# Patient Record
Sex: Female | Born: 1955 | Race: White | Hispanic: No | Marital: Married | Smoking: Former smoker
Health system: Southern US, Community
[De-identification: ages and names within clinical notes are randomized; demographics above are authoritative.]

## PROBLEM LIST (undated history)

## (undated) DIAGNOSIS — S81809A Unspecified open wound, unspecified lower leg, initial encounter: Secondary | ICD-10-CM

## (undated) DIAGNOSIS — Z9889 Other specified postprocedural states: Secondary | ICD-10-CM

## (undated) DIAGNOSIS — Z972 Presence of dental prosthetic device (complete) (partial): Secondary | ICD-10-CM

## (undated) DIAGNOSIS — R112 Nausea with vomiting, unspecified: Secondary | ICD-10-CM

## (undated) DIAGNOSIS — I739 Peripheral vascular disease, unspecified: Secondary | ICD-10-CM

## (undated) DIAGNOSIS — T7840XA Allergy, unspecified, initial encounter: Secondary | ICD-10-CM

## (undated) DIAGNOSIS — S91009A Unspecified open wound, unspecified ankle, initial encounter: Secondary | ICD-10-CM

## (undated) DIAGNOSIS — Z98811 Dental restoration status: Secondary | ICD-10-CM

## (undated) HISTORY — PX: ANKLE FRACTURE SURGERY: SHX122

## (undated) HISTORY — DX: Unspecified open wound, unspecified lower leg, initial encounter: S81.809A

## (undated) HISTORY — PX: HARDWARE REMOVAL: SHX979

## (undated) HISTORY — PX: VEIN LIGATION AND STRIPPING: SHX2653

## (undated) HISTORY — DX: Allergy, unspecified, initial encounter: T78.40XA

## (undated) HISTORY — PX: TONSILLECTOMY: SUR1361

## (undated) HISTORY — PX: OTHER SURGICAL HISTORY: SHX169

---

## 1994-06-02 HISTORY — PX: ECTOPIC PREGNANCY SURGERY: SHX613

## 2001-12-16 ENCOUNTER — Emergency Department (HOSPITAL_COMMUNITY): Admission: EM | Admit: 2001-12-16 | Discharge: 2001-12-16 | Payer: Self-pay | Admitting: Emergency Medicine

## 2007-03-24 ENCOUNTER — Encounter (HOSPITAL_BASED_OUTPATIENT_CLINIC_OR_DEPARTMENT_OTHER): Admission: RE | Admit: 2007-03-24 | Discharge: 2007-05-31 | Payer: Self-pay | Admitting: Surgery

## 2007-06-01 ENCOUNTER — Encounter (HOSPITAL_BASED_OUTPATIENT_CLINIC_OR_DEPARTMENT_OTHER): Admission: RE | Admit: 2007-06-01 | Discharge: 2007-08-12 | Payer: Self-pay | Admitting: Surgery

## 2010-07-04 ENCOUNTER — Other Ambulatory Visit: Payer: Self-pay | Admitting: General Surgery

## 2010-07-04 DIAGNOSIS — R1030 Lower abdominal pain, unspecified: Secondary | ICD-10-CM

## 2010-07-12 ENCOUNTER — Ambulatory Visit
Admission: RE | Admit: 2010-07-12 | Discharge: 2010-07-12 | Disposition: A | Discharge status: Home / Self Care | Payer: 59 | Source: Ambulatory Visit | Attending: General Surgery | Admitting: General Surgery

## 2010-07-12 DIAGNOSIS — R1030 Lower abdominal pain, unspecified: Secondary | ICD-10-CM

## 2010-07-12 MED ORDER — IOHEXOL 300 MG/ML  SOLN: 100.0000 mL | Freq: Once | INTRAMUSCULAR | Status: AC | PRN

## 2010-10-15 NOTE — Assessment & Plan Note (Signed)
[facility]   NAME:  [name], [name]               ACCOUNT NO.:  [id number]   MEDICAL RECORD NO.:  [id number]      DATE OF BIRTH:  [birth year]   PHYSICIAN:  Epifania Gore. Nils Pyle, M.D. VISIT DATE:  03/26/2007                                   OFFICE VISIT   REASON FOR CONSULTATION:  [name] is a 55 year old female, referred by  Dr. Renaldo Reel for evaluation of the recalcitrant ulcer of the left  lower extremity.   IMPRESSION:  Stasis ulcer.   RECOMMENDATIONS:  Continue external compression for wound preparation  followed by the placement of an Apligraf.   SUBJECTIVE:  [name] is a 55 year old who has had a long history of  venous disease associated with ulcerations.  Her history dates back to  2005.  She has had several ulcers which have responded to external  compression.  She has had vein stripping as well as sclerotherapy in the  management of her recurrent ulcerations.  She has also worn custom-  fitted hose.   The current ulcer has been present since April 2008.  She has been  treated with Unna wraps, oral and topical antibiotics, as well as  sonographically-directed sclera therapy.  In spite of these efforts, the  wound has continued to increase.  She is troubled by moderate pain and  immobility in the ankle, giving rise to instability and gait.  She she  has moderate pain which is well controlled with p.o. analgesia.  There  has been no interim trauma.   PAST MEDICAL HISTORY:  Remarkable for ALLERGIES TO PENICILLIN which  gives a hives.  Her current medication list includes  400 mg 1 t.i.d.,  vitamin C, fish oil daily, aspirin 81 mg daily and Percocet p.r.n. for  pain.   PAST SURGERIES:  Included a tubal pregnancy evacuation, a tonsillectomy,  and vein-stripping of the left lower extremity.   FAMILY HISTORY:  Positive for COPD, cancer and diabetes.   SOCIAL HISTORY:  She is married.  She has 2 stepchildren.  She lives in  [facility] and is  employed.   REVIEW OF SYSTEMS:  The patient is a smoker.  She does not have a  chronic cough.  She denies visual changes, speech impairments or  transient paralysis.  She has no heat or cold intolerance.  No  polydipsia, polyphagia, polyuria.  There are no cardiorespiratory  symptoms.  There are no GI, GU complaints.  The remainder of her review  of systems is negative.   PHYSICAL EXAMINATION:  She is an alert, oriented female in no acute  distress.  She is in good contact with reality and responds  appropriately to historical inquiry.  Blood pressure is 116/71, pulse rate of 56, respirations 18, temperature  is 97.7.  HEENT:  Exam is clear.  NECK:  Supple.  Trachea is midline.  Thyroid is nonpalpable.  LUNGS:  Clear.  HEART:  Sounds were normal.  ABDOMEN:  Soft.  EXTREMITY EXAM:  Abnormal with 2+ edema in the left lower extremity,  associated with moderate changes of stasis including hyperpigmentation.  The pedal pulse is 3+ bilaterally.  The patient retains protective  sensation as measured by the Semmes-Weinstein filament.  The ulceration  is in the classic stasis position,  and the left medial malleolus  measures 4 cm in its widest diameter and is approximately 0.2 cm deep.  It appears to be clean with a clear-serous drainage.  There is a halo of  erythema, but there is no ascending lymphangitis or cellulitis.  The  calf is nontender.   DISCUSSION:  [name] has a long history of a stasis ulceration,  treated with multiple modalities and pain-ablative therapy.  On her  physical exam, there is no evidence of ropy varicosities of venous  hypertension.  The wound appears to be normal although it has been  present for quite some time.  We will proceed with reapplying the Unna  boot we will request insurance precertification for Apligraf procedure.  We will reevaluate the patient in 1 week, and if there is stabilization  of the wound, we will proceed with Apligraf.  If there is  any  deterioration whatsoever from today's exam, we will proceed with a  biopsy to rule out concurrent malignancy.  We have explained this  approach to the patient in terms that she seems to understand.  She  expresses gratitude for having been seen in the clinic and indicates  that she will be compliant.  She will continue to wear the compressive  wrap, and will employee elevation of the leg to avoid edema as much as  possible      Harold A. Nils Pyle, M.D.  Electronically Signed     HAN/MEDQ  D:  03/26/2007  T:  03/27/2007  Job:  [id number]   cc:   Landry Corporal, M.D.

## 2010-10-15 NOTE — Consult Note (Signed)
NAMEMarland Kitchen  [name], [name]               ACCOUNT NO.:  [id number]   MEDICAL RECORD NO.:  [id number]          PATIENT TYPE:  REC   LOCATION:  FOOT                         FACILITY:  [facility]   PHYSICIAN:  Orlando Penner. Sevier, M.D. DATE OF BIRTH:  Oct 29, 1955   DATE OF CONSULTATION:  04/21/2007  DATE OF DISCHARGE:                                 CONSULTATION   HISTORY:  This 55 year old white female is being followed for a venous  ulceration just anterior to the medial malleolus on the medial aspect of  her right foot.  She has had venous ablation therapy in that area which  was performed by injection since the beginning of the ulceration.  That  did not result in much improvement, nor have standard treatments here,  and accordingly 2 weeks ago she had application of an Apligraf.  Things  were satisfactory a week ago when the initial partial redressing was  done and today the wound itself evaluated for the first time.  She  reports no increased pain, no increased drainage, no odor, no fever or  systemic symptoms.  She had complained about tightness in her Achilles  tendon as a result of longstanding wraps and since her visit here 2  weeks ago she has been using a sash or belt to passively put that ankle  through range of motion and reports that it is improving.   EXAMINATION:  Blood pressure 112/55, pulse 60, respirations 16,  temperature 97.5.  The ulcer on the left medial hind foot measures 1.7 x  3.5 x 0.1 cm which is essentially unchanged from before.  There is a  little bit of yellowish exudate in the wound base.  No definite  epithelial islands but possibly some epithelial advancement from the  margin of the wound in approximately the 8-9 o'clock position.   IMPRESSION:  A satisfactory course of stasis wound but uncertain benefit  from recent Apligraf.   DISPOSITION:  Because of the recent application of the Apligraf I am  loath to debride the base of the wound today until we can give it  a  little bit additional time for possible epithelialization.  Accordingly,  the wound is dressed with a Mepilex and a Telfa pad and the extremity  returned to an Unna wrap.   She is to be allowed to continue her heel cord stretching exercises.   Followup visit will be here in 12 days, sooner p.r.n.           ______________________________  Orlando Penner. London Pepper, M.D.     RES/MEDQ  D:  04/21/2007  T:  04/21/2007  Job:  [id number]

## 2010-10-15 NOTE — Assessment & Plan Note (Signed)
[facility]   NAME:  LITAL, ZAPP               ACCOUNT NO.:  [id number]   MEDICAL RECORD NO.:  [id number]      DATE OF BIRTH:  [birth year]   PHYSICIAN:  Epifania Gore. Nils Pyle, M.D. VISIT DATE:  05/17/2007                                   OFFICE VISIT   SUBJECTIVE:  [name] is a 55 year old female who we are following for  a stasis ulceration involving the left lower extremity.  The patient  underwent placement of an Apligraf several weeks ago followed by  compression wrap therapy and most recently she has had Prisma dressing  applied.  She is having a moderate amount of drainage with decreased  range of motion in the ankle.  There has been no fever.  She continues  to be ambulatory.  She has had an interim duplex scan by [facility] which has not shown deep venous thrombosis.   OBJECTIVE:  Blood pressure is 107/61, respirations are 20, pulse rate  70, temperature is 97.6.  Inspection of the left lower extremity shows  that there has been definite increase in the size of the wound, the  measurements are 3.0 cm in the greatest diameter.  There is involution  of the previous Apligraf with a hyperemic response.  The pedal pulse  remains 3+, there is associated 2+ edema.  There is no ascending  cellulitis or lymphangitis.   ASSESSMENT:  Stasis ulceration retrogression.   PLAN:  We have cultured the wound, we are placing the patient in an Unna  wrap.  We will reevaluate her in 7-10 days.  We have instructed her  specifically to keep the Unna wrap in place.  If she develops swelling  she is to raise the leg above her heart for 15 minutes, if there is no  relief she is to completely remove the wrap and call the clinic for a  priority appointment to reapply her wrap.  We have given the patient an  opportunity to ask questions, she seems to understand and indicates that  she will be compliant.  We have extended this patient's period of  disability until  after June 03, 2007.      Harold A. Nils Pyle, M.D.  Electronically Signed     HAN/MEDQ  D:  05/17/2007  T:  05/17/2007  Job:  [id number]

## 2010-10-15 NOTE — Assessment & Plan Note (Signed)
[facility]   NAME:  [name], [name]               ACCOUNT NO.:  [id number]   MEDICAL RECORD NO.:  [id number]      DATE OF BIRTH:  01/05/1956   PHYSICIAN:  [name], M.D. VISIT DATE:  04/02/2007                                   OFFICE VISIT   DESCRIPTION OF TODAY'S VISIT:  [name] is a 55 year old woman who is  seen today for evaluation of a recalcitrant ulcer on her left lower  extremity.  She was seen here on October 24.  She underwent external  compression Silverlon.  She has tolerated this reasonably well.  She  also underwent prior approval for an Apligraf.  We have this present.  She will have $100 copay, but otherwise, this will be covered, and I  have discussed this with her.   PHYSICAL EXAMINATION:  VITAL SIGNS:  Temperature is 98.5, pulse 58,  respirations 16, blood pressure 116/57.  She is not a diabetic.  The  area in question is on her left medial ankle.  Measuring 1.7 x 3.3 x  0.1.  This is somewhat improved from last time.  The base of this looks  granulated.  There is scant epithelialization present and not excessive  amounts of slough.  There is erythema around the wound that seems new  from last time.  However, this was not tender, not warm.  She wondered  whether this could be a reaction to the Silverlon.  Her peripheral  pulses are intact.  She discussed in detail the treatment that has been  prescribed through the vein center and the physician that was treating  her there.   IMPRESSION:  1. Predominately refractory venous stasis ulcer.  In view of the      possible reaction to the Silverlon, I have put Chromogranin the      base this wound and reapplied an Unna wrap.  We will proceed with      an Apligraf order which should be done in the early part of next      week.  I have discussed this in detail with the patient.  We will      call her at that time for her Apligraf application next week.     ______________________________  [name], M.D.     MGR/MEDQ  D:  04/02/2007  T:  04/02/2007  Job:  [id number]

## 2010-10-15 NOTE — Assessment & Plan Note (Signed)
[facility]   NAME:  [name], [name]               ACCOUNT NO.:  [id number]   MEDICAL RECORD NO.:  [id number]      DATE OF BIRTH:  1956-03-29   PHYSICIAN:  Epifania Gore. Nils Pyle, M.D. VISIT DATE:  07/19/2007                                   OFFICE VISIT   SUBJECTIVE:  [name] is a 55 year old lady who we have followed in the  [place] since October 2008.  We have treated her with for a stasis  ulceration involving the left medial ankle.  She has been treated with  continuous adequate external compression with initial improvement.  She  has had cultures which have been specifically treated with antibiotics  and most recently she has undergone an Apligraf placement with initial  decrease in the area of the ulcer.  She was returned to work 2 weeks ago  with an instruction to continue external compression.  She reported some  discomfort at work but persisted.  She continues to wear external  compression.  There has been no interim fever.  There is been no  excessive drainage.   OBJECTIVE:  Blood pressure is 102/75, respirations 16, pulse rate 54,  temperature is 98.  Inspection of the left lower extremity shows that the ulcer itself has  increased in area.  There remains a granulating base with some scant  appearance of epithelium.  There is no ascending infection.  There is no  malodor. There are no signs of ischemia.  There is no need for  debridement.   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   RECOMMENDATIONS:  We have instructed the patient to make a follow-up  appointment with Dr. Renaldo Reel for evaluation of the venous  hypertension attributed to incompetent perforations. I have discussed  this patient with Dr. Renaldo Reel today. We have suggested to the  patient that she may need further ablative in an effort to control her  venous hypertension.  Our efforts to  date have shown that she clinicaly  responsed to external compression but this response is not durable in  the context of her employment requirements.  We have given her an  opportunity to ask questions.  She seems to understand our assessment  and has indicated that she will requests the followup return with Dr.  Renaldo Reel.  In the interim, if her the compressive wrap becomes soiled  or uncomfortable,  she is to call the [facility].  We will be happy to  change her dressing and evaluate her until she can be seen again by Dr.  Renaldo Reel.  The patient expresses gratitude for having been seen in  the clinic.  We are discharging her from active management in the [facility] at this time. Her care is to be re-assumed by Dr. Renaldo Reel.      Harold A. Nils Pyle, M.D.  Electronically Signed     HAN/MEDQ  D:  07/19/2007  T:  07/19/2007  Job:  [id number]   cc:   Landry Corporal, M.D.

## 2010-10-15 NOTE — Assessment & Plan Note (Signed)
[facility]   NAME:  RYLIEE, MEHALL               ACCOUNT NO.:  [id number]   MEDICAL RECORD NO.:  [id number]      DATE OF BIRTH:  04-Mar-1956   PHYSICIAN:  Epifania Gore. Nils Pyle, M.D. VISIT DATE:  05/24/2007                                   OFFICE VISIT   SUBJECTIVE:  Ms. Benevides is a 55 year old lady who we are treating for a  stasis ulcer.  In the interim she has worn an Haematologist.  She has  complained of some cramping. She remains ambulatory.   OBJECTIVE:  Blood pressure 103/61, respirations 18, pulse rate 55,  temperature is 98.2.  Inspection of the left medial ankle shows that the ulcer has decreased  in volume.  There is a minor area of ecchymosis at the proximal portion  of the Unna wrap.  There is no violation of the skin, however.  There is  no evidence of wrap injury. The pedal pulse remains palpable.   ASSESSMENT:  Clinical response to adequate compression of the stasis  ulcer.   PLAN:  We will resume an Unna boot compression therapy and reevaluate  the patient in 1 week p.r.n.      Harold A. Nils Pyle, M.D.  Electronically Signed     HAN/MEDQ  D:  05/24/2007  T:  05/24/2007  Job:  [id number]

## 2010-10-15 NOTE — Assessment & Plan Note (Signed)
[facility]   NAME:  [name], [name]               ACCOUNT NO.:  [id number]   MEDICAL RECORD NO.:  [id number]      DATE OF BIRTH:  [birth year]   PHYSICIAN:  Orlando Penner. Sevier, M.D.  VISIT DATE:  04/07/2007                                   OFFICE VISIT   HISTORY:  This 55 year old white female is seen for refractory venous  ulcer on the medial aspect of the left heel just distal to the lateral  malleolar area.  This has been treated with applications of Unna wrap  and other topical measures and has come to a point of stability with the  plan out to move to Apligraf application.   The patient reports no significant change that she can see in the  interim.  She does note that she is experiencing some tightening of the  Achilles tendon on that side in conjunction with her wraps.   EXAMINATION:  Today blood pressure 98/58, pulse 63 regular, respirations  16, temperature 98.4.  Indeed there is some loss of extension in the tendo Achilles on the left  which can only partially be passively overcome at this point. The  previously described ulcer is seen today 3.2 x 1.7 x 0.1 cm in dimension  and has a nice clean granular base.  There is some crusting around the  margins.   IMPRESSION:  Slow improvement and stability of wound with wound base  ready for Apligraf application.   DISPOSITION:  1. The crusts around the margin of the wound are selectively debrided      without difficulty with no bleeding.  2. Using standard and sterile procedures the Apligraf is applied to      the wound and anchored in place with Steri-Strips.  It is covered      with Mepilex dressing and extremity is returned to Profore light      wrap.   The patient is instructed how to use a belt or sash of some sort to  place under her forefoot when she is sitting inactive and to gradually  work on dorsiflexing the foot passively in that manner so as to lengthen  the tendo Achilles.   Follow-up  visit will be here in 5 days for initial wound redressing and  then in 7 days thereafter for wound unveiling.           ______________________________  Orlando Penner. London Pepper, M.D.     RES/MEDQ  D:  04/07/2007  T:  04/07/2007  Job:  [id number]

## 2010-10-15 NOTE — Assessment & Plan Note (Signed)
[facility]   NAME:  ISAI, ATANACIO               ACCOUNT NO.:  [id number]   MEDICAL RECORD NO.:  [id number]      DATE OF BIRTH:  [birth year]   PHYSICIAN:  Epifania Gore. Nils Pyle, M.D. VISIT DATE:  07/12/2007                                   OFFICE VISIT   SUBJECTIVE:  Ms. Esser is a 55 year old female who we have followed for  a stasis ulcer involving her left medial leg.  In the interim she has  returned to work.  She has had mild pain requiring two Vicodin over the  last week.  She did complain of some minor swelling which receded  overnight.  There has been no fever.  There has been no excessive  drainage or malodor.   OBJECTIVE:  VITALS:  Blood pressure is 109/56, respirations 16, pulse  rate 55, temperature 98.3.  EXTREMITIES:  Inspection of the left medial ankle shows that the ulcer  is clean, but there is no significant epithelial advancement.  There is  no evidence of active infection.  The pedal pulse remains palpable.   ASSESSMENT:  Static stasis ulcer.   PLAN:  We will return the patient to an Haematologist.  She is encouraged to  continue her work as tolerated.  If this wound shows no improvement over  the next week. we will refer her for vascular surgery evaluation of a  deeper system with attention toward the possible consideration of vein  ablation therapy.  We will reevaluate the patient in 1 week.      Harold A. Nils Pyle, M.D.  Electronically Signed     HAN/MEDQ  D:  07/12/2007  T:  07/12/2007  Job:  [id number]

## 2010-10-15 NOTE — Assessment & Plan Note (Signed)
[facility]   NAME:  MARIJO, BARTEN               ACCOUNT NO.:  [id number]   MEDICAL RECORD NO.:  [id number]      DATE OF BIRTH:  1956/02/06   PHYSICIAN:  Epifania Gore. Nils Pyle, M.D. VISIT DATE:  07/05/2007                                   OFFICE VISIT   SUBJECTIVE:  Ms. Anselmo is a 55 year old female who we have followed for  stasis ulcer for several weeks.  In the interim she has been wearing an  Unna wrap.  She continues to be ambulatory.  There has been no excessive  drainage, malodor, pain or fever.   OBJECTIVE:  VITALS:  Blood pressure is 102/54, respirations 16, pulse  rate 52, temperature is 98.1.  Inspection of the left medial ankle shows  that the ulcer is 100% granulated with healthy appearing periphery and  advancing epithelium.  There is no evidence of ascending infection.  Pedal pulses palpable.   ASSESSMENT:  Satisfactory response to compression.   PLAN:  We are returning the patient to work as of 07/05/07.  She will  require weekly follow-ups for Unna boot changes.  We have given her a  prescription for Vicodin to take 1-2 every 6-8 hours p.r.n. for pain.  She is to keep her leg elevated as much as possible. If she has  difficulty with work, she will return for an interim evaluation and  reevaluation of her work status.      Harold A. Nils Pyle, M.D.  Electronically Signed     HAN/MEDQ  D:  07/05/2007  T:  07/05/2007  Job:  [id number]

## 2010-10-15 NOTE — Assessment & Plan Note (Signed)
[facility]   NAME:  [name], [name]               ACCOUNT NO.:  [id number]   MEDICAL RECORD NO.:  [id number]      DATE OF BIRTH:  [birth year]   PHYSICIAN:  Epifania Gore. Nils Pyle, M.D. VISIT DATE:  06/21/2007                                   OFFICE VISIT   SUBJECTIVE:  [name] is a 55 year old female who we are following for  a stasis ulcer on the medial aspect of her left lower extremity.  In the  interim, she continues to be ambulatory.  She is wearing an Haematologist.  There has been no excessive drainage, malodor, pain, or fever.   OBJECTIVE:  Blood pressure is 114/84, respirations are 16, pulse rate  55, temperature is 98.4.  Inspection of the left lower extremity shows a  well perfused extremity with a contracting stasis ulcer.  There is no  excessive drainage or malodor.  There is no evidence of infection.  The  edema is 1+. ( refer to the data entry)   ASSESSMENT:  Improved stasis ulcer.   PLAN:  We will reapply an Unna boot and reevaluate the patient in 1  week.      Harold A. Nils Pyle, M.D.  Electronically Signed     HAN/MEDQ  D:  06/21/2007  T:  06/21/2007  Job:  [id number]

## 2010-10-15 NOTE — Assessment & Plan Note (Signed)
[facility]   NAME:  [name], [name]               ACCOUNT NO.:  [id number]   MEDICAL RECORD NO.:  [id number]      DATE OF BIRTH:  [birth year]   PHYSICIAN:  Epifania Gore. Nils Pyle, M.D. VISIT DATE:  06/14/2007                                   OFFICE VISIT   SUBJECTIVE:  [name] returns for followup with a stasis ulcer  involving the left medial ankle.  In the interim she has worn an IT consultant.  There has been no excessive drainage, malodor, pain or fever.   OBJECTIVE:  Blood pressure is 96/44, respirations 16, pulse rate 57,  temperature 98.1.  Inspection of the medical aspect of the right lower  extremity shows that there is a moderate amount of desquamation,  necrosis in the periphery.  Under EMLA block this wound was sharply  debrided, an excisional debridement was performed using both a curette  and a 10 blade.  Subcutaneous tissue, skin necrosis and desquamation  were removed without difficulty.  Hemorrhage was controlled with direct  pressure.  The wound was irrigated and an Haematologist was re-applied.   ASSESSMENT:  Clinical improvement with compression therapy.   PLAN:  We will continue the patient in compression hose and re-evaluate  her in 1 week.      Harold A. Nils Pyle, M.D.  Electronically Signed     HAN/MEDQ  D:  06/14/2007  T:  06/14/2007  Job:  [id number]

## 2010-10-15 NOTE — Assessment & Plan Note (Signed)
[facility]   NAME:  [name], [name]               ACCOUNT NO.:  [id number]   MEDICAL RECORD NO.:  [id number]      DATE OF BIRTH:  May 14, 1956   PHYSICIAN:  Epifania Gore. Nils Pyle, M.D. VISIT DATE:  05/31/2007                                   OFFICE VISIT   SUBJECTIVE:  [name] is a 55 year old female who we are following for  a left medial stasis ulceration.  In the interim, we have received and  reviewed reports from Dr. Linus Mako.  The patient reports said  there has been less drainage.  She continues to be ambulatory.  There  has been no excessive pain or fever.   OBJECTIVE:  Blood pressure is 111/48, respirations 16, pulse rate 50,  temperature 97.8.  Inspection of the left medial ankle shows that there is some decrease in  the diameter of the wound.  There is 100% granulation.  There are scanty  areas of desquamation at the periphery, no debridement is needed.  Pedal  pulses are readily palpable.   ASSESSMENT:  Clinical improvement, stasis ulcer responding to  compression.   PLAN:  We will return the patient to an Haematologist.  We will reevaluate  her in 1 week.      Harold A. Nils Pyle, M.D.  Electronically Signed     HAN/MEDQ  D:  05/31/2007  T:  05/31/2007  Job:  [id number]

## 2010-10-15 NOTE — Assessment & Plan Note (Signed)
[facility]   NAME:  VEVERLY, TRENTACOSTE               ACCOUNT NO.:  [id number]   MEDICAL RECORD NO.:  [id number]      DATE OF BIRTH:  11/20/1955   PHYSICIAN:  Epifania Gore. Nils Pyle, M.D. VISIT DATE:  06/07/2007                                   OFFICE VISIT   SUBJECTIVE:  The patient is a 55 year old lady who we are following for  stasis ulcer involving the medial aspect of the left lower extremity.  In the interim we have treated her with an Haematologist.  She continues to  be ambulatory but does have some moderate pain.   OBJECTIVE:  Blood pressure is 104/48, respirations 16, pulse rate 63,  temperature 97.8.  Inspection of the left medial ankle shows that the  wound itself is decreased in area and in volume.  There is no evidence  of active infection or ischemia.   ASSESSMENT:  Clinical improvement.  Patient responding to compression.   PLAN:  We have reapplied an Unna wrap.  We will place her in an aqua  cell silver and reevaluate her in 1 week.   We have filled out a continuing disability claim stating her date of  disability from March 26, 2007 (the day she was first seen in the  wound center).  We anticipate that this patient should be able to return  to work on July 04, 2007.  She will require weekly evaluations to  assess her progress.  She has an excellent prognosis for complete  resolution of the ulcer.      Harold A. Nils Pyle, M.D.  Electronically Signed     HAN/MEDQ  D:  06/07/2007  T:  06/07/2007  Job:  [id number]

## 2010-10-15 NOTE — Assessment & Plan Note (Signed)
[facility]   NAME:  [name], [name]               ACCOUNT NO.:  [id number]   MEDICAL RECORD NO.:  [id number]      DATE OF BIRTH:  01/02/1956   PHYSICIAN:  Epifania Gore. Nils Pyle, M.D. VISIT DATE:  05/03/2007                                   OFFICE VISIT   SUBJECTIVE:  [name] is a 55 year old female with a recalcitrant  stasis ulcer who returns following an Apligraf.  She has had the  Apligraf placed 2 weeks ago.  There has been no excessive drainage  malodor pain or fever.  She continues to be ambulatory.   OBJECTIVE:  Blood pressure is 108/62, respirations 16, pulse rate 60,  temperature is 98.5.  Inspection of the left medial ankle shows that the  ulcer itself has contracted.  There is advancing epithelium from the  periphery.  There is no evidence of a ascending infection or vascular  compromise.   ASSESSMENT:  Satisfactory clinical response to Apligraf.   PLAN:  We are returning the patient to an Haematologist.  We will reevaluate  her in 1 week.  We have given the patient an additional 2 weeks  abstinence from work to avoid complications related to dependent edema.      Harold A. Nils Pyle, M.D.  Electronically Signed     HAN/MEDQ  D:  05/03/2007  T:  05/03/2007  Job:  [id number]

## 2010-10-15 NOTE — Consult Note (Signed)
NAMEMarland Kitchen  [name], [name]               ACCOUNT NO.:  [id number]   MEDICAL RECORD NO.:  [id number]          PATIENT TYPE:  REC   LOCATION:  FOOT                         FACILITY:  [facility]   PHYSICIAN:  Orlando Penner. Sevier, M.D. DATE OF BIRTH:  [birth year]   DATE OF CONSULTATION:  05/10/2007  DATE OF DISCHARGE:                                 CONSULTATION   HISTORY:  This 55 year old white female has been followed for a fairly  refractory left medial malleolar area stasis ulceration.  After getting  the wound cleaned up in early November 2008, she underwent application  of an Apligraf.  Although initial progress following that was slow, she  has may great progress over the preceding week, with considerable  reduction in wound volume and with evidence for epithelialization.  She  is quite pleased with the appearance of things.  She has had no  awareness of increased drainage, increased odor, or swelling.  There is  some periwound redness which she thinks is unchanged, and she has had no  fever or systemic symptoms.   PHYSICAL EXAMINATION:  VITAL SIGNS:  Blood pressure 97/56, pulse 53,  respirations 16, temperature 98.2.  SKIN:  The wound on the medial malleolar area of the left ankle now  measures 1.4 x 2.6 x 0.1 cm.  The wound base is granular, with minimal  yellow exudate, but with clearly some areas of epithelial advancement,  particularly on the distal margin of the wound, and some epithelial  island formation toward the anterior aspect of the wound.   IMPRESSION:  Chronic stasis ulceration left medial malleolar area, with  significant improvement.   DISPOSITION:  The wound is gently cleansed of the exudate using a Q-tip  and is then dressed with an application of a Prisma dressing covered by  a nonstick Telfa pad, and that extremity is replaced in an Unna wrap.  The patient will be seen in 1 week for change of dressings by her nurse  and then will be seen by the physician in 2  weeks.           ______________________________  Orlando Penner London Pepper, M.D.     RES/MEDQ  D:  05/10/2007  T:  05/10/2007  Job:  [id number]

## 2010-10-15 NOTE — Assessment & Plan Note (Signed)
[facility]   NAME:  [name]               ACCOUNT NO.:  [id number]   MEDICAL RECORD NO.:  [id number]      DATE OF BIRTH:  [birth year]   PHYSICIAN:  Epifania Gore. Nils Pyle, M.D. VISIT DATE:  04/12/2007                                   OFFICE VISIT   SUBJECTIVE:  [name] is a 55 year old female who we have treated  for a stasis ulcer utilizing an Apligraf.  The Apligraf was placed one  week ago.  She returns for follow-up.  There is been no excessive  drainage, malodor, pain or fever.   OBJECTIVE:  The blood pressure is 98/62, respirations 14, pulse rate 62,  temperature 98.2.  Inspection of the wound shows that the Apligraf is  well covered by the Mepitel dressing.  There is no excessive drainage  and there is no evidence of active infection.   ASSESSMENT:  Satisfactory postoperative result.   PLAN:  We will reevaluate the patient in one week.  We will replace a  Profore Lite.      Harold A. Nils Pyle, M.D.  Electronically Signed     HAN/MEDQ  D:  04/12/2007  T:  04/12/2007  Job:  [id number]

## 2010-10-15 NOTE — Assessment & Plan Note (Signed)
[facility]   NAME:  [name], [name]               ACCOUNT NO.:  [id number]   MEDICAL RECORD NO.:  [id number]      DATE OF BIRTH:  1956/04/04   PHYSICIAN:  Epifania Gore. Nils [name], M.D. VISIT DATE:  06/28/2007                                   OFFICE VISIT   SUBJECTIVE:  [name] is a 55 year old female who we have followed for  stasis ulcer involving the left medial ankle.  In the interim she has  worn an Unna wrap.  She returns for follow-up.  There is been moderate  drainage, no malodor, no pain and no fever.   OBJECTIVE:  Blood pressure is 125/44, respirations 16, pulse rate 57,  temperature 98.3.  Inspection of the left medial ankle shows that there is trace edema.  The ulcer itself shows evidence of contraction.  There is moderate  amount of desquamation and frank necrosis at the periphery extending  toward the central portion of the wound.  There is some moisture but  there is no maceration.  There is no malodor.  The pedal pulse remains  palpable.  There is no evidence of ischemia.  Under topical anesthetic  the wound was debrided utilizing a 10 blade with excision of skin  necrosis and chronic inflammatory tissue.  The patient tolerated  procedure well.   ASSESSMENT:  Adequately debrided stasis ulcer.   PLAN:  We will return the patient to a an Unna wrap and reevaluate her  in 1 week.      [name], M.D.  Electronically Signed     HAN/MEDQ  D:  06/28/2007  T:  06/28/2007  Job:  [id number]

## 2012-05-18 ENCOUNTER — Encounter (HOSPITAL_BASED_OUTPATIENT_CLINIC_OR_DEPARTMENT_OTHER): Payer: 59 | Attending: General Surgery

## 2012-05-18 DIAGNOSIS — L97909 Non-pressure chronic ulcer of unspecified part of unspecified lower leg with unspecified severity: Secondary | ICD-10-CM | POA: Insufficient documentation

## 2012-05-18 DIAGNOSIS — I87319 Chronic venous hypertension (idiopathic) with ulcer of unspecified lower extremity: Secondary | ICD-10-CM | POA: Insufficient documentation

## 2012-05-18 NOTE — H&P (Signed)
NAME:  [name], [name]               ACCOUNT NO.:  [id number]  MEDICAL RECORD NO.:  [id number]  LOCATION:  FOOT                         FACILITY:  [facility]  PHYSICIAN:  Elesa Hacker, M.D.        DATE OF BIRTH:  11-Dec-1955  DATE OF ADMISSION:  05/18/2012 DATE OF DISCHARGE:                             HISTORY & PHYSICAL   CHIEF COMPLAINT:  Wound, left medial ankle.  HISTORY OF PRESENT ILLNESS:  This is a 56 year old female with long history of venous hypertension.  She has been treated for several months by Dr. Renaldo Reel at the [facility] and has had several ablations of perforators.  The wound has been treated first with an Unna boot and lately with wraps.  This is the fourth or fifth recurrence of a wound in the same location.  PAST MEDICAL HISTORY:  Essentially negative for venous hypertension.  PAST SURGICAL HISTORY:  She has had a tubal ligation.  SOCIAL HISTORY:  Cigarettes; none for 5 years.  Alcohol; occasionally beer.  MEDICATIONS:  Percocet and Advil.  ALLERGIES:  PENICILLIN causes hives.  REVIEW OF SYSTEMS:  As above.  PHYSICAL EXAMINATION:  VITAL SIGNS:  Temperature 99, pulse 48, respirations 16, blood pressure 116/68. GENERAL APPEARANCE:  Well developed, slender in no distress. HEAD, EYES, EARS, NOSE, THROAT:  Normal. NECK:  Supple. CHEST:  Clear. HEART:  Regular rhythm. ABDOMEN:  Not examined. EXTREMITIES:  Examination of the left lower extremity reveals a bounding pulse.  There is a 0.4 x 0.6 ulceration near the left medial ankle, this is not over the bone.  The base is relatively clean.  Some slough was cleared by the curette.  IMPRESSION:  Venous hypertension with ulcer.  PLAN OF TREATMENT:  We will start with Endoform and Unna boot, and we will see her in approximately 2 weeks because of the Christmas holidays.     Elesa Hacker, M.D.     RA/MEDQ  D:  05/18/2012  T:  05/18/2012  Job:  [id number]  cc:   Landry Corporal, M.D.

## 2012-06-08 ENCOUNTER — Encounter (HOSPITAL_BASED_OUTPATIENT_CLINIC_OR_DEPARTMENT_OTHER): Payer: 59 | Attending: General Surgery

## 2012-06-08 DIAGNOSIS — L97909 Non-pressure chronic ulcer of unspecified part of unspecified lower leg with unspecified severity: Secondary | ICD-10-CM | POA: Insufficient documentation

## 2012-06-08 DIAGNOSIS — I87319 Chronic venous hypertension (idiopathic) with ulcer of unspecified lower extremity: Secondary | ICD-10-CM | POA: Insufficient documentation

## 2012-06-22 ENCOUNTER — Encounter (HOSPITAL_BASED_OUTPATIENT_CLINIC_OR_DEPARTMENT_OTHER): Payer: 59

## 2012-12-09 ENCOUNTER — Encounter (HOSPITAL_BASED_OUTPATIENT_CLINIC_OR_DEPARTMENT_OTHER): Payer: 59 | Attending: Internal Medicine

## 2012-12-09 DIAGNOSIS — L97309 Non-pressure chronic ulcer of unspecified ankle with unspecified severity: Secondary | ICD-10-CM | POA: Insufficient documentation

## 2012-12-09 DIAGNOSIS — I87319 Chronic venous hypertension (idiopathic) with ulcer of unspecified lower extremity: Secondary | ICD-10-CM | POA: Insufficient documentation

## 2012-12-09 DIAGNOSIS — L97909 Non-pressure chronic ulcer of unspecified part of unspecified lower leg with unspecified severity: Secondary | ICD-10-CM | POA: Insufficient documentation

## 2012-12-09 DIAGNOSIS — I872 Venous insufficiency (chronic) (peripheral): Secondary | ICD-10-CM | POA: Insufficient documentation

## 2012-12-09 DIAGNOSIS — Z87891 Personal history of nicotine dependence: Secondary | ICD-10-CM | POA: Insufficient documentation

## 2012-12-10 NOTE — Progress Notes (Signed)
[facility]  NAME:  [name], [name]               ACCOUNT NO.:  [id number]  MEDICAL RECORD NO.:  [id number]      DATE OF BIRTH:  [birth year]  PHYSICIAN:  [name], M.D. VISIT DATE:  12/09/2012                                  OFFICE VISIT   CHIEF COMPLAINT:  Here for wounds on her bilateral lower extremities.  HISTORY OF PRESENT ILLNESS:  [name] is now a 57 year old woman who has significant history of chronic venous insufficiency.  In fact, she has actually been seen in this clinic previously and she has also undergone extensive treatment for her chronic venous disease.  She has previously had ultrasound-guided endovenous chemical ablation for the substance of varicosities in her left leg.  She had a wound on her left medial ankle last year and was here for a 3-4 week timeframe, which resolved.  She now returns with a history of recurrent wound in the left medial ankle region for the last 3 weeks and another one on the right medial ankle region for the last week.  She saw Dr. Renaldo Reel of Venovascular Surgery.  She has not had any trauma.  She is not a diabetic.  She continues to work  and therefore is on her feet for most of the day.  She has required to use protective foot wear.  She has been using topical Bactroban to both these areas.  She has been and has continued to be compliant with her graded compression stockings.  PAST MEDICAL HISTORY:  Severe venous insufficiency, chronic venous hypertension, and extensive past history of previous venous treatment. She has had prior venous surgery of both her lower extremities.  She has had chemical ablation for refluxing-type 4 varicosities.  She has had apparently arterial Dopplers done, which showed no evidence of arterial insufficiency.  She is currently a nonsmoker having quit 5 years ago.  PHYSICAL EXAMINATION:  VITAL SIGNS:  Temperature 97.8, pulse 55, respirations 16, blood pressure 110/62,  weight is 130 pounds. RESPIRATORY:  Clear air entry bilaterally. CARDIAC:  Heart sounds are normal.  There is no murmurs.  No signs of CHF. EXTREMITIES:  Peripheral pulses are palpable.  Her ABI on the left is 1.2.  It could not be obtained on the right.  WOUND EXAM:  There is mild edema of her right greater than left leg, however, no evidence of a DVT.  She has atrophy blanche in the area inferior to the medial malleolus on the right.  There is also some subset of ulcer on the left medial ankle with more depth measuring 2 x 2 x 0.2.  Both of these areas were anesthetized with topical lidocaine and underwent a surface non-selective debridement using a curette. Unfortunately, more debridement is likely to be needed.  There is no significant evidence of ischemia and/or cellulitis.  No cultures were done.  I do not believe she has any evidence of PAD.  IMPRESSION:  Chronic and recurrent bilateral lower extremity ulcerations.  These are extensive bilaterally.  Both of them are going to need further debridement.  We addressed these today with Santyl, Hydrogel, silver alginate under a Kerlix Coban wrap bilaterally.  I did write work excuses for the next 2-1/2 weeks or so at least.  These are going to  need that much time in order to undergo successful debridement and then hopefully progressing towards healing.  We will see her again in a week's time.          ______________________________ [name], M.D.     MGR/MEDQ  D:  12/09/2012  T:  12/10/2012  Job:  [id number]

## 2013-01-06 ENCOUNTER — Encounter (HOSPITAL_BASED_OUTPATIENT_CLINIC_OR_DEPARTMENT_OTHER): Payer: 59 | Attending: Internal Medicine

## 2013-01-06 DIAGNOSIS — I872 Venous insufficiency (chronic) (peripheral): Secondary | ICD-10-CM | POA: Insufficient documentation

## 2013-01-06 DIAGNOSIS — L97409 Non-pressure chronic ulcer of unspecified heel and midfoot with unspecified severity: Secondary | ICD-10-CM | POA: Insufficient documentation

## 2013-02-03 ENCOUNTER — Encounter (HOSPITAL_BASED_OUTPATIENT_CLINIC_OR_DEPARTMENT_OTHER): Payer: 59 | Attending: Internal Medicine

## 2013-02-03 DIAGNOSIS — I872 Venous insufficiency (chronic) (peripheral): Secondary | ICD-10-CM | POA: Insufficient documentation

## 2013-02-03 DIAGNOSIS — L97409 Non-pressure chronic ulcer of unspecified heel and midfoot with unspecified severity: Secondary | ICD-10-CM | POA: Insufficient documentation

## 2013-03-03 ENCOUNTER — Encounter (HOSPITAL_BASED_OUTPATIENT_CLINIC_OR_DEPARTMENT_OTHER): Payer: 59 | Attending: Internal Medicine

## 2013-03-03 DIAGNOSIS — L97909 Non-pressure chronic ulcer of unspecified part of unspecified lower leg with unspecified severity: Secondary | ICD-10-CM | POA: Insufficient documentation

## 2013-03-03 DIAGNOSIS — I87319 Chronic venous hypertension (idiopathic) with ulcer of unspecified lower extremity: Secondary | ICD-10-CM | POA: Insufficient documentation

## 2013-04-04 ENCOUNTER — Encounter (HOSPITAL_BASED_OUTPATIENT_CLINIC_OR_DEPARTMENT_OTHER): Payer: 59 | Attending: Plastic Surgery

## 2013-04-04 DIAGNOSIS — I872 Venous insufficiency (chronic) (peripheral): Secondary | ICD-10-CM | POA: Insufficient documentation

## 2013-04-04 DIAGNOSIS — L97309 Non-pressure chronic ulcer of unspecified ankle with unspecified severity: Secondary | ICD-10-CM | POA: Insufficient documentation

## 2013-05-05 ENCOUNTER — Encounter (HOSPITAL_BASED_OUTPATIENT_CLINIC_OR_DEPARTMENT_OTHER): Payer: 59 | Attending: Internal Medicine

## 2013-05-05 DIAGNOSIS — I872 Venous insufficiency (chronic) (peripheral): Secondary | ICD-10-CM | POA: Insufficient documentation

## 2013-05-05 DIAGNOSIS — L97309 Non-pressure chronic ulcer of unspecified ankle with unspecified severity: Secondary | ICD-10-CM | POA: Insufficient documentation

## 2013-06-09 ENCOUNTER — Encounter (HOSPITAL_BASED_OUTPATIENT_CLINIC_OR_DEPARTMENT_OTHER): Payer: 59 | Attending: Internal Medicine

## 2013-06-09 DIAGNOSIS — L089 Local infection of the skin and subcutaneous tissue, unspecified: Secondary | ICD-10-CM | POA: Insufficient documentation

## 2013-06-09 DIAGNOSIS — L97309 Non-pressure chronic ulcer of unspecified ankle with unspecified severity: Secondary | ICD-10-CM | POA: Insufficient documentation

## 2013-06-09 DIAGNOSIS — I872 Venous insufficiency (chronic) (peripheral): Secondary | ICD-10-CM | POA: Insufficient documentation

## 2013-07-07 ENCOUNTER — Encounter (HOSPITAL_BASED_OUTPATIENT_CLINIC_OR_DEPARTMENT_OTHER): Payer: 59 | Attending: Internal Medicine

## 2013-07-07 DIAGNOSIS — L97309 Non-pressure chronic ulcer of unspecified ankle with unspecified severity: Secondary | ICD-10-CM | POA: Insufficient documentation

## 2013-07-07 DIAGNOSIS — I872 Venous insufficiency (chronic) (peripheral): Secondary | ICD-10-CM | POA: Insufficient documentation

## 2013-08-04 ENCOUNTER — Encounter (HOSPITAL_BASED_OUTPATIENT_CLINIC_OR_DEPARTMENT_OTHER): Payer: 59 | Attending: Internal Medicine

## 2013-08-04 DIAGNOSIS — L97309 Non-pressure chronic ulcer of unspecified ankle with unspecified severity: Secondary | ICD-10-CM | POA: Insufficient documentation

## 2013-08-04 DIAGNOSIS — L089 Local infection of the skin and subcutaneous tissue, unspecified: Secondary | ICD-10-CM | POA: Insufficient documentation

## 2013-08-04 DIAGNOSIS — I872 Venous insufficiency (chronic) (peripheral): Secondary | ICD-10-CM | POA: Insufficient documentation

## 2013-09-01 ENCOUNTER — Encounter (HOSPITAL_BASED_OUTPATIENT_CLINIC_OR_DEPARTMENT_OTHER): Payer: 59 | Attending: Internal Medicine

## 2013-09-01 DIAGNOSIS — I872 Venous insufficiency (chronic) (peripheral): Secondary | ICD-10-CM | POA: Insufficient documentation

## 2013-09-01 DIAGNOSIS — L97309 Non-pressure chronic ulcer of unspecified ankle with unspecified severity: Secondary | ICD-10-CM | POA: Insufficient documentation

## 2013-10-06 ENCOUNTER — Encounter (HOSPITAL_BASED_OUTPATIENT_CLINIC_OR_DEPARTMENT_OTHER): Payer: 59 | Attending: Internal Medicine

## 2013-10-06 DIAGNOSIS — L97309 Non-pressure chronic ulcer of unspecified ankle with unspecified severity: Secondary | ICD-10-CM | POA: Diagnosis not present

## 2013-10-06 DIAGNOSIS — I872 Venous insufficiency (chronic) (peripheral): Secondary | ICD-10-CM | POA: Insufficient documentation

## 2013-10-13 DIAGNOSIS — I872 Venous insufficiency (chronic) (peripheral): Secondary | ICD-10-CM | POA: Diagnosis not present

## 2013-11-03 ENCOUNTER — Encounter (HOSPITAL_BASED_OUTPATIENT_CLINIC_OR_DEPARTMENT_OTHER): Payer: 59 | Attending: Internal Medicine

## 2013-11-03 DIAGNOSIS — Z87891 Personal history of nicotine dependence: Secondary | ICD-10-CM | POA: Insufficient documentation

## 2013-11-03 DIAGNOSIS — L97909 Non-pressure chronic ulcer of unspecified part of unspecified lower leg with unspecified severity: Secondary | ICD-10-CM | POA: Insufficient documentation

## 2013-11-03 DIAGNOSIS — I872 Venous insufficiency (chronic) (peripheral): Secondary | ICD-10-CM | POA: Insufficient documentation

## 2013-11-03 DIAGNOSIS — L97809 Non-pressure chronic ulcer of other part of unspecified lower leg with unspecified severity: Secondary | ICD-10-CM | POA: Insufficient documentation

## 2013-11-03 DIAGNOSIS — I87319 Chronic venous hypertension (idiopathic) with ulcer of unspecified lower extremity: Secondary | ICD-10-CM | POA: Insufficient documentation

## 2013-12-01 ENCOUNTER — Encounter (HOSPITAL_BASED_OUTPATIENT_CLINIC_OR_DEPARTMENT_OTHER): Payer: 59 | Attending: Internal Medicine

## 2013-12-01 DIAGNOSIS — L97909 Non-pressure chronic ulcer of unspecified part of unspecified lower leg with unspecified severity: Secondary | ICD-10-CM | POA: Diagnosis not present

## 2013-12-01 DIAGNOSIS — R609 Edema, unspecified: Secondary | ICD-10-CM | POA: Diagnosis not present

## 2013-12-01 DIAGNOSIS — L97809 Non-pressure chronic ulcer of other part of unspecified lower leg with unspecified severity: Secondary | ICD-10-CM | POA: Diagnosis not present

## 2013-12-01 DIAGNOSIS — L906 Striae atrophicae: Secondary | ICD-10-CM | POA: Diagnosis not present

## 2013-12-01 DIAGNOSIS — Z87891 Personal history of nicotine dependence: Secondary | ICD-10-CM | POA: Insufficient documentation

## 2013-12-01 DIAGNOSIS — I87319 Chronic venous hypertension (idiopathic) with ulcer of unspecified lower extremity: Secondary | ICD-10-CM | POA: Insufficient documentation

## 2013-12-01 DIAGNOSIS — I872 Venous insufficiency (chronic) (peripheral): Secondary | ICD-10-CM | POA: Insufficient documentation

## 2013-12-08 DIAGNOSIS — I872 Venous insufficiency (chronic) (peripheral): Secondary | ICD-10-CM | POA: Diagnosis not present

## 2013-12-08 DIAGNOSIS — I87319 Chronic venous hypertension (idiopathic) with ulcer of unspecified lower extremity: Secondary | ICD-10-CM | POA: Diagnosis not present

## 2013-12-08 DIAGNOSIS — R609 Edema, unspecified: Secondary | ICD-10-CM | POA: Diagnosis not present

## 2013-12-08 DIAGNOSIS — L97809 Non-pressure chronic ulcer of other part of unspecified lower leg with unspecified severity: Secondary | ICD-10-CM | POA: Diagnosis not present

## 2013-12-15 DIAGNOSIS — R609 Edema, unspecified: Secondary | ICD-10-CM | POA: Diagnosis not present

## 2013-12-15 DIAGNOSIS — I872 Venous insufficiency (chronic) (peripheral): Secondary | ICD-10-CM | POA: Diagnosis not present

## 2013-12-15 DIAGNOSIS — L97809 Non-pressure chronic ulcer of other part of unspecified lower leg with unspecified severity: Secondary | ICD-10-CM | POA: Diagnosis not present

## 2013-12-15 DIAGNOSIS — L97909 Non-pressure chronic ulcer of unspecified part of unspecified lower leg with unspecified severity: Secondary | ICD-10-CM | POA: Diagnosis not present

## 2013-12-15 DIAGNOSIS — I87319 Chronic venous hypertension (idiopathic) with ulcer of unspecified lower extremity: Secondary | ICD-10-CM | POA: Diagnosis not present

## 2013-12-22 DIAGNOSIS — I872 Venous insufficiency (chronic) (peripheral): Secondary | ICD-10-CM | POA: Diagnosis not present

## 2013-12-22 DIAGNOSIS — I87319 Chronic venous hypertension (idiopathic) with ulcer of unspecified lower extremity: Secondary | ICD-10-CM | POA: Diagnosis not present

## 2013-12-22 DIAGNOSIS — L97909 Non-pressure chronic ulcer of unspecified part of unspecified lower leg with unspecified severity: Secondary | ICD-10-CM | POA: Diagnosis not present

## 2013-12-22 DIAGNOSIS — L97809 Non-pressure chronic ulcer of other part of unspecified lower leg with unspecified severity: Secondary | ICD-10-CM | POA: Diagnosis not present

## 2013-12-22 DIAGNOSIS — R609 Edema, unspecified: Secondary | ICD-10-CM | POA: Diagnosis not present

## 2013-12-26 DIAGNOSIS — I872 Venous insufficiency (chronic) (peripheral): Secondary | ICD-10-CM | POA: Diagnosis not present

## 2013-12-29 DIAGNOSIS — I87319 Chronic venous hypertension (idiopathic) with ulcer of unspecified lower extremity: Secondary | ICD-10-CM | POA: Diagnosis not present

## 2013-12-29 DIAGNOSIS — R609 Edema, unspecified: Secondary | ICD-10-CM | POA: Diagnosis not present

## 2013-12-29 DIAGNOSIS — I872 Venous insufficiency (chronic) (peripheral): Secondary | ICD-10-CM | POA: Diagnosis not present

## 2013-12-29 DIAGNOSIS — L97809 Non-pressure chronic ulcer of other part of unspecified lower leg with unspecified severity: Secondary | ICD-10-CM | POA: Diagnosis not present

## 2014-01-02 ENCOUNTER — Encounter (HOSPITAL_BASED_OUTPATIENT_CLINIC_OR_DEPARTMENT_OTHER): Payer: 59 | Attending: Internal Medicine

## 2014-01-02 DIAGNOSIS — L97909 Non-pressure chronic ulcer of unspecified part of unspecified lower leg with unspecified severity: Secondary | ICD-10-CM | POA: Insufficient documentation

## 2014-01-02 DIAGNOSIS — I87339 Chronic venous hypertension (idiopathic) with ulcer and inflammation of unspecified lower extremity: Secondary | ICD-10-CM | POA: Insufficient documentation

## 2014-01-02 DIAGNOSIS — L97309 Non-pressure chronic ulcer of unspecified ankle with unspecified severity: Secondary | ICD-10-CM | POA: Insufficient documentation

## 2014-01-05 ENCOUNTER — Encounter (HOSPITAL_BASED_OUTPATIENT_CLINIC_OR_DEPARTMENT_OTHER): Payer: 59

## 2014-01-05 DIAGNOSIS — L97909 Non-pressure chronic ulcer of unspecified part of unspecified lower leg with unspecified severity: Secondary | ICD-10-CM | POA: Diagnosis not present

## 2014-01-05 DIAGNOSIS — I87339 Chronic venous hypertension (idiopathic) with ulcer and inflammation of unspecified lower extremity: Secondary | ICD-10-CM | POA: Diagnosis not present

## 2014-01-05 DIAGNOSIS — L97309 Non-pressure chronic ulcer of unspecified ankle with unspecified severity: Secondary | ICD-10-CM | POA: Diagnosis not present

## 2014-01-12 DIAGNOSIS — L97309 Non-pressure chronic ulcer of unspecified ankle with unspecified severity: Secondary | ICD-10-CM | POA: Diagnosis not present

## 2014-01-12 DIAGNOSIS — I87339 Chronic venous hypertension (idiopathic) with ulcer and inflammation of unspecified lower extremity: Secondary | ICD-10-CM | POA: Diagnosis not present

## 2014-01-12 DIAGNOSIS — L97909 Non-pressure chronic ulcer of unspecified part of unspecified lower leg with unspecified severity: Secondary | ICD-10-CM | POA: Diagnosis not present

## 2014-01-19 DIAGNOSIS — I87339 Chronic venous hypertension (idiopathic) with ulcer and inflammation of unspecified lower extremity: Secondary | ICD-10-CM | POA: Diagnosis not present

## 2014-01-19 DIAGNOSIS — L97309 Non-pressure chronic ulcer of unspecified ankle with unspecified severity: Secondary | ICD-10-CM | POA: Diagnosis not present

## 2014-01-19 DIAGNOSIS — L97909 Non-pressure chronic ulcer of unspecified part of unspecified lower leg with unspecified severity: Secondary | ICD-10-CM | POA: Diagnosis not present

## 2014-01-26 DIAGNOSIS — L97309 Non-pressure chronic ulcer of unspecified ankle with unspecified severity: Secondary | ICD-10-CM | POA: Diagnosis not present

## 2014-01-26 DIAGNOSIS — L97909 Non-pressure chronic ulcer of unspecified part of unspecified lower leg with unspecified severity: Secondary | ICD-10-CM | POA: Diagnosis not present

## 2014-01-26 DIAGNOSIS — I87339 Chronic venous hypertension (idiopathic) with ulcer and inflammation of unspecified lower extremity: Secondary | ICD-10-CM | POA: Diagnosis not present

## 2014-02-02 ENCOUNTER — Encounter (HOSPITAL_BASED_OUTPATIENT_CLINIC_OR_DEPARTMENT_OTHER): Payer: 59 | Attending: Internal Medicine

## 2014-02-02 DIAGNOSIS — L97909 Non-pressure chronic ulcer of unspecified part of unspecified lower leg with unspecified severity: Secondary | ICD-10-CM | POA: Diagnosis not present

## 2014-02-02 DIAGNOSIS — I87339 Chronic venous hypertension (idiopathic) with ulcer and inflammation of unspecified lower extremity: Secondary | ICD-10-CM | POA: Insufficient documentation

## 2014-02-02 DIAGNOSIS — L97309 Non-pressure chronic ulcer of unspecified ankle with unspecified severity: Secondary | ICD-10-CM | POA: Insufficient documentation

## 2014-02-16 DIAGNOSIS — I87339 Chronic venous hypertension (idiopathic) with ulcer and inflammation of unspecified lower extremity: Secondary | ICD-10-CM | POA: Diagnosis not present

## 2014-02-16 DIAGNOSIS — L97309 Non-pressure chronic ulcer of unspecified ankle with unspecified severity: Secondary | ICD-10-CM | POA: Diagnosis not present

## 2014-02-16 DIAGNOSIS — L97909 Non-pressure chronic ulcer of unspecified part of unspecified lower leg with unspecified severity: Secondary | ICD-10-CM | POA: Diagnosis not present

## 2014-02-23 DIAGNOSIS — I87339 Chronic venous hypertension (idiopathic) with ulcer and inflammation of unspecified lower extremity: Secondary | ICD-10-CM | POA: Diagnosis not present

## 2014-02-23 DIAGNOSIS — L97909 Non-pressure chronic ulcer of unspecified part of unspecified lower leg with unspecified severity: Secondary | ICD-10-CM | POA: Diagnosis not present

## 2014-03-02 ENCOUNTER — Encounter (HOSPITAL_BASED_OUTPATIENT_CLINIC_OR_DEPARTMENT_OTHER): Payer: 59 | Attending: Internal Medicine

## 2014-03-02 DIAGNOSIS — L97329 Non-pressure chronic ulcer of left ankle with unspecified severity: Secondary | ICD-10-CM | POA: Insufficient documentation

## 2014-03-02 DIAGNOSIS — I87332 Chronic venous hypertension (idiopathic) with ulcer and inflammation of left lower extremity: Secondary | ICD-10-CM | POA: Insufficient documentation

## 2014-03-09 DIAGNOSIS — I87332 Chronic venous hypertension (idiopathic) with ulcer and inflammation of left lower extremity: Secondary | ICD-10-CM | POA: Diagnosis not present

## 2014-03-16 DIAGNOSIS — I87332 Chronic venous hypertension (idiopathic) with ulcer and inflammation of left lower extremity: Secondary | ICD-10-CM | POA: Diagnosis not present

## 2014-03-16 DIAGNOSIS — L97329 Non-pressure chronic ulcer of left ankle with unspecified severity: Secondary | ICD-10-CM | POA: Diagnosis not present

## 2014-03-23 DIAGNOSIS — L97329 Non-pressure chronic ulcer of left ankle with unspecified severity: Secondary | ICD-10-CM | POA: Diagnosis not present

## 2014-03-23 DIAGNOSIS — I87332 Chronic venous hypertension (idiopathic) with ulcer and inflammation of left lower extremity: Secondary | ICD-10-CM | POA: Diagnosis not present

## 2014-03-30 DIAGNOSIS — I87332 Chronic venous hypertension (idiopathic) with ulcer and inflammation of left lower extremity: Secondary | ICD-10-CM | POA: Diagnosis not present

## 2014-03-30 DIAGNOSIS — L97329 Non-pressure chronic ulcer of left ankle with unspecified severity: Secondary | ICD-10-CM | POA: Diagnosis not present

## 2014-04-06 ENCOUNTER — Encounter (HOSPITAL_BASED_OUTPATIENT_CLINIC_OR_DEPARTMENT_OTHER): Payer: 59 | Attending: Internal Medicine

## 2014-04-06 DIAGNOSIS — L97329 Non-pressure chronic ulcer of left ankle with unspecified severity: Secondary | ICD-10-CM | POA: Diagnosis not present

## 2014-04-06 DIAGNOSIS — I87331 Chronic venous hypertension (idiopathic) with ulcer and inflammation of right lower extremity: Secondary | ICD-10-CM | POA: Insufficient documentation

## 2014-04-13 DIAGNOSIS — L97329 Non-pressure chronic ulcer of left ankle with unspecified severity: Secondary | ICD-10-CM | POA: Diagnosis not present

## 2014-04-13 DIAGNOSIS — I87331 Chronic venous hypertension (idiopathic) with ulcer and inflammation of right lower extremity: Secondary | ICD-10-CM | POA: Diagnosis not present

## 2014-04-20 DIAGNOSIS — L97329 Non-pressure chronic ulcer of left ankle with unspecified severity: Secondary | ICD-10-CM | POA: Diagnosis not present

## 2014-04-20 DIAGNOSIS — I87331 Chronic venous hypertension (idiopathic) with ulcer and inflammation of right lower extremity: Secondary | ICD-10-CM | POA: Diagnosis not present

## 2014-05-04 ENCOUNTER — Encounter (HOSPITAL_BASED_OUTPATIENT_CLINIC_OR_DEPARTMENT_OTHER): Payer: 59 | Attending: Internal Medicine

## 2014-05-04 DIAGNOSIS — L97329 Non-pressure chronic ulcer of left ankle with unspecified severity: Secondary | ICD-10-CM | POA: Diagnosis not present

## 2014-05-04 DIAGNOSIS — I87332 Chronic venous hypertension (idiopathic) with ulcer and inflammation of left lower extremity: Secondary | ICD-10-CM | POA: Insufficient documentation

## 2014-05-11 DIAGNOSIS — L97329 Non-pressure chronic ulcer of left ankle with unspecified severity: Secondary | ICD-10-CM | POA: Diagnosis not present

## 2014-05-11 DIAGNOSIS — I87332 Chronic venous hypertension (idiopathic) with ulcer and inflammation of left lower extremity: Secondary | ICD-10-CM | POA: Diagnosis not present

## 2014-05-18 DIAGNOSIS — L97329 Non-pressure chronic ulcer of left ankle with unspecified severity: Secondary | ICD-10-CM | POA: Diagnosis not present

## 2014-05-18 DIAGNOSIS — I87332 Chronic venous hypertension (idiopathic) with ulcer and inflammation of left lower extremity: Secondary | ICD-10-CM | POA: Diagnosis not present

## 2014-05-25 DIAGNOSIS — L97329 Non-pressure chronic ulcer of left ankle with unspecified severity: Secondary | ICD-10-CM | POA: Diagnosis not present

## 2014-05-25 DIAGNOSIS — I87332 Chronic venous hypertension (idiopathic) with ulcer and inflammation of left lower extremity: Secondary | ICD-10-CM | POA: Diagnosis not present

## 2014-06-01 DIAGNOSIS — L97329 Non-pressure chronic ulcer of left ankle with unspecified severity: Secondary | ICD-10-CM | POA: Diagnosis not present

## 2014-06-01 DIAGNOSIS — I87332 Chronic venous hypertension (idiopathic) with ulcer and inflammation of left lower extremity: Secondary | ICD-10-CM | POA: Diagnosis not present

## 2014-06-09 ENCOUNTER — Encounter (HOSPITAL_BASED_OUTPATIENT_CLINIC_OR_DEPARTMENT_OTHER): Payer: Medicare Other | Attending: Internal Medicine

## 2014-06-09 DIAGNOSIS — I87332 Chronic venous hypertension (idiopathic) with ulcer and inflammation of left lower extremity: Secondary | ICD-10-CM | POA: Diagnosis not present

## 2014-06-09 DIAGNOSIS — L97821 Non-pressure chronic ulcer of other part of left lower leg limited to breakdown of skin: Secondary | ICD-10-CM | POA: Diagnosis not present

## 2014-06-15 DIAGNOSIS — I87332 Chronic venous hypertension (idiopathic) with ulcer and inflammation of left lower extremity: Secondary | ICD-10-CM | POA: Diagnosis not present

## 2014-06-15 DIAGNOSIS — L97821 Non-pressure chronic ulcer of other part of left lower leg limited to breakdown of skin: Secondary | ICD-10-CM | POA: Diagnosis not present

## 2014-06-22 DIAGNOSIS — L97821 Non-pressure chronic ulcer of other part of left lower leg limited to breakdown of skin: Secondary | ICD-10-CM | POA: Diagnosis not present

## 2014-06-22 DIAGNOSIS — I87332 Chronic venous hypertension (idiopathic) with ulcer and inflammation of left lower extremity: Secondary | ICD-10-CM | POA: Diagnosis not present

## 2014-06-29 DIAGNOSIS — I87332 Chronic venous hypertension (idiopathic) with ulcer and inflammation of left lower extremity: Secondary | ICD-10-CM | POA: Diagnosis not present

## 2014-06-29 DIAGNOSIS — L97821 Non-pressure chronic ulcer of other part of left lower leg limited to breakdown of skin: Secondary | ICD-10-CM | POA: Diagnosis not present

## 2014-07-06 ENCOUNTER — Encounter (HOSPITAL_BASED_OUTPATIENT_CLINIC_OR_DEPARTMENT_OTHER): Payer: Medicare Other | Attending: Internal Medicine

## 2014-07-06 DIAGNOSIS — L97321 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 breakdown of skin: Secondary | ICD-10-CM | POA: Insufficient documentation

## 2014-07-06 DIAGNOSIS — I87332 Chronic venous hypertension (idiopathic) with ulcer and inflammation of left lower extremity: Secondary | ICD-10-CM | POA: Diagnosis not present

## 2014-07-13 DIAGNOSIS — I87332 Chronic venous hypertension (idiopathic) with ulcer and inflammation of left lower extremity: Secondary | ICD-10-CM | POA: Diagnosis not present

## 2014-07-13 DIAGNOSIS — L97321 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 breakdown of skin: Secondary | ICD-10-CM | POA: Diagnosis not present

## 2014-07-20 DIAGNOSIS — I87332 Chronic venous hypertension (idiopathic) with ulcer and inflammation of left lower extremity: Secondary | ICD-10-CM | POA: Diagnosis not present

## 2014-07-20 DIAGNOSIS — L97321 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 breakdown of skin: Secondary | ICD-10-CM | POA: Diagnosis not present

## 2014-08-03 ENCOUNTER — Encounter (HOSPITAL_BASED_OUTPATIENT_CLINIC_OR_DEPARTMENT_OTHER): Payer: Medicare Other | Attending: Internal Medicine

## 2014-08-03 DIAGNOSIS — L97321 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 breakdown of skin: Secondary | ICD-10-CM | POA: Diagnosis not present

## 2014-08-03 DIAGNOSIS — I87332 Chronic venous hypertension (idiopathic) with ulcer and inflammation of left lower extremity: Secondary | ICD-10-CM | POA: Diagnosis present

## 2014-08-10 DIAGNOSIS — L97321 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 breakdown of skin: Secondary | ICD-10-CM | POA: Diagnosis not present

## 2014-08-10 DIAGNOSIS — I87332 Chronic venous hypertension (idiopathic) with ulcer and inflammation of left lower extremity: Secondary | ICD-10-CM | POA: Diagnosis not present

## 2014-08-17 DIAGNOSIS — I87332 Chronic venous hypertension (idiopathic) with ulcer and inflammation of left lower extremity: Secondary | ICD-10-CM | POA: Diagnosis not present

## 2014-08-17 DIAGNOSIS — L97321 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 breakdown of skin: Secondary | ICD-10-CM | POA: Diagnosis not present

## 2014-08-24 DIAGNOSIS — L97321 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 breakdown of skin: Secondary | ICD-10-CM | POA: Diagnosis not present

## 2014-08-24 DIAGNOSIS — I87332 Chronic venous hypertension (idiopathic) with ulcer and inflammation of left lower extremity: Secondary | ICD-10-CM | POA: Diagnosis not present

## 2014-08-31 DIAGNOSIS — I87332 Chronic venous hypertension (idiopathic) with ulcer and inflammation of left lower extremity: Secondary | ICD-10-CM | POA: Diagnosis not present

## 2014-09-07 ENCOUNTER — Encounter (HOSPITAL_BASED_OUTPATIENT_CLINIC_OR_DEPARTMENT_OTHER): Payer: 59 | Attending: Internal Medicine

## 2014-09-07 DIAGNOSIS — I87332 Chronic venous hypertension (idiopathic) with ulcer and inflammation of left lower extremity: Secondary | ICD-10-CM | POA: Diagnosis present

## 2014-09-07 DIAGNOSIS — L97321 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 breakdown of skin: Secondary | ICD-10-CM | POA: Diagnosis not present

## 2014-09-14 DIAGNOSIS — L97321 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 breakdown of skin: Secondary | ICD-10-CM | POA: Diagnosis not present

## 2014-09-14 DIAGNOSIS — I87332 Chronic venous hypertension (idiopathic) with ulcer and inflammation of left lower extremity: Secondary | ICD-10-CM | POA: Diagnosis not present

## 2014-09-21 DIAGNOSIS — I87332 Chronic venous hypertension (idiopathic) with ulcer and inflammation of left lower extremity: Secondary | ICD-10-CM | POA: Diagnosis not present

## 2014-09-21 DIAGNOSIS — L97321 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 breakdown of skin: Secondary | ICD-10-CM | POA: Diagnosis not present

## 2014-10-05 ENCOUNTER — Encounter (HOSPITAL_BASED_OUTPATIENT_CLINIC_OR_DEPARTMENT_OTHER): Payer: 59 | Attending: Internal Medicine

## 2014-10-05 DIAGNOSIS — L97321 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 breakdown of skin: Secondary | ICD-10-CM | POA: Insufficient documentation

## 2014-10-05 DIAGNOSIS — I87333 Chronic venous hypertension (idiopathic) with ulcer and inflammation of bilateral lower extremity: Secondary | ICD-10-CM | POA: Insufficient documentation

## 2014-10-05 DIAGNOSIS — L03116 Cellulitis of left lower limb: Secondary | ICD-10-CM | POA: Insufficient documentation

## 2014-10-19 DIAGNOSIS — I87333 Chronic venous hypertension (idiopathic) with ulcer and inflammation of bilateral lower extremity: Secondary | ICD-10-CM | POA: Diagnosis not present

## 2014-10-19 DIAGNOSIS — L97321 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 breakdown of skin: Secondary | ICD-10-CM | POA: Diagnosis not present

## 2014-10-19 DIAGNOSIS — L03116 Cellulitis of left lower limb: Secondary | ICD-10-CM | POA: Diagnosis not present

## 2014-11-02 ENCOUNTER — Encounter (HOSPITAL_BASED_OUTPATIENT_CLINIC_OR_DEPARTMENT_OTHER): Payer: 59 | Attending: Internal Medicine

## 2014-11-02 DIAGNOSIS — I87333 Chronic venous hypertension (idiopathic) with ulcer and inflammation of bilateral lower extremity: Secondary | ICD-10-CM | POA: Insufficient documentation

## 2014-11-02 DIAGNOSIS — Z880 Allergy status to penicillin: Secondary | ICD-10-CM | POA: Insufficient documentation

## 2014-11-02 DIAGNOSIS — L97321 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 breakdown of skin: Secondary | ICD-10-CM | POA: Insufficient documentation

## 2014-11-02 DIAGNOSIS — L97311 Non-pressure chronic ulcer of right ankle limited to breakdown of skin: Secondary | ICD-10-CM | POA: Insufficient documentation

## 2014-11-16 DIAGNOSIS — I87333 Chronic venous hypertension (idiopathic) with ulcer and inflammation of bilateral lower extremity: Secondary | ICD-10-CM | POA: Diagnosis not present

## 2014-11-16 DIAGNOSIS — Z880 Allergy status to penicillin: Secondary | ICD-10-CM | POA: Diagnosis not present

## 2014-11-16 DIAGNOSIS — L97311 Non-pressure chronic ulcer of right ankle limited to breakdown of skin: Secondary | ICD-10-CM | POA: Diagnosis not present

## 2014-11-16 DIAGNOSIS — L97321 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 breakdown of skin: Secondary | ICD-10-CM | POA: Diagnosis not present

## 2014-11-21 ENCOUNTER — Ambulatory Visit: Payer: 59 | Admitting: Podiatry

## 2014-11-23 DIAGNOSIS — L97321 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 breakdown of skin: Secondary | ICD-10-CM | POA: Diagnosis not present

## 2014-11-23 DIAGNOSIS — I87333 Chronic venous hypertension (idiopathic) with ulcer and inflammation of bilateral lower extremity: Secondary | ICD-10-CM | POA: Diagnosis not present

## 2014-11-23 DIAGNOSIS — L97311 Non-pressure chronic ulcer of right ankle limited to breakdown of skin: Secondary | ICD-10-CM | POA: Diagnosis not present

## 2014-11-23 DIAGNOSIS — Z880 Allergy status to penicillin: Secondary | ICD-10-CM | POA: Diagnosis not present

## 2014-11-30 DIAGNOSIS — L97321 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 breakdown of skin: Secondary | ICD-10-CM | POA: Diagnosis not present

## 2014-11-30 DIAGNOSIS — I87333 Chronic venous hypertension (idiopathic) with ulcer and inflammation of bilateral lower extremity: Secondary | ICD-10-CM | POA: Diagnosis not present

## 2014-11-30 DIAGNOSIS — L97311 Non-pressure chronic ulcer of right ankle limited to breakdown of skin: Secondary | ICD-10-CM | POA: Diagnosis not present

## 2014-11-30 DIAGNOSIS — Z880 Allergy status to penicillin: Secondary | ICD-10-CM | POA: Diagnosis not present

## 2014-12-07 ENCOUNTER — Encounter (HOSPITAL_BASED_OUTPATIENT_CLINIC_OR_DEPARTMENT_OTHER): Payer: 59 | Attending: Internal Medicine

## 2014-12-07 DIAGNOSIS — I10 Essential (primary) hypertension: Secondary | ICD-10-CM | POA: Diagnosis not present

## 2014-12-07 DIAGNOSIS — L03116 Cellulitis of left lower limb: Secondary | ICD-10-CM | POA: Insufficient documentation

## 2014-12-07 DIAGNOSIS — I83223 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 and inflammation: Secondary | ICD-10-CM | POA: Insufficient documentation

## 2014-12-07 DIAGNOSIS — I83212 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 and inflammation: Secondary | ICD-10-CM | POA: Diagnosis not present

## 2014-12-14 DIAGNOSIS — I83212 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 and inflammation: Secondary | ICD-10-CM | POA: Diagnosis not present

## 2014-12-14 DIAGNOSIS — I83223 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 and inflammation: Secondary | ICD-10-CM | POA: Diagnosis not present

## 2014-12-14 DIAGNOSIS — I10 Essential (primary) hypertension: Secondary | ICD-10-CM | POA: Diagnosis not present

## 2014-12-14 DIAGNOSIS — L03116 Cellulitis of left lower limb: Secondary | ICD-10-CM | POA: Diagnosis not present

## 2014-12-21 DIAGNOSIS — I83212 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 and inflammation: Secondary | ICD-10-CM | POA: Diagnosis not present

## 2014-12-21 DIAGNOSIS — I10 Essential (primary) hypertension: Secondary | ICD-10-CM | POA: Diagnosis not present

## 2014-12-21 DIAGNOSIS — L03116 Cellulitis of left lower limb: Secondary | ICD-10-CM | POA: Diagnosis not present

## 2014-12-21 DIAGNOSIS — I83223 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 and inflammation: Secondary | ICD-10-CM | POA: Diagnosis not present

## 2014-12-28 DIAGNOSIS — I10 Essential (primary) hypertension: Secondary | ICD-10-CM | POA: Diagnosis not present

## 2014-12-28 DIAGNOSIS — I83223 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 and inflammation: Secondary | ICD-10-CM | POA: Diagnosis not present

## 2014-12-28 DIAGNOSIS — I83212 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 and inflammation: Secondary | ICD-10-CM | POA: Diagnosis not present

## 2014-12-28 DIAGNOSIS — L03116 Cellulitis of left lower limb: Secondary | ICD-10-CM | POA: Diagnosis not present

## 2015-01-04 ENCOUNTER — Encounter (HOSPITAL_BASED_OUTPATIENT_CLINIC_OR_DEPARTMENT_OTHER): Payer: Commercial Managed Care - HMO | Attending: Internal Medicine

## 2015-01-04 DIAGNOSIS — L97312 Non-pressure chronic ulcer of right ankle with fat layer exposed: Secondary | ICD-10-CM | POA: Insufficient documentation

## 2015-01-04 DIAGNOSIS — L97321 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 breakdown of skin: Secondary | ICD-10-CM | POA: Insufficient documentation

## 2015-01-04 DIAGNOSIS — I87333 Chronic venous hypertension (idiopathic) with ulcer and inflammation of bilateral lower extremity: Secondary | ICD-10-CM | POA: Insufficient documentation

## 2015-01-12 DIAGNOSIS — I87333 Chronic venous hypertension (idiopathic) with ulcer and inflammation of bilateral lower extremity: Secondary | ICD-10-CM | POA: Diagnosis not present

## 2015-01-12 DIAGNOSIS — L97321 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 breakdown of skin: Secondary | ICD-10-CM | POA: Diagnosis not present

## 2015-01-12 DIAGNOSIS — L97312 Non-pressure chronic ulcer of right ankle with fat layer exposed: Secondary | ICD-10-CM | POA: Diagnosis not present

## 2015-01-18 DIAGNOSIS — I87333 Chronic venous hypertension (idiopathic) with ulcer and inflammation of bilateral lower extremity: Secondary | ICD-10-CM | POA: Diagnosis not present

## 2015-01-25 DIAGNOSIS — I87333 Chronic venous hypertension (idiopathic) with ulcer and inflammation of bilateral lower extremity: Secondary | ICD-10-CM | POA: Diagnosis not present

## 2015-01-25 DIAGNOSIS — L97321 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 breakdown of skin: Secondary | ICD-10-CM | POA: Diagnosis not present

## 2015-01-25 DIAGNOSIS — L97312 Non-pressure chronic ulcer of right ankle with fat layer exposed: Secondary | ICD-10-CM | POA: Diagnosis not present

## 2015-02-01 ENCOUNTER — Encounter (HOSPITAL_BASED_OUTPATIENT_CLINIC_OR_DEPARTMENT_OTHER): Payer: 59 | Attending: Internal Medicine

## 2015-02-01 DIAGNOSIS — I83223 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 and inflammation: Secondary | ICD-10-CM | POA: Insufficient documentation

## 2015-02-01 DIAGNOSIS — L03116 Cellulitis of left lower limb: Secondary | ICD-10-CM | POA: Diagnosis not present

## 2015-02-01 DIAGNOSIS — L97321 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 breakdown of skin: Secondary | ICD-10-CM | POA: Insufficient documentation

## 2015-02-01 DIAGNOSIS — L97311 Non-pressure chronic ulcer of right ankle limited to breakdown of skin: Secondary | ICD-10-CM | POA: Diagnosis not present

## 2015-02-01 DIAGNOSIS — I83212 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 and inflammation: Secondary | ICD-10-CM | POA: Insufficient documentation

## 2015-02-08 DIAGNOSIS — L97321 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 breakdown of skin: Secondary | ICD-10-CM | POA: Diagnosis not present

## 2015-02-08 DIAGNOSIS — I83223 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 and inflammation: Secondary | ICD-10-CM | POA: Diagnosis not present

## 2015-02-08 DIAGNOSIS — L97311 Non-pressure chronic ulcer of right ankle limited to breakdown of skin: Secondary | ICD-10-CM | POA: Diagnosis not present

## 2015-02-08 DIAGNOSIS — I83212 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 and inflammation: Secondary | ICD-10-CM | POA: Diagnosis not present

## 2015-02-15 DIAGNOSIS — L97311 Non-pressure chronic ulcer of right ankle limited to breakdown of skin: Secondary | ICD-10-CM | POA: Diagnosis not present

## 2015-02-15 DIAGNOSIS — L97321 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 breakdown of skin: Secondary | ICD-10-CM | POA: Diagnosis not present

## 2015-02-15 DIAGNOSIS — I83212 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 and inflammation: Secondary | ICD-10-CM | POA: Diagnosis not present

## 2015-02-15 DIAGNOSIS — I83223 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 and inflammation: Secondary | ICD-10-CM | POA: Diagnosis not present

## 2015-02-22 DIAGNOSIS — I83223 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 and inflammation: Secondary | ICD-10-CM | POA: Diagnosis not present

## 2015-02-22 DIAGNOSIS — L97321 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 breakdown of skin: Secondary | ICD-10-CM | POA: Diagnosis not present

## 2015-02-22 DIAGNOSIS — I83212 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 and inflammation: Secondary | ICD-10-CM | POA: Diagnosis not present

## 2015-02-22 DIAGNOSIS — L97311 Non-pressure chronic ulcer of right ankle limited to breakdown of skin: Secondary | ICD-10-CM | POA: Diagnosis not present

## 2015-03-01 DIAGNOSIS — L97321 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 breakdown of skin: Secondary | ICD-10-CM | POA: Diagnosis not present

## 2015-03-01 DIAGNOSIS — L97311 Non-pressure chronic ulcer of right ankle limited to breakdown of skin: Secondary | ICD-10-CM | POA: Diagnosis not present

## 2015-03-01 DIAGNOSIS — I83223 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 and inflammation: Secondary | ICD-10-CM | POA: Diagnosis not present

## 2015-03-01 DIAGNOSIS — I83212 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 and inflammation: Secondary | ICD-10-CM | POA: Diagnosis not present

## 2015-03-08 ENCOUNTER — Encounter (HOSPITAL_BASED_OUTPATIENT_CLINIC_OR_DEPARTMENT_OTHER): Payer: Commercial Managed Care - HMO | Attending: Internal Medicine

## 2015-03-08 DIAGNOSIS — L97322 Non-pressure chronic ulcer of left ankle with fat layer exposed: Secondary | ICD-10-CM | POA: Diagnosis not present

## 2015-03-08 DIAGNOSIS — L97312 Non-pressure chronic ulcer of right ankle with fat layer exposed: Secondary | ICD-10-CM | POA: Insufficient documentation

## 2015-03-08 DIAGNOSIS — I872 Venous insufficiency (chronic) (peripheral): Secondary | ICD-10-CM | POA: Insufficient documentation

## 2015-03-08 DIAGNOSIS — R21 Rash and other nonspecific skin eruption: Secondary | ICD-10-CM | POA: Diagnosis not present

## 2015-03-08 DIAGNOSIS — I10 Essential (primary) hypertension: Secondary | ICD-10-CM | POA: Insufficient documentation

## 2015-03-22 DIAGNOSIS — L97322 Non-pressure chronic ulcer of left ankle with fat layer exposed: Secondary | ICD-10-CM | POA: Diagnosis not present

## 2015-03-22 DIAGNOSIS — L97312 Non-pressure chronic ulcer of right ankle with fat layer exposed: Secondary | ICD-10-CM | POA: Diagnosis not present

## 2015-03-22 DIAGNOSIS — I872 Venous insufficiency (chronic) (peripheral): Secondary | ICD-10-CM | POA: Diagnosis not present

## 2015-03-22 DIAGNOSIS — I10 Essential (primary) hypertension: Secondary | ICD-10-CM | POA: Diagnosis not present

## 2015-04-05 ENCOUNTER — Encounter (HOSPITAL_BASED_OUTPATIENT_CLINIC_OR_DEPARTMENT_OTHER): Payer: Commercial Managed Care - HMO | Attending: Internal Medicine

## 2015-04-05 DIAGNOSIS — I10 Essential (primary) hypertension: Secondary | ICD-10-CM | POA: Insufficient documentation

## 2015-04-05 DIAGNOSIS — L97322 Non-pressure chronic ulcer of left ankle with fat layer exposed: Secondary | ICD-10-CM | POA: Diagnosis not present

## 2015-04-05 DIAGNOSIS — I83223 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 and inflammation: Secondary | ICD-10-CM | POA: Insufficient documentation

## 2015-04-05 DIAGNOSIS — L03116 Cellulitis of left lower limb: Secondary | ICD-10-CM | POA: Insufficient documentation

## 2015-04-05 DIAGNOSIS — I83213 Varicose veins of right lower extremity with both ulcer of ankle and inflammation: Secondary | ICD-10-CM | POA: Insufficient documentation

## 2015-04-05 DIAGNOSIS — L97312 Non-pressure chronic ulcer of right ankle with fat layer exposed: Secondary | ICD-10-CM | POA: Diagnosis not present

## 2015-04-19 ENCOUNTER — Other Ambulatory Visit: Payer: Self-pay | Admitting: Internal Medicine

## 2015-04-19 DIAGNOSIS — I83223 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 and inflammation: Secondary | ICD-10-CM | POA: Diagnosis not present

## 2015-04-19 DIAGNOSIS — I83213 Varicose veins of right lower extremity with both ulcer of ankle and inflammation: Secondary | ICD-10-CM | POA: Diagnosis not present

## 2015-04-19 DIAGNOSIS — I872 Venous insufficiency (chronic) (peripheral): Secondary | ICD-10-CM

## 2015-04-19 DIAGNOSIS — L97312 Non-pressure chronic ulcer of right ankle with fat layer exposed: Secondary | ICD-10-CM | POA: Diagnosis not present

## 2015-04-19 DIAGNOSIS — L97322 Non-pressure chronic ulcer of left ankle with fat layer exposed: Secondary | ICD-10-CM | POA: Diagnosis not present

## 2015-04-25 ENCOUNTER — Ambulatory Visit
Admission: RE | Admit: 2015-04-25 | Discharge: 2015-04-25 | Disposition: A | Discharge status: Home / Self Care | Payer: Commercial Managed Care - HMO | Source: Ambulatory Visit | Attending: Internal Medicine | Admitting: Internal Medicine

## 2015-04-25 DIAGNOSIS — I872 Venous insufficiency (chronic) (peripheral): Secondary | ICD-10-CM

## 2015-04-25 HISTORY — DX: Peripheral vascular disease, unspecified: I73.9

## 2015-04-25 NOTE — Consult Note (Signed)
Chief Complaint: Patient was seen in consultation today for bilateral lower extremity venous ulcerations at the request of [name]  Referring Physician(s): [name]  History of Present Illness: [name] is a 59 y.o. female with a long history of superficial venous insufficiency. She presents at the kind request of Dr. Linton [name]. [name] was first diagnosed with venous insufficiency in the late 1990s. Initially only her left lower extremity with symptomatic. She underwent surgical stripping of her left great saphenous vein in the late 90s or early 2000. Subsequently, she has undergone multiple rounds of percutaneous sclerotherapy involving both lower extremities.  Over the last several years she has also started to develop symptoms in her right lower extremity. 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  Her symptoms include swelling of the feet and ankles, breakdown of the skin overlying her medial malleolus bilaterally and significant pain associated with these ulcerations. She does not have any visible or bulging varicose veins. She currently takes prescription hydrocodone for pain. She has worn compression hose in the past but has not been wearing them with her latest round of skin breakdown as she feels she cannot get the hose over her bandages.  She has visited many wound care centers and been treated by several venous specialist. She presents today for an updated ultrasound evaluation and to see if there is anything further that can be offered as her wounds have been extremely slow to heal this time around.  Past Medical History  Diagnosis Date  . Peripheral vascular disease (HCC)     stasis ulcers BLE    Past Surgical History  Procedure Laterality Date  . Ectopic pregnancy surgery  1997  . Tonsillectomy      as a child  . Vein ligation and stripping Left late 1990's    MD in [place]; left ankle     Allergies: Penicillins  Medications: Prior to Admission medications   Medication Sig Start Date End Date Taking? Authorizing Provider  HYDROcodone-acetaminophen (NORCO/VICODIN) 5-325 MG tablet Take 1 tablet by mouth every 6 (six) hours as needed for moderate pain.   Yes Historical Provider, MD  Multiple Vitamin (MULTIVITAMIN) capsule Take 1 capsule by mouth daily.   Yes Historical Provider, MD     No family history on file.  Social History   Social History  . Marital Status: Married    Spouse Name: N/A  . Number of Children: N/A  . Years of Education: N/A   Social History Main Topics  . Smoking status: Former Smoker -- 0.25 packs/day for 26 years    Types: Cigarettes    Quit date: 04/25/2007  . Smokeless tobacco: Never Used  . Alcohol Use: Yes  . Drug Use: No  . Sexual Activity: Yes   Other Topics Concern  . None   Social History Narrative  . None     Review of Systems: A 12 point ROS discussed and pertinent positives are indicated in the HPI above.  All other systems are negative.  Review of Systems  Vital Signs: BP 90/44 mmHg  Pulse 101  Temp(Src) 98.3 F (36.8 C)  Resp 18  Ht 5\' 8"  (1.727 m)  Wt 127 lb (57.607 kg)  BMI 19.31 kg/m2  SpO2 81%  Physical Exam  Constitutional: She is oriented to person, place, and time. She appears well-developed and well-nourished. No distress.  HENT:  Head: Normocephalic and atraumatic.  Eyes: No scleral icterus.  Cardiovascular: Normal rate.  Pulses:      Femoral pulses are 2+ on the right side, and 2+ on the left side.      Dorsalis pedis pulses are 2+ on the right side, and 2+ on the left side.  Pulmonary/Chest: Effort normal.  Abdominal: Soft. She exhibits no distension.  Musculoskeletal:       Feet:  Neurological: She is alert and oriented to person, place, and time.  Psychiatric: She has a normal mood and affect. Her behavior is normal.  Nursing note and vitals reviewed.   Imaging: No results  found.  Labs:  CBC: No results for input(s): WBC, HGB, HCT, PLT in the last 8760 hours.  COAGS: No results for input(s): INR, APTT in the last 8760 hours.  BMP: No results for input(s): NA, K, CL, CO2, GLUCOSE, BUN, CALCIUM, CREATININE, GFRNONAA, GFRAA in the last 8760 hours.  Invalid input(s): CMP  LIVER FUNCTION TESTS: No results for input(s): BILITOT, AST, ALT, ALKPHOS, PROT, ALBUMIN in the last 8760 hours.  TUMOR MARKERS: No results for input(s): AFPTM, CEA, CA199, CHROMGRNA in the last 8760 hours.  Assessment and Plan:  Very pleasant 59 year old female who unfortunately suffers with severe chronic bilateral venous insufficiency with bilateral left greater than right active ulcerations.  On the left, all of her superficial venous disease has been successfully treated in the past. She has evidence of venous insufficiency with in the deep veins proximal to the ankle. Unfortunately, there is no intervention that I can perform to help her on the left. Her primary treatment modality is going to be aggressive compression hose and continued wound care.  On the right, her great saphenous vein has previously been treated and she has no incompetence in the small saphenous vein. 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 These vessels would be amenable to percutaneous sclerotherapy.  1.) Issued a prescription for severe (and if she cannot tolerate these, extra firm) bilateral knee-high compression hose. Given that all of her abnormal veins are below the knee she does not require thigh-high compression hose. I recommended she wear her compression hose over her bandages from the time she gets out of bed until she lays down for the night every day.  2.) Will schedule for her to return for percutaneous sclerotherapy of several incompetent superficial  veins and perforator veins in the right medial and posterior calf.  Thank you for this interesting consult.  I greatly enjoyed meeting [name] and look forward to participating in their care.  A copy of this report was sent to the requesting provider on this date.  SignedJacqulynn [name] 04/25/2015, 12:53 PM   I spent a total of  40 Minutes  in face to face in clinical consultation, greater than 50% of which was counseling/coordinating care for chronic venous insufficiency with active venous ulcers.

## 2015-05-03 ENCOUNTER — Encounter (HOSPITAL_BASED_OUTPATIENT_CLINIC_OR_DEPARTMENT_OTHER): Payer: Commercial Managed Care - HMO | Attending: Internal Medicine

## 2015-05-03 DIAGNOSIS — L97311 Non-pressure chronic ulcer of right ankle limited to breakdown of skin: Secondary | ICD-10-CM | POA: Diagnosis not present

## 2015-05-03 DIAGNOSIS — I83223 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 and inflammation: Secondary | ICD-10-CM | POA: Insufficient documentation

## 2015-05-03 DIAGNOSIS — L03116 Cellulitis of left lower limb: Secondary | ICD-10-CM | POA: Insufficient documentation

## 2015-05-03 DIAGNOSIS — I83213 Varicose veins of right lower extremity with both ulcer of ankle and inflammation: Secondary | ICD-10-CM | POA: Insufficient documentation

## 2015-05-03 DIAGNOSIS — L97322 Non-pressure chronic ulcer of left ankle with fat layer exposed: Secondary | ICD-10-CM | POA: Insufficient documentation

## 2015-05-10 DIAGNOSIS — I83213 Varicose veins of right lower extremity with both ulcer of ankle and inflammation: Secondary | ICD-10-CM | POA: Diagnosis not present

## 2015-05-10 DIAGNOSIS — L97311 Non-pressure chronic ulcer of right ankle limited to breakdown of skin: Secondary | ICD-10-CM | POA: Diagnosis not present

## 2015-05-10 DIAGNOSIS — L97322 Non-pressure chronic ulcer of left ankle with fat layer exposed: Secondary | ICD-10-CM | POA: Diagnosis not present

## 2015-05-10 DIAGNOSIS — I83223 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 and inflammation: Secondary | ICD-10-CM | POA: Diagnosis not present

## 2015-05-16 ENCOUNTER — Other Ambulatory Visit (HOSPITAL_COMMUNITY): Payer: Self-pay | Admitting: Interventional Radiology

## 2015-05-16 DIAGNOSIS — I8390 Asymptomatic varicose veins of unspecified lower extremity: Secondary | ICD-10-CM

## 2015-05-17 DIAGNOSIS — I83223 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 and inflammation: Secondary | ICD-10-CM | POA: Diagnosis not present

## 2015-05-17 DIAGNOSIS — L97311 Non-pressure chronic ulcer of right ankle limited to breakdown of skin: Secondary | ICD-10-CM | POA: Diagnosis not present

## 2015-05-17 DIAGNOSIS — I83213 Varicose veins of right lower extremity with both ulcer of ankle and inflammation: Secondary | ICD-10-CM | POA: Diagnosis not present

## 2015-05-17 DIAGNOSIS — L97322 Non-pressure chronic ulcer of left ankle with fat layer exposed: Secondary | ICD-10-CM | POA: Diagnosis not present

## 2015-05-24 DIAGNOSIS — L97311 Non-pressure chronic ulcer of right ankle limited to breakdown of skin: Secondary | ICD-10-CM | POA: Diagnosis not present

## 2015-05-24 DIAGNOSIS — I83223 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 and inflammation: Secondary | ICD-10-CM | POA: Diagnosis not present

## 2015-05-24 DIAGNOSIS — I83213 Varicose veins of right lower extremity with both ulcer of ankle and inflammation: Secondary | ICD-10-CM | POA: Diagnosis not present

## 2015-05-24 DIAGNOSIS — L97322 Non-pressure chronic ulcer of left ankle with fat layer exposed: Secondary | ICD-10-CM | POA: Diagnosis not present

## 2015-05-31 DIAGNOSIS — I83223 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 and inflammation: Secondary | ICD-10-CM | POA: Diagnosis not present

## 2015-05-31 DIAGNOSIS — I83213 Varicose veins of right lower extremity with both ulcer of ankle and inflammation: Secondary | ICD-10-CM | POA: Diagnosis not present

## 2015-05-31 DIAGNOSIS — L97322 Non-pressure chronic ulcer of left ankle with fat layer exposed: Secondary | ICD-10-CM | POA: Diagnosis not present

## 2015-05-31 DIAGNOSIS — L97311 Non-pressure chronic ulcer of right ankle limited to breakdown of skin: Secondary | ICD-10-CM | POA: Diagnosis not present

## 2015-06-07 ENCOUNTER — Encounter (HOSPITAL_BASED_OUTPATIENT_CLINIC_OR_DEPARTMENT_OTHER): Payer: Commercial Managed Care - HMO | Attending: Internal Medicine

## 2015-06-07 DIAGNOSIS — L97321 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 breakdown of skin: Secondary | ICD-10-CM | POA: Insufficient documentation

## 2015-06-07 DIAGNOSIS — L97312 Non-pressure chronic ulcer of right ankle with fat layer exposed: Secondary | ICD-10-CM | POA: Diagnosis not present

## 2015-06-07 DIAGNOSIS — L03116 Cellulitis of left lower limb: Secondary | ICD-10-CM | POA: Insufficient documentation

## 2015-06-07 DIAGNOSIS — I83223 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 and inflammation: Secondary | ICD-10-CM | POA: Diagnosis not present

## 2015-06-07 DIAGNOSIS — I83213 Varicose veins of right lower extremity with both ulcer of ankle and inflammation: Secondary | ICD-10-CM | POA: Insufficient documentation

## 2015-06-14 DIAGNOSIS — I83223 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 and inflammation: Secondary | ICD-10-CM | POA: Diagnosis not present

## 2015-07-05 ENCOUNTER — Encounter (HOSPITAL_BASED_OUTPATIENT_CLINIC_OR_DEPARTMENT_OTHER): Payer: Commercial Managed Care - HMO | Attending: Internal Medicine

## 2015-07-05 DIAGNOSIS — L97321 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 breakdown of skin: Secondary | ICD-10-CM | POA: Insufficient documentation

## 2015-07-05 DIAGNOSIS — L03116 Cellulitis of left lower limb: Secondary | ICD-10-CM | POA: Diagnosis not present

## 2015-07-05 DIAGNOSIS — I10 Essential (primary) hypertension: Secondary | ICD-10-CM | POA: Insufficient documentation

## 2015-07-05 DIAGNOSIS — L97311 Non-pressure chronic ulcer of right ankle limited to breakdown of skin: Secondary | ICD-10-CM | POA: Diagnosis not present

## 2015-07-05 DIAGNOSIS — I83212 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 and inflammation: Secondary | ICD-10-CM | POA: Insufficient documentation

## 2015-07-05 DIAGNOSIS — I83223 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 and inflammation: Secondary | ICD-10-CM | POA: Diagnosis not present

## 2015-07-12 DIAGNOSIS — I83223 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 and inflammation: Secondary | ICD-10-CM | POA: Diagnosis not present

## 2015-07-26 DIAGNOSIS — I83223 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 and inflammation: Secondary | ICD-10-CM | POA: Diagnosis not present

## 2015-08-09 ENCOUNTER — Encounter (HOSPITAL_BASED_OUTPATIENT_CLINIC_OR_DEPARTMENT_OTHER): Payer: Commercial Managed Care - HMO | Attending: Internal Medicine

## 2015-08-09 DIAGNOSIS — L97311 Non-pressure chronic ulcer of right ankle limited to breakdown of skin: Secondary | ICD-10-CM | POA: Insufficient documentation

## 2015-08-09 DIAGNOSIS — I10 Essential (primary) hypertension: Secondary | ICD-10-CM | POA: Insufficient documentation

## 2015-08-09 DIAGNOSIS — I83212 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 and inflammation: Secondary | ICD-10-CM | POA: Insufficient documentation

## 2015-08-09 DIAGNOSIS — L97322 Non-pressure chronic ulcer of left ankle with fat layer exposed: Secondary | ICD-10-CM | POA: Insufficient documentation

## 2015-08-09 DIAGNOSIS — I83223 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 and inflammation: Secondary | ICD-10-CM | POA: Diagnosis present

## 2015-08-30 DIAGNOSIS — I83212 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 and inflammation: Secondary | ICD-10-CM | POA: Diagnosis not present

## 2015-09-05 ENCOUNTER — Encounter (HOSPITAL_BASED_OUTPATIENT_CLINIC_OR_DEPARTMENT_OTHER): Payer: Commercial Managed Care - HMO | Attending: Internal Medicine

## 2015-09-05 DIAGNOSIS — I83213 Varicose veins of right lower extremity with both ulcer of ankle and inflammation: Secondary | ICD-10-CM | POA: Diagnosis not present

## 2015-09-05 DIAGNOSIS — I83223 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 and inflammation: Secondary | ICD-10-CM | POA: Insufficient documentation

## 2015-09-05 DIAGNOSIS — B965 Pseudomonas (aeruginosa) (mallei) (pseudomallei) as the cause of diseases classified elsewhere: Secondary | ICD-10-CM | POA: Insufficient documentation

## 2015-09-05 DIAGNOSIS — L97322 Non-pressure chronic ulcer of left ankle with fat layer exposed: Secondary | ICD-10-CM | POA: Insufficient documentation

## 2015-09-05 DIAGNOSIS — L03116 Cellulitis of left lower limb: Secondary | ICD-10-CM | POA: Insufficient documentation

## 2015-09-05 DIAGNOSIS — L97311 Non-pressure chronic ulcer of right ankle limited to breakdown of skin: Secondary | ICD-10-CM | POA: Diagnosis not present

## 2015-09-13 DIAGNOSIS — I83223 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 and inflammation: Secondary | ICD-10-CM | POA: Diagnosis not present

## 2015-09-19 ENCOUNTER — Encounter: Payer: Self-pay | Admitting: Vascular Surgery

## 2015-09-21 DIAGNOSIS — I83223 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 and inflammation: Secondary | ICD-10-CM | POA: Diagnosis not present

## 2015-09-26 ENCOUNTER — Encounter: Payer: Commercial Managed Care - HMO | Admitting: Vascular Surgery

## 2015-09-28 ENCOUNTER — Encounter: Payer: Self-pay | Admitting: Vascular Surgery

## 2015-09-28 ENCOUNTER — Ambulatory Visit (HOSPITAL_COMMUNITY)
Admission: RE | Admit: 2015-09-28 | Discharge: 2015-09-28 | Disposition: A | Discharge status: Home / Self Care | Payer: Commercial Managed Care - HMO | Source: Ambulatory Visit | Attending: Internal Medicine | Admitting: Internal Medicine

## 2015-09-28 ENCOUNTER — Other Ambulatory Visit: Payer: Self-pay | Admitting: Internal Medicine

## 2015-09-28 DIAGNOSIS — M25572 Pain in left ankle and joints of left foot: Secondary | ICD-10-CM | POA: Insufficient documentation

## 2015-09-28 DIAGNOSIS — M8580 Other specified disorders of bone density and structure, unspecified site: Secondary | ICD-10-CM | POA: Diagnosis not present

## 2015-09-28 DIAGNOSIS — I83223 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 and inflammation: Secondary | ICD-10-CM | POA: Diagnosis not present

## 2015-09-28 DIAGNOSIS — M79672 Pain in left foot: Secondary | ICD-10-CM

## 2015-10-03 ENCOUNTER — Encounter: Payer: Self-pay | Admitting: Vascular Surgery

## 2015-10-03 ENCOUNTER — Other Ambulatory Visit: Payer: Self-pay

## 2015-10-03 ENCOUNTER — Ambulatory Visit (INDEPENDENT_AMBULATORY_CARE_PROVIDER_SITE_OTHER): Payer: Commercial Managed Care - HMO | Admitting: Vascular Surgery

## 2015-10-03 VITALS — BP 103/60 | HR 65 | Temp 96.8°F | Resp 14 | Ht 68.5 in | Wt 122.0 lb

## 2015-10-03 DIAGNOSIS — I872 Venous insufficiency (chronic) (peripheral): Secondary | ICD-10-CM | POA: Diagnosis not present

## 2015-10-03 NOTE — Progress Notes (Signed)
[facility] and [facility]in Specialist of [place]  Patient name: [name] MRN: [id number] DOB: Aug 05, 1955 Sex: female  REASON FOR CONSULT: Bilateral venous stasis ulcers. Referred by the wound care center, Dr. Dellia Nims  HPI: [name] is a 60 y.o. female, who is referred for evaluation of venous stasis ulcers. This patient has had venous stasis ulcers of both ankles for over a year. She has very extensive wounds and has been treated aggressively at the wound care center. She accidentally been in a study at [facility] in the past and the medication were using she feels was helping. She has a job that requires her to stand 8-12 hours a day. She does try to elevate her legs some. I do not get any clear-cut history of claudication or rest pain.  I have reviewed the notes from interventional radiology on 04/25/2015. The patient was diagnosed with venous insufficiency in the late 1990s. She has undergone previous surgical stripping of her left great saphenous vein. She's also had sclerotherapy of both lower extremities. She was being considered for sclerotherapy of an incompetent perforator in the right leg but this has not been done yet.  She really does not have any risk factors for peripheral vascular disease except a remote history of tobacco use. She quit 9 years ago. She denies any history of diabetes, hypertension, hypercholesterolemia, or family history of premature cardiovascular disease.  Past Medical History  Diagnosis Date  . Peripheral vascular disease (HCC)     stasis ulcers BLE    Family History  Problem Relation Age of Onset  . Diabetes Maternal Grandmother   . Diabetes Paternal Grandmother     SOCIAL HISTORY: Social History   Social History  . Marital Status: Married    Spouse Name: N/A  . Number of Children: N/A  . Years of Education: N/A   Occupational History  . Not on file.   Social History Main Topics  . Smoking status: Former Smoker -- 0.25 packs/day for 26  years    Types: Cigarettes    Quit date: 04/25/2007  . Smokeless tobacco: Never Used  . Alcohol Use: 0.0 oz/week    0 Standard drinks or equivalent per week  . Drug Use: No  . Sexual Activity: Yes   Other Topics Concern  . Not on file   Social History Narrative    Allergies  Allergen Reactions  . Penicillins Hives    Current Outpatient Prescriptions  Medication Sig Dispense Refill  . HYDROcodone-acetaminophen (NORCO/VICODIN) 5-325 MG tablet Take 1 tablet by mouth every 6 (six) hours as needed for moderate pain.    . Multiple Vitamin (MULTIVITAMIN) capsule Take 1 capsule by mouth daily.    . naproxen (NAPROSYN) 500 MG tablet Take 500 mg by mouth 2 (two) times daily with a meal.     No current facility-administered medications for this visit.    REVIEW OF SYSTEMS:  [X]  denotes positive finding, [ ]  denotes negative finding Cardiac  Comments:  Chest pain or chest pressure:    Shortness of breath upon exertion:    Short of breath when lying flat:    Irregular heart rhythm:        Vascular    Pain in calf, thigh, or hip brought on by ambulation:    Pain in feet at night that wakes you up from your sleep:     Blood clot in your veins:    Leg swelling:  X       Pulmonary  Oxygen at home:    Productive cough:     Wheezing:         Neurologic    Sudden weakness in arms or legs:     Sudden numbness in arms or legs:     Sudden onset of difficulty speaking or slurred speech:    Temporary loss of vision in one eye:     Problems with dizziness:         Gastrointestinal    Blood in stool:     Vomited blood:         Genitourinary    Burning when urinating:     Blood in urine:        Psychiatric    Major depression:         Hematologic    Bleeding problems:    Problems with blood clotting too easily:        Skin    Rashes or ulcers: X Or over 1 year.      Constitutional    Fever or chills:      PHYSICAL EXAM: Filed Vitals:   10/03/15 0913  BP: 103/60    Pulse: 65  Temp: 96.8 F (36 C)  Resp: 14  Height: 5' 8.5" (1.74 m)  Weight: 122 lb (55.339 kg)  SpO2: 100%    GENERAL: The patient is a well-nourished female, in no acute distress. The vital signs are documented above. CARDIAC: There is a regular rate and rhythm.  VASCULAR: I do not detect carotid bruits. She has palpable femoral and popliteal pulses bilaterally. She has a biphasic posterior tibial signal bilaterally area she has monophasic dorsalis pedis and peroneal signals bilaterally. PULMONARY: There is good air exchange bilaterally without wheezing or rales. ABDOMEN: Soft and non-tender with normal pitched bowel sounds.  MUSCULOSKELETAL: There are no major deformities or cyanosis. NEUROLOGIC: No focal weakness or paresthesias are detected. SKIN: She has an extensive full-thickness wound on her right medial malleolus which is 8 cm x 7 cm in size. She has an extensive wound of her left medial malleolus is also full-thickness and measures 9 cm x 8 cm in size. PSYCHIATRIC: The patient has a normal affect.  DATA:   BILATERAL LOWER EXTREMITY VENOUS DUPLEX: I have reviewed the duplex that was done in November 2016. There was no evidence of DVT on the right. The right great saphenous vein was not visualized. There was no reflux in the small saphenous vein.  On the left side, there was no evidence of DVT. There was deep vein reflux in the posterior tibial veins. The great saphenous vein on the left had been surgically removed. There was no significant reflux in the short saphenous vein.  MEDICAL ISSUES:  BILATERAL VENOUS STASIS ULCERS: This patient has extensive wounds of both ankles that appear to be venous stasis ulcers. She's had these for over a year. She's had both great saphenous veins treated. 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 Given the extent of the wounds and the fact that she's had these for over a year I have recommended that  we proceed with arteriography to be sure there were not missing any issues with perfusion. She does have reasonable Doppler signals although the peroneal and dorsalis pedis signals are monophasic.  I have reviewed with the patient the indications for arteriography. In addition, I have reviewed the potential complications of arteriography including but not limited to: Bleeding, arterial injury, arterial thrombosis, dye action, renal  insufficiency, or other unpredictable medical problems. I have explained to the patient that if we find disease amenable to angioplasty we could potentially address this at the same time. I have discussed the potential complications of angioplasty and stenting, including but not limited to: Bleeding, arterial thrombosis, arterial injury, dissection, or the need for surgical intervention.   The arteriogram will also help [place] determine if she can aggressively be treated with leg elevation and compression therapy. We have discussed the importance of leg elevation in the proper positioning for this.  I will make further recommendations pending the results of her arteriogram.  Deitra Mayo [facility]: [phone]

## 2015-10-05 ENCOUNTER — Encounter (HOSPITAL_BASED_OUTPATIENT_CLINIC_OR_DEPARTMENT_OTHER): Payer: Commercial Managed Care - HMO | Attending: Internal Medicine

## 2015-10-05 DIAGNOSIS — I83212 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 and inflammation: Secondary | ICD-10-CM | POA: Insufficient documentation

## 2015-10-05 DIAGNOSIS — I83223 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 and inflammation: Secondary | ICD-10-CM | POA: Insufficient documentation

## 2015-10-05 DIAGNOSIS — L97312 Non-pressure chronic ulcer of right ankle with fat layer exposed: Secondary | ICD-10-CM | POA: Insufficient documentation

## 2015-10-05 DIAGNOSIS — L97322 Non-pressure chronic ulcer of left ankle with fat layer exposed: Secondary | ICD-10-CM | POA: Diagnosis not present

## 2015-10-15 ENCOUNTER — Ambulatory Visit (HOSPITAL_COMMUNITY)
Admission: RE | Admit: 2015-10-15 | Discharge: 2015-10-15 | Disposition: A | Discharge status: Home / Self Care | Payer: Commercial Managed Care - HMO | Source: Ambulatory Visit | Attending: Vascular Surgery | Admitting: Vascular Surgery

## 2015-10-15 ENCOUNTER — Encounter (HOSPITAL_COMMUNITY): Payer: Self-pay | Admitting: Vascular Surgery

## 2015-10-15 ENCOUNTER — Encounter (HOSPITAL_COMMUNITY)
Admission: RE | Disposition: A | Discharge status: Home / Self Care | Payer: Self-pay | Source: Ambulatory Visit | Attending: Vascular Surgery

## 2015-10-15 DIAGNOSIS — Z87891 Personal history of nicotine dependence: Secondary | ICD-10-CM | POA: Insufficient documentation

## 2015-10-15 DIAGNOSIS — L97329 Non-pressure chronic ulcer of left ankle with unspecified severity: Secondary | ICD-10-CM | POA: Diagnosis not present

## 2015-10-15 DIAGNOSIS — I70233 Atherosclerosis of native arteries of right leg with ulceration of ankle: Secondary | ICD-10-CM | POA: Diagnosis not present

## 2015-10-15 DIAGNOSIS — Z880 Allergy status to penicillin: Secondary | ICD-10-CM | POA: Diagnosis not present

## 2015-10-15 DIAGNOSIS — L97319 Non-pressure chronic ulcer of right ankle with unspecified severity: Secondary | ICD-10-CM | POA: Diagnosis not present

## 2015-10-15 DIAGNOSIS — I70243 Atherosclerosis of native arteries of left leg with ulceration of ankle: Secondary | ICD-10-CM | POA: Diagnosis not present

## 2015-10-15 DIAGNOSIS — I872 Venous insufficiency (chronic) (peripheral): Secondary | ICD-10-CM | POA: Diagnosis present

## 2015-10-15 DIAGNOSIS — I878 Other specified disorders of veins: Secondary | ICD-10-CM | POA: Diagnosis not present

## 2015-10-15 HISTORY — PX: PERIPHERAL VASCULAR CATHETERIZATION: SHX172C

## 2015-10-15 LAB — POCT I-STAT, CHEM 8
BUN: 13 mg/dL (ref 6–20)
CHLORIDE: 103 mmol/L (ref 101–111)
Calcium, Ion: 1.2 mmol/L (ref 1.12–1.23)
Creatinine, Ser: 0.7 mg/dL (ref 0.44–1.00)
Glucose, Bld: 86 mg/dL (ref 65–99)
HEMATOCRIT: 39 % (ref 36.0–46.0)
Hemoglobin: 13.3 g/dL (ref 12.0–15.0)
Potassium: 3.6 mmol/L (ref 3.5–5.1)
SODIUM: 142 mmol/L (ref 135–145)
TCO2: 29 mmol/L (ref 0–100)

## 2015-10-15 SURGERY — ABDOMINAL AORTOGRAM W/LOWER EXTREMITY

## 2015-10-15 MED ORDER — FENTANYL CITRATE (PF) 100 MCG/2ML IJ SOLN
INTRAMUSCULAR | Status: DC | PRN
  Administered 2015-10-15: 50 ug via INTRAVENOUS

## 2015-10-15 MED ORDER — HEPARIN (PORCINE) IN NACL 2-0.9 UNIT/ML-% IJ SOLN
INTRAMUSCULAR | Status: AC
  Filled 2015-10-15: qty 1000

## 2015-10-15 MED ORDER — LIDOCAINE HCL (PF) 1 % IJ SOLN
INTRAMUSCULAR | Status: AC
  Filled 2015-10-15: qty 30

## 2015-10-15 MED ORDER — OXYCODONE-ACETAMINOPHEN 5-325 MG PO TABS: 1.0000 | ORAL_TABLET | ORAL | Status: DC | PRN

## 2015-10-15 MED ORDER — MIDAZOLAM HCL 2 MG/2ML IJ SOLN
INTRAMUSCULAR | Status: DC | PRN
  Administered 2015-10-15: 1 mg via INTRAVENOUS

## 2015-10-15 MED ORDER — MIDAZOLAM HCL 2 MG/2ML IJ SOLN
INTRAMUSCULAR | Status: AC
  Filled 2015-10-15: qty 2

## 2015-10-15 MED ORDER — HYDRALAZINE HCL 20 MG/ML IJ SOLN: 10.0000 mg | INTRAMUSCULAR | Status: DC | PRN

## 2015-10-15 MED ORDER — LIDOCAINE HCL (PF) 1 % IJ SOLN
INTRAMUSCULAR | Status: DC | PRN
  Administered 2015-10-15: 22 mL

## 2015-10-15 MED ORDER — IODIXANOL 320 MG/ML IV SOLN
INTRAVENOUS | Status: DC | PRN
  Administered 2015-10-15: 142 mL via INTRAVENOUS

## 2015-10-15 MED ORDER — HEPARIN (PORCINE) IN NACL 2-0.9 UNIT/ML-% IJ SOLN
INTRAMUSCULAR | Status: DC | PRN
  Administered 2015-10-15: 1000 mL

## 2015-10-15 MED ORDER — FENTANYL CITRATE (PF) 100 MCG/2ML IJ SOLN
INTRAMUSCULAR | Status: AC
  Filled 2015-10-15: qty 2

## 2015-10-15 MED ORDER — SODIUM CHLORIDE 0.9 % IV SOLN
INTRAVENOUS | Status: DC
  Administered 2015-10-15: 06:00:00 via INTRAVENOUS

## 2015-10-15 SURGICAL SUPPLY — 10 items
CATH ANGIO 5F PIGTAIL 65CM (CATHETERS) ×2 IMPLANT
COVER PRB 48X5XTLSCP FOLD TPE (BAG) ×1 IMPLANT
COVER PROBE 5X48 (BAG) ×1
KIT PV (KITS) ×2 IMPLANT
SET INTRODUCER MICROPUNCT 5F (INTRODUCER) ×2 IMPLANT
SHEATH PINNACLE 5F 10CM (SHEATH) ×2 IMPLANT
SYR MEDRAD MARK V 150ML (SYRINGE) ×2 IMPLANT
TRANSDUCER W/STOPCOCK (MISCELLANEOUS) ×2 IMPLANT
TRAY PV CATH (CUSTOM PROCEDURE TRAY) ×2 IMPLANT
WIRE HITORQ VERSACORE ST 145CM (WIRE) ×2 IMPLANT

## 2015-10-15 NOTE — H&P (View-Only) (Signed)
Vascular and Vein Specialist of [place]  Patient name: [name] MRN: [id number] DOB: [birth year] Sex: female  REASON FOR CONSULT: Bilateral venous stasis ulcers. Referred by the wound care center, Dr. Dellia Nims  HPI: [name] is a 60 y.o. female, who is referred for evaluation of venous stasis ulcers. This patient has had venous stasis ulcers of both ankles for over a year. She has very extensive wounds and has been treated aggressively at the wound care center. She accidentally been in a study at [facility] in the past and the medication were using she feels was helping. She has a job that requires her to stand 8-12 hours a day. She does try to elevate her legs some. I do not get any clear-cut history of claudication or rest pain.  I have reviewed the notes from interventional radiology on 04/25/2015. The patient was diagnosed with venous insufficiency in the late 1990s. She has undergone previous surgical stripping of her left great saphenous vein. She's also had sclerotherapy of both lower extremities. She was being considered for sclerotherapy of an incompetent perforator in the right leg but this has not been done yet.  She really does not have any risk factors for peripheral vascular disease except a remote history of tobacco use. She quit 9 years ago. She denies any history of diabetes, hypertension, hypercholesterolemia, or family history of premature cardiovascular disease.  Past Medical History  Diagnosis Date  . Peripheral vascular disease (HCC)     stasis ulcers BLE    Family History  Problem Relation Age of Onset  . Diabetes Maternal Grandmother   . Diabetes Paternal Grandmother     SOCIAL HISTORY: Social History   Social History  . Marital Status: Married    Spouse Name: N/A  . Number of Children: N/A  . Years of Education: N/A   Occupational History  . Not on file.   Social History Main Topics  . Smoking status: Former Smoker -- 0.25 packs/day for 26  years    Types: Cigarettes    Quit date: 04/25/2007  . Smokeless tobacco: Never Used  . Alcohol Use: 0.0 oz/week    0 Standard drinks or equivalent per week  . Drug Use: No  . Sexual Activity: Yes   Other Topics Concern  . Not on file   Social History Narrative    Allergies  Allergen Reactions  . Penicillins Hives    Current Outpatient Prescriptions  Medication Sig Dispense Refill  . HYDROcodone-acetaminophen (NORCO/VICODIN) 5-325 MG tablet Take 1 tablet by mouth every 6 (six) hours as needed for moderate pain.    . Multiple Vitamin (MULTIVITAMIN) capsule Take 1 capsule by mouth daily.    . naproxen (NAPROSYN) 500 MG tablet Take 500 mg by mouth 2 (two) times daily with a meal.     No current facility-administered medications for this visit.    REVIEW OF SYSTEMS:  [X]  denotes positive finding, [ ]  denotes negative finding Cardiac  Comments:  Chest pain or chest pressure:    Shortness of breath upon exertion:    Short of breath when lying flat:    Irregular heart rhythm:        Vascular    Pain in calf, thigh, or hip brought on by ambulation:    Pain in feet at night that wakes you up from your sleep:     Blood clot in your veins:    Leg swelling:  X       Pulmonary  Oxygen at home:    Productive cough:     Wheezing:         Neurologic    Sudden weakness in arms or legs:     Sudden numbness in arms or legs:     Sudden onset of difficulty speaking or slurred speech:    Temporary loss of vision in one eye:     Problems with dizziness:         Gastrointestinal    Blood in stool:     Vomited blood:         Genitourinary    Burning when urinating:     Blood in urine:        Psychiatric    Major depression:         Hematologic    Bleeding problems:    Problems with blood clotting too easily:        Skin    Rashes or ulcers: X Or over 1 year.      Constitutional    Fever or chills:      PHYSICAL EXAM: Filed Vitals:   10/03/15 0913  BP: 103/60    Pulse: 65  Temp: 96.8 F (36 C)  Resp: 14  Height: 5' 8.5" (1.74 m)  Weight: 122 lb (55.339 kg)  SpO2: 100%    GENERAL: The patient is a well-nourished female, in no acute distress. The vital signs are documented above. CARDIAC: There is a regular rate and rhythm.  VASCULAR: I do not detect carotid bruits. She has palpable femoral and popliteal pulses bilaterally. She has a biphasic posterior tibial signal bilaterally area she has monophasic dorsalis pedis and peroneal signals bilaterally. PULMONARY: There is good air exchange bilaterally without wheezing or rales. ABDOMEN: Soft and non-tender with normal pitched bowel sounds.  MUSCULOSKELETAL: There are no major deformities or cyanosis. NEUROLOGIC: No focal weakness or paresthesias are detected. SKIN: She has an extensive full-thickness wound on her right medial malleolus which is 8 cm x 7 cm in size. She has an extensive wound of her left medial malleolus is also full-thickness and measures 9 cm x 8 cm in size. PSYCHIATRIC: The patient has a normal affect.  DATA:   BILATERAL LOWER EXTREMITY VENOUS DUPLEX: I have reviewed the duplex that was done in November 2016. There was no evidence of DVT on the right. The right great saphenous vein was not visualized. There was no reflux in the small saphenous vein.  On the left side, there was no evidence of DVT. There was deep vein reflux in the posterior tibial veins. The great saphenous vein on the left had been surgically removed. There was no significant reflux in the short saphenous vein.  MEDICAL ISSUES:  BILATERAL VENOUS STASIS ULCERS: This patient has extensive wounds of both ankles that appear to be venous stasis ulcers. She's had these for over a year. She's had both great saphenous veins treated. 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 Given the extent of the wounds and the fact that she's had these for over a year I have recommended that  we proceed with arteriography to be sure there were not missing any issues with perfusion. She does have reasonable Doppler signals although the peroneal and dorsalis pedis signals are monophasic.  I have reviewed with the patient the indications for arteriography. In addition, I have reviewed the potential complications of arteriography including but not limited to: Bleeding, arterial injury, arterial thrombosis, dye action, renal  insufficiency, or other unpredictable medical problems. I have explained to the patient that if we find disease amenable to angioplasty we could potentially address this at the same time. I have discussed the potential complications of angioplasty and stenting, including but not limited to: Bleeding, arterial thrombosis, arterial injury, dissection, or the need for surgical intervention.   The arteriogram will also help [place] determine if she can aggressively be treated with leg elevation and compression therapy. We have discussed the importance of leg elevation in the proper positioning for this.  I will make further recommendations pending the results of her arteriogram.  Deitra Mayo [facility] of [facility]: [phone]

## 2015-10-15 NOTE — Interval H&P Note (Signed)
History and Physical Interval Note:  10/15/2015 7:38 AM  [name]  has presented today for surgery, with the diagnosis of bilatral lower extremity venustatus ulcers  The various methods of treatment have been discussed with the patient and family. After consideration of risks, benefits and other options for treatment, the patient has consented to  Procedure(s): Abdominal Aortogram w/Lower Extremity (N/A) as a surgical intervention .  The patient's history has been reviewed, patient examined, no change in status, stable for surgery.  I have reviewed the patient's chart and labs.  Questions were answered to the patient's satisfaction.     Deitra Mayo

## 2015-10-15 NOTE — Progress Notes (Signed)
Site area: Right groin a 5 french arterial sheath was removed  Site Prior to Removal:  Level 0  Pressure Applied For 15 MINUTES    Bedrest Beginning at 0845a  Manual:   Yes.    Patient Status During Pull:  stable  Post Pull Groin Site:  Level 0  Post Pull Instructions Given:  Yes.    Post Pull Pulses Present:  Yes.    Dressing Applied:  Yes.    Comments:  VS remain stable during sheath pull

## 2015-10-15 NOTE — Op Note (Signed)
   PATIENT: [name]   MRN: [id number] DOB: 10/18/1955    DATE OF PROCEDURE: 10/15/2015  INDICATIONS: [name] is a 60 y.o. female history of bilateral venous stasis ulcers for over a year. On exam she had biphasic posterior tibial signals but monophasic dorsalis pedis signals.  Given that she had the wounds for over a year of felt that arteriography was indicated in order to be sure that we were not missing any tibial artery occlusive disease which might compromise healing and also to allow for elevation and compression if there was no significant disease.  PROCEDURE:  1. Ultrasound-guided access to the right common femoral artery 2. Aortogram with bilateral iliac arteriogram and bilateral lower extremity runoff  SURGEON: Judeth Cornfield. Scot Dock, MD, FACS  ANESTHESIA: local with sedation   EBL: minimal  TECHNIQUE: The patient was taken to the peripheral vascular lab. The period of conscious sedation began at (7:47 AM) and ended at (88:09 AM).  During that time period, I was present face-to-face 100% of the time.  The patient was administered (1 mg of Versed and 50 g of fentanyl.). The patient's heart rate, blood pressure, and oxygen saturation were monitored by the nurse continuously during the procedure.  Both groins were prepped and draped in usual sterile fashion. Under ultrasound guidance, after the skin was anesthetized, the right common femoral artery was cannulated with a micropuncture needle and micropuncture sheath introduced over a wire. This was exchanged for a 5 French sheath over a versa core wire. The pigtail catheter was positioned at the L1 vertebral body and flush aortogram obtained. The catheter was repositioned above the aortic bifurcation and an oblique iliac projection was obtained. Next bilateral lower extremity runoff films were obtained. Next the pigtail catheter was removed over a wire. The patient was transferred to the holding area for removal of the sheath. No  immediate complications were noted.  FINDINGS:  1. There are single renal arteries bilaterally with no significant renal artery stenosis identified. 2. There is no significant aortoiliac occlusive disease. The infrarenal aorta, bilateral common iliac arteries, bilateral hypogastric arteries, and bilateral external iliac arteries are patent. 3. On the right side, the common femoral, superficial femoral, deep femoral, popliteal, anterior tibial, peroneal artery, and posterior tibial arteries are patent. The dorsalis pedis is occluded in the foot on the right. 4. On the left side, the common femoral, superficial femoral, deep femoral, popliteal, anterior tibial, peroneal, and posterior tibial arteries are patent.  CLINICAL NOTE: This patient has no evidence of significant infrainguinal arterial occlusive disease. Her venous stasis ulcers can be treated aggressively with elevation and compression therapy. She is being considered for skin grafting. She'll follow up at the wound care center. I'll see her back as needed. Deitra Mayo, MD, FACS [facility]  DATE OF DICTATION:   10/15/2015

## 2015-10-15 NOTE — Discharge Instructions (Signed)
Angiogram, Care After °These instructions give you information about caring for yourself after your procedure. Your doctor may also give you more specific instructions. Call your doctor if you have any problems or questions after your procedure.  °HOME CARE °· Take medicines only as told by your doctor. °· Follow your doctor's instructions about: °¨ Care of the area where the tube was inserted. °¨ Bandage (dressing) changes and removal. °· You may shower 24-48 hours after the procedure or as told by your doctor. °· Do not take baths, swim, or use a hot tub until your doctor approves. °· Every day, check the area where the tube was inserted. Watch for: °¨ Redness, swelling, or pain. °¨ Fluid, blood, or pus. °· Do not apply powder or lotion to the site. °· Do not lift anything that is heavier than 10 lb (4.5 kg) for 5 days or as told by your doctor. °· Ask your doctor when you can: °¨ Return to work or school. °¨ Do physical activities or play sports. °¨ Have sex. °· Do not drive or operate heavy machinery for 24 hours or as told by your doctor. °· Have someone with you for the first 24 hours after the procedure. °· Keep all follow-up visits as told by your doctor. This is important. °GET HELP IF: °· You have a fever.   °· You have chills.   °· You have more bleeding from the area where the tube was inserted. Hold pressure on the area. °· You have redness, swelling, or pain in the area where the tube was inserted. °· You have fluid or pus coming from the area. °GET HELP RIGHT AWAY IF:  °· You have a lot of pain in the area where the tube was inserted. °· The area where the tube was inserted is bleeding, and the bleeding does not stop after 30 minutes of holding steady pressure on the area. °· The area near or just beyond the insertion site becomes pale, cool, tingly, or numb. °  °This information is not intended to replace advice given to you by your health care provider. Make sure you discuss any questions you have  with your health care provider. °  °Document Released: 08/15/2008 Document Revised: 06/09/2014 Document Reviewed: 10/20/2012 °Elsevier Interactive Patient Education ©2016 [employer] ° °

## 2015-10-19 DIAGNOSIS — I83223 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 and inflammation: Secondary | ICD-10-CM | POA: Diagnosis not present

## 2015-10-25 DIAGNOSIS — I83223 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 and inflammation: Secondary | ICD-10-CM | POA: Diagnosis not present

## 2015-10-30 ENCOUNTER — Telehealth: Payer: Self-pay

## 2015-10-30 NOTE — Telephone Encounter (Signed)
Phone call to pt.  Reported she had a dye test study" done on 10/15/15.  C/o a knot in the right groin that is tender.  Reported that "a lot of the bruising has faded, except for a purple bruise beneath the tender knot."  Stated she kept the initial dressing in place, and removed it later in the week, following the angiogram.  Reported the knot had been the size of the end of her index finger, and now has decreased to a little less than pea-size.  Denied any redness, warmth, or drainage.  Questioned if the tender knot is to be expected.  Advised will report to an MD for further recommendation.

## 2015-10-31 NOTE — Telephone Encounter (Signed)
Sched appt 6/1 at 1:45. Lm on hm# to inform pt of appt.

## 2015-10-31 NOTE — Telephone Encounter (Signed)
Discussed pt's. symptoms with Dr. Donnetta Hutching. Recommended to schedule for office visit to check right groin knot.  Stated no ultrasound needed, initially.  Contacted pt.  Informed of recommendations per Dr. Donnetta Hutching.  Advised that she will be contacted by our scheduler for an appt.  Agreed w/ plan.

## 2015-11-01 ENCOUNTER — Encounter (HOSPITAL_BASED_OUTPATIENT_CLINIC_OR_DEPARTMENT_OTHER): Payer: Commercial Managed Care - HMO | Attending: Internal Medicine

## 2015-11-01 ENCOUNTER — Ambulatory Visit (INDEPENDENT_AMBULATORY_CARE_PROVIDER_SITE_OTHER): Payer: Self-pay | Admitting: Family

## 2015-11-01 ENCOUNTER — Ambulatory Visit (HOSPITAL_COMMUNITY)
Admission: RE | Admit: 2015-11-01 | Discharge: 2015-11-01 | Disposition: A | Discharge status: Home / Self Care | Payer: Commercial Managed Care - HMO | Source: Ambulatory Visit | Attending: Family | Admitting: Family

## 2015-11-01 ENCOUNTER — Encounter: Payer: Self-pay | Admitting: Family

## 2015-11-01 VITALS — BP 94/58 | HR 67 | Temp 98.1°F | Resp 16 | Ht 68.0 in | Wt 120.0 lb

## 2015-11-01 DIAGNOSIS — I83212 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 and inflammation: Secondary | ICD-10-CM | POA: Insufficient documentation

## 2015-11-01 DIAGNOSIS — I739 Peripheral vascular disease, unspecified: Secondary | ICD-10-CM | POA: Insufficient documentation

## 2015-11-01 DIAGNOSIS — L97312 Non-pressure chronic ulcer of right ankle with fat layer exposed: Secondary | ICD-10-CM | POA: Diagnosis not present

## 2015-11-01 DIAGNOSIS — R1909 Other intra-abdominal and pelvic swelling, mass and lump: Secondary | ICD-10-CM

## 2015-11-01 DIAGNOSIS — L97929 Non-pressure chronic ulcer of unspecified part of left lower leg with unspecified severity: Secondary | ICD-10-CM

## 2015-11-01 DIAGNOSIS — L97919 Non-pressure chronic ulcer of unspecified part of right lower leg with unspecified severity: Secondary | ICD-10-CM

## 2015-11-01 DIAGNOSIS — L03116 Cellulitis of left lower limb: Secondary | ICD-10-CM | POA: Insufficient documentation

## 2015-11-01 DIAGNOSIS — I83223 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 and inflammation: Secondary | ICD-10-CM | POA: Diagnosis present

## 2015-11-01 DIAGNOSIS — L97321 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 breakdown of skin: Secondary | ICD-10-CM | POA: Diagnosis not present

## 2015-11-01 DIAGNOSIS — I83019 Varicose veins of right lower extremity with ulcer of unspecified site: Secondary | ICD-10-CM

## 2015-11-01 DIAGNOSIS — Z9889 Other specified postprocedural states: Secondary | ICD-10-CM

## 2015-11-01 DIAGNOSIS — I872 Venous insufficiency (chronic) (peripheral): Secondary | ICD-10-CM

## 2015-11-01 DIAGNOSIS — I83029 Varicose veins of left lower extremity with ulcer of unspecified site: Secondary | ICD-10-CM

## 2015-11-01 NOTE — Progress Notes (Signed)
CC: c/o pea sized mass right groin s/p arteriogram    History of Present Illness  [name] is a 60 y.o. female patient of Dr. Scot Dock who is s/p arteriogram with bilateral run off on 10/15/15. Pt returns today with C/o a knot in the right groin that is tender. Reported that "a lot of the bruising has faded, except for a purple bruise beneath the tender knot." Stated she kept the initial dressing in place, and removed it later in the week, following the angiogram. Reported the knot had been the size of the end of her index finger, and now has decreased to a little less than pea-size. Denied any redness, warmth, or drainage.  Findings from arteriogram :  1. There are single renal arteries bilaterally with no significant renal artery stenosis identified. 2. There is no significant aortoiliac occlusive disease. The infrarenal aorta, bilateral common iliac arteries, bilateral hypogastric arteries, and bilateral external iliac arteries are patent. 3. On the right side, the common femoral, superficial femoral, deep femoral, popliteal, anterior tibial, peroneal artery, and posterior tibial arteries are patent. The dorsalis pedis is occluded in the foot on the right. 4. On the left side, the common femoral, superficial femoral, deep femoral, popliteal, anterior tibial, peroneal, and posterior tibial arteries are patent.  CLINICAL NOTE: This patient has no evidence of significant infrainguinal arterial occlusive disease. Her venous stasis ulcers can be treated aggressively with elevation and compression therapy. She is being considered for skin grafting. She'll follow up at the wound care center. Dr. Scot Dock was to see her back as needed.  Venous duplex was done in November 2016. There was no evidence of DVT on the right. The right great saphenous vein was not visualized. There was no reflux in the small saphenous vein. On the left side, there was no evidence of DVT. There was deep vein reflux  in the posterior tibial veins. The great saphenous vein on the left had been surgically removed. There was no significant reflux in the short saphenous vein.  This patient has extensive wounds of both ankles that appear to be venous stasis ulcers. She's had these for over a year. She's had both great saphenous veins treated. 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 Given the extent of the wounds and the fact that she's had these for over a year I have recommended that we proceed with arteriography to be sure there were not missing any issues with perfusion. She does have reasonable Doppler signals although the peroneal and dorsalis pedis signals are monophasic.  The patient is able to complete their activities of daily living.  T  Past Medical History, Past Surgical History, Social History, Family History, Medications, Allergies, and Review of Systems are unchanged from previous evaluation on 10/03/15.  For VQI Use Only  PRE-ADM LIVING: Home  AMB STATUS: Ambulatory  Physical Examination  Filed Vitals:   11/01/15 1401  BP: 94/58  Pulse: 67  Temp: 98.1 F (36.7 C)  Resp: 16  Height: 5\' 8"  (1.727 m)  Weight: 120 lb (54.432 kg)  SpO2: 99%   Body mass index is 18.25 kg/(m^2).  PHYSICAL EXAMINATION: General: The patient appears their stated age.   HEENT:  No gross abnormalities Pulmonary: Respirations are non-labored Abdomen: Soft and non-tender. Musculoskeletal: There are no major deformities.   Neurologic: No focal weakness or paresthesias are detected, Skin: There are no ulcer or rashes noted, see Vascular. Psychiatric: The patient has normal affect.  Cardiovascular: There is a regular rate and rhythm without significant murmur appreciated.  Vascular: Medicated compression dressings in place both lower legs.    LOWER EXTREMITY ARTERIAL DUPLEX EVALUATION 11/01/2015     INDICATION:     PREVIOUS INTERVENTION(S):     DUPLEX EXAM:        Diameter     AP TRSV Velocity (cm/s) Image  Common Femoral Artery      Superficial Femoral Artery Proximal         Compressible Flow Characteristics  Common Femoral Vein Y   Femoral Vein Proximal y                                                                                    Legend:  Y = Yes, N = No, D = Decreased     ADDITIONAL FINDINGS:     IMPRESSION: No evidence of pseudoaneurysm in the right groin.     Medical Decision Making  [name] is a 60 y.o. female who presents s/p arteriogram with bilateral run off on 10/15/15. Right groin has no swelling, faint/fading evidence of ecchymosis at right labia majora, and no palpable mass. Right groin arterial duplex today finds no evidence of a pseudoaneurysm.  I discussed pt HPI and results of physical exam with Dr. Oneida Alar.  As per Dr. Nicole Cella plan after pt's arteriogram, continue at wound care center for management of venous ulcers. Follow up with [place] as needed.   NICKEL, Sharmon Leyden, RN, MSN, FNP-C [facility] Office: [phone]  11/01/2015, 2:05 PM  Clinic MD: Oneida Alar

## 2015-11-08 DIAGNOSIS — I83223 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 and inflammation: Secondary | ICD-10-CM | POA: Diagnosis not present

## 2015-11-22 DIAGNOSIS — I83223 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 and inflammation: Secondary | ICD-10-CM | POA: Diagnosis not present

## 2015-11-29 DIAGNOSIS — I83223 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 and inflammation: Secondary | ICD-10-CM | POA: Diagnosis not present

## 2015-12-06 ENCOUNTER — Other Ambulatory Visit: Payer: Self-pay | Admitting: Plastic Surgery

## 2015-12-06 DIAGNOSIS — S81802A Unspecified open wound, left lower leg, initial encounter: Secondary | ICD-10-CM

## 2015-12-06 DIAGNOSIS — S81801A Unspecified open wound, right lower leg, initial encounter: Secondary | ICD-10-CM

## 2015-12-06 DIAGNOSIS — S81001A Unspecified open wound, right knee, initial encounter: Secondary | ICD-10-CM

## 2015-12-06 DIAGNOSIS — S91001A Unspecified open wound, right ankle, initial encounter: Secondary | ICD-10-CM

## 2015-12-12 ENCOUNTER — Encounter (HOSPITAL_BASED_OUTPATIENT_CLINIC_OR_DEPARTMENT_OTHER)
Admission: RE | Disposition: A | Discharge status: Home / Self Care | Payer: Self-pay | Source: Ambulatory Visit | Attending: Plastic Surgery

## 2015-12-12 ENCOUNTER — Encounter (HOSPITAL_BASED_OUTPATIENT_CLINIC_OR_DEPARTMENT_OTHER): Payer: Self-pay | Admitting: Certified Registered"

## 2015-12-12 ENCOUNTER — Ambulatory Visit (HOSPITAL_BASED_OUTPATIENT_CLINIC_OR_DEPARTMENT_OTHER): Payer: Commercial Managed Care - HMO | Admitting: Certified Registered"

## 2015-12-12 ENCOUNTER — Ambulatory Visit (HOSPITAL_BASED_OUTPATIENT_CLINIC_OR_DEPARTMENT_OTHER)
Admission: RE | Admit: 2015-12-12 | Discharge: 2015-12-12 | Disposition: A | Discharge status: Home / Self Care | Payer: Commercial Managed Care - HMO | Source: Ambulatory Visit | Attending: Plastic Surgery | Admitting: Plastic Surgery

## 2015-12-12 DIAGNOSIS — L97829 Non-pressure chronic ulcer of other part of left lower leg with unspecified severity: Secondary | ICD-10-CM | POA: Insufficient documentation

## 2015-12-12 DIAGNOSIS — S81001A Unspecified open wound, right knee, initial encounter: Secondary | ICD-10-CM

## 2015-12-12 DIAGNOSIS — S81801A Unspecified open wound, right lower leg, initial encounter: Secondary | ICD-10-CM

## 2015-12-12 DIAGNOSIS — I739 Peripheral vascular disease, unspecified: Secondary | ICD-10-CM | POA: Diagnosis not present

## 2015-12-12 DIAGNOSIS — L97819 Non-pressure chronic ulcer of other part of right lower leg with unspecified severity: Secondary | ICD-10-CM | POA: Diagnosis not present

## 2015-12-12 DIAGNOSIS — S81802A Unspecified open wound, left lower leg, initial encounter: Secondary | ICD-10-CM

## 2015-12-12 DIAGNOSIS — Z87891 Personal history of nicotine dependence: Secondary | ICD-10-CM | POA: Diagnosis not present

## 2015-12-12 DIAGNOSIS — S91001A Unspecified open wound, right ankle, initial encounter: Secondary | ICD-10-CM

## 2015-12-12 HISTORY — PX: I & D EXTREMITY: SHX5045

## 2015-12-12 HISTORY — PX: APPLICATION OF WOUND VAC: SHX5189

## 2015-12-12 SURGERY — IRRIGATION AND DEBRIDEMENT EXTREMITY
Anesthesia: General | Site: Ankle | Laterality: Bilateral

## 2015-12-12 MED ORDER — ONDANSETRON HCL 4 MG/2ML IJ SOLN
INTRAMUSCULAR | Status: AC
  Filled 2015-12-12: qty 2

## 2015-12-12 MED ORDER — LIDOCAINE-EPINEPHRINE 1 %-1:100000 IJ SOLN
INTRAMUSCULAR | Status: AC
  Filled 2015-12-12: qty 1

## 2015-12-12 MED ORDER — HYDROCODONE-ACETAMINOPHEN 10-325 MG PO TABS: 1.0000 | ORAL_TABLET | Freq: Four times a day (QID) | ORAL | Status: DC | PRN

## 2015-12-12 MED ORDER — PROPOFOL 10 MG/ML IV BOLUS
INTRAVENOUS | Status: DC | PRN
  Administered 2015-12-12: 100 mg via INTRAVENOUS

## 2015-12-12 MED ORDER — LIDOCAINE HCL (PF) 1 % IJ SOLN
INTRAMUSCULAR | Status: AC
  Filled 2015-12-12: qty 30

## 2015-12-12 MED ORDER — MIDAZOLAM HCL 2 MG/2ML IJ SOLN
INTRAMUSCULAR | Status: AC
  Filled 2015-12-12: qty 2

## 2015-12-12 MED ORDER — SCOPOLAMINE 1 MG/3DAYS TD PT72: 1.0000 | MEDICATED_PATCH | Freq: Once | TRANSDERMAL | Status: DC | PRN

## 2015-12-12 MED ORDER — LIDOCAINE 2% (20 MG/ML) 5 ML SYRINGE
INTRAMUSCULAR | Status: DC | PRN
  Administered 2015-12-12: 40 mg via INTRAVENOUS

## 2015-12-12 MED ORDER — BUPIVACAINE-EPINEPHRINE (PF) 0.25% -1:200000 IJ SOLN
INTRAMUSCULAR | Status: AC
  Filled 2015-12-12: qty 60

## 2015-12-12 MED ORDER — PROMETHAZINE HCL 25 MG/ML IJ SOLN: 6.2500 mg | INTRAMUSCULAR | Status: DC | PRN

## 2015-12-12 MED ORDER — FENTANYL CITRATE (PF) 100 MCG/2ML IJ SOLN
INTRAMUSCULAR | Status: AC
  Filled 2015-12-12: qty 2

## 2015-12-12 MED ORDER — CIPROFLOXACIN IN D5W 400 MG/200ML IV SOLN
400.0000 mg | INTRAVENOUS | Status: AC
  Administered 2015-12-12: 400 mg via INTRAVENOUS

## 2015-12-12 MED ORDER — BUPIVACAINE HCL (PF) 0.5 % IJ SOLN
INTRAMUSCULAR | Status: AC
  Filled 2015-12-12: qty 30

## 2015-12-12 MED ORDER — ONDANSETRON HCL 4 MG/2ML IJ SOLN
INTRAMUSCULAR | Status: DC | PRN
  Administered 2015-12-12: 4 mg via INTRAVENOUS

## 2015-12-12 MED ORDER — CHLORHEXIDINE GLUCONATE CLOTH 2 % EX PADS: 6.0000 | MEDICATED_PAD | Freq: Once | CUTANEOUS | Status: DC

## 2015-12-12 MED ORDER — MIDAZOLAM HCL 2 MG/2ML IJ SOLN
1.0000 mg | INTRAMUSCULAR | Status: DC | PRN
  Administered 2015-12-12: 1 mg via INTRAVENOUS

## 2015-12-12 MED ORDER — FENTANYL CITRATE (PF) 100 MCG/2ML IJ SOLN
25.0000 ug | INTRAMUSCULAR | Status: DC | PRN
  Administered 2015-12-12 (×3): 50 ug via INTRAVENOUS

## 2015-12-12 MED ORDER — HYDROCODONE-ACETAMINOPHEN 5-325 MG PO TABS
ORAL_TABLET | ORAL | Status: AC
  Filled 2015-12-12: qty 1

## 2015-12-12 MED ORDER — GLYCOPYRROLATE 0.2 MG/ML IJ SOLN: 0.2000 mg | Freq: Once | INTRAMUSCULAR | Status: DC | PRN

## 2015-12-12 MED ORDER — HYDROCODONE-ACETAMINOPHEN 7.5-325 MG PO TABS: 1.0000 | ORAL_TABLET | Freq: Once | ORAL | Status: DC | PRN

## 2015-12-12 MED ORDER — SODIUM CHLORIDE 0.9 % IR SOLN
Status: DC | PRN
  Administered 2015-12-12: 1000 mL

## 2015-12-12 MED ORDER — FENTANYL CITRATE (PF) 100 MCG/2ML IJ SOLN
50.0000 ug | INTRAMUSCULAR | Status: AC | PRN
Start: 2015-12-12 — End: 2015-12-12
  Administered 2015-12-12 (×4): 50 ug via INTRAVENOUS

## 2015-12-12 MED ORDER — LACTATED RINGERS IV SOLN
INTRAVENOUS | Status: DC
  Administered 2015-12-12 (×2): via INTRAVENOUS

## 2015-12-12 MED ORDER — BACITRACIN-NEOMYCIN-POLYMYXIN 400-5-5000 EX OINT
TOPICAL_OINTMENT | CUTANEOUS | Status: AC
  Filled 2015-12-12: qty 1

## 2015-12-12 MED ORDER — HYDROCODONE-ACETAMINOPHEN 5-325 MG PO TABS
1.0000 | ORAL_TABLET | Freq: Once | ORAL | Status: AC | PRN
  Administered 2015-12-12: 1 via ORAL

## 2015-12-12 MED ORDER — BUPIVACAINE HCL (PF) 0.25 % IJ SOLN
INTRAMUSCULAR | Status: AC
  Filled 2015-12-12: qty 30

## 2015-12-12 MED ORDER — LIDOCAINE 2% (20 MG/ML) 5 ML SYRINGE
INTRAMUSCULAR | Status: AC
  Filled 2015-12-12: qty 5

## 2015-12-12 MED ORDER — CIPROFLOXACIN IN D5W 400 MG/200ML IV SOLN
INTRAVENOUS | Status: AC
  Filled 2015-12-12: qty 200

## 2015-12-12 SURGICAL SUPPLY — 32 items
BAG DECANTER FOR FLEXI CONT (MISCELLANEOUS) ×6 IMPLANT
BANDAGE ACE 4X5 VEL STRL LF (GAUZE/BANDAGES/DRESSINGS) ×6 IMPLANT
BLADE SURG 10 STRL SS (BLADE) ×3 IMPLANT
BLADE SURG 15 STRL LF DISP TIS (BLADE) ×2 IMPLANT
BLADE SURG 15 STRL SS (BLADE) ×1
BNDG GAUZE ELAST 4 BULKY (GAUZE/BANDAGES/DRESSINGS) ×6 IMPLANT
CANISTER SUCT 1200ML W/VALVE (MISCELLANEOUS) ×6 IMPLANT
COVER BACK TABLE 60X90IN (DRAPES) ×3 IMPLANT
DRAPE EXTREMITY BILATERAL (DRAPES) ×3 IMPLANT
DRAPE INCISE IOBAN 66X45 STRL (DRAPES) ×3 IMPLANT
DRESSING DUODERM 4X4 STERILE (GAUZE/BANDAGES/DRESSINGS) ×3 IMPLANT
ELECT REM PT RETURN 9FT ADLT (ELECTROSURGICAL) ×3
ELECTRODE REM PT RTRN 9FT ADLT (ELECTROSURGICAL) ×2 IMPLANT
GAUZE SPONGE 4X4 12PLY STRL (GAUZE/BANDAGES/DRESSINGS) ×3 IMPLANT
GLOVE BIO SURGEON STRL SZ 6.5 (GLOVE) ×6 IMPLANT
GLOVE BIOGEL PI IND STRL 7.0 (GLOVE) ×4 IMPLANT
GLOVE BIOGEL PI INDICATOR 7.0 (GLOVE) ×2
GLOVE ECLIPSE 6.5 STRL STRAW (GLOVE) ×3 IMPLANT
GOWN STRL REUS W/ TWL LRG LVL3 (GOWN DISPOSABLE) ×4 IMPLANT
GOWN STRL REUS W/TWL LRG LVL3 (GOWN DISPOSABLE) ×2
PACK BASIN DAY SURGERY FS (CUSTOM PROCEDURE TRAY) ×3 IMPLANT
PENCIL BUTTON HOLSTER BLD 10FT (ELECTRODE) ×3 IMPLANT
SHEET MEDIUM DRAPE 40X70 STRL (DRAPES) ×3 IMPLANT
SPONGE LAP 18X18 X RAY DECT (DISPOSABLE) ×6 IMPLANT
SWAB COLLECTION DEVICE MRSA (MISCELLANEOUS) ×6 IMPLANT
SWAB CULTURE ESWAB REG 1ML (MISCELLANEOUS) ×6 IMPLANT
SYR BULB IRRIGATION 50ML (SYRINGE) ×3 IMPLANT
TOWEL OR 17X24 6PK STRL BLUE (TOWEL DISPOSABLE) ×3 IMPLANT
TRAY DSU PREP LF (CUSTOM PROCEDURE TRAY) ×3 IMPLANT
TUBE CONNECTING 20X1/4 (TUBING) ×3 IMPLANT
UNDERPAD 30X30 (UNDERPADS AND DIAPERS) ×3 IMPLANT
YANKAUER SUCT BULB TIP NO VENT (SUCTIONS) ×3 IMPLANT

## 2015-12-12 NOTE — Anesthesia Preprocedure Evaluation (Signed)
Anesthesia Evaluation  Patient identified by MRN, date of birth, ID band Patient awake    Reviewed: Allergy & Precautions, NPO status , Patient's Chart, lab work & pertinent test results  Airway Mallampati: II  TM Distance: >3 FB Neck ROM: Full    Dental  (+) Dental Advisory Given   Pulmonary former smoker,    breath sounds clear to auscultation       Cardiovascular + Peripheral Vascular Disease   Rhythm:Regular Rate:Normal     Neuro/Psych negative neurological ROS     GI/Hepatic negative GI ROS, Neg liver ROS,   Endo/Other  negative endocrine ROS  Renal/GU negative Renal ROS     Musculoskeletal   Abdominal   Peds  Hematology negative hematology ROS (+)   Anesthesia Other Findings   Reproductive/Obstetrics                             Anesthesia Physical Anesthesia Plan  ASA: II  Anesthesia Plan: General   Post-op Pain Management:    Induction: Intravenous  Airway Management Planned: LMA  Additional Equipment:   Intra-op Plan:   Post-operative Plan: Extubation in OR  Informed Consent: I have reviewed the patients History and Physical, chart, labs and discussed the procedure including the risks, benefits and alternatives for the proposed anesthesia with the patient or authorized representative who has indicated his/her understanding and acceptance.   Dental advisory given  Plan Discussed with: CRNA  Anesthesia Plan Comments:         Anesthesia Quick Evaluation

## 2015-12-12 NOTE — Transfer of Care (Signed)
Immediate Anesthesia Transfer of Care Note  Patient: [name]  Procedure(s) Performed: Procedure(s): DEBRIDEMENT OF BILATERAL LEG WOUNDS (Bilateral) APPLICATION OF WOUND VAC (Bilateral)  Patient Location: PACU  Anesthesia Type:General  Level of Consciousness: awake, alert  and patient cooperative  Airway & Oxygen Therapy: Patient Spontanous Breathing and Patient connected to face mask oxygen  Post-op Assessment: Report given to RN, Post -op Vital signs reviewed and stable and Patient moving all extremities  Post vital signs: Reviewed and stable  Last Vitals:  Filed Vitals:   12/12/15 1107  Pulse: 59  Temp: 36.7 C  Resp: 18    Last Pain:  Filed Vitals:   12/12/15 1111  PainSc: 8          Complications: No apparent anesthesia complications

## 2015-12-12 NOTE — H&P (Signed)
[name] is an 60 y.o. female.   Chief Complaint: bilateral leg ulcers HPI: The patient is a 60 y.o. 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  She states that she has been dealing with wounds on her legs for the past 9 years.  About 1 year ago they got worse.  They are very painful.  She had a consult with Dr. Scot Dock and was told there were no blockages.  She has been seeing Dr. Dellia Nims at the [facility] for the past year.  She has had multiple attempts at closure with Apligraf and other materials.  They have not gotten better. She complains of severe pain.  She currently takes norco for the pain.  She does smoke.  She denies any other drug use.  She denies diabetes or hear disease.  The areas are ~ 5 x 7 cm on the medial aspect of both ankles.  There is exudate and film over the area.  It is painful to tough.  Pulses are strong, regular and equal.  Past Medical History  Diagnosis Date  . Peripheral vascular disease (HCC)     stasis ulcers BLE    Past Surgical History  Procedure Laterality Date  . Ectopic pregnancy surgery  1997  . Tonsillectomy      as a child  . Vein ligation and stripping Left late 1990's    MD in [place]; left ankle  . Peripheral vascular catheterization N/A 10/15/2015    Procedure: Abdominal Aortogram w/Lower Extremity;  Surgeon: Angelia Mould, MD;  Location: [facility];  Service: Cardiovascular;  Laterality: N/A;    Family History  Problem Relation Age of Onset  . Diabetes Maternal Grandmother   . Diabetes Paternal Grandmother    Social History:  reports that she quit smoking about 8 years ago. Her smoking use included Cigarettes. She has a 6.5 pack-year smoking history. She has never used smokeless tobacco. She reports that she drinks alcohol. She reports that she does not use illicit drugs.  Allergies:  Allergies  Allergen  Reactions  . Penicillins Hives    No prescriptions prior to admission    No results found for this or any previous visit (from the past 30 hour(s)). No results found.  Review of Systems  Constitutional: Negative.   HENT: Negative.   Eyes: Negative.   Respiratory: Negative.   Cardiovascular: Negative.   Gastrointestinal: Negative.   Genitourinary: Negative.   Musculoskeletal: Negative.   Skin: Negative.   Psychiatric/Behavioral: Negative.     Height 5\' 8"  (1.727 m), weight 54.432 kg (120 lb). Physical Exam  Constitutional: She is oriented to person, place, and time. She appears well-developed and well-nourished.  HENT:  Head: Normocephalic and atraumatic.  Eyes: Conjunctivae and EOM are normal. Pupils are equal, round, and reactive to light.  Cardiovascular: Normal rate.   Respiratory: Effort normal.  GI: Soft. She exhibits no distension.  Musculoskeletal:       Legs: Neurological: She is alert and oriented to person, place, and time.  Skin: Skin is warm.  Psychiatric: She has a normal mood and affect. Her behavior is normal. Judgment and thought content normal.     Assessment/Plan Recommend debridement under anesthesia and a VAC on bilateral lower extremities.  Increase protein, multivitamin, vit C and Zinc.  No smoking.  We discussed that we are not a pain service and will not  be providing pain medication of any type at any time. The consent was obtained with risks and complications reviewed which included bleeding, pain, scar, infection and the risk of anesthesia.  The patients questions were answered to the patients expressed satisfaction. Will try to arrange for [facility] RN for [facility] dressing changes.       [name], DO 12/12/2015, 9:43 AM

## 2015-12-12 NOTE — Anesthesia Procedure Notes (Signed)
Procedure Name: LMA Insertion Date/Time: 12/12/2015 12:47 PM Performed by: Baxter Flattery Pre-anesthesia Checklist: Patient identified, Emergency Drugs available, Suction available and Patient being monitored Patient Re-evaluated:Patient Re-evaluated prior to inductionOxygen Delivery Method: Circle system utilized Preoxygenation: Pre-oxygenation with 100% oxygen Intubation Type: IV induction Ventilation: Mask ventilation without difficulty LMA: LMA inserted LMA Size: 3.0 Number of attempts: 1 Airway Equipment and Method: Bite block Placement Confirmation: positive ETCO2 and breath sounds checked- equal and bilateral Tube secured with: Tape Dental Injury: Teeth and Oropharynx as per pre-operative assessment

## 2015-12-12 NOTE — Brief Op Note (Signed)
12/12/2015  12:48 PM  PATIENT:  [name]  60 y.o. female  PRE-OPERATIVE DIAGNOSIS:  Bilateral leg wounds  POST-OPERATIVE DIAGNOSIS:  Bilateral leg wounds  PROCEDURE:  Procedure(s): DEBRIDEMENT OF BILATERAL LEG WOUNDS WITH PLACEMENT OF A CELL AND VAC (Bilateral) APPLICATION OF A-CELL OF EXTREMITY (Bilateral)  SURGEON:  Surgeon(s) and Role:    * Loel Lofty Dillingham, DO - Primary  PHYSICIAN ASSISTANT: Shawn Rayburn, PA  ASSISTANTS: none   ANESTHESIA:   general  EBL:     BLOOD ADMINISTERED:none  DRAINS: none   LOCAL MEDICATIONS USED:  NONE  SPECIMEN:  Source of Specimen:  bilateral leg wounds  DISPOSITION OF SPECIMEN:  micro and path  COUNTS:  YES  TOURNIQUET:    DICTATION: .Dragon Dictation  PLAN OF CARE: Discharge to home after PACU  PATIENT DISPOSITION:  PACU - hemodynamically stable.   Delay start of Pharmacological VTE agent (>24hrs) due to surgical blood loss or risk of bleeding: no

## 2015-12-12 NOTE — Anesthesia Postprocedure Evaluation (Signed)
Anesthesia Post Note  Patient: [name]  Procedure(s) Performed: Procedure(s) (LRB): DEBRIDEMENT OF BILATERAL LEG WOUNDS (Bilateral) APPLICATION OF WOUND VAC (Bilateral)  Patient location during evaluation: PACU Anesthesia Type: General Level of consciousness: awake and alert Pain management: pain level controlled Vital Signs Assessment: post-procedure vital signs reviewed and stable Respiratory status: spontaneous breathing, nonlabored ventilation, respiratory function stable and patient connected to nasal cannula oxygen Cardiovascular status: blood pressure returned to baseline and stable Postop Assessment: no signs of nausea or vomiting Anesthetic complications: no    Last Vitals:  Filed Vitals:   12/12/15 1430 12/12/15 1445  BP: 151/62 135/62  Pulse: 52 55  Temp:    Resp: 16 12    Last Pain:  Filed Vitals:   12/12/15 1453  PainSc: 6                  [name]

## 2015-12-12 NOTE — Discharge Instructions (Addendum)
Follow up this Friday. Don't need to change VAC Keep connected to suction 125 mmHg pressure.   Post Anesthesia Home Care Instructions  Activity: Get plenty of rest for the remainder of the day. A responsible adult should stay with you for 24 hours following the procedure.  For the next 24 hours, DO NOT: -Drive a car -Paediatric nurse -Drink alcoholic beverages -Take any medication unless instructed by your physician -Make any legal decisions or sign important papers.  Meals: Start with liquid foods such as gelatin or soup. Progress to regular foods as tolerated. Avoid greasy, spicy, heavy foods. If nausea and/or vomiting occur, drink only clear liquids until the nausea and/or vomiting subsides. Call your physician if vomiting continues.  Special Instructions/Symptoms: Your throat may feel dry or sore from the anesthesia or the breathing tube placed in your throat during surgery. If this causes discomfort, gargle with warm salt water. The discomfort should disappear within 24 hours.  If you had a scopolamine patch placed behind your ear for the management of post- operative nausea and/or vomiting:  1. The medication in the patch is effective for 72 hours, after which it should be removed.  Wrap patch in a tissue and discard in the trash. Wash hands thoroughly with soap and water. 2. You may remove the patch earlier than 72 hours if you experience unpleasant side effects which may include dry mouth, dizziness or visual disturbances. 3. Avoid touching the patch. Wash your hands with soap and water after contact with the patch.

## 2015-12-12 NOTE — Interval H&P Note (Signed)
History and Physical Interval Note:  12/12/2015 12:06 PM  [name]  has presented today for surgery, with the diagnosis of bilateral leg wounds  The various methods of treatment have been discussed with the patient and family. After consideration of risks, benefits and other options for treatment, the patient has consented to  Procedure(s): DEBRIDEMENT OF BILATERAL LEG WOUNDS WITH PLACEMENT OF A CELL AND VAC (Bilateral) APPLICATION OF A-CELL OF EXTREMITY (Bilateral) as a surgical intervention .  The patient's history has been reviewed, patient examined, no change in status, stable for surgery.  I have reviewed the patient's chart and labs.  Questions were answered to the patient's satisfaction.     [name]

## 2015-12-12 NOTE — Op Note (Signed)
Operative Note   DATE OF OPERATION: 12/12/2015  LOCATION: [place]  SURGICAL DIVISION: Plastic Surgery  PREOPERATIVE DIAGNOSES:  Bilateral leg wound  POSTOPERATIVE DIAGNOSES:  same  PROCEDURE:  Excisional debridement skin, fat and subcutaneous tissue of right leg/ankle wound (7 x 7 cm) and Right leg/ankle wounds (7 x 9 cm)  SURGEON: Wanette Robison Sanger Shirely Toren, DO  ASSISTANT: Shawn Rayburn, PA  ANESTHESIA:  General.   COMPLICATIONS: None.   INDICATIONS FOR PROCEDURE:  The patient, [name] is a 60 y.o. female born on 05/05/1956, is here for treatment of chronic bilateral leg ulcers. MRN: [id number]  CONSENT:  Informed consent was obtained directly from the patient. Risks, benefits and alternatives were fully discussed. Specific risks including but not limited to bleeding, infection, hematoma, seroma, scarring, pain, infection, contracture, asymmetry, wound healing problems, and need for further surgery were all discussed. The patient did have an ample opportunity to have questions answered to satisfaction.   DESCRIPTION OF PROCEDURE:  The patient was taken to the operating room. Antibiotics were given. The patient's operative site was prepped and draped in a sterile fashion. A time out was performed and all information was confirmed to be correct.  General anesthesia was administered.    Right: The medial leg / ankle and leg wound was debrided sharply with the #10 blade and curette 7 x 7 cm.  Specimens were sent for gram stain culture and sensitivity.  A tissue culture was sent from the 12 o'clock position.  The area was then irrigated with antibiotic solution.  Hemostasis was achieved with electrocautery.  There was good blood supply.  Left: The medial leg / ankle and leg wound was debrided sharply with the #10 blade and curette 7 x 9 cm.  Specimens were sent for gram stain culture and sensitivity.  A tissue culture was sent from the 12 o'clock position.   The area was then irrigated with antibiotic solution.  Hemostasis was achieved with electrocautery.  There was good blood supply.  Duoderm was placed around the wound to protect the adjacent skin from irritation. The VAC was applied and there was an excellent seal at 125 mmHg pressure. The patient tolerated the procedure well.  There were no complications. The patient was allowed to wake from anesthesia, extubated and taken to the recovery room in satisfactory condition.

## 2015-12-13 ENCOUNTER — Encounter (HOSPITAL_BASED_OUTPATIENT_CLINIC_OR_DEPARTMENT_OTHER): Payer: Self-pay | Admitting: Plastic Surgery

## 2015-12-17 LAB — AEROBIC/ANAEROBIC CULTURE (SURGICAL/DEEP WOUND): GRAM STAIN: NONE SEEN

## 2015-12-17 LAB — AEROBIC/ANAEROBIC CULTURE W GRAM STAIN (SURGICAL/DEEP WOUND)

## 2015-12-28 ENCOUNTER — Encounter (HOSPITAL_BASED_OUTPATIENT_CLINIC_OR_DEPARTMENT_OTHER): Payer: Commercial Managed Care - HMO | Attending: Internal Medicine

## 2015-12-28 DIAGNOSIS — L97321 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 breakdown of skin: Secondary | ICD-10-CM | POA: Insufficient documentation

## 2015-12-28 DIAGNOSIS — I83212 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 and inflammation: Secondary | ICD-10-CM | POA: Insufficient documentation

## 2015-12-28 DIAGNOSIS — L97312 Non-pressure chronic ulcer of right ankle with fat layer exposed: Secondary | ICD-10-CM | POA: Insufficient documentation

## 2015-12-28 DIAGNOSIS — I83223 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 and inflammation: Secondary | ICD-10-CM | POA: Insufficient documentation

## 2016-01-02 ENCOUNTER — Other Ambulatory Visit: Payer: Self-pay | Admitting: Plastic Surgery

## 2016-01-02 DIAGNOSIS — S81009A Unspecified open wound, unspecified knee, initial encounter: Secondary | ICD-10-CM

## 2016-01-02 DIAGNOSIS — S91009A Unspecified open wound, unspecified ankle, initial encounter: Principal | ICD-10-CM

## 2016-01-02 DIAGNOSIS — S81809A Unspecified open wound, unspecified lower leg, initial encounter: Principal | ICD-10-CM

## 2016-01-04 ENCOUNTER — Encounter (HOSPITAL_BASED_OUTPATIENT_CLINIC_OR_DEPARTMENT_OTHER): Payer: Self-pay | Admitting: *Deleted

## 2016-01-07 ENCOUNTER — Ambulatory Visit (HOSPITAL_BASED_OUTPATIENT_CLINIC_OR_DEPARTMENT_OTHER)
Admission: RE | Admit: 2016-01-07 | Discharge: 2016-01-07 | Disposition: A | Discharge status: Home / Self Care | Payer: Commercial Managed Care - HMO | Source: Ambulatory Visit | Attending: Plastic Surgery | Admitting: Plastic Surgery

## 2016-01-07 ENCOUNTER — Ambulatory Visit (HOSPITAL_BASED_OUTPATIENT_CLINIC_OR_DEPARTMENT_OTHER): Payer: Commercial Managed Care - HMO | Admitting: Anesthesiology

## 2016-01-07 ENCOUNTER — Encounter (HOSPITAL_BASED_OUTPATIENT_CLINIC_OR_DEPARTMENT_OTHER)
Admission: RE | Disposition: A | Discharge status: Home / Self Care | Payer: Self-pay | Source: Ambulatory Visit | Attending: Plastic Surgery

## 2016-01-07 ENCOUNTER — Encounter (HOSPITAL_BASED_OUTPATIENT_CLINIC_OR_DEPARTMENT_OTHER): Payer: Self-pay | Admitting: Anesthesiology

## 2016-01-07 DIAGNOSIS — S81009A Unspecified open wound, unspecified knee, initial encounter: Secondary | ICD-10-CM | POA: Diagnosis present

## 2016-01-07 DIAGNOSIS — L97319 Non-pressure chronic ulcer of right ankle with unspecified severity: Secondary | ICD-10-CM | POA: Insufficient documentation

## 2016-01-07 DIAGNOSIS — L97329 Non-pressure chronic ulcer of left ankle with unspecified severity: Secondary | ICD-10-CM | POA: Insufficient documentation

## 2016-01-07 DIAGNOSIS — Z87891 Personal history of nicotine dependence: Secondary | ICD-10-CM | POA: Diagnosis not present

## 2016-01-07 DIAGNOSIS — S81809A Unspecified open wound, unspecified lower leg, initial encounter: Secondary | ICD-10-CM

## 2016-01-07 DIAGNOSIS — S91009A Unspecified open wound, unspecified ankle, initial encounter: Secondary | ICD-10-CM | POA: Diagnosis present

## 2016-01-07 DIAGNOSIS — L97829 Non-pressure chronic ulcer of other part of left lower leg with unspecified severity: Secondary | ICD-10-CM | POA: Insufficient documentation

## 2016-01-07 DIAGNOSIS — L97819 Non-pressure chronic ulcer of other part of right lower leg with unspecified severity: Secondary | ICD-10-CM | POA: Insufficient documentation

## 2016-01-07 DIAGNOSIS — I739 Peripheral vascular disease, unspecified: Secondary | ICD-10-CM | POA: Insufficient documentation

## 2016-01-07 HISTORY — PX: I & D EXTREMITY: SHX5045

## 2016-01-07 SURGERY — IRRIGATION AND DEBRIDEMENT EXTREMITY
Anesthesia: General | Laterality: Bilateral

## 2016-01-07 MED ORDER — CIPROFLOXACIN IN D5W 400 MG/200ML IV SOLN
INTRAVENOUS | Status: AC
  Filled 2016-01-07: qty 200

## 2016-01-07 MED ORDER — FENTANYL CITRATE (PF) 100 MCG/2ML IJ SOLN
INTRAMUSCULAR | Status: AC
  Filled 2016-01-07: qty 2

## 2016-01-07 MED ORDER — GLYCOPYRROLATE 0.2 MG/ML IJ SOLN: 0.2000 mg | Freq: Once | INTRAMUSCULAR | Status: DC | PRN

## 2016-01-07 MED ORDER — MIDAZOLAM HCL 2 MG/2ML IJ SOLN
INTRAMUSCULAR | Status: AC
  Filled 2016-01-07: qty 2

## 2016-01-07 MED ORDER — FENTANYL CITRATE (PF) 100 MCG/2ML IJ SOLN: 50.0000 ug | INTRAMUSCULAR | Status: DC | PRN

## 2016-01-07 MED ORDER — MIDAZOLAM HCL 5 MG/5ML IJ SOLN
INTRAMUSCULAR | Status: DC | PRN
  Administered 2016-01-07: 2 mg via INTRAVENOUS

## 2016-01-07 MED ORDER — MIDAZOLAM HCL 2 MG/2ML IJ SOLN: 1.0000 mg | INTRAMUSCULAR | Status: DC | PRN

## 2016-01-07 MED ORDER — GLYCOPYRROLATE 0.2 MG/ML IV SOSY
PREFILLED_SYRINGE | INTRAVENOUS | Status: AC
  Filled 2016-01-07: qty 3

## 2016-01-07 MED ORDER — CHLORHEXIDINE GLUCONATE CLOTH 2 % EX PADS: 6.0000 | MEDICATED_PAD | Freq: Once | CUTANEOUS | Status: DC

## 2016-01-07 MED ORDER — FENTANYL CITRATE (PF) 100 MCG/2ML IJ SOLN
25.0000 ug | INTRAMUSCULAR | Status: DC | PRN
  Administered 2016-01-07 (×4): 50 ug via INTRAVENOUS

## 2016-01-07 MED ORDER — LIDOCAINE-EPINEPHRINE 1 %-1:100000 IJ SOLN
INTRAMUSCULAR | Status: AC
  Filled 2016-01-07: qty 1

## 2016-01-07 MED ORDER — DEXAMETHASONE SODIUM PHOSPHATE 4 MG/ML IJ SOLN
INTRAMUSCULAR | Status: DC | PRN
  Administered 2016-01-07: 10 mg via INTRAVENOUS

## 2016-01-07 MED ORDER — SCOPOLAMINE 1 MG/3DAYS TD PT72: 1.0000 | MEDICATED_PATCH | Freq: Once | TRANSDERMAL | Status: DC | PRN

## 2016-01-07 MED ORDER — FENTANYL CITRATE (PF) 100 MCG/2ML IJ SOLN
INTRAMUSCULAR | Status: DC | PRN
  Administered 2016-01-07: 100 ug via INTRAVENOUS

## 2016-01-07 MED ORDER — HYDROCODONE-ACETAMINOPHEN 5-325 MG PO TABS
ORAL_TABLET | ORAL | Status: AC
  Filled 2016-01-07: qty 1

## 2016-01-07 MED ORDER — ONDANSETRON HCL 4 MG/2ML IJ SOLN
INTRAMUSCULAR | Status: AC
  Filled 2016-01-07: qty 2

## 2016-01-07 MED ORDER — HYDROCODONE-ACETAMINOPHEN 5-325 MG PO TABS
1.0000 | ORAL_TABLET | Freq: Once | ORAL | Status: AC
  Administered 2016-01-07: 1 via ORAL

## 2016-01-07 MED ORDER — ONDANSETRON HCL 4 MG/2ML IJ SOLN
INTRAMUSCULAR | Status: DC | PRN
  Administered 2016-01-07: 4 mg via INTRAVENOUS

## 2016-01-07 MED ORDER — LIDOCAINE 2% (20 MG/ML) 5 ML SYRINGE
INTRAMUSCULAR | Status: AC
  Filled 2016-01-07: qty 5

## 2016-01-07 MED ORDER — EPHEDRINE SULFATE 50 MG/ML IJ SOLN
INTRAMUSCULAR | Status: DC | PRN
  Administered 2016-01-07: 10 mg via INTRAVENOUS

## 2016-01-07 MED ORDER — SODIUM CHLORIDE 0.9 % IR SOLN
Status: DC | PRN
  Administered 2016-01-07: 1000 mL

## 2016-01-07 MED ORDER — PROPOFOL 10 MG/ML IV BOLUS
INTRAVENOUS | Status: AC
  Filled 2016-01-07: qty 20

## 2016-01-07 MED ORDER — PROMETHAZINE HCL 25 MG/ML IJ SOLN: 6.2500 mg | INTRAMUSCULAR | Status: DC | PRN

## 2016-01-07 MED ORDER — LACTATED RINGERS IV SOLN
INTRAVENOUS | Status: DC
  Administered 2016-01-07 (×2): via INTRAVENOUS

## 2016-01-07 MED ORDER — BACITRACIN-NEOMYCIN-POLYMYXIN 400-5-5000 EX OINT
TOPICAL_OINTMENT | CUTANEOUS | Status: AC
  Filled 2016-01-07: qty 1

## 2016-01-07 MED ORDER — DEXAMETHASONE SODIUM PHOSPHATE 10 MG/ML IJ SOLN
INTRAMUSCULAR | Status: AC
  Filled 2016-01-07: qty 1

## 2016-01-07 MED ORDER — BUPIVACAINE HCL (PF) 0.25 % IJ SOLN
INTRAMUSCULAR | Status: AC
  Filled 2016-01-07: qty 30

## 2016-01-07 MED ORDER — PROPOFOL 10 MG/ML IV BOLUS
INTRAVENOUS | Status: DC | PRN
  Administered 2016-01-07: 200 mg via INTRAVENOUS

## 2016-01-07 MED ORDER — CIPROFLOXACIN IN D5W 400 MG/200ML IV SOLN
400.0000 mg | INTRAVENOUS | Status: AC
  Administered 2016-01-07 (×2): 400 mg via INTRAVENOUS

## 2016-01-07 MED ORDER — LIDOCAINE HCL (CARDIAC) 20 MG/ML IV SOLN
INTRAVENOUS | Status: DC | PRN
  Administered 2016-01-07: 50 mg via INTRAVENOUS

## 2016-01-07 SURGICAL SUPPLY — 61 items
BAG DECANTER FOR FLEXI CONT (MISCELLANEOUS) ×4 IMPLANT
BANDAGE ACE 3X5.8 VEL STRL LF (GAUZE/BANDAGES/DRESSINGS) IMPLANT
BANDAGE ACE 4X5 VEL STRL LF (GAUZE/BANDAGES/DRESSINGS) ×2 IMPLANT
BANDAGE ACE 6X5 VEL STRL LF (GAUZE/BANDAGES/DRESSINGS) ×4 IMPLANT
BLADE HEX COATED 2.75 (ELECTRODE) ×2 IMPLANT
BLADE SURG 10 STRL SS (BLADE) IMPLANT
BLADE SURG 15 STRL LF DISP TIS (BLADE) ×1 IMPLANT
BLADE SURG 15 STRL SS (BLADE) ×1
BNDG CONFORM 2 STRL LF (GAUZE/BANDAGES/DRESSINGS) IMPLANT
BNDG CONFORM 3 STRL LF (GAUZE/BANDAGES/DRESSINGS) IMPLANT
BNDG GAUZE ELAST 4 BULKY (GAUZE/BANDAGES/DRESSINGS) ×8 IMPLANT
CANISTER SUCT 1200ML W/VALVE (MISCELLANEOUS) IMPLANT
CHLORAPREP W/TINT 26ML (MISCELLANEOUS) IMPLANT
COVER BACK TABLE 60X90IN (DRAPES) ×2 IMPLANT
DECANTER SPIKE VIAL GLASS SM (MISCELLANEOUS) IMPLANT
DERMABOND ADVANCED (GAUZE/BANDAGES/DRESSINGS)
DERMABOND ADVANCED .7 DNX12 (GAUZE/BANDAGES/DRESSINGS) IMPLANT
DRAPE INCISE IOBAN 66X45 STRL (DRAPES) IMPLANT
DRAPE U-SHAPE 76X120 STRL (DRAPES) ×4 IMPLANT
DRESSING DUODERM 4X4 STERILE (GAUZE/BANDAGES/DRESSINGS) ×2 IMPLANT
DRSG ADAPTIC 3X8 NADH LF (GAUZE/BANDAGES/DRESSINGS) IMPLANT
DRSG EMULSION OIL 3X3 NADH (GAUZE/BANDAGES/DRESSINGS) IMPLANT
DRSG PAD ABDOMINAL 8X10 ST (GAUZE/BANDAGES/DRESSINGS) IMPLANT
ELECT REM PT RETURN 9FT ADLT (ELECTROSURGICAL) ×2
ELECTRODE REM PT RTRN 9FT ADLT (ELECTROSURGICAL) ×1 IMPLANT
GAUZE SPONGE 4X4 12PLY STRL (GAUZE/BANDAGES/DRESSINGS) ×2 IMPLANT
GAUZE XEROFORM 1X8 LF (GAUZE/BANDAGES/DRESSINGS) IMPLANT
GAUZE XEROFORM 5X9 LF (GAUZE/BANDAGES/DRESSINGS) IMPLANT
GLOVE BIO SURGEON STRL SZ 6.5 (GLOVE) ×6 IMPLANT
GLOVE BIO SURGEON STRL SZ8 (GLOVE) IMPLANT
GOWN STRL REUS W/ TWL LRG LVL3 (GOWN DISPOSABLE) ×2 IMPLANT
GOWN STRL REUS W/TWL LRG LVL3 (GOWN DISPOSABLE) ×2
MANIFOLD NEPTUNE II (INSTRUMENTS) IMPLANT
NEEDLE HYPO 25X1 1.5 SAFETY (NEEDLE) IMPLANT
NS IRRIG 1000ML POUR BTL (IV SOLUTION) ×2 IMPLANT
PACK BASIN DAY SURGERY FS (CUSTOM PROCEDURE TRAY) ×2 IMPLANT
PADDING CAST ABS 3INX4YD NS (CAST SUPPLIES)
PADDING CAST ABS 4INX4YD NS (CAST SUPPLIES)
PADDING CAST ABS COTTON 3X4 (CAST SUPPLIES) IMPLANT
PADDING CAST ABS COTTON 4X4 ST (CAST SUPPLIES) IMPLANT
PENCIL BUTTON HOLSTER BLD 10FT (ELECTRODE) ×2 IMPLANT
SHEET MEDIUM DRAPE 40X70 STRL (DRAPES) ×2 IMPLANT
SLEEVE SCD COMPRESS KNEE MED (MISCELLANEOUS) IMPLANT
SPLINT PLASTER CAST XFAST 3X15 (CAST SUPPLIES) IMPLANT
SPLINT PLASTER XTRA FASTSET 3X (CAST SUPPLIES)
SPONGE GAUZE 4X4 12PLY STER LF (GAUZE/BANDAGES/DRESSINGS) ×2 IMPLANT
SPONGE LAP 18X18 X RAY DECT (DISPOSABLE) ×2 IMPLANT
STAPLER VISISTAT 35W (STAPLE) IMPLANT
STRIP CLOSURE SKIN 1/2X4 (GAUZE/BANDAGES/DRESSINGS) IMPLANT
SURGILUBE 2OZ TUBE FLIPTOP (MISCELLANEOUS) IMPLANT
SUT SILK 3 0 PS 1 (SUTURE) IMPLANT
SUT SILK 4 0 PS 2 (SUTURE) IMPLANT
SUT VIC AB 5-0 PS2 18 (SUTURE) ×2 IMPLANT
SYR BULB IRRIGATION 50ML (SYRINGE) ×2 IMPLANT
SYR CONTROL 10ML LL (SYRINGE) IMPLANT
TAPE HYPAFIX 6X30 (GAUZE/BANDAGES/DRESSINGS) IMPLANT
TOWEL OR 17X24 6PK STRL BLUE (TOWEL DISPOSABLE) ×2 IMPLANT
TRAY DSU PREP LF (CUSTOM PROCEDURE TRAY) IMPLANT
TUBE CONNECTING 20X1/4 (TUBING) ×2 IMPLANT
UNDERPAD 30X30 (UNDERPADS AND DIAPERS) ×2 IMPLANT
YANKAUER SUCT BULB TIP NO VENT (SUCTIONS) ×2 IMPLANT

## 2016-01-07 NOTE — Anesthesia Preprocedure Evaluation (Signed)
Anesthesia Evaluation  Patient identified by MRN, date of birth, ID band Patient awake    Reviewed: Allergy & Precautions, NPO status , Patient's Chart, lab work & pertinent test results  Airway Mallampati: II  TM Distance: >3 FB Neck ROM: Full    Dental no notable dental hx.    Pulmonary neg pulmonary ROS, former smoker,    Pulmonary exam normal breath sounds clear to auscultation       Cardiovascular + Peripheral Vascular Disease  negative cardio ROS Normal cardiovascular exam Rhythm:Regular Rate:Normal     Neuro/Psych negative neurological ROS  negative psych ROS   GI/Hepatic negative GI ROS, Neg liver ROS,   Endo/Other  negative endocrine ROS  Renal/GU negative Renal ROS  negative genitourinary   Musculoskeletal negative musculoskeletal ROS (+)   Abdominal   Peds negative pediatric ROS (+)  Hematology negative hematology ROS (+)   Anesthesia Other Findings   Reproductive/Obstetrics negative OB ROS                             Anesthesia Physical Anesthesia Plan  ASA: II  Anesthesia Plan: General   Post-op Pain Management:    Induction: Intravenous  Airway Management Planned: LMA  Additional Equipment:   Intra-op Plan:   Post-operative Plan: Extubation in OR  Informed Consent: I have reviewed the patients History and Physical, chart, labs and discussed the procedure including the risks, benefits and alternatives for the proposed anesthesia with the patient or authorized representative who has indicated his/her understanding and acceptance.   Dental advisory given  Plan Discussed with: CRNA  Anesthesia Plan Comments: (LMA 3 on 12-12-15)        Anesthesia Quick Evaluation

## 2016-01-07 NOTE — Anesthesia Procedure Notes (Signed)
Procedure Name: LMA Insertion Date/Time: 01/07/2016 8:30 AM Performed by: Toula Moos L Pre-anesthesia Checklist: Patient identified, Emergency Drugs available, Suction available, Patient being monitored and Timeout performed Patient Re-evaluated:Patient Re-evaluated prior to inductionOxygen Delivery Method: Circle system utilized Preoxygenation: Pre-oxygenation with 100% oxygen Intubation Type: IV induction Ventilation: Mask ventilation without difficulty LMA: LMA inserted LMA Size: 4.0 Number of attempts: 1 Airway Equipment and Method: Bite block Placement Confirmation: positive ETCO2 Tube secured with: Tape Dental Injury: Teeth and Oropharynx as per pre-operative assessment

## 2016-01-07 NOTE — Op Note (Signed)
Operative Note   DATE OF OPERATION: 01/07/2016  LOCATION: [place]  SURGICAL DIVISION: Plastic Surgery  PREOPERATIVE DIAGNOSES:  Bilateral leg wound  POSTOPERATIVE DIAGNOSES:  same  PROCEDURE:  Excisional debridement skin and subcutaneous tissue of right leg/ankle wound (7 x 7 cm) and Right leg/ankle wounds (7 x 9 cm)  SURGEON: Claire Sanger Dillingham, DO  ANESTHESIA:  General.   COMPLICATIONS: None.   INDICATIONS FOR PROCEDURE:  The patient, [name] is a 60 y.o. female born on Mar 01, 1956, is here for treatment of chronic bilateral leg ulcers. MRN: [id number]  CONSENT:  Informed consent was obtained directly from the patient. Risks, benefits and alternatives were fully discussed. Specific risks including but not limited to bleeding, infection, hematoma, seroma, scarring, pain, infection, contracture, asymmetry, wound healing problems, and need for further surgery were all discussed. The patient did have an ample opportunity to have questions answered to satisfaction.   DESCRIPTION OF PROCEDURE:  The patient was taken to the operating room. Antibiotics were given. The patient's operative site was prepped and draped in a sterile fashion. A time out was performed and all information was confirmed to be correct.  General anesthesia was administered.    Right: The medial leg / ankle and leg wound was debrided sharply with the #10 blade and curette 7 x 7 cm.   The area was then irrigated with antibiotic solution.  Hemostasis was achieved with pressure.  There was good blood supply.  Left: The medial leg / ankle and leg wound was debrided sharply with the #10 blade and curette 7 x 9 cm.  The area was then irrigated with antibiotic solution.  Hemostasis was achieved with pressure.  There was good blood supply.  Duoderm was placed around the wound to protect the adjacent skin from irritation. The patient tolerated the procedure well.  There were no  complications. The patient was allowed to wake from anesthesia, extubated and taken to the recovery room in satisfactory condition.

## 2016-01-07 NOTE — Interval H&P Note (Signed)
History and Physical Interval Note:  01/07/2016 8:18 AM  [name]  has presented today for surgery, with the diagnosis of BILATERAL LEG WOUND  The various methods of treatment have been discussed with the patient and family. After consideration of risks, benefits and other options for treatment, the patient has consented to  Procedure(s): IRRIGATION AND DEBRIDEMENT OF BILATERAL LEGS (Bilateral) as a surgical intervention .  The patient's history has been reviewed, patient examined, no change in status, stable for surgery.  I have reviewed the patient's chart and labs.  Questions were answered to the patient's satisfaction.     [name]

## 2016-01-07 NOTE — Discharge Instructions (Signed)
Do not change dressing. Office visit tomorrow morning.     Post Anesthesia Home Care Instructions  Activity: Get plenty of rest for the remainder of the day. A responsible adult should stay with you for 24 hours following the procedure.  For the next 24 hours, DO NOT: -Drive a car -Paediatric nurse -Drink alcoholic beverages -Take any medication unless instructed by your physician -Make any legal decisions or sign important papers.  Meals: Start with liquid foods such as gelatin or soup. Progress to regular foods as tolerated. Avoid greasy, spicy, heavy foods. If nausea and/or vomiting occur, drink only clear liquids until the nausea and/or vomiting subsides. Call your physician if vomiting continues.  Special Instructions/Symptoms: Your throat may feel dry or sore from the anesthesia or the breathing tube placed in your throat during surgery. If this causes discomfort, gargle with warm salt water. The discomfort should disappear within 24 hours.  If you had a scopolamine patch placed behind your ear for the management of post- operative nausea and/or vomiting:  1. The medication in the patch is effective for 72 hours, after which it should be removed.  Wrap patch in a tissue and discard in the trash. Wash hands thoroughly with soap and water. 2. You may remove the patch earlier than 72 hours if you experience unpleasant side effects which may include dry mouth, dizziness or visual disturbances. 3. Avoid touching the patch. Wash your hands with soap and water after contact with the patch.

## 2016-01-07 NOTE — Anesthesia Postprocedure Evaluation (Signed)
Anesthesia Post Note  Patient: [name]  Procedure(s) Performed: Procedure(s) (LRB): IRRIGATION AND DEBRIDEMENT OF BILATERAL LEGS (Bilateral)  Patient location during evaluation: PACU Anesthesia Type: General Level of consciousness: awake and alert Pain management: pain level controlled Vital Signs Assessment: post-procedure vital signs reviewed and stable Respiratory status: spontaneous breathing, nonlabored ventilation, respiratory function stable and patient connected to nasal cannula oxygen Cardiovascular status: blood pressure returned to baseline and stable Postop Assessment: no signs of nausea or vomiting Anesthetic complications: no    Last Vitals:  Vitals:   01/07/16 1015 01/07/16 1115  BP: 136/60 (!) 121/41  Pulse: 64 61  Resp: 15 14  Temp:  36.8 C    Last Pain:  Vitals:   01/07/16 1115  TempSrc:   PainSc: 5                  Priyal Musquiz J

## 2016-01-07 NOTE — Brief Op Note (Signed)
01/07/2016  8:31 AM  PATIENT:  [name]  60 y.o. female  PRE-OPERATIVE DIAGNOSIS:  BILATERAL LEG WOUND  POST-OPERATIVE DIAGNOSIS:  same  PROCEDURE:  Procedure(s): IRRIGATION AND DEBRIDEMENT OF BILATERAL LEGS (Bilateral)  SURGEON:  Surgeon(s) and Role:    * Claire S Dillingham, DO - Primary  PHYSICIAN ASSISTANT: none  ASSISTANTS: none   ANESTHESIA:   general  EBL:  No intake/output data recorded.  BLOOD ADMINISTERED:none  DRAINS: none   LOCAL MEDICATIONS USED:  NONE  SPECIMEN:  No Specimen  DISPOSITION OF SPECIMEN:  N/A  COUNTS:  YES  TOURNIQUET:    DICTATION: .Dragon Dictation  PLAN OF CARE: Discharge to home after PACU  PATIENT DISPOSITION:  PACU - hemodynamically stable.   Delay start of Pharmacological VTE agent (>24hrs) due to surgical blood loss or risk of bleeding: no

## 2016-01-07 NOTE — Transfer of Care (Signed)
Immediate Anesthesia Transfer of Care Note  Patient: [name]  Procedure(s) Performed: Procedure(s): IRRIGATION AND DEBRIDEMENT OF BILATERAL LEGS (Bilateral)  Patient Location: PACU  Anesthesia Type:General  Level of Consciousness: awake and patient cooperative  Airway & Oxygen Therapy: Patient Spontanous Breathing and Patient connected to face mask oxygen  Post-op Assessment: Report given to RN and Post -op Vital signs reviewed and stable  Post vital signs: Reviewed and stable  Last Vitals:  Vitals:   01/07/16 0913 01/07/16 0914  BP:    Pulse:  80  Resp: 11 17  Temp:      Last Pain:  Vitals:   01/07/16 0732  TempSrc: Oral  PainSc: 7       Patients Stated Pain Goal: 2 ([id number])  Complications: No apparent anesthesia complications

## 2016-01-07 NOTE — H&P (View-Only) (Signed)
[name] is an 60 y.o. female.   Chief Complaint: bilateral leg ulcers HPI: The patient is a 60 y.o. 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  She states that she has been dealing with wounds on her legs for the past 9 years.  About 1 year ago they got worse.  They are very painful.  She had a consult with Dr. Scot Dock and was told there were no blockages.  She has been seeing Dr. Dellia Nims at the [facility] for the past year.  She has had multiple attempts at closure with Apligraf and other materials.  They have not gotten better. She complains of severe pain.  She currently takes norco for the pain.  She does smoke.  She denies any other drug use.  She denies diabetes or hear disease.  The areas are ~ 5 x 7 cm on the medial aspect of both ankles.  There is exudate and film over the area.  It is painful to tough.  Pulses are strong, regular and equal.  Past Medical History  Diagnosis Date  . Peripheral vascular disease (HCC)     stasis ulcers BLE    Past Surgical History  Procedure Laterality Date  . Ectopic pregnancy surgery  1997  . Tonsillectomy      as a child  . Vein ligation and stripping Left late 1990's    MD in [place]; left ankle  . Peripheral vascular catheterization N/A 10/15/2015    Procedure: Abdominal Aortogram w/Lower Extremity;  Surgeon: Angelia Mould, MD;  Location: [place] [facility];  Service: Cardiovascular;  Laterality: N/A;    Family History  Problem Relation Age of Onset  . Diabetes Maternal Grandmother   . Diabetes Paternal Grandmother    Social History:  reports that she quit smoking about 8 years ago. Her smoking use included Cigarettes. She has a 6.5 pack-year smoking history. She has never used smokeless tobacco. She reports that she drinks alcohol. She reports that she does not use illicit drugs.  Allergies:  Allergies  Allergen  Reactions  . Penicillins Hives    No prescriptions prior to admission    No results found for this or any previous visit (from the past 53 hour(s)). No results found.  Review of Systems  Constitutional: Negative.   HENT: Negative.   Eyes: Negative.   Respiratory: Negative.   Cardiovascular: Negative.   Gastrointestinal: Negative.   Genitourinary: Negative.   Musculoskeletal: Negative.   Skin: Negative.   Psychiatric/Behavioral: Negative.     Height 5\' 8"  (1.727 m), weight 54.432 kg (120 lb). Physical Exam  Constitutional: She is oriented to person, place, and time. She appears well-developed and well-nourished.  HENT:  Head: Normocephalic and atraumatic.  Eyes: Conjunctivae and EOM are normal. Pupils are equal, round, and reactive to light.  Cardiovascular: Normal rate.   Respiratory: Effort normal.  GI: Soft. She exhibits no distension.  Musculoskeletal:       Legs: Neurological: She is alert and oriented to person, place, and time.  Skin: Skin is warm.  Psychiatric: She has a normal mood and affect. Her behavior is normal. Judgment and thought content normal.     Assessment/Plan Recommend debridement under anesthesia and a VAC on bilateral lower extremities.  Increase protein, multivitamin, vit C and Zinc.  No smoking.  We discussed that we are not a pain service and will not  be providing pain medication of any type at any time. The consent was obtained with risks and complications reviewed which included bleeding, pain, scar, infection and the risk of anesthesia.  The patients questions were answered to the patients expressed satisfaction. Will try to arrange for [facility] RN for [facility] dressing changes.       [name], DO 12/12/2015, 9:43 AM

## 2016-01-09 ENCOUNTER — Encounter (HOSPITAL_BASED_OUTPATIENT_CLINIC_OR_DEPARTMENT_OTHER): Payer: Self-pay | Admitting: Plastic Surgery

## 2016-01-24 ENCOUNTER — Encounter (HOSPITAL_BASED_OUTPATIENT_CLINIC_OR_DEPARTMENT_OTHER): Payer: Commercial Managed Care - HMO

## 2016-01-31 ENCOUNTER — Encounter (HOSPITAL_BASED_OUTPATIENT_CLINIC_OR_DEPARTMENT_OTHER): Payer: Commercial Managed Care - HMO | Attending: Internal Medicine

## 2016-01-31 DIAGNOSIS — L97321 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 breakdown of skin: Secondary | ICD-10-CM | POA: Insufficient documentation

## 2016-01-31 DIAGNOSIS — L97312 Non-pressure chronic ulcer of right ankle with fat layer exposed: Secondary | ICD-10-CM | POA: Insufficient documentation

## 2016-01-31 DIAGNOSIS — I83223 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 and inflammation: Secondary | ICD-10-CM | POA: Insufficient documentation

## 2016-01-31 DIAGNOSIS — I83213 Varicose veins of right lower extremity with both ulcer of ankle and inflammation: Secondary | ICD-10-CM | POA: Insufficient documentation

## 2016-01-31 DIAGNOSIS — L03116 Cellulitis of left lower limb: Secondary | ICD-10-CM | POA: Insufficient documentation

## 2016-02-01 DIAGNOSIS — S91009A Unspecified open wound, unspecified ankle, initial encounter: Secondary | ICD-10-CM

## 2016-02-01 HISTORY — DX: Unspecified open wound, unspecified ankle, initial encounter: S91.009A

## 2016-02-08 ENCOUNTER — Encounter (HOSPITAL_BASED_OUTPATIENT_CLINIC_OR_DEPARTMENT_OTHER): Payer: Self-pay | Admitting: *Deleted

## 2016-02-12 ENCOUNTER — Ambulatory Visit: Payer: Self-pay | Admitting: Plastic Surgery

## 2016-02-12 DIAGNOSIS — S81809A Unspecified open wound, unspecified lower leg, initial encounter: Principal | ICD-10-CM

## 2016-02-12 DIAGNOSIS — S91009A Unspecified open wound, unspecified ankle, initial encounter: Principal | ICD-10-CM

## 2016-02-12 DIAGNOSIS — S81009A Unspecified open wound, unspecified knee, initial encounter: Secondary | ICD-10-CM

## 2016-02-12 NOTE — H&P (Signed)
[name] is an 60 y.o. female.   Chief Complaint: bilateral ankle wounds HPI: The patient is a 60 yo female who is seen for bilateral chronic ankle wounds. She underwent the procedure 12/12/15 and the [facility] dressings were placed. She has undergone another debridement on 01/07/16 and comes in today for Acell placement to the left leg wound and VAC placement.  At today's visit, she reports she has been doing well with the dressing changes.   Past Medical History:  Diagnosis Date  . Ankle wound 02/2016   bilateral  . Dental bridge present    lower  . Dental crowns present    upper right  . Peripheral vascular disease (HCC)    venous stasis ulcers bilateral lower extremities  . PONV (postoperative nausea and vomiting)     Past Surgical History:  Procedure Laterality Date  . APPLICATION OF WOUND VAC Bilateral 12/12/2015   Procedure: APPLICATION OF WOUND VAC;  Surgeon: [name], DO;  Location: [place];  Service: Plastics;  Laterality: Bilateral;  . [place]  . I&D EXTREMITY Bilateral 12/12/2015   Procedure: DEBRIDEMENT OF BILATERAL LEG WOUNDS;  Surgeon: [name], DO;  Location: [place];  Service: Plastics;  Laterality: Bilateral;  . I&D EXTREMITY Bilateral 01/07/2016   Procedure: IRRIGATION AND DEBRIDEMENT OF BILATERAL LEGS;  Surgeon: [name], DO;  Location: [place];  Service: Plastics;  Laterality: Bilateral;  . PERIPHERAL VASCULAR CATHETERIZATION N/A 10/15/2015   Procedure: Abdominal Aortogram w/Lower Extremity;  Surgeon: Angelia Mould, MD;  Location: [place] [facility];  Service: Cardiovascular;  Laterality: N/A;  . TONSILLECTOMY     as a child  . VEIN LIGATION AND STRIPPING Left late 1990's    Family History  Problem Relation Age of Onset  . Diabetes Maternal Grandmother   . Diabetes Paternal Grandmother    Social History:  reports that she quit smoking about 8 years  ago. Her smoking use included Cigarettes. She has a 6.50 pack-year smoking history. She has never used smokeless tobacco. She reports that she drinks alcohol. She reports that she does not use drugs.  Allergies:  Allergies  Allergen Reactions  . Penicillins Hives and Itching     (Not in a hospital admission)  No results found for this or any previous visit (from the past 48 hour(s)). No results found.  Review of Systems  Constitutional: Negative.   HENT: Negative.   Eyes: Negative.   Respiratory: Negative.   Cardiovascular: Negative.   Gastrointestinal: Negative.   Genitourinary: Negative.   Musculoskeletal: Negative.   Skin: Negative.   Neurological: Negative.   Psychiatric/Behavioral: Negative.     There were no vitals taken for this visit. Physical Exam  Constitutional: She is oriented to person, place, and time. She appears well-developed and well-nourished.  HENT:  Head: Normocephalic and atraumatic.  Eyes: EOM are normal. Pupils are equal, round, and reactive to light.  Cardiovascular: Normal rate.   Respiratory: Effort normal.  GI: She exhibits no distension.  Neurological: She is alert and oriented to person, place, and time.  Skin: Skin is warm.  Psychiatric: She has a normal mood and affect. Her behavior is normal. Judgment and thought content normal.     Assessment/Plan Plan skin graft to bilateral ankle wound.  [name], DO 02/12/2016, 7:49 AM

## 2016-02-14 ENCOUNTER — Ambulatory Visit (HOSPITAL_BASED_OUTPATIENT_CLINIC_OR_DEPARTMENT_OTHER)
Admission: RE | Admit: 2016-02-14 | Discharge: 2016-02-14 | Disposition: A | Discharge status: Home / Self Care | Payer: Commercial Managed Care - HMO | Source: Ambulatory Visit | Attending: Plastic Surgery | Admitting: Plastic Surgery

## 2016-02-14 ENCOUNTER — Encounter (HOSPITAL_BASED_OUTPATIENT_CLINIC_OR_DEPARTMENT_OTHER)
Admission: RE | Disposition: A | Discharge status: Home / Self Care | Payer: Self-pay | Source: Ambulatory Visit | Attending: Plastic Surgery

## 2016-02-14 ENCOUNTER — Ambulatory Visit (HOSPITAL_BASED_OUTPATIENT_CLINIC_OR_DEPARTMENT_OTHER): Payer: Commercial Managed Care - HMO | Admitting: Anesthesiology

## 2016-02-14 ENCOUNTER — Encounter (HOSPITAL_BASED_OUTPATIENT_CLINIC_OR_DEPARTMENT_OTHER): Payer: Self-pay

## 2016-02-14 DIAGNOSIS — I7025 Atherosclerosis of native arteries of other extremities with ulceration: Secondary | ICD-10-CM | POA: Insufficient documentation

## 2016-02-14 DIAGNOSIS — Z880 Allergy status to penicillin: Secondary | ICD-10-CM | POA: Diagnosis not present

## 2016-02-14 DIAGNOSIS — I70203 Unspecified atherosclerosis of native arteries of extremities, bilateral legs: Secondary | ICD-10-CM | POA: Diagnosis present

## 2016-02-14 DIAGNOSIS — Z87891 Personal history of nicotine dependence: Secondary | ICD-10-CM | POA: Insufficient documentation

## 2016-02-14 DIAGNOSIS — L98499 Non-pressure chronic ulcer of skin of other sites with unspecified severity: Secondary | ICD-10-CM | POA: Diagnosis not present

## 2016-02-14 DIAGNOSIS — S81809A Unspecified open wound, unspecified lower leg, initial encounter: Secondary | ICD-10-CM

## 2016-02-14 DIAGNOSIS — S91009A Unspecified open wound, unspecified ankle, initial encounter: Secondary | ICD-10-CM

## 2016-02-14 DIAGNOSIS — S81009A Unspecified open wound, unspecified knee, initial encounter: Secondary | ICD-10-CM

## 2016-02-14 HISTORY — PX: DRESSING CHANGE UNDER ANESTHESIA: SHX5237

## 2016-02-14 HISTORY — DX: Unspecified open wound, unspecified ankle, initial encounter: S91.009A

## 2016-02-14 HISTORY — DX: Other specified postprocedural states: Z98.890

## 2016-02-14 HISTORY — PX: WOUND DEBRIDEMENT: SHX247

## 2016-02-14 HISTORY — DX: Presence of dental prosthetic device (complete) (partial): Z97.2

## 2016-02-14 HISTORY — DX: Nausea with vomiting, unspecified: R11.2

## 2016-02-14 HISTORY — DX: Dental restoration status: Z98.811

## 2016-02-14 SURGERY — DEBRIDEMENT, WOUND
Anesthesia: General | Site: Leg Lower | Laterality: Right

## 2016-02-14 MED ORDER — ONDANSETRON HCL 4 MG/2ML IJ SOLN
INTRAMUSCULAR | Status: DC | PRN
  Administered 2016-02-14: 4 mg via INTRAVENOUS

## 2016-02-14 MED ORDER — LACTATED RINGERS IV SOLN
INTRAVENOUS | Status: DC
  Administered 2016-02-14: 10:00:00 via INTRAVENOUS

## 2016-02-14 MED ORDER — PROPOFOL 500 MG/50ML IV EMUL
INTRAVENOUS | Status: AC
  Filled 2016-02-14: qty 50

## 2016-02-14 MED ORDER — MEPERIDINE HCL 25 MG/ML IJ SOLN: 6.2500 mg | INTRAMUSCULAR | Status: DC | PRN

## 2016-02-14 MED ORDER — PROPOFOL 10 MG/ML IV BOLUS
INTRAVENOUS | Status: AC
  Filled 2016-02-14: qty 20

## 2016-02-14 MED ORDER — CHLORHEXIDINE GLUCONATE CLOTH 2 % EX PADS: 6.0000 | MEDICATED_PAD | Freq: Once | CUTANEOUS | Status: DC

## 2016-02-14 MED ORDER — SUCCINYLCHOLINE CHLORIDE 200 MG/10ML IV SOSY
PREFILLED_SYRINGE | INTRAVENOUS | Status: AC
  Filled 2016-02-14: qty 10

## 2016-02-14 MED ORDER — CIPROFLOXACIN IN D5W 400 MG/200ML IV SOLN
400.0000 mg | INTRAVENOUS | Status: AC
  Administered 2016-02-14 (×2): 400 mg via INTRAVENOUS

## 2016-02-14 MED ORDER — LACTATED RINGERS IV SOLN: INTRAVENOUS | Status: DC

## 2016-02-14 MED ORDER — PROMETHAZINE HCL 25 MG/ML IJ SOLN: 6.2500 mg | INTRAMUSCULAR | Status: DC | PRN

## 2016-02-14 MED ORDER — OXYCODONE HCL 5 MG PO TABS: 5.0000 mg | ORAL_TABLET | Freq: Once | ORAL | Status: DC | PRN

## 2016-02-14 MED ORDER — LIDOCAINE 2% (20 MG/ML) 5 ML SYRINGE
INTRAMUSCULAR | Status: AC
  Filled 2016-02-14: qty 5

## 2016-02-14 MED ORDER — LIDOCAINE HCL (CARDIAC) 20 MG/ML IV SOLN
INTRAVENOUS | Status: DC | PRN
  Administered 2016-02-14: 30 mg via INTRAVENOUS

## 2016-02-14 MED ORDER — HYDROMORPHONE HCL 1 MG/ML IJ SOLN
INTRAMUSCULAR | Status: AC
  Filled 2016-02-14: qty 1

## 2016-02-14 MED ORDER — HYDROCODONE-ACETAMINOPHEN 5-325 MG PO TABS: 1.0000 | ORAL_TABLET | Freq: Four times a day (QID) | ORAL | 0 refills | Status: DC | PRN

## 2016-02-14 MED ORDER — FENTANYL CITRATE (PF) 100 MCG/2ML IJ SOLN
50.0000 ug | INTRAMUSCULAR | Status: DC | PRN
  Administered 2016-02-14: 50 ug via INTRAVENOUS

## 2016-02-14 MED ORDER — BUPIVACAINE-EPINEPHRINE 0.25% -1:200000 IJ SOLN
INTRAMUSCULAR | Status: DC | PRN
  Administered 2016-02-14: 10 mL

## 2016-02-14 MED ORDER — ONDANSETRON HCL 4 MG/2ML IJ SOLN
INTRAMUSCULAR | Status: AC
  Filled 2016-02-14: qty 2

## 2016-02-14 MED ORDER — HYDROMORPHONE HCL 1 MG/ML IJ SOLN
0.2500 mg | INTRAMUSCULAR | Status: DC | PRN
  Administered 2016-02-14 (×2): 0.5 mg via INTRAVENOUS

## 2016-02-14 MED ORDER — EPHEDRINE 5 MG/ML INJ
INTRAVENOUS | Status: AC
  Filled 2016-02-14: qty 10

## 2016-02-14 MED ORDER — PHENYLEPHRINE 40 MCG/ML (10ML) SYRINGE FOR IV PUSH (FOR BLOOD PRESSURE SUPPORT)
PREFILLED_SYRINGE | INTRAVENOUS | Status: AC
  Filled 2016-02-14: qty 10

## 2016-02-14 MED ORDER — PROPOFOL 10 MG/ML IV BOLUS
INTRAVENOUS | Status: DC | PRN
  Administered 2016-02-14: 150 mg via INTRAVENOUS

## 2016-02-14 MED ORDER — CIPROFLOXACIN IN D5W 400 MG/200ML IV SOLN
INTRAVENOUS | Status: AC
  Filled 2016-02-14: qty 200

## 2016-02-14 MED ORDER — OXYCODONE HCL 5 MG/5ML PO SOLN: 5.0000 mg | Freq: Once | ORAL | Status: DC | PRN

## 2016-02-14 MED ORDER — FENTANYL CITRATE (PF) 100 MCG/2ML IJ SOLN
INTRAMUSCULAR | Status: AC
  Filled 2016-02-14: qty 2

## 2016-02-14 MED ORDER — SODIUM CHLORIDE 0.9 % IR SOLN
Status: DC | PRN
  Administered 2016-02-14: 500 mL

## 2016-02-14 MED ORDER — GLYCOPYRROLATE 0.2 MG/ML IJ SOLN: 0.2000 mg | Freq: Once | INTRAMUSCULAR | Status: DC | PRN

## 2016-02-14 MED ORDER — DEXAMETHASONE SODIUM PHOSPHATE 10 MG/ML IJ SOLN
INTRAMUSCULAR | Status: AC
  Filled 2016-02-14: qty 1

## 2016-02-14 MED ORDER — EPHEDRINE SULFATE 50 MG/ML IJ SOLN
INTRAMUSCULAR | Status: DC | PRN
  Administered 2016-02-14: 15 mg via INTRAVENOUS

## 2016-02-14 MED ORDER — SCOPOLAMINE 1 MG/3DAYS TD PT72: 1.0000 | MEDICATED_PATCH | Freq: Once | TRANSDERMAL | Status: DC | PRN

## 2016-02-14 MED ORDER — DEXAMETHASONE SODIUM PHOSPHATE 4 MG/ML IJ SOLN
INTRAMUSCULAR | Status: DC | PRN
  Administered 2016-02-14: 8 mg via INTRAVENOUS

## 2016-02-14 MED ORDER — MIDAZOLAM HCL 2 MG/2ML IJ SOLN
1.0000 mg | INTRAMUSCULAR | Status: DC | PRN
  Administered 2016-02-14: 1 mg via INTRAVENOUS

## 2016-02-14 MED ORDER — MIDAZOLAM HCL 2 MG/2ML IJ SOLN
INTRAMUSCULAR | Status: AC
  Filled 2016-02-14: qty 2

## 2016-02-14 SURGICAL SUPPLY — 63 items
BAG DECANTER FOR FLEXI CONT (MISCELLANEOUS) ×4 IMPLANT
BANDAGE ACE 3X5.8 VEL STRL LF (GAUZE/BANDAGES/DRESSINGS) IMPLANT
BANDAGE ACE 4X5 VEL STRL LF (GAUZE/BANDAGES/DRESSINGS) ×4 IMPLANT
BANDAGE ACE 6X5 VEL STRL LF (GAUZE/BANDAGES/DRESSINGS) IMPLANT
BLADE CLIPPER SURG (BLADE) IMPLANT
BLADE DERMATOME SS (BLADE) ×4 IMPLANT
BLADE HEX COATED 2.75 (ELECTRODE) IMPLANT
BLADE SURG 10 STRL SS (BLADE) IMPLANT
BLADE SURG 15 STRL LF DISP TIS (BLADE) ×3 IMPLANT
BLADE SURG 15 STRL SS (BLADE) ×1
BNDG GAUZE ELAST 4 BULKY (GAUZE/BANDAGES/DRESSINGS) ×16 IMPLANT
COTTONBALL LRG STERILE PKG (GAUZE/BANDAGES/DRESSINGS) IMPLANT
COVER BACK TABLE 60X90IN (DRAPES) ×4 IMPLANT
COVER MAYO STAND STRL (DRAPES) ×4 IMPLANT
DECANTER SPIKE VIAL GLASS SM (MISCELLANEOUS) IMPLANT
DERMABOND ADVANCED (GAUZE/BANDAGES/DRESSINGS)
DERMABOND ADVANCED .7 DNX12 (GAUZE/BANDAGES/DRESSINGS) IMPLANT
DERMACARRIERS GRAFT 1 TO 1.5 (DISPOSABLE)
DRAPE INCISE IOBAN 66X45 STRL (DRAPES) ×4 IMPLANT
DRAPE U-SHAPE 76X120 STRL (DRAPES) ×4 IMPLANT
DRESSING DUODERM 4X4 STERILE (GAUZE/BANDAGES/DRESSINGS) ×4 IMPLANT
DRSG ADAPTIC 3X8 NADH LF (GAUZE/BANDAGES/DRESSINGS) ×4 IMPLANT
DRSG EMULSION OIL 3X3 NADH (GAUZE/BANDAGES/DRESSINGS) IMPLANT
DRSG OPSITE 6X11 MED (GAUZE/BANDAGES/DRESSINGS) IMPLANT
DRSG PAD ABDOMINAL 8X10 ST (GAUZE/BANDAGES/DRESSINGS) IMPLANT
DRSG TEGADERM 4X10 (GAUZE/BANDAGES/DRESSINGS) ×4 IMPLANT
ELECT REM PT RETURN 9FT ADLT (ELECTROSURGICAL) ×4
ELECTRODE REM PT RTRN 9FT ADLT (ELECTROSURGICAL) ×3 IMPLANT
GAUZE SPONGE 4X4 12PLY STRL (GAUZE/BANDAGES/DRESSINGS) IMPLANT
GAUZE SPONGE 4X4 16PLY XRAY LF (GAUZE/BANDAGES/DRESSINGS) ×8 IMPLANT
GAUZE XEROFORM 5X9 LF (GAUZE/BANDAGES/DRESSINGS) ×4 IMPLANT
GLOVE BIO SURGEON STRL SZ 6.5 (GLOVE) ×4 IMPLANT
GOWN STRL REUS W/ TWL LRG LVL3 (GOWN DISPOSABLE) ×6 IMPLANT
GOWN STRL REUS W/TWL LRG LVL3 (GOWN DISPOSABLE) ×2
GRAFT DERMACARRIERS 1 TO 1.5 (DISPOSABLE) IMPLANT
NEEDLE HYPO 25X1 1.5 SAFETY (NEEDLE) ×4 IMPLANT
NS IRRIG 1000ML POUR BTL (IV SOLUTION) ×4 IMPLANT
PACK BASIN DAY SURGERY FS (CUSTOM PROCEDURE TRAY) ×4 IMPLANT
PADDING CAST ABS 3INX4YD NS (CAST SUPPLIES)
PADDING CAST ABS 4INX4YD NS (CAST SUPPLIES)
PADDING CAST ABS COTTON 3X4 (CAST SUPPLIES) IMPLANT
PADDING CAST ABS COTTON 4X4 ST (CAST SUPPLIES) IMPLANT
PENCIL BUTTON HOLSTER BLD 10FT (ELECTRODE) IMPLANT
SHEET MEDIUM DRAPE 40X70 STRL (DRAPES) ×4 IMPLANT
SPLINT FIBERGLASS 3X35 (CAST SUPPLIES) ×4 IMPLANT
SPLINT FIBERGLASS 4X30 (CAST SUPPLIES) IMPLANT
SPONGE LAP 18X18 X RAY DECT (DISPOSABLE) ×8 IMPLANT
STAPLER VISISTAT 35W (STAPLE) IMPLANT
SURGILUBE 2OZ TUBE FLIPTOP (MISCELLANEOUS) IMPLANT
SUT MON AB 5-0 PS2 18 (SUTURE) ×4 IMPLANT
SUT SILK 3 0 SH CR/8 (SUTURE) IMPLANT
SUT SILK 4 0 PS 2 (SUTURE) IMPLANT
SUT VIC AB 5-0 P-3 18X BRD (SUTURE) IMPLANT
SUT VIC AB 5-0 P3 18 (SUTURE)
SUT VIC AB 5-0 PS2 18 (SUTURE) ×12 IMPLANT
SUT VICRYL 4-0 PS2 18IN ABS (SUTURE) IMPLANT
SYR BULB IRRIGATION 50ML (SYRINGE) ×4 IMPLANT
SYR CONTROL 10ML LL (SYRINGE) ×4 IMPLANT
TOWEL OR 17X24 6PK STRL BLUE (TOWEL DISPOSABLE) ×4 IMPLANT
TRAY DSU PREP LF (CUSTOM PROCEDURE TRAY) ×4 IMPLANT
TUBE CONNECTING 20X1/4 (TUBING) ×4 IMPLANT
UNDERPAD 30X30 (UNDERPADS AND DIAPERS) IMPLANT
YANKAUER SUCT BULB TIP NO VENT (SUCTIONS) ×4 IMPLANT

## 2016-02-14 NOTE — Anesthesia Postprocedure Evaluation (Signed)
Anesthesia Post Note  Patient: [name]  Procedure(s) Performed: Procedure(s) (LRB): SKIN GRAFT SPLIT THICKNESS TO LEFT ANKLE (Left) DRESSING CHANGE UNDER ANESTHESIA (Right)  Patient location during evaluation: PACU Anesthesia Type: General Level of consciousness: awake and alert Pain management: pain level controlled Vital Signs Assessment: post-procedure vital signs reviewed and stable Respiratory status: spontaneous breathing, nonlabored ventilation, respiratory function stable and patient connected to nasal cannula oxygen Cardiovascular status: blood pressure returned to baseline and stable Postop Assessment: no signs of nausea or vomiting Anesthetic complications: no    Last Vitals:  Vitals:   02/14/16 1300 02/14/16 1315  BP: (!) 95/40 (!) 101/44  Pulse: (!) 55 (!) 57  Resp: 11 13  Temp:      Last Pain:  Vitals:   02/14/16 1315  PainSc: [place] Gianmarco [place]

## 2016-02-14 NOTE — Anesthesia Preprocedure Evaluation (Addendum)
Anesthesia Evaluation  Patient identified by MRN, date of birth, ID band Patient awake    Reviewed: Allergy & Precautions, NPO status , Patient's Chart, lab work & pertinent test results  History of Anesthesia Complications (+) PONV and history of anesthetic complications  Airway Mallampati: I  TM Distance: >3 FB Neck ROM: Full    Dental  (+) Teeth Intact, Dental Advisory Given   Pulmonary former smoker,    breath sounds clear to auscultation       Cardiovascular + Peripheral Vascular Disease   Rhythm:Regular Rate:Normal     Neuro/Psych negative neurological ROS  negative psych ROS   GI/Hepatic negative GI ROS, Neg liver ROS,   Endo/Other  negative endocrine ROS  Renal/GU negative Renal ROS  negative genitourinary   Musculoskeletal negative musculoskeletal ROS (+)   Abdominal   Peds negative pediatric ROS (+)  Hematology negative hematology ROS (+)   Anesthesia Other Findings   Reproductive/Obstetrics negative OB ROS                           Lab Results  Component Value Date   HGB 13.3 10/15/2015   HCT 39.0 10/15/2015   Lab Results  Component Value Date   CREATININE 0.70 10/15/2015   BUN 13 10/15/2015   NA 142 10/15/2015   K 3.6 10/15/2015   CL 103 10/15/2015   No results found for: INR, PROTIME  10/2015 EKG: sinus bradycardia.  Anesthesia Physical Anesthesia Plan  ASA: II  Anesthesia Plan: General   Post-op Pain Management:    Induction: Intravenous  Airway Management Planned: LMA  Additional Equipment:   Intra-op Plan:   Post-operative Plan: Extubation in OR  Informed Consent: I have reviewed the patients History and Physical, chart, labs and discussed the procedure including the risks, benefits and alternatives for the proposed anesthesia with the patient or authorized representative who has indicated his/her understanding and acceptance.   Dental advisory  given  Plan Discussed with: CRNA  Anesthesia Plan Comments:         Anesthesia Quick Evaluation

## 2016-02-14 NOTE — H&P (View-Only) (Signed)
[name] is an 60 y.o. female.   Chief Complaint: bilateral ankle wounds HPI: The patient is a 60 yo female who is seen for bilateral chronic ankle wounds. She underwent the procedure 12/12/15 and the [facility] dressings were placed. She has undergone another debridement on 01/07/16 and comes in today for Acell placement to the left leg wound and VAC placement.  At today's visit, she reports she has been doing well with the dressing changes.   Past Medical History:  Diagnosis Date  . Ankle wound 02/2016   bilateral  . Dental bridge present    lower  . Dental crowns present    upper right  . Peripheral vascular disease (HCC)    venous stasis ulcers bilateral lower extremities  . PONV (postoperative nausea and vomiting)     Past Surgical History:  Procedure Laterality Date  . APPLICATION OF WOUND VAC Bilateral 12/12/2015   Procedure: APPLICATION OF WOUND VAC;  Surgeon: [name], DO;  Location: [place];  Service: Plastics;  Laterality: Bilateral;  . [place]  . I&D EXTREMITY Bilateral 12/12/2015   Procedure: DEBRIDEMENT OF BILATERAL LEG WOUNDS;  Surgeon: [name], DO;  Location: [place];  Service: Plastics;  Laterality: Bilateral;  . I&D EXTREMITY Bilateral 01/07/2016   Procedure: IRRIGATION AND DEBRIDEMENT OF BILATERAL LEGS;  Surgeon: [name], DO;  Location: [place];  Service: Plastics;  Laterality: Bilateral;  . PERIPHERAL VASCULAR CATHETERIZATION N/A 10/15/2015   Procedure: Abdominal Aortogram w/Lower Extremity;  Surgeon: Angelia Mould, MD;  Location: [place] [facility];  Service: Cardiovascular;  Laterality: N/A;  . TONSILLECTOMY     as a child  . VEIN LIGATION AND STRIPPING Left late 1990's    Family History  Problem Relation Age of Onset  . Diabetes Maternal Grandmother   . Diabetes Paternal Grandmother    Social History:  reports that she quit smoking about 8 years  ago. Her smoking use included Cigarettes. She has a 6.50 pack-year smoking history. She has never used smokeless tobacco. She reports that she drinks alcohol. She reports that she does not use drugs.  Allergies:  Allergies  Allergen Reactions  . Penicillins Hives and Itching     (Not in a hospital admission)  No results found for this or any previous visit (from the past 48 hour(s)). No results found.  Review of Systems  Constitutional: Negative.   HENT: Negative.   Eyes: Negative.   Respiratory: Negative.   Cardiovascular: Negative.   Gastrointestinal: Negative.   Genitourinary: Negative.   Musculoskeletal: Negative.   Skin: Negative.   Neurological: Negative.   Psychiatric/Behavioral: Negative.     There were no vitals taken for this visit. Physical Exam  Constitutional: She is oriented to person, place, and time. She appears well-developed and well-nourished.  HENT:  Head: Normocephalic and atraumatic.  Eyes: EOM are normal. Pupils are equal, round, and reactive to light.  Cardiovascular: Normal rate.   Respiratory: Effort normal.  GI: She exhibits no distension.  Neurological: She is alert and oriented to person, place, and time.  Skin: Skin is warm.  Psychiatric: She has a normal mood and affect. Her behavior is normal. Judgment and thought content normal.     Assessment/Plan Plan skin graft to bilateral ankle wound.  [name], DO 02/12/2016, 7:49 AM

## 2016-02-14 NOTE — Interval H&P Note (Signed)
History and Physical Interval Note:  02/14/2016 10:05 AM  [name]  has presented today for surgery, with the diagnosis of bilateral ankle wounds  The various methods of treatment have been discussed with the patient and family. After consideration of risks, benefits and other options for treatment, the patient has consented to  Procedure(s): SKIN GRAFT SPLIT THICKNESS TO BILATERAL ANKLES (Bilateral) as a surgical intervention .  The patient's history has been reviewed, patient examined, no change in status, stable for surgery.  I have reviewed the patient's chart and labs.  Questions were answered to the patient's satisfaction.     [name]

## 2016-02-14 NOTE — Op Note (Signed)
Operative Note   DATE OF OPERATION: 02/14/2016  LOCATION: [place]   SURGICAL DIVISION: Plastic Surgery  PREOPERATIVE DIAGNOSES:  Left foot wound 6 x 8 cm  POSTOPERATIVE DIAGNOSES:  same  PROCEDURE:  Split thickness skin graft 50 cm2to Left foot wound with VAC placement   SURGEON: Claire Sanger Dillingham, DO  ANESTHESIA:  General.   COMPLICATIONS: None.   INDICATIONS FOR PROCEDURE:  The patient, [name] is a 60 y.o. female born on 03-10-1956, is here for treatment of a chronic left foot wound. MRN: [id number]  CONSENT:  Informed consent was obtained directly from the patient. Risks, benefits and alternatives were fully discussed. Specific risks including but not limited to bleeding, infection, hematoma, seroma, scarring, pain, infection, contracture, asymmetry, wound healing problems, and need for further surgery were all discussed. The patient did have an ample opportunity to have questions answered to satisfaction. The decision was made not to graft the right foot as it was not ready and the home daily logistics would be very difficult for the patient.  DESCRIPTION OF PROCEDURE:  The patient was taken to the operating room. IV antibiotics were given. The patient's operative site was prepped and draped in a sterile fashion. A time out was performed and all information was confirmed to be correct.  General anesthesia was administered.  The area of the left foot was irrigated with antibiotic solution.  Debridement was done with the curette.  Hemostasis was achieved with pressure.  The skin graft was taken from the left thigh with the dermatome set at 05/999 inch.  The 50 cm2 graft was placed on the wound and secured with 5-0 Vicryl.  The donor site was covered with xeroform, gauze and an ABD.  The foot was covered with adaptic and the VAC.  There was an excellent seal.  The patient tolerated the procedure well.  There were no complications. The patient was  allowed to wake from anesthesia, extubated and taken to the recovery room in satisfactory condition.

## 2016-02-14 NOTE — Anesthesia Procedure Notes (Signed)
Procedure Name: LMA Insertion Date/Time: 02/14/2016 10:47 AM Performed by: Melynda Ripple D Pre-anesthesia Checklist: Patient identified, Emergency Drugs available, Suction available and Patient being monitored Patient Re-evaluated:Patient Re-evaluated prior to inductionOxygen Delivery Method: Circle system utilized Preoxygenation: Pre-oxygenation with 100% oxygen Intubation Type: IV induction Ventilation: Mask ventilation without difficulty LMA: LMA inserted LMA Size: 3.0 Number of attempts: 1 Airway Equipment and Method: Bite block Placement Confirmation: positive ETCO2 Tube secured with: Tape Dental Injury: Teeth and Oropharynx as per pre-operative assessment

## 2016-02-14 NOTE — Transfer of Care (Signed)
Immediate Anesthesia Transfer of Care Note  Patient: [name]  Procedure(s) Performed: Procedure(s): SKIN GRAFT SPLIT THICKNESS TO LEFT ANKLE (Left) DRESSING CHANGE UNDER ANESTHESIA (Right)  Patient Location: PACU  Anesthesia Type:General  Level of Consciousness: awake and patient cooperative  Airway & Oxygen Therapy: Patient Spontanous Breathing and Patient connected to face mask oxygen  Post-op Assessment: Report given to RN and Post -op Vital signs reviewed and stable  Post vital signs: Reviewed and stable  Last Vitals: There were no vitals filed for this visit.  Last Pain:  Vitals:   02/14/16 0924  PainSc: 8       Patients Stated Pain Goal: 2 (79/02/40 9735)  Complications: No apparent anesthesia complications

## 2016-02-14 NOTE — Discharge Instructions (Signed)
Do not disconnect VAC on left foot Do not remove splint from left foot Dressing change to right foot daily Touchdown weight bearing for transfer only. Do not walk on Lt foot.  Call your surgeon if you experience:   1.  Fever over 101.0. 2.  Inability to urinate. 3.  Nausea and/or vomiting. 4.  Extreme swelling or bruising at the surgical site. 5.  Continued bleeding from the incision. 6.  Increased pain, redness or drainage from the incision. 7.  Problems related to your pain medication. 8.  Any problems and/or concerns  Post Anesthesia Home Care Instructions  Activity: Get plenty of rest for the remainder of the day. A responsible adult should stay with you for 24 hours following the procedure.  For the next 24 hours, DO NOT: -Drive a car -Paediatric nurse -Drink alcoholic beverages -Take any medication unless instructed by your physician -Make any legal decisions or sign important papers.  Meals: Start with liquid foods such as gelatin or soup. Progress to regular foods as tolerated. Avoid greasy, spicy, heavy foods. If nausea and/or vomiting occur, drink only clear liquids until the nausea and/or vomiting subsides. Call your physician if vomiting continues.  Special Instructions/Symptoms: Your throat may feel dry or sore from the anesthesia or the breathing tube placed in your throat during surgery. If this causes discomfort, gargle with warm salt water. The discomfort should disappear within 24 hours.  If you had a scopolamine patch placed behind your ear for the management of post- operative nausea and/or vomiting:  1. The medication in the patch is effective for 72 hours, after which it should be removed.  Wrap patch in a tissue and discard in the trash. Wash hands thoroughly with soap and water. 2. You may remove the patch earlier than 72 hours if you experience unpleasant side effects which may include dry mouth, dizziness or visual disturbances. 3. Avoid touching the  patch. Wash your hands with soap and water after contact with the patch.

## 2016-02-14 NOTE — Brief Op Note (Signed)
02/14/2016  11:36 AM  PATIENT:  [name]  60 y.o. female  PRE-OPERATIVE DIAGNOSIS:  bilateral ankle wounds  POST-OPERATIVE DIAGNOSIS:  left ankle wound  PROCEDURE:  Procedure(s): SKIN GRAFT SPLIT THICKNESS TO LEFT ANKLE (Left)  SURGEON:  Surgeon(s) and Role:    * Claire S Dillingham, DO - Primary  PHYSICIAN ASSISTANT: none  ASSISTANTS: none   ANESTHESIA:   general  EBL:  Total I/O In: -  Out: 10 [Blood:10]  BLOOD ADMINISTERED:none  DRAINS: none   LOCAL MEDICATIONS USED:  NONE  SPECIMEN:  No Specimen  DISPOSITION OF SPECIMEN:  N/A  COUNTS:  YES  TOURNIQUET:  * No tourniquets in log *  DICTATION: .Dragon Dictation  PLAN OF CARE: Discharge to home after PACU  PATIENT DISPOSITION:  PACU - hemodynamically stable.   Delay start of Pharmacological VTE agent (>24hrs) due to surgical blood loss or risk of bleeding: no

## 2016-02-15 NOTE — Addendum Note (Signed)
Addendum  created 02/15/16 1248 by Marrianne Mood, CRNA   Charge Capture section accepted

## 2016-02-17 ENCOUNTER — Encounter (HOSPITAL_BASED_OUTPATIENT_CLINIC_OR_DEPARTMENT_OTHER): Payer: Self-pay | Admitting: Plastic Surgery

## 2016-02-28 ENCOUNTER — Encounter (HOSPITAL_BASED_OUTPATIENT_CLINIC_OR_DEPARTMENT_OTHER): Payer: Commercial Managed Care - HMO | Attending: Internal Medicine

## 2016-02-28 DIAGNOSIS — L97312 Non-pressure chronic ulcer of right ankle with fat layer exposed: Secondary | ICD-10-CM | POA: Diagnosis not present

## 2016-02-28 DIAGNOSIS — I87331 Chronic venous hypertension (idiopathic) with ulcer and inflammation of right lower extremity: Secondary | ICD-10-CM | POA: Insufficient documentation

## 2016-03-06 ENCOUNTER — Encounter (HOSPITAL_BASED_OUTPATIENT_CLINIC_OR_DEPARTMENT_OTHER): Payer: Commercial Managed Care - HMO | Attending: Internal Medicine

## 2016-03-06 DIAGNOSIS — I83212 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 and inflammation: Secondary | ICD-10-CM | POA: Insufficient documentation

## 2016-03-06 DIAGNOSIS — L97321 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 breakdown of skin: Secondary | ICD-10-CM | POA: Diagnosis not present

## 2016-03-06 DIAGNOSIS — I83223 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 and inflammation: Secondary | ICD-10-CM | POA: Insufficient documentation

## 2016-03-06 DIAGNOSIS — L97319 Non-pressure chronic ulcer of right ankle with unspecified severity: Secondary | ICD-10-CM | POA: Diagnosis not present

## 2016-03-13 DIAGNOSIS — I83212 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 and inflammation: Secondary | ICD-10-CM | POA: Diagnosis not present

## 2016-03-17 DIAGNOSIS — Z945 Skin transplant status: Secondary | ICD-10-CM | POA: Insufficient documentation

## 2016-03-20 DIAGNOSIS — I83212 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 and inflammation: Secondary | ICD-10-CM | POA: Diagnosis not present

## 2016-03-27 DIAGNOSIS — I83212 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 and inflammation: Secondary | ICD-10-CM | POA: Diagnosis not present

## 2016-04-03 ENCOUNTER — Encounter (HOSPITAL_BASED_OUTPATIENT_CLINIC_OR_DEPARTMENT_OTHER): Payer: Commercial Managed Care - HMO | Attending: Internal Medicine

## 2016-04-03 DIAGNOSIS — I83212 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 and inflammation: Secondary | ICD-10-CM | POA: Insufficient documentation

## 2016-04-03 DIAGNOSIS — L03116 Cellulitis of left lower limb: Secondary | ICD-10-CM | POA: Insufficient documentation

## 2016-04-03 DIAGNOSIS — I83223 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 and inflammation: Secondary | ICD-10-CM | POA: Diagnosis not present

## 2016-04-03 DIAGNOSIS — L97311 Non-pressure chronic ulcer of right ankle limited to breakdown of skin: Secondary | ICD-10-CM | POA: Insufficient documentation

## 2016-04-03 DIAGNOSIS — L97321 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 breakdown of skin: Secondary | ICD-10-CM | POA: Diagnosis not present

## 2016-04-10 DIAGNOSIS — I83223 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 and inflammation: Secondary | ICD-10-CM | POA: Diagnosis not present

## 2016-05-01 DIAGNOSIS — I83223 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 and inflammation: Secondary | ICD-10-CM | POA: Diagnosis not present

## 2016-05-15 ENCOUNTER — Encounter (HOSPITAL_BASED_OUTPATIENT_CLINIC_OR_DEPARTMENT_OTHER): Payer: Commercial Managed Care - HMO | Attending: Internal Medicine

## 2016-05-15 DIAGNOSIS — L97821 Non-pressure chronic ulcer of other part of left lower leg limited to breakdown of skin: Secondary | ICD-10-CM | POA: Diagnosis not present

## 2016-05-15 DIAGNOSIS — L97319 Non-pressure chronic ulcer of right ankle with unspecified severity: Secondary | ICD-10-CM | POA: Diagnosis not present

## 2016-05-15 DIAGNOSIS — I83212 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 and inflammation: Secondary | ICD-10-CM | POA: Insufficient documentation

## 2016-05-15 DIAGNOSIS — L97329 Non-pressure chronic ulcer of left ankle with unspecified severity: Secondary | ICD-10-CM | POA: Diagnosis not present

## 2016-05-15 DIAGNOSIS — I83223 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 and inflammation: Secondary | ICD-10-CM | POA: Insufficient documentation

## 2016-06-05 ENCOUNTER — Encounter (HOSPITAL_BASED_OUTPATIENT_CLINIC_OR_DEPARTMENT_OTHER): Payer: Commercial Managed Care - HMO | Attending: Internal Medicine

## 2016-06-05 ENCOUNTER — Encounter (HOSPITAL_BASED_OUTPATIENT_CLINIC_OR_DEPARTMENT_OTHER): Payer: Self-pay

## 2016-06-05 DIAGNOSIS — L97821 Non-pressure chronic ulcer of other part of left lower leg limited to breakdown of skin: Secondary | ICD-10-CM | POA: Insufficient documentation

## 2016-06-05 DIAGNOSIS — L97329 Non-pressure chronic ulcer of left ankle with unspecified severity: Secondary | ICD-10-CM | POA: Insufficient documentation

## 2016-06-05 DIAGNOSIS — I83223 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 and inflammation: Secondary | ICD-10-CM | POA: Insufficient documentation

## 2016-06-05 DIAGNOSIS — L97311 Non-pressure chronic ulcer of right ankle limited to breakdown of skin: Secondary | ICD-10-CM | POA: Insufficient documentation

## 2016-06-09 DIAGNOSIS — L97311 Non-pressure chronic ulcer of right ankle limited to breakdown of skin: Secondary | ICD-10-CM | POA: Diagnosis not present

## 2016-06-09 DIAGNOSIS — L97329 Non-pressure chronic ulcer of left ankle with unspecified severity: Secondary | ICD-10-CM | POA: Diagnosis not present

## 2016-06-09 DIAGNOSIS — L97821 Non-pressure chronic ulcer of other part of left lower leg limited to breakdown of skin: Secondary | ICD-10-CM | POA: Diagnosis not present

## 2016-06-09 DIAGNOSIS — I83223 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 and inflammation: Secondary | ICD-10-CM | POA: Diagnosis not present

## 2016-06-30 DIAGNOSIS — I83223 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 and inflammation: Secondary | ICD-10-CM | POA: Diagnosis not present

## 2016-07-28 ENCOUNTER — Encounter (HOSPITAL_BASED_OUTPATIENT_CLINIC_OR_DEPARTMENT_OTHER): Payer: Commercial Managed Care - HMO | Attending: Internal Medicine

## 2016-07-28 DIAGNOSIS — I83213 Varicose veins of right lower extremity with both ulcer of ankle and inflammation: Secondary | ICD-10-CM | POA: Insufficient documentation

## 2016-07-28 DIAGNOSIS — I83223 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 and inflammation: Secondary | ICD-10-CM | POA: Diagnosis present

## 2016-07-28 DIAGNOSIS — L97322 Non-pressure chronic ulcer of left ankle with fat layer exposed: Secondary | ICD-10-CM | POA: Insufficient documentation

## 2016-07-28 DIAGNOSIS — L97312 Non-pressure chronic ulcer of right ankle with fat layer exposed: Secondary | ICD-10-CM | POA: Diagnosis not present

## 2016-08-25 ENCOUNTER — Encounter (HOSPITAL_BASED_OUTPATIENT_CLINIC_OR_DEPARTMENT_OTHER): Payer: Commercial Managed Care - HMO | Attending: Internal Medicine

## 2016-08-25 DIAGNOSIS — I83223 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 and inflammation: Secondary | ICD-10-CM | POA: Diagnosis not present

## 2016-08-25 DIAGNOSIS — L97312 Non-pressure chronic ulcer of right ankle with fat layer exposed: Secondary | ICD-10-CM | POA: Insufficient documentation

## 2016-08-25 DIAGNOSIS — L97322 Non-pressure chronic ulcer of left ankle with fat layer exposed: Secondary | ICD-10-CM | POA: Diagnosis not present

## 2016-08-25 DIAGNOSIS — I83212 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 and inflammation: Secondary | ICD-10-CM | POA: Diagnosis present

## 2016-09-05 ENCOUNTER — Encounter: Payer: Self-pay | Admitting: Neurology

## 2016-09-22 ENCOUNTER — Encounter (HOSPITAL_BASED_OUTPATIENT_CLINIC_OR_DEPARTMENT_OTHER): Payer: Commercial Managed Care - HMO | Attending: Internal Medicine

## 2016-09-22 DIAGNOSIS — I83223 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 and inflammation: Secondary | ICD-10-CM | POA: Insufficient documentation

## 2016-09-22 DIAGNOSIS — I83212 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 and inflammation: Secondary | ICD-10-CM | POA: Insufficient documentation

## 2016-09-22 DIAGNOSIS — L97321 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 breakdown of skin: Secondary | ICD-10-CM | POA: Insufficient documentation

## 2016-09-22 DIAGNOSIS — L97311 Non-pressure chronic ulcer of right ankle limited to breakdown of skin: Secondary | ICD-10-CM | POA: Insufficient documentation

## 2016-09-22 DIAGNOSIS — L03116 Cellulitis of left lower limb: Secondary | ICD-10-CM | POA: Insufficient documentation

## 2016-10-13 ENCOUNTER — Encounter (HOSPITAL_BASED_OUTPATIENT_CLINIC_OR_DEPARTMENT_OTHER): Payer: Commercial Managed Care - HMO | Attending: Internal Medicine

## 2016-10-13 DIAGNOSIS — L97322 Non-pressure chronic ulcer of left ankle with fat layer exposed: Secondary | ICD-10-CM | POA: Diagnosis not present

## 2016-10-13 DIAGNOSIS — I83213 Varicose veins of right lower extremity with both ulcer of ankle and inflammation: Secondary | ICD-10-CM | POA: Diagnosis not present

## 2016-10-13 DIAGNOSIS — L97312 Non-pressure chronic ulcer of right ankle with fat layer exposed: Secondary | ICD-10-CM | POA: Diagnosis not present

## 2016-10-13 DIAGNOSIS — I83223 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 and inflammation: Secondary | ICD-10-CM | POA: Insufficient documentation

## 2016-10-22 ENCOUNTER — Encounter: Payer: Self-pay | Admitting: Neurology

## 2016-10-30 ENCOUNTER — Ambulatory Visit (INDEPENDENT_AMBULATORY_CARE_PROVIDER_SITE_OTHER): Payer: Commercial Managed Care - HMO | Admitting: Neurology

## 2016-10-30 ENCOUNTER — Encounter: Payer: Self-pay | Admitting: Neurology

## 2016-10-30 VITALS — BP 100/60 | HR 62 | Ht 68.0 in | Wt 127.1 lb

## 2016-10-30 DIAGNOSIS — R292 Abnormal reflex: Secondary | ICD-10-CM | POA: Diagnosis not present

## 2016-10-30 DIAGNOSIS — Q667 Congenital pes cavus, unspecified foot: Secondary | ICD-10-CM

## 2016-10-30 DIAGNOSIS — R202 Paresthesia of skin: Secondary | ICD-10-CM

## 2016-10-30 NOTE — Patient Instructions (Signed)
1.  NCS/EMG of the legs.  Please do not apply lotion to your legs on the day of your testing 2.  Check labs  We will call you with the results which will guide the next step

## 2016-10-30 NOTE — Progress Notes (Signed)
[facility] Clinic Note - Initial Visit   Date: 10/30/16  [name] MRN: [id number] DOB: Jan 28, 1956   Dear Dr. Doran Durand:  Thank you for your kind referral of [name] for consultation of gait instability and feet deformity. Although her history is well known to you, please allow [place] to reiterate it for the purpose of our medical record. The patient was accompanied to the clinic by self.    History of Present Illness: [name] is a 61 y.o. 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    For the past 10 years, she had had chronic nonhealing ulcers of the medial distal lower leg which more recently has been taking much longer to heal.  Usually, her ulcers heal within 2-3 months and other times it takes 6-7 months, but this time she has been dealing with them for the past year.  She has associated severe pain and had surgical debridement in February with skin graft on the left.  In 2017, she has vascular studies of the legs did not show any arterial occlusion in the the leg and suggested she has venostasis ulcers.    Over the past year, she began having difficulty walking and standing on her feet, because they tend to be arched and foot started to turn inwards.  She also noticed new shooting pain over the lateral foot, which is not the same location as the wound.  She has noticed tightness of the posterior leg and has inability to dorsiflex and evert her foot on the left, the right is similar but she does have some range of motion preserved.  She sometimes uses a walker for ambulation, but mostly walks independently.  She has some numbness of the feet and attributes that to having her legs wrapped always.  She saw Dr. Doran Durand for these symptoms who recommended that she start wearing a AFO and consider heel cord lengthening, however, with her already poor wound  healing, she is not keen on any surgical procedures.   She is also the primary caregiver of her husband who suffered a stroke in January.   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    She has no history of diabetes, alcoholism, or family history of neuropathy/Charcot-Marie-Tooth disease.   Past Medical History:  Diagnosis Date  . Ankle wound 02/2016   bilateral  . Dental bridge present    lower  . Dental crowns present    upper right  . Peripheral vascular disease (HCC)    venous stasis ulcers bilateral lower extremities  . PONV (postoperative nausea and vomiting)     Past Surgical History:  Procedure Laterality Date  . APPLICATION OF WOUND VAC Bilateral 12/12/2015   Procedure: APPLICATION OF WOUND VAC;  Surgeon: [name], DO;  Location: [place];  Service: Plastics;  Laterality: Bilateral;  . DRESSING CHANGE UNDER ANESTHESIA Right 02/14/2016   Procedure: DRESSING CHANGE UNDER ANESTHESIA;  Surgeon: [name], DO;  Location: [place];  Service: Plastics;  Laterality: Right;  . [place]  . I&D EXTREMITY Bilateral 12/12/2015   Procedure: DEBRIDEMENT OF BILATERAL LEG WOUNDS;  Surgeon: [name], DO;  Location: [place];  Service: Plastics;  Laterality: Bilateral;  . I&D EXTREMITY Bilateral 01/07/2016   Procedure: IRRIGATION AND DEBRIDEMENT OF BILATERAL LEGS;  Surgeon: [name], DO;  Location: [place];  Service: Clinical cytogeneticist;  Laterality: Bilateral;  . PERIPHERAL VASCULAR CATHETERIZATION N/A 10/15/2015   Procedure: Abdominal Aortogram w/Lower Extremity;  Surgeon: Angelia Mould, MD;  Location: [facility];  Service: Cardiovascular;  Laterality: N/A;  . TONSILLECTOMY     as a child  . VEIN LIGATION AND STRIPPING Left late 1990's  . WOUND DEBRIDEMENT Left 02/14/2016   Procedure: SKIN GRAFT SPLIT THICKNESS TO LEFT ANKLE;  Surgeon:  [name], DO;  Location: [place];  Service: Plastics;  Laterality: Left;     Medications:  Outpatient Encounter Prescriptions as of 10/30/2016  Medication Sig  . HYDROcodone-acetaminophen (NORCO) 5-325 MG tablet Take 1 tablet by mouth every 6 (six) hours as needed for moderate pain.  . Multiple Vitamin (MULTIVITAMIN) capsule Take 1 capsule by mouth daily.  . vitamin C (ASCORBIC ACID) 500 MG tablet Take 500 mg by mouth daily.  . [DISCONTINUED] HYDROcodone-acetaminophen (NORCO/VICODIN) 5-325 MG tablet Take 1 tablet by mouth every 6 (six) hours as needed for moderate pain.  . [DISCONTINUED] Multiple Vitamins-Minerals (ZINC PO) Take by mouth.   No facility-administered encounter medications on file as of 10/30/2016.      Allergies:  Allergies  Allergen Reactions  . Penicillins Hives and Itching    Family History: Family History  Problem Relation Age of Onset  . Hypertension Mother   . Lung cancer Father   . Diabetes Maternal Grandmother   . Diabetes Paternal Grandmother   . Healthy Brother     Social History: Social History  Substance Use Topics  . Smoking status: Former Smoker    Packs/day: 0.25    Years: 26.00    Types: Cigarettes    Quit date: 04/25/2007  . Smokeless tobacco: Never Used  . Alcohol use 0.0 oz/week     Comment: occasionally   Social History   Social History Narrative   Patient lives with husband in a one story home. Has no children.  Took early retirement due to medical condition.  Formerly worked as a Glass blower/designer.  Education: 12th.      Review of Systems:  CONSTITUTIONAL: No fevers, chills, night sweats, or weight loss.   EYES: No visual changes or eye pain ENT: No hearing changes.  No history of nose bleeds.   RESPIRATORY: No cough, wheezing and shortness of breath.   CARDIOVASCULAR: Negative for chest pain, and palpitations.   GI: Negative for abdominal discomfort, blood in stools or black stools.  No recent  change in bowel habits.   GU:  No history of incontinence.   MUSCLOSKELETAL: No history of joint pain or swelling.  No myalgias.   SKIN: + lesions, rash, and itching.   HEMATOLOGY/ONCOLOGY: Negative for prolonged bleeding, bruising easily, and swollen nodes.  No history of cancer.   ENDOCRINE: Negative for cold or heat intolerance, polydipsia or goiter.   PSYCH:  No depression or anxiety symptoms.   NEURO: As Above.   Vital Signs:  BP 100/60   Pulse 62   Ht 5\' 8"  (1.727 m)   Wt 127 lb 2 oz (57.7 kg)   SpO2 98%   BMI 19.33 kg/m    General Medical Exam:   General:  Well appearing, comfortable.   Eyes/ENT: see cranial nerve examination.   Neck: No masses appreciated.  Full range of motion without tenderness.  No carotid bruits. Respiratory:  Clear to auscultation, good air entry bilaterally.   Cardiac:  Regular rate and rhythm, no murmur.   Extremities:  Bilateral lower legs  are wrapped in ace wrapping, when unwrapped there is marked loss of muscle bulk in anterior and posterior compartments of the lower legs.  She has pes cavus bilaterally and bunion on the right great toe.  Her wounds are covered in dressing.    Neurological Exam: MENTAL STATUS including orientation to time, place, person, recent and remote memory, attention span and concentration, language, and fund of knowledge is normal.  Speech is not dysarthric.  CRANIAL NERVES: II:  No visual field defects.  Unremarkable fundi.   III-IV-VI: Pupils equal round and reactive to light.  Normal conjugate, extra-ocular eye movements in all directions of gaze.  No nystagmus.  No ptosis.   V:  Normal facial sensation.     VII:  Normal facial symmetry and movements.   VIII:  Normal hearing and vestibular function.   IX-X:  Normal palatal movement.   XI:  Normal shoulder shrug and head rotation.   XII:  Normal tongue strength and range of motion, no deviation or fasciculation.  MOTOR:  Marked bilateral TA and medial gastrocnemius  loss of muscle bulk.  Motor strength is 5/5 throughout except 5-/5 left dorsiflexion and toe extension which is severely limited by tight heel cords, limiting any dorsiflexion.  There is greater range of motion on the right with foot dorsiflexion/eversion/plantar flexion which is 5/5.  No fasciculations or abnormal movements.  No pronator drift.  Tone is increased in the legs.    MSRs:  Right                                                                 Left brachioradialis 2+  brachioradialis 2+  biceps 2+  biceps 2+  triceps 2+  triceps 2+  patellar 3+  patellar 3+  ankle jerk 2+  ankle jerk 2+  Hoffman no  Hoffman no  plantar response up  plantar response up   SENSORY:  Vibration is intact throughout.  Temperature and pin prick is mildly reduced over the mid-calf and feet.  Sensation is intact above the knees and in the arms.  COORDINATION/GAIT: Normal finger-to- nose-finger.  Intact rapid alternating movements bilaterally.  Unable to rise from a chair without using arms.  She is unable to stand flat on her feet and L > R heel remains raised because of tight heel cords.  She is able to ambulate unassisted but gait is wide-based and unsteady.     IMPRESSION: Mrs. Mceachern is a delightful 61 year-old female referred for evaluation of progressive gait instability due to bilateral heel cord tightening, pes cavus, and chronic nonhealing ulcerations of the lower extremities.  Certainly, features on her exam are concerning for hereditary neuropathy, however, the fact that her reflexes are increased in the lower extremities and she has extensor plantar response is not seen in neuropathy, where reflexes and tone are normally diminished.  This is a complex presentation and exam. It is possible that her heel cord tightening is due to progressive CNS pathology.   I will first order electrodiagnostic testing of the legs to look for demyelinating slowing or axonal neuropathy.  Going forward, she will also need  CNS imaging to look for spinal cord abnormalities, given her upper motor neuron findings.  I discussed at length the possibilties and reason for  neurological testing as well as management and prognosis of each.  At this time, it is too early to determine the cause of her symptoms and management is symptomatic which she has been doing.  PLAN/RECOMMENDATIONS:  1.  NCS/EMG of the legs 2.  Check TSH, vitamin B12, vitamin B1, copper, folate, SPEP with IFE 3.  Consider MRI thoracic and lumbar spine going forward to evaluate upper motor neuron findings  Further recommendations will be based on the results of her testing   The duration of this appointment visit was 60 minutes of face-to-face time with the patient.  Greater than 50% of this time was spent in counseling, explanation of diagnosis, planning of further management, and coordination of care.   Thank you for allowing me to participate in patient's care.  If I can answer any additional questions, I would be pleased to do so.    Sincerely,    Kain Milosevic K. Posey Pronto, DO

## 2016-11-03 ENCOUNTER — Encounter (HOSPITAL_BASED_OUTPATIENT_CLINIC_OR_DEPARTMENT_OTHER): Payer: Commercial Managed Care - HMO | Attending: Internal Medicine

## 2016-11-03 DIAGNOSIS — L97322 Non-pressure chronic ulcer of left ankle with fat layer exposed: Secondary | ICD-10-CM | POA: Diagnosis not present

## 2016-11-03 DIAGNOSIS — L97312 Non-pressure chronic ulcer of right ankle with fat layer exposed: Secondary | ICD-10-CM | POA: Diagnosis not present

## 2016-11-03 DIAGNOSIS — L03116 Cellulitis of left lower limb: Secondary | ICD-10-CM | POA: Diagnosis not present

## 2016-11-03 DIAGNOSIS — I83223 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 and inflammation: Secondary | ICD-10-CM | POA: Diagnosis not present

## 2016-11-03 DIAGNOSIS — I83213 Varicose veins of right lower extremity with both ulcer of ankle and inflammation: Secondary | ICD-10-CM | POA: Diagnosis not present

## 2016-11-10 ENCOUNTER — Ambulatory Visit: Payer: Commercial Managed Care - HMO | Admitting: Neurology

## 2016-11-11 ENCOUNTER — Other Ambulatory Visit (INDEPENDENT_AMBULATORY_CARE_PROVIDER_SITE_OTHER): Payer: Commercial Managed Care - HMO

## 2016-11-11 DIAGNOSIS — R202 Paresthesia of skin: Secondary | ICD-10-CM | POA: Diagnosis not present

## 2016-11-11 DIAGNOSIS — Q667 Congenital pes cavus, unspecified foot: Secondary | ICD-10-CM

## 2016-11-11 LAB — VITAMIN B12: Vitamin B-12: 395 pg/mL (ref 211–911)

## 2016-11-11 LAB — FOLATE: Folate: 17.6 ng/mL (ref >5.9)

## 2016-11-11 LAB — TSH: TSH: 1.55 u[IU]/mL (ref 0.35–4.50)

## 2016-11-13 ENCOUNTER — Telehealth: Payer: Self-pay | Admitting: Neurology

## 2016-11-13 ENCOUNTER — Ambulatory Visit (INDEPENDENT_AMBULATORY_CARE_PROVIDER_SITE_OTHER): Payer: Commercial Managed Care - HMO | Admitting: Neurology

## 2016-11-13 DIAGNOSIS — R202 Paresthesia of skin: Secondary | ICD-10-CM | POA: Diagnosis not present

## 2016-11-13 DIAGNOSIS — Q667 Congenital pes cavus, unspecified foot: Secondary | ICD-10-CM

## 2016-11-13 DIAGNOSIS — G629 Polyneuropathy, unspecified: Secondary | ICD-10-CM

## 2016-11-13 LAB — PROTEIN ELECTROPHORESIS, SERUM
ALBUMIN ELP: 3.7 g/dL — AB (ref 3.8–4.8)
ALPHA-1-GLOBULIN: 0.3 g/dL (ref 0.2–0.3)
Alpha-2-Globulin: 0.6 g/dL (ref 0.5–0.9)
BETA 2: 0.3 g/dL (ref 0.2–0.5)
BETA GLOBULIN: 0.4 g/dL (ref 0.4–0.6)
Gamma Globulin: 1 g/dL (ref 0.8–1.7)
Total Protein, Serum Electrophoresis: 6.3 g/dL (ref 6.1–8.1)

## 2016-11-13 LAB — IMMUNOFIXATION ELECTROPHORESIS
IgA: 180 mg/dL (ref 81–463)
IgG (Immunoglobin G), Serum: 946 mg/dL (ref 694–1618)
IgM, Serum: 234 mg/dL (ref 48–271)

## 2016-11-13 LAB — COPPER, SERUM: Copper: 111 ug/dL (ref 70–175)

## 2016-11-13 NOTE — Procedures (Signed)
[facility]  [address]  [place], [place] [address] Tel: [phone] Fax:  [phone] Test Date:  11/13/2016  Patient: [name] DOB: 1955/07/07 Physician: Narda Amber, DO  Sex: Female Height: 5\' 8"  Ref Phys: Narda Amber, DO  ID#: [id number] Temp: 35.1C Technician:    Patient Complaints: This is a 61 year-old female with history of nonhealing ulcers and pes cavus referred for evaluation of neuropathy.  NCV & EMG Findings: Extensive electrodiagnostic testing of the right upper and lower extremities and additional studies of the left lower extremity shows:  1. Bilateral sural sensory responses are absent. Bilateral superficial peroneal sensory responses are within normal limits. 2. Right median and ulnar sensory responses are within normal limits. 3. Bilateral peroneal motor responses are essentially absent at the extensor digitorum brevis, and normal at the tibialis anterior.  Tibial motor responses were unable to be tested due to close proximity of patient's nonhealing ulcers. 4. Bilateral tibial H reflex studies are absent. 5. Sparse chronic motor axon loss changes are seen affecting bilateral tibialis anterior muscles, without accompanied active denervation.  Impression: The electrophysiologic findings are consistent with a chronic sensorimotor polyneuropathy, axon loss in type, affecting the lower extremities. Overall, these findings are mild in degree electrically.  There is no evidence of a sensorimotor polyneuropathy involving the right upper extremity.    ___________________________ Narda Amber, DO    Nerve Conduction Studies Anti Sensory Summary Table   Site NR Peak (ms) Norm Peak (ms) P-T Amp (V) Norm P-T Amp  Right Median Anti Sensory (2nd Digit)  35.1C  Wrist    3.0 <3.6 24.0 >15  Left Sup Peroneal Anti Sensory (Ant Lat Mall)  35.1C  12 cm    2.5 <4.6 4.3 >4  Right Sup Peroneal Anti Sensory (Ant Lat Mall)  35.1C  12 cm    3.3  <4.6 4.9 >4  Left Sural Anti Sensory (Lat Mall)  35.1C  Calf NR  <4.6  >4  Right Sural Anti Sensory (Lat Mall)  35.1C  Calf NR  <4.6  >4  Right Ulnar Anti Sensory (5th Digit)  35.1C  Wrist    2.8 <3.1 24.5 >10   Motor Summary Table   Site NR Onset (ms) Norm Onset (ms) O-P Amp (mV) Norm O-P Amp Site1 Site2 Delta-0 (ms) Dist (cm) Vel (m/s) Norm Vel (m/s)  Right Median Motor (Abd Poll Brev)  35.1C  Wrist    3.0 <4.0 8.1 >6 Elbow Wrist 5.4 29.0 54 >50  Elbow    8.4  7.8         Left Peroneal Motor (Ext Dig Brev)  35.1C  Ankle    3.8 <6.0 0.8 >2.5 B Fib Ankle 8.3 33.0 40 >40  B Fib    12.1  0.5  Poplt B Fib 1.9 8.0 42 >40  Poplt    14.0  0.5         Right Peroneal Motor (Ext Dig Brev)  35.1C  Ankle NR  <6.0  >2.5 B Fib Ankle  0.0  >40  B Fib NR     Poplt B Fib  0.0  >40  Poplt NR            Left Peroneal TA Motor (Tib Ant)  35.1C  Fib Head    4.0 <4.5 3.9 >3 Poplit Fib Head 1.5 8.0 53 >40  Poplit    5.5  3.8         Right Peroneal TA Motor (Tib Ant)  35.1C  Fib Head    4.3 <4.5 4.0 >3 Poplit Fib Head 1.6 8.0 50 >40  Poplit    5.9  3.9         Right Ulnar Motor (Abd Dig Minimi)  35.1C  Wrist    2.4 <3.1 8.2 >7 B Elbow Wrist 3.5 24.0 69 >50  B Elbow    5.9  8.0  A Elbow B Elbow 1.8 10.0 56 >50  A Elbow    7.7  7.5          H Reflex Studies   NR H-Lat (ms) Lat Norm (ms) L-R H-Lat (ms)  Left Tibial (Gastroc)  35.1C  NR  <35   Right Tibial (Gastroc)  35.1C  NR  <35    EMG   Side Muscle Ins Act Fibs Psw Fasc Number Recrt Dur Dur. Amp Amp. Poly Poly. Comment  Right AntTibialis Nml Nml Nml Nml 1- Rapid Few 1+ Few 1+ Nml Nml N/A  Right Gastroc Nml Nml Nml Nml Nml Nml Nml Nml Nml Nml Nml Nml N/A  Right RectFemoris Nml Nml Nml Nml Nml Nml Nml Nml Nml Nml Nml Nml N/A  Right GluteusMed Nml Nml Nml Nml Nml Nml Nml Nml Nml Nml Nml Nml N/A  Right BicepsFemS Nml Nml Nml Nml Nml Nml Nml Nml Nml Nml Nml Nml N/A  Left AntTibialis Nml Nml Nml Nml 1- Rapid Few 1+ Few 1+ Nml Nml N/A    Left RectFemoris Nml Nml Nml Nml Nml Nml Nml Nml Nml Nml Nml Nml N/A  Left Gastroc Nml Nml Nml Nml Nml Nml Nml Nml Nml Nml Nml Nml N/A      Waveforms:

## 2016-11-13 NOTE — Telephone Encounter (Signed)
Results of EMG discussed with patient which shows mild sensorimotor polyneuropathy affecting the legs, which would not explain the severity of her ulcers her hyperreflexia. To further evaluate her upper motor neuron findings, she will have MRI lumbar and thoracic spine without contrast.  Going forward she may also need imaging of the cervical spine as well as brain.  Donika K. Posey Pronto, DO

## 2016-11-14 ENCOUNTER — Telehealth: Payer: Self-pay | Admitting: *Deleted

## 2016-11-14 LAB — VITAMIN B1: Vitamin B1 (Thiamine): 14 nmol/L (ref 8–30)

## 2016-11-14 NOTE — Telephone Encounter (Signed)
-----   Message from Alda Berthold, DO sent at 11/14/2016  3:49 PM EDT ----- Please notify patient lab are within normal limits.  Thank you.

## 2016-11-14 NOTE — Telephone Encounter (Signed)
Patient notified

## 2016-11-24 DIAGNOSIS — I83223 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 and inflammation: Secondary | ICD-10-CM | POA: Diagnosis not present

## 2016-11-27 ENCOUNTER — Telehealth: Payer: Self-pay | Admitting: Neurology

## 2016-11-27 NOTE — Telephone Encounter (Signed)
Patient states that she wants the test results of the test that she had done last week

## 2016-11-27 NOTE — Telephone Encounter (Signed)
I spoke with patient and she said that she has the MRIs set up for July 5.  Informed her that we would be in touch with her when we get the results in.

## 2016-12-15 ENCOUNTER — Encounter (HOSPITAL_BASED_OUTPATIENT_CLINIC_OR_DEPARTMENT_OTHER): Payer: Commercial Managed Care - HMO | Attending: Internal Medicine

## 2016-12-15 DIAGNOSIS — L03116 Cellulitis of left lower limb: Secondary | ICD-10-CM | POA: Diagnosis not present

## 2016-12-15 DIAGNOSIS — L97312 Non-pressure chronic ulcer of right ankle with fat layer exposed: Secondary | ICD-10-CM | POA: Diagnosis not present

## 2016-12-15 DIAGNOSIS — L97322 Non-pressure chronic ulcer of left ankle with fat layer exposed: Secondary | ICD-10-CM | POA: Diagnosis not present

## 2016-12-15 DIAGNOSIS — I83212 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 and inflammation: Secondary | ICD-10-CM | POA: Insufficient documentation

## 2016-12-15 DIAGNOSIS — I83223 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 and inflammation: Secondary | ICD-10-CM | POA: Insufficient documentation

## 2017-01-12 ENCOUNTER — Encounter (HOSPITAL_BASED_OUTPATIENT_CLINIC_OR_DEPARTMENT_OTHER): Payer: 59 | Attending: Internal Medicine

## 2017-01-12 DIAGNOSIS — I83223 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 and inflammation: Secondary | ICD-10-CM | POA: Diagnosis not present

## 2017-01-12 DIAGNOSIS — I83212 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 and inflammation: Secondary | ICD-10-CM | POA: Diagnosis not present

## 2017-01-12 DIAGNOSIS — L97322 Non-pressure chronic ulcer of left ankle with fat layer exposed: Secondary | ICD-10-CM | POA: Diagnosis not present

## 2017-01-12 DIAGNOSIS — L97312 Non-pressure chronic ulcer of right ankle with fat layer exposed: Secondary | ICD-10-CM | POA: Insufficient documentation

## 2017-01-23 DIAGNOSIS — I83223 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 and inflammation: Secondary | ICD-10-CM | POA: Diagnosis not present

## 2017-02-12 ENCOUNTER — Encounter (HOSPITAL_BASED_OUTPATIENT_CLINIC_OR_DEPARTMENT_OTHER): Payer: Commercial Managed Care - HMO | Attending: Internal Medicine

## 2017-02-12 DIAGNOSIS — L97312 Non-pressure chronic ulcer of right ankle with fat layer exposed: Secondary | ICD-10-CM | POA: Diagnosis not present

## 2017-02-12 DIAGNOSIS — I83212 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 and inflammation: Secondary | ICD-10-CM | POA: Insufficient documentation

## 2017-02-12 DIAGNOSIS — I83223 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 and inflammation: Secondary | ICD-10-CM | POA: Diagnosis not present

## 2017-02-12 DIAGNOSIS — L97322 Non-pressure chronic ulcer of left ankle with fat layer exposed: Secondary | ICD-10-CM | POA: Diagnosis not present

## 2017-02-26 DIAGNOSIS — I83223 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 and inflammation: Secondary | ICD-10-CM | POA: Diagnosis not present

## 2017-03-20 ENCOUNTER — Encounter (HOSPITAL_BASED_OUTPATIENT_CLINIC_OR_DEPARTMENT_OTHER): Payer: Commercial Managed Care - HMO | Attending: Internal Medicine

## 2017-03-20 DIAGNOSIS — L97312 Non-pressure chronic ulcer of right ankle with fat layer exposed: Secondary | ICD-10-CM | POA: Diagnosis not present

## 2017-03-20 DIAGNOSIS — L97322 Non-pressure chronic ulcer of left ankle with fat layer exposed: Secondary | ICD-10-CM | POA: Insufficient documentation

## 2017-03-20 DIAGNOSIS — I83212 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 and inflammation: Secondary | ICD-10-CM | POA: Insufficient documentation

## 2017-03-20 DIAGNOSIS — I83223 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 and inflammation: Secondary | ICD-10-CM | POA: Insufficient documentation

## 2017-04-07 ENCOUNTER — Ambulatory Visit: Payer: 59 | Attending: Nurse Practitioner | Admitting: Nurse Practitioner

## 2017-04-07 ENCOUNTER — Ambulatory Visit
Admission: RE | Admit: 2017-04-07 | Discharge: 2017-04-07 | Disposition: A | Discharge status: Home / Self Care | Payer: 59 | Source: Ambulatory Visit | Attending: Nurse Practitioner | Admitting: Nurse Practitioner

## 2017-04-07 ENCOUNTER — Encounter: Payer: Self-pay | Admitting: Nurse Practitioner

## 2017-04-07 VITALS — BP 97/47 | HR 61 | Temp 98.4°F | Resp 16 | Ht 68.0 in | Wt 126.0 lb

## 2017-04-07 DIAGNOSIS — Z9889 Other specified postprocedural states: Secondary | ICD-10-CM | POA: Insufficient documentation

## 2017-04-07 DIAGNOSIS — Z87891 Personal history of nicotine dependence: Secondary | ICD-10-CM | POA: Diagnosis not present

## 2017-04-07 DIAGNOSIS — M25562 Pain in left knee: Secondary | ICD-10-CM | POA: Insufficient documentation

## 2017-04-07 DIAGNOSIS — Z833 Family history of diabetes mellitus: Secondary | ICD-10-CM | POA: Insufficient documentation

## 2017-04-07 DIAGNOSIS — Z789 Other specified health status: Secondary | ICD-10-CM | POA: Diagnosis not present

## 2017-04-07 DIAGNOSIS — Z801 Family history of malignant neoplasm of trachea, bronchus and lung: Secondary | ICD-10-CM | POA: Diagnosis not present

## 2017-04-07 DIAGNOSIS — M25571 Pain in right ankle and joints of right foot: Secondary | ICD-10-CM | POA: Insufficient documentation

## 2017-04-07 DIAGNOSIS — Z8249 Family history of ischemic heart disease and other diseases of the circulatory system: Secondary | ICD-10-CM | POA: Insufficient documentation

## 2017-04-07 DIAGNOSIS — M899 Disorder of bone, unspecified: Secondary | ICD-10-CM

## 2017-04-07 DIAGNOSIS — Z79891 Long term (current) use of opiate analgesic: Secondary | ICD-10-CM | POA: Diagnosis not present

## 2017-04-07 DIAGNOSIS — M25572 Pain in left ankle and joints of left foot: Secondary | ICD-10-CM | POA: Insufficient documentation

## 2017-04-07 DIAGNOSIS — M79605 Pain in left leg: Secondary | ICD-10-CM

## 2017-04-07 DIAGNOSIS — G8929 Other chronic pain: Secondary | ICD-10-CM | POA: Diagnosis not present

## 2017-04-07 DIAGNOSIS — Z79899 Other long term (current) drug therapy: Secondary | ICD-10-CM | POA: Diagnosis not present

## 2017-04-07 DIAGNOSIS — M25561 Pain in right knee: Secondary | ICD-10-CM | POA: Insufficient documentation

## 2017-04-07 DIAGNOSIS — I739 Peripheral vascular disease, unspecified: Secondary | ICD-10-CM | POA: Insufficient documentation

## 2017-04-07 DIAGNOSIS — M79604 Pain in right leg: Secondary | ICD-10-CM | POA: Diagnosis not present

## 2017-04-07 DIAGNOSIS — Z880 Allergy status to penicillin: Secondary | ICD-10-CM | POA: Diagnosis not present

## 2017-04-07 NOTE — Progress Notes (Signed)
Patient's Name: [name]  MRN: [id number]  Referring Provider: [name], MD  DOB: 13-Jul-1955  PCP: [name], PA-C  DOS: 04/07/2017  Note by: Dionisio David NP  Service setting: Ambulatory outpatient  Specialty: Interventional Pain Management  Location: [facility] (AMB) Pain Management Facility    Patient type: New Patient    Primary Reason(s) for Visit: Initial Patient Evaluation CC: Ankle Pain (bilateral)  HPI  [name] is a 61 y.o. year old, female patient, who comes today for an initial evaluation. She has Venous insufficiency; Open wound of knee, leg (except thigh), and ankle, complicated; Status post split thickness skin graft; Chronic pain of lower extremity (Secondary Area of Pain) (B) (L>R); Chronic knee pain (Tertiary Area of Pain) (B) ((L>R); Chronic pain of both ankles (Primary Area of Pain) (B) (L>R); Disorder of bone, unspecified; Other long term (current) drug therapy; Other specified health status; and Long term current use of opiate analgesic on their problem list.. Her primarily concern today is the Ankle Pain (bilateral)  Pain Assessment: Location: Right, Left Ankle Radiating: knees Onset:   Duration: Chronic pain Quality: Aching, Constant, Pounding, Nagging, Discomfort, Sore(Nerve pain, constant heartbeat pain) Severity: 10-Worst pain ever/10 (self-reported pain score)  Note: Reported level is compatible with observation. Clinically the patient looks like a 5/10       Information on the proper use of the pain scale provided to the patient today. When using our objective Pain Scale, levels between 6 and 10/10 are said to belong in an emergency room, as it progressively worsens from a 6/10, described as severely limiting, requiring emergency care not usually available at an outpatient pain management facility. At a 6/10 level, communication becomes difficult and requires great effort. Assistance to reach the emergency department may be required. Facial flushing and  profuse sweating along with potentially dangerous increases in heart rate and blood pressure will be evident. Effect on ADL: dampness and wer standing, walking, everyday functions due to walking, ankles want allow her to bend, sleeping Timing: Constant Modifying factors: wound care, medications  Onset and Duration: Date of onset: 2 years go Cause of pain: Unknown Severity: Getting worse, NAS-11 at its worse: 10/10, NAS-11 at its best: 10/10, NAS-11 now: 10/10 and NAS-11 on the average: 10/10 Timing: Morning, Night, During activity or exercise, After activity or exercise and After a period of immobility Aggravating Factors: Climbing, Motion, Prolonged sitting, Prolonged standing, Walking, Walking uphill, Walking downhill and Working Alleviating Factors: Medications and Warm showers or baths Associated Problems: Depression, Fatigue, Numbness, Swelling, Temperature changes, Tingling, Pain that wakes patient up and Pain that does not allow patient to sleep Quality of Pain: Aching, Cruel, Deep, Disabling, Feeling of constriction, Heavy, Horrible, Pressure-like, Pulsating, Punishing, Sharp, Shooting, Splitting, Stabbing, Tender, Throbbing and Uncomfortable Previous Examinations or Tests: MRI scan Previous Treatments: Narcotic medications  The patient comes into the clinics today for the first time for a chronic pain management evaluation. According to the patient her primary area of pain is in her ankles. She admits this is secondary to open ulcers. She admits that she has venous stasis for 10 years. She admits the last 2 years these areas will not heal. She is status post bone debridement and skin graft by Dr. Marla Roe. She is currently being seen by Dr. Quentin Cornwall at the wound care clinic. She admits that she has not tried hyperbaric chamber. She does perform daily dressings with Ace wrap and/or compression wrap. She has increased stiffness in her ankles. She has numbness, tingling  along with sharp  shooting pain. She admits that this keeps her up at night.  She admits that she has bilateral knee pain. She states that left is greater than the right. She denies any previous surgeries, interventional therapies physical therapy or recent images.  Today I took the time to provide the patient with information regarding this pain practice. The patient was informed that the practice is divided into two sections: an interventional pain management section, as well as a completely separate and distinct medication management section. I explained that there are procedure days for interventional therapies, and evaluation days for follow-ups and medication management. Because of the amount of documentation required during both, they are kept separated. This means that there is the possibility that she may be scheduled for a procedure on one day, and medication management the next. I have also informedher that because of staffing and facility limitations, this practice will no longer take patients for medication management only. To illustrate the reasons for this, I gave the patient the example of surgeons, and how inappropriate it would be to refer a patient to her care, just to write for the post-surgical antibiotics on a surgery done by a different surgeon.   Because interventional pain management is part of the board-certified specialty for the doctors, the patient was informed that joining this practice means that they are open to any and all interventional therapies. I made it clear that this does not mean that they will be forced to have any procedures done. What this means is that I believe interventional therapies to be essential part of the diagnosis and proper management of chronic pain conditions. Therefore, patients not interested in these interventional alternatives will be better served under the care of a different practitioner.  The patient was also made aware of my Comprehensive Pain Management  Safety Guidelines where by joining this practice, they limit all of their nerve blocks and joint injections to those done by our practice, for as long as we are retained to manage their care. Historic Controlled Substance Pharmacotherapy Review  PMP and historical list of controlled substances: Hydrocodone/acetaminophen 5/325 g, promethazine with codeine syrup, hydrocodone/acetaminophen 7.5/325 mg, diazepam 10 mg, hydrocodone/acetaminophen 7.5/500, Highest opioid analgesic regimen found: hydrocodone/acetaminophen 5/325 mg 2 tablets every 4 hours (fill date 06/15/2014) hydrocodone 60 mg per day Most recent opioid analgesic: Hydrocodone/acetaminophen 5/325 mg 1 tablet every 6 hours (last fill date 04/03/2017) hydrocodone 20 mg per day Current opioid analgesics: Hydrocodone/acetaminophen 5/325 mg 1 tablet every 6 hours (last fill date 04/03/2017) hydrocodone 20 mg per day Highest recorded MME/day: 60 mg/day MME/day: 20 mg/day Medications: The patient did not bring the medication(s) to the appointment, as requested in our "New Patient Package" Pharmacodynamics: Desired effects: Analgesia: The patient reports >50% benefit. Reported improvement in function: The patient reports medication allows her to accomplish basic ADLs. Clinically meaningful improvement in function (CMIF): Sustained CMIF goals met Perceived effectiveness: Described as relatively effective, allowing for increase in activities of daily living (ADL) Undesirable effects: Side-effects or Adverse reactions: None reported Historical Monitoring: The patient  reports that she does not use drugs. List of all UDS Test(s): No results found for: MDMA, COCAINSCRNUR, PCPSCRNUR, PCPQUANT, CANNABQUANT, THCU, [place] List of all Serum Drug Screening Test(s):  No results found for: AMPHSCRSER, BARBSCRSER, BENZOSCRSER, COCAINSCRSER, PCPSCRSER, PCPQUANT, THCSCRSER, CANNABQUANT, OPIATESCRSER, OXYSCRSER, PROPOXSCRSER Historical Background Evaluation: [place]  PDMP: Six (6) year initial data search conducted.             [place] Department of  public safety, offender search: Editor, commissioning Information) Non-contributory Risk Assessment Profile: Aberrant behavior: None observed or detected today Risk factors for fatal opioid overdose: None identified today Fatal overdose hazard ratio (HR): Calculation deferred Non-fatal overdose hazard ratio (HR): Calculation deferred Risk of opioid abuse or dependence: 0.7-3.0% with doses ? 36 MME/day and 6.1-26% with doses ? 120 MME/day. Substance use disorder (SUD) risk level: Pending results of Medical Psychology Evaluation for SUD Opioid risk tool (ORT) (Total Score):    ORT Scoring interpretation table:  Score <3 = Low Risk for SUD  Score between 4-7 = Moderate Risk for SUD  Score >8 = High Risk for Opioid Abuse   PHQ-2 Depression Scale:  Total score: 2  PHQ-2 Scoring interpretation table: (Score and probability of major depressive disorder)  Score 0 = No depression  Score 1 = 15.4% Probability  Score 2 = 21.1% Probability  Score 3 = 38.4% Probability  Score 4 = 45.5% Probability  Score 5 = 56.4% Probability  Score 6 = 78.6% Probability   PHQ-9 Depression Scale:  Total score: 9  PHQ-9 Scoring interpretation table:  Score 0-4 = No depression  Score 5-9 = Mild depression  Score 10-14 = Moderate depression  Score 15-19 = Moderately severe depression  Score 20-27 = Severe depression (2.4 times higher risk of SUD and 2.89 times higher risk of overuse)   Pharmacologic Plan: Pending ordered tests and/or consults  Meds  The patient has a current medication list which includes the following prescription(s): hydrocodone-acetaminophen, multivitamin, fish oil, santyl, and vitamin c.  Current Outpatient Medications on File Prior to Visit  Medication Sig  . HYDROcodone-acetaminophen (NORCO) 5-325 MG tablet Take 1 tablet by mouth every 6 (six) hours as needed for moderate pain.  . Multiple Vitamin (MULTIVITAMIN)  capsule Take 1 capsule by mouth daily.  . Omega-3 Fatty Acids (FISH OIL) 1000 MG CAPS Take by mouth.  . SANTYL ointment   . vitamin C (ASCORBIC ACID) 500 MG tablet Take 500 mg by mouth daily.   No current facility-administered medications on file prior to visit.    Imaging Review    Note: No new results found.        ROS  Cardiovascular History: Needs antibiotics prior to dental procedures Pulmonary or Respiratory History: No reported pulmonary signs or symptoms such as wheezing and difficulty taking a deep full breath (Asthma), difficulty blowing air out (Emphysema), coughing up mucus (Bronchitis), persistent dry cough, or temporary stoppage of breathing during sleep Neurological History: No reported neurological signs or symptoms such as seizures, abnormal skin sensations, urinary and/or fecal incontinence, being born with an abnormal open spine and/or a tethered spinal cord Review of Past Neurological Studies: No results found for this or any previous visit. Psychological-Psychiatric History: Depressed, Prone to panicking and Difficulty sleeping and or falling asleep Gastrointestinal History: No reported gastrointestinal signs or symptoms such as vomiting or evacuating blood, reflux, heartburn, alternating episodes of diarrhea and constipation, inflamed or scarred liver, or pancreas or irrregular and/or infrequent bowel movements Genitourinary History: No reported renal or genitourinary signs or symptoms such as difficulty voiding or producing urine, peeing blood, non-functioning kidney, kidney stones, difficulty emptying the bladder, difficulty controlling the flow of urine, or chronic kidney disease Hematological History: No reported hematological signs or symptoms such as prolonged bleeding, low or poor functioning platelets, bruising or bleeding easily, hereditary bleeding problems, low energy levels due to low hemoglobin or being anemic Endocrine History: No reported endocrine signs or  symptoms such as high or  low blood sugar, rapid heart rate due to high thyroid levels, obesity or weight gain due to slow thyroid or thyroid disease Rheumatologic History: No reported rheumatological signs and symptoms such as fatigue, joint pain, tenderness, swelling, redness, heat, stiffness, decreased range of motion, with or without associated rash Musculoskeletal History: Negative for myasthenia gravis, muscular dystrophy, multiple sclerosis or malignant hyperthermia Work History: Out on work excuse  Allergies  Ms. Poznanski is allergic to penicillins.  Laboratory Chemistry  Inflammation Markers No results found for: CRP, ESRSEDRATE (CRP: Acute Phase) (ESR: Chronic Phase) Renal Function Markers Lab Results  Component Value Date   BUN 13 10/15/2015   CREATININE 0.70 10/15/2015   Hepatic Function Markers No results found for: AST, ALT, ALBUMIN, ALKPHOS, HCVAB Electrolytes Lab Results  Component Value Date   NA 142 10/15/2015   K 3.6 10/15/2015   CL 103 10/15/2015   Neuropathy Markers Lab Results  Component Value Date   VITAMINB12 395 11/11/2016   Bone Pathology Markers No results found for: [place], [id number], [id number], [id number], 25OHVITD1, 25OHVITD2, 25OHVITD3, CALCIUM, TESTOFREE, TESTOSTERONE Coagulation Parameters No results found for: INR, LABPROT, APTT, PLT Cardiovascular Markers Lab Results  Component Value Date   HGB 13.3 10/15/2015   HCT 39.0 10/15/2015   Note: Lab results reviewed.  [place]  Drug: Ms. Dietzman  reports that she does not use drugs. Alcohol:  reports that she drinks alcohol. Tobacco:  reports that she quit smoking about 9 years ago. Her smoking use included cigarettes. She has a 6.50 pack-year smoking history. she has never used smokeless tobacco. Medical:  has a past medical history of Allergy, Ankle wound (02/2016), Dental bridge present, Dental crowns present, Open wounds involving multiple regions of lower extremity, Peripheral vascular  disease ([place]), and PONV (postoperative nausea and vomiting). Family: family history includes Diabetes in her maternal grandmother and paternal grandmother; Healthy in her brother; Hypertension in her mother; Lung cancer in her father.  Past Surgical History:  Procedure Laterality Date  . [place]  . skin graph    . TONSILLECTOMY     as a child  . VEIN LIGATION AND STRIPPING Left late 1990's   Active Ambulatory Problems    Diagnosis Date Noted  . Venous insufficiency   . Open wound of knee, leg (except thigh), and ankle, complicated 93/71/6967  . Status post split thickness skin graft 03/17/2016  . Chronic pain of lower extremity (Secondary Area of Pain) (B) (L>R) 04/07/2017  . Chronic knee pain [facility] Area of Pain) (B) ((L>R) 04/07/2017  . Chronic pain of both ankles (Primary Area of Pain) (B) (L>R) 04/07/2017  . Disorder of bone, unspecified 04/07/2017  . Other long term (current) drug therapy 04/07/2017  . Other specified health status 04/07/2017  . Long term current use of opiate analgesic 04/07/2017   Resolved Ambulatory Problems    Diagnosis Date Noted  . No Resolved Ambulatory Problems   Past Medical History:  Diagnosis Date  . Allergy   . Ankle wound 02/2016  . Dental bridge present   . Dental crowns present   . Open wounds involving multiple regions of lower extremity   . Peripheral vascular disease ([place])   . PONV (postoperative nausea and vomiting)    Constitutional Exam  General appearance: Well nourished, well developed, and well hydrated. In no apparent acute distress Vitals:   04/07/17 1105  BP: (!) 97/47  Pulse: 61  Resp: 16  Temp: 98.4 F (36.9 C)  SpO2: 100%  Weight: 126  lb (57.2 kg)  Height: 5' 8" (1.727 m)   BMI Assessment: Estimated body mass index is 19.16 kg/m as calculated from the following:   Height as of this encounter: 5' 8" (1.727 m).   Weight as of this encounter: 126 lb (57.2 kg).  BMI interpretation  table: BMI level Category Range association with higher incidence of chronic pain  <18 kg/m2 Underweight   18.5-24.9 kg/m2 Ideal body weight   25-29.9 kg/m2 Overweight Increased incidence by 20%  30-34.9 kg/m2 Obese (Class I) Increased incidence by 68%  35-39.9 kg/m2 Severe obesity (Class II) Increased incidence by 136%  >40 kg/m2 Extreme obesity (Class III) Increased incidence by 254%   BMI Readings from Last 4 Encounters:  04/07/17 19.16 kg/m  10/30/16 19.33 kg/m  02/14/16 17.85 kg/m  01/07/16 18.25 kg/m   Wt Readings from Last 4 Encounters:  04/07/17 126 lb (57.2 kg)  10/30/16 127 lb 2 oz (57.7 kg)  02/14/16 117 lb 6.4 oz (53.3 kg)  01/07/16 120 lb (54.4 kg)  Psych/Mental status: Alert, oriented x 3 (person, place, & time)       Eyes: PERLA Respiratory: No evidence of acute respiratory distress  Cervical Spine Exam  Inspection: No masses, redness, or swelling Alignment: Symmetrical Functional ROM: Unrestricted ROM      Stability: No instability detected Muscle strength & Tone: Functionally intact Sensory: Unimpaired Palpation: No palpable anomalies              Upper Extremity (UE) Exam    Side: Right upper extremity  Side: Left upper extremity  Inspection: No masses, redness, swelling, or asymmetry. No contractures  Inspection: No masses, redness, swelling, or asymmetry. No contractures  Functional ROM: Unrestricted ROM          Functional ROM: Unrestricted ROM          Muscle strength & Tone: Functionally intact  Muscle strength & Tone: Functionally intact  Sensory: Unimpaired  Sensory: Unimpaired  Palpation: No palpable anomalies              Palpation: No palpable anomalies              Specialized Test(s): Deferred         Specialized Test(s): Deferred          Thoracic Spine Exam  Inspection: No masses, redness, or swelling Alignment: Symmetrical Functional ROM: Unrestricted ROM Stability: No instability detected Sensory: Unimpaired Muscle strength & Tone:  No palpable anomalies  Lumbar Spine Exam  Inspection: No masses, redness, or swelling Alignment: Symmetrical Functional ROM: Unrestricted ROM      Stability: No instability detected Muscle strength & Tone: Functionally intact Sensory: Unimpaired Palpation: No palpable anomalies       Provocative Tests: Lumbar Hyperextension and rotation test: evaluation deferred today       Patrick's Maneuver: evaluation deferred today                    Gait & Posture Assessment  Ambulation: Unassisted Gait: Antalgic Posture: WNL   Lower Extremity Exam    Side: Right lower extremity  Side: Left lower extremity  Inspection: Venous stasis edema ulcer not visualized   Inspection: Venous stasis edema 20 cm x 20 cm ulcer  Functional ROM: Unrestricted ROM          Functional ROM: Unrestricted ROM          Muscle strength & Tone: Functionally intact  Muscle strength & Tone: Functionally intact  Sensory: Unimpaired  Sensory: Unimpaired  Palpation: No palpable anomalies  Palpation: No palpable anomalies   Assessment  Primary Diagnosis & Pertinent Problem List: The primary encounter diagnosis was Chronic pain of both ankles. Diagnoses of Chronic pain of both lower extremities, Chronic pain of both knees, Disorder of bone, unspecified, Other long term (current) drug therapy, Other specified health status, and Long term current use of opiate analgesic were also pertinent to this visit.  Visit Diagnosis: 1. Chronic pain of both ankles   2. Chronic pain of both lower extremities   3. Chronic pain of both knees   4. Disorder of bone, unspecified   5. Other long term (current) drug therapy   6. Other specified health status   7. Long term current use of opiate analgesic    Plan of Care  Initial treatment plan:  Please be advised that as per protocol, today's visit has been an evaluation only. We have not taken over the patient's controlled substance management.  Problem-specific plan: No  problem-specific Assessment & Plan notes found for this encounter.  Ordered Lab-work, Procedure(s), Referral(s), & Consult(s): Orders Placed This Encounter  Procedures  . DG Knee 1-2 Views Left  . DG Knee 1-2 Views Right  . DG Ankle Complete Right  . DG Ankle Complete Left  . Compliance Drug Analysis, Ur  . Comp. Metabolic Panel (12)  . C-reactive protein  . Sedimentation rate  . Magnesium  . 25-Hydroxyvitamin D Lcms D2+D3  . Vitamin B12   Pharmacotherapy: Medications ordered:  No orders of the defined types were placed in this encounter.  Medications administered during this visit: Liliahna Cudd had no medications administered during this visit.   Pharmacotherapy under consideration:  Opioid Analgesics: The patient was informed that there is no guarantee that she would be a candidate for opioid analgesics. The decision will be made following CDC guidelines. This decision will be based on the results of diagnostic studies, as well as Ms. Mozingo risk profile.  Membrane stabilizer: To be determined at a later time Muscle relaxant: To be determined at a later time NSAID: To be determined at a later time Other analgesic(s): To be determined at a later time   Interventional therapies under consideration: Ms. Natzke was informed that there is no guarantee that she would be a candidate for interventional therapies. The decision will be based on the results of diagnostic studies, as well as Ms. Zemaitis risk profile.  Possible procedure(s): To be decided by Provider    Provider-requested follow-up: Return for 2nd Visit, w/ Dr. Dossie Arbour, after [facility] eval.  No future appointments.  Primary Care Physician: [name], PA-C Location: [facility] Outpatient Pain Management Facility Note by:  Date: 04/07/2017; Time: 3:06 PM  Pain Score Disclaimer: We use the NRS-11 scale. This is a self-reported, subjective measurement of pain severity with only modest accuracy. It is used primarily to  identify changes within a particular patient. It must be understood that outpatient pain scales are significantly less accurate that those used for research, where they can be applied under ideal controlled circumstances with minimal exposure to variables. In reality, the score is likely to be a combination of pain intensity and pain affect, where pain affect describes the degree of emotional arousal or changes in action readiness caused by the sensory experience of pain. Factors such as social and work situation, setting, emotional state, anxiety levels, expectation, and prior pain experience may influence pain perception and show large inter-individual differences that may also be affected by time variables.  Patient instructions provided  during this appointment: Patient Instructions    ____________________________________________________________________________________________  Appointment Policy Summary  It is our goal and responsibility to provide the medical community with assistance in the evaluation and management of patients with chronic pain. Unfortunately our resources are limited. Because we do not have an unlimited amount of time, or available appointments, we are required to closely monitor and manage their use. The following rules exist to maximize their use:  Patient's responsibilities: 1. Punctuality:  At what time should I arrive? You should be physically present in our office 30 minutes before your scheduled appointment. Your scheduled appointment is with your assigned healthcare provider. However, it takes 5-10 minutes to be "checked-in", and another 15 minutes for the nurses to do the admission. If you arrive to our office at the time you were given for your appointment, you will end up being at least 20-25 minutes late to your appointment with the provider. 2. Tardiness:  What happens if I arrive only a few minutes after my scheduled appointment time? You will need to reschedule  your appointment. The cutoff is your appointment time. This is why it is so important that you arrive at least 30 minutes before that appointment. If you have an appointment scheduled for 10:00 AM and you arrive at 10:01, you will be required to reschedule your appointment.  3. Plan ahead:  Always assume that you will encounter traffic on your way in. Plan for it. If you are dependent on a driver, make sure they understand these rules and the need to arrive early. 4. Other appointments and responsibilities:  Avoid scheduling any other appointments before or after your pain clinic appointments.  5. Be prepared:  Write down everything that you need to discuss with your healthcare provider and give this information to the admitting nurse. Write down the medications that you will need refilled. Bring your pills and bottles (even the empty ones), to all of your appointments, except for those where a procedure is scheduled. 6. No children or pets:  Find someone to take care of them. It is not appropriate to bring them in. 7. Scheduling changes:  We request "advanced notification" of any changes or cancellations. 8. Advanced notification:  Defined as a time period of more than 24 hours prior to the originally scheduled appointment. This allows for the appointment to be offered to other patients. 9. Rescheduling:  When a visit is rescheduled, it will require the cancellation of the original appointment. For this reason they both fall within the category of "Cancellations".  10. Cancellations:  They require advanced notification. Any cancellation less than 24 hours before the  appointment will be recorded as a "No Show". 11. No Show:  Defined as an unkept appointment where the patient failed to notify or declare to the practice their intention or inability to keep the appointment.  Corrective process for repeat offenders:  1. Tardiness: Three (3) episodes of rescheduling due to late arrivals will be  recorded as one (1) "No Show". 2. Cancellation or reschedule: Three (3) cancellations or rescheduling will be recorded as one (1) "No Show". 3. "No Shows": Three (3) "No Shows" within a 12 month period will result in discharge from the practice.  ____________________________________________________________________________________________  ____________________________________________________________________________________________  Pain Scale  Introduction: The pain score used by this practice is the Verbal Numerical Rating Scale (VNRS-11). This is an 11-point scale. It is for adults and children 10 years or older. There are significant differences in how the pain score is reported, used, and  applied. Forget everything you learned in the past and learn this scoring system.  General Information: The scale should reflect your current level of pain. Unless you are specifically asked for the level of your worst pain, or your average pain. If you are asked for one of these two, then it should be understood that it is over the past 24 hours.  Basic Activities of Daily Living (ADL): Personal hygiene, dressing, eating, transferring, and using restroom.  Instructions: Most patients tend to report their level of pain as a combination of two factors, their physical pain and their psychosocial pain. This last one is also known as "suffering" and it is reflection of how physical pain affects you socially and psychologically. From now on, report them separately. From this point on, when asked to report your pain level, report only your physical pain. Use the following table for reference.  Pain Clinic Pain Levels (0-5/10)  Pain Level Score  Description  No Pain 0   Mild pain 1 Nagging, annoying, but does not interfere with basic activities of daily living (ADL). Patients are able to eat, bathe, get dressed, toileting (being able to get on and off the toilet and perform personal hygiene functions), transfer  (move in and out of bed or a chair without assistance), and maintain continence (able to control bladder and bowel functions). Blood pressure and heart rate are unaffected. A normal heart rate for a healthy adult ranges from 60 to 100 bpm (beats per minute).   Mild to moderate pain 2 Noticeable and distracting. Impossible to hide from other people. More frequent flare-ups. Still possible to adapt and function close to normal. It can be very annoying and may have occasional stronger flare-ups. With discipline, patients may get used to it and adapt.   Moderate pain 3 Interferes significantly with activities of daily living (ADL). It becomes difficult to feed, bathe, get dressed, get on and off the toilet or to perform personal hygiene functions. Difficult to get in and out of bed or a chair without assistance. Very distracting. With effort, it can be ignored when deeply involved in activities.   Moderately severe pain 4 Impossible to ignore for more than a few minutes. With effort, patients may still be able to manage work or participate in some social activities. Very difficult to concentrate. Signs of autonomic nervous system discharge are evident: dilated pupils (mydriasis); mild sweating (diaphoresis); sleep interference. Heart rate becomes elevated (>115 bpm). Diastolic blood pressure (lower number) rises above 100 mmHg. Patients find relief in laying down and not moving.   Severe pain 5 Intense and extremely unpleasant. Associated with frowning face and frequent crying. Pain overwhelms the senses.  Ability to do any activity or maintain social relationships becomes significantly limited. Conversation becomes difficult. Pacing back and forth is common, as getting into a comfortable position is nearly impossible. Pain wakes you up from deep sleep. Physical signs will be obvious: pupillary dilation; increased sweating; goosebumps; brisk reflexes; cold, clammy hands and feet; nausea, vomiting or dry heaves;  loss of appetite; significant sleep disturbance with inability to fall asleep or to remain asleep. When persistent, significant weight loss is observed due to the complete loss of appetite and sleep deprivation.  Blood pressure and heart rate becomes significantly elevated. Caution: If elevated blood pressure triggers a pounding headache associated with blurred vision, then the patient should immediately seek attention at an urgent or emergency care unit, as these may be signs of an impending stroke.    Emergency  Department Pain Levels (6-10/10)  Emergency Room Pain 6 Severely limiting. Requires emergency care and should not be seen or managed at an outpatient pain management facility. Communication becomes difficult and requires great effort. Assistance to reach the emergency department may be required. Facial flushing and profuse sweating along with potentially dangerous increases in heart rate and blood pressure will be evident.   Distressing pain 7 Self-care is very difficult. Assistance is required to transport, or use restroom. Assistance to reach the emergency department will be required. Tasks requiring coordination, such as bathing and getting dressed become very difficult.   Disabling pain 8 Self-care is no longer possible. At this level, pain is disabling. The individual is unable to do even the most "basic" activities such as walking, eating, bathing, dressing, transferring to a bed, or toileting. Fine motor skills are lost. It is difficult to think clearly.   Incapacitating pain 9 Pain becomes incapacitating. Thought processing is no longer possible. Difficult to remember your own name. Control of movement and coordination are lost.   The worst pain imaginable 10 At this level, most patients pass out from pain. When this level is reached, collapse of the autonomic nervous system occurs, leading to a sudden drop in blood pressure and heart rate. This in turn results in a temporary and  dramatic drop in blood flow to the brain, leading to a loss of consciousness. Fainting is one of the body's self defense mechanisms. Passing out puts the brain in a calmed state and causes it to shut down for a while, in order to begin the healing process.    Summary: 1. Refer to this scale when providing [place] with your pain level. 2. Be accurate and careful when reporting your pain level. This will help with your care. 3. Over-reporting your pain level will lead to loss of credibility. 4. Even a level of 1/10 means that there is pain and will be treated at our facility. 5. High, inaccurate reporting will be documented as "Symptom Exaggeration", leading to loss of credibility and suspicions of possible secondary gains such as obtaining more narcotics, or wanting to appear disabled, for fraudulent reasons. 6. Only pain levels of 5 or below will be seen at our facility. 7. Pain levels of 6 and above will be sent to the Emergency Department and the appointment cancelled. ____________________________________________________________________________________________  ____________________________________________________________________________________________  Pain Scale  Introduction: The pain score used by this practice is the Verbal Numerical Rating Scale (VNRS-11). This is an 11-point scale. It is for adults and children 10 years or older. There are significant differences in how the pain score is reported, used, and applied. Forget everything you learned in the past and learn this scoring system.  General Information: The scale should reflect your current level of pain. Unless you are specifically asked for the level of your worst pain, or your average pain. If you are asked for one of these two, then it should be understood that it is over the past 24 hours.  Basic Activities of Daily Living (ADL): Personal hygiene, dressing, eating, transferring, and using restroom.  Instructions: Most patients  tend to report their level of pain as a combination of two factors, their physical pain and their psychosocial pain. This last one is also known as "suffering" and it is reflection of how physical pain affects you socially and psychologically. From now on, report them separately. From this point on, when asked to report your pain level, report only your physical pain. Use the following table  for reference.  Pain Clinic Pain Levels (0-5/10)  Pain Level Score  Description  No Pain 0   Mild pain 1 Nagging, annoying, but does not interfere with basic activities of daily living (ADL). Patients are able to eat, bathe, get dressed, toileting (being able to get on and off the toilet and perform personal hygiene functions), transfer (move in and out of bed or a chair without assistance), and maintain continence (able to control bladder and bowel functions). Blood pressure and heart rate are unaffected. A normal heart rate for a healthy adult ranges from 60 to 100 bpm (beats per minute).   Mild to moderate pain 2 Noticeable and distracting. Impossible to hide from other people. More frequent flare-ups. Still possible to adapt and function close to normal. It can be very annoying and may have occasional stronger flare-ups. With discipline, patients may get used to it and adapt.   Moderate pain 3 Interferes significantly with activities of daily living (ADL). It becomes difficult to feed, bathe, get dressed, get on and off the toilet or to perform personal hygiene functions. Difficult to get in and out of bed or a chair without assistance. Very distracting. With effort, it can be ignored when deeply involved in activities.   Moderately severe pain 4 Impossible to ignore for more than a few minutes. With effort, patients may still be able to manage work or participate in some social activities. Very difficult to concentrate. Signs of autonomic nervous system discharge are evident: dilated pupils (mydriasis); mild  sweating (diaphoresis); sleep interference. Heart rate becomes elevated (>115 bpm). Diastolic blood pressure (lower number) rises above 100 mmHg. Patients find relief in laying down and not moving.   Severe pain 5 Intense and extremely unpleasant. Associated with frowning face and frequent crying. Pain overwhelms the senses.  Ability to do any activity or maintain social relationships becomes significantly limited. Conversation becomes difficult. Pacing back and forth is common, as getting into a comfortable position is nearly impossible. Pain wakes you up from deep sleep. Physical signs will be obvious: pupillary dilation; increased sweating; goosebumps; brisk reflexes; cold, clammy hands and feet; nausea, vomiting or dry heaves; loss of appetite; significant sleep disturbance with inability to fall asleep or to remain asleep. When persistent, significant weight loss is observed due to the complete loss of appetite and sleep deprivation.  Blood pressure and heart rate becomes significantly elevated. Caution: If elevated blood pressure triggers a pounding headache associated with blurred vision, then the patient should immediately seek attention at an urgent or emergency care unit, as these may be signs of an impending stroke.    Emergency Department Pain Levels (6-10/10)  Emergency Room Pain 6 Severely limiting. Requires emergency care and should not be seen or managed at an outpatient pain management facility. Communication becomes difficult and requires great effort. Assistance to reach the emergency department may be required. Facial flushing and profuse sweating along with potentially dangerous increases in heart rate and blood pressure will be evident.   Distressing pain 7 Self-care is very difficult. Assistance is required to transport, or use restroom. Assistance to reach the emergency department will be required. Tasks requiring coordination, such as bathing and getting dressed become very  difficult.   Disabling pain 8 Self-care is no longer possible. At this level, pain is disabling. The individual is unable to do even the most "basic" activities such as walking, eating, bathing, dressing, transferring to a bed, or toileting. Fine motor skills are lost. It is difficult to think  clearly.   Incapacitating pain 9 Pain becomes incapacitating. Thought processing is no longer possible. Difficult to remember your own name. Control of movement and coordination are lost.   The worst pain imaginable 10 At this level, most patients pass out from pain. When this level is reached, collapse of the autonomic nervous system occurs, leading to a sudden drop in blood pressure and heart rate. This in turn results in a temporary and dramatic drop in blood flow to the brain, leading to a loss of consciousness. Fainting is one of the body's self defense mechanisms. Passing out puts the brain in a calmed state and causes it to shut down for a while, in order to begin the healing process.    Summary: 8. Refer to this scale when providing [place] with your pain level. 9. Be accurate and careful when reporting your pain level. This will help with your care. 10. Over-reporting your pain level will lead to loss of credibility. 11. Even a level of 1/10 means that there is pain and will be treated at our facility. 12. High, inaccurate reporting will be documented as "Symptom Exaggeration", leading to loss of credibility and suspicions of possible secondary gains such as obtaining more narcotics, or wanting to appear disabled, for fraudulent reasons. 13. Only pain levels of 5 or below will be seen at our facility. 14. Pain levels of 6 and above will be sent to the Emergency Department and the appointment cancelled. ____________________________________________________________________________________________   ____________________________________________________________________________________________  Medication  Rules  Applies to: All patients receiving prescriptions (written or electronic).  Pharmacy of record: Pharmacy where electronic prescriptions will be sent. If written prescriptions are taken to a different pharmacy, please inform the nursing staff. The pharmacy listed in the electronic medical record should be the one where you would like electronic prescriptions to be sent.  Prescription refills: Only during scheduled appointments. Applies to both, written and electronic prescriptions.  NOTE: The following applies primarily to controlled substances (Opioid* Pain Medications).   Patient's responsibilities: 1. Pain Pills: Bring all pain pills to every appointment (except for procedure appointments). 2. Pill Bottles: Bring pills in original pharmacy bottle. Always bring newest bottle. Bring bottle, even if empty. 3. Medication refills: You are responsible for knowing and keeping track of what medications you need refilled. The day before your appointment, write a list of all prescriptions that need to be refilled. Bring that list to your appointment and give it to the admitting nurse. Prescriptions will be written only during appointments. If you forget a medication, it will not be "Called in", "Faxed", or "electronically sent". You will need to get another appointment to get these prescribed. 4. Prescription Accuracy: You are responsible for carefully inspecting your prescriptions before leaving our office. Have the discharge nurse carefully go over each prescription with you, before taking them home. Make sure that your name is accurately spelled, that your address is correct. Check the name and dose of your medication to make sure it is accurate. Check the number of pills, and the written instructions to make sure they are clear and accurate. Make sure that you are given enough medication to last until your next medication refill appointment. 5. Taking Medication: Take medication as prescribed. Never  take more pills than instructed. Never take medication more frequently than prescribed. Taking less pills or less frequently is permitted and encouraged, when it comes to controlled substances (written prescriptions).  6. Inform other Doctors: Always inform, all of your healthcare providers, of all the medications  you take. 7. Pain Medication from other Providers: You are not allowed to accept any additional pain medication from any other Doctor or Healthcare provider. There are two exceptions to this rule. (see below) In the event that you require additional pain medication, you are responsible for notifying us, as stated below. 8. Medication Agreement: You are responsible for carefully reading and following our Medication Agreement. This must be signed before receiving any prescriptions from our practice. Safely store a copy of your signed Agreement. Violations to the Agreement will result in no further prescriptions. (Additional copies of our Medication Agreement are available upon request.) 9. Laws, Rules, & Regulations: All patients are expected to follow all Federal and [employer], Rules, [employer]. Ignorance of the Laws does not constitute a valid excuse. The use of any illegal substances is prohibited. 10. Adopted CDC guidelines & recommendations: Target dosing levels will be at or below 60 MME/day. Use of benzodiazepines** is not recommended.  Exceptions: There are only two exceptions to the rule of not receiving pain medications from other Healthcare Providers. 1. Exception #1 (Emergencies): In the event of an emergency (i.e.: accident requiring emergency care), you are allowed to receive additional pain medication. However, you are responsible for: As soon as you are able, call our office [phone], at any time of the day or night, and leave a message stating your name, the date and nature of the emergency, and the name and dose of the medication prescribed. In the event that  your call is answered by a member of our staff, make sure to document and save the date, time, and the name of the person that took your information.  2. Exception #2 (Planned Surgery): In the event that you are scheduled by another doctor or dentist to have any type of surgery or procedure, you are allowed (for a period no longer than 30 days), to receive additional pain medication, for the acute post-op pain. However, in this case, you are responsible for picking up a copy of our "Post-op Pain Management for Surgeons" handout, and giving it to your surgeon or dentist. This document is available at our office, and does not require an appointment to obtain it. Simply go to our office during business hours (Monday-Thursday from 8:00 AM to 4:00 PM) (Friday 8:00 AM to 12:00 Noon) or if you have a scheduled appointment with [place], prior to your surgery, and ask for it by name. In addition, you will need to provide [place] with your name, name of your surgeon, type of surgery, and date of procedure or surgery.  *Opioid medications include: morphine, codeine, oxycodone, oxymorphone, hydrocodone, hydromorphone, meperidine, tramadol, tapentadol, buprenorphine, fentanyl, methadone. **Benzodiazepine medications include: diazepam (Valium), alprazolam (Xanax), clonazepam (Klonopine), lorazepam (Ativan), clorazepate (Tranxene), chlordiazepoxide (Librium), estazolam (Prosom), oxazepam (Serax), temazepam (Restoril), triazolam (Halcion)  ____________________________________________________________________________________________

## 2017-04-07 NOTE — Progress Notes (Signed)
Safety precautions to be maintained throughout the outpatient stay will include: orient to surroundings, keep bed in low position, maintain call bell within reach at all times, provide assistance with transfer out of bed and ambulation.  

## 2017-04-07 NOTE — Patient Instructions (Addendum)
____________________________________________________________________________________________  Appointment Policy Summary  It is our goal and responsibility to provide the medical community with assistance in the evaluation and management of patients with chronic pain. Unfortunately our resources are limited. Because we do not have an unlimited amount of time, or available appointments, we are required to closely monitor and manage their use. The following rules exist to maximize their use:  Patient's responsibilities: 1. Punctuality:  At what time should I arrive? You should be physically present in our office 30 minutes before your scheduled appointment. Your scheduled appointment is with your assigned healthcare provider. However, it takes 5-10 minutes to be "checked-in", and another 15 minutes for the nurses to do the admission. If you arrive to our office at the time you were given for your appointment, you will end up being at least 20-25 minutes late to your appointment with the provider. 2. Tardiness:  What happens if I arrive only a few minutes after my scheduled appointment time? You will need to reschedule your appointment. The cutoff is your appointment time. This is why it is so important that you arrive at least 30 minutes before that appointment. If you have an appointment scheduled for 10:00 AM and you arrive at 10:01, you will be required to reschedule your appointment.  3. Plan ahead:  Always assume that you will encounter traffic on your way in. Plan for it. If you are dependent on a driver, make sure they understand these rules and the need to arrive early. 4. Other appointments and responsibilities:  Avoid scheduling any other appointments before or after your pain clinic appointments.  5. Be prepared:  Write down everything that you need to discuss with your healthcare provider and give this information to the admitting nurse. Write down the medications that you will need  refilled. Bring your pills and bottles (even the empty ones), to all of your appointments, except for those where a procedure is scheduled. 6. No children or pets:  Find someone to take care of them. It is not appropriate to bring them in. 7. Scheduling changes:  We request "advanced notification" of any changes or cancellations. 8. Advanced notification:  Defined as a time period of more than 24 hours prior to the originally scheduled appointment. This allows for the appointment to be offered to other patients. 9. Rescheduling:  When a visit is rescheduled, it will require the cancellation of the original appointment. For this reason they both fall within the category of "Cancellations".  10. Cancellations:  They require advanced notification. Any cancellation less than 24 hours before the  appointment will be recorded as a "No Show". 11. No Show:  Defined as an unkept appointment where the patient failed to notify or declare to the practice their intention or inability to keep the appointment.  Corrective process for repeat offenders:  1. Tardiness: Three (3) episodes of rescheduling due to late arrivals will be recorded as one (1) "No Show". 2. Cancellation or reschedule: Three (3) cancellations or rescheduling will be recorded as one (1) "No Show". 3. "No Shows": Three (3) "No Shows" within a 12 month period will result in discharge from the practice.  ____________________________________________________________________________________________  ____________________________________________________________________________________________  Pain Scale  Introduction: The pain score used by this practice is the Verbal Numerical Rating Scale (VNRS-11). This is an 11-point scale. It is for adults and children 10 years or older. There are significant differences in how the pain score is reported, used, and applied. Forget everything you learned in the past and  learn this scoring  system.  General Information: The scale should reflect your current level of pain. Unless you are specifically asked for the level of your worst pain, or your average pain. If you are asked for one of these two, then it should be understood that it is over the past 24 hours.  Basic Activities of Daily Living (ADL): Personal hygiene, dressing, eating, transferring, and using restroom.  Instructions: Most patients tend to report their level of pain as a combination of two factors, their physical pain and their psychosocial pain. This last one is also known as "suffering" and it is reflection of how physical pain affects you socially and psychologically. From now on, report them separately. From this point on, when asked to report your pain level, report only your physical pain. Use the following table for reference.  Pain Clinic Pain Levels (0-5/10)  Pain Level Score  Description  No Pain 0   Mild pain 1 Nagging, annoying, but does not interfere with basic activities of daily living (ADL). Patients are able to eat, bathe, get dressed, toileting (being able to get on and off the toilet and perform personal hygiene functions), transfer (move in and out of bed or a chair without assistance), and maintain continence (able to control bladder and bowel functions). Blood pressure and heart rate are unaffected. A normal heart rate for a healthy adult ranges from 60 to 100 bpm (beats per minute).   Mild to moderate pain 2 Noticeable and distracting. Impossible to hide from other people. More frequent flare-ups. Still possible to adapt and function close to normal. It can be very annoying and may have occasional stronger flare-ups. With discipline, patients may get used to it and adapt.   Moderate pain 3 Interferes significantly with activities of daily living (ADL). It becomes difficult to feed, bathe, get dressed, get on and off the toilet or to perform personal hygiene functions. Difficult to get in and out of  bed or a chair without assistance. Very distracting. With effort, it can be ignored when deeply involved in activities.   Moderately severe pain 4 Impossible to ignore for more than a few minutes. With effort, patients may still be able to manage work or participate in some social activities. Very difficult to concentrate. Signs of autonomic nervous system discharge are evident: dilated pupils (mydriasis); mild sweating (diaphoresis); sleep interference. Heart rate becomes elevated (>115 bpm). Diastolic blood pressure (lower number) rises above 100 mmHg. Patients find relief in laying down and not moving.   Severe pain 5 Intense and extremely unpleasant. Associated with frowning face and frequent crying. Pain overwhelms the senses.  Ability to do any activity or maintain social relationships becomes significantly limited. Conversation becomes difficult. Pacing back and forth is common, as getting into a comfortable position is nearly impossible. Pain wakes you up from deep sleep. Physical signs will be obvious: pupillary dilation; increased sweating; goosebumps; brisk reflexes; cold, clammy hands and feet; nausea, vomiting or dry heaves; loss of appetite; significant sleep disturbance with inability to fall asleep or to remain asleep. When persistent, significant weight loss is observed due to the complete loss of appetite and sleep deprivation.  Blood pressure and heart rate becomes significantly elevated. Caution: If elevated blood pressure triggers a pounding headache associated with blurred vision, then the patient should immediately seek attention at an urgent or emergency care unit, as these may be signs of an impending stroke.    Emergency Department Pain Levels (6-10/10)  Emergency Room Pain 6   Severely limiting. Requires emergency care and should not be seen or managed at an outpatient pain management facility. Communication becomes difficult and requires great effort. Assistance to reach the  emergency department may be required. Facial flushing and profuse sweating along with potentially dangerous increases in heart rate and blood pressure will be evident.   Distressing pain 7 Self-care is very difficult. Assistance is required to transport, or use restroom. Assistance to reach the emergency department will be required. Tasks requiring coordination, such as bathing and getting dressed become very difficult.   Disabling pain 8 Self-care is no longer possible. At this level, pain is disabling. The individual is unable to do even the most "basic" activities such as walking, eating, bathing, dressing, transferring to a bed, or toileting. Fine motor skills are lost. It is difficult to think clearly.   Incapacitating pain 9 Pain becomes incapacitating. Thought processing is no longer possible. Difficult to remember your own name. Control of movement and coordination are lost.   The worst pain imaginable 10 At this level, most patients pass out from pain. When this level is reached, collapse of the autonomic nervous system occurs, leading to a sudden drop in blood pressure and heart rate. This in turn results in a temporary and dramatic drop in blood flow to the brain, leading to a loss of consciousness. Fainting is one of the body's self defense mechanisms. Passing out puts the brain in a calmed state and causes it to shut down for a while, in order to begin the healing process.    Summary: 1. Refer to this scale when providing [place] with your pain level. 2. Be accurate and careful when reporting your pain level. This will help with your care. 3. Over-reporting your pain level will lead to loss of credibility. 4. Even a level of 1/10 means that there is pain and will be treated at our facility. 5. High, inaccurate reporting will be documented as "Symptom Exaggeration", leading to loss of credibility and suspicions of possible secondary gains such as obtaining more narcotics, or wanting to appear  disabled, for fraudulent reasons. 6. Only pain levels of 5 or below will be seen at our facility. 7. Pain levels of 6 and above will be sent to the Emergency Department and the appointment cancelled. ____________________________________________________________________________________________  ____________________________________________________________________________________________  Pain Scale  Introduction: The pain score used by this practice is the Verbal Numerical Rating Scale (VNRS-11). This is an 11-point scale. It is for adults and children 10 years or older. There are significant differences in how the pain score is reported, used, and applied. Forget everything you learned in the past and learn this scoring system.  General Information: The scale should reflect your current level of pain. Unless you are specifically asked for the level of your worst pain, or your average pain. If you are asked for one of these two, then it should be understood that it is over the past 24 hours.  Basic Activities of Daily Living (ADL): Personal hygiene, dressing, eating, transferring, and using restroom.  Instructions: Most patients tend to report their level of pain as a combination of two factors, their physical pain and their psychosocial pain. This last one is also known as "suffering" and it is reflection of how physical pain affects you socially and psychologically. From now on, report them separately. From this point on, when asked to report your pain level, report only your physical pain. Use the following table for reference.  Pain Clinic Pain Levels (0-5/10)  Pain Level Score  Description  No Pain 0   Mild pain 1 Nagging, annoying, but does not interfere with basic activities of daily living (ADL). Patients are able to eat, bathe, get dressed, toileting (being able to get on and off the toilet and perform personal hygiene functions), transfer (move in and out of bed or a chair without  assistance), and maintain continence (able to control bladder and bowel functions). Blood pressure and heart rate are unaffected. A normal heart rate for a healthy adult ranges from 60 to 100 bpm (beats per minute).   Mild to moderate pain 2 Noticeable and distracting. Impossible to hide from other people. More frequent flare-ups. Still possible to adapt and function close to normal. It can be very annoying and may have occasional stronger flare-ups. With discipline, patients may get used to it and adapt.   Moderate pain 3 Interferes significantly with activities of daily living (ADL). It becomes difficult to feed, bathe, get dressed, get on and off the toilet or to perform personal hygiene functions. Difficult to get in and out of bed or a chair without assistance. Very distracting. With effort, it can be ignored when deeply involved in activities.   Moderately severe pain 4 Impossible to ignore for more than a few minutes. With effort, patients may still be able to manage work or participate in some social activities. Very difficult to concentrate. Signs of autonomic nervous system discharge are evident: dilated pupils (mydriasis); mild sweating (diaphoresis); sleep interference. Heart rate becomes elevated (>115 bpm). Diastolic blood pressure (lower number) rises above 100 mmHg. Patients find relief in laying down and not moving.   Severe pain 5 Intense and extremely unpleasant. Associated with frowning face and frequent crying. Pain overwhelms the senses.  Ability to do any activity or maintain social relationships becomes significantly limited. Conversation becomes difficult. Pacing back and forth is common, as getting into a comfortable position is nearly impossible. Pain wakes you up from deep sleep. Physical signs will be obvious: pupillary dilation; increased sweating; goosebumps; brisk reflexes; cold, clammy hands and feet; nausea, vomiting or dry heaves; loss of appetite; significant sleep  disturbance with inability to fall asleep or to remain asleep. When persistent, significant weight loss is observed due to the complete loss of appetite and sleep deprivation.  Blood pressure and heart rate becomes significantly elevated. Caution: If elevated blood pressure triggers a pounding headache associated with blurred vision, then the patient should immediately seek attention at an urgent or emergency care unit, as these may be signs of an impending stroke.    Emergency Department Pain Levels (6-10/10)  Emergency Room Pain 6 Severely limiting. Requires emergency care and should not be seen or managed at an outpatient pain management facility. Communication becomes difficult and requires great effort. Assistance to reach the emergency department may be required. Facial flushing and profuse sweating along with potentially dangerous increases in heart rate and blood pressure will be evident.   Distressing pain 7 Self-care is very difficult. Assistance is required to transport, or use restroom. Assistance to reach the emergency department will be required. Tasks requiring coordination, such as bathing and getting dressed become very difficult.   Disabling pain 8 Self-care is no longer possible. At this level, pain is disabling. The individual is unable to do even the most "basic" activities such as walking, eating, bathing, dressing, transferring to a bed, or toileting. Fine motor skills are lost. It is difficult to think clearly.   Incapacitating pain 9 Pain becomes incapacitating.  Thought processing is no longer possible. Difficult to remember your own name. Control of movement and coordination are lost.   The worst pain imaginable 10 At this level, most patients pass out from pain. When this level is reached, collapse of the autonomic nervous system occurs, leading to a sudden drop in blood pressure and heart rate. This in turn results in a temporary and dramatic drop in blood flow to the brain,  leading to a loss of consciousness. Fainting is one of the body's self defense mechanisms. Passing out puts the brain in a calmed state and causes it to shut down for a while, in order to begin the healing process.    Summary: 8. Refer to this scale when providing [place] with your pain level. 9. Be accurate and careful when reporting your pain level. This will help with your care. 10. Over-reporting your pain level will lead to loss of credibility. 11. Even a level of 1/10 means that there is pain and will be treated at our facility. 12. High, inaccurate reporting will be documented as "Symptom Exaggeration", leading to loss of credibility and suspicions of possible secondary gains such as obtaining more narcotics, or wanting to appear disabled, for fraudulent reasons. 13. Only pain levels of 5 or below will be seen at our facility. 14. Pain levels of 6 and above will be sent to the Emergency Department and the appointment cancelled. ____________________________________________________________________________________________   ____________________________________________________________________________________________  Medication Rules  Applies to: All patients receiving prescriptions (written or electronic).  Pharmacy of record: Pharmacy where electronic prescriptions will be sent. If written prescriptions are taken to a different pharmacy, please inform the nursing staff. The pharmacy listed in the electronic medical record should be the one where you would like electronic prescriptions to be sent.  Prescription refills: Only during scheduled appointments. Applies to both, written and electronic prescriptions.  NOTE: The following applies primarily to controlled substances (Opioid* Pain Medications).   Patient's responsibilities: 1. Pain Pills: Bring all pain pills to every appointment (except for procedure appointments). 2. Pill Bottles: Bring pills in original pharmacy bottle. Always  bring newest bottle. Bring bottle, even if empty. 3. Medication refills: You are responsible for knowing and keeping track of what medications you need refilled. The day before your appointment, write a list of all prescriptions that need to be refilled. Bring that list to your appointment and give it to the admitting nurse. Prescriptions will be written only during appointments. If you forget a medication, it will not be "Called in", "Faxed", or "electronically sent". You will need to get another appointment to get these prescribed. 4. Prescription Accuracy: You are responsible for carefully inspecting your prescriptions before leaving our office. Have the discharge nurse carefully go over each prescription with you, before taking them home. Make sure that your name is accurately spelled, that your address is correct. Check the name and dose of your medication to make sure it is accurate. Check the number of pills, and the written instructions to make sure they are clear and accurate. Make sure that you are given enough medication to last until your next medication refill appointment. 5. Taking Medication: Take medication as prescribed. Never take more pills than instructed. Never take medication more frequently than prescribed. Taking less pills or less frequently is permitted and encouraged, when it comes to controlled substances (written prescriptions).  6. Inform other Doctors: Always inform, all of your healthcare providers, of all the medications you take. 7. Pain Medication from other Providers: You  are not allowed to accept any additional pain medication from any other Doctor or Healthcare provider. There are two exceptions to this rule. (see below) In the event that you require additional pain medication, you are responsible for notifying us, as stated below. 8. Medication Agreement: You are responsible for carefully reading and following our Medication Agreement. This must be signed before receiving  any prescriptions from our practice. Safely store a copy of your signed Agreement. Violations to the Agreement will result in no further prescriptions. (Additional copies of our Medication Agreement are available upon request.) 9. Laws, Rules, & Regulations: All patients are expected to follow all Federal and [employer], Rules, [employer]. Ignorance of the Laws does not constitute a valid excuse. The use of any illegal substances is prohibited. 10. Adopted CDC guidelines & recommendations: Target dosing levels will be at or below 60 MME/day. Use of benzodiazepines** is not recommended.  Exceptions: There are only two exceptions to the rule of not receiving pain medications from other Healthcare Providers. 1. Exception #1 (Emergencies): In the event of an emergency (i.e.: accident requiring emergency care), you are allowed to receive additional pain medication. However, you are responsible for: As soon as you are able, call our office [phone], at any time of the day or night, and leave a message stating your name, the date and nature of the emergency, and the name and dose of the medication prescribed. In the event that your call is answered by a member of our staff, make sure to document and save the date, time, and the name of the person that took your information.  2. Exception #2 (Planned Surgery): In the event that you are scheduled by another doctor or dentist to have any type of surgery or procedure, you are allowed (for a period no longer than 30 days), to receive additional pain medication, for the acute post-op pain. However, in this case, you are responsible for picking up a copy of our "Post-op Pain Management for Surgeons" handout, and giving it to your surgeon or dentist. This document is available at our office, and does not require an appointment to obtain it. Simply go to our office during business hours (Monday-Thursday from 8:00 AM to 4:00 PM) (Friday 8:00 AM to 12:00  Noon) or if you have a scheduled appointment with [place], prior to your surgery, and ask for it by name. In addition, you will need to provide [place] with your name, name of your surgeon, type of surgery, and date of procedure or surgery.  *Opioid medications include: morphine, codeine, oxycodone, oxymorphone, hydrocodone, hydromorphone, meperidine, tramadol, tapentadol, buprenorphine, fentanyl, methadone. **Benzodiazepine medications include: diazepam (Valium), alprazolam (Xanax), clonazepam (Klonopine), lorazepam (Ativan), clorazepate (Tranxene), chlordiazepoxide (Librium), estazolam (Prosom), oxazepam (Serax), temazepam (Restoril), triazolam (Halcion)  ____________________________________________________________________________________________

## 2017-04-08 NOTE — Progress Notes (Signed)
Results were reviewed and found to be: mildly abnormal  No acute injury or pathology identified

## 2017-04-10 ENCOUNTER — Encounter (HOSPITAL_BASED_OUTPATIENT_CLINIC_OR_DEPARTMENT_OTHER): Payer: 59 | Attending: Internal Medicine

## 2017-04-10 DIAGNOSIS — L97312 Non-pressure chronic ulcer of right ankle with fat layer exposed: Secondary | ICD-10-CM | POA: Insufficient documentation

## 2017-04-10 DIAGNOSIS — L97322 Non-pressure chronic ulcer of left ankle with fat layer exposed: Secondary | ICD-10-CM | POA: Insufficient documentation

## 2017-04-10 DIAGNOSIS — I83223 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 and inflammation: Secondary | ICD-10-CM | POA: Diagnosis present

## 2017-04-12 LAB — COMPLIANCE DRUG ANALYSIS, UR

## 2017-04-17 LAB — COMP. METABOLIC PANEL (12)
ALBUMIN: 4.3 g/dL (ref 3.5–5.5)
ALK PHOS: 46 IU/L (ref 39–117)
AST: 22 IU/L (ref 0–40)
Albumin/Globulin Ratio: 1.8 (ref 1.2–2.2)
BUN/Creatinine Ratio: 10 (ref 9–23)
BUN: 7 mg/dL (ref 6–24)
Bilirubin Total: 0.4 mg/dL (ref 0.0–1.2)
CALCIUM: 9 mg/dL (ref 8.7–10.2)
CREATININE: 0.67 mg/dL (ref 0.57–1.00)
Chloride: 104 mmol/L (ref 96–106)
GFR, EST AFRICAN AMERICAN: 111 mL/min/{1.73_m2} (ref >59)
GFR, EST NON AFRICAN AMERICAN: 97 mL/min/{1.73_m2} (ref >59)
GLUCOSE: 89 mg/dL (ref 65–99)
Globulin, Total: 2.4 g/dL (ref 1.5–4.5)
Potassium: 4.4 mmol/L (ref 3.5–5.2)
SODIUM: 143 mmol/L (ref 134–144)
TOTAL PROTEIN: 6.7 g/dL (ref 6.0–8.5)

## 2017-04-17 LAB — 25-HYDROXY VITAMIN D LCMS D2+D3
25-Hydroxy, Vitamin D-2: <1 ng/mL
25-Hydroxy, Vitamin D-3: 34 ng/mL

## 2017-04-17 LAB — SEDIMENTATION RATE: Sed Rate: 4 mm/hr (ref 0–40)

## 2017-04-17 LAB — MAGNESIUM: Magnesium: 2.3 mg/dL (ref 1.6–2.3)

## 2017-04-17 LAB — VITAMIN B12: Vitamin B-12: 511 pg/mL (ref 232–1245)

## 2017-04-17 LAB — 25-HYDROXYVITAMIN D LCMS D2+D3: 25-HYDROXY, VITAMIN D: 35 ng/mL

## 2017-04-17 LAB — C-REACTIVE PROTEIN: CRP: 5.5 mg/L — AB (ref 0.0–4.9)

## 2017-04-18 IMAGING — US US EXTREM LOW VENOUS BILAT
1 series · 12 of 24 positions shown · non-contrast
Comparison: None.

CLINICAL DATA: 59-year-old female with a history of chronic venous
insufficiency and bilateral lower extremity venous ulcers

EXAM:
BILATERAL LOWER EXTREMITY VENOUS DUPLEX ULTRASOUND
TECHNIQUE: Gray-scale sonography with graded compression, as well as color
Doppler and duplex ultrasound, were performed to evaluate the deep
and superficial veins of both lower extremities. Spectral Doppler
was utilized to evaluate flow at rest and with distal augmentation
maneuvers. A complete superficial venous insufficiency exam was
performed in the upright standing position. I personally performed
the technical portion of the exam.

[Series 1: us extrem low venous bilat · 0.08mm/px · 12 of 57 slices shown]
[im 3/57]
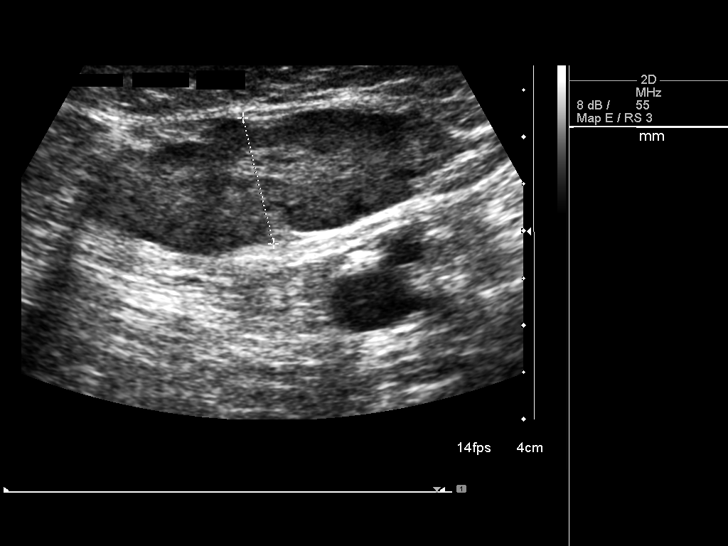
[im 8/57]
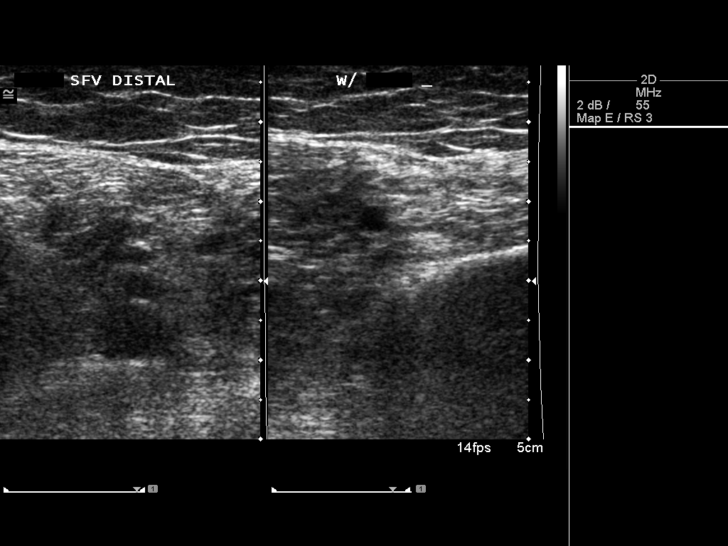
[im 13/57]
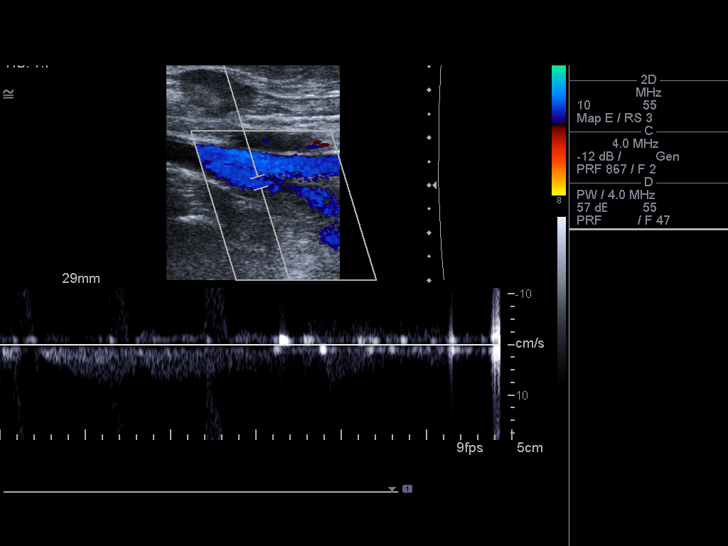
[im 18/57]
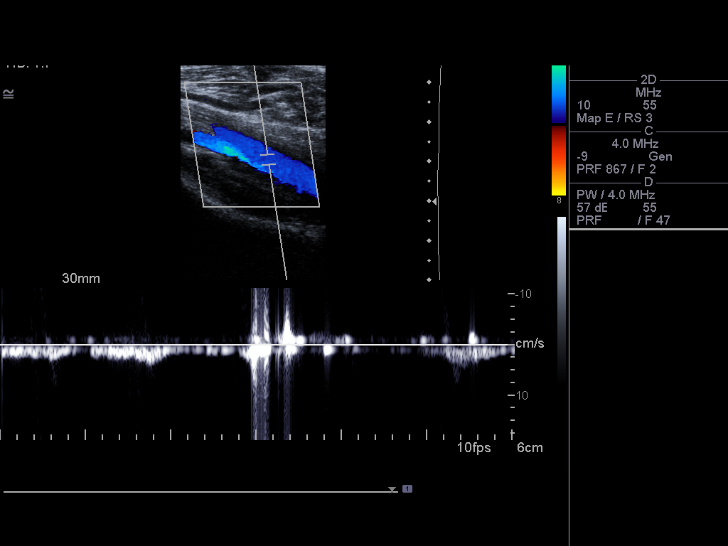
[im 22/57]
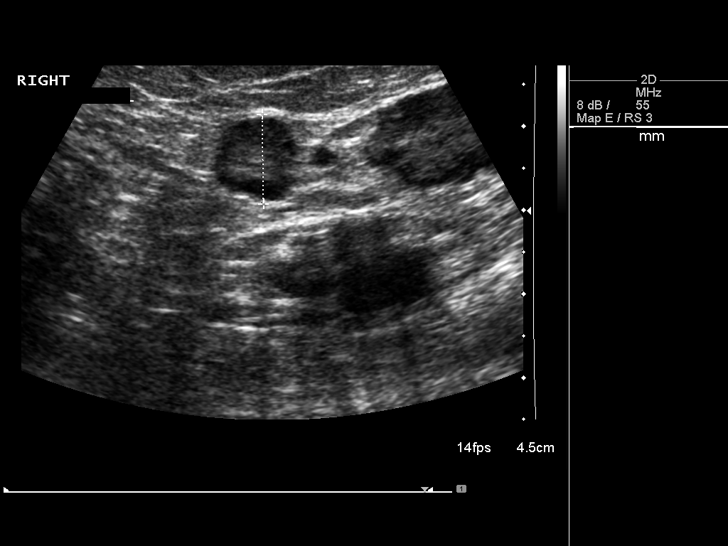
[im 27/57]
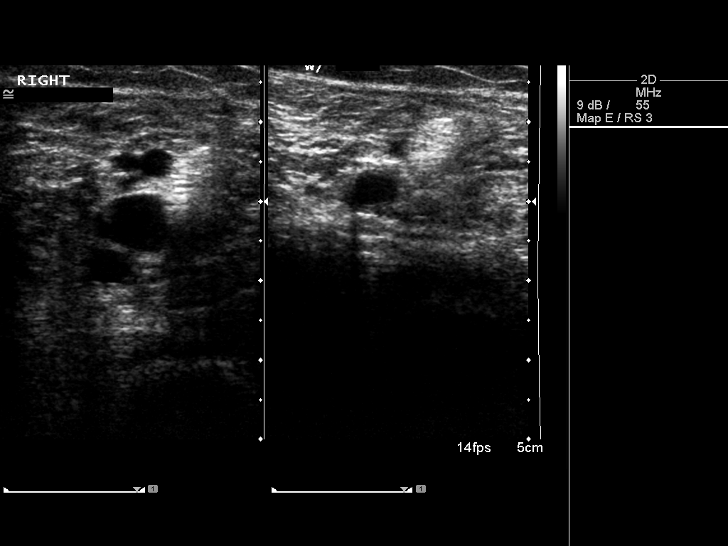
[im 32/57]
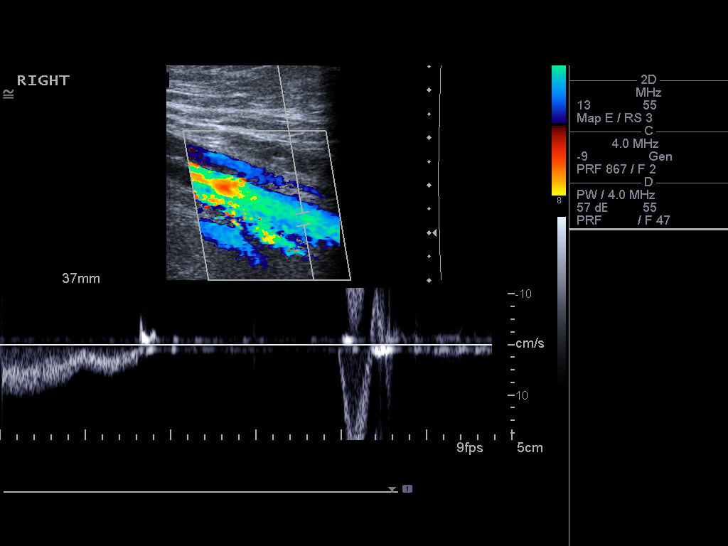
[im 37/57]
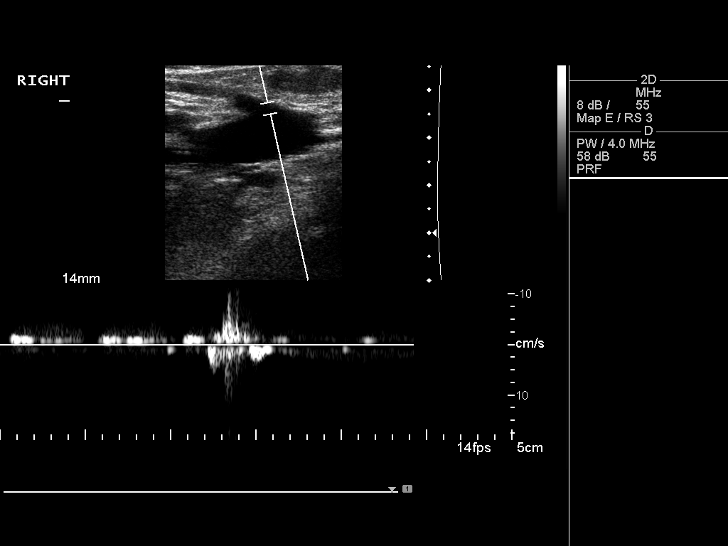
[im 42/57]
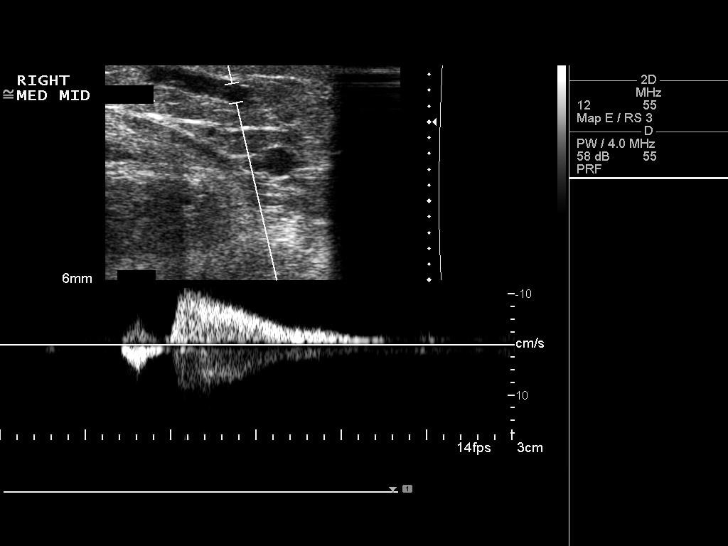
[im 47/57]
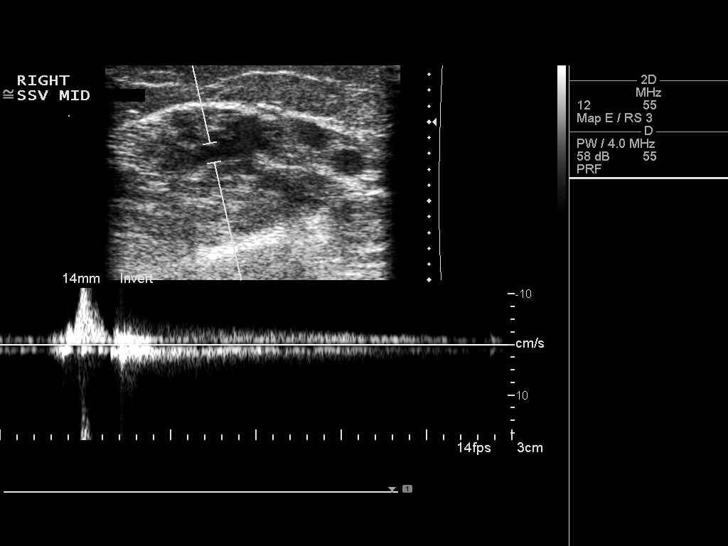
[im 52/57]
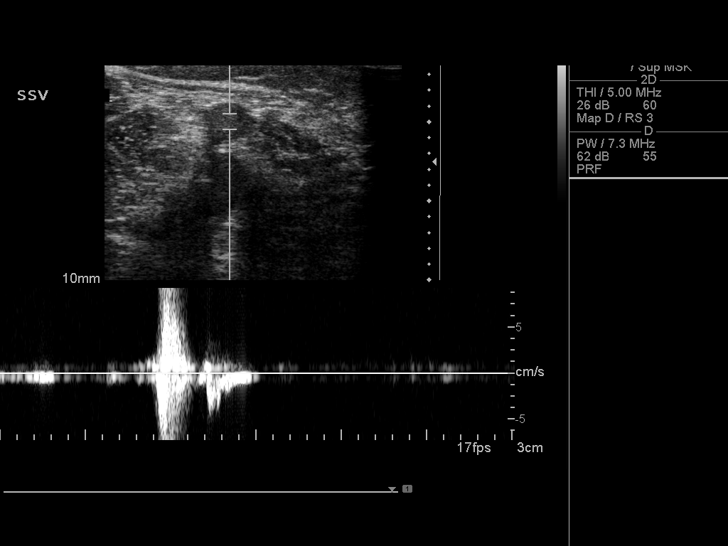
[im 57/57]
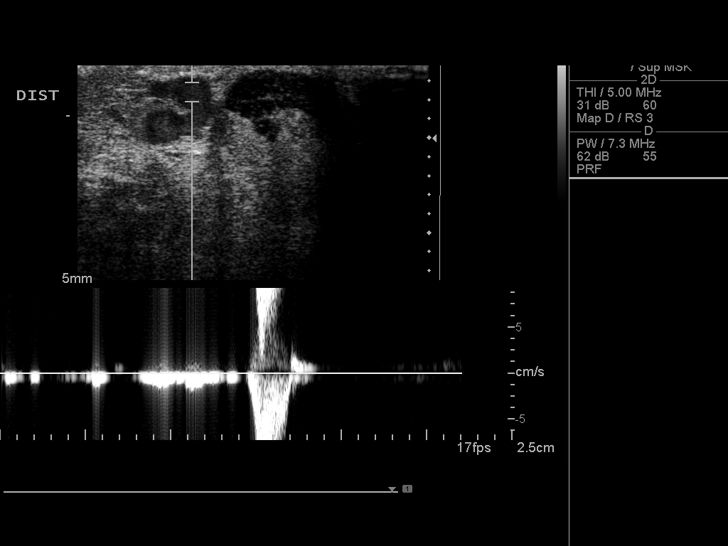

[12 of 24 positions shown; findings below may reference images not displayed]

FINDINGS: Right Deep Venous System:

Evaluation of the deep venous system including the common femoral,
femoral, profunda femoral, popliteal and calf veins (where visible)
demonstrate no evidence of deep venous thrombosis. The vessels are
compressible and demonstrate normal respiratory phasicity and
response to augmentation. No evidence of the deep venous reflux.

Right Superficial Venous System:

SFJ: Saphenous femoral junction is patent. The inferior epigastric
vein and a small anterior accessory great saphenous vein are patent.
The great saphenous vein itself occludes shortly beyond the origin.

GSV Prox Thigh: Not visualized

GSV Mid Thigh: Not visualized

GSV Lower Thigh: Not visualized

GSV at Knee: Not visualized

GSV Prox Calf: Not visualized

GSV Mid Calf: Not visualized

GSV Distal Calf: Not visualized

SPJ: Within normal limits

SSV Prox: Within normal limits

SSV Mid: Within normal limits

SSV Distal: Within normal limits

Other: There are several abnormal superficial venous varicosities in
medial and posterior calf just above the ankle. Additionally there
is an incompetent perforator just above the venous ulcer.

Left Deep Venous System:

Evaluation of the deep venous system including the common femoral,
femoral, profunda femoral, popliteal and calf veins (where visible)
demonstrate no evidence of deep venous thrombosis. The vessels are
compressible and demonstrate normal respiratory phasicity and
response to augmentation. Positive for deep venous reflux in the
posterior tibial veins at the ankle.

Left Superficial Venous System:

SFJ:  Patent.

GSV Prox Thigh:  Surgically absent

GSV Mid Thigh: Surgically absent

GSV Lower Thigh: Surgically absent

GSV at Knee: Surgically absent

GSV Prox Calf: Surgically absent

GSV Mid Calf: Surgically absent

GSV Distal Calf: Surgically absent

SPJ: Patent

SSV Prox: Within normal limits

SSV Mid: Within normal limits

SSV Distal: Within normal limits

Other:  No significant incompetent varices or perforators.
IMPRESSION: 1. No evidence of deep venous thrombosis.
2. Positive for deep venous insufficiency in the posterior tibial
veins at the left ankle.
3. Positive for superficial venous insufficiency in a solitary
perforator vein and several superficial venous varicosities in the
medial and posterior aspect of the mid to lower right calf and right
ankle. These vessels would be amenable to percutaneous
sclerotherapy.

## 2017-04-26 DIAGNOSIS — G8929 Other chronic pain: Secondary | ICD-10-CM | POA: Insufficient documentation

## 2017-04-26 DIAGNOSIS — M25561 Pain in right knee: Secondary | ICD-10-CM

## 2017-04-26 DIAGNOSIS — G894 Chronic pain syndrome: Secondary | ICD-10-CM | POA: Insufficient documentation

## 2017-04-26 DIAGNOSIS — M25562 Pain in left knee: Secondary | ICD-10-CM

## 2017-04-26 NOTE — Progress Notes (Signed)
Patient's Name: [name]  MRN: [id number]  Referring Provider: [name], PA-C  DOB: Feb 17, 1956  PCP: [name], PA-C  DOS: 04/27/2017  Note by: Gaspar Cola, MD  Service setting: Ambulatory outpatient  Specialty: Interventional Pain Management  Location: [facility] (AMB) Pain Management Facility    Patient type: Established   Primary Reason(s) for Visit: Encounter for evaluation before starting new chronic pain management plan of care (Level of risk: moderate) CC: Ankle Pain (bilateral)  HPI  [name] is a 61 y.o. year old, female patient, who comes today for a follow-up evaluation to review the test results and decide on a treatment plan. She has Venous insufficiency; Open wound of knee, leg, and ankle; Status post split thickness skin graft; Chronic lower extremity pain (Secondary Area of Pain) (Bilateral) (L>R); Chronic ankle pain (Primary Area of Pain) (Bilateral) (L>R); Disorder of skeletal system; Long term current use of opiate analgesic; Chronic pain syndrome; Chronic knee pain (Tertiary Area of Pain) (Bilateral) (L>R); Opiate use; Pharmacologic therapy; Problems influencing health status; Chronic Venous stasis ulcers of lower extremities (Bilateral) (L>R); Anxiety due to invasive procedure; and Chronic low back pain on their problem list. Her primarily concern today is the Ankle Pain (bilateral)  Pain Assessment: Location: Right, Left Ankle Radiating: left knee is worse but right hurts also Onset: More than a month ago Duration: Chronic pain Quality: Aching, Constant, Pounding, Nagging Severity: 7 /10 (self-reported pain score)  Note: Reported level is inconsistent with clinical observations. Clinically the patient looks like a 2/10 A 2/10 is viewed as "Mild to Moderate" and described as noticeable and distracting. Impossible to hide from other people. More frequent flare-ups. Still possible to adapt and function close to normal. It can be very annoying and may have  occasional stronger flare-ups. With discipline, patients may get used to it and adapt. Information on the proper use of the pain scale provided to the patient today. When using our objective Pain Scale, levels between 6 and 10/10 are said to belong in an emergency room, as it progressively worsens from a 6/10, described as severely limiting, requiring emergency care not usually available at an outpatient pain management facility. At a 6/10 level, communication becomes difficult and requires great effort. Assistance to reach the emergency department may be required. Facial flushing and profuse sweating along with potentially dangerous increases in heart rate and blood pressure will be evident. Timing: Constant Modifying factors: medications, wound care  [name] comes in today for a follow-up visit after her initial evaluation on 04/07/2017. Today we went over the results of her tests. These were explained in "Layman's terms". During today's appointment we went over my diagnostic impression, as well as the proposed treatment plan.  According to the patient her primary area of pain is in her ankles. She admits this is secondary to open ulcers. She admits that she has venous stasis for 10 years. She admits the last 2 years these areas will not heal. She is s/p bone debridement and skin graft by Dr. Marla Roe. She is currently being seen by Dr. Quentin Cornwall at the wound care clinic. She admits that she has not tried hyperbaric chamber. She does perform daily dressings with Ace wrap and/or compression wrap. She has increased stiffness in her ankles. She has numbness, tingling along with sharp shooting pain. She admits that this keeps her up at night.  She admits that she has bilateral knee pain. She states that left is greater than the right. She denies any previous surgeries,  interventional therapies physical therapy or recent images.  In considering the treatment plan options, [name] was reminded that I no  longer take patients for medication management only. I asked her to let me know if she had no intention of taking advantage of the interventional therapies, so that we could make arrangements to provide this space to someone interested. I also made it clear that undergoing interventional therapies for the purpose of getting pain medications is very inappropriate on the part of a patient, and it will not be tolerated in this practice. This type of behavior would suggest true addiction and therefore it requires referral to an addiction specialist.   Further details on both, my assessment(s), as well as the proposed treatment plan, please see below.  Controlled Substance Pharmacotherapy Assessment REMS (Risk Evaluation and Mitigation Strategy)  Analgesic: Hydrocodone/acetaminophen 5/325 mg 1 tablet every 6 hours (last fill date 04/03/2017) hydrocodone 20 mg per day Highest recorded MME/day: 60 mg/day MME/day: 20 mg/day Pill Count: None expected due to no prior prescriptions written by our practice. [name], RN  04/27/2017  9:53 AM  Sign at close encounter Safety precautions to be maintained throughout the outpatient stay will include: orient to surroundings, keep bed in low position, maintain call bell within reach at all times, provide assistance with transfer out of bed and ambulation.    Pharmacokinetics: Liberation and absorption (onset of action): WNL Distribution (time to peak effect): WNL Metabolism and excretion (duration of action): WNL         Pharmacodynamics: Desired effects: Analgesia: [name] reports >50% benefit. Functional ability: Patient reports that medication allows her to accomplish basic ADLs Clinically meaningful improvement in function (CMIF): Sustained CMIF goals met Perceived effectiveness: Described as relatively effective, allowing for increase in activities of daily living (ADL) Undesirable effects: Side-effects or Adverse reactions: None  reported Monitoring: [place] PMP: Online review of the past 75-monthperiod previously conducted. Not applicable at this point since we have not taken over the patient's medication management yet. List of other Serum/Urine Drug Screening Test(s):  No results found for: AMPHSCRSER, BARBSCRSER, BENZOSCRSER, COCAINSCRSER, COCAINSCRNUR, PCPSCRSER, THCSCRSER, THCU, CANNABQUANT, OSan [place] of all UDS test(s) done:  Lab Results  Component Value Date   SUMMARY FINAL 04/07/2017   Last UDS on record: Summary  Date Value Ref Range Status  04/07/2017 FINAL  Final    Comment:    ==================================================================== TOXASSURE COMP DRUG ANALYSIS,UR ==================================================================== Test                             Result       Flag       Units Drug Present and Declared for Prescription Verification   Hydrocodone                    1003         EXPECTED   ng/mg creat   Hydromorphone                  486          EXPECTED   ng/mg creat   Norhydrocodone                 1283         EXPECTED   ng/mg creat    Sources of hydrocodone include scheduled prescription    medications. Hydromorphone and norhydrocodone are expected    metabolites of hydrocodone. Hydromorphone  is also available as a    scheduled prescription medication.   Acetaminophen                  PRESENT      EXPECTED Drug Present not Declared for Prescription Verification   Lidocaine                      PRESENT      UNEXPECTED ==================================================================== Test                      Result    Flag   Units      Ref Range   Creatinine              35               mg/dL      >=20 ==================================================================== Declared Medications:  The flagging and interpretation on this report are based on the  following declared medications.  Unexpected results may arise from   inaccuracies in the declared medications.  **Note: The testing scope of this panel includes these medications:  Hydrocodone (Hydrocodone-Acetaminophen)  **Note: The testing scope of this panel does not include small to  moderate amounts of these reported medications:  Acetaminophen (Hydrocodone-Acetaminophen)  **Note: The testing scope of this panel does not include following  reported medications:  Collagenase (Santyl)  Multivitamin (MVI)  Omega-3 Fatty Acids  Vitamin C ==================================================================== For clinical consultation, please call [phone]. ====================================================================    UDS interpretation: Unexpected findings not considered significantly abnormal.          Medication Assessment Form: Patient introduced to form today Treatment compliance: Treatment may start today if patient agrees with proposed plan. Evaluation of compliance is not applicable at this point Risk Assessment Profile: Aberrant behavior: See initial evaluations. None observed or detected today Comorbid factors increasing risk of overdose: See initial evaluation. No additional risks detected today Medical Psychology Evaluation: Please see scanned results in medical record. Opioid Risk Tool - 04/27/17 0952      Family History of Substance Abuse   Alcohol  Negative    Illegal Drugs  Negative    Rx Drugs  Negative      Personal History of Substance Abuse   Alcohol  Negative    Illegal Drugs  Negative    Rx Drugs  Negative      Age   Age between 74-45 years   No      History of Preadolescent Sexual Abuse   History of Preadolescent Sexual Abuse  Negative or Female      Psychological Disease   Psychological Disease  Negative    Depression  Negative      Total Score   Opioid Risk Tool Scoring  0    Opioid Risk Interpretation  Low Risk      ORT Scoring interpretation table:  Score <3 = Low Risk for SUD  Score between  4-7 = Moderate Risk for SUD  Score >8 = High Risk for Opioid Abuse   Risk Mitigation Strategies:  Patient opioid safety counseling: Completed today. Counseling provided to patient as per "Patient Counseling Document". Document signed by patient, attesting to counseling and understanding Patient-Prescriber Agreement (PPA): Obtained today.  Controlled substance notification to other providers: Written and sent today.  Pharmacologic Plan: Ms. Ralls has indicated not being interested in having [place] take over her medications. From this point on, Ms. [name] medication agreement should be considered active.  Laboratory  Chemistry  Inflammation Markers (CRP: Acute Phase) (ESR: Chronic Phase) Lab Results  Component Value Date   CRP 5.5 (H) 04/07/2017   ESRSEDRATE 4 04/07/2017                  Renal Function Markers Lab Results  Component Value Date   BUN 7 04/07/2017   CREATININE 0.67 04/07/2017   GFRAA 111 04/07/2017   GFRNONAA 97 04/07/2017                 Hepatic Function Markers Lab Results  Component Value Date   AST 22 04/07/2017   ALBUMIN 4.3 04/07/2017   ALKPHOS 46 04/07/2017                 Electrolytes Lab Results  Component Value Date   NA 143 04/07/2017   K 4.4 04/07/2017   CL 104 04/07/2017   CALCIUM 9.0 04/07/2017   MG 2.3 04/07/2017                 Neuropathy Markers Lab Results  Component Value Date   VITAMINB12 511 04/07/2017                 Bone Pathology Markers Lab Results  Component Value Date   ALKPHOS 46 04/07/2017   25OHVITD1 35 04/07/2017   25OHVITD2 <1.0 04/07/2017   25OHVITD3 34 04/07/2017   CALCIUM 9.0 04/07/2017                 Rheumatology Markers No results found for: LABURIC, URICUR              Coagulation Parameters No results found for: INR, LABPROT, APTT, PLT, DDIMER               Cardiovascular Markers Lab Results  Component Value Date   HGB 13.3 10/15/2015   HCT 39.0 10/15/2015                 CA Markers No results  found for: CEA, CA125, LABCA2               Note: Lab results reviewed.  Recent Diagnostic Imaging Review  Knee Imaging: Knee-R DG 1-2 views:  Results for orders placed during the hospital encounter of 04/07/17  DG Knee 1-2 Views Right   Narrative CLINICAL DATA:  Chronic bilateral ankle and knee pain  EXAM: RIGHT KNEE - 1-2 VIEW  COMPARISON:  None.  FINDINGS: No fracture or dislocation is seen.  The joint spaces are preserved.  Visualized soft tissues are within normal limits.  No suprapatellar knee joint effusion.  IMPRESSION: Negative.   Electronically Signed   By: Julian Hy M.D.   On: 04/07/2017 20:55    Knee-L DG 1-2 views:  Results for orders placed during the hospital encounter of 04/07/17  DG Knee 1-2 Views Left   Narrative CLINICAL DATA:  Chronic bilateral ankle and knee pain  EXAM: LEFT KNEE - 1-2 VIEW  COMPARISON:  None.  FINDINGS: No fracture or dislocation is seen.  The joint spaces are preserved.  Visualized soft tissues are within normal limits.  No suprapatellar knee joint effusion.  IMPRESSION: Negative.   Electronically Signed   By: Julian Hy M.D.   On: 04/07/2017 20:55    Ankle Imaging: Ankle-R DG Complete:  Results for orders placed during the hospital encounter of 04/07/17  DG Ankle Complete Right   Narrative CLINICAL DATA:  Chronic bilateral ankle and knee pain, nonhealing wound along the medial ankles bilaterally  EXAM: RIGHT ANKLE - COMPLETE 3+ VIEW  COMPARISON:  None.  FINDINGS: No fracture or dislocation is seen.  The ankle mortise is intact.  The base of the fifth metatarsal is unremarkable.  Visualized soft tissues are grossly unremarkable.  No radiopaque foreign body is seen.  IMPRESSION: Negative.   Electronically Signed   By: Julian Hy M.D.   On: 04/07/2017 20:56    Ankle-L DG Complete:  Results for orders placed during the hospital encounter of 04/07/17  DG Ankle  Complete Left   Narrative CLINICAL DATA:  Chronic bilateral ankle and knee pain, nonhealing wound along the medial ankles bilaterally  EXAM: LEFT ANKLE COMPLETE - 3+ VIEW  COMPARISON:  None.  FINDINGS: No fracture or dislocation is seen.  The ankle mortise is intact.  The base of the fifth metatarsal is unremarkable.  Mild soft tissue irregularity overlying the medial malleolus with overlying bandage.  No radiopaque foreign body is seen.  IMPRESSION: Mild soft tissue irregularity overlying the medial malleolus.  No fracture, dislocation, or radiopaque foreign body is seen.   Electronically Signed   By: Julian Hy M.D.   On: 04/07/2017 20:57    Foot Imaging: Foot-L DG Complete:  Results for orders placed during the hospital encounter of 09/28/15  DG Foot Complete Left   Narrative CLINICAL DATA:  Nonhealing wound left ankle  EXAM: LEFT FOOT - COMPLETE 3+ VIEW  COMPARISON:  None.  FINDINGS: Three views of the left foot submitted. No acute fracture or subluxation. There is diffuse osteopenia. Study is limited by bandage material artifact. No evidence of bone destruction to suggest osteomyelitis. No periosteal reaction or bony erosion.  IMPRESSION: No acute fracture or subluxation.  No evidence of osteomyelitis.   Electronically Signed   By: Lahoma Crocker M.D.   On: 09/28/2015 13:11    Complexity Note: Imaging results reviewed. Results shared with Ms. Lehane, using Layman's terms.                         Meds   Current Outpatient Medications:  Marland Kitchen  Multiple Vitamin (MULTIVITAMIN) capsule, Take 1 capsule by mouth daily., Disp: , Rfl:  .  Omega-3 Fatty Acids (FISH OIL) 1000 MG CAPS, Take by mouth., Disp: , Rfl:  .  oxyCODONE (OXY IR/ROXICODONE) 5 MG immediate release tablet, Take 1 tablet (5 mg total) by mouth every 6 (six) hours as needed for severe pain., Disp: 120 tablet, Rfl: 0 .  SANTYL ointment, , Disp: , Rfl:  .  vitamin C (ASCORBIC ACID) 500 MG  tablet, Take 500 mg by mouth daily., Disp: , Rfl:  .  diazepam (VALIUM) 5 MG tablet, Take 1 tablet (5 mg total) by mouth as needed for up to 2 doses for anxiety (Take one tab 45 minutes before MRI. Take second tablet just prior to MRI scan). Do not take medication within 4 hours of taking opioid pain medications. Must have a driver. Do not drive or operate machinery x 24 hours after taking this medication., Disp: 2 tablet, Rfl: 0  ROS  Constitutional: Denies any fever or chills Gastrointestinal: No reported hemesis, hematochezia, vomiting, or acute GI distress Musculoskeletal: Denies any acute onset joint swelling, redness, loss of ROM, or weakness Neurological: No reported episodes of acute onset apraxia, aphasia, dysarthria, agnosia, amnesia, paralysis, loss of coordination, or loss of consciousness  Allergies  Ms. Chiasson is allergic to penicillins.  [place]  Drug: Ms. Craker  reports that she does not use  drugs. Alcohol:  reports that she drinks alcohol. Tobacco:  reports that she quit smoking about 10 years ago. Her smoking use included cigarettes. She has a 6.50 pack-year smoking history. she has never used smokeless tobacco. Medical:  has a past medical history of Allergy, Ankle wound (02/2016), Dental bridge present, Dental crowns present, Open wounds involving multiple regions of lower extremity, Peripheral vascular disease ([place]), and PONV (postoperative nausea and vomiting). Surgical: Ms. Rishel  has a past surgical history that includes Ectopic pregnancy surgery (1996); Vein ligation and stripping (Left, late 1990's); Cardiac catheterization (N/A, 10/15/2015); I&D extremity (Bilateral, 12/12/2015); Application if wound vac (Bilateral, 12/12/2015); I&D extremity (Bilateral, 01/07/2016); Tonsillectomy; Wound debridement (Left, 02/14/2016); Dressing change under anesthesia (Right, 02/14/2016); and skin graph. Family: family history includes Diabetes in her maternal grandmother and paternal grandmother;  Healthy in her brother; Hypertension in her mother; Lung cancer in her father.  Constitutional Exam  General appearance: Well nourished, well developed, and well hydrated. In no apparent acute distress Vitals:   04/27/17 0946  BP: (!) 92/30  Pulse: 76  Resp: 18  Temp: 98.1 F (36.7 C)  SpO2: 100%  Weight: 120 lb (54.4 kg)  Height: 5' 8"  (1.727 m)   BMI Assessment: Estimated body mass index is 18.25 kg/m as calculated from the following:   Height as of this encounter: 5' 8"  (1.727 m).   Weight as of this encounter: 120 lb (54.4 kg).  BMI interpretation table: BMI level Category Range association with higher incidence of chronic pain  <18 kg/m2 Underweight   18.5-24.9 kg/m2 Ideal body weight   25-29.9 kg/m2 Overweight Increased incidence by 20%  30-34.9 kg/m2 Obese (Class I) Increased incidence by 68%  35-39.9 kg/m2 Severe obesity (Class II) Increased incidence by 136%  >40 kg/m2 Extreme obesity (Class III) Increased incidence by 254%   BMI Readings from Last 4 Encounters:  04/27/17 18.25 kg/m  04/07/17 19.16 kg/m  10/30/16 19.33 kg/m  02/14/16 17.85 kg/m   Wt Readings from Last 4 Encounters:  04/27/17 120 lb (54.4 kg)  04/07/17 126 lb (57.2 kg)  10/30/16 127 lb 2 oz (57.7 kg)  02/14/16 117 lb 6.4 oz (53.3 kg)  Psych/Mental status: Alert, oriented x 3 (person, place, & time)       Eyes: PERLA Respiratory: No evidence of acute respiratory distress  Cervical Spine Area Exam  Skin & Axial Inspection: No masses, redness, edema, swelling, or associated skin lesions Alignment: Symmetrical Functional ROM: Unrestricted ROM      Stability: No instability detected Muscle Tone/Strength: Functionally intact. No obvious neuro-muscular anomalies detected. Sensory (Neurological): Unimpaired Palpation: No palpable anomalies              Upper Extremity (UE) Exam    Side: Right upper extremity  Side: Left upper extremity  Skin & Extremity Inspection: Skin color, temperature,  and hair growth are WNL. No peripheral edema or cyanosis. No masses, redness, swelling, asymmetry, or associated skin lesions. No contractures.  Skin & Extremity Inspection: Skin color, temperature, and hair growth are WNL. No peripheral edema or cyanosis. No masses, redness, swelling, asymmetry, or associated skin lesions. No contractures.  Functional ROM: Unrestricted ROM          Functional ROM: Unrestricted ROM          Muscle Tone/Strength: Functionally intact. No obvious neuro-muscular anomalies detected.  Muscle Tone/Strength: Functionally intact. No obvious neuro-muscular anomalies detected.  Sensory (Neurological): Unimpaired          Sensory (Neurological): Unimpaired  Palpation: No palpable anomalies              Palpation: No palpable anomalies              Specialized Test(s): Deferred         Specialized Test(s): Deferred          Thoracic Spine Area Exam  Skin & Axial Inspection: No masses, redness, or swelling Alignment: Symmetrical Functional ROM: Unrestricted ROM Stability: No instability detected Muscle Tone/Strength: Functionally intact. No obvious neuro-muscular anomalies detected. Sensory (Neurological): Unimpaired Muscle strength & Tone: No palpable anomalies  Lumbar Spine Area Exam  Skin & Axial Inspection: No masses, redness, or swelling Alignment: Symmetrical Functional ROM: Unrestricted ROM      Stability: No instability detected Muscle Tone/Strength: Functionally intact. No obvious neuro-muscular anomalies detected. Sensory (Neurological): Unimpaired Palpation: No palpable anomalies       Provocative Tests: Lumbar Hyperextension and rotation test: evaluation deferred today       Lumbar Lateral bending test: evaluation deferred today       Patrick's Maneuver: evaluation deferred today                    Gait & Posture Assessment  Ambulation: Unassisted Gait: Relatively normal for age and body habitus Posture: WNL   Lower Extremity Exam    Side:  Right lower extremity  Side: Left lower extremity  Skin & Extremity Inspection: Distal lower extremity stasis ulcers currently covered with clean bandages  Skin & Extremity Inspection: Distal lower extremity stasis ulcers currently covered with clean bandages  Functional ROM: Unrestricted ROM          Functional ROM: Unrestricted ROM          Muscle Tone/Strength: Functionally intact. No obvious neuro-muscular anomalies detected.  Muscle Tone/Strength: Functionally intact. No obvious neuro-muscular anomalies detected.  Sensory (Neurological): Unimpaired  Sensory (Neurological): Unimpaired  Palpation: Tender  Palpation: Tender   Assessment & Plan  Primary Diagnosis & Pertinent Problem List: The primary encounter diagnosis was Chronic pain syndrome. Diagnoses of Chronic ankle pain (Primary Area of Pain) (Bilateral) (L>R), Chronic lower extremity pain (Secondary Area of Pain) (Bilateral) (L>R), Chronic knee pain (Tertiary Area of Pain) (Bilateral) (L>R), Chronic Venous stasis ulcers of lower extremities (Bilateral), Chronic low back pain, Opiate use, Anxiety due to invasive procedure, and Pharmacologic therapy were also pertinent to this visit.  Visit Diagnosis: 1. Chronic pain syndrome   2. Chronic ankle pain (Primary Area of Pain) (Bilateral) (L>R)   3. Chronic lower extremity pain (Secondary Area of Pain) (Bilateral) (L>R)   4. Chronic knee pain (Tertiary Area of Pain) (Bilateral) (L>R)   5. Chronic Venous stasis ulcers of lower extremities (Bilateral)   6. Chronic low back pain   7. Opiate use   8. Anxiety due to invasive procedure   9. Pharmacologic therapy    Problems updated and reviewed during this visit: Problem  Chronic Venous stasis ulcers of lower extremities (Bilateral) (L>R)    Time Note: Greater than 50% of the 40 minute(s) of face-to-face time spent with Ms. Bjelland, was spent in counseling/coordination of care regarding: the appropriate use of the pain scale, opioid tolerance,  Ms. Delmar primary cause of pain, the results of her recent test(s), the significance of each one oth the test(s) anomalies and it's corresponding characteristic pain pattern(s), the treatment plan, treatment alternatives, the risks and possible complications of proposed treatment, medication side effects, the opioid analgesic risks and possible complications, realistic expectations, the goals of  pain management (increased in functionality), the medication agreement and the need to collect and read the AVS material.  Plan of Care  Pharmacotherapy (Medications Ordered): Meds ordered this encounter  Medications  . oxyCODONE (OXY IR/ROXICODONE) 5 MG immediate release tablet    Sig: Take 1 tablet (5 mg total) by mouth every 6 (six) hours as needed for severe pain.    Dispense:  120 tablet    Refill:  0    Do not place this medication, or any other prescription from our practice, on "Automatic Refill". Patient may have prescription filled one day early if pharmacy is closed on scheduled refill date. Do not fill until: 04/27/17 To last until: 05/27/17  . diazepam (VALIUM) 5 MG tablet    Sig: Take 1 tablet (5 mg total) by mouth as needed for up to 2 doses for anxiety (Take one tab 45 minutes before MRI. Take second tablet just prior to MRI scan). Do not take medication within 4 hours of taking opioid pain medications. Must have a driver. Do not drive or operate machinery x 24 hours after taking this medication.    Dispense:  2 tablet    Refill:  0    Must have a driver. Do not drive or operate machinery x 24 hours after taking this medication. Instruct the patient to avoid taking opioids within 6 hours of having consumed the benzodiazepine and also to avoid the benzodiazepine within 6 hours of having taken any opioids.   Procedure Orders    No procedure(s) ordered today   Lab Orders  No laboratory test(s) ordered today    Imaging Orders     MR LUMBAR SPINE WO CONTRAST Referral Orders  No  referral(s) requested today    Pharmacological management options:  Opioid Analgesics: We'll take over management today. See above orders Membrane stabilizer: We have discussed the possibility of optimizing this mode of therapy, if tolerated Muscle relaxant: We have discussed the possibility of a trial NSAID: We have discussed the possibility of a trial Other analgesic(s): To be determined at a later time   Interventional management options: Planned, scheduled, and/or pending:    Today we will start the patient on oxycodone IR 5 mg every 6 hours when necessary for pain and we will discontinue the hydrocodone/APAP.  This will probably be followed by diagnostic bilateral lumbar facet blocks to determine if increased blood flow to the lower extremity can help heal these ulcers. The patient may be a candidate for a bilateral lumbar sympathetic RFA to sustain this increased blood flow. Another alternative to accomplish the same would be to implant a spinal cord stimulator. This type of devices have been shown to increase blood flow to the lower extremities. There is sufficient evidence in the [place] literature to sustain this indication.    Considering:   Diagnostic bilateral lumbar sympathetic block (LSB)  Possible bilateral lumbar sympathetic RFA  Possible bilateral  Lumbar spinal stimulator trial to improve blood flow    PRN Procedures:   None at this time   Provider-requested follow-up: Return in about 2 weeks (around 05/11/2017) for Med-Mgmt w/ Dr. Dossie Arbour: New med eval..  Future Appointments  Date Time Provider [place]  05/08/2017  2:50 PM GI-315 MR 1 [id number] [facility] [facility]  05/16/2019  1:30 PM Milinda Pointer, MD [facility] None    Primary Care Physician: [name], PA-C Location: [facility] Note by: Gaspar Cola, MD Date: 04/27/2017; Time: 11:35 AM

## 2017-04-27 ENCOUNTER — Ambulatory Visit: Payer: 59 | Attending: Pain Medicine | Admitting: Pain Medicine

## 2017-04-27 ENCOUNTER — Encounter: Payer: Self-pay | Admitting: Pain Medicine

## 2017-04-27 VITALS — BP 92/30 | HR 76 | Temp 98.1°F | Resp 18 | Ht 68.0 in | Wt 120.0 lb

## 2017-04-27 DIAGNOSIS — I83029 Varicose veins of left lower extremity with ulcer of unspecified site: Secondary | ICD-10-CM

## 2017-04-27 DIAGNOSIS — F411 Generalized anxiety disorder: Secondary | ICD-10-CM | POA: Diagnosis not present

## 2017-04-27 DIAGNOSIS — I83019 Varicose veins of right lower extremity with ulcer of unspecified site: Secondary | ICD-10-CM | POA: Diagnosis not present

## 2017-04-27 DIAGNOSIS — M25561 Pain in right knee: Secondary | ICD-10-CM | POA: Insufficient documentation

## 2017-04-27 DIAGNOSIS — M25562 Pain in left knee: Secondary | ICD-10-CM

## 2017-04-27 DIAGNOSIS — Z789 Other specified health status: Secondary | ICD-10-CM | POA: Insufficient documentation

## 2017-04-27 DIAGNOSIS — Z79891 Long term (current) use of opiate analgesic: Secondary | ICD-10-CM | POA: Insufficient documentation

## 2017-04-27 DIAGNOSIS — F064 Anxiety disorder due to known physiological condition: Secondary | ICD-10-CM | POA: Insufficient documentation

## 2017-04-27 DIAGNOSIS — L97919 Non-pressure chronic ulcer of unspecified part of right lower leg with unspecified severity: Secondary | ICD-10-CM

## 2017-04-27 DIAGNOSIS — Z880 Allergy status to penicillin: Secondary | ICD-10-CM | POA: Diagnosis not present

## 2017-04-27 DIAGNOSIS — M79604 Pain in right leg: Secondary | ICD-10-CM | POA: Diagnosis not present

## 2017-04-27 DIAGNOSIS — M79605 Pain in left leg: Secondary | ICD-10-CM | POA: Diagnosis not present

## 2017-04-27 DIAGNOSIS — F419 Anxiety disorder, unspecified: Secondary | ICD-10-CM | POA: Insufficient documentation

## 2017-04-27 DIAGNOSIS — G8929 Other chronic pain: Secondary | ICD-10-CM | POA: Insufficient documentation

## 2017-04-27 DIAGNOSIS — M25572 Pain in left ankle and joints of left foot: Secondary | ICD-10-CM | POA: Diagnosis not present

## 2017-04-27 DIAGNOSIS — Z833 Family history of diabetes mellitus: Secondary | ICD-10-CM | POA: Diagnosis not present

## 2017-04-27 DIAGNOSIS — Z79899 Other long term (current) drug therapy: Secondary | ICD-10-CM | POA: Diagnosis not present

## 2017-04-27 DIAGNOSIS — Z87891 Personal history of nicotine dependence: Secondary | ICD-10-CM | POA: Insufficient documentation

## 2017-04-27 DIAGNOSIS — L97929 Non-pressure chronic ulcer of unspecified part of left lower leg with unspecified severity: Secondary | ICD-10-CM

## 2017-04-27 DIAGNOSIS — M545 Low back pain, unspecified: Secondary | ICD-10-CM

## 2017-04-27 DIAGNOSIS — M25571 Pain in right ankle and joints of right foot: Secondary | ICD-10-CM | POA: Diagnosis not present

## 2017-04-27 DIAGNOSIS — Z8249 Family history of ischemic heart disease and other diseases of the circulatory system: Secondary | ICD-10-CM | POA: Insufficient documentation

## 2017-04-27 DIAGNOSIS — G894 Chronic pain syndrome: Secondary | ICD-10-CM | POA: Insufficient documentation

## 2017-04-27 DIAGNOSIS — Z801 Family history of malignant neoplasm of trachea, bronchus and lung: Secondary | ICD-10-CM | POA: Insufficient documentation

## 2017-04-27 DIAGNOSIS — I872 Venous insufficiency (chronic) (peripheral): Secondary | ICD-10-CM | POA: Insufficient documentation

## 2017-04-27 DIAGNOSIS — F1190 Opioid use, unspecified, uncomplicated: Secondary | ICD-10-CM

## 2017-04-27 DIAGNOSIS — I739 Peripheral vascular disease, unspecified: Secondary | ICD-10-CM | POA: Insufficient documentation

## 2017-04-27 DIAGNOSIS — Z9889 Other specified postprocedural states: Secondary | ICD-10-CM | POA: Insufficient documentation

## 2017-04-27 MED ORDER — OXYCODONE HCL 5 MG PO TABS: 5.0000 mg | ORAL_TABLET | Freq: Four times a day (QID) | ORAL | 0 refills | Status: DC | PRN

## 2017-04-27 MED ORDER — DIAZEPAM 5 MG PO TABS: 5.0000 mg | ORAL_TABLET | ORAL | 0 refills | Status: DC | PRN

## 2017-04-27 NOTE — Patient Instructions (Signed)
____________________________________________________________________________________________  Medication Rules  Applies to: All patients receiving prescriptions (written or electronic).  Pharmacy of record: Pharmacy where electronic prescriptions will be sent. If written prescriptions are taken to a different pharmacy, please inform the nursing staff. The pharmacy listed in the electronic medical record should be the one where you would like electronic prescriptions to be sent.  Prescription refills: Only during scheduled appointments. Applies to both, written and electronic prescriptions.  NOTE: The following applies primarily to controlled substances (Opioid* Pain Medications).   Patient's responsibilities: 1. Pain Pills: Bring all pain pills to every appointment (except for procedure appointments). 2. Pill Bottles: Bring pills in original pharmacy bottle. Always bring newest bottle. Bring bottle, even if empty. 3. Medication refills: You are responsible for knowing and keeping track of what medications you need refilled. The day before your appointment, write a list of all prescriptions that need to be refilled. Bring that list to your appointment and give it to the admitting nurse. Prescriptions will be written only during appointments. If you forget a medication, it will not be "Called in", "Faxed", or "electronically sent". You will need to get another appointment to get these prescribed. 4. Prescription Accuracy: You are responsible for carefully inspecting your prescriptions before leaving our office. Have the discharge nurse carefully go over each prescription with you, before taking them home. Make sure that your name is accurately spelled, that your address is correct. Check the name and dose of your medication to make sure it is accurate. Check the number of pills, and the written instructions to make sure they are clear and accurate. Make sure that you are given enough medication to  last until your next medication refill appointment. 5. Taking Medication: Take medication as prescribed. Never take more pills than instructed. Never take medication more frequently than prescribed. Taking less pills or less frequently is permitted and encouraged, when it comes to controlled substances (written prescriptions).  6. Inform other Doctors: Always inform, all of your healthcare providers, of all the medications you take. 7. Pain Medication from other Providers: You are not allowed to accept any additional pain medication from any other Doctor or Healthcare provider. There are two exceptions to this rule. (see below) In the event that you require additional pain medication, you are responsible for notifying us, as stated below. 8. Medication Agreement: You are responsible for carefully reading and following our Medication Agreement. This must be signed before receiving any prescriptions from our practice. Safely store a copy of your signed Agreement. Violations to the Agreement will result in no further prescriptions. (Additional copies of our Medication Agreement are available upon request.) 9. Laws, Rules, & Regulations: All patients are expected to follow all Federal and State Laws, Statutes, Rules, & Regulations. Ignorance of the Laws does not constitute a valid excuse. The use of any illegal substances is prohibited. 10. Adopted CDC guidelines & recommendations: Target dosing levels will be at or below 60 MME/day. Use of benzodiazepines** is not recommended.  Exceptions: There are only two exceptions to the rule of not receiving pain medications from other Healthcare Providers. 1. Exception #1 (Emergencies): In the event of an emergency (i.e.: accident requiring emergency care), you are allowed to receive additional pain medication. However, you are responsible for: As soon as you are able, call our office [phone], at any time of the day or night, and leave a message stating your  name, the date and nature of the emergency, and the name and dose of the medication   prescribed. In the event that your call is answered by a member of our staff, make sure to document and save the date, time, and the name of the person that took your information.  2. Exception #2 (Planned Surgery): In the event that you are scheduled by another doctor or dentist to have any type of surgery or procedure, you are allowed (for a period no longer than 30 days), to receive additional pain medication, for the acute post-op pain. However, in this case, you are responsible for picking up a copy of our "Post-op Pain Management for Surgeons" handout, and giving it to your surgeon or dentist. This document is available at our office, and does not require an appointment to obtain it. Simply go to our office during business hours (Monday-Thursday from 8:00 AM to 4:00 PM) (Friday 8:00 AM to 12:00 Noon) or if you have a scheduled appointment with us, prior to your surgery, and ask for it by name. In addition, you will need to provide us with your name, name of your surgeon, type of surgery, and date of procedure or surgery.  *Opioid medications include: morphine, codeine, oxycodone, oxymorphone, hydrocodone, hydromorphone, meperidine, tramadol, tapentadol, buprenorphine, fentanyl, methadone. **Benzodiazepine medications include: diazepam (Valium), alprazolam (Xanax), clonazepam (Klonopine), lorazepam (Ativan), clorazepate (Tranxene), chlordiazepoxide (Librium), estazolam (Prosom), oxazepam (Serax), temazepam (Restoril), triazolam (Halcion)  ____________________________________________________________________________________________  ____________________________________________________________________________________________  Pain Scale  Introduction: The pain score used by this practice is the Verbal Numerical Rating Scale (VNRS-11). This is an 11-point scale. It is for adults and children 10 years or older. There are  significant differences in how the pain score is reported, used, and applied. Forget everything you learned in the past and learn this scoring system.  General Information: The scale should reflect your current level of pain. Unless you are specifically asked for the level of your worst pain, or your average pain. If you are asked for one of these two, then it should be understood that it is over the past 24 hours.  Basic Activities of Daily Living (ADL): Personal hygiene, dressing, eating, transferring, and using restroom.  Instructions: Most patients tend to report their level of pain as a combination of two factors, their physical pain and their psychosocial pain. This last one is also known as "suffering" and it is reflection of how physical pain affects you socially and psychologically. From now on, report them separately. From this point on, when asked to report your pain level, report only your physical pain. Use the following table for reference.  Pain Clinic Pain Levels (0-5/10)  Pain Level Score  Description  No Pain 0   Mild pain 1 Nagging, annoying, but does not interfere with basic activities of daily living (ADL). Patients are able to eat, bathe, get dressed, toileting (being able to get on and off the toilet and perform personal hygiene functions), transfer (move in and out of bed or a chair without assistance), and maintain continence (able to control bladder and bowel functions). Blood pressure and heart rate are unaffected. A normal heart rate for a healthy adult ranges from 60 to 100 bpm (beats per minute).   Mild to moderate pain 2 Noticeable and distracting. Impossible to hide from other people. More frequent flare-ups. Still possible to adapt and function close to normal. It can be very annoying and may have occasional stronger flare-ups. With discipline, patients may get used to it and adapt.   Moderate pain 3 Interferes significantly with activities of daily living (ADL). It  becomes difficult   to feed, bathe, get dressed, get on and off the toilet or to perform personal hygiene functions. Difficult to get in and out of bed or a chair without assistance. Very distracting. With effort, it can be ignored when deeply involved in activities.   Moderately severe pain 4 Impossible to ignore for more than a few minutes. With effort, patients may still be able to manage work or participate in some social activities. Very difficult to concentrate. Signs of autonomic nervous system discharge are evident: dilated pupils (mydriasis); mild sweating (diaphoresis); sleep interference. Heart rate becomes elevated (>115 bpm). Diastolic blood pressure (lower number) rises above 100 mmHg. Patients find relief in laying down and not moving.   Severe pain 5 Intense and extremely unpleasant. Associated with frowning face and frequent crying. Pain overwhelms the senses.  Ability to do any activity or maintain social relationships becomes significantly limited. Conversation becomes difficult. Pacing back and forth is common, as getting into a comfortable position is nearly impossible. Pain wakes you up from deep sleep. Physical signs will be obvious: pupillary dilation; increased sweating; goosebumps; brisk reflexes; cold, clammy hands and feet; nausea, vomiting or dry heaves; loss of appetite; significant sleep disturbance with inability to fall asleep or to remain asleep. When persistent, significant weight loss is observed due to the complete loss of appetite and sleep deprivation.  Blood pressure and heart rate becomes significantly elevated. Caution: If elevated blood pressure triggers a pounding headache associated with blurred vision, then the patient should immediately seek attention at an urgent or emergency care unit, as these may be signs of an impending stroke.    Emergency Department Pain Levels (6-10/10)  Emergency Room Pain 6 Severely limiting. Requires emergency care and should not be  seen or managed at an outpatient pain management facility. Communication becomes difficult and requires great effort. Assistance to reach the emergency department may be required. Facial flushing and profuse sweating along with potentially dangerous increases in heart rate and blood pressure will be evident.   Distressing pain 7 Self-care is very difficult. Assistance is required to transport, or use restroom. Assistance to reach the emergency department will be required. Tasks requiring coordination, such as bathing and getting dressed become very difficult.   Disabling pain 8 Self-care is no longer possible. At this level, pain is disabling. The individual is unable to do even the most "basic" activities such as walking, eating, bathing, dressing, transferring to a bed, or toileting. Fine motor skills are lost. It is difficult to think clearly.   Incapacitating pain 9 Pain becomes incapacitating. Thought processing is no longer possible. Difficult to remember your own name. Control of movement and coordination are lost.   The worst pain imaginable 10 At this level, most patients pass out from pain. When this level is reached, collapse of the autonomic nervous system occurs, leading to a sudden drop in blood pressure and heart rate. This in turn results in a temporary and dramatic drop in blood flow to the brain, leading to a loss of consciousness. Fainting is one of the body's self defense mechanisms. Passing out puts the brain in a calmed state and causes it to shut down for a while, in order to begin the healing process.    Summary: 1. Refer to this scale when providing us with your pain level. 2. Be accurate and careful when reporting your pain level. This will help with your care. 3. Over-reporting your pain level will lead to loss of credibility. 4. Even a level of   1/10 means that there is pain and will be treated at our facility. 5. High, inaccurate reporting will be documented as "Symptom  Exaggeration", leading to loss of credibility and suspicions of possible secondary gains such as obtaining more narcotics, or wanting to appear disabled, for fraudulent reasons. 6. Only pain levels of 5 or below will be seen at our facility. 7. Pain levels of 6 and above will be sent to the Emergency Department and the appointment cancelled. ____________________________________________________________________________________________    

## 2017-04-27 NOTE — Progress Notes (Signed)
Safety precautions to be maintained throughout the outpatient stay will include: orient to surroundings, keep bed in low position, maintain call bell within reach at all times, provide assistance with transfer out of bed and ambulation.  

## 2017-05-08 ENCOUNTER — Encounter (HOSPITAL_BASED_OUTPATIENT_CLINIC_OR_DEPARTMENT_OTHER): Payer: 59 | Attending: Internal Medicine

## 2017-05-08 ENCOUNTER — Other Ambulatory Visit: Payer: 59

## 2017-05-08 DIAGNOSIS — I83212 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 and inflammation: Secondary | ICD-10-CM | POA: Insufficient documentation

## 2017-05-08 DIAGNOSIS — L97312 Non-pressure chronic ulcer of right ankle with fat layer exposed: Secondary | ICD-10-CM | POA: Insufficient documentation

## 2017-05-08 DIAGNOSIS — L97322 Non-pressure chronic ulcer of left ankle with fat layer exposed: Secondary | ICD-10-CM | POA: Insufficient documentation

## 2017-05-08 DIAGNOSIS — I83223 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 and inflammation: Secondary | ICD-10-CM | POA: Insufficient documentation

## 2017-05-14 DIAGNOSIS — I83223 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 and inflammation: Secondary | ICD-10-CM | POA: Diagnosis not present

## 2017-05-14 DIAGNOSIS — L97322 Non-pressure chronic ulcer of left ankle with fat layer exposed: Secondary | ICD-10-CM | POA: Diagnosis not present

## 2017-05-14 DIAGNOSIS — L97312 Non-pressure chronic ulcer of right ankle with fat layer exposed: Secondary | ICD-10-CM | POA: Diagnosis not present

## 2017-05-14 DIAGNOSIS — I83212 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 and inflammation: Secondary | ICD-10-CM | POA: Diagnosis not present

## 2017-05-18 ENCOUNTER — Other Ambulatory Visit: Payer: Self-pay

## 2017-05-18 ENCOUNTER — Ambulatory Visit: Payer: 59 | Attending: Pain Medicine | Admitting: Pain Medicine

## 2017-05-18 ENCOUNTER — Encounter: Payer: Self-pay | Admitting: Pain Medicine

## 2017-05-18 VITALS — BP 108/63 | HR 51 | Temp 98.0°F | Resp 16 | Ht 68.0 in | Wt 120.0 lb

## 2017-05-18 DIAGNOSIS — M79604 Pain in right leg: Secondary | ICD-10-CM

## 2017-05-18 DIAGNOSIS — M25572 Pain in left ankle and joints of left foot: Secondary | ICD-10-CM

## 2017-05-18 DIAGNOSIS — M25562 Pain in left knee: Secondary | ICD-10-CM | POA: Diagnosis not present

## 2017-05-18 DIAGNOSIS — S81002A Unspecified open wound, left knee, initial encounter: Secondary | ICD-10-CM | POA: Diagnosis not present

## 2017-05-18 DIAGNOSIS — G8929 Other chronic pain: Secondary | ICD-10-CM

## 2017-05-18 DIAGNOSIS — Z87891 Personal history of nicotine dependence: Secondary | ICD-10-CM | POA: Diagnosis not present

## 2017-05-18 DIAGNOSIS — S81001A Unspecified open wound, right knee, initial encounter: Secondary | ICD-10-CM | POA: Diagnosis not present

## 2017-05-18 DIAGNOSIS — F419 Anxiety disorder, unspecified: Secondary | ICD-10-CM | POA: Insufficient documentation

## 2017-05-18 DIAGNOSIS — Z79899 Other long term (current) drug therapy: Secondary | ICD-10-CM

## 2017-05-18 DIAGNOSIS — I872 Venous insufficiency (chronic) (peripheral): Secondary | ICD-10-CM | POA: Diagnosis not present

## 2017-05-18 DIAGNOSIS — M79605 Pain in left leg: Secondary | ICD-10-CM

## 2017-05-18 DIAGNOSIS — G894 Chronic pain syndrome: Secondary | ICD-10-CM | POA: Diagnosis not present

## 2017-05-18 DIAGNOSIS — M25561 Pain in right knee: Secondary | ICD-10-CM

## 2017-05-18 DIAGNOSIS — L97919 Non-pressure chronic ulcer of unspecified part of right lower leg with unspecified severity: Secondary | ICD-10-CM

## 2017-05-18 DIAGNOSIS — I83029 Varicose veins of left lower extremity with ulcer of unspecified site: Secondary | ICD-10-CM

## 2017-05-18 DIAGNOSIS — I83019 Varicose veins of right lower extremity with ulcer of unspecified site: Secondary | ICD-10-CM

## 2017-05-18 DIAGNOSIS — M25571 Pain in right ankle and joints of right foot: Secondary | ICD-10-CM | POA: Diagnosis present

## 2017-05-18 DIAGNOSIS — Z79891 Long term (current) use of opiate analgesic: Secondary | ICD-10-CM | POA: Diagnosis not present

## 2017-05-18 DIAGNOSIS — I739 Peripheral vascular disease, unspecified: Secondary | ICD-10-CM | POA: Insufficient documentation

## 2017-05-18 DIAGNOSIS — L97929 Non-pressure chronic ulcer of unspecified part of left lower leg with unspecified severity: Secondary | ICD-10-CM

## 2017-05-18 NOTE — Progress Notes (Signed)
Nursing Pain Medication Assessment:  Safety precautions to be maintained throughout the outpatient stay will include: orient to surroundings, keep bed in low position, maintain call bell within reach at all times, provide assistance with transfer out of bed and ambulation.  Medication Inspection Compliance: Pill count conducted under aseptic conditions, in front of the patient. Neither the pills nor the bottle was removed from the patient's sight at any time. Once count was completed pills were immediately returned to the patient in their original bottle.  Medication: See above Pill/Patch Count: 107 of 120 pills remain Pill/Patch Appearance: Markings consistent with prescribed medication Bottle Appearance: Standard pharmacy container. Clearly labeled. Filled Date: 45 / 26 / 2018 Last Medication intake:  Today

## 2017-05-18 NOTE — Progress Notes (Signed)
Patient's Name: [name]  MRN: [id number]  Referring Provider: [name], PA-C  DOB: 1955-10-17  PCP: [name], PA-C  DOS: 05/18/2017  Note by: Gaspar Cola, MD  Service setting: Ambulatory outpatient  Specialty: Interventional Pain Management  Location: [facility] (AMB) Pain Management Facility    Patient type: Established   Primary Reason(s) for Visit: Encounter for prescription drug management. (Level of risk: moderate)  CC: Ankle Pain (left and right)  HPI  [name] is a 61 y.o. year old, female patient, who comes today for a medication management evaluation. She has Venous insufficiency; Open wound of knee, leg, and ankle; Status post split thickness skin graft; Chronic lower extremity pain (Secondary Area of Pain) (Bilateral) (L>R); Chronic ankle pain (Primary Area of Pain) (Bilateral) (L>R); Disorder of skeletal system; Long term current use of opiate analgesic; Chronic pain syndrome; Chronic knee pain (Tertiary Area of Pain) (Bilateral) (L>R); Opiate use; Pharmacologic therapy; Problems influencing health status; Chronic Venous stasis ulcers of lower extremities (Bilateral) (L>R); Anxiety due to invasive procedure; and Chronic low back pain on their problem list. Her primarily concern today is the Ankle Pain (left and right)  Pain Assessment: Location: Right, Left Ankle Radiating: radiates up to knees related to balance Onset: More than a month ago Duration: Chronic pain Quality: Aching, Constant, Discomfort Severity: 5 /10 (self-reported pain score)  Note: Reported level is inconsistent with clinical observations. Clinically the patient looks like a 1/10 A 1/10 is viewed as "Mild" and described as nagging, annoying, but not interfering with basic activities of daily living (ADL). [name] is able to eat, bathe, get dressed, do toileting (being able to get on and off the toilet and perform personal hygiene functions), transfer (move in and out of bed or a chair without  assistance), and maintain continence (able to control bladder and bowel functions). Physiologic parameters such as blood pressure and heart rate apear wnl.       When using our objective Pain Scale, levels between 6 and 10/10 are said to belong in an emergency room, as it progressively worsens from a 6/10, described as severely limiting, requiring emergency care not usually available at an outpatient pain management facility. At a 6/10 level, communication becomes difficult and requires great effort. Assistance to reach the emergency department may be required. Facial flushing and profuse sweating along with potentially dangerous increases in heart rate and blood pressure will be evident. Effect on ADL: prolonged walking, bending, unstable Timing: Constant Modifying factors: wound care, medications  [name] was last scheduled for an appointment on 04/27/2017 for medication management. During today's appointment we reviewed [name] chronic pain status, as well as her outpatient medication regimen. The patient returns to the clinics today for follow-up evaluation. She has only used 13 of the 120 tablets, in the last 22 days. This comes to an average of approximately 0.6 tab(s)/day. Based on that, this patient should not need a refill on these medications until June 2019. As soon as I informed her of this, she then indicated that occasionally she would be taking more medicine and that she didn't think that it would last this long. Apparently the patient does not understand that were not supposed to be providing patient with more narcotics than they would be using in a 30 day.Marland Kitchen However, because of her prior counts, we did provide her with 120 tablets and now based on our pill counts, it is clear that that is not what she was using. She admits that the  medicine completely eliminates her pain, but also makes her sleepy and therefore she uses it only at nighttime. During the day she controls her pain with  nonsteroidal anti-inflammatory drugs and she seems to be okay with this.  When she stated that she did not think that she was going to be able to make those medicine last longer, I went ahead and offer her the lumbar sympathetic block, which I believe is a good alternatives to her problem. She immediately responded that she was not going to have any type of procedures. Since the one I have been telling the patient that our specialties interventional pain management and that we will not be taking patients for medication management only. She has not is to believe that this was something that she was interested in, but now that she has her medications, she is telling me that she is not. I have politely asked the patient to then transfer her care to another physician that can write these medicines for her and allow other patients are currently waiting to command and take advantage of what we specializing. This patient does have the indications for this medication and she seems to be using it appropriately, and requiring very little amounts of it. It is my impression that 20-25 pills should be plenty to last her for 30 days. That is not a huge amount and it can be written by her primary care physician. To avoid issues with the " Stop ACT", all they have to do is state in the prescription that it is for "chronic pain". This should allow them to prescribe for 30 days.   Today and I spent quite a bit of time explaining to this patient what the lumbar sympathetic block is, and what it is supposed to do. I informed her that she needs to look it up in the Internet since she does have access to it and decide whether or not she wants to proceed with it. If she does not, then I have recommended that she follow-up with her primary care physician for further medication refills.   The patient also decided that she did not want to do the MRI that I ordered. Unfortunately, the impression that I get from this patient is that  she does not trust me and therefore this is not a good basis for a patient physician relationship.   The patient  reports that she does not use drugs. Her body mass index is 18.25 kg/m.  Further details on both, my assessment(s), as well as the proposed treatment plan, please see below.  Controlled Substance Pharmacotherapy Assessment REMS (Risk Evaluation and Mitigation Strategy)  Analgesic: Oxycodone IR 5 mg, 1/2 tab PO QD MME/day: 3.75 mg/day.  Ignatius Specking, RN  05/18/2017  1:52 PM  Sign at close encounter Nursing Pain Medication Assessment:  Safety precautions to be maintained throughout the outpatient stay will include: orient to surroundings, keep bed in low position, maintain call bell within reach at all times, provide assistance with transfer out of bed and ambulation.  Medication Inspection Compliance: Pill count conducted under aseptic conditions, in front of the patient. Neither the pills nor the bottle was removed from the patient's sight at any time. Once count was completed pills were immediately returned to the patient in their original bottle.  Medication: See above Pill/Patch Count: 107 of 120 pills remain (She has used 13 pills in 22 days, about 1/2 pill/day. The remainder 107 pills should last her another 214 day)([employer]. 12/18/2017) Pill/Patch Appearance:  Markings consistent with prescribed medication Bottle Appearance: Standard pharmacy container. Clearly labeled. Filled Date: 34 / 26 / 2018 Last Medication intake:  Today   Pharmacokinetics: Liberation and absorption (onset of action): WNL Distribution (time to peak effect): WNL Metabolism and excretion (duration of action): WNL         Pharmacodynamics: Desired effects: Analgesia: Ms. Markwell reports >50% benefit. Functional ability: Patient reports that medication allows her to accomplish basic ADLs Clinically meaningful improvement in function (CMIF): Sustained CMIF goals met Perceived effectiveness:  Described as relatively effective, allowing for increase in activities of daily living (ADL) Undesirable effects: Side-effects or Adverse reactions: None reported Monitoring: [place] PMP: Online review of the past 48-monthperiod conducted. Compliant with practice rules and regulations Last UDS on record: Summary  Date Value Ref Range Status  04/07/2017 FINAL  Final    Comment:    ==================================================================== [facility]RE COMP DRUG ANALYSIS,UR ==================================================================== Test                             Result       Flag       Units Drug Present and Declared for Prescription Verification   Hydrocodone                    1003         EXPECTED   ng/mg creat   Hydromorphone                  486          EXPECTED   ng/mg creat   Norhydrocodone                 1283         EXPECTED   ng/mg creat    Sources of hydrocodone include scheduled prescription    medications. Hydromorphone and norhydrocodone are expected    metabolites of hydrocodone. Hydromorphone is also available as a    scheduled prescription medication.   Acetaminophen                  PRESENT      EXPECTED Drug Present not Declared for Prescription Verification   Lidocaine                      PRESENT      UNEXPECTED ==================================================================== Test                      Result    Flag   Units      Ref Range   Creatinine              35               mg/dL      >=20 ==================================================================== Declared Medications:  The flagging and interpretation on this report are based on the  following declared medications.  Unexpected results may arise from  inaccuracies in the declared medications.  **Note: The testing scope of this panel includes these medications:  Hydrocodone (Hydrocodone-Acetaminophen)  **Note: The testing scope of this panel does not include small to  moderate  amounts of these reported medications:  Acetaminophen (Hydrocodone-Acetaminophen)  **Note: The testing scope of this panel does not include following  reported medications:  Collagenase (Santyl)  Multivitamin (MVI)  Omega-3 Fatty Acids  Vitamin C ==================================================================== For clinical consultation, please call ([phone] ====================================================================    UDS interpretation: Compliant  Medication Assessment Form: Reviewed. Patient indicates being compliant with therapy Treatment compliance: Compliant Risk Assessment Profile: Aberrant behavior: See prior evaluations. None observed or detected today Comorbid factors increasing risk of overdose: See prior notes. No additional risks detected today Risk of substance use disorder (SUD): Low Opioid Risk Tool - 05/18/17 1349      Family History of Substance Abuse   Alcohol  Negative    Illegal Drugs  Negative    Rx Drugs  Negative      Personal History of Substance Abuse   Alcohol  Negative    Illegal Drugs  Negative    Rx Drugs  Negative      Total Score   Opioid Risk Tool Scoring  0    Opioid Risk Interpretation  Low Risk      ORT Scoring interpretation table:  Score <3 = Low Risk for SUD  Score between 4-7 = Moderate Risk for SUD  Score >8 = High Risk for Opioid Abuse   Risk Mitigation Strategies:  Patient Counseling: Covered Patient-Prescriber Agreement (PPA): Present and active  Notification to other healthcare providers: Done  Pharmacologic Plan: No change in therapy, at this time  Laboratory Chemistry  Inflammation Markers (CRP: Acute Phase) (ESR: Chronic Phase) Lab Results  Component Value Date   CRP 5.5 (H) 04/07/2017   ESRSEDRATE 4 04/07/2017                 Rheumatology Markers No results found for: Benay Spice, LYMEIGGIGMAB, [facility]              Renal Function Markers Lab Results  Component  Value Date   BUN 7 04/07/2017   CREATININE 0.67 04/07/2017   GFRAA 111 04/07/2017   GFRNONAA 97 04/07/2017                 Hepatic Function Markers Lab Results  Component Value Date   AST 22 04/07/2017   ALBUMIN 4.3 04/07/2017   ALKPHOS 46 04/07/2017                 Electrolytes Lab Results  Component Value Date   NA 143 04/07/2017   K 4.4 04/07/2017   CL 104 04/07/2017   CALCIUM 9.0 04/07/2017   MG 2.3 04/07/2017                 Neuropathy Markers Lab Results  Component Value Date   VITAMINB12 511 04/07/2017   FOLATE 17.6 11/11/2016                 Bone Pathology Markers Lab Results  Component Value Date   25OHVITD1 35 04/07/2017   25OHVITD2 <1.0 04/07/2017   25OHVITD3 34 04/07/2017                 Coagulation Parameters No results found for: INR, LABPROT, APTT, PLT, DDIMER               Cardiovascular Markers Lab Results  Component Value Date   HGB 13.3 10/15/2015   HCT 39.0 10/15/2015                 CA Markers No results found for: CEA, CA125, LABCA2               Note: Lab results reviewed.  Recent Diagnostic Imaging Results  DG Ankle Complete Left CLINICAL DATA:  Chronic bilateral ankle and knee pain, nonhealing wound along the medial ankles bilaterally  EXAM: LEFT ANKLE COMPLETE - 3+ VIEW  COMPARISON:  None.  FINDINGS: No fracture or dislocation is seen.  The ankle mortise is intact.  The base of the fifth metatarsal is unremarkable.  Mild soft tissue irregularity overlying the medial malleolus with overlying bandage.  No radiopaque foreign body is seen.  IMPRESSION: Mild soft tissue irregularity overlying the medial malleolus.  No fracture, dislocation, or radiopaque foreign body is seen.  Electronically Signed   By: Julian Hy M.D.   On: 04/07/2017 20:57 DG Ankle Complete Right CLINICAL DATA:  Chronic bilateral ankle and knee pain, nonhealing wound along the medial ankles bilaterally  EXAM: RIGHT ANKLE -  COMPLETE 3+ VIEW  COMPARISON:  None.  FINDINGS: No fracture or dislocation is seen.  The ankle mortise is intact.  The base of the fifth metatarsal is unremarkable.  Visualized soft tissues are grossly unremarkable.  No radiopaque foreign body is seen.  IMPRESSION: Negative.  Electronically Signed   By: Julian Hy M.D.   On: 04/07/2017 20:56 DG Knee 1-2 Views Right CLINICAL DATA:  Chronic bilateral ankle and knee pain  EXAM: RIGHT KNEE - 1-2 VIEW  COMPARISON:  None.  FINDINGS: No fracture or dislocation is seen.  The joint spaces are preserved.  Visualized soft tissues are within normal limits.  No suprapatellar knee joint effusion.  IMPRESSION: Negative.  Electronically Signed   By: Julian Hy M.D.   On: 04/07/2017 20:55 DG Knee 1-2 Views Left CLINICAL DATA:  Chronic bilateral ankle and knee pain  EXAM: LEFT KNEE - 1-2 VIEW  COMPARISON:  None.  FINDINGS: No fracture or dislocation is seen.  The joint spaces are preserved.  Visualized soft tissues are within normal limits.  No suprapatellar knee joint effusion.  IMPRESSION: Negative.  Electronically Signed   By: Julian Hy M.D.   On: 04/07/2017 20:55  Complexity Note: Imaging results reviewed. Results shared with Ms. Houseworth, using Layman's terms.                         Meds   Current Outpatient Medications:  Marland Kitchen  Multiple Vitamin (MULTIVITAMIN) capsule, Take 1 capsule by mouth daily., Disp: , Rfl:  .  Omega-3 Fatty Acids (FISH OIL) 1000 MG CAPS, Take by mouth., Disp: , Rfl:  .  oxyCODONE (OXY IR/ROXICODONE) 5 MG immediate release tablet, Take 1 tablet (5 mg total) by mouth every 6 (six) hours as needed for severe pain., Disp: 120 tablet, Rfl: 0 .  SANTYL ointment, , Disp: , Rfl:  .  vitamin C (ASCORBIC ACID) 500 MG tablet, Take 500 mg by mouth daily., Disp: , Rfl:   ROS  Constitutional: Denies any fever or chills Gastrointestinal: No reported hemesis, hematochezia,  vomiting, or acute GI distress Musculoskeletal: Denies any acute onset joint swelling, redness, loss of ROM, or weakness Neurological: No reported episodes of acute onset apraxia, aphasia, dysarthria, agnosia, amnesia, paralysis, loss of coordination, or loss of consciousness  Allergies  Ms. Jann is allergic to penicillins.  [place]  Drug: Ms. Geiselman  reports that she does not use drugs. Alcohol:  reports that she drinks alcohol. Tobacco:  reports that she quit smoking about 10 years ago. Her smoking use included cigarettes. She has a 6.50 pack-year smoking history. she has never used smokeless tobacco. Medical:  has a past medical history of Allergy, Ankle wound (02/2016), Dental bridge present, Dental crowns present, Open wounds involving multiple regions of lower extremity, Peripheral vascular disease ([place]), and PONV (postoperative nausea and vomiting). Surgical: Ms. Pauli  has a past surgical history that includes Ectopic pregnancy surgery (1996); Vein ligation and stripping (Left, late 1990's); Cardiac catheterization (N/A, 10/15/2015); I&D extremity (Bilateral, 12/12/2015); Application if wound vac (Bilateral, 12/12/2015); I&D extremity (Bilateral, 01/07/2016); Tonsillectomy; Wound debridement (Left, 02/14/2016); Dressing change under anesthesia (Right, 02/14/2016); and skin graph. Family: family history includes Diabetes in her maternal grandmother and paternal grandmother; Healthy in her brother; Hypertension in her mother; Lung cancer in her father.  Constitutional Exam  General appearance: Well nourished, well developed, and well hydrated. In no apparent acute distress Vitals:   05/18/17 1338  BP: 108/63  Pulse: (!) 51  Resp: 16  Temp: 98 F (36.7 C)  SpO2: 100%  Weight: 120 lb (54.4 kg)  Height: 5' 8" (1.727 m)   BMI Assessment: Estimated body mass index is 18.25 kg/m as calculated from the following:   Height as of this encounter: 5' 8" (1.727 m).   Weight as of this encounter: 120  lb (54.4 kg).  BMI interpretation table: BMI level Category Range association with higher incidence of chronic pain  <18 kg/m2 Underweight   18.5-24.9 kg/m2 Ideal body weight   25-29.9 kg/m2 Overweight Increased incidence by 20%  30-34.9 kg/m2 Obese (Class I) Increased incidence by 68%  35-39.9 kg/m2 Severe obesity (Class II) Increased incidence by 136%  >40 kg/m2 Extreme obesity (Class III) Increased incidence by 254%   BMI Readings from Last 4 Encounters:  05/18/17 18.25 kg/m  04/27/17 18.25 kg/m  04/07/17 19.16 kg/m  10/30/16 19.33 kg/m   Wt Readings from Last 4 Encounters:  05/18/17 120 lb (54.4 kg)  04/27/17 120 lb (54.4 kg)  04/07/17 126 lb (57.2 kg)  10/30/16 127 lb 2 oz (57.7 kg)  Psych/Mental status: Alert, oriented x 3 (person, place, & time)       Eyes: PERLA Respiratory: No evidence of acute respiratory distress  Cervical Spine Area Exam  Skin & Axial Inspection: No masses, redness, edema, swelling, or associated skin lesions Alignment: Symmetrical Functional ROM: Unrestricted ROM      Stability: No instability detected Muscle Tone/Strength: Functionally intact. No obvious neuro-muscular anomalies detected. Sensory (Neurological): Unimpaired Palpation: No palpable anomalies              Upper Extremity (UE) Exam    Side: Right upper extremity  Side: Left upper extremity  Skin & Extremity Inspection: Skin color, temperature, and hair growth are WNL. No peripheral edema or cyanosis. No masses, redness, swelling, asymmetry, or associated skin lesions. No contractures.  Skin & Extremity Inspection: Skin color, temperature, and hair growth are WNL. No peripheral edema or cyanosis. No masses, redness, swelling, asymmetry, or associated skin lesions. No contractures.  Functional ROM: Unrestricted ROM          Functional ROM: Unrestricted ROM          Muscle Tone/Strength: Functionally intact. No obvious neuro-muscular anomalies detected.  Muscle Tone/Strength:  Functionally intact. No obvious neuro-muscular anomalies detected.  Sensory (Neurological): Unimpaired          Sensory (Neurological): Unimpaired          Palpation: No palpable anomalies              Palpation: No palpable anomalies              Specialized Test(s): Deferred         Specialized Test(s): Deferred          Thoracic Spine Area Exam  Skin & Axial Inspection: No masses, redness, or swelling Alignment:  Symmetrical Functional ROM: Unrestricted ROM Stability: No instability detected Muscle Tone/Strength: Functionally intact. No obvious neuro-muscular anomalies detected. Sensory (Neurological): Unimpaired Muscle strength & Tone: No palpable anomalies  Lumbar Spine Area Exam  Skin & Axial Inspection: No masses, redness, or swelling Alignment: Symmetrical Functional ROM: Unrestricted ROM      Stability: No instability detected Muscle Tone/Strength: Functionally intact. No obvious neuro-muscular anomalies detected. Sensory (Neurological): Unimpaired Palpation: No palpable anomalies       Provocative Tests: Lumbar Hyperextension and rotation test: evaluation deferred today       Lumbar Lateral bending test: evaluation deferred today       Patrick's Maneuver: evaluation deferred today                    Gait & Posture Assessment  Ambulation: Unassisted Gait: Relatively normal for age and body habitus Posture: WNL   Lower Extremity Exam    Side: Right lower extremity  Side: Left lower extremity  Skin & Extremity Inspection: Venous stasis ulcers  Skin & Extremity Inspection: Venous stasis ulcers  Functional ROM: Unrestricted ROM          Functional ROM: Unrestricted ROM          Muscle Tone/Strength: Functionally intact. No obvious neuro-muscular anomalies detected.  Muscle Tone/Strength: Functionally intact. No obvious neuro-muscular anomalies detected.  Sensory (Neurological): Unimpaired  Sensory (Neurological): Unimpaired  Palpation: No palpable anomalies  Palpation: No  palpable anomalies   Assessment  Primary Diagnosis & Pertinent Problem List: The primary encounter diagnosis was Chronic ankle pain (Primary Area of Pain) (Bilateral) (L>R). Diagnoses of Chronic lower extremity pain (Secondary Area of Pain) (Bilateral) (L>R), Chronic knee pain (Tertiary Area of Pain) (Bilateral) (L>R), Chronic pain syndrome, Venous insufficiency, Chronic Venous stasis ulcers of lower extremities (Bilateral) (L>R), and Pharmacologic therapy were also pertinent to this visit.  Status Diagnosis  Persistent Persistent Persistent 1. Chronic ankle pain (Primary Area of Pain) (Bilateral) (L>R)   2. Chronic lower extremity pain (Secondary Area of Pain) (Bilateral) (L>R)   3. Chronic knee pain (Tertiary Area of Pain) (Bilateral) (L>R)   4. Chronic pain syndrome   5. Venous insufficiency   6. Chronic Venous stasis ulcers of lower extremities (Bilateral) (L>R)   7. Pharmacologic therapy     Problems updated and reviewed during this visit: No problems updated. Plan of Care  Pharmacotherapy (Medications Ordered): No orders of the defined types were placed in this encounter.  Medications administered today: Jennalee Greaves had no medications administered during this visit.   Procedure Orders     LUMBAR SYMPATHETIC BLOCK Lab Orders  No laboratory test(s) ordered today   Imaging Orders  No imaging studies ordered today   Referral Orders  No referral(s) requested today    Interventional management options: Planned, scheduled, and/or pending:   Diagnostic bilateral lumbar sympathetic blocks to determine if increased blood flow to the lower extremity can help heal these ulcers. The patient may be a candidate for a bilateral lumbar sympathetic RFA to sustain this increased blood flow. Another alternative to accomplish the same would be to implant a spinal cord stimulator. This type of devices have been shown to increase blood flow to the lower extremities. There is sufficient  evidence in the [place] literature to sustain this indication.    Considering:   Diagnostic bilateral lumbar sympathetic block (LSB)  Possible bilateral lumbar sympathetic RFA  Possible bilateral  Lumbar spinal stimulator trial to improve blood flow    Palliative PRN treatment(s):   None  at this time   Provider-requested follow-up: Return for Procedure (w/ sedation): (B) LSB (PRN). Medication management and refill by Dionisio David, NP, from now on. As time goes by and we confirmed that she is following all of the medication rules, we will extend on the time between visits from 1 month to a maximum of 3.  No future appointments. Primary Care Physician: [name], PA-C Location: [facility] Note by: Gaspar Cola, MD Date: 05/18/2017; Time: 3:12 PM

## 2017-05-18 NOTE — Patient Instructions (Addendum)
____________________________________________________________________________________________  Preparing for Procedure with Sedation Instructions: . Oral Intake: Do not eat or drink anything for at least 8 hours prior to your procedure. . Transportation: Public transportation is not allowed. Bring an adult driver. The driver must be physically present in our waiting room before any procedure can be started. . Physical Assistance: Bring an adult physically capable of assisting you, in the event you need help. This adult should keep you company at home for at least 6 hours after the procedure. . Blood Pressure Medicine: Take your blood pressure medicine with a sip of water the morning of the procedure. . Blood thinners:  . Diabetics on insulin: Notify the staff so that you can be scheduled 1st case in the morning. If your diabetes requires high dose insulin, take only  of your normal insulin dose the morning of the procedure and notify the staff that you have done so. . Preventing infections: Shower with an antibacterial soap the morning of your procedure. . Build-up your immune system: Take 1000 mg of Vitamin C with every meal (3 times a day) the day prior to your procedure. . Antibiotics: Inform the staff if you have a condition or reason that requires you to take antibiotics before dental procedures. . Pregnancy: If you are pregnant, call and cancel the procedure. . Sickness: If you have a cold, fever, or any active infections, call and cancel the procedure. . Arrival: You must be in the facility at least 30 minutes prior to your scheduled procedure. . Children: Do not bring children with you. . Dress appropriately: Bring dark clothing that you would not mind if they get stained. . Valuables: Do not bring any jewelry or valuables. Procedure appointments are reserved for interventional treatments only. . No Prescription Refills. . No medication changes will be discussed during procedure  appointments. . No disability issues will be discussed. ____________________________________________________________________________________________   GENERAL RISKS AND COMPLICATIONS  What are the risk, side effects and possible complications? Generally speaking, most procedures are safe.  However, with any procedure there are risks, side effects, and the possibility of complications.  The risks and complications are dependent upon the sites that are lesioned, or the type of nerve block to be performed.  The closer the procedure is to the spine, the more serious the risks are.  Great care is taken when placing the radio frequency needles, block needles or lesioning probes, but sometimes complications can occur. 1. Infection: Any time there is an injection through the skin, there is a risk of infection.  This is why sterile conditions are used for these blocks.  There are four possible types of infection. 1. Localized skin infection. 2. Central Nervous System Infection-This can be in the form of Meningitis, which can be deadly. 3. Epidural Infections-This can be in the form of an epidural abscess, which can cause pressure inside of the spine, causing compression of the spinal cord with subsequent paralysis. This would require an emergency surgery to decompress, and there are no guarantees that the patient would recover from the paralysis. 4. Discitis-This is an infection of the intervertebral discs.  It occurs in about 1% of discography procedures.  It is difficult to treat and it may lead to surgery.        2. Pain: the needles have to go through skin and soft tissues, will cause soreness.       3. Damage to internal structures:  The nerves to be lesioned may be near blood vessels or      other nerves which can be potentially damaged.       4. Bleeding: Bleeding is more common if the patient is taking blood thinners such as  aspirin, Coumadin, Ticiid, Plavix, etc., or if he/she have some genetic  predisposition  such as hemophilia. Bleeding into the spinal canal can cause compression of the spinal  cord with subsequent paralysis.  This would require an emergency surgery to  decompress and there are no guarantees that the patient would recover from the  paralysis.       5. Pneumothorax:  Puncturing of a lung is a possibility, every time a needle is introduced in  the area of the chest or upper back.  Pneumothorax refers to free air around the  collapsed lung(s), inside of the thoracic cavity (chest cavity).  Another two possible  complications related to a similar event would include: Hemothorax and Chylothorax.   These are variations of the Pneumothorax, where instead of air around the collapsed  lung(s), you may have blood or chyle, respectively.       6. Spinal headaches: They may occur with any procedures in the area of the spine.       7. Persistent CSF (Cerebro-Spinal Fluid) leakage: This is a rare problem, but may occur  with prolonged intrathecal or epidural catheters either due to the formation of a fistulous  track or a dural tear.       8. Nerve damage: By working so close to the spinal cord, there is always a possibility of  nerve damage, which could be as serious as a permanent spinal cord injury with  paralysis.       9. Death:  Although rare, severe deadly allergic reactions known as "Anaphylactic  reaction" can occur to any of the medications used.      10. Worsening of the symptoms:  We can always make thing worse.  What are the chances of something like this happening? Chances of any of this occuring are extremely low.  By statistics, you have more of a chance of getting killed in a motor vehicle accident: while driving to the hospital than any of the above occurring .  Nevertheless, you should be aware that they are possibilities.  In general, it is similar to taking a shower.  Everybody knows that you can slip, hit your head and get killed.  Does that mean that you should not  shower again?  Nevertheless always keep in mind that statistics do not mean anything if you happen to be on the wrong side of them.  Even if a procedure has a 1 (one) in a 1,000,000 (million) chance of going wrong, it you happen to be that one..Also, keep in mind that by statistics, you have more of a chance of having something go wrong when taking medications.  Who should not have this procedure? If you are on a blood thinning medication (e.g. Coumadin, Plavix, see list of "Blood Thinners"), or if you have an active infection going on, you should not have the procedure.  If you are taking any blood thinners, please inform your physician.  How should I prepare for this procedure?  Do not eat or drink anything at least six hours prior to the procedure.  Bring a driver with you .  It cannot be a taxi.  Come accompanied by an adult that can drive you back, and that is strong enough to help you if your legs get weak or numb from the local anesthetic.  Take   all of your medicines the morning of the procedure with just enough water to swallow them.  If you have diabetes, make sure that you are scheduled to have your procedure done first thing in the morning, whenever possible.  If you have diabetes, take only half of your insulin dose and notify our nurse that you have done so as soon as you arrive at the clinic.  If you are diabetic, but only take blood sugar pills (oral hypoglycemic), then do not take them on the morning of your procedure.  You may take them after you have had the procedure.  Do not take aspirin or any aspirin-containing medications, at least eleven (11) days prior to the procedure.  They may prolong bleeding.  Wear loose fitting clothing that may be easy to take off and that you would not mind if it got stained with Betadine or blood.  Do not wear any jewelry or perfume  Remove any nail coloring.  It will interfere with some of our monitoring equipment.  NOTE: Remember that  this is not meant to be interpreted as a complete list of all possible complications.  Unforeseen problems may occur.  BLOOD THINNERS The following drugs contain aspirin or other products, which can cause increased bleeding during surgery and should not be taken for 2 weeks prior to and 1 week after surgery.  If you should need take something for relief of minor pain, you may take acetaminophen which is found in Tylenol,m Datril, Anacin-3 and Panadol. It is not blood thinner. The products listed below are.  Do not take any of the products listed below in addition to any listed on your instruction sheet.  A.P.C or A.P.C with Codeine Codeine Phosphate Capsules #3 Ibuprofen Ridaura  ABC compound Congesprin Imuran rimadil  Advil Cope Indocin Robaxisal  Alka-Seltzer Effervescent Pain Reliever and Antacid Coricidin or Coricidin-D  Indomethacin Rufen  Alka-Seltzer plus Cold Medicine Cosprin Ketoprofen S-A-C Tablets  Anacin Analgesic Tablets or Capsules Coumadin Korlgesic Salflex  Anacin Extra Strength Analgesic tablets or capsules CP-2 Tablets Lanoril Salicylate  Anaprox Cuprimine Capsules Levenox Salocol  Anexsia-D Dalteparin Magan Salsalate  Anodynos Darvon compound Magnesium Salicylate Sine-off  Ansaid Dasin Capsules Magsal Sodium Salicylate  Anturane Depen Capsules Marnal Soma  APF Arthritis pain formula Dewitt's Pills Measurin Stanback  Argesic Dia-Gesic Meclofenamic Sulfinpyrazone  Arthritis Bayer Timed Release Aspirin Diclofenac Meclomen Sulindac  Arthritis pain formula Anacin Dicumarol Medipren Supac  Analgesic (Safety coated) Arthralgen Diffunasal Mefanamic Suprofen  Arthritis Strength Bufferin Dihydrocodeine Mepro Compound Suprol  Arthropan liquid Dopirydamole Methcarbomol with Aspirin Synalgos  ASA tablets/Enseals Disalcid Micrainin Tagament  Ascriptin Doan's Midol Talwin  Ascriptin A/D Dolene Mobidin Tanderil  Ascriptin Extra Strength Dolobid Moblgesic Ticlid  Ascriptin with Codeine  Doloprin or Doloprin with Codeine Momentum Tolectin  Asperbuf Duoprin Mono-gesic Trendar  Aspergum Duradyne Motrin or Motrin IB Triminicin  Aspirin plain, buffered or enteric coated Durasal Myochrisine Trigesic  Aspirin Suppositories Easprin Nalfon Trillsate  Aspirin with Codeine Ecotrin Regular or Extra Strength Naprosyn Uracel  Atromid-S Efficin Naproxen Ursinus  Auranofin Capsules Elmiron Neocylate Vanquish  Axotal Emagrin Norgesic Verin  Azathioprine Empirin or Empirin with Codeine Normiflo Vitamin E  Azolid Emprazil Nuprin Voltaren  Bayer Aspirin plain, buffered or children's or timed BC Tablets or powders Encaprin Orgaran Warfarin Sodium  Buff-a-Comp Enoxaparin Orudis Zorpin  Buff-a-Comp with Codeine Equegesic Os-Cal-Gesic   Buffaprin Excedrin plain, buffered or Extra Strength Oxalid   Bufferin Arthritis Strength Feldene Oxphenbutazone   Bufferin plain or Extra Strength Feldene Capsules   Oxycodone with Aspirin   Bufferin with Codeine Fenoprofen Fenoprofen Pabalate or Pabalate-SF   Buffets II Flogesic Panagesic   Buffinol plain or Extra Strength Florinal or Florinal with Codeine Panwarfarin   Buf-Tabs Flurbiprofen Penicillamine   Butalbital Compound Four-way cold tablets Penicillin   Butazolidin Fragmin Pepto-Bismol   Carbenicillin Geminisyn Percodan   Carna Arthritis Reliever Geopen Persantine   Carprofen Gold's salt Persistin   Chloramphenicol Goody's Phenylbutazone   Chloromycetin Haltrain Piroxlcam   Clmetidine heparin Plaquenil   Cllnoril Hyco-pap Ponstel   Clofibrate Hydroxy chloroquine Propoxyphen         Before stopping any of these medications, be sure to consult the physician who ordered them.  Some, such as Coumadin (Warfarin) are ordered to prevent or treat serious conditions such as "deep thrombosis", "pumonary embolisms", and other heart problems.  The amount of time that you may need off of the medication may also vary with the medication and the reason for which  you were taking it.  If you are taking any of these medications, please make sure you notify your pain physician before you undergo any procedures.         Lumbar Sympathetic Block Patient Information  Description: The lumbar plexus is a group of nerves that are part of the sympathetic nervous system.  These nerves supply organs in the pelvis and legs.  Lumbar sympathetic blocks are utilized for the diagnosis and treatment of painful conditions in these areas.   The lumbar plexus is located on both sides of the aorta at approximately the level of the second lumbar vertebral body.  The block will be performed with you lying on your abdomen with a pillow underneath.  Using direct x-ray guidance,   The plexus will be located on both sides of the spine.  Numbing medicine will be used to deaden the skin prior to needle insertion.  In most cases, a small amount of sedation can be give by IV prior to the numbing medicine.  One or two small needles will be placed near the plexus and local anesthetic will be injected.  This may make your leg(s) feel warm.  The Entire block usually lasts about 15-25 minutes.  Conditions which may be treated by lumbar sympathetic block:   Reflex sympathetic dystrophy  Phantom limb pain  Peripheral neuropathy  Peripheral vascular disease ( inadequate blood flow )  Cancer pain of pelvis, leg and kidney  Preparation for the injection:  1. Do note eat any solid food or diary products within 8 hours of your appointment. 2. You may drink clear liquids up to 3 hours before appointment.  Clear liquids include water, black coffee, juice or soda.  No milk or cream please. 3. You may take your regular medication, including pain medications, with a sip of water before you appointment.  Diabetics should hold regular insulin ( if taken separately ) and take 1/2 NPH dose the morning of the procedure .  Carry some sugar containing items with you to your appointment. 4. A  driver must accompany you and be prepared to drive you home after your procedure. 5. Bring all your current medication with you. 6. An IV may be inserted and sedation may be given at the discretion of the physician.  7. A blood pressure cuff, EKG and other monitors will often be applied during the procedure.  Some patients may need to have extra oxygen administered for a short period. 8. You will be asked to provide medical information, including your allergies  and medications, prior to the procedure.  We must know immediately if your taking blood thinners (like Coumadin/Warfarin) or if you are allergic to IV iodine contrast (dye).  We must know if you could possibly be pregnant.  Possible side-effects   Bleeding from needle site or deeper  Infection (rare, can require surgery)  Nerve injury (rare)  Numbness & tingling (temporary)  Collapsed lung (rare)  Spinal headache (a headache worse with upright posture)  Light-headedness (temporary)  Pain at injection site (several days)  Decreased blood pressure (temporary)  Weakness in legs (temporary)  Seizure or other drug reaction (rare)  Call if you experience:   Fever/chills associated with headache or increased back/ neck pain  Headache worsened by an upright position  New onset weakness or numbness of an extremity below the injection site  Hives or difficulty breathing ( go to the emergency room)  Inflammation or drainage at the injections site(s)  New symptoms which are concerning to you  Please note:  If effective, we will often do a series of 2-3 injections spaced 3-6 weeks apart to maximally decrease your pain.  If initial series is effective, you may be a candidate for a more permanent block of the lumbar sympathetic plexus.  If you have any questions please call [phone] [facility]

## 2017-05-19 DIAGNOSIS — I83223 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 and inflammation: Secondary | ICD-10-CM | POA: Diagnosis not present

## 2017-05-22 DIAGNOSIS — I83223 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 and inflammation: Secondary | ICD-10-CM | POA: Diagnosis not present

## 2017-05-28 DIAGNOSIS — I83223 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 and inflammation: Secondary | ICD-10-CM | POA: Diagnosis not present

## 2017-06-05 ENCOUNTER — Encounter (HOSPITAL_BASED_OUTPATIENT_CLINIC_OR_DEPARTMENT_OTHER): Payer: 59 | Attending: Internal Medicine

## 2017-06-05 DIAGNOSIS — I83223 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 and inflammation: Secondary | ICD-10-CM | POA: Insufficient documentation

## 2017-06-05 DIAGNOSIS — L97322 Non-pressure chronic ulcer of left ankle with fat layer exposed: Secondary | ICD-10-CM | POA: Insufficient documentation

## 2017-06-05 DIAGNOSIS — L03116 Cellulitis of left lower limb: Secondary | ICD-10-CM | POA: Diagnosis not present

## 2017-06-05 DIAGNOSIS — L97312 Non-pressure chronic ulcer of right ankle with fat layer exposed: Secondary | ICD-10-CM | POA: Diagnosis not present

## 2017-06-05 DIAGNOSIS — I83213 Varicose veins of right lower extremity with both ulcer of ankle and inflammation: Secondary | ICD-10-CM | POA: Insufficient documentation

## 2017-06-10 DIAGNOSIS — I83213 Varicose veins of right lower extremity with both ulcer of ankle and inflammation: Secondary | ICD-10-CM | POA: Diagnosis not present

## 2017-06-11 ENCOUNTER — Ambulatory Visit: Payer: 59 | Admitting: Pain Medicine

## 2017-06-12 DIAGNOSIS — I83213 Varicose veins of right lower extremity with both ulcer of ankle and inflammation: Secondary | ICD-10-CM | POA: Diagnosis not present

## 2017-06-16 DIAGNOSIS — I83213 Varicose veins of right lower extremity with both ulcer of ankle and inflammation: Secondary | ICD-10-CM | POA: Diagnosis not present

## 2017-06-19 DIAGNOSIS — I83213 Varicose veins of right lower extremity with both ulcer of ankle and inflammation: Secondary | ICD-10-CM | POA: Diagnosis not present

## 2017-06-24 DIAGNOSIS — I83213 Varicose veins of right lower extremity with both ulcer of ankle and inflammation: Secondary | ICD-10-CM | POA: Diagnosis not present

## 2017-06-26 DIAGNOSIS — I83213 Varicose veins of right lower extremity with both ulcer of ankle and inflammation: Secondary | ICD-10-CM | POA: Diagnosis not present

## 2017-07-01 DIAGNOSIS — I83213 Varicose veins of right lower extremity with both ulcer of ankle and inflammation: Secondary | ICD-10-CM | POA: Diagnosis not present

## 2017-07-03 ENCOUNTER — Encounter (HOSPITAL_BASED_OUTPATIENT_CLINIC_OR_DEPARTMENT_OTHER): Payer: 59 | Attending: Internal Medicine

## 2017-07-03 DIAGNOSIS — I83212 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 and inflammation: Secondary | ICD-10-CM | POA: Diagnosis not present

## 2017-07-03 DIAGNOSIS — L97312 Non-pressure chronic ulcer of right ankle with fat layer exposed: Secondary | ICD-10-CM | POA: Insufficient documentation

## 2017-07-03 DIAGNOSIS — I83223 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 and inflammation: Secondary | ICD-10-CM | POA: Insufficient documentation

## 2017-07-03 DIAGNOSIS — L97322 Non-pressure chronic ulcer of left ankle with fat layer exposed: Secondary | ICD-10-CM | POA: Insufficient documentation

## 2017-07-03 DIAGNOSIS — L97819 Non-pressure chronic ulcer of other part of right lower leg with unspecified severity: Secondary | ICD-10-CM | POA: Diagnosis not present

## 2017-07-07 DIAGNOSIS — I83223 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 and inflammation: Secondary | ICD-10-CM | POA: Diagnosis not present

## 2017-07-10 DIAGNOSIS — I83223 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 and inflammation: Secondary | ICD-10-CM | POA: Diagnosis not present

## 2017-07-15 DIAGNOSIS — I83223 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 and inflammation: Secondary | ICD-10-CM | POA: Diagnosis not present

## 2017-07-17 DIAGNOSIS — I83223 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 and inflammation: Secondary | ICD-10-CM | POA: Diagnosis not present

## 2017-07-22 DIAGNOSIS — I83223 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 and inflammation: Secondary | ICD-10-CM | POA: Diagnosis not present

## 2017-07-24 DIAGNOSIS — I83223 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 and inflammation: Secondary | ICD-10-CM | POA: Diagnosis not present

## 2017-07-28 DIAGNOSIS — I83223 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 and inflammation: Secondary | ICD-10-CM | POA: Diagnosis not present

## 2017-07-31 ENCOUNTER — Encounter (HOSPITAL_BASED_OUTPATIENT_CLINIC_OR_DEPARTMENT_OTHER): Payer: 59 | Attending: Internal Medicine

## 2017-07-31 DIAGNOSIS — I83213 Varicose veins of right lower extremity with both ulcer of ankle and inflammation: Secondary | ICD-10-CM | POA: Insufficient documentation

## 2017-07-31 DIAGNOSIS — L97322 Non-pressure chronic ulcer of left ankle with fat layer exposed: Secondary | ICD-10-CM | POA: Insufficient documentation

## 2017-07-31 DIAGNOSIS — L97312 Non-pressure chronic ulcer of right ankle with fat layer exposed: Secondary | ICD-10-CM | POA: Insufficient documentation

## 2017-07-31 DIAGNOSIS — I83223 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 and inflammation: Secondary | ICD-10-CM | POA: Insufficient documentation

## 2017-08-04 DIAGNOSIS — I83223 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 and inflammation: Secondary | ICD-10-CM | POA: Diagnosis not present

## 2017-08-07 DIAGNOSIS — I83223 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 and inflammation: Secondary | ICD-10-CM | POA: Diagnosis not present

## 2017-08-11 DIAGNOSIS — I83223 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 and inflammation: Secondary | ICD-10-CM | POA: Diagnosis not present

## 2017-08-14 DIAGNOSIS — I83223 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 and inflammation: Secondary | ICD-10-CM | POA: Diagnosis not present

## 2017-08-18 DIAGNOSIS — I83223 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 and inflammation: Secondary | ICD-10-CM | POA: Diagnosis not present

## 2017-08-21 DIAGNOSIS — I83223 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 and inflammation: Secondary | ICD-10-CM | POA: Diagnosis not present

## 2017-08-25 DIAGNOSIS — I83223 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 and inflammation: Secondary | ICD-10-CM | POA: Diagnosis not present

## 2017-08-28 DIAGNOSIS — I83223 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 and inflammation: Secondary | ICD-10-CM | POA: Diagnosis not present

## 2017-09-01 ENCOUNTER — Encounter (HOSPITAL_BASED_OUTPATIENT_CLINIC_OR_DEPARTMENT_OTHER): Payer: 59 | Attending: Internal Medicine

## 2017-09-01 DIAGNOSIS — L97312 Non-pressure chronic ulcer of right ankle with fat layer exposed: Secondary | ICD-10-CM | POA: Diagnosis not present

## 2017-09-01 DIAGNOSIS — L97812 Non-pressure chronic ulcer of other part of right lower leg with fat layer exposed: Secondary | ICD-10-CM | POA: Diagnosis not present

## 2017-09-01 DIAGNOSIS — L739 Follicular disorder, unspecified: Secondary | ICD-10-CM | POA: Diagnosis not present

## 2017-09-01 DIAGNOSIS — L97322 Non-pressure chronic ulcer of left ankle with fat layer exposed: Secondary | ICD-10-CM | POA: Insufficient documentation

## 2017-09-04 ENCOUNTER — Encounter (HOSPITAL_BASED_OUTPATIENT_CLINIC_OR_DEPARTMENT_OTHER): Payer: 59

## 2017-09-04 DIAGNOSIS — L97322 Non-pressure chronic ulcer of left ankle with fat layer exposed: Secondary | ICD-10-CM | POA: Diagnosis not present

## 2017-09-08 DIAGNOSIS — L97322 Non-pressure chronic ulcer of left ankle with fat layer exposed: Secondary | ICD-10-CM | POA: Diagnosis not present

## 2017-09-15 DIAGNOSIS — L97322 Non-pressure chronic ulcer of left ankle with fat layer exposed: Secondary | ICD-10-CM | POA: Diagnosis not present

## 2017-09-18 DIAGNOSIS — L97322 Non-pressure chronic ulcer of left ankle with fat layer exposed: Secondary | ICD-10-CM | POA: Diagnosis not present

## 2017-09-21 IMAGING — DX DG FOOT COMPLETE 3+V*L*
3 series · 3 of 3 positions shown · non-contrast
Comparison: None.

CLINICAL DATA: Nonhealing wound left ankle

EXAM:
LEFT FOOT - COMPLETE 3+ VIEW

[foot ap]
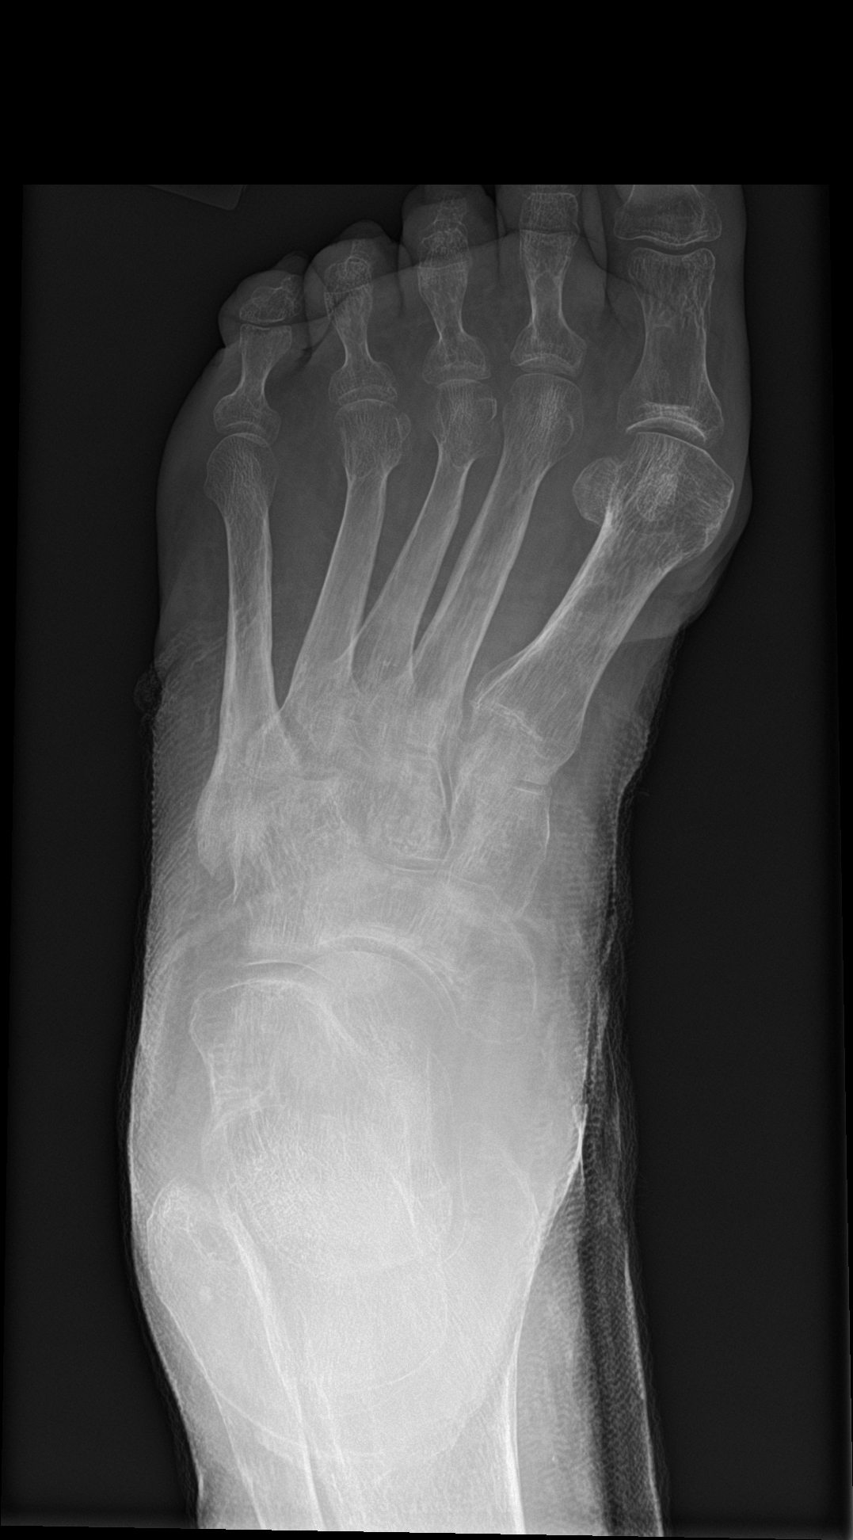

[foot obl]
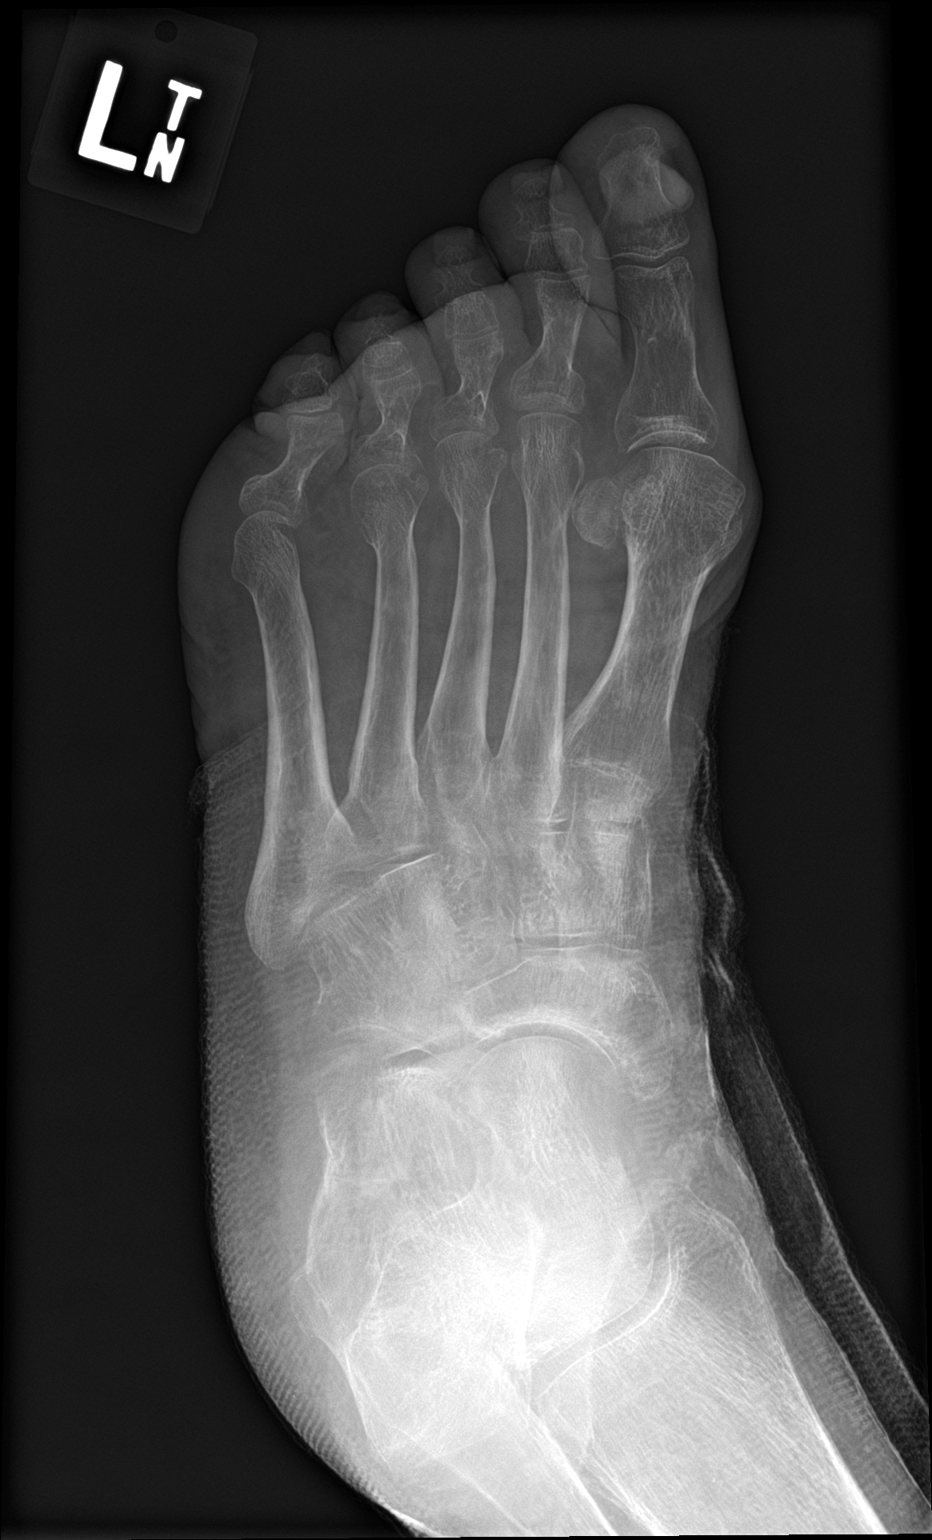

[foot lat]
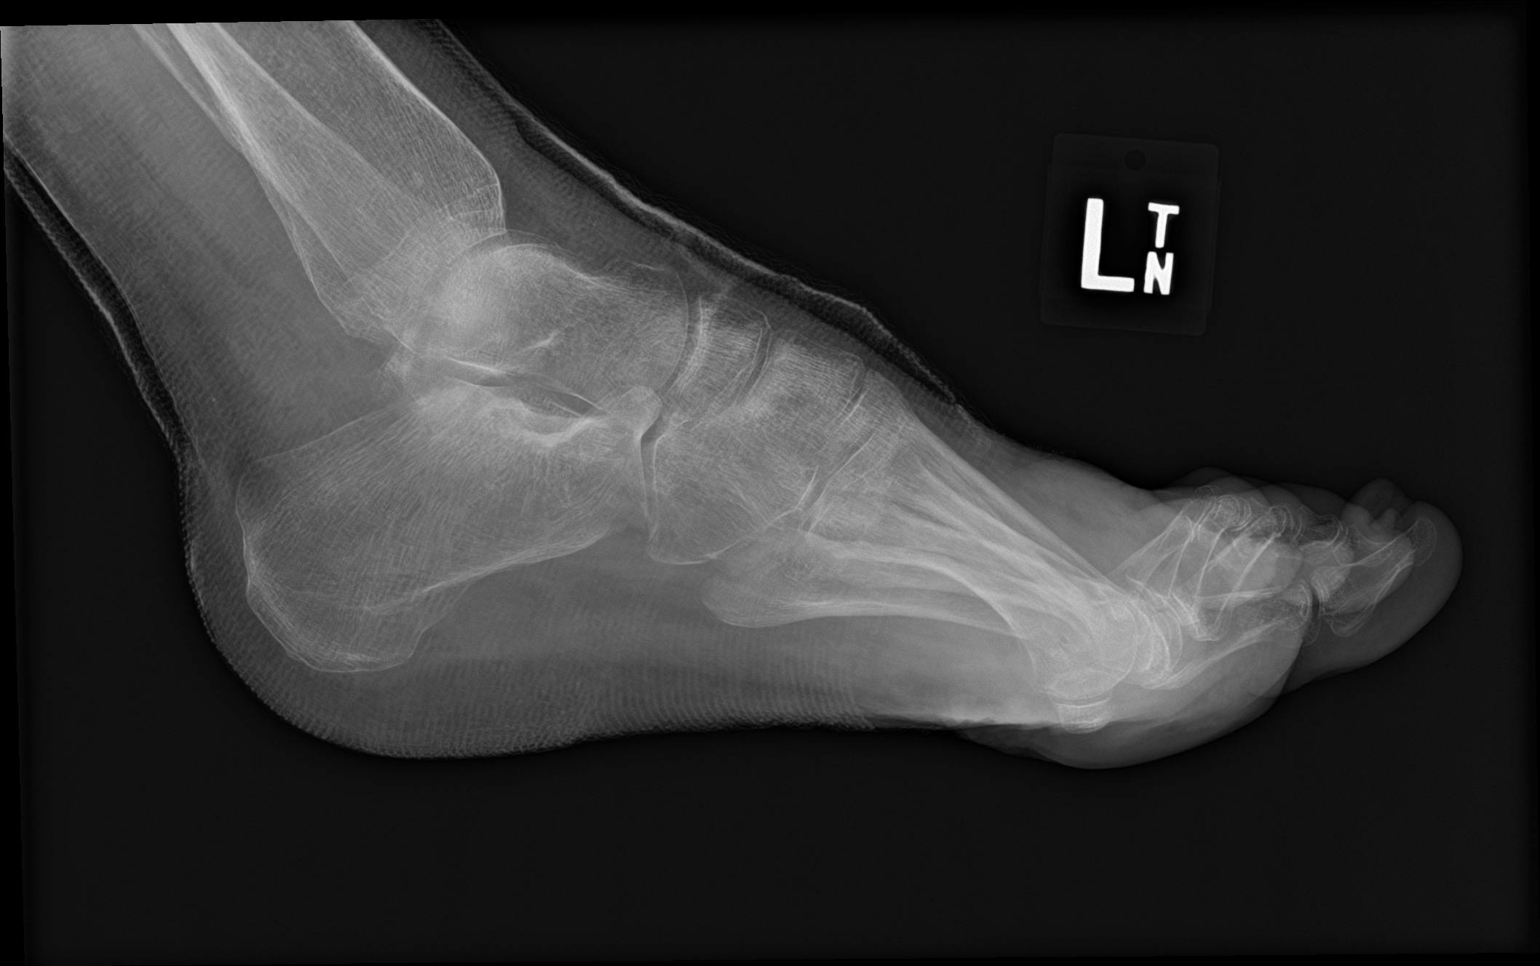

[3 of 3 positions shown; findings below may reference images not displayed]

FINDINGS: Three views of the left foot submitted. No acute fracture or
subluxation. There is diffuse osteopenia. Study is limited by
bandage material artifact. No evidence of bone destruction to
suggest osteomyelitis. No periosteal reaction or bony erosion.
IMPRESSION: No acute fracture or subluxation.  No evidence of osteomyelitis.

## 2017-09-22 DIAGNOSIS — L97322 Non-pressure chronic ulcer of left ankle with fat layer exposed: Secondary | ICD-10-CM | POA: Diagnosis not present

## 2017-09-25 DIAGNOSIS — L97322 Non-pressure chronic ulcer of left ankle with fat layer exposed: Secondary | ICD-10-CM | POA: Diagnosis not present

## 2017-09-29 DIAGNOSIS — L97322 Non-pressure chronic ulcer of left ankle with fat layer exposed: Secondary | ICD-10-CM | POA: Diagnosis not present

## 2017-10-02 ENCOUNTER — Encounter (HOSPITAL_BASED_OUTPATIENT_CLINIC_OR_DEPARTMENT_OTHER): Payer: 59 | Attending: Internal Medicine

## 2017-10-02 DIAGNOSIS — L97322 Non-pressure chronic ulcer of left ankle with fat layer exposed: Secondary | ICD-10-CM | POA: Diagnosis not present

## 2017-10-02 DIAGNOSIS — I83223 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 and inflammation: Secondary | ICD-10-CM | POA: Insufficient documentation

## 2017-10-02 DIAGNOSIS — L97312 Non-pressure chronic ulcer of right ankle with fat layer exposed: Secondary | ICD-10-CM | POA: Diagnosis not present

## 2017-10-02 DIAGNOSIS — I83213 Varicose veins of right lower extremity with both ulcer of ankle and inflammation: Secondary | ICD-10-CM | POA: Insufficient documentation

## 2017-10-06 DIAGNOSIS — I83213 Varicose veins of right lower extremity with both ulcer of ankle and inflammation: Secondary | ICD-10-CM | POA: Diagnosis not present

## 2017-10-09 DIAGNOSIS — I83213 Varicose veins of right lower extremity with both ulcer of ankle and inflammation: Secondary | ICD-10-CM | POA: Diagnosis not present

## 2017-10-13 DIAGNOSIS — I83213 Varicose veins of right lower extremity with both ulcer of ankle and inflammation: Secondary | ICD-10-CM | POA: Diagnosis not present

## 2017-10-16 DIAGNOSIS — I83213 Varicose veins of right lower extremity with both ulcer of ankle and inflammation: Secondary | ICD-10-CM | POA: Diagnosis not present

## 2017-10-20 DIAGNOSIS — I83213 Varicose veins of right lower extremity with both ulcer of ankle and inflammation: Secondary | ICD-10-CM | POA: Diagnosis not present

## 2017-10-23 DIAGNOSIS — I83213 Varicose veins of right lower extremity with both ulcer of ankle and inflammation: Secondary | ICD-10-CM | POA: Diagnosis not present

## 2017-10-27 DIAGNOSIS — I83213 Varicose veins of right lower extremity with both ulcer of ankle and inflammation: Secondary | ICD-10-CM | POA: Diagnosis not present

## 2017-10-30 DIAGNOSIS — I83213 Varicose veins of right lower extremity with both ulcer of ankle and inflammation: Secondary | ICD-10-CM | POA: Diagnosis not present

## 2017-11-03 ENCOUNTER — Encounter (HOSPITAL_BASED_OUTPATIENT_CLINIC_OR_DEPARTMENT_OTHER): Payer: 59 | Attending: Internal Medicine

## 2017-11-03 DIAGNOSIS — L97322 Non-pressure chronic ulcer of left ankle with fat layer exposed: Secondary | ICD-10-CM | POA: Diagnosis not present

## 2017-11-03 DIAGNOSIS — B9689 Other specified bacterial agents as the cause of diseases classified elsewhere: Secondary | ICD-10-CM | POA: Diagnosis not present

## 2017-11-03 DIAGNOSIS — I83223 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 and inflammation: Secondary | ICD-10-CM | POA: Diagnosis not present

## 2017-11-03 DIAGNOSIS — L97312 Non-pressure chronic ulcer of right ankle with fat layer exposed: Secondary | ICD-10-CM | POA: Diagnosis not present

## 2017-11-06 ENCOUNTER — Other Ambulatory Visit (HOSPITAL_COMMUNITY)
Admission: RE | Admit: 2017-11-06 | Discharge: 2017-11-06 | Disposition: A | Discharge status: Home / Self Care | Payer: 59 | Source: Other Acute Inpatient Hospital | Attending: Internal Medicine | Admitting: Internal Medicine

## 2017-11-06 DIAGNOSIS — I83223 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 and inflammation: Secondary | ICD-10-CM | POA: Diagnosis not present

## 2017-11-06 DIAGNOSIS — L97322 Non-pressure chronic ulcer of left ankle with fat layer exposed: Secondary | ICD-10-CM | POA: Insufficient documentation

## 2017-11-09 LAB — AEROBIC CULTURE W GRAM STAIN (SUPERFICIAL SPECIMEN): Gram Stain: NONE SEEN

## 2017-11-09 LAB — AEROBIC CULTURE  (SUPERFICIAL SPECIMEN)

## 2017-11-10 DIAGNOSIS — I83223 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 and inflammation: Secondary | ICD-10-CM | POA: Diagnosis not present

## 2017-11-11 ENCOUNTER — Other Ambulatory Visit (HOSPITAL_BASED_OUTPATIENT_CLINIC_OR_DEPARTMENT_OTHER): Payer: Self-pay | Admitting: Internal Medicine

## 2017-11-11 DIAGNOSIS — L97329 Non-pressure chronic ulcer of left ankle with unspecified severity: Principal | ICD-10-CM

## 2017-11-11 DIAGNOSIS — I83223 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 and inflammation: Secondary | ICD-10-CM

## 2017-11-11 DIAGNOSIS — L97219 Non-pressure chronic ulcer of right calf with unspecified severity: Secondary | ICD-10-CM

## 2017-11-11 DIAGNOSIS — I83212 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 and inflammation: Secondary | ICD-10-CM

## 2017-11-12 ENCOUNTER — Ambulatory Visit (HOSPITAL_COMMUNITY)
Admission: RE | Admit: 2017-11-12 | Discharge: 2017-11-12 | Disposition: A | Discharge status: Home / Self Care | Payer: 59 | Source: Ambulatory Visit | Attending: Vascular Surgery | Admitting: Vascular Surgery

## 2017-11-12 DIAGNOSIS — I83212 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 and inflammation: Secondary | ICD-10-CM | POA: Diagnosis not present

## 2017-11-12 DIAGNOSIS — L97329 Non-pressure chronic ulcer of left ankle with unspecified severity: Secondary | ICD-10-CM | POA: Diagnosis not present

## 2017-11-12 DIAGNOSIS — I83223 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 and inflammation: Secondary | ICD-10-CM | POA: Insufficient documentation

## 2017-11-12 DIAGNOSIS — L97219 Non-pressure chronic ulcer of right calf with unspecified severity: Secondary | ICD-10-CM

## 2017-11-13 DIAGNOSIS — I83223 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 and inflammation: Secondary | ICD-10-CM | POA: Diagnosis not present

## 2017-11-17 DIAGNOSIS — I83223 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 and inflammation: Secondary | ICD-10-CM | POA: Diagnosis not present

## 2017-11-20 DIAGNOSIS — I83223 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 and inflammation: Secondary | ICD-10-CM | POA: Diagnosis not present

## 2017-11-24 DIAGNOSIS — I83223 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 and inflammation: Secondary | ICD-10-CM | POA: Diagnosis not present

## 2017-11-27 DIAGNOSIS — I83223 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 and inflammation: Secondary | ICD-10-CM | POA: Diagnosis not present

## 2017-12-01 ENCOUNTER — Encounter (HOSPITAL_BASED_OUTPATIENT_CLINIC_OR_DEPARTMENT_OTHER): Payer: 59 | Attending: Internal Medicine

## 2017-12-01 DIAGNOSIS — L97312 Non-pressure chronic ulcer of right ankle with fat layer exposed: Secondary | ICD-10-CM | POA: Diagnosis not present

## 2017-12-01 DIAGNOSIS — L97322 Non-pressure chronic ulcer of left ankle with fat layer exposed: Secondary | ICD-10-CM | POA: Insufficient documentation

## 2017-12-01 DIAGNOSIS — I83212 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 and inflammation: Secondary | ICD-10-CM | POA: Insufficient documentation

## 2017-12-04 ENCOUNTER — Encounter (HOSPITAL_BASED_OUTPATIENT_CLINIC_OR_DEPARTMENT_OTHER): Payer: 59

## 2017-12-04 DIAGNOSIS — I83212 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 and inflammation: Secondary | ICD-10-CM | POA: Diagnosis not present

## 2017-12-08 DIAGNOSIS — I83212 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 and inflammation: Secondary | ICD-10-CM | POA: Diagnosis not present

## 2017-12-15 DIAGNOSIS — I83212 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 and inflammation: Secondary | ICD-10-CM | POA: Diagnosis not present

## 2017-12-18 DIAGNOSIS — I83212 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 and inflammation: Secondary | ICD-10-CM | POA: Diagnosis not present

## 2017-12-22 DIAGNOSIS — I83212 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 and inflammation: Secondary | ICD-10-CM | POA: Diagnosis not present

## 2017-12-25 DIAGNOSIS — I83212 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 and inflammation: Secondary | ICD-10-CM | POA: Diagnosis not present

## 2017-12-29 DIAGNOSIS — I83212 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 and inflammation: Secondary | ICD-10-CM | POA: Diagnosis not present

## 2017-12-30 ENCOUNTER — Ambulatory Visit: Payer: 59 | Admitting: Vascular Surgery

## 2017-12-30 ENCOUNTER — Encounter: Payer: Self-pay | Admitting: Vascular Surgery

## 2017-12-30 ENCOUNTER — Other Ambulatory Visit: Payer: Self-pay

## 2017-12-30 VITALS — BP 126/62 | HR 63 | Temp 97.0°F | Resp 18 | Ht 68.0 in | Wt 131.3 lb

## 2017-12-30 DIAGNOSIS — L97929 Non-pressure chronic ulcer of unspecified part of left lower leg with unspecified severity: Secondary | ICD-10-CM

## 2017-12-30 DIAGNOSIS — I872 Venous insufficiency (chronic) (peripheral): Secondary | ICD-10-CM | POA: Diagnosis not present

## 2017-12-30 DIAGNOSIS — I83029 Varicose veins of left lower extremity with ulcer of unspecified site: Secondary | ICD-10-CM

## 2017-12-30 DIAGNOSIS — L97919 Non-pressure chronic ulcer of unspecified part of right lower leg with unspecified severity: Secondary | ICD-10-CM | POA: Diagnosis not present

## 2017-12-30 DIAGNOSIS — I83019 Varicose veins of right lower extremity with ulcer of unspecified site: Secondary | ICD-10-CM | POA: Diagnosis not present

## 2017-12-30 NOTE — Progress Notes (Signed)
Established Venous Insufficiency   History of Present Illness   [name] is a 62 y.o. (Jan 04, 1956) female who presents with chief complaint: recurrent bilateral leg venous ulcers.  The patient's symptoms have not progressed.  The patient's symptoms are: recurrent leg ulcers.  This patient has previous had angiography done with Dr. Scot Dock to evaluate for arterial disease.  Essentially no disease was found.  The patient is currently getting wound care with [facility].  She get twice a week dressing and debridement.  The patient's PMH, PSH, SH, and FamHx were reviewed on 12/30/2017 are unchanged from 11/01/15.  Current Outpatient Medications  Medication Sig Dispense Refill  . Multiple Vitamin (MULTIVITAMIN) capsule Take 1 capsule by mouth daily.    . Omega-3 Fatty Acids (FISH OIL) 1000 MG CAPS Take by mouth.    . oxyCODONE (OXY IR/ROXICODONE) 5 MG immediate release tablet Take 1 tablet (5 mg total) by mouth every 6 (six) hours as needed for severe pain. 120 tablet 0  . SANTYL ointment     . vitamin C (ASCORBIC ACID) 500 MG tablet Take 500 mg by mouth daily.     No current facility-administered medications for this visit.     Allergies  Allergen Reactions  . Penicillins Hives and Itching    On ROS today: recurrent slowly healing B VSU, prior skin graft to L leg   Physical Examination   Vitals:   12/30/17 1259  BP: 126/62  Pulse: 63  Resp: 18  Temp: (!) 97 F (36.1 C)  TempSrc: Oral  SpO2: 100%  Weight: 131 lb 4.8 oz (59.6 kg)  Height: 5\' 8"  (1.727 m)   Body mass index is 19.96 kg/m.  General Alert, O x 3, WD, NAD  Pulmonary Sym exp, good B air movt, CTA B  Cardiac RRR, Nl S1, S2, no Murmurs, No rubs, No S3,S4  Vascular Vessel Right Left  Radial Palpable Palpable  Brachial Palpable Palpable  Carotid Palpable, No Bruit Palpable, No Bruit  Aorta Not palpable N/A  Femoral Palpable Palpable  Popliteal Not palpable Not palpable  PT Not examined due to  ulcer Not examined due to ulcer  DP Palpable Palpable    Gastro- intestinal soft, non-distended, non-tender to palpation, No guarding or rebound, no HSM, no masses, no CVAT B, No palpable prominent aortic pulse,    Musculo- skeletal M/S 5/5 throughout  , Extremities without ischemic changes  , No edema present, clean ulcer overlying medial malleolus on both sides extending onto foot: good granulation with minimal serous drainage, varus rotation of both ankles (L>>R)  Neurologic Pain and light touch intact in extremities , Motor exam as listed above     Non-Invasive Vascular Imaging   BLE Venous Insufficiency Duplex (11/12/17):   RLE:   no DVT and SVT,   no GSV  no SSV reflux,  + deep venous reflux: CFV, Pop V  + Ant. Accessory vein reflux  LLE:  no DVT and SVT,   no GSV reflux,   no SSV reflux,  + deep venous reflux: CFV, PV   Medical Decision Making   [name] is a 62 y.o. female who presents with: Known chronic VSU, BLE leg chronic venous insufficiency (C 6)   Both wounds like ready for grafting with skin vs Apligraf.  Apparently there are insurance issues now with bioengineered skin substitutes.  This patient has bilateral deep venous reflux along with reflux in a anterior access saphenous.  I doubt striping the accessory  is going to add much more than chronic compression in this patient.  I would continue compressive therapy with B unna boots in the wound center.  Follow up in 3 months with Dr. Scot Dock.  If she isn't healed in 3 months, consider referral back to [facility].  Patient previously was in a randomized trial there for what I think was placental extract, which has been shown to have some benefits for wound healing.  Once healed, pt needs to stay in compression.  Thank you for allowing [place] to participate in this patient's care.   Adele Barthel, MD, FACS [facility] Office: [phone] Pager:  [phone]

## 2018-01-05 ENCOUNTER — Encounter (HOSPITAL_BASED_OUTPATIENT_CLINIC_OR_DEPARTMENT_OTHER): Payer: 59 | Attending: Internal Medicine

## 2018-01-05 DIAGNOSIS — L97322 Non-pressure chronic ulcer of left ankle with fat layer exposed: Secondary | ICD-10-CM | POA: Insufficient documentation

## 2018-01-05 DIAGNOSIS — I83213 Varicose veins of right lower extremity with both ulcer of ankle and inflammation: Secondary | ICD-10-CM | POA: Insufficient documentation

## 2018-01-05 DIAGNOSIS — E1022 Type 1 diabetes mellitus with diabetic chronic kidney disease: Secondary | ICD-10-CM | POA: Insufficient documentation

## 2018-01-05 DIAGNOSIS — F17210 Nicotine dependence, cigarettes, uncomplicated: Secondary | ICD-10-CM | POA: Diagnosis not present

## 2018-01-05 DIAGNOSIS — N186 End stage renal disease: Secondary | ICD-10-CM | POA: Insufficient documentation

## 2018-01-05 DIAGNOSIS — J449 Chronic obstructive pulmonary disease, unspecified: Secondary | ICD-10-CM | POA: Diagnosis not present

## 2018-01-05 DIAGNOSIS — L97312 Non-pressure chronic ulcer of right ankle with fat layer exposed: Secondary | ICD-10-CM | POA: Diagnosis not present

## 2018-01-08 DIAGNOSIS — I83213 Varicose veins of right lower extremity with both ulcer of ankle and inflammation: Secondary | ICD-10-CM | POA: Diagnosis not present

## 2018-01-13 DIAGNOSIS — I83213 Varicose veins of right lower extremity with both ulcer of ankle and inflammation: Secondary | ICD-10-CM | POA: Diagnosis not present

## 2018-01-14 DIAGNOSIS — I83213 Varicose veins of right lower extremity with both ulcer of ankle and inflammation: Secondary | ICD-10-CM | POA: Diagnosis not present

## 2018-01-21 DIAGNOSIS — I83213 Varicose veins of right lower extremity with both ulcer of ankle and inflammation: Secondary | ICD-10-CM | POA: Diagnosis not present

## 2018-01-28 DIAGNOSIS — I83213 Varicose veins of right lower extremity with both ulcer of ankle and inflammation: Secondary | ICD-10-CM | POA: Diagnosis not present

## 2018-02-05 ENCOUNTER — Encounter (HOSPITAL_BASED_OUTPATIENT_CLINIC_OR_DEPARTMENT_OTHER): Payer: 59 | Attending: Internal Medicine

## 2018-02-05 DIAGNOSIS — E1022 Type 1 diabetes mellitus with diabetic chronic kidney disease: Secondary | ICD-10-CM | POA: Diagnosis not present

## 2018-02-05 DIAGNOSIS — I83212 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 and inflammation: Secondary | ICD-10-CM | POA: Insufficient documentation

## 2018-02-05 DIAGNOSIS — N186 End stage renal disease: Secondary | ICD-10-CM | POA: Insufficient documentation

## 2018-02-05 DIAGNOSIS — L97812 Non-pressure chronic ulcer of other part of right lower leg with fat layer exposed: Secondary | ICD-10-CM | POA: Insufficient documentation

## 2018-02-05 DIAGNOSIS — J449 Chronic obstructive pulmonary disease, unspecified: Secondary | ICD-10-CM | POA: Diagnosis not present

## 2018-02-05 DIAGNOSIS — L97312 Non-pressure chronic ulcer of right ankle with fat layer exposed: Secondary | ICD-10-CM | POA: Diagnosis not present

## 2018-02-05 DIAGNOSIS — L97322 Non-pressure chronic ulcer of left ankle with fat layer exposed: Secondary | ICD-10-CM | POA: Insufficient documentation

## 2018-02-12 DIAGNOSIS — I83212 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 and inflammation: Secondary | ICD-10-CM | POA: Diagnosis not present

## 2018-02-19 DIAGNOSIS — I83212 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 and inflammation: Secondary | ICD-10-CM | POA: Diagnosis not present

## 2018-02-26 DIAGNOSIS — I83212 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 and inflammation: Secondary | ICD-10-CM | POA: Diagnosis not present

## 2018-03-03 ENCOUNTER — Encounter: Payer: Self-pay | Admitting: Vascular Surgery

## 2018-03-03 ENCOUNTER — Ambulatory Visit: Payer: 59 | Admitting: Vascular Surgery

## 2018-03-03 VITALS — BP 106/59 | HR 56 | Temp 97.6°F | Resp 16 | Ht 68.0 in | Wt 133.0 lb

## 2018-03-03 DIAGNOSIS — L97929 Non-pressure chronic ulcer of unspecified part of left lower leg with unspecified severity: Secondary | ICD-10-CM | POA: Diagnosis not present

## 2018-03-03 DIAGNOSIS — I83029 Varicose veins of left lower extremity with ulcer of unspecified site: Secondary | ICD-10-CM

## 2018-03-03 DIAGNOSIS — L97919 Non-pressure chronic ulcer of unspecified part of right lower leg with unspecified severity: Secondary | ICD-10-CM | POA: Diagnosis not present

## 2018-03-03 DIAGNOSIS — I83019 Varicose veins of right lower extremity with ulcer of unspecified site: Secondary | ICD-10-CM

## 2018-03-03 NOTE — Progress Notes (Signed)
Patient name: [name] MRN: [id number] DOB: 1956-05-15 Sex: female  REASON FOR VISIT:   Follow-up of chronic venous insufficiency.  HPI:   [name] is a pleasant 62 y.o. female who was last seen by Dr. Adele Barthel on 12/30/2017.  Patient has a history of venous stasis ulcers and bilateral chronic venous insufficiency.  I had previously performed an arteriogram back in May 2017 that showed no evidence of significant arterial disease and I felt that the venous stasis ulcers could be treated aggressively with elevation and compression therapy.  The patient tells me that the wound on the right medial malleolus has almost completely healed with aggressive wound care by the wound care center at [facility] long.  The left medial malleolar wound is also improved significantly.  She has been wearing Unna boots and Dr. Dellia Nims has also been using silver nitrate on the wounds.  She denies any history of claudication or rest pain.  Past Medical History:  Diagnosis Date  . Allergy   . Ankle wound 02/2016   bilateral  . Dental bridge present    lower  . Dental crowns present    upper right  . Open wounds involving multiple regions of lower extremity   . Peripheral vascular disease (HCC)    venous stasis ulcers bilateral lower extremities  . PONV (postoperative nausea and vomiting)     Family History  Problem Relation Age of Onset  . Hypertension Mother   . Lung cancer Father   . Diabetes Maternal Grandmother   . Diabetes Paternal Grandmother   . Healthy Brother     SOCIAL HISTORY: Social History   Tobacco Use  . Smoking status: Former Smoker    Packs/day: 0.25    Years: 26.00    Pack years: 6.50    Types: Cigarettes    Last attempt to quit: 04/25/2007    Years since quitting: 10.8  . Smokeless tobacco: Never Used  Substance Use Topics  . Alcohol use: Yes    Alcohol/week: 0.0 standard drinks    Comment: occasionally    Allergies  Allergen Reactions  . Penicillins Hives  and Itching    Current Outpatient Medications  Medication Sig Dispense Refill  . Multiple Vitamin (MULTIVITAMIN) capsule Take 1 capsule by mouth daily.    . Omega-3 Fatty Acids (FISH OIL) 1000 MG CAPS Take by mouth.    . SANTYL ointment     . vitamin C (ASCORBIC ACID) 500 MG tablet Take 500 mg by mouth daily.    Marland Kitchen oxyCODONE (OXY IR/ROXICODONE) 5 MG immediate release tablet Take 1 tablet (5 mg total) by mouth every 6 (six) hours as needed for severe pain. 120 tablet 0   No current facility-administered medications for this visit.     REVIEW OF SYSTEMS:  [X]  denotes positive finding, [ ]  denotes negative finding Cardiac  Comments:  Chest pain or chest pressure:    Shortness of breath upon exertion:    Short of breath when lying flat:    Irregular heart rhythm:        Vascular    Pain in calf, thigh, or hip brought on by ambulation:    Pain in feet at night that wakes you up from your sleep:  x   Blood clot in your veins:    Leg swelling:         Pulmonary    Oxygen at home:    Productive cough:     Wheezing:  Neurologic    Sudden weakness in arms or legs:     Sudden numbness in arms or legs:     Sudden onset of difficulty speaking or slurred speech:    Temporary loss of vision in one eye:     Problems with dizziness:         Gastrointestinal    Blood in stool:     Vomited blood:         Genitourinary    Burning when urinating:     Blood in urine:        Psychiatric    Major depression:         Hematologic    Bleeding problems:    Problems with blood clotting too easily:        Skin    Rashes or ulcers:        Constitutional    Fever or chills:     PHYSICAL EXAM:   Vitals:   03/03/18 1316  BP: (!) 106/59  Pulse: (!) 56  Resp: 16  Temp: 97.6 F (36.4 C)  SpO2: 100%  Weight: 133 lb (60.3 kg)  Height: 5\' 8"  (1.727 m)    GENERAL: The patient is a well-nourished female, in no acute distress. The vital signs are documented above. CARDIAC:  There is a regular rate and rhythm.  VASCULAR: I do not detect carotid bruits. She has palpable dorsalis pedis pulses and biphasic dorsalis pedis signals with the Doppler.  I cannot assess the posterior tibial pulses because of her Unna boot which is in place. PULMONARY: There is good air exchange bilaterally without wheezing or rales. ABDOMEN: Soft and non-tender with normal pitched bowel sounds.  MUSCULOSKELETAL: There are no major deformities or cyanosis. NEUROLOGIC: No focal weakness or paresthesias are detected. SKIN: I am unable to assess her venous ulcers because she has her Unna boots in place which are to be changed on Friday. PSYCHIATRIC: The patient has a normal affect.  DATA:    VENOUS DUPLEX: I did review the venous duplex scan that was done on 11/13/2017.  On the right side there was no evidence of DVT or superficial venous thrombosis.  There was reflux in the deep system involving the common femoral vein and popliteal vein.  There was superficial reflux in an anterior accessory vein. On the left side there was no evidence of DVT or superficial thrombophlebitis.  There was reflux noted in the common femoral vein and popliteal vein.  There was no significant superficial venous reflux.   MEDICAL ISSUES:   VENOUS STASIS ULCERS: This patient continues to make significant improvement with her venous stasis ulcers.  Based on her previous arteriogram she should have adequate circulation for healing.  Also she has deep venous reflux but really no significant correctable superficial venous reflux at this time.  Therefore I would simply continue with conservative treatment including aggressive wound care, proper leg elevation, and compression therapy.  Once her wounds are healed I have written a prescription for knee-high compression stockings with a gradient of 20 to 30 mmHg.  We have also discussed the importance of avoiding prolonged sitting and standing.  I will be happy to see her back at  any time if her venous ulcers return or progress.  Deitra Mayo [facility] [phone]

## 2018-03-05 ENCOUNTER — Encounter (HOSPITAL_BASED_OUTPATIENT_CLINIC_OR_DEPARTMENT_OTHER): Payer: 59 | Attending: Internal Medicine

## 2018-03-05 DIAGNOSIS — I739 Peripheral vascular disease, unspecified: Secondary | ICD-10-CM | POA: Diagnosis not present

## 2018-03-05 DIAGNOSIS — J449 Chronic obstructive pulmonary disease, unspecified: Secondary | ICD-10-CM | POA: Insufficient documentation

## 2018-03-05 DIAGNOSIS — F17200 Nicotine dependence, unspecified, uncomplicated: Secondary | ICD-10-CM | POA: Insufficient documentation

## 2018-03-05 DIAGNOSIS — L97312 Non-pressure chronic ulcer of right ankle with fat layer exposed: Secondary | ICD-10-CM | POA: Insufficient documentation

## 2018-03-05 DIAGNOSIS — L97322 Non-pressure chronic ulcer of left ankle with fat layer exposed: Secondary | ICD-10-CM | POA: Insufficient documentation

## 2018-03-05 DIAGNOSIS — E1022 Type 1 diabetes mellitus with diabetic chronic kidney disease: Secondary | ICD-10-CM | POA: Insufficient documentation

## 2018-03-05 DIAGNOSIS — E1051 Type 1 diabetes mellitus with diabetic peripheral angiopathy without gangrene: Secondary | ICD-10-CM | POA: Insufficient documentation

## 2018-03-05 DIAGNOSIS — L97812 Non-pressure chronic ulcer of other part of right lower leg with fat layer exposed: Secondary | ICD-10-CM | POA: Diagnosis not present

## 2018-03-05 DIAGNOSIS — I83218 Varicose veins of right lower extremity with both ulcer of other part of lower extremity and inflammation: Secondary | ICD-10-CM | POA: Insufficient documentation

## 2018-03-05 DIAGNOSIS — N186 End stage renal disease: Secondary | ICD-10-CM | POA: Diagnosis not present

## 2018-03-08 ENCOUNTER — Emergency Department (HOSPITAL_COMMUNITY): Payer: 59

## 2018-03-08 ENCOUNTER — Emergency Department (HOSPITAL_COMMUNITY)
Admission: EM | Admit: 2018-03-08 | Discharge: 2018-03-08 | Disposition: A | Discharge status: Home / Self Care | Payer: 59 | Attending: Emergency Medicine | Admitting: Emergency Medicine

## 2018-03-08 ENCOUNTER — Encounter (HOSPITAL_COMMUNITY): Payer: Self-pay | Admitting: Emergency Medicine

## 2018-03-08 DIAGNOSIS — W010XXA Fall on same level from slipping, tripping and stumbling without subsequent striking against object, initial encounter: Secondary | ICD-10-CM | POA: Insufficient documentation

## 2018-03-08 DIAGNOSIS — Y92014 Private driveway to single-family (private) house as the place of occurrence of the external cause: Secondary | ICD-10-CM | POA: Insufficient documentation

## 2018-03-08 DIAGNOSIS — S3992XA Unspecified injury of lower back, initial encounter: Secondary | ICD-10-CM | POA: Diagnosis present

## 2018-03-08 DIAGNOSIS — Y998 Other external cause status: Secondary | ICD-10-CM | POA: Diagnosis not present

## 2018-03-08 DIAGNOSIS — S3210XA Unspecified fracture of sacrum, initial encounter for closed fracture: Secondary | ICD-10-CM | POA: Insufficient documentation

## 2018-03-08 DIAGNOSIS — Y939 Activity, unspecified: Secondary | ICD-10-CM | POA: Insufficient documentation

## 2018-03-08 MED ORDER — LORAZEPAM 1 MG PO TABS
1.0000 mg | ORAL_TABLET | Freq: Once | ORAL | Status: AC
  Administered 2018-03-08: 1 mg via ORAL
  Filled 2018-03-08: qty 1

## 2018-03-08 MED ORDER — HYDROMORPHONE HCL 1 MG/ML IJ SOLN
0.7500 mg | Freq: Once | INTRAMUSCULAR | Status: AC
  Administered 2018-03-08: 0.75 mg via INTRAMUSCULAR
  Filled 2018-03-08: qty 1

## 2018-03-08 MED ORDER — KETOROLAC TROMETHAMINE 15 MG/ML IJ SOLN
15.0000 mg | Freq: Once | INTRAMUSCULAR | Status: AC
  Administered 2018-03-08: 15 mg via INTRAMUSCULAR
  Filled 2018-03-08: qty 1

## 2018-03-08 MED ORDER — CYCLOBENZAPRINE HCL 5 MG PO TABS: 5.0000 mg | ORAL_TABLET | Freq: Three times a day (TID) | ORAL | 0 refills | Status: DC | PRN

## 2018-03-08 MED ORDER — OXYCODONE-ACETAMINOPHEN 5-325 MG PO TABS: 1.0000 | ORAL_TABLET | Freq: Four times a day (QID) | ORAL | 0 refills | Status: DC | PRN

## 2018-03-08 MED ORDER — IBUPROFEN 600 MG PO TABS: 600.0000 mg | ORAL_TABLET | Freq: Four times a day (QID) | ORAL | 0 refills | Status: DC | PRN

## 2018-03-08 NOTE — ED Notes (Signed)
Paged [facility]/OLIN

## 2018-03-08 NOTE — ED Notes (Signed)
Patient transported to X-ray 

## 2018-03-08 NOTE — ED Notes (Signed)
Patient transported to CT 

## 2018-03-08 NOTE — ED Provider Notes (Signed)
5:38 PM Patient and husband aware of CT results. Given the description of the injury, comminuted sacral fracture I also discussed with her orthopedic team. Patient is appropriate for discharge, has a walker at home, has been prescribed pain medication. She is aware of the importance of following up.   Carmin Muskrat, MD 03/08/18 1739

## 2018-03-08 NOTE — ED Notes (Signed)
ED Provider at bedside. 

## 2018-03-08 NOTE — ED Triage Notes (Signed)
Fell in driveway this am at [phone], landing on butt and back. States cannot walk and has severe pain radiating down right leg,  Has been seeing vascular and vein specialist for lower legs.  Pt appears uncomfortable in w/c- hurts to sit flat.

## 2018-03-08 NOTE — ED Provider Notes (Signed)
[name] EM[facility] DEPARTMENT Provider Note   CSN: [id number] Arrival date & time: 03/08/18  1158     History   Chief Complaint Chief Complaint  Patient presents with  . Back Injury    HPI [name] is a 62 y.o. female.  HPI   62 year old female with lower back pain.  Patient fell between 930 and 10 AM this morning in her driveway.  She fell backwards onto her butt/lower back.  She has had significant pain there since this time.  She can bear some weight although the pain is markedly increased with this.  She is having some pain radiating into both of her thighs, right greater than left.  She is not anticoagulated.  She denies any significant past history of back problems.  Past Medical History:  Diagnosis Date  . Allergy   . Ankle wound 02/2016   bilateral  . Dental bridge present    lower  . Dental crowns present    upper right  . Open wounds involving multiple regions of lower extremity   . Peripheral vascular disease (HCC)    venous stasis ulcers bilateral lower extremities  . PONV (postoperative nausea and vomiting)     Patient Active Problem List   Diagnosis Date Noted  . Opiate use 04/27/2017  . Pharmacologic therapy 04/27/2017  . Problems influencing health status 04/27/2017  . Chronic Venous stasis ulcers of lower extremities (Bilateral) (L>R) 04/27/2017  . 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 Chronic low back pain 04/27/2017  . Chronic pain syndrome 04/26/2017  . Chronic knee pain [facility] Area of Pain) (Bilateral) (L>R) 04/26/2017  . Chronic lower extremity pain (Secondary Area of Pain) (Bilateral) (L>R) 04/07/2017  . Chronic ankle pain (Primary Area of Pain) (Bilateral) (L>R) 04/07/2017  . Disorder of skeletal system 04/07/2017  . Long term current use of opiate analgesic 04/07/2017  . Status post split thickness skin graft 03/17/2016  . Open wound of knee, leg, and ankle 01/07/2016  . Venous insufficiency      Past Surgical History:  Procedure Laterality Date  . APPLICATION OF WOUND VAC Bilateral 12/12/2015   Procedure: APPLICATION OF WOUND VAC;  Surgeon: [name], DO;  Location: [place];  Service: Plastics;  Laterality: Bilateral;  . DRESSING CHANGE UNDER ANESTHESIA Right 02/14/2016   Procedure: DRESSING CHANGE UNDER ANESTHESIA;  Surgeon: [name], DO;  Location: [place];  Service: Plastics;  Laterality: Right;  . [place]  . I&D EXTREMITY Bilateral 12/12/2015   Procedure: DEBRIDEMENT OF BILATERAL LEG WOUNDS;  Surgeon: [name], DO;  Location: [place];  Service: Plastics;  Laterality: Bilateral;  . I&D EXTREMITY Bilateral 01/07/2016   Procedure: IRRIGATION AND DEBRIDEMENT OF BILATERAL LEGS;  Surgeon: [name], DO;  Location: [place];  Service: Plastics;  Laterality: Bilateral;  . PERIPHERAL VASCULAR CATHETERIZATION N/A 10/15/2015   Procedure: Abdominal Aortogram w/Lower Extremity;  Surgeon: Angelia Mould, MD;  Location: [place] [facility];  Service: Cardiovascular;  Laterality: N/A;  . skin graph    . TONSILLECTOMY     as a child  . VEIN LIGATION AND STRIPPING Left late 1990's  . WOUND DEBRIDEMENT Left 02/14/2016   Procedure: SKIN GRAFT SPLIT THICKNESS TO LEFT ANKLE;  Surgeon: [name], DO;  Location: [place];  Service: Plastics;  Laterality: Left;     OB History   None  Home Medications    Prior to Admission medications   Medication Sig Start Date End Date Taking? Authorizing Provider  Multiple Vitamin (MULTIVITAMIN) capsule Take 1 capsule by mouth daily.    [provider]  Omega-3 Fatty Acids (FISH OIL) 1000 MG CAPS Take by mouth.    [provider]  oxyCODONE (OXY IR/ROXICODONE) 5 MG immediate release tablet Take 1 tablet (5 mg total) by mouth every 6 (six) hours as needed for severe pain.  04/27/17 05/27/17  Milinda Pointer, MD  SANTYL ointment  02/12/17   [provider]  vitamin C (ASCORBIC ACID) 500 MG tablet Take 500 mg by mouth daily.    [provider]    Family History Family History  Problem Relation Age of Onset  . Hypertension Mother   . Lung cancer Father   . Diabetes Maternal Grandmother   . Diabetes Paternal Grandmother   . Healthy Brother     Social History Social History   Tobacco Use  . Smoking status: Former Smoker    Packs/day: 0.25    Years: 26.00    Pack years: 6.50    Types: Cigarettes    Last attempt to quit: 04/25/2007    Years since quitting: 10.8  . Smokeless tobacco: Never Used  Substance Use Topics  . Alcohol use: Yes    Alcohol/week: 0.0 standard drinks    Comment: occasionally  . Drug use: No     Allergies   Penicillins   Review of Systems Review of Systems  All systems reviewed and negative, other than as noted in HPI.  Physical Exam Updated Vital Signs BP 91/60 (BP Location: Left Arm)   Pulse 68   Temp 97.8 F (36.6 C) (Oral)   Resp (!) 22   Ht 5\' 8"  (1.727 m)   Wt 60.3 kg   SpO2 100%   BMI 20.21 kg/m   Physical Exam  Constitutional: She appears well-developed and well-nourished. No distress.  HENT:  Head: Normocephalic and atraumatic.  Eyes: Conjunctivae are normal. Right eye exhibits no discharge. Left eye exhibits no discharge.  Neck: Neck supple.  Cardiovascular: Normal rate, regular rhythm and normal heart sounds. Exam reveals no gallop and no friction rub.  No murmur heard. Pulmonary/Chest: Effort normal and breath sounds normal. No respiratory distress.  Abdominal: Soft. She exhibits no distension. There is no tenderness.  Musculoskeletal: She exhibits no edema or tenderness.  Tenderness to palpation in the lower lumbar spine/sacrum.  Tender both in the midline and paraspinally.  She can range bilateral lower extremities although she has increased pain with leg movement.     Neurological: She is alert.  Skin: Skin is warm and dry.  Psychiatric: She has a normal mood and affect. Her behavior is normal. Thought content normal.  Nursing note and vitals reviewed.    ED Treatments / Results  Labs (all labs ordered are listed, but only abnormal results are displayed) Labs Reviewed - No data to display  EKG None  Radiology No results found.   Dg Lumbar Spine Complete  Result Date: 03/08/2018 CLINICAL DATA:  Fall this morning with low back and buttock pain, initial encounter EXAM: LUMBAR SPINE - COMPLETE 4+ VIEW COMPARISON:  07/12/2010 CT of the abdomen and pelvis FINDINGS: Five lumbar type vertebral bodies are well visualized. Vertebral body height is well maintained. Minimal degenerative anterolisthesis of L4 on L5 is noted. No pars defects are seen. No acute soft tissue abnormality is noted. IMPRESSION: Degenerative change without acute abnormality. Electronically Signed  By: Inez Catalina M.D.   On: 03/08/2018 14:07   Dg Sacrum/coccyx  Result Date: 03/08/2018 CLINICAL DATA:  Recent fall with low back pain, initial encounter EXAM: SACRUM AND COCCYX - 2+ VIEW COMPARISON:  07/12/2010 FINDINGS: Pelvic ring appears intact. There is some acute angulation at the junction of the S1 and S2 segments. Possibility of a mildly displaced fracture could not be totally excluded. This is new from the prior CT examination. No other definitive bony abnormality is noted aside from a posterior fusion defect at S1. No soft tissue changes are seen. IMPRESSION: Questionable angulated fracture in the sacrum as described. CT may be helpful for further evaluation as clinically indicated. Electronically Signed   By: Inez Catalina M.D.   On: 03/08/2018 14:09   Ct Pelvis Wo Contrast  Result Date: 03/08/2018 CLINICAL DATA:  Severe pelvic pain radiating down both legs after falling in her driveway and landing on her buttocks today. Possible sacral fracture on sacrococcygeal radiographs  obtained earlier today. EXAM: CT PELVIS WITHOUT CONTRAST TECHNIQUE: Multidetector CT imaging of the pelvis was performed following the standard protocol without intravenous contrast. COMPARISON:  Sacrococcygeal radiographs obtained earlier today. Abdomen and pelvis CT dated 07/12/2010. FINDINGS: Urinary Tract:  No abnormality visualized. Bowel: Multiple sigmoid colon diverticula. Unremarkable small bowel loops. Vascular/Lymphatic: No pathologically enlarged lymph nodes. No significant vascular abnormality seen. Reproductive:  No mass or other significant abnormality Other:  Left groin surgical clips. Musculoskeletal: There is a comminuted fracture of the upper sacrum, including the posterior margin of the S1 vertebra on the right and right sacral ala. There is also buckled anterior fragmentation at the S1-2 level, corresponding to the abnormal appearance on the recent radiographs. There is less pronounced involvement of the left sacral ala and posterior elements on the right. Rounded, circumscribed, mass-like area of soft tissue density in the S2 neural foramina on the right, measuring 2.3 x 2.0 cm on image number 48 series 5 and 28 Hounsfield units in density. This measures 2.6 cm in length on sagittal image number 87. The foramen is expanded with corticated margins. IMPRESSION: 1. Comminuted fracture of the upper sacrum, including the posterior margin of the S1 vertebra on the right and right sacral ala, with less pronounced involvement of the posterior elements on the right and left sacral ala. 2. 2.3 x 2.0 x 2.6 cm mass-like area of soft tissue density in the S2 neural foramina on the right with bony expansion with corticated margins. 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 Recommend further evaluated with elective outpatient MRI of the sacrum without and with contrast. 3. Sigmoid diverticulosis. Electronically Signed   By: Claudie Revering M.D.   On: 03/08/2018 16:47    Procedures Procedures  (including critical care time)  Medications Ordered in ED Medications  HYDROmorphone (DILAUDID) injection 0.75 mg (0.75 mg Intramuscular Given 03/08/18 1325)  ketorolac (TORADOL) 15 MG/ML injection 15 mg (15 mg Intramuscular Given 03/08/18 1325)  LORazepam (ATIVAN) tablet 1 mg (1 mg Oral Given 03/08/18 1325)     Initial Impression / Assessment and Plan / ED Course  I have reviewed the triage vital signs and the nursing notes.  Pertinent labs & imaging results that were available during my care of the patient were reviewed by me and considered in my medical decision making (see chart for details).     62 year old female with lower back/sacral pain after a fall.  Plain films suggestive of sacral fracture.  Clinically suspect that this is real.  Will  CT to further evaluate. Neuro exam non-focal. Pain meds.   Final Clinical Impressions(s) / ED Diagnoses   Final diagnoses:  Closed fracture of sacrum, unspecified portion of sacrum, initial encounter [facility])    ED Discharge Orders    None       Virgel Manifold, MD 03/12/18 2349

## 2018-03-24 ENCOUNTER — Other Ambulatory Visit: Payer: Self-pay | Admitting: Orthopedic Surgery

## 2018-03-24 DIAGNOSIS — IMO0002 Reserved for concepts with insufficient information to code with codable children: Secondary | ICD-10-CM

## 2018-03-24 DIAGNOSIS — R229 Localized swelling, mass and lump, unspecified: Principal | ICD-10-CM

## 2018-04-02 ENCOUNTER — Encounter (HOSPITAL_BASED_OUTPATIENT_CLINIC_OR_DEPARTMENT_OTHER): Payer: 59 | Attending: Internal Medicine

## 2018-04-02 DIAGNOSIS — E1022 Type 1 diabetes mellitus with diabetic chronic kidney disease: Secondary | ICD-10-CM | POA: Diagnosis not present

## 2018-04-02 DIAGNOSIS — I509 Heart failure, unspecified: Secondary | ICD-10-CM | POA: Diagnosis not present

## 2018-04-02 DIAGNOSIS — N186 End stage renal disease: Secondary | ICD-10-CM | POA: Diagnosis not present

## 2018-04-02 DIAGNOSIS — L97322 Non-pressure chronic ulcer of left ankle with fat layer exposed: Secondary | ICD-10-CM | POA: Diagnosis not present

## 2018-04-02 DIAGNOSIS — E1051 Type 1 diabetes mellitus with diabetic peripheral angiopathy without gangrene: Secondary | ICD-10-CM | POA: Insufficient documentation

## 2018-04-02 DIAGNOSIS — I83213 Varicose veins of right lower extremity with both ulcer of ankle and inflammation: Secondary | ICD-10-CM | POA: Insufficient documentation

## 2018-04-02 DIAGNOSIS — L97312 Non-pressure chronic ulcer of right ankle with fat layer exposed: Secondary | ICD-10-CM | POA: Diagnosis not present

## 2018-04-02 DIAGNOSIS — J449 Chronic obstructive pulmonary disease, unspecified: Secondary | ICD-10-CM | POA: Diagnosis not present

## 2018-04-03 ENCOUNTER — Other Ambulatory Visit: Payer: 59

## 2018-04-07 ENCOUNTER — Ambulatory Visit
Admission: RE | Admit: 2018-04-07 | Discharge: 2018-04-07 | Disposition: A | Discharge status: Home / Self Care | Payer: 59 | Source: Ambulatory Visit | Attending: Orthopedic Surgery | Admitting: Orthopedic Surgery

## 2018-04-07 DIAGNOSIS — R229 Localized swelling, mass and lump, unspecified: Principal | ICD-10-CM

## 2018-04-07 DIAGNOSIS — IMO0002 Reserved for concepts with insufficient information to code with codable children: Secondary | ICD-10-CM

## 2018-04-07 MED ORDER — GADOBENATE DIMEGLUMINE 529 MG/ML IV SOLN
10.0000 mL | Freq: Once | INTRAVENOUS | Status: AC | PRN
  Administered 2018-04-07: 10 mL via INTRAVENOUS

## 2018-04-08 ENCOUNTER — Other Ambulatory Visit: Payer: 59

## 2018-04-16 DIAGNOSIS — I83213 Varicose veins of right lower extremity with both ulcer of ankle and inflammation: Secondary | ICD-10-CM | POA: Diagnosis not present

## 2018-05-07 ENCOUNTER — Encounter (HOSPITAL_BASED_OUTPATIENT_CLINIC_OR_DEPARTMENT_OTHER): Payer: 59 | Attending: Internal Medicine

## 2018-05-07 DIAGNOSIS — L97319 Non-pressure chronic ulcer of right ankle with unspecified severity: Secondary | ICD-10-CM | POA: Insufficient documentation

## 2018-05-07 DIAGNOSIS — L97322 Non-pressure chronic ulcer of left ankle with fat layer exposed: Secondary | ICD-10-CM | POA: Diagnosis present

## 2018-05-07 DIAGNOSIS — L739 Follicular disorder, unspecified: Secondary | ICD-10-CM | POA: Diagnosis not present

## 2018-05-07 DIAGNOSIS — I83212 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 and inflammation: Secondary | ICD-10-CM | POA: Insufficient documentation

## 2018-05-07 DIAGNOSIS — E1022 Type 1 diabetes mellitus with diabetic chronic kidney disease: Secondary | ICD-10-CM | POA: Insufficient documentation

## 2018-05-07 DIAGNOSIS — N186 End stage renal disease: Secondary | ICD-10-CM | POA: Insufficient documentation

## 2018-05-07 DIAGNOSIS — J449 Chronic obstructive pulmonary disease, unspecified: Secondary | ICD-10-CM | POA: Diagnosis not present

## 2018-05-14 DIAGNOSIS — I83212 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 and inflammation: Secondary | ICD-10-CM | POA: Diagnosis not present

## 2018-05-21 DIAGNOSIS — I83212 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 and inflammation: Secondary | ICD-10-CM | POA: Diagnosis not present

## 2018-05-28 DIAGNOSIS — I83212 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 and inflammation: Secondary | ICD-10-CM | POA: Diagnosis not present

## 2018-06-03 ENCOUNTER — Encounter (HOSPITAL_BASED_OUTPATIENT_CLINIC_OR_DEPARTMENT_OTHER): Payer: 59 | Attending: Internal Medicine

## 2018-06-03 DIAGNOSIS — J449 Chronic obstructive pulmonary disease, unspecified: Secondary | ICD-10-CM | POA: Diagnosis not present

## 2018-06-03 DIAGNOSIS — I83212 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 and inflammation: Secondary | ICD-10-CM | POA: Diagnosis not present

## 2018-06-03 DIAGNOSIS — E1051 Type 1 diabetes mellitus with diabetic peripheral angiopathy without gangrene: Secondary | ICD-10-CM | POA: Insufficient documentation

## 2018-06-03 DIAGNOSIS — L97312 Non-pressure chronic ulcer of right ankle with fat layer exposed: Secondary | ICD-10-CM | POA: Diagnosis not present

## 2018-06-03 DIAGNOSIS — I509 Heart failure, unspecified: Secondary | ICD-10-CM | POA: Insufficient documentation

## 2018-06-03 DIAGNOSIS — L97322 Non-pressure chronic ulcer of left ankle with fat layer exposed: Secondary | ICD-10-CM | POA: Insufficient documentation

## 2018-06-03 DIAGNOSIS — N186 End stage renal disease: Secondary | ICD-10-CM | POA: Diagnosis not present

## 2018-06-03 DIAGNOSIS — E1022 Type 1 diabetes mellitus with diabetic chronic kidney disease: Secondary | ICD-10-CM | POA: Diagnosis not present

## 2018-06-11 DIAGNOSIS — I83212 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 and inflammation: Secondary | ICD-10-CM | POA: Diagnosis not present

## 2018-06-18 DIAGNOSIS — I83212 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 and inflammation: Secondary | ICD-10-CM | POA: Diagnosis not present

## 2018-06-25 DIAGNOSIS — I83212 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 and inflammation: Secondary | ICD-10-CM | POA: Diagnosis not present

## 2018-07-02 DIAGNOSIS — I83212 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 and inflammation: Secondary | ICD-10-CM | POA: Diagnosis not present

## 2018-07-09 ENCOUNTER — Encounter (HOSPITAL_BASED_OUTPATIENT_CLINIC_OR_DEPARTMENT_OTHER): Payer: 59 | Attending: Internal Medicine

## 2018-07-09 DIAGNOSIS — E1022 Type 1 diabetes mellitus with diabetic chronic kidney disease: Secondary | ICD-10-CM | POA: Diagnosis not present

## 2018-07-09 DIAGNOSIS — I509 Heart failure, unspecified: Secondary | ICD-10-CM | POA: Insufficient documentation

## 2018-07-09 DIAGNOSIS — N186 End stage renal disease: Secondary | ICD-10-CM | POA: Insufficient documentation

## 2018-07-09 DIAGNOSIS — I83213 Varicose veins of right lower extremity with both ulcer of ankle and inflammation: Secondary | ICD-10-CM | POA: Diagnosis present

## 2018-07-09 DIAGNOSIS — L97322 Non-pressure chronic ulcer of left ankle with fat layer exposed: Secondary | ICD-10-CM | POA: Insufficient documentation

## 2018-07-09 DIAGNOSIS — J449 Chronic obstructive pulmonary disease, unspecified: Secondary | ICD-10-CM | POA: Insufficient documentation

## 2018-07-09 DIAGNOSIS — L97312 Non-pressure chronic ulcer of right ankle with fat layer exposed: Secondary | ICD-10-CM | POA: Insufficient documentation

## 2018-07-16 DIAGNOSIS — I83213 Varicose veins of right lower extremity with both ulcer of ankle and inflammation: Secondary | ICD-10-CM | POA: Diagnosis not present

## 2018-07-23 DIAGNOSIS — I83213 Varicose veins of right lower extremity with both ulcer of ankle and inflammation: Secondary | ICD-10-CM | POA: Diagnosis not present

## 2018-07-30 DIAGNOSIS — I83213 Varicose veins of right lower extremity with both ulcer of ankle and inflammation: Secondary | ICD-10-CM | POA: Diagnosis not present

## 2018-08-06 ENCOUNTER — Encounter (HOSPITAL_BASED_OUTPATIENT_CLINIC_OR_DEPARTMENT_OTHER): Payer: 59 | Attending: Internal Medicine

## 2018-08-06 DIAGNOSIS — L97322 Non-pressure chronic ulcer of left ankle with fat layer exposed: Secondary | ICD-10-CM | POA: Insufficient documentation

## 2018-08-06 DIAGNOSIS — J449 Chronic obstructive pulmonary disease, unspecified: Secondary | ICD-10-CM | POA: Insufficient documentation

## 2018-08-06 DIAGNOSIS — N186 End stage renal disease: Secondary | ICD-10-CM | POA: Insufficient documentation

## 2018-08-06 DIAGNOSIS — I509 Heart failure, unspecified: Secondary | ICD-10-CM | POA: Diagnosis not present

## 2018-08-06 DIAGNOSIS — E1022 Type 1 diabetes mellitus with diabetic chronic kidney disease: Secondary | ICD-10-CM | POA: Insufficient documentation

## 2018-08-13 DIAGNOSIS — L97322 Non-pressure chronic ulcer of left ankle with fat layer exposed: Secondary | ICD-10-CM | POA: Diagnosis not present

## 2018-08-20 DIAGNOSIS — L97322 Non-pressure chronic ulcer of left ankle with fat layer exposed: Secondary | ICD-10-CM | POA: Diagnosis not present

## 2018-08-27 DIAGNOSIS — L97322 Non-pressure chronic ulcer of left ankle with fat layer exposed: Secondary | ICD-10-CM | POA: Diagnosis not present

## 2018-09-03 ENCOUNTER — Encounter (HOSPITAL_BASED_OUTPATIENT_CLINIC_OR_DEPARTMENT_OTHER): Payer: 59 | Attending: Internal Medicine

## 2018-09-03 ENCOUNTER — Other Ambulatory Visit: Payer: Self-pay

## 2018-09-03 DIAGNOSIS — E1022 Type 1 diabetes mellitus with diabetic chronic kidney disease: Secondary | ICD-10-CM | POA: Insufficient documentation

## 2018-09-03 DIAGNOSIS — N186 End stage renal disease: Secondary | ICD-10-CM | POA: Diagnosis not present

## 2018-09-03 DIAGNOSIS — J449 Chronic obstructive pulmonary disease, unspecified: Secondary | ICD-10-CM | POA: Diagnosis not present

## 2018-09-03 DIAGNOSIS — I509 Heart failure, unspecified: Secondary | ICD-10-CM | POA: Diagnosis not present

## 2018-09-03 DIAGNOSIS — L97322 Non-pressure chronic ulcer of left ankle with fat layer exposed: Secondary | ICD-10-CM | POA: Insufficient documentation

## 2018-09-10 DIAGNOSIS — L97322 Non-pressure chronic ulcer of left ankle with fat layer exposed: Secondary | ICD-10-CM | POA: Diagnosis not present

## 2018-09-17 DIAGNOSIS — L97322 Non-pressure chronic ulcer of left ankle with fat layer exposed: Secondary | ICD-10-CM | POA: Diagnosis not present

## 2018-09-24 DIAGNOSIS — L97322 Non-pressure chronic ulcer of left ankle with fat layer exposed: Secondary | ICD-10-CM | POA: Diagnosis not present

## 2018-10-01 ENCOUNTER — Encounter (HOSPITAL_BASED_OUTPATIENT_CLINIC_OR_DEPARTMENT_OTHER): Payer: 59 | Attending: Internal Medicine

## 2018-10-01 DIAGNOSIS — I509 Heart failure, unspecified: Secondary | ICD-10-CM | POA: Insufficient documentation

## 2018-10-01 DIAGNOSIS — L97322 Non-pressure chronic ulcer of left ankle with fat layer exposed: Secondary | ICD-10-CM | POA: Insufficient documentation

## 2018-10-01 DIAGNOSIS — E1022 Type 1 diabetes mellitus with diabetic chronic kidney disease: Secondary | ICD-10-CM | POA: Insufficient documentation

## 2018-10-01 DIAGNOSIS — N186 End stage renal disease: Secondary | ICD-10-CM | POA: Insufficient documentation

## 2018-10-01 DIAGNOSIS — E1051 Type 1 diabetes mellitus with diabetic peripheral angiopathy without gangrene: Secondary | ICD-10-CM | POA: Insufficient documentation

## 2018-10-01 DIAGNOSIS — J449 Chronic obstructive pulmonary disease, unspecified: Secondary | ICD-10-CM | POA: Insufficient documentation

## 2018-10-01 DIAGNOSIS — I83223 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 and inflammation: Secondary | ICD-10-CM | POA: Insufficient documentation

## 2018-10-08 DIAGNOSIS — I509 Heart failure, unspecified: Secondary | ICD-10-CM | POA: Diagnosis not present

## 2018-10-08 DIAGNOSIS — L97322 Non-pressure chronic ulcer of left ankle with fat layer exposed: Secondary | ICD-10-CM | POA: Diagnosis not present

## 2018-10-08 DIAGNOSIS — E1051 Type 1 diabetes mellitus with diabetic peripheral angiopathy without gangrene: Secondary | ICD-10-CM | POA: Diagnosis not present

## 2018-10-08 DIAGNOSIS — J449 Chronic obstructive pulmonary disease, unspecified: Secondary | ICD-10-CM | POA: Diagnosis not present

## 2018-10-08 DIAGNOSIS — N186 End stage renal disease: Secondary | ICD-10-CM | POA: Diagnosis not present

## 2018-10-08 DIAGNOSIS — E1022 Type 1 diabetes mellitus with diabetic chronic kidney disease: Secondary | ICD-10-CM | POA: Diagnosis not present

## 2018-10-08 DIAGNOSIS — I83223 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 and inflammation: Secondary | ICD-10-CM | POA: Diagnosis present

## 2018-10-15 DIAGNOSIS — I83223 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 and inflammation: Secondary | ICD-10-CM | POA: Diagnosis not present

## 2018-10-19 ENCOUNTER — Telehealth: Payer: Self-pay | Admitting: *Deleted

## 2018-10-19 ENCOUNTER — Encounter: Payer: Self-pay | Admitting: *Deleted

## 2018-10-19 NOTE — Telephone Encounter (Signed)
Called with patient and updated EMR. Advised her of procedures when she comes to  Office.  She  verbalized understanding, appreciation.

## 2018-10-21 ENCOUNTER — Other Ambulatory Visit: Payer: Self-pay

## 2018-10-21 ENCOUNTER — Ambulatory Visit: Payer: 59 | Admitting: Diagnostic Neuroimaging

## 2018-10-21 ENCOUNTER — Encounter: Payer: Self-pay | Admitting: Diagnostic Neuroimaging

## 2018-10-21 ENCOUNTER — Ambulatory Visit (INDEPENDENT_AMBULATORY_CARE_PROVIDER_SITE_OTHER): Payer: 59 | Admitting: Diagnostic Neuroimaging

## 2018-10-21 VITALS — BP 114/58 | HR 57 | Ht 68.0 in | Wt 141.0 lb

## 2018-10-21 DIAGNOSIS — R292 Abnormal reflex: Secondary | ICD-10-CM

## 2018-10-21 DIAGNOSIS — G629 Polyneuropathy, unspecified: Secondary | ICD-10-CM

## 2018-10-21 DIAGNOSIS — R202 Paresthesia of skin: Secondary | ICD-10-CM | POA: Diagnosis not present

## 2018-10-21 DIAGNOSIS — M21542 Acquired clubfoot, left foot: Secondary | ICD-10-CM | POA: Diagnosis not present

## 2018-10-21 DIAGNOSIS — Q667 Congenital pes cavus, unspecified foot: Secondary | ICD-10-CM

## 2018-10-21 MED ORDER — ALPRAZOLAM 0.5 MG PO TABS: ORAL_TABLET | ORAL | 0 refills | Status: DC

## 2018-10-21 NOTE — Progress Notes (Addendum)
[facility]  PATIENT: Razan Siler DOB: 1955-10-05  REFERRING CLINICIAN: Gwenlyn Saran HISTORY FROM: patient  REASON FOR VISIT: new consult    HISTORICAL  CHIEF COMPLAINT:  Chief Complaint  Patient presents with  . Progressive equinovarus contracture of left foot after a fal    rm 6 New Pt    HISTORY OF PRESENT ILLNESS:   63 year old female here for evaluation of lower extremity numbness, weakness, neuropathy evaluation.  Patient has had nonhealing ulcers of the left lower medial leg and ankle since 2010 or earlier.  Patient would have ulcers that would take 2 to 4 months, sometimes more than 6 months to heal.  She has had multiple treatments, wound care, surgery for these ulcers.  No evidence of peripheral arterial disease.  Ulcers were thought to be related to venous stasis.  Around 2017 patient started having problems with balance and walking.  Her left foot tended to turn inward, but also slightly on the right foot.  Her balance and walking has deteriorated.  She was using a cane and walker for ambulation.  She saw Dr. Doran Durand for evaluation and recommended AFO and heel cord lengthening surgeries, but this was held off due to her history of poorly healing ulcers.  Patient saw neurologist Dr. Posey Pronto in 2018, had EMG nerve conduction study which showed bilateral axonal peripheral neuropathy.  She was also noted to have hyperreflexia and extensor plantar responses, therefore was recommended to have MRI of thoracic and lumbar spine; and then possible brain and cervical spine. However patient cannot tolerate MRI scans due to claustrophobia.  She had lab testing for neuropathy etiologies which were normal.  No further follow-up with neurology at that time.    REVIEW OF SYSTEMS: Full 14 system review of systems performed and negative with exception of: as per HPI.    ALLERGIES: Allergies  Allergen Reactions  . Penicillins Hives and Itching    Has patient had a PCN  reaction causing immediate rash, facial/tongue/throat swelling, SOB or lightheadedness with hypotension: Yes Has patient had a PCN reaction causing severe rash involving mucus membranes or skin necrosis: No Has patient had a PCN reaction that required hospitalization: No Has patient had a PCN reaction occurring within the last 10 years: Yes If all of the above answers are "NO", then may proceed with Cephalosporin use.   . Adhesive [Tape] Itching  . Doxycycline Nausea And Vomiting  . Latex Itching    HOME MEDICATIONS: Outpatient Medications Prior to Visit  Medication Sig Dispense Refill  . acetaminophen (TYLENOL) 500 MG tablet Take 500-1,000 mg by mouth every 6 (six) hours as needed (FOR PAIN).     Marland Kitchen ibuprofen (ADVIL,MOTRIN) 200 MG tablet Take 400 mg by mouth every 6 (six) hours as needed (FOR PAIN).     . Multiple Vitamins-Calcium (ONE-A-DAY WOMENS PO) Take 1 tablet by mouth 3 (three) times a week.    . NON FORMULARY Take 1 tablet by mouth See admin instructions. CBD oil gummies: Chew 1 gummie every 5 hours as needed for pain    . oxyCODONE-acetaminophen (PERCOCET/ROXICET) 5-325 MG tablet Take 1-2 tablets by mouth every 6 (six) hours as needed for severe pain. 15 tablet 0  . silver nitrate applicators 50-93 % applicator Apply 1-2 Sticks topically every Friday.    . sulfamethoxazole-trimethoprim (BACTRIM DS) 800-160 MG tablet     . traMADol (ULTRAM) 50 MG tablet 50 mg as needed.    . vitamin C (ASCORBIC ACID) 500 MG tablet Take 500 mg  by mouth daily as needed (for seasonal colds).     . Wound Dressings (HYDROFERA BLUE FOAM DRESSING) PADS Apply topically every Friday.    Marland Kitchen oxyCODONE (OXY IR/ROXICODONE) 5 MG immediate release tablet Take 1 tablet (5 mg total) by mouth every 6 (six) hours as needed for severe pain. 120 tablet 0   No facility-administered medications prior to visit.     PAST MEDICAL HISTORY: Past Medical History:  Diagnosis Date  . Allergy   . Ankle wound 02/2016    bilateral  . Dental bridge present    lower  . Dental crowns present    upper right  . Open wounds involving multiple regions of lower extremity   . Peripheral vascular disease (HCC)    venous stasis ulcers bilateral lower extremities  . PONV (postoperative nausea and vomiting)     PAST SURGICAL HISTORY: Past Surgical History:  Procedure Laterality Date  . APPLICATION OF WOUND VAC Bilateral 12/12/2015   Procedure: APPLICATION OF WOUND VAC;  Surgeon: [name], DO;  Location: [facility];  Service: Plastics;  Laterality: Bilateral;  . DRESSING CHANGE UNDER ANESTHESIA Right 02/14/2016   Procedure: DRESSING CHANGE UNDER ANESTHESIA;  Surgeon: [name], DO;  Location: [facility];  Service: Plastics;  Laterality: Right;  . [place]  . I&D EXTREMITY Bilateral 12/12/2015   Procedure: DEBRIDEMENT OF BILATERAL LEG WOUNDS;  Surgeon: [name], DO;  Location: [facility];  Service: Plastics;  Laterality: Bilateral;  . I&D EXTREMITY Bilateral 01/07/2016   Procedure: IRRIGATION AND DEBRIDEMENT OF BILATERAL LEGS;  Surgeon: [name], DO;  Location: [facility];  Service: Plastics;  Laterality: Bilateral;  . PERIPHERAL VASCULAR CATHETERIZATION N/A 10/15/2015   Procedure: Abdominal Aortogram w/Lower Extremity;  Surgeon: Angelia Mould, MD;  Location: [facility];  Service: Cardiovascular;  Laterality: N/A;  . skin graph    . TONSILLECTOMY     as a child  . VEIN LIGATION AND STRIPPING Left late 1990's  . WOUND DEBRIDEMENT Left 02/14/2016   Procedure: SKIN GRAFT SPLIT THICKNESS TO LEFT ANKLE;  Surgeon: [name], DO;  Location: [facility];  Service: Plastics;  Laterality: Left;    FAMILY HISTORY: Family History  Problem Relation Age of Onset  . Hypertension Mother   . Lung cancer Father   . Diabetes Maternal Grandmother   . Diabetes Paternal  Grandmother   . Healthy Brother     SOCIAL HISTORY: Social History   Socioeconomic History  . Marital status: Married    Spouse name: Not on file  . Number of children: 0  . Years of education: 76  . Highest education level: Not on file  Occupational History  . Occupation: retired Glass blower/designer  Social Needs  . Financial resource strain: Not on file  . Food insecurity:    Worry: Not on file    Inability: Not on file  . Transportation needs:    Medical: Not on file    Non-medical: Not on file  Tobacco Use  . Smoking status: Former Smoker    Packs/day: 0.25    Years: 26.00    Pack years: 6.50    Types: Cigarettes    Last attempt to quit: 04/25/2007    Years since quitting: 11.4  . Smokeless tobacco: Never Used  Substance and Sexual Activity  . Alcohol use: Yes    Alcohol/week: 0.0 standard drinks    Comment: occasionally  .  Drug use: No  . Sexual activity: Yes  Lifestyle  . Physical activity:    Days per week: Not on file    Minutes per session: Not on file  . Stress: Not on file  Relationships  . Social connections:    Talks on phone: Not on file    Gets together: Not on file    Attends religious service: Not on file    Active member of club or organization: Not on file    Attends meetings of clubs or organizations: Not on file    Relationship status: Not on file  . Intimate partner violence:    Fear of current or ex partner: Not on file    Emotionally abused: Not on file    Physically abused: Not on file    Forced sexual activity: Not on file  Other Topics Concern  . Not on file  Social History Narrative   Patient lives with husband in a one story home. Has no children.  Took early retirement due to medical condition.  Formerly worked as a Glass blower/designer.  Education: 12th.       PHYSICAL EXAM  GENERAL EXAM/CONSTITUTIONAL: Vitals:  Vitals:   10/21/18 1502  BP: (!) 114/58  Pulse: (!) 57  Weight: 141 lb (64 kg)  Height: 5\' 8"  (1.727 m)      Body mass index is 21.44 kg/m. Wt Readings from Last 3 Encounters:  10/21/18 141 lb (64 kg)  03/08/18 132 lb 15 oz (60.3 kg)  03/03/18 133 lb (60.3 kg)     Patient is in no distress; well developed, nourished and groomed; neck is supple  CARDIOVASCULAR:  Examination of carotid arteries is normal; no carotid bruits  Regular rate and rhythm, no murmurs  Examination of peripheral vascular system by observation and palpation is normal  EYES:  Ophthalmoscopic exam of optic discs and posterior segments is normal; no papilledema or hemorrhages  No exam data present  MUSCULOSKELETAL:  Gait, strength, tone, movements noted in Neurologic exam below  NEUROLOGIC: MENTAL STATUS:  No flowsheet data found.  awake, alert, oriented to person, place and time  recent and remote memory intact  normal attention and concentration  language fluent, comprehension intact, naming intact  fund of knowledge appropriate   CRANIAL NERVE:   2nd - no papilledema on fundoscopic exam  2nd, 3rd, 4th, 6th - pupils equal and reactive to light, visual fields full to confrontation, extraocular muscles intact, no nystagmus  5th - facial sensation symmetric  7th - facial strength symmetric  8th - hearing intact  9th - palate elevates symmetrically, uvula midline  11th - shoulder shrug symmetric  12th - tongue protrusion midline  MOTOR:   MILD ATROPHY OF BILATERAL GASTROCNEMIUS AND TIBIALIS ANTERIOR  BILATERAL LOWER LEG WRAPPING / [place] LEFT > RIGHT; EQUINOVARUS DEFORMITY  normal bulk and tone, full strength in the BUE, BLE EXCEPT LIMITED BY CONTRACTURES  HIGH ARCHES  BILATERAL BUNIONS  SENSORY:   normal and symmetric to light touch, temperature, vibration  COORDINATION:   finger-nose-finger, fine finger movements normal  REFLEXES:   deep tendon reflexes --> UPPER EXT 1; KNEES 2; ANKLES 1; TRIPLE FLEXION RESPONSE TO PLANTAR STIM   GAIT/STATION:   UNSTEADY GAIT; CANNOT STAND ON HEELS; USES CANE     DIAGNOSTIC DATA (LABS, IMAGING, TESTING) - I reviewed patient records, labs, notes, testing and imaging myself where available.  Lab Results  Component Value Date   HGB 13.3 10/15/2015   HCT  39.0 10/15/2015      Component Value Date/Time   NA 143 04/07/2017 1226   K 4.4 04/07/2017 1226   CL 104 04/07/2017 1226   GLUCOSE 89 04/07/2017 1226   GLUCOSE 86 10/15/2015 0619   BUN 7 04/07/2017 1226   CREATININE 0.67 04/07/2017 1226   CALCIUM 9.0 04/07/2017 1226   PROT 6.7 04/07/2017 1226   ALBUMIN 4.3 04/07/2017 1226   AST 22 04/07/2017 1226   ALKPHOS 46 04/07/2017 1226   BILITOT 0.4 04/07/2017 1226   GFRNONAA 97 04/07/2017 1226   GFRAA 111 04/07/2017 1226   No results found for: CHOL, HDL, LDLCALC, LDLDIRECT, TRIG, CHOLHDL No results found for: HGBA1C Lab Results  Component Value Date   VITAMINB12 511 04/07/2017   Lab Results  Component Value Date   TSH 1.55 11/11/2016    11/13/16 EMG / NCS (Dr. Posey Pronto) - The electrophysiologic findings are consistent with a chronic sensorimotor polyneuropathy, axon loss in type, affecting the lower extremities. Overall, these findings are mild in degree electrically.There is no evidence of a sensorimotor polyneuropathy involving the right upper extremity.   11/11/16 neuropathy labs -B12, B1, TSH, copper, SPEP --> all normal   ASSESSMENT AND PLAN  63 y.o. 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  Acquired equinovarus deformities may result from central or peripheral nervous system etiologies.  Patient already has clinical and electrodiagnostic evidence of peripheral neuropathy.  Will check MRI to rule out other etiologies.  Dx:  1. Acquired equinovarus deformity of left foot   2. Neuropathy    3. High foot arch   4. Paresthesias   5. Hyperreflexia     PLAN:  ACQUIRED EQUINOVARUS DEFORMITY (LEFT > RIGHT) - may be related to idiopathic / hereditary neuropathy (i.e. CMT) based on prior EMG and negative neuropathy workup; no need to repeat EMG at this time time - check MRI thoracic and lumbar spine to rule out other etiologies; may also consider MRI brain and cervical spine in future to rule out other upper motor neuron etiologies  Orders Placed This Encounter  Procedures  . MR THORACIC SPINE W WO CONTRAST  . MR Lumbar Spine W Wo Contrast   Meds ordered this encounter  Medications  . ALPRAZolam (XANAX) 0.5 MG tablet    Sig: for sedation before MRI scan; take 1 tab 1 hour before scan; may repeat 1 tab 15 min before scan    Dispense:  3 tablet    Refill:  0   Return pending test results, for pending if symptoms worsen or fail to improve.    Penni Bombard, MD 08/15/1759, 6:07 PM Certified in Neurology, Neurophysiology and Neuroimaging  [facility] [address], [place], [place] [address] [phone]

## 2018-10-21 NOTE — Addendum Note (Signed)
Addended by: Andrey Spearman R on: 10/21/2018 04:38 PM   Modules accepted: Orders

## 2018-10-22 DIAGNOSIS — I83223 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 and inflammation: Secondary | ICD-10-CM | POA: Diagnosis not present

## 2018-10-28 ENCOUNTER — Telehealth: Payer: Self-pay | Admitting: Diagnostic Neuroimaging

## 2018-10-28 NOTE — Telephone Encounter (Signed)
[employer] Auth: [place] via [place] website patient would like open mri i faxed the order to [facility] they will reach out to the patient to schedule.  Patient also informed me she would like something to help her with her claustrophobic.

## 2018-10-29 ENCOUNTER — Other Ambulatory Visit (HOSPITAL_COMMUNITY)
Admission: RE | Admit: 2018-10-29 | Discharge: 2018-10-29 | Disposition: A | Discharge status: Home / Self Care | Payer: 59 | Source: Other Acute Inpatient Hospital | Attending: Internal Medicine | Admitting: Internal Medicine

## 2018-10-29 DIAGNOSIS — I83223 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 and inflammation: Secondary | ICD-10-CM | POA: Diagnosis not present

## 2018-10-29 DIAGNOSIS — L97322 Non-pressure chronic ulcer of left ankle with fat layer exposed: Secondary | ICD-10-CM | POA: Diagnosis present

## 2018-11-01 LAB — AEROBIC CULTURE W GRAM STAIN (SUPERFICIAL SPECIMEN): Gram Stain: NONE SEEN

## 2018-11-01 LAB — AEROBIC CULTURE? (SUPERFICIAL SPECIMEN)

## 2018-11-02 NOTE — Telephone Encounter (Signed)
Attempted to call patient on only # listed, voice MB not set up. On 10/21/18 Dr Leta Baptist ordered medication for her to take before MRI. If she calls back, phone staff may inform her it was sent to [facility].

## 2018-11-02 NOTE — Telephone Encounter (Signed)
Sent in on 10/21/18. -VRP

## 2018-11-04 NOTE — Telephone Encounter (Signed)
Patient is scheduled at [facility] for 11/18/18.

## 2018-11-05 ENCOUNTER — Other Ambulatory Visit: Payer: Self-pay

## 2018-11-05 ENCOUNTER — Encounter (HOSPITAL_BASED_OUTPATIENT_CLINIC_OR_DEPARTMENT_OTHER): Payer: 59 | Attending: Internal Medicine

## 2018-11-05 DIAGNOSIS — I509 Heart failure, unspecified: Secondary | ICD-10-CM | POA: Insufficient documentation

## 2018-11-05 DIAGNOSIS — L97322 Non-pressure chronic ulcer of left ankle with fat layer exposed: Secondary | ICD-10-CM | POA: Insufficient documentation

## 2018-11-05 DIAGNOSIS — L97822 Non-pressure chronic ulcer of other part of left lower leg with fat layer exposed: Secondary | ICD-10-CM | POA: Diagnosis not present

## 2018-11-05 DIAGNOSIS — N186 End stage renal disease: Secondary | ICD-10-CM | POA: Insufficient documentation

## 2018-11-05 DIAGNOSIS — I8311 Varicose veins of right lower extremity with inflammation: Secondary | ICD-10-CM | POA: Insufficient documentation

## 2018-11-05 DIAGNOSIS — I739 Peripheral vascular disease, unspecified: Secondary | ICD-10-CM | POA: Diagnosis not present

## 2018-11-05 DIAGNOSIS — E1022 Type 1 diabetes mellitus with diabetic chronic kidney disease: Secondary | ICD-10-CM | POA: Insufficient documentation

## 2018-11-11 ENCOUNTER — Encounter: Payer: 59 | Admitting: Diagnostic Neuroimaging

## 2018-11-12 DIAGNOSIS — L97322 Non-pressure chronic ulcer of left ankle with fat layer exposed: Secondary | ICD-10-CM | POA: Diagnosis not present

## 2018-11-19 ENCOUNTER — Other Ambulatory Visit (HOSPITAL_COMMUNITY)
Admission: RE | Admit: 2018-11-19 | Discharge: 2018-11-19 | Disposition: A | Discharge status: Home / Self Care | Payer: 59 | Source: Other Acute Inpatient Hospital | Attending: Internal Medicine | Admitting: Internal Medicine

## 2018-11-19 DIAGNOSIS — L97322 Non-pressure chronic ulcer of left ankle with fat layer exposed: Secondary | ICD-10-CM | POA: Insufficient documentation

## 2018-11-22 ENCOUNTER — Telehealth: Payer: Self-pay | Admitting: *Deleted

## 2018-11-22 DIAGNOSIS — Q667 Congenital pes cavus, unspecified foot: Secondary | ICD-10-CM

## 2018-11-22 DIAGNOSIS — M21542 Acquired clubfoot, left foot: Secondary | ICD-10-CM

## 2018-11-22 DIAGNOSIS — R202 Paresthesia of skin: Secondary | ICD-10-CM

## 2018-11-22 DIAGNOSIS — R292 Abnormal reflex: Secondary | ICD-10-CM

## 2018-11-22 LAB — AEROBIC CULTURE? (SUPERFICIAL SPECIMEN): Gram Stain: NONE SEEN

## 2018-11-22 LAB — AEROBIC CULTURE W GRAM STAIN (SUPERFICIAL SPECIMEN)

## 2018-11-22 NOTE — Telephone Encounter (Signed)
Received report of MRI lumbar spine form [facility]. On Dr [employer] desk for review.

## 2018-11-23 NOTE — Telephone Encounter (Signed)
Spoke with Linus Orn, [facility] re: MRI thoracic spine. She stated the patient was unable to tolerate even though she had taken Xanax. Called patient to give her MRI lumbar spine results. No answer, voice MB not set up.

## 2018-11-24 NOTE — Telephone Encounter (Signed)
Attempted to reach patient, no answer, voice MB not set up.

## 2018-11-24 NOTE — Telephone Encounter (Addendum)
Called patient and informed her that Dr Leta Baptist reviewed her MRI lumbar spine resuts; He stated the results are okay, some degenerative disk disease but no significant stenosis or narrowing. He recommends conservative management. She asked what he advised can be done about her ankle. She saw Dr Lucia Gaskins, [facility] who stated the issue is a tendon in her ankle,. He can do intense, serious surgery but that's not option for the patient at this time. Dr Lucia Gaskins felt a brace is not an option. He told her that he also felt it was a neurological issue, and that Botox injections may help. The patient stated she must have something done or she will eventually not be able to walk. She already walks "on the side of her foot". I advised will let Dr Leta Baptist know of her concerns, questions and call her back. Patient verbalized understanding, appreciation.

## 2018-11-24 NOTE — Telephone Encounter (Signed)
Pt returning call please call back °

## 2018-11-24 NOTE — Telephone Encounter (Signed)
-   check MRI thoracic spine to rule out other etiologies; may also consider MRI brain and cervical spine in future to rule out other upper motor neuron etiologies - consider MRI under general anesthesia if needed  -VRP

## 2018-11-26 DIAGNOSIS — L97322 Non-pressure chronic ulcer of left ankle with fat layer exposed: Secondary | ICD-10-CM | POA: Diagnosis not present

## 2018-11-29 NOTE — Telephone Encounter (Signed)
Called patient and advised her that DR [facility] recommends she proceed with MRI thoracic spine, and have it under general anesthesia if she feels she cannot tolerate it otherwise. I advised her he may consider MRI brain and cervical spine in the future. She asked why Botox couldn't be considered. She stated she doesn't want to go through MRI plus anesthesia. She asked questions regarding Botox which I told her are out of my scope of practice. She stated she must have something done about her ankle before she has to go to a nursing home to live because she's in a wheelchair. She would like Dr Leta Baptist to specifically address her questions about Botox. I advised I will send her questions to Dr Leta Baptist. She then stated she received a bill for our office, $17 and would like to discuss this with billing. I messaged billing, but they were not available. I advised will send them a message to call her. Patient verbalized understanding, appreciation.

## 2018-11-30 NOTE — Addendum Note (Signed)
Addended by: Andrey Spearman R on: 11/30/2018 12:27 PM   Modules accepted: Orders

## 2018-11-30 NOTE — Telephone Encounter (Signed)
Attempted to reach patient with further recommendations per Dr Leta Baptist. No answer, voice MB not set up.

## 2018-11-30 NOTE — Telephone Encounter (Addendum)
Patient called back to discuss. I advised her that Dr Leta Baptist will refer her to Pain Management and rehab clinic to evaluate and consider treatments (botox or otherwise).  I advised her that with the testing Dr Leta Baptist did, he does not know what is the cause of her ankle problem. Therefore he cannot recommend botox from a neurology standpoint. Pain management/Rehab and orthopaedics may consider other options. She stated that now Dr Leta Baptist wasn't recommending MRI. I reviewed our conversation from yesterday with her and reminded her that she specifically wanted Dr Leta Baptist to address botox.  I advised her that his first recommendation of MRI thoracic spine still stands.  She stated she wants referral to pain management, verbalized understanding, appreciation. Referral placed.

## 2018-11-30 NOTE — Addendum Note (Signed)
Addended by: Minna Antis on: 11/30/2018 10:39 AM   Modules accepted: Orders

## 2018-12-01 ENCOUNTER — Encounter (HOSPITAL_BASED_OUTPATIENT_CLINIC_OR_DEPARTMENT_OTHER): Payer: 59 | Attending: Physician Assistant

## 2018-12-01 DIAGNOSIS — E1051 Type 1 diabetes mellitus with diabetic peripheral angiopathy without gangrene: Secondary | ICD-10-CM | POA: Diagnosis not present

## 2018-12-01 DIAGNOSIS — J449 Chronic obstructive pulmonary disease, unspecified: Secondary | ICD-10-CM | POA: Insufficient documentation

## 2018-12-01 DIAGNOSIS — L97322 Non-pressure chronic ulcer of left ankle with fat layer exposed: Secondary | ICD-10-CM | POA: Diagnosis present

## 2018-12-01 DIAGNOSIS — E1022 Type 1 diabetes mellitus with diabetic chronic kidney disease: Secondary | ICD-10-CM | POA: Insufficient documentation

## 2018-12-01 DIAGNOSIS — F17210 Nicotine dependence, cigarettes, uncomplicated: Secondary | ICD-10-CM | POA: Diagnosis not present

## 2018-12-01 DIAGNOSIS — N186 End stage renal disease: Secondary | ICD-10-CM | POA: Insufficient documentation

## 2018-12-01 DIAGNOSIS — I509 Heart failure, unspecified: Secondary | ICD-10-CM | POA: Insufficient documentation

## 2018-12-02 ENCOUNTER — Encounter (HOSPITAL_BASED_OUTPATIENT_CLINIC_OR_DEPARTMENT_OTHER): Payer: 59

## 2018-12-10 DIAGNOSIS — L97322 Non-pressure chronic ulcer of left ankle with fat layer exposed: Secondary | ICD-10-CM | POA: Diagnosis not present

## 2018-12-17 DIAGNOSIS — L97322 Non-pressure chronic ulcer of left ankle with fat layer exposed: Secondary | ICD-10-CM | POA: Diagnosis not present

## 2018-12-23 ENCOUNTER — Encounter: Payer: Self-pay | Admitting: Physical Medicine and Rehabilitation

## 2018-12-24 DIAGNOSIS — L97322 Non-pressure chronic ulcer of left ankle with fat layer exposed: Secondary | ICD-10-CM | POA: Diagnosis not present

## 2018-12-31 DIAGNOSIS — L97322 Non-pressure chronic ulcer of left ankle with fat layer exposed: Secondary | ICD-10-CM | POA: Diagnosis not present

## 2019-01-07 ENCOUNTER — Other Ambulatory Visit: Payer: Self-pay

## 2019-01-07 ENCOUNTER — Encounter (HOSPITAL_BASED_OUTPATIENT_CLINIC_OR_DEPARTMENT_OTHER): Payer: 59 | Attending: Internal Medicine

## 2019-01-07 DIAGNOSIS — I509 Heart failure, unspecified: Secondary | ICD-10-CM | POA: Diagnosis not present

## 2019-01-07 DIAGNOSIS — E1051 Type 1 diabetes mellitus with diabetic peripheral angiopathy without gangrene: Secondary | ICD-10-CM | POA: Insufficient documentation

## 2019-01-07 DIAGNOSIS — L97322 Non-pressure chronic ulcer of left ankle with fat layer exposed: Secondary | ICD-10-CM | POA: Insufficient documentation

## 2019-01-07 DIAGNOSIS — J449 Chronic obstructive pulmonary disease, unspecified: Secondary | ICD-10-CM | POA: Diagnosis not present

## 2019-01-07 DIAGNOSIS — Z87891 Personal history of nicotine dependence: Secondary | ICD-10-CM | POA: Insufficient documentation

## 2019-01-07 DIAGNOSIS — N186 End stage renal disease: Secondary | ICD-10-CM | POA: Insufficient documentation

## 2019-01-07 DIAGNOSIS — E1022 Type 1 diabetes mellitus with diabetic chronic kidney disease: Secondary | ICD-10-CM | POA: Diagnosis not present

## 2019-01-11 ENCOUNTER — Encounter: Payer: 59 | Attending: Physical Medicine and Rehabilitation | Admitting: Physical Medicine and Rehabilitation

## 2019-01-11 ENCOUNTER — Other Ambulatory Visit: Payer: Self-pay

## 2019-01-11 ENCOUNTER — Encounter: Payer: Self-pay | Admitting: Physical Medicine and Rehabilitation

## 2019-01-11 VITALS — BP 102/51 | HR 55 | Temp 98.3°F | Ht 68.0 in | Wt 134.0 lb

## 2019-01-11 DIAGNOSIS — M21542 Acquired clubfoot, left foot: Secondary | ICD-10-CM | POA: Insufficient documentation

## 2019-01-11 DIAGNOSIS — R292 Abnormal reflex: Secondary | ICD-10-CM | POA: Diagnosis not present

## 2019-01-11 DIAGNOSIS — R269 Unspecified abnormalities of gait and mobility: Secondary | ICD-10-CM | POA: Insufficient documentation

## 2019-01-11 NOTE — Patient Instructions (Signed)
1. Hyperreflexia- open MRI of cervical spine to assess why having hyperreflexia, esp in setting of developing equinovarus deformity/contracture of L ankle.   2. Scars- of LLE and RLE vit E oil daily.  3. Equinovarus deformity of L ankle- would benefit from Botox- with splinting afterwards- however has L medial malleolus smaller than quarter wound- not fixed yet  - explained that Botox might not work because developing true contracture of L ankle/foot.   4. Vascular pain- got tramadol as needed- mainly after Friday debridement. Takes tylenol qam.  5. F/U in 1 month after MRI to detemine plan for hyperreflexia

## 2019-01-11 NOTE — Progress Notes (Deleted)
Subjective:    Patient ID: [name], female    DOB: 1955-12-04, 63 y.o.   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  Pain in ulcers- goes to wound care qfriday- needs debridement intermittently- painful- meds placed also burns-  Walking pain hurts because pushing pressure on L ankle. Per pt, less than quarter size.   Supposedly has venous ulcers due to slowed vein/blood flow. Was told there was no surgery to help that, but was told that 10 years.  Keeps taking longer and longer to heal- this last time for 3 years. RLE has been closed for 6 months.   Had fall in 10/19- fell backwards- had "cracked tailbone and lower spine". Probably due to losing balance. L ankle is "locked up" and rolls medially- gotten worse over last 3 months.  Didn't get anywhere with PT- 3x/week and doing HEP, but no progress- would help a smidgen- ankle locked up again every morning.  Making a callus on lateral aspect of L foot/ball of foot.    Struggling to walk- even with flat shoes.  Walking with 4 pronged cane.  Sent to Orthopedics and Neurology-   Pain Inventory Average Pain 6 Pain Right Now 6 My pain is sharp, burning, stabbing and aching  In the last 24 hours, has pain interfered with the following? General activity 6 Relation with others 6 Enjoyment of life 10 What TIME of day is your pain at its worst? daytime Sleep (in general) Poor  Pain is worse with: walking, standing and some activites Pain improves with: rest and medication Relief from Meds: 5  Mobility use a cane ability to climb steps?  yes do you drive?  yes 4 pronged cane  Function disabled: date disabled . retired  I need assistance with the following:  household duties and shopping Putting on shoes as well, and dressing LEs   Neuro/Psych trouble walking depression anxiety  Prior Studies Any changes since last visit?  no  Physicians involved in your care Any changes since last visit?  no  Just patient and husband who had massive CVA years ago and cannot do anything to care for her. No kids or anyone else to help her and husband.  Family History  Problem Relation Age of Onset  . Hypertension Mother   . Lung cancer Father   . Diabetes Maternal Grandmother   . Diabetes Paternal Grandmother   . Healthy Brother    Social History   Socioeconomic History  . Marital status: Married    Spouse name: Not on file  . Number of children: 0  . Years of education: 66  . Highest education level: Not on file  Occupational History  . Occupation: retired Glass blower/designer  Social Needs  . Financial resource strain: Not on file  . Food insecurity    Worry: Not on file    Inability: Not on file  . Transportation needs    Medical: Not on file    Non-medical: Not on file  Tobacco Use  . Smoking status: Former Smoker    Packs/day: 0.25    Years: 26.00    Pack years: 6.50    Types: Cigarettes  Quit date: 04/25/2007    Years since quitting: 11.7  . Smokeless tobacco: Never Used  Substance and Sexual Activity  . Alcohol use: Yes    Alcohol/week: 0.0 standard drinks    Comment: occasionally  . Drug use: No  . Sexual activity: Yes  Lifestyle  . Physical activity    Days per week: Not on file    Minutes per session: Not on file  . Stress: Not on file  Relationships  . Social Herbalist on phone: Not on file    Gets together: Not on file    Attends religious service: Not on file    Active member of club or organization: Not on file    Attends meetings of clubs or organizations: Not on file    Relationship status: Not on file  Other Topics Concern  . Not on file  Social  History Narrative   Patient lives with husband in a one story home. Has no children.  Took early retirement due to medical condition.  Formerly worked as a Glass blower/designer.  Education: 12th.     Past Surgical History:  Procedure Laterality Date  . APPLICATION OF WOUND VAC Bilateral 12/12/2015   Procedure: APPLICATION OF WOUND VAC;  Surgeon: [name], DO;  Location: [place];  Service: Plastics;  Laterality: Bilateral;  . DRESSING CHANGE UNDER ANESTHESIA Right 02/14/2016   Procedure: DRESSING CHANGE UNDER ANESTHESIA;  Surgeon: [name], DO;  Location: [place];  Service: Plastics;  Laterality: Right;  . [place]  . I&D EXTREMITY Bilateral 12/12/2015   Procedure: DEBRIDEMENT OF BILATERAL LEG WOUNDS;  Surgeon: [name], DO;  Location: [place];  Service: Plastics;  Laterality: Bilateral;  . I&D EXTREMITY Bilateral 01/07/2016   Procedure: IRRIGATION AND DEBRIDEMENT OF BILATERAL LEGS;  Surgeon: [name], DO;  Location: [place];  Service: Plastics;  Laterality: Bilateral;  . PERIPHERAL VASCULAR CATHETERIZATION N/A 10/15/2015   Procedure: Abdominal Aortogram w/Lower Extremity;  Surgeon: Angelia Mould, MD;  Location: [place] [facility];  Service: Cardiovascular;  Laterality: N/A;  . skin graph    . TONSILLECTOMY     as a child  . VEIN LIGATION AND STRIPPING Left late 1990's  . WOUND DEBRIDEMENT Left 02/14/2016   Procedure: SKIN GRAFT SPLIT THICKNESS TO LEFT ANKLE;  Surgeon: [name], DO;  Location: [place];  Service: Plastics;  Laterality: Left;   Past Medical History:  Diagnosis Date  . Allergy   . Ankle wound 02/2016   bilateral  . Dental bridge present    lower  . Dental crowns present    upper right  . Open wounds involving multiple regions of lower extremity   . Peripheral vascular disease (HCC)    venous stasis ulcers  bilateral lower extremities  . PONV (postoperative nausea and vomiting)    BP (!) 102/51   Pulse (!) 55   Temp 98.3 F (36.8 C)   Ht 5\' 8"  (1.727 m)   Wt 134 lb (60.8 kg)   SpO2 95%   BMI 20.37 kg/m   Opioid Risk Score:   Fall Risk Score:  `1  Depression screen PHQ 2/9  Depression screen [facility] 2/9 05/18/2017 04/27/2017 04/07/2017  Decreased Interest 0 0 1  Down, Depressed, Hopeless 0 0 1  PHQ - 2 Score 0 0 2  Altered sleeping - - 2  Tired, decreased energy - -  1  Change in appetite - - 1  Feeling bad or failure about yourself  - - 1  Trouble concentrating - - 1  Moving slowly or fidgety/restless - - 1  Suicidal thoughts - - 0  PHQ-9 Score - - 9  Difficult doing work/chores - - Not difficult at all     Review of Systems  Constitutional: Positive for unexpected weight change.  HENT: Negative.   Eyes: Negative.   Respiratory: Positive for shortness of breath.   Cardiovascular: Positive for leg swelling.  Gastrointestinal: Negative.   Endocrine: Negative.   Genitourinary: Negative.   Musculoskeletal: Positive for gait problem.  Skin: Negative.   Allergic/Immunologic: Negative.   Hematological: Negative.   Psychiatric/Behavioral: Positive for dysphoric mood. The patient is nervous/anxious.   All other systems reviewed and are negative. bowels work well for her- no constipation     Objective:   Physical Exam  Awake, alert, appropriate, sitting up on table, legs have unaboot on LLE and and compression stocking on RLE to knee, NAD Has 4 pronged cane- large base Borderline bradycardia, regular rhythm, no M/R/G CTA B/L- no w/r/r abd soft, NT, ND, (+)BS DTRs 3+ in knees and 4+ in elbows and brachioradialis B/L- and L tricep- no clonus, no hoffman's, no increased tone in UEs or LEs, but crossed in B/L biceps. MS: 5/5 in UES deltoids, biceps, WE, triceps, grip and finger abduction except 4+/5 in R finger abduction LEs- HF 5/5 B/L, KE 5/5 on R and 4+/5, on L, DF/PF and  EHL 5/5 on R- on L- DF 2/5 due to impaired ROM vs strength, PF 4/5, and EHL 5/5 Equinovarus contracture forming of L ankle/foot- has ~ 10 degrees ROM of L ankle/foot total. Neuro- no decreased sensation to LT- all intact in all dermatomes B/L CNs 2-10 intact on exam  Skin- scars pulling around R medial malleolus- skin closed- a few dark bruises on medial   -wearing unaboot on LLE- cannot assess visually Gait- when stands, L knee hyperextends dramatically to try and get toes/heel on grounds, but cannot get both on ground at same time.        Assessment & Plan:   1. Hyperreflexia- open MRI of cervical spine to assess why having hyperreflexia, esp in setting of developing equinovarus deformity/contracture of L ankle.   2. Scars- of LLE and RLE vit E oil daily.  3. Equinovarus deformity of L ankle- would benefit from Botox- with splinting afterwards- however has L medial malleolus smaller than quarter wound- not fixed yet  - explained that Botox might not work because developing true contracture of L ankle/foot.   4. Vascular pain- got tramadol as needed- mainly after Friday debridement. Takes tylenol qam.  5. F/U in 1 month after MRI to determine plan for hyperreflexia  6. Will call Vascular consult if have to to help heal wound on LLE.Marland Kitchen  Spent a total of 45 minutes on appt- 25 minutes on discussing options- pt very wary of procedures other than injections.

## 2019-01-14 DIAGNOSIS — L97322 Non-pressure chronic ulcer of left ankle with fat layer exposed: Secondary | ICD-10-CM | POA: Diagnosis not present

## 2019-01-17 ENCOUNTER — Other Ambulatory Visit: Payer: Self-pay

## 2019-01-17 ENCOUNTER — Encounter: Payer: 59 | Admitting: Physical Medicine & Rehabilitation

## 2019-01-17 ENCOUNTER — Encounter: Payer: Self-pay | Admitting: Physical Medicine & Rehabilitation

## 2019-01-17 VITALS — BP 118/52 | HR 53 | Temp 98.3°F | Ht 68.0 in | Wt 134.0 lb

## 2019-01-17 DIAGNOSIS — M21542 Acquired clubfoot, left foot: Secondary | ICD-10-CM | POA: Diagnosis not present

## 2019-01-17 DIAGNOSIS — R292 Abnormal reflex: Secondary | ICD-10-CM | POA: Diagnosis not present

## 2019-01-17 DIAGNOSIS — R269 Unspecified abnormalities of gait and mobility: Secondary | ICD-10-CM

## 2019-01-17 NOTE — Progress Notes (Signed)
Botox: Procedure Note Patient Name: [name] DOB: 12/03/1955 MRN: [id number]   Procedure: Botulinum toxin administration Guidance: EMG Diagnosis: Left equinovarus Date: 01/17/2019 Attending: Delice Lesch, MD    Trade name: Botox (onabotulinumtoxinA)  Informed consent: Risks, benefits & options of the procedure are explained to the patient (and/or family). The patient elects to proceed with procedure. Risks include but are not limited to weakness, respiratory distress, dry mouth, ptosis, antibody formation, worsening of some areas of function. Benefits include decreased abnormal muscle tone, improved hygiene and positioning, decreased skin breakdown and, in some cases, decreased pain. Options include conservative management with oral antispasticity agents, phenol chemodenervation of nerve or at motor nerve branches. More invasive options include intrathecal balcofen adminstration for appropriate candidates. Surgical options may include tendon lengthening or transposition or, rarely, dorsal rhizotomy.   History/Physical Examination: 63 y.o. 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  Given concern of CNS pathology, will trial injection, workup ongoing.  Left foot: Equinovarus contracture  Previous Treatments: Therapy/Range of motion Indication for guidance: Target active muscules  Procedure: Botulinum toxin was mixed with preservative free saline with a dilution of 1cc to 100 units. Targeted limb and muscles were identified. The skin was prepped with alcohol swabs and placement of needle tip in targeted muscle was confirmed using appropriate guidance. Prior to injection, positioning of needle tip outside of blood vessel was determined by pulling back on syringe plunger.  MUSCLE UNITS Left Med Gastroc:  50 Left Lat Gastroc:  50 Left Soleus:   50 Left FHL:   50  Total units used: [id number] Complications: None  Plan: RTC with Dr.  Alveta Heimlich for evaluation of efficacy  Tyniya Kuyper Lorie Phenix 9:56 AM

## 2019-01-19 NOTE — Progress Notes (Addendum)
Subjective:    Patient ID: [name], female    DOB: [birth year], 63 y.o.   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  Pain in ulcers- goes to wound care qfriday- needs debridement intermittently- painful- meds placed also burns-  Walking pain hurts because pushing pressure on L ankle. Per pt, less than quarter size.   Supposedly has venous ulcers due to slowed vein/blood flow. Was told there was no surgery to help that, but was told that 10 years.  Keeps taking longer and longer to heal- this last time for 3 years. RLE has been closed for 6 months.   Had fall in 10/19- fell backwards- had "cracked tailbone and lower spine". Probably due to losing balance. L ankle is "locked up" and rolls medially- gotten worse over last 3 months.  Didn't get anywhere with PT- 3x/week and doing HEP, but no progress- would help a smidgen- ankle locked up again every morning.  Making a callus on lateral aspect of L foot/ball of foot.    Struggling to walk- even with flat shoes.  Walking with 4 pronged cane.  Sent to Orthopedics and Neurology-  Pain Inventory Average Pain 6 Pain Right Now 6 My pain is sharp, burning, stabbing and aching  In the last 24 hours, has pain interfered with the following? General activity 6 Relation with others 10 Enjoyment of life 10 What TIME of day is your pain at its worst? daytime Sleep (in general) Poor  Pain is worse with: walking, standing and some activites Pain improves with: rest and medication Relief from Meds: 5  Mobility use a cane ability to climb steps?  yes do you drive?  yes 4 pronged cane  Function retired I need assistance with  the following:  household duties, shopping and putting on shoes and dresing LEs  Neuro/Psych trouble walking depression anxiety  Prior Studies new pt  Physicians involved in your care new pat   Family History  Problem Relation Age of Onset  . Hypertension Mother   . Lung cancer Father   . Diabetes Maternal Grandmother   . Diabetes Paternal Grandmother   . Healthy Brother    Social History   Socioeconomic History  . Marital status: Married    Spouse name: Not on file  . Number of children: 0  . Years of education: 47  . Highest education level: Not on file  Occupational History  . Occupation: retired Glass blower/designer  Social Needs  . Financial resource strain: Not on file  . Food insecurity    Worry: Not on file    Inability: Not on file  . Transportation needs    Medical: Not on file    Non-medical: Not on file  Tobacco Use  . Smoking status: Former Smoker    Packs/day: 0.25    Years: 26.00    Pack years: 6.50    Types: Cigarettes    Quit date: 04/25/2007    Years since quitting: 11.7  . Smokeless tobacco: Never Used  Substance and Sexual Activity  . Alcohol use: Yes    Alcohol/week: 0.0 standard drinks    Comment: occasionally  . Drug use: No  .  Sexual activity: Yes  Lifestyle  . Physical activity    Days per week: Not on file    Minutes per session: Not on file  . Stress: Not on file  Relationships  . Social Herbalist on phone: Not on file    Gets together: Not on file    Attends religious service: Not on file    Active member of club or organization: Not on file    Attends meetings of clubs or organizations: Not on file    Relationship status: Not on file  Other Topics Concern  . Not on file  Social History Narrative   Patient lives with husband in a one story home. Has no children.  Took early retirement due to medical condition.  Formerly worked as a Glass blower/designer.  Education: 12th.     Past Surgical History:  Procedure  Laterality Date  . APPLICATION OF WOUND VAC Bilateral 12/12/2015   Procedure: APPLICATION OF WOUND VAC;  Surgeon: [name], DO;  Location: [place];  Service: Plastics;  Laterality: Bilateral;  . DRESSING CHANGE UNDER ANESTHESIA Right 02/14/2016   Procedure: DRESSING CHANGE UNDER ANESTHESIA;  Surgeon: [name], DO;  Location: [place];  Service: Plastics;  Laterality: Right;  . [place]  . I&D EXTREMITY Bilateral 12/12/2015   Procedure: DEBRIDEMENT OF BILATERAL LEG WOUNDS;  Surgeon: [name], DO;  Location: [place];  Service: Plastics;  Laterality: Bilateral;  . I&D EXTREMITY Bilateral 01/07/2016   Procedure: IRRIGATION AND DEBRIDEMENT OF BILATERAL LEGS;  Surgeon: [name], DO;  Location: [place];  Service: Plastics;  Laterality: Bilateral;  . PERIPHERAL VASCULAR CATHETERIZATION N/A 10/15/2015   Procedure: Abdominal Aortogram w/Lower Extremity;  Surgeon: Angelia Mould, MD;  Location: [place] [facility];  Service: Cardiovascular;  Laterality: N/A;  . skin graph    . TONSILLECTOMY     as a child  . VEIN LIGATION AND STRIPPING Left late 1990's  . WOUND DEBRIDEMENT Left 02/14/2016   Procedure: SKIN GRAFT SPLIT THICKNESS TO LEFT ANKLE;  Surgeon: [name], DO;  Location: [place];  Service: Plastics;  Laterality: Left;   Past Medical History:  Diagnosis Date  . Allergy   . Ankle wound 02/2016   bilateral  . Dental bridge present    lower  . Dental crowns present    upper right  . Open wounds involving multiple regions of lower extremity   . Peripheral vascular disease (HCC)    venous stasis ulcers bilateral lower extremities  . PONV (postoperative nausea and vomiting)    BP (!) 102/51   Pulse (!) 55   Temp 98.3 F (36.8 C)   Ht 5\' 8"  (1.727 m)   Wt 134 lb (60.8 kg)   SpO2 95%   BMI 20.37 kg/m   Opioid Risk Score:    Fall Risk Score:  `1  Depression screen PHQ 2/9  Depression screen [facility] 2/9 05/18/2017 04/27/2017 04/07/2017  Decreased Interest 0 0 1  Down, Depressed, Hopeless 0 0 1  PHQ - 2 Score 0 0 2  Altered sleeping - - 2  Tired, decreased energy - - 1  Change in appetite - - 1  Feeling bad or failure about yourself  - - 1  Trouble concentrating - - 1  Moving slowly or fidgety/restless - - 1  Suicidal thoughts - - 0  PHQ-9 Score - - 9  Difficult doing work/chores - - Not difficult at all     Review of Systems  Constitutional: Negative.   HENT: Negative.   Eyes: Negative.   Respiratory: Negative.   Cardiovascular: Negative.   Gastrointestinal: Negative.   Endocrine: Negative.   Genitourinary: Negative.   Musculoskeletal: Positive for gait problem.  Skin: Negative.   Allergic/Immunologic: Negative.   Hematological: Negative.   Psychiatric/Behavioral: Positive for dysphoric mood. The patient is nervous/anxious.   All other systems reviewed and are negative.      Objective:  Physical Exam  Awake, alert, appropriate, sitting up on table, legs have unaboot on LLE and and compression stocking on RLE to knee, NAD Has 4 pronged cane- large base Borderline bradycardia, regular rhythm, no M/R/G CTA B/L- no w/r/r abd soft, NT, ND, (+)BS DTRs 3+ in knees and 4+ in elbows and brachioradialis B/L- and L tricep- no clonus, no hoffman's, no increased tone in UEs or LEs, but crossed in B/L biceps. MS: 5/5 in UES deltoids, biceps, WE, triceps, grip and finger abduction except 4+/5 in R finger abduction LEs- HF 5/5 B/L, KE 5/5 on R and 4+/5, on L, DF/PF and EHL 5/5 on R- on L- DF 2/5 due to impaired ROM vs strength, PF 4/5, and EHL 5/5 Equinovarus contracture forming of L ankle/foot- has ~ 10 degrees ROM of L ankle/foot total. Neuro- no decreased sensation to LT- all intact in all dermatomes B/L CNs 2-10 intact on exam  Skin- scars pulling around R medial malleolus- skin closed- a few dark  bruises on medial   -wearing unaboot on LLE- cannot assess visually Gait- when stands, L knee hyperextends dramatically to try and get toes/heel on grounds, but cannot get both on ground at same time.        Assessment & Plan:   1. Hyperreflexia- open MRI of cervical spine to assess why having hyperreflexia, esp in setting of developing equinovarus deformity/contracture of L ankle.   2. Scars- of LLE and RLE vit E oil daily.  3. Equinovarus deformity of L ankle- would benefit from Botox- with splinting afterwards- however has L medial malleolus smaller than quarter wound- not fixed yet  - explained that Botox might not work because developing true contracture of L ankle/foot.   4. Vascular pain- got tramadol as needed- mainly after Friday debridement. Takes tylenol qam.  5. F/U in 1 month after MRI to determine plan for hyperreflexia  6. Will call Vascular consult if have to to help heal wound on LLE.Marland Kitchen  Spent a total of 45 minutes on appt- 25 minutes on discussing options- pt very wary of procedures other than injections.

## 2019-01-21 DIAGNOSIS — L97322 Non-pressure chronic ulcer of left ankle with fat layer exposed: Secondary | ICD-10-CM | POA: Diagnosis not present

## 2019-01-28 DIAGNOSIS — L97322 Non-pressure chronic ulcer of left ankle with fat layer exposed: Secondary | ICD-10-CM | POA: Diagnosis not present

## 2019-02-04 ENCOUNTER — Encounter (HOSPITAL_BASED_OUTPATIENT_CLINIC_OR_DEPARTMENT_OTHER): Payer: Medicare Other | Attending: Internal Medicine

## 2019-02-04 DIAGNOSIS — E1022 Type 1 diabetes mellitus with diabetic chronic kidney disease: Secondary | ICD-10-CM | POA: Diagnosis not present

## 2019-02-04 DIAGNOSIS — E1051 Type 1 diabetes mellitus with diabetic peripheral angiopathy without gangrene: Secondary | ICD-10-CM | POA: Diagnosis not present

## 2019-02-04 DIAGNOSIS — I509 Heart failure, unspecified: Secondary | ICD-10-CM | POA: Diagnosis not present

## 2019-02-04 DIAGNOSIS — J449 Chronic obstructive pulmonary disease, unspecified: Secondary | ICD-10-CM | POA: Diagnosis not present

## 2019-02-04 DIAGNOSIS — L97322 Non-pressure chronic ulcer of left ankle with fat layer exposed: Secondary | ICD-10-CM | POA: Diagnosis present

## 2019-02-04 DIAGNOSIS — N186 End stage renal disease: Secondary | ICD-10-CM | POA: Insufficient documentation

## 2019-02-08 ENCOUNTER — Encounter: Payer: Medicare Other | Admitting: Physical Medicine and Rehabilitation

## 2019-02-18 DIAGNOSIS — L97322 Non-pressure chronic ulcer of left ankle with fat layer exposed: Secondary | ICD-10-CM | POA: Diagnosis not present

## 2019-02-21 ENCOUNTER — Ambulatory Visit (HOSPITAL_COMMUNITY)
Admission: RE | Admit: 2019-02-21 | Discharge: 2019-02-21 | Disposition: A | Discharge status: Home / Self Care | Payer: Medicare Other | Source: Ambulatory Visit | Attending: Physical Medicine and Rehabilitation | Admitting: Physical Medicine and Rehabilitation

## 2019-02-21 DIAGNOSIS — Z20828 Contact with and (suspected) exposure to other viral communicable diseases: Secondary | ICD-10-CM | POA: Diagnosis not present

## 2019-02-21 DIAGNOSIS — R292 Abnormal reflex: Secondary | ICD-10-CM | POA: Diagnosis not present

## 2019-02-21 DIAGNOSIS — Z01812 Encounter for preprocedural laboratory examination: Secondary | ICD-10-CM | POA: Insufficient documentation

## 2019-02-21 DIAGNOSIS — R2689 Other abnormalities of gait and mobility: Secondary | ICD-10-CM | POA: Diagnosis not present

## 2019-02-23 ENCOUNTER — Encounter (HOSPITAL_COMMUNITY): Payer: Self-pay | Admitting: Orthopedic Surgery

## 2019-02-23 LAB — NOVEL CORONAVIRUS, NAA (HOSP ORDER, SEND-OUT TO REF LAB; TAT 18-24 HRS): SARS-CoV-2, NAA: NOT DETECTED

## 2019-02-23 NOTE — Anesthesia Preprocedure Evaluation (Addendum)
Anesthesia Evaluation  Patient identified by MRN, date of birth, ID band Patient awake    Reviewed: Allergy & Precautions, NPO status , Patient's Chart, lab work & pertinent test results  History of Anesthesia Complications (+) PONV and history of anesthetic complications  Airway Mallampati: II  TM Distance: >3 FB Neck ROM: Full    Dental no notable dental hx. (+) Dental Advisory Given   Pulmonary former smoker,    Pulmonary exam normal        Cardiovascular + Peripheral Vascular Disease  Normal cardiovascular exam     Neuro/Psych Anxiety negative neurological ROS  negative psych ROS   GI/Hepatic negative GI ROS, Neg liver ROS,   Endo/Other  negative endocrine ROS  Renal/GU negative Renal ROS  negative genitourinary   Musculoskeletal negative musculoskeletal ROS (+)   Abdominal   Peds negative pediatric ROS (+)  Hematology negative hematology ROS (+)   Anesthesia Other Findings   Reproductive/Obstetrics negative OB ROS                            Lab Results  Component Value Date   HGB 13.3 10/15/2015   HCT 39.0 10/15/2015   Lab Results  Component Value Date   CREATININE 0.67 04/07/2017   BUN 7 04/07/2017   NA 143 04/07/2017   K 4.4 04/07/2017   CL 104 04/07/2017   No results found for: INR, PROTIME  10/2015 EKG: sinus bradycardia.  Anesthesia Physical  Anesthesia Plan  ASA: II  Anesthesia Plan: General   Post-op Pain Management:    Induction: Intravenous  PONV Risk Score and Plan: Ondansetron, Dexamethasone and Treatment may vary due to age or medical condition  Airway Management Planned: Oral ETT  Additional Equipment:   Intra-op Plan:   Post-operative Plan: Extubation in OR  Informed Consent: I have reviewed the patients History and Physical, chart, labs and discussed the procedure including the risks, benefits and alternatives for the proposed anesthesia  with the patient or authorized representative who has indicated his/her understanding and acceptance.     Dental advisory given  Plan Discussed with: Anesthesiologist  Anesthesia Plan Comments:        Anesthesia Quick Evaluation

## 2019-02-24 ENCOUNTER — Ambulatory Visit (HOSPITAL_COMMUNITY)
Admission: RE | Admit: 2019-02-24 | Discharge: 2019-02-24 | Disposition: A | Discharge status: Home / Self Care | Payer: Medicare Other | Attending: Physical Medicine and Rehabilitation | Admitting: Physical Medicine and Rehabilitation

## 2019-02-24 ENCOUNTER — Encounter (HOSPITAL_COMMUNITY): Payer: Self-pay | Admitting: Certified Registered Nurse Anesthetist

## 2019-02-24 ENCOUNTER — Ambulatory Visit (HOSPITAL_COMMUNITY): Payer: Medicare Other | Admitting: Anesthesiology

## 2019-02-24 ENCOUNTER — Ambulatory Visit (HOSPITAL_COMMUNITY)
Admission: RE | Admit: 2019-02-24 | Discharge: 2019-02-24 | Disposition: A | Discharge status: Home / Self Care | Payer: Medicare Other | Source: Ambulatory Visit | Attending: Physical Medicine and Rehabilitation | Admitting: Physical Medicine and Rehabilitation

## 2019-02-24 ENCOUNTER — Encounter (HOSPITAL_COMMUNITY)
Admission: RE | Disposition: A | Discharge status: Home / Self Care | Payer: Self-pay | Source: Home / Self Care | Attending: Physical Medicine and Rehabilitation

## 2019-02-24 ENCOUNTER — Other Ambulatory Visit: Payer: Self-pay

## 2019-02-24 DIAGNOSIS — M24572 Contracture, left ankle: Secondary | ICD-10-CM | POA: Insufficient documentation

## 2019-02-24 DIAGNOSIS — R292 Abnormal reflex: Secondary | ICD-10-CM

## 2019-02-24 DIAGNOSIS — M5031 Other cervical disc degeneration,  high cervical region: Secondary | ICD-10-CM | POA: Diagnosis not present

## 2019-02-24 DIAGNOSIS — R269 Unspecified abnormalities of gait and mobility: Secondary | ICD-10-CM | POA: Insufficient documentation

## 2019-02-24 HISTORY — PX: RADIOLOGY WITH ANESTHESIA: SHX6223

## 2019-02-24 LAB — POCT I-STAT, CHEM 8
BUN: 8 mg/dL (ref 8–23)
Calcium, Ion: 1.13 mmol/L — ABNORMAL LOW (ref 1.15–1.40)
Chloride: 107 mmol/L (ref 98–111)
Creatinine, Ser: 0.6 mg/dL (ref 0.44–1.00)
Glucose, Bld: 85 mg/dL (ref 70–99)
HCT: 37 % (ref 36.0–46.0)
Hemoglobin: 12.6 g/dL (ref 12.0–15.0)
Potassium: 3.8 mmol/L (ref 3.5–5.1)
Sodium: 141 mmol/L (ref 135–145)
TCO2: 25 mmol/L (ref 22–32)

## 2019-02-24 SURGERY — MRI WITH ANESTHESIA
Anesthesia: General

## 2019-02-24 MED ORDER — LACTATED RINGERS IV SOLN: INTRAVENOUS | Status: DC

## 2019-02-24 NOTE — Transfer of Care (Signed)
Immediate Anesthesia Transfer of Care Note  Patient: [name]  Procedure(s) Performed: MIR CERVICAL WITH AND WITHOUT CONTRAST (N/A )  Patient Location: PACU  Anesthesia Type:General  Level of Consciousness: awake, alert  and oriented  Airway & Oxygen Therapy: Patient Spontanous Breathing and Patient connected to nasal cannula oxygen  Post-op Assessment: Report given to RN, Post -op Vital signs reviewed and stable and Patient moving all extremities  Post vital signs: Reviewed and stable  Last Vitals:  Vitals Value Taken Time  BP 109/63 02/24/19 1127  Temp    Pulse 77 02/24/19 1130  Resp 17 02/24/19 1130  SpO2 100 % 02/24/19 1130  Vitals shown include unvalidated device data.  Last Pain:  Vitals:   02/24/19 0838  TempSrc:   PainSc: 5       Patients Stated Pain Goal: 2 ([id number])  Complications: No apparent anesthesia complications

## 2019-02-24 NOTE — Anesthesia Postprocedure Evaluation (Signed)
Anesthesia Post Note  Patient: [name]  Procedure(s) Performed: MIR CERVICAL WITH AND WITHOUT CONTRAST (N/A )     Patient location during evaluation: PACU Anesthesia Type: General Level of consciousness: awake and alert Pain management: pain level controlled Vital Signs Assessment: post-procedure vital signs reviewed and stable Respiratory status: spontaneous breathing, nonlabored ventilation, respiratory function stable and patient connected to nasal cannula oxygen Cardiovascular status: blood pressure returned to baseline and stable Postop Assessment: no apparent nausea or vomiting Anesthetic complications: no    Last Vitals:  Vitals:   02/24/19 1130 02/24/19 1141  BP: 109/63 124/63  Pulse: 77 77  Resp: 17 18  Temp: (!) 36.3 C (!) 36.1 C  SpO2: 100% 100%    Last Pain:  Vitals:   02/24/19 1141  TempSrc:   PainSc: 0-No pain                 Mekenna Finau S

## 2019-02-25 ENCOUNTER — Encounter (HOSPITAL_COMMUNITY): Payer: Self-pay | Admitting: Radiology

## 2019-03-02 ENCOUNTER — Other Ambulatory Visit: Payer: Self-pay

## 2019-03-02 ENCOUNTER — Encounter: Payer: Self-pay | Admitting: Physical Medicine and Rehabilitation

## 2019-03-02 ENCOUNTER — Encounter
Payer: Medicare Other | Attending: Physical Medicine and Rehabilitation | Admitting: Physical Medicine and Rehabilitation

## 2019-03-02 VITALS — BP 124/65 | HR 60 | Temp 97.9°F | Ht 68.0 in | Wt 134.0 lb

## 2019-03-02 DIAGNOSIS — M21542 Acquired clubfoot, left foot: Secondary | ICD-10-CM

## 2019-03-02 DIAGNOSIS — M79605 Pain in left leg: Secondary | ICD-10-CM | POA: Diagnosis not present

## 2019-03-02 DIAGNOSIS — R269 Unspecified abnormalities of gait and mobility: Secondary | ICD-10-CM | POA: Insufficient documentation

## 2019-03-02 DIAGNOSIS — R292 Abnormal reflex: Secondary | ICD-10-CM | POA: Diagnosis not present

## 2019-03-02 DIAGNOSIS — M79604 Pain in right leg: Secondary | ICD-10-CM

## 2019-03-02 DIAGNOSIS — G8929 Other chronic pain: Secondary | ICD-10-CM | POA: Diagnosis not present

## 2019-03-02 NOTE — Progress Notes (Signed)
Subjective:    Patient ID: [name], female    DOB: 11-14-1955, 63 y.o.   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   Still wrapped with Uniboot on LLE; was looking real good- not as inflamed looking. Down to less than nickel size, per pt.  Using RLE compression stocking.  Cleaning out closet- was pushing a little much by cleaning out closet. Still takes tramadol for debridement.  Takes tylenol in AM- gives some stiffness relief. Takes 2 tabs. Takes 1 in afternoon sometimes, but not always.  Had Botox by Dr Posey Pronto- was done 01/17/2019- has gotten some ROM in L foot- significantly more PF, however has only gotten a little DF- foot at rest is slightly rolled to side, however has a little more ability to get into neutral . Maybe 10% more into neutral.      Pain Inventory Average Pain 10 Pain Right Now 5 My pain is sharp and stabbing  In the last 24 hours, has pain interfered with the following? General activity 4 Relation with others 4 Enjoyment of life 9 What TIME of day is your pain at its worst? daytime Sleep (in general) Fair  Pain is worse with: walking and standing Pain improves with: rest and medication Relief from Meds: 5  Mobility walk without assistance use a cane ability to climb steps?  no do you drive?  yes  Function retired  Neuro/Psych numbness trouble walking depression  Prior Studies Any changes since last visit?  no  Physicians involved in your care Any changes since last visit?  no   Family History  Problem Relation Age of Onset  . Hypertension Mother   . Lung cancer Father   . Diabetes Maternal Grandmother   .  Diabetes Paternal Grandmother   . Healthy Brother    Social History   Socioeconomic History  . Marital status: Married    Spouse name: Not on file  . Number of children: 0  . Years of education: 64  . Highest education level: Not on file  Occupational History  . Occupation: retired Glass blower/designer  Social Needs  . Financial resource strain: Not on file  . Food insecurity    Worry: Not on file    Inability: Not on file  . Transportation needs    Medical: Not on file    Non-medical: Not on file  Tobacco Use  . Smoking status: Former Smoker    Packs/day: 0.25    Years: 26.00    Pack years: 6.50    Types: Cigarettes    Quit date: 04/25/2007    Years since quitting: 11.8  . Smokeless tobacco: Never Used  Substance and Sexual Activity  . Alcohol use: Yes    Alcohol/week: 0.0 standard drinks    Comment: occasionally  . Drug use: No  . Sexual activity: Yes  Lifestyle  . Physical activity    Days per week: Not on file    Minutes per session: Not on file  . Stress: Not on file  Relationships  . Social Herbalist on phone: Not on file    Gets together: Not on  file    Attends religious service: Not on file    Active member of club or organization: Not on file    Attends meetings of clubs or organizations: Not on file    Relationship status: Not on file  Other Topics Concern  . Not on file  Social History Narrative   Patient lives with husband in a one story home. Has no children.  Took early retirement due to medical condition.  Formerly worked as a Glass blower/designer.  Education: 12th.     Past Surgical History:  Procedure Laterality Date  . APPLICATION OF WOUND VAC Bilateral 12/12/2015   Procedure: APPLICATION OF WOUND VAC;  Surgeon: [name], DO;  Location: [place];  Service: Plastics;  Laterality: Bilateral;  . DRESSING CHANGE UNDER ANESTHESIA Right 02/14/2016   Procedure: DRESSING CHANGE UNDER ANESTHESIA;  Surgeon: [name], DO;  Location: [place];  Service: Plastics;  Laterality: Right;  . [place]  . I&D EXTREMITY Bilateral 12/12/2015   Procedure: DEBRIDEMENT OF BILATERAL LEG WOUNDS;  Surgeon: [name], DO;  Location: [place];  Service: Plastics;  Laterality: Bilateral;  . I&D EXTREMITY Bilateral 01/07/2016   Procedure: IRRIGATION AND DEBRIDEMENT OF BILATERAL LEGS;  Surgeon: [name], DO;  Location: [place];  Service: Plastics;  Laterality: Bilateral;  . PERIPHERAL VASCULAR CATHETERIZATION N/A 10/15/2015   Procedure: Abdominal Aortogram w/Lower Extremity;  Surgeon: Angelia Mould, MD;  Location: [place] [facility];  Service: Cardiovascular;  Laterality: N/A;  . RADIOLOGY WITH ANESTHESIA N/A 02/24/2019   Procedure: MIR CERVICAL WITH AND WITHOUT CONTRAST;  Surgeon: Radiologist, Medication, MD;  Location: [place];  Service: Radiology;  Laterality: N/A;  . skin graph    . TONSILLECTOMY     as a child  . VEIN LIGATION AND STRIPPING Left late 1990's  . WOUND DEBRIDEMENT Left 02/14/2016   Procedure: SKIN GRAFT SPLIT THICKNESS TO LEFT ANKLE;  Surgeon: [name], DO;  Location: [place];  Service: Plastics;  Laterality: Left;   Past Medical History:  Diagnosis Date  . Allergy   . Ankle wound 02/2016   bilateral  . Dental bridge present    lower  . Dental crowns present    upper right  . Open wounds involving multiple regions of lower extremity   . Peripheral vascular disease (HCC)    venous stasis ulcers bilateral lower extremities  . PONV (postoperative nausea and vomiting)    BP 124/65   Pulse 60   Temp 97.9 F (36.6 C)   Ht 5\' 8"  (1.727 m)   Wt 134 lb (60.8 kg)   SpO2 96%   BMI 20.37 kg/m   Opioid Risk Score:   Fall Risk Score:  `1  Depression screen PHQ 2/9  Depression screen [facility] 2/9 05/18/2017 04/27/2017 04/07/2017  Decreased Interest 0 0 1  Down, Depressed,  Hopeless 0 0 1  PHQ - 2 Score 0 0 2  Altered sleeping - - 2  Tired, decreased energy - - 1  Change in appetite - - 1  Feeling bad or failure about yourself  - - 1  Trouble concentrating - - 1  Moving slowly or fidgety/restless - - 1  Suicidal thoughts - - 0  PHQ-9 Score - - 9  Difficult doing work/chores - - Not difficult at all     Review of Systems  Constitutional: Positive for unexpected weight change.  HENT:  Negative.   Eyes: Negative.   Respiratory: Negative.   Cardiovascular: Negative.   Gastrointestinal: Negative.   Endocrine: Negative.   Genitourinary: Negative.   Musculoskeletal: Positive for arthralgias, gait problem and myalgias.  Skin: Negative.   Allergic/Immunologic: Negative.   Neurological: Positive for numbness.  Hematological: Negative.   Psychiatric/Behavioral: Negative.   All other systems reviewed and are negative.      Objective:   Physical Exam Awake, alert, appropriate, L foot more in neutral in shoe, NAD Using quad cane  LLE wrapped in uniboot- up to just below knee  has gotten some ROM in L foot- significantly more PF, however has only gotten a little DF- foot at rest is slightly rolled to side, however has a little more ability to get into neutral . Maybe 10% more into neutral.  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 Suggest ankle support in shoes--also  suggest a toe-up in shoe to prevent stumbling - Keens have it commonly, but not in EVERY shoe. http://[email or web address]  2. Haven't asked for any more refills- for tramadol- still have 10-12 pills-Can con't wound care physician writing for Tramadol, since taking so little, however if pt needs me to take over prescribing, I can in future.  3. Went over cervical MRI results- basically normal  4. Once wound heals, will refer to a Pediatric  Orthopedist, since they more commonly do contracture release.  5. Hasn't done hyperbaric O2 in past since very claustrophobic, cannot tolerate hyperbarics.  6. Have Dr Posey Pronto do Botox again in ~ 6 weeks.  7. F/U in 2 months  I spent a total of 25 minutes on appointment- more than 15 minutes going over plan as documented above.

## 2019-03-02 NOTE — Patient Instructions (Signed)
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 Suggest ankle support in shoes--also  suggest a toe-up in shoe to prevent stumbling - Keens have it commonly, but not in EVERY shoe. [email or web address]  2. Haven't asked for any more refills- for tramadol- still have 10-12 pills-Can con't wound care physician writing for Tramadol, since taking so little, however if pt needs me to take over prescribing, I can in future.  3. Went over cervical MRI results- basically normal  4. Once wound heals, will refer to a Pediatric Orthopedist, since they more commonly do contracture release.  5. Hasn't done hyperbaric O2 in past since very claustrophobic, cannot tolerate hyperbarics.  6. Have Dr Posey Pronto do Botox again in ~ 6 weeks.  7. F/U in 2 months

## 2019-03-04 ENCOUNTER — Encounter (HOSPITAL_BASED_OUTPATIENT_CLINIC_OR_DEPARTMENT_OTHER): Payer: Medicare Other | Attending: Internal Medicine | Admitting: Internal Medicine

## 2019-03-04 ENCOUNTER — Other Ambulatory Visit: Payer: Self-pay

## 2019-03-04 DIAGNOSIS — E1022 Type 1 diabetes mellitus with diabetic chronic kidney disease: Secondary | ICD-10-CM | POA: Diagnosis not present

## 2019-03-04 DIAGNOSIS — I509 Heart failure, unspecified: Secondary | ICD-10-CM | POA: Insufficient documentation

## 2019-03-04 DIAGNOSIS — N186 End stage renal disease: Secondary | ICD-10-CM | POA: Insufficient documentation

## 2019-03-04 DIAGNOSIS — L97322 Non-pressure chronic ulcer of left ankle with fat layer exposed: Secondary | ICD-10-CM | POA: Diagnosis present

## 2019-03-04 DIAGNOSIS — J449 Chronic obstructive pulmonary disease, unspecified: Secondary | ICD-10-CM | POA: Diagnosis not present

## 2019-03-04 DIAGNOSIS — E1051 Type 1 diabetes mellitus with diabetic peripheral angiopathy without gangrene: Secondary | ICD-10-CM | POA: Diagnosis not present

## 2019-03-04 NOTE — Progress Notes (Signed)
[name], [name] ([id number]) Visit Report for 03/04/2019 HPI Details Patient Name: Date of Service: [name], [name] 03/04/2019 10:15 AM Medical Record [id number] Patient Account Number: [id number] Date of Birth/Sex: Treating RN: Oct 10, 1955 (63 y.o. [name] Provider: [name] Other Clinician: [name] Referring Provider: Treating Provider/Extender:Shyquan Stallbaumer, Tawny Asal in Treatment: 325 History of Present Illness HPI Description: the remaining wound is over the left medial ankle. Similar wound over the right medial ankle healed largely with use of Apligraf. Most recently we have been using Hydrofera Blue over this wound with considerable improvement. The patient has been extensively worked up in the past for her venous insufficiency and she is not a candidate for antireflux surgery although I have none of the details available currently. 08/24/14; considerable improvement today. About 50% of this wound areas now epithelialized. The base of the wound appears to be healthier granulation.as opposed to last week when she had deteriorated a considerable improvement 08/17/14; unfortunately the wound has regressed somewhat. The areas of epithelialization from the superior aspect are not nearly as healthy as they were last week. The patient thinks her Hydrofera Blue slipped. 09/07/14; unfortunately the area has markedly regressed in the 2 weeks since I've seen this. There is an odor surrounding erythema. The healthy granulation tissue that we had at the base of the wound now is a dusky color. The nurse reports green drainage 09/14/14; the area looks somewhat better than last week. There is less erythema and less drainage. The culture I did did not show any growth. Nevertheless I think it is better to continue the Cipro and doxycycline for a further week. The remaining wound area was debridement. 09/21/14. Wound did not require debridement last  week. Still less erythema and less drainage. She can complete her antibiotics. The areas of epithelialization in the superior aspect of the wound do not look as healthy as they did some weeks ago 10/05/14 continued improvement in the condition of this wound. There is advancing epithelialization. Less aggressive debridement required 10/19/14 continued improvement in the condition and volume of this wound. Less aggressive debridement to the inferior part of this to remove surface slough and fibrinous eschar 11/02/14 no debridement is required. The surface granulation appears healthy although some of her islands of epithelialization seem to have regressed. No evidence of infection 11/16/14; lites surface debridement done of surface eschar. The wound does not look to be unhealthy. No evidence of infection. Unfortunately the patient has had podiatry issues in the right foot and for some reason has redeveloped small surface ulcerations in the medial right ankle. Her original presentation involved wounds in this area 11/23/14 no debridement. The area on the right ankle has enlarged. The left ankle wound appears stable in terms of the surface although there is periwound inflammation. There has been regression in the amount of new skin 11/30/14 no debridement. Both wound areas appear healthy. There was no evidence of infection. The the new area on the right medial ankle has enlarged although that both the surfaces appear to be stable. 12/07/14; Debridement of the right medial ankle wound. No no debridement was done on the left. 12/14/14 no major change in and now bilateral medial ankle wounds. Both of these are very painful but the no overt evidence of infection. She has had previous venous ablation 12/21/14; patient states that her right medial ankle wound is considerably more painful last week than usual. Her left is also somewhat painful. She could not tolerate debridement. The right  medial ankle wound has  fibrinous surface eschar 12/28/14 this is a patient with severe bilateral venous insufficiency ulcers. For a considerable period of time we actually had the one on the right medial ankle healed however this recently opened up again in June. The left medial ankle wound has been a refractory area with some absent flows. We had some success with Hydrofera Blue on this area and it literally closed by 50% however it is recently opened up Foley. Both of these were debridement today of surface eschar. She tolerates this poorly 01/25/15: No change in the status of this. Thick adherent escar. Very poor tolerance of any attempt at debridement. I had healed the right medial malleolus wound for a considerable amount of time and had the left one down to about 50% of the volume although this is totally regressed over the last 48 weeks. Further the right leg has reopened. she is trying to make a appointment with pain and vascular, previous ablations with Dr. Aleda Grana. I do not believe there is an arterial insufficiency issue here 02/01/15 the status of the adherent eschar bilaterally is actually improved. No debridement was done. She did not manage to get vascular studies done 02/08/15 continued debridement of the area was done today. The slough is less adherent and comes off with less pressure. There is no surrounding infection peripheral pulses are intact 02/15/15 selective debridement with a disposable curette. Again the slough is less adherent and comes off with less difficulty. No surrounding infection peripheral pulses are intact. 02/22/15 selective debridement of the right medial ankle wound. Slough comes off with less difficulty. No obvious surrounding infection peripheral pulses are intact I did not debridement the one on the left. Both of these are stable to improved 03/01/15 selective debridement of both wound areas using a curette to. Adherent slough cup soft with less difficulty. No obvious  surrounding infection. The patient tells me that 2 days ago she noted a rash above the right leg wrap. She did not have this on her lower legs when she change this over she arrives with widespread left greater than right almost folliculitis-looking rash which is extremely pruritic. I don't see anything to culture here. There is no rash on the rest of her body. She feels well systemically. 03/08/15; selective debridement of both wounds using a curette. Base of this does not look unhealthy. She had limegreen drainage coming out of the left leg wound and describes a lot of drainage. The rash on her left leg looks improved to. No cultures were done. 03/22/15; patient was not here last week. Basal wounds does not look healthy and there is no surrounding erythema. No drainage. There is still a rash on the left leg that almost looks vasculitic however it is clearly limited to the top of where the wrap would be. 04/05/15; on the right required a surgical debridement of surface eschar and necrotic subcutaneous tissue. I did not debridement the area on the left. These continue to be large open wounds that are not changing that much. We were successful at one point in healing the area on the right, and at the same time the area on the left was roughly half the size of current measurements. I think a lot of the deterioration has to do with the prolonged time the patient is on her feet at work 04/19/15 I attempted-like surface debridement bilaterally she does not tolerate this. She tells me that she was in allergic care yesterday with extreme pain over her  left lateral malleolus/ankle and was told that she has an "sprain" 05/03/15; large bilateral venous insufficiency wounds over the medial malleolus/medial aspect of her ankles. She complains of copious amounts of drainage and his usual large amounts of pain. There is some increasing erythema around the wound on the right extending into the medial aspect of her  foot to. historically she came in with these wounds the right one healed and the left one came down to roughly half its current size however the right one is reopened and the left is expanded. 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 05/10/15 large bilateral venous insufficiency wounds. There is less adherence surface left however the surface culture that I did last week grew pseudomonas therefore bilateral selective debridement score necessary. There is surrounding erythema. The patient describes severe bilateral drainage and a lot of pain in the left ankle. Apparently her podiatrist was were ready to do a cortisone shot 05/17/15; the patient complains of pain and again copious amounts of drainage. 05/24/15; we used Iodo flex last week. Patient notes considerable improvement in wound drainage. Only needed to change this once. 05/31/15; we continued Iodoflex; the base of these large wounds bilaterally is not too bad but there is probably likely a significant bioburden here. I would like to debridement just doesn't tolerate it. 06/06/14 I would like to continue the Iodoflex although she still hasn't managed to obtain supplies. She has bilateral medial malleoli or large wounds which are mostly superficial. Both of them are covered circumferentially with some nonviable fibrinous slough although she tolerates debridement very poorly. She apparently has an appointment for an ablation on the right leg by interventional radiology. 06/14/15; the patient arrives with the wounds and static condition. We attempted a debridement although she does not do well with this secondary to pain. I 07/05/15; wounds are not much smaller however there appears to be a cleaner granulating base. The left has tight fibrinous slough greater than the right. Debridement is tolerated poorly due to pain. Iodoflex is done more for these wounds in any of the multitude of different  dressings I have tried on the left 1 and then subsequently the right. 07/12/15; no change in the condition of this wound. I am able to do an aggressive debridement on the right but not the left. She simply cannot tolerate it. We have been using Iodoflex which helps somewhat. It is worthwhile remembering that at one point we healed the right medial ankle wound and the left was about 25% of the current circumference. We have suggested returning to vascular surgery for review of possible further ablations for one reason or another she has not been able to do this. 07/26/15 no major change in the condition of either wound on her medial ankle. I did not attempt to debridement of these. She has been aggressively scrubbing these while she is in the shower at home. She has her supply of Iodoflex which seems to have done more for these wounds then anything I have put on recently. 08/09/15 wound area appears larger although not verified by measurements. Using Iodoflex 09/05/2015 -- she was here for avisit today but had significant problems with the wound and I was asked to see her for a physician opinion. I have summarize that this lady has had surgery on her left lower extremity about 10 years ago where the possible veins stripping was done. She has had an opinion from  interventional radiology around November 2016 where no further sclerotherapy was ordered. The patient works 12 hours a day and stands on a concrete floor with work boots and is unable to get the proper compression she requires and cannot elevate her limbs appropriately at any given time. She has recently grown Pseudomonas from her wound culture but has not started her ciprofloxacin which was called in for her. 09/13/15 this continues to be a difficult situation for this patient. At one point I had this wound down to a 1.5 x 1.5" wound on her left leg. This is deteriorated and the right leg has reopened. She now has substantial wounds on  her medial calcaneus, malleoli and into her lower leg. One on the left has surface eschar but these are far too painful for me to debridement here. She has a vascular surgery appointment next week to see if anything can be done to help here. I think she has had previous ablations several years ago at [facility]. She has no major edema. She tells me that she did not get product last time [facility]] and went for several days without it. She continues to work in work boots 12 hours a day. She cannot get compression/4-layer under her work boots. 09/20/15 no major change. Periwound edema control was not very good. Her point with pain and vascular is next Wednesday the 25th 09/28/15; the patient is seen vascular surgery and is apparently scheduled for repeat duplex ultrasounds of her bilateral lower legs next week. 10/05/15; the patient was seen by Dr. Doren Custard of vascular surgery. He feels that she should have arterial insufficiency excluded as cause/contributed to her nonhealing stage she is therefore booked for an arteriogram. She has apparently monophasic signals in the dorsalis pedis pulses. She also of course has known severe chronic venous insufficiency with previous procedures as noted previously. I had another long discussion with the patient today about her continuing to work 12 hour shifts. I've written her out for 2 months area had concerns about this as her work location is currently undergoing significant turmoil and this may lead to her termination. She is aware of this however I agree with her that she simply cannot continue to stand for 12 hours multiple days a week with the substantial wound areas she has. 10/19/15; the Dr. Doren Custard appointment was largely for an arteriogram which was normal. She does not have an arterial issue. He didn't make a comment about her chronic venous insufficiency for which she has had previous ablations. Presumably it was not felt that anything additional could  be done. The patient is now out of work as I prescribed 2 weeks ago. Her wounds look somewhat less aggravated presumably because of this. I felt I would give debridement another try today 10/25/15; no major change in this patient's wounds. We are struggling to get her product that she can afford into her own home through her insurance. 11/01/15; no major change in the patient's wounds. I have been using silver alginate as the most affordable product. I spoke to Dr. Marla Roe last week with her requested take her to the OR for surgical debridement and placement of ACEL. Dr. Marla Roe told me that she would be willing to do this however [facility] will not cover this, fortunately the patient has [place] healthcare of some variant 11/08/15; no major change in the patient's wounds. She has been completely nonviable surface that this but is in too much pain with any attempted debridement are clinic.  I have arranged for her to see Dr. Marla Roe ham of plastic surgery and this appointment is on Monday. I am hopeful that they will take her to the OR for debridement, possible ACEL ultimately possible skin graft 11/22/15 no major change in the patient's wounds over her bilateral medial calcaneus medial malleolus into the lower legs. Surface on these does not look too bad however on the left there is surrounding erythema and tenderness. This may be cellulitis or could him sleepy tinea. 11/29/15; no major changes in the patient's wounds over her bilateral medial malleolus. There is no infection here and I don't think any additional antibiotics are necessary. There is now plan to move forward. She sees Dr. Marla Roe in a week's time for preparation for operative debridement and ACEL placement I believe on 7/12. She then has a follow-up appointment with Dr. Marla Roe on 7/21 12/28/15; the patient returns today having been taken to the [facility] by Dr. Marla Roe 12/12/15 she underwent debridement,  intraoperative cultures [which were negative]. She had placement of a wound VAC. Parent really ACEL was not available to be placed. The wound VAC foam apparently adhered to the wound since then she's been using silver alginate, Xeroform under Ace wraps. She still says there is a lot of drainage and a lot of pain 01/31/16; this is a patient I see monthly. I had referred her to Dr. Marla Roe him of plastic surgery for large wounds on her bilateral medial ankles. She has been to the OR twice once in early July and once in early August. She tells me over the last 3 weeks she has been using the wound VAC with ACEL underneath it. On the right we've simply been using silver alginate. Under Kerlix Coban wraps. 02/28/16; this is a patient I'm currently seeing monthly. She is gone on to have a skin graft over her large venous insufficiency ulcer on the left medial ankle. This was done by Dr. Marla Roe him. The patient is a bit perturbed about why she didn't have one on her right medial ankle wound. She has been using silver alginate to this. 03/06/16; I received a phone call from her plastic surgery Dr. Marla Roe. She expressed some concern about the viability of the skin graft she did on the left medial ankle wound. Asked me to place Endoform on this. She told me she is not planning to do a subsequent skin graft on the right as the left one did not take very well. I had placed Hydrofera Blue on the right 03/13/16; continue to have a reasonably healthy wound on the right medial ankle. Down to 3 mm in terms of size. There is epithelialization here. The area on the left medial ankle is her skin graft site. I suppose the last week this looks somewhat better. She has an open area inferiorly however in the center there appears to be some viable tissue. There is a lot of surface callus and eschar that will eventually need to come off however none of this looked to be infected. Patient states that the is able to  keep the dressing on for several days which is an improvement. 03/20/16 no major change in the circumference of either wound however on the left side the patient was at Dr. Eusebio Friendly office and they did a debridement of left wound. 50% of the wound seems to be epithelialized. I been using Endoform on the left Hydrofera Blue in the right 03/27/16; she arrives today with her wound is not looking as healthy as  they did last week. The area on the right clearly has an adherent surface to this a very similar surface on the left. Unfortunately for this patient this is all too familiar problem. Clearly the Endoform is not working and will need to change that today that has some potential to help this surface. She does not tolerate debridement in this clinic very well. She is changing the dressing wants 04/03/16; patient arrives with the wounds looking somewhat better especially on the right. Dr. Migdalia Dk change the dressing to silver alginate when she saw her on Monday and also sold her some compression socks. The usefulness of the latter is really not clear and woman with severely draining wounds. 04/10/16; the patient is doing a bit of an experiment wearing the compression stockings that Dr. Migdalia Dk provided her to her left leg and the out of legs based dressings that we provided to the right. 05/01/16; the patient is continuing to wear compression stockings Dr. Migdalia Dk provided her on the left that are apparently silver impregnated. She has been using Iodoflex to the right leg wound. Still a moderate amount of drainage, when she leaves here the wraps only last for 4 days. She has to change the stocking on the left leg every night 05/15/16; she is now using compression stockings bilaterally provided by Dr. Marla Roe. She is wearing a nonadherent layer over the wounds so really I don't think there is anything specific being done to this now. She has some reduction on the left wound. The right is stable. I  think all healing here is being done without a specific dressing 06/09/16; patient arrives here today with not much change in the wound certainly in diameter to large circular wounds over the medial aspect of her ankle bilaterally. Under the light of these services are certainly not viable for healing. There is no evidence of surrounding infection. She is wearing compression stockings with some sort of silver impregnation as prescribed by Dr. Marla Roe. She has a follow-up with her tomorrow. 06/30/16; no major change in the size or condition of her wounds. These are still probably covered with a nonviable surface. She is using only her purchase stockings. She did see Dr. Marla Roe who seemed to want to apply Dakin's solution to this I'm not extreme short what value this would be. I would suggest Iodoflex which she still has at home. 07/28/16; I follow Mrs. Purdy episodically along with Dr. Marla Roe. 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 She has been applying a topical collagen ointment to both wounds with Adaptic. I don't think Dr. Marla Roe is planning to take her back to the OR. 08/19/16; I follow Mrs. Jeneen Rinks on a monthly basis along with Dr. Marla Roe of plastic surgery. 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 I been following her for a number of years. At one point I was able to get the right medial malleolus wound to heal and had the left medial malleolus down to about half its current size however and I had to send her to plastic surgery for an operative debridement. Since then things have been stable to slightly improve the area on the right is slightly better one in the left about the same although there is much less adherent surface than I'm used to with this patient. She is using some form of liquid collagen gel that Dr. Marla Roe provided a Kerlix  cover with the  patient's own pressure stockings. She tells me that she has extreme pain in both ankles and along the lateral aspect of both feet. She has been unable to work for some period of time. She is telling me she is retiring at the beginning of April. She sees Dr. Doran Durand of orthopedics next week 09/22/16; patient has not seen Dr. Marla Roe since the last time she is here. I'm not really sure what she is using to the wounds other than bits and pieces of think she had left over including most recently Hydrofera Blue. She is using juxtalite stockings. She is having difficulty with her husband's recent illness "stroke". She is having to transport him to various doctors appointments. Dr. Marla Roe left her the option of a repeat debridement with ACEL however she has not been able to get the time to follow-up on this. She continues to have a fair amount of drainage out of these wounds with certainly precludes leaving dressings on all week 10/13/16; patient has not seen Dr. Marla Roe since she was last in our clinic. I'm not really sure what she is doing with the wounds, we did try to get her [facility] and I think she is actually using this most of the time. Because of drainage she states she has to change this every second day although this is an improvement from what she used to do. She went to see Dr. Doran Durand who did not think she had a muscular issue with regards to her feet, he referred her to a neurologist and I think the appointment is sometime in June. I changed her back to Iodoflex which she has used in the past but not recently. 11/03/16; the patient has been using Iodoflex although she ran out of this. Still claims that there is a lot of drainage although the wound does not look like this. No surrounding erythema. She has not been back to see Dr. Marla Roe 11/24/16; the patient has been using Iodoflex again but she ran out of it 2 or 3 days ago. There is no major change in the  condition of either one of these wounds in fact they are larger and covered in a thick adherent surface slough/nonviable tissue especially on the left. She does not tolerate mechanical debridement in our clinic. Going back to see Dr. Marla Roe of plastic surgery for an operative debridement would seem reasonable. 12/15/16; the patient has not been back to see Dr. Marla Roe. She is been dealing with a series of illnesses and her husband which of monopolized her time. She is been using Sorbact which we largely supplied. She states the drainage is bad enough that it maximum she can go 2-3 days without changing the dressing 01/12/2017 -- the patient has not been back for about 4 weeks and has not seen Dr. Marla Roe not does she have any appointment pending. 01/23/17; patient has not seen Dr. Marla Roe even though I suggested this previously. She is using Santyl that was suggested last week by Dr. Con Memos this Cost her $16 through her insurance which is indeed surprising 02/12/17; continuing Santyl and the patient is changing this daily. A lot of drainage. She has not been back to see plastic surgery she is using an Ace wrap. Our intake nurse suggested wrap around stockings which would make a good reasonable alternative 02/26/17; patient is been using Santyl and changing this daily due to drainage. She has not been to see plastic surgery she uses in April Ace wrap to control the edema. She did obtain  extremitease stockings but stated that the edema in her leg was to big for these 03/20/17; patient is using Santyl and Anasept. Surfaces looked better today the area on the right is actually measuring a little smaller. She has states she has a lot of pain in her feet and ankles and is asking for a consult to pain control which I'll try to help her with through our case manager. 04/10/17; the patient arrives with better-looking wound surfaces and is slightly smaller wound on the left she is using a  combination of Santyl and Anasept. She has an appointment or at least as started in the pain control center associated with [facility] 05/14/17; this is a patient who I followed for a prolonged period of time. 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 At one point I had this down to a much smaller wound on the left however these reopened and we've never been able to get these to heal. She has been using Santyl and Anasept gel although 2 weeks ago she ran out of the Anasept gel. She has a stable appearance of the wound. She is going to the wound care clinic at [facility]. They wanted do a nerve block/spinal block although she tells me she is reluctant to go forward with that. 05/21/17; this is a patient I have followed for many years. 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 Chronic pain and deformity in her ankles as well. She is been to see plastic surgery as well as orthopedics. Using PolyMem AG most recently/Kerramax/ABDs and 2 layer compression. She has managed to keep this on and she is coming in for a nurse check to change the dressing on Tuesdays, we see her on Fridays 06/05/17; really quite a good looking surface and the area especially on the right medial has contracted in terms of dimensions. Well granulated healthy-looking tissue on both sides. Even with an open curet there is nothing that even feels abnormal here. This is as good as I've seen this in quite some time. We have been using PolyMem AG and bringing her in for a nurse check 06/12/17; really quite good surface on both of these wounds. The right medial has contracted a bit left is not. We've been using PolyMem and AG and she is coming in for a nurse visit 06/19/17; we have been using PolyMem AG and bringing her in for a nurse check. Dimensions of her wounds are not better but the surfaces looked better bilaterally. She complained of bleeding last night and the left  wound and increasing pain bilaterally. She states her wound pain is more neuropathic than just the wounds. There was some suggestion that this was radicular from her pain management doctor in talking to her it is really difficult to sort this out. 06/26/17; using PolyMem and AG and bringing her in for a nurse check as All of this and reasonably stable condition. Certainly not improved. The dimensions on the lateral part of the right leg look better but not really measuring better. The medial aspect on the left is about the same. 07/03/16; we have been using PolyMen AG and bringing her in for a nurse check to change the dressings as the wounds have drainage which precludes once weekly changing. We are using all secondary absorptive dressings.our intake nurse is brought up the idea of using a wound VAC/snap VAC on the wound to help with the drainage to see if this would result in some contraction.  This is not a bad idea. The area on the right medial is actually looking smaller. Both wounds have a reasonable-looking surface. There is no evidence of cellulitis. The edema is well controlled 07/10/17; the patient was denied for a snap VAC by her insurance. The major issue with these wounds continues to be drainage. We are using wicked PolyMem AG and she is coming in for a nurse visit to change this. The wounds are stable to slightly improved. The surface looks vibrant and the area on the right certainly has shrunk in size but very slowly 07/17/17; the patient still has large wounds on her bilateral medial malleoli. Surface of both of these wounds looks better. The dimensions seem to come and go but no consistent improvement. There is no epithelialization. We do not have options for advanced treatment products due to insurance issues. They did not approve of the wound VAC to help control the drainage. More recently we've been using PolyMem and AG wicked to allow drainage through. We have been bringing her in  for a nurse visit to change this. We do not have a lot of options for wound care products and the home again due to insurance issues 07/24/17; the patient's wound actually looks somewhat better today. No drainage measurements are smaller still healthy-looking surface. We used silver collagen under PolyMen started last week. We have been bringing her in for a dressing change 07/31/17; patient's wound surface continued to look better and I think there is visible change in the dimensions of the wound on the right. Rims of epithelialization. We have been using silver collagen under PolyMen and bringing her in for a dressing change. There appears to be less drainage although she is still in need of the dressing change 08/07/17. Patient's wound surface continues to look better on both sides and the area on the right is definitely smaller. We have been using silver collagen and PolyMen. She feels that the drainage has been it has been better. I asked her about her vascular status. She went to see Dr. Aleda Grana at [facility] and had some form of ablation. I don't have much detail on this. I haven't my notes from 2016 that she was not a candidate for any further ablation but I don't have any more information on this. We had referred her to vein and vascular I don't think she ever went. He does not have a history of PAD although I don't have any information on this either. We don't even have ABIs in our record 08/14/17; we've been using silver collagen and PolyMen cover. And putting the patient and compression. She we are bringing her in as a nurse visit to change this because ofarge amount of drainage. We didn't the ABIs in clinic today since they had been done in many moons 1.2 bilaterally. She has been to see vein and vascular however this was at [facility] and she had ablation although I really don't have any information on this all seemed biking get a report. She is also been operatively debrided by  plastic surgery and had a cell placed probably 8-12 months ago. This didn't have a major effect. We've been making some gains with current dressings 08/19/17-She is here in follow-up evaluation for bilateral medial malleoli ulcers. She continues to tolerate debridement very poorly. We will continue with recently changed topical treatment; if no significant improvement may consider switching to Iodosorb/Iodoflex. 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 She has been  using silver collagen and PolyMem. I believe she has been used and tried on Iodoflex before. 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 These have been very refractory over time. We have been using silver collagen and PolyMen. She is coming in once a week for a doctor's and once a week for nurse visits. We are actually making some progress 09/18/17; the patient's wounds are smaller especially on the right medial. She arrives today to upset to consider even washing these off with Anasept which I think is been part of the reason this is been closing. We've been using collagen covered in PolyMen otherwise. It is noted that she has a small area of folliculitis on the right medial calf that. As we are wrapping her legs I'll give her a short course of doxycycline to make sure this doesn't amount to anything. She is a long list of complaints today including imbalance, shortness of breath on exertion, inversion of her left ankle. With regards to the latter complaints she is been to see orthopedics and they offered her a tendon release surgery I believe but wanted her wounds to be closed first. I have recommended she go see her primary physician with regards to everything else. 09/25/17; patient's wounds  are about the same size. We have made some progress bilaterally although not in recent weeks. She will not allow me T wash these wounds with Anasept even if she is doing her cell. Wheeze we've been using collagen covered in PolyMen. Last week she had a small area of folliculitis this is now opened into a small wound. She completed 5 days of trimethoprim sulfamethoxazole 10/02/17; unfortunately the area on her left medial ankle is worse with a larger wound area towards the Achilles. The patient complains of a lot of pain. She will not allow debridement although visually I don't think there is anything to debridement in any case. We have been using silver collagen and PolyMen for several months now. Initially we are making some progress although I'm not really seeing that today. We will move back to [facility]. His admittedly this is a bit of a repeat however I'm hoping that his situation is different now. The patient tells me she had her leg on the left give out on her yesterday this is process some pain. 10/09/17; the patient is seen twice a week largely because of drainage issues coming out of the chronic medial bimalleolar wounds that are chronic. Last week the dimensions of the one on the left looks a little larger I changed her to [facility]. She comes in today with a history of terrible pain in the bilateral wound areas. She will not allow debridement. She will not even allow a tissue culture. There is no surrounding erythema no no evidence of cellulitis. We have been putting her Kerlix Coban man. She will not allow more aggressive compression as there was a suggestion to put her in 3 layer wraps. 10/16/17; large wounds on her bilateral medial malleoli. These are chronic. Not much change from last week. The surface looks have healthy but absolutely no epithelialization. A lot of pain little less so of drainage. She will not allow debridement or even washing these off in the vigorous  fashion with Anasept. 10/23/17; large wounds on her bilateral malleoli which are chronic. Some improvement  in terms of size perhaps on the right since last time I saw these. She states that after we increased the 3 layer compression there was some bleeding, when she came in for a nurse visit she did not want 3 layer compression put back on about our nurse managed to convince her. She has known chronic venous visit issues and I'm hoping to get her to tolerate the 3 layer compression. using Hydrofera Blue 10/30/17; absolutely no change in the condition of either wound although we've had some improvement in dimensions on the right.. Attempted to put her in 3 layer compression she didn't tolerated she is back in 2 layer compression. We've been using Hydrofera Blue We looked over her past records. She had venous reflux studies in November 2016. There was no evidence of deep venous reflux on the right. Superficial vein did not show the greater saphenous vein at think this is been previously ablated the small saphenous vein was within normal limits. The left deep venous system showed no DVT the vessels were positive for deep venous reflux in the posterior tibial veins at the ankle. The greater saphenous vein was surgically absent small saphenous vein was within normal limits. She went to vein and vascular at [facility]. I believe she had an ablation on the left greater saphenous vein. I'll update her reflux studies perhaps ever reviewed by vein and vascular. We've made absolutely no progress in these wounds. Will also try to read and TheraSkins through her insurance 11/06/17; W the patient apparently has a 2 week follow-up with vein and vascular I like him to review the whole issue with regards to her previous vascular workup by Dr. Aleda Grana. We've really made no progress on these wounds in many months. She arrives today with less viable looking surface on the left medial ankle wound. This  was apparently looking about the same on Tuesday when she was here for nurse visit. 11/13/17; deep tissue culture I did last time of the left lower leg showed multiple organisms without any predominating. In particular no Staphylococcus or group A strep were isolated. We sent her for venous reflux studies. She's had a previous left greater saphenous vein stripping and I think sclerotherapy of the right greater saphenous vein. She didn't really look at the lesser saphenous vein this both wounds are on the medial aspect. She has reflux in the common femoral vein and popliteal vein and an accessory vein on the right and the common femoral vein and popliteal vein on the left. I'm going to have her go to see vein and vascular just the look over things and see if anything else beside aggressive compression is indicated here. We have not been able to make any progress on these wounds in spite of the fact that the surface of the wounds is never look too bad. 11/20/17; no major change in the condition of the wounds. Patient reports a large amount of drainage. She has a lot of complaints of pain although enlisting her today I wonder if some of this at least his neuropathic rather than secondary to her wounds. She has an appointment with vein and vascular on 12/30/17. The refractory nature of these wounds in my mind at least need vein and vascular to look over the wounds the recent reflux studies we did and her history to see if anything further can be done here. I also note her gait is deteriorated quite a bit. 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  She has a bilateral Trendelenburg gait. I wonder if this is neuropathic or perhaps multilevel radicular. 11/27/17; her wounds actually looks slightly better. Healthy-looking granulation tissue a scant amount of epithelialization. [place] healthcare will not pay for [employer]. They will play for tri layer Oasis and Dermagraft. This is not a diabetic  ulcer. We'll try for the tri layer Oasis. She still complains of some drainage. She has a vein and vascular appointment on 12/30/17 12/04/17; the wounds visually look quite good. Healthy-looking granulation with some degree of epithelialization. We are still waiting for response to our request for trial to try layer Oasis. Her appointment with vascular to review venous and arterial issues isn't sold the end of July 7/31. Not allow debridement or even vigorous cleansing of the wound surface. 12/18/17; slightly smaller especially on the right. 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 We've been using Hydrofera Blue. We still are looking into triple layer Oasis through her insurance 01/08/18 on evaluation today patient's wound actually appears to be showing signs of good improvement at this point in time. She has been tolerating the dressing changes without complication. Fortunately there does not appear to be any evidence of infection at this point in time. We have been utilizing silver nitrate which does seem to be of benefit for her which is also good news. Overall I'm very happy with how things seem to be both regards appearance as well as measurement. Patient did see Dr. Bridgett Larsson for evaluation on 12/30/17. In his assessment he felt that stripping would not likely add much more than chronic compression to the patient's healing process. His recommendation was to follow-up in three months with Dr. Doren Custard if she hasn't healed in order to consider referral back to you and see vascular where she previously was in a trial and was able to get her wound to heal. I'll be see what she feels she when you staying compression and he reiterated this as well. 01/13/18 on evaluation today patient appears to actually be doing very well in regard to her bilateral medial malleolus ulcers. She seems to have tolerated the chemical cauterization with silver nitrate last week she did have some  pain through that evening but fortunately states that I'll be see since it seems to be doing better she is overall pleased with the progress. 01/21/18; really quite a remarkable improvement since I've last seen these wounds. We started using silver nitrate specially on the islands of hyper granulation which for some reason her around the wound circumference. This is really done quite nicely. Primary dressing Hydrofera Blue under 4 layer compression. She seems to be able to hold out without a nurse rewrap. Follow-up in 1 week 01/28/18; we've continued the hydrofera blue but continued with chemical cauterization to the wound area that we started about a month ago for irregular hyper granulation. She is made almost stunning improvement in the overall wound dimensions. I was not really expecting this degree of improvement in these chronic wounds 02/05/18; we continue with Hydrofera Bluebut of also continued the aggressive chemical cauterization with silver nitrate. We made nice progress with the right greater than left wound. 02/12/18. We continued with Hydrofera Blue after aggressive chemical cauterization with silver nitrate. We appear to be making nice progress with both wound areas 02/19/2018; we continue with [facility] after washing the wounds vigorously with Anasept spray and chemical cauterization with silver nitrate. We are making excellent progress. The area on the right's just about closed 02/26/2018. The area on the  left medial ankle had too much necrotic debris today. I used a #5 curette we are able to get most of the soft. I continued with the silver nitrate to the much smaller wound on the right medial ankle she had a new area on her right lower pretibial area which she says was due to a role in her compression 03/05/2018; both wound areas look healthy. Not much change in dimensions from last week. I continue to use silver nitrate and Hydrofera Blue. The patient saw Dr. Doren Custard of vein and  vascular. He felt she had venous stasis ulcers. He felt based on her previous arteriogram she should have adequate circulation for healing. Also she has deep venous reflux but really no significant correctable superficial venous reflux at this time. He felt we should continue with conservative management including leg elevation and compression 04/02/2018; since we last saw this woman about a month ago she had a fall apparently suffered a pelvic fracture. I did not look up the x-ray. Nevertheless because of pain she literally was bedbound for 2 weeks and had home health coming out to change the dressing. Somewhat predictably this is resulted in considerable improvement in both wound areas. The right is just about closed on the medial malleolus and the left is about half the size. 04/16/2018; both her wounds continue to go down in size. Using Hydrofera Blue. 05/07/18; both her wounds appeared to be improving especially on the right where it is almost closed. We are using Hydrofera Blue 05/14/2018; slightly worse this week with larger wounds. Surface on the left medial not quite as good. We have been using Hydrofera Blue 05/21/18; again the wounds are slightly larger. Left medial malleolus slightly larger with eschar around the circumference. We have been using Hydrofera Blue undergoing a wraps for a prolonged period of time. This got a lot better when she was more recumbent due to a fall and a back injury. I change the primary dressing the silver alginate today. She did not tolerate a 4 layer compression previously although I may need to bring this up with her next time 05/28/2018; area on the left medial malleolus again is slightly larger with more drainage. Area on the right is roughly unchanged. She has a small area of folliculitis on the right medial just on the lower calf. This does not look ominous. 06/03/2018 left medial malleolus slightly smaller in a better looking surface. We used silver  nitrate on this last time with silver alginate. The area on the right appears slightly smaller 1/10; left medial malleolus slightly smaller. Small open area on the right. We used silver nitrate and silver alginate as of 2 weeks ago. We continue with the wound and compression. These got a lot better when she was off her feet 1/17; right medial malleolus wound is smaller. The left may be slightly smaller. Both surfaces look somewhat better. 1/24; both wounds are slightly smaller. Using silver alginate under Unna boots 1/31; both wounds appear smaller in fact the area on the right medial is just about closed. Surface eschar. We have been using silver alginate under Unna boots. The patient is less active now spends let much less time on her feet and I think this is contributed to the general improvement in the wound condition 2/7; both wounds appear smaller. I was hopeful the right medial would be closed however there there is still the same small open area. Slight amount of surface eschar on the left the dimensions are smaller there is  eschar but the wound edges appear to be free. We have been using silver alginate under Unna boot's 2/14; both wounds once again measure smaller. Circumferential eschar on the left medial. We have been using silver alginate under Unna boots with gradual improvement 2/21; the area on the right medial malleolus has healed. The area on the left is smaller. We have been using silver alginate and Unna boots. We can discharge wrapping the right leg she has 20/30 stockings at home she will need to protect the scar tissue in this area 2/28; the area on the right medial malleolus remains closed the patient has a compression stocking. The area on the left is smaller. We have been using silver alginate and Unna boots. 3/6 the area on the right medial ankle remains closed. Good edema control noted she is using her own compression stocking. The area on the left medial ankle is  smaller. We have been managing this with silver alginate and Unna boots which we will continue today. 3/13; the area on the right medial ankle remains closed and I'm declaring it healed today. When necessary the left is about the same still a healthy-looking surface but no major change and wound area. No evidence of infection and using silver alginate under unna and generally making considerable improvement 3/27 the area on the right medial ankle remains closed the area on the left is about the same as last week. Certainly not any worse we have been using silver alginate under an Unna boot 4/3; the area on the right medial ankle remains closed per the patient. We did not look at this wound. The wound on the left medial ankle is about the same surface looks healthy we have been using silver alginate under an Unna boot 4/10; area on the right medial ankle remains closed per the patient. We did not look at this wound. The wound on the left medial ankle is slightly larger. The patient complains that the [facility] caused burning pain all week. She also told us that she was a lot more active this week. Changed her back to silver alginate 4/17; right medial ankle still closed per the patient. Left medial ankle is slightly larger. Using silver alginate. She did not tolerate Hydrofera Blue on this area 4/24; right medial ankle remains closed we have not look at this. The left medial ankle continues to get larger today by about a centimeter. We have been using silver alginate under Unna boots. She complains about 4 layer compression as an alternative. She has been up on her feet working on her garden 5/8; right medial ankle remains closed we did not look at this. The left medial ankle has increased in size about 100%. We have been using silver alginate under Unna boots. She noted increased pain this week and was not surprised that the wound is deteriorated 5/15; no major change in SA however much less  erythema ( one week of doxy ocellulitis). 5/22-63 year old female returns at 1 week to the clinic for left medial ankle wound for which we have been using silver alginate under 3 layer compression She was placed on DOXY at last visit - the wound is wider at this visit. She is in 3 layer compression 5/29; change to [facility] last week. I had given her empiric doxycycline 2 weeks ago for a week. She is in 3 layer compression. She complains of a lot of pain and drainage on presentation today. 6/5; using Hydrofera Blue. I gave her doxycycline recently  empirically for erythema and pain around the wound. Believe her cultures showed enterococcus which not would not have been well covered by doxycycline nevertheless the wound looks better and I don't feel specifically that the enterococcus needs to be covered. She has a new what looks like a wrap injury on her lateral left ankle. 6/12; she is using Hydrofera Blue. She has a new area on the left anterior lower tibial area. This was a wrap injury last week. 6/19; the patient is using Hydrofera Blue. She arrived with marked inflammation and erythema around the wound and tenderness. 12/01/18 on evaluation today patient appears to be doing a little bit better based on what I'm hearing from the standpoint of lassos evaluation to this as far as the overall appearance of the wound is concerned. Then sometime substandard she typically sees Dr. Dellia Nims. Nonetheless overall very pleased with the progress that she's made up to this point. No fevers, chills, nausea, or vomiting noted at this time. 7/10; some improvement in the surface area. Aggressively debrided last week apparently. I went ahead with the debridement today although the patient does not tolerate this very well. We have been using Iodoflex. Still a fair amount of drainage 7/17; slightly smaller. Using Iodoflex. 7/24; no change from last week in terms of surface area. We have been using Iodoflex.  Surface looks and continues to look somewhat better 7/31; surface area slightly smaller better looking surface. We have been using Iodoflex. This is under Unna boot compression 8/7-Patient presents at 1 week with Unna boot and Iodoflex, wound appears better 8/14-Patient presents at 1 week with Iodoflex, we use the Unna boot, wound appears to be stable better.Patient is getting Botox treatment for the inversion of the foot for tendon release, Next week 8/21; we are using Iodoflex. Unna boot. The wound is stable in terms of surface area. Under illumination there is some areas of the wound that appear to be either epithelialized or perhaps this is adherent slough at this point I was not really clear. It did not wipe off and I was reluctant to debride this today. 8/28; we are using Iodoflex in an Unna boot. Seems to be making good improvement. 9/4; using Iodoflex and wound is slightly smaller. 9/18; we are using Iodoflex with topical silver nitrate when she is here. The wound continues to be smaller 10/2; patient missed her appointment last week due to GI issues. She left and Iodoflex based dressing on for 2 weeks. Wound is about the same size about the size of a dime on the left medial lower leg Electronic Signature(s) Signed: 03/04/2019 6:19:43 PM By: Linton Ham MD Entered By: Linton Ham on 03/04/2019 11:20:33 -------------------------------------------------------------------------------- Physical Exam Details Patient Name: Date of Service: [name], [name] 03/04/2019 10:15 AM Medical Record [id number] Patient Account Number: [id number] Date of Birth/Sex: Treating RN: [birth year] (63 y.o. [name] Provider: [name] Other Clinician: [name] Referring Provider: Treating Provider/Extender:Shawna Kiener, Tawny Asal in Treatment: 325 Constitutional Wide pulse pressure however the patient looks well. Pulse regular and within  target range for patient.Marland Kitchen Respirations regular, non-labored and within target range.. Temperature is normal and within the target range for the patient.Marland Kitchen Appears in no distress. Eyes Conjunctivae clear. No discharge.no icterus. Respiratory work of breathing is normal. Cardiovascular Edema control is good. Integumentary (Hair, Skin) No evidence of surrounding erythema. Psychiatric appears at normal baseline. Notes Wound exam; left medial malleolus. Generally healthy looking wound bed. There was no hyper granulation no drainage no  evidence of surrounding infection Electronic Signature(s) Signed: 03/04/2019 6:19:43 PM By: Linton Ham MD Entered By: Linton Ham on 03/04/2019 11:22:10 -------------------------------------------------------------------------------- Physician Orders Details Patient Name: Date of Service: [name], [name] 03/04/2019 10:15 AM Medical Record [id number] Patient Account Number: [id number] Date of Birth/Sex: Treating RN: 05/23/1956 (63 y.o. 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 Dressing Change Frequency Wound #3 Left,Medial Malleolus Do not change entire dressing for one week. Skin Barriers/Peri-Wound Care Wound #3 Left,Medial Malleolus TCA Cream or Ointment - to leg Wound Cleansing Wound #3 Left,Medial Malleolus Clean wound with Wound Cleanser - clean with anasept with dressing changes May shower with protection. Primary Wound Dressing Wound #3 Left,Medial Malleolus Iodoflex Secondary  Dressing Wound #3 Left,Medial Malleolus Dry Gauze Kerramax Edema Control Unna Boot to Left Lower Extremity - no kerlix layer Avoid standing for long periods of time Elevate legs to the level of the heart or above for 30 minutes daily and/or when sitting, a frequency of: Support Garment 20-30 mm/Hg pressure to: - compression stocking right leg daily Electronic Signature(s) Signed: 03/04/2019 6:19:43 PM By: Linton Ham MD Signed: 03/04/2019 7:05:19 PM By: Baruch Gouty RN, BSN Entered By: Baruch Gouty on 03/04/2019 11:06:59 -------------------------------------------------------------------------------- Problem List Details Patient Name: Date of Service: [name] 03/04/2019 10:15 AM Medical Record [id number] Patient Account Number: [id number] Date of Birth/Sex: Treating RN: [birth year] (63 y.o. 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 11/16/2014 No Yes of calf and inflammation Inactive Problems ICD-10 Code Description Active Date Inactive Date I83.223 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 08/03/2014 08/03/2014 and inflammation L03.116 Cellulitis of left lower limb 09/07/2014 09/07/2014 Resolved Problems ICD-10 Code Description Active Date Resolved Date L97.312 Non-pressure chronic ulcer of right ankle with fat layer 04/10/2016 04/10/2016 exposed Electronic Signature(s) Signed: 03/04/2019 6:19:43 PM By: Linton Ham MD Entered By: Linton Ham on 03/04/2019  11:18:44 -------------------------------------------------------------------------------- Progress Note Details Patient Name: Date of Service: [name] 03/04/2019 10:15 AM Medical Record [id number] Patient Account Number: [id number] Date of Birth/Sex: Treating RN: 15-Sep-1955 (63 y.o. [name] Provider: [name] Other Clinician: [name] Referring Provider: Treating Provider/Extender:Yahsir Wickens, Tawny Asal in Treatment: 325 Subjective History of Present Illness (HPI) the remaining wound is over the left medial ankle. Similar wound over the right medial ankle healed largely with use of Apligraf. Most recently we have been using Hydrofera Blue over this wound with considerable improvement. The patient has been extensively worked up in the past for her venous insufficiency and she is not a candidate for antireflux surgery although I have none of the details available currently. 08/24/14; considerable improvement today. About 50% of this wound areas now epithelialized. The base of the wound appears to be healthier granulation.as opposed to last week when she had deteriorated a considerable improvement 08/17/14; unfortunately the wound has regressed somewhat. The areas  of epithelialization from the superior aspect are not nearly as healthy as they were last week. The patient thinks her Hydrofera Blue slipped. 09/07/14; unfortunately the area has markedly regressed in the 2 weeks since I've seen this. There is an odor surrounding erythema. The healthy granulation tissue that we had at the base of the wound now is a dusky color. The nurse reports green drainage 09/14/14; the area looks somewhat better than last week. There is less erythema and less drainage. The culture I did did not show any growth. Nevertheless I think it is better to continue the Cipro and doxycycline for a further week. The remaining wound area was  debridement. 09/21/14. Wound did not require debridement last week. Still less erythema and less drainage. She can complete her antibiotics. The areas of epithelialization in the superior aspect of the wound do not look as healthy as they did some weeks ago 10/05/14 continued improvement in the condition of this wound. There is advancing epithelialization. Less aggressive debridement required 10/19/14 continued improvement in the condition and volume of this wound. Less aggressive debridement to the inferior part of this to remove surface slough and fibrinous eschar 11/02/14 no debridement is required. The surface granulation appears healthy although some of her islands of epithelialization seem to have regressed. No evidence of infection 11/16/14; lites surface debridement done of surface eschar. The wound does not look to be unhealthy. No evidence of infection. Unfortunately the patient has had podiatry issues in the right foot and for some reason has redeveloped small surface ulcerations in the medial right ankle. Her original presentation involved wounds in this area 11/23/14 no debridement. The area on the right ankle has enlarged. The left ankle wound appears stable in terms of the surface although there is periwound inflammation. There has been regression in the amount of new skin 11/30/14 no debridement. Both wound areas appear healthy. There was no evidence of infection. The the new area on the right medial ankle has enlarged although that both the surfaces appear to be stable. 12/07/14; Debridement of the right medial ankle wound. No no debridement was done on the left. 12/14/14 no major change in and now bilateral medial ankle wounds. Both of these are very painful but the no overt evidence of infection. She has had previous venous ablation 12/21/14; patient states that her right medial ankle wound is considerably more painful last week than usual. Her left is also somewhat painful. She could not  tolerate debridement. The right medial ankle wound has fibrinous surface eschar 12/28/14 this is a patient with severe bilateral venous insufficiency ulcers. For a considerable period of time we actually had the one on the right medial ankle healed however this recently opened up again in June. The left medial ankle wound has been a refractory area with some absent flows. We had some success with Hydrofera Blue on this area and it literally closed by 50% however it is recently opened up Foley. Both of these were debridement today of surface eschar. She tolerates this poorly 01/25/15: No change in the status of this. Thick adherent escar. Very poor tolerance of any attempt at debridement. I had healed the right medial malleolus wound for a considerable amount of time and had the left one down to about 50% of the volume although this is totally regressed over the last 48 weeks. Further the right leg has reopened. she is trying to make a appointment with pain and vascular, previous ablations with Dr. Aleda Grana. I do not believe  there is an arterial insufficiency issue here 02/01/15 the status of the adherent eschar bilaterally is actually improved. No debridement was done. She did not manage to get vascular studies done 02/08/15 continued debridement of the area was done today. The slough is less adherent and comes off with less pressure. There is no surrounding infection peripheral pulses are intact 02/15/15 selective debridement with a disposable curette. Again the slough is less adherent and comes off with less difficulty. No surrounding infection peripheral pulses are intact. 02/22/15 selective debridement of the right medial ankle wound. Slough comes off with less difficulty. No obvious surrounding infection peripheral pulses are intact I did not debridement the one on the left. Both of these are stable to improved 03/01/15 selective debridement of both wound areas using a curette to. Adherent  slough cup soft with less difficulty. No obvious surrounding infection. The patient tells me that 2 days ago she noted a rash above the right leg wrap. She did not have this on her lower legs when she change this over she arrives with widespread left greater than right almost folliculitis-looking rash which is extremely pruritic. I don't see anything to culture here. There is no rash on the rest of her body. She feels well systemically. 03/08/15; selective debridement of both wounds using a curette. Base of this does not look unhealthy. She had limegreen drainage coming out of the left leg wound and describes a lot of drainage. The rash on her left leg looks improved to. No cultures were done. 03/22/15; patient was not here last week. Basal wounds does not look healthy and there is no surrounding erythema. No drainage. There is still a rash on the left leg that almost looks vasculitic however it is clearly limited to the top of where the wrap would be. 04/05/15; on the right required a surgical debridement of surface eschar and necrotic subcutaneous tissue. I did not debridement the area on the left. These continue to be large open wounds that are not changing that much. We were successful at one point in healing the area on the right, and at the same time the area on the left was roughly half the size of current measurements. I think a lot of the deterioration has to do with the prolonged time the patient is on her feet at work 04/19/15 I attempted-like surface debridement bilaterally she does not tolerate this. She tells me that she was in allergic care yesterday with extreme pain over her left lateral malleolus/ankle and was told that she has an "sprain" 05/03/15; large bilateral venous insufficiency wounds over the medial malleolus/medial aspect of her ankles. She complains of copious amounts of drainage and his usual large amounts of pain. There is some increasing erythema around the wound on  the right extending into the medial aspect of her foot to. historically she came in with these wounds the right one healed and the left one came down to roughly half its current size however the right one is reopened and the left is expanded. 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 05/10/15 large bilateral venous insufficiency wounds. There is less adherence surface left however the surface culture that I did last week grew pseudomonas therefore bilateral selective debridement score necessary. There is surrounding erythema. The patient describes severe bilateral drainage and a lot of pain in the left ankle. Apparently her podiatrist was were ready to do a cortisone shot 05/17/15;  the patient complains of pain and again copious amounts of drainage. 05/24/15; we used Iodo flex last week. Patient notes considerable improvement in wound drainage. Only needed to change this once. 05/31/15; we continued Iodoflex; the base of these large wounds bilaterally is not too bad but there is probably likely a significant bioburden here. I would like to debridement just doesn't tolerate it. 06/06/14 I would like to continue the Iodoflex although she still hasn't managed to obtain supplies. She has bilateral medial malleoli or large wounds which are mostly superficial. Both of them are covered circumferentially with some nonviable fibrinous slough although she tolerates debridement very poorly. She apparently has an appointment for an ablation on the right leg by interventional radiology. 06/14/15; the patient arrives with the wounds and static condition. We attempted a debridement although she does not do well with this secondary to pain. I 07/05/15; wounds are not much smaller however there appears to be a cleaner granulating base. The left has tight fibrinous slough greater than the right. Debridement is tolerated poorly due to pain. Iodoflex is done more for  these wounds in any of the multitude of different dressings I have tried on the left 1 and then subsequently the right. 07/12/15; no change in the condition of this wound. I am able to do an aggressive debridement on the right but not the left. She simply cannot tolerate it. We have been using Iodoflex which helps somewhat. It is worthwhile remembering that at one point we healed the right medial ankle wound and the left was about 25% of the current circumference. We have suggested returning to vascular surgery for review of possible further ablations for one reason or another she has not been able to do this. 07/26/15 no major change in the condition of either wound on her medial ankle. I did not attempt to debridement of these. She has been aggressively scrubbing these while she is in the shower at home. She has her supply of Iodoflex which seems to have done more for these wounds then anything I have put on recently. 08/09/15 wound area appears larger although not verified by measurements. Using Iodoflex 09/05/2015 -- she was here for avisit today but had significant problems with the wound and I was asked to see her for a physician opinion. I have summarize that this lady has had surgery on her left lower extremity about 10 years ago where the possible veins stripping was done. She has had an opinion from interventional radiology around November 2016 where no further sclerotherapy was ordered. The patient works 12 hours a day and stands on a concrete floor with work boots and is unable to get the proper compression she requires and cannot elevate her limbs appropriately at any given time. She has recently grown Pseudomonas from her wound culture but has not started her ciprofloxacin which was called in for her. 09/13/15 this continues to be a difficult situation for this patient. At one point I had this wound down to a 1.5 x 1.5" wound on her left leg. This is deteriorated and the right leg has  reopened. She now has substantial wounds on her medial calcaneus, malleoli and into her lower leg. One on the left has surface eschar but these are far too painful for me to debridement here. She has a vascular surgery appointment next week to see if anything can be done to help here. I think she has had previous ablations several years ago at [facility]. She has no major  edema. She tells me that she did not get product last time [facility]] and went for several days without it. She continues to work in work boots 12 hours a day. She cannot get compression/4-layer under her work boots. 09/20/15 no major change. Periwound edema control was not very good. Her point with pain and vascular is next Wednesday the 25th 09/28/15; the patient is seen vascular surgery and is apparently scheduled for repeat duplex ultrasounds of her bilateral lower legs next week. 10/05/15; the patient was seen by Dr. Doren Custard of vascular surgery. He feels that she should have arterial insufficiency excluded as cause/contributed to her nonhealing stage she is therefore booked for an arteriogram. She has apparently monophasic signals in the dorsalis pedis pulses. She also of course has known severe chronic venous insufficiency with previous procedures as noted previously. I had another long discussion with the patient today about her continuing to work 12 hour shifts. I've written her out for 2 months area had concerns about this as her work location is currently undergoing significant turmoil and this may lead to her termination. She is aware of this however I agree with her that she simply cannot continue to stand for 12 hours multiple days a week with the substantial wound areas she has. 10/19/15; the Dr. Doren Custard appointment was largely for an arteriogram which was normal. She does not have an arterial issue. He didn't make a comment about her chronic venous insufficiency for which she has had previous ablations. Presumably it  was not felt that anything additional could be done. The patient is now out of work as I prescribed 2 weeks ago. Her wounds look somewhat less aggravated presumably because of this. I felt I would give debridement another try today 10/25/15; no major change in this patient's wounds. We are struggling to get her product that she can afford into her own home through her insurance. 11/01/15; no major change in the patient's wounds. I have been using silver alginate as the most affordable product. I spoke to Dr. Marla Roe last week with her requested take her to the OR for surgical debridement and placement of ACEL. Dr. Marla Roe told me that she would be willing to do this however [facility] will not cover this, fortunately the patient has [place] healthcare of some variant 11/08/15; no major change in the patient's wounds. She has been completely nonviable surface that this but is in too much pain with any attempted debridement are clinic. I have arranged for her to see Dr. Marla Roe ham of plastic surgery and this appointment is on Monday. I am hopeful that they will take her to the OR for debridement, possible ACEL ultimately possible skin graft 11/22/15 no major change in the patient's wounds over her bilateral medial calcaneus medial malleolus into the lower legs. Surface on these does not look too bad however on the left there is surrounding erythema and tenderness. This may be cellulitis or could him sleepy tinea. 11/29/15; no major changes in the patient's wounds over her bilateral medial malleolus. There is no infection here and I don't think any additional antibiotics are necessary. There is now plan to move forward. She sees Dr. Marla Roe in a week's time for preparation for operative debridement and ACEL placement I believe on 7/12. She then has a follow-up appointment with Dr. Marla Roe on 7/21 12/28/15; the patient returns today having been taken to the [facility] [place] by Dr. Marla Roe  12/12/15 she underwent debridement, intraoperative cultures [which were negative]. She had  placement of a wound VAC. Parent really ACEL was not available to be placed. The wound VAC foam apparently adhered to the wound since then she's been using silver alginate, Xeroform under Ace wraps. She still says there is a lot of drainage and a lot of pain 01/31/16; this is a patient I see monthly. I had referred her to Dr. Marla Roe him of plastic surgery for large wounds on her bilateral medial ankles. She has been to the OR twice once in early July and once in early August. She tells me over the last 3 weeks she has been using the wound VAC with ACEL underneath it. On the right we've simply been using silver alginate. Under Kerlix Coban wraps. 02/28/16; this is a patient I'm currently seeing monthly. She is gone on to have a skin graft over her large venous insufficiency ulcer on the left medial ankle. This was done by Dr. Marla Roe him. The patient is a bit perturbed about why she didn't have one on her right medial ankle wound. She has been using silver alginate to this. 03/06/16; I received a phone call from her plastic surgery Dr. Marla Roe. She expressed some concern about the viability of the skin graft she did on the left medial ankle wound. Asked me to place Endoform on this. She told me she is not planning to do a subsequent skin graft on the right as the left one did not take very well. I had placed Hydrofera Blue on the right 03/13/16; continue to have a reasonably healthy wound on the right medial ankle. Down to 3 mm in terms of size. There is epithelialization here. The area on the left medial ankle is her skin graft site. I suppose the last week this looks somewhat better. She has an open area inferiorly however in the center there appears to be some viable tissue. There is a lot of surface callus and eschar that will eventually need to come off however none of this looked to be infected.  Patient states that the is able to keep the dressing on for several days which is an improvement. 03/20/16 no major change in the circumference of either wound however on the left side the patient was at Dr. Eusebio Friendly office and they did a debridement of left wound. 50% of the wound seems to be epithelialized. I been using Endoform on the left Hydrofera Blue in the right 03/27/16; she arrives today with her wound is not looking as healthy as they did last week. The area on the right clearly has an adherent surface to this a very similar surface on the left. Unfortunately for this patient this is all too familiar problem. Clearly the Endoform is not working and will need to change that today that has some potential to help this surface. She does not tolerate debridement in this clinic very well. She is changing the dressing wants 04/03/16; patient arrives with the wounds looking somewhat better especially on the right. Dr. Migdalia Dk change the dressing to silver alginate when she saw her on Monday and also sold her some compression socks. The usefulness of the latter is really not clear and woman with severely draining wounds. 04/10/16; the patient is doing a bit of an experiment wearing the compression stockings that Dr. Migdalia Dk provided her to her left leg and the out of legs based dressings that we provided to the right. 05/01/16; the patient is continuing to wear compression stockings Dr. Migdalia Dk provided her on the left that are apparently silver  impregnated. She has been using Iodoflex to the right leg wound. Still a moderate amount of drainage, when she leaves here the wraps only last for 4 days. She has to change the stocking on the left leg every night 05/15/16; she is now using compression stockings bilaterally provided by Dr. Marla Roe. She is wearing a nonadherent layer over the wounds so really I don't think there is anything specific being done to this now. She has some reduction on the  left wound. The right is stable. I think all healing here is being done without a specific dressing 06/09/16; patient arrives here today with not much change in the wound certainly in diameter to large circular wounds over the medial aspect of her ankle bilaterally. Under the light of these services are certainly not viable for healing. There is no evidence of surrounding infection. She is wearing compression stockings with some sort of silver impregnation as prescribed by Dr. Marla Roe. She has a follow-up with her tomorrow. 06/30/16; no major change in the size or condition of her wounds. These are still probably covered with a nonviable surface. She is using only her purchase stockings. She did see Dr. Marla Roe who seemed to want to apply Dakin's solution to this I'm not extreme short what value this would be. I would suggest Iodoflex which she still has at home. 07/28/16; I follow Mrs. Wofford episodically along with Dr. Marla Roe. 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 She has been applying a topical collagen ointment to both wounds with Adaptic. I don't think Dr. Marla Roe is planning to take her back to the OR. 08/19/16; I follow Mrs. Jeneen Rinks on a monthly basis along with Dr. Marla Roe of plastic surgery. 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 I been following her for a number of years. At one point I was able to get the right medial malleolus wound to heal and had the left medial malleolus down to about half its current size however and I had to send her to plastic surgery for an operative debridement. Since then things have been stable to slightly improve the area on the right is slightly better one in the left about the same although there is much less adherent surface than I'm used to with this patient. She is using some form of liquid collagen gel  that Dr. Marla Roe provided a Kerlix cover with the patient's own pressure stockings. She tells me that she has extreme pain in both ankles and along the lateral aspect of both feet. She has been unable to work for some period of time. She is telling me she is retiring at the beginning of April. She sees Dr. Doran Durand of orthopedics next week 09/22/16; patient has not seen Dr. Marla Roe since the last time she is here. I'm not really sure what she is using to the wounds other than bits and pieces of think she had left over including most recently Hydrofera Blue. She is using juxtalite stockings. She is having difficulty with her husband's recent illness "stroke". She is having to transport him to various doctors appointments. Dr. Marla Roe left her the option of a repeat debridement with ACEL however she has not been able to get the time to follow-up on this. She continues to have a fair amount of drainage out of these wounds with certainly precludes leaving dressings on all week 10/13/16; patient has not seen Dr. Marla Roe since  she was last in our clinic. I'm not really sure what she is doing with the wounds, we did try to get her [facility] and I think she is actually using this most of the time. Because of drainage she states she has to change this every second day although this is an improvement from what she used to do. She went to see Dr. Doran Durand who did not think she had a muscular issue with regards to her feet, he referred her to a neurologist and I think the appointment is sometime in June. I changed her back to Iodoflex which she has used in the past but not recently. 11/03/16; the patient has been using Iodoflex although she ran out of this. Still claims that there is a lot of drainage although the wound does not look like this. No surrounding erythema. She has not been back to see Dr. Marla Roe 11/24/16; the patient has been using Iodoflex again but she ran out of it 2 or 3 days ago.  There is no major change in the condition of either one of these wounds in fact they are larger and covered in a thick adherent surface slough/nonviable tissue especially on the left. She does not tolerate mechanical debridement in our clinic. Going back to see Dr. Marla Roe of plastic surgery for an operative debridement would seem reasonable. 12/15/16; the patient has not been back to see Dr. Marla Roe. She is been dealing with a series of illnesses and her husband which of monopolized her time. She is been using Sorbact which we largely supplied. She states the drainage is bad enough that it maximum she can go 2-3 days without changing the dressing 01/12/2017 -- the patient has not been back for about 4 weeks and has not seen Dr. Marla Roe not does she have any appointment pending. 01/23/17; patient has not seen Dr. Marla Roe even though I suggested this previously. She is using Santyl that was suggested last week by Dr. Con Memos this Cost her $16 through her insurance which is indeed surprising 02/12/17; continuing Santyl and the patient is changing this daily. A lot of drainage. She has not been back to see plastic surgery she is using an Ace wrap. Our intake nurse suggested wrap around stockings which would make a good reasonable alternative 02/26/17; patient is been using Santyl and changing this daily due to drainage. She has not been to see plastic surgery she uses in April Ace wrap to control the edema. She did obtain extremitease stockings but stated that the edema in her leg was to big for these 03/20/17; patient is using Santyl and Anasept. Surfaces looked better today the area on the right is actually measuring a little smaller. She has states she has a lot of pain in her feet and ankles and is asking for a consult to pain control which I'll try to help her with through our case manager. 04/10/17; the patient arrives with better-looking wound surfaces and is slightly smaller wound on  the left she is using a combination of Santyl and Anasept. She has an appointment or at least as started in the pain control center associated with [facility] 05/14/17; this is a patient who I followed for a prolonged period of time. 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 At one point I had this down to a much smaller wound on the left however these reopened and we've never been able to get these to heal. She has been using Santyl and Anasept gel  although 2 weeks ago she ran out of the Anasept gel. She has a stable appearance of the wound. She is going to the wound care clinic at [facility]. They wanted do a nerve block/spinal block although she tells me she is reluctant to go forward with that. 05/21/17; this is a patient I have followed for many years. 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 Chronic pain and deformity in her ankles as well. She is been to see plastic surgery as well as orthopedics. Using PolyMem AG most recently/Kerramax/ABDs and 2 layer compression. She has managed to keep this on and she is coming in for a nurse check to change the dressing on Tuesdays, we see her on Fridays 06/05/17; really quite a good looking surface and the area especially on the right medial has contracted in terms of dimensions. Well granulated healthy-looking tissue on both sides. Even with an open curet there is nothing that even feels abnormal here. This is as good as I've seen this in quite some time. We have been using PolyMem AG and bringing her in for a nurse check 06/12/17; really quite good surface on both of these wounds. The right medial has contracted a bit left is not. We've been using PolyMem and AG and she is coming in for a nurse visit 06/19/17; we have been using PolyMem AG and bringing her in for a nurse check. Dimensions of her wounds are not better but the surfaces looked better bilaterally. She complained of bleeding  last night and the left wound and increasing pain bilaterally. She states her wound pain is more neuropathic than just the wounds. There was some suggestion that this was radicular from her pain management doctor in talking to her it is really difficult to sort this out. 06/26/17; using PolyMem and AG and bringing her in for a nurse check as All of this and reasonably stable condition. Certainly not improved. The dimensions on the lateral part of the right leg look better but not really measuring better. The medial aspect on the left is about the same. 07/03/16; we have been using PolyMen AG and bringing her in for a nurse check to change the dressings as the wounds have drainage which precludes once weekly changing. We are using all secondary absorptive dressings.our intake nurse is brought up the idea of using a wound VAC/snap VAC on the wound to help with the drainage to see if this would result in some contraction. This is not a bad idea. The area on the right medial is actually looking smaller. Both wounds have a reasonable-looking surface. There is no evidence of cellulitis. The edema is well controlled 07/10/17; the patient was denied for a snap VAC by her insurance. The major issue with these wounds continues to be drainage. We are using wicked PolyMem AG and she is coming in for a nurse visit to change this. The wounds are stable to slightly improved. The surface looks vibrant and the area on the right certainly has shrunk in size but very slowly 07/17/17; the patient still has large wounds on her bilateral medial malleoli. Surface of both of these wounds looks better. The dimensions seem to come and go but no consistent improvement. There is no epithelialization. We do not have options for advanced treatment products due to insurance issues. They did not approve of the wound VAC to help control the drainage. More recently we've been using PolyMem and AG wicked to allow drainage through. We  have  been bringing her in for a nurse visit to change this. We do not have a lot of options for wound care products and the home again due to insurance issues 07/24/17; the patient's wound actually looks somewhat better today. No drainage measurements are smaller still healthy-looking surface. We used silver collagen under PolyMen started last week. We have been bringing her in for a dressing change 07/31/17; patient's wound surface continued to look better and I think there is visible change in the dimensions of the wound on the right. Rims of epithelialization. We have been using silver collagen under PolyMen and bringing her in for a dressing change. There appears to be less drainage although she is still in need of the dressing change 08/07/17. Patient's wound surface continues to look better on both sides and the area on the right is definitely smaller. We have been using silver collagen and PolyMen. She feels that the drainage has been it has been better. I asked her about her vascular status. She went to see Dr. Aleda Grana at [facility] and had some form of ablation. I don't have much detail on this. I haven't my notes from 2016 that she was not a candidate for any further ablation but I don't have any more information on this. We had referred her to vein and vascular I don't think she ever went. He does not have a history of PAD although I don't have any information on this either. We don't even have ABIs in our record 08/14/17; we've been using silver collagen and PolyMen cover. And putting the patient and compression. She we are bringing her in as a nurse visit to change this because ofarge amount of drainage. We didn't the ABIs in clinic today since they had been done in many moons 1.2 bilaterally. She has been to see vein and vascular however this was at [facility] and she had ablation although I really don't have any information on this all seemed biking get a report. She is also  been operatively debrided by plastic surgery and had a cell placed probably 8-12 months ago. This didn't have a major effect. We've been making some gains with current dressings 08/19/17-She is here in follow-up evaluation for bilateral medial malleoli ulcers. She continues to tolerate debridement very poorly. We will continue with recently changed topical treatment; if no significant improvement may consider switching to Iodosorb/Iodoflex. 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 She has been using silver collagen and PolyMem. I believe she has been used and tried on Iodoflex before. 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 These have been very refractory over time. We have been using silver collagen and PolyMen. She is coming in once a week for a doctor's and once a week for nurse visits. We are actually making some progress 09/18/17; the patient's wounds are smaller especially on the right medial. She arrives today to upset to consider even washing these off with Anasept which I think is been part of the reason this is been closing. We've been using collagen covered in PolyMen otherwise. It is noted that she has a small area of folliculitis on the right medial calf that. As we are wrapping her legs I'll give her a short course of doxycycline to make sure this doesn't amount  to anything. She is a long list of complaints today including imbalance, shortness of breath on exertion, inversion of her left ankle. With regards to the latter complaints she is been to see orthopedics and they offered her a tendon release surgery I believe but wanted her wounds to be closed first. I have recommended she go see her primary physician with regards to everything  else. 09/25/17; patient's wounds are about the same size. We have made some progress bilaterally although not in recent weeks. She will not allow me T wash these wounds with Anasept even if she is doing her cell. Wheeze we've been using collagen covered in PolyMen. Last week she had a small area of folliculitis this is now opened into a small wound. She completed 5 days of trimethoprim sulfamethoxazole 10/02/17; unfortunately the area on her left medial ankle is worse with a larger wound area towards the Achilles. The patient complains of a lot of pain. She will not allow debridement although visually I don't think there is anything to debridement in any case. We have been using silver collagen and PolyMen for several months now. Initially we are making some progress although I'm not really seeing that today. We will move back to [facility]. His admittedly this is a bit of a repeat however I'm hoping that his situation is different now. The patient tells me she had her leg on the left give out on her yesterday this is process some pain. 10/09/17; the patient is seen twice a week largely because of drainage issues coming out of the chronic medial bimalleolar wounds that are chronic. Last week the dimensions of the one on the left looks a little larger I changed her to [facility]. She comes in today with a history of terrible pain in the bilateral wound areas. She will not allow debridement. She will not even allow a tissue culture. There is no surrounding erythema no no evidence of cellulitis. We have been putting her Kerlix Coban man. She will not allow more aggressive compression as there was a suggestion to put her in 3 layer wraps. 10/16/17; large wounds on her bilateral medial malleoli. These are chronic. Not much change from last week. The surface looks have healthy but absolutely no epithelialization. A lot of pain little less so of drainage. She will not allow debridement or even washing  these off in the vigorous fashion with Anasept. 10/23/17; large wounds on her bilateral malleoli which are chronic. Some improvement in terms of size perhaps on the right since last time I saw these. She states that after we increased the 3 layer compression there was some bleeding, when she came in for a nurse visit she did not want 3 layer compression put back on about our nurse managed to convince her. She has known chronic venous visit issues and I'm hoping to get her to tolerate the 3 layer compression. using Hydrofera Blue 10/30/17; absolutely no change in the condition of either wound although we've had some improvement in dimensions on the right.. Attempted to put her in 3 layer compression she didn't tolerated she is back in 2 layer compression. We've been using Hydrofera Blue We looked over her past records. She had venous reflux studies in November 2016. There was no evidence of deep venous reflux on the right. Superficial vein did not show the greater saphenous vein at think this is been previously ablated the small saphenous vein was within normal limits. The left deep venous system showed no DVT  the vessels were positive for deep venous reflux in the posterior tibial veins at the ankle. The greater saphenous vein was surgically absent small saphenous vein was within normal limits. She went to vein and vascular at [facility]. I believe she had an ablation on the left greater saphenous vein. I'll update her reflux studies perhaps ever reviewed by vein and vascular. We've made absolutely no progress in these wounds. Will also try to read and TheraSkins through her insurance 11/06/17; W the patient apparently has a 2 week follow-up with vein and vascular I like him to review the whole issue with regards to her previous vascular workup by Dr. Aleda Grana. We've really made no progress on these wounds in many months. She arrives today with less viable looking surface on the left medial  ankle wound. This was apparently looking about the same on Tuesday when she was here for nurse visit. 11/13/17; deep tissue culture I did last time of the left lower leg showed multiple organisms without any predominating. In particular no Staphylococcus or group A strep were isolated. We sent her for venous reflux studies. She's had a previous left greater saphenous vein stripping and I think sclerotherapy of the right greater saphenous vein. She didn't really look at the lesser saphenous vein this both wounds are on the medial aspect. She has reflux in the common femoral vein and popliteal vein and an accessory vein on the right and the common femoral vein and popliteal vein on the left. I'm going to have her go to see vein and vascular just the look over things and see if anything else beside aggressive compression is indicated here. We have not been able to make any progress on these wounds in spite of the fact that the surface of the wounds is never look too bad. 11/20/17; no major change in the condition of the wounds. Patient reports a large amount of drainage. She has a lot of complaints of pain although enlisting her today I wonder if some of this at least his neuropathic rather than secondary to her wounds. She has an appointment with vein and vascular on 12/30/17. The refractory nature of these wounds in my mind at least need vein and vascular to look over the wounds the recent reflux studies we did and her history to see if anything further can be done here. I also note her gait is deteriorated quite a bit. 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 She has a bilateral Trendelenburg gait. I wonder if this is neuropathic or perhaps multilevel radicular. 11/27/17; her wounds actually looks slightly better. Healthy-looking granulation tissue a scant amount of epithelialization. [place] healthcare will not pay for [employer]. They will play for tri layer Oasis and Dermagraft. This is  not a diabetic ulcer. We'll try for the tri layer Oasis. She still complains of some drainage. She has a vein and vascular appointment on 12/30/17 12/04/17; the wounds visually look quite good. Healthy-looking granulation with some degree of epithelialization. We are still waiting for response to our request for trial to try layer Oasis. Her appointment with vascular to review venous and arterial issues isn't sold the end of July 7/31. Not allow debridement or even vigorous cleansing of the wound surface. 12/18/17; slightly smaller especially on the right. 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 We've been using Hydrofera Blue. We still are looking into triple layer Oasis through her insurance 01/08/18 on evaluation today patient's wound actually appears to be showing  signs of good improvement at this point in time. She has been tolerating the dressing changes without complication. Fortunately there does not appear to be any evidence of infection at this point in time. We have been utilizing silver nitrate which does seem to be of benefit for her which is also good news. Overall I'm very happy with how things seem to be both regards appearance as well as measurement. Patient did see Dr. Bridgett Larsson for evaluation on 12/30/17. In his assessment he felt that stripping would not likely add much more than chronic compression to the patient's healing process. His recommendation was to follow-up in three months with Dr. Doren Custard if she hasn't healed in order to consider referral back to you and see vascular where she previously was in a trial and was able to get her wound to heal. I'll be see what she feels she when you staying compression and he reiterated this as well. 01/13/18 on evaluation today patient appears to actually be doing very well in regard to her bilateral medial malleolus ulcers. She seems to have tolerated the chemical cauterization with silver nitrate last week she did  have some pain through that evening but fortunately states that I'll be see since it seems to be doing better she is overall pleased with the progress. 01/21/18; really quite a remarkable improvement since I've last seen these wounds. We started using silver nitrate specially on the islands of hyper granulation which for some reason her around the wound circumference. This is really done quite nicely. Primary dressing Hydrofera Blue under 4 layer compression. She seems to be able to hold out without a nurse rewrap. Follow-up in 1 week 01/28/18; we've continued the hydrofera blue but continued with chemical cauterization to the wound area that we started about a month ago for irregular hyper granulation. She is made almost stunning improvement in the overall wound dimensions. I was not really expecting this degree of improvement in these chronic wounds 02/05/18; we continue with Hydrofera Bluebut of also continued the aggressive chemical cauterization with silver nitrate. We made nice progress with the right greater than left wound. 02/12/18. We continued with Hydrofera Blue after aggressive chemical cauterization with silver nitrate. We appear to be making nice progress with both wound areas 02/19/2018; we continue with [facility] after washing the wounds vigorously with Anasept spray and chemical cauterization with silver nitrate. We are making excellent progress. The area on the right's just about closed 02/26/2018. The area on the left medial ankle had too much necrotic debris today. I used a #5 curette we are able to get most of the soft. I continued with the silver nitrate to the much smaller wound on the right medial ankle she had a new area on her right lower pretibial area which she says was due to a role in her compression 03/05/2018; both wound areas look healthy. Not much change in dimensions from last week. I continue to use silver nitrate and Hydrofera Blue. The patient saw Dr. Doren Custard of  vein and vascular. He felt she had venous stasis ulcers. He felt based on her previous arteriogram she should have adequate circulation for healing. Also she has deep venous reflux but really no significant correctable superficial venous reflux at this time. He felt we should continue with conservative management including leg elevation and compression 04/02/2018; since we last saw this woman about a month ago she had a fall apparently suffered a pelvic fracture. I did not look up the x-ray. Nevertheless because of  pain she literally was bedbound for 2 weeks and had home health coming out to change the dressing. Somewhat predictably this is resulted in considerable improvement in both wound areas. The right is just about closed on the medial malleolus and the left is about half the size. 04/16/2018; both her wounds continue to go down in size. Using Hydrofera Blue. 05/07/18; both her wounds appeared to be improving especially on the right where it is almost closed. We are using Hydrofera Blue 05/14/2018; slightly worse this week with larger wounds. Surface on the left medial not quite as good. We have been using Hydrofera Blue 05/21/18; again the wounds are slightly larger. Left medial malleolus slightly larger with eschar around the circumference. We have been using Hydrofera Blue undergoing a wraps for a prolonged period of time. This got a lot better when she was more recumbent due to a fall and a back injury. I change the primary dressing the silver alginate today. She did not tolerate a 4 layer compression previously although I may need to bring this up with her next time 05/28/2018; area on the left medial malleolus again is slightly larger with more drainage. Area on the right is roughly unchanged. She has a small area of folliculitis on the right medial just on the lower calf. This does not look ominous. 06/03/2018 left medial malleolus slightly smaller in a better looking surface. We used  silver nitrate on this last time with silver alginate. The area on the right appears slightly smaller 1/10; left medial malleolus slightly smaller. Small open area on the right. We used silver nitrate and silver alginate as of 2 weeks ago. We continue with the wound and compression. These got a lot better when she was off her feet 1/17; right medial malleolus wound is smaller. The left may be slightly smaller. Both surfaces look somewhat better. 1/24; both wounds are slightly smaller. Using silver alginate under Unna boots 1/31; both wounds appear smaller in fact the area on the right medial is just about closed. Surface eschar. We have been using silver alginate under Unna boots. The patient is less active now spends let much less time on her feet and I think this is contributed to the general improvement in the wound condition 2/7; both wounds appear smaller. I was hopeful the right medial would be closed however there there is still the same small open area. Slight amount of surface eschar on the left the dimensions are smaller there is eschar but the wound edges appear to be free. We have been using silver alginate under Unna boot's 2/14; both wounds once again measure smaller. Circumferential eschar on the left medial. We have been using silver alginate under Unna boots with gradual improvement 2/21; the area on the right medial malleolus has healed. The area on the left is smaller. We have been using silver alginate and Unna boots. We can discharge wrapping the right leg she has 20/30 stockings at home she will need to protect the scar tissue in this area 2/28; the area on the right medial malleolus remains closed the patient has a compression stocking. The area on the left is smaller. We have been using silver alginate and Unna boots. 3/6 the area on the right medial ankle remains closed. Good edema control noted she is using her own compression stocking. The area on the left medial ankle  is smaller. We have been managing this with silver alginate and Unna boots which we will continue today. 3/13; the area  on the right medial ankle remains closed and I'm declaring it healed today. When necessary the left is about the same still a healthy-looking surface but no major change and wound area. No evidence of infection and using silver alginate under unna and generally making considerable improvement 3/27 the area on the right medial ankle remains closed the area on the left is about the same as last week. Certainly not any worse we have been using silver alginate under an Unna boot 4/3; the area on the right medial ankle remains closed per the patient. We did not look at this wound. The wound on the left medial ankle is about the same surface looks healthy we have been using silver alginate under an Unna boot 4/10; area on the right medial ankle remains closed per the patient. We did not look at this wound. The wound on the left medial ankle is slightly larger. The patient complains that the [facility] caused burning pain all week. She also told us that she was a lot more active this week. Changed her back to silver alginate 4/17; right medial ankle still closed per the patient. Left medial ankle is slightly larger. Using silver alginate. She did not tolerate Hydrofera Blue on this area 4/24; right medial ankle remains closed we have not look at this. The left medial ankle continues to get larger today by about a centimeter. We have been using silver alginate under Unna boots. She complains about 4 layer compression as an alternative. She has been up on her feet working on her garden 5/8; right medial ankle remains closed we did not look at this. The left medial ankle has increased in size about 100%. We have been using silver alginate under Unna boots. She noted increased pain this week and was not surprised that the wound is deteriorated 5/15; no major change in SA however much  less erythema ( one week of doxy ocellulitis). 5/22-63 year old female returns at 1 week to the clinic for left medial ankle wound for which we have been using silver alginate under 3 layer compression She was placed on DOXY at last visit - the wound is wider at this visit. She is in 3 layer compression 5/29; change to [facility] last week. I had given her empiric doxycycline 2 weeks ago for a week. She is in 3 layer compression. She complains of a lot of pain and drainage on presentation today. 6/5; using Hydrofera Blue. I gave her doxycycline recently empirically for erythema and pain around the wound. Believe her cultures showed enterococcus which not would not have been well covered by doxycycline nevertheless the wound looks better and I don't feel specifically that the enterococcus needs to be covered. She has a new what looks like a wrap injury on her lateral left ankle. 6/12; she is using Hydrofera Blue. She has a new area on the left anterior lower tibial area. This was a wrap injury last week. 6/19; the patient is using Hydrofera Blue. She arrived with marked inflammation and erythema around the wound and tenderness. 12/01/18 on evaluation today patient appears to be doing a little bit better based on what I'm hearing from the standpoint of lassos evaluation to this as far as the overall appearance of the wound is concerned. Then sometime substandard she typically sees Dr. Dellia Nims. Nonetheless overall very pleased with the progress that she's made up to this point. No fevers, chills, nausea, or vomiting noted at this time. 7/10; some improvement in the surface area.  Aggressively debrided last week apparently. I went ahead with the debridement today although the patient does not tolerate this very well. We have been using Iodoflex. Still a fair amount of drainage 7/17; slightly smaller. Using Iodoflex. 7/24; no change from last week in terms of surface area. We have been using  Iodoflex. Surface looks and continues to look somewhat better 7/31; surface area slightly smaller better looking surface. We have been using Iodoflex. This is under Unna boot compression 8/7-Patient presents at 1 week with Unna boot and Iodoflex, wound appears better 8/14-Patient presents at 1 week with Iodoflex, we use the Unna boot, wound appears to be stable better.Patient is getting Botox treatment for the inversion of the foot for tendon release, Next week 8/21; we are using Iodoflex. Unna boot. The wound is stable in terms of surface area. Under illumination there is some areas of the wound that appear to be either epithelialized or perhaps this is adherent slough at this point I was not really clear. It did not wipe off and I was reluctant to debride this today. 8/28; we are using Iodoflex in an Unna boot. Seems to be making good improvement. 9/4; using Iodoflex and wound is slightly smaller. 9/18; we are using Iodoflex with topical silver nitrate when she is here. The wound continues to be smaller 10/2; patient missed her appointment last week due to GI issues. She left and Iodoflex based dressing on for 2 weeks. Wound is about the same size about the size of a dime on the left medial lower leg Objective Constitutional Wide pulse pressure however the patient looks well. Pulse regular and within target range for patient.Marland Kitchen Respirations regular, non-labored and within target range.. Temperature is normal and within the target range for the patient.Marland Kitchen Appears in no distress. Vitals Time Taken: 10:20 AM, Height: 68 in, Weight: 127 lbs, BMI: 19.3, Temperature: 97.7 F, Pulse: 52 bpm, Respiratory Rate: 18 breaths/min, Blood Pressure: 113/41 mmHg. Eyes Conjunctivae clear. No discharge.no icterus. Respiratory work of breathing is normal. Cardiovascular Edema control is good. Psychiatric appears at normal baseline. General Notes: Wound exam; left medial malleolus. Generally healthy  looking wound bed. There was no hyper granulation no drainage no evidence of surrounding infection Integumentary (Hair, Skin) No evidence of surrounding erythema. Wound #3 status is Open. Original cause of wound was Gradually Appeared. The wound is located on the Left,Medial Malleolus. The wound measures 1.4cm length x 1.5cm width x 0.1cm depth; 1.649cm^2 area and 0.165cm^3 volume. There is Fat Layer (Subcutaneous Tissue) Exposed exposed. There is no tunneling or undermining noted. There is a medium amount of serosanguineous drainage noted. The wound margin is flat and intact. There is large (67-100%) pink granulation within the wound bed. There is a small (1-33%) amount of necrotic tissue within the wound bed including Adherent Slough. 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 There was a Haematologist Compression Therapy Procedure by Deon Pilling, RN. Post procedure Diagnosis Wound #3: Same as Pre-Procedure Plan Follow-up Appointments: Return Appointment in 1 week. Dressing Change Frequency: Wound #3 Left,Medial Malleolus: Do not change entire dressing for one week. Skin Barriers/Peri-Wound Care: Wound #3 Left,Medial Malleolus: TCA Cream or Ointment - to leg Wound Cleansing: Wound #3 Left,Medial Malleolus: Clean wound with Wound Cleanser - clean with anasept with  dressing changes May shower with protection. Primary Wound Dressing: Wound #3 Left,Medial Malleolus: Iodoflex Secondary Dressing: Wound #3 Left,Medial Malleolus: Dry Gauze Kerramax Edema Control: Unna Boot to Left Lower Extremity - no kerlix layer Avoid standing for long periods of time Elevate legs to the level of the heart or above for 30 minutes daily and/or when  sitting, a frequency of: Support Garment 20-30 mm/Hg pressure to: - compression stocking right leg daily 1. I continue to the Iodoflex for this week. 2. Still under an Unna boot wrap 3. Not much change from 2 weeks ago but is certainly not worse. Could consider Hydrofera Blue down Electronic Signature(s) Signed: 03/04/2019 6:19:43 PM By: Linton Ham MD Entered By: Linton Ham on 03/04/2019 11:23:25 -------------------------------------------------------------------------------- SuperBill Details Patient Name: Date of Service: [name] 03/04/2019 Medical Record [id number] Patient Account Number: [id number] Date of Birth/Sex: Treating RN: 06/12/1955 (63 y.o. 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

## 2019-03-07 NOTE — Progress Notes (Signed)
[name], [name] ([id number]) Visit Report for 03/04/2019 Arrival Information Details Patient Name: Date of Service: [name], [name] 03/04/2019 10:15 AM Medical Record [id number] Patient Account Number: [id number] Date of Birth/Sex: Treating RN: [birth year] (63 y.o. [name] Primary Care Janel Beane: [name] Other Clinician: [name] Referring Wyland Rastetter: Treating Rayne Loiseau/Extender:Robson, Tawny Asal in Treatment: 325 Visit Information History Since Last Visit Cane Added or deleted any medications: No Patient Arrived: 10:18 Any new allergies or adverse reactions: No Arrival Time: alone Had a fall or experienced change in No Accompanied By: None activities of daily living that may affect Transfer Assistance: risk of falls: Patient Identification Verified: Yes Signs or symptoms of abuse/neglect since last No Secondary Verification Process Completed: Yes visito Patient Requires Transmission-Based No Hospitalized since last visit: No Precautions: Implantable device outside of the clinic excluding No Patient Has Alerts: No cellular tissue based products placed in the center since last visit: Has Dressing in Place as Prescribed: Yes Has Compression in Place as Prescribed: Yes Pain Present Now: Yes Electronic Signature(s) Signed: 03/07/2019 6:17:12 PM By: Levan Hurst RN, BSN Entered By: Levan Hurst on 03/04/2019 10:19:58 -------------------------------------------------------------------------------- Compression Therapy Details Patient Name: Date of Service: [name] 03/04/2019 10:15 AM Medical Record [id number] Patient Account Number: [id number] Date of Birth/Sex: Treating RN: August 12, 1955 (63 y.o. Elam [name] Primary Care Tunis Gentle: [name] Other Clinician: [name] Referring Brantley Naser: Treating Kiam Bransfield/Extender:Robson, Tawny Asal in Treatment: 325 Compression Therapy  Performed for Wound Wound #3 Left,Medial Malleolus Assessment: Performed By: Clinician Deon Pilling, RN Compression Type: Rolena Infante Post Procedure Diagnosis Same as Pre-procedure Electronic Signature(s) Signed: 03/04/2019 7:05:19 PM By: Baruch Gouty RN, BSN Entered By: Baruch Gouty on 03/04/2019 11:06:27 -------------------------------------------------------------------------------- Encounter Discharge Information Details Patient Name: Date of Service: [name] 03/04/2019 10:15 AM Medical Record [id number] Patient Account Number: [id number] Date of Birth/Sex: Treating RN: [birth year] (63 y.o. [name] Primary Care Chelsi Warr: [name] Other Clinician: [name] Referring Salam Chesterfield: Treating Tanmay Halteman/Extender:Robson, Tawny Asal in Treatment: 325 Encounter Discharge Information Items Discharge Condition: Stable Ambulatory Status: Cane Discharge Destination: Home Transportation: Private Auto Accompanied By: self Schedule Follow-up Appointment: Yes Clinical Summary of Care: Electronic Signature(s) Signed: 03/04/2019 6:13:58 PM By: Deon Pilling Entered By: Deon Pilling on 03/04/2019 11:38:59 -------------------------------------------------------------------------------- Lower Extremity Assessment Details Patient Name: Date of Service: [name], [name] 03/04/2019 10:15 AM Medical Record [id number] Patient Account Number: [id number] Date of Birth/Sex: Treating RN: 06-07-1955 (63 y.o. [name] Primary Care Tanisia Yokley: [name] Other Clinician: [name] Referring Ashlan Dignan: Treating Hanley Woerner/Extender:Robson, Velva Harman, Essie Christine in Treatment: 325 Edema Assessment Assessed: [Left: No] [Right: No] Edema: [Left: N] [Right: o] Calf Left: Right: Point of Measurement: 33 cm From Medial Instep 28 cm cm Ankle Left: Right: Point of Measurement: 12 cm From Medial Instep 22 cm cm Vascular  Assessment Pulses: Dorsalis Pedis Palpable: [Left:Yes] Electronic Signature(s) Signed: 03/07/2019 6:17:12 PM By: Levan Hurst RN, BSN Entered By: Levan Hurst on 03/04/2019 10:21:05 -------------------------------------------------------------------------------- Multi Wound Chart Details Patient Name: Date of Service: [name] 03/04/2019 10:15 AM Medical Record [id number] Patient Account Number: [id number] Date of Birth/Sex: Treating RN: 1955/07/25 (63 y.o. Elam [name] Primary Care Melani Brisbane: [name] Other Clinician: [name] Referring Irfan Veal: Treating Taccara Bushnell/Extender:Robson, Tawny Asal in Treatment: 325 Vital Signs Height(in): 68 Pulse(bpm): 52 Weight(lbs): 127 Blood Pressure(mmHg): 113/41 Body Mass Index(BMI): 19 Temperature(F): 97.7 Respiratory 18 Rate(breaths/min): Photos: [3:No Photos] [N/A:N/A] Wound Location: [3:Left Malleolus - Medial] [N/A:N/A] Wounding Event: [3:Gradually Appeared] [  N/A:N/A] Primary Etiology: [3:Venous Leg Ulcer] [N/A:N/A] Comorbid History: [3:Congestive Heart Failure, Peripheral Vascular Disease, Congestive Heart Failure, End Stage Renal Disease, Tobacco Use, Chronic Obstructive Pulmonary Disease (COPD), Type 1 Diabetes] [N/A:N/A] Date Acquired: [3:11/15/2012] [N/A:N/A] Weeks of Treatment: [3:325] [N/A:N/A] Wound Status: [3:Open] [N/A:N/A] Measurements L x W x D 1.4x1.5x0.1 [N/A:N/A] (cm) Area (cm) : [3:1.649] [N/A:N/A] Volume (cm) : [3:0.165] [N/A:N/A] % Reduction in Area: [3:47.50%] [N/A:N/A] % Reduction in Volume: [3:73.70%] [N/A:N/A] Classification: [3:Full Thickness Without Exposed Support Structures] [N/A:N/A] Exudate Amount: [3:Medium] [N/A:N/A] Exudate Type: [3:Serosanguineous] [N/A:N/A] Exudate Color: [3:red, brown] [N/A:N/A] Wound Margin: [3:Flat and Intact] [N/A:N/A] Granulation Amount: [3:Large (67-100%)] [N/A:N/A] Granulation Quality: [3:Pink] [N/A:N/A] Necrotic  Amount: [3:Small (1-33%)] [N/A:N/A] Exposed Structures: [3:Fat Layer (Subcutaneous N/A Tissue) Exposed: Yes Fascia: No Tendon: No Muscle: No Joint: No Bone: No] Epithelialization: [3:Small (1-33%) Compression Therapy] [N/A:N/A N/A] Treatment Notes Electronic Signature(s) Signed: 03/04/2019 6:19:43 PM By: Linton Ham MD Signed: 03/04/2019 7:05:19 PM By: Baruch Gouty RN, BSN Entered By: Linton Ham on 03/04/2019 11:18:53 -------------------------------------------------------------------------------- Multi-Disciplinary Care Plan Details Patient Name: Date of Service: MARGE, VANDERMEULEN 03/04/2019 10:15 AM Medical Record [id number] Patient Account Number: [id number] Date of Birth/Sex: Treating RN: Sep 13, 1955 (63 y.o. Elam [name] Primary Care Aaryan Essman: [name] Other Clinician: [name] Referring Joury Allcorn: Treating Maven Rosander/Extender:Robson, Tawny Asal in Treatment: 325 Active Inactive Venous Leg Ulcer Nursing Diagnoses: Actual venous Insuffiency (use after diagnosis is confirmed) Goals: Patient will maintain optimal edema control Date Initiated: 09/04/2017 Target Resolution Date: 03/18/2019 Goal Status: Active Patient/caregiver will verbalize understanding of disease process and disease management Date Initiated: 08/03/2014 Date Inactivated: 08/28/2017 Target Resolution Date: 08/29/2017 Goal Status: Met Interventions: Compression as ordered Treatment Activities: Therapeutic compression applied : 09/21/2015 Notes: Wound/Skin Impairment Nursing Diagnoses: Impaired tissue integrity Knowledge deficit related to ulceration/compromised skin integrity Goals: Patient will have a decrease in wound volume by X% from date: (specify in notes) Date Initiated: 04/05/2015 Date Inactivated: 02/12/2017 Target Resolution Date: 09/01/2016 Unmet Reason: chronic, Goal Status: Unmet unable to tolerate stronger compression Patient/caregiver will  verbalize understanding of skin care regimen Date Initiated: 04/05/2015 Target Resolution Date: 03/18/2019 Goal Status: Active Interventions: Assess patient/caregiver ability to obtain necessary supplies Assess patient/caregiver ability to perform ulcer/skin care regimen upon admission and as needed Assess ulceration(s) every visit Provide education on ulcer and skin care Notes: Electronic Signature(s) Signed: 03/04/2019 7:05:19 PM By: Baruch Gouty RN, BSN Entered By: Baruch Gouty on 03/04/2019 10:24:38 -------------------------------------------------------------------------------- Pain Assessment Details Patient Name: Date of Service: [name] 03/04/2019 10:15 AM Medical Record [id number] Patient Account Number: [id number] Date of Birth/Sex: Treating RN: 07-01-1955 (63 y.o. [name] Primary Care Trenika Hudson: [name] Other Clinician: [name] Referring Kacia Halley: Treating Windsor Zirkelbach/Extender:Robson, Tawny Asal in Treatment: 325 Active Problems Location of Pain Severity and Description of Pain Patient Has Paino Yes Site Locations Pain Location: Pain in Ulcers With Dressing Change: Yes Duration of the Pain. Constant / Intermittento Intermittent Rate the pain. Current Pain Level: 5 Character of Pain Describe the Pain: Throbbing Pain Management and Medication Current Pain Management: Medication: No Cold Application: No Rest: No Massage: No Activity: No T.E.N.S.: No Heat Application: No Leg drop or elevation: No Is the Current Pain Management Adequate: Adequate How does your wound impact your activities of daily livingo Sleep: No Bathing: No Appetite: No Relationship With Others: No Bladder Continence: No Emotions: No Bowel Continence: No Work: No Toileting: No Drive: No Dressing: No Hobbies: No Engineer, maintenance) Signed: 03/07/2019 6:17:12 PM By: Levan Hurst RN, BSN Entered  By: Levan Hurst on  03/04/2019 10:20:57 -------------------------------------------------------------------------------- Patient/Caregiver Education Details Patient Name: Date of Service: SHEILA, OCASIO 10/2/2020andnbsp10:15 AM Medical Record [id number] Patient Account Number: [id number] Date of Birth/Gender: Treating RN: 1955-06-20 (63 y.o. Elam [name] Primary Care Physician: [name] Other Clinician: [name] Referring Physician: Treating Physician/Extender:Robson, Tawny Asal in Treatment: [place] Education Assessment Education Provided To: Patient Education Topics Provided Venous: Methods: Explain/Verbal Responses: Reinforcements needed, State content correctly Wound/Skin Impairment: Methods: Explain/Verbal Responses: Reinforcements needed, State content correctly Electronic Signature(s) Signed: 03/04/2019 7:05:19 PM By: Baruch Gouty RN, BSN Entered By: Baruch Gouty on 03/04/2019 10:25:10 -------------------------------------------------------------------------------- Wound Assessment Details Patient Name: Date of Service: MARYJEAN, CORPENING 03/04/2019 10:15 AM Medical Record [id number] Patient Account Number: [id number] Date of Birth/Sex: Treating RN: [birth year] (63 y.o. [name] Primary Care Elain Wixon: [name] Other Clinician: [name] Referring Prithvi Kooi: Treating Donoven Pett/Extender:Robson, Velva Harman, Essie Christine in Treatment: 325 Wound Status Wound Number: 3 Primary Venous Leg Ulcer Etiology: Wound Location: Left Malleolus - Medial Wound Open Wounding Event: Gradually Appeared Status: Date Acquired: 11/15/2012 Comorbid Congestive Heart Failure, Peripheral Vascular Weeks Of Treatment: 325 History: Disease, Congestive Heart Failure, End Stage Clustered Wound: No Renal Disease, Tobacco Use, Chronic Obstructive Pulmonary Disease (COPD), Type 1 Diabetes Photos Wound Measurements Length: (cm) 1.4 %  Reductio Width: (cm) 1.5 % Reductio Depth: (cm) 0.1 Epithelial Area: (cm) 1.649 Tunneling Volume: (cm) 0.165 Undermini Wound Description Full Thickness Without Exposed Support Foul O Classification: Structures Slough Wound Flat and Intact Margin: Exudate Medium Amount: Exudate Serosanguineous Type: Exudate red, brown Color: Wound Bed Granulation Amount: Large (67-100%) Granulation Quality: Pink Fascia Necrotic Amount: Small (1-33%) Fat La Necrotic Quality: Adherent Slough Tendon Muscle Joint Bone E dor After Cleansing: No /Fibrino Yes Exposed Structure Exposed: No yer (Subcutaneous Tissue) Exposed: Yes Exposed: No Exposed: No Exposed: No xposed: No n in Area: 47.5% n in Volume: 73.7% ization: Small (1-33%) : No ng: No Treatment Notes Wound #3 (Left, Medial Malleolus) 1. Cleanse With Wound Cleanser Soap and water 2. Periwound Care Moisturizing lotion TCA Ointment 3. Primary Dressing Applied Iodoflex 4. Secondary Dressing Dry Gauze Kerramax/Xtrasorb 6. Support Layer Production assistant, radio Notes no kerlix per patient request. Engineer, maintenance) Signed: 03/07/2019 4:14:10 PM By: Mikeal Hawthorne EMT/HBOT Signed: 03/07/2019 6:17:12 PM By: Levan Hurst RN, BSN Entered By: Mikeal Hawthorne on 03/07/2019 09:16:17 -------------------------------------------------------------------------------- Carrier Details Patient Name: Date of Service: [name] 03/04/2019 10:15 AM Medical Record [id number] Patient Account Number: [id number] Date of Birth/Sex: Treating RN: 1955-06-03 (63 y.o. [name] Primary Care Tyronda Vizcarrondo: [name] Other Clinician: [name] Referring Kirstina Leinweber: Treating Lennox Leikam/Extender:Robson, Tawny Asal in Treatment: 325 Vital Signs Time Taken: 10:20 Temperature (F): 97.7 Height (in): 68 Pulse (bpm): 52 Weight (lbs): 127 Respiratory Rate (breaths/min): 18 Body Mass Index (BMI):  19.3 Blood Pressure (mmHg): 113/41 Reference Range: 80 - 120 mg / dl Electronic Signature(s) Signed: 03/07/2019 6:17:12 PM By: Levan Hurst RN, BSN Entered By: Levan Hurst on 03/04/2019 10:20:39

## 2019-03-08 NOTE — Progress Notes (Signed)
JENAYA, SAAR ([id number]) Visit Report for 01/14/2019 Arrival Information Details Patient Name: Date of Service: [name], [name] 01/14/2019 10:00 AM Medical Record [id number] Patient Account Number: [id number] Date of Birth/Sex: Treating RN: 1955/09/12 (63 y.o. [name] Primary Care Arlo Buffone: [name] Other Clinician: Referring Louden Houseworth: Treating Arlyn Buerkle/Extender:Madduri, Cherly Beach in Treatment: 318 Visit Information History Since Last Visit Cane All ordered tests and consults were completed: No Patient Arrived: 10:38 Added or deleted any medications: No Arrival Time: Any new allergies or adverse reactions: No Accompanied By: self None Had a fall or experienced change in No Transfer Assistance: activities of daily living that may affect Patient Identification Verified: Yes risk of falls: Secondary Verification Process Completed: Yes Signs or symptoms of abuse/neglect since last No Patient Requires Transmission-Based No visito Precautions: Hospitalized since last visit: No Patient Has Alerts: No Implantable device outside of the clinic excluding No cellular tissue based products placed in the center since last visit: Has Dressing in Place as Prescribed: Yes Has Compression in Place as Prescribed: Yes Pain Present Now: Yes Electronic Signature(s) Signed: 03/08/2019 6:03:31 PM By: [name] RN Entered By: [name] on 01/14/2019 10:38:48 -------------------------------------------------------------------------------- Compression Therapy Details Patient Name: Date of Service: [name], [name] 01/14/2019 10:00 AM Medical Record [id number] Patient Account Number: [id number] Date of Birth/Sex: Treating RN: 06/20/1955 (63 y.o. [name] Leopold Smyers: [name] Other Clinician: Referring Kaiden Pech: Treating Beryl Balz/Extender:Madduri, Cherly Beach in Treatment: 318 Compression Therapy  Performed for Wound Wound #3 Left,Medial Malleolus Assessment: Performed By: Clinician Deon Pilling, RN Compression Type: Rolena Infante Post Procedure Diagnosis Same as Pre-procedure Electronic Signature(s) Signed: 01/14/2019 5:20:10 PM By: Baruch Gouty RN, BSN Entered By: Baruch Gouty on 01/14/2019 10:55:25 -------------------------------------------------------------------------------- Encounter Discharge Information Details Patient Name: Date of Service: [name], [name] 01/14/2019 10:00 AM Medical Record [id number] Patient Account Number: [id number] Date of Birth/Sex: Treating RN: 1956/04/28 (63 y.o. [name] Primary Care Landyn Lorincz: [name] Other Clinician: Referring Shyane Fossum: Treating Elridge Stemm/Extender:Madduri, Cherly Beach in Treatment: 318 Encounter Discharge Information Items Discharge Condition: Stable Ambulatory Status: Cane Discharge Destination: Home Transportation: Private Auto Accompanied By: self Schedule Follow-up Appointment: Yes Clinical Summary of Care: Electronic Signature(s) Signed: 01/14/2019 5:35:34 PM By: Deon Pilling Entered By: Deon Pilling on 01/14/2019 12:59:48 -------------------------------------------------------------------------------- Lower Extremity Assessment Details Patient Name: Date of Service: [name], [name] 01/14/2019 10:00 AM Medical Record [id number] Patient Account Number: [id number] Date of Birth/Sex: Treating RN: [birth year] (63 y.o. [name] Primary Care Ardith Lewman: [name] Other Clinician: Referring Aleiah Mohammed: Treating Desma Wilkowski/Extender:Madduri, Cherly Beach in Treatment: 318 Edema Assessment Assessed: [Left: No] [Right: No] Edema: [Left: N] [Right: o] Calf Left: Right: Point of Measurement: 33 cm From Medial Instep 29 cm cm Ankle Left: Right: Point of Measurement: 12 cm From Medial Instep 20.5 cm cm Electronic Signature(s) Signed: 03/08/2019 6:03:31 PM By:  [name] RN Entered By: [name] on 01/14/2019 10:40:38 -------------------------------------------------------------------------------- [place] Details Patient Name: Date of Service: [name], [name] 01/14/2019 10:00 AM Medical Record [id number] Patient Account Number: [id number] Date of Birth/Sex: Treating RN: 1955/06/15 (63 y.o. [name] Carletta Feasel: [name] Other Clinician: Referring Magdalena Skilton: Treating Londyn Hotard/Extender:Madduri, Cherly Beach in Treatment: 318 Active Inactive Venous Leg Ulcer Nursing Diagnoses: Actual venous Insuffiency (use after diagnosis is confirmed) Goals: Patient will maintain optimal edema control Date Initiated: 09/04/2017 Target Resolution Date: 02/18/2019 Goal Status: Active Patient/caregiver will verbalize understanding of disease process and disease management Date Initiated: 08/03/2014 Date Inactivated: 08/28/2017 Target Resolution Date:  08/29/2017 Goal Status: Met Interventions: Compression as ordered Treatment Activities: Therapeutic compression applied : 09/21/2015 Notes: Wound/Skin Impairment Nursing Diagnoses: Impaired tissue integrity Knowledge deficit related to ulceration/compromised skin integrity Goals: Patient will have a decrease in wound volume by X% from date: (specify in notes) Date Initiated: 04/05/2015 Date Inactivated: 02/12/2017 Target Resolution Date: 09/01/2016 Unmet Reason: chronic, Goal Status: Unmet unable to tolerate stronger compression Patient/caregiver will verbalize understanding of skin care regimen Date Initiated: 04/05/2015 Target Resolution Date: 02/18/2019 Goal Status: Active Interventions: Assess patient/caregiver ability to obtain necessary supplies Assess patient/caregiver ability to perform ulcer/skin care regimen upon admission and as needed Assess ulceration(s) every visit Provide education on ulcer and skin care Notes: Electronic  Signature(s) Signed: 01/14/2019 5:20:10 PM By: Baruch Gouty RN, BSN Entered By: Baruch Gouty on 01/14/2019 10:57:33 -------------------------------------------------------------------------------- Pain Assessment Details Patient Name: Date of Service: [name], [name] 01/14/2019 10:00 AM Medical Record [id number] Patient Account Number: [id number] Date of Birth/Sex: Treating RN: February 11, 1956 (63 y.o. [name] Primary Care Aulani Shipton: [name] Other Clinician: Referring Elizabth Palka: Treating Shayann Garbutt/Extender:Madduri, Cherly Beach in Treatment: 318 Active Problems Location of Pain Severity and Description of Pain Patient Has Paino Yes Site Locations With Dressing Change: Yes Duration of the Pain. Constant / Intermittento Constant Rate the pain. Current Pain Level: 6 Worst Pain Level: 9 Least Pain Level: 6 Tolerable Pain Level: 0 Character of Pain Describe the Pain: Aching, Burning, Sharp, Stabbing Pain Management and Medication Current Pain Management: Medication: Yes Cold Application: No Rest: Yes Massage: No Activity: No T.E.N.S.: No Heat Application: No Leg drop or elevation: No Is the Current Pain Management Adequate: Inadequate How does your wound impact your activities of daily livingo Sleep: No Bathing: No Appetite: No Relationship With Others: No Bladder Continence: No Emotions: No Bowel Continence: No Work: No Toileting: No Drive: No Dressing: No Hobbies: No Electronic Signature(s) Signed: 03/08/2019 6:03:31 PM By: [name] RN Entered By: [name] on 01/14/2019 10:40:19 -------------------------------------------------------------------------------- Patient/Caregiver Education Details Patient Name: Date of Service: [name], [name] 8/14/2020andnbsp10:00 AM Medical Record [id number] Patient Account Number: [id number] Date of Birth/Gender: 23-May-1956 (62 y.o. F) Treating RN: Baruch Gouty Primary Care  Physician: [name] Other Clinician: Referring Physician: Treating Physician/Extender:Madduri, Cherly Beach in Treatment: 64 Education Assessment Education Provided To: Patient Education Topics Provided Venous: Methods: Explain/Verbal Responses: Reinforcements needed, State content correctly Wound/Skin Impairment: Methods: Explain/Verbal Responses: Reinforcements needed, State content correctly Electronic Signature(s) Signed: 01/14/2019 5:20:10 PM By: Baruch Gouty RN, BSN Entered By: Baruch Gouty on 01/14/2019 10:57:57 -------------------------------------------------------------------------------- Wound Assessment Details Patient Name: Date of Service: [name], [name] 01/14/2019 10:00 AM Medical Record [id number] Patient Account Number: [id number] Date of Birth/Sex: Treating RN: 15-Feb-1956 (63 y.o. [name] Primary Care Karem Tomaso: [name] Other Clinician: Referring Zowie Lundahl: Treating Lun Muro/Extender:Madduri, Cherly Beach in Treatment: 318 Wound Status Wound Number: 3 Primary Venous Leg Ulcer Etiology: Wound Location: Left Malleolus - Medial Wound Open Wounding Event: Gradually Appeared Status: Date Acquired: 11/15/2012 Comorbid Congestive Heart Failure, Peripheral Vascular Weeks Of Treatment: 318 History: Disease, Congestive Heart Failure, End Clustered Wound: No Stage Renal Disease, Tobacco Use, Chronic Obstructive Pulmonary Disease (COPD), Type 1 Diabetes Photos Wound Measurements Length: (cm) 2.3 % Reduc Width: (cm) 1.7 % Reduc Depth: (cm) 0.1 Epithel Area: (cm) 3.071 Tunnel Volume: (cm) 0.307 Underm Wound Description Full Thickness Without Exposed Support Foul O Classification: Structures Slough Wound Flat and Intact Margin: Exudate Medium Amount: Exudate Serosanguineous Type: Exudate red, brown Color: Wound Bed Granulation Amount: Large (67-100%) Granulation Quality: Red, Pink  Fascia Necrotic Amount: Small (1-33%) Fat La Necrotic Quality: Adherent Slough Tendon Muscle Expos Joint Expose Bone Exposed dor After Cleansing: No /Fibrino Yes Exposed Structure Exposed: No yer (Subcutaneous Tissue) Exposed: Yes Exposed: No ed: No d: No : No tion in Area: 2.3% tion in Volume: 51.1% ialization: Small (1-33%) ing: No ining: No Electronic Signature(s) Signed: 01/18/2019 1:57:29 PM By: Mikeal Hawthorne EMT/HBOT Signed: 03/08/2019 6:03:31 PM By: [name] RN Entered By: Mikeal Hawthorne on 01/17/2019 09:20:07 -------------------------------------------------------------------------------- Vitals Details Patient Name: Date of Service: [name], [name] 01/14/2019 10:00 AM Medical Record [id number] Patient Account Number: [id number] Date of Birth/Sex: Treating RN: 1955-12-24 (63 y.o. [name] Primary Care Valgene Deloatch: [name] Other Clinician: Referring Ma Munoz: Treating Seleni Meller/Extender:Madduri, Cherly Beach in Treatment: 318 Vital Signs Time Taken: 10:38 Temperature (F): 98.2 Height (in): 68 Pulse (bpm): 57 Weight (lbs): 127 Respiratory Rate (breaths/min): 16 Body Mass Index (BMI): 19.3 Blood Pressure (mmHg): 109/61 Reference Range: 80 - 120 mg / dl Electronic Signature(s) Signed: 03/08/2019 6:03:31 PM By: [name] RN Entered By: [name] on 01/14/2019 10:39:23

## 2019-03-11 ENCOUNTER — Encounter (HOSPITAL_BASED_OUTPATIENT_CLINIC_OR_DEPARTMENT_OTHER): Payer: Medicare Other | Admitting: Internal Medicine

## 2019-03-11 ENCOUNTER — Other Ambulatory Visit: Payer: Self-pay

## 2019-03-11 DIAGNOSIS — L97322 Non-pressure chronic ulcer of left ankle with fat layer exposed: Secondary | ICD-10-CM | POA: Diagnosis not present

## 2019-03-14 ENCOUNTER — Other Ambulatory Visit: Payer: Self-pay

## 2019-03-14 ENCOUNTER — Encounter: Payer: Medicare Other | Attending: Physical Medicine & Rehabilitation | Admitting: Physical Medicine & Rehabilitation

## 2019-03-14 ENCOUNTER — Encounter: Payer: Self-pay | Admitting: Physical Medicine & Rehabilitation

## 2019-03-14 VITALS — BP 119/50 | HR 53 | Temp 97.7°F | Ht 68.0 in | Wt 137.0 lb

## 2019-03-14 DIAGNOSIS — M79604 Pain in right leg: Secondary | ICD-10-CM | POA: Diagnosis not present

## 2019-03-14 DIAGNOSIS — R269 Unspecified abnormalities of gait and mobility: Secondary | ICD-10-CM

## 2019-03-14 DIAGNOSIS — M79605 Pain in left leg: Secondary | ICD-10-CM | POA: Diagnosis not present

## 2019-03-14 DIAGNOSIS — R292 Abnormal reflex: Secondary | ICD-10-CM | POA: Diagnosis present

## 2019-03-14 DIAGNOSIS — M21542 Acquired clubfoot, left foot: Secondary | ICD-10-CM | POA: Diagnosis present

## 2019-03-14 DIAGNOSIS — G8929 Other chronic pain: Secondary | ICD-10-CM

## 2019-03-14 NOTE — Progress Notes (Signed)
Subjective:    Patient ID: [name], female    DOB: 1955-11-06, 63 y.o.   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    Botox last injected on 01/17/2019.  Since that time, she notes benefit with ambuation, greater with dorsiflexion than eversion.  Denies falls.   Pain Inventory Average Pain 5 Pain Right Now 4 My pain is constant, sharp and dull  In the last 24 hours, has pain interfered with the following? General activity 5 Relation with others 5 Enjoyment of life 7 What TIME of day is your pain at its worst? daytime Sleep (in general) Fair  Pain is worse with: walking and standing Pain improves with: rest Relief from Meds: spometimes  Mobility walk with assistance use a cane ability to climb steps?  no do you drive?  yes Do you have any goals in this area?  yes  Function retired  Neuro/Psych trouble walking  Prior Studies Any changes since last visit?  no  Physicians involved in your care Any changes since last visit?  no   Family History  Problem Relation Age of Onset  . Hypertension Mother   . Lung cancer Father   . Diabetes Maternal Grandmother   . Diabetes Paternal Grandmother   . Healthy Brother    Social History   Socioeconomic History  . Marital status: Married    Spouse name: Not on file  . Number of children: 0  . Years of education: 23  . Highest education level: Not on file  Occupational History  . Occupation: retired Glass blower/designer  Social Needs  . Financial resource strain: Not on file  . Food insecurity    Worry: Not on file    Inability: Not on file  . Transportation needs    Medical: Not on file    Non-medical: Not on file  Tobacco Use  . Smoking status: Former Smoker    Packs/day: 0.25    Years: 26.00    Pack years: 6.50    Types: Cigarettes    Quit date: 04/25/2007    Years since quitting: 11.8  . Smokeless tobacco: Never Used   Substance and Sexual Activity  . Alcohol use: Yes    Alcohol/week: 0.0 standard drinks    Comment: occasionally  . Drug use: No  . Sexual activity: Yes  Lifestyle  . Physical activity    Days per week: Not on file    Minutes per session: Not on file  . Stress: Not on file  Relationships  . Social Herbalist on phone: Not on file    Gets together: Not on file    Attends religious service: Not on file    Active member of club or organization: Not on file    Attends meetings of clubs or organizations: Not on file    Relationship status: Not on file  Other Topics Concern  . Not on file  Social History Narrative   Patient lives with husband in a one story home. Has no children.  Took early retirement due to medical condition.  Formerly worked as a Glass blower/designer.  Education: 12th.     Past Surgical History:  Procedure Laterality Date  . APPLICATION OF WOUND VAC Bilateral 12/12/2015   Procedure: APPLICATION OF WOUND VAC;  Surgeon: [name], DO;  Location: [place];  Service: Plastics;  Laterality: Bilateral;  . DRESSING CHANGE UNDER  ANESTHESIA Right 02/14/2016   Procedure: DRESSING CHANGE UNDER ANESTHESIA;  Surgeon: [name], DO;  Location: [place];  Service: Plastics;  Laterality: Right;  . [place]  . I&D EXTREMITY Bilateral 12/12/2015   Procedure: DEBRIDEMENT OF BILATERAL LEG WOUNDS;  Surgeon: [name], DO;  Location: [place];  Service: Plastics;  Laterality: Bilateral;  . I&D EXTREMITY Bilateral 01/07/2016   Procedure: IRRIGATION AND DEBRIDEMENT OF BILATERAL LEGS;  Surgeon: [name], DO;  Location: [place];  Service: Plastics;  Laterality: Bilateral;  . PERIPHERAL VASCULAR CATHETERIZATION N/A 10/15/2015   Procedure: Abdominal Aortogram w/Lower Extremity;  Surgeon: Angelia Mould, MD;  Location: [place] [facility];  Service:  Cardiovascular;  Laterality: N/A;  . RADIOLOGY WITH ANESTHESIA N/A 02/24/2019   Procedure: MIR CERVICAL WITH AND WITHOUT CONTRAST;  Surgeon: Radiologist, Medication, MD;  Location: [place];  Service: Radiology;  Laterality: N/A;  . skin graph    . TONSILLECTOMY     as a child  . VEIN LIGATION AND STRIPPING Left late 1990's  . WOUND DEBRIDEMENT Left 02/14/2016   Procedure: SKIN GRAFT SPLIT THICKNESS TO LEFT ANKLE;  Surgeon: [name], DO;  Location: [place];  Service: Plastics;  Laterality: Left;   Past Medical History:  Diagnosis Date  . Allergy   . Ankle wound 02/2016   bilateral  . Dental bridge present    lower  . Dental crowns present    upper right  . Open wounds involving multiple regions of lower extremity   . Peripheral vascular disease (HCC)    venous stasis ulcers bilateral lower extremities  . PONV (postoperative nausea and vomiting)    BP (!) 119/50   Pulse (!) 53   Temp 97.7 F (36.5 C)   Ht 5\' 8"  (1.727 m)   Wt 137 lb (62.1 kg)   SpO2 98%   BMI 20.83 kg/m   Opioid Risk Score:   Fall Risk Score:  `1  Depression screen PHQ 2/9  Depression screen [facility] 2/9 05/18/2017 04/27/2017 04/07/2017  Decreased Interest 0 0 1  Down, Depressed, Hopeless 0 0 1  PHQ - 2 Score 0 0 2  Altered sleeping - - 2  Tired, decreased energy - - 1  Change in appetite - - 1  Feeling bad or failure about yourself  - - 1  Trouble concentrating - - 1  Moving slowly or fidgety/restless - - 1  Suicidal thoughts - - 0  PHQ-9 Score - - 9  Difficult doing work/chores - - Not difficult at all   Review of Systems  Constitutional: Negative.   HENT: Negative.   Eyes: Negative.   Respiratory: Negative.   Cardiovascular: Negative.   Gastrointestinal: Negative.   Endocrine: Negative.   Genitourinary: Negative.   Musculoskeletal: Positive for gait problem.  Skin: Positive for wound.  Allergic/Immunologic: Negative.   Hematological: Negative.    Psychiatric/Behavioral: Negative.       Objective:   Physical Exam Constitutional: No distress . Vital signs reviewed. HENT: Normocephalic.  Atraumatic. Eyes: EOMI. No discharge. Cardiovascular: No JVD. Respiratory: Normal effort.  No stridor. GI: Non-distended. Skin: LLE wrapped. Psych: Normal mood.  Normal behavior. Musc: Left foot: Equinovarus contracture Neuro: Alert LLE: HF, KE 5/5, Ankle contracture, but able to extend digits and minimal dorisflexion RLE: 5/5 proximal to distal    Assessment & Plan:  Female with hx of vascular ulcers/wounds on L>R LEs, gait abnormality due  to equinovarus contracture.    1. Left equinovarus with gait abnormality  Cont recs per Dr. Alveta Heimlich  Will increase Botox:    Left Med Gastroc:       100   Left Lat Gastroc:         100   Left Soleus:                 100   Left FHL:                     100

## 2019-03-15 NOTE — Progress Notes (Signed)
DEEM, MARMOL ([id number]) Visit Report for 03/11/2019 Arrival Information Details Patient Name: Date of Service: [name], [name] 03/11/2019 10:00 AM Medical Record [id number] Patient Account Number: [id number] Date of Birth/Sex: Treating RN: [birth year] (63 y.o. F) [name] Primary Care Dontrelle Mazon: [name] Other Clinician: [name] Referring Tarek Cravens: Treating Chitara Clonch/Extender:Robson, Tawny Asal in Treatment: 74 Visit Information History Since Last Visit Cane Added or deleted any medications: No Patient Arrived: 09:59 Any new allergies or adverse reactions: No Arrival Time: Had a fall or experienced change in No Accompanied By: self None activities of daily living that may affect Transfer Assistance: risk of falls: Patient Identification Verified: Yes Signs or symptoms of abuse/neglect since last No Secondary Verification Process Completed: Yes visito Patient Requires Transmission-Based No Hospitalized since last visit: No Precautions: Implantable device outside of the clinic excluding No Patient Has Alerts: No cellular tissue based products placed in the center since last visit: Has Dressing in Place as Prescribed: Yes Has Compression in Place as Prescribed: Yes Pain Present Now: No Electronic Signature(s) Signed: 03/11/2019 5:13:27 PM By: Kela Millin Entered By: Kela Millin on 03/11/2019 10:01:24 -------------------------------------------------------------------------------- Compression Therapy Details Patient Name: Date of Service: [name] 03/11/2019 10:00 AM Medical Record [id number] Patient Account Number: [id number] Date of Birth/Sex: Treating RN: May 26, 1956 (63 y.o. [name] Primary Care Steph Cheadle: [name] Other Clinician: [name] Referring Maximum Reiland: Treating Lahari Suttles/Extender:Robson, Tawny Asal in Treatment: 326 Compression Therapy Performed  for Wound Wound #11 Left,Anterior Ankle Assessment: Performed By: Clinician Levan Hurst, RN Compression Type: Rolena Infante Post Procedure Diagnosis Same as Pre-procedure Electronic Signature(s) Signed: 03/15/2019 5:52:09 PM By: Levan Hurst RN, BSN Entered By: Levan Hurst on 03/11/2019 10:31:41 -------------------------------------------------------------------------------- Compression Therapy Details Patient Name: Date of Service: [name] 03/11/2019 10:00 AM Medical Record [id number] Patient Account Number: [id number] Date of Birth/Sex: Treating RN: 05-Jun-1955 (63 y.o. [name] Primary Care Lorain Keast: [name] Other Clinician: [name] Referring Mills Mitton: Treating Jarmarcus Wambold/Extender:Robson, Tawny Asal in Treatment: 326 Compression Therapy Performed for Wound Wound #3 Left,Medial Malleolus Assessment: Performed By: Clinician Levan Hurst, RN Compression Type: Rolena Infante Post Procedure Diagnosis Same as Pre-procedure Electronic Signature(s) Signed: 03/15/2019 5:52:09 PM By: Levan Hurst RN, BSN Entered By: Levan Hurst on 03/11/2019 10:31:41 -------------------------------------------------------------------------------- Encounter Discharge Information Details Patient Name: Date of Service: [name] 03/11/2019 10:00 AM Medical Record [id number] Patient Account Number: [id number] Date of Birth/Sex: Treating RN: Mar 30, 1956 (63 y.o. [name] Primary Care Jahliyah Trice: [name] Other Clinician: [name] Referring Dashauna Heymann: Treating Romin Divita/Extender:Robson, Tawny Asal in Treatment: 326 Encounter Discharge Information Items Discharge Condition: Stable Ambulatory Status: Cane Discharge Destination: Home Transportation: Private Auto Accompanied By: self Schedule Follow-up Appointment: Yes Clinical Summary of Care: Patient Declined Electronic  Signature(s) Signed: 03/11/2019 5:13:27 PM By: Kela Millin Entered By: Kela Millin on 03/11/2019 11:44:43 -------------------------------------------------------------------------------- Lower Extremity Assessment Details Patient Name: Date of Service: [name] 03/11/2019 10:00 AM Medical Record [id number] Patient Account Number: [id number] Date of Birth/Sex: Treating RN: December 14, 1955 (63 y.o. [name] Primary Care Milderd Manocchio: [name] Other Clinician: [name] Referring Abbygail Willhoite: Treating Tai Skelly/Extender:Robson, Velva Harman, Essie Christine in Treatment: 326 Edema Assessment Assessed: [Left: No] [Right: No] Edema: [Left: N] [Right: o] Calf Left: Right: Point of Measurement: 33 cm From Medial Instep 29 cm cm Ankle Left: Right: Point of Measurement: 12 cm From Medial Instep 21.5 cm cm Vascular Assessment Pulses: Dorsalis Pedis Palpable: [Left:Yes] Electronic Signature(s) Signed: 03/11/2019 5:13:27 PM By: Kela Millin Entered  By: Kela Millin on 03/11/2019 10:05:50 -------------------------------------------------------------------------------- Multi Wound Chart Details Patient Name: Date of Service: [name] 03/11/2019 10:00 AM Medical Record [id number] Patient Account Number: [id number] Date of Birth/Sex: Treating RN: [birth year] (63 y.o. [name] Primary Care Cayne Yom: [name] Other Clinician: [name] Referring Hasna Stefanik: Treating Hoy Fallert/Extender:Robson, Velva Harman, Essie Christine in Treatment: 326 Vital Signs Height(in): 68 Pulse(bpm): 59 Weight(lbs): 127 Blood Pressure(mmHg): 124/42 Body Mass Index(BMI): 19 Temperature(F): 98.0 Respiratory 17 Rate(breaths/min): Photos: [11:No Photos] [3:No Photos] [N/A:N/A] Wound Location: [11:Left Ankle - Anterior] [3:Left Malleolus - Medial] [N/A:N/A] Wounding Event: [11:Blister] [3:Gradually Appeared] [N/A:N/A] Primary  Etiology: [11:Venous Leg Ulcer] [3:Venous Leg Ulcer] [N/A:N/A] Comorbid History: [11:Congestive Heart Failure, Congestive Heart Failure, N/A Peripheral Vascular Disease, Congestive Heart Disease, Congestive Heart Failure, End Stage Renal Failure, End Stage Renal Disease, Tobacco Use, Chronic Obstructive Pulmonary  Disease (COPD), Type 1 Diabetes (COPD), Type 1 Diabetes] [3:Peripheral Vascular Disease, Tobacco Use, Chronic Obstructive Pulmonary Disease] Date Acquired: [11:03/04/2019] [3:11/15/2012] [N/A:N/A] Weeks of Treatment: [11:0] [3:326] [N/A:N/A] Wound Status: [11:Open] [3:Open] [N/A:N/A] Measurements L x W x D 0.7x0.7x0.1 [3:1.7x1.9x0.1] [N/A:N/A] (cm) Area (cm) : [11:0.385] [3:2.537] [N/A:N/A] Volume (cm) : [11:0.038] [3:0.254] [N/A:N/A] % Reduction in Area: [11:N/A] [3:19.30%] [N/A:N/A] % Reduction in Volume: N/A [3:59.60%] [N/A:N/A] Classification: [11:Full Thickness Without Exposed Support Structures Exposed Support Structures] [3:Full Thickness Without] [N/A:N/A] Exudate Amount: [11:Small] [3:Medium] [N/A:N/A] Exudate Type: [11:Serosanguineous] [3:Serosanguineous] [N/A:N/A] Exudate Color: [11:red, brown] [3:red, brown] [N/A:N/A] Wound Margin: [11:Distinct, outline attached Flat and Intact] [N/A:N/A] Granulation Amount: [11:Large (67-100%)] [3:Large (67-100%)] [N/A:N/A] Granulation Quality: [11:Red, Pink] [3:Pink] [N/A:N/A] Necrotic Amount: [11:None Present (0%)] [3:Small (1-33%)] [N/A:N/A] Exposed Structures: [11:Fat Layer (Subcutaneous Fat Layer (Subcutaneous N/A Tissue) Exposed: Yes Fascia: No Tendon: No Muscle: No Joint: No Bone: No] [3:Tissue) Exposed: Yes Fascia: No Tendon: No Muscle: No Joint: No Bone: No] Epithelialization: [11:None] [3:None Compression Therapy] [N/A:N/A N/A] Treatment Notes Electronic Signature(s) Signed: 03/13/2019 10:27:00 AM By: Linton Ham MD Signed: 03/15/2019 5:52:09 PM By: Levan Hurst RN, BSN Entered By: Linton Ham on 03/11/2019  11:01:31 -------------------------------------------------------------------------------- Multi-Disciplinary Care Plan Details Patient Name: Date of Service: [name] 03/11/2019 10:00 AM Medical Record [id number] Patient Account Number: [id number] Date of Birth/Sex: Treating RN: 1955/12/19 (63 y.o. [name] Primary Care Chuckie Mccathern: [name] Other Clinician: [name] Referring Kailer Heindel: Treating Melanny Wire/Extender:Robson, Tawny Asal in Treatment: 326 Active Inactive Venous Leg Ulcer Nursing Diagnoses: Actual venous Insuffiency (use after diagnosis is confirmed) Goals: Patient will maintain optimal edema control Date Initiated: 09/04/2017 Target Resolution Date: 03/18/2019 Goal Status: Active Patient/caregiver will verbalize understanding of disease process and disease management Date Initiated: 08/03/2014 Date Inactivated: 08/28/2017 Target Resolution Date: 08/29/2017 Goal Status: Met Interventions: Compression as ordered Treatment Activities: Therapeutic compression applied : 09/21/2015 Notes: Wound/Skin Impairment Nursing Diagnoses: Impaired tissue integrity Knowledge deficit related to ulceration/compromised skin integrity Goals: Patient will have a decrease in wound volume by X% from date: (specify in notes) Date Initiated: 04/05/2015 Date Inactivated: 02/12/2017 Target Resolution Date: 09/01/2016 Unmet Reason: chronic, Goal Status: Unmet unable to tolerate stronger compression Patient/caregiver will verbalize understanding of skin care regimen Date Initiated: 04/05/2015 Target Resolution Date: 03/18/2019 Goal Status: Active Interventions: Assess patient/caregiver ability to obtain necessary supplies Assess patient/caregiver ability to perform ulcer/skin care regimen upon admission and as needed Assess ulceration(s) every visit Provide education on ulcer and skin care Notes: Electronic Signature(s) Signed: 03/15/2019  5:52:09 PM By: Levan Hurst RN, BSN Entered By: Levan Hurst on 03/11/2019 10:18:14 -------------------------------------------------------------------------------- Pain Assessment Details Patient Name: Date of Service: Laurena Bering  G. 03/11/2019 10:00 AM Medical Record [id number] Patient Account Number: [id number] Date of Birth/Sex: Treating RN: 30-Jun-1955 (63 y.o. [name] Primary Care Jillianna Stanek: [name] Other Clinician: [name] Referring Makeshia Seat: Treating Latona Krichbaum/Extender:Robson, Tawny Asal in Treatment: 326 Active Problems Location of Pain Severity and Description of Pain Patient Has Paino No Site Locations Pain Management and Medication Current Pain Management: Electronic Signature(s) Signed: 03/11/2019 5:13:27 PM By: Kela Millin Entered By: Kela Millin on 03/11/2019 10:01:59 -------------------------------------------------------------------------------- Patient/Caregiver Education Details Patient Name: Date of Service: [name] 10/9/2020andnbsp10:00 AM Medical Record [id number] Patient Account Number: [id number] Date of Birth/Gender: 1955-11-25 (63 y.o. F) Treating RN: Levan Hurst Primary Care Physician: [name] Other Clinician: [name] Referring Physician: Treating Physician/Extender:Robson, Tawny Asal in Treatment: 70 Education Assessment Education Provided To: Patient Education Topics Provided Wound/Skin Impairment: Methods: Explain/Verbal Responses: State content correctly Electronic Signature(s) Signed: 03/15/2019 5:52:09 PM By: Levan Hurst RN, BSN Entered By: Levan Hurst on 03/11/2019 10:18:31 -------------------------------------------------------------------------------- Wound Assessment Details Patient Name: Date of Service: [name] 03/11/2019 10:00 AM Medical Record [id number] Patient Account Number:  [id number] Date of Birth/Sex: Treating RN: [birth year] (63 y.o. [name] Primary Care Eliaz Fout: [name] Other Clinician: [name] Referring Cleopatra Sardo: Treating Vanessa Kampf/Extender:Robson, Velva Harman, Essie Christine in Treatment: 326 Wound Status Wound Number: 11 Primary Venous Leg Ulcer Etiology: Wound Location: Left Ankle - Anterior Wound Open Wounding Event: Blister Status: Date Acquired: 03/04/2019 Comorbid Congestive Heart Failure, Peripheral Vascular Weeks Of Treatment: 0 History: Disease, Congestive Heart Failure, End Stage Clustered Wound: No Renal Disease, Tobacco Use, Chronic Obstructive Pulmonary Disease (COPD), Type 1 Diabetes Photos Wound Measurements Length: (cm) 0.7 % Reduct Width: (cm) 0.7 % Reduct Depth: (cm) 0.1 Epitheli Area: (cm) 0.385 Tunneli Volume: (cm) 0.038 Undermi Wound Description Classification: Full Thickness Without Exposed Support Foul Odo Structures Slough/F Wound Distinct, outline attached Margin: Exudate Small Amount: Exudate Serosanguineous Type: Exudate red, brown Color: Wound Bed Granulation Amount: Large (67-100%) Granulation Quality: Red, Pink Fascia E Necrotic Amount: None Present (0%) Fat Laye Tendon E Muscle E Joint Ex Bone Exp r After Cleansing: No ibrino No Exposed Structure xposed: No r (Subcutaneous Tissue) Exposed: Yes xposed: No xposed: No posed: No osed: No ion in Area: 0% ion in Volume: 0% alization: None ng: No ning: No Treatment Notes Wound #11 (Left, Anterior Ankle) 1. Cleanse With Wound Cleanser Soap and water 2. Periwound Care Barrier cream Moisturizing lotion TCA Cream 3. Primary Dressing Applied Hydrofera Blue 4. Secondary Dressing Dry Gauze Kerramax/Xtrasorb 6. Support Layer Teacher, English as a foreign language Boot Notes [place]. no kerlix Electronic Signature(s) Signed: 03/14/2019 3:43:33 PM By: Mikeal Hawthorne EMT/HBOT Signed: 03/14/2019 5:18:03 PM By: Kela Millin Previous Signature: 03/11/2019 5:13:27 PM Version By: Kela Millin Entered By: Mikeal Hawthorne on 03/14/2019 08:45:11 -------------------------------------------------------------------------------- Wound Assessment Details Patient Name: Date of Service: [name] 03/11/2019 10:00 AM Medical Record [id number] Patient Account Number: [id number] Date of Birth/Sex: Treating RN: Dec 27, 1955 (63 y.o. [name] Primary Care Sybrina Laning: [name] Other Clinician: [name] Referring Hazel Wrinkle: Treating Grover Robinson/Extender:Robson, Velva Harman, Essie Christine in Treatment: 326 Wound Status Wound Number: 3 Primary Venous Leg Ulcer Etiology: Wound Location: Left Malleolus - Medial Wound Open Wounding Event: Gradually Appeared Status: Date Acquired: 11/15/2012 Comorbid Congestive Heart Failure, Peripheral Vascular Weeks Of Treatment: 326 History: Disease, Congestive Heart Failure, End Stage Clustered Wound: No Renal Disease, Tobacco Use, Chronic Obstructive Pulmonary Disease (COPD), Type 1 Diabetes Wound Measurements Length: (cm) 1.7 % Reduc Width: (cm) 1.9 % Reduc  Depth: (cm) 0.1 Epithel Area: (cm) 2.537 Tunnel Volume: (cm) 0.254 Underm Wound Description Classification: Full Thickness Without Exposed Support Structures Wound Flat and Intact Margin: Exudate Medium Amount: Exudate Serosanguineous Type: Exudate red, brown Color: Wound Bed Granulation Amount: Large (67-100%) Granulation Quality: Pink Necrotic Amount: Small (1-33%) Necrotic Quality: Adherent Slough Foul Odor After Cleansing: No Slough/Fibrino Yes Exposed Structure Fascia Exposed: No Fat Layer (Subcutaneous Tissue) Exposed: Yes Tendon Exposed: No Muscle Exposed: No Joint Exposed: No Bone Exposed: No tion in Area: 19.3% tion in Volume: 59.6% ialization: None ing: No ining: No Treatment Notes Wound #3 (Left, Medial Malleolus) 1. Cleanse With Wound  Cleanser Soap and water 2. Periwound Care Barrier cream Moisturizing lotion TCA Cream 3. Primary Dressing Applied Hydrofera Blue 4. Secondary Dressing Dry Gauze Kerramax/Xtrasorb 6. Support Layer Teacher, English as a foreign language Boot Notes [place]. no kerlix Electronic Signature(s) Signed: 03/11/2019 5:13:27 PM By: Kela Millin Entered By: Kela Millin on 03/11/2019 10:08:29 -------------------------------------------------------------------------------- Vitals Details Patient Name: Date of Service: [name] 03/11/2019 10:00 AM Medical Record [id number] Patient Account Number: [id number] Date of Birth/Sex: Treating RN: [birth year] (63 y.o. [name] Primary Care Hjalmer Iovino: [name] Other Clinician: [name] Referring Gearline Spilman: Treating Evolett Somarriba/Extender:Robson, Tawny Asal in Treatment: 326 Vital Signs Time Taken: 10:00 Temperature (F): 98.0 Height (in): 68 Pulse (bpm): 59 Weight (lbs): 127 Respiratory Rate (breaths/min): 17 Body Mass Index (BMI): 19.3 Blood Pressure (mmHg): 124/42 Reference Range: 80 - 120 mg / dl Electronic Signature(s) Signed: 03/11/2019 5:13:27 PM By: Kela Millin Entered By: Kela Millin on 03/11/2019 10:01:50

## 2019-03-15 NOTE — Progress Notes (Signed)
[name], [name] ([id number]) Visit Report for 03/11/2019 HPI Details Patient Name: Date of Service: [name], [name] 03/11/2019 10:00 AM Medical Record [id number] Patient Account Number: [id number] Date of Birth/Sex: Treating RN: [birth year] (63 y.o. [name] Primary Care Provider: [name] Other Clinician: [name] Referring Provider: Treating Provider/Extender:Karem Farha, Tawny Asal in Treatment: 326 History of Present Illness HPI Description: the remaining wound is over the left medial ankle. Similar wound over the right medial ankle healed largely with use of Apligraf. Most recently we have been using Hydrofera Blue over this wound with considerable improvement. The patient has been extensively worked up in the past for her venous insufficiency and she is not a candidate for antireflux surgery although I have none of the details available currently. 08/24/14; considerable improvement today. About 50% of this wound areas now epithelialized. The base of the wound appears to be healthier granulation.as opposed to last week when she had deteriorated a considerable improvement 08/17/14; unfortunately the wound has regressed somewhat. The areas of epithelialization from the superior aspect are not nearly as healthy as they were last week. The patient thinks her Hydrofera Blue slipped. 09/07/14; unfortunately the area has markedly regressed in the 2 weeks since I've seen this. There is an odor surrounding erythema. The healthy granulation tissue that we had at the base of the wound now is a dusky color. The nurse reports green drainage 09/14/14; the area looks somewhat better than last week. There is less erythema and less drainage. The culture I did did not show any growth. Nevertheless I think it is better to continue the Cipro and doxycycline for a further week. The remaining wound area was debridement. 09/21/14. Wound did not require debridement last  week. Still less erythema and less drainage. She can complete her antibiotics. The areas of epithelialization in the superior aspect of the wound do not look as healthy as they did some weeks ago 10/05/14 continued improvement in the condition of this wound. There is advancing epithelialization. Less aggressive debridement required 10/19/14 continued improvement in the condition and volume of this wound. Less aggressive debridement to the inferior part of this to remove surface slough and fibrinous eschar 11/02/14 no debridement is required. The surface granulation appears healthy although some of her islands of epithelialization seem to have regressed. No evidence of infection 11/16/14; lites surface debridement done of surface eschar. The wound does not look to be unhealthy. No evidence of infection. Unfortunately the patient has had podiatry issues in the right foot and for some reason has redeveloped small surface ulcerations in the medial right ankle. Her original presentation involved wounds in this area 11/23/14 no debridement. The area on the right ankle has enlarged. The left ankle wound appears stable in terms of the surface although there is periwound inflammation. There has been regression in the amount of new skin 11/30/14 no debridement. Both wound areas appear healthy. There was no evidence of infection. The the new area on the right medial ankle has enlarged although that both the surfaces appear to be stable. 12/07/14; Debridement of the right medial ankle wound. No no debridement was done on the left. 12/14/14 no major change in and now bilateral medial ankle wounds. Both of these are very painful but the no overt evidence of infection. She has had previous venous ablation 12/21/14; patient states that her right medial ankle wound is considerably more painful last week than usual. Her left is also somewhat painful. She could not tolerate debridement. The right  medial ankle wound has  fibrinous surface eschar 12/28/14 this is a patient with severe bilateral venous insufficiency ulcers. For a considerable period of time we actually had the one on the right medial ankle healed however this recently opened up again in June. The left medial ankle wound has been a refractory area with some absent flows. We had some success with Hydrofera Blue on this area and it literally closed by 50% however it is recently opened up Foley. Both of these were debridement today of surface eschar. She tolerates this poorly 01/25/15: No change in the status of this. Thick adherent escar. Very poor tolerance of any attempt at debridement. I had healed the right medial malleolus wound for a considerable amount of time and had the left one down to about 50% of the volume although this is totally regressed over the last 48 weeks. Further the right leg has reopened. she is trying to make a appointment with pain and vascular, previous ablations with Dr. Aleda Grana. I do not believe there is an arterial insufficiency issue here 02/01/15 the status of the adherent eschar bilaterally is actually improved. No debridement was done. She did not manage to get vascular studies done 02/08/15 continued debridement of the area was done today. The slough is less adherent and comes off with less pressure. There is no surrounding infection peripheral pulses are intact 02/15/15 selective debridement with a disposable curette. Again the slough is less adherent and comes off with less difficulty. No surrounding infection peripheral pulses are intact. 02/22/15 selective debridement of the right medial ankle wound. Slough comes off with less difficulty. No obvious surrounding infection peripheral pulses are intact I did not debridement the one on the left. Both of these are stable to improved 03/01/15 selective debridement of both wound areas using a curette to. Adherent slough cup soft with less difficulty. No obvious  surrounding infection. The patient tells me that 2 days ago she noted a rash above the right leg wrap. She did not have this on her lower legs when she change this over she arrives with widespread left greater than right almost folliculitis-looking rash which is extremely pruritic. I don't see anything to culture here. There is no rash on the rest of her body. She feels well systemically. 03/08/15; selective debridement of both wounds using a curette. Base of this does not look unhealthy. She had limegreen drainage coming out of the left leg wound and describes a lot of drainage. The rash on her left leg looks improved to. No cultures were done. 03/22/15; patient was not here last week. Basal wounds does not look healthy and there is no surrounding erythema. No drainage. There is still a rash on the left leg that almost looks vasculitic however it is clearly limited to the top of where the wrap would be. 04/05/15; on the right required a surgical debridement of surface eschar and necrotic subcutaneous tissue. I did not debridement the area on the left. These continue to be large open wounds that are not changing that much. We were successful at one point in healing the area on the right, and at the same time the area on the left was roughly half the size of current measurements. I think a lot of the deterioration has to do with the prolonged time the patient is on her feet at work 04/19/15 I attempted-like surface debridement bilaterally she does not tolerate this. She tells me that she was in allergic care yesterday with extreme pain over her  left lateral malleolus/ankle and was told that she has an "sprain" 05/03/15; large bilateral venous insufficiency wounds over the medial malleolus/medial aspect of her ankles. She complains of copious amounts of drainage and his usual large amounts of pain. There is some increasing erythema around the wound on the right extending into the medial aspect of her  foot to. historically she came in with these wounds the right one healed and the left one came down to roughly half its current size however the right one is reopened and the left is expanded. 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 05/10/15 large bilateral venous insufficiency wounds. There is less adherence surface left however the surface culture that I did last week grew pseudomonas therefore bilateral selective debridement score necessary. There is surrounding erythema. The patient describes severe bilateral drainage and a lot of pain in the left ankle. Apparently her podiatrist was were ready to do a cortisone shot 05/17/15; the patient complains of pain and again copious amounts of drainage. 05/24/15; we used Iodo flex last week. Patient notes considerable improvement in wound drainage. Only needed to change this once. 05/31/15; we continued Iodoflex; the base of these large wounds bilaterally is not too bad but there is probably likely a significant bioburden here. I would like to debridement just doesn't tolerate it. 06/06/14 I would like to continue the Iodoflex although she still hasn't managed to obtain supplies. She has bilateral medial malleoli or large wounds which are mostly superficial. Both of them are covered circumferentially with some nonviable fibrinous slough although she tolerates debridement very poorly. She apparently has an appointment for an ablation on the right leg by interventional radiology. 06/14/15; the patient arrives with the wounds and static condition. We attempted a debridement although she does not do well with this secondary to pain. I 07/05/15; wounds are not much smaller however there appears to be a cleaner granulating base. The left has tight fibrinous slough greater than the right. Debridement is tolerated poorly due to pain. Iodoflex is done more for these wounds in any of the multitude of different  dressings I have tried on the left 1 and then subsequently the right. 07/12/15; no change in the condition of this wound. I am able to do an aggressive debridement on the right but not the left. She simply cannot tolerate it. We have been using Iodoflex which helps somewhat. It is worthwhile remembering that at one point we healed the right medial ankle wound and the left was about 25% of the current circumference. We have suggested returning to vascular surgery for review of possible further ablations for one reason or another she has not been able to do this. 07/26/15 no major change in the condition of either wound on her medial ankle. I did not attempt to debridement of these. She has been aggressively scrubbing these while she is in the shower at home. She has her supply of Iodoflex which seems to have done more for these wounds then anything I have put on recently. 08/09/15 wound area appears larger although not verified by measurements. Using Iodoflex 09/05/2015 -- she was here for avisit today but had significant problems with the wound and I was asked to see her for a physician opinion. I have summarize that this lady has had surgery on her left lower extremity about 10 years ago where the possible veins stripping was done. She has had an opinion from  interventional radiology around November 2016 where no further sclerotherapy was ordered. The patient works 12 hours a day and stands on a concrete floor with work boots and is unable to get the proper compression she requires and cannot elevate her limbs appropriately at any given time. She has recently grown Pseudomonas from her wound culture but has not started her ciprofloxacin which was called in for her. 09/13/15 this continues to be a difficult situation for this patient. At one point I had this wound down to a 1.5 x 1.5" wound on her left leg. This is deteriorated and the right leg has reopened. She now has substantial wounds on  her medial calcaneus, malleoli and into her lower leg. One on the left has surface eschar but these are far too painful for me to debridement here. She has a vascular surgery appointment next week to see if anything can be done to help here. I think she has had previous ablations several years ago at [facility]. She has no major edema. She tells me that she did not get product last time [facility]] and went for several days without it. She continues to work in work boots 12 hours a day. She cannot get compression/4-layer under her work boots. 09/20/15 no major change. Periwound edema control was not very good. Her point with pain and vascular is next Wednesday the 25th 09/28/15; the patient is seen vascular surgery and is apparently scheduled for repeat duplex ultrasounds of her bilateral lower legs next week. 10/05/15; the patient was seen by Dr. Doren Custard of vascular surgery. He feels that she should have arterial insufficiency excluded as cause/contributed to her nonhealing stage she is therefore booked for an arteriogram. She has apparently monophasic signals in the dorsalis pedis pulses. She also of course has known severe chronic venous insufficiency with previous procedures as noted previously. I had another long discussion with the patient today about her continuing to work 12 hour shifts. I've written her out for 2 months area had concerns about this as her work location is currently undergoing significant turmoil and this may lead to her termination. She is aware of this however I agree with her that she simply cannot continue to stand for 12 hours multiple days a week with the substantial wound areas she has. 10/19/15; the Dr. Doren Custard appointment was largely for an arteriogram which was normal. She does not have an arterial issue. He didn't make a comment about her chronic venous insufficiency for which she has had previous ablations. Presumably it was not felt that anything additional could  be done. The patient is now out of work as I prescribed 2 weeks ago. Her wounds look somewhat less aggravated presumably because of this. I felt I would give debridement another try today 10/25/15; no major change in this patient's wounds. We are struggling to get her product that she can afford into her own home through her insurance. 11/01/15; no major change in the patient's wounds. I have been using silver alginate as the most affordable product. I spoke to Dr. Marla Roe last week with her requested take her to the OR for surgical debridement and placement of ACEL. Dr. Marla Roe told me that she would be willing to do this however [facility] will not cover this, fortunately the patient has [place] healthcare of some variant 11/08/15; no major change in the patient's wounds. She has been completely nonviable surface that this but is in too much pain with any attempted debridement are clinic.  I have arranged for her to see Dr. Marla Roe ham of plastic surgery and this appointment is on Monday. I am hopeful that they will take her to the OR for debridement, possible ACEL ultimately possible skin graft 11/22/15 no major change in the patient's wounds over her bilateral medial calcaneus medial malleolus into the lower legs. Surface on these does not look too bad however on the left there is surrounding erythema and tenderness. This may be cellulitis or could him sleepy tinea. 11/29/15; no major changes in the patient's wounds over her bilateral medial malleolus. There is no infection here and I don't think any additional antibiotics are necessary. There is now plan to move forward. She sees Dr. Marla Roe in a week's time for preparation for operative debridement and ACEL placement I believe on 7/12. She then has a follow-up appointment with Dr. Marla Roe on 7/21 12/28/15; the patient returns today having been taken to the [facility] by Dr. Marla Roe 12/12/15 she underwent debridement,  intraoperative cultures [which were negative]. She had placement of a wound VAC. Parent really ACEL was not available to be placed. The wound VAC foam apparently adhered to the wound since then she's been using silver alginate, Xeroform under Ace wraps. She still says there is a lot of drainage and a lot of pain 01/31/16; this is a patient I see monthly. I had referred her to Dr. Marla Roe him of plastic surgery for large wounds on her bilateral medial ankles. She has been to the OR twice once in early July and once in early August. She tells me over the last 3 weeks she has been using the wound VAC with ACEL underneath it. On the right we've simply been using silver alginate. Under Kerlix Coban wraps. 02/28/16; this is a patient I'm currently seeing monthly. She is gone on to have a skin graft over her large venous insufficiency ulcer on the left medial ankle. This was done by Dr. Marla Roe him. The patient is a bit perturbed about why she didn't have one on her right medial ankle wound. She has been using silver alginate to this. 03/06/16; I received a phone call from her plastic surgery Dr. Marla Roe. She expressed some concern about the viability of the skin graft she did on the left medial ankle wound. Asked me to place Endoform on this. She told me she is not planning to do a subsequent skin graft on the right as the left one did not take very well. I had placed Hydrofera Blue on the right 03/13/16; continue to have a reasonably healthy wound on the right medial ankle. Down to 3 mm in terms of size. There is epithelialization here. The area on the left medial ankle is her skin graft site. I suppose the last week this looks somewhat better. She has an open area inferiorly however in the center there appears to be some viable tissue. There is a lot of surface callus and eschar that will eventually need to come off however none of this looked to be infected. Patient states that the is able to  keep the dressing on for several days which is an improvement. 03/20/16 no major change in the circumference of either wound however on the left side the patient was at Dr. Eusebio Friendly office and they did a debridement of left wound. 50% of the wound seems to be epithelialized. I been using Endoform on the left Hydrofera Blue in the right 03/27/16; she arrives today with her wound is not looking as healthy as  they did last week. The area on the right clearly has an adherent surface to this a very similar surface on the left. Unfortunately for this patient this is all too familiar problem. Clearly the Endoform is not working and will need to change that today that has some potential to help this surface. She does not tolerate debridement in this clinic very well. She is changing the dressing wants 04/03/16; patient arrives with the wounds looking somewhat better especially on the right. Dr. Migdalia Dk change the dressing to silver alginate when she saw her on Monday and also sold her some compression socks. The usefulness of the latter is really not clear and woman with severely draining wounds. 04/10/16; the patient is doing a bit of an experiment wearing the compression stockings that Dr. Migdalia Dk provided her to her left leg and the out of legs based dressings that we provided to the right. 05/01/16; the patient is continuing to wear compression stockings Dr. Migdalia Dk provided her on the left that are apparently silver impregnated. She has been using Iodoflex to the right leg wound. Still a moderate amount of drainage, when she leaves here the wraps only last for 4 days. She has to change the stocking on the left leg every night 05/15/16; she is now using compression stockings bilaterally provided by Dr. Marla Roe. She is wearing a nonadherent layer over the wounds so really I don't think there is anything specific being done to this now. She has some reduction on the left wound. The right is stable. I  think all healing here is being done without a specific dressing 06/09/16; patient arrives here today with not much change in the wound certainly in diameter to large circular wounds over the medial aspect of her ankle bilaterally. Under the light of these services are certainly not viable for healing. There is no evidence of surrounding infection. She is wearing compression stockings with some sort of silver impregnation as prescribed by Dr. Marla Roe. She has a follow-up with her tomorrow. 06/30/16; no major change in the size or condition of her wounds. These are still probably covered with a nonviable surface. She is using only her purchase stockings. She did see Dr. Marla Roe who seemed to want to apply Dakin's solution to this I'm not extreme short what value this would be. I would suggest Iodoflex which she still has at home. 07/28/16; I follow Mrs. Purdy episodically along with Dr. Marla Roe. 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 She has been applying a topical collagen ointment to both wounds with Adaptic. I don't think Dr. Marla Roe is planning to take her back to the OR. 08/19/16; I follow Mrs. Jeneen Rinks on a monthly basis along with Dr. Marla Roe of plastic surgery. 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 I been following her for a number of years. At one point I was able to get the right medial malleolus wound to heal and had the left medial malleolus down to about half its current size however and I had to send her to plastic surgery for an operative debridement. Since then things have been stable to slightly improve the area on the right is slightly better one in the left about the same although there is much less adherent surface than I'm used to with this patient. She is using some form of liquid collagen gel that Dr. Marla Roe provided a Kerlix  cover with the  patient's own pressure stockings. She tells me that she has extreme pain in both ankles and along the lateral aspect of both feet. She has been unable to work for some period of time. She is telling me she is retiring at the beginning of April. She sees Dr. Doran Durand of orthopedics next week 09/22/16; patient has not seen Dr. Marla Roe since the last time she is here. I'm not really sure what she is using to the wounds other than bits and pieces of think she had left over including most recently Hydrofera Blue. She is using juxtalite stockings. She is having difficulty with her husband's recent illness "stroke". She is having to transport him to various doctors appointments. Dr. Marla Roe left her the option of a repeat debridement with ACEL however she has not been able to get the time to follow-up on this. She continues to have a fair amount of drainage out of these wounds with certainly precludes leaving dressings on all week 10/13/16; patient has not seen Dr. Marla Roe since she was last in our clinic. I'm not really sure what she is doing with the wounds, we did try to get her [facility] and I think she is actually using this most of the time. Because of drainage she states she has to change this every second day although this is an improvement from what she used to do. She went to see Dr. Doran Durand who did not think she had a muscular issue with regards to her feet, he referred her to a neurologist and I think the appointment is sometime in June. I changed her back to Iodoflex which she has used in the past but not recently. 11/03/16; the patient has been using Iodoflex although she ran out of this. Still claims that there is a lot of drainage although the wound does not look like this. No surrounding erythema. She has not been back to see Dr. Marla Roe 11/24/16; the patient has been using Iodoflex again but she ran out of it 2 or 3 days ago. There is no major change in the  condition of either one of these wounds in fact they are larger and covered in a thick adherent surface slough/nonviable tissue especially on the left. She does not tolerate mechanical debridement in our clinic. Going back to see Dr. Marla Roe of plastic surgery for an operative debridement would seem reasonable. 12/15/16; the patient has not been back to see Dr. Marla Roe. She is been dealing with a series of illnesses and her husband which of monopolized her time. She is been using Sorbact which we largely supplied. She states the drainage is bad enough that it maximum she can go 2-3 days without changing the dressing 01/12/2017 -- the patient has not been back for about 4 weeks and has not seen Dr. Marla Roe not does she have any appointment pending. 01/23/17; patient has not seen Dr. Marla Roe even though I suggested this previously. She is using Santyl that was suggested last week by Dr. Con Memos this Cost her $16 through her insurance which is indeed surprising 02/12/17; continuing Santyl and the patient is changing this daily. A lot of drainage. She has not been back to see plastic surgery she is using an Ace wrap. Our intake nurse suggested wrap around stockings which would make a good reasonable alternative 02/26/17; patient is been using Santyl and changing this daily due to drainage. She has not been to see plastic surgery she uses in April Ace wrap to control the edema. She did obtain  extremitease stockings but stated that the edema in her leg was to big for these 03/20/17; patient is using Santyl and Anasept. Surfaces looked better today the area on the right is actually measuring a little smaller. She has states she has a lot of pain in her feet and ankles and is asking for a consult to pain control which I'll try to help her with through our case manager. 04/10/17; the patient arrives with better-looking wound surfaces and is slightly smaller wound on the left she is using a  combination of Santyl and Anasept. She has an appointment or at least as started in the pain control center associated with [facility] 05/14/17; this is a patient who I followed for a prolonged period of time. 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 At one point I had this down to a much smaller wound on the left however these reopened and we've never been able to get these to heal. She has been using Santyl and Anasept gel although 2 weeks ago she ran out of the Anasept gel. She has a stable appearance of the wound. She is going to the wound care clinic at [facility]. They wanted do a nerve block/spinal block although she tells me she is reluctant to go forward with that. 05/21/17; this is a patient I have followed for many years. 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 Chronic pain and deformity in her ankles as well. She is been to see plastic surgery as well as orthopedics. Using PolyMem AG most recently/Kerramax/ABDs and 2 layer compression. She has managed to keep this on and she is coming in for a nurse check to change the dressing on Tuesdays, we see her on Fridays 06/05/17; really quite a good looking surface and the area especially on the right medial has contracted in terms of dimensions. Well granulated healthy-looking tissue on both sides. Even with an open curet there is nothing that even feels abnormal here. This is as good as I've seen this in quite some time. We have been using PolyMem AG and bringing her in for a nurse check 06/12/17; really quite good surface on both of these wounds. The right medial has contracted a bit left is not. We've been using PolyMem and AG and she is coming in for a nurse visit 06/19/17; we have been using PolyMem AG and bringing her in for a nurse check. Dimensions of her wounds are not better but the surfaces looked better bilaterally. She complained of bleeding last night and the left  wound and increasing pain bilaterally. She states her wound pain is more neuropathic than just the wounds. There was some suggestion that this was radicular from her pain management doctor in talking to her it is really difficult to sort this out. 06/26/17; using PolyMem and AG and bringing her in for a nurse check as All of this and reasonably stable condition. Certainly not improved. The dimensions on the lateral part of the right leg look better but not really measuring better. The medial aspect on the left is about the same. 07/03/16; we have been using PolyMen AG and bringing her in for a nurse check to change the dressings as the wounds have drainage which precludes once weekly changing. We are using all secondary absorptive dressings.our intake nurse is brought up the idea of using a wound VAC/snap VAC on the wound to help with the drainage to see if this would result in some contraction.  This is not a bad idea. The area on the right medial is actually looking smaller. Both wounds have a reasonable-looking surface. There is no evidence of cellulitis. The edema is well controlled 07/10/17; the patient was denied for a snap VAC by her insurance. The major issue with these wounds continues to be drainage. We are using wicked PolyMem AG and she is coming in for a nurse visit to change this. The wounds are stable to slightly improved. The surface looks vibrant and the area on the right certainly has shrunk in size but very slowly 07/17/17; the patient still has large wounds on her bilateral medial malleoli. Surface of both of these wounds looks better. The dimensions seem to come and go but no consistent improvement. There is no epithelialization. We do not have options for advanced treatment products due to insurance issues. They did not approve of the wound VAC to help control the drainage. More recently we've been using PolyMem and AG wicked to allow drainage through. We have been bringing her in  for a nurse visit to change this. We do not have a lot of options for wound care products and the home again due to insurance issues 07/24/17; the patient's wound actually looks somewhat better today. No drainage measurements are smaller still healthy-looking surface. We used silver collagen under PolyMen started last week. We have been bringing her in for a dressing change 07/31/17; patient's wound surface continued to look better and I think there is visible change in the dimensions of the wound on the right. Rims of epithelialization. We have been using silver collagen under PolyMen and bringing her in for a dressing change. There appears to be less drainage although she is still in need of the dressing change 08/07/17. Patient's wound surface continues to look better on both sides and the area on the right is definitely smaller. We have been using silver collagen and PolyMen. She feels that the drainage has been it has been better. I asked her about her vascular status. She went to see Dr. Aleda Grana at [facility] and had some form of ablation. I don't have much detail on this. I haven't my notes from 2016 that she was not a candidate for any further ablation but I don't have any more information on this. We had referred her to vein and vascular I don't think she ever went. He does not have a history of PAD although I don't have any information on this either. We don't even have ABIs in our record 08/14/17; we've been using silver collagen and PolyMen cover. And putting the patient and compression. She we are bringing her in as a nurse visit to change this because ofarge amount of drainage. We didn't the ABIs in clinic today since they had been done in many moons 1.2 bilaterally. She has been to see vein and vascular however this was at [facility] and she had ablation although I really don't have any information on this all seemed biking get a report. She is also been operatively debrided by  plastic surgery and had a cell placed probably 8-12 months ago. This didn't have a major effect. We've been making some gains with current dressings 08/19/17-She is here in follow-up evaluation for bilateral medial malleoli ulcers. She continues to tolerate debridement very poorly. We will continue with recently changed topical treatment; if no significant improvement may consider switching to Iodosorb/Iodoflex. 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 She has been  using silver collagen and PolyMem. I believe she has been used and tried on Iodoflex before. 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 These have been very refractory over time. We have been using silver collagen and PolyMen. She is coming in once a week for a doctor's and once a week for nurse visits. We are actually making some progress 09/18/17; the patient's wounds are smaller especially on the right medial. She arrives today to upset to consider even washing these off with Anasept which I think is been part of the reason this is been closing. We've been using collagen covered in PolyMen otherwise. It is noted that she has a small area of folliculitis on the right medial calf that. As we are wrapping her legs I'll give her a short course of doxycycline to make sure this doesn't amount to anything. She is a long list of complaints today including imbalance, shortness of breath on exertion, inversion of her left ankle. With regards to the latter complaints she is been to see orthopedics and they offered her a tendon release surgery I believe but wanted her wounds to be closed first. I have recommended she go see her primary physician with regards to everything else. 09/25/17; patient's wounds  are about the same size. We have made some progress bilaterally although not in recent weeks. She will not allow me T wash these wounds with Anasept even if she is doing her cell. Wheeze we've been using collagen covered in PolyMen. Last week she had a small area of folliculitis this is now opened into a small wound. She completed 5 days of trimethoprim sulfamethoxazole 10/02/17; unfortunately the area on her left medial ankle is worse with a larger wound area towards the Achilles. The patient complains of a lot of pain. She will not allow debridement although visually I don't think there is anything to debridement in any case. We have been using silver collagen and PolyMen for several months now. Initially we are making some progress although I'm not really seeing that today. We will move back to [facility]. His admittedly this is a bit of a repeat however I'm hoping that his situation is different now. The patient tells me she had her leg on the left give out on her yesterday this is process some pain. 10/09/17; the patient is seen twice a week largely because of drainage issues coming out of the chronic medial bimalleolar wounds that are chronic. Last week the dimensions of the one on the left looks a little larger I changed her to [facility]. She comes in today with a history of terrible pain in the bilateral wound areas. She will not allow debridement. She will not even allow a tissue culture. There is no surrounding erythema no no evidence of cellulitis. We have been putting her Kerlix Coban man. She will not allow more aggressive compression as there was a suggestion to put her in 3 layer wraps. 10/16/17; large wounds on her bilateral medial malleoli. These are chronic. Not much change from last week. The surface looks have healthy but absolutely no epithelialization. A lot of pain little less so of drainage. She will not allow debridement or even washing these off in the vigorous  fashion with Anasept. 10/23/17; large wounds on her bilateral malleoli which are chronic. Some improvement  in terms of size perhaps on the right since last time I saw these. She states that after we increased the 3 layer compression there was some bleeding, when she came in for a nurse visit she did not want 3 layer compression put back on about our nurse managed to convince her. She has known chronic venous visit issues and I'm hoping to get her to tolerate the 3 layer compression. using Hydrofera Blue 10/30/17; absolutely no change in the condition of either wound although we've had some improvement in dimensions on the right.. Attempted to put her in 3 layer compression she didn't tolerated she is back in 2 layer compression. We've been using Hydrofera Blue We looked over her past records. She had venous reflux studies in November 2016. There was no evidence of deep venous reflux on the right. Superficial vein did not show the greater saphenous vein at think this is been previously ablated the small saphenous vein was within normal limits. The left deep venous system showed no DVT the vessels were positive for deep venous reflux in the posterior tibial veins at the ankle. The greater saphenous vein was surgically absent small saphenous vein was within normal limits. She went to vein and vascular at [facility]. I believe she had an ablation on the left greater saphenous vein. I'll update her reflux studies perhaps ever reviewed by vein and vascular. We've made absolutely no progress in these wounds. Will also try to read and TheraSkins through her insurance 11/06/17; W the patient apparently has a 2 week follow-up with vein and vascular I like him to review the whole issue with regards to her previous vascular workup by Dr. Aleda Grana. We've really made no progress on these wounds in many months. She arrives today with less viable looking surface on the left medial ankle wound. This  was apparently looking about the same on Tuesday when she was here for nurse visit. 11/13/17; deep tissue culture I did last time of the left lower leg showed multiple organisms without any predominating. In particular no Staphylococcus or group A strep were isolated. We sent her for venous reflux studies. She's had a previous left greater saphenous vein stripping and I think sclerotherapy of the right greater saphenous vein. She didn't really look at the lesser saphenous vein this both wounds are on the medial aspect. She has reflux in the common femoral vein and popliteal vein and an accessory vein on the right and the common femoral vein and popliteal vein on the left. I'm going to have her go to see vein and vascular just the look over things and see if anything else beside aggressive compression is indicated here. We have not been able to make any progress on these wounds in spite of the fact that the surface of the wounds is never look too bad. 11/20/17; no major change in the condition of the wounds. Patient reports a large amount of drainage. She has a lot of complaints of pain although enlisting her today I wonder if some of this at least his neuropathic rather than secondary to her wounds. She has an appointment with vein and vascular on 12/30/17. The refractory nature of these wounds in my mind at least need vein and vascular to look over the wounds the recent reflux studies we did and her history to see if anything further can be done here. I also note her gait is deteriorated quite a bit. 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  She has a bilateral Trendelenburg gait. I wonder if this is neuropathic or perhaps multilevel radicular. 11/27/17; her wounds actually looks slightly better. Healthy-looking granulation tissue a scant amount of epithelialization. [place] healthcare will not pay for [employer]. They will play for tri layer Oasis and Dermagraft. This is not a diabetic  ulcer. We'll try for the tri layer Oasis. She still complains of some drainage. She has a vein and vascular appointment on 12/30/17 12/04/17; the wounds visually look quite good. Healthy-looking granulation with some degree of epithelialization. We are still waiting for response to our request for trial to try layer Oasis. Her appointment with vascular to review venous and arterial issues isn't sold the end of July 7/31. Not allow debridement or even vigorous cleansing of the wound surface. 12/18/17; slightly smaller especially on the right. 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 We've been using Hydrofera Blue. We still are looking into triple layer Oasis through her insurance 01/08/18 on evaluation today patient's wound actually appears to be showing signs of good improvement at this point in time. She has been tolerating the dressing changes without complication. Fortunately there does not appear to be any evidence of infection at this point in time. We have been utilizing silver nitrate which does seem to be of benefit for her which is also good news. Overall I'm very happy with how things seem to be both regards appearance as well as measurement. Patient did see Dr. Bridgett Larsson for evaluation on 12/30/17. In his assessment he felt that stripping would not likely add much more than chronic compression to the patient's healing process. His recommendation was to follow-up in three months with Dr. Doren Custard if she hasn't healed in order to consider referral back to you and see vascular where she previously was in a trial and was able to get her wound to heal. I'll be see what she feels she when you staying compression and he reiterated this as well. 01/13/18 on evaluation today patient appears to actually be doing very well in regard to her bilateral medial malleolus ulcers. She seems to have tolerated the chemical cauterization with silver nitrate last week she did have some  pain through that evening but fortunately states that I'll be see since it seems to be doing better she is overall pleased with the progress. 01/21/18; really quite a remarkable improvement since I've last seen these wounds. We started using silver nitrate specially on the islands of hyper granulation which for some reason her around the wound circumference. This is really done quite nicely. Primary dressing Hydrofera Blue under 4 layer compression. She seems to be able to hold out without a nurse rewrap. Follow-up in 1 week 01/28/18; we've continued the hydrofera blue but continued with chemical cauterization to the wound area that we started about a month ago for irregular hyper granulation. She is made almost stunning improvement in the overall wound dimensions. I was not really expecting this degree of improvement in these chronic wounds 02/05/18; we continue with Hydrofera Bluebut of also continued the aggressive chemical cauterization with silver nitrate. We made nice progress with the right greater than left wound. 02/12/18. We continued with Hydrofera Blue after aggressive chemical cauterization with silver nitrate. We appear to be making nice progress with both wound areas 02/19/2018; we continue with [facility] after washing the wounds vigorously with Anasept spray and chemical cauterization with silver nitrate. We are making excellent progress. The area on the right's just about closed 02/26/2018. The area on the  left medial ankle had too much necrotic debris today. I used a #5 curette we are able to get most of the soft. I continued with the silver nitrate to the much smaller wound on the right medial ankle she had a new area on her right lower pretibial area which she says was due to a role in her compression 03/05/2018; both wound areas look healthy. Not much change in dimensions from last week. I continue to use silver nitrate and Hydrofera Blue. The patient saw Dr. Doren Custard of vein and  vascular. He felt she had venous stasis ulcers. He felt based on her previous arteriogram she should have adequate circulation for healing. Also she has deep venous reflux but really no significant correctable superficial venous reflux at this time. He felt we should continue with conservative management including leg elevation and compression 04/02/2018; since we last saw this woman about a month ago she had a fall apparently suffered a pelvic fracture. I did not look up the x-ray. Nevertheless because of pain she literally was bedbound for 2 weeks and had home health coming out to change the dressing. Somewhat predictably this is resulted in considerable improvement in both wound areas. The right is just about closed on the medial malleolus and the left is about half the size. 04/16/2018; both her wounds continue to go down in size. Using Hydrofera Blue. 05/07/18; both her wounds appeared to be improving especially on the right where it is almost closed. We are using Hydrofera Blue 05/14/2018; slightly worse this week with larger wounds. Surface on the left medial not quite as good. We have been using Hydrofera Blue 05/21/18; again the wounds are slightly larger. Left medial malleolus slightly larger with eschar around the circumference. We have been using Hydrofera Blue undergoing a wraps for a prolonged period of time. This got a lot better when she was more recumbent due to a fall and a back injury. I change the primary dressing the silver alginate today. She did not tolerate a 4 layer compression previously although I may need to bring this up with her next time 05/28/2018; area on the left medial malleolus again is slightly larger with more drainage. Area on the right is roughly unchanged. She has a small area of folliculitis on the right medial just on the lower calf. This does not look ominous. 06/03/2018 left medial malleolus slightly smaller in a better looking surface. We used silver  nitrate on this last time with silver alginate. The area on the right appears slightly smaller 1/10; left medial malleolus slightly smaller. Small open area on the right. We used silver nitrate and silver alginate as of 2 weeks ago. We continue with the wound and compression. These got a lot better when she was off her feet 1/17; right medial malleolus wound is smaller. The left may be slightly smaller. Both surfaces look somewhat better. 1/24; both wounds are slightly smaller. Using silver alginate under Unna boots 1/31; both wounds appear smaller in fact the area on the right medial is just about closed. Surface eschar. We have been using silver alginate under Unna boots. The patient is less active now spends let much less time on her feet and I think this is contributed to the general improvement in the wound condition 2/7; both wounds appear smaller. I was hopeful the right medial would be closed however there there is still the same small open area. Slight amount of surface eschar on the left the dimensions are smaller there is  eschar but the wound edges appear to be free. We have been using silver alginate under Unna boot's 2/14; both wounds once again measure smaller. Circumferential eschar on the left medial. We have been using silver alginate under Unna boots with gradual improvement 2/21; the area on the right medial malleolus has healed. The area on the left is smaller. We have been using silver alginate and Unna boots. We can discharge wrapping the right leg she has 20/30 stockings at home she will need to protect the scar tissue in this area 2/28; the area on the right medial malleolus remains closed the patient has a compression stocking. The area on the left is smaller. We have been using silver alginate and Unna boots. 3/6 the area on the right medial ankle remains closed. Good edema control noted she is using her own compression stocking. The area on the left medial ankle is  smaller. We have been managing this with silver alginate and Unna boots which we will continue today. 3/13; the area on the right medial ankle remains closed and I'm declaring it healed today. When necessary the left is about the same still a healthy-looking surface but no major change and wound area. No evidence of infection and using silver alginate under unna and generally making considerable improvement 3/27 the area on the right medial ankle remains closed the area on the left is about the same as last week. Certainly not any worse we have been using silver alginate under an Unna boot 4/3; the area on the right medial ankle remains closed per the patient. We did not look at this wound. The wound on the left medial ankle is about the same surface looks healthy we have been using silver alginate under an Unna boot 4/10; area on the right medial ankle remains closed per the patient. We did not look at this wound. The wound on the left medial ankle is slightly larger. The patient complains that the [facility] caused burning pain all week. She also told us that she was a lot more active this week. Changed her back to silver alginate 4/17; right medial ankle still closed per the patient. Left medial ankle is slightly larger. Using silver alginate. She did not tolerate Hydrofera Blue on this area 4/24; right medial ankle remains closed we have not look at this. The left medial ankle continues to get larger today by about a centimeter. We have been using silver alginate under Unna boots. She complains about 4 layer compression as an alternative. She has been up on her feet working on her garden 5/8; right medial ankle remains closed we did not look at this. The left medial ankle has increased in size about 100%. We have been using silver alginate under Unna boots. She noted increased pain this week and was not surprised that the wound is deteriorated 5/15; no major change in SA however much less  erythema ( one week of doxy ocellulitis). 5/22-63 year old female returns at 1 week to the clinic for left medial ankle wound for which we have been using silver alginate under 3 layer compression She was placed on DOXY at last visit - the wound is wider at this visit. She is in 3 layer compression 5/29; change to [facility] last week. I had given her empiric doxycycline 2 weeks ago for a week. She is in 3 layer compression. She complains of a lot of pain and drainage on presentation today. 6/5; using Hydrofera Blue. I gave her doxycycline recently  empirically for erythema and pain around the wound. Believe her cultures showed enterococcus which not would not have been well covered by doxycycline nevertheless the wound looks better and I don't feel specifically that the enterococcus needs to be covered. She has a new what looks like a wrap injury on her lateral left ankle. 6/12; she is using Hydrofera Blue. She has a new area on the left anterior lower tibial area. This was a wrap injury last week. 6/19; the patient is using Hydrofera Blue. She arrived with marked inflammation and erythema around the wound and tenderness. 12/01/18 on evaluation today patient appears to be doing a little bit better based on what I'm hearing from the standpoint of lassos evaluation to this as far as the overall appearance of the wound is concerned. Then sometime substandard she typically sees Dr. Dellia Nims. Nonetheless overall very pleased with the progress that she's made up to this point. No fevers, chills, nausea, or vomiting noted at this time. 7/10; some improvement in the surface area. Aggressively debrided last week apparently. I went ahead with the debridement today although the patient does not tolerate this very well. We have been using Iodoflex. Still a fair amount of drainage 7/17; slightly smaller. Using Iodoflex. 7/24; no change from last week in terms of surface area. We have been using Iodoflex.  Surface looks and continues to look somewhat better 7/31; surface area slightly smaller better looking surface. We have been using Iodoflex. This is under Unna boot compression 8/7-Patient presents at 1 week with Unna boot and Iodoflex, wound appears better 8/14-Patient presents at 1 week with Iodoflex, we use the Unna boot, wound appears to be stable better.Patient is getting Botox treatment for the inversion of the foot for tendon release, Next week 8/21; we are using Iodoflex. Unna boot. The wound is stable in terms of surface area. Under illumination there is some areas of the wound that appear to be either epithelialized or perhaps this is adherent slough at this point I was not really clear. It did not wipe off and I was reluctant to debride this today. 8/28; we are using Iodoflex in an Unna boot. Seems to be making good improvement. 9/4; using Iodoflex and wound is slightly smaller. 9/18; we are using Iodoflex with topical silver nitrate when she is here. The wound continues to be smaller 10/2; patient missed her appointment last week due to GI issues. She left and Iodoflex based dressing on for 2 weeks. Wound is about the same size about the size of a dime on the left medial lower 10/9 we have been using Iodoflex on the medial left ankle wound. She has a new superficial probable wrap injury on the dorsal left ankle Electronic Signature(s) Signed: 03/13/2019 10:27:00 AM By: Linton Ham MD Entered By: Linton Ham on 03/11/2019 11:04:16 -------------------------------------------------------------------------------- Physical Exam Details Patient Name: Date of Service: [name] 03/11/2019 10:00 AM Medical Record [id number] Patient Account Number: [id number] Date of Birth/Sex: Treating RN: Jul 28, 1955 (63 y.o. [name] Primary Care Provider: [name] Other Clinician: [name] Referring Provider: Treating Provider/Extender:Latiesha Harada,  Velva Harman, Essie Christine in Treatment: 326 Constitutional Sitting or standing Blood Pressure is within target range for patient.. Pulse regular and within target range for patient.Marland Kitchen Respirations regular, non-labored and within target range.. Temperature is normal and within the target range for the patient.. Eyes Conjunctivae clear. No discharge.no icterus. Respiratory work of breathing is normal. Cardiovascular Pedal pulses palpable. There is no edema. Psychiatric appears at normal baseline. Notes  Wound exam; left medial malleolus. I do not think there is much change in this. Under illumination there is some what looks to be epithelialization. He has a new area on the left dorsal ankle superficial skin breakdown some irritation Electronic Signature(s) Signed: 03/13/2019 10:27:00 AM By: Linton Ham MD Entered By: Linton Ham on 03/11/2019 11:06:42 -------------------------------------------------------------------------------- Physician Orders Details Patient Name: Date of Service: [name] 03/11/2019 10:00 AM Medical Record [id number] Patient Account Number: [id number] Date of Birth/Sex: Treating RN: Oct 22, 1955 (63 y.o. 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 Dressing Change Frequency Wound #11 Left,Anterior Ankle Do not change entire dressing for one week. Wound #3 Left,Medial Malleolus Do not change entire dressing for one week. Skin Barriers/Peri-Wound Care Moisturizing lotion TCA Cream or Ointment - mixed  with lotion Wound Cleansing Clean wound with Wound Cleanser - clean with anasept with dressing changes May shower with protection. Primary Wound Dressing Wound #11 Left,Anterior Ankle Hydrofera Blue Wound #3 Left,Medial Malleolus Hydrofera Blue Secondary Dressing Wound #3 Left,Medial Malleolus Dry Gauze Kerramax Wound #11 Left,Anterior Ankle Dry Gauze Edema Control Unna Boot to Left Lower Extremity - no kerlix layer Avoid standing for long periods of time Elevate legs to the level of the heart or above for 30 minutes daily and/or when sitting, a frequency of: Support Garment 20-30 mm/Hg pressure to: - compression stocking right leg daily Electronic Signature(s) Signed: 03/13/2019 10:27:00 AM By: Linton Ham MD Signed: 03/15/2019 5:52:09 PM By: Levan Hurst RN, BSN Entered By: Levan Hurst on 03/11/2019 10:30:41 -------------------------------------------------------------------------------- Problem List Details Patient Name: Date of Service: [name] 03/11/2019 10:00 AM Medical Record [id number] Patient Account Number: [id number] Date of Birth/Sex: Treating RN: Sep 15, 1955 (63 y.o. 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 11/16/2014 No Yes of calf and inflammation L97.321 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 03/11/2019 No Yes breakdown of skin Inactive Problems ICD-10 Code Description Active Date Inactive Date I83.223 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 08/03/2014 08/03/2014 and inflammation L03.116 Cellulitis of left lower  limb 09/07/2014 09/07/2014 Resolved Problems ICD-10 Code Description Active Date Resolved Date L97.312 Non-pressure chronic ulcer of right ankle with fat layer 04/10/2016 04/10/2016 exposed Electronic Signature(s) Signed: 03/13/2019 10:27:00 AM By: Linton Ham MD Entered By: Linton Ham on 03/11/2019 11:01:15 -------------------------------------------------------------------------------- Progress Note Details Patient Name: Date of Service: [name] 03/11/2019 10:00 AM Medical Record [id number] Patient Account Number: [id number] Date of Birth/Sex: Treating RN: 08-22-1955 (63 y.o. [name] Primary Care Provider: [name] Other Clinician: [name] Referring Provider: Treating Provider/Extender:Ashwath Lasch, Tawny Asal in Treatment: 326 Subjective History of Present Illness (HPI) the remaining wound is over the left medial ankle. Similar wound over the right medial ankle healed largely with use of Apligraf. Most recently we have been using Hydrofera Blue over this wound with considerable improvement. The patient has  been extensively worked up in the past for her venous insufficiency and she is not a candidate for antireflux surgery although I have none of the details available currently. 08/24/14; considerable improvement today. About 50% of this wound areas now epithelialized. The base of the wound appears to be healthier granulation.as opposed to last week when she had deteriorated a considerable improvement 08/17/14; unfortunately the wound has regressed somewhat. The areas of epithelialization from the superior aspect are not nearly as healthy as they were last week. The patient thinks her Hydrofera Blue slipped. 09/07/14; unfortunately the area has markedly regressed in the 2 weeks since I've seen this. There is an odor surrounding erythema. The healthy granulation tissue that we had at the base of the wound now is a dusky color. The  nurse reports green drainage 09/14/14; the area looks somewhat better than last week. There is less erythema and less drainage. The culture I did did not show any growth. Nevertheless I think it is better to continue the Cipro and doxycycline for a further week. The remaining wound area was debridement. 09/21/14. Wound did not require debridement last week. Still less erythema and less drainage. She can complete her antibiotics. The areas of epithelialization in the superior aspect of the wound do not look as healthy as they did some weeks ago 10/05/14 continued improvement in the condition of this wound. There is advancing epithelialization. Less aggressive debridement required 10/19/14 continued improvement in the condition and volume of this wound. Less aggressive debridement to the inferior part of this to remove surface slough and fibrinous eschar 11/02/14 no debridement is required. The surface granulation appears healthy although some of her islands of epithelialization seem to have regressed. No evidence of infection 11/16/14; lites surface debridement done of surface eschar. The wound does not look to be unhealthy. No evidence of infection. Unfortunately the patient has had podiatry issues in the right foot and for some reason has redeveloped small surface ulcerations in the medial right ankle. Her original presentation involved wounds in this area 11/23/14 no debridement. The area on the right ankle has enlarged. The left ankle wound appears stable in terms of the surface although there is periwound inflammation. There has been regression in the amount of new skin 11/30/14 no debridement. Both wound areas appear healthy. There was no evidence of infection. The the new area on the right medial ankle has enlarged although that both the surfaces appear to be stable. 12/07/14; Debridement of the right medial ankle wound. No no debridement was done on the left. 12/14/14 no major change in and now  bilateral medial ankle wounds. Both of these are very painful but the no overt evidence of infection. She has had previous venous ablation 12/21/14; patient states that her right medial ankle wound is considerably more painful last week than usual. Her left is also somewhat painful. She could not tolerate debridement. The right medial ankle wound has fibrinous surface eschar 12/28/14 this is a patient with severe bilateral venous insufficiency ulcers. For a considerable period of time we actually had the one on the right medial ankle healed however this recently opened up again in June. The left medial ankle wound has been a refractory area with some absent flows. We had some success with Hydrofera Blue on this area and it literally closed by 50% however it is recently opened up Foley. Both of these were debridement today of surface eschar. She tolerates this poorly 01/25/15: No change in the status of this. Thick adherent  escar. Very poor tolerance of any attempt at debridement. I had healed the right medial malleolus wound for a considerable amount of time and had the left one down to about 50% of the volume although this is totally regressed over the last 48 weeks. Further the right leg has reopened. she is trying to make a appointment with pain and vascular, previous ablations with Dr. Aleda Grana. I do not believe there is an arterial insufficiency issue here 02/01/15 the status of the adherent eschar bilaterally is actually improved. No debridement was done. She did not manage to get vascular studies done 02/08/15 continued debridement of the area was done today. The slough is less adherent and comes off with less pressure. There is no surrounding infection peripheral pulses are intact 02/15/15 selective debridement with a disposable curette. Again the slough is less adherent and comes off with less difficulty. No surrounding infection peripheral pulses are intact. 02/22/15 selective debridement  of the right medial ankle wound. Slough comes off with less difficulty. No obvious surrounding infection peripheral pulses are intact I did not debridement the one on the left. Both of these are stable to improved 03/01/15 selective debridement of both wound areas using a curette to. Adherent slough cup soft with less difficulty. No obvious surrounding infection. The patient tells me that 2 days ago she noted a rash above the right leg wrap. She did not have this on her lower legs when she change this over she arrives with widespread left greater than right almost folliculitis-looking rash which is extremely pruritic. I don't see anything to culture here. There is no rash on the rest of her body. She feels well systemically. 03/08/15; selective debridement of both wounds using a curette. Base of this does not look unhealthy. She had limegreen drainage coming out of the left leg wound and describes a lot of drainage. The rash on her left leg looks improved to. No cultures were done. 03/22/15; patient was not here last week. Basal wounds does not look healthy and there is no surrounding erythema. No drainage. There is still a rash on the left leg that almost looks vasculitic however it is clearly limited to the top of where the wrap would be. 04/05/15; on the right required a surgical debridement of surface eschar and necrotic subcutaneous tissue. I did not debridement the area on the left. These continue to be large open wounds that are not changing that much. We were successful at one point in healing the area on the right, and at the same time the area on the left was roughly half the size of current measurements. I think a lot of the deterioration has to do with the prolonged time the patient is on her feet at work 04/19/15 I attempted-like surface debridement bilaterally she does not tolerate this. She tells me that she was in allergic care yesterday with extreme pain over her left lateral  malleolus/ankle and was told that she has an "sprain" 05/03/15; large bilateral venous insufficiency wounds over the medial malleolus/medial aspect of her ankles. She complains of copious amounts of drainage and his usual large amounts of pain. There is some increasing erythema around the wound on the right extending into the medial aspect of her foot to. historically she came in with these wounds the right one healed and the left one came down to roughly half its current size however the right one is reopened and the left is expanded. 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 05/10/15 large bilateral venous insufficiency wounds. There is less adherence surface left however the surface culture that I did last week grew pseudomonas therefore bilateral selective debridement score necessary. There is surrounding erythema. The patient describes severe bilateral drainage and a lot of pain in the left ankle. Apparently her podiatrist was were ready to do a cortisone shot 05/17/15; the patient complains of pain and again copious amounts of drainage. 05/24/15; we used Iodo flex last week. Patient notes considerable improvement in wound drainage. Only needed to change this once. 05/31/15; we continued Iodoflex; the base of these large wounds bilaterally is not too bad but there is probably likely a significant bioburden here. I would like to debridement just doesn't tolerate it. 06/06/14 I would like to continue the Iodoflex although she still hasn't managed to obtain supplies. She has bilateral medial malleoli or large wounds which are mostly superficial. Both of them are covered circumferentially with some nonviable fibrinous slough although she tolerates debridement very poorly. She apparently has an appointment for an ablation on the right leg by interventional radiology. 06/14/15; the patient arrives with the wounds and static condition. We attempted a  debridement although she does not do well with this secondary to pain. I 07/05/15; wounds are not much smaller however there appears to be a cleaner granulating base. The left has tight fibrinous slough greater than the right. Debridement is tolerated poorly due to pain. Iodoflex is done more for these wounds in any of the multitude of different dressings I have tried on the left 1 and then subsequently the right. 07/12/15; no change in the condition of this wound. I am able to do an aggressive debridement on the right but not the left. She simply cannot tolerate it. We have been using Iodoflex which helps somewhat. It is worthwhile remembering that at one point we healed the right medial ankle wound and the left was about 25% of the current circumference. We have suggested returning to vascular surgery for review of possible further ablations for one reason or another she has not been able to do this. 07/26/15 no major change in the condition of either wound on her medial ankle. I did not attempt to debridement of these. She has been aggressively scrubbing these while she is in the shower at home. She has her supply of Iodoflex which seems to have done more for these wounds then anything I have put on recently. 08/09/15 wound area appears larger although not verified by measurements. Using Iodoflex 09/05/2015 -- she was here for avisit today but had significant problems with the wound and I was asked to see her for a physician opinion. I have summarize that this lady has had surgery on her left lower extremity about 10 years ago where the possible veins stripping was done. She has had an opinion from interventional radiology around November 2016 where no further sclerotherapy was ordered. The patient works 12 hours a day and stands on a concrete floor with work boots and is unable to get the proper compression she requires and cannot elevate her limbs appropriately at any given time. She has recently  grown Pseudomonas from her wound culture but has not started her ciprofloxacin which was called in for her. 09/13/15 this continues to be a difficult situation for this patient. At one point I had this wound down to a 1.5 x 1.5" wound on her left leg. This is deteriorated and the right  leg has reopened. She now has substantial wounds on her medial calcaneus, malleoli and into her lower leg. One on the left has surface eschar but these are far too painful for me to debridement here. She has a vascular surgery appointment next week to see if anything can be done to help here. I think she has had previous ablations several years ago at [facility]. She has no major edema. She tells me that she did not get product last time [facility] and went for several days without it. She continues to work in work boots 12 hours a day. She cannot get compression/4-layer under her work boots. 09/20/15 no major change. Periwound edema control was not very good. Her point with pain and vascular is next Wednesday the 25th 09/28/15; the patient is seen vascular surgery and is apparently scheduled for repeat duplex ultrasounds of her bilateral lower legs next week. 10/05/15; the patient was seen by Dr. Doren Custard of vascular surgery. He feels that she should have arterial insufficiency excluded as cause/contributed to her nonhealing stage she is therefore booked for an arteriogram. She has apparently monophasic signals in the dorsalis pedis pulses. She also of course has known severe chronic venous insufficiency with previous procedures as noted previously. I had another long discussion with the patient today about her continuing to work 12 hour shifts. I've written her out for 2 months area had concerns about this as her work location is currently undergoing significant turmoil and this may lead to her termination. She is aware of this however I agree with her that she simply cannot continue to stand for 12 hours multiple  days a week with the substantial wound areas she has. 10/19/15; the Dr. Doren Custard appointment was largely for an arteriogram which was normal. She does not have an arterial issue. He didn't make a comment about her chronic venous insufficiency for which she has had previous ablations. Presumably it was not felt that anything additional could be done. The patient is now out of work as I prescribed 2 weeks ago. Her wounds look somewhat less aggravated presumably because of this. I felt I would give debridement another try today 10/25/15; no major change in this patient's wounds. We are struggling to get her product that she can afford into her own home through her insurance. 11/01/15; no major change in the patient's wounds. I have been using silver alginate as the most affordable product. I spoke to Dr. Marla Roe last week with her requested take her to the OR for surgical debridement and placement of ACEL. Dr. Marla Roe told me that she would be willing to do this however [facility] will not cover this, fortunately the patient has [place] healthcare of some variant 11/08/15; no major change in the patient's wounds. She has been completely nonviable surface that this but is in too much pain with any attempted debridement are clinic. I have arranged for her to see Dr. Marla Roe ham of plastic surgery and this appointment is on Monday. I am hopeful that they will take her to the OR for debridement, possible ACEL ultimately possible skin graft 11/22/15 no major change in the patient's wounds over her bilateral medial calcaneus medial malleolus into the lower legs. Surface on these does not look too bad however on the left there is surrounding erythema and tenderness. This may be cellulitis or could him sleepy tinea. 11/29/15; no major changes in the patient's wounds over her bilateral medial malleolus. There is no infection here and I  don't think any additional antibiotics are necessary. There is  now plan to move forward. She sees Dr. Marla Roe in a week's time for preparation for operative debridement and ACEL placement I believe on 7/12. She then has a follow-up appointment with Dr. Marla Roe on 7/21 12/28/15; the patient returns today having been taken to the [facility] by Dr. Marla Roe 12/12/15 she underwent debridement, intraoperative cultures [which were negative]. She had placement of a wound VAC. Parent really ACEL was not available to be placed. The wound VAC foam apparently adhered to the wound since then she's been using silver alginate, Xeroform under Ace wraps. She still says there is a lot of drainage and a lot of pain 01/31/16; this is a patient I see monthly. I had referred her to Dr. Marla Roe him of plastic surgery for large wounds on her bilateral medial ankles. She has been to the OR twice once in early July and once in early August. She tells me over the last 3 weeks she has been using the wound VAC with ACEL underneath it. On the right we've simply been using silver alginate. Under Kerlix Coban wraps. 02/28/16; this is a patient I'm currently seeing monthly. She is gone on to have a skin graft over her large venous insufficiency ulcer on the left medial ankle. This was done by Dr. Marla Roe him. The patient is a bit perturbed about why she didn't have one on her right medial ankle wound. She has been using silver alginate to this. 03/06/16; I received a phone call from her plastic surgery Dr. Marla Roe. She expressed some concern about the viability of the skin graft she did on the left medial ankle wound. Asked me to place Endoform on this. She told me she is not planning to do a subsequent skin graft on the right as the left one did not take very well. I had placed Hydrofera Blue on the right 03/13/16; continue to have a reasonably healthy wound on the right medial ankle. Down to 3 mm in terms of size. There is epithelialization here. The area on the left medial ankle is  her skin graft site. I suppose the last week this looks somewhat better. She has an open area inferiorly however in the center there appears to be some viable tissue. There is a lot of surface callus and eschar that will eventually need to come off however none of this looked to be infected. Patient states that the is able to keep the dressing on for several days which is an improvement. 03/20/16 no major change in the circumference of either wound however on the left side the patient was at Dr. Eusebio Friendly office and they did a debridement of left wound. 50% of the wound seems to be epithelialized. I been using Endoform on the left Hydrofera Blue in the right 03/27/16; she arrives today with her wound is not looking as healthy as they did last week. The area on the right clearly has an adherent surface to this a very similar surface on the left. Unfortunately for this patient this is all too familiar problem. Clearly the Endoform is not working and will need to change that today that has some potential to help this surface. She does not tolerate debridement in this clinic very well. She is changing the dressing wants 04/03/16; patient arrives with the wounds looking somewhat better especially on the right. Dr. Migdalia Dk change the dressing to silver alginate when she saw her on Monday and also sold her some compression socks.  The usefulness of the latter is really not clear and woman with severely draining wounds. 04/10/16; the patient is doing a bit of an experiment wearing the compression stockings that Dr. Migdalia Dk provided her to her left leg and the out of legs based dressings that we provided to the right. 05/01/16; the patient is continuing to wear compression stockings Dr. Migdalia Dk provided her on the left that are apparently silver impregnated. She has been using Iodoflex to the right leg wound. Still a moderate amount of drainage, when she leaves here the wraps only last for 4 days. She has to  change the stocking on the left leg every night 05/15/16; she is now using compression stockings bilaterally provided by Dr. Marla Roe. She is wearing a nonadherent layer over the wounds so really I don't think there is anything specific being done to this now. She has some reduction on the left wound. The right is stable. I think all healing here is being done without a specific dressing 06/09/16; patient arrives here today with not much change in the wound certainly in diameter to large circular wounds over the medial aspect of her ankle bilaterally. Under the light of these services are certainly not viable for healing. There is no evidence of surrounding infection. She is wearing compression stockings with some sort of silver impregnation as prescribed by Dr. Marla Roe. She has a follow-up with her tomorrow. 06/30/16; no major change in the size or condition of her wounds. These are still probably covered with a nonviable surface. She is using only her purchase stockings. She did see Dr. Marla Roe who seemed to want to apply Dakin's solution to this I'm not extreme short what value this would be. I would suggest Iodoflex which she still has at home. 07/28/16; I follow Mrs. Hainer episodically along with Dr. Marla Roe. 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 She has been applying a topical collagen ointment to both wounds with Adaptic. I don't think Dr. Marla Roe is planning to take her back to the OR. 08/19/16; I follow Mrs. Jeneen Rinks on a monthly basis along with Dr. Marla Roe of plastic surgery. 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 I been following her for a number of years. At one point I was able to get the right medial malleolus wound to heal and had the left medial malleolus down to about half its current size however and I had to send her to plastic  surgery for an operative debridement. Since then things have been stable to slightly improve the area on the right is slightly better one in the left about the same although there is much less adherent surface than I'm used to with this patient. She is using some form of liquid collagen gel that Dr. Marla Roe provided a Kerlix cover with the patient's own pressure stockings. She tells me that she has extreme pain in both ankles and along the lateral aspect of both feet. She has been unable to work for some period of time. She is telling me she is retiring at the beginning of April. She sees Dr. Doran Durand of orthopedics next week 09/22/16; patient has not seen Dr. Marla Roe since the last time she is here. I'm not really sure what she is using to the wounds other than bits and pieces of think she had left over including most recently Hydrofera Blue. She is using juxtalite stockings. She is having difficulty  with her husband's recent illness "stroke". She is having to transport him to various doctors appointments. Dr. Marla Roe left her the option of a repeat debridement with ACEL however she has not been able to get the time to follow-up on this. She continues to have a fair amount of drainage out of these wounds with certainly precludes leaving dressings on all week 10/13/16; patient has not seen Dr. Marla Roe since she was last in our clinic. I'm not really sure what she is doing with the wounds, we did try to get her [facility] and I think she is actually using this most of the time. Because of drainage she states she has to change this every second day although this is an improvement from what she used to do. She went to see Dr. Doran Durand who did not think she had a muscular issue with regards to her feet, he referred her to a neurologist and I think the appointment is sometime in June. I changed her back to Iodoflex which she has used in the past but not recently. 11/03/16; the patient has been  using Iodoflex although she ran out of this. Still claims that there is a lot of drainage although the wound does not look like this. No surrounding erythema. She has not been back to see Dr. Marla Roe 11/24/16; the patient has been using Iodoflex again but she ran out of it 2 or 3 days ago. There is no major change in the condition of either one of these wounds in fact they are larger and covered in a thick adherent surface slough/nonviable tissue especially on the left. She does not tolerate mechanical debridement in our clinic. Going back to see Dr. Marla Roe of plastic surgery for an operative debridement would seem reasonable. 12/15/16; the patient has not been back to see Dr. Marla Roe. She is been dealing with a series of illnesses and her husband which of monopolized her time. She is been using Sorbact which we largely supplied. She states the drainage is bad enough that it maximum she can go 2-3 days without changing the dressing 01/12/2017 -- the patient has not been back for about 4 weeks and has not seen Dr. Marla Roe not does she have any appointment pending. 01/23/17; patient has not seen Dr. Marla Roe even though I suggested this previously. She is using Santyl that was suggested last week by Dr. Con Memos this Cost her $16 through her insurance which is indeed surprising 02/12/17; continuing Santyl and the patient is changing this daily. A lot of drainage. She has not been back to see plastic surgery she is using an Ace wrap. Our intake nurse suggested wrap around stockings which would make a good reasonable alternative 02/26/17; patient is been using Santyl and changing this daily due to drainage. She has not been to see plastic surgery she uses in April Ace wrap to control the edema. She did obtain extremitease stockings but stated that the edema in her leg was to big for these 03/20/17; patient is using Santyl and Anasept. Surfaces looked better today the area on the right is  actually measuring a little smaller. She has states she has a lot of pain in her feet and ankles and is asking for a consult to pain control which I'll try to help her with through our case manager. 04/10/17; the patient arrives with better-looking wound surfaces and is slightly smaller wound on the left she is using a combination of Santyl and Anasept. She has an appointment or at least  as started in the pain control center associated with [facility] 05/14/17; this is a patient who I followed for a prolonged period of time. 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 At one point I had this down to a much smaller wound on the left however these reopened and we've never been able to get these to heal. She has been using Santyl and Anasept gel although 2 weeks ago she ran out of the Anasept gel. She has a stable appearance of the wound. She is going to the wound care clinic at [facility]. They wanted do a nerve block/spinal block although she tells me she is reluctant to go forward with that. 05/21/17; this is a patient I have followed for many years. 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 Chronic pain and deformity in her ankles as well. She is been to see plastic surgery as well as orthopedics. Using PolyMem AG most recently/Kerramax/ABDs and 2 layer compression. She has managed to keep this on and she is coming in for a nurse check to change the dressing on Tuesdays, we see her on Fridays 06/05/17; really quite a good looking surface and the area especially on the right medial has contracted in terms of dimensions. Well granulated healthy-looking tissue on both sides. Even with an open curet there is nothing that even feels abnormal here. This is as good as I've seen this in quite some time. We have been using PolyMem AG and bringing her in for a nurse check 06/12/17; really quite good surface on both of these wounds. The right  medial has contracted a bit left is not. We've been using PolyMem and AG and she is coming in for a nurse visit 06/19/17; we have been using PolyMem AG and bringing her in for a nurse check. Dimensions of her wounds are not better but the surfaces looked better bilaterally. She complained of bleeding last night and the left wound and increasing pain bilaterally. She states her wound pain is more neuropathic than just the wounds. There was some suggestion that this was radicular from her pain management doctor in talking to her it is really difficult to sort this out. 06/26/17; using PolyMem and AG and bringing her in for a nurse check as All of this and reasonably stable condition. Certainly not improved. The dimensions on the lateral part of the right leg look better but not really measuring better. The medial aspect on the left is about the same. 07/03/16; we have been using PolyMen AG and bringing her in for a nurse check to change the dressings as the wounds have drainage which precludes once weekly changing. We are using all secondary absorptive dressings.our intake nurse is brought up the idea of using a wound VAC/snap VAC on the wound to help with the drainage to see if this would result in some contraction. This is not a bad idea. The area on the right medial is actually looking smaller. Both wounds have a reasonable-looking surface. There is no evidence of cellulitis. The edema is well controlled 07/10/17; the patient was denied for a snap VAC by her insurance. The major issue with these wounds continues to be drainage. We are using wicked PolyMem AG and she is coming in for a nurse visit to change this. The wounds are stable to slightly improved. The surface looks vibrant and the area on the right certainly has shrunk in size but very slowly 07/17/17; the patient still has large  wounds on her bilateral medial malleoli. Surface of both of these wounds looks better. The dimensions seem to come  and go but no consistent improvement. There is no epithelialization. We do not have options for advanced treatment products due to insurance issues. They did not approve of the wound VAC to help control the drainage. More recently we've been using PolyMem and AG wicked to allow drainage through. We have been bringing her in for a nurse visit to change this. We do not have a lot of options for wound care products and the home again due to insurance issues 07/24/17; the patient's wound actually looks somewhat better today. No drainage measurements are smaller still healthy-looking surface. We used silver collagen under PolyMen started last week. We have been bringing her in for a dressing change 07/31/17; patient's wound surface continued to look better and I think there is visible change in the dimensions of the wound on the right. Rims of epithelialization. We have been using silver collagen under PolyMen and bringing her in for a dressing change. There appears to be less drainage although she is still in need of the dressing change 08/07/17. Patient's wound surface continues to look better on both sides and the area on the right is definitely smaller. We have been using silver collagen and PolyMen. She feels that the drainage has been it has been better. I asked her about her vascular status. She went to see Dr. Aleda Grana at [facility] and had some form of ablation. I don't have much detail on this. I haven't my notes from 2016 that she was not a candidate for any further ablation but I don't have any more information on this. We had referred her to vein and vascular I don't think she ever went. He does not have a history of PAD although I don't have any information on this either. We don't even have ABIs in our record 08/14/17; we've been using silver collagen and PolyMen cover. And putting the patient and compression. She we are bringing her in as a nurse visit to change this because ofarge  amount of drainage. We didn't the ABIs in clinic today since they had been done in many moons 1.2 bilaterally. She has been to see vein and vascular however this was at [facility] and she had ablation although I really don't have any information on this all seemed biking get a report. She is also been operatively debrided by plastic surgery and had a cell placed probably 8-12 months ago. This didn't have a major effect. We've been making some gains with current dressings 08/19/17-She is here in follow-up evaluation for bilateral medial malleoli ulcers. She continues to tolerate debridement very poorly. We will continue with recently changed topical treatment; if no significant improvement may consider switching to Iodosorb/Iodoflex. 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 She has been using silver collagen and PolyMem. I believe she has been used and tried on Iodoflex before. 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 These have been very refractory over time. We have been using silver collagen and PolyMen. She is coming in once a week for a doctor's and once a week for nurse visits. We are actually making some progress 09/18/17; the patient's wounds are smaller especially on the right medial.  She arrives today to upset to consider even washing these off with Anasept which I think is been part of the reason this is been closing. We've been using collagen covered in PolyMen otherwise. It is noted that she has a small area of folliculitis on the right medial calf that. As we are wrapping her legs I'll give her a short course of doxycycline to make sure this doesn't amount to anything. She is a long list of complaints today including imbalance, shortness of  breath on exertion, inversion of her left ankle. With regards to the latter complaints she is been to see orthopedics and they offered her a tendon release surgery I believe but wanted her wounds to be closed first. I have recommended she go see her primary physician with regards to everything else. 09/25/17; patient's wounds are about the same size. We have made some progress bilaterally although not in recent weeks. She will not allow me T wash these wounds with Anasept even if she is doing her cell. Wheeze we've been using collagen covered in PolyMen. Last week she had a small area of folliculitis this is now opened into a small wound. She completed 5 days of trimethoprim sulfamethoxazole 10/02/17; unfortunately the area on her left medial ankle is worse with a larger wound area towards the Achilles. The patient complains of a lot of pain. She will not allow debridement although visually I don't think there is anything to debridement in any case. We have been using silver collagen and PolyMen for several months now. Initially we are making some progress although I'm not really seeing that today. We will move back to [facility]. His admittedly this is a bit of a repeat however I'm hoping that his situation is different now. The patient tells me she had her leg on the left give out on her yesterday this is process some pain. 10/09/17; the patient is seen twice a week largely because of drainage issues coming out of the chronic medial bimalleolar wounds that are chronic. Last week the dimensions of the one on the left looks a little larger I changed her to [facility]. She comes in today with a history of terrible pain in the bilateral wound areas. She will not allow debridement. She will not even allow a tissue culture. There is no surrounding erythema no no evidence of cellulitis. We have been putting her Kerlix Coban man. She will not allow more aggressive compression as there was a  suggestion to put her in 3 layer wraps. 10/16/17; large wounds on her bilateral medial malleoli. These are chronic. Not much change from last week. The surface looks have healthy but absolutely no epithelialization. A lot of pain little less so of drainage. She will not allow debridement or even washing these off in the vigorous fashion with Anasept. 10/23/17; large wounds on her bilateral malleoli which are chronic. Some improvement in terms of size perhaps on the right since last time I saw these. She states that after we increased the 3 layer compression there was some bleeding, when she came in for a nurse visit she did not want 3 layer compression put back on about our nurse managed to convince her. She has known chronic venous visit issues and I'm hoping to get her to tolerate the 3 layer compression. using Hydrofera Blue 10/30/17; absolutely no change in the condition of either wound although we've had some improvement in dimensions on the right.. Attempted to put her in 3 layer compression she  didn't tolerated she is back in 2 layer compression. We've been using Hydrofera Blue We looked over her past records. She had venous reflux studies in November 2016. There was no evidence of deep venous reflux on the right. Superficial vein did not show the greater saphenous vein at think this is been previously ablated the small saphenous vein was within normal limits. The left deep venous system showed no DVT the vessels were positive for deep venous reflux in the posterior tibial veins at the ankle. The greater saphenous vein was surgically absent small saphenous vein was within normal limits. She went to vein and vascular at [facility]. I believe she had an ablation on the left greater saphenous vein. I'll update her reflux studies perhaps ever reviewed by vein and vascular. We've made absolutely no progress in these wounds. Will also try to read and TheraSkins through her insurance 11/06/17; W  the patient apparently has a 2 week follow-up with vein and vascular I like him to review the whole issue with regards to her previous vascular workup by Dr. Aleda Grana. We've really made no progress on these wounds in many months. She arrives today with less viable looking surface on the left medial ankle wound. This was apparently looking about the same on Tuesday when she was here for nurse visit. 11/13/17; deep tissue culture I did last time of the left lower leg showed multiple organisms without any predominating. In particular no Staphylococcus or group A strep were isolated. We sent her for venous reflux studies. She's had a previous left greater saphenous vein stripping and I think sclerotherapy of the right greater saphenous vein. She didn't really look at the lesser saphenous vein this both wounds are on the medial aspect. She has reflux in the common femoral vein and popliteal vein and an accessory vein on the right and the common femoral vein and popliteal vein on the left. I'm going to have her go to see vein and vascular just the look over things and see if anything else beside aggressive compression is indicated here. We have not been able to make any progress on these wounds in spite of the fact that the surface of the wounds is never look too bad. 11/20/17; no major change in the condition of the wounds. Patient reports a large amount of drainage. She has a lot of complaints of pain although enlisting her today I wonder if some of this at least his neuropathic rather than secondary to her wounds. She has an appointment with vein and vascular on 12/30/17. The refractory nature of these wounds in my mind at least need vein and vascular to look over the wounds the recent reflux studies we did and her history to see if anything further can be done here. I also note her gait is deteriorated quite a bit. 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 She has a bilateral  Trendelenburg gait. I wonder if this is neuropathic or perhaps multilevel radicular. 11/27/17; her wounds actually looks slightly better. Healthy-looking granulation tissue a scant amount of epithelialization. [place] healthcare will not pay for [employer]. They will play for tri layer Oasis and Dermagraft. This is not a diabetic ulcer. We'll try for the tri layer Oasis. She still complains of some drainage. She has a vein and vascular appointment on 12/30/17 12/04/17; the wounds visually look quite good. Healthy-looking granulation with some degree of epithelialization. We are still waiting for response to our request for trial to try layer  Oasis. Her appointment with vascular to review venous and arterial issues isn't sold the end of July 7/31. Not allow debridement or even vigorous cleansing of the wound surface. 12/18/17; slightly smaller especially on the right. 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 We've been using Hydrofera Blue. We still are looking into triple [facility] [employer] through her insurance 01/08/18 on evaluation today patient's wound actually appears to be showing signs of good improvement at this point in time. She has been tolerating the dressing changes without complication. Fortunately there does not appear to be any evidence of infection at this point in time. We have been utilizing silver nitrate which does seem to be of benefit for her which is also good news. Overall I'm very happy with how things seem to be both regards appearance as well as measurement. Patient did see Dr. Bridgett Larsson for evaluation on 12/30/17. In his assessment he felt that stripping would not likely add much more than chronic compression to the patient's healing process. His recommendation was to follow-up in three months with Dr. Doren Custard if she hasn't healed in order to consider referral back to you and see vascular where she previously was in a trial and was able to get her wound to heal.  I'll be see what she feels she when you staying compression and he reiterated this as well. 01/13/18 on evaluation today patient appears to actually be doing very well in regard to her bilateral medial malleolus ulcers. She seems to have tolerated the chemical cauterization with silver nitrate last week she did have some pain through that evening but fortunately states that I'll be see since it seems to be doing better she is overall pleased with the progress. 01/21/18; really quite a remarkable improvement since I've last seen these wounds. We started using silver nitrate specially on the islands of hyper granulation which for some reason her around the wound circumference. This is really done quite nicely. Primary dressing Hydrofera Blue under 4 layer compression. She seems to be able to hold out without a nurse rewrap. Follow-up in 1 week 01/28/18; we've continued the hydrofera blue but continued with chemical cauterization to the wound area that we started about a month ago for irregular hyper granulation. She is made almost stunning improvement in the overall wound dimensions. I was not really expecting this degree of improvement in these chronic wounds 02/05/18; we continue with Hydrofera Bluebut of also continued the aggressive chemical cauterization with silver nitrate. We made nice progress with the right greater than left wound. 02/12/18. We continued with Hydrofera Blue after aggressive chemical cauterization with silver nitrate. We appear to be making nice progress with both wound areas 02/19/2018; we [facility] with [facility] after washing the wounds vigorously with Anasept spray and chemical cauterization with silver nitrate. We are making excellent progress. The area on the right's just about closed 02/26/2018. The area on the left medial ankle had too much necrotic debris today. I used a #5 curette we are able to get most of the soft. I continued with the silver nitrate to the much  smaller wound on the right medial ankle she had a new area on her right lower pretibial area which she says was due to a role in her compression 03/05/2018; both wound areas look healthy. Not much change in dimensions from last week. I continue to use silver nitrate and Hydrofera Blue. The patient saw Dr. Doren Custard of vein and vascular. He felt she had venous stasis ulcers. He felt based  on her previous arteriogram she should have adequate circulation for healing. Also she has deep venous reflux but really no significant correctable superficial venous reflux at this time. He felt we should continue with conservative management including leg elevation and compression 04/02/2018; since we last saw this woman about a month ago she had a fall apparently suffered a pelvic fracture. I did not look up the x-ray. Nevertheless because of pain she literally was bedbound for 2 weeks and had home health coming out to change the dressing. Somewhat predictably this is resulted in considerable improvement in both wound areas. The right is just about closed on the medial malleolus and the left is about half the size. 04/16/2018; both her wounds continue to go down in size. Using Hydrofera Blue. 05/07/18; both her wounds appeared to be improving especially on the right where it is almost closed. We are using Hydrofera Blue 05/14/2018; slightly worse this week with larger wounds. Surface on the left medial not quite as good. We have been using Hydrofera Blue 05/21/18; again the wounds are slightly larger. Left medial malleolus slightly larger with eschar around the circumference. We have been using Hydrofera Blue undergoing a wraps for a prolonged period of time. This got a lot better when she was more recumbent due to a fall and a back injury. I change the primary dressing the silver alginate today. She did not tolerate a 4 layer compression previously although I may need to bring this up with her next  time 05/28/2018; area on the left medial malleolus again is slightly larger with more drainage. Area on the right is roughly unchanged. She has a small area of folliculitis on the right medial just on the lower calf. This does not look ominous. 06/03/2018 left medial malleolus slightly smaller in a better looking surface. We used silver nitrate on this last time with silver alginate. The area on the right appears slightly smaller 1/10; left medial malleolus slightly smaller. Small open area on the right. We used silver nitrate and silver alginate as of 2 weeks ago. We continue with the wound and compression. These got a lot better when she was off her feet 1/17; right medial malleolus wound is smaller. The left may be slightly smaller. Both surfaces look somewhat better. 1/24; both wounds are slightly smaller. Using silver alginate under Unna boots 1/31; both wounds appear smaller in fact the area on the right medial is just about closed. Surface eschar. We have been using silver alginate under Unna boots. The patient is less active now spends let much less time on her feet and I think this is contributed to the general improvement in the wound condition 2/7; both wounds appear smaller. I was hopeful the right medial would be closed however there there is still the same small open area. Slight amount of surface eschar on the left the dimensions are smaller there is eschar but the wound edges appear to be free. We have been using silver alginate under Unna boot's 2/14; both wounds once again measure smaller. Circumferential eschar on the left medial. We have been using silver alginate under Unna boots with gradual improvement 2/21; the area on the right medial malleolus has healed. The area on the left is smaller. We have been using silver alginate and Unna boots. We can discharge wrapping the right leg she has 20/30 stockings at home she will need to protect the scar tissue in this area 2/28; the  area on the right medial malleolus remains closed  the patient has a compression stocking. The area on the left is smaller. We have been using silver alginate and Unna boots. 3/6 the area on the right medial ankle remains closed. Good edema control noted she is using her own compression stocking. The area on the left medial ankle is smaller. We have been managing this with silver alginate and Unna boots which we will continue today. 3/13; the area on the right medial ankle remains closed and I'm declaring it healed today. When necessary the left is about the same still a healthy-looking surface but no major change and wound area. No evidence of infection and using silver alginate under unna and generally making considerable improvement 3/27 the area on the right medial ankle remains closed the area on the left is about the same as last week. Certainly not any worse we have been using silver alginate under an Unna boot 4/3; the area on the right medial ankle remains closed per the patient. We did not look at this wound. The wound on the left medial ankle is about the same surface looks healthy we have been using silver alginate under an Unna boot 4/10; area on the right medial ankle remains closed per the patient. We did not look at this wound. The wound on the left medial ankle is slightly larger. The patient complains that the [facility] caused burning pain all week. She also told us that she was a lot more active this week. Changed her back to silver alginate 4/17; right medial ankle still closed per the patient. Left medial ankle is slightly larger. Using silver alginate. She did not tolerate Hydrofera Blue on this area 4/24; right medial ankle remains closed we have not look at this. The left medial ankle continues to get larger today by about a centimeter. We have been using silver alginate under Unna boots. She complains about 4 layer compression as an alternative. She has been up on her  feet working on her garden 5/8; right medial ankle remains closed we did not look at this. The left medial ankle has increased in size about 100%. We have been using silver alginate under Unna boots. She noted increased pain this week and was not surprised that the wound is deteriorated 5/15; no major change in SA however much less erythema ( one week of doxy ocellulitis). 5/22-63 year old female returns at 1 week to the clinic for left medial ankle wound for which we have been using silver alginate under 3 layer compression She was placed on DOXY at last visit - the wound is wider at this visit. She is in 3 layer compression 5/29; change to [facility] last week. I had given her empiric doxycycline 2 weeks ago for a week. She is in 3 layer compression. She complains of a lot of pain and drainage on presentation today. 6/5; using Hydrofera Blue. I gave her doxycycline recently empirically for erythema and pain around the wound. Believe her cultures showed enterococcus which not would not have been well covered by doxycycline nevertheless the wound looks better and I don't feel specifically that the enterococcus needs to be covered. She has a new what looks like a wrap injury on her lateral left ankle. 6/12; she is using Hydrofera Blue. She has a new area on the left anterior lower tibial area. This was a wrap injury last week. 6/19; the patient is using Hydrofera Blue. She arrived with marked inflammation and erythema around the wound and tenderness. 12/01/18 on evaluation today patient  appears to be doing a little bit better based on what I'm hearing from the standpoint of lassos evaluation to this as far as the overall appearance of the wound is concerned. Then sometime substandard she typically sees Dr. Dellia Nims. Nonetheless overall very pleased with the progress that she's made up to this point. No fevers, chills, nausea, or vomiting noted at this time. 7/10; some improvement in the surface  area. Aggressively debrided last week apparently. I went ahead with the debridement today although the patient does not tolerate this very well. We have been using Iodoflex. Still a fair amount of drainage 7/17; slightly smaller. Using Iodoflex. 7/24; no change from last week in terms of surface area. We have been using Iodoflex. Surface looks and continues to look somewhat better 7/31; surface area slightly smaller better looking surface. We have been using Iodoflex. This is under Unna boot compression 8/7-Patient presents at 1 week with Unna boot and Iodoflex, wound appears better 8/14-Patient presents at 1 week with Iodoflex, we use the Unna boot, wound appears to be stable better.Patient is getting Botox treatment for the inversion of the foot for tendon release, Next week 8/21; we are using Iodoflex. Unna boot. The wound is stable in terms of surface area. Under illumination there is some areas of the wound that appear to be either epithelialized or perhaps this is adherent slough at this point I was not really clear. It did not wipe off and I was reluctant to debride this today. 8/28; we are using Iodoflex in an Unna boot. Seems to be making good improvement. 9/4; using Iodoflex and wound is slightly smaller. 9/18; we are using Iodoflex with topical silver nitrate when she is here. The wound continues to be smaller 10/2; patient missed her appointment last week due to GI issues. She left and Iodoflex based dressing on for 2 weeks. Wound is about the same size about the size of a dime on the left medial lower 10/9 we have been using Iodoflex on the medial left ankle wound. She has a new superficial probable wrap injury on the dorsal left ankle Objective Constitutional Sitting or standing Blood Pressure is within target range for patient.. Pulse regular and within target range for patient.Marland Kitchen Respirations regular, non-labored and within target range.. Temperature is normal and within the  target range for the patient.. Vitals Time Taken: 10:00 AM, Height: 68 in, Weight: 127 lbs, BMI: 19.3, Temperature: 98.0 F, Pulse: 59 bpm, Respiratory Rate: 17 breaths/min, Blood Pressure: 124/42 mmHg. Eyes Conjunctivae clear. No discharge.no icterus. Respiratory work of breathing is normal. Cardiovascular Pedal pulses palpable. There is no edema. Psychiatric appears at normal baseline. General Notes: Wound exam; left medial malleolus. I do not think there is much change in this. Under illumination there is some what looks to be epithelialization. ooHe has a new area on the left dorsal ankle superficial skin breakdown some irritation Integumentary (Hair, Skin) Wound #11 status is Open. Original cause of wound was Blister. The wound is located on the Left,Anterior Ankle. The wound measures 0.7cm length x 0.7cm width x 0.1cm depth; 0.385cm^2 area and 0.038cm^3 volume. There is Fat Layer (Subcutaneous Tissue) Exposed exposed. There is no tunneling or undermining noted. There is a small amount of serosanguineous drainage noted. The wound margin is distinct with the outline attached to the wound base. There is large (67-100%) red, pink granulation within the wound bed. There is no necrotic tissue within the wound bed. Wound #3 status is Open. Original cause of wound was  Gradually Appeared. The wound is located on the Left,Medial Malleolus. The wound measures 1.7cm length x 1.9cm width x 0.1cm depth; 2.537cm^2 area and 0.254cm^3 volume. There is Fat Layer (Subcutaneous Tissue) Exposed exposed. There is no tunneling or undermining noted. There is a medium amount of serosanguineous drainage noted. The wound margin is flat and intact. There is large (67-100%) pink granulation within the wound bed. There is a small (1-33%) amount of necrotic tissue within the wound bed including Adherent Slough. 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 There was a Haematologist Compression Therapy Procedure by Levan Hurst, RN. Post procedure Diagnosis Wound #11: Same as Pre-Procedure Wound #3 Pre-procedure diagnosis of Wound #3 is a Venous Leg Ulcer located on the Left,Medial Malleolus . There was a Haematologist Compression Therapy Procedure by Levan Hurst, RN. Post procedure Diagnosis Wound #3: Same as Pre-Procedure Plan Follow-up Appointments: Return Appointment in 1 week. Dressing Change Frequency: Wound #11 Left,Anterior Ankle: Do not change entire dressing for one week. Wound #3 Left,Medial Malleolus: Do not change entire dressing for one week. Skin Barriers/Peri-Wound Care: Moisturizing lotion TCA Cream or Ointment - mixed with lotion Wound Cleansing: Clean wound with Wound Cleanser - clean with anasept with dressing changes May shower with protection. Primary Wound Dressing: Wound #11 Left,Anterior Ankle: Hydrofera Blue Wound #3 Left,Medial Malleolus: Hydrofera Blue Secondary Dressing: Wound #3 Left,Medial Malleolus: Dry Gauze [place] Wound #11 Left,Anterior Ankle: Dry Gauze Edema Control: Unna Boot to Left Lower Extremity - no kerlix layer Avoid standing for long periods of time Elevate legs to the level of the heart or above for 30 minutes daily and/or when sitting, a frequency of: Support Garment 20-30 mm/Hg pressure to: - compression stocking right leg daily 1. I change the primary dressing back to [facility] still under [employer]) Signed: 03/13/2019 10:27:00 AM By: Linton Ham MD Entered By: Linton Ham on 03/11/2019  11:08:04 -------------------------------------------------------------------------------- SuperBill Details Patient Name: Date of Service: [name] 03/11/2019 Medical Record [id number] Patient Account Number: [id number] Date of Birth/Sex: Treating RN: February 19, 1956 (63 y.o. 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

## 2019-03-18 ENCOUNTER — Encounter (HOSPITAL_BASED_OUTPATIENT_CLINIC_OR_DEPARTMENT_OTHER): Payer: Medicare Other | Admitting: Internal Medicine

## 2019-03-18 ENCOUNTER — Other Ambulatory Visit: Payer: Self-pay

## 2019-03-18 DIAGNOSIS — L97322 Non-pressure chronic ulcer of left ankle with fat layer exposed: Secondary | ICD-10-CM | POA: Diagnosis not present

## 2019-03-18 NOTE — Progress Notes (Signed)
CINDERELLA, EATON ([id number]) Visit Report for 03/18/2019 Debridement Details Patient Name: Date of Service: UMAIMA, GEHMAN 03/18/2019 10:00 AM Medical Record [id number] Patient Account Number: [id number] Date of Birth/Sex: 12/23/1955 (63 y.o. F) Treating RN: Levan Hurst Primary Care Provider: [name] Other Clinician: [name] Referring Provider: Treating Provider/Extender:Jireh Vinas, Tawny Asal in Treatment: 327 Debridement Performed for Wound #3 Left,Medial Malleolus Assessment: Performed By: Physician [name]., MD Debridement Type: Debridement Severity of Tissue Pre Fat layer exposed Debridement: Level of Consciousness (Pre- Awake and Alert procedure): Pre-procedure Verification/Time Out Taken: Yes - 11:00 Start Time: 11:00 Pain Control: Lidocaine 4% Topical Solution Total Area Debrided (L x W): 1.1 (cm) x 1.5 (cm) = 1.65 (cm) Tissue and other material Viable, Non-Viable, Slough, Subcutaneous, Slough debrided: Level: Skin/Subcutaneous Tissue Debridement Description: Excisional Instrument: Curette Bleeding: Minimum End Time: 11:02 Procedural Pain: 0 Post Procedural Pain: 0 Response to Treatment: Procedure was tolerated well Level of Consciousness Awake and Alert (Post-procedure): Post Debridement Measurements of Total Wound Length: (cm) 1.1 Width: (cm) 1.5 Depth: (cm) 0.1 Volume: (cm) 0.13 Character of Wound/Ulcer Post Improved Debridement: Severity of Tissue Post Debridement: Fat layer exposed Post Procedure Diagnosis Same as Pre-procedure Electronic Signature(s) Signed: 03/18/2019 5:33:39 PM By: Linton Ham MD Signed: 03/18/2019 6:00:55 PM By: Levan Hurst RN, BSN Entered By: Linton Ham on 03/18/2019 11:34:46 -------------------------------------------------------------------------------- HPI Details Patient Name: Date of Service: [name] 03/18/2019 10:00 AM Medical Record  [id number] Patient Account Number: [id number] Date of Birth/Sex: Treating RN: Nov 08, 1955 (63 y.o. [name] Primary Care Provider: [name] Other Clinician: [name] Referring Provider: Treating Provider/Extender:Chilton Sallade, Tawny Asal in Treatment: 327 History of Present Illness HPI Description: the remaining wound is over the left medial ankle. Similar wound over the right medial ankle healed largely with use of Apligraf. Most recently we have been using Hydrofera Blue over this wound with considerable improvement. The patient has been extensively worked up in the past for her venous insufficiency and she is not a candidate for antireflux surgery although I have none of the details available currently. 08/24/14; considerable improvement today. About 50% of this wound areas now epithelialized. The base of the wound appears to be healthier granulation.as opposed to last week when she had deteriorated a considerable improvement 08/17/14; unfortunately the wound has regressed somewhat. The areas of epithelialization from the superior aspect are not nearly as healthy as they were last week. The patient thinks her Hydrofera Blue slipped. 09/07/14; unfortunately the area has markedly regressed in the 2 weeks since I've seen this. There is an odor surrounding erythema. The healthy granulation tissue that we had at the base of the wound now is a dusky color. The nurse reports green drainage 09/14/14; the area looks somewhat better than last week. There is less erythema and less drainage. The culture I did did not show any growth. Nevertheless I think it is better to continue the Cipro and doxycycline for a further week. The remaining wound area was debridement. 09/21/14. Wound did not require debridement last week. Still less erythema and less drainage. She can complete her antibiotics. The areas of epithelialization in the superior aspect of the wound do not look  as healthy as they did some weeks ago 10/05/14 continued improvement in the condition of this wound. There is advancing epithelialization. Less aggressive debridement required 10/19/14 continued improvement in the condition and volume of this wound. Less aggressive debridement to the inferior part of this to remove surface slough and fibrinous eschar  11/02/14 no debridement is required. The surface granulation appears healthy although some of her islands of epithelialization seem to have regressed. No evidence of infection 11/16/14; lites surface debridement done of surface eschar. The wound does not look to be unhealthy. No evidence of infection. Unfortunately the patient has had podiatry issues in the right foot and for some reason has redeveloped small surface ulcerations in the medial right ankle. Her original presentation involved wounds in this area 11/23/14 no debridement. The area on the right ankle has enlarged. The left ankle wound appears stable in terms of the surface although there is periwound inflammation. There has been regression in the amount of new skin 11/30/14 no debridement. Both wound areas appear healthy. There was no evidence of infection. The the new area on the right medial ankle has enlarged although that both the surfaces appear to be stable. 12/07/14; Debridement of the right medial ankle wound. No no debridement was done on the left. 12/14/14 no major change in and now bilateral medial ankle wounds. Both of these are very painful but the no overt evidence of infection. She has had previous venous ablation 12/21/14; patient states that her right medial ankle wound is considerably more painful last week than usual. Her left is also somewhat painful. She could not tolerate debridement. The right medial ankle wound has fibrinous surface eschar 12/28/14 this is a patient with severe bilateral venous insufficiency ulcers. For a considerable period of time we actually had the one on  the right medial ankle healed however this recently opened up again in June. The left medial ankle wound has been a refractory area with some absent flows. We had some success with Hydrofera Blue on this area and it literally closed by 50% however it is recently opened up Foley. Both of these were debridement today of surface eschar. She tolerates this poorly 01/25/15: No change in the status of this. Thick adherent escar. Very poor tolerance of any attempt at debridement. I had healed the right medial malleolus wound for a considerable amount of time and had the left one down to about 50% of the volume although this is totally regressed over the last 48 weeks. Further the right leg has reopened. she is trying to make a appointment with pain and vascular, previous ablations with Dr. Aleda Grana. I do not believe there is an arterial insufficiency issue here 02/01/15 the status of the adherent eschar bilaterally is actually improved. No debridement was done. She did not manage to get vascular studies done 02/08/15 continued debridement of the area was done today. The slough is less adherent and comes off with less pressure. There is no surrounding infection peripheral pulses are intact 02/15/15 selective debridement with a disposable curette. Again the slough is less adherent and comes off with less difficulty. No surrounding infection peripheral pulses are intact. 02/22/15 selective debridement of the right medial ankle wound. Slough comes off with less difficulty. No obvious surrounding infection peripheral pulses are intact I did not debridement the one on the left. Both of these are stable to improved 03/01/15 selective debridement of both wound areas using a curette to. Adherent slough cup soft with less difficulty. No obvious surrounding infection. The patient tells me that 2 days ago she noted a rash above the right leg wrap. She did not have this on her lower legs when she change this over she  arrives with widespread left greater than right almost folliculitis-looking rash which is extremely pruritic. I don't see anything to culture  here. There is no rash on the rest of her body. She feels well systemically. 03/08/15; selective debridement of both wounds using a curette. Base of this does not look unhealthy. She had limegreen drainage coming out of the left leg wound and describes a lot of drainage. The rash on her left leg looks improved to. No cultures were done. 03/22/15; patient was not here last week. Basal wounds does not look healthy and there is no surrounding erythema. No drainage. There is still a rash on the left leg that almost looks vasculitic however it is clearly limited to the top of where the wrap would be. 04/05/15; on the right required a surgical debridement of surface eschar and necrotic subcutaneous tissue. I did not debridement the area on the left. These continue to be large open wounds that are not changing that much. We were successful at one point in healing the area on the right, and at the same time the area on the left was roughly half the size of current measurements. I think a lot of the deterioration has to do with the prolonged time the patient is on her feet at work 04/19/15 I attempted-like surface debridement bilaterally she does not tolerate this. She tells me that she was in allergic care yesterday with extreme pain over her left lateral malleolus/ankle and was told that she has an "sprain" 05/03/15; large bilateral venous insufficiency wounds over the medial malleolus/medial aspect of her ankles. She complains of copious amounts of drainage and his usual large amounts of pain. There is some increasing erythema around the wound on the right extending into the medial aspect of her foot to. historically she came in with these wounds the right one healed and the left one came down to roughly half its current size however the right one is reopened and  the left is expanded. 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 05/10/15 large bilateral venous insufficiency wounds. There is less adherence surface left however the surface culture that I did last week grew pseudomonas therefore bilateral selective debridement score necessary. There is surrounding erythema. The patient describes severe bilateral drainage and a lot of pain in the left ankle. Apparently her podiatrist was were ready to do a cortisone shot 05/17/15; the patient complains of pain and again copious amounts of drainage. 05/24/15; we used Iodo flex last week. Patient notes considerable improvement in wound drainage. Only needed to change this once. 05/31/15; we continued Iodoflex; the base of these large wounds bilaterally is not too bad but there is probably likely a significant bioburden here. I would like to debridement just doesn't tolerate it. 06/06/14 I would like to continue the Iodoflex although she still hasn't managed to obtain supplies. She has bilateral medial malleoli or large wounds which are mostly superficial. Both of them are covered circumferentially with some nonviable fibrinous slough although she tolerates debridement very poorly. She apparently has an appointment for an ablation on the right leg by interventional radiology. 06/14/15; the patient arrives with the wounds and static condition. We attempted a debridement although she does not do well with this secondary to pain. I 07/05/15; wounds are not much smaller however there appears to be a cleaner granulating base. The left has tight fibrinous slough greater than the right. Debridement is tolerated poorly due to pain. Iodoflex is done more for these wounds in any of the multitude of different dressings I have tried  on the left 1 and then subsequently the right. 07/12/15; no change in the condition of this wound. I am able to do an aggressive debridement on the right  but not the left. She simply cannot tolerate it. We have been using Iodoflex which helps somewhat. It is worthwhile remembering that at one point we healed the right medial ankle wound and the left was about 25% of the current circumference. We have suggested returning to vascular surgery for review of possible further ablations for one reason or another she has not been able to do this. 07/26/15 no major change in the condition of either wound on her medial ankle. I did not attempt to debridement of these. She has been aggressively scrubbing these while she is in the shower at home. She has her supply of Iodoflex which seems to have done more for these wounds then anything I have put on recently. 08/09/15 wound area appears larger although not verified by measurements. Using Iodoflex 09/05/2015 -- she was here for avisit today but had significant problems with the wound and I was asked to see her for a physician opinion. I have summarize that this lady has had surgery on her left lower extremity about 10 years ago where the possible veins stripping was done. She has had an opinion from interventional radiology around November 2016 where no further sclerotherapy was ordered. The patient works 12 hours a day and stands on a concrete floor with work boots and is unable to get the proper compression she requires and cannot elevate her limbs appropriately at any given time. She has recently grown Pseudomonas from her wound culture but has not started her ciprofloxacin which was called in for her. 09/13/15 this continues to be a difficult situation for this patient. At one point I had this wound down to a 1.5 x 1.5" wound on her left leg. This is deteriorated and the right leg has reopened. She now has substantial wounds on her medial calcaneus, malleoli and into her lower leg. One on the left has surface eschar but these are far too painful for me to debridement here. She has a vascular surgery  appointment next week to see if anything can be done to help here. I think she has had previous ablations several years ago at [facility]. She has no major edema. She tells me that she did not get product last time [facility] and went for several days without it. She continues to work in work boots 12 hours a day. She cannot get compression/4-layer under her work boots. 09/20/15 no major change. Periwound edema control was not very good. Her point with pain and vascular is next Wednesday the 25th 09/28/15; the patient is seen vascular surgery and is apparently scheduled for repeat duplex ultrasounds of her bilateral lower legs next week. 10/05/15; the patient was seen by Dr. Doren Custard of vascular surgery. He feels that she should have arterial insufficiency excluded as cause/contributed to her nonhealing stage she is therefore booked for an arteriogram. She has apparently monophasic signals in the dorsalis pedis pulses. She also of course has known severe chronic venous insufficiency with previous procedures as noted previously. I had another long discussion with the patient today about her continuing to work 12 hour shifts. I've written her out for 2 months area had concerns about this as her work location is currently undergoing significant turmoil and this may lead to her termination. She is aware of this however I agree with her that she  simply cannot continue to stand for 12 hours multiple days a week with the substantial wound areas she has. 10/19/15; the Dr. Doren Custard appointment was largely for an arteriogram which was normal. She does not have an arterial issue. He didn't make a comment about her chronic venous insufficiency for which she has had previous ablations. Presumably it was not felt that anything additional could be done. The patient is now out of work as I prescribed 2 weeks ago. Her wounds look somewhat less aggravated presumably because of this. I felt I would give  debridement another try today 10/25/15; no major change in this patient's wounds. We are struggling to get her product that she can afford into her own home through her insurance. 11/01/15; no major change in the patient's wounds. I have been using silver alginate as the most affordable product. I spoke to Dr. Marla Roe last week with her requested take her to the OR for surgical debridement and placement of ACEL. Dr. Marla Roe told me that she would be willing to do this however [facility] will not cover this, fortunately the patient has [place] healthcare of some variant 11/08/15; no major change in the patient's wounds. She has been completely nonviable surface that this but is in too much pain with any attempted debridement are clinic. I have arranged for her to see Dr. Marla Roe ham of plastic surgery and this appointment is on Monday. I am hopeful that they will take her to the OR for debridement, possible ACEL ultimately possible skin graft 11/22/15 no major change in the patient's wounds over her bilateral medial calcaneus medial malleolus into the lower legs. Surface on these does not look too bad however on the left there is surrounding erythema and tenderness. This may be cellulitis or could him sleepy tinea. 11/29/15; no major changes in the patient's wounds over her bilateral medial malleolus. There is no infection here and I don't think any additional antibiotics are necessary. There is now plan to move forward. She sees Dr. Marla Roe in a week's time for preparation for operative debridement and ACEL placement I believe on 7/12. She then has a follow-up appointment with Dr. Marla Roe on 7/21 12/28/15; the patient returns today having been taken to the [place] [facility] by Dr. Marla Roe 12/12/15 she underwent debridement, intraoperative cultures [which were negative]. She had placement of a wound VAC. Parent really ACEL was not available to be placed. The wound VAC foam apparently adhered  to the wound since then she's been using silver alginate, Xeroform under Ace wraps. She still says there is a lot of drainage and a lot of pain 01/31/16; this is a patient I see monthly. I had referred her to Dr. Marla Roe him of plastic surgery for large wounds on her bilateral medial ankles. She has been to the OR twice once in early July and once in early August. She tells me over the last 3 weeks she has been using the wound VAC with ACEL underneath it. On the right we've simply been using silver alginate. Under Kerlix Coban wraps. 02/28/16; this is a patient I'm currently seeing monthly. She is gone on to have a skin graft over her large venous insufficiency ulcer on the left medial ankle. This was done by Dr. Marla Roe him. The patient is a bit perturbed about why she didn't have one on her right medial ankle wound. She has been using silver alginate to this. 03/06/16; I received a phone call from her plastic surgery Dr. Marla Roe. She expressed some  concern about the viability of the skin graft she did on the left medial ankle wound. Asked me to place Endoform on this. She told me she is not planning to do a subsequent skin graft on the right as the left one did not take very well. I had placed Hydrofera Blue on the right 03/13/16; continue to have a reasonably healthy wound on the right medial ankle. Down to 3 mm in terms of size. There is epithelialization here. The area on the left medial ankle is her skin graft site. I suppose the last week this looks somewhat better. She has an open area inferiorly however in the center there appears to be some viable tissue. There is a lot of surface callus and eschar that will eventually need to come off however none of this looked to be infected. Patient states that the is able to keep the dressing on for several days which is an improvement. 03/20/16 no major change in the circumference of either wound however on the left side the patient was at  Dr. Eusebio Friendly office and they did a debridement of left wound. 50% of the wound seems to be epithelialized. I been using Endoform on the left Hydrofera Blue in the right 03/27/16; she arrives today with her wound is not looking as healthy as they did last week. The area on the right clearly has an adherent surface to this a very similar surface on the left. Unfortunately for this patient this is all too familiar problem. Clearly the Endoform is not working and will need to change that today that has some potential to help this surface. She does not tolerate debridement in this clinic very well. She is changing the dressing wants 04/03/16; patient arrives with the wounds looking somewhat better especially on the right. Dr. Migdalia Dk change the dressing to silver alginate when she saw her on Monday and also sold her some compression socks. The usefulness of the latter is really not clear and woman with severely draining wounds. 04/10/16; the patient is doing a bit of an experiment wearing the compression stockings that Dr. Migdalia Dk provided her to her left leg and the out of legs based dressings that we provided to the right. 05/01/16; the patient is continuing to wear compression stockings Dr. Migdalia Dk provided her on the left that are apparently silver impregnated. She has been using Iodoflex to the right leg wound. Still a moderate amount of drainage, when she leaves here the wraps only last for 4 days. She has to change the stocking on the left leg every night 05/15/16; she is now using compression stockings bilaterally provided by Dr. Marla Roe. She is wearing a nonadherent layer over the wounds so really I don't think there is anything specific being done to this now. She has some reduction on the left wound. The right is stable. I think all healing here is being done without a specific dressing 06/09/16; patient arrives here today with not much change in the wound certainly in diameter to large  circular wounds over the medial aspect of her ankle bilaterally. Under the light of these services are certainly not viable for healing. There is no evidence of surrounding infection. She is wearing compression stockings with some sort of silver impregnation as prescribed by Dr. Marla Roe. She has a follow-up with her tomorrow. 06/30/16; no major change in the size or condition of her wounds. These are still probably covered with a nonviable surface. She is using only her purchase stockings. She did see  Dr. Marla Roe who seemed to want to apply Dakin's solution to this I'm not extreme short what value this would be. I would suggest Iodoflex which she still has at home. 07/28/16; I follow Mrs. Clayburn episodically along with Dr. Marla Roe. 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 She has been applying a topical collagen ointment to both wounds with Adaptic. I don't think Dr. Marla Roe is planning to take her back to the OR. 08/19/16; I follow Mrs. Jeneen Rinks on a monthly basis along with Dr. Marla Roe of plastic surgery. 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 I been following her for a number of years. At one point I was able to get the right medial malleolus wound to heal and had the left medial malleolus down to about half its current size however and I had to send her to plastic surgery for an operative debridement. Since then things have been stable to slightly improve the area on the right is slightly better one in the left about the same although there is much less adherent surface than I'm used to with this patient. She is using some form of liquid collagen gel that Dr. Marla Roe provided a Kerlix cover with the patient's own pressure stockings. She tells me that she has extreme pain in both ankles and along the lateral aspect of both feet. She has been unable  to work for some period of time. She is telling me she is retiring at the beginning of April. She sees Dr. Doran Durand of orthopedics next week 09/22/16; patient has not seen Dr. Marla Roe since the last time she is here. I'm not really sure what she is using to the wounds other than bits and pieces of think she had left over including most recently Hydrofera Blue. She is using juxtalite stockings. She is having difficulty with her husband's recent illness "stroke". She is having to transport him to various doctors appointments. Dr. Marla Roe left her the option of a repeat debridement with ACEL however she has not been able to get the time to follow-up on this. She continues to have a fair amount of drainage out of these wounds with certainly precludes leaving dressings on all week 10/13/16; patient has not seen Dr. Marla Roe since she was last in our clinic. I'm not really sure what she is doing with the wounds, we did try to get her [facility] and I think she is actually using this most of the time. Because of drainage she states she has to change this every second day although this is an improvement from what she used to do. She went to see Dr. Doran Durand who did not think she had a muscular issue with regards to her feet, he referred her to a neurologist and I think the appointment is sometime in June. I changed her back to Iodoflex which she has used in the past but not recently. 11/03/16; the patient has been using Iodoflex although she ran out of this. Still claims that there is a lot of drainage although the wound does not look like this. No surrounding erythema. She has not been back to see Dr. Marla Roe 11/24/16; the patient has been using Iodoflex again but she ran out of it 2 or 3 days ago. There is no major change in the condition of either one of these wounds in fact they are larger and covered in a thick adherent surface slough/nonviable  tissue especially on the left. She does not  tolerate mechanical debridement in our clinic. Going back to see Dr. Marla Roe of plastic surgery for an operative debridement would seem reasonable. 12/15/16; the patient has not been back to see Dr. Marla Roe. She is been dealing with a series of illnesses and her husband which of monopolized her time. She is been using Sorbact which we largely supplied. She states the drainage is bad enough that it maximum she can go 2-3 days without changing the dressing 01/12/2017 -- the patient has not been back for about 4 weeks and has not seen Dr. Marla Roe not does she have any appointment pending. 01/23/17; patient has not seen Dr. Marla Roe even though I suggested this previously. She is using Santyl that was suggested last week by Dr. Con Memos this Cost her $16 through her insurance which is indeed surprising 02/12/17; continuing Santyl and the patient is changing this daily. A lot of drainage. She has not been back to see plastic surgery she is using an Ace wrap. Our intake nurse suggested wrap around stockings which would make a good reasonable alternative 02/26/17; patient is been using Santyl and changing this daily due to drainage. She has not been to see plastic surgery she uses in April Ace wrap to control the edema. She did obtain extremitease stockings but stated that the edema in her leg was to big for these 03/20/17; patient is using Santyl and Anasept. Surfaces looked better today the area on the right is actually measuring a little smaller. She has states she has a lot of pain in her feet and ankles and is asking for a consult to pain control which I'll try to help her with through our case manager. 04/10/17; the patient arrives with better-looking wound surfaces and is slightly smaller wound on the left she is using a combination of Santyl and Anasept. She has an appointment or at least as started in the pain control center associated with [facility] 05/14/17; this is a patient who  I followed for a prolonged period of time. 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 At one point I had this down to a much smaller wound on the left however these reopened and we've never been able to get these to heal. She has been using Santyl and Anasept gel although 2 weeks ago she ran out of the Anasept gel. She has a stable appearance of the wound. She is going to the wound care clinic at [facility]. They wanted do a nerve block/spinal block although she tells me she is reluctant to go forward with that. 05/21/17; this is a patient I have followed for many years. 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 Chronic pain and deformity in her ankles as well. She is been to see plastic surgery as well as orthopedics. Using PolyMem AG most recently/Kerramax/ABDs and 2 layer compression. She has managed to keep this on and she is coming in for a nurse check to change the dressing on Tuesdays, we see her on Fridays 06/05/17; really quite a good looking surface and the area especially on the right medial has contracted in terms of dimensions. Well granulated healthy-looking tissue on both sides. Even with an open curet there is nothing that even feels abnormal here. This is as good as I've seen this in quite some time. We have been using PolyMem AG and bringing her in for a nurse check 06/12/17; really quite good surface on both  of these wounds. The right medial has contracted a bit left is not. We've been using PolyMem and AG and she is coming in for a nurse visit 06/19/17; we have been using PolyMem AG and bringing her in for a nurse check. Dimensions of her wounds are not better but the surfaces looked better bilaterally. She complained of bleeding last night and the left wound and increasing pain bilaterally. She states her wound pain is more neuropathic than just the wounds. There was some suggestion that this was radicular from her pain  management doctor in talking to her it is really difficult to sort this out. 06/26/17; using PolyMem and AG and bringing her in for a nurse check as All of this and reasonably stable condition. Certainly not improved. The dimensions on the lateral part of the right leg look better but not really measuring better. The medial aspect on the left is about the same. 07/03/16; we have been using PolyMen AG and bringing her in for a nurse check to change the dressings as the wounds have drainage which precludes once weekly changing. We are using all secondary absorptive dressings.our intake nurse is brought up the idea of using a wound VAC/snap VAC on the wound to help with the drainage to see if this would result in some contraction. This is not a bad idea. The area on the right medial is actually looking smaller. Both wounds have a reasonable-looking surface. There is no evidence of cellulitis. The edema is well controlled 07/10/17; the patient was denied for a snap VAC by her insurance. The major issue with these wounds continues to be drainage. We are using wicked PolyMem AG and she is coming in for a nurse visit to change this. The wounds are stable to slightly improved. The surface looks vibrant and the area on the right certainly has shrunk in size but very slowly 07/17/17; the patient still has large wounds on her bilateral medial malleoli. Surface of both of these wounds looks better. The dimensions seem to come and go but no consistent improvement. There is no epithelialization. We do not have options for advanced treatment products due to insurance issues. They did not approve of the wound VAC to help control the drainage. More recently we've been using PolyMem and AG wicked to allow drainage through. We have been bringing her in for a nurse visit to change this. We do not have a lot of options for wound care products and the home again due to insurance issues 07/24/17; the patient's wound actually  looks somewhat better today. No drainage measurements are smaller still healthy-looking surface. We used silver collagen under PolyMen started last week. We have been bringing her in for a dressing change 07/31/17; patient's wound surface continued to look better and I think there is visible change in the dimensions of the wound on the right. Rims of epithelialization. We have been using silver collagen under PolyMen and bringing her in for a dressing change. There appears to be less drainage although she is still in need of the dressing change 08/07/17. Patient's wound surface continues to look better on both sides and the area on the right is definitely smaller. We have been using silver collagen and PolyMen. She feels that the drainage has been it has been better. I asked her about her vascular status. She went to see Dr. Aleda Grana at [facility] and had some form of ablation. I don't have much detail on this. I haven't my notes from 2016  that she was not a candidate for any further ablation but I don't have any more information on this. We had referred her to vein and vascular I don't think she ever went. He does not have a history of PAD although I don't have any information on this either. We don't even have ABIs in our record 08/14/17; we've been using silver collagen and PolyMen cover. And putting the patient and compression. She we are bringing her in as a nurse visit to change this because ofarge amount of drainage. We didn't the ABIs in clinic today since they had been done in many moons 1.2 bilaterally. She has been to see vein and vascular however this was at [facility] and she had ablation although I really don't have any information on this all seemed biking get a report. She is also been operatively debrided by plastic surgery and had a cell placed probably 8-12 months ago. This didn't have a major effect. We've been making some gains with current dressings 08/19/17-She is here  in follow-up evaluation for bilateral medial malleoli ulcers. She continues to tolerate debridement very poorly. We will continue with recently changed topical treatment; if no significant improvement may consider switching to Iodosorb/Iodoflex. 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 She has been using silver collagen and PolyMem. I believe she has been used and tried on Iodoflex before. 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 These have been very refractory over time. We have been using silver collagen and PolyMen. She is coming in once a week for a doctor's and once a week for nurse visits. We are actually making some progress 09/18/17; the patient's wounds are smaller especially on the right medial. She arrives today to upset to consider even washing these off with Anasept which I think is been part of the reason this is been closing. We've been using collagen covered in PolyMen otherwise. It is noted that she has a small area of folliculitis on the right medial calf that. As we are wrapping her legs I'll give her a short course of doxycycline to make sure this doesn't amount to anything. She is a long list of complaints today including imbalance, shortness of breath on exertion, inversion of her left ankle. With regards to the latter complaints she is been to see orthopedics and they offered her a tendon release surgery I believe but wanted her wounds to be closed first. I have recommended she go see her primary physician with regards to everything else. 09/25/17; patient's wounds are about the same size. We have made some progress bilaterally although not in recent weeks. She will not allow me T wash these wounds with Anasept even if she is doing  her cell. Wheeze we've been using collagen covered in PolyMen. Last week she had a small area of folliculitis this is now opened into a small wound. She completed 5 days of trimethoprim sulfamethoxazole 10/02/17; unfortunately the area on her left medial ankle is worse with a larger wound area towards the Achilles. The patient complains of a lot of pain. She will not allow debridement although visually I don't think there is anything to debridement in any case. We have been using silver collagen and PolyMen for several months now. Initially we are making some progress although  I'm not really seeing that today. We will move back to [facility]. His admittedly this is a bit of a repeat however I'm hoping that his situation is different now. The patient tells me she had her leg on the left give out on her yesterday this is process some pain. 10/09/17; the patient is seen twice a week largely because of drainage issues coming out of the chronic medial bimalleolar wounds that are chronic. Last week the dimensions of the one on the left looks a little larger I changed her to [facility]. She comes in today with a history of terrible pain in the bilateral wound areas. She will not allow debridement. She will not even allow a tissue culture. There is no surrounding erythema no no evidence of cellulitis. We have been putting her Kerlix Coban man. She will not allow more aggressive compression as there was a suggestion to put her in 3 layer wraps. 10/16/17; large wounds on her bilateral medial malleoli. These are chronic. Not much change from last week. The surface looks have healthy but absolutely no epithelialization. A lot of pain little less so of drainage. She will not allow debridement or even washing these off in the vigorous fashion with Anasept. 10/23/17; large wounds on her bilateral malleoli which are chronic. Some improvement in terms of size perhaps on the right since last time I saw these.  She states that after we increased the 3 layer compression there was some bleeding, when she came in for a nurse visit she did not want 3 layer compression put back on about our nurse managed to convince her. She has known chronic venous visit issues and I'm hoping to get her to tolerate the 3 layer compression. using Hydrofera Blue 10/30/17; absolutely no change in the condition of either wound although we've had some improvement in dimensions on the right.. Attempted to put her in 3 layer compression she didn't tolerated she is back in 2 layer compression. We've been using Hydrofera Blue We looked over her past records. She had venous reflux studies in November 2016. There was no evidence of deep venous reflux on the right. Superficial vein did not show the greater saphenous vein at think this is been previously ablated the small saphenous vein was within normal limits. The left deep venous system showed no DVT the vessels were positive for deep venous reflux in the posterior tibial veins at the ankle. The greater saphenous vein was surgically absent small saphenous vein was within normal limits. She went to vein and vascular at [facility]. I believe she had an ablation on the left greater saphenous vein. I'll update her reflux studies perhaps ever reviewed by vein and vascular. We've made absolutely no progress in these wounds. Will also try to read and TheraSkins through her insurance 11/06/17; W the patient apparently has a 2 week follow-up with vein and vascular I like him to review the whole issue with regards to her previous vascular workup by Dr. Aleda Grana. We've really made no progress on these wounds in many months. She arrives today with less viable looking surface on the left medial ankle wound. This was apparently looking about the same on Tuesday when she was here for nurse visit. 11/13/17; deep tissue culture I did last time of the left lower leg showed multiple organisms without  any predominating. In particular no Staphylococcus or group A strep were isolated. We sent her for venous reflux studies. She's had a previous left greater saphenous vein stripping  and I think sclerotherapy of the right greater saphenous vein. She didn't really look at the lesser saphenous vein this both wounds are on the medial aspect. She has reflux in the common femoral vein and popliteal vein and an accessory vein on the right and the common femoral vein and popliteal vein on the left. I'm going to have her go to see vein and vascular just the look over things and see if anything else beside aggressive compression is indicated here. We have not been able to make any progress on these wounds in spite of the fact that the surface of the wounds is never look too bad. 11/20/17; no major change in the condition of the wounds. Patient reports a large amount of drainage. She has a lot of complaints of pain although enlisting her today I wonder if some of this at least his neuropathic rather than secondary to her wounds. She has an appointment with vein and vascular on 12/30/17. The refractory nature of these wounds in my mind at least need vein and vascular to look over the wounds the recent reflux studies we did and her history to see if anything further can be done here. I also note her gait is deteriorated quite a bit. 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 She has a bilateral Trendelenburg gait. I wonder if this is neuropathic or perhaps multilevel radicular. 11/27/17; her wounds actually looks slightly better. Healthy-looking granulation tissue a scant amount of epithelialization. [place] healthcare will not pay for [employer]. They will play for tri layer Oasis and Dermagraft. This is not a diabetic ulcer. We'll try for the tri layer Oasis. She still complains of some drainage. She has a vein and vascular appointment on 12/30/17 12/04/17; the wounds visually look quite good.  Healthy-looking granulation with some degree of epithelialization. We are still waiting for response to our request for trial to try layer Oasis. Her appointment with vascular to review venous and arterial issues isn't sold the end of July 7/31. Not allow debridement or even vigorous cleansing of the wound surface. 12/18/17; slightly smaller especially on the right. 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 We've been using Hydrofera Blue. We still are looking into triple layer Oasis through her insurance 01/08/18 on evaluation today patient's wound actually appears to be showing signs of good improvement at this point in time. She has been tolerating the dressing changes without complication. Fortunately there does not appear to be any evidence of infection at this point in time. We have been utilizing silver nitrate which does seem to be of benefit for her which is also good news. Overall I'm very happy with how things seem to be both regards appearance as well as measurement. Patient did see Dr. Bridgett Larsson for evaluation on 12/30/17. In his assessment he felt that stripping would not likely add much more than chronic compression to the patient's healing process. His recommendation was to follow-up in three months with Dr. Doren Custard if she hasn't healed in order to consider referral back to you and see vascular where she previously was in a trial and was able to get her wound to heal. I'll be see what she feels she when you staying compression and he reiterated this as well. 01/13/18 on evaluation today patient appears to actually be doing very well in regard to her bilateral medial malleolus ulcers. She seems to have tolerated the chemical cauterization with silver nitrate last week she did have some pain through that  evening but fortunately states that I'll be see since it seems to be doing better she is overall pleased with the progress. 01/21/18; really quite a remarkable  improvement since I've last seen these wounds. We started using silver nitrate specially on the islands of hyper granulation which for some reason her around the wound circumference. This is really done quite nicely. Primary dressing Hydrofera Blue under 4 layer compression. She seems to be able to hold out without a nurse rewrap. Follow-up in 1 week 01/28/18; we've continued the hydrofera blue but continued with chemical cauterization to the wound area that we started about a month ago for irregular hyper granulation. She is made almost stunning improvement in the overall wound dimensions. I was not really expecting this degree of improvement in these chronic wounds 02/05/18; we continue with Hydrofera Bluebut of also continued the aggressive chemical cauterization with silver nitrate. We made nice progress with the right greater than left wound. 02/12/18. We continued with Hydrofera Blue after aggressive chemical cauterization with silver nitrate. We appear to be making nice progress with both wound areas 02/19/2018; we continue with [facility] after washing the wounds vigorously with Anasept spray and chemical cauterization with silver nitrate. We are making excellent progress. The area on the right's just about closed 02/26/2018. The area on the left medial ankle had too much necrotic debris today. I used a #5 curette we are able to get most of the soft. I continued with the silver nitrate to the much smaller wound on the right medial ankle she had a new area on her right lower pretibial area which she says was due to a role in her compression 03/05/2018; both wound areas look healthy. Not much change in dimensions from last week. I continue to use silver nitrate and Hydrofera Blue. The patient saw Dr. Doren Custard of vein and vascular. He felt she had venous stasis ulcers. He felt based on her previous arteriogram she should have adequate circulation for healing. Also she has deep venous reflux but  really no significant correctable superficial venous reflux at this time. He felt we should continue with conservative management including leg elevation and compression 04/02/2018; since we last saw this woman about a month ago she had a fall apparently suffered a pelvic fracture. I did not look up the x-ray. Nevertheless because of pain she literally was bedbound for 2 weeks and had home health coming out to change the dressing. Somewhat predictably this is resulted in considerable improvement in both wound areas. The right is just about closed on the medial malleolus and the left is about half the size. 04/16/2018; both her wounds continue to go down in size. Using Hydrofera Blue. 05/07/18; both her wounds appeared to be improving especially on the right where it is almost closed. We are using Hydrofera Blue 05/14/2018; slightly worse this week with larger wounds. Surface on the left medial not quite as good. We have been using Hydrofera Blue 05/21/18; again the wounds are slightly larger. Left medial malleolus slightly larger with eschar around the circumference. We have been using Hydrofera Blue undergoing a wraps for a prolonged period of time. This got a lot better when she was more recumbent due to a fall and a back injury. I change the primary dressing the silver alginate today. She did not tolerate a 4 layer compression previously although I may need to bring this up with her next time 05/28/2018; area on the left medial malleolus again is slightly larger with more drainage.  Area on the right is roughly unchanged. She has a small area of folliculitis on the right medial just on the lower calf. This does not look ominous. 06/03/2018 left medial malleolus slightly smaller in a better looking surface. We used silver nitrate on this last time with silver alginate. The area on the right appears slightly smaller 1/10; left medial malleolus slightly smaller. Small open area on the right. We used  silver nitrate and silver alginate as of 2 weeks ago. We continue with the wound and compression. These got a lot better when she was off her feet 1/17; right medial malleolus wound is smaller. The left may be slightly smaller. Both surfaces look somewhat better. 1/24; both wounds are slightly smaller. Using silver alginate under Unna boots 1/31; both wounds appear smaller in fact the area on the right medial is just about closed. Surface eschar. We have been using silver alginate under Unna boots. The patient is less active now spends let much less time on her feet and I think this is contributed to the general improvement in the wound condition 2/7; both wounds appear smaller. I was hopeful the right medial would be closed however there there is still the same small open area. Slight amount of surface eschar on the left the dimensions are smaller there is eschar but the wound edges appear to be free. We have been using silver alginate under Unna boot's 2/14; both wounds once again measure smaller. Circumferential eschar on the left medial. We have been using silver alginate under Unna boots with gradual improvement 2/21; the area on the right medial malleolus has healed. The area on the left is smaller. We have been using silver alginate and Unna boots. We can discharge wrapping the right leg she has 20/30 stockings at home she will need to protect the scar tissue in this area 2/28; the area on the right medial malleolus remains closed the patient has a compression stocking. The area on the left is smaller. We have been using silver alginate and Unna boots. 3/6 the area on the right medial ankle remains closed. Good edema control noted she is using her own compression stocking. The area on the left medial ankle is smaller. We have been managing this with silver alginate and Unna boots which we will continue today. 3/13; the area on the right medial ankle remains closed and I'm declaring it  healed today. When necessary the left is about the same still a healthy-looking surface but no major change and wound area. No evidence of infection and using silver alginate under unna and generally making considerable improvement 3/27 the area on the right medial ankle remains closed the area on the left is about the same as last week. Certainly not any worse we have been using silver alginate under an Unna boot 4/3; the area on the right medial ankle remains closed per the patient. We did not look at this wound. The wound on the left medial ankle is about the same surface looks healthy we have been using silver alginate under an Unna boot 4/10; area on the right medial ankle remains closed per the patient. We did not look at this wound. The wound on the left medial ankle is slightly larger. The patient complains that the [facility] caused burning pain all week. She also told us that she was a lot more active this week. Changed her back to silver alginate 4/17; right medial ankle still closed per the patient. Left medial ankle is  slightly larger. Using silver alginate. She did not tolerate Hydrofera Blue on this area 4/24; right medial ankle remains closed we have not look at this. The left medial ankle continues to get larger today by about a centimeter. We have been using silver alginate under Unna boots. She complains about 4 layer compression as an alternative. She has been up on her feet working on her garden 5/8; right medial ankle remains closed we did not look at this. The left medial ankle has increased in size about 100%. We have been using silver alginate under Unna boots. She noted increased pain this week and was not surprised that the wound is deteriorated 5/15; no major change in SA however much less erythema ( one week of doxy ocellulitis). 5/22-63 year old female returns at 1 week to the clinic for left medial ankle wound for which we have been using silver alginate under 3  layer compression She was placed on DOXY at last visit - the wound is wider at this visit. She is in 3 layer compression 5/29; change to [facility] last week. I had given her empiric doxycycline 2 weeks ago for a week. She is in 3 layer compression. She complains of a lot of pain and drainage on presentation today. 6/5; using Hydrofera Blue. I gave her doxycycline recently empirically for erythema and pain around the wound. Believe her cultures showed enterococcus which not would not have been well covered by doxycycline nevertheless the wound looks better and I don't feel specifically that the enterococcus needs to be covered. She has a new what looks like a wrap injury on her lateral left ankle. 6/12; she is using Hydrofera Blue. She has a new area on the left anterior lower tibial area. This was a wrap injury last week. 6/19; the patient is using Hydrofera Blue. She arrived with marked inflammation and erythema around the wound and tenderness. 12/01/18 on evaluation today patient appears to be doing a little bit better based on what I'm hearing from the standpoint of lassos evaluation to this as far as the overall appearance of the wound is concerned. Then sometime substandard she typically sees Dr. Dellia Nims. Nonetheless overall very pleased with the progress that she's made up to this point. No fevers, chills, nausea, or vomiting noted at this time. 7/10; some improvement in the surface area. Aggressively debrided last week apparently. I went ahead with the debridement today although the patient does not tolerate this very well. We have been using Iodoflex. Still a fair amount of drainage 7/17; slightly smaller. Using Iodoflex. 7/24; no change from last week in terms of surface area. We have been using Iodoflex. Surface looks and continues to look somewhat better 7/31; surface area slightly smaller better looking surface. We have been using Iodoflex. This is under Unna  boot compression 8/7-Patient presents at 1 week with Unna boot and Iodoflex, wound appears better 8/14-Patient presents at 1 week with Iodoflex, we use the Unna boot, wound appears to be stable better.Patient is getting Botox treatment for the inversion of the foot for tendon release, Next week 8/21; we are using Iodoflex. Unna boot. The wound is stable in terms of surface area. Under illumination there is some areas of the wound that appear to be either epithelialized or perhaps this is adherent slough at this point I was not really clear. It did not wipe off and I was reluctant to debride this today. 8/28; we are using Iodoflex in an Unna boot. Seems to be making good improvement.  9/4; using Iodoflex and wound is slightly smaller. 9/18; we are using Iodoflex with topical silver nitrate when she is here. The wound continues to be smaller 10/2; patient missed her appointment last week due to GI issues. She left and Iodoflex based dressing on for 2 weeks. Wound is about the same size about the size of a dime on the left medial lower 10/9 we have been using Iodoflex on the medial left ankle wound. She has a new superficial probable wrap injury on the dorsal left ankle 10/16; we have been using Hydrofera Blue since last week. This is on the left medial ankle Electronic Signature(s) Signed: 03/18/2019 5:33:39 PM By: Linton Ham MD Entered By: Linton Ham on 03/18/2019 11:36:18 -------------------------------------------------------------------------------- Physician Orders Details Patient Name: Date of Service: [name] 03/18/2019 10:00 AM Medical Record [id number] Patient Account Number: [id number] Date of Birth/Sex: Treating RN: 1955/08/19 (63 y.o. 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 Dressing Change Frequency Wound #3 Left,Medial Malleolus Do not change entire dressing for one week. Skin Barriers/Peri-Wound Care Moisturizing lotion TCA Cream or Ointment - mixed with lotion Wound Cleansing Clean wound with Wound Cleanser - clean with anasept with dressing changes May shower with protection. Primary Wound Dressing Wound #3 Left,Medial Malleolus Hydrofera Blue Secondary Dressing Wound #3 Left,Medial Malleolus Dry Gauze Kerramax Edema Control Unna Boot to Left Lower Extremity - no kerlix layer Avoid standing for long periods of time Elevate legs to the level of the heart or above for 30 minutes daily and/or when sitting, a frequency of: - throughout the day Support Garment 20-30 mm/Hg pressure to: - compression stocking right leg daily Electronic Signature(s) Signed: 03/18/2019 5:33:39 PM By: Linton Ham MD Signed: 03/18/2019 6:00:55 PM By: Levan Hurst RN, BSN Entered By: Levan Hurst on 03/18/2019 11:04:43 -------------------------------------------------------------------------------- Problem List Details Patient Name: Date of Service: [name] 03/18/2019 10:00 AM Medical Record [id number] Patient Account Number: [id number] Date of Birth/Sex: Treating RN: [birth year] (63 y.o. 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 11/16/2014 No Yes of calf and inflammation L97.321 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 03/11/2019 No Yes breakdown of skin Inactive Problems ICD-10 Code Description Active Date Inactive Date I83.223 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 08/03/2014 08/03/2014 and inflammation L03.116 Cellulitis of left lower limb 09/07/2014 09/07/2014 Resolved Problems ICD-10 Code Description Active Date Resolved Date L97.312 Non-pressure chronic ulcer of right ankle with fat layer 04/10/2016 04/10/2016 exposed Electronic Signature(s) Signed: 03/18/2019 5:33:39 PM By: Linton Ham MD Entered By: Linton Ham on 03/18/2019 11:34:20 -------------------------------------------------------------------------------- Progress Note Details Patient Name: Date of Service: [name] 03/18/2019 10:00 AM Medical Record [id number] Patient Account Number: [id number] Date of Birth/Sex: Treating RN: Dec 08, 1955 (63 y.o. [name] Primary Care Provider: [name] Other  Clinician: [name] Referring Provider: Treating Provider/Extender:Shloka Baldridge, Velva Harman, Essie Christine in Treatment: [phone] Subjective History of Present Illness (HPI) the remaining wound is over the left medial ankle. Similar wound over the right medial ankle healed largely with use of Apligraf. Most recently we have been using Hydrofera Blue over this wound with considerable improvement. The patient has been extensively worked up in the past for her venous insufficiency and she is not a candidate for antireflux surgery although I have none of the details available currently. 08/24/14; considerable improvement today. About 50% of this wound areas now epithelialized. The base of the wound appears to be healthier granulation.as opposed to last week when she had  deteriorated a considerable improvement 08/17/14; unfortunately the wound has regressed somewhat. The areas of epithelialization from the superior aspect are not nearly as healthy as they were last week. The patient thinks her Hydrofera Blue slipped. 09/07/14; unfortunately the area has markedly regressed in the 2 weeks since I've seen this. There is an odor surrounding erythema. The healthy granulation tissue that we had at the base of the wound now is a dusky color. The nurse reports green drainage 09/14/14; the area looks somewhat better than last week. There is less erythema and less drainage. The culture I did did not show any growth. Nevertheless I think it is better to continue the Cipro and doxycycline for a further week. The remaining wound area was debridement. 09/21/14. Wound did not require debridement last week. Still less erythema and less drainage. She can complete her antibiotics. The areas of epithelialization in the superior aspect of the wound do not look as healthy as they did some weeks ago 10/05/14 continued improvement in the condition of this wound. There is advancing epithelialization. Less aggressive debridement required 10/19/14 continued improvement in the condition and volume of this wound. Less aggressive debridement to the inferior part of this to remove surface slough and fibrinous eschar 11/02/14 no debridement is required. The surface granulation appears healthy although some of her islands of epithelialization seem to have regressed. No evidence of infection 11/16/14; lites surface debridement done of surface eschar. The wound does not look to be unhealthy. No evidence of infection. Unfortunately the patient has had podiatry issues in the right foot and for some reason has redeveloped small surface ulcerations in the medial right ankle. Her original presentation involved wounds in this area 11/23/14 no debridement. The area on the right ankle has enlarged. The left ankle  wound appears stable in terms of the surface although there is periwound inflammation. There has been regression in the amount of new skin 11/30/14 no debridement. Both wound areas appear healthy. There was no evidence of infection. The the new area on the right medial ankle has enlarged although that both the surfaces appear to be stable. 12/07/14; Debridement of the right medial ankle wound. No no debridement was done on the left. 12/14/14 no major change in and now bilateral medial ankle wounds. Both of these are very painful but the no overt evidence of infection. She has had previous venous ablation 12/21/14; patient states that her right medial ankle wound is considerably more painful last week than usual. Her left is also somewhat painful. She could not tolerate debridement. The right medial ankle wound has fibrinous surface eschar 12/28/14 this is a patient with severe bilateral venous insufficiency ulcers. For a considerable period of time we actually had the one on the right medial ankle healed however this recently opened up again in June. The  left medial ankle wound has been a refractory area with some absent flows. We had some success with Hydrofera Blue on this area and it literally closed by 50% however it is recently opened up Foley. Both of these were debridement today of surface eschar. She tolerates this poorly 01/25/15: No change in the status of this. Thick adherent escar. Very poor tolerance of any attempt at debridement. I had healed the right medial malleolus wound for a considerable amount of time and had the left one down to about 50% of the volume although this is totally regressed over the last 48 weeks. Further the right leg has reopened. she is trying to make a appointment with pain and vascular, previous ablations with Dr. Aleda Grana. I do not believe there is an arterial insufficiency issue here 02/01/15 the status of the adherent eschar bilaterally is actually improved.  No debridement was done. She did not manage to get vascular studies done 02/08/15 continued debridement of the area was done today. The slough is less adherent and comes off with less pressure. There is no surrounding infection peripheral pulses are intact 02/15/15 selective debridement with a disposable curette. Again the slough is less adherent and comes off with less difficulty. No surrounding infection peripheral pulses are intact. 02/22/15 selective debridement of the right medial ankle wound. Slough comes off with less difficulty. No obvious surrounding infection peripheral pulses are intact I did not debridement the one on the left. Both of these are stable to improved 03/01/15 selective debridement of both wound areas using a curette to. Adherent slough cup soft with less difficulty. No obvious surrounding infection. The patient tells me that 2 days ago she noted a rash above the right leg wrap. She did not have this on her lower legs when she change this over she arrives with widespread left greater than right almost folliculitis-looking rash which is extremely pruritic. I don't see anything to culture here. There is no rash on the rest of her body. She feels well systemically. 03/08/15; selective debridement of both wounds using a curette. Base of this does not look unhealthy. She had limegreen drainage coming out of the left leg wound and describes a lot of drainage. The rash on her left leg looks improved to. No cultures were done. 03/22/15; patient was not here last week. Basal wounds does not look healthy and there is no surrounding erythema. No drainage. There is still a rash on the left leg that almost looks vasculitic however it is clearly limited to the top of where the wrap would be. 04/05/15; on the right required a surgical debridement of surface eschar and necrotic subcutaneous tissue. I did not debridement the area on the left. These continue to be large open wounds that are not  changing that much. We were successful at one point in healing the area on the right, and at the same time the area on the left was roughly half the size of current measurements. I think a lot of the deterioration has to do with the prolonged time the patient is on her feet at work 04/19/15 I attempted-like surface debridement bilaterally she does not tolerate this. She tells me that she was in allergic care yesterday with extreme pain over her left lateral malleolus/ankle and was told that she has an "sprain" 05/03/15; large bilateral venous insufficiency wounds over the medial malleolus/medial aspect of her ankles. She complains of copious amounts of drainage and his usual large amounts of pain. There is some increasing erythema  around the wound on the right extending into the medial aspect of her foot to. historically she came in with these wounds the right one healed and the left one came down to roughly half its current size however the right one is reopened and the left is expanded. 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 05/10/15 large bilateral venous insufficiency wounds. There is less adherence surface left however the surface culture that I did last week grew pseudomonas therefore bilateral selective debridement score necessary. There is surrounding erythema. The patient describes severe bilateral drainage and a lot of pain in the left ankle. Apparently her podiatrist was were ready to do a cortisone shot 05/17/15; the patient complains of pain and again copious amounts of drainage. 05/24/15; we used Iodo flex last week. Patient notes considerable improvement in wound drainage. Only needed to change this once. 05/31/15; we continued Iodoflex; the base of these large wounds bilaterally is not too bad but there is probably likely a significant bioburden here. I would like to debridement just doesn't tolerate it. 06/06/14 I would like to  continue the Iodoflex although she still hasn't managed to obtain supplies. She has bilateral medial malleoli or large wounds which are mostly superficial. Both of them are covered circumferentially with some nonviable fibrinous slough although she tolerates debridement very poorly. She apparently has an appointment for an ablation on the right leg by interventional radiology. 06/14/15; the patient arrives with the wounds and static condition. We attempted a debridement although she does not do well with this secondary to pain. I 07/05/15; wounds are not much smaller however there appears to be a cleaner granulating base. The left has tight fibrinous slough greater than the right. Debridement is tolerated poorly due to pain. Iodoflex is done more for these wounds in any of the multitude of different dressings I have tried on the left 1 and then subsequently the right. 07/12/15; no change in the condition of this wound. I am able to do an aggressive debridement on the right but not the left. She simply cannot tolerate it. We have been using Iodoflex which helps somewhat. It is worthwhile remembering that at one point we healed the right medial ankle wound and the left was about 25% of the current circumference. We have suggested returning to vascular surgery for review of possible further ablations for one reason or another she has not been able to do this. 07/26/15 no major change in the condition of either wound on her medial ankle. I did not attempt to debridement of these. She has been aggressively scrubbing these while she is in the shower at home. She has her supply of Iodoflex which seems to have done more for these wounds then anything I have put on recently. 08/09/15 wound area appears larger although not verified by measurements. Using Iodoflex 09/05/2015 -- she was here for avisit today but had significant problems with the wound and I was asked to see her for a physician opinion. I have  summarize that this lady has had surgery on her left lower extremity about 10 years ago where the possible veins stripping was done. She has had an opinion from interventional radiology around November 2016 where no further sclerotherapy was ordered. The patient works 12 hours a day and stands on a concrete floor with work boots and is unable to get the proper compression she requires and cannot elevate her limbs appropriately at  any given time. She has recently grown Pseudomonas from her wound culture but has not started her ciprofloxacin which was called in for her. 09/13/15 this continues to be a difficult situation for this patient. At one point I had this wound down to a 1.5 x 1.5" wound on her left leg. This is deteriorated and the right leg has reopened. She now has substantial wounds on her medial calcaneus, malleoli and into her lower leg. One on the left has surface eschar but these are far too painful for me to debridement here. She has a vascular surgery appointment next week to see if anything can be done to help here. I think she has had previous ablations several years ago at [facility]. She has no major edema. She tells me that she did not get product last time [facility] and went for several days without it. She continues to work in work boots 12 hours a day. She cannot get compression/4-layer under her work boots. 09/20/15 no major change. Periwound edema control was not very good. Her point with pain and vascular is next Wednesday the 25th 09/28/15; the patient is seen vascular surgery and is apparently scheduled for repeat duplex ultrasounds of her bilateral lower legs next week. 10/05/15; the patient was seen by Dr. Doren Custard of vascular surgery. He feels that she should have arterial insufficiency excluded as cause/contributed to her nonhealing stage she is therefore booked for an arteriogram. She has apparently monophasic signals in the dorsalis pedis pulses. She also of course  has known severe chronic venous insufficiency with previous procedures as noted previously. I had another long discussion with the patient today about her continuing to work 12 hour shifts. I've written her out for 2 months area had concerns about this as her work location is currently undergoing significant turmoil and this may lead to her termination. She is aware of this however I agree with her that she simply cannot continue to stand for 12 hours multiple days a week with the substantial wound areas she has. 10/19/15; the Dr. Doren Custard appointment was largely for an arteriogram which was normal. She does not have an arterial issue. He didn't make a comment about her chronic venous insufficiency for which she has had previous ablations. Presumably it was not felt that anything additional could be done. The patient is now out of work as I prescribed 2 weeks ago. Her wounds look somewhat less aggravated presumably because of this. I felt I would give debridement another try today 10/25/15; no major change in this patient's wounds. We are struggling to get her product that she can afford into her own home through her insurance. 11/01/15; no major change in the patient's wounds. I have been using silver alginate as the most affordable product. I spoke to Dr. Marla Roe last week with her requested take her to the OR for surgical debridement and placement of ACEL. Dr. Marla Roe told me that she would be willing to do this however [facility] will not cover this, fortunately the patient has [place] healthcare of some variant 11/08/15; no major change in the patient's wounds. She has been completely nonviable surface that this but is in too much pain with any attempted debridement are clinic. I have arranged for her to see Dr. Marla Roe ham of plastic surgery and this appointment is on Monday. I am hopeful that they will take her to the OR for debridement, possible ACEL ultimately possible skin  graft 11/22/15 no major change in the  patient's wounds over her bilateral medial calcaneus medial malleolus into the lower legs. Surface on these does not look too bad however on the left there is surrounding erythema and tenderness. This may be cellulitis or could him sleepy tinea. 11/29/15; no major changes in the patient's wounds over her bilateral medial malleolus. There is no infection here and I don't think any additional antibiotics are necessary. There is now plan to move forward. She sees Dr. Marla Roe in a week's time for preparation for operative debridement and ACEL placement I believe on 7/12. She then has a follow-up appointment with Dr. Marla Roe on 7/21 12/28/15; the patient returns today having been taken to the [place] by Dr. Marla Roe 12/12/15 she underwent debridement, intraoperative cultures [which were negative]. She had placement of a wound VAC. Parent really ACEL was not available to be placed. The wound VAC foam apparently adhered to the wound since then she's been using silver alginate, Xeroform under Ace wraps. She still says there is a lot of drainage and a lot of pain 01/31/16; this is a patient I see monthly. I had referred her to Dr. Marla Roe him of plastic surgery for large wounds on her bilateral medial ankles. She has been to the OR twice once in early July and once in early August. She tells me over the last 3 weeks she has been using the wound VAC with ACEL underneath it. On the right we've simply been using silver alginate. Under Kerlix Coban wraps. 02/28/16; this is a patient I'm currently seeing monthly. She is gone on to have a skin graft over her large venous insufficiency ulcer on the left medial ankle. This was done by Dr. Marla Roe him. The patient is a bit perturbed about why she didn't have one on her right medial ankle wound. She has been using silver alginate to this. 03/06/16; I received a phone call from her plastic surgery Dr. Marla Roe. She expressed  some concern about the viability of the skin graft she did on the left medial ankle wound. Asked me to place Endoform on this. She told me she is not planning to do a subsequent skin graft on the right as the left one did not take very well. I had placed Hydrofera Blue on the right 03/13/16; continue to have a reasonably healthy wound on the right medial ankle. Down to 3 mm in terms of size. There is epithelialization here. The area on the left medial ankle is her skin graft site. I suppose the last week this looks somewhat better. She has an open area inferiorly however in the center there appears to be some viable tissue. There is a lot of surface callus and eschar that will eventually need to come off however none of this looked to be infected. Patient states that the is able to keep the dressing on for several days which is an improvement. 03/20/16 no major change in the circumference of either wound however on the left side the patient was at Dr. Eusebio Friendly office and they did a debridement of left wound. 50% of the wound seems to be epithelialized. I been using Endoform on the left Hydrofera Blue in the right 03/27/16; she arrives today with her wound is not looking as healthy as they did last week. The area on the right clearly has an adherent surface to this a very similar surface on the left. Unfortunately for this patient this is all too familiar problem. Clearly the Endoform is not working and will need to change that  today that has some potential to help this surface. She does not tolerate debridement in this clinic very well. She is changing the dressing wants 04/03/16; patient arrives with the wounds looking somewhat better especially on the right. Dr. Migdalia Dk change the dressing to silver alginate when she saw her on Monday and also sold her some compression socks. The usefulness of the latter is really not clear and woman with severely draining wounds. 04/10/16; the patient is doing  a bit of an experiment wearing the compression stockings that Dr. Migdalia Dk provided her to her left leg and the out of legs based dressings that we provided to the right. 05/01/16; the patient is continuing to wear compression stockings Dr. Migdalia Dk provided her on the left that are apparently silver impregnated. She has been using Iodoflex to the right leg wound. Still a moderate amount of drainage, when she leaves here the wraps only last for 4 days. She has to change the stocking on the left leg every night 05/15/16; she is now using compression stockings bilaterally provided by Dr. Marla Roe. She is wearing a nonadherent layer over the wounds so really I don't think there is anything specific being done to this now. She has some reduction on the left wound. The right is stable. I think all healing here is being done without a specific dressing 06/09/16; patient arrives here today with not much change in the wound certainly in diameter to large circular wounds over the medial aspect of her ankle bilaterally. Under the light of these services are certainly not viable for healing. There is no evidence of surrounding infection. She is wearing compression stockings with some sort of silver impregnation as prescribed by Dr. Marla Roe. She has a follow-up with her tomorrow. 06/30/16; no major change in the size or condition of her wounds. These are still probably covered with a nonviable surface. She is using only her purchase stockings. She did see Dr. Marla Roe who seemed to want to apply Dakin's solution to this I'm not extreme short what value this would be. I would suggest Iodoflex which she still has at home. 07/28/16; I follow Mrs. Yassin episodically along with Dr. Marla Roe. 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 She has been applying a topical collagen ointment to both wounds with Adaptic. I don't think Dr. Marla Roe is  planning to take her back to the OR. 08/19/16; I follow Mrs. Jeneen Rinks on a monthly basis along with Dr. Marla Roe of plastic surgery. 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 I been following her for a number of years. At one point I was able to get the right medial malleolus wound to heal and had the left medial malleolus down to about half its current size however and I had to send her to plastic surgery for an operative debridement. Since then things have been stable to slightly improve the area on the right is slightly better one in the left about the same although there is much less adherent surface than I'm used to with this patient. She is using some form of liquid collagen gel that Dr. Marla Roe provided a Kerlix cover with the patient's own pressure stockings. She tells me that she has extreme pain in both ankles and along the lateral aspect of both feet. She has been unable to work for some period of time. She is telling me she is retiring at the beginning of  April. She sees Dr. Doran Durand of orthopedics next week 09/22/16; patient has not seen Dr. Marla Roe since the last time she is here. I'm not really sure what she is using to the wounds other than bits and pieces of think she had left over including most recently Hydrofera Blue. She is using juxtalite stockings. She is having difficulty with her husband's recent illness "stroke". She is having to transport him to various doctors appointments. Dr. Marla Roe left her the option of a repeat debridement with ACEL however she has not been able to get the time to follow-up on this. She continues to have a fair amount of drainage out of these wounds with certainly precludes leaving dressings on all week 10/13/16; patient has not seen Dr. Marla Roe since she was last in our clinic. I'm not really sure what she is doing with the wounds, we did try to get her [facility] and  I think she is actually using this most of the time. Because of drainage she states she has to change this every second day although this is an improvement from what she used to do. She went to see Dr. Doran Durand who did not think she had a muscular issue with regards to her feet, he referred her to a neurologist and I think the appointment is sometime in June. I changed her back to Iodoflex which she has used in the past but not recently. 11/03/16; the patient has been using Iodoflex although she ran out of this. Still claims that there is a lot of drainage although the wound does not look like this. No surrounding erythema. She has not been back to see Dr. Marla Roe 11/24/16; the patient has been using Iodoflex again but she ran out of it 2 or 3 days ago. There is no major change in the condition of either one of these wounds in fact they are larger and covered in a thick adherent surface slough/nonviable tissue especially on the left. She does not tolerate mechanical debridement in our clinic. Going back to see Dr. Marla Roe of plastic surgery for an operative debridement would seem reasonable. 12/15/16; the patient has not been back to see Dr. Marla Roe. She is been dealing with a series of illnesses and her husband which of monopolized her time. She is been using Sorbact which we largely supplied. She states the drainage is bad enough that it maximum she can go 2-3 days without changing the dressing 01/12/2017 -- the patient has not been back for about 4 weeks and has not seen Dr. Marla Roe not does she have any appointment pending. 01/23/17; patient has not seen Dr. Marla Roe even though I suggested this previously. She is using Santyl that was suggested last week by Dr. Con Memos this Cost her $16 through her insurance which is indeed surprising 02/12/17; continuing Santyl and the patient is changing this daily. A lot of drainage. She has not been back to see plastic surgery she is using an Ace  wrap. Our intake nurse suggested wrap around stockings which would make a good reasonable alternative 02/26/17; patient is been using Santyl and changing this daily due to drainage. She has not been to see plastic surgery she uses in April Ace wrap to control the edema. She did obtain extremitease stockings but stated that the edema in her leg was to big for these 03/20/17; patient is using Santyl and Anasept. Surfaces looked better today the area on the right is actually measuring a little smaller. She has states she has a lot  of pain in her feet and ankles and is asking for a consult to pain control which I'll try to help her with through our case manager. 04/10/17; the patient arrives with better-looking wound surfaces and is slightly smaller wound on the left she is using a combination of Santyl and Anasept. She has an appointment or at least as started in the pain control center associated with [facility] 05/14/17; this is a patient who I followed for a prolonged period of time. 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 At one point I had this down to a much smaller wound on the left however these reopened and we've never been able to get these to heal. She has been using Santyl and Anasept gel although 2 weeks ago she ran out of the Anasept gel. She has a stable appearance of the wound. She is going to the wound care clinic at [facility]. They wanted do a nerve block/spinal block although she tells me she is reluctant to go forward with that. 05/21/17; this is a patient I have followed for many years. 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 Chronic pain and deformity in her ankles as well. She is been to see plastic surgery as well as orthopedics. Using PolyMem AG most recently/Kerramax/ABDs and 2 layer compression. She has managed to keep this on and she is coming in for a nurse check to change the dressing on Tuesdays, we  see her on Fridays 06/05/17; really quite a good looking surface and the area especially on the right medial has contracted in terms of dimensions. Well granulated healthy-looking tissue on both sides. Even with an open curet there is nothing that even feels abnormal here. This is as good as I've seen this in quite some time. We have been using PolyMem AG and bringing her in for a nurse check 06/12/17; really quite good surface on both of these wounds. The right medial has contracted a bit left is not. We've been using PolyMem and AG and she is coming in for a nurse visit 06/19/17; we have been using PolyMem AG and bringing her in for a nurse check. Dimensions of her wounds are not better but the surfaces looked better bilaterally. She complained of bleeding last night and the left wound and increasing pain bilaterally. She states her wound pain is more neuropathic than just the wounds. There was some suggestion that this was radicular from her pain management doctor in talking to her it is really difficult to sort this out. 06/26/17; using PolyMem and AG and bringing her in for a nurse check as All of this and reasonably stable condition. Certainly not improved. The dimensions on the lateral part of the right leg look better but not really measuring better. The medial aspect on the left is about the same. 07/03/16; we have been using PolyMen AG and bringing her in for a nurse check to change the dressings as the wounds have drainage which precludes once weekly changing. We are using all secondary absorptive dressings.our intake nurse is brought up the idea of using a wound VAC/snap VAC on the wound to help with the drainage to see if this would result in some contraction. This is not a bad idea. The area on the right medial is actually looking smaller. Both wounds have a reasonable-looking surface. There is no evidence of cellulitis. The edema is well controlled 07/10/17; the patient was denied for a  snap VAC by her  insurance. The major issue with these wounds continues to be drainage. We are using wicked PolyMem AG and she is coming in for a nurse visit to change this. The wounds are stable to slightly improved. The surface looks vibrant and the area on the right certainly has shrunk in size but very slowly 07/17/17; the patient still has large wounds on her bilateral medial malleoli. Surface of both of these wounds looks better. The dimensions seem to come and go but no consistent improvement. There is no epithelialization. We do not have options for advanced treatment products due to insurance issues. They did not approve of the wound VAC to help control the drainage. More recently we've been using PolyMem and AG wicked to allow drainage through. We have been bringing her in for a nurse visit to change this. We do not have a lot of options for wound care products and the home again due to insurance issues 07/24/17; the patient's wound actually looks somewhat better today. No drainage measurements are smaller still healthy-looking surface. We used silver collagen under PolyMen started last week. We have been bringing her in for a dressing change 07/31/17; patient's wound surface continued to look better and I think there is visible change in the dimensions of the wound on the right. Rims of epithelialization. We have been using silver collagen under PolyMen and bringing her in for a dressing change. There appears to be less drainage although she is still in need of the dressing change 08/07/17. Patient's wound surface continues to look better on both sides and the area on the right is definitely smaller. We have been using silver collagen and PolyMen. She feels that the drainage has been it has been better. I asked her about her vascular status. She went to see Dr. Aleda Grana at [facility] and had some form of ablation. I don't have much detail on this. I haven't my notes from 2016 that she  was not a candidate for any further ablation but I don't have any more information on this. We had referred her to vein and vascular I don't think she ever went. He does not have a history of PAD although I don't have any information on this either. We don't even have ABIs in our record 08/14/17; we've been using silver collagen and PolyMen cover. And putting the patient and compression. She we are bringing her in as a nurse visit to change this because ofarge amount of drainage. We didn't the ABIs in clinic today since they had been done in many moons 1.2 bilaterally. She has been to see vein and vascular however this was at [facility] and she had ablation although I really don't have any information on this all seemed biking get a report. She is also been operatively debrided by plastic surgery and had a cell placed probably 8-12 months ago. This didn't have a major effect. We've been making some gains with current dressings 08/19/17-She is here in follow-up evaluation for bilateral medial malleoli ulcers. She continues to tolerate debridement very poorly. We will continue with recently changed topical treatment; if no significant improvement may consider switching to Iodosorb/Iodoflex. 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 She has been using silver collagen and PolyMem. I believe she has been used and tried on Iodoflex before. During her trip to the clinic we've been watching her wound with Anasept spray and I would like to encourage this on thenurse visit days 09/04/17 bilateral medial malleoli  ulcers area is her chronic related to chronic venous insufficiency. These have been very refractory over time. We have been using silver collagen and PolyMen. She is coming in once a week for a doctor's and once a week for nurse visits. We are actually making some progress 09/18/17; the patient's wounds are smaller especially on the right medial. She  arrives today to upset to consider even washing these off with Anasept which I think is been part of the reason this is been closing. We've been using collagen covered in PolyMen otherwise. It is noted that she has a small area of folliculitis on the right medial calf that. As we are wrapping her legs I'll give her a short course of doxycycline to make sure this doesn't amount to anything. She is a long list of complaints today including imbalance, shortness of breath on exertion, inversion of her left ankle. With regards to the latter complaints she is been to see orthopedics and they offered her a tendon release surgery I believe but wanted her wounds to be closed first. I have recommended she go see her primary physician with regards to everything else. 09/25/17; patient's wounds are about the same size. We have made some progress bilaterally although not in recent weeks. She will not allow me T wash these wounds with Anasept even if she is doing her cell. Wheeze we've been using collagen covered in PolyMen. Last week she had a small area of folliculitis this is now opened into a small wound. She completed 5 days of trimethoprim sulfamethoxazole 10/02/17; unfortunately the area on her left medial ankle is worse with a larger wound area towards the Achilles. The patient complains of a lot of pain. She will not allow debridement although visually I don't think there is anything to debridement in any case. We have been using silver collagen and PolyMen for several months now. Initially we are making some progress although I'm not really seeing that today. We will move back to [facility]. His admittedly this is a bit of a repeat however I'm hoping that his situation is different now. The patient tells me she had her leg on the left give out on her yesterday this is process some pain. 10/09/17; the patient is seen twice a week largely because of drainage issues coming out of the chronic  medial bimalleolar wounds that are chronic. Last week the dimensions of the one on the left looks a little larger I changed her to [facility]. She comes in today with a history of terrible pain in the bilateral wound areas. She will not allow debridement. She will not even allow a tissue culture. There is no surrounding erythema no no evidence of cellulitis. We have been putting her Kerlix Coban man. She will not allow more aggressive compression as there was a suggestion to put her in 3 layer wraps. 10/16/17; large wounds on her bilateral medial malleoli. These are chronic. Not much change from last week. The surface looks have healthy but absolutely no epithelialization. A lot of pain little less so of drainage. She will not allow debridement or even washing these off in the vigorous fashion with Anasept. 10/23/17; large wounds on her bilateral malleoli which are chronic. Some improvement in terms of size perhaps on the right since last time I saw these. She states that after we increased the 3 layer compression there was some bleeding, when she came in for a nurse visit she did not want 3 layer compression put back  on about our nurse managed to convince her. She has known chronic venous visit issues and I'm hoping to get her to tolerate the 3 layer compression. using Hydrofera Blue 10/30/17; absolutely no change in the condition of either wound although we've had some improvement in dimensions on the right.. Attempted to put her in 3 layer compression she didn't tolerated she is back in 2 layer compression. We've been using Hydrofera Blue We looked over her past records. She had venous reflux studies in November 2016. There was no evidence of deep venous reflux on the right. Superficial vein did not show the greater saphenous vein at think this is been previously ablated the small saphenous vein was within normal limits. The left deep venous system showed no DVT the vessels were positive for  deep venous reflux in the posterior tibial veins at the ankle. The greater saphenous vein was surgically absent small saphenous vein was within normal limits. She went to vein and vascular at [facility]. I believe she had an ablation on the left greater saphenous vein. I'll update her reflux studies perhaps ever reviewed by vein and vascular. We've made absolutely no progress in these wounds. Will also try to read and TheraSkins through her insurance 11/06/17; W the patient apparently has a 2 week follow-up with vein and vascular I like him to review the whole issue with regards to her previous vascular workup by Dr. Aleda Grana. We've really made no progress on these wounds in many months. She arrives today with less viable looking surface on the left medial ankle wound. This was apparently looking about the same on Tuesday when she was here for nurse visit. 11/13/17; deep tissue culture I did last time of the left lower leg showed multiple organisms without any predominating. In particular no Staphylococcus or group A strep were isolated. We sent her for venous reflux studies. She's had a previous left greater saphenous vein stripping and I think sclerotherapy of the right greater saphenous vein. She didn't really look at the lesser saphenous vein this both wounds are on the medial aspect. She has reflux in the common femoral vein and popliteal vein and an accessory vein on the right and the common femoral vein and popliteal vein on the left. I'm going to have her go to see vein and vascular just the look over things and see if anything else beside aggressive compression is indicated here. We have not been able to make any progress on these wounds in spite of the fact that the surface of the wounds is never look too bad. 11/20/17; no major change in the condition of the wounds. Patient reports a large amount of drainage. She has a lot of complaints of pain although enlisting her today I wonder if  some of this at least his neuropathic rather than secondary to her wounds. She has an appointment with vein and vascular on 12/30/17. The refractory nature of these wounds in my mind at least need vein and vascular to look over the wounds the recent reflux studies we did and her history to see if anything further can be done here. I also note her gait is deteriorated quite a bit. 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 She has a bilateral Trendelenburg gait. I wonder if this is neuropathic or perhaps multilevel radicular. 11/27/17; her wounds actually looks slightly better. Healthy-looking granulation tissue a scant amount of epithelialization. [place] healthcare will not pay for [employer]. They will play for tri layer  Oasis and Dermagraft. This is not a diabetic ulcer. We'll try for the tri layer Oasis. She still complains of some drainage. She has a vein and vascular appointment on 12/30/17 12/04/17; the wounds visually look quite good. Healthy-looking granulation with some degree of epithelialization. We are still waiting for response to our request for trial to try layer Oasis. Her appointment with vascular to review venous and arterial issues isn't sold the end of July 7/31. Not allow debridement or even vigorous cleansing of the wound surface. 12/18/17; slightly smaller especially on the right. 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 We've been using Hydrofera Blue. We still are looking into triple layer Oasis through her insurance 01/08/18 on evaluation today patient's wound actually appears to be showing signs of good improvement at this point in time. She has been tolerating the dressing changes without complication. Fortunately there does not appear to be any evidence of infection at this point in time. We have been utilizing silver nitrate which does seem to be of benefit for her which is also good news. Overall I'm very happy with how things seem to  be both regards appearance as well as measurement. Patient did see Dr. Bridgett Larsson for evaluation on 12/30/17. In his assessment he felt that stripping would not likely add much more than chronic compression to the patient's healing process. His recommendation was to follow-up in three months with Dr. Doren Custard if she hasn't healed in order to consider referral back to you and see vascular where she previously was in a trial and was able to get her wound to heal. I'll be see what she feels she when you staying compression and he reiterated this as well. 01/13/18 on evaluation today patient appears to actually be doing very well in regard to her bilateral medial malleolus ulcers. She seems to have tolerated the chemical cauterization with silver nitrate last week she did have some pain through that evening but fortunately states that I'll be see since it seems to be doing better she is overall pleased with the progress. 01/21/18; really quite a remarkable improvement since I've last seen these wounds. We started using silver nitrate specially on the islands of hyper granulation which for some reason her around the wound circumference. This is really done quite nicely. Primary dressing Hydrofera Blue under 4 layer compression. She seems to be able to hold out without a nurse rewrap. Follow-up in 1 week 01/28/18; we've continued the hydrofera blue but continued with chemical cauterization to the wound area that we started about a month ago for irregular hyper granulation. She is made almost stunning improvement in the overall wound dimensions. I was not really expecting this degree of improvement in these chronic wounds 02/05/18; we continue with Hydrofera Bluebut of also continued the aggressive chemical cauterization with silver nitrate. We made nice progress with the right greater than left wound. 02/12/18. We continued with Hydrofera Blue after aggressive chemical cauterization with silver nitrate. We appear  to be making nice progress with both wound areas 02/19/2018; we continue with [facility] after washing the wounds vigorously with Anasept spray and chemical cauterization with silver nitrate. We are making excellent progress. The area on the right's just about closed 02/26/2018. The area on the left medial ankle had too much necrotic debris today. I used a #5 curette we are able to get most of the soft. I continued with the silver nitrate to the much smaller wound on the right medial ankle she had a new area  on her right lower pretibial area which she says was due to a role in her compression 03/05/2018; both wound areas look healthy. Not much change in dimensions from last week. I continue to use silver nitrate and Hydrofera Blue. The patient saw Dr. Doren Custard of vein and vascular. He felt she had venous stasis ulcers. He felt based on her previous arteriogram she should have adequate circulation for healing. Also she has deep venous reflux but really no significant correctable superficial venous reflux at this time. He felt we should continue with conservative management including leg elevation and compression 04/02/2018; since we last saw this woman about a month ago she had a fall apparently suffered a pelvic fracture. I did not look up the x-ray. Nevertheless because of pain she literally was bedbound for 2 weeks and had home health coming out to change the dressing. Somewhat predictably this is resulted in considerable improvement in both wound areas. The right is just about closed on the medial malleolus and the left is about half the size. 04/16/2018; both her wounds continue to go down in size. Using Hydrofera Blue. 05/07/18; both her wounds appeared to be improving especially on the right where it is almost closed. We are using Hydrofera Blue 05/14/2018; slightly worse this week with larger wounds. Surface on the left medial not quite as good. We have been using Hydrofera Blue 05/21/18;  again the wounds are slightly larger. Left medial malleolus slightly larger with eschar around the circumference. We have been using Hydrofera Blue undergoing a wraps for a prolonged period of time. This got a lot better when she was more recumbent due to a fall and a back injury. I change the primary dressing the silver alginate today. She did not tolerate a 4 layer compression previously although I may need to bring this up with her next time 05/28/2018; area on the left medial malleolus again is slightly larger with more drainage. Area on the right is roughly unchanged. She has a small area of folliculitis on the right medial just on the lower calf. This does not look ominous. 06/03/2018 left medial malleolus slightly smaller in a better looking surface. We used silver nitrate on this last time with silver alginate. The area on the right appears slightly smaller 1/10; left medial malleolus slightly smaller. Small open area on the right. We used silver nitrate and silver alginate as of 2 weeks ago. We continue with the wound and compression. These got a lot better when she was off her feet 1/17; right medial malleolus wound is smaller. The left may be slightly smaller. Both surfaces look somewhat better. 1/24; both wounds are slightly smaller. Using silver alginate under Unna boots 1/31; both wounds appear smaller in fact the area on the right medial is just about closed. Surface eschar. We have been using silver alginate under Unna boots. The patient is less active now spends let much less time on her feet and I think this is contributed to the general improvement in the wound condition 2/7; both wounds appear smaller. I was hopeful the right medial would be closed however there there is still the same small open area. Slight amount of surface eschar on the left the dimensions are smaller there is eschar but the wound edges appear to be free. We have been using silver alginate under Unna  boot's 2/14; both wounds once again measure smaller. Circumferential eschar on the left medial. We have been using silver alginate under Unna boots with gradual improvement 2/21;  the area on the right medial malleolus has healed. The area on the left is smaller. We have been using silver alginate and Unna boots. We can discharge wrapping the right leg she has 20/30 stockings at home she will need to protect the scar tissue in this area 2/28; the area on the right medial malleolus remains closed the patient has a compression stocking. The area on the left is smaller. We have been using silver alginate and Unna boots. 3/6 the area on the right medial ankle remains closed. Good edema control noted she is using her own compression stocking. The area on the left medial ankle is smaller. We have been managing this with silver alginate and Unna boots which we will continue today. 3/13; the area on the right medial ankle remains closed and I'm declaring it healed today. When necessary the left is about the same still a healthy-looking surface but no major change and wound area. No evidence of infection and using silver alginate under unna and generally making considerable improvement 3/27 the area on the right medial ankle remains closed the area on the left is about the same as last week. Certainly not any worse we have been using silver alginate under an Unna boot 4/3; the area on the right medial ankle remains closed per the patient. We did not look at this wound. The wound on the left medial ankle is about the same surface looks healthy we have been using silver alginate under an Unna boot 4/10; area on the right medial ankle remains closed per the patient. We did not look at this wound. The wound on the left medial ankle is slightly larger. The patient complains that the [facility] caused burning pain all week. She also told us that she was a lot more active this week. Changed her back to silver  alginate 4/17; right medial ankle still closed per the patient. Left medial ankle is slightly larger. Using silver alginate. She did not tolerate Hydrofera Blue on this area 4/24; right medial ankle remains closed we have not look at this. The left medial ankle continues to get larger today by about a centimeter. We have been using silver alginate under Unna boots. She complains about 4 layer compression as an alternative. She has been up on her feet working on her garden 5/8; right medial ankle remains closed we did not look at this. The left medial ankle has increased in size about 100%. We have been using silver alginate under Unna boots. She noted increased pain this week and was not surprised that the wound is deteriorated 5/15; no major change in SA however much less erythema ( one week of doxy ocellulitis). 5/22-63 year old female returns at 1 week to the clinic for left medial ankle wound for which we have been using silver alginate under 3 layer compression She was placed on DOXY at last visit - the wound is wider at this visit. She is in 3 layer compression 5/29; change to [facility] last week. I had given her empiric doxycycline 2 weeks ago for a week. She is in 3 layer compression. She complains of a lot of pain and drainage on presentation today. 6/5; using Hydrofera Blue. I gave her doxycycline recently empirically for erythema and pain around the wound. Believe her cultures showed enterococcus which not would not have been well covered by doxycycline nevertheless the wound looks better and I don't feel specifically that the enterococcus needs to be covered. She has a new what  looks like a wrap injury on her lateral left ankle. 6/12; she is using Hydrofera Blue. She has a new area on the left anterior lower tibial area. This was a wrap injury last week. 6/19; the patient is using Hydrofera Blue. She arrived with marked inflammation and erythema around the wound and  tenderness. 12/01/18 on evaluation today patient appears to be doing a little bit better based on what I'm hearing from the standpoint of lassos evaluation to this as far as the overall appearance of the wound is concerned. Then sometime substandard she typically sees Dr. Dellia Nims. Nonetheless overall very pleased with the progress that she's made up to this point. No fevers, chills, nausea, or vomiting noted at this time. 7/10; some improvement in the surface area. Aggressively debrided last week apparently. I went ahead with the debridement today although the patient does not tolerate this very well. We have been using Iodoflex. Still a fair amount of drainage 7/17; slightly smaller. Using Iodoflex. 7/24; no change from last week in terms of surface area. We have been using Iodoflex. Surface looks and continues to look somewhat better 7/31; surface area slightly smaller better looking surface. We have been using Iodoflex. This is under Unna boot compression 8/7-Patient presents at 1 week with Unna boot and Iodoflex, wound appears better 8/14-Patient presents at 1 week with Iodoflex, we use the Unna boot, wound appears to be stable better.Patient is getting Botox treatment for the inversion of the foot for tendon release, Next week 8/21; we are using Iodoflex. Unna boot. The wound is stable in terms of surface area. Under illumination there is some areas of the wound that appear to be either epithelialized or perhaps this is adherent slough at this point I was not really clear. It did not wipe off and I was reluctant to debride this today. 8/28; we are using Iodoflex in an Unna boot. Seems to be making good improvement. 9/4; using Iodoflex and wound is slightly smaller. 9/18; we are using Iodoflex with topical silver nitrate when she is here. The wound continues to be smaller 10/2; patient missed her appointment last week due to GI issues. She left and Iodoflex based dressing on for 2 weeks.  Wound is about the same size about the size of a dime on the left medial lower 10/9 we have been using Iodoflex on the medial left ankle wound. She has a new superficial probable wrap injury on the dorsal left ankle 10/16; we have been using Hydrofera Blue since last week. This is on the left medial ankle Objective Constitutional Vitals Time Taken: 10:11 AM, Height: 68 in, Weight: 127 lbs, BMI: 19.3, Temperature: 98.6 F, Pulse: 58 bpm, Respiratory Rate: 17 breaths/min, Blood Pressure: 128/39 mmHg. Integumentary (Hair, Skin) Wound #11 status is Healed - Epithelialized. Original cause of wound was Blister. The wound is located on the Left,Anterior Ankle. The wound measures 0cm length x 0cm width x 0cm depth; 0cm^2 area and 0cm^3 volume. There is no tunneling or undermining noted. There is a none present amount of drainage noted. The wound margin is distinct with the outline attached to the wound base. There is no granulation within the wound bed. There is no necrotic tissue within the wound bed. Wound #3 status is Open. Original cause of wound was Gradually Appeared. The wound is located on the Left,Medial Malleolus. The wound measures 1.1cm length x 1.5cm width x 0.1cm depth; 1.296cm^2 area and 0.13cm^3 volume. There is Fat Layer (Subcutaneous Tissue) Exposed exposed. There is no  tunneling or undermining noted. There is a medium amount of serosanguineous drainage noted. The wound margin is flat and intact. There is large (67-100%) pink granulation within the wound bed. There is a small (1-33%) amount of necrotic tissue within the wound bed including Adherent Slough. 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 There was a Excisional Skin/Subcutaneous Tissue Debridement with a total area of 1.65 sq cm performed by [name]., MD. With the following instrument(s): Curette to remove Viable and Non-Viable tissue/material. Material removed includes Subcutaneous Tissue and Slough and after achieving pain control using Lidocaine 4% Topical Solution. No specimens were taken. A time out was conducted at 11:00, prior to the start of the procedure. A Minimum amount of bleeding was controlled with N/A. The procedure was tolerated well with a pain level of 0 throughout and a pain level of 0 following the procedure. Post Debridement Measurements: 1.1cm length x 1.5cm width x 0.1cm depth; 0.13cm^3 volume. Character of Wound/Ulcer Post Debridement is improved. Severity of Tissue Post Debridement is: Fat layer exposed. Post procedure Diagnosis Wound #3: Same as Pre-Procedure Pre-procedure diagnosis of Wound #3 is a Venous Leg Ulcer located on the Left,Medial Malleolus . There was a Haematologist Compression Therapy Procedure by Levan Hurst, RN. Post procedure Diagnosis Wound #3: Same as Pre-Procedure Plan Follow-up Appointments: Return Appointment in 1 week. Dressing Change Frequency: Wound #3 Left,Medial Malleolus: Do not change entire dressing for one week. Skin Barriers/Peri-Wound Care: Moisturizing lotion TCA Cream or Ointment - mixed with lotion Wound Cleansing: Clean wound with Wound Cleanser - clean with anasept with dressing changes May shower with protection. Primary Wound Dressing: Wound #3 Left,Medial Malleolus: Hydrofera Blue Secondary Dressing: Wound #3 Left,Medial Malleolus: Dry Gauze Kerramax Edema Control: Unna Boot to Left Lower Extremity - no kerlix layer Avoid standing for long periods of time Elevate legs to the level of the heart or above for 30 minutes daily and/or when sitting, a  frequency of: - throughout the day Support Garment 20-30 mm/Hg pressure to: - compression stocking right leg daily 1. Continue with Hydrofera Blue. Wound is contracting surface looks reasonable Electronic Signature(s) Signed: 03/18/2019 5:33:39 PM By: Linton Ham MD Entered By: Linton Ham on 03/18/2019 13:53:16 -------------------------------------------------------------------------------- SuperBill Details Patient Name: Date of Service: [name] 03/18/2019 Medical Record [id number] Patient Account Number: [id number] Date of Birth/Sex: Treating RN: 1955/08/04 (63 y.o. 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 L97.321 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 breakdown of skin Facility Procedures CPT4 Code Description: [id number] - DEB SUBQ TISSUE 20 SQ CM/< ICD-10 Diagnosis Description L97.322 Non-pressure chronic ulcer of left ankle with fat layer Modifier: exposed Quantity: 1 Physician Procedures CPT4 Code Description: [id number] - WC PHYS SUBQ TISS 20 SQ CM ICD-10 Diagnosis Description L97.322 Non-pressure chronic ulcer of left ankle with fat layer Modifier: exposed Quantity: 1 Electronic Signature(s) Signed: 03/18/2019 5:33:39 PM  By: Linton Ham MD Entered By: Linton Ham on 03/18/2019 13:53:42

## 2019-03-25 ENCOUNTER — Encounter (HOSPITAL_BASED_OUTPATIENT_CLINIC_OR_DEPARTMENT_OTHER): Payer: Medicare Other | Admitting: Internal Medicine

## 2019-03-25 ENCOUNTER — Other Ambulatory Visit: Payer: Self-pay

## 2019-03-25 DIAGNOSIS — L97322 Non-pressure chronic ulcer of left ankle with fat layer exposed: Secondary | ICD-10-CM | POA: Diagnosis not present

## 2019-03-25 NOTE — Progress Notes (Addendum)
[name], [name] ([id number]) Visit Report for 03/25/2019 HPI Details Patient Name: Date of Service: [name], [name] 03/25/2019 10:00 AM Medical Record [id number] Patient Account Number: [id number] Date of Birth/Sex: Treating RN: 1955/09/15 (63 y.o. [name] Primary Care Provider: [name] Other Clinician: [name] Referring Provider: Treating Provider/Extender:Zsazsa Bahena, Tawny Asal in Treatment: 328 History of Present Illness HPI Description: the remaining wound is over the left medial ankle. Similar wound over the right medial ankle healed largely with use of Apligraf. Most recently we have been using Hydrofera Blue over this wound with considerable improvement. The patient has been extensively worked up in the past for her venous insufficiency and she is not a candidate for antireflux surgery although I have none of the details available currently. 08/24/14; considerable improvement today. About 50% of this wound areas now epithelialized. The base of the wound appears to be healthier granulation.as opposed to last week when she had deteriorated a considerable improvement 08/17/14; unfortunately the wound has regressed somewhat. The areas of epithelialization from the superior aspect are not nearly as healthy as they were last week. The patient thinks her Hydrofera Blue slipped. 09/07/14; unfortunately the area has markedly regressed in the 2 weeks since I've seen this. There is an odor surrounding erythema. The healthy granulation tissue that we had at the base of the wound now is a dusky color. The nurse reports green drainage 09/14/14; the area looks somewhat better than last week. There is less erythema and less drainage. The culture I did did not show any growth. Nevertheless I think it is better to continue the Cipro and doxycycline for a further week. The remaining wound area was debridement. 09/21/14. Wound did not require debridement last  week. Still less erythema and less drainage. She can complete her antibiotics. The areas of epithelialization in the superior aspect of the wound do not look as healthy as they did some weeks ago 10/05/14 continued improvement in the condition of this wound. There is advancing epithelialization. Less aggressive debridement required 10/19/14 continued improvement in the condition and volume of this wound. Less aggressive debridement to the inferior part of this to remove surface slough and fibrinous eschar 11/02/14 no debridement is required. The surface granulation appears healthy although some of her islands of epithelialization seem to have regressed. No evidence of infection 11/16/14; lites surface debridement done of surface eschar. The wound does not look to be unhealthy. No evidence of infection. Unfortunately the patient has had podiatry issues in the right foot and for some reason has redeveloped small surface ulcerations in the medial right ankle. Her original presentation involved wounds in this area 11/23/14 no debridement. The area on the right ankle has enlarged. The left ankle wound appears stable in terms of the surface although there is periwound inflammation. There has been regression in the amount of new skin 11/30/14 no debridement. Both wound areas appear healthy. There was no evidence of infection. The the new area on the right medial ankle has enlarged although that both the surfaces appear to be stable. 12/07/14; Debridement of the right medial ankle wound. No no debridement was done on the left. 12/14/14 no major change in and now bilateral medial ankle wounds. Both of these are very painful but the no overt evidence of infection. She has had previous venous ablation 12/21/14; patient states that her right medial ankle wound is considerably more painful last week than usual. Her left is also somewhat painful. She could not tolerate debridement. The right  medial ankle wound has  fibrinous surface eschar 12/28/14 this is a patient with severe bilateral venous insufficiency ulcers. For a considerable period of time we actually had the one on the right medial ankle healed however this recently opened up again in June. The left medial ankle wound has been a refractory area with some absent flows. We had some success with Hydrofera Blue on this area and it literally closed by 50% however it is recently opened up Foley. Both of these were debridement today of surface eschar. She tolerates this poorly 01/25/15: No change in the status of this. Thick adherent escar. Very poor tolerance of any attempt at debridement. I had healed the right medial malleolus wound for a considerable amount of time and had the left one down to about 50% of the volume although this is totally regressed over the last 48 weeks. Further the right leg has reopened. she is trying to make a appointment with pain and vascular, previous ablations with Dr. Aleda Grana. I do not believe there is an arterial insufficiency issue here 02/01/15 the status of the adherent eschar bilaterally is actually improved. No debridement was done. She did not manage to get vascular studies done 02/08/15 continued debridement of the area was done today. The slough is less adherent and comes off with less pressure. There is no surrounding infection peripheral pulses are intact 02/15/15 selective debridement with a disposable curette. Again the slough is less adherent and comes off with less difficulty. No surrounding infection peripheral pulses are intact. 02/22/15 selective debridement of the right medial ankle wound. Slough comes off with less difficulty. No obvious surrounding infection peripheral pulses are intact I did not debridement the one on the left. Both of these are stable to improved 03/01/15 selective debridement of both wound areas using a curette to. Adherent slough cup soft with less difficulty. No obvious  surrounding infection. The patient tells me that 2 days ago she noted a rash above the right leg wrap. She did not have this on her lower legs when she change this over she arrives with widespread left greater than right almost folliculitis-looking rash which is extremely pruritic. I don't see anything to culture here. There is no rash on the rest of her body. She feels well systemically. 03/08/15; selective debridement of both wounds using a curette. Base of this does not look unhealthy. She had limegreen drainage coming out of the left leg wound and describes a lot of drainage. The rash on her left leg looks improved to. No cultures were done. 03/22/15; patient was not here last week. Basal wounds does not look healthy and there is no surrounding erythema. No drainage. There is still a rash on the left leg that almost looks vasculitic however it is clearly limited to the top of where the wrap would be. 04/05/15; on the right required a surgical debridement of surface eschar and necrotic subcutaneous tissue. I did not debridement the area on the left. These continue to be large open wounds that are not changing that much. We were successful at one point in healing the area on the right, and at the same time the area on the left was roughly half the size of current measurements. I think a lot of the deterioration has to do with the prolonged time the patient is on her feet at work 04/19/15 I attempted-like surface debridement bilaterally she does not tolerate this. She tells me that she was in allergic care yesterday with extreme pain over her  left lateral malleolus/ankle and was told that she has an "sprain" 05/03/15; large bilateral venous insufficiency wounds over the medial malleolus/medial aspect of her ankles. She complains of copious amounts of drainage and his usual large amounts of pain. There is some increasing erythema around the wound on the right extending into the medial aspect of her  foot to. historically she came in with these wounds the right one healed and the left one came down to roughly half its current size however the right one is reopened and the left is expanded. 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 05/10/15 large bilateral venous insufficiency wounds. There is less adherence surface left however the surface culture that I did last week grew pseudomonas therefore bilateral selective debridement score necessary. There is surrounding erythema. The patient describes severe bilateral drainage and a lot of pain in the left ankle. Apparently her podiatrist was were ready to do a cortisone shot 05/17/15; the patient complains of pain and again copious amounts of drainage. 05/24/15; we used Iodo flex last week. Patient notes considerable improvement in wound drainage. Only needed to change this once. 05/31/15; we continued Iodoflex; the base of these large wounds bilaterally is not too bad but there is probably likely a significant bioburden here. I would like to debridement just doesn't tolerate it. 06/06/14 I would like to continue the Iodoflex although she still hasn't managed to obtain supplies. She has bilateral medial malleoli or large wounds which are mostly superficial. Both of them are covered circumferentially with some nonviable fibrinous slough although she tolerates debridement very poorly. She apparently has an appointment for an ablation on the right leg by interventional radiology. 06/14/15; the patient arrives with the wounds and static condition. We attempted a debridement although she does not do well with this secondary to pain. I 07/05/15; wounds are not much smaller however there appears to be a cleaner granulating base. The left has tight fibrinous slough greater than the right. Debridement is tolerated poorly due to pain. Iodoflex is done more for these wounds in any of the multitude of different  dressings I have tried on the left 1 and then subsequently the right. 07/12/15; no change in the condition of this wound. I am able to do an aggressive debridement on the right but not the left. She simply cannot tolerate it. We have been using Iodoflex which helps somewhat. It is worthwhile remembering that at one point we healed the right medial ankle wound and the left was about 25% of the current circumference. We have suggested returning to vascular surgery for review of possible further ablations for one reason or another she has not been able to do this. 07/26/15 no major change in the condition of either wound on her medial ankle. I did not attempt to debridement of these. She has been aggressively scrubbing these while she is in the shower at home. She has her supply of Iodoflex which seems to have done more for these wounds then anything I have put on recently. 08/09/15 wound area appears larger although not verified by measurements. Using Iodoflex 09/05/2015 -- she was here for avisit today but had significant problems with the wound and I was asked to see her for a physician opinion. I have summarize that this lady has had surgery on her left lower extremity about 10 years ago where the possible veins stripping was done. She has had an opinion from  interventional radiology around November 2016 where no further sclerotherapy was ordered. The patient works 12 hours a day and stands on a concrete floor with work boots and is unable to get the proper compression she requires and cannot elevate her limbs appropriately at any given time. She has recently grown Pseudomonas from her wound culture but has not started her ciprofloxacin which was called in for her. 09/13/15 this continues to be a difficult situation for this patient. At one point I had this wound down to a 1.5 x 1.5" wound on her left leg. This is deteriorated and the right leg has reopened. She now has substantial wounds on  her medial calcaneus, malleoli and into her lower leg. One on the left has surface eschar but these are far too painful for me to debridement here. She has a vascular surgery appointment next week to see if anything can be done to help here. I think she has had previous ablations several years ago at [facility]. She has no major edema. She tells me that she did not get product last time [facility]] and went for several days without it. She continues to work in work boots 12 hours a day. She cannot get compression/4-layer under her work boots. 09/20/15 no major change. Periwound edema control was not very good. Her point with pain and vascular is next Wednesday the 25th 09/28/15; the patient is seen vascular surgery and is apparently scheduled for repeat duplex ultrasounds of her bilateral lower legs next week. 10/05/15; the patient was seen by Dr. Doren Custard of vascular surgery. He feels that she should have arterial insufficiency excluded as cause/contributed to her nonhealing stage she is therefore booked for an arteriogram. She has apparently monophasic signals in the dorsalis pedis pulses. She also of course has known severe chronic venous insufficiency with previous procedures as noted previously. I had another long discussion with the patient today about her continuing to work 12 hour shifts. I've written her out for 2 months area had concerns about this as her work location is currently undergoing significant turmoil and this may lead to her termination. She is aware of this however I agree with her that she simply cannot continue to stand for 12 hours multiple days a week with the substantial wound areas she has. 10/19/15; the Dr. Doren Custard appointment was largely for an arteriogram which was normal. She does not have an arterial issue. He didn't make a comment about her chronic venous insufficiency for which she has had previous ablations. Presumably it was not felt that anything additional could  be done. The patient is now out of work as I prescribed 2 weeks ago. Her wounds look somewhat less aggravated presumably because of this. I felt I would give debridement another try today 10/25/15; no major change in this patient's wounds. We are struggling to get her product that she can afford into her own home through her insurance. 11/01/15; no major change in the patient's wounds. I have been using silver alginate as the most affordable product. I spoke to Dr. Marla Roe last week with her requested take her to the OR for surgical debridement and placement of ACEL. Dr. Marla Roe told me that she would be willing to do this however [facility] will not cover this, fortunately the patient has [place] healthcare of some variant 11/08/15; no major change in the patient's wounds. She has been completely nonviable surface that this but is in too much pain with any attempted debridement are clinic.  I have arranged for her to see Dr. Marla Roe ham of plastic surgery and this appointment is on Monday. I am hopeful that they will take her to the OR for debridement, possible ACEL ultimately possible skin graft 11/22/15 no major change in the patient's wounds over her bilateral medial calcaneus medial malleolus into the lower legs. Surface on these does not look too bad however on the left there is surrounding erythema and tenderness. This may be cellulitis or could him sleepy tinea. 11/29/15; no major changes in the patient's wounds over her bilateral medial malleolus. There is no infection here and I don't think any additional antibiotics are necessary. There is now plan to move forward. She sees Dr. Marla Roe in a week's time for preparation for operative debridement and ACEL placement I believe on 7/12. She then has a follow-up appointment with Dr. Marla Roe on 7/21 12/28/15; the patient returns today having been taken to the [facility] by Dr. Marla Roe 12/12/15 she underwent debridement,  intraoperative cultures [which were negative]. She had placement of a wound VAC. Parent really ACEL was not available to be placed. The wound VAC foam apparently adhered to the wound since then she's been using silver alginate, Xeroform under Ace wraps. She still says there is a lot of drainage and a lot of pain 01/31/16; this is a patient I see monthly. I had referred her to Dr. Marla Roe him of plastic surgery for large wounds on her bilateral medial ankles. She has been to the OR twice once in early July and once in early August. She tells me over the last 3 weeks she has been using the wound VAC with ACEL underneath it. On the right we've simply been using silver alginate. Under Kerlix Coban wraps. 02/28/16; this is a patient I'm currently seeing monthly. She is gone on to have a skin graft over her large venous insufficiency ulcer on the left medial ankle. This was done by Dr. Marla Roe him. The patient is a bit perturbed about why she didn't have one on her right medial ankle wound. She has been using silver alginate to this. 03/06/16; I received a phone call from her plastic surgery Dr. Marla Roe. She expressed some concern about the viability of the skin graft she did on the left medial ankle wound. Asked me to place Endoform on this. She told me she is not planning to do a subsequent skin graft on the right as the left one did not take very well. I had placed Hydrofera Blue on the right 03/13/16; continue to have a reasonably healthy wound on the right medial ankle. Down to 3 mm in terms of size. There is epithelialization here. The area on the left medial ankle is her skin graft site. I suppose the last week this looks somewhat better. She has an open area inferiorly however in the center there appears to be some viable tissue. There is a lot of surface callus and eschar that will eventually need to come off however none of this looked to be infected. Patient states that the is able to  keep the dressing on for several days which is an improvement. 03/20/16 no major change in the circumference of either wound however on the left side the patient was at Dr. Eusebio Friendly office and they did a debridement of left wound. 50% of the wound seems to be epithelialized. I been using Endoform on the left Hydrofera Blue in the right 03/27/16; she arrives today with her wound is not looking as healthy as  they did last week. The area on the right clearly has an adherent surface to this a very similar surface on the left. Unfortunately for this patient this is all too familiar problem. Clearly the Endoform is not working and will need to change that today that has some potential to help this surface. She does not tolerate debridement in this clinic very well. She is changing the dressing wants 04/03/16; patient arrives with the wounds looking somewhat better especially on the right. Dr. Migdalia Dk change the dressing to silver alginate when she saw her on Monday and also sold her some compression socks. The usefulness of the latter is really not clear and woman with severely draining wounds. 04/10/16; the patient is doing a bit of an experiment wearing the compression stockings that Dr. Migdalia Dk provided her to her left leg and the out of legs based dressings that we provided to the right. 05/01/16; the patient is continuing to wear compression stockings Dr. Migdalia Dk provided her on the left that are apparently silver impregnated. She has been using Iodoflex to the right leg wound. Still a moderate amount of drainage, when she leaves here the wraps only last for 4 days. She has to change the stocking on the left leg every night 05/15/16; she is now using compression stockings bilaterally provided by Dr. Marla Roe. She is wearing a nonadherent layer over the wounds so really I don't think there is anything specific being done to this now. She has some reduction on the left wound. The right is stable. I  think all healing here is being done without a specific dressing 06/09/16; patient arrives here today with not much change in the wound certainly in diameter to large circular wounds over the medial aspect of her ankle bilaterally. Under the light of these services are certainly not viable for healing. There is no evidence of surrounding infection. She is wearing compression stockings with some sort of silver impregnation as prescribed by Dr. Marla Roe. She has a follow-up with her tomorrow. 06/30/16; no major change in the size or condition of her wounds. These are still probably covered with a nonviable surface. She is using only her purchase stockings. She did see Dr. Marla Roe who seemed to want to apply Dakin's solution to this I'm not extreme short what value this would be. I would suggest Iodoflex which she still has at home. 07/28/16; I follow Mrs. Purdy episodically along with Dr. Marla Roe. 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 She has been applying a topical collagen ointment to both wounds with Adaptic. I don't think Dr. Marla Roe is planning to take her back to the OR. 08/19/16; I follow Mrs. Jeneen Rinks on a monthly basis along with Dr. Marla Roe of plastic surgery. 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 I been following her for a number of years. At one point I was able to get the right medial malleolus wound to heal and had the left medial malleolus down to about half its current size however and I had to send her to plastic surgery for an operative debridement. Since then things have been stable to slightly improve the area on the right is slightly better one in the left about the same although there is much less adherent surface than I'm used to with this patient. She is using some form of liquid collagen gel that Dr. Marla Roe provided a Kerlix  cover with the  patient's own pressure stockings. She tells me that she has extreme pain in both ankles and along the lateral aspect of both feet. She has been unable to work for some period of time. She is telling me she is retiring at the beginning of April. She sees Dr. Doran Durand of orthopedics next week 09/22/16; patient has not seen Dr. Marla Roe since the last time she is here. I'm not really sure what she is using to the wounds other than bits and pieces of think she had left over including most recently Hydrofera Blue. She is using juxtalite stockings. She is having difficulty with her husband's recent illness "stroke". She is having to transport him to various doctors appointments. Dr. Marla Roe left her the option of a repeat debridement with ACEL however she has not been able to get the time to follow-up on this. She continues to have a fair amount of drainage out of these wounds with certainly precludes leaving dressings on all week 10/13/16; patient has not seen Dr. Marla Roe since she was last in our clinic. I'm not really sure what she is doing with the wounds, we did try to get her [facility] and I think she is actually using this most of the time. Because of drainage she states she has to change this every second day although this is an improvement from what she used to do. She went to see Dr. Doran Durand who did not think she had a muscular issue with regards to her feet, he referred her to a neurologist and I think the appointment is sometime in June. I changed her back to Iodoflex which she has used in the past but not recently. 11/03/16; the patient has been using Iodoflex although she ran out of this. Still claims that there is a lot of drainage although the wound does not look like this. No surrounding erythema. She has not been back to see Dr. Marla Roe 11/24/16; the patient has been using Iodoflex again but she ran out of it 2 or 3 days ago. There is no major change in the  condition of either one of these wounds in fact they are larger and covered in a thick adherent surface slough/nonviable tissue especially on the left. She does not tolerate mechanical debridement in our clinic. Going back to see Dr. Marla Roe of plastic surgery for an operative debridement would seem reasonable. 12/15/16; the patient has not been back to see Dr. Marla Roe. She is been dealing with a series of illnesses and her husband which of monopolized her time. She is been using Sorbact which we largely supplied. She states the drainage is bad enough that it maximum she can go 2-3 days without changing the dressing 01/12/2017 -- the patient has not been back for about 4 weeks and has not seen Dr. Marla Roe not does she have any appointment pending. 01/23/17; patient has not seen Dr. Marla Roe even though I suggested this previously. She is using Santyl that was suggested last week by Dr. Con Memos this Cost her $16 through her insurance which is indeed surprising 02/12/17; continuing Santyl and the patient is changing this daily. A lot of drainage. She has not been back to see plastic surgery she is using an Ace wrap. Our intake nurse suggested wrap around stockings which would make a good reasonable alternative 02/26/17; patient is been using Santyl and changing this daily due to drainage. She has not been to see plastic surgery she uses in April Ace wrap to control the edema. She did obtain  extremitease stockings but stated that the edema in her leg was to big for these 03/20/17; patient is using Santyl and Anasept. Surfaces looked better today the area on the right is actually measuring a little smaller. She has states she has a lot of pain in her feet and ankles and is asking for a consult to pain control which I'll try to help her with through our case manager. 04/10/17; the patient arrives with better-looking wound surfaces and is slightly smaller wound on the left she is using a  combination of Santyl and Anasept. She has an appointment or at least as started in the pain control center associated with [facility] 05/14/17; this is a patient who I followed for a prolonged period of time. 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 At one point I had this down to a much smaller wound on the left however these reopened and we've never been able to get these to heal. She has been using Santyl and Anasept gel although 2 weeks ago she ran out of the Anasept gel. She has a stable appearance of the wound. She is going to the wound care clinic at [facility]. They wanted do a nerve block/spinal block although she tells me she is reluctant to go forward with that. 05/21/17; this is a patient I have followed for many years. 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 Chronic pain and deformity in her ankles as well. She is been to see plastic surgery as well as orthopedics. Using PolyMem AG most recently/Kerramax/ABDs and 2 layer compression. She has managed to keep this on and she is coming in for a nurse check to change the dressing on Tuesdays, we see her on Fridays 06/05/17; really quite a good looking surface and the area especially on the right medial has contracted in terms of dimensions. Well granulated healthy-looking tissue on both sides. Even with an open curet there is nothing that even feels abnormal here. This is as good as I've seen this in quite some time. We have been using PolyMem AG and bringing her in for a nurse check 06/12/17; really quite good surface on both of these wounds. The right medial has contracted a bit left is not. We've been using PolyMem and AG and she is coming in for a nurse visit 06/19/17; we have been using PolyMem AG and bringing her in for a nurse check. Dimensions of her wounds are not better but the surfaces looked better bilaterally. She complained of bleeding last night and the left  wound and increasing pain bilaterally. She states her wound pain is more neuropathic than just the wounds. There was some suggestion that this was radicular from her pain management doctor in talking to her it is really difficult to sort this out. 06/26/17; using PolyMem and AG and bringing her in for a nurse check as All of this and reasonably stable condition. Certainly not improved. The dimensions on the lateral part of the right leg look better but not really measuring better. The medial aspect on the left is about the same. 07/03/16; we have been using PolyMen AG and bringing her in for a nurse check to change the dressings as the wounds have drainage which precludes once weekly changing. We are using all secondary absorptive dressings.our intake nurse is brought up the idea of using a wound VAC/snap VAC on the wound to help with the drainage to see if this would result in some contraction.  This is not a bad idea. The area on the right medial is actually looking smaller. Both wounds have a reasonable-looking surface. There is no evidence of cellulitis. The edema is well controlled 07/10/17; the patient was denied for a snap VAC by her insurance. The major issue with these wounds continues to be drainage. We are using wicked PolyMem AG and she is coming in for a nurse visit to change this. The wounds are stable to slightly improved. The surface looks vibrant and the area on the right certainly has shrunk in size but very slowly 07/17/17; the patient still has large wounds on her bilateral medial malleoli. Surface of both of these wounds looks better. The dimensions seem to come and go but no consistent improvement. There is no epithelialization. We do not have options for advanced treatment products due to insurance issues. They did not approve of the wound VAC to help control the drainage. More recently we've been using PolyMem and AG wicked to allow drainage through. We have been bringing her in  for a nurse visit to change this. We do not have a lot of options for wound care products and the home again due to insurance issues 07/24/17; the patient's wound actually looks somewhat better today. No drainage measurements are smaller still healthy-looking surface. We used silver collagen under PolyMen started last week. We have been bringing her in for a dressing change 07/31/17; patient's wound surface continued to look better and I think there is visible change in the dimensions of the wound on the right. Rims of epithelialization. We have been using silver collagen under PolyMen and bringing her in for a dressing change. There appears to be less drainage although she is still in need of the dressing change 08/07/17. Patient's wound surface continues to look better on both sides and the area on the right is definitely smaller. We have been using silver collagen and PolyMen. She feels that the drainage has been it has been better. I asked her about her vascular status. She went to see Dr. Aleda Grana at [facility] and had some form of ablation. I don't have much detail on this. I haven't my notes from 2016 that she was not a candidate for any further ablation but I don't have any more information on this. We had referred her to vein and vascular I don't think she ever went. He does not have a history of PAD although I don't have any information on this either. We don't even have ABIs in our record 08/14/17; we've been using silver collagen and PolyMen cover. And putting the patient and compression. She we are bringing her in as a nurse visit to change this because ofarge amount of drainage. We didn't the ABIs in clinic today since they had been done in many moons 1.2 bilaterally. She has been to see vein and vascular however this was at [facility] and she had ablation although I really don't have any information on this all seemed biking get a report. She is also been operatively debrided by  plastic surgery and had a cell placed probably 8-12 months ago. This didn't have a major effect. We've been making some gains with current dressings 08/19/17-She is here in follow-up evaluation for bilateral medial malleoli ulcers. She continues to tolerate debridement very poorly. We will continue with recently changed topical treatment; if no significant improvement may consider switching to Iodosorb/Iodoflex. 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 She has been  using silver collagen and PolyMem. I believe she has been used and tried on Iodoflex before. 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 These have been very refractory over time. We have been using silver collagen and PolyMen. She is coming in once a week for a doctor's and once a week for nurse visits. We are actually making some progress 09/18/17; the patient's wounds are smaller especially on the right medial. She arrives today to upset to consider even washing these off with Anasept which I think is been part of the reason this is been closing. We've been using collagen covered in PolyMen otherwise. It is noted that she has a small area of folliculitis on the right medial calf that. As we are wrapping her legs I'll give her a short course of doxycycline to make sure this doesn't amount to anything. She is a long list of complaints today including imbalance, shortness of breath on exertion, inversion of her left ankle. With regards to the latter complaints she is been to see orthopedics and they offered her a tendon release surgery I believe but wanted her wounds to be closed first. I have recommended she go see her primary physician with regards to everything else. 09/25/17; patient's wounds  are about the same size. We have made some progress bilaterally although not in recent weeks. She will not allow me T wash these wounds with Anasept even if she is doing her cell. Wheeze we've been using collagen covered in PolyMen. Last week she had a small area of folliculitis this is now opened into a small wound. She completed 5 days of trimethoprim sulfamethoxazole 10/02/17; unfortunately the area on her left medial ankle is worse with a larger wound area towards the Achilles. The patient complains of a lot of pain. She will not allow debridement although visually I don't think there is anything to debridement in any case. We have been using silver collagen and PolyMen for several months now. Initially we are making some progress although I'm not really seeing that today. We will move back to [facility]. His admittedly this is a bit of a repeat however I'm hoping that his situation is different now. The patient tells me she had her leg on the left give out on her yesterday this is process some pain. 10/09/17; the patient is seen twice a week largely because of drainage issues coming out of the chronic medial bimalleolar wounds that are chronic. Last week the dimensions of the one on the left looks a little larger I changed her to [facility]. She comes in today with a history of terrible pain in the bilateral wound areas. She will not allow debridement. She will not even allow a tissue culture. There is no surrounding erythema no no evidence of cellulitis. We have been putting her Kerlix Coban man. She will not allow more aggressive compression as there was a suggestion to put her in 3 layer wraps. 10/16/17; large wounds on her bilateral medial malleoli. These are chronic. Not much change from last week. The surface looks have healthy but absolutely no epithelialization. A lot of pain little less so of drainage. She will not allow debridement or even washing these off in the vigorous  fashion with Anasept. 10/23/17; large wounds on her bilateral malleoli which are chronic. Some improvement  in terms of size perhaps on the right since last time I saw these. She states that after we increased the 3 layer compression there was some bleeding, when she came in for a nurse visit she did not want 3 layer compression put back on about our nurse managed to convince her. She has known chronic venous visit issues and I'm hoping to get her to tolerate the 3 layer compression. using Hydrofera Blue 10/30/17; absolutely no change in the condition of either wound although we've had some improvement in dimensions on the right.. Attempted to put her in 3 layer compression she didn't tolerated she is back in 2 layer compression. We've been using Hydrofera Blue We looked over her past records. She had venous reflux studies in November 2016. There was no evidence of deep venous reflux on the right. Superficial vein did not show the greater saphenous vein at think this is been previously ablated the small saphenous vein was within normal limits. The left deep venous system showed no DVT the vessels were positive for deep venous reflux in the posterior tibial veins at the ankle. The greater saphenous vein was surgically absent small saphenous vein was within normal limits. She went to vein and vascular at [facility]. I believe she had an ablation on the left greater saphenous vein. I'll update her reflux studies perhaps ever reviewed by vein and vascular. We've made absolutely no progress in these wounds. Will also try to read and TheraSkins through her insurance 11/06/17; W the patient apparently has a 2 week follow-up with vein and vascular I like him to review the whole issue with regards to her previous vascular workup by Dr. Aleda Grana. We've really made no progress on these wounds in many months. She arrives today with less viable looking surface on the left medial ankle wound. This  was apparently looking about the same on Tuesday when she was here for nurse visit. 11/13/17; deep tissue culture I did last time of the left lower leg showed multiple organisms without any predominating. In particular no Staphylococcus or group A strep were isolated. We sent her for venous reflux studies. She's had a previous left greater saphenous vein stripping and I think sclerotherapy of the right greater saphenous vein. She didn't really look at the lesser saphenous vein this both wounds are on the medial aspect. She has reflux in the common femoral vein and popliteal vein and an accessory vein on the right and the common femoral vein and popliteal vein on the left. I'm going to have her go to see vein and vascular just the look over things and see if anything else beside aggressive compression is indicated here. We have not been able to make any progress on these wounds in spite of the fact that the surface of the wounds is never look too bad. 11/20/17; no major change in the condition of the wounds. Patient reports a large amount of drainage. She has a lot of complaints of pain although enlisting her today I wonder if some of this at least his neuropathic rather than secondary to her wounds. She has an appointment with vein and vascular on 12/30/17. The refractory nature of these wounds in my mind at least need vein and vascular to look over the wounds the recent reflux studies we did and her history to see if anything further can be done here. I also note her gait is deteriorated quite a bit. 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  She has a bilateral Trendelenburg gait. I wonder if this is neuropathic or perhaps multilevel radicular. 11/27/17; her wounds actually looks slightly better. Healthy-looking granulation tissue a scant amount of epithelialization. [place] healthcare will not pay for [employer]. They will play for tri layer Oasis and Dermagraft. This is not a diabetic  ulcer. We'll try for the tri layer Oasis. She still complains of some drainage. She has a vein and vascular appointment on 12/30/17 12/04/17; the wounds visually look quite good. Healthy-looking granulation with some degree of epithelialization. We are still waiting for response to our request for trial to try layer Oasis. Her appointment with vascular to review venous and arterial issues isn't sold the end of July 7/31. Not allow debridement or even vigorous cleansing of the wound surface. 12/18/17; slightly smaller especially on the right. 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 We've been using Hydrofera Blue. We still are looking into triple layer Oasis through her insurance 01/08/18 on evaluation today patient's wound actually appears to be showing signs of good improvement at this point in time. She has been tolerating the dressing changes without complication. Fortunately there does not appear to be any evidence of infection at this point in time. We have been utilizing silver nitrate which does seem to be of benefit for her which is also good news. Overall I'm very happy with how things seem to be both regards appearance as well as measurement. Patient did see Dr. Bridgett Larsson for evaluation on 12/30/17. In his assessment he felt that stripping would not likely add much more than chronic compression to the patient's healing process. His recommendation was to follow-up in three months with Dr. Doren Custard if she hasn't healed in order to consider referral back to you and see vascular where she previously was in a trial and was able to get her wound to heal. I'll be see what she feels she when you staying compression and he reiterated this as well. 01/13/18 on evaluation today patient appears to actually be doing very well in regard to her bilateral medial malleolus ulcers. She seems to have tolerated the chemical cauterization with silver nitrate last week she did have some  pain through that evening but fortunately states that I'll be see since it seems to be doing better she is overall pleased with the progress. 01/21/18; really quite a remarkable improvement since I've last seen these wounds. We started using silver nitrate specially on the islands of hyper granulation which for some reason her around the wound circumference. This is really done quite nicely. Primary dressing Hydrofera Blue under 4 layer compression. She seems to be able to hold out without a nurse rewrap. Follow-up in 1 week 01/28/18; we've continued the hydrofera blue but continued with chemical cauterization to the wound area that we started about a month ago for irregular hyper granulation. She is made almost stunning improvement in the overall wound dimensions. I was not really expecting this degree of improvement in these chronic wounds 02/05/18; we continue with Hydrofera Bluebut of also continued the aggressive chemical cauterization with silver nitrate. We made nice progress with the right greater than left wound. 02/12/18. We continued with Hydrofera Blue after aggressive chemical cauterization with silver nitrate. We appear to be making nice progress with both wound areas 02/19/2018; we continue with [facility] after washing the wounds vigorously with Anasept spray and chemical cauterization with silver nitrate. We are making excellent progress. The area on the right's just about closed 02/26/2018. The area on the  left medial ankle had too much necrotic debris today. I used a #5 curette we are able to get most of the soft. I continued with the silver nitrate to the much smaller wound on the right medial ankle she had a new area on her right lower pretibial area which she says was due to a role in her compression 03/05/2018; both wound areas look healthy. Not much change in dimensions from last week. I continue to use silver nitrate and Hydrofera Blue. The patient saw Dr. Doren Custard of vein and  vascular. He felt she had venous stasis ulcers. He felt based on her previous arteriogram she should have adequate circulation for healing. Also she has deep venous reflux but really no significant correctable superficial venous reflux at this time. He felt we should continue with conservative management including leg elevation and compression 04/02/2018; since we last saw this woman about a month ago she had a fall apparently suffered a pelvic fracture. I did not look up the x-ray. Nevertheless because of pain she literally was bedbound for 2 weeks and had home health coming out to change the dressing. Somewhat predictably this is resulted in considerable improvement in both wound areas. The right is just about closed on the medial malleolus and the left is about half the size. 04/16/2018; both her wounds continue to go down in size. Using Hydrofera Blue. 05/07/18; both her wounds appeared to be improving especially on the right where it is almost closed. We are using Hydrofera Blue 05/14/2018; slightly worse this week with larger wounds. Surface on the left medial not quite as good. We have been using Hydrofera Blue 05/21/18; again the wounds are slightly larger. Left medial malleolus slightly larger with eschar around the circumference. We have been using Hydrofera Blue undergoing a wraps for a prolonged period of time. This got a lot better when she was more recumbent due to a fall and a back injury. I change the primary dressing the silver alginate today. She did not tolerate a 4 layer compression previously although I may need to bring this up with her next time 05/28/2018; area on the left medial malleolus again is slightly larger with more drainage. Area on the right is roughly unchanged. She has a small area of folliculitis on the right medial just on the lower calf. This does not look ominous. 06/03/2018 left medial malleolus slightly smaller in a better looking surface. We used silver  nitrate on this last time with silver alginate. The area on the right appears slightly smaller 1/10; left medial malleolus slightly smaller. Small open area on the right. We used silver nitrate and silver alginate as of 2 weeks ago. We continue with the wound and compression. These got a lot better when she was off her feet 1/17; right medial malleolus wound is smaller. The left may be slightly smaller. Both surfaces look somewhat better. 1/24; both wounds are slightly smaller. Using silver alginate under Unna boots 1/31; both wounds appear smaller in fact the area on the right medial is just about closed. Surface eschar. We have been using silver alginate under Unna boots. The patient is less active now spends let much less time on her feet and I think this is contributed to the general improvement in the wound condition 2/7; both wounds appear smaller. I was hopeful the right medial would be closed however there there is still the same small open area. Slight amount of surface eschar on the left the dimensions are smaller there is  eschar but the wound edges appear to be free. We have been using silver alginate under Unna boot's 2/14; both wounds once again measure smaller. Circumferential eschar on the left medial. We have been using silver alginate under Unna boots with gradual improvement 2/21; the area on the right medial malleolus has healed. The area on the left is smaller. We have been using silver alginate and Unna boots. We can discharge wrapping the right leg she has 20/30 stockings at home she will need to protect the scar tissue in this area 2/28; the area on the right medial malleolus remains closed the patient has a compression stocking. The area on the left is smaller. We have been using silver alginate and Unna boots. 3/6 the area on the right medial ankle remains closed. Good edema control noted she is using her own compression stocking. The area on the left medial ankle is  smaller. We have been managing this with silver alginate and Unna boots which we will continue today. 3/13; the area on the right medial ankle remains closed and I'm declaring it healed today. When necessary the left is about the same still a healthy-looking surface but no major change and wound area. No evidence of infection and using silver alginate under unna and generally making considerable improvement 3/27 the area on the right medial ankle remains closed the area on the left is about the same as last week. Certainly not any worse we have been using silver alginate under an Unna boot 4/3; the area on the right medial ankle remains closed per the patient. We did not look at this wound. The wound on the left medial ankle is about the same surface looks healthy we have been using silver alginate under an Unna boot 4/10; area on the right medial ankle remains closed per the patient. We did not look at this wound. The wound on the left medial ankle is slightly larger. The patient complains that the [facility] caused burning pain all week. She also told us that she was a lot more active this week. Changed her back to silver alginate 4/17; right medial ankle still closed per the patient. Left medial ankle is slightly larger. Using silver alginate. She did not tolerate Hydrofera Blue on this area 4/24; right medial ankle remains closed we have not look at this. The left medial ankle continues to get larger today by about a centimeter. We have been using silver alginate under Unna boots. She complains about 4 layer compression as an alternative. She has been up on her feet working on her garden 5/8; right medial ankle remains closed we did not look at this. The left medial ankle has increased in size about 100%. We have been using silver alginate under Unna boots. She noted increased pain this week and was not surprised that the wound is deteriorated 5/15; no major change in SA however much less  erythema ( one week of doxy ocellulitis). 5/22-63 year old female returns at 1 week to the clinic for left medial ankle wound for which we have been using silver alginate under 3 layer compression She was placed on DOXY at last visit - the wound is wider at this visit. She is in 3 layer compression 5/29; change to [facility] last week. I had given her empiric doxycycline 2 weeks ago for a week. She is in 3 layer compression. She complains of a lot of pain and drainage on presentation today. 6/5; using Hydrofera Blue. I gave her doxycycline recently  empirically for erythema and pain around the wound. Believe her cultures showed enterococcus which not would not have been well covered by doxycycline nevertheless the wound looks better and I don't feel specifically that the enterococcus needs to be covered. She has a new what looks like a wrap injury on her lateral left ankle. 6/12; she is using Hydrofera Blue. She has a new area on the left anterior lower tibial area. This was a wrap injury last week. 6/19; the patient is using Hydrofera Blue. She arrived with marked inflammation and erythema around the wound and tenderness. 12/01/18 on evaluation today patient appears to be doing a little bit better based on what I'm hearing from the standpoint of lassos evaluation to this as far as the overall appearance of the wound is concerned. Then sometime substandard she typically sees Dr. Dellia Nims. Nonetheless overall very pleased with the progress that she's made up to this point. No fevers, chills, nausea, or vomiting noted at this time. 7/10; some improvement in the surface area. Aggressively debrided last week apparently. I went ahead with the debridement today although the patient does not tolerate this very well. We have been using Iodoflex. Still a fair amount of drainage 7/17; slightly smaller. Using Iodoflex. 7/24; no change from last week in terms of surface area. We have been using Iodoflex.  Surface looks and continues to look somewhat better 7/31; surface area slightly smaller better looking surface. We have been using Iodoflex. This is under Unna boot compression 8/7-Patient presents at 1 week with Unna boot and Iodoflex, wound appears better 8/14-Patient presents at 1 week with Iodoflex, we use the Unna boot, wound appears to be stable better.Patient is getting Botox treatment for the inversion of the foot for tendon release, Next week 8/21; we are using Iodoflex. Unna boot. The wound is stable in terms of surface area. Under illumination there is some areas of the wound that appear to be either epithelialized or perhaps this is adherent slough at this point I was not really clear. It did not wipe off and I was reluctant to debride this today. 8/28; we are using Iodoflex in an Unna boot. Seems to be making good improvement. 9/4; using Iodoflex and wound is slightly smaller. 9/18; we are using Iodoflex with topical silver nitrate when she is here. The wound continues to be smaller 10/2; patient missed her appointment last week due to GI issues. She left and Iodoflex based dressing on for 2 weeks. Wound is about the same size about the size of a dime on the left medial lower 10/9 we have been using Iodoflex on the medial left ankle wound. She has a new superficial probable wrap injury on the dorsal left ankle 10/16; we have been using Hydrofera Blue since last week. This is on the left medial ankle 10/23; we have been using Hydrofera Blue since 2 weeks ago. This is on the left medial ankle. Dimensions are better Electronic Signature(s) Signed: 03/25/2019 6:06:00 PM By: Linton Ham MD Entered By: Linton Ham on 03/25/2019 11:01:30 -------------------------------------------------------------------------------- Physical Exam Details Patient Name: Date of Service: MIRIELLE, EATHERLY 03/25/2019 10:00 AM Medical Record [id number] Patient Account Number:  [id number] Date of Birth/Sex: Treating RN: 1956/02/03 (63 y.o. [name] Primary Care Provider: [name] Other Clinician: [name] Referring Provider: Treating Provider/Extender:Sherise Geerdes, Velva Harman, Essie Christine in Treatment: 328 Notes 10/23; wound exam left medial malleolus; I think this is generally improved small wound with reasonably healthy surface. Electronic Signature(s) Signed: 04/24/2019 8:22:52 AM By:  Linton Ham MD Entered By: Linton Ham on 04/19/2019 07:34:26 -------------------------------------------------------------------------------- Physician Orders Details Patient Name: Date of Service: YORLENI, HIBDON 03/25/2019 10:00 AM Medical Record [id number] Patient Account Number: [id number] Date of Birth/Sex: Treating RN: Oct 26, 1955 (63 y.o. 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 Nurse Visit: - 1 week for rewrap Dressing Change Frequency Wound #3 Left,Medial Malleolus Do not change entire dressing for one week. Skin Barriers/Peri-Wound Care Moisturizing lotion TCA Cream or Ointment - mixed with lotion Wound Cleansing Clean wound with Wound Cleanser - clean with anasept with dressing changes May shower with protection. Primary Wound Dressing Wound #3 Left,Medial Malleolus Hydrofera Blue Secondary Dressing Wound #3 Left,Medial Malleolus Dry Gauze Kerramax Edema Control Unna Boot to Left Lower Extremity - no  kerlix layer Avoid standing for long periods of time Elevate legs to the level of the heart or above for 30 minutes daily and/or when sitting, a frequency of: - throughout the day Support Garment 20-30 mm/Hg pressure to: - compression stocking right leg daily Electronic Signature(s) Signed: 03/25/2019 5:59:58 PM By: Levan Hurst RN, BSN Signed: 03/25/2019 6:06:00 PM By: Linton Ham MD Entered By: Levan Hurst on 03/25/2019 10:35:16 -------------------------------------------------------------------------------- Problem List Details Patient Name: Date of Service: [name] 03/25/2019 10:00 AM Medical Record [id number] Patient Account Number: [id number] Date of Birth/Sex: Treating RN: 1955-06-07 (63 y.o. 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 11/16/2014 No Yes of calf and inflammation L97.321 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 03/11/2019 No Yes breakdown of skin Inactive Problems ICD-10 Code Description Active Date Inactive Date I83.223 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 08/03/2014 08/03/2014 and inflammation L03.116 Cellulitis of left lower limb 09/07/2014 09/07/2014 Resolved Problems ICD-10 Code Description Active Date Resolved Date L97.312 Non-pressure chronic ulcer of right ankle with fat layer 04/10/2016 04/10/2016 exposed Electronic Signature(s) Signed: 03/25/2019 6:06:00 PM By: Linton Ham MD Entered By: Linton Ham on 03/25/2019  11:00:51 -------------------------------------------------------------------------------- Progress Note Details Patient Name: Date of Service: [name] 03/25/2019 10:00 AM Medical Record [id number] Patient Account Number: [id number] Date of Birth/Sex: Treating RN: 11/05/1955 (63 y.o. [name] Primary Care Provider: [name] Other Clinician: [name] Referring Provider: Treating Provider/Extender:Linc Renne, Tawny Asal in Treatment: 328 Subjective History of Present Illness (HPI) the remaining wound is over the left medial ankle. Similar wound over the right medial ankle healed largely with use of Apligraf. Most recently we have been using Hydrofera Blue over this wound with considerable improvement. The patient has been extensively worked up in the past for her venous insufficiency and she is not a candidate for antireflux surgery although I have none of the details available currently. 08/24/14; considerable improvement today. About 50% of this wound areas now epithelialized. The base of the wound appears to  be healthier granulation.as opposed to last week when she had deteriorated a considerable improvement 08/17/14; unfortunately the wound has regressed somewhat. The areas of epithelialization from the superior aspect are not nearly as healthy as they were last week. The patient thinks her Hydrofera Blue slipped. 09/07/14; unfortunately the area has markedly regressed in the 2 weeks since I've seen this. There is an odor surrounding erythema. The healthy granulation tissue that we had at the base of the wound now is a dusky color. The nurse reports green drainage 09/14/14; the area looks somewhat better than last week. There is less erythema and less drainage. The culture I did did not show any growth. Nevertheless I think it is better to continue the Cipro and doxycycline for a further week. The remaining wound area was  debridement. 09/21/14. Wound did not require debridement last week. Still less erythema and less drainage. She can complete her antibiotics. The areas of epithelialization in the superior aspect of the wound do not look as healthy as they did some weeks ago 10/05/14 continued improvement in the condition of this wound. There is advancing epithelialization. Less aggressive debridement required 10/19/14 continued improvement in the condition and volume of this wound. Less aggressive debridement to the inferior part of this to remove surface slough and fibrinous eschar 11/02/14 no debridement is required. The surface granulation appears healthy although some of her islands of epithelialization seem to have regressed. No evidence of infection 11/16/14; lites surface debridement done of surface eschar. The wound does not look to be unhealthy. No evidence of infection. Unfortunately the patient has had podiatry issues in the right foot and for some reason has redeveloped small surface ulcerations in the medial right ankle. Her original presentation involved wounds in this area 11/23/14 no debridement. The area on the right ankle has enlarged. The left ankle wound appears stable in terms of the surface although there is periwound inflammation. There has been regression in the amount of new skin 11/30/14 no debridement. Both wound areas appear healthy. There was no evidence of infection. The the new area on the right medial ankle has enlarged although that both the surfaces appear to be stable. 12/07/14; Debridement of the right medial ankle wound. No no debridement was done on the left. 12/14/14 no major change in and now bilateral medial ankle wounds. Both of these are very painful but the no overt evidence of infection. She has had previous venous ablation 12/21/14; patient states that her right medial ankle wound is considerably more painful last week than usual. Her left is also somewhat painful. She could not  tolerate debridement. The right medial ankle wound has fibrinous surface eschar 12/28/14 this is a patient with severe bilateral venous insufficiency ulcers. For a considerable period of time we actually had the one on the right medial ankle healed however this recently opened up again in June. The left medial ankle wound has been a refractory area with some absent flows. We had some success with Hydrofera Blue on this area and it literally closed by 50% however it is recently opened up Foley. Both of these were debridement today of surface eschar. She tolerates this poorly 01/25/15: No change in the status of this. Thick adherent escar. Very poor tolerance of any attempt at debridement. I had healed the right medial malleolus wound for a considerable amount of time and had the left one down to about 50% of the volume although this is totally regressed over the last 48 weeks. Further the right  leg has reopened. she is trying to make a appointment with pain and vascular, previous ablations with Dr. Aleda Grana. I do not believe there is an arterial insufficiency issue here 02/01/15 the status of the adherent eschar bilaterally is actually improved. No debridement was done. She did not manage to get vascular studies done 02/08/15 continued debridement of the area was done today. The slough is less adherent and comes off with less pressure. There is no surrounding infection peripheral pulses are intact 02/15/15 selective debridement with a disposable curette. Again the slough is less adherent and comes off with less difficulty. No surrounding infection peripheral pulses are intact. 02/22/15 selective debridement of the right medial ankle wound. Slough comes off with less difficulty. No obvious surrounding infection peripheral pulses are intact I did not debridement the one on the left. Both of these are stable to improved 03/01/15 selective debridement of both wound areas using a curette to. Adherent  slough cup soft with less difficulty. No obvious surrounding infection. The patient tells me that 2 days ago she noted a rash above the right leg wrap. She did not have this on her lower legs when she change this over she arrives with widespread left greater than right almost folliculitis-looking rash which is extremely pruritic. I don't see anything to culture here. There is no rash on the rest of her body. She feels well systemically. 03/08/15; selective debridement of both wounds using a curette. Base of this does not look unhealthy. She had limegreen drainage coming out of the left leg wound and describes a lot of drainage. The rash on her left leg looks improved to. No cultures were done. 03/22/15; patient was not here last week. Basal wounds does not look healthy and there is no surrounding erythema. No drainage. There is still a rash on the left leg that almost looks vasculitic however it is clearly limited to the top of where the wrap would be. 04/05/15; on the right required a surgical debridement of surface eschar and necrotic subcutaneous tissue. I did not debridement the area on the left. These continue to be large open wounds that are not changing that much. We were successful at one point in healing the area on the right, and at the same time the area on the left was roughly half the size of current measurements. I think a lot of the deterioration has to do with the prolonged time the patient is on her feet at work 04/19/15 I attempted-like surface debridement bilaterally she does not tolerate this. She tells me that she was in allergic care yesterday with extreme pain over her left lateral malleolus/ankle and was told that she has an "sprain" 05/03/15; large bilateral venous insufficiency wounds over the medial malleolus/medial aspect of her ankles. She complains of copious amounts of drainage and his usual large amounts of pain. There is some increasing erythema around the wound on  the right extending into the medial aspect of her foot to. historically she came in with these wounds the right one healed and the left one came down to roughly half its current size however the right one is reopened and the left is expanded. 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 05/10/15 large bilateral venous insufficiency wounds. There is less adherence surface left however the surface culture that I did last week grew pseudomonas therefore bilateral selective debridement score necessary. There is surrounding erythema. The patient describes severe  bilateral drainage and a lot of pain in the left ankle. Apparently her podiatrist was were ready to do a cortisone shot 05/17/15; the patient complains of pain and again copious amounts of drainage. 05/24/15; we used Iodo flex last week. Patient notes considerable improvement in wound drainage. Only needed to change this once. 05/31/15; we continued Iodoflex; the base of these large wounds bilaterally is not too bad but there is probably likely a significant bioburden here. I would like to debridement just doesn't tolerate it. 06/06/14 I would like to continue the Iodoflex although she still hasn't managed to obtain supplies. She has bilateral medial malleoli or large wounds which are mostly superficial. Both of them are covered circumferentially with some nonviable fibrinous slough although she tolerates debridement very poorly. She apparently has an appointment for an ablation on the right leg by interventional radiology. 06/14/15; the patient arrives with the wounds and static condition. We attempted a debridement although she does not do well with this secondary to pain. I 07/05/15; wounds are not much smaller however there appears to be a cleaner granulating base. The left has tight fibrinous slough greater than the right. Debridement is tolerated poorly due to pain. Iodoflex is done more for  these wounds in any of the multitude of different dressings I have tried on the left 1 and then subsequently the right. 07/12/15; no change in the condition of this wound. I am able to do an aggressive debridement on the right but not the left. She simply cannot tolerate it. We have been using Iodoflex which helps somewhat. It is worthwhile remembering that at one point we healed the right medial ankle wound and the left was about 25% of the current circumference. We have suggested returning to vascular surgery for review of possible further ablations for one reason or another she has not been able to do this. 07/26/15 no major change in the condition of either wound on her medial ankle. I did not attempt to debridement of these. She has been aggressively scrubbing these while she is in the shower at home. She has her supply of Iodoflex which seems to have done more for these wounds then anything I have put on recently. 08/09/15 wound area appears larger although not verified by measurements. Using Iodoflex 09/05/2015 -- she was here for avisit today but had significant problems with the wound and I was asked to see her for a physician opinion. I have summarize that this lady has had surgery on her left lower extremity about 10 years ago where the possible veins stripping was done. She has had an opinion from interventional radiology around November 2016 where no further sclerotherapy was ordered. The patient works 12 hours a day and stands on a concrete floor with work boots and is unable to get the proper compression she requires and cannot elevate her limbs appropriately at any given time. She has recently grown Pseudomonas from her wound culture but has not started her ciprofloxacin which was called in for her. 09/13/15 this continues to be a difficult situation for this patient. At one point I had this wound down to a 1.5 x 1.5" wound on her left leg. This is deteriorated and the right leg has  reopened. She now has substantial wounds on her medial calcaneus, malleoli and into her lower leg. One on the left has surface eschar but these are far too painful for me to debridement here. She has a vascular surgery appointment next week to see if anything  can be done to help here. I think she has had previous ablations several years ago at [facility]. She has no major edema. She tells me that she did not get product last time [facility]] and went for several days without it. She continues to work in work boots 12 hours a day. She cannot get compression/4-layer under her work boots. 09/20/15 no major change. Periwound edema control was not very good. Her point with pain and vascular is next Wednesday the 25th 09/28/15; the patient is seen vascular surgery and is apparently scheduled for repeat duplex ultrasounds of her bilateral lower legs next week. 10/05/15; the patient was seen by Dr. Doren Custard of vascular surgery. He feels that she should have arterial insufficiency excluded as cause/contributed to her nonhealing stage she is therefore booked for an arteriogram. She has apparently monophasic signals in the dorsalis pedis pulses. She also of course has known severe chronic venous insufficiency with previous procedures as noted previously. I had another long discussion with the patient today about her continuing to work 12 hour shifts. I've written her out for 2 months area had concerns about this as her work location is currently undergoing significant turmoil and this may lead to her termination. She is aware of this however I agree with her that she simply cannot continue to stand for 12 hours multiple days a week with the substantial wound areas she has. 10/19/15; the Dr. Doren Custard appointment was largely for an arteriogram which was normal. She does not have an arterial issue. He didn't make a comment about her chronic venous insufficiency for which she has had previous ablations. Presumably it  was not felt that anything additional could be done. The patient is now out of work as I prescribed 2 weeks ago. Her wounds look somewhat less aggravated presumably because of this. I felt I would give debridement another try today 10/25/15; no major change in this patient's wounds. We are struggling to get her product that she can afford into her own home through her insurance. 11/01/15; no major change in the patient's wounds. I have been using silver alginate as the most affordable product. I spoke to Dr. Marla Roe last week with her requested take her to the OR for surgical debridement and placement of ACEL. Dr. Marla Roe told me that she would be willing to do this however [facility] will not cover this, fortunately the patient has [place] healthcare of some variant 11/08/15; no major change in the patient's wounds. She has been completely nonviable surface that this but is in too much pain with any attempted debridement are clinic. I have arranged for her to see Dr. Marla Roe ham of plastic surgery and this appointment is on Monday. I am hopeful that they will take her to the OR for debridement, possible ACEL ultimately possible skin graft 11/22/15 no major change in the patient's wounds over her bilateral medial calcaneus medial malleolus into the lower legs. Surface on these does not look too bad however on the left there is surrounding erythema and tenderness. This may be cellulitis or could him sleepy tinea. 11/29/15; no major changes in the patient's wounds over her bilateral medial malleolus. There is no infection here and I don't think any additional antibiotics are necessary. There is now plan to move forward. She sees Dr. Marla Roe in a week's time for preparation for operative debridement and ACEL placement I believe on 7/12. She then has a follow-up appointment with Dr. Marla Roe on 7/21 12/28/15; the patient  returns today having been taken to the OR by Dr. Marla Roe  12/12/15 she underwent debridement, intraoperative cultures [which were negative]. She had placement of a wound VAC. Parent really ACEL was not available to be placed. The wound VAC foam apparently adhered to the wound since then she's been using silver alginate, Xeroform under Ace wraps. She still says there is a lot of drainage and a lot of pain 01/31/16; this is a patient I see monthly. I had referred her to Dr. Marla Roe him of plastic surgery for large wounds on her bilateral medial ankles. She has been to the OR twice once in early July and once in early August. She tells me over the last 3 weeks she has been using the wound VAC with ACEL underneath it. On the right we've simply been using silver alginate. Under Kerlix Coban wraps. 02/28/16; this is a patient I'm currently seeing monthly. She is gone on to have a skin graft over her large venous insufficiency ulcer on the left medial ankle. This was done by Dr. Marla Roe him. The patient is a bit perturbed about why she didn't have one on her right medial ankle wound. She has been using silver alginate to this. 03/06/16; I received a phone call from her plastic surgery Dr. Marla Roe. She expressed some concern about the viability of the skin graft she did on the left medial ankle wound. Asked me to place Endoform on this. She told me she is not planning to do a subsequent skin graft on the right as the left one did not take very well. I had placed Hydrofera Blue on the right 03/13/16; continue to have a reasonably healthy wound on the right medial ankle. Down to 3 mm in terms of size. There is epithelialization here. The area on the left medial ankle is her skin graft site. I suppose the last week this looks somewhat better. She has an open area inferiorly however in the center there appears to be some viable tissue. There is a lot of surface callus and eschar that will eventually need to come off however none of this looked to be infected.  Patient states that the is able to keep the dressing on for several days which is an improvement. 03/20/16 no major change in the circumference of either wound however on the left side the patient was at Dr. Eusebio Friendly office and they did a debridement of left wound. 50% of the wound seems to be epithelialized. I been using Endoform on the left Hydrofera Blue in the right 03/27/16; she arrives today with her wound is not looking as healthy as they did last week. The area on the right clearly has an adherent surface to this a very similar surface on the left. Unfortunately for this patient this is all too familiar problem. Clearly the Endoform is not working and will need to change that today that has some potential to help this surface. She does not tolerate debridement in this clinic very well. She is changing the dressing wants 04/03/16; patient arrives with the wounds looking somewhat better especially on the right. Dr. Migdalia Dk change the dressing to silver alginate when she saw her on Monday and also sold her some compression socks. The usefulness of the latter is really not clear and woman with severely draining wounds. 04/10/16; the patient is doing a bit of an experiment wearing the compression stockings that Dr. Migdalia Dk provided her to her left leg and the out of legs based dressings that we provided  to the right. 05/01/16; the patient is continuing to wear compression stockings Dr. Migdalia Dk provided her on the left that are apparently silver impregnated. She has been using Iodoflex to the right leg wound. Still a moderate amount of drainage, when she leaves here the wraps only last for 4 days. She has to change the stocking on the left leg every night 05/15/16; she is now using compression stockings bilaterally provided by Dr. Marla Roe. She is wearing a nonadherent layer over the wounds so really I don't think there is anything specific being done to this now. She has some reduction on the  left wound. The right is stable. I think all healing here is being done without a specific dressing 06/09/16; patient arrives here today with not much change in the wound certainly in diameter to large circular wounds over the medial aspect of her ankle bilaterally. Under the light of these services are certainly not viable for healing. There is no evidence of surrounding infection. She is wearing compression stockings with some sort of silver impregnation as prescribed by Dr. Marla Roe. She has a follow-up with her tomorrow. 06/30/16; no major change in the size or condition of her wounds. These are still probably covered with a nonviable surface. She is using only her purchase stockings. She did see Dr. Marla Roe who seemed to want to apply Dakin's solution to this I'm not extreme short what value this would be. I would suggest Iodoflex which she still has at home. 07/28/16; I follow Mrs. Shipley episodically along with Dr. Marla Roe. 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 She has been applying a topical collagen ointment to both wounds with Adaptic. I don't think Dr. Marla Roe is planning to take her back to the OR. 08/19/16; I follow Mrs. Jeneen Rinks on a monthly basis along with Dr. Marla Roe of plastic surgery. 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 I been following her for a number of years. At one point I was able to get the right medial malleolus wound to heal and had the left medial malleolus down to about half its current size however and I had to send her to plastic surgery for an operative debridement. Since then things have been stable to slightly improve the area on the right is slightly better one in the left about the same although there is much less adherent surface than I'm used to with this patient. She is using some form of liquid collagen gel  that Dr. Marla Roe provided a Kerlix cover with the patient's own pressure stockings. She tells me that she has extreme pain in both ankles and along the lateral aspect of both feet. She has been unable to work for some period of time. She is telling me she is retiring at the beginning of April. She sees Dr. Doran Durand of orthopedics next week 09/22/16; patient has not seen Dr. Marla Roe since the last time she is here. I'm not really sure what she is using to the wounds other than bits and pieces of think she had left over including most recently Hydrofera Blue. She is using juxtalite stockings. She is having difficulty with her husband's recent illness "stroke". She is having to transport him to various doctors appointments. Dr. Marla Roe left her the option of a repeat debridement with ACEL however she has not been able to get the time to follow-up on this. She continues to have a fair  amount of drainage out of these wounds with certainly precludes leaving dressings on all week 10/13/16; patient has not seen Dr. Marla Roe since she was last in our clinic. I'm not really sure what she is doing with the wounds, we did try to get her [facility] and I think she is actually using this most of the time. Because of drainage she states she has to change this every second day although this is an improvement from what she used to do. She went to see Dr. Doran Durand who did not think she had a muscular issue with regards to her feet, he referred her to a neurologist and I think the appointment is sometime in June. I changed her back to Iodoflex which she has used in the past but not recently. 11/03/16; the patient has been using Iodoflex although she ran out of this. Still claims that there is a lot of drainage although the wound does not look like this. No surrounding erythema. She has not been back to see Dr. Marla Roe 11/24/16; the patient has been using Iodoflex again but she ran out of it 2 or 3 days ago.  There is no major change in the condition of either one of these wounds in fact they are larger and covered in a thick adherent surface slough/nonviable tissue especially on the left. She does not tolerate mechanical debridement in our clinic. Going back to see Dr. Marla Roe of plastic surgery for an operative debridement would seem reasonable. 12/15/16; the patient has not been back to see Dr. Marla Roe. She is been dealing with a series of illnesses and her husband which of monopolized her time. She is been using Sorbact which we largely supplied. She states the drainage is bad enough that it maximum she can go 2-3 days without changing the dressing 01/12/2017 -- the patient has not been back for about 4 weeks and has not seen Dr. Marla Roe not does she have any appointment pending. 01/23/17; patient has not seen Dr. Marla Roe even though I suggested this previously. She is using Santyl that was suggested last week by Dr. Con Memos this Cost her $16 through her insurance which is indeed surprising 02/12/17; continuing Santyl and the patient is changing this daily. A lot of drainage. She has not been back to see plastic surgery she is using an Ace wrap. Our intake nurse suggested wrap around stockings which would make a good reasonable alternative 02/26/17; patient is been using Santyl and changing this daily due to drainage. She has not been to see plastic surgery she uses in April Ace wrap to control the edema. She did obtain extremitease stockings but stated that the edema in her leg was to big for these 03/20/17; patient is using Santyl and Anasept. Surfaces looked better today the area on the right is actually measuring a little smaller. She has states she has a lot of pain in her feet and ankles and is asking for a consult to pain control which I'll try to help her with through our case manager. 04/10/17; the patient arrives with better-looking wound surfaces and is slightly smaller wound on  the left she is using a combination of Santyl and Anasept. She has an appointment or at least as started in the pain control center associated with [facility] 05/14/17; this is a patient who I followed for a prolonged period of time. 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 At one point I had this down to a much smaller wound on  the left however these reopened and we've never been able to get these to heal. She has been using Santyl and Anasept gel although 2 weeks ago she ran out of the Anasept gel. She has a stable appearance of the wound. She is going to the wound care clinic at [facility]. They wanted do a nerve block/spinal block although she tells me she is reluctant to go forward with that. 05/21/17; this is a patient I have followed for many years. 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 Chronic pain and deformity in her ankles as well. She is been to see plastic surgery as well as orthopedics. Using PolyMem AG most recently/Kerramax/ABDs and 2 layer compression. She has managed to keep this on and she is coming in for a nurse check to change the dressing on Tuesdays, we see her on Fridays 06/05/17; really quite a good looking surface and the area especially on the right medial has contracted in terms of dimensions. Well granulated healthy-looking tissue on both sides. Even with an open curet there is nothing that even feels abnormal here. This is as good as I've seen this in quite some time. We have been using PolyMem AG and bringing her in for a nurse check 06/12/17; really quite good surface on both of these wounds. The right medial has contracted a bit left is not. We've been using PolyMem and AG and she is coming in for a nurse visit 06/19/17; we have been using PolyMem AG and bringing her in for a nurse check. Dimensions of her wounds are not better but the surfaces looked better bilaterally. She complained of bleeding  last night and the left wound and increasing pain bilaterally. She states her wound pain is more neuropathic than just the wounds. There was some suggestion that this was radicular from her pain management doctor in talking to her it is really difficult to sort this out. 06/26/17; using PolyMem and AG and bringing her in for a nurse check as All of this and reasonably stable condition. Certainly not improved. The dimensions on the lateral part of the right leg look better but not really measuring better. The medial aspect on the left is about the same. 07/03/16; we have been using PolyMen AG and bringing her in for a nurse check to change the dressings as the wounds have drainage which precludes once weekly changing. We are using all secondary absorptive dressings.our intake nurse is brought up the idea of using a wound VAC/snap VAC on the wound to help with the drainage to see if this would result in some contraction. This is not a bad idea. The area on the right medial is actually looking smaller. Both wounds have a reasonable-looking surface. There is no evidence of cellulitis. The edema is well controlled 07/10/17; the patient was denied for a snap VAC by her insurance. The major issue with these wounds continues to be drainage. We are using wicked PolyMem AG and she is coming in for a nurse visit to change this. The wounds are stable to slightly improved. The surface looks vibrant and the area on the right certainly has shrunk in size but very slowly 07/17/17; the patient still has large wounds on her bilateral medial malleoli. Surface of both of these wounds looks better. The dimensions seem to come and go but no consistent improvement. There is no epithelialization. We do not have options for advanced treatment products due to insurance issues. They did not approve of the  wound VAC to help control the drainage. More recently we've been using PolyMem and AG wicked to allow drainage through. We  have been bringing her in for a nurse visit to change this. We do not have a lot of options for wound care products and the home again due to insurance issues 07/24/17; the patient's wound actually looks somewhat better today. No drainage measurements are smaller still healthy-looking surface. We used silver collagen under PolyMen started last week. We have been bringing her in for a dressing change 07/31/17; patient's wound surface continued to look better and I think there is visible change in the dimensions of the wound on the right. Rims of epithelialization. We have been using silver collagen under PolyMen and bringing her in for a dressing change. There appears to be less drainage although she is still in need of the dressing change 08/07/17. Patient's wound surface continues to look better on both sides and the area on the right is definitely smaller. We have been using silver collagen and PolyMen. She feels that the drainage has been it has been better. I asked her about her vascular status. She went to see Dr. Aleda Grana at [facility] and had some form of ablation. I don't have much detail on this. I haven't my notes from 2016 that she was not a candidate for any further ablation but I don't have any more information on this. We had referred her to vein and vascular I don't think she ever went. He does not have a history of PAD although I don't have any information on this either. We don't even have ABIs in our record 08/14/17; we've been using silver collagen and PolyMen cover. And putting the patient and compression. She we are bringing her in as a nurse visit to change this because ofarge amount of drainage. We didn't the ABIs in clinic today since they had been done in many moons 1.2 bilaterally. She has been to see vein and vascular however this was at [facility] and she had ablation although I really don't have any information on this all seemed biking get a report. She is also  been operatively debrided by plastic surgery and had a cell placed probably 8-12 months ago. This didn't have a major effect. We've been making some gains with current dressings 08/19/17-She is here in follow-up evaluation for bilateral medial malleoli ulcers. She continues to tolerate debridement very poorly. We will continue with recently changed topical treatment; if no significant improvement may consider switching to Iodosorb/Iodoflex. 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 She has been using silver collagen and PolyMem. I believe she has been used and tried on Iodoflex before. 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 These have been very refractory over time. We have been using silver collagen and PolyMen. She is coming in once a week for a doctor's and once a week for nurse visits. We are actually making some progress 09/18/17; the patient's wounds are smaller especially on the right medial. She arrives today to upset to consider even washing these off with Anasept which I think is been part of the reason this is been closing. We've been using collagen covered in PolyMen otherwise. It is noted that she has a small area of folliculitis on the right  medial calf that. As we are wrapping her legs I'll give her a short course of doxycycline to make sure this doesn't amount to anything. She is a long list of complaints today including imbalance, shortness of breath on exertion, inversion of her left ankle. With regards to the latter complaints she is been to see orthopedics and they offered her a tendon release surgery I believe but wanted her wounds to be closed first. I have recommended she go see her primary physician with regards to everything  else. 09/25/17; patient's wounds are about the same size. We have made some progress bilaterally although not in recent weeks. She will not allow me T wash these wounds with Anasept even if she is doing her cell. Wheeze we've been using collagen covered in PolyMen. Last week she had a small area of folliculitis this is now opened into a small wound. She completed 5 days of trimethoprim sulfamethoxazole 10/02/17; unfortunately the area on her left medial ankle is worse with a larger wound area towards the Achilles. The patient complains of a lot of pain. She will not allow debridement although visually I don't think there is anything to debridement in any case. We have been using silver collagen and PolyMen for several months now. Initially we are making some progress although I'm not really seeing that today. We will move back to [facility]. His admittedly this is a bit of a repeat however I'm hoping that his situation is different now. The patient tells me she had her leg on the left give out on her yesterday this is process some pain. 10/09/17; the patient is seen twice a week largely because of drainage issues coming out of the chronic medial bimalleolar wounds that are chronic. Last week the dimensions of the one on the left looks a little larger I changed her to [facility]. She comes in today with a history of terrible pain in the bilateral wound areas. She will not allow debridement. She will not even allow a tissue culture. There is no surrounding erythema no no evidence of cellulitis. We have been putting her Kerlix Coban man. She will not allow more aggressive compression as there was a suggestion to put her in 3 layer wraps. 10/16/17; large wounds on her bilateral medial malleoli. These are chronic. Not much change from last week. The surface looks have healthy but absolutely no epithelialization. A lot of pain little less so of drainage. She will not allow debridement or even washing  these off in the vigorous fashion with Anasept. 10/23/17; large wounds on her bilateral malleoli which are chronic. Some improvement in terms of size perhaps on the right since last time I saw these. She states that after we increased the 3 layer compression there was some bleeding, when she came in for a nurse visit she did not want 3 layer compression put back on about our nurse managed to convince her. She has known chronic venous visit issues and I'm hoping to get her to tolerate the 3 layer compression. using Hydrofera Blue 10/30/17; absolutely no change in the condition of either wound although we've had some improvement in dimensions on the right.. Attempted to put her in 3 layer compression she didn't tolerated she is back in 2 layer compression. We've been using Hydrofera Blue We looked over her past records. She had venous reflux studies in November 2016. There was no evidence of deep venous reflux on the right. Superficial vein did not show the greater saphenous vein  at think this is been previously ablated the small saphenous vein was within normal limits. The left deep venous system showed no DVT the vessels were positive for deep venous reflux in the posterior tibial veins at the ankle. The greater saphenous vein was surgically absent small saphenous vein was within normal limits. She went to vein and vascular at [facility]. I believe she had an ablation on the left greater saphenous vein. I'll update her reflux studies perhaps ever reviewed by vein and vascular. We've made absolutely no progress in these wounds. Will also try to read and TheraSkins through her insurance 11/06/17; W the patient apparently has a 2 week follow-up with vein and vascular I like him to review the whole issue with regards to her previous vascular workup by Dr. Aleda Grana. We've really made no progress on these wounds in many months. She arrives today with less viable looking surface on the left medial  ankle wound. This was apparently looking about the same on Tuesday when she was here for nurse visit. 11/13/17; deep tissue culture I did last time of the left lower leg showed multiple organisms without any predominating. In particular no Staphylococcus or group A strep were isolated. We sent her for venous reflux studies. She's had a previous left greater saphenous vein stripping and I think sclerotherapy of the right greater saphenous vein. She didn't really look at the lesser saphenous vein this both wounds are on the medial aspect. She has reflux in the common femoral vein and popliteal vein and an accessory vein on the right and the common femoral vein and popliteal vein on the left. I'm going to have her go to see vein and vascular just the look over things and see if anything else beside aggressive compression is indicated here. We have not been able to make any progress on these wounds in spite of the fact that the surface of the wounds is never look too bad. 11/20/17; no major change in the condition of the wounds. Patient reports a large amount of drainage. She has a lot of complaints of pain although enlisting her today I wonder if some of this at least his neuropathic rather than secondary to her wounds. She has an appointment with vein and vascular on 12/30/17. The refractory nature of these wounds in my mind at least need vein and vascular to look over the wounds the recent reflux studies we did and her history to see if anything further can be done here. I also note her gait is deteriorated quite a bit. 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 She has a bilateral Trendelenburg gait. I wonder if this is neuropathic or perhaps multilevel radicular. 11/27/17; her wounds actually looks slightly better. Healthy-looking granulation tissue a scant amount of epithelialization. [place] healthcare will not pay for [employer]. They will play for tri layer Oasis and Dermagraft. This is  not a diabetic ulcer. We'll try for the tri layer Oasis. She still complains of some drainage. She has a vein and vascular appointment on 12/30/17 12/04/17; the wounds visually look quite good. Healthy-looking granulation with some degree of epithelialization. We are still waiting for response to our request for trial to try layer Oasis. Her appointment with vascular to review venous and arterial issues isn't sold the end of July 7/31. Not allow debridement or even vigorous cleansing of the wound surface. 12/18/17; slightly smaller especially on the right. 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 We've been using Hydrofera  Blue. We still are looking into triple layer Oasis through her insurance 01/08/18 on evaluation today patient's wound actually appears to be showing signs of good improvement at this point in time. She has been tolerating the dressing changes without complication. Fortunately there does not appear to be any evidence of infection at this point in time. We have been utilizing silver nitrate which does seem to be of benefit for her which is also good news. Overall I'm very happy with how things seem to be both regards appearance as well as measurement. Patient did see Dr. Bridgett Larsson for evaluation on 12/30/17. In his assessment he felt that stripping would not likely add much more than chronic compression to the patient's healing process. His recommendation was to follow-up in three months with Dr. Doren Custard if she hasn't healed in order to consider referral back to you and see vascular where she previously was in a trial and was able to get her wound to heal. I'll be see what she feels she when you staying compression and he reiterated this as well. 01/13/18 on evaluation today patient appears to actually be doing very well in regard to her bilateral medial malleolus ulcers. She seems to have tolerated the chemical cauterization with silver nitrate last week she did  have some pain through that evening but fortunately states that I'll be see since it seems to be doing better she is overall pleased with the progress. 01/21/18; really quite a remarkable improvement since I've last seen these wounds. We started using silver nitrate specially on the islands of hyper granulation which for some reason her around the wound circumference. This is really done quite nicely. Primary dressing Hydrofera Blue under 4 layer compression. She seems to be able to hold out without a nurse rewrap. Follow-up in 1 week 01/28/18; we've continued the hydrofera blue but continued with chemical cauterization to the wound area that we started about a month ago for irregular hyper granulation. She is made almost stunning improvement in the overall wound dimensions. I was not really expecting this degree of improvement in these chronic wounds 02/05/18; we continue with Hydrofera Bluebut of also continued the aggressive chemical cauterization with silver nitrate. We made nice progress with the right greater than left wound. 02/12/18. We continued with Hydrofera Blue after aggressive chemical cauterization with silver nitrate. We appear to be making nice progress with both wound areas 02/19/2018; we continue with [facility] after washing the wounds vigorously with Anasept spray and chemical cauterization with silver nitrate. We are making excellent progress. The area on the right's just about closed 02/26/2018. The area on the left medial ankle had too much necrotic debris today. I used a #5 curette we are able to get most of the soft. I continued with the silver nitrate to the much smaller wound on the right medial ankle she had a new area on her right lower pretibial area which she says was due to a role in her compression 03/05/2018; both wound areas look healthy. Not much change in dimensions from last week. I continue to use silver nitrate and Hydrofera Blue. The patient saw Dr. Doren Custard of  vein and vascular. He felt she had venous stasis ulcers. He felt based on her previous arteriogram she should have adequate circulation for healing. Also she has deep venous reflux but really no significant correctable superficial venous reflux at this time. He felt we should continue with conservative management including leg elevation and compression 04/02/2018; since we last saw this woman  about a month ago she had a fall apparently suffered a pelvic fracture. I did not look up the x-ray. Nevertheless because of pain she literally was bedbound for 2 weeks and had home health coming out to change the dressing. Somewhat predictably this is resulted in considerable improvement in both wound areas. The right is just about closed on the medial malleolus and the left is about half the size. 04/16/2018; both her wounds continue to go down in size. Using Hydrofera Blue. 05/07/18; both her wounds appeared to be improving especially on the right where it is almost closed. We are using Hydrofera Blue 05/14/2018; slightly worse this week with larger wounds. Surface on the left medial not quite as good. We have been using Hydrofera Blue 05/21/18; again the wounds are slightly larger. Left medial malleolus slightly larger with eschar around the circumference. We have been using Hydrofera Blue undergoing a wraps for a prolonged period of time. This got a lot better when she was more recumbent due to a fall and a back injury. I change the primary dressing the silver alginate today. She did not tolerate a 4 layer compression previously although I may need to bring this up with her next time 05/28/2018; area on the left medial malleolus again is slightly larger with more drainage. Area on the right is roughly unchanged. She has a small area of folliculitis on the right medial just on the lower calf. This does not look ominous. 06/03/2018 left medial malleolus slightly smaller in a better looking surface. We used  silver nitrate on this last time with silver alginate. The area on the right appears slightly smaller 1/10; left medial malleolus slightly smaller. Small open area on the right. We used silver nitrate and silver alginate as of 2 weeks ago. We continue with the wound and compression. These got a lot better when she was off her feet 1/17; right medial malleolus wound is smaller. The left may be slightly smaller. Both surfaces look somewhat better. 1/24; both wounds are slightly smaller. Using silver alginate under Unna boots 1/31; both wounds appear smaller in fact the area on the right medial is just about closed. Surface eschar. We have been using silver alginate under Unna boots. The patient is less active now spends let much less time on her feet and I think this is contributed to the general improvement in the wound condition 2/7; both wounds appear smaller. I was hopeful the right medial would be closed however there there is still the same small open area. Slight amount of surface eschar on the left the dimensions are smaller there is eschar but the wound edges appear to be free. We have been using silver alginate under Unna boot's 2/14; both wounds once again measure smaller. Circumferential eschar on the left medial. We have been using silver alginate under Unna boots with gradual improvement 2/21; the area on the right medial malleolus has healed. The area on the left is smaller. We have been using silver alginate and Unna boots. We can discharge wrapping the right leg she has 20/30 stockings at home she will need to protect the scar tissue in this area 2/28; the area on the right medial malleolus remains closed the patient has a compression stocking. The area on the left is smaller. We have been using silver alginate and Unna boots. 3/6 the area on the right medial ankle remains closed. Good edema control noted she is using her own compression stocking. The area on the left medial  ankle  is smaller. We have been managing this with silver alginate and Unna boots which we will continue today. 3/13; the area on the right medial ankle remains closed and I'm declaring it healed today. When necessary the left is about the same still a healthy-looking surface but no major change and wound area. No evidence of infection and using silver alginate under unna and generally making considerable improvement 3/27 the area on the right medial ankle remains closed the area on the left is about the same as last week. Certainly not any worse we have been using silver alginate under an Unna boot 4/3; the area on the right medial ankle remains closed per the patient. We did not look at this wound. The wound on the left medial ankle is about the same surface looks healthy we have been using silver alginate under an Unna boot 4/10; area on the right medial ankle remains closed per the patient. We did not look at this wound. The wound on the left medial ankle is slightly larger. The patient complains that the [facility] caused burning pain all week. She also told us that she was a lot more active this week. Changed her back to silver alginate 4/17; right medial ankle still closed per the patient. Left medial ankle is slightly larger. Using silver alginate. She did not tolerate Hydrofera Blue on this area 4/24; right medial ankle remains closed we have not look at this. The left medial ankle continues to get larger today by about a centimeter. We have been using silver alginate under Unna boots. She complains about 4 layer compression as an alternative. She has been up on her feet working on her garden 5/8; right medial ankle remains closed we did not look at this. The left medial ankle has increased in size about 100%. We have been using silver alginate under Unna boots. She noted increased pain this week and was not surprised that the wound is deteriorated 5/15; no major change in SA however much  less erythema ( one week of doxy ocellulitis). 5/22-63 year old female returns at 1 week to the clinic for left medial ankle wound for which we have been using silver alginate under 3 layer compression She was placed on DOXY at last visit - the wound is wider at this visit. She is in 3 layer compression 5/29; change to [facility] last week. I had given her empiric doxycycline 2 weeks ago for a week. She is in 3 layer compression. She complains of a lot of pain and drainage on presentation today. 6/5; using Hydrofera Blue. I gave her doxycycline recently empirically for erythema and pain around the wound. Believe her cultures showed enterococcus which not would not have been well covered by doxycycline nevertheless the wound looks better and I don't feel specifically that the enterococcus needs to be covered. She has a new what looks like a wrap injury on her lateral left ankle. 6/12; she is using Hydrofera Blue. She has a new area on the left anterior lower tibial area. This was a wrap injury last week. 6/19; the patient is using Hydrofera Blue. She arrived with marked inflammation and erythema around the wound and tenderness. 12/01/18 on evaluation today patient appears to be doing a little bit better based on what I'm hearing from the standpoint of lassos evaluation to this as far as the overall appearance of the wound is concerned. Then sometime substandard she typically sees Dr. Dellia Nims. Nonetheless overall very pleased with the progress that  she's made up to this point. No fevers, chills, nausea, or vomiting noted at this time. 7/10; some improvement in the surface area. Aggressively debrided last week apparently. I went ahead with the debridement today although the patient does not tolerate this very well. We have been using Iodoflex. Still a fair amount of drainage 7/17; slightly smaller. Using Iodoflex. 7/24; no change from last week in terms of surface area. We have been using  Iodoflex. Surface looks and continues to look somewhat better 7/31; surface area slightly smaller better looking surface. We have been using Iodoflex. This is under Unna boot compression 8/7-Patient presents at 1 week with Unna boot and Iodoflex, wound appears better 8/14-Patient presents at 1 week with Iodoflex, we use the Unna boot, wound appears to be stable better.Patient is getting Botox treatment for the inversion of the foot for tendon release, Next week 8/21; we are using Iodoflex. Unna boot. The wound is stable in terms of surface area. Under illumination there is some areas of the wound that appear to be either epithelialized or perhaps this is adherent slough at this point I was not really clear. It did not wipe off and I was reluctant to debride this today. 8/28; we are using Iodoflex in an Unna boot. Seems to be making good improvement. 9/4; using Iodoflex and wound is slightly smaller. 9/18; we are using Iodoflex with topical silver nitrate when she is here. The wound continues to be smaller 10/2; patient missed her appointment last week due to GI issues. She left and Iodoflex based dressing on for 2 weeks. Wound is about the same size about the size of a dime on the left medial lower 10/9 we have been using Iodoflex on the medial left ankle wound. She has a new superficial probable wrap injury on the dorsal left ankle 10/16; we have been using Hydrofera Blue since last week. This is on the left medial ankle 10/23; we have been using Hydrofera Blue since 2 weeks ago. This is on the left medial ankle. Dimensions are better Objective Constitutional Vitals Time Taken: 9:58 AM, Height: 68 in, Weight: 127 lbs, BMI: 19.3, Temperature: 97.9 F, Pulse: 71 bpm, Respiratory Rate: 17 breaths/min, Blood Pressure: 119/45 mmHg. Integumentary (Hair, Skin) Wound #3 status is Open. Original cause of wound was Gradually Appeared. The wound is located on the Left,Medial Malleolus. The wound  measures 0.9cm length x 1.3cm width x 0.1cm depth; 0.919cm^2 area and 0.092cm^3 volume. There is Fat Layer (Subcutaneous Tissue) Exposed exposed. There is no tunneling or undermining noted. There is a medium amount of serosanguineous drainage noted. The wound margin is flat and intact. There is large (67-100%) red, pink granulation within the wound bed. There is no necrotic tissue within the wound bed. 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 There was a Haematologist Compression Therapy Procedure by Levan Hurst, RN. Post procedure Diagnosis Wound #3: Same as Pre-Procedure Plan Follow-up Appointments: Return Appointment in 2 weeks. Nurse Visit: - 1 week for rewrap Dressing Change Frequency: Wound #3 Left,Medial Malleolus: Do not change entire dressing for one week. Skin Barriers/Peri-Wound Care: Moisturizing lotion TCA Cream or Ointment - mixed with lotion Wound Cleansing: Clean wound with Wound Cleanser - clean  with anasept with dressing changes May shower with protection. Primary Wound Dressing: Wound #3 Left,Medial Malleolus: Hydrofera Blue Secondary Dressing: Wound #3 Left,Medial Malleolus: Dry Gauze [place] Edema Control: Unna Boot to Left Lower Extremity - no kerlix layer Avoid standing for long periods of time Elevate legs to the level of the heart or above for 30 minutes daily and/or when sitting, a frequency of: - throughout the day Support Garment 20-30 mm/Hg pressure to: - compression stocking right leg daily 1. Continue with Hydrofera Blue 2. We have been using an Haematologist for compression which seems to be working although the patient has  severe underlying venous hypertension most likely I wonder about changing her compression Electronic Signature(s) Signed: 04/24/2019 8:22:52 AM By: Linton Ham MD Entered By: Linton Ham on 04/19/2019 07:35:24 -------------------------------------------------------------------------------- SuperBill Details Patient Name: Date of Service: [name] 03/25/2019 Medical Record [id number] Patient Account Number: [id number] Date of Birth/Sex: Treating RN: Nov 11, 1955 (63 y.o. 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 L97.321 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 breakdown of skin Facility Procedures CPT4 Code: [id number] Description: (Facility Use Only) [id number] - APPLY Louretta Parma BOOT LT Modifier: Quantity: 1 Physician Procedures CPT4 Code Description: [id number] - WC PHYS LEVEL 2 - EST PT ICD-10 Diagnosis Description [id number] 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 breakd L97.322 Non-pressure chronic ulcer of left ankle with fat layer ex Modifier: own of skin posed Quantity: 1 Electronic Signature(s) Signed: 04/24/2019 8:22:52 AM By: Linton Ham MD Previous Signature: 03/25/2019 5:59:58 PM Version By: Levan Hurst RN, BSN Previous Signature: 03/25/2019 6:06:00 PM Version By: Linton Ham MD Entered By: Linton Ham on 04/19/2019 07:35:54

## 2019-03-28 NOTE — Progress Notes (Signed)
[name], [name] ([id number]) Visit Report for 03/25/2019 Arrival Information Details Patient Name: Date of Service: [name], [name] 03/25/2019 10:00 AM Medical Record [id number] Patient Account Number: [id number] Date of Birth/Sex: Treating RN: 07-01-1955 (63 y.o. F) Dwiggins, Larene Beach Primary Care Liara Holm: [name] Other Clinician: [name] Referring Burlon Centrella: Treating Dominick Zertuche/Extender:Robson, Tawny Asal in Treatment: 35 Visit Information History Since Last Visit Cane Added or deleted any medications: No Patient Arrived: 09:57 Any new allergies or adverse reactions: No Arrival Time: Had a fall or experienced change in No Accompanied By: self None activities of daily living that may affect Transfer Assistance: risk of falls: Patient Identification Verified: Yes Signs or symptoms of abuse/neglect since last No Secondary Verification Process Completed: Yes visito Patient Requires Transmission-Based No Hospitalized since last visit: No Precautions: Implantable device outside of the clinic excluding No Patient Has Alerts: No cellular tissue based products placed in the center since last visit: Has Dressing in Place as Prescribed: Yes Has Compression in Place as Prescribed: Yes Pain Present Now: No Electronic Signature(s) Signed: 03/28/2019 5:21:50 PM By: Kela Millin Entered By: Kela Millin on 03/25/2019 09:58:16 -------------------------------------------------------------------------------- Compression Therapy Details Patient Name: Date of Service: [name] 03/25/2019 10:00 AM Medical Record [id number] Patient Account Number: [id number] Date of Birth/Sex: Treating RN: Aug 09, 1955 (63 y.o. [name] Primary Care Liviah Cake: [name] Other Clinician: [name] Referring Jennalynn Rivard: Treating Tannisha Kennington/Extender:Robson, Tawny Asal in Treatment: 328 Compression Therapy  Performed for Wound Wound #3 Left,Medial Malleolus Assessment: Performed By: Clinician Levan Hurst, RN Compression Type: Rolena Infante Post Procedure Diagnosis Same as Pre-procedure Electronic Signature(s) Signed: 03/25/2019 5:59:58 PM By: Levan Hurst RN, BSN Entered By: Levan Hurst on 03/25/2019 10:35:50 -------------------------------------------------------------------------------- Encounter Discharge Information Details Patient Name: Date of Service: [name] 03/25/2019 10:00 AM Medical Record [id number] Patient Account Number: [id number] Date of Birth/Sex: Treating RN: [birth year] (63 y.o. [name] Primary Care Yaira Bernardi: [name] Other Clinician: [name] Referring Cornella Emmer: Treating Hessie Varone/Extender:Robson, Tawny Asal in Treatment: 328 Encounter Discharge Information Items Discharge Condition: Stable Ambulatory Status: Cane Discharge Destination: Home Transportation: Private Auto Accompanied By: self Schedule Follow-up Appointment: Yes Clinical Summary of Care: Patient Declined Electronic Signature(s) Signed: 03/28/2019 5:21:50 PM By: Kela Millin Entered By: Kela Millin on 03/25/2019 19:00:14 -------------------------------------------------------------------------------- Lower Extremity Assessment Details Patient Name: Date of Service: [name], [name] 03/25/2019 10:00 AM Medical Record [id number] Patient Account Number: [id number] Date of Birth/Sex: Treating RN: [birth year] (63 y.o. [name] Primary Care Finnean Cerami: [name] Other Clinician: [name] Referring Duron Meister: Treating Lc Joynt/Extender:Robson, Velva Harman, Essie Christine in Treatment: 328 Edema Assessment Assessed: [Left: No] [Right: No] Edema: [Left: N] [Right: o] Calf Left: Right: Point of Measurement: 33 cm From Medial Instep 29.5 cm cm Ankle Left: Right: Point of Measurement: 12 cm From  Medial Instep 21 cm cm Vascular Assessment Pulses: Dorsalis Pedis Palpable: [Left:Yes] Electronic Signature(s) Signed: 03/28/2019 5:21:50 PM By: Kela Millin Entered By: Kela Millin on 03/25/2019 10:01:35 -------------------------------------------------------------------------------- Multi Wound Chart Details Patient Name: Date of Service: [name] 03/25/2019 10:00 AM Medical Record [id number] Patient Account Number: [id number] Date of Birth/Sex: Treating RN: 1955-06-07 (63 y.o. [name] Primary Care Ramello Cordial: [name] Other Clinician: [name] Referring Que Meneely: Treating Alexiana Laverdure/Extender:Robson, Tawny Asal in Treatment: 328 Vital Signs Height(in): 68 Pulse(bpm): 71 Weight(lbs): 127 Blood Pressure(mmHg): 119/45 Body Mass Index(BMI): 19 Temperature(F): 97.9 Respiratory 17 Rate(breaths/min): Photos: [3:No Photos] [N/A:N/A] Wound Location: [3:Left Malleolus - Medial] [N/A:N/A] Wounding Event: [3:Gradually Appeared] [N/A:N/A] Primary  Etiology: [3:Venous Leg Ulcer] [N/A:N/A] Comorbid History: [3:Congestive Heart Failure, Peripheral Vascular Disease, Congestive Heart Failure, End Stage Renal Disease, Tobacco Use, Chronic Obstructive Pulmonary Disease (COPD), Type 1 Diabetes] [N/A:N/A] Date Acquired: [3:11/15/2012] [N/A:N/A] Weeks of Treatment: [3:328] [N/A:N/A] Wound Status: [3:Open] [N/A:N/A] Measurements L x W x D 0.9x1.3x0.1 [N/A:N/A] (cm) Area (cm) : [3:0.919] [N/A:N/A] Volume (cm) : [3:0.092] [N/A:N/A] % Reduction in Area: [3:70.80%] [N/A:N/A] % Reduction in Volume: [3:85.40%] [N/A:N/A] Classification: [3:Full Thickness Without Exposed Support Structures] [N/A:N/A] Exudate Amount: [3:Medium] [N/A:N/A] Exudate Type: [3:Serosanguineous] [N/A:N/A] Exudate Color: [3:red, brown] [N/A:N/A] Wound Margin: [3:Flat and Intact] [N/A:N/A] Granulation Amount: [3:Large (67-100%)] [N/A:N/A] Granulation Quality:  [3:Red, Pink] [N/A:N/A] Necrotic Amount: [3:None Present (0%)] [N/A:N/A] Exposed Structures: [3:Fat Layer (Subcutaneous N/A Tissue) Exposed: Yes Fascia: No Tendon: No Muscle: No Joint: No Bone: No] Epithelialization: [3:Small (1-33%) Compression Therapy] [N/A:N/A N/A] Treatment Notes Electronic Signature(s) Signed: 03/25/2019 5:59:58 PM By: Levan Hurst RN, BSN Signed: 03/25/2019 6:06:00 PM By: Linton Ham MD Entered By: Linton Ham on 03/25/2019 11:01:00 -------------------------------------------------------------------------------- Multi-Disciplinary Care Plan Details Patient Name: Date of Service: [name] 03/25/2019 10:00 AM Medical Record [id number] Patient Account Number: [id number] Date of Birth/Sex: Treating RN: 1955-08-27 (63 y.o. [name] Primary Care Joshua Soulier: [name] Other Clinician: [name] Referring Arden Tinoco: Treating Akiva Josey/Extender:Robson, Tawny Asal in Treatment: 328 Active Inactive Venous Leg Ulcer Nursing Diagnoses: Actual venous Insuffiency (use after diagnosis is confirmed) Goals: Patient will maintain optimal edema control Date Initiated: 09/04/2017 Target Resolution Date: 04/15/2019 Goal Status: Active Patient/caregiver will verbalize understanding of disease process and disease management Date Initiated: 08/03/2014 Date Inactivated: 08/28/2017 Target Resolution Date: 08/29/2017 Goal Status: Met Interventions: Compression as ordered Treatment Activities: Therapeutic compression applied : 09/21/2015 Notes: Wound/Skin Impairment Nursing Diagnoses: Impaired tissue integrity Knowledge deficit related to ulceration/compromised skin integrity Goals: Patient will have a decrease in wound volume by X% from date: (specify in notes) Date Initiated: 04/05/2015 Date Inactivated: 02/12/2017 Target Resolution Date: 09/01/2016 Unmet Reason: chronic, Goal Status: Unmet unable to tolerate  stronger compression Patient/caregiver will verbalize understanding of skin care regimen Date Initiated: 04/05/2015 Target Resolution Date: 04/15/2019 Goal Status: Active Interventions: Assess patient/caregiver ability to obtain necessary supplies Assess patient/caregiver ability to perform ulcer/skin care regimen upon admission and as needed Assess ulceration(s) every visit Provide education on ulcer and skin care Notes: Electronic Signature(s) Signed: 03/25/2019 5:59:58 PM By: Levan Hurst RN, BSN Entered By: Levan Hurst on 03/25/2019 10:19:27 -------------------------------------------------------------------------------- Pain Assessment Details Patient Name: Date of Service: [name] 03/25/2019 10:00 AM Medical Record [id number] Patient Account Number: [id number] Date of Birth/Sex: Treating RN: 1955-09-23 (63 y.o. [name] Primary Care Katharyn Schauer: [name] Other Clinician: [name] Referring Sabin Gibeault: Treating Makell Cyr/Extender:Robson, Tawny Asal in Treatment: 328 Active Problems Location of Pain Severity and Description of Pain Patient Has Paino No Site Locations Pain Management and Medication Current Pain Management: Electronic Signature(s) Signed: 03/28/2019 5:21:50 PM By: Kela Millin Entered By: Kela Millin on 03/25/2019 09:59:04 -------------------------------------------------------------------------------- Patient/Caregiver Education Details Patient Name: Date of Service: [name] 10/23/2020andnbsp10:00 AM Medical Record [id number] Patient Account Number: [id number] Date of Birth/Gender: Treating RN: Apr 14, 1956 (63 y.o. [name] Primary Care Physician: [name] Other Clinician: [name] Referring Physician: Treating Physician/Extender:Robson, Tawny Asal in Treatment: 67 Education Assessment Education Provided  To: Patient Education Topics Provided Wound/Skin Impairment: Methods: Explain/Verbal Responses: State content correctly [employer]) Signed: 03/25/2019 5:59:58 PM By: Levan Hurst RN, BSN Entered By: Levan Hurst on 03/25/2019 10:19:41 -------------------------------------------------------------------------------- Wound Assessment Details Patient  Name: Date of Service: [name], [name] 03/25/2019 10:00 AM Medical Record [id number] Patient Account Number: [id number] Date of Birth/Sex: Treating RN: Nov 30, 1955 (63 y.o. [name] Primary Care Erica Richwine: [name] Other Clinician: [name] Referring [name]: Treating Sabir Charters/Extender:Robson, Velva Harman, Essie Christine in Treatment: 328 Wound Status Wound Number: 3 Primary Venous Leg Ulcer Etiology: Wound Location: Left Malleolus - Medial Wound Open Wounding Event: Gradually Appeared Status: Date Acquired: 11/15/2012 Comorbid Congestive Heart Failure, Peripheral Vascular Weeks Of Treatment: 328 History: Disease, Congestive Heart Failure, End Stage Clustered Wound: No Renal Disease, Tobacco Use, Chronic Obstructive Pulmonary Disease (COPD), Type 1 Diabetes Photos Wound Measurements Length: (cm) 0.9 % Redu Width: (cm) 1.3 % Redu Depth: (cm) 0.1 Epithe Area: (cm) 0.919 Tunne Volume: (cm) 0.092 Under Wound Description Classification: Full Thickness Without Exposed Support Foul O Structures Slough Wound Flat and Intact Margin: Exudate Medium Amount: Exudate Serosanguineous Type: Exudate red, brown Color: Wound Bed Granulation Amount: Large (67-100%) Granulation Quality: Red, Pink Fascia Necrotic Amount: None Present (0%) Fat La Tendon Muscle Joint Bone Expo dor After Cleansing: No /Fibrino No Exposed Structure Exposed: No yer (Subcutaneous Tissue) Exposed: Yes Exposed: No Exposed: No Exposed: No sed: No ction in Area: 70.8% ction in Volume:  85.4% lialization: Small (1-33%) ling: No mining: No Treatment Notes Wound #3 (Left, Medial Malleolus) 1. Cleanse With Wound Cleanser Soap and water 2. Periwound Care Moisturizing lotion 3. Primary Dressing Applied Hydrofera Blue 4. Secondary Dressing Dry Gauze Foam Kerramax/Xtrasorb 6. Support Layer Teacher, English as a foreign language Boot Notes [place]. no kerlix Electronic Signature(s) Signed: 03/28/2019 4:53:58 PM By: Mikeal Hawthorne EMT/HBOT Signed: 03/28/2019 5:21:50 PM By: Kela Millin Entered By: Mikeal Hawthorne on 03/28/2019 09:10:55 -------------------------------------------------------------------------------- Vitals Details Patient Name: Date of Service: [name] 03/25/2019 10:00 AM Medical Record [id number] Patient Account Number: [id number] Date of Birth/Sex: Treating RN: 1955-08-19 (63 y.o. [name] Primary Care Gerrit Rafalski: [name] Other Clinician: [name] Referring Cozetta Seif: Treating Garris Melhorn/Extender:Robson, Tawny Asal in Treatment: 328 Vital Signs Time Taken: 09:58 Temperature (F): 97.9 Height (in): 68 Pulse (bpm): 71 Weight (lbs): 127 Respiratory Rate (breaths/min): 17 Body Mass Index (BMI): 19.3 Blood Pressure (mmHg): 119/45 Reference Range: 80 - 120 mg / dl Electronic Signature(s) Signed: 03/28/2019 5:21:50 PM By: Kela Millin Entered By: Kela Millin on 03/25/2019 09:58:53

## 2019-03-30 ENCOUNTER — Other Ambulatory Visit: Payer: Self-pay

## 2019-03-30 ENCOUNTER — Encounter (HOSPITAL_BASED_OUTPATIENT_CLINIC_OR_DEPARTMENT_OTHER): Payer: Medicare Other | Admitting: Physician Assistant

## 2019-03-30 DIAGNOSIS — L97322 Non-pressure chronic ulcer of left ankle with fat layer exposed: Secondary | ICD-10-CM | POA: Diagnosis not present

## 2019-04-01 ENCOUNTER — Encounter (HOSPITAL_BASED_OUTPATIENT_CLINIC_OR_DEPARTMENT_OTHER): Payer: Medicare Other | Admitting: Internal Medicine

## 2019-04-06 NOTE — Progress Notes (Signed)
[name], [name] ([id number]) Visit Report for 03/30/2019 SuperBill Details Patient Name: Date of Service: [name], [name] 03/30/2019 Medical Record [id number] Patient Account Number: [id number] Date of Birth/Sex: Treating RN: 11/15/1955 (63 y.o. 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

## 2019-04-08 ENCOUNTER — Other Ambulatory Visit: Payer: Self-pay

## 2019-04-08 ENCOUNTER — Encounter (HOSPITAL_BASED_OUTPATIENT_CLINIC_OR_DEPARTMENT_OTHER): Payer: Medicare Other | Attending: Internal Medicine | Admitting: Internal Medicine

## 2019-04-08 DIAGNOSIS — L97322 Non-pressure chronic ulcer of left ankle with fat layer exposed: Secondary | ICD-10-CM | POA: Insufficient documentation

## 2019-04-08 DIAGNOSIS — L97321 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 breakdown of skin: Secondary | ICD-10-CM | POA: Insufficient documentation

## 2019-04-10 NOTE — Progress Notes (Signed)
[name], [name] ([id number]) Visit Report for 04/08/2019 HPI Details Patient Name: Date of Service: [name], [name] 04/08/2019 10:00 AM Medical Record [id number] Patient Account Number: [id number] Date of Birth/Sex: Treating RN: Jan 24, 1956 (63 y.o. [name] Provider: [name] Other Clinician: Referring Provider: Treating Provider/Extender:, Tawny Asal in Treatment: 330 History of Present Illness HPI Description: the remaining wound is over the left medial ankle. Similar wound over the right medial ankle healed largely with use of Apligraf. Most recently we have been using Hydrofera Blue over this wound with considerable improvement. The patient has been extensively worked up in the past for her venous insufficiency and she is not a candidate for antireflux surgery although I have none of the details available currently. 08/24/14; considerable improvement today. About 50% of this wound areas now epithelialized. The base of the wound appears to be healthier granulation.as opposed to last week when she had deteriorated a considerable improvement 08/17/14; unfortunately the wound has regressed somewhat. The areas of epithelialization from the superior aspect are not nearly as healthy as they were last week. The patient thinks her Hydrofera Blue slipped. 09/07/14; unfortunately the area has markedly regressed in the 2 weeks since I've seen this. There is an odor surrounding erythema. The healthy granulation tissue that we had at the base of the wound now is a dusky color. The nurse reports green drainage 09/14/14; the area looks somewhat better than last week. There is less erythema and less drainage. The culture I did did not show any growth. Nevertheless I think it is better to continue the Cipro and doxycycline for a further week. The remaining wound area was debridement. 09/21/14. Wound did not require debridement last week. Still less  erythema and less drainage. She can complete her antibiotics. The areas of epithelialization in the superior aspect of the wound do not look as healthy as they did some weeks ago 10/05/14 continued improvement in the condition of this wound. There is advancing epithelialization. Less aggressive debridement required 10/19/14 continued improvement in the condition and volume of this wound. Less aggressive debridement to the inferior part of this to remove surface slough and fibrinous eschar 11/02/14 no debridement is required. The surface granulation appears healthy although some of her islands of epithelialization seem to have regressed. No evidence of infection 11/16/14; lites surface debridement done of surface eschar. The wound does not look to be unhealthy. No evidence of infection. Unfortunately the patient has had podiatry issues in the right foot and for some reason has redeveloped small surface ulcerations in the medial right ankle. Her original presentation involved wounds in this area 11/23/14 no debridement. The area on the right ankle has enlarged. The left ankle wound appears stable in terms of the surface although there is periwound inflammation. There has been regression in the amount of new skin 11/30/14 no debridement. Both wound areas appear healthy. There was no evidence of infection. The the new area on the right medial ankle has enlarged although that both the surfaces appear to be stable. 12/07/14; Debridement of the right medial ankle wound. No no debridement was done on the left. 12/14/14 no major change in and now bilateral medial ankle wounds. Both of these are very painful but the no overt evidence of infection. She has had previous venous ablation 12/21/14; patient states that her right medial ankle wound is considerably more painful last week than usual. Her left is also somewhat painful. She could not tolerate debridement. The right medial ankle  wound has fibrinous  surface eschar 12/28/14 this is a patient with severe bilateral venous insufficiency ulcers. For a considerable period of time we actually had the one on the right medial ankle healed however this recently opened up again in June. The left medial ankle wound has been a refractory area with some absent flows. We had some success with Hydrofera Blue on this area and it literally closed by 50% however it is recently opened up Foley. Both of these were debridement today of surface eschar. She tolerates this poorly 01/25/15: No change in the status of this. Thick adherent escar. Very poor tolerance of any attempt at debridement. I had healed the right medial malleolus wound for a considerable amount of time and had the left one down to about 50% of the volume although this is totally regressed over the last 48 weeks. Further the right leg has reopened. she is trying to make a appointment with pain and vascular, previous ablations with Dr. Aleda Grana. I do not believe there is an arterial insufficiency issue here 02/01/15 the status of the adherent eschar bilaterally is actually improved. No debridement was done. She did not manage to get vascular studies done 02/08/15 continued debridement of the area was done today. The slough is less adherent and comes off with less pressure. There is no surrounding infection peripheral pulses are intact 02/15/15 selective debridement with a disposable curette. Again the slough is less adherent and comes off with less difficulty. No surrounding infection peripheral pulses are intact. 02/22/15 selective debridement of the right medial ankle wound. Slough comes off with less difficulty. No obvious surrounding infection peripheral pulses are intact I did not debridement the one on the left. Both of these are stable to improved 03/01/15 selective debridement of both wound areas using a curette to. Adherent slough cup soft with less difficulty. No obvious surrounding  infection. The patient tells me that 2 days ago she noted a rash above the right leg wrap. She did not have this on her lower legs when she change this over she arrives with widespread left greater than right almost folliculitis-looking rash which is extremely pruritic. I don't see anything to culture here. There is no rash on the rest of her body. She feels well systemically. 03/08/15; selective debridement of both wounds using a curette. Base of this does not look unhealthy. She had limegreen drainage coming out of the left leg wound and describes a lot of drainage. The rash on her left leg looks improved to. No cultures were done. 03/22/15; patient was not here last week. Basal wounds does not look healthy and there is no surrounding erythema. No drainage. There is still a rash on the left leg that almost looks vasculitic however it is clearly limited to the top of where the wrap would be. 04/05/15; on the right required a surgical debridement of surface eschar and necrotic subcutaneous tissue. I did not debridement the area on the left. These continue to be large open wounds that are not changing that much. We were successful at one point in healing the area on the right, and at the same time the area on the left was roughly half the size of current measurements. I think a lot of the deterioration has to do with the prolonged time the patient is on her feet at work 04/19/15 I attempted-like surface debridement bilaterally she does not tolerate this. She tells me that she was in allergic care yesterday with extreme pain over her left lateral  malleolus/ankle and was told that she has an "sprain" 05/03/15; large bilateral venous insufficiency wounds over the medial malleolus/medial aspect of her ankles. She complains of copious amounts of drainage and his usual large amounts of pain. There is some increasing erythema around the wound on the right extending into the medial aspect of her foot to.  historically she came in with these wounds the right one healed and the left one came down to roughly half its current size however the right one is reopened and the left is expanded. 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 05/10/15 large bilateral venous insufficiency wounds. There is less adherence surface left however the surface culture that I did last week grew pseudomonas therefore bilateral selective debridement score necessary. There is surrounding erythema. The patient describes severe bilateral drainage and a lot of pain in the left ankle. Apparently her podiatrist was were ready to do a cortisone shot 05/17/15; the patient complains of pain and again copious amounts of drainage. 05/24/15; we used Iodo flex last week. Patient notes considerable improvement in wound drainage. Only needed to change this once. 05/31/15; we continued Iodoflex; the base of these large wounds bilaterally is not too bad but there is probably likely a significant bioburden here. I would like to debridement just doesn't tolerate it. 06/06/14 I would like to continue the Iodoflex although she still hasn't managed to obtain supplies. She has bilateral medial malleoli or large wounds which are mostly superficial. Both of them are covered circumferentially with some nonviable fibrinous slough although she tolerates debridement very poorly. She apparently has an appointment for an ablation on the right leg by interventional radiology. 06/14/15; the patient arrives with the wounds and static condition. We attempted a debridement although she does not do well with this secondary to pain. I 07/05/15; wounds are not much smaller however there appears to be a cleaner granulating base. The left has tight fibrinous slough greater than the right. Debridement is tolerated poorly due to pain. Iodoflex is done more for these wounds in any of the multitude of different dressings I  have tried on the left 1 and then subsequently the right. 07/12/15; no change in the condition of this wound. I am able to do an aggressive debridement on the right but not the left. She simply cannot tolerate it. We have been using Iodoflex which helps somewhat. It is worthwhile remembering that at one point we healed the right medial ankle wound and the left was about 25% of the current circumference. We have suggested returning to vascular surgery for review of possible further ablations for one reason or another she has not been able to do this. 07/26/15 no major change in the condition of either wound on her medial ankle. I did not attempt to debridement of these. She has been aggressively scrubbing these while she is in the shower at home. She has her supply of Iodoflex which seems to have done more for these wounds then anything I have put on recently. 08/09/15 wound area appears larger although not verified by measurements. Using Iodoflex 09/05/2015 -- she was here for avisit today but had significant problems with the wound and I was asked to see her for a physician opinion. I have summarize that this lady has had surgery on her left lower extremity about 10 years ago where the possible veins stripping was done. She has had an opinion from interventional radiology  around November 2016 where no further sclerotherapy was ordered. The patient works 12 hours a day and stands on a concrete floor with work boots and is unable to get the proper compression she requires and cannot elevate her limbs appropriately at any given time. She has recently grown Pseudomonas from her wound culture but has not started her ciprofloxacin which was called in for her. 09/13/15 this continues to be a difficult situation for this patient. At one point I had this wound down to a 1.5 x 1.5" wound on her left leg. This is deteriorated and the right leg has reopened. She now has substantial wounds on her medial calcaneus,  malleoli and into her lower leg. One on the left has surface eschar but these are far too painful for me to debridement here. She has a vascular surgery appointment next week to see if anything can be done to help here. I think she has had previous ablations several years ago at [facility]. She has no major edema. She tells me that she did not get product last time [facility] and went for several days without it. She continues to work in work boots 12 hours a day. She cannot get compression/4-layer under her work boots. 09/20/15 no major change. Periwound edema control was not very good. Her point with pain and vascular is next Wednesday the 25th 09/28/15; the patient is seen vascular surgery and is apparently scheduled for repeat duplex ultrasounds of her bilateral lower legs next week. 10/05/15; the patient was seen by Dr. Doren Custard of vascular surgery. He feels that she should have arterial insufficiency excluded as cause/contributed to her nonhealing stage she is therefore booked for an arteriogram. She has apparently monophasic signals in the dorsalis pedis pulses. She also of course has known severe chronic venous insufficiency with previous procedures as noted previously. I had another long discussion with the patient today about her continuing to work 12 hour shifts. I've written her out for 2 months area had concerns about this as her work location is currently undergoing significant turmoil and this may lead to her termination. She is aware of this however I agree with her that she simply cannot continue to stand for 12 hours multiple days a week with the substantial wound areas she has. 10/19/15; the Dr. Doren Custard appointment was largely for an arteriogram which was normal. She does not have an arterial issue. He didn't make a comment about her chronic venous insufficiency for which she has had previous ablations. Presumably it was not felt that anything additional could be done. The patient is  now out of work as I prescribed 2 weeks ago. Her wounds look somewhat less aggravated presumably because of this. I felt I would give debridement another try today 10/25/15; no major change in this patient's wounds. We are struggling to get her product that she can afford into her own home through her insurance. 11/01/15; no major change in the patient's wounds. I have been using silver alginate as the most affordable product. I spoke to Dr. Marla Roe last week with her requested take her to the OR for surgical debridement and placement of ACEL. Dr. Marla Roe told me that she would be willing to do this however [facility] will not cover this, fortunately the patient has [place] healthcare of some variant 11/08/15; no major change in the patient's wounds. She has been completely nonviable surface that this but is in too much pain with any attempted debridement are clinic. I have  arranged for her to see Dr. Marla Roe ham of plastic surgery and this appointment is on Monday. I am hopeful that they will take her to the OR for debridement, possible ACEL ultimately possible skin graft 11/22/15 no major change in the patient's wounds over her bilateral medial calcaneus medial malleolus into the lower legs. Surface on these does not look too bad however on the left there is surrounding erythema and tenderness. This may be cellulitis or could him sleepy tinea. 11/29/15; no major changes in the patient's wounds over her bilateral medial malleolus. There is no infection here and I don't think any additional antibiotics are necessary. There is now plan to move forward. She sees Dr. Marla Roe in a week's time for preparation for operative debridement and ACEL placement I believe on 7/12. She then has a follow-up appointment with Dr. Marla Roe on 7/21 12/28/15; the patient returns today having been taken to the [place] by Dr. Marla Roe 12/12/15 she underwent debridement, intraoperative cultures [which were  negative]. She had placement of a wound VAC. Parent really ACEL was not available to be placed. The wound VAC foam apparently adhered to the wound since then she's been using silver alginate, Xeroform under Ace wraps. She still says there is a lot of drainage and a lot of pain 01/31/16; this is a patient I see monthly. I had referred her to Dr. Marla Roe him of plastic surgery for large wounds on her bilateral medial ankles. She has been to the OR twice once in early July and once in early August. She tells me over the last 3 weeks she has been using the wound VAC with ACEL underneath it. On the right we've simply been using silver alginate. Under Kerlix Coban wraps. 02/28/16; this is a patient I'm currently seeing monthly. She is gone on to have a skin graft over her large venous insufficiency ulcer on the left medial ankle. This was done by Dr. Marla Roe him. The patient is a bit perturbed about why she didn't have one on her right medial ankle wound. She has been using silver alginate to this. 03/06/16; I received a phone call from her plastic surgery Dr. Marla Roe. She expressed some concern about the viability of the skin graft she did on the left medial ankle wound. Asked me to place Endoform on this. She told me she is not planning to do a subsequent skin graft on the right as the left one did not take very well. I had placed Hydrofera Blue on the right 03/13/16; continue to have a reasonably healthy wound on the right medial ankle. Down to 3 mm in terms of size. There is epithelialization here. The area on the left medial ankle is her skin graft site. I suppose the last week this looks somewhat better. She has an open area inferiorly however in the center there appears to be some viable tissue. There is a lot of surface callus and eschar that will eventually need to come off however none of this looked to be infected. Patient states that the is able to keep the dressing on for several days  which is an improvement. 03/20/16 no major change in the circumference of either wound however on the left side the patient was at Dr. Eusebio Friendly office and they did a debridement of left wound. 50% of the wound seems to be epithelialized. I been using Endoform on the left Hydrofera Blue in the right 03/27/16; she arrives today with her wound is not looking as healthy as they did  last week. The area on the right clearly has an adherent surface to this a very similar surface on the left. Unfortunately for this patient this is all too familiar problem. Clearly the Endoform is not working and will need to change that today that has some potential to help this surface. She does not tolerate debridement in this clinic very well. She is changing the dressing wants 04/03/16; patient arrives with the wounds looking somewhat better especially on the right. Dr. Migdalia Dk change the dressing to silver alginate when she saw her on Monday and also sold her some compression socks. The usefulness of the latter is really not clear and woman with severely draining wounds. 04/10/16; the patient is doing a bit of an experiment wearing the compression stockings that Dr. Migdalia Dk provided her to her left leg and the out of legs based dressings that we provided to the right. 05/01/16; the patient is continuing to wear compression stockings Dr. Migdalia Dk provided her on the left that are apparently silver impregnated. She has been using Iodoflex to the right leg wound. Still a moderate amount of drainage, when she leaves here the wraps only last for 4 days. She has to change the stocking on the left leg every night 05/15/16; she is now using compression stockings bilaterally provided by Dr. Marla Roe. She is wearing a nonadherent layer over the wounds so really I don't think there is anything specific being done to this now. She has some reduction on the left wound. The right is stable. I think all healing here is being done  without a specific dressing 06/09/16; patient arrives here today with not much change in the wound certainly in diameter to large circular wounds over the medial aspect of her ankle bilaterally. Under the light of these services are certainly not viable for healing. There is no evidence of surrounding infection. She is wearing compression stockings with some sort of silver impregnation as prescribed by Dr. Marla Roe. She has a follow-up with her tomorrow. 06/30/16; no major change in the size or condition of her wounds. These are still probably covered with a nonviable surface. She is using only her purchase stockings. She did see Dr. Marla Roe who seemed to want to apply Dakin's solution to this I'm not extreme short what value this would be. I would suggest Iodoflex which she still has at home. 07/28/16; I follow Mrs. Guess episodically along with Dr. Marla Roe. 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 She has been applying a topical collagen ointment to both wounds with Adaptic. I don't think Dr. Marla Roe is planning to take her back to the OR. 08/19/16; I follow Mrs. Jeneen Rinks on a monthly basis along with Dr. Marla Roe of plastic surgery. 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 I been following her for a number of years. At one point I was able to get the right medial malleolus wound to heal and had the left medial malleolus down to about half its current size however and I had to send her to plastic surgery for an operative debridement. Since then things have been stable to slightly improve the area on the right is slightly better one in the left about the same although there is much less adherent surface than I'm used to with this patient. She is using some form of liquid collagen gel that Dr. Marla Roe provided a Kerlix cover with the patient's own  pressure stockings. She tells me that she has extreme pain in both ankles and along the lateral aspect of both feet. She has been unable to work for some period of time. She is telling me she is retiring at the beginning of April. She sees Dr. Doran Durand of orthopedics next week 09/22/16; patient has not seen Dr. Marla Roe since the last time she is here. I'm not really sure what she is using to the wounds other than bits and pieces of think she had left over including most recently Hydrofera Blue. She is using juxtalite stockings. She is having difficulty with her husband's recent illness "stroke". She is having to transport him to various doctors appointments. Dr. Marla Roe left her the option of a repeat debridement with ACEL however she has not been able to get the time to follow-up on this. She continues to have a fair amount of drainage out of these wounds with certainly precludes leaving dressings on all week 10/13/16; patient has not seen Dr. Marla Roe since she was last in our clinic. I'm not really sure what she is doing with the wounds, we did try to get her [facility] and I think she is actually using this most of the time. Because of drainage she states she has to change this every second day although this is an improvement from what she used to do. She went to see Dr. Doran Durand who did not think she had a muscular issue with regards to her feet, he referred her to a neurologist and I think the appointment is sometime in June. I changed her back to Iodoflex which she has used in the past but not recently. 11/03/16; the patient has been using Iodoflex although she ran out of this. Still claims that there is a lot of drainage although the wound does not look like this. No surrounding erythema. She has not been back to see Dr. Marla Roe 11/24/16; the patient has been using Iodoflex again but she ran out of it 2 or 3 days ago. There is no major change in the condition of either one of these  wounds in fact they are larger and covered in a thick adherent surface slough/nonviable tissue especially on the left. She does not tolerate mechanical debridement in our clinic. Going back to see Dr. Marla Roe of plastic surgery for an operative debridement would seem reasonable. 12/15/16; the patient has not been back to see Dr. Marla Roe. She is been dealing with a series of illnesses and her husband which of monopolized her time. She is been using Sorbact which we largely supplied. She states the drainage is bad enough that it maximum she can go 2-3 days without changing the dressing 01/12/2017 -- the patient has not been back for about 4 weeks and has not seen Dr. Marla Roe not does she have any appointment pending. 01/23/17; patient has not seen Dr. Marla Roe even though I suggested this previously. She is using Santyl that was suggested last week by Dr. Con Memos this Cost her $16 through her insurance which is indeed surprising 02/12/17; continuing Santyl and the patient is changing this daily. A lot of drainage. She has not been back to see plastic surgery she is using an Ace wrap. Our intake nurse suggested wrap around stockings which would make a good reasonable alternative 02/26/17; patient is been using Santyl and changing this daily due to drainage. She has not been to see plastic surgery she uses in April Ace wrap to control the edema. She did obtain extremitease stockings  but stated that the edema in her leg was to big for these 03/20/17; patient is using Santyl and Anasept. Surfaces looked better today the area on the right is actually measuring a little smaller. She has states she has a lot of pain in her feet and ankles and is asking for a consult to pain control which I'll try to help her with through our case manager. 04/10/17; the patient arrives with better-looking wound surfaces and is slightly smaller wound on the left she is using a combination of Santyl and Anasept. She has  an appointment or at least as started in the pain control center associated with [facility] 05/14/17; this is a patient who I followed for a prolonged period of time. 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 At one point I had this down to a much smaller wound on the left however these reopened and we've never been able to get these to heal. She has been using Santyl and Anasept gel although 2 weeks ago she ran out of the Anasept gel. She has a stable appearance of the wound. She is going to the wound care clinic at [facility]. They wanted do a nerve block/spinal block although she tells me she is reluctant to go forward with that. 05/21/17; this is a patient I have followed for many years. 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 Chronic pain and deformity in her ankles as well. She is been to see plastic surgery as well as orthopedics. Using PolyMem AG most recently/Kerramax/ABDs and 2 layer compression. She has managed to keep this on and she is coming in for a nurse check to change the dressing on Tuesdays, we see her on Fridays 06/05/17; really quite a good looking surface and the area especially on the right medial has contracted in terms of dimensions. Well granulated healthy-looking tissue on both sides. Even with an open curet there is nothing that even feels abnormal here. This is as good as I've seen this in quite some time. We have been using PolyMem AG and bringing her in for a nurse check 06/12/17; really quite good surface on both of these wounds. The right medial has contracted a bit left is not. We've been using PolyMem and AG and she is coming in for a nurse visit 06/19/17; we have been using PolyMem AG and bringing her in for a nurse check. Dimensions of her wounds are not better but the surfaces looked better bilaterally. She complained of bleeding last night and the left wound and increasing pain bilaterally. She  states her wound pain is more neuropathic than just the wounds. There was some suggestion that this was radicular from her pain management doctor in talking to her it is really difficult to sort this out. 06/26/17; using PolyMem and AG and bringing her in for a nurse check as All of this and reasonably stable condition. Certainly not improved. The dimensions on the lateral part of the right leg look better but not really measuring better. The medial aspect on the left is about the same. 07/03/16; we have been using PolyMen AG and bringing her in for a nurse check to change the dressings as the wounds have drainage which precludes once weekly changing. We are using all secondary absorptive dressings.our intake nurse is brought up the idea of using a wound VAC/snap VAC on the wound to help with the drainage to see if this would result in some contraction. This is  not a bad idea. The area on the right medial is actually looking smaller. Both wounds have a reasonable-looking surface. There is no evidence of cellulitis. The edema is well controlled 07/10/17; the patient was denied for a snap VAC by her insurance. The major issue with these wounds continues to be drainage. We are using wicked PolyMem AG and she is coming in for a nurse visit to change this. The wounds are stable to slightly improved. The surface looks vibrant and the area on the right certainly has shrunk in size but very slowly 07/17/17; the patient still has large wounds on her bilateral medial malleoli. Surface of both of these wounds looks better. The dimensions seem to come and go but no consistent improvement. There is no epithelialization. We do not have options for advanced treatment products due to insurance issues. They did not approve of the wound VAC to help control the drainage. More recently we've been using PolyMem and AG wicked to allow drainage through. We have been bringing her in for a nurse visit to change this. We do not  have a lot of options for wound care products and the home again due to insurance issues 07/24/17; the patient's wound actually looks somewhat better today. No drainage measurements are smaller still healthy-looking surface. We used silver collagen under PolyMen started last week. We have been bringing her in for a dressing change 07/31/17; patient's wound surface continued to look better and I think there is visible change in the dimensions of the wound on the right. Rims of epithelialization. We have been using silver collagen under PolyMen and bringing her in for a dressing change. There appears to be less drainage although she is still in need of the dressing change 08/07/17. Patient's wound surface continues to look better on both sides and the area on the right is definitely smaller. We have been using silver collagen and PolyMen. She feels that the drainage has been it has been better. I asked her about her vascular status. She went to see Dr. Aleda Grana at [facility] and had some form of ablation. I don't have much detail on this. I haven't my notes from 2016 that she was not a candidate for any further ablation but I don't have any more information on this. We had referred her to vein and vascular I don't think she ever went. He does not have a history of PAD although I don't have any information on this either. We don't even have ABIs in our record 08/14/17; we've been using silver collagen and PolyMen cover. And putting the patient and compression. She we are bringing her in as a nurse visit to change this because ofarge amount of drainage. We didn't the ABIs in clinic today since they had been done in many moons 1.2 bilaterally. She has been to see vein and vascular however this was at [facility] and she had ablation although I really don't have any information on this all seemed biking get a report. She is also been operatively debrided by plastic surgery and had a cell placed  probably 8-12 months ago. This didn't have a major effect. We've been making some gains with current dressings 08/19/17-She is here in follow-up evaluation for bilateral medial malleoli ulcers. She continues to tolerate debridement very poorly. We will continue with recently changed topical treatment; if no significant improvement may consider switching to Iodosorb/Iodoflex. 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 She has been using silver  collagen and PolyMem. I believe she has been used and tried on Iodoflex before. 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 These have been very refractory over time. We have been using silver collagen and PolyMen. She is coming in once a week for a doctor's and once a week for nurse visits. We are actually making some progress 09/18/17; the patient's wounds are smaller especially on the right medial. She arrives today to upset to consider even washing these off with Anasept which I think is been part of the reason this is been closing. We've been using collagen covered in PolyMen otherwise. It is noted that she has a small area of folliculitis on the right medial calf that. As we are wrapping her legs I'll give her a short course of doxycycline to make sure this doesn't amount to anything. She is a long list of complaints today including imbalance, shortness of breath on exertion, inversion of her left ankle. With regards to the latter complaints she is been to see orthopedics and they offered her a tendon release surgery I believe but wanted her wounds to be closed first. I have recommended she go see her primary physician with regards to everything else. 09/25/17; patient's wounds are about the same size. We have made  some progress bilaterally although not in recent weeks. She will not allow me T wash these wounds with Anasept even if she is doing her cell. Wheeze we've been using collagen covered in PolyMen. Last week she had a small area of folliculitis this is now opened into a small wound. She completed 5 days of trimethoprim sulfamethoxazole 10/02/17; unfortunately the area on her left medial ankle is worse with a larger wound area towards the Achilles. The patient complains of a lot of pain. She will not allow debridement although visually I don't think there is anything to debridement in any case. We have been using silver collagen and PolyMen for several months now. Initially we are making some progress although I'm not really seeing that today. We will move back to [facility]. His admittedly this is a bit of a repeat however I'm hoping that his situation is different now. The patient tells me she had her leg on the left give out on her yesterday this is process some pain. 10/09/17; the patient is seen twice a week largely because of drainage issues coming out of the chronic medial bimalleolar wounds that are chronic. Last week the dimensions of the one on the left looks a little larger I changed her to [facility]. She comes in today with a history of terrible pain in the bilateral wound areas. She will not allow debridement. She will not even allow a tissue culture. There is no surrounding erythema no no evidence of cellulitis. We have been putting her Kerlix Coban man. She will not allow more aggressive compression as there was a suggestion to put her in 3 layer wraps. 10/16/17; large wounds on her bilateral medial malleoli. These are chronic. Not much change from last week. The surface looks have healthy but absolutely no epithelialization. A lot of pain little less so of drainage. She will not allow debridement or even washing these off in the vigorous fashion with Anasept. 10/23/17; large wounds  on her bilateral malleoli which are chronic. Some improvement in terms  of size perhaps on the right since last time I saw these. She states that after we increased the 3 layer compression there was some bleeding, when she came in for a nurse visit she did not want 3 layer compression put back on about our nurse managed to convince her. She has known chronic venous visit issues and I'm hoping to get her to tolerate the 3 layer compression. using Hydrofera Blue 10/30/17; absolutely no change in the condition of either wound although we've had some improvement in dimensions on the right.. Attempted to put her in 3 layer compression she didn't tolerated she is back in 2 layer compression. We've been using Hydrofera Blue We looked over her past records. She had venous reflux studies in November 2016. There was no evidence of deep venous reflux on the right. Superficial vein did not show the greater saphenous vein at think this is been previously ablated the small saphenous vein was within normal limits. The left deep venous system showed no DVT the vessels were positive for deep venous reflux in the posterior tibial veins at the ankle. The greater saphenous vein was surgically absent small saphenous vein was within normal limits. She went to vein and vascular at [facility]. I believe she had an ablation on the left greater saphenous vein. I'll update her reflux studies perhaps ever reviewed by vein and vascular. We've made absolutely no progress in these wounds. Will also try to read and TheraSkins through her insurance 11/06/17; W the patient apparently has a 2 week follow-up with vein and vascular I like him to review the whole issue with regards to her previous vascular workup by Dr. Aleda Grana. We've really made no progress on these wounds in many months. She arrives today with less viable looking surface on the left medial ankle wound. This was apparently looking about the same on Tuesday when  she was here for nurse visit. 11/13/17; deep tissue culture I did last time of the left lower leg showed multiple organisms without any predominating. In particular no Staphylococcus or group A strep were isolated. We sent her for venous reflux studies. She's had a previous left greater saphenous vein stripping and I think sclerotherapy of the right greater saphenous vein. She didn't really look at the lesser saphenous vein this both wounds are on the medial aspect. She has reflux in the common femoral vein and popliteal vein and an accessory vein on the right and the common femoral vein and popliteal vein on the left. I'm going to have her go to see vein and vascular just the look over things and see if anything else beside aggressive compression is indicated here. We have not been able to make any progress on these wounds in spite of the fact that the surface of the wounds is never look too bad. 11/20/17; no major change in the condition of the wounds. Patient reports a large amount of drainage. She has a lot of complaints of pain although enlisting her today I wonder if some of this at least his neuropathic rather than secondary to her wounds. She has an appointment with vein and vascular on 12/30/17. The refractory nature of these wounds in my mind at least need vein and vascular to look over the wounds the recent reflux studies we did and her history to see if anything further can be done here. I also note her gait is deteriorated quite a bit. 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 She has  a bilateral Trendelenburg gait. I wonder if this is neuropathic or perhaps multilevel radicular. 11/27/17; her wounds actually looks slightly better. Healthy-looking granulation tissue a scant amount of epithelialization. [place] healthcare will not pay for [employer]. They will play for tri layer Oasis and Dermagraft. This is not a diabetic ulcer. We'll try for the tri layer Oasis. She still  complains of some drainage. She has a vein and vascular appointment on 12/30/17 12/04/17; the wounds visually look quite good. Healthy-looking granulation with some degree of epithelialization. We are still waiting for response to our request for trial to try layer Oasis. Her appointment with vascular to review venous and arterial issues isn't sold the end of July 7/31. Not allow debridement or even vigorous cleansing of the wound surface. 12/18/17; slightly smaller especially on the right. 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 We've been using Hydrofera Blue. We still are looking into triple layer Oasis through her insurance 01/08/18 on evaluation today patient's wound actually appears to be showing signs of good improvement at this point in time. She has been tolerating the dressing changes without complication. Fortunately there does not appear to be any evidence of infection at this point in time. We have been utilizing silver nitrate which does seem to be of benefit for her which is also good news. Overall I'm very happy with how things seem to be both regards appearance as well as measurement. Patient did see Dr. Bridgett Larsson for evaluation on 12/30/17. In his assessment he felt that stripping would not likely add much more than chronic compression to the patient's healing process. His recommendation was to follow-up in three months with Dr. Doren Custard if she hasn't healed in order to consider referral back to you and see vascular where she previously was in a trial and was able to get her wound to heal. I'll be see what she feels she when you staying compression and he reiterated this as well. 01/13/18 on evaluation today patient appears to actually be doing very well in regard to her bilateral medial malleolus ulcers. She seems to have tolerated the chemical cauterization with silver nitrate last week she did have some pain through that evening but fortunately states that I'll  be see since it seems to be doing better she is overall pleased with the progress. 01/21/18; really quite a remarkable improvement since I've last seen these wounds. We started using silver nitrate specially on the islands of hyper granulation which for some reason her around the wound circumference. This is really done quite nicely. Primary dressing Hydrofera Blue under 4 layer compression. She seems to be able to hold out without a nurse rewrap. Follow-up in 1 week 01/28/18; we've continued the hydrofera blue but continued with chemical cauterization to the wound area that we started about a month ago for irregular hyper granulation. She is made almost stunning improvement in the overall wound dimensions. I was not really expecting this degree of improvement in these chronic wounds 02/05/18; we continue with Hydrofera Bluebut of also continued the aggressive chemical cauterization with silver nitrate. We made nice progress with the right greater than left wound. 02/12/18. We continued with Hydrofera Blue after aggressive chemical cauterization with silver nitrate. We appear to be making nice progress with both wound areas 02/19/2018; we continue with [facility] after washing the wounds vigorously with Anasept spray and chemical cauterization with silver nitrate. We are making excellent progress. The area on the right's just about closed 02/26/2018. The area on the left medial  ankle had too much necrotic debris today. I used a #5 curette we are able to get most of the soft. I continued with the silver nitrate to the much smaller wound on the right medial ankle she had a new area on her right lower pretibial area which she says was due to a role in her compression 03/05/2018; both wound areas look healthy. Not much change in dimensions from last week. I continue to use silver nitrate and Hydrofera Blue. The patient saw Dr. Doren Custard of vein and vascular. He felt she had venous stasis ulcers. He felt  based on her previous arteriogram she should have adequate circulation for healing. Also she has deep venous reflux but really no significant correctable superficial venous reflux at this time. He felt we should continue with conservative management including leg elevation and compression 04/02/2018; since we last saw this woman about a month ago she had a fall apparently suffered a pelvic fracture. I did not look up the x-ray. Nevertheless because of pain she literally was bedbound for 2 weeks and had home health coming out to change the dressing. Somewhat predictably this is resulted in considerable improvement in both wound areas. The right is just about closed on the medial malleolus and the left is about half the size. 04/16/2018; both her wounds continue to go down in size. Using Hydrofera Blue. 05/07/18; both her wounds appeared to be improving especially on the right where it is almost closed. We are using Hydrofera Blue 05/14/2018; slightly worse this week with larger wounds. Surface on the left medial not quite as good. We have been using Hydrofera Blue 05/21/18; again the wounds are slightly larger. Left medial malleolus slightly larger with eschar around the circumference. We have been using Hydrofera Blue undergoing a wraps for a prolonged period of time. This got a lot better when she was more recumbent due to a fall and a back injury. I change the primary dressing the silver alginate today. She did not tolerate a 4 layer compression previously although I may need to bring this up with her next time 05/28/2018; area on the left medial malleolus again is slightly larger with more drainage. Area on the right is roughly unchanged. She has a small area of folliculitis on the right medial just on the lower calf. This does not look ominous. 06/03/2018 left medial malleolus slightly smaller in a better looking surface. We used silver nitrate on this last time with silver alginate. The area  on the right appears slightly smaller 1/10; left medial malleolus slightly smaller. Small open area on the right. We used silver nitrate and silver alginate as of 2 weeks ago. We continue with the wound and compression. These got a lot better when she was off her feet 1/17; right medial malleolus wound is smaller. The left may be slightly smaller. Both surfaces look somewhat better. 1/24; both wounds are slightly smaller. Using silver alginate under Unna boots 1/31; both wounds appear smaller in fact the area on the right medial is just about closed. Surface eschar. We have been using silver alginate under Unna boots. The patient is less active now spends let much less time on her feet and I think this is contributed to the general improvement in the wound condition 2/7; both wounds appear smaller. I was hopeful the right medial would be closed however there there is still the same small open area. Slight amount of surface eschar on the left the dimensions are smaller there is eschar but  the wound edges appear to be free. We have been using silver alginate under Unna boot's 2/14; both wounds once again measure smaller. Circumferential eschar on the left medial. We have been using silver alginate under Unna boots with gradual improvement 2/21; the area on the right medial malleolus has healed. The area on the left is smaller. We have been using silver alginate and Unna boots. We can discharge wrapping the right leg she has 20/30 stockings at home she will need to protect the scar tissue in this area 2/28; the area on the right medial malleolus remains closed the patient has a compression stocking. The area on the left is smaller. We have been using silver alginate and Unna boots. 3/6 the area on the right medial ankle remains closed. Good edema control noted she is using her own compression stocking. The area on the left medial ankle is smaller. We have been managing this with silver alginate and  Unna boots which we will continue today. 3/13; the area on the right medial ankle remains closed and I'm declaring it healed today. When necessary the left is about the same still a healthy-looking surface but no major change and wound area. No evidence of infection and using silver alginate under unna and generally making considerable improvement 3/27 the area on the right medial ankle remains closed the area on the left is about the same as last week. Certainly not any worse we have been using silver alginate under an Unna boot 4/3; the area on the right medial ankle remains closed per the patient. We did not look at this wound. The wound on the left medial ankle is about the same surface looks healthy we have been using silver alginate under an Unna boot 4/10; area on the right medial ankle remains closed per the patient. We did not look at this wound. The wound on the left medial ankle is slightly larger. The patient complains that the [facility] caused burning pain all week. She also told us that she was a lot more active this week. Changed her back to silver alginate 4/17; right medial ankle still closed per the patient. Left medial ankle is slightly larger. Using silver alginate. She did not tolerate Hydrofera Blue on this area 4/24; right medial ankle remains closed we have not look at this. The left medial ankle continues to get larger today by about a centimeter. We have been using silver alginate under Unna boots. She complains about 4 layer compression as an alternative. She has been up on her feet working on her garden 5/8; right medial ankle remains closed we did not look at this. The left medial ankle has increased in size about 100%. We have been using silver alginate under Unna boots. She noted increased pain this week and was not surprised that the wound is deteriorated 5/15; no major change in SA however much less erythema ( one week of doxy ocellulitis). 5/22-63 year old  female returns at 1 week to the clinic for left medial ankle wound for which we have been using silver alginate under 3 layer compression She was placed on DOXY at last visit - the wound is wider at this visit. She is in 3 layer compression 5/29; change to [facility] last week. I had given her empiric doxycycline 2 weeks ago for a week. She is in 3 layer compression. She complains of a lot of pain and drainage on presentation today. 6/5; using Hydrofera Blue. I gave her doxycycline recently empirically for  erythema and pain around the wound. Believe her cultures showed enterococcus which not would not have been well covered by doxycycline nevertheless the wound looks better and I don't feel specifically that the enterococcus needs to be covered. She has a new what looks like a wrap injury on her lateral left ankle. 6/12; she is using Hydrofera Blue. She has a new area on the left anterior lower tibial area. This was a wrap injury last week. 6/19; the patient is using Hydrofera Blue. She arrived with marked inflammation and erythema around the wound and tenderness. 12/01/18 on evaluation today patient appears to be doing a little bit better based on what I'm hearing from the standpoint of lassos evaluation to this as far as the overall appearance of the wound is concerned. Then sometime substandard she typically sees Dr. Dellia Nims. Nonetheless overall very pleased with the progress that she's made up to this point. No fevers, chills, nausea, or vomiting noted at this time. 7/10; some improvement in the surface area. Aggressively debrided last week apparently. I went ahead with the debridement today although the patient does not tolerate this very well. We have been using Iodoflex. Still a fair amount of drainage 7/17; slightly smaller. Using Iodoflex. 7/24; no change from last week in terms of surface area. We have been using Iodoflex. Surface looks and continues to look somewhat better 7/31;  surface area slightly smaller better looking surface. We have been using Iodoflex. This is under Unna boot compression 8/7-Patient presents at 1 week with Unna boot and Iodoflex, wound appears better 8/14-Patient presents at 1 week with Iodoflex, we use the Unna boot, wound appears to be stable better.Patient is getting Botox treatment for the inversion of the foot for tendon release, Next week 8/21; we are using Iodoflex. Unna boot. The wound is stable in terms of surface area. Under illumination there is some areas of the wound that appear to be either epithelialized or perhaps this is adherent slough at this point I was not really clear. It did not wipe off and I was reluctant to debride this today. 8/28; we are using Iodoflex in an Unna boot. Seems to be making good improvement. 9/4; using Iodoflex and wound is slightly smaller. 9/18; we are using Iodoflex with topical silver nitrate when she is here. The wound continues to be smaller 10/2; patient missed her appointment last week due to GI issues. She left and Iodoflex based dressing on for 2 weeks. Wound is about the same size about the size of a dime on the left medial lower 10/9 we have been using Iodoflex on the medial left ankle wound. She has a new superficial probable wrap injury on the dorsal left ankle 10/16; we have been using Hydrofera Blue since last week. This is on the left medial ankle 10/23; we have been using Hydrofera Blue since 2 weeks ago. This is on the left medial ankle. Dimensions are better 11/6; using Hydrofera Blue. I think the wound is smaller but still not closed. Left medial ankle Electronic Signature(s) Signed: 04/10/2019 8:54:13 AM By: Linton Ham MD Entered By: Linton Ham on 04/08/2019 12:39:09 -------------------------------------------------------------------------------- Chemical Cauterization Details Patient Name: Date of Service: [name] 04/08/2019 10:00 AM Medical Record  [id number] Patient Account Number: [id number] Date of Birth/Sex: Treating RN: 15-Oct-1955 (63 y.o. [name] Provider: [name] Other Clinician: Referring Provider: Treating Provider/Extender:, Tawny Asal in Treatment: [phone] Procedure Performed for: Wound #3 Left,Medial Malleolus Performed By: Physician [name].,  MD Post Procedure Diagnosis Same as Pre-procedure Notes using silver nitrate sticks Electronic Signature(s) Signed: 04/10/2019 8:54:13 AM By: Linton Ham MD Entered By: Linton Ham on 04/08/2019 12:38:33 -------------------------------------------------------------------------------- Physical Exam Details Patient Name: Date of Service: [name] 04/08/2019 10:00 AM Medical Record [id number] Patient Account Number: [id number] Date of Birth/Sex: Treating RN: [birth year] (63 y.o. [name] Provider: [name] Other Clinician: Referring Provider: Treating Provider/Extender:, Tawny Asal in Treatment: 330 Constitutional Sitting or standing Blood Pressure is within target range for patient.. Pulse regular and within target range for patient.Marland Kitchen Respirations regular, non-labored and within target range.. Temperature is normal and within the target range for the patient.Marland Kitchen Appears in no distress. Cardiovascular Pedal pulses palpable. Notes Wound exam; left medial malleolus. Perhaps some improvement in surface area. Under illumination this appears to be reasonably healthy. 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 Still using Hydrofera Blue. No evidence of surrounding infection Electronic Signature(s) Signed: 04/10/2019 8:54:13 AM By: Linton Ham MD Entered By: Linton Ham on 04/08/2019 12:40:28 -------------------------------------------------------------------------------- Physician  Orders Details Patient Name: Date of Service: [name] 04/08/2019 10:00 AM Medical Record [id number] Patient Account Number: [id number] Date of Birth/Sex: Treating RN: 1956/01/29 (63 y.o. 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 Dressing Change Frequency Wound #3 Left,Medial Malleolus Do not change entire dressing for one week. Skin Barriers/Peri-Wound Care Moisturizing lotion TCA Cream or Ointment - mixed with lotion Wound Cleansing Clean wound with Wound Cleanser - clean with anasept with dressing changes May shower with protection. Primary Wound Dressing Wound #3 Left,Medial Malleolus Hydrofera Blue Secondary Dressing Wound #3 Left,Medial Malleolus Dry Gauze Kerramax Edema Control Unna Boot to Left Lower Extremity - no kerlix layer Avoid standing for long periods of time Elevate legs to the level of the heart or above for 30 minutes daily and/or when sitting, a frequency of: - throughout the day Support Garment 20-30 mm/Hg pressure to: - compression stocking right leg daily Electronic Signature(s) Signed: 04/08/2019 6:04:56 PM By: Kela Millin Signed: 04/10/2019 8:54:13 AM By: Linton Ham MD Entered By: Kela Millin on 04/08/2019 10:45:00 -------------------------------------------------------------------------------- Problem List Details Patient Name: Date of Service: [name] 04/08/2019 10:00 AM Medical Record [id number] Patient  Account Number: [id number] Date of Birth/Sex: Treating RN: [birth year] (63 y.o. 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 11/16/2014 No Yes of calf and inflammation L97.321 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 03/11/2019 No Yes breakdown of skin Inactive Problems ICD-10 Code Description Active Date Inactive Date I83.223 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 08/03/2014 08/03/2014 and inflammation L03.116 Cellulitis of left lower limb 09/07/2014 09/07/2014 Resolved Problems ICD-10 Code Description Active Date Resolved Date L97.312 Non-pressure chronic ulcer of right ankle with fat  layer 04/10/2016 04/10/2016 exposed Electronic Signature(s) Signed: 04/10/2019 8:54:13 AM By: Linton Ham MD Entered By: Linton Ham on 04/08/2019 12:38:14 -------------------------------------------------------------------------------- Progress Note Details Patient Name: Date of Service: [name] 04/08/2019 10:00 AM Medical Record [id number] Patient Account Number: [id number] Date of Birth/Sex: Treating RN: [birth year] (63 y.o. [name] Provider: [name] Other Clinician: Referring Provider: Treating Provider/Extender:, Tawny Asal in Treatment: 330 Subjective History of Present Illness (HPI) the remaining wound is over the left medial ankle. Similar wound over the right medial ankle healed largely with use of Apligraf. Most recently we have been using Hydrofera Blue over this wound with considerable improvement. The patient has  been extensively worked up in the past for her venous insufficiency and she is not a candidate for antireflux surgery although I have none of the details available currently. 08/24/14; considerable improvement today. About 50% of this wound areas now epithelialized. The base of the wound appears to be healthier granulation.as opposed to last week when she had deteriorated a considerable improvement 08/17/14; unfortunately the wound has regressed somewhat. The areas of epithelialization from the superior aspect are not nearly as healthy as they were last week. The patient thinks her Hydrofera Blue slipped. 09/07/14; unfortunately the area has markedly regressed in the 2 weeks since I've seen this. There is an odor surrounding erythema. The healthy granulation tissue that we had at the base of the wound now is a dusky color. The nurse reports green drainage 09/14/14; the area looks somewhat better than last week. There is less erythema and less drainage. The culture I did did not show any growth. Nevertheless I think it is better to continue the Cipro and doxycycline for a further week. The remaining wound area was debridement. 09/21/14. Wound did not require debridement last week. Still less erythema and less drainage. She can complete her antibiotics. The areas of epithelialization in the superior aspect of the wound do not look as healthy as they did some weeks ago 10/05/14 continued improvement in the condition of this wound. There is advancing epithelialization. Less aggressive debridement required 10/19/14 continued improvement in the condition and volume of this wound. Less aggressive debridement to the inferior part of this to remove surface slough and fibrinous eschar 11/02/14 no debridement is required. The surface granulation appears healthy although some of her islands of epithelialization seem to have regressed. No evidence of infection 11/16/14; lites surface debridement done of surface eschar.  The wound does not look to be unhealthy. No evidence of infection. Unfortunately the patient has had podiatry issues in the right foot and for some reason has redeveloped small surface ulcerations in the medial right ankle. Her original presentation involved wounds in this area 11/23/14 no debridement. The area on the right ankle has enlarged. The left ankle wound appears stable in terms of the surface although there is periwound inflammation. There has been regression in the amount of new skin 11/30/14 no debridement. Both wound areas appear healthy. There was no evidence of infection. The the new area on the right medial ankle has enlarged although that both the surfaces appear to be stable. 12/07/14; Debridement of the right medial ankle wound. No no debridement was done on the left. 12/14/14 no major change in and now bilateral medial ankle wounds. Both of these are very painful but the no overt evidence of infection. She has had previous venous ablation 12/21/14; patient states that her right medial ankle wound is considerably more painful last week than  usual. Her left is also somewhat painful. She could not tolerate debridement. The right medial ankle wound has fibrinous surface eschar 12/28/14 this is a patient with severe bilateral venous insufficiency ulcers. For a considerable period of time we actually had the one on the right medial ankle healed however this recently opened up again in June. The left medial ankle wound has been a refractory area with some absent flows. We had some success with Hydrofera Blue on this area and it literally closed by 50% however it is recently opened up Foley. Both of these were debridement today of surface eschar. She tolerates this poorly 01/25/15: No change in the status of this. Thick adherent escar. Very poor tolerance of any attempt at debridement. I had healed the right medial malleolus wound for a considerable amount of time and had the left one down to  about 50% of the volume although this is totally regressed over the last 48 weeks. Further the right leg has reopened. she is trying to make a appointment with pain and vascular, previous ablations with Dr. Aleda Grana. I do not believe there is an arterial insufficiency issue here 02/01/15 the status of the adherent eschar bilaterally is actually improved. No debridement was done. She did not manage to get vascular studies done 02/08/15 continued debridement of the area was done today. The slough is less adherent and comes off with less pressure. There is no surrounding infection peripheral pulses are intact 02/15/15 selective debridement with a disposable curette. Again the slough is less adherent and comes off with less difficulty. No surrounding infection peripheral pulses are intact. 02/22/15 selective debridement of the right medial ankle wound. Slough comes off with less difficulty. No obvious surrounding infection peripheral pulses are intact I did not debridement the one on the left. Both of these are stable to improved 03/01/15 selective debridement of both wound areas using a curette to. Adherent slough cup soft with less difficulty. No obvious surrounding infection. The patient tells me that 2 days ago she noted a rash above the right leg wrap. She did not have this on her lower legs when she change this over she arrives with widespread left greater than right almost folliculitis-looking rash which is extremely pruritic. I don't see anything to culture here. There is no rash on the rest of her body. She feels well systemically. 03/08/15; selective debridement of both wounds using a curette. Base of this does not look unhealthy. She had limegreen drainage coming out of the left leg wound and describes a lot of drainage. The rash on her left leg looks improved to. No cultures were done. 03/22/15; patient was not here last week. Basal wounds does not look healthy and there is no  surrounding erythema. No drainage. There is still a rash on the left leg that almost looks vasculitic however it is clearly limited to the top of where the wrap would be. 04/05/15; on the right required a surgical debridement of surface eschar and necrotic subcutaneous tissue. I did not debridement the area on the left. These continue to be large open wounds that are not changing that much. We were successful at one point in healing the area on the right, and at the same time the area on the left was roughly half the size of current measurements. I think a lot of the deterioration has to do with the prolonged time the patient is on her feet at work 04/19/15 I attempted-like surface debridement bilaterally she does not tolerate this. She  tells me that she was in allergic care yesterday with extreme pain over her left lateral malleolus/ankle and was told that she has an "sprain" 05/03/15; large bilateral venous insufficiency wounds over the medial malleolus/medial aspect of her ankles. She complains of copious amounts of drainage and his usual large amounts of pain. There is some increasing erythema around the wound on the right extending into the medial aspect of her foot to. historically she came in with these wounds the right one healed and the left one came down to roughly half its current size however the right one is reopened and the left is expanded. 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 05/10/15 large bilateral venous insufficiency wounds. There is less adherence surface left however the surface culture that I did last week grew pseudomonas therefore bilateral selective debridement score necessary. There is surrounding erythema. The patient describes severe bilateral drainage and a lot of pain in the left ankle. Apparently her podiatrist was were ready to do a cortisone shot 05/17/15; the patient complains of pain and again copious  amounts of drainage. 05/24/15; we used Iodo flex last week. Patient notes considerable improvement in wound drainage. Only needed to change this once. 05/31/15; we continued Iodoflex; the base of these large wounds bilaterally is not too bad but there is probably likely a significant bioburden here. I would like to debridement just doesn't tolerate it. 06/06/14 I would like to continue the Iodoflex although she still hasn't managed to obtain supplies. She has bilateral medial malleoli or large wounds which are mostly superficial. Both of them are covered circumferentially with some nonviable fibrinous slough although she tolerates debridement very poorly. She apparently has an appointment for an ablation on the right leg by interventional radiology. 06/14/15; the patient arrives with the wounds and static condition. We attempted a debridement although she does not do well with this secondary to pain. I 07/05/15; wounds are not much smaller however there appears to be a cleaner granulating base. The left has tight fibrinous slough greater than the right. Debridement is tolerated poorly due to pain. Iodoflex is done more for these wounds in any of the multitude of different dressings I have tried on the left 1 and then subsequently the right. 07/12/15; no change in the condition of this wound. I am able to do an aggressive debridement on the right but not the left. She simply cannot tolerate it. We have been using Iodoflex which helps somewhat. It is worthwhile remembering that at one point we healed the right medial ankle wound and the left was about 25% of the current circumference. We have suggested returning to vascular surgery for review of possible further ablations for one reason or another she has not been able to do this. 07/26/15 no major change in the condition of either wound on her medial ankle. I did not attempt to debridement of these. She has been aggressively scrubbing these while she is  in the shower at home. She has her supply of Iodoflex which seems to have done more for these wounds then anything I have put on recently. 08/09/15 wound area appears larger although not verified by measurements. Using Iodoflex 09/05/2015 -- she was here for avisit today but had significant problems with the wound and I was asked to see her for a physician opinion. I have summarize that this lady has had surgery on her left lower extremity about 10 years  ago where the possible veins stripping was done. She has had an opinion from interventional radiology around November 2016 where no further sclerotherapy was ordered. The patient works 12 hours a day and stands on a concrete floor with work boots and is unable to get the proper compression she requires and cannot elevate her limbs appropriately at any given time. She has recently grown Pseudomonas from her wound culture but has not started her ciprofloxacin which was called in for her. 09/13/15 this continues to be a difficult situation for this patient. At one point I had this wound down to a 1.5 x 1.5" wound on her left leg. This is deteriorated and the right leg has reopened. She now has substantial wounds on her medial calcaneus, malleoli and into her lower leg. One on the left has surface eschar but these are far too painful for me to debridement here. She has a vascular surgery appointment next week to see if anything can be done to help here. I think she has had previous ablations several years ago at [facility]. She has no major edema. She tells me that she did not get product last time [facility]] and went for several days without it. She continues to work in work boots 12 hours a day. She cannot get compression/4-layer under her work boots. 09/20/15 no major change. Periwound edema control was not very good. Her point with pain and vascular is next Wednesday the 25th 09/28/15; the patient is seen vascular surgery and is apparently  scheduled for repeat duplex ultrasounds of her bilateral lower legs next week. 10/05/15; the patient was seen by Dr. Doren Custard of vascular surgery. He feels that she should have arterial insufficiency excluded as cause/contributed to her nonhealing stage she is therefore booked for an arteriogram. She has apparently monophasic signals in the dorsalis pedis pulses. She also of course has known severe chronic venous insufficiency with previous procedures as noted previously. I had another long discussion with the patient today about her continuing to work 12 hour shifts. I've written her out for 2 months area had concerns about this as her work location is currently undergoing significant turmoil and this may lead to her termination. She is aware of this however I agree with her that she simply cannot continue to stand for 12 hours multiple days a week with the substantial wound areas she has. 10/19/15; the Dr. Doren Custard appointment was largely for an arteriogram which was normal. She does not have an arterial issue. He didn't make a comment about her chronic venous insufficiency for which she has had previous ablations. Presumably it was not felt that anything additional could be done. The patient is now out of work as I prescribed 2 weeks ago. Her wounds look somewhat less aggravated presumably because of this. I felt I would give debridement another try today 10/25/15; no major change in this patient's wounds. We are struggling to get her product that she can afford into her own home through her insurance. 11/01/15; no major change in the patient's wounds. I have been using silver alginate as the most affordable product. I spoke to Dr. Marla Roe last week with her requested take her to the OR for surgical debridement and placement of ACEL. Dr. Marla Roe told me that she would be willing to do this however [facility] will not cover this, fortunately the patient has [place] healthcare of some  variant 11/08/15; no major change in the patient's wounds. She has been completely nonviable surface  that this but is in too much pain with any attempted debridement are clinic. I have arranged for her to see Dr. Marla Roe ham of plastic surgery and this appointment is on Monday. I am hopeful that they will take her to the OR for debridement, possible ACEL ultimately possible skin graft 11/22/15 no major change in the patient's wounds over her bilateral medial calcaneus medial malleolus into the lower legs. Surface on these does not look too bad however on the left there is surrounding erythema and tenderness. This may be cellulitis or could him sleepy tinea. 11/29/15; no major changes in the patient's wounds over her bilateral medial malleolus. There is no infection here and I don't think any additional antibiotics are necessary. There is now plan to move forward. She sees Dr. Marla Roe in a week's time for preparation for operative debridement and ACEL placement I believe on 7/12. She then has a follow-up appointment with Dr. Marla Roe on 7/21 12/28/15; the patient returns today having been taken to the [facility] by Dr. Marla Roe 12/12/15 she underwent debridement, intraoperative cultures [which were negative]. She had placement of a wound VAC. Parent really ACEL was not available to be placed. The wound VAC foam apparently adhered to the wound since then she's been using silver alginate, Xeroform under Ace wraps. She still says there is a lot of drainage and a lot of pain 01/31/16; this is a patient I see monthly. I had referred her to Dr. Marla Roe him of plastic surgery for large wounds on her bilateral medial ankles. She has been to the OR twice once in early July and once in early August. She tells me over the last 3 weeks she has been using the wound VAC with ACEL underneath it. On the right we've simply been using silver alginate. Under Kerlix Coban wraps. 02/28/16; this is a patient I'm currently  seeing monthly. She is gone on to have a skin graft over her large venous insufficiency ulcer on the left medial ankle. This was done by Dr. Marla Roe him. The patient is a bit perturbed about why she didn't have one on her right medial ankle wound. She has been using silver alginate to this. 03/06/16; I received a phone call from her plastic surgery Dr. Marla Roe. She expressed some concern about the viability of the skin graft she did on the left medial ankle wound. Asked me to place Endoform on this. She told me she is not planning to do a subsequent skin graft on the right as the left one did not take very well. I had placed Hydrofera Blue on the right 03/13/16; continue to have a reasonably healthy wound on the right medial ankle. Down to 3 mm in terms of size. There is epithelialization here. The area on the left medial ankle is her skin graft site. I suppose the last week this looks somewhat better. She has an open area inferiorly however in the center there appears to be some viable tissue. There is a lot of surface callus and eschar that will eventually need to come off however none of this looked to be infected. Patient states that the is able to keep the dressing on for several days which is an improvement. 03/20/16 no major change in the circumference of either wound however on the left side the patient was at Dr. Eusebio Friendly office and they did a debridement of left wound. 50% of the wound seems to be epithelialized. I been using Endoform on the left Hydrofera Blue in the right  03/27/16; she arrives today with her wound is not looking as healthy as they did last week. The area on the right clearly has an adherent surface to this a very similar surface on the left. Unfortunately for this patient this is all too familiar problem. Clearly the Endoform is not working and will need to change that today that has some potential to help this surface. She does not tolerate debridement in this  clinic very well. She is changing the dressing wants 04/03/16; patient arrives with the wounds looking somewhat better especially on the right. Dr. Migdalia Dk change the dressing to silver alginate when she saw her on Monday and also sold her some compression socks. The usefulness of the latter is really not clear and woman with severely draining wounds. 04/10/16; the patient is doing a bit of an experiment wearing the compression stockings that Dr. Migdalia Dk provided her to her left leg and the out of legs based dressings that we provided to the right. 05/01/16; the patient is continuing to wear compression stockings Dr. Migdalia Dk provided her on the left that are apparently silver impregnated. She has been using Iodoflex to the right leg wound. Still a moderate amount of drainage, when she leaves here the wraps only last for 4 days. She has to change the stocking on the left leg every night 05/15/16; she is now using compression stockings bilaterally provided by Dr. Marla Roe. She is wearing a nonadherent layer over the wounds so really I don't think there is anything specific being done to this now. She has some reduction on the left wound. The right is stable. I think all healing here is being done without a specific dressing 06/09/16; patient arrives here today with not much change in the wound certainly in diameter to large circular wounds over the medial aspect of her ankle bilaterally. Under the light of these services are certainly not viable for healing. There is no evidence of surrounding infection. She is wearing compression stockings with some sort of silver impregnation as prescribed by Dr. Marla Roe. She has a follow-up with her tomorrow. 06/30/16; no major change in the size or condition of her wounds. These are still probably covered with a nonviable surface. She is using only her purchase stockings. She did see Dr. Marla Roe who seemed to want to apply Dakin's solution to this I'm not  extreme short what value this would be. I would suggest Iodoflex which she still has at home. 07/28/16; I follow Mrs. Reinig episodically along with Dr. Marla Roe. 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 She has been applying a topical collagen ointment to both wounds with Adaptic. I don't think Dr. Marla Roe is planning to take her back to the OR. 08/19/16; I follow Mrs. Jeneen Rinks on a monthly basis along with Dr. Marla Roe of plastic surgery. 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 I been following her for a number of years. At one point I was able to get the right medial malleolus wound to heal and had the left medial malleolus down to about half its current size however and I had to send her to plastic surgery for an operative debridement. Since then things have been stable to slightly improve the area on the right is slightly better one in the left about the same although there is much less adherent surface than I'm used to with this patient. She is using some form  of liquid collagen gel that Dr. Marla Roe provided a Kerlix cover with the patient's own pressure stockings. She tells me that she has extreme pain in both ankles and along the lateral aspect of both feet. She has been unable to work for some period of time. She is telling me she is retiring at the beginning of April. She sees Dr. Doran Durand of orthopedics next week 09/22/16; patient has not seen Dr. Marla Roe since the last time she is here. I'm not really sure what she is using to the wounds other than bits and pieces of think she had left over including most recently Hydrofera Blue. She is using juxtalite stockings. She is having difficulty with her husband's recent illness "stroke". She is having to transport him to various doctors appointments. Dr. Marla Roe left her the option of a repeat  debridement with ACEL however she has not been able to get the time to follow-up on this. She continues to have a fair amount of drainage out of these wounds with certainly precludes leaving dressings on all week 10/13/16; patient has not seen Dr. Marla Roe since she was last in our clinic. I'm not really sure what she is doing with the wounds, we did try to get her [facility] and I think she is actually using this most of the time. Because of drainage she states she has to change this every second day although this is an improvement from what she used to do. She went to see Dr. Doran Durand who did not think she had a muscular issue with regards to her feet, he referred her to a neurologist and I think the appointment is sometime in June. I changed her back to Iodoflex which she has used in the past but not recently. 11/03/16; the patient has been using Iodoflex although she ran out of this. Still claims that there is a lot of drainage although the wound does not look like this. No surrounding erythema. She has not been back to see Dr. Marla Roe 11/24/16; the patient has been using Iodoflex again but she ran out of it 2 or 3 days ago. There is no major change in the condition of either one of these wounds in fact they are larger and covered in a thick adherent surface slough/nonviable tissue especially on the left. She does not tolerate mechanical debridement in our clinic. Going back to see Dr. Marla Roe of plastic surgery for an operative debridement would seem reasonable. 12/15/16; the patient has not been back to see Dr. Marla Roe. She is been dealing with a series of illnesses and her husband which of monopolized her time. She is been using Sorbact which we largely supplied. She states the drainage is bad enough that it maximum she can go 2-3 days without changing the dressing 01/12/2017 -- the patient has not been back for about 4 weeks and has not seen Dr. Marla Roe not does she have any  appointment pending. 01/23/17; patient has not seen Dr. Marla Roe even though I suggested this previously. She is using Santyl that was suggested last week by Dr. Con Memos this Cost her $16 through her insurance which is indeed surprising 02/12/17; continuing Santyl and the patient is changing this daily. A lot of drainage. She has not been back to see plastic surgery she is using an Ace wrap. Our intake nurse suggested wrap around stockings which would make a good reasonable alternative 02/26/17; patient is been using Santyl and changing this daily due to drainage. She has not been to see plastic  surgery she uses in April Ace wrap to control the edema. She did obtain extremitease stockings but stated that the edema in her leg was to big for these 03/20/17; patient is using Santyl and Anasept. Surfaces looked better today the area on the right is actually measuring a little smaller. She has states she has a lot of pain in her feet and ankles and is asking for a consult to pain control which I'll try to help her with through our case manager. 04/10/17; the patient arrives with better-looking wound surfaces and is slightly smaller wound on the left she is using a combination of Santyl and Anasept. She has an appointment or at least as started in the pain control center associated with [facility] 05/14/17; this is a patient who I followed for a prolonged period of time. 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 At one point I had this down to a much smaller wound on the left however these reopened and we've never been able to get these to heal. She has been using Santyl and Anasept gel although 2 weeks ago she ran out of the Anasept gel. She has a stable appearance of the wound. She is going to the wound care clinic at [facility] They wanted do a nerve block/spinal block although she tells me she is reluctant to go forward with that. 05/21/17; this is a patient I  have followed for many years. 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 Chronic pain and deformity in her ankles as well. She is been to see plastic surgery as well as orthopedics. Using PolyMem AG most recently/Kerramax/ABDs and 2 layer compression. She has managed to keep this on and she is coming in for a nurse check to change the dressing on Tuesdays, we see her on Fridays 06/05/17; really quite a good looking surface and the area especially on the right medial has contracted in terms of dimensions. Well granulated healthy-looking tissue on both sides. Even with an open curet there is nothing that even feels abnormal here. This is as good as I've seen this in quite some time. We have been using PolyMem AG and bringing her in for a nurse check 06/12/17; really quite good surface on both of these wounds. The right medial has contracted a bit left is not. We've been using PolyMem and AG and she is coming in for a nurse visit 06/19/17; we have been using PolyMem AG and bringing her in for a nurse check. Dimensions of her wounds are not better but the surfaces looked better bilaterally. She complained of bleeding last night and the left wound and increasing pain bilaterally. She states her wound pain is more neuropathic than just the wounds. There was some suggestion that this was radicular from her pain management doctor in talking to her it is really difficult to sort this out. 06/26/17; using PolyMem and AG and bringing her in for a nurse check as All of this and reasonably stable condition. Certainly not improved. The dimensions on the lateral part of the right leg look better but not really measuring better. The medial aspect on the left is about the same. 07/03/16; we have been using PolyMen AG and bringing her in for a nurse check to change the dressings as the wounds have drainage which precludes once weekly changing. We are using all secondary absorptive  dressings.our intake nurse is brought up the idea of using a wound VAC/snap VAC on the wound  to help with the drainage to see if this would result in some contraction. This is not a bad idea. The area on the right medial is actually looking smaller. Both wounds have a reasonable-looking surface. There is no evidence of cellulitis. The edema is well controlled 07/10/17; the patient was denied for a snap VAC by her insurance. The major issue with these wounds continues to be drainage. We are using wicked PolyMem AG and she is coming in for a nurse visit to change this. The wounds are stable to slightly improved. The surface looks vibrant and the area on the right certainly has shrunk in size but very slowly 07/17/17; the patient still has large wounds on her bilateral medial malleoli. Surface of both of these wounds looks better. The dimensions seem to come and go but no consistent improvement. There is no epithelialization. We do not have options for advanced treatment products due to insurance issues. They did not approve of the wound VAC to help control the drainage. More recently we've been using PolyMem and AG wicked to allow drainage through. We have been bringing her in for a nurse visit to change this. We do not have a lot of options for wound care products and the home again due to insurance issues 07/24/17; the patient's wound actually looks somewhat better today. No drainage measurements are smaller still healthy-looking surface. We used silver collagen under PolyMen started last week. We have been bringing her in for a dressing change 07/31/17; patient's wound surface continued to look better and I think there is visible change in the dimensions of the wound on the right. Rims of epithelialization. We have been using silver collagen under PolyMen and bringing her in for a dressing change. There appears to be less drainage although she is still in need of the dressing change 08/07/17. Patient's  wound surface continues to look better on both sides and the area on the right is definitely smaller. We have been using silver collagen and PolyMen. She feels that the drainage has been it has been better. I asked her about her vascular status. She went to see Dr. Aleda Grana at [facility] and had some form of ablation. I don't have much detail on this. I haven't my notes from 2016 that she was not a candidate for any further ablation but I don't have any more information on this. We had referred her to vein and vascular I don't think she ever went. He does not have a history of PAD although I don't have any information on this either. We don't even have ABIs in our record 08/14/17; we've been using silver collagen and PolyMen cover. And putting the patient and compression. She we are bringing her in as a nurse visit to change this because ofarge amount of drainage. We didn't the ABIs in clinic today since they had been done in many moons 1.2 bilaterally. She has been to see vein and vascular however this was at [facility] and she had ablation although I really don't have any information on this all seemed biking get a report. She is also been operatively debrided by plastic surgery and had a cell placed probably 8-12 months ago. This didn't have a major effect. We've been making some gains with current dressings 08/19/17-She is here in follow-up evaluation for bilateral medial malleoli ulcers. She continues to tolerate debridement very poorly. We will continue with recently changed topical treatment; if no significant improvement may consider switching to Iodosorb/Iodoflex. 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 She has been using silver collagen and PolyMem. I believe she has been used and tried on Iodoflex before. During her trip to the clinic we've been watching her wound with Anasept spray and I would like to encourage this on thenurse  visit days 09/04/17 bilateral medial malleoli ulcers area is her chronic related to chronic venous insufficiency. These have been very refractory over time. We have been using silver collagen and PolyMen. She is coming in once a week for a doctor's and once a week for nurse visits. We are actually making some progress 09/18/17; the patient's wounds are smaller especially on the right medial. She arrives today to upset to consider even washing these off with Anasept which I think is been part of the reason this is been closing. We've been using collagen covered in PolyMen otherwise. It is noted that she has a small area of folliculitis on the right medial calf that. As we are wrapping her legs I'll give her a short course of doxycycline to make sure this doesn't amount to anything. She is a long list of complaints today including imbalance, shortness of breath on exertion, inversion of her left ankle. With regards to the latter complaints she is been to see orthopedics and they offered her a tendon release surgery I believe but wanted her wounds to be closed first. I have recommended she go see her primary physician with regards to everything else. 09/25/17; patient's wounds are about the same size. We have made some progress bilaterally although not in recent weeks. She will not allow me T wash these wounds with Anasept even if she is doing her cell. Wheeze we've been using collagen covered in PolyMen. Last week she had a small area of folliculitis this is now opened into a small wound. She completed 5 days of trimethoprim sulfamethoxazole 10/02/17; unfortunately the area on her left medial ankle is worse with a larger wound area towards the Achilles. The patient complains of a lot of pain. She will not allow debridement although visually I don't think there is anything to debridement in any case. We have been using silver collagen and PolyMen for several months now. Initially we are making some  progress although I'm not really seeing that today. We will move back to [facility]. His admittedly this is a bit of a repeat however I'm hoping that his situation is different now. The patient tells me she had her leg on the left give out on her yesterday this is process some pain. 10/09/17; the patient is seen twice a week largely because of drainage issues coming out of the chronic medial bimalleolar wounds that are chronic. Last week the dimensions of the one on the left looks a little larger I changed her to [facility]. She comes in today with a history of terrible pain in the bilateral wound areas. She will not allow debridement. She will not even allow a tissue culture. There is no surrounding erythema no no evidence of cellulitis. We have been putting her Kerlix Coban man. She will not allow more aggressive compression as there was a suggestion to put her in 3 layer wraps. 10/16/17; large wounds on her bilateral medial malleoli. These are chronic. Not much change from last week. The surface looks have healthy but absolutely no epithelialization. A lot of pain little less so of drainage. She will not allow debridement or even washing these off in the vigorous fashion with  Anasept. 10/23/17; large wounds on her bilateral malleoli which are chronic. Some improvement in terms of size perhaps on the right since last time I saw these. She states that after we increased the 3 layer compression there was some bleeding, when she came in for a nurse visit she did not want 3 layer compression put back on about our nurse managed to convince her. She has known chronic venous visit issues and I'm hoping to get her to tolerate the 3 layer compression. using Hydrofera Blue 10/30/17; absolutely no change in the condition of either wound although we've had some improvement in dimensions on the right.. Attempted to put her in 3 layer compression she didn't tolerated she is back in 2 layer  compression. We've been using Hydrofera Blue We looked over her past records. She had venous reflux studies in November 2016. There was no evidence of deep venous reflux on the right. Superficial vein did not show the greater saphenous vein at think this is been previously ablated the small saphenous vein was within normal limits. The left deep venous system showed no DVT the vessels were positive for deep venous reflux in the posterior tibial veins at the ankle. The greater saphenous vein was surgically absent small saphenous vein was within normal limits. She went to vein and vascular at [facility]. I believe she had an ablation on the left greater saphenous vein. I'll update her reflux studies perhaps ever reviewed by vein and vascular. We've made absolutely no progress in these wounds. Will also try to read and TheraSkins through her insurance 11/06/17; W the patient apparently has a 2 week follow-up with vein and vascular I like him to review the whole issue with regards to her previous vascular workup by Dr. Aleda Grana. We've really made no progress on these wounds in many months. She arrives today with less viable looking surface on the left medial ankle wound. This was apparently looking about the same on Tuesday when she was here for nurse visit. 11/13/17; deep tissue culture I did last time of the left lower leg showed multiple organisms without any predominating. In particular no Staphylococcus or group A strep were isolated. We sent her for venous reflux studies. She's had a previous left greater saphenous vein stripping and I think sclerotherapy of the right greater saphenous vein. She didn't really look at the lesser saphenous vein this both wounds are on the medial aspect. She has reflux in the common femoral vein and popliteal vein and an accessory vein on the right and the common femoral vein and popliteal vein on the left. I'm going to have her go to see vein and vascular just  the look over things and see if anything else beside aggressive compression is indicated here. We have not been able to make any progress on these wounds in spite of the fact that the surface of the wounds is never look too bad. 11/20/17; no major change in the condition of the wounds. Patient reports a large amount of drainage. She has a lot of complaints of pain although enlisting her today I wonder if some of this at least his neuropathic rather than secondary to her wounds. She has an appointment with vein and vascular on 12/30/17. The refractory nature of these wounds in my mind at least need vein and vascular to look over the wounds the recent reflux studies we did and her history to see if anything further can be done here. I also note her gait is deteriorated  quite a bit. 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 She has a bilateral Trendelenburg gait. I wonder if this is neuropathic or perhaps multilevel radicular. 11/27/17; her wounds actually looks slightly better. Healthy-looking granulation tissue a scant amount of epithelialization. [place] healthcare will not pay for [employer]. They will play for tri layer Oasis and Dermagraft. This is not a diabetic ulcer. We'll try for the tri layer Oasis. She still complains of some drainage. She has a vein and vascular appointment on 12/30/17 12/04/17; the wounds visually look quite good. Healthy-looking granulation with some degree of epithelialization. We are still waiting for response to our request for trial to try layer Oasis. Her appointment with vascular to review venous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

## 2019-04-15 ENCOUNTER — Other Ambulatory Visit: Payer: Self-pay

## 2019-04-15 ENCOUNTER — Encounter (HOSPITAL_BASED_OUTPATIENT_CLINIC_OR_DEPARTMENT_OTHER): Payer: Medicare Other | Admitting: Internal Medicine

## 2019-04-15 DIAGNOSIS — L97321 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 breakdown of skin: Secondary | ICD-10-CM | POA: Diagnosis not present

## 2019-04-15 NOTE — Progress Notes (Signed)
TANIQUA, PETTINGER ([id number]) Visit Report for 04/15/2019 Debridement Details Patient Name: Date of Service: [name], [name] 04/15/2019 10:00 AM Medical Record [id number] Patient Account Number: [id number] Date of Birth/Sex: [birth year] (63 y.o. F) Treating RN: Kela Millin Primary Care Provider: [name] Other Clinician: Referring Provider: Treating Provider/Extender:Robson, Tawny Asal in Treatment: 331 Debridement Performed for Wound #3 Left,Medial Malleolus Assessment: Performed By: Physician [name]., MD Debridement Type: Debridement Severity of Tissue Pre Fat layer exposed Debridement: Level of Consciousness (Pre- Awake and Alert procedure): Pre-procedure Verification/Time Out Taken: Yes - 10:49 Start Time: 10:49 Pain Control: Other : benzocaine, 20% Total Area Debrided (L x W): 1 (cm) x 1.2 (cm) = 1.2 (cm) Tissue and other material Slough, Subcutaneous, Slough debrided: Level: Skin/Subcutaneous Tissue Debridement Description: Excisional Instrument: Curette Bleeding: Minimum Hemostasis Achieved: Pressure End Time: 10:50 Response to Treatment: Procedure was tolerated well Level of Consciousness Awake and Alert (Post-procedure): Post Debridement Measurements of Total Wound Length: (cm) 1 Width: (cm) 1.2 Depth: (cm) 0.1 Volume: (cm) 0.094 Character of Wound/Ulcer Post Improved Debridement: Severity of Tissue Post Debridement: Fat layer exposed Post Procedure Diagnosis Same as Pre-procedure Electronic Signature(s) Signed: 04/15/2019 5:20:59 PM By: Kela Millin Signed: 04/15/2019 5:33:43 PM By: Linton Ham MD Entered By: Linton Ham on 04/15/2019 11:06:41 -------------------------------------------------------------------------------- HPI Details Patient Name: Date of Service: [name] 04/15/2019 10:00 AM Medical Record [id number] Patient Account Number: [id number] Date of  Birth/Sex: Treating RN: 1955-08-08 (63 y.o. [name] Primary Care Provider: [name] Other Clinician: Referring Provider: Treating Provider/Extender:Robson, Tawny Asal in Treatment: 331 History of Present Illness HPI Description: the remaining wound is over the left medial ankle. Similar wound over the right medial ankle healed largely with use of Apligraf. Most recently we have been using Hydrofera Blue over this wound with considerable improvement. The patient has been extensively worked up in the past for her venous insufficiency and she is not a candidate for antireflux surgery although I have none of the details available currently. 08/24/14; considerable improvement today. About 50% of this wound areas now epithelialized. The base of the wound appears to be healthier granulation.as opposed to last week when she had deteriorated a considerable improvement 08/17/14; unfortunately the wound has regressed somewhat. The areas of epithelialization from the superior aspect are not nearly as healthy as they were last week. The patient thinks her Hydrofera Blue slipped. 09/07/14; unfortunately the area has markedly regressed in the 2 weeks since I've seen this. There is an odor surrounding erythema. The healthy granulation tissue that we had at the base of the wound now is a dusky color. The nurse reports green drainage 09/14/14; the area looks somewhat better than last week. There is less erythema and less drainage. The culture I did did not show any growth. Nevertheless I think it is better to continue the Cipro and doxycycline for a further week. The remaining wound area was debridement. 09/21/14. Wound did not require debridement last week. Still less erythema and less drainage. She can complete her antibiotics. The areas of epithelialization in the superior aspect of the wound do not look as healthy as they did some weeks ago 10/05/14 continued improvement in  the condition of this wound. There is advancing epithelialization. Less aggressive debridement required 10/19/14 continued improvement in the condition and volume of this wound. Less aggressive debridement to the inferior part of this to remove surface slough and fibrinous eschar 11/02/14 no debridement is required. The surface granulation appears healthy although some  of her islands of epithelialization seem to have regressed. No evidence of infection 11/16/14; lites surface debridement done of surface eschar. The wound does not look to be unhealthy. No evidence of infection. Unfortunately the patient has had podiatry issues in the right foot and for some reason has redeveloped small surface ulcerations in the medial right ankle. Her original presentation involved wounds in this area 11/23/14 no debridement. The area on the right ankle has enlarged. The left ankle wound appears stable in terms of the surface although there is periwound inflammation. There has been regression in the amount of new skin 11/30/14 no debridement. Both wound areas appear healthy. There was no evidence of infection. The the new area on the right medial ankle has enlarged although that both the surfaces appear to be stable. 12/07/14; Debridement of the right medial ankle wound. No no debridement was done on the left. 12/14/14 no major change in and now bilateral medial ankle wounds. Both of these are very painful but the no overt evidence of infection. She has had previous venous ablation 12/21/14; patient states that her right medial ankle wound is considerably more painful last week than usual. Her left is also somewhat painful. She could not tolerate debridement. The right medial ankle wound has fibrinous surface eschar 12/28/14 this is a patient with severe bilateral venous insufficiency ulcers. For a considerable period of time we actually had the one on the right medial ankle healed however this recently opened up again in  June. The left medial ankle wound has been a refractory area with some absent flows. We had some success with Hydrofera Blue on this area and it literally closed by 50% however it is recently opened up Foley. Both of these were debridement today of surface eschar. She tolerates this poorly 01/25/15: No change in the status of this. Thick adherent escar. Very poor tolerance of any attempt at debridement. I had healed the right medial malleolus wound for a considerable amount of time and had the left one down to about 50% of the volume although this is totally regressed over the last 48 weeks. Further the right leg has reopened. she is trying to make a appointment with pain and vascular, previous ablations with Dr. Aleda Grana. I do not believe there is an arterial insufficiency issue here 02/01/15 the status of the adherent eschar bilaterally is actually improved. No debridement was done. She did not manage to get vascular studies done 02/08/15 continued debridement of the area was done today. The slough is less adherent and comes off with less pressure. There is no surrounding infection peripheral pulses are intact 02/15/15 selective debridement with a disposable curette. Again the slough is less adherent and comes off with less difficulty. No surrounding infection peripheral pulses are intact. 02/22/15 selective debridement of the right medial ankle wound. Slough comes off with less difficulty. No obvious surrounding infection peripheral pulses are intact I did not debridement the one on the left. Both of these are stable to improved 03/01/15 selective debridement of both wound areas using a curette to. Adherent slough cup soft with less difficulty. No obvious surrounding infection. The patient tells me that 2 days ago she noted a rash above the right leg wrap. She did not have this on her lower legs when she change this over she arrives with widespread left greater than right almost  folliculitis-looking rash which is extremely pruritic. I don't see anything to culture here. There is no rash on the rest of her body. She  feels well systemically. 03/08/15; selective debridement of both wounds using a curette. Base of this does not look unhealthy. She had limegreen drainage coming out of the left leg wound and describes a lot of drainage. The rash on her left leg looks improved to. No cultures were done. 03/22/15; patient was not here last week. Basal wounds does not look healthy and there is no surrounding erythema. No drainage. There is still a rash on the left leg that almost looks vasculitic however it is clearly limited to the top of where the wrap would be. 04/05/15; on the right required a surgical debridement of surface eschar and necrotic subcutaneous tissue. I did not debridement the area on the left. These continue to be large open wounds that are not changing that much. We were successful at one point in healing the area on the right, and at the same time the area on the left was roughly half the size of current measurements. I think a lot of the deterioration has to do with the prolonged time the patient is on her feet at work 04/19/15 I attempted-like surface debridement bilaterally she does not tolerate this. She tells me that she was in allergic care yesterday with extreme pain over her left lateral malleolus/ankle and was told that she has an "sprain" 05/03/15; large bilateral venous insufficiency wounds over the medial malleolus/medial aspect of her ankles. She complains of copious amounts of drainage and his usual large amounts of pain. There is some increasing erythema around the wound on the right extending into the medial aspect of her foot to. historically she came in with these wounds the right one healed and the left one came down to roughly half its current size however the right one is reopened and the left is expanded. 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 05/10/15 large bilateral venous insufficiency wounds. There is less adherence surface left however the surface culture that I did last week grew pseudomonas therefore bilateral selective debridement score necessary. There is surrounding erythema. The patient describes severe bilateral drainage and a lot of pain in the left ankle. Apparently her podiatrist was were ready to do a cortisone shot 05/17/15; the patient complains of pain and again copious amounts of drainage. 05/24/15; we used Iodo flex last week. Patient notes considerable improvement in wound drainage. Only needed to change this once. 05/31/15; we continued Iodoflex; the base of these large wounds bilaterally is not too bad but there is probably likely a significant bioburden here. I would like to debridement just doesn't tolerate it. 06/06/14 I would like to continue the Iodoflex although she still hasn't managed to obtain supplies. She has bilateral medial malleoli or large wounds which are mostly superficial. Both of them are covered circumferentially with some nonviable fibrinous slough although she tolerates debridement very poorly. She apparently has an appointment for an ablation on the right leg by interventional radiology. 06/14/15; the patient arrives with the wounds and static condition. We attempted a debridement although she does not do well with this secondary to pain. I 07/05/15; wounds are not much smaller however there appears to be a cleaner granulating base. The left has tight fibrinous slough greater than the right. Debridement is tolerated poorly due to pain. Iodoflex is done more for these wounds in any of the multitude of different dressings I have tried on the left 1 and then subsequently the right. 07/12/15; no change  in the condition of this wound. I am able to do an aggressive debridement on the right but not the left. She simply cannot tolerate it. We have  been using Iodoflex which helps somewhat. It is worthwhile remembering that at one point we healed the right medial ankle wound and the left was about 25% of the current circumference. We have suggested returning to vascular surgery for review of possible further ablations for one reason or another she has not been able to do this. 07/26/15 no major change in the condition of either wound on her medial ankle. I did not attempt to debridement of these. She has been aggressively scrubbing these while she is in the shower at home. She has her supply of Iodoflex which seems to have done more for these wounds then anything I have put on recently. 08/09/15 wound area appears larger although not verified by measurements. Using Iodoflex 09/05/2015 -- she was here for avisit today but had significant problems with the wound and I was asked to see her for a physician opinion. I have summarize that this lady has had surgery on her left lower extremity about 10 years ago where the possible veins stripping was done. She has had an opinion from interventional radiology around November 2016 where no further sclerotherapy was ordered. The patient works 12 hours a day and stands on a concrete floor with work boots and is unable to get the proper compression she requires and cannot elevate her limbs appropriately at any given time. She has recently grown Pseudomonas from her wound culture but has not started her ciprofloxacin which was called in for her. 09/13/15 this continues to be a difficult situation for this patient. At one point I had this wound down to a 1.5 x 1.5" wound on her left leg. This is deteriorated and the right leg has reopened. She now has substantial wounds on her medial calcaneus, malleoli and into her lower leg. One on the left has surface eschar but these are far too painful for me to debridement here. She has a vascular surgery appointment next week to see if anything can be done to help here.  I think she has had previous ablations several years ago at [facility]. She has no major edema. She tells me that she did not get product last time [facility] and went for several days without it. She continues to work in work boots 12 hours a day. She cannot get compression/4-layer under her work boots. 09/20/15 no major change. Periwound edema control was not very good. Her point with pain and vascular is next Wednesday the 25th 09/28/15; the patient is seen vascular surgery and is apparently scheduled for repeat duplex ultrasounds of her bilateral lower legs next week. 10/05/15; the patient was seen by Dr. Doren Custard of vascular surgery. He feels that she should have arterial insufficiency excluded as cause/contributed to her nonhealing stage she is therefore booked for an arteriogram. She has apparently monophasic signals in the dorsalis pedis pulses. She also of course has known severe chronic venous insufficiency with previous procedures as noted previously. I had another long discussion with the patient today about her continuing to work 12 hour shifts. I've written her out for 2 months area had concerns about this as her work location is currently undergoing significant turmoil and this may lead to her termination. She is aware of this however I agree with her that she simply cannot continue to stand for 12 hours multiple days a week  with the substantial wound areas she has. 10/19/15; the Dr. Doren Custard appointment was largely for an arteriogram which was normal. She does not have an arterial issue. He didn't make a comment about her chronic venous insufficiency for which she has had previous ablations. Presumably it was not felt that anything additional could be done. The patient is now out of work as I prescribed 2 weeks ago. Her wounds look somewhat less aggravated presumably because of this. I felt I would give debridement another try today 10/25/15; no major change in this patient's wounds. We  are struggling to get her product that she can afford into her own home through her insurance. 11/01/15; no major change in the patient's wounds. I have been using silver alginate as the most affordable product. I spoke to Dr. Marla Roe last week with her requested take her to the OR for surgical debridement and placement of ACEL. Dr. Marla Roe told me that she would be willing to do this however [facility] will not cover this, fortunately the patient has [place] healthcare of some variant 11/08/15; no major change in the patient's wounds. She has been completely nonviable surface that this but is in too much pain with any attempted debridement are clinic. I have arranged for her to see Dr. Marla Roe ham of plastic surgery and this appointment is on Monday. I am hopeful that they will take her to the OR for debridement, possible ACEL ultimately possible skin graft 11/22/15 no major change in the patient's wounds over her bilateral medial calcaneus medial malleolus into the lower legs. Surface on these does not look too bad however on the left there is surrounding erythema and tenderness. This may be cellulitis or could him sleepy tinea. 11/29/15; no major changes in the patient's wounds over her bilateral medial malleolus. There is no infection here and I don't think any additional antibiotics are necessary. There is now plan to move forward. She sees Dr. Marla Roe in a week's time for preparation for operative debridement and ACEL placement I believe on 7/12. She then has a follow-up appointment with Dr. Marla Roe on 7/21 12/28/15; the patient returns today having been taken to the [facility] by Dr. Marla Roe 12/12/15 she underwent debridement, intraoperative cultures [which were negative]. She had placement of a wound VAC. Parent really ACEL was not available to be placed. The wound VAC foam apparently adhered to the wound since then she's been using silver alginate, Xeroform under Ace wraps.  She still says there is a lot of drainage and a lot of pain 01/31/16; this is a patient I see monthly. I had referred her to Dr. Marla Roe him of plastic surgery for large wounds on her bilateral medial ankles. She has been to the OR twice once in early July and once in early August. She tells me over the last 3 weeks she has been using the wound VAC with ACEL underneath it. On the right we've simply been using silver alginate. Under Kerlix Coban wraps. 02/28/16; this is a patient I'm currently seeing monthly. She is gone on to have a skin graft over her large venous insufficiency ulcer on the left medial ankle. This was done by Dr. Marla Roe him. The patient is a bit perturbed about why she didn't have one on her right medial ankle wound. She has been using silver alginate to this. 03/06/16; I received a phone call from her plastic surgery Dr. Marla Roe. She expressed some concern about the viability of the skin graft she did on the  left medial ankle wound. Asked me to place Endoform on this. She told me she is not planning to do a subsequent skin graft on the right as the left one did not take very well. I had placed Hydrofera Blue on the right 03/13/16; continue to have a reasonably healthy wound on the right medial ankle. Down to 3 mm in terms of size. There is epithelialization here. The area on the left medial ankle is her skin graft site. I suppose the last week this looks somewhat better. She has an open area inferiorly however in the center there appears to be some viable tissue. There is a lot of surface callus and eschar that will eventually need to come off however none of this looked to be infected. Patient states that the is able to keep the dressing on for several days which is an improvement. 03/20/16 no major change in the circumference of either wound however on the left side the patient was at Dr. Eusebio Friendly office and they did a debridement of left wound. 50% of the wound  seems to be epithelialized. I been using Endoform on the left Hydrofera Blue in the right 03/27/16; she arrives today with her wound is not looking as healthy as they did last week. The area on the right clearly has an adherent surface to this a very similar surface on the left. Unfortunately for this patient this is all too familiar problem. Clearly the Endoform is not working and will need to change that today that has some potential to help this surface. She does not tolerate debridement in this clinic very well. She is changing the dressing wants 04/03/16; patient arrives with the wounds looking somewhat better especially on the right. Dr. Migdalia Dk change the dressing to silver alginate when she saw her on Monday and also sold her some compression socks. The usefulness of the latter is really not clear and woman with severely draining wounds. 04/10/16; the patient is doing a bit of an experiment wearing the compression stockings that Dr. Migdalia Dk provided her to her left leg and the out of legs based dressings that we provided to the right. 05/01/16; the patient is continuing to wear compression stockings Dr. Migdalia Dk provided her on the left that are apparently silver impregnated. She has been using Iodoflex to the right leg wound. Still a moderate amount of drainage, when she leaves here the wraps only last for 4 days. She has to change the stocking on the left leg every night 05/15/16; she is now using compression stockings bilaterally provided by Dr. Marla Roe. She is wearing a nonadherent layer over the wounds so really I don't think there is anything specific being done to this now. She has some reduction on the left wound. The right is stable. I think all healing here is being done without a specific dressing 06/09/16; patient arrives here today with not much change in the wound certainly in diameter to large circular wounds over the medial aspect of her ankle bilaterally. Under the light of  these services are certainly not viable for healing. There is no evidence of surrounding infection. She is wearing compression stockings with some sort of silver impregnation as prescribed by Dr. Marla Roe. She has a follow-up with her tomorrow. 06/30/16; no major change in the size or condition of her wounds. These are still probably covered with a nonviable surface. She is using only her purchase stockings. She did see Dr. Marla Roe who seemed to want to apply Dakin's solution to this  I'm not extreme short what value this would be. I would suggest Iodoflex which she still has at home. 07/28/16; I follow Mrs. Vondrak episodically along with Dr. Marla Roe. 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 She has been applying a topical collagen ointment to both wounds with Adaptic. I don't think Dr. Marla Roe is planning to take her back to the OR. 08/19/16; I follow Mrs. Jeneen Rinks on a monthly basis along with Dr. Marla Roe of plastic surgery. 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 I been following her for a number of years. At one point I was able to get the right medial malleolus wound to heal and had the left medial malleolus down to about half its current size however and I had to send her to plastic surgery for an operative debridement. Since then things have been stable to slightly improve the area on the right is slightly better one in the left about the same although there is much less adherent surface than I'm used to with this patient. She is using some form of liquid collagen gel that Dr. Marla Roe provided a Kerlix cover with the patient's own pressure stockings. She tells me that she has extreme pain in both ankles and along the lateral aspect of both feet. She has been unable to work for some period of time. She is telling me she is retiring at the beginning  of April. She sees Dr. Doran Durand of orthopedics next week 09/22/16; patient has not seen Dr. Marla Roe since the last time she is here. I'm not really sure what she is using to the wounds other than bits and pieces of think she had left over including most recently Hydrofera Blue. She is using juxtalite stockings. She is having difficulty with her husband's recent illness "stroke". She is having to transport him to various doctors appointments. Dr. Marla Roe left her the option of a repeat debridement with ACEL however she has not been able to get the time to follow-up on this. She continues to have a fair amount of drainage out of these wounds with certainly precludes leaving dressings on all week 10/13/16; patient has not seen Dr. Marla Roe since she was last in our clinic. I'm not really sure what she is doing with the wounds, we did try to get her [facility] and I think she is actually using this most of the time. Because of drainage she states she has to change this every second day although this is an improvement from what she used to do. She went to see Dr. Doran Durand who did not think she had a muscular issue with regards to her feet, he referred her to a neurologist and I think the appointment is sometime in June. I changed her back to Iodoflex which she has used in the past but not recently. 11/03/16; the patient has been using Iodoflex although she ran out of this. Still claims that there is a lot of drainage although the wound does not look like this. No surrounding erythema. She has not been back to see Dr. Marla Roe 11/24/16; the patient has been using Iodoflex again but she ran out of it 2 or 3 days ago. There is no major change in the condition of either one of these wounds in fact they are larger and covered in a thick adherent surface slough/nonviable tissue especially on the left. She does not tolerate mechanical debridement in  our clinic. Going back to see Dr. Marla Roe of plastic  surgery for an operative debridement would seem reasonable. 12/15/16; the patient has not been back to see Dr. Marla Roe. She is been dealing with a series of illnesses and her husband which of monopolized her time. She is been using Sorbact which we largely supplied. She states the drainage is bad enough that it maximum she can go 2-3 days without changing the dressing 01/12/2017 -- the patient has not been back for about 4 weeks and has not seen Dr. Marla Roe not does she have any appointment pending. 01/23/17; patient has not seen Dr. Marla Roe even though I suggested this previously. She is using Santyl that was suggested last week by Dr. Con Memos this Cost her $16 through her insurance which is indeed surprising 02/12/17; continuing Santyl and the patient is changing this daily. A lot of drainage. She has not been back to see plastic surgery she is using an Ace wrap. Our intake nurse suggested wrap around stockings which would make a good reasonable alternative 02/26/17; patient is been using Santyl and changing this daily due to drainage. She has not been to see plastic surgery she uses in April Ace wrap to control the edema. She did obtain extremitease stockings but stated that the edema in her leg was to big for these 03/20/17; patient is using Santyl and Anasept. Surfaces looked better today the area on the right is actually measuring a little smaller. She has states she has a lot of pain in her feet and ankles and is asking for a consult to pain control which I'll try to help her with through our case manager. 04/10/17; the patient arrives with better-looking wound surfaces and is slightly smaller wound on the left she is using a combination of Santyl and Anasept. She has an appointment or at least as started in the pain control center associated with [facility] 05/14/17; this is a patient who I followed for a prolonged period of time. She has venous insufficiency ulcers on  her bilateral medial ankles. At one point I had this down to a much smaller wound on the left however these reopened and we've never been able to get these to heal. She has been using Santyl and Anasept gel although 2 weeks ago she ran out of the Anasept gel. She has a stable appearance of the wound. She is going to the wound care clinic at [facility]. They wanted do a nerve block/spinal block although she tells me she is reluctant to go forward with that. 05/21/17; this is a patient I have followed for many years. 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 Chronic pain and deformity in her ankles as well. She is been to see plastic surgery as well as orthopedics. Using PolyMem AG most recently/Kerramax/ABDs and 2 layer compression. She has managed to keep this on and she is coming in for a nurse check to change the dressing on Tuesdays, we see her on Fridays 06/05/17; really quite a good looking surface and the area especially on the right medial has contracted in terms of dimensions. Well granulated healthy-looking tissue on both sides. Even with an open curet there is nothing that even feels abnormal here. This is as good as I've seen this in quite some time. We have been using PolyMem AG and bringing her in for a nurse check 06/12/17; really quite good surface on both of these wounds. The right medial has contracted a bit left is  not. We've been using PolyMem and AG and she is coming in for a nurse visit 06/19/17; we have been using PolyMem AG and bringing her in for a nurse check. Dimensions of her wounds are not better but the surfaces looked better bilaterally. She complained of bleeding last night and the left wound and increasing pain bilaterally. She states her wound pain is more neuropathic than just the wounds. There was some suggestion that this was radicular from her pain management doctor in talking to her it is really difficult to sort  this out. 06/26/17; using PolyMem and AG and bringing her in for a nurse check as All of this and reasonably stable condition. Certainly not improved. The dimensions on the lateral part of the right leg look better but not really measuring better. The medial aspect on the left is about the same. 07/03/16; we have been using PolyMen AG and bringing her in for a nurse check to change the dressings as the wounds have drainage which precludes once weekly changing. We are using all secondary absorptive dressings.our intake nurse is brought up the idea of using a wound VAC/snap VAC on the wound to help with the drainage to see if this would result in some contraction. This is not a bad idea. The area on the right medial is actually looking smaller. Both wounds have a reasonable-looking surface. There is no evidence of cellulitis. The edema is well controlled 07/10/17; the patient was denied for a snap VAC by her insurance. The major issue with these wounds continues to be drainage. We are using wicked PolyMem AG and she is coming in for a nurse visit to change this. The wounds are stable to slightly improved. The surface looks vibrant and the area on the right certainly has shrunk in size but very slowly 07/17/17; the patient still has large wounds on her bilateral medial malleoli. Surface of both of these wounds looks better. The dimensions seem to come and go but no consistent improvement. There is no epithelialization. We do not have options for advanced treatment products due to insurance issues. They did not approve of the wound VAC to help control the drainage. More recently we've been using PolyMem and AG wicked to allow drainage through. We have been bringing her in for a nurse visit to change this. We do not have a lot of options for wound care products and the home again due to insurance issues 07/24/17; the patient's wound actually looks somewhat better today. No drainage measurements are smaller  still healthy-looking surface. We used silver collagen under PolyMen started last week. We have been bringing her in for a dressing change 07/31/17; patient's wound surface continued to look better and I think there is visible change in the dimensions of the wound on the right. Rims of epithelialization. We have been using silver collagen under PolyMen and bringing her in for a dressing change. There appears to be less drainage although she is still in need of the dressing change 08/07/17. Patient's wound surface continues to look better on both sides and the area on the right is definitely smaller. We have been using silver collagen and PolyMen. She feels that the drainage has been it has been better. I asked her about her vascular status. She went to see Dr. Aleda Grana at [facility] and had some form of ablation. I don't have much detail on this. I haven't my notes from 2016 that she was not a candidate for any further ablation but I  don't have any more information on this. We had referred her to vein and vascular I don't think she ever went. He does not have a history of PAD although I don't have any information on this either. We don't even have ABIs in our record 08/14/17; we've been using silver collagen and PolyMen cover. And putting the patient and compression. She we are bringing her in as a nurse visit to change this because ofarge amount of drainage. We didn't the ABIs in clinic today since they had been done in many moons 1.2 bilaterally. She has been to see vein and vascular however this was at [facility] and she had ablation although I really don't have any information on this all seemed biking get a report. She is also been operatively debrided by plastic surgery and had a cell placed probably 8-12 months ago. This didn't have a major effect. We've been making some gains with current dressings 08/19/17-She is here in follow-up evaluation for bilateral medial malleoli ulcers. She  continues to tolerate debridement very poorly. We will continue with recently changed topical treatment; if no significant improvement may consider switching to Iodosorb/Iodoflex. 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 She has been using silver collagen and PolyMem. I believe she has been used and tried on Iodoflex before. 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 These have been very refractory over time. We have been using silver collagen and PolyMen. She is coming in once a week for a doctor's and once a week for nurse visits. We are actually making some progress 09/18/17; the patient's wounds are smaller especially on the right medial. She arrives today to upset to consider even washing these off with Anasept which I think is been part of the reason this is been closing. We've been using collagen covered in PolyMen otherwise. It is noted that she has a small area of folliculitis on the right medial calf that. As we are wrapping her legs I'll give her a short course of doxycycline to make sure this doesn't amount to anything. She is a long list of complaints today including imbalance, shortness of breath on exertion, inversion of her left ankle. With regards to the latter complaints she is been to see orthopedics and they offered her a tendon release surgery I believe but wanted her wounds to be closed first. I have recommended she go see her primary physician with regards to everything else. 09/25/17; patient's wounds are about the same size. We have made some progress bilaterally although not in recent weeks. She will not allow me T wash these wounds with Anasept even if she is doing her cell. Wheeze we've been using collagen covered in PolyMen. Last  week she had a small area of folliculitis this is now opened into a small wound. She completed 5 days of trimethoprim sulfamethoxazole 10/02/17; unfortunately the area on her left medial ankle is worse with a larger wound area towards the Achilles. The patient complains of a lot of pain. She will not allow debridement although visually I don't think there is anything to debridement in any case. We have been using silver collagen and PolyMen for several months now. Initially we are making some progress although I'm not really seeing that today. We will [facility] to [facility]. His admittedly this is a bit of a repeat however I'm hoping that his situation is different now. The patient tells me she had her leg on the left give out on her yesterday this is process some pain. 10/09/17; the patient is seen twice a week largely because of drainage issues coming out of the chronic medial bimalleolar wounds that are chronic. Last week the dimensions of the one on the left looks a little larger I changed her to [facility]. She comes in today with a history of terrible pain in the bilateral wound areas. She will not allow debridement. She will not even allow a tissue culture. There is no surrounding erythema no no evidence of cellulitis. We have been putting her Kerlix Coban man. She will not allow more aggressive compression as there was a suggestion to put her in 3 layer wraps. 10/16/17; large wounds on her bilateral medial malleoli. These are chronic. Not much change from last week. The surface looks have healthy but absolutely no epithelialization. A lot of pain little less so of drainage. She will not allow debridement or even washing these off in the vigorous fashion with Anasept. 10/23/17; large wounds on her bilateral malleoli which are chronic. Some improvement in terms of size perhaps on the right since last time I saw these. She states that after we increased the 3 layer compression there was  some bleeding, when she came in for a nurse visit she did not want 3 layer compression put back on about our nurse managed to convince her. She has known chronic venous visit issues and I'm hoping to get her to tolerate the 3 layer compression. using Hydrofera Blue 10/30/17; absolutely no change in the condition of either wound although we've had some improvement in dimensions on the right.. Attempted to put her in 3 layer compression she didn't tolerated she is back in 2 layer compression. We've been using Hydrofera Blue We looked over her past records. She had venous reflux studies in November 2016. There was no evidence of deep venous reflux on the right. Superficial vein did not show the greater saphenous vein at think this is been previously ablated the small saphenous vein was within normal limits. The left deep venous system showed no DVT the vessels were positive for deep venous reflux in the posterior tibial veins at the ankle. The greater saphenous vein was surgically absent small saphenous vein was within normal limits. She went to vein and vascular at [facility]. I believe she had an ablation on the left greater saphenous vein. I'll update her reflux studies perhaps ever reviewed by vein and vascular. We've made absolutely no progress in these wounds. Will also try to read and TheraSkins through her insurance 11/06/17; W the patient apparently has a 2 week follow-up with vein and vascular I like him to review the whole issue with regards to her previous vascular workup by Dr. Aleda Grana. We've really made no progress on these wounds in many months. She arrives today with less viable looking surface on the left medial ankle wound. This was apparently looking about the same on Tuesday when she was here for nurse visit. 11/13/17; deep tissue culture I did last time of the left lower leg showed multiple organisms without any predominating. In particular no Staphylococcus or group A strep  were isolated. We sent her for venous reflux studies. She's had a previous left greater saphenous vein stripping and I think sclerotherapy of the right greater saphenous vein. She didn't  really look at the lesser saphenous vein this both wounds are on the medial aspect. She has reflux in the common femoral vein and popliteal vein and an accessory vein on the right and the common femoral vein and popliteal vein on the left. I'm going to have her go to see vein and vascular just the look over things and see if anything else beside aggressive compression is indicated here. We have not been able to make any progress on these wounds in spite of the fact that the surface of the wounds is never look too bad. 11/20/17; no major change in the condition of the wounds. Patient reports a large amount of drainage. She has a lot of complaints of pain although enlisting her today I wonder if some of this at least his neuropathic rather than secondary to her wounds. She has an appointment with vein and vascular on 12/30/17. The refractory nature of these wounds in my mind at least need vein and vascular to look over the wounds the recent reflux studies we did and her history to see if anything further can be done here. I also note her gait is deteriorated quite a bit. 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 She has a bilateral Trendelenburg gait. I wonder if this is neuropathic or perhaps multilevel radicular. 11/27/17; her wounds actually looks slightly better. Healthy-looking granulation tissue a scant amount of epithelialization. [place] healthcare will not pay for [employer]. They will play for tri layer Oasis and Dermagraft. This is not a diabetic ulcer. We'll try for the tri layer Oasis. She still complains of some drainage. She has a vein and vascular appointment on 12/30/17 12/04/17; the wounds visually look quite good. Healthy-looking granulation with some degree of epithelialization. We are still  waiting for response to our request for trial to try layer Oasis. Her appointment with vascular to review venous and arterial issues isn't sold the end of July 7/31. Not allow debridement or even vigorous cleansing of the wound surface. 12/18/17; slightly smaller especially on the right. 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 We've been using Hydrofera Blue. We still are looking into triple layer Oasis through her insurance 01/08/18 on evaluation today patient's wound actually appears to be showing signs of good improvement at this point in time. She has been tolerating the dressing changes without complication. Fortunately there does not appear to be any evidence of infection at this point in time. We have been utilizing silver nitrate which does seem to be of benefit for her which is also good news. Overall I'm very happy with how things seem to be both regards appearance as well as measurement. Patient did see Dr. Bridgett Larsson for evaluation on 12/30/17. In his assessment he felt that stripping would not likely add much more than chronic compression to the patient's healing process. His recommendation was to follow-up in three months with Dr. Doren Custard if she hasn't healed in order to consider referral back to you and see vascular where she previously was in a trial and was able to get her wound to heal. I'll be see what she feels she when you staying compression and he reiterated this as well. 01/13/18 on evaluation today patient appears to actually be doing very well in regard to her bilateral medial malleolus ulcers. She seems to have tolerated the chemical cauterization with silver nitrate last week she did have some pain through that evening but fortunately states that I'll be see since it seems to  be doing better she is overall pleased with the progress. 01/21/18; really quite a remarkable improvement since I've last seen these wounds. We started using silver  nitrate specially on the islands of hyper granulation which for some reason her around the wound circumference. This is really done quite nicely. Primary dressing Hydrofera Blue under 4 layer compression. She seems to be able to hold out without a nurse rewrap. Follow-up in 1 week 01/28/18; we've continued the hydrofera blue but continued with chemical cauterization to the wound area that we started about a month ago for irregular hyper granulation. She is made almost stunning improvement in the overall wound dimensions. I was not really expecting this degree of improvement in these chronic wounds 02/05/18; we continue with Hydrofera Bluebut of also continued the aggressive chemical cauterization with silver nitrate. We made nice progress with the right greater than left wound. 02/12/18. We continued with Hydrofera Blue after aggressive chemical cauterization with silver nitrate. We appear to be making nice progress with both wound areas 02/19/2018; we continue with [facility] after washing the wounds vigorously with Anasept spray and chemical cauterization with silver nitrate. We are making excellent progress. The area on the right's just about closed 02/26/2018. The area on the left medial ankle had too much necrotic debris today. I used a #5 curette we are able to get most of the soft. I continued with the silver nitrate to the much smaller wound on the right medial ankle she had a new area on her right lower pretibial area which she says was due to a role in her compression 03/05/2018; both wound areas look healthy. Not much change in dimensions from last week. I continue to use silver nitrate and Hydrofera Blue. The patient saw Dr. Doren Custard of vein and vascular. He felt she had venous stasis ulcers. He felt based on her previous arteriogram she should have adequate circulation for healing. Also she has deep venous reflux but really no significant correctable superficial venous reflux at this time.  He felt we should continue with conservative management including leg elevation and compression 04/02/2018; since we last saw this woman about a month ago she had a fall apparently suffered a pelvic fracture. I did not look up the x-ray. Nevertheless because of pain she literally was bedbound for 2 weeks and had home health coming out to change the dressing. Somewhat predictably this is resulted in considerable improvement in both wound areas. The right is just about closed on the medial malleolus and the left is about half the size. 04/16/2018; both her wounds continue to go down in size. Using Hydrofera Blue. 05/07/18; both her wounds appeared to be improving especially on the right where it is almost closed. We are using Hydrofera Blue 05/14/2018; slightly worse this week with larger wounds. Surface on the left medial not quite as good. We have been using Hydrofera Blue 05/21/18; again the wounds are slightly larger. Left medial malleolus slightly larger with eschar around the circumference. We have been using Hydrofera Blue undergoing a wraps for a prolonged period of time. This got a lot better when she was more recumbent due to a fall and a back injury. I change the primary dressing the silver alginate today. She did not tolerate a 4 layer compression previously although I may need to bring this up with her next time 05/28/2018; area on the left medial malleolus again is slightly larger with more drainage. Area on the right is roughly unchanged. She has a small area  of folliculitis on the right medial just on the lower calf. This does not look ominous. 06/03/2018 left medial malleolus slightly smaller in a better looking surface. We used silver nitrate on this last time with silver alginate. The area on the right appears slightly smaller 1/10; left medial malleolus slightly smaller. Small open area on the right. We used silver nitrate and silver alginate as of 2 weeks ago. We continue with  the wound and compression. These got a lot better when she was off her feet 1/17; right medial malleolus wound is smaller. The left may be slightly smaller. Both surfaces look somewhat better. 1/24; both wounds are slightly smaller. Using silver alginate under Unna boots 1/31; both wounds appear smaller in fact the area on the right medial is just about closed. Surface eschar. We have been using silver alginate under Unna boots. The patient is less active now spends let much less time on her feet and I think this is contributed to the general improvement in the wound condition 2/7; both wounds appear smaller. I was hopeful the right medial would be closed however there there is still the same small open area. Slight amount of surface eschar on the left the dimensions are smaller there is eschar but the wound edges appear to be free. We have been using silver alginate under Unna boot's 2/14; both wounds once again measure smaller. Circumferential eschar on the left medial. We have been using silver alginate under Unna boots with gradual improvement 2/21; the area on the right medial malleolus has healed. The area on the left is smaller. We have been using silver alginate and Unna boots. We can discharge wrapping the right leg she has 20/30 stockings at home she will need to protect the scar tissue in this area 2/28; the area on the right medial malleolus remains closed the patient has a compression stocking. The area on the left is smaller. We have been using silver alginate and Unna boots. 3/6 the area on the right medial ankle remains closed. Good edema control noted she is using her own compression stocking. The area on the left medial ankle is smaller. We have been managing this with silver alginate and Unna boots which we will continue today. 3/13; the area on the right medial ankle remains closed and I'm declaring it healed today. When necessary the left is about the same still a  healthy-looking surface but no major change and wound area. No evidence of infection and using silver alginate under unna and generally making considerable improvement 3/27 the area on the right medial ankle remains closed the area on the left is about the same as last week. Certainly not any worse we have been using silver alginate under an Unna boot 4/3; the area on the right medial ankle remains closed per the patient. We did not look at this wound. The wound on the left medial ankle is about the same surface looks healthy we have been using silver alginate under an Unna boot 4/10; area on the right medial ankle remains closed per the patient. We did not look at this wound. The wound on the left medial ankle is slightly larger. The patient complains that the [facility] caused burning pain all week. She also told us that she was a lot more active this week. Changed her back to silver alginate 4/17; right medial ankle still closed per the patient. Left medial ankle is slightly larger. Using silver alginate. She did not tolerate Hydrofera Blue on  this area 4/24; right medial ankle remains closed we have not look at this. The left medial ankle continues to get larger today by about a centimeter. We have been using silver alginate under Unna boots. She complains about 4 layer compression as an alternative. She has been up on her feet working on her garden 5/8; right medial ankle remains closed we did not look at this. The left medial ankle has increased in size about 100%. We have been using silver alginate under Unna boots. She noted increased pain this week and was not surprised that the wound is deteriorated 5/15; no major change in SA however much less erythema ( one week of doxy ocellulitis). 5/22-63 year old female returns at 1 week to the clinic for left medial ankle wound for which we have been using silver alginate under 3 layer compression She was placed on DOXY at last visit - the  wound is wider at this visit. She is in 3 layer compression 5/29; change to [facility] last week. I had given her empiric doxycycline 2 weeks ago for a week. She is in 3 layer compression. She complains of a lot of pain and drainage on presentation today. 6/5; using Hydrofera Blue. I gave her doxycycline recently empirically for erythema and pain around the wound. Believe her cultures showed enterococcus which not would not have been well covered by doxycycline nevertheless the wound looks better and I don't feel specifically that the enterococcus needs to be covered. She has a new what looks like a wrap injury on her lateral left ankle. 6/12; she is using Hydrofera Blue. She has a new area on the left anterior lower tibial area. This was a wrap injury last week. 6/19; the patient is using Hydrofera Blue. She arrived with marked inflammation and erythema around the wound and tenderness. 12/01/18 on evaluation today patient appears to be doing a little bit better based on what I'm hearing from the standpoint of lassos evaluation to this as far as the overall appearance of the wound is concerned. Then sometime substandard she typically sees Dr. Dellia Nims. Nonetheless overall very pleased with the progress that she's made up to this point. No fevers, chills, nausea, or vomiting noted at this time. 7/10; some improvement in the surface area. Aggressively debrided last week apparently. I went ahead with the debridement today although the patient does not tolerate this very well. We have been using Iodoflex. Still a fair amount of drainage 7/17; slightly smaller. Using Iodoflex. 7/24; no change from last week in terms of surface area. We have been using Iodoflex. Surface looks and continues to look somewhat better 7/31; surface area slightly smaller better looking surface. We have been using Iodoflex. This is under Unna boot compression 8/7-Patient presents at 1 week with Unna boot and Iodoflex,  wound appears better 8/14-Patient presents at 1 week with Iodoflex, we use the Unna boot, wound appears to be stable better.Patient is getting Botox treatment for the inversion of the foot for tendon release, Next week 8/21; we are using Iodoflex. Unna boot. The wound is stable in terms of surface area. Under illumination there is some areas of the wound that appear to be either epithelialized or perhaps this is adherent slough at this point I was not really clear. It did not wipe off and I was reluctant to debride this today. 8/28; we are using Iodoflex in an Unna boot. Seems to be making good improvement. 9/4; using Iodoflex and wound is slightly smaller. 9/18; we are using  Iodoflex with topical silver nitrate when she is here. The wound continues to be smaller 10/2; patient missed her appointment last week due to GI issues. She left and Iodoflex based dressing on for 2 weeks. Wound is about the same size about the size of a dime on the left medial lower 10/9 we have been using Iodoflex on the medial left ankle wound. She has a new superficial probable wrap injury on the dorsal left ankle 10/16; we have been using Hydrofera Blue since last week. This is on the left medial ankle 10/23; we have been using Hydrofera Blue since 2 weeks ago. This is on the left medial ankle. Dimensions are better 11/6; using Hydrofera Blue. I think the wound is smaller but still not closed. Left medial ankle 11/13; we have been using Hydrofera Blue. Wound is certainly no smaller this week. Also the surface not as good. This is the remanent of a very large area on her left medial ankle. Electronic Signature(s) Signed: 04/15/2019 5:33:43 PM By: Linton Ham MD Entered By: Linton Ham on 04/15/2019 11:07:25 -------------------------------------------------------------------------------- Physical Exam Details Patient Name: Date of Service: AREEJ, FLOOK 04/15/2019 10:00 AM Medical Record  [id number] Patient Account Number: [id number] Date of Birth/Sex: Treating RN: Oct 02, 1955 (63 y.o. [name] Primary Care Provider: [name] Other Clinician: Referring Provider: Treating Provider/Extender:Robson, Tawny Asal in Treatment: 331 Constitutional Sitting or standing Blood Pressure is within target range for patient.. Pulse regular and within target range for patient.Marland Kitchen Respirations regular, non-labored and within target range.. Temperature is normal and within the target range for the patient.Marland Kitchen Appears in no distress. Notes Wound exam; left medial malleolus. No improvement in surface area. Under illumination the wound bed appeared to have debris over the surface using a #3 curette fibrinous adherent debris removed from the wound surface. Electronic Signature(s) Signed: 04/15/2019 5:33:43 PM By: Linton Ham MD Entered By: Linton Ham on 04/15/2019 11:09:03 -------------------------------------------------------------------------------- Physician Orders Details Patient Name: Date of Service: [name] 04/15/2019 10:00 AM Medical Record [id number] Patient Account Number: [id number] Date of Birth/Sex: Treating RN: Aug 04, 1955 (62 y.o. [name] Primary Care Provider: [name] Other Clinician: Referring Provider: Treating Provider/Extender:Robson, Tawny Asal in Treatment: [phone] Verbal / Phone Orders: No Diagnosis Coding Follow-up Appointments Return Appointment in 1 week. Dressing Change Frequency Wound #3 Left,Medial Malleolus Do not change entire dressing for one week. Skin Barriers/Peri-Wound Care Moisturizing lotion TCA Cream or Ointment - mixed with lotion Wound Cleansing Clean wound with Wound Cleanser - clean with anasept with dressing changes May shower with protection. Primary Wound Dressing Wound #3 Left,Medial Malleolus Other: - Sorbact. Apply foam to shin for  protection Secondary Dressing Wound #3 Left,Medial Malleolus Dry Gauze Kerramax Edema Control Unna Boot to Left Lower Extremity - no kerlix layer Avoid standing for long periods of time Elevate legs to the level of the heart or above for 30 minutes daily and/or when sitting, a frequency of: - throughout the day Support Garment 20-30 mm/Hg pressure to: - compression stocking right leg daily Electronic Signature(s) Signed: 04/15/2019 5:20:59 PM By: Kela Millin Signed: 04/15/2019 5:33:43 PM By: Linton Ham MD Entered By: Kela Millin on 04/15/2019 10:52:37 -------------------------------------------------------------------------------- Problem List Details Patient Name: Date of Service: [name] 04/15/2019 10:00 AM Medical Record [id number] Patient Account Number: [id number] Date of Birth/Sex: Treating RN: 12-01-1955 (63 y.o. 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 08/03/2014 08/03/2014 and inflammation L03.116 Cellulitis of left lower limb 09/07/2014 09/07/2014 L97.321 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 [name] Primary Care Provider: [name] Other Clinician: Referring Provider: Treating Provider/Extender:Robson, Tawny Asal in Treatment: 331 Subjective History of Present Illness (HPI) the remaining wound is over the left medial ankle. Similar wound over the right medial ankle healed largely with use of Apligraf. Most recently we have been using Hydrofera Blue over this wound with considerable improvement. The patient has been extensively worked up in the past for her venous insufficiency and she is not a candidate for antireflux surgery although I have none of the details available currently. 08/24/14; considerable improvement today. About 50% of this wound areas now epithelialized. The base of the wound appears to be healthier granulation.as opposed to last week when she had deteriorated a considerable improvement 08/17/14; unfortunately the wound has regressed somewhat. The areas of epithelialization from the superior aspect are not nearly as healthy as they were last week. The patient thinks her Hydrofera Blue slipped. 09/07/14; unfortunately the area has markedly regressed in the 2 weeks since I've seen this. There is an odor surrounding erythema. The healthy granulation tissue that we had at the base of the wound now is a dusky color. The nurse reports green drainage 09/14/14; the area looks somewhat better than last week. There is less erythema and less drainage. The culture I did did not show any growth. Nevertheless I think it is better to continue the Cipro and doxycycline for a further week. The remaining  wound area was debridement. 09/21/14. Wound did not require debridement last week. Still less erythema and less drainage. She can complete her antibiotics. The areas of epithelialization in the superior aspect of the wound do not look as healthy as they did some weeks ago 10/05/14 continued improvement in the condition of this wound. There is advancing epithelialization. Less aggressive debridement required 10/19/14 continued improvement in the condition and volume of this wound. Less aggressive debridement to the inferior part of this to remove surface slough and fibrinous eschar 11/02/14 no debridement is required. The surface granulation appears healthy although some of her islands of epithelialization seem to have regressed. No evidence of infection 11/16/14; lites surface debridement done of surface eschar. The wound does not look to be unhealthy. No evidence of infection. Unfortunately the patient has had podiatry issues in the right foot and for some reason has redeveloped small surface ulcerations in the medial right ankle. Her original presentation involved wounds in this area 11/23/14 no debridement. The area on the right ankle has enlarged. The left ankle wound appears stable in terms of the surface although there is periwound inflammation. There has  been regression in the amount of new skin 11/30/14 no debridement. Both wound areas appear healthy. There was no evidence of infection. The the new area on the right medial ankle has enlarged although that both the surfaces appear to be stable. 12/07/14; Debridement of the right medial ankle wound. No no debridement was done on the left. 12/14/14 no major change in and now bilateral medial ankle wounds. Both of these are very painful but the no overt evidence of infection. She has had previous venous ablation 12/21/14; patient states that her right medial ankle wound is considerably more painful last week than usual. Her left is also somewhat  painful. She could not tolerate debridement. The right medial ankle wound has fibrinous surface eschar 12/28/14 this is a patient with severe bilateral venous insufficiency ulcers. For a considerable period of time we actually had the one on the right medial ankle healed however this recently opened up again in June. The left medial ankle wound has been a refractory area with some absent flows. We had some success with Hydrofera Blue on this area and it literally closed by 50% however it is recently opened up Foley. Both of these were debridement today of surface eschar. She tolerates this poorly 01/25/15: No change in the status of this. Thick adherent escar. Very poor tolerance of any attempt at debridement. I had healed the right medial malleolus wound for a considerable amount of time and had the left one down to about 50% of the volume although this is totally regressed over the last 48 weeks. Further the right leg has reopened. she is trying to make a appointment with pain and vascular, previous ablations with Dr. Aleda Grana. I do not believe there is an arterial insufficiency issue here 02/01/15 the status of the adherent eschar bilaterally is actually improved. No debridement was done. She did not manage to get vascular studies done 02/08/15 continued debridement of the area was done today. The slough is less adherent and comes off with less pressure. There is no surrounding infection peripheral pulses are intact 02/15/15 selective debridement with a disposable curette. Again the slough is less adherent and comes off with less difficulty. No surrounding infection peripheral pulses are intact. 02/22/15 selective debridement of the right medial ankle wound. Slough comes off with less difficulty. No obvious surrounding infection peripheral pulses are intact I did not debridement the one on the left. Both of these are stable to improved 03/01/15 selective debridement of both wound areas using a  curette to. Adherent slough cup soft with less difficulty. No obvious surrounding infection. The patient tells me that 2 days ago she noted a rash above the right leg wrap. She did not have this on her lower legs when she change this over she arrives with widespread left greater than right almost folliculitis-looking rash which is extremely pruritic. I don't see anything to culture here. There is no rash on the rest of her body. She feels well systemically. 03/08/15; selective debridement of both wounds using a curette. Base of this does not look unhealthy. She had limegreen drainage coming out of the left leg wound and describes a lot of drainage. The rash on her left leg looks improved to. No cultures were done. 03/22/15; patient was not here last week. Basal wounds does not look healthy and there is no surrounding erythema. No drainage. There is still a rash on the left leg that almost looks vasculitic however it is clearly limited to the top of where the wrap  would be. 04/05/15; on the right required a surgical debridement of surface eschar and necrotic subcutaneous tissue. I did not debridement the area on the left. These continue to be large open wounds that are not changing that much. We were successful at one point in healing the area on the right, and at the same time the area on the left was roughly half the size of current measurements. I think a lot of the deterioration has to do with the prolonged time the patient is on her feet at work 04/19/15 I attempted-like surface debridement bilaterally she does not tolerate this. She tells me that she was in allergic care yesterday with extreme pain over her left lateral malleolus/ankle and was told that she has an "sprain" 05/03/15; large bilateral venous insufficiency wounds over the medial malleolus/medial aspect of her ankles. She complains of copious amounts of drainage and his usual large amounts of pain. There is some increasing  erythema around the wound on the right extending into the medial aspect of her foot to. historically she came in with these wounds the right one healed and the left one came down to roughly half its current size however the right one is reopened and the left is expanded. 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 05/10/15 large bilateral venous insufficiency wounds. There is less adherence surface left however the surface culture that I did last week grew pseudomonas therefore bilateral selective debridement score necessary. There is surrounding erythema. The patient describes severe bilateral drainage and a lot of pain in the left ankle. Apparently her podiatrist was were ready to do a cortisone shot 05/17/15; the patient complains of pain and again copious amounts of drainage. 05/24/15; we used Iodo flex last week. Patient notes considerable improvement in wound drainage. Only needed to change this once. 05/31/15; we continued Iodoflex; the base of these large wounds bilaterally is not too bad but there is probably likely a significant bioburden here. I would like to debridement just doesn't tolerate it. 06/06/14 I would like to continue the Iodoflex although she still hasn't managed to obtain supplies. She has bilateral medial malleoli or large wounds which are mostly superficial. Both of them are covered circumferentially with some nonviable fibrinous slough although she tolerates debridement very poorly. She apparently has an appointment for an ablation on the right leg by interventional radiology. 06/14/15; the patient arrives with the wounds and static condition. We attempted a debridement although she does not do well with this secondary to pain. I 07/05/15; wounds are not much smaller however there appears to be a cleaner granulating base. The left has tight fibrinous slough greater than the right. Debridement is tolerated poorly due to pain.  Iodoflex is done more for these wounds in any of the multitude of different dressings I have tried on the left 1 and then subsequently the right. 07/12/15; no change in the condition of this wound. I am able to do an aggressive debridement on the right but not the left. She simply cannot tolerate it. We have been using Iodoflex which helps somewhat. It is worthwhile remembering that at one point we healed the right medial ankle wound and the left was about 25% of the current circumference. We have suggested returning to vascular surgery for review of possible further ablations for one reason or another she has not been able to do this. 07/26/15 no major change in the condition of either  wound on her medial ankle. I did not attempt to debridement of these. She has been aggressively scrubbing these while she is in the shower at home. She has her supply of Iodoflex which seems to have done more for these wounds then anything I have put on recently. 08/09/15 wound area appears larger although not verified by measurements. Using Iodoflex 09/05/2015 -- she was here for avisit today but had significant problems with the wound and I was asked to see her for a physician opinion. I have summarize that this lady has had surgery on her left lower extremity about 10 years ago where the possible veins stripping was done. She has had an opinion from interventional radiology around November 2016 where no further sclerotherapy was ordered. The patient works 12 hours a day and stands on a concrete floor with work boots and is unable to get the proper compression she requires and cannot elevate her limbs appropriately at any given time. She has recently grown Pseudomonas from her wound culture but has not started her ciprofloxacin which was called in for her. 09/13/15 this continues to be a difficult situation for this patient. At one point I had this wound down to a 1.5 x 1.5" wound on her left leg. This is deteriorated  and the right leg has reopened. She now has substantial wounds on her medial calcaneus, malleoli and into her lower leg. One on the left has surface eschar but these are far too painful for me to debridement here. She has a vascular surgery appointment next week to see if anything can be done to help here. I think she has had previous ablations several years ago at [facility]. She has no major edema. She tells me that she did not get product last time [facility] and went for several days without it. She continues to work in work boots 12 hours a day. She cannot get compression/4-layer under her work boots. 09/20/15 no major change. Periwound edema control was not very good. Her point with pain and vascular is next Wednesday the 25th 09/28/15; the patient is seen vascular surgery and is apparently scheduled for repeat duplex ultrasounds of her bilateral lower legs next week. 10/05/15; the patient was seen by Dr. Doren Custard of vascular surgery. He feels that she should have arterial insufficiency excluded as cause/contributed to her nonhealing stage she is therefore booked for an arteriogram. She has apparently monophasic signals in the dorsalis pedis pulses. She also of course has known severe chronic venous insufficiency with previous procedures as noted previously. I had another long discussion with the patient today about her continuing to work 12 hour shifts. I've written her out for 2 months area had concerns about this as her work location is currently undergoing significant turmoil and this may lead to her termination. She is aware of this however I agree with her that she simply cannot continue to stand for 12 hours multiple days a week with the substantial wound areas she has. 10/19/15; the Dr. Doren Custard appointment was largely for an arteriogram which was normal. She does not have an arterial issue. He didn't make a comment about her chronic venous insufficiency for which she has had previous  ablations. Presumably it was not felt that anything additional could be done. The patient is now out of work as I prescribed 2 weeks ago. Her wounds look somewhat less aggravated presumably because of this. I felt I would give debridement another try today 10/25/15; no major change in this patient's  wounds. We are struggling to get her product that she can afford into her own home through her insurance. 11/01/15; no major change in the patient's wounds. I have been using silver alginate as the most affordable product. I spoke to Dr. Marla Roe last week with her requested take her to the OR for surgical debridement and placement of ACEL. Dr. Marla Roe told me that she would be willing to do this however [facility] will not cover this, fortunately the patient has [place] [facility] of some variant 11/08/15; no major change in the patient's wounds. She has been completely nonviable surface that this but is in too much pain with any attempted debridement are clinic. I have arranged for her to see Dr. Marla Roe ham of plastic surgery and this appointment is on Monday. I am hopeful that they will take her to the OR for debridement, possible ACEL ultimately possible skin graft 11/22/15 no major change in the patient's wounds over her bilateral medial calcaneus medial malleolus into the lower legs. Surface on these does not look too bad however on the left there is surrounding erythema and tenderness. This may be cellulitis or could him sleepy tinea. 11/29/15; no major changes in the patient's wounds over her bilateral medial malleolus. There is no infection here and I don't think any additional antibiotics are necessary. There is now plan to move forward. She sees Dr. Marla Roe in a week's time for preparation for operative debridement and ACEL placement I believe on 7/12. She then has a follow-up appointment with Dr. Marla Roe on 7/21 12/28/15; the patient returns today having been taken to  the [place] by Dr. Marla Roe 12/12/15 she underwent debridement, intraoperative cultures [which were negative]. She had placement of a wound VAC. Parent really ACEL was not available to be placed. The wound VAC foam apparently adhered to the wound since then she's been using silver alginate, Xeroform under Ace wraps. She still says there is a lot of drainage and a lot of pain 01/31/16; this is a patient I see monthly. I had referred her to Dr. Marla Roe him of plastic surgery for large wounds on her bilateral medial ankles. She has been to the OR twice once in early July and once in early August. She tells me over the last 3 weeks she has been using the wound VAC with ACEL underneath it. On the right we've simply been using silver alginate. Under Kerlix Coban wraps. 02/28/16; this is a patient I'm currently seeing monthly. She is gone on to have a skin graft over her large venous insufficiency ulcer on the left medial ankle. This was done by Dr. Marla Roe him. The patient is a bit perturbed about why she didn't have one on her right medial ankle wound. She has been using silver alginate to this. 03/06/16; I received a phone call from her plastic surgery Dr. Marla Roe. She expressed some concern about the viability of the skin graft she did on the left medial ankle wound. Asked me to place Endoform on this. She told me she is not planning to do a subsequent skin graft on the right as the left one did not take very well. I had placed Hydrofera Blue on the right 03/13/16; continue to have a reasonably healthy wound on the right medial ankle. Down to 3 mm in terms of size. There is epithelialization here. The area on the left medial ankle is her skin graft site. I suppose the last week this looks somewhat better. She has an open area  inferiorly however in the center there appears to be some viable tissue. There is a lot of surface callus and eschar that will eventually need to come off however none of this  looked to be infected. Patient states that the is able to keep the dressing on for several days which is an improvement. 03/20/16 no major change in the circumference of either wound however on the left side the patient was at Dr. Eusebio Friendly office and they did a debridement of left wound. 50% of the wound seems to be epithelialized. I been using Endoform on the left Hydrofera Blue in the right 03/27/16; she arrives today with her wound is not looking as healthy as they did last week. The area on the right clearly has an adherent surface to this a very similar surface on the left. Unfortunately for this patient this is all too familiar problem. Clearly the Endoform is not working and will need to change that today that has some potential to help this surface. She does not tolerate debridement in this clinic very well. She is changing the dressing wants 04/03/16; patient arrives with the wounds looking somewhat better especially on the right. Dr. Migdalia Dk change the dressing to silver alginate when she saw her on Monday and also sold her some compression socks. The usefulness of the latter is really not clear and woman with severely draining wounds. 04/10/16; the patient is doing a bit of an experiment wearing the compression stockings that Dr. Migdalia Dk provided her to her left leg and the out of legs based dressings that we provided to the right. 05/01/16; the patient is continuing to wear compression stockings Dr. Migdalia Dk provided her on the left that are apparently silver impregnated. She has been using Iodoflex to the right leg wound. Still a moderate amount of drainage, when she leaves here the wraps only last for 4 days. She has to change the stocking on the left leg every night 05/15/16; she is now using compression stockings bilaterally provided by Dr. Marla Roe. She is wearing a nonadherent layer over the wounds so really I don't think there is anything specific being done to this now. She  has some reduction on the left wound. The right is stable. I think all healing here is being done without a specific dressing 06/09/16; patient arrives here today with not much change in the wound certainly in diameter to large circular wounds over the medial aspect of her ankle bilaterally. Under the light of these services are certainly not viable for healing. There is no evidence of surrounding infection. She is wearing compression stockings with some sort of silver impregnation as prescribed by Dr. Marla Roe. She has a follow-up with her tomorrow. 06/30/16; no major change in the size or condition of her wounds. These are still probably covered with a nonviable surface. She is using only her purchase stockings. She did see Dr. Marla Roe who seemed to want to apply Dakin's solution to this I'm not extreme short what value this would be. I would suggest Iodoflex which she still has at home. 07/28/16; I follow Mrs. Burgi episodically along with Dr. Marla Roe. 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 She has been applying a topical collagen ointment to both wounds with Adaptic. I don't think Dr. Marla Roe is planning to take her back to the OR. 08/19/16; I follow Mrs. Jeneen Rinks on a monthly basis along with Dr. Marla Roe of plastic surgery. She has very refractory venous insufficiency wounds  on the bilateral medial lower legs left greater than right. I been following her for a number of years. At one point I was able to get the right medial malleolus wound to heal and had the left medial malleolus down to about half its current size however and I had to send her to plastic surgery for an operative debridement. Since then things have been stable to slightly improve the area on the right is slightly better one in the left about the same although there is much less adherent surface than I'm used to with this patient. She is using some form  of liquid collagen gel that Dr. Marla Roe provided a Kerlix cover with the patient's own pressure stockings. She tells me that she has extreme pain in both ankles and along the lateral aspect of both feet. She has been unable to work for some period of time. She is telling me she is retiring at the beginning of April. She sees Dr. Doran Durand of orthopedics next week 09/22/16; patient has not seen Dr. Marla Roe since the last time she is here. I'm not really sure what she is using to the wounds other than bits and pieces of think she had left over including most recently Hydrofera Blue. She is using juxtalite stockings. She is having difficulty with her husband's recent illness "stroke". She is having to transport him to various doctors appointments. Dr. Marla Roe left her the option of a repeat debridement with ACEL however she has not been able to get the time to follow-up on this. She continues to have a fair amount of drainage out of these wounds with certainly precludes leaving dressings on all week 10/13/16; patient has not seen Dr. Marla Roe since she was last in our clinic. I'm not really sure what she is doing with the wounds, we did try to get her [facility] and I think she is actually using this most of the time. Because of drainage she states she has to change this every second day although this is an improvement from what she used to do. She went to see Dr. Doran Durand who did not think she had a muscular issue with regards to her feet, he referred her to a neurologist and I think the appointment is sometime in June. I changed her back to Iodoflex which she has used in the past but not recently. 11/03/16; the patient has been using Iodoflex although she ran out of this. Still claims that there is a lot of drainage although the wound does not look like this. No surrounding erythema. She has not been back to see Dr. Marla Roe 11/24/16; the patient has been using Iodoflex again but she ran out  of it 2 or 3 days ago. There is no major change in the condition of either one of these wounds in fact they are larger and covered in a thick adherent surface slough/nonviable tissue especially on the left. She does not tolerate mechanical debridement in our clinic. Going back to see Dr. Marla Roe of plastic surgery for an operative debridement would seem reasonable. 12/15/16; the patient has not been back to see Dr. Marla Roe. She is been dealing with a series of illnesses and her husband which of monopolized her time. She is been using Sorbact which we largely supplied. She states the drainage is bad enough that it maximum she can go 2-3 days without changing the dressing 01/12/2017 -- the patient has not been back for about 4 weeks and has not seen Dr. Marla Roe not does  she have any appointment pending. 01/23/17; patient has not seen Dr. Marla Roe even though I suggested this previously. She is using Santyl that was suggested last week by Dr. Con Memos this Cost her $16 through her insurance which is indeed surprising 02/12/17; continuing Santyl and the patient is changing this daily. A lot of drainage. She has not been back to see plastic surgery she is using an Ace wrap. Our intake nurse suggested wrap around stockings which would make a good reasonable alternative 02/26/17; patient is been using Santyl and changing this daily due to drainage. She has not been to see plastic surgery she uses in April Ace wrap to control the edema. She did obtain extremitease stockings but stated that the edema in her leg was to big for these 03/20/17; patient is using Santyl and Anasept. Surfaces looked better today the area on the right is actually measuring a little smaller. She has states she has a lot of pain in her feet and ankles and is asking for a consult to pain control which I'll try to help her with through our case manager. 04/10/17; the patient arrives with better-looking wound surfaces and is  slightly smaller wound on the left she is using a combination of Santyl and Anasept. She has an appointment or at least as started in the pain control center associated with [facility] 05/14/17; this is a patient who I followed for a prolonged period of time. 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 At one point I had this down to a much smaller wound on the left however these reopened and we've never been able to get these to heal. She has been using Santyl and Anasept gel although 2 weeks ago she ran out of the Anasept gel. She has a stable appearance of the wound. She is going to the wound care clinic at [name]. They wanted do a nerve block/spinal block although she tells me she is reluctant to go forward with that. 05/21/17; this is a patient I have followed for many years. 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 Chronic pain and deformity in her ankles as well. She is been to see plastic surgery as well as orthopedics. Using PolyMem AG most recently/Kerramax/ABDs and 2 layer compression. She has managed to keep this on and she is coming in for a nurse check to change the dressing on Tuesdays, we see her on Fridays 06/05/17; really quite a good looking surface and the area especially on the right medial has contracted in terms of dimensions. Well granulated healthy-looking tissue on both sides. Even with an open curet there is nothing that even feels abnormal here. This is as good as I've seen this in quite some time. We have been using PolyMem AG and bringing her in for a nurse check 06/12/17; really quite good surface on both of these wounds. The right medial has contracted a bit left is not. We've been using PolyMem and AG and she is coming in for a nurse visit 06/19/17; we have been using PolyMem AG and bringing her in for a nurse check. Dimensions of her wounds are not better but the surfaces looked better bilaterally.  She complained of bleeding last night and the left wound and increasing pain bilaterally. She states her wound pain is more neuropathic than just the wounds. There was some suggestion that this was radicular from her pain management doctor in talking to her it is really difficult to sort this  out. 06/26/17; using PolyMem and AG and bringing her in for a nurse check as All of this and reasonably stable condition. Certainly not improved. The dimensions on the lateral part of the right leg look better but not really measuring better. The medial aspect on the left is about the same. 07/03/16; we have been using PolyMen AG and bringing her in for a nurse check to change the dressings as the wounds have drainage which precludes once weekly changing. We are using all secondary absorptive dressings.our intake nurse is brought up the idea of using a wound VAC/snap VAC on the wound to help with the drainage to see if this would result in some contraction. This is not a bad idea. The area on the right medial is actually looking smaller. Both wounds have a reasonable-looking surface. There is no evidence of cellulitis. The edema is well controlled 07/10/17; the patient was denied for a snap VAC by her insurance. The major issue with these wounds continues to be drainage. We are using wicked PolyMem AG and she is coming in for a nurse visit to change this. The wounds are stable to slightly improved. The surface looks vibrant and the area on the right certainly has shrunk in size but very slowly 07/17/17; the patient still has large wounds on her bilateral medial malleoli. Surface of both of these wounds looks better. The dimensions seem to come and go but no consistent improvement. There is no epithelialization. We do not have options for advanced treatment products due to insurance issues. They did not approve of the wound VAC to help control the drainage. More recently we've been using PolyMem and AG wicked to  allow drainage through. We have been bringing her in for a nurse visit to change this. We do not have a lot of options for wound care products and the home again due to insurance issues 07/24/17; the patient's wound actually looks somewhat better today. No drainage measurements are smaller still healthy-looking surface. We used silver collagen under PolyMen started last week. We have been bringing her in for a dressing change 07/31/17; patient's wound surface continued to look better and I think there is visible change in the dimensions of the wound on the right. Rims of epithelialization. We have been using silver collagen under PolyMen and bringing her in for a dressing change. There appears to be less drainage although she is still in need of the dressing change 08/07/17. Patient's wound surface continues to look better on both sides and the area on the right is definitely smaller. We have been using silver collagen and PolyMen. She feels that the drainage has been it has been better. I asked her about her vascular status. She went to see Dr. Aleda Grana at [facility] and had some form of ablation. I don't have much detail on this. I haven't my notes from 2016 that she was not a candidate for any further ablation but I don't have any more information on this. We had referred her to vein and vascular I don't think she ever went. He does not have a history of PAD although I don't have any information on this either. We don't even have ABIs in our record 08/14/17; we've been using silver collagen and PolyMen cover. And putting the patient and compression. She we are bringing her in as a nurse visit to change this because ofarge amount of drainage. We didn't the ABIs in clinic today since they had been done in many moons 1.2  bilaterally. She has been to see vein and vascular however this was at [facility] and she had ablation although I really don't have any information on this all seemed biking  get a report. She is also been operatively debrided by plastic surgery and had a cell placed probably 8-12 months ago. This didn't have a major effect. We've been making some gains with current dressings 08/19/17-She is here in follow-up evaluation for bilateral medial malleoli ulcers. She continues to tolerate debridement very poorly. We will continue with recently changed topical treatment; if no significant improvement may consider switching to Iodosorb/Iodoflex. 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 She has been using silver collagen and PolyMem. I believe she has been used and tried on Iodoflex before. 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 These have been very refractory over time. We have been using silver collagen and PolyMen. She is coming in once a week for a doctor's and once a week for nurse visits. We are actually making some progress 09/18/17; the patient's wounds are smaller especially on the right medial. She arrives today to upset to consider even washing these off with Anasept which I think is been part of the reason this is been closing. We've been using collagen covered in PolyMen otherwise. It is noted that she has a small area of folliculitis on the right medial calf that. As we are wrapping her legs I'll give her a short course of doxycycline to make sure this doesn't amount to anything. She is a long list of complaints today including imbalance, shortness of breath on exertion, inversion of her left ankle. With regards to the latter complaints she is been to see orthopedics and they offered her a tendon release surgery I believe but wanted her wounds to be closed first. I have recommended she go see her primary physician with  regards to everything else. 09/25/17; patient's wounds are about the same size. We have made some progress bilaterally although not in recent weeks. She will not allow me T wash these wounds with Anasept even if she is doing her cell. Wheeze we've been using collagen covered in PolyMen. Last week she had a small area of folliculitis this is now opened into a small wound. She completed 5 days of trimethoprim sulfamethoxazole 10/02/17; unfortunately the area on her left medial ankle is worse with a larger wound area towards the Achilles. The patient complains of a lot of pain. She will not allow debridement although visually I don't think there is anything to debridement in any case. We have been using silver collagen and PolyMen for several months now. Initially we are making some progress although I'm not really seeing that today. We will move back to [facility]. His admittedly this is a bit of a repeat however I'm hoping that his situation is different now. The patient tells me she had her leg on the left give out on her yesterday this is process some pain. 10/09/17; the patient is seen twice a week largely because of drainage issues coming out of the chronic medial bimalleolar wounds that are chronic. Last week the dimensions of the one on the left looks a little larger I changed her to [facility]. She comes in today with a history of terrible pain in  the bilateral wound areas. She will not allow debridement. She will not even allow a tissue culture. There is no surrounding erythema no no evidence of cellulitis. We have been putting her Kerlix Coban man. She will not allow more aggressive compression as there was a suggestion to put her in 3 layer wraps. 10/16/17; large wounds on her bilateral medial malleoli. These are chronic. Not much change from last week. The surface looks have healthy but absolutely no epithelialization. A lot of pain little less so of drainage. She will not allow  debridement or even washing these off in the vigorous fashion with Anasept. 10/23/17; large wounds on her bilateral malleoli which are chronic. Some improvement in terms of size perhaps on the right since last time I saw these. She states that after we increased the 3 layer compression there was some bleeding, when she came in for a nurse visit she did not want 3 layer compression put back on about our nurse managed to convince her. She has known chronic venous visit issues and I'm hoping to get her to tolerate the 3 layer compression. using Hydrofera Blue 10/30/17; absolutely no change in the condition of either wound although we've had some improvement in dimensions on the right.. Attempted to put her in 3 layer compression she didn't tolerated she is back in 2 layer compression. We've been using Hydrofera Blue We looked over her past records. She had venous reflux studies in November 2016. There was no evidence of deep venous reflux on the right. Superficial vein did not show the greater saphenous vein at think this is been previously ablated the small saphenous vein was within normal limits. The left deep venous system showed no DVT the vessels were positive for deep venous reflux in the posterior tibial veins at the ankle. The greater saphenous vein was surgically absent small saphenous vein was within normal limits. She went to vein and vascular at [facility]. I believe she had an ablation on the left greater saphenous vein. I'll update her reflux studies perhaps ever reviewed by vein and vascular. We've made absolutely no progress in these wounds. Will also try to read and TheraSkins through her insurance 11/06/17; W the patient apparently has a 2 week follow-up with vein and vascular I like him to review the whole issue with regards to her previous vascular workup by Dr. Aleda Grana. We've really made no progress on these wounds in many months. She arrives today with less viable looking  surface on the left medial ankle wound. This was apparently looking about the same on Tuesday when she was here for nurse visit. 11/13/17; deep tissue culture I did last time of the left lower leg showed multiple organisms without any predominating. In particular no Staphylococcus or group A strep were isolated. We sent her for venous reflux studies. She's had a previous left greater saphenous vein stripping and I think sclerotherapy of the right greater saphenous vein. She didn't really look at the lesser saphenous vein this both wounds are on the medial aspect. She has reflux in the common femoral vein and popliteal vein and an accessory vein on the right and the common femoral vein and popliteal vein on the left. I'm going to have her go to see vein and vascular just the look over things and see if anything else beside aggressive compression is indicated here. We have not been able to make any progress on these wounds in spite of the fact that the surface of the wounds is  never look too bad. 11/20/17; no major change in the condition of the wounds. Patient reports a large amount of drainage. She has a lot of complaints of pain although enlisting her today I wonder if some of this at least his neuropathic rather than secondary to her wounds. She has an appointment with vein and vascular on 12/30/17. The refractory nature of these wounds in my mind at least need vein and vascular to look over the wounds the recent reflux studies we did and her history to see if anything further can be done here. I also note her gait is deteriorated quite a bit. 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 She has a bilateral Trendelenburg gait. I wonder if this is neuropathic or perhaps multilevel radicular. 11/27/17; her wounds actually looks slightly better. Healthy-looking granulation tissue a scant amount of epithelialization. [place] [facility] will not pay for [employer]. They will play for tri layer  Oasis and Dermagraft. This is not a diabetic ulcer. We'll try for the tri layer Oasis. She still complains of some drainage. She has a vein and vascular appointment on 12/30/17 12/04/17; the wounds visually look quite good. Healthy-looking granulation with some degree of epithelialization. We are still waiting for response to our request for trial to try layer Oasis. Her appointment with vascular to review venous and arterial issues isn't sold the end of July 7/31. Not allow debridement or even vigorous cleansing of the wound surface. 12/18/17; slightly smaller especially on the right. 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 We've been using Hydrofera Blue. We still are looking into triple layer Oasis through her insurance 01/08/18 on evaluation today patient's wound actually appears to be showing signs of good improvement at this point in time. She has been tolerating the dressing changes without complication. Fortunately there does not appear to be any evidence of infection at this point in time. We have been utilizing silver nitrate which does seem to be of benefit for her which is also good news. Overall I'm very happy with how things seem to be both regards appearance as well as measurement. Patient did see Dr. Bridgett Larsson for evaluation on 12/30/17. In his assessment he felt that stripping would not likely add much more than chronic compression to the patient's healing process. His recommendation was to follow-up in three months with Dr. Doren Custard if she hasn't healed in order to consider referral back to you and see vascular where she previously was in a trial and was able to get her wound to heal. I'll be see what she feels she when you staying compression and he reiterated this as well. 01/13/18 on evaluation today patient appears to actually be doing very well in regard to her bilateral medial malleolus ulcers. She seems to have tolerated the chemical cauterization with  silver nitrate last week she did have some pain through that evening but fortunately states that I'll be see since it seems to be doing better she is overall pleased with the progress. 01/21/18; really quite a remarkable improvement since I've last seen these wounds. We started using silver nitrate specially on the islands of hyper granulation which for some reason her around the wound circumference. This is really done quite nicely. Primary dressing Hydrofera Blue under 4 layer compression. She seems to be able to hold out without a nurse rewrap. Follow-up in 1 week 01/28/18; we've continued the hydrofera blue but continued with chemical cauterization to the wound area that we started about a month  ago for irregular hyper granulation. She is made almost stunning improvement in the overall wound dimensions. I was not really expecting this degree of improvement in these chronic wounds 02/05/18; we continue with Hydrofera Bluebut of also continued the aggressive chemical cauterization with silver nitrate. We made nice progress with the right greater than left wound. 02/12/18. We continued with Hydrofera Blue after aggressive chemical cauterization with silver nitrate. We appear to be making nice progress with both wound areas 02/19/2018; we continue with [facility] after washing the wounds vigorously with Anasept spray and chemical cauterization with silver nitrate. We are making excellent progress. The area on the right's just about closed 02/26/2018. The area on the left medial ankle had too much necrotic debris today. I used a #5 curette we are able to get most of the soft. I continued with the silver nitrate to the much smaller wound on the right medial ankle she had a new area on her right lower pretibial area which she says was due to a role in her compression 03/05/2018; both wound areas look healthy. Not much change in dimensions from last week. I continue to use silver nitrate and Hydrofera  Blue. The patient saw Dr. Doren Custard of vein and vascular. He felt she had venous stasis ulcers. He felt based on her previous arteriogram she should have adequate circulation for healing. Also she has deep venous reflux but really no significant correctable superficial venous reflux at this time. He felt we should continue with conservative management including leg elevation and compression 04/02/2018; since we last saw this woman about a month ago she had a fall apparently suffered a pelvic fracture. I did not look up the x-ray. Nevertheless because of pain she literally was bedbound for 2 weeks and had home health coming out to change the dressing. Somewhat predictably this is resulted in considerable improvement in both wound areas. The right is just about closed on the medial malleolus and the left is about half the size. 04/16/2018; both her wounds continue to go down in size. Using Hydrofera Blue. 05/07/18; both her wounds appeared to be improving especially on the right where it is almost closed. We are using Hydrofera Blue 05/14/2018; slightly worse this week with larger wounds. Surface on the left medial not quite as good. We have been using Hydrofera Blue 05/21/18; again the wounds are slightly larger. Left medial malleolus slightly larger with eschar around the circumference. We have been using Hydrofera Blue undergoing a wraps for a prolonged period of time. This got a lot better when she was more recumbent due to a fall and a back injury. I change the primary dressing the silver alginate today. She did not tolerate a 4 layer compression previously although I may need to bring this up with her next time 05/28/2018; area on the left medial malleolus again is slightly larger with more drainage. Area on the right is roughly unchanged. She has a small area of folliculitis on the right medial just on the lower calf. This does not look ominous. 06/03/2018 left medial malleolus slightly smaller in  a better looking surface. We used silver nitrate on this last time with silver alginate. The area on the right appears slightly smaller 1/10; left medial malleolus slightly smaller. Small open area on the right. We used silver nitrate and silver alginate as of 2 weeks ago. We continue with the wound and compression. These got a lot better when she was off her feet 1/17; right medial malleolus wound is  smaller. The left may be slightly smaller. Both surfaces look somewhat better. 1/24; both wounds are slightly smaller. Using silver alginate under Unna boots 1/31; both wounds appear smaller in fact the area on the right medial is just about closed. Surface eschar. We have been using silver alginate under Unna boots. The patient is less active now spends let much less time on her feet and I think this is contributed to the general improvement in the wound condition 2/7; both wounds appear smaller. I was hopeful the right medial would be closed however there there is still the same small open area. Slight amount of surface eschar on the left the dimensions are smaller there is eschar but the wound edges appear to be free. We have been using silver alginate under Unna boot's 2/14; both wounds once again measure smaller. Circumferential eschar on the left medial. We have been using silver alginate under Unna boots with gradual improvement 2/21; the area on the right medial malleolus has healed. The area on the left is smaller. We have been using silver alginate and Unna boots. We can discharge wrapping the right leg she has 20/30 stockings at home she will need to protect the scar tissue in this area 2/28; the area on the right medial malleolus remains closed the patient has a compression stocking. The area on the left is smaller. We have been using silver alginate and Unna boots. 3/6 the area on the right medial ankle remains closed. Good edema control noted she is using her own compression stocking.  The area on the left medial ankle is smaller. We have been managing this with silver alginate and Unna boots which we will continue today. 3/13; the area on the right medial ankle remains closed and I'm declaring it healed today. When necessary the left is about the same still a healthy-looking surface but no major change and wound area. No evidence of infection and using silver alginate under unna and generally making considerable improvement 3/27 the area on the right medial ankle remains closed the area on the left is about the same as last week. Certainly not any worse we have been using silver alginate under an Unna boot 4/3; the area on the right medial ankle remains closed per the patient. We did not look at this wound. The wound on the left medial ankle is about the same surface looks healthy we have been using silver alginate under an Unna boot 4/10; area on the right medial ankle remains closed per the patient. We did not look at this wound. The wound on the left medial ankle is slightly larger. The patient complains that the [facility] caused burning pain all week. She also told us that she was a lot more active this week. Changed her back to silver alginate 4/17; right medial ankle still closed per the patient. Left medial ankle is slightly larger. Using silver alginate. She did not tolerate Hydrofera Blue on this area 4/24; right medial ankle remains closed we have not look at this. The left medial ankle continues to get larger today by about a centimeter. We have been using silver alginate under Unna boots. She complains about 4 layer compression as an alternative. She has been up on her feet working on her garden 5/8; right medial ankle remains closed we did not look at this. The left medial ankle has increased in size about 100%. We have been using silver alginate under Unna boots. She noted increased pain this week and was  not surprised that the wound is deteriorated 5/15; no  major change in SA however much less erythema ( one week of doxy ocellulitis). 5/22-63 year old female returns at 1 week to the clinic for left medial ankle wound for which we have been using silver alginate under 3 layer compression She was placed on DOXY at last visit - the wound is wider at this visit. She is in 3 layer compression 5/29; change to [facility] last week. I had given her empiric doxycycline 2 weeks ago for a week. She is in 3 layer compression. She complains of a lot of pain and drainage on presentation today. 6/5; using Hydrofera Blue. I gave her doxycycline recently empirically for erythema and pain around the wound. Believe her cultures showed enterococcus which not would not have been well covered by doxycycline nevertheless the wound looks better and I don't feel specifically that the enterococcus needs to be covered. She has a new what looks like a wrap injury on her lateral left ankle. 6/12; she is using Hydrofera Blue. She has a new area on the left anterior lower tibial area. This was a wrap injury last week. 6/19; the patient is using Hydrofera Blue. She arrived with marked inflammation and erythema around the wound and tenderness. 12/01/18 on evaluation today patient appears to be doing a little bit better based on what I'm hearing from the standpoint of lassos evaluation to this as far as the overall appearance of the wound is concerned. Then sometime substandard she typically sees Dr. Dellia Nims. Nonetheless overall very pleased with the progress that she's made up to this point. No fevers, chills, nausea, or vomiting noted at this time. 7/10; some improvement in the surface area. Aggressively debrided last week apparently. I went ahead with the debridement today although the patient does not tolerate this very well. We have been using Iodoflex. Still a fair amount of drainage 7/17; slightly smaller. Using Iodoflex. 7/24; no change from last week in terms of surface  area. We have been using Iodoflex. Surface looks and continues to look somewhat better 7/31; surface area slightly smaller better looking surface. We have been using Iodoflex. This is under Unna boot compression 8/7-Patient presents at 1 week with Unna boot and Iodoflex, wound appears better 8/14-Patient presents at 1 week with Iodoflex, we use the Unna boot, wound appears to be stable better.Patient is getting Botox treatment for the inversion of the foot for tendon release, Next week 8/21; we are using Iodoflex. Unna boot. The wound is stable in terms of surface area. Under illumination there is some areas of the wound that appear to be either epithelialized or perhaps this is adherent slough at this point I was not really clear. It did not wipe off and I was reluctant to debride this today. 8/28; we are using Iodoflex in an Unna boot. Seems to be making good improvement. 9/4; using Iodoflex and wound is slightly smaller. 9/18; we are using Iodoflex with topical silver nitrate when she is here. The wound continues to be smaller 10/2; patient missed her appointment last week due to GI issues. She left and Iodoflex based dressing on for 2 weeks. Wound is about the same size about the size of a dime on the left medial lower 10/9 we have been using Iodoflex on the medial left ankle wound. She has a new superficial probable wrap injury on the dorsal left ankle 10/16; we have been using Hydrofera Blue since last week. This is on the left medial ankle  10/23; we have been using Hydrofera Blue since 2 weeks ago. This is on the left medial ankle. Dimensions are better 11/6; using Hydrofera Blue. I think the wound is smaller but still not closed. Left medial ankle 11/13; we have been using Hydrofera Blue. Wound is certainly no smaller this week. Also the surface not as good. This is the remanent of a very large area on her left medial ankle. Objective Constitutional Sitting or standing Blood Pressure  is within target range for patient.. Pulse regular and within target range for patient.Marland Kitchen Respirations regular, non-labored and within target range.. Temperature is normal and within the target range for the patient.Marland Kitchen Appears in no distress. Vitals Time Taken: 10:15 AM, Height: 68 in, Weight: 127 lbs, BMI: 19.3, Temperature: 97.7 F, Pulse: 56 bpm, Respiratory Rate: 17 breaths/min, Blood Pressure: 124/46 mmHg. General Notes: Wound exam; left medial malleolus. No improvement in surface area. Under illumination the wound bed appeared to have debris over the surface using a #3 curette fibrinous adherent debris removed from the wound surface. Integumentary (Hair, Skin) Wound #3 status is Open. Original cause of wound was Gradually Appeared. The wound is located on the Left,Medial Malleolus. The wound measures 1cm length x 1.2cm width x 0.1cm depth; 0.942cm^2 area and 0.094cm^3 volume. There is Fat Layer (Subcutaneous Tissue) Exposed exposed. There is no tunneling or undermining noted. There is a medium amount of serosanguineous drainage noted. The wound margin is flat and intact. There is large (67-100%) red, pink granulation within the wound bed. There is a small (1-33%) amount of necrotic tissue within the wound bed including Adherent Slough. 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 There was a Excisional Skin/Subcutaneous Tissue Debridement with a total area of 1.2 sq cm performed by [name]., MD. With the following instrument(s): Curette Material removed includes Subcutaneous Tissue and Slough and after achieving pain control using Other (benzocaine, 20%). No specimens were  taken. A time out was conducted at 10:49, prior to the start of the procedure. A Minimum amount of bleeding was controlled with Pressure. The procedure was tolerated well. Post Debridement Measurements: 1cm length x 1.2cm width x 0.1cm depth; 0.094cm^3 volume. Character of Wound/Ulcer Post Debridement is improved. Severity of Tissue Post Debridement is: Fat layer exposed. Post procedure Diagnosis Wound #3: Same as Pre-Procedure Plan Follow-up Appointments: Return Appointment in 1 week. Dressing Change Frequency: Wound #3 Left,Medial Malleolus: Do not change entire dressing for one week. Skin Barriers/Peri-Wound Care: Moisturizing lotion TCA Cream or Ointment - mixed with lotion Wound Cleansing: Clean wound with Wound Cleanser - clean with anasept with dressing changes May shower with protection. Primary Wound Dressing: Wound #3 Left,Medial Malleolus: Other: - Sorbact. Apply foam to shin for protection Secondary Dressing: Wound #3 Left,Medial Malleolus: Dry Gauze Kerramax Edema Control: Unna Boot to Left Lower Extremity - no kerlix layer Avoid standing for long periods of time Elevate legs to the level of the heart or above for 30 minutes daily and/or when sitting, a frequency of: - throughout the day Support Garment 20-30 mm/Hg pressure to: - compression stocking right leg daily #1. I change the primary dressing to Sorbact/dry gauze still under an Haematologist. The ideal here is to see if we can get  a better looking wound surface. #2. She had some surface abrasions on the left anterior tibial area which are no doubt small wrap injuries. We will pad this area a little more thoroughly Electronic Signature(s) Signed: 04/15/2019 5:33:43 PM By: Linton Ham MD Entered By: Linton Ham on 04/15/2019 11:10:05 -------------------------------------------------------------------------------- SuperBill Details Patient Name: Date of Service: [name] 04/15/2019 Medical  Record [id number] Patient Account Number: [id number] Date of Birth/Sex: Treating RN: Jun 22, 1955 (63 y.o. 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 L97.321 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 breakdown of skin Facility Procedures CPT4 Code Description: [id number] - DEB SUBQ TISSUE 20 SQ CM/< ICD-10 Diagnosis Description L97.322 Non-pressure chronic ulcer of left ankle with fat layer ex Modifier: posed Quantity: 1 Physician Procedures CPT4 Code Description: [id number] 11042 - WC PHYS SUBQ TISS 20 SQ CM ICD-10 Diagnosis Description [id number] Non-pressure chronic ulcer of left ankle with fat layer exp Modifier: osed Quantity: 1 Electronic Signature(s) Signed: 04/15/2019 5:33:43 PM By: Linton Ham MD Entered By: Linton Ham on 04/15/2019 11:10:17

## 2019-04-21 ENCOUNTER — Encounter: Payer: Medicare Other | Attending: Physical Medicine & Rehabilitation | Admitting: Physical Medicine & Rehabilitation

## 2019-04-21 ENCOUNTER — Other Ambulatory Visit: Payer: Self-pay

## 2019-04-21 ENCOUNTER — Encounter: Payer: Self-pay | Admitting: Physical Medicine & Rehabilitation

## 2019-04-21 VITALS — BP 119/75 | HR 55 | Temp 97.8°F | Ht 68.0 in | Wt 137.0 lb

## 2019-04-21 DIAGNOSIS — M79604 Pain in right leg: Secondary | ICD-10-CM | POA: Diagnosis not present

## 2019-04-21 DIAGNOSIS — R292 Abnormal reflex: Secondary | ICD-10-CM | POA: Diagnosis present

## 2019-04-21 DIAGNOSIS — G249 Dystonia, unspecified: Secondary | ICD-10-CM | POA: Diagnosis not present

## 2019-04-21 DIAGNOSIS — R269 Unspecified abnormalities of gait and mobility: Secondary | ICD-10-CM | POA: Diagnosis present

## 2019-04-21 DIAGNOSIS — G8929 Other chronic pain: Secondary | ICD-10-CM

## 2019-04-21 DIAGNOSIS — M79605 Pain in left leg: Secondary | ICD-10-CM

## 2019-04-21 DIAGNOSIS — M21542 Acquired clubfoot, left foot: Secondary | ICD-10-CM | POA: Diagnosis present

## 2019-04-21 NOTE — Progress Notes (Signed)
Botox: Procedure Note Patient Name: [name] DOB: 07/05/1955 MRN: [id number]   Procedure: Botulinum toxin administration Guidance: EMG Diagnosis: Left equinovarus Date: 01/17/2019 Attending: Delice Lesch, MD    Trade name: Botox (onabotulinumtoxinA)  Informed consent: Risks, benefits & options of the procedure are explained to the patient (and/or family). The patient elects to proceed with procedure. Risks include but are not limited to weakness, respiratory distress, dry mouth, ptosis, antibody formation, worsening of some areas of function. Benefits include decreased abnormal muscle tone, improved hygiene and positioning, decreased skin breakdown and, in some cases, decreased pain. Options include conservative management with oral antispasticity agents, phenol chemodenervation of nerve or at motor nerve branches. More invasive options include intrathecal balcofen adminstration for appropriate candidates. Surgical options may include tendon lengthening or transposition or, rarely, dorsal rhizotomy.   History/Physical Examination: 63 y.o. 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  Given concern of CNS pathology, will trial injection, workup ongoing.  Left foot: Equinovarus contracture  Previous Treatments: Therapy/Range of motion Indication for guidance: Target active muscules  Procedure: Botulinum toxin was mixed with preservative free saline with a dilution of 1cc to 100 units. Targeted limb and muscles were identified. The skin was prepped with alcohol swabs and placement of needle tip in targeted muscle was confirmed using appropriate guidance. Prior to injection, positioning of needle tip outside of blood vessel was determined by pulling back on syringe plunger.  MUSCLE UNITS Left Med Gastroc:  100 Left Lat Gastroc:  100 Left Soleus:   100 Left FHL:   100  Total units used: [id number] Complications: None  Plan: RTC in 6  weeks  Aloni Chuang [name] Keiosha Cancro 11:39 AM

## 2019-04-22 ENCOUNTER — Encounter (HOSPITAL_BASED_OUTPATIENT_CLINIC_OR_DEPARTMENT_OTHER): Payer: Medicare Other | Admitting: Internal Medicine

## 2019-04-22 DIAGNOSIS — L97321 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 breakdown of skin: Secondary | ICD-10-CM | POA: Diagnosis not present

## 2019-04-25 NOTE — Progress Notes (Signed)
[name] ([id number]) Visit Report for 04/22/2019 Debridement Details Patient Name: Date of Service: [name] 04/22/2019 10:00 AM Medical Record [id number] Patient Account Number: [id number] Date of Birth/Sex: 28-Dec-1955 (63 y.o. F) Treating RN: Kela Millin Primary Care Provider: [name] Other Clinician: Referring Provider: Treating Provider/Extender:Robson, Tawny Asal in Treatment: 332 Debridement Performed for Wound #3 Left,Medial Malleolus Assessment: Performed By: Physician [name]., MD Debridement Type: Debridement Severity of Tissue Pre Fat layer exposed Debridement: Level of Consciousness (Pre- Awake and Alert procedure): Pre-procedure Verification/Time Out Taken: Yes - 10:58 Start Time: 10:58 Pain Control: Other : benzocaine, 20% Total Area Debrided (L x W): 1.4 (cm) x 1.4 (cm) = 1.96 (cm) Tissue and other material Viable, Non-Viable, Slough, Subcutaneous, Fibrin/Exudate, Slough debrided: Level: Skin/Subcutaneous Tissue Debridement Description: Excisional Instrument: Curette Bleeding: Minimum Hemostasis Achieved: Pressure End Time: 10:59 Procedural Pain: 2 Post Procedural Pain: 0 Response to Treatment: Procedure was tolerated well Level of Consciousness Awake and Alert (Post-procedure): Post Debridement Measurements of Total Wound Length: (cm) 1.4 Width: (cm) 1.3 Depth: (cm) 0.1 Volume: (cm) 0.143 Character of Wound/Ulcer Post Improved Debridement: Severity of Tissue Post Debridement: Fat layer exposed Post Procedure Diagnosis Same as Pre-procedure Electronic Signature(s) Signed: 04/22/2019 6:05:38 PM By: Linton Ham MD Signed: 04/25/2019 2:17:56 PM By: Kela Millin Entered By: Linton Ham on 04/22/2019 12:26:24 -------------------------------------------------------------------------------- HPI Details Patient Name: Date of Service: [name] 04/22/2019 10:00  AM Medical Record [id number] Patient Account Number: [id number] Date of Birth/Sex: Treating RN: 1956/05/18 (63 y.o. [name] Primary Care Provider: [name] Other Clinician: Referring Provider: Treating Provider/Extender:Robson, Tawny Asal in Treatment: 332 History of Present Illness HPI Description: the remaining wound is over the left medial ankle. Similar wound over the right medial ankle healed largely with use of Apligraf. Most recently we have been using Hydrofera Blue over this wound with considerable improvement. The patient has been extensively worked up in the past for her venous insufficiency and she is not a candidate for antireflux surgery although I have none of the details available currently. 08/24/14; considerable improvement today. About 50% of this wound areas now epithelialized. The base of the wound appears to be healthier granulation.as opposed to last week when she had deteriorated a considerable improvement 08/17/14; unfortunately the wound has regressed somewhat. The areas of epithelialization from the superior aspect are not nearly as healthy as they were last week. The patient thinks her Hydrofera Blue slipped. 09/07/14; unfortunately the area has markedly regressed in the 2 weeks since I've seen this. There is an odor surrounding erythema. The healthy granulation tissue that we had at the base of the wound now is a dusky color. The nurse reports green drainage 09/14/14; the area looks somewhat better than last week. There is less erythema and less drainage. The culture I did did not show any growth. Nevertheless I think it is better to continue the Cipro and doxycycline for a further week. The remaining wound area was debridement. 09/21/14. Wound did not require debridement last week. Still less erythema and less drainage. She can complete her antibiotics. The areas of epithelialization in the superior aspect of the wound do not  look as healthy as they did some weeks ago 10/05/14 continued improvement in the condition of this wound. There is advancing epithelialization. Less aggressive debridement required 10/19/14 continued improvement in the condition and volume of this wound. Less aggressive debridement to the inferior part of this to remove surface slough and fibrinous eschar 11/02/14 no  debridement is required. The surface granulation appears healthy although some of her islands of epithelialization seem to have regressed. No evidence of infection 11/16/14; lites surface debridement done of surface eschar. The wound does not look to be unhealthy. No evidence of infection. Unfortunately the patient has had podiatry issues in the right foot and for some reason has redeveloped small surface ulcerations in the medial right ankle. Her original presentation involved wounds in this area 11/23/14 no debridement. The area on the right ankle has enlarged. The left ankle wound appears stable in terms of the surface although there is periwound inflammation. There has been regression in the amount of new skin 11/30/14 no debridement. Both wound areas appear healthy. There was no evidence of infection. The the new area on the right medial ankle has enlarged although that both the surfaces appear to be stable. 12/07/14; Debridement of the right medial ankle wound. No no debridement was done on the left. 12/14/14 no major change in and now bilateral medial ankle wounds. Both of these are very painful but the no overt evidence of infection. She has had previous venous ablation 12/21/14; patient states that her right medial ankle wound is considerably more painful last week than usual. Her left is also somewhat painful. She could not tolerate debridement. The right medial ankle wound has fibrinous surface eschar 12/28/14 this is a patient with severe bilateral venous insufficiency ulcers. For a considerable period of time we actually had the  one on the right medial ankle healed however this recently opened up again in June. The left medial ankle wound has been a refractory area with some absent flows. We had some success with Hydrofera Blue on this area and it literally closed by 50% however it is recently opened up Foley. Both of these were debridement today of surface eschar. She tolerates this poorly 01/25/15: No change in the status of this. Thick adherent escar. Very poor tolerance of any attempt at debridement. I had healed the right medial malleolus wound for a considerable amount of time and had the left one down to about 50% of the volume although this is totally regressed over the last 48 weeks. Further the right leg has reopened. she is trying to make a appointment with pain and vascular, previous ablations with Dr. Aleda Grana. I do not believe there is an arterial insufficiency issue here 02/01/15 the status of the adherent eschar bilaterally is actually improved. No debridement was done. She did not manage to get vascular studies done 02/08/15 continued debridement of the area was done today. The slough is less adherent and comes off with less pressure. There is no surrounding infection peripheral pulses are intact 02/15/15 selective debridement with a disposable curette. Again the slough is less adherent and comes off with less difficulty. No surrounding infection peripheral pulses are intact. 02/22/15 selective debridement of the right medial ankle wound. Slough comes off with less difficulty. No obvious surrounding infection peripheral pulses are intact I did not debridement the one on the left. Both of these are stable to improved 03/01/15 selective debridement of both wound areas using a curette to. Adherent slough cup soft with less difficulty. No obvious surrounding infection. The patient tells me that 2 days ago she noted a rash above the right leg wrap. She did not have this on her lower legs when she change this over  she arrives with widespread left greater than right almost folliculitis-looking rash which is extremely pruritic. I don't see anything to culture here. There  is no rash on the rest of her body. She feels well systemically. 03/08/15; selective debridement of both wounds using a curette. Base of this does not look unhealthy. She had limegreen drainage coming out of the left leg wound and describes a lot of drainage. The rash on her left leg looks improved to. No cultures were done. 03/22/15; patient was not here last week. Basal wounds does not look healthy and there is no surrounding erythema. No drainage. There is still a rash on the left leg that almost looks vasculitic however it is clearly limited to the top of where the wrap would be. 04/05/15; on the right required a surgical debridement of surface eschar and necrotic subcutaneous tissue. I did not debridement the area on the left. These continue to be large open wounds that are not changing that much. We were successful at one point in healing the area on the right, and at the same time the area on the left was roughly half the size of current measurements. I think a lot of the deterioration has to do with the prolonged time the patient is on her feet at work 04/19/15 I attempted-like surface debridement bilaterally she does not tolerate this. She tells me that she was in allergic care yesterday with extreme pain over her left lateral malleolus/ankle and was told that she has an "sprain" 05/03/15; large bilateral venous insufficiency wounds over the medial malleolus/medial aspect of her ankles. She complains of copious amounts of drainage and his usual large amounts of pain. There is some increasing erythema around the wound on the right extending into the medial aspect of her foot to. historically she came in with these wounds the right one healed and the left one came down to roughly half its current size however the right one is reopened  and the left is expanded. 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 05/10/15 large bilateral venous insufficiency wounds. There is less adherence surface left however the surface culture that I did last week grew pseudomonas therefore bilateral selective debridement score necessary. There is surrounding erythema. The patient describes severe bilateral drainage and a lot of pain in the left ankle. Apparently her podiatrist was were ready to do a cortisone shot 05/17/15; the patient complains of pain and again copious amounts of drainage. 05/24/15; we used Iodo flex last week. Patient notes considerable improvement in wound drainage. Only needed to change this once. 05/31/15; we continued Iodoflex; the base of these large wounds bilaterally is not too bad but there is probably likely a significant bioburden here. I would like to debridement just doesn't tolerate it. 06/06/14 I would like to continue the Iodoflex although she still hasn't managed to obtain supplies. She has bilateral medial malleoli or large wounds which are mostly superficial. Both of them are covered circumferentially with some nonviable fibrinous slough although she tolerates debridement very poorly. She apparently has an appointment for an ablation on the right leg by interventional radiology. 06/14/15; the patient arrives with the wounds and static condition. We attempted a debridement although she does not do well with this secondary to pain. I 07/05/15; wounds are not much smaller however there appears to be a cleaner granulating base. The left has tight fibrinous slough greater than the right. Debridement is tolerated poorly due to pain. Iodoflex is done more for these wounds in any of the multitude of different dressings I have tried on the  left 1 and then subsequently the right. 07/12/15; no change in the condition of this wound. I am able to do an aggressive debridement on the  right but not the left. She simply cannot tolerate it. We have been using Iodoflex which helps somewhat. It is worthwhile remembering that at one point we healed the right medial ankle wound and the left was about 25% of the current circumference. We have suggested returning to vascular surgery for review of possible further ablations for one reason or another she has not been able to do this. 07/26/15 no major change in the condition of either wound on her medial ankle. I did not attempt to debridement of these. She has been aggressively scrubbing these while she is in the shower at home. She has her supply of Iodoflex which seems to have done more for these wounds then anything I have put on recently. 08/09/15 wound area appears larger although not verified by measurements. Using Iodoflex 09/05/2015 -- she was here for avisit today but had significant problems with the wound and I was asked to see her for a physician opinion. I have summarize that this lady has had surgery on her left lower extremity about 10 years ago where the possible veins stripping was done. She has had an opinion from interventional radiology around November 2016 where no further sclerotherapy was ordered. The patient works 12 hours a day and stands on a concrete floor with work boots and is unable to get the proper compression she requires and cannot elevate her limbs appropriately at any given time. She has recently grown Pseudomonas from her wound culture but has not started her ciprofloxacin which was called in for her. 09/13/15 this continues to be a difficult situation for this patient. At one point I had this wound down to a 1.5 x 1.5" wound on her left leg. This is deteriorated and the right leg has reopened. She now has substantial wounds on her medial calcaneus, malleoli and into her lower leg. One on the left has surface eschar but these are far too painful for me to debridement here. She has a vascular surgery  appointment next week to see if anything can be done to help here. I think she has had previous ablations several years ago at [facility]. She has no major edema. She tells me that she did not get product last time [facility]] and went for several days without it. She continues to work in work boots 12 hours a day. She cannot get compression/4-layer under her work boots. 09/20/15 no major change. Periwound edema control was not very good. Her point with pain and vascular is next Wednesday the 25th 09/28/15; the patient is seen vascular surgery and is apparently scheduled for repeat duplex ultrasounds of her bilateral lower legs next week. 10/05/15; the patient was seen by Dr. Doren Custard of vascular surgery. He feels that she should have arterial insufficiency excluded as cause/contributed to her nonhealing stage she is therefore booked for an arteriogram. She has apparently monophasic signals in the dorsalis pedis pulses. She also of course has known severe chronic venous insufficiency with previous procedures as noted previously. I had another long discussion with the patient today about her continuing to work 12 hour shifts. I've written her out for 2 months area had concerns about this as her work location is currently undergoing significant turmoil and this may lead to her termination. She is aware of this however I agree with her that she simply cannot  continue to stand for 12 hours multiple days a week with the substantial wound areas she has. 10/19/15; the Dr. Doren Custard appointment was largely for an arteriogram which was normal. She does not have an arterial issue. He didn't make a comment about her chronic venous insufficiency for which she has had previous ablations. Presumably it was not felt that anything additional could be done. The patient is now out of work as I prescribed 2 weeks ago. Her wounds look somewhat less aggravated presumably because of this. I felt I would give  debridement another try today 10/25/15; no major change in this patient's wounds. We are struggling to get her product that she can afford into her own home through her insurance. 11/01/15; no major change in the patient's wounds. I have been using silver alginate as the most affordable product. I spoke to Dr. Marla Roe last week with her requested take her to the OR for surgical debridement and placement of ACEL. Dr. Marla Roe told me that she would be willing to do this however [facility] will not cover this, fortunately the patient has [place] healthcare of some variant 11/08/15; no major change in the patient's wounds. She has been completely nonviable surface that this but is in too much pain with any attempted debridement are clinic. I have arranged for her to see Dr. Marla Roe ham of plastic surgery and this appointment is on Monday. I am hopeful that they will take her to the OR for debridement, possible ACEL ultimately possible skin graft 11/22/15 no major change in the patient's wounds over her bilateral medial calcaneus medial malleolus into the lower legs. Surface on these does not look too bad however on the left there is surrounding erythema and tenderness. This may be cellulitis or could him sleepy tinea. 11/29/15; no major changes in the patient's wounds over her bilateral medial malleolus. There is no infection here and I don't think any additional antibiotics are necessary. There is now plan to move forward. She sees Dr. Marla Roe in a week's time for preparation for operative debridement and ACEL placement I believe on 7/12. She then has a follow-up appointment with Dr. Marla Roe on 7/21 12/28/15; the patient returns today having been taken to the [facility] by Dr. Marla Roe 12/12/15 she underwent debridement, intraoperative cultures [which were negative]. She had placement of a wound VAC. Parent really ACEL was not available to be placed. The wound VAC foam apparently adhered  to the wound since then she's been using silver alginate, Xeroform under Ace wraps. She still says there is a lot of drainage and a lot of pain 01/31/16; this is a patient I see monthly. I had referred her to Dr. Marla Roe him of plastic surgery for large wounds on her bilateral medial ankles. She has been to the OR twice once in early July and once in early August. She tells me over the last 3 weeks she has been using the wound VAC with ACEL underneath it. On the right we've simply been using silver alginate. Under Kerlix Coban wraps. 02/28/16; this is a patient I'm currently seeing monthly. She is gone on to have a skin graft over her large venous insufficiency ulcer on the left medial ankle. This was done by Dr. Marla Roe him. The patient is a bit perturbed about why she didn't have one on her right medial ankle wound. She has been using silver alginate to this. 03/06/16; I received a phone call from her plastic surgery Dr. Marla Roe. She expressed some concern about  the viability of the skin graft she did on the left medial ankle wound. Asked me to place Endoform on this. She told me she is not planning to do a subsequent skin graft on the right as the left one did not take very well. I had placed Hydrofera Blue on the right 03/13/16; continue to have a reasonably healthy wound on the right medial ankle. Down to 3 mm in terms of size. There is epithelialization here. The area on the left medial ankle is her skin graft site. I suppose the last week this looks somewhat better. She has an open area inferiorly however in the center there appears to be some viable tissue. There is a lot of surface callus and eschar that will eventually need to come off however none of this looked to be infected. Patient states that the is able to keep the dressing on for several days which is an improvement. 03/20/16 no major change in the circumference of either wound however on the left side the patient was at  Dr. Eusebio Friendly office and they did a debridement of left wound. 50% of the wound seems to be epithelialized. I been using Endoform on the left Hydrofera Blue in the right 03/27/16; she arrives today with her wound is not looking as healthy as they did last week. The area on the right clearly has an adherent surface to this a very similar surface on the left. Unfortunately for this patient this is all too familiar problem. Clearly the Endoform is not working and will need to change that today that has some potential to help this surface. She does not tolerate debridement in this clinic very well. She is changing the dressing wants 04/03/16; patient arrives with the wounds looking somewhat better especially on the right. Dr. Migdalia Dk change the dressing to silver alginate when she saw her on Monday and also sold her some compression socks. The usefulness of the latter is really not clear and woman with severely draining wounds. 04/10/16; the patient is doing a bit of an experiment wearing the compression stockings that Dr. Migdalia Dk provided her to her left leg and the out of legs based dressings that we provided to the right. 05/01/16; the patient is continuing to wear compression stockings Dr. Migdalia Dk provided her on the left that are apparently silver impregnated. She has been using Iodoflex to the right leg wound. Still a moderate amount of drainage, when she leaves here the wraps only last for 4 days. She has to change the stocking on the left leg every night 05/15/16; she is now using compression stockings bilaterally provided by Dr. Marla Roe. She is wearing a nonadherent layer over the wounds so really I don't think there is anything specific being done to this now. She has some reduction on the left wound. The right is stable. I think all healing here is being done without a specific dressing 06/09/16; patient arrives here today with not much change in the wound certainly in diameter to large  circular wounds over the medial aspect of her ankle bilaterally. Under the light of these services are certainly not viable for healing. There is no evidence of surrounding infection. She is wearing compression stockings with some sort of silver impregnation as prescribed by Dr. Marla Roe. She has a follow-up with her tomorrow. 06/30/16; no major change in the size or condition of her wounds. These are still probably covered with a nonviable surface. She is using only her purchase stockings. She did see Dr. Marla Roe  who seemed to want to apply Dakin's solution to this I'm not extreme short what value this would be. I would suggest Iodoflex which she still has at home. 07/28/16; I follow Mrs. Desautels episodically along with Dr. Marla Roe. 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 She has been applying a topical collagen ointment to both wounds with Adaptic. I don't think Dr. Marla Roe is planning to take her back to the OR. 08/19/16; I follow Mrs. Jeneen Rinks on a monthly basis along with Dr. Marla Roe of plastic surgery. 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 I been following her for a number of years. At one point I was able to get the right medial malleolus wound to heal and had the left medial malleolus down to about half its current size however and I had to send her to plastic surgery for an operative debridement. Since then things have been stable to slightly improve the area on the right is slightly better one in the left about the same although there is much less adherent surface than I'm used to with this patient. She is using some form of liquid collagen gel that Dr. Marla Roe provided a Kerlix cover with the patient's own pressure stockings. She tells me that she has extreme pain in both ankles and along the lateral aspect of both feet. She has been unable  to work for some period of time. She is telling me she is retiring at the beginning of April. She sees Dr. Doran Durand of orthopedics next week 09/22/16; patient has not seen Dr. Marla Roe since the last time she is here. I'm not really sure what she is using to the wounds other than bits and pieces of think she had left over including most recently Hydrofera Blue. She is using juxtalite stockings. She is having difficulty with her husband's recent illness "stroke". She is having to transport him to various doctors appointments. Dr. Marla Roe left her the option of a repeat debridement with ACEL however she has not been able to get the time to follow-up on this. She continues to have a fair amount of drainage out of these wounds with certainly precludes leaving dressings on all week 10/13/16; patient has not seen Dr. Marla Roe since she was last in our clinic. I'm not really sure what she is doing with the wounds, we did try to get her [facility] and I think she is actually using this most of the time. Because of drainage she states she has to change this every second day although this is an improvement from what she used to do. She went to see Dr. Doran Durand who did not think she had a muscular issue with regards to her feet, he referred her to a neurologist and I think the appointment is sometime in June. I changed her back to Iodoflex which she has used in the past but not recently. 11/03/16; the patient has been using Iodoflex although she ran out of this. Still claims that there is a lot of drainage although the wound does not look like this. No surrounding erythema. She has not been back to see Dr. Marla Roe 11/24/16; the patient has been using Iodoflex again but she ran out of it 2 or 3 days ago. There is no major change in the condition of either one of these wounds in fact they are larger and covered in a thick adherent surface slough/nonviable tissue especially  on the left. She does not  tolerate mechanical debridement in our clinic. Going back to see Dr. Marla Roe of plastic surgery for an operative debridement would seem reasonable. 12/15/16; the patient has not been back to see Dr. Marla Roe. She is been dealing with a series of illnesses and her husband which of monopolized her time. She is been using Sorbact which we largely supplied. She states the drainage is bad enough that it maximum she can go 2-3 days without changing the dressing 01/12/2017 -- the patient has not been back for about 4 weeks and has not seen Dr. Marla Roe not does she have any appointment pending. 01/23/17; patient has not seen Dr. Marla Roe even though I suggested this previously. She is using Santyl that was suggested last week by Dr. Con Memos this Cost her $16 through her insurance which is indeed surprising 02/12/17; continuing Santyl and the patient is changing this daily. A lot of drainage. She has not been back to see plastic surgery she is using an Ace wrap. Our intake nurse suggested wrap around stockings which would make a good reasonable alternative 02/26/17; patient is been using Santyl and changing this daily due to drainage. She has not been to see plastic surgery she uses in April Ace wrap to control the edema. She did obtain extremitease stockings but stated that the edema in her leg was to big for these 03/20/17; patient is using Santyl and Anasept. Surfaces looked better today the area on the right is actually measuring a little smaller. She has states she has a lot of pain in her feet and ankles and is asking for a consult to pain control which I'll try to help her with through our case manager. 04/10/17; the patient arrives with better-looking wound surfaces and is slightly smaller wound on the left she is using a combination of Santyl and Anasept. She has an appointment or at least as started in the pain control center associated with [facility] 05/14/17; this is a patient who  I followed for a prolonged period of time. 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 At one point I had this down to a much smaller wound on the left however these reopened and we've never been able to get these to heal. She has been using Santyl and Anasept gel although 2 weeks ago she ran out of the Anasept gel. She has a stable appearance of the wound. She is going to the wound care clinic at [facility]. They wanted do a nerve block/spinal block although she tells me she is reluctant to go forward with that. 05/21/17; this is a patient I have followed for many years. 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 Chronic pain and deformity in her ankles as well. She is been to see plastic surgery as well as orthopedics. Using PolyMem AG most recently/Kerramax/ABDs and 2 layer compression. She has managed to keep this on and she is coming in for a nurse check to change the dressing on Tuesdays, we see her on Fridays 06/05/17; really quite a good looking surface and the area especially on the right medial has contracted in terms of dimensions. Well granulated healthy-looking tissue on both sides. Even with an open curet there is nothing that even feels abnormal here. This is as good as I've seen this in quite some time. We have been using PolyMem AG and bringing her in for a nurse check 06/12/17; really quite good surface on both of these  wounds. The right medial has contracted a bit left is not. We've been using PolyMem and AG and she is coming in for a nurse visit 06/19/17; we have been using PolyMem AG and bringing her in for a nurse check. Dimensions of her wounds are not better but the surfaces looked better bilaterally. She complained of bleeding last night and the left wound and increasing pain bilaterally. She states her wound pain is more neuropathic than just the wounds. There was some suggestion that this was radicular from her pain  management doctor in talking to her it is really difficult to sort this out. 06/26/17; using PolyMem and AG and bringing her in for a nurse check as All of this and reasonably stable condition. Certainly not improved. The dimensions on the lateral part of the right leg look better but not really measuring better. The medial aspect on the left is about the same. 07/03/16; we have been using PolyMen AG and bringing her in for a nurse check to change the dressings as the wounds have drainage which precludes once weekly changing. We are using all secondary absorptive dressings.our intake nurse is brought up the idea of using a wound VAC/snap VAC on the wound to help with the drainage to see if this would result in some contraction. This is not a bad idea. The area on the right medial is actually looking smaller. Both wounds have a reasonable-looking surface. There is no evidence of cellulitis. The edema is well controlled 07/10/17; the patient was denied for a snap VAC by her insurance. The major issue with these wounds continues to be drainage. We are using wicked PolyMem AG and she is coming in for a nurse visit to change this. The wounds are stable to slightly improved. The surface looks vibrant and the area on the right certainly has shrunk in size but very slowly 07/17/17; the patient still has large wounds on her bilateral medial malleoli. Surface of both of these wounds looks better. The dimensions seem to come and go but no consistent improvement. There is no epithelialization. We do not have options for advanced treatment products due to insurance issues. They did not approve of the wound VAC to help control the drainage. More recently we've been using PolyMem and AG wicked to allow drainage through. We have been bringing her in for a nurse visit to change this. We do not have a lot of options for wound care products and the home again due to insurance issues 07/24/17; the patient's wound actually  looks somewhat better today. No drainage measurements are smaller still healthy-looking surface. We used silver collagen under PolyMen started last week. We have been bringing her in for a dressing change 07/31/17; patient's wound surface continued to look better and I think there is visible change in the dimensions of the wound on the right. Rims of epithelialization. We have been using silver collagen under PolyMen and bringing her in for a dressing change. There appears to be less drainage although she is still in need of the dressing change 08/07/17. Patient's wound surface continues to look better on both sides and the area on the right is definitely smaller. We have been using silver collagen and PolyMen. She feels that the drainage has been it has been better. I asked her about her vascular status. She went to see Dr. Aleda Grana at [facility] and had some form of ablation. I don't have much detail on this. I haven't my notes from 2016 that she  was not a candidate for any further ablation but I don't have any more information on this. We had referred her to vein and vascular I don't think she ever went. He does not have a history of PAD although I don't have any information on this either. We don't even have ABIs in our record 08/14/17; we've been using silver collagen and PolyMen cover. And putting the patient and compression. She we are bringing her in as a nurse visit to change this because ofarge amount of drainage. We didn't the ABIs in clinic today since they had been done in many moons 1.2 bilaterally. She has been to see vein and vascular however this was at [facility] and she had ablation although I really don't have any information on this all seemed biking get a report. She is also been operatively debrided by plastic surgery and had a cell placed probably 8-12 months ago. This didn't have a major effect. We've been making some gains with current dressings 08/19/17-She is here  in follow-up evaluation for bilateral medial malleoli ulcers. She continues to tolerate debridement very poorly. We will continue with recently changed topical treatment; if no significant improvement may consider switching to Iodosorb/Iodoflex. 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 She has been using silver collagen and PolyMem. I believe she has been used and tried on Iodoflex before. 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 These have been very refractory over time. We have been using silver collagen and PolyMen. She is coming in once a week for a doctor's and once a week for nurse visits. We are actually making some progress 09/18/17; the patient's wounds are smaller especially on the right medial. She arrives today to upset to consider even washing these off with Anasept which I think is been part of the reason this is been closing. We've been using collagen covered in PolyMen otherwise. It is noted that she has a small area of folliculitis on the right medial calf that. As we are wrapping her legs I'll give her a short course of doxycycline to make sure this doesn't amount to anything. She is a long list of complaints today including imbalance, shortness of breath on exertion, inversion of her left ankle. With regards to the latter complaints she is been to see orthopedics and they offered her a tendon release surgery I believe but wanted her wounds to be closed first. I have recommended she go see her primary physician with regards to everything else. 09/25/17; patient's wounds are about the same size. We have made some progress bilaterally although not in recent weeks. She will not allow me T wash these wounds with Anasept even if she is doing  her cell. Wheeze we've been using collagen covered in PolyMen. Last week she had a small area of folliculitis this is now opened into a small wound. She completed 5 days of trimethoprim sulfamethoxazole 10/02/17; unfortunately the area on her left medial ankle is worse with a larger wound area towards the Achilles. The patient complains of a lot of pain. She will not allow debridement although visually I don't think there is anything to debridement in any case. We have been using silver collagen and PolyMen for several months now. Initially we are making some progress although I'm not  really seeing that today. We will move back to [facility]. His admittedly this is a bit of a repeat however I'm hoping that his situation is different now. The patient tells me she had her leg on the left give out on her yesterday this is process some pain. 10/09/17; the patient is seen twice a week largely because of drainage issues coming out of the chronic medial bimalleolar wounds that are chronic. Last week the dimensions of the one on the left looks a little larger I changed her to [facility]. She comes in today with a history of terrible pain in the bilateral wound areas. She will not allow debridement. She will not even allow a tissue culture. There is no surrounding erythema no no evidence of cellulitis. We have been putting her Kerlix Coban man. She will not allow more aggressive compression as there was a suggestion to put her in 3 layer wraps. 10/16/17; large wounds on her bilateral medial malleoli. These are chronic. Not much change from last week. The surface looks have healthy but absolutely no epithelialization. A lot of pain little less so of drainage. She will not allow debridement or even washing these off in the vigorous fashion with Anasept. 10/23/17; large wounds on her bilateral malleoli which are chronic. Some improvement in terms of size perhaps on the right since last time I saw these.  She states that after we increased the 3 layer compression there was some bleeding, when she came in for a nurse visit she did not want 3 layer compression put back on about our nurse managed to convince her. She has known chronic venous visit issues and I'm hoping to get her to tolerate the 3 layer compression. using Hydrofera Blue 10/30/17; absolutely no change in the condition of either wound although we've had some improvement in dimensions on the right.. Attempted to put her in 3 layer compression she didn't tolerated she is back in 2 layer compression. We've been using Hydrofera Blue We looked over her past records. She had venous reflux studies in November 2016. There was no evidence of deep venous reflux on the right. Superficial vein did not show the greater saphenous vein at think this is been previously ablated the small saphenous vein was within normal limits. The left deep venous system showed no DVT the vessels were positive for deep venous reflux in the posterior tibial veins at the ankle. The greater saphenous vein was surgically absent small saphenous vein was within normal limits. She went to vein and vascular at [facility]. I believe she had an ablation on the left greater saphenous vein. I'll update her reflux studies perhaps ever reviewed by vein and vascular. We've made absolutely no progress in these wounds. Will also try to read and TheraSkins through her insurance 11/06/17; W the patient apparently has a 2 week follow-up with vein and vascular I like him to review the whole issue with regards to her previous vascular workup by Dr. Aleda Grana. We've really made no progress on these wounds in many months. She arrives today with less viable looking surface on the left medial ankle wound. This was apparently looking about the same on Tuesday when she was here for nurse visit. 11/13/17; deep tissue culture I did last time of the left lower leg showed multiple organisms without  any predominating. In particular no Staphylococcus or group A strep were isolated. We sent her for venous reflux studies. She's had a previous left greater saphenous vein stripping and I  think sclerotherapy of the right greater saphenous vein. She didn't really look at the lesser saphenous vein this both wounds are on the medial aspect. She has reflux in the common femoral vein and popliteal vein and an accessory vein on the right and the common femoral vein and popliteal vein on the left. I'm going to have her go to see vein and vascular just the look over things and see if anything else beside aggressive compression is indicated here. We have not been able to make any progress on these wounds in spite of the fact that the surface of the wounds is never look too bad. 11/20/17; no major change in the condition of the wounds. Patient reports a large amount of drainage. She has a lot of complaints of pain although enlisting her today I wonder if some of this at least his neuropathic rather than secondary to her wounds. She has an appointment with vein and vascular on 12/30/17. The refractory nature of these wounds in my mind at least need vein and vascular to look over the wounds the recent reflux studies we did and her history to see if anything further can be done here. I also note her gait is deteriorated quite a bit. 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 She has a bilateral Trendelenburg gait. I wonder if this is neuropathic or perhaps multilevel radicular. 11/27/17; her wounds actually looks slightly better. Healthy-looking granulation tissue a scant amount of epithelialization. [place] healthcare will not pay for [employer]. They will play for tri layer Oasis and Dermagraft. This is not a diabetic ulcer. We'll try for the tri layer Oasis. She still complains of some drainage. She has a vein and vascular appointment on 12/30/17 12/04/17; the wounds visually look quite good.  Healthy-looking granulation with some degree of epithelialization. We are still waiting for response to our request for trial to try layer Oasis. Her appointment with vascular to review venous and arterial issues isn't sold the end of July 7/31. Not allow debridement or even vigorous cleansing of the wound surface. 12/18/17; slightly smaller especially on the right. 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 We've been using Hydrofera Blue. We still are looking into triple layer Oasis through her insurance 01/08/18 on evaluation today patient's wound actually appears to be showing signs of good improvement at this point in time. She has been tolerating the dressing changes without complication. Fortunately there does not appear to be any evidence of infection at this point in time. We have been utilizing silver nitrate which does seem to be of benefit for her which is also good news. Overall I'm very happy with how things seem to be both regards appearance as well as measurement. Patient did see Dr. Bridgett Larsson for evaluation on 12/30/17. In his assessment he felt that stripping would not likely add much more than chronic compression to the patient's healing process. His recommendation was to follow-up in three months with Dr. Doren Custard if she hasn't healed in order to consider referral back to you and see vascular where she previously was in a trial and was able to get her wound to heal. I'll be see what she feels she when you staying compression and he reiterated this as well. 01/13/18 on evaluation today patient appears to actually be doing very well in regard to her bilateral medial malleolus ulcers. She seems to have tolerated the chemical cauterization with silver nitrate last week she did have some pain through that evening but  fortunately states that I'll be see since it seems to be doing better she is overall pleased with the progress. 01/21/18; really quite a remarkable  improvement since I've last seen these wounds. We started using silver nitrate specially on the islands of hyper granulation which for some reason her around the wound circumference. This is really done quite nicely. Primary dressing Hydrofera Blue under 4 layer compression. She seems to be able to hold out without a nurse rewrap. Follow-up in 1 week 01/28/18; we've continued the hydrofera blue but continued with chemical cauterization to the wound area that we started about a month ago for irregular hyper granulation. She is made almost stunning improvement in the overall wound dimensions. I was not really expecting this degree of improvement in these chronic wounds 02/05/18; we continue with Hydrofera Bluebut of also continued the aggressive chemical cauterization with silver nitrate. We made nice progress with the right greater than left wound. 02/12/18. We continued with Hydrofera Blue after aggressive chemical cauterization with silver nitrate. We appear to be making nice progress with both wound areas 02/19/2018; we continue with [facility] after washing the wounds vigorously with Anasept spray and chemical cauterization with silver nitrate. We are making excellent progress. The area on the right's just about closed 02/26/2018. The area on the left medial ankle had too much necrotic debris today. I used a #5 curette we are able to get most of the soft. I continued with the silver nitrate to the much smaller wound on the right medial ankle she had a new area on her right lower pretibial area which she says was due to a role in her compression 03/05/2018; both wound areas look healthy. Not much change in dimensions from last week. I continue to use silver nitrate and Hydrofera Blue. The patient saw Dr. Doren Custard of vein and vascular. He felt she had venous stasis ulcers. He felt based on her previous arteriogram she should have adequate circulation for healing. Also she has deep venous reflux but  really no significant correctable superficial venous reflux at this time. He felt we should continue with conservative management including leg elevation and compression 04/02/2018; since we last saw this woman about a month ago she had a fall apparently suffered a pelvic fracture. I did not look up the x-ray. Nevertheless because of pain she literally was bedbound for 2 weeks and had home health coming out to change the dressing. Somewhat predictably this is resulted in considerable improvement in both wound areas. The right is just about closed on the medial malleolus and the left is about half the size. 04/16/2018; both her wounds continue to go down in size. Using Hydrofera Blue. 05/07/18; both her wounds appeared to be improving especially on the right where it is almost closed. We are using Hydrofera Blue 05/14/2018; slightly worse this week with larger wounds. Surface on the left medial not quite as good. We have been using Hydrofera Blue 05/21/18; again the wounds are slightly larger. Left medial malleolus slightly larger with eschar around the circumference. We have been using Hydrofera Blue undergoing a wraps for a prolonged period of time. This got a lot better when she was more recumbent due to a fall and a back injury. I change the primary dressing the silver alginate today. She did not tolerate a 4 layer compression previously although I may need to bring this up with her next time 05/28/2018; area on the left medial malleolus again is slightly larger with more drainage. Area on  the right is roughly unchanged. She has a small area of folliculitis on the right medial just on the lower calf. This does not look ominous. 06/03/2018 left medial malleolus slightly smaller in a better looking surface. We used silver nitrate on this last time with silver alginate. The area on the right appears slightly smaller 1/10; left medial malleolus slightly smaller. Small open area on the right. We used  silver nitrate and silver alginate as of 2 weeks ago. We continue with the wound and compression. These got a lot better when she was off her feet 1/17; right medial malleolus wound is smaller. The left may be slightly smaller. Both surfaces look somewhat better. 1/24; both wounds are slightly smaller. Using silver alginate under Unna boots 1/31; both wounds appear smaller in fact the area on the right medial is just about closed. Surface eschar. We have been using silver alginate under Unna boots. The patient is less active now spends let much less time on her feet and I think this is contributed to the general improvement in the wound condition 2/7; both wounds appear smaller. I was hopeful the right medial would be closed however there there is still the same small open area. Slight amount of surface eschar on the left the dimensions are smaller there is eschar but the wound edges appear to be free. We have been using silver alginate under Unna boot's 2/14; both wounds once again measure smaller. Circumferential eschar on the left medial. We have been using silver alginate under Unna boots with gradual improvement 2/21; the area on the right medial malleolus has healed. The area on the left is smaller. We have been using silver alginate and Unna boots. We can discharge wrapping the right leg she has 20/30 stockings at home she will need to protect the scar tissue in this area 2/28; the area on the right medial malleolus remains closed the patient has a compression stocking. The area on the left is smaller. We have been using silver alginate and Unna boots. 3/6 the area on the right medial ankle remains closed. Good edema control noted she is using her own compression stocking. The area on the left medial ankle is smaller. We have been managing this with silver alginate and Unna boots which we will continue today. 3/13; the area on the right medial ankle remains closed and I'm declaring it  healed today. When necessary the left is about the same still a healthy-looking surface but no major change and wound area. No evidence of infection and using silver alginate under unna and generally making considerable improvement 3/27 the area on the right medial ankle remains closed the area on the left is about the same as last week. Certainly not any worse we have been using silver alginate under an Unna boot 4/3; the area on the right medial ankle remains closed per the patient. We did not look at this wound. The wound on the left medial ankle is about the same surface looks healthy we have been using silver alginate under an Unna boot 4/10; area on the right medial ankle remains closed per the patient. We did not look at this wound. The wound on the left medial ankle is slightly larger. The patient complains that the [facility] caused burning pain all week. She also told us that she was a lot more active this week. Changed her back to silver alginate 4/17; right medial ankle still closed per the patient. Left medial ankle is slightly larger.  Using silver alginate. She did not tolerate Hydrofera Blue on this area 4/24; right medial ankle remains closed we have not look at this. The left medial ankle continues to get larger today by about a centimeter. We have been using silver alginate under Unna boots. She complains about 4 layer compression as an alternative. She has been up on her feet working on her garden 5/8; right medial ankle remains closed we did not look at this. The left medial ankle has increased in size about 100%. We have been using silver alginate under Unna boots. She noted increased pain this week and was not surprised that the wound is deteriorated 5/15; no major change in SA however much less erythema ( one week of doxy ocellulitis). 5/22-63 year old female returns at 1 week to the clinic for left medial ankle wound for which we have been using silver alginate under 3  layer compression She was placed on DOXY at last visit - the wound is wider at this visit. She is in 3 layer compression 5/29; change to [facility] last week. I had given her empiric doxycycline 2 weeks ago for a week. She is in 3 layer compression. She complains of a lot of pain and drainage on presentation today. 6/5; using Hydrofera Blue. I gave her doxycycline recently empirically for erythema and pain around the wound. Believe her cultures showed enterococcus which not would not have been well covered by doxycycline nevertheless the wound looks better and I don't feel specifically that the enterococcus needs to be covered. She has a new what looks like a wrap injury on her lateral left ankle. 6/12; she is using Hydrofera Blue. She has a new area on the left anterior lower tibial area. This was a wrap injury last week. 6/19; the patient is using Hydrofera Blue. She arrived with marked inflammation and erythema around the wound and tenderness. 12/01/18 on evaluation today patient appears to be doing a little bit better based on what I'm hearing from the standpoint of lassos evaluation to this as far as the overall appearance of the wound is concerned. Then sometime substandard she typically sees Dr. Dellia Nims. Nonetheless overall very pleased with the progress that she's made up to this point. No fevers, chills, nausea, or vomiting noted at this time. 7/10; some improvement in the surface area. Aggressively debrided last week apparently. I went ahead with the debridement today although the patient does not tolerate this very well. We have been using Iodoflex. Still a fair amount of drainage 7/17; slightly smaller. Using Iodoflex. 7/24; no change from last week in terms of surface area. We have been using Iodoflex. Surface looks and continues to look somewhat better 7/31; surface area slightly smaller better looking surface. We have been using Iodoflex. This is under Unna  boot compression 8/7-Patient presents at 1 week with Unna boot and Iodoflex, wound appears better 8/14-Patient presents at 1 week with Iodoflex, we use the Unna boot, wound appears to be stable better.Patient is getting Botox treatment for the inversion of the foot for tendon release, Next week 8/21; we are using Iodoflex. Unna boot. The wound is stable in terms of surface area. Under illumination there is some areas of the wound that appear to be either epithelialized or perhaps this is adherent slough at this point I was not really clear. It did not wipe off and I was reluctant to debride this today. 8/28; we are using Iodoflex in an Unna boot. Seems to be making good improvement. 9/4; using  Iodoflex and wound is slightly smaller. 9/18; we are using Iodoflex with topical silver nitrate when she is here. The wound continues to be smaller 10/2; patient missed her appointment last week due to GI issues. She left and Iodoflex based dressing on for 2 weeks. Wound is about the same size about the size of a dime on the left medial lower 10/9 we have been using Iodoflex on the medial left ankle wound. She has a new superficial probable wrap injury on the dorsal left ankle 10/16; we have been using Hydrofera Blue since last week. This is on the left medial ankle 10/23; we have been using Hydrofera Blue since 2 weeks ago. This is on the left medial ankle. Dimensions are better 11/6; using Hydrofera Blue. I think the wound is smaller but still not closed. Left medial ankle 11/13; we have been using Hydrofera Blue. Wound is certainly no smaller this week. Also the surface not as good. This is the remanent of a very large area on her left medial ankle. 11/20; using Sorbact since last week. Wound was about the same in terms of size although I was disappointed about the surface debris Electronic Signature(s) Signed: 04/22/2019 6:05:38 PM By: Linton Ham MD Entered By: Linton Ham on 04/22/2019  12:27:23 -------------------------------------------------------------------------------- Physical Exam Details Patient Name: Date of Service: [name] 04/22/2019 10:00 AM Medical Record [id number] Patient Account Number: [id number] Date of Birth/Sex: Treating RN: 16-Sep-1955 (63 y.o. [name] Primary Care Provider: [name] Other Clinician: Referring Provider: Treating Provider/Extender:Robson, Tawny Asal in Treatment: 332 Constitutional Sitting or standing Blood Pressure is within target range for patient.. Pulse regular and within target range for patient.Marland Kitchen Respirations regular, non-labored and within target range.. Temperature is normal and within the target range for the patient.Marland Kitchen Appears in no distress. Eyes Conjunctivae clear. No discharge.no icterus. Respiratory work of breathing is normal. Cardiovascular No edema. Integumentary (Hair, Skin) no surrounding erythema. Psychiatric appears at normal baseline. Notes Wound Exam; left medial malleolus. No improvement in surface area. Under illumination the wound bed did not have a healthy surface. Using a #3 curette debrided of tightly adherent fibrinous debris. No evidence of infection. No erythema around the wound Electronic Signature(s) Signed: 04/22/2019 6:05:38 PM By: Linton Ham MD Entered By: Linton Ham on 04/22/2019 12:32:28 -------------------------------------------------------------------------------- Physician Orders Details Patient Name: Date of Service: [name] 04/22/2019 10:00 AM Medical Record [id number] Patient Account Number: [id number] Date of Birth/Sex: Treating RN: Aug 13, 1955 (63 y.o. 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 - change dressing in one week Skin Barriers/Peri-Wound Care Moisturizing lotion TCA Cream or Ointment - mixed with lotion Wound Cleansing Clean wound with Wound Cleanser - clean with anasept with dressing changes May shower with protection. Primary Wound Dressing Wound #3 Left,Medial Malleolus Other: - Sorbact. Apply foam to shin for protection Secondary Dressing Wound #3 Left,Medial Malleolus Dry Gauze Kerramax Edema Control Unna Boot to Left Lower Extremity - no kerlix layer Avoid standing for long periods of time Elevate legs to the level of the heart or above for 30 minutes daily  and/or when sitting, a frequency of: - throughout the day Support Garment 20-30 mm/Hg pressure to: - compression stocking right leg daily Electronic Signature(s) Signed: 04/22/2019 6:05:38 PM By: Linton Ham MD Signed: 04/25/2019 2:17:56 PM By: Kela Millin Entered By: Kela Millin on 04/22/2019 11:02:48 -------------------------------------------------------------------------------- Problem List Details Patient Name: Date of Service: [name] 04/22/2019 10:00 AM Medical Record [id number] Patient Account Number: [id number] Date of Birth/Sex: Treating RN: 08-16-1955 (63 y.o. 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 08/03/2014 08/03/2014 and inflammation L03.116 Cellulitis of left lower limb 09/07/2014 09/07/2014 L97.321 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 breakdown 03/11/2019 03/11/2019 of skin Resolved Problems ICD-10 Code Description Active Date Resolved Date L97.312 Non-pressure chronic ulcer of right ankle with fat layer 04/10/2016 04/10/2016 exposed Electronic Signature(s) Signed: 04/22/2019 6:05:38 PM By: Linton Ham MD Entered By: Linton Ham on 04/22/2019 12:25:13 -------------------------------------------------------------------------------- Progress Note Details Patient Name: Date of Service: [name] 04/22/2019 10:00 AM Medical Record [id number] Patient Account Number: [id number] Date of Birth/Sex: Treating RN: 01-10-1956 (63 y.o. [name] Primary Care Provider: [name] Other Clinician: Referring Provider: Treating Provider/Extender:Robson, Tawny Asal in Treatment: 332 Subjective History of Present Illness (HPI) the remaining wound is over the left medial ankle. Similar wound over the right medial ankle healed largely with use of Apligraf. Most recently we have been using Hydrofera Blue over this wound with considerable improvement. The patient has been extensively worked up in the past for her venous insufficiency and she is not a candidate for antireflux surgery although I have none of the details available currently. 08/24/14; considerable improvement today. About 50% of this wound areas now epithelialized. The base of the wound appears to be healthier granulation.as opposed to last week when she had deteriorated a considerable improvement 08/17/14; unfortunately the wound has regressed  somewhat. The areas of epithelialization from the superior aspect are not nearly as healthy as they were last week. The patient thinks her Hydrofera Blue slipped. 09/07/14; unfortunately the area has markedly regressed in the 2 weeks since I've seen this. There is an odor surrounding erythema. The healthy granulation tissue that we had at the base of the wound now is a dusky color. The nurse reports green drainage 09/14/14; the area looks somewhat better than last week. There is less erythema and less drainage. The culture I did did not show any growth. Nevertheless I think it is better to continue the Cipro and doxycycline for a further week. The remaining wound area was debridement. 09/21/14. Wound did not require debridement last week. Still less erythema and less drainage. She can complete her antibiotics. The areas of epithelialization in the superior aspect of the wound do not look as healthy as they did some weeks ago 10/05/14 continued improvement in the condition of this wound. There is advancing epithelialization. Less aggressive debridement required 10/19/14 continued improvement in the condition and volume of this wound. Less aggressive debridement to the inferior part of this to remove surface slough and fibrinous eschar 11/02/14 no debridement is required. The surface  granulation appears healthy although some of her islands of epithelialization seem to have regressed. No evidence of infection 11/16/14; lites surface debridement done of surface eschar. The wound does not look to be unhealthy. No evidence of infection. Unfortunately the patient has had podiatry issues in the right foot and for some reason has redeveloped small surface ulcerations in the medial right ankle. Her original presentation involved wounds in this area 11/23/14 no debridement. The area on the right ankle has enlarged. The left ankle wound appears stable in terms of the surface although there is periwound inflammation.  There has been regression in the amount of new skin 11/30/14 no debridement. Both wound areas appear healthy. There was no evidence of infection. The the new area on the right medial ankle has enlarged although that both the surfaces appear to be stable. 12/07/14; Debridement of the right medial ankle wound. No no debridement was done on the left. 12/14/14 no major change in and now bilateral medial ankle wounds. Both of these are very painful but the no overt evidence of infection. She has had previous venous ablation 12/21/14; patient states that her right medial ankle wound is considerably more painful last week than usual. Her left is also somewhat painful. She could not tolerate debridement. The right medial ankle wound has fibrinous surface eschar 12/28/14 this is a patient with severe bilateral venous insufficiency ulcers. For a considerable period of time we actually had the one on the right medial ankle healed however this recently opened up again in June. The left medial ankle wound has been a refractory area with some absent flows. We had some success with Hydrofera Blue on this area and it literally closed by 50% however it is recently opened up Foley. Both of these were debridement today of surface eschar. She tolerates this poorly 01/25/15: No change in the status of this. Thick adherent escar. Very poor tolerance of any attempt at debridement. I had healed the right medial malleolus wound for a considerable amount of time and had the left one down to about 50% of the volume although this is totally regressed over the last 48 weeks. Further the right leg has reopened. she is trying to make a appointment with pain and vascular, previous ablations with Dr. Aleda Grana. I do not believe there is an arterial insufficiency issue here 02/01/15 the status of the adherent eschar bilaterally is actually improved. No debridement was done. She did not manage to get vascular studies done 02/08/15  continued debridement of the area was done today. The slough is less adherent and comes off with less pressure. There is no surrounding infection peripheral pulses are intact 02/15/15 selective debridement with a disposable curette. Again the slough is less adherent and comes off with less difficulty. No surrounding infection peripheral pulses are intact. 02/22/15 selective debridement of the right medial ankle wound. Slough comes off with less difficulty. No obvious surrounding infection peripheral pulses are intact I did not debridement the one on the left. Both of these are stable to improved 03/01/15 selective debridement of both wound areas using a curette to. Adherent slough cup soft with less difficulty. No obvious surrounding infection. The patient tells me that 2 days ago she noted a rash above the right leg wrap. She did not have this on her lower legs when she change this over she arrives with widespread left greater than right almost folliculitis-looking rash which is extremely pruritic. I don't see anything to culture here. There is no rash on the  rest of her body. She feels well systemically. 03/08/15; selective debridement of both wounds using a curette. Base of this does not look unhealthy. She had limegreen drainage coming out of the left leg wound and describes a lot of drainage. The rash on her left leg looks improved to. No cultures were done. 03/22/15; patient was not here last week. Basal wounds does not look healthy and there is no surrounding erythema. No drainage. There is still a rash on the left leg that almost looks vasculitic however it is clearly limited to the top of where the wrap would be. 04/05/15; on the right required a surgical debridement of surface eschar and necrotic subcutaneous tissue. I did not debridement the area on the left. These continue to be large open wounds that are not changing that much. We were successful at one point in healing the area on the  right, and at the same time the area on the left was roughly half the size of current measurements. I think a lot of the deterioration has to do with the prolonged time the patient is on her feet at work 04/19/15 I attempted-like surface debridement bilaterally she does not tolerate this. She tells me that she was in allergic care yesterday with extreme pain over her left lateral malleolus/ankle and was told that she has an "sprain" 05/03/15; large bilateral venous insufficiency wounds over the medial malleolus/medial aspect of her ankles. She complains of copious amounts of drainage and his usual large amounts of pain. There is some increasing erythema around the wound on the right extending into the medial aspect of her foot to. historically she came in with these wounds the right one healed and the left one came down to roughly half its current size however the right one is reopened and the left is expanded. 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 05/10/15 large bilateral venous insufficiency wounds. There is less adherence surface left however the surface culture that I did last week grew pseudomonas therefore bilateral selective debridement score necessary. There is surrounding erythema. The patient describes severe bilateral drainage and a lot of pain in the left ankle. Apparently her podiatrist was were ready to do a cortisone shot 05/17/15; the patient complains of pain and again copious amounts of drainage. 05/24/15; we used Iodo flex last week. Patient notes considerable improvement in wound drainage. Only needed to change this once. 05/31/15; we continued Iodoflex; the base of these large wounds bilaterally is not too bad but there is probably likely a significant bioburden here. I would like to debridement just doesn't tolerate it. 06/06/14 I would like to continue the Iodoflex although she still hasn't managed to obtain supplies. She  has bilateral medial malleoli or large wounds which are mostly superficial. Both of them are covered circumferentially with some nonviable fibrinous slough although she tolerates debridement very poorly. She apparently has an appointment for an ablation on the right leg by interventional radiology. 06/14/15; the patient arrives with the wounds and static condition. We attempted a debridement although she does not do well with this secondary to pain. I 07/05/15; wounds are not much smaller however there appears to be a cleaner granulating base. The left has tight fibrinous slough greater than the right. Debridement is tolerated poorly due to pain. Iodoflex is done more for these wounds in any of the multitude of different dressings I have tried on the left 1 and then  subsequently the right. 07/12/15; no change in the condition of this wound. I am able to do an aggressive debridement on the right but not the left. She simply cannot tolerate it. We have been using Iodoflex which helps somewhat. It is worthwhile remembering that at one point we healed the right medial ankle wound and the left was about 25% of the current circumference. We have suggested returning to vascular surgery for review of possible further ablations for one reason or another she has not been able to do this. 07/26/15 no major change in the condition of either wound on her medial ankle. I did not attempt to debridement of these. She has been aggressively scrubbing these while she is in the shower at home. She has her supply of Iodoflex which seems to have done more for these wounds then anything I have put on recently. 08/09/15 wound area appears larger although not verified by measurements. Using Iodoflex 09/05/2015 -- she was here for avisit today but had significant problems with the wound and I was asked to see her for a physician opinion. I have summarize that this lady has had surgery on her left lower extremity about 10 years  ago where the possible veins stripping was done. She has had an opinion from interventional radiology around November 2016 where no further sclerotherapy was ordered. The patient works 12 hours a day and stands on a concrete floor with work boots and is unable to get the proper compression she requires and cannot elevate her limbs appropriately at any given time. She has recently grown Pseudomonas from her wound culture but has not started her ciprofloxacin which was called in for her. 09/13/15 this continues to be a difficult situation for this patient. At one point I had this wound down to a 1.5 x 1.5" wound on her left leg. This is deteriorated and the right leg has reopened. She now has substantial wounds on her medial calcaneus, malleoli and into her lower leg. One on the left has surface eschar but these are far too painful for me to debridement here. She has a vascular surgery appointment next week to see if anything can be done to help here. I think she has had previous ablations several years ago at [facility]. She has no major edema. She tells me that she did not get product last time [facility] and went for several days without it. She continues to work in work boots 12 hours a day. She cannot get compression/4-layer under her work boots. 09/20/15 no major change. Periwound edema control was not very good. Her point with pain and vascular is next Wednesday the 25th 09/28/15; the patient is seen vascular surgery and is apparently scheduled for repeat duplex ultrasounds of her bilateral lower legs next week. 10/05/15; the patient was seen by Dr. Doren Custard of vascular surgery. He feels that she should have arterial insufficiency excluded as cause/contributed to her nonhealing stage she is therefore booked for an arteriogram. She has apparently monophasic signals in the dorsalis pedis pulses. She also of course has known severe chronic venous insufficiency with previous procedures as noted  previously. I had another long discussion with the patient today about her continuing to work 12 hour shifts. I've written her out for 2 months area had concerns about this as her work location is currently undergoing significant turmoil and this may lead to her termination. She is aware of this however I agree with her that she simply cannot continue to stand for  12 hours multiple days a week with the substantial wound areas she has. 10/19/15; the Dr. Doren Custard appointment was largely for an arteriogram which was normal. She does not have an arterial issue. He didn't make a comment about her chronic venous insufficiency for which she has had previous ablations. Presumably it was not felt that anything additional could be done. The patient is now out of work as I prescribed 2 weeks ago. Her wounds look somewhat less aggravated presumably because of this. I felt I would give debridement another try today 10/25/15; no major change in this patient's wounds. We are struggling to get her product that she can afford into her own home through her insurance. 11/01/15; no major change in the patient's wounds. I have been using silver alginate as the most affordable product. I spoke to Dr. Marla Roe last week with her requested take her to the OR for surgical debridement and placement of ACEL. Dr. Marla Roe told me that she would be willing to do this however [facility] will not cover this, fortunately the patient has [place] healthcare of some variant 11/08/15; no major change in the patient's wounds. She has been completely nonviable surface that this but is in too much pain with any attempted debridement are clinic. I have arranged for her to see Dr. Marla Roe ham of plastic surgery and this appointment is on Monday. I am hopeful that they will take her to the OR for debridement, possible ACEL ultimately possible skin graft 11/22/15 no major change in the patient's wounds over her bilateral medial  calcaneus medial malleolus into the lower legs. Surface on these does not look too bad however on the left there is surrounding erythema and tenderness. This may be cellulitis or could him sleepy tinea. 11/29/15; no major changes in the patient's wounds over her bilateral medial malleolus. There is no infection here and I don't think any additional antibiotics are necessary. There is now plan to move forward. She sees Dr. Marla Roe in a week's time for preparation for operative debridement and ACEL placement I believe on 7/12. She then has a follow-up appointment with Dr. Marla Roe on 7/21 12/28/15; the patient returns today having been taken to the [place] by Dr. Marla Roe 12/12/15 she underwent debridement, intraoperative cultures [which were negative]. She had placement of a wound VAC. Parent really ACEL was not available to be placed. The wound VAC foam apparently adhered to the wound since then she's been using silver alginate, Xeroform under Ace wraps. She still says there is a lot of drainage and a lot of pain 01/31/16; this is a patient I see monthly. I had referred her to Dr. Marla Roe him of plastic surgery for large wounds on her bilateral medial ankles. She has been to the OR twice once in early July and once in early August. She tells me over the last 3 weeks she has been using the wound VAC with ACEL underneath it. On the right we've simply been using silver alginate. Under Kerlix Coban wraps. 02/28/16; this is a patient I'm currently seeing monthly. She is gone on to have a skin graft over her large venous insufficiency ulcer on the left medial ankle. This was done by Dr. Marla Roe him. The patient is a bit perturbed about why she didn't have one on her right medial ankle wound. She has been using silver alginate to this. 03/06/16; I received a phone call from her plastic surgery Dr. Marla Roe. She expressed some concern about the viability of the skin  graft she did on the left medial  ankle wound. Asked me to place Endoform on this. She told me she is not planning to do a subsequent skin graft on the right as the left one did not take very well. I had placed Hydrofera Blue on the right 03/13/16; continue to have a reasonably healthy wound on the right medial ankle. Down to 3 mm in terms of size. There is epithelialization here. The area on the left medial ankle is her skin graft site. I suppose the last week this looks somewhat better. She has an open area inferiorly however in the center there appears to be some viable tissue. There is a lot of surface callus and eschar that will eventually need to come off however none of this looked to be infected. Patient states that the is able to keep the dressing on for several days which is an improvement. 03/20/16 no major change in the circumference of either wound however on the left side the patient was at Dr. Eusebio Friendly office and they did a debridement of left wound. 50% of the wound seems to be epithelialized. I been using Endoform on the left Hydrofera Blue in the right 03/27/16; she arrives today with her wound is not looking as healthy as they did last week. The area on the right clearly has an adherent surface to this a very similar surface on the left. Unfortunately for this patient this is all too familiar problem. Clearly the Endoform is not working and will need to change that today that has some potential to help this surface. She does not tolerate debridement in this clinic very well. She is changing the dressing wants 04/03/16; patient arrives with the wounds looking somewhat better especially on the right. Dr. Migdalia Dk change the dressing to silver alginate when she saw her on Monday and also sold her some compression socks. The usefulness of the latter is really not clear and woman with severely draining wounds. 04/10/16; the patient is doing a bit of an experiment wearing the compression stockings that Dr. Migdalia Dk  provided her to her left leg and the out of legs based dressings that we provided to the right. 05/01/16; the patient is continuing to wear compression stockings Dr. Migdalia Dk provided her on the left that are apparently silver impregnated. She has been using Iodoflex to the right leg wound. Still a moderate amount of drainage, when she leaves here the wraps only last for 4 days. She has to change the stocking on the left leg every night 05/15/16; she is now using compression stockings bilaterally provided by Dr. Marla Roe. She is wearing a nonadherent layer over the wounds so really I don't think there is anything specific being done to this now. She has some reduction on the left wound. The right is stable. I think all healing here is being done without a specific dressing 06/09/16; patient arrives here today with not much change in the wound certainly in diameter to large circular wounds over the medial aspect of her ankle bilaterally. Under the light of these services are certainly not viable for healing. There is no evidence of surrounding infection. She is wearing compression stockings with some sort of silver impregnation as prescribed by Dr. Marla Roe. She has a follow-up with her tomorrow. 06/30/16; no major change in the size or condition of her wounds. These are still probably covered with a nonviable surface. She is using only her purchase stockings. She did see Dr. Marla Roe who seemed to want to  apply Dakin's solution to this I'm not extreme short what value this would be. I would suggest Iodoflex which she still has at home. 07/28/16; I follow Mrs. Sautter episodically along with Dr. Marla Roe. 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 She has been applying a topical collagen ointment to both wounds with Adaptic. I don't think Dr. Marla Roe is planning to take her back to the OR. 08/19/16; I follow Mrs. Jeneen Rinks on a monthly  basis along with Dr. Marla Roe of plastic surgery. 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 I been following her for a number of years. At one point I was able to get the right medial malleolus wound to heal and had the left medial malleolus down to about half its current size however and I had to send her to plastic surgery for an operative debridement. Since then things have been stable to slightly improve the area on the right is slightly better one in the left about the same although there is much less adherent surface than I'm used to with this patient. She is using some form of liquid collagen gel that Dr. Marla Roe provided a Kerlix cover with the patient's own pressure stockings. She tells me that she has extreme pain in both ankles and along the lateral aspect of both feet. She has been unable to work for some period of time. She is telling me she is retiring at the beginning of April. She sees Dr. Doran Durand of orthopedics next week 09/22/16; patient has not seen Dr. Marla Roe since the last time she is here. I'm not really sure what she is using to the wounds other than bits and pieces of think she had left over including most recently Hydrofera Blue. She is using juxtalite stockings. She is having difficulty with her husband's recent illness "stroke". She is having to transport him to various doctors appointments. Dr. Marla Roe left her the option of a repeat debridement with ACEL however she has not been able to get the time to follow-up on this. She continues to have a fair amount of drainage out of these wounds with certainly precludes leaving dressings on all week 10/13/16; patient has not seen Dr. Marla Roe since she was last in our clinic. I'm not really sure what she is doing with the wounds, we did try to get her [facility] and I think she is actually using this most of the time. Because of drainage she  states she has to change this every second day although this is an improvement from what she used to do. She went to see Dr. Doran Durand who did not think she had a muscular issue with regards to her feet, he referred her to a neurologist and I think the appointment is sometime in June. I changed her back to Iodoflex which she has used in the past but not recently. 11/03/16; the patient has been using Iodoflex although she ran out of this. Still claims that there is a lot of drainage although the wound does not look like this. No surrounding erythema. She has not been back to see Dr. Marla Roe 11/24/16; the patient has been using Iodoflex again but she ran out of it 2 or 3 days ago. There is no major change in the condition of either one of these wounds in fact they are larger and covered in a thick adherent surface slough/nonviable tissue especially on the left. She  does not tolerate mechanical debridement in our clinic. Going back to see Dr. Marla Roe of plastic surgery for an operative debridement would seem reasonable. 12/15/16; the patient has not been back to see Dr. Marla Roe. She is been dealing with a series of illnesses and her husband which of monopolized her time. She is been using Sorbact which we largely supplied. She states the drainage is bad enough that it maximum she can go 2-3 days without changing the dressing 01/12/2017 -- the patient has not been back for about 4 weeks and has not seen Dr. Marla Roe not does she have any appointment pending. 01/23/17; patient has not seen Dr. Marla Roe even though I suggested this previously. She is using Santyl that was suggested last week by Dr. Con Memos this Cost her $16 through her insurance which is indeed surprising 02/12/17; continuing Santyl and the patient is changing this daily. A lot of drainage. She has not been back to see plastic surgery she is using an Ace wrap. Our intake nurse suggested wrap around stockings which would make a good  reasonable alternative 02/26/17; patient is been using Santyl and changing this daily due to drainage. She has not been to see plastic surgery she uses in April Ace wrap to control the edema. She did obtain extremitease stockings but stated that the edema in her leg was to big for these 03/20/17; patient is using Santyl and Anasept. Surfaces looked better today the area on the right is actually measuring a little smaller. She has states she has a lot of pain in her feet and ankles and is asking for a consult to pain control which I'll try to help her with through our case manager. 04/10/17; the patient arrives with better-looking wound surfaces and is slightly smaller wound on the left she is using a combination of Santyl and Anasept. She has an appointment or at least as started in the pain control center associated with [facility] 05/14/17; this is a patient who I followed for a prolonged period of time. 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 At one point I had this down to a much smaller wound on the left however these reopened and we've never been able to get these to heal. She has been using Santyl and Anasept gel although 2 weeks ago she ran out of the Anasept gel. She has a stable appearance of the wound. She is going to the wound care clinic at [facility]. They wanted do a nerve block/spinal block although she tells me she is reluctant to go forward with that. 05/21/17; this is a patient I have followed for many years. 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 Chronic pain and deformity in her ankles as well. She is been to see plastic surgery as well as orthopedics. Using PolyMem AG most recently/Kerramax/ABDs and 2 layer compression. She has managed to keep this on and she is coming in for a nurse check to change the dressing on Tuesdays, we see her on Fridays 06/05/17; really quite a good looking surface and the area  especially on the right medial has contracted in terms of dimensions. Well granulated healthy-looking tissue on both sides. Even with an open curet there is nothing that even feels abnormal here. This is as good as I've seen this in quite some time. We have been using PolyMem AG and bringing her in for a nurse check 06/12/17; really quite good surface on both of these wounds. The right medial  has contracted a bit left is not. We've been using PolyMem and AG and she is coming in for a nurse visit 06/19/17; we have been using PolyMem AG and bringing her in for a nurse check. Dimensions of her wounds are not better but the surfaces looked better bilaterally. She complained of bleeding last night and the left wound and increasing pain bilaterally. She states her wound pain is more neuropathic than just the wounds. There was some suggestion that this was radicular from her pain management doctor in talking to her it is really difficult to sort this out. 06/26/17; using PolyMem and AG and bringing her in for a nurse check as All of this and reasonably stable condition. Certainly not improved. The dimensions on the lateral part of the right leg look better but not really measuring better. The medial aspect on the left is about the same. 07/03/16; we have been using PolyMen AG and bringing her in for a nurse check to change the dressings as the wounds have drainage which precludes once weekly changing. We are using all secondary absorptive dressings.our intake nurse is brought up the idea of using a wound VAC/snap VAC on the wound to help with the drainage to see if this would result in some contraction. This is not a bad idea. The area on the right medial is actually looking smaller. Both wounds have a reasonable-looking surface. There is no evidence of cellulitis. The edema is well controlled 07/10/17; the patient was denied for a snap VAC by her insurance. The major issue with these wounds continues to be  drainage. We are using wicked PolyMem AG and she is coming in for a nurse visit to change this. The wounds are stable to slightly improved. The surface looks vibrant and the area on the right certainly has shrunk in size but very slowly 07/17/17; the patient still has large wounds on her bilateral medial malleoli. Surface of both of these wounds looks better. The dimensions seem to come and go but no consistent improvement. There is no epithelialization. We do not have options for advanced treatment products due to insurance issues. They did not approve of the wound VAC to help control the drainage. More recently we've been using PolyMem and AG wicked to allow drainage through. We have been bringing her in for a nurse visit to change this. We do not have a lot of options for wound care products and the home again due to insurance issues 07/24/17; the patient's wound actually looks somewhat better today. No drainage measurements are smaller still healthy-looking surface. We used silver collagen under PolyMen started last week. We have been bringing her in for a dressing change 07/31/17; patient's wound surface continued to look better and I think there is visible change in the dimensions of the wound on the right. Rims of epithelialization. We have been using silver collagen under PolyMen and bringing her in for a dressing change. There appears to be less drainage although she is still in need of the dressing change 08/07/17. Patient's wound surface continues to look better on both sides and the area on the right is definitely smaller. We have been using silver collagen and PolyMen. She feels that the drainage has been it has been better. I asked her about her vascular status. She went to see Dr. Aleda Grana at [facility] and had some form of ablation. I don't have much detail on this. I haven't my notes from 2016 that she was not a candidate for  any further ablation but I don't have any more  information on this. We had referred her to vein and vascular I don't think she ever went. He does not have a history of PAD although I don't have any information on this either. We don't even have ABIs in our record 08/14/17; we've been using silver collagen and PolyMen cover. And putting the patient and compression. She we are bringing her in as a nurse visit to change this because ofarge amount of drainage. We didn't the ABIs in clinic today since they had been done in many moons 1.2 bilaterally. She has been to see vein and vascular however this was at [facility] and she had ablation although I really don't have any information on this all seemed biking get a report. She is also been operatively debrided by plastic surgery and had a cell placed probably 8-12 months ago. This didn't have a major effect. We've been making some gains with current dressings 08/19/17-She is here in follow-up evaluation for bilateral medial malleoli ulcers. She continues to tolerate debridement very poorly. We will continue with recently changed topical treatment; if no significant improvement may consider switching to Iodosorb/Iodoflex. 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 She has been using silver collagen and PolyMem. I believe she has been used and tried on Iodoflex before. 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 These have been very refractory over time. We have been using silver collagen and PolyMen. She is coming in once a week for a doctor's and once a week for nurse visits. We are actually making some progress 09/18/17; the patient's wounds are smaller especially on the right medial. She arrives today to upset to consider even washing these off with Anasept  which I think is been part of the reason this is been closing. We've been using collagen covered in PolyMen otherwise. It is noted that she has a small area of folliculitis on the right medial calf that. As we are wrapping her legs I'll give her a short course of doxycycline to make sure this doesn't amount to anything. She is a long list of complaints today including imbalance, shortness of breath on exertion, inversion of her left ankle. With regards to the latter complaints she is been to see orthopedics and they offered her a tendon release surgery I believe but wanted her wounds to be closed first. I have recommended she go see her primary physician with regards to everything else. 09/25/17; patient's wounds are about the same size. We have made some progress bilaterally although not in recent weeks. She will not allow me T wash these wounds with Anasept even if she is doing her cell. Wheeze we've been using collagen covered in PolyMen. Last week she had a small area of folliculitis this is now opened into a small wound. She completed 5 days of trimethoprim sulfamethoxazole 10/02/17; unfortunately the area on her left medial ankle is worse with a larger wound area towards the Achilles. The patient complains of a lot of pain. She will not allow debridement although visually I don't think there is anything to debridement in any case. We have been using silver collagen and PolyMen for several months now. Initially we are making some progress although I'm not really seeing that today. We  will move back to [facility]. His admittedly this is a bit of a repeat however I'm hoping that his situation is different now. The patient tells me she had her leg on the left give out on her yesterday this is process some pain. 10/09/17; the patient is seen twice a week largely because of drainage issues coming out of the chronic medial bimalleolar wounds that are chronic. Last week the dimensions of the one on  the left looks a little larger I changed her to [facility]. She comes in today with a history of terrible pain in the bilateral wound areas. She will not allow debridement. She will not even allow a tissue culture. There is no surrounding erythema no no evidence of cellulitis. We have been putting her Kerlix Coban man. She will not allow more aggressive compression as there was a suggestion to put her in 3 layer wraps. 10/16/17; large wounds on her bilateral medial malleoli. These are chronic. Not much change from last week. The surface looks have healthy but absolutely no epithelialization. A lot of pain little less so of drainage. She will not allow debridement or even washing these off in the vigorous fashion with Anasept. 10/23/17; large wounds on her bilateral malleoli which are chronic. Some improvement in terms of size perhaps on the right since last time I saw these. She states that after we increased the 3 layer compression there was some bleeding, when she came in for a nurse visit she did not want 3 layer compression put back on about our nurse managed to convince her. She has known chronic venous visit issues and I'm hoping to get her to tolerate the 3 layer compression. using Hydrofera Blue 10/30/17; absolutely no change in the condition of either wound although we've had some improvement in dimensions on the right.. Attempted to put her in 3 layer compression she didn't tolerated she is back in 2 layer compression. We've been using Hydrofera Blue We looked over her past records. She had venous reflux studies in November 2016. There was no evidence of deep venous reflux on the right. Superficial vein did not show the greater saphenous vein at think this is been previously ablated the small saphenous vein was within normal limits. The left deep venous system showed no DVT the vessels were positive for deep venous reflux in the posterior tibial veins at the ankle. The greater saphenous  vein was surgically absent small saphenous vein was within normal limits. She went to vein and vascular at [facility]. I believe she had an ablation on the left greater saphenous vein. I'll update her reflux studies perhaps ever reviewed by vein and vascular. We've made absolutely no progress in these wounds. Will also try to read and TheraSkins through her insurance 11/06/17; W the patient apparently has a 2 week follow-up with vein and vascular I like him to review the whole issue with regards to her previous vascular workup by Dr. Aleda Grana. We've really made no progress on these wounds in many months. She arrives today with less viable looking surface on the left medial ankle wound. This was apparently looking about the same on Tuesday when she was here for nurse visit. 11/13/17; deep tissue culture I did last time of the left lower leg showed multiple organisms without any predominating. In particular no Staphylococcus or group A strep were isolated. We sent her for venous reflux studies. She's had a previous left greater saphenous vein stripping and I think sclerotherapy of the right  greater saphenous vein. She didn't really look at the lesser saphenous vein this both wounds are on the medial aspect. She has reflux in the common femoral vein and popliteal vein and an accessory vein on the right and the common femoral vein and popliteal vein on the left. I'm going to have her go to see vein and vascular just the look over things and see if anything else beside aggressive compression is indicated here. We have not been able to make any progress on these wounds in spite of the fact that the surface of the wounds is never look too bad. 11/20/17; no major change in the condition of the wounds. Patient reports a large amount of drainage. She has a lot of complaints of pain although enlisting her today I wonder if some of this at least his neuropathic rather than secondary to her wounds. She has  an appointment with vein and vascular on 12/30/17. The refractory nature of these wounds in my mind at least need vein and vascular to look over the wounds the recent reflux studies we did and her history to see if anything further can be done here. I also note her gait is deteriorated quite a bit. 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 She has a bilateral Trendelenburg gait. I wonder if this is neuropathic or perhaps multilevel radicular. 11/27/17; her wounds actually looks slightly better. Healthy-looking granulation tissue a scant amount of epithelialization. [place] [facility] will not pay for [employer]. They will play for tri layer Oasis and Dermagraft. This is not a diabetic ulcer. We'll try for the tri layer Oasis. She still complains of some drainage. She has a vein and vascular appointment on 12/30/17 12/04/17; the wounds visually look quite good. Healthy-looking granulation with some degree of epithelialization. We are still waiting for response to our request for trial to try layer Oasis. Her appointment with vascular to review venous and arterial issues isn't sold the end of July 7/31. Not allow debridement or even vigorous cleansing of the wound surface. 12/18/17; slightly smaller especially on the right. 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 We've been using Hydrofera Blue. We still are looking into triple layer Oasis through her insurance 01/08/18 on evaluation today patient's wound actually appears to be showing signs of good improvement at this point in time. She has been tolerating the dressing changes without complication. Fortunately there does not appear to be any evidence of infection at this point in time. We have been utilizing silver nitrate which does seem to be of benefit for her which is also good news. Overall I'm very happy with how things seem to be both regards appearance as well as measurement. Patient did see Dr. Bridgett Larsson for  evaluation on 12/30/17. In his assessment he felt that stripping would not likely add much more than chronic compression to the patient's healing process. His recommendation was to follow-up in three months with Dr. Doren Custard if she hasn't healed in order to consider referral back to you and see vascular where she previously was in a trial and was able to get her wound to heal. I'll be see what she feels she when you staying compression and he reiterated this as well. 01/13/18 on evaluation today patient appears to actually be doing very well in regard to her bilateral medial malleolus ulcers. She seems to have tolerated the chemical cauterization with silver nitrate last week she did have some pain through that evening but fortunately states that I'll  be see since it seems to be doing better she is overall pleased with the progress. 01/21/18; really quite a remarkable improvement since I've last seen these wounds. We started using silver nitrate specially on the islands of hyper granulation which for some reason her around the wound circumference. This is really done quite nicely. Primary dressing Hydrofera Blue under 4 layer compression. She seems to be able to hold out without a nurse rewrap. Follow-up in 1 week 01/28/18; we've continued the hydrofera blue but continued with chemical cauterization to the wound area that we started about a month ago for irregular hyper granulation. She is made almost stunning improvement in the overall wound dimensions. I was not really expecting this degree of improvement in these chronic wounds 02/05/18; we continue with Hydrofera Bluebut of also continued the aggressive chemical cauterization with silver nitrate. We made nice progress with the right greater than left wound. 02/12/18. We continued with Hydrofera Blue after aggressive chemical cauterization with silver nitrate. We appear to be making nice progress with both wound areas 02/19/2018; we continue with  [facility] after washing the wounds vigorously with Anasept spray and chemical cauterization with silver nitrate. We are making excellent progress. The area on the right's just about closed 02/26/2018. The area on the left medial ankle had too much necrotic debris today. I used a #5 curette we are able to get most of the soft. I continued with the silver nitrate to the much smaller wound on the right medial ankle she had a new area on her right lower pretibial area which she says was due to a role in her compression 03/05/2018; both wound areas look healthy. Not much change in dimensions from last week. I continue to use silver nitrate and Hydrofera Blue. The patient saw Dr. Doren Custard of vein and vascular. He felt she had venous stasis ulcers. He felt based on her previous arteriogram she should have adequate circulation for healing. Also she has deep venous reflux but really no significant correctable superficial venous reflux at this time. He felt we should continue with conservative management including leg elevation and compression 04/02/2018; since we last saw this woman about a month ago she had a fall apparently suffered a pelvic fracture. I did not look up the x-ray. Nevertheless because of pain she literally was bedbound for 2 weeks and had home health coming out to change the dressing. Somewhat predictably this is resulted in considerable improvement in both wound areas. The right is just about closed on the medial malleolus and the left is about half the size. 04/16/2018; both her wounds continue to go down in size. Using Hydrofera Blue. 05/07/18; both her wounds appeared to be improving especially on the right where it is almost closed. We are using Hydrofera Blue 05/14/2018; slightly worse this week with larger wounds. Surface on the left medial not quite as good. We have been using Hydrofera Blue 05/21/18; again the wounds are slightly larger. Left medial malleolus slightly larger with  eschar around the circumference. We have been using Hydrofera Blue undergoing a wraps for a prolonged period of time. This got a lot better when she was more recumbent due to a fall and a back injury. I change the primary dressing the silver alginate today. She did not tolerate a 4 layer compression previously although I may need to bring this up with her next time 05/28/2018; area on the left medial malleolus again is slightly larger with more drainage. Area on the right is roughly  unchanged. She has a small area of folliculitis on the right medial just on the lower calf. This does not look ominous. 06/03/2018 left medial malleolus slightly smaller in a better looking surface. We used silver nitrate on this last time with silver alginate. The area on the right appears slightly smaller 1/10; left medial malleolus slightly smaller. Small open area on the right. We used silver nitrate and silver alginate as of 2 weeks ago. We continue with the wound and compression. These got a lot better when she was off her feet 1/17; right medial malleolus wound is smaller. The left may be slightly smaller. Both surfaces look somewhat better. 1/24; both wounds are slightly smaller. Using silver alginate under Unna boots 1/31; both wounds appear smaller in fact the area on the right medial is just about closed. Surface eschar. We have been using silver alginate under Unna boots. The patient is less active now spends let much less time on her feet and I think this is contributed to the general improvement in the wound condition 2/7; both wounds appear smaller. I was hopeful the right medial would be closed however there there is still the same small open area. Slight amount of surface eschar on the left the dimensions are smaller there is eschar but the wound edges appear to be free. We have been using silver alginate under Unna boot's 2/14; both wounds once again measure smaller. Circumferential eschar on the left  medial. We have been using silver alginate under Unna boots with gradual improvement 2/21; the area on the right medial malleolus has healed. The area on the left is smaller. We have been using silver alginate and Unna boots. We can discharge wrapping the right leg she has 20/30 stockings at home she will need to protect the scar tissue in this area 2/28; the area on the right medial malleolus remains closed the patient has a compression stocking. The area on the left is smaller. We have been using silver alginate and Unna boots. 3/6 the area on the right medial ankle remains closed. Good edema control noted she is using her own compression stocking. The area on the left medial ankle is smaller. We have been managing this with silver alginate and Unna boots which we will continue today. 3/13; the area on the right medial ankle remains closed and I'm declaring it healed today. When necessary the left is about the same still a healthy-looking surface but no major change and wound area. No evidence of infection and using silver alginate under unna and generally making considerable improvement 3/27 the area on the right medial ankle remains closed the area on the left is about the same as last week. Certainly not any worse we have been using silver alginate under an Unna boot 4/3; the area on the right medial ankle remains closed per the patient. We did not look at this wound. The wound on the left medial ankle is about the same surface looks healthy we have been using silver alginate under an Unna boot 4/10; area on the right medial ankle remains closed per the patient. We did not look at this wound. The wound on the left medial ankle is slightly larger. The patient complains that the [facility] caused burning pain all week. She also told us that she was a lot more active this week. Changed her back to silver alginate 4/17; right medial ankle still closed per the patient. Left medial ankle is  slightly larger. Using silver alginate. She  did not tolerate Hydrofera Blue on this area 4/24; right medial ankle remains closed we have not look at this. The left medial ankle continues to get larger today by about a centimeter. We have been using silver alginate under Unna boots. She complains about 4 layer compression as an alternative. She has been up on her feet working on her garden 5/8; right medial ankle remains closed we did not look at this. The left medial ankle has increased in size about 100%. We have been using silver alginate under Unna boots. She noted increased pain this week and was not surprised that the wound is deteriorated 5/15; no major change in SA however much less erythema ( one week of doxy ocellulitis). 5/22-63 year old female returns at 1 week to the clinic for left medial ankle wound for which we have been using silver alginate under 3 layer compression She was placed on DOXY at last visit - the wound is wider at this visit. She is in 3 layer compression 5/29; change to [facility] last week. I had given her empiric doxycycline 2 weeks ago for a week. She is in 3 layer compression. She complains of a lot of pain and drainage on presentation today. 6/5; using Hydrofera Blue. I gave her doxycycline recently empirically for erythema and pain around the wound. Believe her cultures showed enterococcus which not would not have been well covered by doxycycline nevertheless the wound looks better and I don't feel specifically that the enterococcus needs to be covered. She has a new what looks like a wrap injury on her lateral left ankle. 6/12; she is using Hydrofera Blue. She has a new area on the left anterior lower tibial area. This was a wrap injury last week. 6/19; the patient is using Hydrofera Blue. She arrived with marked inflammation and erythema around the wound and tenderness. 12/01/18 on evaluation today patient appears to be doing a little bit better based on  what I'm hearing from the standpoint of lassos evaluation to this as far as the overall appearance of the wound is concerned. Then sometime substandard she typically sees Dr. Dellia Nims. Nonetheless overall very pleased with the progress that she's made up to this point. No fevers, chills, nausea, or vomiting noted at this time. 7/10; some improvement in the surface area. Aggressively debrided last week apparently. I went ahead with the debridement today although the patient does not tolerate this very well. We have been using Iodoflex. Still a fair amount of drainage 7/17; slightly smaller. Using Iodoflex. 7/24; no change from last week in terms of surface area. We have been using Iodoflex. Surface looks and continues to look somewhat better 7/31; surface area slightly smaller better looking surface. We have been using Iodoflex. This is under Unna boot compression 8/7-Patient presents at 1 week with Unna boot and Iodoflex, wound appears better 8/14-Patient presents at 1 week with Iodoflex, we use the Unna boot, wound appears to be stable better.Patient is getting Botox treatment for the inversion of the foot for tendon release, Next week 8/21; we are using Iodoflex. Unna boot. The wound is stable in terms of surface area. Under illumination there is some areas of the wound that appear to be either epithelialized or perhaps this is adherent slough at this point I was not really clear. It did not wipe off and I was reluctant to debride this today. 8/28; we are using Iodoflex in an Unna boot. Seems to be making good improvement. 9/4; using Iodoflex and wound is slightly  smaller. 9/18; we are using Iodoflex with topical silver nitrate when she is here. The wound continues to be smaller 10/2; patient missed her appointment last week due to GI issues. She left and Iodoflex based dressing on for 2 weeks. Wound is about the same size about the size of a dime on the left medial lower 10/9 we have been  using Iodoflex on the medial left ankle wound. She has a new superficial probable wrap injury on the dorsal left ankle 10/16; we have been using Hydrofera Blue since last week. This is on the left medial ankle 10/23; we have been using Hydrofera Blue since 2 weeks ago. This is on the left medial ankle. Dimensions are better 11/6; using Hydrofera Blue. I think the wound is smaller but still not closed. Left medial ankle 11/13; we have been using Hydrofera Blue. Wound is certainly no smaller this week. Also the surface not as good. This is the remanent of a very large area on her left medial ankle. 11/20; using Sorbact since last week. Wound was about the same in terms of size although I was disappointed about the surface debris Objective Constitutional Sitting or standing Blood Pressure is within target range for patient.. Pulse regular and within target range for patient.Marland Kitchen Respirations regular, non-labored and within target range.. Temperature is normal and within the target range for the patient.Marland Kitchen Appears in no distress. Vitals Time Taken: 10:03 AM, Height: 68 in, Weight: 127 lbs, BMI: 19.3, Temperature: 98 F, Pulse: 64 bpm, Respiratory Rate: 18 breaths/min, Blood Pressure: 117/34 mmHg. Eyes Conjunctivae clear. No discharge.no icterus. Respiratory work of breathing is normal. Cardiovascular No edema. Psychiatric appears at normal baseline. General Notes: Wound Exam; left medial malleolus. No improvement in surface area. Under illumination the wound bed did not have a healthy surface. Using a #3 curette debrided of tightly adherent fibrinous debris. No evidence of infection. No erythema around the wound Integumentary (Hair, Skin) no surrounding erythema. Wound #3 status is Open. Original cause of wound was Gradually Appeared. The wound is located on the Left,Medial Malleolus. The wound measures 1.4cm length x 1.3cm width x 0.1cm depth; 1.429cm^2 area and 0.143cm^3 volume. There is  Fat Layer (Subcutaneous Tissue) Exposed exposed. There is no tunneling or undermining noted. There is a medium amount of serosanguineous drainage noted. The wound margin is flat and intact. There is large (67-100%) red, pink granulation within the wound bed. There is a small (1-33%) amount of necrotic tissue within the wound bed including Adherent Slough. 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 There was a Excisional Skin/Subcutaneous Tissue Debridement with a total area of 1.96 sq cm performed by [name]., MD. With the following instrument(s): Curette to remove Viable and Non-Viable tissue/material. Material removed includes Subcutaneous Tissue, Slough, and Fibrin/Exudate after achieving pain control using Other (benzocaine, 20%). No specimens were taken. A time out was conducted at 10:58, prior to the start of the procedure. A Minimum amount of bleeding was controlled with Pressure. The procedure was tolerated well with a pain level of 2 throughout and a pain level of 0 following the procedure. Post Debridement Measurements: 1.4cm length x 1.3cm width x 0.1cm depth; 0.143cm^3 volume. Character of Wound/Ulcer Post Debridement is improved. Severity  of Tissue Post Debridement is: Fat layer exposed. Post procedure Diagnosis Wound #3: Same as Pre-Procedure Pre-procedure diagnosis of Wound #3 is a Venous Leg Ulcer located on the Left,Medial Malleolus . There was a Haematologist Compression Therapy Procedure by Deon Pilling, RN. Post procedure Diagnosis Wound #3: Same as Pre-Procedure Plan Follow-up Appointments: Nurse Visit: - next wednesday Dressing Change Frequency: Wound #3  Left,Medial Malleolus: Do not change entire dressing for one week. - change dressing in one week Skin Barriers/Peri-Wound Care: Moisturizing lotion TCA Cream or Ointment - mixed with lotion Wound Cleansing: Clean wound with Wound Cleanser - clean with anasept with dressing changes May shower with protection. Primary Wound Dressing: Wound #3 Left,Medial Malleolus: Other: - Sorbact. Apply foam to shin for protection Secondary Dressing: Wound #3 Left,Medial Malleolus: Dry Gauze Kerramax Edema Control: Unna Boot to Left Lower Extremity - no kerlix layer Avoid standing for long periods of time Elevate legs to the level of the heart or above for 30 minutes daily and/or when sitting, a frequency of: - throughout the day Support Garment 20-30 mm/Hg pressure to: - compression stocking right leg daily 1. Still with the Sorbact under an Haematologist. 2. She is going to change this herself next week we will see her again in 2 weeks Electronic Signature(s) Signed: 04/22/2019 6:05:38 PM By: Linton Ham MD Entered By: Linton Ham on 04/22/2019 12:33:10 -------------------------------------------------------------------------------- SuperBill Details Patient Name: Date of Service: [name] 04/22/2019 Medical Record [id number] Patient Account Number: [id number] Date of Birth/Sex: Treating RN: 1956-02-16 (62 y.o. 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

## 2019-04-27 ENCOUNTER — Encounter (HOSPITAL_BASED_OUTPATIENT_CLINIC_OR_DEPARTMENT_OTHER): Payer: Medicare Other

## 2019-04-27 ENCOUNTER — Other Ambulatory Visit: Payer: Self-pay

## 2019-04-27 DIAGNOSIS — L97321 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 breakdown of skin: Secondary | ICD-10-CM | POA: Diagnosis not present

## 2019-04-27 NOTE — Progress Notes (Signed)
JERICHO, CADDICK ([id number]) Visit Report for 04/27/2019 SuperBill Details Patient Name: Date of Service: CHASTIN, FARINHA 04/27/2019 Medical Record [id number] Patient Account Number: [id number] Date of Birth/Sex: Treating RN: 19-Jan-1956 (63 y.o. 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

## 2019-04-27 NOTE — Progress Notes (Signed)
[name], [name] ([id number]) Visit Report for 04/27/2019 Arrival Information Details Patient Name: Date of Service: [name], [name] 04/27/2019 2:45 PM Medical Record [id number] Patient Account Number: [id number] Date of Birth/Sex: Treating RN: September 06, 1955 (63 y.o. [name], Meta.Reding Primary Care Haakon Titsworth: [name] Other Clinician: Referring Livingston Denner: Treating Alexa Blish/Extender:Stone III, [place] in Treatment: 49 Visit Information History Since Last Visit Cane Added or deleted any medications: No Patient Arrived: 13:44 Any new allergies or adverse reactions: No Arrival Time: Had a fall or experienced change in No Accompanied By: self None activities of daily living that may affect Transfer Assistance: risk of falls: Patient Identification Verified: Yes Signs or symptoms of abuse/neglect since last No Secondary Verification Process Completed: Yes visito Patient Requires Transmission-Based No Hospitalized since last visit: No Precautions: Implantable device outside of the clinic excluding No Patient Has Alerts: No cellular tissue based products placed in the center since last visit: Has Dressing in Place as Prescribed: Yes Has Compression in Place as Prescribed: Yes Pain Present Now: No Electronic Signature(s) Signed: 04/27/2019 3:16:44 PM By: Deon Pilling Entered By: Deon Pilling on 04/27/2019 13:45:44 -------------------------------------------------------------------------------- Compression Therapy Details Patient Name: Date of Service: [name], [name] 04/27/2019 2:45 PM Medical Record [id number] Patient Account Number: [id number] Date of Birth/Sex: Treating RN: 21-Jun-1955 (63 y.o. [name] Primary Care Edem Tiegs: [name] Other Clinician: Referring Allecia Bells: Treating Kaylin Marcon/Extender:Stone III, Wyonia Hough, Noelle Weeks in Treatment: 332 Compression Therapy Performed for Wound Wound #3 Left,Medial  Malleolus Assessment: Performed By: Clinician Deon Pilling, RN Compression Type: Rolena Infante Pre Treatment ABI: 1.2 Electronic Signature(s) Signed: 04/27/2019 3:16:44 PM By: Deon Pilling Entered By: Deon Pilling on 04/27/2019 13:47:06 -------------------------------------------------------------------------------- Encounter Discharge Information Details Patient Name: Date of Service: [name] 04/27/2019 2:45 PM Medical Record [id number] Patient Account Number: [id number] Date of Birth/Sex: Treating RN: June 10, 1955 (63 y.o. [name] Primary Care Layne Lebon: [name] Other Clinician: Referring Shakim Faith: Treating Holland Kotter/Extender:Stone III, Garnett [place] in Treatment: [phone] Encounter Discharge Information Items Discharge Condition: Stable Ambulatory Status: Cane Discharge Destination: Home Transportation: Private Auto Accompanied By: self Schedule Follow-up Appointment: Yes Clinical Summary of Care: Electronic Signature(s) Signed: 04/27/2019 3:16:44 PM By: Deon Pilling Entered By: Deon Pilling on 04/27/2019 13:59:41 -------------------------------------------------------------------------------- Patient/Caregiver Education Details Patient Name: Date of Service: [name] 11/25/2020andnbsp2:45 PM Medical Record [id number] Patient Account Number: [id number] Date of Birth/Gender: Treating RN: [birth year] (63 y.o. [name] Primary Care Physician: [name] Other Clinician: Referring Physician: Treating Physician/Extender:Stone III, [place] in Treatment: [phone] Education Assessment Education Provided To: Patient Education Topics Provided Wound/Skin Impairment: Handouts: Caring for Your Ulcer Methods: Explain/Verbal Responses: Reinforcements needed Electronic Signature(s) Signed: 04/27/2019 3:16:44 PM By: Deon Pilling Entered By: Deon Pilling on 04/27/2019  13:59:27 -------------------------------------------------------------------------------- Wound Assessment Details Patient Name: Date of Service: [name], [name] 04/27/2019 2:45 PM Medical Record [id number] Patient Account Number: [id number] Date of Birth/Sex: Treating RN: 05-22-1956 (63 y.o. [name] Primary Care Jazlyn Tippens: [name] Other Clinician: Referring Shaft Corigliano: Treating Jerrion Tabbert/Extender:Stone III, Wyonia Hough, Noelle Weeks in Treatment: 332 Wound Status Wound Number: 3 Primary Etiology: Venous Leg Ulcer Wound Location: Left, Medial Malleolus Wound Status: Open Wounding Event: Gradually Appeared Date Acquired: 11/15/2012 Weeks Of Treatment: 332 Clustered Wound: No Wound Measurements Length: (cm) 1.4 % Reduc Width: (cm) 1.3 % Reduc Depth: (cm) 0.1 Area: (cm) 1.429 Volume: (cm) 0.143 Wound Description Classification: Full Thickness Without Exposed Support Structures tion in Area: 54.5% tion in Volume: 77.2% Treatment Notes Wound #3 (Left, Medial Malleolus)  1. Cleanse With Soap and water 2. Periwound Care Barrier cream Moisturizing lotion TCA Ointment 3. Primary Dressing Applied Other primary dressing (specifiy in notes) 4. Secondary Dressing Dry Gauze Kerramax/Xtrasorb 6. Support Layer Teacher, English as a foreign language Boot Notes [place]. no kerlix. primary dressing sorbact. foam to anterior lower leg for protection. Electronic Signature(s) Signed: 04/27/2019 3:16:44 PM By: Deon Pilling Entered By: Deon Pilling on 04/27/2019 13:46:26 -------------------------------------------------------------------------------- Vitals Details Patient Name: Date of Service: [name] 04/27/2019 2:45 PM Medical Record [id number] Patient Account Number: [id number] Date of Birth/Sex: Treating RN: Jun 25, 1955 (63 y.o. [name] Primary Care Aizlynn Digilio: [name] Other Clinician: Referring Babbette Dalesandro: Treating Karem Farha/Extender:Stone III,  Wyonia Hough, Noelle Weeks in Treatment: 332 Vital Signs Time Taken: 13:55 Temperature (F): 97.9 Height (in): 68 Pulse (bpm): 60 Weight (lbs): 127 Respiratory Rate (breaths/min): 20 Body Mass Index (BMI): 19.3 Blood Pressure (mmHg): 115/40 Reference Range: 80 - 120 mg / dl Electronic Signature(s) Signed: 04/27/2019 3:16:44 PM By: Deon Pilling Entered By: Deon Pilling on 04/27/2019 13:58:07

## 2019-04-29 ENCOUNTER — Encounter (HOSPITAL_BASED_OUTPATIENT_CLINIC_OR_DEPARTMENT_OTHER): Payer: Medicare Other | Admitting: Internal Medicine

## 2019-05-04 ENCOUNTER — Encounter: Payer: Medicare Other | Admitting: Physical Medicine and Rehabilitation

## 2019-05-06 ENCOUNTER — Encounter (HOSPITAL_BASED_OUTPATIENT_CLINIC_OR_DEPARTMENT_OTHER): Payer: Medicare Other | Admitting: Internal Medicine

## 2019-05-10 NOTE — Progress Notes (Signed)
[name], [name] ([id number]) Visit Report for 03/18/2019 Arrival Information Details Patient Name: Date of Service: [name], [name] 03/18/2019 10:00 AM Medical Record [id number] Patient Account Number: [id number] Date of Birth/Sex: Treating RN: [birth year] (63 y.o. [name] Primary Care Shravan Salahuddin: [name] Other Clinician: [name] Referring Annalia Metzger: Treating Aizen Duval/Extender:Robson, Tawny Asal in Treatment: 327 Visit Information History Since Last Visit Cane Added or deleted any medications: No Patient Arrived: 10:10 Any new allergies or adverse reactions: No Arrival Time: Had a fall or experienced change in No Accompanied By: self None activities of daily living that may affect Transfer Assistance: risk of falls: Patient Identification Verified: Yes Signs or symptoms of abuse/neglect since last No Secondary Verification Process Completed: Yes visito Patient Requires Transmission-Based No Hospitalized since last visit: No Precautions: Implantable device outside of the clinic excluding No Patient Has Alerts: No cellular tissue based products placed in the center since last visit: Has Dressing in Place as Prescribed: Yes Pain Present Now: Yes Electronic Signature(s) Signed: 05/10/2019 3:02:15 PM By: [name] Entered By: [name] on 03/18/2019 10:11:11 -------------------------------------------------------------------------------- Compression Therapy Details Patient Name: Date of Service: [name], [name] 03/18/2019 10:00 AM Medical Record [id number] Patient Account Number: [id number] Date of Birth/Sex: Treating RN: 03-01-1956 (63 y.o. [name] Primary Care Anagabriela Jokerst: [name] Other Clinician: [name] Referring Leaman Abe: Treating Kieon Lawhorn/Extender:Robson, Tawny Asal in Treatment: 327 Compression Therapy Performed for Wound Wound #3 Left,Medial  Malleolus Assessment: Performed By: Clinician Levan Hurst, RN Compression Type: Rolena Infante Post Procedure Diagnosis Same as Pre-procedure Electronic Signature(s) Signed: 03/18/2019 6:00:55 PM By: Levan Hurst RN, BSN Entered By: Levan Hurst on 03/18/2019 11:04:04 -------------------------------------------------------------------------------- Encounter Discharge Information Details Patient Name: Date of Service: [name] 03/18/2019 10:00 AM Medical Record [id number] Patient Account Number: [id number] Date of Birth/Sex: Treating RN: 08/31/1955 (63 y.o. [name] Primary Care Blaze Sandin: [name] Other Clinician: [name] Referring Zyen Triggs: Treating Brandice Busser/Extender:Robson, Tawny Asal in Treatment: [phone] Encounter Discharge Information Items Post Procedure Vitals Discharge Condition: Stable Temperature (F): 98.6 Ambulatory Status: Cane Pulse (bpm): 58 Discharge Destination: Home Respiratory Rate (breaths/min): 17 Transportation: Private Auto Blood Pressure (mmHg): 128/39 Accompanied By: self Schedule Follow-up Appointment: Yes Clinical Summary of Care: Patient Declined Electronic Signature(s) Signed: 03/22/2019 6:16:57 PM By: Kela Millin Entered By: Kela Millin on 03/18/2019 11:27:50 -------------------------------------------------------------------------------- Lower Extremity Assessment Details Patient Name: Date of Service: [name], [name] 03/18/2019 10:00 AM Medical Record [id number] Patient Account Number: [id number] Date of Birth/Sex: Treating RN: [birth year] (63 y.o. [name] Primary Care Bailen Geffre: [name] Other Clinician: [name] Referring Calandria Mullings: Treating Nicolus Ose/Extender:Robson, Velva Harman, Essie Christine in Treatment: 327 Edema Assessment Assessed: [Left: No] [Right: No] Edema: [Left: N] [Right: o] Calf Left: Right: Point of Measurement: 33 cm  From Medial Instep 28 cm cm Ankle Left: Right: Point of Measurement: 12 cm From Medial Instep 21.5 cm cm Vascular Assessment Pulses: Dorsalis Pedis Palpable: [Left:Yes] Electronic Signature(s) Signed: 03/22/2019 6:16:57 PM By: Kela Millin Entered By: Kela Millin on 03/18/2019 10:41:32 -------------------------------------------------------------------------------- Multi Wound Chart Details Patient Name: Date of Service: [name] 03/18/2019 10:00 AM Medical Record [id number] Patient Account Number: [id number] Date of Birth/Sex: Treating RN: [birth year] (63 y.o. [name] Primary Care Keo Schirmer: [name] Other Clinician: [name] Referring Nyshaun Standage: Treating Wong Steadham/Extender:Robson, Tawny Asal in Treatment: 327 Vital Signs Height(in): 68 Pulse(bpm): 58 Weight(lbs): 127 Blood Pressure(mmHg): 128/39 Body Mass Index(BMI): 19 Temperature(F): 98.6 Respiratory 17 Rate(breaths/min): Photos: [11:No Photos] [3:No Photos] [N/A:N/A] Wound  Location: [11:Left Ankle - Anterior] [3:Left Malleolus - Medial] [N/A:N/A] Wounding Event: [11:Blister] [3:Gradually Appeared] [N/A:N/A] Primary Etiology: [11:Venous Leg Ulcer] [3:Venous Leg Ulcer] [N/A:N/A] Comorbid History: [11:Congestive Heart Failure, Peripheral Vascular Disease, Congestive Heart Failure, End Stage Renal Disease, Tobacco Use, Chronic Obstructive Pulmonary Disease (COPD), Type 1 Diabetes] [3:Congestive Heart Failure, Peripheral Vascular  Disease, Congestive Heart Failure, End Stage Renal Disease, Tobacco Use, Chronic Obstructive Pulmonary Disease (COPD), Type 1 Diabetes] [N/A:N/A] Date Acquired: [11:03/04/2019] [3:11/15/2012] [N/A:N/A] Weeks of Treatment: [11:1] [3:327] [N/A:N/A] Wound Status: [11:Healed - Epithelialized] [3:Open] [N/A:N/A] Measurements L x W x D 0x0x0 [3:1.1x1.5x0.1] [N/A:N/A] (cm) Area (cm) : [11:0] [3:1.296] [N/A:N/A] Volume (cm) : [11:0]  [3:0.13] [N/A:N/A] % Reduction in Area: [11:100.00%] [3:58.80%] [N/A:N/A] % Reduction in Volume: [11:100.00%] [3:79.30%] [N/A:N/A] Classification: [11:Full Thickness Without Exposed Support Structures Exposed Support Structures] [3:Full Thickness Without] [N/A:N/A] Exudate Amount: [11:None Present] [3:Medium] [N/A:N/A] Exudate Type: [11:N/A] [3:Serosanguineous] [N/A:N/A] Exudate Color: [11:N/A] [3:red, brown] [N/A:N/A] Wound Margin: [11:Distinct, outline attached Flat and Intact] [N/A:N/A] Granulation Amount: [11:None Present (0%)] [3:Large (67-100%)] [N/A:N/A] Granulation Quality: [11:N/A] [3:Pink] [N/A:N/A] Necrotic Amount: [11:None Present (0%)] [3:Small (1-33%)] [N/A:N/A] Exposed Structures: [11:Fascia: No Fat Layer (Subcutaneous Tissue) Exposed: Yes Tissue) Exposed: No Tendon: No Muscle: No Joint: No Bone: No] [3:Fat Layer (Subcutaneous N/A Fascia: No Tendon: No Muscle: No Joint: No Bone: No] Epithelialization: [11:Large (67-100%)] [3:Small (1-33%)] [N/A:N/A] Debridement: [11:N/A] [3:Debridement - Excisional N/A] Pre-procedure [11:N/A] [3:11:00] [N/A:N/A] Verification/Time Out Taken: Pain Control: [11:N/A] [3:Lidocaine 4% Topical Solution] [N/A:N/A] Tissue Debrided: [11:N/A] [3:Subcutaneous, Slough] [N/A:N/A] Level: [11:N/A] [3:Skin/Subcutaneous Tissue] [N/A:N/A] Debridement Area (sq cm):N/A [3:1.65] [N/A:N/A] Instrument: [11:N/A] [3:Curette] [N/A:N/A] Bleeding: [11:N/A] [3:Minimum] [N/A:N/A] Procedural Pain: [11:N/A] [3:0] [N/A:N/A] Post Procedural Pain: [11:N/A] [3:0] [N/A:N/A] Debridement Treatment N/A [3:Procedure was tolerated] [N/A:N/A] Response: [3:well] Post Debridement [11:N/A] [3:1.1x1.5x0.1] [N/A:N/A] Measurements L x W x D (cm) Post Debridement [11:N/A] [3:0.13] [N/A:N/A] Volume: (cm) Procedures Performed: N/A [3:Compression Therapy Debridement] [N/A:N/A] Treatment Notes Wound #3 (Left, Medial Malleolus) 1. Cleanse With Wound Cleanser Soap and water 2.  Periwound Care Moisturizing lotion TCA Cream 3. Primary Dressing Applied Hydrofera Blue 4. Secondary Dressing Dry Gauze Kerramax/Xtrasorb 6. Support Layer Teacher, English as a foreign language Boot Notes [place]. no kerlix Electronic Signature(s) Signed: 03/18/2019 5:33:39 PM By: Linton Ham MD Signed: 03/18/2019 6:00:55 PM By: Levan Hurst RN, BSN Entered By: Linton Ham on 03/18/2019 11:34:33 -------------------------------------------------------------------------------- Multi-Disciplinary Care Plan Details Patient Name: Date of Service: [name] 03/18/2019 10:00 AM Medical Record [id number] Patient Account Number: [id number] Date of Birth/Sex: Treating RN: 1955-12-15 (63 y.o. [name] Primary Care Taydem Cavagnaro: [name] Other Clinician: [name] Referring Canuto Kingston: Treating Shrihaan Porzio/Extender:Robson, Tawny Asal in Treatment: 327 Active Inactive Venous Leg Ulcer Nursing Diagnoses: Actual venous Insuffiency (use after diagnosis is confirmed) Goals: Patient will maintain optimal edema control Date Initiated: 09/04/2017 Target Resolution Date: 04/15/2019 Goal Status: Active Patient/caregiver will verbalize understanding of disease process and disease management Date Initiated: 08/03/2014 Date Inactivated: 08/28/2017 Target Resolution Date: 08/29/2017 Goal Status: Met Interventions: Compression as ordered Treatment Activities: Therapeutic compression applied : 09/21/2015 Notes: Wound/Skin Impairment Nursing Diagnoses: Impaired tissue integrity Knowledge deficit related to ulceration/compromised skin integrity Goals: Patient will have a decrease in wound volume by X% from date: (specify in notes) Date Initiated: 04/05/2015 Date Inactivated: 02/12/2017 Target Resolution Date: 09/01/2016 Unmet Reason: chronic, Goal Status: Unmet unable to tolerate stronger compression Patient/caregiver will verbalize understanding of skin care  regimen Date Initiated: 04/05/2015 Target Resolution Date: 04/15/2019 Goal Status: Active Interventions: Assess patient/caregiver ability to obtain necessary supplies Assess patient/caregiver ability to  perform ulcer/skin care regimen upon admission and as needed Assess ulceration(s) every visit Provide education on ulcer and skin care Notes: Electronic Signature(s) Signed: 03/18/2019 6:00:55 PM By: Levan Hurst RN, BSN Entered By: Levan Hurst on 03/18/2019 17:27:17 -------------------------------------------------------------------------------- Pain Assessment Details Patient Name: Date of Service: [name] 03/18/2019 10:00 AM Medical Record [id number] Patient Account Number: [id number] Date of Birth/Sex: Treating RN: [birth year] (63 y.o. [name] Primary Care Aliyana Dlugosz: [name] Other Clinician: [name] Referring Laqueisha Catalina: Treating Keeya Dyckman/Extender:Robson, Tawny Asal in Treatment: 327 Active Problems Location of Pain Severity and Description of Pain Patient Has Paino Yes Site Locations Rate the pain. Current Pain Level: 6 Pain Management and Medication Current Pain Management: Electronic Signature(s) Signed: 03/18/2019 6:00:55 PM By: Levan Hurst RN, BSN Signed: 05/10/2019 3:02:15 PM By: [name] Entered By: [name] on 03/18/2019 10:13:11 -------------------------------------------------------------------------------- Patient/Caregiver Education Details Patient Name: Date of Service: [name] 10/16/2020andnbsp10:00 AM Medical Record [id number] Patient Account Number: [id number] Date of Birth/Gender: Treating RN: 1956-03-06 (63 y.o. [name] Primary Care Physician: [name] Other Clinician: [name] Referring Physician: Treating Physician/Extender:Robson, Tawny Asal in Treatment: 55 Education Assessment Education Provided  To: Patient Education Topics Provided Wound/Skin Impairment: Methods: Explain/Verbal Responses: State content correctly [employer]) Signed: 03/18/2019 6:00:55 PM By: Levan Hurst RN, BSN Entered By: Levan Hurst on 03/18/2019 17:28:47 -------------------------------------------------------------------------------- Wound Assessment Details Patient Name: Date of Service: [name] 03/18/2019 10:00 AM Medical Record [id number] Patient Account Number: [id number] Date of Birth/Sex: Treating RN: 12/07/1955 (63 y.o. [name] Primary Care Habeeb Puertas: [name] Other Clinician: [name] Referring Leena Tiede: Treating Vitaliy Eisenhour/Extender:Robson, Velva Harman, Essie Christine in Treatment: 327 Wound Status Wound Number: 11 Primary Venous Leg Ulcer Etiology: Wound Location: Left Ankle - Anterior Wound Healed - Epithelialized Wounding Event: Blister Status: Date Acquired: 03/04/2019 Comorbid Congestive Heart Failure, Peripheral Vascular Weeks Of Treatment: 1 History: Disease, Congestive Heart Failure, End Stage Clustered Wound: No Renal Disease, Tobacco Use, Chronic Obstructive Pulmonary Disease (COPD), Type 1 Diabetes Photos Wound Measurements Length: (cm) 0 % Redu Width: (cm) 0 % Redu Depth: (cm) 0 Epithe Area: (cm) 0 Tunne Volume: (cm) 0 Under Wound Description Full Thickness Without Exposed Support Foul O Classification: Structures Slough Wound Distinct, outline attached Margin: Exudate None Present Amount: Wound Bed Granulation Amount: None Present (0%) Necrotic Amount: None Present (0%) Fascia Fat La Tendon Muscle Joint Bone E dor After Cleansing: No /Fibrino No Exposed Structure Exposed: No yer (Subcutaneous Tissue) Exposed: No Exposed: No Exposed: No Exposed: No xposed: No ction in Area: 100% ction in Volume: 100% lialization: Large (67-100%) ling: No mining: No Electronic Signature(s) Signed: 03/22/2019  4:20:24 PM By: Mikeal Hawthorne EMT/HBOT Signed: 03/23/2019 6:50:57 PM By: Levan Hurst RN, BSN Previous Signature: 03/18/2019 6:00:55 PM Version By: Levan Hurst RN, BSN Entered By: Mikeal Hawthorne on 03/22/2019 15:25:29 -------------------------------------------------------------------------------- Wound Assessment Details Patient Name: Date of Service: [name] 03/18/2019 10:00 AM Medical Record [id number] Patient Account Number: [id number] Date of Birth/Sex: Treating RN: Oct 13, 1955 (63 y.o. [name] Primary Care Renita Brocks: [name] Other Clinician: [name] Referring Senovia Gauer: Treating Burton Gahan/Extender:Robson, Velva Harman, Essie Christine in Treatment: 327 Wound Status Wound Number: 3 Primary Venous Leg Ulcer Etiology: Wound Location: Left Malleolus - Medial Wound Open Wounding Event: Gradually Appeared Status: Date Acquired: 11/15/2012 Comorbid Congestive Heart Failure, Peripheral Vascular Weeks Of Treatment: 327 History: Disease, Congestive Heart Failure, End Stage Clustered Wound: No Renal Disease, Tobacco Use, Chronic Obstructive Pulmonary Disease (COPD), Type 1 Diabetes  Photos Wound Measurements Length: (cm) 1.1 % Reduct Width: (cm) 1.5 % Reduct Depth: (cm) 0.1 Epitheli Area: (cm) 1.296 Tunneli Volume: (cm) 0.13 Undermi Wound Description Classification: Full Thickness Without Exposed Support Foul Odo Structures Slough/F Wound Flat and Intact Margin: Exudate Medium Amount: Exudate Serosanguineous Type: Exudate red, brown Color: Wound Bed Granulation Amount: Large (67-100%) Granulation Quality: Pink Fascia E Necrotic Amount: Small (1-33%) Fat Laye Necrotic Quality: Adherent Slough Tendon E Muscle E Joint Ex Bone Exp r After Cleansing: No ibrino Yes Exposed Structure xposed: No r (Subcutaneous Tissue) Exposed: Yes xposed: No xposed: No posed: No osed: No ion in Area: 58.8% ion in Volume:  79.3% alization: Small (1-33%) ng: No ning: No Electronic Signature(s) Signed: 03/23/2019 6:50:57 PM By: Levan Hurst RN, BSN Signed: 03/24/2019 4:25:07 PM By: Mikeal Hawthorne EMT/HBOT Previous Signature: 03/18/2019 6:00:55 PM Version By: Levan Hurst RN, BSN Previous Signature: 03/22/2019 6:16:57 PM Version By: Kela Millin Entered By: Mikeal Hawthorne on 03/23/2019 08:36:39 -------------------------------------------------------------------------------- [place] Details Patient Name: Date of Service: [name] 03/18/2019 10:00 AM Medical Record [id number] Patient Account Number: [id number] Date of Birth/Sex: Treating RN: 1955-09-04 (63 y.o. [name] Primary Care Dennis Killilea: [name] Other Clinician: [name] Referring Mystique Bjelland: Treating Ciaran Begay/Extender:Robson, Tawny Asal in Treatment: 327 Vital Signs Time Taken: 10:11 Temperature (F): 98.6 Height (in): 68 Pulse (bpm): 58 Weight (lbs): 127 Respiratory Rate (breaths/min): 17 Body Mass Index (BMI): 19.3 Blood Pressure (mmHg): 128/39 Reference Range: 80 - 120 mg / dl Electronic Signature(s) Signed: 05/10/2019 3:02:15 PM By: [name] Entered By: [name] on 03/18/2019 10:12:53

## 2019-05-10 NOTE — Progress Notes (Signed)
AEISHA, REUTOV ([id number]) Visit Report for 03/30/2019 Arrival Information Details Patient Name: Date of Service: [name] 03/30/2019 1:00 PM Medical Record [id number] Patient Account Number: [id number] Date of Birth/Sex: Treating RN: Sep 17, 1955 (63 y.o. F) Primary Care Anishka Bushard: [name] Other Clinician: Referring Dericka Ostenson: Treating Donnette Macmullen/Extender:Stone III, Wyonia Hough, Noelle Weeks in Treatment: 20 Visit Information History Since Last Visit Cane Added or deleted any medications: No Patient Arrived: 12:56 Any new allergies or adverse reactions: No Arrival Time: Had a fall or experienced change in No Accompanied By: self None activities of daily living that may affect Transfer Assistance: risk of falls: Patient Identification Verified: Yes Signs or symptoms of abuse/neglect since last No Secondary Verification Process Completed: Yes visito Patient Requires Transmission-Based No Hospitalized since last visit: No Precautions: Implantable device outside of the clinic excluding No Patient Has Alerts: No cellular tissue based products placed in the center since last visit: Has Dressing in Place as Prescribed: Yes Pain Present Now: No Electronic Signature(s) Signed: 05/10/2019 3:02:15 PM By: [name] Entered By: [name] on 03/30/2019 12:58:10 -------------------------------------------------------------------------------- Compression Therapy Details Patient Name: Date of Service: [name] 03/30/2019 1:00 PM Medical Record [id number] Patient Account Number: [id number] Date of Birth/Sex: Treating RN: 02-Nov-1955 (63 y.o. [name] Kc Summerson: [name] Other Clinician: Referring Zayveon Raschke: Treating Sai Moura/Extender:Stone III, [place] in Treatment: 328 Compression Therapy Performed for Wound Wound #3 Left,Medial Malleolus Assessment: Performed By: Clinician Baruch Gouty,  RN Compression Type: Rolena Infante Electronic Signature(s) Signed: 03/30/2019 1:37:03 PM By: Baruch Gouty RN, BSN Entered By: Baruch Gouty on 03/30/2019 13:25:51 -------------------------------------------------------------------------------- Encounter Discharge Information Details Patient Name: Date of Service: [name] 03/30/2019 1:00 PM Medical Record [id number] Patient Account Number: [id number] Date of Birth/Sex: Treating RN: November 11, 1955 (63 y.o. [name] Pryor Guettler: [name] Other Clinician: Referring Ester Mabe: Treating Holdyn Poyser/Extender:Stone III, [place] in Treatment: 328 Encounter Discharge Information Items Discharge Condition: Stable Ambulatory Status: Cane Discharge Destination: Home Transportation: Private Auto Accompanied By: self Schedule Follow-up Appointment: Yes Clinical Summary of Care: Patient Declined Electronic Signature(s) Signed: 03/30/2019 1:37:03 PM By: Baruch Gouty RN, BSN Entered By: Baruch Gouty on 03/30/2019 13:28:13 -------------------------------------------------------------------------------- Patient/Caregiver Education Details Patient Name: Date of Service: [name] 10/28/2020andnbsp1:00 PM Medical Record [id number] Patient Account Number: [id number] Date of Birth/Gender: Treating RN: [birth year] (63 y.o. [name] Physician: [name] Other Clinician: Referring Physician: Treating Physician/Extender:Stone III, Garnett [place] in Treatment: 76 Education Assessment Education Provided To: Patient Education Topics Provided Venous: Methods: Explain/Verbal Responses: Reinforcements needed, State content correctly Electronic Signature(s) Signed: 03/30/2019 1:37:03 PM By: Baruch Gouty RN, BSN Entered By: Baruch Gouty on 03/30/2019  13:27:47 -------------------------------------------------------------------------------- Wound Assessment Details Patient Name: Date of Service: [name] 03/30/2019 1:00 PM Medical Record [id number] Patient Account Number: [id number] Date of Birth/Sex: Treating RN: 11-11-1955 (63 y.o. [name] Arleth Mccullar: [name] Other Clinician: Referring Alma Mohiuddin: Treating Zerek Litsey/Extender:Stone III, Wyonia Hough, Noelle Weeks in Treatment: 328 Wound Status Wound Number: 3 Primary Venous Leg Ulcer Etiology: Wound Location: Left Malleolus - Medial Wound Open Wounding Event: Gradually Appeared Status: Date Acquired: 11/15/2012 Comorbid Congestive Heart Failure, Peripheral Vascular Weeks Of Treatment: 328 History: Disease, Congestive Heart Failure, End Clustered Wound: No Stage Renal Disease, Tobacco Use, Chronic Obstructive Pulmonary Disease (COPD), Type 1 Diabetes Wound Measurements Length: (cm) 0.9 % Reduc Width: (cm) 1.3 % Reduc Depth: (cm) 0.1 Epithel Area: (cm) 0.919 Tunnel Volume: (cm) 0.092 Underm Wound Description Classification:  Full Thickness Without Exposed Support Foul O Structures Slough Wound Flat and Intact Margin: Exudate Medium Amount: Exudate Serosanguineous Type: Exudate red, brown Color: Wound Bed Granulation Amount: Large (67-100%) Granulation Quality: Red, Pink Fascia Necrotic Amount: Small (1-33%) Fat Lay Necrotic Quality: Adherent Slough Tendon Muscle Joint E Bone Ex Electronic Signature(s) Signed: 03/30/2019 1:37:03 PM By: Baruch Gouty RN, BSN Entered By: Baruch Gouty on 10/28/ dor After Cleansing: No Lynita Lombard No Exposed Structure Exposed: No er (Subcutaneous Tissue) Exposed: Yes Exposed: No Exposed: No xposed: No posed: No 2020 13:25:14 tion in Area: 70.8% tion in Volume: 85.4% ialization: Small (1-33%) ing: No ining:  No -------------------------------------------------------------------------------- Vitals Details Patient Name: Date of Service: [name], [name] 03/30/2019 1:00 PM Medical Record [id number] Patient Account Number: [id number] Date of Birth/Sex: Treating RN: Apr 25, 1956 (63 y.o. F) Primary Care Lane Kjos: [name] Other Clinician: Referring Castle Lamons: Treating Taylie Helder/Extender:Stone III, Wyonia Hough, Noelle Weeks in Treatment: 328 Vital Signs Time Taken: 12:58 Temperature (F): 97.7 Height (in): 68 Pulse (bpm): 64 Weight (lbs): 127 Respiratory Rate (breaths/min): 17 Body Mass Index (BMI): 19.3 Blood Pressure (mmHg): 109/45 Reference Range: 80 - 120 mg / dl Electronic Signature(s) Signed: 05/10/2019 3:02:15 PM By: [name] Entered By: [name] on 03/30/2019 12:59:47

## 2019-05-10 NOTE — Progress Notes (Signed)
[name], [name] ([id number]) Visit Report for 01/28/2019 Arrival Information Details Patient Name: Date of Service: [name], [name] 01/28/2019 10:00 AM Medical Record [id number] Patient Account Number: [id number] Date of Birth/Sex: Treating RN: 04/28/1956 (63 y.o. [name] Primary Care Myron Lona: [name] Other Clinician: Referring Manami Tutor: Treating Chayna Surratt/Extender:Robson, Tawny Asal in Treatment: 320 Visit Information History Since Last Visit All ordered tests and consults were completed: No Patient Arrived: Ambulatory Added or deleted any medications: No Arrival Time: 10:34 Any new allergies or adverse reactions: No Accompanied By: self Had a fall or experienced change in No Transfer Assistance: None activities of daily living that may affect Patient Identification Verified: Yes risk of falls: Secondary Verification Process Completed: Yes Signs or symptoms of abuse/neglect since last No Patient Requires Transmission-Based No visito Precautions: Hospitalized since last visit: No Patient Has Alerts: No Implantable device outside of the clinic excluding No cellular tissue based products placed in the center since last visit: Has Dressing in Place as Prescribed: Yes Has Compression in Place as Prescribed: Yes Pain Present Now: No Electronic Signature(s) Signed: 05/10/2019 3:07:45 PM By: [name] RN Entered By: [name] on 01/28/2019 10:34:50 -------------------------------------------------------------------------------- Compression Therapy Details Patient Name: Date of Service: [name], [name] 01/28/2019 10:00 AM Medical Record [id number] Patient Account Number: [id number] Date of Birth/Sex: Treating RN: 1956/03/08 (63 y.o. [name] Daylin Gruszka: [name] Other Clinician: Referring Macon Lesesne: Treating Naly Schwanz/Extender:Robson, Tawny Asal in Treatment: 320 Compression Therapy  Performed for Wound Wound #3 Left,Medial Malleolus Assessment: Performed By: Clinician Deon Pilling, RN Compression Type: Rolena Infante Post Procedure Diagnosis Same as Pre-procedure Electronic Signature(s) Signed: 01/28/2019 5:31:07 PM By: Baruch Gouty RN, BSN Entered By: Baruch Gouty on 01/28/2019 10:55:10 -------------------------------------------------------------------------------- Encounter Discharge Information Details Patient Name: Date of Service: [name], [name] 01/28/2019 10:00 AM Medical Record [id number] Patient Account Number: [id number] Date of Birth/Sex: Treating RN: [birth year] (63 y.o. [name] Primary Care Chelli Yerkes: [name] Other Clinician: Referring Yurani Fettes: Treating Deontre Allsup/Extender:Robson, Tawny Asal in Treatment: 320 Encounter Discharge Information Items Discharge Condition: Stable Ambulatory Status: Cane Discharge Destination: Home Transportation: Private Auto Accompanied By: self Schedule Follow-up Appointment: Yes Clinical Summary of Care: Electronic Signature(s) Signed: 01/28/2019 5:44:22 PM By: Deon Pilling Entered By: Deon Pilling on 01/28/2019 12:56:53 -------------------------------------------------------------------------------- Lower Extremity Assessment Details Patient Name: Date of Service: [name], [name] 01/28/2019 10:00 AM Medical Record [id number] Patient Account Number: [id number] Date of Birth/Sex: Treating RN: 1956-04-30 (63 y.o. [name] Primary Care Christia Coaxum: [name] Other Clinician: Referring Alysah Carton: Treating Cynthie Garmon/Extender:Robson, Velva Harman, Essie Christine in Treatment: 320 Edema Assessment Assessed: [Left: No] [Right: No] Edema: [Left: N] [Right: o] Calf Left: Right: Point of Measurement: 33 cm From Medial Instep 30 cm cm Ankle Left: Right: Point of Measurement: 12 cm From Medial Instep 22 cm cm Electronic Signature(s) Signed: 05/10/2019 3:07:45 PM By:  [name] RN Entered By: [name] on 01/28/2019 10:36:11 -------------------------------------------------------------------------------- Multi Wound Chart Details Patient Name: Date of Service: [name], [name] 01/28/2019 10:00 AM Medical Record [id number] Patient Account Number: [id number] Date of Birth/Sex: Treating RN: 10/19/1955 (63 y.o. [name] Guerin Lashomb: [name] Other Clinician: Referring Luticia Tadros: Treating Jacori Mulrooney/Extender:Robson, Velva Harman, Essie Christine in Treatment: 320 Vital Signs Height(in): 68 Pulse(bpm): 70 Weight(lbs): 127 Blood Pressure(mmHg): 121/80 Body Mass Index(BMI): 19 Temperature(F): 98.1 Respiratory 18 Rate(breaths/min): Photos: [3:No Photos] [N/A:N/A] Wound Location: [3:Left Malleolus - Medial] [N/A:N/A] Wounding Event: [3:Gradually Appeared] [N/A:N/A] Primary Etiology: [3:Venous Leg Ulcer] [N/A:N/A] Comorbid History: [3:Congestive Heart Failure, Peripheral Vascular Disease, Congestive  Heart Failure, End Stage Renal Disease, Tobacco Use, Chronic Obstructive Pulmonary Disease (COPD), Type 1 Diabetes] [N/A:N/A] Date Acquired: [3:11/15/2012] [N/A:N/A] Weeks of Treatment: [3:320] [N/A:N/A] Wound Status: [3:Open] [N/A:N/A] Measurements L x W x D 2x1.9x0.1 [N/A:N/A] (cm) Area (cm) : [3:2.985] [N/A:N/A] Volume (cm) : [3:0.298] [N/A:N/A] % Reduction in Area: [3:5.00%] [N/A:N/A] % Reduction in Volume: 52.50% [N/A:N/A] Classification: [3:Full Thickness Without Exposed Support Structures] [N/A:N/A] Exudate Amount: [3:Medium] [N/A:N/A] Exudate Type: [3:Serosanguineous] [N/A:N/A] Exudate Color: [3:red, brown] [N/A:N/A] Wound Margin: [3:Flat and Intact] [N/A:N/A] Granulation Amount: [3:Large (67-100%)] [N/A:N/A] Granulation Quality: [3:Red, Pink] [N/A:N/A] Necrotic Amount: [3:Small (1-33%)] [N/A:N/A] Exposed Structures: [3:Fat Layer (Subcutaneous N/A Tissue) Exposed: Yes Fascia: No Tendon: No Muscle: No Joint:  No Bone: No] Epithelialization: [3:Small (1-33%) Compression Therapy] [N/A:N/A N/A] Treatment Notes Electronic Signature(s) Signed: 01/28/2019 5:25:25 PM By: Linton Ham MD Signed: 01/28/2019 5:31:07 PM By: Baruch Gouty RN, BSN Entered By: Linton Ham on 01/28/2019 11:17:34 -------------------------------------------------------------------------------- Multi-Disciplinary Care Plan Details Patient Name: Date of Service: [name], [name] 01/28/2019 10:00 AM Medical Record [id number] Patient Account Number: [id number] Date of Birth/Sex: Treating RN: 1956/03/16 (63 y.o. [name] Marki Frede: [name] Other Clinician: Referring Esiah Bazinet: Treating Sarinity Dicicco/Extender:Robson, Velva Harman, Essie Christine in Treatment: 320 Active Inactive Venous Leg Ulcer Nursing Diagnoses: Actual venous Insuffiency (use after diagnosis is confirmed) Goals: Patient will maintain optimal edema control Date Initiated: 09/04/2017 Target Resolution Date: 02/18/2019 Goal Status: Active Patient/caregiver will verbalize understanding of disease process and disease management Date Initiated: 08/03/2014 Date Inactivated: 08/28/2017 Target Resolution Date: 08/29/2017 Goal Status: Met Interventions: Compression as ordered Treatment Activities: Therapeutic compression applied : 09/21/2015 Notes: Wound/Skin Impairment Nursing Diagnoses: Impaired tissue integrity Knowledge deficit related to ulceration/compromised skin integrity Goals: Patient will have a decrease in wound volume by X% from date: (specify in notes) Date Initiated: 04/05/2015 Date Inactivated: 02/12/2017 Target Resolution Date: 09/01/2016 Unmet Reason: chronic, Goal Status: Unmet unable to tolerate stronger compression Patient/caregiver will verbalize understanding of skin care regimen Date Initiated: 04/05/2015 Target Resolution Date: 02/18/2019 Goal Status: Active Interventions: Assess patient/caregiver ability  to obtain necessary supplies Assess patient/caregiver ability to perform ulcer/skin care regimen upon admission and as needed Assess ulceration(s) every visit Provide education on ulcer and skin care Notes: Electronic Signature(s) Signed: 01/28/2019 5:31:07 PM By: Baruch Gouty RN, BSN Entered By: Baruch Gouty on 01/28/2019 10:54:04 -------------------------------------------------------------------------------- Pain Assessment Details Patient Name: Date of Service: [name], [name] 01/28/2019 10:00 AM Medical Record [id number] Patient Account Number: [id number] Date of Birth/Sex: Treating RN: 02-28-1956 (63 y.o. [name] Primary Care Nataly Pacifico: [name] Other Clinician: Referring Jazz Rogala: Treating Purcell Jungbluth/Extender:Robson, Velva Harman, Essie Christine in Treatment: 320 Active Problems Location of Pain Severity and Description of Pain Patient Has Paino No Site Locations Pain Management and Medication Current Pain Management: Electronic Signature(s) Signed: 05/10/2019 3:07:45 PM By: [name] RN Entered By: [name] on 01/28/2019 10:35:33 -------------------------------------------------------------------------------- Patient/Caregiver Education Details Patient Name: Date of Service: [name], [name] 8/28/2020andnbsp10:00 AM Medical Record [id number] Patient Account Number: [id number] Date of Birth/Gender: [birth year] (62 y.o. F) Treating RN: Baruch Gouty Primary Care Physician: [name] Other Clinician: Referring Physician: Treating Physician/Extender:Robson, Tawny Asal in Treatment: [place] Education Assessment Education Provided To: Patient Education Topics Provided Venous: Methods: Explain/Verbal Responses: Reinforcements needed, State content correctly Wound/Skin Impairment: Methods: Explain/Verbal Responses: Reinforcements needed, State content correctly Electronic Signature(s) Signed: 01/28/2019 5:31:07 PM By:  Baruch Gouty RN, BSN Entered By: Baruch Gouty on 01/28/2019 10:54:33 -------------------------------------------------------------------------------- Wound Assessment Details Patient Name: Date of Service: [name], [name] 01/28/2019 10:00 AM Medical Record [id number]  Patient Account Number: [id number] Date of Birth/Sex: Treating RN: 02-Apr-1956 (63 y.o. [name] Primary Care Bodin Gorka: [name] Other Clinician: Referring Kista Robb: Treating Luverta Korte/Extender:Robson, Velva Harman, Essie Christine in Treatment: 320 Wound Status Wound Number: 3 Primary Venous Leg Ulcer Etiology: Wound Location: Left Malleolus - Medial Wound Open Wounding Event: Gradually Appeared Status: Date Acquired: 11/15/2012 Comorbid Congestive Heart Failure, Peripheral Vascular Weeks Of Treatment: 320 History: Disease, Congestive Heart Failure, End Stage Clustered Wound: No Renal Disease, Tobacco Use, Chronic Obstructive Pulmonary Disease (COPD), Type 1 Diabetes Photos Photo Uploaded By: Marlou Sa on 02/01/2019 09:06:50 Wound Measurements Length: (cm) 2 % Reduc Width: (cm) 1.9 % Reduc Depth: (cm) 0.1 Epithel Area: (cm) 2.985 Tunnel Volume: (cm) 0.298 Underm Wound Description Classification: Full Thickness Without Exposed Support Foul O Structures Slough Wound Flat and Intact Margin: Exudate Medium Amount: Exudate Serosanguineous Type: Exudate red, brown Color: Wound Bed Granulation Amount: Large (67-100%) Granulation Quality: Red, Pink Fascia Necrotic Amount: Small (1-33%) Fat La Necrotic Quality: Adherent Slough Tendon Expo Muscle Expo Joint Expos Bone Expose dor After Cleansing: No /Fibrino Yes Exposed Structure Exposed: No yer (Subcutaneous Tissue) Exposed: Yes sed: No sed: No ed: No d: No tion in Area: 5% tion in Volume: 52.5% ialization: Small (1-33%) ing: No ining: No Electronic Signature(s) Signed: 05/10/2019 3:07:45 PM By: [name]  RN Entered By: [name] on 01/28/2019 10:38:55 -------------------------------------------------------------------------------- Vitals Details Patient Name: Date of Service: [name], [name] 01/28/2019 10:00 AM Medical Record [id number] Patient Account Number: [id number] Date of Birth/Sex: Treating RN: 27-May-1956 (63 y.o. [name] Primary Care Adalis Gatti: [name] Other Clinician: Referring Letizia Hook: Treating Constance Whittle/Extender:Robson, Velva Harman, Essie Christine in Treatment: 320 Vital Signs Time Taken: 10:34 Temperature (F): 98.1 Height (in): 68 Pulse (bpm): 70 Weight (lbs): 127 Respiratory Rate (breaths/min): 18 Body Mass Index (BMI): 19.3 Blood Pressure (mmHg): 121/80 Reference Range: 80 - 120 mg / dl Electronic Signature(s) Signed: 05/10/2019 3:07:45 PM By: [name] RN Entered By: [name] on 01/28/2019 10:35:22

## 2019-05-10 NOTE — Progress Notes (Signed)
[name] ([id number]) Visit Report for 02/18/2019 Arrival Information Details Patient Name: Date of Service: [name], [name] 02/18/2019 10:00 AM Medical Record [id number] Patient Account Number: [id number] Date of Birth/Sex: Treating RN: [birth year] (63 y.o. [name], [name] Primary Care Justin Buechner: [name] Other Clinician: [name] Referring Laurin Morgenstern: Treating Ravindra Baranek/Extender:Robson, Tawny Asal in Treatment: 4 Visit Information History Since Last Visit Added or deleted any medications: No Patient Arrived: Ambulatory Any new allergies or adverse reactions: No Arrival Time: 09:59 Had a fall or experienced change in No Accompanied By: self activities of daily living that may affect Transfer Assistance: None risk of falls: Patient Identification Verified: Yes Signs or symptoms of abuse/neglect since last No Secondary Verification Process Completed: Yes visito Patient Requires Transmission-Based No Hospitalized since last visit: No Precautions: Implantable device outside of the clinic excluding No Patient Has Alerts: No cellular tissue based products placed in the center since last visit: Has Dressing in Place as Prescribed: Yes Pain Present Now: Yes Electronic Signature(s) Signed: 05/10/2019 3:04:24 PM By: [name] Entered By: [name] on 02/18/2019 10:04:01 -------------------------------------------------------------------------------- Compression Therapy Details Patient Name: Date of Service: [name], [name] 02/18/2019 10:00 AM Medical Record [id number] Patient Account Number: [id number] Date of Birth/Sex: Treating RN: May 11, 1956 (63 y.o. [name] Primary Care Mylo Choi: [name] Other Clinician: [name] Referring Jody Silas: Treating Cambrey Lupi/Extender:Robson, Tawny Asal in Treatment: 323 Compression Therapy Performed for Wound Wound #3 Left,Medial  Malleolus Assessment: Performed By: Clinician Deon Pilling, RN Compression Type: Rolena Infante Post Procedure Diagnosis Same as Pre-procedure Electronic Signature(s) Signed: 02/18/2019 5:53:09 PM By: Baruch Gouty RN, BSN Entered By: Baruch Gouty on 02/18/2019 10:46:05 -------------------------------------------------------------------------------- Encounter Discharge Information Details Patient Name: Date of Service: [name], [name] 02/18/2019 10:00 AM Medical Record [id number] Patient Account Number: [id number] Date of Birth/Sex: Treating RN: 1956-05-22 (63 y.o. [name] Primary Care Shawndale Kilpatrick: [name] Other Clinician: [name] Referring Nyiah Pianka: Treating Wai Litt/Extender:Robson, Tawny Asal in Treatment: 323 Encounter Discharge Information Items Discharge Condition: Stable Ambulatory Status: Cane Discharge Destination: Home Transportation: Private Auto Accompanied By: self Schedule Follow-up Appointment: Yes Clinical Summary of Care: Electronic Signature(s) Signed: 02/18/2019 5:30:49 PM By: Deon Pilling Entered By: Deon Pilling on 02/18/2019 11:09:43 -------------------------------------------------------------------------------- Lower Extremity Assessment Details Patient Name: Date of Service: [name], [name] 02/18/2019 10:00 AM Medical Record [id number] Patient Account Number: [id number] Date of Birth/Sex: Treating RN: [birth year] (63 y.o. [name] Primary Care Giabella Duhart: [name] Other Clinician: [name] Referring Yuko Coventry: Treating Jenkins Risdon/Extender:Robson, Velva Harman, Essie Christine in Treatment: 323 Edema Assessment Assessed: [Left: Yes] [Right: No] Edema: [Left: N] [Right: o] Calf Left: Right: Point of Measurement: 33 cm From Medial Instep 28 cm cm Ankle Left: Right: Point of Measurement: 12 cm From Medial Instep 22 cm cm Vascular Assessment Pulses: Dorsalis Pedis Palpable:  [Left:Yes] Electronic Signature(s) Signed: 02/18/2019 5:30:49 PM By: Deon Pilling Entered By: Deon Pilling on 02/18/2019 10:19:15 -------------------------------------------------------------------------------- Multi Wound Chart Details Patient Name: Date of Service: [name], [name] 02/18/2019 10:00 AM Medical Record [id number] Patient Account Number: [id number] Date of Birth/Sex: Treating RN: 25-Sep-1955 (63 y.o. [name] Primary Care Carrieanne Kleen: [name] Other Clinician: [name] Referring Steel Kerney: Treating Cameron Katayama/Extender:Robson, Tawny Asal in Treatment: 323 Vital Signs Height(in): 68 Pulse(bpm): 61 Weight(lbs): 127 Blood Pressure(mmHg): 115/34 Body Mass Index(BMI): 19 Temperature(F): 98.0 Respiratory 18 Rate(breaths/min): Photos: [3:No Photos] [N/A:N/A] Wound Location: [3:Left Malleolus - Medial] [N/A:N/A] Wounding Event: [3:Gradually Appeared] [N/A:N/A] Primary Etiology: [3:Venous Leg Ulcer] [N/A:N/A] Comorbid History: [3:Congestive Heart Failure, Peripheral Vascular Disease, Congestive  Heart Failure, End Stage Renal Disease, Tobacco Use, Chronic Obstructive Pulmonary Disease (COPD), Type 1 Diabetes] [N/A:N/A] Date Acquired: [3:11/15/2012] [N/A:N/A] Weeks of Treatment: [3:323] [N/A:N/A] Wound Status: [3:Open] [N/A:N/A] Measurements L x W x D 1.3x1.4x0.1 [N/A:N/A] (cm) Area (cm) : [3:1.429] [N/A:N/A] Volume (cm) : [3:0.143] [N/A:N/A] % Reduction in Area: [3:54.50%] [N/A:N/A] % Reduction in Volume: [3:77.20%] [N/A:N/A] Classification: [3:Full Thickness Without Exposed Support Structures] [N/A:N/A] Exudate Amount: [3:Medium] [N/A:N/A] Exudate Type: [3:Serosanguineous] [N/A:N/A] Exudate Color: [3:red, brown] [N/A:N/A] Wound Margin: [3:Flat and Intact] [N/A:N/A] Granulation Amount: [3:Large (67-100%)] [N/A:N/A] Granulation Quality: [3:Red] [N/A:N/A] Necrotic Amount: [3:Small (1-33%)] [N/A:N/A] Exposed Structures: [3:Fat  Layer (Subcutaneous N/A Tissue) Exposed: Yes Fascia: No Tendon: No Muscle: No Joint: No Bone: No] Epithelialization: [3:Small (1-33%)] [N/A:N/A] Procedures Performed: [3:Chemical Cauterization Compression Therapy] [N/A:N/A] Treatment Notes Wound #3 (Left, Medial Malleolus) 1. Cleanse With Wound Cleanser Soap and water 2. Periwound Care Barrier cream TCA Ointment 3. Primary Dressing Applied Iodoflex 4. Secondary Dressing Dry Gauze Kerramax/Xtrasorb 6. Support Layer Production assistant, radio Notes no kerlix per patient request. Engineer, maintenance) Signed: 02/18/2019 5:53:09 PM By: Baruch Gouty RN, BSN Signed: 02/18/2019 6:09:31 PM By: Linton Ham MD Entered By: Linton Ham on 02/18/2019 12:55:28 -------------------------------------------------------------------------------- [place] Details Patient Name: Date of Service: Shemaiah, Round 02/18/2019 10:00 AM Medical Record [id number] Patient Account Number: [id number] Date of Birth/Sex: Treating RN: 11-Aug-1955 (63 y.o. [name] Primary Care Briteny Fulghum: Other Clinician: Weston Brass, Destiny Referring Aashir Umholtz: Treating Kaven Cumbie/Extender:Robson, Tawny Asal in Treatment: 323 Active Inactive Venous Leg Ulcer Nursing Diagnoses: Actual venous Insuffiency (use after diagnosis is confirmed) Goals: Patient will maintain optimal edema control Date Initiated: 09/04/2017 Target Resolution Date: 03/18/2019 Goal Status: Active Patient/caregiver will verbalize understanding of disease process and disease management Date Initiated: 08/03/2014 Date Inactivated: 08/28/2017 Target Resolution Date: 08/29/2017 Goal Status: Met Interventions: Compression as ordered Treatment Activities: Therapeutic compression applied : 09/21/2015 Notes: Wound/Skin Impairment Nursing Diagnoses: Impaired tissue integrity Knowledge deficit related to ulceration/compromised skin  integrity Goals: Patient will have a decrease in wound volume by X% from date: (specify in notes) Date Initiated: 04/05/2015 Date Inactivated: 02/12/2017 Target Resolution Date: 09/01/2016 Unmet Reason: chronic, Goal Status: Unmet unable to tolerate stronger compression Patient/caregiver will verbalize understanding of skin care regimen Date Initiated: 04/05/2015 Target Resolution Date: 03/18/2019 Goal Status: Active Interventions: Assess patient/caregiver ability to obtain necessary supplies Assess patient/caregiver ability to perform ulcer/skin care regimen upon admission and as needed Assess ulceration(s) every visit Provide education on ulcer and skin care Notes: Electronic Signature(s) Signed: 02/18/2019 5:53:09 PM By: Baruch Gouty RN, BSN Entered By: Baruch Gouty on 02/18/2019 10:06:01 -------------------------------------------------------------------------------- Pain Assessment Details Patient Name: Date of Service: Iceis, Knab 02/18/2019 10:00 AM Medical Record [id number] Patient Account Number: [id number] Date of Birth/Sex: Treating RN: [birth year] (63 y.o. [name] Primary Care Jess Sulak: [name] Other Clinician: [name] Referring Aspen Deterding: Treating Wilna Pennie/Extender:Robson, Tawny Asal in Treatment: 323 Active Problems Location of Pain Severity and Description of Pain Patient Has Paino Yes Site Locations Rate the pain. Current Pain Level: 6 Pain Management and Medication Current Pain Management: Electronic Signature(s) Signed: 02/18/2019 5:53:09 PM By: Baruch Gouty RN, BSN Signed: 05/10/2019 3:04:24 PM By: [name] Entered By: [name] on 02/18/2019 10:04:37 -------------------------------------------------------------------------------- Patient/Caregiver Education Details Patient Name: Date of Service: Cyrena, Kuchenbecker 9/18/2020andnbsp10:00 AM Medical Record [id number] Patient Account  Number: [id number] Date of Birth/Gender: May 22, 1956 (63 y.o. F) Treating RN: Baruch Gouty Primary Care Physician: [name] Other Clinician: [name] Referring Physician: Treating Physician/Extender:Robson, Velva Harman, Barth Kirks  Weeks in Treatment: 323 Education Assessment Education Provided To: Patient Education Topics Provided Venous: Methods: Explain/Verbal Responses: Reinforcements needed, State content correctly Wound/Skin Impairment: Methods: Explain/Verbal Responses: Reinforcements needed, State content correctly Electronic Signature(s) Signed: 02/18/2019 5:53:09 PM By: Baruch Gouty RN, BSN Entered By: Baruch Gouty on 02/18/2019 10:06:40 -------------------------------------------------------------------------------- Wound Assessment Details Patient Name: Date of Service: Halley, Shepheard 02/18/2019 10:00 AM Medical Record [id number] Patient Account Number: [id number] Date of Birth/Sex: Treating RN: 11/11/1955 (63 y.o. [name] Primary Care Aniket Paye: [name] Other Clinician: [name] Referring Vonn Sliger: Treating Ygnacio Fecteau/Extender:Robson, Velva Harman, Essie Christine in Treatment: 323 Wound Status Wound Number: 3 Primary Venous Leg Ulcer Etiology: Wound Location: Left Malleolus - Medial Wound Open Wounding Event: Gradually Appeared Status: Date Acquired: 11/15/2012 Comorbid Congestive Heart Failure, Peripheral Vascular Weeks Of Treatment: 323 History: Disease, Congestive Heart Failure, End Stage Clustered Wound: No Renal Disease, Tobacco Use, Chronic Obstructive Pulmonary Disease (COPD), Type 1 Diabetes Photos Wound Measurements Length: (cm) 1.3 % Reductio Width: (cm) 1.4 % Reductio Depth: (cm) 0.1 Epithel Area: (cm) 1.429 Tunnel Volume: (cm) 0.143 Underm Wound Description Full Thickness Without Exposed Support Classification: Structures Wound Flat and Intact Margin: Exudate Medium Amount: Exudate  Serosanguineous Type: Exudate red, brown Color: Wound Bed Granulation Amount: Large (67-100%) Granulation Quality: Red Necrotic Amount: Small (1-33%) Necrotic Quality: Adherent Slough Foul Odor After Cleansing: No Slough/Fibrino Yes Exposed Structure Fascia Exposed: No Fat Layer (Subcutaneous Tissue) Exposed: Yes Tendon Exposed: No Muscle Exposed: No Joint Exposed: No Bone Exposed: No n in Area: 54.5% n in Volume: 77.2% ialization: Small (1-33%) ing: No ining: No Electronic Signature(s) Signed: 02/21/2019 9:24:03 AM By: Mikeal Hawthorne EMT/HBOT Signed: 02/22/2019 5:53:31 PM By: Baruch Gouty RN, BSN Previous Signature: 02/18/2019 5:30:49 PM Version By: Deon Pilling Previous Signature: 02/18/2019 5:53:09 PM Version By: Baruch Gouty RN, BSN Entered By: Mikeal Hawthorne on 02/21/2019 08:13:51 -------------------------------------------------------------------------------- Vitals Details Patient Name: Date of Service: Berda, Shelvin 02/18/2019 10:00 AM Medical Record [id number] Patient Account Number: [id number] Date of Birth/Sex: Treating RN: Dec 23, 1955 (63 y.o. [name] Primary Care Samier Jaco: [name] Other Clinician: [name] Referring Luverne Farone: Treating Amel Kitch/Extender:Robson, Tawny Asal in Treatment: 323 Vital Signs Time Taken: 10:04 Temperature (F): 98.0 Height (in): 68 Pulse (bpm): 61 Weight (lbs): 127 Respiratory Rate (breaths/min): 18 Body Mass Index (BMI): 19.3 Blood Pressure (mmHg): 115/34 Reference Range: 80 - 120 mg / dl Electronic Signature(s) Signed: 05/10/2019 3:04:24 PM By: [name] Entered By: [name] on 02/18/2019 10:04:19

## 2019-05-10 NOTE — Progress Notes (Signed)
[name], [name] ([id number]) Visit Report for 04/08/2019 Arrival Information Details Patient Name: Date of Service: [name], [name] 04/08/2019 10:00 AM Medical Record [id number] Patient Account Number: [id number] Date of Birth/Sex: Treating RN: 1956/01/22 (63 y.o. [name] Anzal Bartnick: [name] Other Clinician: Referring Vickey Ewbank: Treating Taiz Bickle/Extender:Robson, Tawny Asal in Treatment: 330 Visit Information History Since Last Visit Cane Added or deleted any medications: No Patient Arrived: 10:05 Any new allergies or adverse reactions: No Arrival Time: Had a fall or experienced change in No Accompanied By: self None activities of daily living that may affect Transfer Assistance: risk of falls: Patient Identification Verified: Yes Signs or symptoms of abuse/neglect since last No Secondary Verification Process Completed: Yes visito Patient Requires Transmission-Based No Hospitalized since last visit: No Precautions: Implantable device outside of the clinic excluding No Patient Has Alerts: No cellular tissue based products placed in the center since last visit: Has Dressing in Place as Prescribed: Yes Pain Present Now: Yes Electronic Signature(s) Signed: 05/10/2019 3:00:22 PM By: [name] Entered By: [name] on 04/08/2019 10:06:32 -------------------------------------------------------------------------------- Compression Therapy Details Patient Name: Date of Service: [name] 04/08/2019 10:00 AM Medical Record [id number] Patient Account Number: [id number] Date of Birth/Sex: Treating RN: 06-Jan-1956 (63 y.o. [name] Primary Care Farah Lepak: [name] Other Clinician: Referring Christianna Belmonte: Treating Yaileen Hofferber/Extender:Robson, Tawny Asal in Treatment: 330 Compression Therapy Performed for Wound Wound #3 Left,Medial Malleolus Assessment: Performed By: Clinician  Deon Pilling, RN Compression Type: Rolena Infante Post Procedure Diagnosis Same as Pre-procedure Electronic Signature(s) Signed: 04/08/2019 6:04:56 PM By: Kela Millin Entered By: Kela Millin on 04/08/2019 10:46:13 -------------------------------------------------------------------------------- Encounter Discharge Information Details Patient Name: Date of Service: [name] 04/08/2019 10:00 AM Medical Record [id number] Patient Account Number: [id number] Date of Birth/Sex: Treating RN: [birth year] (63 y.o. [name] Primary Care Cincere Deprey: [name] Other Clinician: Referring Charene Mccallister: Treating Briahna Pescador/Extender:Robson, Tawny Asal in Treatment: 330 Encounter Discharge Information Items Discharge Condition: Stable Ambulatory Status: Cane Discharge Destination: Home Transportation: Private Auto Accompanied By: self Schedule Follow-up Appointment: Yes Clinical Summary of Care: Electronic Signature(s) Signed: 04/08/2019 5:26:58 PM By: Deon Pilling Entered By: Deon Pilling on 04/08/2019 11:07:49 -------------------------------------------------------------------------------- Lower Extremity Assessment Details Patient Name: Date of Service: [name], [name] 04/08/2019 10:00 AM Medical Record [id number] Patient Account Number: [id number] Date of Birth/Sex: Treating RN: 1956-03-07 (63 y.o. [name] Primary Care Jaquelin Meaney: [name] Other Clinician: Referring Rayola Everhart: Treating Levonia Wolfley/Extender:Robson, Velva Harman, Essie Christine in Treatment: 330 Edema Assessment Assessed: [Left: Yes] [Right: No] Edema: [Left: Ye] [Right: s] Calf Left: Right: Point of Measurement: 33 cm From Medial Instep 28 cm cm Ankle Left: Right: Point of Measurement: 12 cm From Medial Instep 21 cm cm Vascular Assessment Pulses: Dorsalis Pedis Palpable: [Left:Yes] Electronic Signature(s) Signed: 04/08/2019 5:26:58 PM By: Deon Pilling Entered By: Deon Pilling on 04/08/2019 10:24:25 -------------------------------------------------------------------------------- Multi Wound Chart Details Patient Name: Date of Service: [name] 04/08/2019 10:00 AM Medical Record [id number] Patient Account Number: [id number] Date of Birth/Sex: Treating RN: January 14, 1956 (63 y.o. [name] Kalimah Capurro: [name] Other Clinician: Referring Eissa Buchberger: Treating Caige Almeda/Extender:Robson, Velva Harman, Essie Christine in Treatment: 330 Vital Signs Height(in): 68 Pulse(bpm): 70 Weight(lbs): 127 Blood Pressure(mmHg): 105/45 Body Mass Index(BMI): 19 Temperature(F): 97.9 Respiratory 17 Rate(breaths/min): Photos: [3:No Photos] [N/A:N/A] Wound Location: [3:Left Malleolus - Medial] [N/A:N/A] Wounding Event: [3:Gradually Appeared] [N/A:N/A] Primary Etiology: [3:Venous Leg Ulcer] [N/A:N/A] Comorbid History: [3:Congestive Heart Failure, Peripheral Vascular Disease, Congestive Heart Failure, End Stage Renal Disease, Tobacco  Use, Chronic Obstructive Pulmonary Disease (COPD), Type 1 Diabetes] [N/A:N/A] Date Acquired: [3:11/15/2012] [N/A:N/A] Weeks of Treatment: [3:330] [N/A:N/A] Wound Status: [3:Open] [N/A:N/A] Measurements L x W x D 0.8x1.5x0.1 [N/A:N/A] (cm) Area (cm) : [3:0.942] [N/A:N/A] Volume (cm) : [3:0.094] [N/A:N/A] % Reduction in Area: [3:70.00%] [N/A:N/A] % Reduction in Volume: [3:85.00%] [N/A:N/A] Classification: [3:Full Thickness Without Exposed Support Structures] [N/A:N/A] Exudate Amount: [3:Medium] [N/A:N/A] Exudate Type: [3:Serosanguineous] [N/A:N/A] Exudate Color: [3:red, brown] [N/A:N/A] Wound Margin: [3:Flat and Intact] [N/A:N/A] Granulation Amount: [3:Large (67-100%)] [N/A:N/A] Granulation Quality: [3:Red, Pink] [N/A:N/A] Necrotic Amount: [3:Small (1-33%)] [N/A:N/A] Exposed Structures: [3:Fat Layer (Subcutaneous N/A Tissue) Exposed: Yes Fascia: No Tendon: No Muscle: No Joint:  No Bone: No] Epithelialization: [3:Small (1-33%)] [N/A:N/A] Procedures Performed: [3:Chemical Cauterization Compression Therapy] [N/A:N/A] Treatment Notes Wound #3 (Left, Medial Malleolus) 1. Cleanse With Wound Cleanser Soap and water 2. Periwound Care Barrier cream Moisturizing lotion TCA Ointment 3. Primary Dressing Applied Hydrofera Blue 4. Secondary Dressing Dry Gauze Kerramax/Xtrasorb 6. Support Layer Art gallery manager Notes unna and coban. no kerlix Electronic Signature(s) Signed: 04/08/2019 6:12:06 PM By: Baruch Gouty RN, BSN Signed: 04/10/2019 8:54:13 AM By: Linton Ham MD Entered By: Linton Ham on 04/08/2019 12:38:22 -------------------------------------------------------------------------------- Multi-Disciplinary Care Plan Details Patient Name: Date of Service: [name], [name] 04/08/2019 10:00 AM Medical Record [id number] Patient Account Number: [id number] Date of Birth/Sex: Treating RN: 03/15/1956 (63 y.o. [name] Primary Care Raela Bohl: [name] Other Clinician: Referring Kaycee Mcgaugh: Treating Travarus Trudo/Extender:Robson, Velva Harman, Essie Christine in Treatment: 330 Active Inactive Venous Leg Ulcer Nursing Diagnoses: Actual venous Insuffiency (use after diagnosis is confirmed) Goals: Patient will maintain optimal edema control Date Initiated: 09/04/2017 Target Resolution Date: 04/15/2019 Goal Status: Active Patient/caregiver will verbalize understanding of disease process and disease management Date Initiated: 08/03/2014 Date Inactivated: 08/28/2017 Target Resolution Date: 08/29/2017 Goal Status: Met Interventions: Compression as ordered Treatment Activities: Therapeutic compression applied : 09/21/2015 Notes: Wound/Skin Impairment Nursing Diagnoses: Impaired tissue integrity Knowledge deficit related to ulceration/compromised skin integrity Goals: Patient will have a decrease in wound volume by X%  from date: (specify in notes) Date Initiated: 04/05/2015 Date Inactivated: 02/12/2017 Target Resolution Date: 09/01/2016 Unmet Reason: chronic, Goal Status: Unmet unable to tolerate stronger compression Patient/caregiver will verbalize understanding of skin care regimen Date Initiated: 04/05/2015 Target Resolution Date: 04/15/2019 Goal Status: Active Interventions: Assess patient/caregiver ability to obtain necessary supplies Assess patient/caregiver ability to perform ulcer/skin care regimen upon admission and as needed Assess ulceration(s) every visit Provide education on ulcer and skin care Notes: Electronic Signature(s) Signed: 04/08/2019 6:04:56 PM By: Kela Millin Entered By: Kela Millin on 04/08/2019 10:41:36 -------------------------------------------------------------------------------- Pain Assessment Details Patient Name: Date of Service: [name], [name] 04/08/2019 10:00 AM Medical Record [id number] Patient Account Number: [id number] Date of Birth/Sex: Treating RN: 30-Jul-1955 (64 y.o. [name] Aldrick Derrig: [name] Other Clinician: Referring Tomasina Keasling: Treating Parthena Fergeson/Extender:Robson, Tawny Asal in Treatment: 330 Active Problems Location of Pain Severity and Description of Pain Patient Has Paino Yes Site Locations Rate the pain. Current Pain Level: 5 Pain Management and Medication Current Pain Management: Electronic Signature(s) Signed: 04/08/2019 6:12:06 PM By: Baruch Gouty RN, BSN Signed: 05/10/2019 3:00:22 PM By: [name] Entered By: [name] on 04/08/2019 10:07:06 -------------------------------------------------------------------------------- Patient/Caregiver Education Details Patient Name: Date of Service: [name] 11/6/2020andnbsp10:00 AM Medical Record [id number] Patient Account Number: [id number] Date of Birth/Gender: Treating RN: 04-14-1956 (63 y.o. [name] Primary Care Physician: [name] Other Clinician: Referring Physician: Treating Physician/Extender:Robson, Tawny Asal in Treatment: [phone] Education Assessment  Education Provided To: Patient Education Topics Provided Wound/Skin Impairment: Methods: Explain/Verbal Responses: State content correctly Electronic Signature(s) Signed: 04/08/2019 6:04:56 PM By: Kela Millin Entered By: Kela Millin on 04/08/2019 10:41:59 -------------------------------------------------------------------------------- Wound Assessment Details Patient Name: Date of Service: [name], [name] 04/08/2019 10:00 AM Medical Record [id number] Patient Account Number: [id number] Date of Birth/Sex: Treating RN: [birth year] (63 y.o. [name] Camila Norville: [name] Other Clinician: Referring Ellia Knowlton: Treating Jniya Madara/Extender:Robson, Velva Harman, Essie Christine in Treatment: 330 Wound Status Wound Number: 3 Primary Venous Leg Ulcer Etiology: Wound Location: Left Malleolus - Medial Wound Open Wounding Event: Gradually Appeared Status: Date Acquired: 11/15/2012 Comorbid Congestive Heart Failure, Peripheral Vascular Weeks Of Treatment: 330 History: Disease, Congestive Heart Failure, End Stage Clustered Wound: No Renal Disease, Tobacco Use, Chronic Obstructive Pulmonary Disease (COPD), Type 1 Diabetes Photos Wound Measurements Length: (cm) 0.8 % Reduct Width: (cm) 1.5 % Reduct Depth: (cm) 0.1 Epitheli Area: (cm) 0.942 Tunneli Volume: (cm) 0.094 Underm Wound Description Full Thickness Without Exposed Support Foul O Classification: Structures Slough Wound Flat and Intact Margin: Exudate Medium Amount: Exudate Serosanguineous Type: Exudate red, brown Color: Wound Bed Granulation Amount: Large (67-100%) Granulation Quality: Red, Pink Fascia Necrotic Amount: Small (1-33%) Fat La Necrotic Quality: Adherent Slough  Tendon Muscle Joint Bone E dor After Cleansing: No /Fibrino No Exposed Structure Exposed: No yer (Subcutaneous Tissue) Exposed: Yes Exposed: No Exposed: No Exposed: No xposed: No ion in Area: 70% ion in Volume: 85% alization: Small (1-33%) ng: No ining: No Electronic Signature(s) Signed: 04/13/2019 4:27:20 PM By: Mikeal Hawthorne EMT/HBOT Signed: 04/13/2019 5:49:26 PM By: Baruch Gouty RN, BSN Previous Signature: 04/08/2019 5:26:58 PM Version By: Deon Pilling Previous Signature: 04/08/2019 6:12:06 PM Version By: Baruch Gouty RN, BSN Entered By: Mikeal Hawthorne on 04/13/2019 09:16:07 -------------------------------------------------------------------------------- Vitals Details Patient Name: Date of Service: [name] 04/08/2019 10:00 AM Medical Record [id number] Patient Account Number: [id number] Date of Birth/Sex: Treating RN: [birth year] (63 y.o. [name] Jaykob Minichiello: [name] Other Clinician: Referring Euclid Cassetta: Treating Lauryn Lizardi/Extender:Robson, Tawny Asal in Treatment: 330 Vital Signs Time Taken: 10:06 Temperature (F): 97.9 Height (in): 68 Pulse (bpm): 70 Weight (lbs): 127 Respiratory Rate (breaths/min): 17 Body Mass Index (BMI): 19.3 Blood Pressure (mmHg): 105/45 Reference Range: 80 - 120 mg / dl Electronic Signature(s) Signed: 05/10/2019 3:00:22 PM By: [name] Entered By: [name] on 04/08/2019 10:06:51

## 2019-05-10 NOTE — Progress Notes (Signed)
[name], [name] ([id number]) Visit Report for 04/22/2019 Arrival Information Details Patient Name: Date of Service: [name], [name] 04/22/2019 10:00 AM Medical Record [id number] Patient Account Number: [id number] Date of Birth/Sex: Treating RN: 03-08-1956 (63 y.o. [name] Primary Care Tamica Covell: [name] Other Clinician: Referring Tashonna Descoteaux: Treating Catheryne Deford/Extender:Robson, Tawny Asal in Treatment: 29 Visit Information History Since Last Visit Cane All ordered tests and consults were completed: No Patient Arrived: 10:02 Added or deleted any medications: No Arrival Time: Any new allergies or adverse reactions: No Accompanied By: self None Had a fall or experienced change in No Transfer Assistance: activities of daily living that may affect Patient Identification Verified: Yes risk of falls: Secondary Verification Process Completed: Yes Signs or symptoms of abuse/neglect since last No Patient Requires Transmission-Based No visito Precautions: Hospitalized since last visit: No Patient Has Alerts: No Implantable device outside of the clinic excluding No cellular tissue based products placed in the center since last visit: Has Dressing in Place as Prescribed: Yes Has Compression in Place as Prescribed: Yes Pain Present Now: No Electronic Signature(s) Signed: 05/10/2019 2:50:47 PM By: [name] RN Entered By: [name] on 04/22/2019 10:03:52 -------------------------------------------------------------------------------- Compression Therapy Details Patient Name: Date of Service: [name] 04/22/2019 10:00 AM Medical Record [id number] Patient Account Number: [id number] Date of Birth/Sex: Treating RN: Jul 20, 1955 (63 y.o. [name] Primary Care Henri Guedes: [name] Other Clinician: Referring Szymon Foiles: Treating Kathryn Linarez/Extender:Robson, Tawny Asal in Treatment: 332 Compression  Therapy Performed for Wound Wound #3 Left,Medial Malleolus Assessment: Performed By: Clinician Deon Pilling, RN Compression Type: Rolena Infante Post Procedure Diagnosis Same as Pre-procedure Electronic Signature(s) Signed: 04/25/2019 2:17:56 PM By: Kela Millin Entered By: Kela Millin on 04/22/2019 10:59:47 -------------------------------------------------------------------------------- Encounter Discharge Information Details Patient Name: Date of Service: [name] 04/22/2019 10:00 AM Medical Record [id number] Patient Account Number: [id number] Date of Birth/Sex: Treating RN: 01-12-1956 (62 y.o. [name] Primary Care Belkis Norbeck: [name] Other Clinician: Referring Reiana Poteet: Treating Kendrix Orman/Extender:Robson, Tawny Asal in Treatment: [phone] Encounter Discharge Information Items Post Procedure Vitals Discharge Condition: Stable Temperature (F): 98 Ambulatory Status: Cane Pulse (bpm): 64 Discharge Destination: Home Respiratory Rate (breaths/min): 18 Transportation: Private Auto Blood Pressure (mmHg): 117/34 Accompanied By: self Schedule Follow-up Appointment: Yes Clinical Summary of Care: Electronic Signature(s) Signed: 04/22/2019 6:01:02 PM By: Deon Pilling Entered By: Deon Pilling on 04/22/2019 11:18:01 -------------------------------------------------------------------------------- Lower Extremity Assessment Details Patient Name: Date of Service: [name], [name] 04/22/2019 10:00 AM Medical Record [id number] Patient Account Number: [id number] Date of Birth/Sex: Treating RN: 07-02-1955 (63 y.o. [name] Primary Care Danilyn Cocke: [name] Other Clinician: Referring Mccall Lomax: Treating Sheyenne Konz/Extender:Robson, Velva Harman, Essie Christine in Treatment: 332 Edema Assessment Assessed: [Left: No] [Right: No] Edema: [Left: Ye] [Right: s] Calf Left: Right: Point of Measurement: 33 cm From Medial Instep 33 cm  cm Ankle Left: Right: Point of Measurement: 12 cm From Medial Instep 21 cm cm Electronic Signature(s) Signed: 05/10/2019 2:50:47 PM By: [name] RN Entered By: [name] on 04/22/2019 10:16:08 -------------------------------------------------------------------------------- Multi Wound Chart Details Patient Name: Date of Service: [name] 04/22/2019 10:00 AM Medical Record [id number] Patient Account Number: [id number] Date of Birth/Sex: Treating RN: 18-Oct-1955 (63 y.o. [name] Primary Care Ellean Firman: [name] Other Clinician: Referring Ahtziry Saathoff: Treating Kenshin Splawn/Extender:Robson, Tawny Asal in Treatment: 332 Vital Signs Height(in): 68 Pulse(bpm): 64 Weight(lbs): 127 Blood Pressure(mmHg): 117/34 Body Mass Index(BMI): 19 Temperature(F): 98 Respiratory 18 Rate(breaths/min): Photos: [3:No Photos] [N/A:N/A] Wound Location: [3:Left Malleolus - Medial] [N/A:N/A]  Wounding Event: [3:Gradually Appeared] [N/A:N/A] Primary Etiology: [3:Venous Leg Ulcer] [N/A:N/A] Comorbid History: [3:Congestive Heart Failure, Peripheral Vascular Disease, Congestive Heart Failure, End Stage Renal Disease, Tobacco Use, Chronic Obstructive Pulmonary Disease (COPD), Type 1 Diabetes] [N/A:N/A] Date Acquired: [3:11/15/2012] [N/A:N/A] Weeks of Treatment: [3:332] [N/A:N/A] Wound Status: [3:Open] [N/A:N/A] Measurements L x W x D 1.4x1.3x0.1 [N/A:N/A] (cm) Area (cm) : [3:1.429] [N/A:N/A] Volume (cm) : [3:0.143] [N/A:N/A] % Reduction in Area: [3:54.50%] [N/A:N/A] % Reduction in Volume: 77.20% [N/A:N/A] Classification: [3:Full Thickness Without Exposed Support Structures] [N/A:N/A] Exudate Amount: [3:Medium] [N/A:N/A] Exudate Type: [3:Serosanguineous] [N/A:N/A] Exudate Color: [3:red, brown] [N/A:N/A] Wound Margin: [3:Flat and Intact] [N/A:N/A] Granulation Amount: [3:Large (67-100%)] [N/A:N/A] Granulation Quality: [3:Red, Pink] [N/A:N/A] Necrotic  Amount: [3:Small (1-33%)] [N/A:N/A] Exposed Structures: [3:Fat Layer (Subcutaneous N/A Tissue) Exposed: Yes Fascia: No Tendon: No Muscle: No Joint: No Bone: No] Epithelialization: [3:Small (1-33%)] [N/A:N/A] Debridement: [3:Debridement - Excisional N/A] Pre-procedure [3:10:58] [N/A:N/A] Verification/Time Out Taken: Pain Control: [3:Other] [N/A:N/A] Tissue Debrided: [3:Subcutaneous, Slough] [N/A:N/A] Level: [3:Skin/Subcutaneous Tissue] [N/A:N/A] Debridement Area (sq cm):1.96 [N/A:N/A] Instrument: [3:Curette] [N/A:N/A] Bleeding: [3:Minimum] [N/A:N/A] Hemostasis Achieved: [3:Pressure] [N/A:N/A] Procedural Pain: [3:2] [N/A:N/A] Post Procedural Pain: [3:0] [N/A:N/A] Debridement Treatment Procedure was tolerated [N/A:N/A] Response: [3:well] Post Debridement [3:1.4x1.3x0.1] [N/A:N/A] Measurements L x W x D (cm) Post Debridement [3:0.143] [N/A:N/A] Volume: (cm) Procedures Performed: Compression Therapy [3:Debridement] [N/A:N/A] Treatment Notes Wound #3 (Left, Medial Malleolus) 1. Cleanse With Wound Cleanser Soap and water 2. Periwound Care TCA Ointment 3. Primary Dressing Applied Other primary dressing (specifiy in notes) 4. Secondary Dressing Dry Gauze Kerramax/Xtrasorb 6. Support Layer Teacher, English as a foreign language Boot Notes [place]. no kerlix. primary dressing sorbact. foam to anterior lower leg for protection. Electronic Signature(s) Signed: 04/22/2019 6:05:38 PM By: Linton Ham MD Signed: 04/25/2019 2:17:56 PM By: Kela Millin Entered By: Linton Ham on 04/22/2019 12:25:19 -------------------------------------------------------------------------------- [place] Details Patient Name: Date of Service: [name], [name] 04/22/2019 10:00 AM Medical Record [id number] Patient Account Number: [id number] Date of Birth/Sex: Treating RN: May 23, 1956 (63 y.o. [name] Primary Care Katanya Schlie: [name] Other Clinician: Referring  Lansing Sigmon: Treating Terran Hollenkamp/Extender:Robson, Tawny Asal in Treatment: [phone] Active Inactive Venous Leg Ulcer Nursing Diagnoses: Actual venous Insuffiency (use after diagnosis is confirmed) Goals: Patient will maintain optimal edema control Date Initiated: 09/04/2017 Target Resolution Date: 05/13/2019 Goal Status: Active Patient/caregiver will verbalize understanding of disease process and disease management Date Initiated: 08/03/2014 Date Inactivated: 08/28/2017 Target Resolution Date: 08/29/2017 Goal Status: Met Interventions: Compression as ordered Treatment Activities: Therapeutic compression applied : 09/21/2015 Notes: Wound/Skin Impairment Nursing Diagnoses: Impaired tissue integrity Knowledge deficit related to ulceration/compromised skin integrity Goals: Patient will have a decrease in wound volume by X% from date: (specify in notes) Date Initiated: 04/05/2015 Date Inactivated: 02/12/2017 Target Resolution Date: 09/01/2016 Unmet Reason: chronic, Goal Status: Unmet unable to tolerate stronger compression Patient/caregiver will verbalize understanding of skin care regimen Date Initiated: 04/05/2015 Target Resolution Date: 05/13/2019 Goal Status: Active Interventions: Assess patient/caregiver ability to obtain necessary supplies Assess patient/caregiver ability to perform ulcer/skin care regimen upon admission and as needed Assess ulceration(s) every visit Provide education on ulcer and skin care Notes: Electronic Signature(s) Signed: 04/25/2019 2:17:56 PM By: Kela Millin Entered By: Kela Millin on 04/22/2019 11:04:02 -------------------------------------------------------------------------------- Pain Assessment Details Patient Name: Date of Service: [name], [name] 04/22/2019 10:00 AM Medical Record [id number] Patient Account Number: [id number] Date of Birth/Sex: Treating RN: 06-25-1955 (63 y.o. [name] Primary Care Cicero Noy:  [name] Other Clinician: Referring Kelvin Burpee: Treating Heiley Shaikh/Extender:Robson, Tawny Asal in Treatment: [phone] Active Problems  Location of Pain Severity and Description of Pain Patient Has Paino No Site Locations Pain Management and Medication Current Pain Management: Electronic Signature(s) Signed: 05/10/2019 2:50:47 PM By: [name] RN Entered By: [name] on 04/22/2019 10:04:33 -------------------------------------------------------------------------------- Patient/Caregiver Education Details Patient Name: Date of Service: [name] 11/20/2020andnbsp10:00 AM Medical Record [id number] Patient Account Number: [id number] Date of Birth/Gender: Treating RN: 11-16-1955 (63 y.o. [name] Primary Care Physician: [name] Other Clinician: Referring Physician: Treating Physician/Extender:Robson, Tawny Asal in Treatment: [phone] Education Assessment Education Provided To: Patient Education Topics Provided Wound/Skin Impairment: Methods: Explain/Verbal Responses: State content correctly Electronic Signature(s) Signed: 04/25/2019 2:17:56 PM By: Kela Millin Entered By: Kela Millin on 04/22/2019 11:04:25 -------------------------------------------------------------------------------- Wound Assessment Details Patient Name: Date of Service: [name], [name] 04/22/2019 10:00 AM Medical Record [id number] Patient Account Number: [id number] Date of Birth/Sex: Treating RN: 10/14/1955 (63 y.o. [name] Primary Care Denvil Canning: [name] Other Clinician: Referring Raef Sprigg: Treating Loyd Marhefka/Extender:Robson, Velva Harman, Essie Christine in Treatment: 332 Wound Status Wound Number: 3 Primary Venous Leg Ulcer Etiology: Wound Location: Left Malleolus - Medial Wound Open Wounding Event: Gradually Appeared Status: Date Acquired: 11/15/2012 Comorbid Congestive Heart Failure, Peripheral  Vascular Weeks Of Treatment: 332 History: Disease, Congestive Heart Failure, End Stage Clustered Wound: No Renal Disease, Tobacco Use, Chronic Obstructive Pulmonary Disease (COPD), Type 1 Diabetes Photos Wound Measurements Length: (cm) 1.4 % Red Width: (cm) 1.3 % Red Depth: (cm) 0.1 Epith Area: (cm) 1.429 Tunn Volume: (cm) 0.143 Unde Wound Description Full Thickness Without Exposed Support Classification: Structures Wound Flat and Intact Margin: Exudate Medium Amount: Exudate Serosanguineous Type: Exudate red, brown Color: Wound Bed Granulation Amount: Large (67-100%) Granulation Quality: Red, Pink Necrotic Amount: Small (1-33%) Necrotic Quality: Adherent Slough Foul Odor After Cleansing: No Slough/Fibrino No Exposed Structure Fascia Exposed: No Fat Layer (Subcutaneous Tissue) Exposed: Yes Tendon Exposed: No Muscle Exposed: No Joint Exposed: No Bone Exposed: No uction in Area: 54.5% uction in Volume: 77.2% elialization: Small (1-33%) eling: No rmining: No Treatment Notes Wound #3 (Left, Medial Malleolus) 1. Cleanse With Wound Cleanser Soap and water 2. Periwound Care TCA Ointment 3. Primary Dressing Applied Other primary dressing (specifiy in notes) 4. Secondary Dressing Dry Gauze Kerramax/Xtrasorb 6. Support Layer Teacher, English as a foreign language Boot Notes [place]. no kerlix. primary dressing sorbact. foam to anterior lower leg for protection. Electronic Signature(s) Signed: 04/27/2019 2:51:44 PM By: Mikeal Hawthorne EMT/HBOT Signed: 05/10/2019 2:50:47 PM By: [name] RN Entered By: Mikeal Hawthorne on 04/27/2019 10:03:04 -------------------------------------------------------------------------------- Vitals Details Patient Name: Date of Service: [name] 04/22/2019 10:00 AM Medical Record [id number] Patient Account Number: [id number] Date of Birth/Sex: Treating RN: 10-26-1955 (63 y.o. [name] Primary Care Adolfo Granieri: [name] Other Clinician: Referring Haniya Fern: Treating Darien Kading/Extender:Robson, Tawny Asal in Treatment: 332 Vital Signs Time Taken: 10:03 Temperature (F): 98 Height (in): 68 Pulse (bpm): 64 Weight (lbs): 127 Respiratory Rate (breaths/min): 18 Body Mass Index (BMI): 19.3 Blood Pressure (mmHg): 117/34 Reference Range: 80 - 120 mg / dl Electronic Signature(s) Signed: 05/10/2019 2:50:47 PM By: [name] RN Entered By: [name] on 04/22/2019 10:04:23

## 2019-05-10 NOTE — Progress Notes (Signed)
[name], [name] ([id number]) Visit Report for 04/15/2019 Arrival Information Details Patient Name: Date of Service: [name], [name] 04/15/2019 10:00 AM Medical Record [id number] Patient Account Number: [id number] Date of Birth/Sex: Treating RN: 01/24/1956 (63 y.o. F) Dwiggins, Larene Beach Primary Care Tarin Navarez: [name] Other Clinician: Referring Kona Yusuf: Treating Azelyn Batie/Extender:Robson, Tawny Asal in Treatment: 331 Visit Information History Since Last Visit Cane Added or deleted any medications: No Patient Arrived: 10:10 Any new allergies or adverse reactions: No Arrival Time: Had a fall or experienced change in No Accompanied By: self None activities of daily living that may affect Transfer Assistance: risk of falls: Patient Identification Verified: Yes Signs or symptoms of abuse/neglect since last No Secondary Verification Process Completed: Yes visito Patient Requires Transmission-Based No Hospitalized since last visit: No Precautions: Implantable device outside of the clinic excluding No Patient Has Alerts: No cellular tissue based products placed in the center since last visit: Has Dressing in Place as Prescribed: Yes Pain Present Now: Yes Electronic Signature(s) Signed: 05/10/2019 3:00:22 PM By: [name] Entered By: [name] on 04/15/2019 10:15:17 -------------------------------------------------------------------------------- Compression Therapy Details Patient Name: Date of Service: [name], [name] 04/15/2019 10:00 AM Medical Record [id number] Patient Account Number: [id number] Date of Birth/Sex: Treating RN: 21-May-1956 (63 y.o. [name] Primary Care Izaiyah Kleinman: [name] Other Clinician: Referring Alize Borrayo: Treating Harsh Trulock/Extender:Robson, Tawny Asal in Treatment: 331 Compression Therapy Performed for Wound Wound #3 Left,Medial Malleolus Assessment: Performed By:  Clinician Kela Millin, RN Compression Type: Rolena Infante Post Procedure Diagnosis Same as Pre-procedure Electronic Signature(s) Signed: 04/15/2019 5:20:59 PM By: Kela Millin Entered By: Kela Millin on 04/15/2019 14:01:04 -------------------------------------------------------------------------------- Encounter Discharge Information Details Patient Name: Date of Service: [name] 04/15/2019 10:00 AM Medical Record [id number] Patient Account Number: [id number] Date of Birth/Sex: Treating RN: Jan 31, 1956 (63 y.o. [name] Primary Care Makaylia Hewett: [name] Other Clinician: Referring Hiliana Eilts: Treating Eevie Lapp/Extender:Robson, Tawny Asal in Treatment: [phone] Encounter Discharge Information Items Post Procedure Vitals Discharge Condition: Stable Temperature (F): 97.7 Ambulatory Status: Cane Pulse (bpm): 56 Discharge Destination: Home Respiratory Rate (breaths/min): 17 Transportation: Private Auto Blood Pressure (mmHg): 124/46 Accompanied By: self Schedule Follow-up Appointment: Yes Clinical Summary of Care: Electronic Signature(s) Signed: 04/15/2019 5:02:36 PM By: Deon Pilling Entered By: Deon Pilling on 04/15/2019 11:28:13 -------------------------------------------------------------------------------- Lower Extremity Assessment Details Patient Name: Date of Service: [name], [name] 04/15/2019 10:00 AM Medical Record [id number] Patient Account Number: [id number] Date of Birth/Sex: Treating RN: 1955-09-20 (63 y.o. [name] Primary Care Deandre Stansel: [name] Other Clinician: Referring Patricia Perales: Treating Bayan Hedstrom/Extender:Robson, Velva Harman, Essie Christine in Treatment: 331 Edema Assessment Assessed: [Left: No] [Right: No] Edema: [Left: Ye] [Right: s] Calf Left: Right: Point of Measurement: 33 cm From Medial Instep 32 cm cm Ankle Left: Right: Point of Measurement: 12 cm From Medial Instep 20 cm  cm Electronic Signature(s) Signed: 05/10/2019 2:49:42 PM By: [name] RN Entered By: [name] on 04/15/2019 10:35:11 -------------------------------------------------------------------------------- Multi Wound Chart Details Patient Name: Date of Service: [name] 04/15/2019 10:00 AM Medical Record [id number] Patient Account Number: [id number] Date of Birth/Sex: Treating RN: [birth year] (63 y.o. [name] Primary Care Simrat Kendrick: [name] Other Clinician: Referring Idamay Hosein: Treating Blessed Girdner/Extender:Robson, Tawny Asal in Treatment: 331 Vital Signs Height(in): 68 Pulse(bpm): 56 Weight(lbs): 127 Blood Pressure(mmHg): 124/46 Body Mass Index(BMI): 19 Temperature(F): 97.7 Respiratory 17 Rate(breaths/min): Photos: [3:No Photos] [N/A:N/A] Wound Location: [3:Left Malleolus - Medial] [N/A:N/A] Wounding Event: [3:Gradually Appeared] [N/A:N/A] Primary Etiology: [3:Venous Leg Ulcer] [N/A:N/A] Comorbid History: [3:Congestive Heart Failure,  N/A Peripheral Vascular Disease, Congestive Heart Failure, End Stage Renal Disease, Tobacco Use, Chronic Obstructive Pulmonary Disease (COPD), Type 1 Diabetes] Date Acquired: [3:11/15/2012] [N/A:N/A] Weeks of Treatment: [3:331] [N/A:N/A] Wound Status: [3:Open] [N/A:N/A] Measurements L x W x D 1x1.2x0.1 [N/A:N/A] (cm) Area (cm) : [3:0.942] [N/A:N/A] Volume (cm) : [3:0.094] [N/A:N/A] % Reduction in Area: [3:70.00%] [N/A:N/A] % Reduction in Volume: 85.00% [N/A:N/A] Classification: [3:Full Thickness Without Exposed Support Structures] [N/A:N/A] Exudate Amount: [3:Medium] [N/A:N/A] Exudate Type: [3:Serosanguineous] [N/A:N/A] Exudate Color: [3:red, brown] [N/A:N/A] Wound Margin: [3:Flat and Intact] [N/A:N/A] Granulation Amount: [3:Large (67-100%)] [N/A:N/A] Granulation Quality: [3:Red, Pink] [N/A:N/A] Necrotic Amount: [3:Small (1-33%)] [N/A:N/A] Exposed Structures: [3:Fat Layer (Subcutaneous  Tissue) Exposed: Yes Fascia: No Tendon: No Muscle: No Joint: No Bone: No] [N/A:N/A] Epithelialization: [3:Small (1-33%)] [N/A:N/A] Debridement: [3:Debridement - Excisional] [N/A:N/A] Pre-procedure [3:10:49] [N/A:N/A] Verification/Time Out Taken: Pain Control: [3:Other] [N/A:N/A] Tissue Debrided: [3:Subcutaneous, Slough] [N/A:N/A] Level: [3:Skin/Subcutaneous Tissue] [N/A:N/A] Debridement Area (sq cm):1.2 [N/A:N/A] Instrument: [3:Curette] [N/A:N/A] Bleeding: [3:Minimum] [N/A:N/A] Hemostasis Achieved: [3:Pressure] [N/A:N/A] Debridement Treatment Procedure was tolerated [N/A:N/A] Response: [3:well] Post Debridement [3:1x1.2x0.1] [N/A:N/A] Measurements L x W x D (cm) Post Debridement [3:0.094] [N/A:N/A] Volume: (cm) Procedures Performed: Debridement [N/A:N/A] Treatment Notes Electronic Signature(s) Signed: 04/15/2019 5:20:59 PM By: Kela Millin Signed: 04/15/2019 5:33:43 PM By: Linton Ham MD Entered By: Linton Ham on 04/15/2019 11:06:30 -------------------------------------------------------------------------------- Multi-Disciplinary Care Plan Details Patient Name: Date of Service: [name]. 04/15/2019 10:00 AM Medical Record [id number] Patient Account Number: [id number] Date of Birth/Sex: Treating RN: [birth year] (63 y.o. [name] Primary Care Shylyn Younce: [name] Other Clinician: Referring Josef Tourigny: Treating Takayla Baillie/Extender:Robson, Tawny Asal in Treatment: 331 Active Inactive Venous Leg Ulcer Nursing Diagnoses: Actual venous Insuffiency (use after diagnosis is confirmed) Goals: Patient will maintain optimal edema control Date Initiated: 09/04/2017 Target Resolution Date: 05/13/2019 Goal Status: Active Patient/caregiver will verbalize understanding of disease process and disease management Date Initiated: 08/03/2014 Date Inactivated: 08/28/2017 Target Resolution Date: 08/29/2017 Goal Status:  Met Interventions: Compression as ordered Treatment Activities: Therapeutic compression applied : 09/21/2015 Notes: Wound/Skin Impairment Nursing Diagnoses: Impaired tissue integrity Knowledge deficit related to ulceration/compromised skin integrity Goals: Patient will have a decrease in wound volume by X% from date: (specify in notes) Date Initiated: 04/05/2015 Date Inactivated: 02/12/2017 Target Resolution Date: 09/01/2016 Unmet Reason: chronic, Goal Status: Unmet unable to tolerate stronger compression Patient/caregiver will verbalize understanding of skin care regimen Date Initiated: 04/05/2015 Target Resolution Date: 05/13/2019 Goal Status: Active Interventions: Assess patient/caregiver ability to obtain necessary supplies Assess patient/caregiver ability to perform ulcer/skin care regimen upon admission and as needed Assess ulceration(s) every visit Provide education on ulcer and skin care Notes: Electronic Signature(s) Signed: 04/15/2019 5:20:59 PM By: Kela Millin Entered By: Kela Millin on 04/15/2019 10:48:37 -------------------------------------------------------------------------------- Pain Assessment Details Patient Name: Date of Service: [name], [name] 04/15/2019 10:00 AM Medical Record [id number] Patient Account Number: [id number] Date of Birth/Sex: Treating RN: 1955-11-10 (63 y.o. [name] Primary Care Starlene Consuegra: [name] Other Clinician: Referring Crystalmarie Yasin: Treating Shaquira Moroz/Extender:Robson, Tawny Asal in Treatment: 331 Active Problems Location of Pain Severity and Description of Pain Patient Has Paino Yes Site Locations Rate the pain. Current Pain Level: 5 Pain Management and Medication Current Pain Management: Electronic Signature(s) Signed: 04/15/2019 5:20:59 PM By: Kela Millin Signed: 05/10/2019 3:00:22 PM By: [name] Entered By: [name] on 04/15/2019  10:15:44 -------------------------------------------------------------------------------- Patient/Caregiver Education Details Patient Name: Date of Service: [name] 11/13/2020andnbsp10:00 AM Medical Record [id number] Patient Account Number: [id number] Date of Birth/Gender: Treating RN: 1956/01/07 (63 y.o. F) Dwiggins, Larene Beach  Primary Care Physician: [name] Other Clinician: Referring Physician: Treating Physician/Extender:Robson, Tawny Asal in Treatment: 331 Education Assessment Education Provided To: Patient Education Topics Provided Wound/Skin Impairment: Methods: Explain/Verbal Responses: State content correctly Electronic Signature(s) Signed: 04/15/2019 5:20:59 PM By: Kela Millin Entered By: Kela Millin on 04/15/2019 10:53:23 -------------------------------------------------------------------------------- Wound Assessment Details Patient Name: Date of Service: [name], [name] 04/15/2019 10:00 AM Medical Record [id number] Patient Account Number: [id number] Date of Birth/Sex: Treating RN: 10-May-1956 (62 y.o. [name] Primary Care Miangel Flom: [name] Other Clinician: Referring Tanaisha Pittman: Treating Tiegan Terpstra/Extender:Robson, Velva Harman, Essie Christine in Treatment: 331 Wound Status Wound Number: 3 Primary Venous Leg Ulcer Etiology: Wound Location: Left Malleolus - Medial Wound Open Wounding Event: Gradually Appeared Status: Date Acquired: 11/15/2012 Comorbid Congestive Heart Failure, Peripheral Vascular Weeks Of Treatment: 331 History: Disease, Congestive Heart Failure, End Stage Clustered Wound: No Renal Disease, Tobacco Use, Chronic Obstructive Pulmonary Disease (COPD), Type 1 Diabetes Photos Wound Measurements Length: (cm) 1 % Redu Width: (cm) 1.2 % Redu Depth: (cm) 0.1 Epithe Area: (cm) 0.942 Tunne Volume: (cm) 0.094 Under Wound Description Classification: Full Thickness Without  Exposed Support Foul O Structures Slough Wound Flat and Intact Margin: Exudate Medium Amount: Exudate Serosanguineous Type: Exudate red, brown Color: Wound Bed Granulation Amount: Large (67-100%) Granulation Quality: Red, Pink Fascia Exp Necrotic Amount: Small (1-33%) Fat Layer Necrotic Quality: Adherent Slough Tendon Exp Muscle Exp Joint Expo Bone Expos dor After Cleansing: No /Fibrino No Exposed Structure osed: No (Subcutaneous Tissue) Exposed: Yes osed: No osed: No sed: No ed: No ction in Area: 70% ction in Volume: 85% lialization: Small (1-33%) ling: No mining: No Electronic Signature(s) Signed: 04/19/2019 4:00:22 PM By: Mikeal Hawthorne EMT/HBOT Signed: 04/21/2019 6:05:31 PM By: Kela Millin Previous Signature: 04/15/2019 5:20:59 PM Version By: Kela Millin Entered By: Mikeal Hawthorne on 04/18/2019 08:47:05 -------------------------------------------------------------------------------- Vitals Details Patient Name: Date of Service: [name] 04/15/2019 10:00 AM Medical Record [id number] Patient Account Number: [id number] Date of Birth/Sex: Treating RN: 1955-12-25 (63 y.o. [name] Primary Care Celester Lech: [name] Other Clinician: Referring Ryott Rafferty: Treating Nicoletta Hush/Extender:Robson, Tawny Asal in Treatment: 331 Vital Signs Time Taken: 10:15 Temperature (F): 97.7 Height (in): 68 Pulse (bpm): 56 Weight (lbs): 127 Respiratory Rate (breaths/min): 17 Body Mass Index (BMI): 19.3 Blood Pressure (mmHg): 124/46 Reference Range: 80 - 120 mg / dl Electronic Signature(s) Signed: 05/10/2019 3:00:22 PM By: [name] Entered By: [name] on 04/15/2019 10:15:33

## 2019-05-11 ENCOUNTER — Other Ambulatory Visit: Payer: Self-pay

## 2019-05-11 ENCOUNTER — Encounter: Payer: Medicare Other | Attending: Physical Medicine & Rehabilitation | Admitting: Physical Medicine and Rehabilitation

## 2019-05-11 ENCOUNTER — Encounter: Payer: Self-pay | Admitting: Physical Medicine and Rehabilitation

## 2019-05-11 VITALS — BP 100/62 | HR 66 | Temp 97.5°F | Ht 68.0 in | Wt 142.0 lb

## 2019-05-11 DIAGNOSIS — M21542 Acquired clubfoot, left foot: Secondary | ICD-10-CM | POA: Diagnosis not present

## 2019-05-11 DIAGNOSIS — R292 Abnormal reflex: Secondary | ICD-10-CM | POA: Diagnosis not present

## 2019-05-11 DIAGNOSIS — G249 Dystonia, unspecified: Secondary | ICD-10-CM

## 2019-05-11 DIAGNOSIS — R269 Unspecified abnormalities of gait and mobility: Secondary | ICD-10-CM | POA: Diagnosis not present

## 2019-05-11 DIAGNOSIS — L97919 Non-pressure chronic ulcer of unspecified part of right lower leg with unspecified severity: Secondary | ICD-10-CM

## 2019-05-11 DIAGNOSIS — I83019 Varicose veins of right lower extremity with ulcer of unspecified site: Secondary | ICD-10-CM

## 2019-05-11 DIAGNOSIS — I83029 Varicose veins of left lower extremity with ulcer of unspecified site: Secondary | ICD-10-CM

## 2019-05-11 DIAGNOSIS — L97929 Non-pressure chronic ulcer of unspecified part of left lower leg with unspecified severity: Secondary | ICD-10-CM

## 2019-05-11 NOTE — Patient Instructions (Signed)
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 Suggest Dr. Neldon Mc or Dr Gershon Mussel- both Pediatric Orthopedics- [phone]. For Equinovarus  of L foot- needs evaluation for possible surgical intervention- pt has tried Botox at max dose with no significant improvement in dorsiflexion. Only issue is pt's wound healing issues- has a small 1.3 x 1.4 x 0.1 cm superficial wound on L medial malleolus.   2. Continue Botox until surgery possible.  3. Con't wound care as she does.  4. F/U in 2-3 weeks after next Botox injections.

## 2019-05-11 NOTE — Progress Notes (Signed)
Subjective:    Patient ID: [name], female    DOB: [birth year], 63 y.o.   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 Here for f/u after Botox  Here for f/u for Botox by Dr. Posey Pronto Did a little stronger dose this time (200 units first time; 400units this time)  MUSCLE UNITS Left Med Gastroc:       100 Left Lat Gastroc:         100 Left Soleus:                 100 Left FHL:                     100  Total units used: 400   Wondering if eating salts- snacking a little- wondering if salt is adding to stiffness/swelling.  Think it's a little better When walks, still rolls a little And was on it more last week than normally By afternoon, was locking up again. Helping some!  L foot wound still slightly open- needed debridement last the Friday before Thanksgiving. Last wound measurements are 1.3 x 1.4 cm x 0.1 cm depth- on 11/25 Has wound healing issues.  Pain Inventory Average Pain 6 Pain Right Now 5 My pain is constant, sharp and aching  In the last 24 hours, has pain interfered with the following? General activity 5 Relation with others 5 Enjoyment of life 8 What TIME of day is your pain at its worst? morning Sleep (in general) Fair  Pain is worse with: walking and standing Pain improves with: rest Relief from Meds: 5  Mobility use a cane ability to climb steps?  no do you drive?  yes  Function retired  Neuro/Psych trouble walking  Prior Studies Any changes since last visit?  no  Physicians involved in your care Any changes since last visit?  no   Family History  Problem Relation Age of Onset  . Hypertension Mother   . Lung cancer Father   . Diabetes Maternal Grandmother   . Diabetes Paternal  Grandmother   . Healthy Brother    Social History   Socioeconomic History  . Marital status: Married    Spouse name: Not on file  . Number of children: 0  . Years of education: 72  . Highest education level: Not on file  Occupational History  . Occupation: retired Glass blower/designer  Social Needs  . Financial resource strain: Not on file  . Food insecurity    Worry: Not on file    Inability: Not on file  . Transportation needs    Medical: Not on file    Non-medical: Not on file  Tobacco Use  . Smoking status: Former Smoker    Packs/day: 0.25    Years: 26.00    Pack years: 6.50    Types: Cigarettes    Quit date: 04/25/2007    Years since quitting: 12.0  . Smokeless tobacco: Never Used  Substance and Sexual Activity  . Alcohol use: Yes    Alcohol/week: 0.0 standard drinks    Comment: occasionally  . Drug use: No  . Sexual activity: Yes  Lifestyle  . Physical activity    Days per week:  Not on file    Minutes per session: Not on file  . Stress: Not on file  Relationships  . Social Herbalist on phone: Not on file    Gets together: Not on file    Attends religious service: Not on file    Active member of club or organization: Not on file    Attends meetings of clubs or organizations: Not on file    Relationship status: Not on file  Other Topics Concern  . Not on file  Social History Narrative   Patient lives with husband in a one story home. Has no children.  Took early retirement due to medical condition.  Formerly worked as a Glass blower/designer.  Education: 12th.     Past Surgical History:  Procedure Laterality Date  . APPLICATION OF WOUND VAC Bilateral 12/12/2015   Procedure: APPLICATION OF WOUND VAC;  Surgeon: [name], DO;  Location: [facility];  Service: Plastics;  Laterality: Bilateral;  . DRESSING CHANGE UNDER ANESTHESIA Right 02/14/2016   Procedure: DRESSING CHANGE UNDER ANESTHESIA;  Surgeon: [name], DO;   Location: [facility];  Service: Plastics;  Laterality: Right;  . [place]  . I&D EXTREMITY Bilateral 12/12/2015   Procedure: DEBRIDEMENT OF BILATERAL LEG WOUNDS;  Surgeon: [name], DO;  Location: [facility];  Service: Plastics;  Laterality: Bilateral;  . I&D EXTREMITY Bilateral 01/07/2016   Procedure: IRRIGATION AND DEBRIDEMENT OF BILATERAL LEGS;  Surgeon: [name], DO;  Location: [facility];  Service: Plastics;  Laterality: Bilateral;  . PERIPHERAL VASCULAR CATHETERIZATION N/A 10/15/2015   Procedure: Abdominal Aortogram w/Lower Extremity;  Surgeon: Angelia Mould, MD;  Location: [facility];  Service: Cardiovascular;  Laterality: N/A;  . RADIOLOGY WITH ANESTHESIA N/A 02/24/2019   Procedure: MIR CERVICAL WITH AND WITHOUT CONTRAST;  Surgeon: Radiologist, Medication, MD;  Location: [facility];  Service: Radiology;  Laterality: N/A;  . skin graph    . TONSILLECTOMY     as a child  . VEIN LIGATION AND STRIPPING Left late 1990's  . WOUND DEBRIDEMENT Left 02/14/2016   Procedure: SKIN GRAFT SPLIT THICKNESS TO LEFT ANKLE;  Surgeon: [name], DO;  Location: [facility];  Service: Plastics;  Laterality: Left;   Past Medical History:  Diagnosis Date  . Allergy   . Ankle wound 02/2016   bilateral  . Dental bridge present    lower  . Dental crowns present    upper right  . Open wounds involving multiple regions of lower extremity   . Peripheral vascular disease (HCC)    venous stasis ulcers bilateral lower extremities  . PONV (postoperative nausea and vomiting)    Temp (!) 97.5 F (36.4 C)   Ht 5\' 8"  (1.727 m)   Wt 142 lb (64.4 kg)   BMI 21.59 kg/m   Opioid Risk Score:   Fall Risk Score:  `1  Depression screen PHQ 2/9  Depression screen [facility] 2/9 05/18/2017 04/27/2017 04/07/2017  Decreased Interest 0 0 1  Down, Depressed, Hopeless 0 0 1  PHQ - 2 Score 0 0 2  Altered  sleeping - - 2  Tired, decreased energy - - 1  Change in appetite - - 1  Feeling bad or failure about yourself  - - 1  Trouble concentrating - - 1  Moving slowly or fidgety/restless - - 1  Suicidal thoughts - - 0  PHQ-9 Score - -  9  Difficult doing work/chores - - Not difficult at all    Review of Systems  Musculoskeletal: Positive for gait problem.  All other systems reviewed and are negative.      Objective:   Physical Exam Awake, alert, appropriate, sitting on table, NAD L foot- rolling, goes into supination immediately- has better PF ability, but no improvement in DF- Can get ~ 85 degrees at most of DF but foot twisted because of lateral aspect of foot.        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 Suggest Dr. Neldon Mc or Dr Gershon Mussel- both Pediatric Orthopedics- [phone]. For Equinovarus  of L foot- needs evaluation for possible surgical intervention- pt has tried Botox at max dose with no significant improvement in dorsiflexion. Only issue is pt's wound healing issues- has a small 1.3 x 1.4 x 0.1 cm superficial wound on L medial malleolus.   2. Continue Botox until surgery possible.  3. Con't wound care as she does.  4. F/U in 2-3 weeks after next Botox injections.     I spent a total of 25 minutes on appointment- more than 15 minutes discussing options- of continuing Botox and/or doing surgery.

## 2019-05-13 ENCOUNTER — Encounter (HOSPITAL_BASED_OUTPATIENT_CLINIC_OR_DEPARTMENT_OTHER): Payer: Medicare Other | Attending: Internal Medicine | Admitting: Internal Medicine

## 2019-05-13 ENCOUNTER — Other Ambulatory Visit: Payer: Self-pay

## 2019-05-13 DIAGNOSIS — E10622 Type 1 diabetes mellitus with other skin ulcer: Secondary | ICD-10-CM | POA: Insufficient documentation

## 2019-05-13 DIAGNOSIS — L97219 Non-pressure chronic ulcer of right calf with unspecified severity: Secondary | ICD-10-CM | POA: Insufficient documentation

## 2019-05-13 DIAGNOSIS — L97322 Non-pressure chronic ulcer of left ankle with fat layer exposed: Secondary | ICD-10-CM | POA: Diagnosis not present

## 2019-05-13 DIAGNOSIS — I83212 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 and inflammation: Secondary | ICD-10-CM | POA: Diagnosis not present

## 2019-05-16 ENCOUNTER — Encounter: Payer: 59 | Admitting: Pain Medicine

## 2019-05-16 NOTE — Progress Notes (Signed)
SHELSEA, HANGARTNER ([id number]) Visit Report for 05/13/2019 Arrival Information Details Patient Name: Date of Service: [name], [name] 05/13/2019 10:00 AM Medical Record [id number] Patient Account Number: [id number] Date of Birth/Sex: Treating RN: Apr 19, 1956 (63 y.o. [name], [name] Primary Care Mirl Hillery: [name] Other Clinician: Referring Mekai Wilkinson: Treating Vyla Pint/Extender:Robson, Tawny Asal in Treatment: 335 Visit Information History Since Last Visit Cane Added or deleted any medications: No Patient Arrived: 10:00 Any new allergies or adverse reactions: No Arrival Time: Had a fall or experienced change in No Accompanied By: self None activities of daily living that may affect Transfer Assistance: risk of falls: Patient Identification Verified: Yes Signs or symptoms of abuse/neglect since last No Secondary Verification Process Completed: Yes visito Patient Requires Transmission-Based No Hospitalized since last visit: No Precautions: Implantable device outside of the clinic excluding No Patient Has Alerts: No cellular tissue based products placed in the center since last visit: Has Dressing in Place as Prescribed: Yes Has Compression in Place as Prescribed: Yes Pain Present Now: No Electronic Signature(s) Signed: 05/13/2019 6:25:32 PM By: Deon Pilling Entered By: Deon Pilling on 05/13/2019 10:13:57 -------------------------------------------------------------------------------- Compression Therapy Details Patient Name: Date of Service: [name], [name] 05/13/2019 10:00 AM Medical Record [id number] Patient Account Number: [id number] Date of Birth/Sex: Treating RN: 16-Jun-1955 (63 y.o. [name] Primary Care Herberto Ledwell: [name] Other Clinician: Referring Etherine Mackowiak: Treating Sharise Lippy/Extender:Robson, Tawny Asal in Treatment: 335 Compression Therapy Performed for Wound Wound #3 Left,Medial  Malleolus Assessment: Performed By: Clinician Deon Pilling, RN Compression Type: Rolena Infante Post Procedure Diagnosis Same as Pre-procedure Electronic Signature(s) Signed: 05/16/2019 5:56:42 PM By: Kela Millin Entered By: Kela Millin on 05/13/2019 10:40:35 -------------------------------------------------------------------------------- Encounter Discharge Information Details Patient Name: Date of Service: [name] 05/13/2019 10:00 AM Medical Record [id number] Patient Account Number: [id number] Date of Birth/Sex: Treating RN: 03-23-1956 (63 y.o. [name] Primary Care Montavius Subramaniam: [name] Other Clinician: Referring Delesia Martinek: Treating Arvilla Salada/Extender:Robson, Tawny Asal in Treatment: [phone] Encounter Discharge Information Items Post Procedure Vitals Discharge Condition: Stable Temperature (F): 97.9 Ambulatory Status: Cane Pulse (bpm): 75 Discharge Destination: Home Respiratory Rate (breaths/min): 20 Transportation: Private Auto Blood Pressure (mmHg): 99/51 Accompanied By: self Schedule Follow-up Appointment: Yes Clinical Summary of Care: Electronic Signature(s) Signed: 05/13/2019 6:25:32 PM By: Deon Pilling Entered By: Deon Pilling on 05/13/2019 10:56:59 -------------------------------------------------------------------------------- Lower Extremity Assessment Details Patient Name: Date of Service: [name], [name] 05/13/2019 10:00 AM Medical Record [id number] Patient Account Number: [id number] Date of Birth/Sex: Treating RN: 24-Nov-1955 (63 y.o. [name] Primary Care Mavryk Pino: [name] Other Clinician: Referring Chaston Bradburn: Treating Aboubacar Matsuo/Extender:Robson, Velva Harman, Essie Christine in Treatment: 335 Edema Assessment Assessed: [Left: No] [Right: No] Edema: [Left: Ye] [Right: s] Calf Left: Right: Point of Measurement: 33 cm From Medial Instep 31 cm cm Ankle Left: Right: Point of Measurement:  12 cm From Medial Instep 21 cm cm Vascular Assessment Pulses: Dorsalis Pedis Palpable: [Left:Yes] Electronic Signature(s) Signed: 05/13/2019 6:25:32 PM By: Deon Pilling Entered By: Deon Pilling on 05/13/2019 10:07:50 -------------------------------------------------------------------------------- Multi Wound Chart Details Patient Name: Date of Service: [name] 05/13/2019 10:00 AM Medical Record [id number] Patient Account Number: [id number] Date of Birth/Sex: Treating RN: [birth year] (63 y.o. F) Primary Care Aliese Brannum: [name] Other Clinician: Referring Samanthajo Payano: Treating Haley Fuerstenberg/Extender:Robson, Velva Harman, Essie Christine in Treatment: 335 Vital Signs Height(in): 68 Pulse(bpm): 75 Weight(lbs): 127 Blood Pressure(mmHg): 99/51 Body Mass Index(BMI): 19 Temperature(F): 97.9 Respiratory 18 Rate(breaths/min): Photos: [3:No Photos] [N/A:N/A] Wound Location: [3:Left Malleolus - Medial] [N/A:N/A] Wounding Event: [3:Gradually Appeared] [N/A:N/A]  Primary Etiology: [3:Venous Leg Ulcer] [N/A:N/A] Comorbid History: [3:Congestive Heart Failure, Peripheral Vascular Disease, Congestive Heart Failure, End Stage Renal Disease, Tobacco Use, Chronic Obstructive Pulmonary Disease (COPD), Type 1 Diabetes] [N/A:N/A] Date Acquired: [3:11/15/2012] [N/A:N/A] Weeks of Treatment: [3:335] [N/A:N/A] Wound Status: [3:Open] [N/A:N/A] Measurements L x W x D 1.9x1.5x0.1 [N/A:N/A] (cm) Area (cm) : [3:2.238] [N/A:N/A] Volume (cm) : [3:0.224] [N/A:N/A] % Reduction in Area: [3:28.80%] [N/A:N/A] % Reduction in Volume: [3:64.30%] [N/A:N/A] Classification: [3:Full Thickness Without Exposed Support Structures] [N/A:N/A] Exudate Amount: [3:Medium] [N/A:N/A] Exudate Type: [3:Serosanguineous] [N/A:N/A] Exudate Color: [3:red, brown] [N/A:N/A] Wound Margin: [3:Distinct, outline attached N/A] Granulation Amount: [3:Medium (34-66%)] [N/A:N/A] Granulation Quality: [3:Red, Pink]  [N/A:N/A] Necrotic Amount: [3:Medium (34-66%)] [N/A:N/A] Exposed Structures: [3:Fat Layer (Subcutaneous N/A Tissue) Exposed: Yes Fascia: No Tendon: No Muscle: No Joint: No Bone: No] Epithelialization: [3:Small (1-33%)] [N/A:N/A] Debridement: [3:Debridement - Excisional N/A] Pre-procedure [3:10:37] [N/A:N/A] Verification/Time Out Taken: Pain Control: [3:Other] [N/A:N/A] Tissue Debrided: [3:Subcutaneous, Slough] [N/A:N/A] Level: [3:Skin/Subcutaneous Tissue] [N/A:N/A] Debridement Area (sq cm):2.85 [N/A:N/A] Instrument: [3:Curette] [N/A:N/A] Bleeding: [3:Minimum] [N/A:N/A] Hemostasis Achieved: [3:Pressure] [N/A:N/A] Procedural Pain: [3:2] [N/A:N/A] Post Procedural Pain: [3:0] [N/A:N/A] Debridement Treatment Procedure was tolerated [N/A:N/A] Response: [3:well] Post Debridement [3:1.9x1.5x0.1] [N/A:N/A] Measurements L x W x D (cm) Post Debridement [3:0.224] [N/A:N/A] Volume: (cm) Procedures Performed: Compression Therapy [3:Debridement] [N/A:N/A] Treatment Notes Electronic Signature(s) Signed: 05/13/2019 6:34:01 PM By: Linton Ham MD Entered By: Linton Ham on 05/13/2019 10:42:44 -------------------------------------------------------------------------------- Multi-Disciplinary Care Plan Details Patient Name: Date of Service: [name] 05/13/2019 10:00 AM Medical Record [id number] Patient Account Number: [id number] Date of Birth/Sex: Treating RN: 08-19-1955 (63 y.o. [name] Primary Care Rachael Ferrie: [name] Other Clinician: Referring Jakyri Brunkhorst: Treating Salomon Ganser/Extender:Robson, Tawny Asal in Treatment: [phone] Active Inactive Venous Leg Ulcer Nursing Diagnoses: Actual venous Insuffiency (use after diagnosis is confirmed) Goals: Patient will maintain optimal edema control Date Initiated: 09/04/2017 Target Resolution Date: 06/10/2019 Goal Status: Active Patient/caregiver will verbalize understanding of disease process and  disease management Date Initiated: 08/03/2014 Date Inactivated: 08/28/2017 Target Resolution Date: 08/29/2017 Goal Status: Met Interventions: Compression as ordered Treatment Activities: Therapeutic compression applied : 09/21/2015 Notes: Wound/Skin Impairment Nursing Diagnoses: Impaired tissue integrity Knowledge deficit related to ulceration/compromised skin integrity Goals: Patient will have a decrease in wound volume by X% from date: (specify in notes) Date Initiated: 04/05/2015 Date Inactivated: 02/12/2017 Target Resolution Date: 09/01/2016 Unmet Reason: chronic, Goal Status: Unmet unable to tolerate stronger compression Patient/caregiver will verbalize understanding of skin care regimen Date Initiated: 04/05/2015 Target Resolution Date: 06/10/2019 Goal Status: Active Interventions: Assess patient/caregiver ability to obtain necessary supplies Assess patient/caregiver ability to perform ulcer/skin care regimen upon admission and as needed Assess ulceration(s) every visit Provide education on ulcer and skin care Notes: Electronic Signature(s) Signed: 05/16/2019 5:56:42 PM By: Kela Millin Entered By: Kela Millin on 05/13/2019 10:46:30 -------------------------------------------------------------------------------- Pain Assessment Details Patient Name: Date of Service: [name], [name] 05/13/2019 10:00 AM Medical Record [id number] Patient Account Number: [id number] Date of Birth/Sex: Treating RN: 1955-11-25 (63 y.o. [name] Primary Care Donia Yokum: [name] Other Clinician: Referring Masa Lubin: Treating Roberta Kelly/Extender:Robson, Tawny Asal in Treatment: 335 Active Problems Location of Pain Severity and Description of Pain Patient Has Paino No Site Locations Rate the pain. Current Pain Level: 0 Pain Management and Medication Current Pain Management: Medication: No Cold Application: No Rest: No Massage: No Activity:  No T.E.N.S.: No Heat Application: No Leg drop or elevation: No Is the Current Pain Management Adequate: Adequate How does your wound impact your activities of daily livingo Sleep: No  Bathing: No Appetite: No Relationship With Others: No Bladder Continence: No Emotions: No Bowel Continence: No Work: No Toileting: No Drive: No Dressing: No Hobbies: No Electronic Signature(s) Signed: 05/13/2019 6:25:32 PM By: Deon Pilling Entered By: Deon Pilling on 05/13/2019 10:07:38 -------------------------------------------------------------------------------- Patient/Caregiver Education Details Patient Name: Date of Service: [name] 12/11/2020andnbsp10:00 AM Medical Record [id number] Patient Account Number: [id number] Date of Birth/Gender: Treating RN: 29-May-1956 (63 y.o. [name] Primary Care Physician: [name] Other Clinician: Referring Physician: Treating Physician/Extender:Robson, Tawny Asal in Treatment: [id number] Education Assessment Education Provided To: Patient Education Topics Provided Wound/Skin Impairment: Methods: Explain/Verbal Responses: State content correctly Electronic Signature(s) Signed: 05/16/2019 5:56:42 PM By: Kela Millin Entered By: Kela Millin on 05/13/2019 10:46:47 -------------------------------------------------------------------------------- Wound Assessment Details Patient Name: Date of Service: [name], [name] 05/13/2019 10:00 AM Medical Record [id number] Patient Account Number: [id number] Date of Birth/Sex: Treating RN: [birth year] (63 y.o. [name], [name] Primary Care Chrishelle Zito: [name] Other Clinician: Referring Arpita Fentress: Treating Sayf Kerner/Extender:Robson, Velva Harman, Essie Christine in Treatment: 335 Wound Status Wound Number: 3 Primary Venous Leg Ulcer Etiology: Wound Location: Left Malleolus - Medial Wound Open Wounding Event: Gradually Appeared Status: Date  Acquired: 11/15/2012 Comorbid Congestive Heart Failure, Peripheral Vascular Weeks Of Treatment: 335 History: Disease, Congestive Heart Failure, End Stage Clustered Wound: No Renal Disease, Tobacco Use, Chronic Obstructive Pulmonary Disease (COPD), Type 1 Diabetes Photos Wound Measurements Length: (cm) 1.9 % Red Width: (cm) 1.5 % Red Depth: (cm) 0.1 Epith Area: (cm) 2.238 Tunn Volume: (cm) 0.224 Unde Wound Description Full Thickness Without Exposed Support Classification: Structures Wound Distinct, outline attached Margin: Exudate Medium Amount: Exudate Serosanguineous Type: Exudate red, brown Color: Wound Bed Granulation Amount: Medium (34-66%) Granulation Quality: Red, Pink Necrotic Amount: Medium (34-66%) Necrotic Quality: Adherent Slough Foul Odor After Cleansing: No Slough/Fibrino Yes Exposed Structure Fascia Exposed: No Fat Layer (Subcutaneous Tissue) Exposed: Yes Tendon Exposed: No Muscle Exposed: No Joint Exposed: No Bone Exposed: No uction in Area: 28.8% uction in Volume: 64.3% elialization: Small (1-33%) eling: No rmining: No Treatment Notes Wound #3 (Left, Medial Malleolus) 1. Cleanse With Soap and water 2. Periwound Care Barrier cream TCA Ointment 3. Primary Dressing Applied Other primary dressing (specifiy in notes) 4. Secondary Dressing Dry Gauze Foam Kerramax/Xtrasorb 6. Support Layer Teacher, English as a foreign language Boot Notes [place]. no kerlix. primary dressing sorbact. foam to anterior lower leg for protection. Electronic Signature(s) Signed: 05/16/2019 3:44:56 PM By: Mikeal Hawthorne EMT/HBOT Signed: 05/16/2019 5:58:58 PM By: Deon Pilling Previous Signature: 05/13/2019 6:25:32 PM Version By: Deon Pilling Entered By: Mikeal Hawthorne on 05/16/2019 14:57:32 -------------------------------------------------------------------------------- Vitals Details Patient Name: Date of Service: [name] 05/13/2019 10:00 AM Medical Record  [id number] Patient Account Number: [id number] Date of Birth/Sex: Treating RN: 1956-03-05 (63 y.o. [name] Primary Care Erinn Huskins: [name] Other Clinician: Referring Mattalynn Crandle: Treating Avis Tirone/Extender:Robson, Tawny Asal in Treatment: 335 Vital Signs Time Taken: 10:00 Temperature (F): 97.9 Height (in): 68 Pulse (bpm): 75 Weight (lbs): 127 Respiratory Rate (breaths/min): 18 Body Mass Index (BMI): 19.3 Blood Pressure (mmHg): 99/51 Reference Range: 80 - 120 mg / dl Electronic Signature(s) Signed: 05/13/2019 6:25:32 PM By: Deon Pilling Entered By: Deon Pilling on 05/13/2019 10:04:42

## 2019-05-16 NOTE — Progress Notes (Signed)
[name], [name] ([id number]) Visit Report for 05/13/2019 Debridement Details Patient Name: Date of Service: [name], [name] 05/13/2019 10:00 AM Medical Record [id number] Patient Account Number: [id number] Date of Birth/Sex: 06/30/1955 (63 y.o. F) Treating RN: Primary Care Provider: [name] Other Clinician: Referring Provider: Treating Provider/Extender:Rowyn Spilde, Tawny Asal in Treatment: 335 Debridement Performed for Wound #3 Left,Medial Malleolus Assessment: Performed By: Physician [name]., MD Debridement Type: Debridement Severity of Tissue Pre Fat layer exposed Debridement: Level of Consciousness (Pre- Awake and Alert procedure): Pre-procedure Verification/Time Out Taken: Yes - 10:37 Start Time: 10:37 Pain Control: Other : benzocaine, 20% Total Area Debrided (L x W): 1.9 (cm) x 1.5 (cm) = 2.85 (cm) Tissue and other material Viable, Non-Viable, Slough, Subcutaneous, Slough debrided: Level: Skin/Subcutaneous Tissue Debridement Description: Excisional Instrument: Curette Bleeding: Minimum Hemostasis Achieved: Pressure End Time: 10:38 Procedural Pain: 2 Post Procedural Pain: 0 Response to Treatment: Procedure was tolerated well Level of Consciousness Awake and Alert (Post-procedure): Post Debridement Measurements of Total Wound Length: (cm) 1.9 Width: (cm) 1.5 Depth: (cm) 0.1 Volume: (cm) 0.224 Character of Wound/Ulcer Post Improved Debridement: Severity of Tissue Post Debridement: Fat layer exposed Post Procedure Diagnosis Same as Pre-procedure Electronic Signature(s) Signed: 05/13/2019 6:34:01 PM By: Linton Ham MD Entered By: Linton Ham on 05/13/2019 10:42:56 -------------------------------------------------------------------------------- HPI Details Patient Name: Date of Service: [name] 05/13/2019 10:00 AM Medical Record [id number] Patient Account Number: [id number] Date of  Birth/Sex: Treating RN: 01-19-1956 (63 y.o. F) Primary Care Provider: [name] Other Clinician: Referring Provider: Treating Provider/Extender:Laithan Conchas, Tawny Asal in Treatment: 335 History of Present Illness HPI Description: the remaining wound is over the left medial ankle. Similar wound over the right medial ankle healed largely with use of Apligraf. Most recently we have been using Hydrofera Blue over this wound with considerable improvement. The patient has been extensively worked up in the past for her venous insufficiency and she is not a candidate for antireflux surgery although I have none of the details available currently. 08/24/14; considerable improvement today. About 50% of this wound areas now epithelialized. The base of the wound appears to be healthier granulation.as opposed to last week when she had deteriorated a considerable improvement 08/17/14; unfortunately the wound has regressed somewhat. The areas of epithelialization from the superior aspect are not nearly as healthy as they were last week. The patient thinks her Hydrofera Blue slipped. 09/07/14; unfortunately the area has markedly regressed in the 2 weeks since I've seen this. There is an odor surrounding erythema. The healthy granulation tissue that we had at the base of the wound now is a dusky color. The nurse reports green drainage 09/14/14; the area looks somewhat better than last week. There is less erythema and less drainage. The culture I did did not show any growth. Nevertheless I think it is better to continue the Cipro and doxycycline for a further week. The remaining wound area was debridement. 09/21/14. Wound did not require debridement last week. Still less erythema and less drainage. She can complete her antibiotics. The areas of epithelialization in the superior aspect of the wound do not look as healthy as they did some weeks ago 10/05/14 continued improvement in the condition of  this wound. There is advancing epithelialization. Less aggressive debridement required 10/19/14 continued improvement in the condition and volume of this wound. Less aggressive debridement to the inferior part of this to remove surface slough and fibrinous eschar 11/02/14 no debridement is required. The surface granulation appears healthy although some of her  islands of epithelialization seem to have regressed. No evidence of infection 11/16/14; lites surface debridement done of surface eschar. The wound does not look to be unhealthy. No evidence of infection. Unfortunately the patient has had podiatry issues in the right foot and for some reason has redeveloped small surface ulcerations in the medial right ankle. Her original presentation involved wounds in this area 11/23/14 no debridement. The area on the right ankle has enlarged. The left ankle wound appears stable in terms of the surface although there is periwound inflammation. There has been regression in the amount of new skin 11/30/14 no debridement. Both wound areas appear healthy. There was no evidence of infection. The the new area on the right medial ankle has enlarged although that both the surfaces appear to be stable. 12/07/14; Debridement of the right medial ankle wound. No no debridement was done on the left. 12/14/14 no major change in and now bilateral medial ankle wounds. Both of these are very painful but the no overt evidence of infection. She has had previous venous ablation 12/21/14; patient states that her right medial ankle wound is considerably more painful last week than usual. Her left is also somewhat painful. She could not tolerate debridement. The right medial ankle wound has fibrinous surface eschar 12/28/14 this is a patient with severe bilateral venous insufficiency ulcers. For a considerable period of time we actually had the one on the right medial ankle healed however this recently opened up again in June. The left  medial ankle wound has been a refractory area with some absent flows. We had some success with Hydrofera Blue on this area and it literally closed by 50% however it is recently opened up Foley. Both of these were debridement today of surface eschar. She tolerates this poorly 01/25/15: No change in the status of this. Thick adherent escar. Very poor tolerance of any attempt at debridement. I had healed the right medial malleolus wound for a considerable amount of time and had the left one down to about 50% of the volume although this is totally regressed over the last 48 weeks. Further the right leg has reopened. she is trying to make a appointment with pain and vascular, previous ablations with Dr. Aleda Grana. I do not believe there is an arterial insufficiency issue here 02/01/15 the status of the adherent eschar bilaterally is actually improved. No debridement was done. She did not manage to get vascular studies done 02/08/15 continued debridement of the area was done today. The slough is less adherent and comes off with less pressure. There is no surrounding infection peripheral pulses are intact 02/15/15 selective debridement with a disposable curette. Again the slough is less adherent and comes off with less difficulty. No surrounding infection peripheral pulses are intact. 02/22/15 selective debridement of the right medial ankle wound. Slough comes off with less difficulty. No obvious surrounding infection peripheral pulses are intact I did not debridement the one on the left. Both of these are stable to improved 03/01/15 selective debridement of both wound areas using a curette to. Adherent slough cup soft with less difficulty. No obvious surrounding infection. The patient tells me that 2 days ago she noted a rash above the right leg wrap. She did not have this on her lower legs when she change this over she arrives with widespread left greater than right almost folliculitis-looking rash which  is extremely pruritic. I don't see anything to culture here. There is no rash on the rest of her body. She feels well  systemically. 03/08/15; selective debridement of both wounds using a curette. Base of this does not look unhealthy. She had limegreen drainage coming out of the left leg wound and describes a lot of drainage. The rash on her left leg looks improved to. No cultures were done. 03/22/15; patient was not here last week. Basal wounds does not look healthy and there is no surrounding erythema. No drainage. There is still a rash on the left leg that almost looks vasculitic however it is clearly limited to the top of where the wrap would be. 04/05/15; on the right required a surgical debridement of surface eschar and necrotic subcutaneous tissue. I did not debridement the area on the left. These continue to be large open wounds that are not changing that much. We were successful at one point in healing the area on the right, and at the same time the area on the left was roughly half the size of current measurements. I think a lot of the deterioration has to do with the prolonged time the patient is on her feet at work 04/19/15 I attempted-like surface debridement bilaterally she does not tolerate this. She tells me that she was in allergic care yesterday with extreme pain over her left lateral malleolus/ankle and was told that she has an "sprain" 05/03/15; large bilateral venous insufficiency wounds over the medial malleolus/medial aspect of her ankles. She complains of copious amounts of drainage and his usual large amounts of pain. There is some increasing erythema around the wound on the right extending into the medial aspect of her foot to. historically she came in with these wounds the right one healed and the left one came down to roughly half its current size however the right one is reopened and the left is expanded. 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 05/10/15 large bilateral venous insufficiency wounds. There is less adherence surface left however the surface culture that I did last week grew pseudomonas therefore bilateral selective debridement score necessary. There is surrounding erythema. The patient describes severe bilateral drainage and a lot of pain in the left ankle. Apparently her podiatrist was were ready to do a cortisone shot 05/17/15; the patient complains of pain and again copious amounts of drainage. 05/24/15; we used Iodo flex last week. Patient notes considerable improvement in wound drainage. Only needed to change this once. 05/31/15; we continued Iodoflex; the base of these large wounds bilaterally is not too bad but there is probably likely a significant bioburden here. I would like to debridement just doesn't tolerate it. 06/06/14 I would like to continue the Iodoflex although she still hasn't managed to obtain supplies. She has bilateral medial malleoli or large wounds which are mostly superficial. Both of them are covered circumferentially with some nonviable fibrinous slough although she tolerates debridement very poorly. She apparently has an appointment for an ablation on the right leg by interventional radiology. 06/14/15; the patient arrives with the wounds and static condition. We attempted a debridement although she does not do well with this secondary to pain. I 07/05/15; wounds are not much smaller however there appears to be a cleaner granulating base. The left has tight fibrinous slough greater than the right. Debridement is tolerated poorly due to pain. Iodoflex is done more for these wounds in any of the multitude of different dressings I have tried on the left 1 and then subsequently the right. 07/12/15; no change in the  condition of this wound. I am able to do an aggressive debridement on the right but not the left. She simply cannot tolerate it. We have been using Iodoflex which helps  somewhat. It is worthwhile remembering that at one point we healed the right medial ankle wound and the left was about 25% of the current circumference. We have suggested returning to vascular surgery for review of possible further ablations for one reason or another she has not been able to do this. 07/26/15 no major change in the condition of either wound on her medial ankle. I did not attempt to debridement of these. She has been aggressively scrubbing these while she is in the shower at home. She has her supply of Iodoflex which seems to have done more for these wounds then anything I have put on recently. 08/09/15 wound area appears larger although not verified by measurements. Using Iodoflex 09/05/2015 -- she was here for avisit today but had significant problems with the wound and I was asked to see her for a physician opinion. I have summarize that this lady has had surgery on her left lower extremity about 10 years ago where the possible veins stripping was done. She has had an opinion from interventional radiology around November 2016 where no further sclerotherapy was ordered. The patient works 12 hours a day and stands on a concrete floor with work boots and is unable to get the proper compression she requires and cannot elevate her limbs appropriately at any given time. She has recently grown Pseudomonas from her wound culture but has not started her ciprofloxacin which was called in for her. 09/13/15 this continues to be a difficult situation for this patient. At one point I had this wound down to a 1.5 x 1.5" wound on her left leg. This is deteriorated and the right leg has reopened. She now has substantial wounds on her medial calcaneus, malleoli and into her lower leg. One on the left has surface eschar but these are far too painful for me to debridement here. She has a vascular surgery appointment next week to see if anything can be done to help here. I think she has had previous  ablations several years ago at [facility]. She has no major edema. She tells me that she did not get product last time [facility]] and went for several days without it. She continues to work in work boots 12 hours a day. She cannot get compression/4-layer under her work boots. 09/20/15 no major change. Periwound edema control was not very good. Her point with pain and vascular is next Wednesday the 25th 09/28/15; the patient is seen vascular surgery and is apparently scheduled for repeat duplex ultrasounds of her bilateral lower legs next week. 10/05/15; the patient was seen by Dr. Doren Custard of vascular surgery. He feels that she should have arterial insufficiency excluded as cause/contributed to her nonhealing stage she is therefore booked for an arteriogram. She has apparently monophasic signals in the dorsalis pedis pulses. She also of course has known severe chronic venous insufficiency with previous procedures as noted previously. I had another long discussion with the patient today about her continuing to work 12 hour shifts. I've written her out for 2 months area had concerns about this as her work location is currently undergoing significant turmoil and this may lead to her termination. She is aware of this however I agree with her that she simply cannot continue to stand for 12 hours multiple days a week with the  substantial wound areas she has. 10/19/15; the Dr. Doren Custard appointment was largely for an arteriogram which was normal. She does not have an arterial issue. He didn't make a comment about her chronic venous insufficiency for which she has had previous ablations. Presumably it was not felt that anything additional could be done. The patient is now out of work as I prescribed 2 weeks ago. Her wounds look somewhat less aggravated presumably because of this. I felt I would give debridement another try today 10/25/15; no major change in this patient's wounds. We are struggling to get her  product that she can afford into her own home through her insurance. 11/01/15; no major change in the patient's wounds. I have been using silver alginate as the most affordable product. I spoke to Dr. Marla Roe last week with her requested take her to the OR for surgical debridement and placement of ACEL. Dr. Marla Roe told me that she would be willing to do this however [facility] will not cover this, fortunately the patient has [place] healthcare of some variant 11/08/15; no major change in the patient's wounds. She has been completely nonviable surface that this but is in too much pain with any attempted debridement are clinic. I have arranged for her to see Dr. Marla Roe ham of plastic surgery and this appointment is on Monday. I am hopeful that they will take her to the OR for debridement, possible ACEL ultimately possible skin graft 11/22/15 no major change in the patient's wounds over her bilateral medial calcaneus medial malleolus into the lower legs. Surface on these does not look too bad however on the left there is surrounding erythema and tenderness. This may be cellulitis or could him sleepy tinea. 11/29/15; no major changes in the patient's wounds over her bilateral medial malleolus. There is no infection here and I don't think any additional antibiotics are necessary. There is now plan to move forward. She sees Dr. Marla Roe in a week's time for preparation for operative debridement and ACEL placement I believe on 7/12. She then has a follow-up appointment with Dr. Marla Roe on 7/21 12/28/15; the patient returns today having been taken to the [facility] by Dr. Marla Roe 12/12/15 she underwent debridement, intraoperative cultures [which were negative]. She had placement of a wound VAC. Parent really ACEL was not available to be placed. The wound VAC foam apparently adhered to the wound since then she's been using silver alginate, Xeroform under Ace wraps. She still says there is a  lot of drainage and a lot of pain 01/31/16; this is a patient I see monthly. I had referred her to Dr. Marla Roe him of plastic surgery for large wounds on her bilateral medial ankles. She has been to the OR twice once in early July and once in early August. She tells me over the last 3 weeks she has been using the wound VAC with ACEL underneath it. On the right we've simply been using silver alginate. Under Kerlix Coban wraps. 02/28/16; this is a patient I'm currently seeing monthly. She is gone on to have a skin graft over her large venous insufficiency ulcer on the left medial ankle. This was done by Dr. Marla Roe him. The patient is a bit perturbed about why she didn't have one on her right medial ankle wound. She has been using silver alginate to this. 03/06/16; I received a phone call from her plastic surgery Dr. Marla Roe. She expressed some concern about the viability of the skin graft she did on the left medial  ankle wound. Asked me to place Endoform on this. She told me she is not planning to do a subsequent skin graft on the right as the left one did not take very well. I had placed Hydrofera Blue on the right 03/13/16; continue to have a reasonably healthy wound on the right medial ankle. Down to 3 mm in terms of size. There is epithelialization here. The area on the left medial ankle is her skin graft site. I suppose the last week this looks somewhat better. She has an open area inferiorly however in the center there appears to be some viable tissue. There is a lot of surface callus and eschar that will eventually need to come off however none of this looked to be infected. Patient states that the is able to keep the dressing on for several days which is an improvement. 03/20/16 no major change in the circumference of either wound however on the left side the patient was at Dr. Eusebio Friendly office and they did a debridement of left wound. 50% of the wound seems to be epithelialized. I  been using Endoform on the left Hydrofera Blue in the right 03/27/16; she arrives today with her wound is not looking as healthy as they did last week. The area on the right clearly has an adherent surface to this a very similar surface on the left. Unfortunately for this patient this is all too familiar problem. Clearly the Endoform is not working and will need to change that today that has some potential to help this surface. She does not tolerate debridement in this clinic very well. She is changing the dressing wants 04/03/16; patient arrives with the wounds looking somewhat better especially on the right. Dr. Migdalia Dk change the dressing to silver alginate when she saw her on Monday and also sold her some compression socks. The usefulness of the latter is really not clear and woman with severely draining wounds. 04/10/16; the patient is doing a bit of an experiment wearing the compression stockings that Dr. Migdalia Dk provided her to her left leg and the out of legs based dressings that we provided to the right. 05/01/16; the patient is continuing to wear compression stockings Dr. Migdalia Dk provided her on the left that are apparently silver impregnated. She has been using Iodoflex to the right leg wound. Still a moderate amount of drainage, when she leaves here the wraps only last for 4 days. She has to change the stocking on the left leg every night 05/15/16; she is now using compression stockings bilaterally provided by Dr. Marla Roe. She is wearing a nonadherent layer over the wounds so really I don't think there is anything specific being done to this now. She has some reduction on the left wound. The right is stable. I think all healing here is being done without a specific dressing 06/09/16; patient arrives here today with not much change in the wound certainly in diameter to large circular wounds over the medial aspect of her ankle bilaterally. Under the light of these services are certainly not  viable for healing. There is no evidence of surrounding infection. She is wearing compression stockings with some sort of silver impregnation as prescribed by Dr. Marla Roe. She has a follow-up with her tomorrow. 06/30/16; no major change in the size or condition of her wounds. These are still probably covered with a nonviable surface. She is using only her purchase stockings. She did see Dr. Marla Roe who seemed to want to apply Dakin's solution to this I'm not  extreme short what value this would be. I would suggest Iodoflex which she still has at home. 07/28/16; I follow [name] episodically along with Dr. Marla Roe. 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 She has been applying a topical collagen ointment to both wounds with Adaptic. I don't think Dr. Marla Roe is planning to take her back to the OR. 08/19/16; I follow [name] on a monthly basis along with Dr. Marla Roe of plastic surgery. 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 I been following her for a number of years. At one point I was able to get the right medial malleolus wound to heal and had the left medial malleolus down to about half its current size however and I had to send her to plastic surgery for an operative debridement. Since then things have been stable to slightly improve the area on the right is slightly better one in the left about the same although there is much less adherent surface than I'm used to with this patient. She is using some form of liquid collagen gel that Dr. Marla Roe provided a Kerlix cover with the patient's own pressure stockings. She tells me that she has extreme pain in both ankles and along the lateral aspect of both feet. She has been unable to work for some period of time. She is telling me she is retiring at the beginning of April. She sees Dr. Doran Durand of  orthopedics next week 09/22/16; patient has not seen Dr. Marla Roe since the last time she is here. I'm not really sure what she is using to the wounds other than bits and pieces of think she had left over including most recently Hydrofera Blue. She is using juxtalite stockings. She is having difficulty with her husband's recent illness "stroke". She is having to transport him to various doctors appointments. Dr. Marla Roe left her the option of a repeat debridement with ACEL however she has not been able to get the time to follow-up on this. She continues to have a fair amount of drainage out of these wounds with certainly precludes leaving dressings on all week 10/13/16; patient has not seen Dr. Marla Roe since she was last in our clinic. I'm not really sure what she is doing with the wounds, we did try to get her [facility] and I think she is actually using this most of the time. Because of drainage she states she has to change this every second day although this is an improvement from what she used to do. She went to see Dr. Doran Durand who did not think she had a muscular issue with regards to her feet, he referred her to a neurologist and I think the appointment is sometime in June. I changed her back to Iodoflex which she has used in the past but not recently. 11/03/16; the patient has been using Iodoflex although she ran out of this. Still claims that there is a lot of drainage although the wound does not look like this. No surrounding erythema. She has not been back to see Dr. Marla Roe 11/24/16; the patient has been using Iodoflex again but she ran out of it 2 or 3 days ago. There is no major change in the condition of either one of these wounds in fact they are larger and covered in a thick adherent surface slough/nonviable tissue especially on the left. She does not tolerate mechanical debridement in our clinic.  Going back to see Dr. Marla Roe of plastic surgery for an operative  debridement would seem reasonable. 12/15/16; the patient has not been back to see Dr. Marla Roe. She is been dealing with a series of illnesses and her husband which of monopolized her time. She is been using Sorbact which we largely supplied. She states the drainage is bad enough that it maximum she can go 2-3 days without changing the dressing 01/12/2017 -- the patient has not been back for about 4 weeks and has not seen Dr. Marla Roe not does she have any appointment pending. 01/23/17; patient has not seen Dr. Marla Roe even though I suggested this previously. She is using Santyl that was suggested last week by Dr. Con Memos this Cost her $16 through her insurance which is indeed surprising 02/12/17; continuing Santyl and the patient is changing this daily. A lot of drainage. She has not been back to see plastic surgery she is using an Ace wrap. Our intake nurse suggested wrap around stockings which would make a good reasonable alternative 02/26/17; patient is been using Santyl and changing this daily due to drainage. She has not been to see plastic surgery she uses in April Ace wrap to control the edema. She did obtain extremitease stockings but stated that the edema in her leg was to big for these 03/20/17; patient is using Santyl and Anasept. Surfaces looked better today the area on the right is actually measuring a little smaller. She has states she has a lot of pain in her feet and ankles and is asking for a consult to pain control which I'll try to help her with through our case manager. 04/10/17; the patient arrives with better-looking wound surfaces and is slightly smaller wound on the left she is using a combination of Santyl and Anasept. She has an appointment or at least as started in the pain control center associated with [facility] 05/14/17; this is a patient who I followed for a prolonged period of time. 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 At  one point I had this down to a much smaller wound on the left however these reopened and we've never been able to get these to heal. She has been using Santyl and Anasept gel although 2 weeks ago she ran out of the Anasept gel. She has a stable appearance of the wound. She is going to the wound care clinic at [facility]. They wanted do a nerve block/spinal block although she tells me she is reluctant to go forward with that. 05/21/17; this is a patient I have followed for many years. 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 Chronic pain and deformity in her ankles as well. She is been to see plastic surgery as well as orthopedics. Using PolyMem AG most recently/Kerramax/ABDs and 2 layer compression. She has managed to keep this on and she is coming in for a nurse check to change the dressing on Tuesdays, we see her on Fridays 06/05/17; really quite a good looking surface and the area especially on the right medial has contracted in terms of dimensions. Well granulated healthy-looking tissue on both sides. Even with an open curet there is nothing that even feels abnormal here. This is as good as I've seen this in quite some time. We have been using PolyMem AG and bringing her in for a nurse check 06/12/17; really quite good surface on both of these wounds. The right medial has contracted a bit left is not. We've  been using PolyMem and AG and she is coming in for a nurse visit 06/19/17; we have been using PolyMem AG and bringing her in for a nurse check. Dimensions of her wounds are not better but the surfaces looked better bilaterally. She complained of bleeding last night and the left wound and increasing pain bilaterally. She states her wound pain is more neuropathic than just the wounds. There was some suggestion that this was radicular from her pain management doctor in talking to her it is really difficult to sort this out. 06/26/17; using PolyMem and AG and  bringing her in for a nurse check as All of this and reasonably stable condition. Certainly not improved. The dimensions on the lateral part of the right leg look better but not really measuring better. The medial aspect on the left is about the same. 07/03/16; we have been using PolyMen AG and bringing her in for a nurse check to change the dressings as the wounds have drainage which precludes once weekly changing. We are using all secondary absorptive dressings.our intake nurse is brought up the idea of using a wound VAC/snap VAC on the wound to help with the drainage to see if this would result in some contraction. This is not a bad idea. The area on the right medial is actually looking smaller. Both wounds have a reasonable-looking surface. There is no evidence of cellulitis. The edema is well controlled 07/10/17; the patient was denied for a snap VAC by her insurance. The major issue with these wounds continues to be drainage. We are using wicked PolyMem AG and she is coming in for a nurse visit to change this. The wounds are stable to slightly improved. The surface looks vibrant and the area on the right certainly has shrunk in size but very slowly 07/17/17; the patient still has large wounds on her bilateral medial malleoli. Surface of both of these wounds looks better. The dimensions seem to come and go but no consistent improvement. There is no epithelialization. We do not have options for advanced treatment products due to insurance issues. They did not approve of the wound VAC to help control the drainage. More recently we've been using PolyMem and AG wicked to allow drainage through. We have been bringing her in for a nurse visit to change this. We do not have a lot of options for wound care products and the home again due to insurance issues 07/24/17; the patient's wound actually looks somewhat better today. No drainage measurements are smaller still healthy-looking surface. We used silver  collagen under PolyMen started last week. We have been bringing her in for a dressing change 07/31/17; patient's wound surface continued to look better and I think there is visible change in the dimensions of the wound on the right. Rims of epithelialization. We have been using silver collagen under PolyMen and bringing her in for a dressing change. There appears to be less drainage although she is still in need of the dressing change 08/07/17. Patient's wound surface continues to look better on both sides and the area on the right is definitely smaller. We have been using silver collagen and PolyMen. She feels that the drainage has been it has been better. I asked her about her vascular status. She went to see Dr. Aleda Grana at [facility] and had some form of ablation. I don't have much detail on this. I haven't my notes from 2016 that she was not a candidate for any further ablation but I don't have  any more information on this. We had referred her to vein and vascular I don't think she ever went. He does not have a history of PAD although I don't have any information on this either. We don't even have ABIs in our record 08/14/17; we've been using silver collagen and PolyMen cover. And putting the patient and compression. She we are bringing her in as a nurse visit to change this because ofarge amount of drainage. We didn't the ABIs in clinic today since they had been done in many moons 1.2 bilaterally. She has been to see vein and vascular however this was at [facility] and she had ablation although I really don't have any information on this all seemed biking get a report. She is also been operatively debrided by plastic surgery and had a cell placed probably 8-12 months ago. This didn't have a major effect. We've been making some gains with current dressings 08/19/17-She is here in follow-up evaluation for bilateral medial malleoli ulcers. She continues to tolerate debridement very poorly.  We will continue with recently changed topical treatment; if no significant improvement may consider switching to Iodosorb/Iodoflex. 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 She has been using silver collagen and PolyMem. I believe she has been used and tried on Iodoflex before. 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 These have been very refractory over time. We have been using silver collagen and PolyMen. She is coming in once a week for a doctor's and once a week for nurse visits. We are actually making some progress 09/18/17; the patient's wounds are smaller especially on the right medial. She arrives today to upset to consider even washing these off with Anasept which I think is been part of the reason this is been closing. We've been using collagen covered in PolyMen otherwise. It is noted that she has a small area of folliculitis on the right medial calf that. As we are wrapping her legs I'll give her a short course of doxycycline to make sure this doesn't amount to anything. She is a long list of complaints today including imbalance, shortness of breath on exertion, inversion of her left ankle. With regards to the latter complaints she is been to see orthopedics and they offered her a tendon release surgery I believe but wanted her wounds to be closed first. I have recommended she go see her primary physician with regards to everything else. 09/25/17; patient's wounds are about the same size. We have made some progress bilaterally although not in recent weeks. She will not allow me T wash these wounds with Anasept even if she is doing her cell. Wheeze we've been using collagen covered in PolyMen. Last week she had a small area of folliculitis this  is now opened into a small wound. She completed 5 days of trimethoprim sulfamethoxazole 10/02/17; unfortunately the area on her left medial ankle is worse with a larger wound area towards the Achilles. The patient complains of a lot of pain. She will not allow debridement although visually I don't think there is anything to debridement in any case. We have been using silver collagen and PolyMen for several months now. Initially we are making some progress although I'm not really seeing that today. We will move back to [facility]. His  admittedly this is a bit of a repeat however I'm hoping that his situation is different now. The patient tells me she had her leg on the left give out on her yesterday this is process some pain. 10/09/17; the patient is seen twice a week largely because of drainage issues coming out of the chronic medial bimalleolar wounds that are chronic. Last week the dimensions of the one on the left looks a little larger I changed her to [facility]. She comes in today with a history of terrible pain in the bilateral wound areas. She will not allow debridement. She will not even allow a tissue culture. There is no surrounding erythema no no evidence of cellulitis. We have been putting her Kerlix Coban man. She will not allow more aggressive compression as there was a suggestion to put her in 3 layer wraps. 10/16/17; large wounds on her bilateral medial malleoli. These are chronic. Not much change from last week. The surface looks have healthy but absolutely no epithelialization. A lot of pain little less so of drainage. She will not allow debridement or even washing these off in the vigorous fashion with Anasept. 10/23/17; large wounds on her bilateral malleoli which are chronic. Some improvement in terms of size perhaps on the right since last time I saw these. She states that after we increased the 3 layer compression there was some bleeding, when she came in for a nurse  visit she did not want 3 layer compression put back on about our nurse managed to convince her. She has known chronic venous visit issues and I'm hoping to get her to tolerate the 3 layer compression. using Hydrofera Blue 10/30/17; absolutely no change in the condition of either wound although we've had some improvement in dimensions on the right.. Attempted to put her in 3 layer compression she didn't tolerated she is back in 2 layer compression. We've been using Hydrofera Blue We looked over her past records. She had venous reflux studies in November 2016. There was no evidence of deep venous reflux on the right. Superficial vein did not show the greater saphenous vein at think this is been previously ablated the small saphenous vein was within normal limits. The left deep venous system showed no DVT the vessels were positive for deep venous reflux in the posterior tibial veins at the ankle. The greater saphenous vein was surgically absent small saphenous vein was within normal limits. She went to vein and vascular at [facility]. I believe she had an ablation on the left greater saphenous vein. I'll update her reflux studies perhaps ever reviewed by vein and vascular. We've made absolutely no progress in these wounds. Will also try to read and TheraSkins through her insurance 11/06/17; W the patient apparently has a 2 week follow-up with vein and vascular I like him to review the whole issue with regards to her previous vascular workup by Dr. Aleda Grana. We've really made no progress on these wounds in many months. She arrives today with less viable looking surface on the left medial ankle wound. This was apparently looking about the same on Tuesday when she was here for nurse visit. 11/13/17; deep tissue culture I did last time of the left lower leg showed multiple organisms without any predominating. In particular no Staphylococcus or group A strep were isolated. We sent her for venous  reflux studies. She's had a previous left greater saphenous vein stripping and I think sclerotherapy of the right greater saphenous vein. She didn't really look  at the lesser saphenous vein this both wounds are on the medial aspect. She has reflux in the common femoral vein and popliteal vein and an accessory vein on the right and the common femoral vein and popliteal vein on the left. I'm going to have her go to see vein and vascular just the look over things and see if anything else beside aggressive compression is indicated here. We have not been able to make any progress on these wounds in spite of the fact that the surface of the wounds is never look too bad. 11/20/17; no major change in the condition of the wounds. Patient reports a large amount of drainage. She has a lot of complaints of pain although enlisting her today I wonder if some of this at least his neuropathic rather than secondary to her wounds. She has an appointment with vein and vascular on 12/30/17. The refractory nature of these wounds in my mind at least need vein and vascular to look over the wounds the recent reflux studies we did and her history to see if anything further can be done here. I also note her gait is deteriorated quite a bit. 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 She has a bilateral Trendelenburg gait. I wonder if this is neuropathic or perhaps multilevel radicular. 11/27/17; her wounds actually looks slightly better. Healthy-looking granulation tissue a scant amount of epithelialization. [place] healthcare will not pay for [employer]. They will play for tri layer Oasis and Dermagraft. This is not a diabetic ulcer. We'll try for the tri layer Oasis. She still complains of some drainage. She has a vein and vascular appointment on 12/30/17 12/04/17; the wounds visually look quite good. Healthy-looking granulation with some degree of epithelialization. We are still waiting for response to our request  for trial to try layer Oasis. Her appointment with vascular to review venous and arterial issues isn't sold the end of July 7/31. Not allow debridement or even vigorous cleansing of the wound surface. 12/18/17; slightly smaller especially on the right. 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 We've been using Hydrofera Blue. We still are looking into triple layer Oasis through her insurance 01/08/18 on evaluation today patient's wound actually appears to be showing signs of good improvement at this point in time. She has been tolerating the dressing changes without complication. Fortunately there does not appear to be any evidence of infection at this point in time. We have been utilizing silver nitrate which does seem to be of benefit for her which is also good news. Overall I'm very happy with how things seem to be both regards appearance as well as measurement. Patient did see Dr. Bridgett Larsson for evaluation on 12/30/17. In his assessment he felt that stripping would not likely add much more than chronic compression to the patient's healing process. His recommendation was to follow-up in three months with Dr. Doren Custard if she hasn't healed in order to consider referral back to you and see vascular where she previously was in a trial and was able to get her wound to heal. I'll be see what she feels she when you staying compression and he reiterated this as well. 01/13/18 on evaluation today patient appears to actually be doing very well in regard to her bilateral medial malleolus ulcers. She seems to have tolerated the chemical cauterization with silver nitrate last week she did have some pain through that evening but fortunately states that I'll be see since it seems to be doing  better she is overall pleased with the progress. 01/21/18; really quite a remarkable improvement since I've last seen these wounds. We started using silver nitrate specially on the islands of hyper  granulation which for some reason her around the wound circumference. This is really done quite nicely. Primary dressing Hydrofera Blue under 4 layer compression. She seems to be able to hold out without a nurse rewrap. Follow-up in 1 week 01/28/18; we've continued the hydrofera blue but continued with chemical cauterization to the wound area that we started about a month ago for irregular hyper granulation. She is made almost stunning improvement in the overall wound dimensions. I was not really expecting this degree of improvement in these chronic wounds 02/05/18; we continue with Hydrofera Bluebut of also continued the aggressive chemical cauterization with silver nitrate. We made nice progress with the right greater than left wound. 02/12/18. We continued with Hydrofera Blue after aggressive chemical cauterization with silver nitrate. We appear to be making nice progress with both wound areas 02/19/2018; we continue with [facility] after washing the wounds vigorously with Anasept spray and chemical cauterization with silver nitrate. We are making excellent progress. The area on the right's just about closed 02/26/2018. The area on the left medial ankle had too much necrotic debris today. I used a #5 curette we are able to get most of the soft. I continued with the silver nitrate to the much smaller wound on the right medial ankle she had a new area on her right lower pretibial area which she says was due to a role in her compression 03/05/2018; both wound areas look healthy. Not much change in dimensions from last week. I continue to use silver nitrate and Hydrofera Blue. The patient saw Dr. Doren Custard of vein and vascular. He felt she had venous stasis ulcers. He felt based on her previous arteriogram she should have adequate circulation for healing. Also she has deep venous reflux but really no significant correctable superficial venous reflux at this time. He felt we should continue with  conservative management including leg elevation and compression 04/02/2018; since we last saw this woman about a month ago she had a fall apparently suffered a pelvic fracture. I did not look up the x-ray. Nevertheless because of pain she literally was bedbound for 2 weeks and had home health coming out to change the dressing. Somewhat predictably this is resulted in considerable improvement in both wound areas. The right is just about closed on the medial malleolus and the left is about half the size. 04/16/2018; both her wounds continue to go down in size. Using Hydrofera Blue. 05/07/18; both her wounds appeared to be improving especially on the right where it is almost closed. We are using Hydrofera Blue 05/14/2018; slightly worse this week with larger wounds. Surface on the left medial not quite as good. We have been using Hydrofera Blue 05/21/18; again the wounds are slightly larger. Left medial malleolus slightly larger with eschar around the circumference. We have been using Hydrofera Blue undergoing a wraps for a prolonged period of time. This got a lot better when she was more recumbent due to a fall and a back injury. I change the primary dressing the silver alginate today. She did not tolerate a 4 layer compression previously although I may need to bring this up with her next time 05/28/2018; area on the left medial malleolus again is slightly larger with more drainage. Area on the right is roughly unchanged. She has a small area of folliculitis  on the right medial just on the lower calf. This does not look ominous. 06/03/2018 left medial malleolus slightly smaller in a better looking surface. We used silver nitrate on this last time with silver alginate. The area on the right appears slightly smaller 1/10; left medial malleolus slightly smaller. Small open area on the right. We used silver nitrate and silver alginate as of 2 weeks ago. We continue with the wound and compression. These  got a lot better when she was off her feet 1/17; right medial malleolus wound is smaller. The left may be slightly smaller. Both surfaces look somewhat better. 1/24; both wounds are slightly smaller. Using silver alginate under Unna boots 1/31; both wounds appear smaller in fact the area on the right medial is just about closed. Surface eschar. We have been using silver alginate under Unna boots. The patient is less active now spends let much less time on her feet and I think this is contributed to the general improvement in the wound condition 2/7; both wounds appear smaller. I was hopeful the right medial would be closed however there there is still the same small open area. Slight amount of surface eschar on the left the dimensions are smaller there is eschar but the wound edges appear to be free. We have been using silver alginate under Unna boot's 2/14; both wounds once again measure smaller. Circumferential eschar on the left medial. We have been using silver alginate under Unna boots with gradual improvement 2/21; the area on the right medial malleolus has healed. The area on the left is smaller. We have been using silver alginate and Unna boots. We can discharge wrapping the right leg she has 20/30 stockings at home she will need to protect the scar tissue in this area 2/28; the area on the right medial malleolus remains closed the patient has a compression stocking. The area on the left is smaller. We have been using silver alginate and Unna boots. 3/6 the area on the right medial ankle remains closed. Good edema control noted she is using her own compression stocking. The area on the left medial ankle is smaller. We have been managing this with silver alginate and Unna boots which we will continue today. 3/13; the area on the right medial ankle remains closed and I'm declaring it healed today. When necessary the left is about the same still a healthy-looking surface but no major change  and wound area. No evidence of infection and using silver alginate under unna and generally making considerable improvement 3/27 the area on the right medial ankle remains closed the area on the left is about the same as last week. Certainly not any worse we have been using silver alginate under an Unna boot 4/3; the area on the right medial ankle remains closed per the patient. We did not look at this wound. The wound on the left medial ankle is about the same surface looks healthy we have been using silver alginate under an Unna boot 4/10; area on the right medial ankle remains closed per the patient. We did not look at this wound. The wound on the left medial ankle is slightly larger. The patient complains that the [facility] caused burning pain all week. She also told us that she was a lot more active this week. Changed her back to silver alginate 4/17; right medial ankle still closed per the patient. Left medial ankle is slightly larger. Using silver alginate. She did not tolerate Hydrofera Blue on this area  4/24; right medial ankle remains closed we have not look at this. The left medial ankle continues to get larger today by about a centimeter. We have been using silver alginate under Unna boots. She complains about 4 layer compression as an alternative. She has been up on her feet working on her garden 5/8; right medial ankle remains closed we did not look at this. The left medial ankle has increased in size about 100%. We have been using silver alginate under Unna boots. She noted increased pain this week and was not surprised that the wound is deteriorated 5/15; no major change in SA however much less erythema ( one week of doxy ocellulitis). 5/22-63 year old female returns at 1 week to the clinic for left medial ankle wound for which we have been using silver alginate under 3 layer compression She was placed on DOXY at last visit - the wound is wider at this visit. She is in 3  layer compression 5/29; change to [facility] last week. I had given her empiric doxycycline 2 weeks ago for a week. She is in 3 layer compression. She complains of a lot of pain and drainage on presentation today. 6/5; using Hydrofera Blue. I gave her doxycycline recently empirically for erythema and pain around the wound. Believe her cultures showed enterococcus which not would not have been well covered by doxycycline nevertheless the wound looks better and I don't feel specifically that the enterococcus needs to be covered. She has a new what looks like a wrap injury on her lateral left ankle. 6/12; she is using Hydrofera Blue. She has a new area on the left anterior lower tibial area. This was a wrap injury last week. 6/19; the patient is using Hydrofera Blue. She arrived with marked inflammation and erythema around the wound and tenderness. 12/01/18 on evaluation today patient appears to be doing a little bit better based on what I'm hearing from the standpoint of lassos evaluation to this as far as the overall appearance of the wound is concerned. Then sometime substandard she typically sees Dr. Dellia Nims. Nonetheless overall very pleased with the progress that she's made up to this point. No fevers, chills, nausea, or vomiting noted at this time. 7/10; some improvement in the surface area. Aggressively debrided last week apparently. I went ahead with the debridement today although the patient does not tolerate this very well. We have been using Iodoflex. Still a fair amount of drainage 7/17; slightly smaller. Using Iodoflex. 7/24; no change from last week in terms of surface area. We have been using Iodoflex. Surface looks and continues to look somewhat better 7/31; surface area slightly smaller better looking surface. We have been using Iodoflex. This is under Unna boot compression 8/7-Patient presents at 1 week with Unna boot and Iodoflex, wound appears better 8/14-Patient presents at  1 week with Iodoflex, we use the Unna boot, wound appears to be stable better.Patient is getting Botox treatment for the inversion of the foot for tendon release, Next week 8/21; we are using Iodoflex. Unna boot. The wound is stable in terms of surface area. Under illumination there is some areas of the wound that appear to be either epithelialized or perhaps this is adherent slough at this point I was not really clear. It did not wipe off and I was reluctant to debride this today. 8/28; we are using Iodoflex in an Unna boot. Seems to be making good improvement. 9/4; using Iodoflex and wound is slightly smaller. 9/18; we are using Iodoflex with  topical silver nitrate when she is here. The wound continues to be smaller 10/2; patient missed her appointment last week due to GI issues. She left and Iodoflex based dressing on for 2 weeks. Wound is about the same size about the size of a dime on the left medial lower 10/9 we have been using Iodoflex on the medial left ankle wound. She has a new superficial probable wrap injury on the dorsal left ankle 10/16; we have been using Hydrofera Blue since last week. This is on the left medial ankle 10/23; we have been using Hydrofera Blue since 2 weeks ago. This is on the left medial ankle. Dimensions are better 11/6; using Hydrofera Blue. I think the wound is smaller but still not closed. Left medial ankle 11/13; we have been using Hydrofera Blue. Wound is certainly no smaller this week. Also the surface not as good. This is the remanent of a very large area on her left medial ankle. 11/20; using Sorbact since last week. Wound was about the same in terms of size although I was disappointed about the surface debris 12/11; 3-week follow-up. Patient was on vacation. Wound is measuring slightly larger we have been using Sorbact. Electronic Signature(s) Signed: 05/13/2019 6:34:01 PM By: Linton Ham MD Entered By: Linton Ham on 05/13/2019  10:43:53 -------------------------------------------------------------------------------- Physical Exam Details Patient Name: Date of Service: [name] 05/13/2019 10:00 AM Medical Record [id number] Patient Account Number: [id number] Date of Birth/Sex: Treating RN: [birth year] (63 y.o. F) Primary Care Provider: [name] Other Clinician: Referring Provider: Treating Provider/Extender:Ronniesha Seibold, Velva Harman, Essie Christine in Treatment: 335 Constitutional Sitting or standing Blood Pressure is within target range for patient.. Pulse regular and within target range for patient.Marland Kitchen Respirations regular, non-labored and within target range.. Temperature is normal and within the target range for the patient.Marland Kitchen Appears in no distress. Notes Wound exam; left medial malleolus. Surface area I think is about the same. Again under illumination a very adherent fibrinous debris. Debrided using a #3 curette although she does not tolerate this very well. Hemostasis with direct pressure Electronic Signature(s) Signed: 05/13/2019 6:34:01 PM By: Linton Ham MD Entered By: Linton Ham on 05/13/2019 10:45:32 -------------------------------------------------------------------------------- Physician Orders Details Patient Name: Date of Service: [name] 05/13/2019 10:00 AM Medical Record [id number] Patient Account Number: [id number] Date of Birth/Sex: Treating RN: 01/03/1956 (63 y.o. [name] Primary Care Provider: [name] Other Clinician: Referring Provider: Treating Provider/Extender:Advait Buice, Tawny Asal in Treatment: [phone] Verbal / Phone Orders: No Diagnosis Coding Follow-up Appointments Return Appointment in 1 week. Dressing Change Frequency Wound #3 Left,Medial Malleolus Do not change entire dressing for one week. - change dressing in one week Skin Barriers/Peri-Wound Care Moisturizing lotion TCA Cream or Ointment - mixed  with lotion Wound Cleansing Clean wound with Wound Cleanser - clean with anasept with dressing changes May shower with protection. Primary Wound Dressing Wound #3 Left,Medial Malleolus Other: - Sorbact. Apply foam to shin for protection Secondary Dressing Wound #3 Left,Medial Malleolus Dry Gauze Kerramax Edema Control Unna Boot to Left Lower Extremity - no kerlix layer Avoid standing for long periods of time Elevate legs to the level of the heart or above for 30 minutes daily and/or when sitting, a frequency of: - throughout the day Support Garment 20-30 mm/Hg pressure to: - compression stocking right leg daily Electronic Signature(s) Signed: 05/13/2019 6:34:01 PM By: Linton Ham MD Signed: 05/16/2019 5:56:42 PM By: Kela Millin Entered By: Kela Millin on 05/13/2019 10:41:25 -------------------------------------------------------------------------------- Problem List Details Patient Name: Date of  Service: YAREN, BARIENTOS 05/13/2019 10:00 AM Medical Record [id number] Patient Account Number: [id number] Date of Birth/Sex: Treating RN: 07/03/1955 (63 y.o. 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 08/03/2014 08/03/2014 and inflammation L03.116 Cellulitis of left lower limb 09/07/2014 09/07/2014 L97.321 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 F) Primary Care Provider: [name] Other Clinician: Referring Provider: Treating Provider/Extender:Tracee Mccreery, Velva Harman, Essie Christine in Treatment: 335 Subjective History of Present Illness (HPI) the remaining wound is over the left medial ankle. Similar wound over the right medial ankle healed largely with use of Apligraf. Most recently we have been using Hydrofera Blue over this wound with considerable improvement. The patient has been extensively worked up in the past for her venous insufficiency and she is not a candidate for antireflux surgery although I have none of the details available currently. 08/24/14; considerable improvement today. About 50% of this wound areas now epithelialized. The base of the wound appears to be healthier granulation.as opposed to last week when she had deteriorated a considerable improvement 08/17/14; unfortunately the wound has regressed somewhat. The areas of epithelialization from the superior aspect are not nearly as healthy as they were last week. The patient thinks her Hydrofera Blue slipped. 09/07/14; unfortunately the area has markedly regressed in the 2 weeks since I've seen this. There is an odor surrounding erythema. The healthy granulation tissue that we had at the base of the wound now is a dusky color. The nurse reports green drainage 09/14/14; the area looks somewhat better than last week.  There is less erythema and less drainage. The culture I did did not show any growth. Nevertheless I think it is better to continue the Cipro and doxycycline for a further week. The remaining wound area was debridement. 09/21/14. Wound did not require debridement last week. Still less erythema and less drainage. She can complete her antibiotics. The areas of epithelialization in the superior aspect of the wound do not look as healthy as they did some weeks ago 10/05/14 continued improvement in the condition of this wound. There is advancing epithelialization. Less aggressive debridement required 10/19/14 continued improvement in the condition and volume of this wound. Less aggressive debridement to the inferior part of this to remove surface slough and fibrinous eschar 11/02/14 no debridement is required. The surface granulation appears healthy although some of her islands of epithelialization seem to have regressed. No evidence of infection 11/16/14; lites surface debridement done of surface eschar. The wound does not look to be unhealthy. No evidence of infection. Unfortunately the patient has had podiatry issues in the right foot and for some reason has redeveloped small surface ulcerations  in the medial right ankle. Her original presentation involved wounds in this area 11/23/14 no debridement. The area on the right ankle has enlarged. The left ankle wound appears stable in terms of the surface although there is periwound inflammation. There has been regression in the amount of new skin 11/30/14 no debridement. Both wound areas appear healthy. There was no evidence of infection. The the new area on the right medial ankle has enlarged although that both the surfaces appear to be stable. 12/07/14; Debridement of the right medial ankle wound. No no debridement was done on the left. 12/14/14 no major change in and now bilateral medial ankle wounds. Both of these are very painful but the no overt evidence of  infection. She has had previous venous ablation 12/21/14; patient states that her right medial ankle wound is considerably more painful last week than usual. Her left is also somewhat painful. She could not tolerate debridement. The right medial ankle wound has fibrinous surface eschar 12/28/14 this is a patient with severe bilateral venous insufficiency ulcers. For a considerable period of time we actually had the one on the right medial ankle healed however this recently opened up again in June. The left medial ankle wound has been a refractory area with some absent flows. We had some success with Hydrofera Blue on this area and it literally closed by 50% however it is recently opened up Foley. Both of these were debridement today of surface eschar. She tolerates this poorly 01/25/15: No change in the status of this. Thick adherent escar. Very poor tolerance of any attempt at debridement. I had healed the right medial malleolus wound for a considerable amount of time and had the left one down to about 50% of the volume although this is totally regressed over the last 48 weeks. Further the right leg has reopened. she is trying to make a appointment with pain and vascular, previous ablations with Dr. Aleda Grana. I do not believe there is an arterial insufficiency issue here 02/01/15 the status of the adherent eschar bilaterally is actually improved. No debridement was done. She did not manage to get vascular studies done 02/08/15 continued debridement of the area was done today. The slough is less adherent and comes off with less pressure. There is no surrounding infection peripheral pulses are intact 02/15/15 selective debridement with a disposable curette. Again the slough is less adherent and comes off with less difficulty. No surrounding infection peripheral pulses are intact. 02/22/15 selective debridement of the right medial ankle wound. Slough comes off with less difficulty. No  obvious surrounding infection peripheral pulses are intact I did not debridement the one on the left. Both of these are stable to improved 03/01/15 selective debridement of both wound areas using a curette to. Adherent slough cup soft with less difficulty. No obvious surrounding infection. The patient tells me that 2 days ago she noted a rash above the right leg wrap. She did not have this on her lower legs when she change this over she arrives with widespread left greater than right almost folliculitis-looking rash which is extremely pruritic. I don't see anything to culture here. There is no rash on the rest of her body. She feels well systemically. 03/08/15; selective debridement of both wounds using a curette. Base of this does not look unhealthy. She had limegreen drainage coming out of the left leg wound and describes a lot of drainage. The rash on her left leg looks improved to. No cultures were done. 03/22/15; patient was not  here last week. Basal wounds does not look healthy and there is no surrounding erythema. No drainage. There is still a rash on the left leg that almost looks vasculitic however it is clearly limited to the top of where the wrap would be. 04/05/15; on the right required a surgical debridement of surface eschar and necrotic subcutaneous tissue. I did not debridement the area on the left. These continue to be large open wounds that are not changing that much. We were successful at one point in healing the area on the right, and at the same time the area on the left was roughly half the size of current measurements. I think a lot of the deterioration has to do with the prolonged time the patient is on her feet at work 04/19/15 I attempted-like surface debridement bilaterally she does not tolerate this. She tells me that she was in allergic care yesterday with extreme pain over her left lateral malleolus/ankle and was told that she has an "sprain" 05/03/15; large bilateral  venous insufficiency wounds over the medial malleolus/medial aspect of her ankles. She complains of copious amounts of drainage and his usual large amounts of pain. There is some increasing erythema around the wound on the right extending into the medial aspect of her foot to. historically she came in with these wounds the right one healed and the left one came down to roughly half its current size however the right one is reopened and the left is expanded. 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 05/10/15 large bilateral venous insufficiency wounds. There is less adherence surface left however the surface culture that I did last week grew pseudomonas therefore bilateral selective debridement score necessary. There is surrounding erythema. The patient describes severe bilateral drainage and a lot of pain in the left ankle. Apparently her podiatrist was were ready to do a cortisone shot 05/17/15; the patient complains of pain and again copious amounts of drainage. 05/24/15; we used Iodo flex last week. Patient notes considerable improvement in wound drainage. Only needed to change this once. 05/31/15; we continued Iodoflex; the base of these large wounds bilaterally is not too bad but there is probably likely a significant bioburden here. I would like to debridement just doesn't tolerate it. 06/06/14 I would like to continue the Iodoflex although she still hasn't managed to obtain supplies. She has bilateral medial malleoli or large wounds which are mostly superficial. Both of them are covered circumferentially with some nonviable fibrinous slough although she tolerates debridement very poorly. She apparently has an appointment for an ablation on the right leg by interventional radiology. 06/14/15; the patient arrives with the wounds and static condition. We attempted a debridement although she does not do well with this secondary to pain.  I 07/05/15; wounds are not much smaller however there appears to be a cleaner granulating base. The left has tight fibrinous slough greater than the right. Debridement is tolerated poorly due to pain. Iodoflex is done more for these wounds in any of the multitude of different dressings I have tried on the left 1 and then subsequently the right. 07/12/15; no change in the condition of this wound. I am able to do an aggressive debridement on the right but not the left. She simply cannot tolerate it. We have been using Iodoflex which helps somewhat. It is worthwhile remembering that at one point we healed the right medial ankle wound and the left  was about 25% of the current circumference. We have suggested returning to vascular surgery for review of possible further ablations for one reason or another she has not been able to do this. 07/26/15 no major change in the condition of either wound on her medial ankle. I did not attempt to debridement of these. She has been aggressively scrubbing these while she is in the shower at home. She has her supply of Iodoflex which seems to have done more for these wounds then anything I have put on recently. 08/09/15 wound area appears larger although not verified by measurements. Using Iodoflex 09/05/2015 -- she was here for avisit today but had significant problems with the wound and I was asked to see her for a physician opinion. I have summarize that this lady has had surgery on her left lower extremity about 10 years ago where the possible veins stripping was done. She has had an opinion from interventional radiology around November 2016 where no further sclerotherapy was ordered. The patient works 12 hours a day and stands on a concrete floor with work boots and is unable to get the proper compression she requires and cannot elevate her limbs appropriately at any given time. She has recently grown Pseudomonas from her wound culture but has not started her  ciprofloxacin which was called in for her. 09/13/15 this continues to be a difficult situation for this patient. At one point I had this wound down to a 1.5 x 1.5" wound on her left leg. This is deteriorated and the right leg has reopened. She now has substantial wounds on her medial calcaneus, malleoli and into her lower leg. One on the left has surface eschar but these are far too painful for me to debridement here. She has a vascular surgery appointment next week to see if anything can be done to help here. I think she has had previous ablations several years ago at [facility]. She has no major edema. She tells me that she did not get product last time [facility] and went for several days without it. She continues to work in work boots 12 hours a day. She cannot get compression/4-layer under her work boots. 09/20/15 no major change. Periwound edema control was not very good. Her point with pain and vascular is next Wednesday the 25th 09/28/15; the patient is seen vascular surgery and is apparently scheduled for repeat duplex ultrasounds of her bilateral lower legs next week. 10/05/15; the patient was seen by Dr. Doren Custard of vascular surgery. He feels that she should have arterial insufficiency excluded as cause/contributed to her nonhealing stage she is therefore booked for an arteriogram. She has apparently monophasic signals in the dorsalis pedis pulses. She also of course has known severe chronic venous insufficiency with previous procedures as noted previously. I had another long discussion with the patient today about her continuing to work 12 hour shifts. I've written her out for 2 months area had concerns about this as her work location is currently undergoing significant turmoil and this may lead to her termination. She is aware of this however I agree with her that she simply cannot continue to stand for 12 hours multiple days a week with the substantial wound areas she has. 10/19/15; the  Dr. Doren Custard appointment was largely for an arteriogram which was normal. She does not have an arterial issue. He didn't make a comment about her chronic venous insufficiency for which she has had previous ablations. Presumably it was not felt that anything additional  could be done. The patient is now out of work as I prescribed 2 weeks ago. Her wounds look somewhat less aggravated presumably because of this. I felt I would give debridement another try today 10/25/15; no major change in this patient's wounds. We are struggling to get her product that she can afford into her own home through her insurance. 11/01/15; no major change in the patient's wounds. I have been using silver alginate as the most affordable product. I spoke to Dr. Marla Roe last week with her requested take her to the OR for surgical debridement and placement of ACEL. Dr. Marla Roe told me that she would be willing to do this however [facility] will not cover this, fortunately the patient has [place] healthcare of some variant 11/08/15; no major change in the patient's wounds. She has been completely nonviable surface that this but is in too much pain with any attempted debridement are clinic. I have arranged for her to see Dr. Marla Roe ham of plastic surgery and this appointment is on Monday. I am hopeful that they will take her to the OR for debridement, possible ACEL ultimately possible skin graft 11/22/15 no major change in the patient's wounds over her bilateral medial calcaneus medial malleolus into the lower legs. Surface on these does not look too bad however on the left there is surrounding erythema and tenderness. This may be cellulitis or could him sleepy tinea. 11/29/15; no major changes in the patient's wounds over her bilateral medial malleolus. There is no infection here and I don't think any additional antibiotics are necessary. There is now plan to move forward. She sees Dr. Marla Roe in a week's time  for preparation for operative debridement and ACEL placement I believe on 7/12. She then has a follow-up appointment with Dr. Marla Roe on 7/21 12/28/15; the patient returns today having been taken to the [facility] by Dr. Marla Roe 12/12/15 she underwent debridement, intraoperative cultures [which were negative]. She had placement of a wound VAC. Parent really ACEL was not available to be placed. The wound VAC foam apparently adhered to the wound since then she's been using silver alginate, Xeroform under Ace wraps. She still says there is a lot of drainage and a lot of pain 01/31/16; this is a patient I see monthly. I had referred her to Dr. Marla Roe him of plastic surgery for large wounds on her bilateral medial ankles. She has been to the OR twice once in early July and once in early August. She tells me over the last 3 weeks she has been using the wound VAC with ACEL underneath it. On the right we've simply been using silver alginate. Under Kerlix Coban wraps. 02/28/16; this is a patient I'm currently seeing monthly. She is gone on to have a skin graft over her large venous insufficiency ulcer on the left medial ankle. This was done by Dr. Marla Roe him. The patient is a bit perturbed about why she didn't have one on her right medial ankle wound. She has been using silver alginate to this. 03/06/16; I received a phone call from her plastic surgery Dr. Marla Roe. She expressed some concern about the viability of the skin graft she did on the left medial ankle wound. Asked me to place Endoform on this. She told me she is not planning to do a subsequent skin graft on the right as the left one did not take very well. I had placed Hydrofera Blue on the right 03/13/16; continue to have a reasonably healthy wound on  the right medial ankle. Down to 3 mm in terms of size. There is epithelialization here. The area on the left medial ankle is her skin graft site. I suppose the last week this looks somewhat  better. She has an open area inferiorly however in the center there appears to be some viable tissue. There is a lot of surface callus and eschar that will eventually need to come off however none of this looked to be infected. Patient states that the is able to keep the dressing on for several days which is an improvement. 03/20/16 no major change in the circumference of either wound however on the left side the patient was at Dr. Eusebio Friendly office and they did a debridement of left wound. 50% of the wound seems to be epithelialized. I been using Endoform on the left Hydrofera Blue in the right 03/27/16; she arrives today with her wound is not looking as healthy as they did last week. The area on the right clearly has an adherent surface to this a very similar surface on the left. Unfortunately for this patient this is all too familiar problem. Clearly the Endoform is not working and will need to change that today that has some potential to help this surface. She does not tolerate debridement in this clinic very well. She is changing the dressing wants 04/03/16; patient arrives with the wounds looking somewhat better especially on the right. Dr. Migdalia Dk change the dressing to silver alginate when she saw her on Monday and also sold her some compression socks. The usefulness of the latter is really not clear and woman with severely draining wounds. 04/10/16; the patient is doing a bit of an experiment wearing the compression stockings that Dr. Migdalia Dk provided her to her left leg and the out of legs based dressings that we provided to the right. 05/01/16; the patient is continuing to wear compression stockings Dr. Migdalia Dk provided her on the left that are apparently silver impregnated. She has been using Iodoflex to the right leg wound. Still a moderate amount of drainage, when she leaves here the wraps only last for 4 days. She has to change the stocking on the left leg every night 05/15/16; she is  now using compression stockings bilaterally provided by Dr. Marla Roe. She is wearing a nonadherent layer over the wounds so really I don't think there is anything specific being done to this now. She has some reduction on the left wound. The right is stable. I think all healing here is being done without a specific dressing 06/09/16; patient arrives here today with not much change in the wound certainly in diameter to large circular wounds over the medial aspect of her ankle bilaterally. Under the light of these services are certainly not viable for healing. There is no evidence of surrounding infection. She is wearing compression stockings with some sort of silver impregnation as prescribed by Dr. Marla Roe. She has a follow-up with her tomorrow. 06/30/16; no major change in the size or condition of her wounds. These are still probably covered with a nonviable surface. She is using only her purchase stockings. She did see Dr. Marla Roe who seemed to want to apply Dakin's solution to this I'm not extreme short what value this would be. I would suggest Iodoflex which she still has at home. 07/28/16; I follow [name] episodically along with Dr. Marla Roe. 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 She has been applying a topical collagen ointment  to both wounds with Adaptic. I don't think Dr. Marla Roe is planning to take her back to the OR. 08/19/16; I follow [name] on a monthly basis along with Dr. Marla Roe of plastic surgery. 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 I been following her for a number of years. At one point I was able to get the right medial malleolus wound to heal and had the left medial malleolus down to about half its current size however and I had to send her to plastic surgery for an operative debridement. Since then things have been  stable to slightly improve the area on the right is slightly better one in the left about the same although there is much less adherent surface than I'm used to with this patient. She is using some form of liquid collagen gel that Dr. Marla Roe provided a Kerlix cover with the patient's own pressure stockings. She tells me that she has extreme pain in both ankles and along the lateral aspect of both feet. She has been unable to work for some period of time. She is telling me she is retiring at the beginning of April. She sees Dr. Doran Durand of orthopedics next week 09/22/16; patient has not seen Dr. Marla Roe since the last time she is here. I'm not really sure what she is using to the wounds other than bits and pieces of think she had left over including most recently Hydrofera Blue. She is using juxtalite stockings. She is having difficulty with her husband's recent illness "stroke". She is having to transport him to various doctors appointments. Dr. Marla Roe left her the option of a repeat debridement with ACEL however she has not been able to get the time to follow-up on this. She continues to have a fair amount of drainage out of these wounds with certainly precludes leaving dressings on all week 10/13/16; patient has not seen Dr. Marla Roe since she was last in our clinic. I'm not really sure what she is doing with the wounds, we did try to get her [facility] and I think she is actually using this most of the time. Because of drainage she states she has to change this every second day although this is an improvement from what she used to do. She went to see Dr. Doran Durand who did not think she had a muscular issue with regards to her feet, he referred her to a neurologist and I think the appointment is sometime in June. I changed her back to Iodoflex which she has used in the past but not recently. 11/03/16; the patient has been using Iodoflex although she ran out of this. Still claims that  there is a lot of drainage although the wound does not look like this. No surrounding erythema. She has not been back to see Dr. Marla Roe 11/24/16; the patient has been using Iodoflex again but she ran out of it 2 or 3 days ago. There is no major change in the condition of either one of these wounds in fact they are larger and covered in a thick adherent surface slough/nonviable tissue especially on the left. She does not tolerate mechanical debridement in our clinic. Going back to see Dr. Marla Roe of plastic surgery for an operative debridement would seem reasonable. 12/15/16; the patient has not been back to see Dr. Marla Roe. She is been dealing with a series of illnesses and her husband which of monopolized her time. She is been using Sorbact which  we largely supplied. She states the drainage is bad enough that it maximum she can go 2-3 days without changing the dressing 01/12/2017 -- the patient has not been back for about 4 weeks and has not seen Dr. Marla Roe not does she have any appointment pending. 01/23/17; patient has not seen Dr. Marla Roe even though I suggested this previously. She is using Santyl that was suggested last week by Dr. Con Memos this Cost her $16 through her insurance which is indeed surprising 02/12/17; continuing Santyl and the patient is changing this daily. A lot of drainage. She has not been back to see plastic surgery she is using an Ace wrap. Our intake nurse suggested wrap around stockings which would make a good reasonable alternative 02/26/17; patient is been using Santyl and changing this daily due to drainage. She has not been to see plastic surgery she uses in April Ace wrap to control the edema. She did obtain extremitease stockings but stated that the edema in her leg was to big for these 03/20/17; patient is using Santyl and Anasept. Surfaces looked better today the area on the right is actually measuring a little smaller. She has states she has a lot  of pain in her feet and ankles and is asking for a consult to pain control which I'll try to help her with through our case manager. 04/10/17; the patient arrives with better-looking wound surfaces and is slightly smaller wound on the left she is using a combination of Santyl and Anasept. She has an appointment or at least as started in the pain control center associated with [facility] 05/14/17; this is a patient who I followed for a prolonged period of time. 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 At one point I had this down to a much smaller wound on the left however these reopened and we've never been able to get these to heal. She has been using Santyl and Anasept gel although 2 weeks ago she ran out of the Anasept gel. She has a stable appearance of the wound. She is going to the wound care clinic at [facility]. They wanted do a nerve block/spinal block although she tells me she is reluctant to go forward with that. 05/21/17; this is a patient I have followed for many years. 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 Chronic pain and deformity in her ankles as well. She is been to see plastic surgery as well as orthopedics. Using PolyMem AG most recently/Kerramax/ABDs and 2 layer compression. She has managed to keep this on and she is coming in for a nurse check to change the dressing on Tuesdays, we see her on Fridays 06/05/17; really quite a good looking surface and the area especially on the right medial has contracted in terms of dimensions. Well granulated healthy-looking tissue on both sides. Even with an open curet there is nothing that even feels abnormal here. This is as good as I've seen this in quite some time. We have been using PolyMem AG and bringing her in for a nurse check 06/12/17; really quite good surface on both of these wounds. The right medial has contracted a bit left is not. We've been using PolyMem and  AG and she is coming in for a nurse visit 06/19/17; we have been using PolyMem AG and bringing her in for a nurse check. Dimensions of her wounds are not better but the surfaces looked better bilaterally. She complained of bleeding last night and the  left wound and increasing pain bilaterally. She states her wound pain is more neuropathic than just the wounds. There was some suggestion that this was radicular from her pain management doctor in talking to her it is really difficult to sort this out. 06/26/17; using PolyMem and AG and bringing her in for a nurse check as All of this and reasonably stable condition. Certainly not improved. The dimensions on the lateral part of the right leg look better but not really measuring better. The medial aspect on the left is about the same. 07/03/16; we have been using PolyMen AG and bringing her in for a nurse check to change the dressings as the wounds have drainage which precludes once weekly changing. We are using all secondary absorptive dressings.our intake nurse is brought up the idea of using a wound VAC/snap VAC on the wound to help with the drainage to see if this would result in some contraction. This is not a bad idea. The area on the right medial is actually looking smaller. Both wounds have a reasonable-looking surface. There is no evidence of cellulitis. The edema is well controlled 07/10/17; the patient was denied for a snap VAC by her insurance. The major issue with these wounds continues to be drainage. We are using wicked PolyMem AG and she is coming in for a nurse visit to change this. The wounds are stable to slightly improved. The surface looks vibrant and the area on the right certainly has shrunk in size but very slowly 07/17/17; the patient still has large wounds on her bilateral medial malleoli. Surface of both of these wounds looks better. The dimensions seem to come and go but no consistent improvement. There is no epithelialization. We  do not have options for advanced treatment products due to insurance issues. They did not approve of the wound VAC to help control the drainage. More recently we've been using PolyMem and AG wicked to allow drainage through. We have been bringing her in for a nurse visit to change this. We do not have a lot of options for wound care products and the home again due to insurance issues 07/24/17; the patient's wound actually looks somewhat better today. No drainage measurements are smaller still healthy-looking surface. We used silver collagen under PolyMen started last week. We have been bringing her in for a dressing change 07/31/17; patient's wound surface continued to look better and I think there is visible change in the dimensions of the wound on the right. Rims of epithelialization. We have been using silver collagen under PolyMen and bringing her in for a dressing change. There appears to be less drainage although she is still in need of the dressing change 08/07/17. Patient's wound surface continues to look better on both sides and the area on the right is definitely smaller. We have been using silver collagen and PolyMen. She feels that the drainage has been it has been better. I asked her about her vascular status. She went to see Dr. Aleda Grana at [facility] and had some form of ablation. I don't have much detail on this. I haven't my notes from 2016 that she was not a candidate for any further ablation but I don't have any more information on this. We had referred her to vein and vascular I don't think she ever went. He does not have a history of PAD although I don't have any information on this either. We don't even have ABIs in our record 08/14/17; we've been using silver collagen and  PolyMen cover. And putting the patient and compression. She we are bringing her in as a nurse visit to change this because ofarge amount of drainage. We didn't the ABIs in clinic today since they had been  done in many moons 1.2 bilaterally. She has been to see vein and vascular however this was at [facility] and she had ablation although I really don't have any information on this all seemed biking get a report. She is also been operatively debrided by plastic surgery and had a cell placed probably 8-12 months ago. This didn't have a major effect. We've been making some gains with current dressings 08/19/17-She is here in follow-up evaluation for bilateral medial malleoli ulcers. She continues to tolerate debridement very poorly. We will continue with recently changed topical treatment; if no significant improvement may consider switching to Iodosorb/Iodoflex. 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 She has been using silver collagen and PolyMem. I believe she has been used and tried on Iodoflex before. 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 These have been very refractory over time. We have been using silver collagen and PolyMen. She is coming in once a week for a doctor's and once a week for nurse visits. We are actually making some progress 09/18/17; the patient's wounds are smaller especially on the right medial. She arrives today to upset to consider even washing these off with Anasept which I think is been part of the reason this is been closing. We've been using collagen covered in PolyMen otherwise. It is noted that she has a small area of folliculitis on the right medial calf that. As we are wrapping her legs I'll give her a short course of doxycycline to make sure this doesn't amount to anything. She is a long list of complaints today including imbalance, shortness of breath on exertion, inversion of her left ankle. With regards to the latter  complaints she is been to see orthopedics and they offered her a tendon release surgery I believe but wanted her wounds to be closed first. I have recommended she go see her primary physician with regards to everything else. 09/25/17; patient's wounds are about the same size. We have made some progress bilaterally although not in recent weeks. She will not allow me T wash these wounds with Anasept even if she is doing her cell. Wheeze we've been using collagen covered in PolyMen. Last week she had a small area of folliculitis this is now opened into a small wound. She completed 5 days of trimethoprim sulfamethoxazole 10/02/17; unfortunately the area on her left medial ankle is worse with a larger wound area towards the Achilles. The patient complains of a lot of pain. She will not allow debridement although visually I don't think there is anything to debridement in any case. We have been using silver collagen and PolyMen for several months now. Initially we are making some progress although I'm not really seeing that today. We will move back to [facility]. His admittedly this is a bit of a repeat however I'm hoping that his situation is different now. The patient tells me she had her leg on the left give out on her yesterday this is process some pain. 10/09/17; the patient is seen twice a week largely because of drainage issues  coming out of the chronic medial bimalleolar wounds that are chronic. Last week the dimensions of the one on the left looks a little larger I changed her to [name]. She comes in today with a history of terrible pain in the bilateral wound areas. She will not allow debridement. She will not even allow a tissue culture. There is no surrounding erythema no no evidence of cellulitis. We have been putting her Kerlix Coban man. She will not allow more aggressive compression as there was a suggestion to put her in 3 layer wraps. 10/16/17; large wounds on her bilateral  medial malleoli. These are chronic. Not much change from last week. The surface looks have healthy but absolutely no epithelialization. A lot of pain little less so of drainage. She will not allow debridement or even washing these off in the vigorous fashion with Anasept. 10/23/17; large wounds on her bilateral malleoli which are chronic. Some improvement in terms of size perhaps on the right since last time I saw these. She states that after we increased the 3 layer compression there was some bleeding, when she came in for a nurse visit she did not want 3 layer compression put back on about our nurse managed to convince her. She has known chronic venous visit issues and I'm hoping to get her to tolerate the 3 layer compression. using Hydrofera Blue 10/30/17; absolutely no change in the condition of either wound although we've had some improvement in dimensions on the right.. Attempted to put her in 3 layer compression she didn't tolerated she is back in 2 layer compression. We've been using Hydrofera Blue We looked over her past records. She had venous reflux studies in November 2016. There was no evidence of deep venous reflux on the right. Superficial vein did not show the greater saphenous vein at think this is been previously ablated the small saphenous vein was within normal limits. The left deep venous system showed no DVT the vessels were positive for deep venous reflux in the posterior tibial veins at the ankle. The greater saphenous vein was surgically absent small saphenous vein was within normal limits. She went to vein and vascular at [facility]. I believe she had an ablation on the left greater saphenous vein. I'll update her reflux studies perhaps ever reviewed by vein and vascular. We've made absolutely no progress in these wounds. Will also try to read and TheraSkins through her insurance 11/06/17; W the patient apparently has a 2 week follow-up with vein and vascular I like him to  review the whole issue with regards to her previous vascular workup by Dr. Aleda Grana. We've really made no progress on these wounds in many months. She arrives today with less viable looking surface on the left medial ankle wound. This was apparently looking about the same on Tuesday when she was here for nurse visit. 11/13/17; deep tissue culture I did last time of the left lower leg showed multiple organisms without any predominating. In particular no Staphylococcus or group A strep were isolated. We sent her for venous reflux studies. She's had a previous left greater saphenous vein stripping and I think sclerotherapy of the right greater saphenous vein. She didn't really look at the lesser saphenous vein this both wounds are on the medial aspect. She has reflux in the common femoral vein and popliteal vein and an accessory vein on the right and the common femoral vein and popliteal vein on the left. I'm going to have her go to see vein  and vascular just the look over things and see if anything else beside aggressive compression is indicated here. We have not been able to make any progress on these wounds in spite of the fact that the surface of the wounds is never look too bad. 11/20/17; no major change in the condition of the wounds. Patient reports a large amount of drainage. She has a lot of complaints of pain although enlisting her today I wonder if some of this at least his neuropathic rather than secondary to her wounds. She has an appointment with vein and vascular on 12/30/17. The refractory nature of these wounds in my mind at least need vein and vascular to look over the wounds the recent reflux studies we did and her history to see if anything further can be done here. I also note her gait is deteriorated quite a bit. 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 She has a bilateral Trendelenburg gait. I wonder if this is neuropathic or perhaps multilevel  radicular. 11/27/17; her wounds actually looks slightly better. Healthy-looking granulation tissue a scant amount of epithelialization. [place] [facility] will not pay for [employer]. They will play for tri layer Oasis and Dermagraft. This is not a diabetic ulcer. We'll try for the tri layer Oasis. She still complains of some drainage. She has a vein and vascular appointment on 12/30/17 12/04/17; the wounds visually look quite good. Healthy-looking granulation with some degree of epithelialization. We are still waiting for response to our request for trial to try layer Oasis. Her appointment with vascular to review venous and arterial issues isn't sold the end of July 7/31. Not allow debridement or even vigorous cleansing of the wound surface. 12/18/17; slightly smaller especially on the right. 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 We've been using Hydrofera Blue. We still are looking into triple layer Oasis through her insurance 01/08/18 on evaluation today patient's wound actually appears to be showing signs of good improvement at this point in time. She has been tolerating the dressing changes without complication. Fortunately there does not appear to be any evidence of infection at this point in time. We have been utilizing silver nitrate which does seem to be of benefit for her which is also good news. Overall I'm very happy with how things seem to be both regards appearance as well as measurement. Patient did see Dr. Bridgett Larsson for evaluation on 12/30/17. In his assessment he felt that stripping would not likely add much more than chronic compression to the patient's healing process. His recommendation was to follow-up in three months with Dr. Doren Custard if she hasn't healed in order to consider referral back to you and see vascular where she previously was in a trial and was able to get her wound to heal. I'll be see what she feels she when you staying compression and he  reiterated this as well. 01/13/18 on evaluation today patient appears to actually be doing very well in regard to her bilateral medial malleolus ulcers. She seems to have tolerated the chemical cauterization with silver nitrate last week she did have some pain through that evening but fortunately states that I'll be see since it seems to be doing better she is overall pleased with the progress. 01/21/18; really quite a remarkable improvement since I've last seen these wounds. We started using silver nitrate specially on the islands of hyper granulation which for some reason her around the wound circumference. This is really done quite nicely. Primary dressing Hydrofera  Blue under 4 layer compression. She seems to be able to hold out without a nurse rewrap. Follow-up in 1 week 01/28/18; we've continued the hydrofera blue but continued with chemical cauterization to the wound area that we started about a month ago for irregular hyper granulation. She is made almost stunning improvement in the overall wound dimensions. I was not really expecting this degree of improvement in these chronic wounds 02/05/18; we continue with Hydrofera Bluebut of also continued the aggressive chemical cauterization with silver nitrate. We made nice progress with the right greater than left wound. 02/12/18. We continued with Hydrofera Blue after aggressive chemical cauterization with silver nitrate. We appear to be making nice progress with both wound areas 02/19/2018; we continue with [facility] after washing the wounds vigorously with Anasept spray and chemical cauterization with silver nitrate. We are making excellent progress. The area on the right's just about closed 02/26/2018. The area on the left medial ankle had too much necrotic debris today. I used a #5 curette we are able to get most of the soft. I continued with the silver nitrate to the much smaller wound on the right medial ankle she had a new area on her  right lower pretibial area which she says was due to a role in her compression 03/05/2018; both wound areas look healthy. Not much change in dimensions from last week. I continue to use silver nitrate and Hydrofera Blue. The patient saw Dr. Doren Custard of vein and vascular. He felt she had venous stasis ulcers. He felt based on her previous arteriogram she should have adequate circulation for healing. Also she has deep venous reflux but really no significant correctable superficial venous reflux at this time. He felt we should continue with conservative management including leg elevation and compression 04/02/2018; since we last saw this woman about a month ago she had a fall apparently suffered a pelvic fracture. I did not look up the x-ray. Nevertheless because of pain she literally was bedbound for 2 weeks and had home health coming out to change the dressing. Somewhat predictably this is resulted in considerable improvement in both wound areas. The right is just about closed on the medial malleolus and the left is about half the size. 04/16/2018; both her wounds continue to go down in size. Using Hydrofera Blue. 05/07/18; both her wounds appeared to be improving especially on the right where it is almost closed. We are using Hydrofera Blue 05/14/2018; slightly worse this week with larger wounds. Surface on the left medial not quite as good. We have been using Hydrofera Blue 05/21/18; again the wounds are slightly larger. Left medial malleolus slightly larger with eschar around the circumference. We have been using Hydrofera Blue undergoing a wraps for a prolonged period of time. This got a lot better when she was more recumbent due to a fall and a back injury. I change the primary dressing the silver alginate today. She did not tolerate a 4 layer compression previously although I may need to bring this up with her next time 05/28/2018; area on the left medial malleolus again is slightly larger with  more drainage. Area on the right is roughly unchanged. She has a small area of folliculitis on the right medial just on the lower calf. This does not look ominous. 06/03/2018 left medial malleolus slightly smaller in a better looking surface. We used silver nitrate on this last time with silver alginate. The area on the right appears slightly smaller 1/10; left medial malleolus slightly smaller.  Small open area on the right. We used silver nitrate and silver alginate as of 2 weeks ago. We continue with the wound and compression. These got a lot better when she was off her feet 1/17; right medial malleolus wound is smaller. The left may be slightly smaller. Both surfaces look somewhat better. 1/24; both wounds are slightly smaller. Using silver alginate under Unna boots 1/31; both wounds appear smaller in fact the area on the right medial is just about closed. Surface eschar. We have been using silver alginate under Unna boots. The patient is less active now spends let much less time on her feet and I think this is contributed to the general improvement in the wound condition 2/7; both wounds appear smaller. I was hopeful the right medial would be closed however there there is still the same small open area. Slight amount of surface eschar on the left the dimensions are smaller there is eschar but the wound edges appear to be free. We have been using silver alginate under Unna boot's 2/14; both wounds once again measure smaller. Circumferential eschar on the left medial. We have been using silver alginate under Unna boots with gradual improvement 2/21; the area on the right medial malleolus has healed. The area on the left is smaller. We have been using silver alginate and Unna boots. We can discharge wrapping the right leg she has 20/30 stockings at home she will need to protect the scar tissue in this area 2/28; the area on the right medial malleolus remains closed the patient has a compression  stocking. The area on the left is smaller. We have been using silver alginate and Unna boots. 3/6 the area on the right medial ankle remains closed. Good edema control noted she is using her own compression stocking. The area on the left medial ankle is smaller. We have been managing this with silver alginate and Unna boots which we will continue today. 3/13; the area on the right medial ankle remains closed and I'm declaring it healed today. When necessary the left is about the same still a healthy-looking surface but no major change and wound area. No evidence of infection and using silver alginate under unna and generally making considerable improvement 3/27 the area on the right medial ankle remains closed the area on the left is about the same as last week. Certainly not any worse we have been using silver alginate under an Unna boot 4/3; the area on the right medial ankle remains closed per the patient. We did not look at this wound. The wound on the left medial ankle is about the same surface looks healthy we have been using silver alginate under an Unna boot 4/10; area on the right medial ankle remains closed per the patient. We did not look at this wound. The wound on the left medial ankle is slightly larger. The patient complains that the [facility] caused burning pain all week. She also told us that she was a lot more active this week. Changed her back to silver alginate 4/17; right medial ankle still closed per the patient. Left medial ankle is slightly larger. Using silver alginate. She did not tolerate Hydrofera Blue on this area 4/24; right medial ankle remains closed we have not look at this. The left medial ankle continues to get larger today by about a centimeter. We have been using silver alginate under Unna boots. She complains about 4 layer compression as an alternative. She has been up on her feet working  on her garden 5/8; right medial ankle remains closed we did not  look at this. The left medial ankle has increased in size about 100%. We have been using silver alginate under Unna boots. She noted increased pain this week and was not surprised that the wound is deteriorated 5/15; no major change in SA however much less erythema ( one week of doxy ocellulitis). 5/22-63 year old female returns at 1 week to the clinic for left medial ankle wound for which we have been using silver alginate under 3 layer compression She was placed on DOXY at last visit - the wound is wider at this visit. She is in 3 layer compression 5/29; change to [facility] last week. I had given her empiric doxycycline 2 weeks ago for a week. She is in 3 layer compression. She complains of a lot of pain and drainage on presentation today. 6/5; using Hydrofera Blue. I gave her doxycycline recently empirically for erythema and pain around the wound. Believe her cultures showed enterococcus which not would not have been well covered by doxycycline nevertheless the wound looks better and I don't feel specifically that the enterococcus needs to be covered. She has a new what looks like a wrap injury on her lateral left ankle. 6/12; she is using Hydrofera Blue. She has a new area on the left anterior lower tibial area. This was a wrap injury last week. 6/19; the patient is using Hydrofera Blue. She arrived with marked inflammation and erythema around the wound and tenderness. 12/01/18 on evaluation today patient appears to be doing a little bit better based on what I'm hearing from the standpoint of lassos evaluation to this as far as the overall appearance of the wound is concerned. Then sometime substandard she typically sees Dr. Dellia Nims. Nonetheless overall very pleased with the progress that she's made up to this point. No fevers, chills, nausea, or vomiting noted at this time. 7/10; some improvement in the surface area. Aggressively debrided last week apparently. I went ahead with  the debridement today although the patient does not tolerate this very well. We have been using Iodoflex. Still a fair amount of drainage 7/17; slightly smaller. Using Iodoflex. 7/24; no change from last week in terms of surface area. We have been using Iodoflex. Surface looks and continues to look somewhat better 7/31; surface area slightly smaller better looking surface. We have been using Iodoflex. This is under Unna boot compression 8/7-Patient presents at 1 week with Unna boot and Iodoflex, wound appears better 8/14-Patient presents at 1 week with Iodoflex, we use the Unna boot, wound appears to be stable better.Patient is getting Botox treatment for the inversion of the foot for tendon release, Next week 8/21; we are using Iodoflex. Unna boot. The wound is stable in terms of surface area. Under illumination there is some areas of the wound that appear to be either epithelialized or perhaps this is adherent slough at this point I was not really clear. It did not wipe off and I was reluctant to debride this today. 8/28; we are using Iodoflex in an Unna boot. Seems to be making good improvement. 9/4; using Iodoflex and wound is slightly smaller. 9/18; we are using Iodoflex with topical silver nitrate when she is here. The wound continues to be smaller 10/2; patient missed her appointment last week due to GI issues. She left and Iodoflex based dressing on for 2 weeks. Wound is about the same size about the size of a dime on the left medial lower  10/9 we have been using Iodoflex on the medial left ankle wound. She has a new superficial probable wrap injury on the dorsal left ankle 10/16; we have been using Hydrofera Blue since last week. This is on the left medial ankle 10/23; we have been using Hydrofera Blue since 2 weeks ago. This is on the left medial ankle. Dimensions are better 11/6; using Hydrofera Blue. I think the wound is smaller but still not closed. Left medial ankle 11/13; we  have been using Hydrofera Blue. Wound is certainly no smaller this week. Also the surface not as good. This is the remanent of a very large area on her left medial ankle. 11/20; using Sorbact since last week. Wound was about the same in terms of size although I was disappointed about the surface debris 12/11; 3-week follow-up. Patient was on vacation. Wound is measuring slightly larger we have been using Sorbact. Objective Constitutional Sitting or standing Blood Pressure is within target range for patient.. Pulse regular and within target range for patient.Marland Kitchen Respirations regular, non-labored and within target range.. Temperature is normal and within the target range for the patient.Marland Kitchen Appears in no distress. Vitals Time Taken: 10:00 AM, Height: 68 in, Weight: 127 lbs, BMI: 19.3, Temperature: 97.9 F, Pulse: 75 bpm, Respiratory Rate: 18 breaths/min, Blood Pressure: 99/51 mmHg. General Notes: Wound exam; left medial malleolus. Surface area I think is about the same. Again under illumination a very adherent fibrinous debris. Debrided using a #3 curette although she does not tolerate this very well. Hemostasis with direct pressure Integumentary (Hair, Skin) Wound #3 status is Open. Original cause of wound was Gradually Appeared. The wound is located on the Left,Medial Malleolus. The wound measures 1.9cm length x 1.5cm width x 0.1cm depth; 2.238cm^2 area and 0.224cm^3 volume. There is Fat Layer (Subcutaneous Tissue) Exposed exposed. There is no tunneling or undermining noted. There is a medium amount of serosanguineous drainage noted. The wound margin is distinct with the outline attached to the wound base. There is medium (34-66%) red, pink granulation within the wound bed. There is a medium (34-66%) amount of necrotic tissue within the wound bed including Adherent Slough. 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 There was a Excisional Skin/Subcutaneous Tissue Debridement with a total area of 2.85 sq cm performed by [name]., MD. With the following instrument(s): Curette to remove Viable and Non-Viable tissue/material. Material removed includes Subcutaneous Tissue and Slough and after achieving pain control using Other (benzocaine, 20%). No specimens were taken. A time out was conducted at 10:37, prior to the start of the procedure. A Minimum amount of bleeding was controlled with Pressure. The procedure was tolerated well with a pain level of 2 throughout and a pain level of 0 following the procedure. Post Debridement Measurements: 1.9cm length x 1.5cm width x 0.1cm depth; 0.224cm^3 volume. Character of Wound/Ulcer Post Debridement is improved. Severity of Tissue Post Debridement is: Fat layer exposed. Post procedure Diagnosis Wound #3: Same as Pre-Procedure Pre-procedure diagnosis of Wound #3 is a Venous Leg Ulcer located on the Left,Medial Malleolus . There was a Haematologist Compression Therapy Procedure by Deon Pilling, RN. Post procedure Diagnosis Wound #3: Same as Pre-Procedure Plan Follow-up Appointments: Return Appointment in 1 week.  Dressing Change Frequency: Wound #3 Left,Medial Malleolus: Do not change entire dressing for one week. - change dressing in one week Skin Barriers/Peri-Wound Care: Moisturizing lotion TCA Cream or Ointment - mixed with lotion Wound Cleansing: Clean wound with Wound Cleanser - clean with anasept with dressing changes May shower with protection. Primary Wound Dressing: Wound #3 Left,Medial Malleolus: Other: - Sorbact. Apply foam to shin for protection Secondary Dressing: Wound #3 Left,Medial Malleolus: Dry  Gauze Kerramax Edema Control: Unna Boot to Left Lower Extremity - no kerlix layer Avoid standing for long periods of time Elevate legs to the level of the heart or above for 30 minutes daily and/or when sitting, a frequency of: - throughout the day Support Garment 20-30 mm/Hg pressure to: - compression stocking right leg daily 1. I am continue with the Sorbact. The wound is about the size of a dime. 2. Still under compression Electronic Signature(s) Signed: 05/13/2019 6:34:01 PM By: Linton Ham MD Entered By: Linton Ham on 05/13/2019 10:47:55 -------------------------------------------------------------------------------- SuperBill Details Patient Name: Date of Service: [name] 05/13/2019 Medical Record [id number] Patient Account Number: [id number] Date of Birth/Sex: Treating RN: 15-Feb-1956 (63 y.o. [name] Primary Care Provider: [name] Other Clinician: Referring Provider: Treating Provider/Extender:Alleen Kehm, Tawny Asal in Treatment: 335 Diagnosis Coding ICD-10 Codes Code Description [phone] Non-pressure chronic ulcer of left ankle with fat layer exposed 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 and inflammation Facility Procedures CPT4 Code Description: [id number] - DEB SUBQ TISSUE 20 SQ CM/< ICD-10 Diagnosis Description [id number] Non-pressure chronic ulcer of left ankle with fat layer ex Modifier: posed Quantity: 1 Physician Procedures CPT4 Code: [id number] Description: [id number] - WC PHYS SUBQ TISS 20 SQ CM ICD-10 Diagnosis Description [id number] Non-pressure chronic ulcer of left ankle with fat lay Modifier: er exposed Quantity: 1 Electronic Signature(s) Signed: 05/13/2019 6:34:01 PM By: Linton Ham MD Entered By: Linton Ham on 05/13/2019 10:48:09

## 2019-05-20 ENCOUNTER — Other Ambulatory Visit: Payer: Self-pay

## 2019-05-20 ENCOUNTER — Encounter (HOSPITAL_BASED_OUTPATIENT_CLINIC_OR_DEPARTMENT_OTHER): Payer: Medicare Other | Admitting: Internal Medicine

## 2019-05-20 DIAGNOSIS — E10622 Type 1 diabetes mellitus with other skin ulcer: Secondary | ICD-10-CM | POA: Diagnosis not present

## 2019-05-20 NOTE — Progress Notes (Signed)
[name], [name] ([id number]) Visit Report for 05/20/2019 HPI Details Patient Name: Date of Service: [name] 05/20/2019 10:00 AM Medical Record [id number] Patient Account Number: [id number] Date of Birth/Sex: Treating RN: 1956/05/03 (63 y.o. F) Primary Care Provider: [name] Other Clinician: Referring Provider: Treating Provider/Extender:[name], [name] in Treatment: 336 History of Present Illness HPI Description: the remaining wound is over the left medial ankle. Similar wound over the right medial ankle healed largely with use of Apligraf. Most recently we have been using Hydrofera Blue over this wound with considerable improvement. The patient has been extensively worked up in the past for her venous insufficiency and she is not a candidate for antireflux surgery although I have none of the details available currently. 08/24/14; considerable improvement today. About 50% of this wound areas now epithelialized. The base of the wound appears to be healthier granulation.as opposed to last week when she had deteriorated a considerable improvement 08/17/14; unfortunately the wound has regressed somewhat. The areas of epithelialization from the superior aspect are not nearly as healthy as they were last week. The patient thinks her Hydrofera Blue slipped. 09/07/14; unfortunately the area has markedly regressed in the 2 weeks since I've seen this. There is an odor surrounding erythema. The healthy granulation tissue that we had at the base of the wound now is a dusky color. The nurse reports green drainage 09/14/14; the area looks somewhat better than last week. There is less erythema and less drainage. The culture I did did not show any growth. Nevertheless I think it is better to continue the Cipro and doxycycline for a further week. The remaining wound area was debridement. 09/21/14. Wound did not require debridement last week. Still less erythema and  less drainage. She can complete her antibiotics. The areas of epithelialization in the superior aspect of the wound do not look as healthy as they did some weeks ago 10/05/14 continued improvement in the condition of this wound. There is advancing epithelialization. Less aggressive debridement required 10/19/14 continued improvement in the condition and volume of this wound. Less aggressive debridement to the inferior part of this to remove surface slough and fibrinous eschar 11/02/14 no debridement is required. The surface granulation appears healthy although some of her islands of epithelialization seem to have regressed. No evidence of infection 11/16/14; lites surface debridement done of surface eschar. The wound does not look to be unhealthy. No evidence of infection. Unfortunately the patient has had podiatry issues in the right foot and for some reason has redeveloped small surface ulcerations in the medial right ankle. Her original presentation involved wounds in this area 11/23/14 no debridement. The area on the right ankle has enlarged. The left ankle wound appears stable in terms of the surface although there is periwound inflammation. There has been regression in the amount of new skin 11/30/14 no debridement. Both wound areas appear healthy. There was no evidence of infection. The the new area on the right medial ankle has enlarged although that both the surfaces appear to be stable. 12/07/14; Debridement of the right medial ankle wound. No no debridement was done on the left. 12/14/14 no major change in and now bilateral medial ankle wounds. Both of these are very painful but the no overt evidence of infection. She has had previous venous ablation 12/21/14; patient states that her right medial ankle wound is considerably more painful last week than usual. Her left is also somewhat painful. She could not tolerate debridement. The right medial ankle wound has  fibrinous surface eschar 12/28/14  this is a patient with severe bilateral venous insufficiency ulcers. For a considerable period of time we actually had the one on the right medial ankle healed however this recently opened up again in June. The left medial ankle wound has been a refractory area with some absent flows. We had some success with Hydrofera Blue on this area and it literally closed by 50% however it is recently opened up Foley. Both of these were debridement today of surface eschar. She tolerates this poorly 01/25/15: No change in the status of this. Thick adherent escar. Very poor tolerance of any attempt at debridement. I had healed the right medial malleolus wound for a considerable amount of time and had the left one down to about 50% of the volume although this is totally regressed over the last 48 weeks. Further the right leg has reopened. she is trying to make a appointment with pain and vascular, previous ablations with Dr. Aleda Grana. I do not believe there is an arterial insufficiency issue here 02/01/15 the status of the adherent eschar bilaterally is actually improved. No debridement was done. She did not manage to get vascular studies done 02/08/15 continued debridement of the area was done today. The slough is less adherent and comes off with less pressure. There is no surrounding infection peripheral pulses are intact 02/15/15 selective debridement with a disposable curette. Again the slough is less adherent and comes off with less difficulty. No surrounding infection peripheral pulses are intact. 02/22/15 selective debridement of the right medial ankle wound. Slough comes off with less difficulty. No obvious surrounding infection peripheral pulses are intact I did not debridement the one on the left. Both of these are stable to improved 03/01/15 selective debridement of both wound areas using a curette to. Adherent slough cup soft with less difficulty. No obvious surrounding infection. The patient tells me  that 2 days ago she noted a rash above the right leg wrap. She did not have this on her lower legs when she change this over she arrives with widespread left greater than right almost folliculitis-looking rash which is extremely pruritic. I don't see anything to culture here. There is no rash on the rest of her body. She feels well systemically. 03/08/15; selective debridement of both wounds using a curette. Base of this does not look unhealthy. She had limegreen drainage coming out of the left leg wound and describes a lot of drainage. The rash on her left leg looks improved to. No cultures were done. 03/22/15; patient was not here last week. Basal wounds does not look healthy and there is no surrounding erythema. No drainage. There is still a rash on the left leg that almost looks vasculitic however it is clearly limited to the top of where the wrap would be. 04/05/15; on the right required a surgical debridement of surface eschar and necrotic subcutaneous tissue. I did not debridement the area on the left. These continue to be large open wounds that are not changing that much. We were successful at one point in healing the area on the right, and at the same time the area on the left was roughly half the size of current measurements. I think a lot of the deterioration has to do with the prolonged time the patient is on her feet at work 04/19/15 I attempted-like surface debridement bilaterally she does not tolerate this. She tells me that she was in allergic care yesterday with extreme pain over her left lateral malleolus/ankle and  was told that she has an "sprain" 05/03/15; large bilateral venous insufficiency wounds over the medial malleolus/medial aspect of her ankles. She complains of copious amounts of drainage and his usual large amounts of pain. There is some increasing erythema around the wound on the right extending into the medial aspect of her foot to. historically she came in with  these wounds the right one healed and the left one came down to roughly half its current size however the right one is reopened and the left is expanded. 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 05/10/15 large bilateral venous insufficiency wounds. There is less adherence surface left however the surface culture that I did last week grew pseudomonas therefore bilateral selective debridement score necessary. There is surrounding erythema. The patient describes severe bilateral drainage and a lot of pain in the left ankle. Apparently her podiatrist was were ready to do a cortisone shot 05/17/15; the patient complains of pain and again copious amounts of drainage. 05/24/15; we used Iodo flex last week. Patient notes considerable improvement in wound drainage. Only needed to change this once. 05/31/15; we continued Iodoflex; the base of these large wounds bilaterally is not too bad but there is probably likely a significant bioburden here. I would like to debridement just doesn't tolerate it. 06/06/14 I would like to continue the Iodoflex although she still hasn't managed to obtain supplies. She has bilateral medial malleoli or large wounds which are mostly superficial. Both of them are covered circumferentially with some nonviable fibrinous slough although she tolerates debridement very poorly. She apparently has an appointment for an ablation on the right leg by interventional radiology. 06/14/15; the patient arrives with the wounds and static condition. We attempted a debridement although she does not do well with this secondary to pain. I 07/05/15; wounds are not much smaller however there appears to be a cleaner granulating base. The left has tight fibrinous slough greater than the right. Debridement is tolerated poorly due to pain. Iodoflex is done more for these wounds in any of the multitude of different dressings I have tried on the left 1 and  then subsequently the right. 07/12/15; no change in the condition of this wound. I am able to do an aggressive debridement on the right but not the left. She simply cannot tolerate it. We have been using Iodoflex which helps somewhat. It is worthwhile remembering that at one point we healed the right medial ankle wound and the left was about 25% of the current circumference. We have suggested returning to vascular surgery for review of possible further ablations for one reason or another she has not been able to do this. 07/26/15 no major change in the condition of either wound on her medial ankle. I did not attempt to debridement of these. She has been aggressively scrubbing these while she is in the shower at home. She has her supply of Iodoflex which seems to have done more for these wounds then anything I have put on recently. 08/09/15 wound area appears larger although not verified by measurements. Using Iodoflex 09/05/2015 -- she was here for avisit today but had significant problems with the wound and I was asked to see her for a physician opinion. I have summarize that this lady has had surgery on her left lower extremity about 10 years ago where the possible veins stripping was done. She has had an opinion from interventional radiology around November  2016 where no further sclerotherapy was ordered. The patient works 12 hours a day and stands on a concrete floor with work boots and is unable to get the proper compression she requires and cannot elevate her limbs appropriately at any given time. She has recently grown Pseudomonas from her wound culture but has not started her ciprofloxacin which was called in for her. 09/13/15 this continues to be a difficult situation for this patient. At one point I had this wound down to a 1.5 x 1.5" wound on her left leg. This is deteriorated and the right leg has reopened. She now has substantial wounds on her medial calcaneus, malleoli and into her lower  leg. One on the left has surface eschar but these are far too painful for me to debridement here. She has a vascular surgery appointment next week to see if anything can be done to help here. I think she has had previous ablations several years ago at [facility]. She has no major edema. She tells me that she did not get product last time [facility] and went for several days without it. She continues to work in work boots 12 hours a day. She cannot get compression/4-layer under her work boots. 09/20/15 no major change. Periwound edema control was not very good. Her point with pain and vascular is next Wednesday the 25th 09/28/15; the patient is seen vascular surgery and is apparently scheduled for repeat duplex ultrasounds of her bilateral lower legs next week. 10/05/15; the patient was seen by Dr. Doren Custard of vascular surgery. He feels that she should have arterial insufficiency excluded as cause/contributed to her nonhealing stage she is therefore booked for an arteriogram. She has apparently monophasic signals in the dorsalis pedis pulses. She also of course has known severe chronic venous insufficiency with previous procedures as noted previously. I had another long discussion with the patient today about her continuing to work 12 hour shifts. I've written her out for 2 months area had concerns about this as her work location is currently undergoing significant turmoil and this may lead to her termination. She is aware of this however I agree with her that she simply cannot continue to stand for 12 hours multiple days a week with the substantial wound areas she has. 10/19/15; the Dr. Doren Custard appointment was largely for an arteriogram which was normal. She does not have an arterial issue. He didn't make a comment about her chronic venous insufficiency for which she has had previous ablations. Presumably it was not felt that anything additional could be done. The patient is now out of work as I  prescribed 2 weeks ago. Her wounds look somewhat less aggravated presumably because of this. I felt I would give debridement another try today 10/25/15; no major change in this patient's wounds. We are struggling to get her product that she can afford into her own home through her insurance. 11/01/15; no major change in the patient's wounds. I have been using silver alginate as the most affordable product. I spoke to Dr. Marla Roe last week with her requested take her to the OR for surgical debridement and placement of ACEL. Dr. Marla Roe told me that she would be willing to do this however [facility] will not cover this, fortunately the patient has [place] healthcare of some variant 11/08/15; no major change in the patient's wounds. She has been completely nonviable surface that this but is in too much pain with any attempted debridement are clinic. I have arranged for  her to see Dr. Marla Roe ham of plastic surgery and this appointment is on Monday. I am hopeful that they will take her to the OR for debridement, possible ACEL ultimately possible skin graft 11/22/15 no major change in the patient's wounds over her bilateral medial calcaneus medial malleolus into the lower legs. Surface on these does not look too bad however on the left there is surrounding erythema and tenderness. This may be cellulitis or could him sleepy tinea. 11/29/15; no major changes in the patient's wounds over her bilateral medial malleolus. There is no infection here and I don't think any additional antibiotics are necessary. There is now plan to move forward. She sees Dr. Marla Roe in a week's time for preparation for operative debridement and ACEL placement I believe on 7/12. She then has a follow-up appointment with Dr. Marla Roe on 7/21 12/28/15; the patient returns today having been taken to the [facility] by Dr. Marla Roe 12/12/15 she underwent debridement, intraoperative cultures [which were negative]. She had  placement of a wound VAC. Parent really ACEL was not available to be placed. The wound VAC foam apparently adhered to the wound since then she's been using silver alginate, Xeroform under Ace wraps. She still says there is a lot of drainage and a lot of pain 01/31/16; this is a patient I see monthly. I had referred her to Dr. Marla Roe him of plastic surgery for large wounds on her bilateral medial ankles. She has been to the OR twice once in early July and once in early August. She tells me over the last 3 weeks she has been using the wound VAC with ACEL underneath it. On the right we've simply been using silver alginate. Under Kerlix Coban wraps. 02/28/16; this is a patient I'm currently seeing monthly. She is gone on to have a skin graft over her large venous insufficiency ulcer on the left medial ankle. This was done by Dr. Marla Roe him. The patient is a bit perturbed about why she didn't have one on her right medial ankle wound. She has been using silver alginate to this. 03/06/16; I received a phone call from her plastic surgery Dr. Marla Roe. She expressed some concern about the viability of the skin graft she did on the left medial ankle wound. Asked me to place Endoform on this. She told me she is not planning to do a subsequent skin graft on the right as the left one did not take very well. I had placed Hydrofera Blue on the right 03/13/16; continue to have a reasonably healthy wound on the right medial ankle. Down to 3 mm in terms of size. There is epithelialization here. The area on the left medial ankle is her skin graft site. I suppose the last week this looks somewhat better. She has an open area inferiorly however in the center there appears to be some viable tissue. There is a lot of surface callus and eschar that will eventually need to come off however none of this looked to be infected. Patient states that the is able to keep the dressing on for several days which is an  improvement. 03/20/16 no major change in the circumference of either wound however on the left side the patient was at Dr. Eusebio Friendly office and they did a debridement of left wound. 50% of the wound seems to be epithelialized. I been using Endoform on the left Hydrofera Blue in the right 03/27/16; she arrives today with her wound is not looking as healthy as they did last week.  The area on the right clearly has an adherent surface to this a very similar surface on the left. Unfortunately for this patient this is all too familiar problem. Clearly the Endoform is not working and will need to change that today that has some potential to help this surface. She does not tolerate debridement in this clinic very well. She is changing the dressing wants 04/03/16; patient arrives with the wounds looking somewhat better especially on the right. Dr. Migdalia Dk change the dressing to silver alginate when she saw her on Monday and also sold her some compression socks. The usefulness of the latter is really not clear and woman with severely draining wounds. 04/10/16; the patient is doing a bit of an experiment wearing the compression stockings that Dr. Migdalia Dk provided her to her left leg and the out of legs based dressings that we provided to the right. 05/01/16; the patient is continuing to wear compression stockings Dr. Migdalia Dk provided her on the left that are apparently silver impregnated. She has been using Iodoflex to the right leg wound. Still a moderate amount of drainage, when she leaves here the wraps only last for 4 days. She has to change the stocking on the left leg every night 05/15/16; she is now using compression stockings bilaterally provided by Dr. Marla Roe. She is wearing a nonadherent layer over the wounds so really I don't think there is anything specific being done to this now. She has some reduction on the left wound. The right is stable. I think all healing here is being done without a  specific dressing 06/09/16; patient arrives here today with not much change in the wound certainly in diameter to large circular wounds over the medial aspect of her ankle bilaterally. Under the light of these services are certainly not viable for healing. There is no evidence of surrounding infection. She is wearing compression stockings with some sort of silver impregnation as prescribed by Dr. Marla Roe. She has a follow-up with her tomorrow. 06/30/16; no major change in the size or condition of her wounds. These are still probably covered with a nonviable surface. She is using only her purchase stockings. She did see Dr. Marla Roe who seemed to want to apply Dakin's solution to this I'm not extreme short what value this would be. I would suggest Iodoflex which she still has at home. 07/28/16; I follow Mrs. Bolz episodically along with Dr. Marla Roe. 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 She has been applying a topical collagen ointment to both wounds with Adaptic. I don't think Dr. Marla Roe is planning to take her back to the OR. 08/19/16; I follow Mrs. Jeneen Rinks on a monthly basis along with Dr. Marla Roe of plastic surgery. 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 I been following her for a number of years. At one point I was able to get the right medial malleolus wound to heal and had the left medial malleolus down to about half its current size however and I had to send her to plastic surgery for an operative debridement. Since then things have been stable to slightly improve the area on the right is slightly better one in the left about the same although there is much less adherent surface than I'm used to with this patient. She is using some form of liquid collagen gel that Dr. Marla Roe provided a Kerlix cover with the patient's own  pressure stockings.  She tells me that she has extreme pain in both ankles and along the lateral aspect of both feet. She has been unable to work for some period of time. She is telling me she is retiring at the beginning of April. She sees Dr. Doran Durand of orthopedics next week 09/22/16; patient has not seen Dr. Marla Roe since the last time she is here. I'm not really sure what she is using to the wounds other than bits and pieces of think she had left over including most recently Hydrofera Blue. She is using juxtalite stockings. She is having difficulty with her husband's recent illness "stroke". She is having to transport him to various doctors appointments. Dr. Marla Roe left her the option of a repeat debridement with ACEL however she has not been able to get the time to follow-up on this. She continues to have a fair amount of drainage out of these wounds with certainly precludes leaving dressings on all week 10/13/16; patient has not seen Dr. Marla Roe since she was last in our clinic. I'm not really sure what she is doing with the wounds, we did try to get her [facility] and I think she is actually using this most of the time. Because of drainage she states she has to change this every second day although this is an improvement from what she used to do. She went to see Dr. Doran Durand who did not think she had a muscular issue with regards to her feet, he referred her to a neurologist and I think the appointment is sometime in June. I changed her back to Iodoflex which she has used in the past but not recently. 11/03/16; the patient has been using Iodoflex although she ran out of this. Still claims that there is a lot of drainage although the wound does not look like this. No surrounding erythema. She has not been back to see Dr. Marla Roe 11/24/16; the patient has been using Iodoflex again but she ran out of it 2 or 3 days ago. There is no major change in the condition of either one of these  wounds in fact they are larger and covered in a thick adherent surface slough/nonviable tissue especially on the left. She does not tolerate mechanical debridement in our clinic. Going back to see Dr. Marla Roe of plastic surgery for an operative debridement would seem reasonable. 12/15/16; the patient has not been back to see Dr. Marla Roe. She is been dealing with a series of illnesses and her husband which of monopolized her time. She is been using Sorbact which we largely supplied. She states the drainage is bad enough that it maximum she can go 2-3 days without changing the dressing 01/12/2017 -- the patient has not been back for about 4 weeks and has not seen Dr. Marla Roe not does she have any appointment pending. 01/23/17; patient has not seen Dr. Marla Roe even though I suggested this previously. She is using Santyl that was suggested last week by Dr. Con Memos this Cost her $16 through her insurance which is indeed surprising 02/12/17; continuing Santyl and the patient is changing this daily. A lot of drainage. She has not been back to see plastic surgery she is using an Ace wrap. Our intake nurse suggested wrap around stockings which would make a good reasonable alternative 02/26/17; patient is been using Santyl and changing this daily due to drainage. She has not been to see plastic surgery she uses in April Ace wrap to control the edema. She did obtain extremitease stockings but stated  that the edema in her leg was to big for these 03/20/17; patient is using Santyl and Anasept. Surfaces looked better today the area on the right is actually measuring a little smaller. She has states she has a lot of pain in her feet and ankles and is asking for a consult to pain control which I'll try to help her with through our case manager. 04/10/17; the patient arrives with better-looking wound surfaces and is slightly smaller wound on the left she is using a combination of Santyl and Anasept. She has  an appointment or at least as started in the pain control center associated with [facility] 05/14/17; this is a patient who I followed for a prolonged period of time. 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 At one point I had this down to a much smaller wound on the left however these reopened and we've never been able to get these to heal. She has been using Santyl and Anasept gel although 2 weeks ago she ran out of the Anasept gel. She has a stable appearance of the wound. She is going to the wound care clinic at [facility]. They wanted do a nerve block/spinal block although she tells me she is reluctant to go forward with that. 05/21/17; this is a patient I have followed for many years. 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 Chronic pain and deformity in her ankles as well. She is been to see plastic surgery as well as orthopedics. Using PolyMem AG most recently/Kerramax/ABDs and 2 layer compression. She has managed to keep this on and she is coming in for a nurse check to change the dressing on Tuesdays, we see her on Fridays 06/05/17; really quite a good looking surface and the area especially on the right medial has contracted in terms of dimensions. Well granulated healthy-looking tissue on both sides. Even with an open curet there is nothing that even feels abnormal here. This is as good as I've seen this in quite some time. We have been using PolyMem AG and bringing her in for a nurse check 06/12/17; really quite good surface on both of these wounds. The right medial has contracted a bit left is not. We've been using PolyMem and AG and she is coming in for a nurse visit 06/19/17; we have been using PolyMem AG and bringing her in for a nurse check. Dimensions of her wounds are not better but the surfaces looked better bilaterally. She complained of bleeding last night and the left wound and increasing pain bilaterally. She  states her wound pain is more neuropathic than just the wounds. There was some suggestion that this was radicular from her pain management doctor in talking to her it is really difficult to sort this out. 06/26/17; using PolyMem and AG and bringing her in for a nurse check as All of this and reasonably stable condition. Certainly not improved. The dimensions on the lateral part of the right leg look better but not really measuring better. The medial aspect on the left is about the same. 07/03/16; we have been using PolyMen AG and bringing her in for a nurse check to change the dressings as the wounds have drainage which precludes once weekly changing. We are using all secondary absorptive dressings.our intake nurse is brought up the idea of using a wound VAC/snap VAC on the wound to help with the drainage to see if this would result in some contraction. This is not a  bad idea. The area on the right medial is actually looking smaller. Both wounds have a reasonable-looking surface. There is no evidence of cellulitis. The edema is well controlled 07/10/17; the patient was denied for a snap VAC by her insurance. The major issue with these wounds continues to be drainage. We are using wicked PolyMem AG and she is coming in for a nurse visit to change this. The wounds are stable to slightly improved. The surface looks vibrant and the area on the right certainly has shrunk in size but very slowly 07/17/17; the patient still has large wounds on her bilateral medial malleoli. Surface of both of these wounds looks better. The dimensions seem to come and go but no consistent improvement. There is no epithelialization. We do not have options for advanced treatment products due to insurance issues. They did not approve of the wound VAC to help control the drainage. More recently we've been using PolyMem and AG wicked to allow drainage through. We have been bringing her in for a nurse visit to change this. We do not  have a lot of options for wound care products and the home again due to insurance issues 07/24/17; the patient's wound actually looks somewhat better today. No drainage measurements are smaller still healthy-looking surface. We used silver collagen under PolyMen started last week. We have been bringing her in for a dressing change 07/31/17; patient's wound surface continued to look better and I think there is visible change in the dimensions of the wound on the right. Rims of epithelialization. We have been using silver collagen under PolyMen and bringing her in for a dressing change. There appears to be less drainage although she is still in need of the dressing change 08/07/17. Patient's wound surface continues to look better on both sides and the area on the right is definitely smaller. We have been using silver collagen and PolyMen. She feels that the drainage has been it has been better. I asked her about her vascular status. She went to see Dr. Aleda Grana at [facility] and had some form of ablation. I don't have much detail on this. I haven't my notes from 2016 that she was not a candidate for any further ablation but I don't have any more information on this. We had referred her to vein and vascular I don't think she ever went. He does not have a history of PAD although I don't have any information on this either. We don't even have ABIs in our record 08/14/17; we've been using silver collagen and PolyMen cover. And putting the patient and compression. She we are bringing her in as a nurse visit to change this because ofarge amount of drainage. We didn't the ABIs in clinic today since they had been done in many moons 1.2 bilaterally. She has been to see vein and vascular however this was at [facility] and she had ablation although I really don't have any information on this all seemed biking get a report. She is also been operatively debrided by plastic surgery and had a cell placed  probably 8-12 months ago. This didn't have a major effect. We've been making some gains with current dressings 08/19/17-She is here in follow-up evaluation for bilateral medial malleoli ulcers. She continues to tolerate debridement very poorly. We will continue with recently changed topical treatment; if no significant improvement may consider switching to Iodosorb/Iodoflex. 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 She has been using silver collagen and  PolyMem. I believe she has been used and tried on Iodoflex before. 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 These have been very refractory over time. We have been using silver collagen and PolyMen. She is coming in once a week for a doctor's and once a week for nurse visits. We are actually making some progress 09/18/17; the patient's wounds are smaller especially on the right medial. She arrives today to upset to consider even washing these off with Anasept which I think is been part of the reason this is been closing. We've been using collagen covered in PolyMen otherwise. It is noted that she has a small area of folliculitis on the right medial calf that. As we are wrapping her legs I'll give her a short course of doxycycline to make sure this doesn't amount to anything. She is a long list of complaints today including imbalance, shortness of breath on exertion, inversion of her left ankle. With regards to the latter complaints she is been to see orthopedics and they offered her a tendon release surgery I believe but wanted her wounds to be closed first. I have recommended she go see her primary physician with regards to everything else. 09/25/17; patient's wounds are about the same size. We have made  some progress bilaterally although not in recent weeks. She will not allow me T wash these wounds with Anasept even if she is doing her cell. Wheeze we've been using collagen covered in PolyMen. Last week she had a small area of folliculitis this is now opened into a small wound. She completed 5 days of trimethoprim sulfamethoxazole 10/02/17; unfortunately the area on her left medial ankle is worse with a larger wound area towards the Achilles. The patient complains of a lot of pain. She will not allow debridement although visually I don't think there is anything to debridement in any case. We have been using silver collagen and PolyMen for several months now. Initially we are making some progress although I'm not really seeing that today. We will move back to [facility]. His admittedly this is a bit of a repeat however I'm hoping that his situation is different now. The patient tells me she had her leg on the left give out on her yesterday this is process some pain. 10/09/17; the patient is seen twice a week largely because of drainage issues coming out of the chronic medial bimalleolar wounds that are chronic. Last week the dimensions of the one on the left looks a little larger I changed her to [facility]. She comes in today with a history of terrible pain in the bilateral wound areas. She will not allow debridement. She will not even allow a tissue culture. There is no surrounding erythema no no evidence of cellulitis. We have been putting her Kerlix Coban man. She will not allow more aggressive compression as there was a suggestion to put her in 3 layer wraps. 10/16/17; large wounds on her bilateral medial malleoli. These are chronic. Not much change from last week. The surface looks have healthy but absolutely no epithelialization. A lot of pain little less so of drainage. She will not allow debridement or even washing these off in the vigorous fashion with Anasept. 10/23/17; large wounds  on her bilateral malleoli which are chronic. Some improvement in terms of size  perhaps on the right since last time I saw these. She states that after we increased the 3 layer compression there was some bleeding, when she came in for a nurse visit she did not want 3 layer compression put back on about our nurse managed to convince her. She has known chronic venous visit issues and I'm hoping to get her to tolerate the 3 layer compression. using Hydrofera Blue 10/30/17; absolutely no change in the condition of either wound although we've had some improvement in dimensions on the right.. Attempted to put her in 3 layer compression she didn't tolerated she is back in 2 layer compression. We've been using Hydrofera Blue We looked over her past records. She had venous reflux studies in November 2016. There was no evidence of deep venous reflux on the right. Superficial vein did not show the greater saphenous vein at think this is been previously ablated the small saphenous vein was within normal limits. The left deep venous system showed no DVT the vessels were positive for deep venous reflux in the posterior tibial veins at the ankle. The greater saphenous vein was surgically absent small saphenous vein was within normal limits. She went to vein and vascular at [facility]. I believe she had an ablation on the left greater saphenous vein. I'll update her reflux studies perhaps ever reviewed by vein and vascular. We've made absolutely no progress in these wounds. Will also try to read and TheraSkins through her insurance 11/06/17; W the patient apparently has a 2 week follow-up with vein and vascular I like him to review the whole issue with regards to her previous vascular workup by Dr. Aleda Grana. We've really made no progress on these wounds in many months. She arrives today with less viable looking surface on the left medial ankle wound. This was apparently looking about the same on Tuesday when  she was here for nurse visit. 11/13/17; deep tissue culture I did last time of the left lower leg showed multiple organisms without any predominating. In particular no Staphylococcus or group A strep were isolated. We sent her for venous reflux studies. She's had a previous left greater saphenous vein stripping and I think sclerotherapy of the right greater saphenous vein. She didn't really look at the lesser saphenous vein this both wounds are on the medial aspect. She has reflux in the common femoral vein and popliteal vein and an accessory vein on the right and the common femoral vein and popliteal vein on the left. I'm going to have her go to see vein and vascular just the look over things and see if anything else beside aggressive compression is indicated here. We have not been able to make any progress on these wounds in spite of the fact that the surface of the wounds is never look too bad. 11/20/17; no major change in the condition of the wounds. Patient reports a large amount of drainage. She has a lot of complaints of pain although enlisting her today I wonder if some of this at least his neuropathic rather than secondary to her wounds. She has an appointment with vein and vascular on 12/30/17. The refractory nature of these wounds in my mind at least need vein and vascular to look over the wounds the recent reflux studies we did and her history to see if anything further can be done here. I also note her gait is deteriorated quite a bit. 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 She has a bilateral  Trendelenburg gait. I wonder if this is neuropathic or perhaps multilevel radicular. 11/27/17; her wounds actually looks slightly better. Healthy-looking granulation tissue a scant amount of epithelialization. [place] healthcare will not pay for [employer]. They will play for tri layer Oasis and Dermagraft. This is not a diabetic ulcer. We'll try for the tri layer Oasis. She still  complains of some drainage. She has a vein and vascular appointment on 12/30/17 12/04/17; the wounds visually look quite good. Healthy-looking granulation with some degree of epithelialization. We are still waiting for response to our request for trial to try layer Oasis. Her appointment with vascular to review venous and arterial issues isn't sold the end of July 7/31. Not allow debridement or even vigorous cleansing of the wound surface. 12/18/17; slightly smaller especially on the right. 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 We've been using Hydrofera Blue. We still are looking into triple layer Oasis through her insurance 01/08/18 on evaluation today patient's wound actually appears to be showing signs of good improvement at this point in time. She has been tolerating the dressing changes without complication. Fortunately there does not appear to be any evidence of infection at this point in time. We have been utilizing silver nitrate which does seem to be of benefit for her which is also good news. Overall I'm very happy with how things seem to be both regards appearance as well as measurement. Patient did see Dr. Bridgett Larsson for evaluation on 12/30/17. In his assessment he felt that stripping would not likely add much more than chronic compression to the patient's healing process. His recommendation was to follow-up in three months with Dr. Doren Custard if she hasn't healed in order to consider referral back to you and see vascular where she previously was in a trial and was able to get her wound to heal. I'll be see what she feels she when you staying compression and he reiterated this as well. 01/13/18 on evaluation today patient appears to actually be doing very well in regard to her bilateral medial malleolus ulcers. She seems to have tolerated the chemical cauterization with silver nitrate last week she did have some pain through that evening but fortunately states that I'll  be see since it seems to be doing better she is overall pleased with the progress. 01/21/18; really quite a remarkable improvement since I've last seen these wounds. We started using silver nitrate specially on the islands of hyper granulation which for some reason her around the wound circumference. This is really done quite nicely. Primary dressing Hydrofera Blue under 4 layer compression. She seems to be able to hold out without a nurse rewrap. Follow-up in 1 week 01/28/18; we've continued the hydrofera blue but continued with chemical cauterization to the wound area that we started about a month ago for irregular hyper granulation. She is made almost stunning improvement in the overall wound dimensions. I was not really expecting this degree of improvement in these chronic wounds 02/05/18; we continue with Hydrofera Bluebut of also continued the aggressive chemical cauterization with silver nitrate. We made nice progress with the right greater than left wound. 02/12/18. We continued with Hydrofera Blue after aggressive chemical cauterization with silver nitrate. We appear to be making nice progress with both wound areas 02/19/2018; we continue with [facility] after washing the wounds vigorously with Anasept spray and chemical cauterization with silver nitrate. We are making excellent progress. The area on the right's just about closed 02/26/2018. The area on the left medial ankle had  too much necrotic debris today. I used a #5 curette we are able to get most of the soft. I continued with the silver nitrate to the much smaller wound on the right medial ankle she had a new area on her right lower pretibial area which she says was due to a role in her compression 03/05/2018; both wound areas look healthy. Not much change in dimensions from last week. I continue to use silver nitrate and Hydrofera Blue. The patient saw Dr. Doren Custard of vein and vascular. He felt she had venous stasis ulcers. He felt  based on her previous arteriogram she should have adequate circulation for healing. Also she has deep venous reflux but really no significant correctable superficial venous reflux at this time. He felt we should continue with conservative management including leg elevation and compression 04/02/2018; since we last saw this woman about a month ago she had a fall apparently suffered a pelvic fracture. I did not look up the x-ray. Nevertheless because of pain she literally was bedbound for 2 weeks and had home health coming out to change the dressing. Somewhat predictably this is resulted in considerable improvement in both wound areas. The right is just about closed on the medial malleolus and the left is about half the size. 04/16/2018; both her wounds continue to go down in size. Using Hydrofera Blue. 05/07/18; both her wounds appeared to be improving especially on the right where it is almost closed. We are using Hydrofera Blue 05/14/2018; slightly worse this week with larger wounds. Surface on the left medial not quite as good. We have been using Hydrofera Blue 05/21/18; again the wounds are slightly larger. Left medial malleolus slightly larger with eschar around the circumference. We have been using Hydrofera Blue undergoing a wraps for a prolonged period of time. This got a lot better when she was more recumbent due to a fall and a back injury. I change the primary dressing the silver alginate today. She did not tolerate a 4 layer compression previously although I may need to bring this up with her next time 05/28/2018; area on the left medial malleolus again is slightly larger with more drainage. Area on the right is roughly unchanged. She has a small area of folliculitis on the right medial just on the lower calf. This does not look ominous. 06/03/2018 left medial malleolus slightly smaller in a better looking surface. We used silver nitrate on this last time with silver alginate. The area  on the right appears slightly smaller 1/10; left medial malleolus slightly smaller. Small open area on the right. We used silver nitrate and silver alginate as of 2 weeks ago. We continue with the wound and compression. These got a lot better when she was off her feet 1/17; right medial malleolus wound is smaller. The left may be slightly smaller. Both surfaces look somewhat better. 1/24; both wounds are slightly smaller. Using silver alginate under Unna boots 1/31; both wounds appear smaller in fact the area on the right medial is just about closed. Surface eschar. We have been using silver alginate under Unna boots. The patient is less active now spends let much less time on her feet and I think this is contributed to the general improvement in the wound condition 2/7; both wounds appear smaller. I was hopeful the right medial would be closed however there there is still the same small open area. Slight amount of surface eschar on the left the dimensions are smaller there is eschar but the wound  edges appear to be free. We have been using silver alginate under Unna boot's 2/14; both wounds once again measure smaller. Circumferential eschar on the left medial. We have been using silver alginate under Unna boots with gradual improvement 2/21; the area on the right medial malleolus has healed. The area on the left is smaller. We have been using silver alginate and Unna boots. We can discharge wrapping the right leg she has 20/30 stockings at home she will need to protect the scar tissue in this area 2/28; the area on the right medial malleolus remains closed the patient has a compression stocking. The area on the left is smaller. We have been using silver alginate and Unna boots. 3/6 the area on the right medial ankle remains closed. Good edema control noted she is using her own compression stocking. The area on the left medial ankle is smaller. We have been managing this with silver alginate and  Unna boots which we will continue today. 3/13; the area on the right medial ankle remains closed and I'm declaring it healed today. When necessary the left is about the same still a healthy-looking surface but no major change and wound area. No evidence of infection and using silver alginate under unna and generally making considerable improvement 3/27 the area on the right medial ankle remains closed the area on the left is about the same as last week. Certainly not any worse we have been using silver alginate under an Unna boot 4/3; the area on the right medial ankle remains closed per the patient. We did not look at this wound. The wound on the left medial ankle is about the same surface looks healthy we have been using silver alginate under an Unna boot 4/10; area on the right medial ankle remains closed per the patient. We did not look at this wound. The wound on the left medial ankle is slightly larger. The patient complains that the [facility] caused burning pain all week. She also told us that she was a lot more active this week. Changed her back to silver alginate 4/17; right medial ankle still closed per the patient. Left medial ankle is slightly larger. Using silver alginate. She did not tolerate Hydrofera Blue on this area 4/24; right medial ankle remains closed we have not look at this. The left medial ankle continues to get larger today by about a centimeter. We have been using silver alginate under Unna boots. She complains about 4 layer compression as an alternative. She has been up on her feet working on her garden 5/8; right medial ankle remains closed we did not look at this. The left medial ankle has increased in size about 100%. We have been using silver alginate under Unna boots. She noted increased pain this week and was not surprised that the wound is deteriorated 5/15; no major change in SA however much less erythema ( one week of doxy ocellulitis). 5/22-63 year old  female returns at 1 week to the clinic for left medial ankle wound for which we have been using silver alginate under 3 layer compression She was placed on DOXY at last visit - the wound is wider at this visit. She is in 3 layer compression 5/29; change to [facility] last week. I had given her empiric doxycycline 2 weeks ago for a week. She is in 3 layer compression. She complains of a lot of pain and drainage on presentation today. 6/5; using Hydrofera Blue. I gave her doxycycline recently empirically for erythema and  pain around the wound. Believe her cultures showed enterococcus which not would not have been well covered by doxycycline nevertheless the wound looks better and I don't feel specifically that the enterococcus needs to be covered. She has a new what looks like a wrap injury on her lateral left ankle. 6/12; she is using Hydrofera Blue. She has a new area on the left anterior lower tibial area. This was a wrap injury last week. 6/19; the patient is using Hydrofera Blue. She arrived with marked inflammation and erythema around the wound and tenderness. 12/01/18 on evaluation today patient appears to be doing a little bit better based on what I'm hearing from the standpoint of lassos evaluation to this as far as the overall appearance of the wound is concerned. Then sometime substandard she typically sees Dr. Dellia Nims. Nonetheless overall very pleased with the progress that she's made up to this point. No fevers, chills, nausea, or vomiting noted at this time. 7/10; some improvement in the surface area. Aggressively debrided last week apparently. I went ahead with the debridement today although the patient does not tolerate this very well. We have been using Iodoflex. Still a fair amount of drainage 7/17; slightly smaller. Using Iodoflex. 7/24; no change from last week in terms of surface area. We have been using Iodoflex. Surface looks and continues to look somewhat better 7/31;  surface area slightly smaller better looking surface. We have been using Iodoflex. This is under Unna boot compression 8/7-Patient presents at 1 week with Unna boot and Iodoflex, wound appears better 8/14-Patient presents at 1 week with Iodoflex, we use the Unna boot, wound appears to be stable better.Patient is getting Botox treatment for the inversion of the foot for tendon release, Next week 8/21; we are using Iodoflex. Unna boot. The wound is stable in terms of surface area. Under illumination there is some areas of the wound that appear to be either epithelialized or perhaps this is adherent slough at this point I was not really clear. It did not wipe off and I was reluctant to debride this today. 8/28; we are using Iodoflex in an Unna boot. Seems to be making good improvement. 9/4; using Iodoflex and wound is slightly smaller. 9/18; we are using Iodoflex with topical silver nitrate when she is here. The wound continues to be smaller 10/2; patient missed her appointment last week due to GI issues. She left and Iodoflex based dressing on for 2 weeks. Wound is about the same size about the size of a dime on the left medial lower 10/9 we have been using Iodoflex on the medial left ankle wound. She has a new superficial probable wrap injury on the dorsal left ankle 10/16; we have been using Hydrofera Blue since last week. This is on the left medial ankle 10/23; we have been using Hydrofera Blue since 2 weeks ago. This is on the left medial ankle. Dimensions are better 11/6; using Hydrofera Blue. I think the wound is smaller but still not closed. Left medial ankle 11/13; we have been using Hydrofera Blue. Wound is certainly no smaller this week. Also the surface not as good. This is the remanent of a very large area on her left medial ankle. 11/20; using Sorbact since last week. Wound was about the same in terms of size although I was disappointed about the surface debris 12/11; 3-week  follow-up. Patient was on vacation. Wound is measuring slightly larger we have been using Sorbact. 12/18; wound is about the same size however surface  looks better last week after debridement. We have been using Sorbact under compression Electronic Signature(s) Signed: 05/20/2019 6:01:02 PM By: Linton Ham MD Entered By: Linton Ham on 05/20/2019 17:57:34 -------------------------------------------------------------------------------- Physical Exam Details Patient Name: Date of Service: [name] 05/20/2019 10:00 AM Medical Record [id number] Patient Account Number: [id number] Date of Birth/Sex: Treating RN: [birth year] (63 y.o. F) Primary Care Provider: [name] Other Clinician: Referring Provider: Treating Provider/Extender:Wen Munford, Velva Harman, Essie Christine in Treatment: 336 Constitutional Sitting or standing Blood Pressure is within target range for patient.. Pulse regular and within target range for patient.Marland Kitchen Respirations regular, non-labored and within target range.. Temperature is normal and within the target range for the patient.Marland Kitchen Appears in no distress. Eyes Conjunctivae clear. No discharge.no icterus. Respiratory work of breathing is normal. Cardiovascular Pedal pulses are palpable on the left. Integumentary (Hair, Skin) No surrounding erythema suggesting infection. Psychiatric appears at normal baseline. Notes Wound exam; left medial malleolus. Surface area is about the same better looking wound surface even under illumination. No debridement was felt to be necessary. Electronic Signature(s) Signed: 05/20/2019 6:01:02 PM By: Linton Ham MD Entered By: Linton Ham on 05/20/2019 17:58:27 -------------------------------------------------------------------------------- Physician Orders Details Patient Name: Date of Service: [name] 05/20/2019 10:00 AM Medical Record [id number] Patient Account Number: [id number] Date  of Birth/Sex: Treating RN: [birth year] (63 y.o. 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 Dressing Change Frequency Wound #3 Left,Medial Malleolus Do not change entire dressing for one week. - change dressing in one week Skin Barriers/Peri-Wound Care Moisturizing lotion TCA Cream or Ointment - mixed with lotion Wound Cleansing Clean wound with Wound Cleanser - clean with anasept with dressing changes May shower with protection. Primary Wound Dressing Wound #3 Left,Medial Malleolus Other: - Sorbact. Apply foam to shin for protection Secondary Dressing Wound #3 Left,Medial Malleolus Dry Gauze Kerramax Edema Control Unna Boot to Left Lower Extremity - no kerlix layer Avoid standing for long periods of time Elevate legs to the level of the heart or above for 30 minutes daily and/or when sitting, a frequency of: - throughout the day Support Garment 20-30 mm/Hg pressure to: - compression stocking right leg daily Electronic Signature(s) Signed: 05/20/2019 5:42:17 PM By: Kela Millin Signed: 05/20/2019 6:01:02 PM By: Linton Ham MD Entered By: Kela Millin on 05/20/2019 11:56:03 -------------------------------------------------------------------------------- Problem List Details Patient Name: Date of Service: [name] 05/20/2019 10:00 AM Medical Record [id number] Patient Account Number: [id number] Date of Birth/Sex: Treating RN: 02/01/1956 (63 y.o. 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 08/03/2014 08/03/2014 and inflammation L03.116 Cellulitis of left lower limb 09/07/2014 09/07/2014 L97.321 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 breakdown 03/11/2019 03/11/2019 of skin Resolved Problems ICD-10 Code Description Active Date Resolved Date L97.312 Non-pressure  chronic ulcer of right ankle with fat layer 04/10/2016 04/10/2016 exposed Electronic Signature(s) Signed: 05/20/2019 6:01:02 PM By: Linton Ham MD Previous Signature: 05/20/2019 5:42:17 PM Version By: Kela Millin Entered By: Linton Ham on 05/20/2019 17:56:55 -------------------------------------------------------------------------------- Progress Note Details Patient Name: Date of Service: [name] 05/20/2019 10:00 AM Medical Record [id number] Patient Account Number: [id number] Date of Birth/Sex: Treating RN: 27-Apr-1956 (63 y.o. F) Primary Care Provider: [name] Other Clinician: Referring Provider: Treating Provider/Extender:Breianna Delfino, Velva Harman, Essie Christine in Treatment: 336 Subjective History of Present Illness (HPI) the remaining wound is over the left medial ankle. Similar wound over the right medial ankle healed largely with use of Apligraf. Most recently we have been using Hydrofera Blue over this wound with considerable improvement. The patient has been extensively worked up in the past for her venous insufficiency and she is not a candidate  for antireflux surgery although I have none of the details available currently. 08/24/14; considerable improvement today. About 50% of this wound areas now epithelialized. The base of the wound appears to be healthier granulation.as opposed to last week when she had deteriorated a considerable improvement 08/17/14; unfortunately the wound has regressed somewhat. The areas of epithelialization from the superior aspect are not nearly as healthy as they were last week. The patient thinks her Hydrofera Blue slipped. 09/07/14; unfortunately the area has markedly regressed in the 2 weeks since I've seen this. There is an odor surrounding erythema. The healthy granulation tissue that we had at the base of the wound now is a dusky color. The nurse reports green drainage 09/14/14; the area looks somewhat better than last week. There is less erythema and less drainage. The culture I did did not show any growth. Nevertheless I think it is better to continue the Cipro and doxycycline for a further week. The remaining wound area was debridement. 09/21/14. Wound did not require debridement last week. Still less erythema and less drainage. She can complete her antibiotics. The areas of epithelialization in the superior aspect of the wound do not look as healthy as they did some weeks ago 10/05/14 continued improvement in the condition of this wound. There is advancing epithelialization. Less aggressive debridement required 10/19/14 continued improvement in the condition and volume of this wound. Less aggressive debridement to the inferior part of this to remove surface slough and fibrinous eschar 11/02/14 no debridement is required. The surface granulation appears healthy although some of her islands of epithelialization seem to have regressed. No evidence of infection 11/16/14; lites surface debridement done of surface eschar. The wound does not look to be unhealthy. No evidence of infection. Unfortunately the patient  has had podiatry issues in the right foot and for some reason has redeveloped small surface ulcerations in the medial right ankle. Her original presentation involved wounds in this area 11/23/14 no debridement. The area on the right ankle has enlarged. The left ankle wound appears stable in terms of the surface although there is periwound inflammation. There has been regression in the amount of new skin 11/30/14 no debridement. Both wound areas appear healthy. There was no evidence of infection. The the new area on the right medial ankle has enlarged although that both the surfaces appear to be stable. 12/07/14; Debridement of the right medial ankle wound. No no debridement was done on the left. 12/14/14 no major change in and now bilateral medial ankle wounds. Both of these are very painful but the no overt evidence of infection. She has had previous venous ablation 12/21/14; patient  states that her right medial ankle wound is considerably more painful last week than usual. Her left is also somewhat painful. She could not tolerate debridement. The right medial ankle wound has fibrinous surface eschar 12/28/14 this is a patient with severe bilateral venous insufficiency ulcers. For a considerable period of time we actually had the one on the right medial ankle healed however this recently opened up again in June. The left medial ankle wound has been a refractory area with some absent flows. We had some success with Hydrofera Blue on this area and it literally closed by 50% however it is recently opened up Foley. Both of these were debridement today of surface eschar. She tolerates this poorly 01/25/15: No change in the status of this. Thick adherent escar. Very poor tolerance of any attempt at debridement. I had healed the right medial malleolus wound for a considerable amount of time and had the left one down to about 50% of the volume although this is totally regressed over the last 48 weeks. Further  the right leg has reopened. she is trying to make a appointment with pain and vascular, previous ablations with Dr. Aleda Grana. I do not believe there is an arterial insufficiency issue here 02/01/15 the status of the adherent eschar bilaterally is actually improved. No debridement was done. She did not manage to get vascular studies done 02/08/15 continued debridement of the area was done today. The slough is less adherent and comes off with less pressure. There is no surrounding infection peripheral pulses are intact 02/15/15 selective debridement with a disposable curette. Again the slough is less adherent and comes off with less difficulty. No surrounding infection peripheral pulses are intact. 02/22/15 selective debridement of the right medial ankle wound. Slough comes off with less difficulty. No obvious surrounding infection peripheral pulses are intact I did not debridement the one on the left. Both of these are stable to improved 03/01/15 selective debridement of both wound areas using a curette to. Adherent slough cup soft with less difficulty. No obvious surrounding infection. The patient tells me that 2 days ago she noted a rash above the right leg wrap. She did not have this on her lower legs when she change this over she arrives with widespread left greater than right almost folliculitis-looking rash which is extremely pruritic. I don't see anything to culture here. There is no rash on the rest of her body. She feels well systemically. 03/08/15; selective debridement of both wounds using a curette. Base of this does not look unhealthy. She had limegreen drainage coming out of the left leg wound and describes a lot of drainage. The rash on her left leg looks improved to. No cultures were done. 03/22/15; patient was not here last week. Basal wounds does not look healthy and there is no surrounding erythema. No drainage. There is still a rash on the left leg that almost looks vasculitic  however it is clearly limited to the top of where the wrap would be. 04/05/15; on the right required a surgical debridement of surface eschar and necrotic subcutaneous tissue. I did not debridement the area on the left. These continue to be large open wounds that are not changing that much. We were successful at one point in healing the area on the right, and at the same time the area on the left was roughly half the size of current measurements. I think a lot of the deterioration has to do with the prolonged time the patient is on her feet  at work 04/19/15 I attempted-like surface debridement bilaterally she does not tolerate this. She tells me that she was in allergic care yesterday with extreme pain over her left lateral malleolus/ankle and was told that she has an "sprain" 05/03/15; large bilateral venous insufficiency wounds over the medial malleolus/medial aspect of her ankles. She complains of copious amounts of drainage and his usual large amounts of pain. There is some increasing erythema around the wound on the right extending into the medial aspect of her foot to. historically she came in with these wounds the right one healed and the left one came down to roughly half its current size however the right one is reopened and the left is expanded. 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 05/10/15 large bilateral venous insufficiency wounds. There is less adherence surface left however the surface culture that I did last week grew pseudomonas therefore bilateral selective debridement score necessary. There is surrounding erythema. The patient describes severe bilateral drainage and a lot of pain in the left ankle. Apparently her podiatrist was were ready to do a cortisone shot 05/17/15; the patient complains of pain and again copious amounts of drainage. 05/24/15; we used Iodo flex last week. Patient notes considerable improvement in wound  drainage. Only needed to change this once. 05/31/15; we continued Iodoflex; the base of these large wounds bilaterally is not too bad but there is probably likely a significant bioburden here. I would like to debridement just doesn't tolerate it. 06/06/14 I would like to continue the Iodoflex although she still hasn't managed to obtain supplies. She has bilateral medial malleoli or large wounds which are mostly superficial. Both of them are covered circumferentially with some nonviable fibrinous slough although she tolerates debridement very poorly. She apparently has an appointment for an ablation on the right leg by interventional radiology. 06/14/15; the patient arrives with the wounds and static condition. We attempted a debridement although she does not do well with this secondary to pain. I 07/05/15; wounds are not much smaller however there appears to be a cleaner granulating base. The left has tight fibrinous slough greater than the right. Debridement is tolerated poorly due to pain. Iodoflex is done more for these wounds in any of the multitude of different dressings I have tried on the left 1 and then subsequently the right. 07/12/15; no change in the condition of this wound. I am able to do an aggressive debridement on the right but not the left. She simply cannot tolerate it. We have been using Iodoflex which helps somewhat. It is worthwhile remembering that at one point we healed the right medial ankle wound and the left was about 25% of the current circumference. We have suggested returning to vascular surgery for review of possible further ablations for one reason or another she has not been able to do this. 07/26/15 no major change in the condition of either wound on her medial ankle. I did not attempt to debridement of these. She has been aggressively scrubbing these while she is in the shower at home. She has her supply of Iodoflex which seems to have done more for these wounds then  anything I have put on recently. 08/09/15 wound area appears larger although not verified by measurements. Using Iodoflex 09/05/2015 -- she was here for avisit today but had significant problems with the wound and I was asked to see her for a physician opinion. I have summarize  that this lady has had surgery on her left lower extremity about 10 years ago where the possible veins stripping was done. She has had an opinion from interventional radiology around November 2016 where no further sclerotherapy was ordered. The patient works 12 hours a day and stands on a concrete floor with work boots and is unable to get the proper compression she requires and cannot elevate her limbs appropriately at any given time. She has recently grown Pseudomonas from her wound culture but has not started her ciprofloxacin which was called in for her. 09/13/15 this continues to be a difficult situation for this patient. At one point I had this wound down to a 1.5 x 1.5" wound on her left leg. This is deteriorated and the right leg has reopened. She now has substantial wounds on her medial calcaneus, malleoli and into her lower leg. One on the left has surface eschar but these are far too painful for me to debridement here. She has a vascular surgery appointment next week to see if anything can be done to help here. I think she has had previous ablations several years ago at [facility]. She has no major edema. She tells me that she did not get product last time [facility]] and went for several days without it. She continues to work in work boots 12 hours a day. She cannot get compression/4-layer under her work boots. 09/20/15 no major change. Periwound edema control was not very good. Her point with pain and vascular is next Wednesday the 25th 09/28/15; the patient is seen vascular surgery and is apparently scheduled for repeat duplex ultrasounds of her bilateral lower legs next week. 10/05/15; the patient was seen by  Dr. Doren Custard of vascular surgery. He feels that she should have arterial insufficiency excluded as cause/contributed to her nonhealing stage she is therefore booked for an arteriogram. She has apparently monophasic signals in the dorsalis pedis pulses. She also of course has known severe chronic venous insufficiency with previous procedures as noted previously. I had another long discussion with the patient today about her continuing to work 12 hour shifts. I've written her out for 2 months area had concerns about this as her work location is currently undergoing significant turmoil and this may lead to her termination. She is aware of this however I agree with her that she simply cannot continue to stand for 12 hours multiple days a week with the substantial wound areas she has. 10/19/15; the Dr. Doren Custard appointment was largely for an arteriogram which was normal. She does not have an arterial issue. He didn't make a comment about her chronic venous insufficiency for which she has had previous ablations. Presumably it was not felt that anything additional could be done. The patient is now out of work as I prescribed 2 weeks ago. Her wounds look somewhat less aggravated presumably because of this. I felt I would give debridement another try today 10/25/15; no major change in this patient's wounds. We are struggling to get her product that she can afford into her own home through her insurance. 11/01/15; no major change in the patient's wounds. I have been using silver alginate as the most affordable product. I spoke to Dr. Marla Roe last week with her requested take her to the OR for surgical debridement and placement of ACEL. Dr. Marla Roe told me that she would be willing to do this however [facility] will not cover this, fortunately the patient has [place] healthcare of some variant 11/08/15;  no major change in the patient's wounds. She has been completely nonviable surface that this but is  in too much pain with any attempted debridement are clinic. I have arranged for her to see Dr. Marla Roe ham of plastic surgery and this appointment is on Monday. I am hopeful that they will take her to the OR for debridement, possible ACEL ultimately possible skin graft 11/22/15 no major change in the patient's wounds over her bilateral medial calcaneus medial malleolus into the lower legs. Surface on these does not look too bad however on the left there is surrounding erythema and tenderness. This may be cellulitis or could him sleepy tinea. 11/29/15; no major changes in the patient's wounds over her bilateral medial malleolus. There is no infection here and I don't think any additional antibiotics are necessary. There is now plan to move forward. She sees Dr. Marla Roe in a week's time for preparation for operative debridement and ACEL placement I believe on 7/12. She then has a follow-up appointment with Dr. Marla Roe on 7/21 12/28/15; the patient returns today having been taken to the [facility] by Dr. Marla Roe 12/12/15 she underwent debridement, intraoperative cultures [which were negative]. She had placement of a wound VAC. Parent really ACEL was not available to be placed. The wound VAC foam apparently adhered to the wound since then she's been using silver alginate, Xeroform under Ace wraps. She still says there is a lot of drainage and a lot of pain 01/31/16; this is a patient I see monthly. I had referred her to Dr. Marla Roe him of plastic surgery for large wounds on her bilateral medial ankles. She has been to the OR twice once in early July and once in early August. She tells me over the last 3 weeks she has been using the wound VAC with ACEL underneath it. On the right we've simply been using silver alginate. Under Kerlix Coban wraps. 02/28/16; this is a patient I'm currently seeing monthly. She is gone on to have a skin graft over her large venous insufficiency ulcer on the left medial  ankle. This was done by Dr. Marla Roe him. The patient is a bit perturbed about why she didn't have one on her right medial ankle wound. She has been using silver alginate to this. 03/06/16; I received a phone call from her plastic surgery Dr. Marla Roe. She expressed some concern about the viability of the skin graft she did on the left medial ankle wound. Asked me to place Endoform on this. She told me she is not planning to do a subsequent skin graft on the right as the left one did not take very well. I had placed Hydrofera Blue on the right 03/13/16; continue to have a reasonably healthy wound on the right medial ankle. Down to 3 mm in terms of size. There is epithelialization here. The area on the left medial ankle is her skin graft site. I suppose the last week this looks somewhat better. She has an open area inferiorly however in the center there appears to be some viable tissue. There is a lot of surface callus and eschar that will eventually need to come off however none of this looked to be infected. Patient states that the is able to keep the dressing on for several days which is an improvement. 03/20/16 no major change in the circumference of either wound however on the left side the patient was at Dr. Eusebio Friendly office and they did a debridement of left wound. 50% of the wound seems to  be epithelialized. I been using Endoform on the left Hydrofera Blue in the right 03/27/16; she arrives today with her wound is not looking as healthy as they did last week. The area on the right clearly has an adherent surface to this a very similar surface on the left. Unfortunately for this patient this is all too familiar problem. Clearly the Endoform is not working and will need to change that today that has some potential to help this surface. She does not tolerate debridement in this clinic very well. She is changing the dressing wants 04/03/16; patient arrives with the wounds looking somewhat  better especially on the right. Dr. Migdalia Dk change the dressing to silver alginate when she saw her on Monday and also sold her some compression socks. The usefulness of the latter is really not clear and woman with severely draining wounds. 04/10/16; the patient is doing a bit of an experiment wearing the compression stockings that Dr. Migdalia Dk provided her to her left leg and the out of legs based dressings that we provided to the right. 05/01/16; the patient is continuing to wear compression stockings Dr. Migdalia Dk provided her on the left that are apparently silver impregnated. She has been using Iodoflex to the right leg wound. Still a moderate amount of drainage, when she leaves here the wraps only last for 4 days. She has to change the stocking on the left leg every night 05/15/16; she is now using compression stockings bilaterally provided by Dr. Marla Roe. She is wearing a nonadherent layer over the wounds so really I don't think there is anything specific being done to this now. She has some reduction on the left wound. The right is stable. I think all healing here is being done without a specific dressing 06/09/16; patient arrives here today with not much change in the wound certainly in diameter to large circular wounds over the medial aspect of her ankle bilaterally. Under the light of these services are certainly not viable for healing. There is no evidence of surrounding infection. She is wearing compression stockings with some sort of silver impregnation as prescribed by Dr. Marla Roe. She has a follow-up with her tomorrow. 06/30/16; no major change in the size or condition of her wounds. These are still probably covered with a nonviable surface. She is using only her purchase stockings. She did see Dr. Marla Roe who seemed to want to apply Dakin's solution to this I'm not extreme short what value this would be. I would suggest Iodoflex which she still has at home. 07/28/16; I follow  Mrs. Yeagle episodically along with Dr. Marla Roe. 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 She has been applying a topical collagen ointment to both wounds with Adaptic. I don't think Dr. Marla Roe is planning to take her back to the OR. 08/19/16; I follow Mrs. Jeneen Rinks on a monthly basis along with Dr. Marla Roe of plastic surgery. 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 I been following her for a number of years. At one point I was able to get the right medial malleolus wound to heal and had the left medial malleolus down to about half its current size however and I had to send her to plastic surgery for an operative debridement. Since then things have been stable to slightly improve the area on the right is slightly better one in the left about the same although there is much less  adherent surface than I'm used to with this patient. She is using some form of liquid collagen gel that Dr. Marla Roe provided a Kerlix cover with the patient's own pressure stockings. She tells me that she has extreme pain in both ankles and along the lateral aspect of both feet. She has been unable to work for some period of time. She is telling me she is retiring at the beginning of April. She sees Dr. Doran Durand of orthopedics next week 09/22/16; patient has not seen Dr. Marla Roe since the last time she is here. I'm not really sure what she is using to the wounds other than bits and pieces of think she had left over including most recently Hydrofera Blue. She is using juxtalite stockings. She is having difficulty with her husband's recent illness "stroke". She is having to transport him to various doctors appointments. Dr. Marla Roe left her the option of a repeat debridement with ACEL however she has not been able to get the time to follow-up on this. She continues to have a fair  amount of drainage out of these wounds with certainly precludes leaving dressings on all week 10/13/16; patient has not seen Dr. Marla Roe since she was last in our clinic. I'm not really sure what she is doing with the wounds, we did try to get her [facility] and I think she is actually using this most of the time. Because of drainage she states she has to change this every second day although this is an improvement from what she used to do. She went to see Dr. Doran Durand who did not think she had a muscular issue with regards to her feet, he referred her to a neurologist and I think the appointment is sometime in June. I changed her back to Iodoflex which she has used in the past but not recently. 11/03/16; the patient has been using Iodoflex although she ran out of this. Still claims that there is a lot of drainage although the wound does not look like this. No surrounding erythema. She has not been back to see Dr. Marla Roe 11/24/16; the patient has been using Iodoflex again but she ran out of it 2 or 3 days ago. There is no major change in the condition of either one of these wounds in fact they are larger and covered in a thick adherent surface slough/nonviable tissue especially on the left. She does not tolerate mechanical debridement in our clinic. Going back to see Dr. Marla Roe of plastic surgery for an operative debridement would seem reasonable. 12/15/16; the patient has not been back to see Dr. Marla Roe. She is been dealing with a series of illnesses and her husband which of monopolized her time. She is been using Sorbact which we largely supplied. She states the drainage is bad enough that it maximum she can go 2-3 days without changing the dressing 01/12/2017 -- the patient has not been back for about 4 weeks and has not seen Dr. Marla Roe not does she have any appointment pending. 01/23/17; patient has not seen Dr. Marla Roe even though I suggested this previously. She is using  Santyl that was suggested last week by Dr. Con Memos this Cost her $16 through her insurance which is indeed surprising 02/12/17; continuing Santyl and the patient is changing this daily. A lot of drainage. She has not been back to see plastic surgery she is using an Ace wrap. Our intake nurse suggested wrap around stockings which would make a good reasonable alternative 02/26/17; patient is been using Santyl  and changing this daily due to drainage. She has not been to see plastic surgery she uses in April Ace wrap to control the edema. She did obtain extremitease stockings but stated that the edema in her leg was to big for these 03/20/17; patient is using Santyl and Anasept. Surfaces looked better today the area on the right is actually measuring a little smaller. She has states she has a lot of pain in her feet and ankles and is asking for a consult to pain control which I'll try to help her with through our case manager. 04/10/17; the patient arrives with better-looking wound surfaces and is slightly smaller wound on the left she is using a combination of Santyl and Anasept. She has an appointment or at least as started in the pain control center associated with [facility] 05/14/17; this is a patient who I followed for a prolonged period of time. 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 At one point I had this down to a much smaller wound on the left however these reopened and we've never been able to get these to heal. She has been using Santyl and Anasept gel although 2 weeks ago she ran out of the Anasept gel. She has a stable appearance of the wound. She is going to the wound care clinic at [facility]. They wanted do a nerve block/spinal block although she tells me she is reluctant to go forward with that. 05/21/17; this is a patient I have followed for many years. 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 Chronic pain and  deformity in her ankles as well. She is been to see plastic surgery as well as orthopedics. Using PolyMem AG most recently/Kerramax/ABDs and 2 layer compression. She has managed to keep this on and she is coming in for a nurse check to change the dressing on Tuesdays, we see her on Fridays 06/05/17; really quite a good looking surface and the area especially on the right medial has contracted in terms of dimensions. Well granulated healthy-looking tissue on both sides. Even with an open curet there is nothing that even feels abnormal here. This is as good as I've seen this in quite some time. We have been using PolyMem AG and bringing her in for a nurse check 06/12/17; really quite good surface on both of these wounds. The right medial has contracted a bit left is not. We've been using PolyMem and AG and she is coming in for a nurse visit 06/19/17; we have been using PolyMem AG and bringing her in for a nurse check. Dimensions of her wounds are not better but the surfaces looked better bilaterally. She complained of bleeding last night and the left wound and increasing pain bilaterally. She states her wound pain is more neuropathic than just the wounds. There was some suggestion that this was radicular from her pain management doctor in talking to her it is really difficult to sort this out. 06/26/17; using PolyMem and AG and bringing her in for a nurse check as All of this and reasonably stable condition. Certainly not improved. The dimensions on the lateral part of the right leg look better but not really measuring better. The medial aspect on the left is about the same. 07/03/16; we have been using PolyMen AG and bringing her in for a nurse check to change the dressings as the wounds have drainage which precludes once weekly changing. We are using all secondary absorptive dressings.our intake nurse is  brought up the idea of using a wound VAC/snap VAC on the wound to help with the drainage to see if  this would result in some contraction. This is not a bad idea. The area on the right medial is actually looking smaller. Both wounds have a reasonable-looking surface. There is no evidence of cellulitis. The edema is well controlled 07/10/17; the patient was denied for a snap VAC by her insurance. The major issue with these wounds continues to be drainage. We are using wicked PolyMem AG and she is coming in for a nurse visit to change this. The wounds are stable to slightly improved. The surface looks vibrant and the area on the right certainly has shrunk in size but very slowly 07/17/17; the patient still has large wounds on her bilateral medial malleoli. Surface of both of these wounds looks better. The dimensions seem to come and go but no consistent improvement. There is no epithelialization. We do not have options for advanced treatment products due to insurance issues. They did not approve of the wound VAC to help control the drainage. More recently we've been using PolyMem and AG wicked to allow drainage through. We have been bringing her in for a nurse visit to change this. We do not have a lot of options for wound care products and the home again due to insurance issues 07/24/17; the patient's wound actually looks somewhat better today. No drainage measurements are smaller still healthy-looking surface. We used silver collagen under PolyMen started last week. We have been bringing her in for a dressing change 07/31/17; patient's wound surface continued to look better and I think there is visible change in the dimensions of the wound on the right. Rims of epithelialization. We have been using silver collagen under PolyMen and bringing her in for a dressing change. There appears to be less drainage although she is still in need of the dressing change 08/07/17. Patient's wound surface continues to look better on both sides and the area on the right is definitely smaller. We have been using silver  collagen and PolyMen. She feels that the drainage has been it has been better. I asked her about her vascular status. She went to see Dr. Aleda Grana at [facility] and had some form of ablation. I don't have much detail on this. I haven't my notes from 2016 that she was not a candidate for any further ablation but I don't have any more information on this. We had referred her to vein and vascular I don't think she ever went. He does not have a history of PAD although I don't have any information on this either. We don't even have ABIs in our record 08/14/17; we've been using silver collagen and PolyMen cover. And putting the patient and compression. She we are bringing her in as a nurse visit to change this because ofarge amount of drainage. We didn't the ABIs in clinic today since they had been done in many moons 1.2 bilaterally. She has been to see vein and vascular however this was at [facility] and she had ablation although I really don't have any information on this all seemed biking get a report. She is also been operatively debrided by plastic surgery and had a cell placed probably 8-12 months ago. This didn't have a major effect. We've been making some gains with current dressings 08/19/17-She is here in follow-up evaluation for bilateral medial malleoli ulcers. She continues to tolerate debridement very poorly. We will continue with recently changed  topical treatment; if no significant improvement may consider switching to Iodosorb/Iodoflex. 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 She has been using silver collagen and PolyMem. I believe she has been used and tried on Iodoflex before. 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 These have  been very refractory over time. We have been using silver collagen and PolyMen. She is coming in once a week for a doctor's and once a week for nurse visits. We are actually making some progress 09/18/17; the patient's wounds are smaller especially on the right medial. She arrives today to upset to consider even washing these off with Anasept which I think is been part of the reason this is been closing. We've been using collagen covered in PolyMen otherwise. It is noted that she has a small area of folliculitis on the right medial calf that. As we are wrapping her legs I'll give her a short course of doxycycline to make sure this doesn't amount to anything. She is a long list of complaints today including imbalance, shortness of breath on exertion, inversion of her left ankle. With regards to the latter complaints she is been to see orthopedics and they offered her a tendon release surgery I believe but wanted her wounds to be closed first. I have recommended she go see her primary physician with regards to everything else. 09/25/17; patient's wounds are about the same size. We have made some progress bilaterally although not in recent weeks. She will not allow me T wash these wounds with Anasept even if she is doing her cell. Wheeze we've been using collagen covered in PolyMen. Last week she had a small area of folliculitis this is now opened into a small wound. She completed 5 days of trimethoprim sulfamethoxazole 10/02/17; unfortunately the area on her left medial ankle is worse with a larger wound area towards the Achilles. The patient complains of a lot of pain. She will not allow debridement although visually I don't think there is anything to debridement in any case. We have been using silver collagen and PolyMen for several months now. Initially we are making some progress although I'm not really seeing that today. We will move back to [facility]. His admittedly this is a bit of a repeat  however I'm hoping that his situation is different now. The patient tells me she had her leg on the left give out on her yesterday this is process some pain. 10/09/17; the patient is seen twice a week largely because of drainage issues coming out of the chronic medial bimalleolar wounds that are chronic. Last week the dimensions of the one on the left looks a little larger I changed her to [facility]. She comes in today with a history of terrible pain in the bilateral wound areas. She will not allow debridement. She will not even allow a tissue culture. There is no surrounding erythema no no evidence of cellulitis. We have been putting her Kerlix Coban man. She will not allow more aggressive compression as there was a suggestion to put her in 3 layer wraps. 10/16/17; large wounds on her bilateral medial malleoli. These are chronic. Not much change from last week. The surface looks have healthy but absolutely no epithelialization. A lot of pain little less so of drainage. She  will not allow debridement or even washing these off in the vigorous fashion with Anasept. 10/23/17; large wounds on her bilateral malleoli which are chronic. Some improvement in terms of size perhaps on the right since last time I saw these. She states that after we increased the 3 layer compression there was some bleeding, when she came in for a nurse visit she did not want 3 layer compression put back on about our nurse managed to convince her. She has known chronic venous visit issues and I'm hoping to get her to tolerate the 3 layer compression. using Hydrofera Blue 10/30/17; absolutely no change in the condition of either wound although we've had some improvement in dimensions on the right.. Attempted to put her in 3 layer compression she didn't tolerated she is back in 2 layer compression. We've been using Hydrofera Blue We looked over her past records. She had venous reflux studies in November 2016. There was no  evidence of deep venous reflux on the right. Superficial vein did not show the greater saphenous vein at think this is been previously ablated the small saphenous vein was within normal limits. The left deep venous system showed no DVT the vessels were positive for deep venous reflux in the posterior tibial veins at the ankle. The greater saphenous vein was surgically absent small saphenous vein was within normal limits. She went to vein and vascular at [facility]. I believe she had an ablation on the left greater saphenous vein. I'll update her reflux studies perhaps ever reviewed by vein and vascular. We've made absolutely no progress in these wounds. Will also try to read and TheraSkins through her insurance 11/06/17; W the patient apparently has a 2 week follow-up with vein and vascular I like him to review the whole issue with regards to her previous vascular workup by Dr. Aleda Grana. We've really made no progress on these wounds in many months. She arrives today with less viable looking surface on the left medial ankle wound. This was apparently looking about the same on Tuesday when she was here for nurse visit. 11/13/17; deep tissue culture I did last time of the left lower leg showed multiple organisms without any predominating. In particular no Staphylococcus or group A strep were isolated. We sent her for venous reflux studies. She's had a previous left greater saphenous vein stripping and I think sclerotherapy of the right greater saphenous vein. She didn't really look at the lesser saphenous vein this both wounds are on the medial aspect. She has reflux in the common femoral vein and popliteal vein and an accessory vein on the right and the common femoral vein and popliteal vein on the left. I'm going to have her go to see vein and vascular just the look over things and see if anything else beside aggressive compression is indicated here. We have not been able to make any progress  on these wounds in spite of the fact that the surface of the wounds is never look too bad. 11/20/17; no major change in the condition of the wounds. Patient reports a large amount of drainage. She has a lot of complaints of pain although enlisting her today I wonder if some of this at least his neuropathic rather than secondary to her wounds. She has an appointment with vein and vascular on 12/30/17. The refractory nature of these wounds in my mind at least need vein and vascular to look over the wounds the recent reflux studies we did and her history to see  if anything further can be done here. I also note her gait is deteriorated quite a bit. 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 She has a bilateral Trendelenburg gait. I wonder if this is neuropathic or perhaps multilevel radicular. 11/27/17; her wounds actually looks slightly better. Healthy-looking granulation tissue a scant amount of epithelialization. [place] healthcare will not pay for [employer]. They will play for tri layer Oasis and Dermagraft. This is not a diabetic ulcer. We'll try for the tri layer Oasis. She still complains of some drainage. She has a vein and vascular appointment on 12/30/17 12/04/17; the wounds visually look quite good. Healthy-looking granulation with some degree of epithelialization. We are still waiting for response to our request for trial to try layer Oasis. Her appointment with vascular to review venous and arterial issues isn't sold the end of July 7/31. Not allow debridement or even vigorous cleansing of the wound surface. 12/18/17; slightly smaller especially on the right. 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 We've been using Hydrofera Blue. We still are looking into triple layer Oasis through her insurance 01/08/18 on evaluation today patient's wound actually appears to be showing signs of good improvement at this point in time. She has been tolerating the dressing  changes without complication. Fortunately there does not appear to be any evidence of infection at this point in time. We have been utilizing silver nitrate which does seem to be of benefit for her which is also good news. Overall I'm very happy with how things seem to be both regards appearance as well as measurement. Patient did see Dr. Bridgett Larsson for evaluation on 12/30/17. In his assessment he felt that stripping would not likely add much more than chronic compression to the patient's healing process. His recommendation was to follow-up in three months with Dr. Doren Custard if she hasn't healed in order to consider referral back to you and see vascular where she previously was in a trial and was able to get her wound to heal. I'll be see what she feels she when you staying compression and he reiterated this as well. 01/13/18 on evaluation today patient appears to actually be doing very well in regard to her bilateral medial malleolus ulcers. She seems to have tolerated the chemical cauterization with silver nitrate last week she did have some pain through that evening but fortunately states that I'll be see since it seems to be doing better she is overall pleased with the progress. 01/21/18; really quite a remarkable improvement since I've last seen these wounds. We started using silver nitrate specially on the islands of hyper granulation which for some reason her around the wound circumference. This is really done quite nicely. Primary dressing Hydrofera Blue under 4 layer compression. She seems to be able to hold out without a nurse rewrap. Follow-up in 1 week 01/28/18; we've continued the hydrofera blue but continued with chemical cauterization to the wound area that we started about a month ago for irregular hyper granulation. She is made almost stunning improvement in the overall wound dimensions. I was not really expecting this degree of improvement in these chronic wounds 02/05/18; we continue with  Hydrofera Bluebut of also continued the aggressive chemical cauterization with silver nitrate. We made nice progress with the right greater than left wound. 02/12/18. We continued with Hydrofera Blue after aggressive chemical cauterization with silver nitrate. We appear to be making nice progress with both wound areas 02/19/2018; we continue with [facility] after washing the wounds  vigorously with Anasept spray and chemical cauterization with silver nitrate. We are making excellent progress. The area on the right's just about closed 02/26/2018. The area on the left medial ankle had too much necrotic debris today. I used a #5 curette we are able to get most of the soft. I continued with the silver nitrate to the much smaller wound on the right medial ankle she had a new area on her right lower pretibial area which she says was due to a role in her compression 03/05/2018; both wound areas look healthy. Not much change in dimensions from last week. I continue to use silver nitrate and Hydrofera Blue. The patient saw Dr. Doren Custard of vein and vascular. He felt she had venous stasis ulcers. He felt based on her previous arteriogram she should have adequate circulation for healing. Also she has deep venous reflux but really no significant correctable superficial venous reflux at this time. He felt we should continue with conservative management including leg elevation and compression 04/02/2018; since we last saw this woman about a month ago she had a fall apparently suffered a pelvic fracture. I did not look up the x-ray. Nevertheless because of pain she literally was bedbound for 2 weeks and had home health coming out to change the dressing. Somewhat predictably this is resulted in considerable improvement in both wound areas. The right is just about closed on the medial malleolus and the left is about half the size. 04/16/2018; both her wounds continue to go down in size. Using Hydrofera Blue. 05/07/18;  both her wounds appeared to be improving especially on the right where it is almost closed. We are using Hydrofera Blue 05/14/2018; slightly worse this week with larger wounds. Surface on the left medial not quite as good. We have been using Hydrofera Blue 05/21/18; again the wounds are slightly larger. Left medial malleolus slightly larger with eschar around the circumference. We have been using Hydrofera Blue undergoing a wraps for a prolonged period of time. This got a lot better when she was more recumbent due to a fall and a back injury. I change the primary dressing the silver alginate today. She did not tolerate a 4 layer compression previously although I may need to bring this up with her next time 05/28/2018; area on the left medial malleolus again is slightly larger with more drainage. Area on the right is roughly unchanged. She has a small area of folliculitis on the right medial just on the lower calf. This does not look ominous. 06/03/2018 left medial malleolus slightly smaller in a better looking surface. We used silver nitrate on this last time with silver alginate. The area on the right appears slightly smaller 1/10; left medial malleolus slightly smaller. Small open area on the right. We used silver nitrate and silver alginate as of 2 weeks ago. We continue with the wound and compression. These got a lot better when she was off her feet 1/17; right medial malleolus wound is smaller. The left may be slightly smaller. Both surfaces look somewhat better. 1/24; both wounds are slightly smaller. Using silver alginate under Unna boots 1/31; both wounds appear smaller in fact the area on the right medial is just about closed. Surface eschar. We have been using silver alginate under Unna boots. The patient is less active now spends let much less time on her feet and I think this is contributed to the general improvement in the wound condition 2/7; both wounds appear smaller. I was hopeful  the right  medial would be closed however there there is still the same small open area. Slight amount of surface eschar on the left the dimensions are smaller there is eschar but the wound edges appear to be free. We have been using silver alginate under Unna boot's 2/14; both wounds once again measure smaller. Circumferential eschar on the left medial. We have been using silver alginate under Unna boots with gradual improvement 2/21; the area on the right medial malleolus has healed. The area on the left is smaller. We have been using silver alginate and Unna boots. We can discharge wrapping the right leg she has 20/30 stockings at home she will need to protect the scar tissue in this area 2/28; the area on the right medial malleolus remains closed the patient has a compression stocking. The area on the left is smaller. We have been using silver alginate and Unna boots. 3/6 the area on the right medial ankle remains closed. Good edema control noted she is using her own compression stocking. The area on the left medial ankle is smaller. We have been managing this with silver alginate and Unna boots which we will continue today. 3/13; the area on the right medial ankle remains closed and I'm declaring it healed today. When necessary the left is about the same still a healthy-looking surface but no major change and wound area. No evidence of infection and using silver alginate under unna and generally making considerable improvement 3/27 the area on the right medial ankle remains closed the area on the left is about the same as last week. Certainly not any worse we have been using silver alginate under an Unna boot 4/3; the area on the right medial ankle remains closed per the patient. We did not look at this wound. The wound on the left medial ankle is about the same surface looks healthy we have been using silver alginate under an Unna boot 4/10; area on the right medial ankle remains closed per  the patient. We did not look at this wound. The wound on the left medial ankle is slightly larger. The patient complains that the [facility] caused burning pain all week. She also told us that she was a lot more active this week. Changed her back to silver alginate 4/17; right medial ankle still closed per the patient. Left medial ankle is slightly larger. Using silver alginate. She did not tolerate Hydrofera Blue on this area 4/24; right medial ankle remains closed we have not look at this. The left medial ankle continues to get larger today by about a centimeter. We have been using silver alginate under Unna boots. She complains about 4 layer compression as an alternative. She has been up on her feet working on her garden 5/8; right medial ankle remains closed we did not look at this. The left medial ankle has increased in size about 100%. We have been using silver alginate under Unna boots. She noted increased pain this week and was not surprised that the wound is deteriorated 5/15; no major change in SA however much less erythema ( one week of doxy ocellulitis). 5/22-63 year old female returns at 1 week to the clinic for left medial ankle wound for which we have been using silver alginate under 3 layer compression She was placed on DOXY at last visit - the wound is wider at this visit. She is in 3 layer compression 5/29; change to [facility] last week. I had given her empiric doxycycline 2 weeks ago for a week.  She is in 3 layer compression. She complains of a lot of pain and drainage on presentation today. 6/5; using Hydrofera Blue. I gave her doxycycline recently empirically for erythema and pain around the wound. Believe her cultures showed enterococcus which not would not have been well covered by doxycycline nevertheless the wound looks better and I don't feel specifically that the enterococcus needs to be covered. She has a new what looks like a wrap injury on her lateral left  ankle. 6/12; she is using Hydrofera Blue. She has a new area on the left anterior lower tibial area. This was a wrap injury last week. 6/19; the patient is using Hydrofera Blue. She arrived with marked inflammation and erythema around the wound and tenderness. 12/01/18 on evaluation today patient appears to be doing a little bit better based on what I'm hearing from the standpoint of lassos evaluation to this as far as the overall appearance of the wound is concerned. Then sometime substandard she typically sees Dr. Dellia Nims. Nonetheless overall very pleased with the progress that she's made up to this point. No fevers, chills, nausea, or vomiting noted at this time. 7/10; some improvement in the surface area. Aggressively debrided last week apparently. I went ahead with the debridement today although the patient does not tolerate this very well. We have been using Iodoflex. Still a fair amount of drainage 7/17; slightly smaller. Using Iodoflex. 7/24; no change from last week in terms of surface area. We have been using Iodoflex. Surface looks and continues to look somewhat better 7/31; surface area slightly smaller better looking surface. We have been using Iodoflex. This is under Unna boot compression 8/7-Patient presents at 1 week with Unna boot and Iodoflex, wound appears better 8/14-Patient presents at 1 week with Iodoflex, we use the Unna boot, wound appears to be stable better.Patient is getting Botox treatment for the inversion of the foot for tendon release, Next week 8/21; we are using Iodoflex. Unna boot. The wound is stable in terms of surface area. Under illumination there is some areas of the wound that appear to be either epithelialized or perhaps this is adherent slough at this point I was not really clear. It did not wipe off and I was reluctant to debride this today. 8/28; we are using Iodoflex in an Unna boot. Seems to be making good improvement. 9/4; using Iodoflex and wound  is slightly smaller. 9/18; we are using Iodoflex with topical silver nitrate when she is here. The wound continues to be smaller 10/2; patient missed her appointment last week due to GI issues. She left and Iodoflex based dressing on for 2 weeks. Wound is about the same size about the size of a dime on the left medial lower 10/9 we have been using Iodoflex on the medial left ankle wound. She has a new superficial probable wrap injury on the dorsal left ankle 10/16; we have been using Hydrofera Blue since last week. This is on the left medial ankle 10/23; we have been using Hydrofera Blue since 2 weeks ago. This is on the left medial ankle. Dimensions are better 11/6; using Hydrofera Blue. I think the wound is smaller but still not closed. Left medial ankle 11/13; we have been using Hydrofera Blue. Wound is certainly no smaller this week. Also the surface not as good. This is the remanent of a very large area on her left medial ankle. 11/20; using Sorbact since last week. Wound was about the same in terms of size although I was  disappointed about the surface debris 12/11; 3-week follow-up. Patient was on vacation. Wound is measuring slightly larger we have been using Sorbact. 12/18; wound is about the same size however surface looks better last week after debridement. We have been using Sorbact under compression Objective Constitutional Sitting or standing Blood Pressure is within target range for patient.. Pulse regular and within target range for patient.Marland Kitchen Respirations regular, non-labored and within target range.. Temperature is normal and within the target range for the patient.Marland Kitchen Appears in no distress. Vitals Time Taken: 10:34 AM, Height: 68 in, Weight: 127 lbs, BMI: 19.3, Temperature: 98.1 F, Pulse: 67 bpm, Respiratory Rate: 18 breaths/min, Blood Pressure: 116/48 mmHg. Eyes Conjunctivae clear. No discharge.no icterus. Respiratory work of breathing is normal. Cardiovascular Pedal  pulses are palpable on the left. Psychiatric appears at normal baseline. General Notes: Wound exam; left medial malleolus. Surface area is about the same better looking wound surface even under illumination. No debridement was felt to be necessary. Integumentary (Hair, Skin) No surrounding erythema suggesting infection. Wound #3 status is Open. Original cause of wound was Gradually Appeared. The wound is located on the Left,Medial Malleolus. The wound measures 2cm length x 1.5cm width x 0.1cm depth; 2.356cm^2 area and 0.236cm^3 volume. There is Fat Layer (Subcutaneous Tissue) Exposed exposed. There is no tunneling or undermining noted. There is a medium amount of serosanguineous drainage noted. The wound margin is distinct with the outline attached to the wound base. There is large (67-100%) red, pink granulation within the wound bed. There is a small (1-33%) amount of necrotic tissue within the wound bed including Adherent Slough. 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 There was a Haematologist Compression Therapy Procedure by Deon Pilling, RN. Post procedure Diagnosis Wound #3: Same as Pre-Procedure Plan Follow-up Appointments: Return Appointment in 2 weeks. Dressing Change Frequency: Wound #3 Left,Medial Malleolus: Do not change entire dressing for one week. - change dressing in one week Skin Barriers/Peri-Wound Care: Moisturizing lotion TCA Cream or Ointment - mixed with lotion Wound Cleansing: Clean wound with Wound Cleanser - clean with anasept with dressing changes May shower with protection. Primary Wound Dressing: Wound #3 Left,Medial Malleolus: Other: - Sorbact. Apply foam to shin for protection Secondary  Dressing: Wound #3 Left,Medial Malleolus: Dry Gauze Kerramax Edema Control: Unna Boot to Left Lower Extremity - no kerlix layer Avoid standing for long periods of time Elevate legs to the level of the heart or above for 30 minutes daily and/or when sitting, a frequency of: - throughout the day Support Garment 20-30 mm/Hg pressure to: - compression stocking right leg daily 1. I am continuing with the Sorbact 2 Unna boot to continue. Electronic Signature(s) Signed: 05/20/2019 6:01:02 PM By: Linton Ham MD Entered By: Linton Ham on 05/20/2019 17:59:11 -------------------------------------------------------------------------------- SuperBill Details Patient Name: Date of Service: [name] 05/20/2019 Medical Record [id number] Patient Account Number: [id number] Date of Birth/Sex: Treating RN: 1955/06/08 (63 y.o. 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 Facility Procedures CPT4 Code Description: [id number] (Facility Use Only) [phone] -  APPLY MULTLAY COMPRS LWR LT LEG Modifier: Quantity: 1 Physician Procedures CPT4 Code Description: [id number] - WC PHYS LEVEL 3 - EST PT ICD-10 Diagnosis Description [id number] Non-pressure chronic ulcer of left ankle with fat layer I83.212 Varicose veins of right lower extremity with both ulcer Modifier: exposed of calf and inf Quantity: 1 lammation Electronic Signature(s) Signed: 05/20/2019 6:01:02 PM By: Linton Ham MD Previous Signature: 05/20/2019 5:42:17 PM Version By: Kela Millin Entered By: Linton Ham on 05/20/2019 17:59:26

## 2019-05-24 NOTE — Progress Notes (Signed)
ANAB, VIVAR ([id number]) Visit Report for 05/20/2019 Arrival Information Details Patient Name: Date of Service: [name], [name] 05/20/2019 10:00 AM Medical Record [id number] Patient Account Number: [id number] Date of Birth/Sex: Treating RN: 08/06/1955 (63 y.o. [name] Primary Care Persephonie Hegwood: [name] Other Clinician: Referring Winfield Caba: Treating Ronn Smolinsky/Extender:Robson, Tawny Asal in Treatment: 336 Visit Information History Since Last Visit Cane Added or deleted any medications: No Patient Arrived: 10:31 Any new allergies or adverse reactions: No Arrival Time: alone Had a fall or experienced change in No Accompanied By: None activities of daily living that may affect Transfer Assistance: risk of falls: Patient Identification Verified: Yes Signs or symptoms of abuse/neglect since last No Secondary Verification Process Completed: Yes visito Patient Requires Transmission-Based No Hospitalized since last visit: No Precautions: Implantable device outside of the clinic excluding No Patient Has Alerts: No cellular tissue based products placed in the center since last visit: Has Dressing in Place as Prescribed: Yes Has Compression in Place as Prescribed: Yes Pain Present Now: Yes Electronic Signature(s) Signed: 05/24/2019 6:26:19 PM By: Levan Hurst RN, BSN Entered By: Levan Hurst on 05/20/2019 10:32:51 -------------------------------------------------------------------------------- Compression Therapy Details Patient Name: Date of Service: [name] 05/20/2019 10:00 AM Medical Record [id number] Patient Account Number: [id number] Date of Birth/Sex: Treating RN: 1955-06-09 (63 y.o. [name] Primary Care Mariah Harn: [name] Other Clinician: Referring Zharia Conrow: Treating Teddy Rebstock/Extender:Robson, Tawny Asal in Treatment: 336 Compression Therapy Performed for Wound Wound #3  Left,Medial Malleolus Assessment: Performed By: Clinician Deon Pilling, RN Compression Type: Rolena Infante Post Procedure Diagnosis Same as Pre-procedure Electronic Signature(s) Signed: 05/20/2019 5:42:17 PM By: Kela Millin Entered By: Kela Millin on 05/20/2019 12:01:41 -------------------------------------------------------------------------------- Encounter Discharge Information Details Patient Name: Date of Service: [name] 05/20/2019 10:00 AM Medical Record [id number] Patient Account Number: [id number] Date of Birth/Sex: Treating RN: 08-Nov-1955 (63 y.o. [name] Primary Care Horace Wishon: [name] Other Clinician: Referring Rena Hunke: Treating Lorik Guo/Extender:Robson, Tawny Asal in Treatment: 336 Encounter Discharge Information Items Discharge Condition: Stable Ambulatory Status: Cane Discharge Destination: Home Transportation: Private Auto Accompanied By: self Schedule Follow-up Appointment: Yes Clinical Summary of Care: Electronic Signature(s) Signed: 05/20/2019 5:50:42 PM By: Deon Pilling Entered By: Deon Pilling on 05/20/2019 12:18:11 -------------------------------------------------------------------------------- Lower Extremity Assessment Details Patient Name: Date of Service: [name], [name] 05/20/2019 10:00 AM Medical Record [id number] Patient Account Number: [id number] Date of Birth/Sex: Treating RN: [birth year] (64 y.o. [name] Primary Care Kennesha Brewbaker: [name] Other Clinician: Referring Dezmond Downie: Treating Gwendalynn Eckstrom/Extender:Robson, Velva Harman, Essie Christine in Treatment: 336 Edema Assessment Assessed: [Left: No] [Right: No] Edema: [Left: N] [Right: o] Calf Left: Right: Point of Measurement: 33 cm From Medial Instep 31 cm cm Ankle Left: Right: Point of Measurement: 12 cm From Medial Instep 21 cm cm Vascular Assessment Pulses: Dorsalis Pedis Palpable: [Left:Yes] Electronic  Signature(s) Signed: 05/24/2019 6:26:19 PM By: Levan Hurst RN, BSN Entered By: Levan Hurst on 05/20/2019 10:42:05 -------------------------------------------------------------------------------- Multi Wound Chart Details Patient Name: Date of Service: [name] 05/20/2019 10:00 AM Medical Record [id number] Patient Account Number: [id number] Date of Birth/Sex: Treating RN: 1955-09-07 (63 y.o. F) Primary Care Carliss Quast: [name] Other Clinician: Referring Josely Moffat: Treating Keelan Tripodi/Extender:Robson, Velva Harman, Essie Christine in Treatment: 336 Vital Signs Height(in): 68 Pulse(bpm): 67 Weight(lbs): 127 Blood Pressure(mmHg): 116/48 Body Mass Index(BMI): 19 Temperature(F): 98.1 Respiratory 18 Rate(breaths/min): Photos: [3:No Photos] [N/A:N/A] Wound Location: [3:Left Malleolus - Medial] [N/A:N/A] Wounding Event: [3:Gradually Appeared] [N/A:N/A] Primary Etiology: [3:Venous Leg Ulcer] [N/A:N/A] Comorbid History: [3:Congestive Heart Failure, Peripheral Vascular  Disease, Congestive Heart Failure, End Stage Renal Disease, Tobacco Use, Chronic Obstructive Pulmonary Disease (COPD), Type 1 Diabetes] [N/A:N/A] Date Acquired: [3:11/15/2012] [N/A:N/A] Weeks of Treatment: [3:336] [N/A:N/A] Wound Status: [3:Open] [N/A:N/A] Measurements L x W x D 2x1.5x0.1 [N/A:N/A] (cm) Area (cm) : [3:2.356] [N/A:N/A] Volume (cm) : [3:0.236] [N/A:N/A] % Reduction in Area: [3:25.00%] [N/A:N/A] % Reduction in Volume: [3:62.40%] [N/A:N/A] Classification: [3:Full Thickness Without Exposed Support Structures] [N/A:N/A] Exudate Amount: [3:Medium] [N/A:N/A] Exudate Type: [3:Serosanguineous] [N/A:N/A] Exudate Color: [3:red, brown] [N/A:N/A] Wound Margin: [3:Distinct, outline attached N/A] Granulation Amount: [3:Large (67-100%)] [N/A:N/A] Granulation Quality: [3:Red, Pink] [N/A:N/A] Necrotic Amount: [3:Small (1-33%)] [N/A:N/A] Exposed Structures: [3:Fat Layer (Subcutaneous N/A Tissue)  Exposed: Yes Fascia: No Tendon: No Muscle: No Joint: No Bone: No] Epithelialization: [3:Small (1-33%) Compression Therapy] [N/A:N/A N/A] Treatment Notes Wound #3 (Left, Medial Malleolus) 1. Cleanse With Wound Cleanser Soap and water 2. Periwound Care Barrier cream TCA Ointment 3. Primary Dressing Applied Other primary dressing (specifiy in notes) 4. Secondary Dressing Dry Gauze Kerramax/Xtrasorb 5. Secured With Tape 6. Support Layer Teacher, English as a foreign language Boot Notes [place]. no kerlix. primary dressing sorbact. foam to anterior lower leg for protection. Electronic Signature(s) Signed: 05/20/2019 6:01:02 PM By: Linton Ham MD Entered By: Linton Ham on 05/20/2019 17:57:02 -------------------------------------------------------------------------------- Multi-Disciplinary Care Plan Details Patient Name: Date of Service: [name] 05/20/2019 10:00 AM Medical Record [id number] Patient Account Number: [id number] Date of Birth/Sex: Treating RN: 1956-05-09 (63 y.o. [name] Primary Care Sherlock Nancarrow: [name] Other Clinician: Referring Jeane Cashatt: Treating Kirklin Mcduffee/Extender:Robson, Tawny Asal in Treatment: 336 Active Inactive Venous Leg Ulcer Nursing Diagnoses: Actual venous Insuffiency (use after diagnosis is confirmed) Goals: Patient will maintain optimal edema control Date Initiated: 09/04/2017 Target Resolution Date: 06/10/2019 Goal Status: Active Patient/caregiver will verbalize understanding of disease process and disease management Date Initiated: 08/03/2014 Date Inactivated: 08/28/2017 Target Resolution Date: 08/29/2017 Goal Status: Met Interventions: Compression as ordered Treatment Activities: Therapeutic compression applied : 09/21/2015 Notes: Wound/Skin Impairment Nursing Diagnoses: Impaired tissue integrity Knowledge deficit related to ulceration/compromised skin integrity Goals: Patient will have a decrease in  wound volume by X% from date: (specify in notes) Date Initiated: 04/05/2015 Date Inactivated: 02/12/2017 Target Resolution Date: 09/01/2016 Unmet Reason: chronic, Goal Status: Unmet unable to tolerate stronger compression Patient/caregiver will verbalize understanding of skin care regimen Date Initiated: 04/05/2015 Target Resolution Date: 06/10/2019 Goal Status: Active Interventions: Assess patient/caregiver ability to obtain necessary supplies Assess patient/caregiver ability to perform ulcer/skin care regimen upon admission and as needed Assess ulceration(s) every visit Provide education on ulcer and skin care Notes: Electronic Signature(s) Signed: 05/20/2019 5:42:17 PM By: Kela Millin Entered By: Kela Millin on 05/20/2019 10:59:47 -------------------------------------------------------------------------------- Pain Assessment Details Patient Name: Date of Service: [name], [name] 05/20/2019 10:00 AM Medical Record [id number] Patient Account Number: [id number] Date of Birth/Sex: Treating RN: Oct 27, 1955 (63 y.o. [name] Primary Care Marika Mahaffy: [name] Other Clinician: Referring Paulita Licklider: Treating Evalisse Prajapati/Extender:Robson, Tawny Asal in Treatment: 336 Active Problems Location of Pain Severity and Description of Pain Patient Has Paino No Site Locations Pain Management and Medication Current Pain Management: Electronic Signature(s) Signed: 05/24/2019 6:26:19 PM By: Levan Hurst RN, BSN Entered By: Levan Hurst on 05/20/2019 10:35:22 -------------------------------------------------------------------------------- Patient/Caregiver Education Details Patient Name: Date of Service: [name] 12/18/2020andnbsp10:00 AM Medical Record [id number] Patient Account Number: [id number] Date of Birth/Gender: Treating RN: [birth year] (63 y.o. [name] Primary Care Physician: [name] Other  Clinician: Referring Physician: Treating Physician/Extender:Robson, Tawny Asal in Treatment: 336 Education Assessment Education Provided To: Patient  Education Topics Provided Wound/Skin Impairment: Methods: Explain/Verbal Responses: State content correctly Electronic Signature(s) Signed: 05/20/2019 5:42:17 PM By: Kela Millin Entered By: Kela Millin on 05/20/2019 11:00:03 -------------------------------------------------------------------------------- Wound Assessment Details Patient Name: Date of Service: [name], [name] 05/20/2019 10:00 AM Medical Record [id number] Patient Account Number: [id number] Date of Birth/Sex: Treating RN: Nov 12, 1955 (63 y.o. [name] Primary Care Adonys Wildes: [name] Other Clinician: Referring Hale Chalfin: Treating Keonta Alsip/Extender:Robson, Tawny Asal in Treatment: 336 Wound Status Wound Number: 3 Primary Venous Leg Ulcer Etiology: Wound Location: Left Malleolus - Medial Wound Open Wounding Event: Gradually Appeared Status: Date Acquired: 11/15/2012 Comorbid Congestive Heart Failure, Peripheral Vascular Weeks Of Treatment: 336 History: Disease, Congestive Heart Failure, End Stage Clustered Wound: No Renal Disease, Tobacco Use, Chronic Obstructive Pulmonary Disease (COPD), Type 1 Diabetes Photos Wound Measurements Length: (cm) 2 % Reduct Width: (cm) 1.5 % Reduct Depth: (cm) 0.1 Epitheli Area: (cm) 2.356 Tunneli Volume: (cm) 0.236 Undermi Wound Description Classification: Full Thickness Without Exposed Support Foul Structures Slou Wound Distinct, outline attached Margin: Exudate Medium Amount: Exudate Serosanguineous Type: Exudate red, brown Color: Wound Bed Granulation Amount: Large (67-100%) Granulation Quality: Red, Pink Fasc Necrotic Amount: Small (1-33%) Fat Necrotic Quality: Adherent Slough Tend Musc Join Bone Odor After Cleansing: No gh/Fibrino  Yes Exposed Structure ia Exposed: No Layer (Subcutaneous Tissue) Exposed: Yes on Exposed: No le Exposed: No t Exposed: No Exposed: No ion in Area: 25% ion in Volume: 62.4% alization: Small (1-33%) ng: No ning: No Treatment Notes Wound #3 (Left, Medial Malleolus) 1. Cleanse With Wound Cleanser Soap and water 2. Periwound Care Barrier cream TCA Ointment 3. Primary Dressing Applied Other primary dressing (specifiy in notes) 4. Secondary Dressing Dry Gauze Kerramax/Xtrasorb 5. Secured With Tape 6. Support Layer Teacher, English as a foreign language Boot Notes [place]. no kerlix. primary dressing sorbact. foam to anterior lower leg for protection. Electronic Signature(s) Signed: 05/23/2019 6:06:17 PM By: Mikeal Hawthorne EMT/HBOT Signed: 05/24/2019 6:26:19 PM By: Levan Hurst RN, BSN Entered By: Mikeal Hawthorne on 05/23/2019 12:17:48 -------------------------------------------------------------------------------- [place] Details Patient Name: Date of Service: [name] 05/20/2019 10:00 AM Medical Record [id number] Patient Account Number: [id number] Date of Birth/Sex: Treating RN: 05-Jun-1955 (63 y.o. [name] Primary Care Alis Sawchuk: [name] Other Clinician: Referring Aida Lemaire: Treating Jacari Kirsten/Extender:Robson, Tawny Asal in Treatment: 336 Vital Signs Time Taken: 10:34 Temperature (F): 98.1 Height (in): 68 Pulse (bpm): 67 Weight (lbs): 127 Respiratory Rate (breaths/min): 18 Body Mass Index (BMI): 19.3 Blood Pressure (mmHg): 116/48 Reference Range: 80 - 120 mg / dl Electronic Signature(s) Signed: 05/24/2019 6:26:19 PM By: Levan Hurst RN, BSN Entered By: Levan Hurst on 05/20/2019 10:35:15

## 2019-06-01 ENCOUNTER — Encounter (HOSPITAL_BASED_OUTPATIENT_CLINIC_OR_DEPARTMENT_OTHER): Payer: Medicare Other | Admitting: Internal Medicine

## 2019-06-01 ENCOUNTER — Other Ambulatory Visit: Payer: Self-pay

## 2019-06-01 DIAGNOSIS — E10622 Type 1 diabetes mellitus with other skin ulcer: Secondary | ICD-10-CM | POA: Diagnosis not present

## 2019-06-01 NOTE — Progress Notes (Addendum)
SYAN, CLACK ([id number]) Visit Report for 06/01/2019 HPI Details Patient Name: Date of Service: LUETTA, SUNDET 06/01/2019 10:00 AM Medical Record [id number] Patient Account Number: [id number] Date of Birth/Sex: Treating RN: 1955/09/02 (63 y.o. F) Primary Care Provider: [name] Other Clinician: Referring Provider: Treating Provider/Extender:Elanna Bert, Cherly [place] in Treatment: 337 History of Present Illness HPI Description: the remaining wound is over the left medial ankle. Similar wound over the right medial ankle healed largely with use of Apligraf. Most recently we have been using Hydrofera Blue over this wound with considerable improvement. The patient has been extensively worked up in the past for her venous insufficiency and she is not a candidate for antireflux surgery although I have none of the details available currently. 08/24/14; considerable improvement today. About 50% of this wound areas now epithelialized. The base of the wound appears to be healthier granulation.as opposed to last week when she had deteriorated a considerable improvement 08/17/14; unfortunately the wound has regressed somewhat. The areas of epithelialization from the superior aspect are not nearly as healthy as they were last week. The patient thinks her Hydrofera Blue slipped. 09/07/14; unfortunately the area has markedly regressed in the 2 weeks since I've seen this. There is an odor surrounding erythema. The healthy granulation tissue that we had at the base of the wound now is a dusky color. The nurse reports green drainage 09/14/14; the area looks somewhat better than last week. There is less erythema and less drainage. The culture I did did not show any growth. Nevertheless I think it is better to continue the Cipro and doxycycline for a further week. The remaining wound area was debridement. 09/21/14. Wound did not require debridement last week. Still less erythema and  less drainage. She can complete her antibiotics. The areas of epithelialization in the superior aspect of the wound do not look as healthy as they did some weeks ago 10/05/14 continued improvement in the condition of this wound. There is advancing epithelialization. Less aggressive debridement required 10/19/14 continued improvement in the condition and volume of this wound. Less aggressive debridement to the inferior part of this to remove surface slough and fibrinous eschar 11/02/14 no debridement is required. The surface granulation appears healthy although some of her islands of epithelialization seem to have regressed. No evidence of infection 11/16/14; lites surface debridement done of surface eschar. The wound does not look to be unhealthy. No evidence of infection. Unfortunately the patient has had podiatry issues in the right foot and for some reason has redeveloped small surface ulcerations in the medial right ankle. Her original presentation involved wounds in this area 11/23/14 no debridement. The area on the right ankle has enlarged. The left ankle wound appears stable in terms of the surface although there is periwound inflammation. There has been regression in the amount of new skin 11/30/14 no debridement. Both wound areas appear healthy. There was no evidence of infection. The the new area on the right medial ankle has enlarged although that both the surfaces appear to be stable. 12/07/14; Debridement of the right medial ankle wound. No no debridement was done on the left. 12/14/14 no major change in and now bilateral medial ankle wounds. Both of these are very painful but the no overt evidence of infection. She has had previous venous ablation 12/21/14; patient states that her right medial ankle wound is considerably more painful last week than usual. Her left is also somewhat painful. She could not tolerate debridement. The right medial ankle wound has  fibrinous surface eschar 12/28/14  this is a patient with severe bilateral venous insufficiency ulcers. For a considerable period of time we actually had the one on the right medial ankle healed however this recently opened up again in June. The left medial ankle wound has been a refractory area with some absent flows. We had some success with Hydrofera Blue on this area and it literally closed by 50% however it is recently opened up Foley. Both of these were debridement today of surface eschar. She tolerates this poorly 01/25/15: No change in the status of this. Thick adherent escar. Very poor tolerance of any attempt at debridement. I had healed the right medial malleolus wound for a considerable amount of time and had the left one down to about 50% of the volume although this is totally regressed over the last 48 weeks. Further the right leg has reopened. she is trying to make a appointment with pain and vascular, previous ablations with Dr. Aleda Grana. I do not believe there is an arterial insufficiency issue here 02/01/15 the status of the adherent eschar bilaterally is actually improved. No debridement was done. She did not manage to get vascular studies done 02/08/15 continued debridement of the area was done today. The slough is less adherent and comes off with less pressure. There is no surrounding infection peripheral pulses are intact 02/15/15 selective debridement with a disposable curette. Again the slough is less adherent and comes off with less difficulty. No surrounding infection peripheral pulses are intact. 02/22/15 selective debridement of the right medial ankle wound. Slough comes off with less difficulty. No obvious surrounding infection peripheral pulses are intact I did not debridement the one on the left. Both of these are stable to improved 03/01/15 selective debridement of both wound areas using a curette to. Adherent slough cup soft with less difficulty. No obvious surrounding infection. The patient tells me  that 2 days ago she noted a rash above the right leg wrap. She did not have this on her lower legs when she change this over she arrives with widespread left greater than right almost folliculitis-looking rash which is extremely pruritic. I don't see anything to culture here. There is no rash on the rest of her body. She feels well systemically. 03/08/15; selective debridement of both wounds using a curette. Base of this does not look unhealthy. She had limegreen drainage coming out of the left leg wound and describes a lot of drainage. The rash on her left leg looks improved to. No cultures were done. 03/22/15; patient was not here last week. Basal wounds does not look healthy and there is no surrounding erythema. No drainage. There is still a rash on the left leg that almost looks vasculitic however it is clearly limited to the top of where the wrap would be. 04/05/15; on the right required a surgical debridement of surface eschar and necrotic subcutaneous tissue. I did not debridement the area on the left. These continue to be large open wounds that are not changing that much. We were successful at one point in healing the area on the right, and at the same time the area on the left was roughly half the size of current measurements. I think a lot of the deterioration has to do with the prolonged time the patient is on her feet at work 04/19/15 I attempted-like surface debridement bilaterally she does not tolerate this. She tells me that she was in allergic care yesterday with extreme pain over her left lateral malleolus/ankle and  was told that she has an "sprain" 05/03/15; large bilateral venous insufficiency wounds over the medial malleolus/medial aspect of her ankles. She complains of copious amounts of drainage and his usual large amounts of pain. There is some increasing erythema around the wound on the right extending into the medial aspect of her foot to. historically she came in with  these wounds the right one healed and the left one came down to roughly half its current size however the right one is reopened and the left is expanded. 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 05/10/15 large bilateral venous insufficiency wounds. There is less adherence surface left however the surface culture that I did last week grew pseudomonas therefore bilateral selective debridement score necessary. There is surrounding erythema. The patient describes severe bilateral drainage and a lot of pain in the left ankle. Apparently her podiatrist was were ready to do a cortisone shot 05/17/15; the patient complains of pain and again copious amounts of drainage. 05/24/15; we used Iodo flex last week. Patient notes considerable improvement in wound drainage. Only needed to change this once. 05/31/15; we continued Iodoflex; the base of these large wounds bilaterally is not too bad but there is probably likely a significant bioburden here. I would like to debridement just doesn't tolerate it. 06/06/14 I would like to continue the Iodoflex although she still hasn't managed to obtain supplies. She has bilateral medial malleoli or large wounds which are mostly superficial. Both of them are covered circumferentially with some nonviable fibrinous slough although she tolerates debridement very poorly. She apparently has an appointment for an ablation on the right leg by interventional radiology. 06/14/15; the patient arrives with the wounds and static condition. We attempted a debridement although she does not do well with this secondary to pain. I 07/05/15; wounds are not much smaller however there appears to be a cleaner granulating base. The left has tight fibrinous slough greater than the right. Debridement is tolerated poorly due to pain. Iodoflex is done more for these wounds in any of the multitude of different dressings I have tried on the left 1 and  then subsequently the right. 07/12/15; no change in the condition of this wound. I am able to do an aggressive debridement on the right but not the left. She simply cannot tolerate it. We have been using Iodoflex which helps somewhat. It is worthwhile remembering that at one point we healed the right medial ankle wound and the left was about 25% of the current circumference. We have suggested returning to vascular surgery for review of possible further ablations for one reason or another she has not been able to do this. 07/26/15 no major change in the condition of either wound on her medial ankle. I did not attempt to debridement of these. She has been aggressively scrubbing these while she is in the shower at home. She has her supply of Iodoflex which seems to have done more for these wounds then anything I have put on recently. 08/09/15 wound area appears larger although not verified by measurements. Using Iodoflex 09/05/2015 -- she was here for avisit today but had significant problems with the wound and I was asked to see her for a physician opinion. I have summarize that this lady has had surgery on her left lower extremity about 10 years ago where the possible veins stripping was done. She has had an opinion from interventional radiology around November  2016 where no further sclerotherapy was ordered. The patient works 12 hours a day and stands on a concrete floor with work boots and is unable to get the proper compression she requires and cannot elevate her limbs appropriately at any given time. She has recently grown Pseudomonas from her wound culture but has not started her ciprofloxacin which was called in for her. 09/13/15 this continues to be a difficult situation for this patient. At one point I had this wound down to a 1.5 x 1.5" wound on her left leg. This is deteriorated and the right leg has reopened. She now has substantial wounds on her medial calcaneus, malleoli and into her lower  leg. One on the left has surface eschar but these are far too painful for me to debridement here. She has a vascular surgery appointment next week to see if anything can be done to help here. I think she has had previous ablations several years ago at [facility]. She has no major edema. She tells me that she did not get product last time [facility] and went for several days without it. She continues to work in work boots 12 hours a day. She cannot get compression/4-layer under her work boots. 09/20/15 no major change. Periwound edema control was not very good. Her point with pain and vascular is next Wednesday the 25th 09/28/15; the patient is seen vascular surgery and is apparently scheduled for repeat duplex ultrasounds of her bilateral lower legs next week. 10/05/15; the patient was seen by Dr. Doren Custard of vascular surgery. He feels that she should have arterial insufficiency excluded as cause/contributed to her nonhealing stage she is therefore booked for an arteriogram. She has apparently monophasic signals in the dorsalis pedis pulses. She also of course has known severe chronic venous insufficiency with previous procedures as noted previously. I had another long discussion with the patient today about her continuing to work 12 hour shifts. I've written her out for 2 months area had concerns about this as her work location is currently undergoing significant turmoil and this may lead to her termination. She is aware of this however I agree with her that she simply cannot continue to stand for 12 hours multiple days a week with the substantial wound areas she has. 10/19/15; the Dr. Doren Custard appointment was largely for an arteriogram which was normal. She does not have an arterial issue. He didn't make a comment about her chronic venous insufficiency for which she has had previous ablations. Presumably it was not felt that anything additional could be done. The patient is now out of work as I  prescribed 2 weeks ago. Her wounds look somewhat less aggravated presumably because of this. I felt I would give debridement another try today 10/25/15; no major change in this patient's wounds. We are struggling to get her product that she can afford into her own home through her insurance. 11/01/15; no major change in the patient's wounds. I have been using silver alginate as the most affordable product. I spoke to Dr. Marla Roe last week with her requested take her to the OR for surgical debridement and placement of ACEL. Dr. Marla Roe told me that she would be willing to do this however [facility] will not cover this, fortunately the patient has [place] healthcare of some variant 11/08/15; no major change in the patient's wounds. She has been completely nonviable surface that this but is in too much pain with any attempted debridement are clinic. I have arranged for  her to see Dr. Marla Roe ham of plastic surgery and this appointment is on Monday. I am hopeful that they will take her to the OR for debridement, possible ACEL ultimately possible skin graft 11/22/15 no major change in the patient's wounds over her bilateral medial calcaneus medial malleolus into the lower legs. Surface on these does not look too bad however on the left there is surrounding erythema and tenderness. This may be cellulitis or could him sleepy tinea. 11/29/15; no major changes in the patient's wounds over her bilateral medial malleolus. There is no infection here and I don't think any additional antibiotics are necessary. There is now plan to move forward. She sees Dr. Marla Roe in a week's time for preparation for operative debridement and ACEL placement I believe on 7/12. She then has a follow-up appointment with Dr. Marla Roe on 7/21 12/28/15; the patient returns today having been taken to the [place] by Dr. Marla Roe 12/12/15 she underwent debridement, intraoperative cultures [which were negative]. She had  placement of a wound VAC. Parent really ACEL was not available to be placed. The wound VAC foam apparently adhered to the wound since then she's been using silver alginate, Xeroform under Ace wraps. She still says there is a lot of drainage and a lot of pain 01/31/16; this is a patient I see monthly. I had referred her to Dr. Marla Roe him of plastic surgery for large wounds on her bilateral medial ankles. She has been to the OR twice once in early July and once in early August. She tells me over the last 3 weeks she has been using the wound VAC with ACEL underneath it. On the right we've simply been using silver alginate. Under Kerlix Coban wraps. 02/28/16; this is a patient I'm currently seeing monthly. She is gone on to have a skin graft over her large venous insufficiency ulcer on the left medial ankle. This was done by Dr. Marla Roe him. The patient is a bit perturbed about why she didn't have one on her right medial ankle wound. She has been using silver alginate to this. 03/06/16; I received a phone call from her plastic surgery Dr. Marla Roe. She expressed some concern about the viability of the skin graft she did on the left medial ankle wound. Asked me to place Endoform on this. She told me she is not planning to do a subsequent skin graft on the right as the left one did not take very well. I had placed Hydrofera Blue on the right 03/13/16; continue to have a reasonably healthy wound on the right medial ankle. Down to 3 mm in terms of size. There is epithelialization here. The area on the left medial ankle is her skin graft site. I suppose the last week this looks somewhat better. She has an open area inferiorly however in the center there appears to be some viable tissue. There is a lot of surface callus and eschar that will eventually need to come off however none of this looked to be infected. Patient states that the is able to keep the dressing on for several days which is an  improvement. 03/20/16 no major change in the circumference of either wound however on the left side the patient was at Dr. Eusebio Friendly office and they did a debridement of left wound. 50% of the wound seems to be epithelialized. I been using Endoform on the left Hydrofera Blue in the right 03/27/16; she arrives today with her wound is not looking as healthy as they did last week.  The area on the right clearly has an adherent surface to this a very similar surface on the left. Unfortunately for this patient this is all too familiar problem. Clearly the Endoform is not working and will need to change that today that has some potential to help this surface. She does not tolerate debridement in this clinic very well. She is changing the dressing wants 04/03/16; patient arrives with the wounds looking somewhat better especially on the right. Dr. Migdalia Dk change the dressing to silver alginate when she saw her on Monday and also sold her some compression socks. The usefulness of the latter is really not clear and woman with severely draining wounds. 04/10/16; the patient is doing a bit of an experiment wearing the compression stockings that Dr. Migdalia Dk provided her to her left leg and the out of legs based dressings that we provided to the right. 05/01/16; the patient is continuing to wear compression stockings Dr. Migdalia Dk provided her on the left that are apparently silver impregnated. She has been using Iodoflex to the right leg wound. Still a moderate amount of drainage, when she leaves here the wraps only last for 4 days. She has to change the stocking on the left leg every night 05/15/16; she is now using compression stockings bilaterally provided by Dr. Marla Roe. She is wearing a nonadherent layer over the wounds so really I don't think there is anything specific being done to this now. She has some reduction on the left wound. The right is stable. I think all healing here is being done without a  specific dressing 06/09/16; patient arrives here today with not much change in the wound certainly in diameter to large circular wounds over the medial aspect of her ankle bilaterally. Under the light of these services are certainly not viable for healing. There is no evidence of surrounding infection. She is wearing compression stockings with some sort of silver impregnation as prescribed by Dr. Marla Roe. She has a follow-up with her tomorrow. 06/30/16; no major change in the size or condition of her wounds. These are still probably covered with a nonviable surface. She is using only her purchase stockings. She did see Dr. Marla Roe who seemed to want to apply Dakin's solution to this I'm not extreme short what value this would be. I would suggest Iodoflex which she still has at home. 07/28/16; I follow Mrs. Mcmains episodically along with Dr. Marla Roe. 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 She has been applying a topical collagen ointment to both wounds with Adaptic. I don't think Dr. Marla Roe is planning to take her back to the OR. 08/19/16; I follow Mrs. Jeneen Rinks on a monthly basis along with Dr. Marla Roe of plastic surgery. 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 I been following her for a number of years. At one point I was able to get the right medial malleolus wound to heal and had the left medial malleolus down to about half its current size however and I had to send her to plastic surgery for an operative debridement. Since then things have been stable to slightly improve the area on the right is slightly better one in the left about the same although there is much less adherent surface than I'm used to with this patient. She is using some form of liquid collagen gel that Dr. Marla Roe provided a Kerlix cover with the patient's own  pressure stockings.  She tells me that she has extreme pain in both ankles and along the lateral aspect of both feet. She has been unable to work for some period of time. She is telling me she is retiring at the beginning of April. She sees Dr. Doran Durand of orthopedics next week 09/22/16; patient has not seen Dr. Marla Roe since the last time she is here. I'm not really sure what she is using to the wounds other than bits and pieces of think she had left over including most recently Hydrofera Blue. She is using juxtalite stockings. She is having difficulty with her husband's recent illness "stroke". She is having to transport him to various doctors appointments. Dr. Marla Roe left her the option of a repeat debridement with ACEL however she has not been able to get the time to follow-up on this. She continues to have a fair amount of drainage out of these wounds with certainly precludes leaving dressings on all week 10/13/16; patient has not seen Dr. Marla Roe since she was last in our clinic. I'm not really sure what she is doing with the wounds, we did try to get her [facility] and I think she is actually using this most of the time. Because of drainage she states she has to change this every second day although this is an improvement from what she used to do. She went to see Dr. Doran Durand who did not think she had a muscular issue with regards to her feet, he referred her to a neurologist and I think the appointment is sometime in June. I changed her back to Iodoflex which she has used in the past but not recently. 11/03/16; the patient has been using Iodoflex although she ran out of this. Still claims that there is a lot of drainage although the wound does not look like this. No surrounding erythema. She has not been back to see Dr. Marla Roe 11/24/16; the patient has been using Iodoflex again but she ran out of it 2 or 3 days ago. There is no major change in the condition of either one of these  wounds in fact they are larger and covered in a thick adherent surface slough/nonviable tissue especially on the left. She does not tolerate mechanical debridement in our clinic. Going back to see Dr. Marla Roe of plastic surgery for an operative debridement would seem reasonable. 12/15/16; the patient has not been back to see Dr. Marla Roe. She is been dealing with a series of illnesses and her husband which of monopolized her time. She is been using Sorbact which we largely supplied. She states the drainage is bad enough that it maximum she can go 2-3 days without changing the dressing 01/12/2017 -- the patient has not been back for about 4 weeks and has not seen Dr. Marla Roe not does she have any appointment pending. 01/23/17; patient has not seen Dr. Marla Roe even though I suggested this previously. She is using Santyl that was suggested last week by Dr. Con Memos this Cost her $16 through her insurance which is indeed surprising 02/12/17; continuing Santyl and the patient is changing this daily. A lot of drainage. She has not been back to see plastic surgery she is using an Ace wrap. Our intake nurse suggested wrap around stockings which would make a good reasonable alternative 02/26/17; patient is been using Santyl and changing this daily due to drainage. She has not been to see plastic surgery she uses in April Ace wrap to control the edema. She did obtain extremitease stockings but stated  that the edema in her leg was to big for these 03/20/17; patient is using Santyl and Anasept. Surfaces looked better today the area on the right is actually measuring a little smaller. She has states she has a lot of pain in her feet and ankles and is asking for a consult to pain control which I'll try to help her with through our case manager. 04/10/17; the patient arrives with better-looking wound surfaces and is slightly smaller wound on the left she is using a combination of Santyl and Anasept. She has  an appointment or at least as started in the pain control center associated with [facility] 05/14/17; this is a patient who I followed for a prolonged period of time. 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 At one point I had this down to a much smaller wound on the left however these reopened and we've never been able to get these to heal. She has been using Santyl and Anasept gel although 2 weeks ago she ran out of the Anasept gel. She has a stable appearance of the wound. She is going to the wound care clinic at [facility]. They wanted do a nerve block/spinal block although she tells me she is reluctant to go forward with that. 05/21/17; this is a patient I have followed for many years. 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 Chronic pain and deformity in her ankles as well. She is been to see plastic surgery as well as orthopedics. Using PolyMem AG most recently/Kerramax/ABDs and 2 layer compression. She has managed to keep this on and she is coming in for a nurse check to change the dressing on Tuesdays, we see her on Fridays 06/05/17; really quite a good looking surface and the area especially on the right medial has contracted in terms of dimensions. Well granulated healthy-looking tissue on both sides. Even with an open curet there is nothing that even feels abnormal here. This is as good as I've seen this in quite some time. We have been using PolyMem AG and bringing her in for a nurse check 06/12/17; really quite good surface on both of these wounds. The right medial has contracted a bit left is not. We've been using PolyMem and AG and she is coming in for a nurse visit 06/19/17; we have been using PolyMem AG and bringing her in for a nurse check. Dimensions of her wounds are not better but the surfaces looked better bilaterally. She complained of bleeding last night and the left wound and increasing pain bilaterally. She  states her wound pain is more neuropathic than just the wounds. There was some suggestion that this was radicular from her pain management doctor in talking to her it is really difficult to sort this out. 06/26/17; using PolyMem and AG and bringing her in for a nurse check as All of this and reasonably stable condition. Certainly not improved. The dimensions on the lateral part of the right leg look better but not really measuring better. The medial aspect on the left is about the same. 07/03/16; we have been using PolyMen AG and bringing her in for a nurse check to change the dressings as the wounds have drainage which precludes once weekly changing. We are using all secondary absorptive dressings.our intake nurse is brought up the idea of using a wound VAC/snap VAC on the wound to help with the drainage to see if this would result in some contraction. This is not a  bad idea. The area on the right medial is actually looking smaller. Both wounds have a reasonable-looking surface. There is no evidence of cellulitis. The edema is well controlled 07/10/17; the patient was denied for a snap VAC by her insurance. The major issue with these wounds continues to be drainage. We are using wicked PolyMem AG and she is coming in for a nurse visit to change this. The wounds are stable to slightly improved. The surface looks vibrant and the area on the right certainly has shrunk in size but very slowly 07/17/17; the patient still has large wounds on her bilateral medial malleoli. Surface of both of these wounds looks better. The dimensions seem to come and go but no consistent improvement. There is no epithelialization. We do not have options for advanced treatment products due to insurance issues. They did not approve of the wound VAC to help control the drainage. More recently we've been using PolyMem and AG wicked to allow drainage through. We have been bringing her in for a nurse visit to change this. We do not  have a lot of options for wound care products and the home again due to insurance issues 07/24/17; the patient's wound actually looks somewhat better today. No drainage measurements are smaller still healthy-looking surface. We used silver collagen under PolyMen started last week. We have been bringing her in for a dressing change 07/31/17; patient's wound surface continued to look better and I think there is visible change in the dimensions of the wound on the right. Rims of epithelialization. We have been using silver collagen under PolyMen and bringing her in for a dressing change. There appears to be less drainage although she is still in need of the dressing change 08/07/17. Patient's wound surface continues to look better on both sides and the area on the right is definitely smaller. We have been using silver collagen and PolyMen. She feels that the drainage has been it has been better. I asked her about her vascular status. She went to see Dr. Aleda Grana at [facility] and had some form of ablation. I don't have much detail on this. I haven't my notes from 2016 that she was not a candidate for any further ablation but I don't have any more information on this. We had referred her to vein and vascular I don't think she ever went. He does not have a history of PAD although I don't have any information on this either. We don't even have ABIs in our record 08/14/17; we've been using silver collagen and PolyMen cover. And putting the patient and compression. She we are bringing her in as a nurse visit to change this because ofarge amount of drainage. We didn't the ABIs in clinic today since they had been done in many moons 1.2 bilaterally. She has been to see vein and vascular however this was at [facility] and she had ablation although I really don't have any information on this all seemed biking get a report. She is also been operatively debrided by plastic surgery and had a cell placed  probably 8-12 months ago. This didn't have a major effect. We've been making some gains with current dressings 08/19/17-She is here in follow-up evaluation for bilateral medial malleoli ulcers. She continues to tolerate debridement very poorly. We will continue with recently changed topical treatment; if no significant improvement may consider switching to Iodosorb/Iodoflex. 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 She has been using silver collagen and  PolyMem. I believe she has been used and tried on Iodoflex before. 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 These have been very refractory over time. We have been using silver collagen and PolyMen. She is coming in once a week for a doctor's and once a week for nurse visits. We are actually making some progress 09/18/17; the patient's wounds are smaller especially on the right medial. She arrives today to upset to consider even washing these off with Anasept which I think is been part of the reason this is been closing. We've been using collagen covered in PolyMen otherwise. It is noted that she has a small area of folliculitis on the right medial calf that. As we are wrapping her legs I'll give her a short course of doxycycline to make sure this doesn't amount to anything. She is a long list of complaints today including imbalance, shortness of breath on exertion, inversion of her left ankle. With regards to the latter complaints she is been to see orthopedics and they offered her a tendon release surgery I believe but wanted her wounds to be closed first. I have recommended she go see her primary physician with regards to everything else. 09/25/17; patient's wounds are about the same size. We have made  some progress bilaterally although not in recent weeks. She will not allow me T wash these wounds with Anasept even if she is doing her cell. Wheeze we've been using collagen covered in PolyMen. Last week she had a small area of folliculitis this is now opened into a small wound. She completed 5 days of trimethoprim sulfamethoxazole 10/02/17; unfortunately the area on her left medial ankle is worse with a larger wound area towards the Achilles. The patient complains of a lot of pain. She will not allow debridement although visually I don't think there is anything to debridement in any case. We have been using silver collagen and PolyMen for several months now. Initially we are making some progress although I'm not really seeing that today. We will move back to [facility]. His admittedly this is a bit of a repeat however I'm hoping that his situation is different now. The patient tells me she had her leg on the left give out on her yesterday this is process some pain. 10/09/17; the patient is seen twice a week largely because of drainage issues coming out of the chronic medial bimalleolar wounds that are chronic. Last week the dimensions of the one on the left looks a little larger I changed her to [facility]. She comes in today with a history of terrible pain in the bilateral wound areas. She will not allow debridement. She will not even allow a tissue culture. There is no surrounding erythema no no evidence of cellulitis. We have been putting her Kerlix Coban man. She will not allow more aggressive compression as there was a suggestion to put her in 3 layer wraps. 10/16/17; large wounds on her bilateral medial malleoli. These are chronic. Not much change from last week. The surface looks have healthy but absolutely no epithelialization. A lot of pain little less so of drainage. She will not allow debridement or even washing these off in the vigorous fashion with Anasept. 10/23/17; large wounds  on her bilateral malleoli which are chronic. Some improvement in terms of size  perhaps on the right since last time I saw these. She states that after we increased the 3 layer compression there was some bleeding, when she came in for a nurse visit she did not want 3 layer compression put back on about our nurse managed to convince her. She has known chronic venous visit issues and I'm hoping to get her to tolerate the 3 layer compression. using Hydrofera Blue 10/30/17; absolutely no change in the condition of either wound although we've had some improvement in dimensions on the right.. Attempted to put her in 3 layer compression she didn't tolerated she is back in 2 layer compression. We've been using Hydrofera Blue We looked over her past records. She had venous reflux studies in November 2016. There was no evidence of deep venous reflux on the right. Superficial vein did not show the greater saphenous vein at think this is been previously ablated the small saphenous vein was within normal limits. The left deep venous system showed no DVT the vessels were positive for deep venous reflux in the posterior tibial veins at the ankle. The greater saphenous vein was surgically absent small saphenous vein was within normal limits. She went to vein and vascular at [facility]. I believe she had an ablation on the left greater saphenous vein. I'll update her reflux studies perhaps ever reviewed by vein and vascular. We've made absolutely no progress in these wounds. Will also try to read and TheraSkins through her insurance 11/06/17; W the patient apparently has a 2 week follow-up with vein and vascular I like him to review the whole issue with regards to her previous vascular workup by Dr. Aleda Grana. We've really made no progress on these wounds in many months. She arrives today with less viable looking surface on the left medial ankle wound. This was apparently looking about the same on Tuesday when  she was here for nurse visit. 11/13/17; deep tissue culture I did last time of the left lower leg showed multiple organisms without any predominating. In particular no Staphylococcus or group A strep were isolated. We sent her for venous reflux studies. She's had a previous left greater saphenous vein stripping and I think sclerotherapy of the right greater saphenous vein. She didn't really look at the lesser saphenous vein this both wounds are on the medial aspect. She has reflux in the common femoral vein and popliteal vein and an accessory vein on the right and the common femoral vein and popliteal vein on the left. I'm going to have her go to see vein and vascular just the look over things and see if anything else beside aggressive compression is indicated here. We have not been able to make any progress on these wounds in spite of the fact that the surface of the wounds is never look too bad. 11/20/17; no major change in the condition of the wounds. Patient reports a large amount of drainage. She has a lot of complaints of pain although enlisting her today I wonder if some of this at least his neuropathic rather than secondary to her wounds. She has an appointment with vein and vascular on 12/30/17. The refractory nature of these wounds in my mind at least need vein and vascular to look over the wounds the recent reflux studies we did and her history to see if anything further can be done here. I also note her gait is deteriorated quite a bit. 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 She has a bilateral  Trendelenburg gait. I wonder if this is neuropathic or perhaps multilevel radicular. 11/27/17; her wounds actually looks slightly better. Healthy-looking granulation tissue a scant amount of epithelialization. [place] healthcare will not pay for [employer]. They will play for tri layer Oasis and Dermagraft. This is not a diabetic ulcer. We'll try for the tri layer Oasis. She still  complains of some drainage. She has a vein and vascular appointment on 12/30/17 12/04/17; the wounds visually look quite good. Healthy-looking granulation with some degree of epithelialization. We are still waiting for response to our request for trial to try layer Oasis. Her appointment with vascular to review venous and arterial issues isn't sold the end of July 7/31. Not allow debridement or even vigorous cleansing of the wound surface. 12/18/17; slightly smaller especially on the right. 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 We've been using Hydrofera Blue. We still are looking into triple layer Oasis through her insurance 01/08/18 on evaluation today patient's wound actually appears to be showing signs of good improvement at this point in time. She has been tolerating the dressing changes without complication. Fortunately there does not appear to be any evidence of infection at this point in time. We have been utilizing silver nitrate which does seem to be of benefit for her which is also good news. Overall I'm very happy with how things seem to be both regards appearance as well as measurement. Patient did see Dr. Bridgett Larsson for evaluation on 12/30/17. In his assessment he felt that stripping would not likely add much more than chronic compression to the patient's healing process. His recommendation was to follow-up in three months with Dr. Doren Custard if she hasn't healed in order to consider referral back to you and see vascular where she previously was in a trial and was able to get her wound to heal. I'll be see what she feels she when you staying compression and he reiterated this as well. 01/13/18 on evaluation today patient appears to actually be doing very well in regard to her bilateral medial malleolus ulcers. She seems to have tolerated the chemical cauterization with silver nitrate last week she did have some pain through that evening but fortunately states that I'll  be see since it seems to be doing better she is overall pleased with the progress. 01/21/18; really quite a remarkable improvement since I've last seen these wounds. We started using silver nitrate specially on the islands of hyper granulation which for some reason her around the wound circumference. This is really done quite nicely. Primary dressing Hydrofera Blue under 4 layer compression. She seems to be able to hold out without a nurse rewrap. Follow-up in 1 week 01/28/18; we've continued the hydrofera blue but continued with chemical cauterization to the wound area that we started about a month ago for irregular hyper granulation. She is made almost stunning improvement in the overall wound dimensions. I was not really expecting this degree of improvement in these chronic wounds 02/05/18; we continue with Hydrofera Bluebut of also continued the aggressive chemical cauterization with silver nitrate. We made nice progress with the right greater than left wound. 02/12/18. We continued with Hydrofera Blue after aggressive chemical cauterization with silver nitrate. We appear to be making nice progress with both wound areas 02/19/2018; we continue with [facility] after washing the wounds vigorously with Anasept spray and chemical cauterization with silver nitrate. We are making excellent progress. The area on the right's just about closed 02/26/2018. The area on the left medial ankle had  too much necrotic debris today. I used a #5 curette we are able to get most of the soft. I continued with the silver nitrate to the much smaller wound on the right medial ankle she had a new area on her right lower pretibial area which she says was due to a role in her compression 03/05/2018; both wound areas look healthy. Not much change in dimensions from last week. I continue to use silver nitrate and Hydrofera Blue. The patient saw Dr. Doren Custard of vein and vascular. He felt she had venous stasis ulcers. He felt  based on her previous arteriogram she should have adequate circulation for healing. Also she has deep venous reflux but really no significant correctable superficial venous reflux at this time. He felt we should continue with conservative management including leg elevation and compression 04/02/2018; since we last saw this woman about a month ago she had a fall apparently suffered a pelvic fracture. I did not look up the x-ray. Nevertheless because of pain she literally was bedbound for 2 weeks and had home health coming out to change the dressing. Somewhat predictably this is resulted in considerable improvement in both wound areas. The right is just about closed on the medial malleolus and the left is about half the size. 04/16/2018; both her wounds continue to go down in size. Using Hydrofera Blue. 05/07/18; both her wounds appeared to be improving especially on the right where it is almost closed. We are using Hydrofera Blue 05/14/2018; slightly worse this week with larger wounds. Surface on the left medial not quite as good. We have been using Hydrofera Blue 05/21/18; again the wounds are slightly larger. Left medial malleolus slightly larger with eschar around the circumference. We have been using Hydrofera Blue undergoing a wraps for a prolonged period of time. This got a lot better when she was more recumbent due to a fall and a back injury. I change the primary dressing the silver alginate today. She did not tolerate a 4 layer compression previously although I may need to bring this up with her next time 05/28/2018; area on the left medial malleolus again is slightly larger with more drainage. Area on the right is roughly unchanged. She has a small area of folliculitis on the right medial just on the lower calf. This does not look ominous. 06/03/2018 left medial malleolus slightly smaller in a better looking surface. We used silver nitrate on this last time with silver alginate. The area  on the right appears slightly smaller 1/10; left medial malleolus slightly smaller. Small open area on the right. We used silver nitrate and silver alginate as of 2 weeks ago. We continue with the wound and compression. These got a lot better when she was off her feet 1/17; right medial malleolus wound is smaller. The left may be slightly smaller. Both surfaces look somewhat better. 1/24; both wounds are slightly smaller. Using silver alginate under Unna boots 1/31; both wounds appear smaller in fact the area on the right medial is just about closed. Surface eschar. We have been using silver alginate under Unna boots. The patient is less active now spends let much less time on her feet and I think this is contributed to the general improvement in the wound condition 2/7; both wounds appear smaller. I was hopeful the right medial would be closed however there there is still the same small open area. Slight amount of surface eschar on the left the dimensions are smaller there is eschar but the wound  edges appear to be free. We have been using silver alginate under Unna boot's 2/14; both wounds once again measure smaller. Circumferential eschar on the left medial. We have been using silver alginate under Unna boots with gradual improvement 2/21; the area on the right medial malleolus has healed. The area on the left is smaller. We have been using silver alginate and Unna boots. We can discharge wrapping the right leg she has 20/30 stockings at home she will need to protect the scar tissue in this area 2/28; the area on the right medial malleolus remains closed the patient has a compression stocking. The area on the left is smaller. We have been using silver alginate and Unna boots. 3/6 the area on the right medial ankle remains closed. Good edema control noted she is using her own compression stocking. The area on the left medial ankle is smaller. We have been managing this with silver alginate and  Unna boots which we will continue today. 3/13; the area on the right medial ankle remains closed and I'm declaring it healed today. When necessary the left is about the same still a healthy-looking surface but no major change and wound area. No evidence of infection and using silver alginate under unna and generally making considerable improvement 3/27 the area on the right medial ankle remains closed the area on the left is about the same as last week. Certainly not any worse we have been using silver alginate under an Unna boot 4/3; the area on the right medial ankle remains closed per the patient. We did not look at this wound. The wound on the left medial ankle is about the same surface looks healthy we have been using silver alginate under an Unna boot 4/10; area on the right medial ankle remains closed per the patient. We did not look at this wound. The wound on the left medial ankle is slightly larger. The patient complains that the [facility] caused burning pain all week. She also told us that she was a lot more active this week. Changed her back to silver alginate 4/17; right medial ankle still closed per the patient. Left medial ankle is slightly larger. Using silver alginate. She did not tolerate Hydrofera Blue on this area 4/24; right medial ankle remains closed we have not look at this. The left medial ankle continues to get larger today by about a centimeter. We have been using silver alginate under Unna boots. She complains about 4 layer compression as an alternative. She has been up on her feet working on her garden 5/8; right medial ankle remains closed we did not look at this. The left medial ankle has increased in size about 100%. We have been using silver alginate under Unna boots. She noted increased pain this week and was not surprised that the wound is deteriorated 5/15; no major change in SA however much less erythema ( one week of doxy ocellulitis). 5/22-63 year old  female returns at 1 week to the clinic for left medial ankle wound for which we have been using silver alginate under 3 layer compression She was placed on DOXY at last visit - the wound is wider at this visit. She is in 3 layer compression 5/29; change to [facility] last week. I had given her empiric doxycycline 2 weeks ago for a week. She is in 3 layer compression. She complains of a lot of pain and drainage on presentation today. 6/5; using Hydrofera Blue. I gave her doxycycline recently empirically for erythema and  pain around the wound. Believe her cultures showed enterococcus which not would not have been well covered by doxycycline nevertheless the wound looks better and I don't feel specifically that the enterococcus needs to be covered. She has a new what looks like a wrap injury on her lateral left ankle. 6/12; she is using Hydrofera Blue. She has a new area on the left anterior lower tibial area. This was a wrap injury last week. 6/19; the patient is using Hydrofera Blue. She arrived with marked inflammation and erythema around the wound and tenderness. 12/01/18 on evaluation today patient appears to be doing a little bit better based on what I'm hearing from the standpoint of lassos evaluation to this as far as the overall appearance of the wound is concerned. Then sometime substandard she typically sees Dr. Dellia Nims. Nonetheless overall very pleased with the progress that she's made up to this point. No fevers, chills, nausea, or vomiting noted at this time. 7/10; some improvement in the surface area. Aggressively debrided last week apparently. I went ahead with the debridement today although the patient does not tolerate this very well. We have been using Iodoflex. Still a fair amount of drainage 7/17; slightly smaller. Using Iodoflex. 7/24; no change from last week in terms of surface area. We have been using Iodoflex. Surface looks and continues to look somewhat better 7/31;  surface area slightly smaller better looking surface. We have been using Iodoflex. This is under Unna boot compression 8/7-Patient presents at 1 week with Unna boot and Iodoflex, wound appears better 8/14-Patient presents at 1 week with Iodoflex, we use the Unna boot, wound appears to be stable better.Patient is getting Botox treatment for the inversion of the foot for tendon release, Next week 8/21; we are using Iodoflex. Unna boot. The wound is stable in terms of surface area. Under illumination there is some areas of the wound that appear to be either epithelialized or perhaps this is adherent slough at this point I was not really clear. It did not wipe off and I was reluctant to debride this today. 8/28; we are using Iodoflex in an Unna boot. Seems to be making good improvement. 9/4; using Iodoflex and wound is slightly smaller. 9/18; we are using Iodoflex with topical silver nitrate when she is here. The wound continues to be smaller 10/2; patient missed her appointment last week due to GI issues. She left and Iodoflex based dressing on for 2 weeks. Wound is about the same size about the size of a dime on the left medial lower 10/9 we have been using Iodoflex on the medial left ankle wound. She has a new superficial probable wrap injury on the dorsal left ankle 10/16; we have been using Hydrofera Blue since last week. This is on the left medial ankle 10/23; we have been using Hydrofera Blue since 2 weeks ago. This is on the left medial ankle. Dimensions are better 11/6; using Hydrofera Blue. I think the wound is smaller but still not closed. Left medial ankle 11/13; we have been using Hydrofera Blue. Wound is certainly no smaller this week. Also the surface not as good. This is the remanent of a very large area on her left medial ankle. 11/20; using Sorbact since last week. Wound was about the same in terms of size although I was disappointed about the surface debris 12/11; 3-week  follow-up. Patient was on vacation. Wound is measuring slightly larger we have been using Sorbact. 12/18; wound is about the same size however surface  looks better last week after debridement. We have been using Sorbact under compression 12/30 2 week follow up , wound surface appears stable, continuing sorbact Electronic Signature(s) Signed: 06/17/2019 12:38:45 PM By: Tobi Bastos MD, MBA Entered By: Tobi Bastos on 06/17/2019 12:38:45 -------------------------------------------------------------------------------- Physical Exam Details Patient Name: Date of Service: ESTI, VUNCANNON 06/01/2019 10:00 AM Medical Record [id number] Patient Account Number: [id number] Date of Birth/Sex: Treating RN: February 04, 1956 (63 y.o. F) Primary Care Provider: [name] Other Clinician: Referring Provider: Treating Provider/Extender:Saryah Loper, [place] in Treatment: 337 Constitutional alert and oriented x 3. sitting or standing blood pressure is within target range for patient.. supine blood pressure is within target range for patient.. pulse regular and within target range for patient.Marland Kitchen respirations regular, non-labored and within target range for patient.Marland Kitchen temperature within target range for patient.. . . Well-nourished and well-hydrated in no acute distress. Notes Left Ankle wound with no significant necrotic debris Electronic Signature(s) Signed: 06/17/2019 12:40:07 PM By: Tobi Bastos MD, MBA Entered By: Tobi Bastos on 06/17/2019 12:40:07 -------------------------------------------------------------------------------- Physician Orders Details Patient Name: Date of Service: ANERI, DEBRUHL 06/01/2019 10:00 AM Medical Record [id number] Patient Account Number: [id number] Date of Birth/Sex: Treating RN: [birth year] (63 y.o. 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 - Friday Dressing Change Frequency Wound #3 Left,Medial Malleolus Do not change entire dressing for one week. - change dressing in one week Skin Barriers/Peri-Wound Care Moisturizing lotion TCA Cream or Ointment - mixed with lotion Wound Cleansing Clean wound with Wound Cleanser - clean with anasept with dressing changes May shower with protection. Primary Wound Dressing Wound #3 Left,Medial Malleolus Other: - Sorbact. Apply foam to shin for protection Secondary Dressing Wound #3 Left,Medial Malleolus Dry Gauze Kerramax Edema Control Unna Boot to Left Lower Extremity - no kerlix layer Avoid standing for long periods of time Elevate legs to the level of the heart or above for 30 minutes daily and/or when sitting, a frequency of: - throughout the day Support Garment 20-30 mm/Hg pressure to: - compression stocking right leg daily Electronic Signature(s) Signed: 06/01/2019 5:10:58 PM By: Tobi Bastos MD, MBA Signed: 06/01/2019 5:40:33 PM By: Baruch Gouty RN, BSN Entered By: Baruch Gouty on 06/01/2019 10:46:05 -------------------------------------------------------------------------------- Problem List Details Patient Name: Date of Service: [name] 06/01/2019 10:00 AM Medical Record [id number] Patient Account Number: [id number] Date of Birth/Sex: Treating RN: [birth year] (63 y.o. 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 11/16/2014 No Yes of calf and inflammation Inactive Problems ICD-10 Code Description Active Date Inactive Date I83.223 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 08/03/2014 08/03/2014 and inflammation L03.116 Cellulitis of left lower limb 09/07/2014 09/07/2014 L97.321 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 breakdown 03/11/2019 03/11/2019 of skin Resolved Problems ICD-10 Code Description Active Date Resolved Date L97.312 Non-pressure chronic ulcer of right ankle with fat layer 04/10/2016 04/10/2016 exposed Electronic Signature(s) Signed: 06/01/2019  5:10:58 PM By: Tobi Bastos MD, MBA Signed: 06/01/2019 5:40:33 PM By: Baruch Gouty RN, BSN Entered By: Baruch Gouty on 06/01/2019 10:12:17 -------------------------------------------------------------------------------- Progress Note Details Patient Name: Date of Service: JEANNETTA, LUIZ 06/01/2019 10:00 AM Medical Record [id number] Patient Account Number: [id number] Date of Birth/Sex: Treating RN: 1955-08-27 (63 y.o. F) Primary Care Provider: [name] Other Clinician: Referring Provider: Treating Provider/Extender:Tnya Ades, [place] in Treatment: 337 Subjective History of Present Illness (HPI) the remaining wound is over the left medial ankle. Similar wound over the right medial ankle healed largely with use of Apligraf. Most recently we have been using Hydrofera Blue over this wound with considerable improvement. The patient has been extensively worked up in the past for her venous insufficiency and she is not a candidate for antireflux surgery although I have none of the details available currently. 08/24/14; considerable improvement today. About 50% of this wound areas now epithelialized. The base of  the wound appears to be healthier granulation.as opposed to last week when she had deteriorated a considerable improvement 08/17/14; unfortunately the wound has regressed somewhat. The areas of epithelialization from the superior aspect are not nearly as healthy as they were last week. The patient thinks her Hydrofera Blue slipped. 09/07/14; unfortunately the area has markedly regressed in the 2 weeks since I've seen this. There is an odor surrounding erythema. The healthy granulation tissue that we had at the base of the wound now is a dusky color. The nurse reports green drainage 09/14/14; the area looks somewhat better than last week. There is less erythema and less drainage. The culture I did did not show any growth. Nevertheless I think it is better to continue the Cipro and doxycycline for a further week. The remaining wound area was debridement. 09/21/14. Wound did not require debridement last week. Still less erythema and less drainage. She can complete her antibiotics. The areas of epithelialization in the superior aspect of the wound do not look as healthy as they did some weeks ago 10/05/14 continued improvement in the condition of this wound. There is advancing epithelialization. Less aggressive debridement required 10/19/14 continued improvement in the condition and volume of this wound. Less aggressive debridement to the inferior part of this to remove surface slough and fibrinous eschar 11/02/14 no debridement is required. The surface granulation appears healthy although some of her islands of epithelialization seem to have regressed. No evidence of infection 11/16/14; lites surface debridement done of surface eschar. The wound does not look to be unhealthy. No evidence of infection. Unfortunately the patient has had podiatry issues in the right foot and for some reason has redeveloped small surface ulcerations in the medial right ankle. Her original presentation involved wounds in this  area 11/23/14 no debridement. The area on the right ankle has enlarged. The left ankle wound appears stable in terms of the surface although there is periwound inflammation. There has been regression in the amount of new skin 11/30/14 no debridement. Both wound areas appear healthy. There was no evidence of infection. The the new area on the right medial ankle has enlarged although that both the surfaces appear to be stable. 12/07/14; Debridement of the right medial ankle wound. No no debridement was done on the left. 12/14/14 no major change in and now bilateral medial ankle wounds. Both of these are very painful but the no overt evidence of infection. She has had previous venous ablation 12/21/14; patient states that her right medial ankle wound is considerably more painful last week than  usual. Her left is also somewhat painful. She could not tolerate debridement. The right medial ankle wound has fibrinous surface eschar 12/28/14 this is a patient with severe bilateral venous insufficiency ulcers. For a considerable period of time we actually had the one on the right medial ankle healed however this recently opened up again in June. The left medial ankle wound has been a refractory area with some absent flows. We had some success with Hydrofera Blue on this area and it literally closed by 50% however it is recently opened up Foley. Both of these were debridement today of surface eschar. She tolerates this poorly 01/25/15: No change in the status of this. Thick adherent escar. Very poor tolerance of any attempt at debridement. I had healed the right medial malleolus wound for a considerable amount of time and had the left one down to about 50% of the volume although this is totally regressed over the last 48 weeks. Further the right leg has reopened. she is trying to make a appointment with pain and vascular, previous ablations with Dr. Aleda Grana. I do not believe there is an arterial insufficiency  issue here 02/01/15 the status of the adherent eschar bilaterally is actually improved. No debridement was done. She did not manage to get vascular studies done 02/08/15 continued debridement of the area was done today. The slough is less adherent and comes off with less pressure. There is no surrounding infection peripheral pulses are intact 02/15/15 selective debridement with a disposable curette. Again the slough is less adherent and comes off with less difficulty. No surrounding infection peripheral pulses are intact. 02/22/15 selective debridement of the right medial ankle wound. Slough comes off with less difficulty. No obvious surrounding infection peripheral pulses are intact I did not debridement the one on the left. Both of these are stable to improved 03/01/15 selective debridement of both wound areas using a curette to. Adherent slough cup soft with less difficulty. No obvious surrounding infection. The patient tells me that 2 days ago she noted a rash above the right leg wrap. She did not have this on her lower legs when she change this over she arrives with widespread left greater than right almost folliculitis-looking rash which is extremely pruritic. I don't see anything to culture here. There is no rash on the rest of her body. She feels well systemically. 03/08/15; selective debridement of both wounds using a curette. Base of this does not look unhealthy. She had limegreen drainage coming out of the left leg wound and describes a lot of drainage. The rash on her left leg looks improved to. No cultures were done. 03/22/15; patient was not here last week. Basal wounds does not look healthy and there is no surrounding erythema. No drainage. There is still a rash on the left leg that almost looks vasculitic however it is clearly limited to the top of where the wrap would be. 04/05/15; on the right required a surgical debridement of surface eschar and necrotic subcutaneous tissue. I did  not debridement the area on the left. These continue to be large open wounds that are not changing that much. We were successful at one point in healing the area on the right, and at the same time the area on the left was roughly half the size of current measurements. I think a lot of the deterioration has to do with the prolonged time the patient is on her feet at work 04/19/15 I attempted-like surface debridement bilaterally she does not tolerate this. She  tells me that she was in allergic care yesterday with extreme pain over her left lateral malleolus/ankle and was told that she has an "sprain" 05/03/15; large bilateral venous insufficiency wounds over the medial malleolus/medial aspect of her ankles. She complains of copious amounts of drainage and his usual large amounts of pain. There is some increasing erythema around the wound on the right extending into the medial aspect of her foot to. historically she came in with these wounds the right one healed and the left one came down to roughly half its current size however the right one is reopened and the left is expanded. 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 05/10/15 large bilateral venous insufficiency wounds. There is less adherence surface left however the surface culture that I did last week grew pseudomonas therefore bilateral selective debridement score necessary. There is surrounding erythema. The patient describes severe bilateral drainage and a lot of pain in the left ankle. Apparently her podiatrist was were ready to do a cortisone shot 05/17/15; the patient complains of pain and again copious amounts of drainage. 05/24/15; we used Iodo flex last week. Patient notes considerable improvement in wound drainage. Only needed to change this once. 05/31/15; we continued Iodoflex; the base of these large wounds bilaterally is not too bad but there is probably likely a significant  bioburden here. I would like to debridement just doesn't tolerate it. 06/06/14 I would like to continue the Iodoflex although she still hasn't managed to obtain supplies. She has bilateral medial malleoli or large wounds which are mostly superficial. Both of them are covered circumferentially with some nonviable fibrinous slough although she tolerates debridement very poorly. She apparently has an appointment for an ablation on the right leg by interventional radiology. 06/14/15; the patient arrives with the wounds and static condition. We attempted a debridement although she does not do well with this secondary to pain. I 07/05/15; wounds are not much smaller however there appears to be a cleaner granulating base. The left has tight fibrinous slough greater than the right. Debridement is tolerated poorly due to pain. Iodoflex is done more for these wounds in any of the multitude of different dressings I have tried on the left 1 and then subsequently the right. 07/12/15; no change in the condition of this wound. I am able to do an aggressive debridement on the right but not the left. She simply cannot tolerate it. We have been using Iodoflex which helps somewhat. It is worthwhile remembering that at one point we healed the right medial ankle wound and the left was about 25% of the current circumference. We have suggested returning to vascular surgery for review of possible further ablations for one reason or another she has not been able to do this. 07/26/15 no major change in the condition of either wound on her medial ankle. I did not attempt to debridement of these. She has been aggressively scrubbing these while she is in the shower at home. She has her supply of Iodoflex which seems to have done more for these wounds then anything I have put on recently. 08/09/15 wound area appears larger although not verified by measurements. Using Iodoflex 09/05/2015 -- she was here for avisit today but had  significant problems with the wound and I was asked to see her for a physician opinion. I have summarize that this lady has had surgery on her left lower extremity about 10 years  ago where the possible veins stripping was done. She has had an opinion from interventional radiology around November 2016 where no further sclerotherapy was ordered. The patient works 12 hours a day and stands on a concrete floor with work boots and is unable to get the proper compression she requires and cannot elevate her limbs appropriately at any given time. She has recently grown Pseudomonas from her wound culture but has not started her ciprofloxacin which was called in for her. 09/13/15 this continues to be a difficult situation for this patient. At one point I had this wound down to a 1.5 x 1.5" wound on her left leg. This is deteriorated and the right leg has reopened. She now has substantial wounds on her medial calcaneus, malleoli and into her lower leg. One on the left has surface eschar but these are far too painful for me to debridement here. She has a vascular surgery appointment next week to see if anything can be done to help here. I think she has had previous ablations several years ago at [facility]. She has no major edema. She tells me that she did not get product last time [facility]] and went for several days without it. She continues to work in work boots 12 hours a day. She cannot get compression/4-layer under her work boots. 09/20/15 no major change. Periwound edema control was not very good. Her point with pain and vascular is next Wednesday the 25th 09/28/15; the patient is seen vascular surgery and is apparently scheduled for repeat duplex ultrasounds of her bilateral lower legs next week. 10/05/15; the patient was seen by Dr. Doren Custard of vascular surgery. He feels that she should have arterial insufficiency excluded as cause/contributed to her nonhealing stage she is therefore booked for an  arteriogram. She has apparently monophasic signals in the dorsalis pedis pulses. She also of course has known severe chronic venous insufficiency with previous procedures as noted previously. I had another long discussion with the patient today about her continuing to work 12 hour shifts. I've written her out for 2 months area had concerns about this as her work location is currently undergoing significant turmoil and this may lead to her termination. She is aware of this however I agree with her that she simply cannot continue to stand for 12 hours multiple days a week with the substantial wound areas she has. 10/19/15; the Dr. Doren Custard appointment was largely for an arteriogram which was normal. She does not have an arterial issue. He didn't make a comment about her chronic venous insufficiency for which she has had previous ablations. Presumably it was not felt that anything additional could be done. The patient is now out of work as I prescribed 2 weeks ago. Her wounds look somewhat less aggravated presumably because of this. I felt I would give debridement another try today 10/25/15; no major change in this patient's wounds. We are struggling to get her product that she can afford into her own home through her insurance. 11/01/15; no major change in the patient's wounds. I have been using silver alginate as the most affordable product. I spoke to Dr. Marla Roe last week with her requested take her to the OR for surgical debridement and placement of ACEL. Dr. Marla Roe told me that she would be willing to do this however [facility] will not cover this, fortunately the patient has [place] healthcare of some variant 11/08/15; no major change in the patient's wounds. She has been completely nonviable surface that  this but is in too much pain with any attempted debridement are clinic. I have arranged for her to see Dr. Marla Roe ham of plastic surgery and this appointment is on Monday. I am  hopeful that they will take her to the OR for debridement, possible ACEL ultimately possible skin graft 11/22/15 no major change in the patient's wounds over her bilateral medial calcaneus medial malleolus into the lower legs. Surface on these does not look too bad however on the left there is surrounding erythema and tenderness. This may be cellulitis or could him sleepy tinea. 11/29/15; no major changes in the patient's wounds over her bilateral medial malleolus. There is no infection here and I don't think any additional antibiotics are necessary. There is now plan to move forward. She sees Dr. Marla Roe in a week's time for preparation for operative debridement and ACEL placement I believe on 7/12. She then has a follow-up appointment with Dr. Marla Roe on 7/21 12/28/15; the patient returns today having been taken to the [facility] by Dr. Marla Roe 12/12/15 she underwent debridement, intraoperative cultures [which were negative]. She had placement of a wound VAC. Parent really ACEL was not available to be placed. The wound VAC foam apparently adhered to the wound since then she's been using silver alginate, Xeroform under Ace wraps. She still says there is a lot of drainage and a lot of pain 01/31/16; this is a patient I see monthly. I had referred her to Dr. Marla Roe him of plastic surgery for large wounds on her bilateral medial ankles. She has been to the OR twice once in early July and once in early August. She tells me over the last 3 weeks she has been using the wound VAC with ACEL underneath it. On the right we've simply been using silver alginate. Under Kerlix Coban wraps. 02/28/16; this is a patient I'm currently seeing monthly. She is gone on to have a skin graft over her large venous insufficiency ulcer on the left medial ankle. This was done by Dr. Marla Roe him. The patient is a bit perturbed about why she didn't have one on her right medial ankle wound. She has been using silver alginate  to this. 03/06/16; I received a phone call from her plastic surgery Dr. Marla Roe. She expressed some concern about the viability of the skin graft she did on the left medial ankle wound. Asked me to place Endoform on this. She told me she is not planning to do a subsequent skin graft on the right as the left one did not take very well. I had placed Hydrofera Blue on the right 03/13/16; continue to have a reasonably healthy wound on the right medial ankle. Down to 3 mm in terms of size. There is epithelialization here. The area on the left medial ankle is her skin graft site. I suppose the last week this looks somewhat better. She has an open area inferiorly however in the center there appears to be some viable tissue. There is a lot of surface callus and eschar that will eventually need to come off however none of this looked to be infected. Patient states that the is able to keep the dressing on for several days which is an improvement. 03/20/16 no major change in the circumference of either wound however on the left side the patient was at Dr. Eusebio Friendly office and they did a debridement of left wound. 50% of the wound seems to be epithelialized. I been using Endoform on the left Hydrofera Blue in the right  03/27/16; she arrives today with her wound is not looking as healthy as they did last week. The area on the right clearly has an adherent surface to this a very similar surface on the left. Unfortunately for this patient this is all too familiar problem. Clearly the Endoform is not working and will need to change that today that has some potential to help this surface. She does not tolerate debridement in this clinic very well. She is changing the dressing wants 04/03/16; patient arrives with the wounds looking somewhat better especially on the right. Dr. Migdalia Dk change the dressing to silver alginate when she saw her on Monday and also sold her some compression socks. The usefulness of the  latter is really not clear and woman with severely draining wounds. 04/10/16; the patient is doing a bit of an experiment wearing the compression stockings that Dr. Migdalia Dk provided her to her left leg and the out of legs based dressings that we provided to the right. 05/01/16; the patient is continuing to wear compression stockings Dr. Migdalia Dk provided her on the left that are apparently silver impregnated. She has been using Iodoflex to the right leg wound. Still a moderate amount of drainage, when she leaves here the wraps only last for 4 days. She has to change the stocking on the left leg every night 05/15/16; she is now using compression stockings bilaterally provided by Dr. Marla Roe. She is wearing a nonadherent layer over the wounds so really I don't think there is anything specific being done to this now. She has some reduction on the left wound. The right is stable. I think all healing here is being done without a specific dressing 06/09/16; patient arrives here today with not much change in the wound certainly in diameter to large circular wounds over the medial aspect of her ankle bilaterally. Under the light of these services are certainly not viable for healing. There is no evidence of surrounding infection. She is wearing compression stockings with some sort of silver impregnation as prescribed by Dr. Marla Roe. She has a follow-up with her tomorrow. 06/30/16; no major change in the size or condition of her wounds. These are still probably covered with a nonviable surface. She is using only her purchase stockings. She did see Dr. Marla Roe who seemed to want to apply Dakin's solution to this I'm not extreme short what value this would be. I would suggest Iodoflex which she still has at home. 07/28/16; I follow Mrs. Schear episodically along with Dr. Marla Roe. 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 She has been  applying a topical collagen ointment to both wounds with Adaptic. I don't think Dr. Marla Roe is planning to take her back to the OR. 08/19/16; I follow Mrs. Jeneen Rinks on a monthly basis along with Dr. Marla Roe of plastic surgery. 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 I been following her for a number of years. At one point I was able to get the right medial malleolus wound to heal and had the left medial malleolus down to about half its current size however and I had to send her to plastic surgery for an operative debridement. Since then things have been stable to slightly improve the area on the right is slightly better one in the left about the same although there is much less adherent surface than I'm used to with this patient. She is using some form  of liquid collagen gel that Dr. Marla Roe provided a Kerlix cover with the patient's own pressure stockings. She tells me that she has extreme pain in both ankles and along the lateral aspect of both feet. She has been unable to work for some period of time. She is telling me she is retiring at the beginning of April. She sees Dr. Doran Durand of orthopedics next week 09/22/16; patient has not seen Dr. Marla Roe since the last time she is here. I'm not really sure what she is using to the wounds other than bits and pieces of think she had left over including most recently Hydrofera Blue. She is using juxtalite stockings. She is having difficulty with her husband's recent illness "stroke". She is having to transport him to various doctors appointments. Dr. Marla Roe left her the option of a repeat debridement with ACEL however she has not been able to get the time to follow-up on this. She continues to have a fair amount of drainage out of these wounds with certainly precludes leaving dressings on all week 10/13/16; patient has not seen Dr. Marla Roe since she was last in our  clinic. I'm not really sure what she is doing with the wounds, we did try to get her [facility] and I think she is actually using this most of the time. Because of drainage she states she has to change this every second day although this is an improvement from what she used to do. She went to see Dr. Doran Durand who did not think she had a muscular issue with regards to her feet, he referred her to a neurologist and I think the appointment is sometime in June. I changed her back to Iodoflex which she has used in the past but not recently. 11/03/16; the patient has been using Iodoflex although she ran out of this. Still claims that there is a lot of drainage although the wound does not look like this. No surrounding erythema. She has not been back to see Dr. Marla Roe 11/24/16; the patient has been using Iodoflex again but she ran out of it 2 or 3 days ago. There is no major change in the condition of either one of these wounds in fact they are larger and covered in a thick adherent surface slough/nonviable tissue especially on the left. She does not tolerate mechanical debridement in our clinic. Going back to see Dr. Marla Roe of plastic surgery for an operative debridement would seem reasonable. 12/15/16; the patient has not been back to see Dr. Marla Roe. She is been dealing with a series of illnesses and her husband which of monopolized her time. She is been using Sorbact which we largely supplied. She states the drainage is bad enough that it maximum she can go 2-3 days without changing the dressing 01/12/2017 -- the patient has not been back for about 4 weeks and has not seen Dr. Marla Roe not does she have any appointment pending. 01/23/17; patient has not seen Dr. Marla Roe even though I suggested this previously. She is using Santyl that was suggested last week by Dr. Con Memos this Cost her $16 through her insurance which is indeed surprising 02/12/17; continuing Santyl and the patient is  changing this daily. A lot of drainage. She has not been back to see plastic surgery she is using an Ace wrap. Our intake nurse suggested wrap around stockings which would make a good reasonable alternative 02/26/17; patient is been using Santyl and changing this daily due to drainage. She has not been to see plastic  surgery she uses in April Ace wrap to control the edema. She did obtain extremitease stockings but stated that the edema in her leg was to big for these 03/20/17; patient is using Santyl and Anasept. Surfaces looked better today the area on the right is actually measuring a little smaller. She has states she has a lot of pain in her feet and ankles and is asking for a consult to pain control which I'll try to help her with through our case manager. 04/10/17; the patient arrives with better-looking wound surfaces and is slightly smaller wound on the left she is using a combination of Santyl and Anasept. She has an appointment or at least as started in the pain control center associated with [facility] 05/14/17; this is a patient who I followed for a prolonged period of time. 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 At one point I had this down to a much smaller wound on the left however these reopened and we've never been able to get these to heal. She has been using Santyl and Anasept gel although 2 weeks ago she ran out of the Anasept gel. She has a stable appearance of the wound. She is going to the wound care clinic at [facility]. They wanted do a nerve block/spinal block although she tells me she is reluctant to go forward with that. 05/21/17; this is a patient I have followed for many years. 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 Chronic pain and deformity in her ankles as well. She is been to see plastic surgery as well as orthopedics. Using PolyMem AG most recently/Kerramax/ABDs and 2 layer compression. She  has managed to keep this on and she is coming in for a nurse check to change the dressing on Tuesdays, we see her on Fridays 06/05/17; really quite a good looking surface and the area especially on the right medial has contracted in terms of dimensions. Well granulated healthy-looking tissue on both sides. Even with an open curet there is nothing that even feels abnormal here. This is as good as I've seen this in quite some time. We have been using PolyMem AG and bringing her in for a nurse check 06/12/17; really quite good surface on both of these wounds. The right medial has contracted a bit left is not. We've been using PolyMem and AG and she is coming in for a nurse visit 06/19/17; we have been using PolyMem AG and bringing her in for a nurse check. Dimensions of her wounds are not better but the surfaces looked better bilaterally. She complained of bleeding last night and the left wound and increasing pain bilaterally. She states her wound pain is more neuropathic than just the wounds. There was some suggestion that this was radicular from her pain management doctor in talking to her it is really difficult to sort this out. 06/26/17; using PolyMem and AG and bringing her in for a nurse check as All of this and reasonably stable condition. Certainly not improved. The dimensions on the lateral part of the right leg look better but not really measuring better. The medial aspect on the left is about the same. 07/03/16; we have been using PolyMen AG and bringing her in for a nurse check to change the dressings as the wounds have drainage which precludes once weekly changing. We are using all secondary absorptive dressings.our intake nurse is brought up the idea of using a wound VAC/snap VAC on the wound to  help with the drainage to see if this would result in some contraction. This is not a bad idea. The area on the right medial is actually looking smaller. Both wounds have a reasonable-looking surface.  There is no evidence of cellulitis. The edema is well controlled 07/10/17; the patient was denied for a snap VAC by her insurance. The major issue with these wounds continues to be drainage. We are using wicked PolyMem AG and she is coming in for a nurse visit to change this. The wounds are stable to slightly improved. The surface looks vibrant and the area on the right certainly has shrunk in size but very slowly 07/17/17; the patient still has large wounds on her bilateral medial malleoli. Surface of both of these wounds looks better. The dimensions seem to come and go but no consistent improvement. There is no epithelialization. We do not have options for advanced treatment products due to insurance issues. They did not approve of the wound VAC to help control the drainage. More recently we've been using PolyMem and AG wicked to allow drainage through. We have been bringing her in for a nurse visit to change this. We do not have a lot of options for wound care products and the home again due to insurance issues 07/24/17; the patient's wound actually looks somewhat better today. No drainage measurements are smaller still healthy-looking surface. We used silver collagen under PolyMen started last week. We have been bringing her in for a dressing change 07/31/17; patient's wound surface continued to look better and I think there is visible change in the dimensions of the wound on the right. Rims of epithelialization. We have been using silver collagen under PolyMen and bringing her in for a dressing change. There appears to be less drainage although she is still in need of the dressing change 08/07/17. Patient's wound surface continues to look better on both sides and the area on the right is definitely smaller. We have been using silver collagen and PolyMen. She feels that the drainage has been it has been better. I asked her about her vascular status. She went to see Dr. Aleda Grana at [facility]  and had some form of ablation. I don't have much detail on this. I haven't my notes from 2016 that she was not a candidate for any further ablation but I don't have any more information on this. We had referred her to vein and vascular I don't think she ever went. He does not have a history of PAD although I don't have any information on this either. We don't even have ABIs in our record 08/14/17; we've been using silver collagen and PolyMen cover. And putting the patient and compression. She we are bringing her in as a nurse visit to change this because ofarge amount of drainage. We didn't the ABIs in clinic today since they had been done in many moons 1.2 bilaterally. She has been to see vein and vascular however this was at [facility] and she had ablation although I really don't have any information on this all seemed biking get a report. She is also been operatively debrided by plastic surgery and had a cell placed probably 8-12 months ago. This didn't have a major effect. We've been making some gains with current dressings 08/19/17-She is here in follow-up evaluation for bilateral medial malleoli ulcers. She continues to tolerate debridement very poorly. We will continue with recently changed topical treatment; if no significant improvement may consider switching to Iodosorb/Iodoflex. 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 She has been using silver collagen and PolyMem. I believe she has been used and tried on Iodoflex before. 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 These have been very refractory over time. We have been using silver collagen and PolyMen. She is coming in once a week for a doctor's and once a week for nurse visits. We are  actually making some progress 09/18/17; the patient's wounds are smaller especially on the right medial. She arrives today to upset to consider even washing these off with Anasept which I think is been part of the reason this is been closing. We've been using collagen covered in PolyMen otherwise. It is noted that she has a small area of folliculitis on the right medial calf that. As we are wrapping her legs I'll give her a short course of doxycycline to make sure this doesn't amount to anything. She is a long list of complaints today including imbalance, shortness of breath on exertion, inversion of her left ankle. With regards to the latter complaints she is been to see orthopedics and they offered her a tendon release surgery I believe but wanted her wounds to be closed first. I have recommended she go see her primary physician with regards to everything else. 09/25/17; patient's wounds are about the same size. We have made some progress bilaterally although not in recent weeks. She will not allow me T wash these wounds with Anasept even if she is doing her cell. Wheeze we've been using collagen covered in PolyMen. Last week she had a small area of folliculitis this is now opened into a small wound. She completed 5 days of trimethoprim sulfamethoxazole 10/02/17; unfortunately the area on her left medial ankle is worse with a larger wound area towards the Achilles. The patient complains of a lot of pain. She will not allow debridement although visually I don't think there is anything to debridement in any case. We have been using silver collagen and PolyMen for several months now. Initially we are making some progress although I'm not really seeing that today. We will move back to [facility]. His admittedly this is a bit of a repeat however I'm hoping that his situation is different now. The patient tells me she had her leg on the left give out on her yesterday this is process some  pain. 10/09/17; the patient is seen twice a week largely because of drainage issues coming out of the chronic medial bimalleolar wounds that are chronic. Last week the dimensions of the one on the left looks a little larger I changed her to [facility]. She comes in today with a history of terrible pain in the bilateral wound areas. She will not allow debridement. She will not even allow a tissue culture. There is no surrounding erythema no no evidence of cellulitis. We have been putting her Kerlix Coban man. She will not allow more aggressive compression as there was a suggestion to put her in 3 layer wraps. 10/16/17; large wounds on her bilateral medial malleoli. These are chronic. Not much change from last week. The surface looks have healthy but absolutely no epithelialization. A lot of pain little less so of drainage. She will not allow debridement or even washing these off in the vigorous fashion with  Anasept. 10/23/17; large wounds on her bilateral malleoli which are chronic. Some improvement in terms of size perhaps on the right since last time I saw these. She states that after we increased the 3 layer compression there was some bleeding, when she came in for a nurse visit she did not want 3 layer compression put back on about our nurse managed to convince her. She has known chronic venous visit issues and I'm hoping to get her to tolerate the 3 layer compression. using Hydrofera Blue 10/30/17; absolutely no change in the condition of either wound although we've had some improvement in dimensions on the right.. Attempted to put her in 3 layer compression she didn't tolerated she is back in 2 layer compression. We've been using Hydrofera Blue We looked over her past records. She had venous reflux studies in November 2016. There was no evidence of deep venous reflux on the right. Superficial vein did not show the greater saphenous vein at think this is been previously ablated the small  saphenous vein was within normal limits. The left deep venous system showed no DVT the vessels were positive for deep venous reflux in the posterior tibial veins at the ankle. The greater saphenous vein was surgically absent small saphenous vein was within normal limits. She went to vein and vascular at [facility]. I believe she had an ablation on the left greater saphenous vein. I'll update her reflux studies perhaps ever reviewed by vein and vascular. We've made absolutely no progress in these wounds. Will also try to read and TheraSkins through her insurance 11/06/17; W the patient apparently has a 2 week follow-up with vein and vascular I like him to review the whole issue with regards to her previous vascular workup by Dr. Aleda Grana. We've really made no progress on these wounds in many months. She arrives today with less viable looking surface on the left medial ankle wound. This was apparently looking about the same on Tuesday when she was here for nurse visit. 11/13/17; deep tissue culture I did last time of the left lower leg showed multiple organisms without any predominating. In particular no Staphylococcus or group A strep were isolated. We sent her for venous reflux studies. She's had a previous left greater saphenous vein stripping and I think sclerotherapy of the right greater saphenous vein. She didn't really look at the lesser saphenous vein this both wounds are on the medial aspect. She has reflux in the common femoral vein and popliteal vein and an accessory vein on the right and the common femoral vein and popliteal vein on the left. I'm going to have her go to see vein and vascular just the look over things and see if anything else beside aggressive compression is indicated here. We have not been able to make any progress on these wounds in spite of the fact that the surface of the wounds is never look too bad. 11/20/17; no major change in the condition of the wounds. Patient  reports a large amount of drainage. She has a lot of complaints of pain although enlisting her today I wonder if some of this at least his neuropathic rather than secondary to her wounds. She has an appointment with vein and vascular on 12/30/17. The refractory nature of these wounds in my mind at least need vein and vascular to look over the wounds the recent reflux studies we did and her history to see if anything further can be done here. I also note her gait is deteriorated  quite a bit. 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 She has a bilateral Trendelenburg gait. I wonder if this is neuropathic or perhaps multilevel radicular. 11/27/17; her wounds actually looks slightly better. Healthy-looking granulation tissue a scant amount of epithelialization. [place] healthcare will not pay for [employer]. They will play for tri layer Oasis and Dermagraft. This is not a diabetic ulcer. We'll try for the tri layer Oasis. She still complains of some drainage. She has a vein and vascular appointment on 12/30/17 12/04/17; the wounds visually look quite good. Healthy-looking granulation with some degree of epithelialization. We are still waiting for response to our request for trial to try layer Oasis. Her appointment with vascular to review venous and arterial issues isn't sold the end of July 7/31. Not allow debridement or even vigorous cleansing of the wound surface. 12/18/17; slightly smaller especially on the right. 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 We've been using Hydrofera Blue. We still are looking into triple layer Oasis through her insurance 01/08/18 on evaluation today patient's wound actually appears to be showing signs of good improvement at this point in time. She has been tolerating the dressing changes without complication. Fortunately there does not appear to be any evidence of infection at this point in time. We have been utilizing silver nitrate  which does seem to be of benefit for her which is also good news. Overall I'm very happy with how things seem to be both regards appearance as well as measurement. Patient did see Dr. Bridgett Larsson for evaluation on 12/30/17. In his assessment he felt that stripping would not likely add much more than chronic compression to the patient's healing process. His recommendation was to follow-up in three months with Dr. Doren Custard if she hasn't healed in order to consider referral back to you and see vascular where she previously was in a trial and was able to get her wound to heal. I'll be see what she feels she when you staying compression and he reiterated this as well. 01/13/18 on evaluation today patient appears to actually be doing very well in regard to her bilateral medial malleolus ulcers. She seems to have tolerated the chemical cauterization with silver nitrate last week she did have some pain through that evening but fortunately states that I'll be see since it seems to be doing better she is overall pleased with the progress. 01/21/18; really quite a remarkable improvement since I've last seen these wounds. We started using silver nitrate specially on the islands of hyper granulation which for some reason her around the wound circumference. This is really done quite nicely. Primary dressing Hydrofera Blue under 4 layer compression. She seems to be able to hold out without a nurse rewrap. Follow-up in 1 week 01/28/18; we've continued the hydrofera blue but continued with chemical cauterization to the wound area that we started about a month ago for irregular hyper granulation. She is made almost stunning improvement in the overall wound dimensions. I was not really expecting this degree of improvement in these chronic wounds 02/05/18; we continue with Hydrofera Bluebut of also continued the aggressive chemical cauterization with silver nitrate. We made nice progress with the right greater than left  wound. 02/12/18. We continued with Hydrofera Blue after aggressive chemical cauterization with silver nitrate. We appear to be making nice progress with both wound areas 02/19/2018; we continue with [facility] after washing the wounds vigorously with Anasept spray and chemical cauterization with silver nitrate. We are making excellent  progress. The area on the right's just about closed 02/26/2018. The area on the left medial ankle had too much necrotic debris today. I used a #5 curette we are able to get most of the soft. I continued with the silver nitrate to the much smaller wound on the right medial ankle she had a new area on her right lower pretibial area which she says was due to a role in her compression 03/05/2018; both wound areas look healthy. Not much change in dimensions from last week. I continue to use silver nitrate and Hydrofera Blue. The patient saw Dr. Doren Custard of vein and vascular. He felt she had venous stasis ulcers. He felt based on her previous arteriogram she should have adequate circulation for healing. Also she has deep venous reflux but really no significant correctable superficial venous reflux at this time. He felt we should continue with conservative management including leg elevation and compression 04/02/2018; since we last saw this woman about a month ago she had a fall apparently suffered a pelvic fracture. I did not look up the x-ray. Nevertheless because of pain she literally was bedbound for 2 weeks and had home health coming out to change the dressing. Somewhat predictably this is resulted in considerable improvement in both wound areas. The right is just about closed on the medial malleolus and the left is about half the size. 04/16/2018; both her wounds continue to go down in size. Using Hydrofera Blue. 05/07/18; both her wounds appeared to be improving especially on the right where it is almost closed. We are using Hydrofera Blue 05/14/2018; slightly worse  this week with larger wounds. Surface on the left medial not quite as good. We have been using Hydrofera Blue 05/21/18; again the wounds are slightly larger. Left medial malleolus slightly larger with eschar around the circumference. We have been using Hydrofera Blue undergoing a wraps for a prolonged period of time. This got a lot better when she was more recumbent due to a fall and a back injury. I change the primary dressing the silver alginate today. She did not tolerate a 4 layer compression previously although I may need to bring this up with her next time 05/28/2018; area on the left medial malleolus again is slightly larger with more drainage. Area on the right is roughly unchanged. She has a small area of folliculitis on the right medial just on the lower calf. This does not look ominous. 06/03/2018 left medial malleolus slightly smaller in a better looking surface. We used silver nitrate on this last time with silver alginate. The area on the right appears slightly smaller 1/10; left medial malleolus slightly smaller. Small open area on the right. We used silver nitrate and silver alginate as of 2 weeks ago. We continue with the wound and compression. These got a lot better when she was off her feet 1/17; right medial malleolus wound is smaller. The left may be slightly smaller. Both surfaces look somewhat better. 1/24; both wounds are slightly smaller. Using silver alginate under Unna boots 1/31; both wounds appear smaller in fact the area on the right medial is just about closed. Surface eschar. We have been using silver alginate under Unna boots. The patient is less active now spends let much less time on her feet and I think this is contributed to the general improvement in the wound condition 2/7; both wounds appear smaller. I was hopeful the right medial would be closed however there there is still the same small open area. Slight  amount of surface eschar on the left the dimensions  are smaller there is eschar but the wound edges appear to be free. We have been using silver alginate under Unna boot's 2/14; both wounds once again measure smaller. Circumferential eschar on the left medial. We have been using silver alginate under Unna boots with gradual improvement 2/21; the area on the right medial malleolus has healed. The area on the left is smaller. We have been using silver alginate and Unna boots. We can discharge wrapping the right leg she has 20/30 stockings at home she will need to protect the scar tissue in this area 2/28; the area on the right medial malleolus remains closed the patient has a compression stocking. The area on the left is smaller. We have been using silver alginate and Unna boots. 3/6 the area on the right medial ankle remains closed. Good edema control noted she is using her own compression stocking. The area on the left medial ankle is smaller. We have been managing this with silver alginate and Unna boots which we will continue today. 3/13; the area on the right medial ankle remains closed and I'm declaring it healed today. When necessary the left is about the same still a healthy-looking surface but no major change and wound area. No evidence of infection and using silver alginate under unna and generally making considerable improvement 3/27 the area on the right medial ankle remains closed the area on the left is about the same as last week. Certainly not any worse we have been using silver alginate under an Unna boot 4/3; the area on the right medial ankle remains closed per the patient. We did not look at this wound. The wound on the left medial ankle is about the same surface looks healthy we have been using silver alginate under an Unna boot 4/10; area on the right medial ankle remains closed per the patient. We did not look at this wound. The wound on the left medial ankle is slightly larger. The patient complains that the [facility]  caused burning pain all week. She also told us that she was a lot more active this week. Changed her back to silver alginate 4/17; right medial ankle still closed per the patient. Left medial ankle is slightly larger. Using silver alginate. She did not tolerate Hydrofera Blue on this area 4/24; right medial ankle remains closed we have not look at this. The left medial ankle continues to get larger today by about a centimeter. We have been using silver alginate under Unna boots. She complains about 4 layer compression as an alternative. She has been up on her feet working on her garden 5/8; right medial ankle remains closed we did not look at this. The left medial ankle has increased in size about 100%. We have been using silver alginate under Unna boots. She noted increased pain this week and was not surprised that the wound is deteriorated 5/15; no major change in SA however much less erythema ( one week of doxy ocellulitis). 5/22-63 year old female returns at 1 week to the clinic for left medial ankle wound for which we have been using silver alginate under 3 layer compression She was placed on DOXY at last visit - the wound is wider at this visit. She is in 3 layer compression 5/29; change to [facility] last week. I had given her empiric doxycycline 2 weeks ago for a week. She is in 3 layer compression. She complains of a lot of pain and  drainage on presentation today. 6/5; using Hydrofera Blue. I gave her doxycycline recently empirically for erythema and pain around the wound. Believe her cultures showed enterococcus which not would not have been well covered by doxycycline nevertheless the wound looks better and I don't feel specifically that the enterococcus needs to be covered. She has a new what looks like a wrap injury on her lateral left ankle. 6/12; she is using Hydrofera Blue. She has a new area on the left anterior lower tibial area. This was a wrap injury last week. 6/19; the  patient is using Hydrofera Blue. She arrived with marked inflammation and erythema around the wound and tenderness. 12/01/18 on evaluation today patient appears to be doing a little bit better based on what I'm hearing from the standpoint of lassos evaluation to this as far as the overall appearance of the wound is concerned. Then sometime substandard she typically sees Dr. Dellia Nims. Nonetheless overall very pleased with the progress that she's made up to this point. No fevers, chills, nausea, or vomiting noted at this time. 7/10; some improvement in the surface area. Aggressively debrided last week apparently. I went ahead with the debridement today although the patient does not tolerate this very well. We have been using Iodoflex. Still a fair amount of drainage 7/17; slightly smaller. Using Iodoflex. 7/24; no change from last week in terms of surface area. We have been using Iodoflex. Surface looks and continues to look somewhat better 7/31; surface area slightly smaller better looking surface. We have been using Iodoflex. This is under Unna boot compression 8/7-Patient presents at 1 week with Unna boot and Iodoflex, wound appears better 8/14-Patient presents at 1 week with Iodoflex, we use the Unna boot, wound appears to be stable better.Patient is getting Botox treatment for the inversion of the foot for tendon release, Next week 8/21; we are using Iodoflex. Unna boot. The wound is stable in terms of surface area. Under illumination there is some areas of the wound that appear to be either epithelialized or perhaps this is adherent slough at this point I was not really clear. It did not wipe off and I was reluctant to debride this today. 8/28; we are using Iodoflex in an Unna boot. Seems to be making good improvement. 9/4; using Iodoflex and wound is slightly smaller. 9/18; we are using Iodoflex with topical silver nitrate when she is here. The wound continues to be smaller 10/2; patient  missed her appointment last week due to GI issues. She left and Iodoflex based dressing on for 2 weeks. Wound is about the same size about the size of a dime on the left medial lower 10/9 we have been using Iodoflex on the medial left ankle wound. She has a new superficial probable wrap injury on the dorsal left ankle 10/16; we have been using Hydrofera Blue since last week. This is on the left medial ankle 10/23; we have been using Hydrofera Blue since 2 weeks ago. This is on the left medial ankle. Dimensions are better 11/6; using Hydrofera Blue. I think the wound is smaller but still not closed. Left medial ankle 11/13; we have been using Hydrofera Blue. Wound is certainly no smaller this week. Also the surface not as good. This is the remanent of a very large area on her left medial ankle. 11/20; using Sorbact since last week. Wound was about the same in terms of size although I was disappointed about the surface debris 12/11; 3-week follow-up. Patient was on vacation. Wound is  measuring slightly larger we have been using Sorbact. 12/18; wound is about the same size however surface looks better last week after debridement. We have been using Sorbact under compression 12/30 2 week follow up , wound surface appears stable, continuing sorbact Objective Constitutional alert and oriented x 3. sitting or standing blood pressure is within target range for patient.. supine blood pressure is within target range for patient.. pulse regular and within target range for patient.Marland Kitchen respirations regular, non-labored and within target range for patient.Marland Kitchen temperature within target range for patient.. Well-nourished and well-hydrated in no acute distress. Vitals Time Taken: 10:12 AM, Height: 68 in, Weight: 127 lbs, BMI: 19.3, Temperature: 98 F, Pulse: 67 bpm, Respiratory Rate: 18 breaths/min, Blood Pressure: 121/41 mmHg. General Notes: Left Ankle wound with no significant necrotic debris Integumentary  (Hair, Skin) Wound #3 status is Open. Original cause of wound was Gradually Appeared. The wound is located on the Left,Medial Malleolus. The wound measures 2cm length x 1.4cm width x 0.1cm depth; 2.199cm^2 area and 0.22cm^3 volume. There is Fat Layer (Subcutaneous Tissue) Exposed exposed. There is no tunneling or undermining noted. There is a medium amount of serosanguineous drainage noted. The wound margin is distinct with the outline attached to the wound base. There is large (67-100%) red, pink granulation within the wound bed. There is a small (1-33%) amount of necrotic tissue within the wound bed including Adherent Slough. 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 There was a Haematologist Compression Therapy Procedure by [name], RN. Post procedure Diagnosis Wound #3: Same as Pre-Procedure Plan Follow-up Appointments: Return Appointment in 1 week. - Friday Dressing Change Frequency: Wound #3 Left,Medial Malleolus: Do not change entire dressing for one week. - change dressing in one week Skin Barriers/Peri-Wound Care: Moisturizing lotion TCA Cream or Ointment - mixed with lotion Wound Cleansing: Clean wound with Wound Cleanser - clean with anasept with dressing changes May shower with protection. Primary Wound Dressing: Wound #3 Left,Medial Malleolus: Other: - Sorbact. Apply foam to shin for protection Secondary Dressing: Wound #3 Left,Medial Malleolus: Dry Gauze Kerramax Edema Control: Unna Boot to Left Lower Extremity - no kerlix layer Avoid standing for long periods of time Elevate legs to the level of the heart or above for 30 minutes daily and/or when sitting, a frequency of: - throughout the day Support Garment  20-30 mm/Hg pressure to: - compression stocking right leg daily Left medial malleolar wound with continued sorback under compression withUnna boot return in 2 weeks Electronic Signature(s) Signed: 06/17/2019 12:41:03 PM By: Tobi Bastos MD, MBA Entered By: Tobi Bastos on 06/17/2019 12:41:02 -------------------------------------------------------------------------------- SuperBill Details Patient Name: Date of Service: [name] 06/01/2019 Medical Record [id number] Patient Account Number: [id number] Date of Birth/Sex: Treating RN: [birth year] (63 y.o. 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

## 2019-06-01 NOTE — Progress Notes (Addendum)
[name], [name] ([id number]) Visit Report for 06/01/2019 Arrival Information Details Patient Name: Date of Service: [name], [name] 06/01/2019 10:00 AM Medical Record [id number] Patient Account Number: [id number] Date of Birth/Sex: Treating RN: [birth year] (63 y.o. [name] Primary Care Provider: [name] Other Clinician: Referring Provider: Treating Provider/Extender:Madduri, Cherly Beach in Treatment: 5 Visit Information History Since Last Visit Cane All ordered tests and consults were completed: No Patient Arrived: 10:12 Added or deleted any medications: No Arrival Time: Any new allergies or adverse reactions: No Accompanied By: self None Had a fall or experienced change in No Transfer Assistance: activities of daily living that may affect Patient Identification Verified: Yes risk of falls: Secondary Verification Process Completed: Yes Signs or symptoms of abuse/neglect since last No Patient Requires Transmission-Based No visito Precautions: Hospitalized since last visit: No Patient Has Alerts: No Implantable device outside of the clinic excluding No cellular tissue based products placed in the center since last visit: Has Dressing in Place as Prescribed: Yes Has Compression in Place as Prescribed: Yes Pain Present Now: No Electronic Signature(s) Signed: 06/01/2019 5:40:54 PM By: [name] RN Entered By: [name] on 06/01/2019 10:12:50 -------------------------------------------------------------------------------- Compression Therapy Details Patient Name: Date of Service: [name], [name] 06/01/2019 10:00 AM Medical Record [id number] Patient Account Number: [id number] Date of Birth/Sex: Treating RN: [birth year] (63 y.o. [name] Provider: [name] Other Clinician: Referring Provider: Treating Provider/Extender:Madduri, Cherly Beach in Treatment: 337 Compression Therapy  Performed for Wound Wound #3 Left,Medial Malleolus Assessment: Performed By: Jake Church, RN Compression Type: Rolena Infante Post Procedure Diagnosis Same as Pre-procedure Electronic Signature(s) Signed: 06/01/2019 5:40:33 PM By: Baruch Gouty RN, BSN Entered By: Baruch Gouty on 06/01/2019 10:42:49 -------------------------------------------------------------------------------- Encounter Discharge Information Details Patient Name: Date of Service: [name] 06/01/2019 10:00 AM Medical Record [id number] Patient Account Number: [id number] Date of Birth/Sex: Treating RN: 1955-07-16 (63 y.o. [name] Primary Care Provider: [name] Other Clinician: Referring Provider: Treating Provider/Extender:Madduri, Cherly Beach in Treatment: [phone] Encounter Discharge Information Items Discharge Condition: Stable Ambulatory Status: Cane Discharge Destination: Home Transportation: Private Auto Accompanied By: self Schedule Follow-up Appointment: Yes Clinical Summary of Care: Electronic Signature(s) Signed: 06/01/2019 5:32:50 PM By: Deon Pilling Entered By: Deon Pilling on 06/01/2019 11:13:38 -------------------------------------------------------------------------------- Lower Extremity Assessment Details Patient Name: Date of Service: [name], [name] 06/01/2019 10:00 AM Medical Record [id number] Patient Account Number: [id number] Date of Birth/Sex: Treating RN: [birth year] (63 y.o. [name] Primary Care Provider: [name] Other Clinician: Referring Provider: Treating Provider/Extender:Madduri, Cherly Beach in Treatment: 337 Edema Assessment Assessed: [Left: No] [Right: No] Edema: [Left: N] [Right: o] Calf Left: Right: Point of Measurement: 33 cm From Medial Instep 33 cm cm Ankle Left: Right: Point of Measurement: 12 cm From Medial Instep 20.5 cm cm Electronic Signature(s) Signed: 06/01/2019  5:40:54 PM By: [name] RN Entered By: [name] on 06/01/2019 10:23:08 -------------------------------------------------------------------------------- [place] Details Patient Name: Date of Service: [name] 06/01/2019 10:00 AM Medical Record [id number] Patient Account Number: [id number] Date of Birth/Sex: Treating RN: 09/06/1955 (63 y.o. [name] Provider: [name] Other Clinician: Referring Provider: Treating Provider/Extender:Madduri, Cherly Beach in Treatment: [phone] Active Inactive Venous Leg Ulcer Nursing Diagnoses: Actual venous Insuffiency (use after diagnosis is confirmed) Goals: Patient will maintain optimal edema control Date Initiated: 09/04/2017 Target Resolution Date: 06/10/2019 Goal Status: Active Patient/caregiver will verbalize understanding of disease process and disease management Date Initiated: 08/03/2014 Date  Inactivated: 08/28/2017 Target Resolution Date: 08/29/2017 Goal Status: Met Interventions: Compression as ordered Treatment Activities: Therapeutic compression applied : 09/21/2015 Notes: Wound/Skin Impairment Nursing Diagnoses: Impaired tissue integrity Knowledge deficit related to ulceration/compromised skin integrity Goals: Patient will have a decrease in wound volume by X% from date: (specify in notes) Date Initiated: 04/05/2015 Date Inactivated: 02/12/2017 Target Resolution Date: 09/01/2016 Unmet Reason: chronic, Goal Status: Unmet unable to tolerate stronger compression Patient/caregiver will verbalize understanding of skin care regimen Date Initiated: 04/05/2015 Target Resolution Date: 06/10/2019 Goal Status: Active Interventions: Assess patient/caregiver ability to obtain necessary supplies Assess patient/caregiver ability to perform ulcer/skin care regimen upon admission and as needed Assess ulceration(s) every visit Provide education on ulcer and skin  care Notes: Electronic Signature(s) Signed: 06/01/2019 5:40:33 PM By: Baruch Gouty RN, BSN Entered By: Baruch Gouty on 06/01/2019 10:12:58 -------------------------------------------------------------------------------- Pain Assessment Details Patient Name: Date of Service: [name] 06/01/2019 10:00 AM Medical Record [id number] Patient Account Number: [id number] Date of Birth/Sex: Treating RN: December 20, 1955 (63 y.o. [name] Primary Care Provider: [name] Other Clinician: Referring Provider: Treating Provider/Extender:Madduri, Cherly Beach in Treatment: [phone] Active Problems Location of Pain Severity and Description of Pain Patient Has Paino No Site Locations Pain Management and Medication Current Pain Management: Electronic Signature(s) Signed: 06/01/2019 5:40:54 PM By: [name] RN Entered By: [name] on 06/01/2019 10:13:38 -------------------------------------------------------------------------------- Patient/Caregiver Education Details Patient Name: Date of Service: [name] 12/30/2020andnbsp10:00 AM Medical Record Patient Account Number: [id number] Number: Treating RN: Baruch Gouty Date of Birth/Gender: Jun 13, 1955 (62 y.o. Other Clinician: F) Treating Madduri, Odelia Gage Primary Care Physician: [name] Physician/Extender: Referring Physician: Melinda Crutch in Treatment: 337 Education Assessment Education Provided To: Patient Education Topics Provided Venous: Methods: Explain/Verbal Responses: Reinforcements needed, State content correctly Wound/Skin Impairment: Electronic Signature(s) Signed: 06/01/2019 5:40:33 PM By: Baruch Gouty RN, BSN Entered By: Baruch Gouty on 06/01/2019 10:13:32 -------------------------------------------------------------------------------- Wound Assessment Details Patient Name: Date of Service: [name] 06/01/2019 10:00 AM Medical  Record [id number] Patient Account Number: [id number] Date of Birth/Sex: Treating RN: 11-Jul-1955 (63 y.o. [name] Primary Care Provider: [name] Other Clinician: Referring Provider: Treating Provider/Extender:Madduri, Cherly Beach in Treatment: 337 Wound Status Wound Number: 3 Primary Venous Leg Ulcer Etiology: Wound Location: Left Malleolus - Medial Wound Open Wounding Event: Gradually Appeared Status: Date Acquired: 11/15/2012 Date Acquired: 11/15/2012 Comorbid Congestive Heart Failure, Peripheral Vascular Weeks Of Treatment: 337 History: Disease, Congestive Heart Failure, End Clustered Wound: No Stage Renal Disease, Tobacco Use, Chronic Obstructive Pulmonary Disease (COPD), Type 1 Diabetes Photos Wound Measurements Length: (cm) 2 % Reduct Width: (cm) 1.4 % Reduct Depth: (cm) 0.1 Epitheli Area: (cm) 2.199 Tunneli Volume: (cm) 0.22 Undermi Wound Description Classification: Full Thickness Without Exposed Support Foul Od Structures Slough/ Wound Distinct, outline attached Margin: Exudate Medium Amount: Exudate Serosanguineous Type: Exudate red, brown Color: Wound Bed Granulation Amount: Large (67-100%) Granulation Quality: Red, Pink Fascia E Necrotic Amount: Small (1-33%) Fat Laye Necrotic Quality: Adherent Slough Tendon E Muscle E Joint Ex Bone Exp or After Cleansing: No Fibrino Yes Exposed Structure xposed: No r (Subcutaneous Tissue) Exposed: Yes xposed: No xposed: No posed: No osed: No ion in Area: 30% ion in Volume: 65% alization: Small (1-33%) ng: No ning: No Treatment Notes Wound #3 (Left, Medial Malleolus) 1. Cleanse With Wound Cleanser Soap and water 2. Periwound Care TCA Ointment 3. Primary Dressing Applied Other primary dressing (specifiy in notes) 4. Secondary Dressing Dry Gauze Foam Kerramax/Xtrasorb 6. Support Layer [employer] Notes  no kerlix in unna boot. primary dressing sorbact.  foam to anterior lower leg for protection. Electronic Signature(s) Signed: 06/06/2019 4:12:00 PM By: Mikeal Hawthorne EMT/HBOT Signed: 06/06/2019 4:44:52 PM By: [name] RN Previous Signature: 06/01/2019 5:40:54 PM Version By: [name] RN Entered By: Mikeal Hawthorne on 06/06/2019 09:51:34 -------------------------------------------------------------------------------- [place] Details Patient Name: Date of Service: [name] 06/01/2019 10:00 AM Medical Record [id number] Patient Account Number: [id number] Date of Birth/Sex: Treating RN: 14-Jun-1955 (63 y.o. [name] Primary Care Provider: [name] Other Clinician: Referring Provider: Treating Provider/Extender:Madduri, Cherly Beach in Treatment: 337 Vital Signs Time Taken: 10:12 Temperature (F): 98 Height (in): 68 Pulse (bpm): 67 Weight (lbs): 127 Respiratory Rate (breaths/min): 18 Body Mass Index (BMI): 19.3 Blood Pressure (mmHg): 121/41 Reference Range: 80 - 120 mg / dl Electronic Signature(s) Signed: 06/01/2019 5:40:54 PM By: [name] RN Entered By: [name] on 06/01/2019 10:13:17

## 2019-06-02 ENCOUNTER — Encounter: Payer: Self-pay | Admitting: Physical Medicine & Rehabilitation

## 2019-06-02 ENCOUNTER — Other Ambulatory Visit: Payer: Self-pay

## 2019-06-02 ENCOUNTER — Encounter (HOSPITAL_BASED_OUTPATIENT_CLINIC_OR_DEPARTMENT_OTHER): Payer: Medicare Other | Admitting: Physical Medicine & Rehabilitation

## 2019-06-02 VITALS — BP 109/55 | HR 65 | Temp 97.7°F | Ht 68.0 in | Wt 142.0 lb

## 2019-06-02 DIAGNOSIS — R292 Abnormal reflex: Secondary | ICD-10-CM | POA: Diagnosis present

## 2019-06-02 DIAGNOSIS — G249 Dystonia, unspecified: Secondary | ICD-10-CM | POA: Diagnosis not present

## 2019-06-02 DIAGNOSIS — R269 Unspecified abnormalities of gait and mobility: Secondary | ICD-10-CM

## 2019-06-02 DIAGNOSIS — M21542 Acquired clubfoot, left foot: Secondary | ICD-10-CM

## 2019-06-02 NOTE — Progress Notes (Addendum)
Subjective:    Patient ID: [name], female    DOB: [birth year], 63 y.o.   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    Last clinic visit on 03/14/2019.  She had Botox injection on last visit.  Since that time, she notes some benefit in tone.  She denies side effects.   Pain Inventory Average Pain 5 Pain Right Now 5 My pain is sharp, burning and stabbing  In the last 24 hours, has pain interfered with the following? General activity 5 Relation with others 5 Enjoyment of life 7 What TIME of day is your pain at its worst? morning, daytime  Sleep (in general) Fair  Pain is worse with: walking and standing Pain improves with: rest and medication Relief from Meds: 5  Mobility walk with assistance use a cane ability to climb steps?  no do you drive?  yes Do you have any goals in this area?  yes  Function disabled: date disabled . retired  Neuro/Psych trouble walking  Prior Studies Any changes since last visit?  no  Physicians involved in your care Any changes since last visit?  no   Family History  Problem Relation Age of Onset  . Hypertension Mother   . Lung cancer Father   . Diabetes Maternal Grandmother   . Diabetes Paternal Grandmother   . Healthy Brother    Social History   Socioeconomic History  . Marital status: Married    Spouse name: Not on file  . Number of children: 0  . Years of education: 75  . Highest education level: Not on file  Occupational History  . Occupation: retired Glass blower/designer  Tobacco Use  . Smoking status: Former Smoker    Packs/day: 0.25    Years: 26.00    Pack years: 6.50    Types: Cigarettes    Quit date: 04/25/2007    Years since quitting: 12.1  . Smokeless tobacco: Never Used  Substance and Sexual Activity  . Alcohol use: Yes    Alcohol/week: 0.0 standard drinks    Comment: occasionally  . Drug use: No  . Sexual activity:  Yes  Other Topics Concern  . Not on file  Social History Narrative   Patient lives with husband in a one story home. Has no children.  Took early retirement due to medical condition.  Formerly worked as a Glass blower/designer.  Education: 12th.     Social Determinants of Health   Financial Resource Strain:   . Difficulty of Paying Living Expenses: Not on file  Food Insecurity:   . Worried About Charity fundraiser in the Last Year: Not on file  . Ran Out of Food in the Last Year: Not on file  Transportation Needs:   . Lack of Transportation (Medical): Not on file  . Lack of Transportation (Non-Medical): Not on file  Physical Activity:   . Days of Exercise per Week: Not on file  . Minutes of Exercise per Session: Not on file  Stress:   . Feeling of Stress : Not on file  Social Connections:   . Frequency of Communication with Friends and Family: Not on file  . Frequency of Social Gatherings with Friends and Family: Not on file  . Attends Religious Services: Not on file  . Active Member of Clubs or Organizations: Not on file  . Attends Archivist Meetings: Not on file  . Marital Status:  Not on file   Past Surgical History:  Procedure Laterality Date  . APPLICATION OF WOUND VAC Bilateral 12/12/2015   Procedure: APPLICATION OF WOUND VAC;  Surgeon: [name], DO;  Location: [place];  Service: Plastics;  Laterality: Bilateral;  . DRESSING CHANGE UNDER ANESTHESIA Right 02/14/2016   Procedure: DRESSING CHANGE UNDER ANESTHESIA;  Surgeon: [name], DO;  Location: [place];  Service: Plastics;  Laterality: Right;  . [place]  . I & D EXTREMITY Bilateral 12/12/2015   Procedure: DEBRIDEMENT OF BILATERAL LEG WOUNDS;  Surgeon: [name], DO;  Location: [place];  Service: Plastics;  Laterality: Bilateral;  . I & D EXTREMITY Bilateral 01/07/2016   Procedure: IRRIGATION AND DEBRIDEMENT OF  BILATERAL LEGS;  Surgeon: [name], DO;  Location: [place];  Service: Plastics;  Laterality: Bilateral;  . PERIPHERAL VASCULAR CATHETERIZATION N/A 10/15/2015   Procedure: Abdominal Aortogram w/Lower Extremity;  Surgeon: Angelia Mould, MD;  Location: [facility] [facility];  Service: Cardiovascular;  Laterality: N/A;  . RADIOLOGY WITH ANESTHESIA N/A 02/24/2019   Procedure: MIR CERVICAL WITH AND WITHOUT CONTRAST;  Surgeon: Radiologist, Medication, MD;  Location: [place];  Service: Radiology;  Laterality: N/A;  . skin graph    . TONSILLECTOMY     as a child  . VEIN LIGATION AND STRIPPING Left late 1990's  . WOUND DEBRIDEMENT Left 02/14/2016   Procedure: SKIN GRAFT SPLIT THICKNESS TO LEFT ANKLE;  Surgeon: [name], DO;  Location: [place];  Service: Plastics;  Laterality: Left;   Past Medical History:  Diagnosis Date  . Allergy   . Ankle wound 02/2016   bilateral  . Dental bridge present    lower  . Dental crowns present    upper right  . Open wounds involving multiple regions of lower extremity   . Peripheral vascular disease (HCC)    venous stasis ulcers bilateral lower extremities  . PONV (postoperative nausea and vomiting)    BP (!) 109/55   Pulse 65   Temp 97.7 F (36.5 C)   Ht 5\' 8"  (1.727 m)   Wt 142 lb (64.4 kg)   SpO2 97%   BMI 21.59 kg/m   Opioid Risk Score:   Fall Risk Score:  `1  Depression screen PHQ 2/9  Depression screen [facility] 2/9 05/18/2017 04/27/2017 04/07/2017  Decreased Interest 0 0 1  Down, Depressed, Hopeless 0 0 1  PHQ - 2 Score 0 0 2  Altered sleeping - - 2  Tired, decreased energy - - 1  Change in appetite - - 1  Feeling bad or failure about yourself  - - 1  Trouble concentrating - - 1  Moving slowly or fidgety/restless - - 1  Suicidal thoughts - - 0  PHQ-9 Score - - 9  Difficult doing work/chores - - Not difficult at all   Review of Systems  Constitutional: Negative.   HENT: Negative.     Eyes: Negative.   Respiratory: Negative.   Cardiovascular: Negative.   Gastrointestinal: Negative.   Endocrine: Negative.   Genitourinary: Negative.   Musculoskeletal: Positive for gait problem.       Joint deformity  Skin: Negative.   Allergic/Immunologic: Negative.   Hematological: Negative.   Psychiatric/Behavioral: Negative.       Objective:   Physical Exam  Constitutional: No distress . Vital signs reviewed. HENT: Normocephalic.  Atraumatic. Eyes: EOMI. No discharge. Cardiovascular: No JVD.  Respiratory: Normal effort.  No stridor. GI: Non-distended. Skin: RLE with coban dressing Psych: Normal mood.  Normal behavior. 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    1. Left equinovarus with gait abnormality  Cont recs per Dr. Alveta Heimlich  Will increase/change Botox:    Left Med Gastroc:       200   Left Soleus:                 100   Left FHL:                     200

## 2019-06-10 ENCOUNTER — Encounter (HOSPITAL_BASED_OUTPATIENT_CLINIC_OR_DEPARTMENT_OTHER): Payer: Medicare Other | Admitting: Internal Medicine

## 2019-06-17 ENCOUNTER — Encounter (HOSPITAL_BASED_OUTPATIENT_CLINIC_OR_DEPARTMENT_OTHER): Payer: Medicare Other | Admitting: Internal Medicine

## 2019-06-17 ENCOUNTER — Other Ambulatory Visit: Payer: Self-pay

## 2019-06-17 DIAGNOSIS — Z881 Allergy status to other antibiotic agents status: Secondary | ICD-10-CM | POA: Insufficient documentation

## 2019-06-17 DIAGNOSIS — G8929 Other chronic pain: Secondary | ICD-10-CM | POA: Insufficient documentation

## 2019-06-17 DIAGNOSIS — Z880 Allergy status to penicillin: Secondary | ICD-10-CM | POA: Insufficient documentation

## 2019-06-17 DIAGNOSIS — L97322 Non-pressure chronic ulcer of left ankle with fat layer exposed: Secondary | ICD-10-CM | POA: Diagnosis not present

## 2019-06-17 DIAGNOSIS — I83212 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 and inflammation: Secondary | ICD-10-CM | POA: Diagnosis not present

## 2019-06-17 NOTE — Progress Notes (Signed)
[name], [name] ([id number]) Visit Report for 06/17/2019 Debridement Details Patient Name: Date of Service: [name], [name] 06/17/2019 10:00 AM Medical Record [id number] Patient Account Number: [id number] Date of Birth/Sex: Treating RN: July 03, 1955 (64 y.o. F) Primary Care Provider: [name] Other Clinician: Referring Provider: Treating Provider/Extender:Madysen Faircloth, Tawny Asal in Treatment: 340 Debridement Performed for Wound #3 Left,Medial Malleolus Assessment: Performed By: Physician [name]., MD Debridement Type: Debridement Severity of Tissue Pre Fat layer exposed Debridement: Level of Consciousness (Pre- Awake and Alert procedure): Pre-procedure Verification/Time Out Taken: Yes - 11:25 Start Time: 11:29 Pain Control: Other : benzocaine 20% spray Total Area Debrided (L x W): 2.7 (cm) x 1.6 (cm) = 4.32 (cm) Tissue and other material Viable, Non-Viable, Slough, Subcutaneous, Slough debrided: Level: Skin/Subcutaneous Tissue Debridement Description: Excisional Instrument: Curette Bleeding: Minimum Hemostasis Achieved: Pressure End Time: 11:31 Procedural Pain: 7 Post Procedural Pain: 4 Response to Treatment: Procedure was tolerated well Level of Consciousness Awake and Alert (Post-procedure): Post Debridement Measurements of Total Wound Length: (cm) 2.7 Width: (cm) 1.6 Depth: (cm) 0.1 Volume: (cm) 0.339 Character of Wound/Ulcer Post Improved Debridement: Severity of Tissue Post Debridement: Fat layer exposed Post Procedure Diagnosis Same as Pre-procedure Electronic Signature(s) Signed: 06/17/2019 6:20:19 PM By: Linton Ham MD Entered By: Linton Ham on 06/17/2019 12:25:01 -------------------------------------------------------------------------------- HPI Details Patient Name: Date of Service: [name] 06/17/2019 10:00 AM Medical Record [id number] Patient Account Number: [id number] Date of  Birth/Sex: Treating RN: 1955/10/20 (64 y.o. F) Primary Care Provider: [name] Other Clinician: Referring Provider: Treating Provider/Extender:Murrell Dome, Tawny Asal in Treatment: 340 History of Present Illness HPI Description: the remaining wound is over the left medial ankle. Similar wound over the right medial ankle healed largely with use of Apligraf. Most recently we have been using Hydrofera Blue over this wound with considerable improvement. The patient has been extensively worked up in the past for her venous insufficiency and she is not a candidate for antireflux surgery although I have none of the details available currently. 08/24/14; considerable improvement today. About 50% of this wound areas now epithelialized. The base of the wound appears to be healthier granulation.as opposed to last week when she had deteriorated a considerable improvement 08/17/14; unfortunately the wound has regressed somewhat. The areas of epithelialization from the superior aspect are not nearly as healthy as they were last week. The patient thinks her Hydrofera Blue slipped. 09/07/14; unfortunately the area has markedly regressed in the 2 weeks since I've seen this. There is an odor surrounding erythema. The healthy granulation tissue that we had at the base of the wound now is a dusky color. The nurse reports green drainage 09/14/14; the area looks somewhat better than last week. There is less erythema and less drainage. The culture I did did not show any growth. Nevertheless I think it is better to continue the Cipro and doxycycline for a further week. The remaining wound area was debridement. 09/21/14. Wound did not require debridement last week. Still less erythema and less drainage. She can complete her antibiotics. The areas of epithelialization in the superior aspect of the wound do not look as healthy as they did some weeks ago 10/05/14 continued improvement in the condition of  this wound. There is advancing epithelialization. Less aggressive debridement required 10/19/14 continued improvement in the condition and volume of this wound. Less aggressive debridement to the inferior part of this to remove surface slough and fibrinous eschar 11/02/14 no debridement is required. The surface granulation appears healthy although some of  her islands of epithelialization seem to have regressed. No evidence of infection 11/16/14; lites surface debridement done of surface eschar. The wound does not look to be unhealthy. No evidence of infection. Unfortunately the patient has had podiatry issues in the right foot and for some reason has redeveloped small surface ulcerations in the medial right ankle. Her original presentation involved wounds in this area 11/23/14 no debridement. The area on the right ankle has enlarged. The left ankle wound appears stable in terms of the surface although there is periwound inflammation. There has been regression in the amount of new skin 11/30/14 no debridement. Both wound areas appear healthy. There was no evidence of infection. The the new area on the right medial ankle has enlarged although that both the surfaces appear to be stable. 12/07/14; Debridement of the right medial ankle wound. No no debridement was done on the left. 12/14/14 no major change in and now bilateral medial ankle wounds. Both of these are very painful but the no overt evidence of infection. She has had previous venous ablation 12/21/14; patient states that her right medial ankle wound is considerably more painful last week than usual. Her left is also somewhat painful. She could not tolerate debridement. The right medial ankle wound has fibrinous surface eschar 12/28/14 this is a patient with severe bilateral venous insufficiency ulcers. For a considerable period of time we actually had the one on the right medial ankle healed however this recently opened up again in June. The left  medial ankle wound has been a refractory area with some absent flows. We had some success with Hydrofera Blue on this area and it literally closed by 50% however it is recently opened up Foley. Both of these were debridement today of surface eschar. She tolerates this poorly 01/25/15: No change in the status of this. Thick adherent escar. Very poor tolerance of any attempt at debridement. I had healed the right medial malleolus wound for a considerable amount of time and had the left one down to about 50% of the volume although this is totally regressed over the last 48 weeks. Further the right leg has reopened. she is trying to make a appointment with pain and vascular, previous ablations with Dr. Aleda Grana. I do not believe there is an arterial insufficiency issue here 02/01/15 the status of the adherent eschar bilaterally is actually improved. No debridement was done. She did not manage to get vascular studies done 02/08/15 continued debridement of the area was done today. The slough is less adherent and comes off with less pressure. There is no surrounding infection peripheral pulses are intact 02/15/15 selective debridement with a disposable curette. Again the slough is less adherent and comes off with less difficulty. No surrounding infection peripheral pulses are intact. 02/22/15 selective debridement of the right medial ankle wound. Slough comes off with less difficulty. No obvious surrounding infection peripheral pulses are intact I did not debridement the one on the left. Both of these are stable to improved 03/01/15 selective debridement of both wound areas using a curette to. Adherent slough cup soft with less difficulty. No obvious surrounding infection. The patient tells me that 2 days ago she noted a rash above the right leg wrap. She did not have this on her lower legs when she change this over she arrives with widespread left greater than right almost folliculitis-looking rash which  is extremely pruritic. I don't see anything to culture here. There is no rash on the rest of her body. She feels  well systemically. 03/08/15; selective debridement of both wounds using a curette. Base of this does not look unhealthy. She had limegreen drainage coming out of the left leg wound and describes a lot of drainage. The rash on her left leg looks improved to. No cultures were done. 03/22/15; patient was not here last week. Basal wounds does not look healthy and there is no surrounding erythema. No drainage. There is still a rash on the left leg that almost looks vasculitic however it is clearly limited to the top of where the wrap would be. 04/05/15; on the right required a surgical debridement of surface eschar and necrotic subcutaneous tissue. I did not debridement the area on the left. These continue to be large open wounds that are not changing that much. We were successful at one point in healing the area on the right, and at the same time the area on the left was roughly half the size of current measurements. I think a lot of the deterioration has to do with the prolonged time the patient is on her feet at work 04/19/15 I attempted-like surface debridement bilaterally she does not tolerate this. She tells me that she was in allergic care yesterday with extreme pain over her left lateral malleolus/ankle and was told that she has an "sprain" 05/03/15; large bilateral venous insufficiency wounds over the medial malleolus/medial aspect of her ankles. She complains of copious amounts of drainage and his usual large amounts of pain. There is some increasing erythema around the wound on the right extending into the medial aspect of her foot to. historically she came in with these wounds the right one healed and the left one came down to roughly half its current size however the right one is reopened and the left is expanded. 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 05/10/15 large bilateral venous insufficiency wounds. There is less adherence surface left however the surface culture that I did last week grew pseudomonas therefore bilateral selective debridement score necessary. There is surrounding erythema. The patient describes severe bilateral drainage and a lot of pain in the left ankle. Apparently her podiatrist was were ready to do a cortisone shot 05/17/15; the patient complains of pain and again copious amounts of drainage. 05/24/15; we used Iodo flex last week. Patient notes considerable improvement in wound drainage. Only needed to change this once. 05/31/15; we continued Iodoflex; the base of these large wounds bilaterally is not too bad but there is probably likely a significant bioburden here. I would like to debridement just doesn't tolerate it. 06/06/14 I would like to continue the Iodoflex although she still hasn't managed to obtain supplies. She has bilateral medial malleoli or large wounds which are mostly superficial. Both of them are covered circumferentially with some nonviable fibrinous slough although she tolerates debridement very poorly. She apparently has an appointment for an ablation on the right leg by interventional radiology. 06/14/15; the patient arrives with the wounds and static condition. We attempted a debridement although she does not do well with this secondary to pain. I 07/05/15; wounds are not much smaller however there appears to be a cleaner granulating base. The left has tight fibrinous slough greater than the right. Debridement is tolerated poorly due to pain. Iodoflex is done more for these wounds in any of the multitude of different dressings I have tried on the left 1 and then subsequently the right. 07/12/15; no change in  the condition of this wound. I am able to do an aggressive debridement on the right but not the left. She simply cannot tolerate it. We have been using Iodoflex which helps  somewhat. It is worthwhile remembering that at one point we healed the right medial ankle wound and the left was about 25% of the current circumference. We have suggested returning to vascular surgery for review of possible further ablations for one reason or another she has not been able to do this. 07/26/15 no major change in the condition of either wound on her medial ankle. I did not attempt to debridement of these. She has been aggressively scrubbing these while she is in the shower at home. She has her supply of Iodoflex which seems to have done more for these wounds then anything I have put on recently. 08/09/15 wound area appears larger although not verified by measurements. Using Iodoflex 09/05/2015 -- she was here for avisit today but had significant problems with the wound and I was asked to see her for a physician opinion. I have summarize that this lady has had surgery on her left lower extremity about 10 years ago where the possible veins stripping was done. She has had an opinion from interventional radiology around November 2016 where no further sclerotherapy was ordered. The patient works 12 hours a day and stands on a concrete floor with work boots and is unable to get the proper compression she requires and cannot elevate her limbs appropriately at any given time. She has recently grown Pseudomonas from her wound culture but has not started her ciprofloxacin which was called in for her. 09/13/15 this continues to be a difficult situation for this patient. At one point I had this wound down to a 1.5 x 1.5" wound on her left leg. This is deteriorated and the right leg has reopened. She now has substantial wounds on her medial calcaneus, malleoli and into her lower leg. One on the left has surface eschar but these are far too painful for me to debridement here. She has a vascular surgery appointment next week to see if anything can be done to help here. I think she has had previous  ablations several years ago at [facility]. She has no major edema. She tells me that she did not get product last time [facility]] and went for several days without it. She continues to work in work boots 12 hours a day. She cannot get compression/4-layer under her work boots. 09/20/15 no major change. Periwound edema control was not very good. Her point with pain and vascular is next Wednesday the 25th 09/28/15; the patient is seen vascular surgery and is apparently scheduled for repeat duplex ultrasounds of her bilateral lower legs next week. 10/05/15; the patient was seen by Dr. Doren Custard of vascular surgery. He feels that she should have arterial insufficiency excluded as cause/contributed to her nonhealing stage she is therefore booked for an arteriogram. She has apparently monophasic signals in the dorsalis pedis pulses. She also of course has known severe chronic venous insufficiency with previous procedures as noted previously. I had another long discussion with the patient today about her continuing to work 12 hour shifts. I've written her out for 2 months area had concerns about this as her work location is currently undergoing significant turmoil and this may lead to her termination. She is aware of this however I agree with her that she simply cannot continue to stand for 12 hours multiple days a week with  the substantial wound areas she has. 10/19/15; the Dr. Doren Custard appointment was largely for an arteriogram which was normal. She does not have an arterial issue. He didn't make a comment about her chronic venous insufficiency for which she has had previous ablations. Presumably it was not felt that anything additional could be done. The patient is now out of work as I prescribed 2 weeks ago. Her wounds look somewhat less aggravated presumably because of this. I felt I would give debridement another try today 10/25/15; no major change in this patient's wounds. We are struggling to get her  product that she can afford into her own home through her insurance. 11/01/15; no major change in the patient's wounds. I have been using silver alginate as the most affordable product. I spoke to Dr. Marla Roe last week with her requested take her to the OR for surgical debridement and placement of ACEL. Dr. Marla Roe told me that she would be willing to do this however [facility] will not cover this, fortunately the patient has [place] healthcare of some variant 11/08/15; no major change in the patient's wounds. She has been completely nonviable surface that this but is in too much pain with any attempted debridement are clinic. I have arranged for her to see Dr. Marla Roe ham of plastic surgery and this appointment is on Monday. I am hopeful that they will take her to the OR for debridement, possible ACEL ultimately possible skin graft 11/22/15 no major change in the patient's wounds over her bilateral medial calcaneus medial malleolus into the lower legs. Surface on these does not look too bad however on the left there is surrounding erythema and tenderness. This may be cellulitis or could him sleepy tinea. 11/29/15; no major changes in the patient's wounds over her bilateral medial malleolus. There is no infection here and I don't think any additional antibiotics are necessary. There is now plan to move forward. She sees Dr. Marla Roe in a week's time for preparation for operative debridement and ACEL placement I believe on 7/12. She then has a follow-up appointment with Dr. Marla Roe on 7/21 12/28/15; the patient returns today having been taken to the [place] by Dr. Marla Roe 12/12/15 she underwent debridement, intraoperative cultures [which were negative]. She had placement of a wound VAC. Parent really ACEL was not available to be placed. The wound VAC foam apparently adhered to the wound since then she's been using silver alginate, Xeroform under Ace wraps. She still says there is a  lot of drainage and a lot of pain 01/31/16; this is a patient I see monthly. I had referred her to Dr. Marla Roe him of plastic surgery for large wounds on her bilateral medial ankles. She has been to the OR twice once in early July and once in early August. She tells me over the last 3 weeks she has been using the wound VAC with ACEL underneath it. On the right we've simply been using silver alginate. Under Kerlix Coban wraps. 02/28/16; this is a patient I'm currently seeing monthly. She is gone on to have a skin graft over her large venous insufficiency ulcer on the left medial ankle. This was done by Dr. Marla Roe him. The patient is a bit perturbed about why she didn't have one on her right medial ankle wound. She has been using silver alginate to this. 03/06/16; I received a phone call from her plastic surgery Dr. Marla Roe. She expressed some concern about the viability of the skin graft she did on the left  medial ankle wound. Asked me to place Endoform on this. She told me she is not planning to do a subsequent skin graft on the right as the left one did not take very well. I had placed Hydrofera Blue on the right 03/13/16; continue to have a reasonably healthy wound on the right medial ankle. Down to 3 mm in terms of size. There is epithelialization here. The area on the left medial ankle is her skin graft site. I suppose the last week this looks somewhat better. She has an open area inferiorly however in the center there appears to be some viable tissue. There is a lot of surface callus and eschar that will eventually need to come off however none of this looked to be infected. Patient states that the is able to keep the dressing on for several days which is an improvement. 03/20/16 no major change in the circumference of either wound however on the left side the patient was at Dr. Eusebio Friendly office and they did a debridement of left wound. 50% of the wound seems to be epithelialized. I  been using Endoform on the left Hydrofera Blue in the right 03/27/16; she arrives today with her wound is not looking as healthy as they did last week. The area on the right clearly has an adherent surface to this a very similar surface on the left. Unfortunately for this patient this is all too familiar problem. Clearly the Endoform is not working and will need to change that today that has some potential to help this surface. She does not tolerate debridement in this clinic very well. She is changing the dressing wants 04/03/16; patient arrives with the wounds looking somewhat better especially on the right. Dr. Migdalia Dk change the dressing to silver alginate when she saw her on Monday and also sold her some compression socks. The usefulness of the latter is really not clear and woman with severely draining wounds. 04/10/16; the patient is doing a bit of an experiment wearing the compression stockings that Dr. Migdalia Dk provided her to her left leg and the out of legs based dressings that we provided to the right. 05/01/16; the patient is continuing to wear compression stockings Dr. Migdalia Dk provided her on the left that are apparently silver impregnated. She has been using Iodoflex to the right leg wound. Still a moderate amount of drainage, when she leaves here the wraps only last for 4 days. She has to change the stocking on the left leg every night 05/15/16; she is now using compression stockings bilaterally provided by Dr. Marla Roe. She is wearing a nonadherent layer over the wounds so really I don't think there is anything specific being done to this now. She has some reduction on the left wound. The right is stable. I think all healing here is being done without a specific dressing 06/09/16; patient arrives here today with not much change in the wound certainly in diameter to large circular wounds over the medial aspect of her ankle bilaterally. Under the light of these services are certainly not  viable for healing. There is no evidence of surrounding infection. She is wearing compression stockings with some sort of silver impregnation as prescribed by Dr. Marla Roe. She has a follow-up with her tomorrow. 06/30/16; no major change in the size or condition of her wounds. These are still probably covered with a nonviable surface. She is using only her purchase stockings. She did see Dr. Marla Roe who seemed to want to apply Dakin's solution to this I'm  not extreme short what value this would be. I would suggest Iodoflex which she still has at home. 07/28/16; I follow [name] episodically along with Dr. Marla Roe. 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 She has been applying a topical collagen ointment to both wounds with Adaptic. I don't think Dr. Marla Roe is planning to take her back to the OR. 08/19/16; I follow [name] on a monthly basis along with Dr. Marla Roe of plastic surgery. 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 I been following her for a number of years. At one point I was able to get the right medial malleolus wound to heal and had the left medial malleolus down to about half its current size however and I had to send her to plastic surgery for an operative debridement. Since then things have been stable to slightly improve the area on the right is slightly better one in the left about the same although there is much less adherent surface than I'm used to with this patient. She is using some form of liquid collagen gel that Dr. Marla Roe provided a Kerlix cover with the patient's own pressure stockings. She tells me that she has extreme pain in both ankles and along the lateral aspect of both feet. She has been unable to work for some period of time. She is telling me she is retiring at the beginning of April. She sees Dr. Doran Durand of  orthopedics next week 09/22/16; patient has not seen Dr. Marla Roe since the last time she is here. I'm not really sure what she is using to the wounds other than bits and pieces of think she had left over including most recently Hydrofera Blue. She is using juxtalite stockings. She is having difficulty with her husband's recent illness "stroke". She is having to transport him to various doctors appointments. Dr. Marla Roe left her the option of a repeat debridement with ACEL however she has not been able to get the time to follow-up on this. She continues to have a fair amount of drainage out of these wounds with certainly precludes leaving dressings on all week 10/13/16; patient has not seen Dr. Marla Roe since she was last in our clinic. I'm not really sure what she is doing with the wounds, we did try to get her [facility] and I think she is actually using this most of the time. Because of drainage she states she has to change this every second day although this is an improvement from what she used to do. She went to see Dr. Doran Durand who did not think she had a muscular issue with regards to her feet, he referred her to a neurologist and I think the appointment is sometime in June. I changed her back to Iodoflex which she has used in the past but not recently. 11/03/16; the patient has been using Iodoflex although she ran out of this. Still claims that there is a lot of drainage although the wound does not look like this. No surrounding erythema. She has not been back to see Dr. Marla Roe 11/24/16; the patient has been using Iodoflex again but she ran out of it 2 or 3 days ago. There is no major change in the condition of either one of these wounds in fact they are larger and covered in a thick adherent surface slough/nonviable tissue especially on the left. She does not tolerate mechanical debridement in our  clinic. Going back to see Dr. Marla Roe of plastic surgery for an operative  debridement would seem reasonable. 12/15/16; the patient has not been back to see Dr. Marla Roe. She is been dealing with a series of illnesses and her husband which of monopolized her time. She is been using Sorbact which we largely supplied. She states the drainage is bad enough that it maximum she can go 2-3 days without changing the dressing 01/12/2017 -- the patient has not been back for about 4 weeks and has not seen Dr. Marla Roe not does she have any appointment pending. 01/23/17; patient has not seen Dr. Marla Roe even though I suggested this previously. She is using Santyl that was suggested last week by Dr. Con Memos this Cost her $16 through her insurance which is indeed surprising 02/12/17; continuing Santyl and the patient is changing this daily. A lot of drainage. She has not been back to see plastic surgery she is using an Ace wrap. Our intake nurse suggested wrap around stockings which would make a good reasonable alternative 02/26/17; patient is been using Santyl and changing this daily due to drainage. She has not been to see plastic surgery she uses in April Ace wrap to control the edema. She did obtain extremitease stockings but stated that the edema in her leg was to big for these 03/20/17; patient is using Santyl and Anasept. Surfaces looked better today the area on the right is actually measuring a little smaller. She has states she has a lot of pain in her feet and ankles and is asking for a consult to pain control which I'll try to help her with through our case manager. 04/10/17; the patient arrives with better-looking wound surfaces and is slightly smaller wound on the left she is using a combination of Santyl and Anasept. She has an appointment or at least as started in the pain control center associated with [facility] 05/14/17; this is a patient who I followed for a prolonged period of time. 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 At  one point I had this down to a much smaller wound on the left however these reopened and we've never been able to get these to heal. She has been using Santyl and Anasept gel although 2 weeks ago she ran out of the Anasept gel. She has a stable appearance of the wound. She is going to the wound care clinic at [facility]. They wanted do a nerve block/spinal block although she tells me she is reluctant to go forward with that. 05/21/17; this is a patient I have followed for many years. 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 Chronic pain and deformity in her ankles as well. She is been to see plastic surgery as well as orthopedics. Using PolyMem AG most recently/Kerramax/ABDs and 2 layer compression. She has managed to keep this on and she is coming in for a nurse check to change the dressing on Tuesdays, we see her on Fridays 06/05/17; really quite a good looking surface and the area especially on the right medial has contracted in terms of dimensions. Well granulated healthy-looking tissue on both sides. Even with an open curet there is nothing that even feels abnormal here. This is as good as I've seen this in quite some time. We have been using PolyMem AG and bringing her in for a nurse check 06/12/17; really quite good surface on both of these wounds. The right medial has contracted a bit left is not.  We've been using PolyMem and AG and she is coming in for a nurse visit 06/19/17; we have been using PolyMem AG and bringing her in for a nurse check. Dimensions of her wounds are not better but the surfaces looked better bilaterally. She complained of bleeding last night and the left wound and increasing pain bilaterally. She states her wound pain is more neuropathic than just the wounds. There was some suggestion that this was radicular from her pain management doctor in talking to her it is really difficult to sort this out. 06/26/17; using PolyMem and AG and  bringing her in for a nurse check as All of this and reasonably stable condition. Certainly not improved. The dimensions on the lateral part of the right leg look better but not really measuring better. The medial aspect on the left is about the same. 07/03/16; we have been using PolyMen AG and bringing her in for a nurse check to change the dressings as the wounds have drainage which precludes once weekly changing. We are using all secondary absorptive dressings.our intake nurse is brought up the idea of using a wound VAC/snap VAC on the wound to help with the drainage to see if this would result in some contraction. This is not a bad idea. The area on the right medial is actually looking smaller. Both wounds have a reasonable-looking surface. There is no evidence of cellulitis. The edema is well controlled 07/10/17; the patient was denied for a snap VAC by her insurance. The major issue with these wounds continues to be drainage. We are using wicked PolyMem AG and she is coming in for a nurse visit to change this. The wounds are stable to slightly improved. The surface looks vibrant and the area on the right certainly has shrunk in size but very slowly 07/17/17; the patient still has large wounds on her bilateral medial malleoli. Surface of both of these wounds looks better. The dimensions seem to come and go but no consistent improvement. There is no epithelialization. We do not have options for advanced treatment products due to insurance issues. They did not approve of the wound VAC to help control the drainage. More recently we've been using PolyMem and AG wicked to allow drainage through. We have been bringing her in for a nurse visit to change this. We do not have a lot of options for wound care products and the home again due to insurance issues 07/24/17; the patient's wound actually looks somewhat better today. No drainage measurements are smaller still healthy-looking surface. We used silver  collagen under PolyMen started last week. We have been bringing her in for a dressing change 07/31/17; patient's wound surface continued to look better and I think there is visible change in the dimensions of the wound on the right. Rims of epithelialization. We have been using silver collagen under PolyMen and bringing her in for a dressing change. There appears to be less drainage although she is still in need of the dressing change 08/07/17. Patient's wound surface continues to look better on both sides and the area on the right is definitely smaller. We have been using silver collagen and PolyMen. She feels that the drainage has been it has been better. I asked her about her vascular status. She went to see Dr. Aleda Grana at [facility] and had some form of ablation. I don't have much detail on this. I haven't my notes from 2016 that she was not a candidate for any further ablation but I don't  have any more information on this. We had referred her to vein and vascular I don't think she ever went. He does not have a history of PAD although I don't have any information on this either. We don't even have ABIs in our record 08/14/17; we've been using silver collagen and PolyMen cover. And putting the patient and compression. She we are bringing her in as a nurse visit to change this because ofarge amount of drainage. We didn't the ABIs in clinic today since they had been done in many moons 1.2 bilaterally. She has been to see vein and vascular however this was at [facility] and she had ablation although I really don't have any information on this all seemed biking get a report. She is also been operatively debrided by plastic surgery and had a cell placed probably 8-12 months ago. This didn't have a major effect. We've been making some gains with current dressings 08/19/17-She is here in follow-up evaluation for bilateral medial malleoli ulcers. She continues to tolerate debridement very poorly.  We will continue with recently changed topical treatment; if no significant improvement may consider switching to Iodosorb/Iodoflex. 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 She has been using silver collagen and PolyMem. I believe she has been used and tried on Iodoflex before. 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 These have been very refractory over time. We have been using silver collagen and PolyMen. She is coming in once a week for a doctor's and once a week for nurse visits. We are actually making some progress 09/18/17; the patient's wounds are smaller especially on the right medial. She arrives today to upset to consider even washing these off with Anasept which I think is been part of the reason this is been closing. We've been using collagen covered in PolyMen otherwise. It is noted that she has a small area of folliculitis on the right medial calf that. As we are wrapping her legs I'll give her a short course of doxycycline to make sure this doesn't amount to anything. She is a long list of complaints today including imbalance, shortness of breath on exertion, inversion of her left ankle. With regards to the latter complaints she is been to see orthopedics and they offered her a tendon release surgery I believe but wanted her wounds to be closed first. I have recommended she go see her primary physician with regards to everything else. 09/25/17; patient's wounds are about the same size. We have made some progress bilaterally although not in recent weeks. She will not allow me T wash these wounds with Anasept even if she is doing her cell. Wheeze we've been using collagen covered in PolyMen. Last week she had a small area of folliculitis this  is now opened into a small wound. She completed 5 days of trimethoprim sulfamethoxazole 10/02/17; unfortunately the area on her left medial ankle is worse with a larger wound area towards the Achilles. The patient complains of a lot of pain. She will not allow debridement although visually I don't think there is anything to debridement in any case. We have been using silver collagen and PolyMen for several months now. Initially we are making some progress although I'm not really seeing that today. We will move back to [facility].  His admittedly this is a bit of a repeat however I'm hoping that his situation is different now. The patient tells me she had her leg on the left give out on her yesterday this is process some pain. 10/09/17; the patient is seen twice a week largely because of drainage issues coming out of the chronic medial bimalleolar wounds that are chronic. Last week the dimensions of the one on the left looks a little larger I changed her to [facility]. She comes in today with a history of terrible pain in the bilateral wound areas. She will not allow debridement. She will not even allow a tissue culture. There is no surrounding erythema no no evidence of cellulitis. We have been putting her Kerlix Coban man. She will not allow more aggressive compression as there was a suggestion to put her in 3 layer wraps. 10/16/17; large wounds on her bilateral medial malleoli. These are chronic. Not much change from last week. The surface looks have healthy but absolutely no epithelialization. A lot of pain little less so of drainage. She will not allow debridement or even washing these off in the vigorous fashion with Anasept. 10/23/17; large wounds on her bilateral malleoli which are chronic. Some improvement in terms of size perhaps on the right since last time I saw these. She states that after we increased the 3 layer compression there was some bleeding, when she came in for a nurse  visit she did not want 3 layer compression put back on about our nurse managed to convince her. She has known chronic venous visit issues and I'm hoping to get her to tolerate the 3 layer compression. using Hydrofera Blue 10/30/17; absolutely no change in the condition of either wound although we've had some improvement in dimensions on the right.. Attempted to put her in 3 layer compression she didn't tolerated she is back in 2 layer compression. We've been using Hydrofera Blue We looked over her past records. She had venous reflux studies in November 2016. There was no evidence of deep venous reflux on the right. Superficial vein did not show the greater saphenous vein at think this is been previously ablated the small saphenous vein was within normal limits. The left deep venous system showed no DVT the vessels were positive for deep venous reflux in the posterior tibial veins at the ankle. The greater saphenous vein was surgically absent small saphenous vein was within normal limits. She went to vein and vascular at [facility]. I believe she had an ablation on the left greater saphenous vein. I'll update her reflux studies perhaps ever reviewed by vein and vascular. We've made absolutely no progress in these wounds. Will also try to read and TheraSkins through her insurance 11/06/17; W the patient apparently has a 2 week follow-up with vein and vascular I like him to review the whole issue with regards to her previous vascular workup by Dr. Aleda Grana. We've really made no progress on these wounds in many months. She arrives today with less viable looking surface on the left medial ankle wound. This was apparently looking about the same on Tuesday when she was here for nurse visit. 11/13/17; deep tissue culture I did last time of the left lower leg showed multiple organisms without any predominating. In particular no Staphylococcus or group A strep were isolated. We sent her for venous  reflux studies. She's had a previous left greater saphenous vein stripping and I think sclerotherapy of the right greater saphenous vein. She didn't really  look at the lesser saphenous vein this both wounds are on the medial aspect. She has reflux in the common femoral vein and popliteal vein and an accessory vein on the right and the common femoral vein and popliteal vein on the left. I'm going to have her go to see vein and vascular just the look over things and see if anything else beside aggressive compression is indicated here. We have not been able to make any progress on these wounds in spite of the fact that the surface of the wounds is never look too bad. 11/20/17; no major change in the condition of the wounds. Patient reports a large amount of drainage. She has a lot of complaints of pain although enlisting her today I wonder if some of this at least his neuropathic rather than secondary to her wounds. She has an appointment with vein and vascular on 12/30/17. The refractory nature of these wounds in my mind at least need vein and vascular to look over the wounds the recent reflux studies we did and her history to see if anything further can be done here. I also note her gait is deteriorated quite a bit. 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 She has a bilateral Trendelenburg gait. I wonder if this is neuropathic or perhaps multilevel radicular. 11/27/17; her wounds actually looks slightly better. Healthy-looking granulation tissue a scant amount of epithelialization. [place] healthcare will not pay for [employer]. They will play for tri layer Oasis and Dermagraft. This is not a diabetic ulcer. We'll try for the tri layer Oasis. She still complains of some drainage. She has a vein and vascular appointment on 12/30/17 12/04/17; the wounds visually look quite good. Healthy-looking granulation with some degree of epithelialization. We are still waiting for response to our request  for trial to try layer Oasis. Her appointment with vascular to review venous and arterial issues isn't sold the end of July 7/31. Not allow debridement or even vigorous cleansing of the wound surface. 12/18/17; slightly smaller especially on the right. 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 We've been using Hydrofera Blue. We still are looking into triple layer Oasis through her insurance 01/08/18 on evaluation today patient's wound actually appears to be showing signs of good improvement at this point in time. She has been tolerating the dressing changes without complication. Fortunately there does not appear to be any evidence of infection at this point in time. We have been utilizing silver nitrate which does seem to be of benefit for her which is also good news. Overall I'm very happy with how things seem to be both regards appearance as well as measurement. Patient did see Dr. Bridgett Larsson for evaluation on 12/30/17. In his assessment he felt that stripping would not likely add much more than chronic compression to the patient's healing process. His recommendation was to follow-up in three months with Dr. Doren Custard if she hasn't healed in order to consider referral back to you and see vascular where she previously was in a trial and was able to get her wound to heal. I'll be see what she feels she when you staying compression and he reiterated this as well. 01/13/18 on evaluation today patient appears to actually be doing very well in regard to her bilateral medial malleolus ulcers. She seems to have tolerated the chemical cauterization with silver nitrate last week she did have some pain through that evening but fortunately states that I'll be see since it seems to be  doing better she is overall pleased with the progress. 01/21/18; really quite a remarkable improvement since I've last seen these wounds. We started using silver nitrate specially on the islands of hyper  granulation which for some reason her around the wound circumference. This is really done quite nicely. Primary dressing Hydrofera Blue under 4 layer compression. She seems to be able to hold out without a nurse rewrap. Follow-up in 1 week 01/28/18; we've continued the hydrofera blue but continued with chemical cauterization to the wound area that we started about a month ago for irregular hyper granulation. She is made almost stunning improvement in the overall wound dimensions. I was not really expecting this degree of improvement in these chronic wounds 02/05/18; we continue with Hydrofera Bluebut of also continued the aggressive chemical cauterization with silver nitrate. We made nice progress with the right greater than left wound. 02/12/18. We continued with Hydrofera Blue after aggressive chemical cauterization with silver nitrate. We appear to be making nice progress with both wound areas 02/19/2018; we continue with [facility] after washing the wounds vigorously with Anasept spray and chemical cauterization with silver nitrate. We are making excellent progress. The area on the right's just about closed 02/26/2018. The area on the left medial ankle had too much necrotic debris today. I used a #5 curette we are able to get most of the soft. I continued with the silver nitrate to the much smaller wound on the right medial ankle she had a new area on her right lower pretibial area which she says was due to a role in her compression 03/05/2018; both wound areas look healthy. Not much change in dimensions from last week. I continue to use silver nitrate and Hydrofera Blue. The patient saw Dr. Doren Custard of vein and vascular. He felt she had venous stasis ulcers. He felt based on her previous arteriogram she should have adequate circulation for healing. Also she has deep venous reflux but really no significant correctable superficial venous reflux at this time. He felt we should continue with  conservative management including leg elevation and compression 04/02/2018; since we last saw this woman about a month ago she had a fall apparently suffered a pelvic fracture. I did not look up the x-ray. Nevertheless because of pain she literally was bedbound for 2 weeks and had home health coming out to change the dressing. Somewhat predictably this is resulted in considerable improvement in both wound areas. The right is just about closed on the medial malleolus and the left is about half the size. 04/16/2018; both her wounds continue to go down in size. Using Hydrofera Blue. 05/07/18; both her wounds appeared to be improving especially on the right where it is almost closed. We are using Hydrofera Blue 05/14/2018; slightly worse this week with larger wounds. Surface on the left medial not quite as good. We have been using Hydrofera Blue 05/21/18; again the wounds are slightly larger. Left medial malleolus slightly larger with eschar around the circumference. We have been using Hydrofera Blue undergoing a wraps for a prolonged period of time. This got a lot better when she was more recumbent due to a fall and a back injury. I change the primary dressing the silver alginate today. She did not tolerate a 4 layer compression previously although I may need to bring this up with her next time 05/28/2018; area on the left medial malleolus again is slightly larger with more drainage. Area on the right is roughly unchanged. She has a small area of  folliculitis on the right medial just on the lower calf. This does not look ominous. 06/03/2018 left medial malleolus slightly smaller in a better looking surface. We used silver nitrate on this last time with silver alginate. The area on the right appears slightly smaller 1/10; left medial malleolus slightly smaller. Small open area on the right. We used silver nitrate and silver alginate as of 2 weeks ago. We continue with the wound and compression. These  got a lot better when she was off her feet 1/17; right medial malleolus wound is smaller. The left may be slightly smaller. Both surfaces look somewhat better. 1/24; both wounds are slightly smaller. Using silver alginate under Unna boots 1/31; both wounds appear smaller in fact the area on the right medial is just about closed. Surface eschar. We have been using silver alginate under Unna boots. The patient is less active now spends let much less time on her feet and I think this is contributed to the general improvement in the wound condition 2/7; both wounds appear smaller. I was hopeful the right medial would be closed however there there is still the same small open area. Slight amount of surface eschar on the left the dimensions are smaller there is eschar but the wound edges appear to be free. We have been using silver alginate under Unna boot's 2/14; both wounds once again measure smaller. Circumferential eschar on the left medial. We have been using silver alginate under Unna boots with gradual improvement 2/21; the area on the right medial malleolus has healed. The area on the left is smaller. We have been using silver alginate and Unna boots. We can discharge wrapping the right leg she has 20/30 stockings at home she will need to protect the scar tissue in this area 2/28; the area on the right medial malleolus remains closed the patient has a compression stocking. The area on the left is smaller. We have been using silver alginate and Unna boots. 3/6 the area on the right medial ankle remains closed. Good edema control noted she is using her own compression stocking. The area on the left medial ankle is smaller. We have been managing this with silver alginate and Unna boots which we will continue today. 3/13; the area on the right medial ankle remains closed and I'm declaring it healed today. When necessary the left is about the same still a healthy-looking surface but no major change  and wound area. No evidence of infection and using silver alginate under unna and generally making considerable improvement 3/27 the area on the right medial ankle remains closed the area on the left is about the same as last week. Certainly not any worse we have been using silver alginate under an Unna boot 4/3; the area on the right medial ankle remains closed per the patient. We did not look at this wound. The wound on the left medial ankle is about the same surface looks healthy we have been using silver alginate under an Unna boot 4/10; area on the right medial ankle remains closed per the patient. We did not look at this wound. The wound on the left medial ankle is slightly larger. The patient complains that the [facility] caused burning pain all week. She also told us that she was a lot more active this week. Changed her back to silver alginate 4/17; right medial ankle still closed per the patient. Left medial ankle is slightly larger. Using silver alginate. She did not tolerate Hydrofera Blue on this  area 4/24; right medial ankle remains closed we have not look at this. The left medial ankle continues to get larger today by about a centimeter. We have been using silver alginate under Unna boots. She complains about 4 layer compression as an alternative. She has been up on her feet working on her garden 5/8; right medial ankle remains closed we did not look at this. The left medial ankle has increased in size about 100%. We have been using silver alginate under Unna boots. She noted increased pain this week and was not surprised that the wound is deteriorated 5/15; no major change in SA however much less erythema ( one week of doxy ocellulitis). 5/22-65 year old female returns at 1 week to the clinic for left medial ankle wound for which we have been using silver alginate under 3 layer compression She was placed on DOXY at last visit - the wound is wider at this visit. She is in 3  layer compression 5/29; change to [facility] last week. I had given her empiric doxycycline 2 weeks ago for a week. She is in 3 layer compression. She complains of a lot of pain and drainage on presentation today. 6/5; using Hydrofera Blue. I gave her doxycycline recently empirically for erythema and pain around the wound. Believe her cultures showed enterococcus which not would not have been well covered by doxycycline nevertheless the wound looks better and I don't feel specifically that the enterococcus needs to be covered. She has a new what looks like a wrap injury on her lateral left ankle. 6/12; she is using Hydrofera Blue. She has a new area on the left anterior lower tibial area. This was a wrap injury last week. 6/19; the patient is using Hydrofera Blue. She arrived with marked inflammation and erythema around the wound and tenderness. 12/01/18 on evaluation today patient appears to be doing a little bit better based on what I'm hearing from the standpoint of lassos evaluation to this as far as the overall appearance of the wound is concerned. Then sometime substandard she typically sees Dr. Dellia Nims. Nonetheless overall very pleased with the progress that she's made up to this point. No fevers, chills, nausea, or vomiting noted at this time. 7/10; some improvement in the surface area. Aggressively debrided last week apparently. I went ahead with the debridement today although the patient does not tolerate this very well. We have been using Iodoflex. Still a fair amount of drainage 7/17; slightly smaller. Using Iodoflex. 7/24; no change from last week in terms of surface area. We have been using Iodoflex. Surface looks and continues to look somewhat better 7/31; surface area slightly smaller better looking surface. We have been using Iodoflex. This is under Unna boot compression 8/7-Patient presents at 1 week with Unna boot and Iodoflex, wound appears better 8/14-Patient presents at  1 week with Iodoflex, we use the Unna boot, wound appears to be stable better.Patient is getting Botox treatment for the inversion of the foot for tendon release, Next week 8/21; we are using Iodoflex. Unna boot. The wound is stable in terms of surface area. Under illumination there is some areas of the wound that appear to be either epithelialized or perhaps this is adherent slough at this point I was not really clear. It did not wipe off and I was reluctant to debride this today. 8/28; we are using Iodoflex in an Unna boot. Seems to be making good improvement. 9/4; using Iodoflex and wound is slightly smaller. 9/18; we are using Iodoflex  with topical silver nitrate when she is here. The wound continues to be smaller 10/2; patient missed her appointment last week due to GI issues. She left and Iodoflex based dressing on for 2 weeks. Wound is about the same size about the size of a dime on the left medial lower 10/9 we have been using Iodoflex on the medial left ankle wound. She has a new superficial probable wrap injury on the dorsal left ankle 10/16; we have been using Hydrofera Blue since last week. This is on the left medial ankle 10/23; we have been using Hydrofera Blue since 2 weeks ago. This is on the left medial ankle. Dimensions are better 11/6; using Hydrofera Blue. I think the wound is smaller but still not closed. Left medial ankle 11/13; we have been using Hydrofera Blue. Wound is certainly no smaller this week. Also the surface not as good. This is the remanent of a very large area on her left medial ankle. 11/20; using Sorbact since last week. Wound was about the same in terms of size although I was disappointed about the surface debris 12/11; 3-week follow-up. Patient was on vacation. Wound is measuring slightly larger we have been using Sorbact. 12/18; wound is about the same size however surface looks better last week after debridement. We have been using Sorbact under  compression 1/15 wound is probably twice the size of last time increased in length nonviable surface. We have been using Sorbact. She was running a mild fever and missed her appointment last week Electronic Signature(s) Signed: 06/17/2019 6:20:19 PM By: Linton Ham MD Entered By: Linton Ham on 06/17/2019 12:25:33 -------------------------------------------------------------------------------- Physical Exam Details Patient Name: Date of Service: [name] 06/17/2019 10:00 AM Medical Record [id number] Patient Account Number: [id number] Date of Birth/Sex: Treating RN: 1955-07-19 (64 y.o. F) Primary Care Provider: [name] Other Clinician: Referring Provider: Treating Provider/Extender:Ilsa Bonello, Velva Harman, Essie Christine in Treatment: 340 Constitutional Wide pulse pressure but looks well. Pulse regular and within target range for patient.Marland Kitchen Respirations regular, non- labored and within target range.. Temperature is normal and within the target range for the patient.Marland Kitchen Appears in no distress. Notes Wound exam; left medial malleolus. Nonviable surface debrided with a #5 curette. Hemostasis with direct pressure Electronic Signature(s) Signed: 06/17/2019 6:20:19 PM By: Linton Ham MD Entered By: Linton Ham on 06/17/2019 12:26:22 -------------------------------------------------------------------------------- Physician Orders Details Patient Name: Date of Service: [name] 06/17/2019 10:00 AM Medical Record [id number] Patient Account Number: [id number] Date of Birth/Sex: Treating RN: [birth year] (64 y.o. 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 - Friday Dressing Change Frequency Wound #3 Left,Medial Malleolus Do not change entire dressing for one week. - change dressing in one week Skin Barriers/Peri-Wound Care Moisturizing lotion TCA Cream or Ointment - mixed with lotion Wound Cleansing Clean wound with Wound Cleanser - clean with anasept with dressing changes May shower with protection. Primary Wound Dressing Wound #3 Left,Medial Malleolus Hydrofera Blue - classic Secondary Dressing Wound #3 Left,Medial Malleolus Dry Gauze Kerramax Edema Control Unna Boot to Left Lower Extremity - no kerlix layer Avoid standing for long periods of time Elevate legs to the level of the heart or above for 30 minutes  daily and/or when sitting, a frequency of: - throughout the day Support Garment 20-30 mm/Hg pressure to: - compression stocking right leg daily Patient Medications Allergies: penicillin, doxycycline Notifications Medication Indication Start End benzocaine prior to 06/17/2019 debridement DOSE topical 20 % aerosol - aerosol topical Electronic Signature(s) Signed: 06/17/2019 6:20:19 PM By: Linton Ham MD Signed: 06/17/2019 6:26:16 PM By: Baruch Gouty RN, BSN Entered By: Baruch Gouty on 06/17/2019 11:34:26 -------------------------------------------------------------------------------- Problem List Details Patient Name: Date of Service: [name] 06/17/2019 10:00 AM Medical Record [id number] Patient Account Number: [id number] Date of Birth/Sex: Treating RN: 03/23/1956 (64 y.o. 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 08/03/2014 08/03/2014 and inflammation L03.116 Cellulitis of left lower limb 09/07/2014 09/07/2014 L97.321 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 F) Primary Care Provider: [name] Other Clinician: Referring Provider: Treating Provider/Extender:Amritpal Shropshire, Velva Harman, Essie Christine in Treatment: 340 Subjective History of Present Illness (HPI) the remaining wound is over the left medial ankle. Similar wound over the right medial ankle healed largely with use of Apligraf. Most recently we have been using Hydrofera Blue over this wound with considerable improvement. The patient has been extensively worked up in the past for her venous insufficiency and she is not a candidate for antireflux surgery although I have none of the details available currently. 08/24/14; considerable improvement today. About 50% of this wound areas now epithelialized. The base of the wound appears to be healthier granulation.as opposed to last week when she had deteriorated a  considerable improvement 08/17/14; unfortunately the wound has regressed somewhat. The areas of epithelialization from the superior aspect are not nearly as healthy as they were last week. The patient thinks her Hydrofera Blue slipped. 09/07/14; unfortunately the area has markedly regressed in the 2 weeks since I've seen this. There is an odor surrounding erythema. The healthy granulation tissue that we had at the base of the wound now is a dusky color. The nurse reports green drainage 09/14/14; the area looks somewhat better than last week. There is less erythema and less drainage. The culture I did did not show any growth. Nevertheless I think it is better to continue the Cipro and doxycycline for a further week. The remaining wound area was debridement. 09/21/14. Wound did not require debridement last week. Still less erythema and less drainage. She can complete her antibiotics. The areas of epithelialization in the superior aspect of the wound do not look as healthy as they did some weeks ago 10/05/14 continued improvement in the condition of this wound. There is advancing epithelialization. Less aggressive debridement required 10/19/14 continued improvement in the condition and volume of this  wound. Less aggressive debridement to the inferior part of this to remove surface slough and fibrinous eschar 11/02/14 no debridement is required. The surface granulation appears healthy although some of her islands of epithelialization seem to have regressed. No evidence of infection 11/16/14; lites surface debridement done of surface eschar. The wound does not look to be unhealthy. No evidence of infection. Unfortunately the patient has had podiatry issues in the right foot and for some reason has redeveloped small surface ulcerations in the medial right ankle. Her original presentation involved wounds in this area 11/23/14 no debridement. The area on the right ankle has enlarged. The left ankle wound appears  stable in terms of the surface although there is periwound inflammation. There has been regression in the amount of new skin 11/30/14 no debridement. Both wound areas appear healthy. There was no evidence of infection. The the new area on the right medial ankle has enlarged although that both the surfaces appear to be stable. 12/07/14; Debridement of the right medial ankle wound. No no debridement was done on the left. 12/14/14 no major change in and now bilateral medial ankle wounds. Both of these are very painful but the no overt evidence of infection. She has had previous venous ablation 12/21/14; patient states that her right medial ankle wound is considerably more painful last week than usual. Her left is also somewhat painful. She could not tolerate debridement. The right medial ankle wound has fibrinous surface eschar 12/28/14 this is a patient with severe bilateral venous insufficiency ulcers. For a considerable period of time we actually had the one on the right medial ankle healed however this recently opened up again in June. The left medial ankle wound has been a refractory area with some absent flows. We had some success with Hydrofera Blue on this area and it literally closed by 50% however it is recently opened up Foley. Both of these were debridement today of surface eschar. She tolerates this poorly 01/25/15: No change in the status of this. Thick adherent escar. Very poor tolerance of any attempt at debridement. I had healed the right medial malleolus wound for a considerable amount of time and had the left one down to about 50% of the volume although this is totally regressed over the last 48 weeks. Further the right leg has reopened. she is trying to make a appointment with pain and vascular, previous ablations with Dr. Aleda Grana. I do not believe there is an arterial insufficiency issue here 02/01/15 the status of the adherent eschar bilaterally is actually improved. No debridement  was done. She did not manage to get vascular studies done 02/08/15 continued debridement of the area was done today. The slough is less adherent and comes off with less pressure. There is no surrounding infection peripheral pulses are intact 02/15/15 selective debridement with a disposable curette. Again the slough is less adherent and comes off with less difficulty. No surrounding infection peripheral pulses are intact. 02/22/15 selective debridement of the right medial ankle wound. Slough comes off with less difficulty. No obvious surrounding infection peripheral pulses are intact I did not debridement the one on the left. Both of these are stable to improved 03/01/15 selective debridement of both wound areas using a curette to. Adherent slough cup soft with less difficulty. No obvious surrounding infection. The patient tells me that 2 days ago she noted a rash above the right leg wrap. She did not have this on her lower legs when she change this over she arrives with widespread  left greater than right almost folliculitis-looking rash which is extremely pruritic. I don't see anything to culture here. There is no rash on the rest of her body. She feels well systemically. 03/08/15; selective debridement of both wounds using a curette. Base of this does not look unhealthy. She had limegreen drainage coming out of the left leg wound and describes a lot of drainage. The rash on her left leg looks improved to. No cultures were done. 03/22/15; patient was not here last week. Basal wounds does not look healthy and there is no surrounding erythema. No drainage. There is still a rash on the left leg that almost looks vasculitic however it is clearly limited to the top of where the wrap would be. 04/05/15; on the right required a surgical debridement of surface eschar and necrotic subcutaneous tissue. I did not debridement the area on the left. These continue to be large open wounds that are not changing that  much. We were successful at one point in healing the area on the right, and at the same time the area on the left was roughly half the size of current measurements. I think a lot of the deterioration has to do with the prolonged time the patient is on her feet at work 04/19/15 I attempted-like surface debridement bilaterally she does not tolerate this. She tells me that she was in allergic care yesterday with extreme pain over her left lateral malleolus/ankle and was told that she has an "sprain" 05/03/15; large bilateral venous insufficiency wounds over the medial malleolus/medial aspect of her ankles. She complains of copious amounts of drainage and his usual large amounts of pain. There is some increasing erythema around the wound on the right extending into the medial aspect of her foot to. historically she came in with these wounds the right one healed and the left one came down to roughly half its current size however the right one is reopened and the left is expanded. 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 05/10/15 large bilateral venous insufficiency wounds. There is less adherence surface left however the surface culture that I did last week grew pseudomonas therefore bilateral selective debridement score necessary. There is surrounding erythema. The patient describes severe bilateral drainage and a lot of pain in the left ankle. Apparently her podiatrist was were ready to do a cortisone shot 05/17/15; the patient complains of pain and again copious amounts of drainage. 05/24/15; we used Iodo flex last week. Patient notes considerable improvement in wound drainage. Only needed to change this once. 05/31/15; we continued Iodoflex; the base of these large wounds bilaterally is not too bad but there is probably likely a significant bioburden here. I would like to debridement just doesn't tolerate it. 06/06/14 I would like to continue the  Iodoflex although she still hasn't managed to obtain supplies. She has bilateral medial malleoli or large wounds which are mostly superficial. Both of them are covered circumferentially with some nonviable fibrinous slough although she tolerates debridement very poorly. She apparently has an appointment for an ablation on the right leg by interventional radiology. 06/14/15; the patient arrives with the wounds and static condition. We attempted a debridement although she does not do well with this secondary to pain. I 07/05/15; wounds are not much smaller however there appears to be a cleaner granulating base. The left has tight fibrinous slough greater than the right. Debridement is tolerated poorly due to pain.  Iodoflex is done more for these wounds in any of the multitude of different dressings I have tried on the left 1 and then subsequently the right. 07/12/15; no change in the condition of this wound. I am able to do an aggressive debridement on the right but not the left. She simply cannot tolerate it. We have been using Iodoflex which helps somewhat. It is worthwhile remembering that at one point we healed the right medial ankle wound and the left was about 25% of the current circumference. We have suggested returning to vascular surgery for review of possible further ablations for one reason or another she has not been able to do this. 07/26/15 no major change in the condition of either wound on her medial ankle. I did not attempt to debridement of these. She has been aggressively scrubbing these while she is in the shower at home. She has her supply of Iodoflex which seems to have done more for these wounds then anything I have put on recently. 08/09/15 wound area appears larger although not verified by measurements. Using Iodoflex 09/05/2015 -- she was here for avisit today but had significant problems with the wound and I was asked to see her for a physician opinion. I have summarize that this  lady has had surgery on her left lower extremity about 10 years ago where the possible veins stripping was done. She has had an opinion from interventional radiology around November 2016 where no further sclerotherapy was ordered. The patient works 12 hours a day and stands on a concrete floor with work boots and is unable to get the proper compression she requires and cannot elevate her limbs appropriately at any given time. She has recently grown Pseudomonas from her wound culture but has not started her ciprofloxacin which was called in for her. 09/13/15 this continues to be a difficult situation for this patient. At one point I had this wound down to a 1.5 x 1.5" wound on her left leg. This is deteriorated and the right leg has reopened. She now has substantial wounds on her medial calcaneus, malleoli and into her lower leg. One on the left has surface eschar but these are far too painful for me to debridement here. She has a vascular surgery appointment next week to see if anything can be done to help here. I think she has had previous ablations several years ago at [facility]. She has no major edema. She tells me that she did not get product last time [facility]] and went for several days without it. She continues to work in work boots 12 hours a day. She cannot get compression/4-layer under her work boots. 09/20/15 no major change. Periwound edema control was not very good. Her point with pain and vascular is next Wednesday the 25th 09/28/15; the patient is seen vascular surgery and is apparently scheduled for repeat duplex ultrasounds of her bilateral lower legs next week. 10/05/15; the patient was seen by Dr. Doren Custard of vascular surgery. He feels that she should have arterial insufficiency excluded as cause/contributed to her nonhealing stage she is therefore booked for an arteriogram. She has apparently monophasic signals in the dorsalis pedis pulses. She also of course has known severe  chronic venous insufficiency with previous procedures as noted previously. I had another long discussion with the patient today about her continuing to work 12 hour shifts. I've written her out for 2 months area had concerns about this as her work location is currently undergoing significant turmoil and  this may lead to her termination. She is aware of this however I agree with her that she simply cannot continue to stand for 12 hours multiple days a week with the substantial wound areas she has. 10/19/15; the Dr. Doren Custard appointment was largely for an arteriogram which was normal. She does not have an arterial issue. He didn't make a comment about her chronic venous insufficiency for which she has had previous ablations. Presumably it was not felt that anything additional could be done. The patient is now out of work as I prescribed 2 weeks ago. Her wounds look somewhat less aggravated presumably because of this. I felt I would give debridement another try today 10/25/15; no major change in this patient's wounds. We are struggling to get her product that she can afford into her own home through her insurance. 11/01/15; no major change in the patient's wounds. I have been using silver alginate as the most affordable product. I spoke to Dr. Marla Roe last week with her requested take her to the OR for surgical debridement and placement of ACEL. Dr. Marla Roe told me that she would be willing to do this however [facility] will not cover this, fortunately the patient has [place] healthcare of some variant 11/08/15; no major change in the patient's wounds. She has been completely nonviable surface that this but is in too much pain with any attempted debridement are clinic. I have arranged for her to see Dr. Marla Roe ham of plastic surgery and this appointment is on Monday. I am hopeful that they will take her to the OR for debridement, possible ACEL ultimately possible skin graft 11/22/15 no  major change in the patient's wounds over her bilateral medial calcaneus medial malleolus into the lower legs. Surface on these does not look too bad however on the left there is surrounding erythema and tenderness. This may be cellulitis or could him sleepy tinea. 11/29/15; no major changes in the patient's wounds over her bilateral medial malleolus. There is no infection here and I don't think any additional antibiotics are necessary. There is now plan to move forward. She sees Dr. Marla Roe in a week's time for preparation for operative debridement and ACEL placement I believe on 7/12. She then has a follow-up appointment with Dr. Marla Roe on 7/21 12/28/15; the patient returns today having been taken to the [place] by Dr. Marla Roe 12/12/15 she underwent debridement, intraoperative cultures [which were negative]. She had placement of a wound VAC. Parent really ACEL was not available to be placed. The wound VAC foam apparently adhered to the wound since then she's been using silver alginate, Xeroform under Ace wraps. She still says there is a lot of drainage and a lot of pain 01/31/16; this is a patient I see monthly. I had referred her to Dr. Marla Roe him of plastic surgery for large wounds on her bilateral medial ankles. She has been to the OR twice once in early July and once in early August. She tells me over the last 3 weeks she has been using the wound VAC with ACEL underneath it. On the right we've simply been using silver alginate. Under Kerlix Coban wraps. 02/28/16; this is a patient I'm currently seeing monthly. She is gone on to have a skin graft over her large venous insufficiency ulcer on the left medial ankle. This was done by Dr. Marla Roe him. The patient is a bit perturbed about why she didn't have one on her right medial ankle wound. She has been using silver alginate  to this. 03/06/16; I received a phone call from her plastic surgery Dr. Marla Roe. She expressed some concern about  the viability of the skin graft she did on the left medial ankle wound. Asked me to place Endoform on this. She told me she is not planning to do a subsequent skin graft on the right as the left one did not take very well. I had placed Hydrofera Blue on the right 03/13/16; continue to have a reasonably healthy wound on the right medial ankle. Down to 3 mm in terms of size. There is epithelialization here. The area on the left medial ankle is her skin graft site. I suppose the last week this looks somewhat better. She has an open area inferiorly however in the center there appears to be some viable tissue. There is a lot of surface callus and eschar that will eventually need to come off however none of this looked to be infected. Patient states that the is able to keep the dressing on for several days which is an improvement. 03/20/16 no major change in the circumference of either wound however on the left side the patient was at Dr. Eusebio Friendly office and they did a debridement of left wound. 50% of the wound seems to be epithelialized. I been using Endoform on the left Hydrofera Blue in the right 03/27/16; she arrives today with her wound is not looking as healthy as they did last week. The area on the right clearly has an adherent surface to this a very similar surface on the left. Unfortunately for this patient this is all too familiar problem. Clearly the Endoform is not working and will need to change that today that has some potential to help this surface. She does not tolerate debridement in this clinic very well. She is changing the dressing wants 04/03/16; patient arrives with the wounds looking somewhat better especially on the right. Dr. Migdalia Dk change the dressing to silver alginate when she saw her on Monday and also sold her some compression socks. The usefulness of the latter is really not clear and woman with severely draining wounds. 04/10/16; the patient is doing a bit of an  experiment wearing the compression stockings that Dr. Migdalia Dk provided her to her left leg and the out of legs based dressings that we provided to the right. 05/01/16; the patient is continuing to wear compression stockings Dr. Migdalia Dk provided her on the left that are apparently silver impregnated. She has been using Iodoflex to the right leg wound. Still a moderate amount of drainage, when she leaves here the wraps only last for 4 days. She has to change the stocking on the left leg every night 05/15/16; she is now using compression stockings bilaterally provided by Dr. Marla Roe. She is wearing a nonadherent layer over the wounds so really I don't think there is anything specific being done to this now. She has some reduction on the left wound. The right is stable. I think all healing here is being done without a specific dressing 06/09/16; patient arrives here today with not much change in the wound certainly in diameter to large circular wounds over the medial aspect of her ankle bilaterally. Under the light of these services are certainly not viable for healing. There is no evidence of surrounding infection. She is wearing compression stockings with some sort of silver impregnation as prescribed by Dr. Marla Roe. She has a follow-up with her tomorrow. 06/30/16; no major change in the size or condition of her wounds. These are  still probably covered with a nonviable surface. She is using only her purchase stockings. She did see Dr. Marla Roe who seemed to want to apply Dakin's solution to this I'm not extreme short what value this would be. I would suggest Iodoflex which she still has at home. 07/28/16; I follow [name] episodically along with Dr. Marla Roe. 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 She has been applying a topical collagen ointment to both wounds with Adaptic. I don't think Dr. Marla Roe is planning to take  her back to the OR. 08/19/16; I follow [name] on a monthly basis along with Dr. Marla Roe of plastic surgery. 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 I been following her for a number of years. At one point I was able to get the right medial malleolus wound to heal and had the left medial malleolus down to about half its current size however and I had to send her to plastic surgery for an operative debridement. Since then things have been stable to slightly improve the area on the right is slightly better one in the left about the same although there is much less adherent surface than I'm used to with this patient. She is using some form of liquid collagen gel that Dr. Marla Roe provided a Kerlix cover with the patient's own pressure stockings. She tells me that she has extreme pain in both ankles and along the lateral aspect of both feet. She has been unable to work for some period of time. She is telling me she is retiring at the beginning of April. She sees Dr. Doran Durand of orthopedics next week 09/22/16; patient has not seen Dr. Marla Roe since the last time she is here. I'm not really sure what she is using to the wounds other than bits and pieces of think she had left over including most recently Hydrofera Blue. She is using juxtalite stockings. She is having difficulty with her husband's recent illness "stroke". She is having to transport him to various doctors appointments. Dr. Marla Roe left her the option of a repeat debridement with ACEL however she has not been able to get the time to follow-up on this. She continues to have a fair amount of drainage out of these wounds with certainly precludes leaving dressings on all week 10/13/16; patient has not seen Dr. Marla Roe since she was last in our clinic. I'm not really sure what she is doing with the wounds, we did try to get her [facility] and I think she is  actually using this most of the time. Because of drainage she states she has to change this every second day although this is an improvement from what she used to do. She went to see Dr. Doran Durand who did not think she had a muscular issue with regards to her feet, he referred her to a neurologist and I think the appointment is sometime in June. I changed her back to Iodoflex which she has used in the past but not recently. 11/03/16; the patient has been using Iodoflex although she ran out of this. Still claims that there is a lot of drainage although the wound does not look like this. No surrounding erythema. She has not been back to see Dr. Marla Roe 11/24/16; the patient has been using Iodoflex again but she ran out of it 2 or 3 days ago. There is no major change in the condition of  either one of these wounds in fact they are larger and covered in a thick adherent surface slough/nonviable tissue especially on the left. She does not tolerate mechanical debridement in our clinic. Going back to see Dr. Marla Roe of plastic surgery for an operative debridement would seem reasonable. 12/15/16; the patient has not been back to see Dr. Marla Roe. She is been dealing with a series of illnesses and her husband which of monopolized her time. She is been using Sorbact which we largely supplied. She states the drainage is bad enough that it maximum she can go 2-3 days without changing the dressing 01/12/2017 -- the patient has not been back for about 4 weeks and has not seen Dr. Marla Roe not does she have any appointment pending. 01/23/17; patient has not seen Dr. Marla Roe even though I suggested this previously. She is using Santyl that was suggested last week by Dr. Con Memos this Cost her $16 through her insurance which is indeed surprising 02/12/17; continuing Santyl and the patient is changing this daily. A lot of drainage. She has not been back to see plastic surgery she is using an Ace wrap. Our intake  nurse suggested wrap around stockings which would make a good reasonable alternative 02/26/17; patient is been using Santyl and changing this daily due to drainage. She has not been to see plastic surgery she uses in April Ace wrap to control the edema. She did obtain extremitease stockings but stated that the edema in her leg was to big for these 03/20/17; patient is using Santyl and Anasept. Surfaces looked better today the area on the right is actually measuring a little smaller. She has states she has a lot of pain in her feet and ankles and is asking for a consult to pain control which I'll try to help her with through our case manager. 04/10/17; the patient arrives with better-looking wound surfaces and is slightly smaller wound on the left she is using a combination of Santyl and Anasept. She has an appointment or at least as started in the pain control center associated with [facility] 05/14/17; this is a patient who I followed for a prolonged period of time. 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 At one point I had this down to a much smaller wound on the left however these reopened and we've never been able to get these to heal. She has been using Santyl and Anasept gel although 2 weeks ago she ran out of the Anasept gel. She has a stable appearance of the wound. She is going to the wound care clinic at [facility] They wanted do a nerve block/spinal block although she tells me she is reluctant to go forward with that. 05/21/17; this is a patient I have followed for many years. 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 Chronic pain and deformity in her ankles as well. She is been to see plastic surgery as well as orthopedics. Using PolyMem AG most recently/Kerramax/ABDs and 2 layer compression. She has managed to keep this on and she is coming in for a nurse check to change the dressing on Tuesdays, we see her on  Fridays 06/05/17; really quite a good looking surface and the area especially on the right medial has contracted in terms of dimensions. Well granulated healthy-looking tissue on both sides. Even with an open curet there is nothing that even feels abnormal here. This is as good as I've seen this in quite some time. We have been  using PolyMem AG and bringing her in for a nurse check 06/12/17; really quite good surface on both of these wounds. The right medial has contracted a bit left is not. We've been using PolyMem and AG and she is coming in for a nurse visit 06/19/17; we have been using PolyMem AG and bringing her in for a nurse check. Dimensions of her wounds are not better but the surfaces looked better bilaterally. She complained of bleeding last night and the left wound and increasing pain bilaterally. She states her wound pain is more neuropathic than just the wounds. There was some suggestion that this was radicular from her pain management doctor in talking to her it is really difficult to sort this out. 06/26/17; using PolyMem and AG and bringing her in for a nurse check as All of this and reasonably stable condition. Certainly not improved. The dimensions on the lateral part of the right leg look better but not really measuring better. The medial aspect on the left is about the same. 07/03/16; we have been using PolyMen AG and bringing her in for a nurse check to change the dressings as the wounds have drainage which precludes once weekly changing. We are using all secondary absorptive dressings.our intake nurse is brought up the idea of using a wound VAC/snap VAC on the wound to help with the drainage to see if this would result in some contraction. This is not a bad idea. The area on the right medial is actually looking smaller. Both wounds have a reasonable-looking surface. There is no evidence of cellulitis. The edema is well controlled 07/10/17; the patient was denied for a snap VAC by  her insurance. The major issue with these wounds continues to be drainage. We are using wicked PolyMem AG and she is coming in for a nurse visit to change this. The wounds are stable to slightly improved. The surface looks vibrant and the area on the right certainly has shrunk in size but very slowly 07/17/17; the patient still has large wounds on her bilateral medial malleoli. Surface of both of these wounds looks better. The dimensions seem to come and go but no consistent improvement. There is no epithelialization. We do not have options for advanced treatment products due to insurance issues. They did not approve of the wound VAC to help control the drainage. More recently we've been using PolyMem and AG wicked to allow drainage through. We have been bringing her in for a nurse visit to change this. We do not have a lot of options for wound care products and the home again due to insurance issues 07/24/17; the patient's wound actually looks somewhat better today. No drainage measurements are smaller still healthy-looking surface. We used silver collagen under PolyMen started last week. We have been bringing her in for a dressing change 07/31/17; patient's wound surface continued to look better and I think there is visible change in the dimensions of the wound on the right. Rims of epithelialization. We have been using silver collagen under PolyMen and bringing her in for a dressing change. There appears to be less drainage although she is still in need of the dressing change 08/07/17. Patient's wound surface continues to look better on both sides and the area on the right is definitely smaller. We have been using silver collagen and PolyMen. She feels that the drainage has been it has been better. I asked her about her vascular status. She went to see Dr. Aleda Grana at [facility] and had  some form of ablation. I don't have much detail on this. I haven't my notes from 2016 that she was not a  candidate for any further ablation but I don't have any more information on this. We had referred her to vein and vascular I don't think she ever went. He does not have a history of PAD although I don't have any information on this either. We don't even have ABIs in our record 08/14/17; we've been using silver collagen and PolyMen cover. And putting the patient and compression. She we are bringing her in as a nurse visit to change this because ofarge amount of drainage. We didn't the ABIs in clinic today since they had been done in many moons 1.2 bilaterally. She has been to see vein and vascular however this was at [facility] and she had ablation although I really don't have any information on this all seemed biking get a report. She is also been operatively debrided by plastic surgery and had a cell placed probably 8-12 months ago. This didn't have a major effect. We've been making some gains with current dressings 08/19/17-She is here in follow-up evaluation for bilateral medial malleoli ulcers. She continues to tolerate debridement very poorly. We will continue with recently changed topical treatment; if no significant improvement may consider switching to Iodosorb/Iodoflex. 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 She has been using silver collagen and PolyMem. I believe she has been used and tried on Iodoflex before. 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 These have been very refractory over time. We have been using silver collagen and PolyMen. She is coming in once a week for a doctor's and once a week for nurse visits. We are actually making some progress 09/18/17; the patient's wounds are smaller especially on the right medial. She arrives  today to upset to consider even washing these off with Anasept which I think is been part of the reason this is been closing. We've been using collagen covered in PolyMen otherwise. It is noted that she has a small area of folliculitis on the right medial calf that. As we are wrapping her legs I'll give her a short course of doxycycline to make sure this doesn't amount to anything. She is a long list of complaints today including imbalance, shortness of breath on exertion, inversion of her left ankle. With regards to the latter complaints she is been to see orthopedics and they offered her a tendon release surgery I believe but wanted her wounds to be closed first. I have recommended she go see her primary physician with regards to everything else. 09/25/17; patient's wounds are about the same size. We have made some progress bilaterally although not in recent weeks. She will not allow me T wash these wounds with Anasept even if she is doing her cell. Wheeze we've been using collagen covered in PolyMen. Last week she had a small area of folliculitis this is now opened into a small wound. She completed 5 days of trimethoprim sulfamethoxazole 10/02/17; unfortunately the area on her left medial ankle is worse with a larger wound area towards the Achilles. The patient complains of a lot of pain. She will not allow debridement although visually I don't think there is anything to debridement in any case. We have  been using silver collagen and PolyMen for several months now. Initially we are making some progress although I'm not really seeing that today. We will move back to [facility]. His admittedly this is a bit of a repeat however I'm hoping that his situation is different now. The patient tells me she had her leg on the left give out on her yesterday this is process some pain. 10/09/17; the patient is seen twice a week largely because of drainage issues coming out of the chronic medial bimalleolar  wounds that are chronic. Last week the dimensions of the one on the left looks a little larger I changed her to [facility]. She comes in today with a history of terrible pain in the bilateral wound areas. She will not allow debridement. She will not even allow a tissue culture. There is no surrounding erythema no no evidence of cellulitis. We have been putting her Kerlix Coban man. She will not allow more aggressive compression as there was a suggestion to put her in 3 layer wraps. 10/16/17; large wounds on her bilateral medial malleoli. These are chronic. Not much change from last week. The surface looks have healthy but absolutely no epithelialization. A lot of pain little less so of drainage. She will not allow debridement or even washing these off in the vigorous fashion with Anasept. 10/23/17; large wounds on her bilateral malleoli which are chronic. Some improvement in terms of size perhaps on the right since last time I saw these. She states that after we increased the 3 layer compression there was some bleeding, when she came in for a nurse visit she did not want 3 layer compression put back on about our nurse managed to convince her. She has known chronic venous visit issues and I'm hoping to get her to tolerate the 3 layer compression. using Hydrofera Blue 10/30/17; absolutely no change in the condition of either wound although we've had some improvement in dimensions on the right.. Attempted to put her in 3 layer compression she didn't tolerated she is back in 2 layer compression. We've been using Hydrofera Blue We looked over her past records. She had venous reflux studies in November 2016. There was no evidence of deep venous reflux on the right. Superficial vein did not show the greater saphenous vein at think this is been previously ablated the small saphenous vein was within normal limits. The left deep venous system showed no DVT the vessels were positive for deep venous reflux  in the posterior tibial veins at the ankle. The greater saphenous vein was surgically absent small saphenous vein was within normal limits. She went to vein and vascular at [facility]. I believe she had an ablation on the left greater saphenous vein. I'll update her reflux studies perhaps ever reviewed by vein and vascular. We've made absolutely no progress in these wounds. Will also try to read and TheraSkins through her insurance 11/06/17; W the patient apparently has a 2 week follow-up with vein and vascular I like him to review the whole issue with regards to her previous vascular workup by Dr. Aleda Grana. We've really made no progress on these wounds in many months. She arrives today with less viable looking surface on the left medial ankle wound. This was apparently looking about the same on Tuesday when she was here for nurse visit. 11/13/17; deep tissue culture I did last time of the left lower leg showed multiple organisms without any predominating. In particular no Staphylococcus or group A strep were  isolated. We sent her for venous reflux studies. She's had a previous left greater saphenous vein stripping and I think sclerotherapy of the right greater saphenous vein. She didn't really look at the lesser saphenous vein this both wounds are on the medial aspect. She has reflux in the common femoral vein and popliteal vein and an accessory vein on the right and the common femoral vein and popliteal vein on the left. I'm going to have her go to see vein and vascular just the look over things and see if anything else beside aggressive compression is indicated here. We have not been able to make any progress on these wounds in spite of the fact that the surface of the wounds is never look too bad. 11/20/17; no major change in the condition of the wounds. Patient reports a large amount of drainage. She has a lot of complaints of pain although enlisting her today I wonder if some of this at  least his neuropathic rather than secondary to her wounds. She has an appointment with vein and vascular on 12/30/17. The refractory nature of these wounds in my mind at least need vein and vascular to look over the wounds the recent reflux studies we did and her history to see if anything further can be done here. I also note her gait is deteriorated quite a bit. 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 She has a bilateral Trendelenburg gait. I wonder if this is neuropathic or perhaps multilevel radicular. 11/27/17; her wounds actually looks slightly better. Healthy-looking granulation tissue a scant amount of epithelialization. [place] [facility] will not pay for [employer]. They will play for tri layer Oasis and Dermagraft. This is not a diabetic ulcer. We'll try for the tri layer Oasis. She still complains of some drainage. She has a vein and vascular appointment on 12/30/17 12/04/17; the wounds visually look quite good. Healthy-looking granulation with some degree of epithelialization. We are still waiting for response to our request for trial to try layer Oasis. Her appointment with vascular to review venous and arterial issues isn't sold the end of July 7/31. Not allow debridement or even vigorous cleansing of the wound surface. 12/18/17; slightly smaller especially on the right. 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 We've been using Hydrofera Blue. We still are looking into triple layer Oasis through her insurance 01/08/18 on evaluation today patient's wound actually appears to be showing signs of good improvement at this point in time. She has been tolerating the dressing changes without complication. Fortunately there does not appear to be any evidence of infection at this point in time. We have been utilizing silver nitrate which does seem to be of benefit for her which is also good news. Overall I'm very happy with how things seem to be both regards  appearance as well as measurement. Patient did see Dr. Bridgett Larsson for evaluation on 12/30/17. In his assessment he felt that stripping would not likely add much more than chronic compression to the patient's healing process. His recommendation was to follow-up in three months with Dr. Doren Custard if she hasn't healed in order to consider referral back to you and see vascular where she previously was in a trial and was able to get her wound to heal. I'll be see what she feels she when you staying compression and he reiterated this as well. 01/13/18 on evaluation today patient appears to actually be doing very well in regard to her bilateral medial malleolus ulcers. She seems  to have tolerated the chemical cauterization with silver nitrate last week she did have some pain through that evening but fortunately states that I'll be see since it seems to be doing better she is overall pleased with the progress. 01/21/18; really quite a remarkable improvement since I've last seen these wounds. We started using silver nitrate specially on the islands of hyper granulation which for some reason her around the wound circumference. This is really done quite nicely. Primary dressing Hydrofera Blue under 4 layer compression. She seems to be able to hold out without a nurse rewrap. Follow-up in 1 week 01/28/18; we've continued the hydrofera blue but continued with chemical cauterization to the wound area that we started about a month ago for irregular hyper granulation. She is made almost stunning improvement in the overall wound dimensions. I was not really expecting this degree of improvement in these chronic wounds 02/05/18; we continue with Hydrofera Bluebut of also continued the aggressive chemical cauterization with silver nitrate. We made nice progress with the right greater than left wound. 02/12/18. We continued with Hydrofera Blue after aggressive chemical cauterization with silver nitrate. We appear to be making nice  progress with both wound areas 02/19/2018; we continue with [facility] after washing the wounds vigorously with Anasept spray and chemical cauterization with silver nitrate. We are making excellent progress. The area on the right's just about closed 02/26/2018. The area on the left medial ankle had too much necrotic debris today. I used a #5 curette we are able to get most of the soft. I continued with the silver nitrate to the much smaller wound on the right medial ankle she had a new area on her right lower pretibial area which she says was due to a role in her compression 03/05/2018; both wound areas look healthy. Not much change in dimensions from last week. I continue to use silver nitrate and Hydrofera Blue. The patient saw Dr. Doren Custard of vein and vascular. He felt she had venous stasis ulcers. He felt based on her previous arteriogram she should have adequate circulation for healing. Also she has deep venous reflux but really no significant correctable superficial venous reflux at this time. He felt we should continue with conservative management including leg elevation and compression 04/02/2018; since we last saw this woman about a month ago she had a fall apparently suffered a pelvic fracture. I did not look up the x-ray. Nevertheless because of pain she literally was bedbound for 2 weeks and had home health coming out to change the dressing. Somewhat predictably this is resulted in considerable improvement in both wound areas. The right is just about closed on the medial malleolus and the left is about half the size. 04/16/2018; both her wounds continue to go down in size. Using Hydrofera Blue. 05/07/18; both her wounds appeared to be improving especially on the right where it is almost closed. We are using Hydrofera Blue 05/14/2018; slightly worse this week with larger wounds. Surface on the left medial not quite as good. We have been using Hydrofera Blue 05/21/18; again the wounds are  slightly larger. Left medial malleolus slightly larger with eschar around the circumference. We have been using Hydrofera Blue undergoing a wraps for a prolonged period of time. This got a lot better when she was more recumbent due to a fall and a back injury. I change the primary dressing the silver alginate today. She did not tolerate a 4 layer compression previously although I may need to bring this up  with her next time 05/28/2018; area on the left medial malleolus again is slightly larger with more drainage. Area on the right is roughly unchanged. She has a small area of folliculitis on the right medial just on the lower calf. This does not look ominous. 06/03/2018 left medial malleolus slightly smaller in a better looking surface. We used silver nitrate on this last time with silver alginate. The area on the right appears slightly smaller 1/10; left medial malleolus slightly smaller. Small open area on the right. We used silver nitrate and silver alginate as of 2 weeks ago. We continue with the wound and compression. These got a lot better when she was off her feet 1/17; right medial malleolus wound is smaller. The left may be slightly smaller. Both surfaces look somewhat better. 1/24; both wounds are slightly smaller. Using silver alginate under Unna boots 1/31; both wounds appear smaller in fact the area on the right medial is just about closed. Surface eschar. We have been using silver alginate under Unna boots. The patient is less active now spends let much less time on her feet and I think this is contributed to the general improvement in the wound condition 2/7; both wounds appear smaller. I was hopeful the right medial would be closed however there there is still the same small open area. Slight amount of surface eschar on the left the dimensions are smaller there is eschar but the wound edges appear to be free. We have been using silver alginate under Unna boot's 2/14; both wounds  once again measure smaller. Circumferential eschar on the left medial. We have been using silver alginate under Unna boots with gradual improvement 2/21; the area on the right medial malleolus has healed. The area on the left is smaller. We have been using silver alginate and Unna boots. We can discharge wrapping the right leg she has 20/30 stockings at home she will need to protect the scar tissue in this area 2/28; the area on the right medial malleolus remains closed the patient has a compression stocking. The area on the left is smaller. We have been using silver alginate and Unna boots. 3/6 the area on the right medial ankle remains closed. Good edema control noted she is using her own compression stocking. The area on the left medial ankle is smaller. We have been managing this with silver alginate and Unna boots which we will continue today. 3/13; the area on the right medial ankle remains closed and I'm declaring it healed today. When necessary the left is about the same still a healthy-looking surface but no major change and wound area. No evidence of infection and using silver alginate under unna and generally making considerable improvement 3/27 the area on the right medial ankle remains closed the area on the left is about the same as last week. Certainly not any worse we have been using silver alginate under an Unna boot 4/3; the area on the right medial ankle remains closed per the patient. We did not look at this wound. The wound on the left medial ankle is about the same surface looks healthy we have been using silver alginate under an Unna boot 4/10; area on the right medial ankle remains closed per the patient. We did not look at this wound. The wound on the left medial ankle is slightly larger. The patient complains that the [facility] caused burning pain all week. She also told us that she was a lot more active this week. Changed her  back to silver alginate 4/17; right  medial ankle still closed per the patient. Left medial ankle is slightly larger. Using silver alginate. She did not tolerate Hydrofera Blue on this area 4/24; right medial ankle remains closed we have not look at this. The left medial ankle continues to get larger today by about a centimeter. We have been using silver alginate under Unna boots. She complains about 4 layer compression as an alternative. She has been up on her feet working on her garden 5/8; right medial ankle remains closed we did not look at this. The left medial ankle has increased in size about 100%. We have been using silver alginate under Unna boots. She noted increased pain this week and was not surprised that the wound is deteriorated 5/15; no major change in SA however much less erythema ( one week of doxy ocellulitis). 5/22-64 year old female returns at 1 week to the clinic for left medial ankle wound for which we have been using silver alginate under 3 layer compression She was placed on DOXY at last visit - the wound is wider at this visit. She is in 3 layer compression 5/29; change to [facility] last week. I had given her empiric doxycycline 2 weeks ago for a week. She is in 3 layer compression. She complains of a lot of pain and drainage on presentation today. 6/5; using Hydrofera Blue. I gave her doxycycline recently empirically for erythema and pain around the wound. Believe her cultures showed enterococcus which not would not have been well covered by doxycycline nevertheless the wound looks better and I don't feel specifically that the enterococcus needs to be covered. She has a new what looks like a wrap injury on her lateral left ankle. 6/12; she is using Hydrofera Blue. She has a new area on the left anterior lower tibial area. This was a wrap injury last week. 6/19; the patient is using Hydrofera Blue. She arrived with marked inflammation and erythema around the wound and tenderness. 12/01/18 on evaluation  today patient appears to be doing a little bit better based on what I'm hearing from the standpoint of lassos evaluation to this as far as the overall appearance of the wound is concerned. Then sometime substandard she typically sees Dr. Dellia Nims. Nonetheless overall very pleased with the progress that she's made up to this point. No fevers, chills, nausea, or vomiting noted at this time. 7/10; some improvement in the surface area. Aggressively debrided last week apparently. I went ahead with the debridement today although the patient does not tolerate this very well. We have been using Iodoflex. Still a fair amount of drainage 7/17; slightly smaller. Using Iodoflex. 7/24; no change from last week in terms of surface area. We have been using Iodoflex. Surface looks and continues to look somewhat better 7/31; surface area slightly smaller better looking surface. We have been using Iodoflex. This is under Unna boot compression 8/7-Patient presents at 1 week with Unna boot and Iodoflex, wound appears better 8/14-Patient presents at 1 week with Iodoflex, we use the Unna boot, wound appears to be stable better.Patient is getting Botox treatment for the inversion of the foot for tendon release, Next week 8/21; we are using Iodoflex. Unna boot. The wound is stable in terms of surface area. Under illumination there is some areas of the wound that appear to be either epithelialized or perhaps this is adherent slough at this point I was not really clear. It did not wipe off and I was reluctant to debride  this today. 8/28; we are using Iodoflex in an Unna boot. Seems to be making good improvement. 9/4; using Iodoflex and wound is slightly smaller. 9/18; we are using Iodoflex with topical silver nitrate when she is here. The wound continues to be smaller 10/2; patient missed her appointment last week due to GI issues. She left and Iodoflex based dressing on for 2 weeks. Wound is about the same size about the  size of a dime on the left medial lower 10/9 we have been using Iodoflex on the medial left ankle wound. She has a new superficial probable wrap injury on the dorsal left ankle 10/16; we have been using Hydrofera Blue since last week. This is on the left medial ankle 10/23; we have been using Hydrofera Blue since 2 weeks ago. This is on the left medial ankle. Dimensions are better 11/6; using Hydrofera Blue. I think the wound is smaller but still not closed. Left medial ankle 11/13; we have been using Hydrofera Blue. Wound is certainly no smaller this week. Also the surface not as good. This is the remanent of a very large area on her left medial ankle. 11/20; using Sorbact since last week. Wound was about the same in terms of size although I was disappointed about the surface debris 12/11; 3-week follow-up. Patient was on vacation. Wound is measuring slightly larger we have been using Sorbact. 12/18; wound is about the same size however surface looks better last week after debridement. We have been using Sorbact under compression 1/15 wound is probably twice the size of last time increased in length nonviable surface. We have been using Sorbact. She was running a mild fever and missed her appointment last week Objective Constitutional Wide pulse pressure but looks well. Pulse regular and within target range for patient.Marland Kitchen Respirations regular, non- labored and within target range.. Temperature is normal and within the target range for the patient.Marland Kitchen Appears in no distress. Vitals Time Taken: 10:43 AM, Height: 68 in, Weight: 127 lbs, BMI: 19.3, Temperature: 98.4 F, Pulse: 63 bpm, Respiratory Rate: 18 breaths/min, Blood Pressure: 117/38 mmHg. General Notes: Wound exam; left medial malleolus. Nonviable surface debrided with a #5 curette. Hemostasis with direct pressure Integumentary (Hair, Skin) Wound #3 status is Open. Original cause of wound was Gradually Appeared. The wound is located on  the Left,Medial Malleolus. The wound measures 2.7cm length x 1.6cm width x 0.1cm depth; 3.393cm^2 area and 0.339cm^3 volume. There is Fat Layer (Subcutaneous Tissue) Exposed exposed. There is no tunneling or undermining noted. There is a medium amount of serosanguineous drainage noted. The wound margin is distinct with the outline attached to the wound base. There is medium (34-66%) pink, pale granulation within the wound bed. There is a medium (34-66%) amount of necrotic tissue within the wound bed including Adherent Slough. 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 There was a Excisional Skin/Subcutaneous Tissue Debridement with a total area of 4.32 sq cm performed by [name]., MD. With the following instrument(s): Curette to remove Viable and Non-Viable tissue/material. Material removed includes Subcutaneous Tissue and Slough and after achieving pain control using Other (benzocaine 20% spray). No specimens were taken. A time out was conducted at 11:25, prior to the start of the procedure.  A Minimum amount of bleeding was controlled with Pressure. The procedure was tolerated well with a pain level of 7 throughout and a pain level of 4 following the procedure. Post Debridement Measurements: 2.7cm length x 1.6cm width x 0.1cm depth; 0.339cm^3 volume. Character of Wound/Ulcer Post Debridement is improved. Severity of Tissue Post Debridement is: Fat layer exposed. Post procedure Diagnosis Wound #3: Same as Pre-Procedure Pre-procedure diagnosis of Wound #3 is a Venous Leg Ulcer located on the Left,Medial Malleolus . There was a Haematologist Compression Therapy Procedure by Levan Hurst, RN. Post procedure Diagnosis Wound #3: Same as Pre-Procedure Plan Follow-up Appointments: Return Appointment in 1 week. - Friday Dressing Change Frequency: Wound #3 Left,Medial Malleolus: Do not change entire dressing for one week. - change dressing in one week Skin Barriers/Peri-Wound Care: Moisturizing lotion TCA Cream or Ointment - mixed with lotion Wound Cleansing: Clean wound with Wound Cleanser - clean with anasept with dressing changes May shower with protection. Primary Wound Dressing: Wound #3 Left,Medial Malleolus: Hydrofera Blue - classic Secondary Dressing: Wound #3 Left,Medial Malleolus: Dry Gauze Kerramax Edema Control: Unna Boot to Left Lower Extremity - no kerlix layer Avoid standing for long periods of time Elevate legs to the level of the heart or above for 30 minutes daily and/or when sitting, a frequency of: - throughout the day Support Garment 20-30 mm/Hg pressure to: - compression stocking right leg daily The following medication(s) was prescribed: benzocaine topical 20 % aerosol aerosol topical for prior to debridement was prescribed at facility 1. Change the primary dressing to [facility] Blue classic. Electronic Signature(s) Signed: 06/17/2019 6:20:19 PM By: Linton Ham MD Entered By: Linton Ham on 06/17/2019 12:26:57 -------------------------------------------------------------------------------- [place] Details Patient Name: Date of Service: [name] 06/17/2019 Medical Record [id number] Patient Account Number: [id number] Date of Birth/Sex: Treating RN: 04/30/1956 (64 y.o. [name] Provider: [name] Other Clinician: Referring Provider: Treating Provider/Extender:Valentin Benney, Tawny Asal in Treatment: 340 Diagnosis Coding ICD-10 Codes Code Description [phone] Non-pressure chronic ulcer of left ankle with fat layer exposed I83.212 Varicose veins of right lower extremity  with both ulcer of calf and inflammation Facility Procedures CPT4 Code Description: [id number] - DEB SUBQ TISSUE 20 SQ CM/< ICD-10 Diagnosis Description L97.322 Non-pressure chronic ulcer of left ankle with fat lay Modifier: er exposed Quantity: 1 Physician Procedures CPT4 Code Description: [id number] - WC PHYS SUBQ TISS 20 SQ CM ICD-10 Diagnosis Description [id number] Non-pressure chronic ulcer of left ankle with fat lay Modifier: er exposed Quantity: 1 Electronic Signature(s) Signed: 06/17/2019 6:20:19 PM By: Linton Ham MD Entered By: Linton Ham on 06/17/2019 12:27:06

## 2019-06-24 ENCOUNTER — Encounter (HOSPITAL_BASED_OUTPATIENT_CLINIC_OR_DEPARTMENT_OTHER): Payer: Medicare Other | Admitting: Internal Medicine

## 2019-06-24 ENCOUNTER — Other Ambulatory Visit: Payer: Self-pay

## 2019-06-24 DIAGNOSIS — L97322 Non-pressure chronic ulcer of left ankle with fat layer exposed: Secondary | ICD-10-CM | POA: Diagnosis not present

## 2019-06-24 NOTE — Progress Notes (Signed)
[name] [name] [name] ([id number]) Visit Report for 06/24/2019 Arrival Information Details Patient Name: Date of Service: [name], [name] [name] 06/24/2019 10:00 AM Medical Record [id number] Patient Account Number: [id number] Date of Birth/Sex: Treating RN: 12/18/1955 (64 y.o. [name] [name] [name] Primary Care Ardra Kuznicki: [name] Other Clinician: Referring Benigno Check: Treating Ariq Khamis/Extender:Robson, Tawny Asal in Treatment: 341 Visit Information History Since Last Visit Cane Added or deleted any medications: No Patient Arrived: 10:17 Any new allergies or adverse reactions: No Arrival Time: Had a fall or experienced change in No Accompanied By: self None activities of daily living that may affect Transfer Assistance: risk of falls: Patient Identification Verified: Yes Signs or symptoms of abuse/neglect since last No Secondary Verification Process Completed: Yes visito Patient Requires Transmission-Based No Hospitalized since last visit: No Precautions: Implantable device outside of the clinic excluding No Patient Has Alerts: No cellular tissue based products placed in the center since last visit: Has Dressing in Place as Prescribed: Yes Has Compression in Place as Prescribed: Yes Pain Present Now: Yes Electronic Signature(s) Signed: 06/24/2019 4:58:06 PM By: Baruch Gouty RN, BSN Entered By: Baruch Gouty on 06/24/2019 10:19:13 -------------------------------------------------------------------------------- Compression Therapy Details Patient Name: Date of Service: [name] [name] [name] 06/24/2019 10:00 AM Medical Record [id number] Patient Account Number: [id number] Date of Birth/Sex: Treating RN: 1955-07-18 (64 y.o. [name] [name] [name] Primary Care Dwain Huhn: [name] Other Clinician: Referring Cornelio Parkerson: Treating Hulda Reddix/Extender:Robson, Tawny Asal in Treatment: 341 Compression Therapy Performed for Wound Wound #3  Left,Medial Malleolus Assessment: Performed By: Clinician Kela Millin, RN Compression Type: Rolena Infante Post Procedure Diagnosis Same as Pre-procedure Electronic Signature(s) Signed: 06/24/2019 5:39:40 PM By: Kela Millin Entered By: Kela Millin on 06/24/2019 10:53:57 -------------------------------------------------------------------------------- Encounter Discharge Information Details Patient Name: Date of Service: [name] [name] [name] 06/24/2019 10:00 AM Medical Record [id number] Patient Account Number: [id number] Date of Birth/Sex: Treating RN: 1956-03-05 (64 y.o. [name] [name] [name] Primary Care Ardie Dragoo: [name] Other Clinician: Referring Averie Hornbaker: Treating Gayanne Prescott/Extender:Robson, Tawny Asal in Treatment: [phone] Encounter Discharge Information Items Post Procedure Vitals Discharge Condition: Stable Temperature (F): 97.7 Ambulatory Status: Cane Pulse (bpm): 57 Discharge Destination: Home Respiratory Rate (breaths/min): 18 Transportation: Private Auto Blood Pressure (mmHg): 110/49 Accompanied By: self Schedule Follow-up Appointment: Yes Clinical Summary of Care: Electronic Signature(s) Signed: 06/24/2019 5:44:09 PM By: Deon Pilling Entered By: Deon Pilling on 06/24/2019 11:08:15 -------------------------------------------------------------------------------- Lower Extremity Assessment Details Patient Name: Date of Service: [name], [name] [name] 06/24/2019 10:00 AM Medical Record [id number] Patient Account Number: [id number] Date of Birth/Sex: Treating RN: Oct 18, 1955 (64 y.o. [name] [name] [name] Primary Care Roselina Burgueno: [name] Other Clinician: Referring Candance Bohlman: Treating Chanson Teems/Extender:Robson, Velva Harman, Essie Christine in Treatment: 341 Edema Assessment Assessed: [Left: No] [Right: No] Edema: [Left: N] [Right: o] Calf Left: Right: Point of Measurement: 33 cm From Medial Instep 30.5 cm cm Ankle Left: Right: Point  of Measurement: 12 cm From Medial Instep 21 cm cm Vascular Assessment Pulses: Dorsalis Pedis Palpable: [Left:Yes] Electronic Signature(s) Signed: 06/24/2019 4:58:06 PM By: Baruch Gouty RN, BSN Entered By: Baruch Gouty on 06/24/2019 10:28:10 -------------------------------------------------------------------------------- Multi Wound Chart Details Patient Name: Date of Service: [name] [name] [name] 06/24/2019 10:00 AM Medical Record [id number] Patient Account Number: [id number] Date of Birth/Sex: Treating RN: 1956/04/17 (64 y.o. F) Primary Care Tasman Zapata: [name] Other Clinician: Referring Taiz Bickle: Treating Violeta Lecount/Extender:Robson, Velva Harman, Essie Christine in Treatment: 341 Vital Signs Height(in): 68 Pulse(bpm): 57 Weight(lbs): 127 Blood Pressure(mmHg): 110/49 Body Mass Index(BMI): 19 Temperature(F): 97.7 Respiratory 18 Rate(breaths/min): Photos: [3:No Photos] [N/A:N/A] Wound Location: [3:Left Malleolus - Medial] [N/A:N/A] Wounding  Event: [3:Gradually Appeared] [N/A:N/A] Primary Etiology: [3:Venous Leg Ulcer] [N/A:N/A] Comorbid History: [3:Congestive Heart Failure, Peripheral Vascular Disease, Congestive Heart Failure, End Stage Renal Disease, Tobacco Use, Chronic Obstructive Pulmonary Disease (COPD), Type 1 Diabetes] [N/A:N/A] Date Acquired: [3:11/15/2012] [N/A:N/A] Weeks of Treatment: [3:341] [N/A:N/A] Wound Status: [3:Open] [N/A:N/A] Measurements L x W x D 2.4x1.3x0.1 [N/A:N/A] (cm) Area (cm) : [3:2.45] [N/A:N/A N/A] Volume (cm) : [3:0.245] [N/A:N/A N/A] % Reduction in Area: [3:22.00%] [N/A:N/A N/A] % Reduction in Volume: [3:61.00%] [N/A:N/A N/A] Classification: [3:Full Thickness Without Exposed Support Structures] [N/A:N/A N/A] Exudate Amount: [3:Medium] [N/A:N/A N/A] Exudate Type: [3:Serosanguineous] [N/A:N/A N/A] Exudate Color: [3:red, brown] [N/A:N/A N/A] Wound Margin: [3:Distinct, outline attached N/A] [N/A:N/A] Granulation Amount: [3:Medium  (34-66%)] [N/A:N/A N/A] Granulation Quality: [3:Red, Pink] [N/A:N/A N/A] Necrotic Amount: [3:Medium (34-66%)] [N/A:N/A N/A] Exposed Structures: [3:Fat Layer (Subcutaneous N/A Tissue) Exposed: Yes Fascia: No Tendon: No Muscle: No Joint: No Bone: No] [N/A:N/A] Epithelialization: [3:Small (1-33%)] [N/A:N/A N/A] Debridement: [3:Debridement - Excisional N/A] [N/A:N/A] Pre-procedure [3:10:55] [N/A:N/A N/A] Verification/Time Out Taken: Pain Control: [3:Other] [N/A:N/A N/A] Tissue Debrided: [3:Subcutaneous] [N/A:N/A N/A] Level: [3:Skin/Subcutaneous Tissue] [N/A:N/A N/A] Debridement Area (sq cm):3.12 [N/A:N/A N/A] Instrument: [3:Curette] [N/A:N/A N/A] Bleeding: [3:Minimum] [N/A:N/A N/A] Hemostasis Achieved: [3:Pressure] [N/A:N/A N/A] Procedural Pain: [3:2] [N/A:N/A N/A] Post Procedural Pain: [3:0] [N/A:N/A N/A] Debridement Treatment Procedure was tolerated [N/A:N/A N/A] Response: [3:well] Post Debridement [3:2.4x1.3x0.1] [N/A:N/A N/A] Measurements L x W x D (cm) Post Debridement [3:0.245] [N/A:N/A N/A] Volume: (cm) Procedures Performed: Compression Therapy [3:Debridement] [N/A:N/A N/A] Treatment Notes Wound #3 (Left, Medial Malleolus) 1. Cleanse With Wound Cleanser Soap and water 2. Periwound Care TCA Ointment 3. Primary Dressing Applied Hydrofera Blue 4. Secondary Dressing Dry Gauze Kerramax/Xtrasorb 6. Support Layer Production assistant, radio Notes no kerlix in [employer]. hydrofera classic. foam to lateral side of foot for protection. Electronic Signature(s) Signed: 06/24/2019 5:37:40 PM By: Linton Ham MD Entered By: Linton Ham on 06/24/2019 11:20:04 -------------------------------------------------------------------------------- Multi-Disciplinary Care Plan Details Patient Name: Date of Service: [name] [name] [name] 06/24/2019 10:00 AM Medical Record [id number] Patient Account Number: [id number] Date of Birth/Sex: Treating RN: Mar 26, 1956 (64 y.o. [name] [name] [name] Primary Care Gene Colee: [name] Other Clinician: Referring Jeriel Vivanco: Treating Tniya Bowditch/Extender:Robson, Tawny Asal in Treatment: [phone] Active Inactive Venous Leg Ulcer Nursing Diagnoses: Actual venous Insuffiency (use after diagnosis is confirmed) Goals: Patient will maintain optimal edema control Date Initiated: 09/04/2017 Target Resolution Date: 07/15/2019 Goal Status: Active Patient/caregiver will verbalize understanding of disease process and disease management Date Initiated: 08/03/2014 Date Inactivated: 08/28/2017 Target Resolution Date: 08/29/2017 Goal Status: Met Interventions: Compression as ordered Treatment Activities: Therapeutic compression applied : 09/21/2015 Notes: Wound/Skin Impairment Nursing Diagnoses: Impaired tissue integrity Knowledge deficit related to ulceration/compromised skin integrity Goals: Patient will have a decrease in wound volume by X% from date: (specify in notes) Date Initiated: 04/05/2015 Date Inactivated: 02/12/2017 Target Resolution Date: 09/01/2016 Unmet Reason: chronic, Goal Status: Unmet unable to tolerate stronger compression Patient/caregiver will verbalize understanding of skin care regimen Date Initiated: 04/05/2015 Target Resolution Date: 07/15/2019 Goal Status: Active Interventions: Assess patient/caregiver ability to obtain necessary supplies Assess patient/caregiver ability to perform ulcer/skin care regimen upon admission and as needed Assess ulceration(s) every visit Provide education on ulcer and skin care Notes: Electronic Signature(s) Signed: 06/24/2019 5:39:40 PM By: Kela Millin Entered By: Kela Millin on 06/24/2019 10:08:31 -------------------------------------------------------------------------------- Pain Assessment Details Patient Name: Date of Service: [name], [name] [name] 06/24/2019 10:00 AM Medical Record [id number] Patient Account Number: [id number] Date of  Birth/Sex: Treating RN: [birth year] (64 y.o. [name] [name] [name] Primary Care Raffi Milstein: Barrie Folk,  Noelle Other Clinician: Referring Jimmye Wisnieski: Treating Pietrina Jagodzinski/Extender:Robson, Tawny Asal in Treatment: 341 Active Problems Location of Pain Severity and Description of Pain Patient Has Paino Yes Site Locations Pain Location: Pain in Ulcers With Dressing Change: Yes Duration of the Pain. Constant / Intermittento Constant Rate the pain. Current Pain Level: 5 Worst Pain Level: 8 Least Pain Level: 2 Character of Pain Describe the Pain: Burning Pain Management and Medication Current Pain Management: Medication: Yes Is the Current Pain Management Adequate: Adequate Rest: Yes How does your wound impact your activities of daily livingo Sleep: No Bathing: No Appetite: No Relationship With Others: No Bladder Continence: No Emotions: No Bowel Continence: No Work: No Toileting: No Drive: No Dressing: No Hobbies: No Electronic Signature(s) Signed: 06/24/2019 4:58:06 PM By: Baruch Gouty RN, BSN Entered By: Baruch Gouty on 06/24/2019 10:27:35 -------------------------------------------------------------------------------- Patient/Caregiver Education Details Patient Name: Date of Service: Drzewiecki, Ashely G. 1/22/2021andnbsp10:00 AM Medical Record [id number] Patient Account Number: [id number] Date of Birth/Gender: Treating RN: [birth year] (63 y.o. [name] [name] [name] Primary Care Physician: [name] Other Clinician: Referring Physician: Treating Physician/Extender:Robson, Tawny Asal in Treatment: [phone] Education Assessment Education Provided To: Patient Education Topics Provided Wound/Skin Impairment: Methods: Explain/Verbal Responses: State content correctly Electronic Signature(s) Signed: 06/24/2019 5:39:40 PM By: Kela Millin Entered By: Kela Millin on 06/24/2019  10:09:01 -------------------------------------------------------------------------------- Wound Assessment Details Patient Name: Date of Service: [name], [name] [name] 06/24/2019 10:00 AM Medical Record [id number] Patient Account Number: [id number] Date of Birth/Sex: Treating RN: 16-Sep-1955 (64 y.o. [name] [name] [name] Primary Care Edwen Mclester: [name] Other Clinician: Referring Dasiah Hooley: Treating Jetson Pickrel/Extender:Robson, Velva Harman, Essie Christine in Treatment: 341 Wound Status Wound Number: 3 Primary Venous Leg Ulcer Etiology: Wound Location: Left Malleolus - Medial Wound Open Wounding Event: Gradually Appeared Status: Date Acquired: 11/15/2012 Comorbid Congestive Heart Failure, Peripheral Vascular Weeks Of Treatment: 341 History: Disease, Congestive Heart Failure, End Stage Clustered Wound: No Renal Disease, Tobacco Use, Chronic Obstructive Pulmonary Disease (COPD), Type 1 Diabetes Photos Wound Measurements Length: (cm) 2.4 % Reduct Width: (cm) 1.3 % Reduct Depth: (cm) 0.1 Epitheli Area: (cm) 2.45 Tunneli Volume: (cm) 0.245 Undermi Wound Description Classification: Full Thickness Without Exposed Support Foul Odo Structures Slough/F Wound Distinct, outline attached Margin: Exudate Medium Amount: Exudate Serosanguineous Type: Exudate red, brown Color: Wound Bed Granulation Amount: Medium (34-66%) Granulation Quality: Red, Pink Fascia E Necrotic Amount: Medium (34-66%) Fat Laye Necrotic Quality: Adherent Slough Tendon E Muscle E Joint Ex Bone Exp r After Cleansing: No ibrino Yes Exposed Structure xposed: No r (Subcutaneous Tissue) Exposed: Yes xposed: No xposed: No posed: No osed: No ion in Area: 22% ion in Volume: 61% alization: Small (1-33%) ng: No ning: No Treatment Notes Wound #3 (Left, Medial Malleolus) 1. Cleanse With Wound Cleanser Soap and water 2. Periwound Care TCA Ointment 3. Primary Dressing Applied Hydrofera Blue 4.  Secondary Dressing Dry Gauze Kerramax/Xtrasorb 6. Support Layer Production assistant, radio Notes no kerlix in [employer]. hydrofera classic. foam to lateral side of foot for protection. Electronic Signature(s) Signed: 06/24/2019 4:58:06 PM By: Baruch Gouty RN, BSN Signed: 06/24/2019 5:14:05 PM By: Mikeal Hawthorne EMT/HBOT Entered By: Mikeal Hawthorne on 06/24/2019 15:23:17 -------------------------------------------------------------------------------- Vitals Details Patient Name: Date of Service: [name] [name] [name] 06/24/2019 10:00 AM Medical Record [id number] Patient Account Number: [id number] Date of Birth/Sex: Treating RN: [birth year] (64 y.o. [name] [name] [name] Primary Care Huberta Tompkins: [name] Other Clinician: Referring Stanford Strauch: Treating Bently Morath/Extender:Robson, Tawny Asal in Treatment: 341 Vital Signs Time Taken: 10:19 Temperature (F): 97.7 Height (in): 68 Pulse (bpm):  57 Source: Stated Respiratory Rate (breaths/min): 18 Weight (lbs): 127 Blood Pressure (mmHg): 110/49 Source: Stated Reference Range: 80 - 120 mg / dl Body Mass Index (BMI): 19.3 Electronic Signature(s) Signed: 06/24/2019 4:58:06 PM By: Baruch Gouty RN, BSN Entered By: Baruch Gouty on 06/24/2019 10:20:20

## 2019-06-24 NOTE — Progress Notes (Signed)
[name] ([id number]) Visit Report for 06/24/2019 Debridement Details Patient Name: Date of Service: [name] 06/24/2019 10:00 AM Medical Record [id number] Patient Account Number: [id number] Date of Birth/Sex: Treating RN: Oct 08, 1955 (64 y.o. F) Primary Care Provider: [name] Other Clinician: Referring Provider: Treating Provider/Extender:Laiza Veenstra, Tawny Asal in Treatment: 341 Debridement Performed for Wound #3 Left,Medial Malleolus Assessment: Performed By: Physician [name]., MD Debridement Type: Debridement Severity of Tissue Pre Fat layer exposed Debridement: Level of Consciousness (Pre- Awake and Alert procedure): Pre-procedure Verification/Time Out Taken: Yes - 10:55 Start Time: 10:55 Pain Control: Other : benzocaine, 20% Total Area Debrided (L x W): 2.4 (cm) x 1.3 (cm) = 3.12 (cm) Tissue and other material Viable, Non-Viable, Subcutaneous debrided: Level: Skin/Subcutaneous Tissue Debridement Description: Excisional Instrument: Curette Bleeding: Minimum Hemostasis Achieved: Pressure End Time: 10:56 Procedural Pain: 2 Post Procedural Pain: 0 Response to Treatment: Procedure was tolerated well Level of Consciousness Awake and Alert (Post-procedure): Post Debridement Measurements of Total Wound Length: (cm) 2.4 Width: (cm) 1.3 Depth: (cm) 0.1 Volume: (cm) 0.245 Character of Wound/Ulcer Post Improved Debridement: Severity of Tissue Post Debridement: Fat layer exposed Post Procedure Diagnosis Same as Pre-procedure Electronic Signature(s) Signed: 06/24/2019 5:37:40 PM By: Linton Ham MD Entered By: Linton Ham on 06/24/2019 11:20:53 -------------------------------------------------------------------------------- HPI Details Patient Name: Date of Service: [name] 06/24/2019 10:00 AM Medical Record [id number] Patient Account Number: [id number] Date of Birth/Sex: Treating  RN: 1955-10-02 (64 y.o. F) Primary Care Provider: [name] Other Clinician: Referring Provider: Treating Provider/Extender:Artelia Game, Tawny Asal in Treatment: 341 History of Present Illness HPI Description: the remaining wound is over the left medial ankle. Similar wound over the right medial ankle healed largely with use of Apligraf. Most recently we have been using Hydrofera Blue over this wound with considerable improvement. The patient has been extensively worked up in the past for her venous insufficiency and she is not a candidate for antireflux surgery although I have none of the details available currently. 08/24/14; considerable improvement today. About 50% of this wound areas now epithelialized. The base of the wound appears to be healthier granulation.as opposed to last week when she had deteriorated a considerable improvement 08/17/14; unfortunately the wound has regressed somewhat. The areas of epithelialization from the superior aspect are not nearly as healthy as they were last week. The patient thinks her Hydrofera Blue slipped. 09/07/14; unfortunately the area has markedly regressed in the 2 weeks since I've seen this. There is an odor surrounding erythema. The healthy granulation tissue that we had at the base of the wound now is a dusky color. The nurse reports green drainage 09/14/14; the area looks somewhat better than last week. There is less erythema and less drainage. The culture I did did not show any growth. Nevertheless I think it is better to continue the Cipro and doxycycline for a further week. The remaining wound area was debridement. 09/21/14. Wound did not require debridement last week. Still less erythema and less drainage. She can complete her antibiotics. The areas of epithelialization in the superior aspect of the wound do not look as healthy as they did some weeks ago 10/05/14 continued improvement in the condition of this wound. There is  advancing epithelialization. Less aggressive debridement required 10/19/14 continued improvement in the condition and volume of this wound. Less aggressive debridement to the inferior part of this to remove surface slough and fibrinous eschar 11/02/14 no debridement is required. The surface granulation appears healthy although some of her islands of  epithelialization seem to have regressed. No evidence of infection 11/16/14; lites surface debridement done of surface eschar. The wound does not look to be unhealthy. No evidence of infection. Unfortunately the patient has had podiatry issues in the right foot and for some reason has redeveloped small surface ulcerations in the medial right ankle. Her original presentation involved wounds in this area 11/23/14 no debridement. The area on the right ankle has enlarged. The left ankle wound appears stable in terms of the surface although there is periwound inflammation. There has been regression in the amount of new skin 11/30/14 no debridement. Both wound areas appear healthy. There was no evidence of infection. The the new area on the right medial ankle has enlarged although that both the surfaces appear to be stable. 12/07/14; Debridement of the right medial ankle wound. No no debridement was done on the left. 12/14/14 no major change in and now bilateral medial ankle wounds. Both of these are very painful but the no overt evidence of infection. She has had previous venous ablation 12/21/14; patient states that her right medial ankle wound is considerably more painful last week than usual. Her left is also somewhat painful. She could not tolerate debridement. The right medial ankle wound has fibrinous surface eschar 12/28/14 this is a patient with severe bilateral venous insufficiency ulcers. For a considerable period of time we actually had the one on the right medial ankle healed however this recently opened up again in June. The left medial ankle wound has  been a refractory area with some absent flows. We had some success with Hydrofera Blue on this area and it literally closed by 50% however it is recently opened up Foley. Both of these were debridement today of surface eschar. She tolerates this poorly 01/25/15: No change in the status of this. Thick adherent escar. Very poor tolerance of any attempt at debridement. I had healed the right medial malleolus wound for a considerable amount of time and had the left one down to about 50% of the volume although this is totally regressed over the last 48 weeks. Further the right leg has reopened. she is trying to make a appointment with pain and vascular, previous ablations with Dr. Aleda Grana. I do not believe there is an arterial insufficiency issue here 02/01/15 the status of the adherent eschar bilaterally is actually improved. No debridement was done. She did not manage to get vascular studies done 02/08/15 continued debridement of the area was done today. The slough is less adherent and comes off with less pressure. There is no surrounding infection peripheral pulses are intact 02/15/15 selective debridement with a disposable curette. Again the slough is less adherent and comes off with less difficulty. No surrounding infection peripheral pulses are intact. 02/22/15 selective debridement of the right medial ankle wound. Slough comes off with less difficulty. No obvious surrounding infection peripheral pulses are intact I did not debridement the one on the left. Both of these are stable to improved 03/01/15 selective debridement of both wound areas using a curette to. Adherent slough cup soft with less difficulty. No obvious surrounding infection. The patient tells me that 2 days ago she noted a rash above the right leg wrap. She did not have this on her lower legs when she change this over she arrives with widespread left greater than right almost folliculitis-looking rash which is extremely pruritic. I  don't see anything to culture here. There is no rash on the rest of her body. She feels well systemically. 03/08/15;  selective debridement of both wounds using a curette. Base of this does not look unhealthy. She had limegreen drainage coming out of the left leg wound and describes a lot of drainage. The rash on her left leg looks improved to. No cultures were done. 03/22/15; patient was not here last week. Basal wounds does not look healthy and there is no surrounding erythema. No drainage. There is still a rash on the left leg that almost looks vasculitic however it is clearly limited to the top of where the wrap would be. 04/05/15; on the right required a surgical debridement of surface eschar and necrotic subcutaneous tissue. I did not debridement the area on the left. These continue to be large open wounds that are not changing that much. We were successful at one point in healing the area on the right, and at the same time the area on the left was roughly half the size of current measurements. I think a lot of the deterioration has to do with the prolonged time the patient is on her feet at work 04/19/15 I attempted-like surface debridement bilaterally she does not tolerate this. She tells me that she was in allergic care yesterday with extreme pain over her left lateral malleolus/ankle and was told that she has an "sprain" 05/03/15; large bilateral venous insufficiency wounds over the medial malleolus/medial aspect of her ankles. She complains of copious amounts of drainage and his usual large amounts of pain. There is some increasing erythema around the wound on the right extending into the medial aspect of her foot to. historically she came in with these wounds the right one healed and the left one came down to roughly half its current size however the right one is reopened and the left is expanded. 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 05/10/15 large bilateral venous insufficiency wounds. There is less adherence surface left however the surface culture that I did last week grew pseudomonas therefore bilateral selective debridement score necessary. There is surrounding erythema. The patient describes severe bilateral drainage and a lot of pain in the left ankle. Apparently her podiatrist was were ready to do a cortisone shot 05/17/15; the patient complains of pain and again copious amounts of drainage. 05/24/15; we used Iodo flex last week. Patient notes considerable improvement in wound drainage. Only needed to change this once. 05/31/15; we continued Iodoflex; the base of these large wounds bilaterally is not too bad but there is probably likely a significant bioburden here. I would like to debridement just doesn't tolerate it. 06/06/14 I would like to continue the Iodoflex although she still hasn't managed to obtain supplies. She has bilateral medial malleoli or large wounds which are mostly superficial. Both of them are covered circumferentially with some nonviable fibrinous slough although she tolerates debridement very poorly. She apparently has an appointment for an ablation on the right leg by interventional radiology. 06/14/15; the patient arrives with the wounds and static condition. We attempted a debridement although she does not do well with this secondary to pain. I 07/05/15; wounds are not much smaller however there appears to be a cleaner granulating base. The left has tight fibrinous slough greater than the right. Debridement is tolerated poorly due to pain. Iodoflex is done more for these wounds in any of the multitude of different dressings I have tried on the left 1 and then subsequently the right. 07/12/15; no change in the condition of  this wound. I am able to do an aggressive debridement on the right but not the left. She simply cannot tolerate it. We have been using Iodoflex which helps somewhat. It is  worthwhile remembering that at one point we healed the right medial ankle wound and the left was about 25% of the current circumference. We have suggested returning to vascular surgery for review of possible further ablations for one reason or another she has not been able to do this. 07/26/15 no major change in the condition of either wound on her medial ankle. I did not attempt to debridement of these. She has been aggressively scrubbing these while she is in the shower at home. She has her supply of Iodoflex which seems to have done more for these wounds then anything I have put on recently. 08/09/15 wound area appears larger although not verified by measurements. Using Iodoflex 09/05/2015 -- she was here for avisit today but had significant problems with the wound and I was asked to see her for a physician opinion. I have summarize that this lady has had surgery on her left lower extremity about 10 years ago where the possible veins stripping was done. She has had an opinion from interventional radiology around November 2016 where no further sclerotherapy was ordered. The patient works 12 hours a day and stands on a concrete floor with work boots and is unable to get the proper compression she requires and cannot elevate her limbs appropriately at any given time. She has recently grown Pseudomonas from her wound culture but has not started her ciprofloxacin which was called in for her. 09/13/15 this continues to be a difficult situation for this patient. At one point I had this wound down to a 1.5 x 1.5" wound on her left leg. This is deteriorated and the right leg has reopened. She now has substantial wounds on her medial calcaneus, malleoli and into her lower leg. One on the left has surface eschar but these are far too painful for me to debridement here. She has a vascular surgery appointment next week to see if anything can be done to help here. I think she has had previous ablations several  years ago at [facility]. She has no major edema. She tells me that she did not get product last time [facility]] and went for several days without it. She continues to work in work boots 12 hours a day. She cannot get compression/4-layer under her work boots. 09/20/15 no major change. Periwound edema control was not very good. Her point with pain and vascular is next Wednesday the 25th 09/28/15; the patient is seen vascular surgery and is apparently scheduled for repeat duplex ultrasounds of her bilateral lower legs next week. 10/05/15; the patient was seen by Dr. Doren Custard of vascular surgery. He feels that she should have arterial insufficiency excluded as cause/contributed to her nonhealing stage she is therefore booked for an arteriogram. She has apparently monophasic signals in the dorsalis pedis pulses. She also of course has known severe chronic venous insufficiency with previous procedures as noted previously. I had another long discussion with the patient today about her continuing to work 12 hour shifts. I've written her out for 2 months area had concerns about this as her work location is currently undergoing significant turmoil and this may lead to her termination. She is aware of this however I agree with her that she simply cannot continue to stand for 12 hours multiple days a week with the substantial wound  areas she has. 10/19/15; the Dr. Doren Custard appointment was largely for an arteriogram which was normal. She does not have an arterial issue. He didn't make a comment about her chronic venous insufficiency for which she has had previous ablations. Presumably it was not felt that anything additional could be done. The patient is now out of work as I prescribed 2 weeks ago. Her wounds look somewhat less aggravated presumably because of this. I felt I would give debridement another try today 10/25/15; no major change in this patient's wounds. We are struggling to get her product that she can  afford into her own home through her insurance. 11/01/15; no major change in the patient's wounds. I have been using silver alginate as the most affordable product. I spoke to Dr. Marla Roe last week with her requested take her to the OR for surgical debridement and placement of ACEL. Dr. Marla Roe told me that she would be willing to do this however [facility] will not cover this, fortunately the patient has [place] healthcare of some variant 11/08/15; no major change in the patient's wounds. She has been completely nonviable surface that this but is in too much pain with any attempted debridement are clinic. I have arranged for her to see Dr. Marla Roe ham of plastic surgery and this appointment is on Monday. I am hopeful that they will take her to the OR for debridement, possible ACEL ultimately possible skin graft 11/22/15 no major change in the patient's wounds over her bilateral medial calcaneus medial malleolus into the lower legs. Surface on these does not look too bad however on the left there is surrounding erythema and tenderness. This may be cellulitis or could him sleepy tinea. 11/29/15; no major changes in the patient's wounds over her bilateral medial malleolus. There is no infection here and I don't think any additional antibiotics are necessary. There is now plan to move forward. She sees Dr. Marla Roe in a week's time for preparation for operative debridement and ACEL placement I believe on 7/12. She then has a follow-up appointment with Dr. Marla Roe on 7/21 12/28/15; the patient returns today having been taken to the [place] by Dr. Marla Roe 12/12/15 she underwent debridement, intraoperative cultures [which were negative]. She had placement of a wound VAC. Parent really ACEL was not available to be placed. The wound VAC foam apparently adhered to the wound since then she's been using silver alginate, Xeroform under Ace wraps. She still says there is a lot of drainage and a  lot of pain 01/31/16; this is a patient I see monthly. I had referred her to Dr. Marla Roe him of plastic surgery for large wounds on her bilateral medial ankles. She has been to the OR twice once in early July and once in early August. She tells me over the last 3 weeks she has been using the wound VAC with ACEL underneath it. On the right we've simply been using silver alginate. Under Kerlix Coban wraps. 02/28/16; this is a patient I'm currently seeing monthly. She is gone on to have a skin graft over her large venous insufficiency ulcer on the left medial ankle. This was done by Dr. Marla Roe him. The patient is a bit perturbed about why she didn't have one on her right medial ankle wound. She has been using silver alginate to this. 03/06/16; I received a phone call from her plastic surgery Dr. Marla Roe. She expressed some concern about the viability of the skin graft she did on the left medial ankle wound.  Asked me to place Endoform on this. She told me she is not planning to do a subsequent skin graft on the right as the left one did not take very well. I had placed Hydrofera Blue on the right 03/13/16; continue to have a reasonably healthy wound on the right medial ankle. Down to 3 mm in terms of size. There is epithelialization here. The area on the left medial ankle is her skin graft site. I suppose the last week this looks somewhat better. She has an open area inferiorly however in the center there appears to be some viable tissue. There is a lot of surface callus and eschar that will eventually need to come off however none of this looked to be infected. Patient states that the is able to keep the dressing on for several days which is an improvement. 03/20/16 no major change in the circumference of either wound however on the left side the patient was at Dr. Eusebio Friendly office and they did a debridement of left wound. 50% of the wound seems to be epithelialized. I been using Endoform  on the left Hydrofera Blue in the right 03/27/16; she arrives today with her wound is not looking as healthy as they did last week. The area on the right clearly has an adherent surface to this a very similar surface on the left. Unfortunately for this patient this is all too familiar problem. Clearly the Endoform is not working and will need to change that today that has some potential to help this surface. She does not tolerate debridement in this clinic very well. She is changing the dressing wants 04/03/16; patient arrives with the wounds looking somewhat better especially on the right. Dr. Migdalia Dk change the dressing to silver alginate when she saw her on Monday and also sold her some compression socks. The usefulness of the latter is really not clear and woman with severely draining wounds. 04/10/16; the patient is doing a bit of an experiment wearing the compression stockings that Dr. Migdalia Dk provided her to her left leg and the out of legs based dressings that we provided to the right. 05/01/16; the patient is continuing to wear compression stockings Dr. Migdalia Dk provided her on the left that are apparently silver impregnated. She has been using Iodoflex to the right leg wound. Still a moderate amount of drainage, when she leaves here the wraps only last for 4 days. She has to change the stocking on the left leg every night 05/15/16; she is now using compression stockings bilaterally provided by Dr. Marla Roe. She is wearing a nonadherent layer over the wounds so really I don't think there is anything specific being done to this now. She has some reduction on the left wound. The right is stable. I think all healing here is being done without a specific dressing 06/09/16; patient arrives here today with not much change in the wound certainly in diameter to large circular wounds over the medial aspect of her ankle bilaterally. Under the light of these services are certainly not viable for  healing. There is no evidence of surrounding infection. She is wearing compression stockings with some sort of silver impregnation as prescribed by Dr. Marla Roe. She has a follow-up with her tomorrow. 06/30/16; no major change in the size or condition of her wounds. These are still probably covered with a nonviable surface. She is using only her purchase stockings. She did see Dr. Marla Roe who seemed to want to apply Dakin's solution to this I'm not extreme short  what value this would be. I would suggest Iodoflex which she still has at home. 07/28/16; I follow Mrs. Sloas episodically along with Dr. Marla Roe. 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 She has been applying a topical collagen ointment to both wounds with Adaptic. I don't think Dr. Marla Roe is planning to take her back to the OR. 08/19/16; I follow Mrs. Jeneen Rinks on a monthly basis along with Dr. Marla Roe of plastic surgery. 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 I been following her for a number of years. At one point I was able to get the right medial malleolus wound to heal and had the left medial malleolus down to about half its current size however and I had to send her to plastic surgery for an operative debridement. Since then things have been stable to slightly improve the area on the right is slightly better one in the left about the same although there is much less adherent surface than I'm used to with this patient. She is using some form of liquid collagen gel that Dr. Marla Roe provided a Kerlix cover with the patient's own pressure stockings. She tells me that she has extreme pain in both ankles and along the lateral aspect of both feet. She has been unable to work for some period of time. She is telling me she is retiring at the beginning of April. She sees Dr. Doran Durand of  orthopedics next week 09/22/16; patient has not seen Dr. Marla Roe since the last time she is here. I'm not really sure what she is using to the wounds other than bits and pieces of think she had left over including most recently Hydrofera Blue. She is using juxtalite stockings. She is having difficulty with her husband's recent illness "stroke". She is having to transport him to various doctors appointments. Dr. Marla Roe left her the option of a repeat debridement with ACEL however she has not been able to get the time to follow-up on this. She continues to have a fair amount of drainage out of these wounds with certainly precludes leaving dressings on all week 10/13/16; patient has not seen Dr. Marla Roe since she was last in our clinic. I'm not really sure what she is doing with the wounds, we did try to get her [facility] and I think she is actually using this most of the time. Because of drainage she states she has to change this every second day although this is an improvement from what she used to do. She went to see Dr. Doran Durand who did not think she had a muscular issue with regards to her feet, he referred her to a neurologist and I think the appointment is sometime in June. I changed her back to Iodoflex which she has used in the past but not recently. 11/03/16; the patient has been using Iodoflex although she ran out of this. Still claims that there is a lot of drainage although the wound does not look like this. No surrounding erythema. She has not been back to see Dr. Marla Roe 11/24/16; the patient has been using Iodoflex again but she ran out of it 2 or 3 days ago. There is no major change in the condition of either one of these wounds in fact they are larger and covered in a thick adherent surface slough/nonviable tissue especially on the left. She does not tolerate mechanical debridement in our clinic. Going back  to see Dr. Marla Roe of plastic surgery for an operative  debridement would seem reasonable. 12/15/16; the patient has not been back to see Dr. Marla Roe. She is been dealing with a series of illnesses and her husband which of monopolized her time. She is been using Sorbact which we largely supplied. She states the drainage is bad enough that it maximum she can go 2-3 days without changing the dressing 01/12/2017 -- the patient has not been back for about 4 weeks and has not seen Dr. Marla Roe not does she have any appointment pending. 01/23/17; patient has not seen Dr. Marla Roe even though I suggested this previously. She is using Santyl that was suggested last week by Dr. Con Memos this Cost her $16 through her insurance which is indeed surprising 02/12/17; continuing Santyl and the patient is changing this daily. A lot of drainage. She has not been back to see plastic surgery she is using an Ace wrap. Our intake nurse suggested wrap around stockings which would make a good reasonable alternative 02/26/17; patient is been using Santyl and changing this daily due to drainage. She has not been to see plastic surgery she uses in April Ace wrap to control the edema. She did obtain extremitease stockings but stated that the edema in her leg was to big for these 03/20/17; patient is using Santyl and Anasept. Surfaces looked better today the area on the right is actually measuring a little smaller. She has states she has a lot of pain in her feet and ankles and is asking for a consult to pain control which I'll try to help her with through our case manager. 04/10/17; the patient arrives with better-looking wound surfaces and is slightly smaller wound on the left she is using a combination of Santyl and Anasept. She has an appointment or at least as started in the pain control center associated with [facility] 05/14/17; this is a patient who I followed for a prolonged period of time. 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 At  one point I had this down to a much smaller wound on the left however these reopened and we've never been able to get these to heal. She has been using Santyl and Anasept gel although 2 weeks ago she ran out of the Anasept gel. She has a stable appearance of the wound. She is going to the wound care clinic at [facility]. They wanted do a nerve block/spinal block although she tells me she is reluctant to go forward with that. 05/21/17; this is a patient I have followed for many years. 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 Chronic pain and deformity in her ankles as well. She is been to see plastic surgery as well as orthopedics. Using PolyMem AG most recently/Kerramax/ABDs and 2 layer compression. She has managed to keep this on and she is coming in for a nurse check to change the dressing on Tuesdays, we see her on Fridays 06/05/17; really quite a good looking surface and the area especially on the right medial has contracted in terms of dimensions. Well granulated healthy-looking tissue on both sides. Even with an open curet there is nothing that even feels abnormal here. This is as good as I've seen this in quite some time. We have been using PolyMem AG and bringing her in for a nurse check 06/12/17; really quite good surface on both of these wounds. The right medial has contracted a bit left is not. We've been using  PolyMem and AG and she is coming in for a nurse visit 06/19/17; we have been using PolyMem AG and bringing her in for a nurse check. Dimensions of her wounds are not better but the surfaces looked better bilaterally. She complained of bleeding last night and the left wound and increasing pain bilaterally. She states her wound pain is more neuropathic than just the wounds. There was some suggestion that this was radicular from her pain management doctor in talking to her it is really difficult to sort this out. 06/26/17; using PolyMem and AG and  bringing her in for a nurse check as All of this and reasonably stable condition. Certainly not improved. The dimensions on the lateral part of the right leg look better but not really measuring better. The medial aspect on the left is about the same. 07/03/16; we have been using PolyMen AG and bringing her in for a nurse check to change the dressings as the wounds have drainage which precludes once weekly changing. We are using all secondary absorptive dressings.our intake nurse is brought up the idea of using a wound VAC/snap VAC on the wound to help with the drainage to see if this would result in some contraction. This is not a bad idea. The area on the right medial is actually looking smaller. Both wounds have a reasonable-looking surface. There is no evidence of cellulitis. The edema is well controlled 07/10/17; the patient was denied for a snap VAC by her insurance. The major issue with these wounds continues to be drainage. We are using wicked PolyMem AG and she is coming in for a nurse visit to change this. The wounds are stable to slightly improved. The surface looks vibrant and the area on the right certainly has shrunk in size but very slowly 07/17/17; the patient still has large wounds on her bilateral medial malleoli. Surface of both of these wounds looks better. The dimensions seem to come and go but no consistent improvement. There is no epithelialization. We do not have options for advanced treatment products due to insurance issues. They did not approve of the wound VAC to help control the drainage. More recently we've been using PolyMem and AG wicked to allow drainage through. We have been bringing her in for a nurse visit to change this. We do not have a lot of options for wound care products and the home again due to insurance issues 07/24/17; the patient's wound actually looks somewhat better today. No drainage measurements are smaller still healthy-looking surface. We used silver  collagen under PolyMen started last week. We have been bringing her in for a dressing change 07/31/17; patient's wound surface continued to look better and I think there is visible change in the dimensions of the wound on the right. Rims of epithelialization. We have been using silver collagen under PolyMen and bringing her in for a dressing change. There appears to be less drainage although she is still in need of the dressing change 08/07/17. Patient's wound surface continues to look better on both sides and the area on the right is definitely smaller. We have been using silver collagen and PolyMen. She feels that the drainage has been it has been better. I asked her about her vascular status. She went to see Dr. Aleda Grana at [facility] and had some form of ablation. I don't have much detail on this. I haven't my notes from 2016 that she was not a candidate for any further ablation but I don't have any more  information on this. We had referred her to vein and vascular I don't think she ever went. He does not have a history of PAD although I don't have any information on this either. We don't even have ABIs in our record 08/14/17; we've been using silver collagen and PolyMen cover. And putting the patient and compression. She we are bringing her in as a nurse visit to change this because ofarge amount of drainage. We didn't the ABIs in clinic today since they had been done in many moons 1.2 bilaterally. She has been to see vein and vascular however this was at [facility] and she had ablation although I really don't have any information on this all seemed biking get a report. She is also been operatively debrided by plastic surgery and had a cell placed probably 8-12 months ago. This didn't have a major effect. We've been making some gains with current dressings 08/19/17-She is here in follow-up evaluation for bilateral medial malleoli ulcers. She continues to tolerate debridement very poorly.  We will continue with recently changed topical treatment; if no significant improvement may consider switching to Iodosorb/Iodoflex. 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 She has been using silver collagen and PolyMem. I believe she has been used and tried on Iodoflex before. 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 These have been very refractory over time. We have been using silver collagen and PolyMen. She is coming in once a week for a doctor's and once a week for nurse visits. We are actually making some progress 09/18/17; the patient's wounds are smaller especially on the right medial. She arrives today to upset to consider even washing these off with Anasept which I think is been part of the reason this is been closing. We've been using collagen covered in PolyMen otherwise. It is noted that she has a small area of folliculitis on the right medial calf that. As we are wrapping her legs I'll give her a short course of doxycycline to make sure this doesn't amount to anything. She is a long list of complaints today including imbalance, shortness of breath on exertion, inversion of her left ankle. With regards to the latter complaints she is been to see orthopedics and they offered her a tendon release surgery I believe but wanted her wounds to be closed first. I have recommended she go see her primary physician with regards to everything else. 09/25/17; patient's wounds are about the same size. We have made some progress bilaterally although not in recent weeks. She will not allow me T wash these wounds with Anasept even if she is doing her cell. Wheeze we've been using collagen covered in PolyMen. Last week she had a small area of folliculitis this  is now opened into a small wound. She completed 5 days of trimethoprim sulfamethoxazole 10/02/17; unfortunately the area on her left medial ankle is worse with a larger wound area towards the Achilles. The patient complains of a lot of pain. She will not allow debridement although visually I don't think there is anything to debridement in any case. We have been using silver collagen and PolyMen for several months now. Initially we are making some progress although I'm not really seeing that today. We will move back to [facility]. His admittedly this  is a bit of a repeat however I'm hoping that his situation is different now. The patient tells me she had her leg on the left give out on her yesterday this is process some pain. 10/09/17; the patient is seen twice a week largely because of drainage issues coming out of the chronic medial bimalleolar wounds that are chronic. Last week the dimensions of the one on the left looks a little larger I changed her to [facility]. She comes in today with a history of terrible pain in the bilateral wound areas. She will not allow debridement. She will not even allow a tissue culture. There is no surrounding erythema no no evidence of cellulitis. We have been putting her Kerlix Coban man. She will not allow more aggressive compression as there was a suggestion to put her in 3 layer wraps. 10/16/17; large wounds on her bilateral medial malleoli. These are chronic. Not much change from last week. The surface looks have healthy but absolutely no epithelialization. A lot of pain little less so of drainage. She will not allow debridement or even washing these off in the vigorous fashion with Anasept. 10/23/17; large wounds on her bilateral malleoli which are chronic. Some improvement in terms of size perhaps on the right since last time I saw these. She states that after we increased the 3 layer compression there was some bleeding, when she came in for a nurse  visit she did not want 3 layer compression put back on about our nurse managed to convince her. She has known chronic venous visit issues and I'm hoping to get her to tolerate the 3 layer compression. using Hydrofera Blue 10/30/17; absolutely no change in the condition of either wound although we've had some improvement in dimensions on the right.. Attempted to put her in 3 layer compression she didn't tolerated she is back in 2 layer compression. We've been using Hydrofera Blue We looked over her past records. She had venous reflux studies in November 2016. There was no evidence of deep venous reflux on the right. Superficial vein did not show the greater saphenous vein at think this is been previously ablated the small saphenous vein was within normal limits. The left deep venous system showed no DVT the vessels were positive for deep venous reflux in the posterior tibial veins at the ankle. The greater saphenous vein was surgically absent small saphenous vein was within normal limits. She went to vein and vascular at [facility]. I believe she had an ablation on the left greater saphenous vein. I'll update her reflux studies perhaps ever reviewed by vein and vascular. We've made absolutely no progress in these wounds. Will also try to read and TheraSkins through her insurance 11/06/17; W the patient apparently has a 2 week follow-up with vein and vascular I like him to review the whole issue with regards to her previous vascular workup by Dr. Aleda Grana. We've really made no progress on these wounds in many months. She arrives today with less viable looking surface on the left medial ankle wound. This was apparently looking about the same on Tuesday when she was here for nurse visit. 11/13/17; deep tissue culture I did last time of the left lower leg showed multiple organisms without any predominating. In particular no Staphylococcus or group A strep were isolated. We sent her for venous  reflux studies. She's had a previous left greater saphenous vein stripping and I think sclerotherapy of the right greater saphenous vein. She didn't really look at the  lesser saphenous vein this both wounds are on the medial aspect. She has reflux in the common femoral vein and popliteal vein and an accessory vein on the right and the common femoral vein and popliteal vein on the left. I'm going to have her go to see vein and vascular just the look over things and see if anything else beside aggressive compression is indicated here. We have not been able to make any progress on these wounds in spite of the fact that the surface of the wounds is never look too bad. 11/20/17; no major change in the condition of the wounds. Patient reports a large amount of drainage. She has a lot of complaints of pain although enlisting her today I wonder if some of this at least his neuropathic rather than secondary to her wounds. She has an appointment with vein and vascular on 12/30/17. The refractory nature of these wounds in my mind at least need vein and vascular to look over the wounds the recent reflux studies we did and her history to see if anything further can be done here. I also note her gait is deteriorated quite a bit. 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 She has a bilateral Trendelenburg gait. I wonder if this is neuropathic or perhaps multilevel radicular. 11/27/17; her wounds actually looks slightly better. Healthy-looking granulation tissue a scant amount of epithelialization. [place] healthcare will not pay for [employer]. They will play for tri layer Oasis and Dermagraft. This is not a diabetic ulcer. We'll try for the tri layer Oasis. She still complains of some drainage. She has a vein and vascular appointment on 12/30/17 12/04/17; the wounds visually look quite good. Healthy-looking granulation with some degree of epithelialization. We are still waiting for response to our request  for trial to try layer Oasis. Her appointment with vascular to review venous and arterial issues isn't sold the end of July 7/31. Not allow debridement or even vigorous cleansing of the wound surface. 12/18/17; slightly smaller especially on the right. 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 We've been using Hydrofera Blue. We still are looking into triple layer Oasis through her insurance 01/08/18 on evaluation today patient's wound actually appears to be showing signs of good improvement at this point in time. She has been tolerating the dressing changes without complication. Fortunately there does not appear to be any evidence of infection at this point in time. We have been utilizing silver nitrate which does seem to be of benefit for her which is also good news. Overall I'm very happy with how things seem to be both regards appearance as well as measurement. Patient did see Dr. Bridgett Larsson for evaluation on 12/30/17. In his assessment he felt that stripping would not likely add much more than chronic compression to the patient's healing process. His recommendation was to follow-up in three months with Dr. Doren Custard if she hasn't healed in order to consider referral back to you and see vascular where she previously was in a trial and was able to get her wound to heal. I'll be see what she feels she when you staying compression and he reiterated this as well. 01/13/18 on evaluation today patient appears to actually be doing very well in regard to her bilateral medial malleolus ulcers. She seems to have tolerated the chemical cauterization with silver nitrate last week she did have some pain through that evening but fortunately states that I'll be see since it seems to be doing better she  is overall pleased with the progress. 01/21/18; really quite a remarkable improvement since I've last seen these wounds. We started using silver nitrate specially on the islands of hyper  granulation which for some reason her around the wound circumference. This is really done quite nicely. Primary dressing Hydrofera Blue under 4 layer compression. She seems to be able to hold out without a nurse rewrap. Follow-up in 1 week 01/28/18; we've continued the hydrofera blue but continued with chemical cauterization to the wound area that we started about a month ago for irregular hyper granulation. She is made almost stunning improvement in the overall wound dimensions. I was not really expecting this degree of improvement in these chronic wounds 02/05/18; we continue with Hydrofera Bluebut of also continued the aggressive chemical cauterization with silver nitrate. We made nice progress with the right greater than left wound. 02/12/18. We continued with Hydrofera Blue after aggressive chemical cauterization with silver nitrate. We appear to be making nice progress with both wound areas 02/19/2018; we continue with [facility] after washing the wounds vigorously with Anasept spray and chemical cauterization with silver nitrate. We are making excellent progress. The area on the right's just about closed 02/26/2018. The area on the left medial ankle had too much necrotic debris today. I used a #5 curette we are able to get most of the soft. I continued with the silver nitrate to the much smaller wound on the right medial ankle she had a new area on her right lower pretibial area which she says was due to a role in her compression 03/05/2018; both wound areas look healthy. Not much change in dimensions from last week. I continue to use silver nitrate and Hydrofera Blue. The patient saw Dr. Doren Custard of vein and vascular. He felt she had venous stasis ulcers. He felt based on her previous arteriogram she should have adequate circulation for healing. Also she has deep venous reflux but really no significant correctable superficial venous reflux at this time. He felt we should continue with  conservative management including leg elevation and compression 04/02/2018; since we last saw this woman about a month ago she had a fall apparently suffered a pelvic fracture. I did not look up the x-ray. Nevertheless because of pain she literally was bedbound for 2 weeks and had home health coming out to change the dressing. Somewhat predictably this is resulted in considerable improvement in both wound areas. The right is just about closed on the medial malleolus and the left is about half the size. 04/16/2018; both her wounds continue to go down in size. Using Hydrofera Blue. 05/07/18; both her wounds appeared to be improving especially on the right where it is almost closed. We are using Hydrofera Blue 05/14/2018; slightly worse this week with larger wounds. Surface on the left medial not quite as good. We have been using Hydrofera Blue 05/21/18; again the wounds are slightly larger. Left medial malleolus slightly larger with eschar around the circumference. We have been using Hydrofera Blue undergoing a wraps for a prolonged period of time. This got a lot better when she was more recumbent due to a fall and a back injury. I change the primary dressing the silver alginate today. She did not tolerate a 4 layer compression previously although I may need to bring this up with her next time 05/28/2018; area on the left medial malleolus again is slightly larger with more drainage. Area on the right is roughly unchanged. She has a small area of folliculitis on the  right medial just on the lower calf. This does not look ominous. 06/03/2018 left medial malleolus slightly smaller in a better looking surface. We used silver nitrate on this last time with silver alginate. The area on the right appears slightly smaller 1/10; left medial malleolus slightly smaller. Small open area on the right. We used silver nitrate and silver alginate as of 2 weeks ago. We continue with the wound and compression. These  got a lot better when she was off her feet 1/17; right medial malleolus wound is smaller. The left may be slightly smaller. Both surfaces look somewhat better. 1/24; both wounds are slightly smaller. Using silver alginate under Unna boots 1/31; both wounds appear smaller in fact the area on the right medial is just about closed. Surface eschar. We have been using silver alginate under Unna boots. The patient is less active now spends let much less time on her feet and I think this is contributed to the general improvement in the wound condition 2/7; both wounds appear smaller. I was hopeful the right medial would be closed however there there is still the same small open area. Slight amount of surface eschar on the left the dimensions are smaller there is eschar but the wound edges appear to be free. We have been using silver alginate under Unna boot's 2/14; both wounds once again measure smaller. Circumferential eschar on the left medial. We have been using silver alginate under Unna boots with gradual improvement 2/21; the area on the right medial malleolus has healed. The area on the left is smaller. We have been using silver alginate and Unna boots. We can discharge wrapping the right leg she has 20/30 stockings at home she will need to protect the scar tissue in this area 2/28; the area on the right medial malleolus remains closed the patient has a compression stocking. The area on the left is smaller. We have been using silver alginate and Unna boots. 3/6 the area on the right medial ankle remains closed. Good edema control noted she is using her own compression stocking. The area on the left medial ankle is smaller. We have been managing this with silver alginate and Unna boots which we will continue today. 3/13; the area on the right medial ankle remains closed and I'm declaring it healed today. When necessary the left is about the same still a healthy-looking surface but no major change  and wound area. No evidence of infection and using silver alginate under unna and generally making considerable improvement 3/27 the area on the right medial ankle remains closed the area on the left is about the same as last week. Certainly not any worse we have been using silver alginate under an Unna boot 4/3; the area on the right medial ankle remains closed per the patient. We did not look at this wound. The wound on the left medial ankle is about the same surface looks healthy we have been using silver alginate under an Unna boot 4/10; area on the right medial ankle remains closed per the patient. We did not look at this wound. The wound on the left medial ankle is slightly larger. The patient complains that the [facility] caused burning pain all week. She also told us that she was a lot more active this week. Changed her back to silver alginate 4/17; right medial ankle still closed per the patient. Left medial ankle is slightly larger. Using silver alginate. She did not tolerate Hydrofera Blue on this area 4/24; right  medial ankle remains closed we have not look at this. The left medial ankle continues to get larger today by about a centimeter. We have been using silver alginate under Unna boots. She complains about 4 layer compression as an alternative. She has been up on her feet working on her garden 5/8; right medial ankle remains closed we did not look at this. The left medial ankle has increased in size about 100%. We have been using silver alginate under Unna boots. She noted increased pain this week and was not surprised that the wound is deteriorated 5/15; no major change in SA however much less erythema ( one week of doxy ocellulitis). 5/22-64 year old female returns at 1 week to the clinic for left medial ankle wound for which we have been using silver alginate under 3 layer compression She was placed on DOXY at last visit - the wound is wider at this visit. She is in 3  layer compression 5/29; change to [facility] last week. I had given her empiric doxycycline 2 weeks ago for a week. She is in 3 layer compression. She complains of a lot of pain and drainage on presentation today. 6/5; using Hydrofera Blue. I gave her doxycycline recently empirically for erythema and pain around the wound. Believe her cultures showed enterococcus which not would not have been well covered by doxycycline nevertheless the wound looks better and I don't feel specifically that the enterococcus needs to be covered. She has a new what looks like a wrap injury on her lateral left ankle. 6/12; she is using Hydrofera Blue. She has a new area on the left anterior lower tibial area. This was a wrap injury last week. 6/19; the patient is using Hydrofera Blue. She arrived with marked inflammation and erythema around the wound and tenderness. 12/01/18 on evaluation today patient appears to be doing a little bit better based on what I'm hearing from the standpoint of lassos evaluation to this as far as the overall appearance of the wound is concerned. Then sometime substandard she typically sees Dr. Dellia Nims. Nonetheless overall very pleased with the progress that she's made up to this point. No fevers, chills, nausea, or vomiting noted at this time. 7/10; some improvement in the surface area. Aggressively debrided last week apparently. I went ahead with the debridement today although the patient does not tolerate this very well. We have been using Iodoflex. Still a fair amount of drainage 7/17; slightly smaller. Using Iodoflex. 7/24; no change from last week in terms of surface area. We have been using Iodoflex. Surface looks and continues to look somewhat better 7/31; surface area slightly smaller better looking surface. We have been using Iodoflex. This is under Unna boot compression 8/7-Patient presents at 1 week with Unna boot and Iodoflex, wound appears better 8/14-Patient presents at  1 week with Iodoflex, we use the Unna boot, wound appears to be stable better.Patient is getting Botox treatment for the inversion of the foot for tendon release, Next week 8/21; we are using Iodoflex. Unna boot. The wound is stable in terms of surface area. Under illumination there is some areas of the wound that appear to be either epithelialized or perhaps this is adherent slough at this point I was not really clear. It did not wipe off and I was reluctant to debride this today. 8/28; we are using Iodoflex in an Unna boot. Seems to be making good improvement. 9/4; using Iodoflex and wound is slightly smaller. 9/18; we are using Iodoflex with topical silver  nitrate when she is here. The wound continues to be smaller 10/2; patient missed her appointment last week due to GI issues. She left and Iodoflex based dressing on for 2 weeks. Wound is about the same size about the size of a dime on the left medial lower 10/9 we have been using Iodoflex on the medial left ankle wound. She has a new superficial probable wrap injury on the dorsal left ankle 10/16; we have been using Hydrofera Blue since last week. This is on the left medial ankle 10/23; we have been using Hydrofera Blue since 2 weeks ago. This is on the left medial ankle. Dimensions are better 11/6; using Hydrofera Blue. I think the wound is smaller but still not closed. Left medial ankle 11/13; we have been using Hydrofera Blue. Wound is certainly no smaller this week. Also the surface not as good. This is the remanent of a very large area on her left medial ankle. 11/20; using Sorbact since last week. Wound was about the same in terms of size although I was disappointed about the surface debris 12/11; 3-week follow-up. Patient was on vacation. Wound is measuring slightly larger we have been using Sorbact. 12/18; wound is about the same size however surface looks better last week after debridement. We have been using Sorbact under  compression 1/15 wound is probably twice the size of last time increased in length nonviable surface. We have been using Sorbact. She was running a mild fever and missed her appointment last week 1/22; the wound is come down in size but under illumination still a very adherent debris we have been Hydrofera Blue that I changed her to last week Electronic Signature(s) Signed: 06/24/2019 5:37:40 PM By: Linton Ham MD Entered By: Linton Ham on 06/24/2019 11:22:17 -------------------------------------------------------------------------------- Physical Exam Details Patient Name: Date of Service: [name] 06/24/2019 10:00 AM Medical Record [id number] Patient Account Number: [id number] Date of Birth/Sex: Treating RN: 06/12/1955 (64 y.o. F) Primary Care Provider: [name] Other Clinician: Referring Provider: Treating Provider/Extender:Hattye Siegfried, Velva Harman, Essie Christine in Treatment: 341 Notes Wound exam; left medial malleolus. Under illumination nonviable surface. Debrided with a #5 curette I am able to get this better looking in the clinic however however each time she comes in it has the same very adherent debris. Hemostasis with direct pressure. Electronic Signature(s) Signed: 06/24/2019 5:37:40 PM By: Linton Ham MD Entered By: Linton Ham on 06/24/2019 11:23:00 -------------------------------------------------------------------------------- Physician Orders Details Patient Name: Date of Service: [name] 06/24/2019 10:00 AM Medical Record [id number] Patient Account Number: [id number] Date of Birth/Sex: Treating RN: 1955-09-17 (64 y.o. 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 - Friday Dressing Change Frequency Wound #3 Left,Medial Malleolus Do not change entire dressing for one week. - change dressing in one week Skin Barriers/Peri-Wound Care Moisturizing lotion TCA Cream or Ointment - mixed with lotion Wound Cleansing Clean wound with Wound Cleanser - clean with anasept with dressing changes May shower with protection. Primary Wound Dressing Wound #3 Left,Medial Malleolus Hydrofera Blue - classic Secondary Dressing Wound #3 Left,Medial Malleolus Dry Gauze Edema Control Unna Boot to Left Lower Extremity - no kerlix layer Avoid standing for long periods of time  Elevate legs to the level of the heart or above for 30 minutes daily and/or when sitting, a frequency of: - throughout the day Support Garment 20-30 mm/Hg pressure to: - compression stocking right leg daily Electronic Signature(s) Signed: 06/24/2019 5:37:40 PM By: Linton Ham MD Signed: 06/24/2019 5:39:40 PM By: Kela Millin Entered By: Kela Millin on 06/24/2019 10:07:59 -------------------------------------------------------------------------------- Problem List Details Patient Name: Date of Service: [name] 06/24/2019 10:00 AM Medical Record [id number] Patient Account Number: [id number] Date of Birth/Sex: Treating RN: [birth year] (64 y.o. 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 11/16/2014 No Yes of  calf and inflammation Inactive Problems ICD-10 Code Description Active Date Inactive Date I83.223 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 08/03/2014 08/03/2014 and inflammation L03.116 Cellulitis of left lower limb 09/07/2014 09/07/2014 L97.321 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 F) Primary Care Provider: [name] Other Clinician: Referring Provider: Treating Provider/Extender:Rianna Lukes, Velva Harman, Essie Christine in Treatment: 341 Subjective History of Present Illness (HPI) the remaining wound is over the left medial ankle. Similar wound over the right medial ankle healed largely with use of Apligraf. Most recently we have been using Hydrofera Blue over this wound with considerable improvement. The patient has been extensively worked up in the past for her venous insufficiency and she is not a candidate for antireflux surgery although I have none of the details available currently. 08/24/14; considerable improvement today. About 50% of this wound areas now epithelialized. The base of the wound appears to be healthier granulation.as opposed to last week when she had deteriorated a considerable improvement 08/17/14; unfortunately the wound has regressed somewhat. The areas of epithelialization from the superior aspect are not nearly as  healthy as they were last week. The patient thinks her Hydrofera Blue slipped. 09/07/14; unfortunately the area has markedly regressed in the 2 weeks since I've seen this. There is an odor surrounding erythema. The healthy granulation tissue that we had at the base of the wound now is a dusky color. The nurse reports green drainage 09/14/14; the area looks somewhat better than last week. There is less erythema and less drainage. The culture I did did not show any growth. Nevertheless I think it is better to continue the Cipro and doxycycline for a further week. The remaining wound area was debridement. 09/21/14. Wound did not require debridement last week. Still less erythema and less drainage. She can complete her antibiotics. The areas of epithelialization in the superior aspect of the wound do not look as healthy as they did some weeks ago 10/05/14 continued improvement in the condition of this wound. There is advancing epithelialization. Less aggressive debridement required 10/19/14 continued improvement in the condition and volume of this wound. Less aggressive debridement to the inferior part of this  to remove surface slough and fibrinous eschar 11/02/14 no debridement is required. The surface granulation appears healthy although some of her islands of epithelialization seem to have regressed. No evidence of infection 11/16/14; lites surface debridement done of surface eschar. The wound does not look to be unhealthy. No evidence of infection. Unfortunately the patient has had podiatry issues in the right foot and for some reason has redeveloped small surface ulcerations in the medial right ankle. Her original presentation involved wounds in this area 11/23/14 no debridement. The area on the right ankle has enlarged. The left ankle wound appears stable in terms of the surface although there is periwound inflammation. There has been regression in the amount of new skin 11/30/14 no debridement. Both  wound areas appear healthy. There was no evidence of infection. The the new area on the right medial ankle has enlarged although that both the surfaces appear to be stable. 12/07/14; Debridement of the right medial ankle wound. No no debridement was done on the left. 12/14/14 no major change in and now bilateral medial ankle wounds. Both of these are very painful but the no overt evidence of infection. She has had previous venous ablation 12/21/14; patient states that her right medial ankle wound is considerably more painful last week than usual. Her left is also somewhat painful. She could not tolerate debridement. The right medial ankle wound has fibrinous surface eschar 12/28/14 this is a patient with severe bilateral venous insufficiency ulcers. For a considerable period of time we actually had the one on the right medial ankle healed however this recently opened up again in June. The left medial ankle wound has been a refractory area with some absent flows. We had some success with Hydrofera Blue on this area and it literally closed by 50% however it is recently opened up Foley. Both of these were debridement today of surface eschar. She tolerates this poorly 01/25/15: No change in the status of this. Thick adherent escar. Very poor tolerance of any attempt at debridement. I had healed the right medial malleolus wound for a considerable amount of time and had the left one down to about 50% of the volume although this is totally regressed over the last 48 weeks. Further the right leg has reopened. she is trying to make a appointment with pain and vascular, previous ablations with Dr. Aleda Grana. I do not believe there is an arterial insufficiency issue here 02/01/15 the status of the adherent eschar bilaterally is actually improved. No debridement was done. She did not manage to get vascular studies done 02/08/15 continued debridement of the area was done today. The slough is less adherent and comes  off with less pressure. There is no surrounding infection peripheral pulses are intact 02/15/15 selective debridement with a disposable curette. Again the slough is less adherent and comes off with less difficulty. No surrounding infection peripheral pulses are intact. 02/22/15 selective debridement of the right medial ankle wound. Slough comes off with less difficulty. No obvious surrounding infection peripheral pulses are intact I did not debridement the one on the left. Both of these are stable to improved 03/01/15 selective debridement of both wound areas using a curette to. Adherent slough cup soft with less difficulty. No obvious surrounding infection. The patient tells me that 2 days ago she noted a rash above the right leg wrap. She did not have this on her lower legs when she change this over she arrives with widespread left greater than right almost folliculitis-looking rash which is extremely  pruritic. I don't see anything to culture here. There is no rash on the rest of her body. She feels well systemically. 03/08/15; selective debridement of both wounds using a curette. Base of this does not look unhealthy. She had limegreen drainage coming out of the left leg wound and describes a lot of drainage. The rash on her left leg looks improved to. No cultures were done. 03/22/15; patient was not here last week. Basal wounds does not look healthy and there is no surrounding erythema. No drainage. There is still a rash on the left leg that almost looks vasculitic however it is clearly limited to the top of where the wrap would be. 04/05/15; on the right required a surgical debridement of surface eschar and necrotic subcutaneous tissue. I did not debridement the area on the left. These continue to be large open wounds that are not changing that much. We were successful at one point in healing the area on the right, and at the same time the area on the left was roughly half the size of current  measurements. I think a lot of the deterioration has to do with the prolonged time the patient is on her feet at work 04/19/15 I attempted-like surface debridement bilaterally she does not tolerate this. She tells me that she was in allergic care yesterday with extreme pain over her left lateral malleolus/ankle and was told that she has an "sprain" 05/03/15; large bilateral venous insufficiency wounds over the medial malleolus/medial aspect of her ankles. She complains of copious amounts of drainage and his usual large amounts of pain. There is some increasing erythema around the wound on the right extending into the medial aspect of her foot to. historically she came in with these wounds the right one healed and the left one came down to roughly half its current size however the right one is reopened and the left is expanded. 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 05/10/15 large bilateral venous insufficiency wounds. There is less adherence surface left however the surface culture that I did last week grew pseudomonas therefore bilateral selective debridement score necessary. There is surrounding erythema. The patient describes severe bilateral drainage and a lot of pain in the left ankle. Apparently her podiatrist was were ready to do a cortisone shot 05/17/15; the patient complains of pain and again copious amounts of drainage. 05/24/15; we used Iodo flex last week. Patient notes considerable improvement in wound drainage. Only needed to change this once. 05/31/15; we continued Iodoflex; the base of these large wounds bilaterally is not too bad but there is probably likely a significant bioburden here. I would like to debridement just doesn't tolerate it. 06/06/14 I would like to continue the Iodoflex although she still hasn't managed to obtain supplies. She has bilateral medial malleoli or large wounds which are mostly superficial. Both of  them are covered circumferentially with some nonviable fibrinous slough although she tolerates debridement very poorly. She apparently has an appointment for an ablation on the right leg by interventional radiology. 06/14/15; the patient arrives with the wounds and static condition. We attempted a debridement although she does not do well with this secondary to pain. I 07/05/15; wounds are not much smaller however there appears to be a cleaner granulating base. The left has tight fibrinous slough greater than the right. Debridement is tolerated poorly due to pain. Iodoflex is done more for these wounds in any of  the multitude of different dressings I have tried on the left 1 and then subsequently the right. 07/12/15; no change in the condition of this wound. I am able to do an aggressive debridement on the right but not the left. She simply cannot tolerate it. We have been using Iodoflex which helps somewhat. It is worthwhile remembering that at one point we healed the right medial ankle wound and the left was about 25% of the current circumference. We have suggested returning to vascular surgery for review of possible further ablations for one reason or another she has not been able to do this. 07/26/15 no major change in the condition of either wound on her medial ankle. I did not attempt to debridement of these. She has been aggressively scrubbing these while she is in the shower at home. She has her supply of Iodoflex which seems to have done more for these wounds then anything I have put on recently. 08/09/15 wound area appears larger although not verified by measurements. Using Iodoflex 09/05/2015 -- she was here for avisit today but had significant problems with the wound and I was asked to see her for a physician opinion. I have summarize that this lady has had surgery on her left lower extremity about 10 years ago where the possible veins stripping was done. She has had an opinion from  interventional radiology around November 2016 where no further sclerotherapy was ordered. The patient works 12 hours a day and stands on a concrete floor with work boots and is unable to get the proper compression she requires and cannot elevate her limbs appropriately at any given time. She has recently grown Pseudomonas from her wound culture but has not started her ciprofloxacin which was called in for her. 09/13/15 this continues to be a difficult situation for this patient. At one point I had this wound down to a 1.5 x 1.5" wound on her left leg. This is deteriorated and the right leg has reopened. She now has substantial wounds on her medial calcaneus, malleoli and into her lower leg. One on the left has surface eschar but these are far too painful for me to debridement here. She has a vascular surgery appointment next week to see if anything can be done to help here. I think she has had previous ablations several years ago at [facility]. She has no major edema. She tells me that she did not get product last time [facility]] and went for several days without it. She continues to work in work boots 12 hours a day. She cannot get compression/4-layer under her work boots. 09/20/15 no major change. Periwound edema control was not very good. Her point with pain and vascular is next Wednesday the 25th 09/28/15; the patient is seen vascular surgery and is apparently scheduled for repeat duplex ultrasounds of her bilateral lower legs next week. 10/05/15; the patient was seen by Dr. Doren Custard of vascular surgery. He feels that she should have arterial insufficiency excluded as cause/contributed to her nonhealing stage she is therefore booked for an arteriogram. She has apparently monophasic signals in the dorsalis pedis pulses. She also of course has known severe chronic venous insufficiency with previous procedures as noted previously. I had another long discussion with the patient today about her  continuing to work 12 hour shifts. I've written her out for 2 months area had concerns about this as her work location is currently undergoing significant turmoil and this may lead to her termination. She is aware of  this however I agree with her that she simply cannot continue to stand for 12 hours multiple days a week with the substantial wound areas she has. 10/19/15; the Dr. Doren Custard appointment was largely for an arteriogram which was normal. She does not have an arterial issue. He didn't make a comment about her chronic venous insufficiency for which she has had previous ablations. Presumably it was not felt that anything additional could be done. The patient is now out of work as I prescribed 2 weeks ago. Her wounds look somewhat less aggravated presumably because of this. I felt I would give debridement another try today 10/25/15; no major change in this patient's wounds. We are struggling to get her product that she can afford into her own home through her insurance. 11/01/15; no major change in the patient's wounds. I have been using silver alginate as the most affordable product. I spoke to Dr. Marla Roe last week with her requested take her to the OR for surgical debridement and placement of ACEL. Dr. Marla Roe told me that she would be willing to do this however [facility] will not cover this, fortunately the patient has [place] healthcare of some variant 11/08/15; no major change in the patient's wounds. She has been completely nonviable surface that this but is in too much pain with any attempted debridement are clinic. I have arranged for her to see Dr. Marla Roe ham of plastic surgery and this appointment is on Monday. I am hopeful that they will take her to the OR for debridement, possible ACEL ultimately possible skin graft 11/22/15 no major change in the patient's wounds over her bilateral medial calcaneus medial malleolus into the lower legs. Surface on these does not  look too bad however on the left there is surrounding erythema and tenderness. This may be cellulitis or could him sleepy tinea. 11/29/15; no major changes in the patient's wounds over her bilateral medial malleolus. There is no infection here and I don't think any additional antibiotics are necessary. There is now plan to move forward. She sees Dr. Marla Roe in a week's time for preparation for operative debridement and ACEL placement I believe on 7/12. She then has a follow-up appointment with Dr. Marla Roe on 7/21 12/28/15; the patient returns today having been taken to the [place] by Dr. Marla Roe 12/12/15 she underwent debridement, intraoperative cultures [which were negative]. She had placement of a wound VAC. Parent really ACEL was not available to be placed. The wound VAC foam apparently adhered to the wound since then she's been using silver alginate, Xeroform under Ace wraps. She still says there is a lot of drainage and a lot of pain 01/31/16; this is a patient I see monthly. I had referred her to Dr. Marla Roe him of plastic surgery for large wounds on her bilateral medial ankles. She has been to the OR twice once in early July and once in early August. She tells me over the last 3 weeks she has been using the wound VAC with ACEL underneath it. On the right we've simply been using silver alginate. Under Kerlix Coban wraps. 02/28/16; this is a patient I'm currently seeing monthly. She is gone on to have a skin graft over her large venous insufficiency ulcer on the left medial ankle. This was done by Dr. Marla Roe him. The patient is a bit perturbed about why she didn't have one on her right medial ankle wound. She has been using silver alginate to this. 03/06/16; I received a phone call from her  plastic surgery Dr. Marla Roe. She expressed some concern about the viability of the skin graft she did on the left medial ankle wound. Asked me to place Endoform on this. She told me she is not  planning to do a subsequent skin graft on the right as the left one did not take very well. I had placed Hydrofera Blue on the right 03/13/16; continue to have a reasonably healthy wound on the right medial ankle. Down to 3 mm in terms of size. There is epithelialization here. The area on the left medial ankle is her skin graft site. I suppose the last week this looks somewhat better. She has an open area inferiorly however in the center there appears to be some viable tissue. There is a lot of surface callus and eschar that will eventually need to come off however none of this looked to be infected. Patient states that the is able to keep the dressing on for several days which is an improvement. 03/20/16 no major change in the circumference of either wound however on the left side the patient was at Dr. Eusebio Friendly office and they did a debridement of left wound. 50% of the wound seems to be epithelialized. I been using Endoform on the left Hydrofera Blue in the right 03/27/16; she arrives today with her wound is not looking as healthy as they did last week. The area on the right clearly has an adherent surface to this a very similar surface on the left. Unfortunately for this patient this is all too familiar problem. Clearly the Endoform is not working and will need to change that today that has some potential to help this surface. She does not tolerate debridement in this clinic very well. She is changing the dressing wants 04/03/16; patient arrives with the wounds looking somewhat better especially on the right. Dr. Migdalia Dk change the dressing to silver alginate when she saw her on Monday and also sold her some compression socks. The usefulness of the latter is really not clear and woman with severely draining wounds. 04/10/16; the patient is doing a bit of an experiment wearing the compression stockings that Dr. Migdalia Dk provided her to her left leg and the out of legs based dressings that we  provided to the right. 05/01/16; the patient is continuing to wear compression stockings Dr. Migdalia Dk provided her on the left that are apparently silver impregnated. She has been using Iodoflex to the right leg wound. Still a moderate amount of drainage, when she leaves here the wraps only last for 4 days. She has to change the stocking on the left leg every night 05/15/16; she is now using compression stockings bilaterally provided by Dr. Marla Roe. She is wearing a nonadherent layer over the wounds so really I don't think there is anything specific being done to this now. She has some reduction on the left wound. The right is stable. I think all healing here is being done without a specific dressing 06/09/16; patient arrives here today with not much change in the wound certainly in diameter to large circular wounds over the medial aspect of her ankle bilaterally. Under the light of these services are certainly not viable for healing. There is no evidence of surrounding infection. She is wearing compression stockings with some sort of silver impregnation as prescribed by Dr. Marla Roe. She has a follow-up with her tomorrow. 06/30/16; no major change in the size or condition of her wounds. These are still probably covered with a nonviable surface. She is using  only her purchase stockings. She did see Dr. Marla Roe who seemed to want to apply Dakin's solution to this I'm not extreme short what value this would be. I would suggest Iodoflex which she still has at home. 07/28/16; I follow Mrs. Jamrog episodically along with Dr. Marla Roe. 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 She has been applying a topical collagen ointment to both wounds with Adaptic. I don't think Dr. Marla Roe is planning to take her back to the OR. 08/19/16; I follow Mrs. Jeneen Rinks on a monthly basis along with Dr. Marla Roe of plastic surgery. She has very  refractory venous insufficiency wounds on the bilateral medial lower legs left greater than right. I been following her for a number of years. At one point I was able to get the right medial malleolus wound to heal and had the left medial malleolus down to about half its current size however and I had to send her to plastic surgery for an operative debridement. Since then things have been stable to slightly improve the area on the right is slightly better one in the left about the same although there is much less adherent surface than I'm used to with this patient. She is using some form of liquid collagen gel that Dr. Marla Roe provided a Kerlix cover with the patient's own pressure stockings. She tells me that she has extreme pain in both ankles and along the lateral aspect of both feet. She has been unable to work for some period of time. She is telling me she is retiring at the beginning of April. She sees Dr. Doran Durand of orthopedics next week 09/22/16; patient has not seen Dr. Marla Roe since the last time she is here. I'm not really sure what she is using to the wounds other than bits and pieces of think she had left over including most recently Hydrofera Blue. She is using juxtalite stockings. She is having difficulty with her husband's recent illness "stroke". She is having to transport him to various doctors appointments. Dr. Marla Roe left her the option of a repeat debridement with ACEL however she has not been able to get the time to follow-up on this. She continues to have a fair amount of drainage out of these wounds with certainly precludes leaving dressings on all week 10/13/16; patient has not seen Dr. Marla Roe since she was last in our clinic. I'm not really sure what she is doing with the wounds, we did try to get her [facility] and I think she is actually using this most of the time. Because of drainage she states she has to change this every second day although this is an  improvement from what she used to do. She went to see Dr. Doran Durand who did not think she had a muscular issue with regards to her feet, he referred her to a neurologist and I think the appointment is sometime in June. I changed her back to Iodoflex which she has used in the past but not recently. 11/03/16; the patient has been using Iodoflex although she ran out of this. Still claims that there is a lot of drainage although the wound does not look like this. No surrounding erythema. She has not been back to see Dr. Marla Roe 11/24/16; the patient has been using Iodoflex again but she ran out of it 2 or 3 days ago. There is no major change in the condition of either one of these wounds in fact they are larger  and covered in a thick adherent surface slough/nonviable tissue especially on the left. She does not tolerate mechanical debridement in our clinic. Going back to see Dr. Marla Roe of plastic surgery for an operative debridement would seem reasonable. 12/15/16; the patient has not been back to see Dr. Marla Roe. She is been dealing with a series of illnesses and her husband which of monopolized her time. She is been using Sorbact which we largely supplied. She states the drainage is bad enough that it maximum she can go 2-3 days without changing the dressing 01/12/2017 -- the patient has not been back for about 4 weeks and has not seen Dr. Marla Roe not does she have any appointment pending. 01/23/17; patient has not seen Dr. Marla Roe even though I suggested this previously. She is using Santyl that was suggested last week by Dr. Con Memos this Cost her $16 through her insurance which is indeed surprising 02/12/17; continuing Santyl and the patient is changing this daily. A lot of drainage. She has not been back to see plastic surgery she is using an Ace wrap. Our intake nurse suggested wrap around stockings which would make a good reasonable alternative 02/26/17; patient is been using Santyl and  changing this daily due to drainage. She has not been to see plastic surgery she uses in April Ace wrap to control the edema. She did obtain extremitease stockings but stated that the edema in her leg was to big for these 03/20/17; patient is using Santyl and Anasept. Surfaces looked better today the area on the right is actually measuring a little smaller. She has states she has a lot of pain in her feet and ankles and is asking for a consult to pain control which I'll try to help her with through our case manager. 04/10/17; the patient arrives with better-looking wound surfaces and is slightly smaller wound on the left she is using a combination of Santyl and Anasept. She has an appointment or at least as started in the pain control center associated with [facility] 05/14/17; this is a patient who I followed for a prolonged period of time. 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 At one point I had this down to a much smaller wound on the left however these reopened and we've never been able to get these to heal. She has been using Santyl and Anasept gel although 2 weeks ago she ran out of the Anasept gel. She has a stable appearance of the wound. She is going to the wound care clinic at [facility] They wanted do a nerve block/spinal block although she tells me she is reluctant to go forward with that. 05/21/17; this is a patient I have followed for many years. 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 Chronic pain and deformity in her ankles as well. She is been to see plastic surgery as well as orthopedics. Using PolyMem AG most recently/Kerramax/ABDs and 2 layer compression. She has managed to keep this on and she is coming in for a nurse check to change the dressing on Tuesdays, we see her on Fridays 06/05/17; really quite a good looking surface and the area especially on the right medial has contracted in terms of dimensions.  Well granulated healthy-looking tissue on both sides. Even with an open curet there is nothing that even feels abnormal here. This is as good as I've seen this in quite some time. We have been using PolyMem AG and bringing her in for a nurse  check 06/12/17; really quite good surface on both of these wounds. The right medial has contracted a bit left is not. We've been using PolyMem and AG and she is coming in for a nurse visit 06/19/17; we have been using PolyMem AG and bringing her in for a nurse check. Dimensions of her wounds are not better but the surfaces looked better bilaterally. She complained of bleeding last night and the left wound and increasing pain bilaterally. She states her wound pain is more neuropathic than just the wounds. There was some suggestion that this was radicular from her pain management doctor in talking to her it is really difficult to sort this out. 06/26/17; using PolyMem and AG and bringing her in for a nurse check as All of this and reasonably stable condition. Certainly not improved. The dimensions on the lateral part of the right leg look better but not really measuring better. The medial aspect on the left is about the same. 07/03/16; we have been using PolyMen AG and bringing her in for a nurse check to change the dressings as the wounds have drainage which precludes once weekly changing. We are using all secondary absorptive dressings.our intake nurse is brought up the idea of using a wound VAC/snap VAC on the wound to help with the drainage to see if this would result in some contraction. This is not a bad idea. The area on the right medial is actually looking smaller. Both wounds have a reasonable-looking surface. There is no evidence of cellulitis. The edema is well controlled 07/10/17; the patient was denied for a snap VAC by her insurance. The major issue with these wounds continues to be drainage. We are using wicked PolyMem AG and she is coming in for a  nurse visit to change this. The wounds are stable to slightly improved. The surface looks vibrant and the area on the right certainly has shrunk in size but very slowly 07/17/17; the patient still has large wounds on her bilateral medial malleoli. Surface of both of these wounds looks better. The dimensions seem to come and go but no consistent improvement. There is no epithelialization. We do not have options for advanced treatment products due to insurance issues. They did not approve of the wound VAC to help control the drainage. More recently we've been using PolyMem and AG wicked to allow drainage through. We have been bringing her in for a nurse visit to change this. We do not have a lot of options for wound care products and the home again due to insurance issues 07/24/17; the patient's wound actually looks somewhat better today. No drainage measurements are smaller still healthy-looking surface. We used silver collagen under PolyMen started last week. We have been bringing her in for a dressing change 07/31/17; patient's wound surface continued to look better and I think there is visible change in the dimensions of the wound on the right. Rims of epithelialization. We have been using silver collagen under PolyMen and bringing her in for a dressing change. There appears to be less drainage although she is still in need of the dressing change 08/07/17. Patient's wound surface continues to look better on both sides and the area on the right is definitely smaller. We have been using silver collagen and PolyMen. She feels that the drainage has been it has been better. I asked her about her vascular status. She went to see Dr. Aleda Grana at [facility] and had some form of ablation. I don't have much detail on  this. I haven't my notes from 2016 that she was not a candidate for any further ablation but I don't have any more information on this. We had referred her to vein and vascular I don't think  she ever went. He does not have a history of PAD although I don't have any information on this either. We don't even have ABIs in our record 08/14/17; we've been using silver collagen and PolyMen cover. And putting the patient and compression. She we are bringing her in as a nurse visit to change this because ofarge amount of drainage. We didn't the ABIs in clinic today since they had been done in many moons 1.2 bilaterally. She has been to see vein and vascular however this was at [facility] and she had ablation although I really don't have any information on this all seemed biking get a report. She is also been operatively debrided by plastic surgery and had a cell placed probably 8-12 months ago. This didn't have a major effect. We've been making some gains with current dressings 08/19/17-She is here in follow-up evaluation for bilateral medial malleoli ulcers. She continues to tolerate debridement very poorly. We will continue with recently changed topical treatment; if no significant improvement may consider switching to Iodosorb/Iodoflex. 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 She has been using silver collagen and PolyMem. I believe she has been used and tried on Iodoflex before. 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 These have been very refractory over time. We have been using silver collagen and PolyMen. She is coming in once a week for a doctor's and once a week for nurse visits. We are actually making some progress 09/18/17; the patient's wounds are smaller especially on the right medial. She arrives today to upset to consider even washing these off with Anasept which I think is been part of the reason this is been closing. We've been  using collagen covered in PolyMen otherwise. It is noted that she has a small area of folliculitis on the right medial calf that. As we are wrapping her legs I'll give her a short course of doxycycline to make sure this doesn't amount to anything. She is a long list of complaints today including imbalance, shortness of breath on exertion, inversion of her left ankle. With regards to the latter complaints she is been to see orthopedics and they offered her a tendon release surgery I believe but wanted her wounds to be closed first. I have recommended she go see her primary physician with regards to everything else. 09/25/17; patient's wounds are about the same size. We have made some progress bilaterally although not in recent weeks. She will not allow me T wash these wounds with Anasept even if she is doing her cell. Wheeze we've been using collagen covered in PolyMen. Last week she had a small area of folliculitis this is now opened into a small wound. She completed 5 days of trimethoprim sulfamethoxazole 10/02/17; unfortunately the area on her left medial ankle is worse with a larger wound area towards the Achilles. The patient complains of a lot of pain. She will not allow debridement although visually I don't think there is anything to debridement in any case. We have been using silver collagen and PolyMen for several months now.  Initially we are making some progress although I'm not really seeing that today. We will move back to [facility]. His admittedly this is a bit of a repeat however I'm hoping that his situation is different now. The patient tells me she had her leg on the left give out on her yesterday this is process some pain. 10/09/17; the patient is seen twice a week largely because of drainage issues coming out of the chronic medial bimalleolar wounds that are chronic. Last week the dimensions of the one on the left looks a little larger I changed her to [facility]. She comes  in today with a history of terrible pain in the bilateral wound areas. She will not allow debridement. She will not even allow a tissue culture. There is no surrounding erythema no no evidence of cellulitis. We have been putting her Kerlix Coban man. She will not allow more aggressive compression as there was a suggestion to put her in 3 layer wraps. 10/16/17; large wounds on her bilateral medial malleoli. These are chronic. Not much change from last week. The surface looks have healthy but absolutely no epithelialization. A lot of pain little less so of drainage. She will not allow debridement or even washing these off in the vigorous fashion with Anasept. 10/23/17; large wounds on her bilateral malleoli which are chronic. Some improvement in terms of size perhaps on the right since last time I saw these. She states that after we increased the 3 layer compression there was some bleeding, when she came in for a nurse visit she did not want 3 layer compression put back on about our nurse managed to convince her. She has known chronic venous visit issues and I'm hoping to get her to tolerate the 3 layer compression. using Hydrofera Blue 10/30/17; absolutely no change in the condition of either wound although we've had some improvement in dimensions on the right.. Attempted to put her in 3 layer compression she didn't tolerated she is back in 2 layer compression. We've been using Hydrofera Blue We looked over her past records. She had venous reflux studies in November 2016. There was no evidence of deep venous reflux on the right. Superficial vein did not show the greater saphenous vein at think this is been previously ablated the small saphenous vein was within normal limits. The left deep venous system showed no DVT the vessels were positive for deep venous reflux in the posterior tibial veins at the ankle. The greater saphenous vein was surgically absent small saphenous vein was within normal limits.  She went to vein and vascular at [facility]. I believe she had an ablation on the left greater saphenous vein. I'll update her reflux studies perhaps ever reviewed by vein and vascular. We've made absolutely no progress in these wounds. Will also try to read and TheraSkins through her insurance 11/06/17; W the patient apparently has a 2 week follow-up with vein and vascular I like him to review the whole issue with regards to her previous vascular workup by Dr. Aleda Grana. We've really made no progress on these wounds in many months. She arrives today with less viable looking surface on the left medial ankle wound. This was apparently looking about the same on Tuesday when she was here for nurse visit. 11/13/17; deep tissue culture I did last time of the left lower leg showed multiple organisms without any predominating. In particular no Staphylococcus or group A strep were isolated. We sent her for venous reflux studies. She's had  a previous left greater saphenous vein stripping and I think sclerotherapy of the right greater saphenous vein. She didn't really look at the lesser saphenous vein this both wounds are on the medial aspect. She has reflux in the common femoral vein and popliteal vein and an accessory vein on the right and the common femoral vein and popliteal vein on the left. I'm going to have her go to see vein and vascular just the look over things and see if anything else beside aggressive compression is indicated here. We have not been able to make any progress on these wounds in spite of the fact that the surface of the wounds is never look too bad. 11/20/17; no major change in the condition of the wounds. Patient reports a large amount of drainage. She has a lot of complaints of pain although enlisting her today I wonder if some of this at least his neuropathic rather than secondary to her wounds. She has an appointment with vein and vascular on 12/30/17. The refractory nature of  these wounds in my mind at least need vein and vascular to look over the wounds the recent reflux studies we did and her history to see if anything further can be done here. I also note her gait is deteriorated quite a bit. 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 She has a bilateral Trendelenburg gait. I wonder if this is neuropathic or perhaps multilevel radicular. 11/27/17; her wounds actually looks slightly better. Healthy-looking granulation tissue a scant amount of epithelialization. [place] [facility] will not pay for [employer]. They will play for tri layer Oasis and Dermagraft. This is not a diabetic ulcer. We'll try for the tri layer Oasis. She still complains of some drainage. She has a vein and vascular appointment on 12/30/17 12/04/17; the wounds visually look quite good. Healthy-looking granulation with some degree of epithelialization. We are still waiting for response to our request for trial to try layer Oasis. Her appointment with vascular to review venous and arterial issues isn't sold the end of July 7/31. Not allow debridement or even vigorous cleansing of the wound surface. 12/18/17; slightly smaller especially on the right. 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 We've been using Hydrofera Blue. We still are looking into triple layer Oasis through her insurance 01/08/18 on evaluation today patient's wound actually appears to be showing signs of good improvement at this point in time. She has been tolerating the dressing changes without complication. Fortunately there does not appear to be any evidence of infection at this point in time. We have been utilizing silver nitrate which does seem to be of benefit for her which is also good news. Overall I'm very happy with how things seem to be both regards appearance as well as measurement. Patient did see Dr. Bridgett Larsson for evaluation on 12/30/17. In his assessment he felt that stripping would not  likely add much more than chronic compression to the patient's healing process. His recommendation was to follow-up in three months with Dr. Doren Custard if she hasn't healed in order to consider referral back to you and see vascular where she previously was in a trial and was able to get her wound to heal. I'll be see what she feels she when you staying compression and he reiterated this as well. 01/13/18 on evaluation today patient appears to actually be doing very well in regard to her bilateral medial malleolus ulcers. She seems to have tolerated the chemical cauterization with silver nitrate last  week she did have some pain through that evening but fortunately states that I'll be see since it seems to be doing better she is overall pleased with the progress. 01/21/18; really quite a remarkable improvement since I've last seen these wounds. We started using silver nitrate specially on the islands of hyper granulation which for some reason her around the wound circumference. This is really done quite nicely. Primary dressing Hydrofera Blue under 4 layer compression. She seems to be able to hold out without a nurse rewrap. Follow-up in 1 week 01/28/18; we've continued the hydrofera blue but continued with chemical cauterization to the wound area that we started about a month ago for irregular hyper granulation. She is made almost stunning improvement in the overall wound dimensions. I was not really expecting this degree of improvement in these chronic wounds 02/05/18; we continue with Hydrofera Bluebut of also continued the aggressive chemical cauterization with silver nitrate. We made nice progress with the right greater than left wound. 02/12/18. We continued with Hydrofera Blue after aggressive chemical cauterization with silver nitrate. We appear to be making nice progress with both wound areas 02/19/2018; we continue with [facility] after washing the wounds vigorously with Anasept spray and  chemical cauterization with silver nitrate. We are making excellent progress. The area on the right's just about closed 02/26/2018. The area on the left medial ankle had too much necrotic debris today. I used a #5 curette we are able to get most of the soft. I continued with the silver nitrate to the much smaller wound on the right medial ankle she had a new area on her right lower pretibial area which she says was due to a role in her compression 03/05/2018; both wound areas look healthy. Not much change in dimensions from last week. I continue to use silver nitrate and Hydrofera Blue. The patient saw Dr. Doren Custard of vein and vascular. He felt she had venous stasis ulcers. He felt based on her previous arteriogram she should have adequate circulation for healing. Also she has deep venous reflux but really no significant correctable superficial venous reflux at this time. He felt we should continue with conservative management including leg elevation and compression 04/02/2018; since we last saw this woman about a month ago she had a fall apparently suffered a pelvic fracture. I did not look up the x-ray. Nevertheless because of pain she literally was bedbound for 2 weeks and had home health coming out to change the dressing. Somewhat predictably this is resulted in considerable improvement in both wound areas. The right is just about closed on the medial malleolus and the left is about half the size. 04/16/2018; both her wounds continue to go down in size. Using Hydrofera Blue. 05/07/18; both her wounds appeared to be improving especially on the right where it is almost closed. We are using Hydrofera Blue 05/14/2018; slightly worse this week with larger wounds. Surface on the left medial not quite as good. We have been using Hydrofera Blue 05/21/18; again the wounds are slightly larger. Left medial malleolus slightly larger with eschar around the circumference. We have been using Hydrofera Blue  undergoing a wraps for a prolonged period of time. This got a lot better when she was more recumbent due to a fall and a back injury. I change the primary dressing the silver alginate today. She did not tolerate a 4 layer compression previously although I may need to bring this up with her next time 05/28/2018; area on the left medial  malleolus again is slightly larger with more drainage. Area on the right is roughly unchanged. She has a small area of folliculitis on the right medial just on the lower calf. This does not look ominous. 06/03/2018 left medial malleolus slightly smaller in a better looking surface. We used silver nitrate on this last time with silver alginate. The area on the right appears slightly smaller 1/10; left medial malleolus slightly smaller. Small open area on the right. We used silver nitrate and silver alginate as of 2 weeks ago. We continue with the wound and compression. These got a lot better when she was off her feet 1/17; right medial malleolus wound is smaller. The left may be slightly smaller. Both surfaces look somewhat better. 1/24; both wounds are slightly smaller. Using silver alginate under Unna boots 1/31; both wounds appear smaller in fact the area on the right medial is just about closed. Surface eschar. We have been using silver alginate under Unna boots. The patient is less active now spends let much less time on her feet and I think this is contributed to the general improvement in the wound condition 2/7; both wounds appear smaller. I was hopeful the right medial would be closed however there there is still the same small open area. Slight amount of surface eschar on the left the dimensions are smaller there is eschar but the wound edges appear to be free. We have been using silver alginate under Unna boot's 2/14; both wounds once again measure smaller. Circumferential eschar on the left medial. We have been using silver alginate under Unna boots with  gradual improvement 2/21; the area on the right medial malleolus has healed. The area on the left is smaller. We have been using silver alginate and Unna boots. We can discharge wrapping the right leg she has 20/30 stockings at home she will need to protect the scar tissue in this area 2/28; the area on the right medial malleolus remains closed the patient has a compression stocking. The area on the left is smaller. We have been using silver alginate and Unna boots. 3/6 the area on the right medial ankle remains closed. Good edema control noted she is using her own compression stocking. The area on the left medial ankle is smaller. We have been managing this with silver alginate and Unna boots which we will continue today. 3/13; the area on the right medial ankle remains closed and I'm declaring it healed today. When necessary the left is about the same still a healthy-looking surface but no major change and wound area. No evidence of infection and using silver alginate under unna and generally making considerable improvement 3/27 the area on the right medial ankle remains closed the area on the left is about the same as last week. Certainly not any worse we have been using silver alginate under an Unna boot 4/3; the area on the right medial ankle remains closed per the patient. We did not look at this wound. The wound on the left medial ankle is about the same surface looks healthy we have been using silver alginate under an Unna boot 4/10; area on the right medial ankle remains closed per the patient. We did not look at this wound. The wound on the left medial ankle is slightly larger. The patient complains that the [facility] caused burning pain all week. She also told us that she was a lot more active this week. Changed her back to silver alginate 4/17; right medial ankle still closed  per the patient. Left medial ankle is slightly larger. Using silver alginate. She did not tolerate  Hydrofera Blue on this area 4/24; right medial ankle remains closed we have not look at this. The left medial ankle continues to get larger today by about a centimeter. We have been using silver alginate under Unna boots. She complains about 4 layer compression as an alternative. She has been up on her feet working on her garden 5/8; right medial ankle remains closed we did not look at this. The left medial ankle has increased in size about 100%. We have been using silver alginate under Unna boots. She noted increased pain this week and was not surprised that the wound is deteriorated 5/15; no major change in SA however much less erythema ( one week of doxy ocellulitis). 5/22-64 year old female returns at 1 week to the clinic for left medial ankle wound for which we have been using silver alginate under 3 layer compression She was placed on DOXY at last visit - the wound is wider at this visit. She is in 3 layer compression 5/29; change to [facility] last week. I had given her empiric doxycycline 2 weeks ago for a week. She is in 3 layer compression. She complains of a lot of pain and drainage on presentation today. 6/5; using Hydrofera Blue. I gave her doxycycline recently empirically for erythema and pain around the wound. Believe her cultures showed enterococcus which not would not have been well covered by doxycycline nevertheless the wound looks better and I don't feel specifically that the enterococcus needs to be covered. She has a new what looks like a wrap injury on her lateral left ankle. 6/12; she is using Hydrofera Blue. She has a new area on the left anterior lower tibial area. This was a wrap injury last week. 6/19; the patient is using Hydrofera Blue. She arrived with marked inflammation and erythema around the wound and tenderness. 12/01/18 on evaluation today patient appears to be doing a little bit better based on what I'm hearing from the standpoint of lassos evaluation to  this as far as the overall appearance of the wound is concerned. Then sometime substandard she typically sees Dr. Dellia Nims. Nonetheless overall very pleased with the progress that she's made up to this point. No fevers, chills, nausea, or vomiting noted at this time. 7/10; some improvement in the surface area. Aggressively debrided last week apparently. I went ahead with the debridement today although the patient does not tolerate this very well. We have been using Iodoflex. Still a fair amount of drainage 7/17; slightly smaller. Using Iodoflex. 7/24; no change from last week in terms of surface area. We have been using Iodoflex. Surface looks and continues to look somewhat better 7/31; surface area slightly smaller better looking surface. We have been using Iodoflex. This is under Unna boot compression 8/7-Patient presents at 1 week with Unna boot and Iodoflex, wound appears better 8/14-Patient presents at 1 week with Iodoflex, we use the Unna boot, wound appears to be stable better.Patient is getting Botox treatment for the inversion of the foot for tendon release, Next week 8/21; we are using Iodoflex. Unna boot. The wound is stable in terms of surface area. Under illumination there is some areas of the wound that appear to be either epithelialized or perhaps this is adherent slough at this point I was not really clear. It did not wipe off and I was reluctant to debride this today. 8/28; we are using Iodoflex in an [place]  boot. Seems to be making good improvement. 9/4; using Iodoflex and wound is slightly smaller. 9/18; we are using Iodoflex with topical silver nitrate when she is here. The wound continues to be smaller 10/2; patient missed her appointment last week due to GI issues. She left and Iodoflex based dressing on for 2 weeks. Wound is about the same size about the size of a dime on the left medial lower 10/9 we have been using Iodoflex on the medial left ankle wound. She has a new  superficial probable wrap injury on the dorsal left ankle 10/16; we have been using Hydrofera Blue since last week. This is on the left medial ankle 10/23; we have been using Hydrofera Blue since 2 weeks ago. This is on the left medial ankle. Dimensions are better 11/6; using Hydrofera Blue. I think the wound is smaller but still not closed. Left medial ankle 11/13; we have been using Hydrofera Blue. Wound is certainly no smaller this week. Also the surface not as good. This is the remanent of a very large area on her left medial ankle. 11/20; using Sorbact since last week. Wound was about the same in terms of size although I was disappointed about the surface debris 12/11; 3-week follow-up. Patient was on vacation. Wound is measuring slightly larger we have been using Sorbact. 12/18; wound is about the same size however surface looks better last week after debridement. We have been using Sorbact under compression 1/15 wound is probably twice the size of last time increased in length nonviable surface. We have been using Sorbact. She was running a mild fever and missed her appointment last week 1/22; the wound is come down in size but under illumination still a very adherent debris we have been Hydrofera Blue that I changed her to last week Objective Constitutional Vitals Time Taken: 10:19 AM, Height: 68 in, Source: Stated, Weight: 127 lbs, Source: Stated, BMI: 19.3, Temperature: 97.7 F, Pulse: 57 bpm, Respiratory Rate: 18 breaths/min, Blood Pressure: 110/49 mmHg. Integumentary (Hair, Skin) Wound #3 status is Open. Original cause of wound was Gradually Appeared. The wound is located on the Left,Medial Malleolus. The wound measures 2.4cm length x 1.3cm width x 0.1cm depth; 2.45cm^2 area and 0.245cm^3 volume. There is Fat Layer (Subcutaneous Tissue) Exposed exposed. There is no tunneling or undermining noted. There is a medium amount of serosanguineous drainage noted. The wound margin is  distinct with the outline attached to the wound base. There is medium (34-66%) red, pink granulation within the wound bed. There is a medium (34-66%) amount of necrotic tissue within the wound bed including Adherent Slough. 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 There was a Excisional Skin/Subcutaneous Tissue Debridement with a total area of 3.12 sq cm performed by [name]., MD. With the following instrument(s): Curette to remove Viable and Non-Viable tissue/material. Material removed includes Subcutaneous Tissue after achieving pain control using Other (benzocaine, 20%). No specimens were taken. A time out was conducted at 10:55, prior to the start of the procedure. A Minimum amount of bleeding was controlled with Pressure. The procedure was tolerated well with a pain level of 2 throughout and a pain level of 0 following the procedure. Post Debridement Measurements: 2.4cm length x 1.3cm  width x 0.1cm depth; 0.245cm^3 volume. Character of Wound/Ulcer Post Debridement is improved. Severity of Tissue Post Debridement is: Fat layer exposed. Post procedure Diagnosis Wound #3: Same as Pre-Procedure Pre-procedure diagnosis of Wound #3 is a Venous Leg Ulcer located on the Left,Medial Malleolus . There was a Haematologist Compression Therapy Procedure by Kela Millin, RN. Post procedure Diagnosis Wound #3: Same as Pre-Procedure Plan Follow-up Appointments: Return Appointment in 1 week. - Friday Dressing Change Frequency: Wound #3 Left,Medial Malleolus: Do not change entire dressing for one week. - change dressing in one week Skin Barriers/Peri-Wound Care: Moisturizing  lotion TCA Cream or Ointment - mixed with lotion Wound Cleansing: Clean wound with Wound Cleanser - clean with anasept with dressing changes May shower with protection. Primary Wound Dressing: Wound #3 Left,Medial Malleolus: Hydrofera Blue - classic Secondary Dressing: Wound #3 Left,Medial Malleolus: Dry Gauze Edema Control: Unna Boot to Left Lower Extremity - no kerlix layer Avoid standing for long periods of time Elevate legs to the level of the heart or above for 30 minutes daily and/or when sitting, a frequency of: - throughout the day Support Garment 20-30 mm/Hg pressure to: - compression stocking right leg daily 1. I am continuing with Hydrofera Blue under Unna boots 2. I do not have a good alternative right now for the debris that she develops over the wound which is fibrinous and very adherent. If I am going to keep her under compression which she needs that limits the alternative Electronic Signature(s) Signed: 06/24/2019 5:37:40 PM By: Linton Ham MD Entered By: Linton Ham on 06/24/2019 11:23:50 -------------------------------------------------------------------------------- SuperBill Details Patient Name: Date of Service: [name] 06/24/2019 Medical Record [id number] Patient Account Number: [id number] Date of Birth/Sex: Treating RN: [birth year] (64 y.o. 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

## 2019-07-01 ENCOUNTER — Other Ambulatory Visit: Payer: Self-pay

## 2019-07-01 ENCOUNTER — Encounter (HOSPITAL_BASED_OUTPATIENT_CLINIC_OR_DEPARTMENT_OTHER): Payer: Medicare Other | Attending: Internal Medicine | Admitting: Internal Medicine

## 2019-07-01 DIAGNOSIS — L97322 Non-pressure chronic ulcer of left ankle with fat layer exposed: Secondary | ICD-10-CM | POA: Diagnosis not present

## 2019-07-01 NOTE — Progress Notes (Addendum)
[name], [name] ([id number]) Visit Report for 07/01/2019 Arrival Information Details Patient Name: Date of Service: [name], [name] 07/01/2019 10:00 AM Medical Record [id number] Patient Account Number: [id number] Date of Birth/Sex: Treating RN: 11-13-1955 (64 y.o. [name] Anarie Kalish: [name] Other Clinician: Referring Elois Averitt: Treating Chukwuebuka Churchill/Extender:Robson, Tawny Asal in Treatment: 342 Visit Information History Since Last Visit Cane Added or deleted any medications: No Patient Arrived: 10:12 Any new allergies or adverse reactions: No Arrival Time: Had a fall or experienced change in No Accompanied By: self None activities of daily living that may affect Transfer Assistance: risk of falls: Patient Identification Verified: Yes Signs or symptoms of abuse/neglect since last No Secondary Verification Process Completed: Yes visito Patient Requires Transmission-Based No Hospitalized since last visit: No Precautions: Implantable device outside of the clinic excluding No Patient Has Alerts: No cellular tissue based products placed in the center since last visit: Has Dressing in Place as Prescribed: Yes Has Compression in Place as Prescribed: Yes Pain Present Now: Yes Electronic Signature(s) Signed: 07/01/2019 5:55:15 PM By: Baruch Gouty RN, BSN Entered By: Baruch Gouty on 07/01/2019 10:12:25 -------------------------------------------------------------------------------- Compression Therapy Details Patient Name: Date of Service: [name] 07/01/2019 10:00 AM Medical Record [id number] Patient Account Number: [id number] Date of Birth/Sex: Treating RN: [birth year] (64 y.o. [name] Primary Care Alainah Phang: [name] Other Clinician: Referring Matisse Roskelley: Treating Amaria Mundorf/Extender:Robson, Tawny Asal in Treatment: 342 Compression Therapy Performed for Wound Wound #3  Left,Medial Malleolus Assessment: Performed By: Clinician Deon Pilling, RN Compression Type: Rolena Infante Post Procedure Diagnosis Same as Pre-procedure Electronic Signature(s) Signed: 07/01/2019 5:49:40 PM By: Kela Millin Entered By: Kela Millin on 07/01/2019 10:57:46 -------------------------------------------------------------------------------- Encounter Discharge Information Details Patient Name: Date of Service: [name] 07/01/2019 10:00 AM Medical Record [id number] Patient Account Number: [id number] Date of Birth/Sex: Treating RN: 1956/05/04 (64 y.o. [name] Primary Care Jauan Wohl: [name] Other Clinician: Referring Wells Gerdeman: Treating Jonanthan Bolender/Extender:Robson, Tawny Asal in Treatment: 342 Encounter Discharge Information Items Discharge Condition: Stable Ambulatory Status: Cane Discharge Destination: Home Transportation: Private Auto Accompanied By: self Schedule Follow-up Appointment: Yes Clinical Summary of Care: Electronic Signature(s) Signed: 07/01/2019 6:00:37 PM By: Deon Pilling Entered By: Deon Pilling on 07/01/2019 11:03:33 -------------------------------------------------------------------------------- Lower Extremity Assessment Details Patient Name: Date of Service: [name], [name] 07/01/2019 10:00 AM Medical Record [id number] Patient Account Number: [id number] Date of Birth/Sex: Treating RN: [birth year] (64 y.o. [name] Kindred Reidinger: [name] Other Clinician: Referring Tom Ragsdale: Treating Kamarius Buckbee/Extender:Robson, Velva Harman, Essie Christine in Treatment: 342 Edema Assessment Assessed: [Left: No] [Right: No] Edema: [Left: N] [Right: o] Calf Left: Right: Point of Measurement: 33 cm From Medial Instep 30 cm cm Ankle Left: Right: Point of Measurement: 12 cm From Medial Instep 20.8 cm cm Vascular Assessment Pulses: Dorsalis Pedis Palpable: [Left:Yes] Electronic  Signature(s) Signed: 07/01/2019 5:55:15 PM By: Baruch Gouty RN, BSN Entered By: Baruch Gouty on 07/01/2019 10:16:41 -------------------------------------------------------------------------------- Multi Wound Chart Details Patient Name: Date of Service: [name] 07/01/2019 10:00 AM Medical Record [id number] Patient Account Number: [id number] Date of Birth/Sex: Treating RN: 07-24-1955 (64 y.o. F) Primary Care Erian Lariviere: [name] Other Clinician: Referring Yoni Lobos: Treating Maurya Nethery/Extender:Robson, Velva Harman, Essie Christine in Treatment: 342 Vital Signs Height(in): 68 Pulse(bpm): 61 Weight(lbs): 127 Blood Pressure(mmHg): 114/48 Body Mass Index(BMI): 19 Temperature(F): 98.1 Respiratory 18 Rate(breaths/min): Photos: [3:No Photos] [N/A:N/A] Wound Location: [3:Left, Medial Malleolus] [N/A:N/A] Wounding Event: [3:Gradually Appeared] [N/A:N/A] Primary Etiology: [3:Venous Leg Ulcer] [N/A:N/A] Comorbid History: [3:Congestive Heart Failure, Peripheral Vascular Disease,  Congestive Heart Failure, End Stage Renal Disease, Tobacco Use, Chronic Obstructive Pulmonary Disease (COPD), Type 1 Diabetes] [N/A:N/A] Date Acquired: [3:11/15/2012] [N/A:N/A] Weeks of Treatment: [3:342] [N/A:N/A] Wound Status: [3:Open] [N/A:N/A] Measurements L x W x D 2.2x1.1x0.1 [N/A:N/A] (cm) Area (cm) : [3:1.901] [N/A:N/A] Volume (cm) : [3:0.19] [N/A:N/A] % Reduction in Area: [3:39.50%] [N/A:N/A] % Reduction in Volume: [3:69.70%] [N/A:N/A] Classification: [3:Full Thickness Without Exposed Support Structures] [N/A:N/A] Exudate Amount: [3:Medium] [N/A:N/A] Exudate Type: [3:Serosanguineous] [N/A:N/A] Exudate Color: [3:red, brown] [N/A:N/A] Wound Margin: [3:Distinct, outline attached N/A] Granulation Amount: [3:Large (67-100%)] [N/A:N/A] Granulation Quality: [3:Red, Pink] [N/A:N/A] Necrotic Amount: [3:Small (1-33%)] [N/A:N/A] Exposed Structures: [3:Fat Layer (Subcutaneous N/A Tissue)  Exposed: Yes Fascia: No Tendon: No Muscle: No Joint: No Bone: No] Epithelialization: [3:Small (1-33%) Compression Therapy] [N/A:N/A N/A] Treatment Notes Wound #3 (Left, Medial Malleolus) 1. Cleanse With Wound Cleanser Soap and water 2. Periwound Care Barrier cream TCA Ointment 3. Primary Dressing Applied Hydrofera Blue 4. Secondary Dressing Dry Gauze 6. Support Layer Production assistant, radio Notes no kerlix in [employer]. hydrofera classic. foam to lateral side of foot for protection. Electronic Signature(s) Signed: 07/01/2019 5:55:42 PM By: Linton Ham MD Entered By: Linton Ham on 07/01/2019 11:21:25 -------------------------------------------------------------------------------- Multi-Disciplinary Care Plan Details Patient Name: Date of Service: [name] 07/01/2019 10:00 AM Medical Record [id number] Patient Account Number: [id number] Date of Birth/Sex: Treating RN: 31-May-1956 (64 y.o. [name] Primary Care Azir Muzyka: [name] Other Clinician: Referring Tearsa Kowalewski: Treating Eveleigh Crumpler/Extender:Robson, Velva Harman, Essie Christine in Treatment: 342 Active Inactive Venous Leg Ulcer Nursing Diagnoses: Actual venous Insuffiency (use after diagnosis is confirmed) Goals: Patient will maintain optimal edema control Date Initiated: 09/04/2017 Target Resolution Date: 07/15/2019 Goal Status: Active Patient/caregiver will verbalize understanding of disease process and disease management Date Initiated: 08/03/2014 Date Inactivated: 08/28/2017 Target Resolution Date: 08/29/2017 Goal Status: Met Interventions: Compression as ordered Treatment Activities: Therapeutic compression applied : 09/21/2015 Notes: Wound/Skin Impairment Nursing Diagnoses: Impaired tissue integrity Knowledge deficit related to ulceration/compromised skin integrity Goals: Patient will have a decrease in wound volume by X% from date: (specify in notes) Date Initiated: 04/05/2015 Date  Inactivated: 02/12/2017 Target Resolution Date: 09/01/2016 Unmet Reason: chronic, Goal Status: Unmet unable to tolerate stronger compression Patient/caregiver will verbalize understanding of skin care regimen Date Initiated: 04/05/2015 Target Resolution Date: 07/15/2019 Goal Status: Active Interventions: Assess patient/caregiver ability to obtain necessary supplies Assess patient/caregiver ability to perform ulcer/skin care regimen upon admission and as needed Assess ulceration(s) every visit Provide education on ulcer and skin care Notes: Electronic Signature(s) Signed: 07/01/2019 5:49:40 PM By: Kela Millin Entered By: Kela Millin on 07/01/2019 10:18:51 -------------------------------------------------------------------------------- Pain Assessment Details Patient Name: Date of Service: IRVIN, LIZAMA 07/01/2019 10:00 AM Medical Record [id number] Patient Account Number: [id number] Date of Birth/Sex: Treating RN: [birth year] (64 y.o. [name] Eleora Sutherland: [name] Other Clinician: Referring Whitnee Orzel: Treating Mosiah Bastin/Extender:Robson, Tawny Asal in Treatment: 342 Active Problems Location of Pain Severity and Description of Pain Patient Has Paino Yes Site Locations Pain Location: Pain in Ulcers With Dressing Change: Yes Duration of the Pain. Constant / Intermittento Constant Rate the pain. Current Pain Level: 6 Worst Pain Level: 7 Character of Pain Describe the Pain: Aching, Burning Pain Management and Medication Current Pain Management: Medication: Yes Is the Current Pain Management Adequate: Adequate Rest: Yes How does your wound impact your activities of daily livingo Sleep: No Bathing: No Appetite: No Relationship With Others: No Bladder Continence: No Emotions: No Bowel Continence: No Work: No Toileting: No Drive: No Dressing: No Hobbies: No  Electronic Signature(s) Signed: 07/01/2019 5:55:15 PM  By: Baruch Gouty RN, BSN Entered By: Baruch Gouty on 07/01/2019 10:13:35 -------------------------------------------------------------------------------- Patient/Caregiver Education Details Patient Name: Date of Service: Liggett, Cristel G. 1/29/2021andnbsp10:00 AM Medical Record [id number] Patient Account Number: [id number] Date of Birth/Gender: Treating RN: July 10, 1955 (63 y.o. [name] Primary Care Physician: [name] Other Clinician: Referring Physician: Treating Physician/Extender:Robson, Tawny Asal in Treatment: [phone] Education Assessment Education Provided To: Patient Education Topics Provided Wound/Skin Impairment: Methods: Explain/Verbal Responses: State content correctly Electronic Signature(s) Signed: 07/01/2019 5:49:40 PM By: Kela Millin Entered By: Kela Millin on 07/01/2019 10:19:07 -------------------------------------------------------------------------------- Wound Assessment Details Patient Name: Date of Service: EVALINA, TABAK 07/01/2019 10:00 AM Medical Record [id number] Patient Account Number: [id number] Date of Birth/Sex: Treating RN: 07-Oct-1955 (64 y.o. F) Primary Care Morrison Mcbryar: [name] Other Clinician: Referring Derell Bruun: Treating Thaer Miyoshi/Extender:Robson, Velva Harman, Essie Christine in Treatment: 342 Wound Status Wound Number: 3 Primary Venous Leg Ulcer Etiology: Wound Location: Left, Medial Malleolus Wound Open Wounding Event: Gradually Appeared Status: Date Acquired: 11/15/2012 Comorbid Congestive Heart Failure, Peripheral Vascular Weeks Of Treatment: 342 History: Disease, Congestive Heart Failure, End Stage Clustered Wound: No Renal Disease, Tobacco Use, Chronic Obstructive Pulmonary Disease (COPD), Type 1 Diabetes Photos Wound Measurements Length: (cm) 2.2 % Width: (cm) 1.1 % Depth: (cm) 0.1 Ep Area: (cm) 1.901 T Volume: (cm) 0.19 U Wound  Description Classification: Full Thickness Without Exposed Support Structures Wound Distinct, outline attached Margin: Exudate Medium Amount: Exudate Serosanguineous Type: Exudate red, brown Color: Wound Bed Granulation Amount: Large (67-100%) Granulation Quality: Red, Pink Necrotic Amount: Small (1-33%) Necrotic Quality: Adherent Slough Foul Odor After Cleansing: No Slough/Fibrino Yes Exposed Structure Fascia Exposed: No Fat Layer (Subcutaneous Tissue) Exposed: Yes Tendon Exposed: No Muscle Exposed: No Joint Exposed: No Bone Exposed: No Reduction in Area: 39.5% Reduction in Volume: 69.7% ithelialization: Small (1-33%) unneling: No ndermining: No Treatment Notes Wound #3 (Left, Medial Malleolus) 1. Cleanse With Wound Cleanser Soap and water 2. Periwound Care Barrier cream TCA Ointment 3. Primary Dressing Applied Hydrofera Blue 4. Secondary Dressing Dry Gauze 6. Support Layer Production assistant, radio Notes no kerlix in [employer]. hydrofera classic. foam to lateral side of foot for protection. Electronic Signature(s) Signed: 07/06/2019 4:30:25 PM By: Mikeal Hawthorne EMT/HBOT Previous Signature: 07/01/2019 5:55:15 PM Version By: Baruch Gouty RN, BSN Previous Signature: 07/01/2019 5:55:15 PM Version By: Baruch Gouty RN, BSN Entered By: Mikeal Hawthorne on 07/06/2019 14:47:00 -------------------------------------------------------------------------------- Vitals Details Patient Name: Date of Service: [name] 07/01/2019 10:00 AM Medical Record [id number] Patient Account Number: [id number] Date of Birth/Sex: Treating RN: 01-24-1956 (64 y.o. [name] Raelie Lohr: [name] Other Clinician: Referring Josiane Labine: Treating [place] Spees/Extender:Robson, Tawny Asal in Treatment: 342 Vital Signs Time Taken: 10:12 Temperature (F): 98.1 Height (in): 68 Pulse (bpm): 61 Source: Stated Respiratory Rate (breaths/min):  18 Weight (lbs): 127 Blood Pressure (mmHg): 114/48 Source: Stated Reference Range: 80 - 120 mg / dl Body Mass Index (BMI): 19.3 Electronic Signature(s) Signed: 07/01/2019 5:55:15 PM By: Baruch Gouty RN, BSN Entered By: Baruch Gouty on 07/01/2019 10:13:16

## 2019-07-01 NOTE — Progress Notes (Signed)
GENSIS, TRAKAS ([id number]) Visit Report for 07/01/2019 HPI Details Patient Name: Date of Service: [name], [name] 07/01/2019 10:00 AM Medical Record [id number] Patient Account Number: [id number] Date of Birth/Sex: Treating RN: 10-19-1955 (64 y.o. F) Primary Care Provider: [name] Other Clinician: Referring Provider: Treating Provider/Extender:Kashaun Bebo, Tawny Asal in Treatment: 342 History of Present Illness HPI Description: the remaining wound is over the left medial ankle. Similar wound over the right medial ankle healed largely with use of Apligraf. Most recently we have been using Hydrofera Blue over this wound with considerable improvement. The patient has been extensively worked up in the past for her venous insufficiency and she is not a candidate for antireflux surgery although I have none of the details available currently. 08/24/14; considerable improvement today. About 50% of this wound areas now epithelialized. The base of the wound appears to be healthier granulation.as opposed to last week when she had deteriorated a considerable improvement 08/17/14; unfortunately the wound has regressed somewhat. The areas of epithelialization from the superior aspect are not nearly as healthy as they were last week. The patient thinks her Hydrofera Blue slipped. 09/07/14; unfortunately the area has markedly regressed in the 2 weeks since I've seen this. There is an odor surrounding erythema. The healthy granulation tissue that we had at the base of the wound now is a dusky color. The nurse reports green drainage 09/14/14; the area looks somewhat better than last week. There is less erythema and less drainage. The culture I did did not show any growth. Nevertheless I think it is better to continue the Cipro and doxycycline for a further week. The remaining wound area was debridement. 09/21/14. Wound did not require debridement last week. Still less erythema and  less drainage. She can complete her antibiotics. The areas of epithelialization in the superior aspect of the wound do not look as healthy as they did some weeks ago 10/05/14 continued improvement in the condition of this wound. There is advancing epithelialization. Less aggressive debridement required 10/19/14 continued improvement in the condition and volume of this wound. Less aggressive debridement to the inferior part of this to remove surface slough and fibrinous eschar 11/02/14 no debridement is required. The surface granulation appears healthy although some of her islands of epithelialization seem to have regressed. No evidence of infection 11/16/14; lites surface debridement done of surface eschar. The wound does not look to be unhealthy. No evidence of infection. Unfortunately the patient has had podiatry issues in the right foot and for some reason has redeveloped small surface ulcerations in the medial right ankle. Her original presentation involved wounds in this area 11/23/14 no debridement. The area on the right ankle has enlarged. The left ankle wound appears stable in terms of the surface although there is periwound inflammation. There has been regression in the amount of new skin 11/30/14 no debridement. Both wound areas appear healthy. There was no evidence of infection. The the new area on the right medial ankle has enlarged although that both the surfaces appear to be stable. 12/07/14; Debridement of the right medial ankle wound. No no debridement was done on the left. 12/14/14 no major change in and now bilateral medial ankle wounds. Both of these are very painful but the no overt evidence of infection. She has had previous venous ablation 12/21/14; patient states that her right medial ankle wound is considerably more painful last week than usual. Her left is also somewhat painful. She could not tolerate debridement. The right medial ankle wound has  fibrinous surface eschar 12/28/14  this is a patient with severe bilateral venous insufficiency ulcers. For a considerable period of time we actually had the one on the right medial ankle healed however this recently opened up again in June. The left medial ankle wound has been a refractory area with some absent flows. We had some success with Hydrofera Blue on this area and it literally closed by 50% however it is recently opened up Foley. Both of these were debridement today of surface eschar. She tolerates this poorly 01/25/15: No change in the status of this. Thick adherent escar. Very poor tolerance of any attempt at debridement. I had healed the right medial malleolus wound for a considerable amount of time and had the left one down to about 50% of the volume although this is totally regressed over the last 48 weeks. Further the right leg has reopened. she is trying to make a appointment with pain and vascular, previous ablations with Dr. Aleda Grana. I do not believe there is an arterial insufficiency issue here 02/01/15 the status of the adherent eschar bilaterally is actually improved. No debridement was done. She did not manage to get vascular studies done 02/08/15 continued debridement of the area was done today. The slough is less adherent and comes off with less pressure. There is no surrounding infection peripheral pulses are intact 02/15/15 selective debridement with a disposable curette. Again the slough is less adherent and comes off with less difficulty. No surrounding infection peripheral pulses are intact. 02/22/15 selective debridement of the right medial ankle wound. Slough comes off with less difficulty. No obvious surrounding infection peripheral pulses are intact I did not debridement the one on the left. Both of these are stable to improved 03/01/15 selective debridement of both wound areas using a curette to. Adherent slough cup soft with less difficulty. No obvious surrounding infection. The patient tells me  that 2 days ago she noted a rash above the right leg wrap. She did not have this on her lower legs when she change this over she arrives with widespread left greater than right almost folliculitis-looking rash which is extremely pruritic. I don't see anything to culture here. There is no rash on the rest of her body. She feels well systemically. 03/08/15; selective debridement of both wounds using a curette. Base of this does not look unhealthy. She had limegreen drainage coming out of the left leg wound and describes a lot of drainage. The rash on her left leg looks improved to. No cultures were done. 03/22/15; patient was not here last week. Basal wounds does not look healthy and there is no surrounding erythema. No drainage. There is still a rash on the left leg that almost looks vasculitic however it is clearly limited to the top of where the wrap would be. 04/05/15; on the right required a surgical debridement of surface eschar and necrotic subcutaneous tissue. I did not debridement the area on the left. These continue to be large open wounds that are not changing that much. We were successful at one point in healing the area on the right, and at the same time the area on the left was roughly half the size of current measurements. I think a lot of the deterioration has to do with the prolonged time the patient is on her feet at work 04/19/15 I attempted-like surface debridement bilaterally she does not tolerate this. She tells me that she was in allergic care yesterday with extreme pain over her left lateral malleolus/ankle and  was told that she has an "sprain" 05/03/15; large bilateral venous insufficiency wounds over the medial malleolus/medial aspect of her ankles. She complains of copious amounts of drainage and his usual large amounts of pain. There is some increasing erythema around the wound on the right extending into the medial aspect of her foot to. historically she came in with  these wounds the right one healed and the left one came down to roughly half its current size however the right one is reopened and the left is expanded. 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 05/10/15 large bilateral venous insufficiency wounds. There is less adherence surface left however the surface culture that I did last week grew pseudomonas therefore bilateral selective debridement score necessary. There is surrounding erythema. The patient describes severe bilateral drainage and a lot of pain in the left ankle. Apparently her podiatrist was were ready to do a cortisone shot 05/17/15; the patient complains of pain and again copious amounts of drainage. 05/24/15; we used Iodo flex last week. Patient notes considerable improvement in wound drainage. Only needed to change this once. 05/31/15; we continued Iodoflex; the base of these large wounds bilaterally is not too bad but there is probably likely a significant bioburden here. I would like to debridement just doesn't tolerate it. 06/06/14 I would like to continue the Iodoflex although she still hasn't managed to obtain supplies. She has bilateral medial malleoli or large wounds which are mostly superficial. Both of them are covered circumferentially with some nonviable fibrinous slough although she tolerates debridement very poorly. She apparently has an appointment for an ablation on the right leg by interventional radiology. 06/14/15; the patient arrives with the wounds and static condition. We attempted a debridement although she does not do well with this secondary to pain. I 07/05/15; wounds are not much smaller however there appears to be a cleaner granulating base. The left has tight fibrinous slough greater than the right. Debridement is tolerated poorly due to pain. Iodoflex is done more for these wounds in any of the multitude of different dressings I have tried on the left 1 and  then subsequently the right. 07/12/15; no change in the condition of this wound. I am able to do an aggressive debridement on the right but not the left. She simply cannot tolerate it. We have been using Iodoflex which helps somewhat. It is worthwhile remembering that at one point we healed the right medial ankle wound and the left was about 25% of the current circumference. We have suggested returning to vascular surgery for review of possible further ablations for one reason or another she has not been able to do this. 07/26/15 no major change in the condition of either wound on her medial ankle. I did not attempt to debridement of these. She has been aggressively scrubbing these while she is in the shower at home. She has her supply of Iodoflex which seems to have done more for these wounds then anything I have put on recently. 08/09/15 wound area appears larger although not verified by measurements. Using Iodoflex 09/05/2015 -- she was here for avisit today but had significant problems with the wound and I was asked to see her for a physician opinion. I have summarize that this lady has had surgery on her left lower extremity about 10 years ago where the possible veins stripping was done. She has had an opinion from interventional radiology around November  2016 where no further sclerotherapy was ordered. The patient works 12 hours a day and stands on a concrete floor with work boots and is unable to get the proper compression she requires and cannot elevate her limbs appropriately at any given time. She has recently grown Pseudomonas from her wound culture but has not started her ciprofloxacin which was called in for her. 09/13/15 this continues to be a difficult situation for this patient. At one point I had this wound down to a 1.5 x 1.5" wound on her left leg. This is deteriorated and the right leg has reopened. She now has substantial wounds on her medial calcaneus, malleoli and into her lower  leg. One on the left has surface eschar but these are far too painful for me to debridement here. She has a vascular surgery appointment next week to see if anything can be done to help here. I think she has had previous ablations several years ago at [facility]. She has no major edema. She tells me that she did not get product last time [facility] and went for several days without it. She continues to work in work boots 12 hours a day. She cannot get compression/4-layer under her work boots. 09/20/15 no major change. Periwound edema control was not very good. Her point with pain and vascular is next Wednesday the 25th 09/28/15; the patient is seen vascular surgery and is apparently scheduled for repeat duplex ultrasounds of her bilateral lower legs next week. 10/05/15; the patient was seen by Dr. Doren Custard of vascular surgery. He feels that she should have arterial insufficiency excluded as cause/contributed to her nonhealing stage she is therefore booked for an arteriogram. She has apparently monophasic signals in the dorsalis pedis pulses. She also of course has known severe chronic venous insufficiency with previous procedures as noted previously. I had another long discussion with the patient today about her continuing to work 12 hour shifts. I've written her out for 2 months area had concerns about this as her work location is currently undergoing significant turmoil and this may lead to her termination. She is aware of this however I agree with her that she simply cannot continue to stand for 12 hours multiple days a week with the substantial wound areas she has. 10/19/15; the Dr. Doren Custard appointment was largely for an arteriogram which was normal. She does not have an arterial issue. He didn't make a comment about her chronic venous insufficiency for which she has had previous ablations. Presumably it was not felt that anything additional could be done. The patient is now out of work as I  prescribed 2 weeks ago. Her wounds look somewhat less aggravated presumably because of this. I felt I would give debridement another try today 10/25/15; no major change in this patient's wounds. We are struggling to get her product that she can afford into her own home through her insurance. 11/01/15; no major change in the patient's wounds. I have been using silver alginate as the most affordable product. I spoke to Dr. Marla Roe last week with her requested take her to the OR for surgical debridement and placement of ACEL. Dr. Marla Roe told me that she would be willing to do this however [facility] will not cover this, fortunately the patient has [place] healthcare of some variant 11/08/15; no major change in the patient's wounds. She has been completely nonviable surface that this but is in too much pain with any attempted debridement are clinic. I have arranged for  her to see Dr. Marla Roe ham of plastic surgery and this appointment is on Monday. I am hopeful that they will take her to the OR for debridement, possible ACEL ultimately possible skin graft 11/22/15 no major change in the patient's wounds over her bilateral medial calcaneus medial malleolus into the lower legs. Surface on these does not look too bad however on the left there is surrounding erythema and tenderness. This may be cellulitis or could him sleepy tinea. 11/29/15; no major changes in the patient's wounds over her bilateral medial malleolus. There is no infection here and I don't think any additional antibiotics are necessary. There is now plan to move forward. She sees Dr. Marla Roe in a week's time for preparation for operative debridement and ACEL placement I believe on 7/12. She then has a follow-up appointment with Dr. Marla Roe on 7/21 12/28/15; the patient returns today having been taken to the [facility] by Dr. Marla Roe 12/12/15 she underwent debridement, intraoperative cultures [which were negative]. She had  placement of a wound VAC. Parent really ACEL was not available to be placed. The wound VAC foam apparently adhered to the wound since then she's been using silver alginate, Xeroform under Ace wraps. She still says there is a lot of drainage and a lot of pain 01/31/16; this is a patient I see monthly. I had referred her to Dr. Marla Roe him of plastic surgery for large wounds on her bilateral medial ankles. She has been to the OR twice once in early July and once in early August. She tells me over the last 3 weeks she has been using the wound VAC with ACEL underneath it. On the right we've simply been using silver alginate. Under Kerlix Coban wraps. 02/28/16; this is a patient I'm currently seeing monthly. She is gone on to have a skin graft over her large venous insufficiency ulcer on the left medial ankle. This was done by Dr. Marla Roe him. The patient is a bit perturbed about why she didn't have one on her right medial ankle wound. She has been using silver alginate to this. 03/06/16; I received a phone call from her plastic surgery Dr. Marla Roe. She expressed some concern about the viability of the skin graft she did on the left medial ankle wound. Asked me to place Endoform on this. She told me she is not planning to do a subsequent skin graft on the right as the left one did not take very well. I had placed Hydrofera Blue on the right 03/13/16; continue to have a reasonably healthy wound on the right medial ankle. Down to 3 mm in terms of size. There is epithelialization here. The area on the left medial ankle is her skin graft site. I suppose the last week this looks somewhat better. She has an open area inferiorly however in the center there appears to be some viable tissue. There is a lot of surface callus and eschar that will eventually need to come off however none of this looked to be infected. Patient states that the is able to keep the dressing on for several days which is an  improvement. 03/20/16 no major change in the circumference of either wound however on the left side the patient was at Dr. Eusebio Friendly office and they did a debridement of left wound. 50% of the wound seems to be epithelialized. I been using Endoform on the left Hydrofera Blue in the right 03/27/16; she arrives today with her wound is not looking as healthy as they did last week.  The area on the right clearly has an adherent surface to this a very similar surface on the left. Unfortunately for this patient this is all too familiar problem. Clearly the Endoform is not working and will need to change that today that has some potential to help this surface. She does not tolerate debridement in this clinic very well. She is changing the dressing wants 04/03/16; patient arrives with the wounds looking somewhat better especially on the right. Dr. Migdalia Dk change the dressing to silver alginate when she saw her on Monday and also sold her some compression socks. The usefulness of the latter is really not clear and woman with severely draining wounds. 04/10/16; the patient is doing a bit of an experiment wearing the compression stockings that Dr. Migdalia Dk provided her to her left leg and the out of legs based dressings that we provided to the right. 05/01/16; the patient is continuing to wear compression stockings Dr. Migdalia Dk provided her on the left that are apparently silver impregnated. She has been using Iodoflex to the right leg wound. Still a moderate amount of drainage, when she leaves here the wraps only last for 4 days. She has to change the stocking on the left leg every night 05/15/16; she is now using compression stockings bilaterally provided by Dr. Marla Roe. She is wearing a nonadherent layer over the wounds so really I don't think there is anything specific being done to this now. She has some reduction on the left wound. The right is stable. I think all healing here is being done without a  specific dressing 06/09/16; patient arrives here today with not much change in the wound certainly in diameter to large circular wounds over the medial aspect of her ankle bilaterally. Under the light of these services are certainly not viable for healing. There is no evidence of surrounding infection. She is wearing compression stockings with some sort of silver impregnation as prescribed by Dr. Marla Roe. She has a follow-up with her tomorrow. 06/30/16; no major change in the size or condition of her wounds. These are still probably covered with a nonviable surface. She is using only her purchase stockings. She did see Dr. Marla Roe who seemed to want to apply Dakin's solution to this I'm not extreme short what value this would be. I would suggest Iodoflex which she still has at home. 07/28/16; I follow Mrs. Malsch episodically along with Dr. Marla Roe. 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 She has been applying a topical collagen ointment to both wounds with Adaptic. I don't think Dr. Marla Roe is planning to take her back to the OR. 08/19/16; I follow Mrs. Jeneen Rinks on a monthly basis along with Dr. Marla Roe of plastic surgery. 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 I been following her for a number of years. At one point I was able to get the right medial malleolus wound to heal and had the left medial malleolus down to about half its current size however and I had to send her to plastic surgery for an operative debridement. Since then things have been stable to slightly improve the area on the right is slightly better one in the left about the same although there is much less adherent surface than I'm used to with this patient. She is using some form of liquid collagen gel that Dr. Marla Roe provided a Kerlix cover with the patient's own  pressure stockings.  She tells me that she has extreme pain in both ankles and along the lateral aspect of both feet. She has been unable to work for some period of time. She is telling me she is retiring at the beginning of April. She sees Dr. Doran Durand of orthopedics next week 09/22/16; patient has not seen Dr. Marla Roe since the last time she is here. I'm not really sure what she is using to the wounds other than bits and pieces of think she had left over including most recently Hydrofera Blue. She is using juxtalite stockings. She is having difficulty with her husband's recent illness "stroke". She is having to transport him to various doctors appointments. Dr. Marla Roe left her the option of a repeat debridement with ACEL however she has not been able to get the time to follow-up on this. She continues to have a fair amount of drainage out of these wounds with certainly precludes leaving dressings on all week 10/13/16; patient has not seen Dr. Marla Roe since she was last in our clinic. I'm not really sure what she is doing with the wounds, we did try to get her [place] and I think she is actually using this most of the time. Because of drainage she states she has to change this every second day although this is an improvement from what she used to do. She went to see Dr. Doran Durand who did not think she had a muscular issue with regards to her feet, he referred her to a neurologist and I think the appointment is sometime in June. I changed her back to Iodoflex which she has used in the past but not recently. 11/03/16; the patient has been using Iodoflex although she ran out of this. Still claims that there is a lot of drainage although the wound does not look like this. No surrounding erythema. She has not been back to see Dr. Marla Roe 11/24/16; the patient has been using Iodoflex again but she ran out of it 2 or 3 days ago. There is no major change in the condition of either one of these  wounds in fact they are larger and covered in a thick adherent surface slough/nonviable tissue especially on the left. She does not tolerate mechanical debridement in our clinic. Going back to see Dr. Marla Roe of plastic surgery for an operative debridement would seem reasonable. 12/15/16; the patient has not been back to see Dr. Marla Roe. She is been dealing with a series of illnesses and her husband which of monopolized her time. She is been using Sorbact which we largely supplied. She states the drainage is bad enough that it maximum she can go 2-3 days without changing the dressing 01/12/2017 -- the patient has not been back for about 4 weeks and has not seen Dr. Marla Roe not does she have any appointment pending. 01/23/17; patient has not seen Dr. Marla Roe even though I suggested this previously. She is using Santyl that was suggested last week by Dr. Con Memos this Cost her $16 through her insurance which is indeed surprising 02/12/17; continuing Santyl and the patient is changing this daily. A lot of drainage. She has not been back to see plastic surgery she is using an Ace wrap. Our intake nurse suggested wrap around stockings which would make a good reasonable alternative 02/26/17; patient is been using Santyl and changing this daily due to drainage. She has not been to see plastic surgery she uses in April Ace wrap to control the edema. She did obtain extremitease stockings but stated  that the edema in her leg was to big for these 03/20/17; patient is using Santyl and Anasept. Surfaces looked better today the area on the right is actually measuring a little smaller. She has states she has a lot of pain in her feet and ankles and is asking for a consult to pain control which I'll try to help her with through our case manager. 04/10/17; the patient arrives with better-looking wound surfaces and is slightly smaller wound on the left she is using a combination of Santyl and Anasept. She has  an appointment or at least as started in the pain control center associated with [facility] 05/14/17; this is a patient who I followed for a prolonged period of time. 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 At one point I had this down to a much smaller wound on the left however these reopened and we've never been able to get these to heal. She has been using Santyl and Anasept gel although 2 weeks ago she ran out of the Anasept gel. She has a stable appearance of the wound. She is going to the wound care clinic at [facility]. They wanted do a nerve block/spinal block although she tells me she is reluctant to go forward with that. 05/21/17; this is a patient I have followed for many years. 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 Chronic pain and deformity in her ankles as well. She is been to see plastic surgery as well as orthopedics. Using PolyMem AG most recently/Kerramax/ABDs and 2 layer compression. She has managed to keep this on and she is coming in for a nurse check to change the dressing on Tuesdays, we see her on Fridays 06/05/17; really quite a good looking surface and the area especially on the right medial has contracted in terms of dimensions. Well granulated healthy-looking tissue on both sides. Even with an open curet there is nothing that even feels abnormal here. This is as good as I've seen this in quite some time. We have been using PolyMem AG and bringing her in for a nurse check 06/12/17; really quite good surface on both of these wounds. The right medial has contracted a bit left is not. We've been using PolyMem and AG and she is coming in for a nurse visit 06/19/17; we have been using PolyMem AG and bringing her in for a nurse check. Dimensions of her wounds are not better but the surfaces looked better bilaterally. She complained of bleeding last night and the left wound and increasing pain bilaterally. She  states her wound pain is more neuropathic than just the wounds. There was some suggestion that this was radicular from her pain management doctor in talking to her it is really difficult to sort this out. 06/26/17; using PolyMem and AG and bringing her in for a nurse check as All of this and reasonably stable condition. Certainly not improved. The dimensions on the lateral part of the right leg look better but not really measuring better. The medial aspect on the left is about the same. 07/03/16; we have been using PolyMen AG and bringing her in for a nurse check to change the dressings as the wounds have drainage which precludes once weekly changing. We are using all secondary absorptive dressings.our intake nurse is brought up the idea of using a wound VAC/snap VAC on the wound to help with the drainage to see if this would result in some contraction. This is not a  bad idea. The area on the right medial is actually looking smaller. Both wounds have a reasonable-looking surface. There is no evidence of cellulitis. The edema is well controlled 07/10/17; the patient was denied for a snap VAC by her insurance. The major issue with these wounds continues to be drainage. We are using wicked PolyMem AG and she is coming in for a nurse visit to change this. The wounds are stable to slightly improved. The surface looks vibrant and the area on the right certainly has shrunk in size but very slowly 07/17/17; the patient still has large wounds on her bilateral medial malleoli. Surface of both of these wounds looks better. The dimensions seem to come and go but no consistent improvement. There is no epithelialization. We do not have options for advanced treatment products due to insurance issues. They did not approve of the wound VAC to help control the drainage. More recently we've been using PolyMem and AG wicked to allow drainage through. We have been bringing her in for a nurse visit to change this. We do not  have a lot of options for wound care products and the home again due to insurance issues 07/24/17; the patient's wound actually looks somewhat better today. No drainage measurements are smaller still healthy-looking surface. We used silver collagen under PolyMen started last week. We have been bringing her in for a dressing change 07/31/17; patient's wound surface continued to look better and I think there is visible change in the dimensions of the wound on the right. Rims of epithelialization. We have been using silver collagen under PolyMen and bringing her in for a dressing change. There appears to be less drainage although she is still in need of the dressing change 08/07/17. Patient's wound surface continues to look better on both sides and the area on the right is definitely smaller. We have been using silver collagen and PolyMen. She feels that the drainage has been it has been better. I asked her about her vascular status. She went to see Dr. Aleda Grana at [facility] and had some form of ablation. I don't have much detail on this. I haven't my notes from 2016 that she was not a candidate for any further ablation but I don't have any more information on this. We had referred her to vein and vascular I don't think she ever went. He does not have a history of PAD although I don't have any information on this either. We don't even have ABIs in our record 08/14/17; we've been using silver collagen and PolyMen cover. And putting the patient and compression. She we are bringing her in as a nurse visit to change this because ofarge amount of drainage. We didn't the ABIs in clinic today since they had been done in many moons 1.2 bilaterally. She has been to see vein and vascular however this was at [facility] and she had ablation although I really don't have any information on this all seemed biking get a report. She is also been operatively debrided by plastic surgery and had a cell placed  probably 8-12 months ago. This didn't have a major effect. We've been making some gains with current dressings 08/19/17-She is here in follow-up evaluation for bilateral medial malleoli ulcers. She continues to tolerate debridement very poorly. We will continue with recently changed topical treatment; if no significant improvement may consider switching to Iodosorb/Iodoflex. 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 She has been using silver collagen and  PolyMem. I believe she has been used and tried on Iodoflex before. 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 These have been very refractory over time. We have been using silver collagen and PolyMen. She is coming in once a week for a doctor's and once a week for nurse visits. We are actually making some progress 09/18/17; the patient's wounds are smaller especially on the right medial. She arrives today to upset to consider even washing these off with Anasept which I think is been part of the reason this is been closing. We've been using collagen covered in PolyMen otherwise. It is noted that she has a small area of folliculitis on the right medial calf that. As we are wrapping her legs I'll give her a short course of doxycycline to make sure this doesn't amount to anything. She is a long list of complaints today including imbalance, shortness of breath on exertion, inversion of her left ankle. With regards to the latter complaints she is been to see orthopedics and they offered her a tendon release surgery I believe but wanted her wounds to be closed first. I have recommended she go see her primary physician with regards to everything else. 09/25/17; patient's wounds are about the same size. We have made  some progress bilaterally although not in recent weeks. She will not allow me T wash these wounds with Anasept even if she is doing her cell. Wheeze we've been using collagen covered in PolyMen. Last week she had a small area of folliculitis this is now opened into a small wound. She completed 5 days of trimethoprim sulfamethoxazole 10/02/17; unfortunately the area on her left medial ankle is worse with a larger wound area towards the Achilles. The patient complains of a lot of pain. She will not allow debridement although visually I don't think there is anything to debridement in any case. We have been using silver collagen and PolyMen for several months now. Initially we are making some progress although I'm not really seeing that today. We will move back to [facility]. His admittedly this is a bit of a repeat however I'm hoping that his situation is different now. The patient tells me she had her leg on the left give out on her yesterday this is process some pain. 10/09/17; the patient is seen twice a week largely because of drainage issues coming out of the chronic medial bimalleolar wounds that are chronic. Last week the dimensions of the one on the left looks a little larger I changed her to [facility]. She comes in today with a history of terrible pain in the bilateral wound areas. She will not allow debridement. She will not even allow a tissue culture. There is no surrounding erythema no no evidence of cellulitis. We have been putting her Kerlix Coban man. She will not allow more aggressive compression as there was a suggestion to put her in 3 layer wraps. 10/16/17; large wounds on her bilateral medial malleoli. These are chronic. Not much change from last week. The surface looks have healthy but absolutely no epithelialization. A lot of pain little less so of drainage. She will not allow debridement or even washing these off in the vigorous fashion with Anasept. 10/23/17; large wounds  on her bilateral malleoli which are chronic. Some improvement in terms of size  perhaps on the right since last time I saw these. She states that after we increased the 3 layer compression there was some bleeding, when she came in for a nurse visit she did not want 3 layer compression put back on about our nurse managed to convince her. She has known chronic venous visit issues and I'm hoping to get her to tolerate the 3 layer compression. using Hydrofera Blue 10/30/17; absolutely no change in the condition of either wound although we've had some improvement in dimensions on the right.. Attempted to put her in 3 layer compression she didn't tolerated she is back in 2 layer compression. We've been using Hydrofera Blue We looked over her past records. She had venous reflux studies in November 2016. There was no evidence of deep venous reflux on the right. Superficial vein did not show the greater saphenous vein at think this is been previously ablated the small saphenous vein was within normal limits. The left deep venous system showed no DVT the vessels were positive for deep venous reflux in the posterior tibial veins at the ankle. The greater saphenous vein was surgically absent small saphenous vein was within normal limits. She went to vein and vascular at [facility]. I believe she had an ablation on the left greater saphenous vein. I'll update her reflux studies perhaps ever reviewed by vein and vascular. We've made absolutely no progress in these wounds. Will also try to read and TheraSkins through her insurance 11/06/17; W the patient apparently has a 2 week follow-up with vein and vascular I like him to review the whole issue with regards to her previous vascular workup by Dr. Aleda Grana. We've really made no progress on these wounds in many months. She arrives today with less viable looking surface on the left medial ankle wound. This was apparently looking about the same on Tuesday when  she was here for nurse visit. 11/13/17; deep tissue culture I did last time of the left lower leg showed multiple organisms without any predominating. In particular no Staphylococcus or group A strep were isolated. We sent her for venous reflux studies. She's had a previous left greater saphenous vein stripping and I think sclerotherapy of the right greater saphenous vein. She didn't really look at the lesser saphenous vein this both wounds are on the medial aspect. She has reflux in the common femoral vein and popliteal vein and an accessory vein on the right and the common femoral vein and popliteal vein on the left. I'm going to have her go to see vein and vascular just the look over things and see if anything else beside aggressive compression is indicated here. We have not been able to make any progress on these wounds in spite of the fact that the surface of the wounds is never look too bad. 11/20/17; no major change in the condition of the wounds. Patient reports a large amount of drainage. She has a lot of complaints of pain although enlisting her today I wonder if some of this at least his neuropathic rather than secondary to her wounds. She has an appointment with vein and vascular on 12/30/17. The refractory nature of these wounds in my mind at least need vein and vascular to look over the wounds the recent reflux studies we did and her history to see if anything further can be done here. I also note her gait is deteriorated quite a bit. 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 She has a bilateral  Trendelenburg gait. I wonder if this is neuropathic or perhaps multilevel radicular. 11/27/17; her wounds actually looks slightly better. Healthy-looking granulation tissue a scant amount of epithelialization. [place] healthcare will not pay for [employer]. They will play for tri layer Oasis and Dermagraft. This is not a diabetic ulcer. We'll try for the tri layer Oasis. She still  complains of some drainage. She has a vein and vascular appointment on 12/30/17 12/04/17; the wounds visually look quite good. Healthy-looking granulation with some degree of epithelialization. We are still waiting for response to our request for trial to try layer Oasis. Her appointment with vascular to review venous and arterial issues isn't sold the end of July 7/31. Not allow debridement or even vigorous cleansing of the wound surface. 12/18/17; slightly smaller especially on the right. 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 We've been using Hydrofera Blue. We still are looking into triple layer Oasis through her insurance 01/08/18 on evaluation today patient's wound actually appears to be showing signs of good improvement at this point in time. She has been tolerating the dressing changes without complication. Fortunately there does not appear to be any evidence of infection at this point in time. We have been utilizing silver nitrate which does seem to be of benefit for her which is also good news. Overall I'm very happy with how things seem to be both regards appearance as well as measurement. Patient did see Dr. Bridgett Larsson for evaluation on 12/30/17. In his assessment he felt that stripping would not likely add much more than chronic compression to the patient's healing process. His recommendation was to follow-up in three months with Dr. Doren Custard if she hasn't healed in order to consider referral back to you and see vascular where she previously was in a trial and was able to get her wound to heal. I'll be see what she feels she when you staying compression and he reiterated this as well. 01/13/18 on evaluation today patient appears to actually be doing very well in regard to her bilateral medial malleolus ulcers. She seems to have tolerated the chemical cauterization with silver nitrate last week she did have some pain through that evening but fortunately states that I'll  be see since it seems to be doing better she is overall pleased with the progress. 01/21/18; really quite a remarkable improvement since I've last seen these wounds. We started using silver nitrate specially on the islands of hyper granulation which for some reason her around the wound circumference. This is really done quite nicely. Primary dressing Hydrofera Blue under 4 layer compression. She seems to be able to hold out without a nurse rewrap. Follow-up in 1 week 01/28/18; we've continued the hydrofera blue but continued with chemical cauterization to the wound area that we started about a month ago for irregular hyper granulation. She is made almost stunning improvement in the overall wound dimensions. I was not really expecting this degree of improvement in these chronic wounds 02/05/18; we continue with Hydrofera Bluebut of also continued the aggressive chemical cauterization with silver nitrate. We made nice progress with the right greater than left wound. 02/12/18. We continued with Hydrofera Blue after aggressive chemical cauterization with silver nitrate. We appear to be making nice progress with both wound areas 02/19/2018; we continue with [facility] after washing the wounds vigorously with Anasept spray and chemical cauterization with silver nitrate. We are making excellent progress. The area on the right's just about closed 02/26/2018. The area on the left medial ankle had  too much necrotic debris today. I used a #5 curette we are able to get most of the soft. I continued with the silver nitrate to the much smaller wound on the right medial ankle she had a new area on her right lower pretibial area which she says was due to a role in her compression 03/05/2018; both wound areas look healthy. Not much change in dimensions from last week. I continue to use silver nitrate and Hydrofera Blue. The patient saw Dr. Doren Custard of vein and vascular. He felt she had venous stasis ulcers. He felt  based on her previous arteriogram she should have adequate circulation for healing. Also she has deep venous reflux but really no significant correctable superficial venous reflux at this time. He felt we should continue with conservative management including leg elevation and compression 04/02/2018; since we last saw this woman about a month ago she had a fall apparently suffered a pelvic fracture. I did not look up the x-ray. Nevertheless because of pain she literally was bedbound for 2 weeks and had home health coming out to change the dressing. Somewhat predictably this is resulted in considerable improvement in both wound areas. The right is just about closed on the medial malleolus and the left is about half the size. 04/16/2018; both her wounds continue to go down in size. Using Hydrofera Blue. 05/07/18; both her wounds appeared to be improving especially on the right where it is almost closed. We are using Hydrofera Blue 05/14/2018; slightly worse this week with larger wounds. Surface on the left medial not quite as good. We have been using Hydrofera Blue 05/21/18; again the wounds are slightly larger. Left medial malleolus slightly larger with eschar around the circumference. We have been using Hydrofera Blue undergoing a wraps for a prolonged period of time. This got a lot better when she was more recumbent due to a fall and a back injury. I change the primary dressing the silver alginate today. She did not tolerate a 4 layer compression previously although I may need to bring this up with her next time 05/28/2018; area on the left medial malleolus again is slightly larger with more drainage. Area on the right is roughly unchanged. She has a small area of folliculitis on the right medial just on the lower calf. This does not look ominous. 06/03/2018 left medial malleolus slightly smaller in a better looking surface. We used silver nitrate on this last time with silver alginate. The area  on the right appears slightly smaller 1/10; left medial malleolus slightly smaller. Small open area on the right. We used silver nitrate and silver alginate as of 2 weeks ago. We continue with the wound and compression. These got a lot better when she was off her feet 1/17; right medial malleolus wound is smaller. The left may be slightly smaller. Both surfaces look somewhat better. 1/24; both wounds are slightly smaller. Using silver alginate under Unna boots 1/31; both wounds appear smaller in fact the area on the right medial is just about closed. Surface eschar. We have been using silver alginate under Unna boots. The patient is less active now spends let much less time on her feet and I think this is contributed to the general improvement in the wound condition 2/7; both wounds appear smaller. I was hopeful the right medial would be closed however there there is still the same small open area. Slight amount of surface eschar on the left the dimensions are smaller there is eschar but the wound  edges appear to be free. We have been using silver alginate under Unna boot's 2/14; both wounds once again measure smaller. Circumferential eschar on the left medial. We have been using silver alginate under Unna boots with gradual improvement 2/21; the area on the right medial malleolus has healed. The area on the left is smaller. We have been using silver alginate and Unna boots. We can discharge wrapping the right leg she has 20/30 stockings at home she will need to protect the scar tissue in this area 2/28; the area on the right medial malleolus remains closed the patient has a compression stocking. The area on the left is smaller. We have been using silver alginate and Unna boots. 3/6 the area on the right medial ankle remains closed. Good edema control noted she is using her own compression stocking. The area on the left medial ankle is smaller. We have been managing this with silver alginate and  Unna boots which we will continue today. 3/13; the area on the right medial ankle remains closed and I'm declaring it healed today. When necessary the left is about the same still a healthy-looking surface but no major change and wound area. No evidence of infection and using silver alginate under unna and generally making considerable improvement 3/27 the area on the right medial ankle remains closed the area on the left is about the same as last week. Certainly not any worse we have been using silver alginate under an Unna boot 4/3; the area on the right medial ankle remains closed per the patient. We did not look at this wound. The wound on the left medial ankle is about the same surface looks healthy we have been using silver alginate under an Unna boot 4/10; area on the right medial ankle remains closed per the patient. We did not look at this wound. The wound on the left medial ankle is slightly larger. The patient complains that the [facility] caused burning pain all week. She also told us that she was a lot more active this week. Changed her back to silver alginate 4/17; right medial ankle still closed per the patient. Left medial ankle is slightly larger. Using silver alginate. She did not tolerate Hydrofera Blue on this area 4/24; right medial ankle remains closed we have not look at this. The left medial ankle continues to get larger today by about a centimeter. We have been using silver alginate under Unna boots. She complains about 4 layer compression as an alternative. She has been up on her feet working on her garden 5/8; right medial ankle remains closed we did not look at this. The left medial ankle has increased in size about 100%. We have been using silver alginate under Unna boots. She noted increased pain this week and was not surprised that the wound is deteriorated 5/15; no major change in SA however much less erythema ( one week of doxy ocellulitis). 5/22-64 year old  female returns at 1 week to the clinic for left medial ankle wound for which we have been using silver alginate under 3 layer compression She was placed on DOXY at last visit - the wound is wider at this visit. She is in 3 layer compression 5/29; change to [facility] last week. I had given her empiric doxycycline 2 weeks ago for a week. She is in 3 layer compression. She complains of a lot of pain and drainage on presentation today. 6/5; using Hydrofera Blue. I gave her doxycycline recently empirically for erythema and  pain around the wound. Believe her cultures showed enterococcus which not would not have been well covered by doxycycline nevertheless the wound looks better and I don't feel specifically that the enterococcus needs to be covered. She has a new what looks like a wrap injury on her lateral left ankle. 6/12; she is using Hydrofera Blue. She has a new area on the left anterior lower tibial area. This was a wrap injury last week. 6/19; the patient is using Hydrofera Blue. She arrived with marked inflammation and erythema around the wound and tenderness. 12/01/18 on evaluation today patient appears to be doing a little bit better based on what I'm hearing from the standpoint of lassos evaluation to this as far as the overall appearance of the wound is concerned. Then sometime substandard she typically sees Dr. Dellia Nims. Nonetheless overall very pleased with the progress that she's made up to this point. No fevers, chills, nausea, or vomiting noted at this time. 7/10; some improvement in the surface area. Aggressively debrided last week apparently. I went ahead with the debridement today although the patient does not tolerate this very well. We have been using Iodoflex. Still a fair amount of drainage 7/17; slightly smaller. Using Iodoflex. 7/24; no change from last week in terms of surface area. We have been using Iodoflex. Surface looks and continues to look somewhat better 7/31;  surface area slightly smaller better looking surface. We have been using Iodoflex. This is under Unna boot compression 8/7-Patient presents at 1 week with Unna boot and Iodoflex, wound appears better 8/14-Patient presents at 1 week with Iodoflex, we use the Unna boot, wound appears to be stable better.Patient is getting Botox treatment for the inversion of the foot for tendon release, Next week 8/21; we are using Iodoflex. Unna boot. The wound is stable in terms of surface area. Under illumination there is some areas of the wound that appear to be either epithelialized or perhaps this is adherent slough at this point I was not really clear. It did not wipe off and I was reluctant to debride this today. 8/28; we are using Iodoflex in an Unna boot. Seems to be making good improvement. 9/4; using Iodoflex and wound is slightly smaller. 9/18; we are using Iodoflex with topical silver nitrate when she is here. The wound continues to be smaller 10/2; patient missed her appointment last week due to GI issues. She left and Iodoflex based dressing on for 2 weeks. Wound is about the same size about the size of a dime on the left medial lower 10/9 we have been using Iodoflex on the medial left ankle wound. She has a new superficial probable wrap injury on the dorsal left ankle 10/16; we have been using Hydrofera Blue since last week. This is on the left medial ankle 10/23; we have been using Hydrofera Blue since 2 weeks ago. This is on the left medial ankle. Dimensions are better 11/6; using Hydrofera Blue. I think the wound is smaller but still not closed. Left medial ankle 11/13; we have been using Hydrofera Blue. Wound is certainly no smaller this week. Also the surface not as good. This is the remanent of a very large area on her left medial ankle. 11/20; using Sorbact since last week. Wound was about the same in terms of size although I was disappointed about the surface debris 12/11; 3-week  follow-up. Patient was on vacation. Wound is measuring slightly larger we have been using Sorbact. 12/18; wound is about the same size however surface  looks better last week after debridement. We have been using Sorbact under compression 1/15 wound is probably twice the size of last time increased in length nonviable surface. We have been using Sorbact. She was running a mild fever and missed her appointment last week 1/22; the wound is come down in size but under illumination still a very adherent debris we have been Hydrofera Blue that I changed her to last week 1/29; dimensions down slightly. We have been using Hydrofera Blue Electronic Signature(s) Signed: 07/01/2019 5:55:42 PM By: Linton Ham MD Entered By: Linton Ham on 07/01/2019 11:21:50 -------------------------------------------------------------------------------- Physical Exam Details Patient Name: Date of Service: [name] 07/01/2019 10:00 AM Medical Record [id number] Patient Account Number: [id number] Date of Birth/Sex: Treating RN: 07-22-1955 (64 y.o. F) Primary Care Provider: [name] Other Clinician: Referring Provider: Treating Provider/Extender:Fatoumata Albaugh, Velva Harman, Essie Christine in Treatment: 342 Constitutional Wide pulse pressure but appears healthy. Pulse regular and within target range for patient.. Temperature is normal and within the target range for the patient.Marland Kitchen Appears in no distress. Respiratory work of breathing is normal. Cardiovascular Pedal pulses are palpable. Integumentary (Hair, Skin) There is no erythema around the wound no evidence of infection. Notes Wound exam; left medial malleolus under illumination the surface is not too bad. I elected not to debride this today. As long as this continues to contract I think we can spare her an aggressive mechanical debridement and I told her this. There is no evidence of surrounding infection Electronic Signature(s) Signed:  07/01/2019 5:55:42 PM By: Linton Ham MD Entered By: Linton Ham on 07/01/2019 11:26:10 -------------------------------------------------------------------------------- Physician Orders Details Patient Name: Date of Service: [name] 07/01/2019 10:00 AM Medical Record [id number] Patient Account Number: [id number] Date of Birth/Sex: Treating RN: 06-29-1955 (64 y.o. 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 - Friday Dressing Change Frequency Wound #3 Left,Medial Malleolus Do not change entire dressing for one week. - change dressing in one week Skin Barriers/Peri-Wound Care Moisturizing lotion TCA Cream or Ointment - mixed with lotion Wound Cleansing Clean wound with Wound Cleanser - clean with anasept with dressing changes May shower with protection. Primary Wound Dressing Wound #3 Left,Medial Malleolus Hydrofera Blue - classic Secondary Dressing Wound #3 Left,Medial Malleolus Dry Gauze Edema Control Unna Boot to Left Lower Extremity - no kerlix layer Avoid standing for long periods of time Elevate legs to the level of the heart or above for 30 minutes daily and/or when sitting, a frequency of: - throughout the day Support Garment 20-30 mm/Hg pressure to: - compression stocking right leg daily Electronic Signature(s) Signed: 07/01/2019 5:49:40 PM By: Kela Millin Signed: 07/01/2019 5:55:42 PM By: Linton Ham MD Entered By: Kela Millin on 07/01/2019  10:18:21 -------------------------------------------------------------------------------- Problem List Details Patient Name: Date of Service: [name] 07/01/2019 10:00 AM Medical Record [id number] Patient Account Number: [id number] Date of Birth/Sex: Treating RN: [birth year] (64 y.o. 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 08/03/2014 08/03/2014 and inflammation L03.116 Cellulitis of left lower limb 09/07/2014 09/07/2014 L97.321 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 breakdown 03/11/2019 03/11/2019 of skin Resolved Problems ICD-10 Code Description Active Date Resolved Date L97.312 Non-pressure chronic ulcer of right ankle with fat layer 04/10/2016 04/10/2016 exposed Electronic Signature(s) Signed: 07/01/2019 5:55:42 PM By: Linton Ham MD Entered By: Linton Ham on 07/01/2019 11:21:16 -------------------------------------------------------------------------------- Progress Note Details Patient Name: Date of Service: [name] 07/01/2019 10:00 AM Medical Record [id number] Patient Account Number: [id number] Date of Birth/Sex: Treating RN: Nov 29, 1955 (64 y.o. F) Primary Care Provider: [name] Other Clinician: Referring Provider: Treating Provider/Extender:Kash Davie, Velva Harman, Essie Christine in Treatment: 342 Subjective History of Present Illness (HPI) the remaining wound is over  the left medial ankle. Similar wound over the right medial ankle healed largely with use of Apligraf. Most recently we have been using Hydrofera Blue over this wound with considerable improvement. The patient has been extensively worked up in the past for her venous insufficiency and she is not a candidate for antireflux surgery although I have none of the details available currently. 08/24/14; considerable improvement today. About 50% of this wound areas now epithelialized. The base of the wound appears to be healthier granulation.as opposed to last week when she had deteriorated a considerable improvement 08/17/14; unfortunately the wound has regressed somewhat. The areas of epithelialization from the superior aspect are not nearly as healthy as they were last week. The patient thinks her Hydrofera Blue slipped. 09/07/14; unfortunately the area has markedly regressed in the 2 weeks since I've seen this. There is an odor surrounding erythema. The healthy granulation tissue that we had at the base of the wound now is a dusky color. The nurse reports green drainage 09/14/14; the area looks somewhat better than last week. There is less erythema and less drainage. The culture I did did not show any growth. Nevertheless I think it is better to continue the Cipro and doxycycline for a further week. The remaining wound area was debridement. 09/21/14. Wound did not require debridement last week. Still less erythema and less drainage. She can complete her antibiotics. The areas of epithelialization in the superior aspect of the wound do not look as healthy as they did some weeks ago 10/05/14 continued improvement in the condition of this wound. There is advancing epithelialization. Less aggressive debridement required 10/19/14 continued improvement in the condition and volume of this wound. Less aggressive debridement to the inferior part of this to remove surface slough and fibrinous eschar 11/02/14 no  debridement is required. The surface granulation appears healthy although some of her islands of epithelialization seem to have regressed. No evidence of infection 11/16/14; lites surface debridement done of surface eschar. The wound does not look to be unhealthy. No evidence of infection. Unfortunately the patient has had podiatry issues in the right foot and for some reason has redeveloped small surface ulcerations in the medial right ankle. Her original presentation involved wounds in this area 11/23/14 no debridement. The area on the right ankle has enlarged. The left ankle wound appears stable in terms of the surface although there is periwound inflammation. There has been regression in the amount of new skin 11/30/14 no debridement. Both wound areas appear healthy. There was no evidence of infection. The the new area on the right  medial ankle has enlarged although that both the surfaces appear to be stable. 12/07/14; Debridement of the right medial ankle wound. No no debridement was done on the left. 12/14/14 no major change in and now bilateral medial ankle wounds. Both of these are very painful but the no overt evidence of infection. She has had previous venous ablation 12/21/14; patient states that her right medial ankle wound is considerably more painful last week than usual. Her left is also somewhat painful. She could not tolerate debridement. The right medial ankle wound has fibrinous surface eschar 12/28/14 this is a patient with severe bilateral venous insufficiency ulcers. For a considerable period of time we actually had the one on the right medial ankle healed however this recently opened up again in June. The left medial ankle wound has been a refractory area with some absent flows. We had some success with Hydrofera Blue on this area and it literally closed by 50% however it is recently opened up Foley. Both of these were debridement today of surface eschar. She tolerates this  poorly 01/25/15: No change in the status of this. Thick adherent escar. Very poor tolerance of any attempt at debridement. I had healed the right medial malleolus wound for a considerable amount of time and had the left one down to about 50% of the volume although this is totally regressed over the last 48 weeks. Further the right leg has reopened. she is trying to make a appointment with pain and vascular, previous ablations with Dr. Aleda Grana. I do not believe there is an arterial insufficiency issue here 02/01/15 the status of the adherent eschar bilaterally is actually improved. No debridement was done. She did not manage to get vascular studies done 02/08/15 continued debridement of the area was done today. The slough is less adherent and comes off with less pressure. There is no surrounding infection peripheral pulses are intact 02/15/15 selective debridement with a disposable curette. Again the slough is less adherent and comes off with less difficulty. No surrounding infection peripheral pulses are intact. 02/22/15 selective debridement of the right medial ankle wound. Slough comes off with less difficulty. No obvious surrounding infection peripheral pulses are intact I did not debridement the one on the left. Both of these are stable to improved 03/01/15 selective debridement of both wound areas using a curette to. Adherent slough cup soft with less difficulty. No obvious surrounding infection. The patient tells me that 2 days ago she noted a rash above the right leg wrap. She did not have this on her lower legs when she change this over she arrives with widespread left greater than right almost folliculitis-looking rash which is extremely pruritic. I don't see anything to culture here. There is no rash on the rest of her body. She feels well systemically. 03/08/15; selective debridement of both wounds using a curette. Base of this does not look unhealthy. She had limegreen drainage coming  out of the left leg wound and describes a lot of drainage. The rash on her left leg looks improved to. No cultures were done. 03/22/15; patient was not here last week. Basal wounds does not look healthy and there is no surrounding erythema. No drainage. There is still a rash on the left leg that almost looks vasculitic however it is clearly limited to the top of where the wrap would be. 04/05/15; on the right required a surgical debridement of surface eschar and necrotic subcutaneous tissue. I did not debridement the area on the left. These continue to  be large open wounds that are not changing that much. We were successful at one point in healing the area on the right, and at the same time the area on the left was roughly half the size of current measurements. I think a lot of the deterioration has to do with the prolonged time the patient is on her feet at work 04/19/15 I attempted-like surface debridement bilaterally she does not tolerate this. She tells me that she was in allergic care yesterday with extreme pain over her left lateral malleolus/ankle and was told that she has an "sprain" 05/03/15; large bilateral venous insufficiency wounds over the medial malleolus/medial aspect of her ankles. She complains of copious amounts of drainage and his usual large amounts of pain. There is some increasing erythema around the wound on the right extending into the medial aspect of her foot to. historically she came in with these wounds the right one healed and the left one came down to roughly half its current size however the right one is reopened and the left is expanded. 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 05/10/15 large bilateral venous insufficiency wounds. There is less adherence surface left however the surface culture that I did last week grew pseudomonas therefore bilateral selective debridement score necessary. There is surrounding  erythema. The patient describes severe bilateral drainage and a lot of pain in the left ankle. Apparently her podiatrist was were ready to do a cortisone shot 05/17/15; the patient complains of pain and again copious amounts of drainage. 05/24/15; we used Iodo flex last week. Patient notes considerable improvement in wound drainage. Only needed to change this once. 05/31/15; we continued Iodoflex; the base of these large wounds bilaterally is not too bad but there is probably likely a significant bioburden here. I would like to debridement just doesn't tolerate it. 06/06/14 I would like to continue the Iodoflex although she still hasn't managed to obtain supplies. She has bilateral medial malleoli or large wounds which are mostly superficial. Both of them are covered circumferentially with some nonviable fibrinous slough although she tolerates debridement very poorly. She apparently has an appointment for an ablation on the right leg by interventional radiology. 06/14/15; the patient arrives with the wounds and static condition. We attempted a debridement although she does not do well with this secondary to pain. I 07/05/15; wounds are not much smaller however there appears to be a cleaner granulating base. The left has tight fibrinous slough greater than the right. Debridement is tolerated poorly due to pain. Iodoflex is done more for these wounds in any of the multitude of different dressings I have tried on the left 1 and then subsequently the right. 07/12/15; no change in the condition of this wound. I am able to do an aggressive debridement on the right but not the left. She simply cannot tolerate it. We have been using Iodoflex which helps somewhat. It is worthwhile remembering that at one point we healed the right medial ankle wound and the left was about 25% of the current circumference. We have suggested returning to vascular surgery for review of possible further ablations for one reason or  another she has not been able to do this. 07/26/15 no major change in the condition of either wound on her medial ankle. I did not attempt to debridement of these. She has been aggressively scrubbing these while she is in the shower at home. She has  her supply of Iodoflex which seems to have done more for these wounds then anything I have put on recently. 08/09/15 wound area appears larger although not verified by measurements. Using Iodoflex 09/05/2015 -- she was here for avisit today but had significant problems with the wound and I was asked to see her for a physician opinion. I have summarize that this lady has had surgery on her left lower extremity about 10 years ago where the possible veins stripping was done. She has had an opinion from interventional radiology around November 2016 where no further sclerotherapy was ordered. The patient works 12 hours a day and stands on a concrete floor with work boots and is unable to get the proper compression she requires and cannot elevate her limbs appropriately at any given time. She has recently grown Pseudomonas from her wound culture but has not started her ciprofloxacin which was called in for her. 09/13/15 this continues to be a difficult situation for this patient. At one point I had this wound down to a 1.5 x 1.5" wound on her left leg. This is deteriorated and the right leg has reopened. She now has substantial wounds on her medial calcaneus, malleoli and into her lower leg. One on the left has surface eschar but these are far too painful for me to debridement here. She has a vascular surgery appointment next week to see if anything can be done to help here. I think she has had previous ablations several years ago at [facility]. She has no major edema. She tells me that she did not get product last time [facility] and went for several days without it. She continues to work in work boots 12 hours a day. She cannot get compression/4-layer  under her work boots. 09/20/15 no major change. Periwound edema control was not very good. Her point with pain and vascular is next Wednesday the 25th 09/28/15; the patient is seen vascular surgery and is apparently scheduled for repeat duplex ultrasounds of her bilateral lower legs next week. 10/05/15; the patient was seen by Dr. Doren Custard of vascular surgery. He feels that she should have arterial insufficiency excluded as cause/contributed to her nonhealing stage she is therefore booked for an arteriogram. She has apparently monophasic signals in the dorsalis pedis pulses. She also of course has known severe chronic venous insufficiency with previous procedures as noted previously. I had another long discussion with the patient today about her continuing to work 12 hour shifts. I've written her out for 2 months area had concerns about this as her work location is currently undergoing significant turmoil and this may lead to her termination. She is aware of this however I agree with her that she simply cannot continue to stand for 12 hours multiple days a week with the substantial wound areas she has. 10/19/15; the Dr. Doren Custard appointment was largely for an arteriogram which was normal. She does not have an arterial issue. He didn't make a comment about her chronic venous insufficiency for which she has had previous ablations. Presumably it was not felt that anything additional could be done. The patient is now out of work as I prescribed 2 weeks ago. Her wounds look somewhat less aggravated presumably because of this. I felt I would give debridement another try today 10/25/15; no major change in this patient's wounds. We are struggling to get her product that she can afford into her own home through her insurance. 11/01/15; no major change in the patient's wounds. I have been  using silver alginate as the most affordable product. I spoke to Dr. Marla Roe last week with her requested take her to the OR for  surgical debridement and placement of ACEL. Dr. Marla Roe told me that she would be willing to do this however [facility] will not cover this, fortunately the patient has [place] healthcare of some variant 11/08/15; no major change in the patient's wounds. She has been completely nonviable surface that this but is in too much pain with any attempted debridement are clinic. I have arranged for her to see Dr. Marla Roe ham of plastic surgery and this appointment is on Monday. I am hopeful that they will take her to the OR for debridement, possible ACEL ultimately possible skin graft 11/22/15 no major change in the patient's wounds over her bilateral medial calcaneus medial malleolus into the lower legs. Surface on these does not look too bad however on the left there is surrounding erythema and tenderness. This may be cellulitis or could him sleepy tinea. 11/29/15; no major changes in the patient's wounds over her bilateral medial malleolus. There is no infection here and I don't think any additional antibiotics are necessary. There is now plan to move forward. She sees Dr. Marla Roe in a week's time for preparation for operative debridement and ACEL placement I believe on 7/12. She then has a follow-up appointment with Dr. Marla Roe on 7/21 12/28/15; the patient returns today having been taken to the [facility] by Dr. Marla Roe 12/12/15 she underwent debridement, intraoperative cultures [which were negative]. She had placement of a wound VAC. Parent really ACEL was not available to be placed. The wound VAC foam apparently adhered to the wound since then she's been using silver alginate, Xeroform under Ace wraps. She still says there is a lot of drainage and a lot of pain 01/31/16; this is a patient I see monthly. I had referred her to Dr. Marla Roe him of plastic surgery for large wounds on her bilateral medial ankles. She has been to the OR twice once in early July and once in early August. She  tells me over the last 3 weeks she has been using the wound VAC with ACEL underneath it. On the right we've simply been using silver alginate. Under Kerlix Coban wraps. 02/28/16; this is a patient I'm currently seeing monthly. She is gone on to have a skin graft over her large venous insufficiency ulcer on the left medial ankle. This was done by Dr. Marla Roe him. The patient is a bit perturbed about why she didn't have one on her right medial ankle wound. She has been using silver alginate to this. 03/06/16; I received a phone call from her plastic surgery Dr. Marla Roe. She expressed some concern about the viability of the skin graft she did on the left medial ankle wound. Asked me to place Endoform on this. She told me she is not planning to do a subsequent skin graft on the right as the left one did not take very well. I had placed Hydrofera Blue on the right 03/13/16; continue to have a reasonably healthy wound on the right medial ankle. Down to 3 mm in terms of size. There is epithelialization here. The area on the left medial ankle is her skin graft site. I suppose the last week this looks somewhat better. She has an open area inferiorly however in the center there appears to be some viable tissue. There is a lot of surface callus and eschar that will eventually need to come off however  none of this looked to be infected. Patient states that the is able to keep the dressing on for several days which is an improvement. 03/20/16 no major change in the circumference of either wound however on the left side the patient was at Dr. Eusebio Friendly office and they did a debridement of left wound. 50% of the wound seems to be epithelialized. I been using Endoform on the left Hydrofera Blue in the right 03/27/16; she arrives today with her wound is not looking as healthy as they did last week. The area on the right clearly has an adherent surface to this a very similar surface on the left.  Unfortunately for this patient this is all too familiar problem. Clearly the Endoform is not working and will need to change that today that has some potential to help this surface. She does not tolerate debridement in this clinic very well. She is changing the dressing wants 04/03/16; patient arrives with the wounds looking somewhat better especially on the right. Dr. Migdalia Dk change the dressing to silver alginate when she saw her on Monday and also sold her some compression socks. The usefulness of the latter is really not clear and woman with severely draining wounds. 04/10/16; the patient is doing a bit of an experiment wearing the compression stockings that Dr. Migdalia Dk provided her to her left leg and the out of legs based dressings that we provided to the right. 05/01/16; the patient is continuing to wear compression stockings Dr. Migdalia Dk provided her on the left that are apparently silver impregnated. She has been using Iodoflex to the right leg wound. Still a moderate amount of drainage, when she leaves here the wraps only last for 4 days. She has to change the stocking on the left leg every night 05/15/16; she is now using compression stockings bilaterally provided by Dr. Marla Roe. She is wearing a nonadherent layer over the wounds so really I don't think there is anything specific being done to this now. She has some reduction on the left wound. The right is stable. I think all healing here is being done without a specific dressing 06/09/16; patient arrives here today with not much change in the wound certainly in diameter to large circular wounds over the medial aspect of her ankle bilaterally. Under the light of these services are certainly not viable for healing. There is no evidence of surrounding infection. She is wearing compression stockings with some sort of silver impregnation as prescribed by Dr. Marla Roe. She has a follow-up with her tomorrow. 06/30/16; no major change in the  size or condition of her wounds. These are still probably covered with a nonviable surface. She is using only her purchase stockings. She did see Dr. Marla Roe who seemed to want to apply Dakin's solution to this I'm not extreme short what value this would be. I would suggest Iodoflex which she still has at home. 07/28/16; I follow Mrs. Rodrick episodically along with Dr. Marla Roe. 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 She has been applying a topical collagen ointment to both wounds with Adaptic. I don't think Dr. Marla Roe is planning to take her back to the OR. 08/19/16; I follow Mrs. Jeneen Rinks on a monthly basis along with Dr. Marla Roe of plastic surgery. 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 I been following her for a number of years. At one point I was able to get the right  medial malleolus wound to heal and had the left medial malleolus down to about half its current size however and I had to send her to plastic surgery for an operative debridement. Since then things have been stable to slightly improve the area on the right is slightly better one in the left about the same although there is much less adherent surface than I'm used to with this patient. She is using some form of liquid collagen gel that Dr. Marla Roe provided a Kerlix cover with the patient's own pressure stockings. She tells me that she has extreme pain in both ankles and along the lateral aspect of both feet. She has been unable to work for some period of time. She is telling me she is retiring at the beginning of April. She sees Dr. Doran Durand of orthopedics next week 09/22/16; patient has not seen Dr. Marla Roe since the last time she is here. I'm not really sure what she is using to the wounds other than bits and pieces of think she had left over including most recently Hydrofera Blue.  She is using juxtalite stockings. She is having difficulty with her husband's recent illness "stroke". She is having to transport him to various doctors appointments. Dr. Marla Roe left her the option of a repeat debridement with ACEL however she has not been able to get the time to follow-up on this. She continues to have a fair amount of drainage out of these wounds with certainly precludes leaving dressings on all week 10/13/16; patient has not seen Dr. Marla Roe since she was last in our clinic. I'm not really sure what she is doing with the wounds, we did try to get her [facility] and I think she is actually using this most of the time. Because of drainage she states she has to change this every second day although this is an improvement from what she used to do. She went to see Dr. Doran Durand who did not think she had a muscular issue with regards to her feet, he referred her to a neurologist and I think the appointment is sometime in June. I changed her back to Iodoflex which she has used in the past but not recently. 11/03/16; the patient has been using Iodoflex although she ran out of this. Still claims that there is a lot of drainage although the wound does not look like this. No surrounding erythema. She has not been back to see Dr. Marla Roe 11/24/16; the patient has been using Iodoflex again but she ran out of it 2 or 3 days ago. There is no major change in the condition of either one of these wounds in fact they are larger and covered in a thick adherent surface slough/nonviable tissue especially on the left. She does not tolerate mechanical debridement in our clinic. Going back to see Dr. Marla Roe of plastic surgery for an operative debridement would seem reasonable. 12/15/16; the patient has not been back to see Dr. Marla Roe. She is been dealing with a series of illnesses and her husband which of monopolized her time. She is been using Sorbact which we largely supplied. She states  the drainage is bad enough that it maximum she can go 2-3 days without changing the dressing 01/12/2017 -- the patient has not been back for about 4 weeks and has not seen Dr. Marla Roe not does she have any appointment pending. 01/23/17; patient has not seen Dr. Marla Roe even though I suggested this previously. She is using Santyl that was suggested last week by Dr.  Britto this Cost her $16 through her insurance which is indeed surprising 02/12/17; continuing Santyl and the patient is changing this daily. A lot of drainage. She has not been back to see plastic surgery she is using an Ace wrap. Our intake nurse suggested wrap around stockings which would make a good reasonable alternative 02/26/17; patient is been using Santyl and changing this daily due to drainage. She has not been to see plastic surgery she uses in April Ace wrap to control the edema. She did obtain extremitease stockings but stated that the edema in her leg was to big for these 03/20/17; patient is using Santyl and Anasept. Surfaces looked better today the area on the right is actually measuring a little smaller. She has states she has a lot of pain in her feet and ankles and is asking for a consult to pain control which I'll try to help her with through our case manager. 04/10/17; the patient arrives with better-looking wound surfaces and is slightly smaller wound on the left she is using a combination of Santyl and Anasept. She has an appointment or at least as started in the pain control center associated with [facility] 05/14/17; this is a patient who I followed for a prolonged period of time. 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 At one point I had this down to a much smaller wound on the left however these reopened and we've never been able to get these to heal. She has been using Santyl and Anasept gel although 2 weeks ago she ran out of the Anasept gel. She has a stable appearance  of the wound. She is going to the wound care clinic at [facility]. They wanted do a nerve block/spinal block although she tells me she is reluctant to go forward with that. 05/21/17; this is a patient I have followed for many years. 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 Chronic pain and deformity in her ankles as well. She is been to see plastic surgery as well as orthopedics. Using PolyMem AG most recently/Kerramax/ABDs and 2 layer compression. She has managed to keep this on and she is coming in for a nurse check to change the dressing on Tuesdays, we see her on Fridays 06/05/17; really quite a good looking surface and the area especially on the right medial has contracted in terms of dimensions. Well granulated healthy-looking tissue on both sides. Even with an open curet there is nothing that even feels abnormal here. This is as good as I've seen this in quite some time. We have been using PolyMem AG and bringing her in for a nurse check 06/12/17; really quite good surface on both of these wounds. The right medial has contracted a bit left is not. We've been using PolyMem and AG and she is coming in for a nurse visit 06/19/17; we have been using PolyMem AG and bringing her in for a nurse check. Dimensions of her wounds are not better but the surfaces looked better bilaterally. She complained of bleeding last night and the left wound and increasing pain bilaterally. She states her wound pain is more neuropathic than just the wounds. There was some suggestion that this was radicular from her pain management doctor in talking to her it is really difficult to sort this out. 06/26/17; using PolyMem and AG and bringing her in for a nurse check as All of this and reasonably stable condition. Certainly not improved. The dimensions on the lateral  part of the right leg look better but not really measuring better. The medial aspect on the left is about the same. 07/03/16;  we have been using PolyMen AG and bringing her in for a nurse check to change the dressings as the wounds have drainage which precludes once weekly changing. We are using all secondary absorptive dressings.our intake nurse is brought up the idea of using a wound VAC/snap VAC on the wound to help with the drainage to see if this would result in some contraction. This is not a bad idea. The area on the right medial is actually looking smaller. Both wounds have a reasonable-looking surface. There is no evidence of cellulitis. The edema is well controlled 07/10/17; the patient was denied for a snap VAC by her insurance. The major issue with these wounds continues to be drainage. We are using wicked PolyMem AG and she is coming in for a nurse visit to change this. The wounds are stable to slightly improved. The surface looks vibrant and the area on the right certainly has shrunk in size but very slowly 07/17/17; the patient still has large wounds on her bilateral medial malleoli. Surface of both of these wounds looks better. The dimensions seem to come and go but no consistent improvement. There is no epithelialization. We do not have options for advanced treatment products due to insurance issues. They did not approve of the wound VAC to help control the drainage. More recently we've been using PolyMem and AG wicked to allow drainage through. We have been bringing her in for a nurse visit to change this. We do not have a lot of options for wound care products and the home again due to insurance issues 07/24/17; the patient's wound actually looks somewhat better today. No drainage measurements are smaller still healthy-looking surface. We used silver collagen under PolyMen started last week. We have been bringing her in for a dressing change 07/31/17; patient's wound surface continued to look better and I think there is visible change in the dimensions of the wound on the right. Rims of epithelialization.  We have been using silver collagen under PolyMen and bringing her in for a dressing change. There appears to be less drainage although she is still in need of the dressing change 08/07/17. Patient's wound surface continues to look better on both sides and the area on the right is definitely smaller. We have been using silver collagen and PolyMen. She feels that the drainage has been it has been better. I asked her about her vascular status. She went to see Dr. Aleda Grana at [facility] and had some form of ablation. I don't have much detail on this. I haven't my notes from 2016 that she was not a candidate for any further ablation but I don't have any more information on this. We had referred her to vein and vascular I don't think she ever went. He does not have a history of PAD although I don't have any information on this either. We don't even have ABIs in our record 08/14/17; we've been using silver collagen and PolyMen cover. And putting the patient and compression. She we are bringing her in as a nurse visit to change this because ofarge amount of drainage. We didn't the ABIs in clinic today since they had been done in many moons 1.2 bilaterally. She has been to see vein and vascular however this was at [facility] and she had ablation although I really don't have any information on this all  seemed biking get a report. She is also been operatively debrided by plastic surgery and had a cell placed probably 8-12 months ago. This didn't have a major effect. We've been making some gains with current dressings 08/19/17-She is here in follow-up evaluation for bilateral medial malleoli ulcers. She continues to tolerate debridement very poorly. We will continue with recently changed topical treatment; if no significant improvement may consider switching to Iodosorb/Iodoflex. 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 She has been using silver collagen  and PolyMem. I believe she has been used and tried on Iodoflex before. 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 These have been very refractory over time. We have been using silver collagen and PolyMen. She is coming in once a week for a doctor's and once a week for nurse visits. We are actually making some progress 09/18/17; the patient's wounds are smaller especially on the right medial. She arrives today to upset to consider even washing these off with Anasept which I think is been part of the reason this is been closing. We've been using collagen covered in PolyMen otherwise. It is noted that she has a small area of folliculitis on the right medial calf that. As we are wrapping her legs I'll give her a short course of doxycycline to make sure this doesn't amount to anything. She is a long list of complaints today including imbalance, shortness of breath on exertion, inversion of her left ankle. With regards to the latter complaints she is been to see orthopedics and they offered her a tendon release surgery I believe but wanted her wounds to be closed first. I have recommended she go see her primary physician with regards to everything else. 09/25/17; patient's wounds are about the same size. We have made some progress bilaterally although not in recent weeks. She will not allow me T wash these wounds with Anasept even if she is doing her cell. Wheeze we've been using collagen covered in PolyMen. Last week she had a small area of folliculitis this is now opened into a small wound. She completed 5 days of trimethoprim sulfamethoxazole 10/02/17; unfortunately the area on her left medial ankle is worse with a larger wound area towards the Achilles. The patient complains of a lot of pain. She will not allow  debridement although visually I don't think there is anything to debridement in any case. We have been using silver collagen and PolyMen for several months now. Initially we are making some progress although I'm not really seeing that today. We will move back to [facility]. His admittedly this is a bit of a repeat however I'm hoping that his situation is different now. The patient tells me she had her leg on the left give out on her yesterday this is process some pain. 10/09/17; the patient is seen twice a week largely because of drainage issues coming out of the chronic medial bimalleolar wounds that are chronic. Last week the dimensions of the one on the left looks a little larger I changed her to [facility]. She comes in today with a history of terrible pain in the bilateral wound areas. She will not allow debridement. She will not even allow a tissue culture. There is no surrounding erythema no no evidence of cellulitis. We have  been putting her Kerlix Coban man. She will not allow more aggressive compression as there was a suggestion to put her in 3 layer wraps. 10/16/17; large wounds on her bilateral medial malleoli. These are chronic. Not much change from last week. The surface looks have healthy but absolutely no epithelialization. A lot of pain little less so of drainage. She will not allow debridement or even washing these off in the vigorous fashion with Anasept. 10/23/17; large wounds on her bilateral malleoli which are chronic. Some improvement in terms of size perhaps on the right since last time I saw these. She states that after we increased the 3 layer compression there was some bleeding, when she came in for a nurse visit she did not want 3 layer compression put back on about our nurse managed to convince her. She has known chronic venous visit issues and I'm hoping to get her to tolerate the 3 layer compression. using Hydrofera Blue 10/30/17; absolutely no change in the  condition of either wound although we've had some improvement in dimensions on the right.. Attempted to put her in 3 layer compression she didn't tolerated she is back in 2 layer compression. We've been using Hydrofera Blue We looked over her past records. She had venous reflux studies in November 2016. There was no evidence of deep venous reflux on the right. Superficial vein did not show the greater saphenous vein at think this is been previously ablated the small saphenous vein was within normal limits. The left deep venous system showed no DVT the vessels were positive for deep venous reflux in the posterior tibial veins at the ankle. The greater saphenous vein was surgically absent small saphenous vein was within normal limits. She went to vein and vascular at [facility]. I believe she had an ablation on the left greater saphenous vein. I'll update her reflux studies perhaps ever reviewed by vein and vascular. We've made absolutely no progress in these wounds. Will also try to read and TheraSkins through her insurance 11/06/17; W the patient apparently has a 2 week follow-up with vein and vascular I like him to review the whole issue with regards to her previous vascular workup by Dr. Aleda Grana. We've really made no progress on these wounds in many months. She arrives today with less viable looking surface on the left medial ankle wound. This was apparently looking about the same on Tuesday when she was here for nurse visit. 11/13/17; deep tissue culture I did last time of the left lower leg showed multiple organisms without any predominating. In particular no Staphylococcus or group A strep were isolated. We sent her for venous reflux studies. She's had a previous left greater saphenous vein stripping and I think sclerotherapy of the right greater saphenous vein. She didn't really look at the lesser saphenous vein this both wounds are on the medial aspect. She has reflux in the common  femoral vein and popliteal vein and an accessory vein on the right and the common femoral vein and popliteal vein on the left. I'm going to have her go to see vein and vascular just the look over things and see if anything else beside aggressive compression is indicated here. We have not been able to make any progress on these wounds in spite of the fact that the surface of the wounds is never look too bad. 11/20/17; no major change in the condition of the wounds. Patient reports a large amount of drainage. She has a lot of complaints of pain  although enlisting her today I wonder if some of this at least his neuropathic rather than secondary to her wounds. She has an appointment with vein and vascular on 12/30/17. The refractory nature of these wounds in my mind at least need vein and vascular to look over the wounds the recent reflux studies we did and her history to see if anything further can be done here. I also note her gait is deteriorated quite a bit. 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 She has a bilateral Trendelenburg gait. I wonder if this is neuropathic or perhaps multilevel radicular. 11/27/17; her wounds actually looks slightly better. Healthy-looking granulation tissue a scant amount of epithelialization. [place] [facility] will not pay for [employer]. They will play for tri layer Oasis and Dermagraft. This is not a diabetic ulcer. We'll try for the tri layer Oasis. She still complains of some drainage. She has a vein and vascular appointment on 12/30/17 12/04/17; the wounds visually look quite good. Healthy-looking granulation with some degree of epithelialization. We are still waiting for response to our request for trial to try layer Oasis. Her appointment with vascular to review venous and arterial issues isn't sold the end of July 7/31. Not allow debridement or even vigorous cleansing of the wound surface. 12/18/17; slightly smaller especially on the right. Both wounds  have epithelialization superiorly some hyper granulation. We've been using Hydrofera Blue. We still are looking into triple layer Oasis through her insurance 01/08/18 on evaluation today patient's wound actually appears to be showing signs of good improvement at this point in time. She has been tolerating the dressing changes without complication. Fortunately there does not appear to be any evidence of infection at this point in time. We have been utilizing silver nitrate which does seem to be of benefit for her which is also good news. Overall I'm very happy with how things seem to be both regards appearance as well as measurement. Patient did see Dr. Bridgett Larsson for evaluation on 12/30/17. In his assessment he felt that stripping would not likely add much more than chronic compression to the patient's healing process. His recommendation was to follow-up in three months with Dr. Doren Custard if she hasn't healed in order to consider referral back to you and see vascular where she previously was in a trial and was able to get her wound to heal. I'll be see what she feels she when you staying compression and he reiterated this as well. 01/13/18 on evaluation today patient appears to actually be doing very well in regard to her bilateral medial malleolus ulcers. She seems to have tolerated the chemical cauterization with silver nitrate last week she did have some pain through that evening but fortunately states that I'll be see since it seems to be doing better she is overall pleased with the progress. 01/21/18; really quite a remarkable improvement since I've last seen these wounds. We started using silver nitrate specially on the islands of hyper granulation which for some reason her around the wound circumference. This is really done quite nicely. Primary dressing Hydrofera Blue under 4 layer compression. She seems to be able to hold out without a nurse rewrap. Follow-up in 1 week 01/28/18; we've continued the  hydrofera blue but continued with chemical cauterization to the wound area that we started about a month ago for irregular hyper granulation. She is made almost stunning improvement in the overall wound dimensions. I was not really expecting this degree of improvement in these chronic wounds  02/05/18; we continue with Hydrofera Bluebut of also continued the aggressive chemical cauterization with silver nitrate. We made nice progress with the right greater than left wound. 02/12/18. We continued with Hydrofera Blue after aggressive chemical cauterization with silver nitrate. We appear to be making nice progress with both wound areas 02/19/2018; we continue with [facility] after washing the wounds vigorously with Anasept spray and chemical cauterization with silver nitrate. We are making excellent progress. The area on the right's just about closed 02/26/2018. The area on the left medial ankle had too much necrotic debris today. I used a #5 curette we are able to get most of the soft. I continued with the silver nitrate to the much smaller wound on the right medial ankle she had a new area on her right lower pretibial area which she says was due to a role in her compression 03/05/2018; both wound areas look healthy. Not much change in dimensions from last week. I continue to use silver nitrate and Hydrofera Blue. The patient saw Dr. Doren Custard of vein and vascular. He felt she had venous stasis ulcers. He felt based on her previous arteriogram she should have adequate circulation for healing. Also she has deep venous reflux but really no significant correctable superficial venous reflux at this time. He felt we should continue with conservative management including leg elevation and compression 04/02/2018; since we last saw this woman about a month ago she had a fall apparently suffered a pelvic fracture. I did not look up the x-ray. Nevertheless because of pain she literally was bedbound for 2 weeks and  had home health coming out to change the dressing. Somewhat predictably this is resulted in considerable improvement in both wound areas. The right is just about closed on the medial malleolus and the left is about half the size. 04/16/2018; both her wounds continue to go down in size. Using Hydrofera Blue. 05/07/18; both her wounds appeared to be improving especially on the right where it is almost closed. We are using Hydrofera Blue 05/14/2018; slightly worse this week with larger wounds. Surface on the left medial not quite as good. We have been using Hydrofera Blue 05/21/18; again the wounds are slightly larger. Left medial malleolus slightly larger with eschar around the circumference. We have been using Hydrofera Blue undergoing a wraps for a prolonged period of time. This got a lot better when she was more recumbent due to a fall and a back injury. I change the primary dressing the silver alginate today. She did not tolerate a 4 layer compression previously although I may need to bring this up with her next time 05/28/2018; area on the left medial malleolus again is slightly larger with more drainage. Area on the right is roughly unchanged. She has a small area of folliculitis on the right medial just on the lower calf. This does not look ominous. 06/03/2018 left medial malleolus slightly smaller in a better looking surface. We used silver nitrate on this last time with silver alginate. The area on the right appears slightly smaller 1/10; left medial malleolus slightly smaller. Small open area on the right. We used silver nitrate and silver alginate as of 2 weeks ago. We continue with the wound and compression. These got a lot better when she was off her feet 1/17; right medial malleolus wound is smaller. The left may be slightly smaller. Both surfaces look somewhat better. 1/24; both wounds are slightly smaller. Using silver alginate under Unna boots 1/31; both wounds appear smaller in  fact the area on the right medial is just about closed. Surface eschar. We have been using silver alginate under Unna boots. The patient is less active now spends let much less time on her feet and I think this is contributed to the general improvement in the wound condition 2/7; both wounds appear smaller. I was hopeful the right medial would be closed however there there is still the same small open area. Slight amount of surface eschar on the left the dimensions are smaller there is eschar but the wound edges appear to be free. We have been using silver alginate under Unna boot's 2/14; both wounds once again measure smaller. Circumferential eschar on the left medial. We have been using silver alginate under Unna boots with gradual improvement 2/21; the area on the right medial malleolus has healed. The area on the left is smaller. We have been using silver alginate and Unna boots. We can discharge wrapping the right leg she has 20/30 stockings at home she will need to protect the scar tissue in this area 2/28; the area on the right medial malleolus remains closed the patient has a compression stocking. The area on the left is smaller. We have been using silver alginate and Unna boots. 3/6 the area on the right medial ankle remains closed. Good edema control noted she is using her own compression stocking. The area on the left medial ankle is smaller. We have been managing this with silver alginate and Unna boots which we will continue today. 3/13; the area on the right medial ankle remains closed and I'm declaring it healed today. When necessary the left is about the same still a healthy-looking surface but no major change and wound area. No evidence of infection and using silver alginate under unna and generally making considerable improvement 3/27 the area on the right medial ankle remains closed the area on the left is about the same as last week. Certainly not any worse we have been using  silver alginate under an Unna boot 4/3; the area on the right medial ankle remains closed per the patient. We did not look at this wound. The wound on the left medial ankle is about the same surface looks healthy we have been using silver alginate under an Unna boot 4/10; area on the right medial ankle remains closed per the patient. We did not look at this wound. The wound on the left medial ankle is slightly larger. The patient complains that the [facility] caused burning pain all week. She also told us that she was a lot more active this week. Changed her back to silver alginate 4/17; right medial ankle still closed per the patient. Left medial ankle is slightly larger. Using silver alginate. She did not tolerate Hydrofera Blue on this area 4/24; right medial ankle remains closed we have not look at this. The left medial ankle continues to get larger today by about a centimeter. We have been using silver alginate under Unna boots. She complains about 4 layer compression as an alternative. She has been up on her feet working on her garden 5/8; right medial ankle remains closed we did not look at this. The left medial ankle has increased in size about 100%. We have been using silver alginate under Unna boots. She noted increased pain this week and was not surprised that the wound is deteriorated 5/15; no major change in SA however much less erythema ( one week of doxy ocellulitis). 5/22-64 year old female returns at 1 week to  the clinic for left medial ankle wound for which we have been using silver alginate under 3 layer compression She was placed on DOXY at last visit - the wound is wider at this visit. She is in 3 layer compression 5/29; change to [facility] last week. I had given her empiric doxycycline 2 weeks ago for a week. She is in 3 layer compression. She complains of a lot of pain and drainage on presentation today. 6/5; using Hydrofera Blue. I gave her doxycycline recently  empirically for erythema and pain around the wound. Believe her cultures showed enterococcus which not would not have been well covered by doxycycline nevertheless the wound looks better and I don't feel specifically that the enterococcus needs to be covered. She has a new what looks like a wrap injury on her lateral left ankle. 6/12; she is using Hydrofera Blue. She has a new area on the left anterior lower tibial area. This was a wrap injury last week. 6/19; the patient is using Hydrofera Blue. She arrived with marked inflammation and erythema around the wound and tenderness. 12/01/18 on evaluation today patient appears to be doing a little bit better based on what I'm hearing from the standpoint of lassos evaluation to this as far as the overall appearance of the wound is concerned. Then sometime substandard she typically sees Dr. Dellia Nims. Nonetheless overall very pleased with the progress that she's made up to this point. No fevers, chills, nausea, or vomiting noted at this time. 7/10; some improvement in the surface area. Aggressively debrided last week apparently. I went ahead with the debridement today although the patient does not tolerate this very well. We have been using Iodoflex. Still a fair amount of drainage 7/17; slightly smaller. Using Iodoflex. 7/24; no change from last week in terms of surface area. We have been using Iodoflex. Surface looks and continues to look somewhat better 7/31; surface area slightly smaller better looking surface. We have been using Iodoflex. This is under Unna boot compression 8/7-Patient presents at 1 week with Unna boot and Iodoflex, wound appears better 8/14-Patient presents at 1 week with Iodoflex, we use the Unna boot, wound appears to be stable better.Patient is getting Botox treatment for the inversion of the foot for tendon release, Next week 8/21; we are using Iodoflex. Unna boot. The wound is stable in terms of surface area. Under illumination  there is some areas of the wound that appear to be either epithelialized or perhaps this is adherent slough at this point I was not really clear. It did not wipe off and I was reluctant to debride this today. 8/28; we are using Iodoflex in an Unna boot. Seems to be making good improvement. 9/4; using Iodoflex and wound is slightly smaller. 9/18; we are using Iodoflex with topical silver nitrate when she is here. The wound continues to be smaller 10/2; patient missed her appointment last week due to GI issues. She left and Iodoflex based dressing on for 2 weeks. Wound is about the same size about the size of a dime on the left medial lower 10/9 we have been using Iodoflex on the medial left ankle wound. She has a new superficial probable wrap injury on the dorsal left ankle 10/16; we have been using Hydrofera Blue since last week. This is on the left medial ankle 10/23; we have been using Hydrofera Blue since 2 weeks ago. This is on the left medial ankle. Dimensions are better 11/6; using Hydrofera Blue. I think the wound is  smaller but still not closed. Left medial ankle 11/13; we have been using Hydrofera Blue. Wound is certainly no smaller this week. Also the surface not as good. This is the remanent of a very large area on her left medial ankle. 11/20; using Sorbact since last week. Wound was about the same in terms of size although I was disappointed about the surface debris 12/11; 3-week follow-up. Patient was on vacation. Wound is measuring slightly larger we have been using Sorbact. 12/18; wound is about the same size however surface looks better last week after debridement. We have been using Sorbact under compression 1/15 wound is probably twice the size of last time increased in length nonviable surface. We have been using Sorbact. She was running a mild fever and missed her appointment last week 1/22; the wound is come down in size but under illumination still a very adherent debris  we have been Hydrofera Blue that I changed her to last week 1/29; dimensions down slightly. We have been using Hydrofera Blue Objective Constitutional Wide pulse pressure but appears healthy. Pulse regular and within target range for patient.. Temperature is normal and within the target range for the patient.Marland Kitchen Appears in no distress. Vitals Time Taken: 10:12 AM, Height: 68 in, Source: Stated, Weight: 127 lbs, Source: Stated, BMI: 19.3, Temperature: 98.1 F, Pulse: 61 bpm, Respiratory Rate: 18 breaths/min, Blood Pressure: 114/48 mmHg. Respiratory work of breathing is normal. Cardiovascular Pedal pulses are palpable. General Notes: Wound exam; left medial malleolus under illumination the surface is not too bad. I elected not to debride this today. As long as this continues to contract I think we can spare her an aggressive mechanical debridement and I told her this. There is no evidence of surrounding infection Integumentary (Hair, Skin) There is no erythema around the wound no evidence of infection. Wound #3 status is Open. Original cause of wound was Gradually Appeared. The wound is located on the Left,Medial Malleolus. The wound measures 2.2cm length x 1.1cm width x 0.1cm depth; 1.901cm^2 area and 0.19cm^3 volume. There is Fat Layer (Subcutaneous Tissue) Exposed exposed. There is no tunneling or undermining noted. There is a medium amount of serosanguineous drainage noted. The wound margin is distinct with the outline attached to the wound base. There is large (67-100%) red, pink granulation within the wound bed. There is a small (1-33%) amount of necrotic tissue within the wound bed including Adherent Slough. 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 There was a Haematologist Compression Therapy Procedure by Deon Pilling, RN. Post procedure Diagnosis Wound #3: Same as Pre-Procedure Plan Follow-up Appointments: Return Appointment in 1 week. - Friday Dressing Change Frequency: Wound #3 Left,Medial Malleolus: Do not change entire dressing for one week. - change dressing in one week Skin Barriers/Peri-Wound Care: Moisturizing lotion TCA Cream or Ointment - mixed with lotion Wound Cleansing: Clean wound with Wound Cleanser - clean with anasept with dressing changes May shower with protection. Primary Wound Dressing: Wound #3 Left,Medial Malleolus: Hydrofera Blue - classic Secondary Dressing: Wound #3 Left,Medial Malleolus: Dry Gauze Edema Control: Unna Boot to Left Lower Extremity - no kerlix layer Avoid standing for long periods of time Elevate legs to the level of the heart or above for 30 minutes daily and/or when sitting, a frequency of: -  throughout the day Support Garment 20-30 mm/Hg pressure to: - compression stocking right leg daily #1. This is a chronic venous insufficiency wound. #2. The patient has severe venous hypertension not well manifested by edema. #3. We have to keep her in compression which limits the dressings on this. For now [facility] #4. Continue follow-up with the wound dimensions to determine whether further mechanical debridements are going to be necessary Electronic Signature(s) Signed: 07/01/2019 5:55:42 PM By: Linton Ham MD Entered By: Linton Ham on 07/01/2019 11:27:16 -------------------------------------------------------------------------------- SuperBill Details Patient Name: Date of Service: [name] 07/01/2019 Medical Record [id number] Patient Account Number: [id number] Date of Birth/Sex: Treating RN: 27-Apr-1956 (64 y.o. 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

## 2019-07-08 ENCOUNTER — Other Ambulatory Visit: Payer: Self-pay

## 2019-07-08 ENCOUNTER — Encounter (HOSPITAL_BASED_OUTPATIENT_CLINIC_OR_DEPARTMENT_OTHER): Payer: Medicare Other | Attending: Internal Medicine | Admitting: Internal Medicine

## 2019-07-08 DIAGNOSIS — I771 Stricture of artery: Secondary | ICD-10-CM | POA: Diagnosis not present

## 2019-07-08 DIAGNOSIS — L97322 Non-pressure chronic ulcer of left ankle with fat layer exposed: Secondary | ICD-10-CM | POA: Insufficient documentation

## 2019-07-08 DIAGNOSIS — I83212 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 and inflammation: Secondary | ICD-10-CM | POA: Diagnosis not present

## 2019-07-08 DIAGNOSIS — G8929 Other chronic pain: Secondary | ICD-10-CM | POA: Insufficient documentation

## 2019-07-08 NOTE — Progress Notes (Signed)
SIERRA, HINZ ([id number]) Visit Report for 07/08/2019 Arrival Information Details Patient Name: Date of Service: [name], [name] 07/08/2019 10:00 AM Medical Record [id number] Patient Account Number: [id number] Date of Birth/Sex: Treating RN: [birth year] (64 y.o. [name] Lochlann Mastrangelo: [name] Other Clinician: Referring Stefanos Haynesworth: Treating Lavenia Stumpo/Extender:Robson, Tawny Asal in Treatment: 343 Visit Information History Since Last Visit Cane Added or deleted any medications: No Patient Arrived: 10:06 Any new allergies or adverse reactions: No Arrival Time: Had a fall or experienced change in No Accompanied By: self None activities of daily living that may affect Transfer Assistance: risk of falls: Patient Identification Verified: Yes Signs or symptoms of abuse/neglect since last No Secondary Verification Process Completed: Yes visito Patient Requires Transmission-Based No Hospitalized since last visit: No Precautions: Implantable device outside of the clinic excluding No Patient Has Alerts: No cellular tissue based products placed in the center since last visit: Has Dressing in Place as Prescribed: Yes Has Compression in Place as Prescribed: Yes Pain Present Now: Yes Electronic Signature(s) Signed: 07/08/2019 4:26:03 PM By: Baruch Gouty RN, BSN Entered By: Baruch Gouty on 07/08/2019 10:08:35 -------------------------------------------------------------------------------- Compression Therapy Details Patient Name: Date of Service: [name] 07/08/2019 10:00 AM Medical Record [id number] Patient Account Number: [id number] Date of Birth/Sex: Treating RN: 1955-07-02 (64 y.o. [name] Lyllian Gause: [name] Other Clinician: Referring Katya Rolston: Treating Nealy Hickmon/Extender:Robson, Tawny Asal in Treatment: 343 Compression Therapy Performed for Wound Wound #3 Left,Medial  Malleolus Assessment: Performed By: Clinician Baruch Gouty, RN Compression Type: Rolena Infante Electronic Signature(s) Signed: 07/08/2019 4:26:03 PM By: Baruch Gouty RN, BSN Entered By: Baruch Gouty on 07/08/2019 10:28:30 -------------------------------------------------------------------------------- Encounter Discharge Information Details Patient Name: Date of Service: [name] 07/08/2019 10:00 AM Medical Record [id number] Patient Account Number: [id number] Date of Birth/Sex: Treating RN: 10-23-1955 (64 y.o. [name] Bryant Lipps: [name] Other Clinician: Referring Harlin Mazzoni: Treating Ressie Slevin/Extender:Robson, Tawny Asal in Treatment: [phone] Encounter Discharge Information Items Discharge Condition: Stable Ambulatory Status: Cane Discharge Destination: Home Transportation: Private Auto Accompanied By: self Schedule Follow-up Appointment: Yes Clinical Summary of Care: Patient Declined Electronic Signature(s) Signed: 07/08/2019 4:26:03 PM By: Baruch Gouty RN, BSN Entered By: Baruch Gouty on 07/08/2019 10:30:52 -------------------------------------------------------------------------------- Lower Extremity Assessment Details Patient Name: Date of Service: [name], [name] 07/08/2019 10:00 AM Medical Record [id number] Patient Account Number: [id number] Date of Birth/Sex: Treating RN: [birth year] (64 y.o. [name] Olamide Lahaie: [name] Other Clinician: Referring Nicandro Perrault: Treating Jarelis Ehlert/Extender:Robson, Velva Harman, Essie Christine in Treatment: 343 Edema Assessment Assessed: [Left: No] [Right: No] Edema: [Left: N] [Right: o] Calf Left: Right: Point of Measurement: 33 cm From Medial Instep 28 cm cm Ankle Left: Right: Point of Measurement: 12 cm From Medial Instep 20.5 cm cm Vascular Assessment Pulses: Dorsalis Pedis Palpable: [Left:Yes] Electronic Signature(s) Signed: 07/08/2019  4:26:03 PM By: Baruch Gouty RN, BSN Entered By: Baruch Gouty on 07/08/2019 10:11:31 -------------------------------------------------------------------------------- Pain Assessment Details Patient Name: Date of Service: [name] 07/08/2019 10:00 AM Medical Record [id number] Patient Account Number: [id number] Date of Birth/Sex: Treating RN: 1956/03/20 (64 y.o. [name] Lenardo Westwood: [name] Other Clinician: Referring Jayveion Stalling: Treating Haruto Demaria/Extender:Robson, Tawny Asal in Treatment: [phone] Active Problems Location of Pain Severity and Description of Pain Patient Has Paino Yes Site Locations Pain Location: Pain in Ulcers With Dressing Change: Yes Duration of the Pain. Constant / Intermittento Constant Rate the pain. Current Pain Level: 5 Worst Pain Level: 7 Least Pain Level: 4  Character of Pain Describe the Pain: Burning, Sharp, Shooting, Throbbing Pain Management and Medication Current Pain Management: Medication: Yes Is the Current Pain Management Adequate: Adequate Rest: Yes How does your wound impact your activities of daily livingo Sleep: No Bathing: No Appetite: No Relationship With Others: No Bladder Continence: No Emotions: No Bowel Continence: No Work: No Toileting: No Drive: No Dressing: No Hobbies: No Electronic Signature(s) Signed: 07/08/2019 4:26:03 PM By: Baruch Gouty RN, BSN Entered By: Baruch Gouty on 07/08/2019 10:11:04 -------------------------------------------------------------------------------- Patient/Caregiver Education Details Patient Name: Date of Service: [name], [name] G. 2/5/2021andnbsp10:00 AM Medical Record [id number] Patient Account Number: [id number] Date of Birth/Gender: Treating RN: 1955/12/14 (63 y.o. [name] Physician: [name] Other Clinician: Referring Physician: Treating Physician/Extender:Robson, Tawny Asal in Treatment: [phone] Education Assessment Education Provided To: Patient Education Topics Provided Venous: Methods: Explain/Verbal Responses: Reinforcements needed, State content correctly Wound/Skin Impairment: Methods: Explain/Verbal Responses: Reinforcements needed, State content correctly Electronic Signature(s) Signed: 07/08/2019 4:26:03 PM By: Baruch Gouty RN, BSN Entered By: Baruch Gouty on 07/08/2019 10:30:38 -------------------------------------------------------------------------------- Wound Assessment Details Patient Name: Date of Service: [name] 07/08/2019 10:00 AM Medical Record [id number] Patient Account Number: [id number] Date of Birth/Sex: Treating RN: [birth year] (64 y.o. F) Primary Care Madilynne Mullan: [name] Other Clinician: Referring Aimy Sweeting: Treating Digna Countess/Extender:Robson, Velva Harman, Essie Christine in Treatment: 343 Wound Status Wound Number: 3 Primary Venous Leg Ulcer Etiology: Wound Location: Left Malleolus - Medial Wound Open Wounding Event: Gradually Appeared Status: Date Acquired: 11/15/2012 Comorbid Congestive Heart Failure, Peripheral Vascular Weeks Of Treatment: 343 History: Disease, Congestive Heart Failure, End Clustered Wound: No Stage Renal Disease, Tobacco Use, Chronic Obstructive Pulmonary Disease (COPD), Type 1 Diabetes Photos Wound Measurements Length: (cm) 2 % Reduct Width: (cm) 1 % Reduct Depth: (cm) 0.1 Epitheli Area: (cm) 1.571 Tunneli Volume: (cm) 0.157 Undermi Wound Description Classification: Full Thickness Without Exposed Support Foul Od Structures Slough/ Wound Distinct, outline attached Margin: Exudate Medium Amount: Exudate Serosanguineous Type: Exudate red, brown Color: Wound Bed Granulation Amount: Large (67-100%) Granulation Quality: Red, Pink Fascia E Necrotic Amount: Small (1-33%) Fat Laye Necrotic Quality: Adherent Slough Tendon E Muscle E Joint Ex Bone  Exp or After Cleansing: No Fibrino Yes Exposed Structure xposed: No r (Subcutaneous Tissue) Exposed: Yes xposed: No xposed: No posed: No osed: No ion in Area: 50% ion in Volume: 75% alization: Small (1-33%) ng: No ning: No Treatment Notes Wound #3 (Left, Medial Malleolus) 2. Periwound Care Barrier cream Moisturizing lotion TCA Cream 3. Primary Dressing Applied Hydrofera Blue 4. Secondary Dressing Dry Gauze Kerramax/Xtrasorb 6. Support Layer Production assistant, radio Notes no kerlix in [employer]. hydrofera classic. foam to lateral side of foot for protection. Electronic Signature(s) Signed: 07/08/2019 4:37:46 PM By: Mikeal Hawthorne EMT/HBOT Entered By: Mikeal Hawthorne on 07/08/2019 15:38:08 -------------------------------------------------------------------------------- Vitals Details Patient Name: Date of Service: [name] 07/08/2019 10:00 AM Medical Record [id number] Patient Account Number: [id number] Date of Birth/Sex: Treating RN: June 19, 1955 (64 y.o. [name] Seeley Hissong: [name] Other Clinician: Referring Maythe Deramo: Treating Shawnae Leiva/Extender:Robson, Tawny Asal in Treatment: 343 Vital Signs Time Taken: 10:08 Temperature (F): 97.5 Height (in): 68 Pulse (bpm): 59 Source: Stated Respiratory Rate (breaths/min): 18 Weight (lbs): 127 Blood Pressure (mmHg): 120/43 Source: Stated Reference Range: 80 - 120 mg / dl Body Mass Index (BMI): 19.3 Electronic Signature(s) Signed: 07/08/2019 4:26:03 PM By: Baruch Gouty RN, BSN Entered By: Baruch Gouty on 07/08/2019 10:09:17

## 2019-07-08 NOTE — Progress Notes (Signed)
NECOL, SYRETT ([id number]) Visit Report for 07/08/2019 SuperBill Details Patient Name: Date of Service: [name], [name] 07/08/2019 Medical Record [id number] Patient Account Number: [id number] Date of Birth/Sex: Treating RN: 18-Aug-1955 (64 y.o. 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

## 2019-07-15 ENCOUNTER — Encounter (HOSPITAL_BASED_OUTPATIENT_CLINIC_OR_DEPARTMENT_OTHER): Payer: Medicare Other | Admitting: Internal Medicine

## 2019-07-22 ENCOUNTER — Other Ambulatory Visit: Payer: Self-pay

## 2019-07-22 ENCOUNTER — Encounter (HOSPITAL_BASED_OUTPATIENT_CLINIC_OR_DEPARTMENT_OTHER): Payer: Medicare Other | Admitting: Internal Medicine

## 2019-07-22 DIAGNOSIS — L97322 Non-pressure chronic ulcer of left ankle with fat layer exposed: Secondary | ICD-10-CM | POA: Diagnosis not present

## 2019-07-25 NOTE — Progress Notes (Signed)
SIYANNA, POZNANSKI ([id number]) Visit Report for 07/22/2019 HPI Details Patient Name: Date of Service: DONDREA, CAVINESS 07/22/2019 10:15 AM Medical Record [id number] Patient Account Number: [id number] Date of Birth/Sex: Treating RN: 1955-06-13 (65 y.o. [name] Primary Care Provider: [name] Other Clinician: Referring Provider: Treating Provider/Extender:Weiland Tomich, Tawny Asal in Treatment: 345 History of Present Illness HPI Description: the remaining wound is over the left medial ankle. Similar wound over the right medial ankle healed largely with use of Apligraf. Most recently we have been using Hydrofera Blue over this wound with considerable improvement. The patient has been extensively worked up in the past for her venous insufficiency and she is not a candidate for antireflux surgery although I have none of the details available currently. 08/24/14; considerable improvement today. About 50% of this wound areas now epithelialized. The base of the wound appears to be healthier granulation.as opposed to last week when she had deteriorated a considerable improvement 08/17/14; unfortunately the wound has regressed somewhat. The areas of epithelialization from the superior aspect are not nearly as healthy as they were last week. The patient thinks her Hydrofera Blue slipped. 09/07/14; unfortunately the area has markedly regressed in the 2 weeks since I've seen this. There is an odor surrounding erythema. The healthy granulation tissue that we had at the base of the wound now is a dusky color. The nurse reports green drainage 09/14/14; the area looks somewhat better than last week. There is less erythema and less drainage. The culture I did did not show any growth. Nevertheless I think it is better to continue the Cipro and doxycycline for a further week. The remaining wound area was debridement. 09/21/14. Wound did not require debridement last week. Still  less erythema and less drainage. She can complete her antibiotics. The areas of epithelialization in the superior aspect of the wound do not look as healthy as they did some weeks ago 10/05/14 continued improvement in the condition of this wound. There is advancing epithelialization. Less aggressive debridement required 10/19/14 continued improvement in the condition and volume of this wound. Less aggressive debridement to the inferior part of this to remove surface slough and fibrinous eschar 11/02/14 no debridement is required. The surface granulation appears healthy although some of her islands of epithelialization seem to have regressed. No evidence of infection 11/16/14; lites surface debridement done of surface eschar. The wound does not look to be unhealthy. No evidence of infection. Unfortunately the patient has had podiatry issues in the right foot and for some reason has redeveloped small surface ulcerations in the medial right ankle. Her original presentation involved wounds in this area 11/23/14 no debridement. The area on the right ankle has enlarged. The left ankle wound appears stable in terms of the surface although there is periwound inflammation. There has been regression in the amount of new skin 11/30/14 no debridement. Both wound areas appear healthy. There was no evidence of infection. The the new area on the right medial ankle has enlarged although that both the surfaces appear to be stable. 12/07/14; Debridement of the right medial ankle wound. No no debridement was done on the left. 12/14/14 no major change in and now bilateral medial ankle wounds. Both of these are very painful but the no overt evidence of infection. She has had previous venous ablation 12/21/14; patient states that her right medial ankle wound is considerably more painful last week than usual. Her left is also somewhat painful. She could not tolerate debridement. The right medial ankle  wound has fibrinous  surface eschar 12/28/14 this is a patient with severe bilateral venous insufficiency ulcers. For a considerable period of time we actually had the one on the right medial ankle healed however this recently opened up again in June. The left medial ankle wound has been a refractory area with some absent flows. We had some success with Hydrofera Blue on this area and it literally closed by 50% however it is recently opened up Foley. Both of these were debridement today of surface eschar. She tolerates this poorly 01/25/15: No change in the status of this. Thick adherent escar. Very poor tolerance of any attempt at debridement. I had healed the right medial malleolus wound for a considerable amount of time and had the left one down to about 50% of the volume although this is totally regressed over the last 48 weeks. Further the right leg has reopened. she is trying to make a appointment with pain and vascular, previous ablations with Dr. Aleda Grana. I do not believe there is an arterial insufficiency issue here 02/01/15 the status of the adherent eschar bilaterally is actually improved. No debridement was done. She did not manage to get vascular studies done 02/08/15 continued debridement of the area was done today. The slough is less adherent and comes off with less pressure. There is no surrounding infection peripheral pulses are intact 02/15/15 selective debridement with a disposable curette. Again the slough is less adherent and comes off with less difficulty. No surrounding infection peripheral pulses are intact. 02/22/15 selective debridement of the right medial ankle wound. Slough comes off with less difficulty. No obvious surrounding infection peripheral pulses are intact I did not debridement the one on the left. Both of these are stable to improved 03/01/15 selective debridement of both wound areas using a curette to. Adherent slough cup soft with less difficulty. No obvious surrounding  infection. The patient tells me that 2 days ago she noted a rash above the right leg wrap. She did not have this on her lower legs when she change this over she arrives with widespread left greater than right almost folliculitis-looking rash which is extremely pruritic. I don't see anything to culture here. There is no rash on the rest of her body. She feels well systemically. 03/08/15; selective debridement of both wounds using a curette. Base of this does not look unhealthy. She had limegreen drainage coming out of the left leg wound and describes a lot of drainage. The rash on her left leg looks improved to. No cultures were done. 03/22/15; patient was not here last week. Basal wounds does not look healthy and there is no surrounding erythema. No drainage. There is still a rash on the left leg that almost looks vasculitic however it is clearly limited to the top of where the wrap would be. 04/05/15; on the right required a surgical debridement of surface eschar and necrotic subcutaneous tissue. I did not debridement the area on the left. These continue to be large open wounds that are not changing that much. We were successful at one point in healing the area on the right, and at the same time the area on the left was roughly half the size of current measurements. I think a lot of the deterioration has to do with the prolonged time the patient is on her feet at work 04/19/15 I attempted-like surface debridement bilaterally she does not tolerate this. She tells me that she was in allergic care yesterday with extreme pain over her left lateral  malleolus/ankle and was told that she has an "sprain" 05/03/15; large bilateral venous insufficiency wounds over the medial malleolus/medial aspect of her ankles. She complains of copious amounts of drainage and his usual large amounts of pain. There is some increasing erythema around the wound on the right extending into the medial aspect of her foot to.  historically she came in with these wounds the right one healed and the left one came down to roughly half its current size however the right one is reopened and the left is expanded. 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 05/10/15 large bilateral venous insufficiency wounds. There is less adherence surface left however the surface culture that I did last week grew pseudomonas therefore bilateral selective debridement score necessary. There is surrounding erythema. The patient describes severe bilateral drainage and a lot of pain in the left ankle. Apparently her podiatrist was were ready to do a cortisone shot 05/17/15; the patient complains of pain and again copious amounts of drainage. 05/24/15; we used Iodo flex last week. Patient notes considerable improvement in wound drainage. Only needed to change this once. 05/31/15; we continued Iodoflex; the base of these large wounds bilaterally is not too bad but there is probably likely a significant bioburden here. I would like to debridement just doesn't tolerate it. 06/06/14 I would like to continue the Iodoflex although she still hasn't managed to obtain supplies. She has bilateral medial malleoli or large wounds which are mostly superficial. Both of them are covered circumferentially with some nonviable fibrinous slough although she tolerates debridement very poorly. She apparently has an appointment for an ablation on the right leg by interventional radiology. 06/14/15; the patient arrives with the wounds and static condition. We attempted a debridement although she does not do well with this secondary to pain. I 07/05/15; wounds are not much smaller however there appears to be a cleaner granulating base. The left has tight fibrinous slough greater than the right. Debridement is tolerated poorly due to pain. Iodoflex is done more for these wounds in any of the multitude of different dressings I  have tried on the left 1 and then subsequently the right. 07/12/15; no change in the condition of this wound. I am able to do an aggressive debridement on the right but not the left. She simply cannot tolerate it. We have been using Iodoflex which helps somewhat. It is worthwhile remembering that at one point we healed the right medial ankle wound and the left was about 25% of the current circumference. We have suggested returning to vascular surgery for review of possible further ablations for one reason or another she has not been able to do this. 07/26/15 no major change in the condition of either wound on her medial ankle. I did not attempt to debridement of these. She has been aggressively scrubbing these while she is in the shower at home. She has her supply of Iodoflex which seems to have done more for these wounds then anything I have put on recently. 08/09/15 wound area appears larger although not verified by measurements. Using Iodoflex 09/05/2015 -- she was here for avisit today but had significant problems with the wound and I was asked to see her for a physician opinion. I have summarize that this lady has had surgery on her left lower extremity about 10 years ago where the possible veins stripping was done. She has had an opinion from interventional radiology  around November 2016 where no further sclerotherapy was ordered. The patient works 12 hours a day and stands on a concrete floor with work boots and is unable to get the proper compression she requires and cannot elevate her limbs appropriately at any given time. She has recently grown Pseudomonas from her wound culture but has not started her ciprofloxacin which was called in for her. 09/13/15 this continues to be a difficult situation for this patient. At one point I had this wound down to a 1.5 x 1.5" wound on her left leg. This is deteriorated and the right leg has reopened. She now has substantial wounds on her medial calcaneus,  malleoli and into her lower leg. One on the left has surface eschar but these are far too painful for me to debridement here. She has a vascular surgery appointment next week to see if anything can be done to help here. I think she has had previous ablations several years ago at [facility]. She has no major edema. She tells me that she did not get product last time [facility] and went for several days without it. She continues to work in work boots 12 hours a day. She cannot get compression/4-layer under her work boots. 09/20/15 no major change. Periwound edema control was not very good. Her point with pain and vascular is next Wednesday the 25th 09/28/15; the patient is seen vascular surgery and is apparently scheduled for repeat duplex ultrasounds of her bilateral lower legs next week. 10/05/15; the patient was seen by Dr. Doren Custard of vascular surgery. He feels that she should have arterial insufficiency excluded as cause/contributed to her nonhealing stage she is therefore booked for an arteriogram. She has apparently monophasic signals in the dorsalis pedis pulses. She also of course has known severe chronic venous insufficiency with previous procedures as noted previously. I had another long discussion with the patient today about her continuing to work 12 hour shifts. I've written her out for 2 months area had concerns about this as her work location is currently undergoing significant turmoil and this may lead to her termination. She is aware of this however I agree with her that she simply cannot continue to stand for 12 hours multiple days a week with the substantial wound areas she has. 10/19/15; the Dr. Doren Custard appointment was largely for an arteriogram which was normal. She does not have an arterial issue. He didn't make a comment about her chronic venous insufficiency for which she has had previous ablations. Presumably it was not felt that anything additional could be done. The patient is  now out of work as I prescribed 2 weeks ago. Her wounds look somewhat less aggravated presumably because of this. I felt I would give debridement another try today 10/25/15; no major change in this patient's wounds. We are struggling to get her product that she can afford into her own home through her insurance. 11/01/15; no major change in the patient's wounds. I have been using silver alginate as the most affordable product. I spoke to Dr. Marla Roe last week with her requested take her to the OR for surgical debridement and placement of ACEL. Dr. Marla Roe told me that she would be willing to do this however [facility] will not cover this, fortunately the patient has [place] healthcare of some variant 11/08/15; no major change in the patient's wounds. She has been completely nonviable surface that this but is in too much pain with any attempted debridement are clinic. I have  arranged for her to see Dr. Marla Roe ham of plastic surgery and this appointment is on Monday. I am hopeful that they will take her to the OR for debridement, possible ACEL ultimately possible skin graft 11/22/15 no major change in the patient's wounds over her bilateral medial calcaneus medial malleolus into the lower legs. Surface on these does not look too bad however on the left there is surrounding erythema and tenderness. This may be cellulitis or could him sleepy tinea. 11/29/15; no major changes in the patient's wounds over her bilateral medial malleolus. There is no infection here and I don't think any additional antibiotics are necessary. There is now plan to move forward. She sees Dr. Marla Roe in a week's time for preparation for operative debridement and ACEL placement I believe on 7/12. She then has a follow-up appointment with Dr. Marla Roe on 7/21 12/28/15; the patient returns today having been taken to the [place] by Dr. Marla Roe 12/12/15 she underwent debridement, intraoperative cultures [which were  negative]. She had placement of a wound VAC. Parent really ACEL was not available to be placed. The wound VAC foam apparently adhered to the wound since then she's been using silver alginate, Xeroform under Ace wraps. She still says there is a lot of drainage and a lot of pain 01/31/16; this is a patient I see monthly. I had referred her to Dr. Marla Roe him of plastic surgery for large wounds on her bilateral medial ankles. She has been to the OR twice once in early July and once in early August. She tells me over the last 3 weeks she has been using the wound VAC with ACEL underneath it. On the right we've simply been using silver alginate. Under Kerlix Coban wraps. 02/28/16; this is a patient I'm currently seeing monthly. She is gone on to have a skin graft over her large venous insufficiency ulcer on the left medial ankle. This was done by Dr. Marla Roe him. The patient is a bit perturbed about why she didn't have one on her right medial ankle wound. She has been using silver alginate to this. 03/06/16; I received a phone call from her plastic surgery Dr. Marla Roe. She expressed some concern about the viability of the skin graft she did on the left medial ankle wound. Asked me to place Endoform on this. She told me she is not planning to do a subsequent skin graft on the right as the left one did not take very well. I had placed Hydrofera Blue on the right 03/13/16; continue to have a reasonably healthy wound on the right medial ankle. Down to 3 mm in terms of size. There is epithelialization here. The area on the left medial ankle is her skin graft site. I suppose the last week this looks somewhat better. She has an open area inferiorly however in the center there appears to be some viable tissue. There is a lot of surface callus and eschar that will eventually need to come off however none of this looked to be infected. Patient states that the is able to keep the dressing on for several days  which is an improvement. 03/20/16 no major change in the circumference of either wound however on the left side the patient was at Dr. Eusebio Friendly office and they did a debridement of left wound. 50% of the wound seems to be epithelialized. I been using Endoform on the left Hydrofera Blue in the right 03/27/16; she arrives today with her wound is not looking as healthy as they did  last week. The area on the right clearly has an adherent surface to this a very similar surface on the left. Unfortunately for this patient this is all too familiar problem. Clearly the Endoform is not working and will need to change that today that has some potential to help this surface. She does not tolerate debridement in this clinic very well. She is changing the dressing wants 04/03/16; patient arrives with the wounds looking somewhat better especially on the right. Dr. Migdalia Dk change the dressing to silver alginate when she saw her on Monday and also sold her some compression socks. The usefulness of the latter is really not clear and woman with severely draining wounds. 04/10/16; the patient is doing a bit of an experiment wearing the compression stockings that Dr. Migdalia Dk provided her to her left leg and the out of legs based dressings that we provided to the right. 05/01/16; the patient is continuing to wear compression stockings Dr. Migdalia Dk provided her on the left that are apparently silver impregnated. She has been using Iodoflex to the right leg wound. Still a moderate amount of drainage, when she leaves here the wraps only last for 4 days. She has to change the stocking on the left leg every night 05/15/16; she is now using compression stockings bilaterally provided by Dr. Marla Roe. She is wearing a nonadherent layer over the wounds so really I don't think there is anything specific being done to this now. She has some reduction on the left wound. The right is stable. I think all healing here is being done  without a specific dressing 06/09/16; patient arrives here today with not much change in the wound certainly in diameter to large circular wounds over the medial aspect of her ankle bilaterally. Under the light of these services are certainly not viable for healing. There is no evidence of surrounding infection. She is wearing compression stockings with some sort of silver impregnation as prescribed by Dr. Marla Roe. She has a follow-up with her tomorrow. 06/30/16; no major change in the size or condition of her wounds. These are still probably covered with a nonviable surface. She is using only her purchase stockings. She did see Dr. Marla Roe who seemed to want to apply Dakin's solution to this I'm not extreme short what value this would be. I would suggest Iodoflex which she still has at home. 07/28/16; I follow Mrs. Guess episodically along with Dr. Marla Roe. 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 She has been applying a topical collagen ointment to both wounds with Adaptic. I don't think Dr. Marla Roe is planning to take her back to the OR. 08/19/16; I follow Mrs. Jeneen Rinks on a monthly basis along with Dr. Marla Roe of plastic surgery. 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 I been following her for a number of years. At one point I was able to get the right medial malleolus wound to heal and had the left medial malleolus down to about half its current size however and I had to send her to plastic surgery for an operative debridement. Since then things have been stable to slightly improve the area on the right is slightly better one in the left about the same although there is much less adherent surface than I'm used to with this patient. She is using some form of liquid collagen gel that Dr. Marla Roe provided a Kerlix cover with the patient's own  pressure stockings. She tells me that she has extreme pain in both ankles and along the lateral aspect of both feet. She has been unable to work for some period of time. She is telling me she is retiring at the beginning of April. She sees Dr. Doran Durand of orthopedics next week 09/22/16; patient has not seen Dr. Marla Roe since the last time she is here. I'm not really sure what she is using to the wounds other than bits and pieces of think she had left over including most recently Hydrofera Blue. She is using juxtalite stockings. She is having difficulty with her husband's recent illness "stroke". She is having to transport him to various doctors appointments. Dr. Marla Roe left her the option of a repeat debridement with ACEL however she has not been able to get the time to follow-up on this. She continues to have a fair amount of drainage out of these wounds with certainly precludes leaving dressings on all week 10/13/16; patient has not seen Dr. Marla Roe since she was last in our clinic. I'm not really sure what she is doing with the wounds, we did try to get her [facility] and I think she is actually using this most of the time. Because of drainage she states she has to change this every second day although this is an improvement from what she used to do. She went to see Dr. Doran Durand who did not think she had a muscular issue with regards to her feet, he referred her to a neurologist and I think the appointment is sometime in June. I changed her back to Iodoflex which she has used in the past but not recently. 11/03/16; the patient has been using Iodoflex although she ran out of this. Still claims that there is a lot of drainage although the wound does not look like this. No surrounding erythema. She has not been back to see Dr. Marla Roe 11/24/16; the patient has been using Iodoflex again but she ran out of it 2 or 3 days ago. There is no major change in the condition of either one of these  wounds in fact they are larger and covered in a thick adherent surface slough/nonviable tissue especially on the left. She does not tolerate mechanical debridement in our clinic. Going back to see Dr. Marla Roe of plastic surgery for an operative debridement would seem reasonable. 12/15/16; the patient has not been back to see Dr. Marla Roe. She is been dealing with a series of illnesses and her husband which of monopolized her time. She is been using Sorbact which we largely supplied. She states the drainage is bad enough that it maximum she can go 2-3 days without changing the dressing 01/12/2017 -- the patient has not been back for about 4 weeks and has not seen Dr. Marla Roe not does she have any appointment pending. 01/23/17; patient has not seen Dr. Marla Roe even though I suggested this previously. She is using Santyl that was suggested last week by Dr. Con Memos this Cost her $16 through her insurance which is indeed surprising 02/12/17; continuing Santyl and the patient is changing this daily. A lot of drainage. She has not been back to see plastic surgery she is using an Ace wrap. Our intake nurse suggested wrap around stockings which would make a good reasonable alternative 02/26/17; patient is been using Santyl and changing this daily due to drainage. She has not been to see plastic surgery she uses in April Ace wrap to control the edema. She did obtain extremitease stockings  but stated that the edema in her leg was to big for these 03/20/17; patient is using Santyl and Anasept. Surfaces looked better today the area on the right is actually measuring a little smaller. She has states she has a lot of pain in her feet and ankles and is asking for a consult to pain control which I'll try to help her with through our case manager. 04/10/17; the patient arrives with better-looking wound surfaces and is slightly smaller wound on the left she is using a combination of Santyl and Anasept. She has  an appointment or at least as started in the pain control center associated with [facility] 05/14/17; this is a patient who I followed for a prolonged period of time. 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 At one point I had this down to a much smaller wound on the left however these reopened and we've never been able to get these to heal. She has been using Santyl and Anasept gel although 2 weeks ago she ran out of the Anasept gel. She has a stable appearance of the wound. She is going to the wound care clinic at [facility]. They wanted do a nerve block/spinal block although she tells me she is reluctant to go forward with that. 05/21/17; this is a patient I have followed for many years. 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 Chronic pain and deformity in her ankles as well. She is been to see plastic surgery as well as orthopedics. Using PolyMem AG most recently/Kerramax/ABDs and 2 layer compression. She has managed to keep this on and she is coming in for a nurse check to change the dressing on Tuesdays, we see her on Fridays 06/05/17; really quite a good looking surface and the area especially on the right medial has contracted in terms of dimensions. Well granulated healthy-looking tissue on both sides. Even with an open curet there is nothing that even feels abnormal here. This is as good as I've seen this in quite some time. We have been using PolyMem AG and bringing her in for a nurse check 06/12/17; really quite good surface on both of these wounds. The right medial has contracted a bit left is not. We've been using PolyMem and AG and she is coming in for a nurse visit 06/19/17; we have been using PolyMem AG and bringing her in for a nurse check. Dimensions of her wounds are not better but the surfaces looked better bilaterally. She complained of bleeding last night and the left wound and increasing pain bilaterally. She  states her wound pain is more neuropathic than just the wounds. There was some suggestion that this was radicular from her pain management doctor in talking to her it is really difficult to sort this out. 06/26/17; using PolyMem and AG and bringing her in for a nurse check as All of this and reasonably stable condition. Certainly not improved. The dimensions on the lateral part of the right leg look better but not really measuring better. The medial aspect on the left is about the same. 07/03/16; we have been using PolyMen AG and bringing her in for a nurse check to change the dressings as the wounds have drainage which precludes once weekly changing. We are using all secondary absorptive dressings.our intake nurse is brought up the idea of using a wound VAC/snap VAC on the wound to help with the drainage to see if this would result in some contraction. This is  not a bad idea. The area on the right medial is actually looking smaller. Both wounds have a reasonable-looking surface. There is no evidence of cellulitis. The edema is well controlled 07/10/17; the patient was denied for a snap VAC by her insurance. The major issue with these wounds continues to be drainage. We are using wicked PolyMem AG and she is coming in for a nurse visit to change this. The wounds are stable to slightly improved. The surface looks vibrant and the area on the right certainly has shrunk in size but very slowly 07/17/17; the patient still has large wounds on her bilateral medial malleoli. Surface of both of these wounds looks better. The dimensions seem to come and go but no consistent improvement. There is no epithelialization. We do not have options for advanced treatment products due to insurance issues. They did not approve of the wound VAC to help control the drainage. More recently we've been using PolyMem and AG wicked to allow drainage through. We have been bringing her in for a nurse visit to change this. We do not  have a lot of options for wound care products and the home again due to insurance issues 07/24/17; the patient's wound actually looks somewhat better today. No drainage measurements are smaller still healthy-looking surface. We used silver collagen under PolyMen started last week. We have been bringing her in for a dressing change 07/31/17; patient's wound surface continued to look better and I think there is visible change in the dimensions of the wound on the right. Rims of epithelialization. We have been using silver collagen under PolyMen and bringing her in for a dressing change. There appears to be less drainage although she is still in need of the dressing change 08/07/17. Patient's wound surface continues to look better on both sides and the area on the right is definitely smaller. We have been using silver collagen and PolyMen. She feels that the drainage has been it has been better. I asked her about her vascular status. She went to see Dr. Aleda Grana at [facility] and had some form of ablation. I don't have much detail on this. I haven't my notes from 2016 that she was not a candidate for any further ablation but I don't have any more information on this. We had referred her to vein and vascular I don't think she ever went. He does not have a history of PAD although I don't have any information on this either. We don't even have ABIs in our record 08/14/17; we've been using silver collagen and PolyMen cover. And putting the patient and compression. She we are bringing her in as a nurse visit to change this because ofarge amount of drainage. We didn't the ABIs in clinic today since they had been done in many moons 1.2 bilaterally. She has been to see vein and vascular however this was at [facility] and she had ablation although I really don't have any information on this all seemed biking get a report. She is also been operatively debrided by plastic surgery and had a cell placed  probably 8-12 months ago. This didn't have a major effect. We've been making some gains with current dressings 08/19/17-She is here in follow-up evaluation for bilateral medial malleoli ulcers. She continues to tolerate debridement very poorly. We will continue with recently changed topical treatment; if no significant improvement may consider switching to Iodosorb/Iodoflex. 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 She has been using silver  collagen and PolyMem. I believe she has been used and tried on Iodoflex before. 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 These have been very refractory over time. We have been using silver collagen and PolyMen. She is coming in once a week for a doctor's and once a week for nurse visits. We are actually making some progress 09/18/17; the patient's wounds are smaller especially on the right medial. She arrives today to upset to consider even washing these off with Anasept which I think is been part of the reason this is been closing. We've been using collagen covered in PolyMen otherwise. It is noted that she has a small area of folliculitis on the right medial calf that. As we are wrapping her legs I'll give her a short course of doxycycline to make sure this doesn't amount to anything. She is a long list of complaints today including imbalance, shortness of breath on exertion, inversion of her left ankle. With regards to the latter complaints she is been to see orthopedics and they offered her a tendon release surgery I believe but wanted her wounds to be closed first. I have recommended she go see her primary physician with regards to everything else. 09/25/17; patient's wounds are about the same size. We have made  some progress bilaterally although not in recent weeks. She will not allow me T wash these wounds with Anasept even if she is doing her cell. Wheeze we've been using collagen covered in PolyMen. Last week she had a small area of folliculitis this is now opened into a small wound. She completed 5 days of trimethoprim sulfamethoxazole 10/02/17; unfortunately the area on her left medial ankle is worse with a larger wound area towards the Achilles. The patient complains of a lot of pain. She will not allow debridement although visually I don't think there is anything to debridement in any case. We have been using silver collagen and PolyMen for several months now. Initially we are making some progress although I'm not really seeing that today. We will move back to [facility]. His admittedly this is a bit of a repeat however I'm hoping that his situation is different now. The patient tells me she had her leg on the left give out on her yesterday this is process some pain. 10/09/17; the patient is seen twice a week largely because of drainage issues coming out of the chronic medial bimalleolar wounds that are chronic. Last week the dimensions of the one on the left looks a little larger I changed her to [facility]. She comes in today with a history of terrible pain in the bilateral wound areas. She will not allow debridement. She will not even allow a tissue culture. There is no surrounding erythema no no evidence of cellulitis. We have been putting her Kerlix Coban man. She will not allow more aggressive compression as there was a suggestion to put her in 3 layer wraps. 10/16/17; large wounds on her bilateral medial malleoli. These are chronic. Not much change from last week. The surface looks have healthy but absolutely no epithelialization. A lot of pain little less so of drainage. She will not allow debridement or even washing these off in the vigorous fashion with Anasept. 10/23/17; large wounds  on her bilateral malleoli which are chronic. Some improvement in terms  of size perhaps on the right since last time I saw these. She states that after we increased the 3 layer compression there was some bleeding, when she came in for a nurse visit she did not want 3 layer compression put back on about our nurse managed to convince her. She has known chronic venous visit issues and I'm hoping to get her to tolerate the 3 layer compression. using Hydrofera Blue 10/30/17; absolutely no change in the condition of either wound although we've had some improvement in dimensions on the right.. Attempted to put her in 3 layer compression she didn't tolerated she is back in 2 layer compression. We've been using Hydrofera Blue We looked over her past records. She had venous reflux studies in November 2016. There was no evidence of deep venous reflux on the right. Superficial vein did not show the greater saphenous vein at think this is been previously ablated the small saphenous vein was within normal limits. The left deep venous system showed no DVT the vessels were positive for deep venous reflux in the posterior tibial veins at the ankle. The greater saphenous vein was surgically absent small saphenous vein was within normal limits. She went to vein and vascular at [facility]. I believe she had an ablation on the left greater saphenous vein. I'll update her reflux studies perhaps ever reviewed by vein and vascular. We've made absolutely no progress in these wounds. Will also try to read and TheraSkins through her insurance 11/06/17; W the patient apparently has a 2 week follow-up with vein and vascular I like him to review the whole issue with regards to her previous vascular workup by Dr. Aleda Grana. We've really made no progress on these wounds in many months. She arrives today with less viable looking surface on the left medial ankle wound. This was apparently looking about the same on Tuesday when  she was here for nurse visit. 11/13/17; deep tissue culture I did last time of the left lower leg showed multiple organisms without any predominating. In particular no Staphylococcus or group A strep were isolated. We sent her for venous reflux studies. She's had a previous left greater saphenous vein stripping and I think sclerotherapy of the right greater saphenous vein. She didn't really look at the lesser saphenous vein this both wounds are on the medial aspect. She has reflux in the common femoral vein and popliteal vein and an accessory vein on the right and the common femoral vein and popliteal vein on the left. I'm going to have her go to see vein and vascular just the look over things and see if anything else beside aggressive compression is indicated here. We have not been able to make any progress on these wounds in spite of the fact that the surface of the wounds is never look too bad. 11/20/17; no major change in the condition of the wounds. Patient reports a large amount of drainage. She has a lot of complaints of pain although enlisting her today I wonder if some of this at least his neuropathic rather than secondary to her wounds. She has an appointment with vein and vascular on 12/30/17. The refractory nature of these wounds in my mind at least need vein and vascular to look over the wounds the recent reflux studies we did and her history to see if anything further can be done here. I also note her gait is deteriorated quite a bit. 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 She has  a bilateral Trendelenburg gait. I wonder if this is neuropathic or perhaps multilevel radicular. 11/27/17; her wounds actually looks slightly better. Healthy-looking granulation tissue a scant amount of epithelialization. [place] healthcare will not pay for [employer]. They will play for tri layer Oasis and Dermagraft. This is not a diabetic ulcer. We'll try for the tri layer Oasis. She still  complains of some drainage. She has a vein and vascular appointment on 12/30/17 12/04/17; the wounds visually look quite good. Healthy-looking granulation with some degree of epithelialization. We are still waiting for response to our request for trial to try layer Oasis. Her appointment with vascular to review venous and arterial issues isn't sold the end of July 7/31. Not allow debridement or even vigorous cleansing of the wound surface. 12/18/17; slightly smaller especially on the right. 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 We've been using Hydrofera Blue. We still are looking into triple layer Oasis through her insurance 01/08/18 on evaluation today patient's wound actually appears to be showing signs of good improvement at this point in time. She has been tolerating the dressing changes without complication. Fortunately there does not appear to be any evidence of infection at this point in time. We have been utilizing silver nitrate which does seem to be of benefit for her which is also good news. Overall I'm very happy with how things seem to be both regards appearance as well as measurement. Patient did see Dr. Bridgett Larsson for evaluation on 12/30/17. In his assessment he felt that stripping would not likely add much more than chronic compression to the patient's healing process. His recommendation was to follow-up in three months with Dr. Doren Custard if she hasn't healed in order to consider referral back to you and see vascular where she previously was in a trial and was able to get her wound to heal. I'll be see what she feels she when you staying compression and he reiterated this as well. 01/13/18 on evaluation today patient appears to actually be doing very well in regard to her bilateral medial malleolus ulcers. She seems to have tolerated the chemical cauterization with silver nitrate last week she did have some pain through that evening but fortunately states that I'll  be see since it seems to be doing better she is overall pleased with the progress. 01/21/18; really quite a remarkable improvement since I've last seen these wounds. We started using silver nitrate specially on the islands of hyper granulation which for some reason her around the wound circumference. This is really done quite nicely. Primary dressing Hydrofera Blue under 4 layer compression. She seems to be able to hold out without a nurse rewrap. Follow-up in 1 week 01/28/18; we've continued the hydrofera blue but continued with chemical cauterization to the wound area that we started about a month ago for irregular hyper granulation. She is made almost stunning improvement in the overall wound dimensions. I was not really expecting this degree of improvement in these chronic wounds 02/05/18; we continue with Hydrofera Bluebut of also continued the aggressive chemical cauterization with silver nitrate. We made nice progress with the right greater than left wound. 02/12/18. We continued with Hydrofera Blue after aggressive chemical cauterization with silver nitrate. We appear to be making nice progress with both wound areas 02/19/2018; we continue with [facility] after washing the wounds vigorously with Anasept spray and chemical cauterization with silver nitrate. We are making excellent progress. The area on the right's just about closed 02/26/2018. The area on the left medial  ankle had too much necrotic debris today. I used a #5 curette we are able to get most of the soft. I continued with the silver nitrate to the much smaller wound on the right medial ankle she had a new area on her right lower pretibial area which she says was due to a role in her compression 03/05/2018; both wound areas look healthy. Not much change in dimensions from last week. I continue to use silver nitrate and Hydrofera Blue. The patient saw Dr. Doren Custard of vein and vascular. He felt she had venous stasis ulcers. He felt  based on her previous arteriogram she should have adequate circulation for healing. Also she has deep venous reflux but really no significant correctable superficial venous reflux at this time. He felt we should continue with conservative management including leg elevation and compression 04/02/2018; since we last saw this woman about a month ago she had a fall apparently suffered a pelvic fracture. I did not look up the x-ray. Nevertheless because of pain she literally was bedbound for 2 weeks and had home health coming out to change the dressing. Somewhat predictably this is resulted in considerable improvement in both wound areas. The right is just about closed on the medial malleolus and the left is about half the size. 04/16/2018; both her wounds continue to go down in size. Using Hydrofera Blue. 05/07/18; both her wounds appeared to be improving especially on the right where it is almost closed. We are using Hydrofera Blue 05/14/2018; slightly worse this week with larger wounds. Surface on the left medial not quite as good. We have been using Hydrofera Blue 05/21/18; again the wounds are slightly larger. Left medial malleolus slightly larger with eschar around the circumference. We have been using Hydrofera Blue undergoing a wraps for a prolonged period of time. This got a lot better when she was more recumbent due to a fall and a back injury. I change the primary dressing the silver alginate today. She did not tolerate a 4 layer compression previously although I may need to bring this up with her next time 05/28/2018; area on the left medial malleolus again is slightly larger with more drainage. Area on the right is roughly unchanged. She has a small area of folliculitis on the right medial just on the lower calf. This does not look ominous. 06/03/2018 left medial malleolus slightly smaller in a better looking surface. We used silver nitrate on this last time with silver alginate. The area  on the right appears slightly smaller 1/10; left medial malleolus slightly smaller. Small open area on the right. We used silver nitrate and silver alginate as of 2 weeks ago. We continue with the wound and compression. These got a lot better when she was off her feet 1/17; right medial malleolus wound is smaller. The left may be slightly smaller. Both surfaces look somewhat better. 1/24; both wounds are slightly smaller. Using silver alginate under Unna boots 1/31; both wounds appear smaller in fact the area on the right medial is just about closed. Surface eschar. We have been using silver alginate under Unna boots. The patient is less active now spends let much less time on her feet and I think this is contributed to the general improvement in the wound condition 2/7; both wounds appear smaller. I was hopeful the right medial would be closed however there there is still the same small open area. Slight amount of surface eschar on the left the dimensions are smaller there is eschar but  the wound edges appear to be free. We have been using silver alginate under Unna boot's 2/14; both wounds once again measure smaller. Circumferential eschar on the left medial. We have been using silver alginate under Unna boots with gradual improvement 2/21; the area on the right medial malleolus has healed. The area on the left is smaller. We have been using silver alginate and Unna boots. We can discharge wrapping the right leg she has 20/30 stockings at home she will need to protect the scar tissue in this area 2/28; the area on the right medial malleolus remains closed the patient has a compression stocking. The area on the left is smaller. We have been using silver alginate and Unna boots. 3/6 the area on the right medial ankle remains closed. Good edema control noted she is using her own compression stocking. The area on the left medial ankle is smaller. We have been managing this with silver alginate and  Unna boots which we will continue today. 3/13; the area on the right medial ankle remains closed and I'm declaring it healed today. When necessary the left is about the same still a healthy-looking surface but no major change and wound area. No evidence of infection and using silver alginate under unna and generally making considerable improvement 3/27 the area on the right medial ankle remains closed the area on the left is about the same as last week. Certainly not any worse we have been using silver alginate under an Unna boot 4/3; the area on the right medial ankle remains closed per the patient. We did not look at this wound. The wound on the left medial ankle is about the same surface looks healthy we have been using silver alginate under an Unna boot 4/10; area on the right medial ankle remains closed per the patient. We did not look at this wound. The wound on the left medial ankle is slightly larger. The patient complains that the [facility] caused burning pain all week. She also told us that she was a lot more active this week. Changed her back to silver alginate 4/17; right medial ankle still closed per the patient. Left medial ankle is slightly larger. Using silver alginate. She did not tolerate Hydrofera Blue on this area 4/24; right medial ankle remains closed we have not look at this. The left medial ankle continues to get larger today by about a centimeter. We have been using silver alginate under Unna boots. She complains about 4 layer compression as an alternative. She has been up on her feet working on her garden 5/8; right medial ankle remains closed we did not look at this. The left medial ankle has increased in size about 100%. We have been using silver alginate under Unna boots. She noted increased pain this week and was not surprised that the wound is deteriorated 5/15; no major change in SA however much less erythema ( one week of doxy ocellulitis). 5/22-64 year old  female returns at 1 week to the clinic for left medial ankle wound for which we have been using silver alginate under 3 layer compression She was placed on DOXY at last visit - the wound is wider at this visit. She is in 3 layer compression 5/29; change to [facility] last week. I had given her empiric doxycycline 2 weeks ago for a week. She is in 3 layer compression. She complains of a lot of pain and drainage on presentation today. 6/5; using Hydrofera Blue. I gave her doxycycline recently empirically for  erythema and pain around the wound. Believe her cultures showed enterococcus which not would not have been well covered by doxycycline nevertheless the wound looks better and I don't feel specifically that the enterococcus needs to be covered. She has a new what looks like a wrap injury on her lateral left ankle. 6/12; she is using Hydrofera Blue. She has a new area on the left anterior lower tibial area. This was a wrap injury last week. 6/19; the patient is using Hydrofera Blue. She arrived with marked inflammation and erythema around the wound and tenderness. 12/01/18 on evaluation today patient appears to be doing a little bit better based on what I'm hearing from the standpoint of lassos evaluation to this as far as the overall appearance of the wound is concerned. Then sometime substandard she typically sees Dr. Dellia Nims. Nonetheless overall very pleased with the progress that she's made up to this point. No fevers, chills, nausea, or vomiting noted at this time. 7/10; some improvement in the surface area. Aggressively debrided last week apparently. I went ahead with the debridement today although the patient does not tolerate this very well. We have been using Iodoflex. Still a fair amount of drainage 7/17; slightly smaller. Using Iodoflex. 7/24; no change from last week in terms of surface area. We have been using Iodoflex. Surface looks and continues to look somewhat better 7/31;  surface area slightly smaller better looking surface. We have been using Iodoflex. This is under Unna boot compression 8/7-Patient presents at 1 week with Unna boot and Iodoflex, wound appears better 8/14-Patient presents at 1 week with Iodoflex, we use the Unna boot, wound appears to be stable better.Patient is getting Botox treatment for the inversion of the foot for tendon release, Next week 8/21; we are using Iodoflex. Unna boot. The wound is stable in terms of surface area. Under illumination there is some areas of the wound that appear to be either epithelialized or perhaps this is adherent slough at this point I was not really clear. It did not wipe off and I was reluctant to debride this today. 8/28; we are using Iodoflex in an Unna boot. Seems to be making good improvement. 9/4; using Iodoflex and wound is slightly smaller. 9/18; we are using Iodoflex with topical silver nitrate when she is here. The wound continues to be smaller 10/2; patient missed her appointment last week due to GI issues. She left and Iodoflex based dressing on for 2 weeks. Wound is about the same size about the size of a dime on the left medial lower 10/9 we have been using Iodoflex on the medial left ankle wound. She has a new superficial probable wrap injury on the dorsal left ankle 10/16; we have been using Hydrofera Blue since last week. This is on the left medial ankle 10/23; we have been using Hydrofera Blue since 2 weeks ago. This is on the left medial ankle. Dimensions are better 11/6; using Hydrofera Blue. I think the wound is smaller but still not closed. Left medial ankle 11/13; we have been using Hydrofera Blue. Wound is certainly no smaller this week. Also the surface not as good. This is the remanent of a very large area on her left medial ankle. 11/20; using Sorbact since last week. Wound was about the same in terms of size although I was disappointed about the surface debris 12/11; 3-week  follow-up. Patient was on vacation. Wound is measuring slightly larger we have been using Sorbact. 12/18; wound is about the same size  however surface looks better last week after debridement. We have been using Sorbact under compression 1/15 wound is probably twice the size of last time increased in length nonviable surface. We have been using Sorbact. She was running a mild fever and missed her appointment last week 1/22; the wound is come down in size but under illumination still a very adherent debris we have been Hydrofera Blue that I changed her to last week 1/29; dimensions down slightly. We have been using Hydrofera Blue 2/19 dimensions are the same however there is rims of epithelialization under illumination. Therefore more the surface area may be epithelialized Electronic Signature(s) Signed: 07/22/2019 6:29:59 PM By: Linton Ham MD Entered By: Linton Ham on 07/22/2019 11:33:00 -------------------------------------------------------------------------------- Physical Exam Details Patient Name: Date of Service: CALAH, NEMEC 07/22/2019 10:15 AM Medical Record [id number] Patient Account Number: [id number] Date of Birth/Sex: Treating RN: [birth year] (64 y.o. [name] Primary Care Provider: [name] Other Clinician: Referring Provider: Treating Provider/Extender:Seleena Reimers, Tawny Asal in Treatment: 345 Constitutional Sitting or standing Blood Pressure is within target range for patient.. Pulse regular and within target range for patient.Marland Kitchen Respirations regular, non-labored and within target range.. Temperature is normal and within the target range for the patient.Marland Kitchen Appears in no distress. Cardiovascular Pedal pulses palpable. She has no major edema.. Notes Wound exam; left medial malleolus under illumination shows some rims of epithelialization. This may be an improvement. No real measurement in surface area though. The rims of  epithelialization themselves have rims of still open wound. No evidence of infection. No mechanical debridement was felt to be necessary Electronic Signature(s) Signed: 07/22/2019 6:29:59 PM By: Linton Ham MD Entered By: Linton Ham on 07/22/2019 11:35:41 -------------------------------------------------------------------------------- Physician Orders Details Patient Name: Date of Service: [name] 07/22/2019 10:15 AM Medical Record [id number] Patient Account Number: [id number] Date of Birth/Sex: Treating RN: Oct 20, 1955 (64 y.o. 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 - Friday Dressing Change Frequency Wound #3 Left,Medial Malleolus Do not change entire dressing for one week. - change dressing in one week Skin Barriers/Peri-Wound Care Moisturizing lotion TCA Cream or Ointment - mixed with lotion Wound Cleansing Clean wound with Wound Cleanser - clean with anasept with dressing changes May shower with protection. Primary Wound Dressing Wound #3 Left,Medial Malleolus Skin Substitute Application - Running IVR for Apligraft Hydrofera Blue - classic Secondary Dressing Wound #3 Left,Medial Malleolus Dry Gauze Edema Control Unna Boot to Left Lower Extremity - no kerlix layer Avoid standing for long periods of time Elevate legs to the level of the heart or above for 30 minutes daily and/or when sitting, a frequency of: - throughout the day Support Garment 20-30 mm/Hg pressure to: - compression stocking right leg daily Electronic  Signature(s) Signed: 07/22/2019 6:29:59 PM By: Linton Ham MD Signed: 07/25/2019 1:40:10 PM By: Kela Millin Entered By: Kela Millin on 07/22/2019 11:24:04 -------------------------------------------------------------------------------- Problem List Details Patient Name: Date of Service: [name] 07/22/2019 10:15 AM Medical Record [id number] Patient Account Number: [id number] Date of Birth/Sex: Treating RN: [birth year] (64 y.o. 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 11/16/2014 No Yes of calf and inflammation Inactive Problems ICD-10 Code Description Active Date Inactive Date I83.223 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 08/03/2014 08/03/2014 and inflammation L03.116 Cellulitis of left lower limb 09/07/2014 09/07/2014 L97.321 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 breakdown 03/11/2019 03/11/2019 of skin Resolved Problems ICD-10 Code Description Active Date Resolved Date L97.312 Non-pressure chronic ulcer of right ankle with fat layer 04/10/2016 04/10/2016 exposed Electronic Signature(s) Signed: 07/22/2019 6:29:59 PM By: Linton Ham MD Entered By: Linton Ham on 07/22/2019 11:30:30 -------------------------------------------------------------------------------- Progress Note Details Patient Name: Date of Service: [name] 07/22/2019 10:15 AM Medical Record [id number] Patient Account Number: [id number] Date of Birth/Sex: Treating RN: 1955/12/13 (64 y.o. [name] Primary Care Provider: [name] Other  Clinician: Referring Provider: Treating Provider/Extender:Vivan Vanderveer, Tawny Asal in Treatment: 345 Subjective History of Present Illness (HPI) the remaining wound is over the left medial ankle. Similar wound over the right medial ankle healed largely with use of Apligraf. Most recently we have been using Hydrofera Blue over this wound with considerable improvement. The patient has been extensively worked up in the past for her venous insufficiency and she is not a candidate for antireflux surgery although I have none of the details available currently. 08/24/14; considerable improvement today. About 50% of this wound areas now epithelialized. The base of the wound appears to be healthier granulation.as opposed to last week when she had deteriorated a considerable improvement 08/17/14; unfortunately the wound has regressed somewhat. The areas of epithelialization from the superior aspect are not nearly as healthy as they were last week. The patient thinks her Hydrofera Blue slipped. 09/07/14; unfortunately the area has markedly regressed in the 2 weeks since I've seen this. There is an odor surrounding erythema. The healthy granulation tissue that we had at the base of the wound now is a dusky color. The nurse reports green drainage 09/14/14; the area looks somewhat better than last week. There is less erythema and less drainage. The culture I did did not show any growth. Nevertheless I think it is better to continue the Cipro and doxycycline for a further week. The remaining wound area was debridement. 09/21/14. Wound did not require debridement last week. Still less erythema and less drainage. She can complete her antibiotics. The areas of epithelialization in the superior aspect of the wound do not look as healthy as they did some weeks ago 10/05/14 continued improvement in the condition of this wound. There is advancing epithelialization. Less aggressive debridement  required 10/19/14 continued improvement in the condition and volume of this wound. Less aggressive debridement to the inferior part of this to remove surface slough and fibrinous eschar 11/02/14 no debridement is required. The surface granulation appears healthy although some of her islands of epithelialization seem to have regressed. No evidence of infection 11/16/14; lites surface debridement done of surface eschar. The wound does not look to be unhealthy. No evidence of infection. Unfortunately the patient has had podiatry issues in the right foot and for some reason has redeveloped small surface ulcerations in the medial right ankle. Her original presentation involved wounds in this area 11/23/14 no debridement. The area on the right ankle has enlarged. The left ankle wound appears stable in terms of the surface although there is periwound inflammation. There has  been regression in the amount of new skin 11/30/14 no debridement. Both wound areas appear healthy. There was no evidence of infection. The the new area on the right medial ankle has enlarged although that both the surfaces appear to be stable. 12/07/14; Debridement of the right medial ankle wound. No no debridement was done on the left. 12/14/14 no major change in and now bilateral medial ankle wounds. Both of these are very painful but the no overt evidence of infection. She has had previous venous ablation 12/21/14; patient states that her right medial ankle wound is considerably more painful last week than usual. Her left is also somewhat painful. She could not tolerate debridement. The right medial ankle wound has fibrinous surface eschar 12/28/14 this is a patient with severe bilateral venous insufficiency ulcers. For a considerable period of time we actually had the one on the right medial ankle healed however this recently opened up again in June. The left medial ankle wound has been a refractory area with some absent flows. We had  some success with Hydrofera Blue on this area and it literally closed by 50% however it is recently opened up Foley. Both of these were debridement today of surface eschar. She tolerates this poorly 01/25/15: No change in the status of this. Thick adherent escar. Very poor tolerance of any attempt at debridement. I had healed the right medial malleolus wound for a considerable amount of time and had the left one down to about 50% of the volume although this is totally regressed over the last 48 weeks. Further the right leg has reopened. she is trying to make a appointment with pain and vascular, previous ablations with Dr. Aleda Grana. I do not believe there is an arterial insufficiency issue here 02/01/15 the status of the adherent eschar bilaterally is actually improved. No debridement was done. She did not manage to get vascular studies done 02/08/15 continued debridement of the area was done today. The slough is less adherent and comes off with less pressure. There is no surrounding infection peripheral pulses are intact 02/15/15 selective debridement with a disposable curette. Again the slough is less adherent and comes off with less difficulty. No surrounding infection peripheral pulses are intact. 02/22/15 selective debridement of the right medial ankle wound. Slough comes off with less difficulty. No obvious surrounding infection peripheral pulses are intact I did not debridement the one on the left. Both of these are stable to improved 03/01/15 selective debridement of both wound areas using a curette to. Adherent slough cup soft with less difficulty. No obvious surrounding infection. The patient tells me that 2 days ago she noted a rash above the right leg wrap. She did not have this on her lower legs when she change this over she arrives with widespread left greater than right almost folliculitis-looking rash which is extremely pruritic. I don't see anything to culture here. There is no rash  on the rest of her body. She feels well systemically. 03/08/15; selective debridement of both wounds using a curette. Base of this does not look unhealthy. She had limegreen drainage coming out of the left leg wound and describes a lot of drainage. The rash on her left leg looks improved to. No cultures were done. 03/22/15; patient was not here last week. Basal wounds does not look healthy and there is no surrounding erythema. No drainage. There is still a rash on the left leg that almost looks vasculitic however it is clearly limited to the top of where the wrap  would be. 04/05/15; on the right required a surgical debridement of surface eschar and necrotic subcutaneous tissue. I did not debridement the area on the left. These continue to be large open wounds that are not changing that much. We were successful at one point in healing the area on the right, and at the same time the area on the left was roughly half the size of current measurements. I think a lot of the deterioration has to do with the prolonged time the patient is on her feet at work 04/19/15 I attempted-like surface debridement bilaterally she does not tolerate this. She tells me that she was in allergic care yesterday with extreme pain over her left lateral malleolus/ankle and was told that she has an "sprain" 05/03/15; large bilateral venous insufficiency wounds over the medial malleolus/medial aspect of her ankles. She complains of copious amounts of drainage and his usual large amounts of pain. There is some increasing erythema around the wound on the right extending into the medial aspect of her foot to. historically she came in with these wounds the right one healed and the left one came down to roughly half its current size however the right one is reopened and the left is expanded. 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 05/10/15 large bilateral venous insufficiency  wounds. There is less adherence surface left however the surface culture that I did last week grew pseudomonas therefore bilateral selective debridement score necessary. There is surrounding erythema. The patient describes severe bilateral drainage and a lot of pain in the left ankle. Apparently her podiatrist was were ready to do a cortisone shot 05/17/15; the patient complains of pain and again copious amounts of drainage. 05/24/15; we used Iodo flex last week. Patient notes considerable improvement in wound drainage. Only needed to change this once. 05/31/15; we continued Iodoflex; the base of these large wounds bilaterally is not too bad but there is probably likely a significant bioburden here. I would like to debridement just doesn't tolerate it. 06/06/14 I would like to continue the Iodoflex although she still hasn't managed to obtain supplies. She has bilateral medial malleoli or large wounds which are mostly superficial. Both of them are covered circumferentially with some nonviable fibrinous slough although she tolerates debridement very poorly. She apparently has an appointment for an ablation on the right leg by interventional radiology. 06/14/15; the patient arrives with the wounds and static condition. We attempted a debridement although she does not do well with this secondary to pain. I 07/05/15; wounds are not much smaller however there appears to be a cleaner granulating base. The left has tight fibrinous slough greater than the right. Debridement is tolerated poorly due to pain. Iodoflex is done more for these wounds in any of the multitude of different dressings I have tried on the left 1 and then subsequently the right. 07/12/15; no change in the condition of this wound. I am able to do an aggressive debridement on the right but not the left. She simply cannot tolerate it. We have been using Iodoflex which helps somewhat. It is worthwhile remembering that at one point we healed the  right medial ankle wound and the left was about 25% of the current circumference. We have suggested returning to vascular surgery for review of possible further ablations for one reason or another she has not been able to do this. 07/26/15 no major change in the condition of either  wound on her medial ankle. I did not attempt to debridement of these. She has been aggressively scrubbing these while she is in the shower at home. She has her supply of Iodoflex which seems to have done more for these wounds then anything I have put on recently. 08/09/15 wound area appears larger although not verified by measurements. Using Iodoflex 09/05/2015 -- she was here for avisit today but had significant problems with the wound and I was asked to see her for a physician opinion. I have summarize that this lady has had surgery on her left lower extremity about 10 years ago where the possible veins stripping was done. She has had an opinion from interventional radiology around November 2016 where no further sclerotherapy was ordered. The patient works 12 hours a day and stands on a concrete floor with work boots and is unable to get the proper compression she requires and cannot elevate her limbs appropriately at any given time. She has recently grown Pseudomonas from her wound culture but has not started her ciprofloxacin which was called in for her. 09/13/15 this continues to be a difficult situation for this patient. At one point I had this wound down to a 1.5 x 1.5" wound on her left leg. This is deteriorated and the right leg has reopened. She now has substantial wounds on her medial calcaneus, malleoli and into her lower leg. One on the left has surface eschar but these are far too painful for me to debridement here. She has a vascular surgery appointment next week to see if anything can be done to help here. I think she has had previous ablations several years ago at [facility]. She has no major edema. She  tells me that she did not get product last time [facility]] and went for several days without it. She continues to work in work boots 12 hours a day. She cannot get compression/4-layer under her work boots. 09/20/15 no major change. Periwound edema control was not very good. Her point with pain and vascular is next Wednesday the 25th 09/28/15; the patient is seen vascular surgery and is apparently scheduled for repeat duplex ultrasounds of her bilateral lower legs next week. 10/05/15; the patient was seen by Dr. Doren Custard of vascular surgery. He feels that she should have arterial insufficiency excluded as cause/contributed to her nonhealing stage she is therefore booked for an arteriogram. She has apparently monophasic signals in the dorsalis pedis pulses. She also of course has known severe chronic venous insufficiency with previous procedures as noted previously. I had another long discussion with the patient today about her continuing to work 12 hour shifts. I've written her out for 2 months area had concerns about this as her work location is currently undergoing significant turmoil and this may lead to her termination. She is aware of this however I agree with her that she simply cannot continue to stand for 12 hours multiple days a week with the substantial wound areas she has. 10/19/15; the Dr. Doren Custard appointment was largely for an arteriogram which was normal. She does not have an arterial issue. He didn't make a comment about her chronic venous insufficiency for which she has had previous ablations. Presumably it was not felt that anything additional could be done. The patient is now out of work as I prescribed 2 weeks ago. Her wounds look somewhat less aggravated presumably because of this. I felt I would give debridement another try today 10/25/15; no major change in this patient's wounds.  We are struggling to get her product that she can afford into her own home through her  insurance. 11/01/15; no major change in the patient's wounds. I have been using silver alginate as the most affordable product. I spoke to Dr. Marla Roe last week with her requested take her to the OR for surgical debridement and placement of ACEL. Dr. Marla Roe told me that she would be willing to do this however [facility] will not cover this, fortunately the patient has [place] [facility] of some variant 11/08/15; no major change in the patient's wounds. She has been completely nonviable surface that this but is in too much pain with any attempted debridement are clinic. I have arranged for her to see Dr. Marla Roe ham of plastic surgery and this appointment is on Monday. I am hopeful that they will take her to the OR for debridement, possible ACEL ultimately possible skin graft 11/22/15 no major change in the patient's wounds over her bilateral medial calcaneus medial malleolus into the lower legs. Surface on these does not look too bad however on the left there is surrounding erythema and tenderness. This may be cellulitis or could him sleepy tinea. 11/29/15; no major changes in the patient's wounds over her bilateral medial malleolus. There is no infection here and I don't think any additional antibiotics are necessary. There is now plan to move forward. She sees Dr. Marla Roe in a week's time for preparation for operative debridement and ACEL placement I believe on 7/12. She then has a follow-up appointment with Dr. Marla Roe on 7/21 12/28/15; the patient returns today having been taken to the [place] by Dr. Marla Roe 12/12/15 she underwent debridement, intraoperative cultures [which were negative]. She had placement of a wound VAC. Parent really ACEL was not available to be placed. The wound VAC foam apparently adhered to the wound since then she's been using silver alginate, Xeroform under Ace wraps. She still says there is a lot of drainage and a lot of pain 01/31/16; this is a  patient I see monthly. I had referred her to Dr. Marla Roe him of plastic surgery for large wounds on her bilateral medial ankles. She has been to the OR twice once in early July and once in early August. She tells me over the last 3 weeks she has been using the wound VAC with ACEL underneath it. On the right we've simply been using silver alginate. Under Kerlix Coban wraps. 02/28/16; this is a patient I'm currently seeing monthly. She is gone on to have a skin graft over her large venous insufficiency ulcer on the left medial ankle. This was done by Dr. Marla Roe him. The patient is a bit perturbed about why she didn't have one on her right medial ankle wound. She has been using silver alginate to this. 03/06/16; I received a phone call from her plastic surgery Dr. Marla Roe. She expressed some concern about the viability of the skin graft she did on the left medial ankle wound. Asked me to place Endoform on this. She told me she is not planning to do a subsequent skin graft on the right as the left one did not take very well. I had placed Hydrofera Blue on the right 03/13/16; continue to have a reasonably healthy wound on the right medial ankle. Down to 3 mm in terms of size. There is epithelialization here. The area on the left medial ankle is her skin graft site. I suppose the last week this looks somewhat better. She has an open area  inferiorly however in the center there appears to be some viable tissue. There is a lot of surface callus and eschar that will eventually need to come off however none of this looked to be infected. Patient states that the is able to keep the dressing on for several days which is an improvement. 03/20/16 no major change in the circumference of either wound however on the left side the patient was at Dr. Eusebio Friendly office and they did a debridement of left wound. 50% of the wound seems to be epithelialized. I been using Endoform on the left Hydrofera Blue in the  right 03/27/16; she arrives today with her wound is not looking as healthy as they did last week. The area on the right clearly has an adherent surface to this a very similar surface on the left. Unfortunately for this patient this is all too familiar problem. Clearly the Endoform is not working and will need to change that today that has some potential to help this surface. She does not tolerate debridement in this clinic very well. She is changing the dressing wants 04/03/16; patient arrives with the wounds looking somewhat better especially on the right. Dr. Migdalia Dk change the dressing to silver alginate when she saw her on Monday and also sold her some compression socks. The usefulness of the latter is really not clear and woman with severely draining wounds. 04/10/16; the patient is doing a bit of an experiment wearing the compression stockings that Dr. Migdalia Dk provided her to her left leg and the out of legs based dressings that we provided to the right. 05/01/16; the patient is continuing to wear compression stockings Dr. Migdalia Dk provided her on the left that are apparently silver impregnated. She has been using Iodoflex to the right leg wound. Still a moderate amount of drainage, when she leaves here the wraps only last for 4 days. She has to change the stocking on the left leg every night 05/15/16; she is now using compression stockings bilaterally provided by Dr. Marla Roe. She is wearing a nonadherent layer over the wounds so really I don't think there is anything specific being done to this now. She has some reduction on the left wound. The right is stable. I think all healing here is being done without a specific dressing 06/09/16; patient arrives here today with not much change in the wound certainly in diameter to large circular wounds over the medial aspect of her ankle bilaterally. Under the light of these services are certainly not viable for healing. There is no evidence of  surrounding infection. She is wearing compression stockings with some sort of silver impregnation as prescribed by Dr. Marla Roe. She has a follow-up with her tomorrow. 06/30/16; no major change in the size or condition of her wounds. These are still probably covered with a nonviable surface. She is using only her purchase stockings. She did see Dr. Marla Roe who seemed to want to apply Dakin's solution to this I'm not extreme short what value this would be. I would suggest Iodoflex which she still has at home. 07/28/16; I follow Mrs. Ciccarelli episodically along with Dr. Marla Roe. 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 She has been applying a topical collagen ointment to both wounds with Adaptic. I don't think Dr. Marla Roe is planning to take her back to the OR. 08/19/16; I follow Mrs. Jeneen Rinks on a monthly basis along with Dr. Marla Roe of plastic surgery. She has very refractory venous insufficiency wounds  on the bilateral medial lower legs left greater than right. I been following her for a number of years. At one point I was able to get the right medial malleolus wound to heal and had the left medial malleolus down to about half its current size however and I had to send her to plastic surgery for an operative debridement. Since then things have been stable to slightly improve the area on the right is slightly better one in the left about the same although there is much less adherent surface than I'm used to with this patient. She is using some form of liquid collagen gel that Dr. Marla Roe provided a Kerlix cover with the patient's own pressure stockings. She tells me that she has extreme pain in both ankles and along the lateral aspect of both feet. She has been unable to work for some period of time. She is telling me she is retiring at the beginning of April. She sees Dr. Doran Durand of orthopedics next week 09/22/16; patient has  not seen Dr. Marla Roe since the last time she is here. I'm not really sure what she is using to the wounds other than bits and pieces of think she had left over including most recently Hydrofera Blue. She is using juxtalite stockings. She is having difficulty with her husband's recent illness "stroke". She is having to transport him to various doctors appointments. Dr. Marla Roe left her the option of a repeat debridement with ACEL however she has not been able to get the time to follow-up on this. She continues to have a fair amount of drainage out of these wounds with certainly precludes leaving dressings on all week 10/13/16; patient has not seen Dr. Marla Roe since she was last in our clinic. I'm not really sure what she is doing with the wounds, we did try to get her [facility] and I think she is actually using this most of the time. Because of drainage she states she has to change this every second day although this is an improvement from what she used to do. She went to see Dr. Doran Durand who did not think she had a muscular issue with regards to her feet, he referred her to a neurologist and I think the appointment is sometime in June. I changed her back to Iodoflex which she has used in the past but not recently. 11/03/16; the patient has been using Iodoflex although she ran out of this. Still claims that there is a lot of drainage although the wound does not look like this. No surrounding erythema. She has not been back to see Dr. Marla Roe 11/24/16; the patient has been using Iodoflex again but she ran out of it 2 or 3 days ago. There is no major change in the condition of either one of these wounds in fact they are larger and covered in a thick adherent surface slough/nonviable tissue especially on the left. She does not tolerate mechanical debridement in our clinic. Going back to see Dr. Marla Roe of plastic surgery for an operative debridement would seem reasonable. 12/15/16; the  patient has not been back to see Dr. Marla Roe. She is been dealing with a series of illnesses and her husband which of monopolized her time. She is been using Sorbact which we largely supplied. She states the drainage is bad enough that it maximum she can go 2-3 days without changing the dressing 01/12/2017 -- the patient has not been back for about 4 weeks and has not seen Dr. Marla Roe not does  she have any appointment pending. 01/23/17; patient has not seen Dr. Marla Roe even though I suggested this previously. She is using Santyl that was suggested last week by Dr. Con Memos this Cost her $16 through her insurance which is indeed surprising 02/12/17; continuing Santyl and the patient is changing this daily. A lot of drainage. She has not been back to see plastic surgery she is using an Ace wrap. Our intake nurse suggested wrap around stockings which would make a good reasonable alternative 02/26/17; patient is been using Santyl and changing this daily due to drainage. She has not been to see plastic surgery she uses in April Ace wrap to control the edema. She did obtain extremitease stockings but stated that the edema in her leg was to big for these 03/20/17; patient is using Santyl and Anasept. Surfaces looked better today the area on the right is actually measuring a little smaller. She has states she has a lot of pain in her feet and ankles and is asking for a consult to pain control which I'll try to help her with through our case manager. 04/10/17; the patient arrives with better-looking wound surfaces and is slightly smaller wound on the left she is using a combination of Santyl and Anasept. She has an appointment or at least as started in the pain control center associated with [facility] 05/14/17; this is a patient who I followed for a prolonged period of time. 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 At one point I had this down to a much smaller wound  on the left however these reopened and we've never been able to get these to heal. She has been using Santyl and Anasept gel although 2 weeks ago she ran out of the Anasept gel. She has a stable appearance of the wound. She is going to the wound care clinic at [facility]. They wanted do a nerve block/spinal block although she tells me she is reluctant to go forward with that. 05/21/17; this is a patient I have followed for many years. 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 Chronic pain and deformity in her ankles as well. She is been to see plastic surgery as well as orthopedics. Using PolyMem AG most recently/Kerramax/ABDs and 2 layer compression. She has managed to keep this on and she is coming in for a nurse check to change the dressing on Tuesdays, we see her on Fridays 06/05/17; really quite a good looking surface and the area especially on the right medial has contracted in terms of dimensions. Well granulated healthy-looking tissue on both sides. Even with an open curet there is nothing that even feels abnormal here. This is as good as I've seen this in quite some time. We have been using PolyMem AG and bringing her in for a nurse check 06/12/17; really quite good surface on both of these wounds. The right medial has contracted a bit left is not. We've been using PolyMem and AG and she is coming in for a nurse visit 06/19/17; we have been using PolyMem AG and bringing her in for a nurse check. Dimensions of her wounds are not better but the surfaces looked better bilaterally. She complained of bleeding last night and the left wound and increasing pain bilaterally. She states her wound pain is more neuropathic than just the wounds. There was some suggestion that this was radicular from her pain management doctor in talking to her it is really difficult to sort this out.  06/26/17; using PolyMem and AG and bringing her in for a nurse check as All of this and  reasonably stable condition. Certainly not improved. The dimensions on the lateral part of the right leg look better but not really measuring better. The medial aspect on the left is about the same. 07/03/16; we have been using PolyMen AG and bringing her in for a nurse check to change the dressings as the wounds have drainage which precludes once weekly changing. We are using all secondary absorptive dressings.our intake nurse is brought up the idea of using a wound VAC/snap VAC on the wound to help with the drainage to see if this would result in some contraction. This is not a bad idea. The area on the right medial is actually looking smaller. Both wounds have a reasonable-looking surface. There is no evidence of cellulitis. The edema is well controlled 07/10/17; the patient was denied for a snap VAC by her insurance. The major issue with these wounds continues to be drainage. We are using wicked PolyMem AG and she is coming in for a nurse visit to change this. The wounds are stable to slightly improved. The surface looks vibrant and the area on the right certainly has shrunk in size but very slowly 07/17/17; the patient still has large wounds on her bilateral medial malleoli. Surface of both of these wounds looks better. The dimensions seem to come and go but no consistent improvement. There is no epithelialization. We do not have options for advanced treatment products due to insurance issues. They did not approve of the wound VAC to help control the drainage. More recently we've been using PolyMem and AG wicked to allow drainage through. We have been bringing her in for a nurse visit to change this. We do not have a lot of options for wound care products and the home again due to insurance issues 07/24/17; the patient's wound actually looks somewhat better today. No drainage measurements are smaller still healthy-looking surface. We used silver collagen under PolyMen started last week. We have  been bringing her in for a dressing change 07/31/17; patient's wound surface continued to look better and I think there is visible change in the dimensions of the wound on the right. Rims of epithelialization. We have been using silver collagen under PolyMen and bringing her in for a dressing change. There appears to be less drainage although she is still in need of the dressing change 08/07/17. Patient's wound surface continues to look better on both sides and the area on the right is definitely smaller. We have been using silver collagen and PolyMen. She feels that the drainage has been it has been better. I asked her about her vascular status. She went to see Dr. Aleda Grana at [facility] and had some form of ablation. I don't have much detail on this. I haven't my notes from 2016 that she was not a candidate for any further ablation but I don't have any more information on this. We had referred her to vein and vascular I don't think she ever went. He does not have a history of PAD although I don't have any information on this either. We don't even have ABIs in our record 08/14/17; we've been using silver collagen and PolyMen cover. And putting the patient and compression. She we are bringing her in as a nurse visit to change this because ofarge amount of drainage. We didn't the ABIs in clinic today since they had been done in many moons 1.2  bilaterally. She has been to see vein and vascular however this was at [facility] and she had ablation although I really don't have any information on this all seemed biking get a report. She is also been operatively debrided by plastic surgery and had a cell placed probably 8-12 months ago. This didn't have a major effect. We've been making some gains with current dressings 08/19/17-She is here in follow-up evaluation for bilateral medial malleoli ulcers. She continues to tolerate debridement very poorly. We will continue with recently changed topical  treatment; if no significant improvement may consider switching to Iodosorb/Iodoflex. 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 She has been using silver collagen and PolyMem. I believe she has been used and tried on Iodoflex before. 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 These have been very refractory over time. We have been using silver collagen and PolyMen. She is coming in once a week for a doctor's and once a week for nurse visits. We are actually making some progress 09/18/17; the patient's wounds are smaller especially on the right medial. She arrives today to upset to consider even washing these off with Anasept which I think is been part of the reason this is been closing. We've been using collagen covered in PolyMen otherwise. It is noted that she has a small area of folliculitis on the right medial calf that. As we are wrapping her legs I'll give her a short course of doxycycline to make sure this doesn't amount to anything. She is a long list of complaints today including imbalance, shortness of breath on exertion, inversion of her left ankle. With regards to the latter complaints she is been to see orthopedics and they offered her a tendon release surgery I believe but wanted her wounds to be closed first. I have recommended she go see her primary physician with regards to everything else. 09/25/17; patient's wounds are about the same size. We have made some progress bilaterally although not in recent weeks. She will not allow me T wash these wounds with Anasept even if she is doing her cell. Wheeze we've been using collagen covered in PolyMen. Last week she had a small area of folliculitis this is now opened into a small wound. She  completed 5 days of trimethoprim sulfamethoxazole 10/02/17; unfortunately the area on her left medial ankle is worse with a larger wound area towards the Achilles. The patient complains of a lot of pain. She will not allow debridement although visually I don't think there is anything to debridement in any case. We have been using silver collagen and PolyMen for several months now. Initially we are making some progress although I'm not really seeing that today. We will move back to [facility]. His admittedly this is a bit of a repeat however I'm hoping that his situation is different now. The patient tells me she had her leg on the left give out on her yesterday this is process some pain. 10/09/17; the patient is seen twice a week largely because of drainage issues coming out of the chronic medial bimalleolar wounds that are chronic. Last week the dimensions of the one on the left looks a little larger I changed her to [facility]. She comes in today with a history of terrible pain in  the bilateral wound areas. She will not allow debridement. She will not even allow a tissue culture. There is no surrounding erythema no no evidence of cellulitis. We have been putting her Kerlix Coban man. She will not allow more aggressive compression as there was a suggestion to put her in 3 layer wraps. 10/16/17; large wounds on her bilateral medial malleoli. These are chronic. Not much change from last week. The surface looks have healthy but absolutely no epithelialization. A lot of pain little less so of drainage. She will not allow debridement or even washing these off in the vigorous fashion with Anasept. 10/23/17; large wounds on her bilateral malleoli which are chronic. Some improvement in terms of size perhaps on the right since last time I saw these. She states that after we increased the 3 layer compression there was some bleeding, when she came in for a nurse visit she did not want 3 layer compression  put back on about our nurse managed to convince her. She has known chronic venous visit issues and I'm hoping to get her to tolerate the 3 layer compression. using Hydrofera Blue 10/30/17; absolutely no change in the condition of either wound although we've had some improvement in dimensions on the right.. Attempted to put her in 3 layer compression she didn't tolerated she is back in 2 layer compression. We've been using Hydrofera Blue We looked over her past records. She had venous reflux studies in November 2016. There was no evidence of deep venous reflux on the right. Superficial vein did not show the greater saphenous vein at think this is been previously ablated the small saphenous vein was within normal limits. The left deep venous system showed no DVT the vessels were positive for deep venous reflux in the posterior tibial veins at the ankle. The greater saphenous vein was surgically absent small saphenous vein was within normal limits. She went to vein and vascular at [facility]. I believe she had an ablation on the left greater saphenous vein. I'll update her reflux studies perhaps ever reviewed by vein and vascular. We've made absolutely no progress in these wounds. Will also try to read and TheraSkins through her insurance 11/06/17; W the patient apparently has a 2 week follow-up with vein and vascular I like him to review the whole issue with regards to her previous vascular workup by Dr. Aleda Grana. We've really made no progress on these wounds in many months. She arrives today with less viable looking surface on the left medial ankle wound. This was apparently looking about the same on Tuesday when she was here for nurse visit. 11/13/17; deep tissue culture I did last time of the left lower leg showed multiple organisms without any predominating. In particular no Staphylococcus or group A strep were isolated. We sent her for venous reflux studies. She's had a previous left  greater saphenous vein stripping and I think sclerotherapy of the right greater saphenous vein. She didn't really look at the lesser saphenous vein this both wounds are on the medial aspect. She has reflux in the common femoral vein and popliteal vein and an accessory vein on the right and the common femoral vein and popliteal vein on the left. I'm going to have her go to see vein and vascular just the look over things and see if anything else beside aggressive compression is indicated here. We have not been able to make any progress on these wounds in spite of the fact that the surface of the wounds is  never look too bad. 11/20/17; no major change in the condition of the wounds. Patient reports a large amount of drainage. She has a lot of complaints of pain although enlisting her today I wonder if some of this at least his neuropathic rather than secondary to her wounds. She has an appointment with vein and vascular on 12/30/17. The refractory nature of these wounds in my mind at least need vein and vascular to look over the wounds the recent reflux studies we did and her history to see if anything further can be done here. I also note her gait is deteriorated quite a bit. 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 She has a bilateral Trendelenburg gait. I wonder if this is neuropathic or perhaps multilevel radicular. 11/27/17; her wounds actually looks slightly better. Healthy-looking granulation tissue a scant amount of epithelialization. [place] [facility] will not pay for [employer]. They will play for tri layer Oasis and Dermagraft. This is not a diabetic ulcer. We'll try for the tri layer Oasis. She still complains of some drainage. She has a vein and vascular appointment on 12/30/17 12/04/17; the wounds visually look quite good. Healthy-looking granulation with some degree of epithelialization. We are still waiting for response to our request for trial to try layer Oasis. Her  appointment with vascular to review venous and arterial issues isn't sold the end of July 7/31. Not allow debridement or even vigorous cleansing of the wound surface. 12/18/17; slightly smaller especially on the right. 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 We've been using Hydrofera Blue. We still are looking into triple layer Oasis through her insurance 01/08/18 on evaluation today patient's wound actually appears to be showing signs of good improvement at this point in time. She has been tolerating the dressing changes without complication. Fortunately there does not appear to be any evidence of infection at this point in time. We have been utilizing silver nitrate which does seem to be of benefit for her which is also good news. Overall I'm very happy with how things seem to be both regards appearance as well as measurement. Patient did see Dr. Bridgett Larsson for evaluation on 12/30/17. In his assessment he felt that stripping would not likely add much more than chronic compression to the patient's healing process. His recommendation was to follow-up in three months with Dr. Doren Custard if she hasn't healed in order to consider referral back to you and see vascular where she previously was in a trial and was able to get her wound to heal. I'll be see what she feels she when you staying compression and he reiterated this as well. 01/13/18 on evaluation today patient appears to actually be doing very well in regard to her bilateral medial malleolus ulcers. She seems to have tolerated the chemical cauterization with silver nitrate last week she did have some pain through that evening but fortunately states that I'll be see since it seems to be doing better she is overall pleased with the progress. 01/21/18; really quite a remarkable improvement since I've last seen these wounds. We started using silver nitrate specially on the islands of hyper granulation which for some reason her  around the wound circumference. This is really done quite nicely. Primary dressing Hydrofera Blue under 4 layer compression. She seems to be able to hold out without a nurse rewrap. Follow-up in 1 week 01/28/18; we've continued the hydrofera blue but continued with chemical cauterization to the wound area that we started about a month  ago for irregular hyper granulation. She is made almost stunning improvement in the overall wound dimensions. I was not really expecting this degree of improvement in these chronic wounds 02/05/18; we continue with Hydrofera Bluebut of also continued the aggressive chemical cauterization with silver nitrate. We made nice progress with the right greater than left wound. 02/12/18. We continued with Hydrofera Blue after aggressive chemical cauterization with silver nitrate. We appear to be making nice progress with both wound areas 02/19/2018; we continue with [facility] after washing the wounds vigorously with Anasept spray and chemical cauterization with silver nitrate. We are making excellent progress. The area on the right's just about closed 02/26/2018. The area on the left medial ankle had too much necrotic debris today. I used a #5 curette we are able to get most of the soft. I continued with the silver nitrate to the much smaller wound on the right medial ankle she had a new area on her right lower pretibial area which she says was due to a role in her compression 03/05/2018; both wound areas look healthy. Not much change in dimensions from last week. I continue to use silver nitrate and Hydrofera Blue. The patient saw Dr. Doren Custard of vein and vascular. He felt she had venous stasis ulcers. He felt based on her previous arteriogram she should have adequate circulation for healing. Also she has deep venous reflux but really no significant correctable superficial venous reflux at this time. He felt we should continue with conservative management including leg elevation  and compression 04/02/2018; since we last saw this woman about a month ago she had a fall apparently suffered a pelvic fracture. I did not look up the x-ray. Nevertheless because of pain she literally was bedbound for 2 weeks and had home health coming out to change the dressing. Somewhat predictably this is resulted in considerable improvement in both wound areas. The right is just about closed on the medial malleolus and the left is about half the size. 04/16/2018; both her wounds continue to go down in size. Using Hydrofera Blue. 05/07/18; both her wounds appeared to be improving especially on the right where it is almost closed. We are using Hydrofera Blue 05/14/2018; slightly worse this week with larger wounds. Surface on the left medial not quite as good. We have been using Hydrofera Blue 05/21/18; again the wounds are slightly larger. Left medial malleolus slightly larger with eschar around the circumference. We have been using Hydrofera Blue undergoing a wraps for a prolonged period of time. This got a lot better when she was more recumbent due to a fall and a back injury. I change the primary dressing the silver alginate today. She did not tolerate a 4 layer compression previously although I may need to bring this up with her next time 05/28/2018; area on the left medial malleolus again is slightly larger with more drainage. Area on the right is roughly unchanged. She has a small area of folliculitis on the right medial just on the lower calf. This does not look ominous. 06/03/2018 left medial malleolus slightly smaller in a better looking surface. We used silver nitrate on this last time with silver alginate. The area on the right appears slightly smaller 1/10; left medial malleolus slightly smaller. Small open area on the right. We used silver nitrate and silver alginate as of 2 weeks ago. We continue with the wound and compression. These got a lot better when she was off her feet 1/17;  right medial malleolus wound is  smaller. The left may be slightly smaller. Both surfaces look somewhat better. 1/24; both wounds are slightly smaller. Using silver alginate under Unna boots 1/31; both wounds appear smaller in fact the area on the right medial is just about closed. Surface eschar. We have been using silver alginate under Unna boots. The patient is less active now spends let much less time on her feet and I think this is contributed to the general improvement in the wound condition 2/7; both wounds appear smaller. I was hopeful the right medial would be closed however there there is still the same small open area. Slight amount of surface eschar on the left the dimensions are smaller there is eschar but the wound edges appear to be free. We have been using silver alginate under Unna boot's 2/14; both wounds once again measure smaller. Circumferential eschar on the left medial. We have been using silver alginate under Unna boots with gradual improvement 2/21; the area on the right medial malleolus has healed. The area on the left is smaller. We have been using silver alginate and Unna boots. We can discharge wrapping the right leg she has 20/30 stockings at home she will need to protect the scar tissue in this area 2/28; the area on the right medial malleolus remains closed the patient has a compression stocking. The area on the left is smaller. We have been using silver alginate and Unna boots. 3/6 the area on the right medial ankle remains closed. Good edema control noted she is using her own compression stocking. The area on the left medial ankle is smaller. We have been managing this with silver alginate and Unna boots which we will continue today. 3/13; the area on the right medial ankle remains closed and I'm declaring it healed today. When necessary the left is about the same still a healthy-looking surface but no major change and wound area. No evidence of infection  and using silver alginate under unna and generally making considerable improvement 3/27 the area on the right medial ankle remains closed the area on the left is about the same as last week. Certainly not any worse we have been using silver alginate under an Unna boot 4/3; the area on the right medial ankle remains closed per the patient. We did not look at this wound. The wound on the left medial ankle is about the same surface looks healthy we have been using silver alginate under an Unna boot 4/10; area on the right medial ankle remains closed per the patient. We did not look at this wound. The wound on the left medial ankle is slightly larger. The patient complains that the [facility] caused burning pain all week. She also told us that she was a lot more active this week. Changed her back to silver alginate 4/17; right medial ankle still closed per the patient. Left medial ankle is slightly larger. Using silver alginate. She did not tolerate Hydrofera Blue on this area 4/24; right medial ankle remains closed we have not look at this. The left medial ankle continues to get larger today by about a centimeter. We have been using silver alginate under Unna boots. She complains about 4 layer compression as an alternative. She has been up on her feet working on her garden 5/8; right medial ankle remains closed we did not look at this. The left medial ankle has increased in size about 100%. We have been using silver alginate under Unna boots. She noted increased pain this week and was  not surprised that the wound is deteriorated 5/15; no major change in SA however much less erythema ( one week of doxy ocellulitis). 5/22-64 year old female returns at 1 week to the clinic for left medial ankle wound for which we have been using silver alginate under 3 layer compression She was placed on DOXY at last visit - the wound is wider at this visit. She is in 3 layer compression 5/29; change to [facility] last week. I had given her empiric doxycycline 2 weeks ago for a week. She is in 3 layer compression. She complains of a lot of pain and drainage on presentation today. 6/5; using Hydrofera Blue. I gave her doxycycline recently empirically for erythema and pain around the wound. Believe her cultures showed enterococcus which not would not have been well covered by doxycycline nevertheless the wound looks better and I don't feel specifically that the enterococcus needs to be covered. She has a new what looks like a wrap injury on her lateral left ankle. 6/12; she is using Hydrofera Blue. She has a new area on the left anterior lower tibial area. This was a wrap injury last week. 6/19; the patient is using Hydrofera Blue. She arrived with marked inflammation and erythema around the wound and tenderness. 12/01/18 on evaluation today patient appears to be doing a little bit better based on what I'm hearing from the standpoint of lassos evaluation to this as far as the overall appearance of the wound is concerned. Then sometime substandard she typically sees Dr. Dellia Nims. Nonetheless overall very pleased with the progress that she's made up to this point. No fevers, chills, nausea, or vomiting noted at this time. 7/10; some improvement in the surface area. Aggressively debrided last week apparently. I went ahead with the debridement today although the patient does not tolerate this very well. We have been using Iodoflex. Still a fair amount of drainage 7/17; slightly smaller. Using Iodoflex. 7/24; no change from last week in terms of surface area. We have been using Iodoflex. Surface looks and continues to look somewhat better 7/31; surface area slightly smaller better looking surface. We have been using Iodoflex. This is under Unna boot compression 8/7-Patient presents at 1 week with Unna boot and Iodoflex, wound appears better 8/14-Patient presents at 1 week with Iodoflex, we use the Unna boot,  wound appears to be stable better.Patient is getting Botox treatment for the inversion of the foot for tendon release, Next week 8/21; we are using Iodoflex. Unna boot. The wound is stable in terms of surface area. Under illumination there is some areas of the wound that appear to be either epithelialized or perhaps this is adherent slough at this point I was not really clear. It did not wipe off and I was reluctant to debride this today. 8/28; we are using Iodoflex in an Unna boot. Seems to be making good improvement. 9/4; using Iodoflex and wound is slightly smaller. 9/18; we are using Iodoflex with topical silver nitrate when she is here. The wound continues to be smaller 10/2; patient missed her appointment last week due to GI issues. She left and Iodoflex based dressing on for 2 weeks. Wound is about the same size about the size of a dime on the left medial lower 10/9 we have been using Iodoflex on the medial left ankle wound. She has a new superficial probable wrap injury on the dorsal left ankle 10/16; we have been using Hydrofera Blue since last week. This is on the left medial ankle  10/23; we have been using Hydrofera Blue since 2 weeks ago. This is on the left medial ankle. Dimensions are better 11/6; using Hydrofera Blue. I think the wound is smaller but still not closed. Left medial ankle 11/13; we have been using Hydrofera Blue. Wound is certainly no smaller this week. Also the surface not as good. This is the remanent of a very large area on her left medial ankle. 11/20; using Sorbact since last week. Wound was about the same in terms of size although I was disappointed about the surface debris 12/11; 3-week follow-up. Patient was on vacation. Wound is measuring slightly larger we have been using Sorbact. 12/18; wound is about the same size however surface looks better last week after debridement. We have been using Sorbact under compression 1/15 wound is probably twice the size  of last time increased in length nonviable surface. We have been using Sorbact. She was running a mild fever and missed her appointment last week 1/22; the wound is come down in size but under illumination still a very adherent debris we have been Hydrofera Blue that I changed her to last week 1/29; dimensions down slightly. We have been using Hydrofera Blue 2/19 dimensions are the same however there is rims of epithelialization under illumination. Therefore more the surface area may be epithelialized Objective Constitutional Sitting or standing Blood Pressure is within target range for patient.. Pulse regular and within target range for patient.Marland Kitchen Respirations regular, non-labored and within target range.. Temperature is normal and within the target range for the patient.Marland Kitchen Appears in no distress. Vitals Time Taken: 10:39 AM, Height: 68 in, Weight: 127 lbs, BMI: 19.3, Temperature: 98 F, Pulse: 59 bpm, Respiratory Rate: 18 breaths/min, Blood Pressure: 119/59 mmHg. Cardiovascular Pedal pulses palpable. She has no major edema.. General Notes: Wound exam; left medial malleolus under illumination shows some rims of epithelialization. This may be an improvement. No real measurement in surface area though. The rims of epithelialization themselves have rims of still open wound. No evidence of infection. No mechanical debridement was felt to be necessary Integumentary (Hair, Skin) Wound #3 status is Open. Original cause of wound was Gradually Appeared. The wound is located on the Left,Medial Malleolus. The wound measures 1.6cm length x 1.4cm width x 0.1cm depth; 1.759cm^2 area and 0.176cm^3 volume. There is Fat Layer (Subcutaneous Tissue) Exposed exposed. There is no tunneling or undermining noted. There is a medium amount of serosanguineous drainage noted. The wound margin is distinct with the outline attached to the wound base. There is large (67-100%) red, pink granulation within the wound  bed. There is no necrotic tissue within the wound bed. 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 There was a Haematologist Compression Therapy Procedure by Deon Pilling, RN. Post procedure Diagnosis Wound #3: Same as Pre-Procedure Plan Follow-up Appointments: Return Appointment in 1 week. - Friday Dressing Change Frequency: Wound #3 Left,Medial Malleolus: Do not change entire dressing for one week. - change dressing in one week Skin Barriers/Peri-Wound Care: Moisturizing lotion TCA Cream or Ointment - mixed with lotion Wound Cleansing: Clean wound with Wound Cleanser - clean with anasept with dressing changes May shower with protection. Primary Wound Dressing: Wound #3 Left,Medial Malleolus: Skin Substitute Application - Running IVR for Apligraft Hydrofera Blue - classic Secondary Dressing:  Wound #3 Left,Medial Malleolus: Dry Gauze Edema Control: Unna Boot to Left Lower Extremity - no kerlix layer Avoid standing for long periods of time Elevate legs to the level of the heart or above for 30 minutes daily and/or when sitting, a frequency of: - throughout the day Support Garment 20-30 mm/Hg pressure to: - compression stocking right leg daily 1. In view of the epithelialization under illumination of elected to continue with Hydrofera Blue 2. She has seen plastic surgery in the past although the wound was much bigger than it is now. I am uncertain about whether she might be a candidate for skin graft of this area 3. Also I wonder about an Apligraf. She had a nice response to this quite a few years ago I wonder whether we might be able to finally get this to close. We will run it through her insurance Electronic Signature(s) Signed:  07/22/2019 6:29:59 PM By: Linton Ham MD Entered By: Linton Ham on 07/22/2019 11:37:19 -------------------------------------------------------------------------------- SuperBill Details Patient Name: Date of Service: [name] 07/22/2019 Medical Record [id number] Patient Account Number: [id number] Date of Birth/Sex: Treating RN: 06/14/1955 (64 y.o. 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

## 2019-07-25 NOTE — Progress Notes (Signed)
AURIANA, SCALIA ([id number]) Visit Report for 07/22/2019 Arrival Information Details Patient Name: Date of Service: ZHOE, CATANIA 07/22/2019 10:15 AM Medical Record [id number] Patient Account Number: [id number] Date of Birth/Sex: Treating RN: 11/23/1955 (64 y.o. [name], [name] Primary Care Provider: [name] Other Clinician: Referring Provider: Treating Provider/Extender:Robson, Tawny Asal in Treatment: 345 Visit Information History Since Last Visit Cane Added or deleted any medications: No Patient Arrived: 10:39 Any new allergies or adverse reactions: No Arrival Time: Had a fall or experienced change in No Accompanied By: self None activities of daily living that may affect Transfer Assistance: risk of falls: Patient Identification Verified: Yes Signs or symptoms of abuse/neglect since last No Secondary Verification Process Completed: Yes visito Patient Requires Transmission-Based No Hospitalized since last visit: No Precautions: Implantable device outside of the clinic excluding No Patient Has Alerts: No cellular tissue based products placed in the center since last visit: Has Dressing in Place as Prescribed: Yes Has Compression in Place as Prescribed: Yes Pain Present Now: Yes Electronic Signature(s) Signed: 07/22/2019 6:28:18 PM By: Deon Pilling Entered By: Deon Pilling on 07/22/2019 10:39:40 -------------------------------------------------------------------------------- Compression Therapy Details Patient Name: Date of Service: MAGALLY, VAHLE 07/22/2019 10:15 AM Medical Record [id number] Patient Account Number: [id number] Date of Birth/Sex: Treating RN: 22-Jun-1955 (64 y.o. [name] Primary Care Provider: [name] Other Clinician: Referring Provider: Treating Provider/Extender:Robson, Tawny Asal in Treatment: 345 Compression Therapy Performed for Wound Wound #3 Left,Medial  Malleolus Assessment: Performed By: Clinician Deon Pilling, RN Compression Type: Rolena Infante Post Procedure Diagnosis Same as Pre-procedure Electronic Signature(s) Signed: 07/25/2019 1:40:10 PM By: Kela Millin Entered By: Kela Millin on 07/22/2019 11:20:00 -------------------------------------------------------------------------------- Encounter Discharge Information Details Patient Name: Date of Service: [name] 07/22/2019 10:15 AM Medical Record [id number] Patient Account Number: [id number] Date of Birth/Sex: Treating RN: February 24, 1956 (64 y.o. [name] Primary Care Provider: [name] Other Clinician: Referring Provider: Treating Provider/Extender:Robson, Tawny Asal in Treatment: [phone] Encounter Discharge Information Items Discharge Condition: Stable Ambulatory Status: Cane Discharge Destination: Home Transportation: Private Auto Accompanied By: self Schedule Follow-up Appointment: Yes Clinical Summary of Care: Electronic Signature(s) Signed: 07/22/2019 6:28:18 PM By: Deon Pilling Entered By: Deon Pilling on 07/22/2019 11:37:18 -------------------------------------------------------------------------------- Lower Extremity Assessment Details Patient Name: Date of Service: TIMERA, WINDT 07/22/2019 10:15 AM Medical Record [id number] Patient Account Number: [id number] Date of Birth/Sex: Treating RN: [birth year] (64 y.o. [name] Primary Care Provider: [name] Other Clinician: Referring Provider: Treating Provider/Extender:Robson, Velva Harman, Essie Christine in Treatment: 345 Edema Assessment Assessed: [Left: Yes] [Right: No] Edema: [Left: N] [Right: o] Calf Left: Right: Point of Measurement: 33 cm From Medial Instep 27 cm cm Ankle Left: Right: Point of Measurement: 12 cm From Medial Instep 20.5 cm cm Vascular Assessment Pulses: Dorsalis Pedis Palpable: [Left:Yes] Electronic  Signature(s) Signed: 07/22/2019 6:28:18 PM By: Deon Pilling Entered By: Deon Pilling on 07/22/2019 10:43:17 -------------------------------------------------------------------------------- Multi Wound Chart Details Patient Name: Date of Service: [name] 07/22/2019 10:15 AM Medical Record [id number] Patient Account Number: [id number] Date of Birth/Sex: Treating RN: 1955-08-24 (64 y.o. [name] Primary Care Provider: [name] Other Clinician: Referring Provider: Treating Provider/Extender:Robson, Tawny Asal in Treatment: 345 Vital Signs Height(in): 68 Pulse(bpm): 59 Weight(lbs): 127 Blood Pressure(mmHg): 119/59 Body Mass Index(BMI): 19 Temperature(F): 98 Respiratory 18 Rate(breaths/min): Photos: [3:No Photos] [N/A:N/A] Wound Location: [3:Left Malleolus - Medial] [N/A:N/A] Wounding Event: [3:Gradually Appeared] [N/A:N/A] Primary Etiology: [3:Venous Leg Ulcer] [N/A:N/A] Comorbid History: [3:Congestive Heart Failure, Peripheral Vascular Disease, Congestive  Heart Failure, End Stage Renal Disease, Tobacco Use, Chronic Obstructive Pulmonary Disease (COPD), Type 1 Diabetes] [N/A:N/A] Date Acquired: [3:11/15/2012] [N/A:N/A] Weeks of Treatment: [3:345] [N/A:N/A] Wound Status: [3:Open] [N/A:N/A] Measurements L x W x D 1.6x1.4x0.1 [N/A:N/A] (cm) Area (cm) : [3:1.759] [N/A:N/A] Volume (cm) : [3:0.176] [N/A:N/A] % Reduction in Area: [3:44.00%] [N/A:N/A] % Reduction in Volume: [3:72.00%] [N/A:N/A] Classification: [3:Full Thickness Without Exposed Support Structures] [N/A:N/A] Exudate Amount: [3:Medium] [N/A:N/A] Exudate Type: [3:Serosanguineous] [N/A:N/A] Exudate Color: [3:red, brown] [N/A:N/A] Wound Margin: [3:Distinct, outline attached N/A] Granulation Amount: [3:Large (67-100%)] [N/A:N/A] Granulation Quality: [3:Red, Pink] [N/A:N/A] Necrotic Amount: [3:None Present (0%)] [N/A:N/A] Exposed Structures: [3:Fat Layer (Subcutaneous  N/A Tissue) Exposed: Yes Fascia: No Tendon: No Muscle: No Joint: No Bone: No] Epithelialization: [3:Small (1-33%) Compression Therapy] [N/A:N/A N/A] Treatment Notes Electronic Signature(s) Signed: 07/22/2019 6:29:59 PM By: Linton Ham MD Signed: 07/25/2019 1:40:10 PM By: Kela Millin Entered By: Linton Ham on 07/22/2019 11:30:38 -------------------------------------------------------------------------------- [place] Details Patient Name: Date of Service: [name] 07/22/2019 10:15 AM Medical Record [id number] Patient Account Number: [id number] Date of Birth/Sex: Treating RN: 05-29-1956 (64 y.o. [name] Primary Care Provider: [name] Other Clinician: Referring Provider: Treating Provider/Extender:Robson, Tawny Asal in Treatment: [phone] Active Inactive Venous Leg Ulcer Nursing Diagnoses: Actual venous Insuffiency (use after diagnosis is confirmed) Goals: Patient will maintain optimal edema control Date Initiated: 09/04/2017 Target Resolution Date: 08/12/2019 Goal Status: Active Patient/caregiver will verbalize understanding of disease process and disease management Date Initiated: 08/03/2014 Date Inactivated: 08/28/2017 Target Resolution Date: 08/29/2017 Goal Status: Met Interventions: Compression as ordered Treatment Activities: Therapeutic compression applied : 09/21/2015 Notes: Wound/Skin Impairment Nursing Diagnoses: Impaired tissue integrity Knowledge deficit related to ulceration/compromised skin integrity Goals: Patient will have a decrease in wound volume by X% from date: (specify in notes) Date Initiated: 04/05/2015 Date Inactivated: 02/12/2017 Target Resolution Date: 09/01/2016 Unmet Reason: chronic, Goal Status: Unmet unable to tolerate stronger compression Patient/caregiver will verbalize understanding of skin care regimen Date Initiated: 04/05/2015 Target Resolution Date: 08/12/2019 Goal  Status: Active Interventions: Assess patient/caregiver ability to obtain necessary supplies Assess patient/caregiver ability to perform ulcer/skin care regimen upon admission and as needed Assess ulceration(s) every visit Provide education on ulcer and skin care Notes: Electronic Signature(s) Signed: 07/25/2019 1:40:10 PM By: Kela Millin Entered By: Kela Millin on 07/22/2019 11:18:44 -------------------------------------------------------------------------------- Pain Assessment Details Patient Name: Date of Service: HETHER, ANSELMO 07/22/2019 10:15 AM Medical Record [id number] Patient Account Number: [id number] Date of Birth/Sex: Treating RN: 1955/09/11 (63 y.o. [name] Primary Care Provider: [name] Other Clinician: Referring Provider: Treating Provider/Extender:Robson, Tawny Asal in Treatment: [phone] Active Problems Location of Pain Severity and Description of Pain Patient Has Paino Yes Site Locations Pain Location: Pain in Ulcers Rate the pain. Current Pain Level: 5 Worst Pain Level: 10 Least Pain Level: 0 Tolerable Pain Level: 8 Pain Management and Medication Current Pain Management: Medication: No Cold Application: No Rest: No Massage: No Activity: No T.E.N.S.: No Heat Application: No Leg drop or elevation: No Is the Current Pain Management Adequate: Adequate How does your wound impact your activities of daily livingo Sleep: No Bathing: No Appetite: No Relationship With Others: No Bladder Continence: No Emotions: No Bowel Continence: No Work: No Toileting: No Drive: No Dressing: No Hobbies: No Electronic Signature(s) Signed: 07/22/2019 6:28:18 PM By: Deon Pilling Entered By: Deon Pilling on 07/22/2019 10:40:01 -------------------------------------------------------------------------------- Patient/Caregiver Education Details Patient Name: Date of Service: [name] 2/19/2021andnbsp10:15  AM Medical Record [id number] Patient Account Number: [id number] Date of Birth/Gender:  Treating RN: [birth year] (64 y.o. [name] Primary Care Physician: [name] Other Clinician: Referring Physician: Treating Physician/Extender:Robson, Tawny Asal in Treatment: [phone] Education Assessment Education Provided To: Patient Education Topics Provided Wound/Skin Impairment: Methods: Explain/Verbal Responses: State content correctly Electronic Signature(s) Signed: 07/25/2019 1:40:10 PM By: Kela Millin Entered By: Kela Millin on 07/22/2019 11:19:01 -------------------------------------------------------------------------------- Wound Assessment Details Patient Name: Date of Service: JOSILYNN, LOSH 07/22/2019 10:15 AM Medical Record [id number] Patient Account Number: [id number] Date of Birth/Sex: Treating RN: 10-01-1955 (64 y.o. [name] Primary Care Provider: [name] Other Clinician: Referring Provider: Treating Provider/Extender:Robson, Tawny Asal in Treatment: 345 Wound Status Wound Number: 3 Primary Venous Leg Ulcer Etiology: Wound Location: Left Malleolus - Medial Wound Open Wounding Event: Gradually Appeared Status: Date Acquired: 11/15/2012 Comorbid Congestive Heart Failure, Peripheral Vascular Weeks Of Treatment: 345 History: Disease, Congestive Heart Failure, End Stage Clustered Wound: No Renal Disease, Tobacco Use, Chronic Obstructive Pulmonary Disease (COPD), Type 1 Diabetes Photos Wound Measurements Length: (cm) 1.6 % Reduc Width: (cm) 1.4 % Reduc Depth: (cm) 0.1 Epithel Area: (cm) 1.759 Tunnel Volume: (cm) 0.176 Underm Wound Description Full Thickness Without Exposed Support Classification: Structures Wound Distinct, outline attached Margin: Exudate Medium Amount: Exudate Serosanguineous Type: Exudate red, brown Color: Wound Bed Granulation Amount: Large  (67-100%) Granulation Quality: Red, Pink Necrotic Amount: None Present (0%) Foul Odor After Cleansing: No Slough/Fibrino No Exposed Structure Fascia Exposed: No Fat Layer (Subcutaneous Tissue) Exposed: Yes Tendon Exposed: No Muscle Exposed: No Joint Exposed: No Bone Exposed: No tion in Area: 44% tion in Volume: 72% ialization: Small (1-33%) ing: No ining: No Treatment Notes Wound #3 (Left, Medial Malleolus) 1. Cleanse With Wound Cleanser Soap and water 2. Periwound Care Barrier cream TCA Ointment 3. Primary Dressing Applied Hydrofera Blue 4. Secondary Dressing Dry Gauze Kerramax/Xtrasorb 6. Support Layer Production assistant, radio Notes hydrofera classic. foam to lateral side of foot for protection. Electronic Signature(s) Signed: 07/22/2019 5:06:31 PM By: Mikeal Hawthorne EMT/HBOT Signed: 07/25/2019 1:40:10 PM By: Kela Millin Entered By: Mikeal Hawthorne on 07/22/2019 14:51:29 -------------------------------------------------------------------------------- Vitals Details Patient Name: Date of Service: [name] 07/22/2019 10:15 AM Medical Record [id number] Patient Account Number: [id number] Date of Birth/Sex: Treating RN: 09-21-1955 (64 y.o. [name] Primary Care Provider: [name] Other Clinician: Referring Provider: Treating Provider/Extender:Robson, Tawny Asal in Treatment: 345 Vital Signs Time Taken: 10:39 Temperature (F): 98 Height (in): 68 Pulse (bpm): 59 Weight (lbs): 127 Respiratory Rate (breaths/min): 18 Body Mass Index (BMI): 19.3 Blood Pressure (mmHg): 119/59 Reference Range: 80 - 120 mg / dl Electronic Signature(s) Signed: 07/22/2019 6:28:18 PM By: Deon Pilling Entered By: Deon Pilling on 07/22/2019 10:41:27

## 2019-07-26 ENCOUNTER — Other Ambulatory Visit: Payer: Self-pay

## 2019-07-26 ENCOUNTER — Encounter: Payer: Medicare Other | Attending: Physical Medicine & Rehabilitation | Admitting: Physical Medicine & Rehabilitation

## 2019-07-26 ENCOUNTER — Encounter: Payer: Self-pay | Admitting: Physical Medicine & Rehabilitation

## 2019-07-26 VITALS — BP 108/47 | HR 58 | Temp 97.7°F | Ht 68.0 in | Wt 135.5 lb

## 2019-07-26 DIAGNOSIS — M21542 Acquired clubfoot, left foot: Secondary | ICD-10-CM | POA: Diagnosis not present

## 2019-07-26 DIAGNOSIS — R269 Unspecified abnormalities of gait and mobility: Secondary | ICD-10-CM | POA: Insufficient documentation

## 2019-07-26 DIAGNOSIS — R292 Abnormal reflex: Secondary | ICD-10-CM | POA: Insufficient documentation

## 2019-07-26 DIAGNOSIS — G249 Dystonia, unspecified: Secondary | ICD-10-CM | POA: Diagnosis not present

## 2019-07-26 NOTE — Progress Notes (Addendum)
Botox: Procedure Note Patient Name: [name] DOB: Jun 20, 1955 MRN: [id number]   Procedure: Botulinum toxin administration Guidance: EMG Diagnosis: Left equinovarus Date: 07/26/2019 Attending: Delice Lesch, MD    Trade name: Botox (onabotulinumtoxinA)  Informed consent: Risks, benefits & options of the procedure are explained to the patient (and/or family). The patient elects to proceed with procedure. Risks include but are not limited to weakness, respiratory distress, dry mouth, ptosis, antibody formation, worsening of some areas of function. Benefits include decreased abnormal muscle tone, improved hygiene and positioning, decreased skin breakdown and, in some cases, decreased pain. Options include conservative management with oral antispasticity agents, phenol chemodenervation of nerve or at motor nerve branches. More invasive options include intrathecal balcofen adminstration for appropriate candidates. Surgical options may include tendon lengthening or transposition or, rarely, dorsal rhizotomy.   History/Physical Examination: 64 y.o. 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  Given concern of CNS pathology, will trial injection, workup ongoing.  Left foot: Equinovarus contracture  Previous Treatments: Therapy/Range of motion Indication for guidance: Target active muscules  Procedure: Botulinum toxin was mixed with preservative free saline with a dilution of 1cc to 100 units. Targeted limb and muscles were identified. The skin was prepped with alcohol swabs and placement of needle tip in targeted muscle was confirmed using appropriate guidance. Prior to injection, positioning of needle tip outside of blood vessel was determined by pulling back on syringe plunger.  MUSCLE UNITS Left Med Gastroc:  200 Left Soleus:   100 Left FHL:   200  Total units used: [id number] Complications: None  Plan: RTC in 6 weeks  [name]  [name] 10:30 AM

## 2019-07-29 ENCOUNTER — Encounter (HOSPITAL_BASED_OUTPATIENT_CLINIC_OR_DEPARTMENT_OTHER): Payer: Medicare Other | Admitting: Internal Medicine

## 2019-07-29 ENCOUNTER — Other Ambulatory Visit: Payer: Self-pay

## 2019-07-29 DIAGNOSIS — L97322 Non-pressure chronic ulcer of left ankle with fat layer exposed: Secondary | ICD-10-CM | POA: Diagnosis not present

## 2019-07-29 NOTE — Progress Notes (Signed)
DAWNITA, SITTLER ([id number]) Visit Report for 07/29/2019 HPI Details Patient Name: Date of Service: MICHELINA, STUMP 07/29/2019 10:00 AM Medical Record [id number] Patient Account Number: [id number] Date of Birth/Sex: Treating RN: [birth year] (64 y.o. [name] Primary Care Provider: [name] Other Clinician: Referring Provider: Treating Provider/Extender:Wells Mabe, Tawny Asal in Treatment: 346 History of Present Illness HPI Description: the remaining wound is over the left medial ankle. Similar wound over the right medial ankle healed largely with use of Apligraf. Most recently we have been using Hydrofera Blue over this wound with considerable improvement. The patient has been extensively worked up in the past for her venous insufficiency and she is not a candidate for antireflux surgery although I have none of the details available currently. 08/24/14; considerable improvement today. About 50% of this wound areas now epithelialized. The base of the wound appears to be healthier granulation.as opposed to last week when she had deteriorated a considerable improvement 08/17/14; unfortunately the wound has regressed somewhat. The areas of epithelialization from the superior aspect are not nearly as healthy as they were last week. The patient thinks her Hydrofera Blue slipped. 09/07/14; unfortunately the area has markedly regressed in the 2 weeks since I've seen this. There is an odor surrounding erythema. The healthy granulation tissue that we had at the base of the wound now is a dusky color. The nurse reports green drainage 09/14/14; the area looks somewhat better than last week. There is less erythema and less drainage. The culture I did did not show any growth. Nevertheless I think it is better to continue the Cipro and doxycycline for a further week. The remaining wound area was debridement. 09/21/14. Wound did not require debridement last week. Still  less erythema and less drainage. She can complete her antibiotics. The areas of epithelialization in the superior aspect of the wound do not look as healthy as they did some weeks ago 10/05/14 continued improvement in the condition of this wound. There is advancing epithelialization. Less aggressive debridement required 10/19/14 continued improvement in the condition and volume of this wound. Less aggressive debridement to the inferior part of this to remove surface slough and fibrinous eschar 11/02/14 no debridement is required. The surface granulation appears healthy although some of her islands of epithelialization seem to have regressed. No evidence of infection 11/16/14; lites surface debridement done of surface eschar. The wound does not look to be unhealthy. No evidence of infection. Unfortunately the patient has had podiatry issues in the right foot and for some reason has redeveloped small surface ulcerations in the medial right ankle. Her original presentation involved wounds in this area 11/23/14 no debridement. The area on the right ankle has enlarged. The left ankle wound appears stable in terms of the surface although there is periwound inflammation. There has been regression in the amount of new skin 11/30/14 no debridement. Both wound areas appear healthy. There was no evidence of infection. The the new area on the right medial ankle has enlarged although that both the surfaces appear to be stable. 12/07/14; Debridement of the right medial ankle wound. No no debridement was done on the left. 12/14/14 no major change in and now bilateral medial ankle wounds. Both of these are very painful but the no overt evidence of infection. She has had previous venous ablation 12/21/14; patient states that her right medial ankle wound is considerably more painful last week than usual. Her left is also somewhat painful. She could not tolerate debridement. The right medial ankle  wound has fibrinous  surface eschar 12/28/14 this is a patient with severe bilateral venous insufficiency ulcers. For a considerable period of time we actually had the one on the right medial ankle healed however this recently opened up again in June. The left medial ankle wound has been a refractory area with some absent flows. We had some success with Hydrofera Blue on this area and it literally closed by 50% however it is recently opened up Foley. Both of these were debridement today of surface eschar. She tolerates this poorly 01/25/15: No change in the status of this. Thick adherent escar. Very poor tolerance of any attempt at debridement. I had healed the right medial malleolus wound for a considerable amount of time and had the left one down to about 50% of the volume although this is totally regressed over the last 48 weeks. Further the right leg has reopened. she is trying to make a appointment with pain and vascular, previous ablations with Dr. Aleda Grana. I do not believe there is an arterial insufficiency issue here 02/01/15 the status of the adherent eschar bilaterally is actually improved. No debridement was done. She did not manage to get vascular studies done 02/08/15 continued debridement of the area was done today. The slough is less adherent and comes off with less pressure. There is no surrounding infection peripheral pulses are intact 02/15/15 selective debridement with a disposable curette. Again the slough is less adherent and comes off with less difficulty. No surrounding infection peripheral pulses are intact. 02/22/15 selective debridement of the right medial ankle wound. Slough comes off with less difficulty. No obvious surrounding infection peripheral pulses are intact I did not debridement the one on the left. Both of these are stable to improved 03/01/15 selective debridement of both wound areas using a curette to. Adherent slough cup soft with less difficulty. No obvious surrounding  infection. The patient tells me that 2 days ago she noted a rash above the right leg wrap. She did not have this on her lower legs when she change this over she arrives with widespread left greater than right almost folliculitis-looking rash which is extremely pruritic. I don't see anything to culture here. There is no rash on the rest of her body. She feels well systemically. 03/08/15; selective debridement of both wounds using a curette. Base of this does not look unhealthy. She had limegreen drainage coming out of the left leg wound and describes a lot of drainage. The rash on her left leg looks improved to. No cultures were done. 03/22/15; patient was not here last week. Basal wounds does not look healthy and there is no surrounding erythema. No drainage. There is still a rash on the left leg that almost looks vasculitic however it is clearly limited to the top of where the wrap would be. 04/05/15; on the right required a surgical debridement of surface eschar and necrotic subcutaneous tissue. I did not debridement the area on the left. These continue to be large open wounds that are not changing that much. We were successful at one point in healing the area on the right, and at the same time the area on the left was roughly half the size of current measurements. I think a lot of the deterioration has to do with the prolonged time the patient is on her feet at work 04/19/15 I attempted-like surface debridement bilaterally she does not tolerate this. She tells me that she was in allergic care yesterday with extreme pain over her left lateral  malleolus/ankle and was told that she has an "sprain" 05/03/15; large bilateral venous insufficiency wounds over the medial malleolus/medial aspect of her ankles. She complains of copious amounts of drainage and his usual large amounts of pain. There is some increasing erythema around the wound on the right extending into the medial aspect of her foot to.  historically she came in with these wounds the right one healed and the left one came down to roughly half its current size however the right one is reopened and the left is expanded. 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 05/10/15 large bilateral venous insufficiency wounds. There is less adherence surface left however the surface culture that I did last week grew pseudomonas therefore bilateral selective debridement score necessary. There is surrounding erythema. The patient describes severe bilateral drainage and a lot of pain in the left ankle. Apparently her podiatrist was were ready to do a cortisone shot 05/17/15; the patient complains of pain and again copious amounts of drainage. 05/24/15; we used Iodo flex last week. Patient notes considerable improvement in wound drainage. Only needed to change this once. 05/31/15; we continued Iodoflex; the base of these large wounds bilaterally is not too bad but there is probably likely a significant bioburden here. I would like to debridement just doesn't tolerate it. 06/06/14 I would like to continue the Iodoflex although she still hasn't managed to obtain supplies. She has bilateral medial malleoli or large wounds which are mostly superficial. Both of them are covered circumferentially with some nonviable fibrinous slough although she tolerates debridement very poorly. She apparently has an appointment for an ablation on the right leg by interventional radiology. 06/14/15; the patient arrives with the wounds and static condition. We attempted a debridement although she does not do well with this secondary to pain. I 07/05/15; wounds are not much smaller however there appears to be a cleaner granulating base. The left has tight fibrinous slough greater than the right. Debridement is tolerated poorly due to pain. Iodoflex is done more for these wounds in any of the multitude of different dressings I  have tried on the left 1 and then subsequently the right. 07/12/15; no change in the condition of this wound. I am able to do an aggressive debridement on the right but not the left. She simply cannot tolerate it. We have been using Iodoflex which helps somewhat. It is worthwhile remembering that at one point we healed the right medial ankle wound and the left was about 25% of the current circumference. We have suggested returning to vascular surgery for review of possible further ablations for one reason or another she has not been able to do this. 07/26/15 no major change in the condition of either wound on her medial ankle. I did not attempt to debridement of these. She has been aggressively scrubbing these while she is in the shower at home. She has her supply of Iodoflex which seems to have done more for these wounds then anything I have put on recently. 08/09/15 wound area appears larger although not verified by measurements. Using Iodoflex 09/05/2015 -- she was here for avisit today but had significant problems with the wound and I was asked to see her for a physician opinion. I have summarize that this lady has had surgery on her left lower extremity about 10 years ago where the possible veins stripping was done. She has had an opinion from interventional radiology  around November 2016 where no further sclerotherapy was ordered. The patient works 12 hours a day and stands on a concrete floor with work boots and is unable to get the proper compression she requires and cannot elevate her limbs appropriately at any given time. She has recently grown Pseudomonas from her wound culture but has not started her ciprofloxacin which was called in for her. 09/13/15 this continues to be a difficult situation for this patient. At one point I had this wound down to a 1.5 x 1.5" wound on her left leg. This is deteriorated and the right leg has reopened. She now has substantial wounds on her medial calcaneus,  malleoli and into her lower leg. One on the left has surface eschar but these are far too painful for me to debridement here. She has a vascular surgery appointment next week to see if anything can be done to help here. I think she has had previous ablations several years ago at [facility]. She has no major edema. She tells me that she did not get product last time [facility] and went for several days without it. She continues to work in work boots 12 hours a day. She cannot get compression/4-layer under her work boots. 09/20/15 no major change. Periwound edema control was not very good. Her point with pain and vascular is next Wednesday the 25th 09/28/15; the patient is seen vascular surgery and is apparently scheduled for repeat duplex ultrasounds of her bilateral lower legs next week. 10/05/15; the patient was seen by Dr. Doren Custard of vascular surgery. He feels that she should have arterial insufficiency excluded as cause/contributed to her nonhealing stage she is therefore booked for an arteriogram. She has apparently monophasic signals in the dorsalis pedis pulses. She also of course has known severe chronic venous insufficiency with previous procedures as noted previously. I had another long discussion with the patient today about her continuing to work 12 hour shifts. I've written her out for 2 months area had concerns about this as her work location is currently undergoing significant turmoil and this may lead to her termination. She is aware of this however I agree with her that she simply cannot continue to stand for 12 hours multiple days a week with the substantial wound areas she has. 10/19/15; the Dr. Doren Custard appointment was largely for an arteriogram which was normal. She does not have an arterial issue. He didn't make a comment about her chronic venous insufficiency for which she has had previous ablations. Presumably it was not felt that anything additional could be done. The patient is  now out of work as I prescribed 2 weeks ago. Her wounds look somewhat less aggravated presumably because of this. I felt I would give debridement another try today 10/25/15; no major change in this patient's wounds. We are struggling to get her product that she can afford into her own home through her insurance. 11/01/15; no major change in the patient's wounds. I have been using silver alginate as the most affordable product. I spoke to Dr. Marla Roe last week with her requested take her to the OR for surgical debridement and placement of ACEL. Dr. Marla Roe told me that she would be willing to do this however [facility] will not cover this, fortunately the patient has [place] healthcare of some variant 11/08/15; no major change in the patient's wounds. She has been completely nonviable surface that this but is in too much pain with any attempted debridement are clinic. I have  arranged for her to see Dr. Marla Roe ham of plastic surgery and this appointment is on Monday. I am hopeful that they will take her to the OR for debridement, possible ACEL ultimately possible skin graft 11/22/15 no major change in the patient's wounds over her bilateral medial calcaneus medial malleolus into the lower legs. Surface on these does not look too bad however on the left there is surrounding erythema and tenderness. This may be cellulitis or could him sleepy tinea. 11/29/15; no major changes in the patient's wounds over her bilateral medial malleolus. There is no infection here and I don't think any additional antibiotics are necessary. There is now plan to move forward. She sees Dr. Marla Roe in a week's time for preparation for operative debridement and ACEL placement I believe on 7/12. She then has a follow-up appointment with Dr. Marla Roe on 7/21 12/28/15; the patient returns today having been taken to the [place] by Dr. Marla Roe 12/12/15 she underwent debridement, intraoperative cultures [which were  negative]. She had placement of a wound VAC. Parent really ACEL was not available to be placed. The wound VAC foam apparently adhered to the wound since then she's been using silver alginate, Xeroform under Ace wraps. She still says there is a lot of drainage and a lot of pain 01/31/16; this is a patient I see monthly. I had referred her to Dr. Marla Roe him of plastic surgery for large wounds on her bilateral medial ankles. She has been to the OR twice once in early July and once in early August. She tells me over the last 3 weeks she has been using the wound VAC with ACEL underneath it. On the right we've simply been using silver alginate. Under Kerlix Coban wraps. 02/28/16; this is a patient I'm currently seeing monthly. She is gone on to have a skin graft over her large venous insufficiency ulcer on the left medial ankle. This was done by Dr. Marla Roe him. The patient is a bit perturbed about why she didn't have one on her right medial ankle wound. She has been using silver alginate to this. 03/06/16; I received a phone call from her plastic surgery Dr. Marla Roe. She expressed some concern about the viability of the skin graft she did on the left medial ankle wound. Asked me to place Endoform on this. She told me she is not planning to do a subsequent skin graft on the right as the left one did not take very well. I had placed Hydrofera Blue on the right 03/13/16; continue to have a reasonably healthy wound on the right medial ankle. Down to 3 mm in terms of size. There is epithelialization here. The area on the left medial ankle is her skin graft site. I suppose the last week this looks somewhat better. She has an open area inferiorly however in the center there appears to be some viable tissue. There is a lot of surface callus and eschar that will eventually need to come off however none of this looked to be infected. Patient states that the is able to keep the dressing on for several days  which is an improvement. 03/20/16 no major change in the circumference of either wound however on the left side the patient was at Dr. Eusebio Friendly office and they did a debridement of left wound. 50% of the wound seems to be epithelialized. I been using Endoform on the left Hydrofera Blue in the right 03/27/16; she arrives today with her wound is not looking as healthy as they did  last week. The area on the right clearly has an adherent surface to this a very similar surface on the left. Unfortunately for this patient this is all too familiar problem. Clearly the Endoform is not working and will need to change that today that has some potential to help this surface. She does not tolerate debridement in this clinic very well. She is changing the dressing wants 04/03/16; patient arrives with the wounds looking somewhat better especially on the right. Dr. Migdalia Dk change the dressing to silver alginate when she saw her on Monday and also sold her some compression socks. The usefulness of the latter is really not clear and woman with severely draining wounds. 04/10/16; the patient is doing a bit of an experiment wearing the compression stockings that Dr. Migdalia Dk provided her to her left leg and the out of legs based dressings that we provided to the right. 05/01/16; the patient is continuing to wear compression stockings Dr. Migdalia Dk provided her on the left that are apparently silver impregnated. She has been using Iodoflex to the right leg wound. Still a moderate amount of drainage, when she leaves here the wraps only last for 4 days. She has to change the stocking on the left leg every night 05/15/16; she is now using compression stockings bilaterally provided by Dr. Marla Roe. She is wearing a nonadherent layer over the wounds so really I don't think there is anything specific being done to this now. She has some reduction on the left wound. The right is stable. I think all healing here is being done  without a specific dressing 06/09/16; patient arrives here today with not much change in the wound certainly in diameter to large circular wounds over the medial aspect of her ankle bilaterally. Under the light of these services are certainly not viable for healing. There is no evidence of surrounding infection. She is wearing compression stockings with some sort of silver impregnation as prescribed by Dr. Marla Roe. She has a follow-up with her tomorrow. 06/30/16; no major change in the size or condition of her wounds. These are still probably covered with a nonviable surface. She is using only her purchase stockings. She did see Dr. Marla Roe who seemed to want to apply Dakin's solution to this I'm not extreme short what value this would be. I would suggest Iodoflex which she still has at home. 07/28/16; I follow Mrs. Guess episodically along with Dr. Marla Roe. 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 She has been applying a topical collagen ointment to both wounds with Adaptic. I don't think Dr. Marla Roe is planning to take her back to the OR. 08/19/16; I follow Mrs. Jeneen Rinks on a monthly basis along with Dr. Marla Roe of plastic surgery. 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 I been following her for a number of years. At one point I was able to get the right medial malleolus wound to heal and had the left medial malleolus down to about half its current size however and I had to send her to plastic surgery for an operative debridement. Since then things have been stable to slightly improve the area on the right is slightly better one in the left about the same although there is much less adherent surface than I'm used to with this patient. She is using some form of liquid collagen gel that Dr. Marla Roe provided a Kerlix cover with the patient's own  pressure stockings. She tells me that she has extreme pain in both ankles and along the lateral aspect of both feet. She has been unable to work for some period of time. She is telling me she is retiring at the beginning of April. She sees Dr. Doran Durand of orthopedics next week 09/22/16; patient has not seen Dr. Marla Roe since the last time she is here. I'm not really sure what she is using to the wounds other than bits and pieces of think she had left over including most recently Hydrofera Blue. She is using juxtalite stockings. She is having difficulty with her husband's recent illness "stroke". She is having to transport him to various doctors appointments. Dr. Marla Roe left her the option of a repeat debridement with ACEL however she has not been able to get the time to follow-up on this. She continues to have a fair amount of drainage out of these wounds with certainly precludes leaving dressings on all week 10/13/16; patient has not seen Dr. Marla Roe since she was last in our clinic. I'm not really sure what she is doing with the wounds, we did try to get her [facility] and I think she is actually using this most of the time. Because of drainage she states she has to change this every second day although this is an improvement from what she used to do. She went to see Dr. Doran Durand who did not think she had a muscular issue with regards to her feet, he referred her to a neurologist and I think the appointment is sometime in June. I changed her back to Iodoflex which she has used in the past but not recently. 11/03/16; the patient has been using Iodoflex although she ran out of this. Still claims that there is a lot of drainage although the wound does not look like this. No surrounding erythema. She has not been back to see Dr. Marla Roe 11/24/16; the patient has been using Iodoflex again but she ran out of it 2 or 3 days ago. There is no major change in the condition of either one of these  wounds in fact they are larger and covered in a thick adherent surface slough/nonviable tissue especially on the left. She does not tolerate mechanical debridement in our clinic. Going back to see Dr. Marla Roe of plastic surgery for an operative debridement would seem reasonable. 12/15/16; the patient has not been back to see Dr. Marla Roe. She is been dealing with a series of illnesses and her husband which of monopolized her time. She is been using Sorbact which we largely supplied. She states the drainage is bad enough that it maximum she can go 2-3 days without changing the dressing 01/12/2017 -- the patient has not been back for about 4 weeks and has not seen Dr. Marla Roe not does she have any appointment pending. 01/23/17; patient has not seen Dr. Marla Roe even though I suggested this previously. She is using Santyl that was suggested last week by Dr. Con Memos this Cost her $16 through her insurance which is indeed surprising 02/12/17; continuing Santyl and the patient is changing this daily. A lot of drainage. She has not been back to see plastic surgery she is using an Ace wrap. Our intake nurse suggested wrap around stockings which would make a good reasonable alternative 02/26/17; patient is been using Santyl and changing this daily due to drainage. She has not been to see plastic surgery she uses in April Ace wrap to control the edema. She did obtain extremitease stockings  but stated that the edema in her leg was to big for these 03/20/17; patient is using Santyl and Anasept. Surfaces looked better today the area on the right is actually measuring a little smaller. She has states she has a lot of pain in her feet and ankles and is asking for a consult to pain control which I'll try to help her with through our case manager. 04/10/17; the patient arrives with better-looking wound surfaces and is slightly smaller wound on the left she is using a combination of Santyl and Anasept. She has  an appointment or at least as started in the pain control center associated with [facility] 05/14/17; this is a patient who I followed for a prolonged period of time. 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 At one point I had this down to a much smaller wound on the left however these reopened and we've never been able to get these to heal. She has been using Santyl and Anasept gel although 2 weeks ago she ran out of the Anasept gel. She has a stable appearance of the wound. She is going to the wound care clinic at [facility]. They wanted do a nerve block/spinal block although she tells me she is reluctant to go forward with that. 05/21/17; this is a patient I have followed for many years. 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 Chronic pain and deformity in her ankles as well. She is been to see plastic surgery as well as orthopedics. Using PolyMem AG most recently/Kerramax/ABDs and 2 layer compression. She has managed to keep this on and she is coming in for a nurse check to change the dressing on Tuesdays, we see her on Fridays 06/05/17; really quite a good looking surface and the area especially on the right medial has contracted in terms of dimensions. Well granulated healthy-looking tissue on both sides. Even with an open curet there is nothing that even feels abnormal here. This is as good as I've seen this in quite some time. We have been using PolyMem AG and bringing her in for a nurse check 06/12/17; really quite good surface on both of these wounds. The right medial has contracted a bit left is not. We've been using PolyMem and AG and she is coming in for a nurse visit 06/19/17; we have been using PolyMem AG and bringing her in for a nurse check. Dimensions of her wounds are not better but the surfaces looked better bilaterally. She complained of bleeding last night and the left wound and increasing pain bilaterally. She  states her wound pain is more neuropathic than just the wounds. There was some suggestion that this was radicular from her pain management doctor in talking to her it is really difficult to sort this out. 06/26/17; using PolyMem and AG and bringing her in for a nurse check as All of this and reasonably stable condition. Certainly not improved. The dimensions on the lateral part of the right leg look better but not really measuring better. The medial aspect on the left is about the same. 07/03/16; we have been using PolyMen AG and bringing her in for a nurse check to change the dressings as the wounds have drainage which precludes once weekly changing. We are using all secondary absorptive dressings.our intake nurse is brought up the idea of using a wound VAC/snap VAC on the wound to help with the drainage to see if this would result in some contraction. This is  not a bad idea. The area on the right medial is actually looking smaller. Both wounds have a reasonable-looking surface. There is no evidence of cellulitis. The edema is well controlled 07/10/17; the patient was denied for a snap VAC by her insurance. The major issue with these wounds continues to be drainage. We are using wicked PolyMem AG and she is coming in for a nurse visit to change this. The wounds are stable to slightly improved. The surface looks vibrant and the area on the right certainly has shrunk in size but very slowly 07/17/17; the patient still has large wounds on her bilateral medial malleoli. Surface of both of these wounds looks better. The dimensions seem to come and go but no consistent improvement. There is no epithelialization. We do not have options for advanced treatment products due to insurance issues. They did not approve of the wound VAC to help control the drainage. More recently we've been using PolyMem and AG wicked to allow drainage through. We have been bringing her in for a nurse visit to change this. We do not  have a lot of options for wound care products and the home again due to insurance issues 07/24/17; the patient's wound actually looks somewhat better today. No drainage measurements are smaller still healthy-looking surface. We used silver collagen under PolyMen started last week. We have been bringing her in for a dressing change 07/31/17; patient's wound surface continued to look better and I think there is visible change in the dimensions of the wound on the right. Rims of epithelialization. We have been using silver collagen under PolyMen and bringing her in for a dressing change. There appears to be less drainage although she is still in need of the dressing change 08/07/17. Patient's wound surface continues to look better on both sides and the area on the right is definitely smaller. We have been using silver collagen and PolyMen. She feels that the drainage has been it has been better. I asked her about her vascular status. She went to see Dr. Aleda Grana at [facility] and had some form of ablation. I don't have much detail on this. I haven't my notes from 2016 that she was not a candidate for any further ablation but I don't have any more information on this. We had referred her to vein and vascular I don't think she ever went. He does not have a history of PAD although I don't have any information on this either. We don't even have ABIs in our record 08/14/17; we've been using silver collagen and PolyMen cover. And putting the patient and compression. She we are bringing her in as a nurse visit to change this because ofarge amount of drainage. We didn't the ABIs in clinic today since they had been done in many moons 1.2 bilaterally. She has been to see vein and vascular however this was at [facility] and she had ablation although I really don't have any information on this all seemed biking get a report. She is also been operatively debrided by plastic surgery and had a cell placed  probably 8-12 months ago. This didn't have a major effect. We've been making some gains with current dressings 08/19/17-She is here in follow-up evaluation for bilateral medial malleoli ulcers. She continues to tolerate debridement very poorly. We will continue with recently changed topical treatment; if no significant improvement may consider switching to Iodosorb/Iodoflex. 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 She has been using silver  collagen and PolyMem. I believe she has been used and tried on Iodoflex before. 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 These have been very refractory over time. We have been using silver collagen and PolyMen. She is coming in once a week for a doctor's and once a week for nurse visits. We are actually making some progress 09/18/17; the patient's wounds are smaller especially on the right medial. She arrives today to upset to consider even washing these off with Anasept which I think is been part of the reason this is been closing. We've been using collagen covered in PolyMen otherwise. It is noted that she has a small area of folliculitis on the right medial calf that. As we are wrapping her legs I'll give her a short course of doxycycline to make sure this doesn't amount to anything. She is a long list of complaints today including imbalance, shortness of breath on exertion, inversion of her left ankle. With regards to the latter complaints she is been to see orthopedics and they offered her a tendon release surgery I believe but wanted her wounds to be closed first. I have recommended she go see her primary physician with regards to everything else. 09/25/17; patient's wounds are about the same size. We have made  some progress bilaterally although not in recent weeks. She will not allow me T wash these wounds with Anasept even if she is doing her cell. Wheeze we've been using collagen covered in PolyMen. Last week she had a small area of folliculitis this is now opened into a small wound. She completed 5 days of trimethoprim sulfamethoxazole 10/02/17; unfortunately the area on her left medial ankle is worse with a larger wound area towards the Achilles. The patient complains of a lot of pain. She will not allow debridement although visually I don't think there is anything to debridement in any case. We have been using silver collagen and PolyMen for several months now. Initially we are making some progress although I'm not really seeing that today. We will move back to [facility]. His admittedly this is a bit of a repeat however I'm hoping that his situation is different now. The patient tells me she had her leg on the left give out on her yesterday this is process some pain. 10/09/17; the patient is seen twice a week largely because of drainage issues coming out of the chronic medial bimalleolar wounds that are chronic. Last week the dimensions of the one on the left looks a little larger I changed her to [facility]. She comes in today with a history of terrible pain in the bilateral wound areas. She will not allow debridement. She will not even allow a tissue culture. There is no surrounding erythema no no evidence of cellulitis. We have been putting her Kerlix Coban man. She will not allow more aggressive compression as there was a suggestion to put her in 3 layer wraps. 10/16/17; large wounds on her bilateral medial malleoli. These are chronic. Not much change from last week. The surface looks have healthy but absolutely no epithelialization. A lot of pain little less so of drainage. She will not allow debridement or even washing these off in the vigorous fashion with Anasept. 10/23/17; large wounds  on her bilateral malleoli which are chronic. Some improvement in terms  of size perhaps on the right since last time I saw these. She states that after we increased the 3 layer compression there was some bleeding, when she came in for a nurse visit she did not want 3 layer compression put back on about our nurse managed to convince her. She has known chronic venous visit issues and I'm hoping to get her to tolerate the 3 layer compression. using Hydrofera Blue 10/30/17; absolutely no change in the condition of either wound although we've had some improvement in dimensions on the right.. Attempted to put her in 3 layer compression she didn't tolerated she is back in 2 layer compression. We've been using Hydrofera Blue We looked over her past records. She had venous reflux studies in November 2016. There was no evidence of deep venous reflux on the right. Superficial vein did not show the greater saphenous vein at think this is been previously ablated the small saphenous vein was within normal limits. The left deep venous system showed no DVT the vessels were positive for deep venous reflux in the posterior tibial veins at the ankle. The greater saphenous vein was surgically absent small saphenous vein was within normal limits. She went to vein and vascular at [facility]. I believe she had an ablation on the left greater saphenous vein. I'll update her reflux studies perhaps ever reviewed by vein and vascular. We've made absolutely no progress in these wounds. Will also try to read and TheraSkins through her insurance 11/06/17; W the patient apparently has a 2 week follow-up with vein and vascular I like him to review the whole issue with regards to her previous vascular workup by Dr. Aleda Grana. We've really made no progress on these wounds in many months. She arrives today with less viable looking surface on the left medial ankle wound. This was apparently looking about the same on Tuesday when  she was here for nurse visit. 11/13/17; deep tissue culture I did last time of the left lower leg showed multiple organisms without any predominating. In particular no Staphylococcus or group A strep were isolated. We sent her for venous reflux studies. She's had a previous left greater saphenous vein stripping and I think sclerotherapy of the right greater saphenous vein. She didn't really look at the lesser saphenous vein this both wounds are on the medial aspect. She has reflux in the common femoral vein and popliteal vein and an accessory vein on the right and the common femoral vein and popliteal vein on the left. I'm going to have her go to see vein and vascular just the look over things and see if anything else beside aggressive compression is indicated here. We have not been able to make any progress on these wounds in spite of the fact that the surface of the wounds is never look too bad. 11/20/17; no major change in the condition of the wounds. Patient reports a large amount of drainage. She has a lot of complaints of pain although enlisting her today I wonder if some of this at least his neuropathic rather than secondary to her wounds. She has an appointment with vein and vascular on 12/30/17. The refractory nature of these wounds in my mind at least need vein and vascular to look over the wounds the recent reflux studies we did and her history to see if anything further can be done here. I also note her gait is deteriorated quite a bit. 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 She has  a bilateral Trendelenburg gait. I wonder if this is neuropathic or perhaps multilevel radicular. 11/27/17; her wounds actually looks slightly better. Healthy-looking granulation tissue a scant amount of epithelialization. [place] healthcare will not pay for [employer]. They will play for tri layer Oasis and Dermagraft. This is not a diabetic ulcer. We'll try for the tri layer Oasis. She still  complains of some drainage. She has a vein and vascular appointment on 12/30/17 12/04/17; the wounds visually look quite good. Healthy-looking granulation with some degree of epithelialization. We are still waiting for response to our request for trial to try layer Oasis. Her appointment with vascular to review venous and arterial issues isn't sold the end of July 7/31. Not allow debridement or even vigorous cleansing of the wound surface. 12/18/17; slightly smaller especially on the right. 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 We've been using Hydrofera Blue. We still are looking into triple layer Oasis through her insurance 01/08/18 on evaluation today patient's wound actually appears to be showing signs of good improvement at this point in time. She has been tolerating the dressing changes without complication. Fortunately there does not appear to be any evidence of infection at this point in time. We have been utilizing silver nitrate which does seem to be of benefit for her which is also good news. Overall I'm very happy with how things seem to be both regards appearance as well as measurement. Patient did see Dr. Bridgett Larsson for evaluation on 12/30/17. In his assessment he felt that stripping would not likely add much more than chronic compression to the patient's healing process. His recommendation was to follow-up in three months with Dr. Doren Custard if she hasn't healed in order to consider referral back to you and see vascular where she previously was in a trial and was able to get her wound to heal. I'll be see what she feels she when you staying compression and he reiterated this as well. 01/13/18 on evaluation today patient appears to actually be doing very well in regard to her bilateral medial malleolus ulcers. She seems to have tolerated the chemical cauterization with silver nitrate last week she did have some pain through that evening but fortunately states that I'll  be see since it seems to be doing better she is overall pleased with the progress. 01/21/18; really quite a remarkable improvement since I've last seen these wounds. We started using silver nitrate specially on the islands of hyper granulation which for some reason her around the wound circumference. This is really done quite nicely. Primary dressing Hydrofera Blue under 4 layer compression. She seems to be able to hold out without a nurse rewrap. Follow-up in 1 week 01/28/18; we've continued the hydrofera blue but continued with chemical cauterization to the wound area that we started about a month ago for irregular hyper granulation. She is made almost stunning improvement in the overall wound dimensions. I was not really expecting this degree of improvement in these chronic wounds 02/05/18; we continue with Hydrofera Bluebut of also continued the aggressive chemical cauterization with silver nitrate. We made nice progress with the right greater than left wound. 02/12/18. We continued with Hydrofera Blue after aggressive chemical cauterization with silver nitrate. We appear to be making nice progress with both wound areas 02/19/2018; we continue with [facility] after washing the wounds vigorously with Anasept spray and chemical cauterization with silver nitrate. We are making excellent progress. The area on the right's just about closed 02/26/2018. The area on the left medial  ankle had too much necrotic debris today. I used a #5 curette we are able to get most of the soft. I continued with the silver nitrate to the much smaller wound on the right medial ankle she had a new area on her right lower pretibial area which she says was due to a role in her compression 03/05/2018; both wound areas look healthy. Not much change in dimensions from last week. I continue to use silver nitrate and Hydrofera Blue. The patient saw Dr. Doren Custard of vein and vascular. He felt she had venous stasis ulcers. He felt  based on her previous arteriogram she should have adequate circulation for healing. Also she has deep venous reflux but really no significant correctable superficial venous reflux at this time. He felt we should continue with conservative management including leg elevation and compression 04/02/2018; since we last saw this woman about a month ago she had a fall apparently suffered a pelvic fracture. I did not look up the x-ray. Nevertheless because of pain she literally was bedbound for 2 weeks and had home health coming out to change the dressing. Somewhat predictably this is resulted in considerable improvement in both wound areas. The right is just about closed on the medial malleolus and the left is about half the size. 04/16/2018; both her wounds continue to go down in size. Using Hydrofera Blue. 05/07/18; both her wounds appeared to be improving especially on the right where it is almost closed. We are using Hydrofera Blue 05/14/2018; slightly worse this week with larger wounds. Surface on the left medial not quite as good. We have been using Hydrofera Blue 05/21/18; again the wounds are slightly larger. Left medial malleolus slightly larger with eschar around the circumference. We have been using Hydrofera Blue undergoing a wraps for a prolonged period of time. This got a lot better when she was more recumbent due to a fall and a back injury. I change the primary dressing the silver alginate today. She did not tolerate a 4 layer compression previously although I may need to bring this up with her next time 05/28/2018; area on the left medial malleolus again is slightly larger with more drainage. Area on the right is roughly unchanged. She has a small area of folliculitis on the right medial just on the lower calf. This does not look ominous. 06/03/2018 left medial malleolus slightly smaller in a better looking surface. We used silver nitrate on this last time with silver alginate. The area  on the right appears slightly smaller 1/10; left medial malleolus slightly smaller. Small open area on the right. We used silver nitrate and silver alginate as of 2 weeks ago. We continue with the wound and compression. These got a lot better when she was off her feet 1/17; right medial malleolus wound is smaller. The left may be slightly smaller. Both surfaces look somewhat better. 1/24; both wounds are slightly smaller. Using silver alginate under Unna boots 1/31; both wounds appear smaller in fact the area on the right medial is just about closed. Surface eschar. We have been using silver alginate under Unna boots. The patient is less active now spends let much less time on her feet and I think this is contributed to the general improvement in the wound condition 2/7; both wounds appear smaller. I was hopeful the right medial would be closed however there there is still the same small open area. Slight amount of surface eschar on the left the dimensions are smaller there is eschar but  the wound edges appear to be free. We have been using silver alginate under Unna boot's 2/14; both wounds once again measure smaller. Circumferential eschar on the left medial. We have been using silver alginate under Unna boots with gradual improvement 2/21; the area on the right medial malleolus has healed. The area on the left is smaller. We have been using silver alginate and Unna boots. We can discharge wrapping the right leg she has 20/30 stockings at home she will need to protect the scar tissue in this area 2/28; the area on the right medial malleolus remains closed the patient has a compression stocking. The area on the left is smaller. We have been using silver alginate and Unna boots. 3/6 the area on the right medial ankle remains closed. Good edema control noted she is using her own compression stocking. The area on the left medial ankle is smaller. We have been managing this with silver alginate and  Unna boots which we will continue today. 3/13; the area on the right medial ankle remains closed and I'm declaring it healed today. When necessary the left is about the same still a healthy-looking surface but no major change and wound area. No evidence of infection and using silver alginate under unna and generally making considerable improvement 3/27 the area on the right medial ankle remains closed the area on the left is about the same as last week. Certainly not any worse we have been using silver alginate under an Unna boot 4/3; the area on the right medial ankle remains closed per the patient. We did not look at this wound. The wound on the left medial ankle is about the same surface looks healthy we have been using silver alginate under an Unna boot 4/10; area on the right medial ankle remains closed per the patient. We did not look at this wound. The wound on the left medial ankle is slightly larger. The patient complains that the [facility] caused burning pain all week. She also told us that she was a lot more active this week. Changed her back to silver alginate 4/17; right medial ankle still closed per the patient. Left medial ankle is slightly larger. Using silver alginate. She did not tolerate Hydrofera Blue on this area 4/24; right medial ankle remains closed we have not look at this. The left medial ankle continues to get larger today by about a centimeter. We have been using silver alginate under Unna boots. She complains about 4 layer compression as an alternative. She has been up on her feet working on her garden 5/8; right medial ankle remains closed we did not look at this. The left medial ankle has increased in size about 100%. We have been using silver alginate under Unna boots. She noted increased pain this week and was not surprised that the wound is deteriorated 5/15; no major change in SA however much less erythema ( one week of doxy ocellulitis). 5/22-64 year old  female returns at 1 week to the clinic for left medial ankle wound for which we have been using silver alginate under 3 layer compression She was placed on DOXY at last visit - the wound is wider at this visit. She is in 3 layer compression 5/29; change to [facility] last week. I had given her empiric doxycycline 2 weeks ago for a week. She is in 3 layer compression. She complains of a lot of pain and drainage on presentation today. 6/5; using Hydrofera Blue. I gave her doxycycline recently empirically for  erythema and pain around the wound. Believe her cultures showed enterococcus which not would not have been well covered by doxycycline nevertheless the wound looks better and I don't feel specifically that the enterococcus needs to be covered. She has a new what looks like a wrap injury on her lateral left ankle. 6/12; she is using Hydrofera Blue. She has a new area on the left anterior lower tibial area. This was a wrap injury last week. 6/19; the patient is using Hydrofera Blue. She arrived with marked inflammation and erythema around the wound and tenderness. 12/01/18 on evaluation today patient appears to be doing a little bit better based on what I'm hearing from the standpoint of lassos evaluation to this as far as the overall appearance of the wound is concerned. Then sometime substandard she typically sees Dr. Dellia Nims. Nonetheless overall very pleased with the progress that she's made up to this point. No fevers, chills, nausea, or vomiting noted at this time. 7/10; some improvement in the surface area. Aggressively debrided last week apparently. I went ahead with the debridement today although the patient does not tolerate this very well. We have been using Iodoflex. Still a fair amount of drainage 7/17; slightly smaller. Using Iodoflex. 7/24; no change from last week in terms of surface area. We have been using Iodoflex. Surface looks and continues to look somewhat better 7/31;  surface area slightly smaller better looking surface. We have been using Iodoflex. This is under Unna boot compression 8/7-Patient presents at 1 week with Unna boot and Iodoflex, wound appears better 8/14-Patient presents at 1 week with Iodoflex, we use the Unna boot, wound appears to be stable better.Patient is getting Botox treatment for the inversion of the foot for tendon release, Next week 8/21; we are using Iodoflex. Unna boot. The wound is stable in terms of surface area. Under illumination there is some areas of the wound that appear to be either epithelialized or perhaps this is adherent slough at this point I was not really clear. It did not wipe off and I was reluctant to debride this today. 8/28; we are using Iodoflex in an Unna boot. Seems to be making good improvement. 9/4; using Iodoflex and wound is slightly smaller. 9/18; we are using Iodoflex with topical silver nitrate when she is here. The wound continues to be smaller 10/2; patient missed her appointment last week due to GI issues. She left and Iodoflex based dressing on for 2 weeks. Wound is about the same size about the size of a dime on the left medial lower 10/9 we have been using Iodoflex on the medial left ankle wound. She has a new superficial probable wrap injury on the dorsal left ankle 10/16; we have been using Hydrofera Blue since last week. This is on the left medial ankle 10/23; we have been using Hydrofera Blue since 2 weeks ago. This is on the left medial ankle. Dimensions are better 11/6; using Hydrofera Blue. I think the wound is smaller but still not closed. Left medial ankle 11/13; we have been using Hydrofera Blue. Wound is certainly no smaller this week. Also the surface not as good. This is the remanent of a very large area on her left medial ankle. 11/20; using Sorbact since last week. Wound was about the same in terms of size although I was disappointed about the surface debris 12/11; 3-week  follow-up. Patient was on vacation. Wound is measuring slightly larger we have been using Sorbact. 12/18; wound is about the same size  however surface looks better last week after debridement. We have been using Sorbact under compression 1/15 wound is probably twice the size of last time increased in length nonviable surface. We have been using Sorbact. She was running a mild fever and missed her appointment last week 1/22; the wound is come down in size but under illumination still a very adherent debris we have been Hydrofera Blue that I changed her to last week 1/29; dimensions down slightly. We have been using Hydrofera Blue 2/19 dimensions are the same however there is rims of epithelialization under illumination. Therefore more the surface area may be epithelialized 2/26; the patient's wound actually measures smaller. The wound looks healthy. We have been using Hydrofera Blue. I had some thoughts about running Apligraf then I still may do that however this looks so much better this week we will delay that for now Electronic Signature(s) Signed: 07/29/2019 5:26:28 PM By: Linton Ham MD Entered By: Linton Ham on 07/29/2019 11:27:09 -------------------------------------------------------------------------------- Physical Exam Details Patient Name: Date of Service: [name] 07/29/2019 10:00 AM Medical Record [id number] Patient Account Number: [id number] Date of Birth/Sex: Treating RN: 1955/10/30 (64 y.o. [name] Primary Care Provider: [name] Other Clinician: Referring Provider: Treating Provider/Extender:Taelor Waymire, Tawny Asal in Treatment: 346 Constitutional Sitting or standing Blood Pressure is within target range for patient.. Pulse regular and within target range for patient.Marland Kitchen Respirations regular, non-labored and within target range.. Temperature is normal and within the target range for the patient.Marland Kitchen Appears in no  distress. Eyes Conjunctivae clear. No discharge.no icterus. Respiratory work of breathing is normal. Cardiovascular It will pulses are palpable. Musculoskeletal She inverts at the left ankle. Notes Wound exam; left medial malleolus under illumination looks a lot better. No debridement was required. There is no erythema around the wound. Edema in the area is quite good and her peripheral pulses are palpable Electronic Signature(s) Signed: 07/29/2019 5:26:28 PM By: Linton Ham MD Entered By: Linton Ham on 07/29/2019 11:28:05 -------------------------------------------------------------------------------- Physician Orders Details Patient Name: Date of Service: [name] 07/29/2019 10:00 AM Medical Record [id number] Patient Account Number: [id number] Date of Birth/Sex: Treating RN: 05-20-1956 (64 y.o. 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 - Friday Dressing Change Frequency Wound #3 Left,Medial Malleolus Do not change entire dressing for one week. - change dressing in one week Skin Barriers/Peri-Wound Care Moisturizing lotion TCA Cream or Ointment - mixed with lotion Wound Cleansing Clean wound with Wound Cleanser - clean with anasept with dressing changes May shower with protection. Primary Wound Dressing Wound #3 Left,Medial Malleolus Skin Substitute Application - Running IVR for Apligraft Hydrofera Blue - classic Secondary Dressing Wound #3 Left,Medial Malleolus Dry Gauze Edema Control Unna Boot to Left Lower Extremity - no  kerlix layer Avoid standing for long periods of time Elevate legs to the level of the heart or above for 30 minutes daily and/or when sitting, a frequency of: - throughout the day Support Garment 20-30 mm/Hg pressure to: - compression stocking right leg daily Electronic Signature(s) Signed: 07/29/2019 5:17:01 PM By: Kela Millin Signed: 07/29/2019 5:26:28 PM By: Linton Ham MD Entered By: Kela Millin on 07/29/2019 11:11:19 -------------------------------------------------------------------------------- Problem List Details Patient Name:  Date of Service: DEZARAE, ORDUNA 07/29/2019 10:00 AM Medical Record [id number] Patient Account Number: [id number] Date of Birth/Sex: Treating RN: August 29, 1955 (64 y.o. 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 08/03/2014 08/03/2014 and inflammation L03.116 Cellulitis of left lower limb 09/07/2014 09/07/2014 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 [name] Primary Care Provider: [name] Other Clinician: Referring Provider: Treating Provider/Extender:Coren Sagan, Tawny Asal in Treatment: 346 Subjective History of Present Illness (HPI) the remaining wound is over the left medial ankle. Similar wound over the right medial ankle healed largely with use of Apligraf. Most recently we have been using Hydrofera Blue over this wound with considerable improvement. The patient has been extensively worked up in the past for her venous insufficiency and she is not a candidate for antireflux surgery although I have none of the details available currently. 08/24/14; considerable improvement today. About 50% of this wound areas now epithelialized. The base of the wound appears to be healthier granulation.as opposed to last week when she had deteriorated a considerable improvement 08/17/14; unfortunately the wound has regressed somewhat. The areas of epithelialization from the superior aspect are not nearly as healthy as they were last week. The patient thinks her Hydrofera Blue slipped. 09/07/14; unfortunately the area has markedly regressed in the 2 weeks since I've seen this. There is an odor surrounding erythema. The healthy granulation tissue that we had at the base of the wound now is a dusky color. The nurse reports green drainage 09/14/14; the area looks somewhat better than last week. There is less erythema and less drainage. The culture I did did not show any growth. Nevertheless I think it is better to continue the Cipro and doxycycline for a further week. The remaining wound area was debridement. 09/21/14. Wound did not require debridement last week. Still less erythema and less drainage. She can complete her  antibiotics. The areas of epithelialization in the superior aspect of the wound do not look as healthy as they did some weeks ago 10/05/14 continued improvement in the condition of this wound. There is advancing epithelialization. Less aggressive debridement required 10/19/14 continued improvement in the condition and volume of this wound. Less aggressive debridement to the inferior part of this to remove surface slough and fibrinous eschar 11/02/14 no debridement is required. The surface granulation appears healthy although some of her islands of epithelialization seem to have regressed. No evidence of infection 11/16/14; lites surface debridement done of surface eschar. The wound does not look to be unhealthy. No evidence of infection. Unfortunately the patient has had podiatry issues in the right foot and for some  reason has redeveloped small surface ulcerations in the medial right ankle. Her original presentation involved wounds in this area 11/23/14 no debridement. The area on the right ankle has enlarged. The left ankle wound appears stable in terms of the surface although there is periwound inflammation. There has been regression in the amount of new skin 11/30/14 no debridement. Both wound areas appear healthy. There was no evidence of infection. The the new area on the right medial ankle has enlarged although that both the surfaces appear to be stable. 12/07/14; Debridement of the right medial ankle wound. No no debridement was done on the left. 12/14/14 no major change in and now bilateral medial ankle wounds. Both of these are very painful but the no overt evidence of infection. She has had previous venous ablation 12/21/14; patient states that her right medial ankle wound is considerably more painful last week than usual. Her left is also somewhat painful. She could not tolerate debridement. The right medial ankle wound has fibrinous surface eschar 12/28/14 this is a patient with severe  bilateral venous insufficiency ulcers. For a considerable period of time we actually had the one on the right medial ankle healed however this recently opened up again in June. The left medial ankle wound has been a refractory area with some absent flows. We had some success with Hydrofera Blue on this area and it literally closed by 50% however it is recently opened up Foley. Both of these were debridement today of surface eschar. She tolerates this poorly 01/25/15: No change in the status of this. Thick adherent escar. Very poor tolerance of any attempt at debridement. I had healed the right medial malleolus wound for a considerable amount of time and had the left one down to about 50% of the volume although this is totally regressed over the last 48 weeks. Further the right leg has reopened. she is trying to make a appointment with pain and vascular, previous ablations with Dr. Aleda Grana. I do not believe there is an arterial insufficiency issue here 02/01/15 the status of the adherent eschar bilaterally is actually improved. No debridement was done. She did not manage to get vascular studies done 02/08/15 continued debridement of the area was done today. The slough is less adherent and comes off with less pressure. There is no surrounding infection peripheral pulses are intact 02/15/15 selective debridement with a disposable curette. Again the slough is less adherent and comes off with less difficulty. No surrounding infection peripheral pulses are intact. 02/22/15 selective debridement of the right medial ankle wound. Slough comes off with less difficulty. No obvious surrounding infection peripheral pulses are intact I did not debridement the one on the left. Both of these are stable to improved 03/01/15 selective debridement of both wound areas using a curette to. Adherent slough cup soft with less difficulty. No obvious surrounding infection. The patient tells me that 2 days ago she noted a  rash above the right leg wrap. She did not have this on her lower legs when she change this over she arrives with widespread left greater than right almost folliculitis-looking rash which is extremely pruritic. I don't see anything to culture here. There is no rash on the rest of her body. She feels well systemically. 03/08/15; selective debridement of both wounds using a curette. Base of this does not look unhealthy. She had limegreen drainage coming out of the left leg wound and describes a lot of drainage. The rash on her left leg looks improved to. No cultures  were done. 03/22/15; patient was not here last week. Basal wounds does not look healthy and there is no surrounding erythema. No drainage. There is still a rash on the left leg that almost looks vasculitic however it is clearly limited to the top of where the wrap would be. 04/05/15; on the right required a surgical debridement of surface eschar and necrotic subcutaneous tissue. I did not debridement the area on the left. These continue to be large open wounds that are not changing that much. We were successful at one point in healing the area on the right, and at the same time the area on the left was roughly half the size of current measurements. I think a lot of the deterioration has to do with the prolonged time the patient is on her feet at work 04/19/15 I attempted-like surface debridement bilaterally she does not tolerate this. She tells me that she was in allergic care yesterday with extreme pain over her left lateral malleolus/ankle and was told that she has an "sprain" 05/03/15; large bilateral venous insufficiency wounds over the medial malleolus/medial aspect of her ankles. She complains of copious amounts of drainage and his usual large amounts of pain. There is some increasing erythema around the wound on the right extending into the medial aspect of her foot to. historically she came in with these wounds the right one healed  and the left one came down to roughly half its current size however the right one is reopened and the left is expanded. 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 05/10/15 large bilateral venous insufficiency wounds. There is less adherence surface left however the surface culture that I did last week grew pseudomonas therefore bilateral selective debridement score necessary. There is surrounding erythema. The patient describes severe bilateral drainage and a lot of pain in the left ankle. Apparently her podiatrist was were ready to do a cortisone shot 05/17/15; the patient complains of pain and again copious amounts of drainage. 05/24/15; we used Iodo flex last week. Patient notes considerable improvement in wound drainage. Only needed to change this once. 05/31/15; we continued Iodoflex; the base of these large wounds bilaterally is not too bad but there is probably likely a significant bioburden here. I would like to debridement just doesn't tolerate it. 06/06/14 I would like to continue the Iodoflex although she still hasn't managed to obtain supplies. She has bilateral medial malleoli or large wounds which are mostly superficial. Both of them are covered circumferentially with some nonviable fibrinous slough although she tolerates debridement very poorly. She apparently has an appointment for an ablation on the right leg by interventional radiology. 06/14/15; the patient arrives with the wounds and static condition. We attempted a debridement although she does not do well with this secondary to pain. I 07/05/15; wounds are not much smaller however there appears to be a cleaner granulating base. The left has tight fibrinous slough greater than the right. Debridement is tolerated poorly due to pain. Iodoflex is done more for these wounds in any of the multitude of different dressings I have tried on the left 1 and then subsequently the  right. 07/12/15; no change in the condition of this wound. I am able to do an aggressive debridement on the right but not the left. She simply cannot tolerate it. We have been using Iodoflex which helps somewhat. It is worthwhile remembering that at one point we healed the right  medial ankle wound and the left was about 25% of the current circumference. We have suggested returning to vascular surgery for review of possible further ablations for one reason or another she has not been able to do this. 07/26/15 no major change in the condition of either wound on her medial ankle. I did not attempt to debridement of these. She has been aggressively scrubbing these while she is in the shower at home. She has her supply of Iodoflex which seems to have done more for these wounds then anything I have put on recently. 08/09/15 wound area appears larger although not verified by measurements. Using Iodoflex 09/05/2015 -- she was here for avisit today but had significant problems with the wound and I was asked to see her for a physician opinion. I have summarize that this lady has had surgery on her left lower extremity about 10 years ago where the possible veins stripping was done. She has had an opinion from interventional radiology around November 2016 where no further sclerotherapy was ordered. The patient works 12 hours a day and stands on a concrete floor with work boots and is unable to get the proper compression she requires and cannot elevate her limbs appropriately at any given time. She has recently grown Pseudomonas from her wound culture but has not started her ciprofloxacin which was called in for her. 09/13/15 this continues to be a difficult situation for this patient. At one point I had this wound down to a 1.5 x 1.5" wound on her left leg. This is deteriorated and the right leg has reopened. She now has substantial wounds on her medial calcaneus, malleoli and into her lower leg. One on the left  has surface eschar but these are far too painful for me to debridement here. She has a vascular surgery appointment next week to see if anything can be done to help here. I think she has had previous ablations several years ago at [facility]. She has no major edema. She tells me that she did not get product last time [facility]] and went for several days without it. She continues to work in work boots 12 hours a day. She cannot get compression/4-layer under her work boots. 09/20/15 no major change. Periwound edema control was not very good. Her point with pain and vascular is next Wednesday the 25th 09/28/15; the patient is seen vascular surgery and is apparently scheduled for repeat duplex ultrasounds of her bilateral lower legs next week. 10/05/15; the patient was seen by Dr. Doren Custard of vascular surgery. He feels that she should have arterial insufficiency excluded as cause/contributed to her nonhealing stage she is therefore booked for an arteriogram. She has apparently monophasic signals in the dorsalis pedis pulses. She also of course has known severe chronic venous insufficiency with previous procedures as noted previously. I had another long discussion with the patient today about her continuing to work 12 hour shifts. I've written her out for 2 months area had concerns about this as her work location is currently undergoing significant turmoil and this may lead to her termination. She is aware of this however I agree with her that she simply cannot continue to stand for 12 hours multiple days a week with the substantial wound areas she has. 10/19/15; the Dr. Doren Custard appointment was largely for an arteriogram which was normal. She does not have an arterial issue. He didn't make a comment about her chronic venous insufficiency for which she has had previous ablations. Presumably it was  not felt that anything additional could be done. The patient is now out of work as I prescribed 2 weeks ago.  Her wounds look somewhat less aggravated presumably because of this. I felt I would give debridement another try today 10/25/15; no major change in this patient's wounds. We are struggling to get her product that she can afford into her own home through her insurance. 11/01/15; no major change in the patient's wounds. I have been using silver alginate as the most affordable product. I spoke to Dr. Marla Roe last week with her requested take her to the OR for surgical debridement and placement of ACEL. Dr. Marla Roe told me that she would be willing to do this however [facility] will not cover this, fortunately the patient has [place] healthcare of some variant 11/08/15; no major change in the patient's wounds. She has been completely nonviable surface that this but is in too much pain with any attempted debridement are clinic. I have arranged for her to see Dr. Marla Roe ham of plastic surgery and this appointment is on Monday. I am hopeful that they will take her to the OR for debridement, possible ACEL ultimately possible skin graft 11/22/15 no major change in the patient's wounds over her bilateral medial calcaneus medial malleolus into the lower legs. Surface on these does not look too bad however on the left there is surrounding erythema and tenderness. This may be cellulitis or could him sleepy tinea. 11/29/15; no major changes in the patient's wounds over her bilateral medial malleolus. There is no infection here and I don't think any additional antibiotics are necessary. There is now plan to move forward. She sees Dr. Marla Roe in a week's time for preparation for operative debridement and ACEL placement I believe on 7/12. She then has a follow-up appointment with Dr. Marla Roe on 7/21 12/28/15; the patient returns today having been taken to the [facility] by Dr. Marla Roe 12/12/15 she underwent debridement, intraoperative cultures [which were negative]. She had placement of a wound VAC.  Parent really ACEL was not available to be placed. The wound VAC foam apparently adhered to the wound since then she's been using silver alginate, Xeroform under Ace wraps. She still says there is a lot of drainage and a lot of pain 01/31/16; this is a patient I see monthly. I had referred her to Dr. Marla Roe him of plastic surgery for large wounds on her bilateral medial ankles. She has been to the OR twice once in early July and once in early August. She tells me over the last 3 weeks she has been using the wound VAC with ACEL underneath it. On the right we've simply been using silver alginate. Under Kerlix Coban wraps. 02/28/16; this is a patient I'm currently seeing monthly. She is gone on to have a skin graft over her large venous insufficiency ulcer on the left medial ankle. This was done by Dr. Marla Roe him. The patient is a bit perturbed about why she didn't have one on her right medial ankle wound. She has been using silver alginate to this. 03/06/16; I received a phone call from her plastic surgery Dr. Marla Roe. She expressed some concern about the viability of the skin graft she did on the left medial ankle wound. Asked me to place Endoform on this. She told me she is not planning to do a subsequent skin graft on the right as the left one did not take very well. I had placed Hydrofera Blue on the right 03/13/16; continue to  have a reasonably healthy wound on the right medial ankle. Down to 3 mm in terms of size. There is epithelialization here. The area on the left medial ankle is her skin graft site. I suppose the last week this looks somewhat better. She has an open area inferiorly however in the center there appears to be some viable tissue. There is a lot of surface callus and eschar that will eventually need to come off however none of this looked to be infected. Patient states that the is able to keep the dressing on for several days which is an improvement. 03/20/16 no major  change in the circumference of either wound however on the left side the patient was at Dr. Eusebio Friendly office and they did a debridement of left wound. 50% of the wound seems to be epithelialized. I been using Endoform on the left Hydrofera Blue in the right 03/27/16; she arrives today with her wound is not looking as healthy as they did last week. The area on the right clearly has an adherent surface to this a very similar surface on the left. Unfortunately for this patient this is all too familiar problem. Clearly the Endoform is not working and will need to change that today that has some potential to help this surface. She does not tolerate debridement in this clinic very well. She is changing the dressing wants 04/03/16; patient arrives with the wounds looking somewhat better especially on the right. Dr. Migdalia Dk change the dressing to silver alginate when she saw her on Monday and also sold her some compression socks. The usefulness of the latter is really not clear and woman with severely draining wounds. 04/10/16; the patient is doing a bit of an experiment wearing the compression stockings that Dr. Migdalia Dk provided her to her left leg and the out of legs based dressings that we provided to the right. 05/01/16; the patient is continuing to wear compression stockings Dr. Migdalia Dk provided her on the left that are apparently silver impregnated. She has been using Iodoflex to the right leg wound. Still a moderate amount of drainage, when she leaves here the wraps only last for 4 days. She has to change the stocking on the left leg every night 05/15/16; she is now using compression stockings bilaterally provided by Dr. Marla Roe. She is wearing a nonadherent layer over the wounds so really I don't think there is anything specific being done to this now. She has some reduction on the left wound. The right is stable. I think all healing here is being done without a specific dressing 06/09/16; patient  arrives here today with not much change in the wound certainly in diameter to large circular wounds over the medial aspect of her ankle bilaterally. Under the light of these services are certainly not viable for healing. There is no evidence of surrounding infection. She is wearing compression stockings with some sort of silver impregnation as prescribed by Dr. Marla Roe. She has a follow-up with her tomorrow. 06/30/16; no major change in the size or condition of her wounds. These are still probably covered with a nonviable surface. She is using only her purchase stockings. She did see Dr. Marla Roe who seemed to want to apply Dakin's solution to this I'm not extreme short what value this would be. I would suggest Iodoflex which she still has at home. 07/28/16; I follow Mrs. Beidleman episodically along with Dr. Marla Roe. 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 She has  been applying a topical collagen ointment to both wounds with Adaptic. I don't think Dr. Marla Roe is planning to take her back to the OR. 08/19/16; I follow Mrs. Jeneen Rinks on a monthly basis along with Dr. Marla Roe of plastic surgery. 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 I been following her for a number of years. At one point I was able to get the right medial malleolus wound to heal and had the left medial malleolus down to about half its current size however and I had to send her to plastic surgery for an operative debridement. Since then things have been stable to slightly improve the area on the right is slightly better one in the left about the same although there is much less adherent surface than I'm used to with this patient. She is using some form of liquid collagen gel that Dr. Marla Roe provided a Kerlix cover with the patient's own pressure stockings. She tells me that she has extreme  pain in both ankles and along the lateral aspect of both feet. She has been unable to work for some period of time. She is telling me she is retiring at the beginning of April. She sees Dr. Doran Durand of orthopedics next week 09/22/16; patient has not seen Dr. Marla Roe since the last time she is here. I'm not really sure what she is using to the wounds other than bits and pieces of think she had left over including most recently Hydrofera Blue. She is using juxtalite stockings. She is having difficulty with her husband's recent illness "stroke". She is having to transport him to various doctors appointments. Dr. Marla Roe left her the option of a repeat debridement with ACEL however she has not been able to get the time to follow-up on this. She continues to have a fair amount of drainage out of these wounds with certainly precludes leaving dressings on all week 10/13/16; patient has not seen Dr. Marla Roe since she was last in our clinic. I'm not really sure what she is doing with the wounds, we did try to get her [facility] and I think she is actually using this most of the time. Because of drainage she states she has to change this every second day although this is an improvement from what she used to do. She went to see Dr. Doran Durand who did not think she had a muscular issue with regards to her feet, he referred her to a neurologist and I think the appointment is sometime in June. I changed her back to Iodoflex which she has used in the past but not recently. 11/03/16; the patient has been using Iodoflex although she ran out of this. Still claims that there is a lot of drainage although the wound does not look like this. No surrounding erythema. She has not been back to see Dr. Marla Roe 11/24/16; the patient has been using Iodoflex again but she ran out of it 2 or 3 days ago. There is no major change in the condition of either one of these wounds in fact they are larger and covered in a thick  adherent surface slough/nonviable tissue especially on the left. She does not tolerate mechanical debridement in our clinic. Going back to see Dr. Marla Roe of plastic surgery for an operative debridement would seem reasonable. 12/15/16; the patient has not been back to see Dr. Marla Roe. She is been dealing with a series of illnesses and her husband which of monopolized her time.  She is been using Sorbact which we largely supplied. She states the drainage is bad enough that it maximum she can go 2-3 days without changing the dressing 01/12/2017 -- the patient has not been back for about 4 weeks and has not seen Dr. Marla Roe not does she have any appointment pending. 01/23/17; patient has not seen Dr. Marla Roe even though I suggested this previously. She is using Santyl that was suggested last week by Dr. Con Memos this Cost her $16 through her insurance which is indeed surprising 02/12/17; continuing Santyl and the patient is changing this daily. A lot of drainage. She has not been back to see plastic surgery she is using an Ace wrap. Our intake nurse suggested wrap around stockings which would make a good reasonable alternative 02/26/17; patient is been using Santyl and changing this daily due to drainage. She has not been to see plastic surgery she uses in April Ace wrap to control the edema. She did obtain extremitease stockings but stated that the edema in her leg was to big for these 03/20/17; patient is using Santyl and Anasept. Surfaces looked better today the area on the right is actually measuring a little smaller. She has states she has a lot of pain in her feet and ankles and is asking for a consult to pain control which I'll try to help her with through our case manager. 04/10/17; the patient arrives with better-looking wound surfaces and is slightly smaller wound on the left she is using a combination of Santyl and Anasept. She has an appointment or at least as started in the pain  control center associated with [facility] 05/14/17; this is a patient who I followed for a prolonged period of time. 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 At one point I had this down to a much smaller wound on the left however these reopened and we've never been able to get these to heal. She has been using Santyl and Anasept gel although 2 weeks ago she ran out of the Anasept gel. She has a stable appearance of the wound. She is going to the wound care clinic at [facility]. They wanted do a nerve block/spinal block although she tells me she is reluctant to go forward with that. 05/21/17; this is a patient I have followed for many years. 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 Chronic pain and deformity in her ankles as well. She is been to see plastic surgery as well as orthopedics. Using PolyMem AG most recently/Kerramax/ABDs and 2 layer compression. She has managed to keep this on and she is coming in for a nurse check to change the dressing on Tuesdays, we see her on Fridays 06/05/17; really quite a good looking surface and the area especially on the right medial has contracted in terms of dimensions. Well granulated healthy-looking tissue on both sides. Even with an open curet there is nothing that even feels abnormal here. This is as good as I've seen this in quite some time. We have been using PolyMem AG and bringing her in for a nurse check 06/12/17; really quite good surface on both of these wounds. The right medial has contracted a bit left is not. We've been using PolyMem and AG and she is coming in for a nurse visit 06/19/17; we have been using PolyMem AG and bringing her in for a nurse check. Dimensions of her wounds are not better but the surfaces looked better bilaterally. She complained of  bleeding last night and the left wound and increasing pain bilaterally. She states her wound pain is more neuropathic than  just the wounds. There was some suggestion that this was radicular from her pain management doctor in talking to her it is really difficult to sort this out. 06/26/17; using PolyMem and AG and bringing her in for a nurse check as All of this and reasonably stable condition. Certainly not improved. The dimensions on the lateral part of the right leg look better but not really measuring better. The medial aspect on the left is about the same. 07/03/16; we have been using PolyMen AG and bringing her in for a nurse check to change the dressings as the wounds have drainage which precludes once weekly changing. We are using all secondary absorptive dressings.our intake nurse is brought up the idea of using a wound VAC/snap VAC on the wound to help with the drainage to see if this would result in some contraction. This is not a bad idea. The area on the right medial is actually looking smaller. Both wounds have a reasonable-looking surface. There is no evidence of cellulitis. The edema is well controlled 07/10/17; the patient was denied for a snap VAC by her insurance. The major issue with these wounds continues to be drainage. We are using wicked PolyMem AG and she is coming in for a nurse visit to change this. The wounds are stable to slightly improved. The surface looks vibrant and the area on the right certainly has shrunk in size but very slowly 07/17/17; the patient still has large wounds on her bilateral medial malleoli. Surface of both of these wounds looks better. The dimensions seem to come and go but no consistent improvement. There is no epithelialization. We do not have options for advanced treatment products due to insurance issues. They did not approve of the wound VAC to help control the drainage. More recently we've been using PolyMem and AG wicked to allow drainage through. We have been bringing her in for a nurse visit to change this. We do not have a lot of options for wound care products  and the home again due to insurance issues 07/24/17; the patient's wound actually looks somewhat better today. No drainage measurements are smaller still healthy-looking surface. We used silver collagen under PolyMen started last week. We have been bringing her in for a dressing change 07/31/17; patient's wound surface continued to look better and I think there is visible change in the dimensions of the wound on the right. Rims of epithelialization. We have been using silver collagen under PolyMen and bringing her in for a dressing change. There appears to be less drainage although she is still in need of the dressing change 08/07/17. Patient's wound surface continues to look better on both sides and the area on the right is definitely smaller. We have been using silver collagen and PolyMen. She feels that the drainage has been it has been better. I asked her about her vascular status. She went to see Dr. Aleda Grana at [facility] and had some form of ablation. I don't have much detail on this. I haven't my notes from 2016 that she was not a candidate for any further ablation but I don't have any more information on this. We had referred her to vein and vascular I don't think she ever went. He does not have a history of PAD although I don't have any information on this either. We don't even have ABIs in our record 08/14/17;  we've been using silver collagen and PolyMen cover. And putting the patient and compression. She we are bringing her in as a nurse visit to change this because ofarge amount of drainage. We didn't the ABIs in clinic today since they had been done in many moons 1.2 bilaterally. She has been to see vein and vascular however this was at [facility] and she had ablation although I really don't have any information on this all seemed biking get a report. She is also been operatively debrided by plastic surgery and had a cell placed probably 8-12 months ago. This didn't have a major  effect. We've been making some gains with current dressings 08/19/17-She is here in follow-up evaluation for bilateral medial malleoli ulcers. She continues to tolerate debridement very poorly. We will continue with recently changed topical treatment; if no significant improvement may consider switching to Iodosorb/Iodoflex. 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 She has been using silver collagen and PolyMem. I believe she has been used and tried on Iodoflex before. 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 These have been very refractory over time. We have been using silver collagen and PolyMen. She is coming in once a week for a doctor's and once a week for nurse visits. We are actually making some progress 09/18/17; the patient's wounds are smaller especially on the right medial. She arrives today to upset to consider even washing these off with Anasept which I think is been part of the reason this is been closing. We've been using collagen covered in PolyMen otherwise. It is noted that she has a small area of folliculitis on the right medial calf that. As we are wrapping her legs I'll give her a short course of doxycycline to make sure this doesn't amount to anything. She is a long list of complaints today including imbalance, shortness of breath on exertion, inversion of her left ankle. With regards to the latter complaints she is been to see orthopedics and they offered her a tendon release surgery I believe but wanted her wounds to be closed first. I have recommended she go see her primary physician with regards to everything else. 09/25/17; patient's wounds are about the same size. We have made some progress bilaterally although not in  recent weeks. She will not allow me T wash these wounds with Anasept even if she is doing her cell. Wheeze we've been using collagen covered in PolyMen. Last week she had a small area of folliculitis this is now opened into a small wound. She completed 5 days of trimethoprim sulfamethoxazole 10/02/17; unfortunately the area on her left medial ankle is worse with a larger wound area towards the Achilles. The patient complains of a lot of pain. She will not allow debridement although visually I don't think there is anything to debridement in any case. We have been using silver collagen and PolyMen for several months now. Initially we are making some progress although I'm not really seeing that today. We will move back to [facility]. His admittedly this is a bit of a repeat however I'm hoping that his situation is different now. The patient tells me she had her leg on the left give out on her yesterday this is process some pain. 10/09/17; the patient is seen twice a  week largely because of drainage issues coming out of the chronic medial bimalleolar wounds that are chronic. Last week the dimensions of the one on the left looks a little larger I changed her to [facility]. She comes in today with a history of terrible pain in the bilateral wound areas. She will not allow debridement. She will not even allow a tissue culture. There is no surrounding erythema no no evidence of cellulitis. We have been putting her Kerlix Coban man. She will not allow more aggressive compression as there was a suggestion to put her in 3 layer wraps. 10/16/17; large wounds on her bilateral medial malleoli. These are chronic. Not much change from last week. The surface looks have healthy but absolutely no epithelialization. A lot of pain little less so of drainage. She will not allow debridement or even washing these off in the vigorous fashion with Anasept. 10/23/17; large wounds on her bilateral malleoli which are  chronic. Some improvement in terms of size perhaps on the right since last time I saw these. She states that after we increased the 3 layer compression there was some bleeding, when she came in for a nurse visit she did not want 3 layer compression put back on about our nurse managed to convince her. She has known chronic venous visit issues and I'm hoping to get her to tolerate the 3 layer compression. using Hydrofera Blue 10/30/17; absolutely no change in the condition of either wound although we've had some improvement in dimensions on the right.. Attempted to put her in 3 layer compression she didn't tolerated she is back in 2 layer compression. We've been using Hydrofera Blue We looked over her past records. She had venous reflux studies in November 2016. There was no evidence of deep venous reflux on the right. Superficial vein did not show the greater saphenous vein at think this is been previously ablated the small saphenous vein was within normal limits. The left deep venous system showed no DVT the vessels were positive for deep venous reflux in the posterior tibial veins at the ankle. The greater saphenous vein was surgically absent small saphenous vein was within normal limits. She went to vein and vascular at [facility]. I believe she had an ablation on the left greater saphenous vein. I'll update her reflux studies perhaps ever reviewed by vein and vascular. We've made absolutely no progress in these wounds. Will also try to read and TheraSkins through her insurance 11/06/17; W the patient apparently has a 2 week follow-up with vein and vascular I like him to review the whole issue with regards to her previous vascular workup by Dr. Aleda Grana. We've really made no progress on these wounds in many months. She arrives today with less viable looking surface on the left medial ankle wound. This was apparently looking about the same on Tuesday when she was here for nurse  visit. 11/13/17; deep tissue culture I did last time of the left lower leg showed multiple organisms without any predominating. In particular no Staphylococcus or group A strep were isolated. We sent her for venous reflux studies. She's had a previous left greater saphenous vein stripping and I think sclerotherapy of the right greater saphenous vein. She didn't really look at the lesser saphenous vein this both wounds are on the medial aspect. She has reflux in the common femoral vein and popliteal vein and an accessory vein on the right and the common femoral vein and popliteal vein on the left. I'm going to  have her go to see vein and vascular just the look over things and see if anything else beside aggressive compression is indicated here. We have not been able to make any progress on these wounds in spite of the fact that the surface of the wounds is never look too bad. 11/20/17; no major change in the condition of the wounds. Patient reports a large amount of drainage. She has a lot of complaints of pain although enlisting her today I wonder if some of this at least his neuropathic rather than secondary to her wounds. She has an appointment with vein and vascular on 12/30/17. The refractory nature of these wounds in my mind at least need vein and vascular to look over the wounds the recent reflux studies we did and her history to see if anything further can be done here. I also note her gait is deteriorated quite a bit. 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 She has a bilateral Trendelenburg gait. I wonder if this is neuropathic or perhaps multilevel radicular. 11/27/17; her wounds actually looks slightly better. Healthy-looking granulation tissue a scant amount of epithelialization. [place] [facility] will not pay for [employer]. They will play for tri layer Oasis and Dermagraft. This is not a diabetic ulcer. We'll try for the tri layer Oasis. She still complains of some drainage.  She has a vein and vascular appointment on 12/30/17 12/04/17; the wounds visually look quite good. Healthy-looking granulation with some degree of epithelialization. We are still waiting for response to our request for trial to try layer Oasis. Her appointment with vascular to review venous and arterial issues isn't sold the end of July 7/31. Not allow debridement or even vigorous cleansing of the wound surface. 12/18/17; slightly smaller especially on the right. 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 We've been using Hydrofera Blue. We still are looking into triple layer Oasis through her insurance 01/08/18 on evaluation today patient's wound actually appears to be showing signs of good improvement at this point in time. She has been tolerating the dressing changes without complication. Fortunately there does not appear to be any evidence of infection at this point in time. We have been utilizing silver nitrate which does seem to be of benefit for her which is also good news. Overall I'm very happy with how things seem to be both regards appearance as well as measurement. Patient did see Dr. Bridgett Larsson for evaluation on 12/30/17. In his assessment he felt that stripping would not likely add much more than chronic compression to the patient's healing process. His recommendation was to follow-up in three months with Dr. Doren Custard if she hasn't healed in order to consider referral back to you and see vascular where she previously was in a trial and was able to get her wound to heal. I'll be see what she feels she when you staying compression and he reiterated this as well. 01/13/18 on evaluation today patient appears to actually be doing very well in regard to her bilateral medial malleolus ulcers. She seems to have tolerated the chemical cauterization with silver nitrate last week she did have some pain through that evening but fortunately states that I'll be see since it seems to be  doing better she is overall pleased with the progress. 01/21/18; really quite a remarkable improvement since I've last seen these wounds. We started using silver nitrate specially on the islands of hyper granulation which for some reason her around the wound circumference. This is really  done quite nicely. Primary dressing Hydrofera Blue under 4 layer compression. She seems to be able to hold out without a nurse rewrap. Follow-up in 1 week 01/28/18; we've continued the hydrofera blue but continued with chemical cauterization to the wound area that we started about a month ago for irregular hyper granulation. She is made almost stunning improvement in the overall wound dimensions. I was not really expecting this degree of improvement in these chronic wounds 02/05/18; we continue with Hydrofera Bluebut of also continued the aggressive chemical cauterization with silver nitrate. We made nice progress with the right greater than left wound. 02/12/18. We continued with Hydrofera Blue after aggressive chemical cauterization with silver nitrate. We appear to be making nice progress with both wound areas 02/19/2018; we continue with [facility] after washing the wounds vigorously with Anasept spray and chemical cauterization with silver nitrate. We are making excellent progress. The area on the right's just about closed 02/26/2018. The area on the left medial ankle had too much necrotic debris today. I used a #5 curette we are able to get most of the soft. I continued with the silver nitrate to the much smaller wound on the right medial ankle she had a new area on her right lower pretibial area which she says was due to a role in her compression 03/05/2018; both wound areas look healthy. Not much change in dimensions from last week. I continue to use silver nitrate and Hydrofera Blue. The patient saw Dr. Doren Custard of vein and vascular. He felt she had venous stasis ulcers. He felt based on her  previous arteriogram she should have adequate circulation for healing. Also she has deep venous reflux but really no significant correctable superficial venous reflux at this time. He felt we should continue with conservative management including leg elevation and compression 04/02/2018; since we last saw this woman about a month ago she had a fall apparently suffered a pelvic fracture. I did not look up the x-ray. Nevertheless because of pain she literally was bedbound for 2 weeks and had home health coming out to change the dressing. Somewhat predictably this is resulted in considerable improvement in both wound areas. The right is just about closed on the medial malleolus and the left is about half the size. 04/16/2018; both her wounds continue to go down in size. Using Hydrofera Blue. 05/07/18; both her wounds appeared to be improving especially on the right where it is almost closed. We are using Hydrofera Blue 05/14/2018; slightly worse this week with larger wounds. Surface on the left medial not quite as good. We have been using Hydrofera Blue 05/21/18; again the wounds are slightly larger. Left medial malleolus slightly larger with eschar around the circumference. We have been using Hydrofera Blue undergoing a wraps for a prolonged period of time. This got a lot better when she was more recumbent due to a fall and a back injury. I change the primary dressing the silver alginate today. She did not tolerate a 4 layer compression previously although I may need to bring this up with her next time 05/28/2018; area on the left medial malleolus again is slightly larger with more drainage. Area on the right is roughly unchanged. She has a small area of folliculitis on the right medial just on the lower calf. This does not look ominous. 06/03/2018 left medial malleolus slightly smaller in a better looking surface. We used silver nitrate on this last time with silver alginate. The area on the right  appears slightly smaller  1/10; left medial malleolus slightly smaller. Small open area on the right. We used silver nitrate and silver alginate as of 2 weeks ago. We continue with the wound and compression. These got a lot better when she was off her feet 1/17; right medial malleolus wound is smaller. The left may be slightly smaller. Both surfaces look somewhat better. 1/24; both wounds are slightly smaller. Using silver alginate under Unna boots 1/31; both wounds appear smaller in fact the area on the right medial is just about closed. Surface eschar. We have been using silver alginate under Unna boots. The patient is less active now spends let much less time on her feet and I think this is contributed to the general improvement in the wound condition 2/7; both wounds appear smaller. I was hopeful the right medial would be closed however there there is still the same small open area. Slight amount of surface eschar on the left the dimensions are smaller there is eschar but the wound edges appear to be free. We have been using silver alginate under Unna boot's 2/14; both wounds once again measure smaller. Circumferential eschar on the left medial. We have been using silver alginate under Unna boots with gradual improvement 2/21; the area on the right medial malleolus has healed. The area on the left is smaller. We have been using silver alginate and Unna boots. We can discharge wrapping the right leg she has 20/30 stockings at home she will need to protect the scar tissue in this area 2/28; the area on the right medial malleolus remains closed the patient has a compression stocking. The area on the left is smaller. We have been using silver alginate and Unna boots. 3/6 the area on the right medial ankle remains closed. Good edema control noted she is using her own compression stocking. The area on the left medial ankle is smaller. We have been managing this with silver alginate and Unna boots  which we will continue today. 3/13; the area on the right medial ankle remains closed and I'm declaring it healed today. When necessary the left is about the same still a healthy-looking surface but no major change and wound area. No evidence of infection and using silver alginate under unna and generally making considerable improvement 3/27 the area on the right medial ankle remains closed the area on the left is about the same as last week. Certainly not any worse we have been using silver alginate under an Unna boot 4/3; the area on the right medial ankle remains closed per the patient. We did not look at this wound. The wound on the left medial ankle is about the same surface looks healthy we have been using silver alginate under an Unna boot 4/10; area on the right medial ankle remains closed per the patient. We did not look at this wound. The wound on the left medial ankle is slightly larger. The patient complains that the [facility] caused burning pain all week. She also told us that she was a lot more active this week. Changed her back to silver alginate 4/17; right medial ankle still closed per the patient. Left medial ankle is slightly larger. Using silver alginate. She did not tolerate Hydrofera Blue on this area 4/24; right medial ankle remains closed we have not look at this. The left medial ankle continues to get larger today by about a centimeter. We have been using silver alginate under Unna boots. She complains about 4 layer compression as an alternative. She has  been up on her feet working on her garden 5/8; right medial ankle remains closed we did not look at this. The left medial ankle has increased in size about 100%. We have been using silver alginate under Unna boots. She noted increased pain this week and was not surprised that the wound is deteriorated 5/15; no major change in SA however much less erythema ( one week of doxy ocellulitis). 5/22-64 year old female  returns at 1 week to the clinic for left medial ankle wound for which we have been using silver alginate under 3 layer compression She was placed on DOXY at last visit - the wound is wider at this visit. She is in 3 layer compression 5/29; change to [facility] last week. I had given her empiric doxycycline 2 weeks ago for a week. She is in 3 layer compression. She complains of a lot of pain and drainage on presentation today. 6/5; using Hydrofera Blue. I gave her doxycycline recently empirically for erythema and pain around the wound. Believe her cultures showed enterococcus which not would not have been well covered by doxycycline nevertheless the wound looks better and I don't feel specifically that the enterococcus needs to be covered. She has a new what looks like a wrap injury on her lateral left ankle. 6/12; she is using Hydrofera Blue. She has a new area on the left anterior lower tibial area. This was a wrap injury last week. 6/19; the patient is using Hydrofera Blue. She arrived with marked inflammation and erythema around the wound and tenderness. 12/01/18 on evaluation today patient appears to be doing a little bit better based on what I'm hearing from the standpoint of lassos evaluation to this as far as the overall appearance of the wound is concerned. Then sometime substandard she typically sees Dr. Dellia Nims. Nonetheless overall very pleased with the progress that she's made up to this point. No fevers, chills, nausea, or vomiting noted at this time. 7/10; some improvement in the surface area. Aggressively debrided last week apparently. I went ahead with the debridement today although the patient does not tolerate this very well. We have been using Iodoflex. Still a fair amount of drainage 7/17; slightly smaller. Using Iodoflex. 7/24; no change from last week in terms of surface area. We have been using Iodoflex. Surface looks and continues to look somewhat better 7/31; surface  area slightly smaller better looking surface. We have been using Iodoflex. This is under Unna boot compression 8/7-Patient presents at 1 week with Unna boot and Iodoflex, wound appears better 8/14-Patient presents at 1 week with Iodoflex, we use the Unna boot, wound appears to be stable better.Patient is getting Botox treatment for the inversion of the foot for tendon release, Next week 8/21; we are using Iodoflex. Unna boot. The wound is stable in terms of surface area. Under illumination there is some areas of the wound that appear to be either epithelialized or perhaps this is adherent slough at this point I was not really clear. It did not wipe off and I was reluctant to debride this today. 8/28; we are using Iodoflex in an Unna boot. Seems to be making good improvement. 9/4; using Iodoflex and wound is slightly smaller. 9/18; we are using Iodoflex with topical silver nitrate when she is here. The wound continues to be smaller 10/2; patient missed her appointment last week due to GI issues. She left and Iodoflex based dressing on for 2 weeks. Wound is about the same size about the size of a  dime on the left medial lower 10/9 we have been using Iodoflex on the medial left ankle wound. She has a new superficial probable wrap injury on the dorsal left ankle 10/16; we have been using Hydrofera Blue since last week. This is on the left medial ankle 10/23; we have been using Hydrofera Blue since 2 weeks ago. This is on the left medial ankle. Dimensions are better 11/6; using Hydrofera Blue. I think the wound is smaller but still not closed. Left medial ankle 11/13; we have been using Hydrofera Blue. Wound is certainly no smaller this week. Also the surface not as good. This is the remanent of a very large area on her left medial ankle. 11/20; using Sorbact since last week. Wound was about the same in terms of size although I was disappointed about the surface debris 12/11; 3-week follow-up.  Patient was on vacation. Wound is measuring slightly larger we have been using Sorbact. 12/18; wound is about the same size however surface looks better last week after debridement. We have been using Sorbact under compression 1/15 wound is probably twice the size of last time increased in length nonviable surface. We have been using Sorbact. She was running a mild fever and missed her appointment last week 1/22; the wound is come down in size but under illumination still a very adherent debris we have been Hydrofera Blue that I changed her to last week 1/29; dimensions down slightly. We have been using Hydrofera Blue 2/19 dimensions are the same however there is rims of epithelialization under illumination. Therefore more the surface area may be epithelialized 2/26; the patient's wound actually measures smaller. The wound looks healthy. We have been using Hydrofera Blue. I had some thoughts about running Apligraf then I still may do that however this looks so much better this week we will delay that for now Objective Constitutional Sitting or standing Blood Pressure is within target range for patient.. Pulse regular and within target range for patient.Marland Kitchen Respirations regular, non-labored and within target range.. Temperature is normal and within the target range for the patient.Marland Kitchen Appears in no distress. Vitals Time Taken: 10:34 AM, Height: 68 in, Weight: 127 lbs, BMI: 19.3, Temperature: 98.3 F, Pulse: 59 bpm, Respiratory Rate: 18 breaths/min, Blood Pressure: 110/54 mmHg. Eyes Conjunctivae clear. No discharge.no icterus. Respiratory work of breathing is normal. Cardiovascular It will pulses are palpable. Musculoskeletal She inverts at the left ankle. General Notes: Wound exam; left medial malleolus under illumination looks a lot better. No debridement was required. There is no erythema around the wound. Edema in the area is quite good and her peripheral pulses  are palpable Integumentary (Hair, Skin) Wound #3 status is Open. Original cause of wound was Gradually Appeared. The wound is located on the Left,Medial Malleolus. The wound measures 1.2cm length x 0.8cm width x 0.1cm depth; 0.754cm^2 area and 0.075cm^3 volume. There is Fat Layer (Subcutaneous Tissue) Exposed exposed. There is no tunneling or undermining noted. There is a small amount of serosanguineous drainage noted. The wound margin is flat and intact. There is large (67-100%) red, pink granulation within the wound bed. There is a small (1-33%) amount of necrotic tissue within the wound bed including Adherent Slough. 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 There was a Haematologist Compression Therapy Procedure  by Deon Pilling, RN. Post procedure Diagnosis Wound #3: Same as Pre-Procedure Plan Follow-up Appointments: Return Appointment in 1 week. - Friday Dressing Change Frequency: Wound #3 Left,Medial Malleolus: Do not change entire dressing for one week. - change dressing in one week Skin Barriers/Peri-Wound Care: Moisturizing lotion TCA Cream or Ointment - mixed with lotion Wound Cleansing: Clean wound with Wound Cleanser - clean with anasept with dressing changes May shower with protection. Primary Wound Dressing: Wound #3 Left,Medial Malleolus: Skin Substitute Application - Running IVR for Apligraft Hydrofera Blue - classic Secondary Dressing: Wound #3 Left,Medial Malleolus: Dry Gauze Edema Control: Unna Boot to Left Lower Extremity - no kerlix layer Avoid standing for long periods of time Elevate legs to the level of the heart or above for 30 minutes daily and/or when sitting, a frequency of: - throughout the day Support Garment  20-30 mm/Hg pressure to: - compression stocking right leg daily 1. I continued with Hydrofera Blue 2. For now we will hold the IVR for Apligraf as the wound looks a lot better today. 3. We are using an Haematologist 4. She asked me about her left ankle. She has been to several doctors about this including neurology and orthopedics. By my understanding she has a peripheral neuropathy but the exact etiology of this is unclear. She asked me about who had [facility]/podiatry might be helpful to her. I think she is going to need some form of brace the brace the ankle. I would not want this done unless we can heal the wound in this area though. Electronic Signature(s) Signed: 07/29/2019 5:26:28 PM By: Linton Ham MD Entered By: Linton Ham on 07/29/2019 11:29:25 -------------------------------------------------------------------------------- SuperBill Details Patient Name: Date of Service: [name] 07/29/2019 Medical Record [id number] Patient Account Number: [id number] Date of Birth/Sex: Treating RN: [birth year] (64 y.o. 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

## 2019-08-05 ENCOUNTER — Other Ambulatory Visit: Payer: Self-pay

## 2019-08-05 ENCOUNTER — Encounter (HOSPITAL_BASED_OUTPATIENT_CLINIC_OR_DEPARTMENT_OTHER): Payer: Medicare Other | Attending: Internal Medicine | Admitting: Internal Medicine

## 2019-08-05 DIAGNOSIS — Z794 Long term (current) use of insulin: Secondary | ICD-10-CM | POA: Insufficient documentation

## 2019-08-05 DIAGNOSIS — G8929 Other chronic pain: Secondary | ICD-10-CM | POA: Diagnosis not present

## 2019-08-05 DIAGNOSIS — E1022 Type 1 diabetes mellitus with diabetic chronic kidney disease: Secondary | ICD-10-CM | POA: Diagnosis not present

## 2019-08-05 DIAGNOSIS — E10622 Type 1 diabetes mellitus with other skin ulcer: Secondary | ICD-10-CM | POA: Insufficient documentation

## 2019-08-05 DIAGNOSIS — E1051 Type 1 diabetes mellitus with diabetic peripheral angiopathy without gangrene: Secondary | ICD-10-CM | POA: Insufficient documentation

## 2019-08-05 DIAGNOSIS — I509 Heart failure, unspecified: Secondary | ICD-10-CM | POA: Diagnosis not present

## 2019-08-05 DIAGNOSIS — L97322 Non-pressure chronic ulcer of left ankle with fat layer exposed: Secondary | ICD-10-CM | POA: Diagnosis not present

## 2019-08-05 DIAGNOSIS — J449 Chronic obstructive pulmonary disease, unspecified: Secondary | ICD-10-CM | POA: Insufficient documentation

## 2019-08-05 DIAGNOSIS — N186 End stage renal disease: Secondary | ICD-10-CM | POA: Insufficient documentation

## 2019-08-05 DIAGNOSIS — Z7401 Bed confinement status: Secondary | ICD-10-CM | POA: Diagnosis not present

## 2019-08-05 NOTE — Progress Notes (Signed)
[name], [name] ([id number]) Visit Report for 08/05/2019 HPI Details Patient Name: Date of Service: [name], [name] 08/05/2019 10:15 AM Medical Record [id number] Patient Account Number: [id number] Date of Birth/Sex: Treating RN: [birth year] (64 y.o. [name] Primary Care Provider: Lennie [name] Other Clinician: Referring Provider: Treating Provider/Extender:[name] in Treatment: 347 History of Present Illness HPI Description: the remaining wound is over the left medial ankle. Similar wound over the right medial ankle healed largely with use of Apligraf. Most recently we have been using Hydrofera Blue over this wound with considerable improvement. The patient has been extensively worked up in the past for her venous insufficiency and she is not a candidate for antireflux surgery although I have none of the details available currently. 08/24/14; considerable improvement today. About 50% of this wound areas now epithelialized. The base of the wound appears to be healthier granulation.as opposed to last week when she had deteriorated a considerable improvement 08/17/14; unfortunately the wound has regressed somewhat. The areas of epithelialization from the superior aspect are not nearly as healthy as they were last week. The patient thinks her Hydrofera Blue slipped. 09/07/14; unfortunately the area has markedly regressed in the 2 weeks since I've seen this. There is an [name] surrounding erythema. The healthy granulation tissue that we had at the base of the wound now is a dusky color. The nurse reports green drainage 09/14/14; the area looks somewhat better than last week. There is less erythema and less drainage. The culture I did did not show any growth. Nevertheless I think it is better to continue the Cipro and doxycycline for a further week. The remaining wound area was debridement. 09/21/14. Wound did not require debridement last week. Still less  erythema and less drainage. She can complete her antibiotics. The areas of epithelialization in the superior aspect of the wound do not look as healthy as they did some weeks ago 10/05/14 continued improvement in the condition of this wound. There is advancing epithelialization. Less aggressive debridement required 10/19/14 continued improvement in the condition and volume of this wound. Less aggressive debridement to the inferior part of this to remove surface slough and fibrinous eschar 11/02/14 no debridement is required. The surface granulation appears healthy although some of her islands of epithelialization seem to have regressed. No evidence of infection 11/16/14; lites surface debridement done of surface eschar. The wound does not look to be unhealthy. No evidence of infection. Unfortunately the patient has had podiatry issues in the right foot and for some reason has redeveloped small surface ulcerations in the medial right ankle. Her original presentation involved wounds in this area 11/23/14 no debridement. The area on the right ankle has enlarged. The left ankle wound appears stable in terms of the surface although there is periwound inflammation. There has been regression in the amount of new skin 11/30/14 no debridement. Both wound areas appear healthy. There was no evidence of infection. The the new area on the right medial ankle has enlarged although that both the surfaces appear to be stable. 12/07/14; Debridement of the right medial ankle wound. No no debridement was done on the left. 12/14/14 no major change in and now bilateral medial ankle wounds. Both of these are very painful but the no overt evidence of infection. She has had previous venous ablation 12/21/14; patient states that her right medial ankle wound is considerably more painful last week than usual. Her left is also somewhat painful. She could not tolerate debridement. The right medial ankle  wound has fibrinous  surface eschar 12/28/14 this is a patient with severe bilateral venous insufficiency ulcers. For a considerable period of time we actually had the one on the right medial ankle healed however this recently opened up again in June. The left medial ankle wound has been a refractory area with some absent flows. We had some success with Hydrofera Blue on this area and it literally closed by 50% however it is recently opened up Foley. Both of these were debridement today of surface eschar. She tolerates this poorly 01/25/15: No change in the status of this. Thick adherent escar. Very poor tolerance of any attempt at debridement. I had healed the right medial malleolus wound for a considerable amount of time and had the left one down to about 50% of the volume although this is totally regressed over the last 48 weeks. Further the right leg has reopened. she is trying to make a appointment with pain and vascular, previous ablations with Dr. Aleda [name]. I do not believe there is an arterial insufficiency issue here 02/01/15 the status of the adherent eschar bilaterally is actually improved. No debridement was done. She did not manage to get vascular studies done 02/08/15 continued debridement of the area was done today. The slough is less adherent and comes off with less pressure. There is no surrounding infection peripheral pulses are intact 02/15/15 selective debridement with a disposable curette. Again the slough is less adherent and comes off with less difficulty. No surrounding infection peripheral pulses are intact. 02/22/15 selective debridement of the right medial ankle wound. Slough comes off with less difficulty. No obvious surrounding infection peripheral pulses are intact I did not debridement the one on the left. Both of these are stable to improved 03/01/15 selective debridement of both wound areas using a curette to. Adherent slough cup soft with less difficulty. No obvious surrounding  infection. The patient tells me that 2 days ago she noted a rash above the right leg wrap. She did not have this on her lower legs when she change this over she arrives with widespread left greater than right almost folliculitis-looking rash which is extremely pruritic. I don't see anything to culture here. There is no rash on the rest of her body. She feels well systemically. 03/08/15; selective debridement of both wounds using a curette. Base of this does not look unhealthy. She had limegreen drainage coming out of the left leg wound and describes a lot of drainage. The rash on her left leg looks improved to. No cultures were done. 03/22/15; patient was not here last week. Basal wounds does not look healthy and there is no surrounding erythema. No drainage. There is still a rash on the left leg that almost looks vasculitic however it is clearly limited to the top of where the wrap would be. 04/05/15; on the right required a surgical debridement of surface eschar and necrotic subcutaneous tissue. I did not debridement the area on the left. These continue to be large open wounds that are not changing that much. We were successful at one point in healing the area on the right, and at the same time the area on the left was roughly half the size of current measurements. I think a lot of the deterioration has to do with the prolonged time the patient is on her feet at work 04/19/15 I attempted-like surface debridement bilaterally she does not tolerate this. She tells me that she was in allergic care yesterday with extreme pain over her left lateral  malleolus/ankle and was told that she has an "sprain" 05/03/15; large bilateral venous insufficiency wounds over the medial malleolus/medial aspect of her ankles. She complains of copious amounts of drainage and his usual large amounts of pain. There is some increasing erythema around the wound on the right extending into the medial aspect of her foot to.  historically she came in with these wounds the right one healed and the left one came down to roughly half its current size however the right one is reopened and the left is expanded. 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 05/10/15 large bilateral venous insufficiency wounds. There is less adherence surface left however the surface culture that I did last week grew pseudomonas therefore bilateral selective debridement score necessary. There is surrounding erythema. The patient describes severe bilateral drainage and a lot of pain in the left ankle. Apparently her podiatrist was were ready to do a cortisone shot 05/17/15; the patient complains of pain and again copious amounts of drainage. 05/24/15; we used Iodo flex last week. Patient notes considerable improvement in wound drainage. Only needed to change this once. 05/31/15; we continued Iodoflex; the base of these large wounds bilaterally is not too bad but there is probably likely a significant bioburden here. I would like to debridement just doesn't tolerate it. 06/06/14 I would like to continue the Iodoflex although she still hasn't managed to obtain supplies. She has bilateral medial malleoli or large wounds which are mostly superficial. Both of them are covered circumferentially with some nonviable fibrinous slough although she tolerates debridement very poorly. She apparently has an appointment for an ablation on the right leg by interventional radiology. 06/14/15; the patient arrives with the wounds and static condition. We attempted a debridement although she does not do well with this secondary to pain. I 07/05/15; wounds are not much smaller however there appears to be a cleaner granulating base. The left has tight fibrinous slough greater than the right. Debridement is tolerated poorly due to pain. Iodoflex is done more for these wounds in any of the multitude of different dressings I  have tried on the left 1 and then subsequently the right. 07/12/15; no change in the condition of this wound. I am able to do an aggressive debridement on the right but not the left. She simply cannot tolerate it. We have been using Iodoflex which helps somewhat. It is worthwhile remembering that at one point we healed the right medial ankle wound and the left was about 25% of the current circumference. We have suggested returning to vascular surgery for review of possible further ablations for one reason or another she has not been able to do this. 07/26/15 no major change in the condition of either wound on her medial ankle. I did not attempt to debridement of these. She has been aggressively scrubbing these while she is in the shower at home. She has her supply of Iodoflex which seems to have done more for these wounds then anything I have put on recently. 08/09/15 wound area appears larger although not verified by measurements. Using Iodoflex 09/05/2015 -- she was here for avisit today but had significant problems with the wound and I was asked to see her for a physician opinion. I have summarize that this lady has had surgery on her left lower extremity about 10 years ago where the possible veins stripping was done. She has had an opinion from interventional radiology  around November 2016 where no further sclerotherapy was ordered. The patient works 12 hours a day and stands on a concrete floor with work boots and is unable to get the proper compression she requires and cannot elevate her limbs appropriately at any given time. She has recently grown Pseudomonas from her wound culture but has not started her ciprofloxacin which was called in for her. 09/13/15 this continues to be a difficult situation for this patient. At one point I had this wound down to a 1.5 x 1.5" wound on her left leg. This is deteriorated and the right leg has reopened. She now has substantial wounds on her medial calcaneus,  malleoli and into her lower leg. One on the left has surface eschar but these are far too painful for me to debridement here. She has a vascular surgery appointment next week to see if anything can be done to help here. I think she has had previous ablations several years ago at [facility]. She has no major edema. She tells me that she did not get product last time [facility] and went for several days without it. She continues to work in work boots 12 hours a day. She cannot get compression/4-layer under her work boots. 09/20/15 no major change. Periwound edema control was not very good. Her point with pain and vascular is next Wednesday the 25th 09/28/15; the patient is seen vascular surgery and is apparently scheduled for repeat duplex ultrasounds of her bilateral lower legs next week. 10/05/15; the patient was seen by Dr. Doren Custard of vascular surgery. He feels that she should have arterial insufficiency excluded as cause/contributed to her nonhealing stage she is therefore booked for an arteriogram. She has apparently monophasic signals in the dorsalis pedis pulses. She also of course has known severe chronic venous insufficiency with previous procedures as noted previously. I had another long discussion with the patient today about her continuing to work 12 hour shifts. I've written her out for 2 months area had concerns about this as her work location is currently undergoing significant turmoil and this may lead to her termination. She is aware of this however I agree with her that she simply cannot continue to stand for 12 hours multiple days a week with the substantial wound areas she has. 10/19/15; the Dr. Doren Custard appointment was largely for an arteriogram which was normal. She does not have an arterial issue. He didn't make a comment about her chronic venous insufficiency for which she has had previous ablations. Presumably it was not felt that anything additional could be done. The patient is  now out of work as I prescribed 2 weeks ago. Her wounds look somewhat less aggravated presumably because of this. I felt I would give debridement another try today 10/25/15; no major change in this patient's wounds. We are struggling to get her product that she can afford into her own home through her insurance. 11/01/15; no major change in the patient's wounds. I have been using silver alginate as the most affordable product. I spoke to Dr. Marla Roe last week with her requested take her to the OR for surgical debridement and placement of ACEL. Dr. Marla Roe told me that she would be willing to do this however [facility] will not cover this, fortunately the patient has [place] healthcare of some variant 11/08/15; no major change in the patient's wounds. She has been completely nonviable surface that this but is in too much pain with any attempted debridement are clinic. I have  arranged for her to see Dr. Marla Roe ham of plastic surgery and this appointment is on Monday. I am hopeful that they will take her to the OR for debridement, possible ACEL ultimately possible skin graft 11/22/15 no major change in the patient's wounds over her bilateral medial calcaneus medial malleolus into the lower legs. Surface on these does not look too bad however on the left there is surrounding erythema and tenderness. This may be cellulitis or could him sleepy tinea. 11/29/15; no major changes in the patient's wounds over her bilateral medial malleolus. There is no infection here and I don't think any additional antibiotics are necessary. There is now plan to move forward. She sees Dr. Marla Roe in a week's time for preparation for operative debridement and ACEL placement I believe on 7/12. She then has a follow-up appointment with Dr. Marla Roe on 7/21 12/28/15; the patient returns today having been taken to the [facility] by Dr. Marla Roe 12/12/15 she underwent debridement, intraoperative cultures [which were  negative]. She had placement of a wound VAC. Parent really ACEL was not available to be placed. The wound VAC foam apparently adhered to the wound since then she's been using silver alginate, Xeroform under Ace wraps. She still says there is a lot of drainage and a lot of pain 01/31/16; this is a patient I see monthly. I had referred her to Dr. Marla Roe him of plastic surgery for large wounds on her bilateral medial ankles. She has been to the OR twice once in early July and once in early August. She tells me over the last 3 weeks she has been using the wound VAC with ACEL underneath it. On the right we've simply been using silver alginate. Under Kerlix Coban wraps. 02/28/16; this is a patient I'm currently seeing monthly. She is gone on to have a skin graft over her large venous insufficiency ulcer on the left medial ankle. This was done by Dr. Marla Roe him. The patient is a bit perturbed about why she didn't have one on her right medial ankle wound. She has been using silver alginate to this. 03/06/16; I received a phone call from her plastic surgery Dr. Marla Roe. She expressed some concern about the viability of the skin graft she did on the left medial ankle wound. Asked me to place Endoform on this. She told me she is not planning to do a subsequent skin graft on the right as the left one did not take very well. I had placed Hydrofera Blue on the right 03/13/16; continue to have a reasonably healthy wound on the right medial ankle. Down to 3 mm in terms of size. There is epithelialization here. The area on the left medial ankle is her skin graft site. I suppose the last week this looks somewhat better. She has an open area inferiorly however in the center there appears to be some viable tissue. There is a lot of surface callus and eschar that will eventually need to come off however none of this looked to be infected. Patient states that the is able to keep the dressing on for several days  which is an improvement. 03/20/16 no major change in the circumference of either wound however on the left side the patient was at Dr. Eusebio Friendly office and they did a debridement of left wound. 50% of the wound seems to be epithelialized. I been using Endoform on the left Hydrofera Blue in the right 03/27/16; she arrives today with her wound is not looking as healthy as they did  last week. The area on the right clearly has an adherent surface to this a very similar surface on the left. Unfortunately for this patient this is all too familiar problem. Clearly the Endoform is not working and will need to change that today that has some potential to help this surface. She does not tolerate debridement in this clinic very well. She is changing the dressing wants 04/03/16; patient arrives with the wounds looking somewhat better especially on the right. Dr. Migdalia Dk change the dressing to silver alginate when she saw her on Monday and also sold her some compression socks. The usefulness of the latter is really not clear and woman with severely draining wounds. 04/10/16; the patient is doing a bit of an experiment wearing the compression stockings that Dr. Migdalia Dk provided her to her left leg and the out of legs based dressings that we provided to the right. 05/01/16; the patient is continuing to wear compression stockings Dr. Migdalia Dk provided her on the left that are apparently silver impregnated. She has been using Iodoflex to the right leg wound. Still a moderate amount of drainage, when she leaves here the wraps only last for 4 days. She has to change the stocking on the left leg every night 05/15/16; she is now using compression stockings bilaterally provided by Dr. Marla Roe. She is wearing a nonadherent layer over the wounds so really I don't think there is anything specific being done to this now. She has some reduction on the left wound. The right is stable. I think all healing here is being done  without a specific dressing 06/09/16; patient arrives here today with not much change in the wound certainly in diameter to large circular wounds over the medial aspect of her ankle bilaterally. Under the light of these services are certainly not viable for healing. There is no evidence of surrounding infection. She is wearing compression stockings with some sort of silver impregnation as prescribed by Dr. Marla Roe. She has a follow-up with her tomorrow. 06/30/16; no major change in the size or condition of her wounds. These are still probably covered with a nonviable surface. She is using only her purchase stockings. She did see Dr. Marla Roe who seemed to want to apply Dakin's solution to this I'm not extreme short what value this would be. I would suggest Iodoflex which she still has at home. 07/28/16; I follow Mrs. Guess episodically along with Dr. Marla Roe. 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 She has been applying a topical collagen ointment to both wounds with Adaptic. I don't think Dr. Marla Roe is planning to take her back to the OR. 08/19/16; I follow Mrs. Jeneen Rinks on a monthly basis along with Dr. Marla Roe of plastic surgery. 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 I been following her for a number of years. At one point I was able to get the right medial malleolus wound to heal and had the left medial malleolus down to about half its current size however and I had to send her to plastic surgery for an operative debridement. Since then things have been stable to slightly improve the area on the right is slightly better one in the left about the same although there is much less adherent surface than I'm used to with this patient. She is using some form of liquid collagen gel that Dr. Marla Roe provided a Kerlix cover with the patient's own  pressure stockings. She tells me that she has extreme pain in both ankles and along the lateral aspect of both feet. She has been unable to work for some period of time. She is telling me she is retiring at the beginning of April. She sees Dr. Doran Durand of orthopedics next week 09/22/16; patient has not seen Dr. Marla Roe since the last time she is here. I'm not really sure what she is using to the wounds other than bits and pieces of think she had left over including most recently Hydrofera Blue. She is using juxtalite stockings. She is having difficulty with her husband's recent illness "stroke". She is having to transport him to various doctors appointments. Dr. Marla Roe left her the option of a repeat debridement with ACEL however she has not been able to get the time to follow-up on this. She continues to have a fair amount of drainage out of these wounds with certainly precludes leaving dressings on all week 10/13/16; patient has not seen Dr. Marla Roe since she was last in our clinic. I'm not really sure what she is doing with the wounds, we did try to get her [facility] and I think she is actually using this most of the time. Because of drainage she states she has to change this every second day although this is an improvement from what she used to do. She went to see Dr. Doran Durand who did not think she had a muscular issue with regards to her feet, he referred her to a neurologist and I think the appointment is sometime in June. I changed her back to Iodoflex which she has used in the past but not recently. 11/03/16; the patient has been using Iodoflex although she ran out of this. Still claims that there is a lot of drainage although the wound does not look like this. No surrounding erythema. She has not been back to see Dr. Marla Roe 11/24/16; the patient has been using Iodoflex again but she ran out of it 2 or 3 days ago. There is no major change in the condition of either one of these  wounds in fact they are larger and covered in a thick adherent surface slough/nonviable tissue especially on the left. She does not tolerate mechanical debridement in our clinic. Going back to see Dr. Marla Roe of plastic surgery for an operative debridement would seem reasonable. 12/15/16; the patient has not been back to see Dr. Marla Roe. She is been dealing with a series of illnesses and her husband which of monopolized her time. She is been using Sorbact which we largely supplied. She states the drainage is bad enough that it maximum she can go 2-3 days without changing the dressing 01/12/2017 -- the patient has not been back for about 4 weeks and has not seen Dr. Marla Roe not does she have any appointment pending. 01/23/17; patient has not seen Dr. Marla Roe even though I suggested this previously. She is using Santyl that was suggested last week by Dr. Con Memos this Cost her $16 through her insurance which is indeed surprising 02/12/17; continuing Santyl and the patient is changing this daily. A lot of drainage. She has not been back to see plastic surgery she is using an Ace wrap. Our intake nurse suggested wrap around stockings which would make a good reasonable alternative 02/26/17; patient is been using Santyl and changing this daily due to drainage. She has not been to see plastic surgery she uses in April Ace wrap to control the edema. She did obtain extremitease stockings  but stated that the edema in her leg was to big for these 03/20/17; patient is using Santyl and Anasept. Surfaces looked better today the area on the right is actually measuring a little smaller. She has states she has a lot of pain in her feet and ankles and is asking for a consult to pain control which I'll try to help her with through our case manager. 04/10/17; the patient arrives with better-looking wound surfaces and is slightly smaller wound on the left she is using a combination of Santyl and Anasept. She has  an appointment or at least as started in the pain control center associated with [facility] 05/14/17; this is a patient who I followed for a prolonged period of time. 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 At one point I had this down to a much smaller wound on the left however these reopened and we've never been able to get these to heal. She has been using Santyl and Anasept gel although 2 weeks ago she ran out of the Anasept gel. She has a stable appearance of the wound. She is going to the wound care clinic at [facility]. They wanted do a nerve block/spinal block although she tells me she is reluctant to go forward with that. 05/21/17; this is a patient I have followed for many years. 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 Chronic pain and deformity in her ankles as well. She is been to see plastic surgery as well as orthopedics. Using PolyMem AG most recently/Kerramax/ABDs and 2 layer compression. She has managed to keep this on and she is coming in for a nurse check to change the dressing on Tuesdays, we see her on Fridays 06/05/17; really quite a good looking surface and the area especially on the right medial has contracted in terms of dimensions. Well granulated healthy-looking tissue on both sides. Even with an open curet there is nothing that even feels abnormal here. This is as good as I've seen this in quite some time. We have been using PolyMem AG and bringing her in for a nurse check 06/12/17; really quite good surface on both of these wounds. The right medial has contracted a bit left is not. We've been using PolyMem and AG and she is coming in for a nurse visit 06/19/17; we have been using PolyMem AG and bringing her in for a nurse check. Dimensions of her wounds are not better but the surfaces looked better bilaterally. She complained of bleeding last night and the left wound and increasing pain bilaterally. She  states her wound pain is more neuropathic than just the wounds. There was some suggestion that this was radicular from her pain management doctor in talking to her it is really difficult to sort this out. 06/26/17; using PolyMem and AG and bringing her in for a nurse check as All of this and reasonably stable condition. Certainly not improved. The dimensions on the lateral part of the right leg look better but not really measuring better. The medial aspect on the left is about the same. 07/03/16; we have been using PolyMen AG and bringing her in for a nurse check to change the dressings as the wounds have drainage which precludes once weekly changing. We are using all secondary absorptive dressings.our intake nurse is brought up the idea of using a wound VAC/snap VAC on the wound to help with the drainage to see if this would result in some contraction. This is  not a bad idea. The area on the right medial is actually looking smaller. Both wounds have a reasonable-looking surface. There is no evidence of cellulitis. The edema is well controlled 07/10/17; the patient was denied for a snap VAC by her insurance. The major issue with these wounds continues to be drainage. We are using wicked PolyMem AG and she is coming in for a nurse visit to change this. The wounds are stable to slightly improved. The surface looks vibrant and the area on the right certainly has shrunk in size but very slowly 07/17/17; the patient still has large wounds on her bilateral medial malleoli. Surface of both of these wounds looks better. The dimensions seem to come and go but no consistent improvement. There is no epithelialization. We do not have options for advanced treatment products due to insurance issues. They did not approve of the wound VAC to help control the drainage. More recently we've been using PolyMem and AG wicked to allow drainage through. We have been bringing her in for a nurse visit to change this. We do not  have a lot of options for wound care products and the home again due to insurance issues 07/24/17; the patient's wound actually looks somewhat better today. No drainage measurements are smaller still healthy-looking surface. We used silver collagen under PolyMen started last week. We have been bringing her in for a dressing change 07/31/17; patient's wound surface continued to look better and I think there is visible change in the dimensions of the wound on the right. Rims of epithelialization. We have been using silver collagen under PolyMen and bringing her in for a dressing change. There appears to be less drainage although she is still in need of the dressing change 08/07/17. Patient's wound surface continues to look better on both sides and the area on the right is definitely smaller. We have been using silver collagen and PolyMen. She feels that the drainage has been it has been better. I asked her about her vascular status. She went to see Dr. Aleda [name] at [facility] and had some form of ablation. I don't have much detail on this. I haven't my notes from 2016 that she was not a candidate for any further ablation but I don't have any more information on this. We had referred her to vein and vascular I don't think she ever went. He does not have a history of PAD although I don't have any information on this either. We don't even have ABIs in our record 08/14/17; we've been using silver collagen and PolyMen cover. And putting the patient and compression. She we are bringing her in as a nurse visit to change this because ofarge amount of drainage. We didn't the ABIs in clinic today since they had been done in many moons 1.2 bilaterally. She has been to see vein and vascular however this was at [facility] and she had ablation although I really don't have any information on this all seemed biking get a report. She is also been operatively debrided by plastic surgery and had a cell placed  probably 8-12 months ago. This didn't have a major effect. We've been making some gains with current dressings 08/19/17-She is here in follow-up evaluation for bilateral medial malleoli ulcers. She continues to tolerate debridement very poorly. We will continue with recently changed topical treatment; if no significant improvement may consider switching to Iodosorb/Iodoflex. 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 She has been using silver  collagen and PolyMem. I believe she has been used and tried on Iodoflex before. 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 These have been very refractory over time. We have been using silver collagen and PolyMen. She is coming in once a week for a doctor's and once a week for nurse visits. We are actually making some progress 09/18/17; the patient's wounds are smaller especially on the right medial. She arrives today to upset to consider even washing these off with Anasept which I think is been part of the reason this is been closing. We've been using collagen covered in PolyMen otherwise. It is noted that she has a small area of folliculitis on the right medial calf that. As we are wrapping her legs I'll give her a short course of doxycycline to make sure this doesn't amount to anything. She is a long list of complaints today including imbalance, shortness of breath on exertion, inversion of her left ankle. With regards to the latter complaints she is been to see orthopedics and they offered her a tendon release surgery I believe but wanted her wounds to be closed first. I have recommended she go see her primary physician with regards to everything else. 09/25/17; patient's wounds are about the same size. We have made  some progress bilaterally although not in recent weeks. She will not allow me T wash these wounds with Anasept even if she is doing her cell. Wheeze we've been using collagen covered in PolyMen. Last week she had a small area of folliculitis this is now opened into a small wound. She completed 5 days of trimethoprim sulfamethoxazole 10/02/17; unfortunately the area on her left medial ankle is worse with a larger wound area towards the Achilles. The patient complains of a lot of pain. She will not allow debridement although visually I don't think there is anything to debridement in any case. We have been using silver collagen and PolyMen for several months now. Initially we are making some progress although I'm not really seeing that today. We will move back to [facility]. His admittedly this is a bit of a repeat however I'm hoping that his situation is different now. The patient tells me she had her leg on the left give out on her yesterday this is process some pain. 10/09/17; the patient is seen twice a week largely because of drainage issues coming out of the chronic medial bimalleolar wounds that are chronic. Last week the dimensions of the one on the left looks a little larger I changed her to [facility]. She comes in today with a history of terrible pain in the bilateral wound areas. She will not allow debridement. She will not even allow a tissue culture. There is no surrounding erythema no no evidence of cellulitis. We have been putting her Kerlix Coban man. She will not allow more aggressive compression as there was a suggestion to put her in 3 layer wraps. 10/16/17; large wounds on her bilateral medial malleoli. These are chronic. Not much change from last week. The surface looks have healthy but absolutely no epithelialization. A lot of pain little less so of drainage. She will not allow debridement or even washing these off in the vigorous fashion with Anasept. 10/23/17; large wounds  on her bilateral malleoli which are chronic. Some improvement in terms  of size perhaps on the right since last time I saw these. She states that after we increased the 3 layer compression there was some bleeding, when she came in for a nurse visit she did not want 3 layer compression put back on about our nurse managed to convince her. She has known chronic venous visit issues and I'm hoping to get her to tolerate the 3 layer compression. using Hydrofera Blue 10/30/17; absolutely no change in the condition of either wound although we've had some improvement in dimensions on the right.. Attempted to put her in 3 layer compression she didn't tolerated she is back in 2 layer compression. We've been using Hydrofera Blue We looked over her past records. She had venous reflux studies in November 2016. There was no evidence of deep venous reflux on the right. Superficial vein did not show the greater saphenous vein at think this is been previously ablated the small saphenous vein was within normal limits. The left deep venous system showed no DVT the vessels were positive for deep venous reflux in the posterior tibial veins at the ankle. The greater saphenous vein was surgically absent small saphenous vein was within normal limits. She went to vein and vascular at [facility]. I believe she had an ablation on the left greater saphenous vein. I'll update her reflux studies perhaps ever reviewed by vein and vascular. We've made absolutely no progress in these wounds. Will also try to read and TheraSkins through her insurance 11/06/17; W the patient apparently has a 2 week follow-up with vein and vascular I like him to review the whole issue with regards to her previous vascular workup by Dr. Aleda [name]. We've really made no progress on these wounds in many months. She arrives today with less viable looking surface on the left medial ankle wound. This was apparently looking about the same on Tuesday when  she was here for nurse visit. 11/13/17; deep tissue culture I did last time of the left lower leg showed multiple organisms without any predominating. In particular no Staphylococcus or group A strep were isolated. We sent her for venous reflux studies. She's had a previous left greater saphenous vein stripping and I think sclerotherapy of the right greater saphenous vein. She didn't really look at the lesser saphenous vein this both wounds are on the medial aspect. She has reflux in the common femoral vein and popliteal vein and an accessory vein on the right and the common femoral vein and popliteal vein on the left. I'm going to have her go to see vein and vascular just the look over things and see if anything else beside aggressive compression is indicated here. We have not been able to make any progress on these wounds in spite of the fact that the surface of the wounds is never look too bad. 11/20/17; no major change in the condition of the wounds. Patient reports a large amount of drainage. She has a lot of complaints of pain although enlisting her today I wonder if some of this at least his neuropathic rather than secondary to her wounds. She has an appointment with vein and vascular on 12/30/17. The refractory nature of these wounds in my mind at least need vein and vascular to look over the wounds the recent reflux studies we did and her history to see if anything further can be done here. I also note her gait is deteriorated quite a bit. 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 She has  a bilateral Trendelenburg gait. I wonder if this is neuropathic or perhaps multilevel radicular. 11/27/17; her wounds actually looks slightly better. Healthy-looking granulation tissue a scant amount of epithelialization. [place] healthcare will not pay for [employer]. They will play for tri layer Oasis and Dermagraft. This is not a diabetic ulcer. We'll try for the tri layer Oasis. She still  complains of some drainage. She has a vein and vascular appointment on 12/30/17 12/04/17; the wounds visually look quite good. Healthy-looking granulation with some degree of epithelialization. We are still waiting for response to our request for trial to try layer Oasis. Her appointment with vascular to review venous and arterial issues isn't sold the end of July 7/31. Not allow debridement or even vigorous cleansing of the wound surface. 12/18/17; slightly smaller especially on the right. 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 We've been using Hydrofera Blue. We still are looking into triple layer Oasis through her insurance 01/08/18 on evaluation today patient's wound actually appears to be showing signs of good improvement at this point in time. She has been tolerating the dressing changes without complication. Fortunately there does not appear to be any evidence of infection at this point in time. We have been utilizing silver nitrate which does seem to be of benefit for her which is also good news. Overall I'm very happy with how things seem to be both regards appearance as well as measurement. Patient did see Dr. Bridgett Larsson for evaluation on 12/30/17. In his assessment he felt that stripping would not likely add much more than chronic compression to the patient's healing process. His recommendation was to follow-up in three months with Dr. Doren Custard if she hasn't healed in order to consider referral back to you and see vascular where she previously was in a trial and was able to get her wound to heal. I'll be see what she feels she when you staying compression and he reiterated this as well. 01/13/18 on evaluation today patient appears to actually be doing very well in regard to her bilateral medial malleolus ulcers. She seems to have tolerated the chemical cauterization with silver nitrate last week she did have some pain through that evening but fortunately states that I'll  be see since it seems to be doing better she is overall pleased with the progress. 01/21/18; really quite a remarkable improvement since I've last seen these wounds. We started using silver nitrate specially on the islands of hyper granulation which for some reason her around the wound circumference. This is really done quite nicely. Primary dressing Hydrofera Blue under 4 layer compression. She seems to be able to hold out without a nurse rewrap. Follow-up in 1 week 01/28/18; we've continued the hydrofera blue but continued with chemical cauterization to the wound area that we started about a month ago for irregular hyper granulation. She is made almost stunning improvement in the overall wound dimensions. I was not really expecting this degree of improvement in these chronic wounds 02/05/18; we continue with Hydrofera Bluebut of also continued the aggressive chemical cauterization with silver nitrate. We made nice progress with the right greater than left wound. 02/12/18. We continued with Hydrofera Blue after aggressive chemical cauterization with silver nitrate. We appear to be making nice progress with both wound areas 02/19/2018; we continue with [facility] after washing the wounds vigorously with Anasept spray and chemical cauterization with silver nitrate. We are making excellent progress. The area on the right's just about closed 02/26/2018. The area on the left medial  ankle had too much necrotic debris today. I used a #5 curette we are able to get most of the soft. I continued with the silver nitrate to the much smaller wound on the right medial ankle she had a new area on her right lower pretibial area which she says was due to a role in her compression 03/05/2018; both wound areas look healthy. Not much change in dimensions from last week. I continue to use silver nitrate and Hydrofera Blue. The patient saw Dr. Doren Custard of vein and vascular. He felt she had venous stasis ulcers. He felt  based on her previous arteriogram she should have adequate circulation for healing. Also she has deep venous reflux but really no significant correctable superficial venous reflux at this time. He felt we should continue with conservative management including leg elevation and compression 04/02/2018; since we last saw this woman about a month ago she had a fall apparently suffered a pelvic fracture. I did not look up the x-ray. Nevertheless because of pain she literally was bedbound for 2 weeks and had home health coming out to change the dressing. Somewhat predictably this is resulted in considerable improvement in both wound areas. The right is just about closed on the medial malleolus and the left is about half the size. 04/16/2018; both her wounds continue to go down in size. Using Hydrofera Blue. 05/07/18; both her wounds appeared to be improving especially on the right where it is almost closed. We are using Hydrofera Blue 05/14/2018; slightly worse this week with larger wounds. Surface on the left medial not quite as good. We have been using Hydrofera Blue 05/21/18; again the wounds are slightly larger. Left medial malleolus slightly larger with eschar around the circumference. We have been using Hydrofera Blue undergoing a wraps for a prolonged period of time. This got a lot better when she was more recumbent due to a fall and a back injury. I change the primary dressing the silver alginate today. She did not tolerate a 4 layer compression previously although I may need to bring this up with her next time 05/28/2018; area on the left medial malleolus again is slightly larger with more drainage. Area on the right is roughly unchanged. She has a small area of folliculitis on the right medial just on the lower calf. This does not look ominous. 06/03/2018 left medial malleolus slightly smaller in a better looking surface. We used silver nitrate on this last time with silver alginate. The area  on the right appears slightly smaller 1/10; left medial malleolus slightly smaller. Small open area on the right. We used silver nitrate and silver alginate as of 2 weeks ago. We continue with the wound and compression. These got a lot better when she was off her feet 1/17; right medial malleolus wound is smaller. The left may be slightly smaller. Both surfaces look somewhat better. 1/24; both wounds are slightly smaller. Using silver alginate under Unna boots 1/31; both wounds appear smaller in fact the area on the right medial is just about closed. Surface eschar. We have been using silver alginate under Unna boots. The patient is less active now spends let much less time on her feet and I think this is contributed to the general improvement in the wound condition 2/7; both wounds appear smaller. I was hopeful the right medial would be closed however there there is still the same small open area. Slight amount of surface eschar on the left the dimensions are smaller there is eschar but  the wound edges appear to be free. We have been using silver alginate under Unna boot's 2/14; both wounds once again measure smaller. Circumferential eschar on the left medial. We have been using silver alginate under Unna boots with gradual improvement 2/21; the area on the right medial malleolus has healed. The area on the left is smaller. We have been using silver alginate and Unna boots. We can discharge wrapping the right leg she has 20/30 stockings at home she will need to protect the scar tissue in this area 2/28; the area on the right medial malleolus remains closed the patient has a compression stocking. The area on the left is smaller. We have been using silver alginate and Unna boots. 3/6 the area on the right medial ankle remains closed. Good edema control noted she is using her own compression stocking. The area on the left medial ankle is smaller. We have been managing this with silver alginate and  Unna boots which we will continue today. 3/13; the area on the right medial ankle remains closed and I'm declaring it healed today. When necessary the left is about the same still a healthy-looking surface but no major change and wound area. No evidence of infection and using silver alginate under unna and generally making considerable improvement 3/27 the area on the right medial ankle remains closed the area on the left is about the same as last week. Certainly not any worse we have been using silver alginate under an Unna boot 4/3; the area on the right medial ankle remains closed per the patient. We did not look at this wound. The wound on the left medial ankle is about the same surface looks healthy we have been using silver alginate under an Unna boot 4/10; area on the right medial ankle remains closed per the patient. We did not look at this wound. The wound on the left medial ankle is slightly larger. The patient complains that the [facility] caused burning pain all week. She also told us that she was a lot more active this week. Changed her back to silver alginate 4/17; right medial ankle still closed per the patient. Left medial ankle is slightly larger. Using silver alginate. She did not tolerate Hydrofera Blue on this area 4/24; right medial ankle remains closed we have not look at this. The left medial ankle continues to get larger today by about a centimeter. We have been using silver alginate under Unna boots. She complains about 4 layer compression as an alternative. She has been up on her feet working on her garden 5/8; right medial ankle remains closed we did not look at this. The left medial ankle has increased in size about 100%. We have been using silver alginate under Unna boots. She noted increased pain this week and was not surprised that the wound is deteriorated 5/15; no major change in SA however much less erythema ( one week of doxy ocellulitis). 5/22-64 year old  female returns at 1 week to the clinic for left medial ankle wound for which we have been using silver alginate under 3 layer compression She was placed on DOXY at last visit - the wound is wider at this visit. She is in 3 layer compression 5/29; change to [facility] last week. I had given her empiric doxycycline 2 weeks ago for a week. She is in 3 layer compression. She complains of a lot of pain and drainage on presentation today. 6/5; using Hydrofera Blue. I gave her doxycycline recently empirically for  erythema and pain around the wound. Believe her cultures showed enterococcus which not would not have been well covered by doxycycline nevertheless the wound looks better and I don't feel specifically that the enterococcus needs to be covered. She has a new what looks like a wrap injury on her lateral left ankle. 6/12; she is using Hydrofera Blue. She has a new area on the left anterior lower tibial area. This was a wrap injury last week. 6/19; the patient is using Hydrofera Blue. She arrived with marked inflammation and erythema around the wound and tenderness. 12/01/18 on evaluation today patient appears to be doing a little bit better based on what I'm hearing from the standpoint of lassos evaluation to this as far as the overall appearance of the wound is concerned. Then sometime substandard she typically sees Dr. Dellia Nims. Nonetheless overall very pleased with the progress that she's made up to this point. No fevers, chills, nausea, or vomiting noted at this time. 7/10; some improvement in the surface area. Aggressively debrided last week apparently. I went ahead with the debridement today although the patient does not tolerate this very well. We have been using Iodoflex. Still a fair amount of drainage 7/17; slightly smaller. Using Iodoflex. 7/24; no change from last week in terms of surface area. We have been using Iodoflex. Surface looks and continues to look somewhat better 7/31;  surface area slightly smaller better looking surface. We have been using Iodoflex. This is under Unna boot compression 8/7-Patient presents at 1 week with Unna boot and Iodoflex, wound appears better 8/14-Patient presents at 1 week with Iodoflex, we use the Unna boot, wound appears to be stable better.Patient is getting Botox treatment for the inversion of the foot for tendon release, Next week 8/21; we are using Iodoflex. Unna boot. The wound is stable in terms of surface area. Under illumination there is some areas of the wound that appear to be either epithelialized or perhaps this is adherent slough at this point I was not really clear. It did not wipe off and I was reluctant to debride this today. 8/28; we are using Iodoflex in an Unna boot. Seems to be making good improvement. 9/4; using Iodoflex and wound is slightly smaller. 9/18; we are using Iodoflex with topical silver nitrate when she is here. The wound continues to be smaller 10/2; patient missed her appointment last week due to GI issues. She left and Iodoflex based dressing on for 2 weeks. Wound is about the same size about the size of a dime on the left medial lower 10/9 we have been using Iodoflex on the medial left ankle wound. She has a new superficial probable wrap injury on the dorsal left ankle 10/16; we have been using Hydrofera Blue since last week. This is on the left medial ankle 10/23; we have been using Hydrofera Blue since 2 weeks ago. This is on the left medial ankle. Dimensions are better 11/6; using Hydrofera Blue. I think the wound is smaller but still not closed. Left medial ankle 11/13; we have been using Hydrofera Blue. Wound is certainly no smaller this week. Also the surface not as good. This is the remanent of a very large area on her left medial ankle. 11/20; using Sorbact since last week. Wound was about the same in terms of size although I was disappointed about the surface debris 12/11; 3-week  follow-up. Patient was on vacation. Wound is measuring slightly larger we have been using Sorbact. 12/18; wound is about the same size  however surface looks better last week after debridement. We have been using Sorbact under compression 1/15 wound is probably twice the size of last time increased in length nonviable surface. We have been using Sorbact. She was running a mild fever and missed her appointment last week 1/22; the wound is come down in size but under illumination still a very adherent debris we have been Hydrofera Blue that I changed her to last week 1/29; dimensions down slightly. We have been using Hydrofera Blue 2/19 dimensions are the same however there is rims of epithelialization under illumination. Therefore more the surface area may be epithelialized 2/26; the patient's wound actually measures smaller. The wound looks healthy. We have been using Hydrofera Blue. I had some thoughts about running Apligraf then I still may do that however this looks so much better this week we will delay that for now 3/5; the wound is small but about the same as last week. We have been using Hydrofera Blue. No debridement is required today. Electronic Signature(s) Signed: 08/05/2019 5:21:33 PM By: Linton Ham MD Entered By: Linton Ham on 08/05/2019 11:47:51 -------------------------------------------------------------------------------- Physical Exam Details Patient Name: Date of Service: [name], [name] 08/05/2019 10:15 AM Medical Record [id number] Patient Account Number: [id number] Date of Birth/Sex: Treating RN: 1955-07-01 (64 y.o. [name] Primary Care Provider: Lennie [name] Other Clinician: Referring Provider: Treating Provider/Extender:Dan Dissinger, [name] in Treatment: [phone] Cardiovascular Pedal pulses are palpable. No uncontrolled edema. Integumentary (Hair, Skin) No erythema around the wound. Notes Wound exam; left medial malleolus  under illumination. No debridement was required today the wound is small and appears to be epithelializing. Even though there was no direct change in surface area measurements I decided to give this another week of Hydrofera Blue. She tells me she has had an active week, use of this does better when she is not that active. Electronic Signature(s) Signed: 08/05/2019 5:21:33 PM By: Linton Ham MD Entered By: Linton Ham on 08/05/2019 11:49:42 -------------------------------------------------------------------------------- Physician Orders Details Patient Name: Date of Service: MARRIANA, HIBBERD 08/05/2019 10:15 AM Medical Record [id number] Patient Account Number: [id number] Date of Birth/Sex: Treating RN: September 17, 1955 (64 y.o. 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 - Friday Dressing Change Frequency Wound #3 Left,Medial Malleolus Do not change entire dressing for one week. - change dressing in one week Skin Barriers/Peri-Wound Care Moisturizing lotion TCA Cream or Ointment - mixed with lotion Wound Cleansing Clean wound with Wound Cleanser - clean with anasept with dressing changes May shower with protection. Primary Wound Dressing Wound #3 Left,Medial Malleolus Hydrofera Blue - classic Secondary Dressing Wound #3 Left,Medial Malleolus Dry Gauze Edema Control Unna Boot to Left Lower Extremity - no kerlix layer Avoid standing for long periods of time Elevate legs to the level of the heart or above for 30 minutes daily and/or when sitting, a frequency of: -  throughout the day Support Garment 20-30 mm/Hg pressure to: - compression stocking right leg daily Electronic Signature(s) Signed: 08/05/2019 5:21:33 PM By: Linton Ham MD Signed: 08/05/2019 5:33:53 PM By: Baruch Gouty RN, BSN Entered By: Baruch Gouty on 08/05/2019 11:31:20 -------------------------------------------------------------------------------- Problem List Details Patient Name: Date of Service: [name] 08/05/2019 10:15 AM  Medical Record [id number] Patient Account Number: [id number] Date of Birth/Sex: Treating RN: 06-23-1955 (64 y.o. 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 08/03/2014 08/03/2014 and inflammation L03.116 Cellulitis of left lower limb 09/07/2014 09/07/2014 J28.786 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 11/16/2014 11/16/2014 and inflammation Resolved Problems ICD-10 Code Description Active Date Resolved Date L97.312 Non-pressure chronic ulcer of right ankle with fat layer 04/10/2016 04/10/2016 exposed Electronic Signature(s) Signed: 08/05/2019 5:21:33 PM By: Linton Ham MD Entered By: Linton Ham on 08/05/2019 11:47:15 -------------------------------------------------------------------------------- Progress Note Details Patient Name: Date of Service: [name] 08/05/2019 10:15 AM Medical Record [id number] Patient Account Number: [id number] Date of  Birth/Sex: Treating RN: 1956-03-08 (64 y.o. [name] Primary Care Provider: Lennie [name] Other Clinician: Referring Provider: Treating Provider/Extender:Nell Gales, [name] in Treatment: 347 Subjective History of Present Illness (HPI) the remaining wound is over the left medial ankle. Similar wound over the right medial ankle healed largely with use of Apligraf. Most recently we have been using Hydrofera Blue over this wound with considerable improvement. The patient has been extensively worked up in the past for her venous insufficiency and she is not a candidate for antireflux surgery although I have none of the details available currently. 08/24/14; considerable improvement today. About 50% of this wound areas now epithelialized. The base of the wound appears to be healthier granulation.as opposed to last week when she had deteriorated a considerable improvement 08/17/14; unfortunately the wound has regressed somewhat. The areas of epithelialization from the superior aspect are not nearly as healthy as they were last week. The patient thinks her Hydrofera Blue slipped. 09/07/14; unfortunately the area has markedly regressed in the 2 weeks since I've seen this. There is an [name] surrounding erythema. The healthy granulation tissue that we had at the base of the wound now is a dusky color. The nurse reports green drainage 09/14/14; the area looks somewhat better than last week. There is less erythema and less drainage. The culture I did did not show any growth. Nevertheless I think it is better to continue the Cipro and doxycycline for a further week. The remaining wound area was debridement. 09/21/14. Wound did not require debridement last week. Still less erythema and less drainage. She can complete her antibiotics. The areas of epithelialization in the superior aspect of the wound do not look as healthy as they did some weeks ago 10/05/14 continued improvement in  the condition of this wound. There is advancing epithelialization. Less aggressive debridement required 10/19/14 continued improvement in the condition and volume of this wound. Less aggressive debridement to the inferior part of this to remove surface slough and fibrinous eschar 11/02/14 no debridement is required. The surface granulation appears healthy although some of her islands of epithelialization seem to have regressed. No evidence of infection 11/16/14; lites surface debridement done of surface eschar. The wound does not look to be unhealthy. No evidence of infection. Unfortunately the patient has had podiatry issues in the right foot and for some reason has redeveloped small surface ulcerations in the medial  right ankle. Her original presentation involved wounds in this area 11/23/14 no debridement. The area on the right ankle has enlarged. The left ankle wound appears stable in terms of the surface although there is periwound inflammation. There has been regression in the amount of new skin 11/30/14 no debridement. Both wound areas appear healthy. There was no evidence of infection. The the new area on the right medial ankle has enlarged although that both the surfaces appear to be stable. 12/07/14; Debridement of the right medial ankle wound. No no debridement was done on the left. 12/14/14 no major change in and now bilateral medial ankle wounds. Both of these are very painful but the no overt evidence of infection. She has had previous venous ablation 12/21/14; patient states that her right medial ankle wound is considerably more painful last week than usual. Her left is also somewhat painful. She could not tolerate debridement. The right medial ankle wound has fibrinous surface eschar 12/28/14 this is a patient with severe bilateral venous insufficiency ulcers. For a considerable period of time we actually had the one on the right medial ankle healed however this recently opened up again in  June. The left medial ankle wound has been a refractory area with some absent flows. We had some success with Hydrofera Blue on this area and it literally closed by 50% however it is recently opened up Foley. Both of these were debridement today of surface eschar. She tolerates this poorly 01/25/15: No change in the status of this. Thick adherent escar. Very poor tolerance of any attempt at debridement. I had healed the right medial malleolus wound for a considerable amount of time and had the left one down to about 50% of the volume although this is totally regressed over the last 48 weeks. Further the right leg has reopened. she is trying to make a appointment with pain and vascular, previous ablations with Dr. Aleda [name]. I do not believe there is an arterial insufficiency issue here 02/01/15 the status of the adherent eschar bilaterally is actually improved. No debridement was done. She did not manage to get vascular studies done 02/08/15 continued debridement of the area was done today. The slough is less adherent and comes off with less pressure. There is no surrounding infection peripheral pulses are intact 02/15/15 selective debridement with a disposable curette. Again the slough is less adherent and comes off with less difficulty. No surrounding infection peripheral pulses are intact. 02/22/15 selective debridement of the right medial ankle wound. Slough comes off with less difficulty. No obvious surrounding infection peripheral pulses are intact I did not debridement the one on the left. Both of these are stable to improved 03/01/15 selective debridement of both wound areas using a curette to. Adherent slough cup soft with less difficulty. No obvious surrounding infection. The patient tells me that 2 days ago she noted a rash above the right leg wrap. She did not have this on her lower legs when she change this over she arrives with widespread left greater than right almost  folliculitis-looking rash which is extremely pruritic. I don't see anything to culture here. There is no rash on the rest of her body. She feels well systemically. 03/08/15; selective debridement of both wounds using a curette. Base of this does not look unhealthy. She had limegreen drainage coming out of the left leg wound and describes a lot of drainage. The rash on her left leg looks improved to. No cultures were done. 03/22/15; patient was not here last week.  Basal wounds does not look healthy and there is no surrounding erythema. No drainage. There is still a rash on the left leg that almost looks vasculitic however it is clearly limited to the top of where the wrap would be. 04/05/15; on the right required a surgical debridement of surface eschar and necrotic subcutaneous tissue. I did not debridement the area on the left. These continue to be large open wounds that are not changing that much. We were successful at one point in healing the area on the right, and at the same time the area on the left was roughly half the size of current measurements. I think a lot of the deterioration has to do with the prolonged time the patient is on her feet at work 04/19/15 I attempted-like surface debridement bilaterally she does not tolerate this. She tells me that she was in allergic care yesterday with extreme pain over her left lateral malleolus/ankle and was told that she has an "sprain" 05/03/15; large bilateral venous insufficiency wounds over the medial malleolus/medial aspect of her ankles. She complains of copious amounts of drainage and his usual large amounts of pain. There is some increasing erythema around the wound on the right extending into the medial aspect of her foot to. historically she came in with these wounds the right one healed and the left one came down to roughly half its current size however the right one is reopened and the left is expanded. 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 05/10/15 large bilateral venous insufficiency wounds. There is less adherence surface left however the surface culture that I did last week grew pseudomonas therefore bilateral selective debridement score necessary. There is surrounding erythema. The patient describes severe bilateral drainage and a lot of pain in the left ankle. Apparently her podiatrist was were ready to do a cortisone shot 05/17/15; the patient complains of pain and again copious amounts of drainage. 05/24/15; we used Iodo flex last week. Patient notes considerable improvement in wound drainage. Only needed to change this once. 05/31/15; we continued Iodoflex; the base of these large wounds bilaterally is not too bad but there is probably likely a significant bioburden here. I would like to debridement just doesn't tolerate it. 06/06/14 I would like to continue the Iodoflex although she still hasn't managed to obtain supplies. She has bilateral medial malleoli or large wounds which are mostly superficial. Both of them are covered circumferentially with some nonviable fibrinous slough although she tolerates debridement very poorly. She apparently has an appointment for an ablation on the right leg by interventional radiology. 06/14/15; the patient arrives with the wounds and static condition. We attempted a debridement although she does not do well with this secondary to pain. I 07/05/15; wounds are not much smaller however there appears to be a cleaner granulating base. The left has tight fibrinous slough greater than the right. Debridement is tolerated poorly due to pain. Iodoflex is done more for these wounds in any of the multitude of different dressings I have tried on the left 1 and then subsequently the right. 07/12/15; no change in the condition of this wound. I am able to do an aggressive debridement on the right but not the left. She simply cannot tolerate it. We have  been using Iodoflex which helps somewhat. It is worthwhile remembering that at one point we healed the right medial ankle wound and the left was about 25%  of the current circumference. We have suggested returning to vascular surgery for review of possible further ablations for one reason or another she has not been able to do this. 07/26/15 no major change in the condition of either wound on her medial ankle. I did not attempt to debridement of these. She has been aggressively scrubbing these while she is in the shower at home. She has her supply of Iodoflex which seems to have done more for these wounds then anything I have put on recently. 08/09/15 wound area appears larger although not verified by measurements. Using Iodoflex 09/05/2015 -- she was here for avisit today but had significant problems with the wound and I was asked to see her for a physician opinion. I have summarize that this lady has had surgery on her left lower extremity about 10 years ago where the possible veins stripping was done. She has had an opinion from interventional radiology around November 2016 where no further sclerotherapy was ordered. The patient works 12 hours a day and stands on a concrete floor with work boots and is unable to get the proper compression she requires and cannot elevate her limbs appropriately at any given time. She has recently grown Pseudomonas from her wound culture but has not started her ciprofloxacin which was called in for her. 09/13/15 this continues to be a difficult situation for this patient. At one point I had this wound down to a 1.5 x 1.5" wound on her left leg. This is deteriorated and the right leg has reopened. She now has substantial wounds on her medial calcaneus, malleoli and into her lower leg. One on the left has surface eschar but these are far too painful for me to debridement here. She has a vascular surgery appointment next week to see if anything can be done to help here.  I think she has had previous ablations several years ago at [facility]. She has no major edema. She tells me that she did not get product last time [facility]] and went for several days without it. She continues to work in work boots 12 hours a day. She cannot get compression/4-layer under her work boots. 09/20/15 no major change. Periwound edema control was not very good. Her point with pain and vascular is next Wednesday the 25th 09/28/15; the patient is seen vascular surgery and is apparently scheduled for repeat duplex ultrasounds of her bilateral lower legs next week. 10/05/15; the patient was seen by Dr. Doren Custard of vascular surgery. He feels that she should have arterial insufficiency excluded as cause/contributed to her nonhealing stage she is therefore booked for an arteriogram. She has apparently monophasic signals in the dorsalis pedis pulses. She also of course has known severe chronic venous insufficiency with previous procedures as noted previously. I had another long discussion with the patient today about her continuing to work 12 hour shifts. I've written her out for 2 months area had concerns about this as her work location is currently undergoing significant turmoil and this may lead to her termination. She is aware of this however I agree with her that she simply cannot continue to stand for 12 hours multiple days a week with the substantial wound areas she has. 10/19/15; the Dr. Doren Custard appointment was largely for an arteriogram which was normal. She does not have an arterial issue. He didn't make a comment about her chronic venous insufficiency for which she has had previous ablations. Presumably it was not felt that anything additional could be done. The  patient is now out of work as I prescribed 2 weeks ago. Her wounds look somewhat less aggravated presumably because of this. I felt I would give debridement another try today 10/25/15; no major change in this patient's wounds. We  are struggling to get her product that she can afford into her own home through her insurance. 11/01/15; no major change in the patient's wounds. I have been using silver alginate as the most affordable product. I spoke to Dr. Marla Roe last week with her requested take her to the OR for surgical debridement and placement of ACEL. Dr. Marla Roe told me that she would be willing to do this however [facility] will not cover this, fortunately the patient has [place] healthcare of some variant 11/08/15; no major change in the patient's wounds. She has been completely nonviable surface that this but is in too much pain with any attempted debridement are clinic. I have arranged for her to see Dr. Marla Roe ham of plastic surgery and this appointment is on Monday. I am hopeful that they will take her to the OR for debridement, possible ACEL ultimately possible skin graft 11/22/15 no major change in the patient's wounds over her bilateral medial calcaneus medial malleolus into the lower legs. Surface on these does not look too bad however on the left there is surrounding erythema and tenderness. This may be cellulitis or could him sleepy tinea. 11/29/15; no major changes in the patient's wounds over her bilateral medial malleolus. There is no infection here and I don't think any additional antibiotics are necessary. There is now plan to move forward. She sees Dr. Marla Roe in a week's time for preparation for operative debridement and ACEL placement I believe on 7/12. She then has a follow-up appointment with Dr. Marla Roe on 7/21 12/28/15; the patient returns today having been taken to the [facility] by Dr. Marla Roe 12/12/15 she underwent debridement, intraoperative cultures [which were negative]. She had placement of a wound VAC. Parent really ACEL was not available to be placed. The wound VAC foam apparently adhered to the wound since then she's been using silver alginate, Xeroform under Ace wraps.  She still says there is a lot of drainage and a lot of pain 01/31/16; this is a patient I see monthly. I had referred her to Dr. Marla Roe him of plastic surgery for large wounds on her bilateral medial ankles. She has been to the OR twice once in early July and once in early August. She tells me over the last 3 weeks she has been using the wound VAC with ACEL underneath it. On the right we've simply been using silver alginate. Under Kerlix Coban wraps. 02/28/16; this is a patient I'm currently seeing monthly. She is gone on to have a skin graft over her large venous insufficiency ulcer on the left medial ankle. This was done by Dr. Marla Roe him. The patient is a bit perturbed about why she didn't have one on her right medial ankle wound. She has been using silver alginate to this. 03/06/16; I received a phone call from her plastic surgery Dr. Marla Roe. She expressed some concern about the viability of the skin graft she did on the left medial ankle wound. Asked me to place Endoform on this. She told me she is not planning to do a subsequent skin graft on the right as the left one did not take very well. I had placed Hydrofera Blue on the right 03/13/16; continue to have a reasonably healthy wound on the right medial  ankle. Down to 3 mm in terms of size. There is epithelialization here. The area on the left medial ankle is her skin graft site. I suppose the last week this looks somewhat better. She has an open area inferiorly however in the center there appears to be some viable tissue. There is a lot of surface callus and eschar that will eventually need to come off however none of this looked to be infected. Patient states that the is able to keep the dressing on for several days which is an improvement. 03/20/16 no major change in the circumference of either wound however on the left side the patient was at Dr. Eusebio Friendly office and they did a debridement of left wound. 50% of the wound  seems to be epithelialized. I been using Endoform on the left Hydrofera Blue in the right 03/27/16; she arrives today with her wound is not looking as healthy as they did last week. The area on the right clearly has an adherent surface to this a very similar surface on the left. Unfortunately for this patient this is all too familiar problem. Clearly the Endoform is not working and will need to change that today that has some potential to help this surface. She does not tolerate debridement in this clinic very well. She is changing the dressing wants 04/03/16; patient arrives with the wounds looking somewhat better especially on the right. Dr. Migdalia Dk change the dressing to silver alginate when she saw her on Monday and also sold her some compression socks. The usefulness of the latter is really not clear and woman with severely draining wounds. 04/10/16; the patient is doing a bit of an experiment wearing the compression stockings that Dr. Migdalia Dk provided her to her left leg and the out of legs based dressings that we provided to the right. 05/01/16; the patient is continuing to wear compression stockings Dr. Migdalia Dk provided her on the left that are apparently silver impregnated. She has been using Iodoflex to the right leg wound. Still a moderate amount of drainage, when she leaves here the wraps only last for 4 days. She has to change the stocking on the left leg every night 05/15/16; she is now using compression stockings bilaterally provided by Dr. Marla Roe. She is wearing a nonadherent layer over the wounds so really I don't think there is anything specific being done to this now. She has some reduction on the left wound. The right is stable. I think all healing here is being done without a specific dressing 06/09/16; patient arrives here today with not much change in the wound certainly in diameter to large circular wounds over the medial aspect of her ankle bilaterally. Under the light of  these services are certainly not viable for healing. There is no evidence of surrounding infection. She is wearing compression stockings with some sort of silver impregnation as prescribed by Dr. Marla Roe. She has a follow-up with her tomorrow. 06/30/16; no major change in the size or condition of her wounds. These are still probably covered with a nonviable surface. She is using only her purchase stockings. She did see Dr. Marla Roe who seemed to want to apply Dakin's solution to this I'm not extreme short what value this would be. I would suggest Iodoflex which she still has at home. 07/28/16; I follow Mrs. Gibeault episodically along with Dr. Marla Roe. 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 She has been applying a topical collagen ointment to both wounds  with Adaptic. I don't think Dr. Marla Roe is planning to take her back to the OR. 08/19/16; I follow Mrs. Jeneen Rinks on a monthly basis along with Dr. Marla Roe of plastic surgery. 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 I been following her for a number of years. At one point I was able to get the right medial malleolus wound to heal and had the left medial malleolus down to about half its current size however and I had to send her to plastic surgery for an operative debridement. Since then things have been stable to slightly improve the area on the right is slightly better one in the left about the same although there is much less adherent surface than I'm used to with this patient. She is using some form of liquid collagen gel that Dr. Marla Roe provided a Kerlix cover with the patient's own pressure stockings. She tells me that she has extreme pain in both ankles and along the lateral aspect of both feet. She has been unable to work for some period of time. She is telling me she is retiring at the beginning  of April. She sees Dr. Doran Durand of orthopedics next week 09/22/16; patient has not seen Dr. Marla Roe since the last time she is here. I'm not really sure what she is using to the wounds other than bits and pieces of think she had left over including most recently Hydrofera Blue. She is using juxtalite stockings. She is having difficulty with her husband's recent illness "stroke". She is having to transport him to various doctors appointments. Dr. Marla Roe left her the option of a repeat debridement with ACEL however she has not been able to get the time to follow-up on this. She continues to have a fair amount of drainage out of these wounds with certainly precludes leaving dressings on all week 10/13/16; patient has not seen Dr. Marla Roe since she was last in our clinic. I'm not really sure what she is doing with the wounds, we did try to get her [facility] and I think she is actually using this most of the time. Because of drainage she states she has to change this every second day although this is an improvement from what she used to do. She went to see Dr. Doran Durand who did not think she had a muscular issue with regards to her feet, he referred her to a neurologist and I think the appointment is sometime in June. I changed her back to Iodoflex which she has used in the past but not recently. 11/03/16; the patient has been using Iodoflex although she ran out of this. Still claims that there is a lot of drainage although the wound does not look like this. No surrounding erythema. She has not been back to see Dr. Marla Roe 11/24/16; the patient has been using Iodoflex again but she ran out of it 2 or 3 days ago. There is no major change in the condition of either one of these wounds in fact they are larger and covered in a thick adherent surface slough/nonviable tissue especially on the left. She does not tolerate mechanical debridement in our clinic. Going back to see Dr. Marla Roe of plastic  surgery for an operative debridement would seem reasonable. 12/15/16; the patient has not been back to see Dr. Marla Roe. She is been dealing with a series of illnesses and her husband which of monopolized her time. She is been using Sorbact which we largely supplied.  She states the drainage is bad enough that it maximum she can go 2-3 days without changing the dressing 01/12/2017 -- the patient has not been back for about 4 weeks and has not seen Dr. Marla Roe not does she have any appointment pending. 01/23/17; patient has not seen Dr. Marla Roe even though I suggested this previously. She is using Santyl that was suggested last week by Dr. Con Memos this Cost her $16 through her insurance which is indeed surprising 02/12/17; continuing Santyl and the patient is changing this daily. A lot of drainage. She has not been back to see plastic surgery she is using an Ace wrap. Our intake nurse suggested wrap around stockings which would make a good reasonable alternative 02/26/17; patient is been using Santyl and changing this daily due to drainage. She has not been to see plastic surgery she uses in April Ace wrap to control the edema. She did obtain extremitease stockings but stated that the edema in her leg was to big for these 03/20/17; patient is using Santyl and Anasept. Surfaces looked better today the area on the right is actually measuring a little smaller. She has states she has a lot of pain in her feet and ankles and is asking for a consult to pain control which I'll try to help her with through our case manager. 04/10/17; the patient arrives with better-looking wound surfaces and is slightly smaller wound on the left she is using a combination of Santyl and Anasept. She has an appointment or at least as started in the pain control center associated with [facility] 05/14/17; this is a patient who I followed for a prolonged period of time. She has venous insufficiency ulcers on  her bilateral medial ankles. At one point I had this down to a much smaller wound on the left however these reopened and we've never been able to get these to heal. She has been using Santyl and Anasept gel although 2 weeks ago she ran out of the Anasept gel. She has a stable appearance of the wound. She is going to the wound care clinic at [facility] They wanted do a nerve block/spinal block although she tells me she is reluctant to go forward with that. 05/21/17; this is a patient I have followed for many years. 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 Chronic pain and deformity in her ankles as well. She is been to see plastic surgery as well as orthopedics. Using PolyMem AG most recently/Kerramax/ABDs and 2 layer compression. She has managed to keep this on and she is coming in for a nurse check to change the dressing on Tuesdays, we see her on Fridays 06/05/17; really quite a good looking surface and the area especially on the right medial has contracted in terms of dimensions. Well granulated healthy-looking tissue on both sides. Even with an open curet there is nothing that even feels abnormal here. This is as good as I've seen this in quite some time. We have been using PolyMem AG and bringing her in for a nurse check 06/12/17; really quite good surface on both of these wounds. The right medial has contracted a bit left is not. We've been using PolyMem and AG and she is coming in for a nurse visit 06/19/17; we have been using PolyMem AG and bringing her in for a nurse check. Dimensions of her wounds are not better but the surfaces looked better bilaterally. She complained of bleeding last night and the left wound and increasing  pain bilaterally. She states her wound pain is more neuropathic than just the wounds. There was some suggestion that this was radicular from her pain management doctor in talking to her it is really difficult to sort  this out. 06/26/17; using PolyMem and AG and bringing her in for a nurse check as All of this and reasonably stable condition. Certainly not improved. The dimensions on the lateral part of the right leg look better but not really measuring better. The medial aspect on the left is about the same. 07/03/16; we have been using PolyMen AG and bringing her in for a nurse check to change the dressings as the wounds have drainage which precludes once weekly changing. We are using all secondary absorptive dressings.our intake nurse is brought up the idea of using a wound VAC/snap VAC on the wound to help with the drainage to see if this would result in some contraction. This is not a bad idea. The area on the right medial is actually looking smaller. Both wounds have a reasonable-looking surface. There is no evidence of cellulitis. The edema is well controlled 07/10/17; the patient was denied for a snap VAC by her insurance. The major issue with these wounds continues to be drainage. We are using wicked PolyMem AG and she is coming in for a nurse visit to change this. The wounds are stable to slightly improved. The surface looks vibrant and the area on the right certainly has shrunk in size but very slowly 07/17/17; the patient still has large wounds on her bilateral medial malleoli. Surface of both of these wounds looks better. The dimensions seem to come and go but no consistent improvement. There is no epithelialization. We do not have options for advanced treatment products due to insurance issues. They did not approve of the wound VAC to help control the drainage. More recently we've been using PolyMem and AG wicked to allow drainage through. We have been bringing her in for a nurse visit to change this. We do not have a lot of options for wound care products and the home again due to insurance issues 07/24/17; the patient's wound actually looks somewhat better today. No drainage measurements are smaller  still healthy-looking surface. We used silver collagen under PolyMen started last week. We have been bringing her in for a dressing change 07/31/17; patient's wound surface continued to look better and I think there is visible change in the dimensions of the wound on the right. Rims of epithelialization. We have been using silver collagen under PolyMen and bringing her in for a dressing change. There appears to be less drainage although she is still in need of the dressing change 08/07/17. Patient's wound surface continues to look better on both sides and the area on the right is definitely smaller. We have been using silver collagen and PolyMen. She feels that the drainage has been it has been better. I asked her about her vascular status. She went to see Dr. Aleda [name] at [facility] and had some form of ablation. I don't have much detail on this. I haven't my notes from 2016 that she was not a candidate for any further ablation but I don't have any more information on this. We had referred her to vein and vascular I don't think she ever went. He does not have a history of PAD although I don't have any information on this either. We don't even have ABIs in our record 08/14/17; we've been using silver collagen and PolyMen cover. And  putting the patient and compression. She we are bringing her in as a nurse visit to change this because ofarge amount of drainage. We didn't the ABIs in clinic today since they had been done in many moons 1.2 bilaterally. She has been to see vein and vascular however this was at [facility] and she had ablation although I really don't have any information on this all seemed biking get a report. She is also been operatively debrided by plastic surgery and had a cell placed probably 8-12 months ago. This didn't have a major effect. We've been making some gains with current dressings 08/19/17-She is here in follow-up evaluation for bilateral medial malleoli ulcers. She  continues to tolerate debridement very poorly. We will continue with recently changed topical treatment; if no significant improvement may consider switching to Iodosorb/Iodoflex. 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 She has been using silver collagen and PolyMem. I believe she has been used and tried on Iodoflex before. 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 These have been very refractory over time. We have been using silver collagen and PolyMen. She is coming in once a week for a doctor's and once a week for nurse visits. We are actually making some progress 09/18/17; the patient's wounds are smaller especially on the right medial. She arrives today to upset to consider even washing these off with Anasept which I think is been part of the reason this is been closing. We've been using collagen covered in PolyMen otherwise. It is noted that she has a small area of folliculitis on the right medial calf that. As we are wrapping her legs I'll give her a short course of doxycycline to make sure this doesn't amount to anything. She is a long list of complaints today including imbalance, shortness of breath on exertion, inversion of her left ankle. With regards to the latter complaints she is been to see orthopedics and they offered her a tendon release surgery I believe but wanted her wounds to be closed first. I have recommended she go see her primary physician with regards to everything else. 09/25/17; patient's wounds are about the same size. We have made some progress bilaterally although not in recent weeks. She will not allow me T wash these wounds with Anasept even if she is doing her cell. Wheeze we've been using collagen covered in PolyMen. Last  week she had a small area of folliculitis this is now opened into a small wound. She completed 5 days of trimethoprim sulfamethoxazole 10/02/17; unfortunately the area on her left medial ankle is worse with a larger wound area towards the Achilles. The patient complains of a lot of pain. She will not allow debridement although visually I don't think there is anything to debridement in any case. We have been using silver collagen and PolyMen for several months now. Initially we are making some progress although I'm not really seeing that today. We will move back to [facility]. His admittedly this is a bit of a repeat however I'm hoping that his situation is different now. The patient tells me she had her leg on the left give out on her yesterday this is process some pain. 10/09/17; the patient is seen twice a week largely because of drainage issues coming out of  the chronic medial bimalleolar wounds that are chronic. Last week the dimensions of the one on the left looks a little larger I changed her to [facility]. She comes in today with a history of terrible pain in the bilateral wound areas. She will not allow debridement. She will not even allow a tissue culture. There is no surrounding erythema no no evidence of cellulitis. We have been putting her Kerlix Coban man. She will not allow more aggressive compression as there was a suggestion to put her in 3 layer wraps. 10/16/17; large wounds on her bilateral medial malleoli. These are chronic. Not much change from last week. The surface looks have healthy but absolutely no epithelialization. A lot of pain little less so of drainage. She will not allow debridement or even washing these off in the vigorous fashion with Anasept. 10/23/17; large wounds on her bilateral malleoli which are chronic. Some improvement in terms of size perhaps on the right since last time I saw these. She states that after we increased the 3 layer compression there was  some bleeding, when she came in for a nurse visit she did not want 3 layer compression put back on about our nurse managed to convince her. She has known chronic venous visit issues and I'm hoping to get her to tolerate the 3 layer compression. using Hydrofera Blue 10/30/17; absolutely no change in the condition of either wound although we've had some improvement in dimensions on the right.. Attempted to put her in 3 layer compression she didn't tolerated she is back in 2 layer compression. We've been using Hydrofera Blue We looked over her past records. She had venous reflux studies in November 2016. There was no evidence of deep venous reflux on the right. Superficial vein did not show the greater saphenous vein at think this is been previously ablated the small saphenous vein was within normal limits. The left deep venous system showed no DVT the vessels were positive for deep venous reflux in the posterior tibial veins at the ankle. The greater saphenous vein was surgically absent small saphenous vein was within normal limits. She went to vein and vascular at [facility]. I believe she had an ablation on the left greater saphenous vein. I'll update her reflux studies perhaps ever reviewed by vein and vascular. We've made absolutely no progress in these wounds. Will also try to read and TheraSkins through her insurance 11/06/17; W the patient apparently has a 2 week follow-up with vein and vascular I like him to review the whole issue with regards to her previous vascular workup by Dr. Aleda [name]. We've really made no progress on these wounds in many months. She arrives today with less viable looking surface on the left medial ankle wound. This was apparently looking about the same on Tuesday when she was here for nurse visit. 11/13/17; deep tissue culture I did last time of the left lower leg showed multiple organisms without any predominating. In particular no Staphylococcus or group A strep  were isolated. We sent her for venous reflux studies. She's had a previous left greater saphenous vein stripping and I think sclerotherapy of the right greater saphenous vein. She didn't really look at the lesser saphenous vein this both wounds are on the medial aspect. She has reflux in the common femoral vein and popliteal vein and an accessory vein on the right and the common femoral vein and popliteal vein on the left. I'm going to have her go to see vein and vascular just  the look over things and see if anything else beside aggressive compression is indicated here. We have not been able to make any progress on these wounds in spite of the fact that the surface of the wounds is never look too bad. 11/20/17; no major change in the condition of the wounds. Patient reports a large amount of drainage. She has a lot of complaints of pain although enlisting her today I wonder if some of this at least his neuropathic rather than secondary to her wounds. She has an appointment with vein and vascular on 12/30/17. The refractory nature of these wounds in my mind at least need vein and vascular to look over the wounds the recent reflux studies we did and her history to see if anything further can be done here. I also note her gait is deteriorated quite a bit. 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 She has a bilateral Trendelenburg gait. I wonder if this is neuropathic or perhaps multilevel radicular. 11/27/17; her wounds actually looks slightly better. Healthy-looking granulation tissue a scant amount of epithelialization. [place] [facility] will not pay for [employer]. They will play for tri layer Oasis and Dermagraft. This is not a diabetic ulcer. We'll try for the tri layer Oasis. She still complains of some drainage. She has a vein and vascular appointment on 12/30/17 12/04/17; the wounds visually look quite good. Healthy-looking granulation with some degree of epithelialization. We are still  waiting for response to our request for trial to try layer Oasis. Her appointment with vascular to review venous and arterial issues isn't sold the end of July 7/31. Not allow debridement or even vigorous cleansing of the wound surface. 12/18/17; slightly smaller especially on the right. 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 We've been using Hydrofera Blue. We still are looking into triple layer Oasis through her insurance 01/08/18 on evaluation today patient's wound actually appears to be showing signs of good improvement at this point in time. She has been tolerating the dressing changes without complication. Fortunately there does not appear to be any evidence of infection at this point in time. We have been utilizing silver nitrate which does seem to be of benefit for her which is also good news. Overall I'm very happy with how things seem to be both regards appearance as well as measurement. Patient did see Dr. Bridgett Larsson for evaluation on 12/30/17. In his assessment he felt that stripping would not likely add much more than chronic compression to the patient's healing process. His recommendation was to follow-up in three months with Dr. Doren Custard if she hasn't healed in order to consider referral back to you and see vascular where she previously was in a trial and was able to get her wound to heal. I'll be see what she feels she when you staying compression and he reiterated this as well. 01/13/18 on evaluation today patient appears to actually be doing very well in regard to her bilateral medial malleolus ulcers. She seems to have tolerated the chemical cauterization with silver nitrate last week she did have some pain through that evening but fortunately states that I'll be see since it seems to be doing better she is overall pleased with the progress. 01/21/18; really quite a remarkable improvement since I've last seen these wounds. We started using silver  nitrate specially on the islands of hyper granulation which for some reason her around the wound circumference. This is really done quite nicely. Primary dressing Hydrofera Blue under 4  layer compression. She seems to be able to hold out without a nurse rewrap. Follow-up in 1 week 01/28/18; we've continued the hydrofera blue but continued with chemical cauterization to the wound area that we started about a month ago for irregular hyper granulation. She is made almost stunning improvement in the overall wound dimensions. I was not really expecting this degree of improvement in these chronic wounds 02/05/18; we continue with Hydrofera Bluebut of also continued the aggressive chemical cauterization with silver nitrate. We made nice progress with the right greater than left wound. 02/12/18. We continued with Hydrofera Blue after aggressive chemical cauterization with silver nitrate. We appear to be making nice progress with both wound areas 02/19/2018; we continue with [facility] after washing the wounds vigorously with Anasept spray and chemical cauterization with silver nitrate. We are making excellent progress. The area on the right's just about closed 02/26/2018. The area on the left medial ankle had too much necrotic debris today. I used a #5 curette we are able to get most of the soft. I continued with the silver nitrate to the much smaller wound on the right medial ankle she had a new area on her right lower pretibial area which she says was due to a role in her compression 03/05/2018; both wound areas look healthy. Not much change in dimensions from last week. I continue to use silver nitrate and Hydrofera Blue. The patient saw Dr. Doren Custard of vein and vascular. He felt she had venous stasis ulcers. He felt based on her previous arteriogram she should have adequate circulation for healing. Also she has deep venous reflux but really no significant correctable superficial venous reflux at this time.  He felt we should continue with conservative management including leg elevation and compression 04/02/2018; since we last saw this woman about a month ago she had a fall apparently suffered a pelvic fracture. I did not look up the x-ray. Nevertheless because of pain she literally was bedbound for 2 weeks and had home health coming out to change the dressing. Somewhat predictably this is resulted in considerable improvement in both wound areas. The right is just about closed on the medial malleolus and the left is about half the size. 04/16/2018; both her wounds continue to go down in size. Using Hydrofera Blue. 05/07/18; both her wounds appeared to be improving especially on the right where it is almost closed. We are using Hydrofera Blue 05/14/2018; slightly worse this week with larger wounds. Surface on the left medial not quite as good. We have been using Hydrofera Blue 05/21/18; again the wounds are slightly larger. Left medial malleolus slightly larger with eschar around the circumference. We have been using Hydrofera Blue undergoing a wraps for a prolonged period of time. This got a lot better when she was more recumbent due to a fall and a back injury. I change the primary dressing the silver alginate today. She did not tolerate a 4 layer compression previously although I may need to bring this up with her next time 05/28/2018; area on the left medial malleolus again is slightly larger with more drainage. Area on the right is roughly unchanged. She has a small area of folliculitis on the right medial just on the lower calf. This does not look ominous. 06/03/2018 left medial malleolus slightly smaller in a better looking surface. We used silver nitrate on this last time with silver alginate. The area on the right appears slightly smaller 1/10; left medial malleolus slightly smaller. Small open area on  the right. We used silver nitrate and silver alginate as of 2 weeks ago. We continue with  the wound and compression. These got a lot better when she was off her feet 1/17; right medial malleolus wound is smaller. The left may be slightly smaller. Both surfaces look somewhat better. 1/24; both wounds are slightly smaller. Using silver alginate under Unna boots 1/31; both wounds appear smaller in fact the area on the right medial is just about closed. Surface eschar. We have been using silver alginate under Unna boots. The patient is less active now spends let much less time on her feet and I think this is contributed to the general improvement in the wound condition 2/7; both wounds appear smaller. I was hopeful the right medial would be closed however there there is still the same small open area. Slight amount of surface eschar on the left the dimensions are smaller there is eschar but the wound edges appear to be free. We have been using silver alginate under Unna boot's 2/14; both wounds once again measure smaller. Circumferential eschar on the left medial. We have been using silver alginate under Unna boots with gradual improvement 2/21; the area on the right medial malleolus has healed. The area on the left is smaller. We have been using silver alginate and Unna boots. We can discharge wrapping the right leg she has 20/30 stockings at home she will need to protect the scar tissue in this area 2/28; the area on the right medial malleolus remains closed the patient has a compression stocking. The area on the left is smaller. We have been using silver alginate and Unna boots. 3/6 the area on the right medial ankle remains closed. Good edema control noted she is using her own compression stocking. The area on the left medial ankle is smaller. We have been managing this with silver alginate and Unna boots which we will continue today. 3/13; the area on the right medial ankle remains closed and I'm declaring it healed today. When necessary the left is about the same still a  healthy-looking surface but no major change and wound area. No evidence of infection and using silver alginate under unna and generally making considerable improvement 3/27 the area on the right medial ankle remains closed the area on the left is about the same as last week. Certainly not any worse we have been using silver alginate under an Unna boot 4/3; the area on the right medial ankle remains closed per the patient. We did not look at this wound. The wound on the left medial ankle is about the same surface looks healthy we have been using silver alginate under an Unna boot 4/10; area on the right medial ankle remains closed per the patient. We did not look at this wound. The wound on the left medial ankle is slightly larger. The patient complains that the [facility] caused burning pain all week. She also told us that she was a lot more active this week. Changed her back to silver alginate 4/17; right medial ankle still closed per the patient. Left medial ankle is slightly larger. Using silver alginate. She did not tolerate Hydrofera Blue on this area 4/24; right medial ankle remains closed we have not look at this. The left medial ankle continues to get larger today by about a centimeter. We have been using silver alginate under Unna boots. She complains about 4 layer compression as an alternative. She has been up on her feet working on her garden  5/8; right medial ankle remains closed we did not look at this. The left medial ankle has increased in size about 100%. We have been using silver alginate under Unna boots. She noted increased pain this week and was not surprised that the wound is deteriorated 5/15; no major change in SA however much less erythema ( one week of doxy ocellulitis). 5/22-64 year old female returns at 1 week to the clinic for left medial ankle wound for which we have been using silver alginate under 3 layer compression She was placed on DOXY at last visit - the  wound is wider at this visit. She is in 3 layer compression 5/29; change to [facility] last week. I had given her empiric doxycycline 2 weeks ago for a week. She is in 3 layer compression. She complains of a lot of pain and drainage on presentation today. 6/5; using Hydrofera Blue. I gave her doxycycline recently empirically for erythema and pain around the wound. Believe her cultures showed enterococcus which not would not have been well covered by doxycycline nevertheless the wound looks better and I don't feel specifically that the enterococcus needs to be covered. She has a new what looks like a wrap injury on her lateral left ankle. 6/12; she is using Hydrofera Blue. She has a new area on the left anterior lower tibial area. This was a wrap injury last week. 6/19; the patient is using Hydrofera Blue. She arrived with marked inflammation and erythema around the wound and tenderness. 12/01/18 on evaluation today patient appears to be doing a little bit better based on what I'm hearing from the standpoint of lassos evaluation to this as far as the overall appearance of the wound is concerned. Then sometime substandard she typically sees Dr. Dellia Nims. Nonetheless overall very pleased with the progress that she's made up to this point. No fevers, chills, nausea, or vomiting noted at this time. 7/10; some improvement in the surface area. Aggressively debrided last week apparently. I went ahead with the debridement today although the patient does not tolerate this very well. We have been using Iodoflex. Still a fair amount of drainage 7/17; slightly smaller. Using Iodoflex. 7/24; no change from last week in terms of surface area. We have been using Iodoflex. Surface looks and continues to look somewhat better 7/31; surface area slightly smaller better looking surface. We have been using Iodoflex. This is under Unna boot compression 8/7-Patient presents at 1 week with Unna boot and Iodoflex,  wound appears better 8/14-Patient presents at 1 week with Iodoflex, we use the Unna boot, wound appears to be stable better.Patient is getting Botox treatment for the inversion of the foot for tendon release, Next week 8/21; we are using Iodoflex. Unna boot. The wound is stable in terms of surface area. Under illumination there is some areas of the wound that appear to be either epithelialized or perhaps this is adherent slough at this point I was not really clear. It did not wipe off and I was reluctant to debride this today. 8/28; we are using Iodoflex in an Unna boot. Seems to be making good improvement. 9/4; using Iodoflex and wound is slightly smaller. 9/18; we are using Iodoflex with topical silver nitrate when she is here. The wound continues to be smaller 10/2; patient missed her appointment last week due to GI issues. She left and Iodoflex based dressing on for 2 weeks. Wound is about the same size about the size of a dime on the left medial lower 10/9 we have  been using Iodoflex on the medial left ankle wound. She has a new superficial probable wrap injury on the dorsal left ankle 10/16; we have been using Hydrofera Blue since last week. This is on the left medial ankle 10/23; we have been using Hydrofera Blue since 2 weeks ago. This is on the left medial ankle. Dimensions are better 11/6; using Hydrofera Blue. I think the wound is smaller but still not closed. Left medial ankle 11/13; we have been using Hydrofera Blue. Wound is certainly no smaller this week. Also the surface not as good. This is the remanent of a very large area on her left medial ankle. 11/20; using Sorbact since last week. Wound was about the same in terms of size although I was disappointed about the surface debris 12/11; 3-week follow-up. Patient was on vacation. Wound is measuring slightly larger we have been using Sorbact. 12/18; wound is about the same size however surface looks better last week after  debridement. We have been using Sorbact under compression 1/15 wound is probably twice the size of last time increased in length nonviable surface. We have been using Sorbact. She was running a mild fever and missed her appointment last week 1/22; the wound is come down in size but under illumination still a very adherent debris we have been Hydrofera Blue that I changed her to last week 1/29; dimensions down slightly. We have been using Hydrofera Blue 2/19 dimensions are the same however there is rims of epithelialization under illumination. Therefore more the surface area may be epithelialized 2/26; the patient's wound actually measures smaller. The wound looks healthy. We have been using Hydrofera Blue. I had some thoughts about running Apligraf then I still may do that however this looks so much better this week we will delay that for now 3/5; the wound is small but about the same as last week. We have been using Hydrofera Blue. No debridement is required today. Objective Constitutional Vitals Time Taken: 10:50 AM, Height: 68 in, Weight: 127 lbs, BMI: 19.3, Temperature: 98.4 F, Pulse: 61 bpm, Respiratory Rate: 18 breaths/min, Blood Pressure: 117/50 mmHg. Cardiovascular Pedal pulses are palpable. No uncontrolled edema. General Notes: Wound exam; left medial malleolus under illumination. No debridement was required today the wound is small and appears to be epithelializing. Even though there was no direct change in surface area measurements I decided to give this another week of Hydrofera Blue. She tells me she has had an active week, use of this does better when she is not that active. Integumentary (Hair, Skin) No erythema around the wound. Wound #3 status is Open. Original cause of wound was Gradually Appeared. The wound is located on the Left,Medial Malleolus. The wound measures 1cm length x 0.9cm width x 0.1cm depth; 0.707cm^2 area and 0.071cm^3 volume. There is Fat Layer  (Subcutaneous Tissue) Exposed exposed. There is no tunneling or undermining noted. There is a small amount of serosanguineous drainage noted. The wound margin is flat and intact. There is large (67-100%) red, pink granulation within the wound bed. There is a small (1-33%) amount of necrotic tissue within the wound bed including Adherent Slough. 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 There was a Haematologist Compression Therapy Procedure by Deon Pilling, RN. Post procedure Diagnosis Wound #3: Same as Pre-Procedure  Plan Follow-up Appointments: Return Appointment in 1 week. - Friday Dressing Change Frequency: Wound #3 Left,Medial Malleolus: Do not change entire dressing for one week. - change dressing in one week Skin Barriers/Peri-Wound Care: Moisturizing lotion TCA Cream or Ointment - mixed with lotion Wound Cleansing: Clean wound with Wound Cleanser - clean with anasept with dressing changes May shower with protection. Primary Wound Dressing: Wound #3 Left,Medial Malleolus: Hydrofera Blue - classic Secondary Dressing: Wound #3 Left,Medial Malleolus: Dry Gauze Edema Control: Unna Boot to Left Lower Extremity - no kerlix layer Avoid standing for long periods of time Elevate legs to the level of the heart or above for 30 minutes daily and/or when sitting, a frequency of: - throughout the day Support Garment 20-30 mm/Hg pressure to: - compression stocking right leg daily 1. Continue with Hydrofera Blue 2. We have been using an Product manager) Signed: 08/05/2019 5:21:33 PM By: Linton Ham MD Entered By: Linton Ham on 08/05/2019  11:50:15 -------------------------------------------------------------------------------- SuperBill Details Patient Name: Date of Service: [name] 08/05/2019 Medical Record [id number] Patient Account Number: [id number] Date of Birth/Sex: Treating RN: 27-Apr-1956 (64 y.o. [name] Provider: Lennie [name] Other Clinician: Referring Provider: Treating Provider/Extender:Jule Schlabach, [name] in Treatment: 347 Diagnosis Coding ICD-10 Codes Code Description [phone] Non-pressure chronic ulcer of left ankle with fat layer exposed L97.321 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 breakdown of skin Facility Procedures CPT4 Code: 62831517 Description: (Facility Use Only) 29580LT - Dorise Bullion BOOT LT Modifier: Quantity: 1 Physician Procedures CPT4 Code Description: [id number] - WC PHYS LEVEL 2 - EST PT ICD-10 Diagnosis Description R48.546 Non-pressure chronic ulcer of left ankle with fat lay L97.321 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 b Modifier: er exposed reakdown of skin Quantity: 1 Electronic Signature(s) Signed: 08/05/2019 5:21:33 PM By: Linton Ham MD Entered By: Linton Ham on 08/05/2019 11:50:32

## 2019-08-05 NOTE — Progress Notes (Signed)
NAJEE, COWENS ([id number]) Visit Report for 08/05/2019 Fall Risk Assessment Details Patient Name: Date of Service: REMI, RESTER 08/05/2019 10:15 AM Medical Record [id number] Patient Account Number: [id number] Date of Birth/Sex: Treating RN: Jul 13, 1955 (64 y.o. [name] Ameen Mostafa: [name] Other Clinician: Referring Tiahna Cure: Treating Morgen Linebaugh/Extender:Robson, Tawny Asal in Treatment: [phone] Fall Risk Assessment Items Have you had 2 or more falls in the last 12 monthso 0 No Have you had any fall that resulted in injury in the last 12 monthso 0 No FALLS RISK SCREEN History of falling - immediate or within 3 months 25 Yes Secondary diagnosis (Do you have 2 or more medical diagnoseso) 0 No Ambulatory aid None/bed rest/wheelchair/nurse 0 No Crutches/cane/walker 15 Yes Furniture 0 No Intravenous therapy Access/Saline/Heparin Lock 0 No Weak (short steps with or without shuffle, stooped but able to lift head 10 Yes while walking, may seek support from furniture) Impaired (short steps with shuffle, may have difficulty arising from chair, 0 No head down, impaired balance) Mental Status Oriented to own ability 0 Yes Overestimates or forgets limitations 0 No Risk Level: Medium Risk Score: 50 Electronic Signature(s) Signed: 08/05/2019 5:33:53 PM By: Baruch Gouty RN, BSN Entered By: Baruch Gouty on 08/05/2019 11:32:45

## 2019-08-12 ENCOUNTER — Other Ambulatory Visit: Payer: Self-pay

## 2019-08-12 ENCOUNTER — Encounter (HOSPITAL_BASED_OUTPATIENT_CLINIC_OR_DEPARTMENT_OTHER): Payer: Medicare Other | Admitting: Internal Medicine

## 2019-08-12 DIAGNOSIS — E10622 Type 1 diabetes mellitus with other skin ulcer: Secondary | ICD-10-CM | POA: Diagnosis not present

## 2019-08-12 NOTE — Progress Notes (Signed)
[name], PANEBIANCO ([id number]) Visit Report for 08/12/2019 Arrival Information Details Patient Name: Date of Service: LEMPI, [name] 08/12/2019 10:15 AM Medical Record [id number] Patient Account Number: [id number] Date of Birth/Sex: Treating RN: [birth year] (64 y.o. [name] Emberly Tomasso: [name] Other Clinician: Referring Sakura Denis: Treating Christle Nolting/Extender:Robson, Tawny Asal in Treatment: 348 Visit Information History Since Last Visit Cane Added or deleted any medications: No Patient Arrived: 10:18 Any new allergies or adverse reactions: No Arrival Time: Had a fall or experienced change in No Accompanied By: self None activities of daily living that may affect Transfer Assistance: risk of falls: Patient Identification Verified: Yes Signs or symptoms of abuse/neglect since last No Secondary Verification Process Completed: Yes visito Patient Requires Transmission-Based No Hospitalized since last visit: No Precautions: Implantable device outside of the clinic excluding No Patient Has Alerts: No cellular tissue based products placed in the center since last visit: Has Dressing in Place as Prescribed: Yes Has Compression in Place as Prescribed: Yes Pain Present Now: Yes Electronic Signature(s) Signed: 08/12/2019 5:31:10 PM By: Baruch Gouty RN, BSN Entered By: Baruch Gouty on 08/12/2019 10:21:36 -------------------------------------------------------------------------------- Compression Therapy Details Patient Name: Date of Service: [name] 08/12/2019 10:15 AM Medical Record [id number] Patient Account Number: [id number] Date of Birth/Sex: Treating RN: Feb 16, 1956 (64 y.o. [name] Enedina Pair: [name] Other Clinician: Referring Brayln Duque: Treating Madhavi Hamblen/Extender:Robson, Tawny Asal in Treatment: 348 Compression Therapy Performed for Wound Wound #3  Left,Medial Malleolus Assessment: Performed By: Clinician Baruch Gouty, RN Compression Type: Rolena Infante Electronic Signature(s) Signed: 08/12/2019 5:31:10 PM By: Baruch Gouty RN, BSN Entered By: Baruch Gouty on 08/12/2019 10:43:27 -------------------------------------------------------------------------------- Encounter Discharge Information Details Patient Name: Date of Service: [name] 08/12/2019 10:15 AM Medical Record [id number] Patient Account Number: [id number] Date of Birth/Sex: Treating RN: 06-13-1955 (64 y.o. [name] Martyn Timme: [name] Other Clinician: Referring Amad Mau: Treating Kealan Buchan/Extender:Robson, Tawny Asal in Treatment: 348 Encounter Discharge Information Items Discharge Condition: Stable Ambulatory Status: Cane Discharge Destination: Home Transportation: Private Auto Accompanied By: self Schedule Follow-up Appointment: Yes Clinical Summary of Care: Patient Declined Electronic Signature(s) Signed: 08/12/2019 5:31:10 PM By: Baruch Gouty RN, BSN Entered By: Baruch Gouty on 08/12/2019 10:47:32 -------------------------------------------------------------------------------- Patient/Caregiver Education Details Patient Name: Date of Service: [name] 3/12/2021andnbsp10:15 AM Medical Record [id number] Patient Account Number: [id number] Date of Birth/Gender: Treating RN: 1955/10/18 (63 y.o. [name] Physician: [name] Other Clinician: Referring Physician: Treating Physician/Extender:Robson, Tawny Asal in Treatment: 60 Education Assessment Education Provided To: Patient Education Topics Provided Wound/Skin Impairment: Methods: Explain/Verbal Responses: Reinforcements needed, State content correctly Electronic Signature(s) Signed: 08/12/2019 5:31:10 PM By: Baruch Gouty RN, BSN Entered By: Baruch Gouty on 08/12/2019  10:47:05 -------------------------------------------------------------------------------- Wound Assessment Details Patient Name: Date of Service: [name] 08/12/2019 10:15 AM Medical Record [id number] Patient Account Number: [id number] Date of Birth/Sex: Treating RN: [birth year] (64 y.o. [name] Jaylianna Tatlock: [name] Other Clinician: Referring Natallia Stellmach: Treating Elzie Knisley/Extender:Robson, Velva Harman, Essie Christine in Treatment: 348 Wound Status Wound Number: 3 Primary Venous Leg Ulcer Etiology: Wound Location: Left Malleolus - Medial Wound Open Wounding Event: Gradually Appeared Status: Date Acquired: 11/15/2012 Comorbid Congestive Heart Failure, Peripheral Vascular Weeks Of Treatment: 348 History: Disease, Congestive Heart Failure, End Clustered Wound: No Stage Renal Disease, Tobacco Use, Chronic Obstructive Pulmonary Disease (COPD), Type 1 Diabetes Wound Measurements Length: (cm) 0.8 Width: (cm) 0.6 Depth: (cm) 0.1 Area: (cm) 0.377 Volume: (cm) 0.038 Wound Description Classification:  Full Thickness Without Exposed Suppo Structures Wound Flat and Intact Margin: Exudate Small Amount: Exudate Serous Type: Exudate amber Color: Wound Bed Granulation Amount: Large (67-100%) Granulation Quality: Red, Pink Necrotic Amount: None Present (0%) dor After Cleansing: No /Fibrino No Exposed Structure Exposed: No yer (Subcutaneous Tissue) Exposed: Yes Exposed: No Exposed: No Exposed: No Bone Exposed: No % Reduction in Area: 88% % Reduction in Volume: 93.9% Epithelialization: Medium (34-66%) Tunneling: No Undermining: No rt Foul O Slough Fascia Fat La Tendon Muscle Joint Treatment Notes Wound #3 (Left, Medial Malleolus) 2. Periwound Care Moisturizing lotion TCA Cream 3. Primary Dressing Applied Hydrofera Blue 4. Secondary Dressing Dry Gauze Kerramax/Xtrasorb 6. Support Layer Dealer Notes hydrofera classic. foam to lateral side of foot for protection. Electronic Signature(s) Signed: 08/12/2019 5:31:10 PM By: Baruch Gouty RN, BSN Entered By: Baruch Gouty on 08/12/2019 10:31:07 -------------------------------------------------------------------------------- [place] Details Patient Name: Date of Service: [name] 08/12/2019 10:15 AM Medical Record [id number] Patient Account Number: [id number] Date of Birth/Sex: Treating RN: Apr 14, 1956 (64 y.o. [name] Keshon Markovitz: [name] Other Clinician: Referring Fender Herder: Treating Meerab Maselli/Extender:Robson, Tawny Asal in Treatment: 348 Vital Signs Time Taken: 10:21 Temperature (F): 98.4 Height (in): 68 Pulse (bpm): 61 Source: Stated Respiratory Rate (breaths/min): 18 Weight (lbs): 127 Blood Pressure (mmHg): 116/40 Source: Stated Reference Range: 80 - 120 mg / dl Body Mass Index (BMI): 19.3 Electronic Signature(s) Signed: 08/12/2019 5:31:10 PM By: Baruch Gouty RN, BSN Entered By: Baruch Gouty on 08/12/2019 10:22:10

## 2019-08-15 NOTE — Progress Notes (Signed)
[name], [name] ([id number]) Visit Report for 08/12/2019 SuperBill Details Patient Name: Date of Service: [name], [name] 08/12/2019 Medical Record [id number] Patient Account Number: [id number] Date of Birth/Sex: Treating RN: [birth year] (64 y.o. [name] Primary Care Provider: [name] Other Clinician: Referring Provider: Treating Provider/Extender:Leonell Lobdell, Tawny Asal in Treatment: 348 Diagnosis Coding ICD-10 Codes Code Description [id number] Non-pressure chronic ulcer of left ankle with fat layer exposed L97.321 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 breakdown of skin Facility Procedures CPT4 Code Description Modifier Quantity [id number] (Facility Use Only) 29580LT - Dorise Bullion BOOT LT 1 Electronic Signature(s) Signed: 08/12/2019 5:31:10 PM By: Baruch Gouty RN, BSN Signed: 08/15/2019 8:43:01 AM By: Linton Ham MD Entered By: Baruch Gouty on 08/12/2019 10:47:51

## 2019-08-19 ENCOUNTER — Encounter (HOSPITAL_BASED_OUTPATIENT_CLINIC_OR_DEPARTMENT_OTHER): Payer: Medicare Other | Admitting: Internal Medicine

## 2019-08-19 ENCOUNTER — Other Ambulatory Visit: Payer: Self-pay

## 2019-08-19 DIAGNOSIS — E10622 Type 1 diabetes mellitus with other skin ulcer: Secondary | ICD-10-CM | POA: Diagnosis not present

## 2019-08-19 NOTE — Progress Notes (Signed)
LESSIE, FUNDERBURKE ([id number]) Visit Report for 08/19/2019 HPI Details Patient Name: Date of Service: [name], STAMBAUGH 08/19/2019 10:00 AM Medical Record [id number] Patient Account Number: [id number] Date of Birth/Sex: Treating RN: February 11, 1956 (64 y.o. [name] Primary Care Provider: [name] Other Clinician: Referring Provider: Treating Provider/Extender:Carleen Rhue, Tawny Asal in Treatment: 349 History of Present Illness HPI Description: the remaining wound is over the left medial ankle. Similar wound over the right medial ankle healed largely with use of Apligraf. Most recently we have been using Hydrofera Blue over this wound with considerable improvement. The patient has been extensively worked up in the past for her venous insufficiency and she is not a candidate for antireflux surgery although I have none of the details available currently. 08/24/14; considerable improvement today. About 50% of this wound areas now epithelialized. The base of the wound appears to be healthier granulation.as opposed to last week when she had deteriorated a considerable improvement 08/17/14; unfortunately the wound has regressed somewhat. The areas of epithelialization from the superior aspect are not nearly as healthy as they were last week. The patient thinks her Hydrofera Blue slipped. 09/07/14; unfortunately the area has markedly regressed in the 2 weeks since I've seen this. There is an odor surrounding erythema. The healthy granulation tissue that we had at the base of the wound now is a dusky color. The nurse reports green drainage 09/14/14; the area looks somewhat better than last week. There is less erythema and less drainage. The culture I did did not show any growth. Nevertheless I think it is better to continue the Cipro and doxycycline for a further week. The remaining wound area was debridement. 09/21/14. Wound did not require debridement last week. Still  less erythema and less drainage. She can complete her antibiotics. The areas of epithelialization in the superior aspect of the wound do not look as healthy as they did some weeks ago 10/05/14 continued improvement in the condition of this wound. There is advancing epithelialization. Less aggressive debridement required 10/19/14 continued improvement in the condition and volume of this wound. Less aggressive debridement to the inferior part of this to remove surface slough and fibrinous eschar 11/02/14 no debridement is required. The surface granulation appears healthy although some of her islands of epithelialization seem to have regressed. No evidence of infection 11/16/14; lites surface debridement done of surface eschar. The wound does not look to be unhealthy. No evidence of infection. Unfortunately the patient has had podiatry issues in the right foot and for some reason has redeveloped small surface ulcerations in the medial right ankle. Her original presentation involved wounds in this area 11/23/14 no debridement. The area on the right ankle has enlarged. The left ankle wound appears stable in terms of the surface although there is periwound inflammation. There has been regression in the amount of new skin 11/30/14 no debridement. Both wound areas appear healthy. There was no evidence of infection. The the new area on the right medial ankle has enlarged although that both the surfaces appear to be stable. 12/07/14; Debridement of the right medial ankle wound. No no debridement was done on the left. 12/14/14 no major change in and now bilateral medial ankle wounds. Both of these are very painful but the no overt evidence of infection. She has had previous venous ablation 12/21/14; patient states that her right medial ankle wound is considerably more painful last week than usual. Her left is also somewhat painful. She could not tolerate debridement. The right medial ankle  wound has fibrinous  surface eschar 12/28/14 this is a patient with severe bilateral venous insufficiency ulcers. For a considerable period of time we actually had the one on the right medial ankle healed however this recently opened up again in June. The left medial ankle wound has been a refractory area with some absent flows. We had some success with Hydrofera Blue on this area and it literally closed by 50% however it is recently opened up Foley. Both of these were debridement today of surface eschar. She tolerates this poorly 01/25/15: No change in the status of this. Thick adherent escar. Very poor tolerance of any attempt at debridement. I had healed the right medial malleolus wound for a considerable amount of time and had the left one down to about 50% of the volume although this is totally regressed over the last 48 weeks. Further the right leg has reopened. she is trying to make a appointment with pain and vascular, previous ablations with Dr. Aleda Grana. I do not believe there is an arterial insufficiency issue here 02/01/15 the status of the adherent eschar bilaterally is actually improved. No debridement was done. She did not manage to get vascular studies done 02/08/15 continued debridement of the area was done today. The slough is less adherent and comes off with less pressure. There is no surrounding infection peripheral pulses are intact 02/15/15 selective debridement with a disposable curette. Again the slough is less adherent and comes off with less difficulty. No surrounding infection peripheral pulses are intact. 02/22/15 selective debridement of the right medial ankle wound. Slough comes off with less difficulty. No obvious surrounding infection peripheral pulses are intact I did not debridement the one on the left. Both of these are stable to improved 03/01/15 selective debridement of both wound areas using a curette to. Adherent slough cup soft with less difficulty. No obvious surrounding  infection. The patient tells me that 2 days ago she noted a rash above the right leg wrap. She did not have this on her lower legs when she change this over she arrives with widespread left greater than right almost folliculitis-looking rash which is extremely pruritic. I don't see anything to culture here. There is no rash on the rest of her body. She feels well systemically. 03/08/15; selective debridement of both wounds using a curette. Base of this does not look unhealthy. She had limegreen drainage coming out of the left leg wound and describes a lot of drainage. The rash on her left leg looks improved to. No cultures were done. 03/22/15; patient was not here last week. Basal wounds does not look healthy and there is no surrounding erythema. No drainage. There is still a rash on the left leg that almost looks vasculitic however it is clearly limited to the top of where the wrap would be. 04/05/15; on the right required a surgical debridement of surface eschar and necrotic subcutaneous tissue. I did not debridement the area on the left. These continue to be large open wounds that are not changing that much. We were successful at one point in healing the area on the right, and at the same time the area on the left was roughly half the size of current measurements. I think a lot of the deterioration has to do with the prolonged time the patient is on her feet at work 04/19/15 I attempted-like surface debridement bilaterally she does not tolerate this. She tells me that she was in allergic care yesterday with extreme pain over her left lateral  malleolus/ankle and was told that she has an "sprain" 05/03/15; large bilateral venous insufficiency wounds over the medial malleolus/medial aspect of her ankles. She complains of copious amounts of drainage and his usual large amounts of pain. There is some increasing erythema around the wound on the right extending into the medial aspect of her foot to.  historically she came in with these wounds the right one healed and the left one came down to roughly half its current size however the right one is reopened and the left is expanded. 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 05/10/15 large bilateral venous insufficiency wounds. There is less adherence surface left however the surface culture that I did last week grew pseudomonas therefore bilateral selective debridement score necessary. There is surrounding erythema. The patient describes severe bilateral drainage and a lot of pain in the left ankle. Apparently her podiatrist was were ready to do a cortisone shot 05/17/15; the patient complains of pain and again copious amounts of drainage. 05/24/15; we used Iodo flex last week. Patient notes considerable improvement in wound drainage. Only needed to change this once. 05/31/15; we continued Iodoflex; the base of these large wounds bilaterally is not too bad but there is probably likely a significant bioburden here. I would like to debridement just doesn't tolerate it. 06/06/14 I would like to continue the Iodoflex although she still hasn't managed to obtain supplies. She has bilateral medial malleoli or large wounds which are mostly superficial. Both of them are covered circumferentially with some nonviable fibrinous slough although she tolerates debridement very poorly. She apparently has an appointment for an ablation on the right leg by interventional radiology. 06/14/15; the patient arrives with the wounds and static condition. We attempted a debridement although she does not do well with this secondary to pain. I 07/05/15; wounds are not much smaller however there appears to be a cleaner granulating base. The left has tight fibrinous slough greater than the right. Debridement is tolerated poorly due to pain. Iodoflex is done more for these wounds in any of the multitude of different dressings I  have tried on the left 1 and then subsequently the right. 07/12/15; no change in the condition of this wound. I am able to do an aggressive debridement on the right but not the left. She simply cannot tolerate it. We have been using Iodoflex which helps somewhat. It is worthwhile remembering that at one point we healed the right medial ankle wound and the left was about 25% of the current circumference. We have suggested returning to vascular surgery for review of possible further ablations for one reason or another she has not been able to do this. 07/26/15 no major change in the condition of either wound on her medial ankle. I did not attempt to debridement of these. She has been aggressively scrubbing these while she is in the shower at home. She has her supply of Iodoflex which seems to have done more for these wounds then anything I have put on recently. 08/09/15 wound area appears larger although not verified by measurements. Using Iodoflex 09/05/2015 -- she was here for avisit today but had significant problems with the wound and I was asked to see her for a physician opinion. I have summarize that this lady has had surgery on her left lower extremity about 10 years ago where the possible veins stripping was done. She has had an opinion from interventional radiology  around November 2016 where no further sclerotherapy was ordered. The patient works 12 hours a day and stands on a concrete floor with work boots and is unable to get the proper compression she requires and cannot elevate her limbs appropriately at any given time. She has recently grown Pseudomonas from her wound culture but has not started her ciprofloxacin which was called in for her. 09/13/15 this continues to be a difficult situation for this patient. At one point I had this wound down to a 1.5 x 1.5" wound on her left leg. This is deteriorated and the right leg has reopened. She now has substantial wounds on her medial calcaneus,  malleoli and into her lower leg. One on the left has surface eschar but these are far too painful for me to debridement here. She has a vascular surgery appointment next week to see if anything can be done to help here. I think she has had previous ablations several years ago at [facility]. She has no major edema. She tells me that she did not get product last time [facility]] and went for several days without it. She continues to work in work boots 12 hours a day. She cannot get compression/4-layer under her work boots. 09/20/15 no major change. Periwound edema control was not very good. Her point with pain and vascular is next Wednesday the 25th 09/28/15; the patient is seen vascular surgery and is apparently scheduled for repeat duplex ultrasounds of her bilateral lower legs next week. 10/05/15; the patient was seen by Dr. Doren Custard of vascular surgery. He feels that she should have arterial insufficiency excluded as cause/contributed to her nonhealing stage she is therefore booked for an arteriogram. She has apparently monophasic signals in the dorsalis pedis pulses. She also of course has known severe chronic venous insufficiency with previous procedures as noted previously. I had another long discussion with the patient today about her continuing to work 12 hour shifts. I've written her out for 2 months area had concerns about this as her work location is currently undergoing significant turmoil and this may lead to her termination. She is aware of this however I agree with her that she simply cannot continue to stand for 12 hours multiple days a week with the substantial wound areas she has. 10/19/15; the Dr. Doren Custard appointment was largely for an arteriogram which was normal. She does not have an arterial issue. He didn't make a comment about her chronic venous insufficiency for which she has had previous ablations. Presumably it was not felt that anything additional could be done. The patient is  now out of work as I prescribed 2 weeks ago. Her wounds look somewhat less aggravated presumably because of this. I felt I would give debridement another try today 10/25/15; no major change in this patient's wounds. We are struggling to get her product that she can afford into her own home through her insurance. 11/01/15; no major change in the patient's wounds. I have been using silver alginate as the most affordable product. I spoke to Dr. Marla Roe last week with her requested take her to the OR for surgical debridement and placement of ACEL. Dr. Marla Roe told me that she would be willing to do this however [facility] will not cover this, fortunately the patient has [place] healthcare of some variant 11/08/15; no major change in the patient's wounds. She has been completely nonviable surface that this but is in too much pain with any attempted debridement are clinic. I have  arranged for her to see Dr. Marla Roe ham of plastic surgery and this appointment is on Monday. I am hopeful that they will take her to the OR for debridement, possible ACEL ultimately possible skin graft 11/22/15 no major change in the patient's wounds over her bilateral medial calcaneus medial malleolus into the lower legs. Surface on these does not look too bad however on the left there is surrounding erythema and tenderness. This may be cellulitis or could him sleepy tinea. 11/29/15; no major changes in the patient's wounds over her bilateral medial malleolus. There is no infection here and I don't think any additional antibiotics are necessary. There is now plan to move forward. She sees Dr. Marla Roe in a week's time for preparation for operative debridement and ACEL placement I believe on 7/12. She then has a follow-up appointment with Dr. Marla Roe on 7/21 12/28/15; the patient returns today having been taken to the [place] by Dr. Marla Roe 12/12/15 she underwent debridement, intraoperative cultures [which were  negative]. She had placement of a wound VAC. Parent really ACEL was not available to be placed. The wound VAC foam apparently adhered to the wound since then she's been using silver alginate, Xeroform under Ace wraps. She still says there is a lot of drainage and a lot of pain 01/31/16; this is a patient I see monthly. I had referred her to Dr. Marla Roe him of plastic surgery for large wounds on her bilateral medial ankles. She has been to the OR twice once in early July and once in early August. She tells me over the last 3 weeks she has been using the wound VAC with ACEL underneath it. On the right we've simply been using silver alginate. Under Kerlix Coban wraps. 02/28/16; this is a patient I'm currently seeing monthly. She is gone on to have a skin graft over her large venous insufficiency ulcer on the left medial ankle. This was done by Dr. Marla Roe him. The patient is a bit perturbed about why she didn't have one on her right medial ankle wound. She has been using silver alginate to this. 03/06/16; I received a phone call from her plastic surgery Dr. Marla Roe. She expressed some concern about the viability of the skin graft she did on the left medial ankle wound. Asked me to place Endoform on this. She told me she is not planning to do a subsequent skin graft on the right as the left one did not take very well. I had placed Hydrofera Blue on the right 03/13/16; continue to have a reasonably healthy wound on the right medial ankle. Down to 3 mm in terms of size. There is epithelialization here. The area on the left medial ankle is her skin graft site. I suppose the last week this looks somewhat better. She has an open area inferiorly however in the center there appears to be some viable tissue. There is a lot of surface callus and eschar that will eventually need to come off however none of this looked to be infected. Patient states that the is able to keep the dressing on for several days  which is an improvement. 03/20/16 no major change in the circumference of either wound however on the left side the patient was at Dr. Eusebio Friendly office and they did a debridement of left wound. 50% of the wound seems to be epithelialized. I been using Endoform on the left Hydrofera Blue in the right 03/27/16; she arrives today with her wound is not looking as healthy as they did  last week. The area on the right clearly has an adherent surface to this a very similar surface on the left. Unfortunately for this patient this is all too familiar problem. Clearly the Endoform is not working and will need to change that today that has some potential to help this surface. She does not tolerate debridement in this clinic very well. She is changing the dressing wants 04/03/16; patient arrives with the wounds looking somewhat better especially on the right. Dr. Migdalia Dk change the dressing to silver alginate when she saw her on Monday and also sold her some compression socks. The usefulness of the latter is really not clear and woman with severely draining wounds. 04/10/16; the patient is doing a bit of an experiment wearing the compression stockings that Dr. Migdalia Dk provided her to her left leg and the out of legs based dressings that we provided to the right. 05/01/16; the patient is continuing to wear compression stockings Dr. Migdalia Dk provided her on the left that are apparently silver impregnated. She has been using Iodoflex to the right leg wound. Still a moderate amount of drainage, when she leaves here the wraps only last for 4 days. She has to change the stocking on the left leg every night 05/15/16; she is now using compression stockings bilaterally provided by Dr. Marla Roe. She is wearing a nonadherent layer over the wounds so really I don't think there is anything specific being done to this now. She has some reduction on the left wound. The right is stable. I think all healing here is being done  without a specific dressing 06/09/16; patient arrives here today with not much change in the wound certainly in diameter to large circular wounds over the medial aspect of her ankle bilaterally. Under the light of these services are certainly not viable for healing. There is no evidence of surrounding infection. She is wearing compression stockings with some sort of silver impregnation as prescribed by Dr. Marla Roe. She has a follow-up with her tomorrow. 06/30/16; no major change in the size or condition of her wounds. These are still probably covered with a nonviable surface. She is using only her purchase stockings. She did see Dr. Marla Roe who seemed to want to apply Dakin's solution to this I'm not extreme short what value this would be. I would suggest Iodoflex which she still has at home. 07/28/16; I follow Mrs. Allender episodically along with Dr. Marla Roe. 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 She has been applying a topical collagen ointment to both wounds with Adaptic. I don't think Dr. Marla Roe is planning to take her back to the OR. 08/19/16; I follow Mrs. Jeneen Rinks on a monthly basis along with Dr. Marla Roe of plastic surgery. 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 I been following her for a number of years. At one point I was able to get the right medial malleolus wound to heal and had the left medial malleolus down to about half its current size however and I had to send her to plastic surgery for an operative debridement. Since then things have been stable to slightly improve the area on the right is slightly better one in the left about the same although there is much less adherent surface than I'm used to with this patient. She is using some form of liquid collagen gel that Dr. Marla Roe provided a Kerlix cover with the patient's own  pressure stockings. She tells me that she has extreme pain in both ankles and along the lateral aspect of both feet. She has been unable to work for some period of time. She is telling me she is retiring at the beginning of April. She sees Dr. Doran Durand of orthopedics next week 09/22/16; patient has not seen Dr. Marla Roe since the last time she is here. I'm not really sure what she is using to the wounds other than bits and pieces of think she had left over including most recently Hydrofera Blue. She is using juxtalite stockings. She is having difficulty with her husband's recent illness "stroke". She is having to transport him to various doctors appointments. Dr. Marla Roe left her the option of a repeat debridement with ACEL however she has not been able to get the time to follow-up on this. She continues to have a fair amount of drainage out of these wounds with certainly precludes leaving dressings on all week 10/13/16; patient has not seen Dr. Marla Roe since she was last in our clinic. I'm not really sure what she is doing with the wounds, we did try to get her [facility] and I think she is actually using this most of the time. Because of drainage she states she has to change this every second day although this is an improvement from what she used to do. She went to see Dr. Doran Durand who did not think she had a muscular issue with regards to her feet, he referred her to a neurologist and I think the appointment is sometime in June. I changed her back to Iodoflex which she has used in the past but not recently. 11/03/16; the patient has been using Iodoflex although she ran out of this. Still claims that there is a lot of drainage although the wound does not look like this. No surrounding erythema. She has not been back to see Dr. Marla Roe 11/24/16; the patient has been using Iodoflex again but she ran out of it 2 or 3 days ago. There is no major change in the condition of either one of these  wounds in fact they are larger and covered in a thick adherent surface slough/nonviable tissue especially on the left. She does not tolerate mechanical debridement in our clinic. Going back to see Dr. Marla Roe of plastic surgery for an operative debridement would seem reasonable. 12/15/16; the patient has not been back to see Dr. Marla Roe. She is been dealing with a series of illnesses and her husband which of monopolized her time. She is been using Sorbact which we largely supplied. She states the drainage is bad enough that it maximum she can go 2-3 days without changing the dressing 01/12/2017 -- the patient has not been back for about 4 weeks and has not seen Dr. Marla Roe not does she have any appointment pending. 01/23/17; patient has not seen Dr. Marla Roe even though I suggested this previously. She is using Santyl that was suggested last week by Dr. Con Memos this Cost her $16 through her insurance which is indeed surprising 02/12/17; continuing Santyl and the patient is changing this daily. A lot of drainage. She has not been back to see plastic surgery she is using an Ace wrap. Our intake nurse suggested wrap around stockings which would make a good reasonable alternative 02/26/17; patient is been using Santyl and changing this daily due to drainage. She has not been to see plastic surgery she uses in April Ace wrap to control the edema. She did obtain extremitease stockings  but stated that the edema in her leg was to big for these 03/20/17; patient is using Santyl and Anasept. Surfaces looked better today the area on the right is actually measuring a little smaller. She has states she has a lot of pain in her feet and ankles and is asking for a consult to pain control which I'll try to help her with through our case manager. 04/10/17; the patient arrives with better-looking wound surfaces and is slightly smaller wound on the left she is using a combination of Santyl and Anasept. She has  an appointment or at least as started in the pain control center associated with [facility] 05/14/17; this is a patient who I followed for a prolonged period of time. 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 At one point I had this down to a much smaller wound on the left however these reopened and we've never been able to get these to heal. She has been using Santyl and Anasept gel although 2 weeks ago she ran out of the Anasept gel. She has a stable appearance of the wound. She is going to the wound care clinic at [facility]. They wanted do a nerve block/spinal block although she tells me she is reluctant to go forward with that. 05/21/17; this is a patient I have followed for many years. 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 Chronic pain and deformity in her ankles as well. She is been to see plastic surgery as well as orthopedics. Using PolyMem AG most recently/Kerramax/ABDs and 2 layer compression. She has managed to keep this on and she is coming in for a nurse check to change the dressing on Tuesdays, we see her on Fridays 06/05/17; really quite a good looking surface and the area especially on the right medial has contracted in terms of dimensions. Well granulated healthy-looking tissue on both sides. Even with an open curet there is nothing that even feels abnormal here. This is as good as I've seen this in quite some time. We have been using PolyMem AG and bringing her in for a nurse check 06/12/17; really quite good surface on both of these wounds. The right medial has contracted a bit left is not. We've been using PolyMem and AG and she is coming in for a nurse visit 06/19/17; we have been using PolyMem AG and bringing her in for a nurse check. Dimensions of her wounds are not better but the surfaces looked better bilaterally. She complained of bleeding last night and the left wound and increasing pain bilaterally. She  states her wound pain is more neuropathic than just the wounds. There was some suggestion that this was radicular from her pain management doctor in talking to her it is really difficult to sort this out. 06/26/17; using PolyMem and AG and bringing her in for a nurse check as All of this and reasonably stable condition. Certainly not improved. The dimensions on the lateral part of the right leg look better but not really measuring better. The medial aspect on the left is about the same. 07/03/16; we have been using PolyMen AG and bringing her in for a nurse check to change the dressings as the wounds have drainage which precludes once weekly changing. We are using all secondary absorptive dressings.our intake nurse is brought up the idea of using a wound VAC/snap VAC on the wound to help with the drainage to see if this would result in some contraction. This is  not a bad idea. The area on the right medial is actually looking smaller. Both wounds have a reasonable-looking surface. There is no evidence of cellulitis. The edema is well controlled 07/10/17; the patient was denied for a snap VAC by her insurance. The major issue with these wounds continues to be drainage. We are using wicked PolyMem AG and she is coming in for a nurse visit to change this. The wounds are stable to slightly improved. The surface looks vibrant and the area on the right certainly has shrunk in size but very slowly 07/17/17; the patient still has large wounds on her bilateral medial malleoli. Surface of both of these wounds looks better. The dimensions seem to come and go but no consistent improvement. There is no epithelialization. We do not have options for advanced treatment products due to insurance issues. They did not approve of the wound VAC to help control the drainage. More recently we've been using PolyMem and AG wicked to allow drainage through. We have been bringing her in for a nurse visit to change this. We do not  have a lot of options for wound care products and the home again due to insurance issues 07/24/17; the patient's wound actually looks somewhat better today. No drainage measurements are smaller still healthy-looking surface. We used silver collagen under PolyMen started last week. We have been bringing her in for a dressing change 07/31/17; patient's wound surface continued to look better and I think there is visible change in the dimensions of the wound on the right. Rims of epithelialization. We have been using silver collagen under PolyMen and bringing her in for a dressing change. There appears to be less drainage although she is still in need of the dressing change 08/07/17. Patient's wound surface continues to look better on both sides and the area on the right is definitely smaller. We have been using silver collagen and PolyMen. She feels that the drainage has been it has been better. I asked her about her vascular status. She went to see Dr. Aleda Grana at [facility] and had some form of ablation. I don't have much detail on this. I haven't my notes from 2016 that she was not a candidate for any further ablation but I don't have any more information on this. We had referred her to vein and vascular I don't think she ever went. He does not have a history of PAD although I don't have any information on this either. We don't even have ABIs in our record 08/14/17; we've been using silver collagen and PolyMen cover. And putting the patient and compression. She we are bringing her in as a nurse visit to change this because ofarge amount of drainage. We didn't the ABIs in clinic today since they had been done in many moons 1.2 bilaterally. She has been to see vein and vascular however this was at [facility] and she had ablation although I really don't have any information on this all seemed biking get a report. She is also been operatively debrided by plastic surgery and had a cell placed  probably 8-12 months ago. This didn't have a major effect. We've been making some gains with current dressings 08/19/17-She is here in follow-up evaluation for bilateral medial malleoli ulcers. She continues to tolerate debridement very poorly. We will continue with recently changed topical treatment; if no significant improvement may consider switching to Iodosorb/Iodoflex. 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 She has been using silver  collagen and PolyMem. I believe she has been used and tried on Iodoflex before. 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 These have been very refractory over time. We have been using silver collagen and PolyMen. She is coming in once a week for a doctor's and once a week for nurse visits. We are actually making some progress 09/18/17; the patient's wounds are smaller especially on the right medial. She arrives today to upset to consider even washing these off with Anasept which I think is been part of the reason this is been closing. We've been using collagen covered in PolyMen otherwise. It is noted that she has a small area of folliculitis on the right medial calf that. As we are wrapping her legs I'll give her a short course of doxycycline to make sure this doesn't amount to anything. She is a long list of complaints today including imbalance, shortness of breath on exertion, inversion of her left ankle. With regards to the latter complaints she is been to see orthopedics and they offered her a tendon release surgery I believe but wanted her wounds to be closed first. I have recommended she go see her primary physician with regards to everything else. 09/25/17; patient's wounds are about the same size. We have made  some progress bilaterally although not in recent weeks. She will not allow me T wash these wounds with Anasept even if she is doing her cell. Wheeze we've been using collagen covered in PolyMen. Last week she had a small area of folliculitis this is now opened into a small wound. She completed 5 days of trimethoprim sulfamethoxazole 10/02/17; unfortunately the area on her left medial ankle is worse with a larger wound area towards the Achilles. The patient complains of a lot of pain. She will not allow debridement although visually I don't think there is anything to debridement in any case. We have been using silver collagen and PolyMen for several months now. Initially we are making some progress although I'm not really seeing that today. We will move back to [facility]. His admittedly this is a bit of a repeat however I'm hoping that his situation is different now. The patient tells me she had her leg on the left give out on her yesterday this is process some pain. 10/09/17; the patient is seen twice a week largely because of drainage issues coming out of the chronic medial bimalleolar wounds that are chronic. Last week the dimensions of the one on the left looks a little larger I changed her to [facility]. She comes in today with a history of terrible pain in the bilateral wound areas. She will not allow debridement. She will not even allow a tissue culture. There is no surrounding erythema no no evidence of cellulitis. We have been putting her Kerlix Coban man. She will not allow more aggressive compression as there was a suggestion to put her in 3 layer wraps. 10/16/17; large wounds on her bilateral medial malleoli. These are chronic. Not much change from last week. The surface looks have healthy but absolutely no epithelialization. A lot of pain little less so of drainage. She will not allow debridement or even washing these off in the vigorous fashion with Anasept. 10/23/17; large wounds  on her bilateral malleoli which are chronic. Some improvement in terms  of size perhaps on the right since last time I saw these. She states that after we increased the 3 layer compression there was some bleeding, when she came in for a nurse visit she did not want 3 layer compression put back on about our nurse managed to convince her. She has known chronic venous visit issues and I'm hoping to get her to tolerate the 3 layer compression. using Hydrofera Blue 10/30/17; absolutely no change in the condition of either wound although we've had some improvement in dimensions on the right.. Attempted to put her in 3 layer compression she didn't tolerated she is back in 2 layer compression. We've been using Hydrofera Blue We looked over her past records. She had venous reflux studies in November 2016. There was no evidence of deep venous reflux on the right. Superficial vein did not show the greater saphenous vein at think this is been previously ablated the small saphenous vein was within normal limits. The left deep venous system showed no DVT the vessels were positive for deep venous reflux in the posterior tibial veins at the ankle. The greater saphenous vein was surgically absent small saphenous vein was within normal limits. She went to vein and vascular at [facility]. I believe she had an ablation on the left greater saphenous vein. I'll update her reflux studies perhaps ever reviewed by vein and vascular. We've made absolutely no progress in these wounds. Will also try to read and TheraSkins through her insurance 11/06/17; W the patient apparently has a 2 week follow-up with vein and vascular I like him to review the whole issue with regards to her previous vascular workup by Dr. Aleda Grana. We've really made no progress on these wounds in many months. She arrives today with less viable looking surface on the left medial ankle wound. This was apparently looking about the same on Tuesday when  she was here for nurse visit. 11/13/17; deep tissue culture I did last time of the left lower leg showed multiple organisms without any predominating. In particular no Staphylococcus or group A strep were isolated. We sent her for venous reflux studies. She's had a previous left greater saphenous vein stripping and I think sclerotherapy of the right greater saphenous vein. She didn't really look at the lesser saphenous vein this both wounds are on the medial aspect. She has reflux in the common femoral vein and popliteal vein and an accessory vein on the right and the common femoral vein and popliteal vein on the left. I'm going to have her go to see vein and vascular just the look over things and see if anything else beside aggressive compression is indicated here. We have not been able to make any progress on these wounds in spite of the fact that the surface of the wounds is never look too bad. 11/20/17; no major change in the condition of the wounds. Patient reports a large amount of drainage. She has a lot of complaints of pain although enlisting her today I wonder if some of this at least his neuropathic rather than secondary to her wounds. She has an appointment with vein and vascular on 12/30/17. The refractory nature of these wounds in my mind at least need vein and vascular to look over the wounds the recent reflux studies we did and her history to see if anything further can be done here. I also note her gait is deteriorated quite a bit. 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 She has  a bilateral Trendelenburg gait. I wonder if this is neuropathic or perhaps multilevel radicular. 11/27/17; her wounds actually looks slightly better. Healthy-looking granulation tissue a scant amount of epithelialization. [place] healthcare will not pay for [employer]. They will play for tri layer Oasis and Dermagraft. This is not a diabetic ulcer. We'll try for the tri layer Oasis. She still  complains of some drainage. She has a vein and vascular appointment on 12/30/17 12/04/17; the wounds visually look quite good. Healthy-looking granulation with some degree of epithelialization. We are still waiting for response to our request for trial to try layer Oasis. Her appointment with vascular to review venous and arterial issues isn't sold the end of July 7/31. Not allow debridement or even vigorous cleansing of the wound surface. 12/18/17; slightly smaller especially on the right. 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 We've been using Hydrofera Blue. We still are looking into triple layer Oasis through her insurance 01/08/18 on evaluation today patient's wound actually appears to be showing signs of good improvement at this point in time. She has been tolerating the dressing changes without complication. Fortunately there does not appear to be any evidence of infection at this point in time. We have been utilizing silver nitrate which does seem to be of benefit for her which is also good news. Overall I'm very happy with how things seem to be both regards appearance as well as measurement. Patient did see Dr. Bridgett Larsson for evaluation on 12/30/17. In his assessment he felt that stripping would not likely add much more than chronic compression to the patient's healing process. His recommendation was to follow-up in three months with Dr. Doren Custard if she hasn't healed in order to consider referral back to you and see vascular where she previously was in a trial and was able to get her wound to heal. I'll be see what she feels she when you staying compression and he reiterated this as well. 01/13/18 on evaluation today patient appears to actually be doing very well in regard to her bilateral medial malleolus ulcers. She seems to have tolerated the chemical cauterization with silver nitrate last week she did have some pain through that evening but fortunately states that I'll  be see since it seems to be doing better she is overall pleased with the progress. 01/21/18; really quite a remarkable improvement since I've last seen these wounds. We started using silver nitrate specially on the islands of hyper granulation which for some reason her around the wound circumference. This is really done quite nicely. Primary dressing Hydrofera Blue under 4 layer compression. She seems to be able to hold out without a nurse rewrap. Follow-up in 1 week 01/28/18; we've continued the hydrofera blue but continued with chemical cauterization to the wound area that we started about a month ago for irregular hyper granulation. She is made almost stunning improvement in the overall wound dimensions. I was not really expecting this degree of improvement in these chronic wounds 02/05/18; we continue with Hydrofera Bluebut of also continued the aggressive chemical cauterization with silver nitrate. We made nice progress with the right greater than left wound. 02/12/18. We continued with Hydrofera Blue after aggressive chemical cauterization with silver nitrate. We appear to be making nice progress with both wound areas 02/19/2018; we continue with [facility] after washing the wounds vigorously with Anasept spray and chemical cauterization with silver nitrate. We are making excellent progress. The area on the right's just about closed 02/26/2018. The area on the left medial  ankle had too much necrotic debris today. I used a #5 curette we are able to get most of the soft. I continued with the silver nitrate to the much smaller wound on the right medial ankle she had a new area on her right lower pretibial area which she says was due to a role in her compression 03/05/2018; both wound areas look healthy. Not much change in dimensions from last week. I continue to use silver nitrate and Hydrofera Blue. The patient saw Dr. Doren Custard of vein and vascular. He felt she had venous stasis ulcers. He felt  based on her previous arteriogram she should have adequate circulation for healing. Also she has deep venous reflux but really no significant correctable superficial venous reflux at this time. He felt we should continue with conservative management including leg elevation and compression 04/02/2018; since we last saw this woman about a month ago she had a fall apparently suffered a pelvic fracture. I did not look up the x-ray. Nevertheless because of pain she literally was bedbound for 2 weeks and had home health coming out to change the dressing. Somewhat predictably this is resulted in considerable improvement in both wound areas. The right is just about closed on the medial malleolus and the left is about half the size. 04/16/2018; both her wounds continue to go down in size. Using Hydrofera Blue. 05/07/18; both her wounds appeared to be improving especially on the right where it is almost closed. We are using Hydrofera Blue 05/14/2018; slightly worse this week with larger wounds. Surface on the left medial not quite as good. We have been using Hydrofera Blue 05/21/18; again the wounds are slightly larger. Left medial malleolus slightly larger with eschar around the circumference. We have been using Hydrofera Blue undergoing a wraps for a prolonged period of time. This got a lot better when she was more recumbent due to a fall and a back injury. I change the primary dressing the silver alginate today. She did not tolerate a 4 layer compression previously although I may need to bring this up with her next time 05/28/2018; area on the left medial malleolus again is slightly larger with more drainage. Area on the right is roughly unchanged. She has a small area of folliculitis on the right medial just on the lower calf. This does not look ominous. 06/03/2018 left medial malleolus slightly smaller in a better looking surface. We used silver nitrate on this last time with silver alginate. The area  on the right appears slightly smaller 1/10; left medial malleolus slightly smaller. Small open area on the right. We used silver nitrate and silver alginate as of 2 weeks ago. We continue with the wound and compression. These got a lot better when she was off her feet 1/17; right medial malleolus wound is smaller. The left may be slightly smaller. Both surfaces look somewhat better. 1/24; both wounds are slightly smaller. Using silver alginate under Unna boots 1/31; both wounds appear smaller in fact the area on the right medial is just about closed. Surface eschar. We have been using silver alginate under Unna boots. The patient is less active now spends let much less time on her feet and I think this is contributed to the general improvement in the wound condition 2/7; both wounds appear smaller. I was hopeful the right medial would be closed however there there is still the same small open area. Slight amount of surface eschar on the left the dimensions are smaller there is eschar but  the wound edges appear to be free. We have been using silver alginate under Unna boot's 2/14; both wounds once again measure smaller. Circumferential eschar on the left medial. We have been using silver alginate under Unna boots with gradual improvement 2/21; the area on the right medial malleolus has healed. The area on the left is smaller. We have been using silver alginate and Unna boots. We can discharge wrapping the right leg she has 20/30 stockings at home she will need to protect the scar tissue in this area 2/28; the area on the right medial malleolus remains closed the patient has a compression stocking. The area on the left is smaller. We have been using silver alginate and Unna boots. 3/6 the area on the right medial ankle remains closed. Good edema control noted she is using her own compression stocking. The area on the left medial ankle is smaller. We have been managing this with silver alginate and  Unna boots which we will continue today. 3/13; the area on the right medial ankle remains closed and I'm declaring it healed today. When necessary the left is about the same still a healthy-looking surface but no major change and wound area. No evidence of infection and using silver alginate under unna and generally making considerable improvement 3/27 the area on the right medial ankle remains closed the area on the left is about the same as last week. Certainly not any worse we have been using silver alginate under an Unna boot 4/3; the area on the right medial ankle remains closed per the patient. We did not look at this wound. The wound on the left medial ankle is about the same surface looks healthy we have been using silver alginate under an Unna boot 4/10; area on the right medial ankle remains closed per the patient. We did not look at this wound. The wound on the left medial ankle is slightly larger. The patient complains that the [facility] caused burning pain all week. She also told us that she was a lot more active this week. Changed her back to silver alginate 4/17; right medial ankle still closed per the patient. Left medial ankle is slightly larger. Using silver alginate. She did not tolerate Hydrofera Blue on this area 4/24; right medial ankle remains closed we have not look at this. The left medial ankle continues to get larger today by about a centimeter. We have been using silver alginate under Unna boots. She complains about 4 layer compression as an alternative. She has been up on her feet working on her garden 5/8; right medial ankle remains closed we did not look at this. The left medial ankle has increased in size about 100%. We have been using silver alginate under Unna boots. She noted increased pain this week and was not surprised that the wound is deteriorated 5/15; no major change in SA however much less erythema ( one week of doxy ocellulitis). 5/22-64 year old  female returns at 1 week to the clinic for left medial ankle wound for which we have been using silver alginate under 3 layer compression She was placed on DOXY at last visit - the wound is wider at this visit. She is in 3 layer compression 5/29; change to [facility] last week. I had given her empiric doxycycline 2 weeks ago for a week. She is in 3 layer compression. She complains of a lot of pain and drainage on presentation today. 6/5; using Hydrofera Blue. I gave her doxycycline recently empirically for  erythema and pain around the wound. Believe her cultures showed enterococcus which not would not have been well covered by doxycycline nevertheless the wound looks better and I don't feel specifically that the enterococcus needs to be covered. She has a new what looks like a wrap injury on her lateral left ankle. 6/12; she is using Hydrofera Blue. She has a new area on the left anterior lower tibial area. This was a wrap injury last week. 6/19; the patient is using Hydrofera Blue. She arrived with marked inflammation and erythema around the wound and tenderness. 12/01/18 on evaluation today patient appears to be doing a little bit better based on what I'm hearing from the standpoint of lassos evaluation to this as far as the overall appearance of the wound is concerned. Then sometime substandard she typically sees Dr. Dellia Nims. Nonetheless overall very pleased with the progress that she's made up to this point. No fevers, chills, nausea, or vomiting noted at this time. 7/10; some improvement in the surface area. Aggressively debrided last week apparently. I went ahead with the debridement today although the patient does not tolerate this very well. We have been using Iodoflex. Still a fair amount of drainage 7/17; slightly smaller. Using Iodoflex. 7/24; no change from last week in terms of surface area. We have been using Iodoflex. Surface looks and continues to look somewhat better 7/31;  surface area slightly smaller better looking surface. We have been using Iodoflex. This is under Unna boot compression 8/7-Patient presents at 1 week with Unna boot and Iodoflex, wound appears better 8/14-Patient presents at 1 week with Iodoflex, we use the Unna boot, wound appears to be stable better.Patient is getting Botox treatment for the inversion of the foot for tendon release, Next week 8/21; we are using Iodoflex. Unna boot. The wound is stable in terms of surface area. Under illumination there is some areas of the wound that appear to be either epithelialized or perhaps this is adherent slough at this point I was not really clear. It did not wipe off and I was reluctant to debride this today. 8/28; we are using Iodoflex in an Unna boot. Seems to be making good improvement. 9/4; using Iodoflex and wound is slightly smaller. 9/18; we are using Iodoflex with topical silver nitrate when she is here. The wound continues to be smaller 10/2; patient missed her appointment last week due to GI issues. She left and Iodoflex based dressing on for 2 weeks. Wound is about the same size about the size of a dime on the left medial lower 10/9 we have been using Iodoflex on the medial left ankle wound. She has a new superficial probable wrap injury on the dorsal left ankle 10/16; we have been using Hydrofera Blue since last week. This is on the left medial ankle 10/23; we have been using Hydrofera Blue since 2 weeks ago. This is on the left medial ankle. Dimensions are better 11/6; using Hydrofera Blue. I think the wound is smaller but still not closed. Left medial ankle 11/13; we have been using Hydrofera Blue. Wound is certainly no smaller this week. Also the surface not as good. This is the remanent of a very large area on her left medial ankle. 11/20; using Sorbact since last week. Wound was about the same in terms of size although I was disappointed about the surface debris 12/11; 3-week  follow-up. Patient was on vacation. Wound is measuring slightly larger we have been using Sorbact. 12/18; wound is about the same size  however surface looks better last week after debridement. We have been using Sorbact under compression 1/15 wound is probably twice the size of last time increased in length nonviable surface. We have been using Sorbact. She was running a mild fever and missed her appointment last week 1/22; the wound is come down in size but under illumination still a very adherent debris we have been Hydrofera Blue that I changed her to last week 1/29; dimensions down slightly. We have been using Hydrofera Blue 2/19 dimensions are the same however there is rims of epithelialization under illumination. Therefore more the surface area may be epithelialized 2/26; the patient's wound actually measures smaller. The wound looks healthy. We have been using Hydrofera Blue. I had some thoughts about running Apligraf then I still may do that however this looks so much better this week we will delay that for now 3/5; the wound is small but about the same as last week. We have been using Hydrofera Blue. No debridement is required today. 3/19; the wound is about the size of a dime. Healthy looking wound even under illumination. We have been using Hydrofera Blue. No mechanical debridement is necessary Electronic Signature(s) Signed: 08/19/2019 5:09:02 PM By: Linton Ham MD Entered By: Linton Ham on 08/19/2019 11:09:43 -------------------------------------------------------------------------------- Physical Exam Details Patient Name: Date of Service: [name] 08/19/2019 10:00 AM Medical Record [id number] Patient Account Number: [id number] Date of Birth/Sex: Treating RN: Oct 13, 1955 (64 y.o. [name] Primary Care Provider: [name] Other Clinician: Referring Provider: Treating Provider/Extender:Devaun Hernandez, Velva Harman, Essie Christine in Treatment:  349 Respiratory work of breathing is normal. Cardiovascular Pedal pulses are palpable. No major edema. Notes Wound exam; left medial malleolus under illumination appears healthy. No debridement was required. There is no surrounding erythema. Electronic Signature(s) Signed: 08/19/2019 5:09:02 PM By: Linton Ham MD Entered By: Linton Ham on 08/19/2019 11:10:33 -------------------------------------------------------------------------------- Physician Orders Details Patient Name: Date of Service: [name] 08/19/2019 10:00 AM Medical Record [id number] Patient Account Number: [id number] Date of Birth/Sex: Treating RN: 10-30-1955 (64 y.o. 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 - Friday Dressing Change Frequency Wound #3 Left,Medial Malleolus Do not change entire dressing for one week. - change dressing in one week Skin Barriers/Peri-Wound Care Moisturizing lotion TCA Cream or Ointment - mixed with lotion Wound Cleansing Clean wound with Wound Cleanser - clean with anasept with dressing changes May shower with protection. Primary Wound Dressing Wound #3 Left,Medial Malleolus Hydrofera Blue - classic Secondary Dressing Wound #3 Left,Medial Malleolus Dry Gauze Edema Control Unna Boot to Left Lower Extremity - no kerlix layer Avoid standing for long periods of time Elevate legs to the level of the heart or above for 30 minutes daily and/or when sitting, a frequency of: - throughout the day Support Garment 20-30 mm/Hg pressure to: - compression  stocking right leg daily Electronic Signature(s) Signed: 08/19/2019 5:09:02 PM By: Linton Ham MD Signed: 08/19/2019 5:10:17 PM By: Kela Millin Entered By: Kela Millin on 08/19/2019 10:51:38 -------------------------------------------------------------------------------- Problem List Details Patient Name: Date of Service: [name] 08/19/2019 10:00 AM Medical Record [id number] Patient Account Number: [id number] Date of Birth/Sex: Treating RN: Jun 12, 1955 (64 y.o. 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 08/03/2014 08/03/2014 and inflammation L03.116 Cellulitis of left lower limb 09/07/2014 09/07/2014 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 11/16/2014 11/16/2014 and inflammation Resolved Problems ICD-10 Code Description Active Date Resolved Date L97.312 Non-pressure chronic ulcer of right ankle with fat layer 04/10/2016 04/10/2016 exposed Electronic Signature(s) Signed: 08/19/2019 5:09:02 PM By: Linton Ham MD Entered By: Linton Ham on 08/19/2019 11:08:28 -------------------------------------------------------------------------------- Progress Note Details Patient Name: Date of Service: [name] 08/19/2019 10:00 AM Medical Record [id number] Patient Account Number: [id number] Date of Birth/Sex: Treating RN: 01-19-1956 (64 y.o. [name] Primary Care  Provider: [name] Other Clinician: Referring Provider: Treating Provider/Extender:Maxwell Lemen, Tawny Asal in Treatment: 349 Subjective History of Present Illness (HPI) the remaining wound is over the left medial ankle. Similar wound over the right medial ankle healed largely with use of Apligraf. Most recently we have been using Hydrofera Blue over this wound with considerable improvement. The patient has been extensively worked up in the past for her venous insufficiency and she is not a candidate for antireflux surgery although I have none of the details available currently. 08/24/14; considerable improvement today. About 50% of this wound areas now epithelialized. The base of the wound appears to be healthier granulation.as opposed to last week when she had deteriorated a considerable improvement 08/17/14; unfortunately the wound has regressed somewhat. The areas of epithelialization from the superior aspect are not nearly as healthy as they were last week. The patient thinks her Hydrofera Blue slipped. 09/07/14; unfortunately the area has markedly regressed in the 2 weeks since I've seen this. There is an odor surrounding erythema. The healthy granulation tissue that we had at the base of the wound now is a dusky color. The nurse reports green drainage 09/14/14; the area looks somewhat better than last week. There is less erythema and less drainage. The culture I did did not show any growth. Nevertheless I think it is better to continue the Cipro and doxycycline for a further week. The remaining wound area was debridement. 09/21/14. Wound did not require debridement last week. Still less erythema and less drainage. She can complete her antibiotics. The areas of epithelialization in the superior aspect of the wound do not look as healthy as they did some weeks ago 10/05/14 continued improvement in the condition of this wound. There is advancing epithelialization. Less  aggressive debridement required 10/19/14 continued improvement in the condition and volume of this wound. Less aggressive debridement to the inferior part of this to remove surface slough and fibrinous eschar 11/02/14 no debridement is required. The surface granulation appears healthy although some of her islands of epithelialization seem to have regressed. No evidence of infection 11/16/14; lites surface debridement done of surface eschar. The wound does not look to be unhealthy. No evidence of infection. Unfortunately the patient has had podiatry issues in the right foot and for some reason has redeveloped small surface ulcerations in the medial right ankle. Her original presentation involved wounds in this area 11/23/14 no debridement. The area on the right ankle has enlarged.  The left ankle wound appears stable in terms of the surface although there is periwound inflammation. There has been regression in the amount of new skin 11/30/14 no debridement. Both wound areas appear healthy. There was no evidence of infection. The the new area on the right medial ankle has enlarged although that both the surfaces appear to be stable. 12/07/14; Debridement of the right medial ankle wound. No no debridement was done on the left. 12/14/14 no major change in and now bilateral medial ankle wounds. Both of these are very painful but the no overt evidence of infection. She has had previous venous ablation 12/21/14; patient states that her right medial ankle wound is considerably more painful last week than usual. Her left is also somewhat painful. She could not tolerate debridement. The right medial ankle wound has fibrinous surface eschar 12/28/14 this is a patient with severe bilateral venous insufficiency ulcers. For a considerable period of time we actually had the one on the right medial ankle healed however this recently opened up again in June. The left medial ankle wound has been a refractory area with some  absent flows. We had some success with Hydrofera Blue on this area and it literally closed by 50% however it is recently opened up Foley. Both of these were debridement today of surface eschar. She tolerates this poorly 01/25/15: No change in the status of this. Thick adherent escar. Very poor tolerance of any attempt at debridement. I had healed the right medial malleolus wound for a considerable amount of time and had the left one down to about 50% of the volume although this is totally regressed over the last 48 weeks. Further the right leg has reopened. she is trying to make a appointment with pain and vascular, previous ablations with Dr. Aleda Grana. I do not believe there is an arterial insufficiency issue here 02/01/15 the status of the adherent eschar bilaterally is actually improved. No debridement was done. She did not manage to get vascular studies done 02/08/15 continued debridement of the area was done today. The slough is less adherent and comes off with less pressure. There is no surrounding infection peripheral pulses are intact 02/15/15 selective debridement with a disposable curette. Again the slough is less adherent and comes off with less difficulty. No surrounding infection peripheral pulses are intact. 02/22/15 selective debridement of the right medial ankle wound. Slough comes off with less difficulty. No obvious surrounding infection peripheral pulses are intact I did not debridement the one on the left. Both of these are stable to improved 03/01/15 selective debridement of both wound areas using a curette to. Adherent slough cup soft with less difficulty. No obvious surrounding infection. The patient tells me that 2 days ago she noted a rash above the right leg wrap. She did not have this on her lower legs when she change this over she arrives with widespread left greater than right almost folliculitis-looking rash which is extremely pruritic. I don't see anything to culture  here. There is no rash on the rest of her body. She feels well systemically. 03/08/15; selective debridement of both wounds using a curette. Base of this does not look unhealthy. She had limegreen drainage coming out of the left leg wound and describes a lot of drainage. The rash on her left leg looks improved to. No cultures were done. 03/22/15; patient was not here last week. Basal wounds does not look healthy and there is no surrounding erythema. No drainage. There is still a rash on the  left leg that almost looks vasculitic however it is clearly limited to the top of where the wrap would be. 04/05/15; on the right required a surgical debridement of surface eschar and necrotic subcutaneous tissue. I did not debridement the area on the left. These continue to be large open wounds that are not changing that much. We were successful at one point in healing the area on the right, and at the same time the area on the left was roughly half the size of current measurements. I think a lot of the deterioration has to do with the prolonged time the patient is on her feet at work 04/19/15 I attempted-like surface debridement bilaterally she does not tolerate this. She tells me that she was in allergic care yesterday with extreme pain over her left lateral malleolus/ankle and was told that she has an "sprain" 05/03/15; large bilateral venous insufficiency wounds over the medial malleolus/medial aspect of her ankles. She complains of copious amounts of drainage and his usual large amounts of pain. There is some increasing erythema around the wound on the right extending into the medial aspect of her foot to. historically she came in with these wounds the right one healed and the left one came down to roughly half its current size however the right one is reopened and the left is expanded. 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 05/10/15 large bilateral  venous insufficiency wounds. There is less adherence surface left however the surface culture that I did last week grew pseudomonas therefore bilateral selective debridement score necessary. There is surrounding erythema. The patient describes severe bilateral drainage and a lot of pain in the left ankle. Apparently her podiatrist was were ready to do a cortisone shot 05/17/15; the patient complains of pain and again copious amounts of drainage. 05/24/15; we used Iodo flex last week. Patient notes considerable improvement in wound drainage. Only needed to change this once. 05/31/15; we continued Iodoflex; the base of these large wounds bilaterally is not too bad but there is probably likely a significant bioburden here. I would like to debridement just doesn't tolerate it. 06/06/14 I would like to continue the Iodoflex although she still hasn't managed to obtain supplies. She has bilateral medial malleoli or large wounds which are mostly superficial. Both of them are covered circumferentially with some nonviable fibrinous slough although she tolerates debridement very poorly. She apparently has an appointment for an ablation on the right leg by interventional radiology. 06/14/15; the patient arrives with the wounds and static condition. We attempted a debridement although she does not do well with this secondary to pain. I 07/05/15; wounds are not much smaller however there appears to be a cleaner granulating base. The left has tight fibrinous slough greater than the right. Debridement is tolerated poorly due to pain. Iodoflex is done more for these wounds in any of the multitude of different dressings I have tried on the left 1 and then subsequently the right. 07/12/15; no change in the condition of this wound. I am able to do an aggressive debridement on the right but not the left. She simply cannot tolerate it. We have been using Iodoflex which helps somewhat. It is worthwhile remembering that at one  point we healed the right medial ankle wound and the left was about 25% of the current circumference. We have suggested returning to vascular surgery for review of possible further ablations for one reason or  another she has not been able to do this. 07/26/15 no major change in the condition of either wound on her medial ankle. I did not attempt to debridement of these. She has been aggressively scrubbing these while she is in the shower at home. She has her supply of Iodoflex which seems to have done more for these wounds then anything I have put on recently. 08/09/15 wound area appears larger although not verified by measurements. Using Iodoflex 09/05/2015 -- she was here for avisit today but had significant problems with the wound and I was asked to see her for a physician opinion. I have summarize that this lady has had surgery on her left lower extremity about 10 years ago where the possible veins stripping was done. She has had an opinion from interventional radiology around November 2016 where no further sclerotherapy was ordered. The patient works 12 hours a day and stands on a concrete floor with work boots and is unable to get the proper compression she requires and cannot elevate her limbs appropriately at any given time. She has recently grown Pseudomonas from her wound culture but has not started her ciprofloxacin which was called in for her. 09/13/15 this continues to be a difficult situation for this patient. At one point I had this wound down to a 1.5 x 1.5" wound on her left leg. This is deteriorated and the right leg has reopened. She now has substantial wounds on her medial calcaneus, malleoli and into her lower leg. One on the left has surface eschar but these are far too painful for me to debridement here. She has a vascular surgery appointment next week to see if anything can be done to help here. I think she has had previous ablations several years ago at [facility]. She has  no major edema. She tells me that she did not get product last time [facility] Ag] and went for several days without it. She continues to work in work boots 12 hours a day. She cannot get compression/4-layer under her work boots. 09/20/15 no major change. Periwound edema control was not very good. Her point with pain and vascular is next Wednesday the 25th 09/28/15; the patient is seen vascular surgery and is apparently scheduled for repeat duplex ultrasounds of her bilateral lower legs next week. 10/05/15; the patient was seen by Dr. Doren Custard of vascular surgery. He feels that she should have arterial insufficiency excluded as cause/contributed to her nonhealing stage she is therefore booked for an arteriogram. She has apparently monophasic signals in the dorsalis pedis pulses. She also of course has known severe chronic venous insufficiency with previous procedures as noted previously. I had another long discussion with the patient today about her continuing to work 12 hour shifts. I've written her out for 2 months area had concerns about this as her work location is currently undergoing significant turmoil and this may lead to her termination. She is aware of this however I agree with her that she simply cannot continue to stand for 12 hours multiple days a week with the substantial wound areas she has. 10/19/15; the Dr. Doren Custard appointment was largely for an arteriogram which was normal. She does not have an arterial issue. He didn't make a comment about her chronic venous insufficiency for which she has had previous ablations. Presumably it was not felt that anything additional could be done. The patient is now out of work as I prescribed 2 weeks ago. Her wounds look somewhat less aggravated presumably because of  this. I felt I would give debridement another try today 10/25/15; no major change in this patient's wounds. We are struggling to get her product that she can afford into her own home through  her insurance. 11/01/15; no major change in the patient's wounds. I have been using silver alginate as the most affordable product. I spoke to Dr. Marla Roe last week with her requested take her to the OR for surgical debridement and placement of ACEL. Dr. Marla Roe told me that she would be willing to do this however [facility] [facility] will not cover this, fortunately the patient has [place] healthcare of some variant 11/08/15; no major change in the patient's wounds. She has been completely nonviable surface that this but is in too much pain with any attempted debridement are clinic. I have arranged for her to see Dr. Marla Roe ham of plastic surgery and this appointment is on Monday. I am hopeful that they will take her to the OR for debridement, possible ACEL ultimately possible skin graft 11/22/15 no major change in the patient's wounds over her bilateral medial calcaneus medial malleolus into the lower legs. Surface on these does not look too bad however on the left there is surrounding erythema and tenderness. This may be cellulitis or could him sleepy tinea. 11/29/15; no major changes in the patient's wounds over her bilateral medial malleolus. There is no infection here and I don't think any additional antibiotics are necessary. There is now plan to move forward. She sees Dr. Marla Roe in a week's time for preparation for operative debridement and ACEL placement I believe on 7/12. She then has a follow-up appointment with Dr. Marla Roe on 7/21 12/28/15; the patient returns today having been taken to the [facility] by Dr. Marla Roe 12/12/15 she underwent debridement, intraoperative cultures [which were negative]. She had placement of a wound VAC. Parent really ACEL was not available to be placed. The wound VAC foam apparently adhered to the wound since then she's been using silver alginate, Xeroform under Ace wraps. She still says there is a lot of drainage and a lot of pain 01/31/16; this is a  patient I see monthly. I had referred her to Dr. Marla Roe him of plastic surgery for large wounds on her bilateral medial ankles. She has been to the OR twice once in early July and once in early August. She tells me over the last 3 weeks she has been using the wound VAC with ACEL underneath it. On the right we've simply been using silver alginate. Under Kerlix Coban wraps. 02/28/16; this is a patient I'm currently seeing monthly. She is gone on to have a skin graft over her large venous insufficiency ulcer on the left medial ankle. This was done by Dr. Marla Roe him. The patient is a bit perturbed about why she didn't have one on her right medial ankle wound. She has been using silver alginate to this. 03/06/16; I received a phone call from her plastic surgery Dr. Marla Roe. She expressed some concern about the viability of the skin graft she did on the left medial ankle wound. Asked me to place Endoform on this. She told me she is not planning to do a subsequent skin graft on the right as the left one did not take very well. I had placed Hydrofera Blue on the right 03/13/16; continue to have a reasonably healthy wound on the right medial ankle. Down to 3 mm in terms of size. There is epithelialization here. The area on the left medial ankle is  her skin graft site. I suppose the last week this looks somewhat better. She has an open area inferiorly however in the center there appears to be some viable tissue. There is a lot of surface callus and eschar that will eventually need to come off however none of this looked to be infected. Patient states that the is able to keep the dressing on for several days which is an improvement. 03/20/16 no major change in the circumference of either wound however on the left side the patient was at Dr. Eusebio Friendly office and they did a debridement of left wound. 50% of the wound seems to be epithelialized. I been using Endoform on the left Hydrofera Blue in the  right 03/27/16; she arrives today with her wound is not looking as healthy as they did last week. The area on the right clearly has an adherent surface to this a very similar surface on the left. Unfortunately for this patient this is all too familiar problem. Clearly the Endoform is not working and will need to change that today that has some potential to help this surface. She does not tolerate debridement in this clinic very well. She is changing the dressing wants 04/03/16; patient arrives with the wounds looking somewhat better especially on the right. Dr. Migdalia Dk change the dressing to silver alginate when she saw her on Monday and also sold her some compression socks. The usefulness of the latter is really not clear and woman with severely draining wounds. 04/10/16; the patient is doing a bit of an experiment wearing the compression stockings that Dr. Migdalia Dk provided her to her left leg and the out of legs based dressings that we provided to the right. 05/01/16; the patient is continuing to wear compression stockings Dr. Migdalia Dk provided her on the left that are apparently silver impregnated. She has been using Iodoflex to the right leg wound. Still a moderate amount of drainage, when she leaves here the wraps only last for 4 days. She has to change the stocking on the left leg every night 05/15/16; she is now using compression stockings bilaterally provided by Dr. Marla Roe. She is wearing a nonadherent layer over the wounds so really I don't think there is anything specific being done to this now. She has some reduction on the left wound. The right is stable. I think all healing here is being done without a specific dressing 06/09/16; patient arrives here today with not much change in the wound certainly in diameter to large circular wounds over the medial aspect of her ankle bilaterally. Under the light of these services are certainly not viable for healing. There is no evidence of  surrounding infection. She is wearing compression stockings with some sort of silver impregnation as prescribed by Dr. Marla Roe. She has a follow-up with her tomorrow. 06/30/16; no major change in the size or condition of her wounds. These are still probably covered with a nonviable surface. She is using only her purchase stockings. She did see Dr. Marla Roe who seemed to want to apply Dakin's solution to this I'm not extreme short what value this would be. I would suggest Iodoflex which she still has at home. 07/28/16; I follow Mrs. Privott episodically along with Dr. Marla Roe. 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 She has been applying a topical collagen ointment to both wounds with Adaptic. I don't think Dr. Marla Roe is planning to take her back to the OR. 08/19/16; I follow Mrs. Jeneen Rinks  on a monthly basis along with Dr. Marla Roe of plastic surgery. 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 I been following her for a number of years. At one point I was able to get the right medial malleolus wound to heal and had the left medial malleolus down to about half its current size however and I had to send her to plastic surgery for an operative debridement. Since then things have been stable to slightly improve the area on the right is slightly better one in the left about the same although there is much less adherent surface than I'm used to with this patient. She is using some form of liquid collagen gel that Dr. Marla Roe provided a Kerlix cover with the patient's own pressure stockings. She tells me that she has extreme pain in both ankles and along the lateral aspect of both feet. She has been unable to work for some period of time. She is telling me she is retiring at the beginning of April. She sees Dr. Doran Durand of orthopedics next week 09/22/16; patient has  not seen Dr. Marla Roe since the last time she is here. I'm not really sure what she is using to the wounds other than bits and pieces of think she had left over including most recently Hydrofera Blue. She is using juxtalite stockings. She is having difficulty with her husband's recent illness "stroke". She is having to transport him to various doctors appointments. Dr. Marla Roe left her the option of a repeat debridement with ACEL however she has not been able to get the time to follow-up on this. She continues to have a fair amount of drainage out of these wounds with certainly precludes leaving dressings on all week 10/13/16; patient has not seen Dr. Marla Roe since she was last in our clinic. I'm not really sure what she is doing with the wounds, we did try to get her [facility] and I think she is actually using this most of the time. Because of drainage she states she has to change this every second day although this is an improvement from what she used to do. She went to see Dr. Doran Durand who did not think she had a muscular issue with regards to her feet, he referred her to a neurologist and I think the appointment is sometime in June. I changed her back to Iodoflex which she has used in the past but not recently. 11/03/16; the patient has been using Iodoflex although she ran out of this. Still claims that there is a lot of drainage although the wound does not look like this. No surrounding erythema. She has not been back to see Dr. Marla Roe 11/24/16; the patient has been using Iodoflex again but she ran out of it 2 or 3 days ago. There is no major change in the condition of either one of these wounds in fact they are larger and covered in a thick adherent surface slough/nonviable tissue especially on the left. She does not tolerate mechanical debridement in our clinic. Going back to see Dr. Marla Roe of plastic surgery for an operative debridement would seem reasonable. 12/15/16; the  patient has not been back to see Dr. Marla Roe. She is been dealing with a series of illnesses and her husband which of monopolized her time. She is been using Sorbact which we largely supplied. She states the drainage is bad enough that it maximum she can go 2-3 days without changing the dressing 01/12/2017 --  the patient has not been back for about 4 weeks and has not seen Dr. Marla Roe not does she have any appointment pending. 01/23/17; patient has not seen Dr. Marla Roe even though I suggested this previously. She is using Santyl that was suggested last week by Dr. Con Memos this Cost her $16 through her insurance which is indeed surprising 02/12/17; continuing Santyl and the patient is changing this daily. A lot of drainage. She has not been back to see plastic surgery she is using an Ace wrap. Our intake nurse suggested wrap around stockings which would make a good reasonable alternative 02/26/17; patient is been using Santyl and changing this daily due to drainage. She has not been to see plastic surgery she uses in April Ace wrap to control the edema. She did obtain extremitease stockings but stated that the edema in her leg was to big for these 03/20/17; patient is using Santyl and Anasept. Surfaces looked better today the area on the right is actually measuring a little smaller. She has states she has a lot of pain in her feet and ankles and is asking for a consult to pain control which I'll try to help her with through our case manager. 04/10/17; the patient arrives with better-looking wound surfaces and is slightly smaller wound on the left she is using a combination of Santyl and Anasept. She has an appointment or at least as started in the pain control center associated with [facility] 05/14/17; this is a patient who I followed for a prolonged period of time. 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 At one point I had this down to a much smaller wound  on the left however these reopened and we've never been able to get these to heal. She has been using Santyl and Anasept gel although 2 weeks ago she ran out of the Anasept gel. She has a stable appearance of the wound. She is going to the wound care clinic at [facility]. They wanted do a nerve block/spinal block although she tells me she is reluctant to go forward with that. 05/21/17; this is a patient I have followed for many years. 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 Chronic pain and deformity in her ankles as well. She is been to see plastic surgery as well as orthopedics. Using PolyMem AG most recently/Kerramax/ABDs and 2 layer compression. She has managed to keep this on and she is coming in for a nurse check to change the dressing on Tuesdays, we see her on Fridays 06/05/17; really quite a good looking surface and the area especially on the right medial has contracted in terms of dimensions. Well granulated healthy-looking tissue on both sides. Even with an open curet there is nothing that even feels abnormal here. This is as good as I've seen this in quite some time. We have been using PolyMem AG and bringing her in for a nurse check 06/12/17; really quite good surface on both of these wounds. The right medial has contracted a bit left is not. We've been using PolyMem and AG and she is coming in for a nurse visit 06/19/17; we have been using PolyMem AG and bringing her in for a nurse check. Dimensions of her wounds are not better but the surfaces looked better bilaterally. She complained of bleeding last night and the left wound and increasing pain bilaterally. She states her wound pain is more neuropathic than just the wounds. There was some suggestion that this was  radicular from her pain management doctor in talking to her it is really difficult to sort this out. 06/26/17; using PolyMem and AG and bringing her in for a nurse check as All of this and  reasonably stable condition. Certainly not improved. The dimensions on the lateral part of the right leg look better but not really measuring better. The medial aspect on the left is about the same. 07/03/16; we have been using PolyMen AG and bringing her in for a nurse check to change the dressings as the wounds have drainage which precludes once weekly changing. We are using all secondary absorptive dressings.our intake nurse is brought up the idea of using a wound VAC/snap VAC on the wound to help with the drainage to see if this would result in some contraction. This is not a bad idea. The area on the right medial is actually looking smaller. Both wounds have a reasonable-looking surface. There is no evidence of cellulitis. The edema is well controlled 07/10/17; the patient was denied for a snap VAC by her insurance. The major issue with these wounds continues to be drainage. We are using wicked PolyMem AG and she is coming in for a nurse visit to change this. The wounds are stable to slightly improved. The surface looks vibrant and the area on the right certainly has shrunk in size but very slowly 07/17/17; the patient still has large wounds on her bilateral medial malleoli. Surface of both of these wounds looks better. The dimensions seem to come and go but no consistent improvement. There is no epithelialization. We do not have options for advanced treatment products due to insurance issues. They did not approve of the wound VAC to help control the drainage. More recently we've been using PolyMem and AG wicked to allow drainage through. We have been bringing her in for a nurse visit to change this. We do not have a lot of options for wound care products and the home again due to insurance issues 07/24/17; the patient's wound actually looks somewhat better today. No drainage measurements are smaller still healthy-looking surface. We used silver collagen under PolyMen started last week. We have  been bringing her in for a dressing change 07/31/17; patient's wound surface continued to look better and I think there is visible change in the dimensions of the wound on the right. Rims of epithelialization. We have been using silver collagen under PolyMen and bringing her in for a dressing change. There appears to be less drainage although she is still in need of the dressing change 08/07/17. Patient's wound surface continues to look better on both sides and the area on the right is definitely smaller. We have been using silver collagen and PolyMen. She feels that the drainage has been it has been better. I asked her about her vascular status. She went to see Dr. Aleda Grana at [facility] and had some form of ablation. I don't have much detail on this. I haven't my notes from 2016 that she was not a candidate for any further ablation but I don't have any more information on this. We had referred her to vein and vascular I don't think she ever went. He does not have a history of PAD although I don't have any information on this either. We don't even have ABIs in our record 08/14/17; we've been using silver collagen and PolyMen cover. And putting the patient and compression. She we are bringing her in as a nurse visit to change this because ofarge amount  of drainage. We didn't the ABIs in clinic today since they had been done in many moons 1.2 bilaterally. She has been to see vein and vascular however this was at [facility] and she had ablation although I really don't have any information on this all seemed biking get a report. She is also been operatively debrided by plastic surgery and had a cell placed probably 8-12 months ago. This didn't have a major effect. We've been making some gains with current dressings 08/19/17-She is here in follow-up evaluation for bilateral medial malleoli ulcers. She continues to tolerate debridement very poorly. We will continue with recently changed topical  treatment; if no significant improvement may consider switching to Iodosorb/Iodoflex. 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 She has been using silver collagen and PolyMem. I believe she has been used and tried on Iodoflex before. 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 These have been very refractory over time. We have been using silver collagen and PolyMen. She is coming in once a week for a doctor's and once a week for nurse visits. We are actually making some progress 09/18/17; the patient's wounds are smaller especially on the right medial. She arrives today to upset to consider even washing these off with Anasept which I think is been part of the reason this is been closing. We've been using collagen covered in PolyMen otherwise. It is noted that she has a small area of folliculitis on the right medial calf that. As we are wrapping her legs I'll give her a short course of doxycycline to make sure this doesn't amount to anything. She is a long list of complaints today including imbalance, shortness of breath on exertion, inversion of her left ankle. With regards to the latter complaints she is been to see orthopedics and they offered her a tendon release surgery I believe but wanted her wounds to be closed first. I have recommended she go see her primary physician with regards to everything else. 09/25/17; patient's wounds are about the same size. We have made some progress bilaterally although not in recent weeks. She will not allow me T wash these wounds with Anasept even if she is doing her cell. Wheeze we've been using collagen covered in PolyMen. Last week she had a small area of folliculitis this is now opened into a small wound. She  completed 5 days of trimethoprim sulfamethoxazole 10/02/17; unfortunately the area on her left medial ankle is worse with a larger wound area towards the Achilles. The patient complains of a lot of pain. She will not allow debridement although visually I don't think there is anything to debridement in any case. We have been using silver collagen and PolyMen for several months now. Initially we are making some progress although I'm not really seeing that today. We will move back to [facility]. His admittedly this is a bit of a repeat however I'm hoping that his situation is different now. The patient tells me she had her leg on the left give out on her yesterday this is process some pain. 10/09/17; the patient is seen twice a week largely because of drainage issues coming out of the chronic medial bimalleolar wounds that are chronic. Last week the dimensions of the one on the left looks a little  larger I changed her to [facility]. She comes in today with a history of terrible pain in the bilateral wound areas. She will not allow debridement. She will not even allow a tissue culture. There is no surrounding erythema no no evidence of cellulitis. We have been putting her Kerlix Coban man. She will not allow more aggressive compression as there was a suggestion to put her in 3 layer wraps. 10/16/17; large wounds on her bilateral medial malleoli. These are chronic. Not much change from last week. The surface looks have healthy but absolutely no epithelialization. A lot of pain little less so of drainage. She will not allow debridement or even washing these off in the vigorous fashion with Anasept. 10/23/17; large wounds on her bilateral malleoli which are chronic. Some improvement in terms of size perhaps on the right since last time I saw these. She states that after we increased the 3 layer compression there was some bleeding, when she came in for a nurse visit she did not want 3 layer compression  put back on about our nurse managed to convince her. She has known chronic venous visit issues and I'm hoping to get her to tolerate the 3 layer compression. using Hydrofera Blue 10/30/17; absolutely no change in the condition of either wound although we've had some improvement in dimensions on the right.. Attempted to put her in 3 layer compression she didn't tolerated she is back in 2 layer compression. We've been using Hydrofera Blue We looked over her past records. She had venous reflux studies in November 2016. There was no evidence of deep venous reflux on the right. Superficial vein did not show the greater saphenous vein at think this is been previously ablated the small saphenous vein was within normal limits. The left deep venous system showed no DVT the vessels were positive for deep venous reflux in the posterior tibial veins at the ankle. The greater saphenous vein was surgically absent small saphenous vein was within normal limits. She went to vein and vascular at [facility]. I believe she had an ablation on the left greater saphenous vein. I'll update her reflux studies perhaps ever reviewed by vein and vascular. We've made absolutely no progress in these wounds. Will also try to read and TheraSkins through her insurance 11/06/17; W the patient apparently has a 2 week follow-up with vein and vascular I like him to review the whole issue with regards to her previous vascular workup by Dr. Aleda Grana. We've really made no progress on these wounds in many months. She arrives today with less viable looking surface on the left medial ankle wound. This was apparently looking about the same on Tuesday when she was here for nurse visit. 11/13/17; deep tissue culture I did last time of the left lower leg showed multiple organisms without any predominating. In particular no Staphylococcus or group A strep were isolated. We sent her for venous reflux studies. She's had a previous left  greater saphenous vein stripping and I think sclerotherapy of the right greater saphenous vein. She didn't really look at the lesser saphenous vein this both wounds are on the medial aspect. She has reflux in the common femoral vein and popliteal vein and an accessory vein on the right and the common femoral vein and popliteal vein on the left. I'm going to have her go to see vein and vascular just the look over things and see if anything else beside aggressive compression is indicated here. We have not been able to  make any progress on these wounds in spite of the fact that the surface of the wounds is never look too bad. 11/20/17; no major change in the condition of the wounds. Patient reports a large amount of drainage. She has a lot of complaints of pain although enlisting her today I wonder if some of this at least his neuropathic rather than secondary to her wounds. She has an appointment with vein and vascular on 12/30/17. The refractory nature of these wounds in my mind at least need vein and vascular to look over the wounds the recent reflux studies we did and her history to see if anything further can be done here. I also note her gait is deteriorated quite a bit. 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 She has a bilateral Trendelenburg gait. I wonder if this is neuropathic or perhaps multilevel radicular. 11/27/17; her wounds actually looks slightly better. Healthy-looking granulation tissue a scant amount of epithelialization. [place] [facility] will not pay for [employer]. They will play for tri layer Oasis and Dermagraft. This is not a diabetic ulcer. We'll try for the tri layer Oasis. She still complains of some drainage. She has a vein and vascular appointment on 12/30/17 12/04/17; the wounds visually look quite good. Healthy-looking granulation with some degree of epithelialization. We are still waiting for response to our request for trial to try layer Oasis. Her  appointment with vascular to review venous and arterial issues isn't sold the end of July 7/31. Not allow debridement or even vigorous cleansing of the wound surface. 12/18/17; slightly smaller especially on the right. 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 We've been using Hydrofera Blue. We still are looking into triple layer Oasis through her insurance 01/08/18 on evaluation today patient's wound actually appears to be showing signs of good improvement at this point in time. She has been tolerating the dressing changes without complication. Fortunately there does not appear to be any evidence of infection at this point in time. We have been utilizing silver nitrate which does seem to be of benefit for her which is also good news. Overall I'm very happy with how things seem to be both regards appearance as well as measurement. Patient did see Dr. Bridgett Larsson for evaluation on 12/30/17. In his assessment he felt that stripping would not likely add much more than chronic compression to the patient's healing process. His recommendation was to follow-up in three months with Dr. Doren Custard if she hasn't healed in order to consider referral back to you and see vascular where she previously was in a trial and was able to get her wound to heal. I'll be see what she feels she when you staying compression and he reiterated this as well. 01/13/18 on evaluation today patient appears to actually be doing very well in regard to her bilateral medial malleolus ulcers. She seems to have tolerated the chemical cauterization with silver nitrate last week she did have some pain through that evening but fortunately states that I'll be see since it seems to be doing better she is overall pleased with the progress. 01/21/18; really quite a remarkable improvement since I've last seen these wounds. We started using silver nitrate specially on the islands of hyper granulation which for some reason her  around the wound circumference. This is really done quite nicely. Primary dressing Hydrofera Blue under 4 layer compression. She seems to be able to hold out without a nurse rewrap. Follow-up in 1 week 01/28/18; we've continued  the hydrofera blue but continued with chemical cauterization to the wound area that we started about a month ago for irregular hyper granulation. She is made almost stunning improvement in the overall wound dimensions. I was not really expecting this degree of improvement in these chronic wounds 02/05/18; we continue with Hydrofera Bluebut of also continued the aggressive chemical cauterization with silver nitrate. We made nice progress with the right greater than left wound. 02/12/18. We continued with Hydrofera Blue after aggressive chemical cauterization with silver nitrate. We appear to be making nice progress with both wound areas 02/19/2018; we continue with [facility] after washing the wounds vigorously with Anasept spray and chemical cauterization with silver nitrate. We are making excellent progress. The area on the right's just about closed 02/26/2018. The area on the left medial ankle had too much necrotic debris today. I used a #5 curette we are able to get most of the soft. I continued with the silver nitrate to the much smaller wound on the right medial ankle she had a new area on her right lower pretibial area which she says was due to a role in her compression 03/05/2018; both wound areas look healthy. Not much change in dimensions from last week. I continue to use silver nitrate and Hydrofera Blue. The patient saw Dr. Doren Custard of vein and vascular. He felt she had venous stasis ulcers. He felt based on her previous arteriogram she should have adequate circulation for healing. Also she has deep venous reflux but really no significant correctable superficial venous reflux at this time. He felt we should continue with conservative management including leg elevation  and compression 04/02/2018; since we last saw this woman about a month ago she had a fall apparently suffered a pelvic fracture. I did not look up the x-ray. Nevertheless because of pain she literally was bedbound for 2 weeks and had home health coming out to change the dressing. Somewhat predictably this is resulted in considerable improvement in both wound areas. The right is just about closed on the medial malleolus and the left is about half the size. 04/16/2018; both her wounds continue to go down in size. Using Hydrofera Blue. 05/07/18; both her wounds appeared to be improving especially on the right where it is almost closed. We are using Hydrofera Blue 05/14/2018; slightly worse this week with larger wounds. Surface on the left medial not quite as good. We have been using Hydrofera Blue 05/21/18; again the wounds are slightly larger. Left medial malleolus slightly larger with eschar around the circumference. We have been using Hydrofera Blue undergoing a wraps for a prolonged period of time. This got a lot better when she was more recumbent due to a fall and a back injury. I change the primary dressing the silver alginate today. She did not tolerate a 4 layer compression previously although I may need to bring this up with her next time 05/28/2018; area on the left medial malleolus again is slightly larger with more drainage. Area on the right is roughly unchanged. She has a small area of folliculitis on the right medial just on the lower calf. This does not look ominous. 06/03/2018 left medial malleolus slightly smaller in a better looking surface. We used silver nitrate on this last time with silver alginate. The area on the right appears slightly smaller 1/10; left medial malleolus slightly smaller. Small open area on the right. We used silver nitrate and silver alginate as of 2 weeks ago. We continue with the wound and compression.  These got a lot better when she was off her feet 1/17;  right medial malleolus wound is smaller. The left may be slightly smaller. Both surfaces look somewhat better. 1/24; both wounds are slightly smaller. Using silver alginate under Unna boots 1/31; both wounds appear smaller in fact the area on the right medial is just about closed. Surface eschar. We have been using silver alginate under Unna boots. The patient is less active now spends let much less time on her feet and I think this is contributed to the general improvement in the wound condition 2/7; both wounds appear smaller. I was hopeful the right medial would be closed however there there is still the same small open area. Slight amount of surface eschar on the left the dimensions are smaller there is eschar but the wound edges appear to be free. We have been using silver alginate under Unna boot's 2/14; both wounds once again measure smaller. Circumferential eschar on the left medial. We have been using silver alginate under Unna boots with gradual improvement 2/21; the area on the right medial malleolus has healed. The area on the left is smaller. We have been using silver alginate and Unna boots. We can discharge wrapping the right leg she has 20/30 stockings at home she will need to protect the scar tissue in this area 2/28; the area on the right medial malleolus remains closed the patient has a compression stocking. The area on the left is smaller. We have been using silver alginate and Unna boots. 3/6 the area on the right medial ankle remains closed. Good edema control noted she is using her own compression stocking. The area on the left medial ankle is smaller. We have been managing this with silver alginate and Unna boots which we will continue today. 3/13; the area on the right medial ankle remains closed and I'm declaring it healed today. When necessary the left is about the same still a healthy-looking surface but no major change and wound area. No evidence of infection  and using silver alginate under unna and generally making considerable improvement 3/27 the area on the right medial ankle remains closed the area on the left is about the same as last week. Certainly not any worse we have been using silver alginate under an Unna boot 4/3; the area on the right medial ankle remains closed per the patient. We did not look at this wound. The wound on the left medial ankle is about the same surface looks healthy we have been using silver alginate under an Unna boot 4/10; area on the right medial ankle remains closed per the patient. We did not look at this wound. The wound on the left medial ankle is slightly larger. The patient complains that the [facility] caused burning pain all week. She also told us that she was a lot more active this week. Changed her back to silver alginate 4/17; right medial ankle still closed per the patient. Left medial ankle is slightly larger. Using silver alginate. She did not tolerate Hydrofera Blue on this area 4/24; right medial ankle remains closed we have not look at this. The left medial ankle continues to get larger today by about a centimeter. We have been using silver alginate under Unna boots. She complains about 4 layer compression as an alternative. She has been up on her feet working on her garden 5/8; right medial ankle remains closed we did not look at this. The left medial ankle has increased in size about  100%. We have been using silver alginate under Unna boots. She noted increased pain this week and was not surprised that the wound is deteriorated 5/15; no major change in SA however much less erythema ( one week of doxy ocellulitis). 5/22-64 year old female returns at 1 week to the clinic for left medial ankle wound for which we have been using silver alginate under 3 layer compression She was placed on DOXY at last visit - the wound is wider at this visit. She is in 3 layer compression 5/29; change to [facility] last week. I had given her empiric doxycycline 2 weeks ago for a week. She is in 3 layer compression. She complains of a lot of pain and drainage on presentation today. 6/5; using Hydrofera Blue. I gave her doxycycline recently empirically for erythema and pain around the wound. Believe her cultures showed enterococcus which not would not have been well covered by doxycycline nevertheless the wound looks better and I don't feel specifically that the enterococcus needs to be covered. She has a new what looks like a wrap injury on her lateral left ankle. 6/12; she is using Hydrofera Blue. She has a new area on the left anterior lower tibial area. This was a wrap injury last week. 6/19; the patient is using Hydrofera Blue. She arrived with marked inflammation and erythema around the wound and tenderness. 12/01/18 on evaluation today patient appears to be doing a little bit better based on what I'm hearing from the standpoint of lassos evaluation to this as far as the overall appearance of the wound is concerned. Then sometime substandard she typically sees Dr. Dellia Nims. Nonetheless overall very pleased with the progress that she's made up to this point. No fevers, chills, nausea, or vomiting noted at this time. 7/10; some improvement in the surface area. Aggressively debrided last week apparently. I went ahead with the debridement today although the patient does not tolerate this very well. We have been using Iodoflex. Still a fair amount of drainage 7/17; slightly smaller. Using Iodoflex. 7/24; no change from last week in terms of surface area. We have been using Iodoflex. Surface looks and continues to look somewhat better 7/31; surface area slightly smaller better looking surface. We have been using Iodoflex. This is under Unna boot compression 8/7-Patient presents at 1 week with Unna boot and Iodoflex, wound appears better 8/14-Patient presents at 1 week with Iodoflex, we use the Unna boot,  wound appears to be stable better.Patient is getting Botox treatment for the inversion of the foot for tendon release, Next week 8/21; we are using Iodoflex. Unna boot. The wound is stable in terms of surface area. Under illumination there is some areas of the wound that appear to be either epithelialized or perhaps this is adherent slough at this point I was not really clear. It did not wipe off and I was reluctant to debride this today. 8/28; we are using Iodoflex in an Unna boot. Seems to be making good improvement. 9/4; using Iodoflex and wound is slightly smaller. 9/18; we are using Iodoflex with topical silver nitrate when she is here. The wound continues to be smaller 10/2; patient missed her appointment last week due to GI issues. She left and Iodoflex based dressing on for 2 weeks. Wound is about the same size about the size of a dime on the left medial lower 10/9 we have been using Iodoflex on the medial left ankle wound. She has a new superficial probable wrap injury on the dorsal left  ankle 10/16; we have been using Hydrofera Blue since last week. This is on the left medial ankle 10/23; we have been using Hydrofera Blue since 2 weeks ago. This is on the left medial ankle. Dimensions are better 11/6; using Hydrofera Blue. I think the wound is smaller but still not closed. Left medial ankle 11/13; we have been using Hydrofera Blue. Wound is certainly no smaller this week. Also the surface not as good. This is the remanent of a very large area on her left medial ankle. 11/20; using Sorbact since last week. Wound was about the same in terms of size although I was disappointed about the surface debris 12/11; 3-week follow-up. Patient was on vacation. Wound is measuring slightly larger we have been using Sorbact. 12/18; wound is about the same size however surface looks better last week after debridement. We have been using Sorbact under compression 1/15 wound is probably twice the size  of last time increased in length nonviable surface. We have been using Sorbact. She was running a mild fever and missed her appointment last week 1/22; the wound is come down in size but under illumination still a very adherent debris we have been Hydrofera Blue that I changed her to last week 1/29; dimensions down slightly. We have been using Hydrofera Blue 2/19 dimensions are the same however there is rims of epithelialization under illumination. Therefore more the surface area may be epithelialized 2/26; the patient's wound actually measures smaller. The wound looks healthy. We have been using Hydrofera Blue. I had some thoughts about running Apligraf then I still may do that however this looks so much better this week we will delay that for now 3/5; the wound is small but about the same as last week. We have been using Hydrofera Blue. No debridement is required today. 3/19; the wound is about the size of a dime. Healthy looking wound even under illumination. We have been using Hydrofera Blue. No mechanical debridement is necessary Objective Constitutional Vitals Time Taken: 10:16 AM, Height: 68 in, Source: Stated, Weight: 127 lbs, Source: Stated, BMI: 19.3, Temperature: 98.0 F, Pulse: 70 bpm, Respiratory Rate: 18 breaths/min, Blood Pressure: 112/50 mmHg. Respiratory work of breathing is normal. Cardiovascular Pedal pulses are palpable. No major edema. General Notes: Wound exam; left medial malleolus under illumination appears healthy. No debridement was required. There is no surrounding erythema. Integumentary (Hair, Skin) Wound #3 status is Open. Original cause of wound was Gradually Appeared. The wound is located on the Left,Medial Malleolus. The wound measures 1.2cm length x 0.9cm width x 0.1cm depth; 0.848cm^2 area and 0.085cm^3 volume. There is Fat Layer (Subcutaneous Tissue) Exposed exposed. There is no tunneling or undermining noted. There is a medium amount of  serosanguineous drainage noted. The wound margin is flat and intact. There is large (67-100%) red, pink granulation within the wound bed. There is no necrotic tissue within the wound bed. 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 There was a Haematologist Compression Therapy Procedure by Deon Pilling, RN. Post procedure Diagnosis Wound #3: Same as Pre-Procedure Plan Follow-up Appointments: Return Appointment in 1 week. - Friday Dressing Change Frequency: Wound #3 Left,Medial Malleolus: Do not change entire dressing for one week. - change dressing in one week Skin Barriers/Peri-Wound Care: Moisturizing lotion TCA Cream or Ointment -  mixed with lotion Wound Cleansing: Clean wound with Wound Cleanser - clean with anasept with dressing changes May shower with protection. Primary Wound Dressing: Wound #3 Left,Medial Malleolus: Hydrofera Blue - classic Secondary Dressing: Wound #3 Left,Medial Malleolus: Dry Gauze Edema Control: Unna Boot to Left Lower Extremity - no kerlix layer Avoid standing for long periods of time Elevate legs to the level of the heart or above for 30 minutes daily and/or when sitting, a frequency of: - throughout the day Support Garment 20-30 mm/Hg pressure to: - compression stocking right leg daily 1. TCA 2. I am continuing Hydrofera Blue seems little reason to change 3. Continuing under an Haematologist. Although she does not have a lot of edema she will need compression indefinitely. Previous experience that shown this Electronic Signature(s) Signed: 08/19/2019 5:09:02 PM By: Linton Ham MD Entered By: Linton Ham on 08/19/2019  11:11:21 -------------------------------------------------------------------------------- SuperBill Details Patient Name: Date of Service: [name] 08/19/2019 Medical Record [id number] Patient Account Number: [id number] Date of Birth/Sex: Treating RN: 1955/12/04 (64 y.o. [name] Primary Care Provider: [name] Other Clinician: Referring Provider: Treating Provider/Extender:Estiven Kohan, Tawny Asal in Treatment: 349 Diagnosis Coding ICD-10 Codes Code Description [phone] Non-pressure chronic ulcer of left ankle with fat layer exposed L97.321 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 breakdown of skin Facility Procedures CPT4 Code: [id number] Description: 29580 - APPLY Louretta Parma BOOT/PROFO BILATERAL Modifier: Quantity: 1 Physician Procedures CPT4 Code Description: [id number] - WC PHYS LEVEL 3 - EST PT ICD-10 Diagnosis Description J94.174 Non-pressure chronic ulcer of left ankle with fat lay L97.321 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 b Modifier: er exposed reakdown of skin Quantity: 1 Electronic Signature(s) Signed: 08/19/2019 5:09:02 PM By: Linton Ham MD Entered By: Linton Ham on 08/19/2019 11:11:40

## 2019-08-22 ENCOUNTER — Ambulatory Visit (INDEPENDENT_AMBULATORY_CARE_PROVIDER_SITE_OTHER): Payer: Medicare Other

## 2019-08-22 ENCOUNTER — Other Ambulatory Visit: Payer: Self-pay

## 2019-08-22 ENCOUNTER — Ambulatory Visit (INDEPENDENT_AMBULATORY_CARE_PROVIDER_SITE_OTHER): Payer: Medicare Other | Admitting: Podiatry

## 2019-08-22 DIAGNOSIS — M21542 Acquired clubfoot, left foot: Secondary | ICD-10-CM

## 2019-08-22 DIAGNOSIS — R269 Unspecified abnormalities of gait and mobility: Secondary | ICD-10-CM | POA: Diagnosis not present

## 2019-08-22 DIAGNOSIS — M79671 Pain in right foot: Secondary | ICD-10-CM | POA: Diagnosis not present

## 2019-08-22 DIAGNOSIS — M79672 Pain in left foot: Secondary | ICD-10-CM

## 2019-08-22 DIAGNOSIS — M25572 Pain in left ankle and joints of left foot: Secondary | ICD-10-CM

## 2019-08-22 DIAGNOSIS — M216X2 Other acquired deformities of left foot: Secondary | ICD-10-CM | POA: Diagnosis not present

## 2019-08-23 NOTE — Progress Notes (Signed)
[name], [name] ([id number]) Visit Report for 08/19/2019 Arrival Information Details Patient Name: Date of Service: [name], [name] 08/19/2019 10:00 AM Medical Record [id number] Patient Account Number: [id number] Date of Birth/Sex: Treating RN: [birth year] (64 y.o. [name] Othman Masur: [name] Other Clinician: Referring Vernor Monnig: Treating Annagrace Carr/Extender:Robson, Tawny Asal in Treatment: 349 Visit Information History Since Last Visit Cane Added or deleted any medications: No Patient Arrived: 10:13 Any new allergies or adverse reactions: No Arrival Time: Had a fall or experienced change in No Accompanied By: self None activities of daily living that may affect Transfer Assistance: risk of falls: Patient Requires Transmission-Based No Signs or symptoms of abuse/neglect since last No Precautions: visito Patient Has Alerts: No Hospitalized since last visit: No Implantable device outside of the clinic excluding No cellular tissue based products placed in the center since last visit: Has Dressing in Place as Prescribed: Yes Has Compression in Place as Prescribed: Yes Pain Present Now: Yes Electronic Signature(s) Signed: 08/23/2019 5:59:14 PM By: Baruch Gouty RN, BSN Entered By: Baruch Gouty on 08/19/2019 10:16:46 -------------------------------------------------------------------------------- Compression Therapy Details Patient Name: Date of Service: [name] 08/19/2019 10:00 AM Medical Record [id number] Patient Account Number: [id number] Date of Birth/Sex: Treating RN: 07/14/1955 (64 y.o. [name] Primary Care Kenyatte Gruber: [name] Other Clinician: Referring Verlena Marlette: Treating Ellise Kovack/Extender:Robson, Tawny Asal in Treatment: 349 Compression Therapy Performed for Wound Wound #3 Left,Medial Malleolus Assessment: Performed By: Clinician Deon Pilling, RN Compression  Type: Rolena Infante Post Procedure Diagnosis Same as Pre-procedure Electronic Signature(s) Signed: 08/19/2019 5:10:17 PM By: Kela Millin Entered By: Kela Millin on 08/19/2019 10:53:37 -------------------------------------------------------------------------------- Encounter Discharge Information Details Patient Name: Date of Service: [name] 08/19/2019 10:00 AM Medical Record [id number] Patient Account Number: [id number] Date of Birth/Sex: Treating RN: [birth year] (64 y.o. [name] Primary Care Jordanny Waddington: [name] Other Clinician: Referring Dalya Maselli: Treating Dameian Crisman/Extender:Robson, Tawny Asal in Treatment: 349 Encounter Discharge Information Items Discharge Condition: Stable Ambulatory Status: Cane Discharge Destination: Home Transportation: Private Auto Accompanied By: self Schedule Follow-up Appointment: Yes Clinical Summary of Care: Electronic Signature(s) Signed: 08/19/2019 5:19:35 PM By: Deon Pilling Entered By: Deon Pilling on 08/19/2019 11:14:12 -------------------------------------------------------------------------------- Lower Extremity Assessment Details Patient Name: Date of Service: [name], [name] 08/19/2019 10:00 AM Medical Record [id number] Patient Account Number: [id number] Date of Birth/Sex: Treating RN: June 12, 1955 (64 y.o. [name] Katerina Zurn: [name] Other Clinician: Referring Lamyra Malcolm: Treating Myrtle Barnhard/Extender:Robson, Velva Harman, Essie Christine in Treatment: 349 Edema Assessment Assessed: [Left: No] [Right: No] Edema: [Left: N] [Right: o] Calf Left: Right: Point of Measurement: 33 cm From Medial Instep 29.4 cm cm Ankle Left: Right: Point of Measurement: 12 cm From Medial Instep 20.8 cm cm Vascular Assessment Pulses: Dorsalis Pedis Palpable: [Left:Yes] Electronic Signature(s) Signed: 08/23/2019 5:59:14 PM By: Baruch Gouty RN, BSN Entered By:  Baruch Gouty on 08/19/2019 10:25:19 -------------------------------------------------------------------------------- Multi Wound Chart Details Patient Name: Date of Service: [name] 08/19/2019 10:00 AM Medical Record [id number] Patient Account Number: [id number] Date of Birth/Sex: Treating RN: [birth year] (64 y.o. [name] Primary Care Andie Mortimer: [name] Other Clinician: Referring Hovanes Hymas: Treating Artavious Trebilcock/Extender:Robson, Velva Harman, Essie Christine in Treatment: 349 Vital Signs Height(in): 68 Pulse(bpm): 70 Weight(lbs): 127 Blood Pressure(mmHg): 112/50 Body Mass Index(BMI): 19 Temperature(F): 98.0 Respiratory 18 Rate(breaths/min): Photos: [3:No Photos] [N/A:N/A] Wound Location: [3:Left Malleolus - Medial] [N/A:N/A] Wounding Event: [3:Gradually Appeared] [N/A:N/A] Primary Etiology: [3:Venous Leg Ulcer] [N/A:N/A] Comorbid History: [3:Congestive Heart Failure, Peripheral Vascular Disease, Congestive Heart Failure, End Stage Renal  Disease, Tobacco Use, Chronic Obstructive Pulmonary Disease (COPD), Type 1 Diabetes] [N/A:N/A] Date Acquired: [3:11/15/2012] [N/A:N/A] Weeks of Treatment: [4:665] [N/A:N/A] Wound Status: [3:Open] [N/A:N/A] Measurements L x W x D 1.2x0.9x0.1 [N/A:N/A] (cm) Area (cm) : [3:0.848] [N/A:N/A] Volume (cm) : [3:0.085] [N/A:N/A] % Reduction in Area: [3:73.00%] [N/A:N/A] % Reduction in Volume: [3:86.50%] [N/A:N/A] Classification: [3:Full Thickness Without Exposed Support Structures] [N/A:N/A] Exudate Amount: [3:Medium] [N/A:N/A] Exudate Type: [3:Serosanguineous] [N/A:N/A] Exudate Color: [3:red, brown] [N/A:N/A] Wound Margin: [3:Flat and Intact] [N/A:N/A] Granulation Amount: [3:Large (67-100%)] [N/A:N/A] Granulation Quality: [3:Red, Pink] [N/A:N/A] Necrotic Amount: [3:None Present (0%)] [N/A:N/A] Exposed Structures: [3:Fat Layer (Subcutaneous N/A Tissue) Exposed: Yes Fascia: No Tendon: No Muscle: No Joint: No Bone:  No] Epithelialization: [3:Small (1-33%) Compression Therapy] [N/A:N/A N/A] Treatment Notes Electronic Signature(s) Signed: 08/19/2019 5:09:02 PM By: Linton Ham MD Signed: 08/19/2019 5:10:17 PM By: Kela Millin Entered By: Linton Ham on 08/19/2019 11:08:37 -------------------------------------------------------------------------------- Multi-Disciplinary Care Plan Details Patient Name: Date of Service: [name]. 08/19/2019 10:00 AM Medical Record [id number] Patient Account Number: [id number] Date of Birth/Sex: Treating RN: August 16, 1955 (64 y.o. [name] Primary Care Jenavie Stanczak: [name] Other Clinician: Referring Ronelle Michie: Treating Zayley Arras/Extender:Robson, Velva Harman, Essie Christine in Treatment: 26 Active Inactive Abuse / Safety / Falls / Self Care Management Nursing Diagnoses: History of Falls Potential for falls Goals: Patient will not experience any injury related to falls Date Initiated: 08/05/2019 Target Resolution Date: 09/02/2019 Goal Status: Active Patient/caregiver will verbalize/demonstrate measures taken to prevent injury and/or falls Date Initiated: 08/05/2019 Target Resolution Date: 09/02/2019 Goal Status: Active Interventions: Assess fall risk on admission and as needed Assess impairment of mobility on admission and as needed per policy Notes: Venous Leg Ulcer Nursing Diagnoses: Actual venous Insuffiency (use after diagnosis is confirmed) Goals: Patient will maintain optimal edema control Date Initiated: 09/04/2017 Target Resolution Date: 08/12/2019 Goal Status: Active Patient/caregiver will verbalize understanding of disease process and disease management Date Initiated: 08/03/2014 Date Inactivated: 08/28/2017 Target Resolution Date: 08/29/2017 Goal Status: Met Interventions: Compression as ordered Treatment Activities: Therapeutic compression applied : 09/21/2015 Notes: Wound/Skin Impairment Nursing Diagnoses: Impaired  tissue integrity Knowledge deficit related to ulceration/compromised skin integrity Goals: Patient will have a decrease in wound volume by X% from date: (specify in notes) Date Initiated: 04/05/2015 Date Inactivated: 02/12/2017 Target Resolution Date: 09/01/2016 Unmet Reason: chronic, Goal Status: Unmet unable to tolerate stronger compression Patient/caregiver will verbalize understanding of skin care regimen Date Initiated: 04/05/2015 Target Resolution Date: 08/12/2019 Goal Status: Active Interventions: Assess patient/caregiver ability to obtain necessary supplies Assess patient/caregiver ability to perform ulcer/skin care regimen upon admission and as needed Assess ulceration(s) every visit Provide education on ulcer and skin care Notes: Electronic Signature(s) Signed: 08/19/2019 5:10:17 PM By: Kela Millin Entered By: Kela Millin on 08/19/2019 10:51:59 -------------------------------------------------------------------------------- Pain Assessment Details Patient Name: Date of Service: RAMYA, VANBERGEN 08/19/2019 10:00 AM Medical Record [id number] Patient Account Number: [id number] Date of Birth/Sex: Treating RN: [birth year] (64 y.o. [name] Yacqub Baston: [name] Other Clinician: Referring Demarion Pondexter: Treating Erandi Lemma/Extender:Robson, Tawny Asal in Treatment: 349 Active Problems Location of Pain Severity and Description of Pain Patient Has Paino Yes Site Locations Pain Location: Generalized Pain, Pain in Ulcers With Dressing Change: Yes Duration of the Pain. Constant / Intermittento Intermittent Rate the pain. Current Pain Level: 4 Worst Pain Level: 6 Least Pain Level: 0 Character of Pain Describe the Pain: Aching, Tender, Other: sore Pain Management and Medication Current Pain Management: Rest: Yes Is the Current Pain Management Adequate: Adequate How does your wound impact  your activities of daily  livingo Sleep: No Bathing: No Appetite: No Relationship With Others: No Bladder Continence: No Emotions: No Bowel Continence: No Work: No Toileting: No Drive: No Dressing: No Hobbies: No Electronic Signature(s) Signed: 08/23/2019 5:59:14 PM By: Baruch Gouty RN, BSN Entered By: Baruch Gouty on 08/19/2019 10:18:47 -------------------------------------------------------------------------------- Patient/Caregiver Education Details Patient Name: Date of Service: Clum, Linsie G. 3/19/2021andnbsp10:00 AM Medical Record [id number] Patient Account Number: [id number] Date of Birth/Gender: Treating RN: 01-10-1956 (63 y.o. [name] Primary Care Physician: [name] Other Clinician: Referring Physician: Treating Physician/Extender:Robson, Tawny Asal in Treatment: [phone] Education Assessment Education Provided To: Patient Education Topics Provided Wound/Skin Impairment: Methods: Explain/Verbal Responses: State content correctly Electronic Signature(s) Signed: 08/19/2019 5:10:17 PM By: Kela Millin Entered By: Kela Millin on 08/19/2019 10:55:14 -------------------------------------------------------------------------------- Wound Assessment Details Patient Name: Date of Service: DYNEISHA, MURCHISON 08/19/2019 10:00 AM Medical Record [id number] Patient Account Number: [id number] Date of Birth/Sex: Treating RN: 1955-11-23 (64 y.o. [name] Terrace Fontanilla: [name] Other Clinician: Referring Labrenda Lasky: Treating Jakya Dovidio/Extender:Robson, Tawny Asal in Treatment: 349 Wound Status Wound Number: 3 Primary Venous Leg Ulcer Etiology: Wound Location: Left Malleolus - Medial Wound Open Wounding Event: Gradually Appeared Status: Date Acquired: 11/15/2012 Comorbid Congestive Heart Failure, Peripheral Vascular Weeks Of Treatment: 349 History: Disease, Congestive Heart Failure, End  Stage Clustered Wound: No Renal Disease, Tobacco Use, Chronic Obstructive Pulmonary Disease (COPD), Type 1 Diabetes Wound Measurements Length: (cm) 1.2 % Reduct Width: (cm) 0.9 % Reduct Depth: (cm) 0.1 Epitheli Area: (cm) 0.848 Volume: (cm) 0.085 Wound Description Classification: Full Thickness Without Exposed Support Structures Wound Flat and Intact Margin: Exudate Medium Amount: Exudate Serosanguineous Type: Exudate red, brown Color: Wound Bed Granulation Amount: Large (67-100%) Granulation Quality: Red, Pink Necrotic Amount: None Present (0%) Foul Odor After Cleansing: No Slough/Fibrino No Exposed Structure Fascia Exposed: No Fat Layer (Subcutaneous Tissue) Exposed: Yes Tendon Exposed: No Muscle Exposed: No Joint Exposed: No Bone Exposed: No ion in Area: 73% ion in Volume: 86.5% alization: Small (1-33%) Tunneling: No Undermining: No Treatment Notes Wound #3 (Left, Medial Malleolus) 1. Cleanse With Wound Cleanser Soap and water 2. Periwound Care Barrier cream TCA Ointment 3. Primary Dressing Applied Hydrofera Blue 4. Secondary Dressing Dry Gauze Kerramax/Xtrasorb 6. Support Layer Production assistant, radio Notes hydrofera classic. foam to lateral side of foot for protection. Electronic Signature(s) Signed: 08/23/2019 5:59:14 PM By: Baruch Gouty RN, BSN Entered By: Baruch Gouty on 08/19/2019 10:29:48 -------------------------------------------------------------------------------- [place] Details Patient Name: Date of Service: [name] 08/19/2019 10:00 AM Medical Record [id number] Patient Account Number: [id number] Date of Birth/Sex: Treating RN: 06-Sep-1955 (64 y.o. [name] [name] Cormier: [name] Other Clinician: Referring Ason Heslin: Treating Destini Cambre/Extender:Robson, Tawny Asal in Treatment: 349 Vital Signs Time Taken: 10:16 Temperature (F): 98.0 Height (in): 68 Pulse (bpm):  70 Source: Stated Respiratory Rate (breaths/min): 18 Weight (lbs): 127 Blood Pressure (mmHg): 112/50 Source: Stated Reference Range: 80 - 120 mg / dl Body Mass Index (BMI): 19.3 Electronic Signature(s) Signed: 08/23/2019 5:59:14 PM By: Baruch Gouty RN, BSN Entered By: Baruch Gouty on 08/19/2019 10:17:19

## 2019-08-26 ENCOUNTER — Encounter (HOSPITAL_BASED_OUTPATIENT_CLINIC_OR_DEPARTMENT_OTHER): Payer: Medicare Other | Admitting: Internal Medicine

## 2019-08-26 ENCOUNTER — Other Ambulatory Visit: Payer: Self-pay

## 2019-08-26 DIAGNOSIS — E10622 Type 1 diabetes mellitus with other skin ulcer: Secondary | ICD-10-CM | POA: Diagnosis not present

## 2019-08-27 NOTE — Progress Notes (Signed)
[name], ONTKO ([id number]) Visit Report for 08/26/2019 Arrival Information Details Patient Name: Date of Service: [name], [name] 08/26/2019 12:30 PM Medical Record [id number] Patient Account Number: [id number] Date of Birth/Sex: Treating RN: 12-Jul-1955 (64 y.o. [name] Primary Care Kabrea Seeney: [name] Other Clinician: Referring Alexes Lamarque: Treating Yuval Nolet/Extender:Robson, Tawny Asal in Treatment: 350 Visit Information History Since Last Visit Cane All ordered tests and consults were completed: No Patient Arrived: 13:08 Added or deleted any medications: No Arrival Time: Any new allergies or adverse reactions: No Accompanied By: self None Had a fall or experienced change in No Transfer Assistance: activities of daily living that may affect Patient Identification Verified: Yes risk of falls: Secondary Verification Process Completed: Yes Signs or symptoms of abuse/neglect since last No Patient Requires Transmission-Based No visito Precautions: Hospitalized since last visit: No Patient Has Alerts: No Implantable device outside of the clinic excluding No cellular tissue based products placed in the center since last visit: Has Dressing in Place as Prescribed: Yes Has Compression in Place as Prescribed: Yes Pain Present Now: No Electronic Signature(s) Signed: 08/26/2019 5:43:34 PM By: [name] RN Entered By: [name] on 08/26/2019 13:08:44 -------------------------------------------------------------------------------- Compression Therapy Details Patient Name: Date of Service: [name], [name] 08/26/2019 12:30 PM Medical Record [id number] Patient Account Number: [id number] Date of Birth/Sex: Treating RN: Feb 04, 1956 (64 y.o. [name] Primary Care Carder Yin: [name] Other Clinician: Referring Loomis Anacker: Treating [name] [name]/Extender:Robson, Tawny Asal in Treatment: 350 Compression Therapy  Performed for Wound Wound #3 Left,Medial Malleolus Assessment: Performed By: Clinician Deon Pilling, RN Compression Type: Rolena Infante Post Procedure Diagnosis Same as Pre-procedure Electronic Signature(s) Signed: 08/26/2019 5:41:24 PM By: Kela Millin Entered By: Kela Millin on 08/26/2019 13:26:55 -------------------------------------------------------------------------------- Encounter Discharge Information Details Patient Name: Date of Service: [name] 08/26/2019 12:30 PM Medical Record [id number] Patient Account Number: [id number] Date of Birth/Sex: Treating RN: [birth year] (64 y.o. [name] Primary Care Lovelee Forner: [name] Other Clinician: Referring Kayleanna Lorman: Treating Everli Rother/Extender:Robson, Tawny Asal in Treatment: 350 Encounter Discharge Information Items Discharge Condition: Stable Ambulatory Status: Cane Discharge Destination: Home Transportation: Private Auto Accompanied By: self Schedule Follow-up Appointment: Yes Clinical Summary of Care: Patient Declined Electronic Signature(s) Signed: 08/26/2019 5:34:10 PM By: Baruch Gouty RN, BSN Entered By: Baruch Gouty on 08/26/2019 13:54:38 -------------------------------------------------------------------------------- Lower Extremity Assessment Details Patient Name: Date of Service: [name], [name] 08/26/2019 12:30 PM Medical Record [id number] Patient Account Number: [id number] Date of Birth/Sex: Treating RN: 11/26/1955 (63 y.o. [name] Primary Care Masae Lukacs: [name] Other Clinician: Referring [name]: Treating Redell Nazir/Extender:Robson, [name], [name] in Treatment: 350 Edema Assessment Assessed: [Left: No] [Right: No] Edema: [Left: N] [Right: o] Calf Left: Right: Point of Measurement: 33 cm From Medial Instep 29.4 cm cm Ankle Left: Right: Point of Measurement: 12 cm From Medial Instep 20.8 cm cm Electronic  Signature(s) Signed: 08/26/2019 5:43:34 PM By: [name] RN Entered By: [name] on 08/26/2019 13:11:53 -------------------------------------------------------------------------------- Multi Wound Chart Details Patient Name: Date of Service: [name] 08/26/2019 12:30 PM Medical Record [id number] Patient Account Number: [id number] Date of Birth/Sex: Treating RN: 10/09/1955 (64 y.o. [name] Primary Care Zayonna Ayuso: [name] Other Clinician: Referring Alaiza Yau: Treating Lismary Kiehn/Extender:Robson, [name], [name] in Treatment: 350 Vital Signs Height(in): 68 Pulse(bpm): 63 Weight(lbs): 127 Blood Pressure(mmHg): 122/57 Body Mass Index(BMI): 19 Temperature(F): 98.4 Respiratory 18 Rate(breaths/min): Photos: [3:No Photos] [N/A:N/A] Wound Location: [3:Left Malleolus - Medial] [N/A:N/A] Wounding Event: [3:Gradually Appeared] [N/A:N/A] Primary Etiology: [3:Venous Leg Ulcer] [N/A:N/A] Comorbid History: [  3:Congestive Heart Failure, Peripheral Vascular Disease, Congestive Heart Failure, End Stage Renal Disease, Tobacco Use, Chronic Obstructive Pulmonary Disease (COPD), Type 1 Diabetes] [N/A:N/A] Date Acquired: [3:11/15/2012] [N/A:N/A] Weeks of Treatment: [3:350] [N/A:N/A] Wound Status: [3:Open] [N/A:N/A] Measurements L x W x D 1.1x1.1x0.1 [N/A:N/A] (cm) Area (cm) : [3:0.95] [N/A:N/A] Volume (cm) : [3:0.095] [N/A:N/A] % Reduction in Area: [3:69.80%] [N/A:N/A] % Reduction in Volume: 84.90% [N/A:N/A] Classification: [3:Full Thickness Without Exposed Support Structures] [N/A:N/A] Exudate Amount: [3:Medium] [N/A:N/A] Exudate Type: [3:Serosanguineous] [N/A:N/A] Exudate Color: [3:red, brown] [N/A:N/A] Wound Margin: [3:Flat and Intact] [N/A:N/A] Granulation Amount: [3:Large (67-100%)] [N/A:N/A] Granulation Quality: [3:Red, Pink] [N/A:N/A] Necrotic Amount: [3:None Present (0%)] [N/A:N/A] Exposed Structures: [3:Fat Layer (Subcutaneous N/A  Tissue) Exposed: Yes Fascia: No Tendon: No Muscle: No Joint: No Bone: No] Epithelialization: [3:Small (1-33%) Compression Therapy] [N/A:N/A N/A] Treatment Notes Electronic Signature(s) Signed: 08/26/2019 5:41:24 PM By: Kela Millin Signed: 08/27/2019 8:02:09 AM By: Linton Ham MD Entered By: Linton Ham on 08/26/2019 13:40:01 -------------------------------------------------------------------------------- Multi-Disciplinary Care Plan Details Patient Name: Date of Service: [name] 08/26/2019 12:30 PM Medical Record [id number] Patient Account Number: [id number] Date of Birth/Sex: Treating RN: 04/09/1956 (64 y.o. [name] Primary Care [place]: [name] Other Clinician: Referring Ugochukwu Chichester: Treating Joyel Chenette/Extender:Robson, [name], [name] in Treatment: 350 Active Inactive Abuse / Safety / Falls / Self Care Management Nursing Diagnoses: History of Falls Potential for falls Goals: Patient will not experience any injury related to falls Date Initiated: 08/05/2019 Target Resolution Date: 09/02/2019 Goal Status: Active Patient/caregiver will verbalize/demonstrate measures taken to prevent injury and/or falls Date Initiated: 08/05/2019 Target Resolution Date: 09/02/2019 Goal Status: Active Interventions: Assess fall risk on admission and as needed Assess impairment of mobility on admission and as needed per policy Notes: Venous Leg Ulcer Nursing Diagnoses: Actual venous Insuffiency (use after diagnosis is confirmed) Goals: Patient will maintain optimal edema control Date Initiated: 09/04/2017 Target Resolution Date: 09/16/2019 Goal Status: Active Patient/caregiver will verbalize understanding of disease process and disease management Date Initiated: 08/03/2014 Date Inactivated: 08/28/2017 Target Resolution Date: 08/29/2017 Goal Status: Met Interventions: Compression as ordered Treatment Activities: Therapeutic compression applied :  09/21/2015 Notes: Wound/Skin Impairment Nursing Diagnoses: Impaired tissue integrity Knowledge deficit related to ulceration/compromised skin integrity Goals: Patient will have a decrease in wound volume by X% from date: (specify in notes) Date Initiated: 04/05/2015 Date Inactivated: 02/12/2017 Target Resolution Date: 09/01/2016 Unmet Reason: chronic, Goal Status: Unmet unable to tolerate stronger compression Patient/caregiver will verbalize understanding of skin care regimen Date Initiated: 04/05/2015 Target Resolution Date: 09/16/2019 Goal Status: Active Interventions: Assess patient/caregiver ability to obtain necessary supplies Assess patient/caregiver ability to perform ulcer/skin care regimen upon admission and as needed Assess ulceration(s) every visit Provide education on ulcer and skin care Notes: Electronic Signature(s) Signed: 08/26/2019 5:41:24 PM By: Kela Millin Entered By: Kela Millin on 08/26/2019 12:54:01 -------------------------------------------------------------------------------- Pain Assessment Details Patient Name: Date of Service: [name], [name] 08/26/2019 12:30 PM Medical Record [id number] Patient Account Number: [id number] Date of Birth/Sex: Treating RN: 11/13/1955 (64 y.o. [name] Primary Care Meher Kucinski: [name] Other Clinician: Referring Otilia Kareem: Treating Cindel Daugherty/Extender:Robson, [name], [name] in Treatment: 350 Active Problems Location of Pain Severity and Description of Pain Patient Has Paino No Site Locations Pain Management and Medication Current Pain Management: Electronic Signature(s) Signed: 08/26/2019 5:43:34 PM By: [name] RN Entered By: [name] on 08/26/2019 13:11:27 -------------------------------------------------------------------------------- Patient/Caregiver Education Details Patient Name: Date of Service: [name] 3/26/2021andnbsp12:30 PM Medical Record  [id number] Patient Account Number: [id number] Date of Birth/Gender: Treating RN: [birth year] (63  y.o. [name] Primary Care Physician: [name] Other Clinician: Referring Physician: Treating Physician/Extender:Robson, Tawny Asal in Treatment: 34 Education Assessment Education Provided To: Patient Education Topics Provided Wound/Skin Impairment: Methods: Explain/Verbal Responses: State content correctly Electronic Signature(s) Signed: 08/26/2019 5:41:24 PM By: Kela Millin Entered By: Kela Millin on 08/26/2019 12:54:34 -------------------------------------------------------------------------------- Wound Assessment Details Patient Name: Date of Service: [name], [name] 08/26/2019 12:30 PM Medical Record [id number] Patient Account Number: [id number] Date of Birth/Sex: Treating RN: 11-26-1955 (64 y.o. [name] Primary Care Steen Bisig: [name] Other Clinician: Referring Adelene Polivka: Treating Arnecia Ector/Extender:Robson, [name], [name] in Treatment: 350 Wound Status Wound Number: 3 Primary Venous Leg Ulcer Etiology: Wound Location: Left Malleolus - Medial Wound Open Wounding Event: Gradually Appeared Status: Date Acquired: 11/15/2012 Comorbid Congestive Heart Failure, Peripheral Vascular Weeks Of Treatment: 350 History: Disease, Congestive Heart Failure, End Stage Clustered Wound: No Renal Disease, Tobacco Use, Chronic Obstructive Pulmonary Disease (COPD), Type 1 Diabetes Wound Measurements Length: (cm) 1.1 % Reduc Width: (cm) 1.1 % Reduc Depth: (cm) 0.1 Epithel Area: (cm) 0.95 Tunnel Volume: (cm) 0.095 Underm Wound Description Classification: Full Thickness Without Exposed Support Foul O Structures Slough Wound Flat and Intact Margin: Exudate Medium Amount: Exudate Serosanguineous Type: Exudate red, brown Color: Wound Bed Granulation Amount: Large (67-100%) Granulation Quality:  Red, Pink Fascia Expo Necrotic Amount: None Present (0%) Fat Layer ( Tendon Expo Muscle Expo Joint Expos Bone Expose dor After Cleansing: No /Fibrino No Exposed Structure sed: No Subcutaneous Tissue) Exposed: Yes sed: No sed: No ed: No d: No tion in Area: 69.8% tion in Volume: 84.9% ialization: Small (1-33%) ing: No ining: No Treatment Notes Wound #3 (Left, Medial Malleolus) 2. Periwound Care TCA Ointment 3. Primary Dressing Applied Hydrofera Blue 4. Secondary Dressing Dry Gauze 6. Support Layer Production assistant, radio Notes hydrofera classic. Electronic Signature(s) Signed: 08/26/2019 5:43:34 PM By: [name] RN Entered By: [name] on 08/26/2019 13:14:28 -------------------------------------------------------------------------------- Vitals Details Patient Name: Date of Service: [name] 08/26/2019 12:30 PM Medical Record [id number] Patient Account Number: [id number] Date of Birth/Sex: Treating RN: Jan 28, 1956 (63 y.o. [name] Primary Care Marceil Welp: [name] Other Clinician: Referring Floria Brandau: Treating Kearia Yin/Extender:Robson, [name], [name] in Treatment: 350 Vital Signs Time Taken: 13:09 Temperature (F): 98.4 Height (in): 68 Pulse (bpm): 63 Weight (lbs): 127 Respiratory Rate (breaths/min): 18 Body Mass Index (BMI): 19.3 Blood Pressure (mmHg): 122/57 Reference Range: 80 - 120 mg / dl Electronic Signature(s) Signed: 08/26/2019 5:43:34 PM By: [name] RN Entered By: [name] on 08/26/2019 13:10:54

## 2019-08-27 NOTE — Progress Notes (Signed)
[name], [name] ([id number]) Visit Report for 08/26/2019 HPI Details Patient Name: Date of Service: [name], [name] 08/26/2019 12:30 PM Medical Record [id number] Patient Account Number: [id number] Date of Birth/Sex: Treating RN: 23-Nov-1955 (64 y.o. [name] Primary Care Provider: [name] Other Clinician: Referring Provider: Treating Provider/Extender:Severina Sykora, Tawny Asal in Treatment: 350 History of Present Illness HPI Description: the remaining wound is over the left medial ankle. Similar wound over the right medial ankle healed largely with use of Apligraf. Most recently we have been using Hydrofera Blue over this wound with considerable improvement. The patient has been extensively worked up in the past for her venous insufficiency and she is not a candidate for antireflux surgery although I have none of the details available currently. 08/24/14; considerable improvement today. About 50% of this wound areas now epithelialized. The base of the wound appears to be healthier granulation.as opposed to last week when she had deteriorated a considerable improvement 08/17/14; unfortunately the wound has regressed somewhat. The areas of epithelialization from the superior aspect are not nearly as healthy as they were last week. The patient thinks her Hydrofera Blue slipped. 09/07/14; unfortunately the area has markedly regressed in the 2 weeks since I've seen this. There is an odor surrounding erythema. The healthy granulation tissue that we had at the base of the wound now is a dusky color. The nurse reports green drainage 09/14/14; the area looks somewhat better than last week. There is less erythema and less drainage. The culture I did did not show any growth. Nevertheless I think it is better to continue the Cipro and doxycycline for a further week. The remaining wound area was debridement. 09/21/14. Wound did not require debridement last week. Still  less erythema and less drainage. She can complete her antibiotics. The areas of epithelialization in the superior aspect of the wound do not look as healthy as they did some weeks ago 10/05/14 continued improvement in the condition of this wound. There is advancing epithelialization. Less aggressive debridement required 10/19/14 continued improvement in the condition and volume of this wound. Less aggressive debridement to the inferior part of this to remove surface slough and fibrinous eschar 11/02/14 no debridement is required. The surface granulation appears healthy although some of her islands of epithelialization seem to have regressed. No evidence of infection 11/16/14; lites surface debridement done of surface eschar. The wound does not look to be unhealthy. No evidence of infection. Unfortunately the patient has had podiatry issues in the right foot and for some reason has redeveloped small surface ulcerations in the medial right ankle. Her original presentation involved wounds in this area 11/23/14 no debridement. The area on the right ankle has enlarged. The left ankle wound appears stable in terms of the surface although there is periwound inflammation. There has been regression in the amount of new skin 11/30/14 no debridement. Both wound areas appear healthy. There was no evidence of infection. The the new area on the right medial ankle has enlarged although that both the surfaces appear to be stable. 12/07/14; Debridement of the right medial ankle wound. No no debridement was done on the left. 12/14/14 no major change in and now bilateral medial ankle wounds. Both of these are very painful but the no overt evidence of infection. She has had previous venous ablation 12/21/14; patient states that her right medial ankle wound is considerably more painful last week than usual. Her left is also somewhat painful. She could not tolerate debridement. The right medial ankle  wound has fibrinous  surface eschar 12/28/14 this is a patient with severe bilateral venous insufficiency ulcers. For a considerable period of time we actually had the one on the right medial ankle healed however this recently opened up again in June. The left medial ankle wound has been a refractory area with some absent flows. We had some success with Hydrofera Blue on this area and it literally closed by 50% however it is recently opened up Foley. Both of these were debridement today of surface eschar. She tolerates this poorly 01/25/15: No change in the status of this. Thick adherent escar. Very poor tolerance of any attempt at debridement. I had healed the right medial malleolus wound for a considerable amount of time and had the left one down to about 50% of the volume although this is totally regressed over the last 48 weeks. Further the right leg has reopened. she is trying to make a appointment with pain and vascular, previous ablations with Dr. Aleda Grana. I do not believe there is an arterial insufficiency issue here 02/01/15 the status of the adherent eschar bilaterally is actually improved. No debridement was done. She did not manage to get vascular studies done 02/08/15 continued debridement of the area was done today. The slough is less adherent and comes off with less pressure. There is no surrounding infection peripheral pulses are intact 02/15/15 selective debridement with a disposable curette. Again the slough is less adherent and comes off with less difficulty. No surrounding infection peripheral pulses are intact. 02/22/15 selective debridement of the right medial ankle wound. Slough comes off with less difficulty. No obvious surrounding infection peripheral pulses are intact I did not debridement the one on the left. Both of these are stable to improved 03/01/15 selective debridement of both wound areas using a curette to. Adherent slough cup soft with less difficulty. No obvious surrounding  infection. The patient tells me that 2 days ago she noted a rash above the right leg wrap. She did not have this on her lower legs when she change this over she arrives with widespread left greater than right almost folliculitis-looking rash which is extremely pruritic. I don't see anything to culture here. There is no rash on the rest of her body. She feels well systemically. 03/08/15; selective debridement of both wounds using a curette. Base of this does not look unhealthy. She had limegreen drainage coming out of the left leg wound and describes a lot of drainage. The rash on her left leg looks improved to. No cultures were done. 03/22/15; patient was not here last week. Basal wounds does not look healthy and there is no surrounding erythema. No drainage. There is still a rash on the left leg that almost looks vasculitic however it is clearly limited to the top of where the wrap would be. 04/05/15; on the right required a surgical debridement of surface eschar and necrotic subcutaneous tissue. I did not debridement the area on the left. These continue to be large open wounds that are not changing that much. We were successful at one point in healing the area on the right, and at the same time the area on the left was roughly half the size of current measurements. I think a lot of the deterioration has to do with the prolonged time the patient is on her feet at work 04/19/15 I attempted-like surface debridement bilaterally she does not tolerate this. She tells me that she was in allergic care yesterday with extreme pain over her left lateral  malleolus/ankle and was told that she has an "sprain" 05/03/15; large bilateral venous insufficiency wounds over the medial malleolus/medial aspect of her ankles. She complains of copious amounts of drainage and his usual large amounts of pain. There is some increasing erythema around the wound on the right extending into the medial aspect of her foot to.  historically she came in with these wounds the right one healed and the left one came down to roughly half its current size however the right one is reopened and the left is expanded. 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 05/10/15 large bilateral venous insufficiency wounds. There is less adherence surface left however the surface culture that I did last week grew pseudomonas therefore bilateral selective debridement score necessary. There is surrounding erythema. The patient describes severe bilateral drainage and a lot of pain in the left ankle. Apparently her podiatrist was were ready to do a cortisone shot 05/17/15; the patient complains of pain and again copious amounts of drainage. 05/24/15; we used Iodo flex last week. Patient notes considerable improvement in wound drainage. Only needed to change this once. 05/31/15; we continued Iodoflex; the base of these large wounds bilaterally is not too bad but there is probably likely a significant bioburden here. I would like to debridement just doesn't tolerate it. 06/06/14 I would like to continue the Iodoflex although she still hasn't managed to obtain supplies. She has bilateral medial malleoli or large wounds which are mostly superficial. Both of them are covered circumferentially with some nonviable fibrinous slough although she tolerates debridement very poorly. She apparently has an appointment for an ablation on the right leg by interventional radiology. 06/14/15; the patient arrives with the wounds and static condition. We attempted a debridement although she does not do well with this secondary to pain. I 07/05/15; wounds are not much smaller however there appears to be a cleaner granulating base. The left has tight fibrinous slough greater than the right. Debridement is tolerated poorly due to pain. Iodoflex is done more for these wounds in any of the multitude of different dressings I  have tried on the left 1 and then subsequently the right. 07/12/15; no change in the condition of this wound. I am able to do an aggressive debridement on the right but not the left. She simply cannot tolerate it. We have been using Iodoflex which helps somewhat. It is worthwhile remembering that at one point we healed the right medial ankle wound and the left was about 25% of the current circumference. We have suggested returning to vascular surgery for review of possible further ablations for one reason or another she has not been able to do this. 07/26/15 no major change in the condition of either wound on her medial ankle. I did not attempt to debridement of these. She has been aggressively scrubbing these while she is in the shower at home. She has her supply of Iodoflex which seems to have done more for these wounds then anything I have put on recently. 08/09/15 wound area appears larger although not verified by measurements. Using Iodoflex 09/05/2015 -- she was here for avisit today but had significant problems with the wound and I was asked to see her for a physician opinion. I have summarize that this lady has had surgery on her left lower extremity about 10 years ago where the possible veins stripping was done. She has had an opinion from interventional radiology  around November 2016 where no further sclerotherapy was ordered. The patient works 12 hours a day and stands on a concrete floor with work boots and is unable to get the proper compression she requires and cannot elevate her limbs appropriately at any given time. She has recently grown Pseudomonas from her wound culture but has not started her ciprofloxacin which was called in for her. 09/13/15 this continues to be a difficult situation for this patient. At one point I had this wound down to a 1.5 x 1.5" wound on her left leg. This is deteriorated and the right leg has reopened. She now has substantial wounds on her medial calcaneus,  malleoli and into her lower leg. One on the left has surface eschar but these are far too painful for me to debridement here. She has a vascular surgery appointment next week to see if anything can be done to help here. I think she has had previous ablations several years ago at [facility]. She has no major edema. She tells me that she did not get product last time [facility] and went for several days without it. She continues to work in work boots 12 hours a day. She cannot get compression/4-layer under her work boots. 09/20/15 no major change. Periwound edema control was not very good. Her point with pain and vascular is next Wednesday the 25th 09/28/15; the patient is seen vascular surgery and is apparently scheduled for repeat duplex ultrasounds of her bilateral lower legs next week. 10/05/15; the patient was seen by Dr. Doren Custard of vascular surgery. He feels that she should have arterial insufficiency excluded as cause/contributed to her nonhealing stage she is therefore booked for an arteriogram. She has apparently monophasic signals in the dorsalis pedis pulses. She also of course has known severe chronic venous insufficiency with previous procedures as noted previously. I had another long discussion with the patient today about her continuing to work 12 hour shifts. I've written her out for 2 months area had concerns about this as her work location is currently undergoing significant turmoil and this may lead to her termination. She is aware of this however I agree with her that she simply cannot continue to stand for 12 hours multiple days a week with the substantial wound areas she has. 10/19/15; the Dr. Doren Custard appointment was largely for an arteriogram which was normal. She does not have an arterial issue. He didn't make a comment about her chronic venous insufficiency for which she has had previous ablations. Presumably it was not felt that anything additional could be done. The patient is  now out of work as I prescribed 2 weeks ago. Her wounds look somewhat less aggravated presumably because of this. I felt I would give debridement another try today 10/25/15; no major change in this patient's wounds. We are struggling to get her product that she can afford into her own home through her insurance. 11/01/15; no major change in the patient's wounds. I have been using silver alginate as the most affordable product. I spoke to Dr. Marla Roe last week with her requested take her to the OR for surgical debridement and placement of ACEL. Dr. Marla Roe told me that she would be willing to do this however [facility] will not cover this, fortunately the patient has [place] healthcare of some variant 11/08/15; no major change in the patient's wounds. She has been completely nonviable surface that this but is in too much pain with any attempted debridement are clinic. I have  arranged for her to see Dr. Marla Roe ham of plastic surgery and this appointment is on Monday. I am hopeful that they will take her to the OR for debridement, possible ACEL ultimately possible skin graft 11/22/15 no major change in the patient's wounds over her bilateral medial calcaneus medial malleolus into the lower legs. Surface on these does not look too bad however on the left there is surrounding erythema and tenderness. This may be cellulitis or could him sleepy tinea. 11/29/15; no major changes in the patient's wounds over her bilateral medial malleolus. There is no infection here and I don't think any additional antibiotics are necessary. There is now plan to move forward. She sees Dr. Marla Roe in a week's time for preparation for operative debridement and ACEL placement I believe on 7/12. She then has a follow-up appointment with Dr. Marla Roe on 7/21 12/28/15; the patient returns today having been taken [place] by Dr. Marla Roe 12/12/15 she underwent debridement, intraoperative cultures [which were  negative]. She had placement of a wound VAC. Parent really ACEL was not available to be placed. The wound VAC foam apparently adhered to the wound since then she's been using silver alginate, Xeroform under Ace wraps. She still says there is a lot of drainage and a lot of pain 01/31/16; this is a patient I see monthly. I had referred her to Dr. Marla Roe him of plastic surgery for large wounds on her bilateral medial ankles. She has been to the OR twice once in early July and once in early August. She tells me over the last 3 weeks she has been using the wound VAC with ACEL underneath it. On the right we've simply been using silver alginate. Under Kerlix Coban wraps. 02/28/16; this is a patient I'm currently seeing monthly. She is gone on to have a skin graft over her large venous insufficiency ulcer on the left medial ankle. This was done by Dr. Marla Roe him. The patient is a bit perturbed about why she didn't have one on her right medial ankle wound. She has been using silver alginate to this. 03/06/16; I received a phone call from her plastic surgery Dr. Marla Roe. She expressed some concern about the viability of the skin graft she did on the left medial ankle wound. Asked me to place Endoform on this. She told me she is not planning to do a subsequent skin graft on the right as the left one did not take very well. I had placed Hydrofera Blue on the right 03/13/16; continue to have a reasonably healthy wound on the right medial ankle. Down to 3 mm in terms of size. There is epithelialization here. The area on the left medial ankle is her skin graft site. I suppose the last week this looks somewhat better. She has an open area inferiorly however in the center there appears to be some viable tissue. There is a lot of surface callus and eschar that will eventually need to come off however none of this looked to be infected. Patient states that the is able to keep the dressing on for several days  which is an improvement. 03/20/16 no major change in the circumference of either wound however on the left side the patient was at Dr. Eusebio Friendly office and they did a debridement of left wound. 50% of the wound seems to be epithelialized. I been using Endoform on the left Hydrofera Blue in the right 03/27/16; she arrives today with her wound is not looking as healthy as they did  last week. The area on the right clearly has an adherent surface to this a very similar surface on the left. Unfortunately for this patient this is all too familiar problem. Clearly the Endoform is not working and will need to change that today that has some potential to help this surface. She does not tolerate debridement in this clinic very well. She is changing the dressing wants 04/03/16; patient arrives with the wounds looking somewhat better especially on the right. Dr. Migdalia Dk change the dressing to silver alginate when she saw her on Monday and also sold her some compression socks. The usefulness of the latter is really not clear and woman with severely draining wounds. 04/10/16; the patient is doing a bit of an experiment wearing the compression stockings that Dr. Migdalia Dk provided her to her left leg and the out of legs based dressings that we provided to the right. 05/01/16; the patient is continuing to wear compression stockings Dr. Migdalia Dk provided her on the left that are apparently silver impregnated. She has been using Iodoflex to the right leg wound. Still a moderate amount of drainage, when she leaves here the wraps only last for 4 days. She has to change the stocking on the left leg every night 05/15/16; she is now using compression stockings bilaterally provided by Dr. Marla Roe. She is wearing a nonadherent layer over the wounds so really I don't think there is anything specific being done to this now. She has some reduction on the left wound. The right is stable. I think all healing here is being done  without a specific dressing 06/09/16; patient arrives here today with not much change in the wound certainly in diameter to large circular wounds over the medial aspect of her ankle bilaterally. Under the light of these services are certainly not viable for healing. There is no evidence of surrounding infection. She is wearing compression stockings with some sort of silver impregnation as prescribed by Dr. Marla Roe. She has a follow-up with her tomorrow. 06/30/16; no major change in the size or condition of her wounds. These are still probably covered with a nonviable surface. She is using only her purchase stockings. She did see Dr. Marla Roe who seemed to want to apply Dakin's solution to this I'm not extreme short what value this would be. I would suggest Iodoflex which she still has at home. 07/28/16; I follow Mrs. Kimberley episodically along with Dr. Marla Roe. 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 She has been applying a topical collagen ointment to both wounds with Adaptic. I don't think Dr. Marla Roe is planning to take her back to the OR. 08/19/16; I follow Mrs. Jeneen Rinks on a monthly basis along with Dr. Marla Roe of plastic surgery. 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 I been following her for a number of years. At one point I was able to get the right medial malleolus wound to heal and had the left medial malleolus down to about half its current size however and I had to send her to plastic surgery for an operative debridement. Since then things have been stable to slightly improve the area on the right is slightly better one in the left about the same although there is much less adherent surface than I'm used to with this patient. She is using some form of liquid collagen gel that Dr. Marla Roe provided a Kerlix cover with the patient's own  pressure stockings. She tells me that she has extreme pain in both ankles and along the lateral aspect of both feet. She has been unable to work for some period of time. She is telling me she is retiring at the beginning of April. She sees Dr. Doran Durand of orthopedics next week 09/22/16; patient has not seen Dr. Marla Roe since the last time she is here. I'm not really sure what she is using to the wounds other than bits and pieces of think she had left over including most recently Hydrofera Blue. She is using juxtalite stockings. She is having difficulty with her husband's recent illness "stroke". She is having to transport him to various doctors appointments. Dr. Marla Roe left her the option of a repeat debridement with ACEL however she has not been able to get the time to follow-up on this. She continues to have a fair amount of drainage out of these wounds with certainly precludes leaving dressings on all week 10/13/16; patient has not seen Dr. Marla Roe since she was last in our clinic. I'm not really sure what she is doing with the wounds, we did try to get her [facility] and I think she is actually using this most of the time. Because of drainage she states she has to change this every second day although this is an improvement from what she used to do. She went to see Dr. Doran Durand who did not think she had a muscular issue with regards to her feet, he referred her to a neurologist and I think the appointment is sometime in June. I changed her back to Iodoflex which she has used in the past but not recently. 11/03/16; the patient has been using Iodoflex although she ran out of this. Still claims that there is a lot of drainage although the wound does not look like this. No surrounding erythema. She has not been back to see Dr. Marla Roe 11/24/16; the patient has been using Iodoflex again but she ran out of it 2 or 3 days ago. There is no major change in the condition of either one of these  wounds in fact they are larger and covered in a thick adherent surface slough/nonviable tissue especially on the left. She does not tolerate mechanical debridement in our clinic. Going back to see Dr. Marla Roe of plastic surgery for an operative debridement would seem reasonable. 12/15/16; the patient has not been back to see Dr. Marla Roe. She is been dealing with a series of illnesses and her husband which of monopolized her time. She is been using Sorbact which we largely supplied. She states the drainage is bad enough that it maximum she can go 2-3 days without changing the dressing 01/12/2017 -- the patient has not been back for about 4 weeks and has not seen Dr. Marla Roe not does she have any appointment pending. 01/23/17; patient has not seen Dr. Marla Roe even though I suggested this previously. She is using Santyl that was suggested last week by Dr. Con Memos this Cost her $16 through her insurance which is indeed surprising 02/12/17; continuing Santyl and the patient is changing this daily. A lot of drainage. She has not been back to see plastic surgery she is using an Ace wrap. Our intake nurse suggested wrap around stockings which would make a good reasonable alternative 02/26/17; patient is been using Santyl and changing this daily due to drainage. She has not been to see plastic surgery she uses in April Ace wrap to control the edema. She did obtain extremitease stockings  but stated that the edema in her leg was to big for these 03/20/17; patient is using Santyl and Anasept. Surfaces looked better today the area on the right is actually measuring a little smaller. She has states she has a lot of pain in her feet and ankles and is asking for a consult to pain control which I'll try to help her with through our case manager. 04/10/17; the patient arrives with better-looking wound surfaces and is slightly smaller wound on the left she is using a combination of Santyl and Anasept. She has  an appointment or at least as started in the pain control center associated with [facility] 05/14/17; this is a patient who I followed for a prolonged period of time. 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 At one point I had this down to a much smaller wound on the left however these reopened and we've never been able to get these to heal. She has been using Santyl and Anasept gel although 2 weeks ago she ran out of the Anasept gel. She has a stable appearance of the wound. She is going to the wound care clinic at [facility]. They wanted do a nerve block/spinal block although she tells me she is reluctant to go forward with that. 05/21/17; this is a patient I have followed for many years. 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 Chronic pain and deformity in her ankles as well. She is been to see plastic surgery as well as orthopedics. Using PolyMem AG most recently/Kerramax/ABDs and 2 layer compression. She has managed to keep this on and she is coming in for a nurse check to change the dressing on Tuesdays, we see her on Fridays 06/05/17; really quite a good looking surface and the area especially on the right medial has contracted in terms of dimensions. Well granulated healthy-looking tissue on both sides. Even with an open curet there is nothing that even feels abnormal here. This is as good as I've seen this in quite some time. We have been using PolyMem AG and bringing her in for a nurse check 06/12/17; really quite good surface on both of these wounds. The right medial has contracted a bit left is not. We've been using PolyMem and AG and she is coming in for a nurse visit 06/19/17; we have been using PolyMem AG and bringing her in for a nurse check. Dimensions of her wounds are not better but the surfaces looked better bilaterally. She complained of bleeding last night and the left wound and increasing pain bilaterally. She  states her wound pain is more neuropathic than just the wounds. There was some suggestion that this was radicular from her pain management doctor in talking to her it is really difficult to sort this out. 06/26/17; using PolyMem and AG and bringing her in for a nurse check as All of this and reasonably stable condition. Certainly not improved. The dimensions on the lateral part of the right leg look better but not really measuring better. The medial aspect on the left is about the same. 07/03/16; we have been using PolyMen AG and bringing her in for a nurse check to change the dressings as the wounds have drainage which precludes once weekly changing. We are using all secondary absorptive dressings.our intake nurse is brought up the idea of using a wound VAC/snap VAC on the wound to help with the drainage to see if this would result in some contraction. This is  not a bad idea. The area on the right medial is actually looking smaller. Both wounds have a reasonable-looking surface. There is no evidence of cellulitis. The edema is well controlled 07/10/17; the patient was denied for a snap VAC by her insurance. The major issue with these wounds continues to be drainage. We are using wicked PolyMem AG and she is coming in for a nurse visit to change this. The wounds are stable to slightly improved. The surface looks vibrant and the area on the right certainly has shrunk in size but very slowly 07/17/17; the patient still has large wounds on her bilateral medial malleoli. Surface of both of these wounds looks better. The dimensions seem to come and go but no consistent improvement. There is no epithelialization. We do not have options for advanced treatment products due to insurance issues. They did not approve of the wound VAC to help control the drainage. More recently we've been using PolyMem and AG wicked to allow drainage through. We have been bringing her in for a nurse visit to change this. We do not  have a lot of options for wound care products and the home again due to insurance issues 07/24/17; the patient's wound actually looks somewhat better today. No drainage measurements are smaller still healthy-looking surface. We used silver collagen under PolyMen started last week. We have been bringing her in for a dressing change 07/31/17; patient's wound surface continued to look better and I think there is visible change in the dimensions of the wound on the right. Rims of epithelialization. We have been using silver collagen under PolyMen and bringing her in for a dressing change. There appears to be less drainage although she is still in need of the dressing change 08/07/17. Patient's wound surface continues to look better on both sides and the area on the right is definitely smaller. We have been using silver collagen and PolyMen. She feels that the drainage has been it has been better. I asked her about her vascular status. She went to see Dr. Aleda Grana at [facility] and had some form of ablation. I don't have much detail on this. I haven't my notes from 2016 that she was not a candidate for any further ablation but I don't have any more information on this. We had referred her to vein and vascular I don't think she ever went. He does not have a history of PAD although I don't have any information on this either. We don't even have ABIs in our record 08/14/17; we've been using silver collagen and PolyMen cover. And putting the patient and compression. She we are bringing her in as a nurse visit to change this because ofarge amount of drainage. We didn't the ABIs in clinic today since they had been done in many moons 1.2 bilaterally. She has been to see vein and vascular however this was at [facility] and she had ablation although I really don't have any information on this all seemed biking get a report. She is also been operatively debrided by plastic surgery and had a cell placed  probably 8-12 months ago. This didn't have a major effect. We've been making some gains with current dressings 08/19/17-She is here in follow-up evaluation for bilateral medial malleoli ulcers. She continues to tolerate debridement very poorly. We will continue with recently changed topical treatment; if no significant improvement may consider switching to Iodosorb/Iodoflex. 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 She has been using silver  collagen and PolyMem. I believe she has been used and tried on Iodoflex before. 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 These have been very refractory over time. We have been using silver collagen and PolyMen. She is coming in once a week for a doctor's and once a week for nurse visits. We are actually making some progress 09/18/17; the patient's wounds are smaller especially on the right medial. She arrives today to upset to consider even washing these off with Anasept which I think is been part of the reason this is been closing. We've been using collagen covered in PolyMen otherwise. It is noted that she has a small area of folliculitis on the right medial calf that. As we are wrapping her legs I'll give her a short course of doxycycline to make sure this doesn't amount to anything. She is a long list of complaints today including imbalance, shortness of breath on exertion, inversion of her left ankle. With regards to the latter complaints she is been to see orthopedics and they offered her a tendon release surgery I believe but wanted her wounds to be closed first. I have recommended she go see her primary physician with regards to everything else. 09/25/17; patient's wounds are about the same size. We have made  some progress bilaterally although not in recent weeks. She will not allow me T wash these wounds with Anasept even if she is doing her cell. Wheeze we've been using collagen covered in PolyMen. Last week she had a small area of folliculitis this is now opened into a small wound. She completed 5 days of trimethoprim sulfamethoxazole 10/02/17; unfortunately the area on her left medial ankle is worse with a larger wound area towards the Achilles. The patient complains of a lot of pain. She will not allow debridement although visually I don't think there is anything to debridement in any case. We have been using silver collagen and PolyMen for several months now. Initially we are making some progress although I'm not really seeing that today. We will move back to [facility]. His admittedly this is a bit of a repeat however I'm hoping that his situation is different now. The patient tells me she had her leg on the left give out on her yesterday this is process some pain. 10/09/17; the patient is seen twice a week largely because of drainage issues coming out of the chronic medial bimalleolar wounds that are chronic. Last week the dimensions of the one on the left looks a little larger I changed her to [facility]. She comes in today with a history of terrible pain in the bilateral wound areas. She will not allow debridement. She will not even allow a tissue culture. There is no surrounding erythema no no evidence of cellulitis. We have been putting her Kerlix Coban man. She will not allow more aggressive compression as there was a suggestion to put her in 3 layer wraps. 10/16/17; large wounds on her bilateral medial malleoli. These are chronic. Not much change from last week. The surface looks have healthy but absolutely no epithelialization. A lot of pain little less so of drainage. She will not allow debridement or even washing these off in the vigorous fashion with Anasept. 10/23/17; large wounds  on her bilateral malleoli which are chronic. Some improvement in terms  of size perhaps on the right since last time I saw these. She states that after we increased the 3 layer compression there was some bleeding, when she came in for a nurse visit she did not want 3 layer compression put back on about our nurse managed to convince her. She has known chronic venous visit issues and I'm hoping to get her to tolerate the 3 layer compression. using Hydrofera Blue 10/30/17; absolutely no change in the condition of either wound although we've had some improvement in dimensions on the right.. Attempted to put her in 3 layer compression she didn't tolerated she is back in 2 layer compression. We've been using Hydrofera Blue We looked over her past records. She had venous reflux studies in November 2016. There was no evidence of deep venous reflux on the right. Superficial vein did not show the greater saphenous vein at think this is been previously ablated the small saphenous vein was within normal limits. The left deep venous system showed no DVT the vessels were positive for deep venous reflux in the posterior tibial veins at the ankle. The greater saphenous vein was surgically absent small saphenous vein was within normal limits. She went to vein and vascular at [facility]. I believe she had an ablation on the left greater saphenous vein. I'll update her reflux studies perhaps ever reviewed by vein and vascular. We've made absolutely no progress in these wounds. Will also try to read and TheraSkins through her insurance 11/06/17; W the patient apparently has a 2 week follow-up with vein and vascular I like him to review the whole issue with regards to her previous vascular workup by Dr. Aleda Grana. We've really made no progress on these wounds in many months. She arrives today with less viable looking surface on the left medial ankle wound. This was apparently looking about the same on Tuesday when  she was here for nurse visit. 11/13/17; deep tissue culture I did last time of the left lower leg showed multiple organisms without any predominating. In particular no Staphylococcus or group A strep were isolated. We sent her for venous reflux studies. She's had a previous left greater saphenous vein stripping and I think sclerotherapy of the right greater saphenous vein. She didn't really look at the lesser saphenous vein this both wounds are on the medial aspect. She has reflux in the common femoral vein and popliteal vein and an accessory vein on the right and the common femoral vein and popliteal vein on the left. I'm going to have her go to see vein and vascular just the look over things and see if anything else beside aggressive compression is indicated here. We have not been able to make any progress on these wounds in spite of the fact that the surface of the wounds is never look too bad. 11/20/17; no major change in the condition of the wounds. Patient reports a large amount of drainage. She has a lot of complaints of pain although enlisting her today I wonder if some of this at least his neuropathic rather than secondary to her wounds. She has an appointment with vein and vascular on 12/30/17. The refractory nature of these wounds in my mind at least need vein and vascular to look over the wounds the recent reflux studies we did and her history to see if anything further can be done here. I also note her gait is deteriorated quite a bit. 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 She has  a bilateral Trendelenburg gait. I wonder if this is neuropathic or perhaps multilevel radicular. 11/27/17; her wounds actually looks slightly better. Healthy-looking granulation tissue a scant amount of epithelialization. [place] healthcare will not pay for [employer]. They will play for tri layer Oasis and Dermagraft. This is not a diabetic ulcer. We'll try for the tri layer Oasis. She still  complains of some drainage. She has a vein and vascular appointment on 12/30/17 12/04/17; the wounds visually look quite good. Healthy-looking granulation with some degree of epithelialization. We are still waiting for response to our request for trial to try layer Oasis. Her appointment with vascular to review venous and arterial issues isn't sold the end of July 7/31. Not allow debridement or even vigorous cleansing of the wound surface. 12/18/17; slightly smaller especially on the right. 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 We've been using Hydrofera Blue. We still are looking into triple layer Oasis through her insurance 01/08/18 on evaluation today patient's wound actually appears to be showing signs of good improvement at this point in time. She has been tolerating the dressing changes without complication. Fortunately there does not appear to be any evidence of infection at this point in time. We have been utilizing silver nitrate which does seem to be of benefit for her which is also good news. Overall I'm very happy with how things seem to be both regards appearance as well as measurement. Patient did see Dr. Bridgett Larsson for evaluation on 12/30/17. In his assessment he felt that stripping would not likely add much more than chronic compression to the patient's healing process. His recommendation was to follow-up in three months with Dr. Doren Custard if she hasn't healed in order to consider referral back to you and see vascular where she previously was in a trial and was able to get her wound to heal. I'll be see what she feels she when you staying compression and he reiterated this as well. 01/13/18 on evaluation today patient appears to actually be doing very well in regard to her bilateral medial malleolus ulcers. She seems to have tolerated the chemical cauterization with silver nitrate last week she did have some pain through that evening but fortunately states that I'll  be see since it seems to be doing better she is overall pleased with the progress. 01/21/18; really quite a remarkable improvement since I've last seen these wounds. We started using silver nitrate specially on the islands of hyper granulation which for some reason her around the wound circumference. This is really done quite nicely. Primary dressing Hydrofera Blue under 4 layer compression. She seems to be able to hold out without a nurse rewrap. Follow-up in 1 week 01/28/18; we've continued the hydrofera blue but continued with chemical cauterization to the wound area that we started about a month ago for irregular hyper granulation. She is made almost stunning improvement in the overall wound dimensions. I was not really expecting this degree of improvement in these chronic wounds 02/05/18; we continue with Hydrofera Bluebut of also continued the aggressive chemical cauterization with silver nitrate. We made nice progress with the right greater than left wound. 02/12/18. We continued with Hydrofera Blue after aggressive chemical cauterization with silver nitrate. We appear to be making nice progress with both wound areas 02/19/2018; we continue with [facility] after washing the wounds vigorously with Anasept spray and chemical cauterization with silver nitrate. We are making excellent progress. The area on the right's just about closed 02/26/2018. The area on the left medial  ankle had too much necrotic debris today. I used a #5 curette we are able to get most of the soft. I continued with the silver nitrate to the much smaller wound on the right medial ankle she had a new area on her right lower pretibial area which she says was due to a role in her compression 03/05/2018; both wound areas look healthy. Not much change in dimensions from last week. I continue to use silver nitrate and Hydrofera Blue. The patient saw Dr. Doren Custard of vein and vascular. He felt she had venous stasis ulcers. He felt  based on her previous arteriogram she should have adequate circulation for healing. Also she has deep venous reflux but really no significant correctable superficial venous reflux at this time. He felt we should continue with conservative management including leg elevation and compression 04/02/2018; since we last saw this woman about a month ago she had a fall apparently suffered a pelvic fracture. I did not look up the x-ray. Nevertheless because of pain she literally was bedbound for 2 weeks and had home health coming out to change the dressing. Somewhat predictably this is resulted in considerable improvement in both wound areas. The right is just about closed on the medial malleolus and the left is about half the size. 04/16/2018; both her wounds continue to go down in size. Using Hydrofera Blue. 05/07/18; both her wounds appeared to be improving especially on the right where it is almost closed. We are using Hydrofera Blue 05/14/2018; slightly worse this week with larger wounds. Surface on the left medial not quite as good. We have been using Hydrofera Blue 05/21/18; again the wounds are slightly larger. Left medial malleolus slightly larger with eschar around the circumference. We have been using Hydrofera Blue undergoing a wraps for a prolonged period of time. This got a lot better when she was more recumbent due to a fall and a back injury. I change the primary dressing the silver alginate today. She did not tolerate a 4 layer compression previously although I may need to bring this up with her next time 05/28/2018; area on the left medial malleolus again is slightly larger with more drainage. Area on the right is roughly unchanged. She has a small area of folliculitis on the right medial just on the lower calf. This does not look ominous. 06/03/2018 left medial malleolus slightly smaller in a better looking surface. We used silver nitrate on this last time with silver alginate. The area  on the right appears slightly smaller 1/10; left medial malleolus slightly smaller. Small open area on the right. We used silver nitrate and silver alginate as of 2 weeks ago. We continue with the wound and compression. These got a lot better when she was off her feet 1/17; right medial malleolus wound is smaller. The left may be slightly smaller. Both surfaces look somewhat better. 1/24; both wounds are slightly smaller. Using silver alginate under Unna boots 1/31; both wounds appear smaller in fact the area on the right medial is just about closed. Surface eschar. We have been using silver alginate under Unna boots. The patient is less active now spends let much less time on her feet and I think this is contributed to the general improvement in the wound condition 2/7; both wounds appear smaller. I was hopeful the right medial would be closed however there there is still the same small open area. Slight amount of surface eschar on the left the dimensions are smaller there is eschar but  the wound edges appear to be free. We have been using silver alginate under Unna boot's 2/14; both wounds once again measure smaller. Circumferential eschar on the left medial. We have been using silver alginate under Unna boots with gradual improvement 2/21; the area on the right medial malleolus has healed. The area on the left is smaller. We have been using silver alginate and Unna boots. We can discharge wrapping the right leg she has 20/30 stockings at home she will need to protect the scar tissue in this area 2/28; the area on the right medial malleolus remains closed the patient has a compression stocking. The area on the left is smaller. We have been using silver alginate and Unna boots. 3/6 the area on the right medial ankle remains closed. Good edema control noted she is using her own compression stocking. The area on the left medial ankle is smaller. We have been managing this with silver alginate and  Unna boots which we will continue today. 3/13; the area on the right medial ankle remains closed and I'm declaring it healed today. When necessary the left is about the same still a healthy-looking surface but no major change and wound area. No evidence of infection and using silver alginate under unna and generally making considerable improvement 3/27 the area on the right medial ankle remains closed the area on the left is about the same as last week. Certainly not any worse we have been using silver alginate under an Unna boot 4/3; the area on the right medial ankle remains closed per the patient. We did not look at this wound. The wound on the left medial ankle is about the same surface looks healthy we have been using silver alginate under an Unna boot 4/10; area on the right medial ankle remains closed per the patient. We did not look at this wound. The wound on the left medial ankle is slightly larger. The patient complains that the [facility] caused burning pain all week. She also told us that she was a lot more active this week. Changed her back to silver alginate 4/17; right medial ankle still closed per the patient. Left medial ankle is slightly larger. Using silver alginate. She did not tolerate Hydrofera Blue on this area 4/24; right medial ankle remains closed we have not look at this. The left medial ankle continues to get larger today by about a centimeter. We have been using silver alginate under Unna boots. She complains about 4 layer compression as an alternative. She has been up on her feet working on her garden 5/8; right medial ankle remains closed we did not look at this. The left medial ankle has increased in size about 100%. We have been using silver alginate under Unna boots. She noted increased pain this week and was not surprised that the wound is deteriorated 5/15; no major change in SA however much less erythema ( one week of doxy ocellulitis). 5/22-64 year old  female returns at 1 week to the clinic for left medial ankle wound for which we have been using silver alginate under 3 layer compression She was placed on DOXY at last visit - the wound is wider at this visit. She is in 3 layer compression 5/29; change to [facility] last week. I had given her empiric doxycycline 2 weeks ago for a week. She is in 3 layer compression. She complains of a lot of pain and drainage on presentation today. 6/5; using Hydrofera Blue. I gave her doxycycline recently empirically for  erythema and pain around the wound. Believe her cultures showed enterococcus which not would not have been well covered by doxycycline nevertheless the wound looks better and I don't feel specifically that the enterococcus needs to be covered. She has a new what looks like a wrap injury on her lateral left ankle. 6/12; she is using Hydrofera Blue. She has a new area on the left anterior lower tibial area. This was a wrap injury last week. 6/19; the patient is using Hydrofera Blue. She arrived with marked inflammation and erythema around the wound and tenderness. 12/01/18 on evaluation today patient appears to be doing a little bit better based on what I'm hearing from the standpoint of lassos evaluation to this as far as the overall appearance of the wound is concerned. Then sometime substandard she typically sees Dr. Dellia Nims. Nonetheless overall very pleased with the progress that she's made up to this point. No fevers, chills, nausea, or vomiting noted at this time. 7/10; some improvement in the surface area. Aggressively debrided last week apparently. I went ahead with the debridement today although the patient does not tolerate this very well. We have been using Iodoflex. Still a fair amount of drainage 7/17; slightly smaller. Using Iodoflex. 7/24; no change from last week in terms of surface area. We have been using Iodoflex. Surface looks and continues to look somewhat better 7/31;  surface area slightly smaller better looking surface. We have been using Iodoflex. This is under Unna boot compression 8/7-Patient presents at 1 week with Unna boot and Iodoflex, wound appears better 8/14-Patient presents at 1 week with Iodoflex, we use the Unna boot, wound appears to be stable better.Patient is getting Botox treatment for the inversion of the foot for tendon release, Next week 8/21; we are using Iodoflex. Unna boot. The wound is stable in terms of surface area. Under illumination there is some areas of the wound that appear to be either epithelialized or perhaps this is adherent slough at this point I was not really clear. It did not wipe off and I was reluctant to debride this today. 8/28; we are using Iodoflex in an Unna boot. Seems to be making good improvement. 9/4; using Iodoflex and wound is slightly smaller. 9/18; we are using Iodoflex with topical silver nitrate when she is here. The wound continues to be smaller 10/2; patient missed her appointment last week due to GI issues. She left and Iodoflex based dressing on for 2 weeks. Wound is about the same size about the size of a dime on the left medial lower 10/9 we have been using Iodoflex on the medial left ankle wound. She has a new superficial probable wrap injury on the dorsal left ankle 10/16; we have been using Hydrofera Blue since last week. This is on the left medial ankle 10/23; we have been using Hydrofera Blue since 2 weeks ago. This is on the left medial ankle. Dimensions are better 11/6; using Hydrofera Blue. I think the wound is smaller but still not closed. Left medial ankle 11/13; we have been using Hydrofera Blue. Wound is certainly no smaller this week. Also the surface not as good. This is the remanent of a very large area on her left medial ankle. 11/20; using Sorbact since last week. Wound was about the same in terms of size although I was disappointed about the surface debris 12/11; 3-week  follow-up. Patient was on vacation. Wound is measuring slightly larger we have been using Sorbact. 12/18; wound is about the same size  however surface looks better last week after debridement. We have been using Sorbact under compression 1/15 wound is probably twice the size of last time increased in length nonviable surface. We have been using Sorbact. She was running a mild fever and missed her appointment last week 1/22; the wound is come down in size but under illumination still a very adherent debris we have been Hydrofera Blue that I changed her to last week 1/29; dimensions down slightly. We have been using Hydrofera Blue 2/19 dimensions are the same however there is rims of epithelialization under illumination. Therefore more the surface area may be epithelialized 2/26; the patient's wound actually measures smaller. The wound looks healthy. We have been using Hydrofera Blue. I had some thoughts about running Apligraf then I still may do that however this looks so much better this week we will delay that for now 3/5; the wound is small but about the same as last week. We have been using Hydrofera Blue. No debridement is required today. 3/19; the wound is about the size of a dime. Healthy looking wound even under illumination. We have been using Hydrofera Blue. No mechanical debridement is necessary 3/26; not much change from last week although still looks very healthy. We have been using Hydrofera Blue under Unna boots Patient was offered an ankle fusion by podiatry but not until the wound heals with a proceed with this. Electronic Signature(s) Signed: 08/27/2019 8:02:09 AM By: Linton Ham MD Entered By: Linton Ham on 08/26/2019 13:40:48 -------------------------------------------------------------------------------- Physical Exam Details Patient Name: Date of Service: [name], [name] 08/26/2019 12:30 PM Medical Record [id number] Patient Account Number:  [id number] Date of Birth/Sex: Treating RN: Oct 12, 1955 (64 y.o. [name] Primary Care Provider: [name] Other Clinician: Referring Provider: Treating Provider/Extender:Trezure Cronk, Velva Harman, Essie Christine in Treatment: 350 Constitutional Sitting or standing Blood Pressure is within target range for patient.. Pulse regular and within target range for patient.Marland Kitchen Respirations regular, non-labored and within target range.. Temperature is normal and within the target range for the patient.Marland Kitchen Appears in no distress. Notes Wound exam; left medial malleolus under illumination the wound looks healthy no mechanical debridement Anasept and gauze as usual. There is no surrounding erythema no edema Electronic Signature(s) Signed: 08/27/2019 8:02:09 AM By: Linton Ham MD Entered By: Linton Ham on 08/26/2019 13:41:18 -------------------------------------------------------------------------------- Physician Orders Details Patient Name: Date of Service: [name] 08/26/2019 12:30 PM Medical Record [id number] Patient Account Number: [id number] Date of Birth/Sex: Treating RN: 01-17-1956 (64 y.o. 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 - nurse visit Dressing Change Frequency Wound #3 Left,Medial Malleolus Do not change entire dressing for one week. - change dressing in one week Skin Barriers/Peri-Wound Care Moisturizing lotion TCA Cream or Ointment - mixed with lotion Wound Cleansing Clean wound with Wound Cleanser - clean with anasept with dressing  changes May shower with protection. Primary Wound Dressing Wound #3 Left,Medial Malleolus Hydrofera Blue - classic Secondary Dressing Wound #3 Left,Medial Malleolus Dry Gauze Edema Control Unna Boot to Left Lower Extremity - no kerlix layer Avoid standing for long periods of time Elevate legs to the level of the heart or above for 30 minutes daily and/or when sitting, a frequency  of: - throughout the day Support Garment 20-30 mm/Hg pressure to: - compression stocking right leg daily Electronic Signature(s) Signed: 08/26/2019 5:41:24 PM By: Kela Millin Signed: 08/27/2019 8:02:09 AM By: Linton Ham MD Entered By: Kela Millin on 08/26/2019 13:30:52 -------------------------------------------------------------------------------- Problem List Details Patient Name: Date of Service: [name] 08/26/2019 12:30 PM Medical Record [id number] Patient Account Number: [id number] Date of Birth/Sex: Treating RN: 26-Feb-1956 (64 y.o. 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 08/03/2014 08/03/2014 and inflammation L03.116 Cellulitis of left lower limb 09/07/2014 09/07/2014 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 11/16/2014 11/16/2014 and inflammation Resolved Problems ICD-10 Code Description Active Date Resolved Date L97.312 Non-pressure chronic ulcer of right ankle with fat layer 04/10/2016  04/10/2016 exposed Electronic Signature(s) Signed: 08/27/2019 8:02:09 AM By: Linton Ham MD Entered By: Linton Ham on 08/26/2019 13:39:42 -------------------------------------------------------------------------------- Progress Note Details Patient Name: Date of Service: [name] 08/26/2019 12:30 PM Medical Record [id number] Patient Account Number: [id number] Date of Birth/Sex: Treating RN: 10/04/1955 (64 y.o. [name] Primary Care Provider: [name] Other Clinician: Referring Provider: Treating Provider/Extender:Markasia Carrol, Velva Harman, Essie Christine in Treatment: 350 Subjective History of Present Illness (HPI) the remaining wound is over the left medial ankle. Similar wound over the right medial ankle healed largely with use of Apligraf. Most recently we have been using Hydrofera Blue over this wound with considerable improvement. The patient has been extensively worked up in the past for her venous insufficiency and she is not a candidate for antireflux surgery although I have none of the details available currently. 08/24/14; considerable improvement today. About 50% of this wound areas now epithelialized. The base of the wound appears to be healthier granulation.as opposed to last week when she had deteriorated a considerable improvement 08/17/14; unfortunately the wound has regressed somewhat. The areas of epithelialization from the superior aspect are not nearly as healthy as they were last week. The patient thinks her Hydrofera Blue slipped. 09/07/14; unfortunately the area has markedly regressed in the 2 weeks since I've seen this. There is an odor surrounding erythema. The healthy granulation tissue that we had at the base of the wound now is a dusky color. The nurse reports green drainage 09/14/14; the area looks somewhat better than last week. There is less erythema and less drainage. The culture I did did not show any growth. Nevertheless I  think it is better to continue the Cipro and doxycycline for a further week. The remaining wound area was debridement. 09/21/14. Wound did not require debridement last week. Still less erythema and less drainage. She can complete her antibiotics. The areas of epithelialization in the superior aspect of the wound do not look as healthy as they did some weeks ago 10/05/14 continued improvement in the condition of this wound. There is advancing epithelialization. Less aggressive debridement required 10/19/14 continued improvement in the condition and volume of this wound. Less aggressive debridement to the inferior part of this to remove surface slough and fibrinous eschar 11/02/14 no debridement is required. The surface granulation appears healthy although some  of her islands of epithelialization seem to have regressed. No evidence of infection 11/16/14; lites surface debridement done of surface eschar. The wound does not look to be unhealthy. No evidence of infection. Unfortunately the patient has had podiatry issues in the right foot and for some reason has redeveloped small surface ulcerations in the medial right ankle. Her original presentation involved wounds in this area 11/23/14 no debridement. The area on the right ankle has enlarged. The left ankle wound appears stable in terms of the surface although there is periwound inflammation. There has been regression in the amount of new skin 11/30/14 no debridement. Both wound areas appear healthy. There was no evidence of infection. The the new area on the right medial ankle has enlarged although that both the surfaces appear to be stable. 12/07/14; Debridement of the right medial ankle wound. No no debridement was done on the left. 12/14/14 no major change in and now bilateral medial ankle wounds. Both of these are very painful but the no overt evidence of infection. She has had previous venous ablation 12/21/14; patient states that her right medial ankle  wound is considerably more painful last week than usual. Her left is also somewhat painful. She could not tolerate debridement. The right medial ankle wound has fibrinous surface eschar 12/28/14 this is a patient with severe bilateral venous insufficiency ulcers. For a considerable period of time we actually had the one on the right medial ankle healed however this recently opened up again in June. The left medial ankle wound has been a refractory area with some absent flows. We had some success with Hydrofera Blue on this area and it literally closed by 50% however it is recently opened up Foley. Both of these were debridement today of surface eschar. She tolerates this poorly 01/25/15: No change in the status of this. Thick adherent escar. Very poor tolerance of any attempt at debridement. I had healed the right medial malleolus wound for a considerable amount of time and had the left one down to about 50% of the volume although this is totally regressed over the last 48 weeks. Further the right leg has reopened. she is trying to make a appointment with pain and vascular, previous ablations with Dr. Aleda Grana. I do not believe there is an arterial insufficiency issue here 02/01/15 the status of the adherent eschar bilaterally is actually improved. No debridement was done. She did not manage to get vascular studies done 02/08/15 continued debridement of the area was done today. The slough is less adherent and comes off with less pressure. There is no surrounding infection peripheral pulses are intact 02/15/15 selective debridement with a disposable curette. Again the slough is less adherent and comes off with less difficulty. No surrounding infection peripheral pulses are intact. 02/22/15 selective debridement of the right medial ankle wound. Slough comes off with less difficulty. No obvious surrounding infection peripheral pulses are intact I did not debridement the one on the left. Both of these  are stable to improved 03/01/15 selective debridement of both wound areas using a curette to. Adherent slough cup soft with less difficulty. No obvious surrounding infection. The patient tells me that 2 days ago she noted a rash above the right leg wrap. She did not have this on her lower legs when she change this over she arrives with widespread left greater than right almost folliculitis-looking rash which is extremely pruritic. I don't see anything to culture here. There is no rash on the rest of her body. She  feels well systemically. 03/08/15; selective debridement of both wounds using a curette. Base of this does not look unhealthy. She had limegreen drainage coming out of the left leg wound and describes a lot of drainage. The rash on her left leg looks improved to. No cultures were done. 03/22/15; patient was not here last week. Basal wounds does not look healthy and there is no surrounding erythema. No drainage. There is still a rash on the left leg that almost looks vasculitic however it is clearly limited to the top of where the wrap would be. 04/05/15; on the right required a surgical debridement of surface eschar and necrotic subcutaneous tissue. I did not debridement the area on the left. These continue to be large open wounds that are not changing that much. We were successful at one point in healing the area on the right, and at the same time the area on the left was roughly half the size of current measurements. I think a lot of the deterioration has to do with the prolonged time the patient is on her feet at work 04/19/15 I attempted-like surface debridement bilaterally she does not tolerate this. She tells me that she was in allergic care yesterday with extreme pain over her left lateral malleolus/ankle and was told that she has an "sprain" 05/03/15; large bilateral venous insufficiency wounds over the medial malleolus/medial aspect of her ankles. She complains of copious amounts  of drainage and his usual large amounts of pain. There is some increasing erythema around the wound on the right extending into the medial aspect of her foot to. historically she came in with these wounds the right one healed and the left one came down to roughly half its current size however the right one is reopened and the left is expanded. 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 05/10/15 large bilateral venous insufficiency wounds. There is less adherence surface left however the surface culture that I did last week grew pseudomonas therefore bilateral selective debridement score necessary. There is surrounding erythema. The patient describes severe bilateral drainage and a lot of pain in the left ankle. Apparently her podiatrist was were ready to do a cortisone shot 05/17/15; the patient complains of pain and again copious amounts of drainage. 05/24/15; we used Iodo flex last week. Patient notes considerable improvement in wound drainage. Only needed to change this once. 05/31/15; we continued Iodoflex; the base of these large wounds bilaterally is not too bad but there is probably likely a significant bioburden here. I would like to debridement just doesn't tolerate it. 06/06/14 I would like to continue the Iodoflex although she still hasn't managed to obtain supplies. She has bilateral medial malleoli or large wounds which are mostly superficial. Both of them are covered circumferentially with some nonviable fibrinous slough although she tolerates debridement very poorly. She apparently has an appointment for an ablation on the right leg by interventional radiology. 06/14/15; the patient arrives with the wounds and static condition. We attempted a debridement although she does not do well with this secondary to pain. I 07/05/15; wounds are not much smaller however there appears to be a cleaner granulating base. The left has tight fibrinous  slough greater than the right. Debridement is tolerated poorly due to pain. Iodoflex is done more for these wounds in any of the multitude of different dressings I have tried on the left 1 and then subsequently the right. 07/12/15; no  change in the condition of this wound. I am able to do an aggressive debridement on the right but not the left. She simply cannot tolerate it. We have been using Iodoflex which helps somewhat. It is worthwhile remembering that at one point we healed the right medial ankle wound and the left was about 25% of the current circumference. We have suggested returning to vascular surgery for review of possible further ablations for one reason or another she has not been able to do this. 07/26/15 no major change in the condition of either wound on her medial ankle. I did not attempt to debridement of these. She has been aggressively scrubbing these while she is in the shower at home. She has her supply of Iodoflex which seems to have done more for these wounds then anything I have put on recently. 08/09/15 wound area appears larger although not verified by measurements. Using Iodoflex 09/05/2015 -- she was here for avisit today but had significant problems with the wound and I was asked to see her for a physician opinion. I have summarize that this lady has had surgery on her left lower extremity about 10 years ago where the possible veins stripping was done. She has had an opinion from interventional radiology around November 2016 where no further sclerotherapy was ordered. The patient works 12 hours a day and stands on a concrete floor with work boots and is unable to get the proper compression she requires and cannot elevate her limbs appropriately at any given time. She has recently grown Pseudomonas from her wound culture but has not started her ciprofloxacin which was called in for her. 09/13/15 this continues to be a difficult situation for this patient. At one point I had  this wound down to a 1.5 x 1.5" wound on her left leg. This is deteriorated and the right leg has reopened. She now has substantial wounds on her medial calcaneus, malleoli and into her lower leg. One on the left has surface eschar but these are far too painful for me to debridement here. She has a vascular surgery appointment next week to see if anything can be done to help here. I think she has had previous ablations several years ago at [facility]. She has no major edema. She tells me that she did not get product last time [facility]] and went for several days without it. She continues to work in work boots 12 hours a day. She cannot get compression/4-layer under her work boots. 09/20/15 no major change. Periwound edema control was not very good. Her point with pain and vascular is next Wednesday the 25th 09/28/15; the patient is seen vascular surgery and is apparently scheduled for repeat duplex ultrasounds of her bilateral lower legs next week. 10/05/15; the patient was seen by Dr. Doren Custard of vascular surgery. He feels that she should have arterial insufficiency excluded as cause/contributed to her nonhealing stage she is therefore booked for an arteriogram. She has apparently monophasic signals in the dorsalis pedis pulses. She also of course has known severe chronic venous insufficiency with previous procedures as noted previously. I had another long discussion with the patient today about her continuing to work 12 hour shifts. I've written her out for 2 months area had concerns about this as her work location is currently undergoing significant turmoil and this may lead to her termination. She is aware of this however I agree with her that she simply cannot continue to stand for 12 hours multiple days a week  with the substantial wound areas she has. 10/19/15; the Dr. Doren Custard appointment was largely for an arteriogram which was normal. She does not have an arterial issue. He didn't make a  comment about her chronic venous insufficiency for which she has had previous ablations. Presumably it was not felt that anything additional could be done. The patient is now out of work as I prescribed 2 weeks ago. Her wounds look somewhat less aggravated presumably because of this. I felt I would give debridement another try today 10/25/15; no major change in this patient's wounds. We are struggling to get her product that she can afford into her own home through her insurance. 11/01/15; no major change in the patient's wounds. I have been using silver alginate as the most affordable product. I spoke to Dr. Marla Roe last week with her requested take her to the OR for surgical debridement and placement of ACEL. Dr. Marla Roe told me that she would be willing to do this however [facility] will not cover this, fortunately the patient has [place] healthcare of some variant 11/08/15; no major change in the patient's wounds. She has been completely nonviable surface that this but is in too much pain with any attempted debridement are clinic. I have arranged for her to see Dr. Marla Roe ham of plastic surgery and this appointment is on Monday. I am hopeful that they will take her to the OR for debridement, possible ACEL ultimately possible skin graft 11/22/15 no major change in the patient's wounds over her bilateral medial calcaneus medial malleolus into the lower legs. Surface on these does not look too bad however on the left there is surrounding erythema and tenderness. This may be cellulitis or could him sleepy tinea. 11/29/15; no major changes in the patient's wounds over her bilateral medial malleolus. There is no infection here and I don't think any additional antibiotics are necessary. There is now plan to move forward. She sees Dr. Marla Roe in a week's time for preparation for operative debridement and ACEL placement I believe on 7/12. She then has a follow-up appointment with Dr.  Marla Roe on 7/21 12/28/15; the patient returns today having been taken to the [place] by Dr. Marla Roe 12/12/15 she underwent debridement, intraoperative cultures [which were negative]. She had placement of a wound VAC. Parent really ACEL was not available to be placed. The wound VAC foam apparently adhered to the wound since then she's been using silver alginate, Xeroform under Ace wraps. She still says there is a lot of drainage and a lot of pain 01/31/16; this is a patient I see monthly. I had referred her to Dr. Marla Roe him of plastic surgery for large wounds on her bilateral medial ankles. She has been to the OR twice once in early July and once in early August. She tells me over the last 3 weeks she has been using the wound VAC with ACEL underneath it. On the right we've simply been using silver alginate. Under Kerlix Coban wraps. 02/28/16; this is a patient I'm currently seeing monthly. She is gone on to have a skin graft over her large venous insufficiency ulcer on the left medial ankle. This was done by Dr. Marla Roe him. The patient is a bit perturbed about why she didn't have one on her right medial ankle wound. She has been using silver alginate to this. 03/06/16; I received a phone call from her plastic surgery Dr. Marla Roe. She expressed some concern about the viability of the skin graft she did on the  left medial ankle wound. Asked me to place Endoform on this. She told me she is not planning to do a subsequent skin graft on the right as the left one did not take very well. I had placed Hydrofera Blue on the right 03/13/16; continue to have a reasonably healthy wound on the right medial ankle. Down to 3 mm in terms of size. There is epithelialization here. The area on the left medial ankle is her skin graft site. I suppose the last week this looks somewhat better. She has an open area inferiorly however in the center there appears to be some viable tissue. There is a lot of surface  callus and eschar that will eventually need to come off however none of this looked to be infected. Patient states that the is able to keep the dressing on for several days which is an improvement. 03/20/16 no major change in the circumference of either wound however on the left side the patient was at Dr. Eusebio Friendly office and they did a debridement of left wound. 50% of the wound seems to be epithelialized. I been using Endoform on the left Hydrofera Blue in the right 03/27/16; she arrives today with her wound is not looking as healthy as they did last week. The area on the right clearly has an adherent surface to this a very similar surface on the left. Unfortunately for this patient this is all too familiar problem. Clearly the Endoform is not working and will need to change that today that has some potential to help this surface. She does not tolerate debridement in this clinic very well. She is changing the dressing wants 04/03/16; patient arrives with the wounds looking somewhat better especially on the right. Dr. Migdalia Dk change the dressing to silver alginate when she saw her on Monday and also sold her some compression socks. The usefulness of the latter is really not clear and woman with severely draining wounds. 04/10/16; the patient is doing a bit of an experiment wearing the compression stockings that Dr. Migdalia Dk provided her to her left leg and the out of legs based dressings that we provided to the right. 05/01/16; the patient is continuing to wear compression stockings Dr. Migdalia Dk provided her on the left that are apparently silver impregnated. She has been using Iodoflex to the right leg wound. Still a moderate amount of drainage, when she leaves here the wraps only last for 4 days. She has to change the stocking on the left leg every night 05/15/16; she is now using compression stockings bilaterally provided by Dr. Marla Roe. She is wearing a nonadherent layer over the wounds so  really I don't think there is anything specific being done to this now. She has some reduction on the left wound. The right is stable. I think all healing here is being done without a specific dressing 06/09/16; patient arrives here today with not much change in the wound certainly in diameter to large circular wounds over the medial aspect of her ankle bilaterally. Under the light of these services are certainly not viable for healing. There is no evidence of surrounding infection. She is wearing compression stockings with some sort of silver impregnation as prescribed by Dr. Marla Roe. She has a follow-up with her tomorrow. 06/30/16; no major change in the size or condition of her wounds. These are still probably covered with a nonviable surface. She is using only her purchase stockings. She did see Dr. Marla Roe who seemed to want to apply Dakin's solution to this  I'm not extreme short what value this would be. I would suggest Iodoflex which she still has at home. 07/28/16; I follow Mrs. Provencal episodically along with Dr. Marla Roe. 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 She has been applying a topical collagen ointment to both wounds with Adaptic. I don't think Dr. Marla Roe is planning to take her back to the OR. 08/19/16; I follow Mrs. Jeneen Rinks on a monthly basis along with Dr. Marla Roe of plastic surgery. 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 I been following her for a number of years. At one point I was able to get the right medial malleolus wound to heal and had the left medial malleolus down to about half its current size however and I had to send her to plastic surgery for an operative debridement. Since then things have been stable to slightly improve the area on the right is slightly better one in the left about the same although there is much less  adherent surface than I'm used to with this patient. She is using some form of liquid collagen gel that Dr. Marla Roe provided a Kerlix cover with the patient's own pressure stockings. She tells me that she has extreme pain in both ankles and along the lateral aspect of both feet. She has been unable to work for some period of time. She is telling me she is retiring at the beginning of April. She sees Dr. Doran Durand of orthopedics next week 09/22/16; patient has not seen Dr. Marla Roe since the last time she is here. I'm not really sure what she is using to the wounds other than bits and pieces of think she had left over including most recently Hydrofera Blue. She is using juxtalite stockings. She is having difficulty with her husband's recent illness "stroke". She is having to transport him to various doctors appointments. Dr. Marla Roe left her the option of a repeat debridement with ACEL however she has not been able to get the time to follow-up on this. She continues to have a fair amount of drainage out of these wounds with certainly precludes leaving dressings on all week 10/13/16; patient has not seen Dr. Marla Roe since she was last in our clinic. I'm not really sure what she is doing with the wounds, we did try to get her [facility] and I think she is actually using this most of the time. Because of drainage she states she has to change this every second day although this is an improvement from what she used to do. She went to see Dr. Doran Durand who did not think she had a muscular issue with regards to her feet, he referred her to a neurologist and I think the appointment is sometime in June. I changed her back to Iodoflex which she has used in the past but not recently. 11/03/16; the patient has been using Iodoflex although she ran out of this. Still claims that there is a lot of drainage although the wound does not look like this. No surrounding erythema. She has not been back to see Dr.  Marla Roe 11/24/16; the patient has been using Iodoflex again but she ran out of it 2 or 3 days ago. There is no major change in the condition of either one of these wounds in fact they are larger and covered in a thick adherent surface slough/nonviable tissue especially on the left. She does not tolerate mechanical debridement  in our clinic. Going back to see Dr. Marla Roe of plastic surgery for an operative debridement would seem reasonable. 12/15/16; the patient has not been back to see Dr. Marla Roe. She is been dealing with a series of illnesses and her husband which of monopolized her time. She is been using Sorbact which we largely supplied. She states the drainage is bad enough that it maximum she can go 2-3 days without changing the dressing 01/12/2017 -- the patient has not been back for about 4 weeks and has not seen Dr. Marla Roe not does she have any appointment pending. 01/23/17; patient has not seen Dr. Marla Roe even though I suggested this previously. She is using Santyl that was suggested last week by Dr. Con Memos this Cost her $16 through her insurance which is indeed surprising 02/12/17; continuing Santyl and the patient is changing this daily. A lot of drainage. She has not been back to see plastic surgery she is using an Ace wrap. Our intake nurse suggested wrap around stockings which would make a good reasonable alternative 02/26/17; patient is been using Santyl and changing this daily due to drainage. She has not been to see plastic surgery she uses in April Ace wrap to control the edema. She did obtain extremitease stockings but stated that the edema in her leg was to big for these 03/20/17; patient is using Santyl and Anasept. Surfaces looked better today the area on the right is actually measuring a little smaller. She has states she has a lot of pain in her feet and ankles and is asking for a consult to pain control which I'll try to help her with through our case  manager. 04/10/17; the patient arrives with better-looking wound surfaces and is slightly smaller wound on the left she is using a combination of Santyl and Anasept. She has an appointment or at least as started in the pain control center associated with [facility] 05/14/17; this is a patient who I followed for a prolonged period of time. 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 At one point I had this down to a much smaller wound on the left however these reopened and we've never been able to get these to heal. She has been using Santyl and Anasept gel although 2 weeks ago she ran out of the Anasept gel. She has a stable appearance of the wound. She is going to the wound care clinic at [facility]. They wanted do a nerve block/spinal block although she tells me she is reluctant to go forward with that. 05/21/17; this is a patient I have followed for many years. 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 Chronic pain and deformity in her ankles as well. She is been to see plastic surgery as well as orthopedics. Using PolyMem AG most recently/Kerramax/ABDs and 2 layer compression. She has managed to keep this on and she is coming in for a nurse check to change the dressing on Tuesdays, we see her on Fridays 06/05/17; really quite a good looking surface and the area especially on the right medial has contracted in terms of dimensions. Well granulated healthy-looking tissue on both sides. Even with an open curet there is nothing that even feels abnormal here. This is as good as I've seen this in quite some time. We have been using PolyMem AG and bringing her in for a nurse check 06/12/17; really quite good surface on both of these wounds. The right medial has contracted a bit left is  not. We've been using PolyMem and AG and she is coming in for a nurse visit 06/19/17; we have been using PolyMem AG and bringing her in for a nurse check.  Dimensions of her wounds are not better but the surfaces looked better bilaterally. She complained of bleeding last night and the left wound and increasing pain bilaterally. She states her wound pain is more neuropathic than just the wounds. There was some suggestion that this was radicular from her pain management doctor in talking to her it is really difficult to sort this out. 06/26/17; using PolyMem and AG and bringing her in for a nurse check as All of this and reasonably stable condition. Certainly not improved. The dimensions on the lateral part of the right leg look better but not really measuring better. The medial aspect on the left is about the same. 07/03/16; we have been using PolyMen AG and bringing her in for a nurse check to change the dressings as the wounds have drainage which precludes once weekly changing. We are using all secondary absorptive dressings.our intake nurse is brought up the idea of using a wound VAC/snap VAC on the wound to help with the drainage to see if this would result in some contraction. This is not a bad idea. The area on the right medial is actually looking smaller. Both wounds have a reasonable-looking surface. There is no evidence of cellulitis. The edema is well controlled 07/10/17; the patient was denied for a snap VAC by her insurance. The major issue with these wounds continues to be drainage. We are using wicked PolyMem AG and she is coming in for a nurse visit to change this. The wounds are stable to slightly improved. The surface looks vibrant and the area on the right certainly has shrunk in size but very slowly 07/17/17; the patient still has large wounds on her bilateral medial malleoli. Surface of both of these wounds looks better. The dimensions seem to come and go but no consistent improvement. There is no epithelialization. We do not have options for advanced treatment products due to insurance issues. They did not approve of the wound VAC to  help control the drainage. More recently we've been using PolyMem and AG wicked to allow drainage through. We have been bringing her in for a nurse visit to change this. We do not have a lot of options for wound care products and the home again due to insurance issues 07/24/17; the patient's wound actually looks somewhat better today. No drainage measurements are smaller still healthy-looking surface. We used silver collagen under PolyMen started last week. We have been bringing her in for a dressing change 07/31/17; patient's wound surface continued to look better and I think there is visible change in the dimensions of the wound on the right. Rims of epithelialization. We have been using silver collagen under PolyMen and bringing her in for a dressing change. There appears to be less drainage although she is still in need of the dressing change 08/07/17. Patient's wound surface continues to look better on both sides and the area on the right is definitely smaller. We have been using silver collagen and PolyMen. She feels that the drainage has been it has been better. I asked her about her vascular status. She went to see Dr. Aleda Grana at [facility] and had some form of ablation. I don't have much detail on this. I haven't my notes from 2016 that she was not a candidate for any further ablation but I  don't have any more information on this. We had referred her to vein and vascular I don't think she ever went. He does not have a history of PAD although I don't have any information on this either. We don't even have ABIs in our record 08/14/17; we've been using silver collagen and PolyMen cover. And putting the patient and compression. She we are bringing her in as a nurse visit to change this because ofarge amount of drainage. We didn't the ABIs in clinic today since they had been done in many moons 1.2 bilaterally. She has been to see vein and vascular however this was at [facility] and she  had ablation although I really don't have any information on this all seemed biking get a report. She is also been operatively debrided by plastic surgery and had a cell placed probably 8-12 months ago. This didn't have a major effect. We've been making some gains with current dressings 08/19/17-She is here in follow-up evaluation for bilateral medial malleoli ulcers. She continues to tolerate debridement very poorly. We will continue with recently changed topical treatment; if no significant improvement may consider switching to Iodosorb/Iodoflex. 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 She has been using silver collagen and PolyMem. I believe she has been used and tried on Iodoflex before. 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 These have been very refractory over time. We have been using silver collagen and PolyMen. She is coming in once a week for a doctor's and once a week for nurse visits. We are actually making some progress 09/18/17; the patient's wounds are smaller especially on the right medial. She arrives today to upset to consider even washing these off with Anasept which I think is been part of the reason this is been closing. We've been using collagen covered in PolyMen otherwise. It is noted that she has a small area of folliculitis on the right medial calf that. As we are wrapping her legs I'll give her a short course of doxycycline to make sure this doesn't amount to anything. She is a long list of complaints today including imbalance, shortness of breath on exertion, inversion of her left ankle. With regards to the latter complaints she is been to see orthopedics and they offered her a tendon release surgery I believe but wanted her  wounds to be closed first. I have recommended she go see her primary physician with regards to everything else. 09/25/17; patient's wounds are about the same size. We have made some progress bilaterally although not in recent weeks. She will not allow me T wash these wounds with Anasept even if she is doing her cell. Wheeze we've been using collagen covered in PolyMen. Last week she had a small area of folliculitis this is now opened into a small wound. She completed 5 days of trimethoprim sulfamethoxazole 10/02/17; unfortunately the area on her left medial ankle is worse with a larger wound area towards the Achilles. The patient complains of a lot of pain. She will not allow debridement although visually I don't think there is anything to debridement in any case. We have been using silver collagen and PolyMen for several months now. Initially we are making some progress although I'm not really seeing that today. We will move back to [facility]  Blue. His admittedly this is a bit of a repeat however I'm hoping that his situation is different now. The patient tells me she had her leg on the left give out on her yesterday this is process some pain. 10/09/17; the patient is seen twice a week largely because of drainage issues coming out of the chronic medial bimalleolar wounds that are chronic. Last week the dimensions of the one on the left looks a little larger I changed her to [facility]. She comes in today with a history of terrible pain in the bilateral wound areas. She will not allow debridement. She will not even allow a tissue culture. There is no surrounding erythema no no evidence of cellulitis. We have been putting her Kerlix Coban man. She will not allow more aggressive compression as there was a suggestion to put her in 3 layer wraps. 10/16/17; large wounds on her bilateral medial malleoli. These are chronic. Not much change from last week. The surface looks have healthy but absolutely no  epithelialization. A lot of pain little less so of drainage. She will not allow debridement or even washing these off in the vigorous fashion with Anasept. 10/23/17; large wounds on her bilateral malleoli which are chronic. Some improvement in terms of size perhaps on the right since last time I saw these. She states that after we increased the 3 layer compression there was some bleeding, when she came in for a nurse visit she did not want 3 layer compression put back on about our nurse managed to convince her. She has known chronic venous visit issues and I'm hoping to get her to tolerate the 3 layer compression. using Hydrofera Blue 10/30/17; absolutely no change in the condition of either wound although we've had some improvement in dimensions on the right.. Attempted to put her in 3 layer compression she didn't tolerated she is back in 2 layer compression. We've been using Hydrofera Blue We looked over her past records. She had venous reflux studies in November 2016. There was no evidence of deep venous reflux on the right. Superficial vein did not show the greater saphenous vein at think this is been previously ablated the small saphenous vein was within normal limits. The left deep venous system showed no DVT the vessels were positive for deep venous reflux in the posterior tibial veins at the ankle. The greater saphenous vein was surgically absent small saphenous vein was within normal limits. She went to vein and vascular at [facility]. I believe she had an ablation on the left greater saphenous vein. I'll update her reflux studies perhaps ever reviewed by vein and vascular. We've made absolutely no progress in these wounds. Will also try to read and TheraSkins through her insurance 11/06/17; W the patient apparently has a 2 week follow-up with vein and vascular I like him to review the whole issue with regards to her previous vascular workup by Dr. Aleda Grana. We've really made no  progress on these wounds in many months. She arrives today with less viable looking surface on the left medial ankle wound. This was apparently looking about the same on Tuesday when she was here for nurse visit. 11/13/17; deep tissue culture I did last time of the left lower leg showed multiple organisms without any predominating. In particular no Staphylococcus or group A strep were isolated. We sent her for venous reflux studies. She's had a previous left greater saphenous vein stripping and I think sclerotherapy of the right greater saphenous vein. She didn't  really look at the lesser saphenous vein this both wounds are on the medial aspect. She has reflux in the common femoral vein and popliteal vein and an accessory vein on the right and the common femoral vein and popliteal vein on the left. I'm going to have her go to see vein and vascular just the look over things and see if anything else beside aggressive compression is indicated here. We have not been able to make any progress on these wounds in spite of the fact that the surface of the wounds is never look too bad. 11/20/17; no major change in the condition of the wounds. Patient reports a large amount of drainage. She has a lot of complaints of pain although enlisting her today I wonder if some of this at least his neuropathic rather than secondary to her wounds. She has an appointment with vein and vascular on 12/30/17. The refractory nature of these wounds in my mind at least need vein and vascular to look over the wounds the recent reflux studies we did and her history to see if anything further can be done here. I also note her gait is deteriorated quite a bit. 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 She has a bilateral Trendelenburg gait. I wonder if this is neuropathic or perhaps multilevel radicular. 11/27/17; her wounds actually looks slightly better. Healthy-looking granulation tissue a scant amount  of epithelialization. [place] [facility] will not pay for [employer]. They will play for tri layer Oasis and Dermagraft. This is not a diabetic ulcer. We'll try for the tri layer Oasis. She still complains of some drainage. She has a vein and vascular appointment on 12/30/17 12/04/17; the wounds visually look quite good. Healthy-looking granulation with some degree of epithelialization. We are still waiting for response to our request for trial to try layer Oasis. Her appointment with vascular to review venous and arterial issues isn't sold the end of July 7/31. Not allow debridement or even vigorous cleansing of the wound surface. 12/18/17; slightly smaller especially on the right. 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 We've been using Hydrofera Blue. We still are looking into triple layer Oasis through her insurance 01/08/18 on evaluation today patient's wound actually appears to be showing signs of good improvement at this point in time. She has been tolerating the dressing changes without complication. Fortunately there does not appear to be any evidence of infection at this point in time. We have been utilizing silver nitrate which does seem to be of benefit for her which is also good news. Overall I'm very happy with how things seem to be both regards appearance as well as measurement. Patient did see Dr. Bridgett Larsson for evaluation on 12/30/17. In his assessment he felt that stripping would not likely add much more than chronic compression to the patient's healing process. His recommendation was to follow-up in three months with Dr. Doren Custard if she hasn't healed in order to consider referral back to you and see vascular where she previously was in a trial and was able to get her wound to heal. I'll be see what she feels she when you staying compression and he reiterated this as well. 01/13/18 on evaluation today patient appears to actually be doing very well in regard to her  bilateral medial malleolus ulcers. She seems to have tolerated the chemical cauterization with silver nitrate last week she did have some pain through that evening but fortunately states that I'll be see since it seems  to be doing better she is overall pleased with the progress. 01/21/18; really quite a remarkable improvement since I've last seen these wounds. We started using silver nitrate specially on the islands of hyper granulation which for some reason her around the wound circumference. This is really done quite nicely. Primary dressing Hydrofera Blue under 4 layer compression. She seems to be able to hold out without a nurse rewrap. Follow-up in 1 week 01/28/18; we've continued the hydrofera blue but continued with chemical cauterization to the wound area that we started about a month ago for irregular hyper granulation. She is made almost stunning improvement in the overall wound dimensions. I was not really expecting this degree of improvement in these chronic wounds 02/05/18; we continue with Hydrofera Bluebut of also continued the aggressive chemical cauterization with silver nitrate. We made nice progress with the right greater than left wound. 02/12/18. We continued with Hydrofera Blue after aggressive chemical cauterization with silver nitrate. We appear to be making nice progress with both wound areas 02/19/2018; we continue with [facility] after washing the wounds vigorously with Anasept spray and chemical cauterization with silver nitrate. We are making excellent progress. The area on the right's just about closed 02/26/2018. The area on the left medial ankle had too much necrotic debris today. I used a #5 curette we are able to get most of the soft. I continued with the silver nitrate to the much smaller wound on the right medial ankle she had a new area on her right lower pretibial area which she says was due to a role in her compression 03/05/2018; both wound areas look healthy.  Not much change in dimensions from last week. I continue to use silver nitrate and Hydrofera Blue. The patient saw Dr. Doren Custard of vein and vascular. He felt she had venous stasis ulcers. He felt based on her previous arteriogram she should have adequate circulation for healing. Also she has deep venous reflux but really no significant correctable superficial venous reflux at this time. He felt we should continue with conservative management including leg elevation and compression 04/02/2018; since we last saw this woman about a month ago she had a fall apparently suffered a pelvic fracture. I did not look up the x-ray. Nevertheless because of pain she literally was bedbound for 2 weeks and had home health coming out to change the dressing. Somewhat predictably this is resulted in considerable improvement in both wound areas. The right is just about closed on the medial malleolus and the left is about half the size. 04/16/2018; both her wounds continue to go down in size. Using Hydrofera Blue. 05/07/18; both her wounds appeared to be improving especially on the right where it is almost closed. We are using Hydrofera Blue 05/14/2018; slightly worse this week with larger wounds. Surface on the left medial not quite as good. We have been using Hydrofera Blue 05/21/18; again the wounds are slightly larger. Left medial malleolus slightly larger with eschar around the circumference. We have been using Hydrofera Blue undergoing a wraps for a prolonged period of time. This got a lot better when she was more recumbent due to a fall and a back injury. I change the primary dressing the silver alginate today. She did not tolerate a 4 layer compression previously although I may need to bring this up with her next time 05/28/2018; area on the left medial malleolus again is slightly larger with more drainage. Area on the right is roughly unchanged. She has a small area  of folliculitis on the right medial just on  the lower calf. This does not look ominous. 06/03/2018 left medial malleolus slightly smaller in a better looking surface. We used silver nitrate on this last time with silver alginate. The area on the right appears slightly smaller 1/10; left medial malleolus slightly smaller. Small open area on the right. We used silver nitrate and silver alginate as of 2 weeks ago. We continue with the wound and compression. These got a lot better when she was off her feet 1/17; right medial malleolus wound is smaller. The left may be slightly smaller. Both surfaces look somewhat better. 1/24; both wounds are slightly smaller. Using silver alginate under Unna boots 1/31; both wounds appear smaller in fact the area on the right medial is just about closed. Surface eschar. We have been using silver alginate under Unna boots. The patient is less active now spends let much less time on her feet and I think this is contributed to the general improvement in the wound condition 2/7; both wounds appear smaller. I was hopeful the right medial would be closed however there there is still the same small open area. Slight amount of surface eschar on the left the dimensions are smaller there is eschar but the wound edges appear to be free. We have been using silver alginate under Unna boot's 2/14; both wounds once again measure smaller. Circumferential eschar on the left medial. We have been using silver alginate under Unna boots with gradual improvement 2/21; the area on the right medial malleolus has healed. The area on the left is smaller. We have been using silver alginate and Unna boots. We can discharge wrapping the right leg she has 20/30 stockings at home she will need to protect the scar tissue in this area 2/28; the area on the right medial malleolus remains closed the patient has a compression stocking. The area on the left is smaller. We have been using silver alginate and Unna boots. 3/6 the area on the right  medial ankle remains closed. Good edema control noted she is using her own compression stocking. The area on the left medial ankle is smaller. We have been managing this with silver alginate and Unna boots which we will continue today. 3/13; the area on the right medial ankle remains closed and I'm declaring it healed today. When necessary the left is about the same still a healthy-looking surface but no major change and wound area. No evidence of infection and using silver alginate under unna and generally making considerable improvement 3/27 the area on the right medial ankle remains closed the area on the left is about the same as last week. Certainly not any worse we have been using silver alginate under an Unna boot 4/3; the area on the right medial ankle remains closed per the patient. We did not look at this wound. The wound on the left medial ankle is about the same surface looks healthy we have been using silver alginate under an Unna boot 4/10; area on the right medial ankle remains closed per the patient. We did not look at this wound. The wound on the left medial ankle is slightly larger. The patient complains that the [facility] caused burning pain all week. She also told us that she was a lot more active this week. Changed her back to silver alginate 4/17; right medial ankle still closed per the patient. Left medial ankle is slightly larger. Using silver alginate. She did not tolerate Hydrofera Blue on  this area 4/24; right medial ankle remains closed we have not look at this. The left medial ankle continues to get larger today by about a centimeter. We have been using silver alginate under Unna boots. She complains about 4 layer compression as an alternative. She has been up on her feet working on her garden 5/8; right medial ankle remains closed we did not look at this. The left medial ankle has increased in size about 100%. We have been using silver alginate under Unna boots.  She noted increased pain this week and was not surprised that the wound is deteriorated 5/15; no major change in SA however much less erythema ( one week of doxy ocellulitis). 5/22-64 year old female returns at 1 week to the clinic for left medial ankle wound for which we have been using silver alginate under 3 layer compression She was placed on DOXY at last visit - the wound is wider at this visit. She is in 3 layer compression 5/29; change to [facility] last week. I had given her empiric doxycycline 2 weeks ago for a week. She is in 3 layer compression. She complains of a lot of pain and drainage on presentation today. 6/5; using Hydrofera Blue. I gave her doxycycline recently empirically for erythema and pain around the wound. Believe her cultures showed enterococcus which not would not have been well covered by doxycycline nevertheless the wound looks better and I don't feel specifically that the enterococcus needs to be covered. She has a new what looks like a wrap injury on her lateral left ankle. 6/12; she is using Hydrofera Blue. She has a new area on the left anterior lower tibial area. This was a wrap injury last week. 6/19; the patient is using Hydrofera Blue. She arrived with marked inflammation and erythema around the wound and tenderness. 12/01/18 on evaluation today patient appears to be doing a little bit better based on what I'm hearing from the standpoint of lassos evaluation to this as far as the overall appearance of the wound is concerned. Then sometime substandard she typically sees Dr. Dellia Nims. Nonetheless overall very pleased with the progress that she's made up to this point. No fevers, chills, nausea, or vomiting noted at this time. 7/10; some improvement in the surface area. Aggressively debrided last week apparently. I went ahead with the debridement today although the patient does not tolerate this very well. We have been using Iodoflex. Still a fair amount of  drainage 7/17; slightly smaller. Using Iodoflex. 7/24; no change from last week in terms of surface area. We have been using Iodoflex. Surface looks and continues to look somewhat better 7/31; surface area slightly smaller better looking surface. We have been using Iodoflex. This is under Unna boot compression 8/7-Patient presents at 1 week with Unna boot and Iodoflex, wound appears better 8/14-Patient presents at 1 week with Iodoflex, we use the Unna boot, wound appears to be stable better.Patient is getting Botox treatment for the inversion of the foot for tendon release, Next week 8/21; we are using Iodoflex. Unna boot. The wound is stable in terms of surface area. Under illumination there is some areas of the wound that appear to be either epithelialized or perhaps this is adherent slough at this point I was not really clear. It did not wipe off and I was reluctant to debride this today. 8/28; we are using Iodoflex in an Unna boot. Seems to be making good improvement. 9/4; using Iodoflex and wound is slightly smaller. 9/18; we are using  Iodoflex with topical silver nitrate when she is here. The wound continues to be smaller 10/2; patient missed her appointment last week due to GI issues. She left and Iodoflex based dressing on for 2 weeks. Wound is about the same size about the size of a dime on the left medial lower 10/9 we have been using Iodoflex on the medial left ankle wound. She has a new superficial probable wrap injury on the dorsal left ankle 10/16; we have been using Hydrofera Blue since last week. This is on the left medial ankle 10/23; we have been using Hydrofera Blue since 2 weeks ago. This is on the left medial ankle. Dimensions are better 11/6; using Hydrofera Blue. I think the wound is smaller but still not closed. Left medial ankle 11/13; we have been using Hydrofera Blue. Wound is certainly no smaller this week. Also the surface not as good. This is the remanent of a  very large area on her left medial ankle. 11/20; using Sorbact since last week. Wound was about the same in terms of size although I was disappointed about the surface debris 12/11; 3-week follow-up. Patient was on vacation. Wound is measuring slightly larger we have been using Sorbact. 12/18; wound is about the same size however surface looks better last week after debridement. We have been using Sorbact under compression 1/15 wound is probably twice the size of last time increased in length nonviable surface. We have been using Sorbact. She was running a mild fever and missed her appointment last week 1/22; the wound is come down in size but under illumination still a very adherent debris we have been Hydrofera Blue that I changed her to last week 1/29; dimensions down slightly. We have been using Hydrofera Blue 2/19 dimensions are the same however there is rims of epithelialization under illumination. Therefore more the surface area may be epithelialized 2/26; the patient's wound actually measures smaller. The wound looks healthy. We have been using Hydrofera Blue. I had some thoughts about running Apligraf then I still may do that however this looks so much better this week we will delay that for now 3/5; the wound is small but about the same as last week. We have been using Hydrofera Blue. No debridement is required today. 3/19; the wound is about the size of a dime. Healthy looking wound even under illumination. We have been using Hydrofera Blue. No mechanical debridement is necessary 3/26; not much change from last week although still looks very healthy. We have been using Hydrofera Blue under Unna boots Patient was offered an ankle fusion by podiatry but not until the wound heals with a proceed with this. Objective Constitutional Sitting or standing Blood Pressure is within target range for patient.. Pulse regular and within target range for patient.Marland Kitchen Respirations regular,  non-labored and within target range.. Temperature is normal and within the target range for the patient.Marland Kitchen Appears in no distress. Vitals Time Taken: 1:09 PM, Height: 68 in, Weight: 127 lbs, BMI: 19.3, Temperature: 98.4 F, Pulse: 63 bpm, Respiratory Rate: 18 breaths/min, Blood Pressure: 122/57 mmHg. General Notes: Wound exam; left medial malleolus under illumination the wound looks healthy no mechanical debridement Anasept and gauze as usual. There is no surrounding erythema no edema Integumentary (Hair, Skin) Wound #3 status is Open. Original cause of wound was Gradually Appeared. The wound is located on the Left,Medial Malleolus. The wound measures 1.1cm length x 1.1cm width x 0.1cm depth; 0.95cm^2 area and 0.095cm^3 volume. There is Fat Layer (Subcutaneous Tissue) Exposed  exposed. There is no tunneling or undermining noted. There is a medium amount of serosanguineous drainage noted. The wound margin is flat and intact. There is large (67-100%) red, pink granulation within the wound bed. There is no necrotic tissue within the wound bed. 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 There was a Haematologist Compression Therapy Procedure by Deon Pilling, RN. Post procedure Diagnosis Wound #3: Same as Pre-Procedure Plan Follow-up Appointments: Return Appointment in 1 week. - nurse visit Dressing Change Frequency: Wound #3 Left,Medial Malleolus: Do not change entire dressing for one week. - change dressing in one week Skin Barriers/Peri-Wound Care: Moisturizing lotion TCA Cream or Ointment - mixed with lotion Wound Cleansing: Clean wound with Wound Cleanser - clean with anasept with dressing changes May shower with protection. Primary Wound  Dressing: Wound #3 Left,Medial Malleolus: Hydrofera Blue - classic Secondary Dressing: Wound #3 Left,Medial Malleolus: Dry Gauze Edema Control: Unna Boot to Left Lower Extremity - no kerlix layer Avoid standing for long periods of time Elevate legs to the level of the heart or above for 30 minutes daily and/or when sitting, a frequency of: - throughout the day Support Garment 20-30 mm/Hg pressure to: - compression stocking right leg daily 1. Hydrofera Blue under [employer] to continue [employer]) Signed: 08/27/2019 8:02:09 AM By: Linton Ham MD Entered By: Linton Ham on 08/26/2019 13:41:52 -------------------------------------------------------------------------------- SuperBill Details Patient Name: Date of Service: [name] 08/26/2019 Medical Record [id number] Patient Account Number: [id number] Date of Birth/Sex: Treating RN: Dec 08, 1955 (64 y.o. [name] Primary Care Provider: [name] Other Clinician: Referring Provider: Treating Provider/Extender:Arvis Miguez, Velva Harman, Essie Christine in Treatment: 350 Diagnosis Coding ICD-10 Codes Code Description [phone] Non-pressure chronic ulcer of left ankle with fat layer exposed L97.321 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 breakdown of skin Facility Procedures CPT4 Code: [id number] Description: 29580 - APPLY UNNA BOOT/PROFO BILATERAL Modifier: Quantity: 1 Physician Procedures CPT4 Code: [id number] Description: 00370 - WC PHYS LEVEL 3 - EST PT ICD-10 Diagnosis Description W88.891 Non-pressure chronic ulcer of left ankle with fat la Modifier: yer exposed Quantity: 1 Electronic Signature(s) Signed: 08/27/2019 8:02:09 AM By: Linton Ham MD Entered By: Linton Ham on 08/26/2019 13:42:12

## 2019-08-30 NOTE — Progress Notes (Signed)
HPI: 64 y.o. female presenting today as a new patient for evaluation of left ankle pain.  Patient has a history of a fall approximately 2 years ago.  She was seen by orthopedics, Dr. Doran Durand and Dr. Earleen Newport, for the left ankle pain.  Also at the time time she developed a wound to the medial aspect of her ankle.  She has been having the wound treated at wound care.  There is a chronic wound but there has been some improvement.  Patient states that her foot over the last 2 years has rolled inward and she constantly rolls her ankle and has pain.  She states that prior surgeons have declined to do any surgical intervention.  Patient states she is unable to go on like this and walk on her foot and the condition is currently in.  She presents today to discuss her treatment options  Past Medical History:  Diagnosis Date  . Allergy   . Ankle wound 02/2016   bilateral  . Dental bridge present    lower  . Dental crowns present    upper right  . Open wounds involving multiple regions of lower extremity   . Peripheral vascular disease (HCC)    venous stasis ulcers bilateral lower extremities  . PONV (postoperative nausea and vomiting)      Physical Exam: General: The patient is alert and oriented x3 in no acute distress.  Dermatology: Skin is warm, dry and supple bilateral lower extremities. Negative for open lesions or macerations.  Ulcer noted to the medial aspect of the left ankle approximately 2 cm in diameter.  Vascular: Palpable pedal pulses bilaterally. No edema or erythema noted. Capillary refill within normal limits.  Neurological: Epicritic and protective threshold grossly intact bilaterally.   Musculoskeletal Exam: Cavovarus contracture noted with inversion of the rear foot.  There is also contracture at the ankle joint consistent with an equinus type deformity.  Limited range of motion noted.  Radiographic Exam:  Osseous demineralization noted with contracture and almost  calcification/radiopacity outlining the Achilles tendon.  Cavovarus deformity noted left ankle.  There is also some disarticulation of the talonavicular joint.  Hammertoe contracture also noted digits 2-5 left.  Hallux abductovalgus deformity noted right foot with increased intermetatarsal angle greater than 15 degrees and hallux abductus angle greater than 40 degrees.  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  Bunion deformity right 4.  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 Patient evaluated. X-Rays reviewed in detail with the patient.  2.  After a lengthy discussion with the patient I do believe that the patient would benefit from surgical intervention.  Her foot is not in a position for conservative casting, and it is simply not functional in the current cavovarus position 3.  Today we discussed in detail her options both conservatively and surgically.  Surgically, she would require tibiotalarcalcaneal (TTC) fusion versus pantalar fusion.  Also an adjunctive tendo Achilles lengthening to alleviate pressure and pull from the calcified Achilles. *Recommend Integra versus Paragon 28 lateral fusion plate 4.  Prior to surgery I would like to see if we could have the wound care heal the wound to the medial aspect of the ankle.  Continue wound care. 5.  Return to clinic in 1 month for surgical consult and surgical planning 6.  Continue pain management at [facility] pain clinic      Edrick Kins, DPM [facility]  Dr. Edrick Kins, DPM    [address], [place] [address]                Office [phone]  Fax [phone]

## 2019-09-02 ENCOUNTER — Other Ambulatory Visit: Payer: Self-pay

## 2019-09-02 ENCOUNTER — Encounter (HOSPITAL_BASED_OUTPATIENT_CLINIC_OR_DEPARTMENT_OTHER): Payer: Medicare Other | Attending: Internal Medicine | Admitting: Internal Medicine

## 2019-09-02 DIAGNOSIS — S80812A Abrasion, left lower leg, initial encounter: Secondary | ICD-10-CM | POA: Insufficient documentation

## 2019-09-02 DIAGNOSIS — L97322 Non-pressure chronic ulcer of left ankle with fat layer exposed: Secondary | ICD-10-CM | POA: Insufficient documentation

## 2019-09-02 DIAGNOSIS — L97321 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 breakdown of skin: Secondary | ICD-10-CM | POA: Diagnosis not present

## 2019-09-02 DIAGNOSIS — X58XXXA Exposure to other specified factors, initial encounter: Secondary | ICD-10-CM | POA: Diagnosis not present

## 2019-09-02 DIAGNOSIS — Y93H9 Activity, other involving exterior property and land maintenance, building and construction: Secondary | ICD-10-CM | POA: Insufficient documentation

## 2019-09-05 NOTE — Progress Notes (Signed)
KLYNN, LINNEMANN ([id number]) Visit Report for 09/02/2019 SuperBill Details Patient Name: Date of Service: [name], [name] 09/02/2019 Medical Record [id number] Patient Account Number: [id number] Date of Birth/Sex: Treating RN: 03-14-1956 (64 y.o. [name] Primary Care Provider: [name] Other Clinician: Referring Provider: Treating Provider/Extender:Nashaly Dorantes, Tawny Asal in Treatment: 351 Diagnosis Coding ICD-10 Codes Code Description 7436930554 Non-pressure chronic ulcer of left ankle with fat layer exposed L97.321 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 breakdown of skin Facility Procedures CPT4 Code Description Modifier Quantity [id number] (Facility Use Only) [facility] Electronic Signature(s) Signed: 09/05/2019 7:44:34 AM By: Linton Ham MD Signed: 09/05/2019 4:55:48 PM By: Kela Millin Entered By: Kela Millin on 09/02/2019 13:42:23

## 2019-09-05 NOTE — Progress Notes (Signed)
[name], [name] ([id number]) Visit Report for 09/02/2019 Arrival Information Details Patient Name: Date of Service: [name], [name] 09/02/2019 10:00 AM Medical Record [id number] Patient Account Number: [id number] Date of Birth/Sex: Treating RN: 10-13-1955 (64 y.o. F) Kela Millin Primary Care Jerusalem Brownstein: [name] Other Clinician: Referring Shadow Stiggers: Treating Cassia Fein/Extender:Robson, Tawny Asal in Treatment: 351 Visit Information History Since Last Visit Cane Added or deleted any medications: No Patient Arrived: 10:06 Any new allergies or adverse reactions: No Arrival Time: Had a fall or experienced change in No Accompanied By: self None activities of daily living that may affect Transfer Assistance: risk of falls: Patient Identification Verified: Yes Signs or symptoms of abuse/neglect since last No Secondary Verification Process Completed: Yes visito Patient Requires Transmission-Based No Hospitalized since last visit: No Precautions: Implantable device outside of the clinic excluding No Patient Has Alerts: No cellular tissue based products placed in the center since last visit: Has Dressing in Place as Prescribed: Yes Has Compression in Place as Prescribed: Yes Pain Present Now: No Electronic Signature(s) Signed: 09/05/2019 4:55:48 PM By: Kela Millin Entered By: Kela Millin on 09/02/2019 10:08:40 -------------------------------------------------------------------------------- Compression Therapy Details Patient Name: Date of Service: [name] 09/02/2019 10:00 AM Medical Record [id number] Patient Account Number: [id number] Date of Birth/Sex: Treating RN: November 16, 1955 (64 y.o. [name] Primary Care Jeromiah Ohalloran: [name] Other Clinician: Referring Vaneta Hammontree: Treating Aviella Disbrow/Extender:Robson, Tawny Asal in Treatment: 351 Compression Therapy Performed for Wound Wound #3 Left,Medial  Malleolus Assessment: Performed By: Clinician Kela Millin, RN Compression Type: Rolena Infante Electronic Signature(s) Signed: 09/05/2019 4:55:48 PM By: Kela Millin Entered By: Kela Millin on 09/02/2019 10:15:57 -------------------------------------------------------------------------------- Encounter Discharge Information Details Patient Name: Date of Service: [name] 09/02/2019 10:00 AM Medical Record [id number] Patient Account Number: [id number] Date of Birth/Sex: Treating RN: 27-May-1956 (64 y.o. [name] Primary Care Sabena Winner: [name] Other Clinician: Referring Bryttani Blew: Treating Naveh Rickles/Extender:Robson, Tawny Asal in Treatment: 351 Encounter Discharge Information Items Discharge Condition: Stable Ambulatory Status: Cane Discharge Destination: Home Transportation: Private Auto Accompanied By: self Schedule Follow-up Appointment: Yes Clinical Summary of Care: Patient Declined Electronic Signature(s) Signed: 09/05/2019 4:55:48 PM By: Kela Millin Entered By: Kela Millin on 09/02/2019 10:26:26 -------------------------------------------------------------------------------- Patient/Caregiver Education Details Patient Name: Date of Service: [name], [name] G. 4/2/2021andnbsp10:00 AM Medical Record [id number] Patient Account Number: [id number] Date of Birth/Gender: Treating RN: August 13, 1955 (63 y.o. [name] Primary Care Physician: [name] Other Clinician: Referring Physician: Treating Physician/Extender:Robson, Tawny Asal in Treatment: 351 Education Assessment Education Provided To: Patient Education Topics Provided Wound/Skin Impairment: Methods: Explain/Verbal Responses: State content correctly Electronic Signature(s) Signed: 09/05/2019 4:55:48 PM By: Kela Millin Entered By: Kela Millin on 09/02/2019  10:25:58 -------------------------------------------------------------------------------- Wound Assessment Details Patient Name: Date of Service: [name] 09/02/2019 10:00 AM Medical Record [id number] Patient Account Number: [id number] Date of Birth/Sex: Treating RN: 1956/02/06 (64 y.o. [name] Primary Care Trust Crago: [name] Other Clinician: Referring Kelsa Jaworowski: Treating Osei Anger/Extender:Robson, Tawny Asal in Treatment: 351 Wound Status Wound Number: 3 Primary Venous Leg Ulcer Etiology: Wound Location: Left, Medial Malleolus Wound Open Wounding Event: Gradually Appeared Status: Date Acquired: 11/15/2012 Comorbid Congestive Heart Failure, Peripheral Vascular Weeks Of Treatment: 351 History: Disease, Congestive Heart Failure, End Clustered Wound: No Stage Renal Disease, Tobacco Use, Chronic Obstructive Pulmonary Disease (COPD), Type 1 Diabetes Wound Measurements Length: (cm) 1.1 % Reduct Width: (cm) 1.1 % Reduct Depth: (cm) 0.1 Epitheli Area: (cm) 0.95 Tunneli Volume: (cm) 0.095 Undermi Wound Description Classification: Full Thickness Without Exposed  Support Foul Od Structures Slough/ Wound Flat and Intact Margin: Exudate Medium Amount: Exudate Serosanguineous Type: Exudate red, brown Color: Wound Bed Granulation Amount: Large (67-100%) Granulation Quality: Red, Pink Fascia Necrotic Amount: None Present (0%) Fat Lay Tendon Muscle Joint E Bone Exposed or After Cleansing: No Fibrino No Exposed Structure Exposed: No er (Subcutaneous Tissue) Exposed: Yes Exposed: No Exposed: No xposed: No : No ion in Area: 69.8% ion in Volume: 84.9% alization: Small (1-33%) ng: No ning: No Treatment Notes Wound #3 (Left, Medial Malleolus) 1. Cleanse With Wound Cleanser Soap and water 2. Periwound Care Barrier cream TCA Ointment 3. Primary Dressing Applied Hydrofera Blue 4. Secondary Dressing Dry Gauze 6.  Support Layer Production assistant, radio Notes hydrofera classic. Electronic Signature(s) Signed: 09/05/2019 4:55:48 PM By: Kela Millin Entered By: Kela Millin on 09/02/2019 10:12:13 -------------------------------------------------------------------------------- Vitals Details Patient Name: Date of Service: [name] 09/02/2019 10:00 AM Medical Record [id number] Patient Account Number: [id number] Date of Birth/Sex: Treating RN: January 27, 1956 (64 y.o. [name] Primary Care Kaliana Albino: [name] Other Clinician: Referring Dael Howland: Treating Mixtli Reno/Extender:Robson, Tawny Asal in Treatment: 351 Vital Signs Time Taken: 10:05 Temperature (F): 97.9 Height (in): 68 Pulse (bpm): 67 Weight (lbs): 127 Respiratory Rate (breaths/min): 18 Body Mass Index (BMI): 19.3 Blood Pressure (mmHg): 115/54 Reference Range: 80 - 120 mg / dl Electronic Signature(s) Signed: 09/05/2019 4:55:48 PM By: Kela Millin Entered By: Kela Millin on 09/02/2019 10:10:55

## 2019-09-09 ENCOUNTER — Encounter (HOSPITAL_BASED_OUTPATIENT_CLINIC_OR_DEPARTMENT_OTHER): Payer: Medicare Other | Admitting: Internal Medicine

## 2019-09-09 ENCOUNTER — Other Ambulatory Visit: Payer: Self-pay

## 2019-09-09 DIAGNOSIS — L97322 Non-pressure chronic ulcer of left ankle with fat layer exposed: Secondary | ICD-10-CM | POA: Diagnosis not present

## 2019-09-12 ENCOUNTER — Encounter: Payer: Medicare Other | Attending: Physical Medicine & Rehabilitation | Admitting: Physical Medicine & Rehabilitation

## 2019-09-12 ENCOUNTER — Other Ambulatory Visit: Payer: Self-pay

## 2019-09-12 ENCOUNTER — Encounter: Payer: Self-pay | Admitting: Physical Medicine & Rehabilitation

## 2019-09-12 VITALS — BP 123/66 | HR 61 | Temp 97.5°F | Ht 68.0 in | Wt 140.0 lb

## 2019-09-12 DIAGNOSIS — R292 Abnormal reflex: Secondary | ICD-10-CM | POA: Diagnosis present

## 2019-09-12 DIAGNOSIS — R269 Unspecified abnormalities of gait and mobility: Secondary | ICD-10-CM | POA: Insufficient documentation

## 2019-09-12 DIAGNOSIS — M21542 Acquired clubfoot, left foot: Secondary | ICD-10-CM | POA: Diagnosis present

## 2019-09-12 DIAGNOSIS — G249 Dystonia, unspecified: Secondary | ICD-10-CM

## 2019-09-12 MED ORDER — GABAPENTIN 100 MG PO CAPS: 100.0000 mg | ORAL_CAPSULE | Freq: Every day | ORAL | 1 refills | Status: DC

## 2019-09-12 NOTE — Progress Notes (Signed)
Subjective:    Patient ID: [name], female    DOB: Oct 22, 1955, 64 y.o.   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    Last clinic visit on 07/26/2019.  She had Botox injection on last visit.  Since that time, pt states she saw Podiatrist regarding surgery.  She notes temporary benefit with Botox, but states over time her ankle appears to be getting worse and tightening.  She notes her foot is rolling more.   Pain Inventory Average Pain 7 Pain Right Now 6 My pain is constant and sharp  In the last 24 hours, has pain interfered with the following? General activity 9 Relation with others 5 Enjoyment of life 10 What TIME of day is your pain at its worst? morning, daytime  Sleep (in general) Fair  Pain is worse with: walking and standing Pain improves with: rest, heat/ice and medication Relief from Meds: 5  Mobility walk with assistance use a cane ability to climb steps?  no do you drive?  yes Do you have any goals in this area?  yes  Function disabled: date disabled . retired  Neuro/Psych trouble walking  Prior Studies Any changes since last visit?  no  Physicians involved in your care Any changes since last visit?  no   Family History  Problem Relation Age of Onset  . Hypertension Mother   . Lung cancer Father   . Diabetes Maternal Grandmother   . Diabetes Paternal Grandmother   . Healthy Brother    Social History   Socioeconomic History  . Marital status: Married    Spouse name: Not on file  . Number of children: 0  . Years of education: 70  . Highest education level: Not on file  Occupational History  . Occupation: retired Glass blower/designer  Tobacco Use  . Smoking status: Former Smoker    Packs/day: 0.25    Years: 26.00    Pack years: 6.50    Types: Cigarettes    Quit date: 04/25/2007    Years since quitting: 12.3  . Smokeless tobacco: Never Used  Substance  and Sexual Activity  . Alcohol use: Yes    Alcohol/week: 0.0 standard drinks    Comment: occasionally  . Drug use: No  . Sexual activity: Yes  Other Topics Concern  . Not on file  Social History Narrative   Patient lives with husband in a one story home. Has no children.  Took early retirement due to medical condition.  Formerly worked as a Glass blower/designer.  Education: 12th.     Social Determinants of Health   Financial Resource Strain:   . Difficulty of Paying Living Expenses:   Food Insecurity:   . Worried About Charity fundraiser in the Last Year:   . Arboriculturist in the Last Year:   Transportation Needs:   . Film/video editor (Medical):   Marland Kitchen Lack of Transportation (Non-Medical):   Physical Activity:   . Days of Exercise per Week:   . Minutes of Exercise per Session:   Stress:   . Feeling of Stress :   Social Connections:   . Frequency of Communication with Friends and Family:   . Frequency of Social Gatherings with Friends and Family:   . Attends Religious Services:   . Active Member of Clubs or Organizations:   . Attends Archivist Meetings:   Marland Kitchen Marital Status:  Past Surgical History:  Procedure Laterality Date  . APPLICATION OF WOUND VAC Bilateral 12/12/2015   Procedure: APPLICATION OF WOUND VAC;  Surgeon: [name], DO;  Location: [place];  Service: Plastics;  Laterality: Bilateral;  . DRESSING CHANGE UNDER ANESTHESIA Right 02/14/2016   Procedure: DRESSING CHANGE UNDER ANESTHESIA;  Surgeon: [name], DO;  Location: [name];  Service: Plastics;  Laterality: Right;  . Magnet  . I & D EXTREMITY Bilateral 12/12/2015   Procedure: DEBRIDEMENT OF BILATERAL LEG WOUNDS;  Surgeon: [name], DO;  Location: [place];  Service: Plastics;  Laterality: Bilateral;  . I & D EXTREMITY Bilateral 01/07/2016   Procedure: IRRIGATION AND DEBRIDEMENT OF BILATERAL  LEGS;  Surgeon: [name], DO;  Location: [place];  Service: Plastics;  Laterality: Bilateral;  . PERIPHERAL VASCULAR CATHETERIZATION N/A 10/15/2015   Procedure: Abdominal Aortogram w/Lower Extremity;  Surgeon: Angelia Mould, MD;  Location: [place] [facility];  Service: Cardiovascular;  Laterality: N/A;  . RADIOLOGY WITH ANESTHESIA N/A 02/24/2019   Procedure: MIR CERVICAL WITH AND WITHOUT CONTRAST;  Surgeon: Radiologist, Medication, MD;  Location: [place];  Service: Radiology;  Laterality: N/A;  . skin graph    . TONSILLECTOMY     as a child  . VEIN LIGATION AND STRIPPING Left late 1990's  . WOUND DEBRIDEMENT Left 02/14/2016   Procedure: SKIN GRAFT SPLIT THICKNESS TO LEFT ANKLE;  Surgeon: [name], DO;  Location: [place];  Service: Plastics;  Laterality: Left;   Past Medical History:  Diagnosis Date  . Allergy   . Ankle wound 02/2016   bilateral  . Dental bridge present    lower  . Dental crowns present    upper right  . Open wounds involving multiple regions of lower extremity   . Peripheral vascular disease (HCC)    venous stasis ulcers bilateral lower extremities  . PONV (postoperative nausea and vomiting)    BP 123/66   Pulse 61   Temp (!) 97.5 F (36.4 C)   Ht 5\' 8"  (1.727 m)   Wt 140 lb (63.5 kg)   SpO2 98%   BMI 21.29 kg/m   Opioid Risk Score:   Fall Risk Score:  `1  Depression screen PHQ 2/9  Depression screen [facility] 2/9 05/18/2017 04/27/2017 04/07/2017  Decreased Interest 0 0 1  Down, Depressed, Hopeless 0 0 1  PHQ - 2 Score 0 0 2  Altered sleeping - - 2  Tired, decreased energy - - 1  Change in appetite - - 1  Feeling bad or failure about yourself  - - 1  Trouble concentrating - - 1  Moving slowly or fidgety/restless - - 1  Suicidal thoughts - - 0  PHQ-9 Score - - 9  Difficult doing work/chores - - Not difficult at all   Review of Systems  Constitutional: Negative.   HENT: Negative.   Eyes:  Negative.   Respiratory: Negative.   Cardiovascular: Negative.   Gastrointestinal: Negative.   Endocrine: Negative.   Genitourinary: Negative.   Musculoskeletal: Positive for gait problem.       Joint deformity  Skin: Negative.   Allergic/Immunologic: Negative.   Hematological: Negative.   Psychiatric/Behavioral: Negative.       Objective:   Physical Exam  Constitutional: NAD. Respiratory: Normal effort.  No stridor. Skin: RLE with coban dressing Psych: Normal mood.  Normal behavior. 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    1. Left equinovarus with gait abnormality  Continue recs per Dr. Alveta Heimlich  Previously Botox:    Left Med Gastroc:       200   Left Soleus:                 100   Left FHL:                     200  Will consider continuing with Botox after eval from Podiatrist.  Patient has had improvement with ROM and tightness overall, but limited with eversion, which is contracted

## 2019-09-12 NOTE — Addendum Note (Signed)
Addended by: Delice Lesch A on: 09/12/2019 10:24 AM   Modules accepted: Orders

## 2019-09-12 NOTE — Progress Notes (Signed)
THURZA, KWIECINSKI ([id number]) Visit Report for 09/09/2019 HPI Details Patient Name: Date of Service: TALASIA, SAULTER 09/09/2019 9:45 AM Medical Record [id number] Patient Account Number: [id number] Date of Birth/Sex: Treating RN: 11-02-1955 (64 y.o. [name] Primary Care Provider: [name] Other Clinician: Referring Provider: Treating Provider/Extender:Shristi Scheib, Tawny Asal in Treatment: 352 History of Present Illness HPI Description: the remaining wound is over the left medial ankle. Similar wound over the right medial ankle healed largely with use of Apligraf. Most recently we have been using Hydrofera Blue over this wound with considerable improvement. The patient has been extensively worked up in the past for her venous insufficiency and she is not a candidate for antireflux surgery although I have none of the details available currently. 08/24/14; considerable improvement today. About 50% of this wound areas now epithelialized. The base of the wound appears to be healthier granulation.as opposed to last week when she had deteriorated a considerable improvement 08/17/14; unfortunately the wound has regressed somewhat. The areas of epithelialization from the superior aspect are not nearly as healthy as they were last week. The patient thinks her Hydrofera Blue slipped. 09/07/14; unfortunately the area has markedly regressed in the 2 weeks since I've seen this. There is an odor surrounding erythema. The healthy granulation tissue that we had at the base of the wound now is a dusky color. The nurse reports green drainage 09/14/14; the area looks somewhat better than last week. There is less erythema and less drainage. The culture I did did not show any growth. Nevertheless I think it is better to continue the Cipro and doxycycline for a further week. The remaining wound area was debridement. 09/21/14. Wound did not require debridement last week. Still less  erythema and less drainage. She can complete her antibiotics. The areas of epithelialization in the superior aspect of the wound do not look as healthy as they did some weeks ago 10/05/14 continued improvement in the condition of this wound. There is advancing epithelialization. Less aggressive debridement required 10/19/14 continued improvement in the condition and volume of this wound. Less aggressive debridement to the inferior part of this to remove surface slough and fibrinous eschar 11/02/14 no debridement is required. The surface granulation appears healthy although some of her islands of epithelialization seem to have regressed. No evidence of infection 11/16/14; lites surface debridement done of surface eschar. The wound does not look to be unhealthy. No evidence of infection. Unfortunately the patient has had podiatry issues in the right foot and for some reason has redeveloped small surface ulcerations in the medial right ankle. Her original presentation involved wounds in this area 11/23/14 no debridement. The area on the right ankle has enlarged. The left ankle wound appears stable in terms of the surface although there is periwound inflammation. There has been regression in the amount of new skin 11/30/14 no debridement. Both wound areas appear healthy. There was no evidence of infection. The the new area on the right medial ankle has enlarged although that both the surfaces appear to be stable. 12/07/14; Debridement of the right medial ankle wound. No no debridement was done on the left. 12/14/14 no major change in and now bilateral medial ankle wounds. Both of these are very painful but the no overt evidence of infection. She has had previous venous ablation 12/21/14; patient states that her right medial ankle wound is considerably more painful last week than usual. Her left is also somewhat painful. She could not tolerate debridement. The right medial ankle  wound has fibrinous  surface eschar 12/28/14 this is a patient with severe bilateral venous insufficiency ulcers. For a considerable period of time we actually had the one on the right medial ankle healed however this recently opened up again in June. The left medial ankle wound has been a refractory area with some absent flows. We had some success with Hydrofera Blue on this area and it literally closed by 50% however it is recently opened up Foley. Both of these were debridement today of surface eschar. She tolerates this poorly 01/25/15: No change in the status of this. Thick adherent escar. Very poor tolerance of any attempt at debridement. I had healed the right medial malleolus wound for a considerable amount of time and had the left one down to about 50% of the volume although this is totally regressed over the last 48 weeks. Further the right leg has reopened. she is trying to make a appointment with pain and vascular, previous ablations with Dr. Aleda Grana. I do not believe there is an arterial insufficiency issue here 02/01/15 the status of the adherent eschar bilaterally is actually improved. No debridement was done. She did not manage to get vascular studies done 02/08/15 continued debridement of the area was done today. The slough is less adherent and comes off with less pressure. There is no surrounding infection peripheral pulses are intact 02/15/15 selective debridement with a disposable curette. Again the slough is less adherent and comes off with less difficulty. No surrounding infection peripheral pulses are intact. 02/22/15 selective debridement of the right medial ankle wound. Slough comes off with less difficulty. No obvious surrounding infection peripheral pulses are intact I did not debridement the one on the left. Both of these are stable to improved 03/01/15 selective debridement of both wound areas using a curette to. Adherent slough cup soft with less difficulty. No obvious surrounding  infection. The patient tells me that 2 days ago she noted a rash above the right leg wrap. She did not have this on her lower legs when she change this over she arrives with widespread left greater than right almost folliculitis-looking rash which is extremely pruritic. I don't see anything to culture here. There is no rash on the rest of her body. She feels well systemically. 03/08/15; selective debridement of both wounds using a curette. Base of this does not look unhealthy. She had limegreen drainage coming out of the left leg wound and describes a lot of drainage. The rash on her left leg looks improved to. No cultures were done. 03/22/15; patient was not here last week. Basal wounds does not look healthy and there is no surrounding erythema. No drainage. There is still a rash on the left leg that almost looks vasculitic however it is clearly limited to the top of where the wrap would be. 04/05/15; on the right required a surgical debridement of surface eschar and necrotic subcutaneous tissue. I did not debridement the area on the left. These continue to be large open wounds that are not changing that much. We were successful at one point in healing the area on the right, and at the same time the area on the left was roughly half the size of current measurements. I think a lot of the deterioration has to do with the prolonged time the patient is on her feet at work 04/19/15 I attempted-like surface debridement bilaterally she does not tolerate this. She tells me that she was in allergic care yesterday with extreme pain over her left lateral  malleolus/ankle and was told that she has an "sprain" 05/03/15; large bilateral venous insufficiency wounds over the medial malleolus/medial aspect of her ankles. She complains of copious amounts of drainage and his usual large amounts of pain. There is some increasing erythema around the wound on the right extending into the medial aspect of her foot to.  historically she came in with these wounds the right one healed and the left one came down to roughly half its current size however the right one is reopened and the left is expanded. 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 05/10/15 large bilateral venous insufficiency wounds. There is less adherence surface left however the surface culture that I did last week grew pseudomonas therefore bilateral selective debridement score necessary. There is surrounding erythema. The patient describes severe bilateral drainage and a lot of pain in the left ankle. Apparently her podiatrist was were ready to do a cortisone shot 05/17/15; the patient complains of pain and again copious amounts of drainage. 05/24/15; we used Iodo flex last week. Patient notes considerable improvement in wound drainage. Only needed to change this once. 05/31/15; we continued Iodoflex; the base of these large wounds bilaterally is not too bad but there is probably likely a significant bioburden here. I would like to debridement just doesn't tolerate it. 06/06/14 I would like to continue the Iodoflex although she still hasn't managed to obtain supplies. She has bilateral medial malleoli or large wounds which are mostly superficial. Both of them are covered circumferentially with some nonviable fibrinous slough although she tolerates debridement very poorly. She apparently has an appointment for an ablation on the right leg by interventional radiology. 06/14/15; the patient arrives with the wounds and static condition. We attempted a debridement although she does not do well with this secondary to pain. I 07/05/15; wounds are not much smaller however there appears to be a cleaner granulating base. The left has tight fibrinous slough greater than the right. Debridement is tolerated poorly due to pain. Iodoflex is done more for these wounds in any of the multitude of different dressings I  have tried on the left 1 and then subsequently the right. 07/12/15; no change in the condition of this wound. I am able to do an aggressive debridement on the right but not the left. She simply cannot tolerate it. We have been using Iodoflex which helps somewhat. It is worthwhile remembering that at one point we healed the right medial ankle wound and the left was about 25% of the current circumference. We have suggested returning to vascular surgery for review of possible further ablations for one reason or another she has not been able to do this. 07/26/15 no major change in the condition of either wound on her medial ankle. I did not attempt to debridement of these. She has been aggressively scrubbing these while she is in the shower at home. She has her supply of Iodoflex which seems to have done more for these wounds then anything I have put on recently. 08/09/15 wound area appears larger although not verified by measurements. Using Iodoflex 09/05/2015 -- she was here for avisit today but had significant problems with the wound and I was asked to see her for a physician opinion. I have summarize that this lady has had surgery on her left lower extremity about 10 years ago where the possible veins stripping was done. She has had an opinion from interventional radiology  around November 2016 where no further sclerotherapy was ordered. The patient works 12 hours a day and stands on a concrete floor with work boots and is unable to get the proper compression she requires and cannot elevate her limbs appropriately at any given time. She has recently grown Pseudomonas from her wound culture but has not started her ciprofloxacin which was called in for her. 09/13/15 this continues to be a difficult situation for this patient. At one point I had this wound down to a 1.5 x 1.5" wound on her left leg. This is deteriorated and the right leg has reopened. She now has substantial wounds on her medial calcaneus,  malleoli and into her lower leg. One on the left has surface eschar but these are far too painful for me to debridement here. She has a vascular surgery appointment next week to see if anything can be done to help here. I think she has had previous ablations several years ago at [facility]. She has no major edema. She tells me that she did not get product last time [facility]] and went for several days without it. She continues to work in work boots 12 hours a day. She cannot get compression/4-layer under her work boots. 09/20/15 no major change. Periwound edema control was not very good. Her point with pain and vascular is next Wednesday the 25th 09/28/15; the patient is seen vascular surgery and is apparently scheduled for repeat duplex ultrasounds of her bilateral lower legs next week. 10/05/15; the patient was seen by Dr. Doren Custard of vascular surgery. He feels that she should have arterial insufficiency excluded as cause/contributed to her nonhealing stage she is therefore booked for an arteriogram. She has apparently monophasic signals in the dorsalis pedis pulses. She also of course has known severe chronic venous insufficiency with previous procedures as noted previously. I had another long discussion with the patient today about her continuing to work 12 hour shifts. I've written her out for 2 months area had concerns about this as her work location is currently undergoing significant turmoil and this may lead to her termination. She is aware of this however I agree with her that she simply cannot continue to stand for 12 hours multiple days a week with the substantial wound areas she has. 10/19/15; the Dr. Doren Custard appointment was largely for an arteriogram which was normal. She does not have an arterial issue. He didn't make a comment about her chronic venous insufficiency for which she has had previous ablations. Presumably it was not felt that anything additional could be done. The patient is  now out of work as I prescribed 2 weeks ago. Her wounds look somewhat less aggravated presumably because of this. I felt I would give debridement another try today 10/25/15; no major change in this patient's wounds. We are struggling to get her product that she can afford into her own home through her insurance. 11/01/15; no major change in the patient's wounds. I have been using silver alginate as the most affordable product. I spoke to Dr. Marla Roe last week with her requested take her to the OR for surgical debridement and placement of ACEL. Dr. Marla Roe told me that she would be willing to do this however [facility] will not cover this, fortunately the patient has [place] healthcare of some variant 11/08/15; no major change in the patient's wounds. She has been completely nonviable surface that this but is in too much pain with any attempted debridement are clinic. I have  arranged for her to see Dr. Marla Roe ham of plastic surgery and this appointment is on Monday. I am hopeful that they will take her to the OR for debridement, possible ACEL ultimately possible skin graft 11/22/15 no major change in the patient's wounds over her bilateral medial calcaneus medial malleolus into the lower legs. Surface on these does not look too bad however on the left there is surrounding erythema and tenderness. This may be cellulitis or could him sleepy tinea. 11/29/15; no major changes in the patient's wounds over her bilateral medial malleolus. There is no infection here and I don't think any additional antibiotics are necessary. There is now plan to move forward. She sees Dr. Marla Roe in a week's time for preparation for operative debridement and ACEL placement I believe on 7/12. She then has a follow-up appointment with Dr. Marla Roe on 7/21 12/28/15; the patient returns today having been taken to the [place] by Dr. Marla Roe 12/12/15 she underwent debridement, intraoperative cultures [which were  negative]. She had placement of a wound VAC. Parent really ACEL was not available to be placed. The wound VAC foam apparently adhered to the wound since then she's been using silver alginate, Xeroform under Ace wraps. She still says there is a lot of drainage and a lot of pain 01/31/16; this is a patient I see monthly. I had referred her to Dr. Marla Roe him of plastic surgery for large wounds on her bilateral medial ankles. She has been to the OR twice once in early July and once in early August. She tells me over the last 3 weeks she has been using the wound VAC with ACEL underneath it. On the right we've simply been using silver alginate. Under Kerlix Coban wraps. 02/28/16; this is a patient I'm currently seeing monthly. She is gone on to have a skin graft over her large venous insufficiency ulcer on the left medial ankle. This was done by Dr. Marla Roe him. The patient is a bit perturbed about why she didn't have one on her right medial ankle wound. She has been using silver alginate to this. 03/06/16; I received a phone call from her plastic surgery Dr. Marla Roe. She expressed some concern about the viability of the skin graft she did on the left medial ankle wound. Asked me to place Endoform on this. She told me she is not planning to do a subsequent skin graft on the right as the left one did not take very well. I had placed Hydrofera Blue on the right 03/13/16; continue to have a reasonably healthy wound on the right medial ankle. Down to 3 mm in terms of size. There is epithelialization here. The area on the left medial ankle is her skin graft site. I suppose the last week this looks somewhat better. She has an open area inferiorly however in the center there appears to be some viable tissue. There is a lot of surface callus and eschar that will eventually need to come off however none of this looked to be infected. Patient states that the is able to keep the dressing on for several days  which is an improvement. 03/20/16 no major change in the circumference of either wound however on the left side the patient was at Dr. Eusebio Friendly office and they did a debridement of left wound. 50% of the wound seems to be epithelialized. I been using Endoform on the left Hydrofera Blue in the right 03/27/16; she arrives today with her wound is not looking as healthy as they did  last week. The area on the right clearly has an adherent surface to this a very similar surface on the left. Unfortunately for this patient this is all too familiar problem. Clearly the Endoform is not working and will need to change that today that has some potential to help this surface. She does not tolerate debridement in this clinic very well. She is changing the dressing wants 04/03/16; patient arrives with the wounds looking somewhat better especially on the right. Dr. Migdalia Dk change the dressing to silver alginate when she saw her on Monday and also sold her some compression socks. The usefulness of the latter is really not clear and woman with severely draining wounds. 04/10/16; the patient is doing a bit of an experiment wearing the compression stockings that Dr. Migdalia Dk provided her to her left leg and the out of legs based dressings that we provided to the right. 05/01/16; the patient is continuing to wear compression stockings Dr. Migdalia Dk provided her on the left that are apparently silver impregnated. She has been using Iodoflex to the right leg wound. Still a moderate amount of drainage, when she leaves here the wraps only last for 4 days. She has to change the stocking on the left leg every night 05/15/16; she is now using compression stockings bilaterally provided by Dr. Marla Roe. She is wearing a nonadherent layer over the wounds so really I don't think there is anything specific being done to this now. She has some reduction on the left wound. The right is stable. I think all healing here is being done  without a specific dressing 06/09/16; patient arrives here today with not much change in the wound certainly in diameter to large circular wounds over the medial aspect of her ankle bilaterally. Under the light of these services are certainly not viable for healing. There is no evidence of surrounding infection. She is wearing compression stockings with some sort of silver impregnation as prescribed by Dr. Marla Roe. She has a follow-up with her tomorrow. 06/30/16; no major change in the size or condition of her wounds. These are still probably covered with a nonviable surface. She is using only her purchase stockings. She did see Dr. Marla Roe who seemed to want to apply Dakin's solution to this I'm not extreme short what value this would be. I would suggest Iodoflex which she still has at home. 07/28/16; I follow Mrs. Desrosier episodically along with Dr. Marla Roe. 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 She has been applying a topical collagen ointment to both wounds with Adaptic. I don't think Dr. Marla Roe is planning to take her back to the OR. 08/19/16; I follow Mrs. Jeneen Rinks on a monthly basis along with Dr. Marla Roe of plastic surgery. 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 I been following her for a number of years. At one point I was able to get the right medial malleolus wound to heal and had the left medial malleolus down to about half its current size however and I had to send her to plastic surgery for an operative debridement. Since then things have been stable to slightly improve the area on the right is slightly better one in the left about the same although there is much less adherent surface than I'm used to with this patient. She is using some form of liquid collagen gel that Dr. Marla Roe provided a Kerlix cover with the patient's own  pressure stockings. She tells me that she has extreme pain in both ankles and along the lateral aspect of both feet. She has been unable to work for some period of time. She is telling me she is retiring at the beginning of April. She sees Dr. Doran Durand of orthopedics next week 09/22/16; patient has not seen Dr. Marla Roe since the last time she is here. I'm not really sure what she is using to the wounds other than bits and pieces of think she had left over including most recently Hydrofera Blue. She is using juxtalite stockings. She is having difficulty with her husband's recent illness "stroke". She is having to transport him to various doctors appointments. Dr. Marla Roe left her the option of a repeat debridement with ACEL however she has not been able to get the time to follow-up on this. She continues to have a fair amount of drainage out of these wounds with certainly precludes leaving dressings on all week 10/13/16; patient has not seen Dr. Marla Roe since she was last in our clinic. I'm not really sure what she is doing with the wounds, we did try to get her [facility] and I think she is actually using this most of the time. Because of drainage she states she has to change this every second day although this is an improvement from what she used to do. She went to see Dr. Doran Durand who did not think she had a muscular issue with regards to her feet, he referred her to a neurologist and I think the appointment is sometime in June. I changed her back to Iodoflex which she has used in the past but not recently. 11/03/16; the patient has been using Iodoflex although she ran out of this. Still claims that there is a lot of drainage although the wound does not look like this. No surrounding erythema. She has not been back to see Dr. Marla Roe 11/24/16; the patient has been using Iodoflex again but she ran out of it 2 or 3 days ago. There is no major change in the condition of either one of these  wounds in fact they are larger and covered in a thick adherent surface slough/nonviable tissue especially on the left. She does not tolerate mechanical debridement in our clinic. Going back to see Dr. Marla Roe of plastic surgery for an operative debridement would seem reasonable. 12/15/16; the patient has not been back to see Dr. Marla Roe. She is been dealing with a series of illnesses and her husband which of monopolized her time. She is been using Sorbact which we largely supplied. She states the drainage is bad enough that it maximum she can go 2-3 days without changing the dressing 01/12/2017 -- the patient has not been back for about 4 weeks and has not seen Dr. Marla Roe not does she have any appointment pending. 01/23/17; patient has not seen Dr. Marla Roe even though I suggested this previously. She is using Santyl that was suggested last week by Dr. Con Memos this Cost her $16 through her insurance which is indeed surprising 02/12/17; continuing Santyl and the patient is changing this daily. A lot of drainage. She has not been back to see plastic surgery she is using an Ace wrap. Our intake nurse suggested wrap around stockings which would make a good reasonable alternative 02/26/17; patient is been using Santyl and changing this daily due to drainage. She has not been to see plastic surgery she uses in April Ace wrap to control the edema. She did obtain extremitease stockings  but stated that the edema in her leg was to big for these 03/20/17; patient is using Santyl and Anasept. Surfaces looked better today the area on the right is actually measuring a little smaller. She has states she has a lot of pain in her feet and ankles and is asking for a consult to pain control which I'll try to help her with through our case manager. 04/10/17; the patient arrives with better-looking wound surfaces and is slightly smaller wound on the left she is using a combination of Santyl and Anasept. She has  an appointment or at least as started in the pain control center associated with [facility] 05/14/17; this is a patient who I followed for a prolonged period of time. 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 At one point I had this down to a much smaller wound on the left however these reopened and we've never been able to get these to heal. She has been using Santyl and Anasept gel although 2 weeks ago she ran out of the Anasept gel. She has a stable appearance of the wound. She is going to the wound care clinic at [facility]. They wanted do a nerve block/spinal block although she tells me she is reluctant to go forward with that. 05/21/17; this is a patient I have followed for many years. 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 Chronic pain and deformity in her ankles as well. She is been to see plastic surgery as well as orthopedics. Using PolyMem AG most recently/Kerramax/ABDs and 2 layer compression. She has managed to keep this on and she is coming in for a nurse check to change the dressing on Tuesdays, we see her on Fridays 06/05/17; really quite a good looking surface and the area especially on the right medial has contracted in terms of dimensions. Well granulated healthy-looking tissue on both sides. Even with an open curet there is nothing that even feels abnormal here. This is as good as I've seen this in quite some time. We have been using PolyMem AG and bringing her in for a nurse check 06/12/17; really quite good surface on both of these wounds. The right medial has contracted a bit left is not. We've been using PolyMem and AG and she is coming in for a nurse visit 06/19/17; we have been using PolyMem AG and bringing her in for a nurse check. Dimensions of her wounds are not better but the surfaces looked better bilaterally. She complained of bleeding last night and the left wound and increasing pain bilaterally. She  states her wound pain is more neuropathic than just the wounds. There was some suggestion that this was radicular from her pain management doctor in talking to her it is really difficult to sort this out. 06/26/17; using PolyMem and AG and bringing her in for a nurse check as All of this and reasonably stable condition. Certainly not improved. The dimensions on the lateral part of the right leg look better but not really measuring better. The medial aspect on the left is about the same. 07/03/16; we have been using PolyMen AG and bringing her in for a nurse check to change the dressings as the wounds have drainage which precludes once weekly changing. We are using all secondary absorptive dressings.our intake nurse is brought up the idea of using a wound VAC/snap VAC on the wound to help with the drainage to see if this would result in some contraction. This is  not a bad idea. The area on the right medial is actually looking smaller. Both wounds have a reasonable-looking surface. There is no evidence of cellulitis. The edema is well controlled 07/10/17; the patient was denied for a snap VAC by her insurance. The major issue with these wounds continues to be drainage. We are using wicked PolyMem AG and she is coming in for a nurse visit to change this. The wounds are stable to slightly improved. The surface looks vibrant and the area on the right certainly has shrunk in size but very slowly 07/17/17; the patient still has large wounds on her bilateral medial malleoli. Surface of both of these wounds looks better. The dimensions seem to come and go but no consistent improvement. There is no epithelialization. We do not have options for advanced treatment products due to insurance issues. They did not approve of the wound VAC to help control the drainage. More recently we've been using PolyMem and AG wicked to allow drainage through. We have been bringing her in for a nurse visit to change this. We do not  have a lot of options for wound care products and the home again due to insurance issues 07/24/17; the patient's wound actually looks somewhat better today. No drainage measurements are smaller still healthy-looking surface. We used silver collagen under PolyMen started last week. We have been bringing her in for a dressing change 07/31/17; patient's wound surface continued to look better and I think there is visible change in the dimensions of the wound on the right. Rims of epithelialization. We have been using silver collagen under PolyMen and bringing her in for a dressing change. There appears to be less drainage although she is still in need of the dressing change 08/07/17. Patient's wound surface continues to look better on both sides and the area on the right is definitely smaller. We have been using silver collagen and PolyMen. She feels that the drainage has been it has been better. I asked her about her vascular status. She went to see Dr. Aleda Grana at [facility] and had some form of ablation. I don't have much detail on this. I haven't my notes from 2016 that she was not a candidate for any further ablation but I don't have any more information on this. We had referred her to vein and vascular I don't think she ever went. He does not have a history of PAD although I don't have any information on this either. We don't even have ABIs in our record 08/14/17; we've been using silver collagen and PolyMen cover. And putting the patient and compression. She we are bringing her in as a nurse visit to change this because ofarge amount of drainage. We didn't the ABIs in clinic today since they had been done in many moons 1.2 bilaterally. She has been to see vein and vascular however this was at [facility] and she had ablation although I really don't have any information on this all seemed biking get a report. She is also been operatively debrided by plastic surgery and had a cell placed  probably 8-12 months ago. This didn't have a major effect. We've been making some gains with current dressings 08/19/17-She is here in follow-up evaluation for bilateral medial malleoli ulcers. She continues to tolerate debridement very poorly. We will continue with recently changed topical treatment; if no significant improvement may consider switching to Iodosorb/Iodoflex. 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 She has been using silver  collagen and PolyMem. I believe she has been used and tried on Iodoflex before. 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 These have been very refractory over time. We have been using silver collagen and PolyMen. She is coming in once a week for a doctor's and once a week for nurse visits. We are actually making some progress 09/18/17; the patient's wounds are smaller especially on the right medial. She arrives today to upset to consider even washing these off with Anasept which I think is been part of the reason this is been closing. We've been using collagen covered in PolyMen otherwise. It is noted that she has a small area of folliculitis on the right medial calf that. As we are wrapping her legs I'll give her a short course of doxycycline to make sure this doesn't amount to anything. She is a long list of complaints today including imbalance, shortness of breath on exertion, inversion of her left ankle. With regards to the latter complaints she is been to see orthopedics and they offered her a tendon release surgery I believe but wanted her wounds to be closed first. I have recommended she go see her primary physician with regards to everything else. 09/25/17; patient's wounds are about the same size. We have made  some progress bilaterally although not in recent weeks. She will not allow me T wash these wounds with Anasept even if she is doing her cell. Wheeze we've been using collagen covered in PolyMen. Last week she had a small area of folliculitis this is now opened into a small wound. She completed 5 days of trimethoprim sulfamethoxazole 10/02/17; unfortunately the area on her left medial ankle is worse with a larger wound area towards the Achilles. The patient complains of a lot of pain. She will not allow debridement although visually I don't think there is anything to debridement in any case. We have been using silver collagen and PolyMen for several months now. Initially we are making some progress although I'm not really seeing that today. We will move back to [facility]. His admittedly this is a bit of a repeat however I'm hoping that his situation is different now. The patient tells me she had her leg on the left give out on her yesterday this is process some pain. 10/09/17; the patient is seen twice a week largely because of drainage issues coming out of the chronic medial bimalleolar wounds that are chronic. Last week the dimensions of the one on the left looks a little larger I changed her to [facility]. She comes in today with a history of terrible pain in the bilateral wound areas. She will not allow debridement. She will not even allow a tissue culture. There is no surrounding erythema no no evidence of cellulitis. We have been putting her Kerlix Coban man. She will not allow more aggressive compression as there was a suggestion to put her in 3 layer wraps. 10/16/17; large wounds on her bilateral medial malleoli. These are chronic. Not much change from last week. The surface looks have healthy but absolutely no epithelialization. A lot of pain little less so of drainage. She will not allow debridement or even washing these off in the vigorous fashion with Anasept. 10/23/17; large wounds  on her bilateral malleoli which are chronic. Some improvement in terms  of size perhaps on the right since last time I saw these. She states that after we increased the 3 layer compression there was some bleeding, when she came in for a nurse visit she did not want 3 layer compression put back on about our nurse managed to convince her. She has known chronic venous visit issues and I'm hoping to get her to tolerate the 3 layer compression. using Hydrofera Blue 10/30/17; absolutely no change in the condition of either wound although we've had some improvement in dimensions on the right.. Attempted to put her in 3 layer compression she didn't tolerated she is back in 2 layer compression. We've been using Hydrofera Blue We looked over her past records. She had venous reflux studies in November 2016. There was no evidence of deep venous reflux on the right. Superficial vein did not show the greater saphenous vein at think this is been previously ablated the small saphenous vein was within normal limits. The left deep venous system showed no DVT the vessels were positive for deep venous reflux in the posterior tibial veins at the ankle. The greater saphenous vein was surgically absent small saphenous vein was within normal limits. She went to vein and vascular at [facility]. I believe she had an ablation on the left greater saphenous vein. I'll update her reflux studies perhaps ever reviewed by vein and vascular. We've made absolutely no progress in these wounds. Will also try to read and TheraSkins through her insurance 11/06/17; W the patient apparently has a 2 week follow-up with vein and vascular I like him to review the whole issue with regards to her previous vascular workup by Dr. Aleda Grana. We've really made no progress on these wounds in many months. She arrives today with less viable looking surface on the left medial ankle wound. This was apparently looking about the same on Tuesday when  she was here for nurse visit. 11/13/17; deep tissue culture I did last time of the left lower leg showed multiple organisms without any predominating. In particular no Staphylococcus or group A strep were isolated. We sent her for venous reflux studies. She's had a previous left greater saphenous vein stripping and I think sclerotherapy of the right greater saphenous vein. She didn't really look at the lesser saphenous vein this both wounds are on the medial aspect. She has reflux in the common femoral vein and popliteal vein and an accessory vein on the right and the common femoral vein and popliteal vein on the left. I'm going to have her go to see vein and vascular just the look over things and see if anything else beside aggressive compression is indicated here. We have not been able to make any progress on these wounds in spite of the fact that the surface of the wounds is never look too bad. 11/20/17; no major change in the condition of the wounds. Patient reports a large amount of drainage. She has a lot of complaints of pain although enlisting her today I wonder if some of this at least his neuropathic rather than secondary to her wounds. She has an appointment with vein and vascular on 12/30/17. The refractory nature of these wounds in my mind at least need vein and vascular to look over the wounds the recent reflux studies we did and her history to see if anything further can be done here. I also note her gait is deteriorated quite a bit. 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 She has  a bilateral Trendelenburg gait. I wonder if this is neuropathic or perhaps multilevel radicular. 11/27/17; her wounds actually looks slightly better. Healthy-looking granulation tissue a scant amount of epithelialization. [place] healthcare will not pay for [employer]. They will play for tri layer Oasis and Dermagraft. This is not a diabetic ulcer. We'll try for the tri layer Oasis. She still  complains of some drainage. She has a vein and vascular appointment on 12/30/17 12/04/17; the wounds visually look quite good. Healthy-looking granulation with some degree of epithelialization. We are still waiting for response to our request for trial to try layer Oasis. Her appointment with vascular to review venous and arterial issues isn't sold the end of July 7/31. Not allow debridement or even vigorous cleansing of the wound surface. 12/18/17; slightly smaller especially on the right. 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 We've been using Hydrofera Blue. We still are looking into triple layer Oasis through her insurance 01/08/18 on evaluation today patient's wound actually appears to be showing signs of good improvement at this point in time. She has been tolerating the dressing changes without complication. Fortunately there does not appear to be any evidence of infection at this point in time. We have been utilizing silver nitrate which does seem to be of benefit for her which is also good news. Overall I'm very happy with how things seem to be both regards appearance as well as measurement. Patient did see Dr. Bridgett Larsson for evaluation on 12/30/17. In his assessment he felt that stripping would not likely add much more than chronic compression to the patient's healing process. His recommendation was to follow-up in three months with Dr. Doren Custard if she hasn't healed in order to consider referral back to you and see vascular where she previously was in a trial and was able to get her wound to heal. I'll be see what she feels she when you staying compression and he reiterated this as well. 01/13/18 on evaluation today patient appears to actually be doing very well in regard to her bilateral medial malleolus ulcers. She seems to have tolerated the chemical cauterization with silver nitrate last week she did have some pain through that evening but fortunately states that I'll  be see since it seems to be doing better she is overall pleased with the progress. 01/21/18; really quite a remarkable improvement since I've last seen these wounds. We started using silver nitrate specially on the islands of hyper granulation which for some reason her around the wound circumference. This is really done quite nicely. Primary dressing Hydrofera Blue under 4 layer compression. She seems to be able to hold out without a nurse rewrap. Follow-up in 1 week 01/28/18; we've continued the hydrofera blue but continued with chemical cauterization to the wound area that we started about a month ago for irregular hyper granulation. She is made almost stunning improvement in the overall wound dimensions. I was not really expecting this degree of improvement in these chronic wounds 02/05/18; we continue with Hydrofera Bluebut of also continued the aggressive chemical cauterization with silver nitrate. We made nice progress with the right greater than left wound. 02/12/18. We continued with Hydrofera Blue after aggressive chemical cauterization with silver nitrate. We appear to be making nice progress with both wound areas 02/19/2018; we continue with [facility] after washing the wounds vigorously with Anasept spray and chemical cauterization with silver nitrate. We are making excellent progress. The area on the right's just about closed 02/26/2018. The area on the left medial  ankle had too much necrotic debris today. I used a #5 curette we are able to get most of the soft. I continued with the silver nitrate to the much smaller wound on the right medial ankle she had a new area on her right lower pretibial area which she says was due to a role in her compression 03/05/2018; both wound areas look healthy. Not much change in dimensions from last week. I continue to use silver nitrate and Hydrofera Blue. The patient saw Dr. Doren Custard of vein and vascular. He felt she had venous stasis ulcers. He felt  based on her previous arteriogram she should have adequate circulation for healing. Also she has deep venous reflux but really no significant correctable superficial venous reflux at this time. He felt we should continue with conservative management including leg elevation and compression 04/02/2018; since we last saw this woman about a month ago she had a fall apparently suffered a pelvic fracture. I did not look up the x-ray. Nevertheless because of pain she literally was bedbound for 2 weeks and had home health coming out to change the dressing. Somewhat predictably this is resulted in considerable improvement in both wound areas. The right is just about closed on the medial malleolus and the left is about half the size. 04/16/2018; both her wounds continue to go down in size. Using Hydrofera Blue. 05/07/18; both her wounds appeared to be improving especially on the right where it is almost closed. We are using Hydrofera Blue 05/14/2018; slightly worse this week with larger wounds. Surface on the left medial not quite as good. We have been using Hydrofera Blue 05/21/18; again the wounds are slightly larger. Left medial malleolus slightly larger with eschar around the circumference. We have been using Hydrofera Blue undergoing a wraps for a prolonged period of time. This got a lot better when she was more recumbent due to a fall and a back injury. I change the primary dressing the silver alginate today. She did not tolerate a 4 layer compression previously although I may need to bring this up with her next time 05/28/2018; area on the left medial malleolus again is slightly larger with more drainage. Area on the right is roughly unchanged. She has a small area of folliculitis on the right medial just on the lower calf. This does not look ominous. 06/03/2018 left medial malleolus slightly smaller in a better looking surface. We used silver nitrate on this last time with silver alginate. The area  on the right appears slightly smaller 1/10; left medial malleolus slightly smaller. Small open area on the right. We used silver nitrate and silver alginate as of 2 weeks ago. We continue with the wound and compression. These got a lot better when she was off her feet 1/17; right medial malleolus wound is smaller. The left may be slightly smaller. Both surfaces look somewhat better. 1/24; both wounds are slightly smaller. Using silver alginate under Unna boots 1/31; both wounds appear smaller in fact the area on the right medial is just about closed. Surface eschar. We have been using silver alginate under Unna boots. The patient is less active now spends let much less time on her feet and I think this is contributed to the general improvement in the wound condition 2/7; both wounds appear smaller. I was hopeful the right medial would be closed however there there is still the same small open area. Slight amount of surface eschar on the left the dimensions are smaller there is eschar but  the wound edges appear to be free. We have been using silver alginate under Unna boot's 2/14; both wounds once again measure smaller. Circumferential eschar on the left medial. We have been using silver alginate under Unna boots with gradual improvement 2/21; the area on the right medial malleolus has healed. The area on the left is smaller. We have been using silver alginate and Unna boots. We can discharge wrapping the right leg she has 20/30 stockings at home she will need to protect the scar tissue in this area 2/28; the area on the right medial malleolus remains closed the patient has a compression stocking. The area on the left is smaller. We have been using silver alginate and Unna boots. 3/6 the area on the right medial ankle remains closed. Good edema control noted she is using her own compression stocking. The area on the left medial ankle is smaller. We have been managing this with silver alginate and  Unna boots which we will continue today. 3/13; the area on the right medial ankle remains closed and I'm declaring it healed today. When necessary the left is about the same still a healthy-looking surface but no major change and wound area. No evidence of infection and using silver alginate under unna and generally making considerable improvement 3/27 the area on the right medial ankle remains closed the area on the left is about the same as last week. Certainly not any worse we have been using silver alginate under an Unna boot 4/3; the area on the right medial ankle remains closed per the patient. We did not look at this wound. The wound on the left medial ankle is about the same surface looks healthy we have been using silver alginate under an Unna boot 4/10; area on the right medial ankle remains closed per the patient. We did not look at this wound. The wound on the left medial ankle is slightly larger. The patient complains that the [facility] caused burning pain all week. She also told us that she was a lot more active this week. Changed her back to silver alginate 4/17; right medial ankle still closed per the patient. Left medial ankle is slightly larger. Using silver alginate. She did not tolerate Hydrofera Blue on this area 4/24; right medial ankle remains closed we have not look at this. The left medial ankle continues to get larger today by about a centimeter. We have been using silver alginate under Unna boots. She complains about 4 layer compression as an alternative. She has been up on her feet working on her garden 5/8; right medial ankle remains closed we did not look at this. The left medial ankle has increased in size about 100%. We have been using silver alginate under Unna boots. She noted increased pain this week and was not surprised that the wound is deteriorated 5/15; no major change in SA however much less erythema ( one week of doxy ocellulitis). 5/22-64 year old  female returns at 1 week to the clinic for left medial ankle wound for which we have been using silver alginate under 3 layer compression She was placed on DOXY at last visit - the wound is wider at this visit. She is in 3 layer compression 5/29; change to [facility] last week. I had given her empiric doxycycline 2 weeks ago for a week. She is in 3 layer compression. She complains of a lot of pain and drainage on presentation today. 6/5; using Hydrofera Blue. I gave her doxycycline recently empirically for  erythema and pain around the wound. Believe her cultures showed enterococcus which not would not have been well covered by doxycycline nevertheless the wound looks better and I don't feel specifically that the enterococcus needs to be covered. She has a new what looks like a wrap injury on her lateral left ankle. 6/12; she is using Hydrofera Blue. She has a new area on the left anterior lower tibial area. This was a wrap injury last week. 6/19; the patient is using Hydrofera Blue. She arrived with marked inflammation and erythema around the wound and tenderness. 12/01/18 on evaluation today patient appears to be doing a little bit better based on what I'm hearing from the standpoint of lassos evaluation to this as far as the overall appearance of the wound is concerned. Then sometime substandard she typically sees Dr. Dellia Nims. Nonetheless overall very pleased with the progress that she's made up to this point. No fevers, chills, nausea, or vomiting noted at this time. 7/10; some improvement in the surface area. Aggressively debrided last week apparently. I went ahead with the debridement today although the patient does not tolerate this very well. We have been using Iodoflex. Still a fair amount of drainage 7/17; slightly smaller. Using Iodoflex. 7/24; no change from last week in terms of surface area. We have been using Iodoflex. Surface looks and continues to look somewhat better 7/31;  surface area slightly smaller better looking surface. We have been using Iodoflex. This is under Unna boot compression 8/7-Patient presents at 1 week with Unna boot and Iodoflex, wound appears better 8/14-Patient presents at 1 week with Iodoflex, we use the Unna boot, wound appears to be stable better.Patient is getting Botox treatment for the inversion of the foot for tendon release, Next week 8/21; we are using Iodoflex. Unna boot. The wound is stable in terms of surface area. Under illumination there is some areas of the wound that appear to be either epithelialized or perhaps this is adherent slough at this point I was not really clear. It did not wipe off and I was reluctant to debride this today. 8/28; we are using Iodoflex in an Unna boot. Seems to be making good improvement. 9/4; using Iodoflex and wound is slightly smaller. 9/18; we are using Iodoflex with topical silver nitrate when she is here. The wound continues to be smaller 10/2; patient missed her appointment last week due to GI issues. She left and Iodoflex based dressing on for 2 weeks. Wound is about the same size about the size of a dime on the left medial lower 10/9 we have been using Iodoflex on the medial left ankle wound. She has a new superficial probable wrap injury on the dorsal left ankle 10/16; we have been using Hydrofera Blue since last week. This is on the left medial ankle 10/23; we have been using Hydrofera Blue since 2 weeks ago. This is on the left medial ankle. Dimensions are better 11/6; using Hydrofera Blue. I think the wound is smaller but still not closed. Left medial ankle 11/13; we have been using Hydrofera Blue. Wound is certainly no smaller this week. Also the surface not as good. This is the remanent of a very large area on her left medial ankle. 11/20; using Sorbact since last week. Wound was about the same in terms of size although I was disappointed about the surface debris 12/11; 3-week  follow-up. Patient was on vacation. Wound is measuring slightly larger we have been using Sorbact. 12/18; wound is about the same size  however surface looks better last week after debridement. We have been using Sorbact under compression 1/15 wound is probably twice the size of last time increased in length nonviable surface. We have been using Sorbact. She was running a mild fever and missed her appointment last week 1/22; the wound is come down in size but under illumination still a very adherent debris we have been Hydrofera Blue that I changed her to last week 1/29; dimensions down slightly. We have been using Hydrofera Blue 2/19 dimensions are the same however there is rims of epithelialization under illumination. Therefore more the surface area may be epithelialized 2/26; the patient's wound actually measures smaller. The wound looks healthy. We have been using Hydrofera Blue. I had some thoughts about running Apligraf then I still may do that however this looks so much better this week we will delay that for now 3/5; the wound is small but about the same as last week. We have been using Hydrofera Blue. No debridement is required today. 3/19; the wound is about the size of a dime. Healthy looking wound even under illumination. We have been using Hydrofera Blue. No mechanical debridement is necessary 3/26; not much change from last week although still looks very healthy. We have been using Hydrofera Blue under Unna boots Patient was offered an ankle fusion by podiatry but not until the wound heals with a proceed with this. 4/9; the patient comes in today with her original wound on the medial ankle looking satisfactory however she has some uncontrolled swelling in the middle part of her leg with 2 new open areas superiorly just lateral to the tibia. I think this was probably a wrap issue. She said she felt uncomfortable during the week but did not call in. We have been using Hydrofera  Blue Electronic Signature(s) Signed: 09/12/2019 9:07:11 AM By: Linton Ham MD Entered By: Linton Ham on 09/09/2019 11:15:11 -------------------------------------------------------------------------------- Physical Exam Details Patient Name: Date of Service: [name] 09/09/2019 9:45 AM Medical Record [id number] Patient Account Number: [id number] Date of Birth/Sex: Treating RN: [birth year] (64 y.o. [name] Primary Care Provider: [name] Other Clinician: Referring Provider: Treating Provider/Extender:Tharon Kitch, Tawny Asal in Treatment: 352 Constitutional Sitting or standing Blood Pressure is within target range for patient.. Pulse regular and within target range for patient.Marland Kitchen Respirations regular, non-labored and within target range.. Temperature is normal and within the target range for the patient.Marland Kitchen Appears in no distress. Cardiovascular Peripheral pulses are palpable. Notes Wound exam; left medial malleolus under illumination actually looks quite healthy. This may actually be somewhat smaller than last time no mechanical debridement was necessary Unfortunately there is a localized area of swelling from just above where her original wound is on the ankle to 10 to 15 cm above this area. She has 2 small superficial wounds as a result of this. I think this was a wrap issue. I do not see any evidence of infection there is no localized tenderness Electronic Signature(s) Signed: 09/12/2019 9:07:11 AM By: Linton Ham MD Entered By: Linton Ham on 09/09/2019 11:16:33 -------------------------------------------------------------------------------- Physician Orders Details Patient Name: Date of Service: [name] 09/09/2019 9:45 AM Medical Record [id number] Patient Account Number: [id number] Date of Birth/Sex: Treating RN: 08-09-1955 (64 y.o. 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 Dressing Change Frequency Wound #3 Left,Medial Malleolus Do not change entire dressing for one week. - change dressing in one week Skin Barriers/Peri-Wound Care Moisturizing lotion TCA Cream or Ointment - mixed with lotion Wound Cleansing Clean wound with Wound Cleanser - clean with anasept with dressing changes May shower with protection. Primary Wound Dressing Wound #3 Left,Medial Malleolus Hydrofera Blue - classic Secondary Dressing Wound #3 Left,Medial Malleolus Dry Gauze Edema Control 2 Layer Compression System - Left Lower Extremity - Coflex Avoid standing for long periods of time Elevate legs to the level of the heart or above for 30 minutes daily and/or when sitting, a frequency of: - throughout the day Support Garment 20-30 mm/Hg pressure to: - compression stocking right leg daily Electronic Signature(s) Signed: 09/09/2019 6:04:52 PM By: Kela Millin Signed: 09/12/2019 9:07:11 AM By: Linton Ham MD Entered By: Kela Millin on 09/09/2019 10:43:08 -------------------------------------------------------------------------------- Problem List Details Patient Name: Date of Service: [name] 09/09/2019 9:45 AM Medical Record [id number] Patient Account Number: [id number] Date of Birth/Sex: Treating RN: 1956/03/18 (64 y.o. 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 08/03/2014 08/03/2014 and inflammation L03.116 Cellulitis of left lower limb 09/07/2014 09/07/2014 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 11/16/2014 11/16/2014 and inflammation Resolved Problems ICD-10 Code Description Active Date Resolved Date L97.312 Non-pressure chronic ulcer of right ankle with fat layer 04/10/2016 04/10/2016 exposed Electronic Signature(s) Signed: 09/12/2019 9:07:11 AM By: Linton Ham MD Entered By: Linton Ham on 09/09/2019 11:13:43 -------------------------------------------------------------------------------- Progress Note Details Patient Name: Date of Service: [name] 09/09/2019 9:45 AM Medical Record [id number] Patient Account Number: [id number] Date of Birth/Sex: Treating RN: 10-03-1955 (64 y.o. [name] Primary Care Provider: [name] Other Clinician: Referring Provider: Treating Provider/Extender:Beuna Bolding, Tawny Asal in Treatment: 352 Subjective History of Present Illness (HPI) the remaining wound is over the left medial ankle. Similar wound over the right medial ankle healed largely with use of Apligraf. Most recently we have been using Hydrofera Blue over this wound with considerable improvement. The patient has been extensively worked up in the past for her venous insufficiency and she is not a candidate for antireflux surgery although I have none of the details available currently. 08/24/14; considerable improvement today. About 50% of this wound areas now epithelialized. The base of the wound appears to be healthier  granulation.as opposed to last week when she had deteriorated a considerable improvement 08/17/14; unfortunately the wound has regressed somewhat. The areas of epithelialization from the superior aspect are not nearly as healthy as they were last week. The patient thinks her Hydrofera Blue slipped. 09/07/14; unfortunately the area has markedly regressed in the 2 weeks since I've seen this. There is an odor surrounding erythema. The healthy granulation tissue that we had at the base of the wound now is a dusky color. The nurse reports green drainage 09/14/14; the area looks somewhat better than last week. There is less erythema and less drainage. The culture  I did did not show any growth. Nevertheless I think it is better to continue the Cipro and doxycycline for a further week. The remaining wound area was debridement. 09/21/14. Wound did not require debridement last week. Still less erythema and less drainage. She can complete her antibiotics. The areas of epithelialization in the superior aspect of the wound do not look as healthy as they did some weeks ago 10/05/14 continued improvement in the condition of this wound. There is advancing epithelialization. Less aggressive debridement required 10/19/14 continued improvement in the condition and volume of this wound. Less aggressive debridement to the inferior part of this to remove surface slough and fibrinous eschar 11/02/14 no debridement is required. The surface granulation appears healthy although some of her islands of epithelialization seem to have regressed. No evidence of infection 11/16/14; lites surface debridement done of surface eschar. The wound does not look to be unhealthy. No evidence of infection. Unfortunately the patient has had podiatry issues in the right foot and for some reason has redeveloped small surface ulcerations in the medial right ankle. Her original presentation involved wounds in this area 11/23/14 no debridement. The  area on the right ankle has enlarged. The left ankle wound appears stable in terms of the surface although there is periwound inflammation. There has been regression in the amount of new skin 11/30/14 no debridement. Both wound areas appear healthy. There was no evidence of infection. The the new area on the right medial ankle has enlarged although that both the surfaces appear to be stable. 12/07/14; Debridement of the right medial ankle wound. No no debridement was done on the left. 12/14/14 no major change in and now bilateral medial ankle wounds. Both of these are very painful but the no overt evidence of infection. She has had previous venous ablation 12/21/14; patient states that her right medial ankle wound is considerably more painful last week than usual. Her left is also somewhat painful. She could not tolerate debridement. The right medial ankle wound has fibrinous surface eschar 12/28/14 this is a patient with severe bilateral venous insufficiency ulcers. For a considerable period of time we actually had the one on the right medial ankle healed however this recently opened up again in June. The left medial ankle wound has been a refractory area with some absent flows. We had some success with Hydrofera Blue on this area and it literally closed by 50% however it is recently opened up Foley. Both of these were debridement today of surface eschar. She tolerates this poorly 01/25/15: No change in the status of this. Thick adherent escar. Very poor tolerance of any attempt at debridement. I had healed the right medial malleolus wound for a considerable amount of time and had the left one down to about 50% of the volume although this is totally regressed over the last 48 weeks. Further the right leg has reopened. she is trying to make a appointment with pain and vascular, previous ablations with Dr. Aleda Grana. I do not believe there is an arterial insufficiency issue here 02/01/15 the status of  the adherent eschar bilaterally is actually improved. No debridement was done. She did not manage to get vascular studies done 02/08/15 continued debridement of the area was done today. The slough is less adherent and comes off with less pressure. There is no surrounding infection peripheral pulses are intact 02/15/15 selective debridement with a disposable curette. Again the slough is less adherent and comes off with less difficulty. No surrounding infection peripheral pulses are  intact. 02/22/15 selective debridement of the right medial ankle wound. Slough comes off with less difficulty. No obvious surrounding infection peripheral pulses are intact I did not debridement the one on the left. Both of these are stable to improved 03/01/15 selective debridement of both wound areas using a curette to. Adherent slough cup soft with less difficulty. No obvious surrounding infection. The patient tells me that 2 days ago she noted a rash above the right leg wrap. She did not have this on her lower legs when she change this over she arrives with widespread left greater than right almost folliculitis-looking rash which is extremely pruritic. I don't see anything to culture here. There is no rash on the rest of her body. She feels well systemically. 03/08/15; selective debridement of both wounds using a curette. Base of this does not look unhealthy. She had limegreen drainage coming out of the left leg wound and describes a lot of drainage. The rash on her left leg looks improved to. No cultures were done. 03/22/15; patient was not here last week. Basal wounds does not look healthy and there is no surrounding erythema. No drainage. There is still a rash on the left leg that almost looks vasculitic however it is clearly limited to the top of where the wrap would be. 04/05/15; on the right required a surgical debridement of surface eschar and necrotic subcutaneous tissue. I did not debridement the area on the  left. These continue to be large open wounds that are not changing that much. We were successful at one point in healing the area on the right, and at the same time the area on the left was roughly half the size of current measurements. I think a lot of the deterioration has to do with the prolonged time the patient is on her feet at work 04/19/15 I attempted-like surface debridement bilaterally she does not tolerate this. She tells me that she was in allergic care yesterday with extreme pain over her left lateral malleolus/ankle and was told that she has an "sprain" 05/03/15; large bilateral venous insufficiency wounds over the medial malleolus/medial aspect of her ankles. She complains of copious amounts of drainage and his usual large amounts of pain. There is some increasing erythema around the wound on the right extending into the medial aspect of her foot to. historically she came in with these wounds the right one healed and the left one came down to roughly half its current size however the right one is reopened and the left is expanded. 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 05/10/15 large bilateral venous insufficiency wounds. There is less adherence surface left however the surface culture that I did last week grew pseudomonas therefore bilateral selective debridement score necessary. There is surrounding erythema. The patient describes severe bilateral drainage and a lot of pain in the left ankle. Apparently her podiatrist was were ready to do a cortisone shot 05/17/15; the patient complains of pain and again copious amounts of drainage. 05/24/15; we used Iodo flex last week. Patient notes considerable improvement in wound drainage. Only needed to change this once. 05/31/15; we continued Iodoflex; the base of these large wounds bilaterally is not too bad but there is probably likely a significant bioburden here. I would like to  debridement just doesn't tolerate it. 06/06/14 I would like to continue the Iodoflex although she still hasn't managed to obtain supplies. She has bilateral medial malleoli  or large wounds which are mostly superficial. Both of them are covered circumferentially with some nonviable fibrinous slough although she tolerates debridement very poorly. She apparently has an appointment for an ablation on the right leg by interventional radiology. 06/14/15; the patient arrives with the wounds and static condition. We attempted a debridement although she does not do well with this secondary to pain. I 07/05/15; wounds are not much smaller however there appears to be a cleaner granulating base. The left has tight fibrinous slough greater than the right. Debridement is tolerated poorly due to pain. Iodoflex is done more for these wounds in any of the multitude of different dressings I have tried on the left 1 and then subsequently the right. 07/12/15; no change in the condition of this wound. I am able to do an aggressive debridement on the right but not the left. She simply cannot tolerate it. We have been using Iodoflex which helps somewhat. It is worthwhile remembering that at one point we healed the right medial ankle wound and the left was about 25% of the current circumference. We have suggested returning to vascular surgery for review of possible further ablations for one reason or another she has not been able to do this. 07/26/15 no major change in the condition of either wound on her medial ankle. I did not attempt to debridement of these. She has been aggressively scrubbing these while she is in the shower at home. She has her supply of Iodoflex which seems to have done more for these wounds then anything I have put on recently. 08/09/15 wound area appears larger although not verified by measurements. Using Iodoflex 09/05/2015 -- she was here for avisit today but had significant problems with the wound and I  was asked to see her for a physician opinion. I have summarize that this lady has had surgery on her left lower extremity about 10 years ago where the possible veins stripping was done. She has had an opinion from interventional radiology around November 2016 where no further sclerotherapy was ordered. The patient works 12 hours a day and stands on a concrete floor with work boots and is unable to get the proper compression she requires and cannot elevate her limbs appropriately at any given time. She has recently grown Pseudomonas from her wound culture but has not started her ciprofloxacin which was called in for her. 09/13/15 this continues to be a difficult situation for this patient. At one point I had this wound down to a 1.5 x 1.5" wound on her left leg. This is deteriorated and the right leg has reopened. She now has substantial wounds on her medial calcaneus, malleoli and into her lower leg. One on the left has surface eschar but these are far too painful for me to debridement here. She has a vascular surgery appointment next week to see if anything can be done to help here. I think she has had previous ablations several years ago at [facility]. She has no major edema. She tells me that she did not get product last time [facility]] and went for several days without it. She continues to work in work boots 12 hours a day. She cannot get compression/4-layer under her work boots. 09/20/15 no major change. Periwound edema control was not very good. Her point with pain and vascular is next Wednesday the 25th 09/28/15; the patient is seen vascular surgery and is apparently scheduled for repeat duplex ultrasounds of her bilateral lower legs next week. 10/05/15; the  patient was seen by Dr. Doren Custard of vascular surgery. He feels that she should have arterial insufficiency excluded as cause/contributed to her nonhealing stage she is therefore booked for an arteriogram. She has apparently monophasic  signals in the dorsalis pedis pulses. She also of course has known severe chronic venous insufficiency with previous procedures as noted previously. I had another long discussion with the patient today about her continuing to work 12 hour shifts. I've written her out for 2 months area had concerns about this as her work location is currently undergoing significant turmoil and this may lead to her termination. She is aware of this however I agree with her that she simply cannot continue to stand for 12 hours multiple days a week with the substantial wound areas she has. 10/19/15; the Dr. Doren Custard appointment was largely for an arteriogram which was normal. She does not have an arterial issue. He didn't make a comment about her chronic venous insufficiency for which she has had previous ablations. Presumably it was not felt that anything additional could be done. The patient is now out of work as I prescribed 2 weeks ago. Her wounds look somewhat less aggravated presumably because of this. I felt I would give debridement another try today 10/25/15; no major change in this patient's wounds. We are struggling to get her product that she can afford into her own home through her insurance. 11/01/15; no major change in the patient's wounds. I have been using silver alginate as the most affordable product. I spoke to Dr. Marla Roe last week with her requested take her to the OR for surgical debridement and placement of ACEL. Dr. Marla Roe told me that she would be willing to do this however [facility] will not cover this, fortunately the patient has [place] healthcare of some variant 11/08/15; no major change in the patient's wounds. She has been completely nonviable surface that this but is in too much pain with any attempted debridement are clinic. I have arranged for her to see Dr. Marla Roe ham of plastic surgery and this appointment is on Monday. I am hopeful that they will take her to the OR  for debridement, possible ACEL ultimately possible skin graft 11/22/15 no major change in the patient's wounds over her bilateral medial calcaneus medial malleolus into the lower legs. Surface on these does not look too bad however on the left there is surrounding erythema and tenderness. This may be cellulitis or could him sleepy tinea. 11/29/15; no major changes in the patient's wounds over her bilateral medial malleolus. There is no infection here and I don't think any additional antibiotics are necessary. There is now plan to move forward. She sees Dr. Marla Roe in a week's time for preparation for operative debridement and ACEL placement I believe on 7/12. She then has a follow-up appointment with Dr. Marla Roe on 7/21 12/28/15; the patient returns today having been taken to the [place] by Dr. Marla Roe 12/12/15 she underwent debridement, intraoperative cultures [which were negative]. She had placement of a wound VAC. Parent really ACEL was not available to be placed. The wound VAC foam apparently adhered to the wound since then she's been using silver alginate, Xeroform under Ace wraps. She still says there is a lot of drainage and a lot of pain 01/31/16; this is a patient I see monthly. I had referred her to Dr. Marla Roe him of plastic surgery for large wounds on her bilateral medial ankles. She has been to the OR twice once in early July  and once in early August. She tells me over the last 3 weeks she has been using the wound VAC with ACEL underneath it. On the right we've simply been using silver alginate. Under Kerlix Coban wraps. 02/28/16; this is a patient I'm currently seeing monthly. She is gone on to have a skin graft over her large venous insufficiency ulcer on the left medial ankle. This was done by Dr. Marla Roe him. The patient is a bit perturbed about why she didn't have one on her right medial ankle wound. She has been using silver alginate to this. 03/06/16; I received a phone  call from her plastic surgery Dr. Marla Roe. She expressed some concern about the viability of the skin graft she did on the left medial ankle wound. Asked me to place Endoform on this. She told me she is not planning to do a subsequent skin graft on the right as the left one did not take very well. I had placed Hydrofera Blue on the right 03/13/16; continue to have a reasonably healthy wound on the right medial ankle. Down to 3 mm in terms of size. There is epithelialization here. The area on the left medial ankle is her skin graft site. I suppose the last week this looks somewhat better. She has an open area inferiorly however in the center there appears to be some viable tissue. There is a lot of surface callus and eschar that will eventually need to come off however none of this looked to be infected. Patient states that the is able to keep the dressing on for several days which is an improvement. 03/20/16 no major change in the circumference of either wound however on the left side the patient was at Dr. Eusebio Friendly office and they did a debridement of left wound. 50% of the wound seems to be epithelialized. I been using Endoform on the left Hydrofera Blue in the right 03/27/16; she arrives today with her wound is not looking as healthy as they did last week. The area on the right clearly has an adherent surface to this a very similar surface on the left. Unfortunately for this patient this is all too familiar problem. Clearly the Endoform is not working and will need to change that today that has some potential to help this surface. She does not tolerate debridement in this clinic very well. She is changing the dressing wants 04/03/16; patient arrives with the wounds looking somewhat better especially on the right. Dr. Migdalia Dk change the dressing to silver alginate when she saw her on Monday and also sold her some compression socks. The usefulness of the latter is really not clear and woman  with severely draining wounds. 04/10/16; the patient is doing a bit of an experiment wearing the compression stockings that Dr. Migdalia Dk provided her to her left leg and the out of legs based dressings that we provided to the right. 05/01/16; the patient is continuing to wear compression stockings Dr. Migdalia Dk provided her on the left that are apparently silver impregnated. She has been using Iodoflex to the right leg wound. Still a moderate amount of drainage, when she leaves here the wraps only last for 4 days. She has to change the stocking on the left leg every night 05/15/16; she is now using compression stockings bilaterally provided by Dr. Marla Roe. She is wearing a nonadherent layer over the wounds so really I don't think there is anything specific being done to this now. She has some reduction on the left wound. The right  is stable. I think all healing here is being done without a specific dressing 06/09/16; patient arrives here today with not much change in the wound certainly in diameter to large circular wounds over the medial aspect of her ankle bilaterally. Under the light of these services are certainly not viable for healing. There is no evidence of surrounding infection. She is wearing compression stockings with some sort of silver impregnation as prescribed by Dr. Marla Roe. She has a follow-up with her tomorrow. 06/30/16; no major change in the size or condition of her wounds. These are still probably covered with a nonviable surface. She is using only her purchase stockings. She did see Dr. Marla Roe who seemed to want to apply Dakin's solution to this I'm not extreme short what value this would be. I would suggest Iodoflex which she still has at home. 07/28/16; I follow Mrs. Toren episodically along with Dr. Marla Roe. 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 She has been applying a topical collagen ointment  to both wounds with Adaptic. I don't think Dr. Marla Roe is planning to take her back to the OR. 08/19/16; I follow Mrs. Jeneen Rinks on a monthly basis along with Dr. Marla Roe of plastic surgery. 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 I been following her for a number of years. At one point I was able to get the right medial malleolus wound to heal and had the left medial malleolus down to about half its current size however and I had to send her to plastic surgery for an operative debridement. Since then things have been stable to slightly improve the area on the right is slightly better one in the left about the same although there is much less adherent surface than I'm used to with this patient. She is using some form of liquid collagen gel that Dr. Marla Roe provided a Kerlix cover with the patient's own pressure stockings. She tells me that she has extreme pain in both ankles and along the lateral aspect of both feet. She has been unable to work for some period of time. She is telling me she is retiring at the beginning of April. She sees Dr. Doran Durand of orthopedics next week 09/22/16; patient has not seen Dr. Marla Roe since the last time she is here. I'm not really sure what she is using to the wounds other than bits and pieces of think she had left over including most recently Hydrofera Blue. She is using juxtalite stockings. She is having difficulty with her husband's recent illness "stroke". She is having to transport him to various doctors appointments. Dr. Marla Roe left her the option of a repeat debridement with ACEL however she has not been able to get the time to follow-up on this. She continues to have a fair amount of drainage out of these wounds with certainly precludes leaving dressings on all week 10/13/16; patient has not seen Dr. Marla Roe since she was last in our clinic. I'm not really sure what she is  doing with the wounds, we did try to get her [facility] and I think she is actually using this most of the time. Because of drainage she states she has to change this every second day although this is an improvement from what she used to do. She went to see Dr. Doran Durand who did not think she had a muscular issue with regards to her feet, he referred her to  a neurologist and I think the appointment is sometime in June. I changed her back to Iodoflex which she has used in the past but not recently. 11/03/16; the patient has been using Iodoflex although she ran out of this. Still claims that there is a lot of drainage although the wound does not look like this. No surrounding erythema. She has not been back to see Dr. Marla Roe 11/24/16; the patient has been using Iodoflex again but she ran out of it 2 or 3 days ago. There is no major change in the condition of either one of these wounds in fact they are larger and covered in a thick adherent surface slough/nonviable tissue especially on the left. She does not tolerate mechanical debridement in our clinic. Going back to see Dr. Marla Roe of plastic surgery for an operative debridement would seem reasonable. 12/15/16; the patient has not been back to see Dr. Marla Roe. She is been dealing with a series of illnesses and her husband which of monopolized her time. She is been using Sorbact which we largely supplied. She states the drainage is bad enough that it maximum she can go 2-3 days without changing the dressing 01/12/2017 -- the patient has not been back for about 4 weeks and has not seen Dr. Marla Roe not does she have any appointment pending. 01/23/17; patient has not seen Dr. Marla Roe even though I suggested this previously. She is using Santyl that was suggested last week by Dr. Con Memos this Cost her $16 through her insurance which is indeed surprising 02/12/17; continuing Santyl and the patient is changing this daily. A lot of drainage. She  has not been back to see plastic surgery she is using an Ace wrap. Our intake nurse suggested wrap around stockings which would make a good reasonable alternative 02/26/17; patient is been using Santyl and changing this daily due to drainage. She has not been to see plastic surgery she uses in April Ace wrap to control the edema. She did obtain extremitease stockings but stated that the edema in her leg was to big for these 03/20/17; patient is using Santyl and Anasept. Surfaces looked better today the area on the right is actually measuring a little smaller. She has states she has a lot of pain in her feet and ankles and is asking for a consult to pain control which I'll try to help her with through our case manager. 04/10/17; the patient arrives with better-looking wound surfaces and is slightly smaller wound on the left she is using a combination of Santyl and Anasept. She has an appointment or at least as started in the pain control center associated with [facility] 05/14/17; this is a patient who I followed for a prolonged period of time. 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 At one point I had this down to a much smaller wound on the left however these reopened and we've never been able to get these to heal. She has been using Santyl and Anasept gel although 2 weeks ago she ran out of the Anasept gel. She has a stable appearance of the wound. She is going to the wound care clinic at [facility]. They wanted do a nerve block/spinal block although she tells me she is reluctant to go forward with that. 05/21/17; this is a patient I have followed for many years. 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 Chronic pain and deformity in her ankles as well. She is been to see plastic surgery  as well as orthopedics. Using PolyMem AG most recently/Kerramax/ABDs and 2 layer compression. She has managed to keep this on and she is coming  in for a nurse check to change the dressing on Tuesdays, we see her on Fridays 06/05/17; really quite a good looking surface and the area especially on the right medial has contracted in terms of dimensions. Well granulated healthy-looking tissue on both sides. Even with an open curet there is nothing that even feels abnormal here. This is as good as I've seen this in quite some time. We have been using PolyMem AG and bringing her in for a nurse check 06/12/17; really quite good surface on both of these wounds. The right medial has contracted a bit left is not. We've been using PolyMem and AG and she is coming in for a nurse visit 06/19/17; we have been using PolyMem AG and bringing her in for a nurse check. Dimensions of her wounds are not better but the surfaces looked better bilaterally. She complained of bleeding last night and the left wound and increasing pain bilaterally. She states her wound pain is more neuropathic than just the wounds. There was some suggestion that this was radicular from her pain management doctor in talking to her it is really difficult to sort this out. 06/26/17; using PolyMem and AG and bringing her in for a nurse check as All of this and reasonably stable condition. Certainly not improved. The dimensions on the lateral part of the right leg look better but not really measuring better. The medial aspect on the left is about the same. 07/03/16; we have been using PolyMen AG and bringing her in for a nurse check to change the dressings as the wounds have drainage which precludes once weekly changing. We are using all secondary absorptive dressings.our intake nurse is brought up the idea of using a wound VAC/snap VAC on the wound to help with the drainage to see if this would result in some contraction. This is not a bad idea. The area on the right medial is actually looking smaller. Both wounds have a reasonable-looking surface. There is no evidence of cellulitis. The edema  is well controlled 07/10/17; the patient was denied for a snap VAC by her insurance. The major issue with these wounds continues to be drainage. We are using wicked PolyMem AG and she is coming in for a nurse visit to change this. The wounds are stable to slightly improved. The surface looks vibrant and the area on the right certainly has shrunk in size but very slowly 07/17/17; the patient still has large wounds on her bilateral medial malleoli. Surface of both of these wounds looks better. The dimensions seem to come and go but no consistent improvement. There is no epithelialization. We do not have options for advanced treatment products due to insurance issues. They did not approve of the wound VAC to help control the drainage. More recently we've been using PolyMem and AG wicked to allow drainage through. We have been bringing her in for a nurse visit to change this. We do not have a lot of options for wound care products and the home again due to insurance issues 07/24/17; the patient's wound actually looks somewhat better today. No drainage measurements are smaller still healthy-looking surface. We used silver collagen under PolyMen started last week. We have been bringing her in for a dressing change 07/31/17; patient's wound surface continued to look better and I think there is visible change in the dimensions of  the wound on the right. Rims of epithelialization. We have been using silver collagen under PolyMen and bringing her in for a dressing change. There appears to be less drainage although she is still in need of the dressing change 08/07/17. Patient's wound surface continues to look better on both sides and the area on the right is definitely smaller. We have been using silver collagen and PolyMen. She feels that the drainage has been it has been better. I asked her about her vascular status. She went to see Dr. Aleda Grana at [facility] and had some form of ablation. I don't have  much detail on this. I haven't my notes from 2016 that she was not a candidate for any further ablation but I don't have any more information on this. We had referred her to vein and vascular I don't think she ever went. He does not have a history of PAD although I don't have any information on this either. We don't even have ABIs in our record 08/14/17; we've been using silver collagen and PolyMen cover. And putting the patient and compression. She we are bringing her in as a nurse visit to change this because ofarge amount of drainage. We didn't the ABIs in clinic today since they had been done in many moons 1.2 bilaterally. She has been to see vein and vascular however this was at [facility] and she had ablation although I really don't have any information on this all seemed biking get a report. She is also been operatively debrided by plastic surgery and had a cell placed probably 8-12 months ago. This didn't have a major effect. We've been making some gains with current dressings 08/19/17-She is here in follow-up evaluation for bilateral medial malleoli ulcers. She continues to tolerate debridement very poorly. We will continue with recently changed topical treatment; if no significant improvement may consider switching to Iodosorb/Iodoflex. 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 She has been using silver collagen and PolyMem. I believe she has been used and tried on Iodoflex before. 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 These have been very refractory over time. We have been using silver collagen and PolyMen. She is coming in once a week for a doctor's and once a week for nurse visits. We are actually making some progress 09/18/17; the  patient's wounds are smaller especially on the right medial. She arrives today to upset to consider even washing these off with Anasept which I think is been part of the reason this is been closing. We've been using collagen covered in PolyMen otherwise. It is noted that she has a small area of folliculitis on the right medial calf that. As we are wrapping her legs I'll give her a short course of doxycycline to make sure this doesn't amount to anything. She is a long list of complaints today including imbalance, shortness of breath on exertion, inversion of her left ankle. With regards to the latter complaints she is been to see orthopedics and they offered her a tendon release surgery I believe but wanted her wounds to be closed first. I have recommended she go see her primary physician with regards to everything else. 09/25/17; patient's wounds are about the same size. We have made some progress bilaterally although not in recent weeks.  She will not allow me T wash these wounds with Anasept even if she is doing her cell. Wheeze we've been using collagen covered in PolyMen. Last week she had a small area of folliculitis this is now opened into a small wound. She completed 5 days of trimethoprim sulfamethoxazole 10/02/17; unfortunately the area on her left medial ankle is worse with a larger wound area towards the Achilles. The patient complains of a lot of pain. She will not allow debridement although visually I don't think there is anything to debridement in any case. We have been using silver collagen and PolyMen for several months now. Initially we are making some progress although I'm not really seeing that today. We will move back to [facility]. His admittedly this is a bit of a repeat however I'm hoping that his situation is different now. The patient tells me she had her leg on the left give out on her yesterday this is process some pain. 10/09/17; the patient is seen twice a week largely  because of drainage issues coming out of the chronic medial bimalleolar wounds that are chronic. Last week the dimensions of the one on the left looks a little larger I changed her to [facility]. She comes in today with a history of terrible pain in the bilateral wound areas. She will not allow debridement. She will not even allow a tissue culture. There is no surrounding erythema no no evidence of cellulitis. We have been putting her Kerlix Coban man. She will not allow more aggressive compression as there was a suggestion to put her in 3 layer wraps. 10/16/17; large wounds on her bilateral medial malleoli. These are chronic. Not much change from last week. The surface looks have healthy but absolutely no epithelialization. A lot of pain little less so of drainage. She will not allow debridement or even washing these off in the vigorous fashion with Anasept. 10/23/17; large wounds on her bilateral malleoli which are chronic. Some improvement in terms of size perhaps on the right since last time I saw these. She states that after we increased the 3 layer compression there was some bleeding, when she came in for a nurse visit she did not want 3 layer compression put back on about our nurse managed to convince her. She has known chronic venous visit issues and I'm hoping to get her to tolerate the 3 layer compression. using Hydrofera Blue 10/30/17; absolutely no change in the condition of either wound although we've had some improvement in dimensions on the right.. Attempted to put her in 3 layer compression she didn't tolerated she is back in 2 layer compression. We've been using Hydrofera Blue We looked over her past records. She had venous reflux studies in November 2016. There was no evidence of deep venous reflux on the right. Superficial vein did not show the greater saphenous vein at think this is been previously ablated the small saphenous vein was within normal limits. The left deep venous  system showed no DVT the vessels were positive for deep venous reflux in the posterior tibial veins at the ankle. The greater saphenous vein was surgically absent small saphenous vein was within normal limits. She went to vein and vascular at [facility]. I believe she had an ablation on the left greater saphenous vein. I'll update her reflux studies perhaps ever reviewed by vein and vascular. We've made absolutely no progress in these wounds. Will also try to read and TheraSkins through her insurance 11/06/17; W the patient  apparently has a 2 week follow-up with vein and vascular I like him to review the whole issue with regards to her previous vascular workup by Dr. Aleda Grana. We've really made no progress on these wounds in many months. She arrives today with less viable looking surface on the left medial ankle wound. This was apparently looking about the same on Tuesday when she was here for nurse visit. 11/13/17; deep tissue culture I did last time of the left lower leg showed multiple organisms without any predominating. In particular no Staphylococcus or group A strep were isolated. We sent her for venous reflux studies. She's had a previous left greater saphenous vein stripping and I think sclerotherapy of the right greater saphenous vein. She didn't really look at the lesser saphenous vein this both wounds are on the medial aspect. She has reflux in the common femoral vein and popliteal vein and an accessory vein on the right and the common femoral vein and popliteal vein on the left. I'm going to have her go to see vein and vascular just the look over things and see if anything else beside aggressive compression is indicated here. We have not been able to make any progress on these wounds in spite of the fact that the surface of the wounds is never look too bad. 11/20/17; no major change in the condition of the wounds. Patient reports a large amount of drainage. She has a lot of  complaints of pain although enlisting her today I wonder if some of this at least his neuropathic rather than secondary to her wounds. She has an appointment with vein and vascular on 12/30/17. The refractory nature of these wounds in my mind at least need vein and vascular to look over the wounds the recent reflux studies we did and her history to see if anything further can be done here. I also note her gait is deteriorated quite a bit. 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 She has a bilateral Trendelenburg gait. I wonder if this is neuropathic or perhaps multilevel radicular. 11/27/17; her wounds actually looks slightly better. Healthy-looking granulation tissue a scant amount of epithelialization. [place] [facility] will not pay for [employer]. They will play for tri layer Oasis and Dermagraft. This is not a diabetic ulcer. We'll try for the tri layer Oasis. She still complains of some drainage. She has a vein and vascular appointment on 12/30/17 12/04/17; the wounds visually look quite good. Healthy-looking granulation with some degree of epithelialization. We are still waiting for response to our request for trial to try layer Oasis. Her appointment with vascular to review venous and arterial issues isn't sold the end of July 7/31. Not allow debridement or even vigorous cleansing of the wound surface. 12/18/17; slightly smaller especially on the right. 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 We've been using Hydrofera Blue. We still are looking into triple layer Oasis through her insurance 01/08/18 on evaluation today patient's wound actually appears to be showing signs of good improvement at this point in time. She has been tolerating the dressing changes without complication. Fortunately there does not appear to be any evidence of infection at this point in time. We have been utilizing silver nitrate which does seem to be of benefit for her which is  also good news. Overall I'm very happy with how things seem to be both regards appearance as well as measurement. Patient did see Dr. Bridgett Larsson for evaluation on 12/30/17. In his assessment he felt  that stripping would not likely add much more than chronic compression to the patient's healing process. His recommendation was to follow-up in three months with Dr. Doren Custard if she hasn't healed in order to consider referral back to you and see vascular where she previously was in a trial and was able to get her wound to heal. I'll be see what she feels she when you staying compression and he reiterated this as well. 01/13/18 on evaluation today patient appears to actually be doing very well in regard to her bilateral medial malleolus ulcers. She seems to have tolerated the chemical cauterization with silver nitrate last week she did have some pain through that evening but fortunately states that I'll be see since it seems to be doing better she is overall pleased with the progress. 01/21/18; really quite a remarkable improvement since I've last seen these wounds. We started using silver nitrate specially on the islands of hyper granulation which for some reason her around the wound circumference. This is really done quite nicely. Primary dressing Hydrofera Blue under 4 layer compression. She seems to be able to hold out without a nurse rewrap. Follow-up in 1 week 01/28/18; we've continued the hydrofera blue but continued with chemical cauterization to the wound area that we started about a month ago for irregular hyper granulation. She is made almost stunning improvement in the overall wound dimensions. I was not really expecting this degree of improvement in these chronic wounds 02/05/18; we continue with Hydrofera Bluebut of also continued the aggressive chemical cauterization with silver nitrate. We made nice progress with the right greater than left wound. 02/12/18. We continued with Hydrofera Blue after  aggressive chemical cauterization with silver nitrate. We appear to be making nice progress with both wound areas 02/19/2018; we continue with [facility] after washing the wounds vigorously with Anasept spray and chemical cauterization with silver nitrate. We are making excellent progress. The area on the right's just about closed 02/26/2018. The area on the left medial ankle had too much necrotic debris today. I used a #5 curette we are able to get most of the soft. I continued with the silver nitrate to the much smaller wound on the right medial ankle she had a new area on her right lower pretibial area which she says was due to a role in her compression 03/05/2018; both wound areas look healthy. Not much change in dimensions from last week. I continue to use silver nitrate and Hydrofera Blue. The patient saw Dr. Doren Custard of vein and vascular. He felt she had venous stasis ulcers. He felt based on her previous arteriogram she should have adequate circulation for healing. Also she has deep venous reflux but really no significant correctable superficial venous reflux at this time. He felt we should continue with conservative management including leg elevation and compression 04/02/2018; since we last saw this woman about a month ago she had a fall apparently suffered a pelvic fracture. I did not look up the x-ray. Nevertheless because of pain she literally was bedbound for 2 weeks and had home health coming out to change the dressing. Somewhat predictably this is resulted in considerable improvement in both wound areas. The right is just about closed on the medial malleolus and the left is about half the size. 04/16/2018; both her wounds continue to go down in size. Using Hydrofera Blue. 05/07/18; both her wounds appeared to be improving especially on the right where it is almost closed. We are using Hydrofera Blue 05/14/2018; slightly worse  this week with larger wounds. Surface on the left medial  not quite as good. We have been using Hydrofera Blue 05/21/18; again the wounds are slightly larger. Left medial malleolus slightly larger with eschar around the circumference. We have been using Hydrofera Blue undergoing a wraps for a prolonged period of time. This got a lot better when she was more recumbent due to a fall and a back injury. I change the primary dressing the silver alginate today. She did not tolerate a 4 layer compression previously although I may need to bring this up with her next time 05/28/2018; area on the left medial malleolus again is slightly larger with more drainage. Area on the right is roughly unchanged. She has a small area of folliculitis on the right medial just on the lower calf. This does not look ominous. 06/03/2018 left medial malleolus slightly smaller in a better looking surface. We used silver nitrate on this last time with silver alginate. The area on the right appears slightly smaller 1/10; left medial malleolus slightly smaller. Small open area on the right. We used silver nitrate and silver alginate as of 2 weeks ago. We continue with the wound and compression. These got a lot better when she was off her feet 1/17; right medial malleolus wound is smaller. The left may be slightly smaller. Both surfaces look somewhat better. 1/24; both wounds are slightly smaller. Using silver alginate under Unna boots 1/31; both wounds appear smaller in fact the area on the right medial is just about closed. Surface eschar. We have been using silver alginate under Unna boots. The patient is less active now spends let much less time on her feet and I think this is contributed to the general improvement in the wound condition 2/7; both wounds appear smaller. I was hopeful the right medial would be closed however there there is still the same small open area. Slight amount of surface eschar on the left the dimensions are smaller there is eschar but the wound edges appear  to be free. We have been using silver alginate under Unna boot's 2/14; both wounds once again measure smaller. Circumferential eschar on the left medial. We have been using silver alginate under Unna boots with gradual improvement 2/21; the area on the right medial malleolus has healed. The area on the left is smaller. We have been using silver alginate and Unna boots. We can discharge wrapping the right leg she has 20/30 stockings at home she will need to protect the scar tissue in this area 2/28; the area on the right medial malleolus remains closed the patient has a compression stocking. The area on the left is smaller. We have been using silver alginate and Unna boots. 3/6 the area on the right medial ankle remains closed. Good edema control noted she is using her own compression stocking. The area on the left medial ankle is smaller. We have been managing this with silver alginate and Unna boots which we will continue today. 3/13; the area on the right medial ankle remains closed and I'm declaring it healed today. When necessary the left is about the same still a healthy-looking surface but no major change and wound area. No evidence of infection and using silver alginate under unna and generally making considerable improvement 3/27 the area on the right medial ankle remains closed the area on the left is about the same as last week. Certainly not any worse we have been using silver alginate under an Unna boot 4/3; the area  on the right medial ankle remains closed per the patient. We did not look at this wound. The wound on the left medial ankle is about the same surface looks healthy we have been using silver alginate under an Unna boot 4/10; area on the right medial ankle remains closed per the patient. We did not look at this wound. The wound on the left medial ankle is slightly larger. The patient complains that the [facility] caused burning pain all week. She also told us that she  was a lot more active this week. Changed her back to silver alginate 4/17; right medial ankle still closed per the patient. Left medial ankle is slightly larger. Using silver alginate. She did not tolerate Hydrofera Blue on this area 4/24; right medial ankle remains closed we have not look at this. The left medial ankle continues to get larger today by about a centimeter. We have been using silver alginate under Unna boots. She complains about 4 layer compression as an alternative. She has been up on her feet working on her garden 5/8; right medial ankle remains closed we did not look at this. The left medial ankle has increased in size about 100%. We have been using silver alginate under Unna boots. She noted increased pain this week and was not surprised that the wound is deteriorated 5/15; no major change in SA however much less erythema ( one week of doxy ocellulitis). 5/22-64 year old female returns at 1 week to the clinic for left medial ankle wound for which we have been using silver alginate under 3 layer compression She was placed on DOXY at last visit - the wound is wider at this visit. She is in 3 layer compression 5/29; change to [facility] last week. I had given her empiric doxycycline 2 weeks ago for a week. She is in 3 layer compression. She complains of a lot of pain and drainage on presentation today. 6/5; using Hydrofera Blue. I gave her doxycycline recently empirically for erythema and pain around the wound. Believe her cultures showed enterococcus which not would not have been well covered by doxycycline nevertheless the wound looks better and I don't feel specifically that the enterococcus needs to be covered. She has a new what looks like a wrap injury on her lateral left ankle. 6/12; she is using Hydrofera Blue. She has a new area on the left anterior lower tibial area. This was a wrap injury last week. 6/19; the patient is using Hydrofera Blue. She arrived with marked  inflammation and erythema around the wound and tenderness. 12/01/18 on evaluation today patient appears to be doing a little bit better based on what I'm hearing from the standpoint of lassos evaluation to this as far as the overall appearance of the wound is concerned. Then sometime substandard she typically sees Dr. Dellia Nims. Nonetheless overall very pleased with the progress that she's made up to this point. No fevers, chills, nausea, or vomiting noted at this time. 7/10; some improvement in the surface area. Aggressively debrided last week apparently. I went ahead with the debridement today although the patient does not tolerate this very well. We have been using Iodoflex. Still a fair amount of drainage 7/17; slightly smaller. Using Iodoflex. 7/24; no change from last week in terms of surface area. We have been using Iodoflex. Surface looks and continues to look somewhat better 7/31; surface area slightly smaller better looking surface. We have been using Iodoflex. This is under Unna boot compression 8/7-Patient presents at 1 week  with Unna boot and Iodoflex, wound appears better 8/14-Patient presents at 1 week with Iodoflex, we use the Unna boot, wound appears to be stable better.Patient is getting Botox treatment for the inversion of the foot for tendon release, Next week 8/21; we are using Iodoflex. Unna boot. The wound is stable in terms of surface area. Under illumination there is some areas of the wound that appear to be either epithelialized or perhaps this is adherent slough at this point I was not really clear. It did not wipe off and I was reluctant to debride this today. 8/28; we are using Iodoflex in an Unna boot. Seems to be making good improvement. 9/4; using Iodoflex and wound is slightly smaller. 9/18; we are using Iodoflex with topical silver nitrate when she is here. The wound continues to be smaller 10/2; patient missed her appointment last week due to GI issues. She left  and Iodoflex based dressing on for 2 weeks. Wound is about the same size about the size of a dime on the left medial lower 10/9 we have been using Iodoflex on the medial left ankle wound. She has a new superficial probable wrap injury on the dorsal left ankle 10/16; we have been using Hydrofera Blue since last week. This is on the left medial ankle 10/23; we have been using Hydrofera Blue since 2 weeks ago. This is on the left medial ankle. Dimensions are better 11/6; using Hydrofera Blue. I think the wound is smaller but still not closed. Left medial ankle 11/13; we have been using Hydrofera Blue. Wound is certainly no smaller this week. Also the surface not as good. This is the remanent of a very large area on her left medial ankle. 11/20; using Sorbact since last week. Wound was about the same in terms of size although I was disappointed about the surface debris 12/11; 3-week follow-up. Patient was on vacation. Wound is measuring slightly larger we have been using Sorbact. 12/18; wound is about the same size however surface looks better last week after debridement. We have been using Sorbact under compression 1/15 wound is probably twice the size of last time increased in length nonviable surface. We have been using Sorbact. She was running a mild fever and missed her appointment last week 1/22; the wound is come down in size but under illumination still a very adherent debris we have been Hydrofera Blue that I changed her to last week 1/29; dimensions down slightly. We have been using Hydrofera Blue 2/19 dimensions are the same however there is rims of epithelialization under illumination. Therefore more the surface area may be epithelialized 2/26; the patient's wound actually measures smaller. The wound looks healthy. We have been using Hydrofera Blue. I had some thoughts about running Apligraf then I still may do that however this looks so much better this week we will delay that for  now 3/5; the wound is small but about the same as last week. We have been using Hydrofera Blue. No debridement is required today. 3/19; the wound is about the size of a dime. Healthy looking wound even under illumination. We have been using Hydrofera Blue. No mechanical debridement is necessary 3/26; not much change from last week although still looks very healthy. We have been using Hydrofera Blue under Unna boots Patient was offered an ankle fusion by podiatry but not until the wound heals with a proceed with this. 4/9; the patient comes in today with her original wound on the medial ankle looking satisfactory however she  has some uncontrolled swelling in the middle part of her leg with 2 new open areas superiorly just lateral to the tibia. I think this was probably a wrap issue. She said she felt uncomfortable during the week but did not call in. We have been using Hydrofera Blue Objective Constitutional Sitting or standing Blood Pressure is within target range for patient.. Pulse regular and within target range for patient.Marland Kitchen Respirations regular, non-labored and within target range.. Temperature is normal and within the target range for the patient.Marland Kitchen Appears in no distress. Vitals Time Taken: 9:58 AM, Height: 68 in, Weight: 127 lbs, BMI: 19.3, Temperature: 98.1 F, Pulse: 70 bpm, Respiratory Rate: 18 breaths/min, Blood Pressure: 127/55 mmHg. Cardiovascular Peripheral pulses are palpable. General Notes: Wound exam; left medial malleolus under illumination actually looks quite healthy. This may actually be somewhat smaller than last time no mechanical debridement was necessary ooUnfortunately there is a localized area of swelling from just above where her original wound is on the ankle to 10 to 15 cm above this area. She has 2 small superficial wounds as a result of this. I think this was a wrap issue. I do not see any evidence of infection there is no localized tenderness Integumentary  (Hair, Skin) Wound #12 status is Open. Original cause of wound was Gradually Appeared. The wound is located on the Left,Proximal,Anterior Lower Leg. The wound measures 1.4cm length x 1.2cm width x 0.1cm depth; 1.319cm^2 area and 0.132cm^3 volume. There is Fat Layer (Subcutaneous Tissue) Exposed exposed. There is no tunneling or undermining noted. There is a medium amount of serosanguineous drainage noted. The wound margin is distinct with the outline attached to the wound base. There is large (67-100%) red, pink granulation within the wound bed. There is a small (1-33%) amount of necrotic tissue within the wound bed including Adherent Slough. Wound #13 status is Open. Original cause of wound was Gradually Appeared. The wound is located on the [facility] Lower Leg. The wound measures 0.4cm length x 0.8cm width x 0.1cm depth; 0.251cm^2 area and 0.025cm^3 volume. There is Fat Layer (Subcutaneous Tissue) Exposed exposed. There is no tunneling or undermining noted. There is a medium amount of serosanguineous drainage noted. The wound margin is distinct with the outline attached to the wound base. There is large (67-100%) red, pink granulation within the wound bed. There is a small (1-33%) amount of necrotic tissue within the wound bed including Adherent Slough. Wound #3 status is Open. Original cause of wound was Gradually Appeared. The wound is located on the Left,Medial Malleolus. The wound measures 1.2cm length x 1cm width x 0.1cm depth; 0.942cm^2 area and 0.094cm^3 volume. There is Fat Layer (Subcutaneous Tissue) Exposed exposed. There is no tunneling or undermining noted. There is a medium amount of serosanguineous drainage noted. The wound margin is flat and intact. There is large (67-100%) red, pink granulation within the wound bed. There is a small (1-33%) amount of necrotic tissue within the wound bed including Adherent Slough. 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 There was a Double Layer Compression Therapy Procedure by Kela Millin, RN. Post procedure Diagnosis Wound #12: Same as Pre-Procedure Wound #13 Pre-procedure diagnosis of Wound #13 is a Venous Leg Ulcer located on the Left,Distal,Anterior Lower Leg . There was a Double Layer Compression  Therapy Procedure by Kela Millin, RN. Post procedure Diagnosis Wound #13: Same as Pre-Procedure Wound #3 Pre-procedure diagnosis of Wound #3 is a Venous Leg Ulcer located on the Left,Medial Malleolus . There was a Double Layer Compression Therapy Procedure by Kela Millin, RN. Post procedure Diagnosis Wound #3: Same as Pre-Procedure Plan Follow-up Appointments: Return Appointment in 1 week. Dressing Change Frequency: Wound #3 Left,Medial Malleolus: Do not change entire dressing for one week. - change dressing in one week Skin Barriers/Peri-Wound Care: Moisturizing lotion TCA Cream or Ointment - mixed with lotion Wound Cleansing: Clean wound with Wound Cleanser - clean with anasept with dressing changes May shower with protection. Primary Wound Dressing: Wound #3 Left,Medial Malleolus: Hydrofera Blue - classic Secondary Dressing: Wound #3 Left,Medial Malleolus: Dry Gauze Edema Control: 2 Layer Compression System - Left Lower Extremity - Coflex Avoid standing for long periods of time Elevate legs to the level of the heart or above for 30 minutes daily and/or when sitting, a frequency of: - throughout the day Support Garment 20-30 mm/Hg pressure to: - compression stocking right leg daily 1. Hydrofera Blue classic to continue to all wound areas 2. We put a different compression system on herooCoflex 2 Electronic Signature(s) Signed:  09/12/2019 9:07:11 AM By: Linton Ham MD Entered By: Linton Ham on 09/09/2019 11:17:47 -------------------------------------------------------------------------------- SuperBill Details Patient Name: Date of Service: [name] 09/09/2019 Medical Record [id number] Patient Account Number: [id number] Date of Birth/Sex: Treating RN: 1955/09/19 (64 y.o. 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 L97.821 Non-pressure chronic ulcer of other part of left lower leg limited to breakdown of skin Facility Procedures CPT4 Code Description: [id number] (Facility Use Only) [id number] - [facility] LWR LT LEG Modifier: Quantity: 1 Physician Procedures CPT4 Code Description: [id number] - WC PHYS LEVEL 3 - EST PT ICD-10 Diagnosis Description L97.322 Non-pressure chronic ulcer of left ankle with fat layer exp L97.821 Non-pressure chronic ulcer of other part of left lower leg skin Modifier: osed limited to breakdo Quantity: 1 wn of Electronic Signature(s) Signed: 09/12/2019 9:07:11 AM By: Linton Ham MD Entered By: Linton Ham on 09/09/2019 11:18:41

## 2019-09-12 NOTE — Progress Notes (Signed)
ADANA, MARIK ([id number]) Visit Report for 09/09/2019 Arrival Information Details Patient Name: Date of Service: [name], [name] 09/09/2019 9:45 AM Medical Record [id number] Patient Account Number: [id number] Date of Birth/Sex: Treating RN: August 16, 1955 (64 y.o. [name], [name] Primary Care Darrion Wyszynski: [name] Other Clinician: Referring Dimonique Bourdeau: Treating Mckenlee Mangham/Extender:Robson, Tawny Asal in Treatment: 352 Visit Information History Since Last Visit Cane Added or deleted any medications: No Patient Arrived: 09:55 Any new allergies or adverse reactions: No Arrival Time: Had a fall or experienced change in No Accompanied By: self None activities of daily living that may affect Transfer Assistance: risk of falls: Patient Identification Verified: Yes Signs or symptoms of abuse/neglect since last No Secondary Verification Process Completed: Yes visito Patient Requires Transmission-Based No Hospitalized since last visit: No Precautions: Implantable device outside of the clinic excluding No Patient Has Alerts: No cellular tissue based products placed in the center since last visit: Has Dressing in Place as Prescribed: Yes Has Compression in Place as Prescribed: Yes Pain Present Now: Yes Electronic Signature(s) Signed: 09/09/2019 5:55:58 PM By: Deon Pilling Entered By: Deon Pilling on 09/09/2019 09:59:08 -------------------------------------------------------------------------------- Compression Therapy Details Patient Name: Date of Service: [name], [name] 09/09/2019 9:45 AM Medical Record [id number] Patient Account Number: [id number] Date of Birth/Sex: Treating RN: [birth year] (64 y.o. [name] Primary Care Dana Dorner: [name] Other Clinician: Referring Alexandrina Fiorini: Treating Jezabella Schriever/Extender:Robson, Tawny Asal in Treatment: 352 Compression Therapy Performed for Wound Wound #12 Left,Proximal,Anterior  Lower Leg Assessment: Performed By: Clinician Kela Millin, RN Compression Type: Double Layer Post Procedure Diagnosis Same as Pre-procedure Electronic Signature(s) Signed: 09/09/2019 6:04:52 PM By: Kela Millin Entered By: Kela Millin on 09/09/2019 10:44:28 -------------------------------------------------------------------------------- Compression Therapy Details Patient Name: Date of Service: [name], [name] 09/09/2019 9:45 AM Medical Record [id number] Patient Account Number: [id number] Date of Birth/Sex: Treating RN: 1955-12-06 (64 y.o. [name] Primary Care Milady Fleener: [name] Other Clinician: Referring Britley Gashi: Treating Priyah Schmuck/Extender:Robson, Tawny Asal in Treatment: 352 Compression Therapy Performed for Wound Wound #13 Left,Distal,Anterior Lower Leg Assessment: Performed By: Clinician Kela Millin, RN Compression Type: Double Layer Post Procedure Diagnosis Same as Pre-procedure Electronic Signature(s) Signed: 09/09/2019 6:04:52 PM By: Kela Millin Entered By: Kela Millin on 09/09/2019 10:44:28 -------------------------------------------------------------------------------- Compression Therapy Details Patient Name: Date of Service: [name], [name] 09/09/2019 9:45 AM Medical Record [id number] Patient Account Number: [id number] Date of Birth/Sex: Treating RN: Jun 03, 1955 (63 y.o. [name] Primary Care Remmington Teters: [name] Other Clinician: Referring Torunn Chancellor: Treating Ademide Schaberg/Extender:Robson, Tawny Asal in Treatment: 352 Compression Therapy Performed for Wound Wound #3 Left,Medial Malleolus Assessment: Performed By: Clinician Kela Millin, RN Compression Type: Double Layer Post Procedure Diagnosis Same as Pre-procedure Electronic Signature(s) Signed: 09/09/2019 6:04:52 PM By: Kela Millin Entered By: Kela Millin on 09/09/2019  10:44:28 -------------------------------------------------------------------------------- Encounter Discharge Information Details Patient Name: Date of Service: [name] 09/09/2019 9:45 AM Medical Record [id number] Patient Account Number: [id number] Date of Birth/Sex: Treating RN: 01/30/1956 (64 y.o. [name] Primary Care Azaiah Mello: [name] Other Clinician: Referring Jessenia Filippone: Treating Wilson Sample/Extender:Robson, Tawny Asal in Treatment: 352 Encounter Discharge Information Items Discharge Condition: Stable Ambulatory Status: Cane Discharge Destination: Home Transportation: Private Auto Accompanied By: self Schedule Follow-up Appointment: Yes Clinical Summary of Care: Electronic Signature(s) Signed: 09/09/2019 6:04:52 PM By: Kela Millin Entered By: Kela Millin on 09/09/2019 11:07:38 -------------------------------------------------------------------------------- Lower Extremity Assessment Details Patient Name: Date of Service: [name], [name] 09/09/2019 9:45 AM Medical Record [id number] Patient Account Number: [id number] Date of Birth/Sex: Treating RN: 1956/01/07 (63  y.o. [name] Primary Care Taylyn Brame: [name] Other Clinician: Referring Shanan Mcmiller: Treating Cordelro Gautreau/Extender:Robson, Velva Harman, Essie Christine in Treatment: 352 Edema Assessment Assessed: [Left: Yes] [Right: No] Edema: [Left: Ye] [Right: s] Calf Left: Right: Point of Measurement: 33 cm From Medial Instep 30 cm cm Ankle Left: Right: Point of Measurement: 12 cm From Medial Instep 22 cm cm Vascular Assessment Pulses: Dorsalis Pedis Palpable: [Left:Yes] Electronic Signature(s) Signed: 09/09/2019 5:55:58 PM By: Deon Pilling Entered By: Deon Pilling on 09/09/2019 10:01:51 -------------------------------------------------------------------------------- Multi Wound Chart Details Patient Name: Date of Service: [name]  09/09/2019 9:45 AM Medical Record [id number] Patient Account Number: [id number] Date of Birth/Sex: Treating RN: [birth year] (64 y.o. [name] Primary Care Kourtney Terriquez: [name] Other Clinician: Referring Chattie Greeson: Treating Grayson White/Extender:Robson, Velva Harman, Essie Christine in Treatment: 352 Vital Signs Height(in): 68 Pulse(bpm): 70 Weight(lbs): 127 Blood Pressure(mmHg): 127/55 Body Mass Index(BMI): 19 Temperature(F): 98.1 Respiratory 18 Rate(breaths/min): Photos: [12:No Photos] [13:No Photos] Wound Location: [12:Left, Proximal, Anterior Lower Leg] [13:Left, Distal, Anterior Lower Leg] Wounding Event: [12:Gradually Appeared] [13:Gradually Appeared] Primary Etiology: [12:Venous Leg Ulcer] [13:Venous Leg Ulcer] Comorbid History: [12:Congestive Heart Failure, Peripheral Vascular Disease, Congestive Heart Failure, End Stage Renal Disease, Tobacco Use, Chronic Obstructive Pulmonary Disease (COPD), Type 1 Diabetes] [13:Congestive Heart Failure, Peripheral Vascular  Disease, Congestive Heart Failure, End Stage Renal Disease, Tobacco Use, Chronic Obstructive Pulmonary Disease (COPD), Type 1 Diabetes] Date Acquired: [12:09/09/2019] [13:09/09/2019] Weeks of Treatment: [12:0] [13:0] Wound Status: [12:Open] [13:Open] Measurements L x W x D 1.4x1.2x0.1 [13:0.4x0.8x0.1] (cm) Area (cm) : [12:1.319] [13:0.251] Volume (cm) : [12:0.132] [13:0.025] % Reduction in Area: [12:N/A] [13:N/A] % Reduction in Volume: [12:N/A] [13:N/A] Classification: [12:Full Thickness Without Exposed Support Structures Exposed Support Structures Exposed Support Structures] [13:Full Thickness Without] Exudate Amount: [12:Medium] [13:Medium] Exudate Type: [12:Serosanguineous] [13:Serosanguineous] Exudate Color: [12:red, brown] [13:red, brown] Wound Margin: [12:Distinct, outline attached Distinct, outline attached Flat and Intact] Granulation Amount: [12:Large (67-100%)] [13:Large (67-100%)] Granulation  Quality: [12:Red, Pink] [13:Red, Pink] Necrotic Amount: [12:Small (1-33%)] [13:Small (1-33%)] Exposed Structures: [12:Fat Layer (Subcutaneous Fat Layer (Subcutaneous Fat Layer (Subcutaneous Tissue) Exposed: Yes Fascia: No Tendon: No Muscle: No Joint: No Bone: No] [13:Tissue) Exposed: Yes Fascia: No Tendon: No Muscle: No Joint: No Bone: No] Epithelialization: [12:Small (1-33%) Compression Therapy] [13:Small (1-33%) Compression Therapy] Treatment Notes Wound #12 (Left, Proximal, Anterior Lower Leg) 1. Cleanse With Soap and water 2. Periwound Care Barrier cream TCA Cream 3. Primary Dressing Applied Foam 4. Secondary Dressing Dry Gauze Notes coflex 2 layer compression wrap used. Wound #13 (Left, Distal, Anterior Lower Leg) 1. Cleanse With Soap and water 2. Periwound Care Barrier cream TCA Cream 3. Primary Dressing Applied Foam 4. Secondary Dressing Dry Gauze Notes coflex 2 layer compression wrap used. Wound #3 (Left, Medial Malleolus) 1. Cleanse With Soap and water 2. Periwound Care Barrier cream TCA Ointment 3. Primary Dressing Applied Hydrofera Blue 4. Secondary Dressing Dry Gauze Foam Kerramax/Xtrasorb Notes hydrofera classic. coflex 2 layer compression wrap used. Electronic Signature(s) Signed: 09/09/2019 6:04:52 PM By: Kela Millin Signed: 09/12/2019 9:07:11 AM By: Linton Ham MD Entered By: Linton Ham on 09/09/2019 11:13:50 -------------------------------------------------------------------------------- Multi-Disciplinary Care Plan Details Patient Name: Date of Service: [name], [name] 09/09/2019 9:45 AM Medical Record [id number] Patient Account Number: [id number] Date of Birth/Sex: Treating RN: 1956-02-04 (64 y.o. [name] Primary Care Maxamillian Tienda: [name] Other Clinician: Referring Markeisha Mancias: Treating Samarie Pinder/Extender:Robson, Tawny Asal in Treatment: 352 Active Inactive Abuse / Safety / Falls / Self  Care Management Nursing Diagnoses: History of Falls Potential for falls  Goals: Patient will not experience any injury related to falls Date Initiated: 08/05/2019 Target Resolution Date: 10/07/2019 Goal Status: Active Patient/caregiver will verbalize/demonstrate measures taken to prevent injury and/or falls Date Initiated: 08/05/2019 Target Resolution Date: 10/07/2019 Goal Status: Active Interventions: Assess fall risk on admission and as needed Assess impairment of mobility on admission and as needed per policy Notes: Venous Leg Ulcer Nursing Diagnoses: Actual venous Insuffiency (use after diagnosis is confirmed) Goals: Patient will maintain optimal edema control Date Initiated: 09/04/2017 Target Resolution Date: 10/07/2019 Goal Status: Active Patient/caregiver will verbalize understanding of disease process and disease management Date Initiated: 08/03/2014 Date Inactivated: 08/28/2017 Target Resolution Date: 08/29/2017 Goal Status: Met Interventions: Compression as ordered Treatment Activities: Therapeutic compression applied : 09/21/2015 Notes: Wound/Skin Impairment Nursing Diagnoses: Impaired tissue integrity Knowledge deficit related to ulceration/compromised skin integrity Goals: Patient will have a decrease in wound volume by X% from date: (specify in notes) Date Initiated: 04/05/2015 Date Inactivated: 02/12/2017 Target Resolution Date: 09/01/2016 Unmet Reason: chronic, Goal Status: Unmet unable to tolerate stronger compression Patient/caregiver will verbalize understanding of skin care regimen Date Initiated: 04/05/2015 Target Resolution Date: 09/16/2019 Goal Status: Active Interventions: Assess patient/caregiver ability to obtain necessary supplies Assess patient/caregiver ability to perform ulcer/skin care regimen upon admission and as needed Assess ulceration(s) every visit Provide education on ulcer and skin care Notes: Electronic Signature(s) Signed: 09/09/2019 6:04:52 PM  By: Kela Millin Entered By: Kela Millin on 09/09/2019 09:59:51 -------------------------------------------------------------------------------- Pain Assessment Details Patient Name: Date of Service: [name], [name] 09/09/2019 9:45 AM Medical Record [id number] Patient Account Number: [id number] Date of Birth/Sex: Treating RN: [birth year] (64 y.o. [name] Primary Care Kelly Eisler: [name] Other Clinician: Referring Hermann Dottavio: Treating Charnika Herbst/Extender:Robson, Tawny Asal in Treatment: 352 Active Problems Location of Pain Severity and Description of Pain Patient Has Paino Yes Site Locations Pain Location: Generalized Pain, Pain in Ulcers Rate the pain. Current Pain Level: 6 Worst Pain Level: 10 Least Pain Level: 0 Tolerable Pain Level: 8 Pain Management and Medication Current Pain Management: Medication: Yes Cold Application: No Rest: Yes Massage: No Activity: No T.E.N.S.: No Heat Application: No Leg drop or elevation: Yes Is the Current Pain Management Adequate: Adequate How does your wound impact your activities of daily livingo Sleep: No Bathing: No Appetite: No Relationship With Others: No Bladder Continence: No Emotions: No Bowel Continence: No Work: No Toileting: No Drive: No Dressing: No Hobbies: No Electronic Signature(s) Signed: 09/09/2019 5:55:58 PM By: Deon Pilling Entered By: Deon Pilling on 09/09/2019 10:01:37 -------------------------------------------------------------------------------- Patient/Caregiver Education Details Patient Name: Date of Service: [name] 4/9/2021andnbsp9:45 AM Medical Record [id number] Patient Account Number: [id number] Date of Birth/Gender: Treating RN: 1955/07/02 (63 y.o. [name] Primary Care Physician: [name] Other Clinician: Referring Physician: Treating Physician/Extender:Robson, Tawny Asal in Treatment:  45 Education Assessment Education Provided To: Patient Education Topics Provided Wound/Skin Impairment: Methods: Explain/Verbal Responses: State content correctly Electronic Signature(s) Signed: 09/09/2019 6:04:52 PM By: Kela Millin Entered By: Kela Millin on 09/09/2019 10:18:45 -------------------------------------------------------------------------------- Wound Assessment Details Patient Name: Date of Service: [name], [name] 09/09/2019 9:45 AM Medical Record [id number] Patient Account Number: [id number] Date of Birth/Sex: Treating RN: 1956-01-15 (64 y.o. [name] Primary Care Eleanora Guinyard: [name] Other Clinician: Referring Anneli Bing: Treating Emmajean Ratledge/Extender:Robson, Tawny Asal in Treatment: 352 Wound Status Wound Number: 12 Primary Venous Leg Ulcer Etiology: Wound Location: Left, Proximal, Anterior Lower Leg Wound Open Wounding Event: Gradually Appeared Status: Date Acquired: 09/09/2019 Comorbid Congestive Heart Failure, Peripheral Vascular Weeks Of Treatment: 0 History:  Disease, Congestive Heart Failure, End Stage Clustered Wound: No Renal Disease, Tobacco Use, Chronic Obstructive Pulmonary Disease (COPD), Type 1 Diabetes Wound Measurements Length: (cm) 1.4 % Reduct Width: (cm) 1.2 % Reduct Depth: (cm) 0.1 Epitheli Area: (cm) 1.319 Tunneli Volume: (cm) 0.132 Undermi Wound Description Classification: Full Thickness Without Exposed Support Foul Od Structures Slough/ Wound Distinct, outline attached Margin: Exudate Medium Amount: Exudate Serosanguineous Type: Exudate red, brown Color: Wound Bed Granulation Amount: Large (67-100%) Granulation Quality: Red, Pink Fascia E Necrotic Amount: Small (1-33%) Fat Layer Necrotic Quality: Adherent Slough Tendon Ex Muscle Ex Joint Exp Bone Expo or After Cleansing: No Fibrino Yes Exposed Structure xposed: No (Subcutaneous Tissue) Exposed: Yes posed: No posed:  No osed: No sed: No ion in Area: ion in Volume: alization: Small (1-33%) ng: No ning: No Treatment Notes Wound #12 (Left, Proximal, Anterior Lower Leg) 1. Cleanse With Soap and water 2. Periwound Care Barrier cream TCA Cream 3. Primary Dressing Applied Foam 4. Secondary Dressing Dry Gauze Notes coflex 2 layer compression wrap used. Electronic Signature(s) Signed: 09/09/2019 5:55:58 PM By: Deon Pilling Entered By: Deon Pilling on 09/09/2019 10:06:57 -------------------------------------------------------------------------------- Wound Assessment Details Patient Name: Date of Service: [name], [name] 09/09/2019 9:45 AM Medical Record [id number] Patient Account Number: [id number] Date of Birth/Sex: Treating RN: 23-Apr-1956 (64 y.o. [name], [name] Primary Care Campbell Agramonte: [name] Other Clinician: Referring Thomas Rhude: Treating Janei Scheff/Extender:Robson, Velva Harman, Essie Christine in Treatment: 352 Wound Status Wound Number: 13 Primary Venous Leg Ulcer Etiology: Wound Location: Left, Distal, Anterior Lower Leg Wound Open Wounding Event: Gradually Appeared Status: Date Acquired: 09/09/2019 Comorbid Congestive Heart Failure, Peripheral Vascular Weeks Of Treatment: 0 History: Disease, Congestive Heart Failure, End Stage Clustered Wound: No Renal Disease, Tobacco Use, Chronic Obstructive Pulmonary Disease (COPD), Type 1 Diabetes Wound Measurements Length: (cm) 0.4 % Reductio Width: (cm) 0.8 % Reductio Depth: (cm) 0.1 Epithelial Area: (cm) 0.251 Tunneling Volume: (cm) 0.025 Undermini Wound Description Full Thickness Without Exposed Support Foul Od Classification: Structures Slough/ Wound Distinct, outline attached Margin: Exudate Medium Amount: Exudate Serosanguineous Type: Exudate red, brown Color: Wound Bed Granulation Amount: Large (67-100%) Granulation Quality: Red, Pink Fascia E Necrotic Amount: Small (1-33%) Fat Laye Necrotic Quality:  Adherent Slough Tendon E Muscle E Joint Ex Bone Exp or After Cleansing: No Fibrino Yes Exposed Structure xposed: No r (Subcutaneous Tissue) Exposed: Yes xposed: No xposed: No posed: No osed: No n in Area: n in Volume: ization: Small (1-33%) : No ng: No Treatment Notes Wound #13 (Left, Distal, Anterior Lower Leg) 1. Cleanse With Soap and water 2. Periwound Care Barrier cream TCA Cream 3. Primary Dressing Applied Foam 4. Secondary Dressing Dry Gauze Notes coflex 2 layer compression wrap used. Electronic Signature(s) Signed: 09/09/2019 5:55:58 PM By: Deon Pilling Entered By: Deon Pilling on 09/09/2019 10:08:56 -------------------------------------------------------------------------------- Wound Assessment Details Patient Name: Date of Service: [name], [name] 09/09/2019 9:45 AM Medical Record [id number] Patient Account Number: [id number] Date of Birth/Sex: Treating RN: 1955/07/19 (64 y.o. [name], [name] Primary Care Nekeisha Aure: [name] Other Clinician: Referring Jshaun Abernathy: Treating Zae Kirtz/Extender:Robson, Velva Harman, Essie Christine in Treatment: 352 Wound Status Wound Number: 3 Primary Venous Leg Ulcer Etiology: Wound Location: Left, Medial Malleolus Wound Open Wounding Event: Gradually Appeared Status: Status: Date Acquired: 11/15/2012 Comorbid Congestive Heart Failure, Peripheral Vascular Weeks Of Treatment: 352 History: Disease, Congestive Heart Failure, End Stage Clustered Wound: No Renal Disease, Tobacco Use, Chronic Obstructive Pulmonary Disease (COPD), Type 1 Diabetes Wound Measurements Length: (cm) 1.2 % Redu Width: (cm) 1 % Redu Depth: (cm) 0.1  Epithe Area: (cm) 0.942 Tunne Volume: (cm) 0.094 Under Wound Description Classification: Full Thickness Without Exposed Support Foul O Structures Slough Wound Flat and Intact Margin: Exudate Medium Amount: Exudate Serosanguineous Type: Exudate red, brown Color: Wound  Bed Granulation Amount: Large (67-100%) Granulation Quality: Red, Pink Fascia Necrotic Amount: Small (1-33%) Fat La Necrotic Quality: Adherent Slough Tendon Muscle Joint Bone E dor After Cleansing: No /Fibrino Yes Exposed Structure Exposed: No yer (Subcutaneous Tissue) Exposed: Yes Exposed: No Exposed: No Exposed: No xposed: No ction in Area: 70% ction in Volume: 85% lialization: Medium (34-66%) ling: No mining: No Treatment Notes Wound #3 (Left, Medial Malleolus) 1. Cleanse With Soap and water 2. Periwound Care Barrier cream TCA Ointment 3. Primary Dressing Applied Hydrofera Blue 4. Secondary Dressing Dry Gauze Foam Kerramax/Xtrasorb Notes hydrofera classic. coflex 2 layer compression wrap used. Electronic Signature(s) Signed: 09/09/2019 5:55:58 PM By: Deon Pilling Entered By: Deon Pilling on 09/09/2019 10:07:54 -------------------------------------------------------------------------------- Vitals Details Patient Name: Date of Service: [name] 09/09/2019 9:45 AM Medical Record [id number] Patient Account Number: [id number] Date of Birth/Sex: Treating RN: 12-16-1955 (64 y.o. [name] Primary Care Momodou Consiglio: [name] Other Clinician: Referring Milley Vining: Treating Roshawn Ayala/Extender:Robson, Tawny Asal in Treatment: 352 Vital Signs Time Taken: 09:58 Temperature (F): 98.1 Height (in): 68 Pulse (bpm): 70 Weight (lbs): 127 Respiratory Rate (breaths/min): 18 Body Mass Index (BMI): 19.3 Blood Pressure (mmHg): 127/55 Reference Range: 80 - 120 mg / dl Electronic Signature(s) Signed: 09/09/2019 5:55:58 PM By: Deon Pilling Entered By: Deon Pilling on 09/09/2019 09:59:24

## 2019-09-14 NOTE — Progress Notes (Signed)
[name], [name] ([id number]) Visit Report for 07/29/2019 Arrival Information Details Patient Name: Date of Service: [name] [name] 07/29/2019 10:00 AM Medical Record [id number] Patient Account Number: [id number] Date of Birth/Sex: Treating RN: 03-21-1956 (64 y.o. F) Dwiggins, Larene Beach Primary Care Leny Morozov: [name] Other Clinician: Referring Mattheu Brodersen: Treating Rhian Asebedo/Extender:Robson, Tawny Asal in Treatment: 346 Visit Information History Since Last Visit Cane Added or deleted any medications: No Patient Arrived: 10:33 Any new allergies or adverse reactions: No Arrival Time: Had a fall or experienced change in No Accompanied By: self None activities of daily living that may affect Transfer Assistance: risk of falls: Patient Identification Verified: Yes Signs or symptoms of abuse/neglect since last No Secondary Verification Process Completed: Yes visito Patient Requires Transmission-Based No Hospitalized since last visit: No Precautions: Implantable device outside of the clinic excluding No Patient Has Alerts: No cellular tissue based products placed in the center since last visit: Has Dressing in Place as Prescribed: Yes Pain Present Now: Yes Electronic Signature(s) Signed: 09/14/2019 9:23:26 AM By: [name] Entered By: [name] on 07/29/2019 10:34:44 -------------------------------------------------------------------------------- Compression Therapy Details Patient Name: Date of Service: [name] [name] 07/29/2019 10:00 AM Medical Record [id number] Patient Account Number: [id number] Date of Birth/Sex: Treating RN: [birth year] (64 y.o. [name] Primary Care Myishia Kasik: [name] Other Clinician: Referring Velda Wendt: Treating Shian Goodnow/Extender:Robson, Tawny Asal in Treatment: 346 Compression Therapy Performed for Wound Wound #3 Left,Medial Malleolus Assessment: Performed By:  Clinician Deon Pilling, RN Compression Type: Rolena Infante Post Procedure Diagnosis Same as Pre-procedure Electronic Signature(s) Signed: 07/29/2019 5:17:01 PM By: Kela Millin Entered By: Kela Millin on 07/29/2019 11:10:36 -------------------------------------------------------------------------------- Encounter Discharge Information Details Patient Name: Date of Service: [name] [name] 07/29/2019 10:00 AM Medical Record [id number] Patient Account Number: [id number] Date of Birth/Sex: Treating RN: November 19, 1955 (64 y.o. [name] Primary Care Wilmoth Rasnic: [name] Other Clinician: Referring Jahmai Finelli: Treating Elisabetta Mishra/Extender:Robson, Tawny Asal in Treatment: [phone] Encounter Discharge Information Items Discharge Condition: Stable Ambulatory Status: Cane Discharge Destination: Home Transportation: Private Auto Accompanied By: self Schedule Follow-up Appointment: Yes Clinical Summary of Care: Electronic Signature(s) Signed: 07/29/2019 4:47:37 PM By: Deon Pilling Entered By: Deon Pilling on 07/29/2019 11:42:43 -------------------------------------------------------------------------------- Lower Extremity Assessment Details Patient Name: Date of Service: [name], [name] 07/29/2019 10:00 AM Medical Record [id number] Patient Account Number: [id number] Date of Birth/Sex: Treating RN: March 05, 1956 (64 y.o. [name] Seynabou Fults: [name] Other Clinician: Referring Kenyatta Gloeckner: Treating Lexey Fletes/Extender:Robson, Tawny Asal in Treatment: 346 Edema Assessment Assessed: [Left: No] [Right: No] Edema: [Left: N] [Right: o] Calf Left: Right: Point of Measurement: 33 cm From Medial Instep 29.2 cm cm Ankle Left: Right: Point of Measurement: 12 cm From Medial Instep 21.5 cm cm Vascular Assessment Pulses: Dorsalis Pedis Palpable: [Left:Yes] Electronic Signature(s) Signed: 07/29/2019 5:01:55 PM By:  Baruch Gouty RN, BSN Entered By: Baruch Gouty on 07/29/2019 10:42:30 -------------------------------------------------------------------------------- Multi Wound Chart Details Patient Name: Date of Service: [name] [name] 07/29/2019 10:00 AM Medical Record [id number] Patient Account Number: [id number] Date of Birth/Sex: Treating RN: [birth year] (64 y.o. [name] Primary Care Elisa Kutner: [name] Other Clinician: Referring Aissata Wilmore: Treating Cyle Kenyon/Extender:Robson, Tawny Asal in Treatment: 346 Vital Signs Height(in): 68 Pulse(bpm): 59 Weight(lbs): 127 Blood Pressure(mmHg): 110/54 Body Mass Index(BMI): 19 Temperature(F): 98.3 Respiratory 18 Rate(breaths/min): Photos: [3:No Photos] [N/A:N/A] Wound Location: [3:Left Malleolus - Medial] [N/A:N/A] Wounding Event: [3:Gradually Appeared] [N/A:N/A] Primary Etiology: [3:Venous Leg Ulcer] [N/A:N/A] Comorbid History: [3:Congestive Heart Failure, Peripheral Vascular Disease, Congestive Heart Failure, End Stage Renal  Disease, Tobacco Use, Chronic Obstructive Pulmonary Disease (COPD), Type 1 Diabetes] [N/A:N/A] Date Acquired: [3:11/15/2012] [N/A:N/A] Weeks of Treatment: [3:346] [N/A:N/A] Wound Status: [3:Open] [N/A:N/A] Measurements L x W x D 1.2x0.8x0.1 [N/A:N/A] (cm) Area (cm) : [3:0.754] [N/A:N/A] Volume (cm) : [3:0.075] [N/A:N/A] % Reduction in Area: [3:76.00%] [N/A:N/A] % Reduction in Volume: [3:88.10%] [N/A:N/A] Classification: [3:Full Thickness Without Exposed Support Structures] [N/A:N/A] Exudate Amount: [3:Small] [N/A:N/A] Exudate Type: [3:Serosanguineous] [N/A:N/A] Exudate Color: [3:red, brown] [N/A:N/A] Wound Margin: [3:Flat and Intact] [N/A:N/A] Granulation Amount: [3:Large (67-100%)] [N/A:N/A] Granulation Quality: [3:Red, Pink] [N/A:N/A] Necrotic Amount: [3:Small (1-33%)] [N/A:N/A] Exposed Structures: [3:Fat Layer (Subcutaneous N/A Tissue) Exposed: Yes Fascia: No Tendon:  No Muscle: No Joint: No Bone: No] Epithelialization: [3:Small (1-33%) Compression Therapy] [N/A:N/A N/A] Treatment Notes Electronic Signature(s) Signed: 07/29/2019 5:17:01 PM By: Kela Millin Signed: 07/29/2019 5:26:28 PM By: Linton Ham MD Entered By: Linton Ham on 07/29/2019 11:26:21 -------------------------------------------------------------------------------- Multi-Disciplinary Care Plan Details Patient Name: Date of Service: [name] [name] 07/29/2019 10:00 AM Medical Record [id number] Patient Account Number: [id number] Date of Birth/Sex: Treating RN: [birth year] (64 y.o. [name] Primary Care Marisue Canion: [name] Other Clinician: Referring Reeanna Acri: Treating Sanjana Folz/Extender:Robson, Tawny Asal in Treatment: [phone] Active Inactive Venous Leg Ulcer Nursing Diagnoses: Actual venous Insuffiency (use after diagnosis is confirmed) Goals: Patient will maintain optimal edema control Date Initiated: 09/04/2017 Target Resolution Date: 08/12/2019 Goal Status: Active Patient/caregiver will verbalize understanding of disease process and disease management Date Initiated: 08/03/2014 Date Inactivated: 08/28/2017 Target Resolution Date: 08/29/2017 Goal Status: Met Interventions: Compression as ordered Treatment Activities: Therapeutic compression applied : 09/21/2015 Notes: Wound/Skin Impairment Nursing Diagnoses: Impaired tissue integrity Knowledge deficit related to ulceration/compromised skin integrity Goals: Patient will have a decrease in wound volume by X% from date: (specify in notes) Date Initiated: 04/05/2015 Date Inactivated: 02/12/2017 Target Resolution Date: 09/01/2016 Unmet Reason: chronic, Goal Status: Unmet unable to tolerate stronger compression Patient/caregiver will verbalize understanding of skin care regimen Date Initiated: 04/05/2015 Target Resolution Date: 08/12/2019 Goal Status: Active Interventions: Assess  patient/caregiver ability to obtain necessary supplies Assess patient/caregiver ability to perform ulcer/skin care regimen upon admission and as needed Assess ulceration(s) every visit Provide education on ulcer and skin care Notes: Electronic Signature(s) Signed: 07/29/2019 5:17:01 PM By: Kela Millin Entered By: Kela Millin on 07/29/2019 11:11:57 -------------------------------------------------------------------------------- Pain Assessment Details Patient Name: Date of Service: [name], [name] 07/29/2019 10:00 AM Medical Record [id number] Patient Account Number: [id number] Date of Birth/Sex: Treating RN: [birth year] (63 y.o. [name] Primary Care Korvin Valentine: [name] Other Clinician: Referring Madhavi Hamblen: Treating Ramsie Ostrander/Extender:Robson, Tawny Asal in Treatment: [phone] Active Problems Location of Pain Severity and Description of Pain Patient Has Paino Yes Site Locations Site Locations Pain Location: Pain in Ulcers With Dressing Change: Yes Duration of the Pain. Constant / Intermittento Constant Rate the pain. Current Pain Level: 5 Worst Pain Level: 7 Least Pain Level: 4 Character of Pain Describe the Pain: Burning, Sharp, Stabbing Pain Management and Medication Current Pain Management: Medication: Yes Other: tolerable Is the Current Pain Management Adequate: Adequate How does your wound impact your activities of daily livingo Sleep: Yes Bathing: No Appetite: No Relationship With Others: No Bladder Continence: No Emotions: Yes Bowel Continence: No Work: No Toileting: No Drive: No Dressing: No Hobbies: No Electronic Signature(s) Signed: 07/29/2019 5:01:55 PM By: Baruch Gouty RN, BSN Signed: 07/29/2019 5:17:01 PM By: Kela Millin Entered By: Baruch Gouty on 07/29/2019 10:45:42 -------------------------------------------------------------------------------- Patient/Caregiver Education Details Patient  Name: Date of Service: Eriksson, Rashea G. 2/26/2021andnbsp10:00 AM Medical Record [phone] Patient Account  Number: [id number] Date of Birth/Gender: Treating RN: March 21, 1956 (64 y.o. [name] Primary Care Physician: [name] Other Clinician: Referring Physician: Treating Physician/Extender:Robson, Tawny Asal in Treatment: [phone] Education Assessment Education Provided To: Patient Education Topics Provided Wound/Skin Impairment: Methods: Explain/Verbal Responses: State content correctly Electronic Signature(s) Signed: 07/29/2019 5:17:01 PM By: Kela Millin Entered By: Kela Millin on 07/29/2019 11:12:16 -------------------------------------------------------------------------------- Wound Assessment Details Patient Name: Date of Service: [name], [name] 07/29/2019 10:00 AM Medical Record [id number] Patient Account Number: [id number] Date of Birth/Sex: Treating RN: [birth year] (64 y.o. [name] Primary Care Stepheny Canal: [name] Other Clinician: Referring Jyrah Blye: Treating Mccall Lomax/Extender:Robson, Tawny Asal in Treatment: 346 Wound Status Wound Number: 3 Primary Venous Leg Ulcer Etiology: Wound Location: Left Malleolus - Medial Wound Open Wounding Event: Gradually Appeared Status: Date Acquired: 11/15/2012 Comorbid Congestive Heart Failure, Peripheral Vascular Weeks Of Treatment: 346 History: Disease, Congestive Heart Failure, End Stage Clustered Wound: No Renal Disease, Tobacco Use, Chronic Obstructive Pulmonary Disease (COPD), Type 1 Diabetes Photos Wound Measurements Length: (cm) 1.2 % Reduc Width: (cm) 0.8 % Reduc Depth: (cm) 0.1 Epithel Area: (cm) 0.754 Tunnel Volume: (cm) 0.075 Underm Wound Description Full Thickness Without Exposed Support Foul O Classification: Structures Slough Wound Flat and Intact Margin: Exudate Small Amount: Exudate Exudate  Serosanguineous Type: Exudate red, brown Color: Wound Bed Granulation Amount: Large (67-100%) Granulation Quality: Red, Pink Fascia Expo Necrotic Amount: Small (1-33%) Fat Layer ( Necrotic Quality: Adherent Slough Tendon Expo Muscle Expo Joint Expos Bone Expose dor After Cleansing: No /Fibrino No Exposed Structure sed: No Subcutaneous Tissue) Exposed: Yes sed: No sed: No ed: No d: No tion in Area: 76% tion in Volume: 88.1% ialization: Small (1-33%) ing: No ining: No Electronic Signature(s) Signed: 07/29/2019 5:12:28 PM By: Mikeal Hawthorne EMT/HBOT Signed: 07/29/2019 5:17:01 PM By: Kela Millin Entered By: Mikeal Hawthorne on 07/29/2019 14:53:57 -------------------------------------------------------------------------------- Vitals Details Patient Name: Date of Service: [name] [name] 07/29/2019 10:00 AM Medical Record [id number] Patient Account Number: [id number] Date of Birth/Sex: Treating RN: 1955-06-17 (64 y.o. [name] Primary Care Othel Hoogendoorn: [name] Other Clinician: Referring Kerrie Timm: Treating Willeen Novak/Extender:Robson, Tawny Asal in Treatment: 346 Vital Signs Time Taken: 10:34 Temperature (F): 98.3 Height (in): 68 Pulse (bpm): 59 Weight (lbs): 127 Respiratory Rate (breaths/min): 18 Body Mass Index (BMI): 19.3 Blood Pressure (mmHg): 110/54 Reference Range: 80 - 120 mg / dl Electronic Signature(s) Signed: 09/14/2019 9:23:26 AM By: [name] Entered By: [name] on 07/29/2019 10:35:05

## 2019-09-14 NOTE — Progress Notes (Signed)
[name], [name] ([id number]) Visit Report for 08/05/2019 Arrival Information Details Patient Name: Date of Service: [name], [name] 08/05/2019 10:15 AM Medical Record [id number] Patient Account Number: [id number] Date of Birth/Sex: Treating RN: 1955-08-06 (64 y.o. F) Dwiggins, Larene Beach Primary Care Lilinoe Acklin: [name] Other Clinician: Referring Adia Crammer: Treating Sequoyah Ramone/Extender:Robson, Tawny Asal in Treatment: 347 Visit Information History Since Last Visit Cane Added or deleted any medications: No Patient Arrived: 10:48 Any new allergies or adverse reactions: No Arrival Time: Had a fall or experienced change in Yes Accompanied By: self None activities of daily living that may affect Transfer Assistance: risk of falls: Patient Identification Verified: Yes Signs or symptoms of abuse/neglect since last No Secondary Verification Process Completed: Yes visito Patient Requires Transmission-Based No Hospitalized since last visit: No Precautions: Implantable device outside of the clinic excluding No Patient Has Alerts: No cellular tissue based products placed in the center since last visit: Has Dressing in Place as Prescribed: Yes Pain Present Now: Yes Electronic Signature(s) Signed: 09/14/2019 9:21:08 AM By: [name] Entered By: [name] on 08/05/2019 10:50:32 -------------------------------------------------------------------------------- Compression Therapy Details Patient Name: Date of Service: [name] 08/05/2019 10:15 AM Medical Record [id number] Patient Account Number: [id number] Date of Birth/Sex: Treating RN: 1955/07/16 (64 y.o. [name] Shalita Notte: [name] Other Clinician: Referring Diego Delancey: Treating Moshe Wenger/Extender:Robson, Tawny Asal in Treatment: 347 Compression Therapy Performed for Wound Wound #3 Left,Medial Malleolus Assessment: Performed By: Clinician  Deon Pilling, RN Compression Type: Rolena Infante Post Procedure Diagnosis Same as Pre-procedure Electronic Signature(s) Signed: 08/05/2019 5:33:53 PM By: Baruch Gouty RN, BSN Entered By: Baruch Gouty on 08/05/2019 11:29:43 -------------------------------------------------------------------------------- Encounter Discharge Information Details Patient Name: Date of Service: [name] 08/05/2019 10:15 AM Medical Record [id number] Patient Account Number: [id number] Date of Birth/Sex: Treating RN: June 10, 1955 (64 y.o. [name] Primary Care Retta Pitcher: [name] Other Clinician: Referring Rebecka Oelkers: Treating Jaeveon Ashland/Extender:Robson, Tawny Asal in Treatment: [phone] Encounter Discharge Information Items Discharge Condition: Stable Ambulatory Status: Cane Discharge Destination: Home Transportation: Private Auto Accompanied By: self Schedule Follow-up Appointment: Yes Clinical Summary of Care: Electronic Signature(s) Signed: 08/05/2019 5:34:19 PM By: Deon Pilling Entered By: Deon Pilling on 08/05/2019 11:49:32 -------------------------------------------------------------------------------- Lower Extremity Assessment Details Patient Name: Date of Service: [name], [name] 08/05/2019 10:15 AM Medical Record [id number] Patient Account Number: [id number] Date of Birth/Sex: Treating RN: Aug 07, 1955 (64 y.o. [name] Primary Care Sayf Kerner: [name] Other Clinician: Referring Isabella Ida: Treating Stephane Niemann/Extender:Robson, Tawny Asal in Treatment: 347 Edema Assessment Assessed: [Left: No] [Right: No] Edema: [Left: N] [Right: o] Calf Left: Right: Point of Measurement: 33 cm From Medial Instep 29.2 cm cm Ankle Left: Right: Point of Measurement: 12 cm From Medial Instep 21.5 cm cm Vascular Assessment Pulses: Dorsalis Pedis Palpable: [Left:Yes] Electronic Signature(s) Signed: 08/08/2019 5:59:01 PM By: Levan Hurst  RN, BSN Entered By: Levan Hurst on 08/05/2019 11:05:34 -------------------------------------------------------------------------------- Multi Wound Chart Details Patient Name: Date of Service: [name] 08/05/2019 10:15 AM Medical Record [id number] Patient Account Number: [id number] Date of Birth/Sex: Treating RN: 20-Jan-1956 (64 y.o. [name] Primary Care Suhailah Kwan: [name] Other Clinician: Referring Timberlyn Pickford: Treating Shardea Cwynar/Extender:Robson, Tawny Asal in Treatment: 347 Vital Signs Height(in): 68 Pulse(bpm): 61 Weight(lbs): 127 Blood Pressure(mmHg): 117/50 Body Mass Index(BMI): 19 Temperature(F): 98.4 Respiratory 18 Rate(breaths/min): Photos: [3:No Photos] [N/A:N/A] Wound Location: [3:Left Malleolus - Medial] [N/A:N/A] Wounding Event: [3:Gradually Appeared] [N/A:N/A] Primary Etiology: [3:Venous Leg Ulcer] [N/A:N/A] Comorbid History: [3:Congestive Heart Failure, Peripheral Vascular Disease, Congestive Heart Failure, End  Stage Renal Disease, Tobacco Use, Chronic Obstructive Pulmonary Disease (COPD), Type 1 Diabetes] [N/A:N/A] Date Acquired: [3:11/15/2012] [N/A:N/A] Weeks of Treatment: [3:347] [N/A:N/A] Wound Status: [3:Open] [N/A:N/A] Measurements L x W x D 1x0.9x0.1 [N/A:N/A] (cm) Area (cm) : [3:1.540] [N/A:N/A] Volume (cm) : [3:0.071] [N/A:N/A] % Reduction in Area: [3:77.50%] [N/A:N/A] % Reduction in Volume: [3:88.70%] [N/A:N/A] Classification: [3:Full Thickness Without Exposed Support Structures] [N/A:N/A] Exudate Amount: [3:Small] [N/A:N/A] Exudate Type: [3:Serosanguineous] [N/A:N/A] Exudate Color: [3:red, brown] [N/A:N/A] Wound Margin: [3:Flat and Intact] [N/A:N/A] Granulation Amount: [3:Large (67-100%)] [N/A:N/A] Granulation Quality: [3:Red, Pink] [N/A:N/A] Necrotic Amount: [3:Small (1-33%)] [N/A:N/A] Exposed Structures: [3:Fat Layer (Subcutaneous N/A Tissue) Exposed: Yes Fascia: No Tendon: No Muscle: No Joint:  No Bone: No] Epithelialization: [3:Small (1-33%) Compression Therapy] [N/A:N/A N/A] Treatment Notes Electronic Signature(s) Signed: 08/05/2019 5:21:33 PM By: Linton Ham MD Signed: 08/08/2019 5:31:36 PM By: Kela Millin Entered By: Linton Ham on 08/05/2019 11:47:22 -------------------------------------------------------------------------------- Multi-Disciplinary Care Plan Details Patient Name: Date of Service: [name] 08/05/2019 10:15 AM Medical Record [id number] Patient Account Number: [id number] Date of Birth/Sex: Treating RN: Nov 21, 1955 (64 y.o. [name] Danelly Hassinger: [name] Other Clinician: Referring Celedonio Sortino: Treating Kyle Luppino/Extender:Robson, Velva Harman, Essie Christine in Treatment: [phone] Active Inactive Abuse / Safety / Falls / Self Care Management Nursing Diagnoses: History of Falls Potential for falls Goals: Patient will not experience any injury related to falls Date Initiated: 08/05/2019 Target Resolution Date: 09/02/2019 Goal Status: Active Patient/caregiver will verbalize/demonstrate measures taken to prevent injury and/or falls Date Initiated: 08/05/2019 Target Resolution Date: 09/02/2019 Goal Status: Active Interventions: Assess fall risk on admission and as needed Assess impairment of mobility on admission and as needed per policy Notes: Venous Leg Ulcer Nursing Diagnoses: Actual venous Insuffiency (use after diagnosis is confirmed) Goals: Patient will maintain optimal edema control Date Initiated: 09/04/2017 Target Resolution Date: 08/12/2019 Goal Status: Active Patient/caregiver will verbalize understanding of disease process and disease management Date Initiated: 08/03/2014 Date Inactivated: 08/28/2017 Target Resolution Date: 08/29/2017 Goal Status: Met Interventions: Compression as ordered Treatment Activities: Therapeutic compression applied : 09/21/2015 Notes: Wound/Skin Impairment Nursing  Diagnoses: Impaired tissue integrity Knowledge deficit related to ulceration/compromised skin integrity Goals: Patient will have a decrease in wound volume by X% from date: (specify in notes) Date Initiated: 04/05/2015 Date Inactivated: 02/12/2017 Target Resolution Date: 09/01/2016 Unmet Reason: chronic, Goal Status: Unmet unable to tolerate stronger compression Patient/caregiver will verbalize understanding of skin care regimen Date Initiated: 04/05/2015 Target Resolution Date: 08/12/2019 Goal Status: Active Interventions: Assess patient/caregiver ability to obtain necessary supplies Assess patient/caregiver ability to perform ulcer/skin care regimen upon admission and as needed Assess ulceration(s) every visit Provide education on ulcer and skin care Notes: Electronic Signature(s) Signed: 08/05/2019 5:33:53 PM By: Baruch Gouty RN, BSN Entered By: Baruch Gouty on 08/05/2019 11:33:26 -------------------------------------------------------------------------------- Pain Assessment Details Patient Name: Date of Service: [name] 08/05/2019 10:15 AM Medical Record [id number] Patient Account Number: [id number] Date of Birth/Sex: Treating RN: [birth year] (64 y.o. [name] Primary Care Khristy Kalan: [name] Other Clinician: Referring Keyaira Clapham: Treating Ihsan Nomura/Extender:Robson, Tawny Asal in Treatment: [phone] Active Problems Location of Pain Severity and Description of Pain Patient Has Paino Yes Site Locations Rate the pain. Current Pain Level: 5 Pain Management and Medication Current Pain Management: Electronic Signature(s) Signed: 08/08/2019 5:31:36 PM By: Kela Millin Signed: 09/14/2019 9:21:08 AM By: [name] Entered By: [name] on 08/05/2019 10:51:04 -------------------------------------------------------------------------------- Patient/Caregiver Education Details Patient Name: Date of Service: [name] 3/5/2021andnbsp10:15 AM Medical Record [id number] Patient Account Number: [id number] Date of Birth/Gender:  Treating RN: [birth year] (64 y.o. [name] Physician: [name] Other Clinician: Referring Physician: Treating Physician/Extender:Robson, Tawny Asal in Treatment: [phone] Education Assessment Education Provided To: Patient Education Topics Provided Venous: Methods: Explain/Verbal Responses: Reinforcements needed, State content correctly Wound/Skin Impairment: Methods: Explain/Verbal Responses: Reinforcements needed, State content correctly Electronic Signature(s) Signed: 08/05/2019 5:33:53 PM By: Baruch Gouty RN, BSN Entered By: Baruch Gouty on 08/05/2019 11:29:03 -------------------------------------------------------------------------------- Wound Assessment Details Patient Name: Date of Service: [name] 08/05/2019 10:15 AM Medical Record [id number] Patient Account Number: [id number] Date of Birth/Sex: Treating RN: April 24, 1956 (64 y.o. [name] Primary Care Aimar Borghi: [name] Other Clinician: Referring Lillyrose Reitan: Treating Donatella Walski/Extender:Robson, Tawny Asal in Treatment: 347 Wound Status Wound Number: 3 Primary Venous Leg Ulcer Etiology: Wound Location: Left, Medial Malleolus Wound Open Wounding Event: Gradually Appeared Status: Date Acquired: 11/15/2012 Comorbid Congestive Heart Failure, Peripheral Vascular Weeks Of Treatment: 347 History: Disease, Congestive Heart Failure, End Stage Clustered Wound: No Renal Disease, Tobacco Use, Chronic Obstructive Pulmonary Disease (COPD), Type 1 Diabetes Photos Wound Measurements Length: (cm) 1 % Reductio Width: (cm) 0.9 % Reduc Depth: (cm) 0.1 Epithel Area: (cm) 0.707 Tunnel Volume: (cm) 0.071 Underm Wound Description Classification: Full Thickness Without Exposed Support Foul Od Structures Slough/ Wound Flat  and Intact Margin: Exudate Small Amount: Exudate Serosanguineous Type: Exudate red, brown Color: Wound Bed Granulation Amount: Large (67-100%) Granulation Quality: Red, Pink Fascia Necrotic Amount: Small (1-33%) Fat Lay Necrotic Quality: Adherent Slough Tendon Muscle Joint E Bone Ex or After Cleansing: No Fibrino No Exposed Structure Exposed: No er (Subcutaneous Tissue) Exposed: Yes Exposed: No Exposed: No xposed: No posed: No n in Area: 77.5% tion in Volume: 88.7% ialization: Small (1-33%) ing: No ining: No Electronic Signature(s) Signed: 08/05/2019 4:01:29 PM By: Mikeal Hawthorne EMT/HBOT Signed: 08/08/2019 5:31:36 PM By: Kela Millin Entered By: Mikeal Hawthorne on 08/05/2019 15:47:59 -------------------------------------------------------------------------------- Vitals Details Patient Name: Date of Service: [name] 08/05/2019 10:15 AM Medical Record [id number] Patient Account Number: [id number] Date of Birth/Sex: Treating RN: 1955-11-21 (64 y.o. [name] Primary Care Cyndra Feinberg: [name] Other Clinician: Referring Azalya Galyon: Treating Clydette Privitera/Extender:Robson, Tawny Asal in Treatment: 347 Vital Signs Time Taken: 10:50 Temperature (F): 98.4 Height (in): 68 Pulse (bpm): 61 Weight (lbs): 127 Respiratory Rate (breaths/min): 18 Body Mass Index (BMI): 19.3 Blood Pressure (mmHg): 117/50 Reference Range: 80 - 120 mg / dl Electronic Signature(s) Signed: 09/14/2019 9:21:08 AM By: [name] Entered By: [name] on 08/05/2019 10:50:53

## 2019-09-14 NOTE — Progress Notes (Signed)
LISSANDRA, KEIL ([id number]) Visit Report for 06/17/2019 Arrival Information Details Patient Name: Date of Service: [name], [name] 06/17/2019 10:00 AM Medical Record [id number] Patient Account Number: [id number] Date of Birth/Sex: Treating RN: 05/29/1956 (64 y.o. F) Primary Care Kymiah Araiza: [name] Other Clinician: Referring Mateo Overbeck: Treating Geneieve Duell/Extender:Robson, Tawny Asal in Treatment: 340 Visit Information History Since Last Visit Added or deleted any medications: No Patient Arrived: [name] Any new allergies or adverse reactions: No Arrival Time: 10:42 Had a fall or experienced change in No Accompanied By: self activities of daily living that may affect Transfer Assistance: Manual risk of falls: Patient Identification Verified: Yes Signs or symptoms of abuse/neglect since last No Secondary Verification Process Completed: Yes visito Patient Requires Transmission-Based No Hospitalized since last visit: No Precautions: Implantable device outside of the clinic excluding No Patient Has Alerts: No cellular tissue based products placed in the center since last visit: Has Dressing in Place as Prescribed: Yes Pain Present Now: Yes Electronic Signature(s) Signed: 09/14/2019 9:24:15 AM By: [name] Entered By: [name] on 06/17/2019 10:43:11 -------------------------------------------------------------------------------- Compression Therapy Details Patient Name: Date of Service: [name], [name] 06/17/2019 10:00 AM Medical Record [id number] Patient Account Number: [id number] Date of Birth/Sex: Treating RN: [birth year] (64 y.o. [name] Margan Elias: [name] Other Clinician: Referring Kameron Glazebrook: Treating Hammond Obeirne/Extender:Robson, Tawny Asal in Treatment: 340 Compression Therapy Performed for Wound Wound #3 Left,Medial Malleolus Assessment: Performed By: Clinician Levan Hurst,  RN Compression Type: Rolena Infante Post Procedure Diagnosis Same as Pre-procedure Electronic Signature(s) Signed: 06/17/2019 6:26:16 PM By: Baruch Gouty RN, BSN Entered By: Baruch Gouty on 06/17/2019 11:30:41 -------------------------------------------------------------------------------- Encounter Discharge Information Details Patient Name: Date of Service: [name] 06/17/2019 10:00 AM Medical Record [id number] Patient Account Number: [id number] Date of Birth/Sex: Treating RN: Jun 03, 1955 (64 y.o. [name] Primary Care Jaena Brocato: [name] Other Clinician: Referring Josehua Hammar: Treating Milka Windholz/Extender:Robson, Tawny Asal in Treatment: [phone] Encounter Discharge Information Items Post Procedure Vitals Discharge Condition: Stable Temperature (F): 98.4 Ambulatory Status: Cane Pulse (bpm): 63 Discharge Destination: Home Respiratory Rate (breaths/min): 18 Transportation: Private Auto Blood Pressure (mmHg): 117/38 Accompanied By: self Schedule Follow-up Appointment: Yes Clinical Summary of Care: Patient Declined Electronic Signature(s) Signed: 06/17/2019 5:57:08 PM By: Kela Millin Entered By: Kela Millin on 06/17/2019 12:16:43 -------------------------------------------------------------------------------- Lower Extremity Assessment Details Patient Name: Date of Service: [name] 06/17/2019 10:00 AM Medical Record [id number] Patient Account Number: [id number] Date of Birth/Sex: Treating RN: 02/17/1956 (64 y.o. [name] Debra Colon: [name] Other Clinician: Referring Johndaniel Catlin: Treating Rollen Selders/Extender:Robson, Velva Harman, Essie Christine in Treatment: 340 Edema Assessment Assessed: [Left: No] [Right: No] Edema: [Left: N] [Right: o] Calf Left: Right: Point of Measurement: 33 cm From Medial Instep 30.5 cm cm Ankle Left: Right: Point of Measurement: 12 cm From Medial Instep 21.2  cm cm Vascular Assessment Pulses: Dorsalis Pedis Palpable: [Left:Yes] Electronic Signature(s) Signed: 06/17/2019 6:26:16 PM By: Baruch Gouty RN, BSN Entered By: Baruch Gouty on 06/17/2019 10:56:32 -------------------------------------------------------------------------------- Multi Wound Chart Details Patient Name: Date of Service: [name] 06/17/2019 10:00 AM Medical Record [id number] Patient Account Number: [id number] Date of Birth/Sex: Treating RN: 10-17-1955 (64 y.o. F) Primary Care Carlotta Telfair: [name] Other Clinician: Referring Laney Bagshaw: Treating Takeyla Million/Extender:Robson, Velva Harman, Essie Christine in Treatment: 340 Vital Signs Height(in): 68 Pulse(bpm): 63 Weight(lbs): 127 Blood Pressure(mmHg): 117/38 Body Mass Index(BMI): 19 Temperature(F): 98.4 Respiratory 18 Rate(breaths/min): Photos: [3:No Photos] [N/A:N/A] Wound Location: [3:Left Malleolus - Medial] [N/A:N/A] Wounding Event: [3:Gradually Appeared] [N/A:N/A] Primary Etiology: [3:Venous  Leg Ulcer] [N/A:N/A] Comorbid History: [3:Congestive Heart Failure, Peripheral Vascular Disease, Congestive Heart Failure, End Stage Renal Disease, Tobacco Use, Chronic Obstructive Pulmonary Disease (COPD), Type 1 Diabetes] [N/A:N/A] Date Acquired: [3:11/15/2012] [N/A:N/A] Weeks of Treatment: [3:340] [N/A:N/A] Wound Status: [3:Open] [N/A:N/A] Measurements L x W x D 2.7x1.6x0.1 [N/A:N/A] (cm) Area (cm) : [3:3.393] [N/A:N/A N/A] Volume (cm) : [3:0.339] [N/A:N/A N/A] % Reduction in Area: [3:-8.00%] [N/A:N/A N/A] % Reduction in Volume: [3:46.00%] [N/A:N/A N/A] Classification: [3:Full Thickness Without Exposed Support Structures] [N/A:N/A N/A] Exudate Amount: [3:Medium] [N/A:N/A N/A] Exudate Type: [3:Serosanguineous] [N/A:N/A N/A] Exudate Color: [3:red, brown] [N/A:N/A N/A] Wound Margin: [3:Distinct, outline attached N/A] [N/A:N/A] Granulation Amount: [3:Medium (34-66%)] [N/A:N/A N/A] Granulation  Quality: [3:Pink, Pale] [N/A:N/A N/A] Necrotic Amount: [3:Medium (34-66%)] [N/A:N/A N/A] Exposed Structures: [3:Fat Layer (Subcutaneous N/A Tissue) Exposed: Yes Fascia: No Tendon: No Muscle: No Joint: No Bone: No] [N/A:N/A] Epithelialization: [3:Small (1-33%)] [N/A:N/A N/A] Debridement: [3:Debridement - Excisional N/A] [N/A:N/A] Pre-procedure [3:11:25] [N/A:N/A N/A] Verification/Time Out Taken: Pain Control: [3:Other] [N/A:N/A N/A] Tissue Debrided: [3:Subcutaneous, Slough] [N/A:N/A N/A] Level: [3:Skin/Subcutaneous Tissue] [N/A:N/A N/A] Debridement Area (sq cm):4.32 [N/A:N/A N/A] Instrument: [3:Curette] [N/A:N/A N/A] Bleeding: [3:Minimum] [N/A:N/A N/A] Hemostasis Achieved: [3:Pressure] [N/A:N/A N/A] Procedural Pain: [3:7] [N/A:N/A N/A] Post Procedural Pain: [3:4] [N/A:N/A N/A] Debridement Treatment Procedure was tolerated [N/A:N/A N/A] Response: [3:well] Post Debridement [3:2.7x1.6x0.1] [N/A:N/A N/A] Measurements L x W x D (cm) Post Debridement [3:0.339] [N/A:N/A N/A] Volume: (cm) Procedures Performed: Compression Therapy [3:Debridement] [N/A:N/A N/A] Treatment Notes Wound #3 (Left, Medial Malleolus) 1. Cleanse With Wound Cleanser Soap and water 2. Periwound Care Barrier cream Moisturizing lotion TCA Cream 3. Primary Dressing Applied Hydrofera Blue 4. Secondary Dressing Dry Gauze Kerramax/Xtrasorb Notes no kerlix in unna boot. Erenest Rasher classic Electronic Signature(s) Signed: 06/17/2019 6:20:19 PM By: Linton Ham MD Entered By: Linton Ham on 06/17/2019 12:24:07 -------------------------------------------------------------------------------- Multi-Disciplinary Care Plan Details Patient Name: Date of Service: [name] 06/17/2019 10:00 AM Medical Record [id number] Patient Account Number: [id number] Date of Birth/Sex: Treating RN: 23-Oct-1955 (64 y.o. [name] Raijon Lindfors: [name] Other Clinician: Referring  Lilton Pare: Treating Chirstopher Iovino/Extender:Robson, Velva Harman, Essie Christine in Treatment: 340 Active Inactive Venous Leg Ulcer Nursing Diagnoses: Actual venous Insuffiency (use after diagnosis is confirmed) Goals: Patient will maintain optimal edema control Date Initiated: 09/04/2017 Target Resolution Date: 07/15/2019 Goal Status: Active Patient/caregiver will verbalize understanding of disease process and disease management Date Initiated: 08/03/2014 Date Inactivated: 08/28/2017 Target Resolution Date: 08/29/2017 Goal Status: Met Interventions: Compression as ordered Treatment Activities: Therapeutic compression applied : 09/21/2015 Notes: Wound/Skin Impairment Nursing Diagnoses: Impaired tissue integrity Knowledge deficit related to ulceration/compromised skin integrity Goals: Patient will have a decrease in wound volume by X% from date: (specify in notes) Date Initiated: 04/05/2015 Date Inactivated: 02/12/2017 Target Resolution Date: 09/01/2016 Unmet Reason: chronic, Goal Status: Unmet unable to tolerate stronger compression Patient/caregiver will verbalize understanding of skin care regimen Date Initiated: 04/05/2015 Target Resolution Date: 07/15/2019 Goal Status: Active Interventions: Assess patient/caregiver ability to obtain necessary supplies Assess patient/caregiver ability to perform ulcer/skin care regimen upon admission and as needed Assess ulceration(s) every visit Provide education on ulcer and skin care Notes: Electronic Signature(s) Signed: 06/17/2019 6:26:16 PM By: Baruch Gouty RN, BSN Entered By: Baruch Gouty on 06/17/2019 10:59:30 -------------------------------------------------------------------------------- Pain Assessment Details Patient Name: Date of Service: [name] 06/17/2019 10:00 AM Medical Record [id number] Patient Account Number: [id number] Date of Birth/Sex: Treating RN: 11/01/1955 (64 y.o. F) Primary Care Tavaughn Silguero: [name] Other Clinician: Referring Danuta Huseman: Treating Nedim Oki/Extender:Robson, Velva Harman, Essie Christine in Treatment: [phone] Active  Problems Location of Pain Severity and Description of Pain Patient Has Paino Yes Site Locations Rate the pain. Current Pain Level: 5 Pain Management and Medication Current Pain Management: Electronic Signature(s) Signed: 09/14/2019 9:24:15 AM By: [name] Entered By: [name] on 06/17/2019 10:43:42 -------------------------------------------------------------------------------- Patient/Caregiver Education Details Patient Name: Date of Service: [name] 1/15/2021andnbsp10:00 AM Medical Record [id number] Patient Account Number: [id number] Date of Birth/Gender: Treating RN: 1955/11/01 (63 y.o. [name] Physician: [name] Other Clinician: Referring Physician: Treating Physician/Extender:Robson, Tawny Asal in Treatment: [phone] Education Assessment Education Provided To: Patient Education Topics Provided Venous: Methods: Explain/Verbal Responses: Reinforcements needed, State content correctly Wound/Skin Impairment: Methods: Explain/Verbal Responses: Reinforcements needed, State content correctly Electronic Signature(s) Signed: 06/17/2019 6:26:16 PM By: Baruch Gouty RN, BSN Entered By: Baruch Gouty on 06/17/2019 10:59:52 -------------------------------------------------------------------------------- Wound Assessment Details Patient Name: Date of Service: [name] 06/17/2019 10:00 AM Medical Record [id number] Patient Account Number: [id number] Date of Birth/Sex: Treating RN: 1956-04-03 (64 y.o. F) Primary Care Laquilla Dault: [name] Other Clinician: Referring Vicent Febles: Treating Lizzett Nobile/Extender:Robson, Velva Harman, Essie Christine in Treatment: 340 Wound Status Wound Number: 3 Primary Venous Leg Ulcer Etiology: Wound Location: Left Malleolus -  Medial Wound Open Wounding Event: Gradually Appeared Status: Date Acquired: 11/15/2012 Comorbid Congestive Heart Failure, Peripheral Vascular Weeks Of Treatment: 340 History: Disease, Congestive Heart Failure, End Stage Clustered Wound: No Renal Disease, Tobacco Use, Chronic Obstructive Pulmonary Disease (COPD), Type 1 Diabetes Photos Wound Measurements Length: (cm) 2.7 % Reduc Width: (cm) 1.6 % Reduc Depth: (cm) 0.1 Epithel Area: (cm) 3.393 Tunnel Volume: (cm) 0.339 Underm Wound Description Full Thickness Without Exposed Support Classification: Structures Wound Distinct, outline attached Margin: Exudate Medium Amount: Exudate Serosanguineous Type: Exudate red, brown Color: Wound Bed Granulation Amount: Medium (34-66%) Granulation Quality: Pink, Pale Necrotic Amount: Medium (34-66%) Necrotic Quality: Adherent Slough Foul Odor After Cleansing: No Slough/Fibrino Yes Exposed Structure Fascia Exposed: No Fat Layer (Subcutaneous Tissue) Exposed: Yes Tendon Exposed: No Muscle Exposed: No Joint Exposed: No Bone Exposed: No tion in Area: -8% tion in Volume: 46% ialization: Small (1-33%) ing: No ining: No Electronic Signature(s) Signed: 06/23/2019 4:35:12 PM By: Mikeal Hawthorne EMT/HBOT Previous Signature: 06/17/2019 6:26:16 PM Version By: Baruch Gouty RN, BSN Entered By: Mikeal Hawthorne on 06/23/2019 16:02:36 -------------------------------------------------------------------------------- Vitals Details Patient Name: Date of Service: [name] 06/17/2019 10:00 AM Medical Record [id number] Patient Account Number: [id number] Date of Birth/Sex: Treating RN: 04-05-1956 (64 y.o. F) Primary Care Darah Simkin: [name] Other Clinician: Referring Lakeya Mulka: Treating Annalucia Laino/Extender:Robson, Velva Harman, Essie Christine in Treatment: 340 Vital Signs Time Taken: 10:43 Temperature (F): 98.4 Height (in): 68 Pulse (bpm): 63 Weight (lbs):  127 Respiratory Rate (breaths/min): 18 Body Mass Index (BMI): 19.3 Blood Pressure (mmHg): 117/38 Reference Range: 80 - 120 mg / dl Electronic Signature(s) Signed: 09/14/2019 9:24:15 AM By: [name] Entered By: [name] on 06/17/2019 10:43:28

## 2019-09-16 ENCOUNTER — Encounter (HOSPITAL_BASED_OUTPATIENT_CLINIC_OR_DEPARTMENT_OTHER): Payer: Medicare Other | Admitting: Internal Medicine

## 2019-09-16 ENCOUNTER — Other Ambulatory Visit: Payer: Self-pay

## 2019-09-16 DIAGNOSIS — L97322 Non-pressure chronic ulcer of left ankle with fat layer exposed: Secondary | ICD-10-CM | POA: Diagnosis not present

## 2019-09-17 NOTE — Progress Notes (Signed)
[name], [name] ([id number]) Visit Report for 09/16/2019 HPI Details Patient Name: Date of Service: [name], [name] 09/16/2019 9:30 AM Medical Record [id number] Patient Account Number: [id number] Date of Birth/Sex: Treating RN: 08/29/1955 (64 y.o. [name] Primary Care Provider: [name] Other Clinician: Referring Provider: Treating Provider/Extender:Brenlynn Fake, Tawny Asal in Treatment: 353 History of Present Illness HPI Description: the remaining wound is over the left medial ankle. Similar wound over the right medial ankle healed largely with use of Apligraf. Most recently we have been using Hydrofera Blue over this wound with considerable improvement. The patient has been extensively worked up in the past for her venous insufficiency and she is not a candidate for antireflux surgery although I have none of the details available currently. 08/24/14; considerable improvement today. About 50% of this wound areas now epithelialized. The base of the wound appears to be healthier granulation.as opposed to last week when she had deteriorated a considerable improvement 08/17/14; unfortunately the wound has regressed somewhat. The areas of epithelialization from the superior aspect are not nearly as healthy as they were last week. The patient thinks her Hydrofera Blue slipped. 09/07/14; unfortunately the area has markedly regressed in the 2 weeks since I've seen this. There is an odor surrounding erythema. The healthy granulation tissue that we had at the base of the wound now is a dusky color. The nurse reports green drainage 09/14/14; the area looks somewhat better than last week. There is less erythema and less drainage. The culture I did did not show any growth. Nevertheless I think it is better to continue the Cipro and doxycycline for a further week. The remaining wound area was debridement. 09/21/14. Wound did not require debridement last week. Still less  erythema and less drainage. She can complete her antibiotics. The areas of epithelialization in the superior aspect of the wound do not look as healthy as they did some weeks ago 10/05/14 continued improvement in the condition of this wound. There is advancing epithelialization. Less aggressive debridement required 10/19/14 continued improvement in the condition and volume of this wound. Less aggressive debridement to the inferior part of this to remove surface slough and fibrinous eschar 11/02/14 no debridement is required. The surface granulation appears healthy although some of her islands of epithelialization seem to have regressed. No evidence of infection 11/16/14; lites surface debridement done of surface eschar. The wound does not look to be unhealthy. No evidence of infection. Unfortunately the patient has had podiatry issues in the right foot and for some reason has redeveloped small surface ulcerations in the medial right ankle. Her original presentation involved wounds in this area 11/23/14 no debridement. The area on the right ankle has enlarged. The left ankle wound appears stable in terms of the surface although there is periwound inflammation. There has been regression in the amount of new skin 11/30/14 no debridement. Both wound areas appear healthy. There was no evidence of infection. The the new area on the right medial ankle has enlarged although that both the surfaces appear to be stable. 12/07/14; Debridement of the right medial ankle wound. No no debridement was done on the left. 12/14/14 no major change in and now bilateral medial ankle wounds. Both of these are very painful but the no overt evidence of infection. She has had previous venous ablation 12/21/14; patient states that her right medial ankle wound is considerably more painful last week than usual. Her left is also somewhat painful. She could not tolerate debridement. The right medial ankle  wound has fibrinous  surface eschar 12/28/14 this is a patient with severe bilateral venous insufficiency ulcers. For a considerable period of time we actually had the one on the right medial ankle healed however this recently opened up again in June. The left medial ankle wound has been a refractory area with some absent flows. We had some success with Hydrofera Blue on this area and it literally closed by 50% however it is recently opened up Foley. Both of these were debridement today of surface eschar. She tolerates this poorly 01/25/15: No change in the status of this. Thick adherent escar. Very poor tolerance of any attempt at debridement. I had healed the right medial malleolus wound for a considerable amount of time and had the left one down to about 50% of the volume although this is totally regressed over the last 48 weeks. Further the right leg has reopened. she is trying to make a appointment with pain and vascular, previous ablations with Dr. Aleda Grana. I do not believe there is an arterial insufficiency issue here 02/01/15 the status of the adherent eschar bilaterally is actually improved. No debridement was done. She did not manage to get vascular studies done 02/08/15 continued debridement of the area was done today. The slough is less adherent and comes off with less pressure. There is no surrounding infection peripheral pulses are intact 02/15/15 selective debridement with a disposable curette. Again the slough is less adherent and comes off with less difficulty. No surrounding infection peripheral pulses are intact. 02/22/15 selective debridement of the right medial ankle wound. Slough comes off with less difficulty. No obvious surrounding infection peripheral pulses are intact I did not debridement the one on the left. Both of these are stable to improved 03/01/15 selective debridement of both wound areas using a curette to. Adherent slough cup soft with less difficulty. No obvious surrounding  infection. The patient tells me that 2 days ago she noted a rash above the right leg wrap. She did not have this on her lower legs when she change this over she arrives with widespread left greater than right almost folliculitis-looking rash which is extremely pruritic. I don't see anything to culture here. There is no rash on the rest of her body. She feels well systemically. 03/08/15; selective debridement of both wounds using a curette. Base of this does not look unhealthy. She had limegreen drainage coming out of the left leg wound and describes a lot of drainage. The rash on her left leg looks improved to. No cultures were done. 03/22/15; patient was not here last week. Basal wounds does not look healthy and there is no surrounding erythema. No drainage. There is still a rash on the left leg that almost looks vasculitic however it is clearly limited to the top of where the wrap would be. 04/05/15; on the right required a surgical debridement of surface eschar and necrotic subcutaneous tissue. I did not debridement the area on the left. These continue to be large open wounds that are not changing that much. We were successful at one point in healing the area on the right, and at the same time the area on the left was roughly half the size of current measurements. I think a lot of the deterioration has to do with the prolonged time the patient is on her feet at work 04/19/15 I attempted-like surface debridement bilaterally she does not tolerate this. She tells me that she was in allergic care yesterday with extreme pain over her left lateral  malleolus/ankle and was told that she has an "sprain" 05/03/15; large bilateral venous insufficiency wounds over the medial malleolus/medial aspect of her ankles. She complains of copious amounts of drainage and his usual large amounts of pain. There is some increasing erythema around the wound on the right extending into the medial aspect of her foot to.  historically she came in with these wounds the right one healed and the left one came down to roughly half its current size however the right one is reopened and the left is expanded. 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 05/10/15 large bilateral venous insufficiency wounds. There is less adherence surface left however the surface culture that I did last week grew pseudomonas therefore bilateral selective debridement score necessary. There is surrounding erythema. The patient describes severe bilateral drainage and a lot of pain in the left ankle. Apparently her podiatrist was were ready to do a cortisone shot 05/17/15; the patient complains of pain and again copious amounts of drainage. 05/24/15; we used Iodo flex last week. Patient notes considerable improvement in wound drainage. Only needed to change this once. 05/31/15; we continued Iodoflex; the base of these large wounds bilaterally is not too bad but there is probably likely a significant bioburden here. I would like to debridement just doesn't tolerate it. 06/06/14 I would like to continue the Iodoflex although she still hasn't managed to obtain supplies. She has bilateral medial malleoli or large wounds which are mostly superficial. Both of them are covered circumferentially with some nonviable fibrinous slough although she tolerates debridement very poorly. She apparently has an appointment for an ablation on the right leg by interventional radiology. 06/14/15; the patient arrives with the wounds and static condition. We attempted a debridement although she does not do well with this secondary to pain. I 07/05/15; wounds are not much smaller however there appears to be a cleaner granulating base. The left has tight fibrinous slough greater than the right. Debridement is tolerated poorly due to pain. Iodoflex is done more for these wounds in any of the multitude of different dressings I  have tried on the left 1 and then subsequently the right. 07/12/15; no change in the condition of this wound. I am able to do an aggressive debridement on the right but not the left. She simply cannot tolerate it. We have been using Iodoflex which helps somewhat. It is worthwhile remembering that at one point we healed the right medial ankle wound and the left was about 25% of the current circumference. We have suggested returning to vascular surgery for review of possible further ablations for one reason or another she has not been able to do this. 07/26/15 no major change in the condition of either wound on her medial ankle. I did not attempt to debridement of these. She has been aggressively scrubbing these while she is in the shower at home. She has her supply of Iodoflex which seems to have done more for these wounds then anything I have put on recently. 08/09/15 wound area appears larger although not verified by measurements. Using Iodoflex 09/05/2015 -- she was here for avisit today but had significant problems with the wound and I was asked to see her for a physician opinion. I have summarize that this lady has had surgery on her left lower extremity about 10 years ago where the possible veins stripping was done. She has had an opinion from interventional radiology  around November 2016 where no further sclerotherapy was ordered. The patient works 12 hours a day and stands on a concrete floor with work boots and is unable to get the proper compression she requires and cannot elevate her limbs appropriately at any given time. She has recently grown Pseudomonas from her wound culture but has not started her ciprofloxacin which was called in for her. 09/13/15 this continues to be a difficult situation for this patient. At one point I had this wound down to a 1.5 x 1.5" wound on her left leg. This is deteriorated and the right leg has reopened. She now has substantial wounds on her medial calcaneus,  malleoli and into her lower leg. One on the left has surface eschar but these are far too painful for me to debridement here. She has a vascular surgery appointment next week to see if anything can be done to help here. I think she has had previous ablations several years ago at [facility]. She has no major edema. She tells me that she did not get product last time [facility] and went for several days without it. She continues to work in work boots 12 hours a day. She cannot get compression/4-layer under her work boots. 09/20/15 no major change. Periwound edema control was not very good. Her point with pain and vascular is next Wednesday the 25th 09/28/15; the patient is seen vascular surgery and is apparently scheduled for repeat duplex ultrasounds of her bilateral lower legs next week. 10/05/15; the patient was seen by Dr. Doren Custard of vascular surgery. He feels that she should have arterial insufficiency excluded as cause/contributed to her nonhealing stage she is therefore booked for an arteriogram. She has apparently monophasic signals in the dorsalis pedis pulses. She also of course has known severe chronic venous insufficiency with previous procedures as noted previously. I had another long discussion with the patient today about her continuing to work 12 hour shifts. I've written her out for 2 months area had concerns about this as her work location is currently undergoing significant turmoil and this may lead to her termination. She is aware of this however I agree with her that she simply cannot continue to stand for 12 hours multiple days a week with the substantial wound areas she has. 10/19/15; the Dr. Doren Custard appointment was largely for an arteriogram which was normal. She does not have an arterial issue. He didn't make a comment about her chronic venous insufficiency for which she has had previous ablations. Presumably it was not felt that anything additional could be done. The patient is  now out of work as I prescribed 2 weeks ago. Her wounds look somewhat less aggravated presumably because of this. I felt I would give debridement another try today 10/25/15; no major change in this patient's wounds. We are struggling to get her product that she can afford into her own home through her insurance. 11/01/15; no major change in the patient's wounds. I have been using silver alginate as the most affordable product. I spoke to Dr. Marla Roe last week with her requested take her to the OR for surgical debridement and placement of ACEL. Dr. Marla Roe told me that she would be willing to do this however [facility] will not cover this, fortunately the patient has [place] healthcare of some variant 11/08/15; no major change in the patient's wounds. She has been completely nonviable surface that this but is in too much pain with any attempted debridement are clinic. I have  arranged for her to see Dr. Marla Roe ham of plastic surgery and this appointment is on Monday. I am hopeful that they will take her to the OR for debridement, possible ACEL ultimately possible skin graft 11/22/15 no major change in the patient's wounds over her bilateral medial calcaneus medial malleolus into the lower legs. Surface on these does not look too bad however on the left there is surrounding erythema and tenderness. This may be cellulitis or could him sleepy tinea. 11/29/15; no major changes in the patient's wounds over her bilateral medial malleolus. There is no infection here and I don't think any additional antibiotics are necessary. There is now plan to move forward. She sees Dr. Marla Roe in a week's time for preparation for operative debridement and ACEL placement I believe on 7/12. She then has a follow-up appointment with Dr. Marla Roe on 7/21 12/28/15; the patient returns today having been taken to the [place] by Dr. Marla Roe 12/12/15 she underwent debridement, intraoperative cultures [which were  negative]. She had placement of a wound VAC. Parent really ACEL was not available to be placed. The wound VAC foam apparently adhered to the wound since then she's been using silver alginate, Xeroform under Ace wraps. She still says there is a lot of drainage and a lot of pain 01/31/16; this is a patient I see monthly. I had referred her to Dr. Marla Roe him of plastic surgery for large wounds on her bilateral medial ankles. She has been to the OR twice once in early July and once in early August. She tells me over the last 3 weeks she has been using the wound VAC with ACEL underneath it. On the right we've simply been using silver alginate. Under Kerlix Coban wraps. 02/28/16; this is a patient I'm currently seeing monthly. She is gone on to have a skin graft over her large venous insufficiency ulcer on the left medial ankle. This was done by Dr. Marla Roe him. The patient is a bit perturbed about why she didn't have one on her right medial ankle wound. She has been using silver alginate to this. 03/06/16; I received a phone call from her plastic surgery Dr. Marla Roe. She expressed some concern about the viability of the skin graft she did on the left medial ankle wound. Asked me to place Endoform on this. She told me she is not planning to do a subsequent skin graft on the right as the left one did not take very well. I had placed Hydrofera Blue on the right 03/13/16; continue to have a reasonably healthy wound on the right medial ankle. Down to 3 mm in terms of size. There is epithelialization here. The area on the left medial ankle is her skin graft site. I suppose the last week this looks somewhat better. She has an open area inferiorly however in the center there appears to be some viable tissue. There is a lot of surface callus and eschar that will eventually need to come off however none of this looked to be infected. Patient states that the is able to keep the dressing on for several days  which is an improvement. 03/20/16 no major change in the circumference of either wound however on the left side the patient was at Dr. Eusebio Friendly office and they did a debridement of left wound. 50% of the wound seems to be epithelialized. I been using Endoform on the left Hydrofera Blue in the right 03/27/16; she arrives today with her wound is not looking as healthy as they did  last week. The area on the right clearly has an adherent surface to this a very similar surface on the left. Unfortunately for this patient this is all too familiar problem. Clearly the Endoform is not working and will need to change that today that has some potential to help this surface. She does not tolerate debridement in this clinic very well. She is changing the dressing wants 04/03/16; patient arrives with the wounds looking somewhat better especially on the right. Dr. Migdalia Dk change the dressing to silver alginate when she saw her on Monday and also sold her some compression socks. The usefulness of the latter is really not clear and woman with severely draining wounds. 04/10/16; the patient is doing a bit of an experiment wearing the compression stockings that Dr. Migdalia Dk provided her to her left leg and the out of legs based dressings that we provided to the right. 05/01/16; the patient is continuing to wear compression stockings Dr. Migdalia Dk provided her on the left that are apparently silver impregnated. She has been using Iodoflex to the right leg wound. Still a moderate amount of drainage, when she leaves here the wraps only last for 4 days. She has to change the stocking on the left leg every night 05/15/16; she is now using compression stockings bilaterally provided by Dr. Marla Roe. She is wearing a nonadherent layer over the wounds so really I don't think there is anything specific being done to this now. She has some reduction on the left wound. The right is stable. I think all healing here is being done  without a specific dressing 06/09/16; patient arrives here today with not much change in the wound certainly in diameter to large circular wounds over the medial aspect of her ankle bilaterally. Under the light of these services are certainly not viable for healing. There is no evidence of surrounding infection. She is wearing compression stockings with some sort of silver impregnation as prescribed by Dr. Marla Roe. She has a follow-up with her tomorrow. 06/30/16; no major change in the size or condition of her wounds. These are still probably covered with a nonviable surface. She is using only her purchase stockings. She did see Dr. Marla Roe who seemed to want to apply Dakin's solution to this I'm not extreme short what value this would be. I would suggest Iodoflex which she still has at home. 07/28/16; I follow Mrs. Manocchio episodically along with Dr. Marla Roe. 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 She has been applying a topical collagen ointment to both wounds with Adaptic. I don't think Dr. Marla Roe is planning to take her back to the OR. 08/19/16; I follow Mrs. Jeneen Rinks on a monthly basis along with Dr. Marla Roe of plastic surgery. 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 I been following her for a number of years. At one point I was able to get the right medial malleolus wound to heal and had the left medial malleolus down to about half its current size however and I had to send her to plastic surgery for an operative debridement. Since then things have been stable to slightly improve the area on the right is slightly better one in the left about the same although there is much less adherent surface than I'm used to with this patient. She is using some form of liquid collagen gel that Dr. Marla Roe provided a Kerlix cover with the patient's own  pressure stockings. She tells me that she has extreme pain in both ankles and along the lateral aspect of both feet. She has been unable to work for some period of time. She is telling me she is retiring at the beginning of April. She sees Dr. Doran Durand of orthopedics next week 09/22/16; patient has not seen Dr. Marla Roe since the last time she is here. I'm not really sure what she is using to the wounds other than bits and pieces of think she had left over including most recently Hydrofera Blue. She is using juxtalite stockings. She is having difficulty with her husband's recent illness "stroke". She is having to transport him to various doctors appointments. Dr. Marla Roe left her the option of a repeat debridement with ACEL however she has not been able to get the time to follow-up on this. She continues to have a fair amount of drainage out of these wounds with certainly precludes leaving dressings on all week 10/13/16; patient has not seen Dr. Marla Roe since she was last in our clinic. I'm not really sure what she is doing with the wounds, we did try to get her [facility] and I think she is actually using this most of the time. Because of drainage she states she has to change this every second day although this is an improvement from what she used to do. She went to see Dr. Doran Durand who did not think she had a muscular issue with regards to her feet, he referred her to a neurologist and I think the appointment is sometime in June. I changed her back to Iodoflex which she has used in the past but not recently. 11/03/16; the patient has been using Iodoflex although she ran out of this. Still claims that there is a lot of drainage although the wound does not look like this. No surrounding erythema. She has not been back to see Dr. Marla Roe 11/24/16; the patient has been using Iodoflex again but she ran out of it 2 or 3 days ago. There is no major change in the condition of either one of these  wounds in fact they are larger and covered in a thick adherent surface slough/nonviable tissue especially on the left. She does not tolerate mechanical debridement in our clinic. Going back to see Dr. Marla Roe of plastic surgery for an operative debridement would seem reasonable. 12/15/16; the patient has not been back to see Dr. Marla Roe. She is been dealing with a series of illnesses and her husband which of monopolized her time. She is been using Sorbact which we largely supplied. She states the drainage is bad enough that it maximum she can go 2-3 days without changing the dressing 01/12/2017 -- the patient has not been back for about 4 weeks and has not seen Dr. Marla Roe not does she have any appointment pending. 01/23/17; patient has not seen Dr. Marla Roe even though I suggested this previously. She is using Santyl that was suggested last week by Dr. Con Memos this Cost her $16 through her insurance which is indeed surprising 02/12/17; continuing Santyl and the patient is changing this daily. A lot of drainage. She has not been back to see plastic surgery she is using an Ace wrap. Our intake nurse suggested wrap around stockings which would make a good reasonable alternative 02/26/17; patient is been using Santyl and changing this daily due to drainage. She has not been to see plastic surgery she uses in April Ace wrap to control the edema. She did obtain extremitease stockings  but stated that the edema in her leg was to big for these 03/20/17; patient is using Santyl and Anasept. Surfaces looked better today the area on the right is actually measuring a little smaller. She has states she has a lot of pain in her feet and ankles and is asking for a consult to pain control which I'll try to help her with through our case manager. 04/10/17; the patient arrives with better-looking wound surfaces and is slightly smaller wound on the left she is using a combination of Santyl and Anasept. She has  an appointment or at least as started in the pain control center associated with [facility] 05/14/17; this is a patient who I followed for a prolonged period of time. 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 At one point I had this down to a much smaller wound on the left however these reopened and we've never been able to get these to heal. She has been using Santyl and Anasept gel although 2 weeks ago she ran out of the Anasept gel. She has a stable appearance of the wound. She is going to the wound care clinic at [facility]. They wanted do a nerve block/spinal block although she tells me she is reluctant to go forward with that. 05/21/17; this is a patient I have followed for many years. 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 Chronic pain and deformity in her ankles as well. She is been to see plastic surgery as well as orthopedics. Using PolyMem AG most recently/Kerramax/ABDs and 2 layer compression. She has managed to keep this on and she is coming in for a nurse check to change the dressing on Tuesdays, we see her on Fridays 06/05/17; really quite a good looking surface and the area especially on the right medial has contracted in terms of dimensions. Well granulated healthy-looking tissue on both sides. Even with an open curet there is nothing that even feels abnormal here. This is as good as I've seen this in quite some time. We have been using PolyMem AG and bringing her in for a nurse check 06/12/17; really quite good surface on both of these wounds. The right medial has contracted a bit left is not. We've been using PolyMem and AG and she is coming in for a nurse visit 06/19/17; we have been using PolyMem AG and bringing her in for a nurse check. Dimensions of her wounds are not better but the surfaces looked better bilaterally. She complained of bleeding last night and the left wound and increasing pain bilaterally. She  states her wound pain is more neuropathic than just the wounds. There was some suggestion that this was radicular from her pain management doctor in talking to her it is really difficult to sort this out. 06/26/17; using PolyMem and AG and bringing her in for a nurse check as All of this and reasonably stable condition. Certainly not improved. The dimensions on the lateral part of the right leg look better but not really measuring better. The medial aspect on the left is about the same. 07/03/16; we have been using PolyMen AG and bringing her in for a nurse check to change the dressings as the wounds have drainage which precludes once weekly changing. We are using all secondary absorptive dressings.our intake nurse is brought up the idea of using a wound VAC/snap VAC on the wound to help with the drainage to see if this would result in some contraction. This is  not a bad idea. The area on the right medial is actually looking smaller. Both wounds have a reasonable-looking surface. There is no evidence of cellulitis. The edema is well controlled 07/10/17; the patient was denied for a snap VAC by her insurance. The major issue with these wounds continues to be drainage. We are using wicked PolyMem AG and she is coming in for a nurse visit to change this. The wounds are stable to slightly improved. The surface looks vibrant and the area on the right certainly has shrunk in size but very slowly 07/17/17; the patient still has large wounds on her bilateral medial malleoli. Surface of both of these wounds looks better. The dimensions seem to come and go but no consistent improvement. There is no epithelialization. We do not have options for advanced treatment products due to insurance issues. They did not approve of the wound VAC to help control the drainage. More recently we've been using PolyMem and AG wicked to allow drainage through. We have been bringing her in for a nurse visit to change this. We do not  have a lot of options for wound care products and the home again due to insurance issues 07/24/17; the patient's wound actually looks somewhat better today. No drainage measurements are smaller still healthy-looking surface. We used silver collagen under PolyMen started last week. We have been bringing her in for a dressing change 07/31/17; patient's wound surface continued to look better and I think there is visible change in the dimensions of the wound on the right. Rims of epithelialization. We have been using silver collagen under PolyMen and bringing her in for a dressing change. There appears to be less drainage although she is still in need of the dressing change 08/07/17. Patient's wound surface continues to look better on both sides and the area on the right is definitely smaller. We have been using silver collagen and PolyMen. She feels that the drainage has been it has been better. I asked her about her vascular status. She went to see Dr. Aleda Grana at [facility] and had some form of ablation. I don't have much detail on this. I haven't my notes from 2016 that she was not a candidate for any further ablation but I don't have any more information on this. We had referred her to vein and vascular I don't think she ever went. He does not have a history of PAD although I don't have any information on this either. We don't even have ABIs in our record 08/14/17; we've been using silver collagen and PolyMen cover. And putting the patient and compression. She we are bringing her in as a nurse visit to change this because ofarge amount of drainage. We didn't the ABIs in clinic today since they had been done in many moons 1.2 bilaterally. She has been to see vein and vascular however this was at [facility] and she had ablation although I really don't have any information on this all seemed biking get a report. She is also been operatively debrided by plastic surgery and had a cell placed  probably 8-12 months ago. This didn't have a major effect. We've been making some gains with current dressings 08/19/17-She is here in follow-up evaluation for bilateral medial malleoli ulcers. She continues to tolerate debridement very poorly. We will continue with recently changed topical treatment; if no significant improvement may consider switching to Iodosorb/Iodoflex. 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 She has been using silver  collagen and PolyMem. I believe she has been used and tried on Iodoflex before. 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 These have been very refractory over time. We have been using silver collagen and PolyMen. She is coming in once a week for a doctor's and once a week for nurse visits. We are actually making some progress 09/18/17; the patient's wounds are smaller especially on the right medial. She arrives today to upset to consider even washing these off with Anasept which I think is been part of the reason this is been closing. We've been using collagen covered in PolyMen otherwise. It is noted that she has a small area of folliculitis on the right medial calf that. As we are wrapping her legs I'll give her a short course of doxycycline to make sure this doesn't amount to anything. She is a long list of complaints today including imbalance, shortness of breath on exertion, inversion of her left ankle. With regards to the latter complaints she is been to see orthopedics and they offered her a tendon release surgery I believe but wanted her wounds to be closed first. I have recommended she go see her primary physician with regards to everything else. 09/25/17; patient's wounds are about the same size. We have made  some progress bilaterally although not in recent weeks. She will not allow me T wash these wounds with Anasept even if she is doing her cell. Wheeze we've been using collagen covered in PolyMen. Last week she had a small area of folliculitis this is now opened into a small wound. She completed 5 days of trimethoprim sulfamethoxazole 10/02/17; unfortunately the area on her left medial ankle is worse with a larger wound area towards the Achilles. The patient complains of a lot of pain. She will not allow debridement although visually I don't think there is anything to debridement in any case. We have been using silver collagen and PolyMen for several months now. Initially we are making some progress although I'm not really seeing that today. We will move back to [facility]. His admittedly this is a bit of a repeat however I'm hoping that his situation is different now. The patient tells me she had her leg on the left give out on her yesterday this is process some pain. 10/09/17; the patient is seen twice a week largely because of drainage issues coming out of the chronic medial bimalleolar wounds that are chronic. Last week the dimensions of the one on the left looks a little larger I changed her to [facility]. She comes in today with a history of terrible pain in the bilateral wound areas. She will not allow debridement. She will not even allow a tissue culture. There is no surrounding erythema no no evidence of cellulitis. We have been putting her Kerlix Coban man. She will not allow more aggressive compression as there was a suggestion to put her in 3 layer wraps. 10/16/17; large wounds on her bilateral medial malleoli. These are chronic. Not much change from last week. The surface looks have healthy but absolutely no epithelialization. A lot of pain little less so of drainage. She will not allow debridement or even washing these off in the vigorous fashion with Anasept. 10/23/17; large wounds  on her bilateral malleoli which are chronic. Some improvement in terms  of size perhaps on the right since last time I saw these. She states that after we increased the 3 layer compression there was some bleeding, when she came in for a nurse visit she did not want 3 layer compression put back on about our nurse managed to convince her. She has known chronic venous visit issues and I'm hoping to get her to tolerate the 3 layer compression. using Hydrofera Blue 10/30/17; absolutely no change in the condition of either wound although we've had some improvement in dimensions on the right.. Attempted to put her in 3 layer compression she didn't tolerated she is back in 2 layer compression. We've been using Hydrofera Blue We looked over her past records. She had venous reflux studies in November 2016. There was no evidence of deep venous reflux on the right. Superficial vein did not show the greater saphenous vein at think this is been previously ablated the small saphenous vein was within normal limits. The left deep venous system showed no DVT the vessels were positive for deep venous reflux in the posterior tibial veins at the ankle. The greater saphenous vein was surgically absent small saphenous vein was within normal limits. She went to vein and vascular at [facility]. I believe she had an ablation on the left greater saphenous vein. I'll update her reflux studies perhaps ever reviewed by vein and vascular. We've made absolutely no progress in these wounds. Will also try to read and TheraSkins through her insurance 11/06/17; W the patient apparently has a 2 week follow-up with vein and vascular I like him to review the whole issue with regards to her previous vascular workup by Dr. Aleda Grana. We've really made no progress on these wounds in many months. She arrives today with less viable looking surface on the left medial ankle wound. This was apparently looking about the same on Tuesday when  she was here for nurse visit. 11/13/17; deep tissue culture I did last time of the left lower leg showed multiple organisms without any predominating. In particular no Staphylococcus or group A strep were isolated. We sent her for venous reflux studies. She's had a previous left greater saphenous vein stripping and I think sclerotherapy of the right greater saphenous vein. She didn't really look at the lesser saphenous vein this both wounds are on the medial aspect. She has reflux in the common femoral vein and popliteal vein and an accessory vein on the right and the common femoral vein and popliteal vein on the left. I'm going to have her go to see vein and vascular just the look over things and see if anything else beside aggressive compression is indicated here. We have not been able to make any progress on these wounds in spite of the fact that the surface of the wounds is never look too bad. 11/20/17; no major change in the condition of the wounds. Patient reports a large amount of drainage. She has a lot of complaints of pain although enlisting her today I wonder if some of this at least his neuropathic rather than secondary to her wounds. She has an appointment with vein and vascular on 12/30/17. The refractory nature of these wounds in my mind at least need vein and vascular to look over the wounds the recent reflux studies we did and her history to see if anything further can be done here. I also note her gait is deteriorated quite a bit. 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 She has  a bilateral Trendelenburg gait. I wonder if this is neuropathic or perhaps multilevel radicular. 11/27/17; her wounds actually looks slightly better. Healthy-looking granulation tissue a scant amount of epithelialization. [place] healthcare will not pay for [employer]. They will play for tri layer Oasis and Dermagraft. This is not a diabetic ulcer. We'll try for the tri layer Oasis. She still  complains of some drainage. She has a vein and vascular appointment on 12/30/17 12/04/17; the wounds visually look quite good. Healthy-looking granulation with some degree of epithelialization. We are still waiting for response to our request for trial to try layer Oasis. Her appointment with vascular to review venous and arterial issues isn't sold the end of July 7/31. Not allow debridement or even vigorous cleansing of the wound surface. 12/18/17; slightly smaller especially on the right. 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 We've been using Hydrofera Blue. We still are looking into triple layer Oasis through her insurance 01/08/18 on evaluation today patient's wound actually appears to be showing signs of good improvement at this point in time. She has been tolerating the dressing changes without complication. Fortunately there does not appear to be any evidence of infection at this point in time. We have been utilizing silver nitrate which does seem to be of benefit for her which is also good news. Overall I'm very happy with how things seem to be both regards appearance as well as measurement. Patient did see Dr. Bridgett Larsson for evaluation on 12/30/17. In his assessment he felt that stripping would not likely add much more than chronic compression to the patient's healing process. His recommendation was to follow-up in three months with Dr. Doren Custard if she hasn't healed in order to consider referral back to you and see vascular where she previously was in a trial and was able to get her wound to heal. I'll be see what she feels she when you staying compression and he reiterated this as well. 01/13/18 on evaluation today patient appears to actually be doing very well in regard to her bilateral medial malleolus ulcers. She seems to have tolerated the chemical cauterization with silver nitrate last week she did have some pain through that evening but fortunately states that I'll  be see since it seems to be doing better she is overall pleased with the progress. 01/21/18; really quite a remarkable improvement since I've last seen these wounds. We started using silver nitrate specially on the islands of hyper granulation which for some reason her around the wound circumference. This is really done quite nicely. Primary dressing Hydrofera Blue under 4 layer compression. She seems to be able to hold out without a nurse rewrap. Follow-up in 1 week 01/28/18; we've continued the hydrofera blue but continued with chemical cauterization to the wound area that we started about a month ago for irregular hyper granulation. She is made almost stunning improvement in the overall wound dimensions. I was not really expecting this degree of improvement in these chronic wounds 02/05/18; we continue with Hydrofera Bluebut of also continued the aggressive chemical cauterization with silver nitrate. We made nice progress with the right greater than left wound. 02/12/18. We continued with Hydrofera Blue after aggressive chemical cauterization with silver nitrate. We appear to be making nice progress with both wound areas 02/19/2018; we continue with [facility] after washing the wounds vigorously with Anasept spray and chemical cauterization with silver nitrate. We are making excellent progress. The area on the right's just about closed 02/26/2018. The area on the left medial  ankle had too much necrotic debris today. I used a #5 curette we are able to get most of the soft. I continued with the silver nitrate to the much smaller wound on the right medial ankle she had a new area on her right lower pretibial area which she says was due to a role in her compression 03/05/2018; both wound areas look healthy. Not much change in dimensions from last week. I continue to use silver nitrate and Hydrofera Blue. The patient saw Dr. Doren Custard of vein and vascular. He felt she had venous stasis ulcers. He felt  based on her previous arteriogram she should have adequate circulation for healing. Also she has deep venous reflux but really no significant correctable superficial venous reflux at this time. He felt we should continue with conservative management including leg elevation and compression 04/02/2018; since we last saw this woman about a month ago she had a fall apparently suffered a pelvic fracture. I did not look up the x-ray. Nevertheless because of pain she literally was bedbound for 2 weeks and had home health coming out to change the dressing. Somewhat predictably this is resulted in considerable improvement in both wound areas. The right is just about closed on the medial malleolus and the left is about half the size. 04/16/2018; both her wounds continue to go down in size. Using Hydrofera Blue. 05/07/18; both her wounds appeared to be improving especially on the right where it is almost closed. We are using Hydrofera Blue 05/14/2018; slightly worse this week with larger wounds. Surface on the left medial not quite as good. We have been using Hydrofera Blue 05/21/18; again the wounds are slightly larger. Left medial malleolus slightly larger with eschar around the circumference. We have been using Hydrofera Blue undergoing a wraps for a prolonged period of time. This got a lot better when she was more recumbent due to a fall and a back injury. I change the primary dressing the silver alginate today. She did not tolerate a 4 layer compression previously although I may need to bring this up with her next time 05/28/2018; area on the left medial malleolus again is slightly larger with more drainage. Area on the right is roughly unchanged. She has a small area of folliculitis on the right medial just on the lower calf. This does not look ominous. 06/03/2018 left medial malleolus slightly smaller in a better looking surface. We used silver nitrate on this last time with silver alginate. The area  on the right appears slightly smaller 1/10; left medial malleolus slightly smaller. Small open area on the right. We used silver nitrate and silver alginate as of 2 weeks ago. We continue with the wound and compression. These got a lot better when she was off her feet 1/17; right medial malleolus wound is smaller. The left may be slightly smaller. Both surfaces look somewhat better. 1/24; both wounds are slightly smaller. Using silver alginate under Unna boots 1/31; both wounds appear smaller in fact the area on the right medial is just about closed. Surface eschar. We have been using silver alginate under Unna boots. The patient is less active now spends let much less time on her feet and I think this is contributed to the general improvement in the wound condition 2/7; both wounds appear smaller. I was hopeful the right medial would be closed however there there is still the same small open area. Slight amount of surface eschar on the left the dimensions are smaller there is eschar but  the wound edges appear to be free. We have been using silver alginate under Unna boot's 2/14; both wounds once again measure smaller. Circumferential eschar on the left medial. We have been using silver alginate under Unna boots with gradual improvement 2/21; the area on the right medial malleolus has healed. The area on the left is smaller. We have been using silver alginate and Unna boots. We can discharge wrapping the right leg she has 20/30 stockings at home she will need to protect the scar tissue in this area 2/28; the area on the right medial malleolus remains closed the patient has a compression stocking. The area on the left is smaller. We have been using silver alginate and Unna boots. 3/6 the area on the right medial ankle remains closed. Good edema control noted she is using her own compression stocking. The area on the left medial ankle is smaller. We have been managing this with silver alginate and  Unna boots which we will continue today. 3/13; the area on the right medial ankle remains closed and I'm declaring it healed today. When necessary the left is about the same still a healthy-looking surface but no major change and wound area. No evidence of infection and using silver alginate under unna and generally making considerable improvement 3/27 the area on the right medial ankle remains closed the area on the left is about the same as last week. Certainly not any worse we have been using silver alginate under an Unna boot 4/3; the area on the right medial ankle remains closed per the patient. We did not look at this wound. The wound on the left medial ankle is about the same surface looks healthy we have been using silver alginate under an Unna boot 4/10; area on the right medial ankle remains closed per the patient. We did not look at this wound. The wound on the left medial ankle is slightly larger. The patient complains that the [facility] caused burning pain all week. She also told us that she was a lot more active this week. Changed her back to silver alginate 4/17; right medial ankle still closed per the patient. Left medial ankle is slightly larger. Using silver alginate. She did not tolerate Hydrofera Blue on this area 4/24; right medial ankle remains closed we have not look at this. The left medial ankle continues to get larger today by about a centimeter. We have been using silver alginate under Unna boots. She complains about 4 layer compression as an alternative. She has been up on her feet working on her garden 5/8; right medial ankle remains closed we did not look at this. The left medial ankle has increased in size about 100%. We have been using silver alginate under Unna boots. She noted increased pain this week and was not surprised that the wound is deteriorated 5/15; no major change in SA however much less erythema ( one week of doxy ocellulitis). 5/22-64 year old  female returns at 1 week to the clinic for left medial ankle wound for which we have been using silver alginate under 3 layer compression She was placed on DOXY at last visit - the wound is wider at this visit. She is in 3 layer compression 5/29; change to [facility] last week. I had given her empiric doxycycline 2 weeks ago for a week. She is in 3 layer compression. She complains of a lot of pain and drainage on presentation today. 6/5; using Hydrofera Blue. I gave her doxycycline recently empirically for  erythema and pain around the wound. Believe her cultures showed enterococcus which not would not have been well covered by doxycycline nevertheless the wound looks better and I don't feel specifically that the enterococcus needs to be covered. She has a new what looks like a wrap injury on her lateral left ankle. 6/12; she is using Hydrofera Blue. She has a new area on the left anterior lower tibial area. This was a wrap injury last week. 6/19; the patient is using Hydrofera Blue. She arrived with marked inflammation and erythema around the wound and tenderness. 12/01/18 on evaluation today patient appears to be doing a little bit better based on what I'm hearing from the standpoint of lassos evaluation to this as far as the overall appearance of the wound is concerned. Then sometime substandard she typically sees Dr. Dellia Nims. Nonetheless overall very pleased with the progress that she's made up to this point. No fevers, chills, nausea, or vomiting noted at this time. 7/10; some improvement in the surface area. Aggressively debrided last week apparently. I went ahead with the debridement today although the patient does not tolerate this very well. We have been using Iodoflex. Still a fair amount of drainage 7/17; slightly smaller. Using Iodoflex. 7/24; no change from last week in terms of surface area. We have been using Iodoflex. Surface looks and continues to look somewhat better 7/31;  surface area slightly smaller better looking surface. We have been using Iodoflex. This is under Unna boot compression 8/7-Patient presents at 1 week with Unna boot and Iodoflex, wound appears better 8/14-Patient presents at 1 week with Iodoflex, we use the Unna boot, wound appears to be stable better.Patient is getting Botox treatment for the inversion of the foot for tendon release, Next week 8/21; we are using Iodoflex. Unna boot. The wound is stable in terms of surface area. Under illumination there is some areas of the wound that appear to be either epithelialized or perhaps this is adherent slough at this point I was not really clear. It did not wipe off and I was reluctant to debride this today. 8/28; we are using Iodoflex in an Unna boot. Seems to be making good improvement. 9/4; using Iodoflex and wound is slightly smaller. 9/18; we are using Iodoflex with topical silver nitrate when she is here. The wound continues to be smaller 10/2; patient missed her appointment last week due to GI issues. She left and Iodoflex based dressing on for 2 weeks. Wound is about the same size about the size of a dime on the left medial lower 10/9 we have been using Iodoflex on the medial left ankle wound. She has a new superficial probable wrap injury on the dorsal left ankle 10/16; we have been using Hydrofera Blue since last week. This is on the left medial ankle 10/23; we have been using Hydrofera Blue since 2 weeks ago. This is on the left medial ankle. Dimensions are better 11/6; using Hydrofera Blue. I think the wound is smaller but still not closed. Left medial ankle 11/13; we have been using Hydrofera Blue. Wound is certainly no smaller this week. Also the surface not as good. This is the remanent of a very large area on her left medial ankle. 11/20; using Sorbact since last week. Wound was about the same in terms of size although I was disappointed about the surface debris 12/11; 3-week  follow-up. Patient was on vacation. Wound is measuring slightly larger we have been using Sorbact. 12/18; wound is about the same size  however surface looks better last week after debridement. We have been using Sorbact under compression 1/15 wound is probably twice the size of last time increased in length nonviable surface. We have been using Sorbact. She was running a mild fever and missed her appointment last week 1/22; the wound is come down in size but under illumination still a very adherent debris we have been Hydrofera Blue that I changed her to last week 1/29; dimensions down slightly. We have been using Hydrofera Blue 2/19 dimensions are the same however there is rims of epithelialization under illumination. Therefore more the surface area may be epithelialized 2/26; the patient's wound actually measures smaller. The wound looks healthy. We have been using Hydrofera Blue. I had some thoughts about running Apligraf then I still may do that however this looks so much better this week we will delay that for now 3/5; the wound is small but about the same as last week. We have been using Hydrofera Blue. No debridement is required today. 3/19; the wound is about the size of a dime. Healthy looking wound even under illumination. We have been using Hydrofera Blue. No mechanical debridement is necessary 3/26; not much change from last week although still looks very healthy. We have been using Hydrofera Blue under Unna boots Patient was offered an ankle fusion by podiatry but not until the wound heals with a proceed with this. 4/9; the patient comes in today with her original wound on the medial ankle looking satisfactory however she has some uncontrolled swelling in the middle part of her leg with 2 new open areas superiorly just lateral to the tibia. I think this was probably a wrap issue. She said she felt uncomfortable during the week but did not call in. We have been using Hydrofera  Blue 4/16; the wound on the medial ankle is about the same. She has innumerable small areas superior to this across her mid tibia. I think this is probably folliculitis. She is also been working in the yard doing a Field seismologist) Signed: 09/17/2019 6:55:41 AM By: Linton Ham MD Entered By: Linton Ham on 09/16/2019 10:13:15 -------------------------------------------------------------------------------- Physical Exam Details Patient Name: Date of Service: [name] 09/16/2019 9:30 AM Medical Record [id number] Patient Account Number: [id number] Date of Birth/Sex: Treating RN: January 06, 1956 (65 y.o. [name] Primary Care Provider: [name] Other Clinician: Referring Provider: Treating Provider/Extender:Randi College, Velva Harman, Essie Christine in Treatment: 353 Constitutional Sitting or standing Blood Pressure is within target range for patient.. Pulse regular and within target range for patient.Marland Kitchen Respirations regular, non-labored and within target range.. Temperature is normal and within the target range for the patient.Marland Kitchen Appears in no distress. Respiratory work of breathing is normal. Cardiovascular Pedal pulses are palpable. Not much in the way of edema. Integumentary (Hair, Skin) No evidence of frank cellulitis. Psychiatric appears at normal baseline. Notes Wound exam; left medial malleolus under illumination not too bad although certainly not smaller and really no better. She continues to have multiple small excoriations going laterally across the tibia into her upper lateral calf. I suspect this is sweats under compression she is out working in her yard. Cannot rule out folliculitis and I am going to give her a course of antibiotic Electronic Signature(s) Signed: 09/17/2019 6:55:41 AM By: Linton Ham MD Entered By: Linton Ham on 09/16/2019  10:15:04 -------------------------------------------------------------------------------- Physician Orders Details Patient Name: Date of Service: [name] 09/16/2019 9:30 AM Medical Record [id number] Patient Account Number: [id number] Date of Birth/Sex: Treating  RN: [birth year] (64 y.o. 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 - MD visit Nurse Visit: - next week, Monday Dressing Change Frequency Wound #3 Left,Medial Malleolus Do not change entire dressing for one week. - can remove before doctor appointment on Monday Skin Barriers/Peri-Wound Care Moisturizing lotion TCA Cream or Ointment - mixed with lotion Wound Cleansing Clean wound with Wound Cleanser - clean with anasept with dressing changes May shower with protection. Primary Wound Dressing Wound #12 Left,Proximal,Anterior Lower Leg Calcium Alginate with Silver Wound #13 Left,Distal,Anterior Lower Leg Calcium Alginate with Silver Wound #3 Left,Medial Malleolus Hydrofera Blue - classic Secondary Dressing Wound #3 Left,Medial Malleolus Dry Gauze Edema Control Unna Boot to Left Lower Extremity Avoid standing for long periods of time Elevate legs to the level of the heart or above for 30 minutes daily and/or when sitting, a frequency of: - throughout the day Support Garment 20-30 mm/Hg pressure to: - compression stocking right leg daily Patient Medications Allergies: penicillin, doxycycline Notifications Medication Indication Start End sulfamethoxazole-trimethoprim 09/16/2019 DOSE oral 800 mg-160  mg tablet - 1 tablet oral bid for 7 days Electronic Signature(s) Signed: 09/16/2019 10:18:06 AM By: Linton Ham MD Entered By: Linton Ham on 09/16/2019 10:18:02 -------------------------------------------------------------------------------- Problem List Details Patient Name: Date of Service: [name] 09/16/2019 9:30 AM Medical Record [id number] Patient Account Number: [id number] Date of Birth/Sex: Treating RN: [birth year] (64 y.o. 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 08/03/2014 08/03/2014 and inflammation L03.116 Cellulitis of left lower limb 09/07/2014 09/07/2014 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 11/16/2014 11/16/2014 and inflammation Resolved Problems ICD-10 Code Description Active Date Resolved Date L97.312 Non-pressure chronic ulcer of right ankle with fat layer 04/10/2016 04/10/2016 exposed Electronic Signature(s) Signed: 09/17/2019 6:55:41 AM By: Linton Ham MD Entered By: Linton Ham on 09/16/2019 10:12:08 -------------------------------------------------------------------------------- Progress Note Details Patient Name: Date of Service: [name] 09/16/2019 9:30 AM Medical Record [id number] Patient Account Number: [id number] Date of Birth/Sex: Treating RN: March 21, 1956 (64 y.o. [name] Primary Care Provider: [name] Other  Clinician: Referring Provider: Treating Provider/Extender:Montarius Kitagawa, Tawny Asal in Treatment: 353 Subjective History of Present Illness (HPI) the remaining wound is over the left medial ankle. Similar wound over the right medial ankle healed largely with use of Apligraf. Most recently we have been using Hydrofera Blue over this wound with considerable improvement. The patient has been extensively worked up in the past for her venous insufficiency and she is not a candidate for antireflux surgery although I have none of the details available currently. 08/24/14; considerable improvement today. About 50% of this wound areas now epithelialized. The base of the wound appears to be healthier granulation.as opposed to last week when she had deteriorated a considerable improvement 08/17/14;  unfortunately the wound has regressed somewhat. The areas of epithelialization from the superior aspect are not nearly as healthy as they were last week. The patient thinks her Hydrofera Blue slipped. 09/07/14; unfortunately the area has markedly regressed in the 2 weeks since I've seen this. There is an odor surrounding erythema. The healthy granulation tissue that we had at the base of the wound now is a dusky color. The nurse reports green drainage 09/14/14; the area looks somewhat better than last week. There is less erythema and less drainage. The culture I did did not show any growth. Nevertheless I think it is better to continue the Cipro and doxycycline for a further week. The remaining wound area was debridement. 09/21/14. Wound did not require debridement last week. Still less erythema and less drainage. She can complete her antibiotics. The areas of epithelialization in the superior aspect of the wound do not look as healthy as they did some weeks ago 10/05/14 continued improvement in the condition of this wound. There is advancing epithelialization. Less aggressive debridement  required 10/19/14 continued improvement in the condition and volume of this wound. Less aggressive debridement to the inferior part of this to remove surface slough and fibrinous eschar 11/02/14 no debridement is required. The surface granulation appears healthy although some of her islands of epithelialization seem to have regressed. No evidence of infection 11/16/14; lites surface debridement done of surface eschar. The wound does not look to be unhealthy. No evidence of infection. Unfortunately the patient has had podiatry issues in the right foot and for some reason has redeveloped small surface ulcerations in the medial right ankle. Her original presentation involved wounds in this area 11/23/14 no debridement. The area on the right ankle has enlarged. The left ankle wound appears stable in terms of the surface although there is periwound inflammation. There has been regression in the amount of new skin 11/30/14 no debridement. Both wound areas appear healthy. There was no evidence of infection. The the new area on the right medial ankle has enlarged although that both the surfaces appear to be stable. 12/07/14; Debridement of the right medial ankle wound. No no debridement was done on the left. 12/14/14 no major change in and now bilateral medial ankle wounds. Both of these are very painful but the no overt evidence of infection. She has had previous venous ablation 12/21/14; patient states that her right medial ankle wound is considerably more painful last week than usual. Her left is also somewhat painful. She could not tolerate debridement. The right medial ankle wound has fibrinous surface eschar 12/28/14 this is a patient with severe bilateral venous insufficiency ulcers. For a considerable period of time we actually had the one on the right medial ankle healed however this recently opened up again in June. The left medial ankle wound has been a refractory area with some absent flows. We had  some success with Hydrofera Blue on this area and it literally closed by 50% however it is recently opened up Foley. Both of these were debridement today of surface eschar. She tolerates this poorly 01/25/15: No change in the status of this. Thick adherent escar. Very poor tolerance of any attempt at debridement. I had healed the right medial malleolus wound for a considerable amount of time and had the left one down to about 50% of the volume although this is totally regressed over the last 48 weeks. Further the right leg has reopened. she is trying to make a appointment with pain and vascular, previous  ablations with Dr. Aleda Grana. I do not believe there is an arterial insufficiency issue here 02/01/15 the status of the adherent eschar bilaterally is actually improved. No debridement was done. She did not manage to get vascular studies done 02/08/15 continued debridement of the area was done today. The slough is less adherent and comes off with less pressure. There is no surrounding infection peripheral pulses are intact 02/15/15 selective debridement with a disposable curette. Again the slough is less adherent and comes off with less difficulty. No surrounding infection peripheral pulses are intact. 02/22/15 selective debridement of the right medial ankle wound. Slough comes off with less difficulty. No obvious surrounding infection peripheral pulses are intact I did not debridement the one on the left. Both of these are stable to improved 03/01/15 selective debridement of both wound areas using a curette to. Adherent slough cup soft with less difficulty. No obvious surrounding infection. The patient tells me that 2 days ago she noted a rash above the right leg wrap. She did not have this on her lower legs when she change this over she arrives with widespread left greater than right almost folliculitis-looking rash which is extremely pruritic. I don't see anything to culture here. There is no rash  on the rest of her body. She feels well systemically. 03/08/15; selective debridement of both wounds using a curette. Base of this does not look unhealthy. She had limegreen drainage coming out of the left leg wound and describes a lot of drainage. The rash on her left leg looks improved to. No cultures were done. 03/22/15; patient was not here last week. Basal wounds does not look healthy and there is no surrounding erythema. No drainage. There is still a rash on the left leg that almost looks vasculitic however it is clearly limited to the top of where the wrap would be. 04/05/15; on the right required a surgical debridement of surface eschar and necrotic subcutaneous tissue. I did not debridement the area on the left. These continue to be large open wounds that are not changing that much. We were successful at one point in healing the area on the right, and at the same time the area on the left was roughly half the size of current measurements. I think a lot of the deterioration has to do with the prolonged time the patient is on her feet at work 04/19/15 I attempted-like surface debridement bilaterally she does not tolerate this. She tells me that she was in allergic care yesterday with extreme pain over her left lateral malleolus/ankle and was told that she has an "sprain" 05/03/15; large bilateral venous insufficiency wounds over the medial malleolus/medial aspect of her ankles. She complains of copious amounts of drainage and his usual large amounts of pain. There is some increasing erythema around the wound on the right extending into the medial aspect of her foot to. historically she came in with these wounds the right one healed and the left one came down to roughly half its current size however the right one is reopened and the left is expanded. 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 05/10/15 large bilateral venous insufficiency  wounds. There is less adherence surface left however the surface culture that I did last week grew pseudomonas therefore bilateral selective debridement score necessary. There is surrounding erythema. The patient describes severe bilateral drainage and a lot of pain in the left ankle. Apparently her podiatrist was  were ready to do a cortisone shot 05/17/15; the patient complains of pain and again copious amounts of drainage. 05/24/15; we used Iodo flex last week. Patient notes considerable improvement in wound drainage. Only needed to change this once. 05/31/15; we continued Iodoflex; the base of these large wounds bilaterally is not too bad but there is probably likely a significant bioburden here. I would like to debridement just doesn't tolerate it. 06/06/14 I would like to continue the Iodoflex although she still hasn't managed to obtain supplies. She has bilateral medial malleoli or large wounds which are mostly superficial. Both of them are covered circumferentially with some nonviable fibrinous slough although she tolerates debridement very poorly. She apparently has an appointment for an ablation on the right leg by interventional radiology. 06/14/15; the patient arrives with the wounds and static condition. We attempted a debridement although she does not do well with this secondary to pain. I 07/05/15; wounds are not much smaller however there appears to be a cleaner granulating base. The left has tight fibrinous slough greater than the right. Debridement is tolerated poorly due to pain. Iodoflex is done more for these wounds in any of the multitude of different dressings I have tried on the left 1 and then subsequently the right. 07/12/15; no change in the condition of this wound. I am able to do an aggressive debridement on the right but not the left. She simply cannot tolerate it. We have been using Iodoflex which helps somewhat. It is worthwhile remembering that at one point we healed the  right medial ankle wound and the left was about 25% of the current circumference. We have suggested returning to vascular surgery for review of possible further ablations for one reason or another she has not been able to do this. 07/26/15 no major change in the condition of either wound on her medial ankle. I did not attempt to debridement of these. She has been aggressively scrubbing these while she is in the shower at home. She has her supply of Iodoflex which seems to have done more for these wounds then anything I have put on recently. 08/09/15 wound area appears larger although not verified by measurements. Using Iodoflex 09/05/2015 -- she was here for avisit today but had significant problems with the wound and I was asked to see her for a physician opinion. I have summarize that this lady has had surgery on her left lower extremity about 10 years ago where the possible veins stripping was done. She has had an opinion from interventional radiology around November 2016 where no further sclerotherapy was ordered. The patient works 12 hours a day and stands on a concrete floor with work boots and is unable to get the proper compression she requires and cannot elevate her limbs appropriately at any given time. She has recently grown Pseudomonas from her wound culture but has not started her ciprofloxacin which was called in for her. 09/13/15 this continues to be a difficult situation for this patient. At one point I had this wound down to a 1.5 x 1.5" wound on her left leg. This is deteriorated and the right leg has reopened. She now has substantial wounds on her medial calcaneus, malleoli and into her lower leg. One on the left has surface eschar but these are far too painful for me to debridement here. She has a vascular surgery appointment next week to see if anything can be done to help here. I think she has had previous ablations several years ago  at [facility]. She has no major edema. She  tells me that she did not get product last time [facility]] and went for several days without it. She continues to work in work boots 12 hours a day. She cannot get compression/4-layer under her work boots. 09/20/15 no major change. Periwound edema control was not very good. Her point with pain and vascular is next Wednesday the 25th 09/28/15; the patient is seen vascular surgery and is apparently scheduled for repeat duplex ultrasounds of her bilateral lower legs next week. 10/05/15; the patient was seen by Dr. Doren Custard of vascular surgery. He feels that she should have arterial insufficiency excluded as cause/contributed to her nonhealing stage she is therefore booked for an arteriogram. She has apparently monophasic signals in the dorsalis pedis pulses. She also of course has known severe chronic venous insufficiency with previous procedures as noted previously. I had another long discussion with the patient today about her continuing to work 12 hour shifts. I've written her out for 2 months area had concerns about this as her work location is currently undergoing significant turmoil and this may lead to her termination. She is aware of this however I agree with her that she simply cannot continue to stand for 12 hours multiple days a week with the substantial wound areas she has. 10/19/15; the Dr. Doren Custard appointment was largely for an arteriogram which was normal. She does not have an arterial issue. He didn't make a comment about her chronic venous insufficiency for which she has had previous ablations. Presumably it was not felt that anything additional could be done. The patient is now out of work as I prescribed 2 weeks ago. Her wounds look somewhat less aggravated presumably because of this. I felt I would give debridement another try today 10/25/15; no major change in this patient's wounds. We are struggling to get her product that she can afford into her own home through her  insurance. 11/01/15; no major change in the patient's wounds. I have been using silver alginate as the most affordable product. I spoke to Dr. Marla Roe last week with her requested take her to the OR for surgical debridement and placement of ACEL. Dr. Marla Roe told me that she would be willing to do this however [facility] will not cover this, fortunately the patient has [place] healthcare of some variant 11/08/15; no major change in the patient's wounds. She has been completely nonviable surface that this but is in too much pain with any attempted debridement are clinic. I have arranged for her to see Dr. Marla Roe ham of plastic surgery and this appointment is on Monday. I am hopeful that they will take her to the OR for debridement, possible ACEL ultimately possible skin graft 11/22/15 no major change in the patient's wounds over her bilateral medial calcaneus medial malleolus into the lower legs. Surface on these does not look too bad however on the left there is surrounding erythema and tenderness. This may be cellulitis or could him sleepy tinea. 11/29/15; no major changes in the patient's wounds over her bilateral medial malleolus. There is no infection here and I don't think any additional antibiotics are necessary. There is now plan to move forward. She sees Dr. Marla Roe in a week's time for preparation for operative debridement and ACEL placement I believe on 7/12. She then has a follow-up appointment with Dr. Marla Roe on 7/21 12/28/15; the patient returns today having been taken to the [place] by Dr. Marla Roe 12/12/15 she underwent debridement,  intraoperative cultures [which were negative]. She had placement of a wound VAC. Parent really ACEL was not available to be placed. The wound VAC foam apparently adhered to the wound since then she's been using silver alginate, Xeroform under Ace wraps. She still says there is a lot of drainage and a lot of pain 01/31/16; this is a  patient I see monthly. I had referred her to Dr. Marla Roe him of plastic surgery for large wounds on her bilateral medial ankles. She has been to the OR twice once in early July and once in early August. She tells me over the last 3 weeks she has been using the wound VAC with ACEL underneath it. On the right we've simply been using silver alginate. Under Kerlix Coban wraps. 02/28/16; this is a patient I'm currently seeing monthly. She is gone on to have a skin graft over her large venous insufficiency ulcer on the left medial ankle. This was done by Dr. Marla Roe him. The patient is a bit perturbed about why she didn't have one on her right medial ankle wound. She has been using silver alginate to this. 03/06/16; I received a phone call from her plastic surgery Dr. Marla Roe. She expressed some concern about the viability of the skin graft she did on the left medial ankle wound. Asked me to place Endoform on this. She told me she is not planning to do a subsequent skin graft on the right as the left one did not take very well. I had placed Hydrofera Blue on the right 03/13/16; continue to have a reasonably healthy wound on the right medial ankle. Down to 3 mm in terms of size. There is epithelialization here. The area on the left medial ankle is her skin graft site. I suppose the last week this looks somewhat better. She has an open area inferiorly however in the center there appears to be some viable tissue. There is a lot of surface callus and eschar that will eventually need to come off however none of this looked to be infected. Patient states that the is able to keep the dressing on for several days which is an improvement. 03/20/16 no major change in the circumference of either wound however on the left side the patient was at Dr. Eusebio Friendly office and they did a debridement of left wound. 50% of the wound seems to be epithelialized. I been using Endoform on the left Hydrofera Blue in the  right 03/27/16; she arrives today with her wound is not looking as healthy as they did last week. The area on the right clearly has an adherent surface to this a very similar surface on the left. Unfortunately for this patient this is all too familiar problem. Clearly the Endoform is not working and will need to change that today that has some potential to help this surface. She does not tolerate debridement in this clinic very well. She is changing the dressing wants 04/03/16; patient arrives with the wounds looking somewhat better especially on the right. Dr. Migdalia Dk change the dressing to silver alginate when she saw her on Monday and also sold her some compression socks. The usefulness of the latter is really not clear and woman with severely draining wounds. 04/10/16; the patient is doing a bit of an experiment wearing the compression stockings that Dr. Migdalia Dk provided her to her left leg and the out of legs based dressings that we provided to the right. 05/01/16; the patient is continuing to wear compression stockings Dr. Migdalia Dk provided  her on the left that are apparently silver impregnated. She has been using Iodoflex to the right leg wound. Still a moderate amount of drainage, when she leaves here the wraps only last for 4 days. She has to change the stocking on the left leg every night 05/15/16; she is now using compression stockings bilaterally provided by Dr. Marla Roe. She is wearing a nonadherent layer over the wounds so really I don't think there is anything specific being done to this now. She has some reduction on the left wound. The right is stable. I think all healing here is being done without a specific dressing 06/09/16; patient arrives here today with not much change in the wound certainly in diameter to large circular wounds over the medial aspect of her ankle bilaterally. Under the light of these services are certainly not viable for healing. There is no evidence of  surrounding infection. She is wearing compression stockings with some sort of silver impregnation as prescribed by Dr. Marla Roe. She has a follow-up with her tomorrow. 06/30/16; no major change in the size or condition of her wounds. These are still probably covered with a nonviable surface. She is using only her purchase stockings. She did see Dr. Marla Roe who seemed to want to apply Dakin's solution to this I'm not extreme short what value this would be. I would suggest Iodoflex which she still has at home. 07/28/16; I follow Mrs. Merta episodically along with Dr. Marla Roe. 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 She has been applying a topical collagen ointment to both wounds with Adaptic. I don't think Dr. Marla Roe is planning to take her back to the OR. 08/19/16; I follow Mrs. Jeneen Rinks on a monthly basis along with Dr. Marla Roe of plastic surgery. 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 I been following her for a number of years. At one point I was able to get the right medial malleolus wound to heal and had the left medial malleolus down to about half its current size however and I had to send her to plastic surgery for an operative debridement. Since then things have been stable to slightly improve the area on the right is slightly better one in the left about the same although there is much less adherent surface than I'm used to with this patient. She is using some form of liquid collagen gel that Dr. Marla Roe provided a Kerlix cover with the patient's own pressure stockings. She tells me that she has extreme pain in both ankles and along the lateral aspect of both feet. She has been unable to work for some period of time. She is telling me she is retiring at the beginning of April. She sees Dr. Doran Durand of orthopedics next week 09/22/16; patient has  not seen Dr. Marla Roe since the last time she is here. I'm not really sure what she is using to the wounds other than bits and pieces of think she had left over including most recently Hydrofera Blue. She is using juxtalite stockings. She is having difficulty with her husband's recent illness "stroke". She is having to transport him to various doctors appointments. Dr. Marla Roe left her the option of a repeat debridement with ACEL however she has not been able to get the time to follow-up on this. She continues to have a fair amount of drainage out of these wounds with certainly precludes leaving dressings on all week  10/13/16; patient has not seen Dr. Marla Roe since she was last in our clinic. I'm not really sure what she is doing with the wounds, we did try to get her [facility] and I think she is actually using this most of the time. Because of drainage she states she has to change this every second day although this is an improvement from what she used to do. She went to see Dr. Doran Durand who did not think she had a muscular issue with regards to her feet, he referred her to a neurologist and I think the appointment is sometime in June. I changed her back to Iodoflex which she has used in the past but not recently. 11/03/16; the patient has been using Iodoflex although she ran out of this. Still claims that there is a lot of drainage although the wound does not look like this. No surrounding erythema. She has not been back to see Dr. Marla Roe 11/24/16; the patient has been using Iodoflex again but she ran out of it 2 or 3 days ago. There is no major change in the condition of either one of these wounds in fact they are larger and covered in a thick adherent surface slough/nonviable tissue especially on the left. She does not tolerate mechanical debridement in our clinic. Going back to see Dr. Marla Roe of plastic surgery for an operative debridement would seem reasonable. 12/15/16; the  patient has not been back to see Dr. Marla Roe. She is been dealing with a series of illnesses and her husband which of monopolized her time. She is been using Sorbact which we largely supplied. She states the drainage is bad enough that it maximum she can go 2-3 days without changing the dressing 01/12/2017 -- the patient has not been back for about 4 weeks and has not seen Dr. Marla Roe not does she have any appointment pending. 01/23/17; patient has not seen Dr. Marla Roe even though I suggested this previously. She is using Santyl that was suggested last week by Dr. Con Memos this Cost her $16 through her insurance which is indeed surprising 02/12/17; continuing Santyl and the patient is changing this daily. A lot of drainage. She has not been back to see plastic surgery she is using an Ace wrap. Our intake nurse suggested wrap around stockings which would make a good reasonable alternative 02/26/17; patient is been using Santyl and changing this daily due to drainage. She has not been to see plastic surgery she uses in April Ace wrap to control the edema. She did obtain extremitease stockings but stated that the edema in her leg was to big for these 03/20/17; patient is using Santyl and Anasept. Surfaces looked better today the area on the right is actually measuring a little smaller. She has states she has a lot of pain in her feet and ankles and is asking for a consult to pain control which I'll try to help her with through our case manager. 04/10/17; the patient arrives with better-looking wound surfaces and is slightly smaller wound on the left she is using a combination of Santyl and Anasept. She has an appointment or at least as started in the pain control center associated with [facility] 05/14/17; this is a patient who I followed for a prolonged period of time. 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 At one point I had this down to a much smaller wound  on the left however these reopened and we've never been able to get these to heal.  She has been using Santyl and Anasept gel although 2 weeks ago she ran out of the Anasept gel. She has a stable appearance of the wound. She is going to the wound care clinic at [facility]. They wanted do a nerve block/spinal block although she tells me she is reluctant to go forward with that. 05/21/17; this is a patient I have followed for many years. 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 Chronic pain and deformity in her ankles as well. She is been to see plastic surgery as well as orthopedics. Using PolyMem AG most recently/Kerramax/ABDs and 2 layer compression. She has managed to keep this on and she is coming in for a nurse check to change the dressing on Tuesdays, we see her on Fridays 06/05/17; really quite a good looking surface and the area especially on the right medial has contracted in terms of dimensions. Well granulated healthy-looking tissue on both sides. Even with an open curet there is nothing that even feels abnormal here. This is as good as I've seen this in quite some time. We have been using PolyMem AG and bringing her in for a nurse check 06/12/17; really quite good surface on both of these wounds. The right medial has contracted a bit left is not. We've been using PolyMem and AG and she is coming in for a nurse visit 06/19/17; we have been using PolyMem AG and bringing her in for a nurse check. Dimensions of her wounds are not better but the surfaces looked better bilaterally. She complained of bleeding last night and the left wound and increasing pain bilaterally. She states her wound pain is more neuropathic than just the wounds. There was some suggestion that this was radicular from her pain management doctor in talking to her it is really difficult to sort this out. 06/26/17; using PolyMem and AG and bringing her in for a nurse check as All of this and  reasonably stable condition. Certainly not improved. The dimensions on the lateral part of the right leg look better but not really measuring better. The medial aspect on the left is about the same. 07/03/16; we have been using PolyMen AG and bringing her in for a nurse check to change the dressings as the wounds have drainage which precludes once weekly changing. We are using all secondary absorptive dressings.our intake nurse is brought up the idea of using a wound VAC/snap VAC on the wound to help with the drainage to see if this would result in some contraction. This is not a bad idea. The area on the right medial is actually looking smaller. Both wounds have a reasonable-looking surface. There is no evidence of cellulitis. The edema is well controlled 07/10/17; the patient was denied for a snap VAC by her insurance. The major issue with these wounds continues to be drainage. We are using wicked PolyMem AG and she is coming in for a nurse visit to change this. The wounds are stable to slightly improved. The surface looks vibrant and the area on the right certainly has shrunk in size but very slowly 07/17/17; the patient still has large wounds on her bilateral medial malleoli. Surface of both of these wounds looks better. The dimensions seem to come and go but no consistent improvement. There is no epithelialization. We do not have options for advanced treatment products due to insurance issues. They did not approve of the wound VAC to help control the drainage. More recently we've been using PolyMem and AG  wicked to allow drainage through. We have been bringing her in for a nurse visit to change this. We do not have a lot of options for wound care products and the home again due to insurance issues 07/24/17; the patient's wound actually looks somewhat better today. No drainage measurements are smaller still healthy-looking surface. We used silver collagen under PolyMen started last week. We have  been bringing her in for a dressing change 07/31/17; patient's wound surface continued to look better and I think there is visible change in the dimensions of the wound on the right. Rims of epithelialization. We have been using silver collagen under PolyMen and bringing her in for a dressing change. There appears to be less drainage although she is still in need of the dressing change 08/07/17. Patient's wound surface continues to look better on both sides and the area on the right is definitely smaller. We have been using silver collagen and PolyMen. She feels that the drainage has been it has been better. I asked her about her vascular status. She went to see Dr. Aleda Grana at [facility] and had some form of ablation. I don't have much detail on this. I haven't my notes from 2016 that she was not a candidate for any further ablation but I don't have any more information on this. We had referred her to vein and vascular I don't think she ever went. He does not have a history of PAD although I don't have any information on this either. We don't even have ABIs in our record 08/14/17; we've been using silver collagen and PolyMen cover. And putting the patient and compression. She we are bringing her in as a nurse visit to change this because ofarge amount of drainage. We didn't the ABIs in clinic today since they had been done in many moons 1.2 bilaterally. She has been to see vein and vascular however this was at [facility] and she had ablation although I really don't have any information on this all seemed biking get a report. She is also been operatively debrided by plastic surgery and had a cell placed probably 8-12 months ago. This didn't have a major effect. We've been making some gains with current dressings 08/19/17-She is here in follow-up evaluation for bilateral medial malleoli ulcers. She continues to tolerate debridement very poorly. We will continue with recently changed topical  treatment; if no significant improvement may consider switching to Iodosorb/Iodoflex. 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 She has been using silver collagen and PolyMem. I believe she has been used and tried on Iodoflex before. 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 These have been very refractory over time. We have been using silver collagen and PolyMen. She is coming in once a week for a doctor's and once a week for nurse visits. We are actually making some progress 09/18/17; the patient's wounds are smaller especially on the right medial. She arrives today to upset to consider even washing these off with Anasept which I think is been part of the reason this is been closing. We've been using collagen covered in PolyMen otherwise. It is noted that she has a small area of folliculitis on the right medial calf that. As we are wrapping her legs I'll give her a short course  of doxycycline to make sure this doesn't amount to anything. She is a long list of complaints today including imbalance, shortness of breath on exertion, inversion of her left ankle. With regards to the latter complaints she is been to see orthopedics and they offered her a tendon release surgery I believe but wanted her wounds to be closed first. I have recommended she go see her primary physician with regards to everything else. 09/25/17; patient's wounds are about the same size. We have made some progress bilaterally although not in recent weeks. She will not allow me T wash these wounds with Anasept even if she is doing her cell. Wheeze we've been using collagen covered in PolyMen. Last week she had a small area of folliculitis this is now opened into a small wound. She  completed 5 days of trimethoprim sulfamethoxazole 10/02/17; unfortunately the area on her left medial ankle is worse with a larger wound area towards the Achilles. The patient complains of a lot of pain. She will not allow debridement although visually I don't think there is anything to debridement in any case. We have been using silver collagen and PolyMen for several months now. Initially we are making some progress although I'm not really seeing that today. We will move back to [facility]. His admittedly this is a bit of a repeat however I'm hoping that his situation is different now. The patient tells me she had her leg on the left give out on her yesterday this is process some pain. 10/09/17; the patient is seen twice a week largely because of drainage issues coming out of the chronic medial bimalleolar wounds that are chronic. Last week the dimensions of the one on the left looks a little larger I changed her to [facility]. She comes in today with a history of terrible pain in the bilateral wound areas. She will not allow debridement. She will not even allow a tissue culture. There is no surrounding erythema no no evidence of cellulitis. We have been putting her Kerlix Coban man. She will not allow more aggressive compression as there was a suggestion to put her in 3 layer wraps. 10/16/17; large wounds on her bilateral medial malleoli. These are chronic. Not much change from last week. The surface looks have healthy but absolutely no epithelialization. A lot of pain little less so of drainage. She will not allow debridement or even washing these off in the vigorous fashion with Anasept. 10/23/17; large wounds on her bilateral malleoli which are chronic. Some improvement in terms of size perhaps on the right since last time I saw these. She states that after we increased the 3 layer compression there was some bleeding, when she came in for a nurse visit she did not want 3 layer compression  put back on about our nurse managed to convince her. She has known chronic venous visit issues and I'm hoping to get her to tolerate the 3 layer compression. using Hydrofera Blue 10/30/17; absolutely no change in the condition of either wound although we've had some improvement in dimensions on the right.. Attempted to put her in 3 layer compression she didn't tolerated she is back in 2 layer compression. We've been using Hydrofera Blue We looked over her past records. She had venous reflux studies in November 2016. There was no evidence of deep venous reflux on the right. Superficial vein did not show the greater saphenous vein at think this is been previously ablated the small saphenous vein was within normal limits.  The left deep venous system showed no DVT the vessels were positive for deep venous reflux in the posterior tibial veins at the ankle. The greater saphenous vein was surgically absent small saphenous vein was within normal limits. She went to vein and vascular at [facility]. I believe she had an ablation on the left greater saphenous vein. I'll update her reflux studies perhaps ever reviewed by vein and vascular. We've made absolutely no progress in these wounds. Will also try to read and TheraSkins through her insurance 11/06/17; W the patient apparently has a 2 week follow-up with vein and vascular I like him to review the whole issue with regards to her previous vascular workup by Dr. Aleda Grana. We've really made no progress on these wounds in many months. She arrives today with less viable looking surface on the left medial ankle wound. This was apparently looking about the same on Tuesday when she was here for nurse visit. 11/13/17; deep tissue culture I did last time of the left lower leg showed multiple organisms without any predominating. In particular no Staphylococcus or group A strep were isolated. We sent her for venous reflux studies. She's had a previous left  greater saphenous vein stripping and I think sclerotherapy of the right greater saphenous vein. She didn't really look at the lesser saphenous vein this both wounds are on the medial aspect. She has reflux in the common femoral vein and popliteal vein and an accessory vein on the right and the common femoral vein and popliteal vein on the left. I'm going to have her go to see vein and vascular just the look over things and see if anything else beside aggressive compression is indicated here. We have not been able to make any progress on these wounds in spite of the fact that the surface of the wounds is never look too bad. 11/20/17; no major change in the condition of the wounds. Patient reports a large amount of drainage. She has a lot of complaints of pain although enlisting her today I wonder if some of this at least his neuropathic rather than secondary to her wounds. She has an appointment with vein and vascular on 12/30/17. The refractory nature of these wounds in my mind at least need vein and vascular to look over the wounds the recent reflux studies we did and her history to see if anything further can be done here. I also note her gait is deteriorated quite a bit. 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 She has a bilateral Trendelenburg gait. I wonder if this is neuropathic or perhaps multilevel radicular. 11/27/17; her wounds actually looks slightly better. Healthy-looking granulation tissue a scant amount of epithelialization. [place] [facility] will not pay for [employer]. They will play for tri layer Oasis and Dermagraft. This is not a diabetic ulcer. We'll try for the tri layer Oasis. She still complains of some drainage. She has a vein and vascular appointment on 12/30/17 12/04/17; the wounds visually look quite good. Healthy-looking granulation with some degree of epithelialization. We are still waiting for response to our request for trial to try layer Oasis. Her  appointment with vascular to review venous and arterial issues isn't sold the end of July 7/31. Not allow debridement or even vigorous cleansing of the wound surface. 12/18/17; slightly smaller especially on the right. 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 We've been using Hydrofera Blue. We still are looking into triple layer Oasis through her insurance 01/08/18 on evaluation  today patient's wound actually appears to be showing signs of good improvement at this point in time. She has been tolerating the dressing changes without complication. Fortunately there does not appear to be any evidence of infection at this point in time. We have been utilizing silver nitrate which does seem to be of benefit for her which is also good news. Overall I'm very happy with how things seem to be both regards appearance as well as measurement. Patient did see Dr. Bridgett Larsson for evaluation on 12/30/17. In his assessment he felt that stripping would not likely add much more than chronic compression to the patient's healing process. His recommendation was to follow-up in three months with Dr. Doren Custard if she hasn't healed in order to consider referral back to you and see vascular where she previously was in a trial and was able to get her wound to heal. I'll be see what she feels she when you staying compression and he reiterated this as well. 01/13/18 on evaluation today patient appears to actually be doing very well in regard to her bilateral medial malleolus ulcers. She seems to have tolerated the chemical cauterization with silver nitrate last week she did have some pain through that evening but fortunately states that I'll be see since it seems to be doing better she is overall pleased with the progress. 01/21/18; really quite a remarkable improvement since I've last seen these wounds. We started using silver nitrate specially on the islands of hyper granulation which for some reason her  around the wound circumference. This is really done quite nicely. Primary dressing Hydrofera Blue under 4 layer compression. She seems to be able to hold out without a nurse rewrap. Follow-up in 1 week 01/28/18; we've continued the hydrofera blue but continued with chemical cauterization to the wound area that we started about a month ago for irregular hyper granulation. She is made almost stunning improvement in the overall wound dimensions. I was not really expecting this degree of improvement in these chronic wounds 02/05/18; we continue with Hydrofera Bluebut of also continued the aggressive chemical cauterization with silver nitrate. We made nice progress with the right greater than left wound. 02/12/18. We continued with Hydrofera Blue after aggressive chemical cauterization with silver nitrate. We appear to be making nice progress with both wound areas 02/19/2018; we continue with [facility] after washing the wounds vigorously with Anasept spray and chemical cauterization with silver nitrate. We are making excellent progress. The area on the right's just about closed 02/26/2018. The area on the left medial ankle had too much necrotic debris today. I used a #5 curette we are able to get most of the soft. I continued with the silver nitrate to the much smaller wound on the right medial ankle she had a new area on her right lower pretibial area which she says was due to a role in her compression 03/05/2018; both wound areas look healthy. Not much change in dimensions from last week. I continue to use silver nitrate and Hydrofera Blue. The patient saw Dr. Doren Custard of vein and vascular. He felt she had venous stasis ulcers. He felt based on her previous arteriogram she should have adequate circulation for healing. Also she has deep venous reflux but really no significant correctable superficial venous reflux at this time. He felt we should continue with conservative management including leg elevation  and compression 04/02/2018; since we last saw this woman about a month ago she had a fall apparently suffered a pelvic fracture. I did  not look up the x-ray. Nevertheless because of pain she literally was bedbound for 2 weeks and had home health coming out to change the dressing. Somewhat predictably this is resulted in considerable improvement in both wound areas. The right is just about closed on the medial malleolus and the left is about half the size. 04/16/2018; both her wounds continue to go down in size. Using Hydrofera Blue. 05/07/18; both her wounds appeared to be improving especially on the right where it is almost closed. We are using Hydrofera Blue 05/14/2018; slightly worse this week with larger wounds. Surface on the left medial not quite as good. We have been using Hydrofera Blue 05/21/18; again the wounds are slightly larger. Left medial malleolus slightly larger with eschar around the circumference. We have been using Hydrofera Blue undergoing a wraps for a prolonged period of time. This got a lot better when she was more recumbent due to a fall and a back injury. I change the primary dressing the silver alginate today. She did not tolerate a 4 layer compression previously although I may need to bring this up with her next time 05/28/2018; area on the left medial malleolus again is slightly larger with more drainage. Area on the right is roughly unchanged. She has a small area of folliculitis on the right medial just on the lower calf. This does not look ominous. 06/03/2018 left medial malleolus slightly smaller in a better looking surface. We used silver nitrate on this last time with silver alginate. The area on the right appears slightly smaller 1/10; left medial malleolus slightly smaller. Small open area on the right. We used silver nitrate and silver alginate as of 2 weeks ago. We continue with the wound and compression. These got a lot better when she was off her feet 1/17;  right medial malleolus wound is smaller. The left may be slightly smaller. Both surfaces look somewhat better. 1/24; both wounds are slightly smaller. Using silver alginate under Unna boots 1/31; both wounds appear smaller in fact the area on the right medial is just about closed. Surface eschar. We have been using silver alginate under Unna boots. The patient is less active now spends let much less time on her feet and I think this is contributed to the general improvement in the wound condition 2/7; both wounds appear smaller. I was hopeful the right medial would be closed however there there is still the same small open area. Slight amount of surface eschar on the left the dimensions are smaller there is eschar but the wound edges appear to be free. We have been using silver alginate under Unna boot's 2/14; both wounds once again measure smaller. Circumferential eschar on the left medial. We have been using silver alginate under Unna boots with gradual improvement 2/21; the area on the right medial malleolus has healed. The area on the left is smaller. We have been using silver alginate and Unna boots. We can discharge wrapping the right leg she has 20/30 stockings at home she will need to protect the scar tissue in this area 2/28; the area on the right medial malleolus remains closed the patient has a compression stocking. The area on the left is smaller. We have been using silver alginate and Unna boots. 3/6 the area on the right medial ankle remains closed. Good edema control noted she is using her own compression stocking. The area on the left medial ankle is smaller. We have been managing this with silver alginate and Unna boots which  we will continue today. 3/13; the area on the right medial ankle remains closed and I'm declaring it healed today. When necessary the left is about the same still a healthy-looking surface but no major change and wound area. No evidence of infection  and using silver alginate under unna and generally making considerable improvement 3/27 the area on the right medial ankle remains closed the area on the left is about the same as last week. Certainly not any worse we have been using silver alginate under an Unna boot 4/3; the area on the right medial ankle remains closed per the patient. We did not look at this wound. The wound on the left medial ankle is about the same surface looks healthy we have been using silver alginate under an Unna boot 4/10; area on the right medial ankle remains closed per the patient. We did not look at this wound. The wound on the left medial ankle is slightly larger. The patient complains that the [facility] caused burning pain all week. She also told us that she was a lot more active this week. Changed her back to silver alginate 4/17; right medial ankle still closed per the patient. Left medial ankle is slightly larger. Using silver alginate. She did not tolerate Hydrofera Blue on this area 4/24; right medial ankle remains closed we have not look at this. The left medial ankle continues to get larger today by about a centimeter. We have been using silver alginate under Unna boots. She complains about 4 layer compression as an alternative. She has been up on her feet working on her garden 5/8; right medial ankle remains closed we did not look at this. The left medial ankle has increased in size about 100%. We have been using silver alginate under Unna boots. She noted increased pain this week and was not surprised that the wound is deteriorated 5/15; no major change in SA however much less erythema ( one week of doxy ocellulitis). 5/22-64 year old female returns at 1 week to the clinic for left medial ankle wound for which we have been using silver alginate under 3 layer compression She was placed on DOXY at last visit - the wound is wider at this visit. She is in 3 layer compression 5/29; change to [facility] last week. I had given her empiric doxycycline 2 weeks ago for a week. She is in 3 layer compression. She complains of a lot of pain and drainage on presentation today. 6/5; using Hydrofera Blue. I gave her doxycycline recently empirically for erythema and pain around the wound. Believe her cultures showed enterococcus which not would not have been well covered by doxycycline nevertheless the wound looks better and I don't feel specifically that the enterococcus needs to be covered. She has a new what looks like a wrap injury on her lateral left ankle. 6/12; she is using Hydrofera Blue. She has a new area on the left anterior lower tibial area. This was a wrap injury last week. 6/19; the patient is using Hydrofera Blue. She arrived with marked inflammation and erythema around the wound and tenderness. 12/01/18 on evaluation today patient appears to be doing a little bit better based on what I'm hearing from the standpoint of lassos evaluation to this as far as the overall appearance of the wound is concerned. Then sometime substandard she typically sees Dr. Dellia Nims. Nonetheless overall very pleased with the progress that she's made up to this point. No fevers, chills, nausea, or vomiting noted at this  time. 7/10; some improvement in the surface area. Aggressively debrided last week apparently. I went ahead with the debridement today although the patient does not tolerate this very well. We have been using Iodoflex. Still a fair amount of drainage 7/17; slightly smaller. Using Iodoflex. 7/24; no change from last week in terms of surface area. We have been using Iodoflex. Surface looks and continues to look somewhat better 7/31; surface area slightly smaller better looking surface. We have been using Iodoflex. This is under Unna boot compression 8/7-Patient presents at 1 week with Unna boot and Iodoflex, wound appears better 8/14-Patient presents at 1 week with Iodoflex, we use the Unna boot,  wound appears to be stable better.Patient is getting Botox treatment for the inversion of the foot for tendon release, Next week 8/21; we are using Iodoflex. Unna boot. The wound is stable in terms of surface area. Under illumination there is some areas of the wound that appear to be either epithelialized or perhaps this is adherent slough at this point I was not really clear. It did not wipe off and I was reluctant to debride this today. 8/28; we are using Iodoflex in an Unna boot. Seems to be making good improvement. 9/4; using Iodoflex and wound is slightly smaller. 9/18; we are using Iodoflex with topical silver nitrate when she is here. The wound continues to be smaller 10/2; patient missed her appointment last week due to GI issues. She left and Iodoflex based dressing on for 2 weeks. Wound is about the same size about the size of a dime on the left medial lower 10/9 we have been using Iodoflex on the medial left ankle wound. She has a new superficial probable wrap injury on the dorsal left ankle 10/16; we have been using Hydrofera Blue since last week. This is on the left medial ankle 10/23; we have been using Hydrofera Blue since 2 weeks ago. This is on the left medial ankle. Dimensions are better 11/6; using Hydrofera Blue. I think the wound is smaller but still not closed. Left medial ankle 11/13; we have been using Hydrofera Blue. Wound is certainly no smaller this week. Also the surface not as good. This is the remanent of a very large area on her left medial ankle. 11/20; using Sorbact since last week. Wound was about the same in terms of size although I was disappointed about the surface debris 12/11; 3-week follow-up. Patient was on vacation. Wound is measuring slightly larger we have been using Sorbact. 12/18; wound is about the same size however surface looks better last week after debridement. We have been using Sorbact under compression 1/15 wound is probably twice the size  of last time increased in length nonviable surface. We have been using Sorbact. She was running a mild fever and missed her appointment last week 1/22; the wound is come down in size but under illumination still a very adherent debris we have been Hydrofera Blue that I changed her to last week 1/29; dimensions down slightly. We have been using Hydrofera Blue 2/19 dimensions are the same however there is rims of epithelialization under illumination. Therefore more the surface area may be epithelialized 2/26; the patient's wound actually measures smaller. The wound looks healthy. We have been using Hydrofera Blue. I had some thoughts about running Apligraf then I still may do that however this looks so much better this week we will delay that for now 3/5; the wound is small but about the same as last week. We have been  using Hydrofera Blue. No debridement is required today. 3/19; the wound is about the size of a dime. Healthy looking wound even under illumination. We have been using Hydrofera Blue. No mechanical debridement is necessary 3/26; not much change from last week although still looks very healthy. We have been using Hydrofera Blue under Unna boots Patient was offered an ankle fusion by podiatry but not until the wound heals with a proceed with this. 4/9; the patient comes in today with her original wound on the medial ankle looking satisfactory however she has some uncontrolled swelling in the middle part of her leg with 2 new open areas superiorly just lateral to the tibia. I think this was probably a wrap issue. She said she felt uncomfortable during the week but did not call in. We have been using Hydrofera Blue 4/16; the wound on the medial ankle is about the same. She has innumerable small areas superior to this across her mid tibia. I think this is probably folliculitis. She is also been working in the yard doing a lot of sweating Objective Constitutional Sitting or standing  Blood Pressure is within target range for patient.. Pulse regular and within target range for patient.Marland Kitchen Respirations regular, non-labored and within target range.. Temperature is normal and within the target range for the patient.Marland Kitchen Appears in no distress. Vitals Time Taken: 9:37 AM, Height: 68 in, Weight: 127 lbs, BMI: 19.3, Temperature: 97.8 F, Pulse: 60 bpm, Respiratory Rate: 18 breaths/min, Blood Pressure: 122/61 mmHg. Respiratory work of breathing is normal. Cardiovascular Pedal pulses are palpable. Not much in the way of edema. Psychiatric appears at normal baseline. General Notes: Wound exam; left medial malleolus under illumination not too bad although certainly not smaller and really no better. ooShe continues to have multiple small excoriations going laterally across the tibia into her upper lateral calf. I suspect this is sweats under compression she is out working in her yard. Cannot rule out folliculitis and I am going to give her a course of antibiotic Integumentary (Hair, Skin) No evidence of frank cellulitis. Wound #12 status is Open. Original cause of wound was Gradually Appeared. The wound is located on the Left,Proximal,Anterior Lower Leg. The wound measures 0.6cm length x 0.6cm width x 0.1cm depth; 0.283cm^2 area and 0.028cm^3 volume. There is Fat Layer (Subcutaneous Tissue) Exposed exposed. There is no tunneling or undermining noted. There is a medium amount of serosanguineous drainage noted. The wound margin is distinct with the outline attached to the wound base. There is large (67-100%) red, pink granulation within the wound bed. There is a small (1-33%) amount of necrotic tissue within the wound bed including Adherent Slough. Wound #13 status is Open. Original cause of wound was Gradually Appeared. The wound is located on the [facility] Lower Leg. The wound measures 0.3cm length x 0.3cm width x 0.1cm depth; 0.071cm^2 area and 0.007cm^3 volume. There is  Fat Layer (Subcutaneous Tissue) Exposed exposed. There is no tunneling or undermining noted. There is a medium amount of serosanguineous drainage noted. The wound margin is distinct with the outline attached to the wound base. There is large (67-100%) red, pink granulation within the wound bed. There is a small (1-33%) amount of necrotic tissue within the wound bed including Adherent Slough. Wound #3 status is Open. Original cause of wound was Gradually Appeared. The wound is located on the Left,Medial Malleolus. The wound measures 1.4cm length x 0.9cm width x 0.1cm depth; 0.99cm^2 area and 0.099cm^3 volume. There is Fat Layer (Subcutaneous Tissue) Exposed exposed. There  is no tunneling or undermining noted. There is a medium amount of serosanguineous drainage noted. The wound margin is flat and intact. There is large (67-100%) red, pink granulation within the wound bed. There is a small (1-33%) amount of necrotic tissue within the wound bed including Adherent Slough. 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 There was a Haematologist Compression Therapy Procedure by Deon Pilling, RN. Post procedure Diagnosis Wound #12: Same as Pre-Procedure Wound #13 Pre-procedure diagnosis of Wound #13 is a Venous Leg Ulcer located on the Left,Distal,Anterior Lower Leg . There was a Haematologist Compression Therapy Procedure by Deon Pilling, RN. Post procedure Diagnosis Wound #13: Same as Pre-Procedure Wound #3 Pre-procedure diagnosis of Wound #3 is a Venous Leg Ulcer located on the Left,Medial Malleolus . There was a Haematologist Compression Therapy Procedure by Deon Pilling, RN. Post procedure Diagnosis Wound #3: Same as Pre-Procedure Plan Follow-up  Appointments: Return Appointment in 2 weeks. - MD visit Nurse Visit: - next week, Monday Dressing Change Frequency: Wound #3 Left,Medial Malleolus: Do not change entire dressing for one week. - can remove before doctor appointment on Monday Skin Barriers/Peri-Wound Care: Moisturizing lotion TCA Cream or Ointment - mixed with lotion Wound Cleansing: Clean wound with Wound Cleanser - clean with anasept with dressing changes May shower with protection. Primary Wound Dressing: Wound #12 Left,Proximal,Anterior Lower Leg: Calcium Alginate with Silver Wound #13 Left,Distal,Anterior Lower Leg: Calcium Alginate with Silver Wound #3 Left,Medial Malleolus: Hydrofera Blue - classic Secondary Dressing: Wound #3 Left,Medial Malleolus: Dry Gauze Edema Control: Unna Boot to Left Lower Extremity Avoid standing for long periods of time Elevate legs to the level of the heart or above for 30 minutes daily and/or when sitting, a frequency of: - throughout the day Support Garment 20-30 mm/Hg pressure to: - compression stocking right leg daily The following medication(s) was prescribed: sulfamethoxazole-trimethoprim oral 800 mg-160 mg tablet 1 tablet oral bid for 7 days starting 09/16/2019 1. TCA 2. Empiric trimethoprim sulfamethoxazole 1 p.o. twice daily for 7 days 3. Alginate to the excoriated areas 4. Hydrofera Blue to the original wound still under the same 5. I have asked her not to work the leg that hard Electronic Signature(s) Signed: 09/16/2019 10:18:36 AM By: Linton Ham MD Entered By: Linton Ham on 09/16/2019 10:18:36 -------------------------------------------------------------------------------- SuperBill Details Patient Name: Date of Service: [name] 09/16/2019 Medical Record [id number] Patient Account Number: [id number] Date of Birth/Sex: Treating RN: 02/25/1956 (64 y.o. [name] Primary Care Provider: [name] Other Clinician: Referring  Provider: Treating Provider/Extender:Mccall Lomax, Tawny Asal in Treatment: 353 Diagnosis Coding ICD-10 Codes Code Description [phone] Non-pressure chronic ulcer of left ankle with fat layer exposed L97.321 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 breakdown of skin Facility Procedures CPT4 Code: [id number] Description: 29580 - APPLY Louretta Parma BOOT/PROFO BILATERAL Modifier: Quantity: 1 Physician Procedures CPT4 Code Description: [id number] 72620 - WC PHYS LEVEL 3 - EST PT ICD-10 Diagnosis Description B55.974 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

## 2019-09-19 ENCOUNTER — Other Ambulatory Visit: Payer: Self-pay

## 2019-09-19 ENCOUNTER — Ambulatory Visit (INDEPENDENT_AMBULATORY_CARE_PROVIDER_SITE_OTHER): Payer: Medicare Other | Admitting: Podiatry

## 2019-09-19 ENCOUNTER — Encounter: Payer: Self-pay | Admitting: Podiatry

## 2019-09-19 ENCOUNTER — Encounter (HOSPITAL_BASED_OUTPATIENT_CLINIC_OR_DEPARTMENT_OTHER): Payer: Medicare Other | Admitting: Internal Medicine

## 2019-09-19 DIAGNOSIS — M21862 Other specified acquired deformities of left lower leg: Secondary | ICD-10-CM | POA: Diagnosis not present

## 2019-09-19 DIAGNOSIS — Z01818 Encounter for other preprocedural examination: Secondary | ICD-10-CM

## 2019-09-19 DIAGNOSIS — M25572 Pain in left ankle and joints of left foot: Secondary | ICD-10-CM

## 2019-09-19 DIAGNOSIS — I739 Peripheral vascular disease, unspecified: Secondary | ICD-10-CM

## 2019-09-19 DIAGNOSIS — M21542 Acquired clubfoot, left foot: Secondary | ICD-10-CM | POA: Diagnosis not present

## 2019-09-19 DIAGNOSIS — L97322 Non-pressure chronic ulcer of left ankle with fat layer exposed: Secondary | ICD-10-CM | POA: Diagnosis not present

## 2019-09-19 NOTE — Progress Notes (Signed)
HPI: 64 y.o. 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  Patient states there is been no change since last visit.  She continues to experience pain and she is unable to ambulate with a functional gait.  Last visit we did discuss surgical intervention which would include tibio-talo-calcaneal arthrodesis.  There was some concern about her venous stripping and history of venous ulcers.  She is currently being treated at the wound care center for a wound to the medial aspect of the left ankle.  The wound is stable and superficial with no great change since last visit.  She presents today for possible surgical consultation  Past Medical History:  Diagnosis Date  . Allergy   . Ankle wound 02/2016   bilateral  . Dental bridge present    lower  . Dental crowns present    upper right  . Open wounds involving multiple regions of lower extremity   . Peripheral vascular disease (HCC)    venous stasis ulcers bilateral lower extremities  . PONV (postoperative nausea and vomiting)      Physical Exam: General: The patient is alert and oriented x3 in no acute distress.  Dermatology: Skin is warm, dry and supple bilateral lower extremities.  Ulcer noted to the medial aspect of the left ankle approximately 2 cm in diameter.  Vascular: Palpable pedal pulses bilaterally. No edema or erythema noted. Capillary refill within normal limits.  Neurological: Epicritic and protective threshold grossly intact bilaterally.   Musculoskeletal Exam: Equinovarus contracture noted with inversion of the rear foot.  There is also contracture at the ankle joint consistent with an equinus type deformity.  Limited range of motion noted.  Hallux abductovalgus deformity noted right foot with increased intermetatarsal angle greater than 15 degrees and hallux abductus angle greater than 40 degrees.  Assessment: 1.  Cavovarus deformity left 2.  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  Bunion deformity right 4.  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 Patient evaluated. X-Rays reviewed again in detail with the patient.  2.  After another lengthy discussion with the patient I do believe that the patient would benefit from surgical intervention.  All possible complications and details of the procedure were explained.  No guarantees were expressed or implied today.  Possible complications and even including limb loss were explained to the patient.  The patient does have a complicated history of healing wounds with recurrence he to the lower extremity.  The patient understands all of these potential risks and complications. 3.  Authorization was initiated today for surgery.  Should surgery will consist of tibiotalocalcaneal arthrodesis with plating.  Tendo Achilles lengthening left (added to surgical consent) request for Integra lateral TTC fusion plate. *Recommend Integra versus Paragon 28 lateral fusion plate 4.  Continue wound care for the medial ankle.  Regarding ulceration, the wound appears very stable and superficial.  I do not believe this will be detrimental to surgical intervention at this time. 5.  Continue pain management at [facility] 6.  Return to clinic 1 week postop      Edrick Kins, DPM [facility]  Dr. Edrick Kins, DPM    [address]  [place], [place] [address]                Office [phone]  Fax [phone]

## 2019-09-19 NOTE — Patient Instructions (Signed)
Pre-Operative Instructions  Congratulations, you have decided to take an important step towards improving your quality of life.  You can be assured that the doctors and staff at [facility] will be with you every step of the way.  Here are some important things you should know:  1. Plan to be at the surgery center/hospital at least 1 (one) hour prior to your scheduled time, unless otherwise directed by the surgical center/hospital staff.  You must have a responsible adult accompany you, remain during the surgery and drive you home.  Make sure you have directions to the surgical center/hospital to ensure you arrive on time. 2. If you are having surgery at [facility] or [facility] hospitals, you will need a copy of your medical history and physical form from your family physician within one month prior to the date of surgery. We will give you a form for your primary physician to complete.  3. We make every effort to accommodate the date you request for surgery.  However, there are times where surgery dates or times have to be moved.  We will contact you as soon as possible if a change in schedule is required.   4. No aspirin/ibuprofen for one week before surgery.  If you are on aspirin, any non-steroidal anti-inflammatory medications (Mobic, Aleve, Ibuprofen) should not be taken seven (7) days prior to your surgery.  You make take Tylenol for pain prior to surgery.  5. Medications - If you are taking daily heart and blood pressure medications, seizure, reflux, allergy, asthma, anxiety, pain or diabetes medications, make sure you notify the surgery center/hospital before the day of surgery so they can tell you which medications you should take or avoid the day of surgery. 6. No food or drink after midnight the night before surgery unless directed otherwise by surgical center/hospital staff. 7. No alcoholic beverages 24-hours prior to surgery.  No smoking 24-hours prior or 24-hours after  surgery. 8. Wear loose pants or shorts. They should be loose enough to fit over bandages, boots, and casts. 9. Don't wear slip-on shoes. Sneakers are preferred. 10. Bring your boot with you to the surgery center/hospital.  Also bring crutches or a walker if your physician has prescribed it for you.  If you do not have this equipment, it will be provided for you after surgery. 11. If you have not been contacted by the surgery center/hospital by the day before your surgery, call to confirm the date and time of your surgery. 12. Leave-time from work may vary depending on the type of surgery you have.  Appropriate arrangements should be made prior to surgery with your employer. 13. Prescriptions will be provided immediately following surgery by your doctor.  Fill these as soon as possible after surgery and take the medication as directed. Pain medications will not be refilled on weekends and must be approved by the doctor. 14. Remove nail polish on the operative foot and avoid getting pedicures prior to surgery. 15. Wash the night before surgery.  The night before surgery wash the foot and leg well with water and the antibacterial soap provided. Be sure to pay special attention to beneath the toenails and in between the toes.  Wash for at least three (3) minutes. Rinse thoroughly with water and dry well with a towel.  Perform this wash unless told not to do so by your physician.  Enclosed: 1 Ice pack (please put in freezer the night before surgery)   1 Hibiclens skin cleaner     Pre-op instructions  If you have any questions regarding the instructions, please do not hesitate to call our office.  [place]: [address], [place], [place] [address] -- [phone]  [place]: [address] [place], [place] [address] -- [phone]  [place]: [address], [place], [place] [address] -- [phone]   Website: [email or web address] 

## 2019-09-20 NOTE — Progress Notes (Signed)
[name], [name] ([id number]) Visit Report for 09/19/2019 SuperBill Details Patient Name: Date of Service: [name], [name] 09/19/2019 Medical Record [id number] Patient Account Number: [id number] Date of Birth/Sex: Treating RN: Mar 30, 1956 (64 y.o. [name] Primary Care Provider: [name] Other Clinician: Referring Provider: Treating Provider/Extender:Angles Trevizo, Tawny Asal in Treatment: 353 Diagnosis Coding ICD-10 Codes Code Description [phone] Non-pressure chronic ulcer of left ankle with fat layer exposed L97.321 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 breakdown of skin Facility Procedures CPT4 Code Description Modifier Quantity [id number] 29580 - APPLY UNNA BOOT/PROFO BILATERAL 1 Electronic Signature(s) Signed: 09/19/2019 5:34:04 PM By: Linton Ham MD Signed: 09/20/2019 5:39:56 PM By: [name] RN Entered By: [name] on 09/19/2019 15:08:07

## 2019-09-20 NOTE — Progress Notes (Signed)
[name], [name] ([id number]) Visit Report for 09/16/2019 Arrival Information Details Patient Name: Date of Service: [name], [name] 09/16/2019 9:30 AM Medical Record [id number] Patient Account Number: [id number] Date of Birth/Sex: Treating RN: [birth year] (64 y.o. F) Dwiggins, Larene Beach Primary Care Ella Guillotte: [name] Other Clinician: Referring Will Schier: Treating Caron Ode/Extender:Robson, Tawny Asal in Treatment: 197 Visit Information History Since Last Visit Cane Added or deleted any medications: No Patient Arrived: 09:36 Any new allergies or adverse reactions: No Arrival Time: Had a fall or experienced change in No Accompanied By: self None activities of daily living that may affect Transfer Assistance: risk of falls: Patient Identification Verified: Yes Signs or symptoms of abuse/neglect since last No Secondary Verification Process Completed: Yes visito Patient Requires Transmission-Based No Hospitalized since last visit: No Precautions: Implantable device outside of the clinic excluding No Patient Has Alerts: No cellular tissue based products placed in the center since last visit: Has Dressing in Place as Prescribed: Yes Pain Present Now: Yes Electronic Signature(s) Signed: 09/20/2019 1:29:28 PM By: [name] Entered By: [name] on 09/16/2019 09:37:01 -------------------------------------------------------------------------------- Compression Therapy Details Patient Name: Date of Service: [name], [name] 09/16/2019 9:30 AM Medical Record [id number] Patient Account Number: [id number] Date of Birth/Sex: Treating RN: 05-21-1956 (64 y.o. [name] Primary Care Tevis Conger: [name] Other Clinician: Referring Seth Friedlander: Treating Vola Beneke/Extender:Robson, Tawny Asal in Treatment: 353 Compression Therapy Performed for Wound Wound #12 Left,Proximal,Anterior Lower Leg Assessment: Performed  By: Clinician Deon Pilling, RN Compression Type: Rolena Infante Post Procedure Diagnosis Same as Pre-procedure Electronic Signature(s) Signed: 09/16/2019 4:21:34 PM By: Kela Millin Entered By: Kela Millin on 09/16/2019 10:12:09 -------------------------------------------------------------------------------- Compression Therapy Details Patient Name: Date of Service: [name], [name] 09/16/2019 9:30 AM Medical Record [id number] Patient Account Number: [id number] Date of Birth/Sex: Treating RN: [birth year] (64 y.o. [name] Primary Care Kaitland Lewellyn: [name] Other Clinician: Referring Granite Godman: Treating Roxy Mastandrea/Extender:Robson, Tawny Asal in Treatment: 353 Compression Therapy Performed for Wound Wound #13 Left,Distal,Anterior Lower Leg Assessment: Performed By: Clinician Deon Pilling, RN Compression Type: Rolena Infante Post Procedure Diagnosis Same as Pre-procedure Electronic Signature(s) Signed: 09/16/2019 4:21:34 PM By: Kela Millin Entered By: Kela Millin on 09/16/2019 10:12:09 -------------------------------------------------------------------------------- Compression Therapy Details Patient Name: Date of Service: [name], [name] 09/16/2019 9:30 AM Medical Record [id number] Patient Account Number: [id number] Date of Birth/Sex: Treating RN: 07-28-1955 (64 y.o. [name] Primary Care Zahirah Cheslock: [name] Other Clinician: Referring Jayd Cadieux: Treating Takiesha Mcdevitt/Extender:Robson, Tawny Asal in Treatment: 353 Compression Therapy Performed for Wound Wound #3 Left,Medial Malleolus Assessment: Performed By: Clinician Deon Pilling, RN Compression Type: Rolena Infante Post Procedure Diagnosis Same as Pre-procedure Electronic Signature(s) Signed: 09/16/2019 4:21:34 PM By: Kela Millin Entered By: Kela Millin on 09/16/2019  10:12:09 -------------------------------------------------------------------------------- Encounter Discharge Information Details Patient Name: Date of Service: [name] 09/16/2019 9:30 AM Medical Record [id number] Patient Account Number: [id number] Date of Birth/Sex: Treating RN: [birth year] (64 y.o. [name] Primary Care Marisa Hufstetler: [name] Other Clinician: Referring Willma Obando: Treating Makaylah Oddo/Extender:Robson, Tawny Asal in Treatment: 353 Encounter Discharge Information Items Discharge Condition: Stable Ambulatory Status: Cane Discharge Destination: Home Transportation: Private Auto Accompanied By: self Schedule Follow-up Appointment: Yes Clinical Summary of Care: Electronic Signature(s) Signed: 09/16/2019 4:06:47 PM By: Deon Pilling Entered By: Deon Pilling on 09/16/2019 10:23:56 -------------------------------------------------------------------------------- Lower Extremity Assessment Details Patient Name: Date of Service: [name], [name] 09/16/2019 9:30 AM Medical Record [id number] Patient Account Number: [id number] Date of Birth/Sex: Treating RN: 07/08/1955 (63 y.o. [name] Primary Care Jissell Trafton:  Redmon, Barth Kirks Other Clinician: Referring Elizah Mierzwa: Treating Daegon Deiss/Extender:Robson, Tawny Asal in Treatment: 353 Edema Assessment Assessed: [Left: No] [Right: No] Edema: [Left: Ye] [Right: s] Calf Left: Right: Point of Measurement: 33 cm From Medial Instep 27.5 cm cm Ankle Left: Right: Point of Measurement: 12 cm From Medial Instep 21.5 cm cm Vascular Assessment Pulses: Dorsalis Pedis Palpable: [Left:Yes] Electronic Signature(s) Signed: 09/16/2019 4:21:34 PM By: Kela Millin Entered By: Kela Millin on 09/16/2019 09:57:53 -------------------------------------------------------------------------------- Multi Wound Chart Details Patient Name: Date of Service: [name] 09/16/2019 9:30 AM Medical Record [id number] Patient Account Number: [id number] Date of Birth/Sex: Treating RN: March 25, 1956 (64 y.o. [name] Primary Care Taher Vannote: [name] Other Clinician: Referring Tayshon Winker: Treating Shadaya Marschner/Extender:Robson, Velva Harman, Essie Christine in Treatment: 353 Vital Signs Height(in): 68 Pulse(bpm): 60 Weight(lbs): 127 Blood Pressure(mmHg): 122/61 Body Mass Index(BMI): 19 Temperature(F): 97.8 Respiratory 18 Rate(breaths/min): Photos: [12:No Photos] [13:No Photos] Wound Location: [12:Left, Proximal, Anterior Lower Leg] [13:Left, Distal, Anterior Lower Leg] Wounding Event: [12:Gradually Appeared] [13:Gradually Appeared] Primary Etiology: [12:Venous Leg Ulcer] [13:Venous Leg Ulcer] Comorbid History: [12:Congestive Heart Failure, Peripheral Vascular Disease, Congestive Heart Failure, End Stage Renal Disease, Tobacco Use, Chronic Obstructive Pulmonary Disease (COPD), Type 1 Diabetes] [13:Congestive Heart Failure, Peripheral Vascular  Disease, Congestive Heart Failure, End Stage Renal Disease, Tobacco Use, Chronic Obstructive Pulmonary Disease (COPD), Type 1 Diabetes] Date Acquired: [12:09/09/2019] [13:09/09/2019] Weeks of Treatment: [12:1] [13:1] Wound Status: [12:Open] [13:Open] Measurements L x W x D 0.6x0.6x0.1 [13:0.3x0.3x0.1] (cm) Area (cm) : [12:0.283] [13:0.071] Volume (cm) : [12:0.028] [13:0.007] % Reduction in Area: [12:78.50%] [13:71.70%] % Reduction in Volume: [12:78.80%] [13:72.00%] Classification: [12:Full Thickness Without Exposed Support Structures Exposed Support Structures Exposed Support Structures] [13:Full Thickness Without] Exudate Amount: [12:Medium] [13:Medium] Exudate Type: [12:Serosanguineous] [13:Serosanguineous] Exudate Color: [12:red, brown] [13:red, brown] Wound Margin: [12:Distinct, outline attached Distinct, outline attached Flat and Intact] Granulation Amount: [12:Large (67-100%)] [13:Large  (67-100%)] Granulation Quality: [12:Red, Pink] [13:Red, Pink] Necrotic Amount: [12:Small (1-33%)] [13:Small (1-33%)] Exposed Structures: [12:Fat Layer (Subcutaneous Fat Layer (Subcutaneous Fat Layer (Subcutaneous Tissue) Exposed: Yes Fascia: No Tendon: No Muscle: No Joint: No Bone: No] [13:Tissue) Exposed: Yes Fascia: No Tendon: No Muscle: No Joint: No Bone: No] Epithelialization: [12:Small (1-33%) Compression Therapy] [13:Small (1-33%) Compression Therapy] Treatment Notes Electronic Signature(s) Signed: 09/16/2019 4:21:34 PM By: Kela Millin Signed: 09/17/2019 6:55:41 AM By: Linton Ham MD Entered By: Linton Ham on 09/16/2019 10:12:16 -------------------------------------------------------------------------------- Multi-Disciplinary Care Plan Details Patient Name: Date of Service: [name] 09/16/2019 9:30 AM Medical Record [id number] Patient Account Number: [id number] Date of Birth/Sex: Treating RN: 1955/09/14 (64 y.o. [name] Primary Care Charie Pinkus: [name] Other Clinician: Referring Saul Dorsi: Treating Farid Grigorian/Extender:Robson, Velva Harman, Essie Christine in Treatment: 21 Active Inactive Abuse / Safety / Falls / Self Care Management Nursing Diagnoses: History of Falls Potential for falls Goals: Patient will not experience any injury related to falls Date Initiated: 08/05/2019 Target Resolution Date: 10/07/2019 Goal Status: Active Patient/caregiver will verbalize/demonstrate measures taken to prevent injury and/or falls Date Initiated: 08/05/2019 Target Resolution Date: 10/07/2019 Goal Status: Active Interventions: Assess fall risk on admission and as needed Assess impairment of mobility on admission and as needed per policy Notes: Venous Leg Ulcer Nursing Diagnoses: Actual venous Insuffiency (use after diagnosis is confirmed) Goals: Patient will maintain optimal edema control Date Initiated: 09/04/2017 Target Resolution Date:  10/07/2019 Goal Status: Active Patient/caregiver will verbalize understanding of disease process and disease management Date Initiated: 08/03/2014 Date Inactivated: 08/28/2017 Target Resolution Date: 08/29/2017 Goal Status: Met Interventions: Compression as ordered Treatment Activities: Therapeutic  compression applied : 09/21/2015 Notes: Wound/Skin Impairment Nursing Diagnoses: Impaired tissue integrity Knowledge deficit related to ulceration/compromised skin integrity Goals: Patient will have a decrease in wound volume by X% from date: (specify in notes) Date Initiated: 04/05/2015 Date Inactivated: 02/12/2017 Target Resolution Date: 09/01/2016 Unmet Reason: chronic, Goal Status: Unmet unable to tolerate stronger compression Patient/caregiver will verbalize understanding of skin care regimen Date Initiated: 04/05/2015 Target Resolution Date: 10/14/2019 Goal Status: Active Interventions: Assess patient/caregiver ability to obtain necessary supplies Assess patient/caregiver ability to perform ulcer/skin care regimen upon admission and as needed Assess ulceration(s) every visit Provide education on ulcer and skin care Notes: Electronic Signature(s) Signed: 09/16/2019 4:21:34 PM By: Kela Millin Entered By: Kela Millin on 09/16/2019 09:43:01 -------------------------------------------------------------------------------- Pain Assessment Details Patient Name: Date of Service: [name], [name] 09/16/2019 9:30 AM Medical Record [id number] Patient Account Number: [id number] Date of Birth/Sex: Treating RN: [birth year] (64 y.o. [name] Primary Care Larance Ratledge: [name] Other Clinician: Referring Fantasia Jinkins: Treating Aundrey Elahi/Extender:Robson, Tawny Asal in Treatment: 353 Active Problems Location of Pain Severity and Description of Pain Patient Has Paino Yes Site Locations Rate the pain. Current Pain Level: 6 Pain Management and  Medication Current Pain Management: Electronic Signature(s) Signed: 09/16/2019 4:21:34 PM By: Kela Millin Signed: 09/20/2019 1:29:28 PM By: [name] Entered By: [name] on 09/16/2019 09:39:41 -------------------------------------------------------------------------------- Patient/Caregiver Education Details Patient Name: Date of Service: [name] 4/16/2021andnbsp9:30 AM Medical Record [id number] Patient Account Number: [id number] Date of Birth/Gender: Treating RN: 10/28/1955 (64 y.o. [name] Primary Care Physician: [name] Other Clinician: Referring Physician: Treating Physician/Extender:Robson, Tawny Asal in Treatment: 353 Education Assessment Education Provided To: Patient Education Topics Provided Wound/Skin Impairment: Methods: Explain/Verbal Responses: State content correctly Electronic Signature(s) Signed: 09/16/2019 4:21:34 PM By: Kela Millin Entered By: Kela Millin on 09/16/2019 09:46:15 -------------------------------------------------------------------------------- Wound Assessment Details Patient Name: Date of Service: [name], [name] 09/16/2019 9:30 AM Medical Record [id number] Patient Account Number: [id number] Date of Birth/Sex: Treating RN: Jul 23, 1955 (64 y.o. [name] Primary Care Izik Bingman: [name] Other Clinician: Referring Patrik Turnbaugh: Treating Jaelyn Cloninger/Extender:Robson, Velva Harman, Essie Christine in Treatment: 353 Wound Status Wound Number: 12 Primary Venous Leg Ulcer Etiology: Wound Location: Left, Proximal, Anterior Lower Leg Wound Open Wounding Event: Gradually Appeared Status: Date Acquired: 09/09/2019 Comorbid Congestive Heart Failure, Peripheral Vascular Weeks Of Treatment: 1 History: Disease, Congestive Heart Failure, End Stage Clustered Wound: No Renal Disease, Tobacco Use, Chronic Obstructive Pulmonary Disease (COPD), Type 1  Diabetes Wound Measurements Length: (cm) 0.6 % Reduc Width: (cm) 0.6 % Reduc Depth: (cm) 0.1 Epithel Area: (cm) 0.283 Tunnel Volume: (cm) 0.028 Underm Wound Description Full Thickness Without Exposed Support Foul Od Classification: Structures Slough/ Wound Distinct, outline attached Margin: Exudate Medium Amount: Exudate Serosanguineous Type: Exudate red, brown Color: Wound Bed Granulation Amount: Large (67-100%) Granulation Quality: Red, Pink Fascia Necrotic Amount: Small (1-33%) Fat Lay Necrotic Quality: Adherent Slough Tendon Muscle Joint E Bone Ex or After Cleansing: No Fibrino Yes Exposed Structure Exposed: No er (Subcutaneous Tissue) Exposed: Yes Exposed: No Exposed: No xposed: No posed: No tion in Area: 78.5% tion in Volume: 78.8% ialization: Small (1-33%) ing: No ining: No Electronic Signature(s) Signed: 09/16/2019 4:21:34 PM By: Kela Millin Entered By: Kela Millin on 09/16/2019 09:59:23 -------------------------------------------------------------------------------- Wound Assessment Details Patient Name: Date of Service: [name] 09/16/2019 9:30 AM Medical Record [id number] Patient Account Number: [id number] Date of Birth/Sex: Treating RN: 1955-10-27 (64 y.o. [name] Primary Care Josphine Laffey: [name] Other Clinician: Referring Deondrae Mcgrail: Treating Cashton Hosley/Extender:Robson, Velva Harman,  Noelle Weeks in Treatment: 353 Wound Status Wound Number: 13 Primary Venous Leg Ulcer Etiology: Wound Location: Left, Distal, Anterior Lower Leg Wound Open Wounding Event: Gradually Appeared Status: Date Acquired: 09/09/2019 Comorbid Congestive Heart Failure, Peripheral Vascular Weeks Of Treatment: 1 History: Disease, Congestive Heart Failure, End Stage Clustered Wound: No Renal Disease, Tobacco Use, Chronic Obstructive Pulmonary Disease (COPD), Type 1 Diabetes Photos Wound Measurements Length: (cm) 0.3 %  Reduc Width: (cm) 0.3 % Reduc Depth: (cm) 0.1 Epithel Area: (cm) 0.071 Tunnel Volume: (cm) 0.007 Underm Wound Description Classification: Full Thickness Without Exposed Support Foul Od Structures Classification: Structures Slough/ Wound Distinct, outline attached Margin: Exudate Medium Amount: Exudate Serosanguineous Type: Exudate red, brown Color: Wound Bed Granulation Amount: Large (67-100%) Granulation Quality: Red, Pink Fascia E Necrotic Amount: Small (1-33%) Fat Laye Necrotic Quality: Adherent Slough Tendon E Muscle E Joint Ex Bone Exp or After Cleansing: No Fibrino Yes Exposed Structure xposed: No r (Subcutaneous Tissue) Exposed: Yes xposed: No xposed: No posed: No osed: No tion in Area: 71.7% tion in Volume: 72% ialization: Small (1-33%) ing: No ining: No Electronic Signature(s) Signed: 09/16/2019 4:21:34 PM By: Kela Millin Signed: 09/20/2019 1:29:28 PM By: [name] Entered By: [name] on 09/16/2019 15:13:43 -------------------------------------------------------------------------------- Wound Assessment Details Patient Name: Date of Service: [name] 09/16/2019 9:30 AM Medical Record [id number] Patient Account Number: [id number] Date of Birth/Sex: Treating RN: 1955-08-07 (64 y.o. [name] Primary Care Ronte Parker: [name] Other Clinician: Referring Felisa Zechman: Treating Jesi Jurgens/Extender:Robson, Velva Harman, Essie Christine in Treatment: 353 Wound Status Wound Number: 3 Primary Venous Leg Ulcer Etiology: Wound Location: Left, Medial Malleolus Wound Open Wounding Event: Gradually Appeared Status: Date Acquired: 11/15/2012 Comorbid Congestive Heart Failure, Peripheral Vascular Weeks Of Treatment: 353 History: Disease, Congestive Heart Failure, End Stage Clustered Wound: No Renal Disease, Tobacco Use, Chronic Obstructive Pulmonary Disease (COPD), Type 1 Diabetes Photos Wound  Measurements Length: (cm) 1.4 % Reduct Width: (cm) 0.9 % Reduct Depth: (cm) 0.1 Epitheli Area: (cm) 0.99 Tunneli Volume: (cm) 0.099 Undermi Wound Description Full Thickness Without Exposed Support Foul Od Classification: Structures Slough/ Wound Flat and Intact Margin: Exudate Medium Amount: Exudate Serosanguineous Type: Exudate red, brown Color: Wound Bed Granulation Amount: Large (67-100%) Granulation Quality: Red, Pink Fascia Necrotic Amount: Small (1-33%) Fat Lay Necrotic Quality: Adherent Slough Tendon Muscle Joint E Bone Ex or After Cleansing: No Fibrino Yes Exposed Structure Exposed: No er (Subcutaneous Tissue) Exposed: Yes Exposed: No Exposed: No xposed: No posed: No ion in Area: 68.5% ion in Volume: 84.2% alization: Medium (34-66%) ng: No ning: No Electronic Signature(s) Signed: 09/16/2019 4:21:34 PM By: Kela Millin Signed: 09/20/2019 1:29:28 PM By: [name] Entered By: [name] on 09/16/2019 15:14:33 -------------------------------------------------------------------------------- [place] Details Patient Name: Date of Service: [name] 09/16/2019 9:30 AM Medical Record [id number] Patient Account Number: [id number] Date of Birth/Sex: Treating RN: [birth year] (64 y.o. [name] Primary Care Jakita Dutkiewicz: [name] Other Clinician: Referring Jamear Carbonneau: Treating Darious Rehman/Extender:Robson, Tawny Asal in Treatment: 353 Vital Signs Time Taken: 09:37 Temperature (F): 97.8 Height (in): 68 Pulse (bpm): 60 Weight (lbs): 127 Respiratory Rate (breaths/min): 18 Body Mass Index (BMI): 19.3 Blood Pressure (mmHg): 122/61 Reference Range: 80 - 120 mg / dl Electronic Signature(s) Signed: 09/20/2019 1:29:28 PM By: [name] Entered By: [name] on 09/16/2019 09:39:35

## 2019-09-20 NOTE — Progress Notes (Signed)
[name], [name] ([id number]) Visit Report for 09/19/2019 Arrival Information Details Patient Name: Date of Service: [name], NESTLE 09/19/2019 2:15 PM Medical Record [id number] Patient Account Number: [id number] Date of Birth/Sex: Treating RN: 10-26-1955 (64 y.o. [name] Primary Care Venancio Chenier: [name] Other Clinician: Referring Kalai Baca: Treating Kimmarie Pascale/Extender:Robson, Tawny Asal in Treatment: 462 Visit Information History Since Last Visit Cane All ordered tests and consults were completed: No Patient Arrived: 14:59 Added or deleted any medications: No Arrival Time: Any new allergies or adverse reactions: No Accompanied By: self None Had a fall or experienced change in No Transfer Assistance: activities of daily living that may affect Patient Identification Verified: Yes risk of falls: Secondary Verification Process Completed: Yes Signs or symptoms of abuse/neglect since last No Patient Requires Transmission-Based No visito Precautions: Hospitalized since last visit: No Patient Has Alerts: No Implantable device outside of the clinic excluding No cellular tissue based products placed in the center since last visit: Pain Present Now: No Electronic Signature(s) Signed: 09/20/2019 5:39:56 PM By: [name] RN Entered By: [name] on 09/19/2019 15:00:34 -------------------------------------------------------------------------------- Compression Therapy Details Patient Name: Date of Service: [name], MORAES 09/19/2019 2:15 PM Medical Record [id number] Patient Account Number: [id number] Date of Birth/Sex: Treating RN: 1956-05-02 (64 y.o. [name] Primary Care Analee Montee: [name] Other Clinician: Referring Ritika Hellickson: Treating Jeoffrey Eleazer/Extender:Robson, Tawny Asal in Treatment: 353 Compression Therapy Performed for Wound Wound #3 Left,Medial Malleolus Assessment: Performed By: Jake Church, RN Compression Type: Rolena Infante Electronic Signature(s) Signed: 09/20/2019 5:39:56 PM By: [name] RN Entered By: [name] on 09/19/2019 15:02:50 -------------------------------------------------------------------------------- Compression Therapy Details Patient Name: Date of Service: [name], [name] 09/19/2019 2:15 PM Medical Record [id number] Patient Account Number: [id number] Date of Birth/Sex: Treating RN: [birth year] (64 y.o. [name] Primary Care Genecis Veley: [name] Other Clinician: Referring Adilynne Fitzwater: Treating Isabeau Mccalla/Extender:Robson, Tawny Asal in Treatment: 353 Compression Therapy Performed for Wound Wound #13 Left,Distal,Anterior Lower Leg Assessment: Performed By: Jake Church, RN Compression Type: Rolena Infante Electronic Signature(s) Signed: 09/20/2019 5:39:56 PM By: [name] RN Entered By: [name] on 09/19/2019 15:03:23 -------------------------------------------------------------------------------- Encounter Discharge Information Details Patient Name: Date of Service: [name] 09/19/2019 2:15 PM Medical Record [id number] Patient Account Number: [id number] Date of Birth/Sex: Treating RN: [birth year] (64 y.o. [name] Primary Care Klaus Casteneda: [name] Other Clinician: Referring [place] Doig: Treating Hallie Ertl/Extender:Robson, Tawny Asal in Treatment: 353 Encounter Discharge Information Items Discharge Condition: Stable Ambulatory Status: Cane Discharge Destination: Home Transportation: Private Auto Accompanied By: self Schedule Follow-up Appointment: Yes Clinical Summary of Care: Patient Declined Electronic Signature(s) Signed: 09/20/2019 5:39:56 PM By: [name] RN Entered By: [name] on 09/19/2019 15:07:53 -------------------------------------------------------------------------------- Patient/Caregiver Education Details Patient Name: Date of  Service: [name] 4/19/2021andnbsp2:15 PM Medical Record [id number] Patient Account Number: [id number] Date of Birth/Gender: Treating RN: Aug 15, 1955 (64 y.o. [name] Primary Care Physician: [name] Other Clinician: Referring Physician: Treating Physician/Extender:Robson, Tawny Asal in Treatment: 353 Education Assessment Education Provided To: Patient Education Topics Provided Wound/Skin Impairment: Methods: Explain/Verbal Responses: See progress note Electronic Signature(s) Signed: 09/20/2019 5:39:56 PM By: [name] RN Entered By: [name] on 09/19/2019 15:07:35 -------------------------------------------------------------------------------- Wound Assessment Details Patient Name: Date of Service: [name] 09/19/2019 2:15 PM Medical Record [id number] Patient Account Number: [id number] Date of Birth/Sex: Treating RN: 1955/12/25 (64 y.o. [name] Primary Care Alya Smaltz: [name] Other Clinician: Referring Pranit Owensby: Treating Trenae Brunke/Extender:Robson, Velva Harman, Essie Christine in Treatment: 353 Wound  Status Wound Number: 12 Primary Venous Leg Ulcer Etiology: Wound Location: Left, Proximal, Anterior Lower Leg Wound Open Wounding Event: Gradually Appeared Status: Date Acquired: 09/09/2019 Comorbid Congestive Heart Failure, Peripheral Vascular Weeks Of Treatment: 1 History: Disease, Congestive Heart Failure, End Clustered Wound: No Stage Renal Disease, Tobacco Use, Chronic Obstructive Pulmonary Disease (COPD), Type 1 Diabetes Wound Measurements Length: (cm) 0.6 % Reduct Width: (cm) 0.6 % Reduct Depth: (cm) 0.1 Epitheli Area: (cm) 0.283 Tunneli Volume: (cm) 0.028 Undermi Wound Description Full Thickness Without Exposed Support Foul Od Classification: Structures Slough/ Wound Distinct, outline attached Margin: Exudate Medium Amount: Exudate Serosanguineous Type: Exudate red,  brown Color: Wound Bed Granulation Amount: Large (67-100%) Granulation Quality: Red, Pink Fascia Necrotic Amount: Small (1-33%) Fat Lay Necrotic Quality: Adherent Slough Tendon Muscle Joint E Bone Ex or After Cleansing: No Fibrino Yes Exposed Structure Exposed: No er (Subcutaneous Tissue) Exposed: Yes Exposed: No Exposed: No xposed: No posed: No ion in Area: 78.5% ion in Volume: 78.8% alization: Small (1-33%) ng: No ning: No Treatment Notes Wound #12 (Left, Proximal, Anterior Lower Leg) 1. Cleanse With Wound Cleanser Soap and water 3. Primary Dressing Applied Calcium Alginate Ag 4. Secondary Dressing Dry Gauze 6. Support Layer Applied Kerlix/Coban Product manager) Signed: 09/19/2019 6:02:31 PM By: Levan Hurst RN, BSN Signed: 09/20/2019 5:39:56 PM By: [name] RN Entered By: [name] on 09/19/2019 14:40:30 -------------------------------------------------------------------------------- Wound Assessment Details Patient Name: Date of Service: [name] 09/19/2019 2:15 PM Medical Record [id number] Patient Account Number: [id number] Date of Birth/Sex: Treating RN: May 27, 1956 (64 y.o. [name] Primary Care Yaremi Stahlman: [name] Other Clinician: Referring Jenniferann Stuckert: Treating Lindy Garczynski/Extender:Robson, Tawny Asal in Treatment: 353 Wound Status Wound Number: 13 Primary Venous Leg Ulcer Etiology: Wound Location: Left, Distal, Anterior Lower Leg Wound Open Wounding Event: Gradually Appeared Status: Date Acquired: 09/09/2019 Comorbid Congestive Heart Failure, Peripheral Vascular Weeks Of Treatment: 1 History: Disease, Congestive Heart Failure, End Clustered Wound: No Stage Renal Disease, Tobacco Use, Chronic Obstructive Pulmonary Disease (COPD), Type 1 Diabetes Wound Measurements Length: (cm) 0.3 % Reduct Width: (cm) 0.3 % Reduct Depth: (cm) 0.1 Epitheli Area: (cm) 0.071 Tunneli Volume: (cm)  0.007 Undermi Wound Description Full Thickness Without Exposed Support Foul Od Classification: Structures Slough/ Wound Distinct, outline attached Margin: Exudate Medium Amount: Exudate Serosanguineous Type: Exudate red, brown Color: Wound Bed Granulation Amount: Large (67-100%) Granulation Quality: Red, Pink Fascia E Necrotic Amount: Small (1-33%) Fat Laye Necrotic Quality: Adherent Slough Tendon E Muscle E Joint Ex Bone Exp or After Cleansing: No Fibrino Yes Exposed Structure xposed: No r (Subcutaneous Tissue) Exposed: Yes xposed: No xposed: No posed: No osed: No ion in Area: 71.7% ion in Volume: 72% alization: Small (1-33%) ng: No ning: No Treatment Notes Wound #13 (Left, Distal, Anterior Lower Leg) 1. Cleanse With Wound Cleanser Soap and water 3. Primary Dressing Applied Calcium Alginate Ag 4. Secondary Dressing Dry Gauze 6. Support Layer Applied Kerlix/Coban Product manager) Signed: 09/19/2019 6:02:31 PM By: Levan Hurst RN, BSN Signed: 09/20/2019 5:39:56 PM By: [name] RN Entered By: [name] on 09/19/2019 14:40:50 -------------------------------------------------------------------------------- Wound Assessment Details Patient Name: Date of Service: [name] 09/19/2019 2:15 PM Medical Record [id number] Patient Account Number: [id number] Date of Birth/Sex: Treating RN: 1956/03/24 (64 y.o. [name] Primary Care Joseff Luckman: [name] Other Clinician: Referring Jaycen Vercher: Treating Amantha Sklar/Extender:Robson, Tawny Asal in Treatment: 353 Wound Status Wound Number: 3 Primary Venous Leg Ulcer Etiology: Wound Location: Left, Medial Malleolus Wound Open Wounding Event: Gradually  Appeared Status: Date Acquired: 11/15/2012 Comorbid Congestive Heart Failure, Peripheral Vascular Weeks Of Treatment: 353 History: Disease, Congestive Heart Failure, End Clustered Wound: No Stage Renal  Disease, Tobacco Use, Chronic Obstructive Pulmonary Disease (COPD), Type 1 Diabetes Wound Measurements Length: (cm) 1.4 % Reduct Width: (cm) 0.9 % Reduct Depth: (cm) 0.1 Epitheli Area: (cm) 0.99 Tunneli Volume: (cm) 0.099 Undermi Wound Description Full Thickness Without Exposed Support Foul Od Classification: Structures Slough/ Wound Flat and Intact Margin: Exudate Medium Amount: Exudate Serosanguineous Type: Exudate red, brown Color: Wound Bed Granulation Amount: Large (67-100%) Granulation Quality: Red, Pink Fascia Necrotic Amount: Small (1-33%) Fat Lay Necrotic Quality: Adherent Slough Tendon Muscle Joint E Bone Ex or After Cleansing: No Fibrino Yes Exposed Structure Exposed: No er (Subcutaneous Tissue) Exposed: Yes Exposed: No Exposed: No xposed: No posed: No ion in Area: 68.5% ion in Volume: 84.2% alization: Medium (34-66%) ng: No ning: No Treatment Notes Wound #3 (Left, Medial Malleolus) 1. Cleanse With Wound Cleanser Soap and water 3. Primary Dressing Applied Hydrofera Blue 4. Secondary Dressing Dry Gauze 6. Support Layer Applied Kerlix/Coban [employer] Notes hydrofera classic. Electronic Signature(s) Signed: 09/19/2019 6:02:31 PM By: Levan Hurst RN, BSN Signed: 09/20/2019 5:39:56 PM By: [name] RN Entered By: [name] on 09/19/2019 14:41:36 -------------------------------------------------------------------------------- [place] Details Patient Name: Date of Service: [name] 09/19/2019 2:15 PM Medical Record [id number] Patient Account Number: [id number] Date of Birth/Sex: Treating RN: [birth year] (64 y.o. [name] Primary Care Jesica Goheen: [name] Other Clinician: Referring Merilee Wible: Treating Alayiah Fontes/Extender:Robson, Tawny Asal in Treatment: 353 Vital Signs Time Taken: 14:37 Temperature (F): 98.0 Height (in): 68 Pulse (bpm): 65 Weight (lbs): 127 Respiratory Rate  (breaths/min): 18 Body Mass Index (BMI): 19.3 Blood Pressure (mmHg): 113/50 Reference Range: 80 - 120 mg / dl Electronic Signature(s) Signed: 09/20/2019 1:29:28 PM By: [name] Entered By: [name] on 09/19/2019 14:37:33

## 2019-09-23 ENCOUNTER — Other Ambulatory Visit: Payer: Self-pay

## 2019-09-23 ENCOUNTER — Encounter (HOSPITAL_BASED_OUTPATIENT_CLINIC_OR_DEPARTMENT_OTHER): Payer: Medicare Other | Admitting: Internal Medicine

## 2019-09-23 DIAGNOSIS — L97322 Non-pressure chronic ulcer of left ankle with fat layer exposed: Secondary | ICD-10-CM | POA: Diagnosis not present

## 2019-09-23 NOTE — Progress Notes (Signed)
AZAIAH, MELLO ([id number]) Visit Report for 09/23/2019 SuperBill Details Patient Name: Date of Service: KEINA, MUTCH 09/23/2019 Medical Record [id number] Patient Account Number: [id number] Date of Birth/Sex: Treating RN: 1956-01-21 (64 y.o. 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

## 2019-09-23 NOTE — Progress Notes (Signed)
[name] ([id number]) Visit Report for 09/23/2019 Arrival Information Details Patient Name: Date of Service: [name], [name] 09/23/2019 10:00 AM Medical Record [id number] Patient Account Number: [id number] Date of Birth/Sex: Treating RN: 02-03-1956 (64 y.o. F) Dwiggins, Larene Beach Primary Care Consuella Scurlock: [name] Other Clinician: Referring Juddson Cobern: Treating Deborah Lazcano/Extender:Robson, Tawny Asal in Treatment: 354 Visit Information History Since Last Visit Added or deleted any medications: No Patient Arrived: [name] Any new allergies or adverse reactions: No Arrival Time: 10:03 Had a fall or experienced change in No Accompanied By: self Stretcher activities of daily living that may affect Transfer Assistance: risk of falls: Patient Identification Verified: Yes Signs or symptoms of abuse/neglect since last No Secondary Verification Process Completed: Yes visito Patient Requires Transmission-Based No Hospitalized since last visit: No Precautions: Implantable device outside of the clinic excluding No Patient Has Alerts: No cellular tissue based products placed in the center since last visit: Has Dressing in Place as Prescribed: Yes Has Compression in Place as Prescribed: Yes Pain Present Now: No Electronic Signature(s) Signed: 09/23/2019 5:13:47 PM By: Kela Millin Entered By: Kela Millin on 09/23/2019 10:03:37 -------------------------------------------------------------------------------- Compression Therapy Details Patient Name: Date of Service: [name] 09/23/2019 10:00 AM Medical Record [id number] Patient Account Number: [id number] Date of Birth/Sex: Treating RN: April 14, 1956 (64 y.o. [name] Primary Care Zaniyah Wernette: [name] Other Clinician: Referring Meryl Hubers: Treating Euclid Cassetta/Extender:Robson, Tawny Asal in Treatment: 354 Compression Therapy Performed for Wound Wound #12  Left,Proximal,Anterior Lower Leg Assessment: Performed By: Clinician Kela Millin, RN Compression Type: Rolena Infante Electronic Signature(s) Signed: 09/23/2019 5:13:47 PM By: Kela Millin Entered By: Kela Millin on 09/23/2019 10:19:33 -------------------------------------------------------------------------------- Compression Therapy Details Patient Name: Date of Service: [name], [name] 09/23/2019 10:00 AM Medical Record [id number] Patient Account Number: [id number] Date of Birth/Sex: Treating RN: 08/27/1955 (64 y.o. [name] Primary Care Moriah Loughry: [name] Other Clinician: Referring Azul Coffie: Treating Kyleen Villatoro/Extender:Robson, Tawny Asal in Treatment: 354 Compression Therapy Performed for Wound Wound #13 Left,Distal,Anterior Lower Leg Assessment: Performed By: Clinician Kela Millin, RN Compression Type: Rolena Infante Electronic Signature(s) Signed: 09/23/2019 5:13:47 PM By: Kela Millin Entered By: Kela Millin on 09/23/2019 10:19:33 -------------------------------------------------------------------------------- Compression Therapy Details Patient Name: Date of Service: [name], [name] 09/23/2019 10:00 AM Medical Record [id number] Patient Account Number: [id number] Date of Birth/Sex: Treating RN: June 18, 1955 (64 y.o. [name] Primary Care Alee Gressman: [name] Other Clinician: Referring Adilenne Ashworth: Treating Taija Mathias/Extender:Robson, Tawny Asal in Treatment: 354 Compression Therapy Performed for Wound Wound #3 Left,Medial Malleolus Assessment: Performed By: Clinician Kela Millin, RN Compression Type: Rolena Infante Electronic Signature(s) Signed: 09/23/2019 5:13:47 PM By: Kela Millin Entered By: Kela Millin on 09/23/2019 10:19:33 -------------------------------------------------------------------------------- Encounter Discharge Information Details Patient  Name: Date of Service: [name] 09/23/2019 10:00 AM Medical Record [id number] Patient Account Number: [id number] Date of Birth/Sex: Treating RN: 1956/01/03 (64 y.o. [name] Primary Care Kameisha Malicki: [name] Other Clinician: Referring Royanne Warshaw: Treating Christinea Brizuela/Extender:Robson, Tawny Asal in Treatment: [place] Encounter Discharge Information Items Discharge Condition: Stable Ambulatory Status: Cane Discharge Destination: Home Transportation: Private Auto Accompanied By: self Schedule Follow-up Appointment: Yes Clinical Summary of Care: Patient Declined Electronic Signature(s) Signed: 09/23/2019 5:13:47 PM By: Kela Millin Entered By: Kela Millin on 09/23/2019 10:21:02 -------------------------------------------------------------------------------- Patient/Caregiver Education Details Patient Name: Date of Service: [name], [name] G. 4/23/2021andnbsp10:00 AM Medical Record [id number] Patient Account Number: [id number] Date of Birth/Gender: Treating RN: February 25, 1956 (63 y.o. [name] Primary Care Physician: [name] Other Clinician: Referring Physician: Treating Physician/Extender:Robson, Velva Harman, Barth Kirks  Weeks in Treatment: 354 Education Assessment Education Provided To: Patient Education Topics Provided Wound/Skin Impairment: Methods: Explain/Verbal Responses: State content correctly Electronic Signature(s) Signed: 09/23/2019 5:13:47 PM By: Kela Millin Entered By: Kela Millin on 09/23/2019 10:20:48 -------------------------------------------------------------------------------- Wound Assessment Details Patient Name: Date of Service: [name] 09/23/2019 10:00 AM Medical Record [id number] Patient Account Number: [id number] Date of Birth/Sex: Treating RN: 1955/06/03 (64 y.o. [name] Primary Care Kyon Bentler: [name] Other Clinician: Referring  Maliha Outten: Treating Francoise Chojnowski/Extender:Robson, Velva Harman, Essie Christine in Treatment: 354 Wound Status Wound Number: 12 Primary Venous Leg Ulcer Etiology: Wound Location: Left, Proximal, Anterior Lower Leg Wound Open Wounding Event: Gradually Appeared Status: Date Acquired: 09/09/2019 Comorbid Congestive Heart Failure, Peripheral Vascular Weeks Of Treatment: 2 History: Disease, Congestive Heart Failure, End Clustered Wound: No Stage Renal Disease, Tobacco Use, Chronic Obstructive Pulmonary Disease (COPD), Type 1 Diabetes Wound Measurements Length: (cm) 0.6 % Reduc Width: (cm) 0.6 % Reduc Depth: (cm) 0.1 Epithel Area: (cm) 0.283 Tunnel Volume: (cm) 0.028 Underm Wound Description Classification: Full Thickness Without Exposed Support Structures Wound Distinct, outline attached Margin: Exudate Medium Amount: Exudate Serosanguineous Type: Exudate red, brown Color: Wound Bed Granulation Amount: Large (67-100%) Granulation Quality: Red, Pink Necrotic Amount: Small (1-33%) Necrotic Quality: Adherent Slough Foul Odor After Cleansing: No Slough/Fibrino Yes Exposed Structure Fascia Exposed: No Fat Layer (Subcutaneous Tissue) Exposed: Yes Tendon Exposed: No Muscle Exposed: No Joint Exposed: No Bone Exposed: No tion in Area: 78.5% tion in Volume: 78.8% ialization: Small (1-33%) ing: No ining: No Treatment Notes Wound #12 (Left, Proximal, Anterior Lower Leg) 1. Cleanse With Wound Cleanser Soap and water 2. Periwound Care Barrier cream Moisturizing lotion TCA Cream 3. Primary Dressing Applied Calcium Alginate Ag Hydrofera Blue 4. Secondary Dressing Dry Gauze 6. Support Layer Best boy) Signed: 09/23/2019 5:13:47 PM By: Kela Millin Entered By: Kela Millin on 09/23/2019 10:18:10 -------------------------------------------------------------------------------- Wound Assessment Details Patient Name: Date of  Service: [name], [name] 09/23/2019 10:00 AM Medical Record [id number] Patient Account Number: [id number] Date of Birth/Sex: Treating RN: 12-Jan-1956 (64 y.o. [name] Primary Care Chuong Casebeer: [name] Other Clinician: Referring Skyra Crichlow: Treating Linkyn Gobin/Extender:Robson, Velva Harman, Essie Christine in Treatment: 354 Wound Status Wound Number: 13 Primary Venous Leg Ulcer Etiology: Wound Location: Left, Distal, Anterior Lower Leg Wound Open Wounding Event: Gradually Appeared Status: Date Acquired: 09/09/2019 Comorbid Congestive Heart Failure, Peripheral Vascular Weeks Of Treatment: 2 History: Disease, Congestive Heart Failure, End Clustered Wound: No Stage Renal Disease, Tobacco Use, Chronic Obstructive Pulmonary Disease (COPD), Type 1 Diabetes Wound Measurements Length: (cm) 0.3 % Reduc Width: (cm) 0.3 % Reduc Depth: (cm) 0.1 Epithel Area: (cm) 0.071 Tunnel Volume: (cm) 0.007 Underm Wound Description Full Thickness Without Exposed Support Foul O Classification: Structures Slough Wound Distinct, outline attached Margin: Exudate Medium Amount: Exudate Serosanguineous Type: Exudate red, brown red, brown Color: Wound Bed Granulation Amount: Large (67-100%) Granulation Quality: Red, Pink Fascia Exp Necrotic Amount: Small (1-33%) Fat Layer Necrotic Quality: Adherent Slough Tendon Exp Muscle Exp Joint Expo Bone Expos dor After Cleansing: No /Fibrino Yes Exposed Structure osed: No (Subcutaneous Tissue) Exposed: Yes osed: No osed: No sed: No ed: No tion in Area: 71.7% tion in Volume: 72% ialization: Small (1-33%) ing: No ining: No Treatment Notes Wound #13 (Left, Distal, Anterior Lower Leg) 1. Cleanse With Wound Cleanser Soap and water 2. Periwound Care Barrier cream Moisturizing lotion TCA Cream 3. Primary Dressing Applied Calcium Alginate Ag Hydrofera Blue 4. Secondary Dressing Dry Gauze 6. Support Layer Dance movement psychotherapist  Signature(s) Signed: 09/23/2019 5:13:47 PM By: Kela Millin Entered By: Kela Millin on 09/23/2019 10:18:23 -------------------------------------------------------------------------------- Wound Assessment Details Patient Name: Date of Service: [name], [name] 09/23/2019 10:00 AM Medical Record [id number] Patient Account Number: [id number] Date of Birth/Sex: Treating RN: 10/25/1955 (64 y.o. [name] Primary Care Sammye Staff: [name] Other Clinician: Referring Catherine Oak: Treating Zaki Gertsch/Extender:Robson, Velva Harman, Essie Christine in Treatment: 354 Wound Status Wound Number: 3 Primary Venous Leg Ulcer Etiology: Wound Location: Left, Medial Malleolus Wound Open Wounding Event: Gradually Appeared Status: Date Acquired: 11/15/2012 Comorbid Congestive Heart Failure, Peripheral Vascular Weeks Of Treatment: 354 History: Disease, Congestive Heart Failure, End Clustered Wound: No Stage Renal Disease, Tobacco Use, Chronic Obstructive Pulmonary Disease (COPD), Type 1 Diabetes Wound Measurements Length: (cm) 1.4 % Reduc Width: (cm) 0.9 % Reduc Depth: (cm) 0.1 Epithel Area: (cm) 0.99 Tunnel Volume: (cm) 0.099 Underm Wound Description Full Thickness Without Exposed Support Classification: Structures Wound Flat and Intact Margin: Exudate Medium Amount: Exudate Serosanguineous Type: Exudate red, brown Color: Wound Bed Granulation Amount: Large (67-100%) Granulation Quality: Red, Pink Necrotic Amount: Small (1-33%) Necrotic Quality: Adherent Slough Foul Odor After Cleansing: No Slough/Fibrino Yes Exposed Structure Fascia Exposed: No Fat Layer (Subcutaneous Tissue) Exposed: Yes Tendon Exposed: No Muscle Exposed: No Joint Exposed: No Bone Exposed: No tion in Area: 68.5% tion in Volume: 84.2% ialization: Medium (34-66%) ing: No ining: No Treatment Notes Wound #3 (Left, Medial Malleolus) 1. Cleanse With Wound  Cleanser Soap and water 2. Periwound Care Barrier cream Moisturizing lotion TCA Cream 3. Primary Dressing Applied Calcium Alginate Ag Hydrofera Blue 4. Secondary Dressing Dry Gauze 6. Support Layer Best boy) Signed: 09/23/2019 5:13:47 PM By: Kela Millin Entered By: Kela Millin on 09/23/2019 10:19:01 -------------------------------------------------------------------------------- Vitals Details Patient Name: Date of Service: [name] 09/23/2019 10:00 AM Medical Record [id number] Patient Account Number: [id number] Date of Birth/Sex: Treating RN: [birth year] (64 y.o. [name] Primary Care Terriona Horlacher: [name] Other Clinician: Referring Lathaniel Legate: Treating Shareef Eddinger/Extender:Robson, Tawny Asal in Treatment: 354 Vital Signs Time Taken: 10:00 Temperature (F): 97.6 Height (in): 68 Pulse (bpm): 67 Weight (lbs): 127 Respiratory Rate (breaths/min): 18 Body Mass Index (BMI): 19.3 Blood Pressure (mmHg): 114/56 Reference Range: 80 - 120 mg / dl Electronic Signature(s) Signed: 09/23/2019 5:13:47 PM By: Kela Millin Entered By: Kela Millin on 09/23/2019 10:05:25

## 2019-09-26 ENCOUNTER — Other Ambulatory Visit: Payer: Self-pay

## 2019-09-26 ENCOUNTER — Encounter (HOSPITAL_BASED_OUTPATIENT_CLINIC_OR_DEPARTMENT_OTHER): Payer: Medicare Other | Admitting: Physical Medicine & Rehabilitation

## 2019-09-26 ENCOUNTER — Other Ambulatory Visit: Payer: Self-pay | Admitting: Podiatry

## 2019-09-26 ENCOUNTER — Encounter: Payer: Self-pay | Admitting: Physical Medicine & Rehabilitation

## 2019-09-26 VITALS — BP 109/65 | HR 55 | Temp 98.7°F | Ht 68.0 in | Wt 138.8 lb

## 2019-09-26 DIAGNOSIS — R269 Unspecified abnormalities of gait and mobility: Secondary | ICD-10-CM | POA: Diagnosis not present

## 2019-09-26 DIAGNOSIS — G249 Dystonia, unspecified: Secondary | ICD-10-CM

## 2019-09-26 DIAGNOSIS — R292 Abnormal reflex: Secondary | ICD-10-CM | POA: Diagnosis not present

## 2019-09-26 DIAGNOSIS — M21542 Acquired clubfoot, left foot: Secondary | ICD-10-CM

## 2019-09-26 NOTE — Progress Notes (Addendum)
Subjective:    Patient ID: [name], female    DOB: 08-Jul-1955, 64 y.o.   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    Last clinic visit on 09/12/19.  Since that time,  She went to wound care and Podiatry, noted reviewed, plan for surgical intervention ( fusion with extending heel cord) in ~5 weeks. She would like to be in the hospital for the procedure.  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  Gabapentin causes some heartburn, but some improvement in pain as well. Denies falls.   Pain Inventory Average Pain 5 Pain Right Now 5 My pain is constant and sharp  In the last 24 hours, has pain interfered with the following? General activity 7 Relation with others 7 Enjoyment of life 10 What TIME of day is your pain at its worst? morning, daytime  Sleep (in general) Fair  Pain is worse with: walking and standing Pain improves with: rest, heat/ice and medication Relief from Meds: 5  Mobility walk with assistance use a cane ability to climb steps?  no do you drive?  yes Do you have any goals in this area?  yes  Function disabled: date disabled . retired  Neuro/Psych trouble walking  Prior Studies Any changes since last visit?  no  Physicians involved in your care Any changes since last visit?  no   Family History  Problem Relation Age of Onset  . Hypertension Mother   . Lung cancer Father   . Diabetes Maternal Grandmother   . Diabetes Paternal Grandmother   . Healthy Brother    Social History   Socioeconomic History  . Marital status: Married    Spouse name: Not on file  . Number of children: 0  . Years of education: 73  . Highest education level: Not on file  Occupational History  . Occupation: retired Glass blower/designer  Tobacco Use  . Smoking status: Former Smoker    Packs/day: 0.25    Years: 26.00    Pack  years: 6.50    Types: Cigarettes    Quit date: 04/25/2007    Years since quitting: 12.4  . Smokeless tobacco: Never Used  Substance and Sexual Activity  . Alcohol use: Yes    Alcohol/week: 0.0 standard drinks    Comment: occasionally  . Drug use: No  . Sexual activity: Yes  Other Topics Concern  . Not on file  Social History Narrative   Patient lives with husband in a one story home. Has no children.  Took early retirement due to medical condition.  Formerly worked as a Glass blower/designer.  Education: 12th.     Social Determinants of Health   Financial Resource Strain:   . Difficulty of Paying Living Expenses:   Food Insecurity:   . Worried About Charity fundraiser in the Last Year:   . Arboriculturist in the Last Year:   Transportation Needs:   . Film/video editor (Medical):   Marland Kitchen Lack of Transportation (Non-Medical):   Physical Activity:   . Days of Exercise per Week:   . Minutes of Exercise per Session:   Stress:   . Feeling of Stress :   Social Connections:   . Frequency of Communication with Friends and Family:   . Frequency of Social Gatherings with Friends and Family:   . Attends Religious Services:   .  Active Member of Clubs or Organizations:   . Attends Archivist Meetings:   Marland Kitchen Marital Status:    Past Surgical History:  Procedure Laterality Date  . APPLICATION OF WOUND VAC Bilateral 12/12/2015   Procedure: APPLICATION OF WOUND VAC;  Surgeon: [name], DO;  Location: [facility];  Service: Plastics;  Laterality: Bilateral;  . DRESSING CHANGE UNDER ANESTHESIA Right 02/14/2016   Procedure: DRESSING CHANGE UNDER ANESTHESIA;  Surgeon: [name], DO;  Location: [facility];  Service: Plastics;  Laterality: Right;  . [place]  . I & D EXTREMITY Bilateral 12/12/2015   Procedure: DEBRIDEMENT OF BILATERAL LEG WOUNDS;  Surgeon: [name], DO;  Location: [facility];   Service: Plastics;  Laterality: Bilateral;  . I & D EXTREMITY Bilateral 01/07/2016   Procedure: IRRIGATION AND DEBRIDEMENT OF BILATERAL LEGS;  Surgeon: [name], DO;  Location: [facility];  Service: Plastics;  Laterality: Bilateral;  . PERIPHERAL VASCULAR CATHETERIZATION N/A 10/15/2015   Procedure: Abdominal Aortogram w/Lower Extremity;  Surgeon: Angelia Mould, MD;  Location: [facility];  Service: Cardiovascular;  Laterality: N/A;  . RADIOLOGY WITH ANESTHESIA N/A 02/24/2019   Procedure: MIR CERVICAL WITH AND WITHOUT CONTRAST;  Surgeon: Radiologist, Medication, MD;  Location: [facility];  Service: Radiology;  Laterality: N/A;  . skin graph    . TONSILLECTOMY     as a child  . VEIN LIGATION AND STRIPPING Left late 1990's  . WOUND DEBRIDEMENT Left 02/14/2016   Procedure: SKIN GRAFT SPLIT THICKNESS TO LEFT ANKLE;  Surgeon: [name], DO;  Location: [facility];  Service: Plastics;  Laterality: Left;   Past Medical History:  Diagnosis Date  . Allergy   . Ankle wound 02/2016   bilateral  . Dental bridge present    lower  . Dental crowns present    upper right  . Open wounds involving multiple regions of lower extremity   . Peripheral vascular disease (HCC)    venous stasis ulcers bilateral lower extremities  . PONV (postoperative nausea and vomiting)    BP 109/65   Pulse (!) 55   Temp 98.7 F (37.1 C)   Ht 5\' 8"  (1.727 m)   Wt 138 lb 12.8 oz (63 kg)   SpO2 96%   BMI 21.10 kg/m   Opioid Risk Score:   Fall Risk Score:  `1  Depression screen PHQ 2/9  Depression screen [facility] 2/9 05/18/2017 04/27/2017 04/07/2017  Decreased Interest 0 0 1  Down, Depressed, Hopeless 0 0 1  PHQ - 2 Score 0 0 2  Altered sleeping - - 2  Tired, decreased energy - - 1  Change in appetite - - 1  Feeling bad or failure about yourself  - - 1  Trouble concentrating - - 1  Moving slowly or fidgety/restless - - 1  Suicidal thoughts - - 0  PHQ-9 Score  - - 9  Difficult doing work/chores - - Not difficult at all   Review of Systems  Constitutional: Negative.   HENT: Negative.   Eyes: Negative.   Respiratory: Negative.   Cardiovascular: Negative.   Gastrointestinal: Negative.   Endocrine: Negative.   Genitourinary: Negative.   Musculoskeletal: Positive for gait problem.       Joint deformity  Skin: Negative.   Allergic/Immunologic: Negative.   Hematological: Negative.   Psychiatric/Behavioral: Negative.       Objective:   Physical Exam  Constitutional: NAD. Respiratory: Normal effort.  No stridor. Skin: RLE with coban dressing Psych: Normal mood.  Normal behavior. 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    1. Left equinovarus with gait abnormality  Continue recs per Dr. Alveta Heimlich  Previously Botox - hold off for time being:    Left Med Gastroc:       200   Left Soleus:                 100   Left FHL:                     200  Gabapentin 100 qhs with benefit, using prn  Plan for arthrodesis with heel cord lengthening with Podiatry  Discussed options for wheelchair rental (pt preference) post-procedure with information and references provided

## 2019-09-30 ENCOUNTER — Encounter (HOSPITAL_BASED_OUTPATIENT_CLINIC_OR_DEPARTMENT_OTHER): Payer: Medicare Other | Admitting: Internal Medicine

## 2019-09-30 DIAGNOSIS — L97322 Non-pressure chronic ulcer of left ankle with fat layer exposed: Secondary | ICD-10-CM | POA: Diagnosis not present

## 2019-09-30 NOTE — Progress Notes (Signed)
[name], [name] ([id number]) Visit Report for 09/30/2019 Debridement Details Patient Name: Date of Service: [facility] [name], [name] 09/30/2019 10:00 A M Medical Record Number: [id number] Patient Account Number: [id number] Date of Birth/Sex: Treating RN: 1956-05-06 (64 y.o. [name] Primary Care Provider: [name] Other Clinician: Referring Provider: Treating Provider/Extender: [name] in Treatment: 355 Debridement Performed for Assessment: Wound #3 Left,Medial Malleolus Performed By: Physician [name]., MD Debridement Type: Debridement Severity of Tissue Pre Debridement: Fat layer exposed Level of Consciousness (Pre-procedure): Awake and Alert Pre-procedure Verification/Time Out Yes - 11:00 Taken: Start Time: 11:00 Pain Control: Lidocaine 5% topical ointment T Area Debrided (L x W): otal 0.8 (cm) x 0.9 (cm) = 0.72 (cm) Tissue and other material debrided: Viable, Non-Viable, Slough, Subcutaneous, Slough Level: Skin/Subcutaneous Tissue Debridement Description: Excisional Instrument: Curette Bleeding: Minimum Hemostasis Achieved: Pressure End Time: 11:01 Procedural Pain: 0 Post Procedural Pain: 0 Response to Treatment: Procedure was tolerated well Level of Consciousness (Post- Awake and Alert procedure): Post Debridement Measurements of Total Wound Length: (cm) 0.8 Width: (cm) 0.9 Depth: (cm) 0.1 Volume: (cm) 0.057 Character of Wound/Ulcer Post Debridement: Improved Severity of Tissue Post Debridement: Fat layer exposed Post Procedure Diagnosis Same as Pre-procedure Electronic Signature(s) Signed: 09/30/2019 5:51:03 PM By: Kela Millin Signed: 09/30/2019 6:02:27 PM By: Linton Ham MD Entered By: Linton Ham on 09/30/2019 11:25:23 -------------------------------------------------------------------------------- HPI Details Patient Name: Date of Service: [name], [name] 09/30/2019 10:00 A M Medical  Record Number: [id number] Patient Account Number: [id number] Date of Birth/Sex: Treating RN: [birth year] (64 y.o. [name] Primary Care Provider: [name] Other Clinician: Referring Provider: Treating Provider/Extender: [name] in Treatment: 355 History of Present Illness HPI Description: the remaining wound is over the left medial ankle. Similar wound over the right medial ankle healed largely with use of Apligraf. Most recently we have been using Hydrofera Blue over this wound with considerable improvement. The patient has been extensively worked up in the past for her venous insufficiency and she is not a candidate for antireflux surgery although I have none of the details available currently. 08/24/14; considerable improvement today. About 50% of this wound areas now epithelialized. The base of the wound appears to be healthier granulation.as opposed to last week when she had deteriorated a considerable improvement 08/17/14; unfortunately the wound has regressed somewhat. The areas of epithelialization from the superior aspect are not nearly as healthy as they were last week. The patient thinks her Hydrofera Blue slipped. 09/07/14; unfortunately the area has markedly regressed in the 2 weeks since I've seen this. There is an odor surrounding erythema. The healthy granulation tissue that we had at the base of the wound now is a dusky color. The nurse reports green drainage 09/14/14; the area looks somewhat better than last week. There is less erythema and less drainage. The culture I did did not show any growth. Nevertheless I think it is better to continue the Cipro and doxycycline for a further week. The remaining wound area was debridement. 09/21/14. Wound did not require debridement last week. Still less erythema and less drainage. She can complete her antibiotics. The areas of epithelialization in the superior aspect of the wound do not look as  healthy as they did some weeks ago 10/05/14 continued improvement in the condition of this wound. There is advancing epithelialization. Less aggressive debridement required 10/19/14 continued improvement in the condition and volume of this wound. Less aggressive debridement to the inferior  part of this to remove surface slough and fibrinous eschar 11/02/14 no debridement is required. The surface granulation appears healthy although some of her islands of epithelialization seem to have regressed. No evidence of infection 11/16/14; lites surface debridement done of surface eschar. The wound does not look to be unhealthy. No evidence of infection. Unfortunately the patient has had podiatry issues in the right foot and for some reason has redeveloped small surface ulcerations in the medial right ankle. Her original presentation involved wounds in this area 11/23/14 no debridement. The area on the right ankle has enlarged. The left ankle wound appears stable in terms of the surface although there is periwound inflammation. There has been regression in the amount of new skin 11/30/14 no debridement. Both wound areas appear healthy. There was no evidence of infection. The the new area on the right medial ankle has enlarged although that both the surfaces appear to be stable. 12/07/14; Debridement of the right medial ankle wound. No no debridement was done on the left. 12/14/14 no major change in and now bilateral medial ankle wounds. Both of these are very painful but the no overt evidence of infection. She has had previous venous ablation 12/21/14; patient states that her right medial ankle wound is considerably more painful last week than usual. Her left is also somewhat painful. She could not tolerate debridement. The right medial ankle wound has fibrinous surface eschar 12/28/14 this is a patient with severe bilateral venous insufficiency ulcers. For a considerable period of time we actually had the one on the  right medial ankle healed however this recently opened up again in June. The left medial ankle wound has been a refractory area with some absent flows. We had some success with Hydrofera Blue on this area and it literally closed by 50% however it is recently opened up Foley. Both of these were debridement today of surface eschar. She tolerates this poorly 01/25/15: No change in the status of this. Thick adherent escar. Very poor tolerance of any attempt at debridement. I had healed the right medial malleolus wound for a considerable amount of time and had the left one down to about 50% of the volume although this is totally regressed over the last 48 weeks. Further the right leg has reopened. she is trying to make a appointment with pain and vascular, previous ablations with Dr. Aleda Grana. I do not believe there is an arterial insufficiency issue here 02/01/15 the status of the adherent eschar bilaterally is actually improved. No debridement was done. She did not manage to get vascular studies done 02/08/15 continued debridement of the area was done today. The slough is less adherent and comes off with less pressure. There is no surrounding infection peripheral pulses are intact 02/15/15 selective debridement with a disposable curette. Again the slough is less adherent and comes off with less difficulty. No surrounding infection peripheral pulses are intact. 02/22/15 selective debridement of the right medial ankle wound. Slough comes off with less difficulty. No obvious surrounding infection peripheral pulses are intact I did not debridement the one on the left. Both of these are stable to improved 03/01/15 selective debridement of both wound areas using a curette to. Adherent slough cup soft with less difficulty. No obvious surrounding infection. The patient tells me that 2 days ago she noted a rash above the right leg wrap. She did not have this on her lower legs when she change this over she arrives  with widespread left greater than right almost folliculitis-looking rash  which is extremely pruritic. I don't see anything to culture here. There is no rash on the rest of her body. She feels well systemically. 03/08/15; selective debridement of both wounds using a curette. Base of this does not look unhealthy. She had limegreen drainage coming out of the left leg wound and describes a lot of drainage. The rash on her left leg looks improved to. No cultures were done. 03/22/15; patient was not here last week. Basal wounds does not look healthy and there is no surrounding erythema. No drainage. There is still a rash on the left leg that almost looks vasculitic however it is clearly limited to the top of where the wrap would be. 04/05/15; on the right required a surgical debridement of surface eschar and necrotic subcutaneous tissue. I did not debridement the area on the left. These continue to be large open wounds that are not changing that much. We were successful at one point in healing the area on the right, and at the same time the area on the left was roughly half the size of current measurements. I think a lot of the deterioration has to do with the prolonged time the patient is on her feet at work 04/19/15 I attempted-like surface debridement bilaterally she does not tolerate this. She tells me that she was in allergic care yesterday with extreme pain over her left lateral malleolus/ankle and was told that she has an "sprain" 05/03/15; large bilateral venous insufficiency wounds over the medial malleolus/medial aspect of her ankles. She complains of copious amounts of drainage and his usual large amounts of pain. There is some increasing erythema around the wound on the right extending into the medial aspect of her foot to. historically she came in with these wounds the right one healed and the left one came down to roughly half its current size however the right one is reopened and the left  is expanded. 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 05/10/15 large bilateral venous insufficiency wounds. There is less adherence surface left however the surface culture that I did last week grew pseudomonas therefore bilateral selective debridement score necessary. There is surrounding erythema. The patient describes severe bilateral drainage and a lot of pain in the left ankle. Apparently her podiatrist was were ready to do a cortisone shot 05/17/15; the patient complains of pain and again copious amounts of drainage. 05/24/15; we used Iodo flex last week. Patient notes considerable improvement in wound drainage. Only needed to change this once. 05/31/15; we continued Iodoflex; the base of these large wounds bilaterally is not too bad but there is probably likely a significant bioburden here. I would like to debridement just doesn't tolerate it. 06/06/14 I would like to continue the Iodoflex although she still hasn't managed to obtain supplies. She has bilateral medial malleoli or large wounds which are mostly superficial. Both of them are covered circumferentially with some nonviable fibrinous slough although she tolerates debridement very poorly. She apparently has an appointment for an ablation on the right leg by interventional radiology. 06/14/15; the patient arrives with the wounds and static condition. We attempted a debridement although she does not do well with this secondary to pain. I 07/05/15; wounds are not much smaller however there appears to be a cleaner granulating base. The left has tight fibrinous slough greater than the right. Debridement is tolerated poorly due to pain. Iodoflex is done more for these wounds in  any of the multitude of different dressings I have tried on the left 1 and then subsequently the right. 07/12/15; no change in the condition of this wound. I am able to do an aggressive debridement on the right but not the  left. She simply cannot tolerate it. We have been using Iodoflex which helps somewhat. It is worthwhile remembering that at one point we healed the right medial ankle wound and the left was about 25% of the current circumference. We have suggested returning to vascular surgery for review of possible further ablations for one reason or another she has not been able to do this. 07/26/15 no major change in the condition of either wound on her medial ankle. I did not attempt to debridement of these. She has been aggressively scrubbing these while she is in the shower at home. She has her supply of Iodoflex which seems to have done more for these wounds then anything I have put on recently. 08/09/15 wound area appears larger although not verified by measurements. Using Iodoflex 09/05/2015 -- she was here for avisit today but had significant problems with the wound and I was asked to see her for a physician opinion. I have summarize that this lady has had surgery on her left lower extremity about 10 years ago where the possible veins stripping was done. She has had an opinion from interventional radiology around November 2016 where no further sclerotherapy was ordered. The patient works 12 hours a day and stands on a concrete floor with work boots and is unable to get the proper compression she requires and cannot elevate her limbs appropriately at any given time. She has recently grown Pseudomonas from her wound culture but has not started her ciprofloxacin which was called in for her. 09/13/15 this continues to be a difficult situation for this patient. At one point I had this wound down to a 1.5 x 1.5" wound on her left leg. This is deteriorated and the right leg has reopened. She now has substantial wounds on her medial calcaneus, malleoli and into her lower leg. One on the left has surface eschar but these are far too painful for me to debridement here. She has a vascular surgery appointment next week to  see if anything can be done to help here. I think she has had previous ablations several years ago at [facility]. She has no major edema. She tells me that she did not get product last time [facility]] and went for several days without it. She continues to work in work boots 12 hours a day. She cannot get compression/4-layer under her work boots. 09/20/15 no major change. Periwound edema control was not very good. Her point with pain and vascular is next Wednesday the 25th 09/28/15; the patient is seen vascular surgery and is apparently scheduled for repeat duplex ultrasounds of her bilateral lower legs next week. 10/05/15; the patient was seen by [name] of vascular surgery. He feels that she should have arterial insufficiency excluded as cause/contributed to her nonhealing stage she is therefore booked for an arteriogram. She has apparently monophasic signals in the dorsalis pedis pulses. She also of course has known severe chronic venous insufficiency with previous procedures as noted previously. I had another long discussion with the patient today about her continuing to work 12 hour shifts. I've written her out for 2 months area had concerns about this as her work location is currently undergoing significant turmoil and this may lead to her termination. She is  aware of this however I agree with her that she simply cannot continue to stand for 12 hours multiple days a week with the substantial wound areas she has. 10/19/15; the [name] appointment was largely for an arteriogram which was normal. She does not have an arterial issue. He didn't make a comment about her chronic venous insufficiency for which she has had previous ablations. Presumably it was not felt that anything additional could be done. The patient is now out of work as I prescribed 2 weeks ago. Her wounds look somewhat less aggravated presumably because of this. I felt I would give debridement another try today 10/25/15; no major  change in this patient's wounds. We are struggling to get her product that she can afford into her own home through her insurance. 11/01/15; no major change in the patient's wounds. I have been using silver alginate as the most affordable product. I spoke to Dr. Marla Roe last week with her requested take her to the OR for surgical debridement and placement of ACEL. Dr. Marla Roe told me that she would be willing to do this however [facility] will not cover this, fortunately the patient has [place] healthcare of some variant 11/08/15; no major change in the patient's wounds. She has been completely nonviable surface that this but is in too much pain with any attempted debridement are clinic. I have arranged for her to see Dr. Marla Roe ham of plastic surgery and this appointment is on Monday. I am hopeful that they will take her to the OR for debridement, possible ACEL ultimately possible skin graft 11/22/15 no major change in the patient's wounds over her bilateral medial calcaneus medial malleolus into the lower legs. Surface on these does not look too bad however on the left there is surrounding erythema and tenderness. This may be cellulitis or could him sleepy tinea. 11/29/15; no major changes in the patient's wounds over her bilateral medial malleolus. There is no infection here and I don't think any additional antibiotics are necessary. There is now plan to move forward. She sees Dr. Marla Roe in a week's time for preparation for operative debridement and ACEL placement I believe on 7/12. She then has a follow-up appointment with Dr. Marla Roe on 7/21 12/28/15; the patient returns today having been taken to the [facility] by Dr. Marla Roe 12/12/15 she underwent debridement, intraoperative cultures [which were negative]. She had placement of a wound VAC. Parent really ACEL was not available to be placed. The wound VAC foam apparently adhered to the wound since then she's been using silver  alginate, Xeroform under Ace wraps. She still says there is a lot of drainage and a lot of pain 01/31/16; this is a patient I see monthly. I had referred her to Dr. Marla Roe him of plastic surgery for large wounds on her bilateral medial ankles. She has been to the OR twice once in early July and once in early August. She tells me over the last 3 weeks she has been using the wound VAC with ACEL underneath it. On the right we've simply been using silver alginate. Under Kerlix Coban wraps. 02/28/16; this is a patient I'm currently seeing monthly. She is gone on to have a skin graft over her large venous insufficiency ulcer on the left medial ankle. This was done by Dr. Marla Roe him. The patient is a bit perturbed about why she didn't have one on her right medial ankle wound. She has been using silver alginate to this. 03/06/16; I received a phone  call from her plastic surgery Dr. Marla Roe. She expressed some concern about the viability of the skin graft she did on the left medial ankle wound. Asked me to place Endoform on this. She told me she is not planning to do a subsequent skin graft on the right as the left one did not take very well. I had placed Hydrofera Blue on the right 03/13/16; continue to have a reasonably healthy wound on the right medial ankle. Down to 3 mm in terms of size. There is epithelialization here. The area on the left medial ankle is her skin graft site. I suppose the last week this looks somewhat better. She has an open area inferiorly however in the center there appears to be some viable tissue. There is a lot of surface callus and eschar that will eventually need to come off however none of this looked to be infected. Patient states that the is able to keep the dressing on for several days which is an improvement. 03/20/16 no major change in the circumference of either wound however on the left side the patient was at Dr. Eusebio Friendly office and they did a debridement of  left wound. 50% of the wound seems to be epithelialized. I been using Endoform on the left Hydrofera Blue in the right 03/27/16; she arrives today with her wound is not looking as healthy as they did last week. The area on the right clearly has an adherent surface to this a very similar surface on the left. Unfortunately for this patient this is all too familiar problem. Clearly the Endoform is not working and will need to change that today that has some potential to help this surface. She does not tolerate debridement in this clinic very well. She is changing the dressing wants 04/03/16; patient arrives with the wounds looking somewhat better especially on the right. Dr. Migdalia Dk change the dressing to silver alginate when she saw her on Monday and also sold her some compression socks. The usefulness of the latter is really not clear and woman with severely draining wounds. 04/10/16; the patient is doing a bit of an experiment wearing the compression stockings that Dr. Migdalia Dk provided her to her left leg and the out of legs based dressings that we provided to the right. 05/01/16; the patient is continuing to wear compression stockings Dr. Migdalia Dk provided her on the left that are apparently silver impregnated. She has been using Iodoflex to the right leg wound. Still a moderate amount of drainage, when she leaves here the wraps only last for 4 days. She has to change the stocking on the left leg every night 05/15/16; she is now using compression stockings bilaterally provided by Dr. Marla Roe. She is wearing a nonadherent layer over the wounds so really I don't think there is anything specific being done to this now. She has some reduction on the left wound. The right is stable. I think all healing here is being done without a specific dressing 06/09/16; patient arrives here today with not much change in the wound certainly in diameter to large circular wounds over the medial aspect of her  ankle bilaterally. Under the light of these services are certainly not viable for healing. There is no evidence of surrounding infection. She is wearing compression stockings with some sort of silver impregnation as prescribed by Dr. Marla Roe. She has a follow-up with her tomorrow. 06/30/16; no major change in the size or condition of her wounds. These are still probably covered with a nonviable surface.  She is using only her purchase stockings. She did see Dr. Marla Roe who seemed to want to apply Dakin's solution to this I'm not extreme short what value this would be. I would suggest Iodoflex which she still has at home. 07/28/16; I follow [name] episodically along with Dr. Marla Roe. 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 She has been applying a topical collagen ointment to both wounds with Adaptic. I don't think Dr. Marla Roe is planning to take her back to the OR. 08/19/16; I follow Mrs. Jeneen [name] on a monthly basis along with Dr. Marla Roe of plastic surgery. 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 I been following her for a number of years. At one point I was able to get the right medial malleolus wound to heal and had the left medial malleolus down to about half its current size however and I had to send her to plastic surgery for an operative debridement. Since then things have been stable to slightly improve the area on the right is slightly better one in the left about the same although there is much less adherent surface than I'm used to with this patient. She is using some form of liquid collagen gel that Dr. Marla Roe provided a Kerlix cover with the patient's own pressure stockings. She tells me that she has extreme pain in both ankles and along the lateral aspect of both feet. She has been unable to work for some period of time. She is telling  me she is retiring at the beginning of April. She sees Dr. Doran Durand of orthopedics next week 09/22/16; patient has not seen Dr. Marla Roe since the last time she is here. I'm not really sure what she is using to the wounds other than bits and pieces of think she had left over including most recently Hydrofera Blue. She is using juxtalite stockings. She is having difficulty with her husband's recent illness "stroke". She is having to transport him to various doctors appointments. Dr. Marla Roe left her the option of a repeat debridement with ACEL however she has not been able to get the time to follow-up on this. She continues to have a fair amount of drainage out of these wounds with certainly precludes leaving dressings on all week 10/13/16; patient has not seen Dr. Marla Roe since she was last in our clinic. I'm not really sure what she is doing with the wounds, we did try to get her [facility] and I think she is actually using this most of the time. Because of drainage she states she has to change this every second day although this is an improvement from what she used to do. She went to see Dr. Doran Durand who did not think she had a muscular issue with regards to her feet, he referred her to a neurologist and I think the appointment is sometime in June. I changed her back to Iodoflex which she has used in the past but not recently. 11/03/16; the patient has been using Iodoflex although she ran out of this. Still claims that there is a lot of drainage although the wound does not look like this. No surrounding erythema. She has not been back to see Dr. Marla Roe 11/24/16; the patient has been using Iodoflex again but she ran out of it 2 or 3 days ago. There is no major change in the condition of either one of these wounds in fact they  are larger and covered in a thick adherent surface slough/nonviable tissue especially on the left. She does not tolerate mechanical debridement in our clinic. Going back  to see Dr. Marla Roe of plastic surgery for an operative debridement would seem reasonable. 12/15/16; the patient has not been back to see Dr. Marla Roe. She is been dealing with a series of illnesses and her husband which of monopolized her time. She is been using Sorbact which we largely supplied. She states the drainage is bad enough that it maximum she can go 2-3 days without changing the dressing 01/12/2017 -- the patient has not been back for about 4 weeks and has not seen Dr. Marla Roe not does she have any appointment pending. 01/23/17; patient has not seen Dr. Marla Roe even though I suggested this previously. She is using Santyl that was suggested last week by Dr. Con Memos this Cost her $16 through her insurance which is indeed surprising 02/12/17; continuing Santyl and the patient is changing this daily. A lot of drainage. She has not been back to see plastic surgery she is using an Ace wrap. Our intake nurse suggested wrap around stockings which would make a good reasonable alternative 02/26/17; patient is been using Santyl and changing this daily due to drainage. She has not been to see plastic surgery she uses in April Ace wrap to control the edema. She did obtain extremitease stockings but stated that the edema in her leg was to big for these 03/20/17; patient is using Santyl and Anasept. Surfaces looked better today the area on the right is actually measuring a little smaller. She has states she has a lot of pain in her feet and ankles and is asking for a consult to pain control which I'll try to help her with through our case manager. 04/10/17; the patient arrives with better-looking wound surfaces and is slightly smaller wound on the left she is using a combination of Santyl and Anasept. She has an appointment or at least as started in the pain control center associated with [facility] 05/14/17; this is a patient who I followed for a prolonged period of time. 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 At one point I had this down to a much smaller wound on the left however these reopened and we've never been able to get these to heal. She has been using Santyl and Anasept gel although 2 weeks ago she ran out of the Anasept gel. She has a stable appearance of the wound. She is going to the wound care clinic at [facility]. They wanted do a nerve block/spinal block although she tells me she is reluctant to go forward with that. 05/21/17; this is a patient I have followed for many years. 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 Chronic pain and deformity in her ankles as well. She is been to see plastic surgery as well as orthopedics. Using PolyMem AG most recently/Kerramax/ABDs and 2 layer compression. She has managed to keep this on and she is coming in for a nurse check to change the dressing on Tuesdays, we see her on Fridays 06/05/17; really quite a good looking surface and the area especially on the right medial has contracted in terms of dimensions. Well granulated healthy-looking tissue on both sides. Even with an open curet there is nothing that even feels abnormal here. This is as good as I've seen this in quite some time. We have been using PolyMem AG and bringing her in for  a nurse check 06/12/17; really quite good surface on both of these wounds. The right medial has contracted a bit left is not. We've been using PolyMem and AG and she is coming in for a nurse visit 06/19/17; we have been using PolyMem AG and bringing her in for a nurse check. Dimensions of her wounds are not better but the surfaces looked better bilaterally. She complained of bleeding last night and the left wound and increasing pain bilaterally. She states her wound pain is more neuropathic than just the wounds. There was some suggestion that this was radicular from her pain management doctor in talking to her it is really difficult to sort  this out. 06/26/17; using PolyMem and AG and bringing her in for a nurse check as All of this and reasonably stable condition. Certainly not improved. The dimensions on the lateral part of the right leg look better but not really measuring better. The medial aspect on the left is about the same. 07/03/16; we have been using PolyMen AG and bringing her in for a nurse check to change the dressings as the wounds have drainage which precludes once weekly changing. We are using all secondary absorptive dressings.our intake nurse is brought up the idea of using a wound VAC/snap VAC on the wound to help with the drainage to see if this would result in some contraction. This is not a bad idea. The area on the right medial is actually looking smaller. Both wounds have a reasonable-looking surface. There is no evidence of cellulitis. The edema is well controlled 07/10/17; the patient was denied for a snap VAC by her insurance. The major issue with these wounds continues to be drainage. We are using wicked PolyMem AG and she is coming in for a nurse visit to change this. The wounds are stable to slightly improved. The surface looks vibrant and the area on the right certainly has shrunk in size but very slowly 07/17/17; the patient still has large wounds on her bilateral medial malleoli. Surface of both of these wounds looks better. The dimensions seem to come and go but no consistent improvement. There is no epithelialization. We do not have options for advanced treatment products due to insurance issues. They did not approve of the wound VAC to help control the drainage. More recently we've been using PolyMem and AG wicked to allow drainage through. We have been bringing her in for a nurse visit to change this. We do not have a lot of options for wound care products and the home again due to insurance issues 07/24/17; the patient's wound actually looks somewhat better today. No drainage measurements are smaller still  healthy-looking surface. We used silver collagen under PolyMen started last week. We have been bringing her in for a dressing change 07/31/17; patient's wound surface continued to look better and I think there is visible change in the dimensions of the wound on the right. Rims of epithelialization. We have been using silver collagen under PolyMen and bringing her in for a dressing change. There appears to be less drainage although she is still in need of the dressing change 08/07/17. Patient's wound surface continues to look better on both sides and the area on the right is definitely smaller. We have been using silver collagen and PolyMen. She feels that the drainage has been it has been better. I asked her about her vascular status. She went to see Dr. Aleda Grana at [facility] and had some form of ablation. I don't have  much detail on this. I haven't my notes from 2016 that she was not a candidate for any further ablation but I don't have any more information on this. We had referred her to vein and vascular I don't think she ever went. He does not have a history of PAD although I don't have any information on this either. We don't even have ABIs in our record 08/14/17; we've been using silver collagen and PolyMen cover. And putting the patient and compression. She we are bringing her in as a nurse visit to change this because ofarge amount of drainage. We didn't the ABIs in clinic today since they had been done in many moons 1.2 bilaterally. She has been to see vein and vascular however this was at [facility] and she had ablation although I really don't have any information on this all seemed biking get a report. She is also been operatively debrided by plastic surgery and had a cell placed probably 8-12 months ago. This didn't have a major effect. We've been making some gains with current dressings 08/19/17-She is here in follow-up evaluation for bilateral medial malleoli ulcers. She continues  to tolerate debridement very poorly. We will continue with recently changed topical treatment; if no significant improvement may consider switching to Iodosorb/Iodoflex. 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 She has been using silver collagen and PolyMem. I believe she has been used and tried on Iodoflex before. 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 These have been very refractory over time. We have been using silver collagen and PolyMen. She is coming in once a week for a doctor's and once a week for nurse visits. We are actually making some progress 09/18/17; the patient's wounds are smaller especially on the right medial. She arrives today to upset to consider even washing these off with Anasept which I think is been part of the reason this is been closing. We've been using collagen covered in PolyMen otherwise. It is noted that she has a small area of folliculitis on the right medial calf that. As we are wrapping her legs I'll give her a short course of doxycycline to make sure this doesn't amount to anything. She is a long list of complaints today including imbalance, shortness of breath on exertion, inversion of her left ankle. With regards to the latter complaints she is been to see orthopedics and they offered her a tendon release surgery I believe but wanted her wounds to be closed first. I have recommended she go see her primary physician with regards to everything else. 09/25/17; patient's wounds are about the same size. We have made some progress bilaterally although not in recent weeks. She will not allow me T wash these wounds with Anasept even if she is doing her cell. Wheeze we've been using collagen covered in PolyMen. Last week she had a  small area of folliculitis this is now opened into a small wound. She completed 5 days of trimethoprim sulfamethoxazole 10/02/17; unfortunately the area on her left medial ankle is worse with a larger wound area towards the Achilles. The patient complains of a lot of pain. She will not allow debridement although visually I don't think there is anything to debridement in any case. We have been using silver collagen and PolyMen for  several months now. Initially we are making some progress although I'm not really seeing that today. We will move back to [facility]. His admittedly this is a bit of a repeat however I'm hoping that his situation is different now. The patient tells me she had her leg on the left give out on her yesterday this is process some pain. 10/09/17; the patient is seen twice a week largely because of drainage issues coming out of the chronic medial bimalleolar wounds that are chronic. Last week the dimensions of the one on the left looks a little larger I changed her to [facility]. She comes in today with a history of terrible pain in the bilateral wound areas. She will not allow debridement. She will not even allow a tissue culture. There is no surrounding erythema no no evidence of cellulitis. We have been putting her Kerlix Coban man. She will not allow more aggressive compression as there was a suggestion to put her in 3 layer wraps. 10/16/17; large wounds on her bilateral medial malleoli. These are chronic. Not much change from last week. The surface looks have healthy but absolutely no epithelialization. A lot of pain little less so of drainage. She will not allow debridement or even washing these off in the vigorous fashion with Anasept. 10/23/17; large wounds on her bilateral malleoli which are chronic. Some improvement in terms of size perhaps on the right since last time I saw these. She states that after we increased the 3 layer compression there was some bleeding, when  she came in for a nurse visit she did not want 3 layer compression put back on about our nurse managed to convince her. She has known chronic venous visit issues and I'm hoping to get her to tolerate the 3 layer compression. using Hydrofera Blue 10/30/17; absolutely no change in the condition of either wound although we've had some improvement in dimensions on the right.. Attempted to put her in 3 layer compression she didn't tolerated she is back in 2 layer compression. We've been using Hydrofera Blue We looked over her past records. She had venous reflux studies in November 2016. There was no evidence of deep venous reflux on the right. Superficial vein did not show the greater saphenous vein at think this is been previously ablated the small saphenous vein was within normal limits. The left deep venous system showed no DVT the vessels were positive for deep venous reflux in the posterior tibial veins at the ankle. The greater saphenous vein was surgically absent small saphenous vein was within normal limits. She went to vein and vascular at [facility]. I believe she had an ablation on the left greater saphenous vein. I'll update her reflux studies perhaps ever reviewed by vein and vascular. We've made absolutely no progress in these wounds. Will also try to read and TheraSkins through her insurance 11/06/17; W the patient apparently has a 2 week follow-up with vein and vascular I like him to review the whole issue with regards to her previous vascular workup by Dr. Aleda Grana. We've really made no progress on these wounds in many months. She arrives today with less viable looking surface on the left medial ankle wound. This was apparently looking about the same on Tuesday when she was here for nurse visit. 11/13/17; deep tissue culture I did last time of the left lower leg showed multiple organisms without any predominating. In particular no Staphylococcus or group A strep were isolated. We sent  her for venous reflux  studies. She's had a previous left greater saphenous vein stripping and I think sclerotherapy of the right greater saphenous vein. She didn't really look at the lesser saphenous vein this both wounds are on the medial aspect. She has reflux in the common femoral vein and popliteal vein and an accessory vein on the right and the common femoral vein and popliteal vein on the left. I'm going to have her go to see vein and vascular just the look over things and see if anything else beside aggressive compression is indicated here. We have not been able to make any progress on these wounds in spite of the fact that the surface of the wounds is never look too bad. 11/20/17; no major change in the condition of the wounds. Patient reports a large amount of drainage. She has a lot of complaints of pain although enlisting her today I wonder if some of this at least his neuropathic rather than secondary to her wounds. She has an appointment with vein and vascular on 12/30/17. The refractory nature of these wounds in my mind at least need vein and vascular to look over the wounds the recent reflux studies we did and her history to see if anything further can be done here. I also note her gait is deteriorated quite a bit. 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 She has a bilateral Trendelenburg gait. I wonder if this is neuropathic or perhaps multilevel radicular. 11/27/17; her wounds actually looks slightly better. Healthy-looking granulation tissue a scant amount of epithelialization. [place] healthcare will not pay for [employer]. They will play for tri layer Oasis and Dermagraft. This is not a diabetic ulcer. We'll try for the tri layer Oasis. She still complains of some drainage. She has a vein and vascular appointment on 12/30/17 12/04/17; the wounds visually look quite good. Healthy-looking granulation with some degree of epithelialization. We are still waiting for response to our  request for trial to try layer Oasis. Her appointment with vascular to review venous and arterial issues isn't sold the end of July 7/31. Not allow debridement or even vigorous cleansing of the wound surface. 12/18/17; slightly smaller especially on the right. 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 We've been using Hydrofera Blue. We still are looking into triple layer Oasis through her insurance 01/08/18 on evaluation today patient's wound actually appears to be showing signs of good improvement at this point in time. She has been tolerating the dressing changes without complication. Fortunately there does not appear to be any evidence of infection at this point in time. We have been utilizing silver nitrate which does seem to be of benefit for her which is also good news. Overall I'm very happy with how things seem to be both regards appearance as well as measurement. Patient did see [name] for evaluation on 12/30/17. In his assessment he felt that stripping would not likely add much more than chronic compression to the patient's healing process. His recommendation was to follow-up in three months with [name] if she hasn't healed in order to consider referral back to you and see vascular where she previously was in a trial and was able to get her wound to heal. I'll be see what she feels she when you staying compression and he reiterated this as well. 01/13/18 on evaluation today patient appears to actually be doing very well in regard to her bilateral medial malleolus ulcers. She seems to have tolerated the chemical cauterization with silver  nitrate last week she did have some pain through that evening but fortunately states that I'll be see since it seems to be doing better she is overall pleased with the progress. 01/21/18; really quite a remarkable improvement since I've last seen these wounds. We started using silver nitrate specially on the islands of hyper  granulation which for some reason her around the wound circumference. This is really done quite nicely. Primary dressing Hydrofera Blue under 4 layer compression. She seems to be able to hold out without a nurse rewrap. Follow-up in 1 week 01/28/18; we've continued the hydrofera blue but continued with chemical cauterization to the wound area that we started about a month ago for irregular hyper granulation. She is made almost stunning improvement in the overall wound dimensions. I was not really expecting this degree of improvement in these chronic wounds 02/05/18; we continue with Hydrofera Bluebut of also continued the aggressive chemical cauterization with silver nitrate. We made nice progress with the right greater than left wound. 02/12/18. We continued with Hydrofera Blue after aggressive chemical cauterization with silver nitrate. We appear to be making nice progress with both wound areas 02/19/2018; we continue with [facility] after washing the wounds vigorously with Anasept spray and chemical cauterization with silver nitrate. We are making excellent progress. The area on the right's just about closed 02/26/2018. The area on the left medial ankle had too much necrotic debris today. I used a #5 curette we are able to get most of the soft. I continued with the silver nitrate to the much smaller wound on the right medial ankle she had a new area on her right lower pretibial area which she says was due to a role in her compression 03/05/2018; both wound areas look healthy. Not much change in dimensions from last week. I continue to use silver nitrate and Hydrofera Blue. The patient saw [name] of vein and vascular. He felt she had venous stasis ulcers. He felt based on her previous arteriogram she should have adequate circulation for healing. Also she has deep venous reflux but really no significant correctable superficial venous reflux at this time. He felt we should continue with  conservative management including leg elevation and compression 04/02/2018; since we last saw this woman about a month ago she had a fall apparently suffered a pelvic fracture. I did not look up the x-ray. Nevertheless because of pain she literally was bedbound for 2 weeks and had home health coming out to change the dressing. Somewhat predictably this is resulted in considerable improvement in both wound areas. The right is just about closed on the medial malleolus and the left is about half the size. 04/16/2018; both her wounds continue to go down in size. Using Hydrofera Blue. 05/07/18; both her wounds appeared to be improving especially on the right where it is almost closed. We are using Hydrofera Blue 05/14/2018; slightly worse this week with larger wounds. Surface on the left medial not quite as good. We have been using Hydrofera Blue 05/21/18; again the wounds are slightly larger. Left medial malleolus slightly larger with eschar around the circumference. We have been using Hydrofera Blue undergoing a wraps for a prolonged period of time. This got a lot better when she was more recumbent due to a fall and a back injury. I change the primary dressing the silver alginate today. She did not tolerate a 4 layer compression previously although I may need to bring this up with her next time 05/28/2018; area on the  left medial malleolus again is slightly larger with more drainage. Area on the right is roughly unchanged. She has a small area of folliculitis on the right medial just on the lower calf. This does not look ominous. 06/03/2018 left medial malleolus slightly smaller in a better looking surface. We used silver nitrate on this last time with silver alginate. The area on the right appears slightly smaller 1/10; left medial malleolus slightly smaller. Small open area on the right. We used silver nitrate and silver alginate as of 2 weeks ago. We continue with the wound and compression. These got a  lot better when she was off her feet 1/17; right medial malleolus wound is smaller. The left may be slightly smaller. Both surfaces look somewhat better. 1/24; both wounds are slightly smaller. Using silver alginate under Unna boots 1/31; both wounds appear smaller in fact the area on the right medial is just about closed. Surface eschar. We have been using silver alginate under Unna boots. The patient is less active now spends let much less time on her feet and I think this is contributed to the general improvement in the wound condition 2/7; both wounds appear smaller. I was hopeful the right medial would be closed however there there is still the same small open area. Slight amount of surface eschar on the left the dimensions are smaller there is eschar but the wound edges appear to be free. We have been using silver alginate under Unna boot's 2/14; both wounds once again measure smaller. Circumferential eschar on the left medial. We have been using silver alginate under Unna boots with gradual improvement 2/21; the area on the right medial malleolus has healed. The area on the left is smaller. We have been using silver alginate and Unna boots. We can discharge wrapping the right leg she has 20/30 stockings at home she will need to protect the scar tissue in this area 2/28; the area on the right medial malleolus remains closed the patient has a compression stocking. The area on the left is smaller. We have been using silver alginate and Unna boots. 3/6 the area on the right medial ankle remains closed. Good edema control noted she is using her own compression stocking. The area on the left medial ankle is smaller. We have been managing this with silver alginate and Unna boots which we will continue today. 3/13; the area on the right medial ankle remains closed and I'm declaring it healed today. When necessary the left is about the same still a healthy-looking surface but no major change and wound  area. No evidence of infection and using silver alginate under unna and generally making considerable improvement 3/27 the area on the right medial ankle remains closed the area on the left is about the same as last week. Certainly not any worse we have been using silver alginate under an Unna boot 4/3; the area on the right medial ankle remains closed per the patient. We did not look at this wound. The wound on the left medial ankle is about the same surface looks healthy we have been using silver alginate under an Unna boot 4/10; area on the right medial ankle remains closed per the patient. We did not look at this wound. The wound on the left medial ankle is slightly larger. The patient complains that the [facility] caused burning pain all week. She also told us that she was a lot more active this week. Changed her back to silver alginate 4/17; right medial  ankle still closed per the patient. Left medial ankle is slightly larger. Using silver alginate. She did not tolerate Hydrofera Blue on this area 4/24; right medial ankle remains closed we have not look at this. The left medial ankle continues to get larger today by about a centimeter. We have been using silver alginate under Unna boots. She complains about 4 layer compression as an alternative. She has been up on her feet working on her garden 5/8; right medial ankle remains closed we did not look at this. The left medial ankle has increased in size about 100%. We have been using silver alginate under Unna boots. She noted increased pain this week and was not surprised that the wound is deteriorated 5/15; no major change in SA however much less erythema ( one week of doxy ocellulitis). 5/22-64 year old female returns at 1 week to the clinic for left medial ankle wound for which we have been using silver alginate under 3 layer compression She was placed on DOXY at last visit - the wound is wider at this visit. She is in 3 layer  compression 5/29; change to [facility] last week. I had given her empiric doxycycline 2 weeks ago for a week. She is in 3 layer compression. She complains of a lot of pain and drainage on presentation today. 6/5; using Hydrofera Blue. I gave her doxycycline recently empirically for erythema and pain around the wound. Believe her cultures showed enterococcus which not would not have been well covered by doxycycline nevertheless the wound looks better and I don't feel specifically that the enterococcus needs to be covered. She has a new what looks like a wrap injury on her lateral left ankle. 6/12; she is using Hydrofera Blue. She has a new area on the left anterior lower tibial area. This was a wrap injury last week. 6/19; the patient is using Hydrofera Blue. She arrived with marked inflammation and erythema around the wound and tenderness. 12/01/18 on evaluation today patient appears to be doing a little bit better based on what I'm hearing from the standpoint of lassos evaluation to this as far as the overall appearance of the wound is concerned. Then sometime substandard she typically sees [name]. Nonetheless overall very pleased with the progress that she's made up to this point. No fevers, chills, nausea, or vomiting noted at this time. 7/10; some improvement in the surface area. Aggressively debrided last week apparently. I went ahead with the debridement today although the patient does not tolerate this very well. We have been using Iodoflex. Still a fair amount of drainage 7/17; slightly smaller. Using Iodoflex. 7/24; no change from last week in terms of surface area. We have been using Iodoflex. Surface looks and continues to look somewhat better 7/31; surface area slightly smaller better looking surface. We have been using Iodoflex. This is under Unna boot compression 8/7-Patient presents at 1 week with Unna boot and Iodoflex, wound appears better 8/14-Patient presents at 1 week with  Iodoflex, we use the Unna boot, wound appears to be stable better.Patient is getting Botox treatment for the inversion of the foot for tendon release, Next week 8/21; we are using Iodoflex. Unna boot. The wound is stable in terms of surface area. Under illumination there is some areas of the wound that appear to be either epithelialized or perhaps this is adherent slough at this point I was not really clear. It did not wipe off and I was reluctant to debride this today. 8/28; we are using Iodoflex  in an Haematologist. Seems to be making good improvement. 9/4; using Iodoflex and wound is slightly smaller. 9/18; we are using Iodoflex with topical silver nitrate when she is here. The wound continues to be smaller 10/2; patient missed her appointment last week due to GI issues. She left and Iodoflex based dressing on for 2 weeks. Wound is about the same size about the size of a dime on the left medial lower 10/9 we have been using Iodoflex on the medial left ankle wound. She has a new superficial probable wrap injury on the dorsal left ankle 10/16; we have been using Hydrofera Blue since last week. This is on the left medial ankle 10/23; we have been using Hydrofera Blue since 2 weeks ago. This is on the left medial ankle. Dimensions are better 11/6; using Hydrofera Blue. I think the wound is smaller but still not closed. Left medial ankle 11/13; we have been using Hydrofera Blue. Wound is certainly no smaller this week. Also the surface not as good. This is the remanent of a very large area on her left medial ankle. 11/20; using Sorbact since last week. Wound was about the same in terms of size although I was disappointed about the surface debris 12/11; 3-week follow-up. Patient was on vacation. Wound is measuring slightly larger we have been using Sorbact. 12/18; wound is about the same size however surface looks better last week after debridement. We have been using Sorbact under compression 1/15 wound  is probably twice the size of last time increased in length nonviable surface. We have been using Sorbact. She was running a mild fever and missed her appointment last week 1/22; the wound is come down in size but under illumination still a very adherent debris we have been Hydrofera Blue that I changed her to last week 1/29; dimensions down slightly. We have been using Hydrofera Blue 2/19 dimensions are the same however there is rims of epithelialization under illumination. Therefore more the surface area may be epithelialized 2/26; the patient's wound actually measures smaller. The wound looks healthy. We have been using Hydrofera Blue. I had some thoughts about running Apligraf then I still may do that however this looks so much better this week we will delay that for now 3/5; the wound is small but about the same as last week. We have been using Hydrofera Blue. No debridement is required today. 3/19; the wound is about the size of a dime. Healthy looking wound even under illumination. We have been using Hydrofera Blue. No mechanical debridement is necessary 3/26; not much change from last week although still looks very healthy. We have been using Hydrofera Blue under Unna boots Patient was offered an ankle fusion by podiatry but not until the wound heals with a proceed with this. 4/9; the patient comes in today with her original wound on the medial ankle looking satisfactory however she has some uncontrolled swelling in the middle part of her leg with 2 new open areas superiorly just lateral to the tibia. I think this was probably a wrap issue. She said she felt uncomfortable during the week but did not call in. We have been using Hydrofera Blue 4/16; the wound on the medial ankle is about the same. She has innumerable small areas superior to this across her mid tibia. I think this is probably folliculitis. She is also been working in the yard doing a lot of sweating 4/30; the patient issue on  the upper areas across her mid tibia of  all healed. I think this was excessive yard work if I remember. Her wound on the medial ankle is smaller. Some debris on this we have been using Hydrofera Blue under [employer] Signature(s) Signed: 09/30/2019 6:02:27 PM By: Linton Ham MD Entered By: Linton Ham on 09/30/2019 11:26:07 -------------------------------------------------------------------------------- Physical Exam Details Patient Name: Date of Service: [name], [name] 09/30/2019 10:00 A M Medical Record Number: [id number] Patient Account Number: [id number] Date of Birth/Sex: Treating RN: 28-Feb-1956 (64 y.o. [name] Primary Care Provider: [name] Other Clinician: Referring Provider: Treating Provider/Extender: [name] in Treatment: 355 Constitutional Respirations regular, non-labored and within target range.. Temperature is normal and within the target range for the patient.. Notes Wound exam; left medial malleolus under illumination had a nonviable senescent looking surface. I used a #3 curette to debride this and really look quite healthy post debridement removed subcutaneous tissue hemostasis with direct pressure. This is about the size of a dime now. The last part of a very substantial chronic wound in this area. There is no evidence of infection pedal pulses are palpable Electronic Signature(s) Signed: 09/30/2019 6:02:27 PM By: Linton Ham MD Entered By: Linton Ham on 09/30/2019 11:27:14 -------------------------------------------------------------------------------- Physician Orders Details Patient Name: Date of Service: [name], [name] 09/30/2019 10:00 A M Medical Record Number: [id number] Patient Account Number: [id number] Date of Birth/Sex: Treating RN: 1956/04/19 (64 y.o. [name] Primary Care Provider: [name] Other Clinician: Referring Provider: Treating  Provider/Extender: [name] in Treatment: [phone] Verbal / Phone Orders: No Diagnosis Coding ICD-10 Coding Code Description [phone] Non-pressure chronic ulcer of left ankle with fat layer exposed L97.321 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 breakdown of skin Follow-up Appointments Return Appointment in 1 week. Dressing Change Frequency Wound #3 Left,Medial Malleolus Do not change entire dressing for one week. Skin Barriers/Peri-Wound Care Moisturizing lotion TCA Cream or Ointment - mixed with lotion Wound Cleansing May shower with protection. Primary Wound Dressing Wound #3 Left,Medial Malleolus Hydrofera Blue - classic Secondary Dressing Wound #3 Left,Medial Malleolus Dry Gauze Edema Control Unna Boot to Left Lower Extremity Avoid standing for long periods of time Elevate legs to the level of the heart or above for 30 minutes daily and/or when sitting, a frequency of: - throughout the day Support Garment 20-30 mm/Hg pressure to: - compression stocking right leg daily Electronic Signature(s) Signed: 09/30/2019 5:50:39 PM By: Levan Hurst RN, BSN Signed: 09/30/2019 6:02:27 PM By: Linton Ham MD Entered By: Levan Hurst on 09/30/2019 11:02:09 -------------------------------------------------------------------------------- Problem List Details Patient Name: Date of Service: [name], [name] 09/30/2019 10:00 A M Medical Record Number: [id number] Patient Account Number: [id number] Date of Birth/Sex: Treating RN: Oct 05, 1955 (64 y.o. 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 08/03/2014 08/03/2014 L03.116 Cellulitis of left lower limb 09/07/2014 09/07/2014 X64.680 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 and inflammation 11/16/2014 11/16/2014 Resolved Problems ICD-10 Code Description Active Date Resolved Date L97.312 Non-pressure chronic ulcer of right  ankle with fat layer exposed 04/10/2016 04/10/2016 Electronic Signature(s) Signed: 09/30/2019 6:02:27 PM By: Linton Ham MD Entered By: Linton Ham on 09/30/2019 11:23:38 -------------------------------------------------------------------------------- Progress Note Details Patient Name: Date of Service: [name], [name] 09/30/2019 10:00 A M Medical Record Number: [id number] Patient Account Number: [id number] Date of Birth/Sex: Treating RN: [birth year] (64 y.o. [name] Primary Care Provider: [name] Other Clinician: Referring Provider: Treating Provider/Extender: [name] in Treatment: 355 Subjective History of Present Illness (HPI) the remaining wound is over the left medial ankle. Similar wound over the right medial ankle healed largely with use of Apligraf. Most recently we have been using Hydrofera Blue over this wound with considerable improvement. The patient has been extensively worked up in the past for her venous insufficiency and she is not a candidate for antireflux surgery although I have none of the details available currently. 08/24/14; considerable improvement today. About 50% of this wound areas now epithelialized. The base of the wound appears to be healthier granulation.as opposed to last week when she had deteriorated a considerable improvement 08/17/14; unfortunately the wound has regressed somewhat. The areas of epithelialization from  the superior aspect are not nearly as healthy as they were last week. The patient thinks her Hydrofera Blue slipped. 09/07/14; unfortunately the area has markedly regressed in the 2 weeks since I've seen this. There is an odor surrounding erythema. The healthy granulation tissue that we had at the base of the wound now is a dusky color. The nurse reports green drainage 09/14/14; the area looks somewhat better than last week. There is less erythema and less drainage. The culture I did did not show any growth. Nevertheless I think it is better to continue the Cipro and doxycycline for a further week. The remaining wound area was debridement. 09/21/14. Wound did not require debridement last week. Still less erythema and less drainage. She can complete her antibiotics. The areas of epithelialization in the superior aspect of the wound do not look as healthy as they did some weeks ago 10/05/14 continued improvement in the condition of this wound. There is advancing epithelialization. Less aggressive debridement required 10/19/14 continued improvement in the condition and volume of this wound. Less aggressive debridement to the inferior part of this to remove surface slough and fibrinous eschar 11/02/14 no debridement is required. The surface granulation appears healthy although some of her islands of epithelialization seem to have regressed. No evidence of infection 11/16/14; lites surface debridement done of surface eschar. The wound does not look to be unhealthy. No evidence of infection. Unfortunately the patient has had podiatry issues in the right foot and for some reason has redeveloped small surface ulcerations in the medial right ankle. Her original presentation involved wounds in this area 11/23/14 no debridement. The area on the right ankle has enlarged. The left ankle wound appears stable in terms of the surface although there is periwound inflammation. There has been regression in the amount of new  skin 11/30/14 no debridement. Both wound areas appear healthy. There was no evidence of infection. The the new area on the right medial ankle has enlarged although that both the surfaces appear to be stable. 12/07/14; Debridement of the right medial ankle wound. No no debridement was done on the left. 12/14/14 no major change in and now bilateral medial ankle wounds. Both of these are very painful but the no overt evidence of infection. She has had previous venous ablation 12/21/14; patient states that her right medial  ankle wound is considerably more painful last week than usual. Her left is also somewhat painful. She could not tolerate debridement. The right medial ankle wound has fibrinous surface eschar 12/28/14 this is a patient with severe bilateral venous insufficiency ulcers. For a considerable period of time we actually had the one on the right medial ankle healed however this recently opened up again in June. The left medial ankle wound has been a refractory area with some absent flows. We had some success with Hydrofera Blue on this area and it literally closed by 50% however it is recently opened up Foley. Both of these were debridement today of surface eschar. She tolerates this poorly 01/25/15: No change in the status of this. Thick adherent escar. Very poor tolerance of any attempt at debridement. I had healed the right medial malleolus wound for a considerable amount of time and had the left one down to about 50% of the volume although this is totally regressed over the last 48 weeks. Further the right leg has reopened. she is trying to make a appointment with pain and vascular, previous ablations with Dr. Aleda Grana. I do not believe there is an arterial insufficiency issue here 02/01/15 the status of the adherent eschar bilaterally is actually improved. No debridement was done. She did not manage to get vascular studies done 02/08/15 continued debridement of the area was done today. The  slough is less adherent and comes off with less pressure. There is no surrounding infection peripheral pulses are intact 02/15/15 selective debridement with a disposable curette. Again the slough is less adherent and comes off with less difficulty. No surrounding infection peripheral pulses are intact. 02/22/15 selective debridement of the right medial ankle wound. Slough comes off with less difficulty. No obvious surrounding infection peripheral pulses are intact I did not debridement the one on the left. Both of these are stable to improved 03/01/15 selective debridement of both wound areas using a curette to. Adherent slough cup soft with less difficulty. No obvious surrounding infection. The patient tells me that 2 days ago she noted a rash above the right leg wrap. She did not have this on her lower legs when she change this over she arrives with widespread left greater than right almost folliculitis-looking rash which is extremely pruritic. I don't see anything to culture here. There is no rash on the rest of her body. She feels well systemically. 03/08/15; selective debridement of both wounds using a curette. Base of this does not look unhealthy. She had limegreen drainage coming out of the left leg wound and describes a lot of drainage. The rash on her left leg looks improved to. No cultures were done. 03/22/15; patient was not here last week. Basal wounds does not look healthy and there is no surrounding erythema. No drainage. There is still a rash on the left leg that almost looks vasculitic however it is clearly limited to the top of where the wrap would be. 04/05/15; on the right required a surgical debridement of surface eschar and necrotic subcutaneous tissue. I did not debridement the area on the left. These continue to be large open wounds that are not changing that much. We were successful at one point in healing the area on the right, and at the same time the area on the left was roughly  half the size of current measurements. I think a lot of the deterioration has to do with the prolonged time the patient is on her feet at work 04/19/15 I attempted-like  surface debridement bilaterally she does not tolerate this. She tells me that she was in allergic care yesterday with extreme pain over her left lateral malleolus/ankle and was told that she has an "sprain" 05/03/15; large bilateral venous insufficiency wounds over the medial malleolus/medial aspect of her ankles. She complains of copious amounts of drainage and his usual large amounts of pain. There is some increasing erythema around the wound on the right extending into the medial aspect of her foot to. historically she came in with these wounds the right one healed and the left one came down to roughly half its current size however the right one is reopened and the left is expanded. 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 05/10/15 large bilateral venous insufficiency wounds. There is less adherence surface left however the surface culture that I did last week grew pseudomonas therefore bilateral selective debridement score necessary. There is surrounding erythema. The patient describes severe bilateral drainage and a lot of pain in the left ankle. Apparently her podiatrist was were ready to do a cortisone shot 05/17/15; the patient complains of pain and again copious amounts of drainage. 05/24/15; we used Iodo flex last week. Patient notes considerable improvement in wound drainage. Only needed to change this once. 05/31/15; we continued Iodoflex; the base of these large wounds bilaterally is not too bad but there is probably likely a significant bioburden here. I would like to debridement just doesn't tolerate it. 06/06/14 I would like to continue the Iodoflex although she still hasn't managed to obtain supplies. She has bilateral medial malleoli or large wounds which are mostly  superficial. Both of them are covered circumferentially with some nonviable fibrinous slough although she tolerates debridement very poorly. She apparently has an appointment for an ablation on the right leg by interventional radiology. 06/14/15; the patient arrives with the wounds and static condition. We attempted a debridement although she does not do well with this secondary to pain. I 07/05/15; wounds are not much smaller however there appears to be a cleaner granulating base. The left has tight fibrinous slough greater than the right. Debridement is tolerated poorly due to pain. Iodoflex is done more for these wounds in any of the multitude of different dressings I have tried on the left 1 and then subsequently the right. 07/12/15; no change in the condition of this wound. I am able to do an aggressive debridement on the right but not the left. She simply cannot tolerate it. We have been using Iodoflex which helps somewhat. It is worthwhile remembering that at one point we healed the right medial ankle wound and the left was about 25% of the current circumference. We have suggested returning to vascular surgery for review of possible further ablations for one reason or another she has not been able to do this. 07/26/15 no major change in the condition of either wound on her medial ankle. I did not attempt to debridement of these. She has been aggressively scrubbing these while she is in the shower at home. She has her supply of Iodoflex which seems to have done more for these wounds then anything I have put on recently. 08/09/15 wound area appears larger although not verified by measurements. Using Iodoflex 09/05/2015 -- she was here for avisit today but had significant problems with the wound and I was asked to see her for a physician opinion. I have summarize that this lady has had surgery  on her left lower extremity about 10 years ago where the possible veins stripping was done. She has had  an opinion from interventional radiology around November 2016 where no further sclerotherapy was ordered. The patient works 12 hours a day and stands on a concrete floor with work boots and is unable to get the proper compression she requires and cannot elevate her limbs appropriately at any given time. She has recently grown Pseudomonas from her wound culture but has not started her ciprofloxacin which was called in for her. 09/13/15 this continues to be a difficult situation for this patient. At one point I had this wound down to a 1.5 x 1.5" wound on her left leg. This is deteriorated and the right leg has reopened. She now has substantial wounds on her medial calcaneus, malleoli and into her lower leg. One on the left has surface eschar but these are far too painful for me to debridement here. She has a vascular surgery appointment next week to see if anything can be done to help here. I think she has had previous ablations several years ago at [facility]. She has no major edema. She tells me that she did not get product last time [facility]] and went for several days without it. She continues to work in work boots 12 hours a day. She cannot get compression/4-layer under her work boots. 09/20/15 no major change. Periwound edema control was not very good. Her point with pain and vascular is next Wednesday the 25th 09/28/15; the patient is seen vascular surgery and is apparently scheduled for repeat duplex ultrasounds of her bilateral lower legs next week. 10/05/15; the patient was seen by [name] of vascular surgery. He feels that she should have arterial insufficiency excluded as cause/contributed to her nonhealing stage she is therefore booked for an arteriogram. She has apparently monophasic signals in the dorsalis pedis pulses. She also of course has known severe chronic venous insufficiency with previous procedures as noted previously. I had another long discussion with the patient today  about her continuing to work 12 hour shifts. I've written her out for 2 months area had concerns about this as her work location is currently undergoing significant turmoil and this may lead to her termination. She is aware of this however I agree with her that she simply cannot continue to stand for 12 hours multiple days a week with the substantial wound areas she has. 10/19/15; the [name] appointment was largely for an arteriogram which was normal. She does not have an arterial issue. He didn't make a comment about her chronic venous insufficiency for which she has had previous ablations. Presumably it was not felt that anything additional could be done. The patient is now out of work as I prescribed 2 weeks ago. Her wounds look somewhat less aggravated presumably because of this. I felt I would give debridement another try today 10/25/15; no major change in this patient's wounds. We are struggling to get her product that she can afford into her own home through her insurance. 11/01/15; no major change in the patient's wounds. I have been using silver alginate as the most affordable product. I spoke to Dr. Marla Roe last week with her requested take her to the OR for surgical debridement and placement of ACEL. Dr. Marla Roe told me that she would be willing to do this however [facility] will not cover this, fortunately the patient has [place] healthcare of some variant 11/08/15; no major change in the  patient's wounds. She has been completely nonviable surface that this but is in too much pain with any attempted debridement are clinic. I have arranged for her to see Dr. Marla Roe ham of plastic surgery and this appointment is on Monday. I am hopeful that they will take her to the OR for debridement, possible ACEL ultimately possible skin graft 11/22/15 no major change in the patient's wounds over her bilateral medial calcaneus medial malleolus into the lower legs. Surface on these does not  look too bad however on the left there is surrounding erythema and tenderness. This may be cellulitis or could him sleepy tinea. 11/29/15; no major changes in the patient's wounds over her bilateral medial malleolus. There is no infection here and I don't think any additional antibiotics are necessary. There is now plan to move forward. She sees Dr. Marla Roe in a week's time for preparation for operative debridement and ACEL placement I believe on 7/12. She then has a follow-up appointment with Dr. Marla Roe on 7/21 12/28/15; the patient returns today having been taken to the [facility] by Dr. Marla Roe 12/12/15 she underwent debridement, intraoperative cultures [which were negative]. She had placement of a wound VAC. Parent really ACEL was not available to be placed. The wound VAC foam apparently adhered to the wound since then she's been using silver alginate, Xeroform under Ace wraps. She still says there is a lot of drainage and a lot of pain 01/31/16; this is a patient I see monthly. I had referred her to Dr. Marla Roe him of plastic surgery for large wounds on her bilateral medial ankles. She has been to the OR twice once in early July and once in early August. She tells me over the last 3 weeks she has been using the wound VAC with ACEL underneath it. On the right we've simply been using silver alginate. Under Kerlix Coban wraps. 02/28/16; this is a patient I'm currently seeing monthly. She is gone on to have a skin graft over her large venous insufficiency ulcer on the left medial ankle. This was done by Dr. Marla Roe him. The patient is a bit perturbed about why she didn't have one on her right medial ankle wound. She has been using silver alginate to this. 03/06/16; I received a phone call from her plastic surgery Dr. Marla Roe. She expressed some concern about the viability of the skin graft she did on the left medial ankle wound. Asked me to place Endoform on this. She told me she is not planning  to do a subsequent skin graft on the right as the left one did not take very well. I had placed Hydrofera Blue on the right 03/13/16; continue to have a reasonably healthy wound on the right medial ankle. Down to 3 mm in terms of size. There is epithelialization here. The area on the left medial ankle is her skin graft site. I suppose the last week this looks somewhat better. She has an open area inferiorly however in the center there appears to be some viable tissue. There is a lot of surface callus and eschar that will eventually need to come off however none of this looked to be infected. Patient states that the is able to keep the dressing on for several days which is an improvement. 03/20/16 no major change in the circumference of either wound however on the left side the patient was at Dr. Eusebio Friendly office and they did a debridement of left wound. 50% of the wound seems to be epithelialized. I been using  Endoform on the left Hydrofera Blue in the right 03/27/16; she arrives today with her wound is not looking as healthy as they did last week. The area on the right clearly has an adherent surface to this a very similar surface on the left. Unfortunately for this patient this is all too familiar problem. Clearly the Endoform is not working and will need to change that today that has some potential to help this surface. She does not tolerate debridement in this clinic very well. She is changing the dressing wants 04/03/16; patient arrives with the wounds looking somewhat better especially on the right. Dr. Migdalia Dk change the dressing to silver alginate when she saw her on Monday and also sold her some compression socks. The usefulness of the latter is really not clear and woman with severely draining wounds. 04/10/16; the patient is doing a bit of an experiment wearing the compression stockings that Dr. Migdalia Dk provided her to her left leg and the out of legs based dressings that we provided to the  right. 05/01/16; the patient is continuing to wear compression stockings Dr. Migdalia Dk provided her on the left that are apparently silver impregnated. She has been using Iodoflex to the right leg wound. Still a moderate amount of drainage, when she leaves here the wraps only last for 4 days. She has to change the stocking on the left leg every night 05/15/16; she is now using compression stockings bilaterally provided by Dr. Marla Roe. She is wearing a nonadherent layer over the wounds so really I don't think there is anything specific being done to this now. She has some reduction on the left wound. The right is stable. I think all healing here is being done without a specific dressing 06/09/16; patient arrives here today with not much change in the wound certainly in diameter to large circular wounds over the medial aspect of her ankle bilaterally. Under the light of these services are certainly not viable for healing. There is no evidence of surrounding infection. She is wearing compression stockings with some sort of silver impregnation as prescribed by Dr. Marla Roe. She has a follow-up with her tomorrow. 06/30/16; no major change in the size or condition of her wounds. These are still probably covered with a nonviable surface. She is using only her purchase stockings. She did see Dr. Marla Roe who seemed to want to apply Dakin's solution to this I'm not extreme short what value this would be. I would suggest Iodoflex which she still has at home. 07/28/16; I follow Mrs. Roskelley episodically along with Dr. Marla Roe. 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 She has been applying a topical collagen ointment to both wounds with Adaptic. I don't think Dr. Marla Roe is planning to take her back to the OR. 08/19/16; I follow Mrs. Jeneen [name] on a monthly basis along with Dr. Marla Roe of plastic surgery. She has very refractory venous  insufficiency wounds on the bilateral medial lower legs left greater than right. I been following her for a number of years. At one point I was able to get the right medial malleolus wound to heal and had the left medial malleolus down to about half its current size however and I had to send her to plastic surgery for an operative debridement. Since then things have been stable to slightly improve the area on the right is slightly better one in the left about the same although there is much less adherent surface than I'm used  to with this patient. She is using some form of liquid collagen gel that Dr. Marla Roe provided a Kerlix cover with the patient's own pressure stockings. She tells me that she has extreme pain in both ankles and along the lateral aspect of both feet. She has been unable to work for some period of time. She is telling me she is retiring at the beginning of April. She sees Dr. Doran Durand of orthopedics next week 09/22/16; patient has not seen Dr. Marla Roe since the last time she is here. I'm not really sure what she is using to the wounds other than bits and pieces of think she had left over including most recently Hydrofera Blue. She is using juxtalite stockings. She is having difficulty with her husband's recent illness "stroke". She is having to transport him to various doctors appointments. Dr. Marla Roe left her the option of a repeat debridement with ACEL however she has not been able to get the time to follow-up on this. She continues to have a fair amount of drainage out of these wounds with certainly precludes leaving dressings on all week 10/13/16; patient has not seen Dr. Marla Roe since she was last in our clinic. I'm not really sure what she is doing with the wounds, we did try to get her [facility] and I think she is actually using this most of the time. Because of drainage she states she has to change this every second day although this is an improvement from what  she used to do. She went to see Dr. Doran Durand who did not think she had a muscular issue with regards to her feet, he referred her to a neurologist and I think the appointment is sometime in June. I changed her back to Iodoflex which she has used in the past but not recently. 11/03/16; the patient has been using Iodoflex although she ran out of this. Still claims that there is a lot of drainage although the wound does not look like this. No surrounding erythema. She has not been back to see Dr. Marla Roe 11/24/16; the patient has been using Iodoflex again but she ran out of it 2 or 3 days ago. There is no major change in the condition of either one of these wounds in fact they are larger and covered in a thick adherent surface slough/nonviable tissue especially on the left. She does not tolerate mechanical debridement in our clinic. Going back to see Dr. Marla Roe of plastic surgery for an operative debridement would seem reasonable. 12/15/16; the patient has not been back to see Dr. Marla Roe. She is been dealing with a series of illnesses and her husband which of monopolized her time. She is been using Sorbact which we largely supplied. She states the drainage is bad enough that it maximum she can go 2-3 days without changing the dressing 01/12/2017 -- the patient has not been back for about 4 weeks and has not seen Dr. Marla Roe not does she have any appointment pending. 01/23/17; patient has not seen Dr. Marla Roe even though I suggested this previously. She is using Santyl that was suggested last week by Dr. Con Memos this Cost her $16 through her insurance which is indeed surprising 02/12/17; continuing Santyl and the patient is changing this daily. A lot of drainage. She has not been back to see plastic surgery she is using an Ace wrap. Our intake nurse suggested wrap around stockings which would make a good reasonable alternative 02/26/17; patient is been using Santyl and changing this daily due to  drainage. She has not been to see plastic surgery she uses in April Ace wrap to control the edema. She did obtain extremitease stockings but stated that the edema in her leg was to big for these 03/20/17; patient is using Santyl and Anasept. Surfaces looked better today the area on the right is actually measuring a little smaller. She has states she has a lot of pain in her feet and ankles and is asking for a consult to pain control which I'll try to help her with through our case manager. 04/10/17; the patient arrives with better-looking wound surfaces and is slightly smaller wound on the left she is using a combination of Santyl and Anasept. She has an appointment or at least as started in the pain control center associated with [facility] 05/14/17; this is a patient who I followed for a prolonged period of time. 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 At one point I had this down to a much smaller wound on the left however these reopened and we've never been able to get these to heal. She has been using Santyl and Anasept gel although 2 weeks ago she ran out of the Anasept gel. She has a stable appearance of the wound. She is going to the wound care clinic at [facility]. They wanted do a nerve block/spinal block although she tells me she is reluctant to go forward with that. 05/21/17; this is a patient I have followed for many years. 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 Chronic pain and deformity in her ankles as well. She is been to see plastic surgery as well as orthopedics. Using PolyMem AG most recently/Kerramax/ABDs and 2 layer compression. She has managed to keep this on and she is coming in for a nurse check to change the dressing on Tuesdays, we see her on Fridays 06/05/17; really quite a good looking surface and the area especially on the right medial has contracted in terms of dimensions. Well granulated  healthy-looking tissue on both sides. Even with an open curet there is nothing that even feels abnormal here. This is as good as I've seen this in quite some time. We have been using PolyMem AG and bringing her in for a nurse check 06/12/17; really quite good surface on both of these wounds. The right medial has contracted a bit left is not. We've been using PolyMem and AG and she is coming in for a nurse visit 06/19/17; we have been using PolyMem AG and bringing her in for a nurse check. Dimensions of her wounds are not better but the surfaces looked better bilaterally. She complained of bleeding last night and the left wound and increasing pain bilaterally. She states her wound pain is more neuropathic than just the wounds. There was some suggestion that this was radicular from her pain management doctor in talking to her it is really difficult to sort this out. 06/26/17; using PolyMem and AG and bringing her in for a nurse check as All of this and reasonably stable condition. Certainly not improved. The dimensions on the lateral part of the right leg look better but not really measuring better. The medial aspect on the left is about the same. 07/03/16; we have been using PolyMen AG and bringing her in for a nurse check to change the dressings as the wounds have drainage which precludes once weekly changing. We are using all secondary absorptive dressings.our intake nurse is brought up the idea of using  a wound VAC/snap VAC on the wound to help with the drainage to see if this would result in some contraction. This is not a bad idea. The area on the right medial is actually looking smaller. Both wounds have a reasonable-looking surface. There is no evidence of cellulitis. The edema is well controlled 07/10/17; the patient was denied for a snap VAC by her insurance. The major issue with these wounds continues to be drainage. We are using wicked PolyMem AG and she is coming in for a nurse visit to change  this. The wounds are stable to slightly improved. The surface looks vibrant and the area on the right certainly has shrunk in size but very slowly 07/17/17; the patient still has large wounds on her bilateral medial malleoli. Surface of both of these wounds looks better. The dimensions seem to come and go but no consistent improvement. There is no epithelialization. We do not have options for advanced treatment products due to insurance issues. They did not approve of the wound VAC to help control the drainage. More recently we've been using PolyMem and AG wicked to allow drainage through. We have been bringing her in for a nurse visit to change this. We do not have a lot of options for wound care products and the home again due to insurance issues 07/24/17; the patient's wound actually looks somewhat better today. No drainage measurements are smaller still healthy-looking surface. We used silver collagen under PolyMen started last week. We have been bringing her in for a dressing change 07/31/17; patient's wound surface continued to look better and I think there is visible change in the dimensions of the wound on the right. Rims of epithelialization. We have been using silver collagen under PolyMen and bringing her in for a dressing change. There appears to be less drainage although she is still in need of the dressing change 08/07/17. Patient's wound surface continues to look better on both sides and the area on the right is definitely smaller. We have been using silver collagen and PolyMen. She feels that the drainage has been it has been better. I asked her about her vascular status. She went to see Dr. Aleda Grana at [facility] and had some form of ablation. I don't have much detail on this. I haven't my notes from 2016 that she was not a candidate for any further ablation but I don't have any more information on this. We had referred her to vein and vascular I don't think she ever went. He does not  have a history of PAD although I don't have any information on this either. We don't even have ABIs in our record 08/14/17; we've been using silver collagen and PolyMen cover. And putting the patient and compression. She we are bringing her in as a nurse visit to change this because ofarge amount of drainage. We didn't the ABIs in clinic today since they had been done in many moons 1.2 bilaterally. She has been to see vein and vascular however this was at [facility] and she had ablation although I really don't have any information on this all seemed biking get a report. She is also been operatively debrided by plastic surgery and had a cell placed probably 8-12 months ago. This didn't have a major effect. We've been making some gains with current dressings 08/19/17-She is here in follow-up evaluation for bilateral medial malleoli ulcers. She continues to tolerate debridement very poorly. We will continue with recently changed topical treatment; if no significant improvement  may consider switching to Iodosorb/Iodoflex. 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 She has been using silver collagen and PolyMem. I believe she has been used and tried on Iodoflex before. 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 These have been very refractory over time. We have been using silver collagen and PolyMen. She is coming in once a week for a doctor's and once a week for nurse visits. We are actually making some progress 09/18/17; the patient's wounds are smaller especially on the right medial. She arrives today to upset to consider even washing these off with Anasept which I think is been part of the reason this is been closing. We've been using collagen covered in PolyMen  otherwise. It is noted that she has a small area of folliculitis on the right medial calf that. As we are wrapping her legs I'll give her a short course of doxycycline to make sure this doesn't amount to anything. She is a long list of complaints today including imbalance, shortness of breath on exertion, inversion of her left ankle. With regards to the latter complaints she is been to see orthopedics and they offered her a tendon release surgery I believe but wanted her wounds to be closed first. I have recommended she go see her primary physician with regards to everything else. 09/25/17; patient's wounds are about the same size. We have made some progress bilaterally although not in recent weeks. She will not allow me T wash these wounds with Anasept even if she is doing her cell. Wheeze we've been using collagen covered in PolyMen. Last week she had a small area of folliculitis this is now opened into a small wound. She completed 5 days of trimethoprim sulfamethoxazole 10/02/17; unfortunately the area on her left medial ankle is worse with a larger wound area towards the Achilles. The patient complains of a lot of pain. She will not allow debridement although visually I don't think there is anything to debridement in any case. We have been using silver collagen and PolyMen for several months now. Initially we are making some progress although I'm not really seeing that today. We will move back to [facility]. His admittedly this is a bit of a repeat however I'm hoping that his situation is different now. The patient tells me she had her leg on the left give out on her yesterday this is process some pain. 10/09/17; the patient is seen twice a week largely because of drainage issues coming out of the chronic medial bimalleolar wounds that are chronic. Last week the dimensions of the one on the left looks a little larger I changed her to [facility]. She comes in today with a history of terrible  pain in the bilateral wound areas. She will not allow debridement. She will not even allow a tissue culture. There is no surrounding erythema no no evidence of cellulitis. We have been putting her Kerlix Coban man. She will not allow more aggressive compression as there was a suggestion to put her in 3 layer wraps. 10/16/17; large wounds on her bilateral medial malleoli. These are chronic. Not much change from last week. The surface looks have healthy but absolutely no epithelialization. A lot of pain little less so of drainage. She will not allow debridement or even  washing these off in the vigorous fashion with Anasept. 10/23/17; large wounds on her bilateral malleoli which are chronic. Some improvement in terms of size perhaps on the right since last time I saw these. She states that after we increased the 3 layer compression there was some bleeding, when she came in for a nurse visit she did not want 3 layer compression put back on about our nurse managed to convince her. She has known chronic venous visit issues and I'm hoping to get her to tolerate the 3 layer compression. using Hydrofera Blue 10/30/17; absolutely no change in the condition of either wound although we've had some improvement in dimensions on the right.. Attempted to put her in 3 layer compression she didn't tolerated she is back in 2 layer compression. We've been using Hydrofera Blue We looked over her past records. She had venous reflux studies in November 2016. There was no evidence of deep venous reflux on the right. Superficial vein did not show the greater saphenous vein at think this is been previously ablated the small saphenous vein was within normal limits. The left deep venous system showed no DVT the vessels were positive for deep venous reflux in the posterior tibial veins at the ankle. The greater saphenous vein was surgically absent small saphenous vein was within normal limits. She went to vein and vascular at  [facility]. I believe she had an ablation on the left greater saphenous vein. I'll update her reflux studies perhaps ever reviewed by vein and vascular. We've made absolutely no progress in these wounds. Will also try to read and TheraSkins through her insurance 11/06/17; W the patient apparently has a 2 week follow-up with vein and vascular I like him to review the whole issue with regards to her previous vascular workup by Dr. Aleda Grana. We've really made no progress on these wounds in many months. She arrives today with less viable looking surface on the left medial ankle wound. This was apparently looking about the same on Tuesday when she was here for nurse visit. 11/13/17; deep tissue culture I did last time of the left lower leg showed multiple organisms without any predominating. In particular no Staphylococcus or group A strep were isolated. We sent her for venous reflux studies. She's had a previous left greater saphenous vein stripping and I think sclerotherapy of the right greater saphenous vein. She didn't really look at the lesser saphenous vein this both wounds are on the medial aspect. She has reflux in the common femoral vein and popliteal vein and an accessory vein on the right and the common femoral vein and popliteal vein on the left. I'm going to have her go to see vein and vascular just the look over things and see if anything else beside aggressive compression is indicated here. We have not been able to make any progress on these wounds in spite of the fact that the surface of the wounds is never look too bad. 11/20/17; no major change in the condition of the wounds. Patient reports a large amount of drainage. She has a lot of complaints of pain although enlisting her today I wonder if some of this at least his neuropathic rather than secondary to her wounds. She has an appointment with vein and vascular on 12/30/17. The refractory nature of these wounds in my mind at least  need vein and vascular to look over the wounds the recent reflux studies we did and her history to see if anything further can be done  here. I also note her gait is deteriorated quite a bit. 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 She has a bilateral Trendelenburg gait. I wonder if this is neuropathic or perhaps multilevel radicular. 11/27/17; her wounds actually looks slightly better. Healthy-looking granulation tissue a scant amount of epithelialization. [place] [facility] will not pay for [employer]. They will play for tri layer Oasis and Dermagraft. This is not a diabetic ulcer. We'll try for the tri layer Oasis. She still complains of some drainage. She has a vein and vascular appointment on 12/30/17 12/04/17; the wounds visually look quite good. Healthy-looking granulation with some degree of epithelialization. We are still waiting for response to our request for trial to try layer Oasis. Her appointment with vascular to review venous and arterial issues isn't sold the end of July 7/31. Not allow debridement or even vigorous cleansing of the wound surface. 12/18/17; slightly smaller especially on the right. 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 We've been using Hydrofera Blue. We still are looking into triple layer Oasis through her insurance 01/08/18 on evaluation today patient's wound actually appears to be showing signs of good improvement at this point in time. She has been tolerating the dressing changes without complication. Fortunately there does not appear to be any evidence of infection at this point in time. We have been utilizing silver nitrate which does seem to be of benefit for her which is also good news. Overall I'm very happy with how things seem to be both regards appearance as well as measurement. Patient did see [name] for evaluation on 12/30/17. In his assessment he felt that stripping would not likely add much more than chronic  compression to the patient's healing process. His recommendation was to follow-up in three months with [name] if she hasn't healed in order to consider referral back to you and see vascular where she previously was in a trial and was able to get her wound to heal. I'll be see what she feels she when you staying compression and he reiterated this as well. 01/13/18 on evaluation today patient appears to actually be doing very well in regard to her bilateral medial malleolus ulcers. She seems to have tolerated the chemical cauterization with silver nitrate last week she did have some pain through that evening but fortunately states that I'll be see since it seems to be doing better she is overall pleased with the progress. 01/21/18; really quite a remarkable improvement since I've last seen these wounds. We started using silver nitrate specially on the islands of hyper granulation which for some reason her around the wound circumference. This is really done quite nicely. Primary dressing Hydrofera Blue under 4 layer compression. She seems to be able to hold out without a nurse rewrap. Follow-up in 1 week 01/28/18; we've continued the hydrofera blue but continued with chemical cauterization to the wound area that we started about a month ago for irregular hyper granulation. She is made almost stunning improvement in the overall wound dimensions. I was not really expecting this degree of improvement in these chronic wounds 02/05/18; we continue with Hydrofera Bluebut of also continued the aggressive chemical cauterization with silver nitrate. We made nice progress with the right greater than left wound. 02/12/18. We continued with Hydrofera Blue after aggressive chemical cauterization with silver nitrate. We appear to be making nice progress with both wound areas 02/19/2018; we continue with [facility] after washing the wounds vigorously with Anasept spray and chemical cauterization  with silver nitrate.  We are making excellent progress. The area on the right's just about closed 02/26/2018. The area on the left medial ankle had too much necrotic debris today. I used a #5 curette we are able to get most of the soft. I continued with the silver nitrate to the much smaller wound on the right medial ankle she had a new area on her right lower pretibial area which she says was due to a role in her compression 03/05/2018; both wound areas look healthy. Not much change in dimensions from last week. I continue to use silver nitrate and Hydrofera Blue. The patient saw [name] of vein and vascular. He felt she had venous stasis ulcers. He felt based on her previous arteriogram she should have adequate circulation for healing. Also she has deep venous reflux but really no significant correctable superficial venous reflux at this time. He felt we should continue with conservative management including leg elevation and compression 04/02/2018; since we last saw this woman about a month ago she had a fall apparently suffered a pelvic fracture. I did not look up the x-ray. Nevertheless because of pain she literally was bedbound for 2 weeks and had home health coming out to change the dressing. Somewhat predictably this is resulted in considerable improvement in both wound areas. The right is just about closed on the medial malleolus and the left is about half the size. 04/16/2018; both her wounds continue to go down in size. Using Hydrofera Blue. 05/07/18; both her wounds appeared to be improving especially on the right where it is almost closed. We are using Hydrofera Blue 05/14/2018; slightly worse this week with larger wounds. Surface on the left medial not quite as good. We have been using Hydrofera Blue 05/21/18; again the wounds are slightly larger. Left medial malleolus slightly larger with eschar around the circumference. We have been using Hydrofera Blue undergoing a wraps for a prolonged period of time. This  got a lot better when she was more recumbent due to a fall and a back injury. I change the primary dressing the silver alginate today. She did not tolerate a 4 layer compression previously although I may need to bring this up with her next time 05/28/2018; area on the left medial malleolus again is slightly larger with more drainage. Area on the right is roughly unchanged. She has a small area of folliculitis on the right medial just on the lower calf. This does not look ominous. 06/03/2018 left medial malleolus slightly smaller in a better looking surface. We used silver nitrate on this last time with silver alginate. The area on the right appears slightly smaller 1/10; left medial malleolus slightly smaller. Small open area on the right. We used silver nitrate and silver alginate as of 2 weeks ago. We continue with the wound and compression. These got a lot better when she was off her feet 1/17; right medial malleolus wound is smaller. The left may be slightly smaller. Both surfaces look somewhat better. 1/24; both wounds are slightly smaller. Using silver alginate under Unna boots 1/31; both wounds appear smaller in fact the area on the right medial is just about closed. Surface eschar. We have been using silver alginate under Unna boots. The patient is less active now spends let much less time on her feet and I think this is contributed to the general improvement in the wound condition 2/7; both wounds appear smaller. I was hopeful the right medial would be closed however there there  is still the same small open area. Slight amount of surface eschar on the left the dimensions are smaller there is eschar but the wound edges appear to be free. We have been using silver alginate under Unna boot's 2/14; both wounds once again measure smaller. Circumferential eschar on the left medial. We have been using silver alginate under Unna boots with gradual improvement 2/21; the area on the right medial  malleolus has healed. The area on the left is smaller. We have been using silver alginate and Unna boots. We can discharge wrapping the right leg she has 20/30 stockings at home she will need to protect the scar tissue in this area 2/28; the area on the right medial malleolus remains closed the patient has a compression stocking. The area on the left is smaller. We have been using silver alginate and Unna boots. 3/6 the area on the right medial ankle remains closed. Good edema control noted she is using her own compression stocking. The area on the left medial ankle is smaller. We have been managing this with silver alginate and Unna boots which we will continue today. 3/13; the area on the right medial ankle remains closed and I'm declaring it healed today. When necessary the left is about the same still a healthy-looking surface but no major change and wound area. No evidence of infection and using silver alginate under unna and generally making considerable improvement 3/27 the area on the right medial ankle remains closed the area on the left is about the same as last week. Certainly not any worse we have been using silver alginate under an Unna boot 4/3; the area on the right medial ankle remains closed per the patient. We did not look at this wound. The wound on the left medial ankle is about the same surface looks healthy we have been using silver alginate under an Unna boot 4/10; area on the right medial ankle remains closed per the patient. We did not look at this wound. The wound on the left medial ankle is slightly larger. The patient complains that the [facility] caused burning pain all week. She also told us that she was a lot more active this week. Changed her back to silver alginate 4/17; right medial ankle still closed per the patient. Left medial ankle is slightly larger. Using silver alginate. She did not tolerate Hydrofera Blue on this area 4/24; right medial ankle remains  closed we have not look at this. The left medial ankle continues to get larger today by about a centimeter. We have been using silver alginate under Unna boots. She complains about 4 layer compression as an alternative. She has been up on her feet working on her garden 5/8; right medial ankle remains closed we did not look at this. The left medial ankle has increased in size about 100%. We have been using silver alginate under Unna boots. She noted increased pain this week and was not surprised that the wound is deteriorated 5/15; no major change in SA however much less erythema ( one week of doxy ocellulitis). 5/22-64 year old female returns at 1 week to the clinic for left medial ankle wound for which we have been using silver alginate under 3 layer compression She was placed on DOXY at last visit - the wound is wider at this visit. She is in 3 layer compression 5/29; change to [facility] last week. I had given her empiric doxycycline 2 weeks ago for a week. She is in 3 layer compression.  She complains of a lot of pain and drainage on presentation today. 6/5; using Hydrofera Blue. I gave her doxycycline recently empirically for erythema and pain around the wound. Believe her cultures showed enterococcus which not would not have been well covered by doxycycline nevertheless the wound looks better and I don't feel specifically that the enterococcus needs to be covered. She has a new what looks like a wrap injury on her lateral left ankle. 6/12; she is using Hydrofera Blue. She has a new area on the left anterior lower tibial area. This was a wrap injury last week. 6/19; the patient is using Hydrofera Blue. She arrived with marked inflammation and erythema around the wound and tenderness. 12/01/18 on evaluation today patient appears to be doing a little bit better based on what I'm hearing from the standpoint of lassos evaluation to this as far as the overall appearance of the wound is concerned.  Then sometime substandard she typically sees [name]. Nonetheless overall very pleased with the progress that she's made up to this point. No fevers, chills, nausea, or vomiting noted at this time. 7/10; some improvement in the surface area. Aggressively debrided last week apparently. I went ahead with the debridement today although the patient does not tolerate this very well. We have been using Iodoflex. Still a fair amount of drainage 7/17; slightly smaller. Using Iodoflex. 7/24; no change from last week in terms of surface area. We have been using Iodoflex. Surface looks and continues to look somewhat better 7/31; surface area slightly smaller better looking surface. We have been using Iodoflex. This is under Unna boot compression 8/7-Patient presents at 1 week with Unna boot and Iodoflex, wound appears better 8/14-Patient presents at 1 week with Iodoflex, we use the Unna boot, wound appears to be stable better.Patient is getting Botox treatment for the inversion of the foot for tendon release, Next week 8/21; we are using Iodoflex. Unna boot. The wound is stable in terms of surface area. Under illumination there is some areas of the wound that appear to be either epithelialized or perhaps this is adherent slough at this point I was not really clear. It did not wipe off and I was reluctant to debride this today. 8/28; we are using Iodoflex in an Unna boot. Seems to be making good improvement. 9/4; using Iodoflex and wound is slightly smaller. 9/18; we are using Iodoflex with topical silver nitrate when she is here. The wound continues to be smaller 10/2; patient missed her appointment last week due to GI issues. She left and Iodoflex based dressing on for 2 weeks. Wound is about the same size about the size of a dime on the left medial lower 10/9 we have been using Iodoflex on the medial left ankle wound. She has a new superficial probable wrap injury on the dorsal left ankle 10/16; we have  been using Hydrofera Blue since last week. This is on the left medial ankle 10/23; we have been using Hydrofera Blue since 2 weeks ago. This is on the left medial ankle. Dimensions are better 11/6; using Hydrofera Blue. I think the wound is smaller but still not closed. Left medial ankle 11/13; we have been using Hydrofera Blue. Wound is certainly no smaller this week. Also the surface not as good. This is the remanent of a very large area on her left medial ankle. 11/20; using Sorbact since last week. Wound was about the same in terms of size although I was disappointed about the surface debris 12/11;  3-week follow-up. Patient was on vacation. Wound is measuring slightly larger we have been using Sorbact. 12/18; wound is about the same size however surface looks better last week after debridement. We have been using Sorbact under compression 1/15 wound is probably twice the size of last time increased in length nonviable surface. We have been using Sorbact. She was running a mild fever and missed her appointment last week 1/22; the wound is come down in size but under illumination still a very adherent debris we have been Hydrofera Blue that I changed her to last week 1/29; dimensions down slightly. We have been using Hydrofera Blue 2/19 dimensions are the same however there is rims of epithelialization under illumination. Therefore more the surface area may be epithelialized 2/26; the patient's wound actually measures smaller. The wound looks healthy. We have been using Hydrofera Blue. I had some thoughts about running Apligraf then I still may do that however this looks so much better this week we will delay that for now 3/5; the wound is small but about the same as last week. We have been using Hydrofera Blue. No debridement is required today. 3/19; the wound is about the size of a dime. Healthy looking wound even under illumination. We have been using Hydrofera Blue. No mechanical  debridement is necessary 3/26; not much change from last week although still looks very healthy. We have been using Hydrofera Blue under Unna boots Patient was offered an ankle fusion by podiatry but not until the wound heals with a proceed with this. 4/9; the patient comes in today with her original wound on the medial ankle looking satisfactory however she has some uncontrolled swelling in the middle part of her leg with 2 new open areas superiorly just lateral to the tibia. I think this was probably a wrap issue. She said she felt uncomfortable during the week but did not call in. We have been using Hydrofera Blue 4/16; the wound on the medial ankle is about the same. She has innumerable small areas superior to this across her mid tibia. I think this is probably folliculitis. She is also been working in the yard doing a lot of sweating 4/30; the patient issue on the upper areas across her mid tibia of all healed. I think this was excessive yard work if I remember. Her wound on the medial ankle is smaller. Some debris on this we have been using Hydrofera Blue under Unna boots Objective Constitutional Respirations regular, non-labored and within target range.. Temperature is normal and within the target range for the patient.. Vitals Time Taken: 10:05 AM, Height: 68 in, Source: Stated, Weight: 127 lbs, Source: Stated, BMI: 19.3, Respiratory Rate: 18 breaths/min. General Notes: Wound exam; left medial malleolus under illumination had a nonviable senescent looking surface. I used a #3 curette to debride this and really look quite healthy post debridement removed subcutaneous tissue hemostasis with direct pressure. This is about the size of a dime now. The last part of a very substantial chronic wound in this area. There is no evidence of infection pedal pulses are palpable Integumentary (Hair, Skin) Wound #12 status is Healed - Epithelialized. Original cause of wound was Gradually Appeared. The  wound is located on the Left,Proximal,Anterior Lower Leg. The wound measures 0cm length x 0cm width x 0cm depth; 0cm^2 area and 0cm^3 volume. There is no tunneling or undermining noted. There is a none present amount of drainage noted. The wound margin is distinct with the outline attached to the wound base.  There is no granulation within the wound bed. There is no necrotic tissue within the wound bed. Wound #13 status is Healed - Epithelialized. Original cause of wound was Gradually Appeared. The wound is located on the [facility] Lower Leg. The wound measures 0cm length x 0cm width x 0cm depth; 0cm^2 area and 0cm^3 volume. There is no tunneling or undermining noted. There is a none present amount of drainage noted. The wound margin is distinct with the outline attached to the wound base. There is no granulation within the wound bed. There is no necrotic tissue within the wound bed. Wound #3 status is Open. Original cause of wound was Gradually Appeared. The wound is located on the Left,Medial Malleolus. The wound measures 0.8cm length x 0.9cm width x 0.1cm depth; 0.565cm^2 area and 0.057cm^3 volume. There is Fat Layer (Subcutaneous Tissue) Exposed exposed. There is no tunneling or undermining noted. There is a small amount of serosanguineous drainage noted. The wound margin is flat and intact. There is large (67-100%) red, pink granulation within the wound bed. There is no necrotic tissue within the wound bed. 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 There was a Excisional Skin/Subcutaneous Tissue Debridement with a total area of 0.72 sq cm performed by [name]., MD. With the  following instrument(s): Curette to remove Viable and Non-Viable tissue/material. Material removed includes Subcutaneous Tissue and Slough and after achieving pain control using Lidocaine 5% topical ointment. No specimens were taken. A time out was conducted at 11:00, prior to the start of the procedure. A Minimum amount of bleeding was controlled with Pressure. The procedure was tolerated well with a pain level of 0 throughout and a pain level of 0 following the procedure. Post Debridement Measurements: 0.8cm length x 0.9cm width x 0.1cm depth; 0.057cm^3 volume. Character of Wound/Ulcer Post Debridement is improved. Severity of Tissue Post Debridement is: Fat layer exposed. Post procedure Diagnosis Wound #3: Same as Pre-Procedure Pre-procedure diagnosis of Wound #3 is a Venous Leg Ulcer located on the Left,Medial Malleolus . There was a Haematologist Compression Therapy Procedure by Levan Hurst, RN. Post procedure Diagnosis Wound #3: Same as Pre-Procedure Plan Follow-up Appointments: Return Appointment in 1 week. Dressing Change Frequency: Wound #3 Left,Medial Malleolus: Do not change entire dressing for one week. Skin Barriers/Peri-Wound Care: Moisturizing lotion TCA Cream or Ointment - mixed with lotion Wound Cleansing: May shower with protection. Primary Wound Dressing: Wound #3 Left,Medial Malleolus: Hydrofera Blue - classic Secondary Dressing: Wound #3 Left,Medial Malleolus: Dry Gauze Edema Control: Unna Boot to Left Lower Extremity Avoid standing for long periods of time Elevate legs to the level of the heart or above for 30 minutes daily and/or when sitting, a frequency of: - throughout the day Support Garment 20-30 mm/Hg pressure to: - compression stocking right leg daily 1. Continue with Hydrofera Blue under an Haematologist 2. The patient is talking about surgery to stabilize her ankle by [name]. It would be nice to have this wound healed by that point Electronic  Signature(s) Signed: 09/30/2019 6:02:27 PM By: Linton Ham MD Entered By: Linton Ham on 09/30/2019 11:27:57 -------------------------------------------------------------------------------- SuperBill Details Patient Name: Date of Service: [name] [name], [name] 09/30/2019 Medical Record Number: [id number] Patient Account Number: [id number]  Date of Birth/Sex: Treating RN: [birth year] (64 y.o. [name] Primary Care Provider: [name] Other Clinician: Referring Provider: Treating Provider/Extender: [name] in Treatment: 355 Diagnosis Coding ICD-10 Codes Code Description [phone] Non-pressure chronic ulcer of left ankle with fat layer exposed L97.321 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 breakdown of skin Facility Procedures CPT4 Code: [id number] Description: 22411 - DEB SUBQ TISSUE 20 SQ CM/< ICD-10 Diagnosis Description L97.322 Non-pressure chronic ulcer of left ankle with fat layer exposed Modifier: Quantity: 1 Physician Procedures : CPT4 Code Description Modifier [id number] - WC PHYS SUBQ TISS 20 SQ CM ICD-10 Diagnosis Description T00.349 Non-pressure chronic ulcer of left ankle with fat layer exposed Quantity: 1 Electronic Signature(s) Signed: 09/30/2019 6:02:27 PM By: Linton Ham MD Entered By: Linton Ham on 09/30/2019 11:28:16

## 2019-09-30 NOTE — Progress Notes (Signed)
[name], [name] ([id number]) Visit Report for 09/30/2019 Arrival Information Details Patient Name: Date of Service: [facility], [name] 09/30/2019 10:00 A M Medical Record Number: [id number] Patient Account Number: [id number] Date of Birth/Sex: Treating RN: 1955/09/14 (64 y.o. [name], [name] Primary Care Ashly Yepez: [name] Other Clinician: Referring Tiny Chaudhary: Treating Kiante Petrovich/Extender: [name] in Treatment: 12 Visit Information History Since Last Visit Added or deleted any medications: No Patient Arrived: [name] Any new allergies or adverse reactions: No Arrival Time: 10:04 Had a fall or experienced change in No Accompanied By: self activities of daily living that may affect Transfer Assistance: None risk of falls: Patient Identification Verified: Yes Signs or symptoms of abuse/neglect since last visito No Secondary Verification Process Completed: Yes Hospitalized since last visit: No Patient Requires Transmission-Based Precautions: No Implantable device outside of the clinic excluding No Patient Has Alerts: No cellular tissue based products placed in the center since last visit: Has Dressing in Place as Prescribed: Yes Has Compression in Place as Prescribed: Yes Pain Present Now: Yes Electronic Signature(s) Signed: 09/30/2019 5:50:44 PM By: Baruch Gouty RN, BSN Entered By: Baruch Gouty on 09/30/2019 10:05:50 -------------------------------------------------------------------------------- Compression Therapy Details Patient Name: Date of Service: [name], [name] 09/30/2019 10:00 A M Medical Record Number: [id number] Patient Account Number: [id number] Date of Birth/Sex: Treating RN: Apr 15, 1956 (64 y.o. [name] Primary Care Codie Krogh: [name] Other Clinician: Referring Maybree Riling: Treating Oniya Mandarino/Extender: [name] in Treatment: 355 Compression Therapy Performed for Wound Assessment:  Wound #3 Left,Medial Malleolus Performed By: Clinician Levan Hurst, RN Compression Type: Rolena Infante Post Procedure Diagnosis Same as Pre-procedure Electronic Signature(s) Signed: 09/30/2019 5:50:39 PM By: Levan Hurst RN, BSN Entered By: Levan Hurst on 09/30/2019 11:04:03 -------------------------------------------------------------------------------- Lower Extremity Assessment Details Patient Name: Date of Service: [name], [name] 09/30/2019 10:00 A M Medical Record Number: [id number] Patient Account Number: [id number] Date of Birth/Sex: Treating RN: 06-Jan-1956 (64 y.o. [name] Primary Care Kyrstan Gotwalt: [name] Other Clinician: Referring Abdulla Pooley: Treating Sinjin Amero/Extender: [name] in Treatment: 355 Edema Assessment Assessed: [name]: No] [Right: No] Edema: [Left: Ye] [Right: s] Calf Left: Right: Point of Measurement: 33 cm From Medial Instep 31 cm cm Ankle Left: Right: Point of Measurement: 12 cm From Medial Instep 20.2 cm cm Vascular Assessment Pulses: Dorsalis Pedis Palpable: [Left:Yes] Electronic Signature(s) Signed: 09/30/2019 5:50:44 PM By: Baruch Gouty RN, BSN Entered By: Baruch Gouty on 09/30/2019 10:11:15 -------------------------------------------------------------------------------- Multi Wound Chart Details Patient Name: Date of Service: [name] [name], [name] 09/30/2019 10:00 A M Medical Record Number: [id number] Patient Account Number: [id number] Date of Birth/Sex: Treating RN: September 28, 1955 (64 y.o. [name] Primary Care Hyla Coard: [name] Other Clinician: Referring Coltin Casher: Treating Nnaemeka Samson/Extender: [name] in Treatment: 355 Vital Signs Height(in): 68 Pulse(bpm): Weight(lbs): 127 Blood Pressure(mmHg): Body Mass Index(BMI): 19 Temperature(F): Respiratory Rate(breaths/min): 18 Photos: [12:No Photos Left, Proximal, Anterior Lower Leg] [13:No  Photos Left, Distal, Anterior Lower Leg] Wound Location: [12:Gradually Appeared] [13:Gradually Appeared] Wounding Event: [12:Venous Leg Ulcer] [13:Venous Leg Ulcer] Primary Etiology: [12:Congestive Heart Failure, Peripheral] [13:Congestive Heart Failure, Peripheral] Comorbid History: [12:Vascular Disease, Congestive Heart Failure, End Stage Renal Disease, Tobacco Use, Chronic Obstructive Pulmonary Disease (COPD), Type 1 Diabetes 09/09/2019] [13:Vascular Disease, Congestive Heart Failure, End Stage Renal Disease,  Tobacco Use, Chronic Obstructive Pulmonary Disease (COPD), Type 1 Diabetes 09/09/2019] Date Acquired: [12:3] [13:3] Weeks of Treatment: [12:Healed - Epithelialized] [13:Healed - Epithelialized] Wound Status: [12:0x0x0] [13:0x0x0]  Measurements L x W x D (cm) [12:0] [13:0] A (cm) : rea [12:0] [13:0] Volume (cm) : [12:100.00%] [13:100.00%] % Reduction in Area: [12:100.00%] [13:100.00%] % Reduction in Volume: [12:Full Thickness Without Exposed] [13:Full Thickness Without Exposed] Classification: [12:Support Structures None Present] [13:Support Structures None Present] Exudate A mount: [12:N/A] [13:N/A] Exudate Type: [12:N/A] [13:N/A] Exudate Color: [12:Distinct, outline attached] [13:Distinct, outline attached] Wound Margin: [12:None Present (0%)] [13:None Present (0%)] Granulation A mount: [12:N/A] [13:N/A] Granulation Quality: [12:None Present (0%)] [13:None Present (0%)] Necrotic A mount: [12:Fascia: No] [13:Fascia: No] Exposed Structures: [12:Fat Layer (Subcutaneous Tissue) Exposed: No Tendon: No Muscle: No Joint: No Bone: No Large (67-100%)] [13:Fat Layer (Subcutaneous Tissue) Exposed: No Tendon: No Muscle: No Joint: No Bone: No Large (67-100%)] Epithelialization: [12:N/A] [13:N/A] Debridement: Pre-procedure Verification/Time Out N/A [13:N/A] Taken: [12:N/A] [13:N/A] Pain Control: [12:N/A] [13:N/A] Tissue Debrided: [12:N/A] [13:N/A] Level: [12:N/A] [13:N/A] Debridement A (sq  cm): [12:rea N/A] [13:N/A] Instrument: [12:N/A] [13:N/A] Bleeding: [12:N/A] [13:N/A] Hemostasis A chieved: [12:N/A] [13:N/A] Procedural Pain: [12:N/A] [13:N/A] Post Procedural Pain: [12:N/A] [13:N/A] Debridement Treatment Response: [12:N/A] [13:N/A] Post Debridement Measurements L x W x D (cm) [12:N/A] [13:N/A] Post Debridement Volume: (cm) [12:N/A] [13:N/A] Procedures Performed: Treatment Notes Electronic Signature(s) Signed: 09/30/2019 5:51:03 PM By: Kela Millin Signed: 09/30/2019 6:02:27 PM By: Linton Ham MD Entered By: Linton Ham on 09/30/2019 11:23:45 -------------------------------------------------------------------------------- Multi-Disciplinary Care Plan Details Patient Name: Date of Service: [facility] [name], [name] 09/30/2019 10:00 A M Medical Record Number: [id number] Patient Account Number: [id number] Date of Birth/Sex: Treating RN: 23-Mar-1956 (64 y.o. [name] Primary Care Rogelio Waynick: [name] Other Clinician: Referring Fynley Chrystal: Treating Skanda Worlds/Extender: [name] in Treatment: 59 Active Inactive Abuse / Safety / Falls / Self Care Management Nursing Diagnoses: History of Falls Potential for falls Goals: Patient will not experience any injury related to falls Date Initiated: 08/05/2019 Target Resolution Date: 10/07/2019 Goal Status: Active Patient/caregiver will verbalize/demonstrate measures taken to prevent injury and/or falls Date Initiated: 08/05/2019 Target Resolution Date: 10/07/2019 Goal Status: Active Interventions: Assess fall risk on admission and as needed Assess impairment of mobility on admission and as needed per policy Notes: Venous Leg Ulcer Nursing Diagnoses: Actual venous Insuffiency (use after diagnosis is confirmed) Goals: Patient will maintain optimal edema control Date Initiated: 09/04/2017 Target Resolution Date: 10/07/2019 Goal Status: Active Patient/caregiver will verbalize  understanding of disease process and disease management Date Initiated: 08/03/2014 Date Inactivated: 08/28/2017 Target Resolution Date: 08/29/2017 Goal Status: Met Interventions: Compression as ordered Treatment Activities: Therapeutic compression applied : 09/21/2015 Notes: Wound/Skin Impairment Nursing Diagnoses: Impaired tissue integrity Knowledge deficit related to ulceration/compromised skin integrity Goals: Patient will have a decrease in wound volume by X% from date: (specify in notes) Date Initiated: 04/05/2015 Date Inactivated: 02/12/2017 Target Resolution Date: 09/01/2016 Unmet Reason: chronic, unable to Goal Status: Unmet tolerate stronger compression Patient/caregiver will verbalize understanding of skin care regimen Date Initiated: 04/05/2015 Target Resolution Date: 10/14/2019 Goal Status: Active Interventions: Assess patient/caregiver ability to obtain necessary supplies Assess patient/caregiver ability to perform ulcer/skin care regimen upon admission and as needed Assess ulceration(s) every visit Provide education on ulcer and skin care Notes: Electronic Signature(s) Signed: 09/30/2019 5:50:39 PM By: Levan Hurst RN, BSN Entered By: Levan Hurst on 09/30/2019 11:36:25 -------------------------------------------------------------------------------- Pain Assessment Details Patient Name: Date of Service: [name], [name] 09/30/2019 10:00 A M Medical Record Number: [id number] Patient Account Number: [id number] Date of Birth/Sex: Treating RN: May 23, 1956 (64 y.o. [name] Primary Care Jahnya Trindade: [name] Other Clinician: Referring Chastidy Ranker: Treating Arnie Clingenpeel/Extender:  [name] in Treatment: 355 Active Problems Location of Pain Severity and Description of Pain Patient Has Paino Yes Site Locations Pain Location: Generalized Pain, Pain in Ulcers With Dressing Change: Yes Duration of the Pain. Constant / Intermittento  Intermittent Rate the pain. Current Pain Level: 5 Worst Pain Level: 10 Least Pain Level: 4 Character of Pain Describe the Pain: Sharp, Shooting Pain Management and Medication Current Pain Management: Medication: Yes Is the Current Pain Management Adequate: Adequate Rest: Yes How does your wound impact your activities of daily livingo Sleep: Yes Bathing: No Appetite: No Relationship With Others: No Bladder Continence: No Emotions: Yes Bowel Continence: No Work: Yes Toileting: No Drive: No Dressing: No Hobbies: No Engineer, maintenance) Signed: 09/30/2019 5:50:44 PM By: Baruch Gouty RN, BSN Entered By: Baruch Gouty on 09/30/2019 10:07:47 -------------------------------------------------------------------------------- Patient/Caregiver Education Details Patient Name: Date of Service: [name] [name], [name] 4/30/2021andnbsp10:00 A M Medical Record Number: [id number] Patient Account Number: [id number] Date of Birth/Gender: Treating RN: Oct 07, 1955 (64 y.o. [name] Primary Care Physician: [name] Other Clinician: Referring Physician: Treating Physician/Extender: [name] in Treatment: 355 Education Assessment Education Provided To: Patient Education Topics Provided Wound/Skin Impairment: Methods: Explain/Verbal Responses: State content correctly [employer]) Signed: 09/30/2019 5:50:39 PM By: Levan Hurst RN, BSN Entered By: Levan Hurst on 09/30/2019 11:36:49 -------------------------------------------------------------------------------- Wound Assessment Details Patient Name: Date of Service: [name], [name] 09/30/2019 10:00 A M Medical Record Number: [id number] Patient Account Number: [id number] Date of Birth/Sex: Treating RN: 08-04-1955 (64 y.o. [name] Primary Care Baer Hinton: [name] Other Clinician: Referring Yulian Gosney: Treating [place] Gustafson/Extender: [name] in Treatment: Martell Wound Status Wound Number: 12 Primary Venous Leg Ulcer Etiology: Wound Location: Left, Proximal, Anterior Lower Leg Wound Healed - Epithelialized Wounding Event: Gradually Appeared Status: Date Acquired: 09/09/2019 Comorbid Congestive Heart Failure, Peripheral Vascular Disease, Congestive Weeks Of Treatment: 3 History: Heart Failure, End Stage Renal Disease, Tobacco Use, Chronic Clustered Wound: No Obstructive Pulmonary Disease (COPD), Type 1 Diabetes Wound Measurements Length: (cm) Width: (cm) Depth: (cm) Area: (cm) Volume: (cm) 0 % Reduction in Area: 100% 0 % Reduction in Volume: 100% 0 Epithelialization: Large (67-100%) 0 Tunneling: No 0 Undermining: No Wound Description Classification: Full Thickness Without Exposed Support Structures Wound Margin: Distinct, outline attached Exudate Amount: None Present Foul Odor After Cleansing: No Slough/Fibrino No Wound Bed Granulation Amount: None Present (0%) Exposed Structure Necrotic Amount: None Present (0%) Fascia Exposed: No Fat Layer (Subcutaneous Tissue) Exposed: No Tendon Exposed: No Muscle Exposed: No Joint Exposed: No Bone Exposed: No Electronic Signature(s) Signed: 09/30/2019 5:50:44 PM By: Baruch Gouty RN, BSN Entered By: Baruch Gouty on 09/30/2019 10:13:03 -------------------------------------------------------------------------------- Wound Assessment Details Patient Name: Date of Service: [name], [name] 09/30/2019 10:00 A M Medical Record Number: [id number] Patient Account Number: [id number] Date of Birth/Sex: Treating RN: [birth year] (64 y.o. [name] Primary Care Genesys Coggeshall: [name] Other Clinician: Referring Markee Matera: Treating Rameen Gohlke/Extender: [name] in Treatment: 355 Wound Status Wound Number: 13 Primary Venous Leg Ulcer Etiology: Wound Location: Left, Distal, Anterior Lower Leg Wound Healed -  Epithelialized Wounding Event: Gradually Appeared Status: Date Acquired: 09/09/2019 Comorbid Congestive Heart Failure, Peripheral Vascular Disease, Congestive Weeks Of Treatment: 3 History: Heart Failure, End Stage Renal Disease, Tobacco Use, Chronic Clustered Wound: No Obstructive Pulmonary Disease (COPD), Type 1 Diabetes Wound Measurements Length: (cm) Width: (cm) Depth: (cm) Area: (cm) Volume: (cm) 0 % Reduction in Area: 100%  0 % Reduction in Volume: 100% 0 Epithelialization: Large (67-100%) 0 Tunneling: No 0 Undermining: No Wound Description Classification: Full Thickness Without Exposed Support Structures Wound Margin: Distinct, outline attached Exudate Amount: None Present Foul Odor After Cleansing: No Slough/Fibrino Yes Wound Bed Granulation Amount: None Present (0%) Exposed Structure Necrotic Amount: None Present (0%) Fascia Exposed: No Fat Layer (Subcutaneous Tissue) Exposed: No Tendon Exposed: No Muscle Exposed: No Joint Exposed: No Bone Exposed: No Electronic Signature(s) Signed: 09/30/2019 5:50:44 PM By: Baruch Gouty RN, BSN Entered By: Baruch Gouty on 09/30/2019 10:13:28 -------------------------------------------------------------------------------- Wound Assessment Details Patient Name: Date of Service: [name], [name] 09/30/2019 10:00 A M Medical Record Number: [id number] Patient Account Number: [id number] Date of Birth/Sex: Treating RN: 1955-10-04 (64 y.o. [name] Primary Care Tyrell Seifer: [name] Other Clinician: Referring Mehreen Azizi: Treating Jessice Madill/Extender: [name] in Treatment: 355 Wound Status Wound Number: 3 Primary Venous Leg Ulcer Etiology: Wound Location: Left, Medial Malleolus Wound Open Wounding Event: Gradually Appeared Status: Date Acquired: 11/15/2012 Comorbid Congestive Heart Failure, Peripheral Vascular Disease, Congestive Weeks Of Treatment: 355 History: Heart Failure, End  Stage Renal Disease, Tobacco Use, Chronic Clustered Wound: No Obstructive Pulmonary Disease (COPD), Type 1 Diabetes Wound Measurements Length: (cm) 0.8 Width: (cm) 0.9 Depth: (cm) 0.1 Area: (cm) 0.565 Volume: (cm) 0.057 % Reduction in Area: 82% % Reduction in Volume: 90.9% Epithelialization: Small (1-33%) Tunneling: No Undermining: No Wound Description Classification: Full Thickness Without Exposed Support Structures Wound Margin: Flat and Intact Exudate Amount: Small Exudate Type: Serosanguineous Exudate Color: red, brown Foul Odor After Cleansing: No Slough/Fibrino Yes Wound Bed Granulation Amount: Large (67-100%) Exposed Structure Granulation Quality: Red, Pink Fascia Exposed: No Necrotic Amount: None Present (0%) Fat Layer (Subcutaneous Tissue) Exposed: Yes Tendon Exposed: No Muscle Exposed: No Joint Exposed: No Bone Exposed: No Treatment Notes Wound #3 (Left, Medial Malleolus) 1. Cleanse With Wound Cleanser Soap and water 2. Periwound Care Barrier cream TCA Ointment 3. Primary Dressing Applied Hydrofera Blue 4. Secondary Dressing Dry Gauze Kerramax/Xtrasorb 6. Support Layer Best boy) Signed: 09/30/2019 5:50:44 PM By: Baruch Gouty RN, BSN Entered By: Baruch Gouty on 09/30/2019 10:14:11 -------------------------------------------------------------------------------- [place] Details Patient Name: Date of Service: [name], [name] 09/30/2019 10:00 A M Medical Record Number: [id number] Patient Account Number: [id number] Date of Birth/Sex: Treating RN: 04/19/1956 (64 y.o. [name] Primary Care Ranbir Chew: [name] Other Clinician: Referring Rojean Ige: Treating Kawana Hegel/Extender: [name] in Treatment: 355 Vital Signs Time Taken: 10:05 Respiratory Rate (breaths/min): 18 Height (in): 68 Reference Range: 80 - 120 mg / dl Source: Stated Weight (lbs): 127 Source:  Stated Body Mass Index (BMI): 19.3 Electronic Signature(s) Signed: 09/30/2019 5:50:44 PM By: Baruch Gouty RN, BSN Entered By: Baruch Gouty on 09/30/2019 10:06:55

## 2019-10-07 ENCOUNTER — Encounter (HOSPITAL_BASED_OUTPATIENT_CLINIC_OR_DEPARTMENT_OTHER): Payer: Medicare Other | Attending: Internal Medicine | Admitting: Internal Medicine

## 2019-10-07 DIAGNOSIS — L97322 Non-pressure chronic ulcer of left ankle with fat layer exposed: Secondary | ICD-10-CM | POA: Diagnosis present

## 2019-10-07 NOTE — Progress Notes (Signed)
SHAWNETTE, AUGELLO ([id number]) Visit Report for 10/07/2019 Arrival Information Details Patient Name: Date of Service: [facility], [name] 10/07/2019 1:15 PM Medical Record Number: [id number] Patient Account Number: [id number] Date of Birth/Sex: Treating RN: [birth year] (64 y.o. [name] Paulita Licklider: [name] Other Clinician: Referring Kasidi Shanker: Treating Rondal Vandevelde/Extender: [name] in Treatment: 69 Visit Information History Since Last Visit Added or deleted any medications: No Patient Arrived: [name] Any new allergies or adverse reactions: No Arrival Time: 13:15 Had a fall or experienced change in No Accompanied By: self activities of daily living that may affect Transfer Assistance: None risk of falls: Patient Identification Verified: Yes Signs or symptoms of abuse/neglect since last visito No Secondary Verification Process Completed: Yes Hospitalized since last visit: No Patient Requires Transmission-Based Precautions: No Implantable device outside of the clinic excluding No Patient Has Alerts: No cellular tissue based products placed in the center since last visit: Has Dressing in Place as Prescribed: Yes Has Compression in Place as Prescribed: Yes Pain Present Now: No Electronic Signature(s) Signed: 10/07/2019 4:59:21 PM By: Deon Pilling Entered By: Deon Pilling on 10/07/2019 13:29:46 -------------------------------------------------------------------------------- Compression Therapy Details Patient Name: Date of Service: [facility] [name], [name] 10/07/2019 1:15 PM Medical Record Number: [id number] Patient Account Number: [id number] Date of Birth/Sex: Treating RN: 05-03-1956 (64 y.o. [name] Adilynne Fitzwater: [name] Other Clinician: Referring Nathaly Dawkins: Treating Laquanda Bick/Extender: [name] in Treatment: 356 Compression Therapy Performed for Wound Assessment: Wound #3 Left,Medial  Malleolus Performed By: Clinician Deon Pilling, RN Compression Type: Rolena Infante Post Procedure Diagnosis Same as Pre-procedure Electronic Signature(s) Signed: 10/07/2019 5:35:25 PM By: Baruch Gouty RN, BSN Entered By: Baruch Gouty on 10/07/2019 14:12:43 -------------------------------------------------------------------------------- Encounter Discharge Information Details Patient Name: Date of Service: [facility] [name], [name] 10/07/2019 1:15 PM Medical Record Number: [id number] Patient Account Number: [id number] Date of Birth/Sex: Treating RN: 1955-08-18 (64 y.o. [name] Kayana Thoen: [name] Other Clinician: Referring Bryer Cozzolino: Treating Isabelle Matt/Extender: [name] in Treatment: 356 Encounter Discharge Information Items Post Procedure Vitals Discharge Condition: Stable Temperature (F): 98 Ambulatory Status: Cane Pulse (bpm): 60 Discharge Destination: Home Respiratory Rate (breaths/min): 18 Transportation: Private Auto Blood Pressure (mmHg): 119/46 Accompanied By: self Schedule Follow-up Appointment: Yes Clinical Summary of Care: Electronic Signature(s) Signed: 10/07/2019 4:59:21 PM By: Deon Pilling Entered By: Deon Pilling on 10/07/2019 14:33:03 -------------------------------------------------------------------------------- Lower Extremity Assessment Details Patient Name: Date of Service: [facility] [name], [name] 10/07/2019 1:15 PM Medical Record Number: [id number] Patient Account Number: [id number] Date of Birth/Sex: Treating RN: 10-16-1955 (64 y.o. [name] Kidada Ging: [name] Other Clinician: Referring Umeka Wrench: Treating Adilene Areola/Extender: [name] in Treatment: 356 Edema Assessment Assessed: [name]: Yes] [Right: No] Edema: [Left: Ye] [Right: s] Calf Left: Right: Point of Measurement: 33 cm From Medial Instep 31 cm cm Ankle Left: Right: Point of  Measurement: 12 cm From Medial Instep 21 cm cm Vascular Assessment Pulses: Dorsalis Pedis Palpable: [Left:Yes] Electronic Signature(s) Signed: 10/07/2019 4:59:21 PM By: Deon Pilling Signed: 10/07/2019 5:35:25 PM By: Baruch Gouty RN, BSN Entered By: Deon Pilling on 10/07/2019 13:31:01 -------------------------------------------------------------------------------- Multi Wound Chart Details Patient Name: Date of Service: [name], [name] 10/07/2019 1:15 PM Medical Record Number: [id number] Patient Account Number: [id number] Date of Birth/Sex: Treating RN: 1955/08/15 (64 y.o. [name] Margene Cherian: [name] Other Clinician: Referring Geza Beranek: Treating Debria Broecker/Extender: [name] in  Treatment: 356 Vital Signs Height(in): 68 Pulse(bpm): 60 Weight(lbs): 127 Blood Pressure(mmHg): 119/46 Body Mass Index(BMI): 19 Temperature(F): 98 Respiratory Rate(breaths/min): 18 Photos: [3:No Photos Left, Medial Malleolus] [N/A:N/A N/A] Wound Location: [3:Gradually Appeared] [N/A:N/A] Wounding Event: [3:Venous Leg Ulcer] [N/A:N/A] Primary Etiology: [3:Congestive Heart Failure, Peripheral] [N/A:N/A] Comorbid History: [3:Vascular Disease, Congestive Heart Failure, End Stage Renal Disease, Tobacco Use, Chronic Obstructive Pulmonary Disease (COPD), Type 1 Diabetes 11/15/2012] [N/A:N/A] Date Acquired: [3:356] [N/A:N/A] Weeks of Treatment: [3:Open] [N/A:N/A] Wound Status: [3:1x0.8x0.2] [N/A:N/A] Measurements L x W x D (cm) [3:0.628] [N/A:N/A] A (cm) : rea [3:0.126] [N/A:N/A] Volume (cm) : [3:80.00%] [N/A:N/A] % Reduction in A [3:rea: 79.90%] [N/A:N/A] % Reduction in Volume: [3:Full Thickness Without Exposed] [N/A:N/A] Classification: [3:Support Structures Small] [N/A:N/A] Exudate A mount: [3:Serosanguineous] [N/A:N/A] Exudate Type: [3:red, brown] [N/A:N/A] Exudate Color: [3:Flat and Intact] [N/A:N/A] Wound Margin: [3:Large (67-100%)]  [N/A:N/A] Granulation A mount: [3:Red, Pink] [N/A:N/A] Granulation Quality: [3:None Present (0%)] [N/A:N/A] Necrotic A mount: [3:Fat Layer (Subcutaneous Tissue)] [N/A:N/A] Exposed Structures: [3:Exposed: Yes Fascia: No Tendon: No Muscle: No Joint: No Bone: No Medium (34-66%)] [N/A:N/A] Epithelialization: [3:Debridement - Excisional] [N/A:N/A] Debridement: Pre-procedure Verification/Time Out 14:05 [N/A:N/A] Taken: [3:Lidocaine 4% Topical Solution] [N/A:N/A] Pain Control: [3:Subcutaneous, Slough] [N/A:N/A] Tissue Debrided: [3:Skin/Subcutaneous Tissue] [N/A:N/A] Level: [3:0.8] [N/A:N/A] Debridement A (sq cm): [3:rea Curette] [N/A:N/A] Instrument: [3:Minimum] [N/A:N/A] Bleeding: [3:Pressure] [N/A:N/A] Hemostasis A chieved: [3:4] [N/A:N/A] Procedural Pain: [3:1] [N/A:N/A] Post Procedural Pain: [3:Procedure was tolerated well] [N/A:N/A] Debridement Treatment Response: [3:1x0.8x0.1] [N/A:N/A] Post Debridement Measurements L x W x D (cm) [3:0.063] [N/A:N/A] Post Debridement Volume: (cm) [3:Compression Therapy] [N/A:N/A] Procedures Performed: [3:Debridement] Treatment Notes Electronic Signature(s) Signed: 10/07/2019 4:59:38 PM By: Linton Ham MD Signed: 10/07/2019 5:35:25 PM By: Baruch Gouty RN, BSN Entered By: Linton Ham on 10/07/2019 14:16:04 -------------------------------------------------------------------------------- Multi-Disciplinary Care Plan Details Patient Name: Date of Service: [facility] [name], [name] 10/07/2019 1:15 PM Medical Record Number: [id number] Patient Account Number: [id number] Date of Birth/Sex: Treating RN: 03/13/1956 (64 y.o. [name] Chen Saadeh: [name] Other Clinician: Referring Averil Digman: Treating Amiyah Shryock/Extender: [name] in Treatment: 60 Active Inactive Abuse / Safety / Falls / Self Care Management Nursing Diagnoses: History of Falls Potential for falls Goals: Patient will not experience  any injury related to falls Date Initiated: 08/05/2019 Target Resolution Date: 11/04/2019 Goal Status: Active Patient/caregiver will verbalize/demonstrate measures taken to prevent injury and/or falls Date Initiated: 08/05/2019 Target Resolution Date: 11/04/2019 Goal Status: Active Interventions: Assess fall risk on admission and as needed Assess impairment of mobility on admission and as needed per policy Notes: Venous Leg Ulcer Nursing Diagnoses: Actual venous Insuffiency (use after diagnosis is confirmed) Goals: Patient will maintain optimal edema control Date Initiated: 09/04/2017 Target Resolution Date: 11/04/2019 Goal Status: Active Patient/caregiver will verbalize understanding of disease process and disease management Date Initiated: 08/03/2014 Date Inactivated: 08/28/2017 Target Resolution Date: 08/29/2017 Goal Status: Met Interventions: Compression as ordered Treatment Activities: Therapeutic compression applied : 09/21/2015 Notes: Wound/Skin Impairment Nursing Diagnoses: Impaired tissue integrity Knowledge deficit related to ulceration/compromised skin integrity Goals: Patient will have a decrease in wound volume by X% from date: (specify in notes) Date Initiated: 04/05/2015 Date Inactivated: 02/12/2017 Target Resolution Date: 09/01/2016 Unmet Reason: chronic, unable to Goal Status: Unmet Goal Status: Unmet tolerate stronger compression Patient/caregiver will verbalize understanding of skin care regimen Date Initiated: 04/05/2015 Target Resolution Date: 11/04/2019 Goal Status: Active Interventions: Assess patient/caregiver ability to obtain necessary supplies Assess patient/caregiver ability to perform ulcer/skin care regimen upon admission and as needed Assess ulceration(s) every visit Provide education  on ulcer and skin care Notes: Electronic Signature(s) Signed: 10/07/2019 5:35:25 PM By: Baruch Gouty RN, BSN Entered By: Baruch Gouty on 10/07/2019  14:07:51 -------------------------------------------------------------------------------- Pain Assessment Details Patient Name: Date of Service: [name], [name] 10/07/2019 1:15 PM Medical Record Number: [id number] Patient Account Number: [id number] Date of Birth/Sex: Treating RN: [birth year] (64 y.o. [name] Nikayla Madaris: [name] Other Clinician: Referring Saman Umstead: Treating Raiquan Chandler/Extender: [name] in Treatment: 356 Active Problems Location of Pain Severity and Description of Pain Patient Has Paino No Site Locations Rate the pain. Current Pain Level: 0 Pain Management and Medication Current Pain Management: Medication: No Cold Application: No Rest: No Massage: No Activity: No T.E.N.S.: No Heat Application: No Leg drop or elevation: No Is the Current Pain Management Adequate: Adequate How does your wound impact your activities of daily livingo Sleep: No Bathing: No Appetite: No Relationship With Others: No Bladder Continence: No Emotions: No Bowel Continence: No Work: No Toileting: No Drive: No Dressing: No Hobbies: No Electronic Signature(s) Signed: 10/07/2019 4:59:21 PM By: Deon Pilling Signed: 10/07/2019 5:35:25 PM By: Baruch Gouty RN, BSN Entered By: Deon Pilling on 10/07/2019 13:30:20 -------------------------------------------------------------------------------- Patient/Caregiver Education Details Patient Name: Date of Service: [name], [name] 5/7/2021andnbsp1:15 PM Medical Record Number: [id number] Patient Account Number: [id number] Date of Birth/Gender: Treating RN: [birth year] (64 y.o. [name] Physician: [name] Other Clinician: Referring Physician: Treating Physician/Extender: [name] in Treatment: 356 Education Assessment Education Provided To: Patient Education Topics Provided Venous: Methods:  Explain/Verbal Responses: Reinforcements needed, State content correctly Wound/Skin Impairment: Methods: Explain/Verbal Responses: Reinforcements needed, State content correctly Electronic Signature(s) Signed: 10/07/2019 5:35:25 PM By: Baruch Gouty RN, BSN Entered By: Baruch Gouty on 10/07/2019 14:08:42 -------------------------------------------------------------------------------- Wound Assessment Details Patient Name: Date of Service: [name], [name] 10/07/2019 1:15 PM Medical Record Number: [id number] Patient Account Number: [id number] Date of Birth/Sex: Treating RN: [birth year] (64 y.o. [name] Garlene Apperson: [name] Other Clinician: Referring Owain Eckerman: Treating Kendal Raffo/Extender: [name] in Treatment: 356 Wound Status Wound Number: 3 Primary Venous Leg Ulcer Etiology: Wound Location: Left, Medial Malleolus Wound Open Wounding Event: Gradually Appeared Status: Date Acquired: 11/15/2012 Comorbid Congestive Heart Failure, Peripheral Vascular Disease, Congestive Weeks Of Treatment: 356 History: Heart Failure, End Stage Renal Disease, Tobacco Use, Chronic Clustered Wound: No Obstructive Pulmonary Disease (COPD), Type 1 Diabetes Wound Measurements Length: (cm) 1 Width: (cm) 0.8 Depth: (cm) 0.2 Area: (cm) 0.628 Volume: (cm) 0.126 Wound Description Classification: Full Thickness Without Exposed Support Structu Wound Margin: Flat and Intact Exudate Amount: Small Exudate Type: Serosanguineous Exudate Color: red, brown Foul Odor After Cleansing: Slough/Fibrino % Reduction in Area: 80% % Reduction in Volume: 79.9% Epithelialization: Medium (34-66%) Tunneling: No Undermining: No res No Yes Wound Bed Granulation Amount: Large (67-100%) Exposed Structure Granulation Quality: Red, Pink Fascia Exposed: No Necrotic Amount: None Present (0%) Fat Layer (Subcutaneous Tissue) Exposed: Yes Tendon Exposed:  No Muscle Exposed: No Joint Exposed: No Bone Exposed: No Treatment Notes Wound #3 (Left, Medial Malleolus) 1. Cleanse With Saline Soap and water 2. Periwound Care TCA Ointment 3. Primary Dressing Applied Hydrofera Blue 4. Secondary Dressing Dry Gauze Kerramax/Xtrasorb 6. Support Layer Best boy) Signed: 10/07/2019 4:59:21 PM By: Deon Pilling Signed: 10/07/2019 5:35:25 PM By: Baruch Gouty RN, BSN Entered By: Deon Pilling on 10/07/2019 13:32:56 -------------------------------------------------------------------------------- Vitals Details Patient Name: Date of Service: [name], [name] 10/07/2019 1:15  PM Medical Record Number: [id number] Patient Account Number: [id number] Date of Birth/Sex: Treating RN: 1956-01-03 (64 y.o. [name] Ryer Asato: [name] Other Clinician: Referring Shailee Foots: Treating Theodus Ran/Extender: [name] in Treatment: 356 Vital Signs Time Taken: 13:18 Temperature (F): 98 Height (in): 68 Pulse (bpm): 60 Weight (lbs): 127 Respiratory Rate (breaths/min): 18 Body Mass Index (BMI): 19.3 Blood Pressure (mmHg): 119/46 Reference Range: 80 - 120 mg / dl Electronic Signature(s) Signed: 10/07/2019 4:59:21 PM By: Deon Pilling Entered By: Deon Pilling on 10/07/2019 13:30:08

## 2019-10-07 NOTE — Progress Notes (Signed)
[name], [name] ([id number]) Visit Report for 10/07/2019 Debridement Details Patient Name: Date of Service: [facility] [place], [name] 10/07/2019 1:15 PM Medical Record Number: [id number] Patient Account Number: [id number] Date of Birth/Sex: Treating RN: [birth year] (64 y.o. [name] Provider: [name] Other Clinician: Referring Provider: Treating Provider/Extender: [name] in Treatment: 356 Debridement Performed for Assessment: Wound #3 Left,Medial Malleolus Performed By: Physician [name]., MD Debridement Type: Debridement Severity of Tissue Pre Debridement: Fat layer exposed Level of Consciousness (Pre-procedure): Awake and Alert Pre-procedure Verification/Time Out Yes - 14:05 Taken: Start Time: 14:07 Pain Control: Lidocaine 4% T opical Solution T Area Debrided (L x W): otal 1 (cm) x 0.8 (cm) = 0.8 (cm) Tissue and other material debrided: Viable, Non-Viable, Slough, Subcutaneous, Slough Level: Skin/Subcutaneous Tissue Debridement Description: Excisional Instrument: Curette Bleeding: Minimum Hemostasis Achieved: Pressure End Time: 14:09 Procedural Pain: 4 Post Procedural Pain: 1 Response to Treatment: Procedure was tolerated well Level of Consciousness (Post- Awake and Alert procedure): Post Debridement Measurements of Total Wound Length: (cm) 1 Width: (cm) 0.8 Depth: (cm) 0.1 Volume: (cm) 0.063 Character of Wound/Ulcer Post Debridement: Improved Severity of Tissue Post Debridement: Fat layer exposed Post Procedure Diagnosis Same as Pre-procedure Electronic Signature(s) Signed: 10/07/2019 4:59:38 PM By: Linton Ham MD Signed: 10/07/2019 5:35:25 PM By: Baruch Gouty RN, BSN Entered By: Linton Ham on 10/07/2019 14:16:16 -------------------------------------------------------------------------------- HPI Details Patient Name: Date of Service: [name], [name] 10/07/2019 1:15 PM Medical Record Number:  [id number] Patient Account Number: [id number] Date of Birth/Sex: Treating RN: 02-22-1956 (64 y.o. [name] Provider: [name] Other Clinician: Referring Provider: Treating Provider/Extender: [name] in Treatment: 356 History of Present Illness HPI Description: the remaining wound is over the left medial ankle. Similar wound over the right medial ankle healed largely with use of Apligraf. Most recently we have been using Hydrofera Blue over this wound with considerable improvement. The patient has been extensively worked up in the past for her venous insufficiency and she is not a candidate for antireflux surgery although I have none of the details available currently. 08/24/14; considerable improvement today. About 50% of this wound areas now epithelialized. The base of the wound appears to be healthier granulation.as opposed to last week when she had deteriorated a considerable improvement 08/17/14; unfortunately the wound has regressed somewhat. The areas of epithelialization from the superior aspect are not nearly as healthy as they were last week. The patient thinks her Hydrofera Blue slipped. 09/07/14; unfortunately the area has markedly regressed in the 2 weeks since I've seen this. There is an odor surrounding erythema. The healthy granulation tissue that we had at the base of the wound now is a dusky color. The nurse reports green drainage 09/14/14; the area looks somewhat better than last week. There is less erythema and less drainage. The culture I did did not show any growth. Nevertheless I think it is better to continue the Cipro and doxycycline for a further week. The remaining wound area was debridement. 09/21/14. Wound did not require debridement last week. Still less erythema and less drainage. She can complete her antibiotics. The areas of epithelialization in the superior aspect of the wound do not look as healthy as they did  some weeks ago 10/05/14 continued improvement in the condition of this wound. There is advancing epithelialization. Less aggressive debridement required 10/19/14 continued improvement in the condition and volume of this wound. Less aggressive debridement to the  inferior part of this to remove surface slough and fibrinous eschar 11/02/14 no debridement is required. The surface granulation appears healthy although some of her islands of epithelialization seem to have regressed. No evidence of infection 11/16/14; lites surface debridement done of surface eschar. The wound does not look to be unhealthy. No evidence of infection. Unfortunately the patient has had podiatry issues in the right foot and for some reason has redeveloped small surface ulcerations in the medial right ankle. Her original presentation involved wounds in this area 11/23/14 no debridement. The area on the right ankle has enlarged. The left ankle wound appears stable in terms of the surface although there is periwound inflammation. There has been regression in the amount of new skin 11/30/14 no debridement. Both wound areas appear healthy. There was no evidence of infection. The the new area on the right medial ankle has enlarged although that both the surfaces appear to be stable. 12/07/14; Debridement of the right medial ankle wound. No no debridement was done on the left. 12/14/14 no major change in and now bilateral medial ankle wounds. Both of these are very painful but the no overt evidence of infection. She has had previous venous ablation 12/21/14; patient states that her right medial ankle wound is considerably more painful last week than usual. Her left is also somewhat painful. She could not tolerate debridement. The right medial ankle wound has fibrinous surface eschar 12/28/14 this is a patient with severe bilateral venous insufficiency ulcers. For a considerable period of time we actually had the one on the right medial  ankle healed however this recently opened up again in June. The left medial ankle wound has been a refractory area with some absent flows. We had some success with Hydrofera Blue on this area and it literally closed by 50% however it is recently opened up Foley. Both of these were debridement today of surface eschar. She tolerates this poorly 01/25/15: No change in the status of this. Thick adherent escar. Very poor tolerance of any attempt at debridement. I had healed the right medial malleolus wound for a considerable amount of time and had the left one down to about 50% of the volume although this is totally regressed over the last 48 weeks. Further the right leg has reopened. she is trying to make a appointment with pain and vascular, previous ablations with Dr. Aleda Grana. I do not believe there is an arterial insufficiency issue here 02/01/15 the status of the adherent eschar bilaterally is actually improved. No debridement was done. She did not manage to get vascular studies done 02/08/15 continued debridement of the area was done today. The slough is less adherent and comes off with less pressure. There is no surrounding infection peripheral pulses are intact 02/15/15 selective debridement with a disposable curette. Again the slough is less adherent and comes off with less difficulty. No surrounding infection peripheral pulses are intact. 02/22/15 selective debridement of the right medial ankle wound. Slough comes off with less difficulty. No obvious surrounding infection peripheral pulses are intact I did not debridement the one on the left. Both of these are stable to improved 03/01/15 selective debridement of both wound areas using a curette to. Adherent slough cup soft with less difficulty. No obvious surrounding infection. The patient tells me that 2 days ago she noted a rash above the right leg wrap. She did not have this on her lower legs when she change this over she arrives  with widespread left greater than right almost folliculitis-looking  rash which is extremely pruritic. I don't see anything to culture here. There is no rash on the rest of her body. She feels well systemically. 03/08/15; selective debridement of both wounds using a curette. Base of this does not look unhealthy. She had limegreen drainage coming out of the left leg wound and describes a lot of drainage. The rash on her left leg looks improved to. No cultures were done. 03/22/15; patient was not here last week. Basal wounds does not look healthy and there is no surrounding erythema. No drainage. There is still a rash on the left leg that almost looks vasculitic however it is clearly limited to the top of where the wrap would be. 04/05/15; on the right required a surgical debridement of surface eschar and necrotic subcutaneous tissue. I did not debridement the area on the left. These continue to be large open wounds that are not changing that much. We were successful at one point in healing the area on the right, and at the same time the area on the left was roughly half the size of current measurements. I think a lot of the deterioration has to do with the prolonged time the patient is on her feet at work 04/19/15 I attempted-like surface debridement bilaterally she does not tolerate this. She tells me that she was in allergic care yesterday with extreme pain over her left lateral malleolus/ankle and was told that she has an "sprain" 05/03/15; large bilateral venous insufficiency wounds over the medial malleolus/medial aspect of her ankles. She complains of copious amounts of drainage and his usual large amounts of pain. There is some increasing erythema around the wound on the right extending into the medial aspect of her foot to. historically she came in with these wounds the right one healed and the left one came down to roughly half its current size however the right one is reopened and the left  is expanded. 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 05/10/15 large bilateral venous insufficiency wounds. There is less adherence surface left however the surface culture that I did last week grew pseudomonas therefore bilateral selective debridement score necessary. There is surrounding erythema. The patient describes severe bilateral drainage and a lot of pain in the left ankle. Apparently her podiatrist was were ready to do a cortisone shot 05/17/15; the patient complains of pain and again copious amounts of drainage. 05/24/15; we used Iodo flex last week. Patient notes considerable improvement in wound drainage. Only needed to change this once. 05/31/15; we continued Iodoflex; the base of these large wounds bilaterally is not too bad but there is probably likely a significant bioburden here. I would like to debridement just doesn't tolerate it. 06/06/14 I would like to continue the Iodoflex although she still hasn't managed to obtain supplies. She has bilateral medial malleoli or large wounds which are mostly superficial. Both of them are covered circumferentially with some nonviable fibrinous slough although she tolerates debridement very poorly. She apparently has an appointment for an ablation on the right leg by interventional radiology. 06/14/15; the patient arrives with the wounds and static condition. We attempted a debridement although she does not do well with this secondary to pain. I 07/05/15; wounds are not much smaller however there appears to be a cleaner granulating base. The left has tight fibrinous slough greater than the right. Debridement is tolerated poorly due to pain. Iodoflex is done more for these wounds  in any of the multitude of different dressings I have tried on the left 1 and then subsequently the right. 07/12/15; no change in the condition of this wound. I am able to do an aggressive debridement on the right but not the  left. She simply cannot tolerate it. We have been using Iodoflex which helps somewhat. It is worthwhile remembering that at one point we healed the right medial ankle wound and the left was about 25% of the current circumference. We have suggested returning to vascular surgery for review of possible further ablations for one reason or another she has not been able to do this. 07/26/15 no major change in the condition of either wound on her medial ankle. I did not attempt to debridement of these. She has been aggressively scrubbing these while she is in the shower at home. She has her supply of Iodoflex which seems to have done more for these wounds then anything I have put on recently. 08/09/15 wound area appears larger although not verified by measurements. Using Iodoflex 09/05/2015 -- she was here for avisit today but had significant problems with the wound and I was asked to see her for a physician opinion. I have summarize that this lady has had surgery on her left lower extremity about 10 years ago where the possible veins stripping was done. She has had an opinion from interventional radiology around November 2016 where no further sclerotherapy was ordered. The patient works 12 hours a day and stands on a concrete floor with work boots and is unable to get the proper compression she requires and cannot elevate her limbs appropriately at any given time. She has recently grown Pseudomonas from her wound culture but has not started her ciprofloxacin which was called in for her. 09/13/15 this continues to be a difficult situation for this patient. At one point I had this wound down to a 1.5 x 1.5" wound on her left leg. This is deteriorated and the right leg has reopened. She now has substantial wounds on her medial calcaneus, malleoli and into her lower leg. One on the left has surface eschar but these are far too painful for me to debridement here. She has a vascular surgery appointment next week to  see if anything can be done to help here. I think she has had previous ablations several years ago at [facility]. She has no major edema. She tells me that she did not get product last time [facility]] and went for several days without it. She continues to work in work boots 12 hours a day. She cannot get compression/4-layer under her work boots. 09/20/15 no major change. Periwound edema control was not very good. Her point with pain and vascular is next Wednesday the 25th 09/28/15; the patient is seen vascular surgery and is apparently scheduled for repeat duplex ultrasounds of her bilateral lower legs next week. 10/05/15; the patient was seen by Dr. Doren Custard of vascular surgery. He feels that she should have arterial insufficiency excluded as cause/contributed to her nonhealing stage she is therefore booked for an arteriogram. She has apparently monophasic signals in the dorsalis pedis pulses. She also of course has known severe chronic venous insufficiency with previous procedures as noted previously. I had another long discussion with the patient today about her continuing to work 12 hour shifts. I've written her out for 2 months area had concerns about this as her work location is currently undergoing significant turmoil and this may lead to her termination. She  is aware of this however I agree with her that she simply cannot continue to stand for 12 hours multiple days a week with the substantial wound areas she has. 10/19/15; the Dr. Doren Custard appointment was largely for an arteriogram which was normal. She does not have an arterial issue. He didn't make a comment about her chronic venous insufficiency for which she has had previous ablations. Presumably it was not felt that anything additional could be done. The patient is now out of work as I prescribed 2 weeks ago. Her wounds look somewhat less aggravated presumably because of this. I felt I would give debridement another try today 10/25/15; no major  change in this patient's wounds. We are struggling to get her product that she can afford into her own home through her insurance. 11/01/15; no major change in the patient's wounds. I have been using silver alginate as the most affordable product. I spoke to Dr. Marla Roe last week with her requested take her to the OR for surgical debridement and placement of ACEL. Dr. Marla Roe told me that she would be willing to do this however [facility] will not cover this, fortunately the patient has [place] healthcare of some variant 11/08/15; no major change in the patient's wounds. She has been completely nonviable surface that this but is in too much pain with any attempted debridement are clinic. I have arranged for her to see Dr. Marla Roe ham of plastic surgery and this appointment is on Monday. I am hopeful that they will take her to the OR for debridement, possible ACEL ultimately possible skin graft 11/22/15 no major change in the patient's wounds over her bilateral medial calcaneus medial malleolus into the lower legs. Surface on these does not look too bad however on the left there is surrounding erythema and tenderness. This may be cellulitis or could him sleepy tinea. 11/29/15; no major changes in the patient's wounds over her bilateral medial malleolus. There is no infection here and I don't think any additional antibiotics are necessary. There is now plan to move forward. She sees Dr. Marla Roe in a week's time for preparation for operative debridement and ACEL placement I believe on 7/12. She then has a follow-up appointment with Dr. Marla Roe on 7/21 12/28/15; the patient returns today having been taken to the [facility] by Dr. Marla Roe 12/12/15 she underwent debridement, intraoperative cultures [which were negative]. She had placement of a wound VAC. Parent really ACEL was not available to be placed. The wound VAC foam apparently adhered to the wound since then she's been using silver  alginate, Xeroform under Ace wraps. She still says there is a lot of drainage and a lot of pain 01/31/16; this is a patient I see monthly. I had referred her to Dr. Marla Roe him of plastic surgery for large wounds on her bilateral medial ankles. She has been to the OR twice once in early July and once in early August. She tells me over the last 3 weeks she has been using the wound VAC with ACEL underneath it. On the right we've simply been using silver alginate. Under Kerlix Coban wraps. 02/28/16; this is a patient I'm currently seeing monthly. She is gone on to have a skin graft over her large venous insufficiency ulcer on the left medial ankle. This was done by Dr. Marla Roe him. The patient is a bit perturbed about why she didn't have one on her right medial ankle wound. She has been using silver alginate to this. 03/06/16; I received a  phone call from her plastic surgery Dr. Marla Roe. She expressed some concern about the viability of the skin graft she did on the left medial ankle wound. Asked me to place Endoform on this. She told me she is not planning to do a subsequent skin graft on the right as the left one did not take very well. I had placed Hydrofera Blue on the right 03/13/16; continue to have a reasonably healthy wound on the right medial ankle. Down to 3 mm in terms of size. There is epithelialization here. The area on the left medial ankle is her skin graft site. I suppose the last week this looks somewhat better. She has an open area inferiorly however in the center there appears to be some viable tissue. There is a lot of surface callus and eschar that will eventually need to come off however none of this looked to be infected. Patient states that the is able to keep the dressing on for several days which is an improvement. 03/20/16 no major change in the circumference of either wound however on the left side the patient was at Dr. Eusebio Friendly office and they did a debridement of  left wound. 50% of the wound seems to be epithelialized. I been using Endoform on the left Hydrofera Blue in the right 03/27/16; she arrives today with her wound is not looking as healthy as they did last week. The area on the right clearly has an adherent surface to this a very similar surface on the left. Unfortunately for this patient this is all too familiar problem. Clearly the Endoform is not working and will need to change that today that has some potential to help this surface. She does not tolerate debridement in this clinic very well. She is changing the dressing wants 04/03/16; patient arrives with the wounds looking somewhat better especially on the right. Dr. Migdalia Dk change the dressing to silver alginate when she saw her on Monday and also sold her some compression socks. The usefulness of the latter is really not clear and woman with severely draining wounds. 04/10/16; the patient is doing a bit of an experiment wearing the compression stockings that Dr. Migdalia Dk provided her to her left leg and the out of legs based dressings that we provided to the right. 05/01/16; the patient is continuing to wear compression stockings Dr. Migdalia Dk provided her on the left that are apparently silver impregnated. She has been using Iodoflex to the right leg wound. Still a moderate amount of drainage, when she leaves here the wraps only last for 4 days. She has to change the stocking on the left leg every night 05/15/16; she is now using compression stockings bilaterally provided by Dr. Marla Roe. She is wearing a nonadherent layer over the wounds so really I don't think there is anything specific being done to this now. She has some reduction on the left wound. The right is stable. I think all healing here is being done without a specific dressing 06/09/16; patient arrives here today with not much change in the wound certainly in diameter to large circular wounds over the medial aspect of her  ankle bilaterally. Under the light of these services are certainly not viable for healing. There is no evidence of surrounding infection. She is wearing compression stockings with some sort of silver impregnation as prescribed by Dr. Marla Roe. She has a follow-up with her tomorrow. 06/30/16; no major change in the size or condition of her wounds. These are still probably covered with a nonviable  surface. She is using only her purchase stockings. She did see Dr. Marla Roe who seemed to want to apply Dakin's solution to this I'm not extreme short what value this would be. I would suggest Iodoflex which she still has at home. 07/28/16; I follow Mrs. Taaffe episodically along with Dr. Marla Roe. 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 She has been applying a topical collagen ointment to both wounds with Adaptic. I don't think Dr. Marla Roe is planning to take her back to the OR. 08/19/16; I follow Mrs. Jeneen Rinks on a monthly basis along with Dr. Marla Roe of plastic surgery. 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 I been following her for a number of years. At one point I was able to get the right medial malleolus wound to heal and had the left medial malleolus down to about half its current size however and I had to send her to plastic surgery for an operative debridement. Since then things have been stable to slightly improve the area on the right is slightly better one in the left about the same although there is much less adherent surface than I'm used to with this patient. She is using some form of liquid collagen gel that Dr. Marla Roe provided a Kerlix cover with the patient's own pressure stockings. She tells me that she has extreme pain in both ankles and along the lateral aspect of both feet. She has been unable to work for some period of time. She is telling  me she is retiring at the beginning of April. She sees Dr. Doran Durand of orthopedics next week 09/22/16; patient has not seen Dr. Marla Roe since the last time she is here. I'm not really sure what she is using to the wounds other than bits and pieces of think she had left over including most recently Hydrofera Blue. She is using juxtalite stockings. She is having difficulty with her husband's recent illness "stroke". She is having to transport him to various doctors appointments. Dr. Marla Roe left her the option of a repeat debridement with ACEL however she has not been able to get the time to follow-up on this. She continues to have a fair amount of drainage out of these wounds with certainly precludes leaving dressings on all week 10/13/16; patient has not seen Dr. Marla Roe since she was last in our clinic. I'm not really sure what she is doing with the wounds, we did try to get her [facility] and I think she is actually using this most of the time. Because of drainage she states she has to change this every second day although this is an improvement from what she used to do. She went to see Dr. Doran Durand who did not think she had a muscular issue with regards to her feet, he referred her to a neurologist and I think the appointment is sometime in June. I changed her back to Iodoflex which she has used in the past but not recently. 11/03/16; the patient has been using Iodoflex although she ran out of this. Still claims that there is a lot of drainage although the wound does not look like this. No surrounding erythema. She has not been back to see Dr. Marla Roe 11/24/16; the patient has been using Iodoflex again but she ran out of it 2 or 3 days ago. There is no major change in the condition of either one of these wounds in fact  they are larger and covered in a thick adherent surface slough/nonviable tissue especially on the left. She does not tolerate mechanical debridement in our clinic. Going back  to see Dr. Marla Roe of plastic surgery for an operative debridement would seem reasonable. 12/15/16; the patient has not been back to see Dr. Marla Roe. She is been dealing with a series of illnesses and her husband which of monopolized her time. She is been using Sorbact which we largely supplied. She states the drainage is bad enough that it maximum she can go 2-3 days without changing the dressing 01/12/2017 -- the patient has not been back for about 4 weeks and has not seen Dr. Marla Roe not does she have any appointment pending. 01/23/17; patient has not seen Dr. Marla Roe even though I suggested this previously. She is using Santyl that was suggested last week by Dr. Con Memos this Cost her $16 through her insurance which is indeed surprising 02/12/17; continuing Santyl and the patient is changing this daily. A lot of drainage. She has not been back to see plastic surgery she is using an Ace wrap. Our intake nurse suggested wrap around stockings which would make a good reasonable alternative 02/26/17; patient is been using Santyl and changing this daily due to drainage. She has not been to see plastic surgery she uses in April Ace wrap to control the edema. She did obtain extremitease stockings but stated that the edema in her leg was to big for these 03/20/17; patient is using Santyl and Anasept. Surfaces looked better today the area on the right is actually measuring a little smaller. She has states she has a lot of pain in her feet and ankles and is asking for a consult to pain control which I'll try to help her with through our case manager. 04/10/17; the patient arrives with better-looking wound surfaces and is slightly smaller wound on the left she is using a combination of Santyl and Anasept. She has an appointment or at least as started in the pain control center associated with [facility] 05/14/17; this is a patient who I followed for a prolonged period of time. 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 At one point I had this down to a much smaller wound on the left however these reopened and we've never been able to get these to heal. She has been using Santyl and Anasept gel although 2 weeks ago she ran out of the Anasept gel. She has a stable appearance of the wound. She is going to the wound care clinic at [facility]. They wanted do a nerve block/spinal block although she tells me she is reluctant to go forward with that. 05/21/17; this is a patient I have followed for many years. 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 Chronic pain and deformity in her ankles as well. She is been to see plastic surgery as well as orthopedics. Using PolyMem AG most recently/Kerramax/ABDs and 2 layer compression. She has managed to keep this on and she is coming in for a nurse check to change the dressing on Tuesdays, we see her on Fridays 06/05/17; really quite a good looking surface and the area especially on the right medial has contracted in terms of dimensions. Well granulated healthy-looking tissue on both sides. Even with an open curet there is nothing that even feels abnormal here. This is as good as I've seen this in quite some time. We have been using PolyMem AG and bringing her in  for a nurse check 06/12/17; really quite good surface on both of these wounds. The right medial has contracted a bit left is not. We've been using PolyMem and AG and she is coming in for a nurse visit 06/19/17; we have been using PolyMem AG and bringing her in for a nurse check. Dimensions of her wounds are not better but the surfaces looked better bilaterally. She complained of bleeding last night and the left wound and increasing pain bilaterally. She states her wound pain is more neuropathic than just the wounds. There was some suggestion that this was radicular from her pain management doctor in talking to her it is really difficult to sort  this out. 06/26/17; using PolyMem and AG and bringing her in for a nurse check as All of this and reasonably stable condition. Certainly not improved. The dimensions on the lateral part of the right leg look better but not really measuring better. The medial aspect on the left is about the same. 07/03/16; we have been using PolyMen AG and bringing her in for a nurse check to change the dressings as the wounds have drainage which precludes once weekly changing. We are using all secondary absorptive dressings.our intake nurse is brought up the idea of using a wound VAC/snap VAC on the wound to help with the drainage to see if this would result in some contraction. This is not a bad idea. The area on the right medial is actually looking smaller. Both wounds have a reasonable-looking surface. There is no evidence of cellulitis. The edema is well controlled 07/10/17; the patient was denied for a snap VAC by her insurance. The major issue with these wounds continues to be drainage. We are using wicked PolyMem AG and she is coming in for a nurse visit to change this. The wounds are stable to slightly improved. The surface looks vibrant and the area on the right certainly has shrunk in size but very slowly 07/17/17; the patient still has large wounds on her bilateral medial malleoli. Surface of both of these wounds looks better. The dimensions seem to come and go but no consistent improvement. There is no epithelialization. We do not have options for advanced treatment products due to insurance issues. They did not approve of the wound VAC to help control the drainage. More recently we've been using PolyMem and AG wicked to allow drainage through. We have been bringing her in for a nurse visit to change this. We do not have a lot of options for wound care products and the home again due to insurance issues 07/24/17; the patient's wound actually looks somewhat better today. No drainage measurements are smaller still  healthy-looking surface. We used silver collagen under PolyMen started last week. We have been bringing her in for a dressing change 07/31/17; patient's wound surface continued to look better and I think there is visible change in the dimensions of the wound on the right. Rims of epithelialization. We have been using silver collagen under PolyMen and bringing her in for a dressing change. There appears to be less drainage although she is still in need of the dressing change 08/07/17. Patient's wound surface continues to look better on both sides and the area on the right is definitely smaller. We have been using silver collagen and PolyMen. She feels that the drainage has been it has been better. I asked her about her vascular status. She went to see Dr. Aleda Grana at [facility] and had some form of ablation. I don't  have much detail on this. I haven't my notes from 2016 that she was not a candidate for any further ablation but I don't have any more information on this. We had referred her to vein and vascular I don't think she ever went. He does not have a history of PAD although I don't have any information on this either. We don't even have ABIs in our record 08/14/17; we've been using silver collagen and PolyMen cover. And putting the patient and compression. She we are bringing her in as a nurse visit to change this because ofarge amount of drainage. We didn't the ABIs in clinic today since they had been done in many moons 1.2 bilaterally. She has been to see vein and vascular however this was at [facility] and she had ablation although I really don't have any information on this all seemed biking get a report. She is also been operatively debrided by plastic surgery and had a cell placed probably 8-12 months ago. This didn't have a major effect. We've been making some gains with current dressings 08/19/17-She is here in follow-up evaluation for bilateral medial malleoli ulcers. She continues  to tolerate debridement very poorly. We will continue with recently changed topical treatment; if no significant improvement may consider switching to Iodosorb/Iodoflex. 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 She has been using silver collagen and PolyMem. I believe she has been used and tried on Iodoflex before. 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 These have been very refractory over time. We have been using silver collagen and PolyMen. She is coming in once a week for a doctor's and once a week for nurse visits. We are actually making some progress 09/18/17; the patient's wounds are smaller especially on the right medial. She arrives today to upset to consider even washing these off with Anasept which I think is been part of the reason this is been closing. We've been using collagen covered in PolyMen otherwise. It is noted that she has a small area of folliculitis on the right medial calf that. As we are wrapping her legs I'll give her a short course of doxycycline to make sure this doesn't amount to anything. She is a long list of complaints today including imbalance, shortness of breath on exertion, inversion of her left ankle. With regards to the latter complaints she is been to see orthopedics and they offered her a tendon release surgery I believe but wanted her wounds to be closed first. I have recommended she go see her primary physician with regards to everything else. 09/25/17; patient's wounds are about the same size. We have made some progress bilaterally although not in recent weeks. She will not allow me T wash these wounds with Anasept even if she is doing her cell. Wheeze we've been using collagen covered in PolyMen. Last week she had a  small area of folliculitis this is now opened into a small wound. She completed 5 days of trimethoprim sulfamethoxazole 10/02/17; unfortunately the area on her left medial ankle is worse with a larger wound area towards the Achilles. The patient complains of a lot of pain. She will not allow debridement although visually I don't think there is anything to debridement in any case. We have been using silver collagen and PolyMen  for several months now. Initially we are making some progress although I'm not really seeing that today. We will move back to [facility]. His admittedly this is a bit of a repeat however I'm hoping that his situation is different now. The patient tells me she had her leg on the left give out on her yesterday this is process some pain. 10/09/17; the patient is seen twice a week largely because of drainage issues coming out of the chronic medial bimalleolar wounds that are chronic. Last week the dimensions of the one on the left looks a little larger I changed her to [facility]. She comes in today with a history of terrible pain in the bilateral wound areas. She will not allow debridement. She will not even allow a tissue culture. There is no surrounding erythema no no evidence of cellulitis. We have been putting her Kerlix Coban man. She will not allow more aggressive compression as there was a suggestion to put her in 3 layer wraps. 10/16/17; large wounds on her bilateral medial malleoli. These are chronic. Not much change from last week. The surface looks have healthy but absolutely no epithelialization. A lot of pain little less so of drainage. She will not allow debridement or even washing these off in the vigorous fashion with Anasept. 10/23/17; large wounds on her bilateral malleoli which are chronic. Some improvement in terms of size perhaps on the right since last time I saw these. She states that after we increased the 3 layer compression there was some bleeding, when  she came in for a nurse visit she did not want 3 layer compression put back on about our nurse managed to convince her. She has known chronic venous visit issues and I'm hoping to get her to tolerate the 3 layer compression. using Hydrofera Blue 10/30/17; absolutely no change in the condition of either wound although we've had some improvement in dimensions on the right.. Attempted to put her in 3 layer compression she didn't tolerated she is back in 2 layer compression. We've been using Hydrofera Blue We looked over her past records. She had venous reflux studies in November 2016. There was no evidence of deep venous reflux on the right. Superficial vein did not show the greater saphenous vein at think this is been previously ablated the small saphenous vein was within normal limits. The left deep venous system showed no DVT the vessels were positive for deep venous reflux in the posterior tibial veins at the ankle. The greater saphenous vein was surgically absent small saphenous vein was within normal limits. She went to vein and vascular at [facility]. I believe she had an ablation on the left greater saphenous vein. I'll update her reflux studies perhaps ever reviewed by vein and vascular. We've made absolutely no progress in these wounds. Will also try to read and TheraSkins through her insurance 11/06/17; W the patient apparently has a 2 week follow-up with vein and vascular I like him to review the whole issue with regards to her previous vascular workup by Dr. Aleda Grana. We've really made no progress on these wounds in many months. She arrives today with less viable looking surface on the left medial ankle wound. This was apparently looking about the same on Tuesday when she was here for nurse visit. 11/13/17; deep tissue culture I did last time of the left lower leg showed multiple organisms without any predominating. In particular no Staphylococcus or group A strep were isolated. We sent  her for venous  reflux studies. She's had a previous left greater saphenous vein stripping and I think sclerotherapy of the right greater saphenous vein. She didn't really look at the lesser saphenous vein this both wounds are on the medial aspect. She has reflux in the common femoral vein and popliteal vein and an accessory vein on the right and the common femoral vein and popliteal vein on the left. I'm going to have her go to see vein and vascular just the look over things and see if anything else beside aggressive compression is indicated here. We have not been able to make any progress on these wounds in spite of the fact that the surface of the wounds is never look too bad. 11/20/17; no major change in the condition of the wounds. Patient reports a large amount of drainage. She has a lot of complaints of pain although enlisting her today I wonder if some of this at least his neuropathic rather than secondary to her wounds. She has an appointment with vein and vascular on 12/30/17. The refractory nature of these wounds in my mind at least need vein and vascular to look over the wounds the recent reflux studies we did and her history to see if anything further can be done here. I also note her gait is deteriorated quite a bit. 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 She has a bilateral Trendelenburg gait. I wonder if this is neuropathic or perhaps multilevel radicular. 11/27/17; her wounds actually looks slightly better. Healthy-looking granulation tissue a scant amount of epithelialization. [place] healthcare will not pay for [employer]. They will play for tri layer Oasis and Dermagraft. This is not a diabetic ulcer. We'll try for the tri layer Oasis. She still complains of some drainage. She has a vein and vascular appointment on 12/30/17 12/04/17; the wounds visually look quite good. Healthy-looking granulation with some degree of epithelialization. We are still waiting for response to our  request for trial to try layer Oasis. Her appointment with vascular to review venous and arterial issues isn't sold the end of July 7/31. Not allow debridement or even vigorous cleansing of the wound surface. 12/18/17; slightly smaller especially on the right. 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 We've been using Hydrofera Blue. We still are looking into triple layer Oasis through her insurance 01/08/18 on evaluation today patient's wound actually appears to be showing signs of good improvement at this point in time. She has been tolerating the dressing changes without complication. Fortunately there does not appear to be any evidence of infection at this point in time. We have been utilizing silver nitrate which does seem to be of benefit for her which is also good news. Overall I'm very happy with how things seem to be both regards appearance as well as measurement. Patient did see Dr. Bridgett Larsson for evaluation on 12/30/17. In his assessment he felt that stripping would not likely add much more than chronic compression to the patient's healing process. His recommendation was to follow-up in three months with Dr. Doren Custard if she hasn't healed in order to consider referral back to you and see vascular where she previously was in a trial and was able to get her wound to heal. I'll be see what she feels she when you staying compression and he reiterated this as well. 01/13/18 on evaluation today patient appears to actually be doing very well in regard to her bilateral medial malleolus ulcers. She seems to have tolerated the chemical cauterization with  silver nitrate last week she did have some pain through that evening but fortunately states that I'll be see since it seems to be doing better she is overall pleased with the progress. 01/21/18; really quite a remarkable improvement since I've last seen these wounds. We started using silver nitrate specially on the islands of hyper  granulation which for some reason her around the wound circumference. This is really done quite nicely. Primary dressing Hydrofera Blue under 4 layer compression. She seems to be able to hold out without a nurse rewrap. Follow-up in 1 week 01/28/18; we've continued the hydrofera blue but continued with chemical cauterization to the wound area that we started about a month ago for irregular hyper granulation. She is made almost stunning improvement in the overall wound dimensions. I was not really expecting this degree of improvement in these chronic wounds 02/05/18; we continue with Hydrofera Bluebut of also continued the aggressive chemical cauterization with silver nitrate. We made nice progress with the right greater than left wound. 02/12/18. We continued with Hydrofera Blue after aggressive chemical cauterization with silver nitrate. We appear to be making nice progress with both wound areas 02/19/2018; we continue with [facility] after washing the wounds vigorously with Anasept spray and chemical cauterization with silver nitrate. We are making excellent progress. The area on the right's just about closed 02/26/2018. The area on the left medial ankle had too much necrotic debris today. I used a #5 curette we are able to get most of the soft. I continued with the silver nitrate to the much smaller wound on the right medial ankle she had a new area on her right lower pretibial area which she says was due to a role in her compression 03/05/2018; both wound areas look healthy. Not much change in dimensions from last week. I continue to use silver nitrate and Hydrofera Blue. The patient saw Dr. Doren Custard of vein and vascular. He felt she had venous stasis ulcers. He felt based on her previous arteriogram she should have adequate circulation for healing. Also she has deep venous reflux but really no significant correctable superficial venous reflux at this time. He felt we should continue with  conservative management including leg elevation and compression 04/02/2018; since we last saw this woman about a month ago she had a fall apparently suffered a pelvic fracture. I did not look up the x-ray. Nevertheless because of pain she literally was bedbound for 2 weeks and had home health coming out to change the dressing. Somewhat predictably this is resulted in considerable improvement in both wound areas. The right is just about closed on the medial malleolus and the left is about half the size. 04/16/2018; both her wounds continue to go down in size. Using Hydrofera Blue. 05/07/18; both her wounds appeared to be improving especially on the right where it is almost closed. We are using Hydrofera Blue 05/14/2018; slightly worse this week with larger wounds. Surface on the left medial not quite as good. We have been using Hydrofera Blue 05/21/18; again the wounds are slightly larger. Left medial malleolus slightly larger with eschar around the circumference. We have been using Hydrofera Blue undergoing a wraps for a prolonged period of time. This got a lot better when she was more recumbent due to a fall and a back injury. I change the primary dressing the silver alginate today. She did not tolerate a 4 layer compression previously although I may need to bring this up with her next time 05/28/2018; area on  the left medial malleolus again is slightly larger with more drainage. Area on the right is roughly unchanged. She has a small area of folliculitis on the right medial just on the lower calf. This does not look ominous. 06/03/2018 left medial malleolus slightly smaller in a better looking surface. We used silver nitrate on this last time with silver alginate. The area on the right appears slightly smaller 1/10; left medial malleolus slightly smaller. Small open area on the right. We used silver nitrate and silver alginate as of 2 weeks ago. We continue with the wound and compression. These got a  lot better when she was off her feet 1/17; right medial malleolus wound is smaller. The left may be slightly smaller. Both surfaces look somewhat better. 1/24; both wounds are slightly smaller. Using silver alginate under Unna boots 1/31; both wounds appear smaller in fact the area on the right medial is just about closed. Surface eschar. We have been using silver alginate under Unna boots. The patient is less active now spends let much less time on her feet and I think this is contributed to the general improvement in the wound condition 2/7; both wounds appear smaller. I was hopeful the right medial would be closed however there there is still the same small open area. Slight amount of surface eschar on the left the dimensions are smaller there is eschar but the wound edges appear to be free. We have been using silver alginate under Unna boot's 2/14; both wounds once again measure smaller. Circumferential eschar on the left medial. We have been using silver alginate under Unna boots with gradual improvement 2/21; the area on the right medial malleolus has healed. The area on the left is smaller. We have been using silver alginate and Unna boots. We can discharge wrapping the right leg she has 20/30 stockings at home she will need to protect the scar tissue in this area 2/28; the area on the right medial malleolus remains closed the patient has a compression stocking. The area on the left is smaller. We have been using silver alginate and Unna boots. 3/6 the area on the right medial ankle remains closed. Good edema control noted she is using her own compression stocking. The area on the left medial ankle is smaller. We have been managing this with silver alginate and Unna boots which we will continue today. 3/13; the area on the right medial ankle remains closed and I'm declaring it healed today. When necessary the left is about the same still a healthy-looking surface but no major change and wound  area. No evidence of infection and using silver alginate under unna and generally making considerable improvement 3/27 the area on the right medial ankle remains closed the area on the left is about the same as last week. Certainly not any worse we have been using silver alginate under an Unna boot 4/3; the area on the right medial ankle remains closed per the patient. We did not look at this wound. The wound on the left medial ankle is about the same surface looks healthy we have been using silver alginate under an Unna boot 4/10; area on the right medial ankle remains closed per the patient. We did not look at this wound. The wound on the left medial ankle is slightly larger. The patient complains that the [facility] caused burning pain all week. She also told us that she was a lot more active this week. Changed her back to silver alginate 4/17; right  medial ankle still closed per the patient. Left medial ankle is slightly larger. Using silver alginate. She did not tolerate Hydrofera Blue on this area 4/24; right medial ankle remains closed we have not look at this. The left medial ankle continues to get larger today by about a centimeter. We have been using silver alginate under Unna boots. She complains about 4 layer compression as an alternative. She has been up on her feet working on her garden 5/8; right medial ankle remains closed we did not look at this. The left medial ankle has increased in size about 100%. We have been using silver alginate under Unna boots. She noted increased pain this week and was not surprised that the wound is deteriorated 5/15; no major change in SA however much less erythema ( one week of doxy ocellulitis). 5/22-65 year old female returns at 1 week to the clinic for left medial ankle wound for which we have been using silver alginate under 3 layer compression She was placed on DOXY at last visit - the wound is wider at this visit. She is in 3 layer  compression 5/29; change to [facility] last week. I had given her empiric doxycycline 2 weeks ago for a week. She is in 3 layer compression. She complains of a lot of pain and drainage on presentation today. 6/5; using Hydrofera Blue. I gave her doxycycline recently empirically for erythema and pain around the wound. Believe her cultures showed enterococcus which not would not have been well covered by doxycycline nevertheless the wound looks better and I don't feel specifically that the enterococcus needs to be covered. She has a new what looks like a wrap injury on her lateral left ankle. 6/12; she is using Hydrofera Blue. She has a new area on the left anterior lower tibial area. This was a wrap injury last week. 6/19; the patient is using Hydrofera Blue. She arrived with marked inflammation and erythema around the wound and tenderness. 12/01/18 on evaluation today patient appears to be doing a little bit better based on what I'm hearing from the standpoint of lassos evaluation to this as far as the overall appearance of the wound is concerned. Then sometime substandard she typically sees Dr. Dellia Nims. Nonetheless overall very pleased with the progress that she's made up to this point. No fevers, chills, nausea, or vomiting noted at this time. 7/10; some improvement in the surface area. Aggressively debrided last week apparently. I went ahead with the debridement today although the patient does not tolerate this very well. We have been using Iodoflex. Still a fair amount of drainage 7/17; slightly smaller. Using Iodoflex. 7/24; no change from last week in terms of surface area. We have been using Iodoflex. Surface looks and continues to look somewhat better 7/31; surface area slightly smaller better looking surface. We have been using Iodoflex. This is under Unna boot compression 8/7-Patient presents at 1 week with Unna boot and Iodoflex, wound appears better 8/14-Patient presents at 1 week with  Iodoflex, we use the Unna boot, wound appears to be stable better.Patient is getting Botox treatment for the inversion of the foot for tendon release, Next week 8/21; we are using Iodoflex. Unna boot. The wound is stable in terms of surface area. Under illumination there is some areas of the wound that appear to be either epithelialized or perhaps this is adherent slough at this point I was not really clear. It did not wipe off and I was reluctant to debride this today. 8/28; we are using  Iodoflex in an Haematologist. Seems to be making good improvement. 9/4; using Iodoflex and wound is slightly smaller. 9/18; we are using Iodoflex with topical silver nitrate when she is here. The wound continues to be smaller 10/2; patient missed her appointment last week due to GI issues. She left and Iodoflex based dressing on for 2 weeks. Wound is about the same size about the size of a dime on the left medial lower 10/9 we have been using Iodoflex on the medial left ankle wound. She has a new superficial probable wrap injury on the dorsal left ankle 10/16; we have been using Hydrofera Blue since last week. This is on the left medial ankle 10/23; we have been using Hydrofera Blue since 2 weeks ago. This is on the left medial ankle. Dimensions are better 11/6; using Hydrofera Blue. I think the wound is smaller but still not closed. Left medial ankle 11/13; we have been using Hydrofera Blue. Wound is certainly no smaller this week. Also the surface not as good. This is the remanent of a very large area on her left medial ankle. 11/20; using Sorbact since last week. Wound was about the same in terms of size although I was disappointed about the surface debris 12/11; 3-week follow-up. Patient was on vacation. Wound is measuring slightly larger we have been using Sorbact. 12/18; wound is about the same size however surface looks better last week after debridement. We have been using Sorbact under compression 1/15 wound  is probably twice the size of last time increased in length nonviable surface. We have been using Sorbact. She was running a mild fever and missed her appointment last week 1/22; the wound is come down in size but under illumination still a very adherent debris we have been Hydrofera Blue that I changed her to last week 1/29; dimensions down slightly. We have been using Hydrofera Blue 2/19 dimensions are the same however there is rims of epithelialization under illumination. Therefore more the surface area may be epithelialized 2/26; the patient's wound actually measures smaller. The wound looks healthy. We have been using Hydrofera Blue. I had some thoughts about running Apligraf then I still may do that however this looks so much better this week we will delay that for now 3/5; the wound is small but about the same as last week. We have been using Hydrofera Blue. No debridement is required today. 3/19; the wound is about the size of a dime. Healthy looking wound even under illumination. We have been using Hydrofera Blue. No mechanical debridement is necessary 3/26; not much change from last week although still looks very healthy. We have been using Hydrofera Blue under Unna boots Patient was offered an ankle fusion by podiatry but not until the wound heals with a proceed with this. 4/9; the patient comes in today with her original wound on the medial ankle looking satisfactory however she has some uncontrolled swelling in the middle part of her leg with 2 new open areas superiorly just lateral to the tibia. I think this was probably a wrap issue. She said she felt uncomfortable during the week but did not call in. We have been using Hydrofera Blue 4/16; the wound on the medial ankle is about the same. She has innumerable small areas superior to this across her mid tibia. I think this is probably folliculitis. She is also been working in the yard doing a lot of sweating 4/30; the patient issue on  the upper areas across her mid tibia  of all healed. I think this was excessive yard work if I remember. Her wound on the medial ankle is smaller. Some debris on this we have been using Hydrofera Blue under Unna boots 5/7; mid tibia. She has been using Hydrofera Blue under an Unna wrap. She is apparently going for her ankle surgery on June 3 Electronic Signature(s) Signed: 10/07/2019 4:59:38 PM By: Linton Ham MD Entered By: Linton Ham on 10/07/2019 14:17:19 -------------------------------------------------------------------------------- Physical Exam Details Patient Name: Date of Service: [name], [name] Kingfisher G. 10/07/2019 1:15 PM Medical Record Number: [id number] Patient Account Number: [id number] Date of Birth/Sex: Treating RN: 09/14/1955 (64 y.o. [name] Provider: [name] Other Clinician: Referring Provider: Treating Provider/Extender: [name] in Treatment: 356 Notes Wound exam; left medial malleolus under illumination nonviable surface I used a #3 curette to remove skin and subcutaneous debris there is no evidence of infection. Pedal pulses are palpable Electronic Signature(s) Signed: 10/07/2019 4:59:38 PM By: Linton Ham MD Entered By: Linton Ham on 10/07/2019 14:17:46 -------------------------------------------------------------------------------- Physician Orders Details Patient Name: Date of Service: [facility] [name], [name] 10/07/2019 1:15 PM Medical Record Number: [id number] Patient Account Number: [id number] Date of Birth/Sex: Treating RN: 07/08/1955 (64 y.o. 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 Dressing Change Frequency Wound #3 Left,Medial Malleolus Do not change entire dressing for one week. Skin Barriers/Peri-Wound Care Moisturizing lotion TCA Cream or Ointment - mixed with lotion Wound Cleansing May shower with protection. Primary Wound Dressing Wound #3 Left,Medial Malleolus Hydrofera Blue - classic Secondary Dressing Wound #3 Left,Medial Malleolus Dry Gauze Edema Control Unna Boot to Left Lower Extremity Avoid standing for long periods of time Elevate legs to the level of the heart or above for 30 minutes daily and/or when sitting, a frequency of: - throughout the day Support Garment 20-30 mm/Hg pressure to: - compression stocking right leg daily Electronic Signature(s) Signed: 10/07/2019 4:59:38 PM By: Linton Ham MD Signed: 10/07/2019 5:35:25 PM By: Baruch Gouty RN, BSN Entered By: Baruch Gouty on 10/07/2019 14:12:02 -------------------------------------------------------------------------------- Problem List Details Patient Name: Date of Service: [name], [name] 10/07/2019 1:15 PM Medical Record Number: [id number] Patient Account Number: [id number] Date of Birth/Sex: Treating RN: August 25, 1955 (64 y.o. 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 08/03/2014 08/03/2014 L03.116 Cellulitis of left lower  limb 09/07/2014 09/07/2014 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 10/07/2019 1:15 PM Medical Record Number: [id number] Patient Account Number: [id number] Date of Birth/Sex: Treating RN: June 30, 1955 (64 y.o. [name] Provider: [name] Other Clinician: Referring Provider: Treating Provider/Extender: [name] in Treatment: 356 Subjective History of Present Illness (HPI) the remaining wound is over the left medial ankle. Similar wound over the right medial ankle healed largely with use of Apligraf. Most recently we have been using Hydrofera Blue over this wound with considerable improvement. The patient has been extensively worked up in the past for her venous insufficiency and she is not a candidate for antireflux surgery although I have none of the details available currently. 08/24/14; considerable improvement today. About 50% of this wound areas now epithelialized. The base of the wound appears to be healthier granulation.as opposed to last week when she had deteriorated a considerable improvement 08/17/14; unfortunately the wound has regressed somewhat. The areas of epithelialization from the superior aspect are not nearly as healthy as they were last week. The patient thinks her Hydrofera Blue slipped. 09/07/14; unfortunately the area has markedly regressed in the 2 weeks since I've seen this. There is an odor surrounding erythema. The  healthy granulation tissue that we had at the base of the wound now is a dusky color. The nurse reports green drainage 09/14/14; the area looks somewhat better than last week. There is less erythema and less drainage. The culture I did did not show any growth. Nevertheless I think it is better to continue the Cipro and doxycycline for a further week. The remaining wound area was debridement. 09/21/14. Wound did not require debridement last week. Still less erythema and less drainage. She can complete her antibiotics. The areas of epithelialization in the superior aspect of the wound do not look as healthy as they did some weeks ago 10/05/14 continued improvement in the condition of this wound. There is advancing epithelialization. Less aggressive debridement required 10/19/14 continued improvement in the condition and volume of this wound. Less aggressive debridement to the inferior part of this to remove surface slough and fibrinous eschar 11/02/14 no debridement is required. The surface granulation appears healthy although some of her islands of epithelialization seem to have regressed. No evidence of infection 11/16/14; lites surface debridement done of surface eschar. The wound does not look to be unhealthy. No evidence of infection. Unfortunately the patient has had podiatry issues in the right foot and for some reason has redeveloped small surface ulcerations in the medial right ankle. Her original presentation involved wounds in this area 11/23/14 no debridement. The area on the right ankle has enlarged. The left ankle wound appears stable in terms of the surface although there is periwound inflammation. There has been regression in the amount of new skin 11/30/14 no debridement. Both wound areas appear healthy. There was no evidence of infection. The the new area on the right medial ankle has enlarged although that both the surfaces appear to be stable. 12/07/14; Debridement of the right medial ankle  wound. No no debridement was done on the left. 12/14/14 no major change in and now bilateral medial ankle wounds. Both of these are very painful but the no overt evidence of infection. She has had previous venous ablation 12/21/14; patient states that her right medial ankle wound is considerably more painful last week than usual. Her left is also somewhat painful. She could not tolerate debridement. The right medial ankle wound has fibrinous surface eschar 12/28/14 this is a  patient with severe bilateral venous insufficiency ulcers. For a considerable period of time we actually had the one on the right medial ankle healed however this recently opened up again in June. The left medial ankle wound has been a refractory area with some absent flows. We had some success with Hydrofera Blue on this area and it literally closed by 50% however it is recently opened up Foley. Both of these were debridement today of surface eschar. She tolerates this poorly 01/25/15: No change in the status of this. Thick adherent escar. Very poor tolerance of any attempt at debridement. I had healed the right medial malleolus wound for a considerable amount of time and had the left one down to about 50% of the volume although this is totally regressed over the last 48 weeks. Further the right leg has reopened. she is trying to make a appointment with pain and vascular, previous ablations with Dr. Aleda Grana. I do not believe there is an arterial insufficiency issue here 02/01/15 the status of the adherent eschar bilaterally is actually improved. No debridement was done. She did not manage to get vascular studies done 02/08/15 continued debridement of the area was done today. The slough is less adherent and comes off with less pressure. There is no surrounding infection peripheral pulses are intact 02/15/15 selective debridement with a disposable curette. Again the slough is less adherent and comes off with less difficulty. No  surrounding infection peripheral pulses are intact. 02/22/15 selective debridement of the right medial ankle wound. Slough comes off with less difficulty. No obvious surrounding infection peripheral pulses are intact I did not debridement the one on the left. Both of these are stable to improved 03/01/15 selective debridement of both wound areas using a curette to. Adherent slough cup soft with less difficulty. No obvious surrounding infection. The patient tells me that 2 days ago she noted a rash above the right leg wrap. She did not have this on her lower legs when she change this over she arrives with widespread left greater than right almost folliculitis-looking rash which is extremely pruritic. I don't see anything to culture here. There is no rash on the rest of her body. She feels well systemically. 03/08/15; selective debridement of both wounds using a curette. Base of this does not look unhealthy. She had limegreen drainage coming out of the left leg wound and describes a lot of drainage. The rash on her left leg looks improved to. No cultures were done. 03/22/15; patient was not here last week. Basal wounds does not look healthy and there is no surrounding erythema. No drainage. There is still a rash on the left leg that almost looks vasculitic however it is clearly limited to the top of where the wrap would be. 04/05/15; on the right required a surgical debridement of surface eschar and necrotic subcutaneous tissue. I did not debridement the area on the left. These continue to be large open wounds that are not changing that much. We were successful at one point in healing the area on the right, and at the same time the area on the left was roughly half the size of current measurements. I think a lot of the deterioration has to do with the prolonged time the patient is on her feet at work 04/19/15 I attempted-like surface debridement bilaterally she does not tolerate this. She tells me that she  was in allergic care yesterday with extreme pain over her left lateral malleolus/ankle and was told that she has an "sprain"  05/03/15; large bilateral venous insufficiency wounds over the medial malleolus/medial aspect of her ankles. She complains of copious amounts of drainage and his usual large amounts of pain. There is some increasing erythema around the wound on the right extending into the medial aspect of her foot to. historically she came in with these wounds the right one healed and the left one came down to roughly half its current size however the right one is reopened and the left is expanded. 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 05/10/15 large bilateral venous insufficiency wounds. There is less adherence surface left however the surface culture that I did last week grew pseudomonas therefore bilateral selective debridement score necessary. There is surrounding erythema. The patient describes severe bilateral drainage and a lot of pain in the left ankle. Apparently her podiatrist was were ready to do a cortisone shot 05/17/15; the patient complains of pain and again copious amounts of drainage. 05/24/15; we used Iodo flex last week. Patient notes considerable improvement in wound drainage. Only needed to change this once. 05/31/15; we continued Iodoflex; the base of these large wounds bilaterally is not too bad but there is probably likely a significant bioburden here. I would like to debridement just doesn't tolerate it. 06/06/14 I would like to continue the Iodoflex although she still hasn't managed to obtain supplies. She has bilateral medial malleoli or large wounds which are mostly superficial. Both of them are covered circumferentially with some nonviable fibrinous slough although she tolerates debridement very poorly. She apparently has an appointment for an ablation on the right leg by interventional radiology. 06/14/15; the  patient arrives with the wounds and static condition. We attempted a debridement although she does not do well with this secondary to pain. I 07/05/15; wounds are not much smaller however there appears to be a cleaner granulating base. The left has tight fibrinous slough greater than the right. Debridement is tolerated poorly due to pain. Iodoflex is done more for these wounds in any of the multitude of different dressings I have tried on the left 1 and then subsequently the right. 07/12/15; no change in the condition of this wound. I am able to do an aggressive debridement on the right but not the left. She simply cannot tolerate it. We have been using Iodoflex which helps somewhat. It is worthwhile remembering that at one point we healed the right medial ankle wound and the left was about 25% of the current circumference. We have suggested returning to vascular surgery for review of possible further ablations for one reason or another she has not been able to do this. 07/26/15 no major change in the condition of either wound on her medial ankle. I did not attempt to debridement of these. She has been aggressively scrubbing these while she is in the shower at home. She has her supply of Iodoflex which seems to have done more for these wounds then anything I have put on recently. 08/09/15 wound area appears larger although not verified by measurements. Using Iodoflex 09/05/2015 -- she was here for avisit today but had significant problems with the wound and I was asked to see her for a physician opinion. I have summarize that this lady has had surgery on her left lower extremity about 10 years ago where the possible veins stripping was done. She has had an opinion from interventional radiology around November 2016 where no further sclerotherapy was ordered. The  patient works 12 hours a day and stands on a concrete floor with work boots and is unable to get the proper compression she requires and cannot  elevate her limbs appropriately at any given time. She has recently grown Pseudomonas from her wound culture but has not started her ciprofloxacin which was called in for her. 09/13/15 this continues to be a difficult situation for this patient. At one point I had this wound down to a 1.5 x 1.5" wound on her left leg. This is deteriorated and the right leg has reopened. She now has substantial wounds on her medial calcaneus, malleoli and into her lower leg. One on the left has surface eschar but these are far too painful for me to debridement here. She has a vascular surgery appointment next week to see if anything can be done to help here. I think she has had previous ablations several years ago at [facility]. She has no major edema. She tells me that she did not get product last time [facility]] and went for several days without it. She continues to work in work boots 12 hours a day. She cannot get compression/4-layer under her work boots. 09/20/15 no major change. Periwound edema control was not very good. Her point with pain and vascular is next Wednesday the 25th 09/28/15; the patient is seen vascular surgery and is apparently scheduled for repeat duplex ultrasounds of her bilateral lower legs next week. 10/05/15; the patient was seen by Dr. Doren Custard of vascular surgery. He feels that she should have arterial insufficiency excluded as cause/contributed to her nonhealing stage she is therefore booked for an arteriogram. She has apparently monophasic signals in the dorsalis pedis pulses. She also of course has known severe chronic venous insufficiency with previous procedures as noted previously. I had another long discussion with the patient today about her continuing to work 12 hour shifts. I've written her out for 2 months area had concerns about this as her work location is currently undergoing significant turmoil and this may lead to her termination. She is aware of this however I agree with her  that she simply cannot continue to stand for 12 hours multiple days a week with the substantial wound areas she has. 10/19/15; the Dr. Doren Custard appointment was largely for an arteriogram which was normal. She does not have an arterial issue. He didn't make a comment about her chronic venous insufficiency for which she has had previous ablations. Presumably it was not felt that anything additional could be done. The patient is now out of work as I prescribed 2 weeks ago. Her wounds look somewhat less aggravated presumably because of this. I felt I would give debridement another try today 10/25/15; no major change in this patient's wounds. We are struggling to get her product that she can afford into her own home through her insurance. 11/01/15; no major change in the patient's wounds. I have been using silver alginate as the most affordable product. I spoke to Dr. Marla Roe last week with her requested take her to the OR for surgical debridement and placement of ACEL. Dr. Marla Roe told me that she would be willing to do this however [facility] will not cover this, fortunately the patient has [place] healthcare of some variant 11/08/15; no major change in the patient's wounds. She has been completely nonviable surface that this but is in too much pain with any attempted debridement are clinic. I have arranged for her to see Dr. Marla Roe ham of plastic  surgery and this appointment is on Monday. I am hopeful that they will take her to the OR for debridement, possible ACEL ultimately possible skin graft 11/22/15 no major change in the patient's wounds over her bilateral medial calcaneus medial malleolus into the lower legs. Surface on these does not look too bad however on the left there is surrounding erythema and tenderness. This may be cellulitis or could him sleepy tinea. 11/29/15; no major changes in the patient's wounds over her bilateral medial malleolus. There is no infection here and I don't  think any additional antibiotics are necessary. There is now plan to move forward. She sees Dr. Marla Roe in a week's time for preparation for operative debridement and ACEL placement I believe on 7/12. She then has a follow-up appointment with Dr. Marla Roe on 7/21 12/28/15; the patient returns today having been taken to the [place] by Dr. Marla Roe 12/12/15 she underwent debridement, intraoperative cultures [which were negative]. She had placement of a wound VAC. Parent really ACEL was not available to be placed. The wound VAC foam apparently adhered to the wound since then she's been using silver alginate, Xeroform under Ace wraps. She still says there is a lot of drainage and a lot of pain 01/31/16; this is a patient I see monthly. I had referred her to Dr. Marla Roe him of plastic surgery for large wounds on her bilateral medial ankles. She has been to the OR twice once in early July and once in early August. She tells me over the last 3 weeks she has been using the wound VAC with ACEL underneath it. On the right we've simply been using silver alginate. Under Kerlix Coban wraps. 02/28/16; this is a patient I'm currently seeing monthly. She is gone on to have a skin graft over her large venous insufficiency ulcer on the left medial ankle. This was done by Dr. Marla Roe him. The patient is a bit perturbed about why she didn't have one on her right medial ankle wound. She has been using silver alginate to this. 03/06/16; I received a phone call from her plastic surgery Dr. Marla Roe. She expressed some concern about the viability of the skin graft she did on the left medial ankle wound. Asked me to place Endoform on this. She told me she is not planning to do a subsequent skin graft on the right as the left one did not take very well. I had placed Hydrofera Blue on the right 03/13/16; continue to have a reasonably healthy wound on the right medial ankle. Down to 3 mm in terms of size. There is  epithelialization here. The area on the left medial ankle is her skin graft site. I suppose the last week this looks somewhat better. She has an open area inferiorly however in the center there appears to be some viable tissue. There is a lot of surface callus and eschar that will eventually need to come off however none of this looked to be infected. Patient states that the is able to keep the dressing on for several days which is an improvement. 03/20/16 no major change in the circumference of either wound however on the left side the patient was at Dr. Eusebio Friendly office and they did a debridement of left wound. 50% of the wound seems to be epithelialized. I been using Endoform on the left Hydrofera Blue in the right 03/27/16; she arrives today with her wound is not looking as healthy as they did last week. The area on the right clearly has an  adherent surface to this a very similar surface on the left. Unfortunately for this patient this is all too familiar problem. Clearly the Endoform is not working and will need to change that today that has some potential to help this surface. She does not tolerate debridement in this clinic very well. She is changing the dressing wants 04/03/16; patient arrives with the wounds looking somewhat better especially on the right. Dr. Migdalia Dk change the dressing to silver alginate when she saw her on Monday and also sold her some compression socks. The usefulness of the latter is really not clear and woman with severely draining wounds. 04/10/16; the patient is doing a bit of an experiment wearing the compression stockings that Dr. Migdalia Dk provided her to her left leg and the out of legs based dressings that we provided to the right. 05/01/16; the patient is continuing to wear compression stockings Dr. Migdalia Dk provided her on the left that are apparently silver impregnated. She has been using Iodoflex to the right leg wound. Still a moderate amount of drainage, when she  leaves here the wraps only last for 4 days. She has to change the stocking on the left leg every night 05/15/16; she is now using compression stockings bilaterally provided by Dr. Marla Roe. She is wearing a nonadherent layer over the wounds so really I don't think there is anything specific being done to this now. She has some reduction on the left wound. The right is stable. I think all healing here is being done without a specific dressing 06/09/16; patient arrives here today with not much change in the wound certainly in diameter to large circular wounds over the medial aspect of her ankle bilaterally. Under the light of these services are certainly not viable for healing. There is no evidence of surrounding infection. She is wearing compression stockings with some sort of silver impregnation as prescribed by Dr. Marla Roe. She has a follow-up with her tomorrow. 06/30/16; no major change in the size or condition of her wounds. These are still probably covered with a nonviable surface. She is using only her purchase stockings. She did see Dr. Marla Roe who seemed to want to apply Dakin's solution to this I'm not extreme short what value this would be. I would suggest Iodoflex which she still has at home. 07/28/16; I follow Mrs. Constantin episodically along with Dr. Marla Roe. 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 She has been applying a topical collagen ointment to both wounds with Adaptic. I don't think Dr. Marla Roe is planning to take her back to the OR. 08/19/16; I follow Mrs. Jeneen Rinks on a monthly basis along with Dr. Marla Roe of plastic surgery. 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 I been following her for a number of years. At one point I was able to get the right medial malleolus wound to heal and had the left medial malleolus down to about half its  current size however and I had to send her to plastic surgery for an operative debridement. Since then things have been stable to slightly improve the area on the right is slightly better one in the left about the same although there is much less adherent surface than I'm used to with this patient. She is using some form of liquid collagen gel that Dr. Marla Roe provided a Kerlix cover with the patient's own pressure stockings. She tells me that she has extreme pain  in both ankles and along the lateral aspect of both feet. She has been unable to work for some period of time. She is telling me she is retiring at the beginning of April. She sees Dr. Doran Durand of orthopedics next week 09/22/16; patient has not seen Dr. Marla Roe since the last time she is here. I'm not really sure what she is using to the wounds other than bits and pieces of think she had left over including most recently Hydrofera Blue. She is using juxtalite stockings. She is having difficulty with her husband's recent illness "stroke". She is having to transport him to various doctors appointments. Dr. Marla Roe left her the option of a repeat debridement with ACEL however she has not been able to get the time to follow-up on this. She continues to have a fair amount of drainage out of these wounds with certainly precludes leaving dressings on all week 10/13/16; patient has not seen Dr. Marla Roe since she was last in our clinic. I'm not really sure what she is doing with the wounds, we did try to get her [facility] and I think she is actually using this most of the time. Because of drainage she states she has to change this every second day although this is an improvement from what she used to do. She went to see Dr. Doran Durand who did not think she had a muscular issue with regards to her feet, he referred her to a neurologist and I think the appointment is sometime in June. I changed her back to Iodoflex which she has used in the past  but not recently. 11/03/16; the patient has been using Iodoflex although she ran out of this. Still claims that there is a lot of drainage although the wound does not look like this. No surrounding erythema. She has not been back to see Dr. Marla Roe 11/24/16; the patient has been using Iodoflex again but she ran out of it 2 or 3 days ago. There is no major change in the condition of either one of these wounds in fact they are larger and covered in a thick adherent surface slough/nonviable tissue especially on the left. She does not tolerate mechanical debridement in our clinic. Going back to see Dr. Marla Roe of plastic surgery for an operative debridement would seem reasonable. 12/15/16; the patient has not been back to see Dr. Marla Roe. She is been dealing with a series of illnesses and her husband which of monopolized her time. She is been using Sorbact which we largely supplied. She states the drainage is bad enough that it maximum she can go 2-3 days without changing the dressing 01/12/2017 -- the patient has not been back for about 4 weeks and has not seen Dr. Marla Roe not does she have any appointment pending. 01/23/17; patient has not seen Dr. Marla Roe even though I suggested this previously. She is using Santyl that was suggested last week by Dr. Con Memos this Cost her $16 through her insurance which is indeed surprising 02/12/17; continuing Santyl and the patient is changing this daily. A lot of drainage. She has not been back to see plastic surgery she is using an Ace wrap. Our intake nurse suggested wrap around stockings which would make a good reasonable alternative 02/26/17; patient is been using Santyl and changing this daily due to drainage. She has not been to see plastic surgery she uses in April Ace wrap to control the edema. She did obtain extremitease stockings but stated that the edema in her leg was to  big for these 03/20/17; patient is using Santyl and Anasept. Surfaces looked  better today the area on the right is actually measuring a little smaller. She has states she has a lot of pain in her feet and ankles and is asking for a consult to pain control which I'll try to help her with through our case manager. 04/10/17; the patient arrives with better-looking wound surfaces and is slightly smaller wound on the left she is using a combination of Santyl and Anasept. She has an appointment or at least as started in the pain control center associated with [place] [facility] 05/14/17; this is a patient who I followed for a prolonged period of time. 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 At one point I had this down to a much smaller wound on the left however these reopened and we've never been able to get these to heal. She has been using Santyl and Anasept gel although 2 weeks ago she ran out of the Anasept gel. She has a stable appearance of the wound. She is going to the wound care clinic at [facility]. They wanted do a nerve block/spinal block although she tells me she is reluctant to go forward with that. 05/21/17; this is a patient I have followed for many years. 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 Chronic pain and deformity in her ankles as well. She is been to see plastic surgery as well as orthopedics. Using PolyMem AG most recently/Kerramax/ABDs and 2 layer compression. She has managed to keep this on and she is coming in for a nurse check to change the dressing on Tuesdays, we see her on Fridays 06/05/17; really quite a good looking surface and the area especially on the right medial has contracted in terms of dimensions. Well granulated healthy-looking tissue on both sides. Even with an open curet there is nothing that even feels abnormal here. This is as good as I've seen this in quite some time. We have been using PolyMem AG and bringing her in for a nurse check 06/12/17; really quite good surface on both  of these wounds. The right medial has contracted a bit left is not. We've been using PolyMem and AG and she is coming in for a nurse visit 06/19/17; we have been using PolyMem AG and bringing her in for a nurse check. Dimensions of her wounds are not better but the surfaces looked better bilaterally. She complained of bleeding last night and the left wound and increasing pain bilaterally. She states her wound pain is more neuropathic than just the wounds. There was some suggestion that this was radicular from her pain management doctor in talking to her it is really difficult to sort this out. 06/26/17; using PolyMem and AG and bringing her in for a nurse check as All of this and reasonably stable condition. Certainly not improved. The dimensions on the lateral part of the right leg look better but not really measuring better. The medial aspect on the left is about the same. 07/03/16; we have been using PolyMen AG and bringing her in for a nurse check to change the dressings as the wounds have drainage which precludes once weekly changing. We are using all secondary absorptive dressings.our intake nurse is brought up the idea of using a wound VAC/snap VAC on the wound to help with the drainage to see if this would result in some contraction. This is not a bad idea. The area on the right medial  is actually looking smaller. Both wounds have a reasonable-looking surface. There is no evidence of cellulitis. The edema is well controlled 07/10/17; the patient was denied for a snap VAC by her insurance. The major issue with these wounds continues to be drainage. We are using wicked PolyMem AG and she is coming in for a nurse visit to change this. The wounds are stable to slightly improved. The surface looks vibrant and the area on the right certainly has shrunk in size but very slowly 07/17/17; the patient still has large wounds on her bilateral medial malleoli. Surface of both of these wounds looks better. The  dimensions seem to come and go but no consistent improvement. There is no epithelialization. We do not have options for advanced treatment products due to insurance issues. They did not approve of the wound VAC to help control the drainage. More recently we've been using PolyMem and AG wicked to allow drainage through. We have been bringing her in for a nurse visit to change this. We do not have a lot of options for wound care products and the home again due to insurance issues 07/24/17; the patient's wound actually looks somewhat better today. No drainage measurements are smaller still healthy-looking surface. We used silver collagen under PolyMen started last week. We have been bringing her in for a dressing change 07/31/17; patient's wound surface continued to look better and I think there is visible change in the dimensions of the wound on the right. Rims of epithelialization. We have been using silver collagen under PolyMen and bringing her in for a dressing change. There appears to be less drainage although she is still in need of the dressing change 08/07/17. Patient's wound surface continues to look better on both sides and the area on the right is definitely smaller. We have been using silver collagen and PolyMen. She feels that the drainage has been it has been better. I asked her about her vascular status. She went to see Dr. Aleda Grana at [facility] and had some form of ablation. I don't have much detail on this. I haven't my notes from 2016 that she was not a candidate for any further ablation but I don't have any more information on this. We had referred her to vein and vascular I don't think she ever went. He does not have a history of PAD although I don't have any information on this either. We don't even have ABIs in our record 08/14/17; we've been using silver collagen and PolyMen cover. And putting the patient and compression. She we are bringing her in as a nurse visit to  change this because ofarge amount of drainage. We didn't the ABIs in clinic today since they had been done in many moons 1.2 bilaterally. She has been to see vein and vascular however this was at [facility] and she had ablation although I really don't have any information on this all seemed biking get a report. She is also been operatively debrided by plastic surgery and had a cell placed probably 8-12 months ago. This didn't have a major effect. We've been making some gains with current dressings 08/19/17-She is here in follow-up evaluation for bilateral medial malleoli ulcers. She continues to tolerate debridement very poorly. We will continue with recently changed topical treatment; if no significant improvement may consider switching to Iodosorb/Iodoflex. 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 She has been using silver collagen and PolyMem. I believe she has been used and  tried on Iodoflex before. 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 These have been very refractory over time. We have been using silver collagen and PolyMen. She is coming in once a week for a doctor's and once a week for nurse visits. We are actually making some progress 09/18/17; the patient's wounds are smaller especially on the right medial. She arrives today to upset to consider even washing these off with Anasept which I think is been part of the reason this is been closing. We've been using collagen covered in PolyMen otherwise. It is noted that she has a small area of folliculitis on the right medial calf that. As we are wrapping her legs I'll give her a short course of doxycycline to make sure this doesn't amount to anything. She is a long list of complaints today including  imbalance, shortness of breath on exertion, inversion of her left ankle. With regards to the latter complaints she is been to see orthopedics and they offered her a tendon release surgery I believe but wanted her wounds to be closed first. I have recommended she go see her primary physician with regards to everything else. 09/25/17; patient's wounds are about the same size. We have made some progress bilaterally although not in recent weeks. She will not allow me T wash these wounds with Anasept even if she is doing her cell. Wheeze we've been using collagen covered in PolyMen. Last week she had a small area of folliculitis this is now opened into a small wound. She completed 5 days of trimethoprim sulfamethoxazole 10/02/17; unfortunately the area on her left medial ankle is worse with a larger wound area towards the Achilles. The patient complains of a lot of pain. She will not allow debridement although visually I don't think there is anything to debridement in any case. We have been using silver collagen and PolyMen for several months now. Initially we are making some progress although I'm not really seeing that today. We will move back to [facility]. His admittedly this is a bit of a repeat however I'm hoping that his situation is different now. The patient tells me she had her leg on the left give out on her yesterday this is process some pain. 10/09/17; the patient is seen twice a week largely because of drainage issues coming out of the chronic medial bimalleolar wounds that are chronic. Last week the dimensions of the one on the left looks a little larger I changed her to [facility]. She comes in today with a history of terrible pain in the bilateral wound areas. She will not allow debridement. She will not even allow a tissue culture. There is no surrounding erythema no no evidence of cellulitis. We have been putting her Kerlix Coban man. She will not allow more aggressive compression as  there was a suggestion to put her in 3 layer wraps. 10/16/17; large wounds on her bilateral medial malleoli. These are chronic. Not much change from last week. The surface looks have healthy but absolutely no epithelialization. A lot of pain little less so of drainage. She will not allow debridement or even washing these off in the vigorous fashion with Anasept. 10/23/17; large wounds on her bilateral malleoli which are chronic. Some improvement in terms of size perhaps on the right since last time I  saw these. She states that after we increased the 3 layer compression there was some bleeding, when she came in for a nurse visit she did not want 3 layer compression put back on about our nurse managed to convince her. She has known chronic venous visit issues and I'm hoping to get her to tolerate the 3 layer compression. using Hydrofera Blue 10/30/17; absolutely no change in the condition of either wound although we've had some improvement in dimensions on the right.. Attempted to put her in 3 layer compression she didn't tolerated she is back in 2 layer compression. We've been using Hydrofera Blue We looked over her past records. She had venous reflux studies in November 2016. There was no evidence of deep venous reflux on the right. Superficial vein did not show the greater saphenous vein at think this is been previously ablated the small saphenous vein was within normal limits. The left deep venous system showed no DVT the vessels were positive for deep venous reflux in the posterior tibial veins at the ankle. The greater saphenous vein was surgically absent small saphenous vein was within normal limits. She went to vein and vascular at [facility]. I believe she had an ablation on the left greater saphenous vein. I'll update her reflux studies perhaps ever reviewed by vein and vascular. We've made absolutely no progress in these wounds. Will also try to read and TheraSkins through her  insurance 11/06/17; W the patient apparently has a 2 week follow-up with vein and vascular I like him to review the whole issue with regards to her previous vascular workup by Dr. Aleda Grana. We've really made no progress on these wounds in many months. She arrives today with less viable looking surface on the left medial ankle wound. This was apparently looking about the same on Tuesday when she was here for nurse visit. 11/13/17; deep tissue culture I did last time of the left lower leg showed multiple organisms without any predominating. In particular no Staphylococcus or group A strep were isolated. We sent her for venous reflux studies. She's had a previous left greater saphenous vein stripping and I think sclerotherapy of the right greater saphenous vein. She didn't really look at the lesser saphenous vein this both wounds are on the medial aspect. She has reflux in the common femoral vein and popliteal vein and an accessory vein on the right and the common femoral vein and popliteal vein on the left. I'm going to have her go to see vein and vascular just the look over things and see if anything else beside aggressive compression is indicated here. We have not been able to make any progress on these wounds in spite of the fact that the surface of the wounds is never look too bad. 11/20/17; no major change in the condition of the wounds. Patient reports a large amount of drainage. She has a lot of complaints of pain although enlisting her today I wonder if some of this at least his neuropathic rather than secondary to her wounds. She has an appointment with vein and vascular on 12/30/17. The refractory nature of these wounds in my mind at least need vein and vascular to look over the wounds the recent reflux studies we did and her history to see if anything further can be done here. I also note her gait is deteriorated quite a bit. 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 She has a  bilateral Trendelenburg gait. I wonder if this is  neuropathic or perhaps multilevel radicular. 11/27/17; her wounds actually looks slightly better. Healthy-looking granulation tissue a scant amount of epithelialization. [place] [facility] will not pay for [employer]. They will play for tri layer Oasis and Dermagraft. This is not a diabetic ulcer. We'll try for the tri layer Oasis. She still complains of some drainage. She has a vein and vascular appointment on 12/30/17 12/04/17; the wounds visually look quite good. Healthy-looking granulation with some degree of epithelialization. We are still waiting for response to our request for trial to try layer Oasis. Her appointment with vascular to review venous and arterial issues isn't sold the end of July 7/31. Not allow debridement or even vigorous cleansing of the wound surface. 12/18/17; slightly smaller especially on the right. 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 We've been using Hydrofera Blue. We still are looking into triple layer Oasis through her insurance 01/08/18 on evaluation today patient's wound actually appears to be showing signs of good improvement at this point in time. She has been tolerating the dressing changes without complication. Fortunately there does not appear to be any evidence of infection at this point in time. We have been utilizing silver nitrate which does seem to be of benefit for her which is also good news. Overall I'm very happy with how things seem to be both regards appearance as well as measurement. Patient did see Dr. Bridgett Larsson for evaluation on 12/30/17. In his assessment he felt that stripping would not likely add much more than chronic compression to the patient's healing process. His recommendation was to follow-up in three months with Dr. Doren Custard if she hasn't healed in order to consider referral back to you and see vascular where she previously was in a trial and was able to get her wound to  heal. I'll be see what she feels she when you staying compression and he reiterated this as well. 01/13/18 on evaluation today patient appears to actually be doing very well in regard to her bilateral medial malleolus ulcers. She seems to have tolerated the chemical cauterization with silver nitrate last week she did have some pain through that evening but fortunately states that I'll be see since it seems to be doing better she is overall pleased with the progress. 01/21/18; really quite a remarkable improvement since I've last seen these wounds. We started using silver nitrate specially on the islands of hyper granulation which for some reason her around the wound circumference. This is really done quite nicely. Primary dressing Hydrofera Blue under 4 layer compression. She seems to be able to hold out without a nurse rewrap. Follow-up in 1 week 01/28/18; we've continued the hydrofera blue but continued with chemical cauterization to the wound area that we started about a month ago for irregular hyper granulation. She is made almost stunning improvement in the overall wound dimensions. I was not really expecting this degree of improvement in these chronic wounds 02/05/18; we continue with Hydrofera Bluebut of also continued the aggressive chemical cauterization with silver nitrate. We made nice progress with the right greater than left wound. 02/12/18. We continued with Hydrofera Blue after aggressive chemical cauterization with silver nitrate. We appear to be making nice progress with both wound areas 02/19/2018; we continue with [facility] after washing the wounds vigorously with Anasept spray and chemical cauterization with silver nitrate. We are making excellent progress. The area on the right's just about closed 02/26/2018. The area on the left medial ankle had too much necrotic debris today. I used a #  5 curette we are able to get most of the soft. I continued with the silver nitrate to the much  smaller wound on the right medial ankle she had a new area on her right lower pretibial area which she says was due to a role in her compression 03/05/2018; both wound areas look healthy. Not much change in dimensions from last week. I continue to use silver nitrate and Hydrofera Blue. The patient saw Dr. Doren Custard of vein and vascular. He felt she had venous stasis ulcers. He felt based on her previous arteriogram she should have adequate circulation for healing. Also she has deep venous reflux but really no significant correctable superficial venous reflux at this time. He felt we should continue with conservative management including leg elevation and compression 04/02/2018; since we last saw this woman about a month ago she had a fall apparently suffered a pelvic fracture. I did not look up the x-ray. Nevertheless because of pain she literally was bedbound for 2 weeks and had home health coming out to change the dressing. Somewhat predictably this is resulted in considerable improvement in both wound areas. The right is just about closed on the medial malleolus and the left is about half the size. 04/16/2018; both her wounds continue to go down in size. Using Hydrofera Blue. 05/07/18; both her wounds appeared to be improving especially on the right where it is almost closed. We are using Hydrofera Blue 05/14/2018; slightly worse this week with larger wounds. Surface on the left medial not quite as good. We have been using Hydrofera Blue 05/21/18; again the wounds are slightly larger. Left medial malleolus slightly larger with eschar around the circumference. We have been using Hydrofera Blue undergoing a wraps for a prolonged period of time. This got a lot better when she was more recumbent due to a fall and a back injury. I change the primary dressing the silver alginate today. She did not tolerate a 4 layer compression previously although I may need to bring this up with her next time 05/28/2018; area  on the left medial malleolus again is slightly larger with more drainage. Area on the right is roughly unchanged. She has a small area of folliculitis on the right medial just on the lower calf. This does not look ominous. 06/03/2018 left medial malleolus slightly smaller in a better looking surface. We used silver nitrate on this last time with silver alginate. The area on the right appears slightly smaller 1/10; left medial malleolus slightly smaller. Small open area on the right. We used silver nitrate and silver alginate as of 2 weeks ago. We continue with the wound and compression. These got a lot better when she was off her feet 1/17; right medial malleolus wound is smaller. The left may be slightly smaller. Both surfaces look somewhat better. 1/24; both wounds are slightly smaller. Using silver alginate under Unna boots 1/31; both wounds appear smaller in fact the area on the right medial is just about closed. Surface eschar. We have been using silver alginate under Unna boots. The patient is less active now spends let much less time on her feet and I think this is contributed to the general improvement in the wound condition 2/7; both wounds appear smaller. I was hopeful the right medial would be closed however there there is still the same small open area. Slight amount of surface eschar on the left the dimensions are smaller there is eschar but the wound edges appear to be free. We have been  using silver alginate under Unna boot's 2/14; both wounds once again measure smaller. Circumferential eschar on the left medial. We have been using silver alginate under Unna boots with gradual improvement 2/21; the area on the right medial malleolus has healed. The area on the left is smaller. We have been using silver alginate and Unna boots. We can discharge wrapping the right leg she has 20/30 stockings at home she will need to protect the scar tissue in this area 2/28; the area on the right medial  malleolus remains closed the patient has a compression stocking. The area on the left is smaller. We have been using silver alginate and Unna boots. 3/6 the area on the right medial ankle remains closed. Good edema control noted she is using her own compression stocking. The area on the left medial ankle is smaller. We have been managing this with silver alginate and Unna boots which we will continue today. 3/13; the area on the right medial ankle remains closed and I'm declaring it healed today. When necessary the left is about the same still a healthy-looking surface but no major change and wound area. No evidence of infection and using silver alginate under unna and generally making considerable improvement 3/27 the area on the right medial ankle remains closed the area on the left is about the same as last week. Certainly not any worse we have been using silver alginate under an Unna boot 4/3; the area on the right medial ankle remains closed per the patient. We did not look at this wound. The wound on the left medial ankle is about the same surface looks healthy we have been using silver alginate under an Unna boot 4/10; area on the right medial ankle remains closed per the patient. We did not look at this wound. The wound on the left medial ankle is slightly larger. The patient complains that the [facility] caused burning pain all week. She also told us that she was a lot more active this week. Changed her back to silver alginate 4/17; right medial ankle still closed per the patient. Left medial ankle is slightly larger. Using silver alginate. She did not tolerate Hydrofera Blue on this area 4/24; right medial ankle remains closed we have not look at this. The left medial ankle continues to get larger today by about a centimeter. We have been using silver alginate under Unna boots. She complains about 4 layer compression as an alternative. She has been up on her feet working on her  garden 5/8; right medial ankle remains closed we did not look at this. The left medial ankle has increased in size about 100%. We have been using silver alginate under Unna boots. She noted increased pain this week and was not surprised that the wound is deteriorated 5/15; no major change in SA however much less erythema ( one week of doxy ocellulitis). 5/22-64 year old female returns at 1 week to the clinic for left medial ankle wound for which we have been using silver alginate under 3 layer compression She was placed on DOXY at last visit - the wound is wider at this visit. She is in 3 layer compression 5/29; change to [facility] last week. I had given her empiric doxycycline 2 weeks ago for a week. She is in 3 layer compression. She complains of a lot of pain and drainage on presentation today. 6/5; using Hydrofera Blue. I gave her doxycycline recently empirically for erythema and pain around the wound. Believe her cultures showed  enterococcus which not would not have been well covered by doxycycline nevertheless the wound looks better and I don't feel specifically that the enterococcus needs to be covered. She has a new what looks like a wrap injury on her lateral left ankle. 6/12; she is using Hydrofera Blue. She has a new area on the left anterior lower tibial area. This was a wrap injury last week. 6/19; the patient is using Hydrofera Blue. She arrived with marked inflammation and erythema around the wound and tenderness. 12/01/18 on evaluation today patient appears to be doing a little bit better based on what I'm hearing from the standpoint of lassos evaluation to this as far as the overall appearance of the wound is concerned. Then sometime substandard she typically sees Dr. Dellia Nims. Nonetheless overall very pleased with the progress that she's made up to this point. No fevers, chills, nausea, or vomiting noted at this time. 7/10; some improvement in the surface area. Aggressively  debrided last week apparently. I went ahead with the debridement today although the patient does not tolerate this very well. We have been using Iodoflex. Still a fair amount of drainage 7/17; slightly smaller. Using Iodoflex. 7/24; no change from last week in terms of surface area. We have been using Iodoflex. Surface looks and continues to look somewhat better 7/31; surface area slightly smaller better looking surface. We have been using Iodoflex. This is under Unna boot compression 8/7-Patient presents at 1 week with Unna boot and Iodoflex, wound appears better 8/14-Patient presents at 1 week with Iodoflex, we use the Unna boot, wound appears to be stable better.Patient is getting Botox treatment for the inversion of the foot for tendon release, Next week 8/21; we are using Iodoflex. Unna boot. The wound is stable in terms of surface area. Under illumination there is some areas of the wound that appear to be either epithelialized or perhaps this is adherent slough at this point I was not really clear. It did not wipe off and I was reluctant to debride this today. 8/28; we are using Iodoflex in an Unna boot. Seems to be making good improvement. 9/4; using Iodoflex and wound is slightly smaller. 9/18; we are using Iodoflex with topical silver nitrate when she is here. The wound continues to be smaller 10/2; patient missed her appointment last week due to GI issues. She left and Iodoflex based dressing on for 2 weeks. Wound is about the same size about the size of a dime on the left medial lower 10/9 we have been using Iodoflex on the medial left ankle wound. She has a new superficial probable wrap injury on the dorsal left ankle 10/16; we have been using Hydrofera Blue since last week. This is on the left medial ankle 10/23; we have been using Hydrofera Blue since 2 weeks ago. This is on the left medial ankle. Dimensions are better 11/6; using Hydrofera Blue. I think the wound is smaller but still  not closed. Left medial ankle 11/13; we have been using Hydrofera Blue. Wound is certainly no smaller this week. Also the surface not as good. This is the remanent of a very large area on her left medial ankle. 11/20; using Sorbact since last week. Wound was about the same in terms of size although I was disappointed about the surface debris 12/11; 3-week follow-up. Patient was on vacation. Wound is measuring slightly larger we have been using Sorbact. 12/18; wound is about the same size however surface looks better last week after debridement. We have  been using Sorbact under compression 1/15 wound is probably twice the size of last time increased in length nonviable surface. We have been using Sorbact. She was running a mild fever and missed her appointment last week 1/22; the wound is come down in size but under illumination still a very adherent debris we have been Hydrofera Blue that I changed her to last week 1/29; dimensions down slightly. We have been using Hydrofera Blue 2/19 dimensions are the same however there is rims of epithelialization under illumination. Therefore more the surface area may be epithelialized 2/26; the patient's wound actually measures smaller. The wound looks healthy. We have been using Hydrofera Blue. I had some thoughts about running Apligraf then I still may do that however this looks so much better this week we will delay that for now 3/5; the wound is small but about the same as last week. We have been using Hydrofera Blue. No debridement is required today. 3/19; the wound is about the size of a dime. Healthy looking wound even under illumination. We have been using Hydrofera Blue. No mechanical debridement is necessary 3/26; not much change from last week although still looks very healthy. We have been using Hydrofera Blue under Unna boots Patient was offered an ankle fusion by podiatry but not until the wound heals with a proceed with this. 4/9; the patient  comes in today with her original wound on the medial ankle looking satisfactory however she has some uncontrolled swelling in the middle part of her leg with 2 new open areas superiorly just lateral to the tibia. I think this was probably a wrap issue. She said she felt uncomfortable during the week but did not call in. We have been using Hydrofera Blue 4/16; the wound on the medial ankle is about the same. She has innumerable small areas superior to this across her mid tibia. I think this is probably folliculitis. She is also been working in the yard doing a lot of sweating 4/30; the patient issue on the upper areas across her mid tibia of all healed. I think this was excessive yard work if I remember. Her wound on the medial ankle is smaller. Some debris on this we have been using Hydrofera Blue under Unna boots 5/7; mid tibia. She has been using Hydrofera Blue under an Unna wrap. She is apparently going for her ankle surgery on June 3 Objective Constitutional Vitals Time Taken: 1:18 PM, Height: 68 in, Weight: 127 lbs, BMI: 19.3, Temperature: 98 F, Pulse: 60 bpm, Respiratory Rate: 18 breaths/min, Blood Pressure: 119/46 mmHg. Integumentary (Hair, Skin) Wound #3 status is Open. Original cause of wound was Gradually Appeared. The wound is located on the Left,Medial Malleolus. The wound measures 1cm length x 0.8cm width x 0.2cm depth; 0.628cm^2 area and 0.126cm^3 volume. There is Fat Layer (Subcutaneous Tissue) Exposed exposed. There is no tunneling or undermining noted. There is a small amount of serosanguineous drainage noted. The wound margin is flat and intact. There is large (67-100%) red, pink granulation within the wound bed. There is no necrotic tissue within the wound bed. 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 There was a Excisional Skin/Subcutaneous Tissue Debridement with a total area of 0.8 sq cm performed by Dellia Nims,  Memory Argue., MD. With the following instrument(s): Curette to remove Viable and Non-Viable tissue/material. Material removed includes Subcutaneous Tissue and Slough and after achieving pain control using Lidocaine 4% T opical Solution. No specimens were taken. A time out was conducted at 14:05, prior to the start of the procedure. A Minimum amount of bleeding was controlled with Pressure. The procedure was tolerated well with a pain level of 4 throughout and a pain level of 1 following the procedure. Post Debridement Measurements: 1cm length x 0.8cm width x 0.1cm depth; 0.063cm^3 volume. Character of Wound/Ulcer Post Debridement is improved. Severity of Tissue Post Debridement is: Fat layer exposed. Post procedure Diagnosis Wound #3: Same as Pre-Procedure Pre-procedure diagnosis of Wound #3 is a Venous Leg Ulcer located on the Left,Medial Malleolus . There was a Haematologist Compression Therapy Procedure by Deon Pilling, RN. Post procedure Diagnosis Wound #3: Same as Pre-Procedure Plan Follow-up Appointments: Return Appointment in 1 week. Dressing Change Frequency: Wound #3 Left,Medial Malleolus: Do not change entire dressing for one week. Skin Barriers/Peri-Wound Care: Moisturizing lotion TCA Cream or Ointment - mixed with lotion Wound Cleansing: May shower with protection. Primary Wound Dressing: Wound #3 Left,Medial Malleolus: Hydrofera Blue - classic Secondary Dressing: Wound #3 Left,Medial Malleolus: Dry Gauze Edema Control: Unna Boot to Left Lower Extremity Avoid standing for long periods of time Elevate legs to the level of the heart or above for 30 minutes daily and/or when sitting, a frequency of: - throughout the day Support Garment 20-30  mm/Hg pressure to: - compression stocking right leg daily 1. Hydrofera Blue to continue laundering Unna wrap. 2. I do not know there were going to have this healed by the time she is planning to have surgery on June 3 3. Aggressive debridement today hopefully smaller by next week. Not much change today Electronic Signature(s) Signed: 10/07/2019 4:59:38 PM By: Linton Ham MD Entered By: Linton Ham on 10/07/2019 14:18:29 -------------------------------------------------------------------------------- SuperBill Details Patient Name: Date of Service: [name], [name] Kingfisher G. 10/07/2019 Medical Record Number: [id number] Patient Account Number: [id number] Date of Birth/Sex: Treating RN: 1956-03-28 (64 y.o. [name] Provider: [name] Other Clinician: Referring Provider: Treating Provider/Extender: [name] in Treatment: 356 Diagnosis Coding ICD-10 Codes Code Description [phone] Non-pressure chronic ulcer of left ankle with fat layer exposed L97.321 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 breakdown of skin Facility Procedures CPT4 Code: [id number] Description: 69794 - DEB SUBQ TISSUE 20 SQ CM/< ICD-10 Diagnosis Description L97.322 Non-pressure chronic ulcer of left ankle with fat layer exposed Modifier: Quantity: 1 Physician Procedures : CPT4 Code Description Modifier [id number] - WC PHYS SUBQ TISS 20 SQ CM ICD-10 Diagnosis Description M78.675 Non-pressure chronic ulcer of left ankle with fat layer exposed Quantity: 1 Electronic Signature(s) Signed: 10/07/2019 4:59:38 PM By: Linton Ham MD Entered By: Linton Ham on 10/07/2019 14:18:40

## 2019-10-14 ENCOUNTER — Encounter (HOSPITAL_BASED_OUTPATIENT_CLINIC_OR_DEPARTMENT_OTHER): Payer: Medicare Other | Admitting: Internal Medicine

## 2019-10-14 DIAGNOSIS — L97322 Non-pressure chronic ulcer of left ankle with fat layer exposed: Secondary | ICD-10-CM | POA: Diagnosis not present

## 2019-10-14 NOTE — Progress Notes (Signed)
MAKHAYLA, MCMURRY ([id number]) Visit Report for 10/14/2019 Arrival Information Details Patient Name: Date of Service: [facility], [name] 10/14/2019 10:00 A M Medical Record Number: [id number] Patient Account Number: [id number] Date of Birth/Sex: Treating RN: 07/13/1955 (64 y.o. [name], Linda Primary Care Rondarius Kadrmas: [name] Other Clinician: Referring Lucretia Pendley: Treating Jovany Disano/Extender: [name] in Treatment: 3 Visit Information History Since Last Visit Added or deleted any medications: No Patient Arrived: [name] Any new allergies or adverse reactions: No Arrival Time: 10:46 Had a fall or experienced change in No Accompanied By: self activities of daily living that may affect Transfer Assistance: None risk of falls: Patient Identification Verified: Yes Signs or symptoms of abuse/neglect since last visito No Secondary Verification Process Completed: Yes Hospitalized since last visit: No Patient Requires Transmission-Based Precautions: No Implantable device outside of the clinic excluding No Patient Has Alerts: No cellular tissue based products placed in the center since last visit: Has Dressing in Place as Prescribed: Yes Has Compression in Place as Prescribed: Yes Pain Present Now: Yes Electronic Signature(s) Signed: 10/14/2019 3:49:06 PM By: Baruch Gouty RN, BSN Entered By: Baruch Gouty on 10/14/2019 10:48:08 -------------------------------------------------------------------------------- Compression Therapy Details Patient Name: Date of Service: [name], [name] 10/14/2019 10:00 A M Medical Record Number: [id number] Patient Account Number: [id number] Date of Birth/Sex: Treating RN: 1955/11/14 (64 y.o. [name] Ylianna Almanzar: [name] Other Clinician: Referring Micha Erck: Treating Rockland Kotarski/Extender: [name] in Treatment: 357 Compression Therapy Performed for Wound Assessment:  Wound #3 Left,Medial Malleolus Performed By: Clinician Baruch Gouty, RN Compression Type: Rolena Infante Electronic Signature(s) Signed: 10/14/2019 3:49:06 PM By: Baruch Gouty RN, BSN Entered By: Baruch Gouty on 10/14/2019 11:07:22 -------------------------------------------------------------------------------- Encounter Discharge Information Details Patient Name: Date of Service: [name], [name] 10/14/2019 10:00 A M Medical Record Number: [id number] Patient Account Number: [id number] Date of Birth/Sex: Treating RN: 03/24/1956 (64 y.o. [name] Damon Baisch: [name] Other Clinician: Referring Tianni Escamilla: Treating Randie Tallarico/Extender: [name] in Treatment: 33 Encounter Discharge Information Items Discharge Condition: Stable Ambulatory Status: Ambulatory Discharge Destination: Home Transportation: Private Auto Accompanied By: self Schedule Follow-up Appointment: Yes Clinical Summary of Care: Patient Declined Electronic Signature(s) Signed: 10/14/2019 3:49:06 PM By: Baruch Gouty RN, BSN Entered By: Baruch Gouty on 10/14/2019 11:16:12 -------------------------------------------------------------------------------- Patient/Caregiver Education Details Patient Name: Date of Service: [name], [name] 5/14/2021andnbsp10:00 A M Medical Record Number: [id number] Patient Account Number: [id number] Date of Birth/Gender: Treating RN: 05/27/1956 (64 y.o. [name] Physician: [name] Other Clinician: Referring Physician: Treating Physician/Extender: [name] in Treatment: 357 Education Assessment Education Provided To: Patient Education Topics Provided Venous: Methods: Explain/Verbal Responses: Reinforcements needed, State content correctly Wound/Skin Impairment: Methods: Explain/Verbal Responses: Reinforcements needed, State content correctly Electronic  Signature(s) Signed: 10/14/2019 3:49:06 PM By: Baruch Gouty RN, BSN Entered By: Baruch Gouty on 10/14/2019 11:15:57 -------------------------------------------------------------------------------- Wound Assessment Details Patient Name: Date of Service: [name], [name] 10/14/2019 10:00 A M Medical Record Number: [id number] Patient Account Number: [id number] Date of Birth/Sex: Treating RN: [birth year] (64 y.o. [name] Ory Elting: [name] Other Clinician: Referring Misao Fackrell: Treating Tiffany Talarico/Extender: [name] in Treatment: 357 Wound Status Wound Number: 3 Primary Venous Leg Ulcer Etiology: Wound Location: Left, Medial Malleolus Wound Open Wounding Event: Gradually Appeared Status: Date Acquired: 11/15/2012 Comorbid Congestive Heart Failure, Peripheral Vascular Disease, Weeks Of Treatment: 357 History: Congestive Heart  Failure, End Stage Renal Disease, T obacco Use, Clustered Wound: No Chronic Obstructive Pulmonary Disease (COPD), Type 1 Diabetes Wound Measurements Length: (cm) 1.1 Width: (cm) 0.7 Depth: (cm) 0.1 Area: (cm) 0.605 Volume: (cm) 0.06 % Reduction in Area: 80.7% % Reduction in Volume: 90.4% Epithelialization: Small (1-33%) Tunneling: No Undermining: No Wound Description Classification: Full Thickness Without Exposed Support Structures Wound Margin: Flat and Intact Exudate Amount: Small Exudate Type: Serosanguineous Exudate Color: red, brown Foul Odor After Cleansing: No Slough/Fibrino Yes Wound Bed Granulation Amount: Large (67-100%) Exposed Structure Granulation Quality: Red, Pink Fascia Exposed: No Necrotic Amount: Small (1-33%) Fat Layer (Subcutaneous Tissue) Exposed: Yes Necrotic Quality: Adherent Slough Tendon Exposed: No Muscle Exposed: No Joint Exposed: No Bone Exposed: No Treatment Notes Wound #3 (Left, Medial Malleolus) 2. Periwound Care Barrier cream Moisturizing  lotion 3. Primary Dressing Applied Hydrofera Blue 4. Secondary Dressing Dry Gauze Other secondary dressing (specify in notes) 6. Support Layer Production assistant, radio Notes Building surveyor) Signed: 10/14/2019 3:49:06 PM By: Baruch Gouty RN, BSN Entered By: Baruch Gouty on 10/14/2019 11:06:26 -------------------------------------------------------------------------------- [place] Details Patient Name: Date of Service: [name], [name] 10/14/2019 10:00 A M Medical Record Number: [id number] Patient Account Number: [id number] Date of Birth/Sex: Treating RN: March 05, 1956 (64 y.o. [name] Tomara Youngberg: [name] Other Clinician: Referring Vonetta Foulk: Treating Orene Abbasi/Extender: [name] in Treatment: 357 Vital Signs Time Taken: 10:48 Temperature (F): 98.1 Height (in): 68 Pulse (bpm): 53 Source: Stated Respiratory Rate (breaths/min): 18 Weight (lbs): 127 Blood Pressure (mmHg): 139/63 Source: Stated Reference Range: 80 - 120 mg / dl Body Mass Index (BMI): 19.3 Electronic Signature(s) Signed: 10/14/2019 3:49:06 PM By: Baruch Gouty RN, BSN Entered By: Baruch Gouty on 10/14/2019 10:49:33

## 2019-10-15 NOTE — Progress Notes (Signed)
VEANNA, DOWER ([id number]) Visit Report for 10/14/2019 SuperBill Details Patient Name: Date of Service: [facility] [name], [name] 10/14/2019 Medical Record Number: [id number] Patient Account Number: [id number] Date of Birth/Sex: Treating RN: [birth year] (64 y.o. 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

## 2019-10-19 ENCOUNTER — Telehealth: Payer: Self-pay

## 2019-10-19 ENCOUNTER — Encounter (HOSPITAL_BASED_OUTPATIENT_CLINIC_OR_DEPARTMENT_OTHER): Payer: Medicare Other | Admitting: Physician Assistant

## 2019-10-19 ENCOUNTER — Other Ambulatory Visit: Payer: Self-pay

## 2019-10-19 DIAGNOSIS — L97322 Non-pressure chronic ulcer of left ankle with fat layer exposed: Secondary | ICD-10-CM | POA: Diagnosis not present

## 2019-10-19 NOTE — Progress Notes (Signed)
[name], [name] ([id number]) Visit Report for 10/19/2019 Arrival Information Details Patient Name: Date of Service: [facility], [name] 10/19/2019 10:00 A M Medical Record Number: [id number] Patient Account Number: [id number] Date of Birth/Sex: Treating RN: 30-Aug-1955 (64 y.o. [name] Primary Care [name]: [name] Other Clinician: Referring [name]: Treating [name]/Extender: [name] in Treatment: 38 Visit Information History Since Last Visit Added or deleted any medications: No Patient Arrived: Ambulatory Any new allergies or adverse reactions: No Arrival Time: 10:10 Had a fall or experienced change in No Accompanied By: self activities of daily living that may affect Transfer Assistance: None risk of falls: Patient Identification Verified: Yes Signs or symptoms of abuse/neglect since last visito No Secondary Verification Process Completed: Yes Hospitalized since last visit: No Patient Requires Transmission-Based Precautions: No Implantable device outside of the clinic excluding No Patient Has Alerts: No cellular tissue based products placed in the center since last visit: Has Dressing in Place as Prescribed: Yes Has Compression in Place as Prescribed: Yes Pain Present Now: Yes Electronic Signature(s) Signed: 10/19/2019 1:07:52 PM By: Baruch Gouty RN, BSN Entered By: Baruch Gouty on 10/19/2019 10:10:58 -------------------------------------------------------------------------------- Compression Therapy Details Patient Name: Date of Service: [name], [name] 10/19/2019 10:00 A M Medical Record Number: [id number] Patient Account Number: [id number] Date of Birth/Sex: Treating RN: Aug 14, 1955 (64 y.o. [name] Primary Care [name]: [name] Other Clinician: Referring [name]: Treating [name]/Extender: [name] in Treatment: 357 Compression Therapy Performed for Wound  Assessment: Wound #3 Left,Medial Malleolus Performed By: Clinician Baruch Gouty, RN Compression Type: Rolena Infante Electronic Signature(s) Signed: 10/19/2019 1:07:52 PM By: Baruch Gouty RN, BSN Entered By: Baruch Gouty on 10/19/2019 10:26:57 -------------------------------------------------------------------------------- Encounter Discharge Information Details Patient Name: Date of Service: [name], [name] 10/19/2019 10:00 A M Medical Record Number: [id number] Patient Account Number: [id number] Date of Birth/Sex: Treating RN: 06-Nov-1955 (64 y.o. [name] Primary Care [name]: [name] Other Clinician: Referring [name]: Treating [name]/Extender: [name] in Treatment: [phone] Encounter Discharge Information Items Discharge Condition: Stable Ambulatory Status: Cane Discharge Destination: Home Transportation: Private Auto Accompanied By: self Schedule Follow-up Appointment: Yes Clinical Summary of Care: Patient Declined Electronic Signature(s) Signed: 10/19/2019 1:07:52 PM By: Baruch Gouty RN, BSN Entered By: Baruch Gouty on 10/19/2019 10:28:45 -------------------------------------------------------------------------------- Patient/Caregiver Education Details Patient Name: Date of Service: [name], [name] 5/19/2021andnbsp10:00 A M Medical Record Number: [id number] Patient Account Number: [id number] Date of Birth/Gender: Treating RN: July 20, 1955 (64 y.o. [name] Primary Care Physician: [name] Other Clinician: Referring Physician: Treating Physician/Extender: [name] in Treatment: 16 Education Assessment Education Provided To: Patient Education Topics Provided Venous: Methods: Explain/Verbal Responses: Reinforcements needed, State content correctly Electronic Signature(s) Signed: 10/19/2019 1:07:52 PM By: Baruch Gouty RN, BSN Entered By: Baruch Gouty on  10/19/2019 10:28:26 -------------------------------------------------------------------------------- Wound Assessment Details Patient Name: Date of Service: [name], [name] 10/19/2019 10:00 A M Medical Record Number: [id number] Patient Account Number: [id number] Date of Birth/Sex: Treating RN: June 07, 1955 (64 y.o. [name] Primary Care [name]: [name] Other Clinician: Referring [name]: Treating [name]/Extender: [name] in Treatment: 357 Wound Status Wound Number: 3 Primary Venous Leg Ulcer Etiology: Wound Location: Left, Medial Malleolus Wound Open Wounding Event: Gradually Appeared Wounding Event: Gradually Appeared Status: Date Acquired: 11/15/2012 Comorbid Congestive Heart Failure, Peripheral Vascular Disease, Weeks Of Treatment: 357 History: Congestive Heart Failure,  End Stage Renal Disease, T obacco Use, Clustered Wound: No Chronic Obstructive Pulmonary Disease (COPD), Type 1 Diabetes Wound Measurements Length: (cm) 1.1 Width: (cm) 0.7 Depth: (cm) 0.1 Area: (cm) 0.605 Volume: (cm) 0.06 % Reduction in Area: 80.7% % Reduction in Volume: 90.4% Epithelialization: Small (1-33%) Tunneling: No Undermining: No Wound Description Classification: Full Thickness Without Exposed Support Structures Wound Margin: Flat and Intact Exudate Amount: Small Exudate Type: Serosanguineous Exudate Color: red, brown Foul [name] After Cleansing: No Slough/Fibrino Yes Wound Bed Granulation Amount: Large (67-100%) Exposed Structure Granulation Quality: Red, Pink Fascia Exposed: No Necrotic Amount: None Present (0%) Fat Layer (Subcutaneous Tissue) Exposed: Yes Tendon Exposed: No Muscle Exposed: No Joint Exposed: No Bone Exposed: No Treatment Notes Wound #3 (Left, Medial Malleolus) 2. Periwound Care Barrier cream Moisturizing lotion 3. Primary Dressing Applied Hydrofera Blue 4. Secondary Dressing Dry  Gauze Kerramax/Xtrasorb 6. Support Layer Best boy) Signed: 10/19/2019 1:07:52 PM By: Baruch Gouty RN, BSN Entered By: Baruch Gouty on 10/19/2019 10:26:10 -------------------------------------------------------------------------------- Vitals Details Patient Name: Date of Service: [name], [name] 10/19/2019 10:00 A M Medical Record Number: [id number] Patient Account Number: [id number] Date of Birth/Sex: Treating RN: 1956-02-11 (64 y.o. [name] Primary Care Alvera Tourigny: [name] Other Clinician: Referring Odalys Win: Treating Audine Mangione/Extender: [name] in Treatment: 357 Vital Signs Time Taken: 10:11 Temperature (F): 98.0 Height (in): 68 Pulse (bpm): 66 Source: Stated Respiratory Rate (breaths/min): 18 Weight (lbs): 127 Blood Pressure (mmHg): 107/43 Source: Stated Reference Range: 80 - 120 mg / dl Body Mass Index (BMI): 19.3 Electronic Signature(s) Signed: 10/19/2019 1:07:52 PM By: Baruch Gouty RN, BSN Entered By: Baruch Gouty on 10/19/2019 10:11:43

## 2019-10-19 NOTE — Telephone Encounter (Addendum)
DOS 12/15/19  ARTHRODESIS ANKLE LT  - [id number] Arthrodesis subtalar lt - [id number]  [place]: 1. [phone]:  ARTHRODESIS, ANKLE, OPEN Status: [place], [place]; UQX:[id number], [place] [phone]  [phone]:  ARTHRODESIS; SUBTALAR Status: CERTIFIED  Servicing [facility] [place]; PTC:[id number], [place] [phone]  Status: APPROVED  Authorization #: [id number] GOOD FROM 11/03/2019 - 02/01/2020

## 2019-10-20 ENCOUNTER — Telehealth: Payer: Self-pay | Admitting: *Deleted

## 2019-10-20 NOTE — Telephone Encounter (Signed)
Pt left her name and phone number only.

## 2019-10-20 NOTE — Telephone Encounter (Signed)
Pt would like to know the status of her surgery scheduling and wheelchair information.

## 2019-10-21 ENCOUNTER — Encounter (HOSPITAL_BASED_OUTPATIENT_CLINIC_OR_DEPARTMENT_OTHER): Payer: Medicare Other | Admitting: Internal Medicine

## 2019-10-25 ENCOUNTER — Other Ambulatory Visit: Payer: Self-pay | Admitting: Podiatry

## 2019-10-25 DIAGNOSIS — M21542 Acquired clubfoot, left foot: Secondary | ICD-10-CM

## 2019-10-25 DIAGNOSIS — M21862 Other specified acquired deformities of left lower leg: Secondary | ICD-10-CM

## 2019-10-25 DIAGNOSIS — M25572 Pain in left ankle and joints of left foot: Secondary | ICD-10-CM

## 2019-10-26 NOTE — Progress Notes (Signed)
[name], [name] ([id number]) Visit Report for 10/19/2019 SuperBill Details Patient Name: Date of Service: [facility] [name], [name] 10/19/2019 Medical Record Number: [id number] Patient Account Number: [id number] Date of Birth/Sex: Treating RN: 1955/07/28 (65 y.o. 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

## 2019-10-28 ENCOUNTER — Telehealth: Payer: Self-pay | Admitting: *Deleted

## 2019-10-28 ENCOUNTER — Encounter (HOSPITAL_BASED_OUTPATIENT_CLINIC_OR_DEPARTMENT_OTHER): Payer: Medicare Other | Admitting: Internal Medicine

## 2019-10-28 DIAGNOSIS — L97322 Non-pressure chronic ulcer of left ankle with fat layer exposed: Secondary | ICD-10-CM | POA: Diagnosis not present

## 2019-10-28 NOTE — Progress Notes (Signed)
[name], [name] ([id number]) Visit Report for 10/28/2019 HPI Details Patient Name: Date of Service: [facility] [name], [name] 10/28/2019 10:00 A M Medical Record Number: [id number] Patient Account Number: [id number] Date of Birth/Sex: Treating RN: April 29, 1956 (64 y.o. [name] Primary Care Provider: [name] Other Clinician: Referring Provider: Treating Provider/Extender: [place] Caster in Treatment: 359 History of Present Illness HPI Description: the remaining wound is over the left medial ankle. Similar wound over the right medial ankle healed largely with use of Apligraf. Most recently we have been using Hydrofera Blue over this wound with considerable improvement. The patient has been extensively worked up in the past for her venous insufficiency and she is not a candidate for antireflux surgery although I have none of the details available currently. 08/24/14; considerable improvement today. About 50% of this wound areas now epithelialized. The base of the wound appears to be healthier granulation.as opposed to last week when she had deteriorated a considerable improvement 08/17/14; unfortunately the wound has regressed somewhat. The areas of epithelialization from the superior aspect are not nearly as healthy as they were last week. The patient thinks her Hydrofera Blue slipped. 09/07/14; unfortunately the area has markedly regressed in the 2 weeks since I've seen this. There is an odor surrounding erythema. The healthy granulation tissue that we had at the base of the wound now is a dusky color. The nurse reports green drainage 09/14/14; the area looks somewhat better than last week. There is less erythema and less drainage. The culture I did did not show any growth. Nevertheless I think it is better to continue the Cipro and doxycycline for a further week. The remaining wound area was debridement. 09/21/14. Wound did not require debridement last week. Still  less erythema and less drainage. She can complete her antibiotics. The areas of epithelialization in the superior aspect of the wound do not look as healthy as they did some weeks ago 10/05/14 continued improvement in the condition of this wound. There is advancing epithelialization. Less aggressive debridement required 10/19/14 continued improvement in the condition and volume of this wound. Less aggressive debridement to the inferior part of this to remove surface slough and fibrinous eschar 11/02/14 no debridement is required. The surface granulation appears healthy although some of her islands of epithelialization seem to have regressed. No evidence of infection 11/16/14; lites surface debridement done of surface eschar. The wound does not look to be unhealthy. No evidence of infection. Unfortunately the patient has had podiatry issues in the right foot and for some reason has redeveloped small surface ulcerations in the medial right ankle. Her original presentation involved wounds in this area 11/23/14 no debridement. The area on the right ankle has enlarged. The left ankle wound appears stable in terms of the surface although there is periwound inflammation. There has been regression in the amount of new skin 11/30/14 no debridement. Both wound areas appear healthy. There was no evidence of infection. The the new area on the right medial ankle has enlarged although that both the surfaces appear to be stable. 12/07/14; Debridement of the right medial ankle wound. No no debridement was done on the left. 12/14/14 no major change in and now bilateral medial ankle wounds. Both of these are very painful but the no overt evidence of infection. She has had previous venous ablation 12/21/14; patient states that her right medial ankle wound is considerably more painful last week than usual. Her left is also somewhat painful. She could not  tolerate debridement. The right medial ankle wound has fibrinous surface  eschar 12/28/14 this is a patient with severe bilateral venous insufficiency ulcers. For a considerable period of time we actually had the one on the right medial ankle healed however this recently opened up again in June. The left medial ankle wound has been a refractory area with some absent flows. We had some success with Hydrofera Blue on this area and it literally closed by 50% however it is recently opened up Foley. Both of these were debridement today of surface eschar. She tolerates this poorly 01/25/15: No change in the status of this. Thick adherent escar. Very poor tolerance of any attempt at debridement. I had healed the right medial malleolus wound for a considerable amount of time and had the left one down to about 50% of the volume although this is totally regressed over the last 48 weeks. Further the right leg has reopened. she is trying to make a appointment with pain and vascular, previous ablations with Dr. Aleda Grana. I do not believe there is an arterial insufficiency issue here 02/01/15 the status of the adherent eschar bilaterally is actually improved. No debridement was done. She did not manage to get vascular studies done 02/08/15 continued debridement of the area was done today. The slough is less adherent and comes off with less pressure. There is no surrounding infection peripheral pulses are intact 02/15/15 selective debridement with a disposable curette. Again the slough is less adherent and comes off with less difficulty. No surrounding infection peripheral pulses are intact. 02/22/15 selective debridement of the right medial ankle wound. Slough comes off with less difficulty. No obvious surrounding infection peripheral pulses are intact I did not debridement the one on the left. Both of these are stable to improved 03/01/15 selective debridement of both wound areas using a curette to. Adherent slough cup soft with less difficulty. No obvious surrounding infection.  The patient tells me that 2 days ago she noted a rash above the right leg wrap. She did not have this on her lower legs when she change this over she arrives with widespread left greater than right almost folliculitis-looking rash which is extremely pruritic. I don't see anything to culture here. There is no rash on the rest of her body. She feels well systemically. 03/08/15; selective debridement of both wounds using a curette. Base of this does not look unhealthy. She had limegreen drainage coming out of the left leg wound and describes a lot of drainage. The rash on her left leg looks improved to. No cultures were done. 03/22/15; patient was not here last week. Basal wounds does not look healthy and there is no surrounding erythema. No drainage. There is still a rash on the left leg that almost looks vasculitic however it is clearly limited to the top of where the wrap would be. 04/05/15; on the right required a surgical debridement of surface eschar and necrotic subcutaneous tissue. I did not debridement the area on the left. These continue to be large open wounds that are not changing that much. We were successful at one point in healing the area on the right, and at the same time the area on the left was roughly half the size of current measurements. I think a lot of the deterioration has to do with the prolonged time the patient is on her feet at work 04/19/15 I attempted-like surface debridement bilaterally she does not tolerate this. She tells me that she was in allergic care yesterday with  extreme pain over her left lateral malleolus/ankle and was told that she has an "sprain" 05/03/15; large bilateral venous insufficiency wounds over the medial malleolus/medial aspect of her ankles. She complains of copious amounts of drainage and his usual large amounts of pain. There is some increasing erythema around the wound on the right extending into the medial aspect of her foot to. historically she  came in with these wounds the right one healed and the left one came down to roughly half its current size however the right one is reopened and the left is expanded. 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 05/10/15 large bilateral venous insufficiency wounds. There is less adherence surface left however the surface culture that I did last week grew pseudomonas therefore bilateral selective debridement score necessary. There is surrounding erythema. The patient describes severe bilateral drainage and a lot of pain in the left ankle. Apparently her podiatrist was were ready to do a cortisone shot 05/17/15; the patient complains of pain and again copious amounts of drainage. 05/24/15; we used Iodo flex last week. Patient notes considerable improvement in wound drainage. Only needed to change this once. 05/31/15; we continued Iodoflex; the base of these large wounds bilaterally is not too bad but there is probably likely a significant bioburden here. I would like to debridement just doesn't tolerate it. 06/06/14 I would like to continue the Iodoflex although she still hasn't managed to obtain supplies. She has bilateral medial malleoli or large wounds which are mostly superficial. Both of them are covered circumferentially with some nonviable fibrinous slough although she tolerates debridement very poorly. She apparently has an appointment for an ablation on the right leg by interventional radiology. 06/14/15; the patient arrives with the wounds and static condition. We attempted a debridement although she does not do well with this secondary to pain. I 07/05/15; wounds are not much smaller however there appears to be a cleaner granulating base. The left has tight fibrinous slough greater than the right. Debridement is tolerated poorly due to pain. Iodoflex is done more for these wounds in any of the multitude of different dressings I have tried on the left  1 and then subsequently the right. 07/12/15; no change in the condition of this wound. I am able to do an aggressive debridement on the right but not the left. She simply cannot tolerate it. We have been using Iodoflex which helps somewhat. It is worthwhile remembering that at one point we healed the right medial ankle wound and the left was about 25% of the current circumference. We have suggested returning to vascular surgery for review of possible further ablations for one reason or another she has not been able to do this. 07/26/15 no major change in the condition of either wound on her medial ankle. I did not attempt to debridement of these. She has been aggressively scrubbing these while she is in the shower at home. She has her supply of Iodoflex which seems to have done more for these wounds then anything I have put on recently. 08/09/15 wound area appears larger although not verified by measurements. Using Iodoflex 09/05/2015 -- she was here for avisit today but had significant problems with the wound and I was asked to see her for a physician opinion. I have summarize that this lady has had surgery on her left lower extremity about 10 years ago where the possible veins stripping was done. She has  had an opinion from interventional radiology around November 2016 where no further sclerotherapy was ordered. The patient works 12 hours a day and stands on a concrete floor with work boots and is unable to get the proper compression she requires and cannot elevate her limbs appropriately at any given time. She has recently grown Pseudomonas from her wound culture but has not started her ciprofloxacin which was called in for her. 09/13/15 this continues to be a difficult situation for this patient. At one point I had this wound down to a 1.5 x 1.5" wound on her left leg. This is deteriorated and the right leg has reopened. She now has substantial wounds on her medial calcaneus, malleoli and into her  lower leg. One on the left has surface eschar but these are far too painful for me to debridement here. She has a vascular surgery appointment next week to see if anything can be done to help here. I think she has had previous ablations several years ago at [facility]. She has no major edema. She tells me that she did not get product last time [facility] and went for several days without it. She continues to work in work boots 12 hours a day. She cannot get compression/4-layer under her work boots. 09/20/15 no major change. Periwound edema control was not very good. Her point with pain and vascular is next Wednesday the 25th 09/28/15; the patient is seen vascular surgery and is apparently scheduled for repeat duplex ultrasounds of her bilateral lower legs next week. 10/05/15; the patient was seen by Dr. Doren Custard of vascular surgery. He feels that she should have arterial insufficiency excluded as cause/contributed to her nonhealing stage she is therefore booked for an arteriogram. She has apparently monophasic signals in the dorsalis pedis pulses. She also of course has known severe chronic venous insufficiency with previous procedures as noted previously. I had another long discussion with the patient today about her continuing to work 12 hour shifts. I've written her out for 2 months area had concerns about this as her work location is currently undergoing significant turmoil and this may lead to her termination. She is aware of this however I agree with her that she simply cannot continue to stand for 12 hours multiple days a week with the substantial wound areas she has. 10/19/15; the Dr. Doren Custard appointment was largely for an arteriogram which was normal. She does not have an arterial issue. He didn't make a comment about her chronic venous insufficiency for which she has had previous ablations. Presumably it was not felt that anything additional could be done. The patient is now out of work as I  prescribed 2 weeks ago. Her wounds look somewhat less aggravated presumably because of this. I felt I would give debridement another try today 10/25/15; no major change in this patient's wounds. We are struggling to get her product that she can afford into her own home through her insurance. 11/01/15; no major change in the patient's wounds. I have been using silver alginate as the most affordable product. I spoke to Dr. Marla Roe last week with her requested take her to the OR for surgical debridement and placement of ACEL. Dr. Marla Roe told me that she would be willing to do this however [facility] will not cover this, fortunately the patient has [place] healthcare of some variant 11/08/15; no major change in the patient's wounds. She has been completely nonviable surface that this but is in too much pain with any  attempted debridement are clinic. I have arranged for her to see Dr. Marla Roe ham of plastic surgery and this appointment is on Monday. I am hopeful that they will take her to the OR for debridement, possible ACEL ultimately possible skin graft 11/22/15 no major change in the patient's wounds over her bilateral medial calcaneus medial malleolus into the lower legs. Surface on these does not look too bad however on the left there is surrounding erythema and tenderness. This may be cellulitis or could him sleepy tinea. 11/29/15; no major changes in the patient's wounds over her bilateral medial malleolus. There is no infection here and I don't think any additional antibiotics are necessary. There is now plan to move forward. She sees Dr. Marla Roe in a week's time for preparation for operative debridement and ACEL placement I believe on 7/12. She then has a follow-up appointment with Dr. Marla Roe on 7/21 12/28/15; the patient returns today having been taken to the [place] by Dr. Marla Roe 12/12/15 she underwent debridement, intraoperative cultures [which were negative]. She had  placement of a wound VAC. Parent really ACEL was not available to be placed. The wound VAC foam apparently adhered to the wound since then she's been using silver alginate, Xeroform under Ace wraps. She still says there is a lot of drainage and a lot of pain 01/31/16; this is a patient I see monthly. I had referred her to Dr. Marla Roe him of plastic surgery for large wounds on her bilateral medial ankles. She has been to the OR twice once in early July and once in early August. She tells me over the last 3 weeks she has been using the wound VAC with ACEL underneath it. On the right we've simply been using silver alginate. Under Kerlix Coban wraps. 02/28/16; this is a patient I'm currently seeing monthly. She is gone on to have a skin graft over her large venous insufficiency ulcer on the left medial ankle. This was done by Dr. Marla Roe him. The patient is a bit perturbed about why she didn't have one on her right medial ankle wound. She has been using silver alginate to this. 03/06/16; I received a phone call from her plastic surgery Dr. Marla Roe. She expressed some concern about the viability of the skin graft she did on the left medial ankle wound. Asked me to place Endoform on this. She told me she is not planning to do a subsequent skin graft on the right as the left one did not take very well. I had placed Hydrofera Blue on the right 03/13/16; continue to have a reasonably healthy wound on the right medial ankle. Down to 3 mm in terms of size. There is epithelialization here. The area on the left medial ankle is her skin graft site. I suppose the last week this looks somewhat better. She has an open area inferiorly however in the center there appears to be some viable tissue. There is a lot of surface callus and eschar that will eventually need to come off however none of this looked to be infected. Patient states that the is able to keep the dressing on for several days which is an  improvement. 03/20/16 no major change in the circumference of either wound however on the left side the patient was at Dr. Eusebio Friendly office and they did a debridement of left wound. 50% of the wound seems to be epithelialized. I been using Endoform on the left Hydrofera Blue in the right 03/27/16; she arrives today with her wound is not  looking as healthy as they did last week. The area on the right clearly has an adherent surface to this a very similar surface on the left. Unfortunately for this patient this is all too familiar problem. Clearly the Endoform is not working and will need to change that today that has some potential to help this surface. She does not tolerate debridement in this clinic very well. She is changing the dressing wants 04/03/16; patient arrives with the wounds looking somewhat better especially on the right. Dr. Migdalia Dk change the dressing to silver alginate when she saw her on Monday and also sold her some compression socks. The usefulness of the latter is really not clear and woman with severely draining wounds. 04/10/16; the patient is doing a bit of an experiment wearing the compression stockings that Dr. Migdalia Dk provided her to her left leg and the out of legs based dressings that we provided to the right. 05/01/16; the patient is continuing to wear compression stockings Dr. Migdalia Dk provided her on the left that are apparently silver impregnated. She has been using Iodoflex to the right leg wound. Still a moderate amount of drainage, when she leaves here the wraps only last for 4 days. She has to change the stocking on the left leg every night 05/15/16; she is now using compression stockings bilaterally provided by Dr. Marla Roe. She is wearing a nonadherent layer over the wounds so really I don't think there is anything specific being done to this now. She has some reduction on the left wound. The right is stable. I think all healing here is being done without a  specific dressing 06/09/16; patient arrives here today with not much change in the wound certainly in diameter to large circular wounds over the medial aspect of her ankle bilaterally. Under the light of these services are certainly not viable for healing. There is no evidence of surrounding infection. She is wearing compression stockings with some sort of silver impregnation as prescribed by Dr. Marla Roe. She has a follow-up with her tomorrow. 06/30/16; no major change in the size or condition of her wounds. These are still probably covered with a nonviable surface. She is using only her purchase stockings. She did see Dr. Marla Roe who seemed to want to apply Dakin's solution to this I'm not extreme short what value this would be. I would suggest Iodoflex which she still has at home. 07/28/16; I follow Mrs. [place] episodically along with Dr. Marla Roe. 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 She has been applying a topical collagen ointment to both wounds with Adaptic. I don't think Dr. Marla Roe is planning to take her back to the OR. 08/19/16; I follow Mrs. Jeneen Rinks on a monthly basis along with Dr. Marla Roe of plastic surgery. 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 I been following her for a number of years. At one point I was able to get the right medial malleolus wound to heal and had the left medial malleolus down to about half its current size however and I had to send her to plastic surgery for an operative debridement. Since then things have been stable to slightly improve the area on the right is slightly better one in the left about the same although there is much less adherent surface than I'm used to with this patient. She is using some form of liquid collagen gel that Dr. Marla Roe provided a Kerlix  cover with the patient's own pressure stockings.  She tells me that she has extreme pain in both ankles and along the lateral aspect of both feet. She has been unable to work for some period of time. She is telling me she is retiring at the beginning of April. She sees Dr. Doran Durand of orthopedics next week 09/22/16; patient has not seen Dr. Marla Roe since the last time she is here. I'm not really sure what she is using to the wounds other than bits and pieces of think she had left over including most recently Hydrofera Blue. She is using juxtalite stockings. She is having difficulty with her husband's recent illness "stroke". She is having to transport him to various doctors appointments. Dr. Marla Roe left her the option of a repeat debridement with ACEL however she has not been able to get the time to follow-up on this. She continues to have a fair amount of drainage out of these wounds with certainly precludes leaving dressings on all week 10/13/16; patient has not seen Dr. Marla Roe since she was last in our clinic. I'm not really sure what she is doing with the wounds, we did try to get her [facility] and I think she is actually using this most of the time. Because of drainage she states she has to change this every second day although this is an improvement from what she used to do. She went to see Dr. Doran Durand who did not think she had a muscular issue with regards to her feet, he referred her to a neurologist and I think the appointment is sometime in June. I changed her back to Iodoflex which she has used in the past but not recently. 11/03/16; the patient has been using Iodoflex although she ran out of this. Still claims that there is a lot of drainage although the wound does not look like this. No surrounding erythema. She has not been back to see Dr. Marla Roe 11/24/16; the patient has been using Iodoflex again but she ran out of it 2 or 3 days ago. There is no major change in the condition of either one of these wounds in fact they are  larger and covered in a thick adherent surface slough/nonviable tissue especially on the left. She does not tolerate mechanical debridement in our clinic. Going back to see Dr. Marla Roe of plastic surgery for an operative debridement would seem reasonable. 12/15/16; the patient has not been back to see Dr. Marla Roe. She is been dealing with a series of illnesses and her husband which of monopolized her time. She is been using Sorbact which we largely supplied. She states the drainage is bad enough that it maximum she can go 2-3 days without changing the dressing 01/12/2017 -- the patient has not been back for about 4 weeks and has not seen Dr. Marla Roe not does she have any appointment pending. 01/23/17; patient has not seen Dr. Marla Roe even though I suggested this previously. She is using Santyl that was suggested last week by Dr. Con Memos this Cost her $16 through her insurance which is indeed surprising 02/12/17; continuing Santyl and the patient is changing this daily. A lot of drainage. She has not been back to see plastic surgery she is using an Ace wrap. Our intake nurse suggested wrap around stockings which would make a good reasonable alternative 02/26/17; patient is been using Santyl and changing this daily due to drainage. She has not been to see plastic surgery she uses in April Ace wrap to control the  edema. She did obtain extremitease stockings but stated that the edema in her leg was to big for these 03/20/17; patient is using Santyl and Anasept. Surfaces looked better today the area on the right is actually measuring a little smaller. She has states she has a lot of pain in her feet and ankles and is asking for a consult to pain control which I'll try to help her with through our case manager. 04/10/17; the patient arrives with better-looking wound surfaces and is slightly smaller wound on the left she is using a combination of Santyl and Anasept. She has an appointment or at least as  started in the pain control center associated with [facility] 05/14/17; this is a patient who I followed for a prolonged period of time. 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 At one point I had this down to a much smaller wound on the left however these reopened and we've never been able to get these to heal. She has been using Santyl and Anasept gel although 2 weeks ago she ran out of the Anasept gel. She has a stable appearance of the wound. She is going to the wound care clinic at [facility]. They wanted do a nerve block/spinal block although she tells me she is reluctant to go forward with that. 05/21/17; this is a patient I have followed for many years. 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 Chronic pain and deformity in her ankles as well. She is been to see plastic surgery as well as orthopedics. Using PolyMem AG most recently/Kerramax/ABDs and 2 layer compression. She has managed to keep this on and she is coming in for a nurse check to change the dressing on Tuesdays, we see her on Fridays 06/05/17; really quite a good looking surface and the area especially on the right medial has contracted in terms of dimensions. Well granulated healthy-looking tissue on both sides. Even with an open curet there is nothing that even feels abnormal here. This is as good as I've seen this in quite some time. We have been using PolyMem AG and bringing her in for a nurse check 06/12/17; really quite good surface on both of these wounds. The right medial has contracted a bit left is not. We've been using PolyMem and AG and she is coming in for a nurse visit 06/19/17; we have been using PolyMem AG and bringing her in for a nurse check. Dimensions of her wounds are not better but the surfaces looked better bilaterally. She complained of bleeding last night and the left wound and increasing pain bilaterally. She states her wound pain is more  neuropathic than just the wounds. There was some suggestion that this was radicular from her pain management doctor in talking to her it is really difficult to sort this out. 06/26/17; using PolyMem and AG and bringing her in for a nurse check as All of this and reasonably stable condition. Certainly not improved. The dimensions on the lateral part of the right leg look better but not really measuring better. The medial aspect on the left is about the same. 07/03/16; we have been using PolyMen AG and bringing her in for a nurse check to change the dressings as the wounds have drainage which precludes once weekly changing. We are using all secondary absorptive dressings.our intake nurse is brought up the idea of using a wound VAC/snap VAC on the wound to help with the drainage to see if this would  result in some contraction. This is not a bad idea. The area on the right medial is actually looking smaller. Both wounds have a reasonable-looking surface. There is no evidence of cellulitis. The edema is well controlled 07/10/17; the patient was denied for a snap VAC by her insurance. The major issue with these wounds continues to be drainage. We are using wicked PolyMem AG and she is coming in for a nurse visit to change this. The wounds are stable to slightly improved. The surface looks vibrant and the area on the right certainly has shrunk in size but very slowly 07/17/17; the patient still has large wounds on her bilateral medial malleoli. Surface of both of these wounds looks better. The dimensions seem to come and go but no consistent improvement. There is no epithelialization. We do not have options for advanced treatment products due to insurance issues. They did not approve of the wound VAC to help control the drainage. More recently we've been using PolyMem and AG wicked to allow drainage through. We have been bringing her in for a nurse visit to change this. We do not have a lot of options for wound  care products and the home again due to insurance issues 07/24/17; the patient's wound actually looks somewhat better today. No drainage measurements are smaller still healthy-looking surface. We used silver collagen under PolyMen started last week. We have been bringing her in for a dressing change 07/31/17; patient's wound surface continued to look better and I think there is visible change in the dimensions of the wound on the right. Rims of epithelialization. We have been using silver collagen under PolyMen and bringing her in for a dressing change. There appears to be less drainage although she is still in need of the dressing change 08/07/17. Patient's wound surface continues to look better on both sides and the area on the right is definitely smaller. We have been using silver collagen and PolyMen. She feels that the drainage has been it has been better. I asked her about her vascular status. She went to see Dr. Aleda Grana at [facility] and had some form of ablation. I don't have much detail on this. I haven't my notes from 2016 that she was not a candidate for any further ablation but I don't have any more information on this. We had referred her to vein and vascular I don't think she ever went. He does not have a history of PAD although I don't have any information on this either. We don't even have ABIs in our record 08/14/17; we've been using silver collagen and PolyMen cover. And putting the patient and compression. She we are bringing her in as a nurse visit to change this because ofarge amount of drainage. We didn't the ABIs in clinic today since they had been done in many moons 1.2 bilaterally. She has been to see vein and vascular however this was at [facility] and she had ablation although I really don't have any information on this all seemed biking get a report. She is also been operatively debrided by plastic surgery and had a cell placed probably 8-12 months ago. This didn't have  a major effect. We've been making some gains with current dressings 08/19/17-She is here in follow-up evaluation for bilateral medial malleoli ulcers. She continues to tolerate debridement very poorly. We will continue with recently changed topical treatment; if no significant improvement may consider switching to Iodosorb/Iodoflex. 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 She has been using silver collagen and PolyMem. I believe she has been used and tried on Iodoflex before. 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 These have been very refractory over time. We have been using silver collagen and PolyMen. She is coming in once a week for a doctor's and once a week for nurse visits. We are actually making some progress 09/18/17; the patient's wounds are smaller especially on the right medial. She arrives today to upset to consider even washing these off with Anasept which I think is been part of the reason this is been closing. We've been using collagen covered in PolyMen otherwise. It is noted that she has a small area of folliculitis on the right medial calf that. As we are wrapping her legs I'll give her a short course of doxycycline to make sure this doesn't amount to anything. She is a long list of complaints today including imbalance, shortness of breath on exertion, inversion of her left ankle. With regards to the latter complaints she is been to see orthopedics and they offered her a tendon release surgery I believe but wanted her wounds to be closed first. I have recommended she go see her primary physician with regards to everything else. 09/25/17; patient's wounds are about the same size. We have made some progress bilaterally although not in recent  weeks. She will not allow me T wash these wounds with Anasept even if she is doing her cell. Wheeze we've been using collagen covered in PolyMen. Last week she had a small area of folliculitis this is now opened into a small wound. She completed 5 days of trimethoprim sulfamethoxazole 10/02/17; unfortunately the area on her left medial ankle is worse with a larger wound area towards the Achilles. The patient complains of a lot of pain. She will not allow debridement although visually I don't think there is anything to debridement in any case. We have been using silver collagen and PolyMen for several months now. Initially we are making some progress although I'm not really seeing that today. We will move back to [facility]. His admittedly this is a bit of a repeat however I'm hoping that his situation is different now. The patient tells me she had her leg on the left give out on her yesterday this is process some pain. 10/09/17; the patient is seen twice a week largely because of drainage issues coming out of the chronic medial bimalleolar wounds that are chronic. Last week the dimensions of the one on the left looks a little larger I changed her to [facility]. She comes in today with a history of terrible pain in the bilateral wound areas. She will not allow debridement. She will not even allow a tissue culture. There is no surrounding erythema no no evidence of cellulitis. We have been putting her Kerlix Coban man. She will not allow more aggressive compression as there was a suggestion to put her in 3 layer wraps. 10/16/17; large wounds on her bilateral medial malleoli. These are chronic. Not much change from last week. The surface looks have healthy but absolutely no epithelialization. A lot of pain little less so of drainage. She will not allow debridement or even washing these off in the vigorous fashion with Anasept. 10/23/17; large wounds on her bilateral malleoli which are  chronic. Some  improvement in terms of size perhaps on the right since last time I saw these. She states that after we increased the 3 layer compression there was some bleeding, when she came in for a nurse visit she did not want 3 layer compression put back on about our nurse managed to convince her. She has known chronic venous visit issues and I'm hoping to get her to tolerate the 3 layer compression. using Hydrofera Blue 10/30/17; absolutely no change in the condition of either wound although we've had some improvement in dimensions on the right.. Attempted to put her in 3 layer compression she didn't tolerated she is back in 2 layer compression. We've been using Hydrofera Blue We looked over her past records. She had venous reflux studies in November 2016. There was no evidence of deep venous reflux on the right. Superficial vein did not show the greater saphenous vein at think this is been previously ablated the small saphenous vein was within normal limits. The left deep venous system showed no DVT the vessels were positive for deep venous reflux in the posterior tibial veins at the ankle. The greater saphenous vein was surgically absent small saphenous vein was within normal limits. She went to vein and vascular at [facility]. I believe she had an ablation on the left greater saphenous vein. I'll update her reflux studies perhaps ever reviewed by vein and vascular. We've made absolutely no progress in these wounds. Will also try to read and TheraSkins through her insurance 11/06/17; W the patient apparently has a 2 week follow-up with vein and vascular I like him to review the whole issue with regards to her previous vascular workup by Dr. Aleda Grana. We've really made no progress on these wounds in many months. She arrives today with less viable looking surface on the left medial ankle wound. This was apparently looking about the same on Tuesday when she was here for nurse visit. 11/13/17; deep tissue  culture I did last time of the left lower leg showed multiple organisms without any predominating. In particular no Staphylococcus or group A strep were isolated. We sent her for venous reflux studies. She's had a previous left greater saphenous vein stripping and I think sclerotherapy of the right greater saphenous vein. She didn't really look at the lesser saphenous vein this both wounds are on the medial aspect. She has reflux in the common femoral vein and popliteal vein and an accessory vein on the right and the common femoral vein and popliteal vein on the left. I'm going to have her go to see vein and vascular just the look over things and see if anything else beside aggressive compression is indicated here. We have not been able to make any progress on these wounds in spite of the fact that the surface of the wounds is never look too bad. 11/20/17; no major change in the condition of the wounds. Patient reports a large amount of drainage. She has a lot of complaints of pain although enlisting her today I wonder if some of this at least his neuropathic rather than secondary to her wounds. She has an appointment with vein and vascular on 12/30/17. The refractory nature of these wounds in my mind at least need vein and vascular to look over the wounds the recent reflux studies we did and her history to see if anything further can be done here. I also note her gait is deteriorated quite a bit. Looks like she has inversion of her  foot on the right. She has a bilateral Trendelenburg gait. I wonder if this is neuropathic or perhaps multilevel radicular. 11/27/17; her wounds actually looks slightly better. Healthy-looking granulation tissue a scant amount of epithelialization. [place] healthcare will not pay for [employer]. They will play for tri layer Oasis and Dermagraft. This is not a diabetic ulcer. We'll try for the tri layer Oasis. She still complains of some drainage. She has a vein and vascular  appointment on 12/30/17 12/04/17; the wounds visually look quite good. Healthy-looking granulation with some degree of epithelialization. We are still waiting for response to our request for trial to try layer Oasis. Her appointment with vascular to review venous and arterial issues isn't sold the end of July 7/31. Not allow debridement or even vigorous cleansing of the wound surface. 12/18/17; slightly smaller especially on the right. 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 We've been using Hydrofera Blue. We still are looking into triple layer Oasis through her insurance 01/08/18 on evaluation today patient's wound actually appears to be showing signs of good improvement at this point in time. She has been tolerating the dressing changes without complication. Fortunately there does not appear to be any evidence of infection at this point in time. We have been utilizing silver nitrate which does seem to be of benefit for her which is also good news. Overall I'm very happy with how things seem to be both regards appearance as well as measurement. Patient did see Dr. Bridgett Larsson for evaluation on 12/30/17. In his assessment he felt that stripping would not likely add much more than chronic compression to the patient's healing process. His recommendation was to follow-up in three months with Dr. Doren Custard if she hasn't healed in order to consider referral back to you and see vascular where she previously was in a trial and was able to get her wound to heal. I'll be see what she feels she when you staying compression and he reiterated this as well. 01/13/18 on evaluation today patient appears to actually be doing very well in regard to her bilateral medial malleolus ulcers. She seems to have tolerated the chemical cauterization with silver nitrate last week she did have some pain through that evening but fortunately states that I'll be see since it seems to be doing better she is overall  pleased with the progress. 01/21/18; really quite a remarkable improvement since I've last seen these wounds. We started using silver nitrate specially on the islands of hyper granulation which for some reason her around the wound circumference. This is really done quite nicely. Primary dressing Hydrofera Blue under 4 layer compression. She seems to be able to hold out without a nurse rewrap. Follow-up in 1 week 01/28/18; we've continued the hydrofera blue but continued with chemical cauterization to the wound area that we started about a month ago for irregular hyper granulation. She is made almost stunning improvement in the overall wound dimensions. I was not really expecting this degree of improvement in these chronic wounds 02/05/18; we continue with Hydrofera Bluebut of also continued the aggressive chemical cauterization with silver nitrate. We made nice progress with the right greater than left wound. 02/12/18. We continued with Hydrofera Blue after aggressive chemical cauterization with silver nitrate. We appear to be making nice progress with both wound areas 02/19/2018; we continue with [facility] after washing the wounds vigorously with Anasept spray and chemical cauterization with silver nitrate. We are making excellent progress. The area on the right's just about closed 02/26/2018.  The area on the left medial ankle had too much necrotic debris today. I used a #5 curette we are able to get most of the soft. I continued with the silver nitrate to the much smaller wound on the right medial ankle she had a new area on her right lower pretibial area which she says was due to a role in her compression 03/05/2018; both wound areas look healthy. Not much change in dimensions from last week. I continue to use silver nitrate and Hydrofera Blue. The patient saw Dr. Doren Custard of vein and vascular. He felt she had venous stasis ulcers. He felt based on her previous arteriogram she should have  adequate circulation for healing. Also she has deep venous reflux but really no significant correctable superficial venous reflux at this time. He felt we should continue with conservative management including leg elevation and compression 04/02/2018; since we last saw this woman about a month ago she had a fall apparently suffered a pelvic fracture. I did not look up the x-ray. Nevertheless because of pain she literally was bedbound for 2 weeks and had home health coming out to change the dressing. Somewhat predictably this is resulted in considerable improvement in both wound areas. The right is just about closed on the medial malleolus and the left is about half the size. 04/16/2018; both her wounds continue to go down in size. Using Hydrofera Blue. 05/07/18; both her wounds appeared to be improving especially on the right where it is almost closed. We are using Hydrofera Blue 05/14/2018; slightly worse this week with larger wounds. Surface on the left medial not quite as good. We have been using Hydrofera Blue 05/21/18; again the wounds are slightly larger. Left medial malleolus slightly larger with eschar around the circumference. We have been using Hydrofera Blue undergoing a wraps for a prolonged period of time. This got a lot better when she was more recumbent due to a fall and a back injury. I change the primary dressing the silver alginate today. She did not tolerate a 4 layer compression previously although I may need to bring this up with her next time 05/28/2018; area on the left medial malleolus again is slightly larger with more drainage. Area on the right is roughly unchanged. She has a small area of folliculitis on the right medial just on the lower calf. This does not look ominous. 06/03/2018 left medial malleolus slightly smaller in a better looking surface. We used silver nitrate on this last time with silver alginate. The area on the right appears slightly smaller 1/10; left medial  malleolus slightly smaller. Small open area on the right. We used silver nitrate and silver alginate as of 2 weeks ago. We continue with the wound and compression. These got a lot better when she was off her feet 1/17; right medial malleolus wound is smaller. The left may be slightly smaller. Both surfaces look somewhat better. 1/24; both wounds are slightly smaller. Using silver alginate under Unna boots 1/31; both wounds appear smaller in fact the area on the right medial is just about closed. Surface eschar. We have been using silver alginate under Unna boots. The patient is less active now spends let much less time on her feet and I think this is contributed to the general improvement in the wound condition 2/7; both wounds appear smaller. I was hopeful the right medial would be closed however there there is still the same small open area. Slight amount of surface eschar on the left the dimensions  are smaller there is eschar but the wound edges appear to be free. We have been using silver alginate under Unna boot's 2/14; both wounds once again measure smaller. Circumferential eschar on the left medial. We have been using silver alginate under Unna boots with gradual improvement 2/21; the area on the right medial malleolus has healed. The area on the left is smaller. We have been using silver alginate and Unna boots. We can discharge wrapping the right leg she has 20/30 stockings at home she will need to protect the scar tissue in this area 2/28; the area on the right medial malleolus remains closed the patient has a compression stocking. The area on the left is smaller. We have been using silver alginate and Unna boots. 3/6 the area on the right medial ankle remains closed. Good edema control noted she is using her own compression stocking. The area on the left medial ankle is smaller. We have been managing this with silver alginate and Unna boots which we will continue today. 3/13; the area on  the right medial ankle remains closed and I'm declaring it healed today. When necessary the left is about the same still a healthy-looking surface but no major change and wound area. No evidence of infection and using silver alginate under unna and generally making considerable improvement 3/27 the area on the right medial ankle remains closed the area on the left is about the same as last week. Certainly not any worse we have been using silver alginate under an Unna boot 4/3; the area on the right medial ankle remains closed per the patient. We did not look at this wound. The wound on the left medial ankle is about the same surface looks healthy we have been using silver alginate under an Unna boot 4/10; area on the right medial ankle remains closed per the patient. We did not look at this wound. The wound on the left medial ankle is slightly larger. The patient complains that the [facility] caused burning pain all week. She also told us that she was a lot more active this week. Changed her back to silver alginate 4/17; right medial ankle still closed per the patient. Left medial ankle is slightly larger. Using silver alginate. She did not tolerate Hydrofera Blue on this area 4/24; right medial ankle remains closed we have not look at this. The left medial ankle continues to get larger today by about a centimeter. We have been using silver alginate under Unna boots. She complains about 4 layer compression as an alternative. She has been up on her feet working on her garden 5/8; right medial ankle remains closed we did not look at this. The left medial ankle has increased in size about 100%. We have been using silver alginate under Unna boots. She noted increased pain this week and was not surprised that the wound is deteriorated 5/15; no major change in SA however much less erythema ( one week of doxy ocellulitis). 5/22-64 year old female returns at 1 week to the clinic for left medial ankle  wound for which we have been using silver alginate under 3 layer compression She was placed on DOXY at last visit - the wound is wider at this visit. She is in 3 layer compression 5/29; change to [facility] last week. I had given her empiric doxycycline 2 weeks ago for a week. She is in 3 layer compression. She complains of a lot of pain and drainage on presentation today. 6/5; using Hydrofera Blue. I  gave her doxycycline recently empirically for erythema and pain around the wound. Believe her cultures showed enterococcus which not would not have been well covered by doxycycline nevertheless the wound looks better and I don't feel specifically that the enterococcus needs to be covered. She has a new what looks like a wrap injury on her lateral left ankle. 6/12; she is using Hydrofera Blue. She has a new area on the left anterior lower tibial area. This was a wrap injury last week. 6/19; the patient is using Hydrofera Blue. She arrived with marked inflammation and erythema around the wound and tenderness. 12/01/18 on evaluation today patient appears to be doing a little bit better based on what I'm hearing from the standpoint of lassos evaluation to this as far as the overall appearance of the wound is concerned. Then sometime substandard she typically sees Dr. Dellia Nims. Nonetheless overall very pleased with the progress that she's made up to this point. No fevers, chills, nausea, or vomiting noted at this time. 7/10; some improvement in the surface area. Aggressively debrided last week apparently. I went ahead with the debridement today although the patient does not tolerate this very well. We have been using Iodoflex. Still a fair amount of drainage 7/17; slightly smaller. Using Iodoflex. 7/24; no change from last week in terms of surface area. We have been using Iodoflex. Surface looks and continues to look somewhat better 7/31; surface area slightly smaller better looking surface. We have been  using Iodoflex. This is under Unna boot compression 8/7-Patient presents at 1 week with Unna boot and Iodoflex, wound appears better 8/14-Patient presents at 1 week with Iodoflex, we use the Unna boot, wound appears to be stable better.Patient is getting Botox treatment for the inversion of the foot for tendon release, Next week 8/21; we are using Iodoflex. Unna boot. The wound is stable in terms of surface area. Under illumination there is some areas of the wound that appear to be either epithelialized or perhaps this is adherent slough at this point I was not really clear. It did not wipe off and I was reluctant to debride this today. 8/28; we are using Iodoflex in an Unna boot. Seems to be making good improvement. 9/4; using Iodoflex and wound is slightly smaller. 9/18; we are using Iodoflex with topical silver nitrate when she is here. The wound continues to be smaller 10/2; patient missed her appointment last week due to GI issues. She left and Iodoflex based dressing on for 2 weeks. Wound is about the same size about the size of a dime on the left medial lower 10/9 we have been using Iodoflex on the medial left ankle wound. She has a new superficial probable wrap injury on the dorsal left ankle 10/16; we have been using Hydrofera Blue since last week. This is on the left medial ankle 10/23; we have been using Hydrofera Blue since 2 weeks ago. This is on the left medial ankle. Dimensions are better 11/6; using Hydrofera Blue. I think the wound is smaller but still not closed. Left medial ankle 11/13; we have been using Hydrofera Blue. Wound is certainly no smaller this week. Also the surface not as good. This is the remanent of a very large area on her left medial ankle. 11/20; using Sorbact since last week. Wound was about the same in terms of size although I was disappointed about the surface debris 12/11; 3-week follow-up. Patient was on vacation. Wound is measuring slightly larger we have  been using Sorbact. 12/18;  wound is about the same size however surface looks better last week after debridement. We have been using Sorbact under compression 1/15 wound is probably twice the size of last time increased in length nonviable surface. We have been using Sorbact. She was running a mild fever and missed her appointment last week 1/22; the wound is come down in size but under illumination still a very adherent debris we have been Hydrofera Blue that I changed her to last week 1/29; dimensions down slightly. We have been using Hydrofera Blue 2/19 dimensions are the same however there is rims of epithelialization under illumination. Therefore more the surface area may be epithelialized 2/26; the patient's wound actually measures smaller. The wound looks healthy. We have been using Hydrofera Blue. I had some thoughts about running Apligraf then I still may do that however this looks so much better this week we will delay that for now 3/5; the wound is small but about the same as last week. We have been using Hydrofera Blue. No debridement is required today. 3/19; the wound is about the size of a dime. Healthy looking wound even under illumination. We have been using Hydrofera Blue. No mechanical debridement is necessary 3/26; not much change from last week although still looks very healthy. We have been using Hydrofera Blue under Unna boots Patient was offered an ankle fusion by podiatry but not until the wound heals with a proceed with this. 4/9; the patient comes in today with her original wound on the medial ankle looking satisfactory however she has some uncontrolled swelling in the middle part of her leg with 2 new open areas superiorly just lateral to the tibia. I think this was probably a wrap issue. She said she felt uncomfortable during the week but did not call in. We have been using Hydrofera Blue 4/16; the wound on the medial ankle is about the same. She has innumerable small  areas superior to this across her mid tibia. I think this is probably folliculitis. She is also been working in the yard doing a lot of sweating 4/30; the patient issue on the upper areas across her mid tibia of all healed. I think this was excessive yard work if I remember. Her wound on the medial ankle is smaller. Some debris on this we have been using Hydrofera Blue under Unna boots 5/7; mid tibia. She has been using Hydrofera Blue under an Unna wrap. She is apparently going for her ankle surgery on June 3 10/28/19-Patient returns to clinic with the ankle wound, we are using Hydrofera Blue under Unna wrap, surgery is scheduled for her left foot for June 3 so she will be back for nurse visit next week Electronic Signature(s) Signed: 10/28/2019 10:51:52 AM By: Tobi Bastos MD, MBA Entered By: Tobi Bastos on 10/28/2019 10:51:52 -------------------------------------------------------------------------------- Physical Exam Details Patient Name: Date of Service: [name], [name] 10/28/2019 10:00 [place] Record Number: [id number] Patient Account Number: [id number] Date of Birth/Sex: Treating RN: 04-30-1956 (64 y.o. [name] Primary Care Provider: [name] Other Clinician: Referring Provider: Treating Provider/Extender: Salem Caster in Treatment: 359 Constitutional alert and oriented x 3. sitting or standing blood pressure is within target range for patient.. supine blood pressure is within target range for patient.. pulse regular and within target range for patient.Marland Kitchen respirations regular, non-labored and within target range for patient.Marland Kitchen temperature within target range for patient.. . . Well- nourished and well-hydrated in no acute distress. Notes Ankle wound on  the medial surface Left-looking good, surrounding skin intact, measures smaller Electronic Signature(s) Signed: 10/28/2019 10:52:30 AM By: Tobi Bastos MD, MBA Entered By:  Tobi Bastos on 10/28/2019 10:52:30 -------------------------------------------------------------------------------- Physician Orders Details Patient Name: Date of Service: [name], [name] 10/28/2019 10:00 [place] Record Number: [id number] Patient Account Number: [id number] Date of Birth/Sex: Treating RN: 1955-08-10 (64 y.o. [name] Primary Care Provider: [name] Other Clinician: Referring Provider: Treating Provider/Extender: Salem Caster in Treatment: [phone] Verbal / Phone Orders: No Diagnosis Coding ICD-10 Coding Code Description [phone] Non-pressure chronic ulcer of left ankle with fat layer exposed L97.321 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 breakdown of skin Follow-up Appointments Nurse Visit: - next week Other: - call us to reschedule when able after your surgery Dressing Change Frequency Wound #3 Left,Medial Malleolus Do not change entire dressing for one week. Skin Barriers/Peri-Wound Care Moisturizing lotion TCA Cream or Ointment - mixed with lotion Wound Cleansing May shower with protection. Primary Wound Dressing Wound #3 Left,Medial Malleolus Hydrofera Blue - classic Secondary Dressing Wound #3 Left,Medial Malleolus Dry Gauze Edema Control Unna Boot to Left Lower Extremity Avoid standing for long periods of time Elevate legs to the level of the heart or above for 30 minutes daily and/or when sitting, a frequency of: - throughout the day Support Garment 20-30 mm/Hg pressure to: - compression stocking right leg daily Electronic Signature(s) Signed: 10/28/2019 4:30:26 PM By: Tobi Bastos MD, MBA Signed: 10/28/2019 4:59:31 PM By: Kela Millin Entered By: Kela Millin on 10/28/2019 10:55:27 -------------------------------------------------------------------------------- Problem List Details Patient Name: Date of Service: [name], [name] 10/28/2019 10:00 A M Medical Record Number:  [id number] Patient Account Number: [id number] Date of Birth/Sex: Treating RN: 1956/03/17 (64 y.o. 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 08/03/2014 08/03/2014 L03.116 Cellulitis of left lower limb 09/07/2014 09/07/2014 I78.676 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 and inflammation 11/16/2014 11/16/2014 Resolved Problems ICD-10 Code Description Active Date Resolved Date L97.312 Non-pressure chronic ulcer of right ankle with fat layer exposed 04/10/2016 04/10/2016 Electronic Signature(s) Signed: 10/28/2019 4:30:26 PM By: Tobi Bastos MD, MBA Signed: 10/28/2019 4:59:31 PM By: Kela Millin Entered By: Kela Millin on 10/28/2019 10:16:53 -------------------------------------------------------------------------------- Progress Note Details Patient Name: Date of Service: [name], [name] 10/28/2019 10:00 A M Medical Record Number: [id number] Patient Account Number: [id number] Date of Birth/Sex: Treating RN: 31-Dec-1955 (64 y.o. [name] Primary Care Provider: [name] Other Clinician: Referring Provider: Treating Provider/Extender: Salem Caster in Treatment: 359 Subjective History of Present Illness (HPI) the remaining wound is over the left medial ankle. Similar wound over the right medial ankle healed largely with use of Apligraf. Most recently we have been using  Hydrofera Blue over this wound with considerable improvement. The patient has been extensively worked up in the past for her venous insufficiency and she is not a candidate for antireflux surgery although I have none of the details available currently. 08/24/14; considerable improvement today. About 50% of this  wound areas now epithelialized. The base of the wound appears to be healthier granulation.as opposed to last week when she had deteriorated a considerable improvement 08/17/14; unfortunately the wound has regressed somewhat. The areas of epithelialization from the superior aspect are not nearly as healthy as they were last week. The patient thinks her Hydrofera Blue slipped. 09/07/14; unfortunately the area has markedly regressed in the 2 weeks since I've seen this. There is an odor surrounding erythema. The healthy granulation tissue that we had at the base of the wound now is a dusky color. The nurse reports green drainage 09/14/14; the area looks somewhat better than last week. There is less erythema and less drainage. The culture I did did not show any growth. Nevertheless I think it is better to continue the Cipro and doxycycline for a further week. The remaining wound area was debridement. 09/21/14. Wound did not require debridement last week. Still less erythema and less drainage. She can complete her antibiotics. The areas of epithelialization in the superior aspect of the wound do not look as healthy as they did some weeks ago 10/05/14 continued improvement in the condition of this wound. There is advancing epithelialization. Less aggressive debridement required 10/19/14 continued improvement in the condition and volume of this wound. Less aggressive debridement to the inferior part of this to remove surface slough and fibrinous eschar 11/02/14 no debridement is required. The surface granulation appears healthy although some of her islands of epithelialization seem to have regressed. No evidence  of infection 11/16/14; lites surface debridement done of surface eschar. The wound does not look to be unhealthy. No evidence of infection. Unfortunately the patient has had podiatry issues in the right foot and for some reason has redeveloped small surface ulcerations in the medial right ankle. Her original presentation involved wounds in this area 11/23/14 no debridement. The area on the right ankle has enlarged. The left ankle wound appears stable in terms of the surface although there is periwound inflammation. There has been regression in the amount of new skin 11/30/14 no debridement. Both wound areas appear healthy. There was no evidence of infection. The the new area on the right medial ankle has enlarged although that both the surfaces appear to be stable. 12/07/14; Debridement of the right medial ankle wound. No no debridement was done on the left. 12/14/14 no major change in and now bilateral medial ankle wounds. Both of these are very painful but the no overt evidence of infection. She has had previous venous ablation 12/21/14; patient states that her right medial ankle wound is considerably more painful last week than usual. Her left is also somewhat painful. She could not tolerate debridement. The right medial ankle wound has fibrinous surface eschar 12/28/14 this is a patient with severe bilateral venous insufficiency ulcers. For a considerable period of time we actually had the one on the right medial ankle healed however this recently opened up again in June. The left medial ankle wound has been a refractory area with some absent flows. We had some success with Hydrofera Blue on this area and it literally closed by 50% however it is recently opened up Foley. Both of these were debridement today of surface eschar. She tolerates this poorly 01/25/15: No change in the status of this. Thick adherent escar. Very poor tolerance of any attempt at debridement. I had healed the right medial  malleolus wound for a considerable amount of time and had the left one down to about 50% of the volume although this  is totally regressed over the last 48 weeks. Further the right leg has reopened. she is trying to make a appointment with pain and vascular, previous ablations with Dr. Aleda Grana. I do not believe there is an arterial insufficiency issue here 02/01/15 the status of the adherent eschar bilaterally is actually improved. No debridement was done. She did not manage to get vascular studies done 02/08/15 continued debridement of the area was done today. The slough is less adherent and comes off with less pressure. There is no surrounding infection peripheral pulses are intact 02/15/15 selective debridement with a disposable curette. Again the slough is less adherent and comes off with less difficulty. No surrounding infection peripheral pulses are intact. 02/22/15 selective debridement of the right medial ankle wound. Slough comes off with less difficulty. No obvious surrounding infection peripheral pulses are intact I did not debridement the one on the left. Both of these are stable to improved 03/01/15 selective debridement of both wound areas using a curette to. Adherent slough cup soft with less difficulty. No obvious surrounding infection. The patient tells me that 2 days ago she noted a rash above the right leg wrap. She did not have this on her lower legs when she change this over she arrives with widespread left greater than right almost folliculitis-looking rash which is extremely pruritic. I don't see anything to culture here. There is no rash on the rest of her body. She feels well systemically. 03/08/15; selective debridement of both wounds using a curette. Base of this does not look unhealthy. She had limegreen drainage coming out of the left leg wound and describes a lot of drainage. The rash on her left leg looks improved to. No cultures were done. 03/22/15; patient was not here  last week. Basal wounds does not look healthy and there is no surrounding erythema. No drainage. There is still a rash on the left leg that almost looks vasculitic however it is clearly limited to the top of where the wrap would be. 04/05/15; on the right required a surgical debridement of surface eschar and necrotic subcutaneous tissue. I did not debridement the area on the left. These continue to be large open wounds that are not changing that much. We were successful at one point in healing the area on the right, and at the same time the area on the left was roughly half the size of current measurements. I think a lot of the deterioration has to do with the prolonged time the patient is on her feet at work 04/19/15 I attempted-like surface debridement bilaterally she does not tolerate this. She tells me that she was in allergic care yesterday with extreme pain over her left lateral malleolus/ankle and was told that she has an "sprain" 05/03/15; large bilateral venous insufficiency wounds over the medial malleolus/medial aspect of her ankles. She complains of copious amounts of drainage and his usual large amounts of pain. There is some increasing erythema around the wound on the right extending into the medial aspect of her foot to. historically she came in with these wounds the right one healed and the left one came down to roughly half its current size however the right one is reopened and the left is expanded. 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 05/10/15 large bilateral venous insufficiency wounds. There is less adherence surface left however the surface culture that I did last week grew pseudomonas therefore bilateral selective debridement  score necessary. There is surrounding erythema. The patient describes severe bilateral drainage and a lot of pain in the left ankle. Apparently her podiatrist was were ready to do a cortisone  shot 05/17/15; the patient complains of pain and again copious amounts of drainage. 05/24/15; we used Iodo flex last week. Patient notes considerable improvement in wound drainage. Only needed to change this once. 05/31/15; we continued Iodoflex; the base of these large wounds bilaterally is not too bad but there is probably likely a significant bioburden here. I would like to debridement just doesn't tolerate it. 06/06/14 I would like to continue the Iodoflex although she still hasn't managed to obtain supplies. She has bilateral medial malleoli or large wounds which are mostly superficial. Both of them are covered circumferentially with some nonviable fibrinous slough although she tolerates debridement very poorly. She apparently has an appointment for an ablation on the right leg by interventional radiology. 06/14/15; the patient arrives with the wounds and static condition. We attempted a debridement although she does not do well with this secondary to pain. I 07/05/15; wounds are not much smaller however there appears to be a cleaner granulating base. The left has tight fibrinous slough greater than the right. Debridement is tolerated poorly due to pain. Iodoflex is done more for these wounds in any of the multitude of different dressings I have tried on the left 1 and then subsequently the right. 07/12/15; no change in the condition of this wound. I am able to do an aggressive debridement on the right but not the left. She simply cannot tolerate it. We have been using Iodoflex which helps somewhat. It is worthwhile remembering that at one point we healed the right medial ankle wound and the left was about 25% of the current circumference. We have suggested returning to vascular surgery for review of possible further ablations for one reason or another she has not been able to do this. 07/26/15 no major change in the condition of either wound on her medial ankle. I did not attempt to debridement of  these. She has been aggressively scrubbing these while she is in the shower at home. She has her supply of Iodoflex which seems to have done more for these wounds then anything I have put on recently. 08/09/15 wound area appears larger although not verified by measurements. Using Iodoflex 09/05/2015 -- she was here for avisit today but had significant problems with the wound and I was asked to see her for a physician opinion. I have summarize that this lady has had surgery on her left lower extremity about 10 years ago where the possible veins stripping was done. She has had an opinion from interventional radiology around November 2016 where no further sclerotherapy was ordered. The patient works 12 hours a day and stands on a concrete floor with work boots and is unable to get the proper compression she requires and cannot elevate her limbs appropriately at any given time. She has recently grown Pseudomonas from her wound culture but has not started her ciprofloxacin which was called in for her. 09/13/15 this continues to be a difficult situation for this patient. At one point I had this wound down to a 1.5 x 1.5" wound on her left leg. This is deteriorated and the right leg has reopened. She now has substantial wounds on her medial calcaneus, malleoli and into her lower leg. One on the left has surface eschar but these are far too painful for me to debridement here. She has  a vascular surgery appointment next week to see if anything can be done to help here. I think she has had previous ablations several years ago at [facility]. She has no major edema. She tells me that she did not get product last time [facility] Ag] and went for several days without it. She continues to work in work boots 12 hours a day. She cannot get compression/4-layer under her work boots. 09/20/15 no major change. Periwound edema control was not very good. Her point with pain and vascular is next Wednesday the 25th 09/28/15; the  patient is seen vascular surgery and is apparently scheduled for repeat duplex ultrasounds of her bilateral lower legs next week. 10/05/15; the patient was seen by Dr. Doren Custard of vascular surgery. He feels that she should have arterial insufficiency excluded as cause/contributed to her nonhealing stage she is therefore booked for an arteriogram. She has apparently monophasic signals in the dorsalis pedis pulses. She also of course has known severe chronic venous insufficiency with previous procedures as noted previously. I had another long discussion with the patient today about her continuing to work 12 hour shifts. I've written her out for 2 months area had concerns about this as her work location is currently undergoing significant turmoil and this may lead to her termination. She is aware of this however I agree with her that she simply cannot continue to stand for 12 hours multiple days a week with the substantial wound areas she has. 10/19/15; the Dr. Doren Custard appointment was largely for an arteriogram which was normal. She does not have an arterial issue. He didn't make a comment about her chronic venous insufficiency for which she has had previous ablations. Presumably it was not felt that anything additional could be done. The patient is now out of work as I prescribed 2 weeks ago. Her wounds look somewhat less aggravated presumably because of this. I felt I would give debridement another try today 10/25/15; no major change in this patient's wounds. We are struggling to get her product that she can afford into her own home through her insurance. 11/01/15; no major change in the patient's wounds. I have been using silver alginate as the most affordable product. I spoke to Dr. Marla Roe last week with her requested take her to the OR for surgical debridement and placement of ACEL. Dr. Marla Roe told me that she would be willing to do this however [facility] will not cover this, fortunately  the patient has [place] healthcare of some variant 11/08/15; no major change in the patient's wounds. She has been completely nonviable surface that this but is in too much pain with any attempted debridement are clinic. I have arranged for her to see Dr. Marla Roe ham of plastic surgery and this appointment is on Monday. I am hopeful that they will take her to the OR for debridement, possible ACEL ultimately possible skin graft 11/22/15 no major change in the patient's wounds over her bilateral medial calcaneus medial malleolus into the lower legs. Surface on these does not look too bad however on the left there is surrounding erythema and tenderness. This may be cellulitis or could him sleepy tinea. 11/29/15; no major changes in the patient's wounds over her bilateral medial malleolus. There is no infection here and I don't think any additional antibiotics are necessary. There is now plan to move forward. She sees Dr. Marla Roe in a week's time for preparation for operative debridement and ACEL placement I believe on 7/12. She then has  a follow-up appointment with Dr. Marla Roe on 7/21 12/28/15; the patient returns today having been taken to the [facility] by Dr. Marla Roe 12/12/15 she underwent debridement, intraoperative cultures [which were negative]. She had placement of a wound VAC. Parent really ACEL was not available to be placed. The wound VAC foam apparently adhered to the wound since then she's been using silver alginate, Xeroform under Ace wraps. She still says there is a lot of drainage and a lot of pain 01/31/16; this is a patient I see monthly. I had referred her to Dr. Marla Roe him of plastic surgery for large wounds on her bilateral medial ankles. She has been to the OR twice once in early July and once in early August. She tells me over the last 3 weeks she has been using the wound VAC with ACEL underneath it. On the right we've simply been using silver alginate. Under Kerlix Coban  wraps. 02/28/16; this is a patient I'm currently seeing monthly. She is gone on to have a skin graft over her large venous insufficiency ulcer on the left medial ankle. This was done by Dr. Marla Roe him. The patient is a bit perturbed about why she didn't have one on her right medial ankle wound. She has been using silver alginate to this. 03/06/16; I received a phone call from her plastic surgery Dr. Marla Roe. She expressed some concern about the viability of the skin graft she did on the left medial ankle wound. Asked me to place Endoform on this. She told me she is not planning to do a subsequent skin graft on the right as the left one did not take very well. I had placed Hydrofera Blue on the right 03/13/16; continue to have a reasonably healthy wound on the right medial ankle. Down to 3 mm in terms of size. There is epithelialization here. The area on the left medial ankle is her skin graft site. I suppose the last week this looks somewhat better. She has an open area inferiorly however in the center there appears to be some viable tissue. There is a lot of surface callus and eschar that will eventually need to come off however none of this looked to be infected. Patient states that the is able to keep the dressing on for several days which is an improvement. 03/20/16 no major change in the circumference of either wound however on the left side the patient was at Dr. Eusebio Friendly office and they did a debridement of left wound. 50% of the wound seems to be epithelialized. I been using Endoform on the left Hydrofera Blue in the right 03/27/16; she arrives today with her wound is not looking as healthy as they did last week. The area on the right clearly has an adherent surface to this a very similar surface on the left. Unfortunately for this patient this is all too familiar problem. Clearly the Endoform is not working and will need to change that today that has some potential to help this  surface. She does not tolerate debridement in this clinic very well. She is changing the dressing wants 04/03/16; patient arrives with the wounds looking somewhat better especially on the right. Dr. Migdalia Dk change the dressing to silver alginate when she saw her on Monday and also sold her some compression socks. The usefulness of the latter is really not clear and woman with severely draining wounds. 04/10/16; the patient is doing a bit of an experiment wearing the compression stockings that Dr. Migdalia Dk provided her to her left  leg and the out of legs based dressings that we provided to the right. 05/01/16; the patient is continuing to wear compression stockings Dr. Migdalia Dk provided her on the left that are apparently silver impregnated. She has been using Iodoflex to the right leg wound. Still a moderate amount of drainage, when she leaves here the wraps only last for 4 days. She has to change the stocking on the left leg every night 05/15/16; she is now using compression stockings bilaterally provided by Dr. Marla Roe. She is wearing a nonadherent layer over the wounds so really I don't think there is anything specific being done to this now. She has some reduction on the left wound. The right is stable. I think all healing here is being done without a specific dressing 06/09/16; patient arrives here today with not much change in the wound certainly in diameter to large circular wounds over the medial aspect of her ankle bilaterally. Under the light of these services are certainly not viable for healing. There is no evidence of surrounding infection. She is wearing compression stockings with some sort of silver impregnation as prescribed by Dr. Marla Roe. She has a follow-up with her tomorrow. 06/30/16; no major change in the size or condition of her wounds. These are still probably covered with a nonviable surface. She is using only her purchase stockings. She did see Dr. Marla Roe who seemed to want  to apply Dakin's solution to this I'm not extreme short what value this would be. I would suggest Iodoflex which she still has at home. 07/28/16; I follow Mrs. Legere episodically along with Dr. Marla Roe. 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 She has been applying a topical collagen ointment to both wounds with Adaptic. I don't think Dr. Marla Roe is planning to take her back to the OR. 08/19/16; I follow Mrs. Jeneen Rinks on a monthly basis along with Dr. Marla Roe of plastic surgery. 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 I been following her for a number of years. At one point I was able to get the right medial malleolus wound to heal and had the left medial malleolus down to about half its current size however and I had to send her to plastic surgery for an operative debridement. Since then things have been stable to slightly improve the area on the right is slightly better one in the left about the same although there is much less adherent surface than I'm used to with this patient. She is using some form of liquid collagen gel that Dr. Marla Roe provided a Kerlix cover with the patient's own pressure stockings. She tells me that she has extreme pain in both ankles and along the lateral aspect of both feet. She has been unable to work for some period of time. She is telling me she is retiring at the beginning of April. She sees Dr. Doran Durand of orthopedics next week 09/22/16; patient has not seen Dr. Marla Roe since the last time she is here. I'm not really sure what she is using to the wounds other than bits and pieces of think she had left over including most recently Hydrofera Blue. She is using juxtalite stockings. She is having difficulty with her husband's recent illness "stroke". She is having to transport him to various doctors appointments. Dr. Marla Roe  left her the option of a repeat debridement with ACEL however she has not been able to get the  time to follow-up on this. She continues to have a fair amount of drainage out of these wounds with certainly precludes leaving dressings on all week 10/13/16; patient has not seen Dr. Marla Roe since she was last in our clinic. I'm not really sure what she is doing with the wounds, we did try to get her [facility] and I think she is actually using this most of the time. Because of drainage she states she has to change this every second day although this is an improvement from what she used to do. She went to see Dr. Doran Durand who did not think she had a muscular issue with regards to her feet, he referred her to a neurologist and I think the appointment is sometime in June. I changed her back to Iodoflex which she has used in the past but not recently. 11/03/16; the patient has been using Iodoflex although she ran out of this. Still claims that there is a lot of drainage although the wound does not look like this. No surrounding erythema. She has not been back to see Dr. Marla Roe 11/24/16; the patient has been using Iodoflex again but she ran out of it 2 or 3 days ago. There is no major change in the condition of either one of these wounds in fact they are larger and covered in a thick adherent surface slough/nonviable tissue especially on the left. She does not tolerate mechanical debridement in our clinic. Going back to see Dr. Marla Roe of plastic surgery for an operative debridement would seem reasonable. 12/15/16; the patient has not been back to see Dr. Marla Roe. She is been dealing with a series of illnesses and her husband which of monopolized her time. She is been using Sorbact which we largely supplied. She states the drainage is bad enough that it maximum she can go 2-3 days without changing the dressing 01/12/2017 -- the patient has not been back for about 4 weeks and has not seen Dr.  Marla Roe not does she have any appointment pending. 01/23/17; patient has not seen Dr. Marla Roe even though I suggested this previously. She is using Santyl that was suggested last week by Dr. Con Memos this Cost her $16 through her insurance which is indeed surprising 02/12/17; continuing Santyl and the patient is changing this daily. A lot of drainage. She has not been back to see plastic surgery she is using an Ace wrap. Our intake nurse suggested wrap around stockings which would make a good reasonable alternative 02/26/17; patient is been using Santyl and changing this daily due to drainage. She has not been to see plastic surgery she uses in April Ace wrap to control the edema. She did obtain extremitease stockings but stated that the edema in her leg was to big for these 03/20/17; patient is using Santyl and Anasept. Surfaces looked better today the area on the right is actually measuring a little smaller. She has states she has a lot of pain in her feet and ankles and is asking for a consult to pain control which I'll try to help her with through our case manager. 04/10/17; the patient arrives with better-looking wound surfaces and is slightly smaller wound on the left she is using a combination of Santyl and Anasept. She has an appointment or at least as started in the pain control center associated with [name] 05/14/17; this is a patient who I followed for a prolonged period of time. 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 At one point  I had this down to a much smaller wound on the left however these reopened and we've never been able to get these to heal. She has been using Santyl and Anasept gel although 2 weeks ago she ran out of the Anasept gel. She has a stable appearance of the wound. She is going to the wound care clinic at [facility]. They wanted do a nerve block/spinal block although she tells me she is reluctant to go forward with  that. 05/21/17; this is a patient I have followed for many years. 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 Chronic pain and deformity in her ankles as well. She is been to see plastic surgery as well as orthopedics. Using PolyMem AG most recently/Kerramax/ABDs and 2 layer compression. She has managed to keep this on and she is coming in for a nurse check to change the dressing on Tuesdays, we see her on Fridays 06/05/17; really quite a good looking surface and the area especially on the right medial has contracted in terms of dimensions. Well granulated healthy-looking tissue on both sides. Even with an open curet there is nothing that even feels abnormal here. This is as good as I've seen this in quite some time. We have been using PolyMem AG and bringing her in for a nurse check 06/12/17; really quite good surface on both of these wounds. The right medial has contracted a bit left is not. We've been using PolyMem and AG and she is coming in for a nurse visit 06/19/17; we have been using PolyMem AG and bringing her in for a nurse check. Dimensions of her wounds are not better but the surfaces looked better bilaterally. She complained of bleeding last night and the left wound and increasing pain bilaterally. She states her wound pain is more neuropathic than just the wounds. There was some suggestion that this was radicular from her pain management doctor in talking to her it is really difficult to sort this out. 06/26/17; using PolyMem and AG and bringing her in for a nurse check as All of this and reasonably stable condition. Certainly not improved. The dimensions on the lateral part of the right leg look better but not really measuring better. The medial aspect on the left is about the same. 07/03/16; we have been using PolyMen AG and bringing her in for a nurse check to change the dressings as the wounds have drainage which precludes once weekly changing. We are using all  secondary absorptive dressings.our intake nurse is brought up the idea of using a wound VAC/snap VAC on the wound to help with the drainage to see if this would result in some contraction. This is not a bad idea. The area on the right medial is actually looking smaller. Both wounds have a reasonable-looking surface. There is no evidence of cellulitis. The edema is well controlled 07/10/17; the patient was denied for a snap VAC by her insurance. The major issue with these wounds continues to be drainage. We are using wicked PolyMem AG and she is coming in for a nurse visit to change this. The wounds are stable to slightly improved. The surface looks vibrant and the area on the right certainly has shrunk in size but very slowly 07/17/17; the patient still has large wounds on her bilateral medial malleoli. Surface of both of these wounds looks better. The dimensions seem to come and go but no consistent improvement. There is no epithelialization. We do not have options for advanced treatment  products due to insurance issues. They did not approve of the wound VAC to help control the drainage. More recently we've been using PolyMem and AG wicked to allow drainage through. We have been bringing her in for a nurse visit to change this. We do not have a lot of options for wound care products and the home again due to insurance issues 07/24/17; the patient's wound actually looks somewhat better today. No drainage measurements are smaller still healthy-looking surface. We used silver collagen under PolyMen started last week. We have been bringing her in for a dressing change 07/31/17; patient's wound surface continued to look better and I think there is visible change in the dimensions of the wound on the right. Rims of epithelialization. We have been using silver collagen under PolyMen and bringing her in for a dressing change. There appears to be less drainage although she is still in need of the dressing  change 08/07/17. Patient's wound surface continues to look better on both sides and the area on the right is definitely smaller. We have been using silver collagen and PolyMen. She feels that the drainage has been it has been better. I asked her about her vascular status. She went to see Dr. Aleda Grana at [facility] and had some form of ablation. I don't have much detail on this. I haven't my notes from 2016 that she was not a candidate for any further ablation but I don't have any more information on this. We had referred her to vein and vascular I don't think she ever went. He does not have a history of PAD although I don't have any information on this either. We don't even have ABIs in our record 08/14/17; we've been using silver collagen and PolyMen cover. And putting the patient and compression. She we are bringing her in as a nurse visit to change this because ofarge amount of drainage. We didn't the ABIs in clinic today since they had been done in many moons 1.2 bilaterally. She has been to see vein and vascular however this was at [facility] and she had ablation although I really don't have any information on this all seemed biking get a report. She is also been operatively debrided by plastic surgery and had a cell placed probably 8-12 months ago. This didn't have a major effect. We've been making some gains with current dressings 08/19/17-She is here in follow-up evaluation for bilateral medial malleoli ulcers. She continues to tolerate debridement very poorly. We will continue with recently changed topical treatment; if no significant improvement may consider switching to Iodosorb/Iodoflex. 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 She has been using silver collagen and PolyMem. I believe she has been used and tried on Iodoflex before. 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 These have been very refractory over time. We have been using silver collagen and PolyMen. She is coming in once a week for a doctor's and once a week for nurse visits. We are actually making some progress 09/18/17; the patient's wounds are smaller especially on the right medial. She arrives today to upset to consider even washing these off with Anasept which I think is been part of the reason this is been closing. We've been using collagen covered in PolyMen otherwise. It is noted  that she has a small area of folliculitis on the right medial calf that. As we are wrapping her legs I'll give her a short course of doxycycline to make sure this doesn't amount to anything. She is a long list of complaints today including imbalance, shortness of breath on exertion, inversion of her left ankle. With regards to the latter complaints she is been to see orthopedics and they offered her a tendon release surgery I believe but wanted her wounds to be closed first. I have recommended she go see her primary physician with regards to everything else. 09/25/17; patient's wounds are about the same size. We have made some progress bilaterally although not in recent weeks. She will not allow me T wash these wounds with Anasept even if she is doing her cell. Wheeze we've been using collagen covered in PolyMen. Last week she had a small area of folliculitis this is now opened into a small wound. She completed 5 days of trimethoprim sulfamethoxazole 10/02/17; unfortunately the area on her left medial ankle is worse with a larger wound area towards the Achilles. The patient complains of a lot of pain. She will not allow debridement although visually I don't think there is anything to debridement in any case. We have been using silver collagen and PolyMen for several months now. Initially we  are making some progress although I'm not really seeing that today. We will move back to [facility]. His admittedly this is a bit of a repeat however I'm hoping that his situation is different now. The patient tells me she had her leg on the left give out on her yesterday this is process some pain. 10/09/17; the patient is seen twice a week largely because of drainage issues coming out of the chronic medial bimalleolar wounds that are chronic. Last week the dimensions of the one on the left looks a little larger I changed her to [facility]. She comes in today with a history of terrible pain in the bilateral wound areas. She will not allow debridement. She will not even allow a tissue culture. There is no surrounding erythema no no evidence of cellulitis. We have been putting her Kerlix Coban man. She will not allow more aggressive compression as there was a suggestion to put her in 3 layer wraps. 10/16/17; large wounds on her bilateral medial malleoli. These are chronic. Not much change from last week. The surface looks have healthy but absolutely no epithelialization. A lot of pain little less so of drainage. She will not allow debridement or even washing these off in the vigorous fashion with Anasept. 10/23/17; large wounds on her bilateral malleoli which are chronic. Some improvement in terms of size perhaps on the right since last time I saw these. She states that after we increased the 3 layer compression there was some bleeding, when she came in for a nurse visit she did not want 3 layer compression put back on about our nurse managed to convince her. She has known chronic venous visit issues and I'm hoping to get her to tolerate the 3 layer compression. using Hydrofera Blue 10/30/17; absolutely no change in the condition of either wound although we've had some improvement in dimensions on the right.. Attempted to put her in 3 layer compression she didn't tolerated she is back in 2 layer  compression. We've been using Hydrofera Blue We looked over her past records. She had venous reflux studies in November 2016. There was no evidence of deep venous reflux on  the right. Superficial vein did not show the greater saphenous vein at think this is been previously ablated the small saphenous vein was within normal limits. The left deep venous system showed no DVT the vessels were positive for deep venous reflux in the posterior tibial veins at the ankle. The greater saphenous vein was surgically absent small saphenous vein was within normal limits. She went to vein and vascular at [facility]. I believe she had an ablation on the left greater saphenous vein. I'll update her reflux studies perhaps ever reviewed by vein and vascular. We've made absolutely no progress in these wounds. Will also try to read and TheraSkins through her insurance 11/06/17; W the patient apparently has a 2 week follow-up with vein and vascular I like him to review the whole issue with regards to her previous vascular workup by Dr. Aleda Grana. We've really made no progress on these wounds in many months. She arrives today with less viable looking surface on the left medial ankle wound. This was apparently looking about the same on Tuesday when she was here for nurse visit. 11/13/17; deep tissue culture I did last time of the left lower leg showed multiple organisms without any predominating. In particular no Staphylococcus or group A strep were isolated. We sent her for venous reflux studies. She's had a previous left greater saphenous vein stripping and I think sclerotherapy of the right greater saphenous vein. She didn't really look at the lesser saphenous vein this both wounds are on the medial aspect. She has reflux in the common femoral vein and popliteal vein and an accessory vein on the right and the common femoral vein and popliteal vein on the left. I'm going to have her go to see vein and vascular just the  look over things and see if anything else beside aggressive compression is indicated here. We have not been able to make any progress on these wounds in spite of the fact that the surface of the wounds is never look too bad. 11/20/17; no major change in the condition of the wounds. Patient reports a large amount of drainage. She has a lot of complaints of pain although enlisting her today I wonder if some of this at least his neuropathic rather than secondary to her wounds. She has an appointment with vein and vascular on 12/30/17. The refractory nature of these wounds in my mind at least need vein and vascular to look over the wounds the recent reflux studies we did and her history to see if anything further can be done here. I also note her gait is deteriorated quite a bit. 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 She has a bilateral Trendelenburg gait. I wonder if this is neuropathic or perhaps multilevel radicular. 11/27/17; her wounds actually looks slightly better. Healthy-looking granulation tissue a scant amount of epithelialization. [place] [facility] will not pay for [employer]. They will play for tri layer Oasis and Dermagraft. This is not a diabetic ulcer. We'll try for the tri layer Oasis. She still complains of some drainage. She has a vein and vascular appointment on 12/30/17 12/04/17; the wounds visually look quite good. Healthy-looking granulation with some degree of epithelialization. We are still waiting for response to our request for trial to try layer Oasis. Her appointment with vascular to review venous and arterial issues isn't sold the end of July 7/31. Not allow debridement or even vigorous cleansing of the wound surface. 12/18/17; slightly smaller especially on the right. Both wounds  have epithelialization superiorly some hyper granulation. We've been using Hydrofera Blue. We still are looking into triple layer Oasis through her insurance 01/08/18 on evaluation today  patient's wound actually appears to be showing signs of good improvement at this point in time. She has been tolerating the dressing changes without complication. Fortunately there does not appear to be any evidence of infection at this point in time. We have been utilizing silver nitrate which does seem to be of benefit for her which is also good news. Overall I'm very happy with how things seem to be both regards appearance as well as measurement. Patient did see Dr. Bridgett Larsson for evaluation on 12/30/17. In his assessment he felt that stripping would not likely add much more than chronic compression to the patient's healing process. His recommendation was to follow-up in three months with Dr. Doren Custard if she hasn't healed in order to consider referral back to you and see vascular where she previously was in a trial and was able to get her wound to heal. I'll be see what she feels she when you staying compression and he reiterated this as well. 01/13/18 on evaluation today patient appears to actually be doing very well in regard to her bilateral medial malleolus ulcers. She seems to have tolerated the chemical cauterization with silver nitrate last week she did have some pain through that evening but fortunately states that I'll be see since it seems to be doing better she is overall pleased with the progress. 01/21/18; really quite a remarkable improvement since I've last seen these wounds. We started using silver nitrate specially on the islands of hyper granulation which for some reason her around the wound circumference. This is really done quite nicely. Primary dressing Hydrofera Blue under 4 layer compression. She seems to be able to hold out without a nurse rewrap. Follow-up in 1 week 01/28/18; we've continued the hydrofera blue but continued with chemical cauterization to the wound area that we started about a month ago for irregular hyper granulation. She is made almost stunning improvement in the  overall wound dimensions. I was not really expecting this degree of improvement in these chronic wounds 02/05/18; we continue with Hydrofera Bluebut of also continued the aggressive chemical cauterization with silver nitrate. We made nice progress with the right greater than left wound. 02/12/18. We continued with Hydrofera Blue after aggressive chemical cauterization with silver nitrate. We appear to be making nice progress with both wound areas 02/19/2018; we continue with [facility] after washing the wounds vigorously with Anasept spray and chemical cauterization with silver nitrate. We are making excellent progress. The area on the right's just about closed 02/26/2018. The area on the left medial ankle had too much necrotic debris today. I used a #5 curette we are able to get most of the soft. I continued with the silver nitrate to the much smaller wound on the right medial ankle she had a new area on her right lower pretibial area which she says was due to a role in her compression 03/05/2018; both wound areas look healthy. Not much change in dimensions from last week. I continue to use silver nitrate and Hydrofera Blue. The patient saw Dr. Doren Custard of vein and vascular. He felt she had venous stasis ulcers. He felt based on her previous arteriogram she should have adequate circulation for healing. Also she has deep venous reflux but really no significant correctable superficial venous reflux at this time. He felt we should continue with conservative management including leg  elevation and compression 04/02/2018; since we last saw this woman about a month ago she had a fall apparently suffered a pelvic fracture. I did not look up the x-ray. Nevertheless because of pain she literally was bedbound for 2 weeks and had home health coming out to change the dressing. Somewhat predictably this is resulted in considerable improvement in both wound areas. The right is just about closed on the medial malleolus  and the left is about half the size. 04/16/2018; both her wounds continue to go down in size. Using Hydrofera Blue. 05/07/18; both her wounds appeared to be improving especially on the right where it is almost closed. We are using Hydrofera Blue 05/14/2018; slightly worse this week with larger wounds. Surface on the left medial not quite as good. We have been using Hydrofera Blue 05/21/18; again the wounds are slightly larger. Left medial malleolus slightly larger with eschar around the circumference. We have been using Hydrofera Blue undergoing a wraps for a prolonged period of time. This got a lot better when she was more recumbent due to a fall and a back injury. I change the primary dressing the silver alginate today. She did not tolerate a 4 layer compression previously although I may need to bring this up with her next time 05/28/2018; area on the left medial malleolus again is slightly larger with more drainage. Area on the right is roughly unchanged. She has a small area of folliculitis on the right medial just on the lower calf. This does not look ominous. 06/03/2018 left medial malleolus slightly smaller in a better looking surface. We used silver nitrate on this last time with silver alginate. The area on the right appears slightly smaller 1/10; left medial malleolus slightly smaller. Small open area on the right. We used silver nitrate and silver alginate as of 2 weeks ago. We continue with the wound and compression. These got a lot better when she was off her feet 1/17; right medial malleolus wound is smaller. The left may be slightly smaller. Both surfaces look somewhat better. 1/24; both wounds are slightly smaller. Using silver alginate under Unna boots 1/31; both wounds appear smaller in fact the area on the right medial is just about closed. Surface eschar. We have been using silver alginate under Unna boots. The patient is less active now spends let much less time on her feet and I  think this is contributed to the general improvement in the wound condition 2/7; both wounds appear smaller. I was hopeful the right medial would be closed however there there is still the same small open area. Slight amount of surface eschar on the left the dimensions are smaller there is eschar but the wound edges appear to be free. We have been using silver alginate under Unna boot's 2/14; both wounds once again measure smaller. Circumferential eschar on the left medial. We have been using silver alginate under Unna boots with gradual improvement 2/21; the area on the right medial malleolus has healed. The area on the left is smaller. We have been using silver alginate and Unna boots. We can discharge wrapping the right leg she has 20/30 stockings at home she will need to protect the scar tissue in this area 2/28; the area on the right medial malleolus remains closed the patient has a compression stocking. The area on the left is smaller. We have been using silver alginate and Unna boots. 3/6 the area on the right medial ankle remains closed. Good edema control noted she is  using her own compression stocking. The area on the left medial ankle is smaller. We have been managing this with silver alginate and Unna boots which we will continue today. 3/13; the area on the right medial ankle remains closed and I'm declaring it healed today. When necessary the left is about the same still a healthy-looking surface but no major change and wound area. No evidence of infection and using silver alginate under unna and generally making considerable improvement 3/27 the area on the right medial ankle remains closed the area on the left is about the same as last week. Certainly not any worse we have been using silver alginate under an Unna boot 4/3; the area on the right medial ankle remains closed per the patient. We did not look at this wound. The wound on the left medial ankle is about the same surface looks  healthy we have been using silver alginate under an Unna boot 4/10; area on the right medial ankle remains closed per the patient. We did not look at this wound. The wound on the left medial ankle is slightly larger. The patient complains that the [facility] caused burning pain all week. She also told us that she was a lot more active this week. Changed her back to silver alginate 4/17; right medial ankle still closed per the patient. Left medial ankle is slightly larger. Using silver alginate. She did not tolerate Hydrofera Blue on this area 4/24; right medial ankle remains closed we have not look at this. The left medial ankle continues to get larger today by about a centimeter. We have been using silver alginate under Unna boots. She complains about 4 layer compression as an alternative. She has been up on her feet working on her garden 5/8; right medial ankle remains closed we did not look at this. The left medial ankle has increased in size about 100%. We have been using silver alginate under Unna boots. She noted increased pain this week and was not surprised that the wound is deteriorated 5/15; no major change in SA however much less erythema ( one week of doxy ocellulitis). 5/22-64 year old female returns at 1 week to the clinic for left medial ankle wound for which we have been using silver alginate under 3 layer compression She was placed on DOXY at last visit - the wound is wider at this visit. She is in 3 layer compression 5/29; change to [facility] last week. I had given her empiric doxycycline 2 weeks ago for a week. She is in 3 layer compression. She complains of a lot of pain and drainage on presentation today. 6/5; using Hydrofera Blue. I gave her doxycycline recently empirically for erythema and pain around the wound. Believe her cultures showed enterococcus which not would not have been well covered by doxycycline nevertheless the wound looks better and I don't feel  specifically that the enterococcus needs to be covered. She has a new what looks like a wrap injury on her lateral left ankle. 6/12; she is using Hydrofera Blue. She has a new area on the left anterior lower tibial area. This was a wrap injury last week. 6/19; the patient is using Hydrofera Blue. She arrived with marked inflammation and erythema around the wound and tenderness. 12/01/18 on evaluation today patient appears to be doing a little bit better based on what I'm hearing from the standpoint of lassos evaluation to this as far as the overall appearance of the wound is concerned. Then sometime substandard she typically  sees Dr. Dellia Nims. Nonetheless overall very pleased with the progress that she's made up to this point. No fevers, chills, nausea, or vomiting noted at this time. 7/10; some improvement in the surface area. Aggressively debrided last week apparently. I went ahead with the debridement today although the patient does not tolerate this very well. We have been using Iodoflex. Still a fair amount of drainage 7/17; slightly smaller. Using Iodoflex. 7/24; no change from last week in terms of surface area. We have been using Iodoflex. Surface looks and continues to look somewhat better 7/31; surface area slightly smaller better looking surface. We have been using Iodoflex. This is under Unna boot compression 8/7-Patient presents at 1 week with Unna boot and Iodoflex, wound appears better 8/14-Patient presents at 1 week with Iodoflex, we use the Unna boot, wound appears to be stable better.Patient is getting Botox treatment for the inversion of the foot for tendon release, Next week 8/21; we are using Iodoflex. Unna boot. The wound is stable in terms of surface area. Under illumination there is some areas of the wound that appear to be either epithelialized or perhaps this is adherent slough at this point I was not really clear. It did not wipe off and I was reluctant to debride this  today. 8/28; we are using Iodoflex in an Unna boot. Seems to be making good improvement. 9/4; using Iodoflex and wound is slightly smaller. 9/18; we are using Iodoflex with topical silver nitrate when she is here. The wound continues to be smaller 10/2; patient missed her appointment last week due to GI issues. She left and Iodoflex based dressing on for 2 weeks. Wound is about the same size about the size of a dime on the left medial lower 10/9 we have been using Iodoflex on the medial left ankle wound. She has a new superficial probable wrap injury on the dorsal left ankle 10/16; we have been using Hydrofera Blue since last week. This is on the left medial ankle 10/23; we have been using Hydrofera Blue since 2 weeks ago. This is on the left medial ankle. Dimensions are better 11/6; using Hydrofera Blue. I think the wound is smaller but still not closed. Left medial ankle 11/13; we have been using Hydrofera Blue. Wound is certainly no smaller this week. Also the surface not as good. This is the remanent of a very large area on her left medial ankle. 11/20; using Sorbact since last week. Wound was about the same in terms of size although I was disappointed about the surface debris 12/11; 3-week follow-up. Patient was on vacation. Wound is measuring slightly larger we have been using Sorbact. 12/18; wound is about the same size however surface looks better last week after debridement. We have been using Sorbact under compression 1/15 wound is probably twice the size of last time increased in length nonviable surface. We have been using Sorbact. She was running a mild fever and missed her appointment last week 1/22; the wound is come down in size but under illumination still a very adherent debris we have been Hydrofera Blue that I changed her to last week 1/29; dimensions down slightly. We have been using Hydrofera Blue 2/19 dimensions are the same however there is rims of epithelialization under  illumination. Therefore more the surface area may be epithelialized 2/26; the patient's wound actually measures smaller. The wound looks healthy. We have been using Hydrofera Blue. I had some thoughts about running Apligraf then I still may do that however this looks  so much better this week we will delay that for now 3/5; the wound is small but about the same as last week. We have been using Hydrofera Blue. No debridement is required today. 3/19; the wound is about the size of a dime. Healthy looking wound even under illumination. We have been using Hydrofera Blue. No mechanical debridement is necessary 3/26; not much change from last week although still looks very healthy. We have been using Hydrofera Blue under Unna boots Patient was offered an ankle fusion by podiatry but not until the wound heals with a proceed with this. 4/9; the patient comes in today with her original wound on the medial ankle looking satisfactory however she has some uncontrolled swelling in the middle part of her leg with 2 new open areas superiorly just lateral to the tibia. I think this was probably a wrap issue. She said she felt uncomfortable during the week but did not call in. We have been using Hydrofera Blue 4/16; the wound on the medial ankle is about the same. She has innumerable small areas superior to this across her mid tibia. I think this is probably folliculitis. She is also been working in the yard doing a lot of sweating 4/30; the patient issue on the upper areas across her mid tibia of all healed. I think this was excessive yard work if I remember. Her wound on the medial ankle is smaller. Some debris on this we have been using Hydrofera Blue under Unna boots 5/7; mid tibia. She has been using Hydrofera Blue under an Unna wrap. She is apparently going for her ankle surgery on June 3 10/28/19-Patient returns to clinic with the ankle wound, we are using Hydrofera Blue under Unna wrap, surgery is scheduled for  her left foot for June 3 so she will be back for nurse visit next week Objective Constitutional alert and oriented x 3. sitting or standing blood pressure is within target range for patient.. supine blood pressure is within target range for patient.. pulse regular and within target range for patient.Marland Kitchen respirations regular, non-labored and within target range for patient.Marland Kitchen temperature within target range for patient.. Well- nourished and well-hydrated in no acute distress. Vitals Time Taken: 10:18 AM, Height: 68 in, Weight: 127 lbs, BMI: 19.3, Temperature: 98.2 F, Pulse: 63 bpm, Respiratory Rate: 18 breaths/min, Blood Pressure: 115/46 mmHg. General Notes: Ankle wound on the medial surface Left-looking good, surrounding skin intact, measures smaller Integumentary (Hair, Skin) Wound #3 status is Open. Original cause of wound was Gradually Appeared. The wound is located on the Left,Medial Malleolus. The wound measures 0.9cm length x 0.5cm width x 0.2cm depth; 0.353cm^2 area and 0.071cm^3 volume. There is Fat Layer (Subcutaneous Tissue) Exposed exposed. There is no tunneling or undermining noted. There is a medium amount of serosanguineous drainage noted. The wound margin is flat and intact. There is large (67-100%) pink granulation within the wound bed. There is no necrotic tissue within the wound bed. Wound #3 status is Open. Original cause of wound was Gradually Appeared. The wound is located on the Left,Medial Malleolus. The wound measures 0.9cm length x 0.5cm width x 0.2cm depth; 0.353cm^2 area and 0.071cm^3 volume. There is Fat Layer (Subcutaneous Tissue) Exposed exposed. There is no tunneling or undermining noted. There is a medium amount of serosanguineous drainage noted. The wound margin is flat and intact. There is large (67-100%) pink granulation within the wound bed. There is no necrotic tissue within the wound bed. 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 Skin Barriers/Peri-Wound Care: Moisturizing lotion TCA Cream or Ointment - mixed with lotion Wound Cleansing: May shower with protection. Primary Wound Dressing: Wound #3 Left,Medial Malleolus: Hydrofera Blue - classic Secondary Dressing: Wound #3 Left,Medial Malleolus: Dry Gauze Edema Control: Unna Boot to Left Lower Extremity Avoid standing for long periods of time Elevate legs to the level of the heart or above for 30 minutes daily and/or when sitting, a frequency of: - throughout the day Support Garment 20-30 mm/Hg pressure to: - compression stocking right leg daily -Continue Hydrofera Blue with Unna boot -Return to clinic for nurse visit next week -Patient to go ahead with the surgery on June 3 as planned for her left foot Electronic Signature(s) Signed: 10/28/2019 10:53:19 AM By: Tobi Bastos MD, MBA Entered By: Tobi Bastos on 10/28/2019 10:53:18 -------------------------------------------------------------------------------- SuperBill Details Patient Name: Date of Service: [name], [name] 10/28/2019 Medical Record Number: [id number] Patient Account Number: [id number] Date of Birth/Sex: Treating RN: June 29, 1955 (64 y.o. [name] Primary Care Provider: [name] Other Clinician: Referring Provider: Treating Provider/Extender: Salem Caster in Treatment: 359 Diagnosis Coding ICD-10 Codes Code Description [phone] Non-pressure chronic ulcer of left ankle with fat layer exposed L97.321 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 breakdown of skin Facility Procedures CPT4 Code: [id number] Description: 29580 - APPLY Louretta Parma BOOT/PROFO BILATERAL Modifier: Quantity:  1 Physician Procedures : CPT4 Code Description Modifier [id number] - WC PHYS LEVEL 3 - EST PT ICD-10 Diagnosis Description Y58.592 Non-pressure chronic ulcer of left ankle with fat layer exposed Quantity: 1 Electronic Signature(s) Signed: 10/28/2019 4:30:26 PM By: Tobi Bastos MD, MBA Signed: 10/28/2019 4:59:31 PM By: Kela Millin Previous Signature: 10/28/2019 10:53:33 AM Version By: Tobi Bastos MD, MBA Entered By: Kela Millin on 10/28/2019 11:04:19

## 2019-10-28 NOTE — Telephone Encounter (Signed)
[employer] states they sent a narrative for the pt's wheelchair and would like the information put in the clinicals.

## 2019-11-01 ENCOUNTER — Telehealth: Payer: Self-pay | Admitting: Podiatry

## 2019-11-01 NOTE — Telephone Encounter (Signed)
Subjective: I spoke with [name] this evening regarding her upcoming surgery.  After reviewing the patient's chart closer there were a few concerns that I needed to discuss with the patient.  I informed the patient that we would likely need to postpone her surgery.  She is very understanding and wants the best possible outcome.   Brief history: Patient states that she has a longstanding history of venous stasis ulcers to the medial ankle left lower extremity.  Patient is not diabetic.  This ulcer has been present for approximately 10 years now.  She has been treated routinely at the wound care center under the care of Dr. Dellia Nims, consisting of aggressive conservative wound care.  Her last vascular work-up was 03/03/2018.  At that time she was negative for any significant arterial disease and no evidence of DVT or superficial venous thrombosis.  Apparently there was reflux in the deep system involving the common femoral vein and popliteal vein and no identified superficial reflux in the left lower extremity.  The patient explained to me that in 2018 she had a slip and fall injury which affected her left ankle.  Over the past 3 years she says that the ankle has developed a contracture and slowly gotten worse.  Patient is currently unable to stand or ambulate without pain and she is constantly at risk of falls due to the foot contracture and positioning.  Patient feels as though at any minute her ankle may break due to the outside pressure she is putting on the foot.  To address this she has had Botox injections at [facility] pain management 3 times with minimal improvement.  She is also tried bracing of different types that have been unsuccessful.  She has also consulted with 2 different orthopedic surgeons who she states, "blew her off" and did not address her concerns seriously.  Patient states that she understands she is high risk due to the venous stasis, however she is unable to go on living like this and  constant fear of rolling her foot or possibly breaking her ankle.  She is also in a constant state of imbalance and instability due to the foot deformity and concern for at risk of falls.  Regarding an open active opioid pain management contract, she has not been on any longstanding opioid pain management.  She has only been treated with the Botox injections which provided minimal relief and improvement.  Patient states she did receive one prescription of tramadol 50 mg pills by the wound care center at one point.  Other than that she has not been actively managed with opioid pain management and she is not at risk for opioid abuse.  In conclusion of our conversation today, the patient is very aware of the risks and benefits associated with her surgery.  She does understand that she would be at risk of below-knee amputation if she is unable to heal and develops significant complications.  Patient also understands she will be nonambulatory and nonpressure weightbearing on the surgical extremity.  She is fully prepared and has made arrangements for offloading using a knee scooter and wheelchair.  Patient understands all of these risks and post procedure protocols and is anxious and willing to proceed with surgery.  My main concern in regards to this patient is her vascular work-up.  I explained to her that prior to surgery I would like her to be reevaluated and assessed by vascular 2 make sure there is no new findings of arterial disease or venous  thrombosis.  Also we will order blood work, CBC and chemistry panel, to make sure there is no unforeseen complications that would affect healing.  I strongly believe that the patient would benefit from surgery, however I want to optimize her potential for healing postoperatively.  My major concern is the vascular venous stasis.  Patient understands and was very understanding that we need to postpone surgery to have a full vascular work-up preoperatively.  We will  reschedule the surgery as soon as she has been evaluated by vascular.   Edrick Kins, DPM [facility]  Dr. Edrick Kins, DPM    [address], [place] [address]                Office [phone]  Fax [phone]

## 2019-11-02 ENCOUNTER — Telehealth: Payer: Self-pay | Admitting: *Deleted

## 2019-11-02 ENCOUNTER — Encounter (HOSPITAL_BASED_OUTPATIENT_CLINIC_OR_DEPARTMENT_OTHER): Payer: Medicare Other | Admitting: Physician Assistant

## 2019-11-02 NOTE — Telephone Encounter (Signed)
-----   Message from Edrick Kins, DPM sent at 11/02/2019  9:39 AM EDT ----- Regarding: [facility] consult Veva Holes,   This patient needs a vascular work-up/consult referral for preoperative clearance.  [facility] refer to [facility], Dr. Deitra Mayo.   Also go ahead and order arterial Doppler LLE, venous Doppler LLE.   Also, patient needs blood work, Programmer, multimedia.   Thanks, Dr. [name]

## 2019-11-02 NOTE — Telephone Encounter (Signed)
Done. [name]

## 2019-11-02 NOTE — Addendum Note (Signed)
Addended by: Cranford Mon R on: 11/02/2019 10:20 AM   Modules accepted: Orders

## 2019-11-03 ENCOUNTER — Telehealth: Payer: Self-pay | Admitting: *Deleted

## 2019-11-03 DIAGNOSIS — Z01818 Encounter for other preprocedural examination: Secondary | ICD-10-CM

## 2019-11-03 DIAGNOSIS — M25572 Pain in left ankle and joints of left foot: Secondary | ICD-10-CM

## 2019-11-03 DIAGNOSIS — I739 Peripheral vascular disease, unspecified: Secondary | ICD-10-CM

## 2019-11-03 NOTE — Telephone Encounter (Signed)
-----   Message from Edrick Kins, DPM sent at 11/02/2019  9:39 AM EDT ----- Regarding: [facility] consult Veva Holes,   This patient needs a vascular work-up/consult referral for preoperative clearance.  [facility] refer to [facility], Dr. Deitra Mayo.   Also go ahead and order arterial Doppler LLE, venous Doppler LLE.   Also, patient needs blood work, Programmer, multimedia.   Thanks, Dr. [name]

## 2019-11-03 NOTE — Telephone Encounter (Signed)
Faxed required form, clinicals and demographics to [facility].

## 2019-11-04 ENCOUNTER — Encounter (HOSPITAL_BASED_OUTPATIENT_CLINIC_OR_DEPARTMENT_OTHER): Payer: Medicare Other | Attending: Internal Medicine | Admitting: Internal Medicine

## 2019-11-04 ENCOUNTER — Other Ambulatory Visit: Payer: Self-pay

## 2019-11-04 DIAGNOSIS — L97322 Non-pressure chronic ulcer of left ankle with fat layer exposed: Secondary | ICD-10-CM | POA: Diagnosis present

## 2019-11-04 DIAGNOSIS — L97321 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 breakdown of skin: Secondary | ICD-10-CM | POA: Insufficient documentation

## 2019-11-04 NOTE — Progress Notes (Signed)
CALAIS, SVEHLA ([id number]) Visit Report for 11/04/2019 Arrival Information Details Patient Name: Date of Service: [facility], Carlton [name] 11/04/2019 10:15 A M Medical Record Number: [id number] Patient Account Number: [id number] Date of Birth/Sex: Treating RN: [birth year] (64 y.o. [name], [name] Primary Care [name]: [name] Other Clinician: Referring [name]: Treating Brand Siever/Extender: [name] in Treatment: 360 Visit Information History Since Last Visit Added or deleted any medications: No Patient Arrived: [name] Any new allergies or adverse reactions: No Arrival Time: 10:48 Had a fall or experienced change in No Accompanied By: self activities of daily living that may affect Transfer Assistance: None risk of falls: Patient Identification Verified: Yes Signs or symptoms of abuse/neglect since last visito No Secondary Verification Process Completed: Yes Hospitalized since last visit: No Patient Requires Transmission-Based Precautions: No Implantable device outside of the clinic excluding No Patient Has Alerts: No cellular tissue based products placed in the center since last visit: Has Dressing in Place as Prescribed: Yes Has Compression in Place as Prescribed: Yes Pain Present Now: No Electronic Signature(s) Signed: 11/04/2019 4:34:00 PM By: Deon Pilling Entered By: Deon Pilling on 11/04/2019 16:21:46 -------------------------------------------------------------------------------- Compression Therapy Details Patient Name: Date of Service: [name], [name] 11/04/2019 10:15 A M Medical Record Number: [id number] Patient Account Number: [id number] Date of Birth/Sex: Treating RN: 1955-11-09 (64 y.o. [name] Primary Care Caddie Randle: [name] Other Clinician: Referring [name]: Treating [name]/Extender: [name] in Treatment: 360 Compression Therapy Performed for Wound Assessment: Wound #3 Left,Medial  Malleolus Performed By: Clinician Deon Pilling, RN Compression Type: Rolena Infante Pre Treatment ABI: 1.2 Electronic Signature(s) Signed: 11/04/2019 4:34:00 PM By: Deon Pilling Entered By: Deon Pilling on 11/04/2019 16:24:06 -------------------------------------------------------------------------------- Encounter Discharge Information Details Patient Name: Date of Service: [name] [name], [name] 11/04/2019 10:15 A M Medical Record Number: [id number] Patient Account Number: [id number] Date of Birth/Sex: Treating RN: May 06, 1956 (64 y.o. [name] Primary Care Jawann Urbani: [name] Other Clinician: Referring [name]: Treating [name]/Extender: [name] in Treatment: 360 Encounter Discharge Information Items Discharge Condition: Stable Ambulatory Status: Cane Discharge Destination: Home Transportation: Private Auto Accompanied By: self Schedule Follow-up Appointment: Yes Clinical Summary of Care: Electronic Signature(s) Signed: 11/04/2019 4:34:00 PM By: Deon Pilling Entered By: Deon Pilling on 11/04/2019 16:25:14 -------------------------------------------------------------------------------- Patient/Caregiver Education Details Patient Name: Date of Service: [name] 6/4/2021andnbsp10:15 A M Medical Record Number: [id number] Patient Account Number: [id number] Date of Birth/Gender: Treating RN: 14-Nov-1955 (64 y.o. [name] Primary Care Physician: [name] Other Clinician: Referring Physician: Treating Physician/Extender: [name] in Treatment: 360 Education Assessment Education Provided To: Patient Education Topics Provided Wound/Skin Impairment: Handouts: Skin Care Do's and Dont's Methods: Explain/Verbal Responses: Reinforcements needed Electronic Signature(s) Signed: 11/04/2019 4:34:00 PM By: Deon Pilling Entered By: Deon Pilling on 11/04/2019  16:25:02 -------------------------------------------------------------------------------- Wound Assessment Details Patient Name: Date of Service: [name], [name] 11/04/2019 10:15 A M Medical Record Number: [id number] Patient Account Number: [id number] Date of Birth/Sex: Treating RN: 1956-03-04 (64 y.o. [name] Primary Care Abiel Antrim: [name] Other Clinician: Referring [name]: Treating [name]/Extender: [name] in Treatment: 360 Wound Status Wound Number: 3 Primary Venous Leg Ulcer Etiology: Wound Location: Left, Medial Malleolus Wound Open Wounding Event: Gradually Appeared Status: Date Acquired: 11/15/2012 Comorbid Congestive Heart Failure, Peripheral Vascular Disease, Weeks Of Treatment: 360 History: Congestive Heart Failure, End Stage Renal Disease, T obacco Use, Clustered Wound: No Chronic  Obstructive Pulmonary Disease (COPD), Type 1 Diabetes Wound Measurements Length: (cm) 0.9 Width: (cm) 0.5 Depth: (cm) 0.2 Area: (cm) 0.353 Volume: (cm) 0.071 % Reduction in Area: 88.8% % Reduction in Volume: 88.7% Epithelialization: Large (67-100%) Tunneling: No Undermining: No Wound Description Classification: Full Thickness Without Exposed Support Structures Wound Margin: Flat and Intact Exudate Amount: Medium Exudate Type: Serosanguineous Exudate Color: red, brown Foul [name] After Cleansing: No Slough/Fibrino Yes Wound Bed Granulation Amount: Large (67-100%) Exposed Structure Granulation Quality: Pink Fascia Exposed: No Necrotic Amount: Small (1-33%) Fat Layer (Subcutaneous Tissue) Exposed: Yes Necrotic Quality: Adherent Slough Tendon Exposed: No Muscle Exposed: No Joint Exposed: No Bone Exposed: No Treatment Notes Wound #3 (Left, Medial Malleolus) 1. Cleanse With Wound Cleanser Soap and water 2. Periwound Care Moisturizing lotion TCA Cream 3. Primary Dressing Applied Hydrofera Blue 4. Secondary Dressing Dry  Gauze Kerramax/Xtrasorb 6. Support Layer Best boy) Signed: 11/04/2019 4:34:00 PM By: Deon Pilling Entered By: Deon Pilling on 11/04/2019 16:23:31 -------------------------------------------------------------------------------- Vitals Details Patient Name: Date of Service: [name] [name], [name] 11/04/2019 10:15 A M Medical Record Number: [id number] Patient Account Number: [id number] Date of Birth/Sex: Treating RN: 27-Jun-1955 (64 y.o. [name], [name] Primary Care Beena Catano: [name] Other Clinician: Referring Lorita Forinash: Treating Undra Harriman/Extender: [name] in Treatment: 360 Vital Signs Time Taken: 10:48 Temperature (F): 98.3 Height (in): 68 Pulse (bpm): 60 Weight (lbs): 127 Respiratory Rate (breaths/min): 18 Body Mass Index (BMI): 19.3 Blood Pressure (mmHg): 120/51 Reference Range: 80 - 120 mg / dl Electronic Signature(s) Signed: 11/04/2019 4:34:00 PM By: Deon Pilling Entered By: Deon Pilling on 11/04/2019 16:22:04

## 2019-11-07 NOTE — Progress Notes (Signed)
REGLA, FITZGIBBON ([id number]) Visit Report for 11/04/2019 SuperBill Details Patient Name: Date of Service: [facility] [name], [name] 11/04/2019 Medical Record Number: [id number] Patient Account Number: [id number] Date of Birth/Sex: Treating RN: Dec 24, 1955 (64 y.o. [name] Primary Care Provider: [name] Other Clinician: Referring Provider: Treating Provider/Extender: [name] in Treatment: 360 Diagnosis Coding ICD-10 Codes Code Description [phone] Non-pressure chronic ulcer of left ankle with fat layer exposed L97.321 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 breakdown of skin Facility Procedures CPT4 Code Description Modifier Quantity [id number] (Facility Use Only) 29580LT - Dorise Bullion BOOT LT 1 Electronic Signature(s) Signed: 11/04/2019 4:34:00 PM By: Deon Pilling Signed: 11/07/2019 5:22:40 PM By: Linton Ham MD Entered By: Deon Pilling on 11/04/2019 16:25:25

## 2019-11-08 ENCOUNTER — Telehealth: Payer: Self-pay | Admitting: *Deleted

## 2019-11-08 NOTE — Telephone Encounter (Signed)
-----   Message from Edrick Kins, DPM sent at 11/07/2019  1:19 PM EDT ----- Regarding: Arterial and venous dopplers. Vascular referral - clearance Fredia Sorrow,   Could we follow-up with this patient's order for arterial and venous dopplers and also vascular clearance with Dr. Deitra Mayo? Patient was scheduled for surgery last week, however, it was postponed because I want a vascular workup prior to surgery. She is anxious to get the surgery done.   Thanks, Dr. [name]

## 2019-11-08 NOTE — Telephone Encounter (Signed)
I informed pt of the [facility] appts and that she could contact [facility] to check for earlier appt.s

## 2019-11-08 NOTE — Telephone Encounter (Signed)
Thank you [name]! :)

## 2019-11-08 NOTE — Telephone Encounter (Signed)
[facility] - Davy Pique, she states is scheduling from 10/17/2019, and pt was not put in as STAT and [facility] nurse did not evaluate pt as STAT. Davy Pique states she can assist in scheduling testing and consultation now, but all testing can not be performed on the same day, and Dr. Scot Dock is only in office on Wednesday. Sonya scheduled the Venous doppler 12/02/2019 at 8:00am, and ABI with TBI and Arterials doppler on 12/07/2019 at 8:00am and consultation with Dr. Scot Dock 12/07/2019 at 9:20am. Davy Pique stated pt could call periodically to see if she could get earlier appts.

## 2019-11-09 ENCOUNTER — Encounter: Payer: Medicare Other | Admitting: Podiatry

## 2019-11-10 ENCOUNTER — Other Ambulatory Visit: Payer: Self-pay

## 2019-11-10 ENCOUNTER — Encounter (HOSPITAL_BASED_OUTPATIENT_CLINIC_OR_DEPARTMENT_OTHER): Payer: Medicare Other | Admitting: Internal Medicine

## 2019-11-10 DIAGNOSIS — L97322 Non-pressure chronic ulcer of left ankle with fat layer exposed: Secondary | ICD-10-CM | POA: Diagnosis not present

## 2019-11-10 NOTE — Progress Notes (Signed)
NOVEMBER, SYPHER ([id number]) Visit Report for 11/10/2019 Arrival Information Details Patient Name: Date of Service: [name], [name] 11/10/2019 10:00 A M Medical Record Number: [id number] Patient Account Number: [id number] Date of Birth/Sex: Treating RN: [birth year] (64 y.o. [name], [name] Primary Care [name]: [name] Other Clinician: Referring [name]: Treating [name]/Extender: [name] in Treatment: 44 Visit Information History Since Last Visit Added or deleted any medications: No Patient Arrived: [name] Any new allergies or adverse reactions: No Arrival Time: 10:38 Had a fall or experienced change in No Accompanied By: self activities of daily living that may affect Transfer Assistance: None risk of falls: Patient Identification Verified: Yes Signs or symptoms of abuse/neglect since last visito No Secondary Verification Process Completed: Yes Hospitalized since last visit: No Patient Requires Transmission-Based Precautions: No Implantable device outside of the clinic excluding No Patient Has Alerts: No cellular tissue based products placed in the center since last visit: Has Dressing in Place as Prescribed: Yes Has Compression in Place as Prescribed: Yes Pain Present Now: Yes Electronic Signature(s) Signed: 11/10/2019 5:22:56 PM By: Baruch Gouty RN, BSN Entered By: Baruch Gouty on 11/10/2019 10:39:15 -------------------------------------------------------------------------------- Compression Therapy Details Patient Name: Date of Service: [name], [name] 11/10/2019 10:00 A M Medical Record Number: [id number] Patient Account Number: [id number] Date of Birth/Sex: Treating RN: 1955/09/15 (64 y.o. [name] Primary Care [name]: [name] Other Clinician: Referring [name]: Treating [name]/Extender: [name] in Treatment: 361 Compression Therapy Performed for Wound Assessment:  Wound #3 Left,Medial Malleolus Performed By: Clinician Baruch Gouty, RN Compression Type: Rolena Infante Electronic Signature(s) Signed: 11/10/2019 5:22:56 PM By: Baruch Gouty RN, BSN Entered By: Baruch Gouty on 11/10/2019 10:56:14 -------------------------------------------------------------------------------- Encounter Discharge Information Details Patient Name: Date of Service: [name], [name] 11/10/2019 10:00 A M Medical Record Number: [id number] Patient Account Number: [id number] Date of Birth/Sex: Treating RN: 11-25-1955 (64 y.o. [name] Primary Care [name]: [name] Other Clinician: Referring [name]: Treating [name]/Extender: [name] in Treatment: 361 Encounter Discharge Information Items Discharge Condition: Stable Ambulatory Status: Cane Discharge Destination: Home Transportation: Private Auto Accompanied By: self Schedule Follow-up Appointment: Yes Clinical Summary of Care: Patient Declined Electronic Signature(s) Signed: 11/10/2019 5:22:56 PM By: Baruch Gouty RN, BSN Entered By: Baruch Gouty on 11/10/2019 11:00:38 -------------------------------------------------------------------------------- Patient/Caregiver Education Details Patient Name: Date of Service: [name], [name] 6/10/2021andnbsp10:00 A M Medical Record Number: [id number] Patient Account Number: [id number] Date of Birth/Gender: Treating RN: 03/30/1956 (64 y.o. [name] Primary Care Physician: [name] Other Clinician: Referring Physician: Treating Physician/Extender: [name] in Treatment: 361 Education Assessment Education Provided To: Patient Education Topics Provided Venous: Methods: Explain/Verbal Responses: Reinforcements needed, State content correctly Wound/Skin Impairment: Methods: Explain/Verbal Responses: Reinforcements needed, State content correctly Electronic  Signature(s) Signed: 11/10/2019 5:22:56 PM By: Baruch Gouty RN, BSN Entered By: Baruch Gouty on 11/10/2019 11:00:18 -------------------------------------------------------------------------------- Wound Assessment Details Patient Name: Date of Service: [name], [name] 11/10/2019 10:00 A M Medical Record Number: [id number] Patient Account Number: [id number] Date of Birth/Sex: Treating RN: May 09, 1956 (64 y.o. [name] Primary Care [name]: [name] Other Clinician: Referring [name]: Treating [name]/Extender: [name] in Treatment: 361 Wound Status Wound Number: 3 Primary Venous Leg Ulcer Etiology: Wound Location: Left, Medial Malleolus Wound Open Wounding Event: Gradually Appeared Status: Date Acquired: 11/15/2012 Comorbid Congestive Heart Failure, Peripheral Vascular Disease, Weeks Of Treatment: 361 History: Congestive Heart  Failure, End Stage Renal Disease, T obacco Use, Clustered Wound: No Chronic Obstructive Pulmonary Disease (COPD), Type 1 Diabetes Wound Measurements Length: (cm) 0.5 Width: (cm) 0.3 Depth: (cm) 0.1 Area: (cm) 0.118 Volume: (cm) 0.012 % Reduction in Area: 96.2% % Reduction in Volume: 98.1% Epithelialization: Large (67-100%) Tunneling: No Undermining: No Wound Description Classification: Full Thickness Without Exposed Support Structures Wound Margin: Flat and Intact Exudate Amount: Small Exudate Type: Serous Exudate Color: amber Foul [name] After Cleansing: No Slough/Fibrino Yes Wound Bed Granulation Amount: Large (67-100%) Exposed Structure Granulation Quality: Pink Fascia Exposed: No Necrotic Amount: None Present (0%) Fat Layer (Subcutaneous Tissue) Exposed: Yes Tendon Exposed: No Muscle Exposed: No Joint Exposed: No Bone Exposed: No Treatment Notes Wound #3 (Left, Medial Malleolus) 2. Periwound Care Barrier cream TCA Ointment 3. Primary Dressing Applied Hydrofera Blue 4.  Secondary Dressing Dry Gauze Kerramax/Xtrasorb 6. Support Layer Best boy) Signed: 11/10/2019 5:22:56 PM By: Baruch Gouty RN, BSN Entered By: Baruch Gouty on 11/10/2019 10:55:28 -------------------------------------------------------------------------------- Russell Details Patient Name: Date of Service: [name], [name] 11/10/2019 10:00 A M Medical Record Number: [id number] Patient Account Number: [id number] Date of Birth/Sex: Treating RN: [birth year] (64 y.o. [name] Primary Care Manuella Blackson: [name] Other Clinician: Referring Preslee Regas: Treating Davidson Palmieri/Extender: [name] in Treatment: 361 Vital Signs Time Taken: 10:39 Temperature (F): 98.6 Height (in): 68 Pulse (bpm): 52 Source: Stated Respiratory Rate (breaths/min): 18 Weight (lbs): 127 Blood Pressure (mmHg): 127/70 Source: Stated Reference Range: 80 - 120 mg / dl Body Mass Index (BMI): 19.3 Electronic Signature(s) Signed: 11/10/2019 5:22:56 PM By: Baruch Gouty RN, BSN Entered By: Baruch Gouty on 11/10/2019 10:39:45

## 2019-11-14 NOTE — Progress Notes (Signed)
ANIELA, CANIGLIA ([id number]) Visit Report for 11/10/2019 SuperBill Details Patient Name: Date of Service: [facility], [name] 11/10/2019 Medical Record Number: [id number] Patient Account Number: [id number] Date of Birth/Sex: Treating RN: Jan 10, 1956 (64 y.o. [name] Provider: [name] Other Clinician: Referring Provider: Treating Provider/Extender: [name] in Treatment: 361 Diagnosis Coding ICD-10 Codes Code Description [phone] Non-pressure chronic ulcer of left ankle with fat layer exposed L97.321 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 breakdown of skin Facility Procedures CPT4 Code Description Modifier Quantity [id number] (Facility Use Only) 29580LT - Dorise Bullion BOOT LT 1 Electronic Signature(s) Signed: 11/10/2019 5:22:56 PM By: Baruch Gouty RN, BSN Signed: 11/14/2019 8:32:12 AM By: Linton Ham MD Entered By: Baruch Gouty on 11/10/2019 11:00:50

## 2019-11-18 ENCOUNTER — Encounter (HOSPITAL_BASED_OUTPATIENT_CLINIC_OR_DEPARTMENT_OTHER): Payer: Medicare Other | Admitting: Internal Medicine

## 2019-11-21 ENCOUNTER — Encounter: Payer: Medicare Other | Admitting: Physical Medicine & Rehabilitation

## 2019-11-21 ENCOUNTER — Ambulatory Visit (INDEPENDENT_AMBULATORY_CARE_PROVIDER_SITE_OTHER): Payer: Medicare Other

## 2019-11-21 ENCOUNTER — Other Ambulatory Visit: Payer: Self-pay

## 2019-11-21 ENCOUNTER — Ambulatory Visit (INDEPENDENT_AMBULATORY_CARE_PROVIDER_SITE_OTHER): Payer: Medicare Other | Admitting: Podiatry

## 2019-11-21 ENCOUNTER — Encounter: Payer: 59 | Admitting: Podiatry

## 2019-11-21 VITALS — Temp 96.6°F

## 2019-11-21 DIAGNOSIS — W19XXXA Unspecified fall, initial encounter: Secondary | ICD-10-CM

## 2019-11-21 DIAGNOSIS — M21542 Acquired clubfoot, left foot: Secondary | ICD-10-CM | POA: Diagnosis not present

## 2019-11-21 DIAGNOSIS — M25572 Pain in left ankle and joints of left foot: Secondary | ICD-10-CM

## 2019-11-21 NOTE — Progress Notes (Signed)
   HPI: 64 y.o. female presenting today for evaluation of a trip and fall injury that occurred approximately 1 week ago. Patient states that she was getting into bed and she twisted her foot. 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 She sustained an injury and was concerned for possible fracture. She has been applying ice and heat and taking tramadol and Tylenol. She continues to be managed at the [facility] wound care center for the venous ulcer to the medial aspect of the ankle. She presents today with Unna boot compression wrap to the left lower extremity.  Past Medical History:  Diagnosis Date  . Allergy   . Ankle wound 02/2016   bilateral  . Dental bridge present    lower  . Dental crowns present    upper right  . Open wounds involving multiple regions of lower extremity   . Peripheral vascular disease (HCC)    venous stasis ulcers bilateral lower extremities  . PONV (postoperative nausea and vomiting)      Physical Exam: General: The patient is alert and oriented x3 in no acute distress.  Dermatology: Louretta Parma boot compression dressings were removed today. Small superficial venous stasis ulcer noted to the medial ankle left lower extremity.  Vascular: History of chronic venous stasis left lower extremity. There are some ecchymosis with edema noted to the lateral aspect of the foot.  Neurological: Epicritic and protective threshold grossly intact bilaterally.   Musculoskeletal Exam: Equinovarus contracture noted with inversion of the rear foot.  There is also contracture at the ankle joint consistent with an equinus type deformity.  Limited range of motion noted. This is a rigid deformity.  Radiographic exam: Mostly unchanged compared to prior x-rays. No identifiable fracture noted.  Assessment: 1. Equina varus deformity left 2. Acute trip and fall injury left lower extremity  Plan  of Care:  1. Patient evaluated. X-Rays reviewed in detail with the patient.  2. Explained to the patient that there is no identifiable fracture. We may proceed with surgical TTC fusion left lower extremity after vascular clearance. Patient has an appointment on 07/02 and 07/09 for vascular work-up. Once the patient receives vascular clearance we will proceed with surgical intervention. Surgery will consist of tibiotalocalcaneal arthrodesis with plating.  request for Integra lateral TTC fusion plate. *Recommend Integra versus Paragon 28 lateral fusion plate 3.  Continue wound care for the medial ankle.  Regarding ulceration, the wound appears very stable and superficial.  I do not believe this will be detrimental to surgical intervention at this time. 4. Today we will order MRI left ankle to help guide in surgical planning. Also to ensure that there is no osteolytic process or inflammatory process of bone underlying the medial ankle ulceration since the ulcer is chronic. 5. Return to clinic 1 week postop   Edrick Kins, DPM [facility]  Dr. Edrick Kins, DPM    [address], [place] [address]                Office [phone]  Fax [phone]

## 2019-11-22 ENCOUNTER — Telehealth: Payer: Self-pay | Admitting: *Deleted

## 2019-11-22 DIAGNOSIS — M21542 Acquired clubfoot, left foot: Secondary | ICD-10-CM

## 2019-11-22 DIAGNOSIS — I739 Peripheral vascular disease, unspecified: Secondary | ICD-10-CM

## 2019-11-22 DIAGNOSIS — W19XXXA Unspecified fall, initial encounter: Secondary | ICD-10-CM

## 2019-11-22 DIAGNOSIS — Z01818 Encounter for other preprocedural examination: Secondary | ICD-10-CM

## 2019-11-22 DIAGNOSIS — M25572 Pain in left ankle and joints of left foot: Secondary | ICD-10-CM

## 2019-11-22 NOTE — Telephone Encounter (Signed)
Faxed orders, last 3 clinicals, demographics as STAT to be performed prior to 12/02/2019, to [facility] for pre-cert and scheduling.

## 2019-11-22 NOTE — Telephone Encounter (Signed)
-----   Message from Edrick Kins, DPM sent at 11/21/2019  6:36 PM EDT ----- Regarding: MRI left ankle Please order MRI left ankle without contrast. MRI needs to be scheduled/completed prior to July 2nd, so what ever facility can get her in the quickest  Diagnosis: Rigid equinovarus contracture LLE. Chronic venous stasis ulcer left medial ankle.   Thanks, Dr. [name]

## 2019-11-23 ENCOUNTER — Ambulatory Visit
Admission: RE | Admit: 2019-11-23 | Discharge: 2019-11-23 | Disposition: A | Discharge status: Home / Self Care | Payer: Medicare Other | Source: Ambulatory Visit | Attending: Podiatry | Admitting: Podiatry

## 2019-12-02 ENCOUNTER — Ambulatory Visit (HOSPITAL_COMMUNITY)
Admission: RE | Admit: 2019-12-02 | Discharge: 2019-12-02 | Disposition: A | Discharge status: Home / Self Care | Payer: Medicare Other | Source: Ambulatory Visit | Attending: Vascular Surgery | Admitting: Vascular Surgery

## 2019-12-02 ENCOUNTER — Other Ambulatory Visit: Payer: Self-pay

## 2019-12-02 DIAGNOSIS — Z01818 Encounter for other preprocedural examination: Secondary | ICD-10-CM | POA: Insufficient documentation

## 2019-12-02 DIAGNOSIS — M25572 Pain in left ankle and joints of left foot: Secondary | ICD-10-CM | POA: Diagnosis present

## 2019-12-02 DIAGNOSIS — I739 Peripheral vascular disease, unspecified: Secondary | ICD-10-CM | POA: Diagnosis present

## 2019-12-07 ENCOUNTER — Ambulatory Visit (INDEPENDENT_AMBULATORY_CARE_PROVIDER_SITE_OTHER): Payer: Medicare Other | Admitting: Vascular Surgery

## 2019-12-07 ENCOUNTER — Encounter: Payer: 59 | Admitting: Podiatry

## 2019-12-07 ENCOUNTER — Ambulatory Visit (HOSPITAL_COMMUNITY)
Admission: RE | Admit: 2019-12-07 | Discharge: 2019-12-07 | Disposition: A | Discharge status: Home / Self Care | Payer: Medicare Other | Source: Ambulatory Visit | Attending: Vascular Surgery | Admitting: Vascular Surgery

## 2019-12-07 ENCOUNTER — Encounter: Payer: Self-pay | Admitting: Vascular Surgery

## 2019-12-07 ENCOUNTER — Encounter (HOSPITAL_COMMUNITY): Payer: Self-pay

## 2019-12-07 ENCOUNTER — Ambulatory Visit (HOSPITAL_COMMUNITY): Admission: RE | Admit: 2019-12-07 | Payer: Medicare Other | Source: Ambulatory Visit

## 2019-12-07 ENCOUNTER — Other Ambulatory Visit: Payer: Self-pay

## 2019-12-07 VITALS — BP 104/60 | HR 62 | Temp 97.7°F | Resp 20 | Ht 68.0 in | Wt 133.0 lb

## 2019-12-07 DIAGNOSIS — M25572 Pain in left ankle and joints of left foot: Secondary | ICD-10-CM | POA: Insufficient documentation

## 2019-12-07 DIAGNOSIS — I83019 Varicose veins of right lower extremity with ulcer of unspecified site: Secondary | ICD-10-CM | POA: Diagnosis not present

## 2019-12-07 DIAGNOSIS — I739 Peripheral vascular disease, unspecified: Secondary | ICD-10-CM

## 2019-12-07 DIAGNOSIS — Z01818 Encounter for other preprocedural examination: Secondary | ICD-10-CM | POA: Insufficient documentation

## 2019-12-07 DIAGNOSIS — L97919 Non-pressure chronic ulcer of unspecified part of right lower leg with unspecified severity: Secondary | ICD-10-CM | POA: Diagnosis not present

## 2019-12-07 DIAGNOSIS — I83029 Varicose veins of left lower extremity with ulcer of unspecified site: Secondary | ICD-10-CM

## 2019-12-07 DIAGNOSIS — L97929 Non-pressure chronic ulcer of unspecified part of left lower leg with unspecified severity: Secondary | ICD-10-CM

## 2019-12-07 NOTE — Progress Notes (Signed)
REASON FOR VISIT:   Preoperative vascular evaluation prior to surgery on the left ankle  MEDICAL ISSUES:   CHRONIC VENOUS INSUFFICIENCY: This patient does have a history of some deep venous reflux bilaterally but no correctable superficial venous reflux.  From a venous standpoint I think it would be important to elevate her legs postoperatively.  I did reassure her that she has no evidence of arterial insufficiency.  She had a previous arteriogram in 2017 which showed no evidence of peripheral vascular disease.  Her noninvasive studies today show normal ABIs and a toe pressure on the left at 132 mmHg which is excellent and suggest adequate circulation for healing.  She also has palpable pedal pulses.  Thus no further vascular work-up is needed prior to her planned surgery on the left foot.  She is not a smoker.  She is not diabetic.  I will see her back as needed.   HPI:   [name] is a pleasant 64 y.o. 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  She was sent for vascular consultation.  Of note I previously seen her with significant venous ulcers.  Back in 2017 because of extensive venous ulcers I performed an arteriogram which showed no evidence of significant peripheral vascular disease.  She does have some deep venous reflux bilaterally and does wear compression stockings and elevate her legs.  Currently she has had wounds on her medial malleolar I that are healed.  I do not get any history of claudication, rest pain.  Her only real risk factor for peripheral vascular disease is a remote history of tobacco use.  She quit in 2008.  She denies any history of diabetes, hypertension, hypercholesterolemia, or family history of premature cardiovascular disease.  Past Medical History:  Diagnosis Date  . Allergy   . Ankle wound 02/2016   bilateral  . Dental bridge present    lower  . Dental crowns present    upper right  . Open wounds  involving multiple regions of lower extremity   . Peripheral vascular disease (HCC)    venous stasis ulcers bilateral lower extremities  . PONV (postoperative nausea and vomiting)     Family History  Problem Relation Age of Onset  . Hypertension Mother   . Lung cancer Father   . Diabetes Maternal Grandmother   . Diabetes Paternal Grandmother   . Healthy Brother     SOCIAL HISTORY: Social History   Tobacco Use  . Smoking status: Former Smoker    Packs/day: 0.25    Years: 26.00    Pack years: 6.50    Types: Cigarettes    Quit date: 04/25/2007    Years since quitting: 12.6  . Smokeless tobacco: Never Used  Substance Use Topics  . Alcohol use: Yes    Alcohol/week: 0.0 standard drinks    Comment: occasionally    Allergies  Allergen Reactions  . Penicillins Hives and Itching    Did it involve swelling of the face/tongue/throat, SOB, or low BP? No Did it involve sudden or severe rash/hives, skin peeling, or any reaction on the inside of your mouth or nose? Yes Did you need to seek medical attention at a hospital or doctor's office? No When did it last happen?10+ years If all above answers are "NO", may proceed with cephalosporin use.    . Adhesive [Tape] Itching  . Doxycycline Nausea And Vomiting  . Latex Itching    Current Outpatient  Medications  Medication Sig Dispense Refill  . acetaminophen (TYLENOL) 500 MG tablet Take 1,000 mg by mouth daily.     Marland Kitchen ALPRAZolam (XANAX) 0.5 MG tablet for sedation before MRI scan; take 1 tab 1 hour before scan; may repeat 1 tab 15 min before scan 3 tablet 0  . gabapentin (NEURONTIN) 100 MG capsule Take 1 capsule (100 mg total) by mouth at bedtime. 30 capsule 1  . Multiple Vitamins-Calcium (ONE-A-DAY WOMENS PO) Take 1 tablet by mouth daily.     . traMADol (ULTRAM) 50 MG tablet Take 50 mg by mouth daily as needed for moderate pain.     . Wound Dressings (HYDROFERA BLUE FOAM DRESSING) PADS Apply topically every Friday.     No  current facility-administered medications for this visit.    REVIEW OF SYSTEMS:  [X]  denotes positive finding, [ ]  denotes negative finding Cardiac  Comments:  Chest pain or chest pressure:    Shortness of breath upon exertion:    Short of breath when lying flat:    Irregular heart rhythm:        Vascular    Pain in calf, thigh, or hip brought on by ambulation:    Pain in feet at night that wakes you up from your sleep:     Blood clot in your veins:    Leg swelling:         Pulmonary    Oxygen at home:    Productive cough:     Wheezing:         Neurologic    Sudden weakness in arms or legs:     Sudden numbness in arms or legs:     Sudden onset of difficulty speaking or slurred speech:    Temporary loss of vision in one eye:     Problems with dizziness:         Gastrointestinal    Blood in stool:     Vomited blood:         Genitourinary    Burning when urinating:     Blood in urine:        Psychiatric    Major depression:         Hematologic    Bleeding problems:    Problems with blood clotting too easily:        Skin    Rashes or ulcers:        Constitutional    Fever or chills:     PHYSICAL EXAM:   Vitals:   12/07/19 0842  BP: 104/60  Pulse: 62  Resp: 20  Temp: 97.7 F (36.5 C)  SpO2: 95%  Weight: 133 lb (60.3 kg)  Height: 5\' 8"  (1.727 m)    GENERAL: The patient is a well-nourished female, in no acute distress. The vital signs are documented above. CARDIAC: There is a regular rate and rhythm.  VASCULAR: I do not detect carotid bruits. She has palpable femoral, popliteal, and dorsalis pedis pulses bilaterally. 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  She has hyperpigmentation bilaterally.    PULMONARY: There is good air exchange bilaterally without wheezing or rales. ABDOMEN: Soft and non-tender with normal pitched bowel sounds.  MUSCULOSKELETAL: There are no major deformities or cyanosis. NEUROLOGIC: No  focal weakness or paresthesias are detected. SKIN: There are no ulcers or rashes noted. PSYCHIATRIC: The patient has a normal affect.  DATA:    ARTERIAL DOPPLER STUDY: I have independently interpreted her arterial Doppler study today.  On  the right side there is a triphasic dorsalis pedis and posterior tibial signal.  ABI is 100%.  Toe pressure is 84 mmHg.  On the left side there is a triphasic dorsalis pedis and posterior tibial signal.  ABI is 100%.  Toe pressure is 132 mmHg.  Deitra Mayo [facility] [phone]

## 2019-12-15 ENCOUNTER — Other Ambulatory Visit: Payer: Self-pay | Admitting: Podiatry

## 2019-12-15 ENCOUNTER — Encounter: Payer: Self-pay | Admitting: Sports Medicine

## 2019-12-15 DIAGNOSIS — L97322 Non-pressure chronic ulcer of left ankle with fat layer exposed: Secondary | ICD-10-CM

## 2019-12-15 MED ORDER — CEPHALEXIN 500 MG PO CAPS: 500.0000 mg | ORAL_CAPSULE | Freq: Three times a day (TID) | ORAL | 0 refills | Status: DC

## 2019-12-15 MED ORDER — OXYCODONE-ACETAMINOPHEN 10-325 MG PO TABS: 1.0000 | ORAL_TABLET | ORAL | 0 refills | Status: AC | PRN

## 2019-12-15 NOTE — Progress Notes (Signed)
Patient called answering service stating that she had bleed through bandage. I advised rest, ice, elevation and to remove boot to re-in force the dressing and then place it back on. Advised patient if bleeding continues to call back for further instructions.  Dr. Cannon Kettle

## 2019-12-15 NOTE — Progress Notes (Signed)
PRN postop 

## 2019-12-16 ENCOUNTER — Telehealth: Payer: Self-pay | Admitting: Podiatry

## 2019-12-16 NOTE — Telephone Encounter (Signed)
Pt left voicemail stating she had ankle surgery yesterday morning and states she is having some bleeding from the surgery site and would like to speak with the nurse.   Please give patient a call.

## 2019-12-19 NOTE — Telephone Encounter (Signed)
"  The wireless customer you are trying is not available."

## 2019-12-20 ENCOUNTER — Telehealth: Payer: Self-pay | Admitting: Podiatry

## 2019-12-20 ENCOUNTER — Other Ambulatory Visit: Payer: Self-pay | Admitting: Podiatry

## 2019-12-20 DIAGNOSIS — M21542 Acquired clubfoot, left foot: Secondary | ICD-10-CM

## 2019-12-20 NOTE — Telephone Encounter (Signed)
Pt called and would like a call back from nurse

## 2019-12-20 NOTE — Telephone Encounter (Signed)
I spoke with pt and she states it did bleed on and off but finally stop and is resting with the foot elevated and using the ice pack.

## 2019-12-21 ENCOUNTER — Other Ambulatory Visit: Payer: Self-pay

## 2019-12-21 ENCOUNTER — Ambulatory Visit (INDEPENDENT_AMBULATORY_CARE_PROVIDER_SITE_OTHER): Payer: Medicare Other

## 2019-12-21 ENCOUNTER — Ambulatory Visit (INDEPENDENT_AMBULATORY_CARE_PROVIDER_SITE_OTHER): Payer: Medicare Other | Admitting: Podiatry

## 2019-12-21 DIAGNOSIS — Z9889 Other specified postprocedural states: Secondary | ICD-10-CM

## 2019-12-21 DIAGNOSIS — M21542 Acquired clubfoot, left foot: Secondary | ICD-10-CM | POA: Diagnosis not present

## 2019-12-21 DIAGNOSIS — M25572 Pain in left ankle and joints of left foot: Secondary | ICD-10-CM

## 2019-12-21 MED ORDER — SULFAMETHOXAZOLE-TRIMETHOPRIM 800-160 MG PO TABS
1.0000 | ORAL_TABLET | Freq: Two times a day (BID) | ORAL | 0 refills | Status: DC
Start: 2019-12-21 — End: 2023-09-14

## 2019-12-21 MED ORDER — OXYCODONE-ACETAMINOPHEN 10-325 MG PO TABS: 1.0000 | ORAL_TABLET | ORAL | 0 refills | Status: DC | PRN

## 2019-12-21 NOTE — Progress Notes (Signed)
Subjective:  Patient presents today status post tibiotalar calcaneal arthrodesis left lower extremity. DOS: 12/15/2019.  Patient states that she is doing well but she does have significant pain most of the time.  There has been slow drainage to the lower extremity ever since the day of surgery.  She presents today with very wet dressings which have saturated her cam boot.  Drainage is mostly serosanguineous.  No new complaints at this time.  Past Medical History:  Diagnosis Date  . Allergy   . Ankle wound 02/2016   bilateral  . Dental bridge present    lower  . Dental crowns present    upper right  . Open wounds involving multiple regions of lower extremity   . Peripheral vascular disease (HCC)    venous stasis ulcers bilateral lower extremities  . PONV (postoperative nausea and vomiting)       Objective/Physical Exam There is a heavy amount of edema noted throughout the foot and ankle of the surgical extremity.  Maceration noted.  There is malodor noted, questionable whether secondary to postoperative infection or just the chronic drainage that she has been experiencing over the past week, especially in the cam boot.  The cam boot has a malodor. Staples are intact.  There is some superficial skin breakdown noted diffusely throughout the left foot and ankle.  Patient does have history of recurrent chronic venous stasis ulcers. Otherwise the foot is in a rectus position with good alignment of the foot in relationship to the leg.  The foot is in an almost 90 degree angle.  Radiographic Exam:  Unfortunately radiographs were taken of the foot instead of ankle x-rays.  The patient had already left the office by the time I realized it was the wrong x-rays.  Based on the x-rays that were provided today the orthopedic hardware and osteotomies sites appear to be stable with good alignment of the tibiotalar calcaneal surgical site  Assessment: 1. s/p tibiotalar calcaneal arthrodesis left. DOS:  12/15/2019 2.  Postsurgical edema left  3.  Superficial skin breakdown diffusely throughout the foot ankle and leg 4.  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 Patient was evaluated. X-rays reviewed 2.  Dry sterile dressings were applied.  Keep clean dry and intact for 5 days. 3.  Continue strict nonweightbearing using a wheelchair and walker in the cam boot 4.  Patient is currently taking Keflex 500 mg 3 times daily.  She only has a few days left.  Today I am going to add Bactrim DS 2 times daily #20.  This was such a large surgery in the foot and ankle and there is malodor, I like to add Bactrim DS therapy to her antibiotic regimen.  Continue Keflex 500 mg until finished 5.  Continue elevation of the lower extremity 6.  Return to clinic in 5 days for dressing change.  I am out of town next week so we will have her follow-up with Dr. Jacqualyn Posey for a dressing change. at this time we need ankle x-rays, not foot x-rays.   Edrick Kins, DPM [facility]  Dr. Edrick Kins, DPM    [address]                                        [place], [place] [address]  Office [phone]  Fax [phone]

## 2019-12-22 ENCOUNTER — Other Ambulatory Visit: Payer: Self-pay | Admitting: Podiatry

## 2019-12-22 DIAGNOSIS — M21542 Acquired clubfoot, left foot: Secondary | ICD-10-CM

## 2019-12-26 ENCOUNTER — Ambulatory Visit (INDEPENDENT_AMBULATORY_CARE_PROVIDER_SITE_OTHER): Payer: Medicare Other

## 2019-12-26 ENCOUNTER — Ambulatory Visit (INDEPENDENT_AMBULATORY_CARE_PROVIDER_SITE_OTHER): Payer: Medicare Other | Admitting: Podiatry

## 2019-12-26 ENCOUNTER — Other Ambulatory Visit: Payer: Self-pay

## 2019-12-26 DIAGNOSIS — L97329 Non-pressure chronic ulcer of left ankle with unspecified severity: Secondary | ICD-10-CM

## 2019-12-26 DIAGNOSIS — M21542 Acquired clubfoot, left foot: Secondary | ICD-10-CM

## 2019-12-29 ENCOUNTER — Telehealth: Payer: Self-pay | Admitting: *Deleted

## 2019-12-29 ENCOUNTER — Ambulatory Visit (INDEPENDENT_AMBULATORY_CARE_PROVIDER_SITE_OTHER): Payer: Medicare Other | Admitting: Podiatry

## 2019-12-29 ENCOUNTER — Other Ambulatory Visit: Payer: Self-pay

## 2019-12-29 DIAGNOSIS — L97329 Non-pressure chronic ulcer of left ankle with unspecified severity: Secondary | ICD-10-CM

## 2019-12-29 DIAGNOSIS — M21542 Acquired clubfoot, left foot: Secondary | ICD-10-CM

## 2019-12-29 LAB — WOUND CULTURE
MICRO NUMBER:: 10749419
SPECIMEN QUALITY:: ADEQUATE

## 2019-12-29 MED ORDER — CIPROFLOXACIN HCL 500 MG PO TABS: 500.0000 mg | ORAL_TABLET | Freq: Two times a day (BID) | ORAL | 0 refills | Status: DC

## 2019-12-29 NOTE — Telephone Encounter (Signed)
-----   Message from Trula Slade, DPM sent at 12/28/2019  5:34 PM EDT ----- Can we try to set up home health care? This is Dr. [name] patient but I would like to do actisorb to the wounds every other day followed by 4x4, kerlix, ACE.   Also, can we try to have home health draw blood work? She is not able to get to the lab. It would be a CBC, BMP. Thanks.

## 2019-12-29 NOTE — Telephone Encounter (Signed)
Faxed required form, clinicals and demographics to [facility].

## 2019-12-30 NOTE — Progress Notes (Addendum)
Subjective: [name] is a 64 y.o. is seen today in office s/p TTC arthrodesis  preformed on 12/15/2019 with Dr. [name].  Her last appointment was noted she had superficial skin breakdown diffusely throughout the foot and leg she is currently on Bactrim as well as Keflex.  She presents today for the postop visit.  She states she is having pain and she is nauseous today.  Denies any chest pain shortness of breath.   Objective: General: No acute distress, AAOx3  DP/PT pulses palpable 2/4, CRT < 3 sec to all digits.  RIGHT ankle: Incision is well coapted without any evidence of dehiscence with staples intact however on the medial aspect of the ankle there is diffuse skin breakdown and serosanguineous drainage expressed.  There is localized erythema but there is no ascending cellulitis.  Normal medial malleolus fibrotic, small necrotic tissue is present as well as macerated tissue noted. No pain with calf compression, swelling, warmth, erythema.   Assessment and Plan:  Status post right ankle surgery with medial ankle ulcerations/localized cellulitis  -Treatment options discussed including all alternatives, risks, and complications -X-rays obtained reviewed his ankle.  Hardware intact but any loosening or complicating factors. -Diffuse ulcerations are superficial to the medial ankle.  There is drainage which was cultured today.  Today we cleaned the wound with saline and applied some amount of Betadine followed by nonadherent dressing, 4 x 4's, Kerlix, Ace.  I encouraged elevation.  I like to try to get blood work ordered but she not able to do.  I am trying to arrange home health care for her also see if they can get blood work at home.  I will see her back on Thursday for dressing change however if she has any signs or symptoms of infection she is to report to the emergency department.  Her temperature was taken today and she was afebrile and otherwise vital signs stable (although not sure why they  are not in the chart).   No follow-ups on file.  Trula Slade DPM

## 2019-12-30 NOTE — Progress Notes (Signed)
Subjective: [name] is a 64 y.o. is seen today in office s/p TTC arthrodesis  preformed on 12/15/2019 with Dr. [name].  She presents today for dressing change.  Overall she is feeling better and she is still on Bactrim but she is finished the cephalexin.  She has no new concerns today.  Denies any fevers, chills, nausea, vomiting.  No calf pain, chest pain, shortness of breath.  Objective: General: No acute distress, AAOx3  DP/PT pulses palpable 2/4, CRT < 3 sec to all digits.  RIGHT ankle: Staples noted to the lateral aspect of the ankle with some motion across the incision on the distal portion extremity a small amount of breakdown of the incision.  There is no erythema, ascending cellulitis.  Still diffuse ulcerations present to the medial ankle.  They be somewhat better compared to Monday and there is decreased erythema.  There is no warmth.  The erythema is blanchable.  Drainage is also improved today.  There is no purulence or more serosanguineous drainage expressed.  Macerated tissue also improved. No pain with calf compression, swelling, warmth, erythema.   MICRO NUMBER: [id number]   SPECIMEN QUALITY: Adequate   SOURCE: LEFT ANKLE AREA   STATUS: FINAL   GRAM STAIN: No white blood cells seen Few epithelial cells Many Gram negative bacilli   ISOLATE 1: Pseudomonas aeruginosaAbnormal   Comment: Heavy growth of Pseudomonas aeruginosa  Resulting Agency [facility]  Susceptibility   Pseudomonas aeruginosa    AEROBIC CULT, GRAM STAIN NEGATIVE 1    CEFEPIME 2  Sensitive    CEFTAZIDIME 4  Sensitive    CIPROFLOXACIN <=0.25  Sensitive    GENTAMICIN <=1  Sensitive    IMIPENEM 2  Sensitive    LEVOFLOXACIN 0.5  Sensitive    PIP/TAZO 8  Sensitive    TOBRAMYCIN <=1  Sensitive 1        Assessment and Plan:  Status post right ankle surgery with medial ankle ulcerations/localized cellulitis  -Treatment options discussed including all alternatives, risks, and complications -Dressing was  changed today.  Skin was cleaned with saline and the wound was dressed with Betadine, nonadherent dressing, 4 x 4's, Kerlix, Ace. -We will add Cipro.  -Encourage elevation -Awaiting HHC -Monitor for any clinical signs or symptoms of infection and directed to call the office immediately should any occur or go to the ER.  RTC as scheduled Monday with Dr. Debbora Presto DPM

## 2019-12-30 NOTE — Telephone Encounter (Signed)
done

## 2019-12-30 NOTE — Telephone Encounter (Signed)
Faxed LOV 12/29/2019 note with cover sheet stating DR. Evans would sign orders although Dr. Jacqualyn Posey had ordered start of care, to [facility].

## 2020-01-02 ENCOUNTER — Ambulatory Visit (INDEPENDENT_AMBULATORY_CARE_PROVIDER_SITE_OTHER): Payer: Medicare Other | Admitting: Podiatry

## 2020-01-02 ENCOUNTER — Other Ambulatory Visit: Payer: Self-pay

## 2020-01-02 ENCOUNTER — Encounter: Payer: Self-pay | Admitting: Podiatry

## 2020-01-02 DIAGNOSIS — M21542 Acquired clubfoot, left foot: Secondary | ICD-10-CM

## 2020-01-02 DIAGNOSIS — Z9889 Other specified postprocedural states: Secondary | ICD-10-CM

## 2020-01-02 DIAGNOSIS — L97329 Non-pressure chronic ulcer of left ankle with unspecified severity: Secondary | ICD-10-CM

## 2020-01-02 MED ORDER — OXYCODONE-ACETAMINOPHEN 5-325 MG PO TABS: 1.0000 | ORAL_TABLET | Freq: Four times a day (QID) | ORAL | 0 refills | Status: DC | PRN

## 2020-01-02 MED ORDER — CIPROFLOXACIN HCL 500 MG PO TABS: 500.0000 mg | ORAL_TABLET | Freq: Two times a day (BID) | ORAL | 0 refills | Status: DC

## 2020-01-02 NOTE — Progress Notes (Signed)
   Subjective:  Patient presents today status post tibiotalar calcaneal arthrodesis left lower extremity. DOS: 12/15/2019.  Patient has been into the office two times last week for dressing changes.  She is currently taking ciprofloxacin 500 mg two times daily with improvement.  Cultures taken 12/26/2019 were positive for Pseudomonas aeruginosa sensitive to ciprofloxacin.  She has been nonweightbearing in the cam boot as directed.  No new complaints at this time.  Past Medical History:  Diagnosis Date  . Allergy   . Ankle wound 02/2016   bilateral  . Dental bridge present    lower  . Dental crowns present    upper right  . Open wounds involving multiple regions of lower extremity   . Peripheral vascular disease (HCC)    venous stasis ulcers bilateral lower extremities  . PONV (postoperative nausea and vomiting)             Objective/Physical Exam Skin incision to the lateral ankle appears to be healthy and well coapted with staples intact except for the very distal portion of the incision site which does appear to have some necrotic debris and dehiscence.  Medial aspect of the ankle demonstrates skin breakdown with full-thickness ulcers noted.  There is an eschar overlying the distal portion of the medial heel.  No malodor noted.  There is a moderate amount of serous drainage noted.   Patient does have history of recurrent chronic venous stasis ulcers. Otherwise the foot is in a rectus position with good alignment of the foot in relationship to the leg.  The foot is in an almost 90 degree angle.  Assessment: 1. s/p tibiotalar calcaneal arthrodesis left. DOS: 12/15/2019 2.  Postsurgical edema left  3.  Ulcer right medial ankle secondary to venous insufficiency 4.  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 Patient was evaluated.  2.  Dry sterile dressings were applied.  Keep clean dry and intact  3.  Continue strict nonweightbearing using a  wheelchair and walker in the cam boot 4.  Refill prescription for Cipro 500 mg two times daily x10 days  5.  Continue elevation of the lower extremity 6.  Today we will refer the patient back to the [facility] wound care center, Dr. Dellia Nims for management of the recurrent ulceration to the medial aspect of the ankle.  Patient has a very good report and a longstanding history with Dr. Dellia Nims in their evaluation of the medial ankle ulcer would be greatly appreciated 7.  Return to clinic in 1 week for staple removal  Edrick Kins, DPM [facility]  Dr. Edrick Kins, DPM    [address], [place] [address]                Office [phone]  Fax [phone]

## 2020-01-04 ENCOUNTER — Telehealth: Payer: Self-pay | Admitting: *Deleted

## 2020-01-04 NOTE — Telephone Encounter (Signed)
Pt states [facility] is unable to schedule for 2 weeks out, and she needs dressing change orders and an earlier appt.

## 2020-01-04 NOTE — Telephone Encounter (Signed)
[facility] - Brittney states pt was not certain if she wanted [place] until she received clarification from Dr. [name].

## 2020-01-05 ENCOUNTER — Telehealth: Payer: Self-pay | Admitting: *Deleted

## 2020-01-05 NOTE — Telephone Encounter (Signed)
Pt states she is having a dressing issue.

## 2020-01-05 NOTE — Telephone Encounter (Signed)
I spoke with pt and she states she is having some rubbing from the dressing and making a raw place, she had been seen at the [facility] and he told her to get back in with them. Pt states her dressing is getting slimy and she doesn't want it to get infected. I transferred pt to scheduler for an appt tomorrow.

## 2020-01-06 ENCOUNTER — Telehealth: Payer: Self-pay | Admitting: *Deleted

## 2020-01-06 ENCOUNTER — Ambulatory Visit (INDEPENDENT_AMBULATORY_CARE_PROVIDER_SITE_OTHER): Payer: Medicare Other | Admitting: Podiatry

## 2020-01-06 ENCOUNTER — Other Ambulatory Visit: Payer: Self-pay

## 2020-01-06 DIAGNOSIS — Z9889 Other specified postprocedural states: Secondary | ICD-10-CM

## 2020-01-06 DIAGNOSIS — M21542 Acquired clubfoot, left foot: Secondary | ICD-10-CM

## 2020-01-06 DIAGNOSIS — L97329 Non-pressure chronic ulcer of left ankle with unspecified severity: Secondary | ICD-10-CM

## 2020-01-06 NOTE — Telephone Encounter (Signed)
Faxed orders and informed to [facility] to be on the lookout for.

## 2020-01-06 NOTE — Telephone Encounter (Signed)
Yes, please continue [facility]  wound care and lets get her into the office this upcoming Monday please! - Dr. [name]

## 2020-01-06 NOTE — Telephone Encounter (Addendum)
Secure Chat message to Dr. [name] for [facility] wound care instructions. Dr. [name] ordered through [facility], Please order dressing changes 3 times weekly. 1. Cleanse with wound cleanser or normal saline. 2. Apply Aquacel Ag over incision and medial ankle ulcer. Dressed with 4 x 4 gauze, large Curlex, and Ace wrap.

## 2020-01-06 NOTE — Telephone Encounter (Signed)
[facility] - [facility] called to see if Dr. [name] wanted to continue pt's [name] wound care orders after pt's last office visit.

## 2020-01-09 ENCOUNTER — Ambulatory Visit (INDEPENDENT_AMBULATORY_CARE_PROVIDER_SITE_OTHER): Payer: Medicare Other | Admitting: Podiatry

## 2020-01-09 ENCOUNTER — Other Ambulatory Visit: Payer: Self-pay

## 2020-01-09 ENCOUNTER — Ambulatory Visit: Payer: Medicare Other

## 2020-01-09 ENCOUNTER — Encounter: Payer: Self-pay | Admitting: Podiatry

## 2020-01-09 DIAGNOSIS — M21542 Acquired clubfoot, left foot: Secondary | ICD-10-CM

## 2020-01-09 DIAGNOSIS — L97329 Non-pressure chronic ulcer of left ankle with unspecified severity: Secondary | ICD-10-CM

## 2020-01-09 DIAGNOSIS — Z9889 Other specified postprocedural states: Secondary | ICD-10-CM

## 2020-01-09 DIAGNOSIS — L97322 Non-pressure chronic ulcer of left ankle with fat layer exposed: Secondary | ICD-10-CM

## 2020-01-09 NOTE — Progress Notes (Signed)
   Subjective:  Patient presents today status post tibiotalar calcaneal arthrodesis left lower extremity. DOS: 12/15/2019.  Patient has been doing very well pain wise.  She has been nonweightbearing using a wheelchair in the boot as directed.  She is currently still taking the oral antibiotics Cipro 500 mg 2 times daily as prescribed.  She states that she has a few days left.  No new complaints at this time  Past Medical History:  Diagnosis Date  . Allergy   . Ankle wound 02/2016   bilateral  . Dental bridge present    lower  . Dental crowns present    upper right  . Open wounds involving multiple regions of lower extremity   . Peripheral vascular disease (HCC)    venous stasis ulcers bilateral lower extremities  . PONV (postoperative nausea and vomiting)        Objective/Physical Exam Skin incision to the lateral ankle appears to be healthy and well coapted with staples intact except for the very distal portion of the incision site which does appear to have some necrotic debris and dehiscence.  Overall the distal portion of the incision site does appear to be improved since last visit.  Medial aspect of the ankle demonstrates skin breakdown with full-thickness ulcers noted.  There is an eschar overlying the distal portion of the medial heel.  No malodor noted.  There is a moderate amount of serous drainage noted.  There is no periwound erythema or edema which would be concerning for infection or cellulitis Patient does have history of recurrent chronic venous stasis ulcers that dates back several years. Otherwise the foot is in a rectus position with good alignment of the foot in relationship to the leg.  The foot is in an almost 90 degree angle.  Assessment: 1. s/p tibiotalar calcaneal arthrodesis left. DOS: 12/15/2019 2.  Postsurgical edema left  3.  Ulcer right medial ankle secondary to venous insufficiency 4.  History of recurrent ulcers secondary to venous insufficiency  Plan  of Care:  1. Patient was evaluated.  2.  Calcium alginate with silver wound dressing applied with dry sterile dressings.  Keep clean dry and intact until next visit on 01/11/2020. 3.  Continue strict nonweightbearing using a wheelchair and walker in the cam boot 4.  Continue Cipro 500 mg 2 times daily until completed.  5.  Continue elevation of the lower extremity 6.  The patient has an appointment with Dr. Dellia Nims, [facility] wound care center, on 01/17/2020.  In the meantime I will see her Wednesday and Friday for dressing changes.   7.  Return to clinic Wednesday for dressing change.  Friday in our [place] office for dressing change and follow-up x-ray  Edrick Kins, DPM [facility]  Dr. Edrick Kins, DPM    [address], [place] [address]                Office [phone]  Fax [phone]

## 2020-01-09 NOTE — Progress Notes (Signed)
   Subjective:  Patient presents today status post tibiotalar calcaneal arthrodesis left lower extremity. DOS: 12/15/2019.  Patient states she is doing well.  She has been strictly nonweightbearing to the left lower extremity with a wheelchair.  She has been doing dressing changes.  Patient has been taking antibiotics.  She is here for dressing change today.  She is known to Dr. [name].  Past Medical History:  Diagnosis Date  . Allergy   . Ankle wound 02/2016   bilateral  . Dental bridge present    lower  . Dental crowns present    upper right  . Open wounds involving multiple regions of lower extremity   . Peripheral vascular disease (HCC)    venous stasis ulcers bilateral lower extremities  . PONV (postoperative nausea and vomiting)       Objective/Physical Exam Skin incision to the lateral ankle appears to be healthy and well coapted with staples intact except for the very distal portion of the incision site which does appear to have some necrotic debris and dehiscence.  Medial aspect of the ankle demonstrates skin breakdown with full-thickness ulcers noted.  There is an fibrogranular tissue overlying the distal portion of the medial heel.  No malodor noted.  There is a moderate amount of serous drainage noted.   Patient does have history of recurrent chronic venous stasis ulcers. Otherwise the foot is in a rectus position with good alignment of the foot in relationship to the leg.  The foot is in an almost 90 degree angle.  Assessment: 1. s/p tibiotalar calcaneal arthrodesis left. DOS: 12/15/2019 2.  Postsurgical edema left  3.  Ulcer right medial ankle secondary to venous insufficiency 4.  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 Patient was evaluated.  2.  Dry sterile dressings were applied.  Keep clean dry and intact  3.  Continue strict nonweightbearing using a wheelchair and walker in the cam boot 4.  Continue Cipro twice daily times  daily. 5.  Continue elevation of the lower extremity 6.  Patient is scheduled to go to the wound care center at [facility] on 01/17/2020. 7.  Patient would like Dr. [name] to remove the stable.  I will hold off on removing the staples until patient follows up with Dr. [name] next week.

## 2020-01-09 NOTE — Progress Notes (Signed)
dg 

## 2020-01-11 ENCOUNTER — Other Ambulatory Visit: Payer: Self-pay

## 2020-01-11 ENCOUNTER — Ambulatory Visit (INDEPENDENT_AMBULATORY_CARE_PROVIDER_SITE_OTHER): Payer: Medicare Other | Admitting: Podiatry

## 2020-01-11 DIAGNOSIS — Z9889 Other specified postprocedural states: Secondary | ICD-10-CM

## 2020-01-11 DIAGNOSIS — L97329 Non-pressure chronic ulcer of left ankle with unspecified severity: Secondary | ICD-10-CM

## 2020-01-11 DIAGNOSIS — M21542 Acquired clubfoot, left foot: Secondary | ICD-10-CM

## 2020-01-11 MED ORDER — OXYCODONE-ACETAMINOPHEN 5-325 MG PO TABS: 1.0000 | ORAL_TABLET | Freq: Four times a day (QID) | ORAL | 0 refills | Status: DC | PRN

## 2020-01-11 NOTE — Progress Notes (Signed)
   Subjective:  Patient presents today status post tibiotalar calcaneal arthrodesis left lower extremity. DOS: 12/15/2019.  Patient is doing very well and pain is tolerable.  She has been strictly nonweightbearing in the cam boot using a wheelchair as directed.  She is currently still taking the ciprofloxacin 500 mg 2 times daily as prescribed.  She presents today for dressing change.  She does have an appointment on 01/17/2020 with [facility].  The plan is to have dressing changes today and Friday so there is no extended period of time without care she transitions over the wound care center  Past Medical History:  Diagnosis Date  . Allergy   . Ankle wound 02/2016   bilateral  . Dental bridge present    lower  . Dental crowns present    upper right  . Open wounds involving multiple regions of lower extremity   . Peripheral vascular disease (HCC)    venous stasis ulcers bilateral lower extremities  . PONV (postoperative nausea and vomiting)     Objective/Physical Exam Skin incision to the lateral ankle appears to be healthy and well coapted except for the very distal portion of the incision site which does appear to have some necrotic debris and dehiscence.  Overall the distal portion of the incision site does appear to be improved since last visit.  Medial aspect of the ankle demonstrates skin breakdown with full-thickness ulcers noted.  There is an eschar overlying the distal portion of the medial heel.  No malodor noted.  There is a moderate amount of serous drainage noted.  There is no periwound erythema or edema which would be concerning for infection or cellulitis Patient does have history of recurrent chronic venous stasis ulcers that dates back several years. Otherwise the foot is in a rectus position with good alignment of the foot in relationship to the leg.  The foot is in an almost 90 degree angle.  Assessment: 1. s/p tibiotalar calcaneal arthrodesis left. DOS:  12/15/2019 2.  Postsurgical edema left  3.  Ulcer right medial ankle secondary to venous insufficiency 4.  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 Patient was evaluated.  2.  Calcium alginate with silver wound dressing applied with dry sterile dressings.  Keep clean dry and intact until next visit on 01/13/2020 in [place] office. 3.  Continue strict nonweightbearing using a wheelchair and walker in the cam boot 4.  Continue Cipro 500 mg 2 times daily until completed.   5.  Continue elevation of the lower extremity 6.  The patient has an appointment with Dr. Dellia Nims, [facility] wound care center, on 01/17/2020.  In the meantime I will see her today and Friday for dressing changes.   7.  Cultures were taken again today and sent to pathology for culture and sensitivity  8.  Return to clinic Friday in our [place] office for dressing change and follow-up x-ray  Edrick Kins, DPM [facility]  Dr. Edrick Kins, DPM    [address],  [address]                Office [phone]  Fax [phone]

## 2020-01-11 NOTE — Addendum Note (Signed)
Addended by: Cranford Mon R on: 01/11/2020 02:58 PM   Modules accepted: Orders

## 2020-01-12 ENCOUNTER — Telehealth: Payer: Self-pay | Admitting: *Deleted

## 2020-01-12 NOTE — Telephone Encounter (Signed)
[facility] [name] states pt declined their services.

## 2020-01-13 ENCOUNTER — Ambulatory Visit (INDEPENDENT_AMBULATORY_CARE_PROVIDER_SITE_OTHER): Payer: Medicare Other | Admitting: Podiatry

## 2020-01-13 ENCOUNTER — Ambulatory Visit (INDEPENDENT_AMBULATORY_CARE_PROVIDER_SITE_OTHER): Payer: Medicare Other

## 2020-01-13 ENCOUNTER — Encounter: Payer: Self-pay | Admitting: Podiatry

## 2020-01-13 ENCOUNTER — Other Ambulatory Visit: Payer: Self-pay

## 2020-01-13 VITALS — Temp 99.0°F

## 2020-01-13 DIAGNOSIS — M21542 Acquired clubfoot, left foot: Secondary | ICD-10-CM

## 2020-01-13 DIAGNOSIS — Z9889 Other specified postprocedural states: Secondary | ICD-10-CM

## 2020-01-13 DIAGNOSIS — L97322 Non-pressure chronic ulcer of left ankle with fat layer exposed: Secondary | ICD-10-CM | POA: Diagnosis not present

## 2020-01-13 NOTE — Telephone Encounter (Signed)
Yes, she is going to wound care Tuesday, 8/17 with Dr. Dellia Nims and I'm going to let him decide whether or not to have [facility] wound care placed. - Dr. [name]

## 2020-01-13 NOTE — Progress Notes (Signed)
Subjective:  Patient presents today status post tibiotalar calcaneal arthrodesis left lower extremity. DOS: 12/15/2019. Patient was last seen in the [place] office on 01/11/2020 and dressings were changed. No new complaints at this time.   Past Medical History:  Diagnosis Date  . Allergy   . Ankle wound 02/2016   bilateral  . Dental bridge present    lower  . Dental crowns present    upper right  . Open wounds involving multiple regions of lower extremity   . Peripheral vascular disease (HCC)    venous stasis ulcers bilateral lower extremities  . PONV (postoperative nausea and vomiting)        Objective/Physical Exam Skin incision to the lateral ankle appears to be healthy and well coapted except for the very distal portion of the incision site which does appear to have some necrotic debris and dehiscence.  Overall the distal portion of the incision site does appear to be improved since last visit.  Medial aspect of the ankle demonstrates skin breakdown with full-thickness ulcers noted.  There is an eschar overlying the distal portion of the medial heel.  No malodor noted.  There is a moderate amount of serous drainage noted.  There is no periwound erythema or edema which would be concerning for infection or cellulitis Patient does have history of recurrent chronic venous stasis ulcers that dates back several years. Otherwise the foot is in a rectus position with good alignment of the foot in relationship to the leg.  The foot is in an almost 90 degree angle.  Radiographic exam Orthopedic hardware appears to be intact the foot is in a good rectus position.  No change in the hardware position.  Osseous structures of the foot and arthrodesis sites of the ankle and subtalar joint appear to be healing appropriately.  Assessment: 1. s/p tibiotalar calcaneal arthrodesis left. DOS: 12/15/2019 2.  Postsurgical edema left  3.  Ulcer right medial ankle secondary to venous  insufficiency 4.  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 Patient was evaluated.  2.  Hydrofera blue wound dressing applied with dry sterile dressings.  Keep clean dry and intact until next visit on 01/13/2020 in [place] office. 3.  Continue strict nonweightbearing using a wheelchair and walker in the cam boot 4.  Continue Cipro 500 mg 2 times daily until completed.   5.  Continue elevation of the lower extremity 6.  The patient has an appointment with Dr. Dellia Nims, [facility] wound care center, on 01/17/2020.  7.  Cultures taken 01/11/2020 pending.  Prior cultures taken were positive for Pseudomonas aeruginosa and that is when the patient was placed on ciprofloxacin 500 mg 2 times daily 8.  Today I am going to turn over the management of the wounds to the wound care center.  They are well-established with the patient and I believe that would be much more capable of getting the medial ankle ulcers to heal then the means provided here in the office.  Their care and recommendations are greatly appreciated 9.  Return to clinic in 3 weeks  Edrick Kins, DPM [facility]  Dr. Edrick Kins, DPM    [address]                                        [place], [place] [address]  Office [phone]  Fax [phone]

## 2020-01-14 LAB — WOUND CULTURE
MICRO NUMBER:: 10814109
RESULT:: NO GROWTH
SPECIMEN QUALITY:: ADEQUATE

## 2020-01-16 ENCOUNTER — Encounter: Payer: 59 | Admitting: Podiatry

## 2020-01-17 ENCOUNTER — Encounter (HOSPITAL_BASED_OUTPATIENT_CLINIC_OR_DEPARTMENT_OTHER): Payer: Medicare Other | Attending: Internal Medicine | Admitting: Internal Medicine

## 2020-01-17 DIAGNOSIS — L97329 Non-pressure chronic ulcer of left ankle with unspecified severity: Secondary | ICD-10-CM | POA: Diagnosis not present

## 2020-01-17 DIAGNOSIS — L97828 Non-pressure chronic ulcer of other part of left lower leg with other specified severity: Secondary | ICD-10-CM | POA: Insufficient documentation

## 2020-01-17 DIAGNOSIS — L03116 Cellulitis of left lower limb: Secondary | ICD-10-CM | POA: Insufficient documentation

## 2020-01-17 DIAGNOSIS — I87332 Chronic venous hypertension (idiopathic) with ulcer and inflammation of left lower extremity: Secondary | ICD-10-CM | POA: Diagnosis present

## 2020-01-17 NOTE — Progress Notes (Signed)
[name], [name] ([id number]) Visit Report for 01/17/2020 Abuse/Suicide Risk Screen Details Patient Name: Date of Service: [facility] [name], [name] 01/17/2020 9:00 A M Medical Record Number: [id number] Patient Account Number: [id number] Date of Birth/Sex: Treating RN: 01-Jun-1956 (64 y.o. [name] Allayah Raineri: Lennie [name] Other Clinician: Referring Neziah Braley: Treating Javonnie Illescas/Extender: [name] in Treatment: 0 Abuse/Suicide Risk Screen Items Answer ABUSE RISK SCREEN: Has anyone close to you tried to hurt or harm you recentlyo No Do you feel uncomfortable with anyone in your familyo No Has anyone forced you do things that you didnt want to doo No Electronic Signature(s) Signed: 01/17/2020 5:25:51 PM By: Baruch Gouty RN, BSN Entered By: Baruch Gouty on 01/17/2020 [place] -------------------------------------------------------------------------------- Activities of Daily Living Details Patient Name: Date of Service: [name] [name], [name]. 01/17/2020 9:00 A M Medical Record Number: [id number] Patient Account Number: [id number] Date of Birth/Sex: Treating RN: 02/19/1956 (64 y.o. [name] Makarios Madlock: Lennie [name] Other Clinician: Referring Ajahni Nay: Treating Karen Huhta/Extender: [name] in Treatment: 0 Activities of Daily Living Items Answer Activities of Daily Living (Please select one for each item) Drive Automobile Not Able T Medications ake Completely Able Use T elephone Completely Able Care for Appearance Completely Able Use T oilet Completely Able Bath / Shower Completely Able Dress Self Completely Able Feed Self Completely Able Walk Need Assistance Get In / Out Bed Completely Temple for Self Need Assistance Electronic Signature(s) Signed: 01/17/2020 5:25:51 PM By: Baruch Gouty RN,  BSN Entered By: Baruch Gouty on 01/17/2020 09:17:05 -------------------------------------------------------------------------------- Education Screening Details Patient Name: Date of Service: [name], [name]. 01/17/2020 9:00 A M Medical Record Number: [id number] Patient Account Number: [id number] Date of Birth/Sex: Treating RN: 05-30-1956 (65 y.o. [name] Leinaala Catanese: Lennie [name] Other Clinician: Referring [name]: Treating [name]/Extender: [name] in Treatment: 0 Primary Learner Assessed: Patient Learning Preferences/Education Level/Primary Language Learning Preference: Explanation, Demonstration, Printed Material Highest Education Level: High School Preferred Language: English Cognitive Barrier Language Barrier: No Translator Needed: No Memory Deficit: No Emotional Barrier: No Cultural/Religious Beliefs Affecting Medical Care: No Physical Barrier Impaired Vision: Yes Glasses, reading Impaired Hearing: No Decreased Hand dexterity: No Knowledge/Comprehension Knowledge Level: High Comprehension Level: High Ability to understand written instructions: High Ability to understand verbal instructions: High Motivation Anxiety Level: Calm Cooperation: Cooperative Education Importance: Acknowledges Need Interest in Health Problems: Asks Questions Perception: Coherent Willingness to Engage in Self-Management High Activities: Readiness to Engage in Self-Management High Activities: Electronic Signature(s) Signed: 01/17/2020 5:25:51 PM By: Baruch Gouty RN, BSN Entered By: Baruch Gouty on 01/17/2020 09:17:35 -------------------------------------------------------------------------------- Fall Risk Assessment Details Patient Name: Date of Service: [name], [name]. 01/17/2020 9:00 A M Medical Record Number: [id number] Patient Account Number: [id number] Date of Birth/Sex: Treating RN: July 31, 1955 (64 y.o. [name] [name]: Lennie [name] Other Clinician: Referring Adriella Essex: Treating Jakeim Sedore/Extender: [name] in Treatment: 0 Fall Risk Assessment Items Have you had 2 or more falls in the last 12 monthso 0 Yes Have you had any fall that resulted in injury in the last 12 monthso 0 Yes FALLS RISK SCREEN History of falling - immediate or within 3 months 25 Yes Secondary diagnosis (Do you have 2 or more medical diagnoseso) 0 No Ambulatory aid None/bed rest/wheelchair/nurse 0 No Crutches/cane/walker 15 Yes Furniture 0 No Intravenous therapy Access/Saline/Heparin  Lock 0 No Gait/Transferring Normal/ bed rest/ wheelchair 0 No Weak (short steps with or without shuffle, stooped but able to lift head while walking, may seek 10 Yes support from furniture) Impaired (short steps with shuffle, may have difficulty arising from chair, head down, impaired 0 No balance) Mental Status Oriented to own ability 0 Yes Notes nonweight bearing let foot s/p surgery Electronic Signature(s) Signed: 01/17/2020 5:25:51 PM By: Baruch Gouty RN, BSN Entered By: Baruch Gouty on 01/17/2020 09:18:55 -------------------------------------------------------------------------------- Foot Assessment Details Patient Name: Date of Service: [name], [name]. 01/17/2020 9:00 A M Medical Record Number: [id number] Patient Account Number: [id number] Date of Birth/Sex: Treating RN: 1955/09/03 (64 y.o. [name] Kebron Pulse: Lennie [name] Other Clinician: Referring [name]: Treating Jheremy Boger/Extender: [name] in Treatment: 0 Foot Assessment Items Site Locations + = Sensation present, - = Sensation absent, C = Callus, U = Ulcer R = Redness, W = Warmth, M = Maceration, PU = Pre-ulcerative lesion F = Fissure, S = Swelling, D = Dryness Assessment Right: Left: Other Deformity: No No Prior Foot Ulcer: No No Prior  Amputation: No No Charcot Joint: No No Ambulatory Status: Ambulatory With Help Assistance Device: Walker Gait: Steady Electronic Signature(s) Signed: 01/17/2020 5:25:51 PM By: Baruch Gouty RN, BSN Entered By: Baruch Gouty on 01/17/2020 09:30:28 -------------------------------------------------------------------------------- Nutrition Risk Screening Details Patient Name: Date of Service: [name], [name]. 01/17/2020 9:00 A M Medical Record Number: [id number] Patient Account Number: [id number] Date of Birth/Sex: Treating RN: 02-04-1956 (64 y.o. [name] Emelie Newsom: Lennie [name] Other Clinician: Referring Augustus Zurawski: Treating Develle Sievers/Extender: [name] in Treatment: 0 Height (in): 68 Weight (lbs): Body Mass Index (BMI): Nutrition Risk Screening Items Score Screening NUTRITION RISK SCREEN: I have an illness or condition that made me change the kind and/or amount of food I eat 0 No I eat fewer than two meals per day 0 No I eat few fruits and vegetables, or milk products 0 No I have three or more drinks of beer, liquor or wine almost every day 0 No I have tooth or mouth problems that make it hard for me to eat 0 No I don't always have enough money to buy the food I need 0 No I eat alone most of the time 0 No I take three or more different prescribed or over-the-counter drugs a day 0 No Without wanting to, I have lost or gained 10 pounds in the last six months 0 No I am not always physically able to shop, cook and/or feed myself 2 Yes Nutrition Protocols Good Risk Protocol 0 No interventions needed Moderate Risk Protocol High Risk Proctocol Risk Level: Good Risk Score: 2 Electronic Signature(s) Signed: 01/17/2020 5:25:51 PM By: Baruch Gouty RN, BSN Entered By: Baruch Gouty on 01/17/2020 08:02:23

## 2020-01-20 ENCOUNTER — Encounter (HOSPITAL_BASED_OUTPATIENT_CLINIC_OR_DEPARTMENT_OTHER): Payer: Medicare Other | Admitting: Internal Medicine

## 2020-01-20 DIAGNOSIS — I87332 Chronic venous hypertension (idiopathic) with ulcer and inflammation of left lower extremity: Secondary | ICD-10-CM | POA: Diagnosis not present

## 2020-01-20 NOTE — Progress Notes (Signed)
[name], [name] ([id number]) Visit Report for 01/20/2020 Arrival Information Details Patient Name: Date of Service: [facility], [name] 01/20/2020 10:00 A M Medical Record Number: [id number] Patient Account Number: [id number] Date of Birth/Sex: Treating RN: [birth year] (64 y.o. [name] Lavaughn Haberle: [name] Other Clinician: Referring Shemeka Wardle: Treating Jasminemarie Sherrard/Extender: [name] in Treatment: 0 Visit Information History Since Last Visit Added or deleted any medications: No Patient Arrived: Wheel Chair Any new allergies or adverse reactions: No Arrival Time: 10:31 Had a fall or experienced change in No Accompanied By: spouse activities of daily living that may affect Transfer Assistance: None risk of falls: Patient Identification Verified: Yes Signs or symptoms of abuse/neglect since last No Secondary Verification Process Completed: Yes visito Patient Has Alerts: Yes Hospitalized since last visit: No Patient Alerts: L ABI =1.12, TBI = .92 Implantable device outside of the clinic No R ABI= 1.02, TBI= .58 excluding cellular tissue based products placed in the center since last visit: Has Dressing in Place as Prescribed: Yes Has Compression in Place as Prescribed: Yes Has Footwear/Offloading in Place as Yes Prescribed: Left: Removable Cast Walker/Walking Boot Pain Present Now: Yes Electronic Signature(s) Signed: 01/20/2020 5:22:56 PM By: Baruch Gouty RN, BSN Entered By: Baruch Gouty on 01/20/2020 10:34:25 -------------------------------------------------------------------------------- Compression Therapy Details Patient Name: Date of Service: [name], [name] 01/20/2020 10:00 A M Medical Record Number: [id number] Patient Account Number: [id number] Date of Birth/Sex: Treating RN: Aug 05, 1955 (64 y.o. [name] Ranulfo Kall: [name] Other Clinician: Referring Kennetta Pavlovic: Treating  Doyel Mulkern/Extender: [name] in Treatment: 0 Compression Therapy Performed for Wound Assessment: Wound #15 Left,Medial Malleolus Performed By: Clinician Baruch Gouty, RN Compression Type: Rolena Infante Electronic Signature(s) Signed: 01/20/2020 5:22:56 PM By: Baruch Gouty RN, BSN Entered By: Baruch Gouty on 01/20/2020 10:39:45 -------------------------------------------------------------------------------- Compression Therapy Details Patient Name: Date of Service: [name], [name] 01/20/2020 10:00 A M Medical Record Number: [id number] Patient Account Number: [id number] Date of Birth/Sex: Treating RN: 28-May-1956 (64 y.o. [name] Dajane Valli: [name] Other Clinician: Referring Kyiah Canepa: Treating Kennita Pavlovich/Extender: [name] in Treatment: 0 Compression Therapy Performed for Wound Assessment: Wound #14 Left,Lateral Ankle Performed By: Clinician Baruch Gouty, RN Compression Type: Rolena Infante Electronic Signature(s) Signed: 01/20/2020 5:22:56 PM By: Baruch Gouty RN, BSN Entered By: Baruch Gouty on 01/20/2020 10:39:45 -------------------------------------------------------------------------------- Compression Therapy Details Patient Name: Date of Service: [name], [name] 01/20/2020 10:00 A M Medical Record Number: [id number] Patient Account Number: [id number] Date of Birth/Sex: Treating RN: Oct 06, 1955 (64 y.o. [name] Doreen Garretson: [name] Other Clinician: Referring Tashon Capp: Treating Berry Gallacher/Extender: [name] in Treatment: 0 Compression Therapy Performed for Wound Assessment: Wound #16 Left,Medial Lower Leg Performed By: Clinician Baruch Gouty, RN Compression Type: Rolena Infante Electronic Signature(s) Signed: 01/20/2020 5:22:56 PM By: Baruch Gouty RN, BSN Entered By: Baruch Gouty on 01/20/2020  10:39:45 -------------------------------------------------------------------------------- Encounter Discharge Information Details Patient Name: Date of Service: [name], [name] 01/20/2020 10:00 A M Medical Record Number: [id number] Patient Account Number: [id number] Date of Birth/Sex: Treating RN: 1955-06-07 (64 y.o. [name] Garen Woolbright: [name] Other Clinician: Referring Brazos Sandoval: Treating Elleen Coulibaly/Extender: [name] in Treatment: 0 Encounter Discharge Information Items Discharge Condition: Stable Ambulatory Status: Wheelchair Discharge Destination: Home Transportation: Private Auto Accompanied By: spouse Schedule Follow-up Appointment: Yes Clinical Summary of Care: Patient Declined Electronic Signature(s) Signed: 01/20/2020  5:22:56 PM By: Baruch Gouty RN, BSN Entered By: Baruch Gouty on 01/20/2020 10:41:32 -------------------------------------------------------------------------------- Patient/Caregiver Education Details Patient Name: Date of Service: [facility] [name], [name] 8/20/2021andnbsp10:00 A M Medical Record Number: [id number] Patient Account Number: [id number] Date of Birth/Gender: Treating RN: Oct 01, 1955 (64 y.o. [name] Physician: [name] Other Clinician: Referring Physician: Treating Physician/Extender: [name] in Treatment: 0 Education Assessment Education Provided To: Patient Education Topics Provided Venous: Methods: Explain/Verbal Responses: Reinforcements needed, State content correctly Electronic Signature(s) Signed: 01/20/2020 5:22:56 PM By: Baruch Gouty RN, BSN Entered By: Baruch Gouty on 01/20/2020 10:41:16 -------------------------------------------------------------------------------- Wound Assessment Details Patient Name: Date of Service: [name], [name] 01/20/2020 10:00 A M Medical Record Number:  [id number] Patient Account Number: [id number] Date of Birth/Sex: Treating RN: 14-Oct-1955 (64 y.o. [name] Marvon Shillingburg: [name] Other Clinician: Referring Emileigh Kellett: Treating Trevelle Mcgurn/Extender: [name] in Treatment: 0 Wound Status Wound Number: 14 Primary Open Surgical Wound Etiology: Wound Location: Left, Lateral Ankle Wound Status: Open Wounding Event: Surgical Injury Comorbid Peripheral Venous Disease, Osteoarthritis, Confinement Date Acquired: 12/15/2019 History: Anxiety Weeks Of Treatment: 0 Clustered Wound: No Wound Measurements Length: (cm) 1.8 Width: (cm) 0.5 Depth: (cm) 0.5 Area: (cm) 0.707 Volume: (cm) 0.353 % Reduction in Area: 0% % Reduction in Volume: 0% Epithelialization: None Tunneling: No Undermining: No Wound Description Classification: Full Thickness Without Exposed Support Structures Wound Margin: Distinct, outline attached Exudate Amount: Medium Exudate Type: Serosanguineous Exudate Color: red, brown Foul Odor After Cleansing: No Slough/Fibrino Yes Wound Bed Granulation Amount: Small (1-33%) Exposed Structure Granulation Quality: Red Fascia Exposed: No Necrotic Amount: Large (67-100%) Fat Layer (Subcutaneous Tissue) Exposed: Yes Necrotic Quality: Adherent Slough Tendon Exposed: No Muscle Exposed: No Joint Exposed: No Bone Exposed: No Treatment Notes Wound #14 (Left, Lateral Ankle) 2. Periwound Care Barrier cream Moisturizing lotion 3. Primary Dressing Applied Hydrofera Blue 4. Secondary Dressing ABD Pad Dry Gauze 6. Support Layer [employer] 7. 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 01/20/2020 10:00 A M Medical Record Number: [id number] Patient Account Number: [id number] Date of Birth/Sex: Treating RN: January 24, 1956 (64 y.o. [name] Neytiri Asche: [name] Other Clinician: Referring Brytni Dray: Treating Kwanza Cancelliere/Extender: [name] in Treatment: 0 Wound Status Wound Number: 15 Primary Venous Leg Ulcer Etiology: Wound Location: Left, Medial Malleolus Wound Status: Open Wounding Event: Gradually Appeared Comorbid Peripheral Venous Disease, Osteoarthritis, Confinement Date Acquired: 12/30/2019 History: Anxiety Weeks Of Treatment: 0 Clustered Wound: No Wound Measurements Length: (cm) 5.5 Width: (cm) 2.7 Depth: (cm) 0.8 Area: (cm) 11.663 Volume: (cm) 9.331 % Reduction in Area: 0% % Reduction in Volume: 0% Epithelialization: Small (1-33%) Tunneling: No Undermining: No Wound Description Classification: Full Thickness With Exposed Support Structures Wound Margin: Flat and Intact Exudate Amount: Large Exudate Type: Serosanguineous Exudate Color: red, brown Foul Odor After Cleansing: No Slough/Fibrino Yes Wound Bed Granulation Amount: Medium (34-66%) Exposed Structure Granulation Quality: Red Fascia Exposed: No Necrotic Amount: Medium (34-66%) Fat Layer (Subcutaneous Tissue) Exposed: Yes Necrotic Quality: Adherent Slough Tendon Exposed: Yes Muscle Exposed: No Joint Exposed: No Bone Exposed: No Treatment Notes Wound #15 (Left, Medial Malleolus) 2. Periwound Care Barrier cream Moisturizing lotion 3. Primary Dressing Applied Hydrofera Blue 4. Secondary Dressing ABD Pad Dry Gauze 6. Support Layer  [employer] 7. 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 01/20/2020 10:00 A M Medical Record Number: [id number] Patient Account Number: [id number] Date of Birth/Sex: Treating RN: 14-Mar-1956 (64 y.o. [name] Migdalia Olejniczak: [name] Other Clinician: Referring Phil Michels: Treating Iasia Forcier/Extender: [name] in Treatment: 0 Wound Status Wound Number: 16 Primary Venous Leg Ulcer Etiology: Wound Location: Left, Medial Lower Leg Wound Status: Open Wounding Event: Gradually Appeared Comorbid Peripheral Venous Disease, Osteoarthritis, Confinement Date Acquired: 12/30/2019 History: Anxiety Weeks Of Treatment: 0 Clustered Wound: No Wound Measurements Length: (cm) 1.8 Width: (cm) 1.2 Depth: (cm) 0.1 Area: (cm) 1.696 Volume: (cm) 0.17 % Reduction in Area: 0% % Reduction in Volume: 0% Epithelialization: Medium (34-66%) Tunneling: No Undermining: No Wound Description Classification: Full Thickness Without Exposed Support Structures Wound Margin: Flat and Intact Exudate Amount: Medium Exudate Type: Serous Exudate Color: amber Foul Odor After Cleansing: No Slough/Fibrino No Wound Bed Granulation Amount: Large (67-100%) Exposed Structure Granulation Quality: Red Fascia Exposed: No Necrotic Amount: None Present (0%) Fat Layer (Subcutaneous Tissue) Exposed: Yes Tendon Exposed: No Muscle Exposed: No Joint Exposed: No Bone Exposed: No Treatment Notes Wound #16 (Left, Medial Lower Leg) 2. Periwound Care Barrier cream Moisturizing lotion 3. Primary Dressing Applied Hydrofera Blue 4. Secondary Dressing ABD Pad Dry Gauze 6. Support Layer [employer] 7. 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 01/20/2020 10:00 A M Medical Record Number: [id number] Patient Account Number: [id number] Date of Birth/Sex: Treating RN: 1955-06-23 (64 y.o. [name] Annagrace Carr: [name] Other Clinician: Referring Elonda Giuliano: Treating Chrisma Hurlock/Extender: [name] in Treatment: 0 Vital Signs Time Taken: 10:34 Temperature (F): 97.9 Height (in): 68 Pulse (bpm): 80 Source: Stated Respiratory Rate (breaths/min): 18 Blood Pressure (mmHg): 101/69 Reference Range: 80 - 120 mg / dl Electronic Signature(s) Signed: 01/20/2020 5:22:56 PM By: Baruch Gouty RN, BSN Entered By: Baruch Gouty on 01/20/2020 10:35:25

## 2020-01-23 NOTE — Progress Notes (Signed)
[name], [name] ([id number]) Visit Report for 01/20/2020 SuperBill Details Patient Name: Date of Service: [facility], [name] 01/20/2020 Medical Record Number: [id number] Patient Account Number: [id number] Date of Birth/Sex: Treating RN: [birth year] (64 y.o. 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

## 2020-01-24 ENCOUNTER — Encounter (HOSPITAL_BASED_OUTPATIENT_CLINIC_OR_DEPARTMENT_OTHER): Payer: Medicare Other | Admitting: Internal Medicine

## 2020-01-24 DIAGNOSIS — I87332 Chronic venous hypertension (idiopathic) with ulcer and inflammation of left lower extremity: Secondary | ICD-10-CM | POA: Diagnosis not present

## 2020-01-26 ENCOUNTER — Encounter (HOSPITAL_BASED_OUTPATIENT_CLINIC_OR_DEPARTMENT_OTHER): Payer: Medicare Other | Admitting: Internal Medicine

## 2020-01-28 NOTE — Progress Notes (Signed)
[name], [name] ([id number]) Visit Report for 01/17/2020 Allergy List Details Patient Name: Date of Service: [name] [name], [name] 01/17/2020 9:00 A M Medical Record Number: [id number] Patient Account Number: [id number] Date of Birth/Sex: Treating RN: [birth year] (64 y.o. [name] Primary Care [name]: [name] Other Clinician: Referring [name]: Treating [name]/Extender: [name] in Treatment: 0 Allergies Active Allergies penicillin Reaction: hives Severity: Severe doxycycline Reaction: vomiting Allergy Notes Electronic Signature(s) Signed: 01/17/2020 5:25:51 PM By: Baruch Gouty RN, BSN Entered By: Baruch Gouty on 01/17/2020 09:06:48 -------------------------------------------------------------------------------- Arrival Information Details Patient Name: Date of Service: [name], [name] 01/17/2020 9:00 A M Medical Record Number: [id number] Patient Account Number: [id number] Date of Birth/Sex: Treating RN: May 31, 1956 (64 y.o. [name] Primary Care [name]: [name] Other Clinician: Referring Ashyra Cantin: Treating [name]/Extender: [name] in Treatment: 0 Visit Information Patient Arrived: Wheel Chair Arrival Time: 08:59 Accompanied By: spouse Transfer Assistance: None Patient Identification Verified: Yes Secondary Verification Process Completed: Yes Patient Has Alerts: Yes Patient Alerts: L ABI =1.12, TBI = .92 R ABI= 1.02, TBI= .58 History Since Last Visit Any new allergies or adverse reactions: No Had a fall or experienced change in activities of daily living that may affect risk of falls: No Signs or symptoms of abuse/neglect since last No visito Hospitalized since last visit: Yes Has Dressing in Place as Prescribed: Yes Has Footwear/Offloading in Place as Prescribed: Yes Left: Removable Cast Walker/Walking Boot Pain Present Now: Yes Electronic Signature(s) Signed:  01/17/2020 5:25:51 PM By: Baruch Gouty RN, BSN Entered By: Baruch Gouty on 01/17/2020 09:04:08 -------------------------------------------------------------------------------- Clinic Level of Care Assessment Details Patient Name: Date of Service: [facility] [name], [name] 01/17/2020 9:00 A M Medical Record Number: [id number] Patient Account Number: [id number] Date of Birth/Sex: Treating RN: 09-16-1955 (64 y.o. [name] Primary Care [name]: [name] Other Clinician: Referring [name]: Treating [name]/Extender: [name] in Treatment: 0 Clinic Level of Care Assessment Items TOOL 1 Quantity Score X- 1 0 Use when EandM and Procedure is performed on INITIAL visit ASSESSMENTS - Nursing Assessment / Reassessment X- 1 20 General Physical Exam (combine w/ comprehensive assessment (listed just below) when performed on new pt. evals) X- 1 25 Comprehensive Assessment (HX, ROS, Risk Assessments, Wounds Hx, etc.) ASSESSMENTS - Wound and Skin Assessment / Reassessment []  - 0 Dermatologic / Skin Assessment (not related to wound area) ASSESSMENTS - Ostomy and/or Continence Assessment and Care []  - 0 Incontinence Assessment and Management []  - 0 Ostomy Care Assessment and Management (repouching, etc.) PROCESS - Coordination of Care X - Simple Patient / Family Education for ongoing care 1 15 []  - 0 Complex (extensive) Patient / Family Education for ongoing care X- 1 10 Staff obtains Programmer, systems, Records, T Results / Process Orders est X- 1 10 Staff telephones HHA, Nursing Homes / Clarify orders / etc []  - 0 Routine Transfer to another Facility (non-emergent condition) []  - 0 Routine Hospital Admission (non-emergent condition) X- 1 15 New Admissions / Biomedical engineer / Ordering NPWT Apligraf, etc. , []  - 0 Emergency Hospital Admission (emergent condition) PROCESS - Special Needs []  - 0 Pediatric / Minor Patient Management []  -  0 Isolation Patient Management []  - 0 Hearing / Language / Visual special needs []  - 0 Assessment of Community assistance (transportation, D/C planning, etc.) []  - 0 Additional assistance / Altered mentation []  - 0 Support Surface(s) Assessment (bed, cushion, seat, etc.) INTERVENTIONS - Miscellaneous []  -  0 External ear exam []  - 0 Patient Transfer (multiple staff / Civil Service fast streamer / Similar devices) []  - 0 Simple Staple / Suture removal (25 or less) []  - 0 Complex Staple / Suture removal (26 or more) []  - 0 Hypo/Hyperglycemic Management (do not check if billed separately) []  - 0 Ankle / Brachial Index (ABI) - do not check if billed separately Has the patient been seen at the hospital within the last three years: Yes Total Score: 95 Level Of Care: New/Established - Level 3 Electronic Signature(s) Signed: 01/19/2020 5:36:57 PM By: [name] Hurst RN, BSN Signed: 01/19/2020 5:36:57 PM By: [name] Hurst RN, BSN Entered By: [name] on 01/18/2020 07:30:45 -------------------------------------------------------------------------------- Compression Therapy Details Patient Name: Date of Service: [name], [name] 01/17/2020 9:00 A M Medical Record Number: [id number] Patient Account Number: [id number] Date of Birth/Sex: Treating RN: [birth year] (64 y.o. [name] Primary Care [name]: [name] Other Clinician: Referring [name]: Treating [name]/Extender: [name] in Treatment: 0 Compression Therapy Performed for Wound Assessment: Wound #14 Left,Lateral Ankle Performed By: Clinician [name] Hurst, RN Compression Type: Rolena Infante Post Procedure Diagnosis Same as Pre-procedure Electronic Signature(s) Signed: 01/19/2020 5:36:57 PM By: [name] Hurst RN, BSN Entered By: [name] on 01/17/2020 11:00:33 -------------------------------------------------------------------------------- Compression Therapy Details Patient Name: Date  of Service: [name], [name] 01/17/2020 9:00 A M Medical Record Number: [id number] Patient Account Number: [id number] Date of Birth/Sex: Treating RN: 31-Oct-1955 (64 y.o. [name] Primary Care [name]: [name] Other Clinician: Referring [name]: Treating Keiondra Brookover/Extender: [name] in Treatment: 0 Compression Therapy Performed for Wound Assessment: Wound #15 Left,Medial Malleolus Performed By: Clinician [name] Hurst, RN Compression Type: Rolena Infante Post Procedure Diagnosis Same as Pre-procedure Electronic Signature(s) Signed: 01/19/2020 5:36:57 PM By: [name] Hurst RN, BSN Entered By: [name] on 01/17/2020 11:00:33 -------------------------------------------------------------------------------- Compression Therapy Details Patient Name: Date of Service: [name], [name] 01/17/2020 9:00 A M Medical Record Number: [id number] Patient Account Number: [id number] Date of Birth/Sex: Treating RN: 1955-07-23 (64 y.o. [name] Primary Care Pilot Prindle: [name] Other Clinician: Referring Catrinia Racicot: Treating Aundrea Horace/Extender: [name] in Treatment: 0 Compression Therapy Performed for Wound Assessment: Wound #16 Left,Medial Lower Leg Performed By: Clinician [name] Hurst, RN Compression Type: Rolena Infante Post Procedure Diagnosis Same as Pre-procedure Electronic Signature(s) Signed: 01/19/2020 5:36:57 PM By: [name] Hurst RN, BSN Entered By: [name] on 01/17/2020 11:00:33 -------------------------------------------------------------------------------- Encounter Discharge Information Details Patient Name: Date of Service: [name], [name] 01/17/2020 9:00 A M Medical Record Number: [id number] Patient Account Number: [id number] Date of Birth/Sex: Treating RN: 1956-01-20 (64 y.o. [name] Primary Care Herberta Pickron: [name] Other Clinician: Referring Sunshine Mackowski: Treating  Sylus Stgermain/Extender: [name] in Treatment: 0 Encounter Discharge Information Items Post Procedure Vitals Discharge Condition: Stable Temperature (F): 98.3 Ambulatory Status: Wheelchair Pulse (bpm): 61 Discharge Destination: Home Respiratory Rate (breaths/min): 18 Transportation: Private Auto Blood Pressure (mmHg): 113/68 Accompanied By: self Schedule Follow-up Appointment: Yes Clinical Summary of Care: Patient Declined Electronic Signature(s) Signed: 01/17/2020 5:06:17 PM By: Kela Millin Entered By: Kela Millin on 01/17/2020 11:34:26 -------------------------------------------------------------------------------- Lower Extremity Assessment Details Patient Name: Date of Service: [facility] [name], [name] 01/17/2020 9:00 A M Medical Record Number: [id number] Patient Account Number: [id number] Date of Birth/Sex: Treating RN: 1956-04-14 (64 y.o. [name] Primary Care Kariem Wolfson: [name] Other Clinician: Referring Caron Tardif: Treating Brizza Nathanson/Extender: [name] in Treatment: 0  Edema Assessment Assessed: [Left: No] [Right: No] Edema: [Left: Ye] [Right: s] Calf Left: Right: Point of Measurement: cm From Medial Instep 27.3 cm cm Ankle Left: Right: Point of Measurement: cm From Medial Instep 22 cm cm Vascular Assessment Pulses: Dorsalis Pedis Palpable: [Left:Yes] Electronic Signature(s) Signed: 01/17/2020 5:25:51 PM By: Baruch Gouty RN, BSN Entered By: Baruch Gouty on 01/17/2020 09:31:47 -------------------------------------------------------------------------------- Multi Wound Chart Details Patient Name: Date of Service: [name], [name] 01/17/2020 9:00 A M Medical Record Number: [id number] Patient Account Number: [id number] Date of Birth/Sex: Treating RN: [birth year] (64 y.o. [name] Primary Care Allan Bacigalupi: [name] Other Clinician: Referring Ameli Sangiovanni: Treating  Chimaobi Casebolt/Extender: [name] in Treatment: 0 Vital Signs Height(in): 68 Pulse(bpm): 61 Weight(lbs): Blood Pressure(mmHg): 113/68 Body Mass Index(BMI): Temperature(F): 98.3 Respiratory Rate(breaths/min): 18 Photos: [14:No Photos Left, Lateral Ankle] [15:No Photos Left, Medial Malleolus] [16:No Photos Left, Medial Lower Leg] Wound Location: [14:Surgical Injury] [15:Gradually Appeared] [16:Gradually Appeared] Wounding Event: [14:Open Surgical Wound] [15:Venous Leg Ulcer] [16:Venous Leg Ulcer] Primary Etiology: [14:Peripheral Venous Disease,] [15:Peripheral Venous Disease,] [16:Peripheral Venous Disease,] Comorbid History: [14:Osteoarthritis, Confinement Anxiety Osteoarthritis, Confinement Anxiety Osteoarthritis, Confinement Anxiety 12/15/2019] [15:12/30/2019] [16:12/30/2019] Date Acquired: [14:0] [15:0] [16:0] [name] of Treatment: [14:Open] [15:Open] [16:Open] Wound Status: [14:1.8x0.5x0.5] [15:5.5x2.7x0.8] [16:1.8x1.2x0.1] Measurements L x W x D (cm) [14:0.707] [15:11.663] [16:1.696] A (cm) : rea [14:0.353] [15:9.331] [16:0.17] Volume (cm) : [14:12] Position 1 (o'clock): [14:0.6] Maximum Distance 1 (cm): [14:Yes] [15:No] [16:No] Tunneling: [14:Full Thickness Without Exposed] [15:Full Thickness With Exposed Support Full Thickness Without Exposed] Classification: [14:Support Structures Medium] [15:Structures Medium] [16:Support Structures Medium] Exudate A mount: [14:Serosanguineous] [15:Serosanguineous] [16:Serous] Exudate Type: [14:red, brown] [15:red, brown] [16:amber] Exudate Color: [14:Distinct, outline attached] [15:Flat and Intact] [16:Flat and Intact] Wound Margin: [14:Medium (34-66%)] [15:Medium (34-66%)] [16:Large (67-100%)] Granulation A mount: [14:Red] [15:Red] [16:Red] Granulation Quality: [14:Medium (34-66%)] [15:Medium (34-66%)] [16:None Present (0%)] Necrotic A mount: [14:Adherent Slough] [15:Eschar, Adherent Slough] [16:N/A] Necrotic  Tissue: [14:Fat Layer (Subcutaneous Tissue): Yes Fat Layer (Subcutaneous Tissue): Yes Fat Layer (Subcutaneous Tissue): Yes] Exposed Structures: [14:Fascia: No Tendon: No Muscle: No Joint: No Bone: No None] [15:Tendon: Yes Fascia: No Muscle: No Joint: No Bone: No Small (1-33%)] [16:Fascia: No Tendon: No Muscle: No Joint: No Bone: No Small (1-33%)] Epithelialization: [14:N/A] [15:Debridement - Excisional] [16:N/A] Debridement: Pre-procedure Verification/Time Out N/A [15:10:52] [16:N/A] Taken: [14:N/A] [15:Tendon, Subcutaneous, Slough] [16:N/A] Tissue Debrided: [14:N/A] [15:Skin/Subcutaneous Tissue/Muscle] [16:N/A] Level: [14:N/A] [15:14.85] [16:N/A] Debridement A (sq cm): [14:rea N/A] [15:Blade, Curette] [16:N/A] Instrument: [14:N/A] [15:Minimum] [16:N/A] Bleeding: [14:N/A] [15:Pressure] [16:N/A] Hemostasis A chieved: [14:N/A] [15:3] [16:N/A] Procedural Pain: [14:N/A] [15:0] [16:N/A] Post Procedural Pain: [14:N/A] [15:Procedure was tolerated well] [16:N/A] Debridement Treatment Response: [14:N/A] [15:5.5x2.7x0.8] [16:N/A] Post Debridement Measurements L x W x D (cm) [14:N/A] [15:9.331] [16:N/A] Post Debridement Volume: (cm) [14:Compression Therapy] [15:Compression Therapy] [16:Compression Therapy] Procedures Performed: [15:Debridement] Treatment Notes Electronic Signature(s) Signed: 01/17/2020 6:57:29 PM By: Linton Ham MD Signed: 01/27/2020 5:50:16 PM By: [name] RN Entered By: Linton Ham on 01/17/2020 11:21:15 -------------------------------------------------------------------------------- Multi-Disciplinary Care Plan Details Patient Name: Date of Service: [name], [name] 01/17/2020 9:00 A M Medical Record Number: [id number] Patient Account Number: [id number] Date of Birth/Sex: Treating RN: September 19, 1955 (64 y.o. [name] Primary Care Leimomi Zervas: [name] Other Clinician: Referring Syleena Mchan: Treating Shamarr Faucett/Extender: [name] in Treatment: 0 Active Inactive Abuse / Safety / Falls / Self Care Management Nursing Diagnoses: Potential for falls Potential for injury related to falls Goals: Patient will not experience any injury related to falls Date Initiated:  01/17/2020 Target Resolution Date: 02/17/2020 Goal Status: Active Patient/caregiver will verbalize/demonstrate measures taken to prevent injury and/or falls Date Initiated: 01/17/2020 Target Resolution Date: 02/17/2020 Goal Status: Active Interventions: Assess Activities of Daily Living upon admission and as needed Assess fall risk on admission and as needed Assess: immobility, friction, shearing, incontinence upon admission and as needed Assess impairment of mobility on admission and as needed per policy Assess personal safety and home safety (as indicated) on admission and as needed Assess self care needs on admission and as needed Provide education on fall prevention Provide education on personal and home safety Notes: Wound/Skin Impairment Nursing Diagnoses: Impaired tissue integrity Knowledge deficit related to ulceration/compromised skin integrity Goals: Patient/caregiver will verbalize understanding of skin care regimen Date Initiated: 01/17/2020 Target Resolution Date: 02/17/2020 Goal Status: Active Ulcer/skin breakdown will have a volume reduction of 30% by week 4 Date Initiated: 01/17/2020 Target Resolution Date: 02/17/2020 Goal Status: Active Interventions: Assess patient/caregiver ability to obtain necessary supplies Assess patient/caregiver ability to perform ulcer/skin care regimen upon admission and as needed Assess ulceration(s) every visit Provide education on ulcer and skin care Notes: Electronic Signature(s) Signed: 01/19/2020 5:36:57 PM By: [name] Hurst RN, BSN Entered By: [name] on 01/17/2020 11:03:37 -------------------------------------------------------------------------------- Pain Assessment  Details Patient Name: Date of Service: [name], [name] 01/17/2020 9:00 A M Medical Record Number: [id number] Patient Account Number: [id number] Date of Birth/Sex: Treating RN: February 23, 1956 (64 y.o. [name] Primary Care Josslin Sanjuan: [name] Other Clinician: Referring Derrion Tritz: Treating Dashonda Bonneau/Extender: [name] in Treatment: 0 Active Problems Location of Pain Severity and Description of Pain Patient Has Paino No Site Locations Rate the pain. Current Pain Level: 0 Pain Management and Medication Current Pain Management: Electronic Signature(s) Signed: 01/17/2020 5:25:51 PM By: Baruch Gouty RN, BSN Entered By: Baruch Gouty on 01/17/2020 09:49:06 -------------------------------------------------------------------------------- Patient/Caregiver Education Details Patient Name: Date of Service: [name], [name] 8/17/2021andnbsp9:00 A M Medical Record Number: [id number] Patient Account Number: [id number] Date of Birth/Gender: Treating RN: 1955-12-31 (64 y.o. [name] Primary Care Physician: [name] Other Clinician: Referring Physician: Treating Physician/Extender: [name] in Treatment: 0 Education Assessment Education Provided To: Patient Education Topics Provided Wound/Skin Impairment: Methods: Explain/Verbal Responses: State content correctly Electronic Signature(s) Signed: 01/19/2020 5:36:57 PM By: [name] Hurst RN, BSN Entered By: [name] on 01/18/2020 07:30:06 -------------------------------------------------------------------------------- Wound Assessment Details Patient Name: Date of Service: [name], [name] 01/17/2020 9:00 A M Medical Record Number: [id number] Patient Account Number: [id number] Date of Birth/Sex: Treating RN: 05-15-1956 (64 y.o. [name] Primary Care Migel Hannis: [name] Other Clinician: Referring Remington Highbaugh: Treating  Dorn Hartshorne/Extender: [name] in Treatment: 0 Wound Status Wound Number: 14 Primary Open Surgical Wound Etiology: Wound Location: Left, Lateral Ankle Wound Status: Open Wounding Event: Surgical Injury Comorbid Peripheral Venous Disease, Osteoarthritis, Confinement Date Acquired: 12/15/2019 History: Anxiety [name] Of Treatment: 0 Clustered Wound: No Photos Photo Uploaded By: Mikeal Hawthorne on 01/19/2020 14:39:31 Wound Measurements Length: (cm) 1.8 Width: (cm) 0.5 Depth: (cm) 0.5 Area: (cm) 0.707 Volume: (cm) 0.353 % Reduction in Area: % Reduction in Volume: Epithelialization: None Tunneling: Yes Position (o'clock): 12 Maximum Distance: (cm) 0.6 Undermining: No Wound Description Classification: Full Thickness Without Exposed Support Structures Wound Margin: Distinct, outline attached Exudate Amount: Medium Exudate Type: Serosanguineous Exudate Color: red, brown Wound Bed Granulation Amount: Medium (34-66%) Granulation Quality: Red Necrotic Amount: Medium (34-66%) Necrotic Quality: Adherent Slough Foul [name] After Cleansing: No Slough/Fibrino Yes Exposed  Structure Fascia Exposed: No Fat Layer (Subcutaneous Tissue) Exposed: Yes Tendon Exposed: No Muscle Exposed: No Joint Exposed: No Bone Exposed: No Electronic Signature(s) Signed: 01/17/2020 5:25:51 PM By: Baruch Gouty RN, BSN Entered By: Baruch Gouty on 01/17/2020 09:44:12 -------------------------------------------------------------------------------- Wound Assessment Details Patient Name: Date of Service: [name], [name] 01/17/2020 9:00 A M Medical Record Number: [id number] Patient Account Number: [id number] Date of Birth/Sex: Treating RN: 1955/07/11 (64 y.o. [name] Primary Care Alecia Doi: [name] Other Clinician: Referring Neelam Tiggs: Treating Tarra Pence/Extender: [name] in Treatment: 0 Wound Status Wound Number: 15 Primary  Venous Leg Ulcer Etiology: Wound Location: Left, Medial Malleolus Wound Status: Open Wounding Event: Gradually Appeared Comorbid Peripheral Venous Disease, Osteoarthritis, Confinement Date Acquired: 12/30/2019 History: Anxiety [name] Of Treatment: 0 Clustered Wound: No Photos Photo Uploaded By: Mikeal Hawthorne on 01/19/2020 14:38:55 Wound Measurements Length: (cm) 5.5 Width: (cm) 2.7 Depth: (cm) 0.8 Area: (cm) 11.663 Volume: (cm) 9.331 % Reduction in Area: % Reduction in Volume: Epithelialization: Small (1-33%) Tunneling: No Undermining: No Wound Description Classification: Full Thickness With Exposed Support Structures Wound Margin: Flat and Intact Exudate Amount: Medium Exudate Type: Serosanguineous Exudate Color: red, brown Foul [name] After Cleansing: No Slough/Fibrino Yes Wound Bed Granulation Amount: Medium (34-66%) Exposed Structure Granulation Quality: Red Fascia Exposed: No Necrotic Amount: Medium (34-66%) Fat Layer (Subcutaneous Tissue) Exposed: Yes Necrotic Quality: Eschar, Adherent Slough Tendon Exposed: Yes Muscle Exposed: No Joint Exposed: No Bone Exposed: No Electronic Signature(s) Signed: 01/17/2020 5:25:51 PM By: Baruch Gouty RN, BSN Entered By: Baruch Gouty on 01/17/2020 09:46:41 -------------------------------------------------------------------------------- Wound Assessment Details Patient Name: Date of Service: [name], [name] 01/17/2020 9:00 A M Medical Record Number: [id number] Patient Account Number: [id number] Date of Birth/Sex: Treating RN: [birth year] (64 y.o. [name] Primary Care Zoriyah Scheidegger: [name] Other Clinician: Referring Jamarie Mussa: Treating Yasha Tibbett/Extender: [name] in Treatment: 0 Wound Status Wound Number: 16 Primary Venous Leg Ulcer Etiology: Wound Location: Left, Medial Lower Leg Wound Status: Open Wounding Event: Gradually Appeared Comorbid Peripheral Venous Disease,  Osteoarthritis, Confinement Date Acquired: 12/30/2019 History: Anxiety [name] Of Treatment: 0 Clustered Wound: No Photos Photo Uploaded By: Mikeal Hawthorne on 01/19/2020 14:38:55 Wound Measurements Length: (cm) 1.8 Width: (cm) 1.2 Depth: (cm) 0.1 Area: (cm) 1.696 Volume: (cm) 0.17 % Reduction in Area: % Reduction in Volume: Epithelialization: Small (1-33%) Tunneling: No Undermining: No Wound Description Classification: Full Thickness Without Exposed Support Structures Wound Margin: Flat and Intact Exudate Amount: Medium Exudate Type: Serous Exudate Color: amber Foul [name] After Cleansing: No Slough/Fibrino No Wound Bed Granulation Amount: Large (67-100%) Exposed Structure Granulation Quality: Red Fascia Exposed: No Necrotic Amount: None Present (0%) Fat Layer (Subcutaneous Tissue) Exposed: Yes Tendon Exposed: No Muscle Exposed: No Joint Exposed: No Bone Exposed: No Electronic Signature(s) Signed: 01/17/2020 5:25:51 PM By: Baruch Gouty RN, BSN Entered By: Baruch Gouty on 01/17/2020 09:48:21 -------------------------------------------------------------------------------- Vitals Details Patient Name: Date of Service: [name], [name] 01/17/2020 9:00 A M Medical Record Number: [id number] Patient Account Number: [id number] Date of Birth/Sex: Treating RN: 10-07-1955 (64 y.o. [name] Primary Care Harrietta Incorvaia: [name] Other Clinician: Referring Trevor Duty: Treating Janetta Vandoren/Extender: [name] in Treatment: 0 Vital Signs Time Taken: 09:05 Temperature (F): 98.3 Height (in): 68 Pulse (bpm): 61 Source: Stated Respiratory Rate (breaths/min): 18 Blood Pressure (mmHg): 113/68 Reference Range: 80 - 120 mg / dl Electronic Signature(s) Signed: 01/17/2020 5:25:51 PM By: Baruch Gouty RN, BSN Entered By: Baruch Gouty on 01/17/2020 09:06:37

## 2020-01-28 NOTE — Progress Notes (Signed)
BREE, HEINZELMAN ([id number]) Visit Report for 01/17/2020 Chief Complaint Document Details Patient Name: Date of Service: [facility] [name], [name] 01/17/2020 9:00 A M Medical Record Number: [id number] Patient Account Number: [id number] Date of Birth/Sex: Treating RN: [birth year] (64 y.o. [name] Primary Care Provider: [name] Other Clinician: Referring Provider: Treating Provider/Extender: [name] in Treatment: 0 Information Obtained from: Patient Chief Complaint patient is been followed long-term in this clinic for venous insufficiency ulcers with inflammation, hypertension and ulceration over the medial ankle bilaterally. 01/17/2020; this is a patient who is here for review of postoperative wounds on the left lateral ankle and recurrence of venous stasis ulceration on the left medial Electronic Signature(s) Signed: 01/17/2020 6:57:29 PM By: Linton Ham MD Entered By: Linton Ham on 01/17/2020 11:22:32 -------------------------------------------------------------------------------- Debridement Details Patient Name: Date of Service: [name], [name] 01/17/2020 9:00 A M Medical Record Number: [id number] Patient Account Number: [id number] Date of Birth/Sex: Treating RN: 1955/10/11 (64 y.o. [name] Primary Care Provider: [name] Other Clinician: Referring Provider: Treating Provider/Extender: [name] in Treatment: 0 Debridement Performed for Assessment: Wound #15 Left,Medial Malleolus Performed By: Physician [name]., MD Debridement Type: Debridement Severity of Tissue Pre Debridement: Other severity specified Level of Consciousness (Pre-procedure): Awake and Alert Pre-procedure Verification/Time Out Yes - 10:52 Taken: Start Time: 10:52 T Area Debrided (L x W): otal 5.5 (cm) x 2.7 (cm) = 14.85 (cm) Tissue and other material debrided: Viable, Non-Viable, Slough, Subcutaneous, Tendon,  Slough Level: Skin/Subcutaneous Tissue/Muscle Debridement Description: Excisional Instrument: Blade, Curette Bleeding: Minimum Hemostasis Achieved: Pressure End Time: 10:54 Procedural Pain: 3 Post Procedural Pain: 0 Response to Treatment: Procedure was tolerated well Level of Consciousness (Post- Awake and Alert procedure): Post Debridement Measurements of Total Wound Length: (cm) 5.5 Width: (cm) 2.7 Depth: (cm) 0.8 Volume: (cm) 9.331 Character of Wound/Ulcer Post Debridement: Improved Severity of Tissue Post Debridement: Other severity specified Post Procedure Diagnosis Same as Pre-procedure Electronic Signature(s) Signed: 01/17/2020 6:57:29 PM By: Linton Ham MD Signed: 01/27/2020 5:50:16 PM By: [name] RN Entered By: Linton Ham on 01/17/2020 11:21:39 -------------------------------------------------------------------------------- HPI Details Patient Name: Date of Service: [name], [name] 01/17/2020 9:00 A M Medical Record Number: [id number] Patient Account Number: [id number] Date of Birth/Sex: Treating RN: April 21, 1956 (64 y.o. [name] Primary Care Provider: [name] Other Clinician: Referring Provider: Treating Provider/Extender: [name] in Treatment: 0 History of Present Illness HPI Description: the remaining wound is over the left medial ankle. Similar wound over the right medial ankle healed largely with use of Apligraf. Most recently we have been using Hydrofera Blue over this wound with considerable improvement. The patient has been extensively worked up in the past for her venous insufficiency and she is not a candidate for antireflux surgery although I have none of the details available currently. 08/24/14; considerable improvement today. About 50% of this wound areas now epithelialized. The base of the wound appears to be healthier granulation.as opposed to last week when she had deteriorated a considerable  improvement 08/17/14; unfortunately the wound has regressed somewhat. The areas of epithelialization from the superior aspect are not nearly as healthy as they were last week. The patient thinks her Hydrofera Blue slipped. 09/07/14; unfortunately the area has markedly regressed in the 2 weeks since I've seen this. There is an odor surrounding erythema. The healthy granulation tissue that we had at the base of the wound now is a dusky  color. The nurse reports green drainage 09/14/14; the area looks somewhat better than last week. There is less erythema and less drainage. The culture I did did not show any growth. Nevertheless I think it is better to continue the Cipro and doxycycline for a further week. The remaining wound area was debridement. 09/21/14. Wound did not require debridement last week. Still less erythema and less drainage. She can complete her antibiotics. The areas of epithelialization in the superior aspect of the wound do not look as healthy as they did some weeks ago 10/05/14 continued improvement in the condition of this wound. There is advancing epithelialization. Less aggressive debridement required 10/19/14 continued improvement in the condition and volume of this wound. Less aggressive debridement to the inferior part of this to remove surface slough and fibrinous eschar 11/02/14 no debridement is required. The surface granulation appears healthy although some of her islands of epithelialization seem to have regressed. No evidence of infection 11/16/14; lites surface debridement done of surface eschar. The wound does not look to be unhealthy. No evidence of infection. Unfortunately the patient has had podiatry issues in the right foot and for some reason has redeveloped small surface ulcerations in the medial right ankle. Her original presentation involved wounds in this area 11/23/14 no debridement. The area on the right ankle has enlarged. The left ankle wound appears stable in terms of  the surface although there is periwound inflammation. There has been regression in the amount of new skin 11/30/14 no debridement. Both wound areas appear healthy. There was no evidence of infection. The the new area on the right medial ankle has enlarged although that both the surfaces appear to be stable. 12/07/14; Debridement of the right medial ankle wound. No no debridement was done on the left. 12/14/14 no major change in and now bilateral medial ankle wounds. Both of these are very painful but the no overt evidence of infection. She has had previous venous ablation 12/21/14; patient states that her right medial ankle wound is considerably more painful last week than usual. Her left is also somewhat painful. She could not tolerate debridement. The right medial ankle wound has fibrinous surface eschar 12/28/14 this is a patient with severe bilateral venous insufficiency ulcers. For a considerable period of time we actually had the one on the right medial ankle healed however this recently opened up again in June. The left medial ankle wound has been a refractory area with some absent flows. We had some success with Hydrofera Blue on this area and it literally closed by 50% however it is recently opened up Foley. Both of these were debridement today of surface eschar. She tolerates this poorly 01/25/15: No change in the status of this. Thick adherent escar. Very poor tolerance of any attempt at debridement. I had healed the right medial malleolus wound for a considerable amount of time and had the left one down to about 50% of the volume although this is totally regressed over the last 48 weeks. Further the right leg has reopened. she is trying to make a appointment with pain and vascular, previous ablations with Dr. Aleda Grana. I do not believe there is an arterial insufficiency issue here 02/01/15 the status of the adherent eschar bilaterally is actually improved. No debridement was done. She did not  manage to get vascular studies done 02/08/15 continued debridement of the area was done today. The slough is less adherent and comes off with less pressure. There is no surrounding infection peripheral pulses are intact 02/15/15  selective debridement with a disposable curette. Again the slough is less adherent and comes off with less difficulty. No surrounding infection peripheral pulses are intact. 02/22/15 selective debridement of the right medial ankle wound. Slough comes off with less difficulty. No obvious surrounding infection peripheral pulses are intact I did not debridement the one on the left. Both of these are stable to improved 03/01/15 selective debridement of both wound areas using a curette to. Adherent slough cup soft with less difficulty. No obvious surrounding infection. The patient tells me that 2 days ago she noted a rash above the right leg wrap. She did not have this on her lower legs when she change this over she arrives with widespread left greater than right almost folliculitis-looking rash which is extremely pruritic. I don't see anything to culture here. There is no rash on the rest of her body. She feels well systemically. 03/08/15; selective debridement of both wounds using a curette. Base of this does not look unhealthy. She had limegreen drainage coming out of the left leg wound and describes a lot of drainage. The rash on her left leg looks improved to. No cultures were done. 03/22/15; patient was not here last week. Basal wounds does not look healthy and there is no surrounding erythema. No drainage. There is still a rash on the left leg that almost looks vasculitic however it is clearly limited to the top of where the wrap would be. 04/05/15; on the right required a surgical debridement of surface eschar and necrotic subcutaneous tissue. I did not debridement the area on the left. These continue to be large open wounds that are not changing that much. We were successful at  one point in healing the area on the right, and at the same time the area on the left was roughly half the size of current measurements. I think a lot of the deterioration has to do with the prolonged time the patient is on her feet at work 04/19/15 I attempted-like surface debridement bilaterally she does not tolerate this. She tells me that she was in allergic care yesterday with extreme pain over her left lateral malleolus/ankle and was told that she has an "sprain" 05/03/15; large bilateral venous insufficiency wounds over the medial malleolus/medial aspect of her ankles. She complains of copious amounts of drainage and his usual large amounts of pain. There is some increasing erythema around the wound on the right extending into the medial aspect of her foot to. historically she came in with these wounds the right one healed and the left one came down to roughly half its current size however the right one is reopened and the left is expanded. 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 05/10/15 large bilateral venous insufficiency wounds. There is less adherence surface left however the surface culture that I did last week grew pseudomonas therefore bilateral selective debridement score necessary. There is surrounding erythema. The patient describes severe bilateral drainage and a lot of pain in the left ankle. Apparently her podiatrist was were ready to do a cortisone shot 05/17/15; the patient complains of pain and again copious amounts of drainage. 05/24/15; we used Iodo flex last week. Patient notes considerable improvement in wound drainage. Only needed to change this once. 05/31/15; we continued Iodoflex; the base of these large wounds bilaterally is not too bad but there is probably likely a significant bioburden here. I would like to debridement just doesn't  tolerate it. 06/06/14 I would like to continue the Iodoflex although she still hasn't  managed to obtain supplies. She has bilateral medial malleoli or large wounds which are mostly superficial. Both of them are covered circumferentially with some nonviable fibrinous slough although she tolerates debridement very poorly. She apparently has an appointment for an ablation on the right leg by interventional radiology. 06/14/15; the patient arrives with the wounds and static condition. We attempted a debridement although she does not do well with this secondary to pain. I 07/05/15; wounds are not much smaller however there appears to be a cleaner granulating base. The left has tight fibrinous slough greater than the right. Debridement is tolerated poorly due to pain. Iodoflex is done more for these wounds in any of the multitude of different dressings I have tried on the left 1 and then subsequently the right. 07/12/15; no change in the condition of this wound. I am able to do an aggressive debridement on the right but not the left. She simply cannot tolerate it. We have been using Iodoflex which helps somewhat. It is worthwhile remembering that at one point we healed the right medial ankle wound and the left was about 25% of the current circumference. We have suggested returning to vascular surgery for review of possible further ablations for one reason or another she has not been able to do this. 07/26/15 no major change in the condition of either wound on her medial ankle. I did not attempt to debridement of these. She has been aggressively scrubbing these while she is in the shower at home. She has her supply of Iodoflex which seems to have done more for these wounds then anything I have put on recently. 08/09/15 wound area appears larger although not verified by measurements. Using Iodoflex 09/05/2015 -- she was here for avisit today but had significant problems with the wound and I was asked to see her for a physician opinion. I have summarize that this lady has had surgery on her left lower  extremity about 10 years ago where the possible veins stripping was done. She has had an opinion from interventional radiology around November 2016 where no further sclerotherapy was ordered. The patient works 12 hours a day and stands on a concrete floor with work boots and is unable to get the proper compression she requires and cannot elevate her limbs appropriately at any given time. She has recently grown Pseudomonas from her wound culture but has not started her ciprofloxacin which was called in for her. 09/13/15 this continues to be a difficult situation for this patient. At one point I had this wound down to a 1.5 x 1.5" wound on her left leg. This is deteriorated and the right leg has reopened. She now has substantial wounds on her medial calcaneus, malleoli and into her lower leg. One on the left has surface eschar but these are far too painful for me to debridement here. She has a vascular surgery appointment next week to see if anything can be done to help here. I think she has had previous ablations several years ago at [facility]. She has no major edema. She tells me that she did not get product last time [facility]] and went for several days without it. She continues to work in work boots 12 hours a day. She cannot get compression/4-layer under her work boots. 09/20/15 no major change. Periwound edema control was not very good. Her point with pain and vascular is next Wednesday the 25th  09/28/15; the patient is seen vascular surgery and is apparently scheduled for repeat duplex ultrasounds of her bilateral lower legs next week. 10/05/15; the patient was seen by Dr. Doren Custard of vascular surgery. He feels that she should have arterial insufficiency excluded as cause/contributed to her nonhealing stage she is therefore booked for an arteriogram. She has apparently monophasic signals in the dorsalis pedis pulses. She also of course has known severe chronic venous insufficiency with previous  procedures as noted previously. I had another long discussion with the patient today about her continuing to work 12 hour shifts. I've written her out for 2 months area had concerns about this as her work location is currently undergoing significant turmoil and this may lead to her termination. She is aware of this however I agree with her that she simply cannot continue to stand for 12 hours multiple days a week with the substantial wound areas she has. 10/19/15; the Dr. Doren Custard appointment was largely for an arteriogram which was normal. She does not have an arterial issue. He didn't make a comment about her chronic venous insufficiency for which she has had previous ablations. Presumably it was not felt that anything additional could be done. The patient is now out of work as I prescribed 2 weeks ago. Her wounds look somewhat less aggravated presumably because of this. I felt I would give debridement another try today 10/25/15; no major change in this patient's wounds. We are struggling to get her product that she can afford into her own home through her insurance. 11/01/15; no major change in the patient's wounds. I have been using silver alginate as the most affordable product. I spoke to Dr. Marla Roe last week with her requested take her to the OR for surgical debridement and placement of ACEL. Dr. Marla Roe told me that she would be willing to do this however [facility] will not cover this, fortunately the patient has [place] healthcare of some variant 11/08/15; no major change in the patient's wounds. She has been completely nonviable surface that this but is in too much pain with any attempted debridement are clinic. I have arranged for her to see Dr. Marla Roe ham of plastic surgery and this appointment is on Monday. I am hopeful that they will take her to the OR for debridement, possible ACEL ultimately possible skin graft 11/22/15 no major change in the patient's wounds over her  bilateral medial calcaneus medial malleolus into the lower legs. Surface on these does not look too bad however on the left there is surrounding erythema and tenderness. This may be cellulitis or could him sleepy tinea. 11/29/15; no major changes in the patient's wounds over her bilateral medial malleolus. There is no infection here and I don't think any additional antibiotics are necessary. There is now plan to move forward. She sees Dr. Marla Roe in a week's time for preparation for operative debridement and ACEL placement I believe on 7/12. She then has a follow-up appointment with Dr. Marla Roe on 7/21 12/28/15; the patient returns today having been taken to the Between by Dr. Marla Roe 12/12/15 she underwent debridement, intraoperative cultures [which were negative]. She had placement of a wound VAC. Parent really ACEL was not available to be placed. The wound VAC foam apparently adhered to the wound since then she's been using silver alginate, Xeroform under Ace wraps. She still says there is a lot of drainage and a lot of pain 01/31/16; this is a patient I see monthly. I had referred her to Dr.  Dillingham him of plastic surgery for large wounds on her bilateral medial ankles. She has been to the OR twice once in early July and once in early August. She tells me over the last 3 weeks she has been using the wound VAC with ACEL underneath it. On the right we've simply been using silver alginate. Under Kerlix Coban wraps. 02/28/16; this is a patient I'm currently seeing monthly. She is gone on to have a skin graft over her large venous insufficiency ulcer on the left medial ankle. This was done by Dr. Marla Roe him. The patient is a bit perturbed about why she didn't have one on her right medial ankle wound. She has been using silver alginate to this. 03/06/16; I received a phone call from her plastic surgery Dr. Marla Roe. She expressed some concern about the viability of the skin graft she did on the  left medial ankle wound. Asked me to place Endoform on this. She told me she is not planning to do a subsequent skin graft on the right as the left one did not take very well. I had placed Hydrofera Blue on the right 03/13/16; continue to have a reasonably healthy wound on the right medial ankle. Down to 3 mm in terms of size. There is epithelialization here. The area on the left medial ankle is her skin graft site. I suppose the last week this looks somewhat better. She has an open area inferiorly however in the center there appears to be some viable tissue. There is a lot of surface callus and eschar that will eventually need to come off however none of this looked to be infected. Patient states that the is able to keep the dressing on for several days which is an improvement. 03/20/16 no major change in the circumference of either wound however on the left side the patient was at Dr. Eusebio Friendly office and they did a debridement of left wound. 50% of the wound seems to be epithelialized. I been using Endoform on the left Hydrofera Blue in the right 03/27/16; she arrives today with her wound is not looking as healthy as they did last week. The area on the right clearly has an adherent surface to this a very similar surface on the left. Unfortunately for this patient this is all too familiar problem. Clearly the Endoform is not working and will need to change that today that has some potential to help this surface. She does not tolerate debridement in this clinic very well. She is changing the dressing wants 04/03/16; patient arrives with the wounds looking somewhat better especially on the right. Dr. Migdalia Dk change the dressing to silver alginate when she saw her on Monday and also sold her some compression socks. The usefulness of the latter is really not clear and woman with severely draining wounds. 04/10/16; the patient is doing a bit of an experiment wearing the compression stockings that Dr.  Migdalia Dk provided her to her left leg and the out of legs based dressings that we provided to the right. 05/01/16; the patient is continuing to wear compression stockings Dr. Migdalia Dk provided her on the left that are apparently silver impregnated. She has been using Iodoflex to the right leg wound. Still a moderate amount of drainage, when she leaves here the wraps only last for 4 days. She has to change the stocking on the left leg every night 05/15/16; she is now using compression stockings bilaterally provided by Dr. Marla Roe. She is wearing a nonadherent layer over the wounds  so really I don't think there is anything specific being done to this now. She has some reduction on the left wound. The right is stable. I think all healing here is being done without a specific dressing 06/09/16; patient arrives here today with not much change in the wound certainly in diameter to large circular wounds over the medial aspect of her ankle bilaterally. Under the light of these services are certainly not viable for healing. There is no evidence of surrounding infection. She is wearing compression stockings with some sort of silver impregnation as prescribed by Dr. Marla Roe. She has a follow-up with her tomorrow. 06/30/16; no major change in the size or condition of her wounds. These are still probably covered with a nonviable surface. She is using only her purchase stockings. She did see Dr. Marla Roe who seemed to want to apply Dakin's solution to this I'm not extreme short what value this would be. I would suggest Iodoflex which she still has at home. 07/28/16; I follow Mrs. Osika episodically along with Dr. Marla Roe. 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 She has been applying a topical collagen ointment to both wounds with Adaptic. I don't think Dr. Marla Roe is planning to take her back to the OR. 08/19/16; I follow Mrs. Jeneen Rinks on a  monthly basis along with Dr. Marla Roe of plastic surgery. 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 I been following her for a number of years. At one point I was able to get the right medial malleolus wound to heal and had the left medial malleolus down to about half its current size however and I had to send her to plastic surgery for an operative debridement. Since then things have been stable to slightly improve the area on the right is slightly better one in the left about the same although there is much less adherent surface than I'm used to with this patient. She is using some form of liquid collagen gel that Dr. Marla Roe provided a Kerlix cover with the patient's own pressure stockings. She tells me that she has extreme pain in both ankles and along the lateral aspect of both feet. She has been unable to work for some period of time. She is telling me she is retiring at the beginning of April. She sees Dr. Doran Durand of orthopedics next week 09/22/16; patient has not seen Dr. Marla Roe since the last time she is here. I'm not really sure what she is using to the wounds other than bits and pieces of think she had left over including most recently Hydrofera Blue. She is using juxtalite stockings. She is having difficulty with her husband's recent illness "stroke". She is having to transport him to various doctors appointments. Dr. Marla Roe left her the option of a repeat debridement with ACEL however she has not been able to get the time to follow-up on this. She continues to have a fair amount of drainage out of these wounds with certainly precludes leaving dressings on all week 10/13/16; patient has not seen Dr. Marla Roe since she was last in our clinic. I'm not really sure what she is doing with the wounds, we did try to get her [facility] and I think she is actually using this most of the time. Because of drainage she  states she has to change this every second day although this is an improvement from what she used to do.  She went to see Dr. Doran Durand who did not think she had a muscular issue with regards to her feet, he referred her to a neurologist and I think the appointment is sometime in June. I changed her back to Iodoflex which she has used in the past but not recently. 11/03/16; the patient has been using Iodoflex although she ran out of this. Still claims that there is a lot of drainage although the wound does not look like this. No surrounding erythema. She has not been back to see Dr. Marla Roe 11/24/16; the patient has been using Iodoflex again but she ran out of it 2 or 3 days ago. There is no major change in the condition of either one of these wounds in fact they are larger and covered in a thick adherent surface slough/nonviable tissue especially on the left. She does not tolerate mechanical debridement in our clinic. Going back to see Dr. Marla Roe of plastic surgery for an operative debridement would seem reasonable. 12/15/16; the patient has not been back to see Dr. Marla Roe. She is been dealing with a series of illnesses and her husband which of monopolized her time. She is been using Sorbact which we largely supplied. She states the drainage is bad enough that it maximum she can go 2-3 days without changing the dressing 01/12/2017 -- the patient has not been back for about 4 weeks and has not seen Dr. Marla Roe not does she have any appointment pending. 01/23/17; patient has not seen Dr. Marla Roe even though I suggested this previously. She is using Santyl that was suggested last week by Dr. Con Memos this Cost her $16 through her insurance which is indeed surprising 02/12/17; continuing Santyl and the patient is changing this daily. A lot of drainage. She has not been back to see plastic surgery she is using an Ace wrap. Our intake nurse suggested wrap around stockings which would make a good  reasonable alternative 02/26/17; patient is been using Santyl and changing this daily due to drainage. She has not been to see plastic surgery she uses in April Ace wrap to control the edema. She did obtain extremitease stockings but stated that the edema in her leg was to big for these 03/20/17; patient is using Santyl and Anasept. Surfaces looked better today the area on the right is actually measuring a little smaller. She has states she has a lot of pain in her feet and ankles and is asking for a consult to pain control which I'll try to help her with through our case manager. 04/10/17; the patient arrives with better-looking wound surfaces and is slightly smaller wound on the left she is using a combination of Santyl and Anasept. She has an appointment or at least as started in the pain control center associated with [facility] 05/14/17; this is a patient who I followed for a prolonged period of time. 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 At one point I had this down to a much smaller wound on the left however these reopened and we've never been able to get these to heal. She has been using Santyl and Anasept gel although 2 weeks ago she ran out of the Anasept gel. She has a stable appearance of the wound. She is going to the wound care clinic at [facility]. They wanted do a nerve block/spinal block although she tells me she is reluctant to go forward with that. 05/21/17; this is a patient I have followed for many years. She has  venous insufficiency ulcers on her bilateral medial ankles. Chronic pain and deformity in her ankles as well. She is been to see plastic surgery as well as orthopedics. Using PolyMem AG most recently/Kerramax/ABDs and 2 layer compression. She has managed to keep this on and she is coming in for a nurse check to change the dressing on Tuesdays, we see her on Fridays 06/05/17; really quite a good looking surface and the area especially  on the right medial has contracted in terms of dimensions. Well granulated healthy-looking tissue on both sides. Even with an open curet there is nothing that even feels abnormal here. This is as good as I've seen this in quite some time. We have been using PolyMem AG and bringing her in for a nurse check 06/12/17; really quite good surface on both of these wounds. The right medial has contracted a bit left is not. We've been using PolyMem and AG and she is coming in for a nurse visit 06/19/17; we have been using PolyMem AG and bringing her in for a nurse check. Dimensions of her wounds are not better but the surfaces looked better bilaterally. She complained of bleeding last night and the left wound and increasing pain bilaterally. She states her wound pain is more neuropathic than just the wounds. There was some suggestion that this was radicular from her pain management doctor in talking to her it is really difficult to sort this out. 06/26/17; using PolyMem and AG and bringing her in for a nurse check as All of this and reasonably stable condition. Certainly not improved. The dimensions on the lateral part of the right leg look better but not really measuring better. The medial aspect on the left is about the same. 07/03/16; we have been using PolyMen AG and bringing her in for a nurse check to change the dressings as the wounds have drainage which precludes once weekly changing. We are using all secondary absorptive dressings.our intake nurse is brought up the idea of using a wound VAC/snap VAC on the wound to help with the drainage to see if this would result in some contraction. This is not a bad idea. The area on the right medial is actually looking smaller. Both wounds have a reasonable-looking surface. There is no evidence of cellulitis. The edema is well controlled 07/10/17; the patient was denied for a snap VAC by her insurance. The major issue with these wounds continues to be drainage. We are  using wicked PolyMem AG and she is coming in for a nurse visit to change this. The wounds are stable to slightly improved. The surface looks vibrant and the area on the right certainly has shrunk in size but very slowly 07/17/17; the patient still has large wounds on her bilateral medial malleoli. Surface of both of these wounds looks better. The dimensions seem to come and go but no consistent improvement. There is no epithelialization. We do not have options for advanced treatment products due to insurance issues. They did not approve of the wound VAC to help control the drainage. More recently we've been using PolyMem and AG wicked to allow drainage through. We have been bringing her in for a nurse visit to change this. We do not have a lot of options for wound care products and the home again due to insurance issues 07/24/17; the patient's wound actually looks somewhat better today. No drainage measurements are smaller still healthy-looking surface. We used silver collagen under PolyMen started last week. We have been bringing her  in for a dressing change 07/31/17; patient's wound surface continued to look better and I think there is visible change in the dimensions of the wound on the right. Rims of epithelialization. We have been using silver collagen under PolyMen and bringing her in for a dressing change. There appears to be less drainage although she is still in need of the dressing change 08/07/17. Patient's wound surface continues to look better on both sides and the area on the right is definitely smaller. We have been using silver collagen and PolyMen. She feels that the drainage has been it has been better. I asked her about her vascular status. She went to see Dr. Aleda Grana at [facility] and had some form of ablation. I don't have much detail on this. I haven't my notes from 2016 that she was not a candidate for any further ablation but I don't have any more information on this. We had  referred her to vein and vascular I don't think she ever went. He does not have a history of PAD although I don't have any information on this either. We don't even have ABIs in our record 08/14/17; we've been using silver collagen and PolyMen cover. And putting the patient and compression. She we are bringing her in as a nurse visit to change this because ofarge amount of drainage. We didn't the ABIs in clinic today since they had been done in many moons 1.2 bilaterally. She has been to see vein and vascular however this was at [facility] and she had ablation although I really don't have any information on this all seemed biking get a report. She is also been operatively debrided by plastic surgery and had a cell placed probably 8-12 months ago. This didn't have a major effect. We've been making some gains with current dressings 08/19/17-She is here in follow-up evaluation for bilateral medial malleoli ulcers. She continues to tolerate debridement very poorly. We will continue with recently changed topical treatment; if no significant improvement may consider switching to Iodosorb/Iodoflex. 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 She has been using silver collagen and PolyMem. I believe she has been used and tried on Iodoflex before. 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 These have been very refractory over time. We have been using silver collagen and PolyMen. She is coming in once a week for a doctor's and once a week for nurse visits. We are actually making some progress 09/18/17; the patient's wounds are smaller especially on the right medial. She arrives today to upset to consider even washing these off with Anasept which I think is been part of the  reason this is been closing. We've been using collagen covered in PolyMen otherwise. It is noted that she has a small area of folliculitis on the right medial calf that. As we are wrapping her legs I'll give her a short course of doxycycline to make sure this doesn't amount to anything. She is a long list of complaints today including imbalance, shortness of breath on exertion, inversion of her left ankle. With regards to the latter complaints she is been to see orthopedics and they offered her a tendon release surgery I believe but wanted her wounds to be closed first. I have recommended she go see her primary physician  with regards to everything else. 09/25/17; patient's wounds are about the same size. We have made some progress bilaterally although not in recent weeks. She will not allow me T wash these wounds with Anasept even if she is doing her cell. Wheeze we've been using collagen covered in PolyMen. Last week she had a small area of folliculitis this is now opened into a small wound. She completed 5 days of trimethoprim sulfamethoxazole 10/02/17; unfortunately the area on her left medial ankle is worse with a larger wound area towards the Achilles. The patient complains of a lot of pain. She will not allow debridement although visually I don't think there is anything to debridement in any case. We have been using silver collagen and PolyMen for several months now. Initially we are making some progress although I'm not really seeing that today. We will move back to [facility]. His admittedly this is a bit of a repeat however I'm hoping that his situation is different now. The patient tells me she had her leg on the left give out on her yesterday this is process some pain. 10/09/17; the patient is seen twice a week largely because of drainage issues coming out of the chronic medial bimalleolar wounds that are chronic. Last week the dimensions of the one on the left looks a little larger I  changed her to [facility]. She comes in today with a history of terrible pain in the bilateral wound areas. She will not allow debridement. She will not even allow a tissue culture. There is no surrounding erythema no no evidence of cellulitis. We have been putting her Kerlix Coban man. She will not allow more aggressive compression as there was a suggestion to put her in 3 layer wraps. 10/16/17; large wounds on her bilateral medial malleoli. These are chronic. Not much change from last week. The surface looks have healthy but absolutely no epithelialization. A lot of pain little less so of drainage. She will not allow debridement or even washing these off in the vigorous fashion with Anasept. 10/23/17; large wounds on her bilateral malleoli which are chronic. Some improvement in terms of size perhaps on the right since last time I saw these. She states that after we increased the 3 layer compression there was some bleeding, when she came in for a nurse visit she did not want 3 layer compression put back on about our nurse managed to convince her. She has known chronic venous visit issues and I'm hoping to get her to tolerate the 3 layer compression. using Hydrofera Blue 10/30/17; absolutely no change in the condition of either wound although we've had some improvement in dimensions on the right.. Attempted to put her in 3 layer compression she didn't tolerated she is back in 2 layer compression. We've been using Hydrofera Blue We looked over her past records. She had venous reflux studies in November 2016. There was no evidence of deep venous reflux on the right. Superficial vein did not show the greater saphenous vein at think this is been previously ablated the small saphenous vein was within normal limits. The left deep venous system showed no DVT the vessels were positive for deep venous reflux in the posterior tibial veins at the ankle. The greater saphenous vein was surgically absent small  saphenous vein was within normal limits. She went to vein and vascular at [facility]. I believe she had an ablation on the left greater saphenous vein. I'll update her reflux studies perhaps ever reviewed by vein  and vascular. We've made absolutely no progress in these wounds. Will also try to read and TheraSkins through her insurance 11/06/17; W the patient apparently has a 2 week follow-up with vein and vascular I like him to review the whole issue with regards to her previous vascular workup by Dr. Aleda Grana. We've really made no progress on these wounds in many months. She arrives today with less viable looking surface on the left medial ankle wound. This was apparently looking about the same on Tuesday when she was here for nurse visit. 11/13/17; deep tissue culture I did last time of the left lower leg showed multiple organisms without any predominating. In particular no Staphylococcus or group A strep were isolated. We sent her for venous reflux studies. She's had a previous left greater saphenous vein stripping and I think sclerotherapy of the right greater saphenous vein. She didn't really look at the lesser saphenous vein this both wounds are on the medial aspect. She has reflux in the common femoral vein and popliteal vein and an accessory vein on the right and the common femoral vein and popliteal vein on the left. I'm going to have her go to see vein and vascular just the look over things and see if anything else beside aggressive compression is indicated here. We have not been able to make any progress on these wounds in spite of the fact that the surface of the wounds is never look too bad. 11/20/17; no major change in the condition of the wounds. Patient reports a large amount of drainage. She has a lot of complaints of pain although enlisting her today I wonder if some of this at least his neuropathic rather than secondary to her wounds. She has an appointment with vein and vascular  on 12/30/17. The refractory nature of these wounds in my mind at least need vein and vascular to look over the wounds the recent reflux studies we did and her history to see if anything further can be done here. I also note her gait is deteriorated quite a bit. 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 She has a bilateral Trendelenburg gait. I wonder if this is neuropathic or perhaps multilevel radicular. 11/27/17; her wounds actually looks slightly better. Healthy-looking granulation tissue a scant amount of epithelialization. [place] healthcare will not pay for [employer]. They will play for tri layer Oasis and Dermagraft. This is not a diabetic ulcer. We'll try for the tri layer Oasis. She still complains of some drainage. She has a vein and vascular appointment on 12/30/17 12/04/17; the wounds visually look quite good. Healthy-looking granulation with some degree of epithelialization. We are still waiting for response to our request for trial to try layer Oasis. Her appointment with vascular to review venous and arterial issues isn't sold the end of July 7/31. Not allow debridement or even vigorous cleansing of the wound surface. 12/18/17; slightly smaller especially on the right. 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 We've been using Hydrofera Blue. We still are looking into triple layer Oasis through her insurance 01/08/18 on evaluation today patient's wound actually appears to be showing signs of good improvement at this point in time. She has been tolerating the dressing changes without complication. Fortunately there does not appear to be any evidence of infection at this point in time. We have been utilizing silver nitrate which does seem to be of benefit for her which is also good news. Overall I'm very happy with how things seem  to be both regards appearance as well as measurement. Patient did see Dr. Bridgett Larsson for evaluation on 12/30/17. In his assessment he  felt that stripping would not likely add much more than chronic compression to the patient's healing process. His recommendation was to follow-up in three months with Dr. Doren Custard if she hasn't healed in order to consider referral back to you and see vascular where she previously was in a trial and was able to get her wound to heal. I'll be see what she feels she when you staying compression and he reiterated this as well. 01/13/18 on evaluation today patient appears to actually be doing very well in regard to her bilateral medial malleolus ulcers. She seems to have tolerated the chemical cauterization with silver nitrate last week she did have some pain through that evening but fortunately states that I'll be see since it seems to be doing better she is overall pleased with the progress. 01/21/18; really quite a remarkable improvement since I've last seen these wounds. We started using silver nitrate specially on the islands of hyper granulation which for some reason her around the wound circumference. This is really done quite nicely. Primary dressing Hydrofera Blue under 4 layer compression. She seems to be able to hold out without a nurse rewrap. Follow-up in 1 week 01/28/18; we've continued the hydrofera blue but continued with chemical cauterization to the wound area that we started about a month ago for irregular hyper granulation. She is made almost stunning improvement in the overall wound dimensions. I was not really expecting this degree of improvement in these chronic wounds 02/05/18; we continue with Hydrofera Bluebut of also continued the aggressive chemical cauterization with silver nitrate. We made nice progress with the right greater than left wound. 02/12/18. We continued with Hydrofera Blue after aggressive chemical cauterization with silver nitrate. We appear to be making nice progress with both wound areas 02/19/2018; we continue with [facility] after washing the wounds vigorously  with Anasept spray and chemical cauterization with silver nitrate. We are making excellent progress. The area on the right's just about closed 02/26/2018. The area on the left medial ankle had too much necrotic debris today. I used a #5 curette we are able to get most of the soft. I continued with the silver nitrate to the much smaller wound on the right medial ankle she had a new area on her right lower pretibial area which she says was due to a role in her compression 03/05/2018; both wound areas look healthy. Not much change in dimensions from last week. I continue to use silver nitrate and Hydrofera Blue. The patient saw Dr. Doren Custard of vein and vascular. He felt she had venous stasis ulcers. He felt based on her previous arteriogram she should have adequate circulation for healing. Also she has deep venous reflux but really no significant correctable superficial venous reflux at this time. He felt we should continue with conservative management including leg elevation and compression 04/02/2018; since we last saw this woman about a month ago she had a fall apparently suffered a pelvic fracture. I did not look up the x-ray. Nevertheless because of pain she literally was bedbound for 2 weeks and had home health coming out to change the dressing. Somewhat predictably this is resulted in considerable improvement in both wound areas. The right is just about closed on the medial malleolus and the left is about half the size. 04/16/2018; both her wounds continue to go down in size. Using Hydrofera Blue. 05/07/18;  both her wounds appeared to be improving especially on the right where it is almost closed. We are using Hydrofera Blue 05/14/2018; slightly worse this week with larger wounds. Surface on the left medial not quite as good. We have been using Hydrofera Blue 05/21/18; again the wounds are slightly larger. Left medial malleolus slightly larger with eschar around the circumference. We have been using  Hydrofera Blue undergoing a wraps for a prolonged period of time. This got a lot better when she was more recumbent due to a fall and a back injury. I change the primary dressing the silver alginate today. She did not tolerate a 4 layer compression previously although I may need to bring this up with her next time 05/28/2018; area on the left medial malleolus again is slightly larger with more drainage. Area on the right is roughly unchanged. She has a small area of folliculitis on the right medial just on the lower calf. This does not look ominous. 06/03/2018 left medial malleolus slightly smaller in a better looking surface. We used silver nitrate on this last time with silver alginate. The area on the right appears slightly smaller 1/10; left medial malleolus slightly smaller. Small open area on the right. We used silver nitrate and silver alginate as of 2 weeks ago. We continue with the wound and compression. These got a lot better when she was off her feet 1/17; right medial malleolus wound is smaller. The left may be slightly smaller. Both surfaces look somewhat better. 1/24; both wounds are slightly smaller. Using silver alginate under Unna boots 1/31; both wounds appear smaller in fact the area on the right medial is just about closed. Surface eschar. We have been using silver alginate under Unna boots. The patient is less active now spends let much less time on her feet and I think this is contributed to the general improvement in the wound condition 2/7; both wounds appear smaller. I was hopeful the right medial would be closed however there there is still the same small open area. Slight amount of surface eschar on the left the dimensions are smaller there is eschar but the wound edges appear to be free. We have been using silver alginate under Unna boot's 2/14; both wounds once again measure smaller. Circumferential eschar on the left medial. We have been using silver alginate under Unna  boots with gradual improvement 2/21; the area on the right medial malleolus has healed. The area on the left is smaller. We have been using silver alginate and Unna boots. We can discharge wrapping the right leg she has 20/30 stockings at home she will need to protect the scar tissue in this area 2/28; the area on the right medial malleolus remains closed the patient has a compression stocking. The area on the left is smaller. We have been using silver alginate and Unna boots. 3/6 the area on the right medial ankle remains closed. Good edema control noted she is using her own compression stocking. The area on the left medial ankle is smaller. We have been managing this with silver alginate and Unna boots which we will continue today. 3/13; the area on the right medial ankle remains closed and I'm declaring it healed today. When necessary the left is about the same still a healthy-looking surface but no major change and wound area. No evidence of infection and using silver alginate under unna and generally making considerable improvement 3/27 the area on the right medial ankle remains closed the area on the left  is about the same as last week. Certainly not any worse we have been using silver alginate under an Unna boot 4/3; the area on the right medial ankle remains closed per the patient. We did not look at this wound. The wound on the left medial ankle is about the same surface looks healthy we have been using silver alginate under an Unna boot 4/10; area on the right medial ankle remains closed per the patient. We did not look at this wound. The wound on the left medial ankle is slightly larger. The patient complains that the [facility] caused burning pain all week. She also told us that she was a lot more active this week. Changed her back to silver alginate 4/17; right medial ankle still closed per the patient. Left medial ankle is slightly larger. Using silver alginate. She did not  tolerate Hydrofera Blue on this area 4/24; right medial ankle remains closed we have not look at this. The left medial ankle continues to get larger today by about a centimeter. We have been using silver alginate under Unna boots. She complains about 4 layer compression as an alternative. She has been up on her feet working on her garden 5/8; right medial ankle remains closed we did not look at this. The left medial ankle has increased in size about 100%. We have been using silver alginate under Unna boots. She noted increased pain this week and was not surprised that the wound is deteriorated 5/15; no major change in SA however much less erythema ( one week of doxy ocellulitis). 5/22-64 year old female returns at 1 week to the clinic for left medial ankle wound for which we have been using silver alginate under 3 layer compression She was placed on DOXY at last visit - the wound is wider at this visit. She is in 3 layer compression 5/29; change to [facility] last week. I had given her empiric doxycycline 2 weeks ago for a week. She is in 3 layer compression. She complains of a lot of pain and drainage on presentation today. 6/5; using Hydrofera Blue. I gave her doxycycline recently empirically for erythema and pain around the wound. Believe her cultures showed enterococcus which not would not have been well covered by doxycycline nevertheless the wound looks better and I don't feel specifically that the enterococcus needs to be covered. She has a new what looks like a wrap injury on her lateral left ankle. 6/12; she is using Hydrofera Blue. She has a new area on the left anterior lower tibial area. This was a wrap injury last week. 6/19; the patient is using Hydrofera Blue. She arrived with marked inflammation and erythema around the wound and tenderness. 12/01/18 on evaluation today patient appears to be doing a little bit better based on what I'm hearing from the standpoint of lassos evaluation  to this as far as the overall appearance of the wound is concerned. Then sometime substandard she typically sees Dr. Dellia Nims. Nonetheless overall very pleased with the progress that she's made up to this point. No fevers, chills, nausea, or vomiting noted at this time. 7/10; some improvement in the surface area. Aggressively debrided last week apparently. I went ahead with the debridement today although the patient does not tolerate this very well. We have been using Iodoflex. Still a fair amount of drainage 7/17; slightly smaller. Using Iodoflex. 7/24; no change from last week in terms of surface area. We have been using Iodoflex. Surface looks and continues to look somewhat better  7/31; surface area slightly smaller better looking surface. We have been using Iodoflex. This is under Unna boot compression 8/7-Patient presents at 1 week with Unna boot and Iodoflex, wound appears better 8/14-Patient presents at 1 week with Iodoflex, we use the Unna boot, wound appears to be stable better.Patient is getting Botox treatment for the inversion of the foot for tendon release, Next week 8/21; we are using Iodoflex. Unna boot. The wound is stable in terms of surface area. Under illumination there is some areas of the wound that appear to be either epithelialized or perhaps this is adherent slough at this point I was not really clear. It did not wipe off and I was reluctant to debride this today. 8/28; we are using Iodoflex in an Unna boot. Seems to be making good improvement. 9/4; using Iodoflex and wound is slightly smaller. 9/18; we are using Iodoflex with topical silver nitrate when she is here. The wound continues to be smaller 10/2; patient missed her appointment last week due to GI issues. She left and Iodoflex based dressing on for 2 weeks. Wound is about the same size about the size of a dime on the left medial lower 10/9 we have been using Iodoflex on the medial left ankle wound. She has a new  superficial probable wrap injury on the dorsal left ankle 10/16; we have been using Hydrofera Blue since last week. This is on the left medial ankle 10/23; we have been using Hydrofera Blue since 2 weeks ago. This is on the left medial ankle. Dimensions are better 11/6; using Hydrofera Blue. I think the wound is smaller but still not closed. Left medial ankle 11/13; we have been using Hydrofera Blue. Wound is certainly no smaller this week. Also the surface not as good. This is the remanent of a very large area on her left medial ankle. 11/20; using Sorbact since last week. Wound was about the same in terms of size although I was disappointed about the surface debris 12/11; 3-week follow-up. Patient was on vacation. Wound is measuring slightly larger we have been using Sorbact. 12/18; wound is about the same size however surface looks better last week after debridement. We have been using Sorbact under compression 1/15 wound is probably twice the size of last time increased in length nonviable surface. We have been using Sorbact. She was running a mild fever and missed her appointment last week 1/22; the wound is come down in size but under illumination still a very adherent debris we have been Hydrofera Blue that I changed her to last week 1/29; dimensions down slightly. We have been using Hydrofera Blue 2/19 dimensions are the same however there is rims of epithelialization under illumination. Therefore more the surface area may be epithelialized 2/26; the patient's wound actually measures smaller. The wound looks healthy. We have been using Hydrofera Blue. I had some thoughts about running Apligraf then I still may do that however this looks so much better this week we will delay that for now 3/5; the wound is small but about the same as last week. We have been using Hydrofera Blue. No debridement is required today. 3/19; the wound is about the size of a dime. Healthy looking wound even under  illumination. We have been using Hydrofera Blue. No mechanical debridement is necessary 3/26; not much change from last week although still looks very healthy. We have been using Hydrofera Blue under Unna boots Patient was offered an ankle fusion by podiatry but not until the wound  heals with a proceed with this. 4/9; the patient comes in today with her original wound on the medial ankle looking satisfactory however she has some uncontrolled swelling in the middle part of her leg with 2 new open areas superiorly just lateral to the tibia. I think this was probably a wrap issue. She said she felt uncomfortable during the week but did not call in. We have been using Hydrofera Blue 4/16; the wound on the medial ankle is about the same. She has innumerable small areas superior to this across her mid tibia. I think this is probably folliculitis. She is also been working in the yard doing a lot of sweating 4/30; the patient issue on the upper areas across her mid tibia of all healed. I think this was excessive yard work if I remember. Her wound on the medial ankle is smaller. Some debris on this we have been using Hydrofera Blue under Unna boots 5/7; mid tibia. She has been using Hydrofera Blue under an Unna wrap. She is apparently going for her ankle surgery on June 3 10/28/19-Patient returns to clinic with the ankle wound, we are using Hydrofera Blue under Unna wrap, surgery is scheduled for her left foot for June 3 so she will be back for nurse visit next week READMISSION 01/17/2020 Mrs. Bobak is a 64 year old woman we have had in this clinic for a long period of time with severe venous hypertension and refractory wounds on her medial lower legs and ankles bilaterally. 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 When she was off her legs for a prolonged period example when she fell  and suffered a compression fracture things would heal up quite nicely. She is now retired and we managed to heal up the right medial leg wound. The left one was very tiny last time I saw this although still refractory. 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 It got to the point where this was interfering with her walking and she elected to proceed with a ankle arthrodesis to straighten her her ankle and leave her with a functional outcome for mobilization. The patient was referred to Dr. Doren Custard and really this took some time to arrange. Dr. Doren Custard saw her on 12/07/2019. Once again he verified that she had no arterial issues. She had previously had an angiogram several years ago. Follow-up ABIs on the left showed an ABI of 1.12 with triphasic waveforms and a TBI of 0.92. She is felt to have chronic deep venous insufficiency but I do not think it was felt that anything could be done from about this from an ablation point of view. At the time Dr. Doren Custard saw this patient the wounds actually look closed via the pictures in his clinic. The patient finally underwent her surgery on 12/15/2019. This went reasonably well and there was a good anatomic outcome. She developed a small distal wound dehiscence on the lateral part of the surgical wound. However more problematically she is developed recurrence of the wound on the medial left ankle. There are actually 2 wounds here one in the distal lower leg and 1 pretty much at the level of the medial malleolus. It is a more distal area that is more problematic. She has been using Hydrofera Blue which started on Friday before this  she was simply Ace wrapping. There was a culture done that showed Pseudomonas and she is on ciprofloxacin. A recent CNS on 8/11 was negative. The patient reports some pain but I generally think this is improving. She is using a cam boot completely  nonweightbearing using a walker for pivot transfers and a wheelchair Electronic Signature(s) Signed: 01/17/2020 6:57:29 PM By: Linton Ham MD Entered By: Linton Ham on 01/17/2020 11:27:34 -------------------------------------------------------------------------------- Physical Exam Details Patient Name: Date of Service: [name], [name] 01/17/2020 9:00 A M Medical Record Number: [id number] Patient Account Number: [id number] Date of Birth/Sex: Treating RN: 1956-02-11 (64 y.o. [name] Primary Care Provider: [name] Other Clinician: Referring Provider: Treating Provider/Extender: [name] in Treatment: 0 Constitutional Sitting or standing Blood Pressure is within target range for patient.. Pulse regular and within target range for patient.Marland Kitchen Respirations regular, non-labored and within target range.. Temperature is normal and within the target range for the patient.Marland Kitchen Appears in no distress. Respiratory work of breathing is normal. Cardiovascular Pedal pulses are palpable on the left foot and left popliteal. Integumentary (Hair, Skin) No erythema around either wound area. Notes Wound exam More problematically the area on the left medial lower leg and ankle is her major concern. There are actually 2 wound areas the more superior 1 appears to be epithelializing. There is some hyper granulation but nothing required debridement here. Distally the wound does not look too bad except for any its most inferior portion. A necrotic area which I think is probably partially tendon surrounded by debris which is adherent. I used a #5 curette to remove some of this including the tendon. I do not know that it tendon here is a problem the ankle is fused no tendon is going to be really functional. Hemostasis with direct pressure. I think this area is going to be problematic the rest of her wound areas do not look too bad On the left lateral she has a small  open area distally in the surgical site there is some depth here but this looks clean Electronic Signature(s) Signed: 01/17/2020 6:57:29 PM By: Linton Ham MD Entered By: Linton Ham on 01/17/2020 11:30:13 -------------------------------------------------------------------------------- Physician Orders Details Patient Name: Date of Service: [name], [name] 01/17/2020 9:00 A M Medical Record Number: [id number] Patient Account Number: [id number] Date of Birth/Sex: Treating RN: February 04, 1956 (64 y.o. [name] Primary Care Provider: [name] Other Clinician: Referring Provider: Treating Provider/Extender: [name] in Treatment: 0 Verbal / Phone Orders: No Diagnosis Coding Follow-up Appointments Return Appointment in 1 week. Nurse Visit: - Friday for rewrap Dressing Change Frequency Do not change entire dressing for one week. - all wounds Skin Barriers/Peri-Wound Care Moisturizing lotion Wound Cleansing May shower with protection. - use cast protector Primary Wound Dressing Wound #14 Left,Lateral Ankle Hydrofera Blue - Classic Wound #15 Left,Medial Malleolus Hydrofera Blue - Classic Wound #16 Left,Medial Lower Leg Hydrofera Blue - Classic Secondary Dressing Wound #14 Left,Lateral Ankle Dry Gauze Wound #15 Left,Medial Malleolus Dry Gauze ABD pad Wound #16 Left,Medial Lower Leg Dry Gauze ABD pad Edema Control Unna Boot to Left Lower Extremity Avoid standing for long periods of time Elevate legs to the level of the heart or above for 30 minutes daily and/or when sitting, a frequency of: - throughout the day Off-Loading Removable cast walker boot to: - left foot Electronic Signature(s) Signed: 01/17/2020 6:57:29 PM By: Linton Ham MD Signed: 01/19/2020 5:36:57 PM By: Levan Hurst RN, BSN  Entered By: Levan Hurst on 01/17/2020  10:59:45 -------------------------------------------------------------------------------- Problem List Details Patient Name: Date of Service: [facility] [name], [name] 01/17/2020 9:00 A M Medical Record Number: [id number] Patient Account Number: [id number] Date of Birth/Sex: Treating RN: July 08, 1955 (64 y.o. 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 01/17/2020 9:00 A M Medical Record Number: [id number] Patient Account Number: [id number] Date of Birth/Sex: Treating RN: May 08, 1956 (64 y.o. [name] Primary Care Provider: [name] Other Clinician: Referring Provider: Treating Provider/Extender: [name] in Treatment: 0 Subjective Chief Complaint Information obtained from Patient patient is been followed long-term in this clinic for venous insufficiency ulcers with inflammation, hypertension and ulceration over the medial ankle bilaterally. 01/17/2020; this is a patient who is here for  review of postoperative wounds on the left lateral ankle and recurrence of venous stasis ulceration on the left medial History of Present Illness (HPI) the remaining wound is over the left medial ankle. Similar wound over the right medial ankle healed largely with use of Apligraf. Most recently we have been using Hydrofera Blue over this wound with considerable improvement. The patient has been extensively worked up in the past for her venous insufficiency and she is not a candidate for antireflux surgery although I have none of the details available currently. 08/24/14; considerable improvement today. About 50% of this wound areas now epithelialized. The base of the wound appears to be healthier granulation.as opposed to last week when she had deteriorated a considerable improvement 08/17/14; unfortunately the wound has regressed somewhat. The areas of epithelialization from the superior aspect are not nearly as healthy as they were last week. The patient thinks her Hydrofera Blue slipped. 09/07/14; unfortunately the area has markedly regressed in the 2 weeks since I've seen this. There is an odor surrounding erythema. The healthy granulation tissue that we had at the base of the wound now is a dusky color. The nurse reports green drainage 09/14/14; the area looks somewhat better than last week. There is less erythema and less drainage. The culture I did did not show any growth. Nevertheless I think it is better to continue the Cipro and doxycycline for a further week. The remaining wound area was debridement. 09/21/14. Wound did not require debridement last week. Still less erythema and less drainage. She can complete her antibiotics. The areas of epithelialization in the superior aspect of the wound do not look as healthy as they did some weeks ago 10/05/14 continued improvement in the condition of this wound. There is advancing epithelialization. Less aggressive debridement required 10/19/14 continued  improvement in the condition and volume of this wound. Less aggressive debridement to the inferior part of this to remove surface slough and fibrinous eschar 11/02/14 no debridement is required. The surface granulation appears healthy although  some of her islands of epithelialization seem to have regressed. No evidence of infection 11/16/14; lites surface debridement done of surface eschar. The wound does not look to be unhealthy. No evidence of infection. Unfortunately the patient has had podiatry issues in the right foot and for some reason has redeveloped small surface ulcerations in the medial right ankle. Her original presentation involved wounds in this area 11/23/14 no debridement. The area on the right ankle has enlarged. The left ankle wound appears stable in terms of the surface although there is periwound inflammation. There has been regression in the amount of new skin 11/30/14 no debridement. Both wound areas appear healthy. There was no evidence of infection. The the new area on the right medial ankle has enlarged although that both the surfaces appear to be stable. 12/07/14; Debridement of the right medial ankle wound. No no debridement was done on the left. 12/14/14 no major change in and now bilateral medial ankle wounds. Both of these are very painful but the no overt evidence of infection. She has had previous venous ablation 12/21/14; patient states that her right medial ankle wound is considerably more painful last week than usual. Her left is also somewhat painful. She could not tolerate debridement. The right medial ankle wound has fibrinous surface eschar 12/28/14 this is a patient with severe bilateral venous insufficiency ulcers. For a considerable period of time we actually had the one on the right medial ankle healed however this recently opened up again in June. The left medial ankle wound has been a refractory area with some absent flows. We had some success with Hydrofera Blue  on this area and it literally closed by 50% however it is recently opened up Foley. Both of these were debridement today of surface eschar. She tolerates this poorly 01/25/15: No change in the status of this. Thick adherent escar. Very poor tolerance of any attempt at debridement. I had healed the right medial malleolus wound for a considerable amount of time and had the left one down to about 50% of the volume although this is totally regressed over the last 48 weeks. Further the right leg has reopened. she is trying to make a appointment with pain and vascular, previous ablations with Dr. Aleda Grana. I do not believe there is an arterial insufficiency issue here 02/01/15 the status of the adherent eschar bilaterally is actually improved. No debridement was done. She did not manage to get vascular studies done 02/08/15 continued debridement of the area was done today. The slough is less adherent and comes off with less pressure. There is no surrounding infection peripheral pulses are intact 02/15/15 selective debridement with a disposable curette. Again the slough is less adherent and comes off with less difficulty. No surrounding infection peripheral pulses are intact. 02/22/15 selective debridement of the right medial ankle wound. Slough comes off with less difficulty. No obvious surrounding infection peripheral pulses are intact I did not debridement the one on the left. Both of these are stable to improved 03/01/15 selective debridement of both wound areas using a curette to. Adherent slough cup soft with less difficulty. No obvious surrounding infection. The patient tells me that 2 days ago she noted a rash above the right leg wrap. She did not have this on her lower legs when she change this over she arrives with widespread left greater than right almost folliculitis-looking rash which is extremely pruritic. I don't see anything to culture here. There is no rash on the rest of her body. She  feels  well systemically. 03/08/15; selective debridement of both wounds using a curette. Base of this does not look unhealthy. She had limegreen drainage coming out of the left leg wound and describes a lot of drainage. The rash on her left leg looks improved to. No cultures were done. 03/22/15; patient was not here last week. Basal wounds does not look healthy and there is no surrounding erythema. No drainage. There is still a rash on the left leg that almost looks vasculitic however it is clearly limited to the top of where the wrap would be. 04/05/15; on the right required a surgical debridement of surface eschar and necrotic subcutaneous tissue. I did not debridement the area on the left. These continue to be large open wounds that are not changing that much. We were successful at one point in healing the area on the right, and at the same time the area on the left was roughly half the size of current measurements. I think a lot of the deterioration has to do with the prolonged time the patient is on her feet at work 04/19/15 I attempted-like surface debridement bilaterally she does not tolerate this. She tells me that she was in allergic care yesterday with extreme pain over her left lateral malleolus/ankle and was told that she has an "sprain" 05/03/15; large bilateral venous insufficiency wounds over the medial malleolus/medial aspect of her ankles. She complains of copious amounts of drainage and his usual large amounts of pain. There is some increasing erythema around the wound on the right extending into the medial aspect of her foot to. historically she came in with these wounds the right one healed and the left one came down to roughly half its current size however the right one is reopened and the left is expanded. 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 05/10/15 large bilateral venous insufficiency wounds. There is less adherence surface left  however the surface culture that I did last week grew pseudomonas therefore bilateral selective debridement score necessary. There is surrounding erythema. The patient describes severe bilateral drainage and a lot of pain in the left ankle. Apparently her podiatrist was were ready to do a cortisone shot 05/17/15; the patient complains of pain and again copious amounts of drainage. 05/24/15; we used Iodo flex last week. Patient notes considerable improvement in wound drainage. Only needed to change this once. 05/31/15; we continued Iodoflex; the base of these large wounds bilaterally is not too bad but there is probably likely a significant bioburden here. I would like to debridement just doesn't tolerate it. 06/06/14 I would like to continue the Iodoflex although she still hasn't managed to obtain supplies. She has bilateral medial malleoli or large wounds which are mostly superficial. Both of them are covered circumferentially with some nonviable fibrinous slough although she tolerates debridement very poorly. She apparently has an appointment for an ablation on the right leg by interventional radiology. 06/14/15; the patient arrives with the wounds and static condition. We attempted a debridement although she does not do well with this secondary to pain. I 07/05/15; wounds are not much smaller however there appears to be a cleaner granulating base. The left has tight fibrinous slough greater than the right. Debridement is tolerated poorly due to pain. Iodoflex is done more for these wounds in any of the multitude of different dressings I have tried on the left 1 and then subsequently the right. 07/12/15; no  change in the condition of this wound. I am able to do an aggressive debridement on the right but not the left. She simply cannot tolerate it. We have been using Iodoflex which helps somewhat. It is worthwhile remembering that at one point we healed the right medial ankle wound and the left was  about 25% of the current circumference. We have suggested returning to vascular surgery for review of possible further ablations for one reason or another she has not been able to do this. 07/26/15 no major change in the condition of either wound on her medial ankle. I did not attempt to debridement of these. She has been aggressively scrubbing these while she is in the shower at home. She has her supply of Iodoflex which seems to have done more for these wounds then anything I have put on recently. 08/09/15 wound area appears larger although not verified by measurements. Using Iodoflex 09/05/2015 -- she was here for avisit today but had significant problems with the wound and I was asked to see her for a physician opinion. I have summarize that this lady has had surgery on her left lower extremity about 10 years ago where the possible veins stripping was done. She has had an opinion from interventional radiology around November 2016 where no further sclerotherapy was ordered. The patient works 12 hours a day and stands on a concrete floor with work boots and is unable to get the proper compression she requires and cannot elevate her limbs appropriately at any given time. She has recently grown Pseudomonas from her wound culture but has not started her ciprofloxacin which was called in for her. 09/13/15 this continues to be a difficult situation for this patient. At one point I had this wound down to a 1.5 x 1.5" wound on her left leg. This is deteriorated and the right leg has reopened. She now has substantial wounds on her medial calcaneus, malleoli and into her lower leg. One on the left has surface eschar but these are far too painful for me to debridement here. She has a vascular surgery appointment next week to see if anything can be done to help here. I think she has had previous ablations several years ago at [facility]. She has no major edema. She tells me that she did not get product last  time [facility] Ag] and went for several days without it. She continues to work in work boots 12 hours a day. She cannot get compression/4-layer under her work boots. 09/20/15 no major change. Periwound edema control was not very good. Her point with pain and vascular is next Wednesday the 25th 09/28/15; the patient is seen vascular surgery and is apparently scheduled for repeat duplex ultrasounds of her bilateral lower legs next week. 10/05/15; the patient was seen by Dr. Doren Custard of vascular surgery. He feels that she should have arterial insufficiency excluded as cause/contributed to her nonhealing stage she is therefore booked for an arteriogram. She has apparently monophasic signals in the dorsalis pedis pulses. She also of course has known severe chronic venous insufficiency with previous procedures as noted previously. I had another long discussion with the patient today about her continuing to work 12 hour shifts. I've written her out for 2 months area had concerns about this as her work location is currently undergoing significant turmoil and this may lead to her termination. She is aware of this however I agree with her that she simply cannot continue to stand for 12 hours multiple days a  week with the substantial wound areas she has. 10/19/15; the Dr. Doren Custard appointment was largely for an arteriogram which was normal. She does not have an arterial issue. He didn't make a comment about her chronic venous insufficiency for which she has had previous ablations. Presumably it was not felt that anything additional could be done. The patient is now out of work as I prescribed 2 weeks ago. Her wounds look somewhat less aggravated presumably because of this. I felt I would give debridement another try today 10/25/15; no major change in this patient's wounds. We are struggling to get her product that she can afford into her own home through her insurance. 11/01/15; no major change in the patient's wounds. I have  been using silver alginate as the most affordable product. I spoke to Dr. Marla Roe last week with her requested take her to the OR for surgical debridement and placement of ACEL. Dr. Marla Roe told me that she would be willing to do this however [facility] will not cover this, fortunately the patient has [place] healthcare of some variant 11/08/15; no major change in the patient's wounds. She has been completely nonviable surface that this but is in too much pain with any attempted debridement are clinic. I have arranged for her to see Dr. Marla Roe ham of plastic surgery and this appointment is on Monday. I am hopeful that they will take her to the OR for debridement, possible ACEL ultimately possible skin graft 11/22/15 no major change in the patient's wounds over her bilateral medial calcaneus medial malleolus into the lower legs. Surface on these does not look too bad however on the left there is surrounding erythema and tenderness. This may be cellulitis or could him sleepy tinea. 11/29/15; no major changes in the patient's wounds over her bilateral medial malleolus. There is no infection here and I don't think any additional antibiotics are necessary. There is now plan to move forward. She sees Dr. Marla Roe in a week's time for preparation for operative debridement and ACEL placement I believe on 7/12. She then has a follow-up appointment with Dr. Marla Roe on 7/21 12/28/15; the patient returns today having been taken to the [facility] by Dr. Marla Roe 12/12/15 she underwent debridement, intraoperative cultures [which were negative]. She had placement of a wound VAC. Parent really ACEL was not available to be placed. The wound VAC foam apparently adhered to the wound since then she's been using silver alginate, Xeroform under Ace wraps. She still says there is a lot of drainage and a lot of pain 01/31/16; this is a patient I see monthly. I had referred her to Dr. Marla Roe him of plastic  surgery for large wounds on her bilateral medial ankles. She has been to the OR twice once in early July and once in early August. She tells me over the last 3 weeks she has been using the wound VAC with ACEL underneath it. On the right we've simply been using silver alginate. Under Kerlix Coban wraps. 02/28/16; this is a patient I'm currently seeing monthly. She is gone on to have a skin graft over her large venous insufficiency ulcer on the left medial ankle. This was done by Dr. Marla Roe him. The patient is a bit perturbed about why she didn't have one on her right medial ankle wound. She has been using silver alginate to this. 03/06/16; I received a phone call from her plastic surgery Dr. Marla Roe. She expressed some concern about the viability of the skin graft she did on  the left medial ankle wound. Asked me to place Endoform on this. She told me she is not planning to do a subsequent skin graft on the right as the left one did not take very well. I had placed Hydrofera Blue on the right 03/13/16; continue to have a reasonably healthy wound on the right medial ankle. Down to 3 mm in terms of size. There is epithelialization here. The area on the left medial ankle is her skin graft site. I suppose the last week this looks somewhat better. She has an open area inferiorly however in the center there appears to be some viable tissue. There is a lot of surface callus and eschar that will eventually need to come off however none of this looked to be infected. Patient states that the is able to keep the dressing on for several days which is an improvement. 03/20/16 no major change in the circumference of either wound however on the left side the patient was at Dr. Eusebio Friendly office and they did a debridement of left wound. 50% of the wound seems to be epithelialized. I been using Endoform on the left Hydrofera Blue in the right 03/27/16; she arrives today with her wound is not looking as healthy as  they did last week. The area on the right clearly has an adherent surface to this a very similar surface on the left. Unfortunately for this patient this is all too familiar problem. Clearly the Endoform is not working and will need to change that today that has some potential to help this surface. She does not tolerate debridement in this clinic very well. She is changing the dressing wants 04/03/16; patient arrives with the wounds looking somewhat better especially on the right. Dr. Migdalia Dk change the dressing to silver alginate when she saw her on Monday and also sold her some compression socks. The usefulness of the latter is really not clear and woman with severely draining wounds. 04/10/16; the patient is doing a bit of an experiment wearing the compression stockings that Dr. Migdalia Dk provided her to her left leg and the out of legs based dressings that we provided to the right. 05/01/16; the patient is continuing to wear compression stockings Dr. Migdalia Dk provided her on the left that are apparently silver impregnated. She has been using Iodoflex to the right leg wound. Still a moderate amount of drainage, when she leaves here the wraps only last for 4 days. She has to change the stocking on the left leg every night 05/15/16; she is now using compression stockings bilaterally provided by Dr. Marla Roe. She is wearing a nonadherent layer over the wounds so really I don't think there is anything specific being done to this now. She has some reduction on the left wound. The right is stable. I think all healing here is being done without a specific dressing 06/09/16; patient arrives here today with not much change in the wound certainly in diameter to large circular wounds over the medial aspect of her ankle bilaterally. Under the light of these services are certainly not viable for healing. There is no evidence of surrounding infection. She is wearing compression stockings with some sort of silver  impregnation as prescribed by Dr. Marla Roe. She has a follow-up with her tomorrow. 06/30/16; no major change in the size or condition of her wounds. These are still probably covered with a nonviable surface. She is using only her purchase stockings. She did see Dr. Marla Roe who seemed to want to apply Dakin's solution to  this I'm not extreme short what value this would be. I would suggest Iodoflex which she still has at home. 07/28/16; I follow Mrs. Bitting episodically along with Dr. Marla Roe. 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 She has been applying a topical collagen ointment to both wounds with Adaptic. I don't think Dr. Marla Roe is planning to take her back to the OR. 08/19/16; I follow Mrs. Jeneen Rinks on a monthly basis along with Dr. Marla Roe of plastic surgery. 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 I been following her for a number of years. At one point I was able to get the right medial malleolus wound to heal and had the left medial malleolus down to about half its current size however and I had to send her to plastic surgery for an operative debridement. Since then things have been stable to slightly improve the area on the right is slightly better one in the left about the same although there is much less adherent surface than I'm used to with this patient. She is using some form of liquid collagen gel that Dr. Marla Roe provided a Kerlix cover with the patient's own pressure stockings. She tells me that she has extreme pain in both ankles and along the lateral aspect of both feet. She has been unable to work for some period of time. She is telling me she is retiring at the beginning of April. She sees Dr. Doran Durand of orthopedics next week 09/22/16; patient has not seen Dr. Marla Roe since the last time she is here. I'm not really sure what she is  using to the wounds other than bits and pieces of think she had left over including most recently Hydrofera Blue. She is using juxtalite stockings. She is having difficulty with her husband's recent illness "stroke". She is having to transport him to various doctors appointments. Dr. Marla Roe left her the option of a repeat debridement with ACEL however she has not been able to get the time to follow-up on this. She continues to have a fair amount of drainage out of these wounds with certainly precludes leaving dressings on all week 10/13/16; patient has not seen Dr. Marla Roe since she was last in our clinic. I'm not really sure what she is doing with the wounds, we did try to get her [facility] and I think she is actually using this most of the time. Because of drainage she states she has to change this every second day although this is an improvement from what she used to do. She went to see Dr. Doran Durand who did not think she had a muscular issue with regards to her feet, he referred her to a neurologist and I think the appointment is sometime in June. I changed her back to Iodoflex which she has used in the past but not recently. 11/03/16; the patient has been using Iodoflex although she ran out of this. Still claims that there is a lot of drainage although the wound does not look like this. No surrounding erythema. She has not been back to see Dr. Marla Roe 11/24/16; the patient has been using Iodoflex again but she ran out of it 2 or 3 days ago. There is no major change in the condition of either one of these wounds in fact they are larger and covered in a thick adherent surface slough/nonviable tissue especially on the left. She does not tolerate mechanical debridement  in our clinic. Going back to see Dr. Marla Roe of plastic surgery for an operative debridement would seem reasonable. 12/15/16; the patient has not been back to see Dr. Marla Roe. She is been dealing with a series of illnesses  and her husband which of monopolized her time. She is been using Sorbact which we largely supplied. She states the drainage is bad enough that it maximum she can go 2-3 days without changing the dressing 01/12/2017 -- the patient has not been back for about 4 weeks and has not seen Dr. Marla Roe not does she have any appointment pending. 01/23/17; patient has not seen Dr. Marla Roe even though I suggested this previously. She is using Santyl that was suggested last week by Dr. Con Memos this Cost her $16 through her insurance which is indeed surprising 02/12/17; continuing Santyl and the patient is changing this daily. A lot of drainage. She has not been back to see plastic surgery she is using an Ace wrap. Our intake nurse suggested wrap around stockings which would make a good reasonable alternative 02/26/17; patient is been using Santyl and changing this daily due to drainage. She has not been to see plastic surgery she uses in April Ace wrap to control the edema. She did obtain extremitease stockings but stated that the edema in her leg was to big for these 03/20/17; patient is using Santyl and Anasept. Surfaces looked better today the area on the right is actually measuring a little smaller. She has states she has a lot of pain in her feet and ankles and is asking for a consult to pain control which I'll try to help her with through our case manager. 04/10/17; the patient arrives with better-looking wound surfaces and is slightly smaller wound on the left she is using a combination of Santyl and Anasept. She has an appointment or at least as started in the pain control center associated with [facility] 05/14/17; this is a patient who I followed for a prolonged period of time. 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 At one point I had this down to a much smaller wound on the left however these reopened and we've never been able to get these to heal. She has been using  Santyl and Anasept gel although 2 weeks ago she ran out of the Anasept gel. She has a stable appearance of the wound. She is going to the wound care clinic at [facility]. They wanted do a nerve block/spinal block although she tells me she is reluctant to go forward with that. 05/21/17; this is a patient I have followed for many years. 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 Chronic pain and deformity in her ankles as well. She is been to see plastic surgery as well as orthopedics. Using PolyMem AG most recently/Kerramax/ABDs and 2 layer compression. She has managed to keep this on and she is coming in for a nurse check to change the dressing on Tuesdays, we see her on Fridays 06/05/17; really quite a good looking surface and the area especially on the right medial has contracted in terms of dimensions. Well granulated healthy-looking tissue on both sides. Even with an open curet there is nothing that even feels abnormal here. This is as good as I've seen this in quite some time. We have been using PolyMem AG and bringing her in for a nurse check 06/12/17; really quite good surface on both of these wounds. The right medial has contracted a bit left  is not. We've been using PolyMem and AG and she is coming in for a nurse visit 06/19/17; we have been using PolyMem AG and bringing her in for a nurse check. Dimensions of her wounds are not better but the surfaces looked better bilaterally. She complained of bleeding last night and the left wound and increasing pain bilaterally. She states her wound pain is more neuropathic than just the wounds. There was some suggestion that this was radicular from her pain management doctor in talking to her it is really difficult to sort this out. 06/26/17; using PolyMem and AG and bringing her in for a nurse check as All of this and reasonably stable condition. Certainly not improved. The dimensions on the lateral part of the right leg look  better but not really measuring better. The medial aspect on the left is about the same. 07/03/16; we have been using PolyMen AG and bringing her in for a nurse check to change the dressings as the wounds have drainage which precludes once weekly changing. We are using all secondary absorptive dressings.our intake nurse is brought up the idea of using a wound VAC/snap VAC on the wound to help with the drainage to see if this would result in some contraction. This is not a bad idea. The area on the right medial is actually looking smaller. Both wounds have a reasonable-looking surface. There is no evidence of cellulitis. The edema is well controlled 07/10/17; the patient was denied for a snap VAC by her insurance. The major issue with these wounds continues to be drainage. We are using wicked PolyMem AG and she is coming in for a nurse visit to change this. The wounds are stable to slightly improved. The surface looks vibrant and the area on the right certainly has shrunk in size but very slowly 07/17/17; the patient still has large wounds on her bilateral medial malleoli. Surface of both of these wounds looks better. The dimensions seem to come and go but no consistent improvement. There is no epithelialization. We do not have options for advanced treatment products due to insurance issues. They did not approve of the wound VAC to help control the drainage. More recently we've been using PolyMem and AG wicked to allow drainage through. We have been bringing her in for a nurse visit to change this. We do not have a lot of options for wound care products and the home again due to insurance issues 07/24/17; the patient's wound actually looks somewhat better today. No drainage measurements are smaller still healthy-looking surface. We used silver collagen under PolyMen started last week. We have been bringing her in for a dressing change 07/31/17; patient's wound surface continued to look better and I think there  is visible change in the dimensions of the wound on the right. Rims of epithelialization. We have been using silver collagen under PolyMen and bringing her in for a dressing change. There appears to be less drainage although she is still in need of the dressing change 08/07/17. Patient's wound surface continues to look better on both sides and the area on the right is definitely smaller. We have been using silver collagen and PolyMen. She feels that the drainage has been it has been better. I asked her about her vascular status. She went to see Dr. Aleda Grana at [facility] and had some form of ablation. I don't have much detail on this. I haven't my notes from 2016 that she was not a candidate for any further ablation but  I don't have any more information on this. We had referred her to vein and vascular I don't think she ever went. He does not have a history of PAD although I don't have any information on this either. We don't even have ABIs in our record 08/14/17; we've been using silver collagen and PolyMen cover. And putting the patient and compression. She we are bringing her in as a nurse visit to change this because ofarge amount of drainage. We didn't the ABIs in clinic today since they had been done in many moons 1.2 bilaterally. She has been to see vein and vascular however this was at [facility] and she had ablation although I really don't have any information on this all seemed biking get a report. She is also been operatively debrided by plastic surgery and had a cell placed probably 8-12 months ago. This didn't have a major effect. We've been making some gains with current dressings 08/19/17-She is here in follow-up evaluation for bilateral medial malleoli ulcers. She continues to tolerate debridement very poorly. We will continue with recently changed topical treatment; if no significant improvement may consider switching to Iodosorb/Iodoflex. 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 She has been using silver collagen and PolyMem. I believe she has been used and tried on Iodoflex before. 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 These have been very refractory over time. We have been using silver collagen and PolyMen. She is coming in once a week for a doctor's and once a week for nurse visits. We are actually making some progress 09/18/17; the patient's wounds are smaller especially on the right medial. She arrives today to upset to consider even washing these off with Anasept which I think is been part of the reason this is been closing. We've been using collagen covered in PolyMen otherwise. It is noted that she has a small area of folliculitis on the right medial calf that. As we are wrapping her legs I'll give her a short course of doxycycline to make sure this doesn't amount to anything. She is a long list of complaints today including imbalance, shortness of breath on exertion, inversion of her left ankle. With regards to the latter complaints she is been to see orthopedics and they offered her a tendon release surgery I believe but wanted her wounds to be closed first. I have recommended she go see her primary physician with regards to everything else. 09/25/17; patient's wounds are about the same size. We have made some progress bilaterally although not in recent weeks. She will not allow me T wash these wounds with Anasept even if she is doing her cell. Wheeze we've been using collagen covered in PolyMen. Last week she had a small area of folliculitis this is now opened into a small wound. She completed 5 days of trimethoprim sulfamethoxazole 10/02/17; unfortunately the area on her left medial ankle is worse with a larger wound area  towards the Achilles. The patient complains of a lot of pain. She will not allow debridement although visually I don't think there is anything to debridement in any case. We have been using silver collagen and PolyMen for several months now. Initially we are making some progress although I'm not really seeing that today. We will move back to  Hydrofera Blue. His admittedly this is a bit of a repeat however I'm hoping that his situation is different now. The patient tells me she had her leg on the left give out on her yesterday this is process some pain. 10/09/17; the patient is seen twice a week largely because of drainage issues coming out of the chronic medial bimalleolar wounds that are chronic. Last week the dimensions of the one on the left looks a little larger I changed her to [facility]. She comes in today with a history of terrible pain in the bilateral wound areas. She will not allow debridement. She will not even allow a tissue culture. There is no surrounding erythema no no evidence of cellulitis. We have been putting her Kerlix Coban man. She will not allow more aggressive compression as there was a suggestion to put her in 3 layer wraps. 10/16/17; large wounds on her bilateral medial malleoli. These are chronic. Not much change from last week. The surface looks have healthy but absolutely no epithelialization. A lot of pain little less so of drainage. She will not allow debridement or even washing these off in the vigorous fashion with Anasept. 10/23/17; large wounds on her bilateral malleoli which are chronic. Some improvement in terms of size perhaps on the right since last time I saw these. She states that after we increased the 3 layer compression there was some bleeding, when she came in for a nurse visit she did not want 3 layer compression put back on about our nurse managed to convince her. She has known chronic venous visit issues and I'm hoping to get her to tolerate the 3 layer  compression. using Hydrofera Blue 10/30/17; absolutely no change in the condition of either wound although we've had some improvement in dimensions on the right.. Attempted to put her in 3 layer compression she didn't tolerated she is back in 2 layer compression. We've been using Hydrofera Blue We looked over her past records. She had venous reflux studies in November 2016. There was no evidence of deep venous reflux on the right. Superficial vein did not show the greater saphenous vein at think this is been previously ablated the small saphenous vein was within normal limits. The left deep venous system showed no DVT the vessels were positive for deep venous reflux in the posterior tibial veins at the ankle. The greater saphenous vein was surgically absent small saphenous vein was within normal limits. She went to vein and vascular at [facility]. I believe she had an ablation on the left greater saphenous vein. I'll update her reflux studies perhaps ever reviewed by vein and vascular. We've made absolutely no progress in these wounds. Will also try to read and TheraSkins through her insurance 11/06/17; W the patient apparently has a 2 week follow-up with vein and vascular I like him to review the whole issue with regards to her previous vascular workup by Dr. Aleda Grana. We've really made no progress on these wounds in many months. She arrives today with less viable looking surface on the left medial ankle wound. This was apparently looking about the same on Tuesday when she was here for nurse visit. 11/13/17; deep tissue culture I did last time of the left lower leg showed multiple organisms without any predominating. In particular no Staphylococcus or group A strep were isolated. We sent her for venous reflux studies. She's had a previous left greater saphenous vein stripping and I think sclerotherapy of the right greater saphenous vein. She didn't  really look at the lesser saphenous vein this  both wounds are on the medial aspect. She has reflux in the common femoral vein and popliteal vein and an accessory vein on the right and the common femoral vein and popliteal vein on the left. I'm going to have her go to see vein and vascular just the look over things and see if anything else beside aggressive compression is indicated here. We have not been able to make any progress on these wounds in spite of the fact that the surface of the wounds is never look too bad. 11/20/17; no major change in the condition of the wounds. Patient reports a large amount of drainage. She has a lot of complaints of pain although enlisting her today I wonder if some of this at least his neuropathic rather than secondary to her wounds. She has an appointment with vein and vascular on 12/30/17. The refractory nature of these wounds in my mind at least need vein and vascular to look over the wounds the recent reflux studies we did and her history to see if anything further can be done here. I also note her gait is deteriorated quite a bit. 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 She has a bilateral Trendelenburg gait. I wonder if this is neuropathic or perhaps multilevel radicular. 11/27/17; her wounds actually looks slightly better. Healthy-looking granulation tissue a scant amount of epithelialization. [place] [employer] will not pay for [employer]. They will play for tri layer Oasis and Dermagraft. This is not a diabetic ulcer. We'll try for the tri layer Oasis. She still complains of some drainage. She has a vein and vascular appointment on 12/30/17 12/04/17; the wounds visually look quite good. Healthy-looking granulation with some degree of epithelialization. We are still waiting for response to our request for trial to try layer Oasis. Her appointment with vascular to review venous and arterial issues isn't sold the end of July 7/31. Not allow debridement or even vigorous cleansing of the wound  surface. 12/18/17; slightly smaller especially on the right. 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 We've been using Hydrofera Blue. We still are looking into triple layer Oasis through her insurance 01/08/18 on evaluation today patient's wound actually appears to be showing signs of good improvement at this point in time. She has been tolerating the dressing changes without complication. Fortunately there does not appear to be any evidence of infection at this point in time. We have been utilizing silver nitrate which does seem to be of benefit for her which is also good news. Overall I'm very happy with how things seem to be both regards appearance as well as measurement. Patient did see Dr. Bridgett Larsson for evaluation on 12/30/17. In his assessment he felt that stripping would not likely add much more than chronic compression to the patient's healing process. His recommendation was to follow-up in three months with Dr. Doren Custard if she hasn't healed in order to consider referral back to you and see vascular where she previously was in a trial and was able to get her wound to heal. I'll be see what she feels she when you staying compression and he reiterated this as well. 01/13/18 on evaluation today patient appears to actually be doing very well in regard to her bilateral medial malleolus ulcers. She seems to have tolerated the chemical cauterization with silver nitrate last week she did have some pain through that evening but fortunately states that I'll be see since it seems  to be doing better she is overall pleased with the progress. 01/21/18; really quite a remarkable improvement since I've last seen these wounds. We started using silver nitrate specially on the islands of hyper granulation which for some reason her around the wound circumference. This is really done quite nicely. Primary dressing Hydrofera Blue under 4 layer compression. She seems to be able to hold out without a  nurse rewrap. Follow-up in 1 week 01/28/18; we've continued the hydrofera blue but continued with chemical cauterization to the wound area that we started about a month ago for irregular hyper granulation. She is made almost stunning improvement in the overall wound dimensions. I was not really expecting this degree of improvement in these chronic wounds 02/05/18; we continue with Hydrofera Bluebut of also continued the aggressive chemical cauterization with silver nitrate. We made nice progress with the right greater than left wound. 02/12/18. We continued with Hydrofera Blue after aggressive chemical cauterization with silver nitrate. We appear to be making nice progress with both wound areas 02/19/2018; we continue with [facility] after washing the wounds vigorously with Anasept spray and chemical cauterization with silver nitrate. We are making excellent progress. The area on the right's just about closed 02/26/2018. The area on the left medial ankle had too much necrotic debris today. I used a #5 curette we are able to get most of the soft. I continued with the silver nitrate to the much smaller wound on the right medial ankle she had a new area on her right lower pretibial area which she says was due to a role in her compression 03/05/2018; both wound areas look healthy. Not much change in dimensions from last week. I continue to use silver nitrate and Hydrofera Blue. The patient saw Dr. Doren Custard of vein and vascular. He felt she had venous stasis ulcers. He felt based on her previous arteriogram she should have adequate circulation for healing. Also she has deep venous reflux but really no significant correctable superficial venous reflux at this time. He felt we should continue with conservative management including leg elevation and compression 04/02/2018; since we last saw this woman about a month ago she had a fall apparently suffered a pelvic fracture. I did not look up the x-ray.  Nevertheless because of pain she literally was bedbound for 2 weeks and had home health coming out to change the dressing. Somewhat predictably this is resulted in considerable improvement in both wound areas. The right is just about closed on the medial malleolus and the left is about half the size. 04/16/2018; both her wounds continue to go down in size. Using Hydrofera Blue. 05/07/18; both her wounds appeared to be improving especially on the right where it is almost closed. We are using Hydrofera Blue 05/14/2018; slightly worse this week with larger wounds. Surface on the left medial not quite as good. We have been using Hydrofera Blue 05/21/18; again the wounds are slightly larger. Left medial malleolus slightly larger with eschar around the circumference. We have been using Hydrofera Blue undergoing a wraps for a prolonged period of time. This got a lot better when she was more recumbent due to a fall and a back injury. I change the primary dressing the silver alginate today. She did not tolerate a 4 layer compression previously although I may need to bring this up with her next time 05/28/2018; area on the left medial malleolus again is slightly larger with more drainage. Area on the right is roughly unchanged. She has a small  area of folliculitis on the right medial just on the lower calf. This does not look ominous. 06/03/2018 left medial malleolus slightly smaller in a better looking surface. We used silver nitrate on this last time with silver alginate. The area on the right appears slightly smaller 1/10; left medial malleolus slightly smaller. Small open area on the right. We used silver nitrate and silver alginate as of 2 weeks ago. We continue with the wound and compression. These got a lot better when she was off her feet 1/17; right medial malleolus wound is smaller. The left may be slightly smaller. Both surfaces look somewhat better. 1/24; both wounds are slightly smaller. Using silver  alginate under Unna boots 1/31; both wounds appear smaller in fact the area on the right medial is just about closed. Surface eschar. We have been using silver alginate under Unna boots. The patient is less active now spends let much less time on her feet and I think this is contributed to the general improvement in the wound condition 2/7; both wounds appear smaller. I was hopeful the right medial would be closed however there there is still the same small open area. Slight amount of surface eschar on the left the dimensions are smaller there is eschar but the wound edges appear to be free. We have been using silver alginate under Unna boot's 2/14; both wounds once again measure smaller. Circumferential eschar on the left medial. We have been using silver alginate under Unna boots with gradual improvement 2/21; the area on the right medial malleolus has healed. The area on the left is smaller. We have been using silver alginate and Unna boots. We can discharge wrapping the right leg she has 20/30 stockings at home she will need to protect the scar tissue in this area 2/28; the area on the right medial malleolus remains closed the patient has a compression stocking. The area on the left is smaller. We have been using silver alginate and Unna boots. 3/6 the area on the right medial ankle remains closed. Good edema control noted she is using her own compression stocking. The area on the left medial ankle is smaller. We have been managing this with silver alginate and Unna boots which we will continue today. 3/13; the area on the right medial ankle remains closed and I'm declaring it healed today. When necessary the left is about the same still a healthy-looking surface but no major change and wound area. No evidence of infection and using silver alginate under unna and generally making considerable improvement 3/27 the area on the right medial ankle remains closed the area on the left is about the same  as last week. Certainly not any worse we have been using silver alginate under an Unna boot 4/3; the area on the right medial ankle remains closed per the patient. We did not look at this wound. The wound on the left medial ankle is about the same surface looks healthy we have been using silver alginate under an Unna boot 4/10; area on the right medial ankle remains closed per the patient. We did not look at this wound. The wound on the left medial ankle is slightly larger. The patient complains that the [facility] caused burning pain all week. She also told us that she was a lot more active this week. Changed her back to silver alginate 4/17; right medial ankle still closed per the patient. Left medial ankle is slightly larger. Using silver alginate. She did not tolerate Hydrofera Blue  on this area 4/24; right medial ankle remains closed we have not look at this. The left medial ankle continues to get larger today by about a centimeter. We have been using silver alginate under Unna boots. She complains about 4 layer compression as an alternative. She has been up on her feet working on her garden 5/8; right medial ankle remains closed we did not look at this. The left medial ankle has increased in size about 100%. We have been using silver alginate under Unna boots. She noted increased pain this week and was not surprised that the wound is deteriorated 5/15; no major change in SA however much less erythema ( one week of doxy ocellulitis). 5/22-64 year old female returns at 1 week to the clinic for left medial ankle wound for which we have been using silver alginate under 3 layer compression She was placed on DOXY at last visit - the wound is wider at this visit. She is in 3 layer compression 5/29; change to [facility] last week. I had given her empiric doxycycline 2 weeks ago for a week. She is in 3 layer compression. She complains of a lot of pain and drainage on presentation today. 6/5;  using Hydrofera Blue. I gave her doxycycline recently empirically for erythema and pain around the wound. Believe her cultures showed enterococcus which not would not have been well covered by doxycycline nevertheless the wound looks better and I don't feel specifically that the enterococcus needs to be covered. She has a new what looks like a wrap injury on her lateral left ankle. 6/12; she is using Hydrofera Blue. She has a new area on the left anterior lower tibial area. This was a wrap injury last week. 6/19; the patient is using Hydrofera Blue. She arrived with marked inflammation and erythema around the wound and tenderness. 12/01/18 on evaluation today patient appears to be doing a little bit better based on what I'm hearing from the standpoint of lassos evaluation to this as far as the overall appearance of the wound is concerned. Then sometime substandard she typically sees Dr. Dellia Nims. Nonetheless overall very pleased with the progress that she's made up to this point. No fevers, chills, nausea, or vomiting noted at this time. 7/10; some improvement in the surface area. Aggressively debrided last week apparently. I went ahead with the debridement today although the patient does not tolerate this very well. We have been using Iodoflex. Still a fair amount of drainage 7/17; slightly smaller. Using Iodoflex. 7/24; no change from last week in terms of surface area. We have been using Iodoflex. Surface looks and continues to look somewhat better 7/31; surface area slightly smaller better looking surface. We have been using Iodoflex. This is under Unna boot compression 8/7-Patient presents at 1 week with Unna boot and Iodoflex, wound appears better 8/14-Patient presents at 1 week with Iodoflex, we use the Unna boot, wound appears to be stable better.Patient is getting Botox treatment for the inversion of the foot for tendon release, Next week 8/21; we are using Iodoflex. Unna boot. The wound is  stable in terms of surface area. Under illumination there is some areas of the wound that appear to be either epithelialized or perhaps this is adherent slough at this point I was not really clear. It did not wipe off and I was reluctant to debride this today. 8/28; we are using Iodoflex in an Unna boot. Seems to be making good improvement. 9/4; using Iodoflex and wound is slightly smaller. 9/18; we are  using Iodoflex with topical silver nitrate when she is here. The wound continues to be smaller 10/2; patient missed her appointment last week due to GI issues. She left and Iodoflex based dressing on for 2 weeks. Wound is about the same size about the size of a dime on the left medial lower 10/9 we have been using Iodoflex on the medial left ankle wound. She has a new superficial probable wrap injury on the dorsal left ankle 10/16; we have been using Hydrofera Blue since last week. This is on the left medial ankle 10/23; we have been using Hydrofera Blue since 2 weeks ago. This is on the left medial ankle. Dimensions are better 11/6; using Hydrofera Blue. I think the wound is smaller but still not closed. Left medial ankle 11/13; we have been using Hydrofera Blue. Wound is certainly no smaller this week. Also the surface not as good. This is the remanent of a very large area on her left medial ankle. 11/20; using Sorbact since last week. Wound was about the same in terms of size although I was disappointed about the surface debris 12/11; 3-week follow-up. Patient was on vacation. Wound is measuring slightly larger we have been using Sorbact. 12/18; wound is about the same size however surface looks better last week after debridement. We have been using Sorbact under compression 1/15 wound is probably twice the size of last time increased in length nonviable surface. We have been using Sorbact. She was running a mild fever and missed her appointment last week 1/22; the wound is come down in size but  under illumination still a very adherent debris we have been Hydrofera Blue that I changed her to last week 1/29; dimensions down slightly. We have been using Hydrofera Blue 2/19 dimensions are the same however there is rims of epithelialization under illumination. Therefore more the surface area may be epithelialized 2/26; the patient's wound actually measures smaller. The wound looks healthy. We have been using Hydrofera Blue. I had some thoughts about running Apligraf then I still may do that however this looks so much better this week we will delay that for now 3/5; the wound is small but about the same as last week. We have been using Hydrofera Blue. No debridement is required today. 3/19; the wound is about the size of a dime. Healthy looking wound even under illumination. We have been using Hydrofera Blue. No mechanical debridement is necessary 3/26; not much change from last week although still looks very healthy. We have been using Hydrofera Blue under Unna boots Patient was offered an ankle fusion by podiatry but not until the wound heals with a proceed with this. 4/9; the patient comes in today with her original wound on the medial ankle looking satisfactory however she has some uncontrolled swelling in the middle part of her leg with 2 new open areas superiorly just lateral to the tibia. I think this was probably a wrap issue. She said she felt uncomfortable during the week but did not call in. We have been using Hydrofera Blue 4/16; the wound on the medial ankle is about the same. She has innumerable small areas superior to this across her mid tibia. I think this is probably folliculitis. She is also been working in the yard doing a lot of sweating 4/30; the patient issue on the upper areas across her mid tibia of all healed. I think this was excessive yard work if I remember. Her wound on the medial ankle is smaller. Some debris  on this we have been using Hydrofera Blue under [employer] 5/7; mid tibia. She has been using Hydrofera Blue under an Unna wrap. She is apparently going for her ankle surgery on June 3 10/28/19-Patient returns to clinic with the ankle wound, we are using Hydrofera Blue under Unna wrap, surgery is scheduled for her left foot for June 3 so she will be back for nurse visit next week READMISSION 01/17/2020 Mrs. Lapre is a 64 year old woman we have had in this clinic for a long period of time with severe venous hypertension and refractory wounds on her medial lower legs and ankles bilaterally. 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 When she was off her legs for a prolonged period example when she fell and suffered a compression fracture things would heal up quite nicely. She is now retired and we managed to heal up the right medial leg wound. The left one was very tiny last time I saw this although still refractory. 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 It got to the point where this was interfering with her walking and she elected to proceed with a ankle arthrodesis to straighten her her ankle and leave her with a functional outcome for mobilization. The patient was referred to Dr. Doren Custard and really this took some time to arrange. Dr. Doren Custard saw her on 12/07/2019. Once again he verified that she had no arterial issues. She had previously had an angiogram several years ago. Follow-up ABIs on the left showed an ABI of 1.12 with triphasic waveforms and a TBI of 0.92. She is felt to have chronic deep venous insufficiency but I do not think it was felt that anything could be done from about this from an ablation point of view. At the time Dr. Doren Custard saw this patient the wounds actually look closed via the pictures in his clinic. The patient finally  underwent her surgery on 12/15/2019. This went reasonably well and there was a good anatomic outcome. She developed a small distal wound dehiscence on the lateral part of the surgical wound. However more problematically she is developed recurrence of the wound on the medial left ankle. There are actually 2 wounds here one in the distal lower leg and 1 pretty much at the level of the medial malleolus. It is a more distal area that is more problematic. She has been using Hydrofera Blue which started on Friday before this she was simply Ace wrapping. There was a culture done that showed Pseudomonas and she is on ciprofloxacin. A recent CNS on 8/11 was negative. The patient reports some pain but I generally think this is improving. She is using a cam boot completely nonweightbearing using a walker for pivot transfers and a wheelchair Patient History Information obtained from Patient. Allergies penicillin (Severity: Severe, Reaction: hives), doxycycline (Reaction: vomiting) Family History Cancer - Maternal Grandparents,Father, Diabetes - Paternal Grandparents,Maternal Grandparents, Heart Disease - Paternal Grandparents, Hypertension - Maternal Grandparents, Lung Disease - Father, Stroke - Paternal Grandparents, No family history of Hereditary Spherocytosis, Kidney Disease, Seizures, Thyroid Problems, Tuberculosis. Social History Former smoker - quit 2008, Marital Status - Married, Alcohol Use - Rarely, Drug Use - No History, Caffeine Use - Daily - soda. Medical History Eyes Denies history of Cataracts, Glaucoma, Optic Neuritis Cardiovascular Patient has history  of Peripheral Venous Disease Endocrine Denies history of Type I Diabetes, Type II Diabetes Genitourinary Denies history of End Stage Renal Disease Integumentary (Skin) Denies history of History of Burn Musculoskeletal Patient has history of Osteoarthritis Denies history of Gout, Rheumatoid Arthritis, Osteomyelitis Oncologic Denies  history of Received Chemotherapy, Received Radiation Psychiatric Patient has history of Confinement Anxiety Denies history of Anorexia/bulimia Hospitalization/Surgery History - post tibiotalar calcaneal arthrodesis. Review of Systems (ROS) Constitutional Symptoms (General Health) Denies complaints or symptoms of Fatigue, Fever, Chills, Marked Weight Change. Eyes Complains or has symptoms of Glasses / Contacts - reading. Ear/Nose/Mouth/Throat Denies complaints or symptoms of Chronic sinus problems or rhinitis. Respiratory Denies complaints or symptoms of Chronic or frequent coughs, Shortness of Breath. Cardiovascular Denies complaints or symptoms of Chest pain. Gastrointestinal Denies complaints or symptoms of Frequent diarrhea, Nausea, Vomiting. Endocrine Denies complaints or symptoms of Heat/cold intolerance. Genitourinary Denies complaints or symptoms of Frequent urination. Integumentary (Skin) Complains or has symptoms of Wounds - left medial ankle. Musculoskeletal Complains or has symptoms of Muscle Weakness. Neurologic Complains or has symptoms of Numbness/parasthesias - left toes. Psychiatric Complains or has symptoms of Claustrophobia. Denies complaints or symptoms of Suicidal. Objective Constitutional Sitting or standing Blood Pressure is within target range for patient.. Pulse regular and within target range for patient.Marland Kitchen Respirations regular, non-labored and within target range.. Temperature is normal and within the target range for the patient.Marland Kitchen Appears in no distress. Vitals Time Taken: 9:05 AM, Height: 68 in, Source: Stated, Temperature: 98.3 F, Pulse: 61 bpm, Respiratory Rate: 18 breaths/min, Blood Pressure: 113/68 mmHg. Respiratory work of breathing is normal. Cardiovascular Pedal pulses are palpable on the left foot and left popliteal. General Notes: Wound exam ooMore problematically the area on the left medial lower leg and ankle is her major concern.  There are actually 2 wound areas the more superior 1 appears to be epithelializing. There is some hyper granulation but nothing required debridement here. Distally the wound does not look too bad except for any its most inferior portion. A necrotic area which I think is probably partially tendon surrounded by debris which is adherent. I used a #5 curette to remove some of this including the tendon. I do not know that it tendon here is a problem the ankle is fused no tendon is going to be really functional. Hemostasis with direct pressure. I think this area is going to be problematic the rest of her wound areas do not look too bad ooOn the left lateral she has a small open area distally in the surgical site there is some depth here but this looks clean Integumentary (Hair, Skin) No erythema around either wound area. Wound #14 status is Open. Original cause of wound was Surgical Injury. The wound is located on the Left,Lateral Ankle. The wound measures 1.8cm length x 0.5cm width x 0.5cm depth; 0.707cm^2 area and 0.353cm^3 volume. There is Fat Layer (Subcutaneous Tissue) Exposed exposed. There is no undermining noted, however, there is tunneling at 12:00 with a maximum distance of 0.6cm. There is a medium amount of serosanguineous drainage noted. The wound margin is distinct with the outline attached to the wound base. There is medium (34-66%) red granulation within the wound bed. There is a medium (34-66%) amount of necrotic tissue within the wound bed including Adherent Slough. Wound #15 status is Open. Original cause of wound was Gradually Appeared. The wound is located on the Left,Medial Malleolus. The wound measures 5.5cm length x 2.7cm width x 0.8cm depth; 11.663cm^2 area and 9.331cm^3 volume.  There is tendon and Fat Layer (Subcutaneous Tissue) Exposed exposed. There is no tunneling or undermining noted. There is a medium amount of serosanguineous drainage noted. The wound margin is flat and  intact. There is medium (34- 66%) red granulation within the wound bed. There is a medium (34-66%) amount of necrotic tissue within the wound bed including Eschar and Adherent Slough. Wound #16 status is Open. Original cause of wound was Gradually Appeared. The wound is located on the Left,Medial Lower Leg. The wound measures 1.8cm length x 1.2cm width x 0.1cm depth; 1.696cm^2 area and 0.17cm^3 volume. There is Fat Layer (Subcutaneous Tissue) Exposed exposed. There is no tunneling or undermining noted. There is a medium amount of serous drainage noted. The wound margin is flat and intact. There is large (67-100%) red granulation within the wound bed. There is no necrotic tissue within the wound bed. Assessment Active Problems ICD-10 Chronic venous hypertension (idiopathic) with ulcer and inflammation of left lower extremity Non-pressure chronic ulcer of other part of left lower leg with other specified severity Non-pressure chronic ulcer of left ankle with other specified severity Disruption of external operation (surgical) wound, not elsewhere classified, subsequent encounter Procedures Wound #15 Pre-procedure diagnosis of Wound #15 is a Venous Leg Ulcer located on the Left,Medial Malleolus .Severity of Tissue Pre Debridement is: Other severity specified. There was a Excisional Skin/Subcutaneous Tissue/Muscle Debridement with a total area of 14.85 sq cm performed by [name]., MD. With the following instrument(s): Blade, and Curette to remove Viable and Non-Viable tissue/material. Material removed includes Tendon, Subcutaneous Tissue, and Slough. A time out was conducted at 10:52, prior to the start of the procedure. A Minimum amount of bleeding was controlled with Pressure. The procedure was tolerated well with a pain level of 3 throughout and a pain level of 0 following the procedure. Post Debridement Measurements: 5.5cm length x 2.7cm width x 0.8cm depth; 9.331cm^3 volume. Character  of Wound/Ulcer Post Debridement is improved. Severity of Tissue Post Debridement is: Other severity specified. Post procedure Diagnosis Wound #15: Same as Pre-Procedure Pre-procedure diagnosis of Wound #15 is a Venous Leg Ulcer located on the Left,Medial Malleolus . There was a Haematologist Compression Therapy Procedure by Levan Hurst, RN. Post procedure Diagnosis Wound #15: Same as Pre-Procedure Wound #14 Pre-procedure diagnosis of Wound #14 is an Open Surgical Wound located on the Left,Lateral Ankle . There was a Haematologist Compression Therapy Procedure by Levan Hurst, RN. Post procedure Diagnosis Wound #14: Same as Pre-Procedure Wound #16 Pre-procedure diagnosis of Wound #16 is a Venous Leg Ulcer located on the Left,Medial Lower Leg . There was a Haematologist Compression Therapy Procedure by Levan Hurst, RN. Post procedure Diagnosis Wound #16: Same as Pre-Procedure Plan Follow-up Appointments: Return Appointment in 1 week. Nurse Visit: - Friday for rewrap Dressing Change Frequency: Do not change entire dressing for one week. - all wounds Skin Barriers/Peri-Wound Care: Moisturizing lotion Wound Cleansing: May shower with protection. - use cast protector Primary Wound Dressing: Wound #14 Left,Lateral Ankle: Hydrofera Blue - Classic Wound #15 Left,Medial Malleolus: Hydrofera Blue - Classic Wound #16 Left,Medial Lower Leg: Hydrofera Blue - Classic Secondary Dressing: Wound #14 Left,Lateral Ankle: Dry Gauze Wound #15 Left,Medial Malleolus: Dry Gauze ABD pad Wound #16 Left,Medial Lower Leg: Dry Gauze ABD pad Edema Control: Unna Boot to Left Lower Extremity Avoid standing for long periods of time Elevate legs to the level of the heart or above for 30 minutes daily and/or when sitting, a frequency of: - throughout the day  Off-Loading: Removable cast walker boot to: - left foot 1. I agree with Chrys Racer that Dr. [name] had started last week. This would have the advantage  of some debridement properties as well as supporting closure of the wounds that are epithelializing. I would put this in the surgical area as well as the wounds medially 2. I have not altered the antibiotics which is ciprofloxacin. I did not see anything that currently required culturing 3. The problematic area is the distal wound medially. There is a lot of debris here which I think covers some tendon. If the tendon needs to be sacrificed I do not really think the issue is major although the patient seemed a little disturbed by it. 4. Likely to require more mechanical debridement but most problematically I think she needs compression I have put her back in an Unna boot on this side I will bring her back later this week for a nurse visit to make sure her ankle on both sides is tolerating this. Noteworthy that she has severe venous hypertension and although she does not have a lot of edema any edema in this area is going to result in further tissue breakdown. I hope she will be able to tolerate this. 5. We continued with the cam boot and rigorous offloading that is been suggested by her surgeon. The patient is compliant Electronic Signature(s) Signed: 01/17/2020 6:57:29 PM By: Linton Ham MD Entered By: Linton Ham on 01/17/2020 11:32:56 -------------------------------------------------------------------------------- HxROS Details Patient Name: Date of Service: [name], [name] 01/17/2020 9:00 A M Medical Record Number: [id number] Patient Account Number: [id number] Date of Birth/Sex: Treating RN: 04-27-1956 (64 y.o. [name] Provider: [name] Other Clinician: Referring Provider: Treating Provider/Extender: [name] in Treatment: 0 Information Obtained From Patient Constitutional Symptoms (General Health) Complaints and Symptoms: Negative for: Fatigue; Fever; Chills; Marked Weight Change Eyes Complaints and  Symptoms: Positive for: Glasses / Contacts - reading Medical History: Negative for: Cataracts; Glaucoma; Optic Neuritis Ear/Nose/Mouth/Throat Complaints and Symptoms: Negative for: Chronic sinus problems or rhinitis Respiratory Complaints and Symptoms: Negative for: Chronic or frequent coughs; Shortness of Breath Cardiovascular Complaints and Symptoms: Negative for: Chest pain Medical History: Positive for: Peripheral Venous Disease Gastrointestinal Complaints and Symptoms: Negative for: Frequent diarrhea; Nausea; Vomiting Endocrine Complaints and Symptoms: Negative for: Heat/cold intolerance Medical History: Negative for: Type I Diabetes; Type II Diabetes Genitourinary Complaints and Symptoms: Negative for: Frequent urination Medical History: Negative for: End Stage Renal Disease Integumentary (Skin) Complaints and Symptoms: Positive for: Wounds - left medial ankle Medical History: Negative for: History of Burn Musculoskeletal Complaints and Symptoms: Positive for: Muscle Weakness Medical History: Positive for: Osteoarthritis Negative for: Gout; Rheumatoid Arthritis; Osteomyelitis Neurologic Complaints and Symptoms: Positive for: Numbness/parasthesias - left toes Psychiatric Complaints and Symptoms: Positive for: Claustrophobia Negative for: Suicidal Medical History: Positive for: Confinement Anxiety Negative for: Anorexia/bulimia Hematologic/Lymphatic Immunological Oncologic Medical History: Negative for: Received Chemotherapy; Received Radiation Immunizations Pneumococcal Vaccine: Received Pneumococcal Vaccination: No Implantable Devices Yes Hospitalization / Surgery History Type of Hospitalization/Surgery post tibiotalar calcaneal arthrodesis Family and Social History Cancer: Yes - Maternal Grandparents,Father; Diabetes: Yes - Paternal Grandparents,Maternal Grandparents; Heart Disease: Yes - Paternal Grandparents; Hereditary Spherocytosis: No;  Hypertension: Yes - Maternal Grandparents; Kidney Disease: No; Lung Disease: Yes - Father; Seizures: No; Stroke: Yes - Paternal Grandparents; Thyroid Problems: No; Tuberculosis: No; Former smoker - quit 2008; Marital Status - Married; Alcohol Use: Rarely; Drug Use: No History; Caffeine Use: Daily - soda; Financial Concerns:  No; Food, Clothing or Shelter Needs: No; Support System Lacking: No; Transportation Concerns: No Electronic Signature(s) Signed: 01/17/2020 5:25:51 PM By: Baruch Gouty RN, BSN Signed: 01/17/2020 6:57:29 PM By: Linton Ham MD Entered By: Baruch Gouty on 01/17/2020 09:16:00 -------------------------------------------------------------------------------- SuperBill Details Patient Name: Date of Service: [name], [name] 01/17/2020 Medical Record Number: [id number] Patient Account Number: [id number] Date of Birth/Sex: Treating RN: [birth year] (64 y.o. [name] Primary Care Provider: [name] Other Clinician: Referring Provider: Treating Provider/Extender: [name] in Treatment: 0 Diagnosis Coding ICD-10 Codes Code Description [phone] Chronic venous hypertension (idiopathic) with ulcer and inflammation of left lower extremity L97.828 Non-pressure chronic ulcer of other part of left lower leg with other specified severity L97.328 Non-pressure chronic ulcer of left ankle with other specified severity T81.31XD Disruption of external operation (surgical) wound, not elsewhere classified, subsequent encounter Facility Procedures CPT4 Code: [id number] Description: [place] VISIT-LEV 3 EST PT Modifier: 25 Quantity: 1 CPT4 Code: [id number] Description: 97416 - DEB MUSC/FASCIA 20 SQ CM/< ICD-10 Diagnosis Description L97.828 Non-pressure chronic ulcer of other part of left lower leg with other specified s Modifier: everity Quantity: 1 Physician Procedures : CPT4 Code Description Modifier [id number] - WC PHYS LEVEL 4 -  EST PT 25 ICD-10 Diagnosis Description I87.332 Chronic venous hypertension (idiopathic) with ulcer and inflammation of left lower extremity L97.828 Non-pressure chronic ulcer of other  part of left lower leg with other specified severity L97.328 Non-pressure chronic ulcer of left ankle with other specified severity T81.31XD Disruption of external operation (surgical) wound, not elsewhere classified, subsequent encounter [id number] -  WC PHYS DEBR MUSCLE/FASCIA 20 SQ CM 1 ICD-10 Diagnosis Description L97.828 Non-pressure chronic ulcer of other part of left lower leg with other specified severity Quantity: 1 Electronic Signature(s) Signed: 01/19/2020 5:36:57 PM By: Levan Hurst RN, BSN Signed: 01/19/2020 5:57:44 PM By: Linton Ham MD Previous Signature: 01/17/2020 6:57:29 PM Version By: Linton Ham MD Entered By: Levan Hurst on 01/18/2020 07:29:40

## 2020-01-31 ENCOUNTER — Other Ambulatory Visit (HOSPITAL_COMMUNITY): Payer: Self-pay | Admitting: Internal Medicine

## 2020-01-31 ENCOUNTER — Ambulatory Visit (HOSPITAL_COMMUNITY)
Admission: RE | Admit: 2020-01-31 | Discharge: 2020-01-31 | Disposition: A | Discharge status: Home / Self Care | Payer: Medicare Other | Source: Ambulatory Visit | Attending: Internal Medicine | Admitting: Internal Medicine

## 2020-01-31 ENCOUNTER — Other Ambulatory Visit: Payer: Self-pay

## 2020-01-31 ENCOUNTER — Encounter (HOSPITAL_BASED_OUTPATIENT_CLINIC_OR_DEPARTMENT_OTHER): Payer: Medicare Other | Admitting: Internal Medicine

## 2020-01-31 DIAGNOSIS — S81802A Unspecified open wound, left lower leg, initial encounter: Secondary | ICD-10-CM | POA: Insufficient documentation

## 2020-01-31 DIAGNOSIS — I87332 Chronic venous hypertension (idiopathic) with ulcer and inflammation of left lower extremity: Secondary | ICD-10-CM | POA: Diagnosis not present

## 2020-01-31 NOTE — Progress Notes (Signed)
HOUDA, BRAU ([id number]) Visit Report for 01/31/2020 HPI Details Patient Name: Date of Service: [name], [name] 01/31/2020 10:30 A M Medical Record Number: [id number] Patient Account Number: [id number] Date of Birth/Sex: Treating RN: 07/20/1955 (64 y.o. [name] Primary Care Provider: [name] Other Clinician: Referring Provider: Treating Provider/Extender: [name] in Treatment: 2 History of Present Illness HPI Description: the remaining wound is over the left medial ankle. Similar wound over the right medial ankle healed largely with use of Apligraf. Most recently we have been using Hydrofera Blue over this wound with considerable improvement. The patient has been extensively worked up in the past for her venous insufficiency and she is not a candidate for antireflux surgery although I have none of the details available currently. 08/24/14; considerable improvement today. About 50% of this wound areas now epithelialized. The base of the wound appears to be healthier granulation.as opposed to last week when she had deteriorated a considerable improvement 08/17/14; unfortunately the wound has regressed somewhat. The areas of epithelialization from the superior aspect are not nearly as healthy as they were last week. The patient thinks her Hydrofera Blue slipped. 09/07/14; unfortunately the area has markedly regressed in the 2 weeks since I've seen this. There is an odor surrounding erythema. The healthy granulation tissue that we had at the base of the wound now is a dusky color. The nurse reports green drainage 09/14/14; the area looks somewhat better than last week. There is less erythema and less drainage. The culture I did did not show any growth. Nevertheless I think it is better to continue the Cipro and doxycycline for a further week. The remaining wound area was debridement. 09/21/14. Wound did not require debridement last week. Still less  erythema and less drainage. She can complete her antibiotics. The areas of epithelialization in the superior aspect of the wound do not look as healthy as they did some weeks ago 10/05/14 continued improvement in the condition of this wound. There is advancing epithelialization. Less aggressive debridement required 10/19/14 continued improvement in the condition and volume of this wound. Less aggressive debridement to the inferior part of this to remove surface slough and fibrinous eschar 11/02/14 no debridement is required. The surface granulation appears healthy although some of her islands of epithelialization seem to have regressed. No evidence of infection 11/16/14; lites surface debridement done of surface eschar. The wound does not look to be unhealthy. No evidence of infection. Unfortunately the patient has had podiatry issues in the right foot and for some reason has redeveloped small surface ulcerations in the medial right ankle. Her original presentation involved wounds in this area 11/23/14 no debridement. The area on the right ankle has enlarged. The left ankle wound appears stable in terms of the surface although there is periwound inflammation. There has been regression in the amount of new skin 11/30/14 no debridement. Both wound areas appear healthy. There was no evidence of infection. The the new area on the right medial ankle has enlarged although that both the surfaces appear to be stable. 12/07/14; Debridement of the right medial ankle wound. No no debridement was done on the left. 12/14/14 no major change in and now bilateral medial ankle wounds. Both of these are very painful but the no overt evidence of infection. She has had previous venous ablation 12/21/14; patient states that her right medial ankle wound is considerably more painful last week than usual. Her left is also somewhat painful. She could not  tolerate debridement. The right medial ankle wound has fibrinous surface  eschar 12/28/14 this is a patient with severe bilateral venous insufficiency ulcers. For a considerable period of time we actually had the one on the right medial ankle healed however this recently opened up again in June. The left medial ankle wound has been a refractory area with some absent flows. We had some success with Hydrofera Blue on this area and it literally closed by 50% however it is recently opened up Foley. Both of these were debridement today of surface eschar. She tolerates this poorly 01/25/15: No change in the status of this. Thick adherent escar. Very poor tolerance of any attempt at debridement. I had healed the right medial malleolus wound for a considerable amount of time and had the left one down to about 50% of the volume although this is totally regressed over the last 48 weeks. Further the right leg has reopened. she is trying to make a appointment with pain and vascular, previous ablations with Dr. Aleda Grana. I do not believe there is an arterial insufficiency issue here 02/01/15 the status of the adherent eschar bilaterally is actually improved. No debridement was done. She did not manage to get vascular studies done 02/08/15 continued debridement of the area was done today. The slough is less adherent and comes off with less pressure. There is no surrounding infection peripheral pulses are intact 02/15/15 selective debridement with a disposable curette. Again the slough is less adherent and comes off with less difficulty. No surrounding infection peripheral pulses are intact. 02/22/15 selective debridement of the right medial ankle wound. Slough comes off with less difficulty. No obvious surrounding infection peripheral pulses are intact I did not debridement the one on the left. Both of these are stable to improved 03/01/15 selective debridement of both wound areas using a curette to. Adherent slough cup soft with less difficulty. No obvious surrounding infection.  The patient tells me that 2 days ago she noted a rash above the right leg wrap. She did not have this on her lower legs when she change this over she arrives with widespread left greater than right almost folliculitis-looking rash which is extremely pruritic. I don't see anything to culture here. There is no rash on the rest of her body. She feels well systemically. 03/08/15; selective debridement of both wounds using a curette. Base of this does not look unhealthy. She had limegreen drainage coming out of the left leg wound and describes a lot of drainage. The rash on her left leg looks improved to. No cultures were done. 03/22/15; patient was not here last week. Basal wounds does not look healthy and there is no surrounding erythema. No drainage. There is still a rash on the left leg that almost looks vasculitic however it is clearly limited to the top of where the wrap would be. 04/05/15; on the right required a surgical debridement of surface eschar and necrotic subcutaneous tissue. I did not debridement the area on the left. These continue to be large open wounds that are not changing that much. We were successful at one point in healing the area on the right, and at the same time the area on the left was roughly half the size of current measurements. I think a lot of the deterioration has to do with the prolonged time the patient is on her feet at work 04/19/15 I attempted-like surface debridement bilaterally she does not tolerate this. She tells me that she was in allergic care yesterday with  extreme pain over her left lateral malleolus/ankle and was told that she has an "sprain" 05/03/15; large bilateral venous insufficiency wounds over the medial malleolus/medial aspect of her ankles. She complains of copious amounts of drainage and his usual large amounts of pain. There is some increasing erythema around the wound on the right extending into the medial aspect of her foot to. historically she  came in with these wounds the right one healed and the left one came down to roughly half its current size however the right one is reopened and the left is expanded. 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 05/10/15 large bilateral venous insufficiency wounds. There is less adherence surface left however the surface culture that I did last week grew pseudomonas therefore bilateral selective debridement score necessary. There is surrounding erythema. The patient describes severe bilateral drainage and a lot of pain in the left ankle. Apparently her podiatrist was were ready to do a cortisone shot 05/17/15; the patient complains of pain and again copious amounts of drainage. 05/24/15; we used Iodo flex last week. Patient notes considerable improvement in wound drainage. Only needed to change this once. 05/31/15; we continued Iodoflex; the base of these large wounds bilaterally is not too bad but there is probably likely a significant bioburden here. I would like to debridement just doesn't tolerate it. 06/06/14 I would like to continue the Iodoflex although she still hasn't managed to obtain supplies. She has bilateral medial malleoli or large wounds which are mostly superficial. Both of them are covered circumferentially with some nonviable fibrinous slough although she tolerates debridement very poorly. She apparently has an appointment for an ablation on the right leg by interventional radiology. 06/14/15; the patient arrives with the wounds and static condition. We attempted a debridement although she does not do well with this secondary to pain. I 07/05/15; wounds are not much smaller however there appears to be a cleaner granulating base. The left has tight fibrinous slough greater than the right. Debridement is tolerated poorly due to pain. Iodoflex is done more for these wounds in any of the multitude of different dressings I have tried on the left  1 and then subsequently the right. 07/12/15; no change in the condition of this wound. I am able to do an aggressive debridement on the right but not the left. She simply cannot tolerate it. We have been using Iodoflex which helps somewhat. It is worthwhile remembering that at one point we healed the right medial ankle wound and the left was about 25% of the current circumference. We have suggested returning to vascular surgery for review of possible further ablations for one reason or another she has not been able to do this. 07/26/15 no major change in the condition of either wound on her medial ankle. I did not attempt to debridement of these. She has been aggressively scrubbing these while she is in the shower at home. She has her supply of Iodoflex which seems to have done more for these wounds then anything I have put on recently. 08/09/15 wound area appears larger although not verified by measurements. Using Iodoflex 09/05/2015 -- she was here for avisit today but had significant problems with the wound and I was asked to see her for a physician opinion. I have summarize that this lady has had surgery on her left lower extremity about 10 years ago where the possible veins stripping was done. She has  had an opinion from interventional radiology around November 2016 where no further sclerotherapy was ordered. The patient works 12 hours a day and stands on a concrete floor with work boots and is unable to get the proper compression she requires and cannot elevate her limbs appropriately at any given time. She has recently grown Pseudomonas from her wound culture but has not started her ciprofloxacin which was called in for her. 09/13/15 this continues to be a difficult situation for this patient. At one point I had this wound down to a 1.5 x 1.5" wound on her left leg. This is deteriorated and the right leg has reopened. She now has substantial wounds on her medial calcaneus, malleoli and into her  lower leg. One on the left has surface eschar but these are far too painful for me to debridement here. She has a vascular surgery appointment next week to see if anything can be done to help here. I think she has had previous ablations several years ago at [facility]. She has no major edema. She tells me that she did not get product last time [facility] Ag] and went for several days without it. She continues to work in work boots 12 hours a day. She cannot get compression/4-layer under her work boots. 09/20/15 no major change. Periwound edema control was not very good. Her point with pain and vascular is next Wednesday the 25th 09/28/15; the patient is seen vascular surgery and is apparently scheduled for repeat duplex ultrasounds of her bilateral lower legs next week. 10/05/15; the patient was seen by Dr. Doren Custard of vascular surgery. He feels that she should have arterial insufficiency excluded as cause/contributed to her nonhealing stage she is therefore booked for an arteriogram. She has apparently monophasic signals in the dorsalis pedis pulses. She also of course has known severe chronic venous insufficiency with previous procedures as noted previously. I had another long discussion with the patient today about her continuing to work 12 hour shifts. I've written her out for 2 months area had concerns about this as her work location is currently undergoing significant turmoil and this may lead to her termination. She is aware of this however I agree with her that she simply cannot continue to stand for 12 hours multiple days a week with the substantial wound areas she has. 10/19/15; the Dr. Doren Custard appointment was largely for an arteriogram which was normal. She does not have an arterial issue. He didn't make a comment about her chronic venous insufficiency for which she has had previous ablations. Presumably it was not felt that anything additional could be done. The patient is now out of work as I  prescribed 2 weeks ago. Her wounds look somewhat less aggravated presumably because of this. I felt I would give debridement another try today 10/25/15; no major change in this patient's wounds. We are struggling to get her product that she can afford into her own home through her insurance. 11/01/15; no major change in the patient's wounds. I have been using silver alginate as the most affordable product. I spoke to Dr. Marla Roe last week with her requested take her to the OR for surgical debridement and placement of ACEL. Dr. Marla Roe told me that she would be willing to do this however [facility] will not cover this, fortunately the patient has [place] healthcare of some variant 11/08/15; no major change in the patient's wounds. She has been completely nonviable surface that this but is in too much pain with any  attempted debridement are clinic. I have arranged for her to see Dr. Marla Roe ham of plastic surgery and this appointment is on Monday. I am hopeful that they will take her to the OR for debridement, possible ACEL ultimately possible skin graft 11/22/15 no major change in the patient's wounds over her bilateral medial calcaneus medial malleolus into the lower legs. Surface on these does not look too bad however on the left there is surrounding erythema and tenderness. This may be cellulitis or could him sleepy tinea. 11/29/15; no major changes in the patient's wounds over her bilateral medial malleolus. There is no infection here and I don't think any additional antibiotics are necessary. There is now plan to move forward. She sees Dr. Marla Roe in a week's time for preparation for operative debridement and ACEL placement I believe on 7/12. She then has a follow-up appointment with Dr. Marla Roe on 7/21 12/28/15; the patient returns today having been taken to the [facility] by Dr. Marla Roe 12/12/15 she underwent debridement, intraoperative cultures [which were negative]. She had  placement of a wound VAC. Parent really ACEL was not available to be placed. The wound VAC foam apparently adhered to the wound since then she's been using silver alginate, Xeroform under Ace wraps. She still says there is a lot of drainage and a lot of pain 01/31/16; this is a patient I see monthly. I had referred her to Dr. Marla Roe him of plastic surgery for large wounds on her bilateral medial ankles. She has been to the OR twice once in early July and once in early August. She tells me over the last 3 weeks she has been using the wound VAC with ACEL underneath it. On the right we've simply been using silver alginate. Under Kerlix Coban wraps. 02/28/16; this is a patient I'm currently seeing monthly. She is gone on to have a skin graft over her large venous insufficiency ulcer on the left medial ankle. This was done by Dr. Marla Roe him. The patient is a bit perturbed about why she didn't have one on her right medial ankle wound. She has been using silver alginate to this. 03/06/16; I received a phone call from her plastic surgery Dr. Marla Roe. She expressed some concern about the viability of the skin graft she did on the left medial ankle wound. Asked me to place Endoform on this. She told me she is not planning to do a subsequent skin graft on the right as the left one did not take very well. I had placed Hydrofera Blue on the right 03/13/16; continue to have a reasonably healthy wound on the right medial ankle. Down to 3 mm in terms of size. There is epithelialization here. The area on the left medial ankle is her skin graft site. I suppose the last week this looks somewhat better. She has an open area inferiorly however in the center there appears to be some viable tissue. There is a lot of surface callus and eschar that will eventually need to come off however none of this looked to be infected. Patient states that the is able to keep the dressing on for several days which is an  improvement. 03/20/16 no major change in the circumference of either wound however on the left side the patient was at Dr. Eusebio Friendly office and they did a debridement of left wound. 50% of the wound seems to be epithelialized. I been using Endoform on the left Hydrofera Blue in the right 03/27/16; she arrives today with her wound is not  looking as healthy as they did last week. The area on the right clearly has an adherent surface to this a very similar surface on the left. Unfortunately for this patient this is all too familiar problem. Clearly the Endoform is not working and will need to change that today that has some potential to help this surface. She does not tolerate debridement in this clinic very well. She is changing the dressing wants 04/03/16; patient arrives with the wounds looking somewhat better especially on the right. Dr. Migdalia Dk change the dressing to silver alginate when she saw her on Monday and also sold her some compression socks. The usefulness of the latter is really not clear and woman with severely draining wounds. 04/10/16; the patient is doing a bit of an experiment wearing the compression stockings that Dr. Migdalia Dk provided her to her left leg and the out of legs based dressings that we provided to the right. 05/01/16; the patient is continuing to wear compression stockings Dr. Migdalia Dk provided her on the left that are apparently silver impregnated. She has been using Iodoflex to the right leg wound. Still a moderate amount of drainage, when she leaves here the wraps only last for 4 days. She has to change the stocking on the left leg every night 05/15/16; she is now using compression stockings bilaterally provided by Dr. Marla Roe. She is wearing a nonadherent layer over the wounds so really I don't think there is anything specific being done to this now. She has some reduction on the left wound. The right is stable. I think all healing here is being done without a  specific dressing 06/09/16; patient arrives here today with not much change in the wound certainly in diameter to large circular wounds over the medial aspect of her ankle bilaterally. Under the light of these services are certainly not viable for healing. There is no evidence of surrounding infection. She is wearing compression stockings with some sort of silver impregnation as prescribed by Dr. Marla Roe. She has a follow-up with her tomorrow. 06/30/16; no major change in the size or condition of her wounds. These are still probably covered with a nonviable surface. She is using only her purchase stockings. She did see Dr. Marla Roe who seemed to want to apply Dakin's solution to this I'm not extreme short what value this would be. I would suggest Iodoflex which she still has at home. 07/28/16; I follow Mrs. Sessler episodically along with Dr. Marla Roe. 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 She has been applying a topical collagen ointment to both wounds with Adaptic. I don't think Dr. Marla Roe is planning to take her back to the OR. 08/19/16; I follow Mrs. Jeneen Rinks on a monthly basis along with Dr. Marla Roe of plastic surgery. 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 I been following her for a number of years. At one point I was able to get the right medial malleolus wound to heal and had the left medial malleolus down to about half its current size however and I had to send her to plastic surgery for an operative debridement. Since then things have been stable to slightly improve the area on the right is slightly better one in the left about the same although there is much less adherent surface than I'm used to with this patient. She is using some form of liquid collagen gel that Dr. Marla Roe provided a Kerlix  cover with the patient's own pressure stockings.  She tells me that she has extreme pain in both ankles and along the lateral aspect of both feet. She has been unable to work for some period of time. She is telling me she is retiring at the beginning of April. She sees Dr. Doran Durand of orthopedics next week 09/22/16; patient has not seen Dr. Marla Roe since the last time she is here. I'm not really sure what she is using to the wounds other than bits and pieces of think she had left over including most recently Hydrofera Blue. She is using juxtalite stockings. She is having difficulty with her husband's recent illness "stroke". She is having to transport him to various doctors appointments. Dr. Marla Roe left her the option of a repeat debridement with ACEL however she has not been able to get the time to follow-up on this. She continues to have a fair amount of drainage out of these wounds with certainly precludes leaving dressings on all week 10/13/16; patient has not seen Dr. Marla Roe since she was last in our clinic. I'm not really sure what she is doing with the wounds, we did try to get her [facility] and I think she is actually using this most of the time. Because of drainage she states she has to change this every second day although this is an improvement from what she used to do. She went to see Dr. Doran Durand who did not think she had a muscular issue with regards to her feet, he referred her to a neurologist and I think the appointment is sometime in June. I changed her back to Iodoflex which she has used in the past but not recently. 11/03/16; the patient has been using Iodoflex although she ran out of this. Still claims that there is a lot of drainage although the wound does not look like this. No surrounding erythema. She has not been back to see Dr. Marla Roe 11/24/16; the patient has been using Iodoflex again but she ran out of it 2 or 3 days ago. There is no major change in the condition of either one of these wounds in fact they are  larger and covered in a thick adherent surface slough/nonviable tissue especially on the left. She does not tolerate mechanical debridement in our clinic. Going back to see Dr. Marla Roe of plastic surgery for an operative debridement would seem reasonable. 12/15/16; the patient has not been back to see Dr. Marla Roe. She is been dealing with a series of illnesses and her husband which of monopolized her time. She is been using Sorbact which we largely supplied. She states the drainage is bad enough that it maximum she can go 2-3 days without changing the dressing 01/12/2017 -- the patient has not been back for about 4 weeks and has not seen Dr. Marla Roe not does she have any appointment pending. 01/23/17; patient has not seen Dr. Marla Roe even though I suggested this previously. She is using Santyl that was suggested last week by Dr. Con Memos this Cost her $16 through her insurance which is indeed surprising 02/12/17; continuing Santyl and the patient is changing this daily. A lot of drainage. She has not been back to see plastic surgery she is using an Ace wrap. Our intake nurse suggested wrap around stockings which would make a good reasonable alternative 02/26/17; patient is been using Santyl and changing this daily due to drainage. She has not been to see plastic surgery she uses in April Ace wrap to control the  edema. She did obtain extremitease stockings but stated that the edema in her leg was to big for these 03/20/17; patient is using Santyl and Anasept. Surfaces looked better today the area on the right is actually measuring a little smaller. She has states she has a lot of pain in her feet and ankles and is asking for a consult to pain control which I'll try to help her with through our case manager. 04/10/17; the patient arrives with better-looking wound surfaces and is slightly smaller wound on the left she is using a combination of Santyl and Anasept. She has an appointment or at least as  started in the pain control center associated with [facility] 05/14/17; this is a patient who I followed for a prolonged period of time. 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 At one point I had this down to a much smaller wound on the left however these reopened and we've never been able to get these to heal. She has been using Santyl and Anasept gel although 2 weeks ago she ran out of the Anasept gel. She has a stable appearance of the wound. She is going to the wound care clinic at [facility]. They wanted do a nerve block/spinal block although she tells me she is reluctant to go forward with that. 05/21/17; this is a patient I have followed for many years. 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 Chronic pain and deformity in her ankles as well. She is been to see plastic surgery as well as orthopedics. Using PolyMem AG most recently/Kerramax/ABDs and 2 layer compression. She has managed to keep this on and she is coming in for a nurse check to change the dressing on Tuesdays, we see her on Fridays 06/05/17; really quite a good looking surface and the area especially on the right medial has contracted in terms of dimensions. Well granulated healthy-looking tissue on both sides. Even with an open curet there is nothing that even feels abnormal here. This is as good as I've seen this in quite some time. We have been using PolyMem AG and bringing her in for a nurse check 06/12/17; really quite good surface on both of these wounds. The right medial has contracted a bit left is not. We've been using PolyMem and AG and she is coming in for a nurse visit 06/19/17; we have been using PolyMem AG and bringing her in for a nurse check. Dimensions of her wounds are not better but the surfaces looked better bilaterally. She complained of bleeding last night and the left wound and increasing pain bilaterally. She states her wound pain is more  neuropathic than just the wounds. There was some suggestion that this was radicular from her pain management doctor in talking to her it is really difficult to sort this out. 06/26/17; using PolyMem and AG and bringing her in for a nurse check as All of this and reasonably stable condition. Certainly not improved. The dimensions on the lateral part of the right leg look better but not really measuring better. The medial aspect on the left is about the same. 07/03/16; we have been using PolyMen AG and bringing her in for a nurse check to change the dressings as the wounds have drainage which precludes once weekly changing. We are using all secondary absorptive dressings.our intake nurse is brought up the idea of using a wound VAC/snap VAC on the wound to help with the drainage to see if this would  result in some contraction. This is not a bad idea. The area on the right medial is actually looking smaller. Both wounds have a reasonable-looking surface. There is no evidence of cellulitis. The edema is well controlled 07/10/17; the patient was denied for a snap VAC by her insurance. The major issue with these wounds continues to be drainage. We are using wicked PolyMem AG and she is coming in for a nurse visit to change this. The wounds are stable to slightly improved. The surface looks vibrant and the area on the right certainly has shrunk in size but very slowly 07/17/17; the patient still has large wounds on her bilateral medial malleoli. Surface of both of these wounds looks better. The dimensions seem to come and go but no consistent improvement. There is no epithelialization. We do not have options for advanced treatment products due to insurance issues. They did not approve of the wound VAC to help control the drainage. More recently we've been using PolyMem and AG wicked to allow drainage through. We have been bringing her in for a nurse visit to change this. We do not have a lot of options for wound  care products and the home again due to insurance issues 07/24/17; the patient's wound actually looks somewhat better today. No drainage measurements are smaller still healthy-looking surface. We used silver collagen under PolyMen started last week. We have been bringing her in for a dressing change 07/31/17; patient's wound surface continued to look better and I think there is visible change in the dimensions of the wound on the right. Rims of epithelialization. We have been using silver collagen under PolyMen and bringing her in for a dressing change. There appears to be less drainage although she is still in need of the dressing change 08/07/17. Patient's wound surface continues to look better on both sides and the area on the right is definitely smaller. We have been using silver collagen and PolyMen. She feels that the drainage has been it has been better. I asked her about her vascular status. She went to see Dr. Aleda Grana at [facility] and had some form of ablation. I don't have much detail on this. I haven't my notes from 2016 that she was not a candidate for any further ablation but I don't have any more information on this. We had referred her to vein and vascular I don't think she ever went. He does not have a history of PAD although I don't have any information on this either. We don't even have ABIs in our record 08/14/17; we've been using silver collagen and PolyMen cover. And putting the patient and compression. She we are bringing her in as a nurse visit to change this because ofarge amount of drainage. We didn't the ABIs in clinic today since they had been done in many moons 1.2 bilaterally. She has been to see vein and vascular however this was at [facility] and she had ablation although I really don't have any information on this all seemed biking get a report. She is also been operatively debrided by plastic surgery and had a cell placed probably 8-12 months ago. This didn't have  a major effect. We've been making some gains with current dressings 08/19/17-She is here in follow-up evaluation for bilateral medial malleoli ulcers. She continues to tolerate debridement very poorly. We will continue with recently changed topical treatment; if no significant improvement may consider switching to Iodosorb/Iodoflex. 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 She has been using silver collagen and PolyMem. I believe she has been used and tried on Iodoflex before. 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 These have been very refractory over time. We have been using silver collagen and PolyMen. She is coming in once a week for a doctor's and once a week for nurse visits. We are actually making some progress 09/18/17; the patient's wounds are smaller especially on the right medial. She arrives today to upset to consider even washing these off with Anasept which I think is been part of the reason this is been closing. We've been using collagen covered in PolyMen otherwise. It is noted that she has a small area of folliculitis on the right medial calf that. As we are wrapping her legs I'll give her a short course of doxycycline to make sure this doesn't amount to anything. She is a long list of complaints today including imbalance, shortness of breath on exertion, inversion of her left ankle. With regards to the latter complaints she is been to see orthopedics and they offered her a tendon release surgery I believe but wanted her wounds to be closed first. I have recommended she go see her primary physician with regards to everything else. 09/25/17; patient's wounds are about the same size. We have made some progress bilaterally although not in recent  weeks. She will not allow me T wash these wounds with Anasept even if she is doing her cell. Wheeze we've been using collagen covered in PolyMen. Last week she had a small area of folliculitis this is now opened into a small wound. She completed 5 days of trimethoprim sulfamethoxazole 10/02/17; unfortunately the area on her left medial ankle is worse with a larger wound area towards the Achilles. The patient complains of a lot of pain. She will not allow debridement although visually I don't think there is anything to debridement in any case. We have been using silver collagen and PolyMen for several months now. Initially we are making some progress although I'm not really seeing that today. We will move back to [facility]. His admittedly this is a bit of a repeat however I'm hoping that his situation is different now. The patient tells me she had her leg on the left give out on her yesterday this is process some pain. 10/09/17; the patient is seen twice a week largely because of drainage issues coming out of the chronic medial bimalleolar wounds that are chronic. Last week the dimensions of the one on the left looks a little larger I changed her to [facility]. She comes in today with a history of terrible pain in the bilateral wound areas. She will not allow debridement. She will not even allow a tissue culture. There is no surrounding erythema no no evidence of cellulitis. We have been putting her Kerlix Coban man. She will not allow more aggressive compression as there was a suggestion to put her in 3 layer wraps. 10/16/17; large wounds on her bilateral medial malleoli. These are chronic. Not much change from last week. The surface looks have healthy but absolutely no epithelialization. A lot of pain little less so of drainage. She will not allow debridement or even washing these off in the vigorous fashion with Anasept. 10/23/17; large wounds on her bilateral malleoli which are  chronic. Some  improvement in terms of size perhaps on the right since last time I saw these. She states that after we increased the 3 layer compression there was some bleeding, when she came in for a nurse visit she did not want 3 layer compression put back on about our nurse managed to convince her. She has known chronic venous visit issues and I'm hoping to get her to tolerate the 3 layer compression. using Hydrofera Blue 10/30/17; absolutely no change in the condition of either wound although we've had some improvement in dimensions on the right.. Attempted to put her in 3 layer compression she didn't tolerated she is back in 2 layer compression. We've been using Hydrofera Blue We looked over her past records. She had venous reflux studies in November 2016. There was no evidence of deep venous reflux on the right. Superficial vein did not show the greater saphenous vein at think this is been previously ablated the small saphenous vein was within normal limits. The left deep venous system showed no DVT the vessels were positive for deep venous reflux in the posterior tibial veins at the ankle. The greater saphenous vein was surgically absent small saphenous vein was within normal limits. She went to vein and vascular at [facility]. I believe she had an ablation on the left greater saphenous vein. I'll update her reflux studies perhaps ever reviewed by vein and vascular. We've made absolutely no progress in these wounds. Will also try to read and TheraSkins through her insurance 11/06/17; W the patient apparently has a 2 week follow-up with vein and vascular I like him to review the whole issue with regards to her previous vascular workup by Dr. Aleda Grana. We've really made no progress on these wounds in many months. She arrives today with less viable looking surface on the left medial ankle wound. This was apparently looking about the same on Tuesday when she was here for nurse visit. 11/13/17; deep tissue  culture I did last time of the left lower leg showed multiple organisms without any predominating. In particular no Staphylococcus or group A strep were isolated. We sent her for venous reflux studies. She's had a previous left greater saphenous vein stripping and I think sclerotherapy of the right greater saphenous vein. She didn't really look at the lesser saphenous vein this both wounds are on the medial aspect. She has reflux in the common femoral vein and popliteal vein and an accessory vein on the right and the common femoral vein and popliteal vein on the left. I'm going to have her go to see vein and vascular just the look over things and see if anything else beside aggressive compression is indicated here. We have not been able to make any progress on these wounds in spite of the fact that the surface of the wounds is never look too bad. 11/20/17; no major change in the condition of the wounds. Patient reports a large amount of drainage. She has a lot of complaints of pain although enlisting her today I wonder if some of this at least his neuropathic rather than secondary to her wounds. She has an appointment with vein and vascular on 12/30/17. The refractory nature of these wounds in my mind at least need vein and vascular to look over the wounds the recent reflux studies we did and her history to see if anything further can be done here. I also note her gait is deteriorated quite a bit. Looks like she has inversion of her  foot on the right. She has a bilateral Trendelenburg gait. I wonder if this is neuropathic or perhaps multilevel radicular. 11/27/17; her wounds actually looks slightly better. Healthy-looking granulation tissue a scant amount of epithelialization. [place] healthcare will not pay for [employer]. They will play for tri layer Oasis and Dermagraft. This is not a diabetic ulcer. We'll try for the tri layer Oasis. She still complains of some drainage. She has a vein and vascular  appointment on 12/30/17 12/04/17; the wounds visually look quite good. Healthy-looking granulation with some degree of epithelialization. We are still waiting for response to our request for trial to try layer Oasis. Her appointment with vascular to review venous and arterial issues isn't sold the end of July 7/31. Not allow debridement or even vigorous cleansing of the wound surface. 12/18/17; slightly smaller especially on the right. 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 We've been using Hydrofera Blue. We still are looking into triple layer Oasis through her insurance 01/08/18 on evaluation today patient's wound actually appears to be showing signs of good improvement at this point in time. She has been tolerating the dressing changes without complication. Fortunately there does not appear to be any evidence of infection at this point in time. We have been utilizing silver nitrate which does seem to be of benefit for her which is also good news. Overall I'm very happy with how things seem to be both regards appearance as well as measurement. Patient did see Dr. Bridgett Larsson for evaluation on 12/30/17. In his assessment he felt that stripping would not likely add much more than chronic compression to the patient's healing process. His recommendation was to follow-up in three months with Dr. Doren Custard if she hasn't healed in order to consider referral back to you and see vascular where she previously was in a trial and was able to get her wound to heal. I'll be see what she feels she when you staying compression and he reiterated this as well. 01/13/18 on evaluation today patient appears to actually be doing very well in regard to her bilateral medial malleolus ulcers. She seems to have tolerated the chemical cauterization with silver nitrate last week she did have some pain through that evening but fortunately states that I'll be see since it seems to be doing better she is overall  pleased with the progress. 01/21/18; really quite a remarkable improvement since I've last seen these wounds. We started using silver nitrate specially on the islands of hyper granulation which for some reason her around the wound circumference. This is really done quite nicely. Primary dressing Hydrofera Blue under 4 layer compression. She seems to be able to hold out without a nurse rewrap. Follow-up in 1 week 01/28/18; we've continued the hydrofera blue but continued with chemical cauterization to the wound area that we started about a month ago for irregular hyper granulation. She is made almost stunning improvement in the overall wound dimensions. I was not really expecting this degree of improvement in these chronic wounds 02/05/18; we continue with Hydrofera Bluebut of also continued the aggressive chemical cauterization with silver nitrate. We made nice progress with the right greater than left wound. 02/12/18. We continued with Hydrofera Blue after aggressive chemical cauterization with silver nitrate. We appear to be making nice progress with both wound areas 02/19/2018; we continue with [facility] after washing the wounds vigorously with Anasept spray and chemical cauterization with silver nitrate. We are making excellent progress. The area on the right's just about closed 02/26/2018.  The area on the left medial ankle had too much necrotic debris today. I used a #5 curette we are able to get most of the soft. I continued with the silver nitrate to the much smaller wound on the right medial ankle she had a new area on her right lower pretibial area which she says was due to a role in her compression 03/05/2018; both wound areas look healthy. Not much change in dimensions from last week. I continue to use silver nitrate and Hydrofera Blue. The patient saw Dr. Doren Custard of vein and vascular. He felt she had venous stasis ulcers. He felt based on her previous arteriogram she should have  adequate circulation for healing. Also she has deep venous reflux but really no significant correctable superficial venous reflux at this time. He felt we should continue with conservative management including leg elevation and compression 04/02/2018; since we last saw this woman about a month ago she had a fall apparently suffered a pelvic fracture. I did not look up the x-ray. Nevertheless because of pain she literally was bedbound for 2 weeks and had home health coming out to change the dressing. Somewhat predictably this is resulted in considerable improvement in both wound areas. The right is just about closed on the medial malleolus and the left is about half the size. 04/16/2018; both her wounds continue to go down in size. Using Hydrofera Blue. 05/07/18; both her wounds appeared to be improving especially on the right where it is almost closed. We are using Hydrofera Blue 05/14/2018; slightly worse this week with larger wounds. Surface on the left medial not quite as good. We have been using Hydrofera Blue 05/21/18; again the wounds are slightly larger. Left medial malleolus slightly larger with eschar around the circumference. We have been using Hydrofera Blue undergoing a wraps for a prolonged period of time. This got a lot better when she was more recumbent due to a fall and a back injury. I change the primary dressing the silver alginate today. She did not tolerate a 4 layer compression previously although I may need to bring this up with her next time 05/28/2018; area on the left medial malleolus again is slightly larger with more drainage. Area on the right is roughly unchanged. She has a small area of folliculitis on the right medial just on the lower calf. This does not look ominous. 06/03/2018 left medial malleolus slightly smaller in a better looking surface. We used silver nitrate on this last time with silver alginate. The area on the right appears slightly smaller 1/10; left medial  malleolus slightly smaller. Small open area on the right. We used silver nitrate and silver alginate as of 2 weeks ago. We continue with the wound and compression. These got a lot better when she was off her feet 1/17; right medial malleolus wound is smaller. The left may be slightly smaller. Both surfaces look somewhat better. 1/24; both wounds are slightly smaller. Using silver alginate under Unna boots 1/31; both wounds appear smaller in fact the area on the right medial is just about closed. Surface eschar. We have been using silver alginate under Unna boots. The patient is less active now spends let much less time on her feet and I think this is contributed to the general improvement in the wound condition 2/7; both wounds appear smaller. I was hopeful the right medial would be closed however there there is still the same small open area. Slight amount of surface eschar on the left the dimensions  are smaller there is eschar but the wound edges appear to be free. We have been using silver alginate under Unna boot's 2/14; both wounds once again measure smaller. Circumferential eschar on the left medial. We have been using silver alginate under Unna boots with gradual improvement 2/21; the area on the right medial malleolus has healed. The area on the left is smaller. We have been using silver alginate and Unna boots. We can discharge wrapping the right leg she has 20/30 stockings at home she will need to protect the scar tissue in this area 2/28; the area on the right medial malleolus remains closed the patient has a compression stocking. The area on the left is smaller. We have been using silver alginate and Unna boots. 3/6 the area on the right medial ankle remains closed. Good edema control noted she is using her own compression stocking. The area on the left medial ankle is smaller. We have been managing this with silver alginate and Unna boots which we will continue today. 3/13; the area on  the right medial ankle remains closed and I'm declaring it healed today. When necessary the left is about the same still a healthy-looking surface but no major change and wound area. No evidence of infection and using silver alginate under unna and generally making considerable improvement 3/27 the area on the right medial ankle remains closed the area on the left is about the same as last week. Certainly not any worse we have been using silver alginate under an Unna boot 4/3; the area on the right medial ankle remains closed per the patient. We did not look at this wound. The wound on the left medial ankle is about the same surface looks healthy we have been using silver alginate under an Unna boot 4/10; area on the right medial ankle remains closed per the patient. We did not look at this wound. The wound on the left medial ankle is slightly larger. The patient complains that the [facility] caused burning pain all week. She also told us that she was a lot more active this week. Changed her back to silver alginate 4/17; right medial ankle still closed per the patient. Left medial ankle is slightly larger. Using silver alginate. She did not tolerate Hydrofera Blue on this area 4/24; right medial ankle remains closed we have not look at this. The left medial ankle continues to get larger today by about a centimeter. We have been using silver alginate under Unna boots. She complains about 4 layer compression as an alternative. She has been up on her feet working on her garden 5/8; right medial ankle remains closed we did not look at this. The left medial ankle has increased in size about 100%. We have been using silver alginate under Unna boots. She noted increased pain this week and was not surprised that the wound is deteriorated 5/15; no major change in SA however much less erythema ( one week of doxy ocellulitis). 5/22-64 year old female returns at 1 week to the clinic for left medial ankle  wound for which we have been using silver alginate under 3 layer compression She was placed on DOXY at last visit - the wound is wider at this visit. She is in 3 layer compression 5/29; change to [facility] last week. I had given her empiric doxycycline 2 weeks ago for a week. She is in 3 layer compression. She complains of a lot of pain and drainage on presentation today. 6/5; using Hydrofera Blue. I  gave her doxycycline recently empirically for erythema and pain around the wound. Believe her cultures showed enterococcus which not would not have been well covered by doxycycline nevertheless the wound looks better and I don't feel specifically that the enterococcus needs to be covered. She has a new what looks like a wrap injury on her lateral left ankle. 6/12; she is using Hydrofera Blue. She has a new area on the left anterior lower tibial area. This was a wrap injury last week. 6/19; the patient is using Hydrofera Blue. She arrived with marked inflammation and erythema around the wound and tenderness. 12/01/18 on evaluation today patient appears to be doing a little bit better based on what I'm hearing from the standpoint of lassos evaluation to this as far as the overall appearance of the wound is concerned. Then sometime substandard she typically sees Dr. Dellia Nims. Nonetheless overall very pleased with the progress that she's made up to this point. No fevers, chills, nausea, or vomiting noted at this time. 7/10; some improvement in the surface area. Aggressively debrided last week apparently. I went ahead with the debridement today although the patient does not tolerate this very well. We have been using Iodoflex. Still a fair amount of drainage 7/17; slightly smaller. Using Iodoflex. 7/24; no change from last week in terms of surface area. We have been using Iodoflex. Surface looks and continues to look somewhat better 7/31; surface area slightly smaller better looking surface. We have been  using Iodoflex. This is under Unna boot compression 8/7-Patient presents at 1 week with Unna boot and Iodoflex, wound appears better 8/14-Patient presents at 1 week with Iodoflex, we use the Unna boot, wound appears to be stable better.Patient is getting Botox treatment for the inversion of the foot for tendon release, Next week 8/21; we are using Iodoflex. Unna boot. The wound is stable in terms of surface area. Under illumination there is some areas of the wound that appear to be either epithelialized or perhaps this is adherent slough at this point I was not really clear. It did not wipe off and I was reluctant to debride this today. 8/28; we are using Iodoflex in an Unna boot. Seems to be making good improvement. 9/4; using Iodoflex and wound is slightly smaller. 9/18; we are using Iodoflex with topical silver nitrate when she is here. The wound continues to be smaller 10/2; patient missed her appointment last week due to GI issues. She left and Iodoflex based dressing on for 2 weeks. Wound is about the same size about the size of a dime on the left medial lower 10/9 we have been using Iodoflex on the medial left ankle wound. She has a new superficial probable wrap injury on the dorsal left ankle 10/16; we have been using Hydrofera Blue since last week. This is on the left medial ankle 10/23; we have been using Hydrofera Blue since 2 weeks ago. This is on the left medial ankle. Dimensions are better 11/6; using Hydrofera Blue. I think the wound is smaller but still not closed. Left medial ankle 11/13; we have been using Hydrofera Blue. Wound is certainly no smaller this week. Also the surface not as good. This is the remanent of a very large area on her left medial ankle. 11/20; using Sorbact since last week. Wound was about the same in terms of size although I was disappointed about the surface debris 12/11; 3-week follow-up. Patient was on vacation. Wound is measuring slightly larger we have  been using Sorbact. 12/18;  wound is about the same size however surface looks better last week after debridement. We have been using Sorbact under compression 1/15 wound is probably twice the size of last time increased in length nonviable surface. We have been using Sorbact. She was running a mild fever and missed her appointment last week 1/22; the wound is come down in size but under illumination still a very adherent debris we have been Hydrofera Blue that I changed her to last week 1/29; dimensions down slightly. We have been using Hydrofera Blue 2/19 dimensions are the same however there is rims of epithelialization under illumination. Therefore more the surface area may be epithelialized 2/26; the patient's wound actually measures smaller. The wound looks healthy. We have been using Hydrofera Blue. I had some thoughts about running Apligraf then I still may do that however this looks so much better this week we will delay that for now 3/5; the wound is small but about the same as last week. We have been using Hydrofera Blue. No debridement is required today. 3/19; the wound is about the size of a dime. Healthy looking wound even under illumination. We have been using Hydrofera Blue. No mechanical debridement is necessary 3/26; not much change from last week although still looks very healthy. We have been using Hydrofera Blue under Unna boots Patient was offered an ankle fusion by podiatry but not until the wound heals with a proceed with this. 4/9; the patient comes in today with her original wound on the medial ankle looking satisfactory however she has some uncontrolled swelling in the middle part of her leg with 2 new open areas superiorly just lateral to the tibia. I think this was probably a wrap issue. She said she felt uncomfortable during the week but did not call in. We have been using Hydrofera Blue 4/16; the wound on the medial ankle is about the same. She has innumerable small  areas superior to this across her mid tibia. I think this is probably folliculitis. She is also been working in the yard doing a lot of sweating 4/30; the patient issue on the upper areas across her mid tibia of all healed. I think this was excessive yard work if I remember. Her wound on the medial ankle is smaller. Some debris on this we have been using Hydrofera Blue under Unna boots 5/7; mid tibia. She has been using Hydrofera Blue under an Unna wrap. She is apparently going for her ankle surgery on June 3 10/28/19-Patient returns to clinic with the ankle wound, we are using Hydrofera Blue under Unna wrap, surgery is scheduled for her left foot for June 3 so she will be back for nurse visit next week READMISSION 01/17/2020 [name] is a 64 year old woman we have had in this clinic for a long period of time with severe venous hypertension and refractory wounds on her medial lower legs and ankles bilaterally. 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 When she was off her legs for a prolonged period example when she fell and suffered a compression fracture things would heal up quite nicely. She is now retired and we managed to heal up the right medial leg wound. The left one was very tiny last time I saw this although still refractory. 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 It got to the point where this was interfering with her walking and she elected to proceed with a ankle arthrodesis to straighten her her ankle and leave her with a functional outcome for mobilization. The patient was referred to Dr. Doren Custard and really this took some time to arrange. Dr. Doren Custard saw her on 12/07/2019. Once again he verified that she had no arterial issues. She had previously had an angiogram several  years ago. Follow-up ABIs on the left showed an ABI of 1.12 with triphasic waveforms and a TBI of 0.92. She is felt to have chronic deep venous insufficiency but I do not think it was felt that anything could be done from about this from an ablation point of view. At the time Dr. Doren Custard saw this patient the wounds actually look closed via the pictures in his clinic. The patient finally underwent her surgery on 12/15/2019. This went reasonably well and there was a good anatomic outcome. She developed a small distal wound dehiscence on the lateral part of the surgical wound. However more problematically she is developed recurrence of the wound on the medial left ankle. There are actually 2 wounds here one in the distal lower leg and 1 pretty much at the level of the medial malleolus. It is a more distal area that is more problematic. She has been using Hydrofera Blue which started on Friday before this she was simply Ace wrapping. There was a culture done that showed Pseudomonas and she is on ciprofloxacin. A recent CNS on 8/11 was negative. The patient reports some pain but I generally think this is improving. She is using a cam boot completely nonweightbearing using a walker for pivot transfers and a wheelchair 8/24; not much improvement unfortunately she has a surgical wound on the lateral part in the venous insufficiency wound medially. The bottom part of the medial insufficiency wound is still necrotic there is exposed tendon here. We have been using Hydrofera Blue under compression. Her edema control is however better 8/31; patient in for follow-up of his surgical wound on the lateral part of her left leg and chronic venous insufficiency ulcers medially. We put her back in compression last week. She comes in today with a complaint of 3 or 4 days worth of increasing pain. She felt her cam walker was rubbing on the area on the back of her heel. However there is intense erythema seems more likely she  has cellulitis. She had 2 cultures done when she was seeing podiatry in the postop. One of them in late July showed Pseudomonas and she received a course of ciprofloxacin the other was negative on 8/11 she is allergic to penicillin with anaphylactoid complaints of hives oral swelling via information in epic Electronic Signature(s) Signed: 01/31/2020 5:08:08 PM By: Linton Ham MD Entered By: Linton Ham on 01/31/2020 13:06:50 -------------------------------------------------------------------------------- Physical Exam Details Patient Name: Date of Service: [name], [name] 01/31/2020 10:30 A M Medical Record Number: [id number] Patient Account Number: [id number] Date of Birth/Sex: Treating RN: 03-03-1956 (64 y.o. [name] Primary Care Provider: [name] Other Clinician: Referring Provider: Treating Provider/Extender: [name] in Treatment: 2 Constitutional Sitting or standing Blood Pressure is within target range for patient.. Pulse regular and within target range for patient.Marland Kitchen Respirations regular, non-labored and within target range.. Temperature is normal and within the target range for the patient.Marland Kitchen Appears in no distress. Cardiovascular Pedal pulses are palpable. Integumentary (Hair, Skin) There is intense erythema in the  posterior heel. Notes Wound exam; left medial lower leg the wounds look the same however superiorly I think there is new open areas. The biggest concern this week is intense erythema in the posterior heel extending laterally. She is even tender over the plantar heel although I do not see any erythema here. There is no purulent drainage. I did not think there was anything I could culture with any expectation the that being helpful. Electronic Signature(s) Signed: 01/31/2020 5:08:08 PM By: Linton Ham MD Entered By: Linton Ham on 01/31/2020  13:08:14 -------------------------------------------------------------------------------- Physician Orders Details Patient Name: Date of Service: [name], [name] 01/31/2020 10:30 A M Medical Record Number: [id number] Patient Account Number: [id number] Date of Birth/Sex: Treating RN: May 07, 1956 (64 y.o. [name] Primary Care Provider: [name] Other Clinician: Referring Provider: Treating Provider/Extender: [name] in Treatment: 2 Verbal / Phone Orders: No Diagnosis Coding ICD-10 Coding Code Description [phone] Chronic venous hypertension (idiopathic) with ulcer and inflammation of left lower extremity L97.828 Non-pressure chronic ulcer of other part of left lower leg with other specified severity L97.328 Non-pressure chronic ulcer of left ankle with other specified severity T81.31XD Disruption of external operation (surgical) wound, not elsewhere classified, subsequent encounter Follow-up Appointments Return Appointment in 1 week. Dressing Change Frequency Change dressing every day. Skin Barriers/Peri-Wound Care Moisturizing lotion Wound Cleansing May shower with protection. - use cast protector Primary Wound Dressing Wound #14 Left,Lateral Ankle Hydrofera Blue - Classic Wound #15 Left,Medial Malleolus Hydrofera Blue - Classic Wound #16 Left,Medial Lower Leg Hydrofera Blue - Classic Secondary Dressing Wound #14 Left,Lateral Ankle Kerlix/Rolled Gauze Dry Gauze Wound #15 Left,Medial Malleolus Kerlix/Rolled Gauze Dry Gauze ABD pad Wound #16 Left,Medial Lower Leg Kerlix/Rolled Gauze Dry Gauze ABD pad Edema Control Avoid standing for long periods of time Elevate legs to the level of the heart or above for 30 minutes daily and/or when sitting, a frequency of: - throughout the day Off-Loading Removable cast walker boot to: - left foot Home Health dmit to [facility] for [facility] X-ray,  ankle LEFT - NON HEALING WOUND WITH PURLENT DRAINAGE - (ICD10 L97.328 - Non-pressure chronic ulcer of left ankle with other specified severity) X-ray, foot, LEFT - NON HEALING WOUND WITH PURLENT DRAINAGE - (ICD10 V42.595 - Non-pressure chronic ulcer of other part of left lower leg with other specified severity) Patient Medications llergies: penicillin, doxycycline A Notifications Medication Indication Start End wound infection 01/31/2020 Levaquin DOSE oral 500 mg tablet - 1 tablet oral qd for 7 days wound infection 01/31/2020 sulfamethoxazole-trimethoprim DOSE oral 800 mg-160 mg tablet - 1 tablet oral bid for 7 days Electronic Signature(s) Signed: 01/31/2020 11:49:33 AM By: Linton Ham MD Entered By: Linton Ham on 01/31/2020 11:49:32 Prescription 01/31/2020 -------------------------------------------------------------------------------- Charolotte Capuchin MD Patient Name: Provider: [birth year] [id number] Date of Birth: NPI#Jesse Sans Sex: DEA #: [phone] Phone #: License #: [place] Patient Address: [address] [place], [place] [address] [address] [place], [place] [address] [phone] Allergies penicillin; doxycycline Provider's Orders X-ray, ankle LEFT - ICD10: L97.328 - NON HEALING WOUND WITH PURLENT DRAINAGE Hand Signature: Date(s): Prescription 01/31/2020 Charolotte Capuchin MD Patient Name: Provider: 1955/06/26 [id number] Date of Birth: NPI#Wanda Plump [id number] Sex: DEA #: [phone] Phone #: License #: [place] Patient Address: [place], [place] [address] [address]  [place], [place] [address] [phone] Allergies penicillin; doxycycline Provider's Orders X-ray, foot, LEFT - ICD10: T41.962 - NON HEALING WOUND WITH PURLENT DRAINAGE Hand Signature: Date(s): Electronic  Signature(s) Signed: 01/31/2020 5:08:08 PM By: Linton Ham MD Entered By: Linton Ham on 01/31/2020 11:49:35 -------------------------------------------------------------------------------- Problem List Details Patient Name: Date of Service: [name], [name] 01/31/2020 10:30 A M Medical Record Number: [id number] Patient Account Number: [id number] Date of Birth/Sex: Treating RN: 10-20-1955 (64 y.o. [name] Primary Care Provider: [name] Other Clinician: Referring Provider: Treating Provider/Extender: [name] in Treatment: 2 Active Problems ICD-10 Encounter Code Description Active Date MDM Diagnosis I87.332 Chronic venous hypertension (idiopathic) with ulcer and inflammation of left 01/17/2020 No Yes lower extremity L97.828 Non-pressure chronic ulcer of other part of left lower leg with other specified 01/17/2020 No Yes severity L03.116 Cellulitis of left lower limb 01/31/2020 No Yes L97.328 Non-pressure chronic ulcer of left ankle with other specified severity 01/17/2020 No Yes T81.31XD Disruption of external operation (surgical) wound, not elsewhere classified, 01/17/2020 No Yes subsequent encounter Inactive Problems Resolved Problems Electronic Signature(s) Signed: 01/31/2020 5:08:08 PM By: Linton Ham MD Entered By: Linton Ham on 01/31/2020 13:05:00 -------------------------------------------------------------------------------- Progress Note Details Patient Name: Date of Service: [name], [name] 01/31/2020 10:30 A M Medical Record Number: [id number] Patient Account Number: [id number] Date of Birth/Sex: Treating RN: [birth year] (64 y.o. [name] Primary Care Provider: [name] Other Clinician: Referring Provider: Treating Provider/Extender: [name] in Treatment: 2 Subjective History of Present Illness (HPI) the remaining wound is over the left medial ankle. Similar  wound over the right medial ankle healed largely with use of Apligraf. Most recently we have been using Hydrofera Blue over this wound with considerable improvement. The patient has been extensively worked up in the past for her venous insufficiency and she is not a candidate for antireflux surgery although I have none of the details available currently. 08/24/14; considerable improvement today. About 50% of this wound areas now epithelialized. The base of the wound appears to be healthier granulation.as opposed to last week when she had deteriorated a considerable improvement 08/17/14; unfortunately the wound has regressed somewhat. The areas of epithelialization from the superior aspect are not nearly as healthy as they were last week. The patient thinks her Hydrofera Blue slipped. 09/07/14; unfortunately the area has markedly regressed in the 2 weeks since I've seen this. There is an odor surrounding erythema. The healthy granulation tissue that we had at the base of the wound now is a dusky color. The nurse reports green drainage 09/14/14; the area looks somewhat better than last week. There is less erythema and less drainage. The culture I did did not show any growth. Nevertheless I think it is better to continue the Cipro and doxycycline for a further week. The remaining wound area was debridement. 09/21/14. Wound did not require debridement last week. Still less erythema and less drainage. She can complete her antibiotics. The areas of epithelialization in the superior aspect of the wound do not look as healthy as they did some weeks ago 10/05/14 continued improvement in the condition of this wound. There is advancing epithelialization. Less aggressive debridement required 10/19/14 continued improvement in the condition and volume of this wound. Less aggressive debridement to the inferior part of this to remove surface slough and fibrinous eschar 11/02/14 no debridement is required. The surface  granulation appears healthy although some of her islands of epithelialization seem to have regressed.  No evidence of infection 11/16/14; lites surface debridement done of surface eschar. The wound does not look to be unhealthy. No evidence of infection. Unfortunately the patient has had podiatry issues in the right foot and for some reason has redeveloped small surface ulcerations in the medial right ankle. Her original presentation involved wounds in this area 11/23/14 no debridement. The area on the right ankle has enlarged. The left ankle wound appears stable in terms of the surface although there is periwound inflammation. There has been regression in the amount of new skin 11/30/14 no debridement. Both wound areas appear healthy. There was no evidence of infection. The the new area on the right medial ankle has enlarged although that both the surfaces appear to be stable. 12/07/14; Debridement of the right medial ankle wound. No no debridement was done on the left. 12/14/14 no major change in and now bilateral medial ankle wounds. Both of these are very painful but the no overt evidence of infection. She has had previous venous ablation 12/21/14; patient states that her right medial ankle wound is considerably more painful last week than usual. Her left is also somewhat painful. She could not tolerate debridement. The right medial ankle wound has fibrinous surface eschar 12/28/14 this is a patient with severe bilateral venous insufficiency ulcers. For a considerable period of time we actually had the one on the right medial ankle healed however this recently opened up again in June. The left medial ankle wound has been a refractory area with some absent flows. We had some success with Hydrofera Blue on this area and it literally closed by 50% however it is recently opened up Foley. Both of these were debridement today of surface eschar. She tolerates this poorly 01/25/15: No change in the status of  this. Thick adherent escar. Very poor tolerance of any attempt at debridement. I had healed the right medial malleolus wound for a considerable amount of time and had the left one down to about 50% of the volume although this is totally regressed over the last 48 weeks. Further the right leg has reopened. she is trying to make a appointment with pain and vascular, previous ablations with Dr. Aleda Grana. I do not believe there is an arterial insufficiency issue here 02/01/15 the status of the adherent eschar bilaterally is actually improved. No debridement was done. She did not manage to get vascular studies done 02/08/15 continued debridement of the area was done today. The slough is less adherent and comes off with less pressure. There is no surrounding infection peripheral pulses are intact 02/15/15 selective debridement with a disposable curette. Again the slough is less adherent and comes off with less difficulty. No surrounding infection peripheral pulses are intact. 02/22/15 selective debridement of the right medial ankle wound. Slough comes off with less difficulty. No obvious surrounding infection peripheral pulses are intact I did not debridement the one on the left. Both of these are stable to improved 03/01/15 selective debridement of both wound areas using a curette to. Adherent slough cup soft with less difficulty. No obvious surrounding infection. The patient tells me that 2 days ago she noted a rash above the right leg wrap. She did not have this on her lower legs when she change this over she arrives with widespread left greater than right almost folliculitis-looking rash which is extremely pruritic. I don't see anything to culture here. There is no rash on the rest of her body. She feels well systemically. 03/08/15; selective debridement of both wounds using  a curette. Base of this does not look unhealthy. She had limegreen drainage coming out of the left leg wound and describes a lot of  drainage. The rash on her left leg looks improved to. No cultures were done. 03/22/15; patient was not here last week. Basal wounds does not look healthy and there is no surrounding erythema. No drainage. There is still a rash on the left leg that almost looks vasculitic however it is clearly limited to the top of where the wrap would be. 04/05/15; on the right required a surgical debridement of surface eschar and necrotic subcutaneous tissue. I did not debridement the area on the left. These continue to be large open wounds that are not changing that much. We were successful at one point in healing the area on the right, and at the same time the area on the left was roughly half the size of current measurements. I think a lot of the deterioration has to do with the prolonged time the patient is on her feet at work 04/19/15 I attempted-like surface debridement bilaterally she does not tolerate this. She tells me that she was in allergic care yesterday with extreme pain over her left lateral malleolus/ankle and was told that she has an "sprain" 05/03/15; large bilateral venous insufficiency wounds over the medial malleolus/medial aspect of her ankles. She complains of copious amounts of drainage and his usual large amounts of pain. There is some increasing erythema around the wound on the right extending into the medial aspect of her foot to. historically she came in with these wounds the right one healed and the left one came down to roughly half its current size however the right one is reopened and the left is expanded. 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 05/10/15 large bilateral venous insufficiency wounds. There is less adherence surface left however the surface culture that I did last week grew pseudomonas therefore bilateral selective debridement score necessary. There is surrounding erythema. The patient describes severe bilateral drainage  and a lot of pain in the left ankle. Apparently her podiatrist was were ready to do a cortisone shot 05/17/15; the patient complains of pain and again copious amounts of drainage. 05/24/15; we used Iodo flex last week. Patient notes considerable improvement in wound drainage. Only needed to change this once. 05/31/15; we continued Iodoflex; the base of these large wounds bilaterally is not too bad but there is probably likely a significant bioburden here. I would like to debridement just doesn't tolerate it. 06/06/14 I would like to continue the Iodoflex although she still hasn't managed to obtain supplies. She has bilateral medial malleoli or large wounds which are mostly superficial. Both of them are covered circumferentially with some nonviable fibrinous slough although she tolerates debridement very poorly. She apparently has an appointment for an ablation on the right leg by interventional radiology. 06/14/15; the patient arrives with the wounds and static condition. We attempted a debridement although she does not do well with this secondary to pain. I 07/05/15; wounds are not much smaller however there appears to be a cleaner granulating base. The left has tight fibrinous slough greater than the right. Debridement is tolerated poorly due to pain. Iodoflex is done more for these wounds in any of the multitude of different dressings I have tried on the left 1 and then subsequently the right. 07/12/15; no change in the condition of this wound. I am able  to do an aggressive debridement on the right but not the left. She simply cannot tolerate it. We have been using Iodoflex which helps somewhat. It is worthwhile remembering that at one point we healed the right medial ankle wound and the left was about 25% of the current circumference. We have suggested returning to vascular surgery for review of possible further ablations for one reason or another she has not been able to do this. 07/26/15 no major  change in the condition of either wound on her medial ankle. I did not attempt to debridement of these. She has been aggressively scrubbing these while she is in the shower at home. She has her supply of Iodoflex which seems to have done more for these wounds then anything I have put on recently. 08/09/15 wound area appears larger although not verified by measurements. Using Iodoflex 09/05/2015 -- she was here for avisit today but had significant problems with the wound and I was asked to see her for a physician opinion. I have summarize that this lady has had surgery on her left lower extremity about 10 years ago where the possible veins stripping was done. She has had an opinion from interventional radiology around November 2016 where no further sclerotherapy was ordered. The patient works 12 hours a day and stands on a concrete floor with work boots and is unable to get the proper compression she requires and cannot elevate her limbs appropriately at any given time. She has recently grown Pseudomonas from her wound culture but has not started her ciprofloxacin which was called in for her. 09/13/15 this continues to be a difficult situation for this patient. At one point I had this wound down to a 1.5 x 1.5" wound on her left leg. This is deteriorated and the right leg has reopened. She now has substantial wounds on her medial calcaneus, malleoli and into her lower leg. One on the left has surface eschar but these are far too painful for me to debridement here. She has a vascular surgery appointment next week to see if anything can be done to help here. I think she has had previous ablations several years ago at [facility]. She has no major edema. She tells me that she did not get product last time [facility] Ag] and went for several days without it. She continues to work in work boots 12 hours a day. She cannot get compression/4-layer under her work boots. 09/20/15 no major change. Periwound edema  control was not very good. Her point with pain and vascular is next Wednesday the 25th 09/28/15; the patient is seen vascular surgery and is apparently scheduled for repeat duplex ultrasounds of her bilateral lower legs next week. 10/05/15; the patient was seen by Dr. Doren Custard of vascular surgery. He feels that she should have arterial insufficiency excluded as cause/contributed to her nonhealing stage she is therefore booked for an arteriogram. She has apparently monophasic signals in the dorsalis pedis pulses. She also of course has known severe chronic venous insufficiency with previous procedures as noted previously. I had another long discussion with the patient today about her continuing to work 12 hour shifts. I've written her out for 2 months area had concerns about this as her work location is currently undergoing significant turmoil and this may lead to her termination. She is aware of this however I agree with her that she simply cannot continue to stand for 12 hours multiple days a week with the substantial wound areas she has. 10/19/15; the  Dr. Doren Custard appointment was largely for an arteriogram which was normal. She does not have an arterial issue. He didn't make a comment about her chronic venous insufficiency for which she has had previous ablations. Presumably it was not felt that anything additional could be done. The patient is now out of work as I prescribed 2 weeks ago. Her wounds look somewhat less aggravated presumably because of this. I felt I would give debridement another try today 10/25/15; no major change in this patient's wounds. We are struggling to get her product that she can afford into her own home through her insurance. 11/01/15; no major change in the patient's wounds. I have been using silver alginate as the most affordable product. I spoke to Dr. Marla Roe last week with her requested take her to the OR for surgical debridement and placement of ACEL. Dr. Marla Roe told me  that she would be willing to do this however [facility] will not cover this, fortunately the patient has [place] healthcare of some variant 11/08/15; no major change in the patient's wounds. She has been completely nonviable surface that this but is in too much pain with any attempted debridement are clinic. I have arranged for her to see Dr. Marla Roe ham of plastic surgery and this appointment is on Monday. I am hopeful that they will take her to the OR for debridement, possible ACEL ultimately possible skin graft 11/22/15 no major change in the patient's wounds over her bilateral medial calcaneus medial malleolus into the lower legs. Surface on these does not look too bad however on the left there is surrounding erythema and tenderness. This may be cellulitis or could him sleepy tinea. 11/29/15; no major changes in the patient's wounds over her bilateral medial malleolus. There is no infection here and I don't think any additional antibiotics are necessary. There is now plan to move forward. She sees Dr. Marla Roe in a week's time for preparation for operative debridement and ACEL placement I believe on 7/12. She then has a follow-up appointment with Dr. Marla Roe on 7/21 12/28/15; the patient returns today having been taken to the [place] by Dr. Marla Roe 12/12/15 she underwent debridement, intraoperative cultures [which were negative]. She had placement of a wound VAC. Parent really ACEL was not available to be placed. The wound VAC foam apparently adhered to the wound since then she's been using silver alginate, Xeroform under Ace wraps. She still says there is a lot of drainage and a lot of pain 01/31/16; this is a patient I see monthly. I had referred her to Dr. Marla Roe him of plastic surgery for large wounds on her bilateral medial ankles. She has been to the OR twice once in early July and once in early August. She tells me over the last 3 weeks she has been using the wound VAC with  ACEL underneath it. On the right we've simply been using silver alginate. Under Kerlix Coban wraps. 02/28/16; this is a patient I'm currently seeing monthly. She is gone on to have a skin graft over her large venous insufficiency ulcer on the left medial ankle. This was done by Dr. Marla Roe him. The patient is a bit perturbed about why she didn't have one on her right medial ankle wound. She has been using silver alginate to this. 03/06/16; I received a phone call from her plastic surgery Dr. Marla Roe. She expressed some concern about the viability of the skin graft she did on the left medial ankle wound. Asked me to place Endoform  on this. She told me she is not planning to do a subsequent skin graft on the right as the left one did not take very well. I had placed Hydrofera Blue on the right 03/13/16; continue to have a reasonably healthy wound on the right medial ankle. Down to 3 mm in terms of size. There is epithelialization here. The area on the left medial ankle is her skin graft site. I suppose the last week this looks somewhat better. She has an open area inferiorly however in the center there appears to be some viable tissue. There is a lot of surface callus and eschar that will eventually need to come off however none of this looked to be infected. Patient states that the is able to keep the dressing on for several days which is an improvement. 03/20/16 no major change in the circumference of either wound however on the left side the patient was at Dr. Eusebio Friendly office and they did a debridement of left wound. 50% of the wound seems to be epithelialized. I been using Endoform on the left Hydrofera Blue in the right 03/27/16; she arrives today with her wound is not looking as healthy as they did last week. The area on the right clearly has an adherent surface to this a very similar surface on the left. Unfortunately for this patient this is all too familiar problem. Clearly the Endoform  is not working and will need to change that today that has some potential to help this surface. She does not tolerate debridement in this clinic very well. She is changing the dressing wants 04/03/16; patient arrives with the wounds looking somewhat better especially on the right. Dr. Migdalia Dk change the dressing to silver alginate when she saw her on Monday and also sold her some compression socks. The usefulness of the latter is really not clear and woman with severely draining wounds. 04/10/16; the patient is doing a bit of an experiment wearing the compression stockings that Dr. Migdalia Dk provided her to her left leg and the out of legs based dressings that we provided to the right. 05/01/16; the patient is continuing to wear compression stockings Dr. Migdalia Dk provided her on the left that are apparently silver impregnated. She has been using Iodoflex to the right leg wound. Still a moderate amount of drainage, when she leaves here the wraps only last for 4 days. She has to change the stocking on the left leg every night 05/15/16; she is now using compression stockings bilaterally provided by Dr. Marla Roe. She is wearing a nonadherent layer over the wounds so really I don't think there is anything specific being done to this now. She has some reduction on the left wound. The right is stable. I think all healing here is being done without a specific dressing 06/09/16; patient arrives here today with not much change in the wound certainly in diameter to large circular wounds over the medial aspect of her ankle bilaterally. Under the light of these services are certainly not viable for healing. There is no evidence of surrounding infection. She is wearing compression stockings with some sort of silver impregnation as prescribed by Dr. Marla Roe. She has a follow-up with her tomorrow. 06/30/16; no major change in the size or condition of her wounds. These are still probably covered with a nonviable surface.  She is using only her purchase stockings. She did see Dr. Marla Roe who seemed to want to apply Dakin's solution to this I'm not extreme short what value this would be.  I would suggest Iodoflex which she still has at home. 07/28/16; I follow Mrs. Maish episodically along with Dr. Marla Roe. 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 She has been applying a topical collagen ointment to both wounds with Adaptic. I don't think Dr. Marla Roe is planning to take her back to the OR. 08/19/16; I follow Mrs. Jeneen Rinks on a monthly basis along with Dr. Marla Roe of plastic surgery. 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 I been following her for a number of years. At one point I was able to get the right medial malleolus wound to heal and had the left medial malleolus down to about half its current size however and I had to send her to plastic surgery for an operative debridement. Since then things have been stable to slightly improve the area on the right is slightly better one in the left about the same although there is much less adherent surface than I'm used to with this patient. She is using some form of liquid collagen gel that Dr. Marla Roe provided a Kerlix cover with the patient's own pressure stockings. She tells me that she has extreme pain in both ankles and along the lateral aspect of both feet. She has been unable to work for some period of time. She is telling me she is retiring at the beginning of April. She sees Dr. Doran Durand of orthopedics next week 09/22/16; patient has not seen Dr. Marla Roe since the last time she is here. I'm not really sure what she is using to the wounds other than bits and pieces of think she had left over including most recently Hydrofera Blue. She is using juxtalite stockings. She is having difficulty with her husband's recent  illness "stroke". She is having to transport him to various doctors appointments. Dr. Marla Roe left her the option of a repeat debridement with ACEL however she has not been able to get the time to follow-up on this. She continues to have a fair amount of drainage out of these wounds with certainly precludes leaving dressings on all week 10/13/16; patient has not seen Dr. Marla Roe since she was last in our clinic. I'm not really sure what she is doing with the wounds, we did try to get her [facility] and I think she is actually using this most of the time. Because of drainage she states she has to change this every second day although this is an improvement from what she used to do. She went to see Dr. Doran Durand who did not think she had a muscular issue with regards to her feet, he referred her to a neurologist and I think the appointment is sometime in June. I changed her back to Iodoflex which she has used in the past but not recently. 11/03/16; the patient has been using Iodoflex although she ran out of this. Still claims that there is a lot of drainage although the wound does not look like this. No surrounding erythema. She has not been back to see Dr. Marla Roe 11/24/16; the patient has been using Iodoflex again but she ran out of it 2 or 3 days ago. There is no major change in the condition of either one of these wounds in fact they are larger and covered in a thick adherent surface slough/nonviable tissue especially on the left. She does not tolerate mechanical debridement in our clinic. Going back to see Dr. Marla Roe of  plastic surgery for an operative debridement would seem reasonable. 12/15/16; the patient has not been back to see Dr. Marla Roe. She is been dealing with a series of illnesses and her husband which of monopolized her time. She is been using Sorbact which we largely supplied. She states the drainage is bad enough that it maximum she can go 2-3 days without changing the  dressing 01/12/2017 -- the patient has not been back for about 4 weeks and has not seen Dr. Marla Roe not does she have any appointment pending. 01/23/17; patient has not seen Dr. Marla Roe even though I suggested this previously. She is using Santyl that was suggested last week by Dr. Con Memos this Cost her $16 through her insurance which is indeed surprising 02/12/17; continuing Santyl and the patient is changing this daily. A lot of drainage. She has not been back to see plastic surgery she is using an Ace wrap. Our intake nurse suggested wrap around stockings which would make a good reasonable alternative 02/26/17; patient is been using Santyl and changing this daily due to drainage. She has not been to see plastic surgery she uses in April Ace wrap to control the edema. She did obtain extremitease stockings but stated that the edema in her leg was to big for these 03/20/17; patient is using Santyl and Anasept. Surfaces looked better today the area on the right is actually measuring a little smaller. She has states she has a lot of pain in her feet and ankles and is asking for a consult to pain control which I'll try to help her with through our case manager. 04/10/17; the patient arrives with better-looking wound surfaces and is slightly smaller wound on the left she is using a combination of Santyl and Anasept. She has an appointment or at least as started in the pain control center associated with [place] [facility] 05/14/17; this is a patient who I followed for a prolonged period of time. 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 At one point I had this down to a much smaller wound on the left however these reopened and we've never been able to get these to heal. She has been using Santyl and Anasept gel although 2 weeks ago she ran out of the Anasept gel. She has a stable appearance of the wound. She is going to the wound care clinic at [facility]. They wanted do a  nerve block/spinal block although she tells me she is reluctant to go forward with that. 05/21/17; this is a patient I have followed for many years. 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 Chronic pain and deformity in her ankles as well. She is been to see plastic surgery as well as orthopedics. Using PolyMem AG most recently/Kerramax/ABDs and 2 layer compression. She has managed to keep this on and she is coming in for a nurse check to change the dressing on Tuesdays, we see her on Fridays 06/05/17; really quite a good looking surface and the area especially on the right medial has contracted in terms of dimensions. Well granulated healthy-looking tissue on both sides. Even with an open curet there is nothing that even feels abnormal here. This is as good as I've seen this in quite some time. We have been using PolyMem AG and bringing her in for a nurse check 06/12/17; really quite good surface on both of these wounds. The right medial has contracted a bit left is not. We've been using PolyMem and AG and she  is coming in for a nurse visit 06/19/17; we have been using PolyMem AG and bringing her in for a nurse check. Dimensions of her wounds are not better but the surfaces looked better bilaterally. She complained of bleeding last night and the left wound and increasing pain bilaterally. She states her wound pain is more neuropathic than just the wounds. There was some suggestion that this was radicular from her pain management doctor in talking to her it is really difficult to sort this out. 06/26/17; using PolyMem and AG and bringing her in for a nurse check as All of this and reasonably stable condition. Certainly not improved. The dimensions on the lateral part of the right leg look better but not really measuring better. The medial aspect on the left is about the same. 07/03/16; we have been using PolyMen AG and bringing her in for a nurse check to change the dressings as the  wounds have drainage which precludes once weekly changing. We are using all secondary absorptive dressings.our intake nurse is brought up the idea of using a wound VAC/snap VAC on the wound to help with the drainage to see if this would result in some contraction. This is not a bad idea. The area on the right medial is actually looking smaller. Both wounds have a reasonable-looking surface. There is no evidence of cellulitis. The edema is well controlled 07/10/17; the patient was denied for a snap VAC by her insurance. The major issue with these wounds continues to be drainage. We are using wicked PolyMem AG and she is coming in for a nurse visit to change this. The wounds are stable to slightly improved. The surface looks vibrant and the area on the right certainly has shrunk in size but very slowly 07/17/17; the patient still has large wounds on her bilateral medial malleoli. Surface of both of these wounds looks better. The dimensions seem to come and go but no consistent improvement. There is no epithelialization. We do not have options for advanced treatment products due to insurance issues. They did not approve of the wound VAC to help control the drainage. More recently we've been using PolyMem and AG wicked to allow drainage through. We have been bringing her in for a nurse visit to change this. We do not have a lot of options for wound care products and the home again due to insurance issues 07/24/17; the patient's wound actually looks somewhat better today. No drainage measurements are smaller still healthy-looking surface. We used silver collagen under PolyMen started last week. We have been bringing her in for a dressing change 07/31/17; patient's wound surface continued to look better and I think there is visible change in the dimensions of the wound on the right. Rims of epithelialization. We have been using silver collagen under PolyMen and bringing her in for a dressing change. There appears  to be less drainage although she is still in need of the dressing change 08/07/17. Patient's wound surface continues to look better on both sides and the area on the right is definitely smaller. We have been using silver collagen and PolyMen. She feels that the drainage has been it has been better. I asked her about her vascular status. She went to see Dr. Aleda Grana at [facility] and had some form of ablation. I don't have much detail on this. I haven't my notes from 2016 that she was not a candidate for any further ablation but I don't have any more information on this. We had  referred her to vein and vascular I don't think she ever went. He does not have a history of PAD although I don't have any information on this either. We don't even have ABIs in our record 08/14/17; we've been using silver collagen and PolyMen cover. And putting the patient and compression. She we are bringing her in as a nurse visit to change this because ofarge amount of drainage. We didn't the ABIs in clinic today since they had been done in many moons 1.2 bilaterally. She has been to see vein and vascular however this was at [facility] and she had ablation although I really don't have any information on this all seemed biking get a report. She is also been operatively debrided by plastic surgery and had a cell placed probably 8-12 months ago. This didn't have a major effect. We've been making some gains with current dressings 08/19/17-She is here in follow-up evaluation for bilateral medial malleoli ulcers. She continues to tolerate debridement very poorly. We will continue with recently changed topical treatment; if no significant improvement may consider switching to Iodosorb/Iodoflex. 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 She has been using silver collagen and PolyMem. I believe she has been used and tried on Iodoflex before. During her trip to the clinic we've  been watching her wound with Anasept spray and I would like to encourage this on thenurse visit days 09/04/17 bilateral medial malleoli ulcers area is her chronic related to chronic venous insufficiency. These have been very refractory over time. We have been using silver collagen and PolyMen. She is coming in once a week for a doctor's and once a week for nurse visits. We are actually making some progress 09/18/17; the patient's wounds are smaller especially on the right medial. She arrives today to upset to consider even washing these off with Anasept which I think is been part of the reason this is been closing. We've been using collagen covered in PolyMen otherwise. It is noted that she has a small area of folliculitis on the right medial calf that. As we are wrapping her legs I'll give her a short course of doxycycline to make sure this doesn't amount to anything. She is a long list of complaints today including imbalance, shortness of breath on exertion, inversion of her left ankle. With regards to the latter complaints she is been to see orthopedics and they offered her a tendon release surgery I believe but wanted her wounds to be closed first. I have recommended she go see her primary physician with regards to everything else. 09/25/17; patient's wounds are about the same size. We have made some progress bilaterally although not in recent weeks. She will not allow me T wash these wounds with Anasept even if she is doing her cell. Wheeze we've been using collagen covered in PolyMen. Last week she had a small area of folliculitis this is now opened into a small wound. She completed 5 days of trimethoprim sulfamethoxazole 10/02/17; unfortunately the area on her left medial ankle is worse with a larger wound area towards the Achilles. The patient complains of a lot of pain. She will not allow debridement although visually I don't think there is anything to debridement in any case. We have been using  silver collagen and PolyMen for several months now. Initially we are making some progress although I'm not really seeing that today. We will move back to [facility]. His admittedly this is a bit of a  repeat however I'm hoping that his situation is different now. The patient tells me she had her leg on the left give out on her yesterday this is process some pain. 10/09/17; the patient is seen twice a week largely because of drainage issues coming out of the chronic medial bimalleolar wounds that are chronic. Last week the dimensions of the one on the left looks a little larger I changed her to [facility]. She comes in today with a history of terrible pain in the bilateral wound areas. She will not allow debridement. She will not even allow a tissue culture. There is no surrounding erythema no no evidence of cellulitis. We have been putting her Kerlix Coban man. She will not allow more aggressive compression as there was a suggestion to put her in 3 layer wraps. 10/16/17; large wounds on her bilateral medial malleoli. These are chronic. Not much change from last week. The surface looks have healthy but absolutely no epithelialization. A lot of pain little less so of drainage. She will not allow debridement or even washing these off in the vigorous fashion with Anasept. 10/23/17; large wounds on her bilateral malleoli which are chronic. Some improvement in terms of size perhaps on the right since last time I saw these. She states that after we increased the 3 layer compression there was some bleeding, when she came in for a nurse visit she did not want 3 layer compression put back on about our nurse managed to convince her. She has known chronic venous visit issues and I'm hoping to get her to tolerate the 3 layer compression. using Hydrofera Blue 10/30/17; absolutely no change in the condition of either wound although we've had some improvement in dimensions on the right.. Attempted to put her in  3 layer compression she didn't tolerated she is back in 2 layer compression. We've been using Hydrofera Blue We looked over her past records. She had venous reflux studies in November 2016. There was no evidence of deep venous reflux on the right. Superficial vein did not show the greater saphenous vein at think this is been previously ablated the small saphenous vein was within normal limits. The left deep venous system showed no DVT the vessels were positive for deep venous reflux in the posterior tibial veins at the ankle. The greater saphenous vein was surgically absent small saphenous vein was within normal limits. She went to vein and vascular at [facility]. I believe she had an ablation on the left greater saphenous vein. I'll update her reflux studies perhaps ever reviewed by vein and vascular. We've made absolutely no progress in these wounds. Will also try to read and TheraSkins through her insurance 11/06/17; W the patient apparently has a 2 week follow-up with vein and vascular I like him to review the whole issue with regards to her previous vascular workup by Dr. Aleda Grana. We've really made no progress on these wounds in many months. She arrives today with less viable looking surface on the left medial ankle wound. This was apparently looking about the same on Tuesday when she was here for nurse visit. 11/13/17; deep tissue culture I did last time of the left lower leg showed multiple organisms without any predominating. In particular no Staphylococcus or group A strep were isolated. We sent her for venous reflux studies. She's had a previous left greater saphenous vein stripping and I think sclerotherapy of the right greater saphenous vein. She didn't really look at the lesser saphenous vein this both wounds  are on the medial aspect. She has reflux in the common femoral vein and popliteal vein and an accessory vein on the right and the common femoral vein and popliteal vein on the  left. I'm going to have her go to see vein and vascular just the look over things and see if anything else beside aggressive compression is indicated here. We have not been able to make any progress on these wounds in spite of the fact that the surface of the wounds is never look too bad. 11/20/17; no major change in the condition of the wounds. Patient reports a large amount of drainage. She has a lot of complaints of pain although enlisting her today I wonder if some of this at least his neuropathic rather than secondary to her wounds. She has an appointment with vein and vascular on 12/30/17. The refractory nature of these wounds in my mind at least need vein and vascular to look over the wounds the recent reflux studies we did and her history to see if anything further can be done here. I also note her gait is deteriorated quite a bit. 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 She has a bilateral Trendelenburg gait. I wonder if this is neuropathic or perhaps multilevel radicular. 11/27/17; her wounds actually looks slightly better. Healthy-looking granulation tissue a scant amount of epithelialization. [place] [employer] will not pay for [employer]. They will play for tri layer Oasis and Dermagraft. This is not a diabetic ulcer. We'll try for the tri layer Oasis. She still complains of some drainage. She has a vein and vascular appointment on 12/30/17 12/04/17; the wounds visually look quite good. Healthy-looking granulation with some degree of epithelialization. We are still waiting for response to our request for trial to try layer Oasis. Her appointment with vascular to review venous and arterial issues isn't sold the end of July 7/31. Not allow debridement or even vigorous cleansing of the wound surface. 12/18/17; slightly smaller especially on the right. 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 We've been using Hydrofera Blue. We still are looking into triple  layer Oasis through her insurance 01/08/18 on evaluation today patient's wound actually appears to be showing signs of good improvement at this point in time. She has been tolerating the dressing changes without complication. Fortunately there does not appear to be any evidence of infection at this point in time. We have been utilizing silver nitrate which does seem to be of benefit for her which is also good news. Overall I'm very happy with how things seem to be both regards appearance as well as measurement. Patient did see Dr. Bridgett Larsson for evaluation on 12/30/17. In his assessment he felt that stripping would not likely add much more than chronic compression to the patient's healing process. His recommendation was to follow-up in three months with Dr. Doren Custard if she hasn't healed in order to consider referral back to you and see vascular where she previously was in a trial and was able to get her wound to heal. I'll be see what she feels she when you staying compression and he reiterated this as well. 01/13/18 on evaluation today patient appears to actually be doing very well in regard to her bilateral medial malleolus ulcers. She seems to have tolerated the chemical cauterization with silver nitrate last week she did have some pain through that evening but fortunately states that I'll be see since it seems to be doing better she is overall pleased with the  progress. 01/21/18; really quite a remarkable improvement since I've last seen these wounds. We started using silver nitrate specially on the islands of hyper granulation which for some reason her around the wound circumference. This is really done quite nicely. Primary dressing Hydrofera Blue under 4 layer compression. She seems to be able to hold out without a nurse rewrap. Follow-up in 1 week 01/28/18; we've continued the hydrofera blue but continued with chemical cauterization to the wound area that we started about a month ago for irregular  hyper granulation. She is made almost stunning improvement in the overall wound dimensions. I was not really expecting this degree of improvement in these chronic wounds 02/05/18; we continue with Hydrofera Bluebut of also continued the aggressive chemical cauterization with silver nitrate. We made nice progress with the right greater than left wound. 02/12/18. We continued with Hydrofera Blue after aggressive chemical cauterization with silver nitrate. We appear to be making nice progress with both wound areas 02/19/2018; we continue with [facility] after washing the wounds vigorously with Anasept spray and chemical cauterization with silver nitrate. We are making excellent progress. The area on the right's just about closed 02/26/2018. The area on the left medial ankle had too much necrotic debris today. I used a #5 curette we are able to get most of the soft. I continued with the silver nitrate to the much smaller wound on the right medial ankle she had a new area on her right lower pretibial area which she says was due to a role in her compression 03/05/2018; both wound areas look healthy. Not much change in dimensions from last week. I continue to use silver nitrate and Hydrofera Blue. The patient saw Dr. Doren Custard of vein and vascular. He felt she had venous stasis ulcers. He felt based on her previous arteriogram she should have adequate circulation for healing. Also she has deep venous reflux but really no significant correctable superficial venous reflux at this time. He felt we should continue with conservative management including leg elevation and compression 04/02/2018; since we last saw this woman about a month ago she had a fall apparently suffered a pelvic fracture. I did not look up the x-ray. Nevertheless because of pain she literally was bedbound for 2 weeks and had home health coming out to change the dressing. Somewhat predictably this is resulted in considerable improvement in both  wound areas. The right is just about closed on the medial malleolus and the left is about half the size. 04/16/2018; both her wounds continue to go down in size. Using Hydrofera Blue. 05/07/18; both her wounds appeared to be improving especially on the right where it is almost closed. We are using Hydrofera Blue 05/14/2018; slightly worse this week with larger wounds. Surface on the left medial not quite as good. We have been using Hydrofera Blue 05/21/18; again the wounds are slightly larger. Left medial malleolus slightly larger with eschar around the circumference. We have been using Hydrofera Blue undergoing a wraps for a prolonged period of time. This got a lot better when she was more recumbent due to a fall and a back injury. I change the primary dressing the silver alginate today. She did not tolerate a 4 layer compression previously although I may need to bring this up with her next time 05/28/2018; area on the left medial malleolus again is slightly larger with more drainage. Area on the right is roughly unchanged. She has a small area of folliculitis on the right medial just on the  lower calf. This does not look ominous. 06/03/2018 left medial malleolus slightly smaller in a better looking surface. We used silver nitrate on this last time with silver alginate. The area on the right appears slightly smaller 1/10; left medial malleolus slightly smaller. Small open area on the right. We used silver nitrate and silver alginate as of 2 weeks ago. We continue with the wound and compression. These got a lot better when she was off her feet 1/17; right medial malleolus wound is smaller. The left may be slightly smaller. Both surfaces look somewhat better. 1/24; both wounds are slightly smaller. Using silver alginate under Unna boots 1/31; both wounds appear smaller in fact the area on the right medial is just about closed. Surface eschar. We have been using silver alginate under Unna boots. The  patient is less active now spends let much less time on her feet and I think this is contributed to the general improvement in the wound condition 2/7; both wounds appear smaller. I was hopeful the right medial would be closed however there there is still the same small open area. Slight amount of surface eschar on the left the dimensions are smaller there is eschar but the wound edges appear to be free. We have been using silver alginate under Unna boot's 2/14; both wounds once again measure smaller. Circumferential eschar on the left medial. We have been using silver alginate under Unna boots with gradual improvement 2/21; the area on the right medial malleolus has healed. The area on the left is smaller. We have been using silver alginate and Unna boots. We can discharge wrapping the right leg she has 20/30 stockings at home she will need to protect the scar tissue in this area 2/28; the area on the right medial malleolus remains closed the patient has a compression stocking. The area on the left is smaller. We have been using silver alginate and Unna boots. 3/6 the area on the right medial ankle remains closed. Good edema control noted she is using her own compression stocking. The area on the left medial ankle is smaller. We have been managing this with silver alginate and Unna boots which we will continue today. 3/13; the area on the right medial ankle remains closed and I'm declaring it healed today. When necessary the left is about the same still a healthy-looking surface but no major change and wound area. No evidence of infection and using silver alginate under unna and generally making considerable improvement 3/27 the area on the right medial ankle remains closed the area on the left is about the same as last week. Certainly not any worse we have been using silver alginate under an Unna boot 4/3; the area on the right medial ankle remains closed per the patient. We did not look at this  wound. The wound on the left medial ankle is about the same surface looks healthy we have been using silver alginate under an Unna boot 4/10; area on the right medial ankle remains closed per the patient. We did not look at this wound. The wound on the left medial ankle is slightly larger. The patient complains that the [facility] caused burning pain all week. She also told us that she was a lot more active this week. Changed her back to silver alginate 4/17; right medial ankle still closed per the patient. Left medial ankle is slightly larger. Using silver alginate. She did not tolerate Hydrofera Blue on this area 4/24; right medial ankle remains closed we  have not look at this. The left medial ankle continues to get larger today by about a centimeter. We have been using silver alginate under Unna boots. She complains about 4 layer compression as an alternative. She has been up on her feet working on her garden 5/8; right medial ankle remains closed we did not look at this. The left medial ankle has increased in size about 100%. We have been using silver alginate under Unna boots. She noted increased pain this week and was not surprised that the wound is deteriorated 5/15; no major change in SA however much less erythema ( one week of doxy ocellulitis). 5/22-64 year old female returns at 1 week to the clinic for left medial ankle wound for which we have been using silver alginate under 3 layer compression She was placed on DOXY at last visit - the wound is wider at this visit. She is in 3 layer compression 5/29; change to [facility] last week. I had given her empiric doxycycline 2 weeks ago for a week. She is in 3 layer compression. She complains of a lot of pain and drainage on presentation today. 6/5; using Hydrofera Blue. I gave her doxycycline recently empirically for erythema and pain around the wound. Believe her cultures showed enterococcus which not would not have been well covered  by doxycycline nevertheless the wound looks better and I don't feel specifically that the enterococcus needs to be covered. She has a new what looks like a wrap injury on her lateral left ankle. 6/12; she is using Hydrofera Blue. She has a new area on the left anterior lower tibial area. This was a wrap injury last week. 6/19; the patient is using Hydrofera Blue. She arrived with marked inflammation and erythema around the wound and tenderness. 12/01/18 on evaluation today patient appears to be doing a little bit better based on what I'm hearing from the standpoint of lassos evaluation to this as far as the overall appearance of the wound is concerned. Then sometime substandard she typically sees Dr. Dellia Nims. Nonetheless overall very pleased with the progress that she's made up to this point. No fevers, chills, nausea, or vomiting noted at this time. 7/10; some improvement in the surface area. Aggressively debrided last week apparently. I went ahead with the debridement today although the patient does not tolerate this very well. We have been using Iodoflex. Still a fair amount of drainage 7/17; slightly smaller. Using Iodoflex. 7/24; no change from last week in terms of surface area. We have been using Iodoflex. Surface looks and continues to look somewhat better 7/31; surface area slightly smaller better looking surface. We have been using Iodoflex. This is under Unna boot compression 8/7-Patient presents at 1 week with Unna boot and Iodoflex, wound appears better 8/14-Patient presents at 1 week with Iodoflex, we use the Unna boot, wound appears to be stable better.Patient is getting Botox treatment for the inversion of the foot for tendon release, Next week 8/21; we are using Iodoflex. Unna boot. The wound is stable in terms of surface area. Under illumination there is some areas of the wound that appear to be either epithelialized or perhaps this is adherent slough at this point I was not really  clear. It did not wipe off and I was reluctant to debride this today. 8/28; we are using Iodoflex in an Unna boot. Seems to be making good improvement. 9/4; using Iodoflex and wound is slightly smaller. 9/18; we are using Iodoflex with topical silver nitrate when she is here.  The wound continues to be smaller 10/2; patient missed her appointment last week due to GI issues. She left and Iodoflex based dressing on for 2 weeks. Wound is about the same size about the size of a dime on the left medial lower 10/9 we have been using Iodoflex on the medial left ankle wound. She has a new superficial probable wrap injury on the dorsal left ankle 10/16; we have been using Hydrofera Blue since last week. This is on the left medial ankle 10/23; we have been using Hydrofera Blue since 2 weeks ago. This is on the left medial ankle. Dimensions are better 11/6; using Hydrofera Blue. I think the wound is smaller but still not closed. Left medial ankle 11/13; we have been using Hydrofera Blue. Wound is certainly no smaller this week. Also the surface not as good. This is the remanent of a very large area on her left medial ankle. 11/20; using Sorbact since last week. Wound was about the same in terms of size although I was disappointed about the surface debris 12/11; 3-week follow-up. Patient was on vacation. Wound is measuring slightly larger we have been using Sorbact. 12/18; wound is about the same size however surface looks better last week after debridement. We have been using Sorbact under compression 1/15 wound is probably twice the size of last time increased in length nonviable surface. We have been using Sorbact. She was running a mild fever and missed her appointment last week 1/22; the wound is come down in size but under illumination still a very adherent debris we have been Hydrofera Blue that I changed her to last week 1/29; dimensions down slightly. We have been using Hydrofera Blue 2/19 dimensions  are the same however there is rims of epithelialization under illumination. Therefore more the surface area may be epithelialized 2/26; the patient's wound actually measures smaller. The wound looks healthy. We have been using Hydrofera Blue. I had some thoughts about running Apligraf then I still may do that however this looks so much better this week we will delay that for now 3/5; the wound is small but about the same as last week. We have been using Hydrofera Blue. No debridement is required today. 3/19; the wound is about the size of a dime. Healthy looking wound even under illumination. We have been using Hydrofera Blue. No mechanical debridement is necessary 3/26; not much change from last week although still looks very healthy. We have been using Hydrofera Blue under Unna boots Patient was offered an ankle fusion by podiatry but not until the wound heals with a proceed with this. 4/9; the patient comes in today with her original wound on the medial ankle looking satisfactory however she has some uncontrolled swelling in the middle part of her leg with 2 new open areas superiorly just lateral to the tibia. I think this was probably a wrap issue. She said she felt uncomfortable during the week but did not call in. We have been using Hydrofera Blue 4/16; the wound on the medial ankle is about the same. She has innumerable small areas superior to this across her mid tibia. I think this is probably folliculitis. She is also been working in the yard doing a lot of sweating 4/30; the patient issue on the upper areas across her mid tibia of all healed. I think this was excessive yard work if I remember. Her wound on the medial ankle is smaller. Some debris on this we have been using Hydrofera Blue under [employer]  boots 5/7; mid tibia. She has been using Hydrofera Blue under an Unna wrap. She is apparently going for her ankle surgery on June 3 10/28/19-Patient returns to clinic with the ankle wound, we are  using Hydrofera Blue under Unna wrap, surgery is scheduled for her left foot for June 3 so she will be back for nurse visit next week READMISSION 01/17/2020 [name] is a 64 year old woman we have had in this clinic for a long period of time with severe venous hypertension and refractory wounds on her medial lower legs and ankles bilaterally. 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 When she was off her legs for a prolonged period example when she fell and suffered a compression fracture things would heal up quite nicely. She is now retired and we managed to heal up the right medial leg wound. The left one was very tiny last time I saw this although still refractory. 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 It got to the point where this was interfering with her walking and she elected to proceed with a ankle arthrodesis to straighten her her ankle and leave her with a functional outcome for mobilization. The patient was referred to Dr. Doren Custard and really this took some time to arrange. Dr. Doren Custard saw her on 12/07/2019. Once again he verified that she had no arterial issues. She had previously had an angiogram several years ago. Follow-up ABIs on the left showed an ABI of 1.12 with triphasic waveforms and a TBI of 0.92. She is felt to have chronic deep venous insufficiency but I do not think it was felt that anything could be done from about this from an ablation point of view. At the time Dr. Doren Custard saw this patient the wounds actually look closed via the pictures in his clinic. The patient finally underwent her surgery on 12/15/2019. This went reasonably well and there was a good anatomic outcome. She developed a small distal wound dehiscence on the lateral part of the surgical wound.  However more problematically she is developed recurrence of the wound on the medial left ankle. There are actually 2 wounds here one in the distal lower leg and 1 pretty much at the level of the medial malleolus. It is a more distal area that is more problematic. She has been using Hydrofera Blue which started on Friday before this she was simply Ace wrapping. There was a culture done that showed Pseudomonas and she is on ciprofloxacin. A recent CNS on 8/11 was negative. The patient reports some pain but I generally think this is improving. She is using a cam boot completely nonweightbearing using a walker for pivot transfers and a wheelchair 8/24; not much improvement unfortunately she has a surgical wound on the lateral part in the venous insufficiency wound medially. The bottom part of the medial insufficiency wound is still necrotic there is exposed tendon here. We have been using Hydrofera Blue under compression. Her edema control is however better 8/31; patient in for follow-up of his surgical wound on the lateral part of her left leg and chronic venous insufficiency ulcers medially. We put her back in compression last week. She comes in today with a complaint of 3 or 4 days worth of increasing pain. She felt her cam walker was rubbing on  the area on the back of her heel. However there is intense erythema seems more likely she has cellulitis. She had 2 cultures done when she was seeing podiatry in the postop. One of them in late July showed Pseudomonas and she received a course of ciprofloxacin the other was negative on 8/11 she is allergic to penicillin with anaphylactoid complaints of hives oral swelling via information in epic Objective Constitutional Sitting or standing Blood Pressure is within target range for patient.. Pulse regular and within target range for patient.Marland Kitchen Respirations regular, non-labored and within target range.. Temperature is normal and within the target range for the  patient.Marland Kitchen Appears in no distress. Vitals Time Taken: 10:59 AM, Height: 68 in, Temperature: 97.5 F, Pulse: 64 bpm, Respiratory Rate: 18 breaths/min, Blood Pressure: 123/45 mmHg. Cardiovascular Pedal pulses are palpable. General Notes: Wound exam; left medial lower leg the wounds look the same however superiorly I think there is new open areas. The biggest concern this week is intense erythema in the posterior heel extending laterally. She is even tender over the plantar heel although I do not see any erythema here. There is no purulent drainage. I did not think there was anything I could culture with any expectation the that being helpful. Integumentary (Hair, Skin) There is intense erythema in the posterior heel. Wound #14 status is Open. Original cause of wound was Surgical Injury. The wound is located on the Left,Lateral Ankle. The wound measures 0.4cm length x 0.8cm width x 0.2cm depth; 0.251cm^2 area and 0.05cm^3 volume. There is Fat Layer (Subcutaneous Tissue) exposed. There is no tunneling or undermining noted. There is a medium amount of serosanguineous drainage noted. The wound margin is distinct with the outline attached to the wound base. There is large (67-100%) pink granulation within the wound bed. There is a small (1-33%) amount of necrotic tissue within the wound bed including Adherent Slough. Wound #15 status is Open. Original cause of wound was Gradually Appeared. The wound is located on the Left,Medial Malleolus. The wound measures 5cm length x 3.8cm width x 0.7cm depth; 14.923cm^2 area and 10.446cm^3 volume. There is tendon and Fat Layer (Subcutaneous Tissue) exposed. There is no tunneling or undermining noted. There is a large amount of purulent drainage noted. The wound margin is flat and intact. There is medium (34-66%) red granulation within the wound bed. There is a medium (34-66%) amount of necrotic tissue within the wound bed including Adherent Slough. Wound #16 status is  Open. Original cause of wound was Gradually Appeared. The wound is located on the Left,Medial Lower Leg. The wound measures 2.5cm length x 1.5cm width x 0.2cm depth; 2.945cm^2 area and 0.589cm^3 volume. There is Fat Layer (Subcutaneous Tissue) exposed. There is no tunneling or undermining noted. There is a medium amount of serosanguineous drainage noted. The wound margin is flat and intact. There is large (67-100%) red granulation within the wound bed. There is a small (1-33%) amount of necrotic tissue within the wound bed including Adherent Slough. Assessment Active Problems ICD-10 Chronic venous hypertension (idiopathic) with ulcer and inflammation of left lower extremity Non-pressure chronic ulcer of other part of left lower leg with other specified severity Cellulitis of left lower limb Non-pressure chronic ulcer of left ankle with other specified severity Disruption of external operation (surgical) wound, not elsewhere classified, subsequent encounter Plan Follow-up Appointments: Return Appointment in 1 week. Dressing Change Frequency: Change dressing every day. Skin Barriers/Peri-Wound Care: Moisturizing lotion Wound Cleansing: May shower with protection. - use cast protector Primary Wound Dressing:  Wound #14 Left,Lateral Ankle: Hydrofera Blue - Classic Wound #15 Left,Medial Malleolus: Hydrofera Blue - Classic Wound #16 Left,Medial Lower Leg: Hydrofera Blue - Classic Secondary Dressing: Wound #14 Left,Lateral Ankle: Kerlix/Rolled Gauze Dry Gauze Wound #15 Left,Medial Malleolus: Kerlix/Rolled Gauze Dry Gauze ABD pad Wound #16 Left,Medial Lower Leg: Kerlix/Rolled Gauze Dry Gauze ABD pad Edema Control: Avoid standing for long periods of time Elevate legs to the level of the heart or above for 30 minutes daily and/or when sitting, a frequency of: - throughout the day Off-Loading: Removable cast walker boot to: - left foot Home Health: Admit to [facility] [place] Radiology ordered were: X-ray, ankle LEFT - NON HEALING WOUND WITH PURLENT DRAINAGE, X-ray, foot, LEFT - NON HEALING WOUND WITH PURLENT DRAINAGE The following medication(s) was prescribed: Levaquin oral 500 mg tablet 1 tablet oral qd for 7 days for wound infection starting 01/31/2020 sulfamethoxazole-trimethoprim oral 800 mg-160 mg tablet 1 tablet oral bid for 7 days for wound infection starting 01/31/2020 1. I think this patient has significant infection in the posterior heel even extending into the plantar heel. 2. I have x-rayed her foot and ankle 3. I had some discussion about sending her to the ER she stated her husband is going away tonight she needs to discuss things with him. I put her on both Levaquin and Septra DS. I warned her that if the erythema spreads or the pain increases or she become systemically unwell she will need to hit the emergency room. 4. We will use Hydrofera Blue to the wound areas however I am not going to wrap this so she will be able to see any deterioration visually. Not wrapping this is concerning the patient has severe venous hypertension and and this may result in some deterioration all by itself I spent 35 minutes in face-to-face evaluation, review of this patient's history and preparation of this record Electronic Signature(s) Signed: 01/31/2020 5:08:08 PM By: Linton Ham MD Entered By: Linton Ham on 01/31/2020 13:12:04 -------------------------------------------------------------------------------- SuperBill Details Patient Name: Date of Service: [name], [name] 01/31/2020 Medical Record Number: [id number] Patient Account Number: [id number] Date of Birth/Sex: Treating RN: Aug 30, 1955 (64 y.o. [name] Primary Care Provider: [name] Other Clinician: Referring Provider: Treating Provider/Extender: [name] in Treatment: 2 Diagnosis Coding ICD-10 Codes Code Description [phone]  Chronic venous hypertension (idiopathic) with ulcer and inflammation of left lower extremity L97.828 Non-pressure chronic ulcer of other part of left lower leg with other specified severity L03.116 Cellulitis of left lower limb L97.328 Non-pressure chronic ulcer of left ankle with other specified severity T81.31XD Disruption of external operation (surgical) wound, not elsewhere classified, subsequent encounter Facility Procedures CPT4 Code: [id number] Description: 99214 - WOUND CARE VISIT-LEV 4 EST PT Modifier: Quantity: 1 Physician Procedures : CPT4 Code Description Modifier [id number] - WC PHYS LEVEL 4 - EST PT ICD-10 Diagnosis Description I87.332 Chronic venous hypertension (idiopathic) with ulcer and inflammation of left lower extremity L03.116 Cellulitis of left lower limb L97.828  Non-pressure chronic ulcer of other part of left lower leg with other specified severity L97.328 Non-pressure chronic ulcer of left ankle with other specified severity Quantity: 1 Electronic Signature(s) Signed: 01/31/2020 5:08:08 PM By: Linton Ham MD Entered By: Linton Ham on 01/31/2020 13:12:31

## 2020-02-01 ENCOUNTER — Other Ambulatory Visit: Payer: Self-pay | Admitting: Podiatry

## 2020-02-01 DIAGNOSIS — Z9889 Other specified postprocedural states: Secondary | ICD-10-CM

## 2020-02-01 MED ORDER — OXYCODONE-ACETAMINOPHEN 5-325 MG PO TABS: 1.0000 | ORAL_TABLET | Freq: Four times a day (QID) | ORAL | 0 refills | Status: DC | PRN

## 2020-02-01 MED ORDER — OXYCODONE-ACETAMINOPHEN 10-325 MG PO TABS
1.0000 | ORAL_TABLET | ORAL | 0 refills | Status: AC | PRN
Start: 2020-02-01 — End: 2020-02-08

## 2020-02-01 NOTE — Progress Notes (Signed)
Dr. [name] approved refill of Percocet 5/325 mg for postoperative pain.

## 2020-02-01 NOTE — Progress Notes (Signed)
PRN postop pain 

## 2020-02-01 NOTE — Progress Notes (Signed)
[name], [name] ([id number]) Visit Report for 01/31/2020 Arrival Information Details Patient Name: Date of Service: [name] 01/31/2020 10:30 A M Medical Record Number: [id number] Patient Account Number: [id number] Date of Birth/Sex: Treating RN: 1955-10-14 (64 y.o. [name] Primary Care [name]: [name] Other Clinician: Referring [name]: Treating [name]/Extender: [name] in Treatment: 2 Visit Information History Since Last Visit Added or deleted any medications: No Patient Arrived: [name] Any new allergies or adverse reactions: No Arrival Time: 10:57 Had a fall or experienced change in No Accompanied By: alone activities of daily living that may affect Transfer Assistance: None risk of falls: Patient Identification Verified: Yes Signs or symptoms of abuse/neglect since last visito No Secondary Verification Process Completed: Yes Hospitalized since last visit: No Patient Has Alerts: Yes Implantable device outside of the clinic excluding No Patient Alerts: L ABI =1.12, TBI = .92 cellular tissue based products placed in the center R ABI= 1.02, TBI= .58 since last visit: Has Dressing in Place as Prescribed: Yes Has Compression in Place as Prescribed: Yes Pain Present Now: Yes Electronic Signature(s) Signed: 02/01/2020 11:03:23 AM By: [name] Hurst RN, BSN Entered By: [name] on 01/31/2020 10:59:36 -------------------------------------------------------------------------------- Clinic Level of Care Assessment Details Patient Name: Date of Service: [name]. 01/31/2020 10:30 A M Medical Record Number: [id number] Patient Account Number: [id number] Date of Birth/Sex: Treating RN: 15-Apr-1956 (64 y.o. [name] Primary Care Tynleigh Birt: [name] Other Clinician: Referring [name]: Treating [name]/Extender: [name] in Treatment: 2 Clinic Level of Care Assessment  Items TOOL 4 Quantity Score X- 1 0 Use when only an EandM is performed on FOLLOW-UP visit ASSESSMENTS - Nursing Assessment / Reassessment X- 1 10 Reassessment of Co-morbidities (includes updates in patient status) X- 1 5 Reassessment of Adherence to Treatment Plan ASSESSMENTS - Wound and Skin A ssessment / Reassessment []  - 0 Simple Wound Assessment / Reassessment - one wound X- 3 5 Complex Wound Assessment / Reassessment - multiple wounds []  - 0 Dermatologic / Skin Assessment (not related to wound area) ASSESSMENTS - Focused Assessment []  - 0 Circumferential Edema Measurements - multi extremities []  - 0 Nutritional Assessment / Counseling / Intervention []  - 0 Lower Extremity Assessment (monofilament, tuning fork, pulses) []  - 0 Peripheral Arterial Disease Assessment (using hand held doppler) ASSESSMENTS - Ostomy and/or Continence Assessment and Care []  - 0 Incontinence Assessment and Management []  - 0 Ostomy Care Assessment and Management (repouching, etc.) PROCESS - Coordination of Care []  - 0 Simple Patient / Family Education for ongoing care X- 1 20 Complex (extensive) Patient / Family Education for ongoing care X- 1 10 Staff obtains Programmer, systems, Records, T Results / Process Orders est []  - 0 Staff telephones HHA, Nursing Homes / Clarify orders / etc []  - 0 Routine Transfer to another Facility (non-emergent condition) []  - 0 Routine Hospital Admission (non-emergent condition) []  - 0 New Admissions / Biomedical engineer / Ordering NPWT Apligraf, etc. , []  - 0 Emergency Hospital Admission (emergent condition) X- 1 10 Simple Discharge Coordination []  - 0 Complex (extensive) Discharge Coordination PROCESS - Special Needs []  - 0 Pediatric / Minor Patient Management []  - 0 Isolation Patient Management []  - 0 Hearing / Language / Visual special needs []  - 0 Assessment of Community assistance (transportation, D/C planning, etc.) []  - 0 Additional  assistance / Altered mentation []  - 0 Support Surface(s) Assessment (bed, cushion, seat, etc.) INTERVENTIONS - Wound Cleansing / Measurement []  -  0 Simple Wound Cleansing - one wound X- 3 5 Complex Wound Cleansing - multiple wounds X- 1 5 Wound Imaging (photographs - any number of wounds) []  - 0 Wound Tracing (instead of photographs) []  - 0 Simple Wound Measurement - one wound X- 3 5 Complex Wound Measurement - multiple wounds INTERVENTIONS - Wound Dressings []  - 0 Small Wound Dressing one or multiple wounds []  - 0 Medium Wound Dressing one or multiple wounds X- 1 20 Large Wound Dressing one or multiple wounds X- 1 5 Application of Medications - topical []  - 0 Application of Medications - injection INTERVENTIONS - Miscellaneous []  - 0 External ear exam []  - 0 Specimen Collection (cultures, biopsies, blood, body fluids, etc.) []  - 0 Specimen(s) / Culture(s) sent or taken to Lab for analysis []  - 0 Patient Transfer (multiple staff / Civil Service fast streamer / Similar devices) []  - 0 Simple Staple / Suture removal (25 or less) []  - 0 Complex Staple / Suture removal (26 or more) []  - 0 Hypo / Hyperglycemic Management (close monitor of Blood Glucose) []  - 0 Ankle / Brachial Index (ABI) - do not check if billed separately X- 1 5 Vital Signs Has the patient been seen at the hospital within the last three years: Yes Total Score: 135 Level Of Care: New/Established - Level 4 Electronic Signature(s) Signed: 02/01/2020 10:37:46 AM By: [name] Coria RN Entered By: [name] on 01/31/2020 11:40:31 -------------------------------------------------------------------------------- Encounter Discharge Information Details Patient Name: Date of Service: [name]. 01/31/2020 10:30 A M Medical Record Number: [id number] Patient Account Number: [id number] Date of Birth/Sex: Treating RN: Feb 03, 1956 (64 y.o. [name] Primary Care Charlsey Moragne: [name] Other  Clinician: Referring [name]: Treating [name]/Extender: [name] in Treatment: 2 Encounter Discharge Information Items Discharge Condition: Stable Ambulatory Status: Walker Discharge Destination: Home Transportation: Private Auto Accompanied By: husband Schedule Follow-up Appointment: Yes Clinical Summary of Care: Patient Declined Electronic Signature(s) Signed: 01/31/2020 5:00:49 PM By: Kela Millin Entered By: Kela Millin on 01/31/2020 12:03:03 -------------------------------------------------------------------------------- Lower Extremity Assessment Details Patient Name: Date of Service: [facility] [name], ELEA NO R [name]. 01/31/2020 10:30 A M Medical Record Number: [id number] Patient Account Number: [id number] Date of Birth/Sex: Treating RN: [birth year] (64 y.o. [name] Primary Care Glyndon Tursi: [name] Other Clinician: Referring Amarilys Lyles: Treating Cesario Weidinger/Extender: [name] in Treatment: 2 Edema Assessment Assessed: [Left: No] [Right: No] Edema: [Left: Ye] [Right: s] Calf Left: Right: Point of Measurement: cm From Medial Instep 29 cm cm Ankle Left: Right: Point of Measurement: cm From Medial Instep 23.5 cm cm Vascular Assessment Pulses: Dorsalis Pedis Palpable: [Left:Yes] Electronic Signature(s) Signed: 02/01/2020 11:03:23 AM By: [name] Hurst RN, BSN Entered By: [name] on 01/31/2020 11:09:57 -------------------------------------------------------------------------------- Multi Wound Chart Details Patient Name: Date of Service: [name], ELEA NO R [name]. 01/31/2020 10:30 A M Medical Record Number: [id number] Patient Account Number: [id number] Date of Birth/Sex: Treating RN: 1955/11/20 (64 y.o. [name] Primary Care Raja Caputi: [name] Other Clinician: Referring Cody Albus: Treating Ulice Follett/Extender: [name] in Treatment: 2 Vital Signs Height(in):  68 Pulse(bpm): 58 Weight(lbs): Blood Pressure(mmHg): 123/45 Body Mass Index(BMI): Temperature(F): 97.5 Respiratory Rate(breaths/min): 18 Photos: [14:No Photos Left, Lateral Ankle] [15:No Photos Left, Medial Malleolus] [16:No Photos Left, Medial Lower Leg] Wound Location: [14:Surgical Injury] [15:Gradually Appeared] [16:Gradually Appeared] Wounding Event: [14:Open Surgical Wound] [15:Venous Leg Ulcer] [16:Venous Leg Ulcer] Primary Etiology: [14:Peripheral Venous Disease,] [15:Peripheral Venous Disease,] [16:Peripheral Venous Disease,] Comorbid History: [14:Osteoarthritis, Confinement Anxiety Osteoarthritis, Confinement  Anxiety Osteoarthritis, Confinement Anxiety 12/15/2019] [15:12/30/2019] [16:12/30/2019] Date Acquired: [14:2] [15:2] [16:2] Weeks of Treatment: [14:Open] [15:Open] [16:Open] Wound Status: [14:0.4x0.8x0.2] [15:5x3.8x0.7] [16:2.5x1.5x0.2] Measurements L x W x D (cm) [14:0.251] [15:14.923] [16:2.945] A (cm) : rea [14:0.05] [15:10.446] [16:0.589] Volume (cm) : [14:64.50%] [15:-28.00%] [16:-73.60%] % Reduction in Area: [14:85.80%] [15:-11.90%] [16:-246.50%] % Reduction in Volume: [14:Full Thickness Without Exposed] [15:Full Thickness With Exposed Support Full Thickness Without Exposed] Classification: [14:Support Structures Medium] [15:Structures Large] [16:Support Structures Medium] Exudate Amount: [14:Serosanguineous] [15:Purulent] [16:Serosanguineous] Exudate Type: [14:red, brown] [15:yellow, brown, green] [16:red, brown] Exudate Color: [14:Distinct, outline attached] [15:Flat and Intact] [16:Flat and Intact] Wound Margin: [14:Large (67-100%)] [15:Medium (34-66%)] [16:Large (67-100%)] Granulation Amount: [14:Pink] [15:Red] [16:Red] Granulation Quality: [14:Small (1-33%)] [15:Medium (34-66%)] [16:Small (1-33%)] Necrotic Amount: [14:Fat Layer (Subcutaneous Tissue): Yes Fat Layer (Subcutaneous Tissue): Yes Fat Layer (Subcutaneous Tissue): Yes] Exposed Structures: [14:Fascia:  No Tendon: No Muscle: No Joint: No Bone: No Medium (34-66%)] [15:Tendon: Yes Fascia: No Muscle: No Joint: No Bone: No Small (1-33%)] [16:Fascia: No Tendon: No Muscle: No Joint: No Bone: No Small (1-33%)] Treatment Notes Wound #14 (Left, Lateral Ankle) 1. Cleanse With Wound Cleanser Soap and water 3. Primary Dressing Applied Hydrofera Blue 4. Secondary Dressing ABD Pad Dry Gauze Roll Gauze 5. Secured With Tape Wound #15 (Left, Medial Malleolus) 1. Cleanse With Wound Cleanser Soap and water 3. Primary Dressing Applied Hydrofera Blue 4. Secondary Dressing ABD Pad Dry Gauze Roll Gauze 5. Secured With Tape Wound #16 (Left, Medial Lower Leg) 1. Cleanse With Wound Cleanser Soap and water 3. Primary Dressing Applied Hydrofera Blue 4. Secondary Dressing ABD Pad Dry Gauze Roll Gauze 5. Secured With Recruitment consultant) Signed: 01/31/2020 5:08:08 PM By: Linton Ham MD Signed: 02/01/2020 10:37:46 AM By: [name] Coria RN Entered By: Linton Ham on 01/31/2020 13:05:10 -------------------------------------------------------------------------------- Multi-Disciplinary Care Plan Details Patient Name: Date of Service: [name], ELEA NO R [name]. 01/31/2020 10:30 A M Medical Record Number: [id number] Patient Account Number: [id number] Date of Birth/Sex: Treating RN: Jan 28, 1956 (64 y.o. [name] Primary Care [name] Sanjuan: [name] Other Clinician: Referring Shashank Kwasnik: Treating Tahjay Binion/Extender: [name] in Treatment: 2 Active Inactive Abuse / Safety / Falls / Self Care Management Nursing Diagnoses: Potential for falls Potential for injury related to falls Goals: Patient will not experience any injury related to falls Date Initiated: 01/17/2020 Target Resolution Date: 02/17/2020 Goal Status: Active Patient/caregiver will verbalize/demonstrate measures taken to prevent injury and/or falls Date Initiated: 01/17/2020 Target Resolution  Date: 02/17/2020 Goal Status: Active Interventions: Assess Activities of Daily Living upon admission and as needed Assess fall risk on admission and as needed Assess: immobility, friction, shearing, incontinence upon admission and as needed Assess impairment of mobility on admission and as needed per policy Assess personal safety and home safety (as indicated) on admission and as needed Assess self care needs on admission and as needed Provide education on fall prevention Provide education on personal and home safety Notes: Wound/Skin Impairment Nursing Diagnoses: Impaired tissue integrity Knowledge deficit related to ulceration/compromised skin integrity Goals: Patient/caregiver will verbalize understanding of skin care regimen Date Initiated: 01/17/2020 Target Resolution Date: 02/17/2020 Goal Status: Active Ulcer/skin breakdown will have a volume reduction of 30% by week 4 Date Initiated: 01/17/2020 Target Resolution Date: 02/17/2020 Goal Status: Active Interventions: Assess patient/caregiver ability to obtain necessary supplies Assess patient/caregiver ability to perform ulcer/skin care regimen upon admission and as needed Assess ulceration(s) every visit Provide education on ulcer and skin care Notes: Electronic Signature(s) Signed: 02/01/2020 10:37:46 AM By: [name] Coria RN Entered  ByCarlene [name] on 01/31/2020 11:23:46 -------------------------------------------------------------------------------- Pain Assessment Details Patient Name: Date of Service: [facility] [name], Carlton [name] 01/31/2020 10:30 A M Medical Record Number: [id number] Patient Account Number: [id number] Date of Birth/Sex: Treating RN: July 23, 1955 (64 y.o. [name] Primary Care Jara Feider: [name] Other Clinician: Referring Denny Lave: Treating Shayley Medlin/Extender: [name] in Treatment: 2 Active Problems Location of Pain Severity and Description of Pain Patient Has Paino  Yes Site Locations Rate the pain. Rate the pain. Current Pain Level: 8 Pain Management and Medication Current Pain Management: Electronic Signature(s) Signed: 02/01/2020 10:37:46 AM By: [name] Coria RN Signed: 02/01/2020 11:00:36 AM By: [name] Entered By: [name] on 01/31/2020 11:00:47 -------------------------------------------------------------------------------- Patient/Caregiver Education Details Patient Name: Date of Service: [name], Carlton [name] 8/31/2021andnbsp10:30 A M Medical Record Number: [id number] Patient Account Number: [id number] Date of Birth/Gender: Treating RN: [birth year] (64 y.o. [name] Primary Care Physician: [name] Other Clinician: Referring Physician: Treating Physician/Extender: [name] in Treatment: 2 Education Assessment Education Provided To: Patient Education Topics Provided Wound/Skin Impairment: Methods: Explain/Verbal Responses: State content correctly Electronic Signature(s) Signed: 02/01/2020 10:37:46 AM By: [name] Coria RN Entered By: [name] on 01/31/2020 11:24:07 -------------------------------------------------------------------------------- Wound Assessment Details Patient Name: Date of Service: [facility] [name], ELEA NO R [name]. 01/31/2020 10:30 A M Medical Record Number: [id number] Patient Account Number: [id number] Date of Birth/Sex: Treating RN: 1956-01-02 (64 y.o. [name] Primary Care Tarek Cravens: [name] Other Clinician: Referring Knoah Nedeau: Treating Dorea Duff/Extender: [name] in Treatment: 2 Wound Status Wound Number: 14 Primary Open Surgical Wound Etiology: Wound Location: Left, Lateral Ankle Wound Status: Open Wounding Event: Surgical Injury Comorbid Peripheral Venous Disease, Osteoarthritis, Confinement Date Acquired: 12/15/2019 History: Anxiety Weeks Of Treatment: 2 Clustered Wound: No Wound Measurements Length: (cm)  0.4 Width: (cm) 0.8 Depth: (cm) 0.2 Area: (cm) 0.251 Volume: (cm) 0.05 % Reduction in Area: 64.5% % Reduction in Volume: 85.8% Epithelialization: Medium (34-66%) Tunneling: No Undermining: No Wound Description Classification: Full Thickness Without Exposed Support Structures Wound Margin: Distinct, outline attached Exudate Amount: Medium Exudate Type: Serosanguineous Exudate Color: red, brown Foul [name] After Cleansing: No Slough/Fibrino Yes Wound Bed Granulation Amount: Large (67-100%) Exposed Structure Granulation Quality: Pink Fascia Exposed: No Necrotic Amount: Small (1-33%) Fat Layer (Subcutaneous Tissue) Exposed: Yes Necrotic Quality: Adherent Slough Tendon Exposed: No Muscle Exposed: No Joint Exposed: No Bone Exposed: No Treatment Notes Wound #14 (Left, Lateral Ankle) 1. Cleanse With Wound Cleanser Soap and water 3. Primary Dressing Applied Hydrofera Blue 4. Secondary Dressing ABD Pad Dry Gauze Roll Gauze 5. Secured With Recruitment consultant) Signed: 02/01/2020 11:03:23 AM By: [name] Hurst RN, BSN Entered By: [name] on 01/31/2020 10:57:15 -------------------------------------------------------------------------------- Wound Assessment Details Patient Name: Date of Service: [name], ELEA NO R [name]. 01/31/2020 10:30 A M Medical Record Number: [id number] Patient Account Number: [id number] Date of Birth/Sex: Treating RN: 05/03/1956 (64 y.o. [name] Primary Care Solash Tullo: [name] Other Clinician: Referring Yosselyn Tax: Treating Taimi Towe/Extender: [name] in Treatment: 2 Wound Status Wound Number: 15 Primary Venous Leg Ulcer Etiology: Wound Location: Left, Medial Malleolus Wound Status: Open Wounding Event: Gradually Appeared Comorbid Peripheral Venous Disease, Osteoarthritis, Confinement Date Acquired: 12/30/2019 History: Anxiety Weeks Of Treatment: 2 Clustered Wound: No Wound  Measurements Length: (cm) 5 Width: (cm) 3.8 Depth: (cm) 0.7 Area: (cm) 14.923 Volume: (cm) 10.446 % Reduction in Area: -28% % Reduction in Volume: -11.9% Epithelialization: Small (1-33%) Tunneling: No Undermining: No Wound Description  Classification: Full Thickness With Exposed Support Structures Wound Margin: Flat and Intact Exudate Amount: Large Exudate Type: Purulent Exudate Color: yellow, brown, green Foul [name] After Cleansing: No Slough/Fibrino Yes Wound Bed Granulation Amount: Medium (34-66%) Exposed Structure Granulation Quality: Red Fascia Exposed: No Necrotic Amount: Medium (34-66%) Fat Layer (Subcutaneous Tissue) Exposed: Yes Necrotic Quality: Adherent Slough Tendon Exposed: Yes Muscle Exposed: No Joint Exposed: No Bone Exposed: No Treatment Notes Wound #15 (Left, Medial Malleolus) 1. Cleanse With Wound Cleanser Soap and water 3. Primary Dressing Applied Hydrofera Blue 4. Secondary Dressing ABD Pad Dry Gauze Roll Gauze 5. Secured With Recruitment consultant) Signed: 02/01/2020 11:03:23 AM By: [name] Hurst RN, BSN Entered By: [name] on 01/31/2020 10:57:48 -------------------------------------------------------------------------------- Wound Assessment Details Patient Name: Date of Service: [name], ELEA NO R [name]. 01/31/2020 10:30 A M Medical Record Number: [id number] Patient Account Number: [id number] Date of Birth/Sex: Treating RN: 1956-04-15 (64 y.o. [name] Primary Care Hadlie Gipson: [name] Other Clinician: Referring Elyana Grabski: Treating Herman Fiero/Extender: [name] in Treatment: 2 Wound Status Wound Number: 16 Primary Venous Leg Ulcer Etiology: Wound Location: Left, Medial Lower Leg Wound Status: Open Wounding Event: Gradually Appeared Comorbid Peripheral Venous Disease, Osteoarthritis, Confinement Date Acquired: 12/30/2019 History: Anxiety Weeks Of Treatment: 2 Clustered Wound: No Wound  Measurements Length: (cm) 2.5 Width: (cm) 1.5 Depth: (cm) 0.2 Area: (cm) 2.945 Volume: (cm) 0.589 % Reduction in Area: -73.6% % Reduction in Volume: -246.5% Epithelialization: Small (1-33%) Tunneling: No Undermining: No Wound Description Classification: Full Thickness Without Exposed Support Structures Wound Margin: Flat and Intact Exudate Amount: Medium Exudate Type: Serosanguineous Exudate Color: red, brown Foul [name] After Cleansing: No Slough/Fibrino Yes Wound Bed Granulation Amount: Large (67-100%) Exposed Structure Granulation Quality: Red Fascia Exposed: No Necrotic Amount: Small (1-33%) Fat Layer (Subcutaneous Tissue) Exposed: Yes Necrotic Quality: Adherent Slough Tendon Exposed: No Muscle Exposed: No Joint Exposed: No Bone Exposed: No Treatment Notes Wound #16 (Left, Medial Lower Leg) 1. Cleanse With Wound Cleanser Soap and water 3. Primary Dressing Applied Hydrofera Blue 4. Secondary Dressing ABD Pad Dry Gauze Roll Gauze 5. Secured With Recruitment consultant) Signed: 02/01/2020 11:03:23 AM By: [name] Hurst RN, BSN Entered By: [name] on 01/31/2020 10:56:40 -------------------------------------------------------------------------------- Vitals Details Patient Name: Date of Service: [name], ELEA NO R [name]. 01/31/2020 10:30 A M Medical Record Number: [id number] Patient Account Number: [id number] Date of Birth/Sex: Treating RN: 05/05/1956 (64 y.o. [name] Primary Care Margarito Dehaas: [name] Other Clinician: Referring Charlena Haub: Treating Nahdia Doucet/Extender: [name] in Treatment: 2 Vital Signs Time Taken: 10:59 Temperature (F): 97.5 Height (in): 68 Pulse (bpm): 64 Respiratory Rate (breaths/min): 18 Blood Pressure (mmHg): 123/45 Reference Range: 80 - 120 mg / dl Electronic Signature(s) Signed: 02/01/2020 11:00:36 AM By: [name] Entered By: [name] on 01/31/2020 11:00:19

## 2020-02-02 ENCOUNTER — Telehealth (INDEPENDENT_AMBULATORY_CARE_PROVIDER_SITE_OTHER): Payer: Medicare Other | Admitting: Podiatry

## 2020-02-02 ENCOUNTER — Other Ambulatory Visit: Payer: Self-pay | Admitting: Podiatry

## 2020-02-02 NOTE — Telephone Encounter (Signed)
Spoke to [place], rep at [employer] regarding preauthorization for Percocet 5/325 mg. Per Merry Proud, patient's prescription approved for 6 months. Authorization # [id number].

## 2020-02-07 ENCOUNTER — Ambulatory Visit (INDEPENDENT_AMBULATORY_CARE_PROVIDER_SITE_OTHER): Payer: Medicare Other | Admitting: Podiatry

## 2020-02-07 ENCOUNTER — Other Ambulatory Visit: Payer: Self-pay

## 2020-02-07 DIAGNOSIS — L97322 Non-pressure chronic ulcer of left ankle with fat layer exposed: Secondary | ICD-10-CM

## 2020-02-07 DIAGNOSIS — R600 Localized edema: Secondary | ICD-10-CM

## 2020-02-07 DIAGNOSIS — Z9889 Other specified postprocedural states: Secondary | ICD-10-CM | POA: Diagnosis not present

## 2020-02-07 NOTE — Progress Notes (Signed)
   Subjective:  Patient presents today status post tibiotalar calcaneal arthrodesis left lower extremity. DOS: 12/15/2019. Patient last seen in the office on 01/13/2020. Patient has been seeing [facility] weekly for wound care to the medial aspect of the ankle opposite the incision site. She presents for further treatment and evaluation and follow-up.   Past Medical History:  Diagnosis Date  . Allergy   . Ankle wound 02/2016   bilateral  . Dental bridge present    lower  . Dental crowns present    upper right  . Open wounds involving multiple regions of lower extremity   . Peripheral vascular disease (HCC)    venous stasis ulcers bilateral lower extremities  . PONV (postoperative nausea and vomiting)    Objective/Physical Exam Skin incision to the lateral ankle appears to be healthy and well coapted and healed. Lateral incision is completely healed.  The distal portion of the incision did have some delayed healing but today it appears completely healed.  Medial aspect of the ankle demonstrates skin breakdown with full-thickness ulcers noted.  Wound base is mostly granular with serosanguineous drainage noted.  The distal portion of the ulcer does appear to be deeper with an exposed PT tendon.  No malodor noted.  There is diffuse edema throughout the foot. Otherwise the foot is in a rectus position with good alignment of the foot in relationship to the leg.  The foot is in an almost 90 degree angle.  Assessment: 1. s/p tibiotalar calcaneal arthrodesis left. DOS: 12/15/2019 2.  Postsurgical edema left  3.  Ulcer right medial ankle secondary to venous insufficiency 4.  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 Patient was evaluated.  2.  Continue management of wound ulcers to the medial aspect of the ankle at the wound care center. 3.  Today, to address the edema of the foot, multilayer Unna boot compression was applied to the calf. Puracol Plus collagen  wound dressing was applied directly to the ulcers 4.  Return to clinic in 3 weeks for follow-up  Edrick Kins, DPM [facility]  Dr. Edrick Kins, DPM    [address], [place] [address]                Office [phone]  Fax [phone]

## 2020-02-09 ENCOUNTER — Encounter (HOSPITAL_BASED_OUTPATIENT_CLINIC_OR_DEPARTMENT_OTHER): Payer: Medicare Other | Attending: Internal Medicine | Admitting: Internal Medicine

## 2020-02-09 ENCOUNTER — Other Ambulatory Visit: Payer: Self-pay

## 2020-02-09 DIAGNOSIS — T8131XD Disruption of external operation (surgical) wound, not elsewhere classified, subsequent encounter: Secondary | ICD-10-CM | POA: Diagnosis not present

## 2020-02-09 DIAGNOSIS — F419 Anxiety disorder, unspecified: Secondary | ICD-10-CM | POA: Insufficient documentation

## 2020-02-09 DIAGNOSIS — G8929 Other chronic pain: Secondary | ICD-10-CM | POA: Diagnosis not present

## 2020-02-09 DIAGNOSIS — L97828 Non-pressure chronic ulcer of other part of left lower leg with other specified severity: Secondary | ICD-10-CM | POA: Diagnosis present

## 2020-02-09 DIAGNOSIS — G629 Polyneuropathy, unspecified: Secondary | ICD-10-CM | POA: Insufficient documentation

## 2020-02-09 DIAGNOSIS — X58XXXA Exposure to other specified factors, initial encounter: Secondary | ICD-10-CM | POA: Diagnosis not present

## 2020-02-09 DIAGNOSIS — K219 Gastro-esophageal reflux disease without esophagitis: Secondary | ICD-10-CM | POA: Insufficient documentation

## 2020-02-09 DIAGNOSIS — I10 Essential (primary) hypertension: Secondary | ICD-10-CM | POA: Diagnosis not present

## 2020-02-09 DIAGNOSIS — Z880 Allergy status to penicillin: Secondary | ICD-10-CM | POA: Diagnosis not present

## 2020-02-09 DIAGNOSIS — Y939 Activity, unspecified: Secondary | ICD-10-CM | POA: Diagnosis not present

## 2020-02-09 DIAGNOSIS — L03116 Cellulitis of left lower limb: Secondary | ICD-10-CM | POA: Insufficient documentation

## 2020-02-09 DIAGNOSIS — L97328 Non-pressure chronic ulcer of left ankle with other specified severity: Secondary | ICD-10-CM | POA: Diagnosis not present

## 2020-02-10 NOTE — Progress Notes (Signed)
MYKAILA, BLUNCK ([id number]) Visit Report for 02/09/2020 Debridement Details Patient Name: Date of Service: [facility] [name], [name] 02/09/2020 10:00 A M Medical Record Number: [id number] Patient Account Number: [id number] Date of Birth/Sex: Treating RN: [birth year] (64 y.o. [name], [name] Primary Care Provider: [name] Other Clinician: Referring Provider: Treating Provider/Extender: [name] in Treatment: 3 Debridement Performed for Assessment: Wound #15 Left,Medial Malleolus Performed By: Physician [name]., MD Debridement Type: Debridement Severity of Tissue Pre Debridement: Fat layer exposed Level of Consciousness (Pre-procedure): Awake and Alert Pre-procedure Verification/Time Out Yes - 11:28 Taken: Start Time: 11:29 Pain Control: Lidocaine 4% T opical Solution T Area Debrided (L x W): otal 2 (cm) x 2 (cm) = 4 (cm) Tissue and other material debrided: Viable, Non-Viable, Eschar, Slough, Subcutaneous, Skin: Dermis , Fibrin/Exudate, Slough Level: Skin/Subcutaneous Tissue Debridement Description: Excisional Instrument: Curette Bleeding: Minimum Hemostasis Achieved: Pressure End Time: 11:33 Procedural Pain: 0 Post Procedural Pain: 3 Response to Treatment: Procedure was tolerated well Level of Consciousness (Post- Awake and Alert procedure): Post Debridement Measurements of Total Wound Length: (cm) 7.3 Width: (cm) 4.8 Depth: (cm) 0.9 Volume: (cm) 24.768 Character of Wound/Ulcer Post Debridement: Requires Further Debridement Severity of Tissue Post Debridement: Fat layer exposed Post Procedure Diagnosis Same as Pre-procedure Electronic Signature(s) Signed: 02/09/2020 5:40:39 PM By: Deon Pilling Signed: 02/10/2020 1:37:47 PM By: Linton Ham MD Entered By: Linton Ham on 02/09/2020 12:52:09 -------------------------------------------------------------------------------- HPI Details Patient Name: Date of Service: [name], ELEA  NO [name] 02/09/2020 10:00 A M Medical Record Number: [id number] Patient Account Number: [id number] Date of Birth/Sex: Treating RN: 12-28-1955 (64 y.o. [name] Primary Care Provider: [name] Other Clinician: Referring Provider: Treating Provider/Extender: [name] in Treatment: 3 History of Present Illness HPI Description: the remaining wound is over the left medial ankle. Similar wound over the right medial ankle healed largely with use of Apligraf. Most recently we have been using Hydrofera Blue over this wound with considerable improvement. The patient has been extensively worked up in the past for her venous insufficiency and she is not a candidate for antireflux surgery although I have none of the details available currently. 08/24/14; considerable improvement today. About 50% of this wound areas now epithelialized. The base of the wound appears to be healthier granulation.as opposed to last week when she had deteriorated a considerable improvement 08/17/14; unfortunately the wound has regressed somewhat. The areas of epithelialization from the superior aspect are not nearly as healthy as they were last week. The patient thinks her Hydrofera Blue slipped. 09/07/14; unfortunately the area has markedly regressed in the 2 weeks since I've seen this. There is an odor surrounding erythema. The healthy granulation tissue that we had at the base of the wound now is a dusky color. The nurse reports green drainage 09/14/14; the area looks somewhat better than last week. There is less erythema and less drainage. The culture I did did not show any growth. Nevertheless I think it is better to continue the Cipro and doxycycline for a further week. The remaining wound area was debridement. 09/21/14. Wound did not require debridement last week. Still less erythema and less drainage. She can complete her antibiotics. The areas of epithelialization in the superior aspect of  the wound do not look as healthy as they did some weeks ago 10/05/14 continued improvement in the condition of this wound. There is advancing epithelialization. Less aggressive debridement required 10/19/14 continued improvement in the condition and volume of  this wound. Less aggressive debridement to the inferior part of this to remove surface slough and fibrinous eschar 11/02/14 no debridement is required. The surface granulation appears healthy although some of her islands of epithelialization seem to have regressed. No evidence of infection 11/16/14; lites surface debridement done of surface eschar. The wound does not look to be unhealthy. No evidence of infection. Unfortunately the patient has had podiatry issues in the right foot and for some reason has redeveloped small surface ulcerations in the medial right ankle. Her original presentation involved wounds in this area 11/23/14 no debridement. The area on the right ankle has enlarged. The left ankle wound appears stable in terms of the surface although there is periwound inflammation. There has been regression in the amount of new skin 11/30/14 no debridement. Both wound areas appear healthy. There was no evidence of infection. The the new area on the right medial ankle has enlarged although that both the surfaces appear to be stable. 12/07/14; Debridement of the right medial ankle wound. No no debridement was done on the left. 12/14/14 no major change in and now bilateral medial ankle wounds. Both of these are very painful but the no overt evidence of infection. She has had previous venous ablation 12/21/14; patient states that her right medial ankle wound is considerably more painful last week than usual. Her left is also somewhat painful. She could not tolerate debridement. The right medial ankle wound has fibrinous surface eschar 12/28/14 this is a patient with severe bilateral venous insufficiency ulcers. For a considerable period of time we  actually had the one on the right medial ankle healed however this recently opened up again in June. The left medial ankle wound has been a refractory area with some absent flows. We had some success with Hydrofera Blue on this area and it literally closed by 50% however it is recently opened up Foley. Both of these were debridement today of surface eschar. She tolerates this poorly 01/25/15: No change in the status of this. Thick adherent escar. Very poor tolerance of any attempt at debridement. I had healed the right medial malleolus wound for a considerable amount of time and had the left one down to about 50% of the volume although this is totally regressed over the last 48 weeks. Further the right leg has reopened. she is trying to make a appointment with pain and vascular, previous ablations with Dr. Aleda Grana. I do not believe there is an arterial insufficiency issue here 02/01/15 the status of the adherent eschar bilaterally is actually improved. No debridement was done. She did not manage to get vascular studies done 02/08/15 continued debridement of the area was done today. The slough is less adherent and comes off with less pressure. There is no surrounding infection peripheral pulses are intact 02/15/15 selective debridement with a disposable curette. Again the slough is less adherent and comes off with less difficulty. No surrounding infection peripheral pulses are intact. 02/22/15 selective debridement of the right medial ankle wound. Slough comes off with less difficulty. No obvious surrounding infection peripheral pulses are intact I did not debridement the one on the left. Both of these are stable to improved 03/01/15 selective debridement of both wound areas using a curette to. Adherent slough cup soft with less difficulty. No obvious surrounding infection. The patient tells me that 2 days ago she noted a rash above the right leg wrap. She did not have this on her lower legs when she  change this over she arrives with  widespread left greater than right almost folliculitis-looking rash which is extremely pruritic. I don't see anything to culture here. There is no rash on the rest of her body. She feels well systemically. 03/08/15; selective debridement of both wounds using a curette. Base of this does not look unhealthy. She had limegreen drainage coming out of the left leg wound and describes a lot of drainage. The rash on her left leg looks improved to. No cultures were done. 03/22/15; patient was not here last week. Basal wounds does not look healthy and there is no surrounding erythema. No drainage. There is still a rash on the left leg that almost looks vasculitic however it is clearly limited to the top of where the wrap would be. 04/05/15; on the right required a surgical debridement of surface eschar and necrotic subcutaneous tissue. I did not debridement the area on the left. These continue to be large open wounds that are not changing that much. We were successful at one point in healing the area on the right, and at the same time the area on the left was roughly half the size of current measurements. I think a lot of the deterioration has to do with the prolonged time the patient is on her feet at work 04/19/15 I attempted-like surface debridement bilaterally she does not tolerate this. She tells me that she was in allergic care yesterday with extreme pain over her left lateral malleolus/ankle and was told that she has an "sprain" 05/03/15; large bilateral venous insufficiency wounds over the medial malleolus/medial aspect of her ankles. She complains of copious amounts of drainage and his usual large amounts of pain. There is some increasing erythema around the wound on the right extending into the medial aspect of her foot to. historically she came in with these wounds the right one healed and the left one came down to roughly half its current size however the right one is  reopened and the left is expanded. 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 05/10/15 large bilateral venous insufficiency wounds. There is less adherence surface left however the surface culture that I did last week grew pseudomonas therefore bilateral selective debridement score necessary. There is surrounding erythema. The patient describes severe bilateral drainage and a lot of pain in the left ankle. Apparently her podiatrist was were ready to do a cortisone shot 05/17/15; the patient complains of pain and again copious amounts of drainage. 05/24/15; we used Iodo flex last week. Patient notes considerable improvement in wound drainage. Only needed to change this once. 05/31/15; we continued Iodoflex; the base of these large wounds bilaterally is not too bad but there is probably likely a significant bioburden here. I would like to debridement just doesn't tolerate it. 06/06/14 I would like to continue the Iodoflex although she still hasn't managed to obtain supplies. She has bilateral medial malleoli or large wounds which are mostly superficial. Both of them are covered circumferentially with some nonviable fibrinous slough although she tolerates debridement very poorly. She apparently has an appointment for an ablation on the right leg by interventional radiology. 06/14/15; the patient arrives with the wounds and static condition. We attempted a debridement although she does not do well with this secondary to pain. I 07/05/15; wounds are not much smaller however there appears to be a cleaner granulating base. The left has tight fibrinous slough greater than the right. Debridement is tolerated poorly due to pain.  Iodoflex is done more for these wounds in any of the multitude of different dressings I have tried on the left 1 and then subsequently the right. 07/12/15; no change in the condition of this wound. I am able to do an aggressive debridement on  the right but not the left. She simply cannot tolerate it. We have been using Iodoflex which helps somewhat. It is worthwhile remembering that at one point we healed the right medial ankle wound and the left was about 25% of the current circumference. We have suggested returning to vascular surgery for review of possible further ablations for one reason or another she has not been able to do this. 07/26/15 no major change in the condition of either wound on her medial ankle. I did not attempt to debridement of these. She has been aggressively scrubbing these while she is in the shower at home. She has her supply of Iodoflex which seems to have done more for these wounds then anything I have put on recently. 08/09/15 wound area appears larger although not verified by measurements. Using Iodoflex 09/05/2015 -- she was here for avisit today but had significant problems with the wound and I was asked to see her for a physician opinion. I have summarize that this lady has had surgery on her left lower extremity about 10 years ago where the possible veins stripping was done. She has had an opinion from interventional radiology around November 2016 where no further sclerotherapy was ordered. The patient works 12 hours a day and stands on a concrete floor with work boots and is unable to get the proper compression she requires and cannot elevate her limbs appropriately at any given time. She has recently grown Pseudomonas from her wound culture but has not started her ciprofloxacin which was called in for her. 09/13/15 this continues to be a difficult situation for this patient. At one point I had this wound down to a 1.5 x 1.5" wound on her left leg. This is deteriorated and the right leg has reopened. She now has substantial wounds on her medial calcaneus, malleoli and into her lower leg. One on the left has surface eschar but these are far too painful for me to debridement here. She has a vascular surgery  appointment next week to see if anything can be done to help here. I think she has had previous ablations several years ago at [facility]. She has no major edema. She tells me that she did not get product last time [employer]] and went for several days without it. She continues to work in work boots 12 hours a day. She cannot get compression/4-layer under her work boots. 09/20/15 no major change. Periwound edema control was not very good. Her point with pain and vascular is next Wednesday the 25th 09/28/15; the patient is seen vascular surgery and is apparently scheduled for repeat duplex ultrasounds of her bilateral lower legs next week. 10/05/15; the patient was seen by Dr. Doren Custard of vascular surgery. He feels that she should have arterial insufficiency excluded as cause/contributed to her nonhealing stage she is therefore booked for an arteriogram. She has apparently monophasic signals in the dorsalis pedis pulses. She also of course has known severe chronic venous insufficiency with previous procedures as noted previously. I had another long discussion with the patient today about her continuing to work 12 hour shifts. I've written her out for 2 months area had concerns about this as her work location is currently undergoing significant turmoil and  this may lead to her termination. She is aware of this however I agree with her that she simply cannot continue to stand for 12 hours multiple days a week with the substantial wound areas she has. 10/19/15; the Dr. Doren Custard appointment was largely for an arteriogram which was normal. She does not have an arterial issue. He didn't make a comment about her chronic venous insufficiency for which she has had previous ablations. Presumably it was not felt that anything additional could be done. The patient is now out of work as I prescribed 2 weeks ago. Her wounds look somewhat less aggravated presumably because of this. I felt I would give debridement another  try today 10/25/15; no major change in this patient's wounds. We are struggling to get her product that she can afford into her own home through her insurance. 11/01/15; no major change in the patient's wounds. I have been using silver alginate as the most affordable product. I spoke to Dr. Marla Roe last week with her requested take her to the OR for surgical debridement and placement of ACEL. Dr. Marla Roe told me that she would be willing to do this however [facility] will not cover this, fortunately the patient has [place] healthcare of some variant 11/08/15; no major change in the patient's wounds. She has been completely nonviable surface that this but is in too much pain with any attempted debridement are clinic. I have arranged for her to see Dr. Marla Roe ham of plastic surgery and this appointment is on Monday. I am hopeful that they will take her to the OR for debridement, possible ACEL ultimately possible skin graft 11/22/15 no major change in the patient's wounds over her bilateral medial calcaneus medial malleolus into the lower legs. Surface on these does not look too bad however on the left there is surrounding erythema and tenderness. This may be cellulitis or could him sleepy tinea. 11/29/15; no major changes in the patient's wounds over her bilateral medial malleolus. There is no infection here and I don't think any additional antibiotics are necessary. There is now plan to move forward. She sees Dr. Marla Roe in a week's time for preparation for operative debridement and ACEL placement I believe on 7/12. She then has a follow-up appointment with Dr. Marla Roe on 7/21 12/28/15; the patient returns today having been taken to the [facility] by Dr. Marla Roe 12/12/15 she underwent debridement, intraoperative cultures [which were negative]. She had placement of a wound VAC. Parent really ACEL was not available to be placed. The wound VAC foam apparently adhered to the wound since then  she's been using silver alginate, Xeroform under Ace wraps. She still says there is a lot of drainage and a lot of pain 01/31/16; this is a patient I see monthly. I had referred her to Dr. Marla Roe him of plastic surgery for large wounds on her bilateral medial ankles. She has been to the OR twice once in early July and once in early August. She tells me over the last 3 weeks she has been using the wound VAC with ACEL underneath it. On the right we've simply been using silver alginate. Under Kerlix Coban wraps. 02/28/16; this is a patient I'm currently seeing monthly. She is gone on to have a skin graft over her large venous insufficiency ulcer on the left medial ankle. This was done by Dr. Marla Roe him. The patient is a bit perturbed about why she didn't have one on her right medial ankle wound. She has been using silver  alginate to this. 03/06/16; I received a phone call from her plastic surgery Dr. Marla Roe. She expressed some concern about the viability of the skin graft she did on the left medial ankle wound. Asked me to place Endoform on this. She told me she is not planning to do a subsequent skin graft on the right as the left one did not take very well. I had placed Hydrofera Blue on the right 03/13/16; continue to have a reasonably healthy wound on the right medial ankle. Down to 3 mm in terms of size. There is epithelialization here. The area on the left medial ankle is her skin graft site. I suppose the last week this looks somewhat better. She has an open area inferiorly however in the center there appears to be some viable tissue. There is a lot of surface callus and eschar that will eventually need to come off however none of this looked to be infected. Patient states that the is able to keep the dressing on for several days which is an improvement. 03/20/16 no major change in the circumference of either wound however on the left side the patient was at Dr. Eusebio Friendly office and  they did a debridement of left wound. 50% of the wound seems to be epithelialized. I been using Endoform on the left Hydrofera Blue in the right 03/27/16; she arrives today with her wound is not looking as healthy as they did last week. The area on the right clearly has an adherent surface to this a very similar surface on the left. Unfortunately for this patient this is all too familiar problem. Clearly the Endoform is not working and will need to change that today that has some potential to help this surface. She does not tolerate debridement in this clinic very well. She is changing the dressing wants 04/03/16; patient arrives with the wounds looking somewhat better especially on the right. Dr. Migdalia Dk change the dressing to silver alginate when she saw her on Monday and also sold her some compression socks. The usefulness of the latter is really not clear and woman with severely draining wounds. 04/10/16; the patient is doing a bit of an experiment wearing the compression stockings that Dr. Migdalia Dk provided her to her left leg and the out of legs based dressings that we provided to the right. 05/01/16; the patient is continuing to wear compression stockings Dr. Migdalia Dk provided her on the left that are apparently silver impregnated. She has been using Iodoflex to the right leg wound. Still a moderate amount of drainage, when she leaves here the wraps only last for 4 days. She has to change the stocking on the left leg every night 05/15/16; she is now using compression stockings bilaterally provided by Dr. Marla Roe. She is wearing a nonadherent layer over the wounds so really I don't think there is anything specific being done to this now. She has some reduction on the left wound. The right is stable. I think all healing here is being done without a specific dressing 06/09/16; patient arrives here today with not much change in the wound certainly in diameter to large circular wounds over the medial  aspect of her ankle bilaterally. Under the light of these services are certainly not viable for healing. There is no evidence of surrounding infection. She is wearing compression stockings with some sort of silver impregnation as prescribed by Dr. Marla Roe. She has a follow-up with her tomorrow. 06/30/16; no major change in the size or condition of her wounds. These  are still probably covered with a nonviable surface. She is using only her purchase stockings. She did see Dr. Marla Roe who seemed to want to apply Dakin's solution to this I'm not extreme short what value this would be. I would suggest Iodoflex which she still has at home. 07/28/16; I follow Mrs. Motl episodically along with Dr. Marla Roe. 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 She has been applying a topical collagen ointment to both wounds with Adaptic. I don't think Dr. Marla Roe is planning to take her back to the OR. 08/19/16; I follow Mrs. Jeneen Rinks on a monthly basis along with Dr. Marla Roe of plastic surgery. 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 I been following her for a number of years. At one point I was able to get the right medial malleolus wound to heal and had the left medial malleolus down to about half its current size however and I had to send her to plastic surgery for an operative debridement. Since then things have been stable to slightly improve the area on the right is slightly better one in the left about the same although there is much less adherent surface than I'm used to with this patient. She is using some form of liquid collagen gel that Dr. Marla Roe provided a Kerlix cover with the patient's own pressure stockings. She tells me that she has extreme pain in both ankles and along the lateral aspect of both feet. She has been unable to work for some period of time.  She is telling me she is retiring at the beginning of April. She sees Dr. Doran Durand of orthopedics next week 09/22/16; patient has not seen Dr. Marla Roe since the last time she is here. I'm not really sure what she is using to the wounds other than bits and pieces of think she had left over including most recently Hydrofera Blue. She is using juxtalite stockings. She is having difficulty with her husband's recent illness "stroke". She is having to transport him to various doctors appointments. Dr. Marla Roe left her the option of a repeat debridement with ACEL however she has not been able to get the time to follow-up on this. She continues to have a fair amount of drainage out of these wounds with certainly precludes leaving dressings on all week 10/13/16; patient has not seen Dr. Marla Roe since she was last in our clinic. I'm not really sure what she is doing with the wounds, we did try to get her [facility] and I think she is actually using this most of the time. Because of drainage she states she has to change this every second day although this is an improvement from what she used to do. She went to see Dr. Doran Durand who did not think she had a muscular issue with regards to her feet, he referred her to a neurologist and I think the appointment is sometime in June. I changed her back to Iodoflex which she has used in the past but not recently. 11/03/16; the patient has been using Iodoflex although she ran out of this. Still claims that there is a lot of drainage although the wound does not look like this. No surrounding erythema. She has not been back to see Dr. Marla Roe 11/24/16; the patient has been using Iodoflex again but she ran out of it 2 or 3 days ago. There is no major change in the condition of  either one of these wounds in fact they are larger and covered in a thick adherent surface slough/nonviable tissue especially on the left. She does not tolerate mechanical debridement in our  clinic. Going back to see Dr. Marla Roe of plastic surgery for an operative debridement would seem reasonable. 12/15/16; the patient has not been back to see Dr. Marla Roe. She is been dealing with a series of illnesses and her husband which of monopolized her time. She is been using Sorbact which we largely supplied. She states the drainage is bad enough that it maximum she can go 2-3 days without changing the dressing 01/12/2017 -- the patient has not been back for about 4 weeks and has not seen Dr. Marla Roe not does she have any appointment pending. 01/23/17; patient has not seen Dr. Marla Roe even though I suggested this previously. She is using Santyl that was suggested last week by Dr. Con Memos this Cost her $16 through her insurance which is indeed surprising 02/12/17; continuing Santyl and the patient is changing this daily. A lot of drainage. She has not been back to see plastic surgery she is using an Ace wrap. Our intake nurse suggested wrap around stockings which would make a good reasonable alternative 02/26/17; patient is been using Santyl and changing this daily due to drainage. She has not been to see plastic surgery she uses in April Ace wrap to control the edema. She did obtain extremitease stockings but stated that the edema in her leg was to big for these 03/20/17; patient is using Santyl and Anasept. Surfaces looked better today the area on the right is actually measuring a little smaller. She has states she has a lot of pain in her feet and ankles and is asking for a consult to pain control which I'll try to help her with through our case manager. 04/10/17; the patient arrives with better-looking wound surfaces and is slightly smaller wound on the left she is using a combination of Santyl and Anasept. She has an appointment or at least as started in the pain control center associated with [facility] 05/14/17; this is a patient who I followed for a prolonged period of time.  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 At one point I had this down to a much smaller wound on the left however these reopened and we've never been able to get these to heal. She has been using Santyl and Anasept gel although 2 weeks ago she ran out of the Anasept gel. She has a stable appearance of the wound. She is going to the wound care clinic at [facility]. They wanted do a nerve block/spinal block although she tells me she is reluctant to go forward with that. 05/21/17; this is a patient I have followed for many years. 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 Chronic pain and deformity in her ankles as well. She is been to see plastic surgery as well as orthopedics. Using PolyMem AG most recently/Kerramax/ABDs and 2 layer compression. She has managed to keep this on and she is coming in for a nurse check to change the dressing on Tuesdays, we see her on Fridays 06/05/17; really quite a good looking surface and the area especially on the right medial has contracted in terms of dimensions. Well granulated healthy-looking tissue on both sides. Even with an open curet there is nothing that even feels abnormal here. This is as good as I've seen this in quite some time. We have been  using PolyMem AG and bringing her in for a nurse check 06/12/17; really quite good surface on both of these wounds. The right medial has contracted a bit left is not. We've been using PolyMem and AG and she is coming in for a nurse visit 06/19/17; we have been using PolyMem AG and bringing her in for a nurse check. Dimensions of her wounds are not better but the surfaces looked better bilaterally. She complained of bleeding last night and the left wound and increasing pain bilaterally. She states her wound pain is more neuropathic than just the wounds. There was some suggestion that this was radicular from her pain management doctor in talking to her it is really  difficult to sort this out. 06/26/17; using PolyMem and AG and bringing her in for a nurse check as All of this and reasonably stable condition. Certainly not improved. The dimensions on the lateral part of the right leg look better but not really measuring better. The medial aspect on the left is about the same. 07/03/16; we have been using PolyMen AG and bringing her in for a nurse check to change the dressings as the wounds have drainage which precludes once weekly changing. We are using all secondary absorptive dressings.our intake nurse is brought up the idea of using a wound VAC/snap VAC on the wound to help with the drainage to see if this would result in some contraction. This is not a bad idea. The area on the right medial is actually looking smaller. Both wounds have a reasonable-looking surface. There is no evidence of cellulitis. The edema is well controlled 07/10/17; the patient was denied for a snap VAC by her insurance. The major issue with these wounds continues to be drainage. We are using wicked PolyMem AG and she is coming in for a nurse visit to change this. The wounds are stable to slightly improved. The surface looks vibrant and the area on the right certainly has shrunk in size but very slowly 07/17/17; the patient still has large wounds on her bilateral medial malleoli. Surface of both of these wounds looks better. The dimensions seem to come and go but no consistent improvement. There is no epithelialization. We do not have options for advanced treatment products due to insurance issues. They did not approve of the wound VAC to help control the drainage. More recently we've been using PolyMem and AG wicked to allow drainage through. We have been bringing her in for a nurse visit to change this. We do not have a lot of options for wound care products and the home again due to insurance issues 07/24/17; the patient's wound actually looks somewhat better today. No drainage measurements  are smaller still healthy-looking surface. We used silver collagen under PolyMen started last week. We have been bringing her in for a dressing change 07/31/17; patient's wound surface continued to look better and I think there is visible change in the dimensions of the wound on the right. Rims of epithelialization. We have been using silver collagen under PolyMen and bringing her in for a dressing change. There appears to be less drainage although she is still in need of the dressing change 08/07/17. Patient's wound surface continues to look better on both sides and the area on the right is definitely smaller. We have been using silver collagen and PolyMen. She feels that the drainage has been it has been better. I asked her about her vascular status. She went to see Dr. Aleda Grana at [facility] and  had some form of ablation. I don't have much detail on this. I haven't my notes from 2016 that she was not a candidate for any further ablation but I don't have any more information on this. We had referred her to vein and vascular I don't think she ever went. He does not have a history of PAD although I don't have any information on this either. We don't even have ABIs in our record 08/14/17; we've been using silver collagen and PolyMen cover. And putting the patient and compression. She we are bringing her in as a nurse visit to change this because ofarge amount of drainage. We didn't the ABIs in clinic today since they had been done in many moons 1.2 bilaterally. She has been to see vein and vascular however this was at [facility] and she had ablation although I really don't have any information on this all seemed biking get a report. She is also been operatively debrided by plastic surgery and had a cell placed probably 8-12 months ago. This didn't have a major effect. We've been making some gains with current dressings 08/19/17-She is here in follow-up evaluation for bilateral medial malleoli  ulcers. She continues to tolerate debridement very poorly. We will continue with recently changed topical treatment; if no significant improvement may consider switching to Iodosorb/Iodoflex. 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 She has been using silver collagen and PolyMem. I believe she has been used and tried on Iodoflex before. 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 These have been very refractory over time. We have been using silver collagen and PolyMen. She is coming in once a week for a doctor's and once a week for nurse visits. We are actually making some progress 09/18/17; the patient's wounds are smaller especially on the right medial. She arrives today to upset to consider even washing these off with Anasept which I think is been part of the reason this is been closing. We've been using collagen covered in PolyMen otherwise. It is noted that she has a small area of folliculitis on the right medial calf that. As we are wrapping her legs I'll give her a short course of doxycycline to make sure this doesn't amount to anything. She is a long list of complaints today including imbalance, shortness of breath on exertion, inversion of her left ankle. With regards to the latter complaints she is been to see orthopedics and they offered her a tendon release surgery I believe but wanted her wounds to be closed first. I have recommended she go see her primary physician with regards to everything else. 09/25/17; patient's wounds are about the same size. We have made some progress bilaterally although not in recent weeks. She will not allow me T wash these wounds with Anasept even if she is doing her cell. Wheeze we've been using collagen covered in PolyMen.  Last week she had a small area of folliculitis this is now opened into a small wound. She completed 5 days of trimethoprim sulfamethoxazole 10/02/17; unfortunately the area on her left medial ankle is worse with a larger wound area towards the Achilles. The patient complains of a lot of pain. She will not allow debridement although visually I don't think there is anything to debridement in any case. We  have been using silver collagen and PolyMen for several months now. Initially we are making some progress although I'm not really seeing that today. We will move back to [facility]. His admittedly this is a bit of a repeat however I'm hoping that his situation is different now. The patient tells me she had her leg on the left give out on her yesterday this is process some pain. 10/09/17; the patient is seen twice a week largely because of drainage issues coming out of the chronic medial bimalleolar wounds that are chronic. Last week the dimensions of the one on the left looks a little larger I changed her to [employer]. She comes in today with a history of terrible pain in the bilateral wound areas. She will not allow debridement. She will not even allow a tissue culture. There is no surrounding erythema no no evidence of cellulitis. We have been putting her Kerlix Coban man. She will not allow more aggressive compression as there was a suggestion to put her in 3 layer wraps. 10/16/17; large wounds on her bilateral medial malleoli. These are chronic. Not much change from last week. The surface looks have healthy but absolutely no epithelialization. A lot of pain little less so of drainage. She will not allow debridement or even washing these off in the vigorous fashion with Anasept. 10/23/17; large wounds on her bilateral malleoli which are chronic. Some improvement in terms of size perhaps on the right since last time I saw these. She states that after we increased the 3 layer compression there was  some bleeding, when she came in for a nurse visit she did not want 3 layer compression put back on about our nurse managed to convince her. She has known chronic venous visit issues and I'm hoping to get her to tolerate the 3 layer compression. using Hydrofera Blue 10/30/17; absolutely no change in the condition of either wound although we've had some improvement in dimensions on the right.. Attempted to put her in 3 layer compression she didn't tolerated she is back in 2 layer compression. We've been using Hydrofera Blue We looked over her past records. She had venous reflux studies in November 2016. There was no evidence of deep venous reflux on the right. Superficial vein did not show the greater saphenous vein at think this is been previously ablated the small saphenous vein was within normal limits. The left deep venous system showed no DVT the vessels were positive for deep venous reflux in the posterior tibial veins at the ankle. The greater saphenous vein was surgically absent small saphenous vein was within normal limits. She went to vein and vascular at [facility]. I believe she had an ablation on the left greater saphenous vein. I'll update her reflux studies perhaps ever reviewed by vein and vascular. We've made absolutely no progress in these wounds. Will also try to read and TheraSkins through her insurance 11/06/17; W the patient apparently has a 2 week follow-up with vein and vascular I like him to review the whole issue with regards to her previous vascular workup by Dr. Aleda Grana. We've really made no progress on these wounds in many months. She arrives today with less viable looking surface on the left medial ankle wound. This was apparently looking about the same on Tuesday when she was here for nurse visit. 11/13/17; deep tissue culture I did last time of the left lower leg showed multiple organisms without any predominating. In particular no Staphylococcus or group A strep  were isolated. We sent her for venous reflux studies. She's had a previous left greater saphenous vein stripping and I think sclerotherapy of the right greater saphenous vein. She didn't really look at the lesser saphenous vein this both wounds are on the medial aspect. She has reflux in the common femoral vein and popliteal vein and an accessory vein on the right and the common femoral vein and popliteal vein on the left. I'm going to have her go to see vein and vascular just the look over things and see if anything else beside aggressive compression is indicated here. We have not been able to make any progress on these wounds in spite of the fact that the surface of the wounds is never look too bad. 11/20/17; no major change in the condition of the wounds. Patient reports a large amount of drainage. She has a lot of complaints of pain although enlisting her today I wonder if some of this at least his neuropathic rather than secondary to her wounds. She has an appointment with vein and vascular on 12/30/17. The refractory nature of these wounds in my mind at least need vein and vascular to look over the wounds the recent reflux studies we did and her history to see if anything further can be done here. I also note her gait is deteriorated quite a bit. 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 She has a bilateral Trendelenburg gait. I wonder if this is neuropathic or perhaps multilevel radicular. 11/27/17; her wounds actually looks slightly better. Healthy-looking granulation tissue a scant amount of epithelialization. [place] healthcare will not pay for [employer]. They will play for tri layer Oasis and Dermagraft. This is not a diabetic ulcer. We'll try for the tri layer Oasis. She still complains of some drainage. She has a vein and vascular appointment on 12/30/17 12/04/17; the wounds visually look quite good. Healthy-looking granulation with some degree of epithelialization. We are still  waiting for response to our request for trial to try layer Oasis. Her appointment with vascular to review venous and arterial issues isn't sold the end of July 7/31. Not allow debridement or even vigorous cleansing of the wound surface. 12/18/17; slightly smaller especially on the right. 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 We've been using Hydrofera Blue. We still are looking into triple layer Oasis through her insurance 01/08/18 on evaluation today patient's wound actually appears to be showing signs of good improvement at this point in time. She has been tolerating the dressing changes without complication. Fortunately there does not appear to be any evidence of infection at this point in time. We have been utilizing silver nitrate which does seem to be of benefit for her which is also good news. Overall I'm very happy with how things seem to be both regards appearance as well as measurement. Patient did see Dr. Bridgett Larsson for evaluation on 12/30/17. In his assessment he felt that stripping would not likely add much more than chronic compression to the patient's healing process. His recommendation was to follow-up in three months with Dr. Doren Custard if she hasn't healed in order to consider referral back to you and see vascular where she previously was in a trial and was able to get her wound to heal. I'll be see what she feels she when you staying compression and he reiterated this as well. 01/13/18 on evaluation today patient appears to actually be doing very well in regard to her bilateral medial malleolus ulcers. She seems  to have tolerated the chemical cauterization with silver nitrate last week she did have some pain through that evening but fortunately states that I'll be see since it seems to be doing better she is overall pleased with the progress. 01/21/18; really quite a remarkable improvement since I've last seen these wounds. We started using silver nitrate specially  on the islands of hyper granulation which for some reason her around the wound circumference. This is really done quite nicely. Primary dressing Hydrofera Blue under 4 layer compression. She seems to be able to hold out without a nurse rewrap. Follow-up in 1 week 01/28/18; we've continued the hydrofera blue but continued with chemical cauterization to the wound area that we started about a month ago for irregular hyper granulation. She is made almost stunning improvement in the overall wound dimensions. I was not really expecting this degree of improvement in these chronic wounds 02/05/18; we continue with Hydrofera Bluebut of also continued the aggressive chemical cauterization with silver nitrate. We made nice progress with the right greater than left wound. 02/12/18. We continued with Hydrofera Blue after aggressive chemical cauterization with silver nitrate. We appear to be making nice progress with both wound areas 02/19/2018; we continue with [facility] after washing the wounds vigorously with Anasept spray and chemical cauterization with silver nitrate. We are making excellent progress. The area on the right's just about closed 02/26/2018. The area on the left medial ankle had too much necrotic debris today. I used a #5 curette we are able to get most of the soft. I continued with the silver nitrate to the much smaller wound on the right medial ankle she had a new area on her right lower pretibial area which she says was due to a role in her compression 03/05/2018; both wound areas look healthy. Not much change in dimensions from last week. I continue to use silver nitrate and Hydrofera Blue. The patient saw Dr. Doren Custard of vein and vascular. He felt she had venous stasis ulcers. He felt based on her previous arteriogram she should have adequate circulation for healing. Also she has deep venous reflux but really no significant correctable superficial venous reflux at this time. He felt we should  continue with conservative management including leg elevation and compression 04/02/2018; since we last saw this woman about a month ago she had a fall apparently suffered a pelvic fracture. I did not look up the x-ray. Nevertheless because of pain she literally was bedbound for 2 weeks and had home health coming out to change the dressing. Somewhat predictably this is resulted in considerable improvement in both wound areas. The right is just about closed on the medial malleolus and the left is about half the size. 04/16/2018; both her wounds continue to go down in size. Using Hydrofera Blue. 05/07/18; both her wounds appeared to be improving especially on the right where it is almost closed. We are using Hydrofera Blue 05/14/2018; slightly worse this week with larger wounds. Surface on the left medial not quite as good. We have been using Hydrofera Blue 05/21/18; again the wounds are slightly larger. Left medial malleolus slightly larger with eschar around the circumference. We have been using Hydrofera Blue undergoing a wraps for a prolonged period of time. This got a lot better when she was more recumbent due to a fall and a back injury. I change the primary dressing the silver alginate today. She did not tolerate a 4 layer compression previously although I may need to bring this up  with her next time 05/28/2018; area on the left medial malleolus again is slightly larger with more drainage. Area on the right is roughly unchanged. She has a small area of folliculitis on the right medial just on the lower calf. This does not look ominous. 06/03/2018 left medial malleolus slightly smaller in a better looking surface. We used silver nitrate on this last time with silver alginate. The area on the right appears slightly smaller 1/10; left medial malleolus slightly smaller. Small open area on the right. We used silver nitrate and silver alginate as of 2 weeks ago. We continue with the wound and compression.  These got a lot better when she was off her feet 1/17; right medial malleolus wound is smaller. The left may be slightly smaller. Both surfaces look somewhat better. 1/24; both wounds are slightly smaller. Using silver alginate under Unna boots 1/31; both wounds appear smaller in fact the area on the right medial is just about closed. Surface eschar. We have been using silver alginate under Unna boots. The patient is less active now spends let much less time on her feet and I think this is contributed to the general improvement in the wound condition 2/7; both wounds appear smaller. I was hopeful the right medial would be closed however there there is still the same small open area. Slight amount of surface eschar on the left the dimensions are smaller there is eschar but the wound edges appear to be free. We have been using silver alginate under Unna boot's 2/14; both wounds once again measure smaller. Circumferential eschar on the left medial. We have been using silver alginate under Unna boots with gradual improvement 2/21; the area on the right medial malleolus has healed. The area on the left is smaller. We have been using silver alginate and Unna boots. We can discharge wrapping the right leg she has 20/30 stockings at home she will need to protect the scar tissue in this area 2/28; the area on the right medial malleolus remains closed the patient has a compression stocking. The area on the left is smaller. We have been using silver alginate and Unna boots. 3/6 the area on the right medial ankle remains closed. Good edema control noted she is using her own compression stocking. The area on the left medial ankle is smaller. We have been managing this with silver alginate and Unna boots which we will continue today. 3/13; the area on the right medial ankle remains closed and I'm declaring it healed today. When necessary the left is about the same still a healthy-looking surface but no major  change and wound area. No evidence of infection and using silver alginate under unna and generally making considerable improvement 3/27 the area on the right medial ankle remains closed the area on the left is about the same as last week. Certainly not any worse we have been using silver alginate under an Unna boot 4/3; the area on the right medial ankle remains closed per the patient. We did not look at this wound. The wound on the left medial ankle is about the same surface looks healthy we have been using silver alginate under an Unna boot 4/10; area on the right medial ankle remains closed per the patient. We did not look at this wound. The wound on the left medial ankle is slightly larger. The patient complains that the [facility] caused burning pain all week. She also told us that she was a lot more active this week. Changed  her back to silver alginate 4/17; right medial ankle still closed per the patient. Left medial ankle is slightly larger. Using silver alginate. She did not tolerate Hydrofera Blue on this area 4/24; right medial ankle remains closed we have not look at this. The left medial ankle continues to get larger today by about a centimeter. We have been using silver alginate under Unna boots. She complains about 4 layer compression as an alternative. She has been up on her feet working on her garden 5/8; right medial ankle remains closed we did not look at this. The left medial ankle has increased in size about 100%. We have been using silver alginate under Unna boots. She noted increased pain this week and was not surprised that the wound is deteriorated 5/15; no major change in SA however much less erythema ( one week of doxy ocellulitis). 5/22-64 year old female returns at 1 week to the clinic for left medial ankle wound for which we have been using silver alginate under 3 layer compression She was placed on DOXY at last visit - the wound is wider at this visit. She is in 3  layer compression 5/29; change to [facility] last week. I had given her empiric doxycycline 2 weeks ago for a week. She is in 3 layer compression. She complains of a lot of pain and drainage on presentation today. 6/5; using Hydrofera Blue. I gave her doxycycline recently empirically for erythema and pain around the wound. Believe her cultures showed enterococcus which not would not have been well covered by doxycycline nevertheless the wound looks better and I don't feel specifically that the enterococcus needs to be covered. She has a new what looks like a wrap injury on her lateral left ankle. 6/12; she is using Hydrofera Blue. She has a new area on the left anterior lower tibial area. This was a wrap injury last week. 6/19; the patient is using Hydrofera Blue. She arrived with marked inflammation and erythema around the wound and tenderness. 12/01/18 on evaluation today patient appears to be doing a little bit better based on what I'm hearing from the standpoint of lassos evaluation to this as far as the overall appearance of the wound is concerned. Then sometime substandard she typically sees Dr. Dellia Nims. Nonetheless overall very pleased with the progress that she's made up to this point. No fevers, chills, nausea, or vomiting noted at this time. 7/10; some improvement in the surface area. Aggressively debrided last week apparently. I went ahead with the debridement today although the patient does not tolerate this very well. We have been using Iodoflex. Still a fair amount of drainage 7/17; slightly smaller. Using Iodoflex. 7/24; no change from last week in terms of surface area. We have been using Iodoflex. Surface looks and continues to look somewhat better 7/31; surface area slightly smaller better looking surface. We have been using Iodoflex. This is under Unna boot compression 8/7-Patient presents at 1 week with Unna boot and Iodoflex, wound appears better 8/14-Patient presents at 1 week  with Iodoflex, we use the Unna boot, wound appears to be stable better.Patient is getting Botox treatment for the inversion of the foot for tendon release, Next week 8/21; we are using Iodoflex. Unna boot. The wound is stable in terms of surface area. Under illumination there is some areas of the wound that appear to be either epithelialized or perhaps this is adherent slough at this point I was not really clear. It did not wipe off and I was reluctant to  debride this today. 8/28; we are using Iodoflex in an Unna boot. Seems to be making good improvement. 9/4; using Iodoflex and wound is slightly smaller. 9/18; we are using Iodoflex with topical silver nitrate when she is here. The wound continues to be smaller 10/2; patient missed her appointment last week due to GI issues. She left and Iodoflex based dressing on for 2 weeks. Wound is about the same size about the size of a dime on the left medial lower 10/9 we have been using Iodoflex on the medial left ankle wound. She has a new superficial probable wrap injury on the dorsal left ankle 10/16; we have been using Hydrofera Blue since last week. This is on the left medial ankle 10/23; we have been using Hydrofera Blue since 2 weeks ago. This is on the left medial ankle. Dimensions are better 11/6; using Hydrofera Blue. I think the wound is smaller but still not closed. Left medial ankle 11/13; we have been using Hydrofera Blue. Wound is certainly no smaller this week. Also the surface not as good. This is the remanent of a very large area on her left medial ankle. 11/20; using Sorbact since last week. Wound was about the same in terms of size although I was disappointed about the surface debris 12/11; 3-week follow-up. Patient was on vacation. Wound is measuring slightly larger we have been using Sorbact. 12/18; wound is about the same size however surface looks better last week after debridement. We have been using Sorbact under compression 1/15  wound is probably twice the size of last time increased in length nonviable surface. We have been using Sorbact. She was running a mild fever and missed her appointment last week 1/22; the wound is come down in size but under illumination still a very adherent debris we have been Hydrofera Blue that I changed her to last week 1/29; dimensions down slightly. We have been using Hydrofera Blue 2/19 dimensions are the same however there is rims of epithelialization under illumination. Therefore more the surface area may be epithelialized 2/26; the patient's wound actually measures smaller. The wound looks healthy. We have been using Hydrofera Blue. I had some thoughts about running Apligraf then I still may do that however this looks so much better this week we will delay that for now 3/5; the wound is small but about the same as last week. We have been using Hydrofera Blue. No debridement is required today. 3/19; the wound is about the size of a dime. Healthy looking wound even under illumination. We have been using Hydrofera Blue. No mechanical debridement is necessary 3/26; not much change from last week although still looks very healthy. We have been using Hydrofera Blue under Unna boots Patient was offered an ankle fusion by podiatry but not until the wound heals with a proceed with this. 4/9; the patient comes in today with her original wound on the medial ankle looking satisfactory however she has some uncontrolled swelling in the middle part of her leg with 2 new open areas superiorly just lateral to the tibia. I think this was probably a wrap issue. She said she felt uncomfortable during the week but did not call in. We have been using Hydrofera Blue 4/16; the wound on the medial ankle is about the same. She has innumerable small areas superior to this across her mid tibia. I think this is probably folliculitis. She is also been working in the yard doing a lot of sweating 4/30; the patient  issue on  the upper areas across her mid tibia of all healed. I think this was excessive yard work if I remember. Her wound on the medial ankle is smaller. Some debris on this we have been using Hydrofera Blue under Unna boots 5/7; mid tibia. She has been using Hydrofera Blue under an Unna wrap. She is apparently going for her ankle surgery on June 3 10/28/19-Patient returns to clinic with the ankle wound, we are using Hydrofera Blue under Unna wrap, surgery is scheduled for her left foot for June 3 so she will be back for nurse visit next week READMISSION 01/17/2020 Mrs. Haggard is a 64 year old woman we have had in this clinic for a long period of time with severe venous hypertension and refractory wounds on her medial lower legs and ankles bilaterally. 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 When she was off her legs for a prolonged period example when she fell and suffered a compression fracture things would heal up quite nicely. She is now retired and we managed to heal up the right medial leg wound. The left one was very tiny last time I saw this although still refractory. 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 It got to the point where this was interfering with her walking and she elected to proceed with a ankle arthrodesis to straighten her her ankle and leave her with a functional outcome for mobilization. The patient was referred to Dr. Doren Custard and really this took some time to arrange. Dr. Doren Custard saw her on 12/07/2019. Once again he verified that she had no arterial issues. She had previously had an angiogram several years ago. Follow-up ABIs on the left showed an ABI of 1.12 with triphasic waveforms and a TBI of 0.92. She is felt to have chronic deep venous insufficiency but I do  not think it was felt that anything could be done from about this from an ablation point of view. At the time Dr. Doren Custard saw this patient the wounds actually look closed via the pictures in his clinic. The patient finally underwent her surgery on 12/15/2019. This went reasonably well and there was a good anatomic outcome. She developed a small distal wound dehiscence on the lateral part of the surgical wound. However more problematically she is developed recurrence of the wound on the medial left ankle. There are actually 2 wounds here one in the distal lower leg and 1 pretty much at the level of the medial malleolus. It is a more distal area that is more problematic. She has been using Hydrofera Blue which started on Friday before this she was simply Ace wrapping. There was a culture done that showed Pseudomonas and she is on ciprofloxacin. A recent CNS on 8/11 was negative. The patient reports some pain but I generally think this is improving. She is using a cam boot completely nonweightbearing using a walker for pivot transfers and a wheelchair 8/24; not much improvement unfortunately she has a surgical wound on the lateral part in the venous insufficiency wound medially. The bottom part of the medial insufficiency wound is still necrotic there is exposed tendon here. We have been using Hydrofera Blue under compression. Her edema control is however better 8/31; patient in for follow-up of his surgical wound on the lateral part of her left  leg and chronic venous insufficiency ulcers medially. We put her back in compression last week. She comes in today with a complaint of 3 or 4 days worth of increasing pain. She felt her cam walker was rubbing on the area on the back of her heel. However there is intense erythema seems more likely she has cellulitis. She had 2 cultures done when she was seeing podiatry in the postop. One of them in late July showed Pseudomonas and she received a course of  ciprofloxacin the other was negative on 8/11 she is allergic to penicillin with anaphylactoid complaints of hives oral swelling via information in epic 9/9; when I saw this patient last week she had intense anterior erythema around her wound on the right lateral heel and ankle and also into the right medial heel. Some of this was no doubt drainage and her walker boot however I was convinced she had cellulitis. I gave her Levaquin and Bactrim she is finishing up on this now. She is following up with Dr. [name] he saw her yesterday. He is taken her out of the walking boot of course she is still nonweightbearing. Her x-ray was negative for any worrisome features such as soft tissue air etc. Things are a lot better this week. She has home health. We have been using Hydrofera Blue under an [employer] which she put back on yesterday. I did not wrap her last week Electronic Signature(s) Signed: 02/10/2020 1:37:47 PM By: Linton Ham MD Entered By: Linton Ham on 02/09/2020 12:55:22 -------------------------------------------------------------------------------- Physical Exam Details Patient Name: Date of Service: [name], [name] 02/09/2020 10:00 A M Medical Record Number: [id number] Patient Account Number: [id number] Date of Birth/Sex: Treating RN: 06-12-1955 (64 y.o. [name] Primary Care Provider: [name] Other Clinician: Referring Provider: Treating Provider/Extender: [name] in Treatment: 3 Constitutional Sitting or standing Blood Pressure is within target range for patient.. Pulse regular and within target range for patient.Marland Kitchen Respirations regular, non-labored and within target range.. Temperature is normal and within the target range for the patient.Marland Kitchen Appears in no distress. Notes Wound exam; left medial lower leg ankle looks quite healthy except for an area with more depth inferiorly. Here there is some debris I used a #3 curette on this. Were  going to have to get some of this removed so we can get some epithelialization in the area. On the lateral side everything is closed Everything looks a lot better this week there is no erythema. Electronic Signature(s) Signed: 02/10/2020 1:37:47 PM By: Linton Ham MD Entered By: Linton Ham on 02/09/2020 12:56:21 -------------------------------------------------------------------------------- Physician Orders Details Patient Name: Date of Service: [name], [name] 02/09/2020 10:00 A M Medical Record Number: [id number] Patient Account Number: [id number] Date of Birth/Sex: Treating RN: 1956/04/17 (64 y.o. [name], [name] Primary Care Provider: [name] Other Clinician: Referring Provider: Treating Provider/Extender: [name] in Treatment: 3 Verbal / Phone Orders: No Diagnosis Coding ICD-10 Coding Code Description [phone] Chronic venous hypertension (idiopathic) with ulcer and inflammation of left lower extremity L97.828 Non-pressure chronic ulcer of other part of left lower leg with other specified severity L03.116 Cellulitis of left lower limb L97.328 Non-pressure chronic ulcer of left ankle with other specified severity T81.31XD Disruption of external operation (surgical) wound, not elsewhere classified, subsequent encounter Follow-up Appointments Return Appointment in 1 week. Dressing Change Frequency Other: - home health to change dressing twice a week. Skin Barriers/Peri-Wound Care Moisturizing lotion Wound Cleansing May shower  with protection. - use cast protector Primary Wound Dressing Wound #15 Left,Medial Malleolus Silver Collagen - moisten with hydrogel. Secondary Dressing Wound #15 Left,Medial Malleolus Dry Gauze ABD pad Edema Control Unna Boot to Left Lower Extremity Avoid standing for long periods of time Elevate legs to the level of the heart or above for 30 minutes daily and/or when sitting, a frequency of: -  throughout the day Off-Loading Other: - ensure no pressure to wound areas. [place] skilled nursing for wound care. - [facility] home [facility] to change twice a week. Electronic Signature(s) Signed: 02/09/2020 5:40:39 PM By: Deon Pilling Signed: 02/10/2020 1:37:47 PM By: Linton Ham MD Entered By: Deon Pilling on 02/09/2020 11:37:38 -------------------------------------------------------------------------------- Problem List Details Patient Name: Date of Service: [name], [name] 02/09/2020 10:00 A M Medical Record Number: [id number] Patient Account Number: [id number] Date of Birth/Sex: Treating RN: 08-08-1955 (64 y.o. [name] Primary Care Provider: [name] Other Clinician: Referring Provider: Treating Provider/Extender: [name] in Treatment: 3 Active Problems ICD-10 Encounter Code Description Active Date MDM Diagnosis I87.332 Chronic venous hypertension (idiopathic) with ulcer and inflammation of left 01/17/2020 No Yes lower extremity L97.828 Non-pressure chronic ulcer of other part of left lower leg with other specified 01/17/2020 No Yes severity L03.116 Cellulitis of left lower limb 01/31/2020 No Yes L97.328 Non-pressure chronic ulcer of left ankle with other specified severity 01/17/2020 No Yes T81.31XD Disruption of external operation (surgical) wound, not elsewhere classified, 01/17/2020 No Yes subsequent encounter Inactive Problems Resolved Problems Electronic Signature(s) Signed: 02/09/2020 5:40:39 PM By: Deon Pilling Signed: 02/10/2020 1:37:47 PM By: Linton Ham MD Entered By: Deon Pilling on 02/09/2020 10:13:53 -------------------------------------------------------------------------------- Progress Note Details Patient Name: Date of Service: [name], [name] 02/09/2020 10:00 A M Medical Record Number: [id number] Patient Account Number: [id number] Date of Birth/Sex: Treating RN: Mar 20, 1956 (63  y.o. [name] Primary Care Provider: [name] Other Clinician: Referring Provider: Treating Provider/Extender: [name] in Treatment: 3 Subjective History of Present Illness (HPI) the remaining wound is over the left medial ankle. Similar wound over the right medial ankle healed largely with use of Apligraf. Most recently we have been using Hydrofera Blue over this wound with considerable improvement. The patient has been extensively worked up in the past for her venous insufficiency and she is not a candidate for antireflux surgery although I have none of the details available currently. 08/24/14; considerable improvement today. About 50% of this wound areas now epithelialized. The base of the wound appears to be healthier granulation.as opposed to last week when she had deteriorated a considerable improvement 08/17/14; unfortunately the wound has regressed somewhat. The areas of epithelialization from the superior aspect are not nearly as healthy as they were last week. The patient thinks her Hydrofera Blue slipped. 09/07/14; unfortunately the area has markedly regressed in the 2 weeks since I've seen this. There is an odor surrounding erythema. The healthy granulation tissue that we had at the base of the wound now is a dusky color. The nurse reports green drainage 09/14/14; the area looks somewhat better than last week. There is less erythema and less drainage. The culture I did did not show any growth. Nevertheless I think it is better to continue the Cipro and doxycycline for a further week. The remaining wound area was debridement. 09/21/14. Wound did not require debridement last week. Still less erythema and less drainage. She can complete her antibiotics. The areas of epithelialization in  the superior aspect of the wound do not look as healthy as they did some weeks ago 10/05/14 continued improvement in the condition of this wound. There is advancing  epithelialization. Less aggressive debridement required 10/19/14 continued improvement in the condition and volume of this wound. Less aggressive debridement to the inferior part of this to remove surface slough and fibrinous eschar 11/02/14 no debridement is required. The surface granulation appears healthy although some of her islands of epithelialization seem to have regressed. No evidence of infection 11/16/14; lites surface debridement done of surface eschar. The wound does not look to be unhealthy. No evidence of infection. Unfortunately the patient has had podiatry issues in the right foot and for some reason has redeveloped small surface ulcerations in the medial right ankle. Her original presentation involved wounds in this area 11/23/14 no debridement. The area on the right ankle has enlarged. The left ankle wound appears stable in terms of the surface although there is periwound inflammation. There has been regression in the amount of new skin 11/30/14 no debridement. Both wound areas appear healthy. There was no evidence of infection. The the new area on the right medial ankle has enlarged although that both the surfaces appear to be stable. 12/07/14; Debridement of the right medial ankle wound. No no debridement was done on the left. 12/14/14 no major change in and now bilateral medial ankle wounds. Both of these are very painful but the no overt evidence of infection. She has had previous venous ablation 12/21/14; patient states that her right medial ankle wound is considerably more painful last week than usual. Her left is also somewhat painful. She could not tolerate debridement. The right medial ankle wound has fibrinous surface eschar 12/28/14 this is a patient with severe bilateral venous insufficiency ulcers. For a considerable period of time we actually had the one on the right medial ankle healed however this recently opened up again in June. The left medial ankle wound has been a  refractory area with some absent flows. We had some success with Hydrofera Blue on this area and it literally closed by 50% however it is recently opened up Foley. Both of these were debridement today of surface eschar. She tolerates this poorly 01/25/15: No change in the status of this. Thick adherent escar. Very poor tolerance of any attempt at debridement. I had healed the right medial malleolus wound for a considerable amount of time and had the left one down to about 50% of the volume although this is totally regressed over the last 48 weeks. Further the right leg has reopened. she is trying to make a appointment with pain and vascular, previous ablations with Dr. Aleda Grana. I do not believe there is an arterial insufficiency issue here 02/01/15 the status of the adherent eschar bilaterally is actually improved. No debridement was done. She did not manage to get vascular studies done 02/08/15 continued debridement of the area was done today. The slough is less adherent and comes off with less pressure. There is no surrounding infection peripheral pulses are intact 02/15/15 selective debridement with a disposable curette. Again the slough is less adherent and comes off with less difficulty. No surrounding infection peripheral pulses are intact. 02/22/15 selective debridement of the right medial ankle wound. Slough comes off with less difficulty. No obvious surrounding infection peripheral pulses are intact I did not debridement the one on the left. Both of these are stable to improved 03/01/15 selective debridement of both wound areas using a curette to. Adherent slough  cup soft with less difficulty. No obvious surrounding infection. The patient tells me that 2 days ago she noted a rash above the right leg wrap. She did not have this on her lower legs when she change this over she arrives with widespread left greater than right almost folliculitis-looking rash which is extremely pruritic. I don't see  anything to culture here. There is no rash on the rest of her body. She feels well systemically. 03/08/15; selective debridement of both wounds using a curette. Base of this does not look unhealthy. She had limegreen drainage coming out of the left leg wound and describes a lot of drainage. The rash on her left leg looks improved to. No cultures were done. 03/22/15; patient was not here last week. Basal wounds does not look healthy and there is no surrounding erythema. No drainage. There is still a rash on the left leg that almost looks vasculitic however it is clearly limited to the top of where the wrap would be. 04/05/15; on the right required a surgical debridement of surface eschar and necrotic subcutaneous tissue. I did not debridement the area on the left. These continue to be large open wounds that are not changing that much. We were successful at one point in healing the area on the right, and at the same time the area on the left was roughly half the size of current measurements. I think a lot of the deterioration has to do with the prolonged time the patient is on her feet at work 04/19/15 I attempted-like surface debridement bilaterally she does not tolerate this. She tells me that she was in allergic care yesterday with extreme pain over her left lateral malleolus/ankle and was told that she has an "sprain" 05/03/15; large bilateral venous insufficiency wounds over the medial malleolus/medial aspect of her ankles. She complains of copious amounts of drainage and his usual large amounts of pain. There is some increasing erythema around the wound on the right extending into the medial aspect of her foot to. historically she came in with these wounds the right one healed and the left one came down to roughly half its current size however the right one is reopened and the left is expanded. 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 05/10/15  large bilateral venous insufficiency wounds. There is less adherence surface left however the surface culture that I did last week grew pseudomonas therefore bilateral selective debridement score necessary. There is surrounding erythema. The patient describes severe bilateral drainage and a lot of pain in the left ankle. Apparently her podiatrist was were ready to do a cortisone shot 05/17/15; the patient complains of pain and again copious amounts of drainage. 05/24/15; we used Iodo flex last week. Patient notes considerable improvement in wound drainage. Only needed to change this once. 05/31/15; we continued Iodoflex; the base of these large wounds bilaterally is not too bad but there is probably likely a significant bioburden here. I would like to debridement just doesn't tolerate it. 06/06/14 I would like to continue the Iodoflex although she still hasn't managed to obtain supplies. She has bilateral medial malleoli or large wounds which are mostly superficial. Both of them are covered circumferentially with some nonviable fibrinous slough although she tolerates debridement very poorly. She apparently has an appointment for an ablation on the right leg by interventional radiology. 06/14/15; the patient arrives with the wounds and static condition. We attempted a debridement  although she does not do well with this secondary to pain. I 07/05/15; wounds are not much smaller however there appears to be a cleaner granulating base. The left has tight fibrinous slough greater than the right. Debridement is tolerated poorly due to pain. Iodoflex is done more for these wounds in any of the multitude of different dressings I have tried on the left 1 and then subsequently the right. 07/12/15; no change in the condition of this wound. I am able to do an aggressive debridement on the right but not the left. She simply cannot tolerate it. We have been using Iodoflex which helps somewhat. It is worthwhile remembering  that at one point we healed the right medial ankle wound and the left was about 25% of the current circumference. We have suggested returning to vascular surgery for review of possible further ablations for one reason or another she has not been able to do this. 07/26/15 no major change in the condition of either wound on her medial ankle. I did not attempt to debridement of these. She has been aggressively scrubbing these while she is in the shower at home. She has her supply of Iodoflex which seems to have done more for these wounds then anything I have put on recently. 08/09/15 wound area appears larger although not verified by measurements. Using Iodoflex 09/05/2015 -- she was here for avisit today but had significant problems with the wound and I was asked to see her for a physician opinion. I have summarize that this lady has had surgery on her left lower extremity about 10 years ago where the possible veins stripping was done. She has had an opinion from interventional radiology around November 2016 where no further sclerotherapy was ordered. The patient works 12 hours a day and stands on a concrete floor with work boots and is unable to get the proper compression she requires and cannot elevate her limbs appropriately at any given time. She has recently grown Pseudomonas from her wound culture but has not started her ciprofloxacin which was called in for her. 09/13/15 this continues to be a difficult situation for this patient. At one point I had this wound down to a 1.5 x 1.5" wound on her left leg. This is deteriorated and the right leg has reopened. She now has substantial wounds on her medial calcaneus, malleoli and into her lower leg. One on the left has surface eschar but these are far too painful for me to debridement here. She has a vascular surgery appointment next week to see if anything can be done to help here. I think she has had previous ablations several years ago at [facility].  She has no major edema. She tells me that she did not get product last time [facility] Ag] and went for several days without it. She continues to work in work boots 12 hours a day. She cannot get compression/4-layer under her work boots. 09/20/15 no major change. Periwound edema control was not very good. Her point with pain and vascular is next Wednesday the 25th 09/28/15; the patient is seen vascular surgery and is apparently scheduled for repeat duplex ultrasounds of her bilateral lower legs next week. 10/05/15; the patient was seen by Dr. Doren Custard of vascular surgery. He feels that she should have arterial insufficiency excluded as cause/contributed to her nonhealing stage she is therefore booked for an arteriogram. She has apparently monophasic signals in the dorsalis pedis pulses. She also of course has known severe chronic venous insufficiency with  previous procedures as noted previously. I had another long discussion with the patient today about her continuing to work 12 hour shifts. I've written her out for 2 months area had concerns about this as her work location is currently undergoing significant turmoil and this may lead to her termination. She is aware of this however I agree with her that she simply cannot continue to stand for 12 hours multiple days a week with the substantial wound areas she has. 10/19/15; the Dr. Doren Custard appointment was largely for an arteriogram which was normal. She does not have an arterial issue. He didn't make a comment about her chronic venous insufficiency for which she has had previous ablations. Presumably it was not felt that anything additional could be done. The patient is now out of work as I prescribed 2 weeks ago. Her wounds look somewhat less aggravated presumably because of this. I felt I would give debridement another try today 10/25/15; no major change in this patient's wounds. We are struggling to get her product that she can afford into her own home through  her insurance. 11/01/15; no major change in the patient's wounds. I have been using silver alginate as the most affordable product. I spoke to Dr. Marla Roe last week with her requested take her to the OR for surgical debridement and placement of ACEL. Dr. Marla Roe told me that she would be willing to do this however [facility] will not cover this, fortunately the patient has [place] healthcare of some variant 11/08/15; no major change in the patient's wounds. She has been completely nonviable surface that this but is in too much pain with any attempted debridement are clinic. I have arranged for her to see Dr. Marla Roe ham of plastic surgery and this appointment is on Monday. I am hopeful that they will take her to the OR for debridement, possible ACEL ultimately possible skin graft 11/22/15 no major change in the patient's wounds over her bilateral medial calcaneus medial malleolus into the lower legs. Surface on these does not look too bad however on the left there is surrounding erythema and tenderness. This may be cellulitis or could him sleepy tinea. 11/29/15; no major changes in the patient's wounds over her bilateral medial malleolus. There is no infection here and I don't think any additional antibiotics are necessary. There is now plan to move forward. She sees Dr. Marla Roe in a week's time for preparation for operative debridement and ACEL placement I believe on 7/12. She then has a follow-up appointment with Dr. Marla Roe on 7/21 12/28/15; the patient returns today having been taken to the [place] by Dr. Marla Roe 12/12/15 she underwent debridement, intraoperative cultures [which were negative]. She had placement of a wound VAC. Parent really ACEL was not available to be placed. The wound VAC foam apparently adhered to the wound since then she's been using silver alginate, Xeroform under Ace wraps. She still says there is a lot of drainage and a lot of pain 01/31/16; this is a  patient I see monthly. I had referred her to Dr. Marla Roe him of plastic surgery for large wounds on her bilateral medial ankles. She has been to the OR twice once in early July and once in early August. She tells me over the last 3 weeks she has been using the wound VAC with ACEL underneath it. On the right we've simply been using silver alginate. Under Kerlix Coban wraps. 02/28/16; this is a patient I'm currently seeing monthly. She is gone on to have  a skin graft over her large venous insufficiency ulcer on the left medial ankle. This was done by Dr. Marla Roe him. The patient is a bit perturbed about why she didn't have one on her right medial ankle wound. She has been using silver alginate to this. 03/06/16; I received a phone call from her plastic surgery Dr. Marla Roe. She expressed some concern about the viability of the skin graft she did on the left medial ankle wound. Asked me to place Endoform on this. She told me she is not planning to do a subsequent skin graft on the right as the left one did not take very well. I had placed Hydrofera Blue on the right 03/13/16; continue to have a reasonably healthy wound on the right medial ankle. Down to 3 mm in terms of size. There is epithelialization here. The area on the left medial ankle is her skin graft site. I suppose the last week this looks somewhat better. She has an open area inferiorly however in the center there appears to be some viable tissue. There is a lot of surface callus and eschar that will eventually need to come off however none of this looked to be infected. Patient states that the is able to keep the dressing on for several days which is an improvement. 03/20/16 no major change in the circumference of either wound however on the left side the patient was at Dr. Eusebio Friendly office and they did a debridement of left wound. 50% of the wound seems to be epithelialized. I been using Endoform on the left Hydrofera Blue in the  right 03/27/16; she arrives today with her wound is not looking as healthy as they did last week. The area on the right clearly has an adherent surface to this a very similar surface on the left. Unfortunately for this patient this is all too familiar problem. Clearly the Endoform is not working and will need to change that today that has some potential to help this surface. She does not tolerate debridement in this clinic very well. She is changing the dressing wants 04/03/16; patient arrives with the wounds looking somewhat better especially on the right. Dr. Migdalia Dk change the dressing to silver alginate when she saw her on Monday and also sold her some compression socks. The usefulness of the latter is really not clear and woman with severely draining wounds. 04/10/16; the patient is doing a bit of an experiment wearing the compression stockings that Dr. Migdalia Dk provided her to her left leg and the out of legs based dressings that we provided to the right. 05/01/16; the patient is continuing to wear compression stockings Dr. Migdalia Dk provided her on the left that are apparently silver impregnated. She has been using Iodoflex to the right leg wound. Still a moderate amount of drainage, when she leaves here the wraps only last for 4 days. She has to change the stocking on the left leg every night 05/15/16; she is now using compression stockings bilaterally provided by Dr. Marla Roe. She is wearing a nonadherent layer over the wounds so really I don't think there is anything specific being done to this now. She has some reduction on the left wound. The right is stable. I think all healing here is being done without a specific dressing 06/09/16; patient arrives here today with not much change in the wound certainly in diameter to large circular wounds over the medial aspect of her ankle bilaterally. Under the light of these services are certainly not viable for healing.  There is no evidence of surrounding  infection. She is wearing compression stockings with some sort of silver impregnation as prescribed by Dr. Marla Roe. She has a follow-up with her tomorrow. 06/30/16; no major change in the size or condition of her wounds. These are still probably covered with a nonviable surface. She is using only her purchase stockings. She did see Dr. Marla Roe who seemed to want to apply Dakin's solution to this I'm not extreme short what value this would be. I would suggest Iodoflex which she still has at home. 07/28/16; I follow Mrs. Brys episodically along with Dr. Marla Roe. 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 She has been applying a topical collagen ointment to both wounds with Adaptic. I don't think Dr. Marla Roe is planning to take her back to the OR. 08/19/16; I follow Mrs. Jeneen Rinks on a monthly basis along with Dr. Marla Roe of plastic surgery. 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 I been following her for a number of years. At one point I was able to get the right medial malleolus wound to heal and had the left medial malleolus down to about half its current size however and I had to send her to plastic surgery for an operative debridement. Since then things have been stable to slightly improve the area on the right is slightly better one in the left about the same although there is much less adherent surface than I'm used to with this patient. She is using some form of liquid collagen gel that Dr. Marla Roe provided a Kerlix cover with the patient's own pressure stockings. She tells me that she has extreme pain in both ankles and along the lateral aspect of both feet. She has been unable to work for some period of time. She is telling me she is retiring at the beginning of April. She sees Dr. Doran Durand of orthopedics next week 09/22/16; patient has not seen Dr.  Marla Roe since the last time she is here. I'm not really sure what she is using to the wounds other than bits and pieces of think she had left over including most recently Hydrofera Blue. She is using juxtalite stockings. She is having difficulty with her husband's recent illness "stroke". She is having to transport him to various doctors appointments. Dr. Marla Roe left her the option of a repeat debridement with ACEL however she has not been able to get the time to follow-up on this. She continues to have a fair amount of drainage out of these wounds with certainly precludes leaving dressings on all week 10/13/16; patient has not seen Dr. Marla Roe since she was last in our clinic. I'm not really sure what she is doing with the wounds, we did try to get her [facility] and I think she is actually using this most of the time. Because of drainage she states she has to change this every second day although this is an improvement from what she used to do. She went to see Dr. Doran Durand who did not think she had a muscular issue with regards to her feet, he referred her to a neurologist and I think the appointment is sometime in June. I changed her back to Iodoflex which she has used in the past but not recently. 11/03/16; the patient has been using Iodoflex although she ran out of this. Still claims that there is a lot of drainage although the wound does  not look like this. No surrounding erythema. She has not been back to see Dr. Marla Roe 11/24/16; the patient has been using Iodoflex again but she ran out of it 2 or 3 days ago. There is no major change in the condition of either one of these wounds in fact they are larger and covered in a thick adherent surface slough/nonviable tissue especially on the left. She does not tolerate mechanical debridement in our clinic. Going back to see Dr. Marla Roe of plastic surgery for an operative debridement would seem reasonable. 12/15/16; the patient has not been  back to see Dr. Marla Roe. She is been dealing with a series of illnesses and her husband which of monopolized her time. She is been using Sorbact which we largely supplied. She states the drainage is bad enough that it maximum she can go 2-3 days without changing the dressing 01/12/2017 -- the patient has not been back for about 4 weeks and has not seen Dr. Marla Roe not does she have any appointment pending. 01/23/17; patient has not seen Dr. Marla Roe even though I suggested this previously. She is using Santyl that was suggested last week by Dr. Con Memos this Cost her $16 through her insurance which is indeed surprising 02/12/17; continuing Santyl and the patient is changing this daily. A lot of drainage. She has not been back to see plastic surgery she is using an Ace wrap. Our intake nurse suggested wrap around stockings which would make a good reasonable alternative 02/26/17; patient is been using Santyl and changing this daily due to drainage. She has not been to see plastic surgery she uses in April Ace wrap to control the edema. She did obtain extremitease stockings but stated that the edema in her leg was to big for these 03/20/17; patient is using Santyl and Anasept. Surfaces looked better today the area on the right is actually measuring a little smaller. She has states she has a lot of pain in her feet and ankles and is asking for a consult to pain control which I'll try to help her with through our case manager. 04/10/17; the patient arrives with better-looking wound surfaces and is slightly smaller wound on the left she is using a combination of Santyl and Anasept. She has an appointment or at least as started in the pain control center associated with [facility] 05/14/17; this is a patient who I followed for a prolonged period of time. 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 At one point I had this down to a much smaller wound on the left however these  reopened and we've never been able to get these to heal. She has been using Santyl and Anasept gel although 2 weeks ago she ran out of the Anasept gel. She has a stable appearance of the wound. She is going to the wound care clinic at [facility] They wanted do a nerve block/spinal block although she tells me she is reluctant to go forward with that. 05/21/17; this is a patient I have followed for many years. 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 Chronic pain and deformity in her ankles as well. She is been to see plastic surgery as well as orthopedics. Using PolyMem AG most recently/Kerramax/ABDs and 2 layer compression. She has managed to keep this on and she is coming in for a nurse check to change the dressing on Tuesdays, we see her on Fridays 06/05/17; really quite a good looking surface and the area especially on  the right medial has contracted in terms of dimensions. Well granulated healthy-looking tissue on both sides. Even with an open curet there is nothing that even feels abnormal here. This is as good as I've seen this in quite some time. We have been using PolyMem AG and bringing her in for a nurse check 06/12/17; really quite good surface on both of these wounds. The right medial has contracted a bit left is not. We've been using PolyMem and AG and she is coming in for a nurse visit 06/19/17; we have been using PolyMem AG and bringing her in for a nurse check. Dimensions of her wounds are not better but the surfaces looked better bilaterally. She complained of bleeding last night and the left wound and increasing pain bilaterally. She states her wound pain is more neuropathic than just the wounds. There was some suggestion that this was radicular from her pain management doctor in talking to her it is really difficult to sort this out. 06/26/17; using PolyMem and AG and bringing her in for a nurse check as All of this and reasonably stable condition.  Certainly not improved. The dimensions on the lateral part of the right leg look better but not really measuring better. The medial aspect on the left is about the same. 07/03/16; we have been using PolyMen AG and bringing her in for a nurse check to change the dressings as the wounds have drainage which precludes once weekly changing. We are using all secondary absorptive dressings.our intake nurse is brought up the idea of using a wound VAC/snap VAC on the wound to help with the drainage to see if this would result in some contraction. This is not a bad idea. The area on the right medial is actually looking smaller. Both wounds have a reasonable-looking surface. There is no evidence of cellulitis. The edema is well controlled 07/10/17; the patient was denied for a snap VAC by her insurance. The major issue with these wounds continues to be drainage. We are using wicked PolyMem AG and she is coming in for a nurse visit to change this. The wounds are stable to slightly improved. The surface looks vibrant and the area on the right certainly has shrunk in size but very slowly 07/17/17; the patient still has large wounds on her bilateral medial malleoli. Surface of both of these wounds looks better. The dimensions seem to come and go but no consistent improvement. There is no epithelialization. We do not have options for advanced treatment products due to insurance issues. They did not approve of the wound VAC to help control the drainage. More recently we've been using PolyMem and AG wicked to allow drainage through. We have been bringing her in for a nurse visit to change this. We do not have a lot of options for wound care products and the home again due to insurance issues 07/24/17; the patient's wound actually looks somewhat better today. No drainage measurements are smaller still healthy-looking surface. We used silver collagen under PolyMen started last week. We have been bringing her in for a dressing  change 07/31/17; patient's wound surface continued to look better and I think there is visible change in the dimensions of the wound on the right. Rims of epithelialization. We have been using silver collagen under PolyMen and bringing her in for a dressing change. There appears to be less drainage although she is still in need of the dressing change 08/07/17. Patient's wound surface continues to look better on both sides and  the area on the right is definitely smaller. We have been using silver collagen and PolyMen. She feels that the drainage has been it has been better. I asked her about her vascular status. She went to see Dr. Aleda Grana at [facility] and had some form of ablation. I don't have much detail on this. I haven't my notes from 2016 that she was not a candidate for any further ablation but I don't have any more information on this. We had referred her to vein and vascular I don't think she ever went. He does not have a history of PAD although I don't have any information on this either. We don't even have ABIs in our record 08/14/17; we've been using silver collagen and PolyMen cover. And putting the patient and compression. She we are bringing her in as a nurse visit to change this because ofarge amount of drainage. We didn't the ABIs in clinic today since they had been done in many moons 1.2 bilaterally. She has been to see vein and vascular however this was at [facility] and she had ablation although I really don't have any information on this all seemed biking get a report. She is also been operatively debrided by plastic surgery and had a cell placed probably 8-12 months ago. This didn't have a major effect. We've been making some gains with current dressings 08/19/17-She is here in follow-up evaluation for bilateral medial malleoli ulcers. She continues to tolerate debridement very poorly. We will continue with recently changed topical treatment; if no significant improvement  may consider switching to Iodosorb/Iodoflex. 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 She has been using silver collagen and PolyMem. I believe she has been used and tried on Iodoflex before. 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 These have been very refractory over time. We have been using silver collagen and PolyMen. She is coming in once a week for a doctor's and once a week for nurse visits. We are actually making some progress 09/18/17; the patient's wounds are smaller especially on the right medial. She arrives today to upset to consider even washing these off with Anasept which I think is been part of the reason this is been closing. We've been using collagen covered in PolyMen otherwise. It is noted that she has a small area of folliculitis on the right medial calf that. As we are wrapping her legs I'll give her a short course of doxycycline to make sure this doesn't amount to anything. She is a long list of complaints today including imbalance, shortness of breath on exertion, inversion of her left ankle. With regards to the latter complaints she is been to see orthopedics and they offered her a tendon release surgery I believe but wanted her wounds to be closed first. I have recommended she go see her primary physician with regards to everything else. 09/25/17; patient's wounds are about the same size. We have made some progress bilaterally although not in recent weeks. She will not allow me T wash these wounds with Anasept even if she is doing her cell. Wheeze we've been using collagen covered in PolyMen. Last week she had a small area of folliculitis this is now opened into a small wound. She completed 5 days of trimethoprim  sulfamethoxazole 10/02/17; unfortunately  the area on her left medial ankle is worse with a larger wound area towards the Achilles. The patient complains of a lot of pain. She will not allow debridement although visually I don't think there is anything to debridement in any case. We have been using silver collagen and PolyMen for several months now. Initially we are making some progress although I'm not really seeing that today. We will move back to [facility]. His admittedly this is a bit of a repeat however I'm hoping that his situation is different now. The patient tells me she had her leg on the left give out on her yesterday this is process some pain. 10/09/17; the patient is seen twice a week largely because of drainage issues coming out of the chronic medial bimalleolar wounds that are chronic. Last week the dimensions of the one on the left looks a little larger I changed her to [facility]. She comes in today with a history of terrible pain in the bilateral wound areas. She will not allow debridement. She will not even allow a tissue culture. There is no surrounding erythema no no evidence of cellulitis. We have been putting her Kerlix Coban man. She will not allow more aggressive compression as there was a suggestion to put her in 3 layer wraps. 10/16/17; large wounds on her bilateral medial malleoli. These are chronic. Not much change from last week. The surface looks have healthy but absolutely no epithelialization. A lot of pain little less so of drainage. She will not allow debridement or even washing these off in the vigorous fashion with Anasept. 10/23/17; large wounds on her bilateral malleoli which are chronic. Some improvement in terms of size perhaps on the right since last time I saw these. She states that after we increased the 3 layer compression there was some bleeding, when she came in for a nurse visit she did not want 3 layer compression put back on about our nurse managed  to convince her. She has known chronic venous visit issues and I'm hoping to get her to tolerate the 3 layer compression. using Hydrofera Blue 10/30/17; absolutely no change in the condition of either wound although we've had some improvement in dimensions on the right.. Attempted to put her in 3 layer compression she didn't tolerated she is back in 2 layer compression. We've been using Hydrofera Blue We looked over her past records. She had venous reflux studies in November 2016. There was no evidence of deep venous reflux on the right. Superficial vein did not show the greater saphenous vein at think this is been previously ablated the small saphenous vein was within normal limits. The left deep venous system showed no DVT the vessels were positive for deep venous reflux in the posterior tibial veins at the ankle. The greater saphenous vein was surgically absent small saphenous vein was within normal limits. She went to vein and vascular at [facility]. I believe she had an ablation on the left greater saphenous vein. I'll update her reflux studies perhaps ever reviewed by vein and vascular. We've made absolutely no progress in these wounds. Will also try to read and TheraSkins through her insurance 11/06/17; W the patient apparently has a 2 week follow-up with vein and vascular I like him to review the whole issue with regards to her previous vascular workup by Dr. Aleda Grana. We've really made no progress on these wounds in many months. She arrives today with less viable looking surface on the left medial ankle wound.  This was apparently looking about the same on Tuesday when she was here for nurse visit. 11/13/17; deep tissue culture I did last time of the left lower leg showed multiple organisms without any predominating. In particular no Staphylococcus or group A strep were isolated. We sent her for venous reflux studies. She's had a previous left greater saphenous vein stripping and I think  sclerotherapy of the right greater saphenous vein. She didn't really look at the lesser saphenous vein this both wounds are on the medial aspect. She has reflux in the common femoral vein and popliteal vein and an accessory vein on the right and the common femoral vein and popliteal vein on the left. I'm going to have her go to see vein and vascular just the look over things and see if anything else beside aggressive compression is indicated here. We have not been able to make any progress on these wounds in spite of the fact that the surface of the wounds is never look too bad. 11/20/17; no major change in the condition of the wounds. Patient reports a large amount of drainage. She has a lot of complaints of pain although enlisting her today I wonder if some of this at least his neuropathic rather than secondary to her wounds. She has an appointment with vein and vascular on 12/30/17. The refractory nature of these wounds in my mind at least need vein and vascular to look over the wounds the recent reflux studies we did and her history to see if anything further can be done here. I also note her gait is deteriorated quite a bit. 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 She has a bilateral Trendelenburg gait. I wonder if this is neuropathic or perhaps multilevel radicular. 11/27/17; her wounds actually looks slightly better. Healthy-looking granulation tissue a scant amount of epithelialization. [place] [employer] will not pay for [employer]. They will play for tri layer Oasis and Dermagraft. This is not a diabetic ulcer. We'll try for the tri layer Oasis. She still complains of some drainage. She has a vein and vascular appointment on 12/30/17 12/04/17; the wounds visually look quite good. Healthy-looking granulation with some degree of epithelialization. We are still waiting for response to our request for trial to try layer Oasis. Her appointment with vascular to review venous and arterial  issues isn't sold the end of July 7/31. Not allow debridement or even vigorous cleansing of the wound surface. 12/18/17; slightly smaller especially on the right. 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 We've been using Hydrofera Blue. We still are looking into triple layer Oasis through her insurance 01/08/18 on evaluation today patient's wound actually appears to be showing signs of good improvement at this point in time. She has been tolerating the dressing changes without complication. Fortunately there does not appear to be any evidence of infection at this point in time. We have been utilizing silver nitrate which does seem to be of benefit for her which is also good news. Overall I'm very happy with how things seem to be both regards appearance as well as measurement. Patient did see Dr. Bridgett Larsson for evaluation on 12/30/17. In his assessment he felt that stripping would not likely add much more than chronic compression to the patient's healing process. His recommendation was to follow-up in three months with Dr. Doren Custard if she hasn't healed in order to consider referral back to you and see vascular where she previously was in a trial and was able to  get her wound to heal. I'll be see what she feels she when you staying compression and he reiterated this as well. 01/13/18 on evaluation today patient appears to actually be doing very well in regard to her bilateral medial malleolus ulcers. She seems to have tolerated the chemical cauterization with silver nitrate last week she did have some pain through that evening but fortunately states that I'll be see since it seems to be doing better she is overall pleased with the progress. 01/21/18; really quite a remarkable improvement since I've last seen these wounds. We started using silver nitrate specially on the islands of hyper granulation which for some reason her around the wound circumference. This is really done quite nicely.  Primary dressing Hydrofera Blue under 4 layer compression. She seems to be able to hold out without a nurse rewrap. Follow-up in 1 week 01/28/18; we've continued the hydrofera blue but continued with chemical cauterization to the wound area that we started about a month ago for irregular hyper granulation. She is made almost stunning improvement in the overall wound dimensions. I was not really expecting this degree of improvement in these chronic wounds 02/05/18; we continue with Hydrofera Bluebut of also continued the aggressive chemical cauterization with silver nitrate. We made nice progress with the right greater than left wound. 02/12/18. We continued with Hydrofera Blue after aggressive chemical cauterization with silver nitrate. We appear to be making nice progress with both wound areas 02/19/2018; we continue with [facility] after washing the wounds vigorously with Anasept spray and chemical cauterization with silver nitrate. We are making excellent progress. The area on the right's just about closed 02/26/2018. The area on the left medial ankle had too much necrotic debris today. I used a #5 curette we are able to get most of the soft. I continued with the silver nitrate to the much smaller wound on the right medial ankle she had a new area on her right lower pretibial area which she says was due to a role in her compression 03/05/2018; both wound areas look healthy. Not much change in dimensions from last week. I continue to use silver nitrate and Hydrofera Blue. The patient saw Dr. Doren Custard of vein and vascular. He felt she had venous stasis ulcers. He felt based on her previous arteriogram she should have adequate circulation for healing. Also she has deep venous reflux but really no significant correctable superficial venous reflux at this time. He felt we should continue with conservative management including leg elevation and compression 04/02/2018; since we last saw this woman about a  month ago she had a fall apparently suffered a pelvic fracture. I did not look up the x-ray. Nevertheless because of pain she literally was bedbound for 2 weeks and had home health coming out to change the dressing. Somewhat predictably this is resulted in considerable improvement in both wound areas. The right is just about closed on the medial malleolus and the left is about half the size. 04/16/2018; both her wounds continue to go down in size. Using Hydrofera Blue. 05/07/18; both her wounds appeared to be improving especially on the right where it is almost closed. We are using Hydrofera Blue 05/14/2018; slightly worse this week with larger wounds. Surface on the left medial not quite as good. We have been using Hydrofera Blue 05/21/18; again the wounds are slightly larger. Left medial malleolus slightly larger with eschar around the circumference. We have been using Hydrofera Blue undergoing a wraps for a prolonged period of time.  This got a lot better when she was more recumbent due to a fall and a back injury. I change the primary dressing the silver alginate today. She did not tolerate a 4 layer compression previously although I may need to bring this up with her next time 05/28/2018; area on the left medial malleolus again is slightly larger with more drainage. Area on the right is roughly unchanged. She has a small area of folliculitis on the right medial just on the lower calf. This does not look ominous. 06/03/2018 left medial malleolus slightly smaller in a better looking surface. We used silver nitrate on this last time with silver alginate. The area on the right appears slightly smaller 1/10; left medial malleolus slightly smaller. Small open area on the right. We used silver nitrate and silver alginate as of 2 weeks ago. We continue with the wound and compression. These got a lot better when she was off her feet 1/17; right medial malleolus wound is smaller. The left may be slightly  smaller. Both surfaces look somewhat better. 1/24; both wounds are slightly smaller. Using silver alginate under Unna boots 1/31; both wounds appear smaller in fact the area on the right medial is just about closed. Surface eschar. We have been using silver alginate under Unna boots. The patient is less active now spends let much less time on her feet and I think this is contributed to the general improvement in the wound condition 2/7; both wounds appear smaller. I was hopeful the right medial would be closed however there there is still the same small open area. Slight amount of surface eschar on the left the dimensions are smaller there is eschar but the wound edges appear to be free. We have been using silver alginate under Unna boot's 2/14; both wounds once again measure smaller. Circumferential eschar on the left medial. We have been using silver alginate under Unna boots with gradual improvement 2/21; the area on the right medial malleolus has healed. The area on the left is smaller. We have been using silver alginate and Unna boots. We can discharge wrapping the right leg she has 20/30 stockings at home she will need to protect the scar tissue in this area 2/28; the area on the right medial malleolus remains closed the patient has a compression stocking. The area on the left is smaller. We have been using silver alginate and Unna boots. 3/6 the area on the right medial ankle remains closed. Good edema control noted she is using her own compression stocking. The area on the left medial ankle is smaller. We have been managing this with silver alginate and Unna boots which we will continue today. 3/13; the area on the right medial ankle remains closed and I'm declaring it healed today. When necessary the left is about the same still a healthy-looking surface but no major change and wound area. No evidence of infection and using silver alginate under unna and generally making considerable  improvement 3/27 the area on the right medial ankle remains closed the area on the left is about the same as last week. Certainly not any worse we have been using silver alginate under an Unna boot 4/3; the area on the right medial ankle remains closed per the patient. We did not look at this wound. The wound on the left medial ankle is about the same surface looks healthy we have been using silver alginate under an Unna boot 4/10; area on the right medial ankle remains closed per the  patient. We did not look at this wound. The wound on the left medial ankle is slightly larger. The patient complains that the [facility] caused burning pain all week. She also told us that she was a lot more active this week. Changed her back to silver alginate 4/17; right medial ankle still closed per the patient. Left medial ankle is slightly larger. Using silver alginate. She did not tolerate Hydrofera Blue on this area 4/24; right medial ankle remains closed we have not look at this. The left medial ankle continues to get larger today by about a centimeter. We have been using silver alginate under Unna boots. She complains about 4 layer compression as an alternative. She has been up on her feet working on her garden 5/8; right medial ankle remains closed we did not look at this. The left medial ankle has increased in size about 100%. We have been using silver alginate under Unna boots. She noted increased pain this week and was not surprised that the wound is deteriorated 5/15; no major change in SA however much less erythema ( one week of doxy ocellulitis). 5/22-64 year old female returns at 1 week to the clinic for left medial ankle wound for which we have been using silver alginate under 3 layer compression She was placed on DOXY at last visit - the wound is wider at this visit. She is in 3 layer compression 5/29; change to [facility] last week. I had given her empiric doxycycline 2 weeks ago for a week.  She is in 3 layer compression. She complains of a lot of pain and drainage on presentation today. 6/5; using Hydrofera Blue. I gave her doxycycline recently empirically for erythema and pain around the wound. Believe her cultures showed enterococcus which not would not have been well covered by doxycycline nevertheless the wound looks better and I don't feel specifically that the enterococcus needs to be covered. She has a new what looks like a wrap injury on her lateral left ankle. 6/12; she is using Hydrofera Blue. She has a new area on the left anterior lower tibial area. This was a wrap injury last week. 6/19; the patient is using Hydrofera Blue. She arrived with marked inflammation and erythema around the wound and tenderness. 12/01/18 on evaluation today patient appears to be doing a little bit better based on what I'm hearing from the standpoint of lassos evaluation to this as far as the overall appearance of the wound is concerned. Then sometime substandard she typically sees Dr. Dellia Nims. Nonetheless overall very pleased with the progress that she's made up to this point. No fevers, chills, nausea, or vomiting noted at this time. 7/10; some improvement in the surface area. Aggressively debrided last week apparently. I went ahead with the debridement today although the patient does not tolerate this very well. We have been using Iodoflex. Still a fair amount of drainage 7/17; slightly smaller. Using Iodoflex. 7/24; no change from last week in terms of surface area. We have been using Iodoflex. Surface looks and continues to look somewhat better 7/31; surface area slightly smaller better looking surface. We have been using Iodoflex. This is under Unna boot compression 8/7-Patient presents at 1 week with Unna boot and Iodoflex, wound appears better 8/14-Patient presents at 1 week with Iodoflex, we use the Unna boot, wound appears to be stable better.Patient is getting Botox treatment for the  inversion of the foot for tendon release, Next week 8/21; we are using Iodoflex. Unna boot. The wound is stable  in terms of surface area. Under illumination there is some areas of the wound that appear to be either epithelialized or perhaps this is adherent slough at this point I was not really clear. It did not wipe off and I was reluctant to debride this today. 8/28; we are using Iodoflex in an Unna boot. Seems to be making good improvement. 9/4; using Iodoflex and wound is slightly smaller. 9/18; we are using Iodoflex with topical silver nitrate when she is here. The wound continues to be smaller 10/2; patient missed her appointment last week due to GI issues. She left and Iodoflex based dressing on for 2 weeks. Wound is about the same size about the size of a dime on the left medial lower 10/9 we have been using Iodoflex on the medial left ankle wound. She has a new superficial probable wrap injury on the dorsal left ankle 10/16; we have been using Hydrofera Blue since last week. This is on the left medial ankle 10/23; we have been using Hydrofera Blue since 2 weeks ago. This is on the left medial ankle. Dimensions are better 11/6; using Hydrofera Blue. I think the wound is smaller but still not closed. Left medial ankle 11/13; we have been using Hydrofera Blue. Wound is certainly no smaller this week. Also the surface not as good. This is the remanent of a very large area on her left medial ankle. 11/20; using Sorbact since last week. Wound was about the same in terms of size although I was disappointed about the surface debris 12/11; 3-week follow-up. Patient was on vacation. Wound is measuring slightly larger we have been using Sorbact. 12/18; wound is about the same size however surface looks better last week after debridement. We have been using Sorbact under compression 1/15 wound is probably twice the size of last time increased in length nonviable surface. We have been using Sorbact.  She was running a mild fever and missed her appointment last week 1/22; the wound is come down in size but under illumination still a very adherent debris we have been Hydrofera Blue that I changed her to last week 1/29; dimensions down slightly. We have been using Hydrofera Blue 2/19 dimensions are the same however there is rims of epithelialization under illumination. Therefore more the surface area may be epithelialized 2/26; the patient's wound actually measures smaller. The wound looks healthy. We have been using Hydrofera Blue. I had some thoughts about running Apligraf then I still may do that however this looks so much better this week we will delay that for now 3/5; the wound is small but about the same as last week. We have been using Hydrofera Blue. No debridement is required today. 3/19; the wound is about the size of a dime. Healthy looking wound even under illumination. We have been using Hydrofera Blue. No mechanical debridement is necessary 3/26; not much change from last week although still looks very healthy. We have been using Hydrofera Blue under Unna boots Patient was offered an ankle fusion by podiatry but not until the wound heals with a proceed with this. 4/9; the patient comes in today with her original wound on the medial ankle looking satisfactory however she has some uncontrolled swelling in the middle part of her leg with 2 new open areas superiorly just lateral to the tibia. I think this was probably a wrap issue. She said she felt uncomfortable during the week but did not call in. We have been using Hydrofera Blue 4/16; the wound on  the medial ankle is about the same. She has innumerable small areas superior to this across her mid tibia. I think this is probably folliculitis. She is also been working in the yard doing a lot of sweating 4/30; the patient issue on the upper areas across her mid tibia of all healed. I think this was excessive yard work if I remember. Her  wound on the medial ankle is smaller. Some debris on this we have been using Hydrofera Blue under Unna boots 5/7; mid tibia. She has been using Hydrofera Blue under an Unna wrap. She is apparently going for her ankle surgery on June 3 10/28/19-Patient returns to clinic with the ankle wound, we are using Hydrofera Blue under Unna wrap, surgery is scheduled for her left foot for June 3 so she will be back for nurse visit next week READMISSION 01/17/2020 Mrs. Walthour is a 64 year old woman we have had in this clinic for a long period of time with severe venous hypertension and refractory wounds on her medial lower legs and ankles bilaterally. 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 When she was off her legs for a prolonged period example when she fell and suffered a compression fracture things would heal up quite nicely. She is now retired and we managed to heal up the right medial leg wound. The left one was very tiny last time I saw this although still refractory. 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 It got to the point where this was interfering with her walking and she elected to proceed with a ankle arthrodesis to straighten her her ankle and leave her with a functional outcome for mobilization. The patient was referred to Dr. Doren Custard and really this took some time to arrange. Dr. Doren Custard saw her on 12/07/2019. Once again he verified that she had no arterial issues. She had previously had an angiogram several years ago. Follow-up ABIs on the left showed an ABI of 1.12 with triphasic waveforms and a TBI of 0.92. She is felt to have chronic deep venous insufficiency but I do not think it was felt that anything could be done from about this from an ablation point of view. At the time Dr.  Doren Custard saw this patient the wounds actually look closed via the pictures in his clinic. The patient finally underwent her surgery on 12/15/2019. This went reasonably well and there was a good anatomic outcome. She developed a small distal wound dehiscence on the lateral part of the surgical wound. However more problematically she is developed recurrence of the wound on the medial left ankle. There are actually 2 wounds here one in the distal lower leg and 1 pretty much at the level of the medial malleolus. It is a more distal area that is more problematic. She has been using Hydrofera Blue which started on Friday before this she was simply Ace wrapping. There was a culture done that showed Pseudomonas and she is on ciprofloxacin. A recent CNS on 8/11 was negative. The patient reports some pain but I generally think this is improving. She is using a cam boot completely nonweightbearing using a walker for pivot transfers and a wheelchair 8/24; not much improvement unfortunately she has a surgical wound on the lateral part in the venous insufficiency wound medially. The bottom  part of the medial insufficiency wound is still necrotic there is exposed tendon here. We have been using Hydrofera Blue under compression. Her edema control is however better 8/31; patient in for follow-up of his surgical wound on the lateral part of her left leg and chronic venous insufficiency ulcers medially. We put her back in compression last week. She comes in today with a complaint of 3 or 4 days worth of increasing pain. She felt her cam walker was rubbing on the area on the back of her heel. However there is intense erythema seems more likely she has cellulitis. She had 2 cultures done when she was seeing podiatry in the postop. One of them in late July showed Pseudomonas and she received a course of ciprofloxacin the other was negative on 8/11 she is allergic to penicillin with anaphylactoid complaints of hives oral  swelling via information in epic 9/9; when I saw this patient last week she had intense anterior erythema around her wound on the right lateral heel and ankle and also into the right medial heel. Some of this was no doubt drainage and her walker boot however I was convinced she had cellulitis. I gave her Levaquin and Bactrim she is finishing up on this now. She is following up with Dr. [name] he saw her yesterday. He is taken her out of the walking boot of course she is still nonweightbearing. Her x-ray was negative for any worrisome features such as soft tissue air etc. Things are a lot better this week. She has home health. We have been using Hydrofera Blue under an [employer] which she put back on yesterday. I did not wrap her last week Objective Constitutional Sitting or standing Blood Pressure is within target range for patient.. Pulse regular and within target range for patient.Marland Kitchen Respirations regular, non-labored and within target range.. Temperature is normal and within the target range for the patient.Marland Kitchen Appears in no distress. Vitals Time Taken: 10:35 AM, Height: 68 in, Temperature: 98.4 F, Pulse: 61 bpm, Respiratory Rate: 18 breaths/min, Blood Pressure: 107/62 mmHg. General Notes: Wound exam; left medial lower leg ankle looks quite healthy except for an area with more depth inferiorly. Here there is some debris I used a #3 curette on this. Were going to have to get some of this removed so we can get some epithelialization in the area. ooOn the lateral side everything is closed ooEverything looks a lot better this week there is no erythema. Integumentary (Hair, Skin) Wound #14 status is Healed - Epithelialized. Original cause of wound was Surgical Injury. The wound is located on the Left,Lateral Ankle. The wound measures 0cm length x 0cm width x 0cm depth; 0cm^2 area and 0cm^3 volume. There is Fat Layer (Subcutaneous Tissue) exposed. There is no tunneling or undermining noted. There is a  small amount of serosanguineous drainage noted. The wound margin is distinct with the outline attached to the wound base. There is no granulation within the wound bed. There is a large (67-100%) amount of necrotic tissue within the wound bed including Adherent Slough. Wound #15 status is Open. Original cause of wound was Gradually Appeared. The wound is located on the Left,Medial Malleolus. The wound measures 7.3cm length x 4.8cm width x 0.9cm depth; 27.52cm^2 area and 24.768cm^3 volume. There is tendon and Fat Layer (Subcutaneous Tissue) exposed. There is no tunneling or undermining noted. There is a medium amount of serosanguineous drainage noted. The wound margin is flat and intact. There is large (67-100%) red granulation within the  wound bed. There is a small (1-33%) amount of necrotic tissue within the wound bed including Adherent Slough. Assessment Active Problems ICD-10 Chronic venous hypertension (idiopathic) with ulcer and inflammation of left lower extremity Non-pressure chronic ulcer of other part of left lower leg with other specified severity Cellulitis of left lower limb Non-pressure chronic ulcer of left ankle with other specified severity Disruption of external operation (surgical) wound, not elsewhere classified, subsequent encounter Procedures Wound #15 Pre-procedure diagnosis of Wound #15 is a Venous Leg Ulcer located on the Left,Medial Malleolus .Severity of Tissue Pre Debridement is: Fat layer exposed. There was a Excisional Skin/Subcutaneous Tissue Debridement with a total area of 4 sq cm performed by [name]., MD. With the following instrument(s): Curette to remove Viable and Non-Viable tissue/material. Material removed includes Eschar, Subcutaneous Tissue, Slough, Skin: Dermis, and Fibrin/Exudate after achieving pain control using Lidocaine 4% Topical Solution. A time out was conducted at 11:28, prior to the start of the procedure. A Minimum amount of bleeding  was controlled with Pressure. The procedure was tolerated well with a pain level of 0 throughout and a pain level of 3 following the procedure. Post Debridement Measurements: 7.3cm length x 4.8cm width x 0.9cm depth; 24.768cm^3 volume. Character of Wound/Ulcer Post Debridement requires further debridement. Severity of Tissue Post Debridement is: Fat layer exposed. Post procedure Diagnosis Wound #15: Same as Pre-Procedure Pre-procedure diagnosis of Wound #15 is a Venous Leg Ulcer located on the Left,Medial Malleolus . There was a Haematologist Compression Therapy Procedure by Baruch Gouty, RN. Post procedure Diagnosis Wound #15: Same as Pre-Procedure Plan Follow-up Appointments: Return Appointment in 1 week. Dressing Change Frequency: Other: - home health to change dressing twice a week. Skin Barriers/Peri-Wound Care: Moisturizing lotion Wound Cleansing: May shower with protection. - use cast protector Primary Wound Dressing: Wound #15 Left,Medial Malleolus: Silver Collagen - moisten with hydrogel. Secondary Dressing: Wound #15 Left,Medial Malleolus: Dry Gauze ABD pad Edema Control: Unna Boot to Left Lower Extremity Avoid standing for long periods of time Elevate legs to the level of the heart or above for 30 minutes daily and/or when sitting, a frequency of: - throughout the day Off-Loading: Other: - ensure no pressure to wound areas. Home Health: [place] skilled nursing for wound care. - [facility] to change twice a week. #1 I change the primary dressing to silver collagen I would like to see if we can get some of the divot to fill-in on the left medial. 2. Everything on the left is closed 3. Between removing her walking boot and the antibiotics things are a lot better this week. I think we got her out of the hospitalization 4. The Bactrim and the Levaquin can finish I told her there is no additional antibiotic Electronic Signature(s) Signed: 02/10/2020  1:37:47 PM By: Linton Ham MD Entered By: Linton Ham on 02/09/2020 12:59:32 -------------------------------------------------------------------------------- SuperBill Details Patient Name: Date of Service: [name], [name] 02/09/2020 Medical Record Number: [id number] Patient Account Number: [id number] Date of Birth/Sex: Treating RN: July 06, 1955 (64 y.o. [name], [name] Primary Care Provider: [name] Other Clinician: Referring Provider: Treating Provider/Extender: [name] in Treatment: 3 Diagnosis Coding ICD-10 Codes Code Description [phone] Chronic venous hypertension (idiopathic) with ulcer and inflammation of left lower extremity L97.828 Non-pressure chronic ulcer of other part of left lower leg with other specified severity L03.116 Cellulitis of left lower limb L97.328 Non-pressure chronic ulcer of left ankle with other specified severity T81.31XD Disruption of external  operation (surgical) wound, not elsewhere classified, subsequent encounter Facility Procedures CPT4 Code: [id number] Description: 84720 - DEB SUBQ TISSUE 20 SQ CM/< ICD-10 Diagnosis Description L97.828 Non-pressure chronic ulcer of other part of left lower leg with other specified Modifier: severity Quantity: 1 Physician Procedures : CPT4 Code Description Modifier [id number] - WC PHYS SUBQ TISS 20 SQ CM ICD-10 Diagnosis Description L97.828 Non-pressure chronic ulcer of other part of left lower leg with other specified severity Quantity: 1 Electronic Signature(s) Signed: 02/10/2020 1:37:47 PM By: Linton Ham MD Entered By: Linton Ham on 02/09/2020 12:59:46

## 2020-02-10 NOTE — Progress Notes (Signed)
[name], [name] ([id number]) Visit Report for 02/09/2020 Arrival Information Details Patient Name: Date of Service: [facility], [name] 02/09/2020 10:00 A M Medical Record Number: [id number] Patient Account Number: [id number] Date of Birth/Sex: Treating RN: [birth year] (64 y.o. [name], [name] Primary Care [name]: [name] Other Clinician: Referring [name]: Treating [name]/Extender: [name] in Treatment: 3 Visit Information History Since Last Visit Added or deleted any medications: No Patient Arrived: Wheel Chair Any new allergies or adverse reactions: No Arrival Time: 10:34 Had a fall or experienced change in No Accompanied By: self activities of daily living that may affect Transfer Assistance: None risk of falls: Patient Identification Verified: Yes Signs or symptoms of abuse/neglect since last visito No Secondary Verification Process Completed: Yes Hospitalized since last visit: No Patient Has Alerts: Yes Implantable device outside of the clinic excluding No Patient Alerts: L ABI =1.12, TBI = .92 cellular tissue based products placed in the center R ABI= 1.02, TBI= .58 since last visit: Has Dressing in Place as Prescribed: Yes Pain Present Now: Yes Electronic Signature(s) Signed: 02/09/2020 1:36:06 PM By: [name] Entered By: [name] on 02/09/2020 10:35:07 -------------------------------------------------------------------------------- Compression Therapy Details Patient Name: Date of Service: [name], [name] 02/09/2020 10:00 A M Medical Record Number: [id number] Patient Account Number: [id number] Date of Birth/Sex: Treating RN: [birth year] (64 y.o. [name] Primary Care Kabrea Seeney: [name] Other Clinician: Referring Verbon Giangregorio: Treating [name]/Extender: [name] in Treatment: 3 Compression Therapy Performed for Wound Assessment: Wound #15 Left,Medial Malleolus Performed  By: Clinician [name] Gouty, RN Compression Type: Rolena Infante Post Procedure Diagnosis Same as Pre-procedure Electronic Signature(s) Signed: 02/09/2020 5:40:39 PM By: [name] Entered By: [name] on 02/09/2020 11:36:09 -------------------------------------------------------------------------------- Encounter Discharge Information Details Patient Name: Date of Service: [name], [name] 02/09/2020 10:00 [name] Record Number: [id number] Patient Account Number: [id number] Date of Birth/Sex: Treating RN: 1955-12-12 (64 y.o. [name] Primary Care Jareth Pardee: [name] Other Clinician: Referring Keylan Costabile: Treating Adonte Vanriper/Extender: [name] in Treatment: 3 Encounter Discharge Information Items Post Procedure Vitals Discharge Condition: Stable Temperature (F): 98.4 Ambulatory Status: Wheelchair Pulse (bpm): 61 Discharge Destination: Home Respiratory Rate (breaths/min): 18 Transportation: Other Blood Pressure (mmHg): 107/62 Accompanied By: alone Schedule Follow-up Appointment: Yes Clinical Summary of Care: Patient Declined Electronic Signature(s) Signed: 02/09/2020 5:37:15 PM By: [name] Hurst RN, BSN Entered By: [name] on 02/09/2020 12:08:58 -------------------------------------------------------------------------------- Lower Extremity Assessment Details Patient Name: Date of Service: [name], [name] 02/09/2020 10:00 A M Medical Record Number: [id number] Patient Account Number: [id number] Date of Birth/Sex: Treating RN: [birth year] (64 y.o. [name] Primary Care Charl Wellen: [name] Other Clinician: Referring Edmar Blankenburg: Treating Hannan Tetzlaff/Extender: [name] in Treatment: 3 Edema Assessment Assessed: [Left: No] [Right: No] Edema: [Left: Ye] [Right: s] Calf Left: Right: Point of Measurement: cm From Medial Instep 27.5 cm cm Ankle Left: Right: Point of Measurement: cm  From Medial Instep 22 cm cm Vascular Assessment Pulses: Dorsalis Pedis Palpable: [Left:Yes] Electronic Signature(s) Signed: 02/09/2020 5:37:15 PM By: [name] Hurst RN, BSN Entered By: [name] on 02/09/2020 10:59:19 -------------------------------------------------------------------------------- Multi Wound Chart Details Patient Name: Date of Service: [name], [name] 02/09/2020 10:00 A M Medical Record Number: [id number] Patient Account Number: [id number] Date of Birth/Sex: Treating RN: [birth year] (64 y.o. [name] Primary Care Anzal Bartnick: [name] Other Clinician: Referring Esthela Brandner: Treating Myrel Rappleye/Extender: [name] in Treatment:  3 Vital Signs Height(in): 68 Pulse(bpm): 61 Weight(lbs): Blood Pressure(mmHg): 107/62 Body Mass Index(BMI): Temperature(F): 98.4 Respiratory Rate(breaths/min): 18 Photos: [14:No Photos Left, Lateral Ankle] [15:No Photos Left, Medial Malleolus] [N/A:N/A N/A] Wound Location: [14:Surgical Injury] [15:Gradually Appeared] [N/A:N/A] Wounding Event: [14:Open Surgical Wound] [15:Venous Leg Ulcer] [N/A:N/A] Primary Etiology: [14:Peripheral Venous Disease,] [15:Peripheral Venous Disease,] [N/A:N/A] Comorbid History: [14:Osteoarthritis, Confinement Anxiety Osteoarthritis, Confinement Anxiety 12/15/2019] [15:12/30/2019] [N/A:N/A] Date Acquired: [14:3] [15:3] [N/A:N/A] Weeks of Treatment: [14:Healed - Epithelialized] [15:Open] [N/A:N/A] Wound Status: [14:0x0x0] [15:7.3x4.8x0.9] [N/A:N/A] Measurements L x W x D (cm) [14:0] [15:27.52] [N/A:N/A] A (cm) : rea [14:0] [19:62.229] [N/A:N/A] Volume (cm) : [14:100.00%] [15:-136.00%] [N/A:N/A] % Reduction in A [14:rea: 100.00%] [15:-165.40%] [N/A:N/A] % Reduction in Volume: [14:Full Thickness Without Exposed] [15:Full Thickness With Exposed Support N/A] Classification: [14:Support Structures Small] [15:Structures Medium] [N/A:N/A] Exudate A mount:  [14:Serosanguineous] [15:Serosanguineous] [N/A:N/A] Exudate Type: [14:red, brown] [15:red, brown] [N/A:N/A] Exudate Color: [14:Distinct, outline attached] [15:Flat and Intact] [N/A:N/A] Wound Margin: [14:None Present (0%)] [15:Large (67-100%)] [N/A:N/A] Granulation A mount: [14:N/A] [15:Red] [N/A:N/A] Granulation Quality: [14:Large (67-100%)] [15:Small (1-33%)] [N/A:N/A] Necrotic A mount: [14:Fat Layer (Subcutaneous Tissue): Yes Fat Layer (Subcutaneous Tissue): Yes N/A] Exposed Structures: [14:Fascia: No Tendon: No Muscle: No Joint: No Bone: No Large (67-100%)] [15:Tendon: Yes Fascia: No Muscle: No Joint: No Bone: No Small (1-33%)] [N/A:N/A] Epithelialization: [14:N/A] [15:Debridement - Excisional] [N/A:N/A] Debridement: Pre-procedure Verification/Time Out N/A [15:11:28] [N/A:N/A] Taken: [14:N/A] [15:Lidocaine 4% Topical Solution] [N/A:N/A] Pain Control: [14:N/A] [15:Necrotic/Eschar, Subcutaneous,] [N/A:N/A] Tissue Debrided: [14:N/A] [15:Slough Skin/Subcutaneous Tissue] [N/A:N/A] Level: [14:N/A] [15:4] [N/A:N/A] Debridement A (sq cm): [14:rea N/A] [15:Curette] [N/A:N/A] Instrument: [14:N/A] [15:Minimum] [N/A:N/A] Bleeding: [14:N/A] [15:Pressure] [N/A:N/A] Hemostasis Achieved: [14:N/A] [15:0] [N/A:N/A] Procedural Pain: [14:N/A] [15:3] [N/A:N/A] Post Procedural Pain: [14:N/A] [15:Procedure was tolerated well] [N/A:N/A] Debridement Treatment Response: [14:N/A] [15:7.3x4.8x0.9] [N/A:N/A] Post Debridement Measurements L x W x D (cm) [14:N/A] [15:24.768] [N/A:N/A] Post Debridement Volume: (cm) [14:N/A] [15:Compression Therapy] [N/A:N/A] Procedures Performed: [15:Debridement] Treatment Notes Wound #15 (Left, Medial Malleolus) 1. Cleanse With Soap and water 2. Periwound Care Moisturizing lotion 3. Primary Dressing Applied Collegen AG 4. Secondary Dressing ABD Pad Dry Gauze 6. Support Layer Best boy) Signed: 02/09/2020 5:40:39 PM By: [name] Signed: 02/10/2020 1:37:47 PM By: Linton Ham MD Entered By: Linton Ham on 02/09/2020 12:51:58 -------------------------------------------------------------------------------- Multi-Disciplinary Care Plan Details Patient Name: Date of Service: [name], [name] 02/09/2020 10:00 A M Medical Record Number: [id number] Patient Account Number: [id number] Date of Birth/Sex: Treating RN: 1955-08-05 (64 y.o. [name] Primary Care Zena Vitelli: [name] Other Clinician: Referring Mykelti Goldenstein: Treating Yasheka Fossett/Extender: [name] in Treatment: 3 Active Inactive Abuse / Safety / Falls / Self Care Management Nursing Diagnoses: Potential for falls Potential for injury related to falls Goals: Patient will not experience any injury related to falls Date Initiated: 01/17/2020 Target Resolution Date: 02/17/2020 Goal Status: Active Patient/caregiver will verbalize/demonstrate measures taken to prevent injury and/or falls Date Initiated: 01/17/2020 Target Resolution Date: 02/17/2020 Goal Status: Active Interventions: Assess Activities of Daily Living upon admission and as needed Assess fall risk on admission and as needed Assess: immobility, friction, shearing, incontinence upon admission and as needed Assess impairment of mobility on admission and as needed per policy Assess personal safety and home safety (as indicated) on admission and as needed Assess self care needs on admission and as needed Provide education on fall prevention Provide education on personal and home safety Notes: Wound/Skin Impairment Nursing Diagnoses: Impaired tissue integrity Knowledge deficit related to ulceration/compromised skin integrity Goals: Patient/caregiver will verbalize understanding  of skin care regimen Date Initiated: 01/17/2020 Target Resolution Date: 02/17/2020 Goal Status: Active Ulcer/skin breakdown will have a volume reduction of 30% by week 4 Date  Initiated: 01/17/2020 Target Resolution Date: 02/17/2020 Goal Status: Active Interventions: Assess patient/caregiver ability to obtain necessary supplies Assess patient/caregiver ability to perform ulcer/skin care regimen upon admission and as needed Assess ulceration(s) every visit Provide education on ulcer and skin care Notes: Electronic Signature(s) Signed: 02/09/2020 5:40:39 PM By: [name] Entered By: [name] on 02/09/2020 10:14:20 -------------------------------------------------------------------------------- Pain Assessment Details Patient Name: Date of Service: [name], [name] 02/09/2020 10:00 A M Medical Record Number: [id number] Patient Account Number: [id number] Date of Birth/Sex: Treating RN: 03/09/1956 (64 y.o. [name] Primary Care Emeree Mahler: [name] Other Clinician: Referring Janeshia Ciliberto: Treating Jackquelyn Sundberg/Extender: [name] in Treatment: 3 Active Problems Location of Pain Severity and Description of Pain Patient Has Paino Yes Site Locations Rate the pain. Current Pain Level: 5 Pain Management and Medication Current Pain Management: Electronic Signature(s) Signed: 02/09/2020 1:36:06 PM By: [name] Signed: 02/09/2020 5:40:39 PM By: [name] Entered By: [name] on 02/09/2020 10:35:42 -------------------------------------------------------------------------------- Patient/Caregiver Education Details Patient Name: Date of Service: [name], [name] 9/9/2021andnbsp10:00 A M Medical Record Number: [id number] Patient Account Number: [id number] Date of Birth/Gender: Treating RN: 04-22-1956 (64 y.o. [name] Primary Care Physician: [name] Other Clinician: Referring Physician: Treating Physician/Extender: [name] in Treatment: 3 Education Assessment Education Provided To: Patient Education Topics Provided Safety: Handouts: Personal  Safety Methods: Explain/Verbal Responses: Reinforcements needed Electronic Signature(s) Signed: 02/09/2020 5:40:39 PM By: [name] Entered By: [name] on 02/09/2020 10:14:41 -------------------------------------------------------------------------------- Wound Assessment Details Patient Name: Date of Service: [name], [name] 02/09/2020 10:00 [place] Record Number: [id number] Patient Account Number: [id number] Date of Birth/Sex: Treating RN: [birth year] (64 y.o. [name], [name] Primary Care Hoy Fallert: [name] Other Clinician: Referring Ireanna Finlayson: Treating Conall Vangorder/Extender: [name] in Treatment: 3 Wound Status Wound Number: 14 Primary Open Surgical Wound Etiology: Wound Location: Left, Lateral Ankle Wound Status: Healed - Epithelialized Wounding Event: Surgical Injury Comorbid Peripheral Venous Disease, Osteoarthritis, Confinement Date Acquired: 12/15/2019 History: Anxiety Weeks Of Treatment: 3 Clustered Wound: No Photos Photo Uploaded By: Mikeal Hawthorne on 02/10/2020 11:25:20 Wound Measurements Length: (cm) Width: (cm) Depth: (cm) Area: (cm) Volume: (cm) 0 % Reduction in Area: 100% 0 % Reduction in Volume: 100% 0 Epithelialization: Large (67-100%) 0 Tunneling: No 0 Undermining: No Wound Description Classification: Full Thickness Without Exposed Support Structures Wound Margin: Distinct, outline attached Exudate Amount: Small Exudate Type: Serosanguineous Exudate Color: red, brown Foul [name] After Cleansing: No Slough/Fibrino Yes Wound Bed Granulation Amount: None Present (0%) Exposed Structure Necrotic Amount: Large (67-100%) Fascia Exposed: No Necrotic Quality: Adherent Slough Fat Layer (Subcutaneous Tissue) Exposed: Yes Tendon Exposed: No Muscle Exposed: No Joint Exposed: No Bone Exposed: No Electronic Signature(s) Signed: 02/09/2020 5:40:39 PM By: [name] Entered By: [name] on 02/09/2020  11:35:43 -------------------------------------------------------------------------------- Wound Assessment Details Patient Name: Date of Service: [name], [name] 02/09/2020 10:00 A M Medical Record Number: [id number] Patient Account Number: [id number] Date of Birth/Sex: Treating RN: 1955/09/25 (64 y.o. [name] Primary Care Jahmal Dunavant: [name] Other Clinician: Referring Annaly Skop: Treating [name] Hanlon/Extender: [name] in Treatment: 3 Wound Status Wound Number: 15 Primary Venous Leg Ulcer Etiology: Wound Location: Left, Medial Malleolus Wound Status: Open Wounding Event: Gradually Appeared Comorbid Peripheral Venous Disease, Osteoarthritis, Confinement  Date Acquired: 12/30/2019 History: Anxiety Weeks Of Treatment: 3 Clustered Wound: No Photos Photo Uploaded By: Mikeal Hawthorne on 02/10/2020 11:25:38 Wound Measurements Length: (cm) 7.3 Width: (cm) 4.8 Depth: (cm) 0.9 Area: (cm) 27.52 Volume: (cm) 24.768 % Reduction in Area: -136% % Reduction in Volume: -165.4% Epithelialization: Small (1-33%) Tunneling: No Undermining: No Wound Description Classification: Full Thickness With Exposed Support Structures Wound Margin: Flat and Intact Exudate Amount: Medium Exudate Type: Serosanguineous Exudate Color: red, brown Foul [name] After Cleansing: No Slough/Fibrino Yes Wound Bed Granulation Amount: Large (67-100%) Exposed Structure Granulation Quality: Red Fascia Exposed: No Necrotic Amount: Small (1-33%) Fat Layer (Subcutaneous Tissue) Exposed: Yes Necrotic Quality: Adherent Slough Tendon Exposed: Yes Muscle Exposed: No Joint Exposed: No Bone Exposed: No Treatment Notes Wound #15 (Left, Medial Malleolus) 1. Cleanse With Soap and water 2. Periwound Care Moisturizing lotion 3. Primary Dressing Applied Collegen AG 4. Secondary Dressing ABD Pad Dry Gauze 6. Support Layer Best boy) Signed:  02/09/2020 5:37:15 PM By: [name] Hurst RN, BSN Signed: 02/09/2020 5:40:39 PM By: [name] Entered By: [name] on 02/09/2020 11:00:21 -------------------------------------------------------------------------------- [place] Details Patient Name: Date of Service: [name], [name] 02/09/2020 10:00 A M Medical Record Number: [id number] Patient Account Number: [id number] Date of Birth/Sex: Treating RN: 1955-06-11 (64 y.o. [name], Meta.Reding Primary Care Burel Kahre: [name] Other Clinician: Referring Myrel Rappleye: Treating Zanyah Lentsch/Extender: [name] in Treatment: 3 Vital Signs Time Taken: 10:35 Temperature (F): 98.4 Height (in): 68 Pulse (bpm): 61 Respiratory Rate (breaths/min): 18 Blood Pressure (mmHg): 107/62 Reference Range: 80 - 120 mg / dl Electronic Signature(s) Signed: 02/09/2020 1:36:06 PM By: [name] Entered By: [name] on 02/09/2020 10:35:28

## 2020-02-17 ENCOUNTER — Other Ambulatory Visit: Payer: Self-pay

## 2020-02-17 ENCOUNTER — Encounter (HOSPITAL_BASED_OUTPATIENT_CLINIC_OR_DEPARTMENT_OTHER): Payer: Medicare Other | Admitting: Internal Medicine

## 2020-02-17 DIAGNOSIS — L97328 Non-pressure chronic ulcer of left ankle with other specified severity: Secondary | ICD-10-CM | POA: Diagnosis not present

## 2020-02-20 NOTE — Progress Notes (Signed)
FYNLEY, CHRYSTAL ([id number]) Visit Report for 02/17/2020 Debridement Details Patient Name: Date of Service: [facility] [name], [name] 02/17/2020 10:00 A M Medical Record Number: [id number] Patient Account Number: [id number] Date of Birth/Sex: Treating RN: Nov 25, 1955 (64 y.o. [name] Primary Care Provider: [name] Other Clinician: Referring Provider: Treating Provider/Extender: [name] in Treatment: 4 Debridement Performed for Assessment: Wound #15 Left,Medial Malleolus Performed By: Physician [name]., MD Debridement Type: Debridement Severity of Tissue Pre Debridement: Fat layer exposed Level of Consciousness (Pre-procedure): Awake and Alert Pre-procedure Verification/Time Out Yes - 10:44 Taken: Start Time: 10:44 Pain Control: Other : benzocaine, 20% T Area Debrided (L x W): otal 6.5 (cm) x 3.1 (cm) = 20.15 (cm) Tissue and other material debrided: Viable, Non-Viable, Subcutaneous Level: Skin/Subcutaneous Tissue Debridement Description: Excisional Instrument: Curette Bleeding: Minimum Hemostasis Achieved: Pressure End Time: 10:45 Procedural Pain: 2 Post Procedural Pain: 0 Response to Treatment: Procedure was tolerated well Level of Consciousness (Post- Awake and Alert procedure): Post Debridement Measurements of Total Wound Length: (cm) 6.5 Width: (cm) 3.1 Depth: (cm) 1.1 Volume: (cm) 17.408 Character of Wound/Ulcer Post Debridement: Improved Severity of Tissue Post Debridement: Fat layer exposed Post Procedure Diagnosis Same as Pre-procedure Electronic Signature(s) Signed: 02/20/2020 8:08:43 AM By: Linton Ham MD Signed: 02/20/2020 1:33:30 PM By: Kela Millin Entered By: Linton Ham on 02/17/2020 11:02:35 -------------------------------------------------------------------------------- HPI Details Patient Name: Date of Service: [name], [name] 02/17/2020 10:00 A M Medical Record Number:  [id number] Patient Account Number: [id number] Date of Birth/Sex: Treating RN: July 19, 1955 (64 y.o. [name] Primary Care Provider: [name] Other Clinician: Referring Provider: Treating Provider/Extender: [name] in Treatment: 4 History of Present Illness HPI Description: the remaining wound is over the left medial ankle. Similar wound over the right medial ankle healed largely with use of Apligraf. Most recently we have been using Hydrofera Blue over this wound with considerable improvement. The patient has been extensively worked up in the past for her venous insufficiency and she is not a candidate for antireflux surgery although I have none of the details available currently. 08/24/14; considerable improvement today. About 50% of this wound areas now epithelialized. The base of the wound appears to be healthier granulation.as opposed to last week when she had deteriorated a considerable improvement 08/17/14; unfortunately the wound has regressed somewhat. The areas of epithelialization from the superior aspect are not nearly as healthy as they were last week. The patient thinks her Hydrofera Blue slipped. 09/07/14; unfortunately the area has markedly regressed in the 2 weeks since I've seen this. There is an odor surrounding erythema. The healthy granulation tissue that we had at the base of the wound now is a dusky color. The nurse reports green drainage 09/14/14; the area looks somewhat better than last week. There is less erythema and less drainage. The culture I did did not show any growth. Nevertheless I think it is better to continue the Cipro and doxycycline for a further week. The remaining wound area was debridement. 09/21/14. Wound did not require debridement last week. Still less erythema and less drainage. She can complete her antibiotics. The areas of epithelialization in the superior aspect of the wound do not look as healthy as they did  some weeks ago 10/05/14 continued improvement in the condition of this wound. There is advancing epithelialization. Less aggressive debridement required 10/19/14 continued improvement in the condition and volume of this wound. Less aggressive debridement to the inferior part of  this to remove surface slough and fibrinous eschar 11/02/14 no debridement is required. The surface granulation appears healthy although some of her islands of epithelialization seem to have regressed. No evidence of infection 11/16/14; lites surface debridement done of surface eschar. The wound does not look to be unhealthy. No evidence of infection. Unfortunately the patient has had podiatry issues in the right foot and for some reason has redeveloped small surface ulcerations in the medial right ankle. Her original presentation involved wounds in this area 11/23/14 no debridement. The area on the right ankle has enlarged. The left ankle wound appears stable in terms of the surface although there is periwound inflammation. There has been regression in the amount of new skin 11/30/14 no debridement. Both wound areas appear healthy. There was no evidence of infection. The the new area on the right medial ankle has enlarged although that both the surfaces appear to be stable. 12/07/14; Debridement of the right medial ankle wound. No no debridement was done on the left. 12/14/14 no major change in and now bilateral medial ankle wounds. Both of these are very painful but the no overt evidence of infection. She has had previous venous ablation 12/21/14; patient states that her right medial ankle wound is considerably more painful last week than usual. Her left is also somewhat painful. She could not tolerate debridement. The right medial ankle wound has fibrinous surface eschar 12/28/14 this is a patient with severe bilateral venous insufficiency ulcers. For a considerable period of time we actually had the one on the right medial  ankle healed however this recently opened up again in June. The left medial ankle wound has been a refractory area with some absent flows. We had some success with Hydrofera Blue on this area and it literally closed by 50% however it is recently opened up Foley. Both of these were debridement today of surface eschar. She tolerates this poorly 01/25/15: No change in the status of this. Thick adherent escar. Very poor tolerance of any attempt at debridement. I had healed the right medial malleolus wound for a considerable amount of time and had the left one down to about 50% of the volume although this is totally regressed over the last 48 weeks. Further the right leg has reopened. she is trying to make a appointment with pain and vascular, previous ablations with Dr. Aleda Grana. I do not believe there is an arterial insufficiency issue here 02/01/15 the status of the adherent eschar bilaterally is actually improved. No debridement was done. She did not manage to get vascular studies done 02/08/15 continued debridement of the area was done today. The slough is less adherent and comes off with less pressure. There is no surrounding infection peripheral pulses are intact 02/15/15 selective debridement with a disposable curette. Again the slough is less adherent and comes off with less difficulty. No surrounding infection peripheral pulses are intact. 02/22/15 selective debridement of the right medial ankle wound. Slough comes off with less difficulty. No obvious surrounding infection peripheral pulses are intact I did not debridement the one on the left. Both of these are stable to improved 03/01/15 selective debridement of both wound areas using a curette to. Adherent slough cup soft with less difficulty. No obvious surrounding infection. The patient tells me that 2 days ago she noted a rash above the right leg wrap. She did not have this on her lower legs when she change this over she arrives  with widespread left greater than right almost folliculitis-looking rash which is  extremely pruritic. I don't see anything to culture here. There is no rash on the rest of her body. She feels well systemically. 03/08/15; selective debridement of both wounds using a curette. Base of this does not look unhealthy. She had limegreen drainage coming out of the left leg wound and describes a lot of drainage. The rash on her left leg looks improved to. No cultures were done. 03/22/15; patient was not here last week. Basal wounds does not look healthy and there is no surrounding erythema. No drainage. There is still a rash on the left leg that almost looks vasculitic however it is clearly limited to the top of where the wrap would be. 04/05/15; on the right required a surgical debridement of surface eschar and necrotic subcutaneous tissue. I did not debridement the area on the left. These continue to be large open wounds that are not changing that much. We were successful at one point in healing the area on the right, and at the same time the area on the left was roughly half the size of current measurements. I think a lot of the deterioration has to do with the prolonged time the patient is on her feet at work 04/19/15 I attempted-like surface debridement bilaterally she does not tolerate this. She tells me that she was in allergic care yesterday with extreme pain over her left lateral malleolus/ankle and was told that she has an "sprain" 05/03/15; large bilateral venous insufficiency wounds over the medial malleolus/medial aspect of her ankles. She complains of copious amounts of drainage and his usual large amounts of pain. There is some increasing erythema around the wound on the right extending into the medial aspect of her foot to. historically she came in with these wounds the right one healed and the left one came down to roughly half its current size however the right one is reopened and the left  is expanded. 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 05/10/15 large bilateral venous insufficiency wounds. There is less adherence surface left however the surface culture that I did last week grew pseudomonas therefore bilateral selective debridement score necessary. There is surrounding erythema. The patient describes severe bilateral drainage and a lot of pain in the left ankle. Apparently her podiatrist was were ready to do a cortisone shot 05/17/15; the patient complains of pain and again copious amounts of drainage. 05/24/15; we used Iodo flex last week. Patient notes considerable improvement in wound drainage. Only needed to change this once. 05/31/15; we continued Iodoflex; the base of these large wounds bilaterally is not too bad but there is probably likely a significant bioburden here. I would like to debridement just doesn't tolerate it. 06/06/14 I would like to continue the Iodoflex although she still hasn't managed to obtain supplies. She has bilateral medial malleoli or large wounds which are mostly superficial. Both of them are covered circumferentially with some nonviable fibrinous slough although she tolerates debridement very poorly. She apparently has an appointment for an ablation on the right leg by interventional radiology. 06/14/15; the patient arrives with the wounds and static condition. We attempted a debridement although she does not do well with this secondary to pain. I 07/05/15; wounds are not much smaller however there appears to be a cleaner granulating base. The left has tight fibrinous slough greater than the right. Debridement is tolerated poorly due to pain. Iodoflex is done more for these wounds in any of  the multitude of different dressings I have tried on the left 1 and then subsequently the right. 07/12/15; no change in the condition of this wound. I am able to do an aggressive debridement on the right but not the  left. She simply cannot tolerate it. We have been using Iodoflex which helps somewhat. It is worthwhile remembering that at one point we healed the right medial ankle wound and the left was about 25% of the current circumference. We have suggested returning to vascular surgery for review of possible further ablations for one reason or another she has not been able to do this. 07/26/15 no major change in the condition of either wound on her medial ankle. I did not attempt to debridement of these. She has been aggressively scrubbing these while she is in the shower at home. She has her supply of Iodoflex which seems to have done more for these wounds then anything I have put on recently. 08/09/15 wound area appears larger although not verified by measurements. Using Iodoflex 09/05/2015 -- she was here for avisit today but had significant problems with the wound and I was asked to see her for a physician opinion. I have summarize that this lady has had surgery on her left lower extremity about 10 years ago where the possible veins stripping was done. She has had an opinion from interventional radiology around November 2016 where no further sclerotherapy was ordered. The patient works 12 hours a day and stands on a concrete floor with work boots and is unable to get the proper compression she requires and cannot elevate her limbs appropriately at any given time. She has recently grown Pseudomonas from her wound culture but has not started her ciprofloxacin which was called in for her. 09/13/15 this continues to be a difficult situation for this patient. At one point I had this wound down to a 1.5 x 1.5" wound on her left leg. This is deteriorated and the right leg has reopened. She now has substantial wounds on her medial calcaneus, malleoli and into her lower leg. One on the left has surface eschar but these are far too painful for me to debridement here. She has a vascular surgery appointment next week to  see if anything can be done to help here. I think she has had previous ablations several years ago at [facility]. She has no major edema. She tells me that she did not get product last time [facility]] and went for several days without it. She continues to work in work boots 12 hours a day. She cannot get compression/4-layer under her work boots. 09/20/15 no major change. Periwound edema control was not very good. Her point with pain and vascular is next Wednesday the 25th 09/28/15; the patient is seen vascular surgery and is apparently scheduled for repeat duplex ultrasounds of her bilateral lower legs next week. 10/05/15; the patient was seen by Dr. Doren Custard of vascular surgery. He feels that she should have arterial insufficiency excluded as cause/contributed to her nonhealing stage she is therefore booked for an arteriogram. She has apparently monophasic signals in the dorsalis pedis pulses. She also of course has known severe chronic venous insufficiency with previous procedures as noted previously. I had another long discussion with the patient today about her continuing to work 12 hour shifts. I've written her out for 2 months area had concerns about this as her work location is currently undergoing significant turmoil and this may lead to her termination. She is aware of  this however I agree with her that she simply cannot continue to stand for 12 hours multiple days a week with the substantial wound areas she has. 10/19/15; the Dr. Doren Custard appointment was largely for an arteriogram which was normal. She does not have an arterial issue. He didn't make a comment about her chronic venous insufficiency for which she has had previous ablations. Presumably it was not felt that anything additional could be done. The patient is now out of work as I prescribed 2 weeks ago. Her wounds look somewhat less aggravated presumably because of this. I felt I would give debridement another try today 10/25/15; no major  change in this patient's wounds. We are struggling to get her product that she can afford into her own home through her insurance. 11/01/15; no major change in the patient's wounds. I have been using silver alginate as the most affordable product. I spoke to Dr. Marla Roe last week with her requested take her to the OR for surgical debridement and placement of ACEL. Dr. Marla Roe told me that she would be willing to do this however [facility] will not cover this, fortunately the patient has [place] healthcare of some variant 11/08/15; no major change in the patient's wounds. She has been completely nonviable surface that this but is in too much pain with any attempted debridement are clinic. I have arranged for her to see Dr. Marla Roe ham of plastic surgery and this appointment is on Monday. I am hopeful that they will take her to the OR for debridement, possible ACEL ultimately possible skin graft 11/22/15 no major change in the patient's wounds over her bilateral medial calcaneus medial malleolus into the lower legs. Surface on these does not look too bad however on the left there is surrounding erythema and tenderness. This may be cellulitis or could him sleepy tinea. 11/29/15; no major changes in the patient's wounds over her bilateral medial malleolus. There is no infection here and I don't think any additional antibiotics are necessary. There is now plan to move forward. She sees Dr. Marla Roe in a week's time for preparation for operative debridement and ACEL placement I believe on 7/12. She then has a follow-up appointment with Dr. Marla Roe on 7/21 12/28/15; the patient returns today having been taken to the [place] by Dr. Marla Roe 12/12/15 she underwent debridement, intraoperative cultures [which were negative]. She had placement of a wound VAC. Parent really ACEL was not available to be placed. The wound VAC foam apparently adhered to the wound since then she's been using silver  alginate, Xeroform under Ace wraps. She still says there is a lot of drainage and a lot of pain 01/31/16; this is a patient I see monthly. I had referred her to Dr. Marla Roe him of plastic surgery for large wounds on her bilateral medial ankles. She has been to the OR twice once in early July and once in early August. She tells me over the last 3 weeks she has been using the wound VAC with ACEL underneath it. On the right we've simply been using silver alginate. Under Kerlix Coban wraps. 02/28/16; this is a patient I'm currently seeing monthly. She is gone on to have a skin graft over her large venous insufficiency ulcer on the left medial ankle. This was done by Dr. Marla Roe him. The patient is a bit perturbed about why she didn't have one on her right medial ankle wound. She has been using silver alginate to this. 03/06/16; I received a phone call from  her plastic surgery Dr. Marla Roe. She expressed some concern about the viability of the skin graft she did on the left medial ankle wound. Asked me to place Endoform on this. She told me she is not planning to do a subsequent skin graft on the right as the left one did not take very well. I had placed Hydrofera Blue on the right 03/13/16; continue to have a reasonably healthy wound on the right medial ankle. Down to 3 mm in terms of size. There is epithelialization here. The area on the left medial ankle is her skin graft site. I suppose the last week this looks somewhat better. She has an open area inferiorly however in the center there appears to be some viable tissue. There is a lot of surface callus and eschar that will eventually need to come off however none of this looked to be infected. Patient states that the is able to keep the dressing on for several days which is an improvement. 03/20/16 no major change in the circumference of either wound however on the left side the patient was at Dr. Eusebio Friendly office and they did a debridement of  left wound. 50% of the wound seems to be epithelialized. I been using Endoform on the left Hydrofera Blue in the right 03/27/16; she arrives today with her wound is not looking as healthy as they did last week. The area on the right clearly has an adherent surface to this a very similar surface on the left. Unfortunately for this patient this is all too familiar problem. Clearly the Endoform is not working and will need to change that today that has some potential to help this surface. She does not tolerate debridement in this clinic very well. She is changing the dressing wants 04/03/16; patient arrives with the wounds looking somewhat better especially on the right. Dr. Migdalia Dk change the dressing to silver alginate when she saw her on Monday and also sold her some compression socks. The usefulness of the latter is really not clear and woman with severely draining wounds. 04/10/16; the patient is doing a bit of an experiment wearing the compression stockings that Dr. Migdalia Dk provided her to her left leg and the out of legs based dressings that we provided to the right. 05/01/16; the patient is continuing to wear compression stockings Dr. Migdalia Dk provided her on the left that are apparently silver impregnated. She has been using Iodoflex to the right leg wound. Still a moderate amount of drainage, when she leaves here the wraps only last for 4 days. She has to change the stocking on the left leg every night 05/15/16; she is now using compression stockings bilaterally provided by Dr. Marla Roe. She is wearing a nonadherent layer over the wounds so really I don't think there is anything specific being done to this now. She has some reduction on the left wound. The right is stable. I think all healing here is being done without a specific dressing 06/09/16; patient arrives here today with not much change in the wound certainly in diameter to large circular wounds over the medial aspect of her  ankle bilaterally. Under the light of these services are certainly not viable for healing. There is no evidence of surrounding infection. She is wearing compression stockings with some sort of silver impregnation as prescribed by Dr. Marla Roe. She has a follow-up with her tomorrow. 06/30/16; no major change in the size or condition of her wounds. These are still probably covered with a nonviable surface. She is  using only her purchase stockings. She did see Dr. Marla Roe who seemed to want to apply Dakin's solution to this I'm not extreme short what value this would be. I would suggest Iodoflex which she still has at home. 07/28/16; I follow Mrs. Jane episodically along with Dr. Marla Roe. 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 She has been applying a topical collagen ointment to both wounds with Adaptic. I don't think Dr. Marla Roe is planning to take her back to the OR. 08/19/16; I follow Mrs. Jeneen Rinks on a monthly basis along with Dr. Marla Roe of plastic surgery. 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 I been following her for a number of years. At one point I was able to get the right medial malleolus wound to heal and had the left medial malleolus down to about half its current size however and I had to send her to plastic surgery for an operative debridement. Since then things have been stable to slightly improve the area on the right is slightly better one in the left about the same although there is much less adherent surface than I'm used to with this patient. She is using some form of liquid collagen gel that Dr. Marla Roe provided a Kerlix cover with the patient's own pressure stockings. She tells me that she has extreme pain in both ankles and along the lateral aspect of both feet. She has been unable to work for some period of time. She is telling  me she is retiring at the beginning of April. She sees Dr. Doran Durand of orthopedics next week 09/22/16; patient has not seen Dr. Marla Roe since the last time she is here. I'm not really sure what she is using to the wounds other than bits and pieces of think she had left over including most recently Hydrofera Blue. She is using juxtalite stockings. She is having difficulty with her husband's recent illness "stroke". She is having to transport him to various doctors appointments. Dr. Marla Roe left her the option of a repeat debridement with ACEL however she has not been able to get the time to follow-up on this. She continues to have a fair amount of drainage out of these wounds with certainly precludes leaving dressings on all week 10/13/16; patient has not seen Dr. Marla Roe since she was last in our clinic. I'm not really sure what she is doing with the wounds, we did try to get her [facility] and I think she is actually using this most of the time. Because of drainage she states she has to change this every second day although this is an improvement from what she used to do. She went to see Dr. Doran Durand who did not think she had a muscular issue with regards to her feet, he referred her to a neurologist and I think the appointment is sometime in June. I changed her back to Iodoflex which she has used in the past but not recently. 11/03/16; the patient has been using Iodoflex although she ran out of this. Still claims that there is a lot of drainage although the wound does not look like this. No surrounding erythema. She has not been back to see Dr. Marla Roe 11/24/16; the patient has been using Iodoflex again but she ran out of it 2 or 3 days ago. There is no major change in the condition of either one of these wounds in fact they are larger  and covered in a thick adherent surface slough/nonviable tissue especially on the left. She does not tolerate mechanical debridement in our clinic. Going back  to see Dr. Marla Roe of plastic surgery for an operative debridement would seem reasonable. 12/15/16; the patient has not been back to see Dr. Marla Roe. She is been dealing with a series of illnesses and her husband which of monopolized her time. She is been using Sorbact which we largely supplied. She states the drainage is bad enough that it maximum she can go 2-3 days without changing the dressing 01/12/2017 -- the patient has not been back for about 4 weeks and has not seen Dr. Marla Roe not does she have any appointment pending. 01/23/17; patient has not seen Dr. Marla Roe even though I suggested this previously. She is using Santyl that was suggested last week by Dr. Con Memos this Cost her $16 through her insurance which is indeed surprising 02/12/17; continuing Santyl and the patient is changing this daily. A lot of drainage. She has not been back to see plastic surgery she is using an Ace wrap. Our intake nurse suggested wrap around stockings which would make a good reasonable alternative 02/26/17; patient is been using Santyl and changing this daily due to drainage. She has not been to see plastic surgery she uses in April Ace wrap to control the edema. She did obtain extremitease stockings but stated that the edema in her leg was to big for these 03/20/17; patient is using Santyl and Anasept. Surfaces looked better today the area on the right is actually measuring a little smaller. She has states she has a lot of pain in her feet and ankles and is asking for a consult to pain control which I'll try to help her with through our case manager. 04/10/17; the patient arrives with better-looking wound surfaces and is slightly smaller wound on the left she is using a combination of Santyl and Anasept. She has an appointment or at least as started in the pain control center associated with [facility] 05/14/17; this is a patient who I followed for a prolonged period of time. 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 At one point I had this down to a much smaller wound on the left however these reopened and we've never been able to get these to heal. She has been using Santyl and Anasept gel although 2 weeks ago she ran out of the Anasept gel. She has a stable appearance of the wound. She is going to the wound care clinic at [facility]. They wanted do a nerve block/spinal block although she tells me she is reluctant to go forward with that. 05/21/17; this is a patient I have followed for many years. 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 Chronic pain and deformity in her ankles as well. She is been to see plastic surgery as well as orthopedics. Using PolyMem AG most recently/Kerramax/ABDs and 2 layer compression. She has managed to keep this on and she is coming in for a nurse check to change the dressing on Tuesdays, we see her on Fridays 06/05/17; really quite a good looking surface and the area especially on the right medial has contracted in terms of dimensions. Well granulated healthy-looking tissue on both sides. Even with an open curet there is nothing that even feels abnormal here. This is as good as I've seen this in quite some time. We have been using PolyMem AG and bringing her in for a nurse  check 06/12/17; really quite good surface on both of these wounds. The right medial has contracted a bit left is not. We've been using PolyMem and AG and she is coming in for a nurse visit 06/19/17; we have been using PolyMem AG and bringing her in for a nurse check. Dimensions of her wounds are not better but the surfaces looked better bilaterally. She complained of bleeding last night and the left wound and increasing pain bilaterally. She states her wound pain is more neuropathic than just the wounds. There was some suggestion that this was radicular from her pain management doctor in talking to her it is really difficult to sort  this out. 06/26/17; using PolyMem and AG and bringing her in for a nurse check as All of this and reasonably stable condition. Certainly not improved. The dimensions on the lateral part of the right leg look better but not really measuring better. The medial aspect on the left is about the same. 07/03/16; we have been using PolyMen AG and bringing her in for a nurse check to change the dressings as the wounds have drainage which precludes once weekly changing. We are using all secondary absorptive dressings.our intake nurse is brought up the idea of using a wound VAC/snap VAC on the wound to help with the drainage to see if this would result in some contraction. This is not a bad idea. The area on the right medial is actually looking smaller. Both wounds have a reasonable-looking surface. There is no evidence of cellulitis. The edema is well controlled 07/10/17; the patient was denied for a snap VAC by her insurance. The major issue with these wounds continues to be drainage. We are using wicked PolyMem AG and she is coming in for a nurse visit to change this. The wounds are stable to slightly improved. The surface looks vibrant and the area on the right certainly has shrunk in size but very slowly 07/17/17; the patient still has large wounds on her bilateral medial malleoli. Surface of both of these wounds looks better. The dimensions seem to come and go but no consistent improvement. There is no epithelialization. We do not have options for advanced treatment products due to insurance issues. They did not approve of the wound VAC to help control the drainage. More recently we've been using PolyMem and AG wicked to allow drainage through. We have been bringing her in for a nurse visit to change this. We do not have a lot of options for wound care products and the home again due to insurance issues 07/24/17; the patient's wound actually looks somewhat better today. No drainage measurements are smaller still  healthy-looking surface. We used silver collagen under PolyMen started last week. We have been bringing her in for a dressing change 07/31/17; patient's wound surface continued to look better and I think there is visible change in the dimensions of the wound on the right. Rims of epithelialization. We have been using silver collagen under PolyMen and bringing her in for a dressing change. There appears to be less drainage although she is still in need of the dressing change 08/07/17. Patient's wound surface continues to look better on both sides and the area on the right is definitely smaller. We have been using silver collagen and PolyMen. She feels that the drainage has been it has been better. I asked her about her vascular status. She went to see Dr. Aleda Grana at [facility] and had some form of ablation. I don't have much detail  on this. I haven't my notes from 2016 that she was not a candidate for any further ablation but I don't have any more information on this. We had referred her to vein and vascular I don't think she ever went. He does not have a history of PAD although I don't have any information on this either. We don't even have ABIs in our record 08/14/17; we've been using silver collagen and PolyMen cover. And putting the patient and compression. She we are bringing her in as a nurse visit to change this because ofarge amount of drainage. We didn't the ABIs in clinic today since they had been done in many moons 1.2 bilaterally. She has been to see vein and vascular however this was at [facility] and she had ablation although I really don't have any information on this all seemed biking get a report. She is also been operatively debrided by plastic surgery and had a cell placed probably 8-12 months ago. This didn't have a major effect. We've been making some gains with current dressings 08/19/17-She is here in follow-up evaluation for bilateral medial malleoli ulcers. She continues  to tolerate debridement very poorly. We will continue with recently changed topical treatment; if no significant improvement may consider switching to Iodosorb/Iodoflex. 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 She has been using silver collagen and PolyMem. I believe she has been used and tried on Iodoflex before. 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 These have been very refractory over time. We have been using silver collagen and PolyMen. She is coming in once a week for a doctor's and once a week for nurse visits. We are actually making some progress 09/18/17; the patient's wounds are smaller especially on the right medial. She arrives today to upset to consider even washing these off with Anasept which I think is been part of the reason this is been closing. We've been using collagen covered in PolyMen otherwise. It is noted that she has a small area of folliculitis on the right medial calf that. As we are wrapping her legs I'll give her a short course of doxycycline to make sure this doesn't amount to anything. She is a long list of complaints today including imbalance, shortness of breath on exertion, inversion of her left ankle. With regards to the latter complaints she is been to see orthopedics and they offered her a tendon release surgery I believe but wanted her wounds to be closed first. I have recommended she go see her primary physician with regards to everything else. 09/25/17; patient's wounds are about the same size. We have made some progress bilaterally although not in recent weeks. She will not allow me T wash these wounds with Anasept even if she is doing her cell. Wheeze we've been using collagen covered in PolyMen. Last week she had a  small area of folliculitis this is now opened into a small wound. She completed 5 days of trimethoprim sulfamethoxazole 10/02/17; unfortunately the area on her left medial ankle is worse with a larger wound area towards the Achilles. The patient complains of a lot of pain. She will not allow debridement although visually I don't think there is anything to debridement in any case. We have been using silver collagen and PolyMen for several months  now. Initially we are making some progress although I'm not really seeing that today. We will move back to [facility]. His admittedly this is a bit of a repeat however I'm hoping that his situation is different now. The patient tells me she had her leg on the left give out on her yesterday this is process some pain. 10/09/17; the patient is seen twice a week largely because of drainage issues coming out of the chronic medial bimalleolar wounds that are chronic. Last week the dimensions of the one on the left looks a little larger I changed her to [facility]. She comes in today with a history of terrible pain in the bilateral wound areas. She will not allow debridement. She will not even allow a tissue culture. There is no surrounding erythema no no evidence of cellulitis. We have been putting her Kerlix Coban man. She will not allow more aggressive compression as there was a suggestion to put her in 3 layer wraps. 10/16/17; large wounds on her bilateral medial malleoli. These are chronic. Not much change from last week. The surface looks have healthy but absolutely no epithelialization. A lot of pain little less so of drainage. She will not allow debridement or even washing these off in the vigorous fashion with Anasept. 10/23/17; large wounds on her bilateral malleoli which are chronic. Some improvement in terms of size perhaps on the right since last time I saw these. She states that after we increased the 3 layer compression there was some bleeding, when  she came in for a nurse visit she did not want 3 layer compression put back on about our nurse managed to convince her. She has known chronic venous visit issues and I'm hoping to get her to tolerate the 3 layer compression. using Hydrofera Blue 10/30/17; absolutely no change in the condition of either wound although we've had some improvement in dimensions on the right.. Attempted to put her in 3 layer compression she didn't tolerated she is back in 2 layer compression. We've been using Hydrofera Blue We looked over her past records. She had venous reflux studies in November 2016. There was no evidence of deep venous reflux on the right. Superficial vein did not show the greater saphenous vein at think this is been previously ablated the small saphenous vein was within normal limits. The left deep venous system showed no DVT the vessels were positive for deep venous reflux in the posterior tibial veins at the ankle. The greater saphenous vein was surgically absent small saphenous vein was within normal limits. She went to vein and vascular at [facility]. I believe she had an ablation on the left greater saphenous vein. I'll update her reflux studies perhaps ever reviewed by vein and vascular. We've made absolutely no progress in these wounds. Will also try to read and TheraSkins through her insurance 11/06/17; W the patient apparently has a 2 week follow-up with vein and vascular I like him to review the whole issue with regards to her previous vascular workup by Dr. Aleda Grana. We've really made no progress on these wounds in many months. She arrives today with less viable looking surface on the left medial ankle wound. This was apparently looking about the same on Tuesday when she was here for nurse visit. 11/13/17; deep tissue culture I did last time of the left lower leg showed multiple organisms without any predominating. In particular no Staphylococcus or group A strep were isolated. We sent  her for venous reflux studies. She's  had a previous left greater saphenous vein stripping and I think sclerotherapy of the right greater saphenous vein. She didn't really look at the lesser saphenous vein this both wounds are on the medial aspect. She has reflux in the common femoral vein and popliteal vein and an accessory vein on the right and the common femoral vein and popliteal vein on the left. I'm going to have her go to see vein and vascular just the look over things and see if anything else beside aggressive compression is indicated here. We have not been able to make any progress on these wounds in spite of the fact that the surface of the wounds is never look too bad. 11/20/17; no major change in the condition of the wounds. Patient reports a large amount of drainage. She has a lot of complaints of pain although enlisting her today I wonder if some of this at least his neuropathic rather than secondary to her wounds. She has an appointment with vein and vascular on 12/30/17. The refractory nature of these wounds in my mind at least need vein and vascular to look over the wounds the recent reflux studies we did and her history to see if anything further can be done here. I also note her gait is deteriorated quite a bit. 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 She has a bilateral Trendelenburg gait. I wonder if this is neuropathic or perhaps multilevel radicular. 11/27/17; her wounds actually looks slightly better. Healthy-looking granulation tissue a scant amount of epithelialization. [place] healthcare will not pay for [employer]. They will play for tri layer Oasis and Dermagraft. This is not a diabetic ulcer. We'll try for the tri layer Oasis. She still complains of some drainage. She has a vein and vascular appointment on 12/30/17 12/04/17; the wounds visually look quite good. Healthy-looking granulation with some degree of epithelialization. We are still waiting for response to our  request for trial to try layer Oasis. Her appointment with vascular to review venous and arterial issues isn't sold the end of July 7/31. Not allow debridement or even vigorous cleansing of the wound surface. 12/18/17; slightly smaller especially on the right. 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 We've been using Hydrofera Blue. We still are looking into triple layer Oasis through her insurance 01/08/18 on evaluation today patient's wound actually appears to be showing signs of good improvement at this point in time. She has been tolerating the dressing changes without complication. Fortunately there does not appear to be any evidence of infection at this point in time. We have been utilizing silver nitrate which does seem to be of benefit for her which is also good news. Overall I'm very happy with how things seem to be both regards appearance as well as measurement. Patient did see Dr. Bridgett Larsson for evaluation on 12/30/17. In his assessment he felt that stripping would not likely add much more than chronic compression to the patient's healing process. His recommendation was to follow-up in three months with Dr. Doren Custard if she hasn't healed in order to consider referral back to you and see vascular where she previously was in a trial and was able to get her wound to heal. I'll be see what she feels she when you staying compression and he reiterated this as well. 01/13/18 on evaluation today patient appears to actually be doing very well in regard to her bilateral medial malleolus ulcers. She seems to have tolerated the chemical cauterization with silver nitrate last  week she did have some pain through that evening but fortunately states that I'll be see since it seems to be doing better she is overall pleased with the progress. 01/21/18; really quite a remarkable improvement since I've last seen these wounds. We started using silver nitrate specially on the islands of hyper  granulation which for some reason her around the wound circumference. This is really done quite nicely. Primary dressing Hydrofera Blue under 4 layer compression. She seems to be able to hold out without a nurse rewrap. Follow-up in 1 week 01/28/18; we've continued the hydrofera blue but continued with chemical cauterization to the wound area that we started about a month ago for irregular hyper granulation. She is made almost stunning improvement in the overall wound dimensions. I was not really expecting this degree of improvement in these chronic wounds 02/05/18; we continue with Hydrofera Bluebut of also continued the aggressive chemical cauterization with silver nitrate. We made nice progress with the right greater than left wound. 02/12/18. We continued with Hydrofera Blue after aggressive chemical cauterization with silver nitrate. We appear to be making nice progress with both wound areas 02/19/2018; we continue with [facility] after washing the wounds vigorously with Anasept spray and chemical cauterization with silver nitrate. We are making excellent progress. The area on the right's just about closed 02/26/2018. The area on the left medial ankle had too much necrotic debris today. I used a #5 curette we are able to get most of the soft. I continued with the silver nitrate to the much smaller wound on the right medial ankle she had a new area on her right lower pretibial area which she says was due to a role in her compression 03/05/2018; both wound areas look healthy. Not much change in dimensions from last week. I continue to use silver nitrate and Hydrofera Blue. The patient saw Dr. Doren Custard of vein and vascular. He felt she had venous stasis ulcers. He felt based on her previous arteriogram she should have adequate circulation for healing. Also she has deep venous reflux but really no significant correctable superficial venous reflux at this time. He felt we should continue with  conservative management including leg elevation and compression 04/02/2018; since we last saw this woman about a month ago she had a fall apparently suffered a pelvic fracture. I did not look up the x-ray. Nevertheless because of pain she literally was bedbound for 2 weeks and had home health coming out to change the dressing. Somewhat predictably this is resulted in considerable improvement in both wound areas. The right is just about closed on the medial malleolus and the left is about half the size. 04/16/2018; both her wounds continue to go down in size. Using Hydrofera Blue. 05/07/18; both her wounds appeared to be improving especially on the right where it is almost closed. We are using Hydrofera Blue 05/14/2018; slightly worse this week with larger wounds. Surface on the left medial not quite as good. We have been using Hydrofera Blue 05/21/18; again the wounds are slightly larger. Left medial malleolus slightly larger with eschar around the circumference. We have been using Hydrofera Blue undergoing a wraps for a prolonged period of time. This got a lot better when she was more recumbent due to a fall and a back injury. I change the primary dressing the silver alginate today. She did not tolerate a 4 layer compression previously although I may need to bring this up with her next time 05/28/2018; area on the left medial  malleolus again is slightly larger with more drainage. Area on the right is roughly unchanged. She has a small area of folliculitis on the right medial just on the lower calf. This does not look ominous. 06/03/2018 left medial malleolus slightly smaller in a better looking surface. We used silver nitrate on this last time with silver alginate. The area on the right appears slightly smaller 1/10; left medial malleolus slightly smaller. Small open area on the right. We used silver nitrate and silver alginate as of 2 weeks ago. We continue with the wound and compression. These got a  lot better when she was off her feet 1/17; right medial malleolus wound is smaller. The left may be slightly smaller. Both surfaces look somewhat better. 1/24; both wounds are slightly smaller. Using silver alginate under Unna boots 1/31; both wounds appear smaller in fact the area on the right medial is just about closed. Surface eschar. We have been using silver alginate under Unna boots. The patient is less active now spends let much less time on her feet and I think this is contributed to the general improvement in the wound condition 2/7; both wounds appear smaller. I was hopeful the right medial would be closed however there there is still the same small open area. Slight amount of surface eschar on the left the dimensions are smaller there is eschar but the wound edges appear to be free. We have been using silver alginate under Unna boot's 2/14; both wounds once again measure smaller. Circumferential eschar on the left medial. We have been using silver alginate under Unna boots with gradual improvement 2/21; the area on the right medial malleolus has healed. The area on the left is smaller. We have been using silver alginate and Unna boots. We can discharge wrapping the right leg she has 20/30 stockings at home she will need to protect the scar tissue in this area 2/28; the area on the right medial malleolus remains closed the patient has a compression stocking. The area on the left is smaller. We have been using silver alginate and Unna boots. 3/6 the area on the right medial ankle remains closed. Good edema control noted she is using her own compression stocking. The area on the left medial ankle is smaller. We have been managing this with silver alginate and Unna boots which we will continue today. 3/13; the area on the right medial ankle remains closed and I'm declaring it healed today. When necessary the left is about the same still a healthy-looking surface but no major change and wound  area. No evidence of infection and using silver alginate under unna and generally making considerable improvement 3/27 the area on the right medial ankle remains closed the area on the left is about the same as last week. Certainly not any worse we have been using silver alginate under an Unna boot 4/3; the area on the right medial ankle remains closed per the patient. We did not look at this wound. The wound on the left medial ankle is about the same surface looks healthy we have been using silver alginate under an Unna boot 4/10; area on the right medial ankle remains closed per the patient. We did not look at this wound. The wound on the left medial ankle is slightly larger. The patient complains that the [facility] caused burning pain all week. She also told us that she was a lot more active this week. Changed her back to silver alginate 4/17; right medial ankle still  closed per the patient. Left medial ankle is slightly larger. Using silver alginate. She did not tolerate Hydrofera Blue on this area 4/24; right medial ankle remains closed we have not look at this. The left medial ankle continues to get larger today by about a centimeter. We have been using silver alginate under Unna boots. She complains about 4 layer compression as an alternative. She has been up on her feet working on her garden 5/8; right medial ankle remains closed we did not look at this. The left medial ankle has increased in size about 100%. We have been using silver alginate under Unna boots. She noted increased pain this week and was not surprised that the wound is deteriorated 5/15; no major change in SA however much less erythema ( one week of doxy ocellulitis). 5/22-64 year old female returns at 1 week to the clinic for left medial ankle wound for which we have been using silver alginate under 3 layer compression She was placed on DOXY at last visit - the wound is wider at this visit. She is in 3 layer  compression 5/29; change to [facility] last week. I had given her empiric doxycycline 2 weeks ago for a week. She is in 3 layer compression. She complains of a lot of pain and drainage on presentation today. 6/5; using Hydrofera Blue. I gave her doxycycline recently empirically for erythema and pain around the wound. Believe her cultures showed enterococcus which not would not have been well covered by doxycycline nevertheless the wound looks better and I don't feel specifically that the enterococcus needs to be covered. She has a new what looks like a wrap injury on her lateral left ankle. 6/12; she is using Hydrofera Blue. She has a new area on the left anterior lower tibial area. This was a wrap injury last week. 6/19; the patient is using Hydrofera Blue. She arrived with marked inflammation and erythema around the wound and tenderness. 12/01/18 on evaluation today patient appears to be doing a little bit better based on what I'm hearing from the standpoint of lassos evaluation to this as far as the overall appearance of the wound is concerned. Then sometime substandard she typically sees Dr. Dellia Nims. Nonetheless overall very pleased with the progress that she's made up to this point. No fevers, chills, nausea, or vomiting noted at this time. 7/10; some improvement in the surface area. Aggressively debrided last week apparently. I went ahead with the debridement today although the patient does not tolerate this very well. We have been using Iodoflex. Still a fair amount of drainage 7/17; slightly smaller. Using Iodoflex. 7/24; no change from last week in terms of surface area. We have been using Iodoflex. Surface looks and continues to look somewhat better 7/31; surface area slightly smaller better looking surface. We have been using Iodoflex. This is under Unna boot compression 8/7-Patient presents at 1 week with Unna boot and Iodoflex, wound appears better 8/14-Patient presents at 1 week with  Iodoflex, we use the Unna boot, wound appears to be stable better.Patient is getting Botox treatment for the inversion of the foot for tendon release, Next week 8/21; we are using Iodoflex. Unna boot. The wound is stable in terms of surface area. Under illumination there is some areas of the wound that appear to be either epithelialized or perhaps this is adherent slough at this point I was not really clear. It did not wipe off and I was reluctant to debride this today. 8/28; we are using Iodoflex in an  Unna boot. Seems to be making good improvement. 9/4; using Iodoflex and wound is slightly smaller. 9/18; we are using Iodoflex with topical silver nitrate when she is here. The wound continues to be smaller 10/2; patient missed her appointment last week due to GI issues. She left and Iodoflex based dressing on for 2 weeks. Wound is about the same size about the size of a dime on the left medial lower 10/9 we have been using Iodoflex on the medial left ankle wound. She has a new superficial probable wrap injury on the dorsal left ankle 10/16; we have been using Hydrofera Blue since last week. This is on the left medial ankle 10/23; we have been using Hydrofera Blue since 2 weeks ago. This is on the left medial ankle. Dimensions are better 11/6; using Hydrofera Blue. I think the wound is smaller but still not closed. Left medial ankle 11/13; we have been using Hydrofera Blue. Wound is certainly no smaller this week. Also the surface not as good. This is the remanent of a very large area on her left medial ankle. 11/20; using Sorbact since last week. Wound was about the same in terms of size although I was disappointed about the surface debris 12/11; 3-week follow-up. Patient was on vacation. Wound is measuring slightly larger we have been using Sorbact. 12/18; wound is about the same size however surface looks better last week after debridement. We have been using Sorbact under compression 1/15 wound  is probably twice the size of last time increased in length nonviable surface. We have been using Sorbact. She was running a mild fever and missed her appointment last week 1/22; the wound is come down in size but under illumination still a very adherent debris we have been Hydrofera Blue that I changed her to last week 1/29; dimensions down slightly. We have been using Hydrofera Blue 2/19 dimensions are the same however there is rims of epithelialization under illumination. Therefore more the surface area may be epithelialized 2/26; the patient's wound actually measures smaller. The wound looks healthy. We have been using Hydrofera Blue. I had some thoughts about running Apligraf then I still may do that however this looks so much better this week we will delay that for now 3/5; the wound is small but about the same as last week. We have been using Hydrofera Blue. No debridement is required today. 3/19; the wound is about the size of a dime. Healthy looking wound even under illumination. We have been using Hydrofera Blue. No mechanical debridement is necessary 3/26; not much change from last week although still looks very healthy. We have been using Hydrofera Blue under Unna boots Patient was offered an ankle fusion by podiatry but not until the wound heals with a proceed with this. 4/9; the patient comes in today with her original wound on the medial ankle looking satisfactory however she has some uncontrolled swelling in the middle part of her leg with 2 new open areas superiorly just lateral to the tibia. I think this was probably a wrap issue. She said she felt uncomfortable during the week but did not call in. We have been using Hydrofera Blue 4/16; the wound on the medial ankle is about the same. She has innumerable small areas superior to this across her mid tibia. I think this is probably folliculitis. She is also been working in the yard doing a lot of sweating 4/30; the patient issue on  the upper areas across her mid tibia of all healed.  I think this was excessive yard work if I remember. Her wound on the medial ankle is smaller. Some debris on this we have been using Hydrofera Blue under Unna boots 5/7; mid tibia. She has been using Hydrofera Blue under an Unna wrap. She is apparently going for her ankle surgery on June 3 10/28/19-Patient returns to clinic with the ankle wound, we are using Hydrofera Blue under Unna wrap, surgery is scheduled for her left foot for June 3 so she will be back for nurse visit next week READMISSION 01/17/2020 Mrs. Stratmann is a 64 year old woman we have had in this clinic for a long period of time with severe venous hypertension and refractory wounds on her medial lower legs and ankles bilaterally. 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 When she was off her legs for a prolonged period example when she fell and suffered a compression fracture things would heal up quite nicely. She is now retired and we managed to heal up the right medial leg wound. The left one was very tiny last time I saw this although still refractory. 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 It got to the point where this was interfering with her walking and she elected to proceed with a ankle arthrodesis to straighten her her ankle and leave her with a functional outcome for mobilization. The patient was referred to Dr. Doren Custard and really this took some time to arrange. Dr. Doren Custard saw her on 12/07/2019. Once again he verified that she had no arterial issues. She had previously had an angiogram several years ago. Follow-up ABIs on the left showed an ABI of 1.12 with triphasic waveforms and a TBI of 0.92. She is felt to have chronic deep venous insufficiency but I do not think  it was felt that anything could be done from about this from an ablation point of view. At the time Dr. Doren Custard saw this patient the wounds actually look closed via the pictures in his clinic. The patient finally underwent her surgery on 12/15/2019. This went reasonably well and there was a good anatomic outcome. She developed a small distal wound dehiscence on the lateral part of the surgical wound. However more problematically she is developed recurrence of the wound on the medial left ankle. There are actually 2 wounds here one in the distal lower leg and 1 pretty much at the level of the medial malleolus. It is a more distal area that is more problematic. She has been using Hydrofera Blue which started on Friday before this she was simply Ace wrapping. There was a culture done that showed Pseudomonas and she is on ciprofloxacin. A recent CNS on 8/11 was negative. The patient reports some pain but I generally think this is improving. She is using a cam boot completely nonweightbearing using a walker for pivot transfers and a wheelchair 8/24; not much improvement unfortunately she has a surgical wound on the lateral part in the venous insufficiency wound medially. The bottom part of the medial insufficiency wound is still necrotic there is exposed tendon here. We have been using Hydrofera Blue under compression. Her edema control is however better 8/31; patient in for follow-up of his surgical wound on the lateral part of her left leg and chronic venous insufficiency ulcers medially. We put her  back in compression last week. She comes in today with a complaint of 3 or 4 days worth of increasing pain. She felt her cam walker was rubbing on the area on the back of her heel. However there is intense erythema seems more likely she has cellulitis. She had 2 cultures done when she was seeing podiatry in the postop. One of them in late July showed Pseudomonas and she received a course of ciprofloxacin the  other was negative on 8/11 she is allergic to penicillin with anaphylactoid complaints of hives oral swelling via information in epic 9/9; when I saw this patient last week she had intense anterior erythema around her wound on the right lateral heel and ankle and also into the right medial heel. Some of this was no doubt drainage and her walker boot however I was convinced she had cellulitis. I gave her Levaquin and Bactrim she is finishing up on this now. She is following up with Dr. [name] he saw her yesterday. He is taken her out of the walking boot of course she is still nonweightbearing. Her x-ray was negative for any worrisome features such as soft tissue air etc. Things are a lot better this week. She has home health. We have been using Hydrofera Blue under an [employer] which she put back on yesterday. I did not wrap her last week 9/17; her surrounding skin looks a lot better. In fact the area on the left lateral ankle has just a scant amount of eschar. The only remaining wound is the large area on the left medial ankle. Probably about 60% of this is healthy granulation at the surface however she has a significant divot distally. This has adherent debris in it. I been using debridement and silver collagen to try and get this area to fill-in although I do not think we have made much progress this week Electronic Signature(s) Signed: 02/20/2020 8:08:43 AM By: Linton Ham MD Entered By: Linton Ham on 02/17/2020 11:03:53 -------------------------------------------------------------------------------- Physical Exam Details Patient Name: Date of Service: [name], [name] 02/17/2020 10:00 A M Medical Record Number: [id number] Patient Account Number: [id number] Date of Birth/Sex: Treating RN: 20-Aug-1955 (64 y.o. [name] Primary Care Provider: [name] Other Clinician: Referring Provider: Treating Provider/Extender: [name] in Treatment:  4 Constitutional Sitting or standing Blood Pressure is within target range for patient.. Pulse regular and within target range for patient.Marland Kitchen Respirations regular, non-labored and within target range.. Temperature is normal and within the target range for the patient.Marland Kitchen Appears in no distress. Notes Wound exam; left medial ankle. This is a difficult wound because there is a superficial 60% of this but a divot distally. The the divot itself has necrotic debris on the surface which is difficult to remove. I used a #5 curette to try and remove some of this she does not tolerate this very well. There is no surrounding erythema. She has never had an arterial issue. Her swelling is well controlled On the left lateral ankle a small amount of eschar however this is looks like it is closed Electronic Signature(s) Signed: 02/20/2020 8:08:43 AM By: Linton Ham MD Entered By: Linton Ham on 02/17/2020 11:05:55 -------------------------------------------------------------------------------- Physician Orders Details Patient Name: Date of Service: [name], [name] 02/17/2020 10:00 A M Medical Record Number: [id number] Patient Account Number: [id number] Date of Birth/Sex: Treating RN: 1956-01-18 (64 y.o. [name] Primary Care Provider: [name] Other Clinician: Referring Provider: Treating Provider/Extender: Abelina Bachelor,  Noelle Weeks in Treatment: 4 Verbal / Phone Orders: No Diagnosis Coding ICD-10 Coding Code Description I87.332 Chronic venous hypertension (idiopathic) with ulcer and inflammation of left lower extremity L97.828 Non-pressure chronic ulcer of other part of left lower leg with other specified severity L03.116 Cellulitis of left lower limb L97.328 Non-pressure chronic ulcer of left ankle with other specified severity T81.31XD Disruption of external operation (surgical) wound, not elsewhere classified, subsequent encounter Follow-up Appointments Return  Appointment in 1 week. Dressing Change Frequency Other: - home health to change dressing twice a week. Skin Barriers/Peri-Wound Care Moisturizing lotion Wound Cleansing May shower with protection. - use cast protector Primary Wound Dressing Wound #15 Left,Medial Malleolus Silver Collagen - moisten with hydrogel. Secondary Dressing Wound #15 Left,Medial Malleolus Dry Gauze ABD pad Edema Control Unna Boot to Left Lower Extremity Avoid standing for long periods of time Elevate legs to the level of the heart or above for 30 minutes daily and/or when sitting, a frequency of: - throughout the day Off-Loading Other: - ensure no pressure to wound areas. [place] skilled nursing for wound care. - [facility] home health to change twice a week. Electronic Signature(s) Signed: 02/20/2020 8:08:43 AM By: Linton Ham MD Signed: 02/20/2020 1:33:30 PM By: Kela Millin Entered By: Kela Millin on 02/17/2020 10:18:05 -------------------------------------------------------------------------------- Problem List Details Patient Name: Date of Service: [name], [name] 02/17/2020 10:00 A M Medical Record Number: [id number] Patient Account Number: [id number] Date of Birth/Sex: Treating RN: 1956/04/16 (64 y.o. [name] Primary Care Provider: [name] Other Clinician: Referring Provider: Treating Provider/Extender: [name] in Treatment: 4 Active Problems ICD-10 Encounter Code Description Active Date MDM Diagnosis I87.332 Chronic venous hypertension (idiopathic) with ulcer and inflammation of left 01/17/2020 No Yes lower extremity L97.828 Non-pressure chronic ulcer of other part of left lower leg with other specified 01/17/2020 No Yes severity L03.116 Cellulitis of left lower limb 01/31/2020 No Yes L97.328 Non-pressure chronic ulcer of left ankle with other specified severity 01/17/2020 No Yes T81.31XD Disruption of  external operation (surgical) wound, not elsewhere classified, 01/17/2020 No Yes subsequent encounter Inactive Problems Resolved Problems Electronic Signature(s) Signed: 02/20/2020 8:08:43 AM By: Linton Ham MD Entered By: Linton Ham on 02/17/2020 11:02:13 -------------------------------------------------------------------------------- Progress Note Details Patient Name: Date of Service: [name], [name] 02/17/2020 10:00 A M Medical Record Number: [id number] Patient Account Number: [id number] Date of Birth/Sex: Treating RN: 1955/10/03 (64 y.o. [name] Primary Care Provider: [name] Other Clinician: Referring Provider: Treating Provider/Extender: [name] in Treatment: 4 Subjective History of Present Illness (HPI) the remaining wound is over the left medial ankle. Similar wound over the right medial ankle healed largely with use of Apligraf. Most recently we have been using Hydrofera Blue over this wound with considerable improvement. The patient has been extensively worked up in the past for her venous insufficiency and she is not a candidate for antireflux surgery although I have none of the details available currently. 08/24/14; considerable improvement today. About 50% of this wound areas now epithelialized. The base of the wound appears to be healthier granulation.as opposed to last week when she had deteriorated a considerable improvement 08/17/14; unfortunately the wound has regressed somewhat. The areas of epithelialization from the superior aspect are not nearly as healthy as they were last week. The patient thinks her Hydrofera Blue slipped. 09/07/14; unfortunately the area has markedly regressed in the 2 weeks since I've seen this. There is an odor surrounding  erythema. The healthy granulation tissue that we had at the base of the wound now is a dusky color. The nurse reports green drainage 09/14/14; the area looks somewhat  better than last week. There is less erythema and less drainage. The culture I did did not show any growth. Nevertheless I think it is better to continue the Cipro and doxycycline for a further week. The remaining wound area was debridement. 09/21/14. Wound did not require debridement last week. Still less erythema and less drainage. She can complete her antibiotics. The areas of epithelialization in the superior aspect of the wound do not look as healthy as they did some weeks ago 10/05/14 continued improvement in the condition of this wound. There is advancing epithelialization. Less aggressive debridement required 10/19/14 continued improvement in the condition and volume of this wound. Less aggressive debridement to the inferior part of this to remove surface slough and fibrinous eschar 11/02/14 no debridement is required. The surface granulation appears healthy although some of her islands of epithelialization seem to have regressed. No evidence of infection 11/16/14; lites surface debridement done of surface eschar. The wound does not look to be unhealthy. No evidence of infection. Unfortunately the patient has had podiatry issues in the right foot and for some reason has redeveloped small surface ulcerations in the medial right ankle. Her original presentation involved wounds in this area 11/23/14 no debridement. The area on the right ankle has enlarged. The left ankle wound appears stable in terms of the surface although there is periwound inflammation. There has been regression in the amount of new skin 11/30/14 no debridement. Both wound areas appear healthy. There was no evidence of infection. The the new area on the right medial ankle has enlarged although that both the surfaces appear to be stable. 12/07/14; Debridement of the right medial ankle wound. No no debridement was done on the left. 12/14/14 no major change in and now bilateral medial ankle wounds. Both of these are very painful but the no  overt evidence of infection. She has had previous venous ablation 12/21/14; patient states that her right medial ankle wound is considerably more painful last week than usual. Her left is also somewhat painful. She could not tolerate debridement. The right medial ankle wound has fibrinous surface eschar 12/28/14 this is a patient with severe bilateral venous insufficiency ulcers. For a considerable period of time we actually had the one on the right medial ankle healed however this recently opened up again in June. The left medial ankle wound has been a refractory area with some absent flows. We had some success with Hydrofera Blue on this area and it literally closed by 50% however it is recently opened up Foley. Both of these were debridement today of surface eschar. She tolerates this poorly 01/25/15: No change in the status of this. Thick adherent escar. Very poor tolerance of any attempt at debridement. I had healed the right medial malleolus wound for a considerable amount of time and had the left one down to about 50% of the volume although this is totally regressed over the last 48 weeks. Further the right leg has reopened. she is trying to make a appointment with pain and vascular, previous ablations with Dr. Aleda Grana. I do not believe there is an arterial insufficiency issue here 02/01/15 the status of the adherent eschar bilaterally is actually improved. No debridement was done. She did not manage to get vascular studies done 02/08/15 continued debridement of the area was done today. The slough is  less adherent and comes off with less pressure. There is no surrounding infection peripheral pulses are intact 02/15/15 selective debridement with a disposable curette. Again the slough is less adherent and comes off with less difficulty. No surrounding infection peripheral pulses are intact. 02/22/15 selective debridement of the right medial ankle wound. Slough comes off with less difficulty. No  obvious surrounding infection peripheral pulses are intact I did not debridement the one on the left. Both of these are stable to improved 03/01/15 selective debridement of both wound areas using a curette to. Adherent slough cup soft with less difficulty. No obvious surrounding infection. The patient tells me that 2 days ago she noted a rash above the right leg wrap. She did not have this on her lower legs when she change this over she arrives with widespread left greater than right almost folliculitis-looking rash which is extremely pruritic. I don't see anything to culture here. There is no rash on the rest of her body. She feels well systemically. 03/08/15; selective debridement of both wounds using a curette. Base of this does not look unhealthy. She had limegreen drainage coming out of the left leg wound and describes a lot of drainage. The rash on her left leg looks improved to. No cultures were done. 03/22/15; patient was not here last week. Basal wounds does not look healthy and there is no surrounding erythema. No drainage. There is still a rash on the left leg that almost looks vasculitic however it is clearly limited to the top of where the wrap would be. 04/05/15; on the right required a surgical debridement of surface eschar and necrotic subcutaneous tissue. I did not debridement the area on the left. These continue to be large open wounds that are not changing that much. We were successful at one point in healing the area on the right, and at the same time the area on the left was roughly half the size of current measurements. I think a lot of the deterioration has to do with the prolonged time the patient is on her feet at work 04/19/15 I attempted-like surface debridement bilaterally she does not tolerate this. She tells me that she was in allergic care yesterday with extreme pain over her left lateral malleolus/ankle and was told that she has an "sprain" 05/03/15; large bilateral venous  insufficiency wounds over the medial malleolus/medial aspect of her ankles. She complains of copious amounts of drainage and his usual large amounts of pain. There is some increasing erythema around the wound on the right extending into the medial aspect of her foot to. historically she came in with these wounds the right one healed and the left one came down to roughly half its current size however the right one is reopened and the left is expanded. 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 05/10/15 large bilateral venous insufficiency wounds. There is less adherence surface left however the surface culture that I did last week grew pseudomonas therefore bilateral selective debridement score necessary. There is surrounding erythema. The patient describes severe bilateral drainage and a lot of pain in the left ankle. Apparently her podiatrist was were ready to do a cortisone shot 05/17/15; the patient complains of pain and again copious amounts of drainage. 05/24/15; we used Iodo flex last week. Patient notes considerable improvement in wound drainage. Only needed to change this once. 05/31/15; we continued Iodoflex; the base of these large wounds bilaterally is  not too bad but there is probably likely a significant bioburden here. I would like to debridement just doesn't tolerate it. 06/06/14 I would like to continue the Iodoflex although she still hasn't managed to obtain supplies. She has bilateral medial malleoli or large wounds which are mostly superficial. Both of them are covered circumferentially with some nonviable fibrinous slough although she tolerates debridement very poorly. She apparently has an appointment for an ablation on the right leg by interventional radiology. 06/14/15; the patient arrives with the wounds and static condition. We attempted a debridement although she does not do well with this secondary to pain. I 07/05/15; wounds are  not much smaller however there appears to be a cleaner granulating base. The left has tight fibrinous slough greater than the right. Debridement is tolerated poorly due to pain. Iodoflex is done more for these wounds in any of the multitude of different dressings I have tried on the left 1 and then subsequently the right. 07/12/15; no change in the condition of this wound. I am able to do an aggressive debridement on the right but not the left. She simply cannot tolerate it. We have been using Iodoflex which helps somewhat. It is worthwhile remembering that at one point we healed the right medial ankle wound and the left was about 25% of the current circumference. We have suggested returning to vascular surgery for review of possible further ablations for one reason or another she has not been able to do this. 07/26/15 no major change in the condition of either wound on her medial ankle. I did not attempt to debridement of these. She has been aggressively scrubbing these while she is in the shower at home. She has her supply of Iodoflex which seems to have done more for these wounds then anything I have put on recently. 08/09/15 wound area appears larger although not verified by measurements. Using Iodoflex 09/05/2015 -- she was here for avisit today but had significant problems with the wound and I was asked to see her for a physician opinion. I have summarize that this lady has had surgery on her left lower extremity about 10 years ago where the possible veins stripping was done. She has had an opinion from interventional radiology around November 2016 where no further sclerotherapy was ordered. The patient works 12 hours a day and stands on a concrete floor with work boots and is unable to get the proper compression she requires and cannot elevate her limbs appropriately at any given time. She has recently grown Pseudomonas from her wound culture but has not started her ciprofloxacin which was  called in for her. 09/13/15 this continues to be a difficult situation for this patient. At one point I had this wound down to a 1.5 x 1.5" wound on her left leg. This is deteriorated and the right leg has reopened. She now has substantial wounds on her medial calcaneus, malleoli and into her lower leg. One on the left has surface eschar but these are far too painful for me to debridement here. She has a vascular surgery appointment next week to see if anything can be done to help here. I think she has had previous ablations several years ago at [facility]. She has no major edema. She tells me that she did not get product last time [facility] and went for several days without it. She continues to work in work boots 12 hours a day. She cannot get compression/4-layer under her work boots. 09/20/15 no major  change. Periwound edema control was not very good. Her point with pain and vascular is next Wednesday the 25th 09/28/15; the patient is seen vascular surgery and is apparently scheduled for repeat duplex ultrasounds of her bilateral lower legs next week. 10/05/15; the patient was seen by Dr. Doren Custard of vascular surgery. He feels that she should have arterial insufficiency excluded as cause/contributed to her nonhealing stage she is therefore booked for an arteriogram. She has apparently monophasic signals in the dorsalis pedis pulses. She also of course has known severe chronic venous insufficiency with previous procedures as noted previously. I had another long discussion with the patient today about her continuing to work 12 hour shifts. I've written her out for 2 months area had concerns about this as her work location is currently undergoing significant turmoil and this may lead to her termination. She is aware of this however I agree with her that she simply cannot continue to stand for 12 hours multiple days a week with the substantial wound areas she has. 10/19/15; the Dr. Doren Custard appointment was  largely for an arteriogram which was normal. She does not have an arterial issue. He didn't make a comment about her chronic venous insufficiency for which she has had previous ablations. Presumably it was not felt that anything additional could be done. The patient is now out of work as I prescribed 2 weeks ago. Her wounds look somewhat less aggravated presumably because of this. I felt I would give debridement another try today 10/25/15; no major change in this patient's wounds. We are struggling to get her product that she can afford into her own home through her insurance. 11/01/15; no major change in the patient's wounds. I have been using silver alginate as the most affordable product. I spoke to Dr. Marla Roe last week with her requested take her to the OR for surgical debridement and placement of ACEL. Dr. Marla Roe told me that she would be willing to do this however [facility] will not cover this, fortunately the patient has [place] healthcare of some variant 11/08/15; no major change in the patient's wounds. She has been completely nonviable surface that this but is in too much pain with any attempted debridement are clinic. I have arranged for her to see Dr. Marla Roe ham of plastic surgery and this appointment is on Monday. I am hopeful that they will take her to the OR for debridement, possible ACEL ultimately possible skin graft 11/22/15 no major change in the patient's wounds over her bilateral medial calcaneus medial malleolus into the lower legs. Surface on these does not look too bad however on the left there is surrounding erythema and tenderness. This may be cellulitis or could him sleepy tinea. 11/29/15; no major changes in the patient's wounds over her bilateral medial malleolus. There is no infection here and I don't think any additional antibiotics are necessary. There is now plan to move forward. She sees Dr. Marla Roe in a week's time for preparation for operative  debridement and ACEL placement I believe on 7/12. She then has a follow-up appointment with Dr. Marla Roe on 7/21 12/28/15; the patient returns today having been taken to the [facility] by Dr. Marla Roe 12/12/15 she underwent debridement, intraoperative cultures [which were negative]. She had placement of a wound VAC. Parent really ACEL was not available to be placed. The wound VAC foam apparently adhered to the wound since then she's been using silver alginate, Xeroform under Ace wraps. She still says there is a lot of drainage  and a lot of pain 01/31/16; this is a patient I see monthly. I had referred her to Dr. Marla Roe him of plastic surgery for large wounds on her bilateral medial ankles. She has been to the OR twice once in early July and once in early August. She tells me over the last 3 weeks she has been using the wound VAC with ACEL underneath it. On the right we've simply been using silver alginate. Under Kerlix Coban wraps. 02/28/16; this is a patient I'm currently seeing monthly. She is gone on to have a skin graft over her large venous insufficiency ulcer on the left medial ankle. This was done by Dr. Marla Roe him. The patient is a bit perturbed about why she didn't have one on her right medial ankle wound. She has been using silver alginate to this. 03/06/16; I received a phone call from her plastic surgery Dr. Marla Roe. She expressed some concern about the viability of the skin graft she did on the left medial ankle wound. Asked me to place Endoform on this. She told me she is not planning to do a subsequent skin graft on the right as the left one did not take very well. I had placed Hydrofera Blue on the right 03/13/16; continue to have a reasonably healthy wound on the right medial ankle. Down to 3 mm in terms of size. There is epithelialization here. The area on the left medial ankle is her skin graft site. I suppose the last week this looks somewhat better. She has an open area  inferiorly however in the center there appears to be some viable tissue. There is a lot of surface callus and eschar that will eventually need to come off however none of this looked to be infected. Patient states that the is able to keep the dressing on for several days which is an improvement. 03/20/16 no major change in the circumference of either wound however on the left side the patient was at Dr. Eusebio Friendly office and they did a debridement of left wound. 50% of the wound seems to be epithelialized. I been using Endoform on the left Hydrofera Blue in the right 03/27/16; she arrives today with her wound is not looking as healthy as they did last week. The area on the right clearly has an adherent surface to this a very similar surface on the left. Unfortunately for this patient this is all too familiar problem. Clearly the Endoform is not working and will need to change that today that has some potential to help this surface. She does not tolerate debridement in this clinic very well. She is changing the dressing wants 04/03/16; patient arrives with the wounds looking somewhat better especially on the right. Dr. Migdalia Dk change the dressing to silver alginate when she saw her on Monday and also sold her some compression socks. The usefulness of the latter is really not clear and woman with severely draining wounds. 04/10/16; the patient is doing a bit of an experiment wearing the compression stockings that Dr. Migdalia Dk provided her to her left leg and the out of legs based dressings that we provided to the right. 05/01/16; the patient is continuing to wear compression stockings Dr. Migdalia Dk provided her on the left that are apparently silver impregnated. She has been using Iodoflex to the right leg wound. Still a moderate amount of drainage, when she leaves here the wraps only last for 4 days. She has to change the stocking on the left leg every night 05/15/16; she is  now using compression stockings  bilaterally provided by Dr. Marla Roe. She is wearing a nonadherent layer over the wounds so really I don't think there is anything specific being done to this now. She has some reduction on the left wound. The right is stable. I think all healing here is being done without a specific dressing 06/09/16; patient arrives here today with not much change in the wound certainly in diameter to large circular wounds over the medial aspect of her ankle bilaterally. Under the light of these services are certainly not viable for healing. There is no evidence of surrounding infection. She is wearing compression stockings with some sort of silver impregnation as prescribed by Dr. Marla Roe. She has a follow-up with her tomorrow. 06/30/16; no major change in the size or condition of her wounds. These are still probably covered with a nonviable surface. She is using only her purchase stockings. She did see Dr. Marla Roe who seemed to want to apply Dakin's solution to this I'm not extreme short what value this would be. I would suggest Iodoflex which she still has at home. 07/28/16; I follow Mrs. Kobel episodically along with Dr. Marla Roe. 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 She has been applying a topical collagen ointment to both wounds with Adaptic. I don't think Dr. Marla Roe is planning to take her back to the OR. 08/19/16; I follow Mrs. Jeneen Rinks on a monthly basis along with Dr. Marla Roe of plastic surgery. 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 I been following her for a number of years. At one point I was able to get the right medial malleolus wound to heal and had the left medial malleolus down to about half its current size however and I had to send her to plastic surgery for an operative debridement. Since then things have been stable to slightly improve the area  on the right is slightly better one in the left about the same although there is much less adherent surface than I'm used to with this patient. She is using some form of liquid collagen gel that Dr. Marla Roe provided a Kerlix cover with the patient's own pressure stockings. She tells me that she has extreme pain in both ankles and along the lateral aspect of both feet. She has been unable to work for some period of time. She is telling me she is retiring at the beginning of April. She sees Dr. Doran Durand of orthopedics next week 09/22/16; patient has not seen Dr. Marla Roe since the last time she is here. I'm not really sure what she is using to the wounds other than bits and pieces of think she had left over including most recently Hydrofera Blue. She is using juxtalite stockings. She is having difficulty with her husband's recent illness "stroke". She is having to transport him to various doctors appointments. Dr. Marla Roe left her the option of a repeat debridement with ACEL however she has not been able to get the time to follow-up on this. She continues to have a fair amount of drainage out of these wounds with certainly precludes leaving dressings on all week 10/13/16; patient has not seen Dr. Marla Roe since she was last in our clinic. I'm not really sure what she is doing with the wounds, we did try to get her [facility] and I think she is actually using this most of the time. Because of drainage she states she  has to change this every second day although this is an improvement from what she used to do. She went to see Dr. Doran Durand who did not think she had a muscular issue with regards to her feet, he referred her to a neurologist and I think the appointment is sometime in June. I changed her back to Iodoflex which she has used in the past but not recently. 11/03/16; the patient has been using Iodoflex although she ran out of this. Still claims that there is a lot of drainage although the wound  does not look like this. No surrounding erythema. She has not been back to see Dr. Marla Roe 11/24/16; the patient has been using Iodoflex again but she ran out of it 2 or 3 days ago. There is no major change in the condition of either one of these wounds in fact they are larger and covered in a thick adherent surface slough/nonviable tissue especially on the left. She does not tolerate mechanical debridement in our clinic. Going back to see Dr. Marla Roe of plastic surgery for an operative debridement would seem reasonable. 12/15/16; the patient has not been back to see Dr. Marla Roe. She is been dealing with a series of illnesses and her husband which of monopolized her time. She is been using Sorbact which we largely supplied. She states the drainage is bad enough that it maximum she can go 2-3 days without changing the dressing 01/12/2017 -- the patient has not been back for about 4 weeks and has not seen Dr. Marla Roe not does she have any appointment pending. 01/23/17; patient has not seen Dr. Marla Roe even though I suggested this previously. She is using Santyl that was suggested last week by Dr. Con Memos this Cost her $16 through her insurance which is indeed surprising 02/12/17; continuing Santyl and the patient is changing this daily. A lot of drainage. She has not been back to see plastic surgery she is using an Ace wrap. Our intake nurse suggested wrap around stockings which would make a good reasonable alternative 02/26/17; patient is been using Santyl and changing this daily due to drainage. She has not been to see plastic surgery she uses in April Ace wrap to control the edema. She did obtain extremitease stockings but stated that the edema in her leg was to big for these 03/20/17; patient is using Santyl and Anasept. Surfaces looked better today the area on the right is actually measuring a little smaller. She has states she has a lot of pain in her feet and ankles and is asking for a  consult to pain control which I'll try to help her with through our case manager. 04/10/17; the patient arrives with better-looking wound surfaces and is slightly smaller wound on the left she is using a combination of Santyl and Anasept. She has an appointment or at least as started in the pain control center associated with [facility] 05/14/17; this is a patient who I followed for a prolonged period of time. 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 At one point I had this down to a much smaller wound on the left however these reopened and we've never been able to get these to heal. She has been using Santyl and Anasept gel although 2 weeks ago she ran out of the Anasept gel. She has a stable appearance of the wound. She is going to the wound care clinic at [facility]. They wanted do a nerve block/spinal block although she tells me she is  reluctant to go forward with that. 05/21/17; this is a patient I have followed for many years. 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 Chronic pain and deformity in her ankles as well. She is been to see plastic surgery as well as orthopedics. Using PolyMem AG most recently/Kerramax/ABDs and 2 layer compression. She has managed to keep this on and she is coming in for a nurse check to change the dressing on Tuesdays, we see her on Fridays 06/05/17; really quite a good looking surface and the area especially on the right medial has contracted in terms of dimensions. Well granulated healthy-looking tissue on both sides. Even with an open curet there is nothing that even feels abnormal here. This is as good as I've seen this in quite some time. We have been using PolyMem AG and bringing her in for a nurse check 06/12/17; really quite good surface on both of these wounds. The right medial has contracted a bit left is not. We've been using PolyMem and AG and she is coming in for a nurse visit 06/19/17; we  have been using PolyMem AG and bringing her in for a nurse check. Dimensions of her wounds are not better but the surfaces looked better bilaterally. She complained of bleeding last night and the left wound and increasing pain bilaterally. She states her wound pain is more neuropathic than just the wounds. There was some suggestion that this was radicular from her pain management doctor in talking to her it is really difficult to sort this out. 06/26/17; using PolyMem and AG and bringing her in for a nurse check as All of this and reasonably stable condition. Certainly not improved. The dimensions on the lateral part of the right leg look better but not really measuring better. The medial aspect on the left is about the same. 07/03/16; we have been using PolyMen AG and bringing her in for a nurse check to change the dressings as the wounds have drainage which precludes once weekly changing. We are using all secondary absorptive dressings.our intake nurse is brought up the idea of using a wound VAC/snap VAC on the wound to help with the drainage to see if this would result in some contraction. This is not a bad idea. The area on the right medial is actually looking smaller. Both wounds have a reasonable-looking surface. There is no evidence of cellulitis. The edema is well controlled 07/10/17; the patient was denied for a snap VAC by her insurance. The major issue with these wounds continues to be drainage. We are using wicked PolyMem AG and she is coming in for a nurse visit to change this. The wounds are stable to slightly improved. The surface looks vibrant and the area on the right certainly has shrunk in size but very slowly 07/17/17; the patient still has large wounds on her bilateral medial malleoli. Surface of both of these wounds looks better. The dimensions seem to come and go but no consistent improvement. There is no epithelialization. We do not have options for advanced treatment products due to  insurance issues. They did not approve of the wound VAC to help control the drainage. More recently we've been using PolyMem and AG wicked to allow drainage through. We have been bringing her in for a nurse visit to change this. We do not have a lot of options for wound care products and the home again due to insurance issues 07/24/17; the patient's wound actually looks somewhat better today. No drainage measurements  are smaller still healthy-looking surface. We used silver collagen under PolyMen started last week. We have been bringing her in for a dressing change 07/31/17; patient's wound surface continued to look better and I think there is visible change in the dimensions of the wound on the right. Rims of epithelialization. We have been using silver collagen under PolyMen and bringing her in for a dressing change. There appears to be less drainage although she is still in need of the dressing change 08/07/17. Patient's wound surface continues to look better on both sides and the area on the right is definitely smaller. We have been using silver collagen and PolyMen. She feels that the drainage has been it has been better. I asked her about her vascular status. She went to see Dr. Aleda Grana at [facility] and had some form of ablation. I don't have much detail on this. I haven't my notes from 2016 that she was not a candidate for any further ablation but I don't have any more information on this. We had referred her to vein and vascular I don't think she ever went. He does not have a history of PAD although I don't have any information on this either. We don't even have ABIs in our record 08/14/17; we've been using silver collagen and PolyMen cover. And putting the patient and compression. She we are bringing her in as a nurse visit to change this because ofarge amount of drainage. We didn't the ABIs in clinic today since they had been done in many moons 1.2 bilaterally. She has been to see  vein and vascular however this was at [facility] and she had ablation although I really don't have any information on this all seemed biking get a report. She is also been operatively debrided by plastic surgery and had a cell placed probably 8-12 months ago. This didn't have a major effect. We've been making some gains with current dressings 08/19/17-She is here in follow-up evaluation for bilateral medial malleoli ulcers. She continues to tolerate debridement very poorly. We will continue with recently changed topical treatment; if no significant improvement may consider switching to Iodosorb/Iodoflex. 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 She has been using silver collagen and PolyMem. I believe she has been used and tried on Iodoflex before. 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 These have been very refractory over time. We have been using silver collagen and PolyMen. She is coming in once a week for a doctor's and once a week for nurse visits. We are actually making some progress 09/18/17; the patient's wounds are smaller especially on the right medial. She arrives today to upset to consider even washing these off with Anasept which I think is been part of the reason this is been closing. We've been using collagen covered in PolyMen otherwise. It is noted that she has a small area of folliculitis on the right medial calf that. As we are wrapping her legs I'll give her a short course of doxycycline to make sure this doesn't amount to anything. She is a long list of complaints today including imbalance, shortness of breath on exertion, inversion of her left ankle. With regards to the latter complaints she is been to see orthopedics and they offered  her a tendon release surgery  I believe but wanted her wounds to be closed first. I have recommended she go see her primary physician with regards to everything else. 09/25/17; patient's wounds are about the same size. We have made some progress bilaterally although not in recent weeks. She will not allow me T wash these wounds with Anasept even if she is doing her cell. Wheeze we've been using collagen covered in PolyMen. Last week she had a small area of folliculitis this is now opened into a small wound. She completed 5 days of trimethoprim sulfamethoxazole 10/02/17; unfortunately the area on her left medial ankle is worse with a larger wound area towards the Achilles. The patient complains of a lot of pain. She will not allow debridement although visually I don't think there is anything to debridement in any case. We have been using silver collagen and PolyMen for several months now. Initially we are making some progress although I'm not really seeing that today. We will move back to [facility]. His admittedly this is a bit of a repeat however I'm hoping that his situation is different now. The patient tells me she had her leg on the left give out on her yesterday this is process some pain. 10/09/17; the patient is seen twice a week largely because of drainage issues coming out of the chronic medial bimalleolar wounds that are chronic. Last week the dimensions of the one on the left looks a little larger I changed her to [facility]. She comes in today with a history of terrible pain in the bilateral wound areas. She will not allow debridement. She will not even allow a tissue culture. There is no surrounding erythema no no evidence of cellulitis. We have been putting her Kerlix Coban man. She will not allow more aggressive compression as there was a suggestion to put her in 3 layer wraps. 10/16/17; large wounds on her bilateral medial malleoli. These are chronic. Not much change from last week.  The surface looks have healthy but absolutely no epithelialization. A lot of pain little less so of drainage. She will not allow debridement or even washing these off in the vigorous fashion with Anasept. 10/23/17; large wounds on her bilateral malleoli which are chronic. Some improvement in terms of size perhaps on the right since last time I saw these. She states that after we increased the 3 layer compression there was some bleeding, when she came in for a nurse visit she did not want 3 layer compression put back on about our nurse managed to convince her. She has known chronic venous visit issues and I'm hoping to get her to tolerate the 3 layer compression. using Hydrofera Blue 10/30/17; absolutely no change in the condition of either wound although we've had some improvement in dimensions on the right.. Attempted to put her in 3 layer compression she didn't tolerated she is back in 2 layer compression. We've been using Hydrofera Blue We looked over her past records. She had venous reflux studies in November 2016. There was no evidence of deep venous reflux on the right. Superficial vein did not show the greater saphenous vein at think this is been previously ablated the small saphenous vein was within normal limits. The left deep venous system showed no DVT the vessels were positive for deep venous reflux in the posterior tibial veins at the ankle. The greater saphenous vein was surgically absent small saphenous vein was within normal limits. She went to vein and vascular at [facility]. I believe she had  an ablation on the left greater saphenous vein. I'll update her reflux studies perhaps ever reviewed by vein and vascular. We've made absolutely no progress in these wounds. Will also try to read and TheraSkins through her insurance 11/06/17; W the patient apparently has a 2 week follow-up with vein and vascular I like him to review the whole issue with regards to her previous vascular workup by  Dr. Aleda Grana. We've really made no progress on these wounds in many months. She arrives today with less viable looking surface on the left medial ankle wound. This was apparently looking about the same on Tuesday when she was here for nurse visit. 11/13/17; deep tissue culture I did last time of the left lower leg showed multiple organisms without any predominating. In particular no Staphylococcus or group A strep were isolated. We sent her for venous reflux studies. She's had a previous left greater saphenous vein stripping and I think sclerotherapy of the right greater saphenous vein. She didn't really look at the lesser saphenous vein this both wounds are on the medial aspect. She has reflux in the common femoral vein and popliteal vein and an accessory vein on the right and the common femoral vein and popliteal vein on the left. I'm going to have her go to see vein and vascular just the look over things and see if anything else beside aggressive compression is indicated here. We have not been able to make any progress on these wounds in spite of the fact that the surface of the wounds is never look too bad. 11/20/17; no major change in the condition of the wounds. Patient reports a large amount of drainage. She has a lot of complaints of pain although enlisting her today I wonder if some of this at least his neuropathic rather than secondary to her wounds. She has an appointment with vein and vascular on 12/30/17. The refractory nature of these wounds in my mind at least need vein and vascular to look over the wounds the recent reflux studies we did and her history to see if anything further can be done here. I also note her gait is deteriorated quite a bit. 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 She has a bilateral Trendelenburg gait. I wonder if this is neuropathic or perhaps multilevel radicular. 11/27/17; her wounds actually looks slightly better. Healthy-looking granulation  tissue a scant amount of epithelialization. [place] [employer] will not pay for [employer]. They will play for tri layer Oasis and Dermagraft. This is not a diabetic ulcer. We'll try for the tri layer Oasis. She still complains of some drainage. She has a vein and vascular appointment on 12/30/17 12/04/17; the wounds visually look quite good. Healthy-looking granulation with some degree of epithelialization. We are still waiting for response to our request for trial to try layer Oasis. Her appointment with vascular to review venous and arterial issues isn't sold the end of July 7/31. Not allow debridement or even vigorous cleansing of the wound surface. 12/18/17; slightly smaller especially on the right. 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 We've been using Hydrofera Blue. We still are looking into triple layer Oasis through her insurance 01/08/18 on evaluation today patient's wound actually appears to be showing signs of good improvement at this point in time. She has been tolerating the dressing changes without complication. Fortunately there does not appear to be any evidence of infection at this point in time. We have been utilizing silver nitrate which does seem  to be of benefit for her which is also good news. Overall I'm very happy with how things seem to be both regards appearance as well as measurement. Patient did see Dr. Bridgett Larsson for evaluation on 12/30/17. In his assessment he felt that stripping would not likely add much more than chronic compression to the patient's healing process. His recommendation was to follow-up in three months with Dr. Doren Custard if she hasn't healed in order to consider referral back to you and see vascular where she previously was in a trial and was able to get her wound to heal. I'll be see what she feels she when you staying compression and he reiterated this as well. 01/13/18 on evaluation today patient appears to actually be doing very well in  regard to her bilateral medial malleolus ulcers. She seems to have tolerated the chemical cauterization with silver nitrate last week she did have some pain through that evening but fortunately states that I'll be see since it seems to be doing better she is overall pleased with the progress. 01/21/18; really quite a remarkable improvement since I've last seen these wounds. We started using silver nitrate specially on the islands of hyper granulation which for some reason her around the wound circumference. This is really done quite nicely. Primary dressing Hydrofera Blue under 4 layer compression. She seems to be able to hold out without a nurse rewrap. Follow-up in 1 week 01/28/18; we've continued the hydrofera blue but continued with chemical cauterization to the wound area that we started about a month ago for irregular hyper granulation. She is made almost stunning improvement in the overall wound dimensions. I was not really expecting this degree of improvement in these chronic wounds 02/05/18; we continue with Hydrofera Bluebut of also continued the aggressive chemical cauterization with silver nitrate. We made nice progress with the right greater than left wound. 02/12/18. We continued with Hydrofera Blue after aggressive chemical cauterization with silver nitrate. We appear to be making nice progress with both wound areas 02/19/2018; we continue with [facility] after washing the wounds vigorously with Anasept spray and chemical cauterization with silver nitrate. We are making excellent progress. The area on the right's just about closed 02/26/2018. The area on the left medial ankle had too much necrotic debris today. I used a #5 curette we are able to get most of the soft. I continued with the silver nitrate to the much smaller wound on the right medial ankle she had a new area on her right lower pretibial area which she says was due to a role in her compression 03/05/2018; both wound areas  look healthy. Not much change in dimensions from last week. I continue to use silver nitrate and Hydrofera Blue. The patient saw Dr. Doren Custard of vein and vascular. He felt she had venous stasis ulcers. He felt based on her previous arteriogram she should have adequate circulation for healing. Also she has deep venous reflux but really no significant correctable superficial venous reflux at this time. He felt we should continue with conservative management including leg elevation and compression 04/02/2018; since we last saw this woman about a month ago she had a fall apparently suffered a pelvic fracture. I did not look up the x-ray. Nevertheless because of pain she literally was bedbound for 2 weeks and had home health coming out to change the dressing. Somewhat predictably this is resulted in considerable improvement in both wound areas. The right is just about closed on the medial malleolus and the left  is about half the size. 04/16/2018; both her wounds continue to go down in size. Using Hydrofera Blue. 05/07/18; both her wounds appeared to be improving especially on the right where it is almost closed. We are using Hydrofera Blue 05/14/2018; slightly worse this week with larger wounds. Surface on the left medial not quite as good. We have been using Hydrofera Blue 05/21/18; again the wounds are slightly larger. Left medial malleolus slightly larger with eschar around the circumference. We have been using Hydrofera Blue undergoing a wraps for a prolonged period of time. This got a lot better when she was more recumbent due to a fall and a back injury. I change the primary dressing the silver alginate today. She did not tolerate a 4 layer compression previously although I may need to bring this up with her next time 05/28/2018; area on the left medial malleolus again is slightly larger with more drainage. Area on the right is roughly unchanged. She has a small area of folliculitis on the right medial  just on the lower calf. This does not look ominous. 06/03/2018 left medial malleolus slightly smaller in a better looking surface. We used silver nitrate on this last time with silver alginate. The area on the right appears slightly smaller 1/10; left medial malleolus slightly smaller. Small open area on the right. We used silver nitrate and silver alginate as of 2 weeks ago. We continue with the wound and compression. These got a lot better when she was off her feet 1/17; right medial malleolus wound is smaller. The left may be slightly smaller. Both surfaces look somewhat better. 1/24; both wounds are slightly smaller. Using silver alginate under Unna boots 1/31; both wounds appear smaller in fact the area on the right medial is just about closed. Surface eschar. We have been using silver alginate under Unna boots. The patient is less active now spends let much less time on her feet and I think this is contributed to the general improvement in the wound condition 2/7; both wounds appear smaller. I was hopeful the right medial would be closed however there there is still the same small open area. Slight amount of surface eschar on the left the dimensions are smaller there is eschar but the wound edges appear to be free. We have been using silver alginate under Unna boot's 2/14; both wounds once again measure smaller. Circumferential eschar on the left medial. We have been using silver alginate under Unna boots with gradual improvement 2/21; the area on the right medial malleolus has healed. The area on the left is smaller. We have been using silver alginate and Unna boots. We can discharge wrapping the right leg she has 20/30 stockings at home she will need to protect the scar tissue in this area 2/28; the area on the right medial malleolus remains closed the patient has a compression stocking. The area on the left is smaller. We have been using silver alginate and Unna boots. 3/6 the area on the  right medial ankle remains closed. Good edema control noted she is using her own compression stocking. The area on the left medial ankle is smaller. We have been managing this with silver alginate and Unna boots which we will continue today. 3/13; the area on the right medial ankle remains closed and I'm declaring it healed today. When necessary the left is about the same still a healthy-looking surface but no major change and wound area. No evidence of infection and using silver alginate under unna and  generally making considerable improvement 3/27 the area on the right medial ankle remains closed the area on the left is about the same as last week. Certainly not any worse we have been using silver alginate under an Unna boot 4/3; the area on the right medial ankle remains closed per the patient. We did not look at this wound. The wound on the left medial ankle is about the same surface looks healthy we have been using silver alginate under an Unna boot 4/10; area on the right medial ankle remains closed per the patient. We did not look at this wound. The wound on the left medial ankle is slightly larger. The patient complains that the [facility] caused burning pain all week. She also told us that she was a lot more active this week. Changed her back to silver alginate 4/17; right medial ankle still closed per the patient. Left medial ankle is slightly larger. Using silver alginate. She did not tolerate Hydrofera Blue on this area 4/24; right medial ankle remains closed we have not look at this. The left medial ankle continues to get larger today by about a centimeter. We have been using silver alginate under Unna boots. She complains about 4 layer compression as an alternative. She has been up on her feet working on her garden 5/8; right medial ankle remains closed we did not look at this. The left medial ankle has increased in size about 100%. We have been using silver alginate under Unna  boots. She noted increased pain this week and was not surprised that the wound is deteriorated 5/15; no major change in SA however much less erythema ( one week of doxy ocellulitis). 5/22-64 year old female returns at 1 week to the clinic for left medial ankle wound for which we have been using silver alginate under 3 layer compression She was placed on DOXY at last visit - the wound is wider at this visit. She is in 3 layer compression 5/29; change to [facility] last week. I had given her empiric doxycycline 2 weeks ago for a week. She is in 3 layer compression. She complains of a lot of pain and drainage on presentation today. 6/5; using Hydrofera Blue. I gave her doxycycline recently empirically for erythema and pain around the wound. Believe her cultures showed enterococcus which not would not have been well covered by doxycycline nevertheless the wound looks better and I don't feel specifically that the enterococcus needs to be covered. She has a new what looks like a wrap injury on her lateral left ankle. 6/12; she is using Hydrofera Blue. She has a new area on the left anterior lower tibial area. This was a wrap injury last week. 6/19; the patient is using Hydrofera Blue. She arrived with marked inflammation and erythema around the wound and tenderness. 12/01/18 on evaluation today patient appears to be doing a little bit better based on what I'm hearing from the standpoint of lassos evaluation to this as far as the overall appearance of the wound is concerned. Then sometime substandard she typically sees Dr. Dellia Nims. Nonetheless overall very pleased with the progress that she's made up to this point. No fevers, chills, nausea, or vomiting noted at this time. 7/10; some improvement in the surface area. Aggressively debrided last week apparently. I went ahead with the debridement today although the patient does not tolerate this very well. We have been using Iodoflex. Still a fair amount of  drainage 7/17; slightly smaller. Using Iodoflex. 7/24; no change from last  week in terms of surface area. We have been using Iodoflex. Surface looks and continues to look somewhat better 7/31; surface area slightly smaller better looking surface. We have been using Iodoflex. This is under Unna boot compression 8/7-Patient presents at 1 week with Unna boot and Iodoflex, wound appears better 8/14-Patient presents at 1 week with Iodoflex, we use the Unna boot, wound appears to be stable better.Patient is getting Botox treatment for the inversion of the foot for tendon release, Next week 8/21; we are using Iodoflex. Unna boot. The wound is stable in terms of surface area. Under illumination there is some areas of the wound that appear to be either epithelialized or perhaps this is adherent slough at this point I was not really clear. It did not wipe off and I was reluctant to debride this today. 8/28; we are using Iodoflex in an Unna boot. Seems to be making good improvement. 9/4; using Iodoflex and wound is slightly smaller. 9/18; we are using Iodoflex with topical silver nitrate when she is here. The wound continues to be smaller 10/2; patient missed her appointment last week due to GI issues. She left and Iodoflex based dressing on for 2 weeks. Wound is about the same size about the size of a dime on the left medial lower 10/9 we have been using Iodoflex on the medial left ankle wound. She has a new superficial probable wrap injury on the dorsal left ankle 10/16; we have been using Hydrofera Blue since last week. This is on the left medial ankle 10/23; we have been using Hydrofera Blue since 2 weeks ago. This is on the left medial ankle. Dimensions are better 11/6; using Hydrofera Blue. I think the wound is smaller but still not closed. Left medial ankle 11/13; we have been using Hydrofera Blue. Wound is certainly no smaller this week. Also the surface not as good. This is the remanent of a very  large area on her left medial ankle. 11/20; using Sorbact since last week. Wound was about the same in terms of size although I was disappointed about the surface debris 12/11; 3-week follow-up. Patient was on vacation. Wound is measuring slightly larger we have been using Sorbact. 12/18; wound is about the same size however surface looks better last week after debridement. We have been using Sorbact under compression 1/15 wound is probably twice the size of last time increased in length nonviable surface. We have been using Sorbact. She was running a mild fever and missed her appointment last week 1/22; the wound is come down in size but under illumination still a very adherent debris we have been Hydrofera Blue that I changed her to last week 1/29; dimensions down slightly. We have been using Hydrofera Blue 2/19 dimensions are the same however there is rims of epithelialization under illumination. Therefore more the surface area may be epithelialized 2/26; the patient's wound actually measures smaller. The wound looks healthy. We have been using Hydrofera Blue. I had some thoughts about running Apligraf then I still may do that however this looks so much better this week we will delay that for now 3/5; the wound is small but about the same as last week. We have been using Hydrofera Blue. No debridement is required today. 3/19; the wound is about the size of a dime. Healthy looking wound even under illumination. We have been using Hydrofera Blue. No mechanical debridement is necessary 3/26; not much change from last week although still looks very healthy. We have been using  Hydrofera Blue under Unna boots Patient was offered an ankle fusion by podiatry but not until the wound heals with a proceed with this. 4/9; the patient comes in today with her original wound on the medial ankle looking satisfactory however she has some uncontrolled swelling in the middle part of her leg with 2 new open areas  superiorly just lateral to the tibia. I think this was probably a wrap issue. She said she felt uncomfortable during the week but did not call in. We have been using Hydrofera Blue 4/16; the wound on the medial ankle is about the same. She has innumerable small areas superior to this across her mid tibia. I think this is probably folliculitis. She is also been working in the yard doing a lot of sweating 4/30; the patient issue on the upper areas across her mid tibia of all healed. I think this was excessive yard work if I remember. Her wound on the medial ankle is smaller. Some debris on this we have been using Hydrofera Blue under Unna boots 5/7; mid tibia. She has been using Hydrofera Blue under an Unna wrap. She is apparently going for her ankle surgery on June 3 10/28/19-Patient returns to clinic with the ankle wound, we are using Hydrofera Blue under Unna wrap, surgery is scheduled for her left foot for June 3 so she will be back for nurse visit next week READMISSION 01/17/2020 Mrs. Spath is a 64 year old woman we have had in this clinic for a long period of time with severe venous hypertension and refractory wounds on her medial lower legs and ankles bilaterally. 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 When she was off her legs for a prolonged period example when she fell and suffered a compression fracture things would heal up quite nicely. She is now retired and we managed to heal up the right medial leg wound. The left one was very tiny last time I saw this although still refractory. 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 It got to the point where this was interfering with her walking and she elected to proceed with a ankle arthrodesis to straighten her her ankle and leave her  with a functional outcome for mobilization. The patient was referred to Dr. Doren Custard and really this took some time to arrange. Dr. Doren Custard saw her on 12/07/2019. Once again he verified that she had no arterial issues. She had previously had an angiogram several years ago. Follow-up ABIs on the left showed an ABI of 1.12 with triphasic waveforms and a TBI of 0.92. She is felt to have chronic deep venous insufficiency but I do not think it was felt that anything could be done from about this from an ablation point of view. At the time Dr. Doren Custard saw this patient the wounds actually look closed via the pictures in his clinic. The patient finally underwent her surgery on 12/15/2019. This went reasonably well and there was a good anatomic outcome. She developed a small distal wound dehiscence on the lateral part of the surgical wound. However more problematically she is developed recurrence of the wound on the medial left ankle. There are actually 2 wounds here one in the distal lower leg and 1 pretty much at the level of the medial malleolus. It is a  more distal area that is more problematic. She has been using Hydrofera Blue which started on Friday before this she was simply Ace wrapping. There was a culture done that showed Pseudomonas and she is on ciprofloxacin. A recent CNS on 8/11 was negative. The patient reports some pain but I generally think this is improving. She is using a cam boot completely nonweightbearing using a walker for pivot transfers and a wheelchair 8/24; not much improvement unfortunately she has a surgical wound on the lateral part in the venous insufficiency wound medially. The bottom part of the medial insufficiency wound is still necrotic there is exposed tendon here. We have been using Hydrofera Blue under compression. Her edema control is however better 8/31; patient in for follow-up of his surgical wound on the lateral part of her left leg and chronic venous insufficiency ulcers  medially. We put her back in compression last week. She comes in today with a complaint of 3 or 4 days worth of increasing pain. She felt her cam walker was rubbing on the area on the back of her heel. However there is intense erythema seems more likely she has cellulitis. She had 2 cultures done when she was seeing podiatry in the postop. One of them in late July showed Pseudomonas and she received a course of ciprofloxacin the other was negative on 8/11 she is allergic to penicillin with anaphylactoid complaints of hives oral swelling via information in epic 9/9; when I saw this patient last week she had intense anterior erythema around her wound on the right lateral heel and ankle and also into the right medial heel. Some of this was no doubt drainage and her walker boot however I was convinced she had cellulitis. I gave her Levaquin and Bactrim she is finishing up on this now. She is following up with Dr. [name] he saw her yesterday. He is taken her out of the walking boot of course she is still nonweightbearing. Her x-ray was negative for any worrisome features such as soft tissue air etc. Things are a lot better this week. She has home health. We have been using Hydrofera Blue under an [employer] which she put back on yesterday. I did not wrap her last week 9/17; her surrounding skin looks a lot better. In fact the area on the left lateral ankle has just a scant amount of eschar. The only remaining wound is the large area on the left medial ankle. Probably about 60% of this is healthy granulation at the surface however she has a significant divot distally. This has adherent debris in it. I been using debridement and silver collagen to try and get this area to fill-in although I do not think we have made much progress this week Objective Constitutional Sitting or standing Blood Pressure is within target range for patient.. Pulse regular and within target range for patient.Marland Kitchen Respirations regular,  non-labored and within target range.. Temperature is normal and within the target range for the patient.Marland Kitchen Appears in no distress. Vitals Time Taken: 10:01 AM, Height: 68 in, Temperature: 97.8 F, Pulse: 64 bpm, Respiratory Rate: 18 breaths/min, Blood Pressure: 114/65 mmHg. General Notes: Wound exam; left medial ankle. This is a difficult wound because there is a superficial 60% of this but a divot distally. The the divot itself has necrotic debris on the surface which is difficult to remove. I used a #5 curette to try and remove some of this she does not tolerate this very well. There is no surrounding erythema.  She has never had an arterial issue. Her swelling is well controlled ooOn the left lateral ankle a small amount of eschar however this is looks like it is closed Integumentary (Hair, Skin) Wound #15 status is Open. Original cause of wound was Gradually Appeared. The wound is located on the Left,Medial Malleolus. The wound measures 6.5cm length x 3.1cm width x 1.1cm depth; 15.826cm^2 area and 17.408cm^3 volume. There is tendon and Fat Layer (Subcutaneous Tissue) exposed. There is no tunneling or undermining noted. There is a medium amount of serosanguineous drainage noted. The wound margin is flat and intact. There is large (67-100%) red granulation within the wound bed. There is a small (1-33%) amount of necrotic tissue within the wound bed including Adherent Slough. Assessment Active Problems ICD-10 Chronic venous hypertension (idiopathic) with ulcer and inflammation of left lower extremity Non-pressure chronic ulcer of other part of left lower leg with other specified severity Cellulitis of left lower limb Non-pressure chronic ulcer of left ankle with other specified severity Disruption of external operation (surgical) wound, not elsewhere classified, subsequent encounter Procedures Wound #15 Pre-procedure diagnosis of Wound #15 is a Venous Leg Ulcer located on the Left,Medial  Malleolus .Severity of Tissue Pre Debridement is: Fat layer exposed. There was a Excisional Skin/Subcutaneous Tissue Debridement with a total area of 20.15 sq cm performed by [name]., MD. With the following instrument(s): Curette to remove Viable and Non-Viable tissue/material. Material removed includes Subcutaneous Tissue after achieving pain control using Other (benzocaine, 20%). No specimens were taken. A time out was conducted at 10:44, prior to the start of the procedure. A Minimum amount of bleeding was controlled with Pressure. The procedure was tolerated well with a pain level of 2 throughout and a pain level of 0 following the procedure. Post Debridement Measurements: 6.5cm length x 3.1cm width x 1.1cm depth; 17.408cm^3 volume. Character of Wound/Ulcer Post Debridement is improved. Severity of Tissue Post Debridement is: Fat layer exposed. Post procedure Diagnosis Wound #15: Same as Pre-Procedure Pre-procedure diagnosis of Wound #15 is a Venous Leg Ulcer located on the Left,Medial Malleolus . There was a Haematologist Compression Therapy Procedure by Deon Pilling, RN. Post procedure Diagnosis Wound #15: Same as Pre-Procedure Plan Follow-up Appointments: Return Appointment in 1 week. Dressing Change Frequency: Other: - home health to change dressing twice a week. Skin Barriers/Peri-Wound Care: Moisturizing lotion Wound Cleansing: May shower with protection. - use cast protector Primary Wound Dressing: Wound #15 Left,Medial Malleolus: Silver Collagen - moisten with hydrogel. Secondary Dressing: Wound #15 Left,Medial Malleolus: Dry Gauze ABD pad Edema Control: Unna Boot to Left Lower Extremity Avoid standing for long periods of time Elevate legs to the level of the heart or above for 30 minutes daily and/or when sitting, a frequency of: - throughout the day Off-Loading: Other: - ensure no pressure to wound areas. Home Health: [facility] for  wound care. - [facility] to change twice a week. 1. I continued with the silver collagen this week 2. Further mechanical debridements on the divot area is likely to be necessary 3. May need to change the primary dressing to something to better address this. 4. The other issue here is the amount of drainage which she says soaks through the superficial dressings by 2 or 3 days. She has home health changing this twice a week and were changing it once. This would make any thought of an advanced treatment option like Apligraf difficult even if we could get the surface viable. Electronic Signature(s)  Signed: 02/20/2020 8:08:43 AM By: Linton Ham MD Entered By: Linton Ham on 02/17/2020 11:07:36 -------------------------------------------------------------------------------- SuperBill Details Patient Name: Date of Service: [name], [name] 02/17/2020 Medical Record Number: [id number] Patient Account Number: [id number] Date of Birth/Sex: Treating RN: 03/09/1956 (64 y.o. [name] Primary Care Provider: [name] Other Clinician: Referring Provider: Treating Provider/Extender: [name] in Treatment: 4 Diagnosis Coding ICD-10 Codes Code Description [phone] Chronic venous hypertension (idiopathic) with ulcer and inflammation of left lower extremity L97.828 Non-pressure chronic ulcer of other part of left lower leg with other specified severity L03.116 Cellulitis of left lower limb L97.328 Non-pressure chronic ulcer of left ankle with other specified severity T81.31XD Disruption of external operation (surgical) wound, not elsewhere classified, subsequent encounter Facility Procedures CPT4 Code: 57972820 Description: 60156 - DEB SUBQ TISSUE 20 SQ CM/< ICD-10 Diagnosis Description L97.328 Non-pressure chronic ulcer of left ankle with other specified severity Modifier: Quantity: 1 CPT4 Code: [id number] Description: 76147 - DEB SUBQ  TISS EA ADDL 20CM ICD-10 Diagnosis Description L97.328 Non-pressure chronic ulcer of left ankle with other specified severity Modifier: Quantity: 1 Physician Procedures : CPT4 Code Description Modifier [id number] - WC PHYS SUBQ TISS 20 SQ CM ICD-10 Diagnosis Description L97.328 Non-pressure chronic ulcer of left ankle with other specified severity Quantity: 1 Electronic Signature(s) Signed: 02/20/2020 8:08:43 AM By: Linton Ham MD Entered By: Linton Ham on 02/17/2020 11:08:48

## 2020-02-20 NOTE — Progress Notes (Signed)
LAKEVA, HOLLON ([id number]) Visit Report for 02/17/2020 Arrival Information Details Patient Name: Date of Service: [facility], [name] 02/17/2020 10:00 A M Medical Record Number: [id number] Patient Account Number: [id number] Date of Birth/Sex: Treating RN: 05-Jun-1955 (64 y.o. [name] Primary Care Cadyn Fann: [name] Other Clinician: Referring Yanet Balliet: Treating Erique Kaser/Extender: [name] in Treatment: 4 Visit Information History Since Last Visit All ordered tests and consults were completed: No Patient Arrived: [name] Added or deleted any medications: No Arrival Time: 09:59 Any new allergies or adverse reactions: No Accompanied By: self Had a fall or experienced change in No Transfer Assistance: None activities of daily living that may affect Patient Identification Verified: Yes risk of falls: Secondary Verification Process Completed: Yes Signs or symptoms of abuse/neglect since last visito No Patient Has Alerts: Yes Hospitalized since last visit: No Patient Alerts: L ABI =1.12, TBI = .92 Implantable device outside of the clinic excluding No R ABI= 1.02, TBI= .58 cellular tissue based products placed in the center since last visit: Has Dressing in Place as Prescribed: Yes Has Compression in Place as Prescribed: Yes Pain Present Now: No Electronic Signature(s) Signed: 02/20/2020 1:15:48 PM By: [name] RN Entered By: [name] on 02/17/2020 10:01:31 -------------------------------------------------------------------------------- Compression Therapy Details Patient Name: Date of Service: [name], [name] 02/17/2020 10:00 A M Medical Record Number: [id number] Patient Account Number: [id number] Date of Birth/Sex: Treating RN: [birth year] (64 y.o. [name] Primary Care Dody Smartt: [name] Other Clinician: Referring Yiselle Babich: Treating Taneya Conkel/Extender: [name] in Treatment:  4 Compression Therapy Performed for Wound Assessment: Wound #15 Left,Medial Malleolus Performed By: Clinician Deon Pilling, RN Compression Type: Rolena Infante Post Procedure Diagnosis Same as Pre-procedure Electronic Signature(s) Signed: 02/20/2020 1:33:30 PM By: Kela Millin Entered By: Kela Millin on 02/17/2020 10:43:39 -------------------------------------------------------------------------------- Encounter Discharge Information Details Patient Name: Date of Service: [name], [name] 02/17/2020 10:00 A M Medical Record Number: [id number] Patient Account Number: [id number] Date of Birth/Sex: Treating RN: [birth year] (64 y.o. [name] Primary Care Emberlie Gotcher: [name] Other Clinician: Referring Iliyana Convey: Treating Deona Novitski/Extender: [name] in Treatment: 4 Encounter Discharge Information Items Post Procedure Vitals Discharge Condition: Stable Temperature (F): 97.8 Ambulatory Status: Walker Pulse (bpm): 64 Discharge Destination: Home Respiratory Rate (breaths/min): 18 Transportation: Private Auto Blood Pressure (mmHg): 114/65 Accompanied By: self Schedule Follow-up Appointment: Yes Clinical Summary of Care: Electronic Signature(s) Signed: 02/17/2020 5:49:45 PM By: Deon Pilling Entered By: Deon Pilling on 02/17/2020 11:41:50 -------------------------------------------------------------------------------- Lower Extremity Assessment Details Patient Name: Date of Service: [facility] [name], [name] 02/17/2020 10:00 A M Medical Record Number: [id number] Patient Account Number: [id number] Date of Birth/Sex: Treating RN: Aug 21, 1955 (64 y.o. [name] Primary Care Audra Bellard: [name] Other Clinician: Referring Addalyne Vandehei: Treating Jozette Castrellon/Extender: [name] in Treatment: 4 Edema Assessment Assessed: [Left: No] [Right: No] Edema: [Left: Ye] [Right: s] Calf Left: Right: Point of Measurement:  cm From Medial Instep 29 cm cm Ankle Left: Right: Point of Measurement: cm From Medial Instep 20 cm cm Electronic Signature(s) Signed: 02/20/2020 1:15:48 PM By: [name] RN Entered By: [name] on 02/17/2020 10:05:59 -------------------------------------------------------------------------------- Multi Wound Chart Details Patient Name: Date of Service: [name], [name] 02/17/2020 10:00 A M Medical Record Number: [id number] Patient Account Number: [id number] Date of Birth/Sex: Treating RN: May 18, 1956 (64 y.o. [name] Primary Care Seanpaul Preece: [name] Other Clinician: Referring Zander Ingham: Treating Herta Hink/Extender: Abelina Bachelor,  Noelle Weeks in Treatment: 4 Vital Signs Height(in): 68 Pulse(bpm): 36 Weight(lbs): Blood Pressure(mmHg): 114/65 Body Mass Index(BMI): Temperature(F): 97.8 Respiratory Rate(breaths/min): 18 Photos: [15:No Photos Left, Medial Malleolus] [N/A:N/A N/A] Wound Location: [15:Gradually Appeared] [N/A:N/A] Wounding Event: [15:Venous Leg Ulcer] [N/A:N/A] Primary Etiology: [15:Peripheral Venous Disease,] [N/A:N/A] Comorbid History: [15:Osteoarthritis, Confinement Anxiety 12/30/2019] [N/A:N/A] Date Acquired: [15:4] [N/A:N/A] Weeks of Treatment: [15:Open] [N/A:N/A] Wound Status: [15:6.5x3.1x1.1] [N/A:N/A] Measurements L x W x D (cm) [15:15.826] [N/A:N/A] A (cm) : rea [15:17.408] [N/A:N/A] Volume (cm) : [15:-35.70%] [N/A:N/A] % Reduction in A [15:rea: -86.60%] [N/A:N/A] % Reduction in Volume: [15:Full Thickness With Exposed Support N/A] Classification: [15:Structures Medium] [N/A:N/A] Exudate A mount: [15:Serosanguineous] [N/A:N/A] Exudate Type: [15:red, brown] [N/A:N/A] Exudate Color: [15:Flat and Intact] [N/A:N/A] Wound Margin: [15:Large (67-100%)] [N/A:N/A] Granulation A mount: [15:Red] [N/A:N/A] Granulation Quality: [15:Small (1-33%)] [N/A:N/A] Necrotic A mount: [15:Fat Layer (Subcutaneous Tissue): Yes  N/A] Exposed Structures: [15:Tendon: Yes Fascia: No Muscle: No Joint: No Bone: No Small (1-33%)] [N/A:N/A] Epithelialization: [15:Debridement - Excisional] [N/A:N/A] Debridement: Pre-procedure Verification/Time Out 10:44 [N/A:N/A] Taken: [15:Other] [N/A:N/A] Pain Control: [15:Subcutaneous] [N/A:N/A] Tissue Debrided: [15:Skin/Subcutaneous Tissue] [N/A:N/A] Level: [15:20.15] [N/A:N/A] Debridement A (sq cm): [15:rea Curette] [N/A:N/A] Instrument: [15:Minimum] [N/A:N/A] Bleeding: [15:Pressure] [N/A:N/A] Hemostasis A chieved: [15:2] [N/A:N/A] Procedural Pain: [15:0] [N/A:N/A] Post Procedural Pain: [15:Procedure was tolerated well] [N/A:N/A] Debridement Treatment Response: [15:6.5x3.1x1.1] [N/A:N/A] Post Debridement Measurements L x W x D (cm) [15:17.408] [N/A:N/A] Post Debridement Volume: (cm) [15:Compression Therapy] [N/A:N/A] Procedures Performed: [15:Debridement] Treatment Notes Electronic Signature(s) Signed: 02/20/2020 8:08:43 AM By: Linton Ham MD Signed: 02/20/2020 1:33:30 PM By: Kela Millin Entered By: Linton Ham on 02/17/2020 11:02:21 -------------------------------------------------------------------------------- Multi-Disciplinary Care Plan Details Patient Name: Date of Service: [name], [name] 02/17/2020 10:00 A M Medical Record Number: [id number] Patient Account Number: [id number] Date of Birth/Sex: Treating RN: 1955/10/02 (64 y.o. [name] Primary Care Annaleese Guier: [name] Other Clinician: Referring Giara Mcgaughey: Treating Quinton Voth/Extender: [name] in Treatment: 4 Active Inactive Abuse / Safety / Falls / Self Care Management Nursing Diagnoses: Potential for falls Potential for injury related to falls Goals: Patient will not experience any injury related to falls Date Initiated: 01/17/2020 Target Resolution Date: 03/09/2020 Goal Status: Active Patient/caregiver will verbalize/demonstrate measures taken to  prevent injury and/or falls Date Initiated: 01/17/2020 Target Resolution Date: 03/09/2020 Goal Status: Active Interventions: Assess Activities of Daily Living upon admission and as needed Assess fall risk on admission and as needed Assess: immobility, friction, shearing, incontinence upon admission and as needed Assess impairment of mobility on admission and as needed per policy Assess personal safety and home safety (as indicated) on admission and as needed Assess self care needs on admission and as needed Provide education on fall prevention Provide education on personal and home safety Notes: Wound/Skin Impairment Nursing Diagnoses: Impaired tissue integrity Knowledge deficit related to ulceration/compromised skin integrity Goals: Patient/caregiver will verbalize understanding of skin care regimen Date Initiated: 01/17/2020 Target Resolution Date: 03/09/2020 Goal Status: Active Ulcer/skin breakdown will have a volume reduction of 30% by week 4 Date Initiated: 01/17/2020 Target Resolution Date: 03/09/2020 Goal Status: Active Interventions: Assess patient/caregiver ability to obtain necessary supplies Assess patient/caregiver ability to perform ulcer/skin care regimen upon admission and as needed Assess ulceration(s) every visit Provide education on ulcer and skin care Notes: Electronic Signature(s) Signed: 02/20/2020 1:33:30 PM By: Kela Millin Entered By: Kela Millin on 02/17/2020 10:18:28 -------------------------------------------------------------------------------- Pain Assessment Details Patient Name: Date of Service: [name], [name] 02/17/2020 10:00 A M Medical Record Number: [id number] Patient Account Number: [id number]  Date of Birth/Sex: Treating RN: Sep 17, 1955 (64 y.o. [name] Primary Care Cleophas Yoak: [name] Other Clinician: Referring Jericca Russett: Treating Ceazia Harb/Extender: [name] in Treatment: 4 Active  Problems Location of Pain Severity and Description of Pain Patient Has Paino Yes Site Locations With Dressing Change: Yes Duration of the Pain. Constant / Intermittento Constant Rate the pain. Current Pain Level: 4 Worst Pain Level: 10 Least Pain Level: 4 Tolerable Pain Level: 5 Character of Pain Describe the Pain: Aching, Burning, Throbbing Pain Management and Medication Current Pain Management: Medication: Yes Cold Application: No Rest: Yes Massage: No Activity: No T.E.N.S.: No Heat Application: No Leg drop or elevation: No Is the Current Pain Management Adequate: Inadequate How does your wound impact your activities of daily livingo Sleep: Yes Bathing: No Appetite: No Relationship With Others: No Bladder Continence: No Emotions: No Bowel Continence: No Work: No Toileting: No Drive: No Dressing: No Hobbies: No Electronic Signature(s) Signed: 02/20/2020 1:15:48 PM By: [name] RN Entered By: [name] on 02/17/2020 10:05:02 -------------------------------------------------------------------------------- Patient/Caregiver Education Details Patient Name: Date of Service: [name], [name] 9/17/2021andnbsp10:00 A M Medical Record Number: [id number] Patient Account Number: [id number] Date of Birth/Gender: Treating RN: 09-09-1955 (64 y.o. [name] Primary Care Physician: [name] Other Clinician: Referring Physician: Treating Physician/Extender: [name] in Treatment: 4 Education Assessment Education Provided To: Patient Education Topics Provided Safety: Handouts: Personal Safety Methods: Explain/Verbal Responses: State content correctly Wound/Skin Impairment: Handouts: Caring for Your Ulcer Methods: Explain/Verbal Responses: State content correctly Electronic Signature(s) Signed: 02/20/2020 1:33:30 PM By: Kela Millin Entered By: Kela Millin on 02/17/2020  10:18:49 -------------------------------------------------------------------------------- Wound Assessment Details Patient Name: Date of Service: [name], [name] 02/17/2020 10:00 A M Medical Record Number: [id number] Patient Account Number: [id number] Date of Birth/Sex: Treating RN: [birth year] (64 y.o. [name] Primary Care Casie Sturgeon: [name] Other Clinician: Referring Raylei Losurdo: Treating Shanecia Hoganson/Extender: [name] in Treatment: 4 Wound Status Wound Number: 15 Primary Venous Leg Ulcer Etiology: Wound Location: Left, Medial Malleolus Wound Status: Open Wounding Event: Gradually Appeared Comorbid Peripheral Venous Disease, Osteoarthritis, Confinement Date Acquired: 12/30/2019 History: Anxiety Weeks Of Treatment: 4 Clustered Wound: No Wound Measurements Length: (cm) 6.5 Width: (cm) 3.1 Depth: (cm) 1.1 Area: (cm) 15.826 Volume: (cm) 17.408 % Reduction in Area: -35.7% % Reduction in Volume: -86.6% Epithelialization: Small (1-33%) Tunneling: No Undermining: No Wound Description Classification: Full Thickness With Exposed Support Structures Wound Margin: Flat and Intact Exudate Amount: Medium Exudate Type: Serosanguineous Exudate Color: red, brown Foul Odor After Cleansing: No Slough/Fibrino Yes Wound Bed Granulation Amount: Large (67-100%) Exposed Structure Granulation Quality: Red Fascia Exposed: No Necrotic Amount: Small (1-33%) Fat Layer (Subcutaneous Tissue) Exposed: Yes Necrotic Quality: Adherent Slough Tendon Exposed: Yes Muscle Exposed: No Joint Exposed: No Bone Exposed: No Treatment Notes Wound #15 (Left, Medial Malleolus) 1. Cleanse With Wound Cleanser Soap and water 2. Periwound Care Barrier cream Moisturizing lotion 3. Primary Dressing Applied Collegen AG Hydrogel or K-Y Jelly 4. Secondary Dressing ABD Pad Dry Gauze 6. Support Layer Best boy) Signed: 02/20/2020  1:15:48 PM By: [name] RN Entered By: [name] on 02/17/2020 10:08:17 -------------------------------------------------------------------------------- Vitals Details Patient Name: Date of Service: [name], [name] 02/17/2020 10:00 A M Medical Record Number: [id number] Patient Account Number: [id number] Date of Birth/Sex: Treating RN: 12/28/1955 (64 y.o. [name] Primary Care Clemmie Buelna: [name] Other Clinician: Referring Kalden Wanke: Treating Lakisha Peyser/Extender: [name]  in Treatment: 4 Vital Signs Time Taken: 10:01 Temperature (F): 97.8 Height (in): 68 Pulse (bpm): 64 Respiratory Rate (breaths/min): 18 Blood Pressure (mmHg): 114/65 Reference Range: 80 - 120 mg / dl Electronic Signature(s) Signed: 02/20/2020 1:15:48 PM By: [name] RN Entered By: [name] on 02/17/2020 10:02:47

## 2020-02-24 ENCOUNTER — Other Ambulatory Visit: Payer: Self-pay

## 2020-02-24 ENCOUNTER — Encounter (HOSPITAL_BASED_OUTPATIENT_CLINIC_OR_DEPARTMENT_OTHER): Payer: Medicare Other | Admitting: Internal Medicine

## 2020-02-24 DIAGNOSIS — L97328 Non-pressure chronic ulcer of left ankle with other specified severity: Secondary | ICD-10-CM | POA: Diagnosis not present

## 2020-02-26 NOTE — Progress Notes (Signed)
WHISPER, KURKA ([id number]) Visit Report for 02/24/2020 Debridement Details Patient Name: Date of Service: [facility] [name], [name] 02/24/2020 10:00 A M Medical Record Number: [id number] Patient Account Number: [id number] Date of Birth/Sex: Treating RN: Jun 25, 1955 (64 y.o. Clearnce Sorrellearnce Sorrel Primary Care Provider: [name] Other Clinician: Referring Provider: Treating Provider/Extender: [name] in Treatment: 5 Debridement Performed for Assessment: Wound #15 Left,Medial Malleolus Performed By: Physician [name]., MD Debridement Type: Debridement Severity of Tissue Pre Debridement: Fat layer exposed Level of Consciousness (Pre-procedure): Awake and Alert Pre-procedure Verification/Time Out Yes - 10:45 Taken: Start Time: 10:45 Pain Control: Other : benzocaine, 20% T Area Debrided (L x W): otal 1 (cm) x 1 (cm) = 1 (cm) Tissue and other material debrided: Subcutaneous Level: Skin/Subcutaneous Tissue Debridement Description: Excisional Instrument: Curette Bleeding: Minimum Hemostasis Achieved: Pressure End Time: 10:45 Procedural Pain: 0 Post Procedural Pain: 0 Response to Treatment: Procedure was tolerated well Level of Consciousness (Post- Awake and Alert procedure): Post Debridement Measurements of Total Wound Length: (cm) 6.1 Width: (cm) 2.9 Depth: (cm) 1.2 Volume: (cm) 16.672 Character of Wound/Ulcer Post Debridement: Improved Severity of Tissue Post Debridement: Fat layer exposed Post Procedure Diagnosis Same as Pre-procedure Electronic Signature(s) Signed: 02/24/2020 5:26:06 PM By: Kela Millin Signed: 02/26/2020 6:30:39 AM By: Linton Ham MD Entered By: Kela Millin on 02/24/2020 10:46:20 -------------------------------------------------------------------------------- HPI Details Patient Name: Date of Service: [name], [name] 02/24/2020 10:00 A M Medical Record Number: [id number] Patient Account Number:  [id number] Date of Birth/Sex: Treating RN: June 09, 1955 (64 y.o. [name] Primary Care Provider: [name] Other Clinician: Referring Provider: Treating Provider/Extender: [name] in Treatment: 5 History of Present Illness HPI Description: the remaining wound is over the left medial ankle. Similar wound over the right medial ankle healed largely with use of Apligraf. Most recently we have been using Hydrofera Blue over this wound with considerable improvement. The patient has been extensively worked up in the past for her venous insufficiency and she is not a candidate for antireflux surgery although I have none of the details available currently. 08/24/14; considerable improvement today. About 50% of this wound areas now epithelialized. The base of the wound appears to be healthier granulation.as opposed to last week when she had deteriorated a considerable improvement 08/17/14; unfortunately the wound has regressed somewhat. The areas of epithelialization from the superior aspect are not nearly as healthy as they were last week. The patient thinks her Hydrofera Blue slipped. 09/07/14; unfortunately the area has markedly regressed in the 2 weeks since I've seen this. There is an odor surrounding erythema. The healthy granulation tissue that we had at the base of the wound now is a dusky color. The nurse reports green drainage 09/14/14; the area looks somewhat better than last week. There is less erythema and less drainage. The culture I did did not show any growth. Nevertheless I think it is better to continue the Cipro and doxycycline for a further week. The remaining wound area was debridement. 09/21/14. Wound did not require debridement last week. Still less erythema and less drainage. She can complete her antibiotics. The areas of epithelialization in the superior aspect of the wound do not look as healthy as they did some weeks ago 10/05/14 continued  improvement in the condition of this wound. There is advancing epithelialization. Less aggressive debridement required 10/19/14 continued improvement in the condition and volume of this wound. Less aggressive debridement to the inferior part of this to  remove surface slough and fibrinous eschar 11/02/14 no debridement is required. The surface granulation appears healthy although some of her islands of epithelialization seem to have regressed. No evidence of infection 11/16/14; lites surface debridement done of surface eschar. The wound does not look to be unhealthy. No evidence of infection. Unfortunately the patient has had podiatry issues in the right foot and for some reason has redeveloped small surface ulcerations in the medial right ankle. Her original presentation involved wounds in this area 11/23/14 no debridement. The area on the right ankle has enlarged. The left ankle wound appears stable in terms of the surface although there is periwound inflammation. There has been regression in the amount of new skin 11/30/14 no debridement. Both wound areas appear healthy. There was no evidence of infection. The the new area on the right medial ankle has enlarged although that both the surfaces appear to be stable. 12/07/14; Debridement of the right medial ankle wound. No no debridement was done on the left. 12/14/14 no major change in and now bilateral medial ankle wounds. Both of these are very painful but the no overt evidence of infection. She has had previous venous ablation 12/21/14; patient states that her right medial ankle wound is considerably more painful last week than usual. Her left is also somewhat painful. She could not tolerate debridement. The right medial ankle wound has fibrinous surface eschar 12/28/14 this is a patient with severe bilateral venous insufficiency ulcers. For a considerable period of time we actually had the one on the right medial ankle healed however this recently opened  up again in June. The left medial ankle wound has been a refractory area with some absent flows. We had some success with Hydrofera Blue on this area and it literally closed by 50% however it is recently opened up Foley. Both of these were debridement today of surface eschar. She tolerates this poorly 01/25/15: No change in the status of this. Thick adherent escar. Very poor tolerance of any attempt at debridement. I had healed the right medial malleolus wound for a considerable amount of time and had the left one down to about 50% of the volume although this is totally regressed over the last 48 weeks. Further the right leg has reopened. she is trying to make a appointment with pain and vascular, previous ablations with Dr. Aleda Grana. I do not believe there is an arterial insufficiency issue here 02/01/15 the status of the adherent eschar bilaterally is actually improved. No debridement was done. She did not manage to get vascular studies done 02/08/15 continued debridement of the area was done today. The slough is less adherent and comes off with less pressure. There is no surrounding infection peripheral pulses are intact 02/15/15 selective debridement with a disposable curette. Again the slough is less adherent and comes off with less difficulty. No surrounding infection peripheral pulses are intact. 02/22/15 selective debridement of the right medial ankle wound. Slough comes off with less difficulty. No obvious surrounding infection peripheral pulses are intact I did not debridement the one on the left. Both of these are stable to improved 03/01/15 selective debridement of both wound areas using a curette to. Adherent slough cup soft with less difficulty. No obvious surrounding infection. The patient tells me that 2 days ago she noted a rash above the right leg wrap. She did not have this on her lower legs when she change this over she arrives with widespread left greater than right almost  folliculitis-looking rash which is extremely pruritic.  I don't see anything to culture here. There is no rash on the rest of her body. She feels well systemically. 03/08/15; selective debridement of both wounds using a curette. Base of this does not look unhealthy. She had limegreen drainage coming out of the left leg wound and describes a lot of drainage. The rash on her left leg looks improved to. No cultures were done. 03/22/15; patient was not here last week. Basal wounds does not look healthy and there is no surrounding erythema. No drainage. There is still a rash on the left leg that almost looks vasculitic however it is clearly limited to the top of where the wrap would be. 04/05/15; on the right required a surgical debridement of surface eschar and necrotic subcutaneous tissue. I did not debridement the area on the left. These continue to be large open wounds that are not changing that much. We were successful at one point in healing the area on the right, and at the same time the area on the left was roughly half the size of current measurements. I think a lot of the deterioration has to do with the prolonged time the patient is on her feet at work 04/19/15 I attempted-like surface debridement bilaterally she does not tolerate this. She tells me that she was in allergic care yesterday with extreme pain over her left lateral malleolus/ankle and was told that she has an "sprain" 05/03/15; large bilateral venous insufficiency wounds over the medial malleolus/medial aspect of her ankles. She complains of copious amounts of drainage and his usual large amounts of pain. There is some increasing erythema around the wound on the right extending into the medial aspect of her foot to. historically she came in with these wounds the right one healed and the left one came down to roughly half its current size however the right one is reopened and the left is expanded. 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 05/10/15 large bilateral venous insufficiency wounds. There is less adherence surface left however the surface culture that I did last week grew pseudomonas therefore bilateral selective debridement score necessary. There is surrounding erythema. The patient describes severe bilateral drainage and a lot of pain in the left ankle. Apparently her podiatrist was were ready to do a cortisone shot 05/17/15; the patient complains of pain and again copious amounts of drainage. 05/24/15; we used Iodo flex last week. Patient notes considerable improvement in wound drainage. Only needed to change this once. 05/31/15; we continued Iodoflex; the base of these large wounds bilaterally is not too bad but there is probably likely a significant bioburden here. I would like to debridement just doesn't tolerate it. 06/06/14 I would like to continue the Iodoflex although she still hasn't managed to obtain supplies. She has bilateral medial malleoli or large wounds which are mostly superficial. Both of them are covered circumferentially with some nonviable fibrinous slough although she tolerates debridement very poorly. She apparently has an appointment for an ablation on the right leg by interventional radiology. 06/14/15; the patient arrives with the wounds and static condition. We attempted a debridement although she does not do well with this secondary to pain. I 07/05/15; wounds are not much smaller however there appears to be a cleaner granulating base. The left has tight fibrinous slough greater than the right. Debridement is tolerated poorly due to pain. Iodoflex is done more for these wounds in any of the multitude  of different dressings I have tried on the left 1 and then subsequently the right. 07/12/15; no change in the condition of this wound. I am able to do an aggressive debridement on the right but not the left. She simply cannot tolerate it. We have been  using Iodoflex which helps somewhat. It is worthwhile remembering that at one point we healed the right medial ankle wound and the left was about 25% of the current circumference. We have suggested returning to vascular surgery for review of possible further ablations for one reason or another she has not been able to do this. 07/26/15 no major change in the condition of either wound on her medial ankle. I did not attempt to debridement of these. She has been aggressively scrubbing these while she is in the shower at home. She has her supply of Iodoflex which seems to have done more for these wounds then anything I have put on recently. 08/09/15 wound area appears larger although not verified by measurements. Using Iodoflex 09/05/2015 -- she was here for avisit today but had significant problems with the wound and I was asked to see her for a physician opinion. I have summarize that this lady has had surgery on her left lower extremity about 10 years ago where the possible veins stripping was done. She has had an opinion from interventional radiology around November 2016 where no further sclerotherapy was ordered. The patient works 12 hours a day and stands on a concrete floor with work boots and is unable to get the proper compression she requires and cannot elevate her limbs appropriately at any given time. She has recently grown Pseudomonas from her wound culture but has not started her ciprofloxacin which was called in for her. 09/13/15 this continues to be a difficult situation for this patient. At one point I had this wound down to a 1.5 x 1.5" wound on her left leg. This is deteriorated and the right leg has reopened. She now has substantial wounds on her medial calcaneus, malleoli and into her lower leg. One on the left has surface eschar but these are far too painful for me to debridement here. She has a vascular surgery appointment next week to see if anything can be done to help here. I  think she has had previous ablations several years ago at [facility]. She has no major edema. She tells me that she did not get product last time [facility] Ag] and went for several days without it. She continues to work in work boots 12 hours a day. She cannot get compression/4-layer under her work boots. 09/20/15 no major change. Periwound edema control was not very good. Her point with pain and vascular is next Wednesday the 25th 09/28/15; the patient is seen vascular surgery and is apparently scheduled for repeat duplex ultrasounds of her bilateral lower legs next week. 10/05/15; the patient was seen by Dr. Doren Custard of vascular surgery. He feels that she should have arterial insufficiency excluded as cause/contributed to her nonhealing stage she is therefore booked for an arteriogram. She has apparently monophasic signals in the dorsalis pedis pulses. She also of course has known severe chronic venous insufficiency with previous procedures as noted previously. I had another long discussion with the patient today about her continuing to work 12 hour shifts. I've written her out for 2 months area had concerns about this as her work location is currently undergoing significant turmoil and this may lead to her termination. She is aware of this however  I agree with her that she simply cannot continue to stand for 12 hours multiple days a week with the substantial wound areas she has. 10/19/15; the Dr. Doren Custard appointment was largely for an arteriogram which was normal. She does not have an arterial issue. He didn't make a comment about her chronic venous insufficiency for which she has had previous ablations. Presumably it was not felt that anything additional could be done. The patient is now out of work as I prescribed 2 weeks ago. Her wounds look somewhat less aggravated presumably because of this. I felt I would give debridement another try today 10/25/15; no major change in this patient's wounds. We are  struggling to get her product that she can afford into her own home through her insurance. 11/01/15; no major change in the patient's wounds. I have been using silver alginate as the most affordable product. I spoke to Dr. Marla Roe last week with her requested take her to the OR for surgical debridement and placement of ACEL. Dr. Marla Roe told me that she would be willing to do this however [facility] will not cover this, fortunately the patient has [place] healthcare of some variant 11/08/15; no major change in the patient's wounds. She has been completely nonviable surface that this but is in too much pain with any attempted debridement are clinic. I have arranged for her to see Dr. Marla Roe ham of plastic surgery and this appointment is on Monday. I am hopeful that they will take her to the OR for debridement, possible ACEL ultimately possible skin graft 11/22/15 no major change in the patient's wounds over her bilateral medial calcaneus medial malleolus into the lower legs. Surface on these does not look too bad however on the left there is surrounding erythema and tenderness. This may be cellulitis or could him sleepy tinea. 11/29/15; no major changes in the patient's wounds over her bilateral medial malleolus. There is no infection here and I don't think any additional antibiotics are necessary. There is now plan to move forward. She sees Dr. Marla Roe in a week's time for preparation for operative debridement and ACEL placement I believe on 7/12. She then has a follow-up appointment with Dr. Marla Roe on 7/21 12/28/15; the patient returns today having been taken to the [facility] by Dr. Marla Roe 12/12/15 she underwent debridement, intraoperative cultures [which were negative]. She had placement of a wound VAC. Parent really ACEL was not available to be placed. The wound VAC foam apparently adhered to the wound since then she's been using silver alginate, Xeroform under Ace wraps. She still  says there is a lot of drainage and a lot of pain 01/31/16; this is a patient I see monthly. I had referred her to Dr. Marla Roe him of plastic surgery for large wounds on her bilateral medial ankles. She has been to the OR twice once in early July and once in early August. She tells me over the last 3 weeks she has been using the wound VAC with ACEL underneath it. On the right we've simply been using silver alginate. Under Kerlix Coban wraps. 02/28/16; this is a patient I'm currently seeing monthly. She is gone on to have a skin graft over her large venous insufficiency ulcer on the left medial ankle. This was done by Dr. Marla Roe him. The patient is a bit perturbed about why she didn't have one on her right medial ankle wound. She has been using silver alginate to this. 03/06/16; I received a phone call from her plastic  surgery Dr. Marla Roe. She expressed some concern about the viability of the skin graft she did on the left medial ankle wound. Asked me to place Endoform on this. She told me she is not planning to do a subsequent skin graft on the right as the left one did not take very well. I had placed Hydrofera Blue on the right 03/13/16; continue to have a reasonably healthy wound on the right medial ankle. Down to 3 mm in terms of size. There is epithelialization here. The area on the left medial ankle is her skin graft site. I suppose the last week this looks somewhat better. She has an open area inferiorly however in the center there appears to be some viable tissue. There is a lot of surface callus and eschar that will eventually need to come off however none of this looked to be infected. Patient states that the is able to keep the dressing on for several days which is an improvement. 03/20/16 no major change in the circumference of either wound however on the left side the patient was at Dr. Eusebio Friendly office and they did a debridement of left wound. 50% of the wound seems to be  epithelialized. I been using Endoform on the left Hydrofera Blue in the right 03/27/16; she arrives today with her wound is not looking as healthy as they did last week. The area on the right clearly has an adherent surface to this a very similar surface on the left. Unfortunately for this patient this is all too familiar problem. Clearly the Endoform is not working and will need to change that today that has some potential to help this surface. She does not tolerate debridement in this clinic very well. She is changing the dressing wants 04/03/16; patient arrives with the wounds looking somewhat better especially on the right. Dr. Migdalia Dk change the dressing to silver alginate when she saw her on Monday and also sold her some compression socks. The usefulness of the latter is really not clear and woman with severely draining wounds. 04/10/16; the patient is doing a bit of an experiment wearing the compression stockings that Dr. Migdalia Dk provided her to her left leg and the out of legs based dressings that we provided to the right. 05/01/16; the patient is continuing to wear compression stockings Dr. Migdalia Dk provided her on the left that are apparently silver impregnated. She has been using Iodoflex to the right leg wound. Still a moderate amount of drainage, when she leaves here the wraps only last for 4 days. She has to change the stocking on the left leg every night 05/15/16; she is now using compression stockings bilaterally provided by Dr. Marla Roe. She is wearing a nonadherent layer over the wounds so really I don't think there is anything specific being done to this now. She has some reduction on the left wound. The right is stable. I think all healing here is being done without a specific dressing 06/09/16; patient arrives here today with not much change in the wound certainly in diameter to large circular wounds over the medial aspect of her ankle bilaterally. Under the light of these services are  certainly not viable for healing. There is no evidence of surrounding infection. She is wearing compression stockings with some sort of silver impregnation as prescribed by Dr. Marla Roe. She has a follow-up with her tomorrow. 06/30/16; no major change in the size or condition of her wounds. These are still probably covered with a nonviable surface. She is using only  her purchase stockings. She did see Dr. Marla Roe who seemed to want to apply Dakin's solution to this I'm not extreme short what value this would be. I would suggest Iodoflex which she still has at home. 07/28/16; I follow Mrs. Daddario episodically along with Dr. Marla Roe. 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 She has been applying a topical collagen ointment to both wounds with Adaptic. I don't think Dr. Marla Roe is planning to take her back to the OR. 08/19/16; I follow Mrs. Jeneen Rinks on a monthly basis along with Dr. Marla Roe of plastic surgery. 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 I been following her for a number of years. At one point I was able to get the right medial malleolus wound to heal and had the left medial malleolus down to about half its current size however and I had to send her to plastic surgery for an operative debridement. Since then things have been stable to slightly improve the area on the right is slightly better one in the left about the same although there is much less adherent surface than I'm used to with this patient. She is using some form of liquid collagen gel that Dr. Marla Roe provided a Kerlix cover with the patient's own pressure stockings. She tells me that she has extreme pain in both ankles and along the lateral aspect of both feet. She has been unable to work for some period of time. She is telling me she is retiring at the beginning of April. She sees Dr.  Doran Durand of orthopedics next week 09/22/16; patient has not seen Dr. Marla Roe since the last time she is here. I'm not really sure what she is using to the wounds other than bits and pieces of think she had left over including most recently Hydrofera Blue. She is using juxtalite stockings. She is having difficulty with her husband's recent illness "stroke". She is having to transport him to various doctors appointments. Dr. Marla Roe left her the option of a repeat debridement with ACEL however she has not been able to get the time to follow-up on this. She continues to have a fair amount of drainage out of these wounds with certainly precludes leaving dressings on all week 10/13/16; patient has not seen Dr. Marla Roe since she was last in our clinic. I'm not really sure what she is doing with the wounds, we did try to get her [facility] and I think she is actually using this most of the time. Because of drainage she states she has to change this every second day although this is an improvement from what she used to do. She went to see Dr. Doran Durand who did not think she had a muscular issue with regards to her feet, he referred her to a neurologist and I think the appointment is sometime in June. I changed her back to Iodoflex which she has used in the past but not recently. 11/03/16; the patient has been using Iodoflex although she ran out of this. Still claims that there is a lot of drainage although the wound does not look like this. No surrounding erythema. She has not been back to see Dr. Marla Roe 11/24/16; the patient has been using Iodoflex again but she ran out of it 2 or 3 days ago. There is no major change in the condition of either one of these wounds in fact they are larger and covered  in a thick adherent surface slough/nonviable tissue especially on the left. She does not tolerate mechanical debridement in our clinic. Going back to see Dr. Marla Roe of plastic surgery for an operative  debridement would seem reasonable. 12/15/16; the patient has not been back to see Dr. Marla Roe. She is been dealing with a series of illnesses and her husband which of monopolized her time. She is been using Sorbact which we largely supplied. She states the drainage is bad enough that it maximum she can go 2-3 days without changing the dressing 01/12/2017 -- the patient has not been back for about 4 weeks and has not seen Dr. Marla Roe not does she have any appointment pending. 01/23/17; patient has not seen Dr. Marla Roe even though I suggested this previously. She is using Santyl that was suggested last week by Dr. Con Memos this Cost her $16 through her insurance which is indeed surprising 02/12/17; continuing Santyl and the patient is changing this daily. A lot of drainage. She has not been back to see plastic surgery she is using an Ace wrap. Our intake nurse suggested wrap around stockings which would make a good reasonable alternative 02/26/17; patient is been using Santyl and changing this daily due to drainage. She has not been to see plastic surgery she uses in April Ace wrap to control the edema. She did obtain extremitease stockings but stated that the edema in her leg was to big for these 03/20/17; patient is using Santyl and Anasept. Surfaces looked better today the area on the right is actually measuring a little smaller. She has states she has a lot of pain in her feet and ankles and is asking for a consult to pain control which I'll try to help her with through our case manager. 04/10/17; the patient arrives with better-looking wound surfaces and is slightly smaller wound on the left she is using a combination of Santyl and Anasept. She has an appointment or at least as started in the pain control center associated with [facility] 05/14/17; this is a patient who I followed for a prolonged period of time. 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 At one  point I had this down to a much smaller wound on the left however these reopened and we've never been able to get these to heal. She has been using Santyl and Anasept gel although 2 weeks ago she ran out of the Anasept gel. She has a stable appearance of the wound. She is going to the wound care clinic at [facility]. They wanted do a nerve block/spinal block although she tells me she is reluctant to go forward with that. 05/21/17; this is a patient I have followed for many years. 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 Chronic pain and deformity in her ankles as well. She is been to see plastic surgery as well as orthopedics. Using PolyMem AG most recently/Kerramax/ABDs and 2 layer compression. She has managed to keep this on and she is coming in for a nurse check to change the dressing on Tuesdays, we see her on Fridays 06/05/17; really quite a good looking surface and the area especially on the right medial has contracted in terms of dimensions. Well granulated healthy-looking tissue on both sides. Even with an open curet there is nothing that even feels abnormal here. This is as good as I've seen this in quite some time. We have been using PolyMem AG and bringing her in for a nurse check 06/12/17;  really quite good surface on both of these wounds. The right medial has contracted a bit left is not. We've been using PolyMem and AG and she is coming in for a nurse visit 06/19/17; we have been using PolyMem AG and bringing her in for a nurse check. Dimensions of her wounds are not better but the surfaces looked better bilaterally. She complained of bleeding last night and the left wound and increasing pain bilaterally. She states her wound pain is more neuropathic than just the wounds. There was some suggestion that this was radicular from her pain management doctor in talking to her it is really difficult to sort this out. 06/26/17; using PolyMem and AG and bringing her  in for a nurse check as All of this and reasonably stable condition. Certainly not improved. The dimensions on the lateral part of the right leg look better but not really measuring better. The medial aspect on the left is about the same. 07/03/16; we have been using PolyMen AG and bringing her in for a nurse check to change the dressings as the wounds have drainage which precludes once weekly changing. We are using all secondary absorptive dressings.our intake nurse is brought up the idea of using a wound VAC/snap VAC on the wound to help with the drainage to see if this would result in some contraction. This is not a bad idea. The area on the right medial is actually looking smaller. Both wounds have a reasonable-looking surface. There is no evidence of cellulitis. The edema is well controlled 07/10/17; the patient was denied for a snap VAC by her insurance. The major issue with these wounds continues to be drainage. We are using wicked PolyMem AG and she is coming in for a nurse visit to change this. The wounds are stable to slightly improved. The surface looks vibrant and the area on the right certainly has shrunk in size but very slowly 07/17/17; the patient still has large wounds on her bilateral medial malleoli. Surface of both of these wounds looks better. The dimensions seem to come and go but no consistent improvement. There is no epithelialization. We do not have options for advanced treatment products due to insurance issues. They did not approve of the wound VAC to help control the drainage. More recently we've been using PolyMem and AG wicked to allow drainage through. We have been bringing her in for a nurse visit to change this. We do not have a lot of options for wound care products and the home again due to insurance issues 07/24/17; the patient's wound actually looks somewhat better today. No drainage measurements are smaller still healthy-looking surface. We used silver collagen under  PolyMen started last week. We have been bringing her in for a dressing change 07/31/17; patient's wound surface continued to look better and I think there is visible change in the dimensions of the wound on the right. Rims of epithelialization. We have been using silver collagen under PolyMen and bringing her in for a dressing change. There appears to be less drainage although she is still in need of the dressing change 08/07/17. Patient's wound surface continues to look better on both sides and the area on the right is definitely smaller. We have been using silver collagen and PolyMen. She feels that the drainage has been it has been better. I asked her about her vascular status. She went to see Dr. Aleda Grana at [facility] and had some form of ablation. I don't have much detail on this.  I haven't my notes from 2016 that she was not a candidate for any further ablation but I don't have any more information on this. We had referred her to vein and vascular I don't think she ever went. He does not have a history of PAD although I don't have any information on this either. We don't even have ABIs in our record 08/14/17; we've been using silver collagen and PolyMen cover. And putting the patient and compression. She we are bringing her in as a nurse visit to change this because ofarge amount of drainage. We didn't the ABIs in clinic today since they had been done in many moons 1.2 bilaterally. She has been to see vein and vascular however this was at [facility] and she had ablation although I really don't have any information on this all seemed biking get a report. She is also been operatively debrided by plastic surgery and had a cell placed probably 8-12 months ago. This didn't have a major effect. We've been making some gains with current dressings 08/19/17-She is here in follow-up evaluation for bilateral medial malleoli ulcers. She continues to tolerate debridement very poorly. We will continue  with recently changed topical treatment; if no significant improvement may consider switching to Iodosorb/Iodoflex. 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 She has been using silver collagen and PolyMem. I believe she has been used and tried on Iodoflex before. 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 These have been very refractory over time. We have been using silver collagen and PolyMen. She is coming in once a week for a doctor's and once a week for nurse visits. We are actually making some progress 09/18/17; the patient's wounds are smaller especially on the right medial. She arrives today to upset to consider even washing these off with Anasept which I think is been part of the reason this is been closing. We've been using collagen covered in PolyMen otherwise. It is noted that she has a small area of folliculitis on the right medial calf that. As we are wrapping her legs I'll give her a short course of doxycycline to make sure this doesn't amount to anything. She is a long list of complaints today including imbalance, shortness of breath on exertion, inversion of her left ankle. With regards to the latter complaints she is been to see orthopedics and they offered her a tendon release surgery I believe but wanted her wounds to be closed first. I have recommended she go see her primary physician with regards to everything else. 09/25/17; patient's wounds are about the same size. We have made some progress bilaterally although not in recent weeks. She will not allow me T wash these wounds with Anasept even if she is doing her cell. Wheeze we've been using collagen covered in PolyMen. Last week she had a small area of folliculitis this is now opened into a  small wound. She completed 5 days of trimethoprim sulfamethoxazole 10/02/17; unfortunately the area on her left medial ankle is worse with a larger wound area towards the Achilles. The patient complains of a lot of pain. She will not allow debridement although visually I don't think there is anything to debridement in any case. We have been using silver collagen and PolyMen for several months now. Initially  we are making some progress although I'm not really seeing that today. We will move back to [facility]. His admittedly this is a bit of a repeat however I'm hoping that his situation is different now. The patient tells me she had her leg on the left give out on her yesterday this is process some pain. 10/09/17; the patient is seen twice a week largely because of drainage issues coming out of the chronic medial bimalleolar wounds that are chronic. Last week the dimensions of the one on the left looks a little larger I changed her to [facility]. She comes in today with a history of terrible pain in the bilateral wound areas. She will not allow debridement. She will not even allow a tissue culture. There is no surrounding erythema no no evidence of cellulitis. We have been putting her Kerlix Coban man. She will not allow more aggressive compression as there was a suggestion to put her in 3 layer wraps. 10/16/17; large wounds on her bilateral medial malleoli. These are chronic. Not much change from last week. The surface looks have healthy but absolutely no epithelialization. A lot of pain little less so of drainage. She will not allow debridement or even washing these off in the vigorous fashion with Anasept. 10/23/17; large wounds on her bilateral malleoli which are chronic. Some improvement in terms of size perhaps on the right since last time I saw these. She states that after we increased the 3 layer compression there was some bleeding, when she came in for a nurse visit she did not want 3 layer  compression put back on about our nurse managed to convince her. She has known chronic venous visit issues and I'm hoping to get her to tolerate the 3 layer compression. using Hydrofera Blue 10/30/17; absolutely no change in the condition of either wound although we've had some improvement in dimensions on the right.. Attempted to put her in 3 layer compression she didn't tolerated she is back in 2 layer compression. We've been using Hydrofera Blue We looked over her past records. She had venous reflux studies in November 2016. There was no evidence of deep venous reflux on the right. Superficial vein did not show the greater saphenous vein at think this is been previously ablated the small saphenous vein was within normal limits. The left deep venous system showed no DVT the vessels were positive for deep venous reflux in the posterior tibial veins at the ankle. The greater saphenous vein was surgically absent small saphenous vein was within normal limits. She went to vein and vascular at [facility]. I believe she had an ablation on the left greater saphenous vein. I'll update her reflux studies perhaps ever reviewed by vein and vascular. We've made absolutely no progress in these wounds. Will also try to read and TheraSkins through her insurance 11/06/17; W the patient apparently has a 2 week follow-up with vein and vascular I like him to review the whole issue with regards to her previous vascular workup by Dr. Aleda Grana. We've really made no progress on these wounds in many months. She arrives today with less viable looking surface on the left medial ankle wound. This was apparently looking about the same on Tuesday when she was here for nurse visit. 11/13/17; deep tissue culture I did last time of the left lower leg showed multiple organisms without any predominating. In particular no Staphylococcus or group A strep were isolated. We sent her for venous reflux studies. She's had a previous  left greater saphenous vein stripping and I think sclerotherapy of the right greater saphenous vein. She didn't really look at the lesser saphenous vein this both wounds are on the medial aspect. She has reflux in the common femoral vein and popliteal vein and an accessory vein on the right and the common femoral vein and popliteal vein on the left. I'm going to have her go to see vein and vascular just the look over things and see if anything else beside aggressive compression is indicated here. We have not been able to make any progress on these wounds in spite of the fact that the surface of the wounds is never look too bad. 11/20/17; no major change in the condition of the wounds. Patient reports a large amount of drainage. She has a lot of complaints of pain although enlisting her today I wonder if some of this at least his neuropathic rather than secondary to her wounds. She has an appointment with vein and vascular on 12/30/17. The refractory nature of these wounds in my mind at least need vein and vascular to look over the wounds the recent reflux studies we did and her history to see if anything further can be done here. I also note her gait is deteriorated quite a bit. 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 She has a bilateral Trendelenburg gait. I wonder if this is neuropathic or perhaps multilevel radicular. 11/27/17; her wounds actually looks slightly better. Healthy-looking granulation tissue a scant amount of epithelialization. [place] healthcare will not pay for [employer]. They will play for tri layer Oasis and Dermagraft. This is not a diabetic ulcer. We'll try for the tri layer Oasis. She still complains of some drainage. She has a vein and vascular appointment on 12/30/17 12/04/17; the wounds visually look quite good. Healthy-looking granulation with some degree of epithelialization. We are still waiting for response to our request for trial to try layer Oasis. Her  appointment with vascular to review venous and arterial issues isn't sold the end of July 7/31. Not allow debridement or even vigorous cleansing of the wound surface. 12/18/17; slightly smaller especially on the right. 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 We've been using Hydrofera Blue. We still are looking into triple layer Oasis through her insurance 01/08/18 on evaluation today patient's wound actually appears to be showing signs of good improvement at this point in time. She has been tolerating the dressing changes without complication. Fortunately there does not appear to be any evidence of infection at this point in time. We have been utilizing silver nitrate which does seem to be of benefit for her which is also good news. Overall I'm very happy with how things seem to be both regards appearance as well as measurement. Patient did see Dr. Bridgett Larsson for evaluation on 12/30/17. In his assessment he felt that stripping would not likely add much more than chronic compression to the patient's healing process. His recommendation was to follow-up in three months with Dr. Doren Custard if she hasn't healed in order to consider referral back to you and see vascular where she previously was in a trial and was able to get her wound to heal. I'll be see what she feels she when you staying compression and he reiterated this as well. 01/13/18 on evaluation today patient appears to actually be doing very well in regard to her bilateral medial malleolus ulcers. She seems to have tolerated the chemical cauterization with silver nitrate last week she did  have some pain through that evening but fortunately states that I'll be see since it seems to be doing better she is overall pleased with the progress. 01/21/18; really quite a remarkable improvement since I've last seen these wounds. We started using silver nitrate specially on the islands of hyper granulation which for some reason her around  the wound circumference. This is really done quite nicely. Primary dressing Hydrofera Blue under 4 layer compression. She seems to be able to hold out without a nurse rewrap. Follow-up in 1 week 01/28/18; we've continued the hydrofera blue but continued with chemical cauterization to the wound area that we started about a month ago for irregular hyper granulation. She is made almost stunning improvement in the overall wound dimensions. I was not really expecting this degree of improvement in these chronic wounds 02/05/18; we continue with Hydrofera Bluebut of also continued the aggressive chemical cauterization with silver nitrate. We made nice progress with the right greater than left wound. 02/12/18. We continued with Hydrofera Blue after aggressive chemical cauterization with silver nitrate. We appear to be making nice progress with both wound areas 02/19/2018; we continue with [facility] after washing the wounds vigorously with Anasept spray and chemical cauterization with silver nitrate. We are making excellent progress. The area on the right's just about closed 02/26/2018. The area on the left medial ankle had too much necrotic debris today. I used a #5 curette we are able to get most of the soft. I continued with the silver nitrate to the much smaller wound on the right medial ankle she had a new area on her right lower pretibial area which she says was due to a role in her compression 03/05/2018; both wound areas look healthy. Not much change in dimensions from last week. I continue to use silver nitrate and Hydrofera Blue. The patient saw Dr. Doren Custard of vein and vascular. He felt she had venous stasis ulcers. He felt based on her previous arteriogram she should have adequate circulation for healing. Also she has deep venous reflux but really no significant correctable superficial venous reflux at this time. He felt we should continue with conservative management including leg elevation and  compression 04/02/2018; since we last saw this woman about a month ago she had a fall apparently suffered a pelvic fracture. I did not look up the x-ray. Nevertheless because of pain she literally was bedbound for 2 weeks and had home health coming out to change the dressing. Somewhat predictably this is resulted in considerable improvement in both wound areas. The right is just about closed on the medial malleolus and the left is about half the size. 04/16/2018; both her wounds continue to go down in size. Using Hydrofera Blue. 05/07/18; both her wounds appeared to be improving especially on the right where it is almost closed. We are using Hydrofera Blue 05/14/2018; slightly worse this week with larger wounds. Surface on the left medial not quite as good. We have been using Hydrofera Blue 05/21/18; again the wounds are slightly larger. Left medial malleolus slightly larger with eschar around the circumference. We have been using Hydrofera Blue undergoing a wraps for a prolonged period of time. This got a lot better when she was more recumbent due to a fall and a back injury. I change the primary dressing the silver alginate today. She did not tolerate a 4 layer compression previously although I may need to bring this up with her next time 05/28/2018; area on the left medial malleolus again is  slightly larger with more drainage. Area on the right is roughly unchanged. She has a small area of folliculitis on the right medial just on the lower calf. This does not look ominous. 06/03/2018 left medial malleolus slightly smaller in a better looking surface. We used silver nitrate on this last time with silver alginate. The area on the right appears slightly smaller 1/10; left medial malleolus slightly smaller. Small open area on the right. We used silver nitrate and silver alginate as of 2 weeks ago. We continue with the wound and compression. These got a lot better when she was off her feet 1/17; right  medial malleolus wound is smaller. The left may be slightly smaller. Both surfaces look somewhat better. 1/24; both wounds are slightly smaller. Using silver alginate under Unna boots 1/31; both wounds appear smaller in fact the area on the right medial is just about closed. Surface eschar. We have been using silver alginate under Unna boots. The patient is less active now spends let much less time on her feet and I think this is contributed to the general improvement in the wound condition 2/7; both wounds appear smaller. I was hopeful the right medial would be closed however there there is still the same small open area. Slight amount of surface eschar on the left the dimensions are smaller there is eschar but the wound edges appear to be free. We have been using silver alginate under Unna boot's 2/14; both wounds once again measure smaller. Circumferential eschar on the left medial. We have been using silver alginate under Unna boots with gradual improvement 2/21; the area on the right medial malleolus has healed. The area on the left is smaller. We have been using silver alginate and Unna boots. We can discharge wrapping the right leg she has 20/30 stockings at home she will need to protect the scar tissue in this area 2/28; the area on the right medial malleolus remains closed the patient has a compression stocking. The area on the left is smaller. We have been using silver alginate and Unna boots. 3/6 the area on the right medial ankle remains closed. Good edema control noted she is using her own compression stocking. The area on the left medial ankle is smaller. We have been managing this with silver alginate and Unna boots which we will continue today. 3/13; the area on the right medial ankle remains closed and I'm declaring it healed today. When necessary the left is about the same still a healthy-looking surface but no major change and wound area. No evidence of infection and using silver  alginate under unna and generally making considerable improvement 3/27 the area on the right medial ankle remains closed the area on the left is about the same as last week. Certainly not any worse we have been using silver alginate under an Unna boot 4/3; the area on the right medial ankle remains closed per the patient. We did not look at this wound. The wound on the left medial ankle is about the same surface looks healthy we have been using silver alginate under an Unna boot 4/10; area on the right medial ankle remains closed per the patient. We did not look at this wound. The wound on the left medial ankle is slightly larger. The patient complains that the [facility] caused burning pain all week. She also told us that she was a lot more active this week. Changed her back to silver alginate 4/17; right medial ankle still closed per the  patient. Left medial ankle is slightly larger. Using silver alginate. She did not tolerate Hydrofera Blue on this area 4/24; right medial ankle remains closed we have not look at this. The left medial ankle continues to get larger today by about a centimeter. We have been using silver alginate under Unna boots. She complains about 4 layer compression as an alternative. She has been up on her feet working on her garden 5/8; right medial ankle remains closed we did not look at this. The left medial ankle has increased in size about 100%. We have been using silver alginate under Unna boots. She noted increased pain this week and was not surprised that the wound is deteriorated 5/15; no major change in SA however much less erythema ( one week of doxy ocellulitis). 5/22-64 year old female returns at 1 week to the clinic for left medial ankle wound for which we have been using silver alginate under 3 layer compression She was placed on DOXY at last visit - the wound is wider at this visit. She is in 3 layer compression 5/29; change to [facility] last week. I had  given her empiric doxycycline 2 weeks ago for a week. She is in 3 layer compression. She complains of a lot of pain and drainage on presentation today. 6/5; using Hydrofera Blue. I gave her doxycycline recently empirically for erythema and pain around the wound. Believe her cultures showed enterococcus which not would not have been well covered by doxycycline nevertheless the wound looks better and I don't feel specifically that the enterococcus needs to be covered. She has a new what looks like a wrap injury on her lateral left ankle. 6/12; she is using Hydrofera Blue. She has a new area on the left anterior lower tibial area. This was a wrap injury last week. 6/19; the patient is using Hydrofera Blue. She arrived with marked inflammation and erythema around the wound and tenderness. 12/01/18 on evaluation today patient appears to be doing a little bit better based on what I'm hearing from the standpoint of lassos evaluation to this as far as the overall appearance of the wound is concerned. Then sometime substandard she typically sees Dr. Dellia Nims. Nonetheless overall very pleased with the progress that she's made up to this point. No fevers, chills, nausea, or vomiting noted at this time. 7/10; some improvement in the surface area. Aggressively debrided last week apparently. I went ahead with the debridement today although the patient does not tolerate this very well. We have been using Iodoflex. Still a fair amount of drainage 7/17; slightly smaller. Using Iodoflex. 7/24; no change from last week in terms of surface area. We have been using Iodoflex. Surface looks and continues to look somewhat better 7/31; surface area slightly smaller better looking surface. We have been using Iodoflex. This is under Unna boot compression 8/7-Patient presents at 1 week with Unna boot and Iodoflex, wound appears better 8/14-Patient presents at 1 week with Iodoflex, we use the Unna boot, wound appears to be stable  better.Patient is getting Botox treatment for the inversion of the foot for tendon release, Next week 8/21; we are using Iodoflex. Unna boot. The wound is stable in terms of surface area. Under illumination there is some areas of the wound that appear to be either epithelialized or perhaps this is adherent slough at this point I was not really clear. It did not wipe off and I was reluctant to debride this today. 8/28; we are using Iodoflex in an Unna boot. Seems  to be making good improvement. 9/4; using Iodoflex and wound is slightly smaller. 9/18; we are using Iodoflex with topical silver nitrate when she is here. The wound continues to be smaller 10/2; patient missed her appointment last week due to GI issues. She left and Iodoflex based dressing on for 2 weeks. Wound is about the same size about the size of a dime on the left medial lower 10/9 we have been using Iodoflex on the medial left ankle wound. She has a new superficial probable wrap injury on the dorsal left ankle 10/16; we have been using Hydrofera Blue since last week. This is on the left medial ankle 10/23; we have been using Hydrofera Blue since 2 weeks ago. This is on the left medial ankle. Dimensions are better 11/6; using Hydrofera Blue. I think the wound is smaller but still not closed. Left medial ankle 11/13; we have been using Hydrofera Blue. Wound is certainly no smaller this week. Also the surface not as good. This is the remanent of a very large area on her left medial ankle. 11/20; using Sorbact since last week. Wound was about the same in terms of size although I was disappointed about the surface debris 12/11; 3-week follow-up. Patient was on vacation. Wound is measuring slightly larger we have been using Sorbact. 12/18; wound is about the same size however surface looks better last week after debridement. We have been using Sorbact under compression 1/15 wound is probably twice the size of last time increased in length  nonviable surface. We have been using Sorbact. She was running a mild fever and missed her appointment last week 1/22; the wound is come down in size but under illumination still a very adherent debris we have been Hydrofera Blue that I changed her to last week 1/29; dimensions down slightly. We have been using Hydrofera Blue 2/19 dimensions are the same however there is rims of epithelialization under illumination. Therefore more the surface area may be epithelialized 2/26; the patient's wound actually measures smaller. The wound looks healthy. We have been using Hydrofera Blue. I had some thoughts about running Apligraf then I still may do that however this looks so much better this week we will delay that for now 3/5; the wound is small but about the same as last week. We have been using Hydrofera Blue. No debridement is required today. 3/19; the wound is about the size of a dime. Healthy looking wound even under illumination. We have been using Hydrofera Blue. No mechanical debridement is necessary 3/26; not much change from last week although still looks very healthy. We have been using Hydrofera Blue under Unna boots Patient was offered an ankle fusion by podiatry but not until the wound heals with a proceed with this. 4/9; the patient comes in today with her original wound on the medial ankle looking satisfactory however she has some uncontrolled swelling in the middle part of her leg with 2 new open areas superiorly just lateral to the tibia. I think this was probably a wrap issue. She said she felt uncomfortable during the week but did not call in. We have been using Hydrofera Blue 4/16; the wound on the medial ankle is about the same. She has innumerable small areas superior to this across her mid tibia. I think this is probably folliculitis. She is also been working in the yard doing a lot of sweating 4/30; the patient issue on the upper areas across her mid tibia of all healed. I think  this  was excessive yard work if I remember. Her wound on the medial ankle is smaller. Some debris on this we have been using Hydrofera Blue under Unna boots 5/7; mid tibia. She has been using Hydrofera Blue under an Unna wrap. She is apparently going for her ankle surgery on June 3 10/28/19-Patient returns to clinic with the ankle wound, we are using Hydrofera Blue under Unna wrap, surgery is scheduled for her left foot for June 3 so she will be back for nurse visit next week READMISSION 01/17/2020 Mrs. Thomure is a 64 year old woman we have had in this clinic for a long period of time with severe venous hypertension and refractory wounds on her medial lower legs and ankles bilaterally. 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 When she was off her legs for a prolonged period example when she fell and suffered a compression fracture things would heal up quite nicely. She is now retired and we managed to heal up the right medial leg wound. The left one was very tiny last time I saw this although still refractory. 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 It got to the point where this was interfering with her walking and she elected to proceed with a ankle arthrodesis to straighten her her ankle and leave her with a functional outcome for mobilization. The patient was referred to Dr. Doren Custard and really this took some time to arrange. Dr. Doren Custard saw her on 12/07/2019. Once again he verified that she had no arterial issues. She had previously had an angiogram several years ago. Follow-up ABIs on the left showed an ABI of 1.12 with triphasic waveforms and a TBI of 0.92. She is felt to have chronic deep venous insufficiency but I do not think it was felt that anything could be done from about this from  an ablation point of view. At the time Dr. Doren Custard saw this patient the wounds actually look closed via the pictures in his clinic. The patient finally underwent her surgery on 12/15/2019. This went reasonably well and there was a good anatomic outcome. She developed a small distal wound dehiscence on the lateral part of the surgical wound. However more problematically she is developed recurrence of the wound on the medial left ankle. There are actually 2 wounds here one in the distal lower leg and 1 pretty much at the level of the medial malleolus. It is a more distal area that is more problematic. She has been using Hydrofera Blue which started on Friday before this she was simply Ace wrapping. There was a culture done that showed Pseudomonas and she is on ciprofloxacin. A recent CNS on 8/11 was negative. The patient reports some pain but I generally think this is improving. She is using a cam boot completely nonweightbearing using a walker for pivot transfers and a wheelchair 8/24; not much improvement unfortunately she has a surgical wound on the lateral part in the venous insufficiency wound medially. The bottom part of the medial insufficiency wound is still necrotic there is exposed tendon here. We have been using Hydrofera Blue under compression. Her edema control is however better 8/31; patient in for follow-up of his surgical wound on the lateral part of her left leg and chronic venous insufficiency ulcers medially. We put her back in compression  last week. She comes in today with a complaint of 3 or 4 days worth of increasing pain. She felt her cam walker was rubbing on the area on the back of her heel. However there is intense erythema seems more likely she has cellulitis. She had 2 cultures done when she was seeing podiatry in the postop. One of them in late July showed Pseudomonas and she received a course of ciprofloxacin the other was negative on 8/11 she is allergic to penicillin  with anaphylactoid complaints of hives oral swelling via information in epic 9/9; when I saw this patient last week she had intense anterior erythema around her wound on the right lateral heel and ankle and also into the right medial heel. Some of this was no doubt drainage and her walker boot however I was convinced she had cellulitis. I gave her Levaquin and Bactrim she is finishing up on this now. She is following up with Dr. [name] he saw her yesterday. He is taken her out of the walking boot of course she is still nonweightbearing. Her x-ray was negative for any worrisome features such as soft tissue air etc. Things are a lot better this week. She has home health. We have been using Hydrofera Blue under an [employer] which she put back on yesterday. I did not wrap her last week 9/17; her surrounding skin looks a lot better. In fact the area on the left lateral ankle has just a scant amount of eschar. The only remaining wound is the large area on the left medial ankle. Probably about 60% of this is healthy granulation at the surface however she has a significant divot distally. This has adherent debris in it. I been using debridement and silver collagen to try and get this area to fill-in although I do not think we have made much progress this week 9/24; the patient's wound on the left medial ankle looks a lot better. The deeper divot area distally still requires debridement but this is cleaning up quite nicely we have been using silver collagen. The patient is complaining of swelling in her foot and is worried that that is contributing to the nonhealing of the ankle wound. She is also complaining of numbness in her anterior toes Electronic Signature(s) Signed: 02/26/2020 6:30:39 AM By: Linton Ham MD Entered By: Linton Ham on 02/24/2020 11:16:07 -------------------------------------------------------------------------------- Physical Exam Details Patient Name: Date of Service: [name],  [name] 02/24/2020 10:00 A M Medical Record Number: [id number] Patient Account Number: [id number] Date of Birth/Sex: Treating RN: [birth year] (64 y.o. [name] Primary Care Provider: [name] Other Clinician: Referring Provider: Treating Provider/Extender: [name] in Treatment: 5 Constitutional Sitting or standing Blood Pressure is within target range for patient.. Pulse regular and within target range for patient.Marland Kitchen Respirations regular, non-labored and within target range.. Temperature is normal and within the target range for the patient.Marland Kitchen Appears in no distress. Cardiovascular Pedal pulses are palpable. She has some swelling of the left foot but nothing looks infected or ominous. Notes Wound exam; left medial ankle we have much better control of the debris on the distal part of the wound where the deeper aspect is. I am still using a #3 curette over a small remaining surface area which is actually the deepest area of the wound. This does not probe to bone there is no evidence of active infection. Hemostasis with a pressure dressing. Electronic Signature(s) Signed: 02/26/2020 6:30:39 AM By: Linton Ham MD Entered By:  Linton Ham on 02/24/2020 11:18:16 -------------------------------------------------------------------------------- Physician Orders Details Patient Name: Date of Service: [facility] [name], [name] 02/24/2020 10:00 A M Medical Record Number: [id number] Patient Account Number: [id number] Date of Birth/Sex: Treating RN: Feb 07, 1956 (64 y.o. [name] Primary Care Provider: [name] Other Clinician: Referring Provider: Treating Provider/Extender: [name] in Treatment: 5 Verbal / Phone Orders: No Diagnosis Coding ICD-10 Coding Code Description [phone] Chronic venous hypertension (idiopathic) with ulcer and inflammation of left lower extremity L97.828 Non-pressure chronic ulcer  of other part of left lower leg with other specified severity L03.116 Cellulitis of left lower limb L97.328 Non-pressure chronic ulcer of left ankle with other specified severity T81.31XD Disruption of external operation (surgical) wound, not elsewhere classified, subsequent encounter Follow-up Appointments ppointment in 1 week. - that following monday 10/4 Return A Dressing Change Frequency Other: - home health to change dressing twice a week. Skin Barriers/Peri-Wound Care Moisturizing lotion Wound Cleansing May shower with protection. - use cast protector Primary Wound Dressing Wound #15 Left,Medial Malleolus Silver Collagen - moisten with hydrogel. Secondary Dressing Wound #15 Left,Medial Malleolus Dry Gauze ABD pad Edema Control Unna Boot to Left Lower Extremity Avoid standing for long periods of time Elevate legs to the level of the heart or above for 30 minutes daily and/or when sitting, a frequency of: - throughout the day Off-Loading Other: - ensure no pressure to wound areas. [place] skilled nursing for wound care. - [facility] home health to change twice a week. Electronic Signature(s) Signed: 02/24/2020 5:26:06 PM By: Kela Millin Signed: 02/26/2020 6:30:39 AM By: Linton Ham MD Entered By: Kela Millin on 02/24/2020 10:48:28 -------------------------------------------------------------------------------- Problem List Details Patient Name: Date of Service: [name], [name] 02/24/2020 10:00 A M Medical Record Number: [id number] Patient Account Number: [id number] Date of Birth/Sex: Treating RN: [birth year] (64 y.o. [name] Primary Care Provider: [name] Other Clinician: Referring Provider: Treating Provider/Extender: [name] in Treatment: 5 Active Problems ICD-10 Encounter Code Description Active Date MDM Diagnosis I87.332 Chronic venous hypertension (idiopathic) with ulcer  and inflammation of left 01/17/2020 No Yes lower extremity L97.828 Non-pressure chronic ulcer of other part of left lower leg with other specified 01/17/2020 No Yes severity L03.116 Cellulitis of left lower limb 01/31/2020 No Yes L97.328 Non-pressure chronic ulcer of left ankle with other specified severity 01/17/2020 No Yes T81.31XD Disruption of external operation (surgical) wound, not elsewhere classified, 01/17/2020 No Yes subsequent encounter Inactive Problems Resolved Problems Electronic Signature(s) Signed: 02/26/2020 6:30:39 AM By: Linton Ham MD Entered By: Linton Ham on 02/24/2020 11:14:06 -------------------------------------------------------------------------------- Progress Note Details Patient Name: Date of Service: [name], [name] 02/24/2020 10:00 A M Medical Record Number: [id number] Patient Account Number: [id number] Date of Birth/Sex: Treating RN: [birth year] (63 y.o. [name] Primary Care Provider: [name] Other Clinician: Referring Provider: Treating Provider/Extender: [name] in Treatment: 5 Subjective History of Present Illness (HPI) the remaining wound is over the left medial ankle. Similar wound over the right medial ankle healed largely with use of Apligraf. Most recently we have been using Hydrofera Blue over this wound with considerable improvement. The patient has been extensively worked up in the past for her venous insufficiency and she is not a candidate for antireflux surgery although I have none of the details available currently. 08/24/14; considerable improvement today. About 50% of this wound areas now epithelialized. The base of the wound appears to be healthier granulation.as  opposed to last week when she had deteriorated a considerable improvement 08/17/14; unfortunately the wound has regressed somewhat. The areas of epithelialization from the superior aspect are not nearly as healthy as they were  last week. The patient thinks her Hydrofera Blue slipped. 09/07/14; unfortunately the area has markedly regressed in the 2 weeks since I've seen this. There is an odor surrounding erythema. The healthy granulation tissue that we had at the base of the wound now is a dusky color. The nurse reports green drainage 09/14/14; the area looks somewhat better than last week. There is less erythema and less drainage. The culture I did did not show any growth. Nevertheless I think it is better to continue the Cipro and doxycycline for a further week. The remaining wound area was debridement. 09/21/14. Wound did not require debridement last week. Still less erythema and less drainage. She can complete her antibiotics. The areas of epithelialization in the superior aspect of the wound do not look as healthy as they did some weeks ago 10/05/14 continued improvement in the condition of this wound. There is advancing epithelialization. Less aggressive debridement required 10/19/14 continued improvement in the condition and volume of this wound. Less aggressive debridement to the inferior part of this to remove surface slough and fibrinous eschar 11/02/14 no debridement is required. The surface granulation appears healthy although some of her islands of epithelialization seem to have regressed. No evidence of infection 11/16/14; lites surface debridement done of surface eschar. The wound does not look to be unhealthy. No evidence of infection. Unfortunately the patient has had podiatry issues in the right foot and for some reason has redeveloped small surface ulcerations in the medial right ankle. Her original presentation involved wounds in this area 11/23/14 no debridement. The area on the right ankle has enlarged. The left ankle wound appears stable in terms of the surface although there is periwound inflammation. There has been regression in the amount of new skin 11/30/14 no debridement. Both wound areas appear healthy.  There was no evidence of infection. The the new area on the right medial ankle has enlarged although that both the surfaces appear to be stable. 12/07/14; Debridement of the right medial ankle wound. No no debridement was done on the left. 12/14/14 no major change in and now bilateral medial ankle wounds. Both of these are very painful but the no overt evidence of infection. She has had previous venous ablation 12/21/14; patient states that her right medial ankle wound is considerably more painful last week than usual. Her left is also somewhat painful. She could not tolerate debridement. The right medial ankle wound has fibrinous surface eschar 12/28/14 this is a patient with severe bilateral venous insufficiency ulcers. For a considerable period of time we actually had the one on the right medial ankle healed however this recently opened up again in June. The left medial ankle wound has been a refractory area with some absent flows. We had some success with Hydrofera Blue on this area and it literally closed by 50% however it is recently opened up Foley. Both of these were debridement today of surface eschar. She tolerates this poorly 01/25/15: No change in the status of this. Thick adherent escar. Very poor tolerance of any attempt at debridement. I had healed the right medial malleolus wound for a considerable amount of time and had the left one down to about 50% of the volume although this is totally regressed over the last 48 weeks. Further the right leg has reopened.  she is trying to make a appointment with pain and vascular, previous ablations with Dr. Aleda Grana. I do not believe there is an arterial insufficiency issue here 02/01/15 the status of the adherent eschar bilaterally is actually improved. No debridement was done. She did not manage to get vascular studies done 02/08/15 continued debridement of the area was done today. The slough is less adherent and comes off with less pressure. There is  no surrounding infection peripheral pulses are intact 02/15/15 selective debridement with a disposable curette. Again the slough is less adherent and comes off with less difficulty. No surrounding infection peripheral pulses are intact. 02/22/15 selective debridement of the right medial ankle wound. Slough comes off with less difficulty. No obvious surrounding infection peripheral pulses are intact I did not debridement the one on the left. Both of these are stable to improved 03/01/15 selective debridement of both wound areas using a curette to. Adherent slough cup soft with less difficulty. No obvious surrounding infection. The patient tells me that 2 days ago she noted a rash above the right leg wrap. She did not have this on her lower legs when she change this over she arrives with widespread left greater than right almost folliculitis-looking rash which is extremely pruritic. I don't see anything to culture here. There is no rash on the rest of her body. She feels well systemically. 03/08/15; selective debridement of both wounds using a curette. Base of this does not look unhealthy. She had limegreen drainage coming out of the left leg wound and describes a lot of drainage. The rash on her left leg looks improved to. No cultures were done. 03/22/15; patient was not here last week. Basal wounds does not look healthy and there is no surrounding erythema. No drainage. There is still a rash on the left leg that almost looks vasculitic however it is clearly limited to the top of where the wrap would be. 04/05/15; on the right required a surgical debridement of surface eschar and necrotic subcutaneous tissue. I did not debridement the area on the left. These continue to be large open wounds that are not changing that much. We were successful at one point in healing the area on the right, and at the same time the area on the left was roughly half the size of current measurements. I think a lot of the  deterioration has to do with the prolonged time the patient is on her feet at work 04/19/15 I attempted-like surface debridement bilaterally she does not tolerate this. She tells me that she was in allergic care yesterday with extreme pain over her left lateral malleolus/ankle and was told that she has an "sprain" 05/03/15; large bilateral venous insufficiency wounds over the medial malleolus/medial aspect of her ankles. She complains of copious amounts of drainage and his usual large amounts of pain. There is some increasing erythema around the wound on the right extending into the medial aspect of her foot to. historically she came in with these wounds the right one healed and the left one came down to roughly half its current size however the right one is reopened and the left is expanded. 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 05/10/15 large bilateral venous insufficiency wounds. There is less adherence surface left however the surface culture that I did last week grew pseudomonas therefore bilateral selective debridement score necessary. There is surrounding erythema. The patient describes severe bilateral drainage and  a lot of pain in the left ankle. Apparently her podiatrist was were ready to do a cortisone shot 05/17/15; the patient complains of pain and again copious amounts of drainage. 05/24/15; we used Iodo flex last week. Patient notes considerable improvement in wound drainage. Only needed to change this once. 05/31/15; we continued Iodoflex; the base of these large wounds bilaterally is not too bad but there is probably likely a significant bioburden here. I would like to debridement just doesn't tolerate it. 06/06/14 I would like to continue the Iodoflex although she still hasn't managed to obtain supplies. She has bilateral medial malleoli or large wounds which are mostly superficial. Both of them are covered circumferentially with  some nonviable fibrinous slough although she tolerates debridement very poorly. She apparently has an appointment for an ablation on the right leg by interventional radiology. 06/14/15; the patient arrives with the wounds and static condition. We attempted a debridement although she does not do well with this secondary to pain. I 07/05/15; wounds are not much smaller however there appears to be a cleaner granulating base. The left has tight fibrinous slough greater than the right. Debridement is tolerated poorly due to pain. Iodoflex is done more for these wounds in any of the multitude of different dressings I have tried on the left 1 and then subsequently the right. 07/12/15; no change in the condition of this wound. I am able to do an aggressive debridement on the right but not the left. She simply cannot tolerate it. We have been using Iodoflex which helps somewhat. It is worthwhile remembering that at one point we healed the right medial ankle wound and the left was about 25% of the current circumference. We have suggested returning to vascular surgery for review of possible further ablations for one reason or another she has not been able to do this. 07/26/15 no major change in the condition of either wound on her medial ankle. I did not attempt to debridement of these. She has been aggressively scrubbing these while she is in the shower at home. She has her supply of Iodoflex which seems to have done more for these wounds then anything I have put on recently. 08/09/15 wound area appears larger although not verified by measurements. Using Iodoflex 09/05/2015 -- she was here for avisit today but had significant problems with the wound and I was asked to see her for a physician opinion. I have summarize that this lady has had surgery on her left lower extremity about 10 years ago where the possible veins stripping was done. She has had an opinion from interventional radiology around November 2016 where  no further sclerotherapy was ordered. The patient works 12 hours a day and stands on a concrete floor with work boots and is unable to get the proper compression she requires and cannot elevate her limbs appropriately at any given time. She has recently grown Pseudomonas from her wound culture but has not started her ciprofloxacin which was called in for her. 09/13/15 this continues to be a difficult situation for this patient. At one point I had this wound down to a 1.5 x 1.5" wound on her left leg. This is deteriorated and the right leg has reopened. She now has substantial wounds on her medial calcaneus, malleoli and into her lower leg. One on the left has surface eschar but these are far too painful for me to debridement here. She has a vascular surgery appointment next week to see if anything can be done  to help here. I think she has had previous ablations several years ago at [facility]. She has no major edema. She tells me that she did not get product last time [facility]] and went for several days without it. She continues to work in work boots 12 hours a day. She cannot get compression/4-layer under her work boots. 09/20/15 no major change. Periwound edema control was not very good. Her point with pain and vascular is next Wednesday the 25th 09/28/15; the patient is seen vascular surgery and is apparently scheduled for repeat duplex ultrasounds of her bilateral lower legs next week. 10/05/15; the patient was seen by Dr. Doren Custard of vascular surgery. He feels that she should have arterial insufficiency excluded as cause/contributed to her nonhealing stage she is therefore booked for an arteriogram. She has apparently monophasic signals in the dorsalis pedis pulses. She also of course has known severe chronic venous insufficiency with previous procedures as noted previously. I had another long discussion with the patient today about her continuing to work 12 hour shifts. I've written her out for 2  months area had concerns about this as her work location is currently undergoing significant turmoil and this may lead to her termination. She is aware of this however I agree with her that she simply cannot continue to stand for 12 hours multiple days a week with the substantial wound areas she has. 10/19/15; the Dr. Doren Custard appointment was largely for an arteriogram which was normal. She does not have an arterial issue. He didn't make a comment about her chronic venous insufficiency for which she has had previous ablations. Presumably it was not felt that anything additional could be done. The patient is now out of work as I prescribed 2 weeks ago. Her wounds look somewhat less aggravated presumably because of this. I felt I would give debridement another try today 10/25/15; no major change in this patient's wounds. We are struggling to get her product that she can afford into her own home through her insurance. 11/01/15; no major change in the patient's wounds. I have been using silver alginate as the most affordable product. I spoke to Dr. Marla Roe last week with her requested take her to the OR for surgical debridement and placement of ACEL. Dr. Marla Roe told me that she would be willing to do this however [facility] will not cover this, fortunately the patient has [place] healthcare of some variant 11/08/15; no major change in the patient's wounds. She has been completely nonviable surface that this but is in too much pain with any attempted debridement are clinic. I have arranged for her to see Dr. Marla Roe ham of plastic surgery and this appointment is on Monday. I am hopeful that they will take her to the OR for debridement, possible ACEL ultimately possible skin graft 11/22/15 no major change in the patient's wounds over her bilateral medial calcaneus medial malleolus into the lower legs. Surface on these does not look too bad however on the left there is surrounding erythema and  tenderness. This may be cellulitis or could him sleepy tinea. 11/29/15; no major changes in the patient's wounds over her bilateral medial malleolus. There is no infection here and I don't think any additional antibiotics are necessary. There is now plan to move forward. She sees Dr. Marla Roe in a week's time for preparation for operative debridement and ACEL placement I believe on 7/12. She then has a follow-up appointment with Dr. Marla Roe on 7/21 12/28/15; the patient returns today  having been taken to the OR by Dr. Marla Roe 12/12/15 she underwent debridement, intraoperative cultures [which were negative]. She had placement of a wound VAC. Parent really ACEL was not available to be placed. The wound VAC foam apparently adhered to the wound since then she's been using silver alginate, Xeroform under Ace wraps. She still says there is a lot of drainage and a lot of pain 01/31/16; this is a patient I see monthly. I had referred her to Dr. Marla Roe him of plastic surgery for large wounds on her bilateral medial ankles. She has been to the OR twice once in early July and once in early August. She tells me over the last 3 weeks she has been using the wound VAC with ACEL underneath it. On the right we've simply been using silver alginate. Under Kerlix Coban wraps. 02/28/16; this is a patient I'm currently seeing monthly. She is gone on to have a skin graft over her large venous insufficiency ulcer on the left medial ankle. This was done by Dr. Marla Roe him. The patient is a bit perturbed about why she didn't have one on her right medial ankle wound. She has been using silver alginate to this. 03/06/16; I received a phone call from her plastic surgery Dr. Marla Roe. She expressed some concern about the viability of the skin graft she did on the left medial ankle wound. Asked me to place Endoform on this. She told me she is not planning to do a subsequent skin graft on the right as the left one did  not take very well. I had placed Hydrofera Blue on the right 03/13/16; continue to have a reasonably healthy wound on the right medial ankle. Down to 3 mm in terms of size. There is epithelialization here. The area on the left medial ankle is her skin graft site. I suppose the last week this looks somewhat better. She has an open area inferiorly however in the center there appears to be some viable tissue. There is a lot of surface callus and eschar that will eventually need to come off however none of this looked to be infected. Patient states that the is able to keep the dressing on for several days which is an improvement. 03/20/16 no major change in the circumference of either wound however on the left side the patient was at Dr. Eusebio Friendly office and they did a debridement of left wound. 50% of the wound seems to be epithelialized. I been using Endoform on the left Hydrofera Blue in the right 03/27/16; she arrives today with her wound is not looking as healthy as they did last week. The area on the right clearly has an adherent surface to this a very similar surface on the left. Unfortunately for this patient this is all too familiar problem. Clearly the Endoform is not working and will need to change that today that has some potential to help this surface. She does not tolerate debridement in this clinic very well. She is changing the dressing wants 04/03/16; patient arrives with the wounds looking somewhat better especially on the right. Dr. Migdalia Dk change the dressing to silver alginate when she saw her on Monday and also sold her some compression socks. The usefulness of the latter is really not clear and woman with severely draining wounds. 04/10/16; the patient is doing a bit of an experiment wearing the compression stockings that Dr. Migdalia Dk provided her to her left leg and the out of legs based dressings that we provided to the right.  05/01/16; the patient is continuing to wear compression  stockings Dr. Migdalia Dk provided her on the left that are apparently silver impregnated. She has been using Iodoflex to the right leg wound. Still a moderate amount of drainage, when she leaves here the wraps only last for 4 days. She has to change the stocking on the left leg every night 05/15/16; she is now using compression stockings bilaterally provided by Dr. Marla Roe. She is wearing a nonadherent layer over the wounds so really I don't think there is anything specific being done to this now. She has some reduction on the left wound. The right is stable. I think all healing here is being done without a specific dressing 06/09/16; patient arrives here today with not much change in the wound certainly in diameter to large circular wounds over the medial aspect of her ankle bilaterally. Under the light of these services are certainly not viable for healing. There is no evidence of surrounding infection. She is wearing compression stockings with some sort of silver impregnation as prescribed by Dr. Marla Roe. She has a follow-up with her tomorrow. 06/30/16; no major change in the size or condition of her wounds. These are still probably covered with a nonviable surface. She is using only her purchase stockings. She did see Dr. Marla Roe who seemed to want to apply Dakin's solution to this I'm not extreme short what value this would be. I would suggest Iodoflex which she still has at home. 07/28/16; I follow Mrs. Dalporto episodically along with Dr. Marla Roe. 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 She has been applying a topical collagen ointment to both wounds with Adaptic. I don't think Dr. Marla Roe is planning to take her back to the OR. 08/19/16; I follow Mrs. Jeneen Rinks on a monthly basis along with Dr. Marla Roe of plastic surgery. She has very refractory venous insufficiency wounds on the bilateral medial lower legs left greater than  right. I been following her for a number of years. At one point I was able to get the right medial malleolus wound to heal and had the left medial malleolus down to about half its current size however and I had to send her to plastic surgery for an operative debridement. Since then things have been stable to slightly improve the area on the right is slightly better one in the left about the same although there is much less adherent surface than I'm used to with this patient. She is using some form of liquid collagen gel that Dr. Marla Roe provided a Kerlix cover with the patient's own pressure stockings. She tells me that she has extreme pain in both ankles and along the lateral aspect of both feet. She has been unable to work for some period of time. She is telling me she is retiring at the beginning of April. She sees Dr. Doran Durand of orthopedics next week 09/22/16; patient has not seen Dr. Marla Roe since the last time she is here. I'm not really sure what she is using to the wounds other than bits and pieces of think she had left over including most recently Hydrofera Blue. She is using juxtalite stockings. She is having difficulty with her husband's recent illness "stroke". She is having to transport him to various doctors appointments. Dr. Marla Roe left her the option of a repeat debridement with ACEL however she has not been able to get the time to follow-up on this. She continues to have a fair amount of drainage  out of these wounds with certainly precludes leaving dressings on all week 10/13/16; patient has not seen Dr. Marla Roe since she was last in our clinic. I'm not really sure what she is doing with the wounds, we did try to get her [facility] and I think she is actually using this most of the time. Because of drainage she states she has to change this every second day although this is an improvement from what she used to do. She went to see Dr. Doran Durand who did not think she had a  muscular issue with regards to her feet, he referred her to a neurologist and I think the appointment is sometime in June. I changed her back to Iodoflex which she has used in the past but not recently. 11/03/16; the patient has been using Iodoflex although she ran out of this. Still claims that there is a lot of drainage although the wound does not look like this. No surrounding erythema. She has not been back to see Dr. Marla Roe 11/24/16; the patient has been using Iodoflex again but she ran out of it 2 or 3 days ago. There is no major change in the condition of either one of these wounds in fact they are larger and covered in a thick adherent surface slough/nonviable tissue especially on the left. She does not tolerate mechanical debridement in our clinic. Going back to see Dr. Marla Roe of plastic surgery for an operative debridement would seem reasonable. 12/15/16; the patient has not been back to see Dr. Marla Roe. She is been dealing with a series of illnesses and her husband which of monopolized her time. She is been using Sorbact which we largely supplied. She states the drainage is bad enough that it maximum she can go 2-3 days without changing the dressing 01/12/2017 -- the patient has not been back for about 4 weeks and has not seen Dr. Marla Roe not does she have any appointment pending. 01/23/17; patient has not seen Dr. Marla Roe even though I suggested this previously. She is using Santyl that was suggested last week by Dr. Con Memos this Cost her $16 through her insurance which is indeed surprising 02/12/17; continuing Santyl and the patient is changing this daily. A lot of drainage. She has not been back to see plastic surgery she is using an Ace wrap. Our intake nurse suggested wrap around stockings which would make a good reasonable alternative 02/26/17; patient is been using Santyl and changing this daily due to drainage. She has not been to see plastic surgery she uses in April Ace  wrap to control the edema. She did obtain extremitease stockings but stated that the edema in her leg was to big for these 03/20/17; patient is using Santyl and Anasept. Surfaces looked better today the area on the right is actually measuring a little smaller. She has states she has a lot of pain in her feet and ankles and is asking for a consult to pain control which I'll try to help her with through our case manager. 04/10/17; the patient arrives with better-looking wound surfaces and is slightly smaller wound on the left she is using a combination of Santyl and Anasept. She has an appointment or at least as started in the pain control center associated with [facility] 05/14/17; this is a patient who I followed for a prolonged period of time. 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 At one point I had this down to a much smaller wound on the left however  these reopened and we've never been able to get these to heal. She has been using Santyl and Anasept gel although 2 weeks ago she ran out of the Anasept gel. She has a stable appearance of the wound. She is going to the wound care clinic at [facility]. They wanted do a nerve block/spinal block although she tells me she is reluctant to go forward with that. 05/21/17; this is a patient I have followed for many years. 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 Chronic pain and deformity in her ankles as well. She is been to see plastic surgery as well as orthopedics. Using PolyMem AG most recently/Kerramax/ABDs and 2 layer compression. She has managed to keep this on and she is coming in for a nurse check to change the dressing on Tuesdays, we see her on Fridays 06/05/17; really quite a good looking surface and the area especially on the right medial has contracted in terms of dimensions. Well granulated healthy-looking tissue on both sides. Even with an open curet there is nothing that even  feels abnormal here. This is as good as I've seen this in quite some time. We have been using PolyMem AG and bringing her in for a nurse check 06/12/17; really quite good surface on both of these wounds. The right medial has contracted a bit left is not. We've been using PolyMem and AG and she is coming in for a nurse visit 06/19/17; we have been using PolyMem AG and bringing her in for a nurse check. Dimensions of her wounds are not better but the surfaces looked better bilaterally. She complained of bleeding last night and the left wound and increasing pain bilaterally. She states her wound pain is more neuropathic than just the wounds. There was some suggestion that this was radicular from her pain management doctor in talking to her it is really difficult to sort this out. 06/26/17; using PolyMem and AG and bringing her in for a nurse check as All of this and reasonably stable condition. Certainly not improved. The dimensions on the lateral part of the right leg look better but not really measuring better. The medial aspect on the left is about the same. 07/03/16; we have been using PolyMen AG and bringing her in for a nurse check to change the dressings as the wounds have drainage which precludes once weekly changing. We are using all secondary absorptive dressings.our intake nurse is brought up the idea of using a wound VAC/snap VAC on the wound to help with the drainage to see if this would result in some contraction. This is not a bad idea. The area on the right medial is actually looking smaller. Both wounds have a reasonable-looking surface. There is no evidence of cellulitis. The edema is well controlled 07/10/17; the patient was denied for a snap VAC by her insurance. The major issue with these wounds continues to be drainage. We are using wicked PolyMem AG and she is coming in for a nurse visit to change this. The wounds are stable to slightly improved. The surface looks vibrant and the area on  the right certainly has shrunk in size but very slowly 07/17/17; the patient still has large wounds on her bilateral medial malleoli. Surface of both of these wounds looks better. The dimensions seem to come and go but no consistent improvement. There is no epithelialization. We do not have options for advanced treatment products due to insurance issues. They did not approve of the wound VAC  to help control the drainage. More recently we've been using PolyMem and AG wicked to allow drainage through. We have been bringing her in for a nurse visit to change this. We do not have a lot of options for wound care products and the home again due to insurance issues 07/24/17; the patient's wound actually looks somewhat better today. No drainage measurements are smaller still healthy-looking surface. We used silver collagen under PolyMen started last week. We have been bringing her in for a dressing change 07/31/17; patient's wound surface continued to look better and I think there is visible change in the dimensions of the wound on the right. Rims of epithelialization. We have been using silver collagen under PolyMen and bringing her in for a dressing change. There appears to be less drainage although she is still in need of the dressing change 08/07/17. Patient's wound surface continues to look better on both sides and the area on the right is definitely smaller. We have been using silver collagen and PolyMen. She feels that the drainage has been it has been better. I asked her about her vascular status. She went to see Dr. Aleda Grana at [facility] and had some form of ablation. I don't have much detail on this. I haven't my notes from 2016 that she was not a candidate for any further ablation but I don't have any more information on this. We had referred her to vein and vascular I don't think she ever went. He does not have a history of PAD although I don't have any information on this either. We don't even  have ABIs in our record 08/14/17; we've been using silver collagen and PolyMen cover. And putting the patient and compression. She we are bringing her in as a nurse visit to change this because ofarge amount of drainage. We didn't the ABIs in clinic today since they had been done in many moons 1.2 bilaterally. She has been to see vein and vascular however this was at [facility] and she had ablation although I really don't have any information on this all seemed biking get a report. She is also been operatively debrided by plastic surgery and had a cell placed probably 8-12 months ago. This didn't have a major effect. We've been making some gains with current dressings 08/19/17-She is here in follow-up evaluation for bilateral medial malleoli ulcers. She continues to tolerate debridement very poorly. We will continue with recently changed topical treatment; if no significant improvement may consider switching to Iodosorb/Iodoflex. 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 She has been using silver collagen and PolyMem. I believe she has been used and tried on Iodoflex before. 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 These have been very refractory over time. We have been using silver collagen and PolyMen. She is coming in once a week for a doctor's and once a week for nurse visits. We are actually making some progress 09/18/17; the patient's wounds are smaller especially on the right medial. She arrives today to upset to consider even washing these off with Anasept which I think is been part of the reason this is been closing. We've been using collagen covered in PolyMen otherwise. It is noted that she has a small area of folliculitis on the right medial calf  that. As we are wrapping her legs I'll give her a short course of doxycycline to make sure this doesn't amount to anything. She is a long list of complaints today including imbalance, shortness of breath on exertion, inversion of her left ankle. With regards to the latter complaints she is been to see orthopedics and they offered her a tendon release surgery I believe but wanted her wounds to be closed first. I have recommended she go see her primary physician with regards to everything else. 09/25/17; patient's wounds are about the same size. We have made some progress bilaterally although not in recent weeks. She will not allow me T wash these wounds with Anasept even if she is doing her cell. Wheeze we've been using collagen covered in PolyMen. Last week she had a small area of folliculitis this is now opened into a small wound. She completed 5 days of trimethoprim sulfamethoxazole 10/02/17; unfortunately the area on her left medial ankle is worse with a larger wound area towards the Achilles. The patient complains of a lot of pain. She will not allow debridement although visually I don't think there is anything to debridement in any case. We have been using silver collagen and PolyMen for several months now. Initially we are making some progress although I'm not really seeing that today. We will move back to [facility]. His admittedly this is a bit of a repeat however I'm hoping that his situation is different now. The patient tells me she had her leg on the left give out on her yesterday this is process some pain. 10/09/17; the patient is seen twice a week largely because of drainage issues coming out of the chronic medial bimalleolar wounds that are chronic. Last week the dimensions of the one on the left looks a little larger I changed her to [facility]. She comes in today with a history of terrible pain in the bilateral wound areas. She will not allow debridement. She will not even allow a  tissue culture. There is no surrounding erythema no no evidence of cellulitis. We have been putting her Kerlix Coban man. She will not allow more aggressive compression as there was a suggestion to put her in 3 layer wraps. 10/16/17; large wounds on her bilateral medial malleoli. These are chronic. Not much change from last week. The surface looks have healthy but absolutely no epithelialization. A lot of pain little less so of drainage. She will not allow debridement or even washing these off in the vigorous fashion with Anasept. 10/23/17; large wounds on her bilateral malleoli which are chronic. Some improvement in terms of size perhaps on the right since last time I saw these. She states that after we increased the 3 layer compression there was some bleeding, when she came in for a nurse visit she did not want 3 layer compression put back on about our nurse managed to convince her. She has known chronic venous visit issues and I'm hoping to get her to tolerate the 3 layer compression. using Hydrofera Blue 10/30/17; absolutely no change in the condition of either wound although we've had some improvement in dimensions on the right.. Attempted to put her in 3 layer compression she didn't tolerated she is back in 2 layer compression. We've been using Hydrofera Blue We looked over her past records. She had venous reflux studies in November 2016. There was no evidence of deep venous reflux on the right. Superficial vein did not show the greater saphenous vein at think this  is been previously ablated the small saphenous vein was within normal limits. The left deep venous system showed no DVT the vessels were positive for deep venous reflux in the posterior tibial veins at the ankle. The greater saphenous vein was surgically absent small saphenous vein was within normal limits. She went to vein and vascular at [facility]. I believe she had an ablation on the left greater saphenous vein. I'll update her  reflux studies perhaps ever reviewed by vein and vascular. We've made absolutely no progress in these wounds. Will also try to read and TheraSkins through her insurance 11/06/17; W the patient apparently has a 2 week follow-up with vein and vascular I like him to review the whole issue with regards to her previous vascular workup by Dr. Aleda Grana. We've really made no progress on these wounds in many months. She arrives today with less viable looking surface on the left medial ankle wound. This was apparently looking about the same on Tuesday when she was here for nurse visit. 11/13/17; deep tissue culture I did last time of the left lower leg showed multiple organisms without any predominating. In particular no Staphylococcus or group A strep were isolated. We sent her for venous reflux studies. She's had a previous left greater saphenous vein stripping and I think sclerotherapy of the right greater saphenous vein. She didn't really look at the lesser saphenous vein this both wounds are on the medial aspect. She has reflux in the common femoral vein and popliteal vein and an accessory vein on the right and the common femoral vein and popliteal vein on the left. I'm going to have her go to see vein and vascular just the look over things and see if anything else beside aggressive compression is indicated here. We have not been able to make any progress on these wounds in spite of the fact that the surface of the wounds is never look too bad. 11/20/17; no major change in the condition of the wounds. Patient reports a large amount of drainage. She has a lot of complaints of pain although enlisting her today I wonder if some of this at least his neuropathic rather than secondary to her wounds. She has an appointment with vein and vascular on 12/30/17. The refractory nature of these wounds in my mind at least need vein and vascular to look over the wounds the recent reflux studies we did and her history to  see if anything further can be done here. I also note her gait is deteriorated quite a bit. 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 She has a bilateral Trendelenburg gait. I wonder if this is neuropathic or perhaps multilevel radicular. 11/27/17; her wounds actually looks slightly better. Healthy-looking granulation tissue a scant amount of epithelialization. [place] [employer] will not pay for [employer]. They will play for tri layer Oasis and Dermagraft. This is not a diabetic ulcer. We'll try for the tri layer Oasis. She still complains of some drainage. She has a vein and vascular appointment on 12/30/17 12/04/17; the wounds visually look quite good. Healthy-looking granulation with some degree of epithelialization. We are still waiting for response to our request for trial to try layer Oasis. Her appointment with vascular to review venous and arterial issues isn't sold the end of July 7/31. Not allow debridement or even vigorous cleansing of the wound surface. 12/18/17; slightly smaller especially on the right. 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 We've been using Hydrofera Blue. We still  are looking into triple layer Oasis through her insurance 01/08/18 on evaluation today patient's wound actually appears to be showing signs of good improvement at this point in time. She has been tolerating the dressing changes without complication. Fortunately there does not appear to be any evidence of infection at this point in time. We have been utilizing silver nitrate which does seem to be of benefit for her which is also good news. Overall I'm very happy with how things seem to be both regards appearance as well as measurement. Patient did see Dr. Bridgett Larsson for evaluation on 12/30/17. In his assessment he felt that stripping would not likely add much more than chronic compression to the patient's healing process. His recommendation was to follow-up in three months with  Dr. Doren Custard if she hasn't healed in order to consider referral back to you and see vascular where she previously was in a trial and was able to get her wound to heal. I'll be see what she feels she when you staying compression and he reiterated this as well. 01/13/18 on evaluation today patient appears to actually be doing very well in regard to her bilateral medial malleolus ulcers. She seems to have tolerated the chemical cauterization with silver nitrate last week she did have some pain through that evening but fortunately states that I'll be see since it seems to be doing better she is overall pleased with the progress. 01/21/18; really quite a remarkable improvement since I've last seen these wounds. We started using silver nitrate specially on the islands of hyper granulation which for some reason her around the wound circumference. This is really done quite nicely. Primary dressing Hydrofera Blue under 4 layer compression. She seems to be able to hold out without a nurse rewrap. Follow-up in 1 week 01/28/18; we've continued the hydrofera blue but continued with chemical cauterization to the wound area that we started about a month ago for irregular hyper granulation. She is made almost stunning improvement in the overall wound dimensions. I was not really expecting this degree of improvement in these chronic wounds 02/05/18; we continue with Hydrofera Bluebut of also continued the aggressive chemical cauterization with silver nitrate. We made nice progress with the right greater than left wound. 02/12/18. We continued with Hydrofera Blue after aggressive chemical cauterization with silver nitrate. We appear to be making nice progress with both wound areas 02/19/2018; we continue with [facility] after washing the wounds vigorously with Anasept spray and chemical cauterization with silver nitrate. We are making excellent progress. The area on the right's just about closed 02/26/2018. The area on the  left medial ankle had too much necrotic debris today. I used a #5 curette we are able to get most of the soft. I continued with the silver nitrate to the much smaller wound on the right medial ankle she had a new area on her right lower pretibial area which she says was due to a role in her compression 03/05/2018; both wound areas look healthy. Not much change in dimensions from last week. I continue to use silver nitrate and Hydrofera Blue. The patient saw Dr. Doren Custard of vein and vascular. He felt she had venous stasis ulcers. He felt based on her previous arteriogram she should have adequate circulation for healing. Also she has deep venous reflux but really no significant correctable superficial venous reflux at this time. He felt we should continue with conservative management including leg elevation and compression 04/02/2018; since we last saw this woman about a month  ago she had a fall apparently suffered a pelvic fracture. I did not look up the x-ray. Nevertheless because of pain she literally was bedbound for 2 weeks and had home health coming out to change the dressing. Somewhat predictably this is resulted in considerable improvement in both wound areas. The right is just about closed on the medial malleolus and the left is about half the size. 04/16/2018; both her wounds continue to go down in size. Using Hydrofera Blue. 05/07/18; both her wounds appeared to be improving especially on the right where it is almost closed. We are using Hydrofera Blue 05/14/2018; slightly worse this week with larger wounds. Surface on the left medial not quite as good. We have been using Hydrofera Blue 05/21/18; again the wounds are slightly larger. Left medial malleolus slightly larger with eschar around the circumference. We have been using Hydrofera Blue undergoing a wraps for a prolonged period of time. This got a lot better when she was more recumbent due to a fall and a back injury. I change the  primary dressing the silver alginate today. She did not tolerate a 4 layer compression previously although I may need to bring this up with her next time 05/28/2018; area on the left medial malleolus again is slightly larger with more drainage. Area on the right is roughly unchanged. She has a small area of folliculitis on the right medial just on the lower calf. This does not look ominous. 06/03/2018 left medial malleolus slightly smaller in a better looking surface. We used silver nitrate on this last time with silver alginate. The area on the right appears slightly smaller 1/10; left medial malleolus slightly smaller. Small open area on the right. We used silver nitrate and silver alginate as of 2 weeks ago. We continue with the wound and compression. These got a lot better when she was off her feet 1/17; right medial malleolus wound is smaller. The left may be slightly smaller. Both surfaces look somewhat better. 1/24; both wounds are slightly smaller. Using silver alginate under Unna boots 1/31; both wounds appear smaller in fact the area on the right medial is just about closed. Surface eschar. We have been using silver alginate under Unna boots. The patient is less active now spends let much less time on her feet and I think this is contributed to the general improvement in the wound condition 2/7; both wounds appear smaller. I was hopeful the right medial would be closed however there there is still the same small open area. Slight amount of surface eschar on the left the dimensions are smaller there is eschar but the wound edges appear to be free. We have been using silver alginate under Unna boot's 2/14; both wounds once again measure smaller. Circumferential eschar on the left medial. We have been using silver alginate under Unna boots with gradual improvement 2/21; the area on the right medial malleolus has healed. The area on the left is smaller. We have been using silver alginate and Unna  boots. We can discharge wrapping the right leg she has 20/30 stockings at home she will need to protect the scar tissue in this area 2/28; the area on the right medial malleolus remains closed the patient has a compression stocking. The area on the left is smaller. We have been using silver alginate and Unna boots. 3/6 the area on the right medial ankle remains closed. Good edema control noted she is using her own compression stocking. The area on the left medial ankle is  smaller. We have been managing this with silver alginate and Unna boots which we will continue today. 3/13; the area on the right medial ankle remains closed and I'm declaring it healed today. When necessary the left is about the same still a healthy-looking surface but no major change and wound area. No evidence of infection and using silver alginate under unna and generally making considerable improvement 3/27 the area on the right medial ankle remains closed the area on the left is about the same as last week. Certainly not any worse we have been using silver alginate under an Unna boot 4/3; the area on the right medial ankle remains closed per the patient. We did not look at this wound. The wound on the left medial ankle is about the same surface looks healthy we have been using silver alginate under an Unna boot 4/10; area on the right medial ankle remains closed per the patient. We did not look at this wound. The wound on the left medial ankle is slightly larger. The patient complains that the [facility] caused burning pain all week. She also told us that she was a lot more active this week. Changed her back to silver alginate 4/17; right medial ankle still closed per the patient. Left medial ankle is slightly larger. Using silver alginate. She did not tolerate Hydrofera Blue on this area 4/24; right medial ankle remains closed we have not look at this. The left medial ankle continues to get larger today by about a  centimeter. We have been using silver alginate under Unna boots. She complains about 4 layer compression as an alternative. She has been up on her feet working on her garden 5/8; right medial ankle remains closed we did not look at this. The left medial ankle has increased in size about 100%. We have been using silver alginate under Unna boots. She noted increased pain this week and was not surprised that the wound is deteriorated 5/15; no major change in SA however much less erythema ( one week of doxy ocellulitis). 5/22-64 year old female returns at 1 week to the clinic for left medial ankle wound for which we have been using silver alginate under 3 layer compression She was placed on DOXY at last visit - the wound is wider at this visit. She is in 3 layer compression 5/29; change to [facility] last week. I had given her empiric doxycycline 2 weeks ago for a week. She is in 3 layer compression. She complains of a lot of pain and drainage on presentation today. 6/5; using Hydrofera Blue. I gave her doxycycline recently empirically for erythema and pain around the wound. Believe her cultures showed enterococcus which not would not have been well covered by doxycycline nevertheless the wound looks better and I don't feel specifically that the enterococcus needs to be covered. She has a new what looks like a wrap injury on her lateral left ankle. 6/12; she is using Hydrofera Blue. She has a new area on the left anterior lower tibial area. This was a wrap injury last week. 6/19; the patient is using Hydrofera Blue. She arrived with marked inflammation and erythema around the wound and tenderness. 12/01/18 on evaluation today patient appears to be doing a little bit better based on what I'm hearing from the standpoint of lassos evaluation to this as far as the overall appearance of the wound is concerned. Then sometime substandard she typically sees Dr. Dellia Nims. Nonetheless overall very pleased with the  progress that she's made  up to this point. No fevers, chills, nausea, or vomiting noted at this time. 7/10; some improvement in the surface area. Aggressively debrided last week apparently. I went ahead with the debridement today although the patient does not tolerate this very well. We have been using Iodoflex. Still a fair amount of drainage 7/17; slightly smaller. Using Iodoflex. 7/24; no change from last week in terms of surface area. We have been using Iodoflex. Surface looks and continues to look somewhat better 7/31; surface area slightly smaller better looking surface. We have been using Iodoflex. This is under Unna boot compression 8/7-Patient presents at 1 week with Unna boot and Iodoflex, wound appears better 8/14-Patient presents at 1 week with Iodoflex, we use the Unna boot, wound appears to be stable better.Patient is getting Botox treatment for the inversion of the foot for tendon release, Next week 8/21; we are using Iodoflex. Unna boot. The wound is stable in terms of surface area. Under illumination there is some areas of the wound that appear to be either epithelialized or perhaps this is adherent slough at this point I was not really clear. It did not wipe off and I was reluctant to debride this today. 8/28; we are using Iodoflex in an Unna boot. Seems to be making good improvement. 9/4; using Iodoflex and wound is slightly smaller. 9/18; we are using Iodoflex with topical silver nitrate when she is here. The wound continues to be smaller 10/2; patient missed her appointment last week due to GI issues. She left and Iodoflex based dressing on for 2 weeks. Wound is about the same size about the size of a dime on the left medial lower 10/9 we have been using Iodoflex on the medial left ankle wound. She has a new superficial probable wrap injury on the dorsal left ankle 10/16; we have been using Hydrofera Blue since last week. This is on the left medial ankle 10/23; we have been  using Hydrofera Blue since 2 weeks ago. This is on the left medial ankle. Dimensions are better 11/6; using Hydrofera Blue. I think the wound is smaller but still not closed. Left medial ankle 11/13; we have been using Hydrofera Blue. Wound is certainly no smaller this week. Also the surface not as good. This is the remanent of a very large area on her left medial ankle. 11/20; using Sorbact since last week. Wound was about the same in terms of size although I was disappointed about the surface debris 12/11; 3-week follow-up. Patient was on vacation. Wound is measuring slightly larger we have been using Sorbact. 12/18; wound is about the same size however surface looks better last week after debridement. We have been using Sorbact under compression 1/15 wound is probably twice the size of last time increased in length nonviable surface. We have been using Sorbact. She was running a mild fever and missed her appointment last week 1/22; the wound is come down in size but under illumination still a very adherent debris we have been Hydrofera Blue that I changed her to last week 1/29; dimensions down slightly. We have been using Hydrofera Blue 2/19 dimensions are the same however there is rims of epithelialization under illumination. Therefore more the surface area may be epithelialized 2/26; the patient's wound actually measures smaller. The wound looks healthy. We have been using Hydrofera Blue. I had some thoughts about running Apligraf then I still may do that however this looks so much better this week we will delay that for now 3/5; the wound  is small but about the same as last week. We have been using Hydrofera Blue. No debridement is required today. 3/19; the wound is about the size of a dime. Healthy looking wound even under illumination. We have been using Hydrofera Blue. No mechanical debridement is necessary 3/26; not much change from last week although still looks very healthy. We have been  using Hydrofera Blue under Unna boots Patient was offered an ankle fusion by podiatry but not until the wound heals with a proceed with this. 4/9; the patient comes in today with her original wound on the medial ankle looking satisfactory however she has some uncontrolled swelling in the middle part of her leg with 2 new open areas superiorly just lateral to the tibia. I think this was probably a wrap issue. She said she felt uncomfortable during the week but did not call in. We have been using Hydrofera Blue 4/16; the wound on the medial ankle is about the same. She has innumerable small areas superior to this across her mid tibia. I think this is probably folliculitis. She is also been working in the yard doing a lot of sweating 4/30; the patient issue on the upper areas across her mid tibia of all healed. I think this was excessive yard work if I remember. Her wound on the medial ankle is smaller. Some debris on this we have been using Hydrofera Blue under Unna boots 5/7; mid tibia. She has been using Hydrofera Blue under an Unna wrap. She is apparently going for her ankle surgery on June 3 10/28/19-Patient returns to clinic with the ankle wound, we are using Hydrofera Blue under Unna wrap, surgery is scheduled for her left foot for June 3 so she will be back for nurse visit next week READMISSION 01/17/2020 Mrs. Colquhoun is a 64 year old woman we have had in this clinic for a long period of time with severe venous hypertension and refractory wounds on her medial lower legs and ankles bilaterally. 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 When she was off her legs for a prolonged period example when she fell and suffered a compression fracture things would heal up quite nicely. She is now retired and we managed to heal up the right medial leg wound. The left one was very tiny  last time I saw this although still refractory. 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 It got to the point where this was interfering with her walking and she elected to proceed with a ankle arthrodesis to straighten her her ankle and leave her with a functional outcome for mobilization. The patient was referred to Dr. Doren Custard and really this took some time to arrange. Dr. Doren Custard saw her on 12/07/2019. Once again he verified that she had no arterial issues. She had previously had an angiogram several years ago. Follow-up ABIs on the left showed an ABI of 1.12 with triphasic waveforms and a TBI of 0.92. She is felt to have chronic deep venous insufficiency but I do not think it was felt that anything could be done from about this from an ablation point of view. At the time Dr. Doren Custard saw this patient the wounds actually look closed via the pictures in his clinic. The patient finally underwent her surgery on 12/15/2019. This went reasonably well  and there was a good anatomic outcome. She developed a small distal wound dehiscence on the lateral part of the surgical wound. However more problematically she is developed recurrence of the wound on the medial left ankle. There are actually 2 wounds here one in the distal lower leg and 1 pretty much at the level of the medial malleolus. It is a more distal area that is more problematic. She has been using Hydrofera Blue which started on Friday before this she was simply Ace wrapping. There was a culture done that showed Pseudomonas and she is on ciprofloxacin. A recent CNS on 8/11 was negative. The patient reports some pain but I generally think this is improving. She is using a cam boot completely nonweightbearing using a walker for pivot transfers and a wheelchair 8/24; not much improvement unfortunately she has a surgical wound on the lateral part in the venous  insufficiency wound medially. The bottom part of the medial insufficiency wound is still necrotic there is exposed tendon here. We have been using Hydrofera Blue under compression. Her edema control is however better 8/31; patient in for follow-up of his surgical wound on the lateral part of her left leg and chronic venous insufficiency ulcers medially. We put her back in compression last week. She comes in today with a complaint of 3 or 4 days worth of increasing pain. She felt her cam walker was rubbing on the area on the back of her heel. However there is intense erythema seems more likely she has cellulitis. She had 2 cultures done when she was seeing podiatry in the postop. One of them in late July showed Pseudomonas and she received a course of ciprofloxacin the other was negative on 8/11 she is allergic to penicillin with anaphylactoid complaints of hives oral swelling via information in epic 9/9; when I saw this patient last week she had intense anterior erythema around her wound on the right lateral heel and ankle and also into the right medial heel. Some of this was no doubt drainage and her walker boot however I was convinced she had cellulitis. I gave her Levaquin and Bactrim she is finishing up on this now. She is following up with Dr. [name] he saw her yesterday. He is taken her out of the walking boot of course she is still nonweightbearing. Her x-ray was negative for any worrisome features such as soft tissue air etc. Things are a lot better this week. She has home health. We have been using Hydrofera Blue under an [employer] which she put back on yesterday. I did not wrap her last week 9/17; her surrounding skin looks a lot better. In fact the area on the left lateral ankle has just a scant amount of eschar. The only remaining wound is the large area on the left medial ankle. Probably about 60% of this is healthy granulation at the surface however she has a significant divot distally.  This has adherent debris in it. I been using debridement and silver collagen to try and get this area to fill-in although I do not think we have made much progress this week 9/24; the patient's wound on the left medial ankle looks a lot better. The deeper divot area distally still requires debridement but this is cleaning up quite nicely we have been using silver collagen. The patient is complaining of swelling in her foot and is worried that that is contributing to the nonhealing of the ankle wound. She is also complaining of numbness in her  anterior toes Objective Constitutional Sitting or standing Blood Pressure is within target range for patient.. Pulse regular and within target range for patient.Marland Kitchen Respirations regular, non-labored and within target range.. Temperature is normal and within the target range for the patient.Marland Kitchen Appears in no distress. Vitals Time Taken: 10:04 AM, Height: 68 in, Temperature: 98.4 F, Pulse: 64 bpm, Respiratory Rate: 16 breaths/min, Blood Pressure: 127/71 mmHg. Cardiovascular Pedal pulses are palpable. She has some swelling of the left foot but nothing looks infected or ominous. General Notes: Wound exam; left medial ankle we have much better control of the debris on the distal part of the wound where the deeper aspect is. I am still using a #3 curette over a small remaining surface area which is actually the deepest area of the wound. This does not probe to bone there is no evidence of active infection. Hemostasis with a pressure dressing. Integumentary (Hair, Skin) Wound #15 status is Open. Original cause of wound was Gradually Appeared. The wound is located on the Left,Medial Malleolus. The wound measures 6.1cm length x 2.9cm width x 1.2cm depth; 13.894cm^2 area and 16.672cm^3 volume. There is tendon and Fat Layer (Subcutaneous Tissue) exposed. There is no tunneling or undermining noted. There is a medium amount of serosanguineous drainage noted. The wound  margin is flat and intact. There is large (67-100%) red, pink granulation within the wound bed. There is a small (1-33%) amount of necrotic tissue within the wound bed including Adherent Slough. Assessment Active Problems ICD-10 Chronic venous hypertension (idiopathic) with ulcer and inflammation of left lower extremity Non-pressure chronic ulcer of other part of left lower leg with other specified severity Cellulitis of left lower limb Non-pressure chronic ulcer of left ankle with other specified severity Disruption of external operation (surgical) wound, not elsewhere classified, subsequent encounter Procedures Wound #15 Pre-procedure diagnosis of Wound #15 is a Venous Leg Ulcer located on the Left,Medial Malleolus .Severity of Tissue Pre Debridement is: Fat layer exposed. There was a Excisional Skin/Subcutaneous Tissue Debridement with a total area of 1 sq cm performed by [name]., MD. With the following instrument(s): Curette Material removed includes Subcutaneous Tissue after achieving pain control using Other (benzocaine, 20%). No specimens were taken. A time out was conducted at 10:45, prior to the start of the procedure. A Minimum amount of bleeding was controlled with Pressure. The procedure was tolerated well with a pain level of 0 throughout and a pain level of 0 following the procedure. Post Debridement Measurements: 6.1cm length x 2.9cm width x 1.2cm depth; 16.672cm^3 volume. Character of Wound/Ulcer Post Debridement is improved. Severity of Tissue Post Debridement is: Fat layer exposed. Post procedure Diagnosis Wound #15: Same as Pre-Procedure Pre-procedure diagnosis of Wound #15 is a Venous Leg Ulcer located on the Left,Medial Malleolus . There was a Haematologist Compression Therapy Procedure by Baruch Gouty, RN. Post procedure Diagnosis Wound #15: Same as Pre-Procedure Plan Follow-up Appointments: Return Appointment in 1 week. - that following monday 10/4 Dressing  Change Frequency: Other: - home health to change dressing twice a week. Skin Barriers/Peri-Wound Care: Moisturizing lotion Wound Cleansing: May shower with protection. - use cast protector Primary Wound Dressing: Wound #15 Left,Medial Malleolus: Silver Collagen - moisten with hydrogel. Secondary Dressing: Wound #15 Left,Medial Malleolus: Dry Gauze ABD pad Edema Control: Unna Boot to Left Lower Extremity Avoid standing for long periods of time Elevate legs to the level of the heart or above for 30 minutes daily and/or when sitting, a frequency of: - throughout the day Off-Loading:  Other: - ensure no pressure to wound areas. Home Health: [facility] for wound care. - [facility] to change twice a week. 1. I continued with the silver collagen ABD and then Unna boot to the left lower extremity 2. I would like to order an Apligraf for this surgical wound in the setting of severe chronic venous insufficiency. I think this would give [place] the best chance to close in this wound and the least amount of time. We are getting to the point where the surface of the wound looks satisfactory for consideration of an advanced treatment product Electronic Signature(s) Signed: 02/26/2020 6:30:39 AM By: Linton Ham MD Entered By: Linton Ham on 02/24/2020 11:19:10 -------------------------------------------------------------------------------- SuperBill Details Patient Name: Date of Service: [facility] [name], [name]. 02/24/2020 Medical Record Number: [id number] Patient Account Number: [id number] Date of Birth/Sex: Treating RN: 1956-03-10 (64 y.o. [name] Primary Care Provider: [name] Other Clinician: Referring Provider: Treating Provider/Extender: [name] in Treatment: 5 Diagnosis Coding ICD-10 Codes Code Description [phone] Chronic venous hypertension (idiopathic) with ulcer and inflammation of left lower  extremity L97.828 Non-pressure chronic ulcer of other part of left lower leg with other specified severity L03.116 Cellulitis of left lower limb L97.328 Non-pressure chronic ulcer of left ankle with other specified severity T81.31XD Disruption of external operation (surgical) wound, not elsewhere classified, subsequent encounter Facility Procedures CPT4 Code: [id number] Description: 11021 - DEB SUBQ TISSUE 20 SQ CM/< ICD-10 Diagnosis Description L97.328 Non-pressure chronic ulcer of left ankle with other specified severity Modifier: Quantity: 1 Physician Procedures Electronic Signature(s) Signed: 02/26/2020 6:30:39 AM By: Linton Ham MD Entered By: Linton Ham on 02/24/2020 11:19:22

## 2020-02-27 NOTE — Progress Notes (Signed)
[name], [name] ([id number]) Visit Report for 02/24/2020 Arrival Information Details Patient Name: Date of Service: [employer], [name] 02/24/2020 10:00 A M Medical Record Number: [id number] Patient Account Number: [id number] Date of Birth/Sex: Treating RN: 10/27/1955 (64 y.o. [name] Primary Care [name]: [name] Other Clinician: Referring [name]: Treating [name]/Extender: [name] in Treatment: 5 Visit Information History Since Last Visit Added or deleted any medications: No Patient Arrived: [name] Any new allergies or adverse reactions: No Arrival Time: 10:04 Had a fall or experienced change in No Accompanied By: alone activities of daily living that may affect Transfer Assistance: None risk of falls: Patient Identification Verified: Yes Signs or symptoms of abuse/neglect since last visito No Secondary Verification Process Completed: Yes Hospitalized since last visit: No Patient Has Alerts: Yes Implantable device outside of the clinic excluding No Patient Alerts: L ABI =1.12, TBI = .92 cellular tissue based products placed in the center R ABI= 1.02, TBI= .58 since last visit: Has Dressing in Place as Prescribed: Yes Has Compression in Place as Prescribed: Yes Pain Present Now: No Electronic Signature(s) Signed: 02/27/2020 5:31:38 PM By: Levan Hurst RN, BSN Entered By: Levan Hurst on 02/24/2020 10:04:36 -------------------------------------------------------------------------------- Compression Therapy Details Patient Name: Date of Service: [name], [name] 02/24/2020 10:00 A M Medical Record Number: [id number] Patient Account Number: [id number] Date of Birth/Sex: Treating RN: Mar 16, 1956 (64 y.o. [name] Primary Care Armonie Mettler: [name] Other Clinician: Referring Kellis Mcadam: Treating [name]/Extender: [name] in Treatment: 5 Compression Therapy Performed for Wound  Assessment: Wound #15 Left,Medial Malleolus Performed By: Clinician Baruch Gouty, RN Compression Type: Rolena Infante Post Procedure Diagnosis Same as Pre-procedure Electronic Signature(s) Signed: 02/24/2020 5:26:06 PM By: Kela Millin Entered By: Kela Millin on 02/24/2020 10:43:51 -------------------------------------------------------------------------------- Encounter Discharge Information Details Patient Name: Date of Service: [name], [name] 02/24/2020 10:00 A M Medical Record Number: [id number] Patient Account Number: [id number] Date of Birth/Sex: Treating RN: 1955/10/30 (64 y.o. Elam [name] Primary Care Billiejean Schimek: [name] Other Clinician: Referring Velva Molinari: Treating [name]/Extender: [name] in Treatment: 5 Encounter Discharge Information Items Post Procedure Vitals Discharge Condition: Stable Temperature (F): 98.4 Ambulatory Status: Walker Pulse (bpm): 64 Discharge Destination: Home Respiratory Rate (breaths/min): 18 Transportation: Private Auto Blood Pressure (mmHg): 127/71 Accompanied By: self Schedule Follow-up Appointment: Yes Clinical Summary of Care: Patient Declined Electronic Signature(s) Signed: 02/24/2020 4:39:15 PM By: Baruch Gouty RN, BSN Entered By: Baruch Gouty on 02/24/2020 11:13:41 -------------------------------------------------------------------------------- Lower Extremity Assessment Details Patient Name: Date of Service: [name], [name] 02/24/2020 10:00 A M Medical Record Number: [id number] Patient Account Number: [id number] Date of Birth/Sex: Treating RN: 1955-12-22 (64 y.o. [name] Primary Care Birgitta Uhlir: [name] Other Clinician: Referring Devinne Epstein: Treating [name]/Extender: [name] in Treatment: 5 Edema Assessment Assessed: [Left: No] [Right: No] Edema: [Left: Ye] [Right: s] Calf Left: Right: Point of Measurement: cm From  Medial Instep 26 cm cm Ankle Left: Right: Point of Measurement: cm From Medial Instep 20.5 cm cm Vascular Assessment Pulses: Dorsalis Pedis Palpable: [Left:Yes] Electronic Signature(s) Signed: 02/27/2020 5:31:38 PM By: Levan Hurst RN, BSN Entered By: Levan Hurst on 02/24/2020 10:18:27 -------------------------------------------------------------------------------- Multi Wound Chart Details Patient Name: Date of Service: [name], [name] 02/24/2020 10:00 A M Medical Record Number: [id number] Patient Account Number: [id number] Date of Birth/Sex: Treating RN: Jun 15, 1955 (64 y.o. F) Kela Millin Primary Care Rolanda Campa: Lennie [name] Other Clinician: Referring [name]:  Treating [name]/Extender: [name] in Treatment: 5 Vital Signs Height(in): 68 Pulse(bpm): 80 Weight(lbs): Blood Pressure(mmHg): 127/71 Body Mass Index(BMI): Temperature(F): 98.4 Respiratory Rate(breaths/min): 16 Photos: [15:No Photos Left, Medial Malleolus] [N/A:N/A N/A] Wound Location: [15:Gradually Appeared] [N/A:N/A] Wounding Event: [15:Venous Leg Ulcer] [N/A:N/A] Primary Etiology: [15:Peripheral Venous Disease,] [N/A:N/A] Comorbid History: [15:Osteoarthritis, Confinement Anxiety 12/30/2019] [N/A:N/A] Date Acquired: [15:5] [N/A:N/A] Weeks of Treatment: [15:Open] [N/A:N/A] Wound Status: [15:6.1x2.9x1.2] [N/A:N/A] Measurements L x W x D (cm) [15:13.894] [N/A:N/A] A (cm) : rea [15:16.672] [N/A:N/A] Volume (cm) : [15:-19.10%] [N/A:N/A] % Reduction in A [15:rea: -78.70%] [N/A:N/A] % Reduction in Volume: [15:Full Thickness With Exposed Support N/A] Classification: [15:Structures Medium] [N/A:N/A] Exudate A mount: [15:Serosanguineous] [N/A:N/A] Exudate Type: [15:red, brown] [N/A:N/A] Exudate Color: [15:Flat and Intact] [N/A:N/A] Wound Margin: [15:Large (67-100%)] [N/A:N/A] Granulation A mount: [15:Red, Pink] [N/A:N/A] Granulation Quality: [15:Small (1-33%)]  [N/A:N/A] Necrotic A mount: [15:Fat Layer (Subcutaneous Tissue): Yes N/A] Exposed Structures: [15:Tendon: Yes Fascia: No Muscle: No Joint: No Bone: No Small (1-33%)] [N/A:N/A] Epithelialization: [15:Debridement - Excisional] [N/A:N/A] Debridement: Pre-procedure Verification/Time Out 10:45 [N/A:N/A] Taken: [15:Other] [N/A:N/A] Pain Control: [15:Subcutaneous] [N/A:N/A] Tissue Debrided: [15:Skin/Subcutaneous Tissue] [N/A:N/A] Level: [15:1] [N/A:N/A] Debridement A (sq cm): [15:rea Curette] [N/A:N/A] Instrument: [15:Minimum] [N/A:N/A] Bleeding: [15:Pressure] [N/A:N/A] Hemostasis A chieved: [15:0] [N/A:N/A] Procedural Pain: [15:0] [N/A:N/A] Post Procedural Pain: [15:Procedure was tolerated well] [N/A:N/A] Debridement Treatment Response: [15:6.1x2.9x1.2] [N/A:N/A] Post Debridement Measurements L x W x D (cm) [15:16.672] [N/A:N/A] Post Debridement Volume: (cm) [15:Compression Therapy] [N/A:N/A] Procedures Performed: [15:Debridement] Treatment Notes Wound #15 (Left, Medial Malleolus) 2. Periwound Care Moisturizing lotion 3. Primary Dressing Applied Collegen AG 4. Secondary Dressing ABD Pad Dry Gauze 6. Support Layer Best boy) Signed: 02/24/2020 5:26:06 PM By: Kela Millin Signed: 02/26/2020 6:30:39 AM By: Linton Ham MD Entered By: Linton Ham on 02/24/2020 11:14:17 -------------------------------------------------------------------------------- Multi-Disciplinary Care Plan Details Patient Name: Date of Service: [name], [name] 02/24/2020 10:00 A M Medical Record Number: [id number] Patient Account Number: [id number] Date of Birth/Sex: Treating RN: 06/03/1955 (64 y.o. [name] Primary Care Adams Hinch: [name] Other Clinician: Referring Neizan Debruhl: Treating Wayland Baik/Extender: [name] in Treatment: 5 Active Inactive Abuse / Safety / Falls / Self Care Management Nursing  Diagnoses: Potential for falls Potential for injury related to falls Goals: Patient will not experience any injury related to falls Date Initiated: 01/17/2020 Target Resolution Date: 03/09/2020 Goal Status: Active Patient/caregiver will verbalize/demonstrate measures taken to prevent injury and/or falls Date Initiated: 01/17/2020 Target Resolution Date: 03/09/2020 Goal Status: Active Interventions: Assess Activities of Daily Living upon admission and as needed Assess fall risk on admission and as needed Assess: immobility, friction, shearing, incontinence upon admission and as needed Assess impairment of mobility on admission and as needed per policy Assess personal safety and home safety (as indicated) on admission and as needed Assess self care needs on admission and as needed Provide education on fall prevention Provide education on personal and home safety Notes: Wound/Skin Impairment Nursing Diagnoses: Impaired tissue integrity Knowledge deficit related to ulceration/compromised skin integrity Goals: Patient/caregiver will verbalize understanding of skin care regimen Date Initiated: 01/17/2020 Target Resolution Date: 03/09/2020 Goal Status: Active Ulcer/skin breakdown will have a volume reduction of 30% by week 4 Date Initiated: 01/17/2020 Target Resolution Date: 03/09/2020 Goal Status: Active Interventions: Assess patient/caregiver ability to obtain necessary supplies Assess patient/caregiver ability to perform ulcer/skin care regimen upon admission and as needed Assess ulceration(s) every visit Provide education on ulcer and skin care Notes: Electronic Signature(s) Signed: 02/24/2020 5:26:06 PM By: Verita Schneiders,  Larene Beach Entered By: Kela Millin on 02/24/2020 09:59:59 -------------------------------------------------------------------------------- Pain Assessment Details Patient Name: Date of Service: [facility] [name], [name] 02/24/2020 10:00 A M Medical Record Number:  [id number] Patient Account Number: [id number] Date of Birth/Sex: Treating RN: 07/16/1955 (64 y.o. [name] Primary Care Ludie Pavlik: [name] Other Clinician: Referring Laroy Mustard: Treating Jerimy Johanson/Extender: [name] in Treatment: 5 Active Problems Location of Pain Severity and Description of Pain Patient Has Paino No Site Locations Pain Management and Medication Current Pain Management: Electronic Signature(s) Signed: 02/27/2020 5:31:38 PM By: Levan Hurst RN, BSN Entered By: Levan Hurst on 02/24/2020 10:05:04 -------------------------------------------------------------------------------- Patient/Caregiver Education Details Patient Name: Date of Service: [name], [name] 9/24/2021andnbsp10:00 A M Medical Record Number: [id number] Patient Account Number: [id number] Date of Birth/Gender: Treating RN: 1956/04/10 (64 y.o. [name] Primary Care Physician: [name] Other Clinician: Referring Physician: Treating Physician/Extender: [name] in Treatment: 5 Education Assessment Education Provided To: Patient Education Topics Provided Wound/Skin Impairment: Handouts: Caring for Your Ulcer Methods: Explain/Verbal Responses: State content correctly Electronic Signature(s) Signed: 02/24/2020 5:26:06 PM By: Kela Millin Entered By: Kela Millin on 02/24/2020 10:00:17 -------------------------------------------------------------------------------- Wound Assessment Details Patient Name: Date of Service: [name], [name] 02/24/2020 10:00 A M Medical Record Number: [id number] Patient Account Number: [id number] Date of Birth/Sex: Treating RN: 05-26-1956 (64 y.o. [name] Primary Care Lasonya Hubner: [name] Other Clinician: Referring Terral Cooks: Treating Ankith Edmonston/Extender: [name] in Treatment: 5 Wound Status Wound Number: 15 Primary Venous  Leg Ulcer Etiology: Wound Location: Left, Medial Malleolus Wound Status: Open Wounding Event: Gradually Appeared Comorbid Peripheral Venous Disease, Osteoarthritis, Confinement Date Acquired: 12/30/2019 History: Anxiety Weeks Of Treatment: 5 Clustered Wound: No Wound Measurements Length: (cm) 6.1 Width: (cm) 2.9 Depth: (cm) 1.2 Area: (cm) 13.894 Volume: (cm) 16.672 % Reduction in Area: -19.1% % Reduction in Volume: -78.7% Epithelialization: Small (1-33%) Tunneling: No Undermining: No Wound Description Classification: Full Thickness With Exposed Support Structures Wound Margin: Flat and Intact Exudate Amount: Medium Exudate Type: Serosanguineous Exudate Color: red, brown Foul [name] After Cleansing: No Slough/Fibrino Yes Wound Bed Granulation Amount: Large (67-100%) Exposed Structure Granulation Quality: Red, Pink Fascia Exposed: No Necrotic Amount: Small (1-33%) Fat Layer (Subcutaneous Tissue) Exposed: Yes Necrotic Quality: Adherent Slough Tendon Exposed: Yes Muscle Exposed: No Joint Exposed: No Bone Exposed: No Treatment Notes Wound #15 (Left, Medial Malleolus) 2. Periwound Care Moisturizing lotion 3. Primary Dressing Applied Collegen AG 4. Secondary Dressing ABD Pad Dry Gauze 6. Support Layer Best boy) Signed: 02/27/2020 5:31:38 PM By: Levan Hurst RN, BSN Entered By: Levan Hurst on 02/24/2020 10:17:53 -------------------------------------------------------------------------------- Vitals Details Patient Name: Date of Service: [name], [name] 02/24/2020 10:00 A M Medical Record Number: [id number] Patient Account Number: [id number] Date of Birth/Sex: Treating RN: 1955-06-26 (64 y.o. [name] Primary Care Evonte Prestage: [name] Other Clinician: Referring Zoi Devine: Treating Deatra Mcmahen/Extender: [name] in Treatment: 5 Vital Signs Time Taken: 10:04 Temperature (F):  98.4 Height (in): 68 Pulse (bpm): 64 Respiratory Rate (breaths/min): 16 Blood Pressure (mmHg): 127/71 Reference Range: 80 - 120 mg / dl Electronic Signature(s) Signed: 02/27/2020 5:31:38 PM By: Levan Hurst RN, BSN Entered By: Levan Hurst on 02/24/2020 10:04:57

## 2020-03-02 ENCOUNTER — Ambulatory Visit (INDEPENDENT_AMBULATORY_CARE_PROVIDER_SITE_OTHER): Payer: Medicare Other | Admitting: Podiatry

## 2020-03-02 ENCOUNTER — Encounter: Payer: Self-pay | Admitting: Podiatry

## 2020-03-02 ENCOUNTER — Other Ambulatory Visit: Payer: Self-pay

## 2020-03-02 DIAGNOSIS — L97322 Non-pressure chronic ulcer of left ankle with fat layer exposed: Secondary | ICD-10-CM

## 2020-03-02 DIAGNOSIS — R600 Localized edema: Secondary | ICD-10-CM

## 2020-03-02 DIAGNOSIS — Z9889 Other specified postprocedural states: Secondary | ICD-10-CM

## 2020-03-02 NOTE — Progress Notes (Signed)
   Subjective:  Patient presents today status post tibiotalar calcaneal arthrodesis left lower extremity. DOS: 12/15/2019. Patient has been seeing [facility] weekly for wound care to the medial aspect of the ankle opposite the incision site.  She states that there has been improvement of the medial ankle wound and they are applying collagen or Hydrofera Blue with Unna boot compression wraps weekly.  She presents for further treatment and evaluation and follow-up.   Past Medical History:  Diagnosis Date  . Allergy   . Ankle wound 02/2016   bilateral  . Dental bridge present    lower  . Dental crowns present    upper right  . Open wounds involving multiple regions of lower extremity   . Peripheral vascular disease (HCC)    venous stasis ulcers bilateral lower extremities  . PONV (postoperative nausea and vomiting)          Objective/Physical Exam Skin incision to the lateral ankle appears to be healthy and well coapted and healed. Lateral incision is completely healed.  The distal portion of the incision did have some delayed healing but today it appears completely healed.  Medial aspect of the ankle demonstrates a healthy granular wound base with some necrotic breed to the distal portion.  Please see attached image.  Wound base is mostly granular with serosanguineous drainage noted.  The distal portion of the ulcer does appear to be deeper..  No malodor noted.  There is diffuse edema throughout the foot. Otherwise the foot is in a rectus position with good alignment of the foot in relationship to the leg.  The foot is in an almost 90 degree angle.  Assessment: 1. s/p tibiotalar calcaneal arthrodesis left. DOS: 12/15/2019 2.  Postsurgical edema left  3.  Ulcer right medial ankle secondary to venous insufficiency 4.  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 Patient was evaluated.  2.  Continue management of wound ulcers to the medial aspect of the  ankle at the wound care center. 3.  Hydrofera Blue and multilayer Unna boot compression was applied to the calf. Puracol Plus collagen wound dressing was applied directly to the ulcers 4.  Return to clinic in 4 weeks for follow-up x-ray.  At this time the patient will have been nonweightbearing for approximately 12 weeks and we may initiate some mild weightbearing with physical therapy  Edrick Kins, DPM [facility]  Dr. Edrick Kins, DPM    [address], [place] [address]                Office [phone]  Fax [phone]

## 2020-03-05 ENCOUNTER — Encounter (HOSPITAL_BASED_OUTPATIENT_CLINIC_OR_DEPARTMENT_OTHER): Payer: Medicare Other | Attending: Internal Medicine | Admitting: Internal Medicine

## 2020-03-05 DIAGNOSIS — L97828 Non-pressure chronic ulcer of other part of left lower leg with other specified severity: Secondary | ICD-10-CM | POA: Diagnosis present

## 2020-03-05 DIAGNOSIS — I87332 Chronic venous hypertension (idiopathic) with ulcer and inflammation of left lower extremity: Secondary | ICD-10-CM | POA: Diagnosis not present

## 2020-03-05 DIAGNOSIS — L97328 Non-pressure chronic ulcer of left ankle with other specified severity: Secondary | ICD-10-CM | POA: Insufficient documentation

## 2020-03-05 DIAGNOSIS — L03116 Cellulitis of left lower limb: Secondary | ICD-10-CM | POA: Diagnosis not present

## 2020-03-05 NOTE — Progress Notes (Signed)
ZUNAIRA, LAMY ([id number]) Visit Report for 03/05/2020 Debridement Details Patient Name: Date of Service: [facility] [name], [name] 03/05/2020 9:00 A M Medical Record Number: [id number] Patient Account Number: [id number] Date of Birth/Sex: Treating RN: Oct 10, 1955 (64 y.o. [name] Provider: [name] Other Clinician: Referring Provider: Treating Provider/Extender: [name] in Treatment: 6 Debridement Performed for Assessment: Wound #15 Left,Medial Malleolus Performed By: Physician [name]., MD Debridement Type: Debridement Severity of Tissue Pre Debridement: Fat layer exposed Level of Consciousness (Pre-procedure): Awake and Alert Pre-procedure Verification/Time Out Yes - 09:42 Taken: Start Time: 09:42 T Area Debrided (L x W): otal 1.3 (cm) x 1 (cm) = 1.3 (cm) Tissue and other material debrided: Viable, Non-Viable, Slough, Subcutaneous, Slough Level: Skin/Subcutaneous Tissue Debridement Description: Excisional Instrument: Curette Bleeding: Minimum Hemostasis Achieved: Pressure End Time: 09:44 Procedural Pain: 6 Post Procedural Pain: 4 Response to Treatment: Procedure was tolerated well Level of Consciousness (Post- Awake and Alert procedure): Post Debridement Measurements of Total Wound Length: (cm) 6 Width: (cm) 3 Depth: (cm) 0.7 Volume: (cm) 9.896 Character of Wound/Ulcer Post Debridement: Improved Severity of Tissue Post Debridement: Fat layer exposed Post Procedure Diagnosis Same as Pre-procedure Electronic Signature(s) Signed: 03/05/2020 4:54:01 PM By: Linton Ham MD Signed: 03/05/2020 5:03:51 PM By: Baruch Gouty RN, BSN Entered By: Linton Ham on 03/05/2020 10:20:24 -------------------------------------------------------------------------------- HPI Details Patient Name: Date of Service: [name], [name] 03/05/2020 9:00 A M Medical Record Number: [id number] Patient Account Number:  [id number] Date of Birth/Sex: Treating RN: [birth year] (64 y.o. [name] Provider: [name] Other Clinician: Referring Provider: Treating Provider/Extender: [name] in Treatment: 6 History of Present Illness HPI Description: the remaining wound is over the left medial ankle. Similar wound over the right medial ankle healed largely with use of Apligraf. Most recently we have been using Hydrofera Blue over this wound with considerable improvement. The patient has been extensively worked up in the past for her venous insufficiency and she is not a candidate for antireflux surgery although I have none of the details available currently. 08/24/14; considerable improvement today. About 50% of this wound areas now epithelialized. The base of the wound appears to be healthier granulation.as opposed to last week when she had deteriorated a considerable improvement 08/17/14; unfortunately the wound has regressed somewhat. The areas of epithelialization from the superior aspect are not nearly as healthy as they were last week. The patient thinks her Hydrofera Blue slipped. 09/07/14; unfortunately the area has markedly regressed in the 2 weeks since I've seen this. There is an odor surrounding erythema. The healthy granulation tissue that we had at the base of the wound now is a dusky color. The nurse reports green drainage 09/14/14; the area looks somewhat better than last week. There is less erythema and less drainage. The culture I did did not show any growth. Nevertheless I think it is better to continue the Cipro and doxycycline for a further week. The remaining wound area was debridement. 09/21/14. Wound did not require debridement last week. Still less erythema and less drainage. She can complete her antibiotics. The areas of epithelialization in the superior aspect of the wound do not look as healthy as they did some weeks ago 10/05/14 continued  improvement in the condition of this wound. There is advancing epithelialization. Less aggressive debridement required 10/19/14 continued improvement in the condition and volume of this wound. Less aggressive debridement to the inferior part of this to  remove surface slough and fibrinous eschar 11/02/14 no debridement is required. The surface granulation appears healthy although some of her islands of epithelialization seem to have regressed. No evidence of infection 11/16/14; lites surface debridement done of surface eschar. The wound does not look to be unhealthy. No evidence of infection. Unfortunately the patient has had podiatry issues in the right foot and for some reason has redeveloped small surface ulcerations in the medial right ankle. Her original presentation involved wounds in this area 11/23/14 no debridement. The area on the right ankle has enlarged. The left ankle wound appears stable in terms of the surface although there is periwound inflammation. There has been regression in the amount of new skin 11/30/14 no debridement. Both wound areas appear healthy. There was no evidence of infection. The the new area on the right medial ankle has enlarged although that both the surfaces appear to be stable. 12/07/14; Debridement of the right medial ankle wound. No no debridement was done on the left. 12/14/14 no major change in and now bilateral medial ankle wounds. Both of these are very painful but the no overt evidence of infection. She has had previous venous ablation 12/21/14; patient states that her right medial ankle wound is considerably more painful last week than usual. Her left is also somewhat painful. She could not tolerate debridement. The right medial ankle wound has fibrinous surface eschar 12/28/14 this is a patient with severe bilateral venous insufficiency ulcers. For a considerable period of time we actually had the one on the right medial ankle healed however this recently opened  up again in June. The left medial ankle wound has been a refractory area with some absent flows. We had some success with Hydrofera Blue on this area and it literally closed by 50% however it is recently opened up Foley. Both of these were debridement today of surface eschar. She tolerates this poorly 01/25/15: No change in the status of this. Thick adherent escar. Very poor tolerance of any attempt at debridement. I had healed the right medial malleolus wound for a considerable amount of time and had the left one down to about 50% of the volume although this is totally regressed over the last 48 weeks. Further the right leg has reopened. she is trying to make a appointment with pain and vascular, previous ablations with Dr. Aleda Grana. I do not believe there is an arterial insufficiency issue here 02/01/15 the status of the adherent eschar bilaterally is actually improved. No debridement was done. She did not manage to get vascular studies done 02/08/15 continued debridement of the area was done today. The slough is less adherent and comes off with less pressure. There is no surrounding infection peripheral pulses are intact 02/15/15 selective debridement with a disposable curette. Again the slough is less adherent and comes off with less difficulty. No surrounding infection peripheral pulses are intact. 02/22/15 selective debridement of the right medial ankle wound. Slough comes off with less difficulty. No obvious surrounding infection peripheral pulses are intact I did not debridement the one on the left. Both of these are stable to improved 03/01/15 selective debridement of both wound areas using a curette to. Adherent slough cup soft with less difficulty. No obvious surrounding infection. The patient tells me that 2 days ago she noted a rash above the right leg wrap. She did not have this on her lower legs when she change this over she arrives with widespread left greater than right almost  folliculitis-looking rash which is extremely pruritic.  I don't see anything to culture here. There is no rash on the rest of her body. She feels well systemically. 03/08/15; selective debridement of both wounds using a curette. Base of this does not look unhealthy. She had limegreen drainage coming out of the left leg wound and describes a lot of drainage. The rash on her left leg looks improved to. No cultures were done. 03/22/15; patient was not here last week. Basal wounds does not look healthy and there is no surrounding erythema. No drainage. There is still a rash on the left leg that almost looks vasculitic however it is clearly limited to the top of where the wrap would be. 04/05/15; on the right required a surgical debridement of surface eschar and necrotic subcutaneous tissue. I did not debridement the area on the left. These continue to be large open wounds that are not changing that much. We were successful at one point in healing the area on the right, and at the same time the area on the left was roughly half the size of current measurements. I think a lot of the deterioration has to do with the prolonged time the patient is on her feet at work 04/19/15 I attempted-like surface debridement bilaterally she does not tolerate this. She tells me that she was in allergic care yesterday with extreme pain over her left lateral malleolus/ankle and was told that she has an "sprain" 05/03/15; large bilateral venous insufficiency wounds over the medial malleolus/medial aspect of her ankles. She complains of copious amounts of drainage and his usual large amounts of pain. There is some increasing erythema around the wound on the right extending into the medial aspect of her foot to. historically she came in with these wounds the right one healed and the left one came down to roughly half its current size however the right one is reopened and the left is expanded. 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 05/10/15 large bilateral venous insufficiency wounds. There is less adherence surface left however the surface culture that I did last week grew pseudomonas therefore bilateral selective debridement score necessary. There is surrounding erythema. The patient describes severe bilateral drainage and a lot of pain in the left ankle. Apparently her podiatrist was were ready to do a cortisone shot 05/17/15; the patient complains of pain and again copious amounts of drainage. 05/24/15; we used Iodo flex last week. Patient notes considerable improvement in wound drainage. Only needed to change this once. 05/31/15; we continued Iodoflex; the base of these large wounds bilaterally is not too bad but there is probably likely a significant bioburden here. I would like to debridement just doesn't tolerate it. 06/06/14 I would like to continue the Iodoflex although she still hasn't managed to obtain supplies. She has bilateral medial malleoli or large wounds which are mostly superficial. Both of them are covered circumferentially with some nonviable fibrinous slough although she tolerates debridement very poorly. She apparently has an appointment for an ablation on the right leg by interventional radiology. 06/14/15; the patient arrives with the wounds and static condition. We attempted a debridement although she does not do well with this secondary to pain. I 07/05/15; wounds are not much smaller however there appears to be a cleaner granulating base. The left has tight fibrinous slough greater than the right. Debridement is tolerated poorly due to pain. Iodoflex is done more for these wounds in any of the multitude  of different dressings I have tried on the left 1 and then subsequently the right. 07/12/15; no change in the condition of this wound. I am able to do an aggressive debridement on the right but not the left. She simply cannot tolerate it. We have been  using Iodoflex which helps somewhat. It is worthwhile remembering that at one point we healed the right medial ankle wound and the left was about 25% of the current circumference. We have suggested returning to vascular surgery for review of possible further ablations for one reason or another she has not been able to do this. 07/26/15 no major change in the condition of either wound on her medial ankle. I did not attempt to debridement of these. She has been aggressively scrubbing these while she is in the shower at home. She has her supply of Iodoflex which seems to have done more for these wounds then anything I have put on recently. 08/09/15 wound area appears larger although not verified by measurements. Using Iodoflex 09/05/2015 -- she was here for avisit today but had significant problems with the wound and I was asked to see her for a physician opinion. I have summarize that this lady has had surgery on her left lower extremity about 10 years ago where the possible veins stripping was done. She has had an opinion from interventional radiology around November 2016 where no further sclerotherapy was ordered. The patient works 12 hours a day and stands on a concrete floor with work boots and is unable to get the proper compression she requires and cannot elevate her limbs appropriately at any given time. She has recently grown Pseudomonas from her wound culture but has not started her ciprofloxacin which was called in for her. 09/13/15 this continues to be a difficult situation for this patient. At one point I had this wound down to a 1.5 x 1.5" wound on her left leg. This is deteriorated and the right leg has reopened. She now has substantial wounds on her medial calcaneus, malleoli and into her lower leg. One on the left has surface eschar but these are far too painful for me to debridement here. She has a vascular surgery appointment next week to see if anything can be done to help here. I  think she has had previous ablations several years ago at [facility]. She has no major edema. She tells me that she did not get product last time [facility]] and went for several days without it. She continues to work in work boots 12 hours a day. She cannot get compression/4-layer under her work boots. 09/20/15 no major change. Periwound edema control was not very good. Her point with pain and vascular is next Wednesday the 25th 09/28/15; the patient is seen vascular surgery and is apparently scheduled for repeat duplex ultrasounds of her bilateral lower legs next week. 10/05/15; the patient was seen by Dr. Doren Custard of vascular surgery. He feels that she should have arterial insufficiency excluded as cause/contributed to her nonhealing stage she is therefore booked for an arteriogram. She has apparently monophasic signals in the dorsalis pedis pulses. She also of course has known severe chronic venous insufficiency with previous procedures as noted previously. I had another long discussion with the patient today about her continuing to work 12 hour shifts. I've written her out for 2 months area had concerns about this as her work location is currently undergoing significant turmoil and this may lead to her termination. She is aware of this however  I agree with her that she simply cannot continue to stand for 12 hours multiple days a week with the substantial wound areas she has. 10/19/15; the Dr. Doren Custard appointment was largely for an arteriogram which was normal. She does not have an arterial issue. He didn't make a comment about her chronic venous insufficiency for which she has had previous ablations. Presumably it was not felt that anything additional could be done. The patient is now out of work as I prescribed 2 weeks ago. Her wounds look somewhat less aggravated presumably because of this. I felt I would give debridement another try today 10/25/15; no major change in this patient's wounds. We are  struggling to get her product that she can afford into her own home through her insurance. 11/01/15; no major change in the patient's wounds. I have been using silver alginate as the most affordable product. I spoke to Dr. Marla Roe last week with her requested take her to the OR for surgical debridement and placement of ACEL. Dr. Marla Roe told me that she would be willing to do this however [facility] will not cover this, fortunately the patient has [place] healthcare of some variant 11/08/15; no major change in the patient's wounds. She has been completely nonviable surface that this but is in too much pain with any attempted debridement are clinic. I have arranged for her to see Dr. Marla Roe ham of plastic surgery and this appointment is on Monday. I am hopeful that they will take her to the OR for debridement, possible ACEL ultimately possible skin graft 11/22/15 no major change in the patient's wounds over her bilateral medial calcaneus medial malleolus into the lower legs. Surface on these does not look too bad however on the left there is surrounding erythema and tenderness. This may be cellulitis or could him sleepy tinea. 11/29/15; no major changes in the patient's wounds over her bilateral medial malleolus. There is no infection here and I don't think any additional antibiotics are necessary. There is now plan to move forward. She sees Dr. Marla Roe in a week's time for preparation for operative debridement and ACEL placement I believe on 7/12. She then has a follow-up appointment with Dr. Marla Roe on 7/21 12/28/15; the patient returns today having been taken to the [place] by Dr. Marla Roe 12/12/15 she underwent debridement, intraoperative cultures [which were negative]. She had placement of a wound VAC. Parent really ACEL was not available to be placed. The wound VAC foam apparently adhered to the wound since then she's been using silver alginate, Xeroform under Ace wraps. She still  says there is a lot of drainage and a lot of pain 01/31/16; this is a patient I see monthly. I had referred her to Dr. Marla Roe him of plastic surgery for large wounds on her bilateral medial ankles. She has been to the OR twice once in early July and once in early August. She tells me over the last 3 weeks she has been using the wound VAC with ACEL underneath it. On the right we've simply been using silver alginate. Under Kerlix Coban wraps. 02/28/16; this is a patient I'm currently seeing monthly. She is gone on to have a skin graft over her large venous insufficiency ulcer on the left medial ankle. This was done by Dr. Marla Roe him. The patient is a bit perturbed about why she didn't have one on her right medial ankle wound. She has been using silver alginate to this. 03/06/16; I received a phone call from her plastic  surgery Dr. Marla Roe. She expressed some concern about the viability of the skin graft she did on the left medial ankle wound. Asked me to place Endoform on this. She told me she is not planning to do a subsequent skin graft on the right as the left one did not take very well. I had placed Hydrofera Blue on the right 03/13/16; continue to have a reasonably healthy wound on the right medial ankle. Down to 3 mm in terms of size. There is epithelialization here. The area on the left medial ankle is her skin graft site. I suppose the last week this looks somewhat better. She has an open area inferiorly however in the center there appears to be some viable tissue. There is a lot of surface callus and eschar that will eventually need to come off however none of this looked to be infected. Patient states that the is able to keep the dressing on for several days which is an improvement. 03/20/16 no major change in the circumference of either wound however on the left side the patient was at Dr. Eusebio Friendly office and they did a debridement of left wound. 50% of the wound seems to be  epithelialized. I been using Endoform on the left Hydrofera Blue in the right 03/27/16; she arrives today with her wound is not looking as healthy as they did last week. The area on the right clearly has an adherent surface to this a very similar surface on the left. Unfortunately for this patient this is all too familiar problem. Clearly the Endoform is not working and will need to change that today that has some potential to help this surface. She does not tolerate debridement in this clinic very well. She is changing the dressing wants 04/03/16; patient arrives with the wounds looking somewhat better especially on the right. Dr. Migdalia Dk change the dressing to silver alginate when she saw her on Monday and also sold her some compression socks. The usefulness of the latter is really not clear and woman with severely draining wounds. 04/10/16; the patient is doing a bit of an experiment wearing the compression stockings that Dr. Migdalia Dk provided her to her left leg and the out of legs based dressings that we provided to the right. 05/01/16; the patient is continuing to wear compression stockings Dr. Migdalia Dk provided her on the left that are apparently silver impregnated. She has been using Iodoflex to the right leg wound. Still a moderate amount of drainage, when she leaves here the wraps only last for 4 days. She has to change the stocking on the left leg every night 05/15/16; she is now using compression stockings bilaterally provided by Dr. Marla Roe. She is wearing a nonadherent layer over the wounds so really I don't think there is anything specific being done to this now. She has some reduction on the left wound. The right is stable. I think all healing here is being done without a specific dressing 06/09/16; patient arrives here today with not much change in the wound certainly in diameter to large circular wounds over the medial aspect of her ankle bilaterally. Under the light of these services are  certainly not viable for healing. There is no evidence of surrounding infection. She is wearing compression stockings with some sort of silver impregnation as prescribed by Dr. Marla Roe. She has a follow-up with her tomorrow. 06/30/16; no major change in the size or condition of her wounds. These are still probably covered with a nonviable surface. She is using only  her purchase stockings. She did see Dr. Marla Roe who seemed to want to apply Dakin's solution to this I'm not extreme short what value this would be. I would suggest Iodoflex which she still has at home. 07/28/16; I follow Mrs. Klee episodically along with Dr. Marla Roe. 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 She has been applying a topical collagen ointment to both wounds with Adaptic. I don't think Dr. Marla Roe is planning to take her back to the OR. 08/19/16; I follow Mrs. Jeneen Rinks on a monthly basis along with Dr. Marla Roe of plastic surgery. 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 I been following her for a number of years. At one point I was able to get the right medial malleolus wound to heal and had the left medial malleolus down to about half its current size however and I had to send her to plastic surgery for an operative debridement. Since then things have been stable to slightly improve the area on the right is slightly better one in the left about the same although there is much less adherent surface than I'm used to with this patient. She is using some form of liquid collagen gel that Dr. Marla Roe provided a Kerlix cover with the patient's own pressure stockings. She tells me that she has extreme pain in both ankles and along the lateral aspect of both feet. She has been unable to work for some period of time. She is telling me she is retiring at the beginning of April. She sees Dr.  Doran Durand of orthopedics next week 09/22/16; patient has not seen Dr. Marla Roe since the last time she is here. I'm not really sure what she is using to the wounds other than bits and pieces of think she had left over including most recently Hydrofera Blue. She is using juxtalite stockings. She is having difficulty with her husband's recent illness "stroke". She is having to transport him to various doctors appointments. Dr. Marla Roe left her the option of a repeat debridement with ACEL however she has not been able to get the time to follow-up on this. She continues to have a fair amount of drainage out of these wounds with certainly precludes leaving dressings on all week 10/13/16; patient has not seen Dr. Marla Roe since she was last in our clinic. I'm not really sure what she is doing with the wounds, we did try to get her [facility] and I think she is actually using this most of the time. Because of drainage she states she has to change this every second day although this is an improvement from what she used to do. She went to see Dr. Doran Durand who did not think she had a muscular issue with regards to her feet, he referred her to a neurologist and I think the appointment is sometime in June. I changed her back to Iodoflex which she has used in the past but not recently. 11/03/16; the patient has been using Iodoflex although she ran out of this. Still claims that there is a lot of drainage although the wound does not look like this. No surrounding erythema. She has not been back to see Dr. Marla Roe 11/24/16; the patient has been using Iodoflex again but she ran out of it 2 or 3 days ago. There is no major change in the condition of either one of these wounds in fact they are larger and covered  in a thick adherent surface slough/nonviable tissue especially on the left. She does not tolerate mechanical debridement in our clinic. Going back to see Dr. Marla Roe of plastic surgery for an operative  debridement would seem reasonable. 12/15/16; the patient has not been back to see Dr. Marla Roe. She is been dealing with a series of illnesses and her husband which of monopolized her time. She is been using Sorbact which we largely supplied. She states the drainage is bad enough that it maximum she can go 2-3 days without changing the dressing 01/12/2017 -- the patient has not been back for about 4 weeks and has not seen Dr. Marla Roe not does she have any appointment pending. 01/23/17; patient has not seen Dr. Marla Roe even though I suggested this previously. She is using Santyl that was suggested last week by Dr. Con Memos this Cost her $16 through her insurance which is indeed surprising 02/12/17; continuing Santyl and the patient is changing this daily. A lot of drainage. She has not been back to see plastic surgery she is using an Ace wrap. Our intake nurse suggested wrap around stockings which would make a good reasonable alternative 02/26/17; patient is been using Santyl and changing this daily due to drainage. She has not been to see plastic surgery she uses in April Ace wrap to control the edema. She did obtain extremitease stockings but stated that the edema in her leg was to big for these 03/20/17; patient is using Santyl and Anasept. Surfaces looked better today the area on the right is actually measuring a little smaller. She has states she has a lot of pain in her feet and ankles and is asking for a consult to pain control which I'll try to help her with through our case manager. 04/10/17; the patient arrives with better-looking wound surfaces and is slightly smaller wound on the left she is using a combination of Santyl and Anasept. She has an appointment or at least as started in the pain control center associated with [facility] 05/14/17; this is a patient who I followed for a prolonged period of time. 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 At one  point I had this down to a much smaller wound on the left however these reopened and we've never been able to get these to heal. She has been using Santyl and Anasept gel although 2 weeks ago she ran out of the Anasept gel. She has a stable appearance of the wound. She is going to the wound care clinic at [facility]. They wanted do a nerve block/spinal block although she tells me she is reluctant to go forward with that. 05/21/17; this is a patient I have followed for many years. 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 Chronic pain and deformity in her ankles as well. She is been to see plastic surgery as well as orthopedics. Using PolyMem AG most recently/Kerramax/ABDs and 2 layer compression. She has managed to keep this on and she is coming in for a nurse check to change the dressing on Tuesdays, we see her on Fridays 06/05/17; really quite a good looking surface and the area especially on the right medial has contracted in terms of dimensions. Well granulated healthy-looking tissue on both sides. Even with an open curet there is nothing that even feels abnormal here. This is as good as I've seen this in quite some time. We have been using PolyMem AG and bringing her in for a nurse check 06/12/17;  really quite good surface on both of these wounds. The right medial has contracted a bit left is not. We've been using PolyMem and AG and she is coming in for a nurse visit 06/19/17; we have been using PolyMem AG and bringing her in for a nurse check. Dimensions of her wounds are not better but the surfaces looked better bilaterally. She complained of bleeding last night and the left wound and increasing pain bilaterally. She states her wound pain is more neuropathic than just the wounds. There was some suggestion that this was radicular from her pain management doctor in talking to her it is really difficult to sort this out. 06/26/17; using PolyMem and AG and bringing her  in for a nurse check as All of this and reasonably stable condition. Certainly not improved. The dimensions on the lateral part of the right leg look better but not really measuring better. The medial aspect on the left is about the same. 07/03/16; we have been using PolyMen AG and bringing her in for a nurse check to change the dressings as the wounds have drainage which precludes once weekly changing. We are using all secondary absorptive dressings.our intake nurse is brought up the idea of using a wound VAC/snap VAC on the wound to help with the drainage to see if this would result in some contraction. This is not a bad idea. The area on the right medial is actually looking smaller. Both wounds have a reasonable-looking surface. There is no evidence of cellulitis. The edema is well controlled 07/10/17; the patient was denied for a snap VAC by her insurance. The major issue with these wounds continues to be drainage. We are using wicked PolyMem AG and she is coming in for a nurse visit to change this. The wounds are stable to slightly improved. The surface looks vibrant and the area on the right certainly has shrunk in size but very slowly 07/17/17; the patient still has large wounds on her bilateral medial malleoli. Surface of both of these wounds looks better. The dimensions seem to come and go but no consistent improvement. There is no epithelialization. We do not have options for advanced treatment products due to insurance issues. They did not approve of the wound VAC to help control the drainage. More recently we've been using PolyMem and AG wicked to allow drainage through. We have been bringing her in for a nurse visit to change this. We do not have a lot of options for wound care products and the home again due to insurance issues 07/24/17; the patient's wound actually looks somewhat better today. No drainage measurements are smaller still healthy-looking surface. We used silver collagen under  PolyMen started last week. We have been bringing her in for a dressing change 07/31/17; patient's wound surface continued to look better and I think there is visible change in the dimensions of the wound on the right. Rims of epithelialization. We have been using silver collagen under PolyMen and bringing her in for a dressing change. There appears to be less drainage although she is still in need of the dressing change 08/07/17. Patient's wound surface continues to look better on both sides and the area on the right is definitely smaller. We have been using silver collagen and PolyMen. She feels that the drainage has been it has been better. I asked her about her vascular status. She went to see Dr. Aleda Grana at [facility] and had some form of ablation. I don't have much detail on this.  I haven't my notes from 2016 that she was not a candidate for any further ablation but I don't have any more information on this. We had referred her to vein and vascular I don't think she ever went. He does not have a history of PAD although I don't have any information on this either. We don't even have ABIs in our record 08/14/17; we've been using silver collagen and PolyMen cover. And putting the patient and compression. She we are bringing her in as a nurse visit to change this because ofarge amount of drainage. We didn't the ABIs in clinic today since they had been done in many moons 1.2 bilaterally. She has been to see vein and vascular however this was at [facility] and she had ablation although I really don't have any information on this all seemed biking get a report. She is also been operatively debrided by plastic surgery and had a cell placed probably 8-12 months ago. This didn't have a major effect. We've been making some gains with current dressings 08/19/17-She is here in follow-up evaluation for bilateral medial malleoli ulcers. She continues to tolerate debridement very poorly. We will continue  with recently changed topical treatment; if no significant improvement may consider switching to Iodosorb/Iodoflex. 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 She has been using silver collagen and PolyMem. I believe she has been used and tried on Iodoflex before. 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 These have been very refractory over time. We have been using silver collagen and PolyMen. She is coming in once a week for a doctor's and once a week for nurse visits. We are actually making some progress 09/18/17; the patient's wounds are smaller especially on the right medial. She arrives today to upset to consider even washing these off with Anasept which I think is been part of the reason this is been closing. We've been using collagen covered in PolyMen otherwise. It is noted that she has a small area of folliculitis on the right medial calf that. As we are wrapping her legs I'll give her a short course of doxycycline to make sure this doesn't amount to anything. She is a long list of complaints today including imbalance, shortness of breath on exertion, inversion of her left ankle. With regards to the latter complaints she is been to see orthopedics and they offered her a tendon release surgery I believe but wanted her wounds to be closed first. I have recommended she go see her primary physician with regards to everything else. 09/25/17; patient's wounds are about the same size. We have made some progress bilaterally although not in recent weeks. She will not allow me T wash these wounds with Anasept even if she is doing her cell. Wheeze we've been using collagen covered in PolyMen. Last week she had a small area of folliculitis this is now opened into a  small wound. She completed 5 days of trimethoprim sulfamethoxazole 10/02/17; unfortunately the area on her left medial ankle is worse with a larger wound area towards the Achilles. The patient complains of a lot of pain. She will not allow debridement although visually I don't think there is anything to debridement in any case. We have been using silver collagen and PolyMen for several months now. Initially  we are making some progress although I'm not really seeing that today. We will move back to [facility]. His admittedly this is a bit of a repeat however I'm hoping that his situation is different now. The patient tells me she had her leg on the left give out on her yesterday this is process some pain. 10/09/17; the patient is seen twice a week largely because of drainage issues coming out of the chronic medial bimalleolar wounds that are chronic. Last week the dimensions of the one on the left looks a little larger I changed her to [facility]. She comes in today with a history of terrible pain in the bilateral wound areas. She will not allow debridement. She will not even allow a tissue culture. There is no surrounding erythema no no evidence of cellulitis. We have been putting her Kerlix Coban man. She will not allow more aggressive compression as there was a suggestion to put her in 3 layer wraps. 10/16/17; large wounds on her bilateral medial malleoli. These are chronic. Not much change from last week. The surface looks have healthy but absolutely no epithelialization. A lot of pain little less so of drainage. She will not allow debridement or even washing these off in the vigorous fashion with Anasept. 10/23/17; large wounds on her bilateral malleoli which are chronic. Some improvement in terms of size perhaps on the right since last time I saw these. She states that after we increased the 3 layer compression there was some bleeding, when she came in for a nurse visit she did not want 3 layer  compression put back on about our nurse managed to convince her. She has known chronic venous visit issues and I'm hoping to get her to tolerate the 3 layer compression. using Hydrofera Blue 10/30/17; absolutely no change in the condition of either wound although we've had some improvement in dimensions on the right.. Attempted to put her in 3 layer compression she didn't tolerated she is back in 2 layer compression. We've been using Hydrofera Blue We looked over her past records. She had venous reflux studies in November 2016. There was no evidence of deep venous reflux on the right. Superficial vein did not show the greater saphenous vein at think this is been previously ablated the small saphenous vein was within normal limits. The left deep venous system showed no DVT the vessels were positive for deep venous reflux in the posterior tibial veins at the ankle. The greater saphenous vein was surgically absent small saphenous vein was within normal limits. She went to vein and vascular at [facility]. I believe she had an ablation on the left greater saphenous vein. I'll update her reflux studies perhaps ever reviewed by vein and vascular. We've made absolutely no progress in these wounds. Will also try to read and TheraSkins through her insurance 11/06/17; W the patient apparently has a 2 week follow-up with vein and vascular I like him to review the whole issue with regards to her previous vascular workup by Dr. Aleda Grana. We've really made no progress on these wounds in many months. She arrives today with less viable looking surface on the left medial ankle wound. This was apparently looking about the same on Tuesday when she was here for nurse visit. 11/13/17; deep tissue culture I did last time of the left lower leg showed multiple organisms without any predominating. In particular no Staphylococcus or group A strep were isolated. We sent her for venous reflux studies. She's had a previous  left greater saphenous vein stripping and I think sclerotherapy of the right greater saphenous vein. She didn't really look at the lesser saphenous vein this both wounds are on the medial aspect. She has reflux in the common femoral vein and popliteal vein and an accessory vein on the right and the common femoral vein and popliteal vein on the left. I'm going to have her go to see vein and vascular just the look over things and see if anything else beside aggressive compression is indicated here. We have not been able to make any progress on these wounds in spite of the fact that the surface of the wounds is never look too bad. 11/20/17; no major change in the condition of the wounds. Patient reports a large amount of drainage. She has a lot of complaints of pain although enlisting her today I wonder if some of this at least his neuropathic rather than secondary to her wounds. She has an appointment with vein and vascular on 12/30/17. The refractory nature of these wounds in my mind at least need vein and vascular to look over the wounds the recent reflux studies we did and her history to see if anything further can be done here. I also note her gait is deteriorated quite a bit. 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 She has a bilateral Trendelenburg gait. I wonder if this is neuropathic or perhaps multilevel radicular. 11/27/17; her wounds actually looks slightly better. Healthy-looking granulation tissue a scant amount of epithelialization. [place] healthcare will not pay for [employer]. They will play for tri layer Oasis and Dermagraft. This is not a diabetic ulcer. We'll try for the tri layer Oasis. She still complains of some drainage. She has a vein and vascular appointment on 12/30/17 12/04/17; the wounds visually look quite good. Healthy-looking granulation with some degree of epithelialization. We are still waiting for response to our request for trial to try layer Oasis. Her  appointment with vascular to review venous and arterial issues isn't sold the end of July 7/31. Not allow debridement or even vigorous cleansing of the wound surface. 12/18/17; slightly smaller especially on the right. 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 We've been using Hydrofera Blue. We still are looking into triple layer Oasis through her insurance 01/08/18 on evaluation today patient's wound actually appears to be showing signs of good improvement at this point in time. She has been tolerating the dressing changes without complication. Fortunately there does not appear to be any evidence of infection at this point in time. We have been utilizing silver nitrate which does seem to be of benefit for her which is also good news. Overall I'm very happy with how things seem to be both regards appearance as well as measurement. Patient did see Dr. Bridgett Larsson for evaluation on 12/30/17. In his assessment he felt that stripping would not likely add much more than chronic compression to the patient's healing process. His recommendation was to follow-up in three months with Dr. Doren Custard if she hasn't healed in order to consider referral back to you and see vascular where she previously was in a trial and was able to get her wound to heal. I'll be see what she feels she when you staying compression and he reiterated this as well. 01/13/18 on evaluation today patient appears to actually be doing very well in regard to her bilateral medial malleolus ulcers. She seems to have tolerated the chemical cauterization with silver nitrate last week she did  have some pain through that evening but fortunately states that I'll be see since it seems to be doing better she is overall pleased with the progress. 01/21/18; really quite a remarkable improvement since I've last seen these wounds. We started using silver nitrate specially on the islands of hyper granulation which for some reason her around  the wound circumference. This is really done quite nicely. Primary dressing Hydrofera Blue under 4 layer compression. She seems to be able to hold out without a nurse rewrap. Follow-up in 1 week 01/28/18; we've continued the hydrofera blue but continued with chemical cauterization to the wound area that we started about a month ago for irregular hyper granulation. She is made almost stunning improvement in the overall wound dimensions. I was not really expecting this degree of improvement in these chronic wounds 02/05/18; we continue with Hydrofera Bluebut of also continued the aggressive chemical cauterization with silver nitrate. We made nice progress with the right greater than left wound. 02/12/18. We continued with Hydrofera Blue after aggressive chemical cauterization with silver nitrate. We appear to be making nice progress with both wound areas 02/19/2018; we continue with [facility] after washing the wounds vigorously with Anasept spray and chemical cauterization with silver nitrate. We are making excellent progress. The area on the right's just about closed 02/26/2018. The area on the left medial ankle had too much necrotic debris today. I used a #5 curette we are able to get most of the soft. I continued with the silver nitrate to the much smaller wound on the right medial ankle she had a new area on her right lower pretibial area which she says was due to a role in her compression 03/05/2018; both wound areas look healthy. Not much change in dimensions from last week. I continue to use silver nitrate and Hydrofera Blue. The patient saw Dr. Doren Custard of vein and vascular. He felt she had venous stasis ulcers. He felt based on her previous arteriogram she should have adequate circulation for healing. Also she has deep venous reflux but really no significant correctable superficial venous reflux at this time. He felt we should continue with conservative management including leg elevation and  compression 04/02/2018; since we last saw this woman about a month ago she had a fall apparently suffered a pelvic fracture. I did not look up the x-ray. Nevertheless because of pain she literally was bedbound for 2 weeks and had home health coming out to change the dressing. Somewhat predictably this is resulted in considerable improvement in both wound areas. The right is just about closed on the medial malleolus and the left is about half the size. 04/16/2018; both her wounds continue to go down in size. Using Hydrofera Blue. 05/07/18; both her wounds appeared to be improving especially on the right where it is almost closed. We are using Hydrofera Blue 05/14/2018; slightly worse this week with larger wounds. Surface on the left medial not quite as good. We have been using Hydrofera Blue 05/21/18; again the wounds are slightly larger. Left medial malleolus slightly larger with eschar around the circumference. We have been using Hydrofera Blue undergoing a wraps for a prolonged period of time. This got a lot better when she was more recumbent due to a fall and a back injury. I change the primary dressing the silver alginate today. She did not tolerate a 4 layer compression previously although I may need to bring this up with her next time 05/28/2018; area on the left medial malleolus again is  slightly larger with more drainage. Area on the right is roughly unchanged. She has a small area of folliculitis on the right medial just on the lower calf. This does not look ominous. 06/03/2018 left medial malleolus slightly smaller in a better looking surface. We used silver nitrate on this last time with silver alginate. The area on the right appears slightly smaller 1/10; left medial malleolus slightly smaller. Small open area on the right. We used silver nitrate and silver alginate as of 2 weeks ago. We continue with the wound and compression. These got a lot better when she was off her feet 1/17; right  medial malleolus wound is smaller. The left may be slightly smaller. Both surfaces look somewhat better. 1/24; both wounds are slightly smaller. Using silver alginate under Unna boots 1/31; both wounds appear smaller in fact the area on the right medial is just about closed. Surface eschar. We have been using silver alginate under Unna boots. The patient is less active now spends let much less time on her feet and I think this is contributed to the general improvement in the wound condition 2/7; both wounds appear smaller. I was hopeful the right medial would be closed however there there is still the same small open area. Slight amount of surface eschar on the left the dimensions are smaller there is eschar but the wound edges appear to be free. We have been using silver alginate under Unna boot's 2/14; both wounds once again measure smaller. Circumferential eschar on the left medial. We have been using silver alginate under Unna boots with gradual improvement 2/21; the area on the right medial malleolus has healed. The area on the left is smaller. We have been using silver alginate and Unna boots. We can discharge wrapping the right leg she has 20/30 stockings at home she will need to protect the scar tissue in this area 2/28; the area on the right medial malleolus remains closed the patient has a compression stocking. The area on the left is smaller. We have been using silver alginate and Unna boots. 3/6 the area on the right medial ankle remains closed. Good edema control noted she is using her own compression stocking. The area on the left medial ankle is smaller. We have been managing this with silver alginate and Unna boots which we will continue today. 3/13; the area on the right medial ankle remains closed and I'm declaring it healed today. When necessary the left is about the same still a healthy-looking surface but no major change and wound area. No evidence of infection and using silver  alginate under unna and generally making considerable improvement 3/27 the area on the right medial ankle remains closed the area on the left is about the same as last week. Certainly not any worse we have been using silver alginate under an Unna boot 4/3; the area on the right medial ankle remains closed per the patient. We did not look at this wound. The wound on the left medial ankle is about the same surface looks healthy we have been using silver alginate under an Unna boot 4/10; area on the right medial ankle remains closed per the patient. We did not look at this wound. The wound on the left medial ankle is slightly larger. The patient complains that the [facility] caused burning pain all week. She also told us that she was a lot more active this week. Changed her back to silver alginate 4/17; right medial ankle still closed per the  patient. Left medial ankle is slightly larger. Using silver alginate. She did not tolerate Hydrofera Blue on this area 4/24; right medial ankle remains closed we have not look at this. The left medial ankle continues to get larger today by about a centimeter. We have been using silver alginate under Unna boots. She complains about 4 layer compression as an alternative. She has been up on her feet working on her garden 5/8; right medial ankle remains closed we did not look at this. The left medial ankle has increased in size about 100%. We have been using silver alginate under Unna boots. She noted increased pain this week and was not surprised that the wound is deteriorated 5/15; no major change in SA however much less erythema ( one week of doxy ocellulitis). 5/22-64 year old female returns at 1 week to the clinic for left medial ankle wound for which we have been using silver alginate under 3 layer compression She was placed on DOXY at last visit - the wound is wider at this visit. She is in 3 layer compression 5/29; change to [facility] last week. I had  given her empiric doxycycline 2 weeks ago for a week. She is in 3 layer compression. She complains of a lot of pain and drainage on presentation today. 6/5; using Hydrofera Blue. I gave her doxycycline recently empirically for erythema and pain around the wound. Believe her cultures showed enterococcus which not would not have been well covered by doxycycline nevertheless the wound looks better and I don't feel specifically that the enterococcus needs to be covered. She has a new what looks like a wrap injury on her lateral left ankle. 6/12; she is using Hydrofera Blue. She has a new area on the left anterior lower tibial area. This was a wrap injury last week. 6/19; the patient is using Hydrofera Blue. She arrived with marked inflammation and erythema around the wound and tenderness. 12/01/18 on evaluation today patient appears to be doing a little bit better based on what I'm hearing from the standpoint of lassos evaluation to this as far as the overall appearance of the wound is concerned. Then sometime substandard she typically sees Dr. Dellia Nims. Nonetheless overall very pleased with the progress that she's made up to this point. No fevers, chills, nausea, or vomiting noted at this time. 7/10; some improvement in the surface area. Aggressively debrided last week apparently. I went ahead with the debridement today although the patient does not tolerate this very well. We have been using Iodoflex. Still a fair amount of drainage 7/17; slightly smaller. Using Iodoflex. 7/24; no change from last week in terms of surface area. We have been using Iodoflex. Surface looks and continues to look somewhat better 7/31; surface area slightly smaller better looking surface. We have been using Iodoflex. This is under Unna boot compression 8/7-Patient presents at 1 week with Unna boot and Iodoflex, wound appears better 8/14-Patient presents at 1 week with Iodoflex, we use the Unna boot, wound appears to be stable  better.Patient is getting Botox treatment for the inversion of the foot for tendon release, Next week 8/21; we are using Iodoflex. Unna boot. The wound is stable in terms of surface area. Under illumination there is some areas of the wound that appear to be either epithelialized or perhaps this is adherent slough at this point I was not really clear. It did not wipe off and I was reluctant to debride this today. 8/28; we are using Iodoflex in an Unna boot. Seems  to be making good improvement. 9/4; using Iodoflex and wound is slightly smaller. 9/18; we are using Iodoflex with topical silver nitrate when she is here. The wound continues to be smaller 10/2; patient missed her appointment last week due to GI issues. She left and Iodoflex based dressing on for 2 weeks. Wound is about the same size about the size of a dime on the left medial lower 10/9 we have been using Iodoflex on the medial left ankle wound. She has a new superficial probable wrap injury on the dorsal left ankle 10/16; we have been using Hydrofera Blue since last week. This is on the left medial ankle 10/23; we have been using Hydrofera Blue since 2 weeks ago. This is on the left medial ankle. Dimensions are better 11/6; using Hydrofera Blue. I think the wound is smaller but still not closed. Left medial ankle 11/13; we have been using Hydrofera Blue. Wound is certainly no smaller this week. Also the surface not as good. This is the remanent of a very large area on her left medial ankle. 11/20; using Sorbact since last week. Wound was about the same in terms of size although I was disappointed about the surface debris 12/11; 3-week follow-up. Patient was on vacation. Wound is measuring slightly larger we have been using Sorbact. 12/18; wound is about the same size however surface looks better last week after debridement. We have been using Sorbact under compression 1/15 wound is probably twice the size of last time increased in length  nonviable surface. We have been using Sorbact. She was running a mild fever and missed her appointment last week 1/22; the wound is come down in size but under illumination still a very adherent debris we have been Hydrofera Blue that I changed her to last week 1/29; dimensions down slightly. We have been using Hydrofera Blue 2/19 dimensions are the same however there is rims of epithelialization under illumination. Therefore more the surface area may be epithelialized 2/26; the patient's wound actually measures smaller. The wound looks healthy. We have been using Hydrofera Blue. I had some thoughts about running Apligraf then I still may do that however this looks so much better this week we will delay that for now 3/5; the wound is small but about the same as last week. We have been using Hydrofera Blue. No debridement is required today. 3/19; the wound is about the size of a dime. Healthy looking wound even under illumination. We have been using Hydrofera Blue. No mechanical debridement is necessary 3/26; not much change from last week although still looks very healthy. We have been using Hydrofera Blue under Unna boots Patient was offered an ankle fusion by podiatry but not until the wound heals with a proceed with this. 4/9; the patient comes in today with her original wound on the medial ankle looking satisfactory however she has some uncontrolled swelling in the middle part of her leg with 2 new open areas superiorly just lateral to the tibia. I think this was probably a wrap issue. She said she felt uncomfortable during the week but did not call in. We have been using Hydrofera Blue 4/16; the wound on the medial ankle is about the same. She has innumerable small areas superior to this across her mid tibia. I think this is probably folliculitis. She is also been working in the yard doing a lot of sweating 4/30; the patient issue on the upper areas across her mid tibia of all healed. I think  this  was excessive yard work if I remember. Her wound on the medial ankle is smaller. Some debris on this we have been using Hydrofera Blue under Unna boots 5/7; mid tibia. She has been using Hydrofera Blue under an Unna wrap. She is apparently going for her ankle surgery on June 3 10/28/19-Patient returns to clinic with the ankle wound, we are using Hydrofera Blue under Unna wrap, surgery is scheduled for her left foot for June 3 so she will be back for nurse visit next week READMISSION 01/17/2020 Mrs. Wojahn is a 64 year old woman we have had in this clinic for a long period of time with severe venous hypertension and refractory wounds on her medial lower legs and ankles bilaterally. 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 When she was off her legs for a prolonged period example when she fell and suffered a compression fracture things would heal up quite nicely. She is now retired and we managed to heal up the right medial leg wound. The left one was very tiny last time I saw this although still refractory. 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 It got to the point where this was interfering with her walking and she elected to proceed with a ankle arthrodesis to straighten her her ankle and leave her with a functional outcome for mobilization. The patient was referred to Dr. Doren Custard and really this took some time to arrange. Dr. Doren Custard saw her on 12/07/2019. Once again he verified that she had no arterial issues. She had previously had an angiogram several years ago. Follow-up ABIs on the left showed an ABI of 1.12 with triphasic waveforms and a TBI of 0.92. She is felt to have chronic deep venous insufficiency but I do not think it was felt that anything could be done from about this from  an ablation point of view. At the time Dr. Doren Custard saw this patient the wounds actually look closed via the pictures in his clinic. The patient finally underwent her surgery on 12/15/2019. This went reasonably well and there was a good anatomic outcome. She developed a small distal wound dehiscence on the lateral part of the surgical wound. However more problematically she is developed recurrence of the wound on the medial left ankle. There are actually 2 wounds here one in the distal lower leg and 1 pretty much at the level of the medial malleolus. It is a more distal area that is more problematic. She has been using Hydrofera Blue which started on Friday before this she was simply Ace wrapping. There was a culture done that showed Pseudomonas and she is on ciprofloxacin. A recent CNS on 8/11 was negative. The patient reports some pain but I generally think this is improving. She is using a cam boot completely nonweightbearing using a walker for pivot transfers and a wheelchair 8/24; not much improvement unfortunately she has a surgical wound on the lateral part in the venous insufficiency wound medially. The bottom part of the medial insufficiency wound is still necrotic there is exposed tendon here. We have been using Hydrofera Blue under compression. Her edema control is however better 8/31; patient in for follow-up of his surgical wound on the lateral part of her left leg and chronic venous insufficiency ulcers medially. We put her back in compression  last week. She comes in today with a complaint of 3 or 4 days worth of increasing pain. She felt her cam walker was rubbing on the area on the back of her heel. However there is intense erythema seems more likely she has cellulitis. She had 2 cultures done when she was seeing podiatry in the postop. One of them in late July showed Pseudomonas and she received a course of ciprofloxacin the other was negative on 8/11 she is allergic to penicillin  with anaphylactoid complaints of hives oral swelling via information in epic 9/9; when I saw this patient last week she had intense anterior erythema around her wound on the right lateral heel and ankle and also into the right medial heel. Some of this was no doubt drainage and her walker boot however I was convinced she had cellulitis. I gave her Levaquin and Bactrim she is finishing up on this now. She is following up with Dr. [name] he saw her yesterday. He is taken her out of the walking boot of course she is still nonweightbearing. Her x-ray was negative for any worrisome features such as soft tissue air etc. Things are a lot better this week. She has home health. We have been using Hydrofera Blue under an [employer] which she put back on yesterday. I did not wrap her last week 9/17; her surrounding skin looks a lot better. In fact the area on the left lateral ankle has just a scant amount of eschar. The only remaining wound is the large area on the left medial ankle. Probably about 60% of this is healthy granulation at the surface however she has a significant divot distally. This has adherent debris in it. I been using debridement and silver collagen to try and get this area to fill-in although I do not think we have made much progress this week 9/24; the patient's wound on the left medial ankle looks a lot better. The deeper divot area distally still requires debridement but this is cleaning up quite nicely we have been using silver collagen. The patient is complaining of swelling in her foot and is worried that that is contributing to the nonhealing of the ankle wound. She is also complaining of numbness in her anterior toes 10/4; left medial ankle. The small area distally still has a divot with necrotic material that I have been debriding away. This has an undermining area. She is approved for Apligraf. She saw Dr. [name] her surgeon on 10/1. I think he declared himself is satisfied with the  condition of things. Still nonweightbearing till the end of the month. 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 Her surgical wound is well closed. There is no evidence of infection Electronic Signature(s) Signed: 03/05/2020 4:54:01 PM By: Linton Ham MD Entered By: Linton Ham on 03/05/2020 10:22:23 -------------------------------------------------------------------------------- Physical Exam Details Patient Name: Date of Service: [name], [name] 03/05/2020 9:00 A M Medical Record Number: [id number] Patient Account Number: [id number] Date of Birth/Sex: Treating RN: [birth year] (64 y.o. [name] Provider: [name] Other Clinician: Referring Provider: Treating Provider/Extender: [name] in Treatment: 6 Constitutional Sitting or standing Blood Pressure is within target range for patient.. Pulse regular and within target range for patient.Marland Kitchen Respirations regular, non-labored and within target range.. Temperature is normal and within the target range for the patient.Marland Kitchen Appears in no distress. Notes Wound exam; left medial ankle. I used a #3 curette to remove necrotic  debris from the bottom of this in the divot. There is undermining but no exposed bone. The rest of the tissue in this large area looks satisfactory. No debridement is required Electronic Signature(s) Signed: 03/05/2020 4:54:01 PM By: Linton Ham MD Entered By: Linton Ham on 03/05/2020 10:23:14 -------------------------------------------------------------------------------- Physician Orders Details Patient Name: Date of Service: [name], [name] 03/05/2020 9:00 A M Medical Record Number: [id number] Patient Account Number: [id number] Date of Birth/Sex: Treating RN: 07/04/1955 (64 y.o. [name] Provider: [name] Other Clinician: Referring Provider: Treating Provider/Extender: [name] in Treatment: 6 Verbal / Phone Orders: No Diagnosis Coding ICD-10 Coding Code Description [phone] Chronic venous hypertension (idiopathic) with ulcer and inflammation of left lower extremity L97.828 Non-pressure chronic ulcer of other part of left lower leg with other specified severity L03.116 Cellulitis of left lower limb L97.328 Non-pressure chronic ulcer of left ankle with other specified severity T81.31XD Disruption of external operation (surgical) wound, not elsewhere classified, subsequent encounter Follow-up Appointments Return Appointment in 1 week. Dressing Change Frequency Other: - home health to change dressing twice a week. Skin Barriers/Peri-Wound Care Moisturizing lotion - to leg Wound Cleansing May shower with protection. - use cast protector Primary Wound Dressing Wound #15 Left,Medial Malleolus Silver Collagen - moisten with hydrogel., be sure to tuck into tunnel at 6 o'clock Secondary Dressing Wound #15 Left,Medial Malleolus Dry Gauze ABD pad Edema Control Unna Boot to Left Lower Extremity Avoid standing for long periods of time Elevate legs to the level of the heart or above for 30 minutes daily and/or when sitting, a frequency of: - throughout the day Off-Loading Other: - ensure no pressure to wound areas. [facility] skilled nursing for wound care. - [facility] home health to change twice a week. Electronic Signature(s) Signed: 03/05/2020 4:54:01 PM By: Linton Ham MD Signed: 03/05/2020 5:03:51 PM By: Baruch Gouty RN, BSN Entered By: Baruch Gouty on 03/05/2020 09:47:57 -------------------------------------------------------------------------------- Problem List Details Patient Name: Date of Service: [name], [name] 03/05/2020 9:00 A M Medical Record Number: [id number] Patient Account Number: [id number] Date of Birth/Sex: Treating RN: 11-Jul-1955 (64 y.o. [name]  Provider: [name] Other Clinician: Referring Provider: Treating Provider/Extender: [name] in Treatment: 6 Active Problems ICD-10 Encounter Code Description Active Date MDM Diagnosis I87.332 Chronic venous hypertension (idiopathic) with ulcer and inflammation of left 01/17/2020 No Yes lower extremity L97.828 Non-pressure chronic ulcer of other part of left lower leg with other specified 01/17/2020 No Yes severity L97.328 Non-pressure chronic ulcer of left ankle with other specified severity 01/17/2020 No Yes Inactive Problems ICD-10 Code Description Active Date Inactive Date L03.116 Cellulitis of left lower limb 01/31/2020 01/31/2020 T81.31XD Disruption of external operation (surgical) wound, not elsewhere classified, subsequent 01/17/2020 01/17/2020 encounter Resolved Problems Electronic Signature(s) Signed: 03/05/2020 4:54:01 PM By: Linton Ham MD Entered By: Linton Ham on 03/05/2020 10:19:57 -------------------------------------------------------------------------------- Progress Note Details Patient Name: Date of Service: [name], [name] 03/05/2020 9:00 A M Medical Record Number: [id number] Patient Account Number: [id number] Date of Birth/Sex: Treating RN: January 18, 1956 (64 y.o. [name] Provider: [name] Other Clinician: Referring Provider: Treating Provider/Extender: [name] in Treatment: 6 Subjective History of Present Illness (HPI) the remaining wound is over the left medial ankle. Similar wound over the right medial ankle healed largely with use of Apligraf. Most recently we have been using Hydrofera Blue over this wound with  considerable improvement. The patient has been extensively worked up in the past for her venous insufficiency and she is not a candidate for antireflux surgery although I have none of the details available currently. 08/24/14; considerable improvement  today. About 50% of this wound areas now epithelialized. The base of the wound appears to be healthier granulation.as opposed to last week when she had deteriorated a considerable improvement 08/17/14; unfortunately the wound has regressed somewhat. The areas of epithelialization from the superior aspect are not nearly as healthy as they were last week. The patient thinks her Hydrofera Blue slipped. 09/07/14; unfortunately the area has markedly regressed in the 2 weeks since I've seen this. There is an odor surrounding erythema. The healthy granulation tissue that we had at the base of the wound now is a dusky color. The nurse reports green drainage 09/14/14; the area looks somewhat better than last week. There is less erythema and less drainage. The culture I did did not show any growth. Nevertheless I think it is better to continue the Cipro and doxycycline for a further week. The remaining wound area was debridement. 09/21/14. Wound did not require debridement last week. Still less erythema and less drainage. She can complete her antibiotics. The areas of epithelialization in the superior aspect of the wound do not look as healthy as they did some weeks ago 10/05/14 continued improvement in the condition of this wound. There is advancing epithelialization. Less aggressive debridement required 10/19/14 continued improvement in the condition and volume of this wound. Less aggressive debridement to the inferior part of this to remove surface slough and fibrinous eschar 11/02/14 no debridement is required. The surface granulation appears healthy although some of her islands of epithelialization seem to have regressed. No evidence of infection 11/16/14; lites surface debridement done of surface eschar. The wound does not look to be unhealthy. No evidence of infection. Unfortunately the patient has had podiatry issues in the right foot and for some reason has redeveloped small surface ulcerations in the medial  right ankle. Her original presentation involved wounds in this area 11/23/14 no debridement. The area on the right ankle has enlarged. The left ankle wound appears stable in terms of the surface although there is periwound inflammation. There has been regression in the amount of new skin 11/30/14 no debridement. Both wound areas appear healthy. There was no evidence of infection. The the new area on the right medial ankle has enlarged although that both the surfaces appear to be stable. 12/07/14; Debridement of the right medial ankle wound. No no debridement was done on the left. 12/14/14 no major change in and now bilateral medial ankle wounds. Both of these are very painful but the no overt evidence of infection. She has had previous venous ablation 12/21/14; patient states that her right medial ankle wound is considerably more painful last week than usual. Her left is also somewhat painful. She could not tolerate debridement. The right medial ankle wound has fibrinous surface eschar 12/28/14 this is a patient with severe bilateral venous insufficiency ulcers. For a considerable period of time we actually had the one on the right medial ankle healed however this recently opened up again in June. The left medial ankle wound has been a refractory area with some absent flows. We had some success with Hydrofera Blue on this area and it literally closed by 50% however it is recently opened up Foley. Both of these were debridement today of surface eschar. She tolerates this poorly 01/25/15: No change in the  status of this. Thick adherent escar. Very poor tolerance of any attempt at debridement. I had healed the right medial malleolus wound for a considerable amount of time and had the left one down to about 50% of the volume although this is totally regressed over the last 48 weeks. Further the right leg has reopened. she is trying to make a appointment with pain and vascular, previous ablations with Dr.  Aleda Grana. I do not believe there is an arterial insufficiency issue here 02/01/15 the status of the adherent eschar bilaterally is actually improved. No debridement was done. She did not manage to get vascular studies done 02/08/15 continued debridement of the area was done today. The slough is less adherent and comes off with less pressure. There is no surrounding infection peripheral pulses are intact 02/15/15 selective debridement with a disposable curette. Again the slough is less adherent and comes off with less difficulty. No surrounding infection peripheral pulses are intact. 02/22/15 selective debridement of the right medial ankle wound. Slough comes off with less difficulty. No obvious surrounding infection peripheral pulses are intact I did not debridement the one on the left. Both of these are stable to improved 03/01/15 selective debridement of both wound areas using a curette to. Adherent slough cup soft with less difficulty. No obvious surrounding infection. The patient tells me that 2 days ago she noted a rash above the right leg wrap. She did not have this on her lower legs when she change this over she arrives with widespread left greater than right almost folliculitis-looking rash which is extremely pruritic. I don't see anything to culture here. There is no rash on the rest of her body. She feels well systemically. 03/08/15; selective debridement of both wounds using a curette. Base of this does not look unhealthy. She had limegreen drainage coming out of the left leg wound and describes a lot of drainage. The rash on her left leg looks improved to. No cultures were done. 03/22/15; patient was not here last week. Basal wounds does not look healthy and there is no surrounding erythema. No drainage. There is still a rash on the left leg that almost looks vasculitic however it is clearly limited to the top of where the wrap would be. 04/05/15; on the right required a surgical debridement  of surface eschar and necrotic subcutaneous tissue. I did not debridement the area on the left. These continue to be large open wounds that are not changing that much. We were successful at one point in healing the area on the right, and at the same time the area on the left was roughly half the size of current measurements. I think a lot of the deterioration has to do with the prolonged time the patient is on her feet at work 04/19/15 I attempted-like surface debridement bilaterally she does not tolerate this. She tells me that she was in allergic care yesterday with extreme pain over her left lateral malleolus/ankle and was told that she has an "sprain" 05/03/15; large bilateral venous insufficiency wounds over the medial malleolus/medial aspect of her ankles. She complains of copious amounts of drainage and his usual large amounts of pain. There is some increasing erythema around the wound on the right extending into the medial aspect of her foot to. historically she came in with these wounds the right one healed and the left one came down to roughly half its current size however the right one is reopened and the left is expanded. 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 05/10/15 large bilateral venous insufficiency wounds. There is less adherence surface left however the surface culture that I did last week grew pseudomonas therefore bilateral selective debridement score necessary. There is surrounding erythema. The patient describes severe bilateral drainage and a lot of pain in the left ankle. Apparently her podiatrist was were ready to do a cortisone shot 05/17/15; the patient complains of pain and again copious amounts of drainage. 05/24/15; we used Iodo flex last week. Patient notes considerable improvement in wound drainage. Only needed to change this once. 05/31/15; we continued Iodoflex; the base of these large wounds bilaterally is not  too bad but there is probably likely a significant bioburden here. I would like to debridement just doesn't tolerate it. 06/06/14 I would like to continue the Iodoflex although she still hasn't managed to obtain supplies. She has bilateral medial malleoli or large wounds which are mostly superficial. Both of them are covered circumferentially with some nonviable fibrinous slough although she tolerates debridement very poorly. She apparently has an appointment for an ablation on the right leg by interventional radiology. 06/14/15; the patient arrives with the wounds and static condition. We attempted a debridement although she does not do well with this secondary to pain. I 07/05/15; wounds are not much smaller however there appears to be a cleaner granulating base. The left has tight fibrinous slough greater than the right. Debridement is tolerated poorly due to pain. Iodoflex is done more for these wounds in any of the multitude of different dressings I have tried on the left 1 and then subsequently the right. 07/12/15; no change in the condition of this wound. I am able to do an aggressive debridement on the right but not the left. She simply cannot tolerate it. We have been using Iodoflex which helps somewhat. It is worthwhile remembering that at one point we healed the right medial ankle wound and the left was about 25% of the current circumference. We have suggested returning to vascular surgery for review of possible further ablations for one reason or another she has not been able to do this. 07/26/15 no major change in the condition of either wound on her medial ankle. I did not attempt to debridement of these. She has been aggressively scrubbing these while she is in the shower at home. She has her supply of Iodoflex which seems to have done more for these wounds then anything I have put on recently. 08/09/15 wound area appears larger although not verified by measurements. Using Iodoflex 09/05/2015  -- she was here for avisit today but had significant problems with the wound and I was asked to see her for a physician opinion. I have summarize that this lady has had surgery on her left lower extremity about 10 years ago where the possible veins stripping was done. She has had an opinion from interventional radiology around November 2016 where no further sclerotherapy was ordered. The patient works 12 hours a day and stands on a concrete floor with work boots and is unable to get the proper compression she requires and cannot elevate her limbs appropriately at any given time. She has recently grown Pseudomonas from her wound culture but has not started her ciprofloxacin which was called in for her. 09/13/15 this continues to be a difficult situation for this patient. At one point I had this wound down to a 1.5 x 1.5" wound on her left leg. This  is deteriorated and the right leg has reopened. She now has substantial wounds on her medial calcaneus, malleoli and into her lower leg. One on the left has surface eschar but these are far too painful for me to debridement here. She has a vascular surgery appointment next week to see if anything can be done to help here. I think she has had previous ablations several years ago at [facility]. She has no major edema. She tells me that she did not get product last time [facility]] and went for several days without it. She continues to work in work boots 12 hours a day. She cannot get compression/4-layer under her work boots. 09/20/15 no major change. Periwound edema control was not very good. Her point with pain and vascular is next Wednesday the 25th 09/28/15; the patient is seen vascular surgery and is apparently scheduled for repeat duplex ultrasounds of her bilateral lower legs next week. 10/05/15; the patient was seen by Dr. Doren Custard of vascular surgery. He feels that she should have arterial insufficiency excluded as cause/contributed to her nonhealing stage  she is therefore booked for an arteriogram. She has apparently monophasic signals in the dorsalis pedis pulses. She also of course has known severe chronic venous insufficiency with previous procedures as noted previously. I had another long discussion with the patient today about her continuing to work 12 hour shifts. I've written her out for 2 months area had concerns about this as her work location is currently undergoing significant turmoil and this may lead to her termination. She is aware of this however I agree with her that she simply cannot continue to stand for 12 hours multiple days a week with the substantial wound areas she has. 10/19/15; the Dr. Doren Custard appointment was largely for an arteriogram which was normal. She does not have an arterial issue. He didn't make a comment about her chronic venous insufficiency for which she has had previous ablations. Presumably it was not felt that anything additional could be done. The patient is now out of work as I prescribed 2 weeks ago. Her wounds look somewhat less aggravated presumably because of this. I felt I would give debridement another try today 10/25/15; no major change in this patient's wounds. We are struggling to get her product that she can afford into her own home through her insurance. 11/01/15; no major change in the patient's wounds. I have been using silver alginate as the most affordable product. I spoke to Dr. Marla Roe last week with her requested take her to the OR for surgical debridement and placement of ACEL. Dr. Marla Roe told me that she would be willing to do this however [facility] will not cover this, fortunately the patient has [place] healthcare of some variant 11/08/15; no major change in the patient's wounds. She has been completely nonviable surface that this but is in too much pain with any attempted debridement are clinic. I have arranged for her to see Dr. Marla Roe ham of plastic surgery and this  appointment is on Monday. I am hopeful that they will take her to the OR for debridement, possible ACEL ultimately possible skin graft 11/22/15 no major change in the patient's wounds over her bilateral medial calcaneus medial malleolus into the lower legs. Surface on these does not look too bad however on the left there is surrounding erythema and tenderness. This may be cellulitis or could him sleepy tinea. 11/29/15; no major changes in the patient's wounds over her bilateral medial malleolus. There  is no infection here and I don't think any additional antibiotics are necessary. There is now plan to move forward. She sees Dr. Marla Roe in a week's time for preparation for operative debridement and ACEL placement I believe on 7/12. She then has a follow-up appointment with Dr. Marla Roe on 7/21 12/28/15; the patient returns today having been taken to the [facility] by Dr. Marla Roe 12/12/15 she underwent debridement, intraoperative cultures [which were negative]. She had placement of a wound VAC. Parent really ACEL was not available to be placed. The wound VAC foam apparently adhered to the wound since then she's been using silver alginate, Xeroform under Ace wraps. She still says there is a lot of drainage and a lot of pain 01/31/16; this is a patient I see monthly. I had referred her to Dr. Marla Roe him of plastic surgery for large wounds on her bilateral medial ankles. She has been to the OR twice once in early July and once in early August. She tells me over the last 3 weeks she has been using the wound VAC with ACEL underneath it. On the right we've simply been using silver alginate. Under Kerlix Coban wraps. 02/28/16; this is a patient I'm currently seeing monthly. She is gone on to have a skin graft over her large venous insufficiency ulcer on the left medial ankle. This was done by Dr. Marla Roe him. The patient is a bit perturbed about why she didn't have one on her right medial ankle wound. She has  been using silver alginate to this. 03/06/16; I received a phone call from her plastic surgery Dr. Marla Roe. She expressed some concern about the viability of the skin graft she did on the left medial ankle wound. Asked me to place Endoform on this. She told me she is not planning to do a subsequent skin graft on the right as the left one did not take very well. I had placed Hydrofera Blue on the right 03/13/16; continue to have a reasonably healthy wound on the right medial ankle. Down to 3 mm in terms of size. There is epithelialization here. The area on the left medial ankle is her skin graft site. I suppose the last week this looks somewhat better. She has an open area inferiorly however in the center there appears to be some viable tissue. There is a lot of surface callus and eschar that will eventually need to come off however none of this looked to be infected. Patient states that the is able to keep the dressing on for several days which is an improvement. 03/20/16 no major change in the circumference of either wound however on the left side the patient was at Dr. Eusebio Friendly office and they did a debridement of left wound. 50% of the wound seems to be epithelialized. I been using Endoform on the left Hydrofera Blue in the right 03/27/16; she arrives today with her wound is not looking as healthy as they did last week. The area on the right clearly has an adherent surface to this a very similar surface on the left. Unfortunately for this patient this is all too familiar problem. Clearly the Endoform is not working and will need to change that today that has some potential to help this surface. She does not tolerate debridement in this clinic very well. She is changing the dressing wants 04/03/16; patient arrives with the wounds looking somewhat better especially on the right. Dr. Migdalia Dk change the dressing to silver alginate when she saw her on Monday and also  sold her some compression socks.  The usefulness of the latter is really not clear and woman with severely draining wounds. 04/10/16; the patient is doing a bit of an experiment wearing the compression stockings that Dr. Migdalia Dk provided her to her left leg and the out of legs based dressings that we provided to the right. 05/01/16; the patient is continuing to wear compression stockings Dr. Migdalia Dk provided her on the left that are apparently silver impregnated. She has been using Iodoflex to the right leg wound. Still a moderate amount of drainage, when she leaves here the wraps only last for 4 days. She has to change the stocking on the left leg every night 05/15/16; she is now using compression stockings bilaterally provided by Dr. Marla Roe. She is wearing a nonadherent layer over the wounds so really I don't think there is anything specific being done to this now. She has some reduction on the left wound. The right is stable. I think all healing here is being done without a specific dressing 06/09/16; patient arrives here today with not much change in the wound certainly in diameter to large circular wounds over the medial aspect of her ankle bilaterally. Under the light of these services are certainly not viable for healing. There is no evidence of surrounding infection. She is wearing compression stockings with some sort of silver impregnation as prescribed by Dr. Marla Roe. She has a follow-up with her tomorrow. 06/30/16; no major change in the size or condition of her wounds. These are still probably covered with a nonviable surface. She is using only her purchase stockings. She did see Dr. Marla Roe who seemed to want to apply Dakin's solution to this I'm not extreme short what value this would be. I would suggest Iodoflex which she still has at home. 07/28/16; I follow Mrs. Jasso episodically along with Dr. Marla Roe. 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  She has been applying a topical collagen ointment to both wounds with Adaptic. I don't think Dr. Marla Roe is planning to take her back to the OR. 08/19/16; I follow Mrs. Jeneen Rinks on a monthly basis along with Dr. Marla Roe of plastic surgery. 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 I been following her for a number of years. At one point I was able to get the right medial malleolus wound to heal and had the left medial malleolus down to about half its current size however and I had to send her to plastic surgery for an operative debridement. Since then things have been stable to slightly improve the area on the right is slightly better one in the left about the same although there is much less adherent surface than I'm used to with this patient. She is using some form of liquid collagen gel that Dr. Marla Roe provided a Kerlix cover with the patient's own pressure stockings. She tells me that she has extreme pain in both ankles and along the lateral aspect of both feet. She has been unable to work for some period of time. She is telling me she is retiring at the beginning of April. She sees Dr. Doran Durand of orthopedics next week 09/22/16; patient has not seen Dr. Marla Roe since the last time she is here. I'm not really sure what she is using to the wounds other than bits and pieces of think she had left over including most recently Hydrofera Blue. She is using juxtalite  stockings. She is having difficulty with her husband's recent illness "stroke". She is having to transport him to various doctors appointments. Dr. Marla Roe left her the option of a repeat debridement with ACEL however she has not been able to get the time to follow-up on this. She continues to have a fair amount of drainage out of these wounds with certainly precludes leaving dressings on all week 10/13/16; patient has not seen Dr. Marla Roe since she was last in  our clinic. I'm not really sure what she is doing with the wounds, we did try to get her [facility] and I think she is actually using this most of the time. Because of drainage she states she has to change this every second day although this is an improvement from what she used to do. She went to see Dr. Doran Durand who did not think she had a muscular issue with regards to her feet, he referred her to a neurologist and I think the appointment is sometime in June. I changed her back to Iodoflex which she has used in the past but not recently. 11/03/16; the patient has been using Iodoflex although she ran out of this. Still claims that there is a lot of drainage although the wound does not look like this. No surrounding erythema. She has not been back to see Dr. Marla Roe 11/24/16; the patient has been using Iodoflex again but she ran out of it 2 or 3 days ago. There is no major change in the condition of either one of these wounds in fact they are larger and covered in a thick adherent surface slough/nonviable tissue especially on the left. She does not tolerate mechanical debridement in our clinic. Going back to see Dr. Marla Roe of plastic surgery for an operative debridement would seem reasonable. 12/15/16; the patient has not been back to see Dr. Marla Roe. She is been dealing with a series of illnesses and her husband which of monopolized her time. She is been using Sorbact which we largely supplied. She states the drainage is bad enough that it maximum she can go 2-3 days without changing the dressing 01/12/2017 -- the patient has not been back for about 4 weeks and has not seen Dr. Marla Roe not does she have any appointment pending. 01/23/17; patient has not seen Dr. Marla Roe even though I suggested this previously. She is using Santyl that was suggested last week by Dr. Con Memos this Cost her $16 through her insurance which is indeed surprising 02/12/17; continuing Santyl and the patient is  changing this daily. A lot of drainage. She has not been back to see plastic surgery she is using an Ace wrap. Our intake nurse suggested wrap around stockings which would make a good reasonable alternative 02/26/17; patient is been using Santyl and changing this daily due to drainage. She has not been to see plastic surgery she uses in April Ace wrap to control the edema. She did obtain extremitease stockings but stated that the edema in her leg was to big for these 03/20/17; patient is using Santyl and Anasept. Surfaces looked better today the area on the right is actually measuring a little smaller. She has states she has a lot of pain in her feet and ankles and is asking for a consult to pain control which I'll try to help her with through our case manager. 04/10/17; the patient arrives with better-looking wound surfaces and is slightly smaller wound on the left she is using a combination of Santyl and Anasept. She has  an appointment or at least as started in the pain control center associated with [facility] 05/14/17; this is a patient who I followed for a prolonged period of time. 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 At one point I had this down to a much smaller wound on the left however these reopened and we've never been able to get these to heal. She has been using Santyl and Anasept gel although 2 weeks ago she ran out of the Anasept gel. She has a stable appearance of the wound. She is going to the wound care clinic at [facility]. They wanted do a nerve block/spinal block although she tells me she is reluctant to go forward with that. 05/21/17; this is a patient I have followed for many years. 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 Chronic pain and deformity in her ankles as well. She is been to see plastic surgery as well as orthopedics. Using PolyMem AG most recently/Kerramax/ABDs and 2 layer compression. She has  managed to keep this on and she is coming in for a nurse check to change the dressing on Tuesdays, we see her on Fridays 06/05/17; really quite a good looking surface and the area especially on the right medial has contracted in terms of dimensions. Well granulated healthy-looking tissue on both sides. Even with an open curet there is nothing that even feels abnormal here. This is as good as I've seen this in quite some time. We have been using PolyMem AG and bringing her in for a nurse check 06/12/17; really quite good surface on both of these wounds. The right medial has contracted a bit left is not. We've been using PolyMem and AG and she is coming in for a nurse visit 06/19/17; we have been using PolyMem AG and bringing her in for a nurse check. Dimensions of her wounds are not better but the surfaces looked better bilaterally. She complained of bleeding last night and the left wound and increasing pain bilaterally. She states her wound pain is more neuropathic than just the wounds. There was some suggestion that this was radicular from her pain management doctor in talking to her it is really difficult to sort this out. 06/26/17; using PolyMem and AG and bringing her in for a nurse check as All of this and reasonably stable condition. Certainly not improved. The dimensions on the lateral part of the right leg look better but not really measuring better. The medial aspect on the left is about the same. 07/03/16; we have been using PolyMen AG and bringing her in for a nurse check to change the dressings as the wounds have drainage which precludes once weekly changing. We are using all secondary absorptive dressings.our intake nurse is brought up the idea of using a wound VAC/snap VAC on the wound to help with the drainage to see if this would result in some contraction. This is not a bad idea. The area on the right medial is actually looking smaller. Both wounds have a reasonable-looking surface. There is  no evidence of cellulitis. The edema is well controlled 07/10/17; the patient was denied for a snap VAC by her insurance. The major issue with these wounds continues to be drainage. We are using wicked PolyMem AG and she is coming in for a nurse visit to change this. The wounds are stable to slightly improved. The surface looks vibrant and the area on the right certainly has shrunk in size but very slowly  07/17/17; the patient still has large wounds on her bilateral medial malleoli. Surface of both of these wounds looks better. The dimensions seem to come and go but no consistent improvement. There is no epithelialization. We do not have options for advanced treatment products due to insurance issues. They did not approve of the wound VAC to help control the drainage. More recently we've been using PolyMem and AG wicked to allow drainage through. We have been bringing her in for a nurse visit to change this. We do not have a lot of options for wound care products and the home again due to insurance issues 07/24/17; the patient's wound actually looks somewhat better today. No drainage measurements are smaller still healthy-looking surface. We used silver collagen under PolyMen started last week. We have been bringing her in for a dressing change 07/31/17; patient's wound surface continued to look better and I think there is visible change in the dimensions of the wound on the right. Rims of epithelialization. We have been using silver collagen under PolyMen and bringing her in for a dressing change. There appears to be less drainage although she is still in need of the dressing change 08/07/17. Patient's wound surface continues to look better on both sides and the area on the right is definitely smaller. We have been using silver collagen and PolyMen. She feels that the drainage has been it has been better. I asked her about her vascular status. She went to see Dr. Aleda Grana at [facility] and had some  form of ablation. I don't have much detail on this. I haven't my notes from 2016 that she was not a candidate for any further ablation but I don't have any more information on this. We had referred her to vein and vascular I don't think she ever went. He does not have a history of PAD although I don't have any information on this either. We don't even have ABIs in our record 08/14/17; we've been using silver collagen and PolyMen cover. And putting the patient and compression. She we are bringing her in as a nurse visit to change this because ofarge amount of drainage. We didn't the ABIs in clinic today since they had been done in many moons 1.2 bilaterally. She has been to see vein and vascular however this was at [facility] and she had ablation although I really don't have any information on this all seemed biking get a report. She is also been operatively debrided by plastic surgery and had a cell placed probably 8-12 months ago. This didn't have a major effect. We've been making some gains with current dressings 08/19/17-She is here in follow-up evaluation for bilateral medial malleoli ulcers. She continues to tolerate debridement very poorly. We will continue with recently changed topical treatment; if no significant improvement may consider switching to Iodosorb/Iodoflex. 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 She has been using silver collagen and PolyMem. I believe she has been used and tried on Iodoflex before. 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 These have been very refractory over time. We have been using silver collagen and PolyMen. She is coming in once a week for a doctor's and once a week for nurse visits. We are actually making some  progress 09/18/17; the patient's wounds are  smaller especially on the right medial. She arrives today to upset to consider even washing these off with Anasept which I think is been part of the reason this is been closing. We've been using collagen covered in PolyMen otherwise. It is noted that she has a small area of folliculitis on the right medial calf that. As we are wrapping her legs I'll give her a short course of doxycycline to make sure this doesn't amount to anything. She is a long list of complaints today including imbalance, shortness of breath on exertion, inversion of her left ankle. With regards to the latter complaints she is been to see orthopedics and they offered her a tendon release surgery I believe but wanted her wounds to be closed first. I have recommended she go see her primary physician with regards to everything else. 09/25/17; patient's wounds are about the same size. We have made some progress bilaterally although not in recent weeks. She will not allow me T wash these wounds with Anasept even if she is doing her cell. Wheeze we've been using collagen covered in PolyMen. Last week she had a small area of folliculitis this is now opened into a small wound. She completed 5 days of trimethoprim sulfamethoxazole 10/02/17; unfortunately the area on her left medial ankle is worse with a larger wound area towards the Achilles. The patient complains of a lot of pain. She will not allow debridement although visually I don't think there is anything to debridement in any case. We have been using silver collagen and PolyMen for several months now. Initially we are making some progress although I'm not really seeing that today. We will move back to [facility]. His admittedly this is a bit of a repeat however I'm hoping that his situation is different now. The patient tells me she had her leg on the left give out on her yesterday this is process some pain. 10/09/17; the patient is seen  twice a week largely because of drainage issues coming out of the chronic medial bimalleolar wounds that are chronic. Last week the dimensions of the one on the left looks a little larger I changed her to [facility]. She comes in today with a history of terrible pain in the bilateral wound areas. She will not allow debridement. She will not even allow a tissue culture. There is no surrounding erythema no no evidence of cellulitis. We have been putting her Kerlix Coban man. She will not allow more aggressive compression as there was a suggestion to put her in 3 layer wraps. 10/16/17; large wounds on her bilateral medial malleoli. These are chronic. Not much change from last week. The surface looks have healthy but absolutely no epithelialization. A lot of pain little less so of drainage. She will not allow debridement or even washing these off in the vigorous fashion with Anasept. 10/23/17; large wounds on her bilateral malleoli which are chronic. Some improvement in terms of size perhaps on the right since last time I saw these. She states that after we increased the 3 layer compression there was some bleeding, when she came in for a nurse visit she did not want 3 layer compression put back on about our nurse managed to convince her. She has known chronic venous visit issues and I'm hoping to get her to tolerate the 3 layer compression. using Hydrofera Blue 10/30/17; absolutely no change in the condition of either wound although we've had some improvement in dimensions on the right.. Attempted to put her  in 3 layer compression she didn't tolerated she is back in 2 layer compression. We've been using Hydrofera Blue We looked over her past records. She had venous reflux studies in November 2016. There was no evidence of deep venous reflux on the right. Superficial vein did not show the greater saphenous vein at think this is been previously ablated the small saphenous vein was within normal limits. The  left deep venous system showed no DVT the vessels were positive for deep venous reflux in the posterior tibial veins at the ankle. The greater saphenous vein was surgically absent small saphenous vein was within normal limits. She went to vein and vascular at [facility]. I believe she had an ablation on the left greater saphenous vein. I'll update her reflux studies perhaps ever reviewed by vein and vascular. We've made absolutely no progress in these wounds. Will also try to read and TheraSkins through her insurance 11/06/17; W the patient apparently has a 2 week follow-up with vein and vascular I like him to review the whole issue with regards to her previous vascular workup by Dr. Aleda Grana. We've really made no progress on these wounds in many months. She arrives today with less viable looking surface on the left medial ankle wound. This was apparently looking about the same on Tuesday when she was here for nurse visit. 11/13/17; deep tissue culture I did last time of the left lower leg showed multiple organisms without any predominating. In particular no Staphylococcus or group A strep were isolated. We sent her for venous reflux studies. She's had a previous left greater saphenous vein stripping and I think sclerotherapy of the right greater saphenous vein. She didn't really look at the lesser saphenous vein this both wounds are on the medial aspect. She has reflux in the common femoral vein and popliteal vein and an accessory vein on the right and the common femoral vein and popliteal vein on the left. I'm going to have her go to see vein and vascular just the look over things and see if anything else beside aggressive compression is indicated here. We have not been able to make any progress on these wounds in spite of the fact that the surface of the wounds is never look too bad. 11/20/17; no major change in the condition of the wounds. Patient reports a large amount of drainage. She has a  lot of complaints of pain although enlisting her today I wonder if some of this at least his neuropathic rather than secondary to her wounds. She has an appointment with vein and vascular on 12/30/17. The refractory nature of these wounds in my mind at least need vein and vascular to look over the wounds the recent reflux studies we did and her history to see if anything further can be done here. I also note her gait is deteriorated quite a bit. 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 She has a bilateral Trendelenburg gait. I wonder if this is neuropathic or perhaps multilevel radicular. 11/27/17; her wounds actually looks slightly better. Healthy-looking granulation tissue a scant amount of epithelialization. [place] healthcare will not pay for [employer]. They will play for tri layer Oasis and Dermagraft. This is not a diabetic ulcer. We'll try for the tri layer Oasis. She still complains of some drainage. She has a vein and vascular appointment on 12/30/17 12/04/17; the wounds visually look quite good. Healthy-looking granulation with some degree of epithelialization. We are still waiting for response to our request  for trial to try layer Oasis. Her appointment with vascular to review venous and arterial issues isn't sold the end of July 7/31. Not allow debridement or even vigorous cleansing of the wound surface. 12/18/17; slightly smaller especially on the right. 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 We've been using Hydrofera Blue. We still are looking into triple layer Oasis through her insurance 01/08/18 on evaluation today patient's wound actually appears to be showing signs of good improvement at this point in time. She has been tolerating the dressing changes without complication. Fortunately there does not appear to be any evidence of infection at this point in time. We have been utilizing silver nitrate which does seem to be of benefit for her which is  also good news. Overall I'm very happy with how things seem to be both regards appearance as well as measurement. Patient did see Dr. Bridgett Larsson for evaluation on 12/30/17. In his assessment he felt that stripping would not likely add much more than chronic compression to the patient's healing process. His recommendation was to follow-up in three months with Dr. Doren Custard if she hasn't healed in order to consider referral back to you and see vascular where she previously was in a trial and was able to get her wound to heal. I'll be see what she feels she when you staying compression and he reiterated this as well. 01/13/18 on evaluation today patient appears to actually be doing very well in regard to her bilateral medial malleolus ulcers. She seems to have tolerated the chemical cauterization with silver nitrate last week she did have some pain through that evening but fortunately states that I'll be see since it seems to be doing better she is overall pleased with the progress. 01/21/18; really quite a remarkable improvement since I've last seen these wounds. We started using silver nitrate specially on the islands of hyper granulation which for some reason her around the wound circumference. This is really done quite nicely. Primary dressing Hydrofera Blue under 4 layer compression. She seems to be able to hold out without a nurse rewrap. Follow-up in 1 week 01/28/18; we've continued the hydrofera blue but continued with chemical cauterization to the wound area that we started about a month ago for irregular hyper granulation. She is made almost stunning improvement in the overall wound dimensions. I was not really expecting this degree of improvement in these chronic wounds 02/05/18; we continue with Hydrofera Bluebut of also continued the aggressive chemical cauterization with silver nitrate. We made nice progress with the right greater than left wound. 02/12/18. We continued with Hydrofera Blue after  aggressive chemical cauterization with silver nitrate. We appear to be making nice progress with both wound areas 02/19/2018; we continue with [facility] after washing the wounds vigorously with Anasept spray and chemical cauterization with silver nitrate. We are making excellent progress. The area on the right's just about closed 02/26/2018. The area on the left medial ankle had too much necrotic debris today. I used a #5 curette we are able to get most of the soft. I continued with the silver nitrate to the much smaller wound on the right medial ankle she had a new area on her right lower pretibial area which she says was due to a role in her compression 03/05/2018; both wound areas look healthy. Not much change in dimensions from last week. I continue to use silver nitrate and Hydrofera Blue. The patient saw Dr. Doren Custard of vein and vascular. He felt she had venous  stasis ulcers. He felt based on her previous arteriogram she should have adequate circulation for healing. Also she has deep venous reflux but really no significant correctable superficial venous reflux at this time. He felt we should continue with conservative management including leg elevation and compression 04/02/2018; since we last saw this woman about a month ago she had a fall apparently suffered a pelvic fracture. I did not look up the x-ray. Nevertheless because of pain she literally was bedbound for 2 weeks and had home health coming out to change the dressing. Somewhat predictably this is resulted in considerable improvement in both wound areas. The right is just about closed on the medial malleolus and the left is about half the size. 04/16/2018; both her wounds continue to go down in size. Using Hydrofera Blue. 05/07/18; both her wounds appeared to be improving especially on the right where it is almost closed. We are using Hydrofera Blue 05/14/2018; slightly worse this week with larger wounds. Surface on the left medial not  quite as good. We have been using Hydrofera Blue 05/21/18; again the wounds are slightly larger. Left medial malleolus slightly larger with eschar around the circumference. We have been using Hydrofera Blue undergoing a wraps for a prolonged period of time. This got a lot better when she was more recumbent due to a fall and a back injury. I change the primary dressing the silver alginate today. She did not tolerate a 4 layer compression previously although I may need to bring this up with her next time 05/28/2018; area on the left medial malleolus again is slightly larger with more drainage. Area on the right is roughly unchanged. She has a small area of folliculitis on the right medial just on the lower calf. This does not look ominous. 06/03/2018 left medial malleolus slightly smaller in a better looking surface. We used silver nitrate on this last time with silver alginate. The area on the right appears slightly smaller 1/10; left medial malleolus slightly smaller. Small open area on the right. We used silver nitrate and silver alginate as of 2 weeks ago. We continue with the wound and compression. These got a lot better when she was off her feet 1/17; right medial malleolus wound is smaller. The left may be slightly smaller. Both surfaces look somewhat better. 1/24; both wounds are slightly smaller. Using silver alginate under Unna boots 1/31; both wounds appear smaller in fact the area on the right medial is just about closed. Surface eschar. We have been using silver alginate under Unna boots. The patient is less active now spends let much less time on her feet and I think this is contributed to the general improvement in the wound condition 2/7; both wounds appear smaller. I was hopeful the right medial would be closed however there there is still the same small open area. Slight amount of surface eschar on the left the dimensions are smaller there is eschar but the wound edges appear to be free.  We have been using silver alginate under Unna boot's 2/14; both wounds once again measure smaller. Circumferential eschar on the left medial. We have been using silver alginate under Unna boots with gradual improvement 2/21; the area on the right medial malleolus has healed. The area on the left is smaller. We have been using silver alginate and Unna boots. We can discharge wrapping the right leg she has 20/30 stockings at home she will need to protect the scar tissue in this area 2/28; the area on the  right medial malleolus remains closed the patient has a compression stocking. The area on the left is smaller. We have been using silver alginate and Unna boots. 3/6 the area on the right medial ankle remains closed. Good edema control noted she is using her own compression stocking. The area on the left medial ankle is smaller. We have been managing this with silver alginate and Unna boots which we will continue today. 3/13; the area on the right medial ankle remains closed and I'm declaring it healed today. When necessary the left is about the same still a healthy-looking surface but no major change and wound area. No evidence of infection and using silver alginate under unna and generally making considerable improvement 3/27 the area on the right medial ankle remains closed the area on the left is about the same as last week. Certainly not any worse we have been using silver alginate under an Unna boot 4/3; the area on the right medial ankle remains closed per the patient. We did not look at this wound. The wound on the left medial ankle is about the same surface looks healthy we have been using silver alginate under an Unna boot 4/10; area on the right medial ankle remains closed per the patient. We did not look at this wound. The wound on the left medial ankle is slightly larger. The patient complains that the [facility] caused burning pain all week. She also told us that she was a lot more  active this week. Changed her back to silver alginate 4/17; right medial ankle still closed per the patient. Left medial ankle is slightly larger. Using silver alginate. She did not tolerate Hydrofera Blue on this area 4/24; right medial ankle remains closed we have not look at this. The left medial ankle continues to get larger today by about a centimeter. We have been using silver alginate under Unna boots. She complains about 4 layer compression as an alternative. She has been up on her feet working on her garden 5/8; right medial ankle remains closed we did not look at this. The left medial ankle has increased in size about 100%. We have been using silver alginate under Unna boots. She noted increased pain this week and was not surprised that the wound is deteriorated 5/15; no major change in SA however much less erythema ( one week of doxy ocellulitis). 5/22-64 year old female returns at 1 week to the clinic for left medial ankle wound for which we have been using silver alginate under 3 layer compression She was placed on DOXY at last visit - the wound is wider at this visit. She is in 3 layer compression 5/29; change to [facility] last week. I had given her empiric doxycycline 2 weeks ago for a week. She is in 3 layer compression. She complains of a lot of pain and drainage on presentation today. 6/5; using Hydrofera Blue. I gave her doxycycline recently empirically for erythema and pain around the wound. Believe her cultures showed enterococcus which not would not have been well covered by doxycycline nevertheless the wound looks better and I don't feel specifically that the enterococcus needs to be covered. She has a new what looks like a wrap injury on her lateral left ankle. 6/12; she is using Hydrofera Blue. She has a new area on the left anterior lower tibial area. This was a wrap injury last week. 6/19; the patient is using Hydrofera Blue. She arrived with marked inflammation and  erythema around the wound and  tenderness. 12/01/18 on evaluation today patient appears to be doing a little bit better based on what I'm hearing from the standpoint of lassos evaluation to this as far as the overall appearance of the wound is concerned. Then sometime substandard she typically sees Dr. Dellia Nims. Nonetheless overall very pleased with the progress that she's made up to this point. No fevers, chills, nausea, or vomiting noted at this time. 7/10; some improvement in the surface area. Aggressively debrided last week apparently. I went ahead with the debridement today although the patient does not tolerate this very well. We have been using Iodoflex. Still a fair amount of drainage 7/17; slightly smaller. Using Iodoflex. 7/24; no change from last week in terms of surface area. We have been using Iodoflex. Surface looks and continues to look somewhat better 7/31; surface area slightly smaller better looking surface. We have been using Iodoflex. This is under Unna boot compression 8/7-Patient presents at 1 week with Unna boot and Iodoflex, wound appears better 8/14-Patient presents at 1 week with Iodoflex, we use the Unna boot, wound appears to be stable better.Patient is getting Botox treatment for the inversion of the foot for tendon release, Next week 8/21; we are using Iodoflex. Unna boot. The wound is stable in terms of surface area. Under illumination there is some areas of the wound that appear to be either epithelialized or perhaps this is adherent slough at this point I was not really clear. It did not wipe off and I was reluctant to debride this today. 8/28; we are using Iodoflex in an Unna boot. Seems to be making good improvement. 9/4; using Iodoflex and wound is slightly smaller. 9/18; we are using Iodoflex with topical silver nitrate when she is here. The wound continues to be smaller 10/2; patient missed her appointment last week due to GI issues. She left and Iodoflex based  dressing on for 2 weeks. Wound is about the same size about the size of a dime on the left medial lower 10/9 we have been using Iodoflex on the medial left ankle wound. She has a new superficial probable wrap injury on the dorsal left ankle 10/16; we have been using Hydrofera Blue since last week. This is on the left medial ankle 10/23; we have been using Hydrofera Blue since 2 weeks ago. This is on the left medial ankle. Dimensions are better 11/6; using Hydrofera Blue. I think the wound is smaller but still not closed. Left medial ankle 11/13; we have been using Hydrofera Blue. Wound is certainly no smaller this week. Also the surface not as good. This is the remanent of a very large area on her left medial ankle. 11/20; using Sorbact since last week. Wound was about the same in terms of size although I was disappointed about the surface debris 12/11; 3-week follow-up. Patient was on vacation. Wound is measuring slightly larger we have been using Sorbact. 12/18; wound is about the same size however surface looks better last week after debridement. We have been using Sorbact under compression 1/15 wound is probably twice the size of last time increased in length nonviable surface. We have been using Sorbact. She was running a mild fever and missed her appointment last week 1/22; the wound is come down in size but under illumination still a very adherent debris we have been Hydrofera Blue that I changed her to last week 1/29; dimensions down slightly. We have been using Hydrofera Blue 2/19 dimensions are the same however there is rims of epithelialization under  illumination. Therefore more the surface area may be epithelialized 2/26; the patient's wound actually measures smaller. The wound looks healthy. We have been using Hydrofera Blue. I had some thoughts about running Apligraf then I still may do that however this looks so much better this week we will delay that for now 3/5; the wound is small  but about the same as last week. We have been using Hydrofera Blue. No debridement is required today. 3/19; the wound is about the size of a dime. Healthy looking wound even under illumination. We have been using Hydrofera Blue. No mechanical debridement is necessary 3/26; not much change from last week although still looks very healthy. We have been using Hydrofera Blue under Unna boots Patient was offered an ankle fusion by podiatry but not until the wound heals with a proceed with this. 4/9; the patient comes in today with her original wound on the medial ankle looking satisfactory however she has some uncontrolled swelling in the middle part of her leg with 2 new open areas superiorly just lateral to the tibia. I think this was probably a wrap issue. She said she felt uncomfortable during the week but did not call in. We have been using Hydrofera Blue 4/16; the wound on the medial ankle is about the same. She has innumerable small areas superior to this across her mid tibia. I think this is probably folliculitis. She is also been working in the yard doing a lot of sweating 4/30; the patient issue on the upper areas across her mid tibia of all healed. I think this was excessive yard work if I remember. Her wound on the medial ankle is smaller. Some debris on this we have been using Hydrofera Blue under Unna boots 5/7; mid tibia. She has been using Hydrofera Blue under an Unna wrap. She is apparently going for her ankle surgery on June 3 10/28/19-Patient returns to clinic with the ankle wound, we are using Hydrofera Blue under Unna wrap, surgery is scheduled for her left foot for June 3 so she will be back for nurse visit next week READMISSION 01/17/2020 Mrs. Faubert is a 64 year old woman we have had in this clinic for a long period of time with severe venous hypertension and refractory wounds on her medial lower legs and ankles bilaterally. 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 When she was off her legs for a prolonged period example when she fell and suffered a compression fracture things would heal up quite nicely. She is now retired and we managed to heal up the right medial leg wound. The left one was very tiny last time I saw this although still refractory. 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 It got to the point where this was interfering with her walking and she elected to proceed with a ankle arthrodesis to straighten her her ankle and leave her with a functional outcome for mobilization. The patient was referred to Dr. Doren Custard and really this took some time to arrange. Dr. Doren Custard saw her on 12/07/2019. Once again he verified that she had no arterial issues. She had previously had an angiogram several years ago. Follow-up ABIs on the left showed an ABI of 1.12 with triphasic waveforms and a TBI of 0.92. She is felt to have chronic  deep venous insufficiency but I do not think it was felt that anything could be done from about this from an ablation point of view. At the time Dr. Doren Custard saw this patient the wounds actually look closed via the pictures in his clinic. The patient finally underwent her surgery on 12/15/2019. This went reasonably well and there was a good anatomic outcome. She developed a small distal wound dehiscence on the lateral part of the surgical wound. However more problematically she is developed recurrence of the wound on the medial left ankle. There are actually 2 wounds here one in the distal lower leg and 1 pretty much at the level of the medial malleolus. It is a more distal area that is more problematic. She has been using Hydrofera Blue which started on Friday before this she was simply Ace wrapping. There was a culture done that showed Pseudomonas  and she is on ciprofloxacin. A recent CNS on 8/11 was negative. The patient reports some pain but I generally think this is improving. She is using a cam boot completely nonweightbearing using a walker for pivot transfers and a wheelchair 8/24; not much improvement unfortunately she has a surgical wound on the lateral part in the venous insufficiency wound medially. The bottom part of the medial insufficiency wound is still necrotic there is exposed tendon here. We have been using Hydrofera Blue under compression. Her edema control is however better 8/31; patient in for follow-up of his surgical wound on the lateral part of her left leg and chronic venous insufficiency ulcers medially. We put her back in compression last week. She comes in today with a complaint of 3 or 4 days worth of increasing pain. She felt her cam walker was rubbing on the area on the back of her heel. However there is intense erythema seems more likely she has cellulitis. She had 2 cultures done when she was seeing podiatry in the postop. One of them in late July showed Pseudomonas and she received a course of ciprofloxacin the other was negative on 8/11 she is allergic to penicillin with anaphylactoid complaints of hives oral swelling via information in epic 9/9; when I saw this patient last week she had intense anterior erythema around her wound on the right lateral heel and ankle and also into the right medial heel. Some of this was no doubt drainage and her walker boot however I was convinced she had cellulitis. I gave her Levaquin and Bactrim she is finishing up on this now. She is following up with Dr. [name] he saw her yesterday. He is taken her out of the walking boot of course she is still nonweightbearing. Her x-ray was negative for any worrisome features such as soft tissue air etc. Things are a lot better this week. She has home health. We have been using Hydrofera Blue under an [employer] which she put back on  yesterday. I did not wrap her last week 9/17; her surrounding skin looks a lot better. In fact the area on the left lateral ankle has just a scant amount of eschar. The only remaining wound is the large area on the left medial ankle. Probably about 60% of this is healthy granulation at the surface however she has a significant divot distally. This has adherent debris in it. I been using debridement and silver collagen to try and get this area to fill-in although I do not think we have made much progress this week 9/24; the patient's wound on the left medial ankle  looks a lot better. The deeper divot area distally still requires debridement but this is cleaning up quite nicely we have been using silver collagen. The patient is complaining of swelling in her foot and is worried that that is contributing to the nonhealing of the ankle wound. She is also complaining of numbness in her anterior toes 10/4; left medial ankle. The small area distally still has a divot with necrotic material that I have been debriding away. This has an undermining area. She is approved for Apligraf. She saw Dr. [name] her surgeon on 10/1. I think he declared himself is satisfied with the condition of things. Still nonweightbearing till the end of the month. 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 Her surgical wound is well closed. There is no evidence of infection Objective Constitutional Sitting or standing Blood Pressure is within target range for patient.. Pulse regular and within target range for patient.Marland Kitchen Respirations regular, non-labored and within target range.. Temperature is normal and within the target range for the patient.Marland Kitchen Appears in no distress. Vitals Time Taken: 9:29 AM, Height: 68 in, Temperature: 98.6 F, Pulse: 60 bpm, Respiratory Rate: 18 breaths/min, Blood Pressure: 116/58 mmHg. General Notes: Wound exam; left medial ankle. I used a #3 curette to remove necrotic debris  from the bottom of this in the divot. There is undermining but no exposed bone. The rest of the tissue in this large area looks satisfactory. No debridement is required Integumentary (Hair, Skin) Wound #15 status is Open. Original cause of wound was Gradually Appeared. The wound is located on the Left,Medial Malleolus. The wound measures 6cm length x 3cm width x 0.7cm depth; 14.137cm^2 area and 9.896cm^3 volume. There is tendon and Fat Layer (Subcutaneous Tissue) exposed. There is no undermining noted, however, there is tunneling at 6:00 with a maximum distance of 1.2cm. There is a medium amount of serosanguineous drainage noted. The wound margin is flat and intact. There is large (67-100%) red, pink granulation within the wound bed. There is a small (1-33%) amount of necrotic tissue within the wound bed including Adherent Slough. 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 There was a Excisional Skin/Subcutaneous Tissue Debridement with a total area of 1.3 sq cm performed by [name]., MD. With the following instrument(s): Curette to remove Viable and Non-Viable tissue/material. Material removed includes Subcutaneous Tissue and Slough and. No specimens were taken. A time out was conducted at 09:42, prior to the start of the procedure. A Minimum amount of bleeding was controlled with Pressure. The procedure was tolerated well with a pain level of 6 throughout and a pain level of 4 following the procedure. Post Debridement Measurements: 6cm length x 3cm width x 0.7cm depth;  9.896cm^3 volume. Character of Wound/Ulcer Post Debridement is improved. Severity of Tissue Post Debridement is: Fat layer exposed. Post procedure Diagnosis Wound #15: Same as Pre-Procedure Pre-procedure diagnosis of Wound #15 is a Venous Leg Ulcer located on the Left,Medial Malleolus . There was a Haematologist Compression Therapy Procedure by Deon Pilling, RN. Post procedure Diagnosis Wound #15: Same as Pre-Procedure Plan Follow-up Appointments: Return Appointment in 1 week. Dressing Change  Frequency: Other: - home health to change dressing twice a week. Skin Barriers/Peri-Wound Care: Moisturizing lotion - to leg Wound Cleansing: May shower with protection. - use cast protector Primary Wound Dressing: Wound #15 Left,Medial Malleolus: Silver Collagen - moisten with hydrogel., be sure to tuck into tunnel at 6 o'clock Secondary Dressing: Wound #15 Left,Medial Malleolus: Dry Gauze ABD pad Edema Control: Unna Boot to Left Lower Extremity Avoid standing for long periods of time Elevate legs to the level of the heart or above for 30 minutes daily and/or when sitting, a frequency of: - throughout the day Off-Loading: Other: - ensure no pressure to wound areas. Home Health: [facility] for wound care. - [facility] to change twice a week. 1. Silver collagen moistened with hydrogel tucked into the tunnel at 6:00. 2. ABDs 3. Unna boots 4. Apligraf application next week. She will need a particularly aggressive debridement distally and I talked to her about this. Electronic Signature(s) Signed: 03/05/2020 4:54:01 PM By: Linton Ham MD Entered By: Linton Ham on 03/05/2020 10:24:07 -------------------------------------------------------------------------------- SuperBill Details Patient Name: Date of Service: [name], [name] 03/05/2020 Medical Record Number: [id number] Patient Account Number: [id number] Date of Birth/Sex: Treating RN: 1955/10/30  (64 y.o. [name] Provider: [name] Other Clinician: Referring Provider: Treating Provider/Extender: [name] in Treatment: 6 Diagnosis Coding ICD-10 Codes Code Description [phone] Chronic venous hypertension (idiopathic) with ulcer and inflammation of left lower extremity L97.828 Non-pressure chronic ulcer of other part of left lower leg with other specified severity L03.116 Cellulitis of left lower limb L97.328 Non-pressure chronic ulcer of left ankle with other specified severity T81.31XD Disruption of external operation (surgical) wound, not elsewhere classified, subsequent encounter Facility Procedures CPT4 Code: [id number] Description: 64680 - DEB SUBQ TISSUE 20 SQ CM/< ICD-10 Diagnosis Description L97.328 Non-pressure chronic ulcer of left ankle with other specified severity Modifier: Quantity: 1 Physician Procedures Electronic Signature(s) Signed: 03/05/2020 4:54:01 PM By: Linton Ham MD Entered By: Linton Ham on 03/05/2020 10:24:26

## 2020-03-05 NOTE — Progress Notes (Signed)
ALLEYA, DEMETER ([id number]) Visit Report for 03/05/2020 Arrival Information Details Patient Name: Date of Service: [facility] [name], [name] 03/05/2020 9:00 A M Medical Record Number: [id number] Patient Account Number: [id number] Date of Birth/Sex: Treating RN: 1956-01-23 (64 y.o. [name] Primary Care Venkat Ankney: [name] Other Clinician: Referring Zyshawn Bohnenkamp: Treating Dhrithi Riche/Extender: [name] in Treatment: 6 Visit Information History Since Last Visit All ordered tests and consults were completed: No Patient Arrived: [name] Added or deleted any medications: No Arrival Time: 09:27 Any new allergies or adverse reactions: No Accompanied By: self Had a fall or experienced change in No Transfer Assistance: None activities of daily living that may affect Patient Identification Verified: Yes risk of falls: Secondary Verification Process Completed: Yes Signs or symptoms of abuse/neglect since last visito No Patient Requires Transmission-Based Precautions: No Hospitalized since last visit: No Patient Has Alerts: Yes Implantable device outside of the clinic excluding No Patient Alerts: L ABI =1.12, TBI = .92 cellular tissue based products placed in the center R ABI= 1.02, TBI= .58 since last visit: Has Dressing in Place as Prescribed: Yes Has Compression in Place as Prescribed: Yes Pain Present Now: No Electronic Signature(s) Signed: 03/05/2020 5:20:03 PM By: [name] RN Entered By: [name] on 03/05/2020 09:29:16 -------------------------------------------------------------------------------- Compression Therapy Details Patient Name: Date of Service: [name], [name] 03/05/2020 9:00 A M Medical Record Number: [id number] Patient Account Number: [id number] Date of Birth/Sex: Treating RN: 02-Nov-1955 (64 y.o. [name] Dakwon Wenberg: [name] Other Clinician: Referring Derec Mozingo: Treating Kirke Breach/Extender: [name] in Treatment: 6 Compression Therapy Performed for Wound Assessment: Wound #15 Left,Medial Malleolus Performed By: Clinician Deon Pilling, RN Compression Type: Rolena Infante Post Procedure Diagnosis Same as Pre-procedure Electronic Signature(s) Signed: 03/05/2020 5:03:51 PM By: Baruch Gouty RN, BSN Entered By: Baruch Gouty on 03/05/2020 09:39:36 -------------------------------------------------------------------------------- Encounter Discharge Information Details Patient Name: Date of Service: [name], [name] 03/05/2020 9:00 A M Medical Record Number: [id number] Patient Account Number: [id number] Date of Birth/Sex: Treating RN: 1956-03-24 (64 y.o. [name] Primary Care Beverly Suriano: [name] Other Clinician: Referring Ahmari Garton: Treating Payson Crumby/Extender: [name] in Treatment: 6 Encounter Discharge Information Items Post Procedure Vitals Discharge Condition: Stable Temperature (F): 98.6 Ambulatory Status: Walker Pulse (bpm): 60 Discharge Destination: Home Respiratory Rate (breaths/min): 18 Transportation: Private Auto Blood Pressure (mmHg): 116/58 Accompanied By: self Schedule Follow-up Appointment: Yes Clinical Summary of Care: Electronic Signature(s) Signed: 03/05/2020 4:56:48 PM By: Deon Pilling Entered By: Deon Pilling on 03/05/2020 10:07:37 -------------------------------------------------------------------------------- Lower Extremity Assessment Details Patient Name: Date of Service: [facility] [name], [name] 03/05/2020 9:00 A M Medical Record Number: [id number] Patient Account Number: [id number] Date of Birth/Sex: Treating RN: Apr 30, 1956 (64 y.o. [name] Primary Care Lempi Edwin: [name] Other Clinician: Referring Brileigh Sevcik: Treating [name] Osterman/Extender: [name] in Treatment: 6 Edema Assessment Assessed: [Left: No] [Right: No] Edema: [Left: Ye] [Right:  s] Calf Left: Right: Point of Measurement: From Medial Instep 30 cm Ankle Left: Right: Point of Measurement: From Medial Instep 20 cm Electronic Signature(s) Signed: 03/05/2020 5:20:03 PM By: [name] RN Entered By: [name] on 03/05/2020 09:30:33 -------------------------------------------------------------------------------- Multi Wound Chart Details Patient Name: Date of Service: [name], [name] 03/05/2020 9:00 A M Medical Record Number: [id number] Patient Account Number: [id number] Date of Birth/Sex: Treating RN: 1955-12-02 (64 y.o. [name] Desyre Calma: [name] Other Clinician: Referring Yailyn Strack: Treating Jammi Morrissette/Extender:  [name] in Treatment: 6 Vital Signs Height(in): 68 Pulse(bpm): 60 Weight(lbs): Blood Pressure(mmHg): 116/58 Body Mass Index(BMI): Temperature(F): 98.6 Respiratory Rate(breaths/min): 18 Photos: [15:No Photos Left, Medial Malleolus] [N/A:N/A N/A] Wound Location: [15:Gradually Appeared] [N/A:N/A] Wounding Event: [15:Venous Leg Ulcer] [N/A:N/A] Primary Etiology: [15:Peripheral Venous Disease,] [N/A:N/A] Comorbid History: [15:Osteoarthritis, Confinement Anxiety 12/30/2019] [N/A:N/A] Date Acquired: [15:6] [N/A:N/A] Weeks of Treatment: [15:Open] [N/A:N/A] Wound Status: [15:6x3x0.7] [N/A:N/A] Measurements L x W x D (cm) [15:14.137] [N/A:N/A] A (cm) : rea [15:9.896] [N/A:N/A] Volume (cm) : [15:-21.20%] [N/A:N/A] % Reduction in A [15:rea: -6.10%] [N/A:N/A] % Reduction in Volume: [15:6] Position 1 (o'clock): [15:1.2] Maximum Distance 1 (cm): [15:Yes] [N/A:N/A] Tunneling: [15:Full Thickness With Exposed Support N/A] Classification: [15:Structures Medium] [N/A:N/A] Exudate A mount: [15:Serosanguineous] [N/A:N/A] Exudate Type: [15:red, brown] [N/A:N/A] Exudate Color: [15:Flat and Intact] [N/A:N/A] Wound Margin: [15:Large (67-100%)] [N/A:N/A] Granulation A mount: [15:Red, Pink]  [N/A:N/A] Granulation Quality: [15:Small (1-33%)] [N/A:N/A] Necrotic A mount: [15:Fat Layer (Subcutaneous Tissue): Yes N/A] Exposed Structures: [15:Tendon: Yes Fascia: No Muscle: No Joint: No Bone: No Small (1-33%)] [N/A:N/A] Epithelialization: [15:Debridement - Excisional] [N/A:N/A] Debridement: Pre-procedure Verification/Time Out 09:42 [N/A:N/A] Taken: [15:Subcutaneous, Slough] [N/A:N/A] Tissue Debrided: [15:Skin/Subcutaneous Tissue] [N/A:N/A] Level: [15:1.3] [N/A:N/A] Debridement A (sq cm): [15:rea Curette] [N/A:N/A] Instrument: [15:Minimum] [N/A:N/A] Bleeding: [15:Pressure] [N/A:N/A] Hemostasis A chieved: [15:6] [N/A:N/A] Procedural Pain: [15:4] [N/A:N/A] Post Procedural Pain: [15:Procedure was tolerated well] [N/A:N/A] Debridement Treatment Response: [15:6x3x0.7] [N/A:N/A] Post Debridement Measurements L x W x D (cm) [15:9.896] [N/A:N/A] Post Debridement Volume: (cm) [15:Compression Therapy] [N/A:N/A] Procedures Performed: [15:Debridement] Treatment Notes Wound #15 (Left, Medial Malleolus) 1. Cleanse With Wound Cleanser Soap and water 2. Periwound Care Barrier cream Moisturizing lotion 3. Primary Dressing Applied Collegen AG Hydrogel or K-Y Jelly 4. Secondary Dressing ABD Pad Dry Gauze 6. Support Layer Production assistant, radio Notes no kerlix with unna boot patient request. Engineer, maintenance) Signed: 03/05/2020 4:54:01 PM By: Linton Ham MD Signed: 03/05/2020 5:03:51 PM By: Baruch Gouty RN, BSN Entered By: Linton Ham on 03/05/2020 10:20:10 -------------------------------------------------------------------------------- Multi-Disciplinary Care Plan Details Patient Name: Date of Service: [facility] [name], [name] 03/05/2020 9:00 A M Medical Record Number: [id number] Patient Account Number: [id number] Date of Birth/Sex: Treating RN: 05-17-1956 (64 y.o. [name] Raneem Mendolia: [name] Other Clinician: Referring Addie Alonge: Treating  Brix Brearley/Extender: [name] in Treatment: 6 Active Inactive Abuse / Safety / Falls / Self Care Management Nursing Diagnoses: Potential for falls Potential for injury related to falls Goals: Patient will not experience any injury related to falls Date Initiated: 01/17/2020 Target Resolution Date: 03/09/2020 Goal Status: Active Patient/caregiver will verbalize/demonstrate measures taken to prevent injury and/or falls Date Initiated: 01/17/2020 Target Resolution Date: 03/09/2020 Goal Status: Active Interventions: Assess Activities of Daily Living upon admission and as needed Assess fall risk on admission and as needed Assess: immobility, friction, shearing, incontinence upon admission and as needed Assess impairment of mobility on admission and as needed per policy Assess personal safety and home safety (as indicated) on admission and as needed Assess self care needs on admission and as needed Provide education on fall prevention Provide education on personal and home safety Notes: Wound/Skin Impairment Nursing Diagnoses: Impaired tissue integrity Knowledge deficit related to ulceration/compromised skin integrity Goals: Patient/caregiver will verbalize understanding of skin care regimen Date Initiated: 01/17/2020 Target Resolution Date: 03/09/2020 Goal Status: Active Ulcer/skin breakdown will have a volume reduction of 30% by week 4 Date Initiated: 01/17/2020 Target Resolution Date: 03/09/2020 Goal Status: Active Interventions: Assess patient/caregiver ability to obtain necessary supplies Assess patient/caregiver  ability to perform ulcer/skin care regimen upon admission and as needed Assess ulceration(s) every visit Provide education on ulcer and skin care Notes: Electronic Signature(s) Signed: 03/05/2020 5:03:51 PM By: Baruch Gouty RN, BSN Entered By: Baruch Gouty on 03/05/2020  09:36:37 -------------------------------------------------------------------------------- Pain Assessment Details Patient Name: Date of Service: [name], [name] 03/05/2020 9:00 A M Medical Record Number: [id number] Patient Account Number: [id number] Date of Birth/Sex: Treating RN: [birth year] (64 y.o. [name] Primary Care Briyana Badman: [name] Other Clinician: Referring Nilam Quakenbush: Treating Izeah Vossler/Extender: [name] in Treatment: 6 Active Problems Location of Pain Severity and Description of Pain Patient Has Paino No Site Locations Pain Management and Medication Current Pain Management: Electronic Signature(s) Signed: 03/05/2020 5:20:03 PM By: [name] RN Entered By: [name] on 03/05/2020 09:29:55 -------------------------------------------------------------------------------- Patient/Caregiver Education Details Patient Name: Date of Service: [name], [name] 10/4/2021andnbsp9:00 A M Medical Record Number: [id number] Patient Account Number: [id number] Date of Birth/Gender: Treating RN: Jul 03, 1955 (64 y.o. [name] Physician: [name] Other Clinician: Referring Physician: Treating Physician/Extender: [name] in Treatment: 6 Education Assessment Education Provided To: Patient Education Topics Provided Venous: Methods: Explain/Verbal Responses: Reinforcements needed, State content correctly Wound/Skin Impairment: Methods: Explain/Verbal Responses: Reinforcements needed, State content correctly Electronic Signature(s) Signed: 03/05/2020 5:03:51 PM By: Baruch Gouty RN, BSN Entered By: Baruch Gouty on 03/05/2020 09:37:04 -------------------------------------------------------------------------------- Wound Assessment Details Patient Name: Date of Service: [name], [name] 03/05/2020 9:00 A M Medical Record Number: [id number] Patient Account Number:  [id number] Date of Birth/Sex: Treating RN: 04-09-1956 (64 y.o. [name] Primary Care Skylier Kretschmer: [name] Other Clinician: Referring Tiffane [name]: Treating Markham Dumlao/Extender: [name] in Treatment: 6 Wound Status Wound Number: 15 Primary Venous Leg Ulcer Etiology: Wound Location: Left, Medial Malleolus Wound Status: Open Wounding Event: Gradually Appeared Comorbid Peripheral Venous Disease, Osteoarthritis, Confinement Date Acquired: 12/30/2019 History: Anxiety Weeks Of Treatment: 6 Clustered Wound: No Wound Measurements Length: (cm) 6 Width: (cm) 3 Depth: (cm) 0.7 Area: (cm) 14.137 Volume: (cm) 9.896 % Reduction in Area: -21.2% % Reduction in Volume: -6.1% Epithelialization: Small (1-33%) Tunneling: Yes Position (o'clock): 6 Maximum Distance: (cm) 1.2 Undermining: No Wound Description Classification: Full Thickness With Exposed Support Structures Wound Margin: Flat and Intact Exudate Amount: Medium Exudate Type: Serosanguineous Exudate Color: red, brown Foul Odor After Cleansing: No Slough/Fibrino Yes Wound Bed Granulation Amount: Large (67-100%) Exposed Structure Granulation Quality: Red, Pink Fascia Exposed: No Necrotic Amount: Small (1-33%) Fat Layer (Subcutaneous Tissue) Exposed: Yes Necrotic Quality: Adherent Slough Tendon Exposed: Yes Muscle Exposed: No Joint Exposed: No Bone Exposed: No Treatment Notes Wound #15 (Left, Medial Malleolus) 1. Cleanse With Wound Cleanser Soap and water 2. Periwound Care Barrier cream Moisturizing lotion 3. Primary Dressing Applied Collegen AG Hydrogel or K-Y Jelly 4. Secondary Dressing ABD Pad Dry Gauze 6. Support Layer Production assistant, radio Notes no kerlix with unna boot patient request. Engineer, maintenance) Signed: 03/05/2020 5:03:51 PM By: Baruch Gouty RN, BSN Signed: 03/05/2020 5:20:03 PM By: [name] RN Entered By: Baruch Gouty on 03/05/2020  09:50:47 -------------------------------------------------------------------------------- Vitals Details Patient Name: Date of Service: [name], [name] 03/05/2020 9:00 A M Medical Record Number: [id number] Patient Account Number: [id number] Date of Birth/Sex: Treating RN: March 24, 1956 (64 y.o. [name] Primary Care Jadon Harbaugh: [name] Other Clinician: Referring Alailah Safley: Treating Cutter Passey/Extender: [name] in Treatment: 6 Vital Signs Time Taken: 09:29 Temperature (F): 98.6 Height (in): 68 Pulse (  bpm): 60 Respiratory Rate (breaths/min): 18 Blood Pressure (mmHg): 116/58 Reference Range: 80 - 120 mg / dl Electronic Signature(s) Signed: 03/05/2020 5:20:03 PM By: [name] RN Entered By: [name] on 03/05/2020 09:29:46

## 2020-03-06 ENCOUNTER — Other Ambulatory Visit: Payer: Self-pay | Admitting: Podiatry

## 2020-03-06 DIAGNOSIS — Z9889 Other specified postprocedural states: Secondary | ICD-10-CM

## 2020-03-12 ENCOUNTER — Encounter (HOSPITAL_BASED_OUTPATIENT_CLINIC_OR_DEPARTMENT_OTHER): Payer: Medicare Other | Admitting: Internal Medicine

## 2020-03-12 ENCOUNTER — Other Ambulatory Visit (HOSPITAL_COMMUNITY)
Admission: RE | Admit: 2020-03-12 | Discharge: 2020-03-12 | Disposition: A | Discharge status: Home / Self Care | Payer: Medicare Other | Source: Other Acute Inpatient Hospital | Attending: Internal Medicine | Admitting: Internal Medicine

## 2020-03-12 DIAGNOSIS — L97328 Non-pressure chronic ulcer of left ankle with other specified severity: Secondary | ICD-10-CM | POA: Diagnosis present

## 2020-03-12 DIAGNOSIS — I87332 Chronic venous hypertension (idiopathic) with ulcer and inflammation of left lower extremity: Secondary | ICD-10-CM | POA: Diagnosis not present

## 2020-03-12 NOTE — Progress Notes (Signed)
SHERRINE, SALBERG ([id number]) Visit Report for 03/12/2020 Debridement Details Patient Name: Date of Service: [facility] [name], [name] 03/12/2020 10:00 A M Medical Record Number: [id number] Patient Account Number: [id number] Date of Birth/Sex: Treating RN: 1955/12/21 (64 y.o. [name] Primary Care Provider: [name] Other Clinician: Referring Provider: Treating Provider/Extender: [name] in Treatment: 7 Debridement Performed for Assessment: Wound #15 Left,Medial Malleolus Performed By: Physician [name]., MD Debridement Type: Debridement Severity of Tissue Pre Debridement: Fat layer exposed Level of Consciousness (Pre-procedure): Awake and Alert Pre-procedure Verification/Time Out Yes - 10:35 Taken: Start Time: 10:35 T Area Debrided (L x W): otal 5.3 (cm) x 2.9 (cm) = 15.37 (cm) Tissue and other material debrided: Viable, Non-Viable, Slough, Subcutaneous, Slough Level: Skin/Subcutaneous Tissue Debridement Description: Excisional Instrument: Curette Specimen: Swab, Number of Specimens T aken: 1 Bleeding: Minimum Hemostasis Achieved: Pressure End Time: 10:36 Procedural Pain: 5 Post Procedural Pain: 3 Response to Treatment: Procedure was tolerated well Level of Consciousness (Post- Awake and Alert procedure): Post Debridement Measurements of Total Wound Length: (cm) 5.3 Width: (cm) 2.9 Depth: (cm) 0.9 Volume: (cm) 10.864 Character of Wound/Ulcer Post Debridement: Improved Severity of Tissue Post Debridement: Fat layer exposed Post Procedure Diagnosis Same as Pre-procedure Electronic Signature(s) Signed: 03/12/2020 5:03:49 PM By: Linton Ham MD Signed: 03/12/2020 5:10:40 PM By: Levan Hurst RN, BSN Entered By: Linton Ham on 03/12/2020 10:43:31 -------------------------------------------------------------------------------- HPI Details Patient Name: Date of Service: [name], [name] 03/12/2020 10:00 A  M Medical Record Number: [id number] Patient Account Number: [id number] Date of Birth/Sex: Treating RN: 1955/12/16 (64 y.o. [name] Primary Care Provider: [name] Other Clinician: Referring Provider: Treating Provider/Extender: [name] in Treatment: 7 History of Present Illness HPI Description: the remaining wound is over the left medial ankle. Similar wound over the right medial ankle healed largely with use of Apligraf. Most recently we have been using Hydrofera Blue over this wound with considerable improvement. The patient has been extensively worked up in the past for her venous insufficiency and she is not a candidate for antireflux surgery although I have none of the details available currently. 08/24/14; considerable improvement today. About 50% of this wound areas now epithelialized. The base of the wound appears to be healthier granulation.as opposed to last week when she had deteriorated a considerable improvement 08/17/14; unfortunately the wound has regressed somewhat. The areas of epithelialization from the superior aspect are not nearly as healthy as they were last week. The patient thinks her Hydrofera Blue slipped. 09/07/14; unfortunately the area has markedly regressed in the 2 weeks since I've seen this. There is an odor surrounding erythema. The healthy granulation tissue that we had at the base of the wound now is a dusky color. The nurse reports green drainage 09/14/14; the area looks somewhat better than last week. There is less erythema and less drainage. The culture I did did not show any growth. Nevertheless I think it is better to continue the Cipro and doxycycline for a further week. The remaining wound area was debridement. 09/21/14. Wound did not require debridement last week. Still less erythema and less drainage. She can complete her antibiotics. The areas of epithelialization in the superior aspect of the wound do not look as  healthy as they did some weeks ago 10/05/14 continued improvement in the condition of this wound. There is advancing epithelialization. Less aggressive debridement required 10/19/14 continued improvement in the condition and volume of this wound. Less aggressive  debridement to the inferior part of this to remove surface slough and fibrinous eschar 11/02/14 no debridement is required. The surface granulation appears healthy although some of her islands of epithelialization seem to have regressed. No evidence of infection 11/16/14; lites surface debridement done of surface eschar. The wound does not look to be unhealthy. No evidence of infection. Unfortunately the patient has had podiatry issues in the right foot and for some reason has redeveloped small surface ulcerations in the medial right ankle. Her original presentation involved wounds in this area 11/23/14 no debridement. The area on the right ankle has enlarged. The left ankle wound appears stable in terms of the surface although there is periwound inflammation. There has been regression in the amount of new skin 11/30/14 no debridement. Both wound areas appear healthy. There was no evidence of infection. The the new area on the right medial ankle has enlarged although that both the surfaces appear to be stable. 12/07/14; Debridement of the right medial ankle wound. No no debridement was done on the left. 12/14/14 no major change in and now bilateral medial ankle wounds. Both of these are very painful but the no overt evidence of infection. She has had previous venous ablation 12/21/14; patient states that her right medial ankle wound is considerably more painful last week than usual. Her left is also somewhat painful. She could not tolerate debridement. The right medial ankle wound has fibrinous surface eschar 12/28/14 this is a patient with severe bilateral venous insufficiency ulcers. For a considerable period of time we actually had the one on the  right medial ankle healed however this recently opened up again in June. The left medial ankle wound has been a refractory area with some absent flows. We had some success with Hydrofera Blue on this area and it literally closed by 50% however it is recently opened up Foley. Both of these were debridement today of surface eschar. She tolerates this poorly 01/25/15: No change in the status of this. Thick adherent escar. Very poor tolerance of any attempt at debridement. I had healed the right medial malleolus wound for a considerable amount of time and had the left one down to about 50% of the volume although this is totally regressed over the last 48 weeks. Further the right leg has reopened. she is trying to make a appointment with pain and vascular, previous ablations with Dr. Aleda Grana. I do not believe there is an arterial insufficiency issue here 02/01/15 the status of the adherent eschar bilaterally is actually improved. No debridement was done. She did not manage to get vascular studies done 02/08/15 continued debridement of the area was done today. The slough is less adherent and comes off with less pressure. There is no surrounding infection peripheral pulses are intact 02/15/15 selective debridement with a disposable curette. Again the slough is less adherent and comes off with less difficulty. No surrounding infection peripheral pulses are intact. 02/22/15 selective debridement of the right medial ankle wound. Slough comes off with less difficulty. No obvious surrounding infection peripheral pulses are intact I did not debridement the one on the left. Both of these are stable to improved 03/01/15 selective debridement of both wound areas using a curette to. Adherent slough cup soft with less difficulty. No obvious surrounding infection. The patient tells me that 2 days ago she noted a rash above the right leg wrap. She did not have this on her lower legs when she change this over she arrives  with widespread left greater than  right almost folliculitis-looking rash which is extremely pruritic. I don't see anything to culture here. There is no rash on the rest of her body. She feels well systemically. 03/08/15; selective debridement of both wounds using a curette. Base of this does not look unhealthy. She had limegreen drainage coming out of the left leg wound and describes a lot of drainage. The rash on her left leg looks improved to. No cultures were done. 03/22/15; patient was not here last week. Basal wounds does not look healthy and there is no surrounding erythema. No drainage. There is still a rash on the left leg that almost looks vasculitic however it is clearly limited to the top of where the wrap would be. 04/05/15; on the right required a surgical debridement of surface eschar and necrotic subcutaneous tissue. I did not debridement the area on the left. These continue to be large open wounds that are not changing that much. We were successful at one point in healing the area on the right, and at the same time the area on the left was roughly half the size of current measurements. I think a lot of the deterioration has to do with the prolonged time the patient is on her feet at work 04/19/15 I attempted-like surface debridement bilaterally she does not tolerate this. She tells me that she was in allergic care yesterday with extreme pain over her left lateral malleolus/ankle and was told that she has an "sprain" 05/03/15; large bilateral venous insufficiency wounds over the medial malleolus/medial aspect of her ankles. She complains of copious amounts of drainage and his usual large amounts of pain. There is some increasing erythema around the wound on the right extending into the medial aspect of her foot to. historically she came in with these wounds the right one healed and the left one came down to roughly half its current size however the right one is reopened and the left  is expanded. 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 05/10/15 large bilateral venous insufficiency wounds. There is less adherence surface left however the surface culture that I did last week grew pseudomonas therefore bilateral selective debridement score necessary. There is surrounding erythema. The patient describes severe bilateral drainage and a lot of pain in the left ankle. Apparently her podiatrist was were ready to do a cortisone shot 05/17/15; the patient complains of pain and again copious amounts of drainage. 05/24/15; we used Iodo flex last week. Patient notes considerable improvement in wound drainage. Only needed to change this once. 05/31/15; we continued Iodoflex; the base of these large wounds bilaterally is not too bad but there is probably likely a significant bioburden here. I would like to debridement just doesn't tolerate it. 06/06/14 I would like to continue the Iodoflex although she still hasn't managed to obtain supplies. She has bilateral medial malleoli or large wounds which are mostly superficial. Both of them are covered circumferentially with some nonviable fibrinous slough although she tolerates debridement very poorly. She apparently has an appointment for an ablation on the right leg by interventional radiology. 06/14/15; the patient arrives with the wounds and static condition. We attempted a debridement although she does not do well with this secondary to pain. I 07/05/15; wounds are not much smaller however there appears to be a cleaner granulating base. The left has tight fibrinous slough greater than the right. Debridement is tolerated poorly due to pain. Iodoflex is done more  for these wounds in any of the multitude of different dressings I have tried on the left 1 and then subsequently the right. 07/12/15; no change in the condition of this wound. I am able to do an aggressive debridement on the right but not the  left. She simply cannot tolerate it. We have been using Iodoflex which helps somewhat. It is worthwhile remembering that at one point we healed the right medial ankle wound and the left was about 25% of the current circumference. We have suggested returning to vascular surgery for review of possible further ablations for one reason or another she has not been able to do this. 07/26/15 no major change in the condition of either wound on her medial ankle. I did not attempt to debridement of these. She has been aggressively scrubbing these while she is in the shower at home. She has her supply of Iodoflex which seems to have done more for these wounds then anything I have put on recently. 08/09/15 wound area appears larger although not verified by measurements. Using Iodoflex 09/05/2015 -- she was here for avisit today but had significant problems with the wound and I was asked to see her for a physician opinion. I have summarize that this lady has had surgery on her left lower extremity about 10 years ago where the possible veins stripping was done. She has had an opinion from interventional radiology around November 2016 where no further sclerotherapy was ordered. The patient works 12 hours a day and stands on a concrete floor with work boots and is unable to get the proper compression she requires and cannot elevate her limbs appropriately at any given time. She has recently grown Pseudomonas from her wound culture but has not started her ciprofloxacin which was called in for her. 09/13/15 this continues to be a difficult situation for this patient. At one point I had this wound down to a 1.5 x 1.5" wound on her left leg. This is deteriorated and the right leg has reopened. She now has substantial wounds on her medial calcaneus, malleoli and into her lower leg. One on the left has surface eschar but these are far too painful for me to debridement here. She has a vascular surgery appointment next week to  see if anything can be done to help here. I think she has had previous ablations several years ago at [facility]. She has no major edema. She tells me that she did not get product last time [facility]] and went for several days without it. She continues to work in work boots 12 hours a day. She cannot get compression/4-layer under her work boots. 09/20/15 no major change. Periwound edema control was not very good. Her point with pain and vascular is next Wednesday the 25th 09/28/15; the patient is seen vascular surgery and is apparently scheduled for repeat duplex ultrasounds of her bilateral lower legs next week. 10/05/15; the patient was seen by Dr. Doren Custard of vascular surgery. He feels that she should have arterial insufficiency excluded as cause/contributed to her nonhealing stage she is therefore booked for an arteriogram. She has apparently monophasic signals in the dorsalis pedis pulses. She also of course has known severe chronic venous insufficiency with previous procedures as noted previously. I had another long discussion with the patient today about her continuing to work 12 hour shifts. I've written her out for 2 months area had concerns about this as her work location is currently undergoing significant turmoil and this may lead to  her termination. She is aware of this however I agree with her that she simply cannot continue to stand for 12 hours multiple days a week with the substantial wound areas she has. 10/19/15; the Dr. Doren Custard appointment was largely for an arteriogram which was normal. She does not have an arterial issue. He didn't make a comment about her chronic venous insufficiency for which she has had previous ablations. Presumably it was not felt that anything additional could be done. The patient is now out of work as I prescribed 2 weeks ago. Her wounds look somewhat less aggravated presumably because of this. I felt I would give debridement another try today 10/25/15; no major  change in this patient's wounds. We are struggling to get her product that she can afford into her own home through her insurance. 11/01/15; no major change in the patient's wounds. I have been using silver alginate as the most affordable product. I spoke to Dr. Marla Roe last week with her requested take her to the OR for surgical debridement and placement of ACEL. Dr. Marla Roe told me that she would be willing to do this however [facility] will not cover this, fortunately the patient has [place] healthcare of some variant 11/08/15; no major change in the patient's wounds. She has been completely nonviable surface that this but is in too much pain with any attempted debridement are clinic. I have arranged for her to see Dr. Marla Roe ham of plastic surgery and this appointment is on Monday. I am hopeful that they will take her to the OR for debridement, possible ACEL ultimately possible skin graft 11/22/15 no major change in the patient's wounds over her bilateral medial calcaneus medial malleolus into the lower legs. Surface on these does not look too bad however on the left there is surrounding erythema and tenderness. This may be cellulitis or could him sleepy tinea. 11/29/15; no major changes in the patient's wounds over her bilateral medial malleolus. There is no infection here and I don't think any additional antibiotics are necessary. There is now plan to move forward. She sees Dr. Marla Roe in a week's time for preparation for operative debridement and ACEL placement I believe on 7/12. She then has a follow-up appointment with Dr. Marla Roe on 7/21 12/28/15; the patient returns today having been taken to the [place] by Dr. Marla Roe 12/12/15 she underwent debridement, intraoperative cultures [which were negative]. She had placement of a wound VAC. Parent really ACEL was not available to be placed. The wound VAC foam apparently adhered to the wound since then she's been using silver  alginate, Xeroform under Ace wraps. She still says there is a lot of drainage and a lot of pain 01/31/16; this is a patient I see monthly. I had referred her to Dr. Marla Roe him of plastic surgery for large wounds on her bilateral medial ankles. She has been to the OR twice once in early July and once in early August. She tells me over the last 3 weeks she has been using the wound VAC with ACEL underneath it. On the right we've simply been using silver alginate. Under Kerlix Coban wraps. 02/28/16; this is a patient I'm currently seeing monthly. She is gone on to have a skin graft over her large venous insufficiency ulcer on the left medial ankle. This was done by Dr. Marla Roe him. The patient is a bit perturbed about why she didn't have one on her right medial ankle wound. She has been using silver alginate to this. 03/06/16;  I received a phone call from her plastic surgery Dr. Marla Roe. She expressed some concern about the viability of the skin graft she did on the left medial ankle wound. Asked me to place Endoform on this. She told me she is not planning to do a subsequent skin graft on the right as the left one did not take very well. I had placed Hydrofera Blue on the right 03/13/16; continue to have a reasonably healthy wound on the right medial ankle. Down to 3 mm in terms of size. There is epithelialization here. The area on the left medial ankle is her skin graft site. I suppose the last week this looks somewhat better. She has an open area inferiorly however in the center there appears to be some viable tissue. There is a lot of surface callus and eschar that will eventually need to come off however none of this looked to be infected. Patient states that the is able to keep the dressing on for several days which is an improvement. 03/20/16 no major change in the circumference of either wound however on the left side the patient was at Dr. Eusebio Friendly office and they did a debridement of  left wound. 50% of the wound seems to be epithelialized. I been using Endoform on the left Hydrofera Blue in the right 03/27/16; she arrives today with her wound is not looking as healthy as they did last week. The area on the right clearly has an adherent surface to this a very similar surface on the left. Unfortunately for this patient this is all too familiar problem. Clearly the Endoform is not working and will need to change that today that has some potential to help this surface. She does not tolerate debridement in this clinic very well. She is changing the dressing wants 04/03/16; patient arrives with the wounds looking somewhat better especially on the right. Dr. Migdalia Dk change the dressing to silver alginate when she saw her on Monday and also sold her some compression socks. The usefulness of the latter is really not clear and woman with severely draining wounds. 04/10/16; the patient is doing a bit of an experiment wearing the compression stockings that Dr. Migdalia Dk provided her to her left leg and the out of legs based dressings that we provided to the right. 05/01/16; the patient is continuing to wear compression stockings Dr. Migdalia Dk provided her on the left that are apparently silver impregnated. She has been using Iodoflex to the right leg wound. Still a moderate amount of drainage, when she leaves here the wraps only last for 4 days. She has to change the stocking on the left leg every night 05/15/16; she is now using compression stockings bilaterally provided by Dr. Marla Roe. She is wearing a nonadherent layer over the wounds so really I don't think there is anything specific being done to this now. She has some reduction on the left wound. The right is stable. I think all healing here is being done without a specific dressing 06/09/16; patient arrives here today with not much change in the wound certainly in diameter to large circular wounds over the medial aspect of her  ankle bilaterally. Under the light of these services are certainly not viable for healing. There is no evidence of surrounding infection. She is wearing compression stockings with some sort of silver impregnation as prescribed by Dr. Marla Roe. She has a follow-up with her tomorrow. 06/30/16; no major change in the size or condition of her wounds. These are still probably covered  with a nonviable surface. She is using only her purchase stockings. She did see Dr. Marla Roe who seemed to want to apply Dakin's solution to this I'm not extreme short what value this would be. I would suggest Iodoflex which she still has at home. 07/28/16; I follow Mrs. Hamza episodically along with Dr. Marla Roe. 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 She has been applying a topical collagen ointment to both wounds with Adaptic. I don't think Dr. Marla Roe is planning to take her back to the OR. 08/19/16; I follow Mrs. Jeneen Rinks on a monthly basis along with Dr. Marla Roe of plastic surgery. 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 I been following her for a number of years. At one point I was able to get the right medial malleolus wound to heal and had the left medial malleolus down to about half its current size however and I had to send her to plastic surgery for an operative debridement. Since then things have been stable to slightly improve the area on the right is slightly better one in the left about the same although there is much less adherent surface than I'm used to with this patient. She is using some form of liquid collagen gel that Dr. Marla Roe provided a Kerlix cover with the patient's own pressure stockings. She tells me that she has extreme pain in both ankles and along the lateral aspect of both feet. She has been unable to work for some period of time. She is telling  me she is retiring at the beginning of April. She sees Dr. Doran Durand of orthopedics next week 09/22/16; patient has not seen Dr. Marla Roe since the last time she is here. I'm not really sure what she is using to the wounds other than bits and pieces of think she had left over including most recently Hydrofera Blue. She is using juxtalite stockings. She is having difficulty with her husband's recent illness "stroke". She is having to transport him to various doctors appointments. Dr. Marla Roe left her the option of a repeat debridement with ACEL however she has not been able to get the time to follow-up on this. She continues to have a fair amount of drainage out of these wounds with certainly precludes leaving dressings on all week 10/13/16; patient has not seen Dr. Marla Roe since she was last in our clinic. I'm not really sure what she is doing with the wounds, we did try to get her [facility] and I think she is actually using this most of the time. Because of drainage she states she has to change this every second day although this is an improvement from what she used to do. She went to see Dr. Doran Durand who did not think she had a muscular issue with regards to her feet, he referred her to a neurologist and I think the appointment is sometime in June. I changed her back to Iodoflex which she has used in the past but not recently. 11/03/16; the patient has been using Iodoflex although she ran out of this. Still claims that there is a lot of drainage although the wound does not look like this. No surrounding erythema. She has not been back to see Dr. Marla Roe 11/24/16; the patient has been using Iodoflex again but she ran out of it 2 or 3 days ago. There is no major change in the condition of either one of these  wounds in fact they are larger and covered in a thick adherent surface slough/nonviable tissue especially on the left. She does not tolerate mechanical debridement in our clinic. Going back  to see Dr. Marla Roe of plastic surgery for an operative debridement would seem reasonable. 12/15/16; the patient has not been back to see Dr. Marla Roe. She is been dealing with a series of illnesses and her husband which of monopolized her time. She is been using Sorbact which we largely supplied. She states the drainage is bad enough that it maximum she can go 2-3 days without changing the dressing 01/12/2017 -- the patient has not been back for about 4 weeks and has not seen Dr. Marla Roe not does she have any appointment pending. 01/23/17; patient has not seen Dr. Marla Roe even though I suggested this previously. She is using Santyl that was suggested last week by Dr. Con Memos this Cost her $16 through her insurance which is indeed surprising 02/12/17; continuing Santyl and the patient is changing this daily. A lot of drainage. She has not been back to see plastic surgery she is using an Ace wrap. Our intake nurse suggested wrap around stockings which would make a good reasonable alternative 02/26/17; patient is been using Santyl and changing this daily due to drainage. She has not been to see plastic surgery she uses in April Ace wrap to control the edema. She did obtain extremitease stockings but stated that the edema in her leg was to big for these 03/20/17; patient is using Santyl and Anasept. Surfaces looked better today the area on the right is actually measuring a little smaller. She has states she has a lot of pain in her feet and ankles and is asking for a consult to pain control which I'll try to help her with through our case manager. 04/10/17; the patient arrives with better-looking wound surfaces and is slightly smaller wound on the left she is using a combination of Santyl and Anasept. She has an appointment or at least as started in the pain control center associated with [facility] 05/14/17; this is a patient who I followed for a prolonged period of time. 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 At one point I had this down to a much smaller wound on the left however these reopened and we've never been able to get these to heal. She has been using Santyl and Anasept gel although 2 weeks ago she ran out of the Anasept gel. She has a stable appearance of the wound. She is going to the wound care clinic at [facility]. They wanted do a nerve block/spinal block although she tells me she is reluctant to go forward with that. 05/21/17; this is a patient I have followed for many years. 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 Chronic pain and deformity in her ankles as well. She is been to see plastic surgery as well as orthopedics. Using PolyMem AG most recently/Kerramax/ABDs and 2 layer compression. She has managed to keep this on and she is coming in for a nurse check to change the dressing on Tuesdays, we see her on Fridays 06/05/17; really quite a good looking surface and the area especially on the right medial has contracted in terms of dimensions. Well granulated healthy-looking tissue on both sides. Even with an open curet there is nothing that even feels abnormal here. This is as good as I've seen this in quite some time. We have been using PolyMem AG and  bringing her in for a nurse check 06/12/17; really quite good surface on both of these wounds. The right medial has contracted a bit left is not. We've been using PolyMem and AG and she is coming in for a nurse visit 06/19/17; we have been using PolyMem AG and bringing her in for a nurse check. Dimensions of her wounds are not better but the surfaces looked better bilaterally. She complained of bleeding last night and the left wound and increasing pain bilaterally. She states her wound pain is more neuropathic than just the wounds. There was some suggestion that this was radicular from her pain management doctor in talking to her it is really difficult to sort  this out. 06/26/17; using PolyMem and AG and bringing her in for a nurse check as All of this and reasonably stable condition. Certainly not improved. The dimensions on the lateral part of the right leg look better but not really measuring better. The medial aspect on the left is about the same. 07/03/16; we have been using PolyMen AG and bringing her in for a nurse check to change the dressings as the wounds have drainage which precludes once weekly changing. We are using all secondary absorptive dressings.our intake nurse is brought up the idea of using a wound VAC/snap VAC on the wound to help with the drainage to see if this would result in some contraction. This is not a bad idea. The area on the right medial is actually looking smaller. Both wounds have a reasonable-looking surface. There is no evidence of cellulitis. The edema is well controlled 07/10/17; the patient was denied for a snap VAC by her insurance. The major issue with these wounds continues to be drainage. We are using wicked PolyMem AG and she is coming in for a nurse visit to change this. The wounds are stable to slightly improved. The surface looks vibrant and the area on the right certainly has shrunk in size but very slowly 07/17/17; the patient still has large wounds on her bilateral medial malleoli. Surface of both of these wounds looks better. The dimensions seem to come and go but no consistent improvement. There is no epithelialization. We do not have options for advanced treatment products due to insurance issues. They did not approve of the wound VAC to help control the drainage. More recently we've been using PolyMem and AG wicked to allow drainage through. We have been bringing her in for a nurse visit to change this. We do not have a lot of options for wound care products and the home again due to insurance issues 07/24/17; the patient's wound actually looks somewhat better today. No drainage measurements are smaller still  healthy-looking surface. We used silver collagen under PolyMen started last week. We have been bringing her in for a dressing change 07/31/17; patient's wound surface continued to look better and I think there is visible change in the dimensions of the wound on the right. Rims of epithelialization. We have been using silver collagen under PolyMen and bringing her in for a dressing change. There appears to be less drainage although she is still in need of the dressing change 08/07/17. Patient's wound surface continues to look better on both sides and the area on the right is definitely smaller. We have been using silver collagen and PolyMen. She feels that the drainage has been it has been better. I asked her about her vascular status. She went to see Dr. Aleda Grana at [facility] and had some form of  ablation. I don't have much detail on this. I haven't my notes from 2016 that she was not a candidate for any further ablation but I don't have any more information on this. We had referred her to vein and vascular I don't think she ever went. He does not have a history of PAD although I don't have any information on this either. We don't even have ABIs in our record 08/14/17; we've been using silver collagen and PolyMen cover. And putting the patient and compression. She we are bringing her in as a nurse visit to change this because ofarge amount of drainage. We didn't the ABIs in clinic today since they had been done in many moons 1.2 bilaterally. She has been to see vein and vascular however this was at [facility] and she had ablation although I really don't have any information on this all seemed biking get a report. She is also been operatively debrided by plastic surgery and had a cell placed probably 8-12 months ago. This didn't have a major effect. We've been making some gains with current dressings 08/19/17-She is here in follow-up evaluation for bilateral medial malleoli ulcers. She continues  to tolerate debridement very poorly. We will continue with recently changed topical treatment; if no significant improvement may consider switching to Iodosorb/Iodoflex. 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 She has been using silver collagen and PolyMem. I believe she has been used and tried on Iodoflex before. 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 These have been very refractory over time. We have been using silver collagen and PolyMen. She is coming in once a week for a doctor's and once a week for nurse visits. We are actually making some progress 09/18/17; the patient's wounds are smaller especially on the right medial. She arrives today to upset to consider even washing these off with Anasept which I think is been part of the reason this is been closing. We've been using collagen covered in PolyMen otherwise. It is noted that she has a small area of folliculitis on the right medial calf that. As we are wrapping her legs I'll give her a short course of doxycycline to make sure this doesn't amount to anything. She is a long list of complaints today including imbalance, shortness of breath on exertion, inversion of her left ankle. With regards to the latter complaints she is been to see orthopedics and they offered her a tendon release surgery I believe but wanted her wounds to be closed first. I have recommended she go see her primary physician with regards to everything else. 09/25/17; patient's wounds are about the same size. We have made some progress bilaterally although not in recent weeks. She will not allow me T wash these wounds with Anasept even if she is doing her cell. Wheeze we've been using collagen covered in PolyMen. Last week she had a  small area of folliculitis this is now opened into a small wound. She completed 5 days of trimethoprim sulfamethoxazole 10/02/17; unfortunately the area on her left medial ankle is worse with a larger wound area towards the Achilles. The patient complains of a lot of pain. She will not allow debridement although visually I don't think there is anything to debridement in any case. We have been using silver  collagen and PolyMen for several months now. Initially we are making some progress although I'm not really seeing that today. We will move back to [facility]. His admittedly this is a bit of a repeat however I'm hoping that his situation is different now. The patient tells me she had her leg on the left give out on her yesterday this is process some pain. 10/09/17; the patient is seen twice a week largely because of drainage issues coming out of the chronic medial bimalleolar wounds that are chronic. Last week the dimensions of the one on the left looks a little larger I changed her to [facility]. She comes in today with a history of terrible pain in the bilateral wound areas. She will not allow debridement. She will not even allow a tissue culture. There is no surrounding erythema no no evidence of cellulitis. We have been putting her Kerlix Coban man. She will not allow more aggressive compression as there was a suggestion to put her in 3 layer wraps. 10/16/17; large wounds on her bilateral medial malleoli. These are chronic. Not much change from last week. The surface looks have healthy but absolutely no epithelialization. A lot of pain little less so of drainage. She will not allow debridement or even washing these off in the vigorous fashion with Anasept. 10/23/17; large wounds on her bilateral malleoli which are chronic. Some improvement in terms of size perhaps on the right since last time I saw these. She states that after we increased the 3 layer compression there was some bleeding, when  she came in for a nurse visit she did not want 3 layer compression put back on about our nurse managed to convince her. She has known chronic venous visit issues and I'm hoping to get her to tolerate the 3 layer compression. using Hydrofera Blue 10/30/17; absolutely no change in the condition of either wound although we've had some improvement in dimensions on the right.. Attempted to put her in 3 layer compression she didn't tolerated she is back in 2 layer compression. We've been using Hydrofera Blue We looked over her past records. She had venous reflux studies in November 2016. There was no evidence of deep venous reflux on the right. Superficial vein did not show the greater saphenous vein at think this is been previously ablated the small saphenous vein was within normal limits. The left deep venous system showed no DVT the vessels were positive for deep venous reflux in the posterior tibial veins at the ankle. The greater saphenous vein was surgically absent small saphenous vein was within normal limits. She went to vein and vascular at [facility]. I believe she had an ablation on the left greater saphenous vein. I'll update her reflux studies perhaps ever reviewed by vein and vascular. We've made absolutely no progress in these wounds. Will also try to read and TheraSkins through her insurance 11/06/17; W the patient apparently has a 2 week follow-up with vein and vascular I like him to review the whole issue with regards to her previous vascular workup by Dr. Aleda Grana. We've really made no progress on these wounds in many months. She arrives today with less viable looking surface on the left medial ankle wound. This was apparently looking about the same on Tuesday when she was here for nurse visit. 11/13/17; deep tissue culture I did last time of the left lower leg showed multiple organisms without any predominating. In particular no Staphylococcus or group A strep were isolated. We sent  her for venous reflux studies. She's had a previous left greater saphenous vein stripping and I think sclerotherapy of the right greater saphenous vein. She didn't really look at the lesser saphenous vein this both wounds are on the medial aspect. She has reflux in the common femoral vein and popliteal vein and an accessory vein on the right and the common femoral vein and popliteal vein on the left. I'm going to have her go to see vein and vascular just the look over things and see if anything else beside aggressive compression is indicated here. We have not been able to make any progress on these wounds in spite of the fact that the surface of the wounds is never look too bad. 11/20/17; no major change in the condition of the wounds. Patient reports a large amount of drainage. She has a lot of complaints of pain although enlisting her today I wonder if some of this at least his neuropathic rather than secondary to her wounds. She has an appointment with vein and vascular on 12/30/17. The refractory nature of these wounds in my mind at least need vein and vascular to look over the wounds the recent reflux studies we did and her history to see if anything further can be done here. I also note her gait is deteriorated quite a bit. 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 She has a bilateral Trendelenburg gait. I wonder if this is neuropathic or perhaps multilevel radicular. 11/27/17; her wounds actually looks slightly better. Healthy-looking granulation tissue a scant amount of epithelialization. [place] healthcare will not pay for [employer]. They will play for tri layer Oasis and Dermagraft. This is not a diabetic ulcer. We'll try for the tri layer Oasis. She still complains of some drainage. She has a vein and vascular appointment on 12/30/17 12/04/17; the wounds visually look quite good. Healthy-looking granulation with some degree of epithelialization. We are still waiting for response to our  request for trial to try layer Oasis. Her appointment with vascular to review venous and arterial issues isn't sold the end of July 7/31. Not allow debridement or even vigorous cleansing of the wound surface. 12/18/17; slightly smaller especially on the right. 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 We've been using Hydrofera Blue. We still are looking into triple layer Oasis through her insurance 01/08/18 on evaluation today patient's wound actually appears to be showing signs of good improvement at this point in time. She has been tolerating the dressing changes without complication. Fortunately there does not appear to be any evidence of infection at this point in time. We have been utilizing silver nitrate which does seem to be of benefit for her which is also good news. Overall I'm very happy with how things seem to be both regards appearance as well as measurement. Patient did see Dr. Bridgett Larsson for evaluation on 12/30/17. In his assessment he felt that stripping would not likely add much more than chronic compression to the patient's healing process. His recommendation was to follow-up in three months with Dr. Doren Custard if she hasn't healed in order to consider referral back to you and see vascular where she previously was in a trial and was able to get her wound to heal. I'll be see what she feels she when you staying compression and he reiterated this as well. 01/13/18 on evaluation today patient appears to actually be doing very well in regard to her bilateral medial malleolus ulcers. She seems to have tolerated the  chemical cauterization with silver nitrate last week she did have some pain through that evening but fortunately states that I'll be see since it seems to be doing better she is overall pleased with the progress. 01/21/18; really quite a remarkable improvement since I've last seen these wounds. We started using silver nitrate specially on the islands of hyper  granulation which for some reason her around the wound circumference. This is really done quite nicely. Primary dressing Hydrofera Blue under 4 layer compression. She seems to be able to hold out without a nurse rewrap. Follow-up in 1 week 01/28/18; we've continued the hydrofera blue but continued with chemical cauterization to the wound area that we started about a month ago for irregular hyper granulation. She is made almost stunning improvement in the overall wound dimensions. I was not really expecting this degree of improvement in these chronic wounds 02/05/18; we continue with Hydrofera Bluebut of also continued the aggressive chemical cauterization with silver nitrate. We made nice progress with the right greater than left wound. 02/12/18. We continued with Hydrofera Blue after aggressive chemical cauterization with silver nitrate. We appear to be making nice progress with both wound areas 02/19/2018; we continue with [facility] after washing the wounds vigorously with Anasept spray and chemical cauterization with silver nitrate. We are making excellent progress. The area on the right's just about closed 02/26/2018. The area on the left medial ankle had too much necrotic debris today. I used a #5 curette we are able to get most of the soft. I continued with the silver nitrate to the much smaller wound on the right medial ankle she had a new area on her right lower pretibial area which she says was due to a role in her compression 03/05/2018; both wound areas look healthy. Not much change in dimensions from last week. I continue to use silver nitrate and Hydrofera Blue. The patient saw Dr. Doren Custard of vein and vascular. He felt she had venous stasis ulcers. He felt based on her previous arteriogram she should have adequate circulation for healing. Also she has deep venous reflux but really no significant correctable superficial venous reflux at this time. He felt we should continue with  conservative management including leg elevation and compression 04/02/2018; since we last saw this woman about a month ago she had a fall apparently suffered a pelvic fracture. I did not look up the x-ray. Nevertheless because of pain she literally was bedbound for 2 weeks and had home health coming out to change the dressing. Somewhat predictably this is resulted in considerable improvement in both wound areas. The right is just about closed on the medial malleolus and the left is about half the size. 04/16/2018; both her wounds continue to go down in size. Using Hydrofera Blue. 05/07/18; both her wounds appeared to be improving especially on the right where it is almost closed. We are using Hydrofera Blue 05/14/2018; slightly worse this week with larger wounds. Surface on the left medial not quite as good. We have been using Hydrofera Blue 05/21/18; again the wounds are slightly larger. Left medial malleolus slightly larger with eschar around the circumference. We have been using Hydrofera Blue undergoing a wraps for a prolonged period of time. This got a lot better when she was more recumbent due to a fall and a back injury. I change the primary dressing the silver alginate today. She did not tolerate a 4 layer compression previously although I may need to bring this up with her next time  05/28/2018; area on the left medial malleolus again is slightly larger with more drainage. Area on the right is roughly unchanged. She has a small area of folliculitis on the right medial just on the lower calf. This does not look ominous. 06/03/2018 left medial malleolus slightly smaller in a better looking surface. We used silver nitrate on this last time with silver alginate. The area on the right appears slightly smaller 1/10; left medial malleolus slightly smaller. Small open area on the right. We used silver nitrate and silver alginate as of 2 weeks ago. We continue with the wound and compression. These got a  lot better when she was off her feet 1/17; right medial malleolus wound is smaller. The left may be slightly smaller. Both surfaces look somewhat better. 1/24; both wounds are slightly smaller. Using silver alginate under Unna boots 1/31; both wounds appear smaller in fact the area on the right medial is just about closed. Surface eschar. We have been using silver alginate under Unna boots. The patient is less active now spends let much less time on her feet and I think this is contributed to the general improvement in the wound condition 2/7; both wounds appear smaller. I was hopeful the right medial would be closed however there there is still the same small open area. Slight amount of surface eschar on the left the dimensions are smaller there is eschar but the wound edges appear to be free. We have been using silver alginate under Unna boot's 2/14; both wounds once again measure smaller. Circumferential eschar on the left medial. We have been using silver alginate under Unna boots with gradual improvement 2/21; the area on the right medial malleolus has healed. The area on the left is smaller. We have been using silver alginate and Unna boots. We can discharge wrapping the right leg she has 20/30 stockings at home she will need to protect the scar tissue in this area 2/28; the area on the right medial malleolus remains closed the patient has a compression stocking. The area on the left is smaller. We have been using silver alginate and Unna boots. 3/6 the area on the right medial ankle remains closed. Good edema control noted she is using her own compression stocking. The area on the left medial ankle is smaller. We have been managing this with silver alginate and Unna boots which we will continue today. 3/13; the area on the right medial ankle remains closed and I'm declaring it healed today. When necessary the left is about the same still a healthy-looking surface but no major change and wound  area. No evidence of infection and using silver alginate under unna and generally making considerable improvement 3/27 the area on the right medial ankle remains closed the area on the left is about the same as last week. Certainly not any worse we have been using silver alginate under an Unna boot 4/3; the area on the right medial ankle remains closed per the patient. We did not look at this wound. The wound on the left medial ankle is about the same surface looks healthy we have been using silver alginate under an Unna boot 4/10; area on the right medial ankle remains closed per the patient. We did not look at this wound. The wound on the left medial ankle is slightly larger. The patient complains that the [facility] caused burning pain all week. She also told us that she was a lot more active this week. Changed her back to silver  alginate 4/17; right medial ankle still closed per the patient. Left medial ankle is slightly larger. Using silver alginate. She did not tolerate Hydrofera Blue on this area 4/24; right medial ankle remains closed we have not look at this. The left medial ankle continues to get larger today by about a centimeter. We have been using silver alginate under Unna boots. She complains about 4 layer compression as an alternative. She has been up on her feet working on her garden 5/8; right medial ankle remains closed we did not look at this. The left medial ankle has increased in size about 100%. We have been using silver alginate under Unna boots. She noted increased pain this week and was not surprised that the wound is deteriorated 5/15; no major change in SA however much less erythema ( one week of doxy ocellulitis). 5/22-64 year old female returns at 1 week to the clinic for left medial ankle wound for which we have been using silver alginate under 3 layer compression She was placed on DOXY at last visit - the wound is wider at this visit. She is in 3 layer  compression 5/29; change to [facility] last week. I had given her empiric doxycycline 2 weeks ago for a week. She is in 3 layer compression. She complains of a lot of pain and drainage on presentation today. 6/5; using Hydrofera Blue. I gave her doxycycline recently empirically for erythema and pain around the wound. Believe her cultures showed enterococcus which not would not have been well covered by doxycycline nevertheless the wound looks better and I don't feel specifically that the enterococcus needs to be covered. She has a new what looks like a wrap injury on her lateral left ankle. 6/12; she is using Hydrofera Blue. She has a new area on the left anterior lower tibial area. This was a wrap injury last week. 6/19; the patient is using Hydrofera Blue. She arrived with marked inflammation and erythema around the wound and tenderness. 12/01/18 on evaluation today patient appears to be doing a little bit better based on what I'm hearing from the standpoint of lassos evaluation to this as far as the overall appearance of the wound is concerned. Then sometime substandard she typically sees Dr. Dellia Nims. Nonetheless overall very pleased with the progress that she's made up to this point. No fevers, chills, nausea, or vomiting noted at this time. 7/10; some improvement in the surface area. Aggressively debrided last week apparently. I went ahead with the debridement today although the patient does not tolerate this very well. We have been using Iodoflex. Still a fair amount of drainage 7/17; slightly smaller. Using Iodoflex. 7/24; no change from last week in terms of surface area. We have been using Iodoflex. Surface looks and continues to look somewhat better 7/31; surface area slightly smaller better looking surface. We have been using Iodoflex. This is under Unna boot compression 8/7-Patient presents at 1 week with Unna boot and Iodoflex, wound appears better 8/14-Patient presents at 1 week with  Iodoflex, we use the Unna boot, wound appears to be stable better.Patient is getting Botox treatment for the inversion of the foot for tendon release, Next week 8/21; we are using Iodoflex. Unna boot. The wound is stable in terms of surface area. Under illumination there is some areas of the wound that appear to be either epithelialized or perhaps this is adherent slough at this point I was not really clear. It did not wipe off and I was reluctant to debride this today. 8/28;  we are using Iodoflex in an [employer]. Seems to be making good improvement. 9/4; using Iodoflex and wound is slightly smaller. 9/18; we are using Iodoflex with topical silver nitrate when she is here. The wound continues to be smaller 10/2; patient missed her appointment last week due to GI issues. She left and Iodoflex based dressing on for 2 weeks. Wound is about the same size about the size of a dime on the left medial lower 10/9 we have been using Iodoflex on the medial left ankle wound. She has a new superficial probable wrap injury on the dorsal left ankle 10/16; we have been using Hydrofera Blue since last week. This is on the left medial ankle 10/23; we have been using Hydrofera Blue since 2 weeks ago. This is on the left medial ankle. Dimensions are better 11/6; using Hydrofera Blue. I think the wound is smaller but still not closed. Left medial ankle 11/13; we have been using Hydrofera Blue. Wound is certainly no smaller this week. Also the surface not as good. This is the remanent of a very large area on her left medial ankle. 11/20; using Sorbact since last week. Wound was about the same in terms of size although I was disappointed about the surface debris 12/11; 3-week follow-up. Patient was on vacation. Wound is measuring slightly larger we have been using Sorbact. 12/18; wound is about the same size however surface looks better last week after debridement. We have been using Sorbact under compression 1/15 wound  is probably twice the size of last time increased in length nonviable surface. We have been using Sorbact. She was running a mild fever and missed her appointment last week 1/22; the wound is come down in size but under illumination still a very adherent debris we have been Hydrofera Blue that I changed her to last week 1/29; dimensions down slightly. We have been using Hydrofera Blue 2/19 dimensions are the same however there is rims of epithelialization under illumination. Therefore more the surface area may be epithelialized 2/26; the patient's wound actually measures smaller. The wound looks healthy. We have been using Hydrofera Blue. I had some thoughts about running Apligraf then I still may do that however this looks so much better this week we will delay that for now 3/5; the wound is small but about the same as last week. We have been using Hydrofera Blue. No debridement is required today. 3/19; the wound is about the size of a dime. Healthy looking wound even under illumination. We have been using Hydrofera Blue. No mechanical debridement is necessary 3/26; not much change from last week although still looks very healthy. We have been using Hydrofera Blue under Unna boots Patient was offered an ankle fusion by podiatry but not until the wound heals with a proceed with this. 4/9; the patient comes in today with her original wound on the medial ankle looking satisfactory however she has some uncontrolled swelling in the middle part of her leg with 2 new open areas superiorly just lateral to the tibia. I think this was probably a wrap issue. She said she felt uncomfortable during the week but did not call in. We have been using Hydrofera Blue 4/16; the wound on the medial ankle is about the same. She has innumerable small areas superior to this across her mid tibia. I think this is probably folliculitis. She is also been working in the yard doing a lot of sweating 4/30; the patient issue on  the upper areas across  her mid tibia of all healed. I think this was excessive yard work if I remember. Her wound on the medial ankle is smaller. Some debris on this we have been using Hydrofera Blue under Unna boots 5/7; mid tibia. She has been using Hydrofera Blue under an Unna wrap. She is apparently going for her ankle surgery on June 3 10/28/19-Patient returns to clinic with the ankle wound, we are using Hydrofera Blue under Unna wrap, surgery is scheduled for her left foot for June 3 so she will be back for nurse visit next week READMISSION 01/17/2020 Mrs. Ingle is a 64 year old woman we have had in this clinic for a long period of time with severe venous hypertension and refractory wounds on her medial lower legs and ankles bilaterally. 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 When she was off her legs for a prolonged period example when she fell and suffered a compression fracture things would heal up quite nicely. She is now retired and we managed to heal up the right medial leg wound. The left one was very tiny last time I saw this although still refractory. 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 It got to the point where this was interfering with her walking and she elected to proceed with a ankle arthrodesis to straighten her her ankle and leave her with a functional outcome for mobilization. The patient was referred to Dr. Doren Custard and really this took some time to arrange. Dr. Doren Custard saw her on 12/07/2019. Once again he verified that she had no arterial issues. She had previously had an angiogram several years ago. Follow-up ABIs on the left showed an ABI of 1.12 with triphasic waveforms and a TBI of 0.92. She is felt to have chronic deep venous insufficiency but I do not think  it was felt that anything could be done from about this from an ablation point of view. At the time Dr. Doren Custard saw this patient the wounds actually look closed via the pictures in his clinic. The patient finally underwent her surgery on 12/15/2019. This went reasonably well and there was a good anatomic outcome. She developed a small distal wound dehiscence on the lateral part of the surgical wound. However more problematically she is developed recurrence of the wound on the medial left ankle. There are actually 2 wounds here one in the distal lower leg and 1 pretty much at the level of the medial malleolus. It is a more distal area that is more problematic. She has been using Hydrofera Blue which started on Friday before this she was simply Ace wrapping. There was a culture done that showed Pseudomonas and she is on ciprofloxacin. A recent CNS on 8/11 was negative. The patient reports some pain but I generally think this is improving. She is using a cam boot completely nonweightbearing using a walker for pivot transfers and a wheelchair 8/24; not much improvement unfortunately she has a surgical wound on the lateral part in the venous insufficiency wound medially. The bottom part of the medial insufficiency wound is still necrotic there is exposed tendon here. We have been using Hydrofera Blue under compression. Her edema control is however better 8/31; patient in for follow-up of his surgical wound on the lateral part of her left leg and chronic venous  insufficiency ulcers medially. We put her back in compression last week. She comes in today with a complaint of 3 or 4 days worth of increasing pain. She felt her cam walker was rubbing on the area on the back of her heel. However there is intense erythema seems more likely she has cellulitis. She had 2 cultures done when she was seeing podiatry in the postop. One of them in late July showed Pseudomonas and she received a course of ciprofloxacin the  other was negative on 8/11 she is allergic to penicillin with anaphylactoid complaints of hives oral swelling via information in epic 9/9; when I saw this patient last week she had intense anterior erythema around her wound on the right lateral heel and ankle and also into the right medial heel. Some of this was no doubt drainage and her walker boot however I was convinced she had cellulitis. I gave her Levaquin and Bactrim she is finishing up on this now. She is following up with Dr. [name] he saw her yesterday. He is taken her out of the walking boot of course she is still nonweightbearing. Her x-ray was negative for any worrisome features such as soft tissue air etc. Things are a lot better this week. She has home health. We have been using Hydrofera Blue under an [employer] which she put back on yesterday. I did not wrap her last week 9/17; her surrounding skin looks a lot better. In fact the area on the left lateral ankle has just a scant amount of eschar. The only remaining wound is the large area on the left medial ankle. Probably about 60% of this is healthy granulation at the surface however she has a significant divot distally. This has adherent debris in it. I been using debridement and silver collagen to try and get this area to fill-in although I do not think we have made much progress this week 9/24; the patient's wound on the left medial ankle looks a lot better. The deeper divot area distally still requires debridement but this is cleaning up quite nicely we have been using silver collagen. The patient is complaining of swelling in her foot and is worried that that is contributing to the nonhealing of the ankle wound. She is also complaining of numbness in her anterior toes 10/4; left medial ankle. The small area distally still has a divot with necrotic material that I have been debriding away. This has an undermining area. She is approved for Apligraf. She saw Dr. [name] her surgeon on  10/1. I think he declared himself is satisfied with the condition of things. Still nonweightbearing till the end of the month. 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 Her surgical wound is well closed. There is no evidence of infection 10/11; the patient arrived in clinic today with the expectation that we be able to put an Apligraf on this area after debridement however she arrives with a relatively large amount of green drainage on the dressing. The patient states that this started on Friday. She has not been systemically unwell. Electronic Signature(s) Signed: 03/12/2020 5:03:49 PM By: Linton Ham MD Entered By: Linton Ham on 03/12/2020 10:44:27 -------------------------------------------------------------------------------- Physical Exam Details Patient Name: Date of Service: [name], [name] 03/12/2020 10:00 A M Medical Record Number: [id number] Patient Account Number: [id number] Date of Birth/Sex: Treating RN: 06-Sep-1955 (64 y.o. [name] Primary Care Provider: [name] Other Clinician: Referring Provider: Treating Provider/Extender: Freddi Che  Weeks in Treatment: 7 Constitutional Sitting or standing Blood Pressure is within target range for patient.. Pulse regular and within target range for patient.Marland Kitchen Respirations regular, non-labored and within target range.. Temperature is normal and within the target range for the patient.Marland Kitchen Appears in no distress. Notes Wound exam; left medial ankle. On the most distal part of this wound there is a divot we have noted to this before. I suspect this is where the drainage is coming from. I used a #5 curette to debride necrotic material from this wound and then culture the area. She is tender underneath this but there is no surrounding erythema. No palpable bone Electronic Signature(s) Signed: 03/12/2020 5:03:49 PM By: Linton Ham MD Entered By: Linton Ham on  03/12/2020 10:46:01 -------------------------------------------------------------------------------- Physician Orders Details Patient Name: Date of Service: [name], [name] 03/12/2020 10:00 A M Medical Record Number: [id number] Patient Account Number: [id number] Date of Birth/Sex: Treating RN: 1955/11/24 (64 y.o. [name] Primary Care Provider: [name] Other Clinician: Referring Provider: Treating Provider/Extender: [name] in Treatment: 7 Verbal / Phone Orders: No Diagnosis Coding ICD-10 Coding Code Description [phone] Chronic venous hypertension (idiopathic) with ulcer and inflammation of left lower extremity L97.828 Non-pressure chronic ulcer of other part of left lower leg with other specified severity L97.328 Non-pressure chronic ulcer of left ankle with other specified severity Follow-up Appointments Return Appointment in 1 week. Dressing Change Frequency Wound #15 Left,Medial Malleolus Change dressing three times week. - wound clinic to change on Mondays, home health to change on Wednesday and Friday Skin Barriers/Peri-Wound Care Moisturizing lotion - to leg Wound Cleansing May shower with protection. - use cast protector Primary Wound Dressing Wound #15 Left,Medial Malleolus lginate with Silver - thin layer of Gentamicin on wound bed under alginate Calcium A Secondary Dressing Wound #15 Left,Medial Malleolus Dry Gauze ABD pad Edema Control Unna Boot to Left Lower Extremity Avoid standing for long periods of time Elevate legs to the level of the heart or above for 30 minutes daily and/or when sitting, a frequency of: - throughout the day Off-Loading Other: - ensure no pressure to wound areas. [facility] for wound care. - [facility] to change twice a week Laboratory naerobe culture (MICRO) - left medial malleolus - (ICD10 L97.328 - Non-pressure chronic ulcer Bacteria  identified in Unspecified specimen by A of left ankle with other specified severity) LOINC Code: 329-9 Convenience Name: Anerobic culture Electronic Signature(s) Signed: 03/12/2020 5:03:49 PM By: Linton Ham MD Signed: 03/12/2020 5:10:40 PM By: Levan Hurst RN, BSN Previous Signature: 03/12/2020 10:41:14 AM Version By: Linton Ham MD Entered By: Levan Hurst on 03/12/2020 10:41:36 -------------------------------------------------------------------------------- Problem List Details Patient Name: Date of Service: [name], [name] 03/12/2020 10:00 A M Medical Record Number: [id number] Patient Account Number: [id number] Date of Birth/Sex: Treating RN: [birth year] (64 y.o. 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 03/12/2020 10:00 A M Medical Record Number: [id number] Patient Account Number:  [id number] Date of Birth/Sex: Treating RN: 1956-02-20 (64 y.o. [name] Primary Care Provider: [name] Other Clinician: Referring Provider: Treating Provider/Extender: [name] in Treatment: 7 Subjective History of Present Illness (HPI) the remaining wound is over the left medial ankle. Similar wound over the right medial ankle healed largely with use of Apligraf. Most recently we have been using Hydrofera Blue over this wound with considerable improvement. The patient has been extensively worked up in the past for her venous insufficiency and she is not a candidate for antireflux surgery although I have none of the details available currently. 08/24/14; considerable improvement today. About 50% of this wound areas now epithelialized. The base of the wound appears to be healthier granulation.as opposed to last week when she had deteriorated a considerable improvement 08/17/14; unfortunately the wound has regressed somewhat. The areas of epithelialization from the superior aspect are not nearly as healthy as they were last week. The patient thinks her Hydrofera Blue slipped. 09/07/14; unfortunately the area has markedly regressed in the 2 weeks since I've seen this. There is an odor surrounding erythema. The healthy granulation tissue that we had at the base of the wound now is a dusky color. The nurse reports green drainage 09/14/14; the area looks somewhat better than last week. There is less erythema and less drainage. The culture I did did not show any growth. Nevertheless I think it is better to continue the Cipro and doxycycline for a further week. The remaining wound area was debridement. 09/21/14. Wound did not require debridement last week. Still less erythema and less drainage. She can complete her antibiotics. The areas of epithelialization in the superior aspect of the wound do not look as healthy as they did some weeks ago 10/05/14 continued  improvement in the condition of this wound. There is advancing epithelialization. Less aggressive debridement required 10/19/14 continued improvement in the condition and volume of this wound. Less aggressive debridement to the inferior part of this to remove surface slough and fibrinous eschar 11/02/14 no debridement is required. The surface granulation appears healthy although some of her islands of epithelialization seem to have regressed. No evidence of infection 11/16/14; lites surface debridement done of surface eschar. The wound does not look to be unhealthy. No evidence of infection. Unfortunately the patient has had podiatry issues in the right foot and for some reason has redeveloped small surface ulcerations in the medial right ankle. Her original presentation involved wounds in this area 11/23/14 no debridement. The area on the right ankle has enlarged. The left ankle wound appears stable in terms of the surface although there is periwound inflammation. There has been regression in the amount of new skin 11/30/14 no debridement. Both wound areas appear healthy. There was no evidence of infection. The the new area on the right medial ankle has enlarged although that both the surfaces appear to be stable. 12/07/14; Debridement of the right medial ankle wound. No no debridement was done on the left. 12/14/14 no major change in and now bilateral medial ankle wounds. Both of these are very painful but the no overt evidence of infection. She has had previous venous ablation 12/21/14; patient states that her right  medial ankle wound is considerably more painful last week than usual. Her left is also somewhat painful. She could not tolerate debridement. The right medial ankle wound has fibrinous surface eschar 12/28/14 this is a patient with severe bilateral venous insufficiency ulcers. For a considerable period of time we actually had the one on the right medial ankle healed however this recently opened  up again in June. The left medial ankle wound has been a refractory area with some absent flows. We had some success with Hydrofera Blue on this area and it literally closed by 50% however it is recently opened up Foley. Both of these were debridement today of surface eschar. She tolerates this poorly 01/25/15: No change in the status of this. Thick adherent escar. Very poor tolerance of any attempt at debridement. I had healed the right medial malleolus wound for a considerable amount of time and had the left one down to about 50% of the volume although this is totally regressed over the last 48 weeks. Further the right leg has reopened. she is trying to make a appointment with pain and vascular, previous ablations with Dr. Aleda Grana. I do not believe there is an arterial insufficiency issue here 02/01/15 the status of the adherent eschar bilaterally is actually improved. No debridement was done. She did not manage to get vascular studies done 02/08/15 continued debridement of the area was done today. The slough is less adherent and comes off with less pressure. There is no surrounding infection peripheral pulses are intact 02/15/15 selective debridement with a disposable curette. Again the slough is less adherent and comes off with less difficulty. No surrounding infection peripheral pulses are intact. 02/22/15 selective debridement of the right medial ankle wound. Slough comes off with less difficulty. No obvious surrounding infection peripheral pulses are intact I did not debridement the one on the left. Both of these are stable to improved 03/01/15 selective debridement of both wound areas using a curette to. Adherent slough cup soft with less difficulty. No obvious surrounding infection. The patient tells me that 2 days ago she noted a rash above the right leg wrap. She did not have this on her lower legs when she change this over she arrives with widespread left greater than right almost  folliculitis-looking rash which is extremely pruritic. I don't see anything to culture here. There is no rash on the rest of her body. She feels well systemically. 03/08/15; selective debridement of both wounds using a curette. Base of this does not look unhealthy. She had limegreen drainage coming out of the left leg wound and describes a lot of drainage. The rash on her left leg looks improved to. No cultures were done. 03/22/15; patient was not here last week. Basal wounds does not look healthy and there is no surrounding erythema. No drainage. There is still a rash on the left leg that almost looks vasculitic however it is clearly limited to the top of where the wrap would be. 04/05/15; on the right required a surgical debridement of surface eschar and necrotic subcutaneous tissue. I did not debridement the area on the left. These continue to be large open wounds that are not changing that much. We were successful at one point in healing the area on the right, and at the same time the area on the left was roughly half the size of current measurements. I think a lot of the deterioration has to do with the prolonged time the patient is on her feet at work 04/19/15 I  attempted-like surface debridement bilaterally she does not tolerate this. She tells me that she was in allergic care yesterday with extreme pain over her left lateral malleolus/ankle and was told that she has an "sprain" 05/03/15; large bilateral venous insufficiency wounds over the medial malleolus/medial aspect of her ankles. She complains of copious amounts of drainage and his usual large amounts of pain. There is some increasing erythema around the wound on the right extending into the medial aspect of her foot to. historically she came in with these wounds the right one healed and the left one came down to roughly half its current size however the right one is reopened and the left is expanded. 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 05/10/15 large bilateral venous insufficiency wounds. There is less adherence surface left however the surface culture that I did last week grew pseudomonas therefore bilateral selective debridement score necessary. There is surrounding erythema. The patient describes severe bilateral drainage and a lot of pain in the left ankle. Apparently her podiatrist was were ready to do a cortisone shot 05/17/15; the patient complains of pain and again copious amounts of drainage. 05/24/15; we used Iodo flex last week. Patient notes considerable improvement in wound drainage. Only needed to change this once. 05/31/15; we continued Iodoflex; the base of these large wounds bilaterally is not too bad but there is probably likely a significant bioburden here. I would like to debridement just doesn't tolerate it. 06/06/14 I would like to continue the Iodoflex although she still hasn't managed to obtain supplies. She has bilateral medial malleoli or large wounds which are mostly superficial. Both of them are covered circumferentially with some nonviable fibrinous slough although she tolerates debridement very poorly. She apparently has an appointment for an ablation on the right leg by interventional radiology. 06/14/15; the patient arrives with the wounds and static condition. We attempted a debridement although she does not do well with this secondary to pain. I 07/05/15; wounds are not much smaller however there appears to be a cleaner granulating base. The left has tight fibrinous slough greater than the right. Debridement is tolerated poorly due to pain. Iodoflex is done more for these wounds in any of the multitude of different dressings I have tried on the left 1 and then subsequently the right. 07/12/15; no change in the condition of this wound. I am able to do an aggressive debridement on the right but not the left. She simply cannot tolerate it. We have been  using Iodoflex which helps somewhat. It is worthwhile remembering that at one point we healed the right medial ankle wound and the left was about 25% of the current circumference. We have suggested returning to vascular surgery for review of possible further ablations for one reason or another she has not been able to do this. 07/26/15 no major change in the condition of either wound on her medial ankle. I did not attempt to debridement of these. She has been aggressively scrubbing these while she is in the shower at home. She has her supply of Iodoflex which seems to have done more for these wounds then anything I have put on recently. 08/09/15 wound area appears larger although not verified by measurements. Using Iodoflex 09/05/2015 -- she was here for avisit today but had significant problems with the wound and I was asked to see her for a physician opinion. I have summarize that this lady has  had surgery on her left lower extremity about 10 years ago where the possible veins stripping was done. She has had an opinion from interventional radiology around November 2016 where no further sclerotherapy was ordered. The patient works 12 hours a day and stands on a concrete floor with work boots and is unable to get the proper compression she requires and cannot elevate her limbs appropriately at any given time. She has recently grown Pseudomonas from her wound culture but has not started her ciprofloxacin which was called in for her. 09/13/15 this continues to be a difficult situation for this patient. At one point I had this wound down to a 1.5 x 1.5" wound on her left leg. This is deteriorated and the right leg has reopened. She now has substantial wounds on her medial calcaneus, malleoli and into her lower leg. One on the left has surface eschar but these are far too painful for me to debridement here. She has a vascular surgery appointment next week to see if anything can be done to help here. I  think she has had previous ablations several years ago at [facility]. She has no major edema. She tells me that she did not get product last time [facility] Ag] and went for several days without it. She continues to work in work boots 12 hours a day. She cannot get compression/4-layer under her work boots. 09/20/15 no major change. Periwound edema control was not very good. Her point with pain and vascular is next Wednesday the 25th 09/28/15; the patient is seen vascular surgery and is apparently scheduled for repeat duplex ultrasounds of her bilateral lower legs next week. 10/05/15; the patient was seen by Dr. Doren Custard of vascular surgery. He feels that she should have arterial insufficiency excluded as cause/contributed to her nonhealing stage she is therefore booked for an arteriogram. She has apparently monophasic signals in the dorsalis pedis pulses. She also of course has known severe chronic venous insufficiency with previous procedures as noted previously. I had another long discussion with the patient today about her continuing to work 12 hour shifts. I've written her out for 2 months area had concerns about this as her work location is currently undergoing significant turmoil and this may lead to her termination. She is aware of this however I agree with her that she simply cannot continue to stand for 12 hours multiple days a week with the substantial wound areas she has. 10/19/15; the Dr. Doren Custard appointment was largely for an arteriogram which was normal. She does not have an arterial issue. He didn't make a comment about her chronic venous insufficiency for which she has had previous ablations. Presumably it was not felt that anything additional could be done. The patient is now out of work as I prescribed 2 weeks ago. Her wounds look somewhat less aggravated presumably because of this. I felt I would give debridement another try today 10/25/15; no major change in this patient's wounds. We are  struggling to get her product that she can afford into her own home through her insurance. 11/01/15; no major change in the patient's wounds. I have been using silver alginate as the most affordable product. I spoke to Dr. Marla Roe last week with her requested take her to the OR for surgical debridement and placement of ACEL. Dr. Marla Roe told me that she would be willing to do this however [facility] will not cover this, fortunately the patient has [place] healthcare of some variant 11/08/15; no major change  in the patient's wounds. She has been completely nonviable surface that this but is in too much pain with any attempted debridement are clinic. I have arranged for her to see Dr. Marla Roe ham of plastic surgery and this appointment is on Monday. I am hopeful that they will take her to the OR for debridement, possible ACEL ultimately possible skin graft 11/22/15 no major change in the patient's wounds over her bilateral medial calcaneus medial malleolus into the lower legs. Surface on these does not look too bad however on the left there is surrounding erythema and tenderness. This may be cellulitis or could him sleepy tinea. 11/29/15; no major changes in the patient's wounds over her bilateral medial malleolus. There is no infection here and I don't think any additional antibiotics are necessary. There is now plan to move forward. She sees Dr. Marla Roe in a week's time for preparation for operative debridement and ACEL placement I believe on 7/12. She then has a follow-up appointment with Dr. Marla Roe on 7/21 12/28/15; the patient returns today having been taken to the [facility] by Dr. Marla Roe 12/12/15 she underwent debridement, intraoperative cultures [which were negative]. She had placement of a wound VAC. Parent really ACEL was not available to be placed. The wound VAC foam apparently adhered to the wound since then she's been using silver alginate, Xeroform under Ace wraps. She still  says there is a lot of drainage and a lot of pain 01/31/16; this is a patient I see monthly. I had referred her to Dr. Marla Roe him of plastic surgery for large wounds on her bilateral medial ankles. She has been to the OR twice once in early July and once in early August. She tells me over the last 3 weeks she has been using the wound VAC with ACEL underneath it. On the right we've simply been using silver alginate. Under Kerlix Coban wraps. 02/28/16; this is a patient I'm currently seeing monthly. She is gone on to have a skin graft over her large venous insufficiency ulcer on the left medial ankle. This was done by Dr. Marla Roe him. The patient is a bit perturbed about why she didn't have one on her right medial ankle wound. She has been using silver alginate to this. 03/06/16; I received a phone call from her plastic surgery Dr. Marla Roe. She expressed some concern about the viability of the skin graft she did on the left medial ankle wound. Asked me to place Endoform on this. She told me she is not planning to do a subsequent skin graft on the right as the left one did not take very well. I had placed Hydrofera Blue on the right 03/13/16; continue to have a reasonably healthy wound on the right medial ankle. Down to 3 mm in terms of size. There is epithelialization here. The area on the left medial ankle is her skin graft site. I suppose the last week this looks somewhat better. She has an open area inferiorly however in the center there appears to be some viable tissue. There is a lot of surface callus and eschar that will eventually need to come off however none of this looked to be infected. Patient states that the is able to keep the dressing on for several days which is an improvement. 03/20/16 no major change in the circumference of either wound however on the left side the patient was at Dr. Eusebio Friendly office and they did a debridement of left wound. 50% of the wound seems to be  epithelialized. I  been using Endoform on the left Hydrofera Blue in the right 03/27/16; she arrives today with her wound is not looking as healthy as they did last week. The area on the right clearly has an adherent surface to this a very similar surface on the left. Unfortunately for this patient this is all too familiar problem. Clearly the Endoform is not working and will need to change that today that has some potential to help this surface. She does not tolerate debridement in this clinic very well. She is changing the dressing wants 04/03/16; patient arrives with the wounds looking somewhat better especially on the right. Dr. Migdalia Dk change the dressing to silver alginate when she saw her on Monday and also sold her some compression socks. The usefulness of the latter is really not clear and woman with severely draining wounds. 04/10/16; the patient is doing a bit of an experiment wearing the compression stockings that Dr. Migdalia Dk provided her to her left leg and the out of legs based dressings that we provided to the right. 05/01/16; the patient is continuing to wear compression stockings Dr. Migdalia Dk provided her on the left that are apparently silver impregnated. She has been using Iodoflex to the right leg wound. Still a moderate amount of drainage, when she leaves here the wraps only last for 4 days. She has to change the stocking on the left leg every night 05/15/16; she is now using compression stockings bilaterally provided by Dr. Marla Roe. She is wearing a nonadherent layer over the wounds so really I don't think there is anything specific being done to this now. She has some reduction on the left wound. The right is stable. I think all healing here is being done without a specific dressing 06/09/16; patient arrives here today with not much change in the wound certainly in diameter to large circular wounds over the medial aspect of her ankle bilaterally. Under the light of these services are  certainly not viable for healing. There is no evidence of surrounding infection. She is wearing compression stockings with some sort of silver impregnation as prescribed by Dr. Marla Roe. She has a follow-up with her tomorrow. 06/30/16; no major change in the size or condition of her wounds. These are still probably covered with a nonviable surface. She is using only her purchase stockings. She did see Dr. Marla Roe who seemed to want to apply Dakin's solution to this I'm not extreme short what value this would be. I would suggest Iodoflex which she still has at home. 07/28/16; I follow Mrs. Colton episodically along with Dr. Marla Roe. 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 She has been applying a topical collagen ointment to both wounds with Adaptic. I don't think Dr. Marla Roe is planning to take her back to the OR. 08/19/16; I follow Mrs. Jeneen Rinks on a monthly basis along with Dr. Marla Roe of plastic surgery. 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 I been following her for a number of years. At one point I was able to get the right medial malleolus wound to heal and had the left medial malleolus down to about half its current size however and I had to send her to plastic surgery for an operative debridement. Since then things have been stable to slightly improve the area on the right is slightly better one in the left about the same although there is much less adherent surface than I'm  used to with this patient. She is using some form of liquid collagen gel that Dr. Marla Roe provided a Kerlix cover with the patient's own pressure stockings. She tells me that she has extreme pain in both ankles and along the lateral aspect of both feet. She has been unable to work for some period of time. She is telling me she is retiring at the beginning of April. She sees Dr.  Doran Durand of orthopedics next week 09/22/16; patient has not seen Dr. Marla Roe since the last time she is here. I'm not really sure what she is using to the wounds other than bits and pieces of think she had left over including most recently Hydrofera Blue. She is using juxtalite stockings. She is having difficulty with her husband's recent illness "stroke". She is having to transport him to various doctors appointments. Dr. Marla Roe left her the option of a repeat debridement with ACEL however she has not been able to get the time to follow-up on this. She continues to have a fair amount of drainage out of these wounds with certainly precludes leaving dressings on all week 10/13/16; patient has not seen Dr. Marla Roe since she was last in our clinic. I'm not really sure what she is doing with the wounds, we did try to get her [facility] and I think she is actually using this most of the time. Because of drainage she states she has to change this every second day although this is an improvement from what she used to do. She went to see Dr. Doran Durand who did not think she had a muscular issue with regards to her feet, he referred her to a neurologist and I think the appointment is sometime in June. I changed her back to Iodoflex which she has used in the past but not recently. 11/03/16; the patient has been using Iodoflex although she ran out of this. Still claims that there is a lot of drainage although the wound does not look like this. No surrounding erythema. She has not been back to see Dr. Marla Roe 11/24/16; the patient has been using Iodoflex again but she ran out of it 2 or 3 days ago. There is no major change in the condition of either one of these wounds in fact they are larger and covered in a thick adherent surface slough/nonviable tissue especially on the left. She does not tolerate mechanical debridement in our clinic. Going back to see Dr. Marla Roe of plastic surgery for an operative  debridement would seem reasonable. 12/15/16; the patient has not been back to see Dr. Marla Roe. She is been dealing with a series of illnesses and her husband which of monopolized her time. She is been using Sorbact which we largely supplied. She states the drainage is bad enough that it maximum she can go 2-3 days without changing the dressing 01/12/2017 -- the patient has not been back for about 4 weeks and has not seen Dr. Marla Roe not does she have any appointment pending. 01/23/17; patient has not seen Dr. Marla Roe even though I suggested this previously. She is using Santyl that was suggested last week by Dr. Con Memos this Cost her $16 through her insurance which is indeed surprising 02/12/17; continuing Santyl and the patient is changing this daily. A lot of drainage. She has not been back to see plastic surgery she is using an Ace wrap. Our intake nurse suggested wrap around stockings which would make a good reasonable alternative 02/26/17; patient is been using Santyl and changing this daily  due to drainage. She has not been to see plastic surgery she uses in April Ace wrap to control the edema. She did obtain extremitease stockings but stated that the edema in her leg was to big for these 03/20/17; patient is using Santyl and Anasept. Surfaces looked better today the area on the right is actually measuring a little smaller. She has states she has a lot of pain in her feet and ankles and is asking for a consult to pain control which I'll try to help her with through our case manager. 04/10/17; the patient arrives with better-looking wound surfaces and is slightly smaller wound on the left she is using a combination of Santyl and Anasept. She has an appointment or at least as started in the pain control center associated with [facility] 05/14/17; this is a patient who I followed for a prolonged period of time. 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 At one  point I had this down to a much smaller wound on the left however these reopened and we've never been able to get these to heal. She has been using Santyl and Anasept gel although 2 weeks ago she ran out of the Anasept gel. She has a stable appearance of the wound. She is going to the wound care clinic at [facility]. They wanted do a nerve block/spinal block although she tells me she is reluctant to go forward with that. 05/21/17; this is a patient I have followed for many years. 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 Chronic pain and deformity in her ankles as well. She is been to see plastic surgery as well as orthopedics. Using PolyMem AG most recently/Kerramax/ABDs and 2 layer compression. She has managed to keep this on and she is coming in for a nurse check to change the dressing on Tuesdays, we see her on Fridays 06/05/17; really quite a good looking surface and the area especially on the right medial has contracted in terms of dimensions. Well granulated healthy-looking tissue on both sides. Even with an open curet there is nothing that even feels abnormal here. This is as good as I've seen this in quite some time. We have been using PolyMem AG and bringing her in for a nurse check 06/12/17; really quite good surface on both of these wounds. The right medial has contracted a bit left is not. We've been using PolyMem and AG and she is coming in for a nurse visit 06/19/17; we have been using PolyMem AG and bringing her in for a nurse check. Dimensions of her wounds are not better but the surfaces looked better bilaterally. She complained of bleeding last night and the left wound and increasing pain bilaterally. She states her wound pain is more neuropathic than just the wounds. There was some suggestion that this was radicular from her pain management doctor in talking to her it is really difficult to sort this out. 06/26/17; using PolyMem and AG and bringing her  in for a nurse check as All of this and reasonably stable condition. Certainly not improved. The dimensions on the lateral part of the right leg look better but not really measuring better. The medial aspect on the left is about the same. 07/03/16; we have been using PolyMen AG and bringing her in for a nurse check to change the dressings as the wounds have drainage which precludes once weekly changing. We are using all secondary absorptive dressings.our intake nurse is brought up the  idea of using a wound VAC/snap VAC on the wound to help with the drainage to see if this would result in some contraction. This is not a bad idea. The area on the right medial is actually looking smaller. Both wounds have a reasonable-looking surface. There is no evidence of cellulitis. The edema is well controlled 07/10/17; the patient was denied for a snap VAC by her insurance. The major issue with these wounds continues to be drainage. We are using wicked PolyMem AG and she is coming in for a nurse visit to change this. The wounds are stable to slightly improved. The surface looks vibrant and the area on the right certainly has shrunk in size but very slowly 07/17/17; the patient still has large wounds on her bilateral medial malleoli. Surface of both of these wounds looks better. The dimensions seem to come and go but no consistent improvement. There is no epithelialization. We do not have options for advanced treatment products due to insurance issues. They did not approve of the wound VAC to help control the drainage. More recently we've been using PolyMem and AG wicked to allow drainage through. We have been bringing her in for a nurse visit to change this. We do not have a lot of options for wound care products and the home again due to insurance issues 07/24/17; the patient's wound actually looks somewhat better today. No drainage measurements are smaller still healthy-looking surface. We used silver collagen under  PolyMen started last week. We have been bringing her in for a dressing change 07/31/17; patient's wound surface continued to look better and I think there is visible change in the dimensions of the wound on the right. Rims of epithelialization. We have been using silver collagen under PolyMen and bringing her in for a dressing change. There appears to be less drainage although she is still in need of the dressing change 08/07/17. Patient's wound surface continues to look better on both sides and the area on the right is definitely smaller. We have been using silver collagen and PolyMen. She feels that the drainage has been it has been better. I asked her about her vascular status. She went to see Dr. Aleda Grana at [facility] and had some form of ablation. I don't have much detail on this. I haven't my notes from 2016 that she was not a candidate for any further ablation but I don't have any more information on this. We had referred her to vein and vascular I don't think she ever went. He does not have a history of PAD although I don't have any information on this either. We don't even have ABIs in our record 08/14/17; we've been using silver collagen and PolyMen cover. And putting the patient and compression. She we are bringing her in as a nurse visit to change this because ofarge amount of drainage. We didn't the ABIs in clinic today since they had been done in many moons 1.2 bilaterally. She has been to see vein and vascular however this was at [facility] and she had ablation although I really don't have any information on this all seemed biking get a report. She is also been operatively debrided by plastic surgery and had a cell placed probably 8-12 months ago. This didn't have a major effect. We've been making some gains with current dressings 08/19/17-She is here in follow-up evaluation for bilateral medial malleoli ulcers. She continues to tolerate debridement very poorly. We will continue  with recently changed topical treatment; if  no significant improvement may consider switching to Iodosorb/Iodoflex. 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 She has been using silver collagen and PolyMem. I believe she has been used and tried on Iodoflex before. 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 These have been very refractory over time. We have been using silver collagen and PolyMen. She is coming in once a week for a doctor's and once a week for nurse visits. We are actually making some progress 09/18/17; the patient's wounds are smaller especially on the right medial. She arrives today to upset to consider even washing these off with Anasept which I think is been part of the reason this is been closing. We've been using collagen covered in PolyMen otherwise. It is noted that she has a small area of folliculitis on the right medial calf that. As we are wrapping her legs I'll give her a short course of doxycycline to make sure this doesn't amount to anything. She is a long list of complaints today including imbalance, shortness of breath on exertion, inversion of her left ankle. With regards to the latter complaints she is been to see orthopedics and they offered her a tendon release surgery I believe but wanted her wounds to be closed first. I have recommended she go see her primary physician with regards to everything else. 09/25/17; patient's wounds are about the same size. We have made some progress bilaterally although not in recent weeks. She will not allow me T wash these wounds with Anasept even if she is doing her cell. Wheeze we've been using collagen covered in PolyMen. Last week she had a small area of folliculitis this is now opened into a  small wound. She completed 5 days of trimethoprim sulfamethoxazole 10/02/17; unfortunately the area on her left medial ankle is worse with a larger wound area towards the Achilles. The patient complains of a lot of pain. She will not allow debridement although visually I don't think there is anything to debridement in any case. We have been using silver collagen and PolyMen for several months now. Initially we are making some progress although I'm not really seeing that today. We will move back to [facility]. His admittedly this is a bit of a repeat however I'm hoping that his situation is different now. The patient tells me she had her leg on the left give out on her yesterday this is process some pain. 10/09/17; the patient is seen twice a week largely because of drainage issues coming out of the chronic medial bimalleolar wounds that are chronic. Last week the dimensions of the one on the left looks a little larger I changed her to [facility]. She comes in today with a history of terrible pain in the bilateral wound areas. She will not allow debridement. She will not even allow a tissue culture. There is no surrounding erythema no no evidence of cellulitis. We have been putting her Kerlix Coban man. She will not allow more aggressive compression as there was a suggestion to put her in 3 layer wraps. 10/16/17; large wounds on her bilateral medial malleoli. These are chronic. Not much change from last week. The surface looks have healthy but absolutely no epithelialization. A lot of pain little less so of drainage. She will not allow debridement  or even washing these off in the vigorous fashion with Anasept. 10/23/17; large wounds on her bilateral malleoli which are chronic. Some improvement in terms of size perhaps on the right since last time I saw these. She states that after we increased the 3 layer compression there was some bleeding, when she came in for a nurse visit she did not want 3 layer  compression put back on about our nurse managed to convince her. She has known chronic venous visit issues and I'm hoping to get her to tolerate the 3 layer compression. using Hydrofera Blue 10/30/17; absolutely no change in the condition of either wound although we've had some improvement in dimensions on the right.. Attempted to put her in 3 layer compression she didn't tolerated she is back in 2 layer compression. We've been using Hydrofera Blue We looked over her past records. She had venous reflux studies in November 2016. There was no evidence of deep venous reflux on the right. Superficial vein did not show the greater saphenous vein at think this is been previously ablated the small saphenous vein was within normal limits. The left deep venous system showed no DVT the vessels were positive for deep venous reflux in the posterior tibial veins at the ankle. The greater saphenous vein was surgically absent small saphenous vein was within normal limits. She went to vein and vascular at [facility]. I believe she had an ablation on the left greater saphenous vein. I'll update her reflux studies perhaps ever reviewed by vein and vascular. We've made absolutely no progress in these wounds. Will also try to read and TheraSkins through her insurance 11/06/17; W the patient apparently has a 2 week follow-up with vein and vascular I like him to review the whole issue with regards to her previous vascular workup by Dr. Aleda Grana. We've really made no progress on these wounds in many months. She arrives today with less viable looking surface on the left medial ankle wound. This was apparently looking about the same on Tuesday when she was here for nurse visit. 11/13/17; deep tissue culture I did last time of the left lower leg showed multiple organisms without any predominating. In particular no Staphylococcus or group A strep were isolated. We sent her for venous reflux studies. She's had a previous  left greater saphenous vein stripping and I think sclerotherapy of the right greater saphenous vein. She didn't really look at the lesser saphenous vein this both wounds are on the medial aspect. She has reflux in the common femoral vein and popliteal vein and an accessory vein on the right and the common femoral vein and popliteal vein on the left. I'm going to have her go to see vein and vascular just the look over things and see if anything else beside aggressive compression is indicated here. We have not been able to make any progress on these wounds in spite of the fact that the surface of the wounds is never look too bad. 11/20/17; no major change in the condition of the wounds. Patient reports a large amount of drainage. She has a lot of complaints of pain although enlisting her today I wonder if some of this at least his neuropathic rather than secondary to her wounds. She has an appointment with vein and vascular on 12/30/17. The refractory nature of these wounds in my mind at least need vein and vascular to look over the wounds the recent reflux studies we did and her history to see if anything further can  be done here. I also note her gait is deteriorated quite a bit. 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 She has a bilateral Trendelenburg gait. I wonder if this is neuropathic or perhaps multilevel radicular. 11/27/17; her wounds actually looks slightly better. Healthy-looking granulation tissue a scant amount of epithelialization. [place] healthcare will not pay for [employer]. They will play for tri layer Oasis and Dermagraft. This is not a diabetic ulcer. We'll try for the tri layer Oasis. She still complains of some drainage. She has a vein and vascular appointment on 12/30/17 12/04/17; the wounds visually look quite good. Healthy-looking granulation with some degree of epithelialization. We are still waiting for response to our request for trial to try layer Oasis. Her  appointment with vascular to review venous and arterial issues isn't sold the end of July 7/31. Not allow debridement or even vigorous cleansing of the wound surface. 12/18/17; slightly smaller especially on the right. 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 We've been using Hydrofera Blue. We still are looking into triple layer Oasis through her insurance 01/08/18 on evaluation today patient's wound actually appears to be showing signs of good improvement at this point in time. She has been tolerating the dressing changes without complication. Fortunately there does not appear to be any evidence of infection at this point in time. We have been utilizing silver nitrate which does seem to be of benefit for her which is also good news. Overall I'm very happy with how things seem to be both regards appearance as well as measurement. Patient did see Dr. Bridgett Larsson for evaluation on 12/30/17. In his assessment he felt that stripping would not likely add much more than chronic compression to the patient's healing process. His recommendation was to follow-up in three months with Dr. Doren Custard if she hasn't healed in order to consider referral back to you and see vascular where she previously was in a trial and was able to get her wound to heal. I'll be see what she feels she when you staying compression and he reiterated this as well. 01/13/18 on evaluation today patient appears to actually be doing very well in regard to her bilateral medial malleolus ulcers. She seems to have tolerated the chemical cauterization with silver nitrate last week she did have some pain through that evening but fortunately states that I'll be see since it seems to be doing better she is overall pleased with the progress. 01/21/18; really quite a remarkable improvement since I've last seen these wounds. We started using silver nitrate specially on the islands of hyper granulation which for some reason her around  the wound circumference. This is really done quite nicely. Primary dressing Hydrofera Blue under 4 layer compression. She seems to be able to hold out without a nurse rewrap. Follow-up in 1 week 01/28/18; we've continued the hydrofera blue but continued with chemical cauterization to the wound area that we started about a month ago for irregular hyper granulation. She is made almost stunning improvement in the overall wound dimensions. I was not really expecting this degree of improvement in these chronic wounds 02/05/18; we continue with Hydrofera Bluebut of also continued the aggressive chemical cauterization with silver nitrate. We made nice progress with the right greater than left wound. 02/12/18. We continued with Hydrofera Blue after aggressive chemical cauterization with silver nitrate. We appear to be making nice progress with both wound areas 02/19/2018; we continue with Hydrofera Blue after washing the wounds vigorously with Anasept spray  and chemical cauterization with silver nitrate. We are making excellent progress. The area on the right's just about closed 02/26/2018. The area on the left medial ankle had too much necrotic debris today. I used a #5 curette we are able to get most of the soft. I continued with the silver nitrate to the much smaller wound on the right medial ankle she had a new area on her right lower pretibial area which she says was due to a role in her compression 03/05/2018; both wound areas look healthy. Not much change in dimensions from last week. I continue to use silver nitrate and Hydrofera Blue. The patient saw Dr. Doren Custard of vein and vascular. He felt she had venous stasis ulcers. He felt based on her previous arteriogram she should have adequate circulation for healing. Also she has deep venous reflux but really no significant correctable superficial venous reflux at this time. He felt we should continue with conservative management including leg elevation and  compression 04/02/2018; since we last saw this woman about a month ago she had a fall apparently suffered a pelvic fracture. I did not look up the x-ray. Nevertheless because of pain she literally was bedbound for 2 weeks and had home health coming out to change the dressing. Somewhat predictably this is resulted in considerable improvement in both wound areas. The right is just about closed on the medial malleolus and the left is about half the size. 04/16/2018; both her wounds continue to go down in size. Using Hydrofera Blue. 05/07/18; both her wounds appeared to be improving especially on the right where it is almost closed. We are using Hydrofera Blue 05/14/2018; slightly worse this week with larger wounds. Surface on the left medial not quite as good. We have been using Hydrofera Blue 05/21/18; again the wounds are slightly larger. Left medial malleolus slightly larger with eschar around the circumference. We have been using Hydrofera Blue undergoing a wraps for a prolonged period of time. This got a lot better when she was more recumbent due to a fall and a back injury. I change the primary dressing the silver alginate today. She did not tolerate a 4 layer compression previously although I may need to bring this up with her next time 05/28/2018; area on the left medial malleolus again is slightly larger with more drainage. Area on the right is roughly unchanged. She has a small area of folliculitis on the right medial just on the lower calf. This does not look ominous. 06/03/2018 left medial malleolus slightly smaller in a better looking surface. We used silver nitrate on this last time with silver alginate. The area on the right appears slightly smaller 1/10; left medial malleolus slightly smaller. Small open area on the right. We used silver nitrate and silver alginate as of 2 weeks ago. We continue with the wound and compression. These got a lot better when she was off her feet 1/17; right  medial malleolus wound is smaller. The left may be slightly smaller. Both surfaces look somewhat better. 1/24; both wounds are slightly smaller. Using silver alginate under Unna boots 1/31; both wounds appear smaller in fact the area on the right medial is just about closed. Surface eschar. We have been using silver alginate under Unna boots. The patient is less active now spends let much less time on her feet and I think this is contributed to the general improvement in the wound condition 2/7; both wounds appear smaller. I was hopeful the right medial would be closed  however there there is still the same small open area. Slight amount of surface eschar on the left the dimensions are smaller there is eschar but the wound edges appear to be free. We have been using silver alginate under Unna boot's 2/14; both wounds once again measure smaller. Circumferential eschar on the left medial. We have been using silver alginate under Unna boots with gradual improvement 2/21; the area on the right medial malleolus has healed. The area on the left is smaller. We have been using silver alginate and Unna boots. We can discharge wrapping the right leg she has 20/30 stockings at home she will need to protect the scar tissue in this area 2/28; the area on the right medial malleolus remains closed the patient has a compression stocking. The area on the left is smaller. We have been using silver alginate and Unna boots. 3/6 the area on the right medial ankle remains closed. Good edema control noted she is using her own compression stocking. The area on the left medial ankle is smaller. We have been managing this with silver alginate and Unna boots which we will continue today. 3/13; the area on the right medial ankle remains closed and I'm declaring it healed today. When necessary the left is about the same still a healthy-looking surface but no major change and wound area. No evidence of infection and using silver  alginate under unna and generally making considerable improvement 3/27 the area on the right medial ankle remains closed the area on the left is about the same as last week. Certainly not any worse we have been using silver alginate under an Unna boot 4/3; the area on the right medial ankle remains closed per the patient. We did not look at this wound. The wound on the left medial ankle is about the same surface looks healthy we have been using silver alginate under an Unna boot 4/10; area on the right medial ankle remains closed per the patient. We did not look at this wound. The wound on the left medial ankle is slightly larger. The patient complains that the [facility] caused burning pain all week. She also told us that she was a lot more active this week. Changed her back to silver alginate 4/17; right medial ankle still closed per the patient. Left medial ankle is slightly larger. Using silver alginate. She did not tolerate Hydrofera Blue on this area 4/24; right medial ankle remains closed we have not look at this. The left medial ankle continues to get larger today by about a centimeter. We have been using silver alginate under Unna boots. She complains about 4 layer compression as an alternative. She has been up on her feet working on her garden 5/8; right medial ankle remains closed we did not look at this. The left medial ankle has increased in size about 100%. We have been using silver alginate under Unna boots. She noted increased pain this week and was not surprised that the wound is deteriorated 5/15; no major change in SA however much less erythema ( one week of doxy ocellulitis). 5/22-64 year old female returns at 1 week to the clinic for left medial ankle wound for which we have been using silver alginate under 3 layer compression She was placed on DOXY at last visit - the wound is wider at this visit. She is in 3 layer compression 5/29; change to [facility] last week. I had  given her empiric doxycycline 2 weeks ago for a week. She is in  3 layer compression. She complains of a lot of pain and drainage on presentation today. 6/5; using Hydrofera Blue. I gave her doxycycline recently empirically for erythema and pain around the wound. Believe her cultures showed enterococcus which not would not have been well covered by doxycycline nevertheless the wound looks better and I don't feel specifically that the enterococcus needs to be covered. She has a new what looks like a wrap injury on her lateral left ankle. 6/12; she is using Hydrofera Blue. She has a new area on the left anterior lower tibial area. This was a wrap injury last week. 6/19; the patient is using Hydrofera Blue. She arrived with marked inflammation and erythema around the wound and tenderness. 12/01/18 on evaluation today patient appears to be doing a little bit better based on what I'm hearing from the standpoint of lassos evaluation to this as far as the overall appearance of the wound is concerned. Then sometime substandard she typically sees Dr. Dellia Nims. Nonetheless overall very pleased with the progress that she's made up to this point. No fevers, chills, nausea, or vomiting noted at this time. 7/10; some improvement in the surface area. Aggressively debrided last week apparently. I went ahead with the debridement today although the patient does not tolerate this very well. We have been using Iodoflex. Still a fair amount of drainage 7/17; slightly smaller. Using Iodoflex. 7/24; no change from last week in terms of surface area. We have been using Iodoflex. Surface looks and continues to look somewhat better 7/31; surface area slightly smaller better looking surface. We have been using Iodoflex. This is under Unna boot compression 8/7-Patient presents at 1 week with Unna boot and Iodoflex, wound appears better 8/14-Patient presents at 1 week with Iodoflex, we use the Unna boot, wound appears to be stable  better.Patient is getting Botox treatment for the inversion of the foot for tendon release, Next week 8/21; we are using Iodoflex. Unna boot. The wound is stable in terms of surface area. Under illumination there is some areas of the wound that appear to be either epithelialized or perhaps this is adherent slough at this point I was not really clear. It did not wipe off and I was reluctant to debride this today. 8/28; we are using Iodoflex in an Unna boot. Seems to be making good improvement. 9/4; using Iodoflex and wound is slightly smaller. 9/18; we are using Iodoflex with topical silver nitrate when she is here. The wound continues to be smaller 10/2; patient missed her appointment last week due to GI issues. She left and Iodoflex based dressing on for 2 weeks. Wound is about the same size about the size of a dime on the left medial lower 10/9 we have been using Iodoflex on the medial left ankle wound. She has a new superficial probable wrap injury on the dorsal left ankle 10/16; we have been using Hydrofera Blue since last week. This is on the left medial ankle 10/23; we have been using Hydrofera Blue since 2 weeks ago. This is on the left medial ankle. Dimensions are better 11/6; using Hydrofera Blue. I think the wound is smaller but still not closed. Left medial ankle 11/13; we have been using Hydrofera Blue. Wound is certainly no smaller this week. Also the surface not as good. This is the remanent of a very large area on her left medial ankle. 11/20; using Sorbact since last week. Wound was about the same in terms of size although I was disappointed about the surface  debris 12/11; 3-week follow-up. Patient was on vacation. Wound is measuring slightly larger we have been using Sorbact. 12/18; wound is about the same size however surface looks better last week after debridement. We have been using Sorbact under compression 1/15 wound is probably twice the size of last time increased in length  nonviable surface. We have been using Sorbact. She was running a mild fever and missed her appointment last week 1/22; the wound is come down in size but under illumination still a very adherent debris we have been Hydrofera Blue that I changed her to last week 1/29; dimensions down slightly. We have been using Hydrofera Blue 2/19 dimensions are the same however there is rims of epithelialization under illumination. Therefore more the surface area may be epithelialized 2/26; the patient's wound actually measures smaller. The wound looks healthy. We have been using Hydrofera Blue. I had some thoughts about running Apligraf then I still may do that however this looks so much better this week we will delay that for now 3/5; the wound is small but about the same as last week. We have been using Hydrofera Blue. No debridement is required today. 3/19; the wound is about the size of a dime. Healthy looking wound even under illumination. We have been using Hydrofera Blue. No mechanical debridement is necessary 3/26; not much change from last week although still looks very healthy. We have been using Hydrofera Blue under Unna boots Patient was offered an ankle fusion by podiatry but not until the wound heals with a proceed with this. 4/9; the patient comes in today with her original wound on the medial ankle looking satisfactory however she has some uncontrolled swelling in the middle part of her leg with 2 new open areas superiorly just lateral to the tibia. I think this was probably a wrap issue. She said she felt uncomfortable during the week but did not call in. We have been using Hydrofera Blue 4/16; the wound on the medial ankle is about the same. She has innumerable small areas superior to this across her mid tibia. I think this is probably folliculitis. She is also been working in the yard doing a lot of sweating 4/30; the patient issue on the upper areas across her mid tibia of all healed. I think  this was excessive yard work if I remember. Her wound on the medial ankle is smaller. Some debris on this we have been using Hydrofera Blue under Unna boots 5/7; mid tibia. She has been using Hydrofera Blue under an Unna wrap. She is apparently going for her ankle surgery on June 3 10/28/19-Patient returns to clinic with the ankle wound, we are using Hydrofera Blue under Unna wrap, surgery is scheduled for her left foot for June 3 so she will be back for nurse visit next week READMISSION 01/17/2020 Mrs. Diniz is a 64 year old woman we have had in this clinic for a long period of time with severe venous hypertension and refractory wounds on her medial lower legs and ankles bilaterally. 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 When she was off her legs for a prolonged period example when she fell and suffered a compression fracture things would heal up quite nicely. She is now retired and we managed to heal up the right medial leg wound. The left one was very tiny last time I saw this although  still refractory. 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 It got to the point where this was interfering with her walking and she elected to proceed with a ankle arthrodesis to straighten her her ankle and leave her with a functional outcome for mobilization. The patient was referred to Dr. Doren Custard and really this took some time to arrange. Dr. Doren Custard saw her on 12/07/2019. Once again he verified that she had no arterial issues. She had previously had an angiogram several years ago. Follow-up ABIs on the left showed an ABI of 1.12 with triphasic waveforms and a TBI of 0.92. She is felt to have chronic deep venous insufficiency but I do not think it was felt that anything could be done from about this from  an ablation point of view. At the time Dr. Doren Custard saw this patient the wounds actually look closed via the pictures in his clinic. The patient finally underwent her surgery on 12/15/2019. This went reasonably well and there was a good anatomic outcome. She developed a small distal wound dehiscence on the lateral part of the surgical wound. However more problematically she is developed recurrence of the wound on the medial left ankle. There are actually 2 wounds here one in the distal lower leg and 1 pretty much at the level of the medial malleolus. It is a more distal area that is more problematic. She has been using Hydrofera Blue which started on Friday before this she was simply Ace wrapping. There was a culture done that showed Pseudomonas and she is on ciprofloxacin. A recent CNS on 8/11 was negative. The patient reports some pain but I generally think this is improving. She is using a cam boot completely nonweightbearing using a walker for pivot transfers and a wheelchair 8/24; not much improvement unfortunately she has a surgical wound on the lateral part in the venous insufficiency wound medially. The bottom part of the medial insufficiency wound is still necrotic there is exposed tendon here. We have been using Hydrofera Blue under compression. Her edema control is however better 8/31; patient in for follow-up of his surgical wound on the lateral part of her left leg and chronic venous insufficiency ulcers medially. We put her back in compression last week. She comes in today with a complaint of 3 or 4 days worth of increasing pain. She felt her cam walker was rubbing on the area on the back of her heel. However there is intense erythema seems more likely she has cellulitis. She had 2 cultures done when she was seeing podiatry in the postop. One of them in late July showed Pseudomonas and she received a course of ciprofloxacin the other was negative on 8/11 she is allergic to penicillin  with anaphylactoid complaints of hives oral swelling via information in epic 9/9; when I saw this patient last week she had intense anterior erythema around her wound on the right lateral heel and ankle and also into the right medial heel. Some of this was no doubt drainage and her walker boot however I was convinced she had cellulitis. I gave her Levaquin and Bactrim she is finishing up on this now. She is following up with Dr. [name] he saw her yesterday. He is taken her out of the walking boot of course she is still nonweightbearing. Her x-ray was negative for any worrisome features such as soft tissue air etc. Things are a lot better this week. She has  home health. We have been using Hydrofera Blue under an [employer] which she put back on yesterday. I did not wrap her last week 9/17; her surrounding skin looks a lot better. In fact the area on the left lateral ankle has just a scant amount of eschar. The only remaining wound is the large area on the left medial ankle. Probably about 60% of this is healthy granulation at the surface however she has a significant divot distally. This has adherent debris in it. I been using debridement and silver collagen to try and get this area to fill-in although I do not think we have made much progress this week 9/24; the patient's wound on the left medial ankle looks a lot better. The deeper divot area distally still requires debridement but this is cleaning up quite nicely we have been using silver collagen. The patient is complaining of swelling in her foot and is worried that that is contributing to the nonhealing of the ankle wound. She is also complaining of numbness in her anterior toes 10/4; left medial ankle. The small area distally still has a divot with necrotic material that I have been debriding away. This has an undermining area. She is approved for Apligraf. She saw Dr. [name] her surgeon on 10/1. I think he declared himself is satisfied with the  condition of things. Still nonweightbearing till the end of the month. 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 Her surgical wound is well closed. There is no evidence of infection 10/11; the patient arrived in clinic today with the expectation that we be able to put an Apligraf on this area after debridement however she arrives with a relatively large amount of green drainage on the dressing. The patient states that this started on Friday. She has not been systemically unwell. Objective Constitutional Sitting or standing Blood Pressure is within target range for patient.. Pulse regular and within target range for patient.Marland Kitchen Respirations regular, non-labored and within target range.. Temperature is normal and within the target range for the patient.Marland Kitchen Appears in no distress. Vitals Time Taken: 10:14 AM, Height: 68 in, Temperature: 97.9 F, Pulse: 57 bpm, Respiratory Rate: 18 breaths/min, Blood Pressure: 105/41 mmHg. General Notes: Wound exam; left medial ankle. On the most distal part of this wound there is a divot we have noted to this before. I suspect this is where the drainage is coming from. I used a #5 curette to debride necrotic material from this wound and then culture the area. She is tender underneath this but there is no surrounding erythema. No palpable bone Integumentary (Hair, Skin) Wound #15 status is Open. Original cause of wound was Gradually Appeared. The wound is located on the Left,Medial Malleolus. The wound measures 5.3cm length x 2.9cm width x 0.9cm depth; 12.072cm^2 area and 10.864cm^3 volume. There is tendon and Fat Layer (Subcutaneous Tissue) exposed. There is no tunneling or undermining noted. There is a medium amount of purulent drainage noted. The wound margin is flat and intact. There is large (67-100%) red, pink granulation within the wound bed. There is a small (1-33%) amount of necrotic tissue within the wound bed including Adherent  Slough. Assessment Active Problems ICD-10 Chronic venous hypertension (idiopathic) with ulcer and inflammation of left lower extremity Non-pressure chronic ulcer of other part of left lower leg with other specified severity Non-pressure chronic ulcer of left ankle with other specified severity Procedures Wound #15 Pre-procedure diagnosis of Wound #15 is a Venous Leg Ulcer located on  the Left,Medial Malleolus .Severity of Tissue Pre Debridement is: Fat layer exposed. There was a Excisional Skin/Subcutaneous Tissue Debridement with a total area of 15.37 sq cm performed by [name]., MD. With the following instrument(s): Curette to remove Viable and Non-Viable tissue/material. Material removed includes Subcutaneous Tissue and Slough and. 1 specimen was taken by a Swab and sent to the lab per facility protocol. A time out was conducted at 10:35, prior to the start of the procedure. A Minimum amount of bleeding was controlled with Pressure. The procedure was tolerated well with a pain level of 5 throughout and a pain level of 3 following the procedure. Post Debridement Measurements: 5.3cm length x 2.9cm width x 0.9cm depth; 10.864cm^3 volume. Character of Wound/Ulcer Post Debridement is improved. Severity of Tissue Post Debridement is: Fat layer exposed. Post procedure Diagnosis Wound #15: Same as Pre-Procedure Pre-procedure diagnosis of Wound #15 is a Venous Leg Ulcer located on the Left,Medial Malleolus . There was a Haematologist Compression Therapy Procedure by Levan Hurst, RN. Post procedure Diagnosis Wound #15: Same as Pre-Procedure Plan Follow-up Appointments: Return Appointment in 1 week. Dressing Change Frequency: Wound #15 Left,Medial Malleolus: Change dressing three times week. - wound clinic to change on Mondays, home health to change on Wednesday and Friday Skin Barriers/Peri-Wound Care: Moisturizing lotion - to leg Wound Cleansing: May shower with protection. - use cast  protector Primary Wound Dressing: Wound #15 Left,Medial Malleolus: Calcium Alginate with Silver - thin layer of Gentamicin on wound bed under alginate Secondary Dressing: Wound #15 Left,Medial Malleolus: Dry Gauze ABD pad Edema Control: Unna Boot to Left Lower Extremity Avoid standing for long periods of time Elevate legs to the level of the heart or above for 30 minutes daily and/or when sitting, a frequency of: - throughout the day Off-Loading: Other: - ensure no pressure to wound areas. Home Health: [facility] for wound care. - [facility] to change twice a week Laboratory ordered were: Anerobic culture - left medial malleolus #1 I gave her empiric ciprofloxacin. 2. Is change the primary dressing to silver alginate with ABDs still under her her Unna boot compression. 3. We put gentamicin topically under the silver alginate with the thought that this is probably gram-negative rods. 4. Unfortunately we could not put an Apligraf done this today Electronic Signature(s) Signed: 03/12/2020 5:03:49 PM By: Linton Ham MD Entered By: Linton Ham on 03/12/2020 10:46:56 -------------------------------------------------------------------------------- SuperBill Details Patient Name: Date of Service: [name], [name] 03/12/2020 Medical Record Number: [id number] Patient Account Number: [id number] Date of Birth/Sex: Treating RN: Dec 02, 1955 (65 y.o. [name] Primary Care Provider: [name] Other Clinician: Referring Provider: Treating Provider/Extender: [name] in Treatment: 7 Diagnosis Coding ICD-10 Codes Code Description [phone] Chronic venous hypertension (idiopathic) with ulcer and inflammation of left lower extremity L97.828 Non-pressure chronic ulcer of other part of left lower leg with other specified severity L97.328 Non-pressure chronic ulcer of left ankle with other specified  severity Facility Procedures CPT4 Code: [id number] Description: 32671 - DEB SUBQ TISSUE 20 SQ CM/< ICD-10 Diagnosis Description L97.828 Non-pressure chronic ulcer of other part of left lower leg with other specified L97.328 Non-pressure chronic ulcer of left ankle with other specified severity Modifier: severity Quantity: 1 Physician Procedures : CPT4 Code Description Modifier [id number] - WC PHYS SUBQ TISS 20 SQ CM ICD-10 Diagnosis Description L97.828 Non-pressure chronic ulcer of other part of left lower leg with other specified severity L97.328 Non-pressure chronic ulcer of  left ankle  with other specified severity Quantity: 1 Electronic Signature(s) Signed: 03/12/2020 5:03:49 PM By: Linton Ham MD Entered By: Linton Ham on 03/12/2020 10:47:25

## 2020-03-12 NOTE — Telephone Encounter (Signed)
Please Advise

## 2020-03-13 ENCOUNTER — Other Ambulatory Visit: Payer: Self-pay | Admitting: Podiatry

## 2020-03-13 MED ORDER — OXYCODONE-ACETAMINOPHEN 5-325 MG PO TABS: 1.0000 | ORAL_TABLET | Freq: Three times a day (TID) | ORAL | 0 refills | Status: DC | PRN

## 2020-03-13 NOTE — Telephone Encounter (Signed)
Can we approve the medication on here so it will be removed please. Thanks

## 2020-03-13 NOTE — Telephone Encounter (Signed)
This needs to be addressed by Dr. [name]. I was doing nurse calls and refilled for Dr. [name] when he was out on paternity leave.

## 2020-03-13 NOTE — Telephone Encounter (Signed)
I sent in a prescription just now. - Dr. [name]

## 2020-03-13 NOTE — Telephone Encounter (Signed)
Thanks

## 2020-03-13 NOTE — Progress Notes (Signed)
PRN pain 

## 2020-03-13 NOTE — Progress Notes (Signed)
[name], [name] ([id number]) Visit Report for 03/12/2020 Arrival Information Details Patient Name: Date of Service: [facility] [name], [name] 03/12/2020 10:00 A M Medical Record Number: [id number] Patient Account Number: [id number] Date of Birth/Sex: Treating RN: 10-Jun-1955 (64 y.o. [name] Primary Care Jacob Cicero: [name] Other Clinician: Referring Solina Heron: Treating Jurnie Garritano/Extender: [name] in Treatment: 7 Visit Information History Since Last Visit Added or deleted any medications: No Patient Arrived: Ambulatory Any new allergies or adverse reactions: No Arrival Time: 10:13 Had a fall or experienced change in No Accompanied By: self activities of daily living that may affect Transfer Assistance: None risk of falls: Patient Identification Verified: Yes Signs or symptoms of abuse/neglect since last visito No Secondary Verification Process Completed: Yes Hospitalized since last visit: No Patient Requires Transmission-Based Precautions: No Implantable device outside of the clinic excluding No Patient Has Alerts: Yes cellular tissue based products placed in the center Patient Alerts: L ABI =1.12, TBI = .92 since last visit: R ABI= 1.02, TBI= .58 Has Dressing in Place as Prescribed: Yes Pain Present Now: Yes Electronic Signature(s) Signed: 03/13/2020 9:19:13 AM By: [name] Entered By: [name] on 03/12/2020 10:13:52 -------------------------------------------------------------------------------- Compression Therapy Details Patient Name: Date of Service: [name], [name] 03/12/2020 10:00 A M Medical Record Number: [id number] Patient Account Number: [id number] Date of Birth/Sex: Treating RN: 12-23-1955 (64 y.o. [name] Primary Care Starsky Nanna: [name] Other Clinician: Referring Antwine Agosto: Treating Demarie Uhlig/Extender: [name] in Treatment: 7 Compression Therapy Performed for  Wound Assessment: Wound #15 Left,Medial Malleolus Performed By: Clinician Levan Hurst, RN Compression Type: Rolena Infante Post Procedure Diagnosis Same as Pre-procedure Electronic Signature(s) Signed: 03/12/2020 5:10:40 PM By: Levan Hurst RN, BSN Entered By: Levan Hurst on 03/12/2020 10:39:52 -------------------------------------------------------------------------------- Encounter Discharge Information Details Patient Name: Date of Service: [name], [name] 03/12/2020 10:00 A M Medical Record Number: [id number] Patient Account Number: [id number] Date of Birth/Sex: Treating RN: Oct 02, 1955 (64 y.o. [name] Primary Care Joab Carden: [name] Other Clinician: Referring Leomia Blake: Treating Jeremie Abdelaziz/Extender: [name] in Treatment: 7 Encounter Discharge Information Items Post Procedure Vitals Discharge Condition: Stable Temperature (F): 97.9 Ambulatory Status: Walker Pulse (bpm): 57 Discharge Destination: Home Respiratory Rate (breaths/min): 18 Transportation: Private Auto Blood Pressure (mmHg): 105/41 Accompanied By: self Schedule Follow-up Appointment: Yes Clinical Summary of Care: Electronic Signature(s) Signed: 03/12/2020 5:08:17 PM By: Deon Pilling Entered By: Deon Pilling on 03/12/2020 11:01:39 -------------------------------------------------------------------------------- Lower Extremity Assessment Details Patient Name: Date of Service: [facility] [name], [name] 03/12/2020 10:00 A M Medical Record Number: [id number] Patient Account Number: [id number] Date of Birth/Sex: Treating RN: 03-21-1956 (64 y.o. [name] Primary Care Kalix Meinecke: [name] Other Clinician: Referring Veryl Abril: Treating Regnald Bowens/Extender: [name] in Treatment: 7 Edema Assessment Assessed: [Left: No] [Right: No] Edema: [Left: Ye] [Right: s] Calf Left: Right: Point of Measurement: From Medial Instep 30  cm Ankle Left: Right: Point of Measurement: From Medial Instep 20 cm Electronic Signature(s) Signed: 03/13/2020 5:49:00 PM By: [name] RN Entered By: [name] on 03/12/2020 10:18:45 -------------------------------------------------------------------------------- Multi Wound Chart Details Patient Name: Date of Service: [name] [name], [name] 03/12/2020 10:00 A M Medical Record Number: [id number] Patient Account Number: [id number] Date of Birth/Sex: Treating RN: January 21, 1956 (64 y.o. [name] Primary Care Zendaya Groseclose: [name] Other Clinician: Referring Theoden Mauch: Treating Azilee Pirro/Extender: [name] in Treatment: 7 Vital Signs Height(in): 68 Pulse(bpm): 57 Weight(lbs): Blood  Pressure(mmHg): 105/41 Body Mass Index(BMI): Temperature(F): 97.9 Respiratory Rate(breaths/min): 18 Photos: [15:No Photos Left, Medial Malleolus] [N/A:N/A N/A] Wound Location: [15:Gradually Appeared] [N/A:N/A] Wounding Event: [15:Venous Leg Ulcer] [N/A:N/A] Primary Etiology: [15:Peripheral Venous Disease,] [N/A:N/A] Comorbid History: [15:Osteoarthritis, Confinement Anxiety 12/30/2019] [N/A:N/A] Date Acquired: [15:7] [N/A:N/A] Weeks of Treatment: [15:Open] [N/A:N/A] Wound Status: [15:5.3x2.9x0.9] [N/A:N/A] Measurements L x W x D (cm) [15:12.072] [N/A:N/A] A (cm) : rea [15:10.864] [N/A:N/A] Volume (cm) : [15:-3.50%] [N/A:N/A] % Reduction in A [15:rea: -16.40%] [N/A:N/A] % Reduction in Volume: [15:Full Thickness With Exposed Support N/A] Classification: [15:Structures Medium] [N/A:N/A] Exudate A mount: [15:Purulent] [N/A:N/A] Exudate Type: [15:yellow, brown, green] [N/A:N/A] Exudate Color: [15:Flat and Intact] [N/A:N/A] Wound Margin: [15:Large (67-100%)] [N/A:N/A] Granulation A mount: [15:Red, Pink] [N/A:N/A] Granulation Quality: [15:Small (1-33%)] [N/A:N/A] Necrotic A mount: [15:Fat Layer (Subcutaneous Tissue): Yes N/A] Exposed Structures: [15:Tendon:  Yes Fascia: No Muscle: No Joint: No Bone: No Small (1-33%)] [N/A:N/A] Epithelialization: [15:Debridement - Excisional] [N/A:N/A] Debridement: Pre-procedure Verification/Time Out 10:35 [N/A:N/A] Taken: [15:Subcutaneous, Slough] [N/A:N/A] Tissue Debrided: [15:Skin/Subcutaneous Tissue] [N/A:N/A] Level: [15:15.37] [N/A:N/A] Debridement A (sq cm): [15:rea Curette] [N/A:N/A] Instrument: [15:Swab] [N/A:N/A] Specimen: [15:1] [N/A:N/A] Number of Specimens Taken: [15:Minimum] [N/A:N/A] Bleeding: [15:Pressure] [N/A:N/A] Hemostasis A chieved: [15:5] [N/A:N/A] Procedural Pain: [15:3] [N/A:N/A] Post Procedural Pain: [15:Procedure was tolerated well] [N/A:N/A] Debridement Treatment Response: [15:5.3x2.9x0.9] [N/A:N/A] Post Debridement Measurements L x W x D (cm) [15:10.864] [N/A:N/A] Post Debridement Volume: (cm) [15:Compression Therapy] [N/A:N/A] Procedures Performed: [15:Debridement] Treatment Notes Electronic Signature(s) Signed: 03/12/2020 5:03:49 PM By: Linton Ham MD Signed: 03/12/2020 5:10:40 PM By: Levan Hurst RN, BSN Entered By: Linton Ham on 03/12/2020 10:43:19 -------------------------------------------------------------------------------- Multi-Disciplinary Care Plan Details Patient Name: Date of Service: [facility] [name], [name] 03/12/2020 10:00 A M Medical Record Number: [id number] Patient Account Number: [id number] Date of Birth/Sex: Treating RN: 18-Mar-1956 (64 y.o. [name] Primary Care Kaleigh Spiegelman: Other Clinician: [name] Referring Orazio Weller: Treating Betul Brisky/Extender: [name] in Treatment: 7 Active Inactive Abuse / Safety / Falls / Self Care Management Nursing Diagnoses: Potential for falls Potential for injury related to falls Goals: Patient will not experience any injury related to falls Date Initiated: 01/17/2020 Target Resolution Date: 04/06/2020 Goal Status: Active Patient/caregiver will verbalize/demonstrate  measures taken to prevent injury and/or falls Date Initiated: 01/17/2020 Date Inactivated: 03/12/2020 Target Resolution Date: 03/09/2020 Goal Status: Met Interventions: Assess Activities of Daily Living upon admission and as needed Assess fall risk on admission and as needed Assess: immobility, friction, shearing, incontinence upon admission and as needed Assess impairment of mobility on admission and as needed per policy Assess personal safety and home safety (as indicated) on admission and as needed Assess self care needs on admission and as needed Provide education on fall prevention Provide education on personal and home safety Notes: Wound/Skin Impairment Nursing Diagnoses: Impaired tissue integrity Knowledge deficit related to ulceration/compromised skin integrity Goals: Patient/caregiver will verbalize understanding of skin care regimen Date Initiated: 01/17/2020 Target Resolution Date: 04/06/2020 Goal Status: Active Ulcer/skin breakdown will have a volume reduction of 30% by week 4 Date Initiated: 01/17/2020 Date Inactivated: 03/12/2020 Target Resolution Date: 03/09/2020 Goal Status: Met Interventions: Assess patient/caregiver ability to obtain necessary supplies Assess patient/caregiver ability to perform ulcer/skin care regimen upon admission and as needed Assess ulceration(s) every visit Provide education on ulcer and skin care Notes: Electronic Signature(s) Signed: 03/12/2020 5:10:40 PM By: Levan Hurst RN, BSN Entered By: Levan Hurst on 03/12/2020 10:29:51 -------------------------------------------------------------------------------- Pain Assessment Details Patient Name: Date of Service: [name], [name] 03/12/2020 10:00 A M Medical Record Number:  [id number] Patient Account Number: [id number] Date of Birth/Sex: Treating RN: 1956/04/16 (64 y.o. [name] Primary Care Jamoni Hewes: [name] Other Clinician: Referring Haneef Hallquist: Treating  Datrell Dunton/Extender: [name] in Treatment: 7 Active Problems Location of Pain Severity and Description of Pain Patient Has Paino Yes Site Locations Rate the pain. Current Pain Level: 4 Pain Management and Medication Current Pain Management: Electronic Signature(s) Signed: 03/12/2020 5:10:40 PM By: Levan Hurst RN, BSN Signed: 03/13/2020 9:19:13 AM By: [name] Entered By: [name] on 03/12/2020 10:14:03 -------------------------------------------------------------------------------- Patient/Caregiver Education Details Patient Name: Date of Service: [name], [name] 10/11/2021andnbsp10:00 A M Medical Record Number: [id number] Patient Account Number: [id number] Date of Birth/Gender: Treating RN: 07/18/1955 (64 y.o. [name] Primary Care Physician: [name] Other Clinician: Referring Physician: Treating Physician/Extender: [name] in Treatment: 7 Education Assessment Education Provided To: Patient Education Topics Provided Safety: Methods: Explain/Verbal Responses: State content correctly Wound/Skin Impairment: Methods: Explain/Verbal Responses: State content correctly Electronic Signature(s) Signed: 03/12/2020 5:10:40 PM By: Levan Hurst RN, BSN Entered By: Levan Hurst on 03/12/2020 10:30:07 -------------------------------------------------------------------------------- Wound Assessment Details Patient Name: Date of Service: [name], [name] 03/12/2020 10:00 A M Medical Record Number: [id number] Patient Account Number: [id number] Date of Birth/Sex: Treating RN: August 29, 1955 (64 y.o. [name] Primary Care Stephano Arrants: [name] Other Clinician: Referring Lakima Dona: Treating Eleftheria Taborn/Extender: [name] in Treatment: 7 Wound Status Wound Number: 15 Primary Venous Leg Ulcer Etiology: Wound Location: Left, Medial Malleolus Wound  Status: Open Wounding Event: Gradually Appeared Comorbid Peripheral Venous Disease, Osteoarthritis, Confinement Date Acquired: 12/30/2019 History: Anxiety Weeks Of Treatment: 7 Clustered Wound: No Photos Photo Uploaded By: Mikeal Hawthorne on 03/13/2020 13:24:57 Wound Measurements Length: (cm) 5.3 Width: (cm) 2.9 Depth: (cm) 0.9 Area: (cm) 12.072 Volume: (cm) 10.864 % Reduction in Area: -3.5% % Reduction in Volume: -16.4% Epithelialization: Small (1-33%) Tunneling: No Undermining: No Wound Description Classification: Full Thickness With Exposed Support Structures Wound Margin: Flat and Intact Exudate Amount: Medium Exudate Type: Purulent Exudate Color: yellow, brown, green Foul Odor After Cleansing: No Slough/Fibrino Yes Wound Bed Granulation Amount: Large (67-100%) Exposed Structure Granulation Quality: Red, Pink Fascia Exposed: No Necrotic Amount: Small (1-33%) Fat Layer (Subcutaneous Tissue) Exposed: Yes Necrotic Quality: Adherent Slough Tendon Exposed: Yes Muscle Exposed: No Joint Exposed: No Bone Exposed: No Treatment Notes Wound #15 (Left, Medial Malleolus) 1. Cleanse With Wound Cleanser Soap and water 2. Periwound Care Barrier cream Moisturizing lotion 3. Primary Dressing Applied Calcium Alginate Ag Other primary dressing (specifiy in notes) 4. Secondary Dressing ABD Pad Dry Gauze 6. Support Layer Production assistant, radio Notes gentamycin ointment applied under the alginate Ag. no kerlix with unna boot patient request. Electronic Signature(s) Signed: 03/13/2020 5:49:00 PM By: [name] RN Entered By: [name] on 03/12/2020 10:21:35 -------------------------------------------------------------------------------- Vitals Details Patient Name: Date of Service: [name], [name] 03/12/2020 10:00 A M Medical Record Number: [id number] Patient Account Number: [id number] Date of Birth/Sex: Treating RN: 06-18-1955 (64 y.o. [name] Primary  Care Evalise Abruzzese: [name] Other Clinician: Referring Yosiah Jasmin: Treating Muna Demers/Extender: [name] in Treatment: 7 Vital Signs Time Taken: 10:14 Temperature (F): 97.9 Height (in): 68 Pulse (bpm): 57 Respiratory Rate (breaths/min): 18 Blood Pressure (mmHg): 105/41 Reference Range: 80 - 120 mg / dl Electronic Signature(s) Signed: 03/13/2020 9:19:13 AM By: [name] Entered By: [name] on 03/12/2020 10:15:22

## 2020-03-15 LAB — AEROBIC CULTURE W GRAM STAIN (SUPERFICIAL SPECIMEN)

## 2020-03-19 ENCOUNTER — Encounter (HOSPITAL_BASED_OUTPATIENT_CLINIC_OR_DEPARTMENT_OTHER): Payer: Medicare Other | Admitting: Internal Medicine

## 2020-03-20 ENCOUNTER — Other Ambulatory Visit: Payer: Self-pay

## 2020-03-20 ENCOUNTER — Encounter (HOSPITAL_BASED_OUTPATIENT_CLINIC_OR_DEPARTMENT_OTHER): Payer: Medicare Other | Admitting: Internal Medicine

## 2020-03-20 NOTE — Progress Notes (Signed)
[name], [name] ([id number]) Visit Report for 03/20/2020 Arrival Information Details Patient Name: Date of Service: [facility], [name] 03/20/2020 8:30 A M Medical Record Number: [id number] Patient Account Number: [id number] Date of Birth/Sex: Treating RN: [birth year] (64 y.o. [name], [name] Primary Care Provider: [name] Other Clinician: Referring Provider: Treating Provider/Extender: [name] in Treatment: 9 Visit Information History Since Last Visit Added or deleted any medications: No Patient Arrived: [name] Any new allergies or adverse reactions: No Arrival Time: 08:43 Had a fall or experienced change in No Accompanied By: self activities of daily living that may affect Transfer Assistance: None risk of falls: Patient Identification Verified: Yes Signs or symptoms of abuse/neglect since last visito No Secondary Verification Process Completed: Yes Hospitalized since last visit: No Patient Requires Transmission-Based Precautions: No Implantable device outside of the clinic excluding No Patient Has Alerts: Yes cellular tissue based products placed in the center Patient Alerts: L ABI =1.12, TBI = .92 since last visit: R ABI= 1.02, TBI= .58 Has Dressing in Place as Prescribed: Yes Pain Present Now: Yes Electronic Signature(s) Signed: 03/20/2020 1:16:57 PM By: [name] Entered By: [name] on 03/20/2020 08:43:32 -------------------------------------------------------------------------------- Compression Therapy Details Patient Name: Date of Service: [name], [name] 03/20/2020 8:30 A M Medical Record Number: [id number] Patient Account Number: [id number] Date of Birth/Sex: Treating RN: 06/14/1955 (64 y.o. [name] Primary Care Provider: [name] Other Clinician: Referring Provider: Treating Provider/Extender: [name] in Treatment: 9 Compression Therapy Performed for Wound  Assessment: Wound #15 Left,Medial Malleolus Performed By: Clinician Kela Millin, RN Compression Type: Rolena Infante Post Procedure Diagnosis Same as Pre-procedure Electronic Signature(s) Signed: 03/20/2020 5:04:07 PM By: Deon Pilling Entered By: Deon Pilling on 03/20/2020 09:10:27 -------------------------------------------------------------------------------- Encounter Discharge Information Details Patient Name: Date of Service: [name], [name] 03/20/2020 8:30 A M Medical Record Number: [id number] Patient Account Number: [id number] Date of Birth/Sex: Treating RN: 21-Jun-1955 (64 y.o. [name] Primary Care Provider: [name] Other Clinician: Referring Provider: Treating Provider/Extender: [name] in Treatment: 9 Encounter Discharge Information Items Discharge Condition: Stable Ambulatory Status: Walker Discharge Destination: Home Transportation: Private Auto Accompanied By: self Schedule Follow-up Appointment: Yes Clinical Summary of Care: Patient Declined Electronic Signature(s) Signed: 03/20/2020 11:49:38 AM By: Kela Millin Entered By: Kela Millin on 03/20/2020 09:37:38 -------------------------------------------------------------------------------- Lower Extremity Assessment Details Patient Name: Date of Service: [facility] [name], [name] 03/20/2020 8:30 A M Medical Record Number: [id number] Patient Account Number: [id number] Date of Birth/Sex: Treating RN: [birth year] (64 y.o. [name] Primary Care Provider: [name] Other Clinician: Referring Provider: Treating Provider/Extender: [name] in Treatment: 9 Edema Assessment Assessed: [name]: Yes] [Right: No] Edema: [Left: Ye] [Right: s] Calf Left: Right: Point of Measurement: From Medial Instep 29 cm Ankle Left: Right: Point of Measurement: From Medial Instep 23 cm Vascular Assessment Pulses: Dorsalis  Pedis Palpable: [Left:Yes] Electronic Signature(s) Signed: 03/20/2020 5:04:07 PM By: Deon Pilling Entered By: Deon Pilling on 03/20/2020 08:57:36 -------------------------------------------------------------------------------- Multi Wound Chart Details Patient Name: Date of Service: [name], [name] 03/20/2020 8:30 A M Medical Record Number: [id number] Patient Account Number: [id number] Date of Birth/Sex: Treating RN: 1955-06-09 (64 y.o. [name] Primary Care Provider: [name] Other Clinician: Referring Provider: Treating Provider/Extender: [name] in Treatment: 9 Vital Signs Height(in): 68 Pulse(bpm): 54 Weight(lbs): Blood Pressure(mmHg): 111/75 Body Mass Index(BMI): Temperature(F): 98.2 Respiratory Rate(breaths/min): 18 Photos: [  15:No Photos Left, Medial Malleolus] [N/A:N/A N/A] Wound Location: [15:Gradually Appeared] [N/A:N/A] Wounding Event: [15:Venous Leg Ulcer] [N/A:N/A] Primary Etiology: [15:Peripheral Venous Disease,] [N/A:N/A] Comorbid History: [15:Osteoarthritis, Confinement Anxiety 12/30/2019] [N/A:N/A] Date Acquired: [15:9] [N/A:N/A] Weeks of Treatment: [15:Open] [N/A:N/A] Wound Status: [15:5x2.1x1] [N/A:N/A] Measurements L x W x D (cm) [15:8.247] [N/A:N/A] A (cm) : rea [15:8.247] [N/A:N/A] Volume (cm) : [15:29.30%] [N/A:N/A] % Reduction in Area: [15:11.60%] [N/A:N/A] % Reduction in Volume: [15:Full Thickness With Exposed Support N/A] Classification: [15:Structures Large] [N/A:N/A] Exudate Amount: [15:Purulent] [N/A:N/A] Exudate Type: [15:yellow, brown, green] [N/A:N/A] Exudate Color: [15:Flat and Intact] [N/A:N/A] Wound Margin: [15:Large (67-100%)] [N/A:N/A] Granulation Amount: [15:Red, Pink] [N/A:N/A] Granulation Quality: [15:Small (1-33%)] [N/A:N/A] Necrotic Amount: [15:Fat Layer (Subcutaneous Tissue): Yes N/A] Exposed Structures: [15:Tendon: Yes Fascia: No Muscle: No Joint: No Bone: No Small (1-33%)]  [N/A:N/A] Epithelialization: [15:Compression Therapy] [N/A:N/A] Treatment Notes Electronic Signature(s) Signed: 03/20/2020 4:22:08 PM By: Linton Ham MD Signed: 03/20/2020 5:04:07 PM By: Deon Pilling Entered By: Linton Ham on 03/20/2020 09:35:39 -------------------------------------------------------------------------------- Multi-Disciplinary Care Plan Details Patient Name: Date of Service: [name], [name] 03/20/2020 8:30 A M Medical Record Number: [id number] Patient Account Number: [id number] Date of Birth/Sex: Treating RN: [birth year] (64 y.o. [name] Primary Care Elayna Tobler: [name] Other Clinician: Referring Tamarra Geiselman: Treating Andron Marrazzo/Extender: [name] in Treatment: 9 Active Inactive Abuse / Safety / Falls / Self Care Management Nursing Diagnoses: Potential for falls Potential for injury related to falls Goals: Patient will not experience any injury related to falls Date Initiated: 01/17/2020 Target Resolution Date: 04/06/2020 Goal Status: Active Patient/caregiver will verbalize/demonstrate measures taken to prevent injury and/or falls Date Initiated: 01/17/2020 Date Inactivated: 03/12/2020 Target Resolution Date: 03/09/2020 Goal Status: Met Interventions: Assess Activities of Daily Living upon admission and as needed Assess fall risk on admission and as needed Assess: immobility, friction, shearing, incontinence upon admission and as needed Assess impairment of mobility on admission and as needed per policy Assess personal safety and home safety (as indicated) on admission and as needed Assess self care needs on admission and as needed Provide education on fall prevention Provide education on personal and home safety Notes: Wound/Skin Impairment Nursing Diagnoses: Impaired tissue integrity Knowledge deficit related to ulceration/compromised skin integrity Goals: Patient/caregiver will verbalize understanding  of skin care regimen Date Initiated: 01/17/2020 Target Resolution Date: 04/06/2020 Goal Status: Active Ulcer/skin breakdown will have a volume reduction of 30% by week 4 Date Initiated: 01/17/2020 Date Inactivated: 03/12/2020 Target Resolution Date: 03/09/2020 Goal Status: Met Interventions: Assess patient/caregiver ability to obtain necessary supplies Assess patient/caregiver ability to perform ulcer/skin care regimen upon admission and as needed Assess ulceration(s) every visit Provide education on ulcer and skin care Notes: Electronic Signature(s) Signed: 03/20/2020 5:04:07 PM By: Deon Pilling Entered By: Deon Pilling on 03/20/2020 08:39:39 -------------------------------------------------------------------------------- Pain Assessment Details Patient Name: Date of Service: [name], [name] 03/20/2020 8:30 A M Medical Record Number: [id number] Patient Account Number: [id number] Date of Birth/Sex: Treating RN: [birth year] (64 y.o. [name] Primary Care Dezerae Freiberger: [name] Other Clinician: Referring Jalene Demo: Treating Lance Huaracha/Extender: [name] in Treatment: 9 Active Problems Location of Pain Severity and Description of Pain Patient Has Paino Yes Site Locations Rate the pain. Rate the pain. Current Pain Level: 4 Pain Management and Medication Current Pain Management: Electronic Signature(s) Signed: 03/20/2020 1:16:57 PM By: [name] Signed: 03/20/2020 5:04:07 PM By: Deon Pilling Entered By: [name] on 03/20/2020 08:44:01 -------------------------------------------------------------------------------- Patient/Caregiver Education Details Patient Name: Date of Service: [name], [name] Foot  NO [name] 10/19/2021andnbsp8:30 A M Medical Record Number: [id number] Patient Account Number: [id number] Date of Birth/Gender: Treating RN: 04-09-1956 (63 y.o. [name] Primary Care Physician: [name] Other  Clinician: Referring Physician: Treating Physician/Extender: [name] in Treatment: 9 Education Assessment Education Provided To: Patient Education Topics Provided Safety: Handouts: Personal Safety Methods: Explain/Verbal Responses: Reinforcements needed Electronic Signature(s) Signed: 03/20/2020 5:04:07 PM By: Deon Pilling Entered By: Deon Pilling on 03/20/2020 08:39:51 -------------------------------------------------------------------------------- Wound Assessment Details Patient Name: Date of Service: [name], [name] 03/20/2020 8:30 A M Medical Record Number: [id number] Patient Account Number: [id number] Date of Birth/Sex: Treating RN: 08/06/1955 (64 y.o. [name], [name] Primary Care Zaylie Gisler: [name] Other Clinician: Referring Wendell Fiebig: Treating Memorie [place]okoy[place]/Extender: [name] in Treatment: 9 Wound Status Wound Number: 15 Primary Venous Leg Ulcer Etiology: Wound Location: Left, Medial Malleolus Wound Status: Open Wounding Event: Gradually Appeared Comorbid Peripheral Venous Disease, Osteoarthritis, Confinement Date Acquired: 12/30/2019 History: Anxiety Weeks Of Treatment: 9 Clustered Wound: No Wound Measurements Length: (cm) 5 Width: (cm) 2.1 Depth: (cm) 1 Area: (cm) 8.247 Volume: (cm) 8.247 % Reduction in Area: 29.3% % Reduction in Volume: 11.6% Epithelialization: Small (1-33%) Tunneling: No Undermining: No Wound Description Classification: Full Thickness With Exposed Support Structures Wound Margin: Flat and Intact Exudate Amount: Large Exudate Type: Purulent Exudate Color: yellow, brown, green Foul Odor After Cleansing: No Slough/Fibrino Yes Wound Bed Granulation Amount: Large (67-100%) Exposed Structure Granulation Quality: Red, Pink Fascia Exposed: No Necrotic Amount: Small (1-33%) Fat Layer (Subcutaneous Tissue) Exposed: Yes Necrotic Quality: Adherent Slough Tendon Exposed:  Yes Muscle Exposed: No Joint Exposed: No Bone Exposed: No Treatment Notes Wound #15 (Left, Medial Malleolus) 1. Cleanse With Wound Cleanser Soap and water 2. Periwound Care Moisturizing lotion 3. Primary Dressing Applied Calcium Alginate Ag Other primary dressing (specifiy in notes) 4. Secondary Dressing ABD Pad 6. Support Layer Applied Kerlix/[employer] Notes gentamycin ointment applied under the alginate Ag. no kerlix with unna boot patient request. Electronic Signature(s) Signed: 03/20/2020 5:04:07 PM By: Deon Pilling Entered By: Deon Pilling on 03/20/2020 08:58:26 -------------------------------------------------------------------------------- Vitals Details Patient Name: Date of Service: [name], [name] 03/20/2020 8:30 A M Medical Record Number: [id number] Patient Account Number: [id number] Date of Birth/Sex: Treating RN: 07/14/1955 (64 y.o. [name], Meta.Reding Primary Care Casilda Pickerill: [name] Other Clinician: Referring Veryl Winemiller: Treating Kimber Esterly/Extender: [name] in Treatment: 9 Vital Signs Time Taken: 08:43 Temperature (F): 98.2 Height (in): 68 Pulse (bpm): 54 Respiratory Rate (breaths/min): 18 Blood Pressure (mmHg): 111/75 Reference Range: 80 - 120 mg / dl Electronic Signature(s) Signed: 03/20/2020 1:16:57 PM By: [name] Entered By: [name] on 03/20/2020 08:43:49

## 2020-03-20 NOTE — Progress Notes (Signed)
[name], [name] ([id number]) Visit Report for 03/20/2020 HPI Details Patient Name: Date of Service: [facility] [name], [name] 03/20/2020 8:30 A M Medical Record Number: [id number] Patient Account Number: [id number] Date of Birth/Sex: Treating RN: Sep 22, 1955 (64 y.o. [name] Primary Care Provider: [name] Other Clinician: Referring Provider: Treating Provider/Extender: [name] in Treatment: 9 History of Present Illness HPI Description: the remaining wound is over the left medial ankle. Similar wound over the right medial ankle healed largely with use of Apligraf. Most recently we have been using Hydrofera Blue over this wound with considerable improvement. The patient has been extensively worked up in the past for her venous insufficiency and she is not a candidate for antireflux surgery although I have none of the details available currently. 08/24/14; considerable improvement today. About 50% of this wound areas now epithelialized. The base of the wound appears to be healthier granulation.as opposed to last week when she had deteriorated a considerable improvement 08/17/14; unfortunately the wound has regressed somewhat. The areas of epithelialization from the superior aspect are not nearly as healthy as they were last week. The patient thinks her Hydrofera Blue slipped. 09/07/14; unfortunately the area has markedly regressed in the 2 weeks since I've seen this. There is an odor surrounding erythema. The healthy granulation tissue that we had at the base of the wound now is a dusky color. The nurse reports green drainage 09/14/14; the area looks somewhat better than last week. There is less erythema and less drainage. The culture I did did not show any growth. Nevertheless I think it is better to continue the Cipro and doxycycline for a further week. The remaining wound area was debridement. 09/21/14. Wound did not require debridement last week. Still less  erythema and less drainage. She can complete her antibiotics. The areas of epithelialization in the superior aspect of the wound do not look as healthy as they did some weeks ago 10/05/14 continued improvement in the condition of this wound. There is advancing epithelialization. Less aggressive debridement required 10/19/14 continued improvement in the condition and volume of this wound. Less aggressive debridement to the inferior part of this to remove surface slough and fibrinous eschar 11/02/14 no debridement is required. The surface granulation appears healthy although some of her islands of epithelialization seem to have regressed. No evidence of infection 11/16/14; lites surface debridement done of surface eschar. The wound does not look to be unhealthy. No evidence of infection. Unfortunately the patient has had podiatry issues in the right foot and for some reason has redeveloped small surface ulcerations in the medial right ankle. Her original presentation involved wounds in this area 11/23/14 no debridement. The area on the right ankle has enlarged. The left ankle wound appears stable in terms of the surface although there is periwound inflammation. There has been regression in the amount of new skin 11/30/14 no debridement. Both wound areas appear healthy. There was no evidence of infection. The the new area on the right medial ankle has enlarged although that both the surfaces appear to be stable. 12/07/14; Debridement of the right medial ankle wound. No no debridement was done on the left. 12/14/14 no major change in and now bilateral medial ankle wounds. Both of these are very painful but the no overt evidence of infection. She has had previous venous ablation 12/21/14; patient states that her right medial ankle wound is considerably more painful last week than usual. Her left is also somewhat painful. She could not  tolerate debridement. The right medial ankle wound has fibrinous surface  eschar 12/28/14 this is a patient with severe bilateral venous insufficiency ulcers. For a considerable period of time we actually had the one on the right medial ankle healed however this recently opened up again in June. The left medial ankle wound has been a refractory area with some absent flows. We had some success with Hydrofera Blue on this area and it literally closed by 50% however it is recently opened up Foley. Both of these were debridement today of surface eschar. She tolerates this poorly 01/25/15: No change in the status of this. Thick adherent escar. Very poor tolerance of any attempt at debridement. I had healed the right medial malleolus wound for a considerable amount of time and had the left one down to about 50% of the volume although this is totally regressed over the last 48 weeks. Further the right leg has reopened. she is trying to make a appointment with pain and vascular, previous ablations with Dr. Aleda Grana. I do not believe there is an arterial insufficiency issue here 02/01/15 the status of the adherent eschar bilaterally is actually improved. No debridement was done. She did not manage to get vascular studies done 02/08/15 continued debridement of the area was done today. The slough is less adherent and comes off with less pressure. There is no surrounding infection peripheral pulses are intact 02/15/15 selective debridement with a disposable curette. Again the slough is less adherent and comes off with less difficulty. No surrounding infection peripheral pulses are intact. 02/22/15 selective debridement of the right medial ankle wound. Slough comes off with less difficulty. No obvious surrounding infection peripheral pulses are intact I did not debridement the one on the left. Both of these are stable to improved 03/01/15 selective debridement of both wound areas using a curette to. Adherent slough cup soft with less difficulty. No obvious surrounding infection.  The patient tells me that 2 days ago she noted a rash above the right leg wrap. She did not have this on her lower legs when she change this over she arrives with widespread left greater than right almost folliculitis-looking rash which is extremely pruritic. I don't see anything to culture here. There is no rash on the rest of her body. She feels well systemically. 03/08/15; selective debridement of both wounds using a curette. Base of this does not look unhealthy. She had limegreen drainage coming out of the left leg wound and describes a lot of drainage. The rash on her left leg looks improved to. No cultures were done. 03/22/15; patient was not here last week. Basal wounds does not look healthy and there is no surrounding erythema. No drainage. There is still a rash on the left leg that almost looks vasculitic however it is clearly limited to the top of where the wrap would be. 04/05/15; on the right required a surgical debridement of surface eschar and necrotic subcutaneous tissue. I did not debridement the area on the left. These continue to be large open wounds that are not changing that much. We were successful at one point in healing the area on the right, and at the same time the area on the left was roughly half the size of current measurements. I think a lot of the deterioration has to do with the prolonged time the patient is on her feet at work 04/19/15 I attempted-like surface debridement bilaterally she does not tolerate this. She tells me that she was in allergic care yesterday with  extreme pain over her left lateral malleolus/ankle and was told that she has an "sprain" 05/03/15; large bilateral venous insufficiency wounds over the medial malleolus/medial aspect of her ankles. She complains of copious amounts of drainage and his usual large amounts of pain. There is some increasing erythema around the wound on the right extending into the medial aspect of her foot to. historically she  came in with these wounds the right one healed and the left one came down to roughly half its current size however the right one is reopened and the left is expanded. 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 05/10/15 large bilateral venous insufficiency wounds. There is less adherence surface left however the surface culture that I did last week grew pseudomonas therefore bilateral selective debridement score necessary. There is surrounding erythema. The patient describes severe bilateral drainage and a lot of pain in the left ankle. Apparently her podiatrist was were ready to do a cortisone shot 05/17/15; the patient complains of pain and again copious amounts of drainage. 05/24/15; we used Iodo flex last week. Patient notes considerable improvement in wound drainage. Only needed to change this once. 05/31/15; we continued Iodoflex; the base of these large wounds bilaterally is not too bad but there is probably likely a significant bioburden here. I would like to debridement just doesn't tolerate it. 06/06/14 I would like to continue the Iodoflex although she still hasn't managed to obtain supplies. She has bilateral medial malleoli or large wounds which are mostly superficial. Both of them are covered circumferentially with some nonviable fibrinous slough although she tolerates debridement very poorly. She apparently has an appointment for an ablation on the right leg by interventional radiology. 06/14/15; the patient arrives with the wounds and static condition. We attempted a debridement although she does not do well with this secondary to pain. I 07/05/15; wounds are not much smaller however there appears to be a cleaner granulating base. The left has tight fibrinous slough greater than the right. Debridement is tolerated poorly due to pain. Iodoflex is done more for these wounds in any of the multitude of different dressings I have tried on the left  1 and then subsequently the right. 07/12/15; no change in the condition of this wound. I am able to do an aggressive debridement on the right but not the left. She simply cannot tolerate it. We have been using Iodoflex which helps somewhat. It is worthwhile remembering that at one point we healed the right medial ankle wound and the left was about 25% of the current circumference. We have suggested returning to vascular surgery for review of possible further ablations for one reason or another she has not been able to do this. 07/26/15 no major change in the condition of either wound on her medial ankle. I did not attempt to debridement of these. She has been aggressively scrubbing these while she is in the shower at home. She has her supply of Iodoflex which seems to have done more for these wounds then anything I have put on recently. 08/09/15 wound area appears larger although not verified by measurements. Using Iodoflex 09/05/2015 -- she was here for avisit today but had significant problems with the wound and I was asked to see her for a physician opinion. I have summarize that this lady has had surgery on her left lower extremity about 10 years ago where the possible veins stripping was done. She has  had an opinion from interventional radiology around November 2016 where no further sclerotherapy was ordered. The patient works 12 hours a day and stands on a concrete floor with work boots and is unable to get the proper compression she requires and cannot elevate her limbs appropriately at any given time. She has recently grown Pseudomonas from her wound culture but has not started her ciprofloxacin which was called in for her. 09/13/15 this continues to be a difficult situation for this patient. At one point I had this wound down to a 1.5 x 1.5" wound on her left leg. This is deteriorated and the right leg has reopened. She now has substantial wounds on her medial calcaneus, malleoli and into her  lower leg. One on the left has surface eschar but these are far too painful for me to debridement here. She has a vascular surgery appointment next week to see if anything can be done to help here. I think she has had previous ablations several years ago at [facility]. She has no major edema. She tells me that she did not get product last time [facility]] and went for several days without it. She continues to work in work boots 12 hours a day. She cannot get compression/4-layer under her work boots. 09/20/15 no major change. Periwound edema control was not very good. Her point with pain and vascular is next Wednesday the 25th 09/28/15; the patient is seen vascular surgery and is apparently scheduled for repeat duplex ultrasounds of her bilateral lower legs next week. 10/05/15; the patient was seen by Dr. Doren Custard of vascular surgery. He feels that she should have arterial insufficiency excluded as cause/contributed to her nonhealing stage she is therefore booked for an arteriogram. She has apparently monophasic signals in the dorsalis pedis pulses. She also of course has known severe chronic venous insufficiency with previous procedures as noted previously. I had another long discussion with the patient today about her continuing to work 12 hour shifts. I've written her out for 2 months area had concerns about this as her work location is currently undergoing significant turmoil and this may lead to her termination. She is aware of this however I agree with her that she simply cannot continue to stand for 12 hours multiple days a week with the substantial wound areas she has. 10/19/15; the Dr. Doren Custard appointment was largely for an arteriogram which was normal. She does not have an arterial issue. He didn't make a comment about her chronic venous insufficiency for which she has had previous ablations. Presumably it was not felt that anything additional could be done. The patient is now out of work as I  prescribed 2 weeks ago. Her wounds look somewhat less aggravated presumably because of this. I felt I would give debridement another try today 10/25/15; no major change in this patient's wounds. We are struggling to get her product that she can afford into her own home through her insurance. 11/01/15; no major change in the patient's wounds. I have been using silver alginate as the most affordable product. I spoke to Dr. Marla Roe last week with her requested take her to the OR for surgical debridement and placement of ACEL. Dr. Marla Roe told me that she would be willing to do this however [facility] will not cover this, fortunately the patient has [place] healthcare of some variant 11/08/15; no major change in the patient's wounds. She has been completely nonviable surface that this but is in too much pain with any  attempted debridement are clinic. I have arranged for her to see Dr. Marla Roe ham of plastic surgery and this appointment is on Monday. I am hopeful that they will take her to the OR for debridement, possible ACEL ultimately possible skin graft 11/22/15 no major change in the patient's wounds over her bilateral medial calcaneus medial malleolus into the lower legs. Surface on these does not look too bad however on the left there is surrounding erythema and tenderness. This may be cellulitis or could him sleepy tinea. 11/29/15; no major changes in the patient's wounds over her bilateral medial malleolus. There is no infection here and I don't think any additional antibiotics are necessary. There is now plan to move forward. She sees Dr. Marla Roe in a week's time for preparation for operative debridement and ACEL placement I believe on 7/12. She then has a follow-up appointment with Dr. Marla Roe on 7/21 12/28/15; the patient returns today having been taken to the [place] by Dr. Marla Roe 12/12/15 she underwent debridement, intraoperative cultures [which were negative]. She had  placement of a wound VAC. Parent really ACEL was not available to be placed. The wound VAC foam apparently adhered to the wound since then she's been using silver alginate, Xeroform under Ace wraps. She still says there is a lot of drainage and a lot of pain 01/31/16; this is a patient I see monthly. I had referred her to Dr. Marla Roe him of plastic surgery for large wounds on her bilateral medial ankles. She has been to the OR twice once in early July and once in early August. She tells me over the last 3 weeks she has been using the wound VAC with ACEL underneath it. On the right we've simply been using silver alginate. Under Kerlix Coban wraps. 02/28/16; this is a patient I'm currently seeing monthly. She is gone on to have a skin graft over her large venous insufficiency ulcer on the left medial ankle. This was done by Dr. Marla Roe him. The patient is a bit perturbed about why she didn't have one on her right medial ankle wound. She has been using silver alginate to this. 03/06/16; I received a phone call from her plastic surgery Dr. Marla Roe. She expressed some concern about the viability of the skin graft she did on the left medial ankle wound. Asked me to place Endoform on this. She told me she is not planning to do a subsequent skin graft on the right as the left one did not take very well. I had placed Hydrofera Blue on the right 03/13/16; continue to have a reasonably healthy wound on the right medial ankle. Down to 3 mm in terms of size. There is epithelialization here. The area on the left medial ankle is her skin graft site. I suppose the last week this looks somewhat better. She has an open area inferiorly however in the center there appears to be some viable tissue. There is a lot of surface callus and eschar that will eventually need to come off however none of this looked to be infected. Patient states that the is able to keep the dressing on for several days which is an  improvement. 03/20/16 no major change in the circumference of either wound however on the left side the patient was at Dr. Eusebio Friendly office and they did a debridement of left wound. 50% of the wound seems to be epithelialized. I been using Endoform on the left Hydrofera Blue in the right 03/27/16; she arrives today with her wound is not  looking as healthy as they did last week. The area on the right clearly has an adherent surface to this a very similar surface on the left. Unfortunately for this patient this is all too familiar problem. Clearly the Endoform is not working and will need to change that today that has some potential to help this surface. She does not tolerate debridement in this clinic very well. She is changing the dressing wants 04/03/16; patient arrives with the wounds looking somewhat better especially on the right. Dr. Migdalia Dk change the dressing to silver alginate when she saw her on Monday and also sold her some compression socks. The usefulness of the latter is really not clear and woman with severely draining wounds. 04/10/16; the patient is doing a bit of an experiment wearing the compression stockings that Dr. Migdalia Dk provided her to her left leg and the out of legs based dressings that we provided to the right. 05/01/16; the patient is continuing to wear compression stockings Dr. Migdalia Dk provided her on the left that are apparently silver impregnated. She has been using Iodoflex to the right leg wound. Still a moderate amount of drainage, when she leaves here the wraps only last for 4 days. She has to change the stocking on the left leg every night 05/15/16; she is now using compression stockings bilaterally provided by Dr. Marla Roe. She is wearing a nonadherent layer over the wounds so really I don't think there is anything specific being done to this now. She has some reduction on the left wound. The right is stable. I think all healing here is being done without a  specific dressing 06/09/16; patient arrives here today with not much change in the wound certainly in diameter to large circular wounds over the medial aspect of her ankle bilaterally. Under the light of these services are certainly not viable for healing. There is no evidence of surrounding infection. She is wearing compression stockings with some sort of silver impregnation as prescribed by Dr. Marla Roe. She has a follow-up with her tomorrow. 06/30/16; no major change in the size or condition of her wounds. These are still probably covered with a nonviable surface. She is using only her purchase stockings. She did see Dr. Marla Roe who seemed to want to apply Dakin's solution to this I'm not extreme short what value this would be. I would suggest Iodoflex which she still has at home. 07/28/16; I follow Mrs. Lichtenwalner episodically along with Dr. Marla Roe. 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 She has been applying a topical collagen ointment to both wounds with Adaptic. I don't think Dr. Marla Roe is planning to take her back to the OR. 08/19/16; I follow Mrs. Jeneen Rinks on a monthly basis along with Dr. Marla Roe of plastic surgery. 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 I been following her for a number of years. At one point I was able to get the right medial malleolus wound to heal and had the left medial malleolus down to about half its current size however and I had to send her to plastic surgery for an operative debridement. Since then things have been stable to slightly improve the area on the right is slightly better one in the left about the same although there is much less adherent surface than I'm used to with this patient. She is using some form of liquid collagen gel that Dr. Marla Roe provided a Kerlix  cover with the patient's own pressure stockings.  She tells me that she has extreme pain in both ankles and along the lateral aspect of both feet. She has been unable to work for some period of time. She is telling me she is retiring at the beginning of April. She sees Dr. Doran Durand of orthopedics next week 09/22/16; patient has not seen Dr. Marla Roe since the last time she is here. I'm not really sure what she is using to the wounds other than bits and pieces of think she had left over including most recently Hydrofera Blue. She is using juxtalite stockings. She is having difficulty with her husband's recent illness "stroke". She is having to transport him to various doctors appointments. Dr. Marla Roe left her the option of a repeat debridement with ACEL however she has not been able to get the time to follow-up on this. She continues to have a fair amount of drainage out of these wounds with certainly precludes leaving dressings on all week 10/13/16; patient has not seen Dr. Marla Roe since she was last in our clinic. I'm not really sure what she is doing with the wounds, we did try to get her [facility] and I think she is actually using this most of the time. Because of drainage she states she has to change this every second day although this is an improvement from what she used to do. She went to see Dr. Doran Durand who did not think she had a muscular issue with regards to her feet, he referred her to a neurologist and I think the appointment is sometime in June. I changed her back to Iodoflex which she has used in the past but not recently. 11/03/16; the patient has been using Iodoflex although she ran out of this. Still claims that there is a lot of drainage although the wound does not look like this. No surrounding erythema. She has not been back to see Dr. Marla Roe 11/24/16; the patient has been using Iodoflex again but she ran out of it 2 or 3 days ago. There is no major change in the condition of either one of these wounds in fact they are  larger and covered in a thick adherent surface slough/nonviable tissue especially on the left. She does not tolerate mechanical debridement in our clinic. Going back to see Dr. Marla Roe of plastic surgery for an operative debridement would seem reasonable. 12/15/16; the patient has not been back to see Dr. Marla Roe. She is been dealing with a series of illnesses and her husband which of monopolized her time. She is been using Sorbact which we largely supplied. She states the drainage is bad enough that it maximum she can go 2-3 days without changing the dressing 01/12/2017 -- the patient has not been back for about 4 weeks and has not seen Dr. Marla Roe not does she have any appointment pending. 01/23/17; patient has not seen Dr. Marla Roe even though I suggested this previously. She is using Santyl that was suggested last week by Dr. Con Memos this Cost her $16 through her insurance which is indeed surprising 02/12/17; continuing Santyl and the patient is changing this daily. A lot of drainage. She has not been back to see plastic surgery she is using an Ace wrap. Our intake nurse suggested wrap around stockings which would make a good reasonable alternative 02/26/17; patient is been using Santyl and changing this daily due to drainage. She has not been to see plastic surgery she uses in April Ace wrap to control the  edema. She did obtain extremitease stockings but stated that the edema in her leg was to big for these 03/20/17; patient is using Santyl and Anasept. Surfaces looked better today the area on the right is actually measuring a little smaller. She has states she has a lot of pain in her feet and ankles and is asking for a consult to pain control which I'll try to help her with through our case manager. 04/10/17; the patient arrives with better-looking wound surfaces and is slightly smaller wound on the left she is using a combination of Santyl and Anasept. She has an appointment or at least as  started in the pain control center associated with [facility] 05/14/17; this is a patient who I followed for a prolonged period of time. 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 At one point I had this down to a much smaller wound on the left however these reopened and we've never been able to get these to heal. She has been using Santyl and Anasept gel although 2 weeks ago she ran out of the Anasept gel. She has a stable appearance of the wound. She is going to the wound care clinic at [facility]. They wanted do a nerve block/spinal block although she tells me she is reluctant to go forward with that. 05/21/17; this is a patient I have followed for many years. 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 Chronic pain and deformity in her ankles as well. She is been to see plastic surgery as well as orthopedics. Using PolyMem AG most recently/Kerramax/ABDs and 2 layer compression. She has managed to keep this on and she is coming in for a nurse check to change the dressing on Tuesdays, we see her on Fridays 06/05/17; really quite a good looking surface and the area especially on the right medial has contracted in terms of dimensions. Well granulated healthy-looking tissue on both sides. Even with an open curet there is nothing that even feels abnormal here. This is as good as I've seen this in quite some time. We have been using PolyMem AG and bringing her in for a nurse check 06/12/17; really quite good surface on both of these wounds. The right medial has contracted a bit left is not. We've been using PolyMem and AG and she is coming in for a nurse visit 06/19/17; we have been using PolyMem AG and bringing her in for a nurse check. Dimensions of her wounds are not better but the surfaces looked better bilaterally. She complained of bleeding last night and the left wound and increasing pain bilaterally. She states her wound pain is more  neuropathic than just the wounds. There was some suggestion that this was radicular from her pain management doctor in talking to her it is really difficult to sort this out. 06/26/17; using PolyMem and AG and bringing her in for a nurse check as All of this and reasonably stable condition. Certainly not improved. The dimensions on the lateral part of the right leg look better but not really measuring better. The medial aspect on the left is about the same. 07/03/16; we have been using PolyMen AG and bringing her in for a nurse check to change the dressings as the wounds have drainage which precludes once weekly changing. We are using all secondary absorptive dressings.our intake nurse is brought up the idea of using a wound VAC/snap VAC on the wound to help with the drainage to see if this would  result in some contraction. This is not a bad idea. The area on the right medial is actually looking smaller. Both wounds have a reasonable-looking surface. There is no evidence of cellulitis. The edema is well controlled 07/10/17; the patient was denied for a snap VAC by her insurance. The major issue with these wounds continues to be drainage. We are using wicked PolyMem AG and she is coming in for a nurse visit to change this. The wounds are stable to slightly improved. The surface looks vibrant and the area on the right certainly has shrunk in size but very slowly 07/17/17; the patient still has large wounds on her bilateral medial malleoli. Surface of both of these wounds looks better. The dimensions seem to come and go but no consistent improvement. There is no epithelialization. We do not have options for advanced treatment products due to insurance issues. They did not approve of the wound VAC to help control the drainage. More recently we've been using PolyMem and AG wicked to allow drainage through. We have been bringing her in for a nurse visit to change this. We do not have a lot of options for wound  care products and the home again due to insurance issues 07/24/17; the patient's wound actually looks somewhat better today. No drainage measurements are smaller still healthy-looking surface. We used silver collagen under PolyMen started last week. We have been bringing her in for a dressing change 07/31/17; patient's wound surface continued to look better and I think there is visible change in the dimensions of the wound on the right. Rims of epithelialization. We have been using silver collagen under PolyMen and bringing her in for a dressing change. There appears to be less drainage although she is still in need of the dressing change 08/07/17. Patient's wound surface continues to look better on both sides and the area on the right is definitely smaller. We have been using silver collagen and PolyMen. She feels that the drainage has been it has been better. I asked her about her vascular status. She went to see Dr. Aleda Grana at [facility] and had some form of ablation. I don't have much detail on this. I haven't my notes from 2016 that she was not a candidate for any further ablation but I don't have any more information on this. We had referred her to vein and vascular I don't think she ever went. He does not have a history of PAD although I don't have any information on this either. We don't even have ABIs in our record 08/14/17; we've been using silver collagen and PolyMen cover. And putting the patient and compression. She we are bringing her in as a nurse visit to change this because ofarge amount of drainage. We didn't the ABIs in clinic today since they had been done in many moons 1.2 bilaterally. She has been to see vein and vascular however this was at [facility] and she had ablation although I really don't have any information on this all seemed biking get a report. She is also been operatively debrided by plastic surgery and had a cell placed probably 8-12 months ago. This didn't have  a major effect. We've been making some gains with current dressings 08/19/17-She is here in follow-up evaluation for bilateral medial malleoli ulcers. She continues to tolerate debridement very poorly. We will continue with recently changed topical treatment; if no significant improvement may consider switching to Iodosorb/Iodoflex. 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 She has been using silver collagen and PolyMem. I believe she has been used and tried on Iodoflex before. 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 These have been very refractory over time. We have been using silver collagen and PolyMen. She is coming in once a week for a doctor's and once a week for nurse visits. We are actually making some progress 09/18/17; the patient's wounds are smaller especially on the right medial. She arrives today to upset to consider even washing these off with Anasept which I think is been part of the reason this is been closing. We've been using collagen covered in PolyMen otherwise. It is noted that she has a small area of folliculitis on the right medial calf that. As we are wrapping her legs I'll give her a short course of doxycycline to make sure this doesn't amount to anything. She is a long list of complaints today including imbalance, shortness of breath on exertion, inversion of her left ankle. With regards to the latter complaints she is been to see orthopedics and they offered her a tendon release surgery I believe but wanted her wounds to be closed first. I have recommended she go see her primary physician with regards to everything else. 09/25/17; patient's wounds are about the same size. We have made some progress bilaterally although not in recent  weeks. She will not allow me T wash these wounds with Anasept even if she is doing her cell. Wheeze we've been using collagen covered in PolyMen. Last week she had a small area of folliculitis this is now opened into a small wound. She completed 5 days of trimethoprim sulfamethoxazole 10/02/17; unfortunately the area on her left medial ankle is worse with a larger wound area towards the Achilles. The patient complains of a lot of pain. She will not allow debridement although visually I don't think there is anything to debridement in any case. We have been using silver collagen and PolyMen for several months now. Initially we are making some progress although I'm not really seeing that today. We will move back to [facility]. His admittedly this is a bit of a repeat however I'm hoping that his situation is different now. The patient tells me she had her leg on the left give out on her yesterday this is process some pain. 10/09/17; the patient is seen twice a week largely because of drainage issues coming out of the chronic medial bimalleolar wounds that are chronic. Last week the dimensions of the one on the left looks a little larger I changed her to [facility]. She comes in today with a history of terrible pain in the bilateral wound areas. She will not allow debridement. She will not even allow a tissue culture. There is no surrounding erythema no no evidence of cellulitis. We have been putting her Kerlix Coban man. She will not allow more aggressive compression as there was a suggestion to put her in 3 layer wraps. 10/16/17; large wounds on her bilateral medial malleoli. These are chronic. Not much change from last week. The surface looks have healthy but absolutely no epithelialization. A lot of pain little less so of drainage. She will not allow debridement or even washing these off in the vigorous fashion with Anasept. 10/23/17; large wounds on her bilateral malleoli which are  chronic. Some  improvement in terms of size perhaps on the right since last time I saw these. She states that after we increased the 3 layer compression there was some bleeding, when she came in for a nurse visit she did not want 3 layer compression put back on about our nurse managed to convince her. She has known chronic venous visit issues and I'm hoping to get her to tolerate the 3 layer compression. using Hydrofera Blue 10/30/17; absolutely no change in the condition of either wound although we've had some improvement in dimensions on the right.. Attempted to put her in 3 layer compression she didn't tolerated she is back in 2 layer compression. We've been using Hydrofera Blue We looked over her past records. She had venous reflux studies in November 2016. There was no evidence of deep venous reflux on the right. Superficial vein did not show the greater saphenous vein at think this is been previously ablated the small saphenous vein was within normal limits. The left deep venous system showed no DVT the vessels were positive for deep venous reflux in the posterior tibial veins at the ankle. The greater saphenous vein was surgically absent small saphenous vein was within normal limits. She went to vein and vascular at [facility]. I believe she had an ablation on the left greater saphenous vein. I'll update her reflux studies perhaps ever reviewed by vein and vascular. We've made absolutely no progress in these wounds. Will also try to read and TheraSkins through her insurance 11/06/17; W the patient apparently has a 2 week follow-up with vein and vascular I like him to review the whole issue with regards to her previous vascular workup by Dr. Aleda Grana. We've really made no progress on these wounds in many months. She arrives today with less viable looking surface on the left medial ankle wound. This was apparently looking about the same on Tuesday when she was here for nurse visit. 11/13/17; deep tissue  culture I did last time of the left lower leg showed multiple organisms without any predominating. In particular no Staphylococcus or group A strep were isolated. We sent her for venous reflux studies. She's had a previous left greater saphenous vein stripping and I think sclerotherapy of the right greater saphenous vein. She didn't really look at the lesser saphenous vein this both wounds are on the medial aspect. She has reflux in the common femoral vein and popliteal vein and an accessory vein on the right and the common femoral vein and popliteal vein on the left. I'm going to have her go to see vein and vascular just the look over things and see if anything else beside aggressive compression is indicated here. We have not been able to make any progress on these wounds in spite of the fact that the surface of the wounds is never look too bad. 11/20/17; no major change in the condition of the wounds. Patient reports a large amount of drainage. She has a lot of complaints of pain although enlisting her today I wonder if some of this at least his neuropathic rather than secondary to her wounds. She has an appointment with vein and vascular on 12/30/17. The refractory nature of these wounds in my mind at least need vein and vascular to look over the wounds the recent reflux studies we did and her history to see if anything further can be done here. I also note her gait is deteriorated quite a bit. Looks like she has inversion of her  foot on the right. She has a bilateral Trendelenburg gait. I wonder if this is neuropathic or perhaps multilevel radicular. 11/27/17; her wounds actually looks slightly better. Healthy-looking granulation tissue a scant amount of epithelialization. [place] healthcare will not pay for [employer]. They will play for tri layer Oasis and Dermagraft. This is not a diabetic ulcer. We'll try for the tri layer Oasis. She still complains of some drainage. She has a vein and vascular  appointment on 12/30/17 12/04/17; the wounds visually look quite good. Healthy-looking granulation with some degree of epithelialization. We are still waiting for response to our request for trial to try layer Oasis. Her appointment with vascular to review venous and arterial issues isn't sold the end of July 7/31. Not allow debridement or even vigorous cleansing of the wound surface. 12/18/17; slightly smaller especially on the right. 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 We've been using Hydrofera Blue. We still are looking into triple layer Oasis through her insurance 01/08/18 on evaluation today patient's wound actually appears to be showing signs of good improvement at this point in time. She has been tolerating the dressing changes without complication. Fortunately there does not appear to be any evidence of infection at this point in time. We have been utilizing silver nitrate which does seem to be of benefit for her which is also good news. Overall I'm very happy with how things seem to be both regards appearance as well as measurement. Patient did see Dr. Bridgett Larsson for evaluation on 12/30/17. In his assessment he felt that stripping would not likely add much more than chronic compression to the patient's healing process. His recommendation was to follow-up in three months with Dr. Doren Custard if she hasn't healed in order to consider referral back to you and see vascular where she previously was in a trial and was able to get her wound to heal. I'll be see what she feels she when you staying compression and he reiterated this as well. 01/13/18 on evaluation today patient appears to actually be doing very well in regard to her bilateral medial malleolus ulcers. She seems to have tolerated the chemical cauterization with silver nitrate last week she did have some pain through that evening but fortunately states that I'll be see since it seems to be doing better she is overall  pleased with the progress. 01/21/18; really quite a remarkable improvement since I've last seen these wounds. We started using silver nitrate specially on the islands of hyper granulation which for some reason her around the wound circumference. This is really done quite nicely. Primary dressing Hydrofera Blue under 4 layer compression. She seems to be able to hold out without a nurse rewrap. Follow-up in 1 week 01/28/18; we've continued the hydrofera blue but continued with chemical cauterization to the wound area that we started about a month ago for irregular hyper granulation. She is made almost stunning improvement in the overall wound dimensions. I was not really expecting this degree of improvement in these chronic wounds 02/05/18; we continue with Hydrofera Bluebut of also continued the aggressive chemical cauterization with silver nitrate. We made nice progress with the right greater than left wound. 02/12/18. We continued with Hydrofera Blue after aggressive chemical cauterization with silver nitrate. We appear to be making nice progress with both wound areas 02/19/2018; we continue with [facility] after washing the wounds vigorously with Anasept spray and chemical cauterization with silver nitrate. We are making excellent progress. The area on the right's just about closed 02/26/2018.  The area on the left medial ankle had too much necrotic debris today. I used a #5 curette we are able to get most of the soft. I continued with the silver nitrate to the much smaller wound on the right medial ankle she had a new area on her right lower pretibial area which she says was due to a role in her compression 03/05/2018; both wound areas look healthy. Not much change in dimensions from last week. I continue to use silver nitrate and Hydrofera Blue. The patient saw Dr. Doren Custard of vein and vascular. He felt she had venous stasis ulcers. He felt based on her previous arteriogram she should have  adequate circulation for healing. Also she has deep venous reflux but really no significant correctable superficial venous reflux at this time. He felt we should continue with conservative management including leg elevation and compression 04/02/2018; since we last saw this woman about a month ago she had a fall apparently suffered a pelvic fracture. I did not look up the x-ray. Nevertheless because of pain she literally was bedbound for 2 weeks and had home health coming out to change the dressing. Somewhat predictably this is resulted in considerable improvement in both wound areas. The right is just about closed on the medial malleolus and the left is about half the size. 04/16/2018; both her wounds continue to go down in size. Using Hydrofera Blue. 05/07/18; both her wounds appeared to be improving especially on the right where it is almost closed. We are using Hydrofera Blue 05/14/2018; slightly worse this week with larger wounds. Surface on the left medial not quite as good. We have been using Hydrofera Blue 05/21/18; again the wounds are slightly larger. Left medial malleolus slightly larger with eschar around the circumference. We have been using Hydrofera Blue undergoing a wraps for a prolonged period of time. This got a lot better when she was more recumbent due to a fall and a back injury. I change the primary dressing the silver alginate today. She did not tolerate a 4 layer compression previously although I may need to bring this up with her next time 05/28/2018; area on the left medial malleolus again is slightly larger with more drainage. Area on the right is roughly unchanged. She has a small area of folliculitis on the right medial just on the lower calf. This does not look ominous. 06/03/2018 left medial malleolus slightly smaller in a better looking surface. We used silver nitrate on this last time with silver alginate. The area on the right appears slightly smaller 1/10; left medial  malleolus slightly smaller. Small open area on the right. We used silver nitrate and silver alginate as of 2 weeks ago. We continue with the wound and compression. These got a lot better when she was off her feet 1/17; right medial malleolus wound is smaller. The left may be slightly smaller. Both surfaces look somewhat better. 1/24; both wounds are slightly smaller. Using silver alginate under Unna boots 1/31; both wounds appear smaller in fact the area on the right medial is just about closed. Surface eschar. We have been using silver alginate under Unna boots. The patient is less active now spends let much less time on her feet and I think this is contributed to the general improvement in the wound condition 2/7; both wounds appear smaller. I was hopeful the right medial would be closed however there there is still the same small open area. Slight amount of surface eschar on the left the dimensions  are smaller there is eschar but the wound edges appear to be free. We have been using silver alginate under Unna boot's 2/14; both wounds once again measure smaller. Circumferential eschar on the left medial. We have been using silver alginate under Unna boots with gradual improvement 2/21; the area on the right medial malleolus has healed. The area on the left is smaller. We have been using silver alginate and Unna boots. We can discharge wrapping the right leg she has 20/30 stockings at home she will need to protect the scar tissue in this area 2/28; the area on the right medial malleolus remains closed the patient has a compression stocking. The area on the left is smaller. We have been using silver alginate and Unna boots. 3/6 the area on the right medial ankle remains closed. Good edema control noted she is using her own compression stocking. The area on the left medial ankle is smaller. We have been managing this with silver alginate and Unna boots which we will continue today. 3/13; the area on  the right medial ankle remains closed and I'm declaring it healed today. When necessary the left is about the same still a healthy-looking surface but no major change and wound area. No evidence of infection and using silver alginate under unna and generally making considerable improvement 3/27 the area on the right medial ankle remains closed the area on the left is about the same as last week. Certainly not any worse we have been using silver alginate under an Unna boot 4/3; the area on the right medial ankle remains closed per the patient. We did not look at this wound. The wound on the left medial ankle is about the same surface looks healthy we have been using silver alginate under an Unna boot 4/10; area on the right medial ankle remains closed per the patient. We did not look at this wound. The wound on the left medial ankle is slightly larger. The patient complains that the [facility] caused burning pain all week. She also told us that she was a lot more active this week. Changed her back to silver alginate 4/17; right medial ankle still closed per the patient. Left medial ankle is slightly larger. Using silver alginate. She did not tolerate Hydrofera Blue on this area 4/24; right medial ankle remains closed we have not look at this. The left medial ankle continues to get larger today by about a centimeter. We have been using silver alginate under Unna boots. She complains about 4 layer compression as an alternative. She has been up on her feet working on her garden 5/8; right medial ankle remains closed we did not look at this. The left medial ankle has increased in size about 100%. We have been using silver alginate under Unna boots. She noted increased pain this week and was not surprised that the wound is deteriorated 5/15; no major change in SA however much less erythema ( one week of doxy ocellulitis). 5/22-64 year old female returns at 1 week to the clinic for left medial ankle  wound for which we have been using silver alginate under 3 layer compression She was placed on DOXY at last visit - the wound is wider at this visit. She is in 3 layer compression 5/29; change to [facility] last week. I had given her empiric doxycycline 2 weeks ago for a week. She is in 3 layer compression. She complains of a lot of pain and drainage on presentation today. 6/5; using Hydrofera Blue. I  gave her doxycycline recently empirically for erythema and pain around the wound. Believe her cultures showed enterococcus which not would not have been well covered by doxycycline nevertheless the wound looks better and I don't feel specifically that the enterococcus needs to be covered. She has a new what looks like a wrap injury on her lateral left ankle. 6/12; she is using Hydrofera Blue. She has a new area on the left anterior lower tibial area. This was a wrap injury last week. 6/19; the patient is using Hydrofera Blue. She arrived with marked inflammation and erythema around the wound and tenderness. 12/01/18 on evaluation today patient appears to be doing a little bit better based on what I'm hearing from the standpoint of lassos evaluation to this as far as the overall appearance of the wound is concerned. Then sometime substandard she typically sees Dr. Dellia Nims. Nonetheless overall very pleased with the progress that she's made up to this point. No fevers, chills, nausea, or vomiting noted at this time. 7/10; some improvement in the surface area. Aggressively debrided last week apparently. I went ahead with the debridement today although the patient does not tolerate this very well. We have been using Iodoflex. Still a fair amount of drainage 7/17; slightly smaller. Using Iodoflex. 7/24; no change from last week in terms of surface area. We have been using Iodoflex. Surface looks and continues to look somewhat better 7/31; surface area slightly smaller better looking surface. We have been  using Iodoflex. This is under Unna boot compression 8/7-Patient presents at 1 week with Unna boot and Iodoflex, wound appears better 8/14-Patient presents at 1 week with Iodoflex, we use the Unna boot, wound appears to be stable better.Patient is getting Botox treatment for the inversion of the foot for tendon release, Next week 8/21; we are using Iodoflex. Unna boot. The wound is stable in terms of surface area. Under illumination there is some areas of the wound that appear to be either epithelialized or perhaps this is adherent slough at this point I was not really clear. It did not wipe off and I was reluctant to debride this today. 8/28; we are using Iodoflex in an Unna boot. Seems to be making good improvement. 9/4; using Iodoflex and wound is slightly smaller. 9/18; we are using Iodoflex with topical silver nitrate when she is here. The wound continues to be smaller 10/2; patient missed her appointment last week due to GI issues. She left and Iodoflex based dressing on for 2 weeks. Wound is about the same size about the size of a dime on the left medial lower 10/9 we have been using Iodoflex on the medial left ankle wound. She has a new superficial probable wrap injury on the dorsal left ankle 10/16; we have been using Hydrofera Blue since last week. This is on the left medial ankle 10/23; we have been using Hydrofera Blue since 2 weeks ago. This is on the left medial ankle. Dimensions are better 11/6; using Hydrofera Blue. I think the wound is smaller but still not closed. Left medial ankle 11/13; we have been using Hydrofera Blue. Wound is certainly no smaller this week. Also the surface not as good. This is the remanent of a very large area on her left medial ankle. 11/20; using Sorbact since last week. Wound was about the same in terms of size although I was disappointed about the surface debris 12/11; 3-week follow-up. Patient was on vacation. Wound is measuring slightly larger we have  been using Sorbact. 12/18;  wound is about the same size however surface looks better last week after debridement. We have been using Sorbact under compression 1/15 wound is probably twice the size of last time increased in length nonviable surface. We have been using Sorbact. She was running a mild fever and missed her appointment last week 1/22; the wound is come down in size but under illumination still a very adherent debris we have been Hydrofera Blue that I changed her to last week 1/29; dimensions down slightly. We have been using Hydrofera Blue 2/19 dimensions are the same however there is rims of epithelialization under illumination. Therefore more the surface area may be epithelialized 2/26; the patient's wound actually measures smaller. The wound looks healthy. We have been using Hydrofera Blue. I had some thoughts about running Apligraf then I still may do that however this looks so much better this week we will delay that for now 3/5; the wound is small but about the same as last week. We have been using Hydrofera Blue. No debridement is required today. 3/19; the wound is about the size of a dime. Healthy looking wound even under illumination. We have been using Hydrofera Blue. No mechanical debridement is necessary 3/26; not much change from last week although still looks very healthy. We have been using Hydrofera Blue under Unna boots Patient was offered an ankle fusion by podiatry but not until the wound heals with a proceed with this. 4/9; the patient comes in today with her original wound on the medial ankle looking satisfactory however she has some uncontrolled swelling in the middle part of her leg with 2 new open areas superiorly just lateral to the tibia. I think this was probably a wrap issue. She said she felt uncomfortable during the week but did not call in. We have been using Hydrofera Blue 4/16; the wound on the medial ankle is about the same. She has innumerable small  areas superior to this across her mid tibia. I think this is probably folliculitis. She is also been working in the yard doing a lot of sweating 4/30; the patient issue on the upper areas across her mid tibia of all healed. I think this was excessive yard work if I remember. Her wound on the medial ankle is smaller. Some debris on this we have been using Hydrofera Blue under Unna boots 5/7; mid tibia. She has been using Hydrofera Blue under an Unna wrap. She is apparently going for her ankle surgery on June 3 10/28/19-Patient returns to clinic with the ankle wound, we are using Hydrofera Blue under Unna wrap, surgery is scheduled for her left foot for June 3 so she will be back for nurse visit next week READMISSION 01/17/2020 Mrs. Hegwood is a 64 year old woman we have had in this clinic for a long period of time with severe venous hypertension and refractory wounds on her medial lower legs and ankles bilaterally. 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 When she was off her legs for a prolonged period example when she fell and suffered a compression fracture things would heal up quite nicely. She is now retired and we managed to heal up the right medial leg wound. The left one was very tiny last time I saw this although still refractory. 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 It got to the point where this was interfering with her walking and she elected to proceed with a ankle arthrodesis to straighten her her ankle and leave her with a functional outcome for mobilization. The patient was referred to Dr. Doren Custard and really this took some time to arrange. Dr. Doren Custard saw her on 12/07/2019. Once again he verified that she had no arterial issues. She had previously had an angiogram several  years ago. Follow-up ABIs on the left showed an ABI of 1.12 with triphasic waveforms and a TBI of 0.92. She is felt to have chronic deep venous insufficiency but I do not think it was felt that anything could be done from about this from an ablation point of view. At the time Dr. Doren Custard saw this patient the wounds actually look closed via the pictures in his clinic. The patient finally underwent her surgery on 12/15/2019. This went reasonably well and there was a good anatomic outcome. She developed a small distal wound dehiscence on the lateral part of the surgical wound. However more problematically she is developed recurrence of the wound on the medial left ankle. There are actually 2 wounds here one in the distal lower leg and 1 pretty much at the level of the medial malleolus. It is a more distal area that is more problematic. She has been using Hydrofera Blue which started on Friday before this she was simply Ace wrapping. There was a culture done that showed Pseudomonas and she is on ciprofloxacin. A recent CNS on 8/11 was negative. The patient reports some pain but I generally think this is improving. She is using a cam boot completely nonweightbearing using a walker for pivot transfers and a wheelchair 8/24; not much improvement unfortunately she has a surgical wound on the lateral part in the venous insufficiency wound medially. The bottom part of the medial insufficiency wound is still necrotic there is exposed tendon here. We have been using Hydrofera Blue under compression. Her edema control is however better 8/31; patient in for follow-up of his surgical wound on the lateral part of her left leg and chronic venous insufficiency ulcers medially. We put her back in compression last week. She comes in today with a complaint of 3 or 4 days worth of increasing pain. She felt her cam walker was rubbing on the area on the back of her heel. However there is intense erythema seems more likely she  has cellulitis. She had 2 cultures done when she was seeing podiatry in the postop. One of them in late July showed Pseudomonas and she received a course of ciprofloxacin the other was negative on 8/11 she is allergic to penicillin with anaphylactoid complaints of hives oral swelling via information in epic 9/9; when I saw this patient last week she had intense anterior erythema around her wound on the right lateral heel and ankle and also into the right medial heel. Some of this was no doubt drainage and her walker boot however I was convinced she had cellulitis. I gave her Levaquin and Bactrim she is finishing up on this now. She is following up with Dr. [name] he saw her yesterday. He is taken her out of the walking boot of course she is still nonweightbearing. Her x-ray was negative for any worrisome features such as soft tissue air etc. Things are a lot better this week. She has home health. We have been using Hydrofera Blue under an [employer] which she put back on yesterday. I did  not wrap her last week 9/17; her surrounding skin looks a lot better. In fact the area on the left lateral ankle has just a scant amount of eschar. The only remaining wound is the large area on the left medial ankle. Probably about 60% of this is healthy granulation at the surface however she has a significant divot distally. This has adherent debris in it. I been using debridement and silver collagen to try and get this area to fill-in although I do not think we have made much progress this week 9/24; the patient's wound on the left medial ankle looks a lot better. The deeper divot area distally still requires debridement but this is cleaning up quite nicely we have been using silver collagen. The patient is complaining of swelling in her foot and is worried that that is contributing to the nonhealing of the ankle wound. She is also complaining of numbness in her anterior toes 10/4; left medial ankle. The small area  distally still has a divot with necrotic material that I have been debriding away. This has an undermining area. She is approved for Apligraf. She saw Dr. [name] her surgeon on 10/1. I think he declared himself is satisfied with the condition of things. Still nonweightbearing till the end of the month. 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 Her surgical wound is well closed. There is no evidence of infection 10/11; the patient arrived in clinic today with the expectation that we be able to put an Apligraf on this area after debridement however she arrives with a relatively large amount of green drainage on the dressing. The patient states that this started on Friday. She has not been systemically unwell. 10/19; culture I did last week showed both Enterococcus and Pseudomonas. I think this came in separate parts because I stopped her ciprofloxacin I gave her and prescribed her linezolid however now looking at the final culture result this is Pseudomonas which is resistant to quinolones. She has not yet picked up the linezolid apparently phone issues. We are also trying to get a topical antibiotic out of [place] in [place] they can be applied by home health. She is still having green drainage Electronic Signature(s) Signed: 03/20/2020 4:22:08 PM By: Linton Ham MD Entered By: Linton Ham on 03/20/2020 09:38:12 -------------------------------------------------------------------------------- Physical Exam Details Patient Name: Date of Service: [name], [name] 03/20/2020 8:30 A M Medical Record Number: [id number] Patient Account Number: [id number] Date of Birth/Sex: Treating RN: [birth year] (64 y.o. [name] Primary Care Provider: Other Clinician: [name] Referring Provider: Treating Provider/Extender: [name] in Treatment: 9 Constitutional Sitting or standing Blood Pressure is within target range  for patient.. Pulse regular and within target range for patient.Marland Kitchen Respirations regular, non-labored and within target range.. Temperature is normal and within the target range for the patient.Marland Kitchen Appears in no distress. Cardiovascular Pedal pulses are palpable. Notes Wound exam; left medial ankle. The most distal part of this still has debris although in general the surface of the wound looks better. She is completing the ciprofloxacin even though the Pseudomonas is resistant. There is no surrounding erythema. Her edema is largely controlled. Electronic Signature(s) Signed: 03/20/2020 4:22:08 PM By: Linton Ham MD Entered By: Linton Ham on 03/20/2020 09:39:55 -------------------------------------------------------------------------------- Physician Orders Details Patient Name: Date of Service: [name], [name] 03/20/2020 8:30 A M Medical Record Number: [id number] Patient Account Number: [id number] Date of Birth/Sex: Treating RN: 1955-11-01 (64 y.o.  [name], Meta.Reding Primary Care Provider: [name] Other Clinician: Referring Provider: Treating Provider/Extender: [name] in Treatment: 9 Verbal / Phone Orders: No Diagnosis Coding ICD-10 Coding Code Description [phone] Chronic venous hypertension (idiopathic) with ulcer and inflammation of left lower extremity L97.828 Non-pressure chronic ulcer of other part of left lower leg with other specified severity L97.328 Non-pressure chronic ulcer of left ankle with other specified severity Follow-up Appointments Return Appointment in 1 week. Dressing Change Frequency Wound #15 Left,Medial Malleolus Change dressing three times week. - wound clinic to change on Mondays, home health to change on Wednesday and Friday Skin Barriers/Peri-Wound Care Moisturizing lotion - to leg Wound Cleansing May shower with protection. - use cast protector Primary Wound Dressing Wound #15 Left,Medial Malleolus lginate  with Silver - thin layer of Gentamicin on wound bed under alginate today in clinic. Calcium A Other: - Apply [facility] topical antibiotic medication once it arrives. Follow the direction on how to mix and apply per pharmacy directions. Apply under alginate Ag. Once it arrives stop the gentamycin ointment. Patient to bring in [facility] topical antibiotic medication to each wound care encounter. Secondary Dressing Wound #15 Left,Medial Malleolus Dry Gauze ABD pad Edema Control Unna Boot to Left Lower Extremity Avoid standing for long periods of time Elevate legs to the level of the heart or above for 30 minutes daily and/or when sitting, a frequency of: - throughout the day Off-Loading Other: - ensure no pressure to wound areas. Additional Orders / Instructions Other: - take both cipro and linezolid antibiotics as prescribed. Please call [facility] if miss call. [facility] pharmacy will call you concerning the topical antibiotic medication. [facility] for wound care. - [facility] home [facility] to change twice a week Patient Medications llergies: penicillin, doxycycline A Notifications Medication Indication Start End wound infection 03/20/2020 Cipro enterococcus and pseudomonas DOSE oral 500 mg tablet - 1 tablet oral bid for a further 7 days (in addition to linezolid) Electronic Signature(s) Signed: 03/20/2020 9:11:38 AM By: Linton Ham MD Entered By: Linton Ham on 03/20/2020 09:11:36 -------------------------------------------------------------------------------- Problem List Details Patient Name: Date of Service: [name], [name] 03/20/2020 8:30 A M Medical Record Number: [id number] Patient Account Number: [id number] Date of Birth/Sex: Treating RN: [birth year] (64 y.o. [name] Primary Care Provider: [name] Other Clinician: Referring Provider: Treating Provider/Extender: [name] in  Treatment: 9 Active Problems ICD-10 Encounter Code Description Active Date MDM Diagnosis I87.332 Chronic venous hypertension (idiopathic) with ulcer and inflammation of left 01/17/2020 No Yes lower extremity L97.828 Non-pressure chronic ulcer of other part of left lower leg with other specified 01/17/2020 No Yes severity L97.328 Non-pressure chronic ulcer of left ankle with other specified severity 01/17/2020 No Yes Inactive Problems ICD-10 Code Description Active Date Inactive Date L03.116 Cellulitis of left lower limb 01/31/2020 01/31/2020 T81.31XD Disruption of external operation (surgical) wound, not elsewhere classified, subsequent 01/17/2020 01/17/2020 encounter Resolved Problems Electronic Signature(s) Signed: 03/20/2020 4:22:08 PM By: Linton Ham MD Entered By: Linton Ham on 03/20/2020 09:35:31 -------------------------------------------------------------------------------- Progress Note Details Patient Name: Date of Service: [name], [name] 03/20/2020 8:30 A M Medical Record Number: [id number] Patient Account Number: [id number] Date of Birth/Sex: Treating RN: [birth year] (64 y.o. [name] Primary Care Provider: [name] Other Clinician: Referring Provider: Treating Provider/Extender: [name] in Treatment: 9 Subjective History of Present Illness (HPI) the remaining wound is over the left medial ankle. Similar wound over the  right medial ankle healed largely with use of Apligraf. Most recently we have been using Hydrofera Blue over this wound with considerable improvement. The patient has been extensively worked up in the past for her venous insufficiency and she is not a candidate for antireflux surgery although I have none of the details available currently. 08/24/14; considerable improvement today. About 50% of this wound areas now epithelialized. The base of the wound appears to be healthier granulation.as opposed to last  week when she had deteriorated a considerable improvement 08/17/14; unfortunately the wound has regressed somewhat. The areas of epithelialization from the superior aspect are not nearly as healthy as they were last week. The patient thinks her Hydrofera Blue slipped. 09/07/14; unfortunately the area has markedly regressed in the 2 weeks since I've seen this. There is an odor surrounding erythema. The healthy granulation tissue that we had at the base of the wound now is a dusky color. The nurse reports green drainage 09/14/14; the area looks somewhat better than last week. There is less erythema and less drainage. The culture I did did not show any growth. Nevertheless I think it is better to continue the Cipro and doxycycline for a further week. The remaining wound area was debridement. 09/21/14. Wound did not require debridement last week. Still less erythema and less drainage. She can complete her antibiotics. The areas of epithelialization in the superior aspect of the wound do not look as healthy as they did some weeks ago 10/05/14 continued improvement in the condition of this wound. There is advancing epithelialization. Less aggressive debridement required 10/19/14 continued improvement in the condition and volume of this wound. Less aggressive debridement to the inferior part of this to remove surface slough and fibrinous eschar 11/02/14 no debridement is required. The surface granulation appears healthy although some of her islands of epithelialization seem to have regressed. No evidence of infection 11/16/14; lites surface debridement done of surface eschar. The wound does not look to be unhealthy. No evidence of infection. Unfortunately the patient has had podiatry issues in the right foot and for some reason has redeveloped small surface ulcerations in the medial right ankle. Her original presentation involved wounds in this area 11/23/14 no debridement. The area on the right ankle has enlarged.  The left ankle wound appears stable in terms of the surface although there is periwound inflammation. There has been regression in the amount of new skin 11/30/14 no debridement. Both wound areas appear healthy. There was no evidence of infection. The the new area on the right medial ankle has enlarged although that both the surfaces appear to be stable. 12/07/14; Debridement of the right medial ankle wound. No no debridement was done on the left. 12/14/14 no major change in and now bilateral medial ankle wounds. Both of these are very painful but the no overt evidence of infection. She has had previous venous ablation 12/21/14; patient states that her right medial ankle wound is considerably more painful last week than usual. Her left is also somewhat painful. She could not tolerate debridement. The right medial ankle wound has fibrinous surface eschar 12/28/14 this is a patient with severe bilateral venous insufficiency ulcers. For a considerable period of time we actually had the one on the right medial ankle healed however this recently opened up again in June. The left medial ankle wound has been a refractory area with some absent flows. We had some success with Hydrofera Blue on this area and it literally closed by 50% however it is recently  opened up Foley. Both of these were debridement today of surface eschar. She tolerates this poorly 01/25/15: No change in the status of this. Thick adherent escar. Very poor tolerance of any attempt at debridement. I had healed the right medial malleolus wound for a considerable amount of time and had the left one down to about 50% of the volume although this is totally regressed over the last 48 weeks. Further the right leg has reopened. she is trying to make a appointment with pain and vascular, previous ablations with Dr. Aleda Grana. I do not believe there is an arterial insufficiency issue here 02/01/15 the status of the adherent eschar bilaterally is  actually improved. No debridement was done. She did not manage to get vascular studies done 02/08/15 continued debridement of the area was done today. The slough is less adherent and comes off with less pressure. There is no surrounding infection peripheral pulses are intact 02/15/15 selective debridement with a disposable curette. Again the slough is less adherent and comes off with less difficulty. No surrounding infection peripheral pulses are intact. 02/22/15 selective debridement of the right medial ankle wound. Slough comes off with less difficulty. No obvious surrounding infection peripheral pulses are intact I did not debridement the one on the left. Both of these are stable to improved 03/01/15 selective debridement of both wound areas using a curette to. Adherent slough cup soft with less difficulty. No obvious surrounding infection. The patient tells me that 2 days ago she noted a rash above the right leg wrap. She did not have this on her lower legs when she change this over she arrives with widespread left greater than right almost folliculitis-looking rash which is extremely pruritic. I don't see anything to culture here. There is no rash on the rest of her body. She feels well systemically. 03/08/15; selective debridement of both wounds using a curette. Base of this does not look unhealthy. She had limegreen drainage coming out of the left leg wound and describes a lot of drainage. The rash on her left leg looks improved to. No cultures were done. 03/22/15; patient was not here last week. Basal wounds does not look healthy and there is no surrounding erythema. No drainage. There is still a rash on the left leg that almost looks vasculitic however it is clearly limited to the top of where the wrap would be. 04/05/15; on the right required a surgical debridement of surface eschar and necrotic subcutaneous tissue. I did not debridement the area on the left. These continue to be large open wounds  that are not changing that much. We were successful at one point in healing the area on the right, and at the same time the area on the left was roughly half the size of current measurements. I think a lot of the deterioration has to do with the prolonged time the patient is on her feet at work 04/19/15 I attempted-like surface debridement bilaterally she does not tolerate this. She tells me that she was in allergic care yesterday with extreme pain over her left lateral malleolus/ankle and was told that she has an "sprain" 05/03/15; large bilateral venous insufficiency wounds over the medial malleolus/medial aspect of her ankles. She complains of copious amounts of drainage and his usual large amounts of pain. There is some increasing erythema around the wound on the right extending into the medial aspect of her foot to. historically she came in with these wounds the right one healed and the left one came down to  roughly half its current size however the right one is reopened and the left is expanded. 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 05/10/15 large bilateral venous insufficiency wounds. There is less adherence surface left however the surface culture that I did last week grew pseudomonas therefore bilateral selective debridement score necessary. There is surrounding erythema. The patient describes severe bilateral drainage and a lot of pain in the left ankle. Apparently her podiatrist was were ready to do a cortisone shot 05/17/15; the patient complains of pain and again copious amounts of drainage. 05/24/15; we used Iodo flex last week. Patient notes considerable improvement in wound drainage. Only needed to change this once. 05/31/15; we continued Iodoflex; the base of these large wounds bilaterally is not too bad but there is probably likely a significant bioburden here. I would like to debridement just doesn't tolerate it. 06/06/14 I would  like to continue the Iodoflex although she still hasn't managed to obtain supplies. She has bilateral medial malleoli or large wounds which are mostly superficial. Both of them are covered circumferentially with some nonviable fibrinous slough although she tolerates debridement very poorly. She apparently has an appointment for an ablation on the right leg by interventional radiology. 06/14/15; the patient arrives with the wounds and static condition. We attempted a debridement although she does not do well with this secondary to pain. I 07/05/15; wounds are not much smaller however there appears to be a cleaner granulating base. The left has tight fibrinous slough greater than the right. Debridement is tolerated poorly due to pain. Iodoflex is done more for these wounds in any of the multitude of different dressings I have tried on the left 1 and then subsequently the right. 07/12/15; no change in the condition of this wound. I am able to do an aggressive debridement on the right but not the left. She simply cannot tolerate it. We have been using Iodoflex which helps somewhat. It is worthwhile remembering that at one point we healed the right medial ankle wound and the left was about 25% of the current circumference. We have suggested returning to vascular surgery for review of possible further ablations for one reason or another she has not been able to do this. 07/26/15 no major change in the condition of either wound on her medial ankle. I did not attempt to debridement of these. She has been aggressively scrubbing these while she is in the shower at home. She has her supply of Iodoflex which seems to have done more for these wounds then anything I have put on recently. 08/09/15 wound area appears larger although not verified by measurements. Using Iodoflex 09/05/2015 -- she was here for avisit today but had significant problems with the wound and I was asked to see her for a physician opinion. I have  summarize that this lady has had surgery on her left lower extremity about 10 years ago where the possible veins stripping was done. She has had an opinion from interventional radiology around November 2016 where no further sclerotherapy was ordered. The patient works 12 hours a day and stands on a concrete floor with work boots and is unable to get the proper compression she requires and cannot elevate her limbs appropriately at any given time. She has recently grown Pseudomonas from her wound culture but has not started her ciprofloxacin which was called in for her. 09/13/15 this continues to be a difficult situation for  this patient. At one point I had this wound down to a 1.5 x 1.5" wound on her left leg. This is deteriorated and the right leg has reopened. She now has substantial wounds on her medial calcaneus, malleoli and into her lower leg. One on the left has surface eschar but these are far too painful for me to debridement here. She has a vascular surgery appointment next week to see if anything can be done to help here. I think she has had previous ablations several years ago at [facility]. She has no major edema. She tells me that she did not get product last time [facility] Ag] and went for several days without it. She continues to work in work boots 12 hours a day. She cannot get compression/4-layer under her work boots. 09/20/15 no major change. Periwound edema control was not very good. Her point with pain and vascular is next Wednesday the 25th 09/28/15; the patient is seen vascular surgery and is apparently scheduled for repeat duplex ultrasounds of her bilateral lower legs next week. 10/05/15; the patient was seen by Dr. Doren Custard of vascular surgery. He feels that she should have arterial insufficiency excluded as cause/contributed to her nonhealing stage she is therefore booked for an arteriogram. She has apparently monophasic signals in the dorsalis pedis pulses. She also of course  has known severe chronic venous insufficiency with previous procedures as noted previously. I had another long discussion with the patient today about her continuing to work 12 hour shifts. I've written her out for 2 months area had concerns about this as her work location is currently undergoing significant turmoil and this may lead to her termination. She is aware of this however I agree with her that she simply cannot continue to stand for 12 hours multiple days a week with the substantial wound areas she has. 10/19/15; the Dr. Doren Custard appointment was largely for an arteriogram which was normal. She does not have an arterial issue. He didn't make a comment about her chronic venous insufficiency for which she has had previous ablations. Presumably it was not felt that anything additional could be done. The patient is now out of work as I prescribed 2 weeks ago. Her wounds look somewhat less aggravated presumably because of this. I felt I would give debridement another try today 10/25/15; no major change in this patient's wounds. We are struggling to get her product that she can afford into her own home through her insurance. 11/01/15; no major change in the patient's wounds. I have been using silver alginate as the most affordable product. I spoke to Dr. Marla Roe last week with her requested take her to the OR for surgical debridement and placement of ACEL. Dr. Marla Roe told me that she would be willing to do this however [facility] will not cover this, fortunately the patient has [place] healthcare of some variant 11/08/15; no major change in the patient's wounds. She has been completely nonviable surface that this but is in too much pain with any attempted debridement are clinic. I have arranged for her to see Dr. Marla Roe ham of plastic surgery and this appointment is on Monday. I am hopeful that they will take her to the OR for debridement, possible ACEL ultimately possible skin  graft 11/22/15 no major change in the patient's wounds over her bilateral medial calcaneus medial malleolus into the lower legs. Surface on these does not look too bad however on the left there is surrounding erythema and tenderness. This may  be cellulitis or could him sleepy tinea. 11/29/15; no major changes in the patient's wounds over her bilateral medial malleolus. There is no infection here and I don't think any additional antibiotics are necessary. There is now plan to move forward. She sees Dr. Marla Roe in a week's time for preparation for operative debridement and ACEL placement I believe on 7/12. She then has a follow-up appointment with Dr. Marla Roe on 7/21 12/28/15; the patient returns today having been taken to the [facility] by Dr. Marla Roe 12/12/15 she underwent debridement, intraoperative cultures [which were negative]. She had placement of a wound VAC. Parent really ACEL was not available to be placed. The wound VAC foam apparently adhered to the wound since then she's been using silver alginate, Xeroform under Ace wraps. She still says there is a lot of drainage and a lot of pain 01/31/16; this is a patient I see monthly. I had referred her to Dr. Marla Roe him of plastic surgery for large wounds on her bilateral medial ankles. She has been to the OR twice once in early July and once in early August. She tells me over the last 3 weeks she has been using the wound VAC with ACEL underneath it. On the right we've simply been using silver alginate. Under Kerlix Coban wraps. 02/28/16; this is a patient I'm currently seeing monthly. She is gone on to have a skin graft over her large venous insufficiency ulcer on the left medial ankle. This was done by Dr. Marla Roe him. The patient is a bit perturbed about why she didn't have one on her right medial ankle wound. She has been using silver alginate to this. 03/06/16; I received a phone call from her plastic surgery Dr. Marla Roe. She expressed some  concern about the viability of the skin graft she did on the left medial ankle wound. Asked me to place Endoform on this. She told me she is not planning to do a subsequent skin graft on the right as the left one did not take very well. I had placed Hydrofera Blue on the right 03/13/16; continue to have a reasonably healthy wound on the right medial ankle. Down to 3 mm in terms of size. There is epithelialization here. The area on the left medial ankle is her skin graft site. I suppose the last week this looks somewhat better. She has an open area inferiorly however in the center there appears to be some viable tissue. There is a lot of surface callus and eschar that will eventually need to come off however none of this looked to be infected. Patient states that the is able to keep the dressing on for several days which is an improvement. 03/20/16 no major change in the circumference of either wound however on the left side the patient was at Dr. Eusebio Friendly office and they did a debridement of left wound. 50% of the wound seems to be epithelialized. I been using Endoform on the left Hydrofera Blue in the right 03/27/16; she arrives today with her wound is not looking as healthy as they did last week. The area on the right clearly has an adherent surface to this a very similar surface on the left. Unfortunately for this patient this is all too familiar problem. Clearly the Endoform is not working and will need to change that today that has some potential to help this surface. She does not tolerate debridement in this clinic very well. She is changing the dressing wants 04/03/16; patient arrives with the wounds looking somewhat  better especially on the right. Dr. Migdalia Dk change the dressing to silver alginate when she saw her on Monday and also sold her some compression socks. The usefulness of the latter is really not clear and woman with severely draining wounds. 04/10/16; the patient is doing a bit of  an experiment wearing the compression stockings that Dr. Migdalia Dk provided her to her left leg and the out of legs based dressings that we provided to the right. 05/01/16; the patient is continuing to wear compression stockings Dr. Migdalia Dk provided her on the left that are apparently silver impregnated. She has been using Iodoflex to the right leg wound. Still a moderate amount of drainage, when she leaves here the wraps only last for 4 days. She has to change the stocking on the left leg every night 05/15/16; she is now using compression stockings bilaterally provided by Dr. Marla Roe. She is wearing a nonadherent layer over the wounds so really I don't think there is anything specific being done to this now. She has some reduction on the left wound. The right is stable. I think all healing here is being done without a specific dressing 06/09/16; patient arrives here today with not much change in the wound certainly in diameter to large circular wounds over the medial aspect of her ankle bilaterally. Under the light of these services are certainly not viable for healing. There is no evidence of surrounding infection. She is wearing compression stockings with some sort of silver impregnation as prescribed by Dr. Marla Roe. She has a follow-up with her tomorrow. 06/30/16; no major change in the size or condition of her wounds. These are still probably covered with a nonviable surface. She is using only her purchase stockings. She did see Dr. Marla Roe who seemed to want to apply Dakin's solution to this I'm not extreme short what value this would be. I would suggest Iodoflex which she still has at home. 07/28/16; I follow Mrs. Paschal episodically along with Dr. Marla Roe. 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 She has been applying a topical collagen ointment to both wounds with Adaptic. I don't think Dr. Marla Roe is planning to take her  back to the OR. 08/19/16; I follow Mrs. Jeneen Rinks on a monthly basis along with Dr. Marla Roe of plastic surgery. 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 I been following her for a number of years. At one point I was able to get the right medial malleolus wound to heal and had the left medial malleolus down to about half its current size however and I had to send her to plastic surgery for an operative debridement. Since then things have been stable to slightly improve the area on the right is slightly better one in the left about the same although there is much less adherent surface than I'm used to with this patient. She is using some form of liquid collagen gel that Dr. Marla Roe provided a Kerlix cover with the patient's own pressure stockings. She tells me that she has extreme pain in both ankles and along the lateral aspect of both feet. She has been unable to work for some period of time. She is telling me she is retiring at the beginning of April. She sees Dr. Doran Durand of orthopedics next week 09/22/16; patient has not seen Dr. Marla Roe since the last time she is here. I'm not really sure what she is using to the  wounds other than bits and pieces of think she had left over including most recently Hydrofera Blue. She is using juxtalite stockings. She is having difficulty with her husband's recent illness "stroke". She is having to transport him to various doctors appointments. Dr. Marla Roe left her the option of a repeat debridement with ACEL however she has not been able to get the time to follow-up on this. She continues to have a fair amount of drainage out of these wounds with certainly precludes leaving dressings on all week 10/13/16; patient has not seen Dr. Marla Roe since she was last in our clinic. I'm not really sure what she is doing with the wounds, we did try to get her [facility] and I think she is actually  using this most of the time. Because of drainage she states she has to change this every second day although this is an improvement from what she used to do. She went to see Dr. Doran Durand who did not think she had a muscular issue with regards to her feet, he referred her to a neurologist and I think the appointment is sometime in June. I changed her back to Iodoflex which she has used in the past but not recently. 11/03/16; the patient has been using Iodoflex although she ran out of this. Still claims that there is a lot of drainage although the wound does not look like this. No surrounding erythema. She has not been back to see Dr. Marla Roe 11/24/16; the patient has been using Iodoflex again but she ran out of it 2 or 3 days ago. There is no major change in the condition of either one of these wounds in fact they are larger and covered in a thick adherent surface slough/nonviable tissue especially on the left. She does not tolerate mechanical debridement in our clinic. Going back to see Dr. Marla Roe of plastic surgery for an operative debridement would seem reasonable. 12/15/16; the patient has not been back to see Dr. Marla Roe. She is been dealing with a series of illnesses and her husband which of monopolized her time. She is been using Sorbact which we largely supplied. She states the drainage is bad enough that it maximum she can go 2-3 days without changing the dressing 01/12/2017 -- the patient has not been back for about 4 weeks and has not seen Dr. Marla Roe not does she have any appointment pending. 01/23/17; patient has not seen Dr. Marla Roe even though I suggested this previously. She is using Santyl that was suggested last week by Dr. Con Memos this Cost her $16 through her insurance which is indeed surprising 02/12/17; continuing Santyl and the patient is changing this daily. A lot of drainage. She has not been back to see plastic surgery she is using an Ace wrap. Our intake nurse suggested  wrap around stockings which would make a good reasonable alternative 02/26/17; patient is been using Santyl and changing this daily due to drainage. She has not been to see plastic surgery she uses in April Ace wrap to control the edema. She did obtain extremitease stockings but stated that the edema in her leg was to big for these 03/20/17; patient is using Santyl and Anasept. Surfaces looked better today the area on the right is actually measuring a little smaller. She has states she has a lot of pain in her feet and ankles and is asking for a consult to pain control which I'll try to help her with through our case manager. 04/10/17; the patient arrives with better-looking  wound surfaces and is slightly smaller wound on the left she is using a combination of Santyl and Anasept. She has an appointment or at least as started in the pain control center associated with [facility] 05/14/17; this is a patient who I followed for a prolonged period of time. 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 At one point I had this down to a much smaller wound on the left however these reopened and we've never been able to get these to heal. She has been using Santyl and Anasept gel although 2 weeks ago she ran out of the Anasept gel. She has a stable appearance of the wound. She is going to the wound care clinic at [facility]. They wanted do a nerve block/spinal block although she tells me she is reluctant to go forward with that. 05/21/17; this is a patient I have followed for many years. 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 Chronic pain and deformity in her ankles as well. She is been to see plastic surgery as well as orthopedics. Using PolyMem AG most recently/Kerramax/ABDs and 2 layer compression. She has managed to keep this on and she is coming in for a nurse check to change the dressing on Tuesdays, we see her on Fridays 06/05/17; really quite  a good looking surface and the area especially on the right medial has contracted in terms of dimensions. Well granulated healthy-looking tissue on both sides. Even with an open curet there is nothing that even feels abnormal here. This is as good as I've seen this in quite some time. We have been using PolyMem AG and bringing her in for a nurse check 06/12/17; really quite good surface on both of these wounds. The right medial has contracted a bit left is not. We've been using PolyMem and AG and she is coming in for a nurse visit 06/19/17; we have been using PolyMem AG and bringing her in for a nurse check. Dimensions of her wounds are not better but the surfaces looked better bilaterally. She complained of bleeding last night and the left wound and increasing pain bilaterally. She states her wound pain is more neuropathic than just the wounds. There was some suggestion that this was radicular from her pain management doctor in talking to her it is really difficult to sort this out. 06/26/17; using PolyMem and AG and bringing her in for a nurse check as All of this and reasonably stable condition. Certainly not improved. The dimensions on the lateral part of the right leg look better but not really measuring better. The medial aspect on the left is about the same. 07/03/16; we have been using PolyMen AG and bringing her in for a nurse check to change the dressings as the wounds have drainage which precludes once weekly changing. We are using all secondary absorptive dressings.our intake nurse is brought up the idea of using a wound VAC/snap VAC on the wound to help with the drainage to see if this would result in some contraction. This is not a bad idea. The area on the right medial is actually looking smaller. Both wounds have a reasonable-looking surface. There is no evidence of cellulitis. The edema is well controlled 07/10/17; the patient was denied for a snap VAC by her insurance. The major issue with  these wounds continues to be drainage. We are using wicked PolyMem AG and she is coming in for a nurse visit to change this. The wounds are stable  to slightly improved. The surface looks vibrant and the area on the right certainly has shrunk in size but very slowly 07/17/17; the patient still has large wounds on her bilateral medial malleoli. Surface of both of these wounds looks better. The dimensions seem to come and go but no consistent improvement. There is no epithelialization. We do not have options for advanced treatment products due to insurance issues. They did not approve of the wound VAC to help control the drainage. More recently we've been using PolyMem and AG wicked to allow drainage through. We have been bringing her in for a nurse visit to change this. We do not have a lot of options for wound care products and the home again due to insurance issues 07/24/17; the patient's wound actually looks somewhat better today. No drainage measurements are smaller still healthy-looking surface. We used silver collagen under PolyMen started last week. We have been bringing her in for a dressing change 07/31/17; patient's wound surface continued to look better and I think there is visible change in the dimensions of the wound on the right. Rims of epithelialization. We have been using silver collagen under PolyMen and bringing her in for a dressing change. There appears to be less drainage although she is still in need of the dressing change 08/07/17. Patient's wound surface continues to look better on both sides and the area on the right is definitely smaller. We have been using silver collagen and PolyMen. She feels that the drainage has been it has been better. I asked her about her vascular status. She went to see Dr. Aleda Grana at [facility] and had some form of ablation. I don't have much detail on this. I haven't my notes from 2016 that she was not a candidate for any further ablation but I  don't have any more information on this. We had referred her to vein and vascular I don't think she ever went. He does not have a history of PAD although I don't have any information on this either. We don't even have ABIs in our record 08/14/17; we've been using silver collagen and PolyMen cover. And putting the patient and compression. She we are bringing her in as a nurse visit to change this because ofarge amount of drainage. We didn't the ABIs in clinic today since they had been done in many moons 1.2 bilaterally. She has been to see vein and vascular however this was at [facility] and she had ablation although I really don't have any information on this all seemed biking get a report. She is also been operatively debrided by plastic surgery and had a cell placed probably 8-12 months ago. This didn't have a major effect. We've been making some gains with current dressings 08/19/17-She is here in follow-up evaluation for bilateral medial malleoli ulcers. She continues to tolerate debridement very poorly. We will continue with recently changed topical treatment; if no significant improvement may consider switching to Iodosorb/Iodoflex. 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 She has been using silver collagen and PolyMem. I believe she has been used and tried on Iodoflex before. 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 These have been very refractory over time. We have been using silver collagen and PolyMen. She is coming in once a week for  a doctor's and once a week for nurse visits. We are actually making some progress 09/18/17; the patient's wounds are smaller especially on the right medial. She arrives today to upset to consider even washing these off  with Anasept which I think is been part of the reason this is been closing. We've been using collagen covered in PolyMen otherwise. It is noted that she has a small area of folliculitis on the right medial calf that. As we are wrapping her legs I'll give her a short course of doxycycline to make sure this doesn't amount to anything. She is a long list of complaints today including imbalance, shortness of breath on exertion, inversion of her left ankle. With regards to the latter complaints she is been to see orthopedics and they offered her a tendon release surgery I believe but wanted her wounds to be closed first. I have recommended she go see her primary physician with regards to everything else. 09/25/17; patient's wounds are about the same size. We have made some progress bilaterally although not in recent weeks. She will not allow me T wash these wounds with Anasept even if she is doing her cell. Wheeze we've been using collagen covered in PolyMen. Last week she had a small area of folliculitis this is now opened into a small wound. She completed 5 days of trimethoprim sulfamethoxazole 10/02/17; unfortunately the area on her left medial ankle is worse with a larger wound area towards the Achilles. The patient complains of a lot of pain. She will not allow debridement although visually I don't think there is anything to debridement in any case. We have been using silver collagen and PolyMen for several months now. Initially we are making some progress although I'm not really seeing that today. We will move back to [facility]. His admittedly this is a bit of a repeat however I'm hoping that his situation is different now. The patient tells me she had her leg on the left give out on her yesterday this is process some pain. 10/09/17; the patient is seen twice a week largely because of drainage issues coming out of the chronic medial bimalleolar wounds that are chronic. Last week the dimensions of  the one on the left looks a little larger I changed her to [facility]. She comes in today with a history of terrible pain in the bilateral wound areas. She will not allow debridement. She will not even allow a tissue culture. There is no surrounding erythema no no evidence of cellulitis. We have been putting her Kerlix Coban man. She will not allow more aggressive compression as there was a suggestion to put her in 3 layer wraps. 10/16/17; large wounds on her bilateral medial malleoli. These are chronic. Not much change from last week. The surface looks have healthy but absolutely no epithelialization. A lot of pain little less so of drainage. She will not allow debridement or even washing these off in the vigorous fashion with Anasept. 10/23/17; large wounds on her bilateral malleoli which are chronic. Some improvement in terms of size perhaps on the right since last time I saw these. She states that after we increased the 3 layer compression there was some bleeding, when she came in for a nurse visit she did not want 3 layer compression put back on about our nurse managed to convince her. She has known chronic venous visit issues and I'm hoping to get her to tolerate the 3 layer compression. using Hydrofera Blue 10/30/17; absolutely no  change in the condition of either wound although we've had some improvement in dimensions on the right.. Attempted to put her in 3 layer compression she didn't tolerated she is back in 2 layer compression. We've been using Hydrofera Blue We looked over her past records. She had venous reflux studies in November 2016. There was no evidence of deep venous reflux on the right. Superficial vein did not show the greater saphenous vein at think this is been previously ablated the small saphenous vein was within normal limits. The left deep venous system showed no DVT the vessels were positive for deep venous reflux in the posterior tibial veins at the ankle. The greater  saphenous vein was surgically absent small saphenous vein was within normal limits. She went to vein and vascular at [facility]. I believe she had an ablation on the left greater saphenous vein. I'll update her reflux studies perhaps ever reviewed by vein and vascular. We've made absolutely no progress in these wounds. Will also try to read and TheraSkins through her insurance 11/06/17; W the patient apparently has a 2 week follow-up with vein and vascular I like him to review the whole issue with regards to her previous vascular workup by Dr. Aleda Grana. We've really made no progress on these wounds in many months. She arrives today with less viable looking surface on the left medial ankle wound. This was apparently looking about the same on Tuesday when she was here for nurse visit. 11/13/17; deep tissue culture I did last time of the left lower leg showed multiple organisms without any predominating. In particular no Staphylococcus or group A strep were isolated. We sent her for venous reflux studies. She's had a previous left greater saphenous vein stripping and I think sclerotherapy of the right greater saphenous vein. She didn't really look at the lesser saphenous vein this both wounds are on the medial aspect. She has reflux in the common femoral vein and popliteal vein and an accessory vein on the right and the common femoral vein and popliteal vein on the left. I'm going to have her go to see vein and vascular just the look over things and see if anything else beside aggressive compression is indicated here. We have not been able to make any progress on these wounds in spite of the fact that the surface of the wounds is never look too bad. 11/20/17; no major change in the condition of the wounds. Patient reports a large amount of drainage. She has a lot of complaints of pain although enlisting her today I wonder if some of this at least his neuropathic rather than secondary to her wounds.  She has an appointment with vein and vascular on 12/30/17. The refractory nature of these wounds in my mind at least need vein and vascular to look over the wounds the recent reflux studies we did and her history to see if anything further can be done here. I also note her gait is deteriorated quite a bit. 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 She has a bilateral Trendelenburg gait. I wonder if this is neuropathic or perhaps multilevel radicular. 11/27/17; her wounds actually looks slightly better. Healthy-looking granulation tissue a scant amount of epithelialization. [place] healthcare will not pay for [employer]. They will play for tri layer Oasis and Dermagraft. This is not a diabetic ulcer. We'll try for the tri layer Oasis. She still complains of some drainage. She has a vein and vascular appointment on 12/30/17 12/04/17; the  wounds visually look quite good. Healthy-looking granulation with some degree of epithelialization. We are still waiting for response to our request for trial to try layer Oasis. Her appointment with vascular to review venous and arterial issues isn't sold the end of July 7/31. Not allow debridement or even vigorous cleansing of the wound surface. 12/18/17; slightly smaller especially on the right. 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 We've been using Hydrofera Blue. We still are looking into triple layer Oasis through her insurance 01/08/18 on evaluation today patient's wound actually appears to be showing signs of good improvement at this point in time. She has been tolerating the dressing changes without complication. Fortunately there does not appear to be any evidence of infection at this point in time. We have been utilizing silver nitrate which does seem to be of benefit for her which is also good news. Overall I'm very happy with how things seem to be both regards appearance as well as measurement. Patient did see Dr. Bridgett Larsson  for evaluation on 12/30/17. In his assessment he felt that stripping would not likely add much more than chronic compression to the patient's healing process. His recommendation was to follow-up in three months with Dr. Doren Custard if she hasn't healed in order to consider referral back to you and see vascular where she previously was in a trial and was able to get her wound to heal. I'll be see what she feels she when you staying compression and he reiterated this as well. 01/13/18 on evaluation today patient appears to actually be doing very well in regard to her bilateral medial malleolus ulcers. She seems to have tolerated the chemical cauterization with silver nitrate last week she did have some pain through that evening but fortunately states that I'll be see since it seems to be doing better she is overall pleased with the progress. 01/21/18; really quite a remarkable improvement since I've last seen these wounds. We started using silver nitrate specially on the islands of hyper granulation which for some reason her around the wound circumference. This is really done quite nicely. Primary dressing Hydrofera Blue under 4 layer compression. She seems to be able to hold out without a nurse rewrap. Follow-up in 1 week 01/28/18; we've continued the hydrofera blue but continued with chemical cauterization to the wound area that we started about a month ago for irregular hyper granulation. She is made almost stunning improvement in the overall wound dimensions. I was not really expecting this degree of improvement in these chronic wounds 02/05/18; we continue with Hydrofera Bluebut of also continued the aggressive chemical cauterization with silver nitrate. We made nice progress with the right greater than left wound. 02/12/18. We continued with Hydrofera Blue after aggressive chemical cauterization with silver nitrate. We appear to be making nice progress with both wound areas 02/19/2018; we continue with  [facility] after washing the wounds vigorously with Anasept spray and chemical cauterization with silver nitrate. We are making excellent progress. The area on the right's just about closed 02/26/2018. The area on the left medial ankle had too much necrotic debris today. I used a #5 curette we are able to get most of the soft. I continued with the silver nitrate to the much smaller wound on the right medial ankle she had a new area on her right lower pretibial area which she says was due to a role in her compression 03/05/2018; both wound areas look healthy. Not much change in dimensions from last week. I continue  to use silver nitrate and Hydrofera Blue. The patient saw Dr. Doren Custard of vein and vascular. He felt she had venous stasis ulcers. He felt based on her previous arteriogram she should have adequate circulation for healing. Also she has deep venous reflux but really no significant correctable superficial venous reflux at this time. He felt we should continue with conservative management including leg elevation and compression 04/02/2018; since we last saw this woman about a month ago she had a fall apparently suffered a pelvic fracture. I did not look up the x-ray. Nevertheless because of pain she literally was bedbound for 2 weeks and had home health coming out to change the dressing. Somewhat predictably this is resulted in considerable improvement in both wound areas. The right is just about closed on the medial malleolus and the left is about half the size. 04/16/2018; both her wounds continue to go down in size. Using Hydrofera Blue. 05/07/18; both her wounds appeared to be improving especially on the right where it is almost closed. We are using Hydrofera Blue 05/14/2018; slightly worse this week with larger wounds. Surface on the left medial not quite as good. We have been using Hydrofera Blue 05/21/18; again the wounds are slightly larger. Left medial malleolus slightly larger with  eschar around the circumference. We have been using Hydrofera Blue undergoing a wraps for a prolonged period of time. This got a lot better when she was more recumbent due to a fall and a back injury. I change the primary dressing the silver alginate today. She did not tolerate a 4 layer compression previously although I may need to bring this up with her next time 05/28/2018; area on the left medial malleolus again is slightly larger with more drainage. Area on the right is roughly unchanged. She has a small area of folliculitis on the right medial just on the lower calf. This does not look ominous. 06/03/2018 left medial malleolus slightly smaller in a better looking surface. We used silver nitrate on this last time with silver alginate. The area on the right appears slightly smaller 1/10; left medial malleolus slightly smaller. Small open area on the right. We used silver nitrate and silver alginate as of 2 weeks ago. We continue with the wound and compression. These got a lot better when she was off her feet 1/17; right medial malleolus wound is smaller. The left may be slightly smaller. Both surfaces look somewhat better. 1/24; both wounds are slightly smaller. Using silver alginate under Unna boots 1/31; both wounds appear smaller in fact the area on the right medial is just about closed. Surface eschar. We have been using silver alginate under Unna boots. The patient is less active now spends let much less time on her feet and I think this is contributed to the general improvement in the wound condition 2/7; both wounds appear smaller. I was hopeful the right medial would be closed however there there is still the same small open area. Slight amount of surface eschar on the left the dimensions are smaller there is eschar but the wound edges appear to be free. We have been using silver alginate under Unna boot's 2/14; both wounds once again measure smaller. Circumferential eschar on the left  medial. We have been using silver alginate under Unna boots with gradual improvement 2/21; the area on the right medial malleolus has healed. The area on the left is smaller. We have been using silver alginate and Unna boots. We can discharge wrapping the right leg she  has 20/30 stockings at home she will need to protect the scar tissue in this area 2/28; the area on the right medial malleolus remains closed the patient has a compression stocking. The area on the left is smaller. We have been using silver alginate and Unna boots. 3/6 the area on the right medial ankle remains closed. Good edema control noted she is using her own compression stocking. The area on the left medial ankle is smaller. We have been managing this with silver alginate and Unna boots which we will continue today. 3/13; the area on the right medial ankle remains closed and I'm declaring it healed today. When necessary the left is about the same still a healthy-looking surface but no major change and wound area. No evidence of infection and using silver alginate under unna and generally making considerable improvement 3/27 the area on the right medial ankle remains closed the area on the left is about the same as last week. Certainly not any worse we have been using silver alginate under an Unna boot 4/3; the area on the right medial ankle remains closed per the patient. We did not look at this wound. The wound on the left medial ankle is about the same surface looks healthy we have been using silver alginate under an Unna boot 4/10; area on the right medial ankle remains closed per the patient. We did not look at this wound. The wound on the left medial ankle is slightly larger. The patient complains that the [facility] caused burning pain all week. She also told us that she was a lot more active this week. Changed her back to silver alginate 4/17; right medial ankle still closed per the patient. Left medial ankle is  slightly larger. Using silver alginate. She did not tolerate Hydrofera Blue on this area 4/24; right medial ankle remains closed we have not look at this. The left medial ankle continues to get larger today by about a centimeter. We have been using silver alginate under Unna boots. She complains about 4 layer compression as an alternative. She has been up on her feet working on her garden 5/8; right medial ankle remains closed we did not look at this. The left medial ankle has increased in size about 100%. We have been using silver alginate under Unna boots. She noted increased pain this week and was not surprised that the wound is deteriorated 5/15; no major change in SA however much less erythema ( one week of doxy ocellulitis). 5/22-64 year old female returns at 1 week to the clinic for left medial ankle wound for which we have been using silver alginate under 3 layer compression She was placed on DOXY at last visit - the wound is wider at this visit. She is in 3 layer compression 5/29; change to [facility] last week. I had given her empiric doxycycline 2 weeks ago for a week. She is in 3 layer compression. She complains of a lot of pain and drainage on presentation today. 6/5; using Hydrofera Blue. I gave her doxycycline recently empirically for erythema and pain around the wound. Believe her cultures showed enterococcus which not would not have been well covered by doxycycline nevertheless the wound looks better and I don't feel specifically that the enterococcus needs to be covered. She has a new what looks like a wrap injury on her lateral left ankle. 6/12; she is using Hydrofera Blue. She has a new area on the left anterior lower tibial area. This was a wrap injury  last week. 6/19; the patient is using Hydrofera Blue. She arrived with marked inflammation and erythema around the wound and tenderness. 12/01/18 on evaluation today patient appears to be doing a little bit better based on what  I'm hearing from the standpoint of lassos evaluation to this as far as the overall appearance of the wound is concerned. Then sometime substandard she typically sees Dr. Dellia Nims. Nonetheless overall very pleased with the progress that she's made up to this point. No fevers, chills, nausea, or vomiting noted at this time. 7/10; some improvement in the surface area. Aggressively debrided last week apparently. I went ahead with the debridement today although the patient does not tolerate this very well. We have been using Iodoflex. Still a fair amount of drainage 7/17; slightly smaller. Using Iodoflex. 7/24; no change from last week in terms of surface area. We have been using Iodoflex. Surface looks and continues to look somewhat better 7/31; surface area slightly smaller better looking surface. We have been using Iodoflex. This is under Unna boot compression 8/7-Patient presents at 1 week with Unna boot and Iodoflex, wound appears better 8/14-Patient presents at 1 week with Iodoflex, we use the Unna boot, wound appears to be stable better.Patient is getting Botox treatment for the inversion of the foot for tendon release, Next week 8/21; we are using Iodoflex. Unna boot. The wound is stable in terms of surface area. Under illumination there is some areas of the wound that appear to be either epithelialized or perhaps this is adherent slough at this point I was not really clear. It did not wipe off and I was reluctant to debride this today. 8/28; we are using Iodoflex in an Unna boot. Seems to be making good improvement. 9/4; using Iodoflex and wound is slightly smaller. 9/18; we are using Iodoflex with topical silver nitrate when she is here. The wound continues to be smaller 10/2; patient missed her appointment last week due to GI issues. She left and Iodoflex based dressing on for 2 weeks. Wound is about the same size about the size of a dime on the left medial lower 10/9 we have been using  Iodoflex on the medial left ankle wound. She has a new superficial probable wrap injury on the dorsal left ankle 10/16; we have been using Hydrofera Blue since last week. This is on the left medial ankle 10/23; we have been using Hydrofera Blue since 2 weeks ago. This is on the left medial ankle. Dimensions are better 11/6; using Hydrofera Blue. I think the wound is smaller but still not closed. Left medial ankle 11/13; we have been using Hydrofera Blue. Wound is certainly no smaller this week. Also the surface not as good. This is the remanent of a very large area on her left medial ankle. 11/20; using Sorbact since last week. Wound was about the same in terms of size although I was disappointed about the surface debris 12/11; 3-week follow-up. Patient was on vacation. Wound is measuring slightly larger we have been using Sorbact. 12/18; wound is about the same size however surface looks better last week after debridement. We have been using Sorbact under compression 1/15 wound is probably twice the size of last time increased in length nonviable surface. We have been using Sorbact. She was running a mild fever and missed her appointment last week 1/22; the wound is come down in size but under illumination still a very adherent debris we have been Hydrofera Blue that I changed her to last week 1/29;  dimensions down slightly. We have been using Hydrofera Blue 2/19 dimensions are the same however there is rims of epithelialization under illumination. Therefore more the surface area may be epithelialized 2/26; the patient's wound actually measures smaller. The wound looks healthy. We have been using Hydrofera Blue. I had some thoughts about running Apligraf then I still may do that however this looks so much better this week we will delay that for now 3/5; the wound is small but about the same as last week. We have been using Hydrofera Blue. No debridement is required today. 3/19; the wound is about  the size of a dime. Healthy looking wound even under illumination. We have been using Hydrofera Blue. No mechanical debridement is necessary 3/26; not much change from last week although still looks very healthy. We have been using Hydrofera Blue under Unna boots Patient was offered an ankle fusion by podiatry but not until the wound heals with a proceed with this. 4/9; the patient comes in today with her original wound on the medial ankle looking satisfactory however she has some uncontrolled swelling in the middle part of her leg with 2 new open areas superiorly just lateral to the tibia. I think this was probably a wrap issue. She said she felt uncomfortable during the week but did not call in. We have been using Hydrofera Blue 4/16; the wound on the medial ankle is about the same. She has innumerable small areas superior to this across her mid tibia. I think this is probably folliculitis. She is also been working in the yard doing a lot of sweating 4/30; the patient issue on the upper areas across her mid tibia of all healed. I think this was excessive yard work if I remember. Her wound on the medial ankle is smaller. Some debris on this we have been using Hydrofera Blue under Unna boots 5/7; mid tibia. She has been using Hydrofera Blue under an Unna wrap. She is apparently going for her ankle surgery on June 3 10/28/19-Patient returns to clinic with the ankle wound, we are using Hydrofera Blue under Unna wrap, surgery is scheduled for her left foot for June 3 so she will be back for nurse visit next week READMISSION 01/17/2020 Mrs. Laux is a 64 year old woman we have had in this clinic for a long period of time with severe venous hypertension and refractory wounds on her medial lower legs and ankles bilaterally. 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 When she was off  her legs for a prolonged period example when she fell and suffered a compression fracture things would heal up quite nicely. She is now retired and we managed to heal up the right medial leg wound. The left one was very tiny last time I saw this although still refractory. 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 It got to the point where this was interfering with her walking and she elected to proceed with a ankle arthrodesis to straighten her her ankle and leave her with a functional outcome for mobilization. The patient was referred to Dr. Doren Custard and really this took some time to arrange. Dr. Doren Custard saw her on 12/07/2019. Once again he verified that she had no arterial issues. She had previously had an angiogram several years ago. Follow-up ABIs on  the left showed an ABI of 1.12 with triphasic waveforms and a TBI of 0.92. She is felt to have chronic deep venous insufficiency but I do not think it was felt that anything could be done from about this from an ablation point of view. At the time Dr. Doren Custard saw this patient the wounds actually look closed via the pictures in his clinic. The patient finally underwent her surgery on 12/15/2019. This went reasonably well and there was a good anatomic outcome. She developed a small distal wound dehiscence on the lateral part of the surgical wound. However more problematically she is developed recurrence of the wound on the medial left ankle. There are actually 2 wounds here one in the distal lower leg and 1 pretty much at the level of the medial malleolus. It is a more distal area that is more problematic. She has been using Hydrofera Blue which started on Friday before this she was simply Ace wrapping. There was a culture done that showed Pseudomonas and she is on ciprofloxacin. A recent CNS on 8/11 was negative. The patient reports some pain but I generally think this is  improving. She is using a cam boot completely nonweightbearing using a walker for pivot transfers and a wheelchair 8/24; not much improvement unfortunately she has a surgical wound on the lateral part in the venous insufficiency wound medially. The bottom part of the medial insufficiency wound is still necrotic there is exposed tendon here. We have been using Hydrofera Blue under compression. Her edema control is however better 8/31; patient in for follow-up of his surgical wound on the lateral part of her left leg and chronic venous insufficiency ulcers medially. We put her back in compression last week. She comes in today with a complaint of 3 or 4 days worth of increasing pain. She felt her cam walker was rubbing on the area on the back of her heel. However there is intense erythema seems more likely she has cellulitis. She had 2 cultures done when she was seeing podiatry in the postop. One of them in late July showed Pseudomonas and she received a course of ciprofloxacin the other was negative on 8/11 she is allergic to penicillin with anaphylactoid complaints of hives oral swelling via information in epic 9/9; when I saw this patient last week she had intense anterior erythema around her wound on the right lateral heel and ankle and also into the right medial heel. Some of this was no doubt drainage and her walker boot however I was convinced she had cellulitis. I gave her Levaquin and Bactrim she is finishing up on this now. She is following up with Dr. [name] he saw her yesterday. He is taken her out of the walking boot of course she is still nonweightbearing. Her x-ray was negative for any worrisome features such as soft tissue air etc. Things are a lot better this week. She has home health. We have been using Hydrofera Blue under an [employer] which she put back on yesterday. I did not wrap her last week 9/17; her surrounding skin looks a lot better. In fact the area on the left lateral ankle  has just a scant amount of eschar. The only remaining wound is the large area on the left medial ankle. Probably about 60% of this is healthy granulation at the surface however she has a significant divot distally. This has adherent debris in it. I been using debridement and silver collagen to try and get this area to fill-in  although I do not think we have made much progress this week 9/24; the patient's wound on the left medial ankle looks a lot better. The deeper divot area distally still requires debridement but this is cleaning up quite nicely we have been using silver collagen. The patient is complaining of swelling in her foot and is worried that that is contributing to the nonhealing of the ankle wound. She is also complaining of numbness in her anterior toes 10/4; left medial ankle. The small area distally still has a divot with necrotic material that I have been debriding away. This has an undermining area. She is approved for Apligraf. She saw Dr. [name] her surgeon on 10/1. I think he declared himself is satisfied with the condition of things. Still nonweightbearing till the end of the month. 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 Her surgical wound is well closed. There is no evidence of infection 10/11; the patient arrived in clinic today with the expectation that we be able to put an Apligraf on this area after debridement however she arrives with a relatively large amount of green drainage on the dressing. The patient states that this started on Friday. She has not been systemically unwell. 10/19; culture I did last week showed both Enterococcus and Pseudomonas. I think this came in separate parts because I stopped her ciprofloxacin I gave her and prescribed her linezolid however now looking at the final culture result this is Pseudomonas which is resistant to quinolones. She has not yet picked up the linezolid apparently phone issues. We are also  trying to get a topical antibiotic out of [place] in [place] they can be applied by home health. She is still having green drainage Objective Constitutional Sitting or standing Blood Pressure is within target range for patient.. Pulse regular and within target range for patient.Marland Kitchen Respirations regular, non-labored and within target range.. Temperature is normal and within the target range for the patient.Marland Kitchen Appears in no distress. Vitals Time Taken: 8:43 AM, Height: 68 in, Temperature: 98.2 F, Pulse: 54 bpm, Respiratory Rate: 18 breaths/min, Blood Pressure: 111/75 mmHg. Cardiovascular Pedal pulses are palpable. General Notes: Wound exam; left medial ankle. The most distal part of this still has debris although in general the surface of the wound looks better. She is completing the ciprofloxacin even though the Pseudomonas is resistant. There is no surrounding erythema. Her edema is largely controlled. Integumentary (Hair, Skin) Wound #15 status is Open. Original cause of wound was Gradually Appeared. The wound is located on the Left,Medial Malleolus. The wound measures 5cm length x 2.1cm width x 1cm depth; 8.247cm^2 area and 8.247cm^3 volume. There is tendon and Fat Layer (Subcutaneous Tissue) exposed. There is no tunneling or undermining noted. There is a large amount of purulent drainage noted. The wound margin is flat and intact. There is large (67-100%) red, pink granulation within the wound bed. There is a small (1-33%) amount of necrotic tissue within the wound bed including Adherent Slough. Assessment Active Problems ICD-10 Chronic venous hypertension (idiopathic) with ulcer and inflammation of left lower extremity Non-pressure chronic ulcer of other part of left lower leg with other specified severity Non-pressure chronic ulcer of left ankle with other specified severity Procedures Wound #15 Pre-procedure diagnosis of Wound #15 is a Venous Leg Ulcer located on the  Left,Medial Malleolus . There was a Haematologist Compression Therapy Procedure by Kela Millin, RN. Post procedure Diagnosis Wound #15: Same as Pre-Procedure Plan Follow-up Appointments: Return Appointment in  1 week. Dressing Change Frequency: Wound #15 Left,Medial Malleolus: Change dressing three times week. - wound clinic to change on Mondays, home health to change on Wednesday and Friday Skin Barriers/Peri-Wound Care: Moisturizing lotion - to leg Wound Cleansing: May shower with protection. - use cast protector Primary Wound Dressing: Wound #15 Left,Medial Malleolus: Calcium Alginate with Silver - thin layer of Gentamicin on wound bed under alginate today in clinic. Other: - Apply [facility] topical antibiotic medication once it arrives. Follow the direction on how to mix and apply per pharmacy directions. Apply under alginate Ag. Once it arrives stop the gentamycin ointment. Patient to bring in [facility] topical antibiotic medication to each wound care encounter. Secondary Dressing: Wound #15 Left,Medial Malleolus: Dry Gauze ABD pad Edema Control: Unna Boot to Left Lower Extremity Avoid standing for long periods of time Elevate legs to the level of the heart or above for 30 minutes daily and/or when sitting, a frequency of: - throughout the day Off-Loading: Other: - ensure no pressure to wound areas. Additional Orders / Instructions: Other: - take both cipro and linezolid antibiotics as prescribed. Please call [facility] if miss call. [facility] pharmacy will call you concerning the topical antibiotic medication. Home Health: [facility] for wound care. - [facility] home [facility] to change twice a week The following medication(s) was prescribed: Cipro oral 500 mg tablet 1 tablet oral bid for a further 7 days (in addition to linezolid) for wound infection enterococcus and pseudomonas starting 03/20/2020 1. I am going to give her a week of linezolid. 2.  I may consider a third-generation cephalosporin if she continues with the green drainage 3. Topical gentamicin, hopefully a topical antibiotic specially prepared. I am going to try to treat the Pseudomonas topically at this point there is no reason to believe she has a spreading infection. 4. Still in her wound improved, she has home health changing this Electronic Signature(s) Signed: 03/20/2020 4:22:08 PM By: Linton Ham MD Entered By: Linton Ham on 03/20/2020 09:41:04 -------------------------------------------------------------------------------- SuperBill Details Patient Name: Date of Service: [name], [name] 03/20/2020 Medical Record Number: [id number] Patient Account Number: [id number] Date of Birth/Sex: Treating RN: [id number]-Nov-1955 (64 y.o. [name], [name] Primary Care Provider: [name] Other Clinician: Referring Provider: Treating Provider/Extender: [name] in Treatment: 9 Diagnosis Coding ICD-10 Codes Code Description [phone] Chronic venous hypertension (idiopathic) with ulcer and inflammation of left lower extremity L97.828 Non-pressure chronic ulcer of other part of left lower leg with other specified severity L97.328 Non-pressure chronic ulcer of left ankle with other specified severity Physician Procedures : CPT4 Code Description Modifier [id number] - WC PHYS LEVEL 3 - EST PT ICD-10 Diagnosis Description L97.828 Non-pressure chronic ulcer of other part of left lower leg with other specified severity L97.328 Non-pressure chronic ulcer of left ankle with  other specified severity I87.332 Chronic venous hypertension (idiopathic) with ulcer and inflammation of left lower extremity Quantity: 1 Electronic Signature(s) Signed: 03/20/2020 4:22:08 PM By: Linton Ham MD Entered By: Linton Ham on 03/20/2020 09:41:28

## 2020-03-27 ENCOUNTER — Encounter (HOSPITAL_BASED_OUTPATIENT_CLINIC_OR_DEPARTMENT_OTHER): Payer: Medicare Other | Admitting: Internal Medicine

## 2020-03-27 ENCOUNTER — Other Ambulatory Visit: Payer: Self-pay

## 2020-03-27 DIAGNOSIS — I87332 Chronic venous hypertension (idiopathic) with ulcer and inflammation of left lower extremity: Secondary | ICD-10-CM | POA: Diagnosis not present

## 2020-03-29 NOTE — Progress Notes (Signed)
[name], [name] ([id number]) Visit Report for 03/27/2020 Arrival Information Details Patient Name: Date of Service: [facility] [name], [name] 03/27/2020 9:00 A M Medical Record Number: [id number] Patient Account Number: [id number] Date of Birth/Sex: Treating RN: 10/08/1955 (64 y.o. [name], [name] Primary Care Mackinley Cassaday: [name] Other Clinician: Referring Jailine Lieder: Treating Cloyce Blankenhorn/Extender: [name] in Treatment: 10 Visit Information History Since Last Visit Added or deleted any medications: No Patient Arrived: [name] Any new allergies or adverse reactions: No Arrival Time: 09:09 Had a fall or experienced change in No Accompanied By: self activities of daily living that may affect Transfer Assistance: None risk of falls: Patient Identification Verified: Yes Signs or symptoms of abuse/neglect since last visito No Secondary Verification Process Completed: Yes Hospitalized since last visit: No Patient Requires Transmission-Based Precautions: No Implantable device outside of the clinic excluding No Patient Has Alerts: Yes cellular tissue based products placed in the center Patient Alerts: L ABI =1.12, TBI = .92 since last visit: R ABI= 1.02, TBI= .58 Has Dressing in Place as Prescribed: Yes Pain Present Now: Yes Electronic Signature(s) Signed: 03/27/2020 3:39:47 PM By: [name] Entered By: [name] on 03/27/2020 09:09:38 -------------------------------------------------------------------------------- Compression Therapy Details Patient Name: Date of Service: [name], [name] 03/27/2020 9:00 A M Medical Record Number: [id number] Patient Account Number: [id number] Date of Birth/Sex: Treating RN: 04-08-1956 (64 y.o. [name] Primary Care Jazae Gandolfi: [name] Other Clinician: Referring Zhuri Krass: Treating Sincerity Cedar/Extender: [name] in Treatment: 10 Compression Therapy Performed for Wound  Assessment: Wound #15 Left,Medial Malleolus Performed By: Clinician Deon Pilling, RN Compression Type: Rolena Infante Post Procedure Diagnosis Same as Pre-procedure Electronic Signature(s) Signed: 03/29/2020 5:24:43 PM By: Deon Pilling Entered By: Deon Pilling on 03/27/2020 09:32:19 -------------------------------------------------------------------------------- Encounter Discharge Information Details Patient Name: Date of Service: [name], [name] 03/27/2020 9:00 A M Medical Record Number: [id number] Patient Account Number: [id number] Date of Birth/Sex: Treating RN: Feb 15, 1956 (64 y.o. [name] Primary Care Laine Fonner: [name] Other Clinician: Referring Shaquan Missey: Treating Kuper Rennels/Extender: [name] in Treatment: 10 Encounter Discharge Information Items Post Procedure Vitals Discharge Condition: Stable Temperature (F): 98.0 Ambulatory Status: Walker Pulse (bpm): 67 Discharge Destination: Home Respiratory Rate (breaths/min): 18 Transportation: Private Auto Blood Pressure (mmHg): 123/76 Accompanied By: alone Schedule Follow-up Appointment: Yes Clinical Summary of Care: Patient Declined Electronic Signature(s) Signed: 03/27/2020 5:15:49 PM By: Levan Hurst RN, BSN Entered By: Levan Hurst on 03/27/2020 10:56:32 -------------------------------------------------------------------------------- Lower Extremity Assessment Details Patient Name: Date of Service: [name], [name] 03/27/2020 9:00 A M Medical Record Number: [id number] Patient Account Number: [id number] Date of Birth/Sex: Treating RN: 1955-10-09 (64 y.o. [name] Primary Care Young Mulvey: [name] Other Clinician: Referring Xinyi Batton: Treating Mylene Bow/Extender: [name] in Treatment: 10 Edema Assessment Assessed: [name]: Yes] [Right: No] Edema: [Left: Ye] [Right: s] Calf Left: Right: Point of Measurement: From Medial  Instep 30 cm Ankle Left: Right: Point of Measurement: From Medial Instep 26 cm Electronic Signature(s) Signed: 03/29/2020 5:24:43 PM By: Deon Pilling Entered By: Deon Pilling on 03/27/2020 09:30:58 -------------------------------------------------------------------------------- Multi Wound Chart Details Patient Name: Date of Service: [name], [name] 03/27/2020 9:00 A M Medical Record Number: [id number] Patient Account Number: [id number] Date of Birth/Sex: Treating RN: 20-Jun-1955 (64 y.o. [name] Primary Care Baily Hovanec: [name] Other Clinician: Referring Syble Picco: Treating Caralee Morea/Extender: [name] in Treatment: 10 Vital Signs Height(in): 68 Pulse(bpm): 67 Weight(lbs):  Blood Pressure(mmHg): 123/76 Body Mass Index(BMI): Temperature(F): 98.0 Respiratory Rate(breaths/min): 18 Photos: [15:No Photos Left, Medial Malleolus] [N/A:N/A N/A] Wound Location: [15:Gradually Appeared] [N/A:N/A] Wounding Event: [15:Venous Leg Ulcer] [N/A:N/A] Primary Etiology: [15:Peripheral Venous Disease,] [N/A:N/A] Comorbid History: [15:Osteoarthritis, Confinement Anxiety 12/30/2019] [N/A:N/A] Date Acquired: [15:10] [N/A:N/A] Weeks of Treatment: [15:Open] [N/A:N/A] Wound Status: [15:4.7x1.6x0.7] [N/A:N/A] Measurements L x W x D (cm) [38:7.564] [N/A:N/A] A (cm) : rea [15:4.134] [N/A:N/A] Volume (cm) : [15:49.40%] [N/A:N/A] % Reduction in A [15:rea: 55.70%] [N/A:N/A] % Reduction in Volume: [15:Full Thickness With Exposed Support N/A] Classification: [15:Structures Medium] [N/A:N/A] Exudate A mount: [15:Purulent] [N/A:N/A] Exudate Type: [15:yellow, brown, green] [N/A:N/A] Exudate Color: [15:Flat and Intact] [N/A:N/A] Wound Margin: [15:Large (67-100%)] [N/A:N/A] Granulation A mount: [15:Red, Pink] [N/A:N/A] Granulation Quality: [15:Small (1-33%)] [N/A:N/A] Necrotic A mount: [15:Fat Layer (Subcutaneous Tissue): Yes N/A] Exposed  Structures: [15:Tendon: Yes Fascia: No Muscle: No Joint: No Bone: No Small (1-33%)] [N/A:N/A] Epithelialization: [15:Debridement - Excisional] [N/A:N/A] Debridement: Pre-procedure Verification/Time Out 09:35 [N/A:N/A] Taken: [15:Lidocaine 5% topical ointment] [N/A:N/A] Pain Control: [15:Subcutaneous] [N/A:N/A] Tissue Debrided: [15:Skin/Subcutaneous Tissue] [N/A:N/A] Level: [15:7.52] [N/A:N/A] Debridement A (sq cm): [15:rea Curette] [N/A:N/A] Instrument: [15:Minimum] [N/A:N/A] Bleeding: [15:Pressure] [N/A:N/A] Hemostasis A chieved: [15:0] [N/A:N/A] Procedural Pain: [15:0] [N/A:N/A] Post Procedural Pain: [15:Procedure was tolerated well] [N/A:N/A] Debridement Treatment Response: [15:4.7x1.6x0.7] [N/A:N/A] Post Debridement Measurements L x W x D (cm) [15:4.134] [N/A:N/A] Post Debridement Volume: (cm) [15:Compression Therapy] [N/A:N/A] Procedures Performed: [15:Debridement] Treatment Notes Electronic Signature(s) Signed: 03/27/2020 4:47:47 PM By: Linton Ham MD Signed: 03/29/2020 5:24:43 PM By: Deon Pilling Entered By: Linton Ham on 03/27/2020 09:46:45 -------------------------------------------------------------------------------- Multi-Disciplinary Care Plan Details Patient Name: Date of Service: [name], [name] 03/27/2020 9:00 A M Medical Record Number: [id number] Patient Account Number: [id number] Date of Birth/Sex: Treating RN: 08-04-1955 (64 y.o. [name] Primary Care Provider: [name] Other Clinician: Referring Provider: Treating Provider/Extender: [name] in Treatment: 10 Active Inactive Abuse / Safety / Falls / Self Care Management Nursing Diagnoses: Potential for falls Potential for injury related to falls Goals: Patient will not experience any injury related to falls Date Initiated: 01/17/2020 Target Resolution Date: 04/06/2020 Goal Status: Active Patient/caregiver will verbalize/demonstrate measures taken  to prevent injury and/or falls Date Initiated: 01/17/2020 Date Inactivated: 03/12/2020 Target Resolution Date: 03/09/2020 Goal Status: Met Interventions: Assess Activities of Daily Living upon admission and as needed Assess fall risk on admission and as needed Assess: immobility, friction, shearing, incontinence upon admission and as needed Assess impairment of mobility on admission and as needed per policy Assess personal safety and home safety (as indicated) on admission and as needed Assess self care needs on admission and as needed Provide education on fall prevention Provide education on personal and home safety Notes: Wound/Skin Impairment Nursing Diagnoses: Impaired tissue integrity Knowledge deficit related to ulceration/compromised skin integrity Goals: Patient/caregiver will verbalize understanding of skin care regimen Date Initiated: 01/17/2020 Target Resolution Date: 04/06/2020 Goal Status: Active Ulcer/skin breakdown will have a volume reduction of 30% by week 4 Date Initiated: 01/17/2020 Date Inactivated: 03/12/2020 Target Resolution Date: 03/09/2020 Goal Status: Met Interventions: Assess patient/caregiver ability to obtain necessary supplies Assess patient/caregiver ability to perform ulcer/skin care regimen upon admission and as needed Assess ulceration(s) every visit Provide education on ulcer and skin care Notes: Electronic Signature(s) Signed: 03/29/2020 5:24:43 PM By: Deon Pilling Entered By: Deon Pilling on 03/27/2020 09:31:43 -------------------------------------------------------------------------------- Pain Assessment Details Patient Name: Date of Service: [name], [name] 03/27/2020 9:00 A M Medical Record Number: [id number] Patient Account Number: [id number] Date  of Birth/Sex: Treating RN: August 31, 1955 (64 y.o. [name] Primary Care Xariah Silvernail: [name] Other Clinician: Referring Hind Chesler: Treating Brandilee Pies/Extender: [name] in Treatment: 10 Active Problems Location of Pain Severity and Description of Pain Patient Has Paino No Site Locations Pain Management and Medication Current Pain Management: Electronic Signature(s) Signed: 03/27/2020 3:39:47 PM By: [name] Signed: 03/29/2020 5:24:43 PM By: Deon Pilling Entered By: [name] on 03/27/2020 09:10:09 -------------------------------------------------------------------------------- Patient/Caregiver Education Details Patient Name: Date of Service: [name], [name] 10/26/2021andnbsp9:00 A M Medical Record Number: [id number] Patient Account Number: [id number] Date of Birth/Gender: Treating RN: 01/28/1956 (64 y.o. [name] Primary Care Physician: [name] Other Clinician: Referring Physician: Treating Physician/Extender: [name] in Treatment: 10 Education Assessment Education Provided To: Patient Education Topics Provided Safety: Handouts: Safety Methods: Explain/Verbal Responses: Reinforcements needed Electronic Signature(s) Signed: 03/29/2020 5:24:43 PM By: Deon Pilling Entered By: Deon Pilling on 03/27/2020 09:31:57 -------------------------------------------------------------------------------- Wound Assessment Details Patient Name: Date of Service: [name], [name] 03/27/2020 9:00 A M Medical Record Number: [id number] Patient Account Number: [id number] Date of Birth/Sex: Treating RN: [birth year] (64 y.o. [name], [name] Primary Care Genavive Kubicki: [name] Other Clinician: Referring Warnie Belair: Treating Emori Kamau/Extender: [name] in Treatment: 10 Wound Status Wound Number: 15 Primary Venous Leg Ulcer Etiology: Wound Location: Left, Medial Malleolus Wound Status: Open Wounding Event: Gradually Appeared Comorbid Peripheral Venous Disease, Osteoarthritis, Confinement Date Acquired: 12/30/2019 History:  Anxiety Weeks Of Treatment: 10 Clustered Wound: No Photos Photo Uploaded By: Mikeal Hawthorne on 03/28/2020 11:29:16 Wound Measurements Length: (cm) 4.7 Width: (cm) 1.6 Depth: (cm) 0.7 Area: (cm) 5.906 Volume: (cm) 4.134 % Reduction in Area: 49.4% % Reduction in Volume: 55.7% Epithelialization: Small (1-33%) Tunneling: No Undermining: No Wound Description Classification: Full Thickness With Exposed Support Structures Wound Margin: Flat and Intact Exudate Amount: Medium Exudate Type: Purulent Exudate Color: yellow, brown, green Foul Odor After Cleansing: No Slough/Fibrino Yes Wound Bed Granulation Amount: Large (67-100%) Exposed Structure Granulation Quality: Red, Pink Fascia Exposed: No Necrotic Amount: Small (1-33%) Fat Layer (Subcutaneous Tissue) Exposed: Yes Necrotic Quality: Adherent Slough Tendon Exposed: Yes Muscle Exposed: No Joint Exposed: No Bone Exposed: No Treatment Notes Wound #15 (Left, Medial Malleolus) 1. Cleanse With Soap and water 2. Periwound Care Moisturizing lotion 3. Primary Dressing Applied Calcium Alginate Ag 4. Secondary Dressing ABD Pad Dry Gauze 6. Support Layer [employer] Notes antibiotic ointment applied under the alginate Ag Electronic Signature(s) Signed: 03/29/2020 5:24:43 PM By: Deon Pilling Entered By: Deon Pilling on 03/27/2020 09:30:42 -------------------------------------------------------------------------------- Vitals Details Patient Name: Date of Service: [name], [name] 03/27/2020 9:00 A M Medical Record Number: [id number] Patient Account Number: [id number] Date of Birth/Sex: Treating RN: [name], 1956 (64 y.o. [name], Meta.Reding Primary Care Genise Strack: [name] Other Clinician: Referring Bindu Docter: Treating Mackey Varricchio/Extender: [name] in Treatment: 10 Vital Signs Time Taken: 09:09 Temperature (F): 98.0 Height (in): 68 Pulse (bpm): 67 Respiratory Rate (breaths/min):  18 Blood Pressure (mmHg): 123/76 Reference Range: 80 - 120 mg / dl Electronic Signature(s) Signed: 03/27/2020 3:39:47 PM By: [name] Entered By: [name] on 03/27/2020 09:10:02

## 2020-03-29 NOTE — Progress Notes (Signed)
RYLEN, HOU ([id number]) Visit Report for 03/27/2020 Debridement Details Patient Name: Date of Service: [facility], [facility], [facility] [name], [name] 03/27/2020 9:00 A M Medical Record Number: [id number] Patient Account Number: [id number] Date of Birth/Sex: Treating RN: 1955/12/10 (64 y.o. [name], [name] Primary Care Provider: [name] Other Clinician: Referring Provider: Treating Provider/Extender: [name] in Treatment: 10 Debridement Performed for Assessment: Wound #15 Left,Medial Malleolus Performed By: Physician [name]., MD Debridement Type: Debridement Severity of Tissue Pre Debridement: Fat layer exposed Level of Consciousness (Pre-procedure): Awake and Alert Pre-procedure Verification/Time Out Yes - 09:35 Taken: Start Time: 09:36 Pain Control: Lidocaine 5% topical ointment T Area Debrided (L x W): otal 4.7 (cm) x 1.6 (cm) = 7.52 (cm) Tissue and other material debrided: Viable, Non-Viable, Subcutaneous, Skin: Dermis , Fibrin/Exudate Level: Skin/Subcutaneous Tissue Debridement Description: Excisional Instrument: Curette Bleeding: Minimum Hemostasis Achieved: Pressure End Time: 09:40 Procedural Pain: 0 Post Procedural Pain: 0 Response to Treatment: Procedure was tolerated well Level of Consciousness (Post- Awake and Alert procedure): Post Debridement Measurements of Total Wound Length: (cm) 4.7 Width: (cm) 1.6 Depth: (cm) 0.7 Volume: (cm) 4.134 Character of Wound/Ulcer Post Debridement: Improved Severity of Tissue Post Debridement: Fat layer exposed Post Procedure Diagnosis Same as Pre-procedure Electronic Signature(s) Signed: 03/27/2020 4:47:47 PM By: Linton Ham MD Signed: 03/29/2020 5:24:43 PM By: Deon Pilling Entered By: Linton Ham on 03/27/2020 09:46:58 -------------------------------------------------------------------------------- HPI Details Patient Name: Date of Service: [name], [name] 03/27/2020 9:00 A  M Medical Record Number: [id number] Patient Account Number: [id number] Date of Birth/Sex: Treating RN: [birth year] (64 y.o. [name] Primary Care Provider: [name] Other Clinician: Referring Provider: Treating Provider/Extender: [name] in Treatment: 10 History of Present Illness HPI Description: the remaining wound is over the left medial ankle. Similar wound over the right medial ankle healed largely with use of Apligraf. Most recently we have been using Hydrofera Blue over this wound with considerable improvement. The patient has been extensively worked up in the past for her venous insufficiency and she is not a candidate for antireflux surgery although I have none of the details available currently. 08/24/14; considerable improvement today. About 50% of this wound areas now epithelialized. The base of the wound appears to be healthier granulation.as opposed to last week when she had deteriorated a considerable improvement 08/17/14; unfortunately the wound has regressed somewhat. The areas of epithelialization from the superior aspect are not nearly as healthy as they were last week. The patient thinks her Hydrofera Blue slipped. 09/07/14; unfortunately the area has markedly regressed in the 2 weeks since I've seen this. There is an odor surrounding erythema. The healthy granulation tissue that we had at the base of the wound now is a dusky color. The nurse reports green drainage 09/14/14; the area looks somewhat better than last week. There is less erythema and less drainage. The culture I did did not show any growth. Nevertheless I think it is better to continue the Cipro and doxycycline for a further week. The remaining wound area was debridement. 09/21/14. Wound did not require debridement last week. Still less erythema and less drainage. She can complete her antibiotics. The areas of epithelialization in the superior aspect of the wound do not look as  healthy as they did some weeks ago 10/05/14 continued improvement in the condition of this wound. There is advancing epithelialization. Less aggressive debridement required 10/19/14 continued improvement in the condition and volume of this wound. Less aggressive debridement to  the inferior part of this to remove surface slough and fibrinous eschar 11/02/14 no debridement is required. The surface granulation appears healthy although some of her islands of epithelialization seem to have regressed. No evidence of infection 11/16/14; lites surface debridement done of surface eschar. The wound does not look to be unhealthy. No evidence of infection. Unfortunately the patient has had podiatry issues in the right foot and for some reason has redeveloped small surface ulcerations in the medial right ankle. Her original presentation involved wounds in this area 11/23/14 no debridement. The area on the right ankle has enlarged. The left ankle wound appears stable in terms of the surface although there is periwound inflammation. There has been regression in the amount of new skin 11/30/14 no debridement. Both wound areas appear healthy. There was no evidence of infection. The the new area on the right medial ankle has enlarged although that both the surfaces appear to be stable. 12/07/14; Debridement of the right medial ankle wound. No no debridement was done on the left. 12/14/14 no major change in and now bilateral medial ankle wounds. Both of these are very painful but the no overt evidence of infection. She has had previous venous ablation 12/21/14; patient states that her right medial ankle wound is considerably more painful last week than usual. Her left is also somewhat painful. She could not tolerate debridement. The right medial ankle wound has fibrinous surface eschar 12/28/14 this is a patient with severe bilateral venous insufficiency ulcers. For a considerable period of time we actually had the one on the  right medial ankle healed however this recently opened up again in June. The left medial ankle wound has been a refractory area with some absent flows. We had some success with Hydrofera Blue on this area and it literally closed by 50% however it is recently opened up Foley. Both of these were debridement today of surface eschar. She tolerates this poorly 01/25/15: No change in the status of this. Thick adherent escar. Very poor tolerance of any attempt at debridement. I had healed the right medial malleolus wound for a considerable amount of time and had the left one down to about 50% of the volume although this is totally regressed over the last 48 weeks. Further the right leg has reopened. she is trying to make a appointment with pain and vascular, previous ablations with Dr. Aleda Grana. I do not believe there is an arterial insufficiency issue here 02/01/15 the status of the adherent eschar bilaterally is actually improved. No debridement was done. She did not manage to get vascular studies done 02/08/15 continued debridement of the area was done today. The slough is less adherent and comes off with less pressure. There is no surrounding infection peripheral pulses are intact 02/15/15 selective debridement with a disposable curette. Again the slough is less adherent and comes off with less difficulty. No surrounding infection peripheral pulses are intact. 02/22/15 selective debridement of the right medial ankle wound. Slough comes off with less difficulty. No obvious surrounding infection peripheral pulses are intact I did not debridement the one on the left. Both of these are stable to improved 03/01/15 selective debridement of both wound areas using a curette to. Adherent slough cup soft with less difficulty. No obvious surrounding infection. The patient tells me that 2 days ago she noted a rash above the right leg wrap. She did not have this on her lower legs when she change this over she arrives  with widespread left greater than right almost  folliculitis-looking rash which is extremely pruritic. I don't see anything to culture here. There is no rash on the rest of her body. She feels well systemically. 03/08/15; selective debridement of both wounds using a curette. Base of this does not look unhealthy. She had limegreen drainage coming out of the left leg wound and describes a lot of drainage. The rash on her left leg looks improved to. No cultures were done. 03/22/15; patient was not here last week. Basal wounds does not look healthy and there is no surrounding erythema. No drainage. There is still a rash on the left leg that almost looks vasculitic however it is clearly limited to the top of where the wrap would be. 04/05/15; on the right required a surgical debridement of surface eschar and necrotic subcutaneous tissue. I did not debridement the area on the left. These continue to be large open wounds that are not changing that much. We were successful at one point in healing the area on the right, and at the same time the area on the left was roughly half the size of current measurements. I think a lot of the deterioration has to do with the prolonged time the patient is on her feet at work 04/19/15 I attempted-like surface debridement bilaterally she does not tolerate this. She tells me that she was in allergic care yesterday with extreme pain over her left lateral malleolus/ankle and was told that she has an "sprain" 05/03/15; large bilateral venous insufficiency wounds over the medial malleolus/medial aspect of her ankles. She complains of copious amounts of drainage and his usual large amounts of pain. There is some increasing erythema around the wound on the right extending into the medial aspect of her foot to. historically she came in with these wounds the right one healed and the left one came down to roughly half its current size however the right one is reopened and the left  is expanded. 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 05/10/15 large bilateral venous insufficiency wounds. There is less adherence surface left however the surface culture that I did last week grew pseudomonas therefore bilateral selective debridement score necessary. There is surrounding erythema. The patient describes severe bilateral drainage and a lot of pain in the left ankle. Apparently her podiatrist was were ready to do a cortisone shot 05/17/15; the patient complains of pain and again copious amounts of drainage. 05/24/15; we used Iodo flex last week. Patient notes considerable improvement in wound drainage. Only needed to change this once. 05/31/15; we continued Iodoflex; the base of these large wounds bilaterally is not too bad but there is probably likely a significant bioburden here. I would like to debridement just doesn't tolerate it. 06/06/14 I would like to continue the Iodoflex although she still hasn't managed to obtain supplies. She has bilateral medial malleoli or large wounds which are mostly superficial. Both of them are covered circumferentially with some nonviable fibrinous slough although she tolerates debridement very poorly. She apparently has an appointment for an ablation on the right leg by interventional radiology. 06/14/15; the patient arrives with the wounds and static condition. We attempted a debridement although she does not do well with this secondary to pain. I 07/05/15; wounds are not much smaller however there appears to be a cleaner granulating base. The left has tight fibrinous slough greater than the right. Debridement is tolerated poorly due to pain. Iodoflex is done more for these  wounds in any of the multitude of different dressings I have tried on the left 1 and then subsequently the right. 07/12/15; no change in the condition of this wound. I am able to do an aggressive debridement on the right but not the  left. She simply cannot tolerate it. We have been using Iodoflex which helps somewhat. It is worthwhile remembering that at one point we healed the right medial ankle wound and the left was about 25% of the current circumference. We have suggested returning to vascular surgery for review of possible further ablations for one reason or another she has not been able to do this. 07/26/15 no major change in the condition of either wound on her medial ankle. I did not attempt to debridement of these. She has been aggressively scrubbing these while she is in the shower at home. She has her supply of Iodoflex which seems to have done more for these wounds then anything I have put on recently. 08/09/15 wound area appears larger although not verified by measurements. Using Iodoflex 09/05/2015 -- she was here for avisit today but had significant problems with the wound and I was asked to see her for a physician opinion. I have summarize that this lady has had surgery on her left lower extremity about 10 years ago where the possible veins stripping was done. She has had an opinion from interventional radiology around November 2016 where no further sclerotherapy was ordered. The patient works 12 hours a day and stands on a concrete floor with work boots and is unable to get the proper compression she requires and cannot elevate her limbs appropriately at any given time. She has recently grown Pseudomonas from her wound culture but has not started her ciprofloxacin which was called in for her. 09/13/15 this continues to be a difficult situation for this patient. At one point I had this wound down to a 1.5 x 1.5" wound on her left leg. This is deteriorated and the right leg has reopened. She now has substantial wounds on her medial calcaneus, malleoli and into her lower leg. One on the left has surface eschar but these are far too painful for me to debridement here. She has a vascular surgery appointment next week to  see if anything can be done to help here. I think she has had previous ablations several years ago at [facility]. She has no major edema. She tells me that she did not get product last time [facility]] and went for several days without it. She continues to work in work boots 12 hours a day. She cannot get compression/4-layer under her work boots. 09/20/15 no major change. Periwound edema control was not very good. Her point with pain and vascular is next Wednesday the 25th 09/28/15; the patient is seen vascular surgery and is apparently scheduled for repeat duplex ultrasounds of her bilateral lower legs next week. 10/05/15; the patient was seen by Dr. Doren Custard of vascular surgery. He feels that she should have arterial insufficiency excluded as cause/contributed to her nonhealing stage she is therefore booked for an arteriogram. She has apparently monophasic signals in the dorsalis pedis pulses. She also of course has known severe chronic venous insufficiency with previous procedures as noted previously. I had another long discussion with the patient today about her continuing to work 12 hour shifts. I've written her out for 2 months area had concerns about this as her work location is currently undergoing significant turmoil and this may lead to her termination.  She is aware of this however I agree with her that she simply cannot continue to stand for 12 hours multiple days a week with the substantial wound areas she has. 10/19/15; the Dr. Doren Custard appointment was largely for an arteriogram which was normal. She does not have an arterial issue. He didn't make a comment about her chronic venous insufficiency for which she has had previous ablations. Presumably it was not felt that anything additional could be done. The patient is now out of work as I prescribed 2 weeks ago. Her wounds look somewhat less aggravated presumably because of this. I felt I would give debridement another try today 10/25/15; no major  change in this patient's wounds. We are struggling to get her product that she can afford into her own home through her insurance. 11/01/15; no major change in the patient's wounds. I have been using silver alginate as the most affordable product. I spoke to Dr. Marla Roe last week with her requested take her to the OR for surgical debridement and placement of ACEL. Dr. Marla Roe told me that she would be willing to do this however [facility] will not cover this, fortunately the patient has [place] healthcare of some variant 11/08/15; no major change in the patient's wounds. She has been completely nonviable surface that this but is in too much pain with any attempted debridement are clinic. I have arranged for her to see Dr. Marla Roe ham of plastic surgery and this appointment is on Monday. I am hopeful that they will take her to the OR for debridement, possible ACEL ultimately possible skin graft 11/22/15 no major change in the patient's wounds over her bilateral medial calcaneus medial malleolus into the lower legs. Surface on these does not look too bad however on the left there is surrounding erythema and tenderness. This may be cellulitis or could him sleepy tinea. 11/29/15; no major changes in the patient's wounds over her bilateral medial malleolus. There is no infection here and I don't think any additional antibiotics are necessary. There is now plan to move forward. She sees Dr. Marla Roe in a week's time for preparation for operative debridement and ACEL placement I believe on 7/12. She then has a follow-up appointment with Dr. Marla Roe on 7/21 12/28/15; the patient returns today having been taken to the [place] by Dr. Marla Roe 12/12/15 she underwent debridement, intraoperative cultures [which were negative]. She had placement of a wound VAC. Parent really ACEL was not available to be placed. The wound VAC foam apparently adhered to the wound since then she's been using silver  alginate, Xeroform under Ace wraps. She still says there is a lot of drainage and a lot of pain 01/31/16; this is a patient I see monthly. I had referred her to Dr. Marla Roe him of plastic surgery for large wounds on her bilateral medial ankles. She has been to the OR twice once in early July and once in early August. She tells me over the last 3 weeks she has been using the wound VAC with ACEL underneath it. On the right we've simply been using silver alginate. Under Kerlix Coban wraps. 02/28/16; this is a patient I'm currently seeing monthly. She is gone on to have a skin graft over her large venous insufficiency ulcer on the left medial ankle. This was done by Dr. Marla Roe him. The patient is a bit perturbed about why she didn't have one on her right medial ankle wound. She has been using silver alginate to this. 03/06/16; I received  a phone call from her plastic surgery Dr. Marla Roe. She expressed some concern about the viability of the skin graft she did on the left medial ankle wound. Asked me to place Endoform on this. She told me she is not planning to do a subsequent skin graft on the right as the left one did not take very well. I had placed Hydrofera Blue on the right 03/13/16; continue to have a reasonably healthy wound on the right medial ankle. Down to 3 mm in terms of size. There is epithelialization here. The area on the left medial ankle is her skin graft site. I suppose the last week this looks somewhat better. She has an open area inferiorly however in the center there appears to be some viable tissue. There is a lot of surface callus and eschar that will eventually need to come off however none of this looked to be infected. Patient states that the is able to keep the dressing on for several days which is an improvement. 03/20/16 no major change in the circumference of either wound however on the left side the patient was at Dr. Eusebio Friendly office and they did a debridement of  left wound. 50% of the wound seems to be epithelialized. I been using Endoform on the left Hydrofera Blue in the right 03/27/16; she arrives today with her wound is not looking as healthy as they did last week. The area on the right clearly has an adherent surface to this a very similar surface on the left. Unfortunately for this patient this is all too familiar problem. Clearly the Endoform is not working and will need to change that today that has some potential to help this surface. She does not tolerate debridement in this clinic very well. She is changing the dressing wants 04/03/16; patient arrives with the wounds looking somewhat better especially on the right. Dr. Migdalia Dk change the dressing to silver alginate when she saw her on Monday and also sold her some compression socks. The usefulness of the latter is really not clear and woman with severely draining wounds. 04/10/16; the patient is doing a bit of an experiment wearing the compression stockings that Dr. Migdalia Dk provided her to her left leg and the out of legs based dressings that we provided to the right. 05/01/16; the patient is continuing to wear compression stockings Dr. Migdalia Dk provided her on the left that are apparently silver impregnated. She has been using Iodoflex to the right leg wound. Still a moderate amount of drainage, when she leaves here the wraps only last for 4 days. She has to change the stocking on the left leg every night 05/15/16; she is now using compression stockings bilaterally provided by Dr. Marla Roe. She is wearing a nonadherent layer over the wounds so really I don't think there is anything specific being done to this now. She has some reduction on the left wound. The right is stable. I think all healing here is being done without a specific dressing 06/09/16; patient arrives here today with not much change in the wound certainly in diameter to large circular wounds over the medial aspect of her  ankle bilaterally. Under the light of these services are certainly not viable for healing. There is no evidence of surrounding infection. She is wearing compression stockings with some sort of silver impregnation as prescribed by Dr. Marla Roe. She has a follow-up with her tomorrow. 06/30/16; no major change in the size or condition of her wounds. These are still probably covered with a  nonviable surface. She is using only her purchase stockings. She did see Dr. Marla Roe who seemed to want to apply Dakin's solution to this I'm not extreme short what value this would be. I would suggest Iodoflex which she still has at home. 07/28/16; I follow Mrs. Crepeau episodically along with Dr. Marla Roe. 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 She has been applying a topical collagen ointment to both wounds with Adaptic. I don't think Dr. Marla Roe is planning to take her back to the OR. 08/19/16; I follow Mrs. Jeneen Rinks on a monthly basis along with Dr. Marla Roe of plastic surgery. 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 I been following her for a number of years. At one point I was able to get the right medial malleolus wound to heal and had the left medial malleolus down to about half its current size however and I had to send her to plastic surgery for an operative debridement. Since then things have been stable to slightly improve the area on the right is slightly better one in the left about the same although there is much less adherent surface than I'm used to with this patient. She is using some form of liquid collagen gel that Dr. Marla Roe provided a Kerlix cover with the patient's own pressure stockings. She tells me that she has extreme pain in both ankles and along the lateral aspect of both feet. She has been unable to work for some period of time. She is telling  me she is retiring at the beginning of April. She sees Dr. Doran Durand of orthopedics next week 09/22/16; patient has not seen Dr. Marla Roe since the last time she is here. I'm not really sure what she is using to the wounds other than bits and pieces of think she had left over including most recently Hydrofera Blue. She is using juxtalite stockings. She is having difficulty with her husband's recent illness "stroke". She is having to transport him to various doctors appointments. Dr. Marla Roe left her the option of a repeat debridement with ACEL however she has not been able to get the time to follow-up on this. She continues to have a fair amount of drainage out of these wounds with certainly precludes leaving dressings on all week 10/13/16; patient has not seen Dr. Marla Roe since she was last in our clinic. I'm not really sure what she is doing with the wounds, we did try to get her [facility] and I think she is actually using this most of the time. Because of drainage she states she has to change this every second day although this is an improvement from what she used to do. She went to see Dr. Doran Durand who did not think she had a muscular issue with regards to her feet, he referred her to a neurologist and I think the appointment is sometime in June. I changed her back to Iodoflex which she has used in the past but not recently. 11/03/16; the patient has been using Iodoflex although she ran out of this. Still claims that there is a lot of drainage although the wound does not look like this. No surrounding erythema. She has not been back to see Dr. Marla Roe 11/24/16; the patient has been using Iodoflex again but she ran out of it 2 or 3 days ago. There is no major change in the condition of either one of these wounds in  fact they are larger and covered in a thick adherent surface slough/nonviable tissue especially on the left. She does not tolerate mechanical debridement in our clinic. Going back  to see Dr. Marla Roe of plastic surgery for an operative debridement would seem reasonable. 12/15/16; the patient has not been back to see Dr. Marla Roe. She is been dealing with a series of illnesses and her husband which of monopolized her time. She is been using Sorbact which we largely supplied. She states the drainage is bad enough that it maximum she can go 2-3 days without changing the dressing 01/12/2017 -- the patient has not been back for about 4 weeks and has not seen Dr. Marla Roe not does she have any appointment pending. 01/23/17; patient has not seen Dr. Marla Roe even though I suggested this previously. She is using Santyl that was suggested last week by Dr. Con Memos this Cost her $16 through her insurance which is indeed surprising 02/12/17; continuing Santyl and the patient is changing this daily. A lot of drainage. She has not been back to see plastic surgery she is using an Ace wrap. Our intake nurse suggested wrap around stockings which would make a good reasonable alternative 02/26/17; patient is been using Santyl and changing this daily due to drainage. She has not been to see plastic surgery she uses in April Ace wrap to control the edema. She did obtain extremitease stockings but stated that the edema in her leg was to big for these 03/20/17; patient is using Santyl and Anasept. Surfaces looked better today the area on the right is actually measuring a little smaller. She has states she has a lot of pain in her feet and ankles and is asking for a consult to pain control which I'll try to help her with through our case manager. 04/10/17; the patient arrives with better-looking wound surfaces and is slightly smaller wound on the left she is using a combination of Santyl and Anasept. She has an appointment or at least as started in the pain control center associated with [facility] 05/14/17; this is a patient who I followed for a prolonged period of time. 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 At one point I had this down to a much smaller wound on the left however these reopened and we've never been able to get these to heal. She has been using Santyl and Anasept gel although 2 weeks ago she ran out of the Anasept gel. She has a stable appearance of the wound. She is going to the wound care clinic at [facility]. They wanted do a nerve block/spinal block although she tells me she is reluctant to go forward with that. 05/21/17; this is a patient I have followed for many years. 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 Chronic pain and deformity in her ankles as well. She is been to see plastic surgery as well as orthopedics. Using PolyMem AG most recently/Kerramax/ABDs and 2 layer compression. She has managed to keep this on and she is coming in for a nurse check to change the dressing on Tuesdays, we see her on Fridays 06/05/17; really quite a good looking surface and the area especially on the right medial has contracted in terms of dimensions. Well granulated healthy-looking tissue on both sides. Even with an open curet there is nothing that even feels abnormal here. This is as good as I've seen this in quite some time. We have been using PolyMem AG and bringing her  in for a nurse check 06/12/17; really quite good surface on both of these wounds. The right medial has contracted a bit left is not. We've been using PolyMem and AG and she is coming in for a nurse visit 06/19/17; we have been using PolyMem AG and bringing her in for a nurse check. Dimensions of her wounds are not better but the surfaces looked better bilaterally. She complained of bleeding last night and the left wound and increasing pain bilaterally. She states her wound pain is more neuropathic than just the wounds. There was some suggestion that this was radicular from her pain management doctor in talking to her it is really difficult to sort  this out. 06/26/17; using PolyMem and AG and bringing her in for a nurse check as All of this and reasonably stable condition. Certainly not improved. The dimensions on the lateral part of the right leg look better but not really measuring better. The medial aspect on the left is about the same. 07/03/16; we have been using PolyMen AG and bringing her in for a nurse check to change the dressings as the wounds have drainage which precludes once weekly changing. We are using all secondary absorptive dressings.our intake nurse is brought up the idea of using a wound VAC/snap VAC on the wound to help with the drainage to see if this would result in some contraction. This is not a bad idea. The area on the right medial is actually looking smaller. Both wounds have a reasonable-looking surface. There is no evidence of cellulitis. The edema is well controlled 07/10/17; the patient was denied for a snap VAC by her insurance. The major issue with these wounds continues to be drainage. We are using wicked PolyMem AG and she is coming in for a nurse visit to change this. The wounds are stable to slightly improved. The surface looks vibrant and the area on the right certainly has shrunk in size but very slowly 07/17/17; the patient still has large wounds on her bilateral medial malleoli. Surface of both of these wounds looks better. The dimensions seem to come and go but no consistent improvement. There is no epithelialization. We do not have options for advanced treatment products due to insurance issues. They did not approve of the wound VAC to help control the drainage. More recently we've been using PolyMem and AG wicked to allow drainage through. We have been bringing her in for a nurse visit to change this. We do not have a lot of options for wound care products and the home again due to insurance issues 07/24/17; the patient's wound actually looks somewhat better today. No drainage measurements are smaller still  healthy-looking surface. We used silver collagen under PolyMen started last week. We have been bringing her in for a dressing change 07/31/17; patient's wound surface continued to look better and I think there is visible change in the dimensions of the wound on the right. Rims of epithelialization. We have been using silver collagen under PolyMen and bringing her in for a dressing change. There appears to be less drainage although she is still in need of the dressing change 08/07/17. Patient's wound surface continues to look better on both sides and the area on the right is definitely smaller. We have been using silver collagen and PolyMen. She feels that the drainage has been it has been better. I asked her about her vascular status. She went to see Dr. Aleda Grana at [facility] and had some form of ablation. I  don't have much detail on this. I haven't my notes from 2016 that she was not a candidate for any further ablation but I don't have any more information on this. We had referred her to vein and vascular I don't think she ever went. He does not have a history of PAD although I don't have any information on this either. We don't even have ABIs in our record 08/14/17; we've been using silver collagen and PolyMen cover. And putting the patient and compression. She we are bringing her in as a nurse visit to change this because ofarge amount of drainage. We didn't the ABIs in clinic today since they had been done in many moons 1.2 bilaterally. She has been to see vein and vascular however this was at [facility] and she had ablation although I really don't have any information on this all seemed biking get a report. She is also been operatively debrided by plastic surgery and had a cell placed probably 8-12 months ago. This didn't have a major effect. We've been making some gains with current dressings 08/19/17-She is here in follow-up evaluation for bilateral medial malleoli ulcers. She continues  to tolerate debridement very poorly. We will continue with recently changed topical treatment; if no significant improvement may consider switching to Iodosorb/Iodoflex. 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 She has been using silver collagen and PolyMem. I believe she has been used and tried on Iodoflex before. 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 These have been very refractory over time. We have been using silver collagen and PolyMen. She is coming in once a week for a doctor's and once a week for nurse visits. We are actually making some progress 09/18/17; the patient's wounds are smaller especially on the right medial. She arrives today to upset to consider even washing these off with Anasept which I think is been part of the reason this is been closing. We've been using collagen covered in PolyMen otherwise. It is noted that she has a small area of folliculitis on the right medial calf that. As we are wrapping her legs I'll give her a short course of doxycycline to make sure this doesn't amount to anything. She is a long list of complaints today including imbalance, shortness of breath on exertion, inversion of her left ankle. With regards to the latter complaints she is been to see orthopedics and they offered her a tendon release surgery I believe but wanted her wounds to be closed first. I have recommended she go see her primary physician with regards to everything else. 09/25/17; patient's wounds are about the same size. We have made some progress bilaterally although not in recent weeks. She will not allow me T wash these wounds with Anasept even if she is doing her cell. Wheeze we've been using collagen covered in PolyMen. Last week she had a  small area of folliculitis this is now opened into a small wound. She completed 5 days of trimethoprim sulfamethoxazole 10/02/17; unfortunately the area on her left medial ankle is worse with a larger wound area towards the Achilles. The patient complains of a lot of pain. She will not allow debridement although visually I don't think there is anything to debridement in any case. We have been using silver collagen and  PolyMen for several months now. Initially we are making some progress although I'm not really seeing that today. We will move back to [facility]. His admittedly this is a bit of a repeat however I'm hoping that his situation is different now. The patient tells me she had her leg on the left give out on her yesterday this is process some pain. 10/09/17; the patient is seen twice a week largely because of drainage issues coming out of the chronic medial bimalleolar wounds that are chronic. Last week the dimensions of the one on the left looks a little larger I changed her to [facility]. She comes in today with a history of terrible pain in the bilateral wound areas. She will not allow debridement. She will not even allow a tissue culture. There is no surrounding erythema no no evidence of cellulitis. We have been putting her Kerlix Coban man. She will not allow more aggressive compression as there was a suggestion to put her in 3 layer wraps. 10/16/17; large wounds on her bilateral medial malleoli. These are chronic. Not much change from last week. The surface looks have healthy but absolutely no epithelialization. A lot of pain little less so of drainage. She will not allow debridement or even washing these off in the vigorous fashion with Anasept. 10/23/17; large wounds on her bilateral malleoli which are chronic. Some improvement in terms of size perhaps on the right since last time I saw these. She states that after we increased the 3 layer compression there was some bleeding, when  she came in for a nurse visit she did not want 3 layer compression put back on about our nurse managed to convince her. She has known chronic venous visit issues and I'm hoping to get her to tolerate the 3 layer compression. using Hydrofera Blue 10/30/17; absolutely no change in the condition of either wound although we've had some improvement in dimensions on the right.. Attempted to put her in 3 layer compression she didn't tolerated she is back in 2 layer compression. We've been using Hydrofera Blue We looked over her past records. She had venous reflux studies in November 2016. There was no evidence of deep venous reflux on the right. Superficial vein did not show the greater saphenous vein at think this is been previously ablated the small saphenous vein was within normal limits. The left deep venous system showed no DVT the vessels were positive for deep venous reflux in the posterior tibial veins at the ankle. The greater saphenous vein was surgically absent small saphenous vein was within normal limits. She went to vein and vascular at [facility]. I believe she had an ablation on the left greater saphenous vein. I'll update her reflux studies perhaps ever reviewed by vein and vascular. We've made absolutely no progress in these wounds. Will also try to read and TheraSkins through her insurance 11/06/17; W the patient apparently has a 2 week follow-up with vein and vascular I like him to review the whole issue with regards to her previous vascular workup by Dr. Aleda Grana. We've really made no progress on these wounds in many months. She arrives today with less viable looking surface on the left medial ankle wound. This was apparently looking about the same on Tuesday when she was here for nurse visit. 11/13/17; deep tissue culture I did last time of the left lower leg showed multiple organisms without any predominating. In particular no Staphylococcus or group A strep were isolated. We sent  her for  venous reflux studies. She's had a previous left greater saphenous vein stripping and I think sclerotherapy of the right greater saphenous vein. She didn't really look at the lesser saphenous vein this both wounds are on the medial aspect. She has reflux in the common femoral vein and popliteal vein and an accessory vein on the right and the common femoral vein and popliteal vein on the left. I'm going to have her go to see vein and vascular just the look over things and see if anything else beside aggressive compression is indicated here. We have not been able to make any progress on these wounds in spite of the fact that the surface of the wounds is never look too bad. 11/20/17; no major change in the condition of the wounds. Patient reports a large amount of drainage. She has a lot of complaints of pain although enlisting her today I wonder if some of this at least his neuropathic rather than secondary to her wounds. She has an appointment with vein and vascular on 12/30/17. The refractory nature of these wounds in my mind at least need vein and vascular to look over the wounds the recent reflux studies we did and her history to see if anything further can be done here. I also note her gait is deteriorated quite a bit. 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 She has a bilateral Trendelenburg gait. I wonder if this is neuropathic or perhaps multilevel radicular. 11/27/17; her wounds actually looks slightly better. Healthy-looking granulation tissue a scant amount of epithelialization. [place] healthcare will not pay for [employer]. They will play for tri layer Oasis and Dermagraft. This is not a diabetic ulcer. We'll try for the tri layer Oasis. She still complains of some drainage. She has a vein and vascular appointment on 12/30/17 12/04/17; the wounds visually look quite good. Healthy-looking granulation with some degree of epithelialization. We are still waiting for response to our  request for trial to try layer Oasis. Her appointment with vascular to review venous and arterial issues isn't sold the end of July 7/31. Not allow debridement or even vigorous cleansing of the wound surface. 12/18/17; slightly smaller especially on the right. 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 We've been using Hydrofera Blue. We still are looking into triple layer Oasis through her insurance 01/08/18 on evaluation today patient's wound actually appears to be showing signs of good improvement at this point in time. She has been tolerating the dressing changes without complication. Fortunately there does not appear to be any evidence of infection at this point in time. We have been utilizing silver nitrate which does seem to be of benefit for her which is also good news. Overall I'm very happy with how things seem to be both regards appearance as well as measurement. Patient did see Dr. Bridgett Larsson for evaluation on 12/30/17. In his assessment he felt that stripping would not likely add much more than chronic compression to the patient's healing process. His recommendation was to follow-up in three months with Dr. Doren Custard if she hasn't healed in order to consider referral back to you and see vascular where she previously was in a trial and was able to get her wound to heal. I'll be see what she feels she when you staying compression and he reiterated this as well. 01/13/18 on evaluation today patient appears to actually be doing very well in regard to her bilateral medial malleolus ulcers. She seems to have tolerated the chemical cauterization  with silver nitrate last week she did have some pain through that evening but fortunately states that I'll be see since it seems to be doing better she is overall pleased with the progress. 01/21/18; really quite a remarkable improvement since I've last seen these wounds. We started using silver nitrate specially on the islands of hyper  granulation which for some reason her around the wound circumference. This is really done quite nicely. Primary dressing Hydrofera Blue under 4 layer compression. She seems to be able to hold out without a nurse rewrap. Follow-up in 1 week 01/28/18; we've continued the hydrofera blue but continued with chemical cauterization to the wound area that we started about a month ago for irregular hyper granulation. She is made almost stunning improvement in the overall wound dimensions. I was not really expecting this degree of improvement in these chronic wounds 02/05/18; we continue with Hydrofera Bluebut of also continued the aggressive chemical cauterization with silver nitrate. We made nice progress with the right greater than left wound. 02/12/18. We continued with Hydrofera Blue after aggressive chemical cauterization with silver nitrate. We appear to be making nice progress with both wound areas 02/19/2018; we continue with [facility] after washing the wounds vigorously with Anasept spray and chemical cauterization with silver nitrate. We are making excellent progress. The area on the right's just about closed 02/26/2018. The area on the left medial ankle had too much necrotic debris today. I used a #5 curette we are able to get most of the soft. I continued with the silver nitrate to the much smaller wound on the right medial ankle she had a new area on her right lower pretibial area which she says was due to a role in her compression 03/05/2018; both wound areas look healthy. Not much change in dimensions from last week. I continue to use silver nitrate and Hydrofera Blue. The patient saw Dr. Doren Custard of vein and vascular. He felt she had venous stasis ulcers. He felt based on her previous arteriogram she should have adequate circulation for healing. Also she has deep venous reflux but really no significant correctable superficial venous reflux at this time. He felt we should continue with  conservative management including leg elevation and compression 04/02/2018; since we last saw this woman about a month ago she had a fall apparently suffered a pelvic fracture. I did not look up the x-ray. Nevertheless because of pain she literally was bedbound for 2 weeks and had home health coming out to change the dressing. Somewhat predictably this is resulted in considerable improvement in both wound areas. The right is just about closed on the medial malleolus and the left is about half the size. 04/16/2018; both her wounds continue to go down in size. Using Hydrofera Blue. 05/07/18; both her wounds appeared to be improving especially on the right where it is almost closed. We are using Hydrofera Blue 05/14/2018; slightly worse this week with larger wounds. Surface on the left medial not quite as good. We have been using Hydrofera Blue 05/21/18; again the wounds are slightly larger. Left medial malleolus slightly larger with eschar around the circumference. We have been using Hydrofera Blue undergoing a wraps for a prolonged period of time. This got a lot better when she was more recumbent due to a fall and a back injury. I change the primary dressing the silver alginate today. She did not tolerate a 4 layer compression previously although I may need to bring this up with her next time 05/28/2018; area  on the left medial malleolus again is slightly larger with more drainage. Area on the right is roughly unchanged. She has a small area of folliculitis on the right medial just on the lower calf. This does not look ominous. 06/03/2018 left medial malleolus slightly smaller in a better looking surface. We used silver nitrate on this last time with silver alginate. The area on the right appears slightly smaller 1/10; left medial malleolus slightly smaller. Small open area on the right. We used silver nitrate and silver alginate as of 2 weeks ago. We continue with the wound and compression. These got a  lot better when she was off her feet 1/17; right medial malleolus wound is smaller. The left may be slightly smaller. Both surfaces look somewhat better. 1/24; both wounds are slightly smaller. Using silver alginate under Unna boots 1/31; both wounds appear smaller in fact the area on the right medial is just about closed. Surface eschar. We have been using silver alginate under Unna boots. The patient is less active now spends let much less time on her feet and I think this is contributed to the general improvement in the wound condition 2/7; both wounds appear smaller. I was hopeful the right medial would be closed however there there is still the same small open area. Slight amount of surface eschar on the left the dimensions are smaller there is eschar but the wound edges appear to be free. We have been using silver alginate under Unna boot's 2/14; both wounds once again measure smaller. Circumferential eschar on the left medial. We have been using silver alginate under Unna boots with gradual improvement 2/21; the area on the right medial malleolus has healed. The area on the left is smaller. We have been using silver alginate and Unna boots. We can discharge wrapping the right leg she has 20/30 stockings at home she will need to protect the scar tissue in this area 2/28; the area on the right medial malleolus remains closed the patient has a compression stocking. The area on the left is smaller. We have been using silver alginate and Unna boots. 3/6 the area on the right medial ankle remains closed. Good edema control noted she is using her own compression stocking. The area on the left medial ankle is smaller. We have been managing this with silver alginate and Unna boots which we will continue today. 3/13; the area on the right medial ankle remains closed and I'm declaring it healed today. When necessary the left is about the same still a healthy-looking surface but no major change and wound  area. No evidence of infection and using silver alginate under unna and generally making considerable improvement 3/27 the area on the right medial ankle remains closed the area on the left is about the same as last week. Certainly not any worse we have been using silver alginate under an Unna boot 4/3; the area on the right medial ankle remains closed per the patient. We did not look at this wound. The wound on the left medial ankle is about the same surface looks healthy we have been using silver alginate under an Unna boot 4/10; area on the right medial ankle remains closed per the patient. We did not look at this wound. The wound on the left medial ankle is slightly larger. The patient complains that the [facility] caused burning pain all week. She also told us that she was a lot more active this week. Changed her back to silver alginate 4/17;  right medial ankle still closed per the patient. Left medial ankle is slightly larger. Using silver alginate. She did not tolerate Hydrofera Blue on this area 4/24; right medial ankle remains closed we have not look at this. The left medial ankle continues to get larger today by about a centimeter. We have been using silver alginate under Unna boots. She complains about 4 layer compression as an alternative. She has been up on her feet working on her garden 5/8; right medial ankle remains closed we did not look at this. The left medial ankle has increased in size about 100%. We have been using silver alginate under Unna boots. She noted increased pain this week and was not surprised that the wound is deteriorated 5/15; no major change in SA however much less erythema ( one week of doxy ocellulitis). 5/22-64 year old female returns at 1 week to the clinic for left medial ankle wound for which we have been using silver alginate under 3 layer compression She was placed on DOXY at last visit - the wound is wider at this visit. She is in 3 layer  compression 5/29; change to [facility] last week. I had given her empiric doxycycline 2 weeks ago for a week. She is in 3 layer compression. She complains of a lot of pain and drainage on presentation today. 6/5; using Hydrofera Blue. I gave her doxycycline recently empirically for erythema and pain around the wound. Believe her cultures showed enterococcus which not would not have been well covered by doxycycline nevertheless the wound looks better and I don't feel specifically that the enterococcus needs to be covered. She has a new what looks like a wrap injury on her lateral left ankle. 6/12; she is using Hydrofera Blue. She has a new area on the left anterior lower tibial area. This was a wrap injury last week. 6/19; the patient is using Hydrofera Blue. She arrived with marked inflammation and erythema around the wound and tenderness. 12/01/18 on evaluation today patient appears to be doing a little bit better based on what I'm hearing from the standpoint of lassos evaluation to this as far as the overall appearance of the wound is concerned. Then sometime substandard she typically sees Dr. Dellia Nims. Nonetheless overall very pleased with the progress that she's made up to this point. No fevers, chills, nausea, or vomiting noted at this time. 7/10; some improvement in the surface area. Aggressively debrided last week apparently. I went ahead with the debridement today although the patient does not tolerate this very well. We have been using Iodoflex. Still a fair amount of drainage 7/17; slightly smaller. Using Iodoflex. 7/24; no change from last week in terms of surface area. We have been using Iodoflex. Surface looks and continues to look somewhat better 7/31; surface area slightly smaller better looking surface. We have been using Iodoflex. This is under Unna boot compression 8/7-Patient presents at 1 week with Unna boot and Iodoflex, wound appears better 8/14-Patient presents at 1 week with  Iodoflex, we use the Unna boot, wound appears to be stable better.Patient is getting Botox treatment for the inversion of the foot for tendon release, Next week 8/21; we are using Iodoflex. Unna boot. The wound is stable in terms of surface area. Under illumination there is some areas of the wound that appear to be either epithelialized or perhaps this is adherent slough at this point I was not really clear. It did not wipe off and I was reluctant to debride this today. 8/28; we are  using Iodoflex in an [employer]. Seems to be making good improvement. 9/4; using Iodoflex and wound is slightly smaller. 9/18; we are using Iodoflex with topical silver nitrate when she is here. The wound continues to be smaller 10/2; patient missed her appointment last week due to GI issues. She left and Iodoflex based dressing on for 2 weeks. Wound is about the same size about the size of a dime on the left medial lower 10/9 we have been using Iodoflex on the medial left ankle wound. She has a new superficial probable wrap injury on the dorsal left ankle 10/16; we have been using Hydrofera Blue since last week. This is on the left medial ankle 10/23; we have been using Hydrofera Blue since 2 weeks ago. This is on the left medial ankle. Dimensions are better 11/6; using Hydrofera Blue. I think the wound is smaller but still not closed. Left medial ankle 11/13; we have been using Hydrofera Blue. Wound is certainly no smaller this week. Also the surface not as good. This is the remanent of a very large area on her left medial ankle. 11/20; using Sorbact since last week. Wound was about the same in terms of size although I was disappointed about the surface debris 12/11; 3-week follow-up. Patient was on vacation. Wound is measuring slightly larger we have been using Sorbact. 12/18; wound is about the same size however surface looks better last week after debridement. We have been using Sorbact under compression 1/15 wound  is probably twice the size of last time increased in length nonviable surface. We have been using Sorbact. She was running a mild fever and missed her appointment last week 1/22; the wound is come down in size but under illumination still a very adherent debris we have been Hydrofera Blue that I changed her to last week 1/29; dimensions down slightly. We have been using Hydrofera Blue 2/19 dimensions are the same however there is rims of epithelialization under illumination. Therefore more the surface area may be epithelialized 2/26; the patient's wound actually measures smaller. The wound looks healthy. We have been using Hydrofera Blue. I had some thoughts about running Apligraf then I still may do that however this looks so much better this week we will delay that for now 3/5; the wound is small but about the same as last week. We have been using Hydrofera Blue. No debridement is required today. 3/19; the wound is about the size of a dime. Healthy looking wound even under illumination. We have been using Hydrofera Blue. No mechanical debridement is necessary 3/26; not much change from last week although still looks very healthy. We have been using Hydrofera Blue under Unna boots Patient was offered an ankle fusion by podiatry but not until the wound heals with a proceed with this. 4/9; the patient comes in today with her original wound on the medial ankle looking satisfactory however she has some uncontrolled swelling in the middle part of her leg with 2 new open areas superiorly just lateral to the tibia. I think this was probably a wrap issue. She said she felt uncomfortable during the week but did not call in. We have been using Hydrofera Blue 4/16; the wound on the medial ankle is about the same. She has innumerable small areas superior to this across her mid tibia. I think this is probably folliculitis. She is also been working in the yard doing a lot of sweating 4/30; the patient issue on  the upper areas across her mid  tibia of all healed. I think this was excessive yard work if I remember. Her wound on the medial ankle is smaller. Some debris on this we have been using Hydrofera Blue under Unna boots 5/7; mid tibia. She has been using Hydrofera Blue under an Unna wrap. She is apparently going for her ankle surgery on June 3 10/28/19-Patient returns to clinic with the ankle wound, we are using Hydrofera Blue under Unna wrap, surgery is scheduled for her left foot for June 3 so she will be back for nurse visit next week READMISSION 01/17/2020 Mrs. Dionisio is a 64 year old woman we have had in this clinic for a long period of time with severe venous hypertension and refractory wounds on her medial lower legs and ankles bilaterally. 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 When she was off her legs for a prolonged period example when she fell and suffered a compression fracture things would heal up quite nicely. She is now retired and we managed to heal up the right medial leg wound. The left one was very tiny last time I saw this although still refractory. 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 It got to the point where this was interfering with her walking and she elected to proceed with a ankle arthrodesis to straighten her her ankle and leave her with a functional outcome for mobilization. The patient was referred to Dr. Doren Custard and really this took some time to arrange. Dr. Doren Custard saw her on 12/07/2019. Once again he verified that she had no arterial issues. She had previously had an angiogram several years ago. Follow-up ABIs on the left showed an ABI of 1.12 with triphasic waveforms and a TBI of 0.92. She is felt to have chronic deep venous insufficiency but I do not think  it was felt that anything could be done from about this from an ablation point of view. At the time Dr. Doren Custard saw this patient the wounds actually look closed via the pictures in his clinic. The patient finally underwent her surgery on 12/15/2019. This went reasonably well and there was a good anatomic outcome. She developed a small distal wound dehiscence on the lateral part of the surgical wound. However more problematically she is developed recurrence of the wound on the medial left ankle. There are actually 2 wounds here one in the distal lower leg and 1 pretty much at the level of the medial malleolus. It is a more distal area that is more problematic. She has been using Hydrofera Blue which started on Friday before this she was simply Ace wrapping. There was a culture done that showed Pseudomonas and she is on ciprofloxacin. A recent CNS on 8/11 was negative. The patient reports some pain but I generally think this is improving. She is using a cam boot completely nonweightbearing using a walker for pivot transfers and a wheelchair 8/24; not much improvement unfortunately she has a surgical wound on the lateral part in the venous insufficiency wound medially. The bottom part of the medial insufficiency wound is still necrotic there is exposed tendon here. We have been using Hydrofera Blue under compression. Her edema control is however better 8/31; patient in for follow-up of his surgical wound on the lateral part of her left leg and chronic venous insufficiency ulcers  medially. We put her back in compression last week. She comes in today with a complaint of 3 or 4 days worth of increasing pain. She felt her cam walker was rubbing on the area on the back of her heel. However there is intense erythema seems more likely she has cellulitis. She had 2 cultures done when she was seeing podiatry in the postop. One of them in late July showed Pseudomonas and she received a course of ciprofloxacin the  other was negative on 8/11 she is allergic to penicillin with anaphylactoid complaints of hives oral swelling via information in epic 9/9; when I saw this patient last week she had intense anterior erythema around her wound on the right lateral heel and ankle and also into the right medial heel. Some of this was no doubt drainage and her walker boot however I was convinced she had cellulitis. I gave her Levaquin and Bactrim she is finishing up on this now. She is following up with Dr. [name] he saw her yesterday. He is taken her out of the walking boot of course she is still nonweightbearing. Her x-ray was negative for any worrisome features such as soft tissue air etc. Things are a lot better this week. She has home health. We have been using Hydrofera Blue under an [employer] which she put back on yesterday. I did not wrap her last week 9/17; her surrounding skin looks a lot better. In fact the area on the left lateral ankle has just a scant amount of eschar. The only remaining wound is the large area on the left medial ankle. Probably about 60% of this is healthy granulation at the surface however she has a significant divot distally. This has adherent debris in it. I been using debridement and silver collagen to try and get this area to fill-in although I do not think we have made much progress this week 9/24; the patient's wound on the left medial ankle looks a lot better. The deeper divot area distally still requires debridement but this is cleaning up quite nicely we have been using silver collagen. The patient is complaining of swelling in her foot and is worried that that is contributing to the nonhealing of the ankle wound. She is also complaining of numbness in her anterior toes 10/4; left medial ankle. The small area distally still has a divot with necrotic material that I have been debriding away. This has an undermining area. She is approved for Apligraf. She saw Dr. [name] her surgeon on  10/1. I think he declared himself is satisfied with the condition of things. Still nonweightbearing till the end of the month. 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 Her surgical wound is well closed. There is no evidence of infection 10/11; the patient arrived in clinic today with the expectation that we be able to put an Apligraf on this area after debridement however she arrives with a relatively large amount of green drainage on the dressing. The patient states that this started on Friday. She has not been systemically unwell. 10/19; culture I did last week showed both Enterococcus and Pseudomonas. I think this came in separate parts because I stopped her ciprofloxacin I gave her and prescribed her linezolid however now looking at the final culture result this is Pseudomonas which is resistant to quinolones. She has not yet picked up the linezolid apparently phone issues. We are also trying to get a topical antibiotic out of [facility] in [place]  they can be applied by home health. She is still having green drainage 10/16; the patient has her topical antibiotic from [facility] in [place]. This is a compounded gel with vancomycin and ciprofloxacin and gentamicin. We are applying this on the wound bed with silver alginate over the top with Unna boot wraps. She arrives in clinic today with a lot less ominous looking drainage although she is only use this topical preparation once the second time today. She sees Dr. [name] her surgeon on Friday she has home health changing the dressing Electronic Signature(s) Signed: 03/27/2020 4:47:47 PM By: Linton Ham MD Entered By: Linton Ham on 03/27/2020 09:48:52 -------------------------------------------------------------------------------- Physical Exam Details Patient Name: Date of Service: [name], [name] 03/27/2020 9:00 A M Medical Record Number: [id number] Patient Account Number:  [id number] Date of Birth/Sex: Treating RN: Sep 13, 1955 (64 y.o. [name] Primary Care Provider: [name] Other Clinician: Referring Provider: Treating Provider/Extender: [name] in Treatment: 10 Constitutional Sitting or standing Blood Pressure is within target range for patient.. Pulse regular and within target range for patient.Marland Kitchen Respirations regular, non-labored and within target range.. Temperature is normal and within the target range for the patient.Marland Kitchen Appears in no distress. Notes Wound exam; left medial ankle. The most distal part of this still has gelatinous subcutaneous debris which I removed with a #5 curette and then fibrinous adherent debris over the rest of the wound although this is cleaning up quite nicely. The more distal part of this has depth but there is no palpable deep tissue no surrounding infection no erythema Electronic Signature(s) Signed: 03/27/2020 4:47:47 PM By: Linton Ham MD Entered By: Linton Ham on 03/27/2020 09:49:45 -------------------------------------------------------------------------------- Physician Orders Details Patient Name: Date of Service: [name], [name] 03/27/2020 9:00 A M Medical Record Number: [id number] Patient Account Number: [id number] Date of Birth/Sex: Treating RN: 08-Dec-1955 (64 y.o. [name], Meta.Reding Primary Care Provider: [name] Other Clinician: Referring Provider: Treating Provider/Extender: [name] in Treatment: 10 Verbal / Phone Orders: No Diagnosis Coding ICD-10 Coding Code Description [phone] Chronic venous hypertension (idiopathic) with ulcer and inflammation of left lower extremity L97.828 Non-pressure chronic ulcer of other part of left lower leg with other specified severity L97.328 Non-pressure chronic ulcer of left ankle with other specified severity Follow-up Appointments Return Appointment in 1 week. Dressing Change  Frequency Wound #15 Left,Medial Malleolus Change dressing three times week. - wound clinic to change on Mondays, home health to change on Wednesday and Friday Skin Barriers/Peri-Wound Care Moisturizing lotion - to leg Wound Cleansing May shower with protection. - use cast protector Primary Wound Dressing Wound #15 Left,Medial Malleolus lginate with Silver - Apply keystone topical antibiotic medication. Follow the direction on how to mix and apply per pharmacy directions. Calcium A Apply under alginate Ag. Patient to bring in keystone topical antibiotic medication to each wound care encounter. Secondary Dressing Wound #15 Left,Medial Malleolus Dry Gauze ABD pad Edema Control Unna Boot to Left Lower Extremity Avoid standing for long periods of time Elevate legs to the level of the heart or above for 30 minutes daily and/or when sitting, a frequency of: - throughout the day Off-Loading Other: - ensure no pressure to wound areas. Additional Orders / Instructions Other: - Linezolid antibiotics as prescribed. [place] skilled nursing for wound care. - [facility] home health to change twice a week Electronic Signature(s) Signed: 03/27/2020 4:47:47 PM By: Linton Ham MD Signed: 03/29/2020 5:24:43 PM By: Rolin Barry  Bobbi Entered By: Deon Pilling on 03/27/2020 09:33:38 -------------------------------------------------------------------------------- Problem List Details Patient Name: Date of Service: [facility] [name], [name] 03/27/2020 9:00 A M Medical Record Number: [id number] Patient Account Number: [id number] Date of Birth/Sex: Treating RN: [birth year] (64 y.o. [name] Primary Care Provider: [name] Other Clinician: Referring Provider: Treating Provider/Extender: [name] in Treatment: 10 Active Problems ICD-10 Encounter Code Description Active Date MDM Diagnosis I87.332 Chronic venous hypertension (idiopathic) with  ulcer and inflammation of left 01/17/2020 No Yes lower extremity L97.828 Non-pressure chronic ulcer of other part of left lower leg with other specified 01/17/2020 No Yes severity L97.328 Non-pressure chronic ulcer of left ankle with other specified severity 01/17/2020 No Yes Inactive Problems ICD-10 Code Description Active Date Inactive Date L03.116 Cellulitis of left lower limb 01/31/2020 01/31/2020 T81.31XD Disruption of external operation (surgical) wound, not elsewhere classified, subsequent 01/17/2020 01/17/2020 encounter Resolved Problems Electronic Signature(s) Signed: 03/27/2020 4:47:47 PM By: Linton Ham MD Entered By: Linton Ham on 03/27/2020 09:46:38 -------------------------------------------------------------------------------- Progress Note Details Patient Name: Date of Service: [name], [name] 03/27/2020 9:00 A M Medical Record Number: [id number] Patient Account Number: [id number] Date of Birth/Sex: Treating RN: [birth year] (64 y.o. [name] Primary Care Provider: [name] Other Clinician: Referring Provider: Treating Provider/Extender: [name] in Treatment: 10 Subjective History of Present Illness (HPI) the remaining wound is over the left medial ankle. Similar wound over the right medial ankle healed largely with use of Apligraf. Most recently we have been using Hydrofera Blue over this wound with considerable improvement. The patient has been extensively worked up in the past for her venous insufficiency and she is not a candidate for antireflux surgery although I have none of the details available currently. 08/24/14; considerable improvement today. About 50% of this wound areas now epithelialized. The base of the wound appears to be healthier granulation.as opposed to last week when she had deteriorated a considerable improvement 08/17/14; unfortunately the wound has regressed somewhat. The areas of  epithelialization from the superior aspect are not nearly as healthy as they were last week. The patient thinks her Hydrofera Blue slipped. 09/07/14; unfortunately the area has markedly regressed in the 2 weeks since I've seen this. There is an odor surrounding erythema. The healthy granulation tissue that we had at the base of the wound now is a dusky color. The nurse reports green drainage 09/14/14; the area looks somewhat better than last week. There is less erythema and less drainage. The culture I did did not show any growth. Nevertheless I think it is better to continue the Cipro and doxycycline for a further week. The remaining wound area was debridement. 09/21/14. Wound did not require debridement last week. Still less erythema and less drainage. She can complete her antibiotics. The areas of epithelialization in the superior aspect of the wound do not look as healthy as they did some weeks ago 10/05/14 continued improvement in the condition of this wound. There is advancing epithelialization. Less aggressive debridement required 10/19/14 continued improvement in the condition and volume of this wound. Less aggressive debridement to the inferior part of this to remove surface slough and fibrinous eschar 11/02/14 no debridement is required. The surface granulation appears healthy although some of her islands of epithelialization seem to have regressed. No evidence of infection 11/16/14; lites surface debridement done of surface eschar. The wound does not look to be unhealthy. No evidence of infection. Unfortunately the patient has had podiatry issues in  the right foot and for some reason has redeveloped small surface ulcerations in the medial right ankle. Her original presentation involved wounds in this area 11/23/14 no debridement. The area on the right ankle has enlarged. The left ankle wound appears stable in terms of the surface although there is periwound inflammation. There has been regression in  the amount of new skin 11/30/14 no debridement. Both wound areas appear healthy. There was no evidence of infection. The the new area on the right medial ankle has enlarged although that both the surfaces appear to be stable. 12/07/14; Debridement of the right medial ankle wound. No no debridement was done on the left. 12/14/14 no major change in and now bilateral medial ankle wounds. Both of these are very painful but the no overt evidence of infection. She has had previous venous ablation 12/21/14; patient states that her right medial ankle wound is considerably more painful last week than usual. Her left is also somewhat painful. She could not tolerate debridement. The right medial ankle wound has fibrinous surface eschar 12/28/14 this is a patient with severe bilateral venous insufficiency ulcers. For a considerable period of time we actually had the one on the right medial ankle healed however this recently opened up again in June. The left medial ankle wound has been a refractory area with some absent flows. We had some success with Hydrofera Blue on this area and it literally closed by 50% however it is recently opened up Foley. Both of these were debridement today of surface eschar. She tolerates this poorly 01/25/15: No change in the status of this. Thick adherent escar. Very poor tolerance of any attempt at debridement. I had healed the right medial malleolus wound for a considerable amount of time and had the left one down to about 50% of the volume although this is totally regressed over the last 48 weeks. Further the right leg has reopened. she is trying to make a appointment with pain and vascular, previous ablations with Dr. Aleda Grana. I do not believe there is an arterial insufficiency issue here 02/01/15 the status of the adherent eschar bilaterally is actually improved. No debridement was done. She did not manage to get vascular studies done 02/08/15 continued debridement of the area was  done today. The slough is less adherent and comes off with less pressure. There is no surrounding infection peripheral pulses are intact 02/15/15 selective debridement with a disposable curette. Again the slough is less adherent and comes off with less difficulty. No surrounding infection peripheral pulses are intact. 02/22/15 selective debridement of the right medial ankle wound. Slough comes off with less difficulty. No obvious surrounding infection peripheral pulses are intact I did not debridement the one on the left. Both of these are stable to improved 03/01/15 selective debridement of both wound areas using a curette to. Adherent slough cup soft with less difficulty. No obvious surrounding infection. The patient tells me that 2 days ago she noted a rash above the right leg wrap. She did not have this on her lower legs when she change this over she arrives with widespread left greater than right almost folliculitis-looking rash which is extremely pruritic. I don't see anything to culture here. There is no rash on the rest of her body. She feels well systemically. 03/08/15; selective debridement of both wounds using a curette. Base of this does not look unhealthy. She had limegreen drainage coming out of the left leg wound and describes a lot of drainage. The rash on her left  leg looks improved to. No cultures were done. 03/22/15; patient was not here last week. Basal wounds does not look healthy and there is no surrounding erythema. No drainage. There is still a rash on the left leg that almost looks vasculitic however it is clearly limited to the top of where the wrap would be. 04/05/15; on the right required a surgical debridement of surface eschar and necrotic subcutaneous tissue. I did not debridement the area on the left. These continue to be large open wounds that are not changing that much. We were successful at one point in healing the area on the right, and at the same time the area on the  left was roughly half the size of current measurements. I think a lot of the deterioration has to do with the prolonged time the patient is on her feet at work 04/19/15 I attempted-like surface debridement bilaterally she does not tolerate this. She tells me that she was in allergic care yesterday with extreme pain over her left lateral malleolus/ankle and was told that she has an "sprain" 05/03/15; large bilateral venous insufficiency wounds over the medial malleolus/medial aspect of her ankles. She complains of copious amounts of drainage and his usual large amounts of pain. There is some increasing erythema around the wound on the right extending into the medial aspect of her foot to. historically she came in with these wounds the right one healed and the left one came down to roughly half its current size however the right one is reopened and the left is expanded. 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 05/10/15 large bilateral venous insufficiency wounds. There is less adherence surface left however the surface culture that I did last week grew pseudomonas therefore bilateral selective debridement score necessary. There is surrounding erythema. The patient describes severe bilateral drainage and a lot of pain in the left ankle. Apparently her podiatrist was were ready to do a cortisone shot 05/17/15; the patient complains of pain and again copious amounts of drainage. 05/24/15; we used Iodo flex last week. Patient notes considerable improvement in wound drainage. Only needed to change this once. 05/31/15; we continued Iodoflex; the base of these large wounds bilaterally is not too bad but there is probably likely a significant bioburden here. I would like to debridement just doesn't tolerate it. 06/06/14 I would like to continue the Iodoflex although she still hasn't managed to obtain supplies. She has bilateral medial malleoli or large wounds  which are mostly superficial. Both of them are covered circumferentially with some nonviable fibrinous slough although she tolerates debridement very poorly. She apparently has an appointment for an ablation on the right leg by interventional radiology. 06/14/15; the patient arrives with the wounds and static condition. We attempted a debridement although she does not do well with this secondary to pain. I 07/05/15; wounds are not much smaller however there appears to be a cleaner granulating base. The left has tight fibrinous slough greater than the right. Debridement is tolerated poorly due to pain. Iodoflex is done more for these wounds in any of the multitude of different dressings I have tried on the left 1 and then subsequently the right. 07/12/15; no change in the condition of this wound. I am able to do an aggressive debridement on the right but not the left. She simply cannot tolerate it. We have been using Iodoflex which helps somewhat. It is worthwhile remembering that at  one point we healed the right medial ankle wound and the left was about 25% of the current circumference. We have suggested returning to vascular surgery for review of possible further ablations for one reason or another she has not been able to do this. 07/26/15 no major change in the condition of either wound on her medial ankle. I did not attempt to debridement of these. She has been aggressively scrubbing these while she is in the shower at home. She has her supply of Iodoflex which seems to have done more for these wounds then anything I have put on recently. 08/09/15 wound area appears larger although not verified by measurements. Using Iodoflex 09/05/2015 -- she was here for avisit today but had significant problems with the wound and I was asked to see her for a physician opinion. I have summarize that this lady has had surgery on her left lower extremity about 10 years ago where the possible veins stripping was done. She  has had an opinion from interventional radiology around November 2016 where no further sclerotherapy was ordered. The patient works 12 hours a day and stands on a concrete floor with work boots and is unable to get the proper compression she requires and cannot elevate her limbs appropriately at any given time. She has recently grown Pseudomonas from her wound culture but has not started her ciprofloxacin which was called in for her. 09/13/15 this continues to be a difficult situation for this patient. At one point I had this wound down to a 1.5 x 1.5" wound on her left leg. This is deteriorated and the right leg has reopened. She now has substantial wounds on her medial calcaneus, malleoli and into her lower leg. One on the left has surface eschar but these are far too painful for me to debridement here. She has a vascular surgery appointment next week to see if anything can be done to help here. I think she has had previous ablations several years ago at [facility]. She has no major edema. She tells me that she did not get product last time [facility] and went for several days without it. She continues to work in work boots 12 hours a day. She cannot get compression/4-layer under her work boots. 09/20/15 no major change. Periwound edema control was not very good. Her point with pain and vascular is next Wednesday the 25th 09/28/15; the patient is seen vascular surgery and is apparently scheduled for repeat duplex ultrasounds of her bilateral lower legs next week. 10/05/15; the patient was seen by Dr. Doren Custard of vascular surgery. He feels that she should have arterial insufficiency excluded as cause/contributed to her nonhealing stage she is therefore booked for an arteriogram. She has apparently monophasic signals in the dorsalis pedis pulses. She also of course has known severe chronic venous insufficiency with previous procedures as noted previously. I had another long discussion with the patient  today about her continuing to work 12 hour shifts. I've written her out for 2 months area had concerns about this as her work location is currently undergoing significant turmoil and this may lead to her termination. She is aware of this however I agree with her that she simply cannot continue to stand for 12 hours multiple days a week with the substantial wound areas she has. 10/19/15; the Dr. Doren Custard appointment was largely for an arteriogram which was normal. She does not have an arterial issue. He didn't make a comment about her chronic venous insufficiency for which she  has had previous ablations. Presumably it was not felt that anything additional could be done. The patient is now out of work as I prescribed 2 weeks ago. Her wounds look somewhat less aggravated presumably because of this. I felt I would give debridement another try today 10/25/15; no major change in this patient's wounds. We are struggling to get her product that she can afford into her own home through her insurance. 11/01/15; no major change in the patient's wounds. I have been using silver alginate as the most affordable product. I spoke to Dr. Marla Roe last week with her requested take her to the OR for surgical debridement and placement of ACEL. Dr. Marla Roe told me that she would be willing to do this however [facility] will not cover this, fortunately the patient has [place] healthcare of some variant 11/08/15; no major change in the patient's wounds. She has been completely nonviable surface that this but is in too much pain with any attempted debridement are clinic. I have arranged for her to see Dr. Marla Roe ham of plastic surgery and this appointment is on Monday. I am hopeful that they will take her to the OR for debridement, possible ACEL ultimately possible skin graft 11/22/15 no major change in the patient's wounds over her bilateral medial calcaneus medial malleolus into the lower legs. Surface on these  does not look too bad however on the left there is surrounding erythema and tenderness. This may be cellulitis or could him sleepy tinea. 11/29/15; no major changes in the patient's wounds over her bilateral medial malleolus. There is no infection here and I don't think any additional antibiotics are necessary. There is now plan to move forward. She sees Dr. Marla Roe in a week's time for preparation for operative debridement and ACEL placement I believe on 7/12. She then has a follow-up appointment with Dr. Marla Roe on 7/21 12/28/15; the patient returns today having been taken to the [place] by Dr. Marla Roe 12/12/15 she underwent debridement, intraoperative cultures [which were negative]. She had placement of a wound VAC. Parent really ACEL was not available to be placed. The wound VAC foam apparently adhered to the wound since then she's been using silver alginate, Xeroform under Ace wraps. She still says there is a lot of drainage and a lot of pain 01/31/16; this is a patient I see monthly. I had referred her to Dr. Marla Roe him of plastic surgery for large wounds on her bilateral medial ankles. She has been to the OR twice once in early July and once in early August. She tells me over the last 3 weeks she has been using the wound VAC with ACEL underneath it. On the right we've simply been using silver alginate. Under Kerlix Coban wraps. 02/28/16; this is a patient I'm currently seeing monthly. She is gone on to have a skin graft over her large venous insufficiency ulcer on the left medial ankle. This was done by Dr. Marla Roe him. The patient is a bit perturbed about why she didn't have one on her right medial ankle wound. She has been using silver alginate to this. 03/06/16; I received a phone call from her plastic surgery Dr. Marla Roe. She expressed some concern about the viability of the skin graft she did on the left medial ankle wound. Asked me to place Endoform on this. She told me she is not  planning to do a subsequent skin graft on the right as the left one did not take very well. I had placed Hydrofera  Blue on the right 03/13/16; continue to have a reasonably healthy wound on the right medial ankle. Down to 3 mm in terms of size. There is epithelialization here. The area on the left medial ankle is her skin graft site. I suppose the last week this looks somewhat better. She has an open area inferiorly however in the center there appears to be some viable tissue. There is a lot of surface callus and eschar that will eventually need to come off however none of this looked to be infected. Patient states that the is able to keep the dressing on for several days which is an improvement. 03/20/16 no major change in the circumference of either wound however on the left side the patient was at Dr. Eusebio Friendly office and they did a debridement of left wound. 50% of the wound seems to be epithelialized. I been using Endoform on the left Hydrofera Blue in the right 03/27/16; she arrives today with her wound is not looking as healthy as they did last week. The area on the right clearly has an adherent surface to this a very similar surface on the left. Unfortunately for this patient this is all too familiar problem. Clearly the Endoform is not working and will need to change that today that has some potential to help this surface. She does not tolerate debridement in this clinic very well. She is changing the dressing wants 04/03/16; patient arrives with the wounds looking somewhat better especially on the right. Dr. Migdalia Dk change the dressing to silver alginate when she saw her on Monday and also sold her some compression socks. The usefulness of the latter is really not clear and woman with severely draining wounds. 04/10/16; the patient is doing a bit of an experiment wearing the compression stockings that Dr. Migdalia Dk provided her to her left leg and the out of legs based dressings that we provided  to the right. 05/01/16; the patient is continuing to wear compression stockings Dr. Migdalia Dk provided her on the left that are apparently silver impregnated. She has been using Iodoflex to the right leg wound. Still a moderate amount of drainage, when she leaves here the wraps only last for 4 days. She has to change the stocking on the left leg every night 05/15/16; she is now using compression stockings bilaterally provided by Dr. Marla Roe. She is wearing a nonadherent layer over the wounds so really I don't think there is anything specific being done to this now. She has some reduction on the left wound. The right is stable. I think all healing here is being done without a specific dressing 06/09/16; patient arrives here today with not much change in the wound certainly in diameter to large circular wounds over the medial aspect of her ankle bilaterally. Under the light of these services are certainly not viable for healing. There is no evidence of surrounding infection. She is wearing compression stockings with some sort of silver impregnation as prescribed by Dr. Marla Roe. She has a follow-up with her tomorrow. 06/30/16; no major change in the size or condition of her wounds. These are still probably covered with a nonviable surface. She is using only her purchase stockings. She did see Dr. Marla Roe who seemed to want to apply Dakin's solution to this I'm not extreme short what value this would be. I would suggest Iodoflex which she still has at home. 07/28/16; I follow Mrs. Sow episodically along with Dr. Marla Roe. She has very refractory venous insufficiency wounds on her bilateral medial legs  left greater than right. She has been applying a topical collagen ointment to both wounds with Adaptic. I don't think Dr. Marla Roe is planning to take her back to the OR. 08/19/16; I follow Mrs. Jeneen Rinks on a monthly basis along with Dr. Marla Roe of plastic surgery. She has very refractory venous  insufficiency wounds on the bilateral medial lower legs left greater than right. I been following her for a number of years. At one point I was able to get the right medial malleolus wound to heal and had the left medial malleolus down to about half its current size however and I had to send her to plastic surgery for an operative debridement. Since then things have been stable to slightly improve the area on the right is slightly better one in the left about the same although there is much less adherent surface than I'm used to with this patient. She is using some form of liquid collagen gel that Dr. Marla Roe provided a Kerlix cover with the patient's own pressure stockings. She tells me that she has extreme pain in both ankles and along the lateral aspect of both feet. She has been unable to work for some period of time. She is telling me she is retiring at the beginning of April. She sees Dr. Doran Durand of orthopedics next week 09/22/16; patient has not seen Dr. Marla Roe since the last time she is here. I'm not really sure what she is using to the wounds other than bits and pieces of think she had left over including most recently Hydrofera Blue. She is using juxtalite stockings. She is having difficulty with her husband's recent illness "stroke". She is having to transport him to various doctors appointments. Dr. Marla Roe left her the option of a repeat debridement with ACEL however she has not been able to get the time to follow-up on this. She continues to have a fair amount of drainage out of these wounds with certainly precludes leaving dressings on all week 10/13/16; patient has not seen Dr. Marla Roe since she was last in our clinic. I'm not really sure what she is doing with the wounds, we did try to get her [facility] and I think she is actually using this most of the time. Because of drainage she states she has to change this every second day although this is an improvement from what  she used to do. She went to see Dr. Doran Durand who did not think she had a muscular issue with regards to her feet, he referred her to a neurologist and I think the appointment is sometime in June. I changed her back to Iodoflex which she has used in the past but not recently. 11/03/16; the patient has been using Iodoflex although she ran out of this. Still claims that there is a lot of drainage although the wound does not look like this. No surrounding erythema. She has not been back to see Dr. Marla Roe 11/24/16; the patient has been using Iodoflex again but she ran out of it 2 or 3 days ago. There is no major change in the condition of either one of these wounds in fact they are larger and covered in a thick adherent surface slough/nonviable tissue especially on the left. She does not tolerate mechanical debridement in our clinic. Going back to see Dr. Marla Roe of plastic surgery for an operative debridement would seem reasonable. 12/15/16; the patient has not been back to see Dr. Marla Roe. She is been dealing with a series of illnesses and her  husband which of monopolized her time. She is been using Sorbact which we largely supplied. She states the drainage is bad enough that it maximum she can go 2-3 days without changing the dressing 01/12/2017 -- the patient has not been back for about 4 weeks and has not seen Dr. Marla Roe not does she have any appointment pending. 01/23/17; patient has not seen Dr. Marla Roe even though I suggested this previously. She is using Santyl that was suggested last week by Dr. Con Memos this Cost her $16 through her insurance which is indeed surprising 02/12/17; continuing Santyl and the patient is changing this daily. A lot of drainage. She has not been back to see plastic surgery she is using an Ace wrap. Our intake nurse suggested wrap around stockings which would make a good reasonable alternative 02/26/17; patient is been using Santyl and changing this daily due to  drainage. She has not been to see plastic surgery she uses in April Ace wrap to control the edema. She did obtain extremitease stockings but stated that the edema in her leg was to big for these 03/20/17; patient is using Santyl and Anasept. Surfaces looked better today the area on the right is actually measuring a little smaller. She has states she has a lot of pain in her feet and ankles and is asking for a consult to pain control which I'll try to help her with through our case manager. 04/10/17; the patient arrives with better-looking wound surfaces and is slightly smaller wound on the left she is using a combination of Santyl and Anasept. She has an appointment or at least as started in the pain control center associated with [facility] 05/14/17; this is a patient who I followed for a prolonged period of time. 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 At one point I had this down to a much smaller wound on the left however these reopened and we've never been able to get these to heal. She has been using Santyl and Anasept gel although 2 weeks ago she ran out of the Anasept gel. She has a stable appearance of the wound. She is going to the wound care clinic at [facility]. They wanted do a nerve block/spinal block although she tells me she is reluctant to go forward with that. 05/21/17; this is a patient I have followed for many years. 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 Chronic pain and deformity in her ankles as well. She is been to see plastic surgery as well as orthopedics. Using PolyMem AG most recently/Kerramax/ABDs and 2 layer compression. She has managed to keep this on and she is coming in for a nurse check to change the dressing on Tuesdays, we see her on Fridays 06/05/17; really quite a good looking surface and the area especially on the right medial has contracted in terms of dimensions. Well granulated  healthy-looking tissue on both sides. Even with an open curet there is nothing that even feels abnormal here. This is as good as I've seen this in quite some time. We have been using PolyMem AG and bringing her in for a nurse check 06/12/17; really quite good surface on both of these wounds. The right medial has contracted a bit left is not. We've been using PolyMem and AG and she is coming in for a nurse visit 06/19/17; we have been using PolyMem AG and bringing her in for a nurse check. Dimensions of her wounds are not better but the  surfaces looked better bilaterally. She complained of bleeding last night and the left wound and increasing pain bilaterally. She states her wound pain is more neuropathic than just the wounds. There was some suggestion that this was radicular from her pain management doctor in talking to her it is really difficult to sort this out. 06/26/17; using PolyMem and AG and bringing her in for a nurse check as All of this and reasonably stable condition. Certainly not improved. The dimensions on the lateral part of the right leg look better but not really measuring better. The medial aspect on the left is about the same. 07/03/16; we have been using PolyMen AG and bringing her in for a nurse check to change the dressings as the wounds have drainage which precludes once weekly changing. We are using all secondary absorptive dressings.our intake nurse is brought up the idea of using a wound VAC/snap VAC on the wound to help with the drainage to see if this would result in some contraction. This is not a bad idea. The area on the right medial is actually looking smaller. Both wounds have a reasonable-looking surface. There is no evidence of cellulitis. The edema is well controlled 07/10/17; the patient was denied for a snap VAC by her insurance. The major issue with these wounds continues to be drainage. We are using wicked PolyMem AG and she is coming in for a nurse visit to change  this. The wounds are stable to slightly improved. The surface looks vibrant and the area on the right certainly has shrunk in size but very slowly 07/17/17; the patient still has large wounds on her bilateral medial malleoli. Surface of both of these wounds looks better. The dimensions seem to come and go but no consistent improvement. There is no epithelialization. We do not have options for advanced treatment products due to insurance issues. They did not approve of the wound VAC to help control the drainage. More recently we've been using PolyMem and AG wicked to allow drainage through. We have been bringing her in for a nurse visit to change this. We do not have a lot of options for wound care products and the home again due to insurance issues 07/24/17; the patient's wound actually looks somewhat better today. No drainage measurements are smaller still healthy-looking surface. We used silver collagen under PolyMen started last week. We have been bringing her in for a dressing change 07/31/17; patient's wound surface continued to look better and I think there is visible change in the dimensions of the wound on the right. Rims of epithelialization. We have been using silver collagen under PolyMen and bringing her in for a dressing change. There appears to be less drainage although she is still in need of the dressing change 08/07/17. Patient's wound surface continues to look better on both sides and the area on the right is definitely smaller. We have been using silver collagen and PolyMen. She feels that the drainage has been it has been better. I asked her about her vascular status. She went to see Dr. Aleda Grana at [facility] and had some form of ablation. I don't have much detail on this. I haven't my notes from 2016 that she was not a candidate for any further ablation but I don't have any more information on this. We had referred her to vein and vascular I don't think she ever went. He does not  have a history of PAD although I don't have any information on this either. We don't  even have ABIs in our record 08/14/17; we've been using silver collagen and PolyMen cover. And putting the patient and compression. She we are bringing her in as a nurse visit to change this because ofarge amount of drainage. We didn't the ABIs in clinic today since they had been done in many moons 1.2 bilaterally. She has been to see vein and vascular however this was at [facility] and she had ablation although I really don't have any information on this all seemed biking get a report. She is also been operatively debrided by plastic surgery and had a cell placed probably 8-12 months ago. This didn't have a major effect. We've been making some gains with current dressings 08/19/17-She is here in follow-up evaluation for bilateral medial malleoli ulcers. She continues to tolerate debridement very poorly. We will continue with recently changed topical treatment; if no significant improvement may consider switching to Iodosorb/Iodoflex. 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 She has been using silver collagen and PolyMem. I believe she has been used and tried on Iodoflex before. 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 These have been very refractory over time. We have been using silver collagen and PolyMen. She is coming in once a week for a doctor's and once a week for nurse visits. We are actually making some progress 09/18/17; the patient's wounds are smaller especially on the right medial. She arrives today to upset to consider even washing these off with Anasept which I think is been part of the reason this is been closing. We've been using collagen covered in PolyMen  otherwise. It is noted that she has a small area of folliculitis on the right medial calf that. As we are wrapping her legs I'll give her a short course of doxycycline to make sure this doesn't amount to anything. She is a long list of complaints today including imbalance, shortness of breath on exertion, inversion of her left ankle. With regards to the latter complaints she is been to see orthopedics and they offered her a tendon release surgery I believe but wanted her wounds to be closed first. I have recommended she go see her primary physician with regards to everything else. 09/25/17; patient's wounds are about the same size. We have made some progress bilaterally although not in recent weeks. She will not allow me T wash these wounds with Anasept even if she is doing her cell. Wheeze we've been using collagen covered in PolyMen. Last week she had a small area of folliculitis this is now opened into a small wound. She completed 5 days of trimethoprim sulfamethoxazole 10/02/17; unfortunately the area on her left medial ankle is worse with a larger wound area towards the Achilles. The patient complains of a lot of pain. She will not allow debridement although visually I don't think there is anything to debridement in any case. We have been using silver collagen and PolyMen for several months now. Initially we are making some progress although I'm not really seeing that today. We will move back to [facility]. His admittedly this is a bit of a repeat however I'm hoping that his situation is different now. The patient tells me she had her leg on the left give out on her yesterday this is process some pain. 10/09/17;  the patient is seen twice a week largely because of drainage issues coming out of the chronic medial bimalleolar wounds that are chronic. Last week the dimensions of the one on the left looks a little larger I changed her to [facility]. She comes in today with a history of terrible  pain in the bilateral wound areas. She will not allow debridement. She will not even allow a tissue culture. There is no surrounding erythema no no evidence of cellulitis. We have been putting her Kerlix Coban man. She will not allow more aggressive compression as there was a suggestion to put her in 3 layer wraps. 10/16/17; large wounds on her bilateral medial malleoli. These are chronic. Not much change from last week. The surface looks have healthy but absolutely no epithelialization. A lot of pain little less so of drainage. She will not allow debridement or even washing these off in the vigorous fashion with Anasept. 10/23/17; large wounds on her bilateral malleoli which are chronic. Some improvement in terms of size perhaps on the right since last time I saw these. She states that after we increased the 3 layer compression there was some bleeding, when she came in for a nurse visit she did not want 3 layer compression put back on about our nurse managed to convince her. She has known chronic venous visit issues and I'm hoping to get her to tolerate the 3 layer compression. using Hydrofera Blue 10/30/17; absolutely no change in the condition of either wound although we've had some improvement in dimensions on the right.. Attempted to put her in 3 layer compression she didn't tolerated she is back in 2 layer compression. We've been using Hydrofera Blue We looked over her past records. She had venous reflux studies in November 2016. There was no evidence of deep venous reflux on the right. Superficial vein did not show the greater saphenous vein at think this is been previously ablated the small saphenous vein was within normal limits. The left deep venous system showed no DVT the vessels were positive for deep venous reflux in the posterior tibial veins at the ankle. The greater saphenous vein was surgically absent small saphenous vein was within normal limits. She went to vein and vascular at  [facility]. I believe she had an ablation on the left greater saphenous vein. I'll update her reflux studies perhaps ever reviewed by vein and vascular. We've made absolutely no progress in these wounds. Will also try to read and TheraSkins through her insurance 11/06/17; W the patient apparently has a 2 week follow-up with vein and vascular I like him to review the whole issue with regards to her previous vascular workup by Dr. Aleda Grana. We've really made no progress on these wounds in many months. She arrives today with less viable looking surface on the left medial ankle wound. This was apparently looking about the same on Tuesday when she was here for nurse visit. 11/13/17; deep tissue culture I did last time of the left lower leg showed multiple organisms without any predominating. In particular no Staphylococcus or group A strep were isolated. We sent her for venous reflux studies. She's had a previous left greater saphenous vein stripping and I think sclerotherapy of the right greater saphenous vein. She didn't really look at the lesser saphenous vein this both wounds are on the medial aspect. She has reflux in the common femoral vein and popliteal vein and an accessory vein on the right and the common femoral vein and popliteal vein  on the left. I'm going to have her go to see vein and vascular just the look over things and see if anything else beside aggressive compression is indicated here. We have not been able to make any progress on these wounds in spite of the fact that the surface of the wounds is never look too bad. 11/20/17; no major change in the condition of the wounds. Patient reports a large amount of drainage. She has a lot of complaints of pain although enlisting her today I wonder if some of this at least his neuropathic rather than secondary to her wounds. She has an appointment with vein and vascular on 12/30/17. The refractory nature of these wounds in my mind at least  need vein and vascular to look over the wounds the recent reflux studies we did and her history to see if anything further can be done here. I also note her gait is deteriorated quite a bit. 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 She has a bilateral Trendelenburg gait. I wonder if this is neuropathic or perhaps multilevel radicular. 11/27/17; her wounds actually looks slightly better. Healthy-looking granulation tissue a scant amount of epithelialization. [place] healthcare will not pay for [employer]. They will play for tri layer Oasis and Dermagraft. This is not a diabetic ulcer. We'll try for the tri layer Oasis. She still complains of some drainage. She has a vein and vascular appointment on 12/30/17 12/04/17; the wounds visually look quite good. Healthy-looking granulation with some degree of epithelialization. We are still waiting for response to our request for trial to try layer Oasis. Her appointment with vascular to review venous and arterial issues isn't sold the end of July 7/31. Not allow debridement or even vigorous cleansing of the wound surface. 12/18/17; slightly smaller especially on the right. 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 We've been using Hydrofera Blue. We still are looking into triple layer Oasis through her insurance 01/08/18 on evaluation today patient's wound actually appears to be showing signs of good improvement at this point in time. She has been tolerating the dressing changes without complication. Fortunately there does not appear to be any evidence of infection at this point in time. We have been utilizing silver nitrate which does seem to be of benefit for her which is also good news. Overall I'm very happy with how things seem to be both regards appearance as well as measurement. Patient did see Dr. Bridgett Larsson for evaluation on 12/30/17. In his assessment he felt that stripping would not likely add much more than chronic  compression to the patient's healing process. His recommendation was to follow-up in three months with Dr. Doren Custard if she hasn't healed in order to consider referral back to you and see vascular where she previously was in a trial and was able to get her wound to heal. I'll be see what she feels she when you staying compression and he reiterated this as well. 01/13/18 on evaluation today patient appears to actually be doing very well in regard to her bilateral medial malleolus ulcers. She seems to have tolerated the chemical cauterization with silver nitrate last week she did have some pain through that evening but fortunately states that I'll be see since it seems to be doing better she is overall pleased with the progress. 01/21/18; really quite a remarkable improvement since I've last seen these wounds. We started using silver nitrate specially on the islands of hyper granulation which for some reason her around  the wound circumference. This is really done quite nicely. Primary dressing Hydrofera Blue under 4 layer compression. She seems to be able to hold out without a nurse rewrap. Follow-up in 1 week 01/28/18; we've continued the hydrofera blue but continued with chemical cauterization to the wound area that we started about a month ago for irregular hyper granulation. She is made almost stunning improvement in the overall wound dimensions. I was not really expecting this degree of improvement in these chronic wounds 02/05/18; we continue with Hydrofera Bluebut of also continued the aggressive chemical cauterization with silver nitrate. We made nice progress with the right greater than left wound. 02/12/18. We continued with Hydrofera Blue after aggressive chemical cauterization with silver nitrate. We appear to be making nice progress with both wound areas 02/19/2018; we continue with [facility] after washing the wounds vigorously with Anasept spray and chemical cauterization with silver nitrate.  We are making excellent progress. The area on the right's just about closed 02/26/2018. The area on the left medial ankle had too much necrotic debris today. I used a #5 curette we are able to get most of the soft. I continued with the silver nitrate to the much smaller wound on the right medial ankle she had a new area on her right lower pretibial area which she says was due to a role in her compression 03/05/2018; both wound areas look healthy. Not much change in dimensions from last week. I continue to use silver nitrate and Hydrofera Blue. The patient saw Dr. Doren Custard of vein and vascular. He felt she had venous stasis ulcers. He felt based on her previous arteriogram she should have adequate circulation for healing. Also she has deep venous reflux but really no significant correctable superficial venous reflux at this time. He felt we should continue with conservative management including leg elevation and compression 04/02/2018; since we last saw this woman about a month ago she had a fall apparently suffered a pelvic fracture. I did not look up the x-ray. Nevertheless because of pain she literally was bedbound for 2 weeks and had home health coming out to change the dressing. Somewhat predictably this is resulted in considerable improvement in both wound areas. The right is just about closed on the medial malleolus and the left is about half the size. 04/16/2018; both her wounds continue to go down in size. Using Hydrofera Blue. 05/07/18; both her wounds appeared to be improving especially on the right where it is almost closed. We are using Hydrofera Blue 05/14/2018; slightly worse this week with larger wounds. Surface on the left medial not quite as good. We have been using Hydrofera Blue 05/21/18; again the wounds are slightly larger. Left medial malleolus slightly larger with eschar around the circumference. We have been using Hydrofera Blue undergoing a wraps for a prolonged period of time. This  got a lot better when she was more recumbent due to a fall and a back injury. I change the primary dressing the silver alginate today. She did not tolerate a 4 layer compression previously although I may need to bring this up with her next time 05/28/2018; area on the left medial malleolus again is slightly larger with more drainage. Area on the right is roughly unchanged. She has a small area of folliculitis on the right medial just on the lower calf. This does not look ominous. 06/03/2018 left medial malleolus slightly smaller in a better looking surface. We used silver nitrate on this last time with silver alginate. The area  on the right appears slightly smaller 1/10; left medial malleolus slightly smaller. Small open area on the right. We used silver nitrate and silver alginate as of 2 weeks ago. We continue with the wound and compression. These got a lot better when she was off her feet 1/17; right medial malleolus wound is smaller. The left may be slightly smaller. Both surfaces look somewhat better. 1/24; both wounds are slightly smaller. Using silver alginate under Unna boots 1/31; both wounds appear smaller in fact the area on the right medial is just about closed. Surface eschar. We have been using silver alginate under Unna boots. The patient is less active now spends let much less time on her feet and I think this is contributed to the general improvement in the wound condition 2/7; both wounds appear smaller. I was hopeful the right medial would be closed however there there is still the same small open area. Slight amount of surface eschar on the left the dimensions are smaller there is eschar but the wound edges appear to be free. We have been using silver alginate under Unna boot's 2/14; both wounds once again measure smaller. Circumferential eschar on the left medial. We have been using silver alginate under Unna boots with gradual improvement 2/21; the area on the right medial  malleolus has healed. The area on the left is smaller. We have been using silver alginate and Unna boots. We can discharge wrapping the right leg she has 20/30 stockings at home she will need to protect the scar tissue in this area 2/28; the area on the right medial malleolus remains closed the patient has a compression stocking. The area on the left is smaller. We have been using silver alginate and Unna boots. 3/6 the area on the right medial ankle remains closed. Good edema control noted she is using her own compression stocking. The area on the left medial ankle is smaller. We have been managing this with silver alginate and Unna boots which we will continue today. 3/13; the area on the right medial ankle remains closed and I'm declaring it healed today. When necessary the left is about the same still a healthy-looking surface but no major change and wound area. No evidence of infection and using silver alginate under unna and generally making considerable improvement 3/27 the area on the right medial ankle remains closed the area on the left is about the same as last week. Certainly not any worse we have been using silver alginate under an Unna boot 4/3; the area on the right medial ankle remains closed per the patient. We did not look at this wound. The wound on the left medial ankle is about the same surface looks healthy we have been using silver alginate under an Unna boot 4/10; area on the right medial ankle remains closed per the patient. We did not look at this wound. The wound on the left medial ankle is slightly larger. The patient complains that the [facility] caused burning pain all week. She also told us that she was a lot more active this week. Changed her back to silver alginate 4/17; right medial ankle still closed per the patient. Left medial ankle is slightly larger. Using silver alginate. She did not tolerate Hydrofera Blue on this area 4/24; right medial ankle remains  closed we have not look at this. The left medial ankle continues to get larger today by about a centimeter. We have been using silver alginate under Unna boots. She complains about 4  layer compression as an alternative. She has been up on her feet working on her garden 5/8; right medial ankle remains closed we did not look at this. The left medial ankle has increased in size about 100%. We have been using silver alginate under Unna boots. She noted increased pain this week and was not surprised that the wound is deteriorated 5/15; no major change in SA however much less erythema ( one week of doxy ocellulitis). 5/22-64 year old female returns at 1 week to the clinic for left medial ankle wound for which we have been using silver alginate under 3 layer compression She was placed on DOXY at last visit - the wound is wider at this visit. She is in 3 layer compression 5/29; change to [facility] last week. I had given her empiric doxycycline 2 weeks ago for a week. She is in 3 layer compression. She complains of a lot of pain and drainage on presentation today. 6/5; using Hydrofera Blue. I gave her doxycycline recently empirically for erythema and pain around the wound. Believe her cultures showed enterococcus which not would not have been well covered by doxycycline nevertheless the wound looks better and I don't feel specifically that the enterococcus needs to be covered. She has a new what looks like a wrap injury on her lateral left ankle. 6/12; she is using Hydrofera Blue. She has a new area on the left anterior lower tibial area. This was a wrap injury last week. 6/19; the patient is using Hydrofera Blue. She arrived with marked inflammation and erythema around the wound and tenderness. 12/01/18 on evaluation today patient appears to be doing a little bit better based on what I'm hearing from the standpoint of lassos evaluation to this as far as the overall appearance of the wound is concerned.  Then sometime substandard she typically sees Dr. Dellia Nims. Nonetheless overall very pleased with the progress that she's made up to this point. No fevers, chills, nausea, or vomiting noted at this time. 7/10; some improvement in the surface area. Aggressively debrided last week apparently. I went ahead with the debridement today although the patient does not tolerate this very well. We have been using Iodoflex. Still a fair amount of drainage 7/17; slightly smaller. Using Iodoflex. 7/24; no change from last week in terms of surface area. We have been using Iodoflex. Surface looks and continues to look somewhat better 7/31; surface area slightly smaller better looking surface. We have been using Iodoflex. This is under Unna boot compression 8/7-Patient presents at 1 week with Unna boot and Iodoflex, wound appears better 8/14-Patient presents at 1 week with Iodoflex, we use the Unna boot, wound appears to be stable better.Patient is getting Botox treatment for the inversion of the foot for tendon release, Next week 8/21; we are using Iodoflex. Unna boot. The wound is stable in terms of surface area. Under illumination there is some areas of the wound that appear to be either epithelialized or perhaps this is adherent slough at this point I was not really clear. It did not wipe off and I was reluctant to debride this today. 8/28; we are using Iodoflex in an Unna boot. Seems to be making good improvement. 9/4; using Iodoflex and wound is slightly smaller. 9/18; we are using Iodoflex with topical silver nitrate when she is here. The wound continues to be smaller 10/2; patient missed her appointment last week due to GI issues. She left and Iodoflex based dressing on for 2 weeks. Wound is about the same  size about the size of a dime on the left medial lower 10/9 we have been using Iodoflex on the medial left ankle wound. She has a new superficial probable wrap injury on the dorsal left ankle 10/16; we have  been using Hydrofera Blue since last week. This is on the left medial ankle 10/23; we have been using Hydrofera Blue since 2 weeks ago. This is on the left medial ankle. Dimensions are better 11/6; using Hydrofera Blue. I think the wound is smaller but still not closed. Left medial ankle 11/13; we have been using Hydrofera Blue. Wound is certainly no smaller this week. Also the surface not as good. This is the remanent of a very large area on her left medial ankle. 11/20; using Sorbact since last week. Wound was about the same in terms of size although I was disappointed about the surface debris 12/11; 3-week follow-up. Patient was on vacation. Wound is measuring slightly larger we have been using Sorbact. 12/18; wound is about the same size however surface looks better last week after debridement. We have been using Sorbact under compression 1/15 wound is probably twice the size of last time increased in length nonviable surface. We have been using Sorbact. She was running a mild fever and missed her appointment last week 1/22; the wound is come down in size but under illumination still a very adherent debris we have been Hydrofera Blue that I changed her to last week 1/29; dimensions down slightly. We have been using Hydrofera Blue 2/19 dimensions are the same however there is rims of epithelialization under illumination. Therefore more the surface area may be epithelialized 2/26; the patient's wound actually measures smaller. The wound looks healthy. We have been using Hydrofera Blue. I had some thoughts about running Apligraf then I still may do that however this looks so much better this week we will delay that for now 3/5; the wound is small but about the same as last week. We have been using Hydrofera Blue. No debridement is required today. 3/19; the wound is about the size of a dime. Healthy looking wound even under illumination. We have been using Hydrofera Blue. No mechanical  debridement is necessary 3/26; not much change from last week although still looks very healthy. We have been using Hydrofera Blue under Unna boots Patient was offered an ankle fusion by podiatry but not until the wound heals with a proceed with this. 4/9; the patient comes in today with her original wound on the medial ankle looking satisfactory however she has some uncontrolled swelling in the middle part of her leg with 2 new open areas superiorly just lateral to the tibia. I think this was probably a wrap issue. She said she felt uncomfortable during the week but did not call in. We have been using Hydrofera Blue 4/16; the wound on the medial ankle is about the same. She has innumerable small areas superior to this across her mid tibia. I think this is probably folliculitis. She is also been working in the yard doing a lot of sweating 4/30; the patient issue on the upper areas across her mid tibia of all healed. I think this was excessive yard work if I remember. Her wound on the medial ankle is smaller. Some debris on this we have been using Hydrofera Blue under Unna boots 5/7; mid tibia. She has been using Hydrofera Blue under an Unna wrap. She is apparently going for her ankle surgery on June 3 10/28/19-Patient returns to clinic with the  ankle wound, we are using Hydrofera Blue under Unna wrap, surgery is scheduled for her left foot for June 3 so she will be back for nurse visit next week READMISSION 01/17/2020 Mrs. Carre is a 64 year old woman we have had in this clinic for a long period of time with severe venous hypertension and refractory wounds on her medial lower legs and ankles bilaterally. 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 When she was off her legs for a prolonged period example when she fell and suffered a compression fracture things would heal up quite  nicely. She is now retired and we managed to heal up the right medial leg wound. The left one was very tiny last time I saw this although still refractory. 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 It got to the point where this was interfering with her walking and she elected to proceed with a ankle arthrodesis to straighten her her ankle and leave her with a functional outcome for mobilization. The patient was referred to Dr. Doren Custard and really this took some time to arrange. Dr. Doren Custard saw her on 12/07/2019. Once again he verified that she had no arterial issues. She had previously had an angiogram several years ago. Follow-up ABIs on the left showed an ABI of 1.12 with triphasic waveforms and a TBI of 0.92. She is felt to have chronic deep venous insufficiency but I do not think it was felt that anything could be done from about this from an ablation point of view. At the time Dr. Doren Custard saw this patient the wounds actually look closed via the pictures in his clinic. The patient finally underwent her surgery on 12/15/2019. This went reasonably well and there was a good anatomic outcome. She developed a small distal wound dehiscence on the lateral part of the surgical wound. However more problematically she is developed recurrence of the wound on the medial left ankle. There are actually 2 wounds here one in the distal lower leg and 1 pretty much at the level of the medial malleolus. It is a more distal area that is more problematic. She has been using Hydrofera Blue which started on Friday before this she was simply Ace wrapping. There was a culture done that showed Pseudomonas and she is on ciprofloxacin. A recent CNS on 8/11 was negative. The patient reports some pain but I generally think this is improving. She is using a cam boot completely nonweightbearing using a walker for pivot transfers and a  wheelchair 8/24; not much improvement unfortunately she has a surgical wound on the lateral part in the venous insufficiency wound medially. The bottom part of the medial insufficiency wound is still necrotic there is exposed tendon here. We have been using Hydrofera Blue under compression. Her edema control is however better 8/31; patient in for follow-up of his surgical wound on the lateral part of her left leg and chronic venous insufficiency ulcers medially. We put her back in compression last week. She comes in today with a complaint of 3 or 4 days worth of increasing pain. She felt her cam walker was rubbing on the area on the back of her heel. However there is intense erythema seems more likely she has cellulitis. She had 2 cultures done when she was seeing podiatry in the  postop. One of them in late July showed Pseudomonas and she received a course of ciprofloxacin the other was negative on 8/11 she is allergic to penicillin with anaphylactoid complaints of hives oral swelling via information in epic 9/9; when I saw this patient last week she had intense anterior erythema around her wound on the right lateral heel and ankle and also into the right medial heel. Some of this was no doubt drainage and her walker boot however I was convinced she had cellulitis. I gave her Levaquin and Bactrim she is finishing up on this now. She is following up with Dr. [name] he saw her yesterday. He is taken her out of the walking boot of course she is still nonweightbearing. Her x-ray was negative for any worrisome features such as soft tissue air etc. Things are a lot better this week. She has home health. We have been using Hydrofera Blue under an [employer] which she put back on yesterday. I did not wrap her last week 9/17; her surrounding skin looks a lot better. In fact the area on the left lateral ankle has just a scant amount of eschar. The only remaining wound is the large area on the left medial ankle.  Probably about 60% of this is healthy granulation at the surface however she has a significant divot distally. This has adherent debris in it. I been using debridement and silver collagen to try and get this area to fill-in although I do not think we have made much progress this week 9/24; the patient's wound on the left medial ankle looks a lot better. The deeper divot area distally still requires debridement but this is cleaning up quite nicely we have been using silver collagen. The patient is complaining of swelling in her foot and is worried that that is contributing to the nonhealing of the ankle wound. She is also complaining of numbness in her anterior toes 10/4; left medial ankle. The small area distally still has a divot with necrotic material that I have been debriding away. This has an undermining area. She is approved for Apligraf. She saw Dr. [name] her surgeon on 10/1. I think he declared himself is satisfied with the condition of things. Still nonweightbearing till the end of the month. 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 Her surgical wound is well closed. There is no evidence of infection 10/11; the patient arrived in clinic today with the expectation that we be able to put an Apligraf on this area after debridement however she arrives with a relatively large amount of green drainage on the dressing. The patient states that this started on Friday. She has not been systemically unwell. 10/19; culture I did last week showed both Enterococcus and Pseudomonas. I think this came in separate parts because I stopped her ciprofloxacin I gave her and prescribed her linezolid however now looking at the final culture result this is Pseudomonas which is resistant to quinolones. She has not yet picked up the linezolid apparently phone issues. We are also trying to get a topical antibiotic out of [facility] in [place] they can be applied by home health. She  is still having green drainage 10/16; the patient has her topical antibiotic from [facility] in [place]. This is a compounded gel with vancomycin and ciprofloxacin and gentamicin. We are applying this on the wound bed with silver alginate over the top with Unna boot wraps. She arrives in clinic today with a lot less ominous  looking drainage although she is only use this topical preparation once the second time today. She sees Dr. [name] her surgeon on Friday she has home health changing the dressing Objective Constitutional Sitting or standing Blood Pressure is within target range for patient.. Pulse regular and within target range for patient.Marland Kitchen Respirations regular, non-labored and within target range.. Temperature is normal and within the target range for the patient.Marland Kitchen Appears in no distress. Vitals Time Taken: 9:09 AM, Height: 68 in, Temperature: 98.0 F, Pulse: 67 bpm, Respiratory Rate: 18 breaths/min, Blood Pressure: 123/76 mmHg. General Notes: Wound exam; left medial ankle. The most distal part of this still has gelatinous subcutaneous debris which I removed with a #5 curette and then fibrinous adherent debris over the rest of the wound although this is cleaning up quite nicely. The more distal part of this has depth but there is no palpable deep tissue no surrounding infection no erythema Integumentary (Hair, Skin) Wound #15 status is Open. Original cause of wound was Gradually Appeared. The wound is located on the Left,Medial Malleolus. The wound measures 4.7cm length x 1.6cm width x 0.7cm depth; 5.906cm^2 area and 4.134cm^3 volume. There is tendon and Fat Layer (Subcutaneous Tissue) exposed. There is no tunneling or undermining noted. There is a medium amount of purulent drainage noted. The wound margin is flat and intact. There is large (67-100%) red, pink granulation within the wound bed. There is a small (1-33%) amount of necrotic tissue within the wound bed including Adherent  Slough. 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 There was a Excisional Skin/Subcutaneous Tissue Debridement with a total area of 7.52 sq cm performed by [name]., MD. With the following instrument(s): Curette to remove Viable and Non-Viable tissue/material. Material removed includes Subcutaneous Tissue, Skin: Dermis, and Fibrin/Exudate after achieving pain control using Lidocaine 5% topical ointment. A time out was conducted at 09:35, prior to the start of the procedure. A Minimum amount of bleeding was controlled with Pressure. The procedure was tolerated well with a pain level of 0 throughout and a pain level of 0 following the procedure. Post Debridement Measurements: 4.7cm length x 1.6cm width x 0.7cm depth; 4.134cm^3 volume. Character of Wound/Ulcer Post Debridement is improved. Severity of Tissue Post Debridement is: Fat layer exposed. Post procedure Diagnosis Wound #15: Same as Pre-Procedure Pre-procedure diagnosis of Wound #15 is a Venous Leg Ulcer located on the Left,Medial Malleolus . There was a Haematologist Compression Therapy Procedure by Deon Pilling, RN. Post procedure Diagnosis Wound #15: Same as Pre-Procedure Plan Follow-up Appointments: Return Appointment in 1 week. Dressing Change Frequency: Wound #15 Left,Medial Malleolus: Change dressing three times week. - wound clinic to change on Mondays, home health to change on Wednesday and Friday Skin Barriers/Peri-Wound Care: Moisturizing lotion - to leg Wound Cleansing: May shower with protection. -  use cast protector Primary Wound Dressing: Wound #15 Left,Medial Malleolus: Calcium Alginate with Silver - Apply keystone topical antibiotic medication. Follow the direction on how to mix and apply per pharmacy directions. Apply under alginate Ag. Patient to bring in keystone topical antibiotic medication to each wound care encounter. Secondary Dressing: Wound #15 Left,Medial Malleolus: Dry Gauze ABD  pad Edema Control: Unna Boot to Left Lower Extremity Avoid standing for long periods of time Elevate legs to the level of the heart or above for 30 minutes daily and/or when sitting, a frequency of: - throughout the day Off-Loading: Other: - ensure no pressure to wound areas. Additional Orders / Instructions: Other: - Linezolid antibiotics as prescribed. Home Health: [facility] for wound care. - [facility] home [facility] to change twice a week 1. Her compounded antibiotic which includes vancomycin gentamicin and ciprofloxacin and a gel with silver alginate. 2. I am expecting to order Apligraf next week or at the latest the week after 3. After debridement today the wound bed really cleans up quite nicely. Drainage is less and the color of the drainage certainly less ominous Electronic Signature(s) Signed: 03/27/2020 4:47:47 PM By: Linton Ham MD Entered By: Linton Ham on 03/27/2020 09:50:52 -------------------------------------------------------------------------------- SuperBill Details Patient Name: Date of Service: [name], [name] Kingfisher G. 03/27/2020 Medical Record Number: [id number] Patient Account Number: [id number] Date of Birth/Sex: Treating RN: 13-Sep-1955 (64 y.o. [name], [name] Primary Care Provider: [name] Other Clinician: Referring Provider: Treating Provider/Extender: [name] in Treatment: 10 Diagnosis Coding ICD-10 Codes Code Description [phone] Chronic venous hypertension (idiopathic) with ulcer and  inflammation of left lower extremity L97.828 Non-pressure chronic ulcer of other part of left lower leg with other specified severity L97.328 Non-pressure chronic ulcer of left ankle with other specified severity Facility Procedures CPT4 Code: [id number] Description: 91694 - DEB SUBQ TISSUE 20 SQ CM/< ICD-10 Diagnosis Description L97.828 Non-pressure chronic ulcer of other part of left lower leg with other specified Modifier: severity Quantity: 1 Physician Procedures : CPT4 Code Description Modifier [id number] - WC PHYS SUBQ TISS 20 SQ CM ICD-10 Diagnosis Description L97.828 Non-pressure chronic ulcer of other part of left lower leg with other specified severity Quantity: 1 Electronic Signature(s) Signed: 03/27/2020 4:47:47 PM By: Linton Ham MD Entered By: Linton Ham on 03/27/2020 09:51:04

## 2020-03-30 ENCOUNTER — Encounter: Payer: Self-pay | Admitting: Podiatry

## 2020-03-30 ENCOUNTER — Other Ambulatory Visit: Payer: Self-pay

## 2020-03-30 ENCOUNTER — Ambulatory Visit (INDEPENDENT_AMBULATORY_CARE_PROVIDER_SITE_OTHER): Payer: Medicare Other | Admitting: Podiatry

## 2020-03-30 ENCOUNTER — Ambulatory Visit (INDEPENDENT_AMBULATORY_CARE_PROVIDER_SITE_OTHER): Payer: Medicare Other

## 2020-03-30 DIAGNOSIS — Z9889 Other specified postprocedural states: Secondary | ICD-10-CM

## 2020-03-30 DIAGNOSIS — R600 Localized edema: Secondary | ICD-10-CM

## 2020-03-30 DIAGNOSIS — L97322 Non-pressure chronic ulcer of left ankle with fat layer exposed: Secondary | ICD-10-CM

## 2020-03-30 DIAGNOSIS — M21542 Acquired clubfoot, left foot: Secondary | ICD-10-CM

## 2020-04-03 ENCOUNTER — Encounter (HOSPITAL_BASED_OUTPATIENT_CLINIC_OR_DEPARTMENT_OTHER): Payer: Medicare Other | Attending: Internal Medicine | Admitting: Internal Medicine

## 2020-04-03 ENCOUNTER — Other Ambulatory Visit: Payer: Self-pay

## 2020-04-03 DIAGNOSIS — L97328 Non-pressure chronic ulcer of left ankle with other specified severity: Secondary | ICD-10-CM | POA: Diagnosis not present

## 2020-04-03 DIAGNOSIS — L97828 Non-pressure chronic ulcer of other part of left lower leg with other specified severity: Secondary | ICD-10-CM | POA: Insufficient documentation

## 2020-04-03 DIAGNOSIS — I87332 Chronic venous hypertension (idiopathic) with ulcer and inflammation of left lower extremity: Secondary | ICD-10-CM | POA: Insufficient documentation

## 2020-04-03 NOTE — Progress Notes (Signed)
° °  Subjective:  Patient presents today status post tibiotalar calcaneal arthrodesis left lower extremity. DOS: 12/15/2019.  Patient continues to be treated at the [facility] weekly for wound care to the medial aspect of the ankle.  She states there is significant improvement and the wound is slowly healing.  She does have a history of chronic open wounds/venous stasis ulcers for over a decade now.  Past Medical History:  Diagnosis Date   Allergy    Ankle wound 02/2016   bilateral   Dental bridge present    lower   Dental crowns present    upper right   Open wounds involving multiple regions of lower extremity    Peripheral vascular disease ([place])    venous stasis ulcers bilateral lower extremities   PONV (postoperative nausea and vomiting)    Objective/Physical Exam Skin incision completely healed to the lateral aspect of the ankle.  There is some moderate edema noted.  Pulses are palpable.  Capillary refill within normal limits.  Negative range of motion noted to the rear foot and ankle.  Medial aspect of the ankle demonstrates a healthy granular wound base with some necrotic debris to the deeper portion of the wound. No malodor noted.    Otherwise the foot is in a rectus position with good alignment of the foot in relationship to the leg.  The foot is in an almost 90 degree angle.  Radiographic exam: Orthopedic hardware appears intact and stable.  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  Patient has now over 3 months postoperative and radiographically appears stable and healing well.  Assessment: 1. s/p tibiotalar calcaneal arthrodesis left. DOS: 12/15/2019 2.  Postsurgical edema left-mostly resolved 3.  Ulcer right medial ankle secondary to venous insufficiency 4.  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 Patient was evaluated.  2.  Continue management of wound ulcers to the medial aspect of the ankle at  the wound care center. 3.  At this point the patient may begin physical therapy and light pressure in the cam boot.  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  Order placed for physical therapy 4.  Return to clinic in 6 weeks for follow-up  Edrick Kins, DPM [facility]  Dr. Edrick Kins, DPM    [address], [place] [address]                Office [phone]  Fax [phone]

## 2020-04-04 NOTE — Progress Notes (Signed)
[name] ([id number]) Visit Report for 04/03/2020 Arrival Information Details Patient Name: Date of Service: [place] [name], [name] 04/03/2020 8:45 A M Medical Record Number: [id number] Patient Account Number: [id number] Date of Birth/Sex: Treating RN: Nov 16, 1955 (64 y.o. [name] Primary Care Zitlali Primm: [name] Other Clinician: Referring Kateleen Encarnacion: Treating Madilynn Montante/Extender: [name] in Treatment: 11 Visit Information History Since Last Visit Added or deleted any medications: No Patient Arrived: [name] Any new allergies or adverse reactions: No Arrival Time: 08:50 Had a fall or experienced change in No Accompanied By: self activities of daily living that may affect Transfer Assistance: None risk of falls: Patient Identification Verified: Yes Signs or symptoms of abuse/neglect since last visito No Secondary Verification Process Completed: Yes Hospitalized since last visit: No Patient Requires Transmission-Based Precautions: No Implantable device outside of the clinic excluding No Patient Has Alerts: Yes cellular tissue based products placed in the center Patient Alerts: L ABI =1.12, TBI = .92 since last visit: R ABI= 1.02, TBI= .58 Has Dressing in Place as Prescribed: Yes Pain Present Now: Yes Electronic Signature(s) Signed: 04/03/2020 10:38:15 AM By: [name] Entered By: [name] on 04/03/2020 08:51:57 -------------------------------------------------------------------------------- Compression Therapy Details Patient Name: Date of Service: [name], [name] 04/03/2020 8:45 A M Medical Record Number: [id number] Patient Account Number: [id number] Date of Birth/Sex: Treating RN: 10-19-1955 (64 y.o. [name] Primary Care Nichlos Kunzler: [name] Other Clinician: Referring Jovon Streetman: Treating Jalaysha Skilton/Extender: [name] in Treatment: 11 Compression Therapy Performed for Wound  Assessment: Wound #15 Left,Medial Malleolus Performed By: Jake Church, RN Compression Type: Rolena Infante Post Procedure Diagnosis Same as Pre-procedure Electronic Signature(s) Signed: 04/04/2020 9:08:43 AM By: [name] RN Entered By: [name] on 04/03/2020 09:35:34 -------------------------------------------------------------------------------- Encounter Discharge Information Details Patient Name: Date of Service: [facility] [name], [name] 04/03/2020 8:45 A M Medical Record Number: [id number] Patient Account Number: [id number] Date of Birth/Sex: Treating RN: 10-20-1955 (64 y.o. [name] Primary Care Davion Flannery: [name] Other Clinician: Referring Mckynlie Vanderslice: Treating Shaletha Humble/Extender: [name] in Treatment: 11 Encounter Discharge Information Items Discharge Condition: Stable Ambulatory Status: Walker Discharge Destination: Home Transportation: Private Auto Accompanied By: self Schedule Follow-up Appointment: Yes Clinical Summary of Care: Electronic Signature(s) Signed: 04/03/2020 4:26:11 PM By: Deon Pilling Entered By: Deon Pilling on 04/03/2020 10:05:30 -------------------------------------------------------------------------------- Lower Extremity Assessment Details Patient Name: Date of Service: [facility] [name], [name] 04/03/2020 8:45 A M Medical Record Number: [id number] Patient Account Number: [id number] Date of Birth/Sex: Treating RN: January 08, 1956 (64 y.o. [name] Jordan Pardini: [name] Other Clinician: Referring Leeloo Silverthorne: Treating Quanell Loughney/Extender: [name] in Treatment: 11 Edema Assessment Assessed: [name]: No] [Right: No] Edema: [Left: Ye] [Right: s] Calf Left: Right: Point of Measurement: From Medial Instep 29.5 cm Ankle Left: Right: Point of Measurement: From Medial Instep 21.5 cm Vascular Assessment Pulses: Dorsalis Pedis Palpable: [Left:Yes] Electronic  Signature(s) Signed: 04/04/2020 5:51:45 PM By: Baruch Gouty RN, BSN Entered By: Baruch Gouty on 04/03/2020 09:11:36 -------------------------------------------------------------------------------- Multi Wound Chart Details Patient Name: Date of Service: [name] [name], [name] 04/03/2020 8:45 A M Medical Record Number: [id number] Patient Account Number: [id number] Date of Birth/Sex: Treating RN: August 12, 1955 (64 y.o. [name] Primary Care Akeela Busk: [name] Other Clinician: Referring Katheen Aslin: Treating [facility] St[facility]ur/Extender: [name] in Treatment: 11 Vital Signs Height(in): 68 Pulse(bpm): 55 Weight(lbs): Blood Pressure(mmHg): 122/59 Body Mass Index(BMI): Temperature(F): 98.0 Respiratory Rate(breaths/min): 18  Photos: [15:No Photos Left, Medial Malleolus] [N/A:N/A N/A] Wound Location: [15:Gradually Appeared] [N/A:N/A] Wounding Event: [15:Venous Leg Ulcer] [N/A:N/A] Primary Etiology: [15:Peripheral Venous Disease,] [N/A:N/A] Comorbid History: [15:Osteoarthritis, Confinement Anxiety 12/30/2019] [N/A:N/A] Date Acquired: [15:11] [N/A:N/A] Weeks of Treatment: [15:Open] [N/A:N/A] Wound Status: [15:4.3x1.4x0.8] [N/A:N/A] Measurements L x W x D (cm) [15:4.728] [N/A:N/A] A (cm) : rea [15:3.782] [N/A:N/A] Volume (cm) : [15:59.50%] [N/A:N/A] % Reduction in A rea: [15:59.50%] [N/A:N/A] % Reduction in Volume: [15:6] Starting Position 1 (o'clock): [15:7] Ending Position 1 (o'clock): [15:1] Maximum Distance 1 (cm): [15:Yes] [N/A:N/A] Undermining: [15:Full Thickness With Exposed Support N/A] Classification: [15:Structures Medium] [N/A:N/A] Exudate Amount: [15:Serosanguineous] [N/A:N/A] Exudate Type: [15:red, brown] [N/A:N/A] Exudate Color: [15:Distinct, outline attached] [N/A:N/A] Wound Margin: [15:Large (67-100%)] [N/A:N/A] Granulation Amount: [15:Red, Pink] [N/A:N/A] Granulation Quality: [15:Small (1-33%)] [N/A:N/A] Necrotic Amount: [15:Fat  Layer (Subcutaneous Tissue): Yes N/A] Exposed Structures: [15:Tendon: Yes Fascia: No Muscle: No Joint: No Bone: No Small (1-33%)] [N/A:N/A] Epithelialization: [15:Compression Therapy] [N/A:N/A] Treatment Notes Wound #15 (Left, Medial Malleolus) 1. Cleanse With Wound Cleanser Soap and water 2. Periwound Care Barrier cream Moisturizing lotion 3. Primary Dressing Applied Calcium Alginate Ag Other primary dressing (specifiy in notes) 4. Secondary Dressing ABD Pad Dry Gauze 6. Support Layer Production assistant, radio Notes keystone medication applied under the alginate Ag. Electronic Signature(s) Signed: 04/03/2020 4:57:00 PM By: Linton Ham MD Signed: 04/04/2020 9:08:43 AM By: [name] RN Entered By: Linton Ham on 04/03/2020 10:05:51 -------------------------------------------------------------------------------- Multi-Disciplinary Care Plan Details Patient Name: Date of Service: [facility] [name], [name] 04/03/2020 8:45 A M Medical Record Number: [id number] Patient Account Number: [id number] Date of Birth/Sex: Treating RN: Aug 14, 1955 (64 y.o. [name] Primary Care Gautam Langhorst: [name] Other Clinician: Referring Undine Nealis: Treating Osmel Dykstra/Extender: [name] in Treatment: 11 Active Inactive Abuse / Safety / Falls / Self Care Management Nursing Diagnoses: Potential for falls Potential for injury related to falls Goals: Patient will not experience any injury related to falls Date Initiated: 01/17/2020 Target Resolution Date: 04/06/2020 Goal Status: Active Patient/caregiver will verbalize/demonstrate measures taken to prevent injury and/or falls Date Initiated: 01/17/2020 Date Inactivated: 03/12/2020 Target Resolution Date: 03/09/2020 Goal Status: Met Interventions: Assess Activities of Daily Living upon admission and as needed Assess fall risk on admission and as needed Assess: immobility, friction, shearing, incontinence upon admission  and as needed Assess impairment of mobility on admission and as needed per policy Assess personal safety and home safety (as indicated) on admission and as needed Assess self care needs on admission and as needed Provide education on fall prevention Provide education on personal and home safety Notes: Wound/Skin Impairment Nursing Diagnoses: Impaired tissue integrity Knowledge deficit related to ulceration/compromised skin integrity Goals: Patient/caregiver will verbalize understanding of skin care regimen Date Initiated: 01/17/2020 Target Resolution Date: 04/06/2020 Goal Status: Active Ulcer/skin breakdown will have a volume reduction of 30% by week 4 Date Initiated: 01/17/2020 Date Inactivated: 03/12/2020 Target Resolution Date: 03/09/2020 Goal Status: Met Interventions: Assess patient/caregiver ability to obtain necessary supplies Assess patient/caregiver ability to perform ulcer/skin care regimen upon admission and as needed Assess ulceration(s) every visit Provide education on ulcer and skin care Notes: Electronic Signature(s) Signed: 04/04/2020 9:08:43 AM By: [name] RN Entered By: [name] on 04/03/2020 09:23:45 -------------------------------------------------------------------------------- Pain Assessment Details Patient Name: Date of Service: [name], [name] 04/03/2020 8:45 A M Medical Record Number: [id number] Patient Account Number: [id number] Date of Birth/Sex: Treating RN: [birth year] (64 y.o. [name] Primary Care Taunja Brickner: [name] Other Clinician: Referring Indio Santilli: Treating Quinci Gavidia/Extender: Dellia Nims  Myrlene Broker Weeks in Treatment: 11 Active Problems Location of Pain Severity and Description of Pain Patient Has Paino Yes Site Locations Rate the pain. Current Pain Level: 4 Pain Management and Medication Current Pain Management: Electronic Signature(s) Signed: 04/03/2020 10:38:15 AM By: [name] Signed:  04/04/2020 9:08:43 AM By: [name] RN Entered By: [name] on 04/03/2020 08:52:31 -------------------------------------------------------------------------------- Patient/Caregiver Education Details Patient Name: Date of Service: [name], [name] 11/2/2021andnbsp8:45 A M Medical Record Number: [id number] Patient Account Number: [id number] Date of Birth/Gender: Treating RN: [birth year] (64 y.o. [name] Primary Care Physician: [name] Other Clinician: Referring Physician: Treating Physician/Extender: [name] in Treatment: 11 Education Assessment Education Provided To: Patient Education Topics Provided Wound/Skin Impairment: Methods: Explain/Verbal Responses: State content correctly [employer]) Signed: 04/04/2020 9:08:43 AM By: [name] RN Entered By: [name] on 04/03/2020 09:24:08 -------------------------------------------------------------------------------- Wound Assessment Details Patient Name: Date of Service: [name], [name] 04/03/2020 8:45 A M Medical Record Number: [id number] Patient Account Number: [id number] Date of Birth/Sex: Treating RN: [birth year] (64 y.o. [name] Primary Care Rosser Collington: [name] Other Clinician: Referring Orlan Aversa: Treating Karston Hyland/Extender: [name] in Treatment: 11 Wound Status Wound Number: 15 Primary Venous Leg Ulcer Etiology: Wound Location: Left, Medial Malleolus Wound Status: Open Wounding Event: Gradually Appeared Comorbid Peripheral Venous Disease, Osteoarthritis, Confinement Date Acquired: 12/30/2019 History: Anxiety Weeks Of Treatment: 11 Clustered Wound: No Wound Measurements Length: (cm) 4.3 Width: (cm) 1.4 Depth: (cm) 0.8 Area: (cm) 4.728 Volume: (cm) 3.782 % Reduction in Area: 59.5% % Reduction in Volume: 59.5% Epithelialization: Small (1-33%) Tunneling: No Undermining: Yes Starting Position  (o'clock): 6 Ending Position (o'clock): 7 Maximum Distance: (cm) 1 Wound Description Classification: Full Thickness With Exposed Support Structures Wound Margin: Distinct, outline attached Exudate Amount: Medium Exudate Type: Serosanguineous Exudate Color: red, brown Foul Odor After Cleansing: No Slough/Fibrino Yes Wound Bed Granulation Amount: Large (67-100%) Exposed Structure Granulation Quality: Red, Pink Fascia Exposed: No Necrotic Amount: Small (1-33%) Fat Layer (Subcutaneous Tissue) Exposed: Yes Necrotic Quality: Adherent Slough Tendon Exposed: Yes Muscle Exposed: No Joint Exposed: No Bone Exposed: No Treatment Notes Wound #15 (Left, Medial Malleolus) 1. Cleanse With Wound Cleanser Soap and water 2. Periwound Care Barrier cream Moisturizing lotion 3. Primary Dressing Applied Calcium Alginate Ag Other primary dressing (specifiy in notes) 4. Secondary Dressing ABD Pad Dry Gauze 6. Support Layer Production assistant, radio Notes keystone medication applied under the alginate Ag. Electronic Signature(s) Signed: 04/04/2020 9:08:43 AM By: [name] RN Signed: 04/04/2020 5:51:45 PM By: Baruch Gouty RN, BSN Entered By: Baruch Gouty on 04/03/2020 09:13:09 -------------------------------------------------------------------------------- Vitals Details Patient Name: Date of Service: [name], [name] 04/03/2020 8:45 A M Medical Record Number: [id number] Patient Account Number: [id number] Date of Birth/Sex: Treating RN: 19-Aug-1955 (64 y.o. [name] Primary Care Asiana Benninger: [name] Other Clinician: Referring Koltin Wehmeyer: Treating Jaleah Lefevre/Extender: [name] in Treatment: 11 Vital Signs Time Taken: 08:51 Temperature (F): 98.0 Height (in): 68 Pulse (bpm): 55 Respiratory Rate (breaths/min): 18 Blood Pressure (mmHg): 122/59 Reference Range: 80 - 120 mg / dl Electronic Signature(s) Signed: 04/03/2020 10:38:15 AM By: [name] Entered By: [name] on 04/03/2020 08:52:16

## 2020-04-04 NOTE — Progress Notes (Signed)
[name], [name] ([id number]) Visit Report for 04/03/2020 HPI Details Patient Name: Date of Service: [facility] [name], [name] 04/03/2020 8:45 A M Medical Record Number: [id number] Patient Account Number: [id number] Date of Birth/Sex: Treating RN: [birth year] (64 y.o. [name] Primary Care Provider: [name] Other Clinician: Referring Provider: Treating Provider/Extender: [name] in Treatment: 11 History of Present Illness HPI Description: the remaining wound is over the left medial ankle. Similar wound over the right medial ankle healed largely with use of Apligraf. Most recently we have been using Hydrofera Blue over this wound with considerable improvement. The patient has been extensively worked up in the past for her venous insufficiency and she is not a candidate for antireflux surgery although I have none of the details available currently. 08/24/14; considerable improvement today. About 50% of this wound areas now epithelialized. The base of the wound appears to be healthier granulation.as opposed to last week when she had deteriorated a considerable improvement 08/17/14; unfortunately the wound has regressed somewhat. The areas of epithelialization from the superior aspect are not nearly as healthy as they were last week. The patient thinks her Hydrofera Blue slipped. 09/07/14; unfortunately the area has markedly regressed in the 2 weeks since I've seen this. There is an odor surrounding erythema. The healthy granulation tissue that we had at the base of the wound now is a dusky color. The nurse reports green drainage 09/14/14; the area looks somewhat better than last week. There is less erythema and less drainage. The culture I did did not show any growth. Nevertheless I think it is better to continue the Cipro and doxycycline for a further week. The remaining wound area was debridement. 09/21/14. Wound did not require debridement last week. Still less  erythema and less drainage. She can complete her antibiotics. The areas of epithelialization in the superior aspect of the wound do not look as healthy as they did some weeks ago 10/05/14 continued improvement in the condition of this wound. There is advancing epithelialization. Less aggressive debridement required 10/19/14 continued improvement in the condition and volume of this wound. Less aggressive debridement to the inferior part of this to remove surface slough and fibrinous eschar 11/02/14 no debridement is required. The surface granulation appears healthy although some of her islands of epithelialization seem to have regressed. No evidence of infection 11/16/14; lites surface debridement done of surface eschar. The wound does not look to be unhealthy. No evidence of infection. Unfortunately the patient has had podiatry issues in the right foot and for some reason has redeveloped small surface ulcerations in the medial right ankle. Her original presentation involved wounds in this area 11/23/14 no debridement. The area on the right ankle has enlarged. The left ankle wound appears stable in terms of the surface although there is periwound inflammation. There has been regression in the amount of new skin 11/30/14 no debridement. Both wound areas appear healthy. There was no evidence of infection. The the new area on the right medial ankle has enlarged although that both the surfaces appear to be stable. 12/07/14; Debridement of the right medial ankle wound. No no debridement was done on the left. 12/14/14 no major change in and now bilateral medial ankle wounds. Both of these are very painful but the no overt evidence of infection. She has had previous venous ablation 12/21/14; patient states that her right medial ankle wound is considerably more painful last week than usual. Her left is also somewhat painful. She could not  tolerate debridement. The right medial ankle wound has fibrinous surface  eschar 12/28/14 this is a patient with severe bilateral venous insufficiency ulcers. For a considerable period of time we actually had the one on the right medial ankle healed however this recently opened up again in June. The left medial ankle wound has been a refractory area with some absent flows. We had some success with Hydrofera Blue on this area and it literally closed by 50% however it is recently opened up Foley. Both of these were debridement today of surface eschar. She tolerates this poorly 01/25/15: No change in the status of this. Thick adherent escar. Very poor tolerance of any attempt at debridement. I had healed the right medial malleolus wound for a considerable amount of time and had the left one down to about 50% of the volume although this is totally regressed over the last 48 weeks. Further the right leg has reopened. she is trying to make a appointment with pain and vascular, previous ablations with Dr. Aleda Grana. I do not believe there is an arterial insufficiency issue here 02/01/15 the status of the adherent eschar bilaterally is actually improved. No debridement was done. She did not manage to get vascular studies done 02/08/15 continued debridement of the area was done today. The slough is less adherent and comes off with less pressure. There is no surrounding infection peripheral pulses are intact 02/15/15 selective debridement with a disposable curette. Again the slough is less adherent and comes off with less difficulty. No surrounding infection peripheral pulses are intact. 02/22/15 selective debridement of the right medial ankle wound. Slough comes off with less difficulty. No obvious surrounding infection peripheral pulses are intact I did not debridement the one on the left. Both of these are stable to improved 03/01/15 selective debridement of both wound areas using a curette to. Adherent slough cup soft with less difficulty. No obvious surrounding infection.  The patient tells me that 2 days ago she noted a rash above the right leg wrap. She did not have this on her lower legs when she change this over she arrives with widespread left greater than right almost folliculitis-looking rash which is extremely pruritic. I don't see anything to culture here. There is no rash on the rest of her body. She feels well systemically. 03/08/15; selective debridement of both wounds using a curette. Base of this does not look unhealthy. She had limegreen drainage coming out of the left leg wound and describes a lot of drainage. The rash on her left leg looks improved to. No cultures were done. 03/22/15; patient was not here last week. Basal wounds does not look healthy and there is no surrounding erythema. No drainage. There is still a rash on the left leg that almost looks vasculitic however it is clearly limited to the top of where the wrap would be. 04/05/15; on the right required a surgical debridement of surface eschar and necrotic subcutaneous tissue. I did not debridement the area on the left. These continue to be large open wounds that are not changing that much. We were successful at one point in healing the area on the right, and at the same time the area on the left was roughly half the size of current measurements. I think a lot of the deterioration has to do with the prolonged time the patient is on her feet at work 04/19/15 I attempted-like surface debridement bilaterally she does not tolerate this. She tells me that she was in allergic care yesterday with  extreme pain over her left lateral malleolus/ankle and was told that she has an "sprain" 05/03/15; large bilateral venous insufficiency wounds over the medial malleolus/medial aspect of her ankles. She complains of copious amounts of drainage and his usual large amounts of pain. There is some increasing erythema around the wound on the right extending into the medial aspect of her foot to. historically she  came in with these wounds the right one healed and the left one came down to roughly half its current size however the right one is reopened and the left is expanded. 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 05/10/15 large bilateral venous insufficiency wounds. There is less adherence surface left however the surface culture that I did last week grew pseudomonas therefore bilateral selective debridement score necessary. There is surrounding erythema. The patient describes severe bilateral drainage and a lot of pain in the left ankle. Apparently her podiatrist was were ready to do a cortisone shot 05/17/15; the patient complains of pain and again copious amounts of drainage. 05/24/15; we used Iodo flex last week. Patient notes considerable improvement in wound drainage. Only needed to change this once. 05/31/15; we continued Iodoflex; the base of these large wounds bilaterally is not too bad but there is probably likely a significant bioburden here. I would like to debridement just doesn't tolerate it. 06/06/14 I would like to continue the Iodoflex although she still hasn't managed to obtain supplies. She has bilateral medial malleoli or large wounds which are mostly superficial. Both of them are covered circumferentially with some nonviable fibrinous slough although she tolerates debridement very poorly. She apparently has an appointment for an ablation on the right leg by interventional radiology. 06/14/15; the patient arrives with the wounds and static condition. We attempted a debridement although she does not do well with this secondary to pain. I 07/05/15; wounds are not much smaller however there appears to be a cleaner granulating base. The left has tight fibrinous slough greater than the right. Debridement is tolerated poorly due to pain. Iodoflex is done more for these wounds in any of the multitude of different dressings I have tried on the left  1 and then subsequently the right. 07/12/15; no change in the condition of this wound. I am able to do an aggressive debridement on the right but not the left. She simply cannot tolerate it. We have been using Iodoflex which helps somewhat. It is worthwhile remembering that at one point we healed the right medial ankle wound and the left was about 25% of the current circumference. We have suggested returning to vascular surgery for review of possible further ablations for one reason or another she has not been able to do this. 07/26/15 no major change in the condition of either wound on her medial ankle. I did not attempt to debridement of these. She has been aggressively scrubbing these while she is in the shower at home. She has her supply of Iodoflex which seems to have done more for these wounds then anything I have put on recently. 08/09/15 wound area appears larger although not verified by measurements. Using Iodoflex 09/05/2015 -- she was here for avisit today but had significant problems with the wound and I was asked to see her for a physician opinion. I have summarize that this lady has had surgery on her left lower extremity about 10 years ago where the possible veins stripping was done. She has  had an opinion from interventional radiology around November 2016 where no further sclerotherapy was ordered. The patient works 12 hours a day and stands on a concrete floor with work boots and is unable to get the proper compression she requires and cannot elevate her limbs appropriately at any given time. She has recently grown Pseudomonas from her wound culture but has not started her ciprofloxacin which was called in for her. 09/13/15 this continues to be a difficult situation for this patient. At one point I had this wound down to a 1.5 x 1.5" wound on her left leg. This is deteriorated and the right leg has reopened. She now has substantial wounds on her medial calcaneus, malleoli and into her  lower leg. One on the left has surface eschar but these are far too painful for me to debridement here. She has a vascular surgery appointment next week to see if anything can be done to help here. I think she has had previous ablations several years ago at [facility]. She has no major edema. She tells me that she did not get product last time [facility] Ag] and went for several days without it. She continues to work in work boots 12 hours a day. She cannot get compression/4-layer under her work boots. 09/20/15 no major change. Periwound edema control was not very good. Her point with pain and vascular is next Wednesday the 25th 09/28/15; the patient is seen vascular surgery and is apparently scheduled for repeat duplex ultrasounds of her bilateral lower legs next week. 10/05/15; the patient was seen by Dr. Doren Custard of vascular surgery. He feels that she should have arterial insufficiency excluded as cause/contributed to her nonhealing stage she is therefore booked for an arteriogram. She has apparently monophasic signals in the dorsalis pedis pulses. She also of course has known severe chronic venous insufficiency with previous procedures as noted previously. I had another long discussion with the patient today about her continuing to work 12 hour shifts. I've written her out for 2 months area had concerns about this as her work location is currently undergoing significant turmoil and this may lead to her termination. She is aware of this however I agree with her that she simply cannot continue to stand for 12 hours multiple days a week with the substantial wound areas she has. 10/19/15; the Dr. Doren Custard appointment was largely for an arteriogram which was normal. She does not have an arterial issue. He didn't make a comment about her chronic venous insufficiency for which she has had previous ablations. Presumably it was not felt that anything additional could be done. The patient is now out of work as I  prescribed 2 weeks ago. Her wounds look somewhat less aggravated presumably because of this. I felt I would give debridement another try today 10/25/15; no major change in this patient's wounds. We are struggling to get her product that she can afford into her own home through her insurance. 11/01/15; no major change in the patient's wounds. I have been using silver alginate as the most affordable product. I spoke to Dr. Marla Roe last week with her requested take her to the OR for surgical debridement and placement of ACEL. Dr. Marla Roe told me that she would be willing to do this however [facility] will not cover this, fortunately the patient has [place] healthcare of some variant 11/08/15; no major change in the patient's wounds. She has been completely nonviable surface that this but is in too much pain with any  attempted debridement are clinic. I have arranged for her to see Dr. Marla Roe ham of plastic surgery and this appointment is on Monday. I am hopeful that they will take her to the OR for debridement, possible ACEL ultimately possible skin graft 11/22/15 no major change in the patient's wounds over her bilateral medial calcaneus medial malleolus into the lower legs. Surface on these does not look too bad however on the left there is surrounding erythema and tenderness. This may be cellulitis or could him sleepy tinea. 11/29/15; no major changes in the patient's wounds over her bilateral medial malleolus. There is no infection here and I don't think any additional antibiotics are necessary. There is now plan to move forward. She sees Dr. Marla Roe in a week's time for preparation for operative debridement and ACEL placement I believe on 7/12. She then has a follow-up appointment with Dr. Marla Roe on 7/21 12/28/15; the patient returns today having been taken to the [place] by Dr. Marla Roe 12/12/15 she underwent debridement, intraoperative cultures [which were negative]. She had  placement of a wound VAC. Parent really ACEL was not available to be placed. The wound VAC foam apparently adhered to the wound since then she's been using silver alginate, Xeroform under Ace wraps. She still says there is a lot of drainage and a lot of pain 01/31/16; this is a patient I see monthly. I had referred her to Dr. Marla Roe him of plastic surgery for large wounds on her bilateral medial ankles. She has been to the OR twice once in early July and once in early August. She tells me over the last 3 weeks she has been using the wound VAC with ACEL underneath it. On the right we've simply been using silver alginate. Under Kerlix Coban wraps. 02/28/16; this is a patient I'm currently seeing monthly. She is gone on to have a skin graft over her large venous insufficiency ulcer on the left medial ankle. This was done by Dr. Marla Roe him. The patient is a bit perturbed about why she didn't have one on her right medial ankle wound. She has been using silver alginate to this. 03/06/16; I received a phone call from her plastic surgery Dr. Marla Roe. She expressed some concern about the viability of the skin graft she did on the left medial ankle wound. Asked me to place Endoform on this. She told me she is not planning to do a subsequent skin graft on the right as the left one did not take very well. I had placed Hydrofera Blue on the right 03/13/16; continue to have a reasonably healthy wound on the right medial ankle. Down to 3 mm in terms of size. There is epithelialization here. The area on the left medial ankle is her skin graft site. I suppose the last week this looks somewhat better. She has an open area inferiorly however in the center there appears to be some viable tissue. There is a lot of surface callus and eschar that will eventually need to come off however none of this looked to be infected. Patient states that the is able to keep the dressing on for several days which is an  improvement. 03/20/16 no major change in the circumference of either wound however on the left side the patient was at Dr. Eusebio Friendly office and they did a debridement of left wound. 50% of the wound seems to be epithelialized. I been using Endoform on the left Hydrofera Blue in the right 03/27/16; she arrives today with her wound is not  looking as healthy as they did last week. The area on the right clearly has an adherent surface to this a very similar surface on the left. Unfortunately for this patient this is all too familiar problem. Clearly the Endoform is not working and will need to change that today that has some potential to help this surface. She does not tolerate debridement in this clinic very well. She is changing the dressing wants 04/03/16; patient arrives with the wounds looking somewhat better especially on the right. Dr. Migdalia Dk change the dressing to silver alginate when she saw her on Monday and also sold her some compression socks. The usefulness of the latter is really not clear and woman with severely draining wounds. 04/10/16; the patient is doing a bit of an experiment wearing the compression stockings that Dr. Migdalia Dk provided her to her left leg and the out of legs based dressings that we provided to the right. 05/01/16; the patient is continuing to wear compression stockings Dr. Migdalia Dk provided her on the left that are apparently silver impregnated. She has been using Iodoflex to the right leg wound. Still a moderate amount of drainage, when she leaves here the wraps only last for 4 days. She has to change the stocking on the left leg every night 05/15/16; she is now using compression stockings bilaterally provided by Dr. Marla Roe. She is wearing a nonadherent layer over the wounds so really I don't think there is anything specific being done to this now. She has some reduction on the left wound. The right is stable. I think all healing here is being done without a  specific dressing 06/09/16; patient arrives here today with not much change in the wound certainly in diameter to large circular wounds over the medial aspect of her ankle bilaterally. Under the light of these services are certainly not viable for healing. There is no evidence of surrounding infection. She is wearing compression stockings with some sort of silver impregnation as prescribed by Dr. Marla Roe. She has a follow-up with her tomorrow. 06/30/16; no major change in the size or condition of her wounds. These are still probably covered with a nonviable surface. She is using only her purchase stockings. She did see Dr. Marla Roe who seemed to want to apply Dakin's solution to this I'm not extreme short what value this would be. I would suggest Iodoflex which she still has at home. 07/28/16; I follow Mrs. Doten episodically along with Dr. Marla Roe. 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 She has been applying a topical collagen ointment to both wounds with Adaptic. I don't think Dr. Marla Roe is planning to take her back to the OR. 08/19/16; I follow Mrs. Jeneen Rinks on a monthly basis along with Dr. Marla Roe of plastic surgery. 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 I been following her for a number of years. At one point I was able to get the right medial malleolus wound to heal and had the left medial malleolus down to about half its current size however and I had to send her to plastic surgery for an operative debridement. Since then things have been stable to slightly improve the area on the right is slightly better one in the left about the same although there is much less adherent surface than I'm used to with this patient. She is using some form of liquid collagen gel that Dr. Marla Roe provided a Kerlix  cover with the patient's own pressure stockings.  She tells me that she has extreme pain in both ankles and along the lateral aspect of both feet. She has been unable to work for some period of time. She is telling me she is retiring at the beginning of April. She sees Dr. Doran Durand of orthopedics next week 09/22/16; patient has not seen Dr. Marla Roe since the last time she is here. I'm not really sure what she is using to the wounds other than bits and pieces of think she had left over including most recently Hydrofera Blue. She is using juxtalite stockings. She is having difficulty with her husband's recent illness "stroke". She is having to transport him to various doctors appointments. Dr. Marla Roe left her the option of a repeat debridement with ACEL however she has not been able to get the time to follow-up on this. She continues to have a fair amount of drainage out of these wounds with certainly precludes leaving dressings on all week 10/13/16; patient has not seen Dr. Marla Roe since she was last in our clinic. I'm not really sure what she is doing with the wounds, we did try to get her [facility] and I think she is actually using this most of the time. Because of drainage she states she has to change this every second day although this is an improvement from what she used to do. She went to see Dr. Doran Durand who did not think she had a muscular issue with regards to her feet, he referred her to a neurologist and I think the appointment is sometime in June. I changed her back to Iodoflex which she has used in the past but not recently. 11/03/16; the patient has been using Iodoflex although she ran out of this. Still claims that there is a lot of drainage although the wound does not look like this. No surrounding erythema. She has not been back to see Dr. Marla Roe 11/24/16; the patient has been using Iodoflex again but she ran out of it 2 or 3 days ago. There is no major change in the condition of either one of these wounds in fact they are  larger and covered in a thick adherent surface slough/nonviable tissue especially on the left. She does not tolerate mechanical debridement in our clinic. Going back to see Dr. Marla Roe of plastic surgery for an operative debridement would seem reasonable. 12/15/16; the patient has not been back to see Dr. Marla Roe. She is been dealing with a series of illnesses and her husband which of monopolized her time. She is been using Sorbact which we largely supplied. She states the drainage is bad enough that it maximum she can go 2-3 days without changing the dressing 01/12/2017 -- the patient has not been back for about 4 weeks and has not seen Dr. Marla Roe not does she have any appointment pending. 01/23/17; patient has not seen Dr. Marla Roe even though I suggested this previously. She is using Santyl that was suggested last week by Dr. Con Memos this Cost her $16 through her insurance which is indeed surprising 02/12/17; continuing Santyl and the patient is changing this daily. A lot of drainage. She has not been back to see plastic surgery she is using an Ace wrap. Our intake nurse suggested wrap around stockings which would make a good reasonable alternative 02/26/17; patient is been using Santyl and changing this daily due to drainage. She has not been to see plastic surgery she uses in April Ace wrap to control the  edema. She did obtain extremitease stockings but stated that the edema in her leg was to big for these 03/20/17; patient is using Santyl and Anasept. Surfaces looked better today the area on the right is actually measuring a little smaller. She has states she has a lot of pain in her feet and ankles and is asking for a consult to pain control which I'll try to help her with through our case manager. 04/10/17; the patient arrives with better-looking wound surfaces and is slightly smaller wound on the left she is using a combination of Santyl and Anasept. She has an appointment or at least as  started in the pain control center associated with [facility] 05/14/17; this is a patient who I followed for a prolonged period of time. 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 At one point I had this down to a much smaller wound on the left however these reopened and we've never been able to get these to heal. She has been using Santyl and Anasept gel although 2 weeks ago she ran out of the Anasept gel. She has a stable appearance of the wound. She is going to the wound care clinic at [facility]. They wanted do a nerve block/spinal block although she tells me she is reluctant to go forward with that. 05/21/17; this is a patient I have followed for many years. 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 Chronic pain and deformity in her ankles as well. She is been to see plastic surgery as well as orthopedics. Using PolyMem AG most recently/Kerramax/ABDs and 2 layer compression. She has managed to keep this on and she is coming in for a nurse check to change the dressing on Tuesdays, we see her on Fridays 06/05/17; really quite a good looking surface and the area especially on the right medial has contracted in terms of dimensions. Well granulated healthy-looking tissue on both sides. Even with an open curet there is nothing that even feels abnormal here. This is as good as I've seen this in quite some time. We have been using PolyMem AG and bringing her in for a nurse check 06/12/17; really quite good surface on both of these wounds. The right medial has contracted a bit left is not. We've been using PolyMem and AG and she is coming in for a nurse visit 06/19/17; we have been using PolyMem AG and bringing her in for a nurse check. Dimensions of her wounds are not better but the surfaces looked better bilaterally. She complained of bleeding last night and the left wound and increasing pain bilaterally. She states her wound pain is more  neuropathic than just the wounds. There was some suggestion that this was radicular from her pain management doctor in talking to her it is really difficult to sort this out. 06/26/17; using PolyMem and AG and bringing her in for a nurse check as All of this and reasonably stable condition. Certainly not improved. The dimensions on the lateral part of the right leg look better but not really measuring better. The medial aspect on the left is about the same. 07/03/16; we have been using PolyMen AG and bringing her in for a nurse check to change the dressings as the wounds have drainage which precludes once weekly changing. We are using all secondary absorptive dressings.our intake nurse is brought up the idea of using a wound VAC/snap VAC on the wound to help with the drainage to see if this would  result in some contraction. This is not a bad idea. The area on the right medial is actually looking smaller. Both wounds have a reasonable-looking surface. There is no evidence of cellulitis. The edema is well controlled 07/10/17; the patient was denied for a snap VAC by her insurance. The major issue with these wounds continues to be drainage. We are using wicked PolyMem AG and she is coming in for a nurse visit to change this. The wounds are stable to slightly improved. The surface looks vibrant and the area on the right certainly has shrunk in size but very slowly 07/17/17; the patient still has large wounds on her bilateral medial malleoli. Surface of both of these wounds looks better. The dimensions seem to come and go but no consistent improvement. There is no epithelialization. We do not have options for advanced treatment products due to insurance issues. They did not approve of the wound VAC to help control the drainage. More recently we've been using PolyMem and AG wicked to allow drainage through. We have been bringing her in for a nurse visit to change this. We do not have a lot of options for wound  care products and the home again due to insurance issues 07/24/17; the patient's wound actually looks somewhat better today. No drainage measurements are smaller still healthy-looking surface. We used silver collagen under PolyMen started last week. We have been bringing her in for a dressing change 07/31/17; patient's wound surface continued to look better and I think there is visible change in the dimensions of the wound on the right. Rims of epithelialization. We have been using silver collagen under PolyMen and bringing her in for a dressing change. There appears to be less drainage although she is still in need of the dressing change 08/07/17. Patient's wound surface continues to look better on both sides and the area on the right is definitely smaller. We have been using silver collagen and PolyMen. She feels that the drainage has been it has been better. I asked her about her vascular status. She went to see Dr. Aleda Grana at [facility] and had some form of ablation. I don't have much detail on this. I haven't my notes from 2016 that she was not a candidate for any further ablation but I don't have any more information on this. We had referred her to vein and vascular I don't think she ever went. He does not have a history of PAD although I don't have any information on this either. We don't even have ABIs in our record 08/14/17; we've been using silver collagen and PolyMen cover. And putting the patient and compression. She we are bringing her in as a nurse visit to change this because ofarge amount of drainage. We didn't the ABIs in clinic today since they had been done in many moons 1.2 bilaterally. She has been to see vein and vascular however this was at [facility] and she had ablation although I really don't have any information on this all seemed biking get a report. She is also been operatively debrided by plastic surgery and had a cell placed probably 8-12 months ago. This didn't have  a major effect. We've been making some gains with current dressings 08/19/17-She is here in follow-up evaluation for bilateral medial malleoli ulcers. She continues to tolerate debridement very poorly. We will continue with recently changed topical treatment; if no significant improvement may consider switching to Iodosorb/Iodoflex. 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 She has been using silver collagen and PolyMem. I believe she has been used and tried on Iodoflex before. 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 These have been very refractory over time. We have been using silver collagen and PolyMen. She is coming in once a week for a doctor's and once a week for nurse visits. We are actually making some progress 09/18/17; the patient's wounds are smaller especially on the right medial. She arrives today to upset to consider even washing these off with Anasept which I think is been part of the reason this is been closing. We've been using collagen covered in PolyMen otherwise. It is noted that she has a small area of folliculitis on the right medial calf that. As we are wrapping her legs I'll give her a short course of doxycycline to make sure this doesn't amount to anything. She is a long list of complaints today including imbalance, shortness of breath on exertion, inversion of her left ankle. With regards to the latter complaints she is been to see orthopedics and they offered her a tendon release surgery I believe but wanted her wounds to be closed first. I have recommended she go see her primary physician with regards to everything else. 09/25/17; patient's wounds are about the same size. We have made some progress bilaterally although not in recent  weeks. She will not allow me T wash these wounds with Anasept even if she is doing her cell. Wheeze we've been using collagen covered in PolyMen. Last week she had a small area of folliculitis this is now opened into a small wound. She completed 5 days of trimethoprim sulfamethoxazole 10/02/17; unfortunately the area on her left medial ankle is worse with a larger wound area towards the Achilles. The patient complains of a lot of pain. She will not allow debridement although visually I don't think there is anything to debridement in any case. We have been using silver collagen and PolyMen for several months now. Initially we are making some progress although I'm not really seeing that today. We will move back to [facility]. His admittedly this is a bit of a repeat however I'm hoping that his situation is different now. The patient tells me she had her leg on the left give out on her yesterday this is process some pain. 10/09/17; the patient is seen twice a week largely because of drainage issues coming out of the chronic medial bimalleolar wounds that are chronic. Last week the dimensions of the one on the left looks a little larger I changed her to [facility]. She comes in today with a history of terrible pain in the bilateral wound areas. She will not allow debridement. She will not even allow a tissue culture. There is no surrounding erythema no no evidence of cellulitis. We have been putting her Kerlix Coban man. She will not allow more aggressive compression as there was a suggestion to put her in 3 layer wraps. 10/16/17; large wounds on her bilateral medial malleoli. These are chronic. Not much change from last week. The surface looks have healthy but absolutely no epithelialization. A lot of pain little less so of drainage. She will not allow debridement or even washing these off in the vigorous fashion with Anasept. 10/23/17; large wounds on her bilateral malleoli which are  chronic. Some  improvement in terms of size perhaps on the right since last time I saw these. She states that after we increased the 3 layer compression there was some bleeding, when she came in for a nurse visit she did not want 3 layer compression put back on about our nurse managed to convince her. She has known chronic venous visit issues and I'm hoping to get her to tolerate the 3 layer compression. using Hydrofera Blue 10/30/17; absolutely no change in the condition of either wound although we've had some improvement in dimensions on the right.. Attempted to put her in 3 layer compression she didn't tolerated she is back in 2 layer compression. We've been using Hydrofera Blue We looked over her past records. She had venous reflux studies in November 2016. There was no evidence of deep venous reflux on the right. Superficial vein did not show the greater saphenous vein at think this is been previously ablated the small saphenous vein was within normal limits. The left deep venous system showed no DVT the vessels were positive for deep venous reflux in the posterior tibial veins at the ankle. The greater saphenous vein was surgically absent small saphenous vein was within normal limits. She went to vein and vascular at [facility]. I believe she had an ablation on the left greater saphenous vein. I'll update her reflux studies perhaps ever reviewed by vein and vascular. We've made absolutely no progress in these wounds. Will also try to read and TheraSkins through her insurance 11/06/17; W the patient apparently has a 2 week follow-up with vein and vascular I like him to review the whole issue with regards to her previous vascular workup by Dr. Aleda Grana. We've really made no progress on these wounds in many months. She arrives today with less viable looking surface on the left medial ankle wound. This was apparently looking about the same on Tuesday when she was here for nurse visit. 11/13/17; deep tissue  culture I did last time of the left lower leg showed multiple organisms without any predominating. In particular no Staphylococcus or group A strep were isolated. We sent her for venous reflux studies. She's had a previous left greater saphenous vein stripping and I think sclerotherapy of the right greater saphenous vein. She didn't really look at the lesser saphenous vein this both wounds are on the medial aspect. She has reflux in the common femoral vein and popliteal vein and an accessory vein on the right and the common femoral vein and popliteal vein on the left. I'm going to have her go to see vein and vascular just the look over things and see if anything else beside aggressive compression is indicated here. We have not been able to make any progress on these wounds in spite of the fact that the surface of the wounds is never look too bad. 11/20/17; no major change in the condition of the wounds. Patient reports a large amount of drainage. She has a lot of complaints of pain although enlisting her today I wonder if some of this at least his neuropathic rather than secondary to her wounds. She has an appointment with vein and vascular on 12/30/17. The refractory nature of these wounds in my mind at least need vein and vascular to look over the wounds the recent reflux studies we did and her history to see if anything further can be done here. I also note her gait is deteriorated quite a bit. Looks like she has inversion of her  foot on the right. She has a bilateral Trendelenburg gait. I wonder if this is neuropathic or perhaps multilevel radicular. 11/27/17; her wounds actually looks slightly better. Healthy-looking granulation tissue a scant amount of epithelialization. [place] healthcare will not pay for [employer]. They will play for tri layer Oasis and Dermagraft. This is not a diabetic ulcer. We'll try for the tri layer Oasis. She still complains of some drainage. She has a vein and vascular  appointment on 12/30/17 12/04/17; the wounds visually look quite good. Healthy-looking granulation with some degree of epithelialization. We are still waiting for response to our request for trial to try layer Oasis. Her appointment with vascular to review venous and arterial issues isn't sold the end of July 7/31. Not allow debridement or even vigorous cleansing of the wound surface. 12/18/17; slightly smaller especially on the right. 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 We've been using Hydrofera Blue. We still are looking into triple layer Oasis through her insurance 01/08/18 on evaluation today patient's wound actually appears to be showing signs of good improvement at this point in time. She has been tolerating the dressing changes without complication. Fortunately there does not appear to be any evidence of infection at this point in time. We have been utilizing silver nitrate which does seem to be of benefit for her which is also good news. Overall I'm very happy with how things seem to be both regards appearance as well as measurement. Patient did see Dr. Bridgett Larsson for evaluation on 12/30/17. In his assessment he felt that stripping would not likely add much more than chronic compression to the patient's healing process. His recommendation was to follow-up in three months with Dr. Doren Custard if she hasn't healed in order to consider referral back to you and see vascular where she previously was in a trial and was able to get her wound to heal. I'll be see what she feels she when you staying compression and he reiterated this as well. 01/13/18 on evaluation today patient appears to actually be doing very well in regard to her bilateral medial malleolus ulcers. She seems to have tolerated the chemical cauterization with silver nitrate last week she did have some pain through that evening but fortunately states that I'll be see since it seems to be doing better she is overall  pleased with the progress. 01/21/18; really quite a remarkable improvement since I've last seen these wounds. We started using silver nitrate specially on the islands of hyper granulation which for some reason her around the wound circumference. This is really done quite nicely. Primary dressing Hydrofera Blue under 4 layer compression. She seems to be able to hold out without a nurse rewrap. Follow-up in 1 week 01/28/18; we've continued the hydrofera blue but continued with chemical cauterization to the wound area that we started about a month ago for irregular hyper granulation. She is made almost stunning improvement in the overall wound dimensions. I was not really expecting this degree of improvement in these chronic wounds 02/05/18; we continue with Hydrofera Bluebut of also continued the aggressive chemical cauterization with silver nitrate. We made nice progress with the right greater than left wound. 02/12/18. We continued with Hydrofera Blue after aggressive chemical cauterization with silver nitrate. We appear to be making nice progress with both wound areas 02/19/2018; we continue with [facility] after washing the wounds vigorously with Anasept spray and chemical cauterization with silver nitrate. We are making excellent progress. The area on the right's just about closed 02/26/2018.  The area on the left medial ankle had too much necrotic debris today. I used a #5 curette we are able to get most of the soft. I continued with the silver nitrate to the much smaller wound on the right medial ankle she had a new area on her right lower pretibial area which she says was due to a role in her compression 03/05/2018; both wound areas look healthy. Not much change in dimensions from last week. I continue to use silver nitrate and Hydrofera Blue. The patient saw Dr. Doren Custard of vein and vascular. He felt she had venous stasis ulcers. He felt based on her previous arteriogram she should have  adequate circulation for healing. Also she has deep venous reflux but really no significant correctable superficial venous reflux at this time. He felt we should continue with conservative management including leg elevation and compression 04/02/2018; since we last saw this woman about a month ago she had a fall apparently suffered a pelvic fracture. I did not look up the x-ray. Nevertheless because of pain she literally was bedbound for 2 weeks and had home health coming out to change the dressing. Somewhat predictably this is resulted in considerable improvement in both wound areas. The right is just about closed on the medial malleolus and the left is about half the size. 04/16/2018; both her wounds continue to go down in size. Using Hydrofera Blue. 05/07/18; both her wounds appeared to be improving especially on the right where it is almost closed. We are using Hydrofera Blue 05/14/2018; slightly worse this week with larger wounds. Surface on the left medial not quite as good. We have been using Hydrofera Blue 05/21/18; again the wounds are slightly larger. Left medial malleolus slightly larger with eschar around the circumference. We have been using Hydrofera Blue undergoing a wraps for a prolonged period of time. This got a lot better when she was more recumbent due to a fall and a back injury. I change the primary dressing the silver alginate today. She did not tolerate a 4 layer compression previously although I may need to bring this up with her next time 05/28/2018; area on the left medial malleolus again is slightly larger with more drainage. Area on the right is roughly unchanged. She has a small area of folliculitis on the right medial just on the lower calf. This does not look ominous. 06/03/2018 left medial malleolus slightly smaller in a better looking surface. We used silver nitrate on this last time with silver alginate. The area on the right appears slightly smaller 1/10; left medial  malleolus slightly smaller. Small open area on the right. We used silver nitrate and silver alginate as of 2 weeks ago. We continue with the wound and compression. These got a lot better when she was off her feet 1/17; right medial malleolus wound is smaller. The left may be slightly smaller. Both surfaces look somewhat better. 1/24; both wounds are slightly smaller. Using silver alginate under Unna boots 1/31; both wounds appear smaller in fact the area on the right medial is just about closed. Surface eschar. We have been using silver alginate under Unna boots. The patient is less active now spends let much less time on her feet and I think this is contributed to the general improvement in the wound condition 2/7; both wounds appear smaller. I was hopeful the right medial would be closed however there there is still the same small open area. Slight amount of surface eschar on the left the dimensions  are smaller there is eschar but the wound edges appear to be free. We have been using silver alginate under Unna boot's 2/14; both wounds once again measure smaller. Circumferential eschar on the left medial. We have been using silver alginate under Unna boots with gradual improvement 2/21; the area on the right medial malleolus has healed. The area on the left is smaller. We have been using silver alginate and Unna boots. We can discharge wrapping the right leg she has 20/30 stockings at home she will need to protect the scar tissue in this area 2/28; the area on the right medial malleolus remains closed the patient has a compression stocking. The area on the left is smaller. We have been using silver alginate and Unna boots. 3/6 the area on the right medial ankle remains closed. Good edema control noted she is using her own compression stocking. The area on the left medial ankle is smaller. We have been managing this with silver alginate and Unna boots which we will continue today. 3/13; the area on  the right medial ankle remains closed and I'm declaring it healed today. When necessary the left is about the same still a healthy-looking surface but no major change and wound area. No evidence of infection and using silver alginate under unna and generally making considerable improvement 3/27 the area on the right medial ankle remains closed the area on the left is about the same as last week. Certainly not any worse we have been using silver alginate under an Unna boot 4/3; the area on the right medial ankle remains closed per the patient. We did not look at this wound. The wound on the left medial ankle is about the same surface looks healthy we have been using silver alginate under an Unna boot 4/10; area on the right medial ankle remains closed per the patient. We did not look at this wound. The wound on the left medial ankle is slightly larger. The patient complains that the [facility] caused burning pain all week. She also told us that she was a lot more active this week. Changed her back to silver alginate 4/17; right medial ankle still closed per the patient. Left medial ankle is slightly larger. Using silver alginate. She did not tolerate Hydrofera Blue on this area 4/24; right medial ankle remains closed we have not look at this. The left medial ankle continues to get larger today by about a centimeter. We have been using silver alginate under Unna boots. She complains about 4 layer compression as an alternative. She has been up on her feet working on her garden 5/8; right medial ankle remains closed we did not look at this. The left medial ankle has increased in size about 100%. We have been using silver alginate under Unna boots. She noted increased pain this week and was not surprised that the wound is deteriorated 5/15; no major change in SA however much less erythema ( one week of doxy ocellulitis). 5/22-64 year old female returns at 1 week to the clinic for left medial ankle  wound for which we have been using silver alginate under 3 layer compression She was placed on DOXY at last visit - the wound is wider at this visit. She is in 3 layer compression 5/29; change to [facility] last week. I had given her empiric doxycycline 2 weeks ago for a week. She is in 3 layer compression. She complains of a lot of pain and drainage on presentation today. 6/5; using Hydrofera Blue. I  gave her doxycycline recently empirically for erythema and pain around the wound. Believe her cultures showed enterococcus which not would not have been well covered by doxycycline nevertheless the wound looks better and I don't feel specifically that the enterococcus needs to be covered. She has a new what looks like a wrap injury on her lateral left ankle. 6/12; she is using Hydrofera Blue. She has a new area on the left anterior lower tibial area. This was a wrap injury last week. 6/19; the patient is using Hydrofera Blue. She arrived with marked inflammation and erythema around the wound and tenderness. 12/01/18 on evaluation today patient appears to be doing a little bit better based on what I'm hearing from the standpoint of lassos evaluation to this as far as the overall appearance of the wound is concerned. Then sometime substandard she typically sees Dr. Dellia Nims. Nonetheless overall very pleased with the progress that she's made up to this point. No fevers, chills, nausea, or vomiting noted at this time. 7/10; some improvement in the surface area. Aggressively debrided last week apparently. I went ahead with the debridement today although the patient does not tolerate this very well. We have been using Iodoflex. Still a fair amount of drainage 7/17; slightly smaller. Using Iodoflex. 7/24; no change from last week in terms of surface area. We have been using Iodoflex. Surface looks and continues to look somewhat better 7/31; surface area slightly smaller better looking surface. We have been  using Iodoflex. This is under Unna boot compression 8/7-Patient presents at 1 week with Unna boot and Iodoflex, wound appears better 8/14-Patient presents at 1 week with Iodoflex, we use the Unna boot, wound appears to be stable better.Patient is getting Botox treatment for the inversion of the foot for tendon release, Next week 8/21; we are using Iodoflex. Unna boot. The wound is stable in terms of surface area. Under illumination there is some areas of the wound that appear to be either epithelialized or perhaps this is adherent slough at this point I was not really clear. It did not wipe off and I was reluctant to debride this today. 8/28; we are using Iodoflex in an Unna boot. Seems to be making good improvement. 9/4; using Iodoflex and wound is slightly smaller. 9/18; we are using Iodoflex with topical silver nitrate when she is here. The wound continues to be smaller 10/2; patient missed her appointment last week due to GI issues. She left and Iodoflex based dressing on for 2 weeks. Wound is about the same size about the size of a dime on the left medial lower 10/9 we have been using Iodoflex on the medial left ankle wound. She has a new superficial probable wrap injury on the dorsal left ankle 10/16; we have been using Hydrofera Blue since last week. This is on the left medial ankle 10/23; we have been using Hydrofera Blue since 2 weeks ago. This is on the left medial ankle. Dimensions are better 11/6; using Hydrofera Blue. I think the wound is smaller but still not closed. Left medial ankle 11/13; we have been using Hydrofera Blue. Wound is certainly no smaller this week. Also the surface not as good. This is the remanent of a very large area on her left medial ankle. 11/20; using Sorbact since last week. Wound was about the same in terms of size although I was disappointed about the surface debris 12/11; 3-week follow-up. Patient was on vacation. Wound is measuring slightly larger we have  been using Sorbact. 12/18;  wound is about the same size however surface looks better last week after debridement. We have been using Sorbact under compression 1/15 wound is probably twice the size of last time increased in length nonviable surface. We have been using Sorbact. She was running a mild fever and missed her appointment last week 1/22; the wound is come down in size but under illumination still a very adherent debris we have been Hydrofera Blue that I changed her to last week 1/29; dimensions down slightly. We have been using Hydrofera Blue 2/19 dimensions are the same however there is rims of epithelialization under illumination. Therefore more the surface area may be epithelialized 2/26; the patient's wound actually measures smaller. The wound looks healthy. We have been using Hydrofera Blue. I had some thoughts about running Apligraf then I still may do that however this looks so much better this week we will delay that for now 3/5; the wound is small but about the same as last week. We have been using Hydrofera Blue. No debridement is required today. 3/19; the wound is about the size of a dime. Healthy looking wound even under illumination. We have been using Hydrofera Blue. No mechanical debridement is necessary 3/26; not much change from last week although still looks very healthy. We have been using Hydrofera Blue under Unna boots Patient was offered an ankle fusion by podiatry but not until the wound heals with a proceed with this. 4/9; the patient comes in today with her original wound on the medial ankle looking satisfactory however she has some uncontrolled swelling in the middle part of her leg with 2 new open areas superiorly just lateral to the tibia. I think this was probably a wrap issue. She said she felt uncomfortable during the week but did not call in. We have been using Hydrofera Blue 4/16; the wound on the medial ankle is about the same. She has innumerable small  areas superior to this across her mid tibia. I think this is probably folliculitis. She is also been working in the yard doing a lot of sweating 4/30; the patient issue on the upper areas across her mid tibia of all healed. I think this was excessive yard work if I remember. Her wound on the medial ankle is smaller. Some debris on this we have been using Hydrofera Blue under Unna boots 5/7; mid tibia. She has been using Hydrofera Blue under an Unna wrap. She is apparently going for her ankle surgery on June 3 10/28/19-Patient returns to clinic with the ankle wound, we are using Hydrofera Blue under Unna wrap, surgery is scheduled for her left foot for June 3 so she will be back for nurse visit next week READMISSION 01/17/2020 Mrs. Kirn is a 64 year old woman we have had in this clinic for a long period of time with severe venous hypertension and refractory wounds on her medial lower legs and ankles bilaterally. 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 When she was off her legs for a prolonged period example when she fell and suffered a compression fracture things would heal up quite nicely. She is now retired and we managed to heal up the right medial leg wound. The left one was very tiny last time I saw this although still refractory. 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 It got to the point where this was interfering with her walking and she elected to proceed with a ankle arthrodesis to straighten her her ankle and leave her with a functional outcome for mobilization. The patient was referred to Dr. Doren Custard and really this took some time to arrange. Dr. Doren Custard saw her on 12/07/2019. Once again he verified that she had no arterial issues. She had previously had an angiogram several  years ago. Follow-up ABIs on the left showed an ABI of 1.12 with triphasic waveforms and a TBI of 0.92. She is felt to have chronic deep venous insufficiency but I do not think it was felt that anything could be done from about this from an ablation point of view. At the time Dr. Doren Custard saw this patient the wounds actually look closed via the pictures in his clinic. The patient finally underwent her surgery on 12/15/2019. This went reasonably well and there was a good anatomic outcome. She developed a small distal wound dehiscence on the lateral part of the surgical wound. However more problematically she is developed recurrence of the wound on the medial left ankle. There are actually 2 wounds here one in the distal lower leg and 1 pretty much at the level of the medial malleolus. It is a more distal area that is more problematic. She has been using Hydrofera Blue which started on Friday before this she was simply Ace wrapping. There was a culture done that showed Pseudomonas and she is on ciprofloxacin. A recent CNS on 8/11 was negative. The patient reports some pain but I generally think this is improving. She is using a cam boot completely nonweightbearing using a walker for pivot transfers and a wheelchair 8/24; not much improvement unfortunately she has a surgical wound on the lateral part in the venous insufficiency wound medially. The bottom part of the medial insufficiency wound is still necrotic there is exposed tendon here. We have been using Hydrofera Blue under compression. Her edema control is however better 8/31; patient in for follow-up of his surgical wound on the lateral part of her left leg and chronic venous insufficiency ulcers medially. We put her back in compression last week. She comes in today with a complaint of 3 or 4 days worth of increasing pain. She felt her cam walker was rubbing on the area on the back of her heel. However there is intense erythema seems more likely she  has cellulitis. She had 2 cultures done when she was seeing podiatry in the postop. One of them in late July showed Pseudomonas and she received a course of ciprofloxacin the other was negative on 8/11 she is allergic to penicillin with anaphylactoid complaints of hives oral swelling via information in epic 9/9; when I saw this patient last week she had intense anterior erythema around her wound on the right lateral heel and ankle and also into the right medial heel. Some of this was no doubt drainage and her walker boot however I was convinced she had cellulitis. I gave her Levaquin and Bactrim she is finishing up on this now. She is following up with Dr. [name] he saw her yesterday. He is taken her out of the walking boot of course she is still nonweightbearing. Her x-ray was negative for any worrisome features such as soft tissue air etc. Things are a lot better this week. She has home health. We have been using Hydrofera Blue under an [employer] which she put back on yesterday. I did  not wrap her last week 9/17; her surrounding skin looks a lot better. In fact the area on the left lateral ankle has just a scant amount of eschar. The only remaining wound is the large area on the left medial ankle. Probably about 60% of this is healthy granulation at the surface however she has a significant divot distally. This has adherent debris in it. I been using debridement and silver collagen to try and get this area to fill-in although I do not think we have made much progress this week 9/24; the patient's wound on the left medial ankle looks a lot better. The deeper divot area distally still requires debridement but this is cleaning up quite nicely we have been using silver collagen. The patient is complaining of swelling in her foot and is worried that that is contributing to the nonhealing of the ankle wound. She is also complaining of numbness in her anterior toes 10/4; left medial ankle. The small area  distally still has a divot with necrotic material that I have been debriding away. This has an undermining area. She is approved for Apligraf. She saw Dr. [name] her surgeon on 10/1. I think he declared himself is satisfied with the condition of things. Still nonweightbearing till the end of the month. 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 Her surgical wound is well closed. There is no evidence of infection 10/11; the patient arrived in clinic today with the expectation that we be able to put an Apligraf on this area after debridement however she arrives with a relatively large amount of green drainage on the dressing. The patient states that this started on Friday. She has not been systemically unwell. 10/19; culture I did last week showed both Enterococcus and Pseudomonas. I think this came in separate parts because I stopped her ciprofloxacin I gave her and prescribed her linezolid however now looking at the final culture result this is Pseudomonas which is resistant to quinolones. She has not yet picked up the linezolid apparently phone issues. We are also trying to get a topical antibiotic out of [place] [facility] in [place] they can be applied by home health. She is still having green drainage 10/16; the patient has her topical antibiotic from [facility] in [place]. This is a compounded gel with vancomycin and ciprofloxacin and gentamicin. We are applying this on the wound bed with silver alginate over the top with Unna boot wraps. She arrives in clinic today with a lot less ominous looking drainage although she is only use this topical preparation once the second time today. She sees Dr. [name] her surgeon on Friday she has home health changing the dressing 11/2; still using her compounded topical antibiotic under silver alginate. Surface is cleaning up there is less drainage. My plan is to order the Apligraf next week for application 2 weeks from  now Electronic Signature(s) Signed: 04/03/2020 4:57:00 PM By: Linton Ham MD Entered By: Linton Ham on 04/03/2020 10:06:52 -------------------------------------------------------------------------------- Physical Exam Details Patient Name: Date of Service: [name], [name] 04/03/2020 8:45 A M Medical Record Number: [id number] Patient Account Number: [id number] Date of Birth/Sex: Treating RN: 03-18-1956 (64 y.o. [name] Primary Care Provider: [name] Other Clinician: Referring Provider: Treating Provider/Extender: [name] in Treatment: 11 Constitutional Sitting or standing Blood Pressure is within target range for patient.. Pulse regular and within target range for patient.Marland Kitchen Respirations regular, non-labored and within target range.. Temperature is normal and  within the target range for the patient.Marland Kitchen Appears in no distress. Cardiovascular Pedal pulses are palpable. Notes Wound exam; left medial ankle the most distal part of the wound still has the greatest depth still some gelatinous debris. Under illumination the surface also requires some debridement although I did not do this today however it will certainly need to be done before we apply the Apligraf. I saw no evidence of infection around the wound peripheral pulses are palpable and edema is well controlled Electronic Signature(s) Signed: 04/03/2020 4:57:00 PM By: Linton Ham MD Entered By: Linton Ham on 04/03/2020 10:07:55 -------------------------------------------------------------------------------- Physician Orders Details Patient Name: Date of Service: [name], [name] 04/03/2020 8:45 A M Medical Record Number: [id number] Patient Account Number: [id number] Date of Birth/Sex: Treating RN: 07-Dec-1955 (64 y.o. 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 Dressing Change Frequency Wound #15 Left,Medial Malleolus Change dressing three times week. - wound clinic to change on Mondays, home health to change on Wednesday and Friday Skin Barriers/Peri-Wound Care Moisturizing lotion - to leg Wound Cleansing May shower with protection. - use cast protector Primary Wound Dressing Wound #15 Left,Medial Malleolus lginate with Silver - Apply keystone topical antibiotic medication. Follow the direction on how to mix and apply per pharmacy directions. Calcium A Apply under alginate Ag. Patient to bring in keystone topical antibiotic medication to each wound care encounter. Secondary Dressing Wound #15 Left,Medial Malleolus Dry Gauze ABD pad Edema Control Unna Boot to Left Lower Extremity Avoid standing for long periods of time Elevate legs to the level of the heart or above for 30 minutes daily and/or when sitting, a frequency of: - throughout the day Off-Loading Other: - ensure no pressure to wound areas. Additional Orders / Instructions Other: - Linezolid antibiotics as prescribed. [place] skilled [facility] for wound care. - [facility] home health to change twice a week Electronic Signature(s) Signed: 04/03/2020 4:57:00 PM By: Linton Ham MD Signed: 04/04/2020 9:08:43 AM By: [name] RN Entered By: [name] on 04/03/2020 09:23:29 -------------------------------------------------------------------------------- Problem List Details Patient Name: Date of Service: [name], [name] 04/03/2020  8:45 A M Medical Record Number: [id number] Patient Account Number: [id number] Date of Birth/Sex: Treating RN: 1955/08/16 (64 y.o. [name] Primary Care Provider: [name] Other Clinician: Referring Provider: Treating Provider/Extender: [name] in Treatment: 11 Active Problems ICD-10 Encounter Code Description Active Date MDM Diagnosis I87.332 Chronic venous hypertension (idiopathic) with ulcer and inflammation of left 01/17/2020 No Yes lower extremity L97.828 Non-pressure chronic ulcer of other part of left lower leg with other specified 01/17/2020 No Yes severity L97.328 Non-pressure chronic ulcer of left ankle with other specified severity 01/17/2020 No Yes Inactive Problems ICD-10 Code Description Active Date Inactive Date L03.116 Cellulitis of left lower limb 01/31/2020 01/31/2020 T81.31XD Disruption of external operation (surgical) wound, not elsewhere classified, subsequent 01/17/2020 01/17/2020 encounter Resolved Problems Electronic Signature(s) Signed: 04/03/2020 4:57:00 PM By: Linton Ham MD Entered By: Linton Ham on 04/03/2020 10:05:43 -------------------------------------------------------------------------------- Progress Note Details Patient Name: Date of Service: [name], ELEA NO R  G. 04/03/2020 8:45 A M Medical Record Number: [id number] Patient Account Number: [id number] Date of Birth/Sex: Treating RN: 11/15/1955 (64 y.o. [name] Primary Care Provider: [name] Other Clinician: Referring Provider: Treating Provider/Extender: [name] in Treatment: 11 Subjective History of Present Illness (HPI) the remaining wound is over the left medial ankle. Similar wound over the right medial ankle healed largely with use of Apligraf. Most recently we have been using Hydrofera Blue over this wound with considerable improvement. The patient has been extensively worked up in the past for her  venous insufficiency and she is not a candidate for antireflux surgery although I have none of the details available currently. 08/24/14; considerable improvement today. About 50% of this wound areas now epithelialized. The base of the wound appears to be healthier granulation.as opposed to last week when she had deteriorated a considerable improvement 08/17/14; unfortunately the wound has regressed somewhat. The areas of epithelialization from the superior aspect are not nearly as healthy as they were last week. The patient thinks her Hydrofera Blue slipped. 09/07/14; unfortunately the area has markedly regressed in the 2 weeks since I've seen this. There is an odor surrounding erythema. The healthy granulation tissue that we had at the base of the wound now is a dusky color. The nurse reports green drainage 09/14/14; the area looks somewhat better than last week. There is less erythema and less drainage. The culture I did did not show any growth. Nevertheless I think it is better to continue the Cipro and doxycycline for a further week. The remaining wound area was debridement. 09/21/14. Wound did not require debridement last week. Still less erythema and less drainage. She can complete her antibiotics. The areas of epithelialization in the superior aspect of the wound do not look as healthy as they did some weeks ago 10/05/14 continued improvement in the condition of this wound. There is advancing epithelialization. Less aggressive debridement required 10/19/14 continued improvement in the condition and volume of this wound. Less aggressive debridement to the inferior part of this to remove surface slough and fibrinous eschar 11/02/14 no debridement is required. The surface granulation appears healthy although some of her islands of epithelialization seem to have regressed. No evidence of infection 11/16/14; lites surface debridement done of surface eschar. The wound does not look to be unhealthy. No  evidence of infection. Unfortunately the patient has had podiatry issues in the right foot and for some reason has redeveloped small surface ulcerations in the medial right ankle. Her original presentation involved wounds in this area 11/23/14 no debridement. The area on the right ankle has enlarged. The left ankle wound appears stable in terms of the surface although there is periwound inflammation. There has been regression in the amount of new skin 11/30/14 no debridement. Both wound areas appear healthy. There was no evidence of infection. The the new area on the right medial ankle has enlarged although that both the surfaces appear to be stable. 12/07/14; Debridement of the right medial ankle wound. No no debridement was done on the left. 12/14/14 no major change in and now bilateral medial ankle wounds. Both of these are very painful but the no overt evidence of infection. She has had previous venous ablation 12/21/14; patient states that her right medial ankle wound is considerably more painful last week than usual. Her left is also somewhat painful. She could not tolerate debridement. The right medial ankle wound has fibrinous surface eschar 12/28/14 this is a patient with severe bilateral  venous insufficiency ulcers. For a considerable period of time we actually had the one on the right medial ankle healed however this recently opened up again in June. The left medial ankle wound has been a refractory area with some absent flows. We had some success with Hydrofera Blue on this area and it literally closed by 50% however it is recently opened up Foley. Both of these were debridement today of surface eschar. She tolerates this poorly 01/25/15: No change in the status of this. Thick adherent escar. Very poor tolerance of any attempt at debridement. I had healed the right medial malleolus wound for a considerable amount of time and had the left one down to about 50% of the volume although this is  totally regressed over the last 48 weeks. Further the right leg has reopened. she is trying to make a appointment with pain and vascular, previous ablations with Dr. Aleda Grana. I do not believe there is an arterial insufficiency issue here 02/01/15 the status of the adherent eschar bilaterally is actually improved. No debridement was done. She did not manage to get vascular studies done 02/08/15 continued debridement of the area was done today. The slough is less adherent and comes off with less pressure. There is no surrounding infection peripheral pulses are intact 02/15/15 selective debridement with a disposable curette. Again the slough is less adherent and comes off with less difficulty. No surrounding infection peripheral pulses are intact. 02/22/15 selective debridement of the right medial ankle wound. Slough comes off with less difficulty. No obvious surrounding infection peripheral pulses are intact I did not debridement the one on the left. Both of these are stable to improved 03/01/15 selective debridement of both wound areas using a curette to. Adherent slough cup soft with less difficulty. No obvious surrounding infection. The patient tells me that 2 days ago she noted a rash above the right leg wrap. She did not have this on her lower legs when she change this over she arrives with widespread left greater than right almost folliculitis-looking rash which is extremely pruritic. I don't see anything to culture here. There is no rash on the rest of her body. She feels well systemically. 03/08/15; selective debridement of both wounds using a curette. Base of this does not look unhealthy. She had limegreen drainage coming out of the left leg wound and describes a lot of drainage. The rash on her left leg looks improved to. No cultures were done. 03/22/15; patient was not here last week. Basal wounds does not look healthy and there is no surrounding erythema. No drainage. There is still a rash on  the left leg that almost looks vasculitic however it is clearly limited to the top of where the wrap would be. 04/05/15; on the right required a surgical debridement of surface eschar and necrotic subcutaneous tissue. I did not debridement the area on the left. These continue to be large open wounds that are not changing that much. We were successful at one point in healing the area on the right, and at the same time the area on the left was roughly half the size of current measurements. I think a lot of the deterioration has to do with the prolonged time the patient is on her feet at work 04/19/15 I attempted-like surface debridement bilaterally she does not tolerate this. She tells me that she was in allergic care yesterday with extreme pain over her left lateral malleolus/ankle and was told that she has an "sprain" 05/03/15; large bilateral venous  insufficiency wounds over the medial malleolus/medial aspect of her ankles. She complains of copious amounts of drainage and his usual large amounts of pain. There is some increasing erythema around the wound on the right extending into the medial aspect of her foot to. historically she came in with these wounds the right one healed and the left one came down to roughly half its current size however the right one is reopened and the left is expanded. 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 05/10/15 large bilateral venous insufficiency wounds. There is less adherence surface left however the surface culture that I did last week grew pseudomonas therefore bilateral selective debridement score necessary. There is surrounding erythema. The patient describes severe bilateral drainage and a lot of pain in the left ankle. Apparently her podiatrist was were ready to do a cortisone shot 05/17/15; the patient complains of pain and again copious amounts of drainage. 05/24/15; we used Iodo flex last week. Patient  notes considerable improvement in wound drainage. Only needed to change this once. 05/31/15; we continued Iodoflex; the base of these large wounds bilaterally is not too bad but there is probably likely a significant bioburden here. I would like to debridement just doesn't tolerate it. 06/06/14 I would like to continue the Iodoflex although she still hasn't managed to obtain supplies. She has bilateral medial malleoli or large wounds which are mostly superficial. Both of them are covered circumferentially with some nonviable fibrinous slough although she tolerates debridement very poorly. She apparently has an appointment for an ablation on the right leg by interventional radiology. 06/14/15; the patient arrives with the wounds and static condition. We attempted a debridement although she does not do well with this secondary to pain. I 07/05/15; wounds are not much smaller however there appears to be a cleaner granulating base. The left has tight fibrinous slough greater than the right. Debridement is tolerated poorly due to pain. Iodoflex is done more for these wounds in any of the multitude of different dressings I have tried on the left 1 and then subsequently the right. 07/12/15; no change in the condition of this wound. I am able to do an aggressive debridement on the right but not the left. She simply cannot tolerate it. We have been using Iodoflex which helps somewhat. It is worthwhile remembering that at one point we healed the right medial ankle wound and the left was about 25% of the current circumference. We have suggested returning to vascular surgery for review of possible further ablations for one reason or another she has not been able to do this. 07/26/15 no major change in the condition of either wound on her medial ankle. I did not attempt to debridement of these. She has been aggressively scrubbing these while she is in the shower at home. She has her supply of Iodoflex which seems to have  done more for these wounds then anything I have put on recently. 08/09/15 wound area appears larger although not verified by measurements. Using Iodoflex 09/05/2015 -- she was here for avisit today but had significant problems with the wound and I was asked to see her for a physician opinion. I have summarize that this lady has had surgery on her left lower extremity about 10 years ago where the possible veins stripping was done. She has had an opinion from interventional radiology around November 2016 where no further sclerotherapy was ordered. The patient works 18  hours a day and stands on a concrete floor with work boots and is unable to get the proper compression she requires and cannot elevate her limbs appropriately at any given time. She has recently grown Pseudomonas from her wound culture but has not started her ciprofloxacin which was called in for her. 09/13/15 this continues to be a difficult situation for this patient. At one point I had this wound down to a 1.5 x 1.5" wound on her left leg. This is deteriorated and the right leg has reopened. She now has substantial wounds on her medial calcaneus, malleoli and into her lower leg. One on the left has surface eschar but these are far too painful for me to debridement here. She has a vascular surgery appointment next week to see if anything can be done to help here. I think she has had previous ablations several years ago at [facility]. She has no major edema. She tells me that she did not get product last time [facility]] and went for several days without it. She continues to work in work boots 12 hours a day. She cannot get compression/4-layer under her work boots. 09/20/15 no major change. Periwound edema control was not very good. Her point with pain and vascular is next Wednesday the 25th 09/28/15; the patient is seen vascular surgery and is apparently scheduled for repeat duplex ultrasounds of her bilateral lower legs next  week. 10/05/15; the patient was seen by Dr. Doren Custard of vascular surgery. He feels that she should have arterial insufficiency excluded as cause/contributed to her nonhealing stage she is therefore booked for an arteriogram. She has apparently monophasic signals in the dorsalis pedis pulses. She also of course has known severe chronic venous insufficiency with previous procedures as noted previously. I had another long discussion with the patient today about her continuing to work 12 hour shifts. I've written her out for 2 months area had concerns about this as her work location is currently undergoing significant turmoil and this may lead to her termination. She is aware of this however I agree with her that she simply cannot continue to stand for 12 hours multiple days a week with the substantial wound areas she has. 10/19/15; the Dr. Doren Custard appointment was largely for an arteriogram which was normal. She does not have an arterial issue. He didn't make a comment about her chronic venous insufficiency for which she has had previous ablations. Presumably it was not felt that anything additional could be done. The patient is now out of work as I prescribed 2 weeks ago. Her wounds look somewhat less aggravated presumably because of this. I felt I would give debridement another try today 10/25/15; no major change in this patient's wounds. We are struggling to get her product that she can afford into her own home through her insurance. 11/01/15; no major change in the patient's wounds. I have been using silver alginate as the most affordable product. I spoke to Dr. Marla Roe last week with her requested take her to the OR for surgical debridement and placement of ACEL. Dr. Marla Roe told me that she would be willing to do this however [facility] will not cover this, fortunately the patient has [place] healthcare of some variant 11/08/15; no major change in the patient's wounds. She has been completely  nonviable surface that this but is in too much pain with any attempted debridement are clinic. I have arranged for her to see Dr. Marla Roe ham of plastic surgery and this  appointment is on Monday. I am hopeful that they will take her to the OR for debridement, possible ACEL ultimately possible skin graft 11/22/15 no major change in the patient's wounds over her bilateral medial calcaneus medial malleolus into the lower legs. Surface on these does not look too bad however on the left there is surrounding erythema and tenderness. This may be cellulitis or could him sleepy tinea. 11/29/15; no major changes in the patient's wounds over her bilateral medial malleolus. There is no infection here and I don't think any additional antibiotics are necessary. There is now plan to move forward. She sees Dr. Marla Roe in a week's time for preparation for operative debridement and ACEL placement I believe on 7/12. She then has a follow-up appointment with Dr. Marla Roe on 7/21 12/28/15; the patient returns today having been taken to the [facility] by Dr. Marla Roe 12/12/15 she underwent debridement, intraoperative cultures [which were negative]. She had placement of a wound VAC. Parent really ACEL was not available to be placed. The wound VAC foam apparently adhered to the wound since then she's been using silver alginate, Xeroform under Ace wraps. She still says there is a lot of drainage and a lot of pain 01/31/16; this is a patient I see monthly. I had referred her to Dr. Marla Roe him of plastic surgery for large wounds on her bilateral medial ankles. She has been to the OR twice once in early July and once in early August. She tells me over the last 3 weeks she has been using the wound VAC with ACEL underneath it. On the right we've simply been using silver alginate. Under Kerlix Coban wraps. 02/28/16; this is a patient I'm currently seeing monthly. She is gone on to have a skin graft over her large venous  insufficiency ulcer on the left medial ankle. This was done by Dr. Marla Roe him. The patient is a bit perturbed about why she didn't have one on her right medial ankle wound. She has been using silver alginate to this. 03/06/16; I received a phone call from her plastic surgery Dr. Marla Roe. She expressed some concern about the viability of the skin graft she did on the left medial ankle wound. Asked me to place Endoform on this. She told me she is not planning to do a subsequent skin graft on the right as the left one did not take very well. I had placed Hydrofera Blue on the right 03/13/16; continue to have a reasonably healthy wound on the right medial ankle. Down to 3 mm in terms of size. There is epithelialization here. The area on the left medial ankle is her skin graft site. I suppose the last week this looks somewhat better. She has an open area inferiorly however in the center there appears to be some viable tissue. There is a lot of surface callus and eschar that will eventually need to come off however none of this looked to be infected. Patient states that the is able to keep the dressing on for several days which is an improvement. 03/20/16 no major change in the circumference of either wound however on the left side the patient was at Dr. Eusebio Friendly office and they did a debridement of left wound. 50% of the wound seems to be epithelialized. I been using Endoform on the left Hydrofera Blue in the right 03/27/16; she arrives today with her wound is not looking as healthy as they did last week. The area on the right clearly has an adherent surface to this  a very similar surface on the left. Unfortunately for this patient this is all too familiar problem. Clearly the Endoform is not working and will need to change that today that has some potential to help this surface. She does not tolerate debridement in this clinic very well. She is changing the dressing wants 04/03/16; patient  arrives with the wounds looking somewhat better especially on the right. Dr. Migdalia Dk change the dressing to silver alginate when she saw her on Monday and also sold her some compression socks. The usefulness of the latter is really not clear and woman with severely draining wounds. 04/10/16; the patient is doing a bit of an experiment wearing the compression stockings that Dr. Migdalia Dk provided her to her left leg and the out of legs based dressings that we provided to the right. 05/01/16; the patient is continuing to wear compression stockings Dr. Migdalia Dk provided her on the left that are apparently silver impregnated. She has been using Iodoflex to the right leg wound. Still a moderate amount of drainage, when she leaves here the wraps only last for 4 days. She has to change the stocking on the left leg every night 05/15/16; she is now using compression stockings bilaterally provided by Dr. Marla Roe. She is wearing a nonadherent layer over the wounds so really I don't think there is anything specific being done to this now. She has some reduction on the left wound. The right is stable. I think all healing here is being done without a specific dressing 06/09/16; patient arrives here today with not much change in the wound certainly in diameter to large circular wounds over the medial aspect of her ankle bilaterally. Under the light of these services are certainly not viable for healing. There is no evidence of surrounding infection. She is wearing compression stockings with some sort of silver impregnation as prescribed by Dr. Marla Roe. She has a follow-up with her tomorrow. 06/30/16; no major change in the size or condition of her wounds. These are still probably covered with a nonviable surface. She is using only her purchase stockings. She did see Dr. Marla Roe who seemed to want to apply Dakin's solution to this I'm not extreme short what value this would be. I would suggest Iodoflex which she still  has at home. 07/28/16; I follow Mrs. Vandam episodically along with Dr. Marla Roe. 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 She has been applying a topical collagen ointment to both wounds with Adaptic. I don't think Dr. Marla Roe is planning to take her back to the OR. 08/19/16; I follow Mrs. Jeneen Rinks on a monthly basis along with Dr. Marla Roe of plastic surgery. 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 I been following her for a number of years. At one point I was able to get the right medial malleolus wound to heal and had the left medial malleolus down to about half its current size however and I had to send her to plastic surgery for an operative debridement. Since then things have been stable to slightly improve the area on the right is slightly better one in the left about the same although there is much less adherent surface than I'm used to with this patient. She is using some form of liquid collagen gel that Dr. Marla Roe provided a Kerlix cover with the patient's own pressure stockings. She tells me that she has extreme pain in both ankles and  along the lateral aspect of both feet. She has been unable to work for some period of time. She is telling me she is retiring at the beginning of April. She sees Dr. Doran Durand of orthopedics next week 09/22/16; patient has not seen Dr. Marla Roe since the last time she is here. I'm not really sure what she is using to the wounds other than bits and pieces of think she had left over including most recently Hydrofera Blue. She is using juxtalite stockings. She is having difficulty with her husband's recent illness "stroke". She is having to transport him to various doctors appointments. Dr. Marla Roe left her the option of a repeat debridement with ACEL however she has not been able to get the time to follow-up on this. She  continues to have a fair amount of drainage out of these wounds with certainly precludes leaving dressings on all week 10/13/16; patient has not seen Dr. Marla Roe since she was last in our clinic. I'm not really sure what she is doing with the wounds, we did try to get her [facility] and I think she is actually using this most of the time. Because of drainage she states she has to change this every second day although this is an improvement from what she used to do. She went to see Dr. Doran Durand who did not think she had a muscular issue with regards to her feet, he referred her to a neurologist and I think the appointment is sometime in June. I changed her back to Iodoflex which she has used in the past but not recently. 11/03/16; the patient has been using Iodoflex although she ran out of this. Still claims that there is a lot of drainage although the wound does not look like this. No surrounding erythema. She has not been back to see Dr. Marla Roe 11/24/16; the patient has been using Iodoflex again but she ran out of it 2 or 3 days ago. There is no major change in the condition of either one of these wounds in fact they are larger and covered in a thick adherent surface slough/nonviable tissue especially on the left. She does not tolerate mechanical debridement in our clinic. Going back to see Dr. Marla Roe of plastic surgery for an operative debridement would seem reasonable. 12/15/16; the patient has not been back to see Dr. Marla Roe. She is been dealing with a series of illnesses and her husband which of monopolized her time. She is been using Sorbact which we largely supplied. She states the drainage is bad enough that it maximum she can go 2-3 days without changing the dressing 01/12/2017 -- the patient has not been back for about 4 weeks and has not seen Dr. Marla Roe not does she have any appointment pending. 01/23/17; patient has not seen Dr. Marla Roe even though I suggested this  previously. She is using Santyl that was suggested last week by Dr. Con Memos this Cost her $16 through her insurance which is indeed surprising 02/12/17; continuing Santyl and the patient is changing this daily. A lot of drainage. She has not been back to see plastic surgery she is using an Ace wrap. Our intake nurse suggested wrap around stockings which would make a good reasonable alternative 02/26/17; patient is been using Santyl and changing this daily due to drainage. She has not been to see plastic surgery she uses in April Ace wrap to control the edema. She did obtain extremitease stockings but stated that the edema in her leg was to big for these  03/20/17; patient is using Santyl and Anasept. Surfaces looked better today the area on the right is actually measuring a little smaller. She has states she has a lot of pain in her feet and ankles and is asking for a consult to pain control which I'll try to help her with through our case manager. 04/10/17; the patient arrives with better-looking wound surfaces and is slightly smaller wound on the left she is using a combination of Santyl and Anasept. She has an appointment or at least as started in the pain control center associated with [facility] 05/14/17; this is a patient who I followed for a prolonged period of time. 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 At one point I had this down to a much smaller wound on the left however these reopened and we've never been able to get these to heal. She has been using Santyl and Anasept gel although 2 weeks ago she ran out of the Anasept gel. She has a stable appearance of the wound. She is going to the wound care clinic at [facility]. They wanted do a nerve block/spinal block although she tells me she is reluctant to go forward with that. 05/21/17; this is a patient I have followed for many years. 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  Chronic pain and deformity in her ankles as well. She is been to see plastic surgery as well as orthopedics. Using PolyMem AG most recently/Kerramax/ABDs and 2 layer compression. She has managed to keep this on and she is coming in for a nurse check to change the dressing on Tuesdays, we see her on Fridays 06/05/17; really quite a good looking surface and the area especially on the right medial has contracted in terms of dimensions. Well granulated healthy-looking tissue on both sides. Even with an open curet there is nothing that even feels abnormal here. This is as good as I've seen this in quite some time. We have been using PolyMem AG and bringing her in for a nurse check 06/12/17; really quite good surface on both of these wounds. The right medial has contracted a bit left is not. We've been using PolyMem and AG and she is coming in for a nurse visit 06/19/17; we have been using PolyMem AG and bringing her in for a nurse check. Dimensions of her wounds are not better but the surfaces looked better bilaterally. She complained of bleeding last night and the left wound and increasing pain bilaterally. She states her wound pain is more neuropathic than just the wounds. There was some suggestion that this was radicular from her pain management doctor in talking to her it is really difficult to sort this out. 06/26/17; using PolyMem and AG and bringing her in for a nurse check as All of this and reasonably stable condition. Certainly not improved. The dimensions on the lateral part of the right leg look better but not really measuring better. The medial aspect on the left is about the same. 07/03/16; we have been using PolyMen AG and bringing her in for a nurse check to change the dressings as the wounds have drainage which precludes once weekly changing. We are using all secondary absorptive dressings.our intake nurse is brought up the idea of using a wound VAC/snap VAC on the wound to help with the drainage  to see if this would result in some contraction. This is not a bad idea. The area on the right medial is actually looking  smaller. Both wounds have a reasonable-looking surface. There is no evidence of cellulitis. The edema is well controlled 07/10/17; the patient was denied for a snap VAC by her insurance. The major issue with these wounds continues to be drainage. We are using wicked PolyMem AG and she is coming in for a nurse visit to change this. The wounds are stable to slightly improved. The surface looks vibrant and the area on the right certainly has shrunk in size but very slowly 07/17/17; the patient still has large wounds on her bilateral medial malleoli. Surface of both of these wounds looks better. The dimensions seem to come and go but no consistent improvement. There is no epithelialization. We do not have options for advanced treatment products due to insurance issues. They did not approve of the wound VAC to help control the drainage. More recently we've been using PolyMem and AG wicked to allow drainage through. We have been bringing her in for a nurse visit to change this. We do not have a lot of options for wound care products and the home again due to insurance issues 07/24/17; the patient's wound actually looks somewhat better today. No drainage measurements are smaller still healthy-looking surface. We used silver collagen under PolyMen started last week. We have been bringing her in for a dressing change 07/31/17; patient's wound surface continued to look better and I think there is visible change in the dimensions of the wound on the right. Rims of epithelialization. We have been using silver collagen under PolyMen and bringing her in for a dressing change. There appears to be less drainage although she is still in need of the dressing change 08/07/17. Patient's wound surface continues to look better on both sides and the area on the right is definitely smaller. We have been using  silver collagen and PolyMen. She feels that the drainage has been it has been better. I asked her about her vascular status. She went to see Dr. Aleda Grana at [facility] and had some form of ablation. I don't have much detail on this. I haven't my notes from 2016 that she was not a candidate for any further ablation but I don't have any more information on this. We had referred her to vein and vascular I don't think she ever went. He does not have a history of PAD although I don't have any information on this either. We don't even have ABIs in our record 08/14/17; we've been using silver collagen and PolyMen cover. And putting the patient and compression. She we are bringing her in as a nurse visit to change this because ofarge amount of drainage. We didn't the ABIs in clinic today since they had been done in many moons 1.2 bilaterally. She has been to see vein and vascular however this was at [facility] and she had ablation although I really don't have any information on this all seemed biking get a report. She is also been operatively debrided by plastic surgery and had a cell placed probably 8-12 months ago. This didn't have a major effect. We've been making some gains with current dressings 08/19/17-She is here in follow-up evaluation for bilateral medial malleoli ulcers. She continues to tolerate debridement very poorly. We will continue with recently changed topical treatment; if no significant improvement may consider switching to Iodosorb/Iodoflex. 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 She has been using silver collagen and PolyMem. I believe she has been used and tried on Iodoflex before.  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 These have  been very refractory over time. We have been using silver collagen and PolyMen. She is coming in once a week for a doctor's and once a week for nurse visits. We are actually making some progress 09/18/17; the patient's wounds are smaller especially on the right medial. She arrives today to upset to consider even washing these off with Anasept which I think is been part of the reason this is been closing. We've been using collagen covered in PolyMen otherwise. It is noted that she has a small area of folliculitis on the right medial calf that. As we are wrapping her legs I'll give her a short course of doxycycline to make sure this doesn't amount to anything. She is a long list of complaints today including imbalance, shortness of breath on exertion, inversion of her left ankle. With regards to the latter complaints she is been to see orthopedics and they offered her a tendon release surgery I believe but wanted her wounds to be closed first. I have recommended she go see her primary physician with regards to everything else. 09/25/17; patient's wounds are about the same size. We have made some progress bilaterally although not in recent weeks. She will not allow me T wash these wounds with Anasept even if she is doing her cell. Wheeze we've been using collagen covered in PolyMen. Last week she had a small area of folliculitis this is now opened into a small wound. She completed 5 days of trimethoprim sulfamethoxazole 10/02/17; unfortunately the area on her left medial ankle is worse with a larger wound area towards the Achilles. The patient complains of a lot of pain. She will not allow debridement although visually I don't think there is anything to debridement in any case. We have been using silver collagen and PolyMen for several months now. Initially we are making some progress although I'm not really seeing that today. We will move back to [facility]. His admittedly this is a bit of a repeat  however I'm hoping that his situation is different now. The patient tells me she had her leg on the left give out on her yesterday this is process some pain. 10/09/17; the patient is seen twice a week largely because of drainage issues coming out of the chronic medial bimalleolar wounds that are chronic. Last week the dimensions of the one on the left looks a little larger I changed her to [facility]. She comes in today with a history of terrible pain in the bilateral wound areas. She will not allow debridement. She will not even allow a tissue culture. There is no surrounding erythema no no evidence of cellulitis. We have been putting her Kerlix Coban man. She will not allow more aggressive compression as there was a suggestion to put her in 3 layer wraps. 10/16/17; large wounds on her bilateral medial malleoli. These are chronic. Not much change from last week. The surface looks have healthy but absolutely no epithelialization. A lot of pain little less so of drainage. She will not allow debridement or even washing these off in the vigorous fashion with Anasept. 10/23/17; large wounds on her bilateral malleoli which are chronic. Some improvement in terms of size perhaps on the right since last time I saw these. She states  that after we increased the 3 layer compression there was some bleeding, when she came in for a nurse visit she did not want 3 layer compression put back on about our nurse managed to convince her. She has known chronic venous visit issues and I'm hoping to get her to tolerate the 3 layer compression. using Hydrofera Blue 10/30/17; absolutely no change in the condition of either wound although we've had some improvement in dimensions on the right.. Attempted to put her in 3 layer compression she didn't tolerated she is back in 2 layer compression. We've been using Hydrofera Blue We looked over her past records. She had venous reflux studies in November 2016. There was no evidence of  deep venous reflux on the right. Superficial vein did not show the greater saphenous vein at think this is been previously ablated the small saphenous vein was within normal limits. The left deep venous system showed no DVT the vessels were positive for deep venous reflux in the posterior tibial veins at the ankle. The greater saphenous vein was surgically absent small saphenous vein was within normal limits. She went to vein and vascular at [facility]. I believe she had an ablation on the left greater saphenous vein. I'll update her reflux studies perhaps ever reviewed by vein and vascular. We've made absolutely no progress in these wounds. Will also try to read and TheraSkins through her insurance 11/06/17; W the patient apparently has a 2 week follow-up with vein and vascular I like him to review the whole issue with regards to her previous vascular workup by Dr. Aleda Grana. We've really made no progress on these wounds in many months. She arrives today with less viable looking surface on the left medial ankle wound. This was apparently looking about the same on Tuesday when she was here for nurse visit. 11/13/17; deep tissue culture I did last time of the left lower leg showed multiple organisms without any predominating. In particular no Staphylococcus or group A strep were isolated. We sent her for venous reflux studies. She's had a previous left greater saphenous vein stripping and I think sclerotherapy of the right greater saphenous vein. She didn't really look at the lesser saphenous vein this both wounds are on the medial aspect. She has reflux in the common femoral vein and popliteal vein and an accessory vein on the right and the common femoral vein and popliteal vein on the left. I'm going to have her go to see vein and vascular just the look over things and see if anything else beside aggressive compression is indicated here. We have not been able to make any progress on these wounds  in spite of the fact that the surface of the wounds is never look too bad. 11/20/17; no major change in the condition of the wounds. Patient reports a large amount of drainage. She has a lot of complaints of pain although enlisting her today I wonder if some of this at least his neuropathic rather than secondary to her wounds. She has an appointment with vein and vascular on 12/30/17. The refractory nature of these wounds in my mind at least need vein and vascular to look over the wounds the recent reflux studies we did and her history to see if anything further can be done here. I also note her gait is deteriorated quite a bit. 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 She has a bilateral Trendelenburg gait. I wonder if this is neuropathic or perhaps multilevel  radicular. 11/27/17; her wounds actually looks slightly better. Healthy-looking granulation tissue a scant amount of epithelialization. [place] healthcare will not pay for [employer]. They will play for tri layer Oasis and Dermagraft. This is not a diabetic ulcer. We'll try for the tri layer Oasis. She still complains of some drainage. She has a vein and vascular appointment on 12/30/17 12/04/17; the wounds visually look quite good. Healthy-looking granulation with some degree of epithelialization. We are still waiting for response to our request for trial to try layer Oasis. Her appointment with vascular to review venous and arterial issues isn't sold the end of July 7/31. Not allow debridement or even vigorous cleansing of the wound surface. 12/18/17; slightly smaller especially on the right. 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 We've been using Hydrofera Blue. We still are looking into triple layer Oasis through her insurance 01/08/18 on evaluation today patient's wound actually appears to be showing signs of good improvement at this point in time. She has been tolerating the dressing changes without  complication. Fortunately there does not appear to be any evidence of infection at this point in time. We have been utilizing silver nitrate which does seem to be of benefit for her which is also good news. Overall I'm very happy with how things seem to be both regards appearance as well as measurement. Patient did see Dr. Bridgett Larsson for evaluation on 12/30/17. In his assessment he felt that stripping would not likely add much more than chronic compression to the patient's healing process. His recommendation was to follow-up in three months with Dr. Doren Custard if she hasn't healed in order to consider referral back to you and see vascular where she previously was in a trial and was able to get her wound to heal. I'll be see what she feels she when you staying compression and he reiterated this as well. 01/13/18 on evaluation today patient appears to actually be doing very well in regard to her bilateral medial malleolus ulcers. She seems to have tolerated the chemical cauterization with silver nitrate last week she did have some pain through that evening but fortunately states that I'll be see since it seems to be doing better she is overall pleased with the progress. 01/21/18; really quite a remarkable improvement since I've last seen these wounds. We started using silver nitrate specially on the islands of hyper granulation which for some reason her around the wound circumference. This is really done quite nicely. Primary dressing Hydrofera Blue under 4 layer compression. She seems to be able to hold out without a nurse rewrap. Follow-up in 1 week 01/28/18; we've continued the hydrofera blue but continued with chemical cauterization to the wound area that we started about a month ago for irregular hyper granulation. She is made almost stunning improvement in the overall wound dimensions. I was not really expecting this degree of improvement in these chronic wounds 02/05/18; we continue with Hydrofera Bluebut of  also continued the aggressive chemical cauterization with silver nitrate. We made nice progress with the right greater than left wound. 02/12/18. We continued with Hydrofera Blue after aggressive chemical cauterization with silver nitrate. We appear to be making nice progress with both wound areas 02/19/2018; we continue with [facility] after washing the wounds vigorously with Anasept spray and chemical cauterization with silver nitrate. We are making excellent progress. The area on the right's just about closed 02/26/2018. The area on the left medial ankle had too much necrotic debris today. I used a #5 curette we  are able to get most of the soft. I continued with the silver nitrate to the much smaller wound on the right medial ankle she had a new area on her right lower pretibial area which she says was due to a role in her compression 03/05/2018; both wound areas look healthy. Not much change in dimensions from last week. I continue to use silver nitrate and Hydrofera Blue. The patient saw Dr. Doren Custard of vein and vascular. He felt she had venous stasis ulcers. He felt based on her previous arteriogram she should have adequate circulation for healing. Also she has deep venous reflux but really no significant correctable superficial venous reflux at this time. He felt we should continue with conservative management including leg elevation and compression 04/02/2018; since we last saw this woman about a month ago she had a fall apparently suffered a pelvic fracture. I did not look up the x-ray. Nevertheless because of pain she literally was bedbound for 2 weeks and had home health coming out to change the dressing. Somewhat predictably this is resulted in considerable improvement in both wound areas. The right is just about closed on the medial malleolus and the left is about half the size. 04/16/2018; both her wounds continue to go down in size. Using Hydrofera Blue. 05/07/18; both her wounds appeared  to be improving especially on the right where it is almost closed. We are using Hydrofera Blue 05/14/2018; slightly worse this week with larger wounds. Surface on the left medial not quite as good. We have been using Hydrofera Blue 05/21/18; again the wounds are slightly larger. Left medial malleolus slightly larger with eschar around the circumference. We have been using Hydrofera Blue undergoing a wraps for a prolonged period of time. This got a lot better when she was more recumbent due to a fall and a back injury. I change the primary dressing the silver alginate today. She did not tolerate a 4 layer compression previously although I may need to bring this up with her next time 05/28/2018; area on the left medial malleolus again is slightly larger with more drainage. Area on the right is roughly unchanged. She has a small area of folliculitis on the right medial just on the lower calf. This does not look ominous. 06/03/2018 left medial malleolus slightly smaller in a better looking surface. We used silver nitrate on this last time with silver alginate. The area on the right appears slightly smaller 1/10; left medial malleolus slightly smaller. Small open area on the right. We used silver nitrate and silver alginate as of 2 weeks ago. We continue with the wound and compression. These got a lot better when she was off her feet 1/17; right medial malleolus wound is smaller. The left may be slightly smaller. Both surfaces look somewhat better. 1/24; both wounds are slightly smaller. Using silver alginate under Unna boots 1/31; both wounds appear smaller in fact the area on the right medial is just about closed. Surface eschar. We have been using silver alginate under Unna boots. The patient is less active now spends let much less time on her feet and I think this is contributed to the general improvement in the wound condition 2/7; both wounds appear smaller. I was hopeful the right medial would be  closed however there there is still the same small open area. Slight amount of surface eschar on the left the dimensions are smaller there is eschar but the wound edges appear to be free. We have been using silver alginate  under Unna boot's 2/14; both wounds once again measure smaller. Circumferential eschar on the left medial. We have been using silver alginate under Unna boots with gradual improvement 2/21; the area on the right medial malleolus has healed. The area on the left is smaller. We have been using silver alginate and Unna boots. We can discharge wrapping the right leg she has 20/30 stockings at home she will need to protect the scar tissue in this area 2/28; the area on the right medial malleolus remains closed the patient has a compression stocking. The area on the left is smaller. We have been using silver alginate and Unna boots. 3/6 the area on the right medial ankle remains closed. Good edema control noted she is using her own compression stocking. The area on the left medial ankle is smaller. We have been managing this with silver alginate and Unna boots which we will continue today. 3/13; the area on the right medial ankle remains closed and I'm declaring it healed today. When necessary the left is about the same still a healthy-looking surface but no major change and wound area. No evidence of infection and using silver alginate under unna and generally making considerable improvement 3/27 the area on the right medial ankle remains closed the area on the left is about the same as last week. Certainly not any worse we have been using silver alginate under an Unna boot 4/3; the area on the right medial ankle remains closed per the patient. We did not look at this wound. The wound on the left medial ankle is about the same surface looks healthy we have been using silver alginate under an Unna boot 4/10; area on the right medial ankle remains closed per the patient. We did not look  at this wound. The wound on the left medial ankle is slightly larger. The patient complains that the [facility] caused burning pain all week. She also told us that she was a lot more active this week. Changed her back to silver alginate 4/17; right medial ankle still closed per the patient. Left medial ankle is slightly larger. Using silver alginate. She did not tolerate Hydrofera Blue on this area 4/24; right medial ankle remains closed we have not look at this. The left medial ankle continues to get larger today by about a centimeter. We have been using silver alginate under Unna boots. She complains about 4 layer compression as an alternative. She has been up on her feet working on her garden 5/8; right medial ankle remains closed we did not look at this. The left medial ankle has increased in size about 100%. We have been using silver alginate under Unna boots. She noted increased pain this week and was not surprised that the wound is deteriorated 5/15; no major change in SA however much less erythema ( one week of doxy ocellulitis). 5/22-64 year old female returns at 1 week to the clinic for left medial ankle wound for which we have been using silver alginate under 3 layer compression She was placed on DOXY at last visit - the wound is wider at this visit. She is in 3 layer compression 5/29; change to [facility] last week. I had given her empiric doxycycline 2 weeks ago for a week. She is in 3 layer compression. She complains of a lot of pain and drainage on presentation today. 6/5; using Hydrofera Blue. I gave her doxycycline recently empirically for erythema and pain around the wound. Believe her cultures showed enterococcus which not would  not have been well covered by doxycycline nevertheless the wound looks better and I don't feel specifically that the enterococcus needs to be covered. She has a new what looks like a wrap injury on her lateral left ankle. 6/12; she is using  Hydrofera Blue. She has a new area on the left anterior lower tibial area. This was a wrap injury last week. 6/19; the patient is using Hydrofera Blue. She arrived with marked inflammation and erythema around the wound and tenderness. 12/01/18 on evaluation today patient appears to be doing a little bit better based on what I'm hearing from the standpoint of lassos evaluation to this as far as the overall appearance of the wound is concerned. Then sometime substandard she typically sees Dr. Dellia Nims. Nonetheless overall very pleased with the progress that she's made up to this point. No fevers, chills, nausea, or vomiting noted at this time. 7/10; some improvement in the surface area. Aggressively debrided last week apparently. I went ahead with the debridement today although the patient does not tolerate this very well. We have been using Iodoflex. Still a fair amount of drainage 7/17; slightly smaller. Using Iodoflex. 7/24; no change from last week in terms of surface area. We have been using Iodoflex. Surface looks and continues to look somewhat better 7/31; surface area slightly smaller better looking surface. We have been using Iodoflex. This is under Unna boot compression 8/7-Patient presents at 1 week with Unna boot and Iodoflex, wound appears better 8/14-Patient presents at 1 week with Iodoflex, we use the Unna boot, wound appears to be stable better.Patient is getting Botox treatment for the inversion of the foot for tendon release, Next week 8/21; we are using Iodoflex. Unna boot. The wound is stable in terms of surface area. Under illumination there is some areas of the wound that appear to be either epithelialized or perhaps this is adherent slough at this point I was not really clear. It did not wipe off and I was reluctant to debride this today. 8/28; we are using Iodoflex in an Unna boot. Seems to be making good improvement. 9/4; using Iodoflex and wound is slightly smaller. 9/18; we are  using Iodoflex with topical silver nitrate when she is here. The wound continues to be smaller 10/2; patient missed her appointment last week due to GI issues. She left and Iodoflex based dressing on for 2 weeks. Wound is about the same size about the size of a dime on the left medial lower 10/9 we have been using Iodoflex on the medial left ankle wound. She has a new superficial probable wrap injury on the dorsal left ankle 10/16; we have been using Hydrofera Blue since last week. This is on the left medial ankle 10/23; we have been using Hydrofera Blue since 2 weeks ago. This is on the left medial ankle. Dimensions are better 11/6; using Hydrofera Blue. I think the wound is smaller but still not closed. Left medial ankle 11/13; we have been using Hydrofera Blue. Wound is certainly no smaller this week. Also the surface not as good. This is the remanent of a very large area on her left medial ankle. 11/20; using Sorbact since last week. Wound was about the same in terms of size although I was disappointed about the surface debris 12/11; 3-week follow-up. Patient was on vacation. Wound is measuring slightly larger we have been using Sorbact. 12/18; wound is about the same size however surface looks better last week after debridement. We have been using Sorbact under  compression 1/15 wound is probably twice the size of last time increased in length nonviable surface. We have been using Sorbact. She was running a mild fever and missed her appointment last week 1/22; the wound is come down in size but under illumination still a very adherent debris we have been Hydrofera Blue that I changed her to last week 1/29; dimensions down slightly. We have been using Hydrofera Blue 2/19 dimensions are the same however there is rims of epithelialization under illumination. Therefore more the surface area may be epithelialized 2/26; the patient's wound actually measures smaller. The wound looks healthy. We have  been using Hydrofera Blue. I had some thoughts about running Apligraf then I still may do that however this looks so much better this week we will delay that for now 3/5; the wound is small but about the same as last week. We have been using Hydrofera Blue. No debridement is required today. 3/19; the wound is about the size of a dime. Healthy looking wound even under illumination. We have been using Hydrofera Blue. No mechanical debridement is necessary 3/26; not much change from last week although still looks very healthy. We have been using Hydrofera Blue under Unna boots Patient was offered an ankle fusion by podiatry but not until the wound heals with a proceed with this. 4/9; the patient comes in today with her original wound on the medial ankle looking satisfactory however she has some uncontrolled swelling in the middle part of her leg with 2 new open areas superiorly just lateral to the tibia. I think this was probably a wrap issue. She said she felt uncomfortable during the week but did not call in. We have been using Hydrofera Blue 4/16; the wound on the medial ankle is about the same. She has innumerable small areas superior to this across her mid tibia. I think this is probably folliculitis. She is also been working in the yard doing a lot of sweating 4/30; the patient issue on the upper areas across her mid tibia of all healed. I think this was excessive yard work if I remember. Her wound on the medial ankle is smaller. Some debris on this we have been using Hydrofera Blue under Unna boots 5/7; mid tibia. She has been using Hydrofera Blue under an Unna wrap. She is apparently going for her ankle surgery on June 3 10/28/19-Patient returns to clinic with the ankle wound, we are using Hydrofera Blue under Unna wrap, surgery is scheduled for her left foot for June 3 so she will be back for nurse visit next week READMISSION 01/17/2020 Mrs. Greenfield is a 64 year old woman we have had in this  clinic for a long period of time with severe venous hypertension and refractory wounds on her medial lower legs and ankles bilaterally. 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 When she was off her legs for a prolonged period example when she fell and suffered a compression fracture things would heal up quite nicely. She is now retired and we managed to heal up the right medial leg wound. The left one was very tiny last time I saw this although still refractory. 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 It got to the point where this was interfering with her walking and she elected to  proceed with a ankle arthrodesis to straighten her her ankle and leave her with a functional outcome for mobilization. The patient was referred to Dr. Doren Custard and really this took some time to arrange. Dr. Doren Custard saw her on 12/07/2019. Once again he verified that she had no arterial issues. She had previously had an angiogram several years ago. Follow-up ABIs on the left showed an ABI of 1.12 with triphasic waveforms and a TBI of 0.92. She is felt to have chronic deep venous insufficiency but I do not think it was felt that anything could be done from about this from an ablation point of view. At the time Dr. Doren Custard saw this patient the wounds actually look closed via the pictures in his clinic. The patient finally underwent her surgery on 12/15/2019. This went reasonably well and there was a good anatomic outcome. She developed a small distal wound dehiscence on the lateral part of the surgical wound. However more problematically she is developed recurrence of the wound on the medial left ankle. There are actually 2 wounds here one in the distal lower leg and 1 pretty much at the level of the medial malleolus. It is  a more distal area that is more problematic. She has been using Hydrofera Blue which started on Friday before this she was simply Ace wrapping. There was a culture done that showed Pseudomonas and she is on ciprofloxacin. A recent CNS on 8/11 was negative. The patient reports some pain but I generally think this is improving. She is using a cam boot completely nonweightbearing using a walker for pivot transfers and a wheelchair 8/24; not much improvement unfortunately she has a surgical wound on the lateral part in the venous insufficiency wound medially. The bottom part of the medial insufficiency wound is still necrotic there is exposed tendon here. We have been using Hydrofera Blue under compression. Her edema control is however better 8/31; patient in for follow-up of his surgical wound on the lateral part of her left leg and chronic venous insufficiency ulcers medially. We put her back in compression last week. She comes in today with a complaint of 3 or 4 days worth of increasing pain. She felt her cam walker was rubbing on the area on the back of her heel. However there is intense erythema seems more likely she has cellulitis. She had 2 cultures done when she was seeing podiatry in the postop. One of them in late July showed Pseudomonas and she received a course of ciprofloxacin the other was negative on 8/11 she is allergic to penicillin with anaphylactoid complaints of hives oral swelling via information in epic 9/9; when I saw this patient last week she had intense anterior erythema around her wound on the right lateral heel and ankle and also into the right medial heel. Some of this was no doubt drainage and her walker boot however I was convinced she had cellulitis. I gave her Levaquin and Bactrim she is finishing up on this now. She is following up with Dr. [name] he saw her yesterday. He is taken her out of the walking boot of course she is still nonweightbearing. Her x-ray was negative  for any worrisome features such as soft tissue air etc. Things are a lot better this week. She has home health. We have been using Hydrofera Blue under an [employer] which she put back on yesterday. I did not wrap her last week 9/17; her surrounding skin looks a lot better. In fact the area on the  left lateral ankle has just a scant amount of eschar. The only remaining wound is the large area on the left medial ankle. Probably about 60% of this is healthy granulation at the surface however she has a significant divot distally. This has adherent debris in it. I been using debridement and silver collagen to try and get this area to fill-in although I do not think we have made much progress this week 9/24; the patient's wound on the left medial ankle looks a lot better. The deeper divot area distally still requires debridement but this is cleaning up quite nicely we have been using silver collagen. The patient is complaining of swelling in her foot and is worried that that is contributing to the nonhealing of the ankle wound. She is also complaining of numbness in her anterior toes 10/4; left medial ankle. The small area distally still has a divot with necrotic material that I have been debriding away. This has an undermining area. She is approved for Apligraf. She saw Dr. [name] her surgeon on 10/1. I think he declared himself is satisfied with the condition of things. Still nonweightbearing till the end of the month. 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 Her surgical wound is well closed. There is no evidence of infection 10/11; the patient arrived in clinic today with the expectation that we be able to put an Apligraf on this area after debridement however she arrives with a relatively large amount of green drainage on the dressing. The patient states that this started on Friday. She has not been systemically unwell. 10/19; culture I did last week showed both  Enterococcus and Pseudomonas. I think this came in separate parts because I stopped her ciprofloxacin I gave her and prescribed her linezolid however now looking at the final culture result this is Pseudomonas which is resistant to quinolones. She has not yet picked up the linezolid apparently phone issues. We are also trying to get a topical antibiotic out of [place] in [place] they can be applied by home health. She is still having green drainage 10/16; the patient has her topical antibiotic from [facility] in [place]. This is a compounded gel with vancomycin and ciprofloxacin and gentamicin. We are applying this on the wound bed with silver alginate over the top with Unna boot wraps. She arrives in clinic today with a lot less ominous looking drainage although she is only use this topical preparation once the second time today. She sees Dr. [name] her surgeon on Friday she has home health changing the dressing 11/2; still using her compounded topical antibiotic under silver alginate. Surface is cleaning up there is less drainage. My plan is to order the Apligraf next week for application 2 weeks from now Objective Constitutional Sitting or standing Blood Pressure is within target range for patient.. Pulse regular and within target range for patient.Marland Kitchen Respirations regular, non-labored and within target range.. Temperature is normal and within the target range for the patient.Marland Kitchen Appears in no distress. Vitals Time Taken: 8:51 AM, Height: 68 in, Temperature: 98.0 F, Pulse: 55 bpm, Respiratory Rate: 18 breaths/min, Blood Pressure: 122/59 mmHg. Cardiovascular Pedal pulses are palpable. General Notes: Wound exam; left medial ankle the most distal part of the wound still has the greatest depth still some gelatinous debris. Under illumination the surface also requires some debridement although I did not do this today however it will certainly need to be done before we apply the  Apligraf. I saw no  evidence of infection around the wound peripheral pulses are palpable and edema is well controlled Integumentary (Hair, Skin) Wound #15 status is Open. Original cause of wound was Gradually Appeared. The wound is located on the Left,Medial Malleolus. The wound measures 4.3cm length x 1.4cm width x 0.8cm depth; 4.728cm^2 area and 3.782cm^3 volume. There is tendon and Fat Layer (Subcutaneous Tissue) exposed. There is no tunneling noted, however, there is undermining starting at 6:00 and ending at 7:00 with a maximum distance of 1cm. There is a medium amount of serosanguineous drainage noted. The wound margin is distinct with the outline attached to the wound base. There is large (67-100%) red, pink granulation within the wound bed. There is a small (1-33%) amount of necrotic tissue within the wound bed including Adherent Slough. Assessment Active Problems ICD-10 Chronic venous hypertension (idiopathic) with ulcer and inflammation of left lower extremity Non-pressure chronic ulcer of other part of left lower leg with other specified severity Non-pressure chronic ulcer of left ankle with other specified severity Procedures Wound #15 Pre-procedure diagnosis of Wound #15 is a Venous Leg Ulcer located on the Left,Medial Malleolus . There was a Haematologist Compression Therapy Procedure by [name], RN. Post procedure Diagnosis Wound #15: Same as Pre-Procedure Plan Follow-up Appointments: Return Appointment in 1 week. Dressing Change Frequency: Wound #15 Left,Medial Malleolus: Change dressing three times week. - wound clinic to change on Mondays, home health to change on Wednesday and Friday Skin Barriers/Peri-Wound Care: Moisturizing lotion - to leg Wound Cleansing: May shower with protection. - use cast protector Primary Wound Dressing: Wound #15 Left,Medial Malleolus: Calcium Alginate with Silver - Apply keystone topical antibiotic medication. Follow the direction on how  to mix and apply per pharmacy directions. Apply under alginate Ag. Patient to bring in keystone topical antibiotic medication to each wound care encounter. Secondary Dressing: Wound #15 Left,Medial Malleolus: Dry Gauze ABD pad Edema Control: Unna Boot to Left Lower Extremity Avoid standing for long periods of time Elevate legs to the level of the heart or above for 30 minutes daily and/or when sitting, a frequency of: - throughout the day Off-Loading: Other: - ensure no pressure to wound areas. Additional Orders / Instructions: Other: - Linezolid antibiotics as prescribed. Home Health: [facility] for [facility] care. - [facility] home [facility] to change twice a week 1. Continue her their own topical antibiotic and silver alginate 2. Likely will require a surface debridement next week 3. Apligraf to order next week 4. Still ABD pads under compression/Unna boots Electronic Signature(s) Signed: 04/03/2020 4:57:00 PM By: Linton Ham MD Entered By: Linton Ham on 04/03/2020 10:08:41 -------------------------------------------------------------------------------- SuperBill Details Patient Name: Date of Service: [name], [name] 04/03/2020 Medical Record Number: [id number] Patient Account Number: [id number] Date of Birth/Sex: Treating RN: [birth year] (64 y.o. [name] Primary Care Provider: [name] Other Clinician: Referring Provider: Treating Provider/Extender: [name] in Treatment: 11 Diagnosis Coding ICD-10 Codes Code Description [phone] Chronic venous hypertension (idiopathic) with ulcer and inflammation of left lower extremity L97.828 Non-pressure chronic ulcer of other part of left lower leg with other specified severity L97.328 Non-pressure chronic ulcer of left ankle with other specified severity Facility Procedures CPT4 Code: [id number] Description: (Facility Use Only) 29580LT - APPLY UNNA BOOT  LT Modifier: Quantity: 1 Physician Procedures : CPT4 Code Description Modifier [id number] - WC PHYS LEVEL 3 - EST PT ICD-10 Diagnosis Description I87.332 Chronic venous hypertension (idiopathic) with ulcer and inflammation of left lower extremity L97.328  Non-pressure chronic ulcer of left ankle  with other specified severity Quantity: 1 Electronic Signature(s) Signed: 04/03/2020 4:57:00 PM By: Linton Ham MD Entered By: Linton Ham on 04/03/2020 10:09:02

## 2020-04-10 ENCOUNTER — Encounter (HOSPITAL_BASED_OUTPATIENT_CLINIC_OR_DEPARTMENT_OTHER): Payer: Medicare Other | Admitting: Internal Medicine

## 2020-04-10 ENCOUNTER — Other Ambulatory Visit: Payer: Self-pay

## 2020-04-10 DIAGNOSIS — I87332 Chronic venous hypertension (idiopathic) with ulcer and inflammation of left lower extremity: Secondary | ICD-10-CM | POA: Diagnosis not present

## 2020-04-11 NOTE — Progress Notes (Signed)
[name], [name] ([id number]) Visit Report for 04/10/2020 Arrival Information Details Patient Name: Date of Service: [name], [name] 04/10/2020 9:15 A M Medical Record Number: [id number] Patient Account Number: [id number] Date of Birth/Sex: Treating RN: Jan 13, 1956 (64 y.o. [name] Primary Care [name]: [name] Other Clinician: Referring Kashonda Sarkisyan: Treating Shariff Lasky/Extender: [name] in Treatment: 12 Visit Information History Since Last Visit Added or deleted any medications: [name] Patient Arrived: [name] Any new allergies or adverse reactions: [name] Arrival Time: 09:03 Had a fall or experienced change in [name] Accompanied By: self activities of daily living that may affect Transfer Assistance: None risk of falls: Patient Identification Verified: Yes Signs or symptoms of abuse/neglect since last visito [name] Secondary Verification Process Completed: Yes Hospitalized since last visit: [name] Patient Requires Transmission-Based Precautions: [name] Implantable device outside of [name] clinic excluding [name] Patient Has Alerts: Yes cellular tissue based products placed in [name] center Patient Alerts: L ABI =1.12, TBI = .92 since last visit: R ABI= 1.02, TBI= .58 Has Dressing in Place as Prescribed: Yes Has Compression in Place as Prescribed: Yes Pain Present Now: Yes Electronic Signature(s) Signed: 04/11/2020 5:57:36 PM By: Baruch Gouty RN, BSN Entered By: Baruch Gouty on 04/10/2020 09:05:41 -------------------------------------------------------------------------------- Encounter Discharge Information Details Patient Name: Date of Service: [name], [name] [name] [name] 04/10/2020 9:15 A M Medical Record Number: [id number] Patient Account Number: [id number] Date of Birth/Sex: Treating RN: 1955/08/30 (64 y.o. [name] Primary Care Shawndell Schillaci: [name] Other Clinician: Referring Zianna Dercole: Treating [name]/Extender: [name] in Treatment: 12 Encounter Discharge Information Items Discharge Condition: Stable Ambulatory Status: Walker Discharge Destination: Home Transportation: Private Auto Accompanied By: self Schedule Follow-up Appointment: Yes Clinical Summary of Care: Electronic Signature(s) Signed: 04/10/2020 5:43:21 PM By: Deon Pilling Entered By: Deon Pilling on 04/10/2020 10:14:01 -------------------------------------------------------------------------------- Lower Extremity Assessment Details Patient Name: Date of Service: [name], [name] 04/10/2020 9:15 A M Medical Record Number: [id number] Patient Account Number: [id number] Date of Birth/Sex: Treating RN: Aug 09, 1955 (64 y.o. [name] Primary Care [name]: [name] Other Clinician: Referring Celsey Asselin: Treating Carreen Milius/Extender: [name] in Treatment: 12 Edema Assessment Assessed: [name]: [name]] [Right: [name]] Edema: [Left: Ye] [Right: s] Calf Left: Right: Point of Measurement: From Medial Instep 30.2 cm Ankle Left: Right: Point of Measurement: From Medial Instep 21.8 cm Vascular Assessment Pulses: Dorsalis Pedis Palpable: [Left:Yes] Electronic Signature(s) Signed: 04/11/2020 5:57:36 PM By: Baruch Gouty RN, BSN Entered By: Baruch Gouty on 04/10/2020 09:15:12 -------------------------------------------------------------------------------- Multi Wound Chart Details Patient Name: Date of Service: [name], [name] [name] [name] 04/10/2020 9:15 A M Medical Record Number: [id number] Patient Account Number: [id number] Date of Birth/Sex: Treating RN: 07-25-1955 (64 y.o. [name] Primary Care Artasia Thang: [name] Other Clinician: Referring Alden Bensinger: Treating Kyisha Fowle/Extender: [name] in Treatment: 12 Vital Signs Height(in): 68 Pulse(bpm): 62 Weight(lbs): Blood Pressure(mmHg): 117/67 Body Mass Index(BMI): Temperature(F): 98.1 Respiratory  Rate(breaths/min): 18 Photos: [15:[name] Photos Left, Medial Malleolus] [N/A:N/A N/A] Wound Location: [15:Gradually Appeared] [N/A:N/A] Wounding Event: [15:Venous Leg Ulcer] [N/A:N/A] Primary Etiology: [15:Peripheral Venous Disease,] [N/A:N/A] Comorbid History: [15:Osteoarthritis, Confinement Anxiety 12/30/2019] [N/A:N/A] Date Acquired: [15:12] [N/A:N/A] Weeks of Treatment: [15:Open] [N/A:N/A] Wound Status: [15:4.4x1.6x1.1] [N/A:N/A] Measurements L x W x D (cm) [15:5.529] [N/A:N/A] A (cm) : rea [15:6.082] [N/A:N/A] Volume (cm) : [15:52.60%] [N/A:N/A] % Reduction in A rea: [15:34.80%] [N/A:N/A] % Reduction in Volume: [15:4] Starting Position 1 (o'clock): [15:6] Ending Position 1 (o'clock): [15:1] Maximum Distance 1 (cm): [  15:Yes] [N/A:N/A] Undermining: [15:Full Thickness With Exposed Support N/A] Classification: [15:Structures Medium] [N/A:N/A] Exudate Amount: [15:Serosanguineous] [N/A:N/A] Exudate Type: [15:red, brown] [N/A:N/A] Exudate Color: [15:Distinct, outline attached] [N/A:N/A] Wound Margin: [15:Medium (34-66%)] [N/A:N/A] Granulation Amount: [15:Red, Pink] [N/A:N/A] Granulation Quality: [15:Medium (34-66%)] [N/A:N/A] Necrotic Amount: [15:Fat Layer (Subcutaneous Tissue): Yes N/A] Exposed Structures: [15:Tendon: Yes Fascia: [name] Muscle: [name] Joint: [name] Bone: [name] Small (1-33%)] [N/A:N/A] Epithelialization: [15:Compression Therapy] [N/A:N/A] Treatment Notes Electronic Signature(s) Signed: 04/11/2020 9:36:19 AM By: Linton Ham MD Signed: 04/11/2020 5:51:14 PM By: [name] RN Entered By: Linton Ham on 04/10/2020 09:40:09 -------------------------------------------------------------------------------- Multi-Disciplinary Care Plan Details Patient Name: Date of Service: [name], [name] [name] [name] 04/10/2020 9:15 A M Medical Record Number: [id number] Patient Account Number: [id number] Date of Birth/Sex: Treating RN: May 19, 1956 (64 y.o. [name] Primary Care Provider:  Lennie [name] Other Clinician: Referring Provider: Treating Provider/Extender: [name] in Treatment: 12 Active Inactive Wound/Skin Impairment Nursing Diagnoses: Impaired tissue integrity Knowledge deficit related to ulceration/compromised skin integrity Goals: Patient/caregiver will verbalize understanding of skin care regimen Date Initiated: 01/17/2020 Target Resolution Date: 05/07/2020 Goal Status: Active Ulcer/skin breakdown will have a volume reduction of 30% by week 4 Date Initiated: 01/17/2020 Date Inactivated: 03/12/2020 Target Resolution Date: 03/09/2020 Goal Status: Met Interventions: Assess patient/caregiver ability to obtain necessary supplies Assess patient/caregiver ability to perform ulcer/skin care regimen upon admission and as needed Assess ulceration(s) every visit Provide education on ulcer and skin care Notes: Electronic Signature(s) Signed: 04/11/2020 5:51:14 PM By: [name] RN Entered By: [name] on 04/10/2020 09:03:19 -------------------------------------------------------------------------------- Pain Assessment Details Patient Name: Date of Service: [name], [name] [name] [name] 04/10/2020 9:15 A M Medical Record Number: [id number] Patient Account Number: [id number] Date of Birth/Sex: Treating RN: 12/21/1955 (64 y.o. [name] Primary Care Provider: Lennie [name] Other Clinician: Referring Provider: Treating Provider/Extender: [name] in Treatment: 12 Active Problems Location of Pain Severity and Description of Pain Patient Has Paino Yes Site Locations Pain Location: Pain in Ulcers With Dressing Change: Yes Duration of [name] Pain. Constant / Intermittento Constant Rate [name] pain. Current Pain Level: 4 Worst Pain Level: 6 Least Pain Level: 2 Character of Pain Describe [name] Pain: Aching, Dull Pain Management and Medication Current Pain Management: Medication: Yes Is [name]  Current Pain Management Adequate: Adequate Rest: Yes How does your wound impact your activities of daily livingo Sleep: [name] Bathing: [name] Appetite: [name] Relationship With Others: [name] Bladder Continence: [name] Emotions: [name] Bowel Continence: [name] Work: [name] Toileting: [name] Drive: [name] Dressing: [name] Hobbies: [name] Electronic Signature(s) Signed: 04/11/2020 5:57:36 PM By: Baruch Gouty RN, BSN Entered By: Baruch Gouty on 04/10/2020 09:06:54 -------------------------------------------------------------------------------- Patient/Caregiver Education Details Patient Name: Date of Service: [name], [name] 11/9/2021andnbsp9:15 A M Medical Record Number: [id number] Patient Account Number: [id number] Date of Birth/Gender: Treating RN: 08/08/1955 (65 y.o. [name] Primary Care Physician: [name] Other Clinician: Referring Physician: Treating Physician/Extender: [name] in Treatment: 12 Education Assessment Education Provided To: Patient Education Topics Provided Wound/Skin Impairment: Methods: Explain/Verbal Responses: State content correctly [employer]) Signed: 04/11/2020 5:51:14 PM By: [name] RN Entered By: [name] on 04/10/2020 09:03:35 -------------------------------------------------------------------------------- Wound Assessment Details Patient Name: Date of Service: [name], [name] [name] [name] 04/10/2020 9:15 A M Medical Record Number: [id number] Patient Account Number: [id number] Date of Birth/Sex: Treating RN: 1955-11-26 (64 y.o. [name] Primary Care Provider: Lennie [name] Other Clinician: Referring Provider: Treating Provider/Extender: [name] in Treatment:  12 Wound Status Wound Number: 15 Primary Venous Leg Ulcer Etiology: Wound Location: Left, Medial Malleolus Wound Status: Open Wounding Event: Gradually Appeared Comorbid Peripheral Venous Disease, Osteoarthritis,  Confinement Date Acquired: 12/30/2019 History: Anxiety Weeks Of Treatment: 12 Clustered Wound: [name] Photos Photo Uploaded By: Mikeal Hawthorne on 04/11/2020 09:49:09 Wound Measurements Length: (cm) 4.4 Width: (cm) 1.6 Depth: (cm) 1.1 Area: (cm) 5.529 Volume: (cm) 6.082 % Reduction in Area: 52.6% % Reduction in Volume: 34.8% Epithelialization: Small (1-33%) Tunneling: [name] Undermining: Yes Starting Position (o'clock): 4 Ending Position (o'clock): 6 Maximum Distance: (cm) 1 Wound Description Classification: Full Thickness With Exposed Support Structures Wound Margin: Distinct, outline attached Exudate Amount: Medium Exudate Type: Serosanguineous Exudate Color: red, brown Foul [name] After Cleansing: [name] Slough/Fibrino Yes Wound Bed Granulation Amount: Medium (34-66%) Exposed Structure Granulation Quality: Red, Pink Fascia Exposed: [name] Necrotic Amount: Medium (34-66%) Fat Layer (Subcutaneous Tissue) Exposed: Yes Necrotic Quality: Adherent Slough Tendon Exposed: Yes Muscle Exposed: [name] Joint Exposed: [name] Bone Exposed: [name] Treatment Notes Wound #15 (Left, Medial Malleolus) 1. Cleanse With Wound Cleanser Soap and water 2. Periwound Care Moisturizing lotion 3. Primary Dressing Applied Other primary dressing (specifiy in notes) 4. Secondary Dressing ABD Pad Dry Gauze 6. Support Layer Production assistant, radio Notes keystone medication applied under [name] apligraft applied by MD secured with adaptic and steri-strips. Electronic Signature(s) Signed: 04/11/2020 5:57:36 PM By: Baruch Gouty RN, BSN Entered By: Baruch Gouty on 04/10/2020 09:17:05 -------------------------------------------------------------------------------- Vitals Details Patient Name: Date of Service: [name], [name] [name] [name] 04/10/2020 9:15 A M Medical Record Number: [id number] Patient Account Number: [id number] Date of Birth/Sex: Treating RN: 1956/04/05 (64 y.o. [name] Primary Care Provider: Lennie [name] Other Clinician: Referring Provider: Treating Provider/Extender: [name] in Treatment: 12 Vital Signs Time Taken: 09:05 Temperature (F): 98.1 Height (in): 68 Pulse (bpm): 64 Source: Stated Respiratory Rate (breaths/min): 18 Source: Stated Blood Pressure (mmHg): 117/67 Reference Range: 80 - 120 mg / dl Electronic Signature(s) Signed: 04/11/2020 5:57:36 PM By: Baruch Gouty RN, BSN Entered By: Baruch Gouty on 04/10/2020 09:06:14

## 2020-04-11 NOTE — Progress Notes (Signed)
[name], [name] ([id number]) Visit Report for 04/10/2020 Cellular or Tissue Based Product Details Patient Name: Date of Service: [facility] [name], [name] 04/10/2020 9:15 A M Medical Record Number: [id number] Patient Account Number: [id number] Date of Birth/Sex: Treating RN: [birth year] (64 y.o. [name] Primary Care Provider: [name] Other Clinician: Referring Provider: Treating Provider/Extender: [name] in Treatment: 12 Cellular or Tissue Based Product Type Wound #15 Left,Medial Malleolus Applied to: Performed By: Physician [name]., MD Cellular or Tissue Based Product Type: Apligraf Level of Consciousness (Pre-procedure): Awake and Alert Pre-procedure Verification/Time Out Yes - 09:33 Taken: Location: trunk / arms / legs Wound Size (sq cm): 7.04 Product Size (sq cm): 44 Waste Size (sq cm): 0 Amount of Product Applied (sq cm): 44 Instrument Used: Blade, Forceps, Scissors Lot #: [id number] Order #: 1 Expiration Date: 04/17/2020 Fenestrated: Yes Instrument: Blade Reconstituted: Yes Solution Type: normal saline Solution Amount: 10 Lot #: [id number] Solution Expiration Date: 01/30/2021 Secured: Yes Secured With: Steri-Strips Dressing Applied: Yes Primary Dressing: saline moist gauze with keystone under apligraft Procedural Pain: 0 Post Procedural Pain: 0 Response to Treatment: Procedure was tolerated well Level of Consciousness (Post- Awake and Alert procedure): Post Procedure Diagnosis Same as Pre-procedure Electronic Signature(s) Signed: 04/11/2020 9:36:19 AM By: Linton Ham MD Signed: 04/11/2020 5:51:14 PM By: [name] RN Entered By: [name] on 04/10/2020 10:45:15 -------------------------------------------------------------------------------- Debridement Details Patient Name: Date of Service: [name], [name] 04/10/2020 9:15 A M Medical Record Number: [id number] Patient Account Number:  [id number] Date of Birth/Sex: Treating RN: 1955-12-19 (64 y.o. [name] Primary Care Provider: [name] Other Clinician: Referring Provider: Treating Provider/Extender: [name] in Treatment: 12 Debridement Performed for Assessment: Wound #15 Left,Medial Malleolus Performed By: Physician [name]., MD Debridement Type: Debridement Severity of Tissue Pre Debridement: Fat layer exposed Level of Consciousness (Pre-procedure): Awake and Alert Pre-procedure Verification/Time Out Yes - 09:29 Taken: Start Time: 09:29 Pain Control: Lidocaine 5% topical ointment T Area Debrided (L x W): otal 4.4 (cm) x 1.6 (cm) = 7.04 (cm) Tissue and other material debrided: Viable, Non-Viable, Slough, Subcutaneous, Skin: Dermis , Skin: Epidermis, Slough Level: Skin/Subcutaneous Tissue Debridement Description: Excisional Instrument: Curette Bleeding: Moderate Hemostasis Achieved: Pressure End Time: 09:34 Procedural Pain: 0 Post Procedural Pain: 0 Response to Treatment: Procedure was tolerated well Level of Consciousness (Post- Awake and Alert procedure): Post Debridement Measurements of Total Wound Length: (cm) 4.4 Width: (cm) 1.6 Depth: (cm) 1.1 Volume: (cm) 6.082 Character of Wound/Ulcer Post Debridement: Improved Severity of Tissue Post Debridement: Fat layer exposed Post Procedure Diagnosis Same as Pre-procedure Electronic Signature(s) Signed: 04/11/2020 9:36:19 AM By: Linton Ham MD Signed: 04/11/2020 5:51:14 PM By: [name] RN Entered By: [name] on 04/10/2020 10:24:08 -------------------------------------------------------------------------------- HPI Details Patient Name: Date of Service: [name], [name] 04/10/2020 9:15 A M Medical Record Number: [id number] Patient Account Number: [id number] Date of Birth/Sex: Treating RN: 1956-03-30 (64 y.o. [name] Primary Care Provider: [name] Other  Clinician: Referring Provider: Treating Provider/Extender: [name] in Treatment: 12 History of Present Illness HPI Description: the remaining wound is over the left medial ankle. Similar wound over the right medial ankle healed largely with use of Apligraf. Most recently we have been using Hydrofera Blue over this wound with considerable improvement. The patient has been extensively worked up in the past for her venous insufficiency and she is not a candidate for antireflux surgery although I  have none of the details available currently. 08/24/14; considerable improvement today. About 50% of this wound areas now epithelialized. The base of the wound appears to be healthier granulation.as opposed to last week when she had deteriorated a considerable improvement 08/17/14; unfortunately the wound has regressed somewhat. The areas of epithelialization from the superior aspect are not nearly as healthy as they were last week. The patient thinks her Hydrofera Blue slipped. 09/07/14; unfortunately the area has markedly regressed in the 2 weeks since I've seen this. There is an odor surrounding erythema. The healthy granulation tissue that we had at the base of the wound now is a dusky color. The nurse reports green drainage 09/14/14; the area looks somewhat better than last week. There is less erythema and less drainage. The culture I did did not show any growth. Nevertheless I think it is better to continue the Cipro and doxycycline for a further week. The remaining wound area was debridement. 09/21/14. Wound did not require debridement last week. Still less erythema and less drainage. She can complete her antibiotics. The areas of epithelialization in the superior aspect of the wound do not look as healthy as they did some weeks ago 10/05/14 continued improvement in the condition of this wound. There is advancing epithelialization. Less aggressive debridement required 10/19/14  continued improvement in the condition and volume of this wound. Less aggressive debridement to the inferior part of this to remove surface slough and fibrinous eschar 11/02/14 no debridement is required. The surface granulation appears healthy although some of her islands of epithelialization seem to have regressed. No evidence of infection 11/16/14; lites surface debridement done of surface eschar. The wound does not look to be unhealthy. No evidence of infection. Unfortunately the patient has had podiatry issues in the right foot and for some reason has redeveloped small surface ulcerations in the medial right ankle. Her original presentation involved wounds in this area 11/23/14 no debridement. The area on the right ankle has enlarged. The left ankle wound appears stable in terms of the surface although there is periwound inflammation. There has been regression in the amount of new skin 11/30/14 no debridement. Both wound areas appear healthy. There was no evidence of infection. The the new area on the right medial ankle has enlarged although that both the surfaces appear to be stable. 12/07/14; Debridement of the right medial ankle wound. No no debridement was done on the left. 12/14/14 no major change in and now bilateral medial ankle wounds. Both of these are very painful but the no overt evidence of infection. She has had previous venous ablation 12/21/14; patient states that her right medial ankle wound is considerably more painful last week than usual. Her left is also somewhat painful. She could not tolerate debridement. The right medial ankle wound has fibrinous surface eschar 12/28/14 this is a patient with severe bilateral venous insufficiency ulcers. For a considerable period of time we actually had the one on the right medial ankle healed however this recently opened up again in June. The left medial ankle wound has been a refractory area with some absent flows. We had some success with  Hydrofera Blue on this area and it literally closed by 50% however it is recently opened up Foley. Both of these were debridement today of surface eschar. She tolerates this poorly 01/25/15: No change in the status of this. Thick adherent escar. Very poor tolerance of any attempt at debridement. I had healed the right medial malleolus wound for a considerable amount of  time and had the left one down to about 50% of the volume although this is totally regressed over the last 48 weeks. Further the right leg has reopened. she is trying to make a appointment with pain and vascular, previous ablations with Dr. Aleda Grana. I do not believe there is an arterial insufficiency issue here 02/01/15 the status of the adherent eschar bilaterally is actually improved. No debridement was done. She did not manage to get vascular studies done 02/08/15 continued debridement of the area was done today. The slough is less adherent and comes off with less pressure. There is no surrounding infection peripheral pulses are intact 02/15/15 selective debridement with a disposable curette. Again the slough is less adherent and comes off with less difficulty. No surrounding infection peripheral pulses are intact. 02/22/15 selective debridement of the right medial ankle wound. Slough comes off with less difficulty. No obvious surrounding infection peripheral pulses are intact I did not debridement the one on the left. Both of these are stable to improved 03/01/15 selective debridement of both wound areas using a curette to. Adherent slough cup soft with less difficulty. No obvious surrounding infection. The patient tells me that 2 days ago she noted a rash above the right leg wrap. She did not have this on her lower legs when she change this over she arrives with widespread left greater than right almost folliculitis-looking rash which is extremely pruritic. I don't see anything to culture here. There is no rash on the rest of her  body. She feels well systemically. 03/08/15; selective debridement of both wounds using a curette. Base of this does not look unhealthy. She had limegreen drainage coming out of the left leg wound and describes a lot of drainage. The rash on her left leg looks improved to. No cultures were done. 03/22/15; patient was not here last week. Basal wounds does not look healthy and there is no surrounding erythema. No drainage. There is still a rash on the left leg that almost looks vasculitic however it is clearly limited to the top of where the wrap would be. 04/05/15; on the right required a surgical debridement of surface eschar and necrotic subcutaneous tissue. I did not debridement the area on the left. These continue to be large open wounds that are not changing that much. We were successful at one point in healing the area on the right, and at the same time the area on the left was roughly half the size of current measurements. I think a lot of the deterioration has to do with the prolonged time the patient is on her feet at work 04/19/15 I attempted-like surface debridement bilaterally she does not tolerate this. She tells me that she was in allergic care yesterday with extreme pain over her left lateral malleolus/ankle and was told that she has an "sprain" 05/03/15; large bilateral venous insufficiency wounds over the medial malleolus/medial aspect of her ankles. She complains of copious amounts of drainage and his usual large amounts of pain. There is some increasing erythema around the wound on the right extending into the medial aspect of her foot to. historically she came in with these wounds the right one healed and the left one came down to roughly half its current size however the right one is reopened and the left is expanded. 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 05/10/15 large bilateral venous insufficiency wounds. There is less  adherence surface  left however the surface culture that I did last week grew pseudomonas therefore bilateral selective debridement score necessary. There is surrounding erythema. The patient describes severe bilateral drainage and a lot of pain in the left ankle. Apparently her podiatrist was were ready to do a cortisone shot 05/17/15; the patient complains of pain and again copious amounts of drainage. 05/24/15; we used Iodo flex last week. Patient notes considerable improvement in wound drainage. Only needed to change this once. 05/31/15; we continued Iodoflex; the base of these large wounds bilaterally is not too bad but there is probably likely a significant bioburden here. I would like to debridement just doesn't tolerate it. 06/06/14 I would like to continue the Iodoflex although she still hasn't managed to obtain supplies. She has bilateral medial malleoli or large wounds which are mostly superficial. Both of them are covered circumferentially with some nonviable fibrinous slough although she tolerates debridement very poorly. She apparently has an appointment for an ablation on the right leg by interventional radiology. 06/14/15; the patient arrives with the wounds and static condition. We attempted a debridement although she does not do well with this secondary to pain. I 07/05/15; wounds are not much smaller however there appears to be a cleaner granulating base. The left has tight fibrinous slough greater than the right. Debridement is tolerated poorly due to pain. Iodoflex is done more for these wounds in any of the multitude of different dressings I have tried on the left 1 and then subsequently the right. 07/12/15; no change in the condition of this wound. I am able to do an aggressive debridement on the right but not the left. She simply cannot tolerate it. We have been using Iodoflex which helps somewhat. It is worthwhile remembering that at one point we healed the right medial ankle wound  and the left was about 25% of the current circumference. We have suggested returning to vascular surgery for review of possible further ablations for one reason or another she has not been able to do this. 07/26/15 no major change in the condition of either wound on her medial ankle. I did not attempt to debridement of these. She has been aggressively scrubbing these while she is in the shower at home. She has her supply of Iodoflex which seems to have done more for these wounds then anything I have put on recently. 08/09/15 wound area appears larger although not verified by measurements. Using Iodoflex 09/05/2015 -- she was here for avisit today but had significant problems with the wound and I was asked to see her for a physician opinion. I have summarize that this lady has had surgery on her left lower extremity about 10 years ago where the possible veins stripping was done. She has had an opinion from interventional radiology around November 2016 where no further sclerotherapy was ordered. The patient works 12 hours a day and stands on a concrete floor with work boots and is unable to get the proper compression she requires and cannot elevate her limbs appropriately at any given time. She has recently grown Pseudomonas from her wound culture but has not started her ciprofloxacin which was called in for her. 09/13/15 this continues to be a difficult situation for this patient. At one point I had this wound down to a 1.5 x 1.5" wound on her left leg. This is deteriorated and the right leg has reopened. She now has substantial wounds on her medial calcaneus, malleoli and into her lower leg. One on the left has  surface eschar but these are far too painful for me to debridement here. She has a vascular surgery appointment next week to see if anything can be done to help here. I think she has had previous ablations several years ago at [facility]. She has no major edema. She tells me that she did not  get product last time [facility] and went for several days without it. She continues to work in work boots 12 hours a day. She cannot get compression/4-layer under her work boots. 09/20/15 no major change. Periwound edema control was not very good. Her point with pain and vascular is next Wednesday the 25th 09/28/15; the patient is seen vascular surgery and is apparently scheduled for repeat duplex ultrasounds of her bilateral lower legs next week. 10/05/15; the patient was seen by Dr. Doren Custard of vascular surgery. He feels that she should have arterial insufficiency excluded as cause/contributed to her nonhealing stage she is therefore booked for an arteriogram. She has apparently monophasic signals in the dorsalis pedis pulses. She also of course has known severe chronic venous insufficiency with previous procedures as noted previously. I had another long discussion with the patient today about her continuing to work 12 hour shifts. I've written her out for 2 months area had concerns about this as her work location is currently undergoing significant turmoil and this may lead to her termination. She is aware of this however I agree with her that she simply cannot continue to stand for 12 hours multiple days a week with the substantial wound areas she has. 10/19/15; the Dr. Doren Custard appointment was largely for an arteriogram which was normal. She does not have an arterial issue. He didn't make a comment about her chronic venous insufficiency for which she has had previous ablations. Presumably it was not felt that anything additional could be done. The patient is now out of work as I prescribed 2 weeks ago. Her wounds look somewhat less aggravated presumably because of this. I felt I would give debridement another try today 10/25/15; no major change in this patient's wounds. We are struggling to get her product that she can afford into her own home through her insurance. 11/01/15; no major change in the  patient's wounds. I have been using silver alginate as the most affordable product. I spoke to Dr. Marla Roe last week with her requested take her to the OR for surgical debridement and placement of ACEL. Dr. Marla Roe told me that she would be willing to do this however [facility] will not cover this, fortunately the patient has [place] healthcare of some variant 11/08/15; no major change in the patient's wounds. She has been completely nonviable surface that this but is in too much pain with any attempted debridement are clinic. I have arranged for her to see Dr. Marla Roe ham of plastic surgery and this appointment is on Monday. I am hopeful that they will take her to the OR for debridement, possible ACEL ultimately possible skin graft 11/22/15 no major change in the patient's wounds over her bilateral medial calcaneus medial malleolus into the lower legs. Surface on these does not look too bad however on the left there is surrounding erythema and tenderness. This may be cellulitis or could him sleepy tinea. 11/29/15; no major changes in the patient's wounds over her bilateral medial malleolus. There is no infection here and I don't think any additional antibiotics are necessary. There is now plan to move forward. She sees Dr. Marla Roe in a week's time  for preparation for operative debridement and ACEL placement I believe on 7/12. She then has a follow-up appointment with Dr. Marla Roe on 7/21 12/28/15; the patient returns today having been taken to the [facility] by Dr. Marla Roe 12/12/15 she underwent debridement, intraoperative cultures [which were negative]. She had placement of a wound VAC. Parent really ACEL was not available to be placed. The wound VAC foam apparently adhered to the wound since then she's been using silver alginate, Xeroform under Ace wraps. She still says there is a lot of drainage and a lot of pain 01/31/16; this is a patient I see monthly. I had referred her to Dr.  Marla Roe him of plastic surgery for large wounds on her bilateral medial ankles. She has been to the OR twice once in early July and once in early August. She tells me over the last 3 weeks she has been using the wound VAC with ACEL underneath it. On the right we've simply been using silver alginate. Under Kerlix Coban wraps. 02/28/16; this is a patient I'm currently seeing monthly. She is gone on to have a skin graft over her large venous insufficiency ulcer on the left medial ankle. This was done by Dr. Marla Roe him. The patient is a bit perturbed about why she didn't have one on her right medial ankle wound. She has been using silver alginate to this. 03/06/16; I received a phone call from her plastic surgery Dr. Marla Roe. She expressed some concern about the viability of the skin graft she did on the left medial ankle wound. Asked me to place Endoform on this. She told me she is not planning to do a subsequent skin graft on the right as the left one did not take very well. I had placed Hydrofera Blue on the right 03/13/16; continue to have a reasonably healthy wound on the right medial ankle. Down to 3 mm in terms of size. There is epithelialization here. The area on the left medial ankle is her skin graft site. I suppose the last week this looks somewhat better. She has an open area inferiorly however in the center there appears to be some viable tissue. There is a lot of surface callus and eschar that will eventually need to come off however none of this looked to be infected. Patient states that the is able to keep the dressing on for several days which is an improvement. 03/20/16 no major change in the circumference of either wound however on the left side the patient was at Dr. Eusebio Friendly office and they did a debridement of left wound. 50% of the wound seems to be epithelialized. I been using Endoform on the left Hydrofera Blue in the right 03/27/16; she arrives today with her wound is  not looking as healthy as they did last week. The area on the right clearly has an adherent surface to this a very similar surface on the left. Unfortunately for this patient this is all too familiar problem. Clearly the Endoform is not working and will need to change that today that has some potential to help this surface. She does not tolerate debridement in this clinic very well. She is changing the dressing wants 04/03/16; patient arrives with the wounds looking somewhat better especially on the right. Dr. Migdalia Dk change the dressing to silver alginate when she saw her on Monday and also sold her some compression socks. The usefulness of the latter is really not clear and woman with severely draining wounds. 04/10/16; the patient is doing a bit  of an experiment wearing the compression stockings that Dr. Migdalia Dk provided her to her left leg and the out of legs based dressings that we provided to the right. 05/01/16; the patient is continuing to wear compression stockings Dr. Migdalia Dk provided her on the left that are apparently silver impregnated. She has been using Iodoflex to the right leg wound. Still a moderate amount of drainage, when she leaves here the wraps only last for 4 days. She has to change the stocking on the left leg every night 05/15/16; she is now using compression stockings bilaterally provided by Dr. Marla Roe. She is wearing a nonadherent layer over the wounds so really I don't think there is anything specific being done to this now. She has some reduction on the left wound. The right is stable. I think all healing here is being done without a specific dressing 06/09/16; patient arrives here today with not much change in the wound certainly in diameter to large circular wounds over the medial aspect of her ankle bilaterally. Under the light of these services are certainly not viable for healing. There is no evidence of surrounding infection. She is wearing compression stockings with  some sort of silver impregnation as prescribed by Dr. Marla Roe. She has a follow-up with her tomorrow. 06/30/16; no major change in the size or condition of her wounds. These are still probably covered with a nonviable surface. She is using only her purchase stockings. She did see Dr. Marla Roe who seemed to want to apply Dakin's solution to this I'm not extreme short what value this would be. I would suggest Iodoflex which she still has at home. 07/28/16; I follow Mrs. Feggins episodically along with Dr. Marla Roe. 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 She has been applying a topical collagen ointment to both wounds with Adaptic. I don't think Dr. Marla Roe is planning to take her back to the OR. 08/19/16; I follow Mrs. Jeneen Rinks on a monthly basis along with Dr. Marla Roe of plastic surgery. 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 I been following her for a number of years. At one point I was able to get the right medial malleolus wound to heal and had the left medial malleolus down to about half its current size however and I had to send her to plastic surgery for an operative debridement. Since then things have been stable to slightly improve the area on the right is slightly better one in the left about the same although there is much less adherent surface than I'm used to with this patient. She is using some form of liquid collagen gel that Dr. Marla Roe provided a Kerlix cover with the patient's own pressure stockings. She tells me that she has extreme pain in both ankles and along the lateral aspect of both feet. She has been unable to work for some period of time. She is telling me she is retiring at the beginning of April. She sees Dr. Doran Durand of orthopedics next week 09/22/16; patient has not seen Dr. Marla Roe since the last time she is here. I'm not  really sure what she is using to the wounds other than bits and pieces of think she had left over including most recently Hydrofera Blue. She is using juxtalite stockings. She is having difficulty with her husband's recent illness "stroke". She is having to transport him to various doctors appointments. Dr. Marla Roe left her the option  of a repeat debridement with ACEL however she has not been able to get the time to follow-up on this. She continues to have a fair amount of drainage out of these wounds with certainly precludes leaving dressings on all week 10/13/16; patient has not seen Dr. Marla Roe since she was last in our clinic. I'm not really sure what she is doing with the wounds, we did try to get her [facility] and I think she is actually using this most of the time. Because of drainage she states she has to change this every second day although this is an improvement from what she used to do. She went to see Dr. Doran Durand who did not think she had a muscular issue with regards to her feet, he referred her to a neurologist and I think the appointment is sometime in June. I changed her back to Iodoflex which she has used in the past but not recently. 11/03/16; the patient has been using Iodoflex although she ran out of this. Still claims that there is a lot of drainage although the wound does not look like this. No surrounding erythema. She has not been back to see Dr. Marla Roe 11/24/16; the patient has been using Iodoflex again but she ran out of it 2 or 3 days ago. There is no major change in the condition of either one of these wounds in fact they are larger and covered in a thick adherent surface slough/nonviable tissue especially on the left. She does not tolerate mechanical debridement in our clinic. Going back to see Dr. Marla Roe of plastic surgery for an operative debridement would seem reasonable. 12/15/16; the patient has not been back to see Dr. Marla Roe. She is been dealing  with a series of illnesses and her husband which of monopolized her time. She is been using Sorbact which we largely supplied. She states the drainage is bad enough that it maximum she can go 2-3 days without changing the dressing 01/12/2017 -- the patient has not been back for about 4 weeks and has not seen Dr. Marla Roe not does she have any appointment pending. 01/23/17; patient has not seen Dr. Marla Roe even though I suggested this previously. She is using Santyl that was suggested last week by Dr. Con Memos this Cost her $16 through her insurance which is indeed surprising 02/12/17; continuing Santyl and the patient is changing this daily. A lot of drainage. She has not been back to see plastic surgery she is using an Ace wrap. Our intake nurse suggested wrap around stockings which would make a good reasonable alternative 02/26/17; patient is been using Santyl and changing this daily due to drainage. She has not been to see plastic surgery she uses in April Ace wrap to control the edema. She did obtain extremitease stockings but stated that the edema in her leg was to big for these 03/20/17; patient is using Santyl and Anasept. Surfaces looked better today the area on the right is actually measuring a little smaller. She has states she has a lot of pain in her feet and ankles and is asking for a consult to pain control which I'll try to help her with through our case manager. 04/10/17; the patient arrives with better-looking wound surfaces and is slightly smaller wound on the left she is using a combination of Santyl and Anasept. She has an appointment or at least as started in the pain control center associated with [facility] 05/14/17; this is a patient who I followed for a prolonged period  of time. 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 At one point I had this down to a much smaller wound on the left however these reopened and we've never been able to get these  to heal. She has been using Santyl and Anasept gel although 2 weeks ago she ran out of the Anasept gel. She has a stable appearance of the wound. She is going to the wound care clinic at [facility]. They wanted do a nerve block/spinal block although she tells me she is reluctant to go forward with that. 05/21/17; this is a patient I have followed for many years. 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 Chronic pain and deformity in her ankles as well. She is been to see plastic surgery as well as orthopedics. Using PolyMem AG most recently/Kerramax/ABDs and 2 layer compression. She has managed to keep this on and she is coming in for a nurse check to change the dressing on Tuesdays, we see her on Fridays 06/05/17; really quite a good looking surface and the area especially on the right medial has contracted in terms of dimensions. Well granulated healthy-looking tissue on both sides. Even with an open curet there is nothing that even feels abnormal here. This is as good as I've seen this in quite some time. We have been using PolyMem AG and bringing her in for a nurse check 06/12/17; really quite good surface on both of these wounds. The right medial has contracted a bit left is not. We've been using PolyMem and AG and she is coming in for a nurse visit 06/19/17; we have been using PolyMem AG and bringing her in for a nurse check. Dimensions of her wounds are not better but the surfaces looked better bilaterally. She complained of bleeding last night and the left wound and increasing pain bilaterally. She states her wound pain is more neuropathic than just the wounds. There was some suggestion that this was radicular from her pain management doctor in talking to her it is really difficult to sort this out. 06/26/17; using PolyMem and AG and bringing her in for a nurse check as All of this and reasonably stable condition. Certainly not improved. The dimensions on the  lateral part of the right leg look better but not really measuring better. The medial aspect on the left is about the same. 07/03/16; we have been using PolyMen AG and bringing her in for a nurse check to change the dressings as the wounds have drainage which precludes once weekly changing. We are using all secondary absorptive dressings.our intake nurse is brought up the idea of using a wound VAC/snap VAC on the wound to help with the drainage to see if this would result in some contraction. This is not a bad idea. The area on the right medial is actually looking smaller. Both wounds have a reasonable-looking surface. There is no evidence of cellulitis. The edema is well controlled 07/10/17; the patient was denied for a snap VAC by her insurance. The major issue with these wounds continues to be drainage. We are using wicked PolyMem AG and she is coming in for a nurse visit to change this. The wounds are stable to slightly improved. The surface looks vibrant and the area on the right certainly has shrunk in size but very slowly 07/17/17; the patient still has large wounds on her bilateral medial malleoli. Surface of both of these wounds looks better. The dimensions seem to come and go but  no consistent improvement. There is no epithelialization. We do not have options for advanced treatment products due to insurance issues. They did not approve of the wound VAC to help control the drainage. More recently we've been using PolyMem and AG wicked to allow drainage through. We have been bringing her in for a nurse visit to change this. We do not have a lot of options for wound care products and the home again due to insurance issues 07/24/17; the patient's wound actually looks somewhat better today. No drainage measurements are smaller still healthy-looking surface. We used silver collagen under PolyMen started last week. We have been bringing her in for a dressing change 07/31/17; patient's wound surface  continued to look better and I think there is visible change in the dimensions of the wound on the right. Rims of epithelialization. We have been using silver collagen under PolyMen and bringing her in for a dressing change. There appears to be less drainage although she is still in need of the dressing change 08/07/17. Patient's wound surface continues to look better on both sides and the area on the right is definitely smaller. We have been using silver collagen and PolyMen. She feels that the drainage has been it has been better. I asked her about her vascular status. She went to see Dr. Aleda Grana at [facility] and had some form of ablation. I don't have much detail on this. I haven't my notes from 2016 that she was not a candidate for any further ablation but I don't have any more information on this. We had referred her to vein and vascular I don't think she ever went. He does not have a history of PAD although I don't have any information on this either. We don't even have ABIs in our record 08/14/17; we've been using silver collagen and PolyMen cover. And putting the patient and compression. She we are bringing her in as a nurse visit to change this because ofarge amount of drainage. We didn't the ABIs in clinic today since they had been done in many moons 1.2 bilaterally. She has been to see vein and vascular however this was at [facility] and she had ablation although I really don't have any information on this all seemed biking get a report. She is also been operatively debrided by plastic surgery and had a cell placed probably 8-12 months ago. This didn't have a major effect. We've been making some gains with current dressings 08/19/17-She is here in follow-up evaluation for bilateral medial malleoli ulcers. She continues to tolerate debridement very poorly. We will continue with recently changed topical treatment; if no significant improvement may consider switching to  Iodosorb/Iodoflex. 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 She has been using silver collagen and PolyMem. I believe she has been used and tried on Iodoflex before. 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 These have been very refractory over time. We have been using silver collagen and PolyMen. She is coming in once a week for a doctor's and once a week for nurse visits. We are actually making some progress 09/18/17; the patient's wounds are smaller especially on the right medial. She arrives today to upset to consider even washing these off with Anasept which I think is been part of the reason  this is been closing. We've been using collagen covered in PolyMen otherwise. It is noted that she has a small area of folliculitis on the right medial calf that. As we are wrapping her legs I'll give her a short course of doxycycline to make sure this doesn't amount to anything. She is a long list of complaints today including imbalance, shortness of breath on exertion, inversion of her left ankle. With regards to the latter complaints she is been to see orthopedics and they offered her a tendon release surgery I believe but wanted her wounds to be closed first. I have recommended she go see her primary physician with regards to everything else. 09/25/17; patient's wounds are about the same size. We have made some progress bilaterally although not in recent weeks. She will not allow me T wash these wounds with Anasept even if she is doing her cell. Wheeze we've been using collagen covered in PolyMen. Last week she had a small area of folliculitis this is now opened into a small wound. She completed 5 days of trimethoprim sulfamethoxazole 10/02/17; unfortunately the area  on her left medial ankle is worse with a larger wound area towards the Achilles. The patient complains of a lot of pain. She will not allow debridement although visually I don't think there is anything to debridement in any case. We have been using silver collagen and PolyMen for several months now. Initially we are making some progress although I'm not really seeing that today. We will move back to [facility]. His admittedly this is a bit of a repeat however I'm hoping that his situation is different now. The patient tells me she had her leg on the left give out on her yesterday this is process some pain. 10/09/17; the patient is seen twice a week largely because of drainage issues coming out of the chronic medial bimalleolar wounds that are chronic. Last week the dimensions of the one on the left looks a little larger I changed her to [facility]. She comes in today with a history of terrible pain in the bilateral wound areas. She will not allow debridement. She will not even allow a tissue culture. There is no surrounding erythema no no evidence of cellulitis. We have been putting her Kerlix Coban man. She will not allow more aggressive compression as there was a suggestion to put her in 3 layer wraps. 10/16/17; large wounds on her bilateral medial malleoli. These are chronic. Not much change from last week. The surface looks have healthy but absolutely no epithelialization. A lot of pain little less so of drainage. She will not allow debridement or even washing these off in the vigorous fashion with Anasept. 10/23/17; large wounds on her bilateral malleoli which are chronic. Some improvement in terms of size perhaps on the right since last time I saw these. She states that after we increased the 3 layer compression there was some bleeding, when she came in for a nurse visit she did not want 3 layer compression put back on about our nurse managed to convince her. She has known chronic venous  visit issues and I'm hoping to get her to tolerate the 3 layer compression. using Hydrofera Blue 10/30/17; absolutely no change in the condition of either wound although we've had some improvement in dimensions on the right.. Attempted to put her in 3 layer compression she didn't tolerated she is back in 2 layer compression. We've been using Hydrofera Blue We looked over her past records. She had  venous reflux studies in November 2016. There was no evidence of deep venous reflux on the right. Superficial vein did not show the greater saphenous vein at think this is been previously ablated the small saphenous vein was within normal limits. The left deep venous system showed no DVT the vessels were positive for deep venous reflux in the posterior tibial veins at the ankle. The greater saphenous vein was surgically absent small saphenous vein was within normal limits. She went to vein and vascular at [facility]. I believe she had an ablation on the left greater saphenous vein. I'll update her reflux studies perhaps ever reviewed by vein and vascular. We've made absolutely no progress in these wounds. Will also try to read and TheraSkins through her insurance 11/06/17; W the patient apparently has a 2 week follow-up with vein and vascular I like him to review the whole issue with regards to her previous vascular workup by Dr. Aleda Grana. We've really made no progress on these wounds in many months. She arrives today with less viable looking surface on the left medial ankle wound. This was apparently looking about the same on Tuesday when she was here for nurse visit. 11/13/17; deep tissue culture I did last time of the left lower leg showed multiple organisms without any predominating. In particular no Staphylococcus or group A strep were isolated. We sent her for venous reflux studies. She's had a previous left greater saphenous vein stripping and I think sclerotherapy of the right greater saphenous  vein. She didn't really look at the lesser saphenous vein this both wounds are on the medial aspect. She has reflux in the common femoral vein and popliteal vein and an accessory vein on the right and the common femoral vein and popliteal vein on the left. I'm going to have her go to see vein and vascular just the look over things and see if anything else beside aggressive compression is indicated here. We have not been able to make any progress on these wounds in spite of the fact that the surface of the wounds is never look too bad. 11/20/17; no major change in the condition of the wounds. Patient reports a large amount of drainage. She has a lot of complaints of pain although enlisting her today I wonder if some of this at least his neuropathic rather than secondary to her wounds. She has an appointment with vein and vascular on 12/30/17. The refractory nature of these wounds in my mind at least need vein and vascular to look over the wounds the recent reflux studies we did and her history to see if anything further can be done here. I also note her gait is deteriorated quite a bit. 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 She has a bilateral Trendelenburg gait. I wonder if this is neuropathic or perhaps multilevel radicular. 11/27/17; her wounds actually looks slightly better. Healthy-looking granulation tissue a scant amount of epithelialization. [place] healthcare will not pay for [employer]. They will play for tri layer Oasis and Dermagraft. This is not a diabetic ulcer. We'll try for the tri layer Oasis. She still complains of some drainage. She has a vein and vascular appointment on 12/30/17 12/04/17; the wounds visually look quite good. Healthy-looking granulation with some degree of epithelialization. We are still waiting for response to our request for trial to try layer Oasis. Her appointment with vascular to review venous and arterial issues isn't sold the end of July 7/31. Not  allow debridement or  even vigorous cleansing of the wound surface. 12/18/17; slightly smaller especially on the right. 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 We've been using Hydrofera Blue. We still are looking into triple layer Oasis through her insurance 01/08/18 on evaluation today patient's wound actually appears to be showing signs of good improvement at this point in time. She has been tolerating the dressing changes without complication. Fortunately there does not appear to be any evidence of infection at this point in time. We have been utilizing silver nitrate which does seem to be of benefit for her which is also good news. Overall I'm very happy with how things seem to be both regards appearance as well as measurement. Patient did see Dr. Bridgett Larsson for evaluation on 12/30/17. In his assessment he felt that stripping would not likely add much more than chronic compression to the patient's healing process. His recommendation was to follow-up in three months with Dr. Doren Custard if she hasn't healed in order to consider referral back to you and see vascular where she previously was in a trial and was able to get her wound to heal. I'll be see what she feels she when you staying compression and he reiterated this as well. 01/13/18 on evaluation today patient appears to actually be doing very well in regard to her bilateral medial malleolus ulcers. She seems to have tolerated the chemical cauterization with silver nitrate last week she did have some pain through that evening but fortunately states that I'll be see since it seems to be doing better she is overall pleased with the progress. 01/21/18; really quite a remarkable improvement since I've last seen these wounds. We started using silver nitrate specially on the islands of hyper granulation which for some reason her around the wound circumference. This is really done quite nicely. Primary dressing Hydrofera Blue under 4  layer compression. She seems to be able to hold out without a nurse rewrap. Follow-up in 1 week 01/28/18; we've continued the hydrofera blue but continued with chemical cauterization to the wound area that we started about a month ago for irregular hyper granulation. She is made almost stunning improvement in the overall wound dimensions. I was not really expecting this degree of improvement in these chronic wounds 02/05/18; we continue with Hydrofera Bluebut of also continued the aggressive chemical cauterization with silver nitrate. We made nice progress with the right greater than left wound. 02/12/18. We continued with Hydrofera Blue after aggressive chemical cauterization with silver nitrate. We appear to be making nice progress with both wound areas 02/19/2018; we continue with [facility] after washing the wounds vigorously with Anasept spray and chemical cauterization with silver nitrate. We are making excellent progress. The area on the right's just about closed 02/26/2018. The area on the left medial ankle had too much necrotic debris today. I used a #5 curette we are able to get most of the soft. I continued with the silver nitrate to the much smaller wound on the right medial ankle she had a new area on her right lower pretibial area which she says was due to a role in her compression 03/05/2018; both wound areas look healthy. Not much change in dimensions from last week. I continue to use silver nitrate and Hydrofera Blue. The patient saw Dr. Doren Custard of vein and vascular. He felt she had venous stasis ulcers. He felt based on her previous arteriogram she should have adequate circulation for healing. Also she has deep venous reflux but really no significant correctable superficial  venous reflux at this time. He felt we should continue with conservative management including leg elevation and compression 04/02/2018; since we last saw this woman about a month ago she had a fall apparently  suffered a pelvic fracture. I did not look up the x-ray. Nevertheless because of pain she literally was bedbound for 2 weeks and had home health coming out to change the dressing. Somewhat predictably this is resulted in considerable improvement in both wound areas. The right is just about closed on the medial malleolus and the left is about half the size. 04/16/2018; both her wounds continue to go down in size. Using Hydrofera Blue. 05/07/18; both her wounds appeared to be improving especially on the right where it is almost closed. We are using Hydrofera Blue 05/14/2018; slightly worse this week with larger wounds. Surface on the left medial not quite as good. We have been using Hydrofera Blue 05/21/18; again the wounds are slightly larger. Left medial malleolus slightly larger with eschar around the circumference. We have been using Hydrofera Blue undergoing a wraps for a prolonged period of time. This got a lot better when she was more recumbent due to a fall and a back injury. I change the primary dressing the silver alginate today. She did not tolerate a 4 layer compression previously although I may need to bring this up with her next time 05/28/2018; area on the left medial malleolus again is slightly larger with more drainage. Area on the right is roughly unchanged. She has a small area of folliculitis on the right medial just on the lower calf. This does not look ominous. 06/03/2018 left medial malleolus slightly smaller in a better looking surface. We used silver nitrate on this last time with silver alginate. The area on the right appears slightly smaller 1/10; left medial malleolus slightly smaller. Small open area on the right. We used silver nitrate and silver alginate as of 2 weeks ago. We continue with the wound and compression. These got a lot better when she was off her feet 1/17; right medial malleolus wound is smaller. The left may be slightly smaller. Both surfaces look somewhat  better. 1/24; both wounds are slightly smaller. Using silver alginate under Unna boots 1/31; both wounds appear smaller in fact the area on the right medial is just about closed. Surface eschar. We have been using silver alginate under Unna boots. The patient is less active now spends let much less time on her feet and I think this is contributed to the general improvement in the wound condition 2/7; both wounds appear smaller. I was hopeful the right medial would be closed however there there is still the same small open area. Slight amount of surface eschar on the left the dimensions are smaller there is eschar but the wound edges appear to be free. We have been using silver alginate under Unna boot's 2/14; both wounds once again measure smaller. Circumferential eschar on the left medial. We have been using silver alginate under Unna boots with gradual improvement 2/21; the area on the right medial malleolus has healed. The area on the left is smaller. We have been using silver alginate and Unna boots. We can discharge wrapping the right leg she has 20/30 stockings at home she will need to protect the scar tissue in this area 2/28; the area on the right medial malleolus remains closed the patient has a compression stocking. The area on the left is smaller. We have been using silver alginate and Unna boots. 3/6  the area on the right medial ankle remains closed. Good edema control noted she is using her own compression stocking. The area on the left medial ankle is smaller. We have been managing this with silver alginate and Unna boots which we will continue today. 3/13; the area on the right medial ankle remains closed and I'm declaring it healed today. When necessary the left is about the same still a healthy-looking surface but no major change and wound area. No evidence of infection and using silver alginate under unna and generally making considerable improvement 3/27 the area on the right  medial ankle remains closed the area on the left is about the same as last week. Certainly not any worse we have been using silver alginate under an Unna boot 4/3; the area on the right medial ankle remains closed per the patient. We did not look at this wound. The wound on the left medial ankle is about the same surface looks healthy we have been using silver alginate under an Unna boot 4/10; area on the right medial ankle remains closed per the patient. We did not look at this wound. The wound on the left medial ankle is slightly larger. The patient complains that the [facility] caused burning pain all week. She also told us that she was a lot more active this week. Changed her back to silver alginate 4/17; right medial ankle still closed per the patient. Left medial ankle is slightly larger. Using silver alginate. She did not tolerate Hydrofera Blue on this area 4/24; right medial ankle remains closed we have not look at this. The left medial ankle continues to get larger today by about a centimeter. We have been using silver alginate under Unna boots. She complains about 4 layer compression as an alternative. She has been up on her feet working on her garden 5/8; right medial ankle remains closed we did not look at this. The left medial ankle has increased in size about 100%. We have been using silver alginate under Unna boots. She noted increased pain this week and was not surprised that the wound is deteriorated 5/15; no major change in SA however much less erythema ( one week of doxy ocellulitis). 5/22-64 year old female returns at 1 week to the clinic for left medial ankle wound for which we have been using silver alginate under 3 layer compression She was placed on DOXY at last visit - the wound is wider at this visit. She is in 3 layer compression 5/29; change to [facility] last week. I had given her empiric doxycycline 2 weeks ago for a week. She is in 3 layer compression. She  complains of a lot of pain and drainage on presentation today. 6/5; using Hydrofera Blue. I gave her doxycycline recently empirically for erythema and pain around the wound. Believe her cultures showed enterococcus which not would not have been well covered by doxycycline nevertheless the wound looks better and I don't feel specifically that the enterococcus needs to be covered. She has a new what looks like a wrap injury on her lateral left ankle. 6/12; she is using Hydrofera Blue. She has a new area on the left anterior lower tibial area. This was a wrap injury last week. 6/19; the patient is using Hydrofera Blue. She arrived with marked inflammation and erythema around the wound and tenderness. 12/01/18 on evaluation today patient appears to be doing a little bit better based on what I'm hearing from the standpoint of lassos evaluation to this as  far as the overall appearance of the wound is concerned. Then sometime substandard she typically sees Dr. Dellia Nims. Nonetheless overall very pleased with the progress that she's made up to this point. No fevers, chills, nausea, or vomiting noted at this time. 7/10; some improvement in the surface area. Aggressively debrided last week apparently. I went ahead with the debridement today although the patient does not tolerate this very well. We have been using Iodoflex. Still a fair amount of drainage 7/17; slightly smaller. Using Iodoflex. 7/24; no change from last week in terms of surface area. We have been using Iodoflex. Surface looks and continues to look somewhat better 7/31; surface area slightly smaller better looking surface. We have been using Iodoflex. This is under Unna boot compression 8/7-Patient presents at 1 week with Unna boot and Iodoflex, wound appears better 8/14-Patient presents at 1 week with Iodoflex, we use the Unna boot, wound appears to be stable better.Patient is getting Botox treatment for the inversion of the foot for tendon  release, Next week 8/21; we are using Iodoflex. Unna boot. The wound is stable in terms of surface area. Under illumination there is some areas of the wound that appear to be either epithelialized or perhaps this is adherent slough at this point I was not really clear. It did not wipe off and I was reluctant to debride this today. 8/28; we are using Iodoflex in an Unna boot. Seems to be making good improvement. 9/4; using Iodoflex and wound is slightly smaller. 9/18; we are using Iodoflex with topical silver nitrate when she is here. The wound continues to be smaller 10/2; patient missed her appointment last week due to GI issues. She left and Iodoflex based dressing on for 2 weeks. Wound is about the same size about the size of a dime on the left medial lower 10/9 we have been using Iodoflex on the medial left ankle wound. She has a new superficial probable wrap injury on the dorsal left ankle 10/16; we have been using Hydrofera Blue since last week. This is on the left medial ankle 10/23; we have been using Hydrofera Blue since 2 weeks ago. This is on the left medial ankle. Dimensions are better 11/6; using Hydrofera Blue. I think the wound is smaller but still not closed. Left medial ankle 11/13; we have been using Hydrofera Blue. Wound is certainly no smaller this week. Also the surface not as good. This is the remanent of a very large area on her left medial ankle. 11/20; using Sorbact since last week. Wound was about the same in terms of size although I was disappointed about the surface debris 12/11; 3-week follow-up. Patient was on vacation. Wound is measuring slightly larger we have been using Sorbact. 12/18; wound is about the same size however surface looks better last week after debridement. We have been using Sorbact under compression 1/15 wound is probably twice the size of last time increased in length nonviable surface. We have been using Sorbact. She was running a mild fever  and missed her appointment last week 1/22; the wound is come down in size but under illumination still a very adherent debris we have been Hydrofera Blue that I changed her to last week 1/29; dimensions down slightly. We have been using Hydrofera Blue 2/19 dimensions are the same however there is rims of epithelialization under illumination. Therefore more the surface area may be epithelialized 2/26; the patient's wound actually measures smaller. The wound looks healthy. We have been using Hydrofera Blue. I  had some thoughts about running Apligraf then I still may do that however this looks so much better this week we will delay that for now 3/5; the wound is small but about the same as last week. We have been using Hydrofera Blue. No debridement is required today. 3/19; the wound is about the size of a dime. Healthy looking wound even under illumination. We have been using Hydrofera Blue. No mechanical debridement is necessary 3/26; not much change from last week although still looks very healthy. We have been using Hydrofera Blue under Unna boots Patient was offered an ankle fusion by podiatry but not until the wound heals with a proceed with this. 4/9; the patient comes in today with her original wound on the medial ankle looking satisfactory however she has some uncontrolled swelling in the middle part of her leg with 2 new open areas superiorly just lateral to the tibia. I think this was probably a wrap issue. She said she felt uncomfortable during the week but did not call in. We have been using Hydrofera Blue 4/16; the wound on the medial ankle is about the same. She has innumerable small areas superior to this across her mid tibia. I think this is probably folliculitis. She is also been working in the yard doing a lot of sweating 4/30; the patient issue on the upper areas across her mid tibia of all healed. I think this was excessive yard work if I remember. Her wound on the medial ankle is  smaller. Some debris on this we have been using Hydrofera Blue under Unna boots 5/7; mid tibia. She has been using Hydrofera Blue under an Unna wrap. She is apparently going for her ankle surgery on June 3 10/28/19-Patient returns to clinic with the ankle wound, we are using Hydrofera Blue under Unna wrap, surgery is scheduled for her left foot for June 3 so she will be back for nurse visit next week READMISSION 01/17/2020 Mrs. Kawamoto is a 64 year old woman we have had in this clinic for a long period of time with severe venous hypertension and refractory wounds on her medial lower legs and ankles bilaterally. 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 When she was off her legs for a prolonged period example when she fell and suffered a compression fracture things would heal up quite nicely. She is now retired and we managed to heal up the right medial leg wound. The left one was very tiny last time I saw this although still refractory. 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 It got to the point where this was interfering with her walking and she elected to proceed with a ankle arthrodesis to straighten her her ankle and leave her with a functional outcome for mobilization. The patient was referred to Dr. Doren Custard and really this took some time to arrange. Dr. Doren Custard saw her on 12/07/2019. Once again he verified that she had no arterial issues. She had previously had an angiogram several years ago. Follow-up ABIs on the left showed an ABI of 1.12 with triphasic waveforms and a TBI of 0.92. She is felt to have chronic deep venous insufficiency but I do not think it was felt that anything could be done from about this from an ablation point of view. At the time  Dr. Doren Custard saw this patient the wounds  actually look closed via the pictures in his clinic. The patient finally underwent her surgery on 12/15/2019. This went reasonably well and there was a good anatomic outcome. She developed a small distal wound dehiscence on the lateral part of the surgical wound. However more problematically she is developed recurrence of the wound on the medial left ankle. There are actually 2 wounds here one in the distal lower leg and 1 pretty much at the level of the medial malleolus. It is a more distal area that is more problematic. She has been using Hydrofera Blue which started on Friday before this she was simply Ace wrapping. There was a culture done that showed Pseudomonas and she is on ciprofloxacin. A recent CNS on 8/11 was negative. The patient reports some pain but I generally think this is improving. She is using a cam boot completely nonweightbearing using a walker for pivot transfers and a wheelchair 8/24; not much improvement unfortunately she has a surgical wound on the lateral part in the venous insufficiency wound medially. The bottom part of the medial insufficiency wound is still necrotic there is exposed tendon here. We have been using Hydrofera Blue under compression. Her edema control is however better 8/31; patient in for follow-up of his surgical wound on the lateral part of her left leg and chronic venous insufficiency ulcers medially. We put her back in compression last week. She comes in today with a complaint of 3 or 4 days worth of increasing pain. She felt her cam walker was rubbing on the area on the back of her heel. However there is intense erythema seems more likely she has cellulitis. She had 2 cultures done when she was seeing podiatry in the postop. One of them in late July showed Pseudomonas and she received a course of ciprofloxacin the other was negative on 8/11 she is allergic to penicillin with anaphylactoid complaints of hives oral swelling via information in epic 9/9;  when I saw this patient last week she had intense anterior erythema around her wound on the right lateral heel and ankle and also into the right medial heel. Some of this was no doubt drainage and her walker boot however I was convinced she had cellulitis. I gave her Levaquin and Bactrim she is finishing up on this now. She is following up with Dr. [name] he saw her yesterday. He is taken her out of the walking boot of course she is still nonweightbearing. Her x-ray was negative for any worrisome features such as soft tissue air etc. Things are a lot better this week. She has home health. We have been using Hydrofera Blue under an [employer] which she put back on yesterday. I did not wrap her last week 9/17; her surrounding skin looks a lot better. In fact the area on the left lateral ankle has just a scant amount of eschar. The only remaining wound is the large area on the left medial ankle. Probably about 60% of this is healthy granulation at the surface however she has a significant divot distally. This has adherent debris in it. I been using debridement and silver collagen to try and get this area to fill-in although I do not think we have made much progress this week 9/24; the patient's wound on the left medial ankle looks a lot better. The deeper divot area distally still requires debridement but this is cleaning up quite nicely we have been using silver collagen. The patient is  complaining of swelling in her foot and is worried that that is contributing to the nonhealing of the ankle wound. She is also complaining of numbness in her anterior toes 10/4; left medial ankle. The small area distally still has a divot with necrotic material that I have been debriding away. This has an undermining area. She is approved for Apligraf. She saw Dr. [name] her surgeon on 10/1. I think he declared himself is satisfied with the condition of things. Still nonweightbearing till the end of the month. We are  dealing with the venous insufficiency wounds on the medial ankle. Her surgical wound is well closed. There is no evidence of infection 10/11; the patient arrived in clinic today with the expectation that we be able to put an Apligraf on this area after debridement however she arrives with a relatively large amount of green drainage on the dressing. The patient states that this started on Friday. She has not been systemically unwell. 10/19; culture I did last week showed both Enterococcus and Pseudomonas. I think this came in separate parts because I stopped her ciprofloxacin I gave her and prescribed her linezolid however now looking at the final culture result this is Pseudomonas which is resistant to quinolones. She has not yet picked up the linezolid apparently phone issues. We are also trying to get a topical antibiotic out of [place] in [place] they can be applied by home health. She is still having green drainage 10/16; the patient has her topical antibiotic from [facility] in [place]. This is a compounded gel with vancomycin and ciprofloxacin and gentamicin. We are applying this on the wound bed with silver alginate over the top with Unna boot wraps. She arrives in clinic today with a lot less ominous looking drainage although she is only use this topical preparation once the second time today. She sees Dr. [name] her surgeon on Friday she has home health changing the dressing 11/2; still using her compounded topical antibiotic under silver alginate. Surface is cleaning up there is less drainage. We had an Apligraf for her today and I elected to apply it. A light coating of her antibiotic Electronic Signature(s) Signed: 04/11/2020 9:36:19 AM By: Linton Ham MD Entered By: Linton Ham on 04/10/2020 09:58:42 -------------------------------------------------------------------------------- Physical Exam Details Patient Name: Date of Service: [name], [name]  04/10/2020 9:15 A M Medical Record Number: [id number] Patient Account Number: [id number] Date of Birth/Sex: Treating RN: 11/27/1955 (64 y.o. [name] Primary Care Provider: [name] Other Clinician: Referring Provider: Treating Provider/Extender: [name] in Treatment: 12 Constitutional Sitting or standing Blood Pressure is within target range for patient.. Pulse regular and within target range for patient.Marland Kitchen Respirations regular, non-labored and within target range.. Temperature is normal and within the target range for the patient.Marland Kitchen Appears in no distress. Notes Wound exam; left medial ankle the most distal part of the wound has the greatest depth. Surface looks really healthy although there was some gelatinous subcutaneous debris debris that I removed from a #5 curette. The base of this then cleaned up quite nicely. Apligraf #1 applied Electronic Signature(s) Signed: 04/11/2020 9:36:19 AM By: Linton Ham MD Entered By: Linton Ham on 04/10/2020 09:59:30 -------------------------------------------------------------------------------- Physician Orders Details Patient Name: Date of Service: [name], [name] 04/10/2020 9:15 A M Medical Record Number: [id number] Patient Account Number: [id number] Date of Birth/Sex: Treating RN: 1956/03/25 (64 y.o. [name] Primary Care Provider: [name] Other Clinician: Referring Provider: Treating Provider/Extender: Dellia Nims  Myrlene Broker Weeks in Treatment: 12 Verbal / Phone Orders: No Diagnosis Coding ICD-10 Coding Code Description I87.332 Chronic venous hypertension (idiopathic) with ulcer and inflammation of left lower extremity L97.828 Non-pressure chronic ulcer of other part of left lower leg with other specified severity L97.328 Non-pressure chronic ulcer of left ankle with other specified severity Follow-up Appointments Return Appointment in 2 weeks. Nurse Visit: -  tuesday Dressing Change Frequency Wound #15 Left,Medial Malleolus Do not change entire dressing for one week. - nov 16 patient to be seen by home health, outter dressing change only , DO NOT remove adaptic or steri strips Skin Barriers/Peri-Wound Care Moisturizing lotion - to leg Wound Cleansing May shower with protection. - use cast protector Primary Wound Dressing Wound #15 Left,Medial Malleolus pplication - APLI Graft #1 , apply thin layer of keystone gel , mix as ordered Skin Substitute A Other: - saline moist gauze over adaptic/steri strips Secondary Dressing Wound #15 Left,Medial Malleolus ABD pad Edema Control Unna Boot to Left Lower Extremity Avoid standing for long periods of time Elevate legs to the level of the heart or above for 30 minutes daily and/or when sitting, a frequency of: - throughout the day Off-Loading Other: - ensure no pressure to wound areas. [place] skilled nursing for wound care. - [facility] to see on Thursday, then weekly Electronic Signature(s) Signed: 04/11/2020 9:36:19 AM By: Linton Ham MD Signed: 04/11/2020 5:51:14 PM By: [name] RN Entered By: [name] on 04/10/2020 09:57:32 -------------------------------------------------------------------------------- Problem List Details Patient Name: Date of Service: [facility] [name], [name] 04/10/2020 9:15 A M Medical Record Number: [id number] Patient Account Number: [id number] Date of Birth/Sex: Treating RN: 1955/12/02 (64 y.o. 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 04/10/2020 9:15 A M Medical Record Number: [id number] Patient Account Number: [id number] Date of Birth/Sex: Treating RN: 1955-07-09 (64 y.o. [name] Primary Care Provider: [name] Other Clinician: Referring Provider: Treating Provider/Extender: [name] in Treatment: 12 Subjective History of Present Illness (HPI) the remaining wound is over the left medial ankle. Similar wound over the right medial ankle healed largely with use of Apligraf. Most recently we have been using Hydrofera Blue over this wound with considerable improvement. The patient has been extensively worked up in the past for her venous insufficiency and she is not a candidate for antireflux surgery although I have none of the details available currently. 08/24/14; considerable improvement today. About 50% of this wound areas now epithelialized. The base of the wound appears to be healthier granulation.as opposed to last week when she had deteriorated a considerable improvement 08/17/14; unfortunately the wound has regressed somewhat. The areas of epithelialization from  the superior aspect are not nearly as healthy as they were last week. The patient thinks her Hydrofera Blue  slipped. 09/07/14; unfortunately the area has markedly regressed in the 2 weeks since I've seen this. There is an odor surrounding erythema. The healthy granulation tissue that we had at the base of the wound now is a dusky color. The nurse reports green drainage 09/14/14; the area looks somewhat better than last week. There is less erythema and less drainage. The culture I did did not show any growth. Nevertheless I think it is better to continue the Cipro and doxycycline for a further week. The remaining wound area was debridement. 09/21/14. Wound did not require debridement last week. Still less erythema and less drainage. She can complete her antibiotics. The areas of epithelialization in the superior aspect of the wound do not look as healthy as they did some weeks ago 10/05/14 continued improvement in the condition of this wound. There is advancing epithelialization. Less aggressive debridement required 10/19/14 continued improvement in the condition and volume of this wound. Less aggressive debridement to the inferior part of this to remove surface slough and fibrinous eschar 11/02/14 no debridement is required. The surface granulation appears healthy although some of her islands of epithelialization seem to have regressed. No evidence of infection 11/16/14; lites surface debridement done of surface eschar. The wound does not look to be unhealthy. No evidence of infection. Unfortunately the patient has had podiatry issues in the right foot and for some reason has redeveloped small surface ulcerations in the medial right ankle. Her original presentation involved wounds in this area 11/23/14 no debridement. The area on the right ankle has enlarged. The left ankle wound appears stable in terms of the surface although there is periwound inflammation. There has been regression in the amount of new skin 11/30/14 no debridement. Both wound areas appear healthy. There was no evidence of infection. The the new  area on the right medial ankle has enlarged although that both the surfaces appear to be stable. 12/07/14; Debridement of the right medial ankle wound. No no debridement was done on the left. 12/14/14 no major change in and now bilateral medial ankle wounds. Both of these are very painful but the no overt evidence of infection. She has had previous venous ablation 12/21/14; patient states that her right medial ankle wound is considerably more painful last week than usual. Her left is also somewhat painful. She could not tolerate debridement. The right medial ankle wound has fibrinous surface eschar 12/28/14 this is a patient with severe bilateral venous insufficiency ulcers. For a considerable period of time we actually had the one on the right medial ankle healed however this recently opened up again in June. The left medial ankle wound has been a refractory area with some absent flows. We had some success with Hydrofera Blue on this area and it literally closed by 50% however it is recently opened up Foley. Both of these were debridement today of surface eschar. She tolerates this poorly 01/25/15: No change in the status of this. Thick adherent escar. Very poor tolerance of any attempt at debridement. I had healed the right medial malleolus wound for a considerable amount of time and had the left one down to about 50% of the volume although this is totally regressed over the last 48 weeks. Further the right leg has reopened. she is trying to make a appointment with pain and vascular, previous ablations with Dr. Aleda Grana. I do not believe there is an  arterial insufficiency issue here 02/01/15 the status of the adherent eschar bilaterally is actually improved. No debridement was done. She did not manage to get vascular studies done 02/08/15 continued debridement of the area was done today. The slough is less adherent and comes off with less pressure. There is no surrounding infection peripheral pulses are  intact 02/15/15 selective debridement with a disposable curette. Again the slough is less adherent and comes off with less difficulty. No surrounding infection peripheral pulses are intact. 02/22/15 selective debridement of the right medial ankle wound. Slough comes off with less difficulty. No obvious surrounding infection peripheral pulses are intact I did not debridement the one on the left. Both of these are stable to improved 03/01/15 selective debridement of both wound areas using a curette to. Adherent slough cup soft with less difficulty. No obvious surrounding infection. The patient tells me that 2 days ago she noted a rash above the right leg wrap. She did not have this on her lower legs when she change this over she arrives with widespread left greater than right almost folliculitis-looking rash which is extremely pruritic. I don't see anything to culture here. There is no rash on the rest of her body. She feels well systemically. 03/08/15; selective debridement of both wounds using a curette. Base of this does not look unhealthy. She had limegreen drainage coming out of the left leg wound and describes a lot of drainage. The rash on her left leg looks improved to. No cultures were done. 03/22/15; patient was not here last week. Basal wounds does not look healthy and there is no surrounding erythema. No drainage. There is still a rash on the left leg that almost looks vasculitic however it is clearly limited to the top of where the wrap would be. 04/05/15; on the right required a surgical debridement of surface eschar and necrotic subcutaneous tissue. I did not debridement the area on the left. These continue to be large open wounds that are not changing that much. We were successful at one point in healing the area on the right, and at the same time the area on the left was roughly half the size of current measurements. I think a lot of the deterioration has to do with the prolonged time the  patient is on her feet at work 04/19/15 I attempted-like surface debridement bilaterally she does not tolerate this. She tells me that she was in allergic care yesterday with extreme pain over her left lateral malleolus/ankle and was told that she has an "sprain" 05/03/15; large bilateral venous insufficiency wounds over the medial malleolus/medial aspect of her ankles. She complains of copious amounts of drainage and his usual large amounts of pain. There is some increasing erythema around the wound on the right extending into the medial aspect of her foot to. historically she came in with these wounds the right one healed and the left one came down to roughly half its current size however the right one is reopened and the left is expanded. 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 05/10/15 large bilateral venous insufficiency wounds. There is less adherence surface left however the surface culture that I did last week grew pseudomonas therefore bilateral selective debridement score necessary. There is surrounding erythema. The patient describes severe bilateral drainage and a lot of pain in the left ankle. Apparently her podiatrist was were ready to do a cortisone shot 05/17/15; the patient complains  of pain and again copious amounts of drainage. 05/24/15; we used Iodo flex last week. Patient notes considerable improvement in wound drainage. Only needed to change this once. 05/31/15; we continued Iodoflex; the base of these large wounds bilaterally is not too bad but there is probably likely a significant bioburden here. I would like to debridement just doesn't tolerate it. 06/06/14 I would like to continue the Iodoflex although she still hasn't managed to obtain supplies. She has bilateral medial malleoli or large wounds which are mostly superficial. Both of them are covered circumferentially with some nonviable fibrinous slough although she  tolerates debridement very poorly. She apparently has an appointment for an ablation on the right leg by interventional radiology. 06/14/15; the patient arrives with the wounds and static condition. We attempted a debridement although she does not do well with this secondary to pain. I 07/05/15; wounds are not much smaller however there appears to be a cleaner granulating base. The left has tight fibrinous slough greater than the right. Debridement is tolerated poorly due to pain. Iodoflex is done more for these wounds in any of the multitude of different dressings I have tried on the left 1 and then subsequently the right. 07/12/15; no change in the condition of this wound. I am able to do an aggressive debridement on the right but not the left. She simply cannot tolerate it. We have been using Iodoflex which helps somewhat. It is worthwhile remembering that at one point we healed the right medial ankle wound and the left was about 25% of the current circumference. We have suggested returning to vascular surgery for review of possible further ablations for one reason or another she has not been able to do this. 07/26/15 no major change in the condition of either wound on her medial ankle. I did not attempt to debridement of these. She has been aggressively scrubbing these while she is in the shower at home. She has her supply of Iodoflex which seems to have done more for these wounds then anything I have put on recently. 08/09/15 wound area appears larger although not verified by measurements. Using Iodoflex 09/05/2015 -- she was here for avisit today but had significant problems with the wound and I was asked to see her for a physician opinion. I have summarize that this lady has had surgery on her left lower extremity about 10 years ago where the possible veins stripping was done. She has had an opinion from interventional radiology around November 2016 where no further sclerotherapy was ordered. The  patient works 12 hours a day and stands on a concrete floor with work boots and is unable to get the proper compression she requires and cannot elevate her limbs appropriately at any given time. She has recently grown Pseudomonas from her wound culture but has not started her ciprofloxacin which was called in for her. 09/13/15 this continues to be a difficult situation for this patient. At one point I had this wound down to a 1.5 x 1.5" wound on her left leg. This is deteriorated and the right leg has reopened. She now has substantial wounds on her medial calcaneus, malleoli and into her lower leg. One on the left has surface eschar but these are far too painful for me to debridement here. She has a vascular surgery appointment next week to see if anything can be done to help here. I think she has had previous ablations several years ago at [facility]. She has no major edema. She tells  me that she did not get product last time [facility]] and went for several days without it. She continues to work in work boots 12 hours a day. She cannot get compression/4-layer under her work boots. 09/20/15 no major change. Periwound edema control was not very good. Her point with pain and vascular is next Wednesday the 25th 09/28/15; the patient is seen vascular surgery and is apparently scheduled for repeat duplex ultrasounds of her bilateral lower legs next week. 10/05/15; the patient was seen by Dr. Doren Custard of vascular surgery. He feels that she should have arterial insufficiency excluded as cause/contributed to her nonhealing stage she is therefore booked for an arteriogram. She has apparently monophasic signals in the dorsalis pedis pulses. She also of course has known severe chronic venous insufficiency with previous procedures as noted previously. I had another long discussion with the patient today about her continuing to work 12 hour shifts. I've written her out for 2 months area had concerns about this as her  work location is currently undergoing significant turmoil and this may lead to her termination. She is aware of this however I agree with her that she simply cannot continue to stand for 12 hours multiple days a week with the substantial wound areas she has. 10/19/15; the Dr. Doren Custard appointment was largely for an arteriogram which was normal. She does not have an arterial issue. He didn't make a comment about her chronic venous insufficiency for which she has had previous ablations. Presumably it was not felt that anything additional could be done. The patient is now out of work as I prescribed 2 weeks ago. Her wounds look somewhat less aggravated presumably because of this. I felt I would give debridement another try today 10/25/15; no major change in this patient's wounds. We are struggling to get her product that she can afford into her own home through her insurance. 11/01/15; no major change in the patient's wounds. I have been using silver alginate as the most affordable product. I spoke to Dr. Marla Roe last week with her requested take her to the OR for surgical debridement and placement of ACEL. Dr. Marla Roe told me that she would be willing to do this however [facility] will not cover this, fortunately the patient has [place] healthcare of some variant 11/08/15; no major change in the patient's wounds. She has been completely nonviable surface that this but is in too much pain with any attempted debridement are clinic. I have arranged for her to see Dr. Marla Roe ham of plastic surgery and this appointment is on Monday. I am hopeful that they will take her to the OR for debridement, possible ACEL ultimately possible skin graft 11/22/15 no major change in the patient's wounds over her bilateral medial calcaneus medial malleolus into the lower legs. Surface on these does not look too bad however on the left there is surrounding erythema and tenderness. This may be cellulitis or could him  sleepy tinea. 11/29/15; no major changes in the patient's wounds over her bilateral medial malleolus. There is no infection here and I don't think any additional antibiotics are necessary. There is now plan to move forward. She sees Dr. Marla Roe in a week's time for preparation for operative debridement and ACEL placement I believe on 7/12. She then has a follow-up appointment with Dr. Marla Roe on 7/21 12/28/15; the patient returns today having been taken to the [place] by Dr. Marla Roe 12/12/15 she underwent debridement, intraoperative cultures [which were negative]. She had placement of a  wound VAC. Parent really ACEL was not available to be placed. The wound VAC foam apparently adhered to the wound since then she's been using silver alginate, Xeroform under Ace wraps. She still says there is a lot of drainage and a lot of pain 01/31/16; this is a patient I see monthly. I had referred her to Dr. Marla Roe him of plastic surgery for large wounds on her bilateral medial ankles. She has been to the OR twice once in early July and once in early August. She tells me over the last 3 weeks she has been using the wound VAC with ACEL underneath it. On the right we've simply been using silver alginate. Under Kerlix Coban wraps. 02/28/16; this is a patient I'm currently seeing monthly. She is gone on to have a skin graft over her large venous insufficiency ulcer on the left medial ankle. This was done by Dr. Marla Roe him. The patient is a bit perturbed about why she didn't have one on her right medial ankle wound. She has been using silver alginate to this. 03/06/16; I received a phone call from her plastic surgery Dr. Marla Roe. She expressed some concern about the viability of the skin graft she did on the left medial ankle wound. Asked me to place Endoform on this. She told me she is not planning to do a subsequent skin graft on the right as the left one did not take very well. I had placed Hydrofera Blue on  the right 03/13/16; continue to have a reasonably healthy wound on the right medial ankle. Down to 3 mm in terms of size. There is epithelialization here. The area on the left medial ankle is her skin graft site. I suppose the last week this looks somewhat better. She has an open area inferiorly however in the center there appears to be some viable tissue. There is a lot of surface callus and eschar that will eventually need to come off however none of this looked to be infected. Patient states that the is able to keep the dressing on for several days which is an improvement. 03/20/16 no major change in the circumference of either wound however on the left side the patient was at Dr. Eusebio Friendly office and they did a debridement of left wound. 50% of the wound seems to be epithelialized. I been using Endoform on the left Hydrofera Blue in the right 03/27/16; she arrives today with her wound is not looking as healthy as they did last week. The area on the right clearly has an adherent surface to this a very similar surface on the left. Unfortunately for this patient this is all too familiar problem. Clearly the Endoform is not working and will need to change that today that has some potential to help this surface. She does not tolerate debridement in this clinic very well. She is changing the dressing wants 04/03/16; patient arrives with the wounds looking somewhat better especially on the right. Dr. Migdalia Dk change the dressing to silver alginate when she saw her on Monday and also sold her some compression socks. The usefulness of the latter is really not clear and woman with severely draining wounds. 04/10/16; the patient is doing a bit of an experiment wearing the compression stockings that Dr. Migdalia Dk provided her to her left leg and the out of legs based dressings that we provided to the right. 05/01/16; the patient is continuing to wear compression stockings Dr. Migdalia Dk provided her on the left that  are apparently silver impregnated. She  has been using Iodoflex to the right leg wound. Still a moderate amount of drainage, when she leaves here the wraps only last for 4 days. She has to change the stocking on the left leg every night 05/15/16; she is now using compression stockings bilaterally provided by Dr. Marla Roe. She is wearing a nonadherent layer over the wounds so really I don't think there is anything specific being done to this now. She has some reduction on the left wound. The right is stable. I think all healing here is being done without a specific dressing 06/09/16; patient arrives here today with not much change in the wound certainly in diameter to large circular wounds over the medial aspect of her ankle bilaterally. Under the light of these services are certainly not viable for healing. There is no evidence of surrounding infection. She is wearing compression stockings with some sort of silver impregnation as prescribed by Dr. Marla Roe. She has a follow-up with her tomorrow. 06/30/16; no major change in the size or condition of her wounds. These are still probably covered with a nonviable surface. She is using only her purchase stockings. She did see Dr. Marla Roe who seemed to want to apply Dakin's solution to this I'm not extreme short what value this would be. I would suggest Iodoflex which she still has at home. 07/28/16; I follow Mrs. Bellizzi episodically along with Dr. Marla Roe. 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 She has been applying a topical collagen ointment to both wounds with Adaptic. I don't think Dr. Marla Roe is planning to take her back to the OR. 08/19/16; I follow Mrs. Jeneen Rinks on a monthly basis along with Dr. Marla Roe of plastic surgery. 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 I been following her for a number of years.  At one point I was able to get the right medial malleolus wound to heal and had the left medial malleolus down to about half its current size however and I had to send her to plastic surgery for an operative debridement. Since then things have been stable to slightly improve the area on the right is slightly better one in the left about the same although there is much less adherent surface than I'm used to with this patient. She is using some form of liquid collagen gel that Dr. Marla Roe provided a Kerlix cover with the patient's own pressure stockings. She tells me that she has extreme pain in both ankles and along the lateral aspect of both feet. She has been unable to work for some period of time. She is telling me she is retiring at the beginning of April. She sees Dr. Doran Durand of orthopedics next week 09/22/16; patient has not seen Dr. Marla Roe since the last time she is here. I'm not really sure what she is using to the wounds other than bits and pieces of think she had left over including most recently Hydrofera Blue. She is using juxtalite stockings. She is having difficulty with her husband's recent illness "stroke". She is having to transport him to various doctors appointments. Dr. Marla Roe left her the option of a repeat debridement with ACEL however she has not been able to get the time to follow-up on this. She continues to have a fair amount of drainage out of these wounds with certainly precludes leaving dressings on all week 10/13/16; patient has not seen Dr. Marla Roe since she was last  in our clinic. I'm not really sure what she is doing with the wounds, we did try to get her [facility] and I think she is actually using this most of the time. Because of drainage she states she has to change this every second day although this is an improvement from what she used to do. She went to see Dr. Doran Durand who did not think she had a muscular issue with regards to her feet, he referred  her to a neurologist and I think the appointment is sometime in June. I changed her back to Iodoflex which she has used in the past but not recently. 11/03/16; the patient has been using Iodoflex although she ran out of this. Still claims that there is a lot of drainage although the wound does not look like this. No surrounding erythema. She has not been back to see Dr. Marla Roe 11/24/16; the patient has been using Iodoflex again but she ran out of it 2 or 3 days ago. There is no major change in the condition of either one of these wounds in fact they are larger and covered in a thick adherent surface slough/nonviable tissue especially on the left. She does not tolerate mechanical debridement in our clinic. Going back to see Dr. Marla Roe of plastic surgery for an operative debridement would seem reasonable. 12/15/16; the patient has not been back to see Dr. Marla Roe. She is been dealing with a series of illnesses and her husband which of monopolized her time. She is been using Sorbact which we largely supplied. She states the drainage is bad enough that it maximum she can go 2-3 days without changing the dressing 01/12/2017 -- the patient has not been back for about 4 weeks and has not seen Dr. Marla Roe not does she have any appointment pending. 01/23/17; patient has not seen Dr. Marla Roe even though I suggested this previously. She is using Santyl that was suggested last week by Dr. Con Memos this Cost her $16 through her insurance which is indeed surprising 02/12/17; continuing Santyl and the patient is changing this daily. A lot of drainage. She has not been back to see plastic surgery she is using an Ace wrap. Our intake nurse suggested wrap around stockings which would make a good reasonable alternative 02/26/17; patient is been using Santyl and changing this daily due to drainage. She has not been to see plastic surgery she uses in April Ace wrap to control the edema. She did obtain extremitease  stockings but stated that the edema in her leg was to big for these 03/20/17; patient is using Santyl and Anasept. Surfaces looked better today the area on the right is actually measuring a little smaller. She has states she has a lot of pain in her feet and ankles and is asking for a consult to pain control which I'll try to help her with through our case manager. 04/10/17; the patient arrives with better-looking wound surfaces and is slightly smaller wound on the left she is using a combination of Santyl and Anasept. She has an appointment or at least as started in the pain control center associated with [facility] 05/14/17; this is a patient who I followed for a prolonged period of time. 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 At one point I had this down to a much smaller wound on the left however these reopened and we've never been able to get these to heal. She has been using Santyl and Anasept gel although 2 weeks  ago she ran out of the Anasept gel. She has a stable appearance of the wound. She is going to the wound care clinic at [facility]. They wanted do a nerve block/spinal block although she tells me she is reluctant to go forward with that. 05/21/17; this is a patient I have followed for many years. 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 Chronic pain and deformity in her ankles as well. She is been to see plastic surgery as well as orthopedics. Using PolyMem AG most recently/Kerramax/ABDs and 2 layer compression. She has managed to keep this on and she is coming in for a nurse check to change the dressing on Tuesdays, we see her on Fridays 06/05/17; really quite a good looking surface and the area especially on the right medial has contracted in terms of dimensions. Well granulated healthy-looking tissue on both sides. Even with an open curet there is nothing that even feels abnormal here. This is as good as I've seen this  in quite some time. We have been using PolyMem AG and bringing her in for a nurse check 06/12/17; really quite good surface on both of these wounds. The right medial has contracted a bit left is not. We've been using PolyMem and AG and she is coming in for a nurse visit 06/19/17; we have been using PolyMem AG and bringing her in for a nurse check. Dimensions of her wounds are not better but the surfaces looked better bilaterally. She complained of bleeding last night and the left wound and increasing pain bilaterally. She states her wound pain is more neuropathic than just the wounds. There was some suggestion that this was radicular from her pain management doctor in talking to her it is really difficult to sort this out. 06/26/17; using PolyMem and AG and bringing her in for a nurse check as All of this and reasonably stable condition. Certainly not improved. The dimensions on the lateral part of the right leg look better but not really measuring better. The medial aspect on the left is about the same. 07/03/16; we have been using PolyMen AG and bringing her in for a nurse check to change the dressings as the wounds have drainage which precludes once weekly changing. We are using all secondary absorptive dressings.our intake nurse is brought up the idea of using a wound VAC/snap VAC on the wound to help with the drainage to see if this would result in some contraction. This is not a bad idea. The area on the right medial is actually looking smaller. Both wounds have a reasonable-looking surface. There is no evidence of cellulitis. The edema is well controlled 07/10/17; the patient was denied for a snap VAC by her insurance. The major issue with these wounds continues to be drainage. We are using wicked PolyMem AG and she is coming in for a nurse visit to change this. The wounds are stable to slightly improved. The surface looks vibrant and the area on the right certainly has shrunk in size but very  slowly 07/17/17; the patient still has large wounds on her bilateral medial malleoli. Surface of both of these wounds looks better. The dimensions seem to come and go but no consistent improvement. There is no epithelialization. We do not have options for advanced treatment products due to insurance issues. They did not approve of the wound VAC to help control the drainage. More recently we've been using PolyMem and AG wicked to allow drainage through. We have been bringing her  in for a nurse visit to change this. We do not have a lot of options for wound care products and the home again due to insurance issues 07/24/17; the patient's wound actually looks somewhat better today. No drainage measurements are smaller still healthy-looking surface. We used silver collagen under PolyMen started last week. We have been bringing her in for a dressing change 07/31/17; patient's wound surface continued to look better and I think there is visible change in the dimensions of the wound on the right. Rims of epithelialization. We have been using silver collagen under PolyMen and bringing her in for a dressing change. There appears to be less drainage although she is still in need of the dressing change 08/07/17. Patient's wound surface continues to look better on both sides and the area on the right is definitely smaller. We have been using silver collagen and PolyMen. She feels that the drainage has been it has been better. I asked her about her vascular status. She went to see Dr. Aleda Grana at [facility] and had some form of ablation. I don't have much detail on this. I haven't my notes from 2016 that she was not a candidate for any further ablation but I don't have any more information on this. We had referred her to vein and vascular I don't think she ever went. He does not have a history of PAD although I don't have any information on this either. We don't even have ABIs in our record 08/14/17; we've been  using silver collagen and PolyMen cover. And putting the patient and compression. She we are bringing her in as a nurse visit to change this because ofarge amount of drainage. We didn't the ABIs in clinic today since they had been done in many moons 1.2 bilaterally. She has been to see vein and vascular however this was at [facility] and she had ablation although I really don't have any information on this all seemed biking get a report. She is also been operatively debrided by plastic surgery and had a cell placed probably 8-12 months ago. This didn't have a major effect. We've been making some gains with current dressings 08/19/17-She is here in follow-up evaluation for bilateral medial malleoli ulcers. She continues to tolerate debridement very poorly. We will continue with recently changed topical treatment; if no significant improvement may consider switching to Iodosorb/Iodoflex. 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 She has been using silver collagen and PolyMem. I believe she has been used and tried on Iodoflex before. 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 These have been very refractory over time. We have been using silver collagen and PolyMen. She is coming in once a week for a doctor's and once a week for nurse visits. We are actually making some progress 09/18/17; the patient's wounds are smaller especially on the right medial. She arrives today to upset to consider even washing these off with Anasept which I think is been part of the reason this is been closing. We've been using collagen covered in PolyMen otherwise. It is noted that she has a small area of folliculitis on the right medial calf that. As we are wrapping her legs I'll give her  a short course of doxycycline to make sure this doesn't amount to anything.  She is a long list of complaints today including imbalance, shortness of breath on exertion, inversion of her left ankle. With regards to the latter complaints she is been to see orthopedics and they offered her a tendon release surgery I believe but wanted her wounds to be closed first. I have recommended she go see her primary physician with regards to everything else. 09/25/17; patient's wounds are about the same size. We have made some progress bilaterally although not in recent weeks. She will not allow me T wash these wounds with Anasept even if she is doing her cell. Wheeze we've been using collagen covered in PolyMen. Last week she had a small area of folliculitis this is now opened into a small wound. She completed 5 days of trimethoprim sulfamethoxazole 10/02/17; unfortunately the area on her left medial ankle is worse with a larger wound area towards the Achilles. The patient complains of a lot of pain. She will not allow debridement although visually I don't think there is anything to debridement in any case. We have been using silver collagen and PolyMen for several months now. Initially we are making some progress although I'm not really seeing that today. We will move back to [facility]. His admittedly this is a bit of a repeat however I'm hoping that his situation is different now. The patient tells me she had her leg on the left give out on her yesterday this is process some pain. 10/09/17; the patient is seen twice a week largely because of drainage issues coming out of the chronic medial bimalleolar wounds that are chronic. Last week the dimensions of the one on the left looks a little larger I changed her to [facility]. She comes in today with a history of terrible pain in the bilateral wound areas. She will not allow debridement. She will not even allow a tissue culture. There is no surrounding erythema  no no evidence of cellulitis. We have been putting her Kerlix Coban man. She will not allow more aggressive compression as there was a suggestion to put her in 3 layer wraps. 10/16/17; large wounds on her bilateral medial malleoli. These are chronic. Not much change from last week. The surface looks have healthy but absolutely no epithelialization. A lot of pain little less so of drainage. She will not allow debridement or even washing these off in the vigorous fashion with Anasept. 10/23/17; large wounds on her bilateral malleoli which are chronic. Some improvement in terms of size perhaps on the right since last time I saw these. She states that after we increased the 3 layer compression there was some bleeding, when she came in for a nurse visit she did not want 3 layer compression put back on about our nurse managed to convince her. She has known chronic venous visit issues and I'm hoping to get her to tolerate the 3 layer compression. using Hydrofera Blue 10/30/17; absolutely no change in the condition of either wound although we've had some improvement in dimensions on the right.. Attempted to put her in 3 layer compression she didn't tolerated she is back in 2 layer compression. We've been using Hydrofera Blue We looked over her past records. She had venous reflux studies in November 2016. There was no evidence of deep venous reflux on the right. Superficial vein did not show the greater saphenous vein at think this is been previously ablated the small saphenous vein was within normal limits. The left deep venous system showed no DVT the vessels were  positive for deep venous reflux in the posterior tibial veins at the ankle. The greater saphenous vein was surgically absent small saphenous vein was within normal limits. She went to vein and vascular at [facility]. I believe she had an ablation on the left greater saphenous vein. I'll update her reflux studies perhaps ever reviewed by vein and  vascular. We've made absolutely no progress in these wounds. Will also try to read and TheraSkins through her insurance 11/06/17; W the patient apparently has a 2 week follow-up with vein and vascular I like him to review the whole issue with regards to her previous vascular workup by Dr. Aleda Grana. We've really made no progress on these wounds in many months. She arrives today with less viable looking surface on the left medial ankle wound. This was apparently looking about the same on Tuesday when she was here for nurse visit. 11/13/17; deep tissue culture I did last time of the left lower leg showed multiple organisms without any predominating. In particular no Staphylococcus or group A strep were isolated. We sent her for venous reflux studies. She's had a previous left greater saphenous vein stripping and I think sclerotherapy of the right greater saphenous vein. She didn't really look at the lesser saphenous vein this both wounds are on the medial aspect. She has reflux in the common femoral vein and popliteal vein and an accessory vein on the right and the common femoral vein and popliteal vein on the left. I'm going to have her go to see vein and vascular just the look over things and see if anything else beside aggressive compression is indicated here. We have not been able to make any progress on these wounds in spite of the fact that the surface of the wounds is never look too bad. 11/20/17; no major change in the condition of the wounds. Patient reports a large amount of drainage. She has a lot of complaints of pain although enlisting her today I wonder if some of this at least his neuropathic rather than secondary to her wounds. She has an appointment with vein and vascular on 12/30/17. The refractory nature of these wounds in my mind at least need vein and vascular to look over the wounds the recent reflux studies we did and her history to see if anything further can be done here. I also  note her gait is deteriorated quite a bit. 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 She has a bilateral Trendelenburg gait. I wonder if this is neuropathic or perhaps multilevel radicular. 11/27/17; her wounds actually looks slightly better. Healthy-looking granulation tissue a scant amount of epithelialization. [place] healthcare will not pay for [employer]. They will play for tri layer Oasis and Dermagraft. This is not a diabetic ulcer. We'll try for the tri layer Oasis. She still complains of some drainage. She has a vein and vascular appointment on 12/30/17 12/04/17; the wounds visually look quite good. Healthy-looking granulation with some degree of epithelialization. We are still waiting for response to our request for trial to try layer Oasis. Her appointment with vascular to review venous and arterial issues isn't sold the end of July 7/31. Not allow debridement or even vigorous cleansing of the wound surface. 12/18/17; slightly smaller especially on the right. 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 We've been using Hydrofera Blue. We still are looking into triple layer Oasis through her insurance 01/08/18 on evaluation today patient's wound actually appears to be showing signs of good  improvement at this point in time. She has been tolerating the dressing changes without complication. Fortunately there does not appear to be any evidence of infection at this point in time. We have been utilizing silver nitrate which does seem to be of benefit for her which is also good news. Overall I'm very happy with how things seem to be both regards appearance as well as measurement. Patient did see Dr. Bridgett Larsson for evaluation on 12/30/17. In his assessment he felt that stripping would not likely add much more than chronic compression to the patient's healing process. His recommendation was to follow-up in three months with Dr. Doren Custard if she hasn't healed in order to consider  referral back to you and see vascular where she previously was in a trial and was able to get her wound to heal. I'll be see what she feels she when you staying compression and he reiterated this as well. 01/13/18 on evaluation today patient appears to actually be doing very well in regard to her bilateral medial malleolus ulcers. She seems to have tolerated the chemical cauterization with silver nitrate last week she did have some pain through that evening but fortunately states that I'll be see since it seems to be doing better she is overall pleased with the progress. 01/21/18; really quite a remarkable improvement since I've last seen these wounds. We started using silver nitrate specially on the islands of hyper granulation which for some reason her around the wound circumference. This is really done quite nicely. Primary dressing Hydrofera Blue under 4 layer compression. She seems to be able to hold out without a nurse rewrap. Follow-up in 1 week 01/28/18; we've continued the hydrofera blue but continued with chemical cauterization to the wound area that we started about a month ago for irregular hyper granulation. She is made almost stunning improvement in the overall wound dimensions. I was not really expecting this degree of improvement in these chronic wounds 02/05/18; we continue with Hydrofera Bluebut of also continued the aggressive chemical cauterization with silver nitrate. We made nice progress with the right greater than left wound. 02/12/18. We continued with Hydrofera Blue after aggressive chemical cauterization with silver nitrate. We appear to be making nice progress with both wound areas 02/19/2018; we continue with [facility] after washing the wounds vigorously with Anasept spray and chemical cauterization with silver nitrate. We are making excellent progress. The area on the right's just about closed 02/26/2018. The area on the left medial ankle had too much necrotic debris  today. I used a #5 curette we are able to get most of the soft. I continued with the silver nitrate to the much smaller wound on the right medial ankle she had a new area on her right lower pretibial area which she says was due to a role in her compression 03/05/2018; both wound areas look healthy. Not much change in dimensions from last week. I continue to use silver nitrate and Hydrofera Blue. The patient saw Dr. Doren Custard of vein and vascular. He felt she had venous stasis ulcers. He felt based on her previous arteriogram she should have adequate circulation for healing. Also she has deep venous reflux but really no significant correctable superficial venous reflux at this time. He felt we should continue with conservative management including leg elevation and compression 04/02/2018; since we last saw this woman about a month ago she had a fall apparently suffered a pelvic fracture. I did not look up the x-ray. Nevertheless because of pain she literally  was bedbound for 2 weeks and had home health coming out to change the dressing. Somewhat predictably this is resulted in considerable improvement in both wound areas. The right is just about closed on the medial malleolus and the left is about half the size. 04/16/2018; both her wounds continue to go down in size. Using Hydrofera Blue. 05/07/18; both her wounds appeared to be improving especially on the right where it is almost closed. We are using Hydrofera Blue 05/14/2018; slightly worse this week with larger wounds. Surface on the left medial not quite as good. We have been using Hydrofera Blue 05/21/18; again the wounds are slightly larger. Left medial malleolus slightly larger with eschar around the circumference. We have been using Hydrofera Blue undergoing a wraps for a prolonged period of time. This got a lot better when she was more recumbent due to a fall and a back injury. I change the primary dressing the silver alginate today. She did not  tolerate a 4 layer compression previously although I may need to bring this up with her next time 05/28/2018; area on the left medial malleolus again is slightly larger with more drainage. Area on the right is roughly unchanged. She has a small area of folliculitis on the right medial just on the lower calf. This does not look ominous. 06/03/2018 left medial malleolus slightly smaller in a better looking surface. We used silver nitrate on this last time with silver alginate. The area on the right appears slightly smaller 1/10; left medial malleolus slightly smaller. Small open area on the right. We used silver nitrate and silver alginate as of 2 weeks ago. We continue with the wound and compression. These got a lot better when she was off her feet 1/17; right medial malleolus wound is smaller. The left may be slightly smaller. Both surfaces look somewhat better. 1/24; both wounds are slightly smaller. Using silver alginate under Unna boots 1/31; both wounds appear smaller in fact the area on the right medial is just about closed. Surface eschar. We have been using silver alginate under Unna boots. The patient is less active now spends let much less time on her feet and I think this is contributed to the general improvement in the wound condition 2/7; both wounds appear smaller. I was hopeful the right medial would be closed however there there is still the same small open area. Slight amount of surface eschar on the left the dimensions are smaller there is eschar but the wound edges appear to be free. We have been using silver alginate under Unna boot's 2/14; both wounds once again measure smaller. Circumferential eschar on the left medial. We have been using silver alginate under Unna boots with gradual improvement 2/21; the area on the right medial malleolus has healed. The area on the left is smaller. We have been using silver alginate and Unna boots. We can discharge wrapping the right leg she has  20/30 stockings at home she will need to protect the scar tissue in this area 2/28; the area on the right medial malleolus remains closed the patient has a compression stocking. The area on the left is smaller. We have been using silver alginate and Unna boots. 3/6 the area on the right medial ankle remains closed. Good edema control noted she is using her own compression stocking. The area on the left medial ankle is smaller. We have been managing this with silver alginate and Unna boots which we will continue today. 3/13; the area on the right  medial ankle remains closed and I'm declaring it healed today. When necessary the left is about the same still a healthy-looking surface but no major change and wound area. No evidence of infection and using silver alginate under unna and generally making considerable improvement 3/27 the area on the right medial ankle remains closed the area on the left is about the same as last week. Certainly not any worse we have been using silver alginate under an Unna boot 4/3; the area on the right medial ankle remains closed per the patient. We did not look at this wound. The wound on the left medial ankle is about the same surface looks healthy we have been using silver alginate under an Unna boot 4/10; area on the right medial ankle remains closed per the patient. We did not look at this wound. The wound on the left medial ankle is slightly larger. The patient complains that the [facility] caused burning pain all week. She also told us that she was a lot more active this week. Changed her back to silver alginate 4/17; right medial ankle still closed per the patient. Left medial ankle is slightly larger. Using silver alginate. She did not tolerate Hydrofera Blue on this area 4/24; right medial ankle remains closed we have not look at this. The left medial ankle continues to get larger today by about a centimeter. We have been using silver alginate under Unna  boots. She complains about 4 layer compression as an alternative. She has been up on her feet working on her garden 5/8; right medial ankle remains closed we did not look at this. The left medial ankle has increased in size about 100%. We have been using silver alginate under Unna boots. She noted increased pain this week and was not surprised that the wound is deteriorated 5/15; no major change in SA however much less erythema ( one week of doxy ocellulitis). 5/22-64 year old female returns at 1 week to the clinic for left medial ankle wound for which we have been using silver alginate under 3 layer compression She was placed on DOXY at last visit - the wound is wider at this visit. She is in 3 layer compression 5/29; change to [facility] last week. I had given her empiric doxycycline 2 weeks ago for a week. She is in 3 layer compression. She complains of a lot of pain and drainage on presentation today. 6/5; using Hydrofera Blue. I gave her doxycycline recently empirically for erythema and pain around the wound. Believe her cultures showed enterococcus which not would not have been well covered by doxycycline nevertheless the wound looks better and I don't feel specifically that the enterococcus needs to be covered. She has a new what looks like a wrap injury on her lateral left ankle. 6/12; she is using Hydrofera Blue. She has a new area on the left anterior lower tibial area. This was a wrap injury last week. 6/19; the patient is using Hydrofera Blue. She arrived with marked inflammation and erythema around the wound and tenderness. 12/01/18 on evaluation today patient appears to be doing a little bit better based on what I'm hearing from the standpoint of lassos evaluation to this as far as the overall appearance of the wound is concerned. Then sometime substandard she typically sees Dr. Dellia Nims. Nonetheless overall very pleased with the progress that she's made up to this point. No fevers,  chills, nausea, or vomiting noted at this time. 7/10; some improvement in the surface area. Aggressively debrided  last week apparently. I went ahead with the debridement today although the patient does not tolerate this very well. We have been using Iodoflex. Still a fair amount of drainage 7/17; slightly smaller. Using Iodoflex. 7/24; no change from last week in terms of surface area. We have been using Iodoflex. Surface looks and continues to look somewhat better 7/31; surface area slightly smaller better looking surface. We have been using Iodoflex. This is under Unna boot compression 8/7-Patient presents at 1 week with Unna boot and Iodoflex, wound appears better 8/14-Patient presents at 1 week with Iodoflex, we use the Unna boot, wound appears to be stable better.Patient is getting Botox treatment for the inversion of the foot for tendon release, Next week 8/21; we are using Iodoflex. Unna boot. The wound is stable in terms of surface area. Under illumination there is some areas of the wound that appear to be either epithelialized or perhaps this is adherent slough at this point I was not really clear. It did not wipe off and I was reluctant to debride this today. 8/28; we are using Iodoflex in an Unna boot. Seems to be making good improvement. 9/4; using Iodoflex and wound is slightly smaller. 9/18; we are using Iodoflex with topical silver nitrate when she is here. The wound continues to be smaller 10/2; patient missed her appointment last week due to GI issues. She left and Iodoflex based dressing on for 2 weeks. Wound is about the same size about the size of a dime on the left medial lower 10/9 we have been using Iodoflex on the medial left ankle wound. She has a new superficial probable wrap injury on the dorsal left ankle 10/16; we have been using Hydrofera Blue since last week. This is on the left medial ankle 10/23; we have been using Hydrofera Blue since 2 weeks ago. This is on the  left medial ankle. Dimensions are better 11/6; using Hydrofera Blue. I think the wound is smaller but still not closed. Left medial ankle 11/13; we have been using Hydrofera Blue. Wound is certainly no smaller this week. Also the surface not as good. This is the remanent of a very large area on her left medial ankle. 11/20; using Sorbact since last week. Wound was about the same in terms of size although I was disappointed about the surface debris 12/11; 3-week follow-up. Patient was on vacation. Wound is measuring slightly larger we have been using Sorbact. 12/18; wound is about the same size however surface looks better last week after debridement. We have been using Sorbact under compression 1/15 wound is probably twice the size of last time increased in length nonviable surface. We have been using Sorbact. She was running a mild fever and missed her appointment last week 1/22; the wound is come down in size but under illumination still a very adherent debris we have been Hydrofera Blue that I changed her to last week 1/29; dimensions down slightly. We have been using Hydrofera Blue 2/19 dimensions are the same however there is rims of epithelialization under illumination. Therefore more the surface area may be epithelialized 2/26; the patient's wound actually measures smaller. The wound looks healthy. We have been using Hydrofera Blue. I had some thoughts about running Apligraf then I still may do that however this looks so much better this week we will delay that for now 3/5; the wound is small but about the same as last week. We have been using Hydrofera Blue. No debridement is required today. 3/19; the wound  is about the size of a dime. Healthy looking wound even under illumination. We have been using Hydrofera Blue. No mechanical debridement is necessary 3/26; not much change from last week although still looks very healthy. We have been using Hydrofera Blue under Unna boots Patient was  offered an ankle fusion by podiatry but not until the wound heals with a proceed with this. 4/9; the patient comes in today with her original wound on the medial ankle looking satisfactory however she has some uncontrolled swelling in the middle part of her leg with 2 new open areas superiorly just lateral to the tibia. I think this was probably a wrap issue. She said she felt uncomfortable during the week but did not call in. We have been using Hydrofera Blue 4/16; the wound on the medial ankle is about the same. She has innumerable small areas superior to this across her mid tibia. I think this is probably folliculitis. She is also been working in the yard doing a lot of sweating 4/30; the patient issue on the upper areas across her mid tibia of all healed. I think this was excessive yard work if I remember. Her wound on the medial ankle is smaller. Some debris on this we have been using Hydrofera Blue under Unna boots 5/7; mid tibia. She has been using Hydrofera Blue under an Unna wrap. She is apparently going for her ankle surgery on June 3 10/28/19-Patient returns to clinic with the ankle wound, we are using Hydrofera Blue under Unna wrap, surgery is scheduled for her left foot for June 3 so she will be back for nurse visit next week READMISSION 01/17/2020 Mrs. Esqueda is a 64 year old woman we have had in this clinic for a long period of time with severe venous hypertension and refractory wounds on her medial lower legs and ankles bilaterally. 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 When she was off her legs for a prolonged period example when she fell and suffered a compression fracture things would heal up quite nicely. She is now retired and we managed to heal up the right medial leg wound. The left one was very tiny last time I saw this although still refractory. 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 It got to the point where this was interfering with her walking and she elected to proceed with a ankle arthrodesis to straighten her her ankle and leave her with a functional outcome for mobilization. The patient was referred to Dr. Doren Custard and really this took some time to arrange. Dr. Doren Custard saw her on 12/07/2019. Once again he verified that she had no arterial issues. She had previously had an angiogram several years ago. Follow-up ABIs on the left showed an ABI of 1.12 with triphasic waveforms and a TBI of 0.92. She is felt to have chronic deep venous insufficiency but I do not think it was felt that anything could be done from about this from an ablation point of view. At the time Dr. Doren Custard saw this patient the wounds actually look closed via the pictures in his clinic. The patient finally underwent her surgery on 12/15/2019. This went reasonably well and there was a good anatomic outcome. She developed a small distal wound dehiscence on the lateral part of the surgical wound. However  more problematically she is developed recurrence of the wound on the medial left ankle. There are actually 2 wounds here one in the distal lower leg and 1 pretty much at the level of the medial malleolus. It is a more distal area that is more problematic. She has been using Hydrofera Blue which started on Friday before this she was simply Ace wrapping. There was a culture done that showed Pseudomonas and she is on ciprofloxacin. A recent CNS on 8/11 was negative. The patient reports some pain but I generally think this is improving. She is using a cam boot completely nonweightbearing using a walker for pivot transfers and a wheelchair 8/24; not much improvement unfortunately she has a surgical wound on the lateral part in the venous insufficiency wound medially. The bottom part of the medial  insufficiency wound is still necrotic there is exposed tendon here. We have been using Hydrofera Blue under compression. Her edema control is however better 8/31; patient in for follow-up of his surgical wound on the lateral part of her left leg and chronic venous insufficiency ulcers medially. We put her back in compression last week. She comes in today with a complaint of 3 or 4 days worth of increasing pain. She felt her cam walker was rubbing on the area on the back of her heel. However there is intense erythema seems more likely she has cellulitis. She had 2 cultures done when she was seeing podiatry in the postop. One of them in late July showed Pseudomonas and she received a course of ciprofloxacin the other was negative on 8/11 she is allergic to penicillin with anaphylactoid complaints of hives oral swelling via information in epic 9/9; when I saw this patient last week she had intense anterior erythema around her wound on the right lateral heel and ankle and also into the right medial heel. Some of this was no doubt drainage and her walker boot however I was convinced she had cellulitis. I gave her Levaquin and Bactrim she is finishing up on this now. She is following up with Dr. [name] he saw her yesterday. He is taken her out of the walking boot of course she is still nonweightbearing. Her x-ray was negative for any worrisome features such as soft tissue air etc. Things are a lot better this week. She has home health. We have been using Hydrofera Blue under an [employer] which she put back on yesterday. I did not wrap her last week 9/17; her surrounding skin looks a lot better. In fact the area on the left lateral ankle has just a scant amount of eschar. The only remaining wound is the large area on the left medial ankle. Probably about 60% of this is healthy granulation at the surface however she has a significant divot distally. This has adherent debris in it. I been using debridement and  silver collagen to try and get this area to fill-in although I do not think we have made much progress this week 9/24; the patient's wound on the left medial ankle looks a lot better. The deeper divot area distally still requires debridement but this is cleaning up quite nicely we have been using silver collagen. The patient is complaining of swelling in her foot and is worried that that is contributing to the nonhealing of the ankle wound. She is also complaining of numbness in her anterior toes 10/4; left medial ankle. The small area distally still has a divot with necrotic material that I have been debriding away.  This has an undermining area. She is approved for Apligraf. She saw Dr. [name] her surgeon on 10/1. I think he declared himself is satisfied with the condition of things. Still nonweightbearing till the end of the month. 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 Her surgical wound is well closed. There is no evidence of infection 10/11; the patient arrived in clinic today with the expectation that we be able to put an Apligraf on this area after debridement however she arrives with a relatively large amount of green drainage on the dressing. The patient states that this started on Friday. She has not been systemically unwell. 10/19; culture I did last week showed both Enterococcus and Pseudomonas. I think this came in separate parts because I stopped her ciprofloxacin I gave her and prescribed her linezolid however now looking at the final culture result this is Pseudomonas which is resistant to quinolones. She has not yet picked up the linezolid apparently phone issues. We are also trying to get a topical antibiotic out of [facility] in [place] they can be applied by home health. She is still having green drainage 10/16; the patient has her topical antibiotic from [facility] in [place]. This is a compounded gel with vancomycin and ciprofloxacin  and gentamicin. We are applying this on the wound bed with silver alginate over the top with Unna boot wraps. She arrives in clinic today with a lot less ominous looking drainage although she is only use this topical preparation once the second time today. She sees Dr. [name] her surgeon on Friday she has home health changing the dressing 11/2; still using her compounded topical antibiotic under silver alginate. Surface is cleaning up there is less drainage. We had an Apligraf for her today and I elected to apply it. A light coating of her antibiotic Objective Constitutional Sitting or standing Blood Pressure is within target range for patient.. Pulse regular and within target range for patient.Marland Kitchen Respirations regular, non-labored and within target range.. Temperature is normal and within the target range for the patient.Marland Kitchen Appears in no distress. Vitals Time Taken: 9:05 AM, Height: 68 in, Source: Stated, Source: Stated, Temperature: 98.1 F, Pulse: 64 bpm, Respiratory Rate: 18 breaths/min, Blood Pressure: 117/67 mmHg. General Notes: Wound exam; left medial ankle the most distal part of the wound has the greatest depth. Surface looks really healthy although there was some gelatinous subcutaneous debris debris that I removed from a #5 curette. The base of this then cleaned up quite nicely. Apligraf #1 applied Integumentary (Hair, Skin) Wound #15 status is Open. Original cause of wound was Gradually Appeared. The wound is located on the Left,Medial Malleolus. The wound measures 4.4cm length x 1.6cm width x 1.1cm depth; 5.529cm^2 area and 6.082cm^3 volume. There is tendon and Fat Layer (Subcutaneous Tissue) exposed. There is no tunneling noted, however, there is undermining starting at 4:00 and ending at 6:00 with a maximum distance of 1cm. There is a medium amount of serosanguineous drainage noted. The wound margin is distinct with the outline attached to the wound base. There is medium (34-66%) red,  pink granulation within the wound bed. There is a medium (34-66%) amount of necrotic tissue within the wound bed including Adherent Slough. Assessment Active Problems ICD-10 Chronic venous hypertension (idiopathic) with ulcer and inflammation of left lower extremity Non-pressure chronic ulcer of other part of left lower leg with other specified severity Non-pressure chronic ulcer of left ankle with other specified severity Procedures Wound #15 Pre-procedure diagnosis  of Wound #15 is a Venous Leg Ulcer located on the Left,Medial Malleolus . There was a Haematologist Compression Therapy Procedure by [name], RN. Post procedure Diagnosis Wound #15: Same as Pre-Procedure Plan Follow-up Appointments: Return Appointment in 2 weeks. Nurse Visit: - tuesday Dressing Change Frequency: Wound #15 Left,Medial Malleolus: Do not change entire dressing for one week. - nov 16 patient to be seen by home health, outter dressing change only , DO NOT remove adaptic or steri strips Skin Barriers/Peri-Wound Care: Moisturizing lotion - to leg Wound Cleansing: May shower with protection. - use cast protector Primary Wound Dressing: Wound #15 Left,Medial Malleolus: Skin Substitute Application - APLI Graft #1 , apply thin layer of keystone gel , mix as ordered Other: - saline moist gauze over adaptic/steri strips Secondary Dressing: Wound #15 Left,Medial Malleolus: ABD pad Edema Control: Unna Boot to Left Lower Extremity Avoid standing for long periods of time Elevate legs to the level of the heart or above for 30 minutes daily and/or when sitting, a frequency of: - throughout the day Off-Loading: Other: - ensure no pressure to wound areas. Home Health: [facility] skilled nursing for wound care. - [facility] to see on Thursday, then weekly Apligraf #1 ooShould be back next week for [place] to check the drainage. Hopefully the drainage will be tolerated ooABD Retail banker) Signed: 04/11/2020 9:36:19 AM By: Linton Ham MD Entered By: Linton Ham on 04/10/2020 10:00:09 -------------------------------------------------------------------------------- SuperBill Details Patient Name: Date of Service: [name], [name] 04/10/2020 Medical Record Number: [id number] Patient Account Number: [id number] Date of Birth/Sex: Treating RN: 06/21/1955 (64 y.o. [name] Primary Care Provider: [name] Other Clinician: Referring Provider: Treating Provider/Extender: [name] in Treatment: 12 Diagnosis Coding ICD-10 Codes Code Description [phone] Chronic venous hypertension (idiopathic) with ulcer and inflammation of left lower extremity L97.828 Non-pressure chronic ulcer of other part of left lower leg with other specified severity L97.328 Non-pressure chronic ulcer of left ankle with other specified severity Facility Procedures CPT4: Code [id number] C5 or Description: 664 Application of skin substitute graft to trunk, arms, legs, total wound surface area up to 100 sq cm; first 25 sqcm less wound surface area ICD-10 Diagnosis Description L97.328 Non-pressure chronic ulcer of left ankle with other specified  severity Modifier: Quantity: 1 Physician Procedures Electronic Signature(s) Signed: 04/11/2020 9:36:19 AM By: Linton Ham MD Entered By: Linton Ham on 04/10/2020 10:01:30

## 2020-04-17 ENCOUNTER — Other Ambulatory Visit: Payer: Self-pay

## 2020-04-17 ENCOUNTER — Encounter (HOSPITAL_BASED_OUTPATIENT_CLINIC_OR_DEPARTMENT_OTHER): Payer: Medicare Other | Admitting: Internal Medicine

## 2020-04-17 DIAGNOSIS — I87332 Chronic venous hypertension (idiopathic) with ulcer and inflammation of left lower extremity: Secondary | ICD-10-CM | POA: Diagnosis not present

## 2020-04-17 NOTE — Progress Notes (Signed)
ELVERA, ALMARIO ([id number]) Visit Report for 04/17/2020 SuperBill Details Patient Name: Date of Service: [employer] [facility] [employer] [name], [name] 04/17/2020 Medical Record Number: [id number] Patient Account Number: [id number] Date of Birth/Sex: Treating RN: 08-20-1955 (64 y.o. [name] Provider: [name] Other Clinician: Referring Provider: Treating Provider/Extender: [name] in Treatment: 13 Diagnosis Coding ICD-10 Codes Code Description [phone] Chronic venous hypertension (idiopathic) with ulcer and inflammation of left lower extremity L97.828 Non-pressure chronic ulcer of other part of left lower leg with other specified severity L97.328 Non-pressure chronic ulcer of left ankle with other specified severity Facility Procedures CPT4 Code Description Modifier Quantity [id number] (Facility Use Only) [id number] - Dorise Bullion BOOT LT 1 Electronic Signature(s) Signed: 04/17/2020 4:54:15 PM By: Linton Ham MD Signed: 04/17/2020 5:21:16 PM By: Baruch Gouty RN, BSN Entered By: Baruch Gouty on 04/17/2020 09:35:57

## 2020-04-17 NOTE — Progress Notes (Signed)
[name], [name] ([id number]) Visit Report for 04/17/2020 Arrival Information Details Patient Name: Date of Service: [facility] [name], [name] 04/17/2020 9:00 A M Medical Record Number: [id number] Patient Account Number: [id number] Date of Birth/Sex: Treating RN: [name] (64 y.o. [name] Primary Care [name]: [name] Other Clinician: Referring [name]: Treating [name]/Extender: [name] in Treatment: 13 Visit Information History Since Last Visit Added or deleted any medications: No Patient Arrived: [name] Any new allergies or adverse reactions: No Arrival Time: 09:18 Had a fall or experienced change in No Accompanied By: self activities of daily living that may affect Transfer Assistance: None risk of falls: Patient Identification Verified: Yes Signs or symptoms of abuse/neglect since last visito No Secondary Verification Process Completed: Yes Hospitalized since last visit: No Patient Requires Transmission-Based Precautions: No Implantable device outside of the clinic excluding No Patient Has Alerts: Yes cellular tissue based products placed in the center Patient Alerts: L ABI =1.12, TBI = .92 since last visit: R ABI= 1.02, TBI= .58 Has Dressing in Place as Prescribed: Yes Has Compression in Place as Prescribed: Yes Pain Present Now: Yes Electronic Signature(s) Signed: 04/17/2020 5:21:16 PM By: [name] Gouty RN, BSN Entered By: [name] on 04/17/2020 09:20:32 -------------------------------------------------------------------------------- Compression Therapy Details Patient Name: Date of Service: [name], [name] 04/17/2020 9:00 A M Medical Record Number: [id number] Patient Account Number: [id number] Date of Birth/Sex: Treating RN: [name] (64 y.o. [name] Primary Care [name]: [name] Other Clinician: Referring [name] [name]: Treating [name]: [name] in  Treatment: 13 Compression Therapy Performed for Wound Assessment: Wound #15 Left,Medial Malleolus Performed By: Clinician [name] Gouty, RN Compression Type: Rolena Infante Electronic Signature(s) Signed: 04/17/2020 5:21:16 PM By: [name] Gouty RN, BSN Entered By: [name] on 04/17/2020 09:34:06 -------------------------------------------------------------------------------- Encounter Discharge Information Details Patient Name: Date of Service: [name], [name] 04/17/2020 9:00 A M Medical Record Number: [id number] Patient Account Number: [id number] Date of Birth/Sex: Treating RN: [name] (64 y.o. [name] Primary Care [name]: [name] Other Clinician: Referring [name]: Treating [name]/Extender: [name] in Treatment: 13 Encounter Discharge Information Items Discharge Condition: Stable Ambulatory Status: Walker Discharge Destination: Home Transportation: Private Auto Accompanied By: self Schedule Follow-up Appointment: Yes Clinical Summary of Care: Patient Declined Electronic Signature(s) Signed: 04/17/2020 5:21:16 PM By: [name] Gouty RN, BSN Entered By: [name] on 04/17/2020 09:35:44 -------------------------------------------------------------------------------- Patient/Caregiver Education Details Patient Name: Date of Service: [name], [name] 11/16/2021andnbsp9:00 A M Medical Record Number: [id number] Patient Account Number: [id number] Date of Birth/Gender: Treating RN: [name] (64 y.o. [name] Primary Care Physician: [name] Other Clinician: Referring Physician: Treating Physician/Extender: [name] in Treatment: 13 Education Assessment Education Provided To: Patient Education Topics Provided Wound/Skin Impairment: Methods: Explain/Verbal Responses: Reinforcements needed, State content correctly [employer]) Signed: 04/17/2020  5:21:16 PM By: [name] Gouty RN, BSN Entered By: [name] on 04/17/2020 09:35:25 -------------------------------------------------------------------------------- Wound Assessment Details Patient Name: Date of Service: [name], [name] 04/17/2020 9:00 A M Medical Record Number: [id number] Patient Account Number: [id number] Date of Birth/Sex: Treating RN: [name] (64 y.o. [name] Primary Care [name]: [name] Other Clinician: Referring [name]: Treating [name]: [name] in Treatment: 13 Wound Status Wound Number: 15 Primary Venous Leg Ulcer Etiology: Wound Location: Left, Medial Malleolus Wound Status: Open Wounding Event: Gradually Appeared Comorbid Peripheral Venous Disease, Osteoarthritis, Confinement Date Acquired: 12/30/2019 History: Anxiety Weeks Of  Treatment: 13 Clustered Wound: No Wound Measurements Length: (cm) 4.4 Width: (cm) 1.6 Depth: (cm) 1.1 Area: (cm) 5.529 Volume: (cm) 6.082 % Reduction in Area: 52.6% % Reduction in Volume: 34.8% Epithelialization: Small (1-33%) Wound Description Classification: Full Thickness With Exposed Support Structures Wound Margin: Distinct, outline attached Exudate Amount: Medium Exudate Type: Serosanguineous Exudate Color: red, brown Foul [name] After Cleansing: No Slough/Fibrino Yes Wound Bed Granulation Amount: Medium (34-66%) Exposed Structure Granulation Quality: Red, Pink Fascia Exposed: No Necrotic Amount: Medium (34-66%) Fat Layer (Subcutaneous Tissue) Exposed: Yes Necrotic Quality: Adherent Slough Tendon Exposed: Yes Muscle Exposed: No Joint Exposed: No Bone Exposed: No Assessment Notes apligraf dressing in place, unable to visualize wound bed Treatment Notes Wound #15 (Left, Medial Malleolus) 2. Periwound Care Moisturizing lotion 4. Secondary Dressing ABD Pad Dry Gauze 6. Support Layer Retail banker) Signed: 04/17/2020 5:21:16 PM By: [name] Gouty RN, BSN Entered By: [name] on 04/17/2020 09:33:42 -------------------------------------------------------------------------------- Vitals Details Patient Name: Date of Service: [name], [name] 04/17/2020 9:00 A M Medical Record Number: [id number] Patient Account Number: [id number] Date of Birth/Sex: Treating RN: [name] (64 y.o. [name] Primary Care Stanislaus Kaltenbach: [name] Other Clinician: Referring Creedence Kunesh: Treating Malikye Reppond/Extender: [name] in Treatment: 13 Vital Signs Time Taken: 09:20 Temperature (F): 98.1 Height (in): 68 Pulse (bpm): 68 Source: Stated Respiratory Rate (breaths/min): 18 Blood Pressure (mmHg): 115/68 Reference Range: 80 - 120 mg / dl Electronic Signature(s) Signed: 04/17/2020 5:21:16 PM By: [name] Gouty RN, BSN Entered By: [name] on 04/17/2020 09:21:11

## 2020-04-24 ENCOUNTER — Encounter (HOSPITAL_BASED_OUTPATIENT_CLINIC_OR_DEPARTMENT_OTHER): Payer: Medicare Other | Admitting: Internal Medicine

## 2020-04-25 ENCOUNTER — Other Ambulatory Visit: Payer: Self-pay

## 2020-04-25 ENCOUNTER — Encounter (HOSPITAL_BASED_OUTPATIENT_CLINIC_OR_DEPARTMENT_OTHER): Payer: Medicare Other | Admitting: Physician Assistant

## 2020-04-25 DIAGNOSIS — I87332 Chronic venous hypertension (idiopathic) with ulcer and inflammation of left lower extremity: Secondary | ICD-10-CM | POA: Diagnosis not present

## 2020-04-25 NOTE — Progress Notes (Signed)
[name], [name] ([id number]) Visit Report for 04/25/2020 Arrival Information Details Patient Name: Date of Service: [employer] [name], [name] 04/25/2020 9:30 A M Medical Record Number: [id number] Patient Account Number: [id number] Date of Birth/Sex: Treating RN: 1956-06-01 (64 y.o. [name], Meta.Reding Primary Care Zarin Hagmann: [name] Other Clinician: Referring Vida Nicol: Treating Amani Marseille/Extender: [name] in Treatment: 14 Visit Information History Since Last Visit Added or deleted any medications: No Patient Arrived: Ambulatory Any new allergies or adverse reactions: No Arrival Time: 09:36 Had a fall or experienced change in No Accompanied By: self activities of daily living that may affect Transfer Assistance: None risk of falls: Patient Identification Verified: Yes Signs or symptoms of abuse/neglect since last visito No Secondary Verification Process Completed: Yes Hospitalized since last visit: No Patient Requires Transmission-Based Precautions: No Implantable device outside of the clinic excluding No Patient Has Alerts: Yes cellular tissue based products placed in the center Patient Alerts: L ABI =1.12, TBI = .92 since last visit: R ABI= 1.02, TBI= .58 Has Dressing in Place as Prescribed: Yes Has Compression in Place as Prescribed: Yes Pain Present Now: No Electronic Signature(s) Signed: 04/25/2020 4:29:16 PM By: Deon Pilling Entered By: Deon Pilling on 04/25/2020 09:50:05 -------------------------------------------------------------------------------- Compression Therapy Details Patient Name: Date of Service: [facility] [name], [name] 04/25/2020 9:30 A M Medical Record Number: [id number] Patient Account Number: [id number] Date of Birth/Sex: Treating RN: 1956/04/23 (64 y.o. [name] Primary Care Davielle Lingelbach: [name] Other Clinician: Referring Delford Wingert: Treating Lasean Rahming/Extender: [name] in Treatment:  14 Compression Therapy Performed for Wound Assessment: Wound #15 Left,Medial Malleolus Performed By: Jake Church, RN Compression Type: Rolena Infante Post Procedure Diagnosis Same as Pre-procedure Electronic Signature(s) Signed: 04/25/2020 4:58:27 PM By: Baruch Gouty RN, BSN Entered By: Baruch Gouty on 04/25/2020 10:17:34 -------------------------------------------------------------------------------- Encounter Discharge Information Details Patient Name: Date of Service: [name] [name], [name] 04/25/2020 9:30 A M Medical Record Number: [id number] Patient Account Number: [id number] Date of Birth/Sex: Treating RN: October 31, 1955 (64 y.o. [name] Primary Care Julia Alkhatib: [name] Other Clinician: Referring Vandy Tsuchiya: Treating Etan Vasudevan/Extender: [name] in Treatment: [phone] Encounter Discharge Information Items Post Procedure Vitals Discharge Condition: Stable Temperature (F): 98.2 Ambulatory Status: Cane Pulse (bpm): 58 Discharge Destination: Home Respiratory Rate (breaths/min): 18 Transportation: Private Auto Blood Pressure (mmHg): 123/66 Accompanied By: self Schedule Follow-up Appointment: Yes Clinical Summary of Care: Patient Declined Electronic Signature(s) Signed: 04/25/2020 4:15:58 PM By: [name] RN Entered By: [name] on 04/25/2020 10:44:41 -------------------------------------------------------------------------------- Lower Extremity Assessment Details Patient Name: Date of Service: [facility] [name], [name] 04/25/2020 9:30 A M Medical Record Number: [id number] Patient Account Number: [id number] Date of Birth/Sex: Treating RN: 1955-12-26 (63 y.o. [name] Primary Care Kimmarie Pascale: [name] Other Clinician: Referring Emera Bussie: Treating Jerzey Komperda/Extender: [name] Weeks in Treatment: 14 Edema Assessment Assessed: [Left: Yes] [Right: No] Edema: [Left: Ye] [Right: s] Calf Left:  Right: Point of Measurement: From Medial Instep 28 cm Ankle Left: Right: Point of Measurement: From Medial Instep 22.5 cm Vascular Assessment Pulses: Dorsalis Pedis Palpable: [Left:Yes] Electronic Signature(s) Signed: 04/25/2020 4:29:16 PM By: Deon Pilling Entered By: Deon Pilling on 04/25/2020 09:50:53 -------------------------------------------------------------------------------- Multi-Disciplinary Care Plan Details Patient Name: Date of Service: [facility] [name], [name] 04/25/2020 9:30 A M Medical Record Number: [id number] Patient Account Number: [id number] Date of Birth/Sex: Treating RN: 1955-10-23 (63 y.o. [name] Primary Care Sheila Ocasio: [name] Other Clinician:  Referring Kylen Ismael: Treating Tyia Binford/Extender: [name] in Treatment: 14 Active Inactive Wound/Skin Impairment Nursing Diagnoses: Impaired tissue integrity Knowledge deficit related to ulceration/compromised skin integrity Goals: Patient/caregiver will verbalize understanding of skin care regimen Date Initiated: 01/17/2020 Target Resolution Date: 05/07/2020 Goal Status: Active Ulcer/skin breakdown will have a volume reduction of 30% by week 4 Date Initiated: 01/17/2020 Date Inactivated: 03/12/2020 Target Resolution Date: 03/09/2020 Goal Status: Met Interventions: Assess patient/caregiver ability to obtain necessary supplies Assess patient/caregiver ability to perform ulcer/skin care regimen upon admission and as needed Assess ulceration(s) every visit Provide education on ulcer and skin care Notes: Electronic Signature(s) Signed: 04/25/2020 4:58:27 PM By: Baruch Gouty RN, BSN Entered By: Baruch Gouty on 04/25/2020 10:18:02 -------------------------------------------------------------------------------- Pain Assessment Details Patient Name: Date of Service: [name] [name], [name] 04/25/2020 9:30 A M Medical Record Number: [id number] Patient Account Number:  [id number] Date of Birth/Sex: Treating RN: April 25, 1956 (64 y.o. [name] Primary Care Merica Prell: [name] Other Clinician: Referring Avamarie Crossley: Treating Christyan Reger/Extender: [name] in Treatment: 14 Active Problems Location of Pain Severity and Description of Pain Patient Has Paino No Site Locations Rate the pain. Rate the pain. Current Pain Level: 0 Pain Management and Medication Current Pain Management: Medication: No Cold Application: No Rest: No Massage: No Activity: No T.E.N.S.: No Heat Application: No Leg drop or elevation: No Is the Current Pain Management Adequate: Adequate How does your wound impact your activities of daily livingo Sleep: No Bathing: No Appetite: No Relationship With Others: No Bladder Continence: No Emotions: No Bowel Continence: No Work: No Toileting: No Drive: No Dressing: No Hobbies: No Electronic Signature(s) Signed: 04/25/2020 4:29:16 PM By: Deon Pilling Entered By: Deon Pilling on 04/25/2020 09:50:38 -------------------------------------------------------------------------------- Patient/Caregiver Education Details Patient Name: Date of Service: [name], [name] 11/24/2021andnbsp9:30 A M Medical Record Number: [id number] Patient Account Number: [id number] Date of Birth/Gender: Treating RN: 1955/08/21 (64 y.o. [name] Primary Care Physician: [name] Other Clinician: Referring Physician: Treating Physician/Extender: [name] in Treatment: 14 Education Assessment Education Provided To: Patient Education Topics Provided Venous: Methods: Explain/Verbal Responses: Reinforcements needed, State content correctly Wound/Skin Impairment: Methods: Explain/Verbal Responses: Reinforcements needed, State content correctly Electronic Signature(s) Signed: 04/25/2020 4:58:27 PM By: Baruch Gouty RN, BSN Signed: 04/25/2020 4:58:27 PM By: Baruch Gouty RN, BSN Entered By: Baruch Gouty on 04/25/2020 10:18:31 -------------------------------------------------------------------------------- Wound Assessment Details Patient Name: Date of Service: [name], [name] 04/25/2020 9:30 A M Medical Record Number: [id number] Patient Account Number: [id number] Date of Birth/Sex: Treating RN: Mar 23, 1956 (64 y.o. [name], [name] Primary Care Gwendolynn Merkey: [name] Other Clinician: Referring Kanav Kazmierczak: Treating Lannah Koike/Extender: [name] in Treatment: 14 Wound Status Wound Number: 15 Primary Venous Leg Ulcer Etiology: Wound Location: Left, Medial Malleolus Wound Status: Open Wounding Event: Gradually Appeared Comorbid Peripheral Venous Disease, Osteoarthritis, Confinement Date Acquired: 12/30/2019 History: Anxiety Weeks Of Treatment: 14 Clustered Wound: No Wound Measurements Length: (cm) 4.3 Width: (cm) 1.7 Depth: (cm) 0.5 Area: (cm) 5.741 Volume: (cm) 2.871 % Reduction in Area: 50.8% % Reduction in Volume: 69.2% Epithelialization: Small (1-33%) Tunneling: No Undermining: No Wound Description Classification: Full Thickness With Exposed Support Structures Wound Margin: Distinct, outline attached Exudate Amount: Medium Exudate Type: Purulent Exudate Color: yellow, brown, green Foul Odor After Cleansing: No Slough/Fibrino Yes Wound Bed Granulation Amount: Medium (34-66%) Exposed Structure Granulation Quality: Red, Pink Fascia Exposed: No Necrotic Amount: Medium (34-66%) Fat Layer (Subcutaneous Tissue) Exposed: Yes  Necrotic Quality: Adherent Slough Tendon Exposed: Yes Muscle Exposed: No Joint Exposed: No Bone Exposed: No Treatment Notes Wound #15 (Left, Medial Malleolus) 1. Cleanse With Wound Cleanser Soap and water 2. Periwound Care Moisturizing lotion 3. Primary Dressing Applied Other primary dressing (specifiy in notes) 4. Secondary Dressing ABD Pad Dry Gauze 6. Support Layer  Production assistant, radio Notes apligraft as ordered , Event organiser) Signed: 04/25/2020 4:29:16 PM By: Deon Pilling Entered By: Deon Pilling on 04/25/2020 09:52:03 -------------------------------------------------------------------------------- Vitals Details Patient Name: Date of Service: [name], [name] 04/25/2020 9:30 A M Medical Record Number: [id number] Patient Account Number: [id number] Date of Birth/Sex: Treating RN: 1955-09-28 (64 y.o. [name], Meta.Reding Primary Care Millenia Waldvogel: [name] Other Clinician: Referring Sachi Boulay: Treating Clarie Camey/Extender: [name] in Treatment: 14 Vital Signs Time Taken: 09:37 Temperature (F): 98.2 Height (in): 68 Pulse (bpm): 58 Respiratory Rate (breaths/min): 18 Blood Pressure (mmHg): 123/66 Reference Range: 80 - 120 mg / dl Electronic Signature(s) Signed: 04/25/2020 4:29:16 PM By: Deon Pilling Entered By: Deon Pilling on 04/25/2020 09:50:24

## 2020-04-30 NOTE — Progress Notes (Addendum)
[name] ([id number]) Visit Report for 04/25/2020 Chief Complaint Document Details Patient Name: Date of Service: [facility] [name] 04/25/2020 9:30 A M Medical Record Number: [id number] Patient Account Number: [id number] Date of Birth/Sex: Treating RN: 16-Oct-1955 (64 y.o. [name] Primary Care Provider: [name] Other Clinician: Referring Provider: Treating Provider/Extender: [name] in Treatment: 14 Information Obtained from: Patient Chief Complaint patient is been followed long-term in this clinic for venous insufficiency ulcers with inflammation, hypertension and ulceration over the medial ankle bilaterally. 01/17/2020; this is a patient who is here for review of postoperative wounds on the left lateral ankle and recurrence of venous stasis ulceration on the left medial Electronic Signature(s) Signed: 04/25/2020 9:44:56 AM By: [name] PA-C Entered By: [name] on 04/25/2020 09:44:55 -------------------------------------------------------------------------------- Cellular or Tissue Based Product Details Patient Name: Date of Service: [facility] [name], [name] 04/25/2020 9:30 A M Medical Record Number: [id number] Patient Account Number: [id number] Date of Birth/Sex: Treating RN: 11/13/1955 (64 y.o. [name] Primary Care Provider: [name] Other Clinician: Referring Provider: Treating Provider/Extender: [name] in Treatment: 14 Cellular or Tissue Based Product Type Wound #15 Left,Medial Malleolus Applied to: Performed By: Physician [name], PA Cellular or Tissue Based Product Type: Apligraf Level of Consciousness (Pre-procedure): Awake and Alert Pre-procedure Verification/Time Out Yes - 10:10 Taken: Location: trunk / arms / legs Wound Size (sq cm): 7.31 Product Size (sq cm): 44 Waste Size (sq cm): 22 Waste Reason: wound size Amount of Product Applied (sq cm):  22 Instrument Used: Forceps, Scissors Lot #: [id number] Order #: 2 Expiration Date: 05/03/2020 Fenestrated: Yes Instrument: Blade Reconstituted: Yes Solution Type: saline Solution Amount: 10 ml Lot #: [id number] Solution Expiration Date: 06/29/2021 Secured: Yes Secured With: Steri-Strips Dressing Applied: Yes Primary Dressing: drawtex, adaptic, gauze Procedural Pain: 0 Post Procedural Pain: 0 Response to Treatment: Procedure was tolerated well Level of Consciousness (Post- Awake and Alert procedure): Post Procedure Diagnosis Same as Pre-procedure Electronic Signature(s) Signed: 04/25/2020 4:58:27 PM By: Baruch Gouty RN, BSN Signed: 04/25/2020 5:04:05 PM By: [name] PA-C Entered By: Baruch Gouty on 04/25/2020 10:17:15 -------------------------------------------------------------------------------- Debridement Details Patient Name: Date of Service: [name] [name], [name] 04/25/2020 9:30 A M Medical Record Number: [id number] Patient Account Number: [id number] Date of Birth/Sex: Treating RN: 05-23-1956 (64 y.o. [name] Primary Care Provider: [name] Other Clinician: Referring Provider: Treating Provider/Extender: [name] in Treatment: 14 Debridement Performed for Assessment: Wound #15 Left,Medial Malleolus Performed By: Physician [name], PA Debridement Type: Debridement Severity of Tissue Pre Debridement: Fat layer exposed Level of Consciousness (Pre-procedure): Awake and Alert Pre-procedure Verification/Time Out Yes - 10:00 Taken: Start Time: 10:04 Pain Control: Lidocaine 5% topical ointment T Area Debrided (L x W): otal 4.3 (cm) x 1.7 (cm) = 7.31 (cm) Tissue and other material debrided: Viable, Non-Viable, Slough, Subcutaneous, Slough Level: Skin/Subcutaneous Tissue Debridement Description: Excisional Instrument: Curette Bleeding: Minimum Hemostasis Achieved: Pressure End Time: 10:11 Procedural  Pain: 5 Post Procedural Pain: 3 Response to Treatment: Procedure was tolerated well Level of Consciousness (Post- Awake and Alert procedure): Post Debridement Measurements of Total Wound Length: (cm) 4.3 Width: (cm) 1.7 Depth: (cm) 0.5 Volume: (cm) 2.871 Character of Wound/Ulcer Post Debridement: Improved Severity of Tissue Post Debridement: Fat layer exposed Post Procedure Diagnosis Same as Pre-procedure Electronic Signature(s) Signed: 04/25/2020 4:58:27 PM By: Baruch Gouty RN, BSN Signed: 04/25/2020 5:04:05 PM By:  Melburn Hake, Edom Schmuhl PA-C Entered By: Baruch Gouty on 04/25/2020 10:14:01 -------------------------------------------------------------------------------- HPI Details Patient Name: Date of Service: [facility] [name] 04/25/2020 9:30 A M Medical Record Number: [id number] Patient Account Number: [id number] Date of Birth/Sex: Treating RN: Feb 09, 1956 (64 y.o. [name] Primary Care Provider: [name] Other Clinician: Referring Provider: Treating Provider/Extender: [name] in Treatment: 14 History of Present Illness HPI Description: the remaining wound is over the left medial ankle. Similar wound over the right medial ankle healed largely with use of Apligraf. Most recently we have been using Hydrofera Blue over this wound with considerable improvement. The patient has been extensively worked up in the past for her venous insufficiency and she is not a candidate for antireflux surgery although I have none of the details available currently. 08/24/14; considerable improvement today. About 50% of this wound areas now epithelialized. The base of the wound appears to be healthier granulation.as opposed to last week when she had deteriorated a considerable improvement 08/17/14; unfortunately the wound has regressed somewhat. The areas of epithelialization from the superior aspect are not nearly as healthy as they were last week. The  patient thinks her Hydrofera Blue slipped. 09/07/14; unfortunately the area has markedly regressed in the 2 weeks since I've seen this. There is an odor surrounding erythema. The healthy granulation tissue that we had at the base of the wound now is a dusky color. The nurse reports green drainage 09/14/14; the area looks somewhat better than last week. There is less erythema and less drainage. The culture I did did not show any growth. Nevertheless I think it is better to continue the Cipro and doxycycline for a further week. The remaining wound area was debridement. 09/21/14. Wound did not require debridement last week. Still less erythema and less drainage. She can complete her antibiotics. The areas of epithelialization in the superior aspect of the wound do not look as healthy as they did some weeks ago 10/05/14 continued improvement in the condition of this wound. There is advancing epithelialization. Less aggressive debridement required 10/19/14 continued improvement in the condition and volume of this wound. Less aggressive debridement to the inferior part of this to remove surface slough and fibrinous eschar 11/02/14 no debridement is required. The surface granulation appears healthy although some of her islands of epithelialization seem to have regressed. No evidence of infection 11/16/14; lites surface debridement done of surface eschar. The wound does not look to be unhealthy. No evidence of infection. Unfortunately the patient has had podiatry issues in the right foot and for some reason has redeveloped small surface ulcerations in the medial right ankle. Her original presentation involved wounds in this area 11/23/14 no debridement. The area on the right ankle has enlarged. The left ankle wound appears stable in terms of the surface although there is periwound inflammation. There has been regression in the amount of new skin 11/30/14 no debridement. Both wound areas appear healthy. There was no  evidence of infection. The the new area on the right medial ankle has enlarged although that both the surfaces appear to be stable. 12/07/14; Debridement of the right medial ankle wound. No no debridement was done on the left. 12/14/14 no major change in and now bilateral medial ankle wounds. Both of these are very painful but the no overt evidence of infection. She has had previous venous ablation 12/21/14; patient states that her right medial ankle wound is considerably more painful last week than usual. Her left is  also somewhat painful. She could not tolerate debridement. The right medial ankle wound has fibrinous surface eschar 12/28/14 this is a patient with severe bilateral venous insufficiency ulcers. For a considerable period of time we actually had the one on the right medial ankle healed however this recently opened up again in June. The left medial ankle wound has been a refractory area with some absent flows. We had some success with Hydrofera Blue on this area and it literally closed by 50% however it is recently opened up Foley. Both of these were debridement today of surface eschar. She tolerates this poorly 01/25/15: No change in the status of this. Thick adherent escar. Very poor tolerance of any attempt at debridement. I had healed the right medial malleolus wound for a considerable amount of time and had the left one down to about 50% of the volume although this is totally regressed over the last 48 weeks. Further the right leg has reopened. she is trying to make a appointment with pain and vascular, previous ablations with Dr. Aleda Grana. I do not believe there is an arterial insufficiency issue here 02/01/15 the status of the adherent eschar bilaterally is actually improved. No debridement was done. She did not manage to get vascular studies done 02/08/15 continued debridement of the area was done today. The slough is less adherent and comes off with less pressure. There is no  surrounding infection peripheral pulses are intact 02/15/15 selective debridement with a disposable curette. Again the slough is less adherent and comes off with less difficulty. No surrounding infection peripheral pulses are intact. 02/22/15 selective debridement of the right medial ankle wound. Slough comes off with less difficulty. No obvious surrounding infection peripheral pulses are intact I did not debridement the one on the left. Both of these are stable to improved 03/01/15 selective debridement of both wound areas using a curette to. Adherent slough cup soft with less difficulty. No obvious surrounding infection. The patient tells me that 2 days ago she noted a rash above the right leg wrap. She did not have this on her lower legs when she change this over she arrives with widespread left greater than right almost folliculitis-looking rash which is extremely pruritic. I don't see anything to culture here. There is no rash on the rest of her body. She feels well systemically. 03/08/15; selective debridement of both wounds using a curette. Base of this does not look unhealthy. She had limegreen drainage coming out of the left leg wound and describes a lot of drainage. The rash on her left leg looks improved to. No cultures were done. 03/22/15; patient was not here last week. Basal wounds does not look healthy and there is no surrounding erythema. No drainage. There is still a rash on the left leg that almost looks vasculitic however it is clearly limited to the top of where the wrap would be. 04/05/15; on the right required a surgical debridement of surface eschar and necrotic subcutaneous tissue. I did not debridement the area on the left. These continue to be large open wounds that are not changing that much. We were successful at one point in healing the area on the right, and at the same time the area on the left was roughly half the size of current measurements. I think a lot of the  deterioration has to do with the prolonged time the patient is on her feet at work 04/19/15 I attempted-like surface debridement bilaterally she does not tolerate this. She tells me that she  was in allergic care yesterday with extreme pain over her left lateral malleolus/ankle and was told that she has an "sprain" 05/03/15; large bilateral venous insufficiency wounds over the medial malleolus/medial aspect of her ankles. She complains of copious amounts of drainage and his usual large amounts of pain. There is some increasing erythema around the wound on the right extending into the medial aspect of her foot to. historically she came in with these wounds the right one healed and the left one came down to roughly half its current size however the right one is reopened and the left is expanded. 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 05/10/15 large bilateral venous insufficiency wounds. There is less adherence surface left however the surface culture that I did last week grew pseudomonas therefore bilateral selective debridement score necessary. There is surrounding erythema. The patient describes severe bilateral drainage and a lot of pain in the left ankle. Apparently her podiatrist was were ready to do a cortisone shot 05/17/15; the patient complains of pain and again copious amounts of drainage. 05/24/15; we used Iodo flex last week. Patient notes considerable improvement in wound drainage. Only needed to change this once. 05/31/15; we continued Iodoflex; the base of these large wounds bilaterally is not too bad but there is probably likely a significant bioburden here. I would like to debridement just doesn't tolerate it. 06/06/14 I would like to continue the Iodoflex although she still hasn't managed to obtain supplies. She has bilateral medial malleoli or large wounds which are mostly superficial. Both of them are covered circumferentially with  some nonviable fibrinous slough although she tolerates debridement very poorly. She apparently has an appointment for an ablation on the right leg by interventional radiology. 06/14/15; the patient arrives with the wounds and static condition. We attempted a debridement although she does not do well with this secondary to pain. I 07/05/15; wounds are not much smaller however there appears to be a cleaner granulating base. The left has tight fibrinous slough greater than the right. Debridement is tolerated poorly due to pain. Iodoflex is done more for these wounds in any of the multitude of different dressings I have tried on the left 1 and then subsequently the right. 07/12/15; no change in the condition of this wound. I am able to do an aggressive debridement on the right but not the left. She simply cannot tolerate it. We have been using Iodoflex which helps somewhat. It is worthwhile remembering that at one point we healed the right medial ankle wound and the left was about 25% of the current circumference. We have suggested returning to vascular surgery for review of possible further ablations for one reason or another she has not been able to do this. 07/26/15 no major change in the condition of either wound on her medial ankle. I did not attempt to debridement of these. She has been aggressively scrubbing these while she is in the shower at home. She has her supply of Iodoflex which seems to have done more for these wounds then anything I have put on recently. 08/09/15 wound area appears larger although not verified by measurements. Using Iodoflex 09/05/2015 -- she was here for avisit today but had significant problems with the wound and I was asked to see her for a physician opinion. I have summarize that this lady has had surgery on her left lower extremity about 10 years ago where the possible veins  stripping was done. She has had an opinion from interventional radiology around November 2016 where  no further sclerotherapy was ordered. The patient works 12 hours a day and stands on a concrete floor with work boots and is unable to get the proper compression she requires and cannot elevate her limbs appropriately at any given time. She has recently grown Pseudomonas from her wound culture but has not started her ciprofloxacin which was called in for her. 09/13/15 this continues to be a difficult situation for this patient. At one point I had this wound down to a 1.5 x 1.5" wound on her left leg. This is deteriorated and the right leg has reopened. She now has substantial wounds on her medial calcaneus, malleoli and into her lower leg. One on the left has surface eschar but these are far too painful for me to debridement here. She has a vascular surgery appointment next week to see if anything can be done to help here. I think she has had previous ablations several years ago at [facility]. She has no major edema. She tells me that she did not get product last time [facility]] and went for several days without it. She continues to work in work boots 12 hours a day. She cannot get compression/4-layer under her work boots. 09/20/15 no major change. Periwound edema control was not very good. Her point with pain and vascular is next Wednesday the 25th 09/28/15; the patient is seen vascular surgery and is apparently scheduled for repeat duplex ultrasounds of her bilateral lower legs next week. 10/05/15; the patient was seen by Dr. Doren Custard of vascular surgery. He feels that she should have arterial insufficiency excluded as cause/contributed to her nonhealing stage she is therefore booked for an arteriogram. She has apparently monophasic signals in the dorsalis pedis pulses. She also of course has known severe chronic venous insufficiency with previous procedures as noted previously. I had another long discussion with the patient today about her continuing to work 12 hour shifts. I've written her out for 2  months area had concerns about this as her work location is currently undergoing significant turmoil and this may lead to her termination. She is aware of this however I agree with her that she simply cannot continue to stand for 12 hours multiple days a week with the substantial wound areas she has. 10/19/15; the Dr. Doren Custard appointment was largely for an arteriogram which was normal. She does not have an arterial issue. He didn't make a comment about her chronic venous insufficiency for which she has had previous ablations. Presumably it was not felt that anything additional could be done. The patient is now out of work as I prescribed 2 weeks ago. Her wounds look somewhat less aggravated presumably because of this. I felt I would give debridement another try today 10/25/15; no major change in this patient's wounds. We are struggling to get her product that she can afford into her own home through her insurance. 11/01/15; no major change in the patient's wounds. I have been using silver alginate as the most affordable product. I spoke to Dr. Marla Roe last week with her requested take her to the OR for surgical debridement and placement of ACEL. Dr. Marla Roe told me that she would be willing to do this however [facility] will not cover this, fortunately the patient has [place] healthcare of some variant 11/08/15; no major change in the patient's wounds. She has been completely nonviable surface that this but is in  too much pain with any attempted debridement are clinic. I have arranged for her to see Dr. Marla Roe ham of plastic surgery and this appointment is on Monday. I am hopeful that they will take her to the OR for debridement, possible ACEL ultimately possible skin graft 11/22/15 no major change in the patient's wounds over her bilateral medial calcaneus medial malleolus into the lower legs. Surface on these does not look too bad however on the left there is surrounding erythema and  tenderness. This may be cellulitis or could him sleepy tinea. 11/29/15; no major changes in the patient's wounds over her bilateral medial malleolus. There is no infection here and I don't think any additional antibiotics are necessary. There is now plan to move forward. She sees Dr. Marla Roe in a week's time for preparation for operative debridement and ACEL placement I believe on 7/12. She then has a follow-up appointment with Dr. Marla Roe on 7/21 12/28/15; the patient returns today having been taken to the [place] by Dr. Marla Roe 12/12/15 she underwent debridement, intraoperative cultures [which were negative]. She had placement of a wound VAC. Parent really ACEL was not available to be placed. The wound VAC foam apparently adhered to the wound since then she's been using silver alginate, Xeroform under Ace wraps. She still says there is a lot of drainage and a lot of pain 01/31/16; this is a patient I see monthly. I had referred her to Dr. Marla Roe him of plastic surgery for large wounds on her bilateral medial ankles. She has been to the OR twice once in early July and once in early August. She tells me over the last 3 weeks she has been using the wound VAC with ACEL underneath it. On the right we've simply been using silver alginate. Under Kerlix Coban wraps. 02/28/16; this is a patient I'm currently seeing monthly. She is gone on to have a skin graft over her large venous insufficiency ulcer on the left medial ankle. This was done by Dr. Marla Roe him. The patient is a bit perturbed about why she didn't have one on her right medial ankle wound. She has been using silver alginate to this. 03/06/16; I received a phone call from her plastic surgery Dr. Marla Roe. She expressed some concern about the viability of the skin graft she did on the left medial ankle wound. Asked me to place Endoform on this. She told me she is not planning to do a subsequent skin graft on the right as the left one did  not take very well. I had placed Hydrofera Blue on the right 03/13/16; continue to have a reasonably healthy wound on the right medial ankle. Down to 3 mm in terms of size. There is epithelialization here. The area on the left medial ankle is her skin graft site. I suppose the last week this looks somewhat better. She has an open area inferiorly however in the center there appears to be some viable tissue. There is a lot of surface callus and eschar that will eventually need to come off however none of this looked to be infected. Patient states that the is able to keep the dressing on for several days which is an improvement. 03/20/16 no major change in the circumference of either wound however on the left side the patient was at Dr. Eusebio Friendly office and they did a debridement of left wound. 50% of the wound seems to be epithelialized. I been using Endoform on the left Hydrofera Blue in the right 03/27/16; she arrives today  with her wound is not looking as healthy as they did last week. The area on the right clearly has an adherent surface to this a very similar surface on the left. Unfortunately for this patient this is all too familiar problem. Clearly the Endoform is not working and will need to change that today that has some potential to help this surface. She does not tolerate debridement in this clinic very well. She is changing the dressing wants 04/03/16; patient arrives with the wounds looking somewhat better especially on the right. Dr. Migdalia Dk change the dressing to silver alginate when she saw her on Monday and also sold her some compression socks. The usefulness of the latter is really not clear and woman with severely draining wounds. 04/10/16; the patient is doing a bit of an experiment wearing the compression stockings that Dr. Migdalia Dk provided her to her left leg and the out of legs based dressings that we provided to the right. 05/01/16; the patient is continuing to wear compression  stockings Dr. Migdalia Dk provided her on the left that are apparently silver impregnated. She has been using Iodoflex to the right leg wound. Still a moderate amount of drainage, when she leaves here the wraps only last for 4 days. She has to change the stocking on the left leg every night 05/15/16; she is now using compression stockings bilaterally provided by Dr. Marla Roe. She is wearing a nonadherent layer over the wounds so really I don't think there is anything specific being done to this now. She has some reduction on the left wound. The right is stable. I think all healing here is being done without a specific dressing 06/09/16; patient arrives here today with not much change in the wound certainly in diameter to large circular wounds over the medial aspect of her ankle bilaterally. Under the light of these services are certainly not viable for healing. There is no evidence of surrounding infection. She is wearing compression stockings with some sort of silver impregnation as prescribed by Dr. Marla Roe. She has a follow-up with her tomorrow. 06/30/16; no major change in the size or condition of her wounds. These are still probably covered with a nonviable surface. She is using only her purchase stockings. She did see Dr. Marla Roe who seemed to want to apply Dakin's solution to this I'm not extreme short what value this would be. I would suggest Iodoflex which she still has at home. 07/28/16; I follow Mrs. Schreffler episodically along with Dr. Marla Roe. 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 She has been applying a topical collagen ointment to both wounds with Adaptic. I don't think Dr. Marla Roe is planning to take her back to the OR. 08/19/16; I follow Mrs. Jeneen Rinks on a monthly basis along with Dr. Marla Roe of plastic surgery. She has very refractory venous insufficiency wounds on the bilateral medial lower legs left greater than  right. I been following her for a number of years. At one point I was able to get the right medial malleolus wound to heal and had the left medial malleolus down to about half its current size however and I had to send her to plastic surgery for an operative debridement. Since then things have been stable to slightly improve the area on the right is slightly better one in the left about the same although there is much less adherent surface than I'm used to with this patient. She is using some form of liquid collagen gel  that Dr. Marla Roe provided a Kerlix cover with the patient's own pressure stockings. She tells me that she has extreme pain in both ankles and along the lateral aspect of both feet. She has been unable to work for some period of time. She is telling me she is retiring at the beginning of April. She sees Dr. Doran Durand of orthopedics next week 09/22/16; patient has not seen Dr. Marla Roe since the last time she is here. I'm not really sure what she is using to the wounds other than bits and pieces of think she had left over including most recently Hydrofera Blue. She is using juxtalite stockings. She is having difficulty with her husband's recent illness "stroke". She is having to transport him to various doctors appointments. Dr. Marla Roe left her the option of a repeat debridement with ACEL however she has not been able to get the time to follow-up on this. She continues to have a fair amount of drainage out of these wounds with certainly precludes leaving dressings on all week 10/13/16; patient has not seen Dr. Marla Roe since she was last in our clinic. I'm not really sure what she is doing with the wounds, we did try to get her [facility] and I think she is actually using this most of the time. Because of drainage she states she has to change this every second day although this is an improvement from what she used to do. She went to see Dr. Doran Durand who did not think she had a  muscular issue with regards to her feet, he referred her to a neurologist and I think the appointment is sometime in June. I changed her back to Iodoflex which she has used in the past but not recently. 11/03/16; the patient has been using Iodoflex although she ran out of this. Still claims that there is a lot of drainage although the wound does not look like this. No surrounding erythema. She has not been back to see Dr. Marla Roe 11/24/16; the patient has been using Iodoflex again but she ran out of it 2 or 3 days ago. There is no major change in the condition of either one of these wounds in fact they are larger and covered in a thick adherent surface slough/nonviable tissue especially on the left. She does not tolerate mechanical debridement in our clinic. Going back to see Dr. Marla Roe of plastic surgery for an operative debridement would seem reasonable. 12/15/16; the patient has not been back to see Dr. Marla Roe. She is been dealing with a series of illnesses and her husband which of monopolized her time. She is been using Sorbact which we largely supplied. She states the drainage is bad enough that it maximum she can go 2-3 days without changing the dressing 01/12/2017 -- the patient has not been back for about 4 weeks and has not seen Dr. Marla Roe not does she have any appointment pending. 01/23/17; patient has not seen Dr. Marla Roe even though I suggested this previously. She is using Santyl that was suggested last week by Dr. Con Memos this Cost her $16 through her insurance which is indeed surprising 02/12/17; continuing Santyl and the patient is changing this daily. A lot of drainage. She has not been back to see plastic surgery she is using an Ace wrap. Our intake nurse suggested wrap around stockings which would make a good reasonable alternative 02/26/17; patient is been using Santyl and changing this daily due to drainage. She has not been to see plastic surgery she uses in April  Ace  wrap to control the edema. She did obtain extremitease stockings but stated that the edema in her leg was to big for these 03/20/17; patient is using Santyl and Anasept. Surfaces looked better today the area on the right is actually measuring a little smaller. She has states she has a lot of pain in her feet and ankles and is asking for a consult to pain control which I'll try to help her with through our case manager. 04/10/17; the patient arrives with better-looking wound surfaces and is slightly smaller wound on the left she is using a combination of Santyl and Anasept. She has an appointment or at least as started in the pain control center associated with [facility] 05/14/17; this is a patient who I followed for a prolonged period of time. 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 At one point I had this down to a much smaller wound on the left however these reopened and we've never been able to get these to heal. She has been using Santyl and Anasept gel although 2 weeks ago she ran out of the Anasept gel. She has a stable appearance of the wound. She is going to the wound care clinic at [facility]. They wanted do a nerve block/spinal block although she tells me she is reluctant to go forward with that. 05/21/17; this is a patient I have followed for many years. 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 Chronic pain and deformity in her ankles as well. She is been to see plastic surgery as well as orthopedics. Using PolyMem AG most recently/Kerramax/ABDs and 2 layer compression. She has managed to keep this on and she is coming in for a nurse check to change the dressing on Tuesdays, we see her on Fridays 06/05/17; really quite a good looking surface and the area especially on the right medial has contracted in terms of dimensions. Well granulated healthy-looking tissue on both sides. Even with an open curet there is nothing that even  feels abnormal here. This is as good as I've seen this in quite some time. We have been using PolyMem AG and bringing her in for a nurse check 06/12/17; really quite good surface on both of these wounds. The right medial has contracted a bit left is not. We've been using PolyMem and AG and she is coming in for a nurse visit 06/19/17; we have been using PolyMem AG and bringing her in for a nurse check. Dimensions of her wounds are not better but the surfaces looked better bilaterally. She complained of bleeding last night and the left wound and increasing pain bilaterally. She states her wound pain is more neuropathic than just the wounds. There was some suggestion that this was radicular from her pain management doctor in talking to her it is really difficult to sort this out. 06/26/17; using PolyMem and AG and bringing her in for a nurse check as All of this and reasonably stable condition. Certainly not improved. The dimensions on the lateral part of the right leg look better but not really measuring better. The medial aspect on the left is about the same. 07/03/16; we have been using PolyMen AG and bringing her in for a nurse check to change the dressings as the wounds have drainage which precludes once weekly changing. We are using all secondary absorptive dressings.our intake nurse is brought up the idea of using a wound VAC/snap VAC on the wound to help with the drainage  to see if this would result in some contraction. This is not a bad idea. The area on the right medial is actually looking smaller. Both wounds have a reasonable-looking surface. There is no evidence of cellulitis. The edema is well controlled 07/10/17; the patient was denied for a snap VAC by her insurance. The major issue with these wounds continues to be drainage. We are using wicked PolyMem AG and she is coming in for a nurse visit to change this. The wounds are stable to slightly improved. The surface looks vibrant and the area on  the right certainly has shrunk in size but very slowly 07/17/17; the patient still has large wounds on her bilateral medial malleoli. Surface of both of these wounds looks better. The dimensions seem to come and go but no consistent improvement. There is no epithelialization. We do not have options for advanced treatment products due to insurance issues. They did not approve of the wound VAC to help control the drainage. More recently we've been using PolyMem and AG wicked to allow drainage through. We have been bringing her in for a nurse visit to change this. We do not have a lot of options for wound care products and the home again due to insurance issues 07/24/17; the patient's wound actually looks somewhat better today. No drainage measurements are smaller still healthy-looking surface. We used silver collagen under PolyMen started last week. We have been bringing her in for a dressing change 07/31/17; patient's wound surface continued to look better and I think there is visible change in the dimensions of the wound on the right. Rims of epithelialization. We have been using silver collagen under PolyMen and bringing her in for a dressing change. There appears to be less drainage although she is still in need of the dressing change 08/07/17. Patient's wound surface continues to look better on both sides and the area on the right is definitely smaller. We have been using silver collagen and PolyMen. She feels that the drainage has been it has been better. I asked her about her vascular status. She went to see Dr. Aleda Grana at [facility] and had some form of ablation. I don't have much detail on this. I haven't my notes from 2016 that she was not a candidate for any further ablation but I don't have any more information on this. We had referred her to vein and vascular I don't think she ever went. He does not have a history of PAD although I don't have any information on this either. We don't even  have ABIs in our record 08/14/17; we've been using silver collagen and PolyMen cover. And putting the patient and compression. She we are bringing her in as a nurse visit to change this because ofarge amount of drainage. We didn't the ABIs in clinic today since they had been done in many moons 1.2 bilaterally. She has been to see vein and vascular however this was at [facility] and she had ablation although I really don't have any information on this all seemed biking get a report. She is also been operatively debrided by plastic surgery and had a cell placed probably 8-12 months ago. This didn't have a major effect. We've been making some gains with current dressings 08/19/17-She is here in follow-up evaluation for bilateral medial malleoli ulcers. She continues to tolerate debridement very poorly. We will continue with recently changed topical treatment; if no significant improvement may consider switching to Iodosorb/Iodoflex. 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 She has been using silver collagen and PolyMem. I believe she has been used and tried on Iodoflex before. 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 These have been very refractory over time. We have been using silver collagen and PolyMen. She is coming in once a week for a doctor's and once a week for nurse visits. We are actually making some progress 09/18/17; the patient's wounds are smaller especially on the right medial. She arrives today to upset to consider even washing these off with Anasept which I think is been part of the reason this is been closing. We've been using collagen covered in PolyMen otherwise. It is noted that she has a small area of folliculitis on the right medial calf  that. As we are wrapping her legs I'll give her a short course of doxycycline to make sure this doesn't amount to anything. She is a long list of complaints today including imbalance, shortness of breath on exertion, inversion of her left ankle. With regards to the latter complaints she is been to see orthopedics and they offered her a tendon release surgery I believe but wanted her wounds to be closed first. I have recommended she go see her primary physician with regards to everything else. 09/25/17; patient's wounds are about the same size. We have made some progress bilaterally although not in recent weeks. She will not allow me T wash these wounds with Anasept even if she is doing her cell. Wheeze we've been using collagen covered in PolyMen. Last week she had a small area of folliculitis this is now opened into a small wound. She completed 5 days of trimethoprim sulfamethoxazole 10/02/17; unfortunately the area on her left medial ankle is worse with a larger wound area towards the Achilles. The patient complains of a lot of pain. She will not allow debridement although visually I don't think there is anything to debridement in any case. We have been using silver collagen and PolyMen for several months now. Initially we are making some progress although I'm not really seeing that today. We will move back to [facility]. His admittedly this is a bit of a repeat however I'm hoping that his situation is different now. The patient tells me she had her leg on the left give out on her yesterday this is process some pain. 10/09/17; the patient is seen twice a week largely because of drainage issues coming out of the chronic medial bimalleolar wounds that are chronic. Last week the dimensions of the one on the left looks a little larger I changed her to [facility]. She comes in today with a history of terrible pain in the bilateral wound areas. She will not allow debridement. She will not even allow a  tissue culture. There is no surrounding erythema no no evidence of cellulitis. We have been putting her Kerlix Coban man. She will not allow more aggressive compression as there was a suggestion to put her in 3 layer wraps. 10/16/17; large wounds on her bilateral medial malleoli. These are chronic. Not much change from last week. The surface looks have healthy but absolutely no epithelialization. A lot of pain little less so of drainage. She will not allow debridement or even washing these off in the vigorous fashion with Anasept. 10/23/17; large wounds  on her bilateral malleoli which are chronic. Some improvement in terms of size perhaps on the right since last time I saw these. She states that after we increased the 3 layer compression there was some bleeding, when she came in for a nurse visit she did not want 3 layer compression put back on about our nurse managed to convince her. She has known chronic venous visit issues and I'm hoping to get her to tolerate the 3 layer compression. using Hydrofera Blue 10/30/17; absolutely no change in the condition of either wound although we've had some improvement in dimensions on the right.. Attempted to put her in 3 layer compression she didn't tolerated she is back in 2 layer compression. We've been using Hydrofera Blue We looked over her past records. She had venous reflux studies in November 2016. There was no evidence of deep venous reflux on the right. Superficial vein did not show the greater saphenous vein at think this is been previously ablated the small saphenous vein was within normal limits. The left deep venous system showed no DVT the vessels were positive for deep venous reflux in the posterior tibial veins at the ankle. The greater saphenous vein was surgically absent small saphenous vein was within normal limits. She went to vein and vascular at [facility]. I believe she had an ablation on the left greater saphenous vein. I'll update her  reflux studies perhaps ever reviewed by vein and vascular. We've made absolutely no progress in these wounds. Will also try to read and TheraSkins through her insurance 11/06/17; W the patient apparently has a 2 week follow-up with vein and vascular I like him to review the whole issue with regards to her previous vascular workup by Dr. Aleda Grana. We've really made no progress on these wounds in many months. She arrives today with less viable looking surface on the left medial ankle wound. This was apparently looking about the same on Tuesday when she was here for nurse visit. 11/13/17; deep tissue culture I did last time of the left lower leg showed multiple organisms without any predominating. In particular no Staphylococcus or group A strep were isolated. We sent her for venous reflux studies. She's had a previous left greater saphenous vein stripping and I think sclerotherapy of the right greater saphenous vein. She didn't really look at the lesser saphenous vein this both wounds are on the medial aspect. She has reflux in the common femoral vein and popliteal vein and an accessory vein on the right and the common femoral vein and popliteal vein on the left. I'm going to have her go to see vein and vascular just the look over things and see if anything else beside aggressive compression is indicated here. We have not been able to make any progress on these wounds in spite of the fact that the surface of the wounds is never look too bad. 11/20/17; no major change in the condition of the wounds. Patient reports a large amount of drainage. She has a lot of complaints of pain although enlisting her today I wonder if some of this at least his neuropathic rather than secondary to her wounds. She has an appointment with vein and vascular on 12/30/17. The refractory nature of these wounds in my mind at least need vein and vascular to look over the wounds the recent reflux studies we did and her history to  see if anything further can be done here. I also note her gait is deteriorated quite a bit. Looks  like she has inversion of her foot on the right. She has a bilateral Trendelenburg gait. I wonder if this is neuropathic or perhaps multilevel radicular. 11/27/17; her wounds actually looks slightly better. Healthy-looking granulation tissue a scant amount of epithelialization. [place] healthcare will not pay for [employer]. They will play for tri layer Oasis and Dermagraft. This is not a diabetic ulcer. We'll try for the tri layer Oasis. She still complains of some drainage. She has a vein and vascular appointment on 12/30/17 12/04/17; the wounds visually look quite good. Healthy-looking granulation with some degree of epithelialization. We are still waiting for response to our request for trial to try layer Oasis. Her appointment with vascular to review venous and arterial issues isn't sold the end of July 7/31. Not allow debridement or even vigorous cleansing of the wound surface. 12/18/17; slightly smaller especially on the right. 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 We've been using Hydrofera Blue. We still are looking into triple layer Oasis through her insurance 01/08/18 on evaluation today patient's wound actually appears to be showing signs of good improvement at this point in time. She has been tolerating the dressing changes without complication. Fortunately there does not appear to be any evidence of infection at this point in time. We have been utilizing silver nitrate which does seem to be of benefit for her which is also good news. Overall I'm very happy with how things seem to be both regards appearance as well as measurement. Patient did see Dr. Bridgett Larsson for evaluation on 12/30/17. In his assessment he felt that stripping would not likely add much more than chronic compression to the patient's healing process. His recommendation was to follow-up in three months with  Dr. Doren Custard if she hasn't healed in order to consider referral back to you and see vascular where she previously was in a trial and was able to get her wound to heal. I'll be see what she feels she when you staying compression and he reiterated this as well. 01/13/18 on evaluation today patient appears to actually be doing very well in regard to her bilateral medial malleolus ulcers. She seems to have tolerated the chemical cauterization with silver nitrate last week she did have some pain through that evening but fortunately states that I'll be see since it seems to be doing better she is overall pleased with the progress. 01/21/18; really quite a remarkable improvement since I've last seen these wounds. We started using silver nitrate specially on the islands of hyper granulation which for some reason her around the wound circumference. This is really done quite nicely. Primary dressing Hydrofera Blue under 4 layer compression. She seems to be able to hold out without a nurse rewrap. Follow-up in 1 week 01/28/18; we've continued the hydrofera blue but continued with chemical cauterization to the wound area that we started about a month ago for irregular hyper granulation. She is made almost stunning improvement in the overall wound dimensions. I was not really expecting this degree of improvement in these chronic wounds 02/05/18; we continue with Hydrofera Bluebut of also continued the aggressive chemical cauterization with silver nitrate. We made nice progress with the right greater than left wound. 02/12/18. We continued with Hydrofera Blue after aggressive chemical cauterization with silver nitrate. We appear to be making nice progress with both wound areas 02/19/2018; we continue with [facility] after washing the wounds vigorously with Anasept spray and chemical cauterization with silver nitrate. We are making excellent progress. The area on the  right's just about closed 02/26/2018. The area on the  left medial ankle had too much necrotic debris today. I used a #5 curette we are able to get most of the soft. I continued with the silver nitrate to the much smaller wound on the right medial ankle she had a new area on her right lower pretibial area which she says was due to a role in her compression 03/05/2018; both wound areas look healthy. Not much change in dimensions from last week. I continue to use silver nitrate and Hydrofera Blue. The patient saw Dr. Doren Custard of vein and vascular. He felt she had venous stasis ulcers. He felt based on her previous arteriogram she should have adequate circulation for healing. Also she has deep venous reflux but really no significant correctable superficial venous reflux at this time. He felt we should continue with conservative management including leg elevation and compression 04/02/2018; since we last saw this woman about a month ago she had a fall apparently suffered a pelvic fracture. I did not look up the x-ray. Nevertheless because of pain she literally was bedbound for 2 weeks and had home health coming out to change the dressing. Somewhat predictably this is resulted in considerable improvement in both wound areas. The right is just about closed on the medial malleolus and the left is about half the size. 04/16/2018; both her wounds continue to go down in size. Using Hydrofera Blue. 05/07/18; both her wounds appeared to be improving especially on the right where it is almost closed. We are using Hydrofera Blue 05/14/2018; slightly worse this week with larger wounds. Surface on the left medial not quite as good. We have been using Hydrofera Blue 05/21/18; again the wounds are slightly larger. Left medial malleolus slightly larger with eschar around the circumference. We have been using Hydrofera Blue undergoing a wraps for a prolonged period of time. This got a lot better when she was more recumbent due to a fall and a back injury. I change the  primary dressing the silver alginate today. She did not tolerate a 4 layer compression previously although I may need to bring this up with her next time 05/28/2018; area on the left medial malleolus again is slightly larger with more drainage. Area on the right is roughly unchanged. She has a small area of folliculitis on the right medial just on the lower calf. This does not look ominous. 06/03/2018 left medial malleolus slightly smaller in a better looking surface. We used silver nitrate on this last time with silver alginate. The area on the right appears slightly smaller 1/10; left medial malleolus slightly smaller. Small open area on the right. We used silver nitrate and silver alginate as of 2 weeks ago. We continue with the wound and compression. These got a lot better when she was off her feet 1/17; right medial malleolus wound is smaller. The left may be slightly smaller. Both surfaces look somewhat better. 1/24; both wounds are slightly smaller. Using silver alginate under Unna boots 1/31; both wounds appear smaller in fact the area on the right medial is just about closed. Surface eschar. We have been using silver alginate under Unna boots. The patient is less active now spends let much less time on her feet and I think this is contributed to the general improvement in the wound condition 2/7; both wounds appear smaller. I was hopeful the right medial would be closed however there there is still the same small open area. Slight amount of surface eschar  on the left the dimensions are smaller there is eschar but the wound edges appear to be free. We have been using silver alginate under Unna boot's 2/14; both wounds once again measure smaller. Circumferential eschar on the left medial. We have been using silver alginate under Unna boots with gradual improvement 2/21; the area on the right medial malleolus has healed. The area on the left is smaller. We have been using silver alginate and Unna  boots. We can discharge wrapping the right leg she has 20/30 stockings at home she will need to protect the scar tissue in this area 2/28; the area on the right medial malleolus remains closed the patient has a compression stocking. The area on the left is smaller. We have been using silver alginate and Unna boots. 3/6 the area on the right medial ankle remains closed. Good edema control noted she is using her own compression stocking. The area on the left medial ankle is smaller. We have been managing this with silver alginate and Unna boots which we will continue today. 3/13; the area on the right medial ankle remains closed and I'm declaring it healed today. When necessary the left is about the same still a healthy-looking surface but no major change and wound area. No evidence of infection and using silver alginate under unna and generally making considerable improvement 3/27 the area on the right medial ankle remains closed the area on the left is about the same as last week. Certainly not any worse we have been using silver alginate under an Unna boot 4/3; the area on the right medial ankle remains closed per the patient. We did not look at this wound. The wound on the left medial ankle is about the same surface looks healthy we have been using silver alginate under an Unna boot 4/10; area on the right medial ankle remains closed per the patient. We did not look at this wound. The wound on the left medial ankle is slightly larger. The patient complains that the [facility] caused burning pain all week. She also told us that she was a lot more active this week. Changed her back to silver alginate 4/17; right medial ankle still closed per the patient. Left medial ankle is slightly larger. Using silver alginate. She did not tolerate Hydrofera Blue on this area 4/24; right medial ankle remains closed we have not look at this. The left medial ankle continues to get larger today by about a  centimeter. We have been using silver alginate under Unna boots. She complains about 4 layer compression as an alternative. She has been up on her feet working on her garden 5/8; right medial ankle remains closed we did not look at this. The left medial ankle has increased in size about 100%. We have been using silver alginate under Unna boots. She noted increased pain this week and was not surprised that the wound is deteriorated 5/15; no major change in SA however much less erythema ( one week of doxy ocellulitis). 5/22-64 year old female returns at 1 week to the clinic for left medial ankle wound for which we have been using silver alginate under 3 layer compression She was placed on DOXY at last visit - the wound is wider at this visit. She is in 3 layer compression 5/29; change to [facility] last week. I had given her empiric doxycycline 2 weeks ago for a week. She is in 3 layer compression. She complains of a lot of pain and drainage on presentation today.  6/5; using Hydrofera Blue. I gave her doxycycline recently empirically for erythema and pain around the wound. Believe her cultures showed enterococcus which not would not have been well covered by doxycycline nevertheless the wound looks better and I don't feel specifically that the enterococcus needs to be covered. She has a new what looks like a wrap injury on her lateral left ankle. 6/12; she is using Hydrofera Blue. She has a new area on the left anterior lower tibial area. This was a wrap injury last week. 6/19; the patient is using Hydrofera Blue. She arrived with marked inflammation and erythema around the wound and tenderness. 12/01/18 on evaluation today patient appears to be doing a little bit better based on what I'm hearing from the standpoint of lassos evaluation to this as far as the overall appearance of the wound is concerned. Then sometime substandard she typically sees Dr. Dellia Nims. Nonetheless overall very pleased with the  progress that she's made up to this point. No fevers, chills, nausea, or vomiting noted at this time. 7/10; some improvement in the surface area. Aggressively debrided last week apparently. I went ahead with the debridement today although the patient does not tolerate this very well. We have been using Iodoflex. Still a fair amount of drainage 7/17; slightly smaller. Using Iodoflex. 7/24; no change from last week in terms of surface area. We have been using Iodoflex. Surface looks and continues to look somewhat better 7/31; surface area slightly smaller better looking surface. We have been using Iodoflex. This is under Unna boot compression 8/7-Patient presents at 1 week with Unna boot and Iodoflex, wound appears better 8/14-Patient presents at 1 week with Iodoflex, we use the Unna boot, wound appears to be stable better.Patient is getting Botox treatment for the inversion of the foot for tendon release, Next week 8/21; we are using Iodoflex. Unna boot. The wound is stable in terms of surface area. Under illumination there is some areas of the wound that appear to be either epithelialized or perhaps this is adherent slough at this point I was not really clear. It did not wipe off and I was reluctant to debride this today. 8/28; we are using Iodoflex in an Unna boot. Seems to be making good improvement. 9/4; using Iodoflex and wound is slightly smaller. 9/18; we are using Iodoflex with topical silver nitrate when she is here. The wound continues to be smaller 10/2; patient missed her appointment last week due to GI issues. She left and Iodoflex based dressing on for 2 weeks. Wound is about the same size about the size of a dime on the left medial lower 10/9 we have been using Iodoflex on the medial left ankle wound. She has a new superficial probable wrap injury on the dorsal left ankle 10/16; we have been using Hydrofera Blue since last week. This is on the left medial ankle 10/23; we have been  using Hydrofera Blue since 2 weeks ago. This is on the left medial ankle. Dimensions are better 11/6; using Hydrofera Blue. I think the wound is smaller but still not closed. Left medial ankle 11/13; we have been using Hydrofera Blue. Wound is certainly no smaller this week. Also the surface not as good. This is the remanent of a very large area on her left medial ankle. 11/20; using Sorbact since last week. Wound was about the same in terms of size although I was disappointed about the surface debris 12/11; 3-week follow-up. Patient was on vacation. Wound is measuring slightly larger we  have been using Sorbact. 12/18; wound is about the same size however surface looks better last week after debridement. We have been using Sorbact under compression 1/15 wound is probably twice the size of last time increased in length nonviable surface. We have been using Sorbact. She was running a mild fever and missed her appointment last week 1/22; the wound is come down in size but under illumination still a very adherent debris we have been Hydrofera Blue that I changed her to last week 1/29; dimensions down slightly. We have been using Hydrofera Blue 2/19 dimensions are the same however there is rims of epithelialization under illumination. Therefore more the surface area may be epithelialized 2/26; the patient's wound actually measures smaller. The wound looks healthy. We have been using Hydrofera Blue. I had some thoughts about running Apligraf then I still may do that however this looks so much better this week we will delay that for now 3/5; the wound is small but about the same as last week. We have been using Hydrofera Blue. No debridement is required today. 3/19; the wound is about the size of a dime. Healthy looking wound even under illumination. We have been using Hydrofera Blue. No mechanical debridement is necessary 3/26; not much change from last week although still looks very healthy. We have been  using Hydrofera Blue under Unna boots Patient was offered an ankle fusion by podiatry but not until the wound heals with a proceed with this. 4/9; the patient comes in today with her original wound on the medial ankle looking satisfactory however she has some uncontrolled swelling in the middle part of her leg with 2 new open areas superiorly just lateral to the tibia. I think this was probably a wrap issue. She said she felt uncomfortable during the week but did not call in. We have been using Hydrofera Blue 4/16; the wound on the medial ankle is about the same. She has innumerable small areas superior to this across her mid tibia. I think this is probably folliculitis. She is also been working in the yard doing a lot of sweating 4/30; the patient issue on the upper areas across her mid tibia of all healed. I think this was excessive yard work if I remember. Her wound on the medial ankle is smaller. Some debris on this we have been using Hydrofera Blue under Unna boots 5/7; mid tibia. She has been using Hydrofera Blue under an Unna wrap. She is apparently going for her ankle surgery on June 3 10/28/19-Patient returns to clinic with the ankle wound, we are using Hydrofera Blue under Unna wrap, surgery is scheduled for her left foot for June 3 so she will be back for nurse visit next week READMISSION 01/17/2020 [name] is a 64 year old woman we have had in this clinic for a long period of time with severe venous hypertension and refractory wounds on her medial lower legs and ankles bilaterally. 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 When she was off her legs for a prolonged period example when she fell and suffered a compression fracture things would heal up quite nicely. She is now retired and we managed to heal up the right medial leg wound. The left one was very tiny  last time I saw this although still refractory. 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 It got to the point where this was interfering with her walking and she elected to proceed with a ankle arthrodesis to straighten her her ankle and leave her with a functional outcome for mobilization. The patient was referred to Dr. Doren Custard and really this took some time to arrange. Dr. Doren Custard saw her on 12/07/2019. Once again he verified that she had no arterial issues. She had previously had an angiogram several years ago. Follow-up ABIs on the left showed an ABI of 1.12 with triphasic waveforms and a TBI of 0.92. She is felt to have chronic deep venous insufficiency but I do not think it was felt that anything could be done from about this from an ablation point of view. At the time Dr. Doren Custard saw this patient the wounds actually look closed via the pictures in his clinic. The patient finally underwent her surgery on 12/15/2019. This went reasonably well and there was a good anatomic outcome. She developed a small distal wound dehiscence on the lateral part of the surgical wound. However more problematically she is developed recurrence of the wound on the medial left ankle. There are actually 2 wounds here one in the distal lower leg and 1 pretty much at the level of the medial malleolus. It is a more distal area that is more problematic. She has been using Hydrofera Blue which started on Friday before this she was simply Ace wrapping. There was a culture done that showed Pseudomonas and she is on ciprofloxacin. A recent CNS on 8/11 was negative. The patient reports some pain but I generally think this is improving. She is using a cam boot completely nonweightbearing using a walker for pivot transfers and a wheelchair 8/24; not much improvement unfortunately she has a surgical wound on the lateral part in the venous  insufficiency wound medially. The bottom part of the medial insufficiency wound is still necrotic there is exposed tendon here. We have been using Hydrofera Blue under compression. Her edema control is however better 8/31; patient in for follow-up of his surgical wound on the lateral part of her left leg and chronic venous insufficiency ulcers medially. We put her back in compression last week. She comes in today with a complaint of 3 or 4 days worth of increasing pain. She felt her cam walker was rubbing on the area on the back of her heel. However there is intense erythema seems more likely she has cellulitis. She had 2 cultures done when she was seeing podiatry in the postop. One of them in late July showed Pseudomonas and she received a course of ciprofloxacin the other was negative on 8/11 she is allergic to penicillin with anaphylactoid complaints of hives oral swelling via information in epic 9/9; when I saw this patient last week she had intense anterior erythema around her wound on the right lateral heel and ankle and also into the right medial heel. Some of this was no doubt drainage and her walker boot however I was convinced she had cellulitis. I gave her Levaquin and Bactrim she is finishing up on this now. She is following up with Dr. [name] he saw her yesterday. He is taken her out of the walking boot of course she is still nonweightbearing. Her x-ray was negative for any worrisome features such as soft tissue air etc. Things are a lot better this week. She has home health. We have been using Hydrofera Blue under an [employer] which she put  back on yesterday. I did not wrap her last week 9/17; her surrounding skin looks a lot better. In fact the area on the left lateral ankle has just a scant amount of eschar. The only remaining wound is the large area on the left medial ankle. Probably about 60% of this is healthy granulation at the surface however she has a significant divot distally.  This has adherent debris in it. I been using debridement and silver collagen to try and get this area to fill-in although I do not think we have made much progress this week 9/24; the patient's wound on the left medial ankle looks a lot better. The deeper divot area distally still requires debridement but this is cleaning up quite nicely we have been using silver collagen. The patient is complaining of swelling in her foot and is worried that that is contributing to the nonhealing of the ankle wound. She is also complaining of numbness in her anterior toes 10/4; left medial ankle. The small area distally still has a divot with necrotic material that I have been debriding away. This has an undermining area. She is approved for Apligraf. She saw Dr. [name] her surgeon on 10/1. I think he declared himself is satisfied with the condition of things. Still nonweightbearing till the end of the month. 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 Her surgical wound is well closed. There is no evidence of infection 10/11; the patient arrived in clinic today with the expectation that we be able to put an Apligraf on this area after debridement however she arrives with a relatively large amount of green drainage on the dressing. The patient states that this started on Friday. She has not been systemically unwell. 10/19; culture I did last week showed both Enterococcus and Pseudomonas. I think this came in separate parts because I stopped her ciprofloxacin I gave her and prescribed her linezolid however now looking at the final culture result this is Pseudomonas which is resistant to quinolones. She has not yet picked up the linezolid apparently phone issues. We are also trying to get a topical antibiotic out of [place] in [place] they can be applied by home health. She is still having green drainage 10/16; the patient has her topical antibiotic from [facility] in  [place]. This is a compounded gel with vancomycin and ciprofloxacin and gentamicin. We are applying this on the wound bed with silver alginate over the top with Unna boot wraps. She arrives in clinic today with a lot less ominous looking drainage although she is only use this topical preparation once the second time today. She sees Dr. [name] her surgeon on Friday she has home health changing the dressing 11/2; still using her compounded topical antibiotic under silver alginate. Surface is cleaning up there is less drainage. We had an Apligraf for her today and I elected to apply it. A light coating of her antibiotic 04/25/2020 upon evaluation today patient appears to be doing well with regard to her ankle ulcer. There is a little bit of slough noted on the surface of the wound I am can have to perform sharp debridement to clear this away today. With that being said other than that fact overall I feel like she is making progress and we do see some new epithelial growth. There is also some improvement in the depth of the wound and that distal portion. There is little bit of slough there as well. Electronic Signature(s) Signed: 04/25/2020 10:28:32  AM By: [name] PA-C Entered By: [name] on 04/25/2020 10:28:32 -------------------------------------------------------------------------------- Physical Exam Details Patient Name: Date of Service: [facility] [name], [name] 04/25/2020 9:30 A M Medical Record Number: [id number] Patient Account Number: [id number] Date of Birth/Sex: Treating RN: 11-06-1955 (64 y.o. [name] Primary Care Provider: [name] Other Clinician: Referring Provider: Treating Provider/Extender: [name] in Treatment: 67 Constitutional Well-nourished and well-hydrated in no acute distress. Respiratory normal breathing without difficulty. Psychiatric this patient is able to make decisions and demonstrates good insight into  disease process. Alert and Oriented x 3. pleasant and cooperative. Notes Upon inspection patient's wound bed did require sharp debridement to clear away some of the slough and necrotic debris from the surface of the wound. The patient tolerated that today without complication post debridement wound bed appears to be doing much better which is great news. I then proceeded to apply some of the [facility] pharmacy gel mixture for her antibiotic compounded gel. I did this in a thin film and then subsequently applied the Apligraf over top then using a piece of drawtex to hold the Apligraf down in place applying the Adaptic over top and securing this with Steri-Strips. The patient tolerated all this today with minimal discomfort which is great news. Electronic Signature(s) Signed: 04/25/2020 10:29:13 AM By: [name] PA-C Entered By: [name] on 04/25/2020 10:29:12 -------------------------------------------------------------------------------- Physician Orders Details Patient Name: Date of Service: [facility] [name], [name] 04/25/2020 9:30 A M Medical Record Number: [id number] Patient Account Number: [id number] Date of Birth/Sex: Treating RN: 11-13-1955 (64 y.o. 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 - Tuesday with Dr. Dellia Nims for apligraf #3 Return A Dressing Change Frequency Wound #15 Left,Medial Malleolus Do not change entire dressing for one week. - Nov. 30  Tues patient to be seen by home health, outer dressing change only , DO NOT remove adaptic or steri strips Skin Barriers/Peri-Wound Care Moisturizing lotion - to leg Wound Cleansing May shower with protection. - use cast protector Primary Wound Dressing Wound #15 Left,Medial Malleolus pplication - APLI Graft #2 , apply thin layer of keystone gel , mix as ordered Skin Substitute A daptic - secured with steristrips Mepitel or A Secondary Dressing Wound #15 Left,Medial Malleolus Dry Gauze ABD pad Drawtex Edema Control Unna Boot to Left Lower Extremity Avoid standing for long periods of time Elevate legs to the level of the heart or above for 30 minutes daily and/or when sitting, a frequency of: - throughout the day Off-Loading Other: - ensure no pressure to wound areas. [place] skilled nursing for wound care. - [facility] to see on Tues Nov 30 for unna and secondary dressing change only Patient Medications llergies: penicillin, doxycycline A Notifications Medication Indication Start End prior to debridement 04/25/2020 lidocaine DOSE topical 5 % ointment - ointment topical Electronic Signature(s) Signed: 04/25/2020 4:58:27 PM By: Baruch Gouty RN, BSN Signed: 04/25/2020 5:04:05 PM By: [name] PA-C Entered By: Baruch Gouty on 04/25/2020 10:22:45 -------------------------------------------------------------------------------- Problem List Details Patient Name: Date  of Service: [name] [name], [name] 04/25/2020 9:30 A M Medical Record Number: [id number] Patient Account Number: [id number] Date of Birth/Sex: Treating RN: 1955-11-16 (64 y.o. 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 01/17/2020 No Yes Inactive Problems ICD-10 Code Description Active Date Inactive Date L03.116 Cellulitis of left lower limb 01/31/2020 01/31/2020 T81.31XD Disruption of external operation (surgical) wound, not elsewhere classified, subsequent 01/17/2020 01/17/2020 encounter Resolved Problems Electronic Signature(s) Signed: 04/25/2020 9:44:46 AM By: [name] PA-C Entered By: [name] on 04/25/2020 09:44:46 -------------------------------------------------------------------------------- Progress Note Details Patient Name: Date of Service: [facility] [name], [name] 04/25/2020 9:30 A M Medical Record Number: [id number] Patient Account Number: [id number] Date of Birth/Sex: Treating RN: 29-Mar-1956 (64 y.o. [name] Primary Care Provider: [name] Other Clinician: Referring Provider: Treating Provider/Extender: [name] in Treatment: 14 Subjective Chief Complaint Information obtained from Patient patient is been followed long-term in this clinic for venous insufficiency ulcers with inflammation, hypertension and ulceration over the medial ankle bilaterally. 01/17/2020; this is a patient who is here for review of postoperative wounds on the left lateral ankle and recurrence of venous stasis ulceration on the left medial History of Present Illness (HPI) the remaining wound is over the left medial ankle. Similar wound over the right medial ankle healed largely with use of Apligraf. Most recently we have been using Hydrofera Blue over this wound with considerable improvement. The patient has been extensively worked up in the past for her venous insufficiency and she is not a candidate for  antireflux surgery although I have none of the details available currently. 08/24/14; considerable improvement today. About 50% of this wound areas now epithelialized. The base of the wound appears to be healthier granulation.as opposed to last week when she had deteriorated a considerable improvement 08/17/14; unfortunately the wound has regressed somewhat. The areas of epithelialization from the superior aspect are not nearly as healthy as they were last week. The patient thinks her Hydrofera Blue slipped. 09/07/14; unfortunately the area has markedly regressed in the 2 weeks since I've seen this. There is an odor surrounding erythema. The healthy granulation tissue that we had at the base of the wound now is a dusky color. The nurse reports green drainage 09/14/14; the area looks somewhat better than last week. There is less erythema and less drainage. The culture I did did not show any growth. Nevertheless I think it is better to continue the Cipro and doxycycline for a further week. The remaining wound area was debridement. 09/21/14. Wound did not require debridement last week. Still less erythema and less drainage. She can complete her antibiotics. The areas of epithelialization in the superior aspect of the wound do not look as healthy as they did some weeks ago 10/05/14 continued improvement in the condition of this wound. There is advancing epithelialization. Less aggressive debridement required 10/19/14 continued improvement in the condition and volume of this wound. Less aggressive debridement to the inferior part of this to remove surface slough and fibrinous eschar 11/02/14 no debridement is required. The surface granulation  appears healthy although some of her islands of epithelialization seem to have regressed. No evidence of infection 11/16/14; lites surface debridement done of surface eschar. The wound does not look to be unhealthy. No evidence of infection. Unfortunately the patient has had  podiatry issues in the right foot and for some reason has redeveloped small surface ulcerations in the medial right ankle. Her original presentation involved wounds in this area 11/23/14 no debridement. The area on the right ankle has enlarged. The left ankle wound appears stable in terms of the surface although there is periwound inflammation. There has been regression in the amount of new skin 11/30/14 no debridement. Both wound areas appear healthy. There was no evidence of infection. The the new area on the right medial ankle has enlarged although that both the surfaces appear to be stable. 12/07/14; Debridement of the right medial ankle wound. No no debridement was done on the left. 12/14/14 no major change in and now bilateral medial ankle wounds. Both of these are very painful but the no overt evidence of infection. She has had previous venous ablation 12/21/14; patient states that her right medial ankle wound is considerably more painful last week than usual. Her left is also somewhat painful. She could not tolerate debridement. The right medial ankle wound has fibrinous surface eschar 12/28/14 this is a patient with severe bilateral venous insufficiency ulcers. For a considerable period of time we actually had the one on the right medial ankle healed however this recently opened up again in June. The left medial ankle wound has been a refractory area with some absent flows. We had some success with Hydrofera Blue on this area and it literally closed by 50% however it is recently opened up Foley. Both of these were debridement today of surface eschar. She tolerates this poorly 01/25/15: No change in the status of this. Thick adherent escar. Very poor tolerance of any attempt at debridement. I had healed the right medial malleolus wound for a considerable amount of time and had the left one down to about 50% of the volume although this is totally regressed over the last 48 weeks. Further the right  leg has reopened. she is trying to make a appointment with pain and vascular, previous ablations with Dr. Aleda Grana. I do not believe there is an arterial insufficiency issue here 02/01/15 the status of the adherent eschar bilaterally is actually improved. No debridement was done. She did not manage to get vascular studies done 02/08/15 continued debridement of the area was done today. The slough is less adherent and comes off with less pressure. There is no surrounding infection peripheral pulses are intact 02/15/15 selective debridement with a disposable curette. Again the slough is less adherent and comes off with less difficulty. No surrounding infection peripheral pulses are intact. 02/22/15 selective debridement of the right medial ankle wound. Slough comes off with less difficulty. No obvious surrounding infection peripheral pulses are intact I did not debridement the one on the left. Both of these are stable to improved 03/01/15 selective debridement of both wound areas using a curette to. Adherent slough cup soft with less difficulty. No obvious surrounding infection. The patient tells me that 2 days ago she noted a rash above the right leg wrap. She did not have this on her lower legs when she change this over she arrives with widespread left greater than right almost folliculitis-looking rash which is extremely pruritic. I don't see anything to culture here. There is no rash on the rest  of her body. She feels well systemically. 03/08/15; selective debridement of both wounds using a curette. Base of this does not look unhealthy. She had limegreen drainage coming out of the left leg wound and describes a lot of drainage. The rash on her left leg looks improved to. No cultures were done. 03/22/15; patient was not here last week. Basal wounds does not look healthy and there is no surrounding erythema. No drainage. There is still a rash on the left leg that almost looks vasculitic however it is  clearly limited to the top of where the wrap would be. 04/05/15; on the right required a surgical debridement of surface eschar and necrotic subcutaneous tissue. I did not debridement the area on the left. These continue to be large open wounds that are not changing that much. We were successful at one point in healing the area on the right, and at the same time the area on the left was roughly half the size of current measurements. I think a lot of the deterioration has to do with the prolonged time the patient is on her feet at work 04/19/15 I attempted-like surface debridement bilaterally she does not tolerate this. She tells me that she was in allergic care yesterday with extreme pain over her left lateral malleolus/ankle and was told that she has an "sprain" 05/03/15; large bilateral venous insufficiency wounds over the medial malleolus/medial aspect of her ankles. She complains of copious amounts of drainage and his usual large amounts of pain. There is some increasing erythema around the wound on the right extending into the medial aspect of her foot to. historically she came in with these wounds the right one healed and the left one came down to roughly half its current size however the right one is reopened and the left is expanded. 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 05/10/15 large bilateral venous insufficiency wounds. There is less adherence surface left however the surface culture that I did last week grew pseudomonas therefore bilateral selective debridement score necessary. There is surrounding erythema. The patient describes severe bilateral drainage and a lot of pain in the left ankle. Apparently her podiatrist was were ready to do a cortisone shot 05/17/15; the patient complains of pain and again copious amounts of drainage. 05/24/15; we used Iodo flex last week. Patient notes considerable improvement in wound drainage. Only  needed to change this once. 05/31/15; we continued Iodoflex; the base of these large wounds bilaterally is not too bad but there is probably likely a significant bioburden here. I would like to debridement just doesn't tolerate it. 06/06/14 I would like to continue the Iodoflex although she still hasn't managed to obtain supplies. She has bilateral medial malleoli or large wounds which are mostly superficial. Both of them are covered circumferentially with some nonviable fibrinous slough although she tolerates debridement very poorly. She apparently has an appointment for an ablation on the right leg by interventional radiology. 06/14/15; the patient arrives with the wounds and static condition. We attempted a debridement although she does not do well with this secondary to pain. I 07/05/15; wounds are not much smaller however there appears to be a cleaner granulating base. The left has tight fibrinous slough greater than the right. Debridement is tolerated poorly due to pain. Iodoflex is done more for these wounds in any of the multitude of different dressings I have tried on the left 1 and then subsequently  the right. 07/12/15; no change in the condition of this wound. I am able to do an aggressive debridement on the right but not the left. She simply cannot tolerate it. We have been using Iodoflex which helps somewhat. It is worthwhile remembering that at one point we healed the right medial ankle wound and the left was about 25% of the current circumference. We have suggested returning to vascular surgery for review of possible further ablations for one reason or another she has not been able to do this. 07/26/15 no major change in the condition of either wound on her medial ankle. I did not attempt to debridement of these. She has been aggressively scrubbing these while she is in the shower at home. She has her supply of Iodoflex which seems to have done more for these wounds then anything I have put  on recently. 08/09/15 wound area appears larger although not verified by measurements. Using Iodoflex 09/05/2015 -- she was here for avisit today but had significant problems with the wound and I was asked to see her for a physician opinion. I have summarize that this lady has had surgery on her left lower extremity about 10 years ago where the possible veins stripping was done. She has had an opinion from interventional radiology around November 2016 where no further sclerotherapy was ordered. The patient works 12 hours a day and stands on a concrete floor with work boots and is unable to get the proper compression she requires and cannot elevate her limbs appropriately at any given time. She has recently grown Pseudomonas from her wound culture but has not started her ciprofloxacin which was called in for her. 09/13/15 this continues to be a difficult situation for this patient. At one point I had this wound down to a 1.5 x 1.5" wound on her left leg. This is deteriorated and the right leg has reopened. She now has substantial wounds on her medial calcaneus, malleoli and into her lower leg. One on the left has surface eschar but these are far too painful for me to debridement here. She has a vascular surgery appointment next week to see if anything can be done to help here. I think she has had previous ablations several years ago at [facility]. She has no major edema. She tells me that she did not get product last time [facility]] and went for several days without it. She continues to work in work boots 12 hours a day. She cannot get compression/4-layer under her work boots. 09/20/15 no major change. Periwound edema control was not very good. Her point with pain and vascular is next Wednesday the 25th 09/28/15; the patient is seen vascular surgery and is apparently scheduled for repeat duplex ultrasounds of her bilateral lower legs next week. 10/05/15; the patient was seen by Dr. Doren Custard of vascular  surgery. He feels that she should have arterial insufficiency excluded as cause/contributed to her nonhealing stage she is therefore booked for an arteriogram. She has apparently monophasic signals in the dorsalis pedis pulses. She also of course has known severe chronic venous insufficiency with previous procedures as noted previously. I had another long discussion with the patient today about her continuing to work 12 hour shifts. I've written her out for 2 months area had concerns about this as her work location is currently undergoing significant turmoil and this may lead to her termination. She is aware of this however I agree with her that she simply cannot continue to stand for 12  hours multiple days a week with the substantial wound areas she has. 10/19/15; the Dr. Doren Custard appointment was largely for an arteriogram which was normal. She does not have an arterial issue. He didn't make a comment about her chronic venous insufficiency for which she has had previous ablations. Presumably it was not felt that anything additional could be done. The patient is now out of work as I prescribed 2 weeks ago. Her wounds look somewhat less aggravated presumably because of this. I felt I would give debridement another try today 10/25/15; no major change in this patient's wounds. We are struggling to get her product that she can afford into her own home through her insurance. 11/01/15; no major change in the patient's wounds. I have been using silver alginate as the most affordable product. I spoke to Dr. Marla Roe last week with her requested take her to the OR for surgical debridement and placement of ACEL. Dr. Marla Roe told me that she would be willing to do this however [facility] will not cover this, fortunately the patient has [place] healthcare of some variant 11/08/15; no major change in the patient's wounds. She has been completely nonviable surface that this but is in too much pain with any  attempted debridement are clinic. I have arranged for her to see Dr. Marla Roe ham of plastic surgery and this appointment is on Monday. I am hopeful that they will take her to the OR for debridement, possible ACEL ultimately possible skin graft 11/22/15 no major change in the patient's wounds over her bilateral medial calcaneus medial malleolus into the lower legs. Surface on these does not look too bad however on the left there is surrounding erythema and tenderness. This may be cellulitis or could him sleepy tinea. 11/29/15; no major changes in the patient's wounds over her bilateral medial malleolus. There is no infection here and I don't think any additional antibiotics are necessary. There is now plan to move forward. She sees Dr. Marla Roe in a week's time for preparation for operative debridement and ACEL placement I believe on 7/12. She then has a follow-up appointment with Dr. Marla Roe on 7/21 12/28/15; the patient returns today having been taken to the [facility] by Dr. Marla Roe 12/12/15 she underwent debridement, intraoperative cultures [which were negative]. She had placement of a wound VAC. Parent really ACEL was not available to be placed. The wound VAC foam apparently adhered to the wound since then she's been using silver alginate, Xeroform under Ace wraps. She still says there is a lot of drainage and a lot of pain 01/31/16; this is a patient I see monthly. I had referred her to Dr. Marla Roe him of plastic surgery for large wounds on her bilateral medial ankles. She has been to the OR twice once in early July and once in early August. She tells me over the last 3 weeks she has been using the wound VAC with ACEL underneath it. On the right we've simply been using silver alginate. Under Kerlix Coban wraps. 02/28/16; this is a patient I'm currently seeing monthly. She is gone on to have a skin graft over her large venous insufficiency ulcer on the left medial ankle. This was done by Dr.  Marla Roe him. The patient is a bit perturbed about why she didn't have one on her right medial ankle wound. She has been using silver alginate to this. 03/06/16; I received a phone call from her plastic surgery Dr. Marla Roe. She expressed some concern about the viability of the skin  graft she did on the left medial ankle wound. Asked me to place Endoform on this. She told me she is not planning to do a subsequent skin graft on the right as the left one did not take very well. I had placed Hydrofera Blue on the right 03/13/16; continue to have a reasonably healthy wound on the right medial ankle. Down to 3 mm in terms of size. There is epithelialization here. The area on the left medial ankle is her skin graft site. I suppose the last week this looks somewhat better. She has an open area inferiorly however in the center there appears to be some viable tissue. There is a lot of surface callus and eschar that will eventually need to come off however none of this looked to be infected. Patient states that the is able to keep the dressing on for several days which is an improvement. 03/20/16 no major change in the circumference of either wound however on the left side the patient was at Dr. Eusebio Friendly office and they did a debridement of left wound. 50% of the wound seems to be epithelialized. I been using Endoform on the left Hydrofera Blue in the right 03/27/16; she arrives today with her wound is not looking as healthy as they did last week. The area on the right clearly has an adherent surface to this a very similar surface on the left. Unfortunately for this patient this is all too familiar problem. Clearly the Endoform is not working and will need to change that today that has some potential to help this surface. She does not tolerate debridement in this clinic very well. She is changing the dressing wants 04/03/16; patient arrives with the wounds looking somewhat better especially on the right.  Dr. Migdalia Dk change the dressing to silver alginate when she saw her on Monday and also sold her some compression socks. The usefulness of the latter is really not clear and woman with severely draining wounds. 04/10/16; the patient is doing a bit of an experiment wearing the compression stockings that Dr. Migdalia Dk provided her to her left leg and the out of legs based dressings that we provided to the right. 05/01/16; the patient is continuing to wear compression stockings Dr. Migdalia Dk provided her on the left that are apparently silver impregnated. She has been using Iodoflex to the right leg wound. Still a moderate amount of drainage, when she leaves here the wraps only last for 4 days. She has to change the stocking on the left leg every night 05/15/16; she is now using compression stockings bilaterally provided by Dr. Marla Roe. She is wearing a nonadherent layer over the wounds so really I don't think there is anything specific being done to this now. She has some reduction on the left wound. The right is stable. I think all healing here is being done without a specific dressing 06/09/16; patient arrives here today with not much change in the wound certainly in diameter to large circular wounds over the medial aspect of her ankle bilaterally. Under the light of these services are certainly not viable for healing. There is no evidence of surrounding infection. She is wearing compression stockings with some sort of silver impregnation as prescribed by Dr. Marla Roe. She has a follow-up with her tomorrow. 06/30/16; no major change in the size or condition of her wounds. These are still probably covered with a nonviable surface. She is using only her purchase stockings. She did see Dr. Marla Roe who seemed to want to apply  Dakin's solution to this I'm not extreme short what value this would be. I would suggest Iodoflex which she still has at home. 07/28/16; I follow Mrs. Calamari episodically along with Dr.  Marla Roe. 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 She has been applying a topical collagen ointment to both wounds with Adaptic. I don't think Dr. Marla Roe is planning to take her back to the OR. 08/19/16; I follow Mrs. Jeneen Rinks on a monthly basis along with Dr. Marla Roe of plastic surgery. 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 I been following her for a number of years. At one point I was able to get the right medial malleolus wound to heal and had the left medial malleolus down to about half its current size however and I had to send her to plastic surgery for an operative debridement. Since then things have been stable to slightly improve the area on the right is slightly better one in the left about the same although there is much less adherent surface than I'm used to with this patient. She is using some form of liquid collagen gel that Dr. Marla Roe provided a Kerlix cover with the patient's own pressure stockings. She tells me that she has extreme pain in both ankles and along the lateral aspect of both feet. She has been unable to work for some period of time. She is telling me she is retiring at the beginning of April. She sees Dr. Doran Durand of orthopedics next week 09/22/16; patient has not seen Dr. Marla Roe since the last time she is here. I'm not really sure what she is using to the wounds other than bits and pieces of think she had left over including most recently Hydrofera Blue. She is using juxtalite stockings. She is having difficulty with her husband's recent illness "stroke". She is having to transport him to various doctors appointments. Dr. Marla Roe left her the option of a repeat debridement with ACEL however she has not been able to get the time to follow-up on this. She continues to have a fair amount of drainage out of these wounds with  certainly precludes leaving dressings on all week 10/13/16; patient has not seen Dr. Marla Roe since she was last in our clinic. I'm not really sure what she is doing with the wounds, we did try to get her [facility] and I think she is actually using this most of the time. Because of drainage she states she has to change this every second day although this is an improvement from what she used to do. She went to see Dr. Doran Durand who did not think she had a muscular issue with regards to her feet, he referred her to a neurologist and I think the appointment is sometime in June. I changed her back to Iodoflex which she has used in the past but not recently. 11/03/16; the patient has been using Iodoflex although she ran out of this. Still claims that there is a lot of drainage although the wound does not look like this. No surrounding erythema. She has not been back to see Dr. Marla Roe 11/24/16; the patient has been using Iodoflex again but she ran out of it 2 or 3 days ago. There is no major change in the condition of either one of these wounds in fact they are larger and covered in a thick adherent surface slough/nonviable tissue especially on the left. She does  not tolerate mechanical debridement in our clinic. Going back to see Dr. Marla Roe of plastic surgery for an operative debridement would seem reasonable. 12/15/16; the patient has not been back to see Dr. Marla Roe. She is been dealing with a series of illnesses and her husband which of monopolized her time. She is been using Sorbact which we largely supplied. She states the drainage is bad enough that it maximum she can go 2-3 days without changing the dressing 01/12/2017 -- the patient has not been back for about 4 weeks and has not seen Dr. Marla Roe not does she have any appointment pending. 01/23/17; patient has not seen Dr. Marla Roe even though I suggested this previously. She is using Santyl that was suggested last week by Dr. Con Memos  this Cost her $16 through her insurance which is indeed surprising 02/12/17; continuing Santyl and the patient is changing this daily. A lot of drainage. She has not been back to see plastic surgery she is using an Ace wrap. Our intake nurse suggested wrap around stockings which would make a good reasonable alternative 02/26/17; patient is been using Santyl and changing this daily due to drainage. She has not been to see plastic surgery she uses in April Ace wrap to control the edema. She did obtain extremitease stockings but stated that the edema in her leg was to big for these 03/20/17; patient is using Santyl and Anasept. Surfaces looked better today the area on the right is actually measuring a little smaller. She has states she has a lot of pain in her feet and ankles and is asking for a consult to pain control which I'll try to help her with through our case manager. 04/10/17; the patient arrives with better-looking wound surfaces and is slightly smaller wound on the left she is using a combination of Santyl and Anasept. She has an appointment or at least as started in the pain control center associated with [facility] 05/14/17; this is a patient who I followed for a prolonged period of time. 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 At one point I had this down to a much smaller wound on the left however these reopened and we've never been able to get these to heal. She has been using Santyl and Anasept gel although 2 weeks ago she ran out of the Anasept gel. She has a stable appearance of the wound. She is going to the wound care clinic at [facility]. They wanted do a nerve block/spinal block although she tells me she is reluctant to go forward with that. 05/21/17; this is a patient I have followed for many years. 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 Chronic pain and deformity in her ankles as well. She is been to see plastic  surgery as well as orthopedics. Using PolyMem AG most recently/Kerramax/ABDs and 2 layer compression. She has managed to keep this on and she is coming in for a nurse check to change the dressing on Tuesdays, we see her on Fridays 06/05/17; really quite a good looking surface and the area especially on the right medial has contracted in terms of dimensions. Well granulated healthy-looking tissue on both sides. Even with an open curet there is nothing that even feels abnormal here. This is as good as I've seen this in quite some time. We have been using PolyMem AG and bringing her in for a nurse check 06/12/17; really quite good surface on both of these wounds. The right medial has  contracted a bit left is not. We've been using PolyMem and AG and she is coming in for a nurse visit 06/19/17; we have been using PolyMem AG and bringing her in for a nurse check. Dimensions of her wounds are not better but the surfaces looked better bilaterally. She complained of bleeding last night and the left wound and increasing pain bilaterally. She states her wound pain is more neuropathic than just the wounds. There was some suggestion that this was radicular from her pain management doctor in talking to her it is really difficult to sort this out. 06/26/17; using PolyMem and AG and bringing her in for a nurse check as All of this and reasonably stable condition. Certainly not improved. The dimensions on the lateral part of the right leg look better but not really measuring better. The medial aspect on the left is about the same. 07/03/16; we have been using PolyMen AG and bringing her in for a nurse check to change the dressings as the wounds have drainage which precludes once weekly changing. We are using all secondary absorptive dressings.our intake nurse is brought up the idea of using a wound VAC/snap VAC on the wound to help with the drainage to see if this would result in some contraction. This is not a bad idea. The  area on the right medial is actually looking smaller. Both wounds have a reasonable-looking surface. There is no evidence of cellulitis. The edema is well controlled 07/10/17; the patient was denied for a snap VAC by her insurance. The major issue with these wounds continues to be drainage. We are using wicked PolyMem AG and she is coming in for a nurse visit to change this. The wounds are stable to slightly improved. The surface looks vibrant and the area on the right certainly has shrunk in size but very slowly 07/17/17; the patient still has large wounds on her bilateral medial malleoli. Surface of both of these wounds looks better. The dimensions seem to come and go but no consistent improvement. There is no epithelialization. We do not have options for advanced treatment products due to insurance issues. They did not approve of the wound VAC to help control the drainage. More recently we've been using PolyMem and AG wicked to allow drainage through. We have been bringing her in for a nurse visit to change this. We do not have a lot of options for wound care products and the home again due to insurance issues 07/24/17; the patient's wound actually looks somewhat better today. No drainage measurements are smaller still healthy-looking surface. We used silver collagen under PolyMen started last week. We have been bringing her in for a dressing change 07/31/17; patient's wound surface continued to look better and I think there is visible change in the dimensions of the wound on the right. Rims of epithelialization. We have been using silver collagen under PolyMen and bringing her in for a dressing change. There appears to be less drainage although she is still in need of the dressing change 08/07/17. Patient's wound surface continues to look better on both sides and the area on the right is definitely smaller. We have been using silver collagen and PolyMen. She feels that the drainage has been it has been  better. I asked her about her vascular status. She went to see Dr. Aleda Grana at [facility] and had some form of ablation. I don't have much detail on this. I haven't my notes from 2016 that she was not a candidate for  any further ablation but I don't have any more information on this. We had referred her to vein and vascular I don't think she ever went. He does not have a history of PAD although I don't have any information on this either. We don't even have ABIs in our record 08/14/17; we've been using silver collagen and PolyMen cover. And putting the patient and compression. She we are bringing her in as a nurse visit to change this because ofarge amount of drainage. We didn't the ABIs in clinic today since they had been done in many moons 1.2 bilaterally. She has been to see vein and vascular however this was at [facility] and she had ablation although I really don't have any information on this all seemed biking get a report. She is also been operatively debrided by plastic surgery and had a cell placed probably 8-12 months ago. This didn't have a major effect. We've been making some gains with current dressings 08/19/17-She is here in follow-up evaluation for bilateral medial malleoli ulcers. She continues to tolerate debridement very poorly. We will continue with recently changed topical treatment; if no significant improvement may consider switching to Iodosorb/Iodoflex. 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 She has been using silver collagen and PolyMem. I believe she has been used and tried on Iodoflex before. 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 These have been very refractory over time. We have been using silver collagen and PolyMen.  She is coming in once a week for a doctor's and once a week for nurse visits. We are actually making some progress 09/18/17; the patient's wounds are smaller especially on the right medial. She arrives today to upset to consider even washing these off with Anasept which I think is been part of the reason this is been closing. We've been using collagen covered in PolyMen otherwise. It is noted that she has a small area of folliculitis on the right medial calf that. As we are wrapping her legs I'll give her a short course of doxycycline to make sure this doesn't amount to anything. She is a long list of complaints today including imbalance, shortness of breath on exertion, inversion of her left ankle. With regards to the latter complaints she is been to see orthopedics and they offered her a tendon release surgery I believe but wanted her wounds to be closed first. I have recommended she go see her primary physician with regards to everything else. 09/25/17; patient's wounds are about the same size. We have made some progress bilaterally although not in recent weeks. She will not allow me T wash these wounds with Anasept even if she is doing her cell. Wheeze we've been using collagen covered in PolyMen. Last week she had a small area of folliculitis this is now opened into a small wound. She completed 5 days of trimethoprim sulfamethoxazole 10/02/17; unfortunately the area on her left medial ankle is worse with a larger wound area towards the Achilles. The patient complains of a lot of pain. She will not allow debridement although visually I don't think there is anything to debridement in any case. We have been using silver collagen and PolyMen for several months now. Initially we are making some progress although I'm not really seeing that today. We will  move back to [facility]. His admittedly this is a bit of a repeat however I'm hoping that his situation is different now. The patient tells me she had  her leg on the left give out on her yesterday this is process some pain. 10/09/17; the patient is seen twice a week largely because of drainage issues coming out of the chronic medial bimalleolar wounds that are chronic. Last week the dimensions of the one on the left looks a little larger I changed her to [facility]. She comes in today with a history of terrible pain in the bilateral wound areas. She will not allow debridement. She will not even allow a tissue culture. There is no surrounding erythema no no evidence of cellulitis. We have been putting her Kerlix Coban man. She will not allow more aggressive compression as there was a suggestion to put her in 3 layer wraps. 10/16/17; large wounds on her bilateral medial malleoli. These are chronic. Not much change from last week. The surface looks have healthy but absolutely no epithelialization. A lot of pain little less so of drainage. She will not allow debridement or even washing these off in the vigorous fashion with Anasept. 10/23/17; large wounds on her bilateral malleoli which are chronic. Some improvement in terms of size perhaps on the right since last time I saw these. She states that after we increased the 3 layer compression there was some bleeding, when she came in for a nurse visit she did not want 3 layer compression put back on about our nurse managed to convince her. She has known chronic venous visit issues and I'm hoping to get her to tolerate the 3 layer compression. using Hydrofera Blue 10/30/17; absolutely no change in the condition of either wound although we've had some improvement in dimensions on the right.. Attempted to put her in 3 layer compression she didn't tolerated she is back in 2 layer compression. We've been using Hydrofera Blue We looked over her past records. She had venous reflux studies in November 2016. There was no evidence of deep venous reflux on the right. Superficial vein did not show the greater saphenous  vein at think this is been previously ablated the small saphenous vein was within normal limits. The left deep venous system showed no DVT the vessels were positive for deep venous reflux in the posterior tibial veins at the ankle. The greater saphenous vein was surgically absent small saphenous vein was within normal limits. She went to vein and vascular at [facility]. I believe she had an ablation on the left greater saphenous vein. I'll update her reflux studies perhaps ever reviewed by vein and vascular. We've made absolutely no progress in these wounds. Will also try to read and TheraSkins through her insurance 11/06/17; W the patient apparently has a 2 week follow-up with vein and vascular I like him to review the whole issue with regards to her previous vascular workup by Dr. Aleda Grana. We've really made no progress on these wounds in many months. She arrives today with less viable looking surface on the left medial ankle wound. This was apparently looking about the same on Tuesday when she was here for nurse visit. 11/13/17; deep tissue culture I did last time of the left lower leg showed multiple organisms without any predominating. In particular no Staphylococcus or group A strep were isolated. We sent her for venous reflux studies. She's had a previous left greater saphenous vein stripping and I think sclerotherapy of the right greater  saphenous vein. She didn't really look at the lesser saphenous vein this both wounds are on the medial aspect. She has reflux in the common femoral vein and popliteal vein and an accessory vein on the right and the common femoral vein and popliteal vein on the left. I'm going to have her go to see vein and vascular just the look over things and see if anything else beside aggressive compression is indicated here. We have not been able to make any progress on these wounds in spite of the fact that the surface of the wounds is never look too bad. 11/20/17; no  major change in the condition of the wounds. Patient reports a large amount of drainage. She has a lot of complaints of pain although enlisting her today I wonder if some of this at least his neuropathic rather than secondary to her wounds. She has an appointment with vein and vascular on 12/30/17. The refractory nature of these wounds in my mind at least need vein and vascular to look over the wounds the recent reflux studies we did and her history to see if anything further can be done here. I also note her gait is deteriorated quite a bit. 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 She has a bilateral Trendelenburg gait. I wonder if this is neuropathic or perhaps multilevel radicular. 11/27/17; her wounds actually looks slightly better. Healthy-looking granulation tissue a scant amount of epithelialization. [place] healthcare will not pay for [employer]. They will play for tri layer Oasis and Dermagraft. This is not a diabetic ulcer. We'll try for the tri layer Oasis. She still complains of some drainage. She has a vein and vascular appointment on 12/30/17 12/04/17; the wounds visually look quite good. Healthy-looking granulation with some degree of epithelialization. We are still waiting for response to our request for trial to try layer Oasis. Her appointment with vascular to review venous and arterial issues isn't sold the end of July 7/31. Not allow debridement or even vigorous cleansing of the wound surface. 12/18/17; slightly smaller especially on the right. 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 We've been using Hydrofera Blue. We still are looking into triple layer Oasis through her insurance 01/08/18 on evaluation today patient's wound actually appears to be showing signs of good improvement at this point in time. She has been tolerating the dressing changes without complication. Fortunately there does not appear to be any evidence of infection at this  point in time. We have been utilizing silver nitrate which does seem to be of benefit for her which is also good news. Overall I'm very happy with how things seem to be both regards appearance as well as measurement. Patient did see Dr. Bridgett Larsson for evaluation on 12/30/17. In his assessment he felt that stripping would not likely add much more than chronic compression to the patient's healing process. His recommendation was to follow-up in three months with Dr. Doren Custard if she hasn't healed in order to consider referral back to you and see vascular where she previously was in a trial and was able to get her wound to heal. I'll be see what she feels she when you staying compression and he reiterated this as well. 01/13/18 on evaluation today patient appears to actually be doing very well in regard to her bilateral medial malleolus ulcers. She seems to have tolerated the chemical cauterization with silver nitrate last week she did have some pain through that evening but fortunately states that I'll be  see since it seems to be doing better she is overall pleased with the progress. 01/21/18; really quite a remarkable improvement since I've last seen these wounds. We started using silver nitrate specially on the islands of hyper granulation which for some reason her around the wound circumference. This is really done quite nicely. Primary dressing Hydrofera Blue under 4 layer compression. She seems to be able to hold out without a nurse rewrap. Follow-up in 1 week 01/28/18; we've continued the hydrofera blue but continued with chemical cauterization to the wound area that we started about a month ago for irregular hyper granulation. She is made almost stunning improvement in the overall wound dimensions. I was not really expecting this degree of improvement in these chronic wounds 02/05/18; we continue with Hydrofera Bluebut of also continued the aggressive chemical cauterization with silver nitrate. We made nice  progress with the right greater than left wound. 02/12/18. We continued with Hydrofera Blue after aggressive chemical cauterization with silver nitrate. We appear to be making nice progress with both wound areas 02/19/2018; we continue with [facility] after washing the wounds vigorously with Anasept spray and chemical cauterization with silver nitrate. We are making excellent progress. The area on the right's just about closed 02/26/2018. The area on the left medial ankle had too much necrotic debris today. I used a #5 curette we are able to get most of the soft. I continued with the silver nitrate to the much smaller wound on the right medial ankle she had a new area on her right lower pretibial area which she says was due to a role in her compression 03/05/2018; both wound areas look healthy. Not much change in dimensions from last week. I continue to use silver nitrate and Hydrofera Blue. The patient saw Dr. Doren Custard of vein and vascular. He felt she had venous stasis ulcers. He felt based on her previous arteriogram she should have adequate circulation for healing. Also she has deep venous reflux but really no significant correctable superficial venous reflux at this time. He felt we should continue with conservative management including leg elevation and compression 04/02/2018; since we last saw this woman about a month ago she had a fall apparently suffered a pelvic fracture. I did not look up the x-ray. Nevertheless because of pain she literally was bedbound for 2 weeks and had home health coming out to change the dressing. Somewhat predictably this is resulted in considerable improvement in both wound areas. The right is just about closed on the medial malleolus and the left is about half the size. 04/16/2018; both her wounds continue to go down in size. Using Hydrofera Blue. 05/07/18; both her wounds appeared to be improving especially on the right where it is almost closed. We are using  Hydrofera Blue 05/14/2018; slightly worse this week with larger wounds. Surface on the left medial not quite as good. We have been using Hydrofera Blue 05/21/18; again the wounds are slightly larger. Left medial malleolus slightly larger with eschar around the circumference. We have been using Hydrofera Blue undergoing a wraps for a prolonged period of time. This got a lot better when she was more recumbent due to a fall and a back injury. I change the primary dressing the silver alginate today. She did not tolerate a 4 layer compression previously although I may need to bring this up with her next time 05/28/2018; area on the left medial malleolus again is slightly larger with more drainage. Area on the right is roughly unchanged.  She has a small area of folliculitis on the right medial just on the lower calf. This does not look ominous. 06/03/2018 left medial malleolus slightly smaller in a better looking surface. We used silver nitrate on this last time with silver alginate. The area on the right appears slightly smaller 1/10; left medial malleolus slightly smaller. Small open area on the right. We used silver nitrate and silver alginate as of 2 weeks ago. We continue with the wound and compression. These got a lot better when she was off her feet 1/17; right medial malleolus wound is smaller. The left may be slightly smaller. Both surfaces look somewhat better. 1/24; both wounds are slightly smaller. Using silver alginate under Unna boots 1/31; both wounds appear smaller in fact the area on the right medial is just about closed. Surface eschar. We have been using silver alginate under Unna boots. The patient is less active now spends let much less time on her feet and I think this is contributed to the general improvement in the wound condition 2/7; both wounds appear smaller. I was hopeful the right medial would be closed however there there is still the same small open area. Slight amount of  surface eschar on the left the dimensions are smaller there is eschar but the wound edges appear to be free. We have been using silver alginate under Unna boot's 2/14; both wounds once again measure smaller. Circumferential eschar on the left medial. We have been using silver alginate under Unna boots with gradual improvement 2/21; the area on the right medial malleolus has healed. The area on the left is smaller. We have been using silver alginate and Unna boots. We can discharge wrapping the right leg she has 20/30 stockings at home she will need to protect the scar tissue in this area 2/28; the area on the right medial malleolus remains closed the patient has a compression stocking. The area on the left is smaller. We have been using silver alginate and Unna boots. 3/6 the area on the right medial ankle remains closed. Good edema control noted she is using her own compression stocking. The area on the left medial ankle is smaller. We have been managing this with silver alginate and Unna boots which we will continue today. 3/13; the area on the right medial ankle remains closed and I'm declaring it healed today. When necessary the left is about the same still a healthy-looking surface but no major change and wound area. No evidence of infection and using silver alginate under unna and generally making considerable improvement 3/27 the area on the right medial ankle remains closed the area on the left is about the same as last week. Certainly not any worse we have been using silver alginate under an Unna boot 4/3; the area on the right medial ankle remains closed per the patient. We did not look at this wound. The wound on the left medial ankle is about the same surface looks healthy we have been using silver alginate under an Unna boot 4/10; area on the right medial ankle remains closed per the patient. We did not look at this wound. The wound on the left medial ankle is slightly larger.  The patient complains that the [facility] caused burning pain all week. She also told us that she was a lot more active this week. Changed her back to silver alginate 4/17; right medial ankle still closed per the patient. Left medial ankle is slightly larger. Using silver alginate. She did  not tolerate Hydrofera Blue on this area 4/24; right medial ankle remains closed we have not look at this. The left medial ankle continues to get larger today by about a centimeter. We have been using silver alginate under Unna boots. She complains about 4 layer compression as an alternative. She has been up on her feet working on her garden 5/8; right medial ankle remains closed we did not look at this. The left medial ankle has increased in size about 100%. We have been using silver alginate under Unna boots. She noted increased pain this week and was not surprised that the wound is deteriorated 5/15; no major change in SA however much less erythema ( one week of doxy ocellulitis). 5/22-64 year old female returns at 1 week to the clinic for left medial ankle wound for which we have been using silver alginate under 3 layer compression She was placed on DOXY at last visit - the wound is wider at this visit. She is in 3 layer compression 5/29; change to [facility] last week. I had given her empiric doxycycline 2 weeks ago for a week. She is in 3 layer compression. She complains of a lot of pain and drainage on presentation today. 6/5; using Hydrofera Blue. I gave her doxycycline recently empirically for erythema and pain around the wound. Believe her cultures showed enterococcus which not would not have been well covered by doxycycline nevertheless the wound looks better and I don't feel specifically that the enterococcus needs to be covered. She has a new what looks like a wrap injury on her lateral left ankle. 6/12; she is using Hydrofera Blue. She has a new area on the left anterior lower tibial area.  This was a wrap injury last week. 6/19; the patient is using Hydrofera Blue. She arrived with marked inflammation and erythema around the wound and tenderness. 12/01/18 on evaluation today patient appears to be doing a little bit better based on what I'm hearing from the standpoint of lassos evaluation to this as far as the overall appearance of the wound is concerned. Then sometime substandard she typically sees Dr. Dellia Nims. Nonetheless overall very pleased with the progress that she's made up to this point. No fevers, chills, nausea, or vomiting noted at this time. 7/10; some improvement in the surface area. Aggressively debrided last week apparently. I went ahead with the debridement today although the patient does not tolerate this very well. We have been using Iodoflex. Still a fair amount of drainage 7/17; slightly smaller. Using Iodoflex. 7/24; no change from last week in terms of surface area. We have been using Iodoflex. Surface looks and continues to look somewhat better 7/31; surface area slightly smaller better looking surface. We have been using Iodoflex. This is under Unna boot compression 8/7-Patient presents at 1 week with Unna boot and Iodoflex, wound appears better 8/14-Patient presents at 1 week with Iodoflex, we use the Unna boot, wound appears to be stable better.Patient is getting Botox treatment for the inversion of the foot for tendon release, Next week 8/21; we are using Iodoflex. Unna boot. The wound is stable in terms of surface area. Under illumination there is some areas of the wound that appear to be either epithelialized or perhaps this is adherent slough at this point I was not really clear. It did not wipe off and I was reluctant to debride this today. 8/28; we are using Iodoflex in an Unna boot. Seems to be making good improvement. 9/4; using Iodoflex and wound is slightly smaller.  9/18; we are using Iodoflex with topical silver nitrate when she is here. The wound  continues to be smaller 10/2; patient missed her appointment last week due to GI issues. She left and Iodoflex based dressing on for 2 weeks. Wound is about the same size about the size of a dime on the left medial lower 10/9 we have been using Iodoflex on the medial left ankle wound. She has a new superficial probable wrap injury on the dorsal left ankle 10/16; we have been using Hydrofera Blue since last week. This is on the left medial ankle 10/23; we have been using Hydrofera Blue since 2 weeks ago. This is on the left medial ankle. Dimensions are better 11/6; using Hydrofera Blue. I think the wound is smaller but still not closed. Left medial ankle 11/13; we have been using Hydrofera Blue. Wound is certainly no smaller this week. Also the surface not as good. This is the remanent of a very large area on her left medial ankle. 11/20; using Sorbact since last week. Wound was about the same in terms of size although I was disappointed about the surface debris 12/11; 3-week follow-up. Patient was on vacation. Wound is measuring slightly larger we have been using Sorbact. 12/18; wound is about the same size however surface looks better last week after debridement. We have been using Sorbact under compression 1/15 wound is probably twice the size of last time increased in length nonviable surface. We have been using Sorbact. She was running a mild fever and missed her appointment last week 1/22; the wound is come down in size but under illumination still a very adherent debris we have been Hydrofera Blue that I changed her to last week 1/29; dimensions down slightly. We have been using Hydrofera Blue 2/19 dimensions are the same however there is rims of epithelialization under illumination. Therefore more the surface area may be epithelialized 2/26; the patient's wound actually measures smaller. The wound looks healthy. We have been using Hydrofera Blue. I had some thoughts about running Apligraf  then I still may do that however this looks so much better this week we will delay that for now 3/5; the wound is small but about the same as last week. We have been using Hydrofera Blue. No debridement is required today. 3/19; the wound is about the size of a dime. Healthy looking wound even under illumination. We have been using Hydrofera Blue. No mechanical debridement is necessary 3/26; not much change from last week although still looks very healthy. We have been using Hydrofera Blue under Unna boots Patient was offered an ankle fusion by podiatry but not until the wound heals with a proceed with this. 4/9; the patient comes in today with her original wound on the medial ankle looking satisfactory however she has some uncontrolled swelling in the middle part of her leg with 2 new open areas superiorly just lateral to the tibia. I think this was probably a wrap issue. She said she felt uncomfortable during the week but did not call in. We have been using Hydrofera Blue 4/16; the wound on the medial ankle is about the same. She has innumerable small areas superior to this across her mid tibia. I think this is probably folliculitis. She is also been working in the yard doing a lot of sweating 4/30; the patient issue on the upper areas across her mid tibia of all healed. I think this was excessive yard work if I remember. Her wound on the medial ankle  is smaller. Some debris on this we have been using Hydrofera Blue under Unna boots 5/7; mid tibia. She has been using Hydrofera Blue under an Unna wrap. She is apparently going for her ankle surgery on June 3 10/28/19-Patient returns to clinic with the ankle wound, we are using Hydrofera Blue under Unna wrap, surgery is scheduled for her left foot for June 3 so she will be back for nurse visit next week READMISSION 01/17/2020 [name] is a 65 year old woman we have had in this clinic for a long period of time with severe venous hypertension and  refractory wounds on her medial lower legs and ankles bilaterally. 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 When she was off her legs for a prolonged period example when she fell and suffered a compression fracture things would heal up quite nicely. She is now retired and we managed to heal up the right medial leg wound. The left one was very tiny last time I saw this although still refractory. 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 It got to the point where this was interfering with her walking and she elected to proceed with a ankle arthrodesis to straighten her her ankle and leave her with a functional outcome for mobilization. The patient was referred to Dr. Doren Custard and really this took some time to arrange. Dr. Doren Custard saw her on 12/07/2019. Once again he verified that she had no arterial issues. She had previously had an angiogram several years ago. Follow-up ABIs on the left showed an ABI of 1.12 with triphasic waveforms and a TBI of 0.92. She is felt to have chronic deep venous insufficiency but I do not think it was felt that anything could be done from about this from an ablation point of view. At the time Dr. Doren Custard saw this patient the wounds actually look closed via the pictures in his clinic. The patient finally underwent her surgery on 12/15/2019. This went reasonably well and there was a good anatomic outcome. She developed a small distal wound dehiscence on the lateral part of the surgical wound. However more problematically she is developed recurrence of the wound on the medial left ankle. There are actually 2 wounds here one in the distal lower leg and 1 pretty much at the level of the medial malleolus. It is a more distal area that is more problematic. She has been using  Hydrofera Blue which started on Friday before this she was simply Ace wrapping. There was a culture done that showed Pseudomonas and she is on ciprofloxacin. A recent CNS on 8/11 was negative. The patient reports some pain but I generally think this is improving. She is using a cam boot completely nonweightbearing using a walker for pivot transfers and a wheelchair 8/24; not much improvement unfortunately she has a surgical wound on the lateral part in the venous insufficiency wound medially. The bottom part of the medial insufficiency wound is still necrotic there is exposed tendon here. We have been using Hydrofera Blue under compression. Her edema control is however better 8/31; patient in for follow-up of his surgical wound on the lateral part of her left leg and chronic venous insufficiency ulcers medially. We put her back in compression last week. She comes in today with a complaint of 3 or  4 days worth of increasing pain. She felt her cam walker was rubbing on the area on the back of her heel. However there is intense erythema seems more likely she has cellulitis. She had 2 cultures done when she was seeing podiatry in the postop. One of them in late July showed Pseudomonas and she received a course of ciprofloxacin the other was negative on 8/11 she is allergic to penicillin with anaphylactoid complaints of hives oral swelling via information in epic 9/9; when I saw this patient last week she had intense anterior erythema around her wound on the right lateral heel and ankle and also into the right medial heel. Some of this was no doubt drainage and her walker boot however I was convinced she had cellulitis. I gave her Levaquin and Bactrim she is finishing up on this now. She is following up with Dr. [name] he saw her yesterday. He is taken her out of the walking boot of course she is still nonweightbearing. Her x-ray was negative for any worrisome features such as soft tissue air etc. Things are  a lot better this week. She has home health. We have been using Hydrofera Blue under an [employer] which she put back on yesterday. I did not wrap her last week 9/17; her surrounding skin looks a lot better. In fact the area on the left lateral ankle has just a scant amount of eschar. The only remaining wound is the large area on the left medial ankle. Probably about 60% of this is healthy granulation at the surface however she has a significant divot distally. This has adherent debris in it. I been using debridement and silver collagen to try and get this area to fill-in although I do not think we have made much progress this week 9/24; the patient's wound on the left medial ankle looks a lot better. The deeper divot area distally still requires debridement but this is cleaning up quite nicely we have been using silver collagen. The patient is complaining of swelling in her foot and is worried that that is contributing to the nonhealing of the ankle wound. She is also complaining of numbness in her anterior toes 10/4; left medial ankle. The small area distally still has a divot with necrotic material that I have been debriding away. This has an undermining area. She is approved for Apligraf. She saw Dr. [name] her surgeon on 10/1. I think he declared himself is satisfied with the condition of things. Still nonweightbearing till the end of the month. 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 Her surgical wound is well closed. There is no evidence of infection 10/11; the patient arrived in clinic today with the expectation that we be able to put an Apligraf on this area after debridement however she arrives with a relatively large amount of green drainage on the dressing. The patient states that this started on Friday. She has not been systemically unwell. 10/19; culture I did last week showed both Enterococcus and Pseudomonas. I think this came in separate parts because I  stopped her ciprofloxacin I gave her and prescribed her linezolid however now looking at the final culture result this is Pseudomonas which is resistant to quinolones. She has not yet picked up the linezolid apparently phone issues. We are also trying to get a topical antibiotic out of [place] in [place] they can be applied by home health. She is still having green drainage 10/16; the patient has her topical  antibiotic from [facility] in [place]. This is a compounded gel with vancomycin and ciprofloxacin and gentamicin. We are applying this on the wound bed with silver alginate over the top with Unna boot wraps. She arrives in clinic today with a lot less ominous looking drainage although she is only use this topical preparation once the second time today. She sees Dr. [name] her surgeon on Friday she has home health changing the dressing 11/2; still using her compounded topical antibiotic under silver alginate. Surface is cleaning up there is less drainage. We had an Apligraf for her today and I elected to apply it. A light coating of her antibiotic 04/25/2020 upon evaluation today patient appears to be doing well with regard to her ankle ulcer. There is a little bit of slough noted on the surface of the wound I am can have to perform sharp debridement to clear this away today. With that being said other than that fact overall I feel like she is making progress and we do see some new epithelial growth. There is also some improvement in the depth of the wound and that distal portion. There is little bit of slough there as well. Objective Constitutional Well-nourished and well-hydrated in no acute distress. Vitals Time Taken: 9:37 AM, Height: 68 in, Temperature: 98.2 F, Pulse: 58 bpm, Respiratory Rate: 18 breaths/min, Blood Pressure: 123/66 mmHg. Respiratory normal breathing without difficulty. Psychiatric this patient is able to make decisions and demonstrates good insight into  disease process. Alert and Oriented x 3. pleasant and cooperative. General Notes: Upon inspection patient's wound bed did require sharp debridement to clear away some of the slough and necrotic debris from the surface of the wound. The patient tolerated that today without complication post debridement wound bed appears to be doing much better which is great news. I then proceeded to apply some of the [facility] pharmacy gel mixture for her antibiotic compounded gel. I did this in a thin film and then subsequently applied the Apligraf over top then using a piece of drawtex to hold the Apligraf down in place applying the Adaptic over top and securing this with Steri-Strips. The patient tolerated all this today with minimal discomfort which is great news. Integumentary (Hair, Skin) Wound #15 status is Open. Original cause of wound was Gradually Appeared. The wound is located on the Left,Medial Malleolus. The wound measures 4.3cm length x 1.7cm width x 0.5cm depth; 5.741cm^2 area and 2.871cm^3 volume. There is tendon and Fat Layer (Subcutaneous Tissue) exposed. There is no tunneling or undermining noted. There is a medium amount of purulent drainage noted. The wound margin is distinct with the outline attached to the wound base. There is medium (34-66%) red, pink granulation within the wound bed. There is a medium (34-66%) amount of necrotic tissue within the wound bed including Adherent Slough. 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 There was a Excisional Skin/Subcutaneous Tissue  Debridement with a total area of 7.31 sq cm performed by [name], PA. With the following instrument(s): Curette to remove Viable and Non-Viable tissue/material. Material removed includes Subcutaneous Tissue and Slough and after achieving pain control using Lidocaine 5% topical ointment.  No specimens were taken. A time out was conducted at 10:00, prior to the start of the procedure. A Minimum amount of bleeding was controlled with Pressure. The procedure was tolerated well with a pain level of 5 throughout and a pain level of 3 following the procedure. Post Debridement Measurements: 4.3cm length x 1.7cm width x 0.5cm depth; 2.871cm^3 volume. Character of Wound/Ulcer Post Debridement is improved. Severity of Tissue Post Debridement is: Fat layer exposed. Post procedure Diagnosis Wound #15: Same as Pre-Procedure Pre-procedure diagnosis of Wound #15 is a Venous Leg Ulcer located on the Left,Medial Malleolus. A skin graft procedure using a bioengineered skin substitute/cellular or tissue based product was performed by [name], PA with the following instrument(s): Forceps and Scissors. Apligraf was applied and secured with Steri-Strips. 22 sq cm of product was utilized and 22 sq cm was wasted due to wound size. Post Application, drawtex, adaptic, gauze was applied. A Time Out was conducted at 10:10, prior to the start of the procedure. The procedure was tolerated well with a pain level of 0 throughout and a pain level of 0 following the procedure. Post procedure Diagnosis Wound #15: Same as Pre-Procedure . Pre-procedure diagnosis of Wound #15 is a Venous Leg Ulcer located on the Left,Medial Malleolus . There was a Haematologist Compression Therapy Procedure by [name], RN. Post procedure Diagnosis Wound #15: Same as Pre-Procedure Plan Follow-up Appointments: Return Appointment in 2 weeks. - Tuesday with Dr. Dellia Nims for apligraf #3 Dressing Change Frequency: Wound #15 Left,Medial  Malleolus: Do not change entire dressing for one week. - Nov. 30 Tues patient to be seen by home health, outer dressing change only , DO NOT remove adaptic or steri strips Skin Barriers/Peri-Wound Care: Moisturizing lotion - to leg Wound Cleansing: May shower with protection. - use cast protector Primary Wound Dressing: Wound #15 Left,Medial Malleolus: Skin Substitute Application - APLI Graft #2 , apply thin layer of keystone gel , mix as ordered Mepitel or Adaptic - secured with steristrips Secondary Dressing: Wound #15 Left,Medial Malleolus: Dry Gauze ABD pad Drawtex Edema Control: Unna Boot to Left Lower Extremity Avoid standing for long periods of time Elevate legs to the level of the heart or above for 30 minutes daily and/or when sitting, a frequency of: - throughout the day Off-Loading: Other: - ensure no pressure to wound areas. Home Health: [facility] skilled nursing for wound care. - [facility] to see on Tues Nov 30 for unna and secondary dressing change only The following medication(s) was prescribed: lidocaine topical 5 % ointment ointment topical for prior to debridement was prescribed at facility 1. Would recommend currently that we go ahead and continue with the wound care measures as before with regard to the Apligraf I think this is still her best option I see nothing infection wise that would preclude her from continuing again I did apply Apligraf #2 today. 2. I am also can recommend that the patient continue with the Unna boot wrap I think that is helping with some of the edema control as well. 3. I am also going to suggest currently that the patient continue to monitor for any signs of worsening infection such as increased pain if anything occurs she should contact the office and let me know. We will see patient back for reevaluation in 2 weeks here in the clinic. If anything worsens or changes patient will contact our office for  additional recommendations. Electronic Signature(s) Signed: 04/25/2020 10:29:55 AM By: [name] PA-C Entered By:  [name] on 04/25/2020 10:29:54 -------------------------------------------------------------------------------- SuperBill Details Patient Name: Date of Service: [facility] [name] 04/25/2020 Medical Record Number: [id number] Patient Account Number: [id number] Date of Birth/Sex: Treating RN: 28-Jan-1956 (64 y.o. [name] Primary Care Provider: [name] Other Clinician: Referring Provider: Treating Provider/Extender: [name] in Treatment: 14 Diagnosis Coding ICD-10 Codes Code Description [phone] Chronic venous hypertension (idiopathic) with ulcer and inflammation of left lower extremity L97.828 Non-pressure chronic ulcer of other part of left lower leg with other specified severity L97.328 Non-pressure chronic ulcer of left ankle with other specified severity Facility Procedures CPT4 Code: [id number] Description: (Facility Use Only) Apligraf 1 SQ CM Modifier: Quantity: [place] Code: [id number] Description: 79038 - SKIN SUB GRAFT TRNK/ARM/LEG ICD-10 Diagnosis Description L97.828 Non-pressure chronic ulcer of other part of left lower leg with other specified se Modifier: verity Quantity: 1 CPT4 Code: [id number] Description: 91660 - DEBRIDE WOUND 1ST 20 SQ CM OR < ICD-10 Diagnosis Description L97.828 Non-pressure chronic ulcer of other part of left lower leg with other specified se Modifier: 59 verity Quantity: 1 Physician Procedures : CPT4 Code Description Modifier [id number] - WC PHYS SKIN SUB GRAFT TRNK/ARM/LEG ICD-10 Diagnosis Description L97.828 Non-pressure chronic ulcer of other part of left lower leg with other specified severity Quantity: 1 : [id number] - WC PHYS DEBR WO ANESTH 20 SQ CM 59 ICD-10 Diagnosis Description L97.828 Non-pressure chronic ulcer of other part of left lower leg with other specified  severity Quantity: 1 Electronic Signature(s) Signed: 04/25/2020 10:31:01 AM By: [name] PA-C Entered By: [name] on 04/25/2020 10:31:01

## 2020-05-01 ENCOUNTER — Ambulatory Visit (INDEPENDENT_AMBULATORY_CARE_PROVIDER_SITE_OTHER): Payer: Medicare Other

## 2020-05-01 ENCOUNTER — Other Ambulatory Visit: Payer: Self-pay

## 2020-05-01 ENCOUNTER — Encounter (HOSPITAL_BASED_OUTPATIENT_CLINIC_OR_DEPARTMENT_OTHER): Payer: Medicare Other | Admitting: Internal Medicine

## 2020-05-01 ENCOUNTER — Ambulatory Visit (INDEPENDENT_AMBULATORY_CARE_PROVIDER_SITE_OTHER): Payer: 59 | Admitting: Podiatry

## 2020-05-01 DIAGNOSIS — Z9889 Other specified postprocedural states: Secondary | ICD-10-CM

## 2020-05-01 DIAGNOSIS — L97322 Non-pressure chronic ulcer of left ankle with fat layer exposed: Secondary | ICD-10-CM

## 2020-05-01 DIAGNOSIS — R600 Localized edema: Secondary | ICD-10-CM

## 2020-05-01 DIAGNOSIS — I87332 Chronic venous hypertension (idiopathic) with ulcer and inflammation of left lower extremity: Secondary | ICD-10-CM | POA: Diagnosis not present

## 2020-05-01 DIAGNOSIS — M21542 Acquired clubfoot, left foot: Secondary | ICD-10-CM | POA: Diagnosis not present

## 2020-05-01 NOTE — Progress Notes (Signed)
[name], [name] ([id number]) Visit Report for 05/01/2020 SuperBill Details Patient Name: Date of Service: [facility], [name] 05/01/2020 Medical Record Number: [id number] Patient Account Number: [id number] Date of Birth/Sex: Treating RN: Apr 24, 1956 (64 y.o. [name] Primary Care Provider: [name] Other Clinician: Referring Provider: Treating Provider/Extender: [name] in Treatment: 15 Diagnosis Coding ICD-10 Codes Code Description [phone] Chronic venous hypertension (idiopathic) with ulcer and inflammation of left lower extremity L97.828 Non-pressure chronic ulcer of other part of left lower leg with other specified severity L97.328 Non-pressure chronic ulcer of left ankle with other specified severity Facility Procedures CPT4 Code Description Modifier Quantity [id number] (Facility Use Only) [phone] - APPLY MULTLAY COMPRS LWR RT LEG 1 Electronic Signature(s) Signed: 05/01/2020 5:54:32 PM By: Deon Pilling Signed: 05/01/2020 5:54:56 PM By: Linton Ham MD Entered By: Deon Pilling on 05/01/2020 13:21:35

## 2020-05-01 NOTE — Progress Notes (Signed)
° °  Subjective:  Patient presents today status post tibiotalar calcaneal arthrodesis left lower extremity. DOS: 12/15/2019.  Patient continues to be treated at the [facility] weekly for wound care to the medial aspect of the ankle.  She states there is significant improvement and the wound is slowly healing.  She has had two applications of Apligraf most recently.  She does have a history of chronic open wounds/venous stasis ulcers for over a decade now.  Past Medical History:  Diagnosis Date   Allergy    Ankle wound 02/2016   bilateral   Dental bridge present    lower   Dental crowns present    upper right   Open wounds involving multiple regions of lower extremity    Peripheral vascular disease ([place])    venous stasis ulcers bilateral lower extremities   PONV (postoperative nausea and vomiting)    Objective/Physical Exam Dressings were left intact today.  She goes to see the wound care center this afternoon.  Otherwise the foot is in a rectus position with good alignment of the foot in relationship to the leg.  The foot is in an almost 90 degree angle.  Radiographic exam: Orthopedic hardware appears intact and stable.  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  Patient has now over 3 months postoperative and radiographically appears stable and healing well.  No significant change since last x-rays  Assessment: 1. s/p tibiotalar calcaneal arthrodesis left. DOS: 12/15/2019 2.  Postsurgical edema left-mostly resolved 3.  Ulcer right medial ankle secondary to venous insufficiency 4.  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 Patient was evaluated.  2.  Continue management of wound ulcers to the medial aspect of the ankle at the wound care center. 3.  Continue full weightbearing in the cam boot.  Explained the patient that she will likely be in the cam boot for an additional 6 weeks full weightbearing full  activity. 4.  Return to clinic in 6 weeks.  At 6 weeks we will begin to transition the patient out of the cam boot if x-rays demonstrate good healing and the wound to the medial ankle have improved  Edrick Kins, DPM [facility]  Dr. Edrick Kins, DPM    [address], [place] [address]                Office [phone]  Fax [phone]

## 2020-05-01 NOTE — Progress Notes (Signed)
[name], [name] ([id number]) Visit Report for 05/01/2020 Arrival Information Details Patient Name: Date of Service: [facility], [name] 05/01/2020 12:30 PM Medical Record Number: [id number] Patient Account Number: [id number] Date of Birth/Sex: Treating RN: [birth year] (64 y.o. [name], [name] Primary Care Syrita Dovel: [name] Other Clinician: Referring Ranada Vigorito: Treating Bellanie Matthew/Extender: [name] in Treatment: 15 Visit Information History Since Last Visit Added or deleted any medications: No Patient Arrived: [name] Any new allergies or adverse reactions: No Arrival Time: 12:30 Had a fall or experienced change in No Accompanied By: self activities of daily living that may affect Transfer Assistance: None risk of falls: Patient Requires Transmission-Based Precautions: No Signs or symptoms of abuse/neglect since last visito No Patient Has Alerts: Yes Hospitalized since last visit: No Patient Alerts: L ABI =1.12, TBI = .92 Implantable device outside of the clinic excluding No R ABI= 1.02, TBI= .58 cellular tissue based products placed in the center since last visit: Has Dressing in Place as Prescribed: Yes Has Compression in Place as Prescribed: Yes Pain Present Now: No Electronic Signature(s) Signed: 05/01/2020 5:54:32 PM By: Deon Pilling Entered By: Deon Pilling on 05/01/2020 13:19:06 -------------------------------------------------------------------------------- Compression Therapy Details Patient Name: Date of Service: [facility] [name], [name] 05/01/2020 12:30 PM Medical Record Number: [id number] Patient Account Number: [id number] Date of Birth/Sex: Treating RN: April 27, 1956 (64 y.o. [name] Primary Care Jessabelle Markiewicz: [name] Other Clinician: Referring Jaycee Mckellips: Treating Lavana Huckeba/Extender: [name] in Treatment: 15 Compression Therapy Performed for Wound Assessment: Wound #15 Left,Medial  Malleolus Performed By: Clinician Deon Pilling, RN Compression Type: Rolena Infante Electronic Signature(s) Signed: 05/01/2020 5:54:32 PM By: Deon Pilling Entered By: Deon Pilling on 05/01/2020 13:19:41 -------------------------------------------------------------------------------- Encounter Discharge Information Details Patient Name: Date of Service: [facility] [name], [name] 05/01/2020 12:30 PM Medical Record Number: [id number] Patient Account Number: [id number] Date of Birth/Sex: Treating RN: 09/17/1955 (64 y.o. [name] Primary Care Matej Sappenfield: [name] Other Clinician: Referring [name]: Treating Necie Wilcoxson/Extender: [name] in Treatment: 15 Encounter Discharge Information Items Discharge Condition: Stable Ambulatory Status: Cane Discharge Destination: Home Transportation: Private Auto Accompanied By: self Schedule Follow-up Appointment: Yes Clinical Summary of Care: Electronic Signature(s) Signed: 05/01/2020 5:54:32 PM By: Deon Pilling Entered By: Deon Pilling on 05/01/2020 13:21:19 -------------------------------------------------------------------------------- Patient/Caregiver Education Details Patient Name: Date of Service: [name], [name] 11/30/2021andnbsp12:30 PM Medical Record Number: [id number] Patient Account Number: [id number] Date of Birth/Gender: Treating RN: May 03, 1956 (64 y.o. [name] Primary Care Physician: [name] Other Clinician: Referring Physician: Treating Physician/Extender: [name] in Treatment: 15 Education Assessment Education Provided To: Patient Education Topics Provided Nutrition: Handouts: Nutrition Methods: Explain/Verbal Responses: Reinforcements needed Electronic Signature(s) Signed: 05/01/2020 5:54:32 PM By: Deon Pilling Entered By: Deon Pilling on 05/01/2020  13:21:10 -------------------------------------------------------------------------------- Wound Assessment Details Patient Name: Date of Service: [facility] [name], [name] 05/01/2020 12:30 PM Medical Record Number: [id number] Patient Account Number: [id number] Date of Birth/Sex: Treating RN: 01/25/1956 (64 y.o. [name], [name] Primary Care Claudett Bayly: [name] Other Clinician: Referring [name]: Treating [name]/Extender: [name] in Treatment: 15 Wound Status Wound Number: 15 Primary Etiology: Venous Leg Ulcer Wound Location: Left, Medial Malleolus Wound Status: Open Wounding Event: Gradually Appeared Date Acquired: 12/30/2019 Weeks Of Treatment: 15 Clustered Wound: No Wound Measurements Length: (cm) 4.3 Width: (cm) 1.7 Depth: (cm) 0.5 Area: (cm) 5.741 Volume: (cm) 2.871 % Reduction in Area: 50.8% % Reduction in Volume: 69.2% Wound Description  Classification: Full Thickness With Exposed Support Structure s Treatment Notes Wound #15 (Left, Medial Malleolus) 1. Cleanse With Soap and water 2. Periwound Care Moisturizing lotion 4. Secondary Dressing ABD Pad Dry Gauze 6. Support Layer [employer] Notes left apligraft, steri-strips, and adaptic in place. washed and cleansed the leg. reapplied gauze, abd pad, and unna boot. Electronic Signature(s) Signed: 05/01/2020 5:54:32 PM By: Deon Pilling Entered By: Deon Pilling on 05/01/2020 13:19:26 -------------------------------------------------------------------------------- Vitals Details Patient Name: Date of Service: [name], [name] 05/01/2020 12:30 PM Medical Record Number: [id number] Patient Account Number: [id number] Date of Birth/Sex: Treating RN: 01/29/1956 (64 y.o. [name], Meta.Reding Primary Care Braheem Tomasik: [name] Other Clinician: Referring [name]: Treating Shizuo Biskup/Extender: [name] in Treatment: 15 Vital Signs Time Taken:  12:30 Temperature (F): 98 Height (in): 68 Pulse (bpm): 67 Respiratory Rate (breaths/min): 20 Blood Pressure (mmHg): 110/67 Reference Range: 80 - 120 mg / dl Electronic Signature(s) Signed: 05/01/2020 5:54:32 PM By: Deon Pilling Entered By: Deon Pilling on 05/01/2020 13:19:19

## 2020-05-02 ENCOUNTER — Other Ambulatory Visit: Payer: Self-pay | Admitting: Podiatry

## 2020-05-02 ENCOUNTER — Telehealth: Payer: Self-pay

## 2020-05-02 MED ORDER — OXYCODONE-ACETAMINOPHEN 5-325 MG PO TABS: 1.0000 | ORAL_TABLET | Freq: Three times a day (TID) | ORAL | 0 refills | Status: DC | PRN

## 2020-05-02 NOTE — Telephone Encounter (Signed)
Rx sent 

## 2020-05-02 NOTE — Telephone Encounter (Signed)
Patient called requesting a refill of her pain medication.  Please advise

## 2020-05-02 NOTE — Progress Notes (Signed)
PRN foot and ankle pain

## 2020-05-08 ENCOUNTER — Encounter (HOSPITAL_BASED_OUTPATIENT_CLINIC_OR_DEPARTMENT_OTHER): Payer: Medicare Other | Attending: Internal Medicine | Admitting: Internal Medicine

## 2020-05-08 ENCOUNTER — Other Ambulatory Visit: Payer: Self-pay

## 2020-05-08 DIAGNOSIS — I87332 Chronic venous hypertension (idiopathic) with ulcer and inflammation of left lower extremity: Secondary | ICD-10-CM | POA: Diagnosis present

## 2020-05-08 DIAGNOSIS — L97328 Non-pressure chronic ulcer of left ankle with other specified severity: Secondary | ICD-10-CM | POA: Diagnosis not present

## 2020-05-08 DIAGNOSIS — L97828 Non-pressure chronic ulcer of other part of left lower leg with other specified severity: Secondary | ICD-10-CM | POA: Insufficient documentation

## 2020-05-08 NOTE — Progress Notes (Signed)
[name], [name] ([id number]) Visit Report for 05/08/2020 Arrival Information Details Patient Name: Date of Service: [facility] [name], [name] 05/08/2020 9:15 A M Medical Record Number: [id number] Patient Account Number: [id number] Date of Birth/Sex: Treating RN: Oct 02, 1955 (64 y.o. [name] Primary Care Nakea Gouger: [name] Other Clinician: Referring Lyann Hagstrom: Treating Tonae Livolsi/Extender: [name] in Treatment: 16 Visit Information History Since Last Visit Added or deleted any medications: No Patient Arrived: [name] Any new allergies or adverse reactions: No Arrival Time: 09:13 Had a fall or experienced change in No Accompanied By: self activities of daily living that may affect Transfer Assistance: None risk of falls: Patient Identification Verified: Yes Signs or symptoms of abuse/neglect since last visito No Secondary Verification Process Completed: Yes Hospitalized since last visit: No Patient Requires Transmission-Based Precautions: No Implantable device outside of the clinic excluding No Patient Has Alerts: Yes cellular tissue based products placed in the center Patient Alerts: L ABI =1.12, TBI = .92 since last visit: R ABI= 1.02, TBI= .58 Has Dressing in Place as Prescribed: Yes Pain Present Now: No Electronic Signature(s) Signed: 05/08/2020 1:53:43 PM By: [name] Entered By: [name] on 05/08/2020 09:14:28 -------------------------------------------------------------------------------- Encounter Discharge Information Details Patient Name: Date of Service: [name], [name] 05/08/2020 9:15 A M Medical Record Number: [id number] Patient Account Number: [id number] Date of Birth/Sex: Treating RN: [birth year] (64 y.o. [name] Primary Care Heriberto Stmartin: [name] Other Clinician: Referring Deshon Hsiao: Treating Bach Rocchi/Extender: [name] in Treatment: 16 Encounter Discharge Information Items  Post Procedure Vitals Discharge Condition: Stable Temperature (F): 98.1 Ambulatory Status: Cane Pulse (bpm): 70 Discharge Destination: Home Respiratory Rate (breaths/min): 20 Transportation: Private Auto Blood Pressure (mmHg): 101/50 Accompanied By: self Schedule Follow-up Appointment: Yes Clinical Summary of Care: Electronic Signature(s) Signed: 05/08/2020 4:58:51 PM By: Deon Pilling Entered By: Deon Pilling on 05/08/2020 10:14:45 -------------------------------------------------------------------------------- Lower Extremity Assessment Details Patient Name: Date of Service: [name] [name], [name] 05/08/2020 9:15 A M Medical Record Number: [id number] Patient Account Number: [id number] Date of Birth/Sex: Treating RN: 09/01/1955 (64 y.o. [name] Primary Care Rye Decoste: [name] Other Clinician: Referring Arvon Schreiner: Treating Shreeya Recendiz/Extender: [name] in Treatment: 16 Edema Assessment Assessed: [name]: No] [Right: No] Edema: [Left: Ye] [Right: s] Calf Left: Right: Point of Measurement: From Medial Instep 30 cm Ankle Left: Right: Point of Measurement: From Medial Instep 21 cm Electronic Signature(s) Signed: 05/08/2020 5:28:36 PM By: [name] Coria RN Entered By: [name] on 05/08/2020 09:33:58 -------------------------------------------------------------------------------- Multi Wound Chart Details Patient Name: Date of Service: [name] [name], [name] Kingfisher G. 05/08/2020 9:15 A M Medical Record Number: [id number] Patient Account Number: [id number] Date of Birth/Sex: Treating RN: December 29, 1955 (64 y.o. [name] Primary Care Yang Rack: [name] Other Clinician: Referring Tyren Dugar: Treating Eluzer Howdeshell/Extender: [name] in Treatment: 16 Vital Signs Height(in): 68 Pulse(bpm): 70 Weight(lbs): Blood Pressure(mmHg): 101/50 Body Mass Index(BMI): Temperature(F): 98.1 Respiratory Rate(breaths/min):  20 Photos: [15:No Photos Left, Medial Malleolus] [N/A:N/A N/A] Wound Location: [15:Gradually Appeared] [N/A:N/A] Wounding Event: [15:Venous Leg Ulcer] [N/A:N/A] Primary Etiology: [15:Peripheral Venous Disease,] [N/A:N/A] Comorbid History: [15:Osteoarthritis, Confinement Anxiety 12/30/2019] [N/A:N/A] Date Acquired: [15:16] [N/A:N/A] Weeks of Treatment: [15:Open] [N/A:N/A] Wound Status: [15:3.9x1.4x0.8] [N/A:N/A] Measurements L x W x D (cm) [15:4.288] [N/A:N/A] A (cm) : rea [15:3.431] [N/A:N/A] Volume (cm) : [15:63.20%] [N/A:N/A] % Reduction in Area: [15:63.20%] [N/A:N/A] % Reduction in Volume: [15:Full Thickness With Exposed Support] [N/A:N/A] Classification: [15:Structures Medium] [N/A:N/A] Exudate Amount: [15:Serosanguineous] [N/A:N/A] Exudate Type: [  15:red, brown] [N/A:N/A] Exudate Color: [15:Medium (34-66%)] [N/A:N/A] Granulation Amount: [15:Pink] [N/A:N/A] Granulation Quality: [15:Medium (34-66%)] [N/A:N/A] Necrotic Amount: [15:Fat Layer (Subcutaneous Tissue): Yes N/A] Exposed Structures: [15:Fascia: No Tendon: No Muscle: No Joint: No Bone: No Small (1-33%)] [N/A:N/A] Epithelialization: [15:Debridement - Selective/Open Wound N/A] Debridement: Pre-procedure Verification/Time Out 09:40 [N/A:N/A] Taken: [15:Lidocaine 5% topical ointment] [N/A:N/A] Pain Control: [15:Slough] [N/A:N/A] Tissue Debrided: [15:Skin/Epidermis] [N/A:N/A] Level: [15:5.46] [N/A:N/A] Debridement A (sq cm): [15:rea Curette] [N/A:N/A] Instrument: [15:Minimum] [N/A:N/A] Bleeding: [15:Pressure] [N/A:N/A] Hemostasis A chieved: [15:0] [N/A:N/A] Procedural Pain: [15:0] [N/A:N/A] Post Procedural Pain: [15:Procedure was tolerated well] [N/A:N/A] Debridement Treatment Response: [15:3.9x1.4x0.8] [N/A:N/A] Post Debridement Measurements L x W x D (cm) [15:3.431] [N/A:N/A] Post Debridement Volume: (cm) [15:Cellular or Tissue Based Product] [N/A:N/A] Procedures Performed: [15:Debridement] Treatment  Notes Electronic Signature(s) Signed: 05/08/2020 5:06:48 PM By: Linton Ham MD Signed: 05/08/2020 5:28:36 PM By: [name] Coria RN Entered By: Linton Ham on 05/08/2020 09:51:07 -------------------------------------------------------------------------------- Multi-Disciplinary Care Plan Details Patient Name: Date of Service: [facility] [name], [name] 05/08/2020 9:15 A M Medical Record Number: [id number] Patient Account Number: [id number] Date of Birth/Sex: Treating RN: September 10, 1955 (64 y.o. [name] Primary Care Diem Dicocco: [name] Other Clinician: Referring Kaiea Esselman: Treating Shaliah Wann/Extender: [name] in Treatment: 16 Active Inactive Wound/Skin Impairment Nursing Diagnoses: Impaired tissue integrity Knowledge deficit related to ulceration/compromised skin integrity Goals: Patient/caregiver will verbalize understanding of skin care regimen Date Initiated: 01/17/2020 Target Resolution Date: 06/07/2020 Goal Status: Active Ulcer/skin breakdown will have a volume reduction of 30% by week 4 Date Initiated: 01/17/2020 Date Inactivated: 03/12/2020 Target Resolution Date: 03/09/2020 Goal Status: Met Interventions: Assess patient/caregiver ability to obtain necessary supplies Assess patient/caregiver ability to perform ulcer/skin care regimen upon admission and as needed Assess ulceration(s) every visit Provide education on ulcer and skin care Notes: Electronic Signature(s) Signed: 05/08/2020 5:28:36 PM By: [name] Coria RN Entered By: [name] on 05/08/2020 09:12:34 -------------------------------------------------------------------------------- Pain Assessment Details Patient Name: Date of Service: [facility] [name], [name] Kingfisher G. 05/08/2020 9:15 A M Medical Record Number: [id number] Patient Account Number: [id number] Date of Birth/Sex: Treating RN: [birth year] (64 y.o. [name] Primary Care Darek Eifler: [name] Other Clinician: Referring  Brandn Mcgath: Treating Kadden Osterhout/Extender: [name] in Treatment: 16 Active Problems Location of Pain Severity and Description of Pain Patient Has Paino No Site Locations Pain Management and Medication Current Pain Management: Electronic Signature(s) Signed: 05/08/2020 1:53:43 PM By: [name] Signed: 05/08/2020 5:28:36 PM By: [name] Coria RN Entered By: [name] on 05/08/2020 09:16:12 -------------------------------------------------------------------------------- Patient/Caregiver Education Details Patient Name: Date of Service: [name], [name] 12/7/2021andnbsp9:15 A M Medical Record Number: [id number] Patient Account Number: [id number] Date of Birth/Gender: Treating RN: 1955/12/04 (64 y.o. [name] Primary Care Physician: [name] Other Clinician: Referring Physician: Treating Physician/Extender: [name] in Treatment: 16 Education Assessment Education Provided To: Patient Education Topics Provided Wound/Skin Impairment: Methods: Explain/Verbal Responses: State content correctly [employer]) Signed: 05/08/2020 5:28:36 PM By: [name] Coria RN Entered By: [name] on 05/08/2020 09:12:52 -------------------------------------------------------------------------------- Wound Assessment Details Patient Name: Date of Service: [facility] [name], [name] Kingfisher G. 05/08/2020 9:15 A M Medical Record Number: [id number] Patient Account Number: [id number] Date of Birth/Sex: Treating RN: 1955/10/24 (64 y.o. [name] Primary Care Jamonte Curfman: [name] Other Clinician: Referring Antavion Bartoszek: Treating Mirren Gest/Extender: [name] in Treatment: 16 Wound Status Wound Number: 15 Primary Venous Leg Ulcer Etiology: Wound Location: Left, Medial Malleolus Wound Status: Open Wounding Event: Gradually Appeared Comorbid Peripheral  Venous Disease, Osteoarthritis,  Confinement Date Acquired: 12/30/2019 History: Anxiety Weeks Of Treatment: 16 Clustered Wound: No Photos Photo Uploaded By: Mikeal Hawthorne on 05/08/2020 15:46:59 Wound Measurements Length: (cm) 3.9 Width: (cm) 1.4 Depth: (cm) 0.8 Area: (cm) 4.288 Volume: (cm) 3.431 % Reduction in Area: 63.2% % Reduction in Volume: 63.2% Epithelialization: Small (1-33%) Tunneling: No Undermining: No Wound Description Classification: Full Thickness With Exposed Support Structures Exudate Amount: Medium Exudate Type: Serosanguineous Exudate Color: red, brown Foul Odor After Cleansing: No Slough/Fibrino Yes Wound Bed Granulation Amount: Medium (34-66%) Exposed Structure Granulation Quality: Pink Fascia Exposed: No Necrotic Amount: Medium (34-66%) Fat Layer (Subcutaneous Tissue) Exposed: Yes Necrotic Quality: Adherent Slough Tendon Exposed: No Muscle Exposed: No Joint Exposed: No Bone Exposed: No Treatment Notes Wound #15 (Malleolus) Wound Laterality: Left, Medial Cleanser Peri-Wound Care Topical Primary Dressing apligraf Discharge Instruction: applied by MD : DO NOT REMOVE Secondary Dressing Woven Gauze Sponge, Non-Sterile 4x4 in Discharge Instruction: Apply over primary dressing as directed. ABD Pad, 5x9 Discharge Instruction: Apply over primary dressing as directed. Drawtex 4x4 in Discharge Instruction: Apply over primary dressing as directed. Secured With Compression Wrap Unnaboot w/Calamine, 4x10 (in/yd) Discharge Instruction: Apply Unnaboot as directed. Compression Stockings Add-Ons Electronic Signature(s) Signed: 05/08/2020 5:28:36 PM By: [name] Coria RN Entered By: [name] on 05/08/2020 09:34:55 -------------------------------------------------------------------------------- Vitals Details Patient Name: Date of Service: [name], [name] 05/08/2020 9:15 A M Medical Record Number: [id number] Patient Account Number: [id number] Date of Birth/Sex: Treating  RN: March 06, 1956 (64 y.o. [name] Primary Care Krist Rosenboom: [name] Other Clinician: Referring Vladislav Axelson: Treating Elynore Dolinski/Extender: [name] in Treatment: 16 Vital Signs Time Taken: 09:14 Temperature (F): 98.1 Height (in): 68 Pulse (bpm): 70 Respiratory Rate (breaths/min): 20 Blood Pressure (mmHg): 101/50 Reference Range: 80 - 120 mg / dl Electronic Signature(s) Signed: 05/08/2020 1:53:43 PM By: [name] Entered By: [name] on 05/08/2020 09:16:03

## 2020-05-08 NOTE — Progress Notes (Signed)
[name], [name] ([id number]) Visit Report for 05/08/2020 Cellular or Tissue Based Product Details Patient Name: Date of Service: [facility] [name], [name] 05/08/2020 9:15 A M Medical Record Number: [id number] Patient Account Number: [id number] Date of Birth/Sex: Treating RN: 08/26/1955 (64 y.o. [name] Primary Care Provider: [name] Other Clinician: Referring Provider: Treating Provider/Extender: [name] in Treatment: 16 Cellular or Tissue Based Product Type Wound #15 Left,Medial Malleolus Applied to: Performed By: Physician [name]., MD Cellular or Tissue Based Product Type: Apligraf Level of Consciousness (Pre-procedure): Awake and Alert Pre-procedure Verification/Time Out Yes - 09:43 Taken: Location: trunk / arms / legs Wound Size (sq cm): 5.46 Product Size (sq cm): 44 Waste Size (sq cm): 0 Amount of Product Applied (sq cm): 44 Instrument Used: Blade, Forceps Lot #: [id number] Order #: 3 Expiration Date: 05/12/2020 Fenestrated: Yes Instrument: Blade Reconstituted: Yes Solution Type: normal saline Solution Amount: 17ml Lot #: [id number] Solution Expiration Date: 11/20/2021 Secured: Yes Secured With: Steri-Strips Dressing Applied: Yes Primary Dressing: adaptic Procedural Pain: 0 Post Procedural Pain: 0 Response to Treatment: Procedure was tolerated well Level of Consciousness (Post- Awake and Alert procedure): Post Procedure Diagnosis Same as Pre-procedure Electronic Signature(s) Signed: 05/08/2020 5:06:48 PM By: Linton Ham MD Entered By: Linton Ham on 05/08/2020 09:51:35 -------------------------------------------------------------------------------- Debridement Details Patient Name: Date of Service: [name], [name] 05/08/2020 9:15 A M Medical Record Number: [id number] Patient Account Number: [id number] Date of Birth/Sex: Treating RN: [birth year] (64 y.o. [name] Primary Care Provider: [name] Other Clinician: Referring Provider: Treating Provider/Extender: [name] in Treatment: 16 Debridement Performed for Assessment: Wound #15 Left,Medial Malleolus Performed By: Physician [name]., MD Debridement Type: Debridement Severity of Tissue Pre Debridement: Fat layer exposed Level of Consciousness (Pre-procedure): Awake and Alert Pre-procedure Verification/Time Out Yes - 09:40 Taken: Start Time: 09:40 Pain Control: Lidocaine 5% topical ointment T Area Debrided (L x W): otal 3.9 (cm) x 1.4 (cm) = 5.46 (cm) Tissue and other material debrided: Viable, Non-Viable, Slough, Skin: Dermis , Skin: Epidermis, Slough Level: Skin/Epidermis Debridement Description: Selective/Open Wound Instrument: Curette Bleeding: Minimum Hemostasis Achieved: Pressure End Time: 09:43 Procedural Pain: 0 Post Procedural Pain: 0 Response to Treatment: Procedure was tolerated well Level of Consciousness (Post- Awake and Alert procedure): Post Debridement Measurements of Total Wound Length: (cm) 3.9 Width: (cm) 1.4 Depth: (cm) 0.8 Volume: (cm) 3.431 Character of Wound/Ulcer Post Debridement: Improved Severity of Tissue Post Debridement: Fat layer exposed Post Procedure Diagnosis Same as Pre-procedure Electronic Signature(s) Signed: 05/08/2020 5:06:48 PM By: Linton Ham MD Signed: 05/08/2020 5:28:36 PM By: [name] Coria RN Entered By: Linton Ham on 05/08/2020 09:51:22 -------------------------------------------------------------------------------- HPI Details Patient Name: Date of Service: [name], [name] 05/08/2020 9:15 A M Medical Record Number: [id number] Patient Account Number: [id number] Date of Birth/Sex: Treating RN: 1955/07/02 (64 y.o. [name] Primary Care Provider: [name] Other Clinician: Referring Provider: Treating Provider/Extender: [name] in Treatment: 16 History of Present  Illness HPI Description: the remaining wound is over the left medial ankle. Similar wound over the right medial ankle healed largely with use of Apligraf. Most recently we have been using Hydrofera Blue over this wound with considerable improvement. The patient has been extensively worked up in the past for her venous insufficiency and she is not a candidate for antireflux surgery although I have none of the details available currently. 08/24/14; considerable improvement today. About 50% of this wound  areas now epithelialized. The base of the wound appears to be healthier granulation.as opposed to last week when she had deteriorated a considerable improvement 08/17/14; unfortunately the wound has regressed somewhat. The areas of epithelialization from the superior aspect are not nearly as healthy as they were last week. The patient thinks her Hydrofera Blue slipped. 09/07/14; unfortunately the area has markedly regressed in the 2 weeks since I've seen this. There is an odor surrounding erythema. The healthy granulation tissue that we had at the base of the wound now is a dusky color. The nurse reports green drainage 09/14/14; the area looks somewhat better than last week. There is less erythema and less drainage. The culture I did did not show any growth. Nevertheless I think it is better to continue the Cipro and doxycycline for a further week. The remaining wound area was debridement. 09/21/14. Wound did not require debridement last week. Still less erythema and less drainage. She can complete her antibiotics. The areas of epithelialization in the superior aspect of the wound do not look as healthy as they did some weeks ago 10/05/14 continued improvement in the condition of this wound. There is advancing epithelialization. Less aggressive debridement required 10/19/14 continued improvement in the condition and volume of this wound. Less aggressive debridement to the inferior part of this to remove surface  slough and fibrinous eschar 11/02/14 no debridement is required. The surface granulation appears healthy although some of her islands of epithelialization seem to have regressed. No evidence of infection 11/16/14; lites surface debridement done of surface eschar. The wound does not look to be unhealthy. No evidence of infection. Unfortunately the patient has had podiatry issues in the right foot and for some reason has redeveloped small surface ulcerations in the medial right ankle. Her original presentation involved wounds in this area 11/23/14 no debridement. The area on the right ankle has enlarged. The left ankle wound appears stable in terms of the surface although there is periwound inflammation. There has been regression in the amount of new skin 11/30/14 no debridement. Both wound areas appear healthy. There was no evidence of infection. The the new area on the right medial ankle has enlarged although that both the surfaces appear to be stable. 12/07/14; Debridement of the right medial ankle wound. No no debridement was done on the left. 12/14/14 no major change in and now bilateral medial ankle wounds. Both of these are very painful but the no overt evidence of infection. She has had previous venous ablation 12/21/14; patient states that her right medial ankle wound is considerably more painful last week than usual. Her left is also somewhat painful. She could not tolerate debridement. The right medial ankle wound has fibrinous surface eschar 12/28/14 this is a patient with severe bilateral venous insufficiency ulcers. For a considerable period of time we actually had the one on the right medial ankle healed however this recently opened up again in June. The left medial ankle wound has been a refractory area with some absent flows. We had some success with Hydrofera Blue on this area and it literally closed by 50% however it is recently opened up Foley. Both of these were debridement today of  surface eschar. She tolerates this poorly 01/25/15: No change in the status of this. Thick adherent escar. Very poor tolerance of any attempt at debridement. I had healed the right medial malleolus wound for a considerable amount of time and had the left one down to about 50% of the volume although this is  totally regressed over the last 48 weeks. Further the right leg has reopened. she is trying to make a appointment with pain and vascular, previous ablations with Dr. Aleda Grana. I do not believe there is an arterial insufficiency issue here 02/01/15 the status of the adherent eschar bilaterally is actually improved. No debridement was done. She did not manage to get vascular studies done 02/08/15 continued debridement of the area was done today. The slough is less adherent and comes off with less pressure. There is no surrounding infection peripheral pulses are intact 02/15/15 selective debridement with a disposable curette. Again the slough is less adherent and comes off with less difficulty. No surrounding infection peripheral pulses are intact. 02/22/15 selective debridement of the right medial ankle wound. Slough comes off with less difficulty. No obvious surrounding infection peripheral pulses are intact I did not debridement the one on the left. Both of these are stable to improved 03/01/15 selective debridement of both wound areas using a curette to. Adherent slough cup soft with less difficulty. No obvious surrounding infection. The patient tells me that 2 days ago she noted a rash above the right leg wrap. She did not have this on her lower legs when she change this over she arrives with widespread left greater than right almost folliculitis-looking rash which is extremely pruritic. I don't see anything to culture here. There is no rash on the rest of her body. She feels well systemically. 03/08/15; selective debridement of both wounds using a curette. Base of this does not look unhealthy. She  had limegreen drainage coming out of the left leg wound and describes a lot of drainage. The rash on her left leg looks improved to. No cultures were done. 03/22/15; patient was not here last week. Basal wounds does not look healthy and there is no surrounding erythema. No drainage. There is still a rash on the left leg that almost looks vasculitic however it is clearly limited to the top of where the wrap would be. 04/05/15; on the right required a surgical debridement of surface eschar and necrotic subcutaneous tissue. I did not debridement the area on the left. These continue to be large open wounds that are not changing that much. We were successful at one point in healing the area on the right, and at the same time the area on the left was roughly half the size of current measurements. I think a lot of the deterioration has to do with the prolonged time the patient is on her feet at work 04/19/15 I attempted-like surface debridement bilaterally she does not tolerate this. She tells me that she was in allergic care yesterday with extreme pain over her left lateral malleolus/ankle and was told that she has an "sprain" 05/03/15; large bilateral venous insufficiency wounds over the medial malleolus/medial aspect of her ankles. She complains of copious amounts of drainage and his usual large amounts of pain. There is some increasing erythema around the wound on the right extending into the medial aspect of her foot to. historically she came in with these wounds the right one healed and the left one came down to roughly half its current size however the right one is reopened and the left is expanded. 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 05/10/15 large bilateral venous insufficiency wounds. There is less adherence surface left however the surface culture that I did last week grew pseudomonas therefore bilateral selective debridement score  necessary.  There is surrounding erythema. The patient describes severe bilateral drainage and a lot of pain in the left ankle. Apparently her podiatrist was were ready to do a cortisone shot 05/17/15; the patient complains of pain and again copious amounts of drainage. 05/24/15; we used Iodo flex last week. Patient notes considerable improvement in wound drainage. Only needed to change this once. 05/31/15; we continued Iodoflex; the base of these large wounds bilaterally is not too bad but there is probably likely a significant bioburden here. I would like to debridement just doesn't tolerate it. 06/06/14 I would like to continue the Iodoflex although she still hasn't managed to obtain supplies. She has bilateral medial malleoli or large wounds which are mostly superficial. Both of them are covered circumferentially with some nonviable fibrinous slough although she tolerates debridement very poorly. She apparently has an appointment for an ablation on the right leg by interventional radiology. 06/14/15; the patient arrives with the wounds and static condition. We attempted a debridement although she does not do well with this secondary to pain. I 07/05/15; wounds are not much smaller however there appears to be a cleaner granulating base. The left has tight fibrinous slough greater than the right. Debridement is tolerated poorly due to pain. Iodoflex is done more for these wounds in any of the multitude of different dressings I have tried on the left 1 and then subsequently the right. 07/12/15; no change in the condition of this wound. I am able to do an aggressive debridement on the right but not the left. She simply cannot tolerate it. We have been using Iodoflex which helps somewhat. It is worthwhile remembering that at one point we healed the right medial ankle wound and the left was about 25% of the current circumference. We have suggested returning to vascular surgery for review of possible further ablations for  one reason or another she has not been able to do this. 07/26/15 no major change in the condition of either wound on her medial ankle. I did not attempt to debridement of these. She has been aggressively scrubbing these while she is in the shower at home. She has her supply of Iodoflex which seems to have done more for these wounds then anything I have put on recently. 08/09/15 wound area appears larger although not verified by measurements. Using Iodoflex 09/05/2015 -- she was here for avisit today but had significant problems with the wound and I was asked to see her for a physician opinion. I have summarize that this lady has had surgery on her left lower extremity about 10 years ago where the possible veins stripping was done. She has had an opinion from interventional radiology around November 2016 where no further sclerotherapy was ordered. The patient works 12 hours a day and stands on a concrete floor with work boots and is unable to get the proper compression she requires and cannot elevate her limbs appropriately at any given time. She has recently grown Pseudomonas from her wound culture but has not started her ciprofloxacin which was called in for her. 09/13/15 this continues to be a difficult situation for this patient. At one point I had this wound down to a 1.5 x 1.5" wound on her left leg. This is deteriorated and the right leg has reopened. She now has substantial wounds on her medial calcaneus, malleoli and into her lower leg. One on the left has surface eschar but these are far too painful for me to debridement here. She has a  vascular surgery appointment next week to see if anything can be done to help here. I think she has had previous ablations several years ago at [facility]. She has no major edema. She tells me that she did not get product last time [facility] and went for several days without it. She continues to work in work boots 12 hours a day. She cannot get  compression/4-layer under her work boots. 09/20/15 no major change. Periwound edema control was not very good. Her point with pain and vascular is next Wednesday the 25th 09/28/15; the patient is seen vascular surgery and is apparently scheduled for repeat duplex ultrasounds of her bilateral lower legs next week. 10/05/15; the patient was seen by Dr. Doren Custard of vascular surgery. He feels that she should have arterial insufficiency excluded as cause/contributed to her nonhealing stage she is therefore booked for an arteriogram. She has apparently monophasic signals in the dorsalis pedis pulses. She also of course has known severe chronic venous insufficiency with previous procedures as noted previously. I had another long discussion with the patient today about her continuing to work 12 hour shifts. I've written her out for 2 months area had concerns about this as her work location is currently undergoing significant turmoil and this may lead to her termination. She is aware of this however I agree with her that she simply cannot continue to stand for 12 hours multiple days a week with the substantial wound areas she has. 10/19/15; the Dr. Doren Custard appointment was largely for an arteriogram which was normal. She does not have an arterial issue. He didn't make a comment about her chronic venous insufficiency for which she has had previous ablations. Presumably it was not felt that anything additional could be done. The patient is now out of work as I prescribed 2 weeks ago. Her wounds look somewhat less aggravated presumably because of this. I felt I would give debridement another try today 10/25/15; no major change in this patient's wounds. We are struggling to get her product that she can afford into her own home through her insurance. 11/01/15; no major change in the patient's wounds. I have been using silver alginate as the most affordable product. I spoke to Dr. Marla Roe last week with her requested take her  to the OR for surgical debridement and placement of ACEL. Dr. Marla Roe told me that she would be willing to do this however [facility] will not cover this, fortunately the patient has [place] healthcare of some variant 11/08/15; no major change in the patient's wounds. She has been completely nonviable surface that this but is in too much pain with any attempted debridement are clinic. I have arranged for her to see Dr. Marla Roe ham of plastic surgery and this appointment is on Monday. I am hopeful that they will take her to the OR for debridement, possible ACEL ultimately possible skin graft 11/22/15 no major change in the patient's wounds over her bilateral medial calcaneus medial malleolus into the lower legs. Surface on these does not look too bad however on the left there is surrounding erythema and tenderness. This may be cellulitis or could him sleepy tinea. 11/29/15; no major changes in the patient's wounds over her bilateral medial malleolus. There is no infection here and I don't think any additional antibiotics are necessary. There is now plan to move forward. She sees Dr. Marla Roe in a week's time for preparation for operative debridement and ACEL placement I believe on 7/12. She then has a  follow-up appointment with Dr. Marla Roe on 7/21 12/28/15; the patient returns today having been taken to the [facility] by Dr. Marla Roe 12/12/15 she underwent debridement, intraoperative cultures [which were negative]. She had placement of a wound VAC. Parent really ACEL was not available to be placed. The wound VAC foam apparently adhered to the wound since then she's been using silver alginate, Xeroform under Ace wraps. She still says there is a lot of drainage and a lot of pain 01/31/16; this is a patient I see monthly. I had referred her to Dr. Marla Roe him of plastic surgery for large wounds on her bilateral medial ankles. She has been to the OR twice once in early July and once in early  August. She tells me over the last 3 weeks she has been using the wound VAC with ACEL underneath it. On the right we've simply been using silver alginate. Under Kerlix Coban wraps. 02/28/16; this is a patient I'm currently seeing monthly. She is gone on to have a skin graft over her large venous insufficiency ulcer on the left medial ankle. This was done by Dr. Marla Roe him. The patient is a bit perturbed about why she didn't have one on her right medial ankle wound. She has been using silver alginate to this. 03/06/16; I received a phone call from her plastic surgery Dr. Marla Roe. She expressed some concern about the viability of the skin graft she did on the left medial ankle wound. Asked me to place Endoform on this. She told me she is not planning to do a subsequent skin graft on the right as the left one did not take very well. I had placed Hydrofera Blue on the right 03/13/16; continue to have a reasonably healthy wound on the right medial ankle. Down to 3 mm in terms of size. There is epithelialization here. The area on the left medial ankle is her skin graft site. I suppose the last week this looks somewhat better. She has an open area inferiorly however in the center there appears to be some viable tissue. There is a lot of surface callus and eschar that will eventually need to come off however none of this looked to be infected. Patient states that the is able to keep the dressing on for several days which is an improvement. 03/20/16 no major change in the circumference of either wound however on the left side the patient was at Dr. Eusebio Friendly office and they did a debridement of left wound. 50% of the wound seems to be epithelialized. I been using Endoform on the left Hydrofera Blue in the right 03/27/16; she arrives today with her wound is not looking as healthy as they did last week. The area on the right clearly has an adherent surface to this a very similar surface on the left.  Unfortunately for this patient this is all too familiar problem. Clearly the Endoform is not working and will need to change that today that has some potential to help this surface. She does not tolerate debridement in this clinic very well. She is changing the dressing wants 04/03/16; patient arrives with the wounds looking somewhat better especially on the right. Dr. Migdalia Dk change the dressing to silver alginate when she saw her on Monday and also sold her some compression socks. The usefulness of the latter is really not clear and woman with severely draining wounds. 04/10/16; the patient is doing a bit of an experiment wearing the compression stockings that Dr. Migdalia Dk provided her to her left leg  and the out of legs based dressings that we provided to the right. 05/01/16; the patient is continuing to wear compression stockings Dr. Migdalia Dk provided her on the left that are apparently silver impregnated. She has been using Iodoflex to the right leg wound. Still a moderate amount of drainage, when she leaves here the wraps only last for 4 days. She has to change the stocking on the left leg every night 05/15/16; she is now using compression stockings bilaterally provided by Dr. Marla Roe. She is wearing a nonadherent layer over the wounds so really I don't think there is anything specific being done to this now. She has some reduction on the left wound. The right is stable. I think all healing here is being done without a specific dressing 06/09/16; patient arrives here today with not much change in the wound certainly in diameter to large circular wounds over the medial aspect of her ankle bilaterally. Under the light of these services are certainly not viable for healing. There is no evidence of surrounding infection. She is wearing compression stockings with some sort of silver impregnation as prescribed by Dr. Marla Roe. She has a follow-up with her tomorrow. 06/30/16; no major change in the size or  condition of her wounds. These are still probably covered with a nonviable surface. She is using only her purchase stockings. She did see Dr. Marla Roe who seemed to want to apply Dakin's solution to this I'm not extreme short what value this would be. I would suggest Iodoflex which she still has at home. 07/28/16; I follow [name] episodically along with Dr. Marla Roe. 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 She has been applying a topical collagen ointment to both wounds with Adaptic. I don't think Dr. Marla Roe is planning to take her back to the OR. 08/19/16; I follow [name] on a monthly basis along with Dr. Marla Roe of plastic surgery. 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 I been following her for a number of years. At one point I was able to get the right medial malleolus wound to heal and had the left medial malleolus down to about half its current size however and I had to send her to plastic surgery for an operative debridement. Since then things have been stable to slightly improve the area on the right is slightly better one in the left about the same although there is much less adherent surface than I'm used to with this patient. She is using some form of liquid collagen gel that Dr. Marla Roe provided a Kerlix cover with the patient's own pressure stockings. She tells me that she has extreme pain in both ankles and along the lateral aspect of both feet. She has been unable to work for some period of time. She is telling me she is retiring at the beginning of April. She sees Dr. Doran Durand of orthopedics next week 09/22/16; patient has not seen Dr. Marla Roe since the last time she is here. I'm not really sure what she is using to the wounds other than bits and pieces of think she had left over including most recently Hydrofera Blue. She is using  juxtalite stockings. She is having difficulty with her husband's recent illness "stroke". She is having to transport him to various doctors appointments. Dr. Marla Roe left her the option of a repeat debridement with ACEL however she has not been able to get the time  to follow-up on this. She continues to have a fair amount of drainage out of these wounds with certainly precludes leaving dressings on all week 10/13/16; patient has not seen Dr. Marla Roe since she was last in our clinic. I'm not really sure what she is doing with the wounds, we did try to get her [facility] and I think she is actually using this most of the time. Because of drainage she states she has to change this every second day although this is an improvement from what she used to do. She went to see Dr. Doran Durand who did not think she had a muscular issue with regards to her feet, he referred her to a neurologist and I think the appointment is sometime in June. I changed her back to Iodoflex which she has used in the past but not recently. 11/03/16; the patient has been using Iodoflex although she ran out of this. Still claims that there is a lot of drainage although the wound does not look like this. No surrounding erythema. She has not been back to see Dr. Marla Roe 11/24/16; the patient has been using Iodoflex again but she ran out of it 2 or 3 days ago. There is no major change in the condition of either one of these wounds in fact they are larger and covered in a thick adherent surface slough/nonviable tissue especially on the left. She does not tolerate mechanical debridement in our clinic. Going back to see Dr. Marla Roe of plastic surgery for an operative debridement would seem reasonable. 12/15/16; the patient has not been back to see Dr. Marla Roe. She is been dealing with a series of illnesses and her husband which of monopolized her time. She is been using Sorbact which we largely supplied. She states the drainage is  bad enough that it maximum she can go 2-3 days without changing the dressing 01/12/2017 -- the patient has not been back for about 4 weeks and has not seen Dr. Marla Roe not does she have any appointment pending. 01/23/17; patient has not seen Dr. Marla Roe even though I suggested this previously. She is using Santyl that was suggested last week by Dr. Con Memos this Cost her $16 through her insurance which is indeed surprising 02/12/17; continuing Santyl and the patient is changing this daily. A lot of drainage. She has not been back to see plastic surgery she is using an Ace wrap. Our intake nurse suggested wrap around stockings which would make a good reasonable alternative 02/26/17; patient is been using Santyl and changing this daily due to drainage. She has not been to see plastic surgery she uses in April Ace wrap to control the edema. She did obtain extremitease stockings but stated that the edema in her leg was to big for these 03/20/17; patient is using Santyl and Anasept. Surfaces looked better today the area on the right is actually measuring a little smaller. She has states she has a lot of pain in her feet and ankles and is asking for a consult to pain control which I'll try to help her with through our case manager. 04/10/17; the patient arrives with better-looking wound surfaces and is slightly smaller wound on the left she is using a combination of Santyl and Anasept. She has an appointment or at least as started in the pain control center associated with [facility] [place] 05/14/17; this is a patient who I followed for a prolonged period of time. 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 At one point I  had this down to a much smaller wound on the left however these reopened and we've never been able to get these to heal. She has been using Santyl and Anasept gel although 2 weeks ago she ran out of the Anasept gel. She has a stable appearance of the wound. She is  going to the wound care clinic at [facility]. They wanted do a nerve block/spinal block although she tells me she is reluctant to go forward with that. 05/21/17; this is a patient I have followed for many years. 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 Chronic pain and deformity in her ankles as well. She is been to see plastic surgery as well as orthopedics. Using PolyMem AG most recently/Kerramax/ABDs and 2 layer compression. She has managed to keep this on and she is coming in for a nurse check to change the dressing on Tuesdays, we see her on Fridays 06/05/17; really quite a good looking surface and the area especially on the right medial has contracted in terms of dimensions. Well granulated healthy-looking tissue on both sides. Even with an open curet there is nothing that even feels abnormal here. This is as good as I've seen this in quite some time. We have been using PolyMem AG and bringing her in for a nurse check 06/12/17; really quite good surface on both of these wounds. The right medial has contracted a bit left is not. We've been using PolyMem and AG and she is coming in for a nurse visit 06/19/17; we have been using PolyMem AG and bringing her in for a nurse check. Dimensions of her wounds are not better but the surfaces looked better bilaterally. She complained of bleeding last night and the left wound and increasing pain bilaterally. She states her wound pain is more neuropathic than just the wounds. There was some suggestion that this was radicular from her pain management doctor in talking to her it is really difficult to sort this out. 06/26/17; using PolyMem and AG and bringing her in for a nurse check as All of this and reasonably stable condition. Certainly not improved. The dimensions on the lateral part of the right leg look better but not really measuring better. The medial aspect on the left is about the same. 07/03/16; we have been using PolyMen  AG and bringing her in for a nurse check to change the dressings as the wounds have drainage which precludes once weekly changing. We are using all secondary absorptive dressings.our intake nurse is brought up the idea of using a wound VAC/snap VAC on the wound to help with the drainage to see if this would result in some contraction. This is not a bad idea. The area on the right medial is actually looking smaller. Both wounds have a reasonable-looking surface. There is no evidence of cellulitis. The edema is well controlled 07/10/17; the patient was denied for a snap VAC by her insurance. The major issue with these wounds continues to be drainage. We are using wicked PolyMem AG and she is coming in for a nurse visit to change this. The wounds are stable to slightly improved. The surface looks vibrant and the area on the right certainly has shrunk in size but very slowly 07/17/17; the patient still has large wounds on her bilateral medial malleoli. Surface of both of these wounds looks better. The dimensions seem to come and go but no consistent improvement. There is no epithelialization. We do not have options for advanced treatment products  due to insurance issues. They did not approve of the wound VAC to help control the drainage. More recently we've been using PolyMem and AG wicked to allow drainage through. We have been bringing her in for a nurse visit to change this. We do not have a lot of options for wound care products and the home again due to insurance issues 07/24/17; the patient's wound actually looks somewhat better today. No drainage measurements are smaller still healthy-looking surface. We used silver collagen under PolyMen started last week. We have been bringing her in for a dressing change 07/31/17; patient's wound surface continued to look better and I think there is visible change in the dimensions of the wound on the right. Rims of epithelialization. We have been using silver collagen  under PolyMen and bringing her in for a dressing change. There appears to be less drainage although she is still in need of the dressing change 08/07/17. Patient's wound surface continues to look better on both sides and the area on the right is definitely smaller. We have been using silver collagen and PolyMen. She feels that the drainage has been it has been better. I asked her about her vascular status. She went to see Dr. Aleda Grana at [facility] and had some form of ablation. I don't have much detail on this. I haven't my notes from 2016 that she was not a candidate for any further ablation but I don't have any more information on this. We had referred her to vein and vascular I don't think she ever went. He does not have a history of PAD although I don't have any information on this either. We don't even have ABIs in our record 08/14/17; we've been using silver collagen and PolyMen cover. And putting the patient and compression. She we are bringing her in as a nurse visit to change this because ofarge amount of drainage. We didn't the ABIs in clinic today since they had been done in many moons 1.2 bilaterally. She has been to see vein and vascular however this was at [facility] and she had ablation although I really don't have any information on this all seemed biking get a report. She is also been operatively debrided by plastic surgery and had a cell placed probably 8-12 months ago. This didn't have a major effect. We've been making some gains with current dressings 08/19/17-She is here in follow-up evaluation for bilateral medial malleoli ulcers. She continues to tolerate debridement very poorly. We will continue with recently changed topical treatment; if no significant improvement may consider switching to Iodosorb/Iodoflex. 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 She has been using silver collagen and PolyMem. I believe she has been  used and tried on Iodoflex before. 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 These have been very refractory over time. We have been using silver collagen and PolyMen. She is coming in once a week for a doctor's and once a week for nurse visits. We are actually making some progress 09/18/17; the patient's wounds are smaller especially on the right medial. She arrives today to upset to consider even washing these off with Anasept which I think is been part of the reason this is been closing. We've been using collagen covered in PolyMen otherwise. It is noted that  she has a small area of folliculitis on the right medial calf that. As we are wrapping her legs I'll give her a short course of doxycycline to make sure this doesn't amount to anything. She is a long list of complaints today including imbalance, shortness of breath on exertion, inversion of her left ankle. With regards to the latter complaints she is been to see orthopedics and they offered her a tendon release surgery I believe but wanted her wounds to be closed first. I have recommended she go see her primary physician with regards to everything else. 09/25/17; patient's wounds are about the same size. We have made some progress bilaterally although not in recent weeks. She will not allow me T wash these wounds with Anasept even if she is doing her cell. Wheeze we've been using collagen covered in PolyMen. Last week she had a small area of folliculitis this is now opened into a small wound. She completed 5 days of trimethoprim sulfamethoxazole 10/02/17; unfortunately the area on her left medial ankle is worse with a larger wound area towards the Achilles. The patient complains of a lot of pain. She will not allow debridement although visually I don't  think there is anything to debridement in any case. We have been using silver collagen and PolyMen for several months now. Initially we are making some progress although I'm not really seeing that today. We will move back to [facility]. His admittedly this is a bit of a repeat however I'm hoping that his situation is different now. The patient tells me she had her leg on the left give out on her yesterday this is process some pain. 10/09/17; the patient is seen twice a week largely because of drainage issues coming out of the chronic medial bimalleolar wounds that are chronic. Last week the dimensions of the one on the left looks a little larger I changed her to [facility]. She comes in today with a history of terrible pain in the bilateral wound areas. She will not allow debridement. She will not even allow a tissue culture. There is no surrounding erythema no no evidence of cellulitis. We have been putting her Kerlix Coban man. She will not allow more aggressive compression as there was a suggestion to put her in 3 layer wraps. 10/16/17; large wounds on her bilateral medial malleoli. These are chronic. Not much change from last week. The surface looks have healthy but absolutely no epithelialization. A lot of pain little less so of drainage. She will not allow debridement or even washing these off in the vigorous fashion with Anasept. 10/23/17; large wounds on her bilateral malleoli which are chronic. Some improvement in terms of size perhaps on the right since last time I saw these. She states that after we increased the 3 layer compression there was some bleeding, when she came in for a nurse visit she did not want 3 layer compression put back on about our nurse managed to convince her. She has known chronic venous visit issues and I'm hoping to get her to tolerate the 3 layer compression. using Hydrofera Blue 10/30/17; absolutely no change in the condition of either wound although we've had  some improvement in dimensions on the right.. Attempted to put her in 3 layer compression she didn't tolerated she is back in 2 layer compression. We've been using Hydrofera Blue We looked over her past records. She had venous reflux studies in November 2016. There was no evidence of deep venous reflux on the  right. Superficial vein did not show the greater saphenous vein at think this is been previously ablated the small saphenous vein was within normal limits. The left deep venous system showed no DVT the vessels were positive for deep venous reflux in the posterior tibial veins at the ankle. The greater saphenous vein was surgically absent small saphenous vein was within normal limits. She went to vein and vascular at [facility]. I believe she had an ablation on the left greater saphenous vein. I'll update her reflux studies perhaps ever reviewed by vein and vascular. We've made absolutely no progress in these wounds. Will also try to read and TheraSkins through her insurance 11/06/17; W the patient apparently has a 2 week follow-up with vein and vascular I like him to review the whole issue with regards to her previous vascular workup by Dr. Aleda Grana. We've really made no progress on these wounds in many months. She arrives today with less viable looking surface on the left medial ankle wound. This was apparently looking about the same on Tuesday when she was here for nurse visit. 11/13/17; deep tissue culture I did last time of the left lower leg showed multiple organisms without any predominating. In particular no Staphylococcus or group A strep were isolated. We sent her for venous reflux studies. She's had a previous left greater saphenous vein stripping and I think sclerotherapy of the right greater saphenous vein. She didn't really look at the lesser saphenous vein this both wounds are on the medial aspect. She has reflux in the common femoral vein and popliteal vein and an accessory  vein on the right and the common femoral vein and popliteal vein on the left. I'm going to have her go to see vein and vascular just the look over things and see if anything else beside aggressive compression is indicated here. We have not been able to make any progress on these wounds in spite of the fact that the surface of the wounds is never look too bad. 11/20/17; no major change in the condition of the wounds. Patient reports a large amount of drainage. She has a lot of complaints of pain although enlisting her today I wonder if some of this at least his neuropathic rather than secondary to her wounds. She has an appointment with vein and vascular on 12/30/17. The refractory nature of these wounds in my mind at least need vein and vascular to look over the wounds the recent reflux studies we did and her history to see if anything further can be done here. I also note her gait is deteriorated quite a bit. 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 She has a bilateral Trendelenburg gait. I wonder if this is neuropathic or perhaps multilevel radicular. 11/27/17; her wounds actually looks slightly better. Healthy-looking granulation tissue a scant amount of epithelialization. [place] healthcare will not pay for [employer]. They will play for tri layer Oasis and Dermagraft. This is not a diabetic ulcer. We'll try for the tri layer Oasis. She still complains of some drainage. She has a vein and vascular appointment on 12/30/17 12/04/17; the wounds visually look quite good. Healthy-looking granulation with some degree of epithelialization. We are still waiting for response to our request for trial to try layer Oasis. Her appointment with vascular to review venous and arterial issues isn't sold the end of July 7/31. Not allow debridement or even vigorous cleansing of the wound surface. 12/18/17; slightly smaller especially on the right. Both wounds have  epithelialization superiorly some hyper  granulation. We've been using Hydrofera Blue. We still are looking into triple layer Oasis through her insurance 01/08/18 on evaluation today patient's wound actually appears to be showing signs of good improvement at this point in time. She has been tolerating the dressing changes without complication. Fortunately there does not appear to be any evidence of infection at this point in time. We have been utilizing silver nitrate which does seem to be of benefit for her which is also good news. Overall I'm very happy with how things seem to be both regards appearance as well as measurement. Patient did see Dr. Bridgett Larsson for evaluation on 12/30/17. In his assessment he felt that stripping would not likely add much more than chronic compression to the patient's healing process. His recommendation was to follow-up in three months with Dr. Doren Custard if she hasn't healed in order to consider referral back to you and see vascular where she previously was in a trial and was able to get her wound to heal. I'll be see what she feels she when you staying compression and he reiterated this as well. 01/13/18 on evaluation today patient appears to actually be doing very well in regard to her bilateral medial malleolus ulcers. She seems to have tolerated the chemical cauterization with silver nitrate last week she did have some pain through that evening but fortunately states that I'll be see since it seems to be doing better she is overall pleased with the progress. 01/21/18; really quite a remarkable improvement since I've last seen these wounds. We started using silver nitrate specially on the islands of hyper granulation which for some reason her around the wound circumference. This is really done quite nicely. Primary dressing Hydrofera Blue under 4 layer compression. She seems to be able to hold out without a nurse rewrap. Follow-up in 1 week 01/28/18; we've continued the hydrofera blue but continued with chemical  cauterization to the wound area that we started about a month ago for irregular hyper granulation. She is made almost stunning improvement in the overall wound dimensions. I was not really expecting this degree of improvement in these chronic wounds 02/05/18; we continue with Hydrofera Bluebut of also continued the aggressive chemical cauterization with silver nitrate. We made nice progress with the right greater than left wound. 02/12/18. We continued with Hydrofera Blue after aggressive chemical cauterization with silver nitrate. We appear to be making nice progress with both wound areas 02/19/2018; we continue with [facility] after washing the wounds vigorously with Anasept spray and chemical cauterization with silver nitrate. We are making excellent progress. The area on the right's just about closed 02/26/2018. The area on the left medial ankle had too much necrotic debris today. I used a #5 curette we are able to get most of the soft. I continued with the silver nitrate to the much smaller wound on the right medial ankle she had a new area on her right lower pretibial area which she says was due to a role in her compression 03/05/2018; both wound areas look healthy. Not much change in dimensions from last week. I continue to use silver nitrate and Hydrofera Blue. The patient saw Dr. Doren Custard of vein and vascular. He felt she had venous stasis ulcers. He felt based on her previous arteriogram she should have adequate circulation for healing. Also she has deep venous reflux but really no significant correctable superficial venous reflux at this time. He felt we should continue with conservative management including leg elevation  and compression 04/02/2018; since we last saw this woman about a month ago she had a fall apparently suffered a pelvic fracture. I did not look up the x-ray. Nevertheless because of pain she literally was bedbound for 2 weeks and had home health coming out to change the  dressing. Somewhat predictably this is resulted in considerable improvement in both wound areas. The right is just about closed on the medial malleolus and the left is about half the size. 04/16/2018; both her wounds continue to go down in size. Using Hydrofera Blue. 05/07/18; both her wounds appeared to be improving especially on the right where it is almost closed. We are using Hydrofera Blue 05/14/2018; slightly worse this week with larger wounds. Surface on the left medial not quite as good. We have been using Hydrofera Blue 05/21/18; again the wounds are slightly larger. Left medial malleolus slightly larger with eschar around the circumference. We have been using Hydrofera Blue undergoing a wraps for a prolonged period of time. This got a lot better when she was more recumbent due to a fall and a back injury. I change the primary dressing the silver alginate today. She did not tolerate a 4 layer compression previously although I may need to bring this up with her next time 05/28/2018; area on the left medial malleolus again is slightly larger with more drainage. Area on the right is roughly unchanged. She has a small area of folliculitis on the right medial just on the lower calf. This does not look ominous. 06/03/2018 left medial malleolus slightly smaller in a better looking surface. We used silver nitrate on this last time with silver alginate. The area on the right appears slightly smaller 1/10; left medial malleolus slightly smaller. Small open area on the right. We used silver nitrate and silver alginate as of 2 weeks ago. We continue with the wound and compression. These got a lot better when she was off her feet 1/17; right medial malleolus wound is smaller. The left may be slightly smaller. Both surfaces look somewhat better. 1/24; both wounds are slightly smaller. Using silver alginate under Unna boots 1/31; both wounds appear smaller in fact the area on the right medial is just about  closed. Surface eschar. We have been using silver alginate under Unna boots. The patient is less active now spends let much less time on her feet and I think this is contributed to the general improvement in the wound condition 2/7; both wounds appear smaller. I was hopeful the right medial would be closed however there there is still the same small open area. Slight amount of surface eschar on the left the dimensions are smaller there is eschar but the wound edges appear to be free. We have been using silver alginate under Unna boot's 2/14; both wounds once again measure smaller. Circumferential eschar on the left medial. We have been using silver alginate under Unna boots with gradual improvement 2/21; the area on the right medial malleolus has healed. The area on the left is smaller. We have been using silver alginate and Unna boots. We can discharge wrapping the right leg she has 20/30 stockings at home she will need to protect the scar tissue in this area 2/28; the area on the right medial malleolus remains closed the patient has a compression stocking. The area on the left is smaller. We have been using silver alginate and Unna boots. 3/6 the area on the right medial ankle remains closed. Good edema control noted she is using  her own compression stocking. The area on the left medial ankle is smaller. We have been managing this with silver alginate and Unna boots which we will continue today. 3/13; the area on the right medial ankle remains closed and I'm declaring it healed today. When necessary the left is about the same still a healthy-looking surface but no major change and wound area. No evidence of infection and using silver alginate under unna and generally making considerable improvement 3/27 the area on the right medial ankle remains closed the area on the left is about the same as last week. Certainly not any worse we have been using silver alginate under an Unna boot 4/3; the area on  the right medial ankle remains closed per the patient. We did not look at this wound. The wound on the left medial ankle is about the same surface looks healthy we have been using silver alginate under an Unna boot 4/10; area on the right medial ankle remains closed per the patient. We did not look at this wound. The wound on the left medial ankle is slightly larger. The patient complains that the [facility] caused burning pain all week. She also told us that she was a lot more active this week. Changed her back to silver alginate 4/17; right medial ankle still closed per the patient. Left medial ankle is slightly larger. Using silver alginate. She did not tolerate Hydrofera Blue on this area 4/24; right medial ankle remains closed we have not look at this. The left medial ankle continues to get larger today by about a centimeter. We have been using silver alginate under Unna boots. She complains about 4 layer compression as an alternative. She has been up on her feet working on her garden 5/8; right medial ankle remains closed we did not look at this. The left medial ankle has increased in size about 100%. We have been using silver alginate under Unna boots. She noted increased pain this week and was not surprised that the wound is deteriorated 5/15; no major change in SA however much less erythema ( one week of doxy ocellulitis). 5/22-63 year old female returns at 1 week to the clinic for left medial ankle wound for which we have been using silver alginate under 3 layer compression She was placed on DOXY at last visit - the wound is wider at this visit. She is in 3 layer compression 5/29; change to [facility] last week. I had given her empiric doxycycline 2 weeks ago for a week. She is in 3 layer compression. She complains of a lot of pain and drainage on presentation today. 6/5; using Hydrofera Blue. I gave her doxycycline recently empirically for erythema and pain around the wound. Believe  her cultures showed enterococcus which not would not have been well covered by doxycycline nevertheless the wound looks better and I don't feel specifically that the enterococcus needs to be covered. She has a new what looks like a wrap injury on her lateral left ankle. 6/12; she is using Hydrofera Blue. She has a new area on the left anterior lower tibial area. This was a wrap injury last week. 6/19; the patient is using Hydrofera Blue. She arrived with marked inflammation and erythema around the wound and tenderness. 12/01/18 on evaluation today patient appears to be doing a little bit better based on what I'm hearing from the standpoint of lassos evaluation to this as far as the overall appearance of the wound is concerned. Then sometime substandard she typically sees  Dr. Dellia Nims. Nonetheless overall very pleased with the progress that she's made up to this point. No fevers, chills, nausea, or vomiting noted at this time. 7/10; some improvement in the surface area. Aggressively debrided last week apparently. I went ahead with the debridement today although the patient does not tolerate this very well. We have been using Iodoflex. Still a fair amount of drainage 7/17; slightly smaller. Using Iodoflex. 7/24; no change from last week in terms of surface area. We have been using Iodoflex. Surface looks and continues to look somewhat better 7/31; surface area slightly smaller better looking surface. We have been using Iodoflex. This is under Unna boot compression 8/7-Patient presents at 1 week with Unna boot and Iodoflex, wound appears better 8/14-Patient presents at 1 week with Iodoflex, we use the Unna boot, wound appears to be stable better.Patient is getting Botox treatment for the inversion of the foot for tendon release, Next week 8/21; we are using Iodoflex. Unna boot. The wound is stable in terms of surface area. Under illumination there is some areas of the wound that appear to be either  epithelialized or perhaps this is adherent slough at this point I was not really clear. It did not wipe off and I was reluctant to debride this today. 8/28; we are using Iodoflex in an Unna boot. Seems to be making good improvement. 9/4; using Iodoflex and wound is slightly smaller. 9/18; we are using Iodoflex with topical silver nitrate when she is here. The wound continues to be smaller 10/2; patient missed her appointment last week due to GI issues. She left and Iodoflex based dressing on for 2 weeks. Wound is about the same size about the size of a dime on the left medial lower 10/9 we have been using Iodoflex on the medial left ankle wound. She has a new superficial probable wrap injury on the dorsal left ankle 10/16; we have been using Hydrofera Blue since last week. This is on the left medial ankle 10/23; we have been using Hydrofera Blue since 2 weeks ago. This is on the left medial ankle. Dimensions are better 11/6; using Hydrofera Blue. I think the wound is smaller but still not closed. Left medial ankle 11/13; we have been using Hydrofera Blue. Wound is certainly no smaller this week. Also the surface not as good. This is the remanent of a very large area on her left medial ankle. 11/20; using Sorbact since last week. Wound was about the same in terms of size although I was disappointed about the surface debris 12/11; 3-week follow-up. Patient was on vacation. Wound is measuring slightly larger we have been using Sorbact. 12/18; wound is about the same size however surface looks better last week after debridement. We have been using Sorbact under compression 1/15 wound is probably twice the size of last time increased in length nonviable surface. We have been using Sorbact. She was running a mild fever and missed her appointment last week 1/22; the wound is come down in size but under illumination still a very adherent debris we have been Hydrofera Blue that I changed her to last  week 1/29; dimensions down slightly. We have been using Hydrofera Blue 2/19 dimensions are the same however there is rims of epithelialization under illumination. Therefore more the surface area may be epithelialized 2/26; the patient's wound actually measures smaller. The wound looks healthy. We have been using Hydrofera Blue. I had some thoughts about running Apligraf then I still may do that however this looks so  much better this week we will delay that for now 3/5; the wound is small but about the same as last week. We have been using Hydrofera Blue. No debridement is required today. 3/19; the wound is about the size of a dime. Healthy looking wound even under illumination. We have been using Hydrofera Blue. No mechanical debridement is necessary 3/26; not much change from last week although still looks very healthy. We have been using Hydrofera Blue under Unna boots Patient was offered an ankle fusion by podiatry but not until the wound heals with a proceed with this. 4/9; the patient comes in today with her original wound on the medial ankle looking satisfactory however she has some uncontrolled swelling in the middle part of her leg with 2 new open areas superiorly just lateral to the tibia. I think this was probably a wrap issue. She said she felt uncomfortable during the week but did not call in. We have been using Hydrofera Blue 4/16; the wound on the medial ankle is about the same. She has innumerable small areas superior to this across her mid tibia. I think this is probably folliculitis. She is also been working in the yard doing a lot of sweating 4/30; the patient issue on the upper areas across her mid tibia of all healed. I think this was excessive yard work if I remember. Her wound on the medial ankle is smaller. Some debris on this we have been using Hydrofera Blue under Unna boots 5/7; mid tibia. She has been using Hydrofera Blue under an Unna wrap. She is apparently going for her  ankle surgery on June 3 10/28/19-Patient returns to clinic with the ankle wound, we are using Hydrofera Blue under Unna wrap, surgery is scheduled for her left foot for June 3 so she will be back for nurse visit next week READMISSION 01/17/2020 [name] is a 64 year old woman we have had in this clinic for a long period of time with severe venous hypertension and refractory wounds on her medial lower legs and ankles bilaterally. 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 When she was off her legs for a prolonged period example when she fell and suffered a compression fracture things would heal up quite nicely. She is now retired and we managed to heal up the right medial leg wound. The left one was very tiny last time I saw this although still refractory. 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 It got to the point where this was interfering with her walking and she elected to proceed with a ankle arthrodesis to straighten her her ankle and leave her with a functional outcome for mobilization. The patient was referred to Dr. Doren Custard and really this took some time to arrange. Dr. Doren Custard saw her on 12/07/2019. Once again he verified that she had no arterial issues. She had previously had an angiogram several years ago. Follow-up ABIs on the left showed an ABI of 1.12 with triphasic waveforms and a TBI of 0.92. She is felt to have chronic deep venous insufficiency but I do not think it was felt that anything could be done from about this from an ablation point of view. At the time Dr. Doren Custard saw this patient the wounds actually look closed via the pictures in his clinic.  The patient finally underwent her surgery on 12/15/2019. This went reasonably well and there was a good anatomic outcome. She  developed a small distal wound dehiscence on the lateral part of the surgical wound. However more problematically she is developed recurrence of the wound on the medial left ankle. There are actually 2 wounds here one in the distal lower leg and 1 pretty much at the level of the medial malleolus. It is a more distal area that is more problematic. She has been using Hydrofera Blue which started on Friday before this she was simply Ace wrapping. There was a culture done that showed Pseudomonas and she is on ciprofloxacin. A recent CNS on 8/11 was negative. The patient reports some pain but I generally think this is improving. She is using a cam boot completely nonweightbearing using a walker for pivot transfers and a wheelchair 8/24; not much improvement unfortunately she has a surgical wound on the lateral part in the venous insufficiency wound medially. The bottom part of the medial insufficiency wound is still necrotic there is exposed tendon here. We have been using Hydrofera Blue under compression. Her edema control is however better 8/31; patient in for follow-up of his surgical wound on the lateral part of her left leg and chronic venous insufficiency ulcers medially. We put her back in compression last week. She comes in today with a complaint of 3 or 4 days worth of increasing pain. She felt her cam walker was rubbing on the area on the back of her heel. However there is intense erythema seems more likely she has cellulitis. She had 2 cultures done when she was seeing podiatry in the postop. One of them in late July showed Pseudomonas and she received a course of ciprofloxacin the other was negative on 8/11 she is allergic to penicillin with anaphylactoid complaints of hives oral swelling via information in epic 9/9; when I saw this patient last week she had intense anterior erythema around her wound on the right lateral heel and ankle and also into the right medial heel. Some of this was no  doubt drainage and her walker boot however I was convinced she had cellulitis. I gave her Levaquin and Bactrim she is finishing up on this now. She is following up with Dr. [name] he saw her yesterday. He is taken her out of the walking boot of course she is still nonweightbearing. Her x-ray was negative for any worrisome features such as soft tissue air etc. Things are a lot better this week. She has home health. We have been using Hydrofera Blue under an [employer] which she put back on yesterday. I did not wrap her last week 9/17; her surrounding skin looks a lot better. In fact the area on the left lateral ankle has just a scant amount of eschar. The only remaining wound is the large area on the left medial ankle. Probably about 60% of this is healthy granulation at the surface however she has a significant divot distally. This has adherent debris in it. I been using debridement and silver collagen to try and get this area to fill-in although I do not think we have made much progress this week 9/24; the patient's wound on the left medial ankle looks a lot better. The deeper divot area distally still requires debridement but this is cleaning up quite nicely we have been using silver collagen. The patient is complaining of swelling in her foot and is worried that that is contributing to the nonhealing  of the ankle wound. She is also complaining of numbness in her anterior toes 10/4; left medial ankle. The small area distally still has a divot with necrotic material that I have been debriding away. This has an undermining area. She is approved for Apligraf. She saw Dr. [name] her surgeon on 10/1. I think he declared himself is satisfied with the condition of things. Still nonweightbearing till the end of the month. 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 Her surgical wound is well closed. There is no evidence of infection 10/11; the patient arrived in clinic today with  the expectation that we be able to put an Apligraf on this area after debridement however she arrives with a relatively large amount of green drainage on the dressing. The patient states that this started on Friday. She has not been systemically unwell. 10/19; culture I did last week showed both Enterococcus and Pseudomonas. I think this came in separate parts because I stopped her ciprofloxacin I gave her and prescribed her linezolid however now looking at the final culture result this is Pseudomonas which is resistant to quinolones. She has not yet picked up the linezolid apparently phone issues. We are also trying to get a topical antibiotic out of [facility] in [place] they can be applied by home health. She is still having green drainage 10/16; the patient has her topical antibiotic from [facility] in [place]. This is a compounded gel with vancomycin and ciprofloxacin and gentamicin. We are applying this on the wound bed with silver alginate over the top with Unna boot wraps. She arrives in clinic today with a lot less ominous looking drainage although she is only use this topical preparation once the second time today. She sees Dr. [name] her surgeon on Friday she has home health changing the dressing 11/2; still using her compounded topical antibiotic under silver alginate. Surface is cleaning up there is less drainage. We had an Apligraf for her today and I elected to apply it. A light coating of her antibiotic 04/25/2020 upon evaluation today patient appears to be doing well with regard to her ankle ulcer. There is a little bit of slough noted on the surface of the wound I am can have to perform sharp debridement to clear this away today. With that being said other than that fact overall I feel like she is making progress and we do see some new epithelial growth. There is also some improvement in the depth of the wound and that distal portion. There is little bit of slough there  as well. 12/7; 2-week follow-up. Apligraf #3. Dimensions are smaller. Closing in especially inferiorly. Still some surface debris. Still using the [facility] topical antibiotic but I told her that I don't think this needs to be renewed Electronic Signature(s) Signed: 05/08/2020 5:06:48 PM By: Linton Ham MD Entered By: Linton Ham on 05/08/2020 09:53:07 -------------------------------------------------------------------------------- Physical Exam Details Patient Name: Date of Service: [name], [name] 05/08/2020 9:15 A M Medical Record Number: [id number] Patient Account Number: [id number] Date of Birth/Sex: Treating RN: 12-26-1955 (64 y.o. [name] Primary Care Provider: [name] Other Clinician: Referring Provider: Treating Provider/Extender: [name] in Treatment: 16 Constitutional Sitting or standing Blood Pressure is within target range for patient.. Pulse regular and within target range for patient.Marland Kitchen Respirations regular, non-labored and within target range.. Temperature is normal and within the target range for the patient.Marland Kitchen Appears in no distress. Notes Wound exam; dimensions are  smaller. There is epithelialization inferiorly. Still some depth. I used a #5 curette to remove surface slough from the remaining part of the wound and then applied Apligraf #3 in the standard fashion. No evidence of surrounding infection Electronic Signature(s) Signed: 05/08/2020 5:06:48 PM By: Linton Ham MD Entered By: Linton Ham on 05/08/2020 09:55:48 -------------------------------------------------------------------------------- Physician Orders Details Patient Name: Date of Service: [name], [name] 05/08/2020 9:15 A M Medical Record Number: [id number] Patient Account Number: [id number] Date of Birth/Sex: Treating RN: 30-Nov-1955 (64 y.o. 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 Cellular or Tissue Based Products Cellular or Tissue Based Product Type: - apli graft #3 daptic or Mepitel. (DO NOT REMOVE). - home Cellular or Tissue Based Product applied to wound bed, secured with steri-strips, cover with A may change outter dressing only every other week, patient seen at wound center every other week Bathing/ Shower/ Hygiene May shower with protection but do not get wound dressing(s) wet. Edema Control - Lymphedema / SCD / Other Elevate legs to the level of the heart or above for 30 minutes daily and/or when sitting, a frequency of: Avoid standing for long periods of time. Exercise regularly [place] wound care orders this week; continue Home Health for wound care. May utilize formulary equivalent dressing for wound treatment orders unless otherwise specified. Other Home Health Orders/Instructions: - [facility] Wound Treatment Wound #15 - Malleolus Wound Laterality: Left, Medial Prim Dressing: apligraf Other/Other:every other week ary Discharge Instructions: applied by MD : DO NOT REMOVE Secondary Dressing: Woven Gauze Sponge, Non-Sterile 4x4 in (Home Health) 1 x Per Week/15 Days Discharge Instructions: Apply over primary dressing as directed. Secondary Dressing: ABD Pad, 5x9 (Home Health) 1 x Per Week/15 Days Discharge Instructions: Apply over primary dressing as directed. Secondary Dressing: Drawtex 4x4 in [facility]) (Generic) 1 x Per Week/15 Days Discharge  Instructions: Apply over primary dressing as directed. Compression Wrap: Unnaboot w/Calamine, 4x10 (in/yd) (Home Health) 1 x Per Week/15 Days Discharge Instructions: Apply Unnaboot as directed. Electronic Signature(s) Signed: 05/08/2020 5:06:48 PM By: Linton Ham MD Signed: 05/08/2020 5:28:36 PM By: [name] Coria RN Entered By: [name] Coria on 05/08/2020 09:55:19 -------------------------------------------------------------------------------- Problem List Details Patient Name: Date of Service: [facility] [name], [name] 05/08/2020 9:15 A M Medical Record Number: [id number] Patient Account Number: [id number] Date of Birth/Sex: Treating RN: 06/16/1955 (64 y.o. 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 01/17/2020 No Yes Inactive Problems ICD-10 Code Description Active Date Inactive Date L03.116 Cellulitis of left lower limb 01/31/2020 01/31/2020 T81.31XD Disruption of external operation (surgical) wound, not elsewhere classified, subsequent 01/17/2020 01/17/2020 encounter Resolved Problems Electronic Signature(s) Signed: 05/08/2020 5:06:48 PM By: Linton Ham MD  Entered By: Linton Ham on 05/08/2020 09:50:55 -------------------------------------------------------------------------------- Progress Note Details Patient Name: Date of Service: [facility], [name] 05/08/2020 9:15 A M Medical Record Number: [id number] Patient Account Number: [id number] Date of Birth/Sex: Treating  RN: November 10, 1955 (64 y.o. [name] Primary Care Provider: [name] Other Clinician: Referring Provider: Treating Provider/Extender: [name] in Treatment: 16 Subjective History of Present Illness (HPI) the remaining wound is over the left medial ankle. Similar wound over the right medial ankle healed largely with use of Apligraf. Most recently we have been using Hydrofera Blue over this wound with considerable improvement. The patient has been extensively worked up in the past for her venous insufficiency and she is not a candidate for antireflux surgery although I have none of the details available currently. 08/24/14; considerable improvement today. About 50% of this wound areas now epithelialized. The base of the wound appears to be healthier granulation.as opposed to last week when she had deteriorated a considerable improvement 08/17/14; unfortunately the wound has regressed somewhat. The areas of epithelialization from the superior aspect are not nearly as healthy as they were last week. The patient thinks her Hydrofera Blue slipped. 09/07/14; unfortunately the area has markedly regressed in the 2 weeks since I've seen this. There is an odor surrounding erythema. The healthy granulation tissue that we had at the base of the wound now is a dusky color. The nurse reports green drainage 09/14/14; the area looks somewhat better than last week. There is less erythema and less drainage. The culture I did did not show any growth. Nevertheless I think it is better to continue the Cipro and doxycycline for a further week. The remaining wound area was debridement. 09/21/14. Wound did not require debridement last week. Still less erythema and less drainage. She can complete her antibiotics. The areas of epithelialization in the superior aspect of the wound do not look as healthy as they did some weeks ago 10/05/14 continued improvement in the condition of this wound.  There is advancing epithelialization. Less aggressive debridement required 10/19/14 continued improvement in the condition and volume of this wound. Less aggressive debridement to the inferior part of this to remove surface slough and fibrinous eschar 11/02/14 no debridement is required. The surface granulation appears healthy although some of her islands of epithelialization seem to have regressed. No evidence of infection 11/16/14; lites surface debridement done of surface eschar. The wound does not look to be unhealthy. No evidence of infection. Unfortunately the patient has had podiatry issues in the right foot and for some reason has redeveloped small surface ulcerations in the medial right ankle. Her original presentation involved wounds in this area 11/23/14 no debridement. The area on the right ankle has enlarged. The left ankle wound appears stable in terms of the surface although there is periwound inflammation. There has been regression in the amount of new skin 11/30/14 no debridement. Both wound areas appear healthy. There was no evidence of infection. The the new area on the right medial ankle has enlarged although that both the surfaces appear to be stable. 12/07/14; Debridement of the right medial ankle wound. No no debridement was done on the left. 12/14/14 no major change in and now bilateral medial ankle wounds. Both of these are very painful but the no overt evidence of infection. She has had previous venous ablation 12/21/14; patient states that her right medial ankle wound is considerably more painful last week than usual. Her left is also somewhat painful. She  could not tolerate debridement. The right medial ankle wound has fibrinous surface eschar 12/28/14 this is a patient with severe bilateral venous insufficiency ulcers. For a considerable period of time we actually had the one on the right medial ankle healed however this recently opened up again in June. The left medial ankle  wound has been a refractory area with some absent flows. We had some success with Hydrofera Blue on this area and it literally closed by 50% however it is recently opened up Foley. Both of these were debridement today of surface eschar. She tolerates this poorly 01/25/15: No change in the status of this. Thick adherent escar. Very poor tolerance of any attempt at debridement. I had healed the right medial malleolus wound for a considerable amount of time and had the left one down to about 50% of the volume although this is totally regressed over the last 48 weeks. Further the right leg has reopened. she is trying to make a appointment with pain and vascular, previous ablations with Dr. Aleda Grana. I do not believe there is an arterial insufficiency issue here 02/01/15 the status of the adherent eschar bilaterally is actually improved. No debridement was done. She did not manage to get vascular studies done 02/08/15 continued debridement of the area was done today. The slough is less adherent and comes off with less pressure. There is no surrounding infection peripheral pulses are intact 02/15/15 selective debridement with a disposable curette. Again the slough is less adherent and comes off with less difficulty. No surrounding infection peripheral pulses are intact. 02/22/15 selective debridement of the right medial ankle wound. Slough comes off with less difficulty. No obvious surrounding infection peripheral pulses are intact I did not debridement the one on the left. Both of these are stable to improved 03/01/15 selective debridement of both wound areas using a curette to. Adherent slough cup soft with less difficulty. No obvious surrounding infection. The patient tells me that 2 days ago she noted a rash above the right leg wrap. She did not have this on her lower legs when she change this over she arrives with widespread left greater than right almost folliculitis-looking rash which is extremely  pruritic. I don't see anything to culture here. There is no rash on the rest of her body. She feels well systemically. 03/08/15; selective debridement of both wounds using a curette. Base of this does not look unhealthy. She had limegreen drainage coming out of the left leg wound and describes a lot of drainage. The rash on her left leg looks improved to. No cultures were done. 03/22/15; patient was not here last week. Basal wounds does not look healthy and there is no surrounding erythema. No drainage. There is still a rash on the left leg that almost looks vasculitic however it is clearly limited to the top of where the wrap would be. 04/05/15; on the right required a surgical debridement of surface eschar and necrotic subcutaneous tissue. I did not debridement the area on the left. These continue to be large open wounds that are not changing that much. We were successful at one point in healing the area on the right, and at the same time the area on the left was roughly half the size of current measurements. I think a lot of the deterioration has to do with the prolonged time the patient is on her feet at work 04/19/15 I attempted-like surface debridement bilaterally she does not tolerate this. She tells me that she was in allergic care  yesterday with extreme pain over her left lateral malleolus/ankle and was told that she has an "sprain" 05/03/15; large bilateral venous insufficiency wounds over the medial malleolus/medial aspect of her ankles. She complains of copious amounts of drainage and his usual large amounts of pain. There is some increasing erythema around the wound on the right extending into the medial aspect of her foot to. historically she came in with these wounds the right one healed and the left one came down to roughly half its current size however the right one is reopened and the left is expanded. 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 05/10/15 large bilateral venous insufficiency wounds. There is less adherence surface left however the surface culture that I did last week grew pseudomonas therefore bilateral selective debridement score necessary. There is surrounding erythema. The patient describes severe bilateral drainage and a lot of pain in the left ankle. Apparently her podiatrist was were ready to do a cortisone shot 05/17/15; the patient complains of pain and again copious amounts of drainage. 05/24/15; we used Iodo flex last week. Patient notes considerable improvement in wound drainage. Only needed to change this once. 05/31/15; we continued Iodoflex; the base of these large wounds bilaterally is not too bad but there is probably likely a significant bioburden here. I would like to debridement just doesn't tolerate it. 06/06/14 I would like to continue the Iodoflex although she still hasn't managed to obtain supplies. She has bilateral medial malleoli or large wounds which are mostly superficial. Both of them are covered circumferentially with some nonviable fibrinous slough although she tolerates debridement very poorly. She apparently has an appointment for an ablation on the right leg by interventional radiology. 06/14/15; the patient arrives with the wounds and static condition. We attempted a debridement although she does not do well with this secondary to pain. I 07/05/15; wounds are not much smaller however there appears to be a cleaner granulating base. The left has tight fibrinous slough greater than the right. Debridement is tolerated poorly due to pain. Iodoflex is done more for these wounds in any of the multitude of different dressings I have tried on the left 1 and then subsequently the right. 07/12/15; no change in the condition of this wound. I am able to do an aggressive debridement on the right but not the left. She simply cannot tolerate it. We have been using Iodoflex which helps somewhat. It is  worthwhile remembering that at one point we healed the right medial ankle wound and the left was about 25% of the current circumference. We have suggested returning to vascular surgery for review of possible further ablations for one reason or another she has not been able to do this. 07/26/15 no major change in the condition of either wound on her medial ankle. I did not attempt to debridement of these. She has been aggressively scrubbing these while she is in the shower at home. She has her supply of Iodoflex which seems to have done more for these wounds then anything I have put on recently. 08/09/15 wound area appears larger although not verified by measurements. Using Iodoflex 09/05/2015 -- she was here for avisit today but had significant problems with the wound and I was asked to see her for a physician opinion. I have summarize that this lady has had surgery on her left lower extremity about 10 years ago where the possible veins stripping was done.  She has had an opinion from interventional radiology around November 2016 where no further sclerotherapy was ordered. The patient works 12 hours a day and stands on a concrete floor with work boots and is unable to get the proper compression she requires and cannot elevate her limbs appropriately at any given time. She has recently grown Pseudomonas from her wound culture but has not started her ciprofloxacin which was called in for her. 09/13/15 this continues to be a difficult situation for this patient. At one point I had this wound down to a 1.5 x 1.5" wound on her left leg. This is deteriorated and the right leg has reopened. She now has substantial wounds on her medial calcaneus, malleoli and into her lower leg. One on the left has surface eschar but these are far too painful for me to debridement here. She has a vascular surgery appointment next week to see if anything can be done to help here. I think she has had previous ablations several  years ago at [facility]. She has no major edema. She tells me that she did not get product last time [facility]] and went for several days without it. She continues to work in work boots 12 hours a day. She cannot get compression/4-layer under her work boots. 09/20/15 no major change. Periwound edema control was not very good. Her point with pain and vascular is next Wednesday the 25th 09/28/15; the patient is seen vascular surgery and is apparently scheduled for repeat duplex ultrasounds of her bilateral lower legs next week. 10/05/15; the patient was seen by Dr. Doren Custard of vascular surgery. He feels that she should have arterial insufficiency excluded as cause/contributed to her nonhealing stage she is therefore booked for an arteriogram. She has apparently monophasic signals in the dorsalis pedis pulses. She also of course has known severe chronic venous insufficiency with previous procedures as noted previously. I had another long discussion with the patient today about her continuing to work 12 hour shifts. I've written her out for 2 months area had concerns about this as her work location is currently undergoing significant turmoil and this may lead to her termination. She is aware of this however I agree with her that she simply cannot continue to stand for 12 hours multiple days a week with the substantial wound areas she has. 10/19/15; the Dr. Doren Custard appointment was largely for an arteriogram which was normal. She does not have an arterial issue. He didn't make a comment about her chronic venous insufficiency for which she has had previous ablations. Presumably it was not felt that anything additional could be done. The patient is now out of work as I prescribed 2 weeks ago. Her wounds look somewhat less aggravated presumably because of this. I felt I would give debridement another try today 10/25/15; no major change in this patient's wounds. We are struggling to get her product that she can afford  into her own home through her insurance. 11/01/15; no major change in the patient's wounds. I have been using silver alginate as the most affordable product. I spoke to Dr. Marla Roe last week with her requested take her to the OR for surgical debridement and placement of ACEL. Dr. Marla Roe told me that she would be willing to do this however [facility] will not cover this, fortunately the patient has [place] healthcare of some variant 11/08/15; no major change in the patient's wounds. She has been completely nonviable surface that this but is in too much pain  with any attempted debridement are clinic. I have arranged for her to see Dr. Marla Roe ham of plastic surgery and this appointment is on Monday. I am hopeful that they will take her to the OR for debridement, possible ACEL ultimately possible skin graft 11/22/15 no major change in the patient's wounds over her bilateral medial calcaneus medial malleolus into the lower legs. Surface on these does not look too bad however on the left there is surrounding erythema and tenderness. This may be cellulitis or could him sleepy tinea. 11/29/15; no major changes in the patient's wounds over her bilateral medial malleolus. There is no infection here and I don't think any additional antibiotics are necessary. There is now plan to move forward. She sees Dr. Marla Roe in a week's time for preparation for operative debridement and ACEL placement I believe on 7/12. She then has a follow-up appointment with Dr. Marla Roe on 7/21 12/28/15; the patient returns today having been taken to the [facility] by Dr. Marla Roe 12/12/15 she underwent debridement, intraoperative cultures [which were negative]. She had placement of a wound VAC. Parent really ACEL was not available to be placed. The wound VAC foam apparently adhered to the wound since then she's been using silver alginate, Xeroform under Ace wraps. She still says there is a lot of drainage and a lot of  pain 01/31/16; this is a patient I see monthly. I had referred her to Dr. Marla Roe him of plastic surgery for large wounds on her bilateral medial ankles. She has been to the OR twice once in early July and once in early August. She tells me over the last 3 weeks she has been using the wound VAC with ACEL underneath it. On the right we've simply been using silver alginate. Under Kerlix Coban wraps. 02/28/16; this is a patient I'm currently seeing monthly. She is gone on to have a skin graft over her large venous insufficiency ulcer on the left medial ankle. This was done by Dr. Marla Roe him. The patient is a bit perturbed about why she didn't have one on her right medial ankle wound. She has been using silver alginate to this. 03/06/16; I received a phone call from her plastic surgery Dr. Marla Roe. She expressed some concern about the viability of the skin graft she did on the left medial ankle wound. Asked me to place Endoform on this. She told me she is not planning to do a subsequent skin graft on the right as the left one did not take very well. I had placed Hydrofera Blue on the right 03/13/16; continue to have a reasonably healthy wound on the right medial ankle. Down to 3 mm in terms of size. There is epithelialization here. The area on the left medial ankle is her skin graft site. I suppose the last week this looks somewhat better. She has an open area inferiorly however in the center there appears to be some viable tissue. There is a lot of surface callus and eschar that will eventually need to come off however none of this looked to be infected. Patient states that the is able to keep the dressing on for several days which is an improvement. 03/20/16 no major change in the circumference of either wound however on the left side the patient was at Dr. Eusebio Friendly office and they did a debridement of left wound. 50% of the wound seems to be epithelialized. I been using Endoform on the left  Hydrofera Blue in the right 03/27/16; she arrives today with her wound  is not looking as healthy as they did last week. The area on the right clearly has an adherent surface to this a very similar surface on the left. Unfortunately for this patient this is all too familiar problem. Clearly the Endoform is not working and will need to change that today that has some potential to help this surface. She does not tolerate debridement in this clinic very well. She is changing the dressing wants 04/03/16; patient arrives with the wounds looking somewhat better especially on the right. Dr. Migdalia Dk change the dressing to silver alginate when she saw her on Monday and also sold her some compression socks. The usefulness of the latter is really not clear and woman with severely draining wounds. 04/10/16; the patient is doing a bit of an experiment wearing the compression stockings that Dr. Migdalia Dk provided her to her left leg and the out of legs based dressings that we provided to the right. 05/01/16; the patient is continuing to wear compression stockings Dr. Migdalia Dk provided her on the left that are apparently silver impregnated. She has been using Iodoflex to the right leg wound. Still a moderate amount of drainage, when she leaves here the wraps only last for 4 days. She has to change the stocking on the left leg every night 05/15/16; she is now using compression stockings bilaterally provided by Dr. Marla Roe. She is wearing a nonadherent layer over the wounds so really I don't think there is anything specific being done to this now. She has some reduction on the left wound. The right is stable. I think all healing here is being done without a specific dressing 06/09/16; patient arrives here today with not much change in the wound certainly in diameter to large circular wounds over the medial aspect of her ankle bilaterally. Under the light of these services are certainly not viable for healing. There is no  evidence of surrounding infection. She is wearing compression stockings with some sort of silver impregnation as prescribed by Dr. Marla Roe. She has a follow-up with her tomorrow. 06/30/16; no major change in the size or condition of her wounds. These are still probably covered with a nonviable surface. She is using only her purchase stockings. She did see Dr. Marla Roe who seemed to want to apply Dakin's solution to this I'm not extreme short what value this would be. I would suggest Iodoflex which she still has at home. 07/28/16; I follow [name] episodically along with Dr. Marla Roe. 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 She has been applying a topical collagen ointment to both wounds with Adaptic. I don't think Dr. Marla Roe is planning to take her back to the OR. 08/19/16; I follow [name] on a monthly basis along with Dr. Marla Roe of plastic surgery. 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 I been following her for a number of years. At one point I was able to get the right medial malleolus wound to heal and had the left medial malleolus down to about half its current size however and I had to send her to plastic surgery for an operative debridement. Since then things have been stable to slightly improve the area on the right is slightly better one in the left about the same although there is much less adherent surface than I'm used to with this patient. She is using some form of liquid collagen gel that Dr. Marla Roe provided  a Kerlix cover with the patient's own pressure stockings. She tells me that she has extreme pain in both ankles and along the lateral aspect of both feet. She has been unable to work for some period of time. She is telling me she is retiring at the beginning of April. She sees Dr. Doran Durand of orthopedics next week 09/22/16;  patient has not seen Dr. Marla Roe since the last time she is here. I'm not really sure what she is using to the wounds other than bits and pieces of think she had left over including most recently Hydrofera Blue. She is using juxtalite stockings. She is having difficulty with her husband's recent illness "stroke". She is having to transport him to various doctors appointments. Dr. Marla Roe left her the option of a repeat debridement with ACEL however she has not been able to get the time to follow-up on this. She continues to have a fair amount of drainage out of these wounds with certainly precludes leaving dressings on all week 10/13/16; patient has not seen Dr. Marla Roe since she was last in our clinic. I'm not really sure what she is doing with the wounds, we did try to get her [facility] and I think she is actually using this most of the time. Because of drainage she states she has to change this every second day although this is an improvement from what she used to do. She went to see Dr. Doran Durand who did not think she had a muscular issue with regards to her feet, he referred her to a neurologist and I think the appointment is sometime in June. I changed her back to Iodoflex which she has used in the past but not recently. 11/03/16; the patient has been using Iodoflex although she ran out of this. Still claims that there is a lot of drainage although the wound does not look like this. No surrounding erythema. She has not been back to see Dr. Marla Roe 11/24/16; the patient has been using Iodoflex again but she ran out of it 2 or 3 days ago. There is no major change in the condition of either one of these wounds in fact they are larger and covered in a thick adherent surface slough/nonviable tissue especially on the left. She does not tolerate mechanical debridement in our clinic. Going back to see Dr. Marla Roe of plastic surgery for an operative debridement would seem reasonable. 12/15/16;  the patient has not been back to see Dr. Marla Roe. She is been dealing with a series of illnesses and her husband which of monopolized her time. She is been using Sorbact which we largely supplied. She states the drainage is bad enough that it maximum she can go 2-3 days without changing the dressing 01/12/2017 -- the patient has not been back for about 4 weeks and has not seen Dr. Marla Roe not does she have any appointment pending. 01/23/17; patient has not seen Dr. Marla Roe even though I suggested this previously. She is using Santyl that was suggested last week by Dr. Con Memos this Cost her $16 through her insurance which is indeed surprising 02/12/17; continuing Santyl and the patient is changing this daily. A lot of drainage. She has not been back to see plastic surgery she is using an Ace wrap. Our intake nurse suggested wrap around stockings which would make a good reasonable alternative 02/26/17; patient is been using Santyl and changing this daily due to drainage. She has not been to see plastic surgery she uses in April Ace wrap to  control the edema. She did obtain extremitease stockings but stated that the edema in her leg was to big for these 03/20/17; patient is using Santyl and Anasept. Surfaces looked better today the area on the right is actually measuring a little smaller. She has states she has a lot of pain in her feet and ankles and is asking for a consult to pain control which I'll try to help her with through our case manager. 04/10/17; the patient arrives with better-looking wound surfaces and is slightly smaller wound on the left she is using a combination of Santyl and Anasept. She has an appointment or at least as started in the pain control center associated with [facility] 05/14/17; this is a patient who I followed for a prolonged period of time. 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 At one point I had this down to a much smaller wound on  the left however these reopened and we've never been able to get these to heal. She has been using Santyl and Anasept gel although 2 weeks ago she ran out of the Anasept gel. She has a stable appearance of the wound. She is going to the wound care clinic at [facility]. They wanted do a nerve block/spinal block although she tells me she is reluctant to go forward with that. 05/21/17; this is a patient I have followed for many years. 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 Chronic pain and deformity in her ankles as well. She is been to see plastic surgery as well as orthopedics. Using PolyMem AG most recently/Kerramax/ABDs and 2 layer compression. She has managed to keep this on and she is coming in for a nurse check to change the dressing on Tuesdays, we see her on Fridays 06/05/17; really quite a good looking surface and the area especially on the right medial has contracted in terms of dimensions. Well granulated healthy-looking tissue on both sides. Even with an open curet there is nothing that even feels abnormal here. This is as good as I've seen this in quite some time. We have been using PolyMem AG and bringing her in for a nurse check 06/12/17; really quite good surface on both of these wounds. The right medial has contracted a bit left is not. We've been using PolyMem and AG and she is coming in for a nurse visit 06/19/17; we have been using PolyMem AG and bringing her in for a nurse check. Dimensions of her wounds are not better but the surfaces looked better bilaterally. She complained of bleeding last night and the left wound and increasing pain bilaterally. She states her wound pain is more neuropathic than just the wounds. There was some suggestion that this was radicular from her pain management doctor in talking to her it is really difficult to sort this out. 06/26/17; using PolyMem and AG and bringing her in for a nurse check as All of this and reasonably  stable condition. Certainly not improved. The dimensions on the lateral part of the right leg look better but not really measuring better. The medial aspect on the left is about the same. 07/03/16; we have been using PolyMen AG and bringing her in for a nurse check to change the dressings as the wounds have drainage which precludes once weekly changing. We are using all secondary absorptive dressings.our intake nurse is brought up the idea of using a wound VAC/snap VAC on the wound to help with the drainage to see if  this would result in some contraction. This is not a bad idea. The area on the right medial is actually looking smaller. Both wounds have a reasonable-looking surface. There is no evidence of cellulitis. The edema is well controlled 07/10/17; the patient was denied for a snap VAC by her insurance. The major issue with these wounds continues to be drainage. We are using wicked PolyMem AG and she is coming in for a nurse visit to change this. The wounds are stable to slightly improved. The surface looks vibrant and the area on the right certainly has shrunk in size but very slowly 07/17/17; the patient still has large wounds on her bilateral medial malleoli. Surface of both of these wounds looks better. The dimensions seem to come and go but no consistent improvement. There is no epithelialization. We do not have options for advanced treatment products due to insurance issues. They did not approve of the wound VAC to help control the drainage. More recently we've been using PolyMem and AG wicked to allow drainage through. We have been bringing her in for a nurse visit to change this. We do not have a lot of options for wound care products and the home again due to insurance issues 07/24/17; the patient's wound actually looks somewhat better today. No drainage measurements are smaller still healthy-looking surface. We used silver collagen under PolyMen started last week. We have been bringing her  in for a dressing change 07/31/17; patient's wound surface continued to look better and I think there is visible change in the dimensions of the wound on the right. Rims of epithelialization. We have been using silver collagen under PolyMen and bringing her in for a dressing change. There appears to be less drainage although she is still in need of the dressing change 08/07/17. Patient's wound surface continues to look better on both sides and the area on the right is definitely smaller. We have been using silver collagen and PolyMen. She feels that the drainage has been it has been better. I asked her about her vascular status. She went to see Dr. Aleda Grana at [facility] and had some form of ablation. I don't have much detail on this. I haven't my notes from 2016 that she was not a candidate for any further ablation but I don't have any more information on this. We had referred her to vein and vascular I don't think she ever went. He does not have a history of PAD although I don't have any information on this either. We don't even have ABIs in our record 08/14/17; we've been using silver collagen and PolyMen cover. And putting the patient and compression. She we are bringing her in as a nurse visit to change this because ofarge amount of drainage. We didn't the ABIs in clinic today since they had been done in many moons 1.2 bilaterally. She has been to see vein and vascular however this was at [facility] and she had ablation although I really don't have any information on this all seemed biking get a report. She is also been operatively debrided by plastic surgery and had a cell placed probably 8-12 months ago. This didn't have a major effect. We've been making some gains with current dressings 08/19/17-She is here in follow-up evaluation for bilateral medial malleoli ulcers. She continues to tolerate debridement very poorly. We will continue with recently changed topical treatment; if no  significant improvement may consider switching to Iodosorb/Iodoflex. 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 She has been using silver collagen and PolyMem. I believe she has been used and tried on Iodoflex before. 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 These have been very refractory over time. We have been using silver collagen and PolyMen. She is coming in once a week for a doctor's and once a week for nurse visits. We are actually making some progress 09/18/17; the patient's wounds are smaller especially on the right medial. She arrives today to upset to consider even washing these off with Anasept which I think is been part of the reason this is been closing. We've been using collagen covered in PolyMen otherwise. It is noted that she has a small area of folliculitis on the right medial calf that. As we are wrapping her legs I'll give her a short course of doxycycline to make sure this doesn't amount to anything. She is a long list of complaints today including imbalance, shortness of breath on exertion, inversion of her left ankle. With regards to the latter complaints she is been to see orthopedics and they offered her a tendon release surgery I believe but wanted her wounds to be closed first. I have recommended she go see her primary physician with regards to everything else. 09/25/17; patient's wounds are about the same size. We have made some progress bilaterally although not in recent weeks. She will not allow me T wash these wounds with Anasept even if she is doing her cell. Wheeze we've been using collagen covered in PolyMen. Last week she had a small area of folliculitis this is now opened into a small wound. She completed 5 days of trimethoprim  sulfamethoxazole 10/02/17; unfortunately the area on her left medial ankle is worse with a larger wound area towards the Achilles. The patient complains of a lot of pain. She will not allow debridement although visually I don't think there is anything to debridement in any case. We have been using silver collagen and PolyMen for several months now. Initially we are making some progress although I'm not really seeing that today. We will move back to [facility]. His admittedly this is a bit of a repeat however I'm hoping that his situation is different now. The patient tells me she had her leg on the left give out on her yesterday this is process some pain. 10/09/17; the patient is seen twice a week largely because of drainage issues coming out of the chronic medial bimalleolar wounds that are chronic. Last week the dimensions of the one on the left looks a little larger I changed her to [facility]. She comes in today with a history of terrible pain in the bilateral wound areas. She will not allow debridement. She will not even allow a tissue culture. There is no surrounding erythema no no evidence of cellulitis. We have been putting her Kerlix Coban man. She will not allow more aggressive compression as there was a suggestion to put her in 3 layer wraps. 10/16/17; large wounds on her bilateral medial malleoli. These are chronic. Not much change from last week. The surface looks have healthy but absolutely no epithelialization. A lot of pain little less so of drainage. She will not allow debridement or even washing these off in the vigorous fashion with Anasept. 10/23/17; large wounds on her bilateral malleoli  which are chronic. Some improvement in terms of size perhaps on the right since last time I saw these. She states that after we increased the 3 layer compression there was some bleeding, when she came in for a nurse visit she did not want 3 layer compression put back on about our nurse managed  to convince her. She has known chronic venous visit issues and I'm hoping to get her to tolerate the 3 layer compression. using Hydrofera Blue 10/30/17; absolutely no change in the condition of either wound although we've had some improvement in dimensions on the right.. Attempted to put her in 3 layer compression she didn't tolerated she is back in 2 layer compression. We've been using Hydrofera Blue We looked over her past records. She had venous reflux studies in November 2016. There was no evidence of deep venous reflux on the right. Superficial vein did not show the greater saphenous vein at think this is been previously ablated the small saphenous vein was within normal limits. The left deep venous system showed no DVT the vessels were positive for deep venous reflux in the posterior tibial veins at the ankle. The greater saphenous vein was surgically absent small saphenous vein was within normal limits. She went to vein and vascular at [facility]. I believe she had an ablation on the left greater saphenous vein. I'll update her reflux studies perhaps ever reviewed by vein and vascular. We've made absolutely no progress in these wounds. Will also try to read and TheraSkins through her insurance 11/06/17; W the patient apparently has a 2 week follow-up with vein and vascular I like him to review the whole issue with regards to her previous vascular workup by Dr. Aleda Grana. We've really made no progress on these wounds in many months. She arrives today with less viable looking surface on the left medial ankle wound. This was apparently looking about the same on Tuesday when she was here for nurse visit. 11/13/17; deep tissue culture I did last time of the left lower leg showed multiple organisms without any predominating. In particular no Staphylococcus or group A strep were isolated. We sent her for venous reflux studies. She's had a previous left greater saphenous vein stripping and I think  sclerotherapy of the right greater saphenous vein. She didn't really look at the lesser saphenous vein this both wounds are on the medial aspect. She has reflux in the common femoral vein and popliteal vein and an accessory vein on the right and the common femoral vein and popliteal vein on the left. I'm going to have her go to see vein and vascular just the look over things and see if anything else beside aggressive compression is indicated here. We have not been able to make any progress on these wounds in spite of the fact that the surface of the wounds is never look too bad. 11/20/17; no major change in the condition of the wounds. Patient reports a large amount of drainage. She has a lot of complaints of pain although enlisting her today I wonder if some of this at least his neuropathic rather than secondary to her wounds. She has an appointment with vein and vascular on 12/30/17. The refractory nature of these wounds in my mind at least need vein and vascular to look over the wounds the recent reflux studies we did and her history to see if anything further can be done here. I also note her gait is deteriorated quite a bit. Looks like she has inversion  of her foot on the right. She has a bilateral Trendelenburg gait. I wonder if this is neuropathic or perhaps multilevel radicular. 11/27/17; her wounds actually looks slightly better. Healthy-looking granulation tissue a scant amount of epithelialization. [place] healthcare will not pay for [employer]. They will play for tri layer Oasis and Dermagraft. This is not a diabetic ulcer. We'll try for the tri layer Oasis. She still complains of some drainage. She has a vein and vascular appointment on 12/30/17 12/04/17; the wounds visually look quite good. Healthy-looking granulation with some degree of epithelialization. We are still waiting for response to our request for trial to try layer Oasis. Her appointment with vascular to review venous and arterial  issues isn't sold the end of July 7/31. Not allow debridement or even vigorous cleansing of the wound surface. 12/18/17; slightly smaller especially on the right. 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 We've been using Hydrofera Blue. We still are looking into triple layer Oasis through her insurance 01/08/18 on evaluation today patient's wound actually appears to be showing signs of good improvement at this point in time. She has been tolerating the dressing changes without complication. Fortunately there does not appear to be any evidence of infection at this point in time. We have been utilizing silver nitrate which does seem to be of benefit for her which is also good news. Overall I'm very happy with how things seem to be both regards appearance as well as measurement. Patient did see Dr. Bridgett Larsson for evaluation on 12/30/17. In his assessment he felt that stripping would not likely add much more than chronic compression to the patient's healing process. His recommendation was to follow-up in three months with Dr. Doren Custard if she hasn't healed in order to consider referral back to you and see vascular where she previously was in a trial and was able to get her wound to heal. I'll be see what she feels she when you staying compression and he reiterated this as well. 01/13/18 on evaluation today patient appears to actually be doing very well in regard to her bilateral medial malleolus ulcers. She seems to have tolerated the chemical cauterization with silver nitrate last week she did have some pain through that evening but fortunately states that I'll be see since it seems to be doing better she is overall pleased with the progress. 01/21/18; really quite a remarkable improvement since I've last seen these wounds. We started using silver nitrate specially on the islands of hyper granulation which for some reason her around the wound circumference. This is really done quite nicely.  Primary dressing Hydrofera Blue under 4 layer compression. She seems to be able to hold out without a nurse rewrap. Follow-up in 1 week 01/28/18; we've continued the hydrofera blue but continued with chemical cauterization to the wound area that we started about a month ago for irregular hyper granulation. She is made almost stunning improvement in the overall wound dimensions. I was not really expecting this degree of improvement in these chronic wounds 02/05/18; we continue with Hydrofera Bluebut of also continued the aggressive chemical cauterization with silver nitrate. We made nice progress with the right greater than left wound. 02/12/18. We continued with Hydrofera Blue after aggressive chemical cauterization with silver nitrate. We appear to be making nice progress with both wound areas 02/19/2018; we continue with [facility] after washing the wounds vigorously with Anasept spray and chemical cauterization with silver nitrate. We are making excellent progress. The area on the right's just about  closed 02/26/2018. The area on the left medial ankle had too much necrotic debris today. I used a #5 curette we are able to get most of the soft. I continued with the silver nitrate to the much smaller wound on the right medial ankle she had a new area on her right lower pretibial area which she says was due to a role in her compression 03/05/2018; both wound areas look healthy. Not much change in dimensions from last week. I continue to use silver nitrate and Hydrofera Blue. The patient saw Dr. Doren Custard of vein and vascular. He felt she had venous stasis ulcers. He felt based on her previous arteriogram she should have adequate circulation for healing. Also she has deep venous reflux but really no significant correctable superficial venous reflux at this time. He felt we should continue with conservative management including leg elevation and compression 04/02/2018; since we last saw this woman about a  month ago she had a fall apparently suffered a pelvic fracture. I did not look up the x-ray. Nevertheless because of pain she literally was bedbound for 2 weeks and had home health coming out to change the dressing. Somewhat predictably this is resulted in considerable improvement in both wound areas. The right is just about closed on the medial malleolus and the left is about half the size. 04/16/2018; both her wounds continue to go down in size. Using Hydrofera Blue. 05/07/18; both her wounds appeared to be improving especially on the right where it is almost closed. We are using Hydrofera Blue 05/14/2018; slightly worse this week with larger wounds. Surface on the left medial not quite as good. We have been using Hydrofera Blue 05/21/18; again the wounds are slightly larger. Left medial malleolus slightly larger with eschar around the circumference. We have been using Hydrofera Blue undergoing a wraps for a prolonged period of time. This got a lot better when she was more recumbent due to a fall and a back injury. I change the primary dressing the silver alginate today. She did not tolerate a 4 layer compression previously although I may need to bring this up with her next time 05/28/2018; area on the left medial malleolus again is slightly larger with more drainage. Area on the right is roughly unchanged. She has a small area of folliculitis on the right medial just on the lower calf. This does not look ominous. 06/03/2018 left medial malleolus slightly smaller in a better looking surface. We used silver nitrate on this last time with silver alginate. The area on the right appears slightly smaller 1/10; left medial malleolus slightly smaller. Small open area on the right. We used silver nitrate and silver alginate as of 2 weeks ago. We continue with the wound and compression. These got a lot better when she was off her feet 1/17; right medial malleolus wound is smaller. The left may be slightly  smaller. Both surfaces look somewhat better. 1/24; both wounds are slightly smaller. Using silver alginate under Unna boots 1/31; both wounds appear smaller in fact the area on the right medial is just about closed. Surface eschar. We have been using silver alginate under Unna boots. The patient is less active now spends let much less time on her feet and I think this is contributed to the general improvement in the wound condition 2/7; both wounds appear smaller. I was hopeful the right medial would be closed however there there is still the same small open area. Slight amount of surface eschar on the left  the dimensions are smaller there is eschar but the wound edges appear to be free. We have been using silver alginate under Unna boot's 2/14; both wounds once again measure smaller. Circumferential eschar on the left medial. We have been using silver alginate under Unna boots with gradual improvement 2/21; the area on the right medial malleolus has healed. The area on the left is smaller. We have been using silver alginate and Unna boots. We can discharge wrapping the right leg she has 20/30 stockings at home she will need to protect the scar tissue in this area 2/28; the area on the right medial malleolus remains closed the patient has a compression stocking. The area on the left is smaller. We have been using silver alginate and Unna boots. 3/6 the area on the right medial ankle remains closed. Good edema control noted she is using her own compression stocking. The area on the left medial ankle is smaller. We have been managing this with silver alginate and Unna boots which we will continue today. 3/13; the area on the right medial ankle remains closed and I'm declaring it healed today. When necessary the left is about the same still a healthy-looking surface but no major change and wound area. No evidence of infection and using silver alginate under unna and generally making considerable  improvement 3/27 the area on the right medial ankle remains closed the area on the left is about the same as last week. Certainly not any worse we have been using silver alginate under an Unna boot 4/3; the area on the right medial ankle remains closed per the patient. We did not look at this wound. The wound on the left medial ankle is about the same surface looks healthy we have been using silver alginate under an Unna boot 4/10; area on the right medial ankle remains closed per the patient. We did not look at this wound. The wound on the left medial ankle is slightly larger. The patient complains that the [facility] caused burning pain all week. She also told us that she was a lot more active this week. Changed her back to silver alginate 4/17; right medial ankle still closed per the patient. Left medial ankle is slightly larger. Using silver alginate. She did not tolerate Hydrofera Blue on this area 4/24; right medial ankle remains closed we have not look at this. The left medial ankle continues to get larger today by about a centimeter. We have been using silver alginate under Unna boots. She complains about 4 layer compression as an alternative. She has been up on her feet working on her garden 5/8; right medial ankle remains closed we did not look at this. The left medial ankle has increased in size about 100%. We have been using silver alginate under Unna boots. She noted increased pain this week and was not surprised that the wound is deteriorated 5/15; no major change in SA however much less erythema ( one week of doxy ocellulitis). 5/22-64 year old female returns at 1 week to the clinic for left medial ankle wound for which we have been using silver alginate under 3 layer compression She was placed on DOXY at last visit - the wound is wider at this visit. She is in 3 layer compression 5/29; change to [facility] last week. I had given her empiric doxycycline 2 weeks ago for a week.  She is in 3 layer compression. She complains of a lot of pain and drainage on presentation today. 6/5; using Hydrofera  Blue. I gave her doxycycline recently empirically for erythema and pain around the wound. Believe her cultures showed enterococcus which not would not have been well covered by doxycycline nevertheless the wound looks better and I don't feel specifically that the enterococcus needs to be covered. She has a new what looks like a wrap injury on her lateral left ankle. 6/12; she is using Hydrofera Blue. She has a new area on the left anterior lower tibial area. This was a wrap injury last week. 6/19; the patient is using Hydrofera Blue. She arrived with marked inflammation and erythema around the wound and tenderness. 12/01/18 on evaluation today patient appears to be doing a little bit better based on what I'm hearing from the standpoint of lassos evaluation to this as far as the overall appearance of the wound is concerned. Then sometime substandard she typically sees Dr. Dellia Nims. Nonetheless overall very pleased with the progress that she's made up to this point. No fevers, chills, nausea, or vomiting noted at this time. 7/10; some improvement in the surface area. Aggressively debrided last week apparently. I went ahead with the debridement today although the patient does not tolerate this very well. We have been using Iodoflex. Still a fair amount of drainage 7/17; slightly smaller. Using Iodoflex. 7/24; no change from last week in terms of surface area. We have been using Iodoflex. Surface looks and continues to look somewhat better 7/31; surface area slightly smaller better looking surface. We have been using Iodoflex. This is under Unna boot compression 8/7-Patient presents at 1 week with Unna boot and Iodoflex, wound appears better 8/14-Patient presents at 1 week with Iodoflex, we use the Unna boot, wound appears to be stable better.Patient is getting Botox treatment for the  inversion of the foot for tendon release, Next week 8/21; we are using Iodoflex. Unna boot. The wound is stable in terms of surface area. Under illumination there is some areas of the wound that appear to be either epithelialized or perhaps this is adherent slough at this point I was not really clear. It did not wipe off and I was reluctant to debride this today. 8/28; we are using Iodoflex in an Unna boot. Seems to be making good improvement. 9/4; using Iodoflex and wound is slightly smaller. 9/18; we are using Iodoflex with topical silver nitrate when she is here. The wound continues to be smaller 10/2; patient missed her appointment last week due to GI issues. She left and Iodoflex based dressing on for 2 weeks. Wound is about the same size about the size of a dime on the left medial lower 10/9 we have been using Iodoflex on the medial left ankle wound. She has a new superficial probable wrap injury on the dorsal left ankle 10/16; we have been using Hydrofera Blue since last week. This is on the left medial ankle 10/23; we have been using Hydrofera Blue since 2 weeks ago. This is on the left medial ankle. Dimensions are better 11/6; using Hydrofera Blue. I think the wound is smaller but still not closed. Left medial ankle 11/13; we have been using Hydrofera Blue. Wound is certainly no smaller this week. Also the surface not as good. This is the remanent of a very large area on her left medial ankle. 11/20; using Sorbact since last week. Wound was about the same in terms of size although I was disappointed about the surface debris 12/11; 3-week follow-up. Patient was on vacation. Wound is measuring slightly larger we have been using Sorbact.  12/18; wound is about the same size however surface looks better last week after debridement. We have been using Sorbact under compression 1/15 wound is probably twice the size of last time increased in length nonviable surface. We have been using Sorbact.  She was running a mild fever and missed her appointment last week 1/22; the wound is come down in size but under illumination still a very adherent debris we have been Hydrofera Blue that I changed her to last week 1/29; dimensions down slightly. We have been using Hydrofera Blue 2/19 dimensions are the same however there is rims of epithelialization under illumination. Therefore more the surface area may be epithelialized 2/26; the patient's wound actually measures smaller. The wound looks healthy. We have been using Hydrofera Blue. I had some thoughts about running Apligraf then I still may do that however this looks so much better this week we will delay that for now 3/5; the wound is small but about the same as last week. We have been using Hydrofera Blue. No debridement is required today. 3/19; the wound is about the size of a dime. Healthy looking wound even under illumination. We have been using Hydrofera Blue. No mechanical debridement is necessary 3/26; not much change from last week although still looks very healthy. We have been using Hydrofera Blue under Unna boots Patient was offered an ankle fusion by podiatry but not until the wound heals with a proceed with this. 4/9; the patient comes in today with her original wound on the medial ankle looking satisfactory however she has some uncontrolled swelling in the middle part of her leg with 2 new open areas superiorly just lateral to the tibia. I think this was probably a wrap issue. She said she felt uncomfortable during the week but did not call in. We have been using Hydrofera Blue 4/16; the wound on the medial ankle is about the same. She has innumerable small areas superior to this across her mid tibia. I think this is probably folliculitis. She is also been working in the yard doing a lot of sweating 4/30; the patient issue on the upper areas across her mid tibia of all healed. I think this was excessive yard work if I remember. Her  wound on the medial ankle is smaller. Some debris on this we have been using Hydrofera Blue under Unna boots 5/7; mid tibia. She has been using Hydrofera Blue under an Unna wrap. She is apparently going for her ankle surgery on June 3 10/28/19-Patient returns to clinic with the ankle wound, we are using Hydrofera Blue under Unna wrap, surgery is scheduled for her left foot for June 3 so she will be back for nurse visit next week READMISSION 01/17/2020 [name] is a 65 year old woman we have had in this clinic for a long period of time with severe venous hypertension and refractory wounds on her medial lower legs and ankles bilaterally. 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 When she was off her legs for a prolonged period example when she fell and suffered a compression fracture things would heal up quite nicely. She is now retired and we managed to heal up the right medial leg wound. The left one was very tiny last time I saw this although still refractory. 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 It got to the point where this was interfering with her walking and she elected to proceed with a ankle arthrodesis to straighten her her ankle and leave her with a functional outcome for mobilization. The patient was referred to Dr. Doren Custard and really this took some time to arrange. Dr. Doren Custard saw her on 12/07/2019. Once again he verified that she had no arterial issues. She had previously had an angiogram several years ago. Follow-up ABIs on the left showed an ABI of 1.12 with triphasic waveforms and a TBI of 0.92. She is felt to have chronic deep venous insufficiency but I do not think it was felt that anything could be done from about this from an ablation point of view. At the time Dr.  Doren Custard saw this patient the wounds actually look closed via the pictures in his clinic. The patient finally underwent her surgery on 12/15/2019. This went reasonably well and there was a good anatomic outcome. She developed a small distal wound dehiscence on the lateral part of the surgical wound. However more problematically she is developed recurrence of the wound on the medial left ankle. There are actually 2 wounds here one in the distal lower leg and 1 pretty much at the level of the medial malleolus. It is a more distal area that is more problematic. She has been using Hydrofera Blue which started on Friday before this she was simply Ace wrapping. There was a culture done that showed Pseudomonas and she is on ciprofloxacin. A recent CNS on 8/11 was negative. The patient reports some pain but I generally think this is improving. She is using a cam boot completely nonweightbearing using a walker for pivot transfers and a wheelchair 8/24; not much improvement unfortunately she has a surgical wound on the lateral part in the venous insufficiency wound medially. The bottom part of the medial insufficiency wound is still necrotic there is exposed tendon here. We have been using Hydrofera Blue under compression. Her edema control is however better 8/31; patient in for follow-up of his surgical wound on the lateral part of her left leg and chronic venous insufficiency ulcers medially. We put her back in compression last week. She comes in today with a complaint of 3 or 4 days worth of increasing pain. She felt her cam walker was rubbing on the area on the back of her heel. However there is intense erythema seems more likely she has cellulitis. She had 2 cultures done when she was seeing podiatry in the postop. One of them in late July showed Pseudomonas and she received a course of ciprofloxacin the other was negative on 8/11 she is allergic to penicillin with anaphylactoid complaints of hives oral  swelling via information in epic 9/9; when I saw this patient last week she had intense anterior erythema around her wound on the right lateral heel and ankle and also into the right medial heel. Some of this was no doubt drainage and her walker boot however I was convinced she had cellulitis. I gave her Levaquin and Bactrim she is finishing up on this now. She is following up with Dr. [name] he saw her yesterday. He is taken her out of the walking boot of course she is still nonweightbearing. Her x-ray was negative for any worrisome features such as soft tissue air etc. Things are a lot better this week. She has home health. We have been using Hydrofera Blue under an [employer] which she put back on yesterday.  I did not wrap her last week 9/17; her surrounding skin looks a lot better. In fact the area on the left lateral ankle has just a scant amount of eschar. The only remaining wound is the large area on the left medial ankle. Probably about 60% of this is healthy granulation at the surface however she has a significant divot distally. This has adherent debris in it. I been using debridement and silver collagen to try and get this area to fill-in although I do not think we have made much progress this week 9/24; the patient's wound on the left medial ankle looks a lot better. The deeper divot area distally still requires debridement but this is cleaning up quite nicely we have been using silver collagen. The patient is complaining of swelling in her foot and is worried that that is contributing to the nonhealing of the ankle wound. She is also complaining of numbness in her anterior toes 10/4; left medial ankle. The small area distally still has a divot with necrotic material that I have been debriding away. This has an undermining area. She is approved for Apligraf. She saw Dr. [name] her surgeon on 10/1. I think he declared himself is satisfied with the condition of things. Still nonweightbearing  till the end of the month. 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 Her surgical wound is well closed. There is no evidence of infection 10/11; the patient arrived in clinic today with the expectation that we be able to put an Apligraf on this area after debridement however she arrives with a relatively large amount of green drainage on the dressing. The patient states that this started on Friday. She has not been systemically unwell. 10/19; culture I did last week showed both Enterococcus and Pseudomonas. I think this came in separate parts because I stopped her ciprofloxacin I gave her and prescribed her linezolid however now looking at the final culture result this is Pseudomonas which is resistant to quinolones. She has not yet picked up the linezolid apparently phone issues. We are also trying to get a topical antibiotic out of [place] in [place] they can be applied by home health. She is still having green drainage 10/16; the patient has her topical antibiotic from [facility] in [place]. This is a compounded gel with vancomycin and ciprofloxacin and gentamicin. We are applying this on the wound bed with silver alginate over the top with Unna boot wraps. She arrives in clinic today with a lot less ominous looking drainage although she is only use this topical preparation once the second time today. She sees Dr. [name] her surgeon on Friday she has home health changing the dressing 11/2; still using her compounded topical antibiotic under silver alginate. Surface is cleaning up there is less drainage. We had an Apligraf for her today and I elected to apply it. A light coating of her antibiotic 04/25/2020 upon evaluation today patient appears to be doing well with regard to her ankle ulcer. There is a little bit of slough noted on the surface of the wound I am can have to perform sharp debridement to clear this away today. With that being said other  than that fact overall I feel like she is making progress and we do see some new epithelial growth. There is also some improvement in the depth of the wound and that distal portion. There is little bit of slough there as well. 12/7; 2-week follow-up. Apligraf #3. Dimensions are smaller.  Closing in especially inferiorly. Still some surface debris. Still using the [facility] topical antibiotic but I told her that I don't think this needs to be renewed Objective Constitutional Sitting or standing Blood Pressure is within target range for patient.. Pulse regular and within target range for patient.Marland Kitchen Respirations regular, non-labored and within target range.. Temperature is normal and within the target range for the patient.Marland Kitchen Appears in no distress. Vitals Time Taken: 9:14 AM, Height: 68 in, Temperature: 98.1 F, Pulse: 70 bpm, Respiratory Rate: 20 breaths/min, Blood Pressure: 101/50 mmHg. General Notes: Wound exam; dimensions are smaller. There is epithelialization inferiorly. Still some depth. I used a #5 curette to remove surface slough from the remaining part of the wound and then applied Apligraf #3 in the standard fashion. No evidence of surrounding infection Integumentary (Hair, Skin) Wound #15 status is Open. Original cause of wound was Gradually Appeared. The wound is located on the Left,Medial Malleolus. The wound measures 3.9cm length x 1.4cm width x 0.8cm depth; 4.288cm^2 area and 3.431cm^3 volume. There is Fat Layer (Subcutaneous Tissue) exposed. There is no tunneling or undermining noted. There is a medium amount of serosanguineous drainage noted. There is medium (34-66%) pink granulation within the wound bed. There is a medium (34-66%) amount of necrotic tissue within the wound bed including Adherent Slough. Assessment Active Problems ICD-10 Chronic venous hypertension (idiopathic) with ulcer and inflammation of left lower extremity Non-pressure chronic ulcer of other part of left lower  leg with other specified severity Non-pressure chronic ulcer of left ankle with other specified severity Procedures Wound #15 Pre-procedure diagnosis of Wound #15 is a Venous Leg Ulcer located on the Left,Medial Malleolus .Severity of Tissue Pre Debridement is: Fat layer exposed. There was a Selective/Open Wound Skin/Epidermis Debridement with a total area of 5.46 sq cm performed by [name]., MD. With the following instrument(s): Curette to remove Viable and Non-Viable tissue/material. Material removed includes Slough, Skin: Dermis, and Skin: Epidermis after achieving pain control using Lidocaine 5% topical ointment. No specimens were taken. A time out was conducted at 09:40, prior to the start of the procedure. A Minimum amount of bleeding was controlled with Pressure. The procedure was tolerated well with a pain level of 0 throughout and a pain level of 0 following the procedure. Post Debridement Measurements: 3.9cm length x 1.4cm width x 0.8cm depth; 3.431cm^3 volume. Character of Wound/Ulcer Post Debridement is improved. Severity of Tissue Post Debridement is: Fat layer exposed. Post procedure Diagnosis Wound #15: Same as Pre-Procedure Pre-procedure diagnosis of Wound #15 is a Venous Leg Ulcer located on the Left,Medial Malleolus. A skin graft procedure using a bioengineered skin substitute/cellular or tissue based product was performed by [name]., MD with the following instrument(s): Blade and Forceps. Apligraf was applied and secured with Steri-Strips. 44 sq cm of product was utilized and 0 sq cm was wasted. Post Application, adaptic was applied. A Time Out was conducted at 09:43, prior to the start of the procedure. The procedure was tolerated well with a pain level of 0 throughout and a pain level of 0 following the procedure. Post procedure Diagnosis Wound #15: Same as Pre-Procedure . Plan Follow-up Appointments: Return Appointment in 2 weeks. Cellular or Tissue  Based Products: Cellular or Tissue Based Product Type: - apli graft #3 Cellular or Tissue Based Product applied to wound bed, secured with steri-strips, cover with Adaptic or Mepitel. (DO NOT REMOVE). - home may change outter dressing only every other week, patient seen at wound center every other week  Bathing/ Shower/ Hygiene: May shower with protection but do not get wound dressing(s) wet. Edema Control - Lymphedema / SCD / Other: Elevate legs to the level of the heart or above for 30 minutes daily and/or when sitting, a frequency of: Avoid standing for long periods of time. Exercise regularly Home Health: New wound care orders this week; continue Home Health for wound care. May utilize formulary equivalent dressing for wound treatment orders unless otherwise specified. Other Home Health Orders/Instructions: - AMEDYSIS WOUND #15: - Malleolus Wound Laterality: Left, Medial Prim Dressing: apligraf Other:/Other:every other week ary Discharge Instructions: applied by MD : DO NOT REMOVE Secondary Dressing: Woven Gauze Sponge, Non-Sterile 4x4 in (Home Health) 1 x Per Week/15 Days Discharge Instructions: Apply over primary dressing as directed. Secondary Dressing: ABD Pad, 5x9 (Home Health) 1 x Per Week/15 Days Discharge Instructions: Apply over primary dressing as directed. Secondary Dressing: Drawtex 4x4 in [facility]) (Generic) 1 x Per Week/15 Days Discharge Instructions: Apply over primary dressing as directed. Com pression Wrap: Unnaboot w/Calamine, 4x10 (in/yd) (Home Health) 1 x Per Week/15 Days Discharge Instructions: Apply Unnaboot as directed. 1. Apligraf #3 2. Product manager) Signed: 05/08/2020 5:06:48 PM By: Linton Ham MD Entered By: Linton Ham on 05/08/2020 09:56:53 -------------------------------------------------------------------------------- SuperBill Details Patient Name: Date of Service: [name], [name] 05/08/2020 Medical Record Number:  [id number] Patient Account Number: [id number] Date of Birth/Sex: Treating RN: Jan 11, 1956 (64 y.o. [name] Primary Care Provider: [name] Other Clinician: Referring Provider: Treating Provider/Extender: [name] in Treatment: 16 Diagnosis Coding ICD-10 Codes Code Description [phone] Chronic venous hypertension (idiopathic) with ulcer and inflammation of left lower extremity L97.828 Non-pressure chronic ulcer of other part of left lower leg with other specified severity L97.328 Non-pressure chronic ulcer of left ankle with other specified severity Facility Procedures CPT4 Code: [id number] Description: (Facility Use Only) Apligraf 1 SQ CM Modifier: Quantity: Bartolo Code: [id number] Description: 63846 - SKIN SUB GRAFT TRNK/ARM/LEG ICD-10 Diagnosis Description L97.328 Non-pressure chronic ulcer of left ankle with other specified severity Modifier: Quantity: 1 Physician Procedures : CPT4 Code Description Modifier [id number] - WC PHYS SKIN SUB GRAFT TRNK/ARM/LEG ICD-10 Diagnosis Description L97.328 Non-pressure chronic ulcer of left ankle with other specified severity Quantity: 1 Electronic Signature(s) Signed: 05/08/2020 5:06:48 PM By: Linton Ham MD Entered By: Linton Ham on 05/08/2020 09:57:16

## 2020-05-22 ENCOUNTER — Encounter (HOSPITAL_BASED_OUTPATIENT_CLINIC_OR_DEPARTMENT_OTHER): Payer: Medicare Other | Admitting: Internal Medicine

## 2020-05-22 ENCOUNTER — Other Ambulatory Visit: Payer: Self-pay

## 2020-05-22 DIAGNOSIS — I87332 Chronic venous hypertension (idiopathic) with ulcer and inflammation of left lower extremity: Secondary | ICD-10-CM | POA: Diagnosis not present

## 2020-05-23 NOTE — Progress Notes (Signed)
EMALYN, SCHOU ([id number]) Visit Report for 05/22/2020 Arrival Information Details Patient Name: Date of Service: [place] [name], [name] 05/22/2020 9:15 A M Medical Record Number: [id number] Patient Account Number: [id number] Date of Birth/Sex: Treating RN: 01-11-1956 (64 y.o. [name], [name] Primary Care Roxanna Mcever: [name] Other Clinician: Referring Caliope Ruppert: Treating Garv Kuechle/Extender: [name] in Treatment: 18 Visit Information History Since Last Visit Added or deleted any medications: No Patient Arrived: Cane Any new allergies or adverse reactions: No Arrival Time: 09:18 Had a fall or experienced change in No Accompanied By: self activities of daily living that may affect Transfer Assistance: None risk of falls: Patient Identification Verified: Yes Signs or symptoms of abuse/neglect since last No Secondary Verification Process Completed: Yes visito Patient Requires Transmission-Based Precautions: No Hospitalized since last visit: No Patient Has Alerts: Yes Implantable device outside of the clinic No Patient Alerts: L ABI =1.12, TBI = .92 excluding R ABI= 1.02, TBI= .58 cellular tissue based products placed in the center since last visit: Has Compression in Place as Prescribed: Yes Has Footwear/Offloading in Place as Yes Prescribed: Left: Removable Cast Walker/Walking Boot Pain Present Now: No Electronic Signature(s) Signed: 05/22/2020 5:55:39 PM By: Deon Pilling Entered By: Deon Pilling on 05/22/2020 09:22:49 -------------------------------------------------------------------------------- Compression Therapy Details Patient Name: Date of Service: [name], [name] 05/22/2020 9:15 A M Medical Record Number: [id number] Patient Account Number: [id number] Date of Birth/Sex: Treating RN: 1955-10-15 (64 y.o. [name] Primary Care Eriel Doyon: [name] Other Clinician: Referring Zamorah Ailes: Treating Wofford Stratton/Extender:  [name] in Treatment: 18 Compression Therapy Performed for Wound Assessment: Wound #15 Left,Medial Malleolus Performed By: Clinician [name], RN Compression Type: Rolena Infante Post Procedure Diagnosis Same as Pre-procedure Electronic Signature(s) Signed: 05/23/2020 5:16:00 PM By: [name] RN Entered By: [name] on 05/22/2020 10:04:24 -------------------------------------------------------------------------------- Encounter Discharge Information Details Patient Name: Date of Service: [name], [name] 05/22/2020 9:15 A M Medical Record Number: [id number] Patient Account Number: [id number] Date of Birth/Sex: Treating RN: 07/27/1955 (64 y.o. [name] Primary Care Tim Corriher: [name] Other Clinician: Referring Klare Criss: Treating Tramaine Snell/Extender: [name] in Treatment: 18 Encounter Discharge Information Items Post Procedure Vitals Discharge Condition: Stable Temperature (F): 97.7 Ambulatory Status: Cane Pulse (bpm): 66 Discharge Destination: Home Respiratory Rate (breaths/min): 16 Transportation: Private Auto Blood Pressure (mmHg): 103/66 Accompanied By: self Schedule Follow-up Appointment: Yes Clinical Summary of Care: Electronic Signature(s) Signed: 05/22/2020 5:55:39 PM By: Deon Pilling Entered By: Deon Pilling on 05/22/2020 10:46:48 -------------------------------------------------------------------------------- Lower Extremity Assessment Details Patient Name: Date of Service: [facility] [name], [name] 05/22/2020 9:15 A M Medical Record Number: [id number] Patient Account Number: [id number] Date of Birth/Sex: Treating RN: Mar 01, 1956 (64 y.o. [name] Primary Care Xariah Silvernail: [name] Other Clinician: Referring Nga Rabon: Treating Adri Schloss/Extender: [name] in Treatment: 18 Edema Assessment Assessed: [name]: Yes] [name]: No] Edema:  [Left: N] [Right: o] Calf Left: Right: Point of Measurement: From Medial Instep 26 cm Ankle Left: Right: Point of Measurement: From Medial Instep 21 cm Vascular Assessment Pulses: Dorsalis Pedis Palpable: [Left:Yes] Electronic Signature(s) Signed: 05/22/2020 5:55:39 PM By: Deon Pilling Entered By: Deon Pilling on 05/22/2020 09:23:28 -------------------------------------------------------------------------------- Multi Wound Chart Details Patient Name: Date of Service: [name], [name] 05/22/2020 9:15 A M Medical Record Number: [id number] Patient Account Number: [id number] Date of Birth/Sex: Treating RN: [birth year] (64 y.o. [name] Primary Care Mushka Laconte: [name] Other Clinician: Referring Miyako Oelke: Treating  Yanis Larin/Extender: [name] in Treatment: 18 Vital Signs Height(in): 68 Pulse(bpm): 66 Weight(lbs): Blood Pressure(mmHg): 103/66 Body Mass Index(BMI): Temperature(F): 97.7 Respiratory Rate(breaths/min): 16 Photos: [15:No Photos Left, Medial Malleolus] [N/A:N/A N/A] Wound Location: [15:Gradually Appeared] [N/A:N/A] Wounding Event: [15:Venous Leg Ulcer] [N/A:N/A] Primary Etiology: [15:Peripheral Venous Disease,] [N/A:N/A] Comorbid History: [15:Osteoarthritis, Confinement Anxiety 12/30/2019] [N/A:N/A] Date Acquired: [15:18] [N/A:N/A] Weeks of Treatment: [15:Open] [N/A:N/A] Wound Status: [15:2.6x1x0.1] [N/A:N/A] Measurements L x W x D (cm) [15:2.042] [N/A:N/A] A (cm) : rea [10:0.712] [N/A:N/A] Volume (cm) : [15:82.50%] [N/A:N/A] % Reduction in Area: [15:97.80%] [N/A:N/A] % Reduction in Volume: [15:Full Thickness With Exposed Support N/A] Classification: [15:Structures Medium] [N/A:N/A] Exudate Amount: [15:Serosanguineous] [N/A:N/A] Exudate Type: [15:red, brown] [N/A:N/A] Exudate Color: [15:Distinct, outline attached] [N/A:N/A] Wound Margin: [15:Large (67-100%)] [N/A:N/A] Granulation Amount: [15:Pink]  [N/A:N/A] Granulation Quality: [15:Small (1-33%)] [N/A:N/A] Necrotic Amount: [15:Fat Layer (Subcutaneous Tissue): Yes N/A] Exposed Structures: [15:Fascia: No Tendon: No Muscle: No Joint: No Bone: No Large (67-100%)] [N/A:N/A] Epithelialization: [15:Cellular or Tissue Based Product] [N/A:N/A] Procedures Performed: [15:Compression Therapy] Treatment Notes Electronic Signature(s) Signed: 05/22/2020 5:57:42 PM By: Linton Ham MD Signed: 05/23/2020 5:16:00 PM By: [name] RN Entered By: Linton Ham on 05/22/2020 10:07:04 -------------------------------------------------------------------------------- Multi-Disciplinary Care Plan Details Patient Name: Date of Service: [facility] [name], [name] 05/22/2020 9:15 A M Medical Record Number: [id number] Patient Account Number: [id number] Date of Birth/Sex: Treating RN: September 27, 1955 (64 y.o. [name], [name] Primary Care Kamorah Nevils: [name] Other Clinician: Referring Mashal Slavick: Treating Malyiah Fellows/Extender: [name] in Treatment: 18 Active Inactive Wound/Skin Impairment Nursing Diagnoses: Impaired tissue integrity Knowledge deficit related to ulceration/compromised skin integrity Goals: Patient/caregiver will verbalize understanding of skin care regimen Date Initiated: 01/17/2020 Target Resolution Date: 06/07/2020 Goal Status: Active Ulcer/skin breakdown will have a volume reduction of 30% by week 4 Date Initiated: 01/17/2020 Date Inactivated: 03/12/2020 Target Resolution Date: 03/09/2020 Goal Status: Met Interventions: Assess patient/caregiver ability to obtain necessary supplies Assess patient/caregiver ability to perform ulcer/skin care regimen upon admission and as needed Assess ulceration(s) every visit Provide education on ulcer and skin care Notes: Electronic Signature(s) Signed: 05/23/2020 5:16:00 PM By: [name] RN Entered By: [name] on 05/22/2020  10:08:25 -------------------------------------------------------------------------------- Pain Assessment Details Patient Name: Date of Service: [name], [name] 05/22/2020 9:15 A M Medical Record Number: [id number] Patient Account Number: [id number] Date of Birth/Sex: Treating RN: Jun 11, 1955 (64 y.o. [name] Primary Care Shaquila Sigman: [name] Other Clinician: Referring Catherine Oak: Treating Lubna Stegeman/Extender: [name] in Treatment: 18 Active Problems Location of Pain Severity and Description of Pain Patient Has Paino No Site Locations Rate the pain. Current Pain Level: 0 Pain Management and Medication Current Pain Management: Medication: No Cold Application: No Rest: No Massage: No Activity: No T.E.N.S.: No Heat Application: No Leg drop or elevation: No Is the Current Pain Management Adequate: Adequate How does your wound impact your activities of daily livingo Sleep: No Bathing: No Appetite: No Relationship With Others: No Bladder Continence: No Emotions: No Bowel Continence: No Work: No Toileting: No Drive: No Dressing: No Hobbies: No Electronic Signature(s) Signed: 05/22/2020 5:55:39 PM By: Deon Pilling Entered By: Deon Pilling on 05/22/2020 09:23:17 -------------------------------------------------------------------------------- Patient/Caregiver Education Details Patient Name: Date of Service: [name], [name] 12/21/2021andnbsp9:15 A M Medical Record Number: [id number] Patient Account Number: [id number] Date of Birth/Gender: Treating RN: January 02, 1956 (64 y.o. [name] Primary Care Physician: [name] Other Clinician: Referring Physician: Treating Physician/Extender: [name] in Treatment: 18 Education Assessment Education  Provided To: Patient Education Topics Provided Wound/Skin Impairment: Methods: Explain/Verbal Responses: State content  correctly Electronic Signature(s) Signed: 05/23/2020 5:16:00 PM By: [name] RN Entered By: [name] on 05/22/2020 10:08:42 -------------------------------------------------------------------------------- Wound Assessment Details Patient Name: Date of Service: [name], [name] 05/22/2020 9:15 A M Medical Record Number: [id number] Patient Account Number: [id number] Date of Birth/Sex: Treating RN: 23-Feb-1956 (64 y.o. [name], [name] Primary Care Tabatha Razzano: [name] Other Clinician: Referring [name]: Treating Lachrisha Ziebarth/Extender: [name] in Treatment: 18 Wound Status Wound Number: 15 Primary Venous Leg Ulcer Etiology: Wound Location: Left, Medial Malleolus Wound Status: Open Wounding Event: Gradually Appeared Comorbid Peripheral Venous Disease, Osteoarthritis, Confinement Date Acquired: 12/30/2019 History: Anxiety Weeks Of Treatment: 18 Clustered Wound: No Wound Measurements Length: (cm) 2.6 Width: (cm) 1 Depth: (cm) 0.1 Area: (cm) 2.042 Volume: (cm) 0.204 % Reduction in Area: 82.5% % Reduction in Volume: 97.8% Epithelialization: Large (67-100%) Tunneling: No Undermining: No Wound Description Classification: Full Thickness With Exposed Support Structures Wound Margin: Distinct, outline attached Exudate Amount: Medium Exudate Type: Serosanguineous Exudate Color: red, brown Foul Odor After Cleansing: No Slough/Fibrino Yes Wound Bed Granulation Amount: Large (67-100%) Exposed Structure Granulation Quality: Pink Fascia Exposed: No Necrotic Amount: Small (1-33%) Fat Layer (Subcutaneous Tissue) Exposed: Yes Necrotic Quality: Adherent Slough Tendon Exposed: No Muscle Exposed: No Joint Exposed: No Bone Exposed: No Treatment Notes Wound #15 (Malleolus) Wound Laterality: Left, Medial Cleanser Peri-Wound Care Topical Primary Dressing apligraf Discharge Instruction: applied by MD : DO NOT REMOVE Secondary  Dressing Woven Gauze Sponge, Non-Sterile 4x4 in Discharge Instruction: Apply over primary dressing as directed. ABD Pad, 5x9 Discharge Instruction: Apply over primary dressing as directed. Drawtex 4x4 in Discharge Instruction: Apply over primary dressing as directed. Secured With Compression Wrap Unnaboot w/Calamine, 4x10 (in/yd) Discharge Instruction: Apply Unnaboot as directed. Compression Stockings Add-Ons Notes MD applied apligraft. drawtex, adaptic, and steri-strips covering the apligraft. Electronic Signature(s) Signed: 05/22/2020 5:55:39 PM By: Deon Pilling Entered By: Deon Pilling on 05/22/2020 09:24:19 -------------------------------------------------------------------------------- Vitals Details Patient Name: Date of Service: [name], [name] 05/22/2020 9:15 A M Medical Record Number: [id number] Patient Account Number: [id number] Date of Birth/Sex: Treating RN: [birth year] (64 y.o. [name], Meta.Reding Primary Care Lacrystal Barbe: [name] Other Clinician: Referring Leolia Vinzant: Treating Paytan Recine/Extender: [name] in Treatment: 18 Vital Signs Time Taken: 09:19 Temperature (F): 97.7 Height (in): 68 Pulse (bpm): 66 Respiratory Rate (breaths/min): 16 Blood Pressure (mmHg): 103/66 Reference Range: 80 - 120 mg / dl Electronic Signature(s) Signed: 05/22/2020 5:55:39 PM By: Deon Pilling Entered By: Deon Pilling on 05/22/2020 09:23:06

## 2020-05-23 NOTE — Progress Notes (Signed)
[name], GWIN ([id number]) Visit Report for 05/22/2020 Cellular or Tissue Based Product Details Patient Name: Date of Service: [facility] [name], [name] 05/22/2020 9:15 A M Medical Record Number: [id number] Patient Account Number: [id number] Date of Birth/Sex: Treating RN: [birth year] (64 y.o. [name], [name] Primary Care Provider: [name] Other Clinician: Referring Provider: Treating Provider/Extender: [name] in Treatment: 18 Cellular or Tissue Based Product Type Wound #15 Left,Medial Malleolus Applied to: Performed By: Physician [name]., MD Cellular or Tissue Based Product Type: Apligraf Level of Consciousness (Pre-procedure): Awake and Alert Pre-procedure Verification/Time Out Yes - 10:00 Taken: Location: trunk / arms / legs Wound Size (sq cm): 2.6 Product Size (sq cm): 44 Waste Size (sq cm): 22 Waste Reason: wound size Amount of Product Applied (sq cm): 22 Instrument Used: Forceps, Scissors Lot #: [id number] Order #: [id number] Expiration Date: 05/26/2020 Fenestrated: Yes Instrument: Blade Reconstituted: Yes Solution Type: NS Solution Amount: 10ML Lot #: [id number] Solution Expiration Date: 11/20/2021 Secured: Yes Secured With: Steri-Strips Dressing Applied: Yes Primary Dressing: adaptic Procedural Pain: 0 Post Procedural Pain: 0 Response to Treatment: Procedure was tolerated well Level of Consciousness (Post- Awake and Alert procedure): Post Procedure Diagnosis Same as Pre-procedure Electronic Signature(s) Signed: 05/22/2020 5:57:42 PM By: Linton Ham MD Signed: 05/23/2020 5:16:00 PM By: [name] RN Entered By: [name] on 05/22/2020 10:10:40 -------------------------------------------------------------------------------- HPI Details Patient Name: Date of Service: [name], ELEA NO R [name]. 05/22/2020 9:15 A M Medical Record Number: [id number] Patient Account Number: [id number] Date of Birth/Sex:  Treating RN: 02-01-1956 (64 y.o. [name] Primary Care Provider: [name] Other Clinician: Referring Provider: Treating Provider/Extender: [name] in Treatment: 18 History of Present Illness HPI Description: the remaining wound is over the left medial ankle. Similar wound over the right medial ankle healed largely with use of Apligraf. Most recently we have been using Hydrofera Blue over this wound with considerable improvement. The patient has been extensively worked up in the past for her venous insufficiency and she is not a candidate for antireflux surgery although I have none of the details available currently. 08/24/14; considerable improvement today. About 50% of this wound areas now epithelialized. The base of the wound appears to be healthier granulation.as opposed to last week when she had deteriorated a considerable improvement 08/17/14; unfortunately the wound has regressed somewhat. The areas of epithelialization from the superior aspect are not nearly as healthy as they were last week. The patient thinks her Hydrofera Blue slipped. 09/07/14; unfortunately the area has markedly regressed in the 2 weeks since I've seen this. There is an odor surrounding erythema. The healthy granulation tissue that we had at the base of the wound now is a dusky color. The nurse reports green drainage 09/14/14; the area looks somewhat better than last week. There is less erythema and less drainage. The culture I did did not show any growth. Nevertheless I think it is better to continue the Cipro and doxycycline for a further week. The remaining wound area was debridement. 09/21/14. Wound did not require debridement last week. Still less erythema and less drainage. She can complete her antibiotics. The areas of epithelialization in the superior aspect of the wound do not look as healthy as they did some weeks ago 10/05/14 continued improvement in the condition of  this wound. There is advancing epithelialization. Less aggressive debridement required 10/19/14 continued improvement in the condition and volume of this wound. Less aggressive debridement to the inferior part of  this to remove surface slough and fibrinous eschar 11/02/14 no debridement is required. The surface granulation appears healthy although some of her islands of epithelialization seem to have regressed. No evidence of infection 11/16/14; lites surface debridement done of surface eschar. The wound does not look to be unhealthy. No evidence of infection. Unfortunately the patient has had podiatry issues in the right foot and for some reason has redeveloped small surface ulcerations in the medial right ankle. Her original presentation involved wounds in this area 11/23/14 no debridement. The area on the right ankle has enlarged. The left ankle wound appears stable in terms of the surface although there is periwound inflammation. There has been regression in the amount of new skin 11/30/14 no debridement. Both wound areas appear healthy. There was no evidence of infection. The the new area on the right medial ankle has enlarged although that both the surfaces appear to be stable. 12/07/14; Debridement of the right medial ankle wound. No no debridement was done on the left. 12/14/14 no major change in and now bilateral medial ankle wounds. Both of these are very painful but the no overt evidence of infection. She has had previous venous ablation 12/21/14; patient states that her right medial ankle wound is considerably more painful last week than usual. Her left is also somewhat painful. She could not tolerate debridement. The right medial ankle wound has fibrinous surface eschar 12/28/14 this is a patient with severe bilateral venous insufficiency ulcers. For a considerable period of time we actually had the one on the right medial ankle healed however this recently opened up again in June. The left  medial ankle wound has been a refractory area with some absent flows. We had some success with Hydrofera Blue on this area and it literally closed by 50% however it is recently opened up Foley. Both of these were debridement today of surface eschar. She tolerates this poorly 01/25/15: No change in the status of this. Thick adherent escar. Very poor tolerance of any attempt at debridement. I had healed the right medial malleolus wound for a considerable amount of time and had the left one down to about 50% of the volume although this is totally regressed over the last 48 weeks. Further the right leg has reopened. she is trying to make a appointment with pain and vascular, previous ablations with Dr. Aleda Grana. I do not believe there is an arterial insufficiency issue here 02/01/15 the status of the adherent eschar bilaterally is actually improved. No debridement was done. She did not manage to get vascular studies done 02/08/15 continued debridement of the area was done today. The slough is less adherent and comes off with less pressure. There is no surrounding infection peripheral pulses are intact 02/15/15 selective debridement with a disposable curette. Again the slough is less adherent and comes off with less difficulty. No surrounding infection peripheral pulses are intact. 02/22/15 selective debridement of the right medial ankle wound. Slough comes off with less difficulty. No obvious surrounding infection peripheral pulses are intact I did not debridement the one on the left. Both of these are stable to improved 03/01/15 selective debridement of both wound areas using a curette to. Adherent slough cup soft with less difficulty. No obvious surrounding infection. The patient tells me that 2 days ago she noted a rash above the right leg wrap. She did not have this on her lower legs when she change this over she arrives with widespread left greater than right almost folliculitis-looking rash which is  extremely pruritic. I don't see anything to culture here. There is no rash on the rest of her body. She feels well systemically. 03/08/15; selective debridement of both wounds using a curette. Base of this does not look unhealthy. She had limegreen drainage coming out of the left leg wound and describes a lot of drainage. The rash on her left leg looks improved to. No cultures were done. 03/22/15; patient was not here last week. Basal wounds does not look healthy and there is no surrounding erythema. No drainage. There is still a rash on the left leg that almost looks vasculitic however it is clearly limited to the top of where the wrap would be. 04/05/15; on the right required a surgical debridement of surface eschar and necrotic subcutaneous tissue. I did not debridement the area on the left. These continue to be large open wounds that are not changing that much. We were successful at one point in healing the area on the right, and at the same time the area on the left was roughly half the size of current measurements. I think a lot of the deterioration has to do with the prolonged time the patient is on her feet at work 04/19/15 I attempted-like surface debridement bilaterally she does not tolerate this. She tells me that she was in allergic care yesterday with extreme pain over her left lateral malleolus/ankle and was told that she has an "sprain" 05/03/15; large bilateral venous insufficiency wounds over the medial malleolus/medial aspect of her ankles. She complains of copious amounts of drainage and his usual large amounts of pain. There is some increasing erythema around the wound on the right extending into the medial aspect of her foot to. historically she came in with these wounds the right one healed and the left one came down to roughly half its current size however the right one is reopened and the left is expanded. 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 05/10/15 large bilateral venous insufficiency wounds. There is less adherence surface left however the surface culture that I did last week grew pseudomonas therefore bilateral selective debridement score necessary. There is surrounding erythema. The patient describes severe bilateral drainage and a lot of pain in the left ankle. Apparently her podiatrist was were ready to do a cortisone shot 05/17/15; the patient complains of pain and again copious amounts of drainage. 05/24/15; we used Iodo flex last week. Patient notes considerable improvement in wound drainage. Only needed to change this once. 05/31/15; we continued Iodoflex; the base of these large wounds bilaterally is not too bad but there is probably likely a significant bioburden here. I would like to debridement just doesn't tolerate it. 06/06/14 I would like to continue the Iodoflex although she still hasn't managed to obtain supplies. She has bilateral medial malleoli or large wounds which are mostly superficial. Both of them are covered circumferentially with some nonviable fibrinous slough although she tolerates debridement very poorly. She apparently has an appointment for an ablation on the right leg by interventional radiology. 06/14/15; the patient arrives with the wounds and static condition. We attempted a debridement although she does not do well with this secondary to pain. I 07/05/15; wounds are not much smaller however there appears to be a cleaner granulating base. The left has tight fibrinous slough greater than the right. Debridement is tolerated poorly due to pain. Iodoflex is done more for these wounds in any of  the multitude of different dressings I have tried on the left 1 and then subsequently the right. 07/12/15; no change in the condition of this wound. I am able to do an aggressive debridement on the right but not the left. She simply cannot tolerate it. We have been using Iodoflex which helps somewhat.  It is worthwhile remembering that at one point we healed the right medial ankle wound and the left was about 25% of the current circumference. We have suggested returning to vascular surgery for review of possible further ablations for one reason or another she has not been able to do this. 07/26/15 no major change in the condition of either wound on her medial ankle. I did not attempt to debridement of these. She has been aggressively scrubbing these while she is in the shower at home. She has her supply of Iodoflex which seems to have done more for these wounds then anything I have put on recently. 08/09/15 wound area appears larger although not verified by measurements. Using Iodoflex 09/05/2015 -- she was here for avisit today but had significant problems with the wound and I was asked to see her for a physician opinion. I have summarize that this lady has had surgery on her left lower extremity about 10 years ago where the possible veins stripping was done. She has had an opinion from interventional radiology around November 2016 where no further sclerotherapy was ordered. The patient works 12 hours a day and stands on a concrete floor with work boots and is unable to get the proper compression she requires and cannot elevate her limbs appropriately at any given time. She has recently grown Pseudomonas from her wound culture but has not started her ciprofloxacin which was called in for her. 09/13/15 this continues to be a difficult situation for this patient. At one point I had this wound down to a 1.5 x 1.5" wound on her left leg. This is deteriorated and the right leg has reopened. She now has substantial wounds on her medial calcaneus, malleoli and into her lower leg. One on the left has surface eschar but these are far too painful for me to debridement here. She has a vascular surgery appointment next week to see if anything can be done to help here. I think she has had previous ablations  several years ago at [facility]. She has no major edema. She tells me that she did not get product last time [facility]] and went for several days without it. She continues to work in work boots 12 hours a day. She cannot get compression/4-layer under her work boots. 09/20/15 no major change. Periwound edema control was not very good. Her point with pain and vascular is next Wednesday the 25th 09/28/15; the patient is seen vascular surgery and is apparently scheduled for repeat duplex ultrasounds of her bilateral lower legs next week. 10/05/15; the patient was seen by Dr. Doren Custard of vascular surgery. He feels that she should have arterial insufficiency excluded as cause/contributed to her nonhealing stage she is therefore booked for an arteriogram. She has apparently monophasic signals in the dorsalis pedis pulses. She also of course has known severe chronic venous insufficiency with previous procedures as noted previously. I had another long discussion with the patient today about her continuing to work 12 hour shifts. I've written her out for 2 months area had concerns about this as her work location is currently undergoing significant turmoil and this may lead to her termination. She is aware of  this however I agree with her that she simply cannot continue to stand for 12 hours multiple days a week with the substantial wound areas she has. 10/19/15; the Dr. Doren Custard appointment was largely for an arteriogram which was normal. She does not have an arterial issue. He didn't make a comment about her chronic venous insufficiency for which she has had previous ablations. Presumably it was not felt that anything additional could be done. The patient is now out of work as I prescribed 2 weeks ago. Her wounds look somewhat less aggravated presumably because of this. I felt I would give debridement another try today 10/25/15; no major change in this patient's wounds. We are struggling to get her product that she  can afford into her own home through her insurance. 11/01/15; no major change in the patient's wounds. I have been using silver alginate as the most affordable product. I spoke to Dr. Marla Roe last week with her requested take her to the OR for surgical debridement and placement of ACEL. Dr. Marla Roe told me that she would be willing to do this however [employer] will not cover this, fortunately the patient has [place] healthcare of some variant 11/08/15; no major change in the patient's wounds. She has been completely nonviable surface that this but is in too much pain with any attempted debridement are clinic. I have arranged for her to see Dr. Marla Roe ham of plastic surgery and this appointment is on Monday. I am hopeful that they will take her to the OR for debridement, possible ACEL ultimately possible skin graft 11/22/15 no major change in the patient's wounds over her bilateral medial calcaneus medial malleolus into the lower legs. Surface on these does not look too bad however on the left there is surrounding erythema and tenderness. This may be cellulitis or could him sleepy tinea. 11/29/15; no major changes in the patient's wounds over her bilateral medial malleolus. There is no infection here and I don't think any additional antibiotics are necessary. There is now plan to move forward. She sees Dr. Marla Roe in a week's time for preparation for operative debridement and ACEL placement I believe on 7/12. She then has a follow-up appointment with Dr. Marla Roe on 7/21 12/28/15; the patient returns today having been taken to the [place] by Dr. Marla Roe 12/12/15 she underwent debridement, intraoperative cultures [which were negative]. She had placement of a wound VAC. Parent really ACEL was not available to be placed. The wound VAC foam apparently adhered to the wound since then she's been using silver alginate, Xeroform under Ace wraps. She still says there is a lot of drainage and a  lot of pain 01/31/16; this is a patient I see monthly. I had referred her to Dr. Marla Roe him of plastic surgery for large wounds on her bilateral medial ankles. She has been to the OR twice once in early July and once in early August. She tells me over the last 3 weeks she has been using the wound VAC with ACEL underneath it. On the right we've simply been using silver alginate. Under Kerlix Coban wraps. 02/28/16; this is a patient I'm currently seeing monthly. She is gone on to have a skin graft over her large venous insufficiency ulcer on the left medial ankle. This was done by Dr. Marla Roe him. The patient is a bit perturbed about why she didn't have one on her right medial ankle wound. She has been using silver alginate to this. 03/06/16; I received a phone call from  her plastic surgery Dr. Marla Roe. She expressed some concern about the viability of the skin graft she did on the left medial ankle wound. Asked me to place Endoform on this. She told me she is not planning to do a subsequent skin graft on the right as the left one did not take very well. I had placed Hydrofera Blue on the right 03/13/16; continue to have a reasonably healthy wound on the right medial ankle. Down to 3 mm in terms of size. There is epithelialization here. The area on the left medial ankle is her skin graft site. I suppose the last week this looks somewhat better. She has an open area inferiorly however in the center there appears to be some viable tissue. There is a lot of surface callus and eschar that will eventually need to come off however none of this looked to be infected. Patient states that the is able to keep the dressing on for several days which is an improvement. 03/20/16 no major change in the circumference of either wound however on the left side the patient was at Dr. Eusebio Friendly office and they did a debridement of left wound. 50% of the wound seems to be epithelialized. I been using Endoform on  the left Hydrofera Blue in the right 03/27/16; she arrives today with her wound is not looking as healthy as they did last week. The area on the right clearly has an adherent surface to this a very similar surface on the left. Unfortunately for this patient this is all too familiar problem. Clearly the Endoform is not working and will need to change that today that has some potential to help this surface. She does not tolerate debridement in this clinic very well. She is changing the dressing wants 04/03/16; patient arrives with the wounds looking somewhat better especially on the right. Dr. Migdalia Dk change the dressing to silver alginate when she saw her on Monday and also sold her some compression socks. The usefulness of the latter is really not clear and woman with severely draining wounds. 04/10/16; the patient is doing a bit of an experiment wearing the compression stockings that Dr. Migdalia Dk provided her to her left leg and the out of legs based dressings that we provided to the right. 05/01/16; the patient is continuing to wear compression stockings Dr. Migdalia Dk provided her on the left that are apparently silver impregnated. She has been using Iodoflex to the right leg wound. Still a moderate amount of drainage, when she leaves here the wraps only last for 4 days. She has to change the stocking on the left leg every night 05/15/16; she is now using compression stockings bilaterally provided by Dr. Marla Roe. She is wearing a nonadherent layer over the wounds so really I don't think there is anything specific being done to this now. She has some reduction on the left wound. The right is stable. I think all healing here is being done without a specific dressing 06/09/16; patient arrives here today with not much change in the wound certainly in diameter to large circular wounds over the medial aspect of her ankle bilaterally. Under the light of these services are certainly not viable for healing. There  is no evidence of surrounding infection. She is wearing compression stockings with some sort of silver impregnation as prescribed by Dr. Marla Roe. She has a follow-up with her tomorrow. 06/30/16; no major change in the size or condition of her wounds. These are still probably covered with a nonviable surface. She is  using only her purchase stockings. She did see Dr. Marla Roe who seemed to want to apply Dakin's solution to this I'm not extreme short what value this would be. I would suggest Iodoflex which she still has at home. 07/28/16; I follow Mrs. Boling episodically along with Dr. Marla Roe. 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 She has been applying a topical collagen ointment to both wounds with Adaptic. I don't think Dr. Marla Roe is planning to take her back to the OR. 08/19/16; I follow Mrs. Jeneen Rinks on a monthly basis along with Dr. Marla Roe of plastic surgery. 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 I been following her for a number of years. At one point I was able to get the right medial malleolus wound to heal and had the left medial malleolus down to about half its current size however and I had to send her to plastic surgery for an operative debridement. Since then things have been stable to slightly improve the area on the right is slightly better one in the left about the same although there is much less adherent surface than I'm used to with this patient. She is using some form of liquid collagen gel that Dr. Marla Roe provided a Kerlix cover with the patient's own pressure stockings. She tells me that she has extreme pain in both ankles and along the lateral aspect of both feet. She has been unable to work for some period of time. She is telling me she is retiring at the beginning of April. She sees Dr. Doran Durand of orthopedics next  week 09/22/16; patient has not seen Dr. Marla Roe since the last time she is here. I'm not really sure what she is using to the wounds other than bits and pieces of think she had left over including most recently Hydrofera Blue. She is using juxtalite stockings. She is having difficulty with her husband's recent illness "stroke". She is having to transport him to various doctors appointments. Dr. Marla Roe left her the option of a repeat debridement with ACEL however she has not been able to get the time to follow-up on this. She continues to have a fair amount of drainage out of these wounds with certainly precludes leaving dressings on all week 10/13/16; patient has not seen Dr. Marla Roe since she was last in our clinic. I'm not really sure what she is doing with the wounds, we did try to get her [facility] and I think she is actually using this most of the time. Because of drainage she states she has to change this every second day although this is an improvement from what she used to do. She went to see Dr. Doran Durand who did not think she had a muscular issue with regards to her feet, he referred her to a neurologist and I think the appointment is sometime in June. I changed her back to Iodoflex which she has used in the past but not recently. 11/03/16; the patient has been using Iodoflex although she ran out of this. Still claims that there is a lot of drainage although the wound does not look like this. No surrounding erythema. She has not been back to see Dr. Marla Roe 11/24/16; the patient has been using Iodoflex again but she ran out of it 2 or 3 days ago. There is no major change in the condition of either one of these wounds in fact they are larger  and covered in a thick adherent surface slough/nonviable tissue especially on the left. She does not tolerate mechanical debridement in our clinic. Going back to see Dr. Marla Roe of plastic surgery for an operative debridement would seem  reasonable. 12/15/16; the patient has not been back to see Dr. Marla Roe. She is been dealing with a series of illnesses and her husband which of monopolized her time. She is been using Sorbact which we largely supplied. She states the drainage is bad enough that it maximum she can go 2-3 days without changing the dressing 01/12/2017 -- the patient has not been back for about 4 weeks and has not seen Dr. Marla Roe not does she have any appointment pending. 01/23/17; patient has not seen Dr. Marla Roe even though I suggested this previously. She is using Santyl that was suggested last week by Dr. Con Memos this Cost her $16 through her insurance which is indeed surprising 02/12/17; continuing Santyl and the patient is changing this daily. A lot of drainage. She has not been back to see plastic surgery she is using an Ace wrap. Our intake nurse suggested wrap around stockings which would make a good reasonable alternative 02/26/17; patient is been using Santyl and changing this daily due to drainage. She has not been to see plastic surgery she uses in April Ace wrap to control the edema. She did obtain extremitease stockings but stated that the edema in her leg was to big for these 03/20/17; patient is using Santyl and Anasept. Surfaces looked better today the area on the right is actually measuring a little smaller. She has states she has a lot of pain in her feet and ankles and is asking for a consult to pain control which I'll try to help her with through our case manager. 04/10/17; the patient arrives with better-looking wound surfaces and is slightly smaller wound on the left she is using a combination of Santyl and Anasept. She has an appointment or at least as started in the pain control center associated with [facility] 05/14/17; this is a patient who I followed for a prolonged period of time. 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 At one point I had this down to a  much smaller wound on the left however these reopened and we've never been able to get these to heal. She has been using Santyl and Anasept gel although 2 weeks ago she ran out of the Anasept gel. She has a stable appearance of the wound. She is going to the wound care clinic at [facility]. They wanted do a nerve block/spinal block although she tells me she is reluctant to go forward with that. 05/21/17; this is a patient I have followed for many years. 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 Chronic pain and deformity in her ankles as well. She is been to see plastic surgery as well as orthopedics. Using PolyMem AG most recently/Kerramax/ABDs and 2 layer compression. She has managed to keep this on and she is coming in for a nurse check to change the dressing on Tuesdays, we see her on Fridays 06/05/17; really quite a good looking surface and the area especially on the right medial has contracted in terms of dimensions. Well granulated healthy-looking tissue on both sides. Even with an open curet there is nothing that even feels abnormal here. This is as good as I've seen this in quite some time. We have been using PolyMem AG and bringing her in for a nurse  check 06/12/17; really quite good surface on both of these wounds. The right medial has contracted a bit left is not. We've been using PolyMem and AG and she is coming in for a nurse visit 06/19/17; we have been using PolyMem AG and bringing her in for a nurse check. Dimensions of her wounds are not better but the surfaces looked better bilaterally. She complained of bleeding last night and the left wound and increasing pain bilaterally. She states her wound pain is more neuropathic than just the wounds. There was some suggestion that this was radicular from her pain management doctor in talking to her it is really difficult to sort this out. 06/26/17; using PolyMem and AG and bringing her in for a nurse check as All  of this and reasonably stable condition. Certainly not improved. The dimensions on the lateral part of the right leg look better but not really measuring better. The medial aspect on the left is about the same. 07/03/16; we have been using PolyMen AG and bringing her in for a nurse check to change the dressings as the wounds have drainage which precludes once weekly changing. We are using all secondary absorptive dressings.our intake nurse is brought up the idea of using a wound VAC/snap VAC on the wound to help with the drainage to see if this would result in some contraction. This is not a bad idea. The area on the right medial is actually looking smaller. Both wounds have a reasonable-looking surface. There is no evidence of cellulitis. The edema is well controlled 07/10/17; the patient was denied for a snap VAC by her insurance. The major issue with these wounds continues to be drainage. We are using wicked PolyMem AG and she is coming in for a nurse visit to change this. The wounds are stable to slightly improved. The surface looks vibrant and the area on the right certainly has shrunk in size but very slowly 07/17/17; the patient still has large wounds on her bilateral medial malleoli. Surface of both of these wounds looks better. The dimensions seem to come and go but no consistent improvement. There is no epithelialization. We do not have options for advanced treatment products due to insurance issues. They did not approve of the wound VAC to help control the drainage. More recently we've been using PolyMem and AG wicked to allow drainage through. We have been bringing her in for a nurse visit to change this. We do not have a lot of options for wound care products and the home again due to insurance issues 07/24/17; the patient's wound actually looks somewhat better today. No drainage measurements are smaller still healthy-looking surface. We used silver collagen under PolyMen started last week. We  have been bringing her in for a dressing change 07/31/17; patient's wound surface continued to look better and I think there is visible change in the dimensions of the wound on the right. Rims of epithelialization. We have been using silver collagen under PolyMen and bringing her in for a dressing change. There appears to be less drainage although she is still in need of the dressing change 08/07/17. Patient's wound surface continues to look better on both sides and the area on the right is definitely smaller. We have been using silver collagen and PolyMen. She feels that the drainage has been it has been better. I asked her about her vascular status. She went to see Dr. Aleda Grana at [facility] and had some form of ablation. I don't have much detail  on this. I haven't my notes from 2016 that she was not a candidate for any further ablation but I don't have any more information on this. We had referred her to vein and vascular I don't think she ever went. He does not have a history of PAD although I don't have any information on this either. We don't even have ABIs in our record 08/14/17; we've been using silver collagen and PolyMen cover. And putting the patient and compression. She we are bringing her in as a nurse visit to change this because ofarge amount of drainage. We didn't the ABIs in clinic today since they had been done in many moons 1.2 bilaterally. She has been to see vein and vascular however this was at [facility] and she had ablation although I really don't have any information on this all seemed biking get a report. She is also been operatively debrided by plastic surgery and had a cell placed probably 8-12 months ago. This didn't have a major effect. We've been making some gains with current dressings 08/19/17-She is here in follow-up evaluation for bilateral medial malleoli ulcers. She continues to tolerate debridement very poorly. We will continue with recently changed topical  treatment; if no significant improvement may consider switching to Iodosorb/Iodoflex. 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 She has been using silver collagen and PolyMem. I believe she has been used and tried on Iodoflex before. 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 These have been very refractory over time. We have been using silver collagen and PolyMen. She is coming in once a week for a doctor's and once a week for nurse visits. We are actually making some progress 09/18/17; the patient's wounds are smaller especially on the right medial. She arrives today to upset to consider even washing these off with Anasept which I think is been part of the reason this is been closing. We've been using collagen covered in PolyMen otherwise. It is noted that she has a small area of folliculitis on the right medial calf that. As we are wrapping her legs I'll give her a short course of doxycycline to make sure this doesn't amount to anything. She is a long list of complaints today including imbalance, shortness of breath on exertion, inversion of her left ankle. With regards to the latter complaints she is been to see orthopedics and they offered her a tendon release surgery I believe but wanted her wounds to be closed first. I have recommended she go see her primary physician with regards to everything else. 09/25/17; patient's wounds are about the same size. We have made some progress bilaterally although not in recent weeks. She will not allow me T wash these wounds with Anasept even if she is doing her cell. Wheeze we've been using collagen covered in PolyMen. Last week she had a small area of folliculitis this is now opened into a small wound. She completed 5  days of trimethoprim sulfamethoxazole 10/02/17; unfortunately the area on her left medial ankle is worse with a larger wound area towards the Achilles. The patient complains of a lot of pain. She will not allow debridement although visually I don't think there is anything to debridement in any case. We have been using silver collagen and PolyMen for several months  now. Initially we are making some progress although I'm not really seeing that today. We will move back to [facility]. His admittedly this is a bit of a repeat however I'm hoping that his situation is different now. The patient tells me she had her leg on the left give out on her yesterday this is process some pain. 10/09/17; the patient is seen twice a week largely because of drainage issues coming out of the chronic medial bimalleolar wounds that are chronic. Last week the dimensions of the one on the left looks a little larger I changed her to [facility]. She comes in today with a history of terrible pain in the bilateral wound areas. She will not allow debridement. She will not even allow a tissue culture. There is no surrounding erythema no no evidence of cellulitis. We have been putting her Kerlix Coban man. She will not allow more aggressive compression as there was a suggestion to put her in 3 layer wraps. 10/16/17; large wounds on her bilateral medial malleoli. These are chronic. Not much change from last week. The surface looks have healthy but absolutely no epithelialization. A lot of pain little less so of drainage. She will not allow debridement or even washing these off in the vigorous fashion with Anasept. 10/23/17; large wounds on her bilateral malleoli which are chronic. Some improvement in terms of size perhaps on the right since last time I saw these. She states that after we increased the 3 layer compression there was some bleeding, when she came in for a nurse visit she did not want 3 layer compression put back on  about our nurse managed to convince her. She has known chronic venous visit issues and I'm hoping to get her to tolerate the 3 layer compression. using Hydrofera Blue 10/30/17; absolutely no change in the condition of either wound although we've had some improvement in dimensions on the right.. Attempted to put her in 3 layer compression she didn't tolerated she is back in 2 layer compression. We've been using Hydrofera Blue We looked over her past records. She had venous reflux studies in November 2016. There was no evidence of deep venous reflux on the right. Superficial vein did not show the greater saphenous vein at think this is been previously ablated the small saphenous vein was within normal limits. The left deep venous system showed no DVT the vessels were positive for deep venous reflux in the posterior tibial veins at the ankle. The greater saphenous vein was surgically absent small saphenous vein was within normal limits. She went to vein and vascular at [facility]. I believe she had an ablation on the left greater saphenous vein. I'll update her reflux studies perhaps ever reviewed by vein and vascular. We've made absolutely no progress in these wounds. Will also try to read and TheraSkins through her insurance 11/06/17; W the patient apparently has a 2 week follow-up with vein and vascular I like him to review the whole issue with regards to her previous vascular workup by Dr. Aleda Grana. We've really made no progress on these wounds in many months. She arrives today with less viable looking surface on the left medial ankle wound. This was apparently looking about the same on Tuesday when she was here for nurse visit. 11/13/17; deep tissue culture I did last time of the left lower leg showed multiple organisms without any predominating. In particular no Staphylococcus or group A strep were isolated. We sent her for venous reflux studies. She's had  a previous left greater saphenous vein  stripping and I think sclerotherapy of the right greater saphenous vein. She didn't really look at the lesser saphenous vein this both wounds are on the medial aspect. She has reflux in the common femoral vein and popliteal vein and an accessory vein on the right and the common femoral vein and popliteal vein on the left. I'm going to have her go to see vein and vascular just the look over things and see if anything else beside aggressive compression is indicated here. We have not been able to make any progress on these wounds in spite of the fact that the surface of the wounds is never look too bad. 11/20/17; no major change in the condition of the wounds. Patient reports a large amount of drainage. She has a lot of complaints of pain although enlisting her today I wonder if some of this at least his neuropathic rather than secondary to her wounds. She has an appointment with vein and vascular on 12/30/17. The refractory nature of these wounds in my mind at least need vein and vascular to look over the wounds the recent reflux studies we did and her history to see if anything further can be done here. I also note her gait is deteriorated quite a bit. 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 She has a bilateral Trendelenburg gait. I wonder if this is neuropathic or perhaps multilevel radicular. 11/27/17; her wounds actually looks slightly better. Healthy-looking granulation tissue a scant amount of epithelialization. [place] [employer] will not pay for [employer]. They will play for tri layer Oasis and Dermagraft. This is not a diabetic ulcer. We'll try for the tri layer Oasis. She still complains of some drainage. She has a vein and vascular appointment on 12/30/17 12/04/17; the wounds visually look quite good. Healthy-looking granulation with some degree of epithelialization. We are still waiting for response to our request for trial to try layer Oasis. Her appointment with vascular to review  venous and arterial issues isn't sold the end of July 7/31. Not allow debridement or even vigorous cleansing of the wound surface. 12/18/17; slightly smaller especially on the right. 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 We've been using Hydrofera Blue. We still are looking into triple layer Oasis through her insurance 01/08/18 on evaluation today patient's wound actually appears to be showing signs of good improvement at this point in time. She has been tolerating the dressing changes without complication. Fortunately there does not appear to be any evidence of infection at this point in time. We have been utilizing silver nitrate which does seem to be of benefit for her which is also good news. Overall I'm very happy with how things seem to be both regards appearance as well as measurement. Patient did see Dr. Bridgett Larsson for evaluation on 12/30/17. In his assessment he felt that stripping would not likely add much more than chronic compression to the patient's healing process. His recommendation was to follow-up in three months with Dr. Doren Custard if she hasn't healed in order to consider referral back to you and see vascular where she previously was in a trial and was able to get her wound to heal. I'll be see what she feels she when you staying compression and he reiterated this as well. 01/13/18 on evaluation today patient appears to actually be doing very well in regard to her bilateral medial malleolus ulcers. She seems to have tolerated the chemical cauterization with silver nitrate last  week she did have some pain through that evening but fortunately states that I'll be see since it seems to be doing better she is overall pleased with the progress. 01/21/18; really quite a remarkable improvement since I've last seen these wounds. We started using silver nitrate specially on the islands of hyper granulation which for some reason her around the wound circumference. This is  really done quite nicely. Primary dressing Hydrofera Blue under 4 layer compression. She seems to be able to hold out without a nurse rewrap. Follow-up in 1 week 01/28/18; we've continued the hydrofera blue but continued with chemical cauterization to the wound area that we started about a month ago for irregular hyper granulation. She is made almost stunning improvement in the overall wound dimensions. I was not really expecting this degree of improvement in these chronic wounds 02/05/18; we continue with Hydrofera Bluebut of also continued the aggressive chemical cauterization with silver nitrate. We made nice progress with the right greater than left wound. 02/12/18. We continued with Hydrofera Blue after aggressive chemical cauterization with silver nitrate. We appear to be making nice progress with both wound areas 02/19/2018; we continue with [facility] after washing the wounds vigorously with Anasept spray and chemical cauterization with silver nitrate. We are making excellent progress. The area on the right's just about closed 02/26/2018. The area on the left medial ankle had too much necrotic debris today. I used a #5 curette we are able to get most of the soft. I continued with the silver nitrate to the much smaller wound on the right medial ankle she had a new area on her right lower pretibial area which she says was due to a role in her compression 03/05/2018; both wound areas look healthy. Not much change in dimensions from last week. I continue to use silver nitrate and Hydrofera Blue. The patient saw Dr. Doren Custard of vein and vascular. He felt she had venous stasis ulcers. He felt based on her previous arteriogram she should have adequate circulation for healing. Also she has deep venous reflux but really no significant correctable superficial venous reflux at this time. He felt we should continue with conservative management including leg elevation and compression 04/02/2018; since we last  saw this woman about a month ago she had a fall apparently suffered a pelvic fracture. I did not look up the x-ray. Nevertheless because of pain she literally was bedbound for 2 weeks and had home health coming out to change the dressing. Somewhat predictably this is resulted in considerable improvement in both wound areas. The right is just about closed on the medial malleolus and the left is about half the size. 04/16/2018; both her wounds continue to go down in size. Using Hydrofera Blue. 05/07/18; both her wounds appeared to be improving especially on the right where it is almost closed. We are using Hydrofera Blue 05/14/2018; slightly worse this week with larger wounds. Surface on the left medial not quite as good. We have been using Hydrofera Blue 05/21/18; again the wounds are slightly larger. Left medial malleolus slightly larger with eschar around the circumference. We have been using Hydrofera Blue undergoing a wraps for a prolonged period of time. This got a lot better when she was more recumbent due to a fall and a back injury. I change the primary dressing the silver alginate today. She did not tolerate a 4 layer compression previously although I may need to bring this up with her next time 05/28/2018; area on the left medial  malleolus again is slightly larger with more drainage. Area on the right is roughly unchanged. She has a small area of folliculitis on the right medial just on the lower calf. This does not look ominous. 06/03/2018 left medial malleolus slightly smaller in a better looking surface. We used silver nitrate on this last time with silver alginate. The area on the right appears slightly smaller 1/10; left medial malleolus slightly smaller. Small open area on the right. We used silver nitrate and silver alginate as of 2 weeks ago. We continue with the wound and compression. These got a lot better when she was off her feet 1/17; right medial malleolus wound is smaller. The  left may be slightly smaller. Both surfaces look somewhat better. 1/24; both wounds are slightly smaller. Using silver alginate under Unna boots 1/31; both wounds appear smaller in fact the area on the right medial is just about closed. Surface eschar. We have been using silver alginate under Unna boots. The patient is less active now spends let much less time on her feet and I think this is contributed to the general improvement in the wound condition 2/7; both wounds appear smaller. I was hopeful the right medial would be closed however there there is still the same small open area. Slight amount of surface eschar on the left the dimensions are smaller there is eschar but the wound edges appear to be free. We have been using silver alginate under Unna boot's 2/14; both wounds once again measure smaller. Circumferential eschar on the left medial. We have been using silver alginate under Unna boots with gradual improvement 2/21; the area on the right medial malleolus has healed. The area on the left is smaller. We have been using silver alginate and Unna boots. We can discharge wrapping the right leg she has 20/30 stockings at home she will need to protect the scar tissue in this area 2/28; the area on the right medial malleolus remains closed the patient has a compression stocking. The area on the left is smaller. We have been using silver alginate and Unna boots. 3/6 the area on the right medial ankle remains closed. Good edema control noted she is using her own compression stocking. The area on the left medial ankle is smaller. We have been managing this with silver alginate and Unna boots which we will continue today. 3/13; the area on the right medial ankle remains closed and I'm declaring it healed today. When necessary the left is about the same still a healthy-looking surface but no major change and wound area. No evidence of infection and using silver alginate under unna and generally making  considerable improvement 3/27 the area on the right medial ankle remains closed the area on the left is about the same as last week. Certainly not any worse we have been using silver alginate under an Unna boot 4/3; the area on the right medial ankle remains closed per the patient. We did not look at this wound. The wound on the left medial ankle is about the same surface looks healthy we have been using silver alginate under an Unna boot 4/10; area on the right medial ankle remains closed per the patient. We did not look at this wound. The wound on the left medial ankle is slightly larger. The patient complains that the [facility] caused burning pain all week. She also told us that she was a lot more active this week. Changed her back to silver alginate 4/17; right medial ankle still  closed per the patient. Left medial ankle is slightly larger. Using silver alginate. She did not tolerate Hydrofera Blue on this area 4/24; right medial ankle remains closed we have not look at this. The left medial ankle continues to get larger today by about a centimeter. We have been using silver alginate under Unna boots. She complains about 4 layer compression as an alternative. She has been up on her feet working on her garden 5/8; right medial ankle remains closed we did not look at this. The left medial ankle has increased in size about 100%. We have been using silver alginate under Unna boots. She noted increased pain this week and was not surprised that the wound is deteriorated 5/15; no major change in SA however much less erythema ( one week of doxy ocellulitis). 5/22-64 year old female returns at 1 week to the clinic for left medial ankle wound for which we have been using silver alginate under 3 layer compression She was placed on DOXY at last visit - the wound is wider at this visit. She is in 3 layer compression 5/29; change to [facility] last week. I had given her empiric doxycycline 2 weeks ago  for a week. She is in 3 layer compression. She complains of a lot of pain and drainage on presentation today. 6/5; using Hydrofera Blue. I gave her doxycycline recently empirically for erythema and pain around the wound. Believe her cultures showed enterococcus which not would not have been well covered by doxycycline nevertheless the wound looks better and I don't feel specifically that the enterococcus needs to be covered. She has a new what looks like a wrap injury on her lateral left ankle. 6/12; she is using Hydrofera Blue. She has a new area on the left anterior lower tibial area. This was a wrap injury last week. 6/19; the patient is using Hydrofera Blue. She arrived with marked inflammation and erythema around the wound and tenderness. 12/01/18 on evaluation today patient appears to be doing a little bit better based on what I'm hearing from the standpoint of lassos evaluation to this as far as the overall appearance of the wound is concerned. Then sometime substandard she typically sees Dr. Dellia Nims. Nonetheless overall very pleased with the progress that she's made up to this point. No fevers, chills, nausea, or vomiting noted at this time. 7/10; some improvement in the surface area. Aggressively debrided last week apparently. I went ahead with the debridement today although the patient does not tolerate this very well. We have been using Iodoflex. Still a fair amount of drainage 7/17; slightly smaller. Using Iodoflex. 7/24; no change from last week in terms of surface area. We have been using Iodoflex. Surface looks and continues to look somewhat better 7/31; surface area slightly smaller better looking surface. We have been using Iodoflex. This is under Unna boot compression 8/7-Patient presents at 1 week with Unna boot and Iodoflex, wound appears better 8/14-Patient presents at 1 week with Iodoflex, we use the Unna boot, wound appears to be stable better.Patient is getting Botox treatment  for the inversion of the foot for tendon release, Next week 8/21; we are using Iodoflex. Unna boot. The wound is stable in terms of surface area. Under illumination there is some areas of the wound that appear to be either epithelialized or perhaps this is adherent slough at this point I was not really clear. It did not wipe off and I was reluctant to debride this today. 8/28; we are using Iodoflex in an  Unna boot. Seems to be making good improvement. 9/4; using Iodoflex and wound is slightly smaller. 9/18; we are using Iodoflex with topical silver nitrate when she is here. The wound continues to be smaller 10/2; patient missed her appointment last week due to GI issues. She left and Iodoflex based dressing on for 2 weeks. Wound is about the same size about the size of a dime on the left medial lower 10/9 we have been using Iodoflex on the medial left ankle wound. She has a new superficial probable wrap injury on the dorsal left ankle 10/16; we have been using Hydrofera Blue since last week. This is on the left medial ankle 10/23; we have been using Hydrofera Blue since 2 weeks ago. This is on the left medial ankle. Dimensions are better 11/6; using Hydrofera Blue. I think the wound is smaller but still not closed. Left medial ankle 11/13; we have been using Hydrofera Blue. Wound is certainly no smaller this week. Also the surface not as good. This is the remanent of a very large area on her left medial ankle. 11/20; using Sorbact since last week. Wound was about the same in terms of size although I was disappointed about the surface debris 12/11; 3-week follow-up. Patient was on vacation. Wound is measuring slightly larger we have been using Sorbact. 12/18; wound is about the same size however surface looks better last week after debridement. We have been using Sorbact under compression 1/15 wound is probably twice the size of last time increased in length nonviable surface. We have been using  Sorbact. She was running a mild fever and missed her appointment last week 1/22; the wound is come down in size but under illumination still a very adherent debris we have been Hydrofera Blue that I changed her to last week 1/29; dimensions down slightly. We have been using Hydrofera Blue 2/19 dimensions are the same however there is rims of epithelialization under illumination. Therefore more the surface area may be epithelialized 2/26; the patient's wound actually measures smaller. The wound looks healthy. We have been using Hydrofera Blue. I had some thoughts about running Apligraf then I still may do that however this looks so much better this week we will delay that for now 3/5; the wound is small but about the same as last week. We have been using Hydrofera Blue. No debridement is required today. 3/19; the wound is about the size of a dime. Healthy looking wound even under illumination. We have been using Hydrofera Blue. No mechanical debridement is necessary 3/26; not much change from last week although still looks very healthy. We have been using Hydrofera Blue under Unna boots Patient was offered an ankle fusion by podiatry but not until the wound heals with a proceed with this. 4/9; the patient comes in today with her original wound on the medial ankle looking satisfactory however she has some uncontrolled swelling in the middle part of her leg with 2 new open areas superiorly just lateral to the tibia. I think this was probably a wrap issue. She said she felt uncomfortable during the week but did not call in. We have been using Hydrofera Blue 4/16; the wound on the medial ankle is about the same. She has innumerable small areas superior to this across her mid tibia. I think this is probably folliculitis. She is also been working in the yard doing a lot of sweating 4/30; the patient issue on the upper areas across her mid tibia of all healed. I  think this was excessive yard work if I  remember. Her wound on the medial ankle is smaller. Some debris on this we have been using Hydrofera Blue under Unna boots 5/7; mid tibia. She has been using Hydrofera Blue under an Unna wrap. She is apparently going for her ankle surgery on June 3 10/28/19-Patient returns to clinic with the ankle wound, we are using Hydrofera Blue under Unna wrap, surgery is scheduled for her left foot for June 3 so she will be back for nurse visit next week READMISSION 01/17/2020 Mrs. Heuberger is a 64 year old woman we have had in this clinic for a long period of time with severe venous hypertension and refractory wounds on her medial lower legs and ankles bilaterally. 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 When she was off her legs for a prolonged period example when she fell and suffered a compression fracture things would heal up quite nicely. She is now retired and we managed to heal up the right medial leg wound. The left one was very tiny last time I saw this although still refractory. 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 It got to the point where this was interfering with her walking and she elected to proceed with a ankle arthrodesis to straighten her her ankle and leave her with a functional outcome for mobilization. The patient was referred to Dr. Doren Custard and really this took some time to arrange. Dr. Doren Custard saw her on 12/07/2019. Once again he verified that she had no arterial issues. She had previously had an angiogram several years ago. Follow-up ABIs on the left showed an ABI of 1.12 with triphasic waveforms and a TBI of 0.92. She is felt to have chronic deep venous insufficiency but I do not think it was felt that anything could be done from about this from an ablation point of view. At  the time Dr. Doren Custard saw this patient the wounds actually look closed via the pictures in his clinic. The patient finally underwent her surgery on 12/15/2019. This went reasonably well and there was a good anatomic outcome. She developed a small distal wound dehiscence on the lateral part of the surgical wound. However more problematically she is developed recurrence of the wound on the medial left ankle. There are actually 2 wounds here one in the distal lower leg and 1 pretty much at the level of the medial malleolus. It is a more distal area that is more problematic. She has been using Hydrofera Blue which started on Friday before this she was simply Ace wrapping. There was a culture done that showed Pseudomonas and she is on ciprofloxacin. A recent CNS on 8/11 was negative. The patient reports some pain but I generally think this is improving. She is using a cam boot completely nonweightbearing using a walker for pivot transfers and a wheelchair 8/24; not much improvement unfortunately she has a surgical wound on the lateral part in the venous insufficiency wound medially. The bottom part of the medial insufficiency wound is still necrotic there is exposed tendon here. We have been using Hydrofera Blue under compression. Her edema control is however better 8/31; patient in for follow-up of his surgical wound on the lateral part of her left leg and chronic venous insufficiency ulcers medially. We put her  back in compression last week. She comes in today with a complaint of 3 or 4 days worth of increasing pain. She felt her cam walker was rubbing on the area on the back of her heel. However there is intense erythema seems more likely she has cellulitis. She had 2 cultures done when she was seeing podiatry in the postop. One of them in late July showed Pseudomonas and she received a course of ciprofloxacin the other was negative on 8/11 she is allergic to penicillin with anaphylactoid complaints of hives  oral swelling via information in epic 9/9; when I saw this patient last week she had intense anterior erythema around her wound on the right lateral heel and ankle and also into the right medial heel. Some of this was no doubt drainage and her walker boot however I was convinced she had cellulitis. I gave her Levaquin and Bactrim she is finishing up on this now. She is following up with Dr. [name] he saw her yesterday. He is taken her out of the walking boot of course she is still nonweightbearing. Her x-ray was negative for any worrisome features such as soft tissue air etc. Things are a lot better this week. She has home health. We have been using Hydrofera Blue under an [employer] which she put back on yesterday. I did not wrap her last week 9/17; her surrounding skin looks a lot better. In fact the area on the left lateral ankle has just a scant amount of eschar. The only remaining wound is the large area on the left medial ankle. Probably about 60% of this is healthy granulation at the surface however she has a significant divot distally. This has adherent debris in it. I been using debridement and silver collagen to try and get this area to fill-in although I do not think we have made much progress this week 9/24; the patient's wound on the left medial ankle looks a lot better. The deeper divot area distally still requires debridement but this is cleaning up quite nicely we have been using silver collagen. The patient is complaining of swelling in her foot and is worried that that is contributing to the nonhealing of the ankle wound. She is also complaining of numbness in her anterior toes 10/4; left medial ankle. The small area distally still has a divot with necrotic material that I have been debriding away. This has an undermining area. She is approved for Apligraf. She saw Dr. [name] her surgeon on 10/1. I think he declared himself is satisfied with the condition of things. Still  nonweightbearing till the end of the month. 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 Her surgical wound is well closed. There is no evidence of infection 10/11; the patient arrived in clinic today with the expectation that we be able to put an Apligraf on this area after debridement however she arrives with a relatively large amount of green drainage on the dressing. The patient states that this started on Friday. She has not been systemically unwell. 10/19; culture I did last week showed both Enterococcus and Pseudomonas. I think this came in separate parts because I stopped her ciprofloxacin I gave her and prescribed her linezolid however now looking at the final culture result this is Pseudomonas which is resistant to quinolones. She has not yet picked up the linezolid apparently phone issues. We are also trying to get a topical antibiotic out of [place] in [place] they can be applied  by home health. She is still having green drainage 10/16; the patient has her topical antibiotic from [facility] in [place]. This is a compounded gel with vancomycin and ciprofloxacin and gentamicin. We are applying this on the wound bed with silver alginate over the top with Unna boot wraps. She arrives in clinic today with a lot less ominous looking drainage although she is only use this topical preparation once the second time today. She sees Dr. [name] her surgeon on Friday she has home health changing the dressing 11/2; still using her compounded topical antibiotic under silver alginate. Surface is cleaning up there is less drainage. We had an Apligraf for her today and I elected to apply it. A light coating of her antibiotic 04/25/2020 upon evaluation today patient appears to be doing well with regard to her ankle ulcer. There is a little bit of slough noted on the surface of the wound I am can have to perform sharp debridement to clear this away today. With that  being said other than that fact overall I feel like she is making progress and we do see some new epithelial growth. There is also some improvement in the depth of the wound and that distal portion. There is little bit of slough there as well. 12/7; 2-week follow-up. Apligraf #3. Dimensions are smaller. Closing in especially inferiorly. Still some surface debris. Still using the [facility] topical antibiotic but I told her that I don't think this needs to be renewed 12/21; 2-week follow-up. Apligraf #4 dimensions are smaller. [employer] information officer) Signed: 05/22/2020 5:57:42 PM By: Linton Ham MD Entered By: Linton Ham on 05/22/2020 10:08:31 -------------------------------------------------------------------------------- Physical Exam Details Patient Name: Date of Service: [name], ELEA NO R [name]. 05/22/2020 9:15 A M Medical Record Number: [id number] Patient Account Number: [id number] Date of Birth/Sex: Treating RN: [birth year] (64 y.o. [name] Primary Care Provider: [name] Other Clinician: Referring Provider: Treating Provider/Extender: [name] in Treatment: 18 Constitutional Sitting or standing Blood Pressure is within target range for patient.. Pulse regular and within target range for patient.Marland Kitchen Respirations regular, non-labored and within target range.. Temperature is normal and within the target range for the patient.Marland Kitchen Appears in no distress. Cardiovascular Needle pulses palpable. IMA control is good.. Notes Wound exam; dimensions are smaller epithelializing inferiorly. Surface of the wound under illumination looks healthy I applied Apligraf #4 in the standard fashion. Electronic Signature(s) Signed: 05/22/2020 5:57:42 PM By: Linton Ham MD Entered By: Linton Ham on 05/22/2020 10:09:26 -------------------------------------------------------------------------------- Physician Orders Details Patient Name:  Date of Service: [name], ELEA NO R [name]. 05/22/2020 9:15 A M Medical Record Number: [id number] Patient Account Number: [id number] Date of Birth/Sex: Treating RN: 1956/02/16 (64 y.o. [name] Primary Care Provider: [name] Other Clinician: Referring Provider: Treating Provider/Extender: [name] in Treatment: 27 Verbal / Phone Orders: No Diagnosis Coding Follow-up Appointments ppointment in 2 weeks. - on Tuesday's Return A Cellular or Tissue Based Products Cellular or Tissue Based Product Type: - apli graft #4 daptic or Mepitel. (DO NOT REMOVE). - home Cellular or Tissue Based Product applied to wound bed, secured with steri-strips, cover with A health may change outter dressing only every other week, patient seen at wound center every other week Bathing/ Shower/ Hygiene May shower with protection but do not get wound dressing(s) wet. Edema Control - Lymphedema / SCD / Other Elevate legs to the level of the heart or above for 30 minutes daily and/or when sitting, a  frequency of: Avoid standing for long periods of time. Exercise regularly Home Health No change in wound care orders this week; continue Home Health for wound care. May utilize formulary equivalent dressing for wound treatment orders unless otherwise specified. Other Home Health Orders/Instructions: - [facility] Wound Treatment Wound #15 - Malleolus Wound Laterality: Left, Medial Prim Dressing: apligraf Other/Other:every other week ary Discharge Instructions: applied by MD : DO NOT REMOVE Secondary Dressing: Woven Gauze Sponge, Non-Sterile 4x4 in (Home Health) 1 x Per Week/15 Days Discharge Instructions: Apply over primary dressing as directed. Secondary Dressing: ABD Pad, 5x9 (Home Health) 1 x Per Week/15 Days Discharge Instructions: Apply over primary dressing as directed. Secondary Dressing: Drawtex 4x4 in [facility]) (Generic) 1 x Per Week/15 Days Discharge Instructions:  Apply over primary dressing as directed. Compression Wrap: Unnaboot w/Calamine, 4x10 (in/yd) (Home Health) 1 x Per Week/15 Days Discharge Instructions: Apply Unnaboot as directed. Electronic Signature(s) Signed: 05/22/2020 5:57:42 PM By: Linton Ham MD Signed: 05/23/2020 5:16:00 PM By: [name] RN Entered By: [name] on 05/22/2020 10:08:13 -------------------------------------------------------------------------------- Problem List Details Patient Name: Date of Service: [name], ELEA NO R [name]. 05/22/2020 9:15 A M Medical Record Number: [id number] Patient Account Number: [id number] Date of Birth/Sex: Treating RN: 1956-04-21 (64 y.o. [name], [name] Primary Care Provider: [name] Other Clinician: Referring Provider: Treating Provider/Extender: [name] in Treatment: 18 Active Problems ICD-10 Encounter Code Description Active Date MDM Diagnosis I87.332 Chronic venous hypertension (idiopathic) with ulcer and inflammation of left 01/17/2020 No Yes lower extremity L97.828 Non-pressure chronic ulcer of other part of left lower leg with other specified 01/17/2020 No Yes severity L97.328 Non-pressure chronic ulcer of left ankle with other specified severity 01/17/2020 No Yes Inactive Problems ICD-10 Code Description Active Date Inactive Date L03.116 Cellulitis of left lower limb 01/31/2020 01/31/2020 T81.31XD Disruption of external operation (surgical) wound, not elsewhere classified, subsequent 01/17/2020 01/17/2020 encounter Resolved Problems Electronic Signature(s) Signed: 05/22/2020 5:57:42 PM By: Linton Ham MD Entered By: Linton Ham on 05/22/2020 10:06:57 -------------------------------------------------------------------------------- Progress Note Details Patient Name: Date of Service: [name], ELEA NO R [name]. 05/22/2020 9:15 A M Medical Record Number: [id number] Patient Account Number: [id number] Date of  Birth/Sex: Treating RN: 08-Nov-1955 (64 y.o. [name] Primary Care Provider: [name] Other Clinician: Referring Provider: Treating Provider/Extender: [name] in Treatment: 18 Subjective History of Present Illness (HPI) the remaining wound is over the left medial ankle. Similar wound over the right medial ankle healed largely with use of Apligraf. Most recently we have been using Hydrofera Blue over this wound with considerable improvement. The patient has been extensively worked up in the past for her venous insufficiency and she is not a candidate for antireflux surgery although I have none of the details available currently. 08/24/14; considerable improvement today. About 50% of this wound areas now epithelialized. The base of the wound appears to be healthier granulation.as opposed to last week when she had deteriorated a considerable improvement 08/17/14; unfortunately the wound has regressed somewhat. The areas of epithelialization from the superior aspect are not nearly as healthy as they were last week. The patient thinks her Hydrofera Blue slipped. 09/07/14; unfortunately the area has markedly regressed in the 2 weeks since I've seen this. There is an odor surrounding erythema. The healthy granulation tissue that we had at the base of the wound now is a dusky color. The nurse reports green drainage 09/14/14; the area looks somewhat better than last week. There is less erythema and  less drainage. The culture I did did not show any growth. Nevertheless I think it is better to continue the Cipro and doxycycline for a further week. The remaining wound area was debridement. 09/21/14. Wound did not require debridement last week. Still less erythema and less drainage. She can complete her antibiotics. The areas of epithelialization in the superior aspect of the wound do not look as healthy as they did some weeks ago 10/05/14 continued improvement in the  condition of this wound. There is advancing epithelialization. Less aggressive debridement required 10/19/14 continued improvement in the condition and volume of this wound. Less aggressive debridement to the inferior part of this to remove surface slough and fibrinous eschar 11/02/14 no debridement is required. The surface granulation appears healthy although some of her islands of epithelialization seem to have regressed. No evidence of infection 11/16/14; lites surface debridement done of surface eschar. The wound does not look to be unhealthy. No evidence of infection. Unfortunately the patient has had podiatry issues in the right foot and for some reason has redeveloped small surface ulcerations in the medial right ankle. Her original presentation involved wounds in this area 11/23/14 no debridement. The area on the right ankle has enlarged. The left ankle wound appears stable in terms of the surface although there is periwound inflammation. There has been regression in the amount of new skin 11/30/14 no debridement. Both wound areas appear healthy. There was no evidence of infection. The the new area on the right medial ankle has enlarged although that both the surfaces appear to be stable. 12/07/14; Debridement of the right medial ankle wound. No no debridement was done on the left. 12/14/14 no major change in and now bilateral medial ankle wounds. Both of these are very painful but the no overt evidence of infection. She has had previous venous ablation 12/21/14; patient states that her right medial ankle wound is considerably more painful last week than usual. Her left is also somewhat painful. She could not tolerate debridement. The right medial ankle wound has fibrinous surface eschar 12/28/14 this is a patient with severe bilateral venous insufficiency ulcers. For a considerable period of time we actually had the one on the right medial ankle healed however this recently opened up again in June.  The left medial ankle wound has been a refractory area with some absent flows. We had some success with Hydrofera Blue on this area and it literally closed by 50% however it is recently opened up Foley. Both of these were debridement today of surface eschar. She tolerates this poorly 01/25/15: No change in the status of this. Thick adherent escar. Very poor tolerance of any attempt at debridement. I had healed the right medial malleolus wound for a considerable amount of time and had the left one down to about 50% of the volume although this is totally regressed over the last 48 weeks. Further the right leg has reopened. she is trying to make a appointment with pain and vascular, previous ablations with Dr. Aleda Grana. I do not believe there is an arterial insufficiency issue here 02/01/15 the status of the adherent eschar bilaterally is actually improved. No debridement was done. She did not manage to get vascular studies done 02/08/15 continued debridement of the area was done today. The slough is less adherent and comes off with less pressure. There is no surrounding infection peripheral pulses are intact 02/15/15 selective debridement with a disposable curette. Again the slough is less adherent and comes off with less difficulty. No surrounding  infection peripheral pulses are intact. 02/22/15 selective debridement of the right medial ankle wound. Slough comes off with less difficulty. No obvious surrounding infection peripheral pulses are intact I did not debridement the one on the left. Both of these are stable to improved 03/01/15 selective debridement of both wound areas using a curette to. Adherent slough cup soft with less difficulty. No obvious surrounding infection. The patient tells me that 2 days ago she noted a rash above the right leg wrap. She did not have this on her lower legs when she change this over she arrives with widespread left greater than right almost folliculitis-looking rash  which is extremely pruritic. I don't see anything to culture here. There is no rash on the rest of her body. She feels well systemically. 03/08/15; selective debridement of both wounds using a curette. Base of this does not look unhealthy. She had limegreen drainage coming out of the left leg wound and describes a lot of drainage. The rash on her left leg looks improved to. No cultures were done. 03/22/15; patient was not here last week. Basal wounds does not look healthy and there is no surrounding erythema. No drainage. There is still a rash on the left leg that almost looks vasculitic however it is clearly limited to the top of where the wrap would be. 04/05/15; on the right required a surgical debridement of surface eschar and necrotic subcutaneous tissue. I did not debridement the area on the left. These continue to be large open wounds that are not changing that much. We were successful at one point in healing the area on the right, and at the same time the area on the left was roughly half the size of current measurements. I think a lot of the deterioration has to do with the prolonged time the patient is on her feet at work 04/19/15 I attempted-like surface debridement bilaterally she does not tolerate this. She tells me that she was in allergic care yesterday with extreme pain over her left lateral malleolus/ankle and was told that she has an "sprain" 05/03/15; large bilateral venous insufficiency wounds over the medial malleolus/medial aspect of her ankles. She complains of copious amounts of drainage and his usual large amounts of pain. There is some increasing erythema around the wound on the right extending into the medial aspect of her foot to. historically she came in with these wounds the right one healed and the left one came down to roughly half its current size however the right one is reopened and the left is expanded. 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 05/10/15 large bilateral venous insufficiency wounds. There is less adherence surface left however the surface culture that I did last week grew pseudomonas therefore bilateral selective debridement score necessary. There is surrounding erythema. The patient describes severe bilateral drainage and a lot of pain in the left ankle. Apparently her podiatrist was were ready to do a cortisone shot 05/17/15; the patient complains of pain and again copious amounts of drainage. 05/24/15; we used Iodo flex last week. Patient notes considerable improvement in wound drainage. Only needed to change this once. 05/31/15; we continued Iodoflex; the base of these large wounds bilaterally is not too bad but there is probably likely a significant bioburden here. I would like to debridement just doesn't tolerate it. 06/06/14 I would like to continue the Iodoflex although she still hasn't managed to obtain supplies. She  has bilateral medial malleoli or large wounds which are mostly superficial. Both of them are covered circumferentially with some nonviable fibrinous slough although she tolerates debridement very poorly. She apparently has an appointment for an ablation on the right leg by interventional radiology. 06/14/15; the patient arrives with the wounds and static condition. We attempted a debridement although she does not do well with this secondary to pain. I 07/05/15; wounds are not much smaller however there appears to be a cleaner granulating base. The left has tight fibrinous slough greater than the right. Debridement is tolerated poorly due to pain. Iodoflex is done more for these wounds in any of the multitude of different dressings I have tried on the left 1 and then subsequently the right. 07/12/15; no change in the condition of this wound. I am able to do an aggressive debridement on the right but not the left. She simply cannot tolerate it. We have been using Iodoflex which helps  somewhat. It is worthwhile remembering that at one point we healed the right medial ankle wound and the left was about 25% of the current circumference. We have suggested returning to vascular surgery for review of possible further ablations for one reason or another she has not been able to do this. 07/26/15 no major change in the condition of either wound on her medial ankle. I did not attempt to debridement of these. She has been aggressively scrubbing these while she is in the shower at home. She has her supply of Iodoflex which seems to have done more for these wounds then anything I have put on recently. 08/09/15 wound area appears larger although not verified by measurements. Using Iodoflex 09/05/2015 -- she was here for avisit today but had significant problems with the wound and I was asked to see her for a physician opinion. I have summarize that this lady has had surgery on her left lower extremity about 10 years ago where the possible veins stripping was done. She has had an opinion from interventional radiology around November 2016 where no further sclerotherapy was ordered. The patient works 12 hours a day and stands on a concrete floor with work boots and is unable to get the proper compression she requires and cannot elevate her limbs appropriately at any given time. She has recently grown Pseudomonas from her wound culture but has not started her ciprofloxacin which was called in for her. 09/13/15 this continues to be a difficult situation for this patient. At one point I had this wound down to a 1.5 x 1.5" wound on her left leg. This is deteriorated and the right leg has reopened. She now has substantial wounds on her medial calcaneus, malleoli and into her lower leg. One on the left has surface eschar but these are far too painful for me to debridement here. She has a vascular surgery appointment next week to see if anything can be done to help here. I think she has had previous  ablations several years ago at [facility]. She has no major edema. She tells me that she did not get product last time [facility] and went for several days without it. She continues to work in work boots 12 hours a day. She cannot get compression/4-layer under her work boots. 09/20/15 no major change. Periwound edema control was not very good. Her point with pain and vascular is next Wednesday the 25th 09/28/15; the patient is seen vascular surgery and is apparently scheduled for repeat duplex ultrasounds of her bilateral lower  legs next week. 10/05/15; the patient was seen by Dr. Doren Custard of vascular surgery. He feels that she should have arterial insufficiency excluded as cause/contributed to her nonhealing stage she is therefore booked for an arteriogram. She has apparently monophasic signals in the dorsalis pedis pulses. She also of course has known severe chronic venous insufficiency with previous procedures as noted previously. I had another long discussion with the patient today about her continuing to work 12 hour shifts. I've written her out for 2 months area had concerns about this as her work location is currently undergoing significant turmoil and this may lead to her termination. She is aware of this however I agree with her that she simply cannot continue to stand for 12 hours multiple days a week with the substantial wound areas she has. 10/19/15; the Dr. Doren Custard appointment was largely for an arteriogram which was normal. She does not have an arterial issue. He didn't make a comment about her chronic venous insufficiency for which she has had previous ablations. Presumably it was not felt that anything additional could be done. The patient is now out of work as I prescribed 2 weeks ago. Her wounds look somewhat less aggravated presumably because of this. I felt I would give debridement another try today 10/25/15; no major change in this patient's wounds. We are struggling to get her product  that she can afford into her own home through her insurance. 11/01/15; no major change in the patient's wounds. I have been using silver alginate as the most affordable product. I spoke to Dr. Marla Roe last week with her requested take her to the OR for surgical debridement and placement of ACEL. Dr. Marla Roe told me that she would be willing to do this however [facility] will not cover this, fortunately the patient has [place] healthcare of some variant 11/08/15; no major change in the patient's wounds. She has been completely nonviable surface that this but is in too much pain with any attempted debridement are clinic. I have arranged for her to see Dr. Marla Roe ham of plastic surgery and this appointment is on Monday. I am hopeful that they will take her to the OR for debridement, possible ACEL ultimately possible skin graft 11/22/15 no major change in the patient's wounds over her bilateral medial calcaneus medial malleolus into the lower legs. Surface on these does not look too bad however on the left there is surrounding erythema and tenderness. This may be cellulitis or could him sleepy tinea. 11/29/15; no major changes in the patient's wounds over her bilateral medial malleolus. There is no infection here and I don't think any additional antibiotics are necessary. There is now plan to move forward. She sees Dr. Marla Roe in a week's time for preparation for operative debridement and ACEL placement I believe on 7/12. She then has a follow-up appointment with Dr. Marla Roe on 7/21 12/28/15; the patient returns today having been taken to the [facility] by Dr. Marla Roe 12/12/15 she underwent debridement, intraoperative cultures [which were negative]. She had placement of a wound VAC. Parent really ACEL was not available to be placed. The wound VAC foam apparently adhered to the wound since then she's been using silver alginate, Xeroform under Ace wraps. She still says there is a lot of  drainage and a lot of pain 01/31/16; this is a patient I see monthly. I had referred her to Dr. Marla Roe him of plastic surgery for large wounds on her bilateral medial ankles. She has been to the OR  twice once in early July and once in early August. She tells me over the last 3 weeks she has been using the wound VAC with ACEL underneath it. On the right we've simply been using silver alginate. Under Kerlix Coban wraps. 02/28/16; this is a patient I'm currently seeing monthly. She is gone on to have a skin graft over her large venous insufficiency ulcer on the left medial ankle. This was done by Dr. Marla Roe him. The patient is a bit perturbed about why she didn't have one on her right medial ankle wound. She has been using silver alginate to this. 03/06/16; I received a phone call from her plastic surgery Dr. Marla Roe. She expressed some concern about the viability of the skin graft she did on the left medial ankle wound. Asked me to place Endoform on this. She told me she is not planning to do a subsequent skin graft on the right as the left one did not take very well. I had placed Hydrofera Blue on the right 03/13/16; continue to have a reasonably healthy wound on the right medial ankle. Down to 3 mm in terms of size. There is epithelialization here. The area on the left medial ankle is her skin graft site. I suppose the last week this looks somewhat better. She has an open area inferiorly however in the center there appears to be some viable tissue. There is a lot of surface callus and eschar that will eventually need to come off however none of this looked to be infected. Patient states that the is able to keep the dressing on for several days which is an improvement. 03/20/16 no major change in the circumference of either wound however on the left side the patient was at Dr. Eusebio Friendly office and they did a debridement of left wound. 50% of the wound seems to be epithelialized. I been using  Endoform on the left Hydrofera Blue in the right 03/27/16; she arrives today with her wound is not looking as healthy as they did last week. The area on the right clearly has an adherent surface to this a very similar surface on the left. Unfortunately for this patient this is all too familiar problem. Clearly the Endoform is not working and will need to change that today that has some potential to help this surface. She does not tolerate debridement in this clinic very well. She is changing the dressing wants 04/03/16; patient arrives with the wounds looking somewhat better especially on the right. Dr. Migdalia Dk change the dressing to silver alginate when she saw her on Monday and also sold her some compression socks. The usefulness of the latter is really not clear and woman with severely draining wounds. 04/10/16; the patient is doing a bit of an experiment wearing the compression stockings that Dr. Migdalia Dk provided her to her left leg and the out of legs based dressings that we provided to the right. 05/01/16; the patient is continuing to wear compression stockings Dr. Migdalia Dk provided her on the left that are apparently silver impregnated. She has been using Iodoflex to the right leg wound. Still a moderate amount of drainage, when she leaves here the wraps only last for 4 days. She has to change the stocking on the left leg every night 05/15/16; she is now using compression stockings bilaterally provided by Dr. Marla Roe. She is wearing a nonadherent layer over the wounds so really I don't think there is anything specific being done to this now. She has some reduction on the  left wound. The right is stable. I think all healing here is being done without a specific dressing 06/09/16; patient arrives here today with not much change in the wound certainly in diameter to large circular wounds over the medial aspect of her ankle bilaterally. Under the light of these services are certainly not viable for  healing. There is no evidence of surrounding infection. She is wearing compression stockings with some sort of silver impregnation as prescribed by Dr. Marla Roe. She has a follow-up with her tomorrow. 06/30/16; no major change in the size or condition of her wounds. These are still probably covered with a nonviable surface. She is using only her purchase stockings. She did see Dr. Marla Roe who seemed to want to apply Dakin's solution to this I'm not extreme short what value this would be. I would suggest Iodoflex which she still has at home. 07/28/16; I follow Mrs. Keesey episodically along with Dr. Marla Roe. 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 She has been applying a topical collagen ointment to both wounds with Adaptic. I don't think Dr. Marla Roe is planning to take her back to the OR. 08/19/16; I follow Mrs. Jeneen Rinks on a monthly basis along with Dr. Marla Roe of plastic surgery. 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 I been following her for a number of years. At one point I was able to get the right medial malleolus wound to heal and had the left medial malleolus down to about half its current size however and I had to send her to plastic surgery for an operative debridement. Since then things have been stable to slightly improve the area on the right is slightly better one in the left about the same although there is much less adherent surface than I'm used to with this patient. She is using some form of liquid collagen gel that Dr. Marla Roe provided a Kerlix cover with the patient's own pressure stockings. She tells me that she has extreme pain in both ankles and along the lateral aspect of both feet. She has been unable to work for some period of time. She is telling me she is retiring at the beginning of April. She sees Dr. Doran Durand of orthopedics  next week 09/22/16; patient has not seen Dr. Marla Roe since the last time she is here. I'm not really sure what she is using to the wounds other than bits and pieces of think she had left over including most recently Hydrofera Blue. She is using juxtalite stockings. She is having difficulty with her husband's recent illness "stroke". She is having to transport him to various doctors appointments. Dr. Marla Roe left her the option of a repeat debridement with ACEL however she has not been able to get the time to follow-up on this. She continues to have a fair amount of drainage out of these wounds with certainly precludes leaving dressings on all week 10/13/16; patient has not seen Dr. Marla Roe since she was last in our clinic. I'm not really sure what she is doing with the wounds, we did try to get her [facility] and I think she is actually using this most of the time. Because of drainage she states she has to change this every second day although this is an improvement from what she used to do. She went to see Dr. Doran Durand who did not think she had a muscular issue with regards to her feet,  he referred her to a neurologist and I think the appointment is sometime in June. I changed her back to Iodoflex which she has used in the past but not recently. 11/03/16; the patient has been using Iodoflex although she ran out of this. Still claims that there is a lot of drainage although the wound does not look like this. No surrounding erythema. She has not been back to see Dr. Marla Roe 11/24/16; the patient has been using Iodoflex again but she ran out of it 2 or 3 days ago. There is no major change in the condition of either one of these wounds in fact they are larger and covered in a thick adherent surface slough/nonviable tissue especially on the left. She does not tolerate mechanical debridement in our clinic. Going back to see Dr. Marla Roe of plastic surgery for an operative debridement would seem  reasonable. 12/15/16; the patient has not been back to see Dr. Marla Roe. She is been dealing with a series of illnesses and her husband which of monopolized her time. She is been using Sorbact which we largely supplied. She states the drainage is bad enough that it maximum she can go 2-3 days without changing the dressing 01/12/2017 -- the patient has not been back for about 4 weeks and has not seen Dr. Marla Roe not does she have any appointment pending. 01/23/17; patient has not seen Dr. Marla Roe even though I suggested this previously. She is using Santyl that was suggested last week by Dr. Con Memos this Cost her $16 through her insurance which is indeed surprising 02/12/17; continuing Santyl and the patient is changing this daily. A lot of drainage. She has not been back to see plastic surgery she is using an Ace wrap. Our intake nurse suggested wrap around stockings which would make a good reasonable alternative 02/26/17; patient is been using Santyl and changing this daily due to drainage. She has not been to see plastic surgery she uses in April Ace wrap to control the edema. She did obtain extremitease stockings but stated that the edema in her leg was to big for these 03/20/17; patient is using Santyl and Anasept. Surfaces looked better today the area on the right is actually measuring a little smaller. She has states she has a lot of pain in her feet and ankles and is asking for a consult to pain control which I'll try to help her with through our case manager. 04/10/17; the patient arrives with better-looking wound surfaces and is slightly smaller wound on the left she is using a combination of Santyl and Anasept. She has an appointment or at least as started in the pain control center associated with [facility] 05/14/17; this is a patient who I followed for a prolonged period of time. 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 At one point I had this down to a  much smaller wound on the left however these reopened and we've never been able to get these to heal. She has been using Santyl and Anasept gel although 2 weeks ago she ran out of the Anasept gel. She has a stable appearance of the wound. She is going to the wound care clinic at [facility]. They wanted do a nerve block/spinal block although she tells me she is reluctant to go forward with that. 05/21/17; this is a patient I have followed for many years. 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 Chronic pain and deformity in her ankles as well. She is  been to see plastic surgery as well as orthopedics. Using PolyMem AG most recently/Kerramax/ABDs and 2 layer compression. She has managed to keep this on and she is coming in for a nurse check to change the dressing on Tuesdays, we see her on Fridays 06/05/17; really quite a good looking surface and the area especially on the right medial has contracted in terms of dimensions. Well granulated healthy-looking tissue on both sides. Even with an open curet there is nothing that even feels abnormal here. This is as good as I've seen this in quite some time. We have been using PolyMem AG and bringing her in for a nurse check 06/12/17; really quite good surface on both of these wounds. The right medial has contracted a bit left is not. We've been using PolyMem and AG and she is coming in for a nurse visit 06/19/17; we have been using PolyMem AG and bringing her in for a nurse check. Dimensions of her wounds are not better but the surfaces looked better bilaterally. She complained of bleeding last night and the left wound and increasing pain bilaterally. She states her wound pain is more neuropathic than just the wounds. There was some suggestion that this was radicular from her pain management doctor in talking to her it is really difficult to sort this out. 06/26/17; using PolyMem and AG and bringing her in for a nurse check as All  of this and reasonably stable condition. Certainly not improved. The dimensions on the lateral part of the right leg look better but not really measuring better. The medial aspect on the left is about the same. 07/03/16; we have been using PolyMen AG and bringing her in for a nurse check to change the dressings as the wounds have drainage which precludes once weekly changing. We are using all secondary absorptive dressings.our intake nurse is brought up the idea of using a wound VAC/snap VAC on the wound to help with the drainage to see if this would result in some contraction. This is not a bad idea. The area on the right medial is actually looking smaller. Both wounds have a reasonable-looking surface. There is no evidence of cellulitis. The edema is well controlled 07/10/17; the patient was denied for a snap VAC by her insurance. The major issue with these wounds continues to be drainage. We are using wicked PolyMem AG and she is coming in for a nurse visit to change this. The wounds are stable to slightly improved. The surface looks vibrant and the area on the right certainly has shrunk in size but very slowly 07/17/17; the patient still has large wounds on her bilateral medial malleoli. Surface of both of these wounds looks better. The dimensions seem to come and go but no consistent improvement. There is no epithelialization. We do not have options for advanced treatment products due to insurance issues. They did not approve of the wound VAC to help control the drainage. More recently we've been using PolyMem and AG wicked to allow drainage through. We have been bringing her in for a nurse visit to change this. We do not have a lot of options for wound care products and the home again due to insurance issues 07/24/17; the patient's wound actually looks somewhat better today. No drainage measurements are smaller still healthy-looking surface. We used silver collagen under PolyMen started last week. We  have been bringing her in for a dressing change 07/31/17; patient's wound surface continued to look better and I think there is visible  change in the dimensions of the wound on the right. Rims of epithelialization. We have been using silver collagen under PolyMen and bringing her in for a dressing change. There appears to be less drainage although she is still in need of the dressing change 08/07/17. Patient's wound surface continues to look better on both sides and the area on the right is definitely smaller. We have been using silver collagen and PolyMen. She feels that the drainage has been it has been better. I asked her about her vascular status. She went to see Dr. Aleda Grana at [facility] and had some form of ablation. I don't have much detail on this. I haven't my notes from 2016 that she was not a candidate for any further ablation but I don't have any more information on this. We had referred her to vein and vascular I don't think she ever went. He does not have a history of PAD although I don't have any information on this either. We don't even have ABIs in our record 08/14/17; we've been using silver collagen and PolyMen cover. And putting the patient and compression. She we are bringing her in as a nurse visit to change this because ofarge amount of drainage. We didn't the ABIs in clinic today since they had been done in many moons 1.2 bilaterally. She has been to see vein and vascular however this was at [facility] and she had ablation although I really don't have any information on this all seemed biking get a report. She is also been operatively debrided by plastic surgery and had a cell placed probably 8-12 months ago. This didn't have a major effect. We've been making some gains with current dressings 08/19/17-She is here in follow-up evaluation for bilateral medial malleoli ulcers. She continues to tolerate debridement very poorly. We will continue with recently changed topical  treatment; if no significant improvement may consider switching to Iodosorb/Iodoflex. 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 She has been using silver collagen and PolyMem. I believe she has been used and tried on Iodoflex before. 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 These have been very refractory over time. We have been using silver collagen and PolyMen. She is coming in once a week for a doctor's and once a week for nurse visits. We are actually making some progress 09/18/17; the patient's wounds are smaller especially on the right medial. She arrives today to upset to consider even washing these off with Anasept which I think is been part of the reason this is been closing. We've been using collagen covered in PolyMen otherwise. It is noted that she has a small area of folliculitis on the right medial calf that. As we are wrapping her legs I'll give her a short course of doxycycline to make sure this doesn't amount to anything. She is a long list of complaints today including imbalance, shortness of breath on exertion, inversion of her left ankle. With regards to the latter complaints she is been to see orthopedics and they offered her a tendon release surgery I believe but wanted her wounds to be closed first. I have recommended she go see her primary physician with regards to everything else. 09/25/17; patient's wounds are about the same size. We have made some progress bilaterally although  not in recent weeks. She will not allow me T wash these wounds with Anasept even if she is doing her cell. Wheeze we've been using collagen covered in PolyMen. Last week she had a small area of folliculitis this is now opened into a small wound. She completed 5  days of trimethoprim sulfamethoxazole 10/02/17; unfortunately the area on her left medial ankle is worse with a larger wound area towards the Achilles. The patient complains of a lot of pain. She will not allow debridement although visually I don't think there is anything to debridement in any case. We have been using silver collagen and PolyMen for several months now. Initially we are making some progress although I'm not really seeing that today. We will move back to [facility]. His admittedly this is a bit of a repeat however I'm hoping that his situation is different now. The patient tells me she had her leg on the left give out on her yesterday this is process some pain. 10/09/17; the patient is seen twice a week largely because of drainage issues coming out of the chronic medial bimalleolar wounds that are chronic. Last week the dimensions of the one on the left looks a little larger I changed her to [facility]. She comes in today with a history of terrible pain in the bilateral wound areas. She will not allow debridement. She will not even allow a tissue culture. There is no surrounding erythema no no evidence of cellulitis. We have been putting her Kerlix Coban man. She will not allow more aggressive compression as there was a suggestion to put her in 3 layer wraps. 10/16/17; large wounds on her bilateral medial malleoli. These are chronic. Not much change from last week. The surface looks have healthy but absolutely no epithelialization. A lot of pain little less so of drainage. She will not allow debridement or even washing these off in the vigorous fashion with Anasept. 10/23/17; large wounds on her bilateral malleoli which are chronic. Some improvement in terms of size perhaps on the right since last time I saw these. She states that after we increased the 3 layer compression there was some bleeding, when she came in for a nurse visit she did not want 3 layer compression put back on  about our nurse managed to convince her. She has known chronic venous visit issues and I'm hoping to get her to tolerate the 3 layer compression. using Hydrofera Blue 10/30/17; absolutely no change in the condition of either wound although we've had some improvement in dimensions on the right.. Attempted to put her in 3 layer compression she didn't tolerated she is back in 2 layer compression. We've been using Hydrofera Blue We looked over her past records. She had venous reflux studies in November 2016. There was no evidence of deep venous reflux on the right. Superficial vein did not show the greater saphenous vein at think this is been previously ablated the small saphenous vein was within normal limits. The left deep venous system showed no DVT the vessels were positive for deep venous reflux in the posterior tibial veins at the ankle. The greater saphenous vein was surgically absent small saphenous vein was within normal limits. She went to vein and vascular at [facility]. I believe she had an ablation on the left greater saphenous vein. I'll update her reflux studies perhaps ever reviewed by vein and vascular. We've made absolutely no progress in these wounds. Will also try to read and TheraSkins through her insurance  11/06/17; W the patient apparently has a 2 week follow-up with vein and vascular I like him to review the whole issue with regards to her previous vascular workup by Dr. Aleda Grana. We've really made no progress on these wounds in many months. She arrives today with less viable looking surface on the left medial ankle wound. This was apparently looking about the same on Tuesday when she was here for nurse visit. 11/13/17; deep tissue culture I did last time of the left lower leg showed multiple organisms without any predominating. In particular no Staphylococcus or group A strep were isolated. We sent her for venous reflux studies. She's had a previous left greater saphenous vein  stripping and I think sclerotherapy of the right greater saphenous vein. She didn't really look at the lesser saphenous vein this both wounds are on the medial aspect. She has reflux in the common femoral vein and popliteal vein and an accessory vein on the right and the common femoral vein and popliteal vein on the left. I'm going to have her go to see vein and vascular just the look over things and see if anything else beside aggressive compression is indicated here. We have not been able to make any progress on these wounds in spite of the fact that the surface of the wounds is never look too bad. 11/20/17; no major change in the condition of the wounds. Patient reports a large amount of drainage. She has a lot of complaints of pain although enlisting her today I wonder if some of this at least his neuropathic rather than secondary to her wounds. She has an appointment with vein and vascular on 12/30/17. The refractory nature of these wounds in my mind at least need vein and vascular to look over the wounds the recent reflux studies we did and her history to see if anything further can be done here. I also note her gait is deteriorated quite a bit. 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 She has a bilateral Trendelenburg gait. I wonder if this is neuropathic or perhaps multilevel radicular. 11/27/17; her wounds actually looks slightly better. Healthy-looking granulation tissue a scant amount of epithelialization. [place] healthcare will not pay for [employer]. They will play for tri layer Oasis and Dermagraft. This is not a diabetic ulcer. We'll try for the tri layer Oasis. She still complains of some drainage. She has a vein and vascular appointment on 12/30/17 12/04/17; the wounds visually look quite good. Healthy-looking granulation with some degree of epithelialization. We are still waiting for response to our request for trial to try layer Oasis. Her appointment with vascular to review  venous and arterial issues isn't sold the end of July 7/31. Not allow debridement or even vigorous cleansing of the wound surface. 12/18/17; slightly smaller especially on the right. 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 We've been using Hydrofera Blue. We still are looking into triple layer Oasis through her insurance 01/08/18 on evaluation today patient's wound actually appears to be showing signs of good improvement at this point in time. She has been tolerating the dressing changes without complication. Fortunately there does not appear to be any evidence of infection at this point in time. We have been utilizing silver nitrate which does seem to be of benefit for her which is also good news. Overall I'm very happy with how things seem to be both regards appearance as well as measurement. Patient did see Dr. Bridgett Larsson for evaluation on 12/30/17. In  his assessment he felt that stripping would not likely add much more than chronic compression to the patient's healing process. His recommendation was to follow-up in three months with Dr. Doren Custard if she hasn't healed in order to consider referral back to you and see vascular where she previously was in a trial and was able to get her wound to heal. I'll be see what she feels she when you staying compression and he reiterated this as well. 01/13/18 on evaluation today patient appears to actually be doing very well in regard to her bilateral medial malleolus ulcers. She seems to have tolerated the chemical cauterization with silver nitrate last week she did have some pain through that evening but fortunately states that I'll be see since it seems to be doing better she is overall pleased with the progress. 01/21/18; really quite a remarkable improvement since I've last seen these wounds. We started using silver nitrate specially on the islands of hyper granulation which for some reason her around the wound circumference. This is  really done quite nicely. Primary dressing Hydrofera Blue under 4 layer compression. She seems to be able to hold out without a nurse rewrap. Follow-up in 1 week 01/28/18; we've continued the hydrofera blue but continued with chemical cauterization to the wound area that we started about a month ago for irregular hyper granulation. She is made almost stunning improvement in the overall wound dimensions. I was not really expecting this degree of improvement in these chronic wounds 02/05/18; we continue with Hydrofera Bluebut of also continued the aggressive chemical cauterization with silver nitrate. We made nice progress with the right greater than left wound. 02/12/18. We continued with Hydrofera Blue after aggressive chemical cauterization with silver nitrate. We appear to be making nice progress with both wound areas 02/19/2018; we continue with [facility] after washing the wounds vigorously with Anasept spray and chemical cauterization with silver nitrate. We are making excellent progress. The area on the right's just about closed 02/26/2018. The area on the left medial ankle had too much necrotic debris today. I used a #5 curette we are able to get most of the soft. I continued with the silver nitrate to the much smaller wound on the right medial ankle she had a new area on her right lower pretibial area which she says was due to a role in her compression 03/05/2018; both wound areas look healthy. Not much change in dimensions from last week. I continue to use silver nitrate and Hydrofera Blue. The patient saw Dr. Doren Custard of vein and vascular. He felt she had venous stasis ulcers. He felt based on her previous arteriogram she should have adequate circulation for healing. Also she has deep venous reflux but really no significant correctable superficial venous reflux at this time. He felt we should continue with conservative management including leg elevation and compression 04/02/2018; since we last  saw this woman about a month ago she had a fall apparently suffered a pelvic fracture. I did not look up the x-ray. Nevertheless because of pain she literally was bedbound for 2 weeks and had home health coming out to change the dressing. Somewhat predictably this is resulted in considerable improvement in both wound areas. The right is just about closed on the medial malleolus and the left is about half the size. 04/16/2018; both her wounds continue to go down in size. Using Hydrofera Blue. 05/07/18; both her wounds appeared to be improving especially on the right where it is almost closed. We are using  Hydrofera Blue 05/14/2018; slightly worse this week with larger wounds. Surface on the left medial not quite as good. We have been using Hydrofera Blue 05/21/18; again the wounds are slightly larger. Left medial malleolus slightly larger with eschar around the circumference. We have been using Hydrofera Blue undergoing a wraps for a prolonged period of time. This got a lot better when she was more recumbent due to a fall and a back injury. I change the primary dressing the silver alginate today. She did not tolerate a 4 layer compression previously although I may need to bring this up with her next time 05/28/2018; area on the left medial malleolus again is slightly larger with more drainage. Area on the right is roughly unchanged. She has a small area of folliculitis on the right medial just on the lower calf. This does not look ominous. 06/03/2018 left medial malleolus slightly smaller in a better looking surface. We used silver nitrate on this last time with silver alginate. The area on the right appears slightly smaller 1/10; left medial malleolus slightly smaller. Small open area on the right. We used silver nitrate and silver alginate as of 2 weeks ago. We continue with the wound and compression. These got a lot better when she was off her feet 1/17; right medial malleolus wound is smaller. The  left may be slightly smaller. Both surfaces look somewhat better. 1/24; both wounds are slightly smaller. Using silver alginate under Unna boots 1/31; both wounds appear smaller in fact the area on the right medial is just about closed. Surface eschar. We have been using silver alginate under Unna boots. The patient is less active now spends let much less time on her feet and I think this is contributed to the general improvement in the wound condition 2/7; both wounds appear smaller. I was hopeful the right medial would be closed however there there is still the same small open area. Slight amount of surface eschar on the left the dimensions are smaller there is eschar but the wound edges appear to be free. We have been using silver alginate under Unna boot's 2/14; both wounds once again measure smaller. Circumferential eschar on the left medial. We have been using silver alginate under Unna boots with gradual improvement 2/21; the area on the right medial malleolus has healed. The area on the left is smaller. We have been using silver alginate and Unna boots. We can discharge wrapping the right leg she has 20/30 stockings at home she will need to protect the scar tissue in this area 2/28; the area on the right medial malleolus remains closed the patient has a compression stocking. The area on the left is smaller. We have been using silver alginate and Unna boots. 3/6 the area on the right medial ankle remains closed. Good edema control noted she is using her own compression stocking. The area on the left medial ankle is smaller. We have been managing this with silver alginate and Unna boots which we will continue today. 3/13; the area on the right medial ankle remains closed and I'm declaring it healed today. When necessary the left is about the same still a healthy-looking surface but no major change and wound area. No evidence of infection and using silver alginate under unna and generally making  considerable improvement 3/27 the area on the right medial ankle remains closed the area on the left is about the same as last week. Certainly not any worse we have been using silver alginate under an  Unna boot 4/3; the area on the right medial ankle remains closed per the patient. We did not look at this wound. The wound on the left medial ankle is about the same surface looks healthy we have been using silver alginate under an Unna boot 4/10; area on the right medial ankle remains closed per the patient. We did not look at this wound. The wound on the left medial ankle is slightly larger. The patient complains that the [facility] caused burning pain all week. She also told us that she was a lot more active this week. Changed her back to silver alginate 4/17; right medial ankle still closed per the patient. Left medial ankle is slightly larger. Using silver alginate. She did not tolerate Hydrofera Blue on this area 4/24; right medial ankle remains closed we have not look at this. The left medial ankle continues to get larger today by about a centimeter. We have been using silver alginate under Unna boots. She complains about 4 layer compression as an alternative. She has been up on her feet working on her garden 5/8; right medial ankle remains closed we did not look at this. The left medial ankle has increased in size about 100%. We have been using silver alginate under Unna boots. She noted increased pain this week and was not surprised that the wound is deteriorated 5/15; no major change in SA however much less erythema ( one week of doxy ocellulitis). 5/22-64 year old female returns at 1 week to the clinic for left medial ankle wound for which we have been using silver alginate under 3 layer compression She was placed on DOXY at last visit - the wound is wider at this visit. She is in 3 layer compression 5/29; change to [facility] last week. I had given her empiric doxycycline 2 weeks ago  for a week. She is in 3 layer compression. She complains of a lot of pain and drainage on presentation today. 6/5; using Hydrofera Blue. I gave her doxycycline recently empirically for erythema and pain around the wound. Believe her cultures showed enterococcus which not would not have been well covered by doxycycline nevertheless the wound looks better and I don't feel specifically that the enterococcus needs to be covered. She has a new what looks like a wrap injury on her lateral left ankle. 6/12; she is using Hydrofera Blue. She has a new area on the left anterior lower tibial area. This was a wrap injury last week. 6/19; the patient is using Hydrofera Blue. She arrived with marked inflammation and erythema around the wound and tenderness. 12/01/18 on evaluation today patient appears to be doing a little bit better based on what I'm hearing from the standpoint of lassos evaluation to this as far as the overall appearance of the wound is concerned. Then sometime substandard she typically sees Dr. Dellia Nims. Nonetheless overall very pleased with the progress that she's made up to this point. No fevers, chills, nausea, or vomiting noted at this time. 7/10; some improvement in the surface area. Aggressively debrided last week apparently. I went ahead with the debridement today although the patient does not tolerate this very well. We have been using Iodoflex. Still a fair amount of drainage 7/17; slightly smaller. Using Iodoflex. 7/24; no change from last week in terms of surface area. We have been using Iodoflex. Surface looks and continues to look somewhat better 7/31; surface area slightly smaller better looking surface. We have been using Iodoflex. This is under Unna boot compression 8/7-Patient  presents at 1 week with Unna boot and Iodoflex, wound appears better 8/14-Patient presents at 1 week with Iodoflex, we use the Unna boot, wound appears to be stable better.Patient is getting Botox treatment  for the inversion of the foot for tendon release, Next week 8/21; we are using Iodoflex. Unna boot. The wound is stable in terms of surface area. Under illumination there is some areas of the wound that appear to be either epithelialized or perhaps this is adherent slough at this point I was not really clear. It did not wipe off and I was reluctant to debride this today. 8/28; we are using Iodoflex in an Unna boot. Seems to be making good improvement. 9/4; using Iodoflex and wound is slightly smaller. 9/18; we are using Iodoflex with topical silver nitrate when she is here. The wound continues to be smaller 10/2; patient missed her appointment last week due to GI issues. She left and Iodoflex based dressing on for 2 weeks. Wound is about the same size about the size of a dime on the left medial lower 10/9 we have been using Iodoflex on the medial left ankle wound. She has a new superficial probable wrap injury on the dorsal left ankle 10/16; we have been using Hydrofera Blue since last week. This is on the left medial ankle 10/23; we have been using Hydrofera Blue since 2 weeks ago. This is on the left medial ankle. Dimensions are better 11/6; using Hydrofera Blue. I think the wound is smaller but still not closed. Left medial ankle 11/13; we have been using Hydrofera Blue. Wound is certainly no smaller this week. Also the surface not as good. This is the remanent of a very large area on her left medial ankle. 11/20; using Sorbact since last week. Wound was about the same in terms of size although I was disappointed about the surface debris 12/11; 3-week follow-up. Patient was on vacation. Wound is measuring slightly larger we have been using Sorbact. 12/18; wound is about the same size however surface looks better last week after debridement. We have been using Sorbact under compression 1/15 wound is probably twice the size of last time increased in length nonviable surface. We have been using  Sorbact. She was running a mild fever and missed her appointment last week 1/22; the wound is come down in size but under illumination still a very adherent debris we have been Hydrofera Blue that I changed her to last week 1/29; dimensions down slightly. We have been using Hydrofera Blue 2/19 dimensions are the same however there is rims of epithelialization under illumination. Therefore more the surface area may be epithelialized 2/26; the patient's wound actually measures smaller. The wound looks healthy. We have been using Hydrofera Blue. I had some thoughts about running Apligraf then I still may do that however this looks so much better this week we will delay that for now 3/5; the wound is small but about the same as last week. We have been using Hydrofera Blue. No debridement is required today. 3/19; the wound is about the size of a dime. Healthy looking wound even under illumination. We have been using Hydrofera Blue. No mechanical debridement is necessary 3/26; not much change from last week although still looks very healthy. We have been using Hydrofera Blue under Unna boots Patient was offered an ankle fusion by podiatry but not until the wound heals with a proceed with this. 4/9; the patient comes in today with her original wound on the medial ankle  looking satisfactory however she has some uncontrolled swelling in the middle part of her leg with 2 new open areas superiorly just lateral to the tibia. I think this was probably a wrap issue. She said she felt uncomfortable during the week but did not call in. We have been using Hydrofera Blue 4/16; the wound on the medial ankle is about the same. She has innumerable small areas superior to this across her mid tibia. I think this is probably folliculitis. She is also been working in the yard doing a lot of sweating 4/30; the patient issue on the upper areas across her mid tibia of all healed. I think this was excessive yard work if I  remember. Her wound on the medial ankle is smaller. Some debris on this we have been using Hydrofera Blue under Unna boots 5/7; mid tibia. She has been using Hydrofera Blue under an Unna wrap. She is apparently going for her ankle surgery on June 3 10/28/19-Patient returns to clinic with the ankle wound, we are using Hydrofera Blue under Unna wrap, surgery is scheduled for her left foot for June 3 so she will be back for nurse visit next week READMISSION 01/17/2020 Mrs. Jewell is a 64 year old woman we have had in this clinic for a long period of time with severe venous hypertension and refractory wounds on her medial lower legs and ankles bilaterally. 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 When she was off her legs for a prolonged period example when she fell and suffered a compression fracture things would heal up quite nicely. She is now retired and we managed to heal up the right medial leg wound. The left one was very tiny last time I saw this although still refractory. 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 It got to the point where this was interfering with her walking and she elected to proceed with a ankle arthrodesis to straighten her her ankle and leave her with a functional outcome for mobilization. The patient was referred to Dr. Doren Custard and really this took some time to arrange. Dr. Doren Custard saw her on 12/07/2019. Once again he verified that she had no arterial issues. She had previously had an angiogram several years ago. Follow-up ABIs on the left showed an ABI of 1.12 with triphasic waveforms and a TBI of 0.92. She is felt to have chronic deep venous insufficiency but I do not think it was felt that anything could be done from about this from an ablation point of view. At  the time Dr. Doren Custard saw this patient the wounds actually look closed via the pictures in his clinic. The patient finally underwent her surgery on 12/15/2019. This went reasonably well and there was a good anatomic outcome. She developed a small distal wound dehiscence on the lateral part of the surgical wound. However more problematically she is developed recurrence of the wound on the medial left ankle. There are actually 2 wounds here one in the distal lower leg and 1 pretty much at the level of the medial malleolus. It is a more distal area that is more problematic. She has been using Hydrofera Blue which started on Friday before this she was simply Ace wrapping. There was a culture done that showed Pseudomonas and she is on ciprofloxacin. A  recent CNS on 8/11 was negative. The patient reports some pain but I generally think this is improving. She is using a cam boot completely nonweightbearing using a walker for pivot transfers and a wheelchair 8/24; not much improvement unfortunately she has a surgical wound on the lateral part in the venous insufficiency wound medially. The bottom part of the medial insufficiency wound is still necrotic there is exposed tendon here. We have been using Hydrofera Blue under compression. Her edema control is however better 8/31; patient in for follow-up of his surgical wound on the lateral part of her left leg and chronic venous insufficiency ulcers medially. We put her back in compression last week. She comes in today with a complaint of 3 or 4 days worth of increasing pain. She felt her cam walker was rubbing on the area on the back of her heel. However there is intense erythema seems more likely she has cellulitis. She had 2 cultures done when she was seeing podiatry in the postop. One of them in late July showed Pseudomonas and she received a course of ciprofloxacin the other was negative on 8/11 she is allergic to penicillin with anaphylactoid complaints of hives  oral swelling via information in epic 9/9; when I saw this patient last week she had intense anterior erythema around her wound on the right lateral heel and ankle and also into the right medial heel. Some of this was no doubt drainage and her walker boot however I was convinced she had cellulitis. I gave her Levaquin and Bactrim she is finishing up on this now. She is following up with Dr. [name] he saw her yesterday. He is taken her out of the walking boot of course she is still nonweightbearing. Her x-ray was negative for any worrisome features such as soft tissue air etc. Things are a lot better this week. She has home health. We have been using Hydrofera Blue under an [employer] which she put back on yesterday. I did not wrap her last week 9/17; her surrounding skin looks a lot better. In fact the area on the left lateral ankle has just a scant amount of eschar. The only remaining wound is the large area on the left medial ankle. Probably about 60% of this is healthy granulation at the surface however she has a significant divot distally. This has adherent debris in it. I been using debridement and silver collagen to try and get this area to fill-in although I do not think we have made much progress this week 9/24; the patient's wound on the left medial ankle looks a lot better. The deeper divot area distally still requires debridement but this is cleaning up quite nicely we have been using silver collagen. The patient is complaining of swelling in her foot and is worried that that is contributing to the nonhealing of the ankle wound. She is also complaining of numbness in her anterior toes 10/4; left medial ankle. The small area distally still has a divot with necrotic material that I have been debriding away. This has an undermining area. She is approved for Apligraf. She saw Dr. [name] her surgeon on 10/1. I think he declared himself is satisfied with the condition of things. Still  nonweightbearing till the end of the month. 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 Her surgical wound is well closed. There is no evidence of infection 10/11; the patient arrived in clinic today with the expectation that we be able to put  an Apligraf on this area after debridement however she arrives with a relatively large amount of green drainage on the dressing. The patient states that this started on Friday. She has not been systemically unwell. 10/19; culture I did last week showed both Enterococcus and Pseudomonas. I think this came in separate parts because I stopped her ciprofloxacin I gave her and prescribed her linezolid however now looking at the final culture result this is Pseudomonas which is resistant to quinolones. She has not yet picked up the linezolid apparently phone issues. We are also trying to get a topical antibiotic out of [facility] in [place] they can be applied by home health. She is still having green drainage 10/16; the patient has her topical antibiotic from [facility] in [place]. This is a compounded gel with vancomycin and ciprofloxacin and gentamicin. We are applying this on the wound bed with silver alginate over the top with Unna boot wraps. She arrives in clinic today with a lot less ominous looking drainage although she is only use this topical preparation once the second time today. She sees Dr. [name] her surgeon on Friday she has home health changing the dressing 11/2; still using her compounded topical antibiotic under silver alginate. Surface is cleaning up there is less drainage. We had an Apligraf for her today and I elected to apply it. A light coating of her antibiotic 04/25/2020 upon evaluation today patient appears to be doing well with regard to her ankle ulcer. There is a little bit of slough noted on the surface of the wound I am can have to perform sharp debridement to clear this away today. With that  being said other than that fact overall I feel like she is making progress and we do see some new epithelial growth. There is also some improvement in the depth of the wound and that distal portion. There is little bit of slough there as well. 12/7; 2-week follow-up. Apligraf #3. Dimensions are smaller. Closing in especially inferiorly. Still some surface debris. Still using the [facility] topical antibiotic but I told her that I don't think this needs to be renewed 12/21; 2-week follow-up. Apligraf #4 dimensions are smaller. Nice improvement Objective Constitutional Sitting or standing Blood Pressure is within target range for patient.. Pulse regular and within target range for patient.Marland Kitchen Respirations regular, non-labored and within target range.. Temperature is normal and within the target range for the patient.Marland Kitchen Appears in no distress. Vitals Time Taken: 9:19 AM, Height: 68 in, Temperature: 97.7 F, Pulse: 66 bpm, Respiratory Rate: 16 breaths/min, Blood Pressure: 103/66 mmHg. Cardiovascular Needle pulses palpable. IMA control is good.. General Notes: Wound exam; dimensions are smaller epithelializing inferiorly. Surface of the wound under illumination looks healthy I applied Apligraf #4 in the standard fashion. Integumentary (Hair, Skin) Wound #15 status is Open. Original cause of wound was Gradually Appeared. The wound is located on the Left,Medial Malleolus. The wound measures 2.6cm length x 1cm width x 0.1cm depth; 2.042cm^2 area and 0.204cm^3 volume. There is Fat Layer (Subcutaneous Tissue) exposed. There is no tunneling or undermining noted. There is a medium amount of serosanguineous drainage noted. The wound margin is distinct with the outline attached to the wound base. There is large (67-100%) pink granulation within the wound bed. There is a small (1-33%) amount of necrotic tissue within the wound bed including Adherent Slough. Assessment Active Problems ICD-10 Chronic venous  hypertension (idiopathic) with ulcer and inflammation of left lower extremity Non-pressure chronic ulcer of other part of left lower  leg with other specified severity Non-pressure chronic ulcer of left ankle with other specified severity Procedures Wound #15 Pre-procedure diagnosis of Wound #15 is a Venous Leg Ulcer located on the Left,Medial Malleolus. A skin graft procedure using a bioengineered skin substitute/cellular or tissue based product was performed by [name]., MD with the following instrument(s): Forceps and Scissors. Apligraf was applied and secured with Steri-Strips. 22 sq cm of product was utilized and 22 sq cm was wasted due to wound size. Post Application, adaptic was applied. A Time Out was conducted at 10:00, prior to the start of the procedure. The procedure was tolerated well with a pain level of 0 throughout and a pain level of 0 following the procedure. Post procedure Diagnosis Wound #15: Same as Pre-Procedure . Pre-procedure diagnosis of Wound #15 is a Venous Leg Ulcer located on the Left,Medial Malleolus . There was a Haematologist Compression Therapy Procedure by [name], RN. Post procedure Diagnosis Wound #15: Same as Pre-Procedure Plan Follow-up Appointments: Return Appointment in 2 weeks. - on Tuesday's Cellular or Tissue Based Products: Cellular or Tissue Based Product Type: - apli graft #4 Cellular or Tissue Based Product applied to wound bed, secured with steri-strips, cover with Adaptic or Mepitel. (DO NOT REMOVE). - home health may change outter dressing only every other week, patient seen at wound center every other week Bathing/ Shower/ Hygiene: May shower with protection but do not get wound dressing(s) wet. Edema Control - Lymphedema / SCD / Other: Elevate legs to the level of the heart or above for 30 minutes daily and/or when sitting, a frequency of: Avoid standing for long periods of time. Exercise regularly Home Health: No change  in wound care orders this week; continue Home Health for wound care. May utilize formulary equivalent dressing for wound treatment orders unless otherwise specified. Other Home Health Orders/Instructions: - AMEDYSIS WOUND #15: - Malleolus Wound Laterality: Left, Medial Prim Dressing: apligraf Other:/Other:every other week ary Discharge Instructions: applied by MD : DO NOT REMOVE Secondary Dressing: Woven Gauze Sponge, Non-Sterile 4x4 in (Home Health) 1 x Per Week/15 Days Discharge Instructions: Apply over primary dressing as directed. Secondary Dressing: ABD Pad, 5x9 (Home Health) 1 x Per Week/15 Days Discharge Instructions: Apply over primary dressing as directed. Secondary Dressing: Drawtex 4x4 in [facility]) (Generic) 1 x Per Week/15 Days Discharge Instructions: Apply over primary dressing as directed. Com pression Wrap: Unnaboot w/Calamine, 4x10 (in/yd) (Home Health) 1 x Per Week/15 Days Discharge Instructions: Apply Unnaboot as directed. 1. Apligraf #4 2. The patient appears to be doing well. We will see if she is eligible for another Apligraf in the new year Electronic Signature(s) Signed: 05/22/2020 5:57:42 PM By: Linton Ham MD Entered By: Linton Ham on 05/22/2020 10:10:33 -------------------------------------------------------------------------------- SuperBill Details Patient Name: Date of Service: [name], [name] [name]. 05/22/2020 Medical Record Number: [id number] Patient Account Number: [id number] Date of Birth/Sex: Treating RN: November 26, 1955 (64 y.o. [name], [name] Primary Care Provider: [name] Other Clinician: Referring Provider: Treating Provider/Extender: [name] in Treatment: 18 Diagnosis Coding ICD-10 Codes Code Description [phone] Chronic venous hypertension (idiopathic) with ulcer and inflammation of left lower extremity L97.828 Non-pressure chronic ulcer of other part of left lower leg with other specified  severity L97.328 Non-pressure chronic ulcer of left ankle with other specified severity Facility Procedures CPT4 Code: [id number] Description: (Facility Use Only) Apligraf 1 SQ CM Modifier: Quantity: [place] Code: [id number] Description: 26712 - SKIN SUB GRAFT TRNK/ARM/LEG ICD-10 Diagnosis Description I87.332 Chronic  venous hypertension (idiopathic) with ulcer and inflammation of left lo Modifier: wer extremity Quantity: 1 Physician Procedures : CPT4 Code Description Modifier [id number] - WC PHYS SKIN SUB GRAFT TRNK/ARM/LEG ICD-10 Diagnosis Description I87.332 Chronic venous hypertension (idiopathic) with ulcer and inflammation of left lower extremity Quantity: 1 Electronic Signature(s) Signed: 05/22/2020 5:57:42 PM By: Linton Ham MD Signed: 05/23/2020 5:16:00 PM By: [name] RN Entered By: [name] on 05/22/2020 10:11:30

## 2020-06-05 ENCOUNTER — Encounter (HOSPITAL_BASED_OUTPATIENT_CLINIC_OR_DEPARTMENT_OTHER): Payer: Medicare Other | Attending: Internal Medicine | Admitting: Internal Medicine

## 2020-06-05 ENCOUNTER — Other Ambulatory Visit: Payer: Self-pay

## 2020-06-05 DIAGNOSIS — L97328 Non-pressure chronic ulcer of left ankle with other specified severity: Secondary | ICD-10-CM | POA: Insufficient documentation

## 2020-06-05 DIAGNOSIS — L97828 Non-pressure chronic ulcer of other part of left lower leg with other specified severity: Secondary | ICD-10-CM | POA: Insufficient documentation

## 2020-06-05 DIAGNOSIS — L97329 Non-pressure chronic ulcer of left ankle with unspecified severity: Secondary | ICD-10-CM | POA: Diagnosis present

## 2020-06-05 DIAGNOSIS — I87332 Chronic venous hypertension (idiopathic) with ulcer and inflammation of left lower extremity: Secondary | ICD-10-CM | POA: Diagnosis not present

## 2020-06-05 NOTE — Progress Notes (Addendum)
JARYIAH, MEHLMAN ([id number]) Visit Report for 06/05/2020 Arrival Information Details Patient Name: Date of Service: [place] [name], [name] 06/05/2020 9:45 A M Medical Record Number: [id number] Patient Account Number: [id number] Date of Birth/Sex: Treating RN: [birth year] (65 y.o. [name], [name] Primary Care Kreed Kauffman: [name] Other Clinician: Referring Elzy Tomasello: Treating Dorethea Strubel/Extender: [name] in Treatment: 20 Visit Information History Since Last Visit Added or deleted any medications: No Patient Arrived: [name] Any new allergies or adverse reactions: No Arrival Time: 09:58 Had a fall or experienced change in No Accompanied By: self activities of daily living that may affect Transfer Assistance: None risk of falls: Patient Identification Verified: Yes Signs or symptoms of abuse/neglect since last visito No Secondary Verification Process Completed: Yes Hospitalized since last visit: No Patient Requires Transmission-Based Precautions: No Implantable device outside of the clinic excluding No Patient Has Alerts: Yes cellular tissue based products placed in the center Patient Alerts: L ABI =1.12, TBI = .92 since last visit: R ABI= 1.02, TBI= .58 Has Dressing in Place as Prescribed: Yes Pain Present Now: Yes Electronic Signature(s) Signed: 06/05/2020 11:46:23 AM By: [name] Entered By: [name] on 06/05/2020 09:58:35 -------------------------------------------------------------------------------- Complex / Palliative Patient Assessment Details Patient Name: Date of Service: [name], [name] 06/05/2020 9:45 A M Medical Record Number: [id number] Patient Account Number: [id number] Date of Birth/Sex: Treating RN: 01/03/1956 (65 y.o. [name] Primary Care Careli Luzader: [name] Other Clinician: Referring Shadi Larner: Treating Charna Neeb/Extender: [name] in Treatment: 20 Palliative Management  Criteria Complex Wound Management Criteria Patient has remarkable or complex co-morbidities requiring medications or treatments that extend wound healing times. Examples: Diabetes mellitus with chronic renal failure or end stage renal disease requiring dialysis Advanced or poorly controlled rheumatoid arthritis Diabetes mellitus and end stage chronic obstructive pulmonary disease Active cancer with current chemo- or radiation therapy PVD, Dystonia of foot/ankle Care Approach Wound Care Plan: Complex Wound Management Electronic Signature(s) Signed: 06/08/2020 4:48:18 PM By: Linton Ham MD Signed: 06/11/2020 5:04:19 PM By: Levan Hurst RN, BSN Entered By: Levan Hurst on 06/08/2020 13:55:40 -------------------------------------------------------------------------------- Compression Therapy Details Patient Name: Date of Service: [name], [name] 06/05/2020 9:45 A M Medical Record Number: [id number] Patient Account Number: [id number] Date of Birth/Sex: Treating RN: June 21, 1955 (65 y.o. [name] Primary Care Idabelle Mcpeters: [name] Other Clinician: Referring Rowland Ericsson: Treating Mayline Dragon/Extender: [name] in Treatment: 20 Compression Therapy Performed for Wound Assessment: Wound #15 Left,Medial Malleolus Performed By: Clinician [name], RN Compression Type: Rolena Infante Post Procedure Diagnosis Same as Pre-procedure Electronic Signature(s) Signed: 06/05/2020 5:38:12 PM By: [name] RN Entered By: [name] on 06/05/2020 10:46:42 -------------------------------------------------------------------------------- Encounter Discharge Information Details Patient Name: Date of Service: [name], [name] 06/05/2020 9:45 A M Medical Record Number: [id number] Patient Account Number: [id number] Date of Birth/Sex: Treating RN: 19-Feb-1956 (65 y.o. [name] Cynia Abruzzo: [name] Other  Clinician: Referring Taccara Bushnell: Treating Katalia Choma/Extender: [name] in Treatment: 20 Encounter Discharge Information Items Discharge Condition: Stable Ambulatory Status: Cane Discharge Destination: Home Transportation: Private Auto Accompanied By: self Schedule Follow-up Appointment: Yes Clinical Summary of Care: Patient Declined Electronic Signature(s) Signed: 06/05/2020 5:39:46 PM By: Baruch Gouty RN, BSN Entered By: Baruch Gouty on 06/05/2020 11:03:40 -------------------------------------------------------------------------------- Lower Extremity Assessment Details Patient Name: Date of Service: [facility] [name], [name] 06/05/2020 9:45 A M Medical Record Number: [id number] Patient Account Number: [id number] Date of Birth/Sex: Treating  RN: [birth year] (65 y.o. [name] Yiannis Tulloch: [name] Other Clinician: Referring Renlee Floor: Treating Everli Rother/Extender: [name] in Treatment: 20 Edema Assessment Assessed: [Left: No] [Right: No] Edema: [Left: N] [Right: o] Calf Left: Right: Point of Measurement: From Medial Instep 27 cm Ankle Left: Right: Point of Measurement: From Medial Instep 21.5 cm Vascular Assessment Pulses: Dorsalis Pedis Palpable: [Left:Yes] Electronic Signature(s) Signed: 06/05/2020 5:39:46 PM By: Baruch Gouty RN, BSN Entered By: Baruch Gouty on 06/05/2020 10:16:35 -------------------------------------------------------------------------------- Multi Wound Chart Details Patient Name: Date of Service: [name], [name] 06/05/2020 9:45 A M Medical Record Number: [id number] Patient Account Number: [id number] Date of Birth/Sex: Treating RN: 07/23/1955 (65 y.o. [name], [name] Primary Care Ariba Lehnen: [name] Other Clinician: Referring Julianny Milstein: Treating Quetzally Callas/Extender: [name] in Treatment: 20 Vital Signs Height(in):  68 Pulse(bpm): 73 Weight(lbs): Blood Pressure(mmHg): 122/67 Body Mass Index(BMI): Temperature(F): 97.9 Respiratory Rate(breaths/min): 16 Photos: [15:No Photos Left, Medial Malleolus] [N/A:N/A N/A] Wound Location: [15:Gradually Appeared] [N/A:N/A] Wounding Event: [15:Venous Leg Ulcer] [N/A:N/A] Primary Etiology: [15:Peripheral Venous Disease,] [N/A:N/A] Comorbid History: [15:Osteoarthritis, Confinement Anxiety 12/30/2019] [N/A:N/A] Date Acquired: [15:20] [N/A:N/A] Weeks of Treatment: [15:Open] [N/A:N/A] Wound Status: [15:1.8x1x0.1] [N/A:N/A] Measurements L x W x D (cm) [15:1.414] [N/A:N/A] A (cm) : rea [15:0.141] [N/A:N/A] Volume (cm) : [15:87.90%] [N/A:N/A] % Reduction in Area: [15:98.50%] [N/A:N/A] % Reduction in Volume: [15:Full Thickness With Exposed Support N/A] Classification: [15:Structures Medium] [N/A:N/A] Exudate Amount: [15:Serous] [N/A:N/A] Exudate Type: [15:amber] [N/A:N/A] Exudate Color: [15:Flat and Intact] [N/A:N/A] Wound Margin: [15:Large (67-100%)] [N/A:N/A] Granulation Amount: [15:Red] [N/A:N/A] Granulation Quality: [15:None Present (0%)] [N/A:N/A] Necrotic Amount: [15:Fat Layer (Subcutaneous Tissue): Yes N/A] Exposed Structures: [15:Fascia: No Tendon: No Muscle: No Joint: No Bone: No Small (1-33%)] [N/A:N/A] Epithelialization: [15:Compression Therapy] [N/A:N/A] Treatment Notes Electronic Signature(s) Signed: 06/05/2020 4:50:25 PM By: Linton Ham MD Signed: 06/05/2020 5:38:12 PM By: [name] RN Entered By: Linton Ham on 06/05/2020 11:02:19 -------------------------------------------------------------------------------- Multi-Disciplinary Care Plan Details Patient Name: Date of Service: [facility] [name], [name] 06/05/2020 9:45 A M Medical Record Number: [id number] Patient Account Number: [id number] Date of Birth/Sex: Treating RN: 08/06/1955 (65 y.o. [name], [name] Primary Care Decari Duggar: [name] Other Clinician: Referring  Kalysta Kneisley: Treating Juanmanuel Marohl/Extender: [name] in Treatment: 20 Active Inactive Wound/Skin Impairment Nursing Diagnoses: Impaired tissue integrity Knowledge deficit related to ulceration/compromised skin integrity Goals: Patient/caregiver will verbalize understanding of skin care regimen Date Initiated: 01/17/2020 Target Resolution Date: 06/07/2020 Goal Status: Active Ulcer/skin breakdown will have a volume reduction of 30% by week 4 Date Initiated: 01/17/2020 Date Inactivated: 03/12/2020 Target Resolution Date: 03/09/2020 Goal Status: Met Interventions: Assess patient/caregiver ability to obtain necessary supplies Assess patient/caregiver ability to perform ulcer/skin care regimen upon admission and as needed Assess ulceration(s) every visit Provide education on ulcer and skin care Notes: Electronic Signature(s) Signed: 06/05/2020 5:38:12 PM By: [name] RN Entered By: [name] on 06/05/2020 10:45:47 -------------------------------------------------------------------------------- Pain Assessment Details Patient Name: Date of Service: [name], [name] 06/05/2020 9:45 A M Medical Record Number: [id number] Patient Account Number: [id number] Date of Birth/Sex: Treating RN: [birth year] (65 y.o. [name] Primary Care Cyla Haluska: [name] Other Clinician: Referring Ulysse Siemen: Treating Vernessa Likes/Extender: [name] in Treatment: 20 Active Problems Location of Pain Severity and Description of Pain Patient Has Paino Yes Site Locations Rate the pain. Current Pain Level: 4 Pain Management and Medication Current Pain Management: Electronic Signature(s) Signed: 06/05/2020 11:46:23 AM By: [name] Signed: 06/05/2020 5:38:12 PM By:  [name] RN Entered By: [name] on 06/05/2020  09:59:03 -------------------------------------------------------------------------------- Patient/Caregiver Education Details Patient Name: Date of Service: [facility] [name], [name] 1/4/2022andnbsp9:45 A M Medical Record Number: [id number] Patient Account Number: [id number] Date of Birth/Gender: Treating RN: 1956-01-20 (65 y.o. [name] Primary Care Physician: [name] Other Clinician: Referring Physician: Treating Physician/Extender: [name] in Treatment: 20 Education Assessment Education Provided To: Patient Education Topics Provided Wound/Skin Impairment: Methods: Explain/Verbal Responses: State content correctly Electronic Signature(s) Signed: 06/05/2020 5:38:12 PM By: [name] RN Entered By: [name] on 06/05/2020 10:46:11 -------------------------------------------------------------------------------- Wound Assessment Details Patient Name: Date of Service: [name], [name] 06/05/2020 9:45 A M Medical Record Number: [id number] Patient Account Number: [id number] Date of Birth/Sex: Treating RN: May 17, 1956 (65 y.o. [name], [name] Primary Care Barnell Shieh: [name] Other Clinician: Referring Lakeith Careaga: Treating Karmina Zufall/Extender: [name] in Treatment: 20 Wound Status Wound Number: 15 Primary Venous Leg Ulcer Etiology: Wound Location: Left, Medial Malleolus Wound Status: Open Wounding Event: Gradually Appeared Comorbid Peripheral Venous Disease, Osteoarthritis, Confinement Date Acquired: 12/30/2019 History: Anxiety Weeks Of Treatment: 20 Clustered Wound: No Photos Photo Uploaded By: Mikeal Hawthorne on 06/07/2020 13:26:18 Wound Measurements Length: (cm) 1.8 Width: (cm) 1 Depth: (cm) 0.1 Area: (cm) 1.414 Volume: (cm) 0.141 % Reduction in Area: 87.9% % Reduction in Volume: 98.5% Epithelialization: Small (1-33%) Tunneling: No Undermining: No Wound  Description Classification: Full Thickness With Exposed Support Structures Wound Margin: Flat and Intact Exudate Amount: Medium Exudate Type: Serous Exudate Color: amber Foul Odor After Cleansing: No Slough/Fibrino Yes Wound Bed Granulation Amount: Large (67-100%) Exposed Structure Granulation Quality: Red Fascia Exposed: No Necrotic Amount: None Present (0%) Fat Layer (Subcutaneous Tissue) Exposed: Yes Tendon Exposed: No Muscle Exposed: No Joint Exposed: No Bone Exposed: No Treatment Notes Wound #15 (Malleolus) Wound Laterality: Left, Medial Cleanser Soap and Water Discharge Instruction: May shower and wash wound with dial antibacterial soap and water prior to dressing change. Peri-Wound Care Triamcinolone 15 (g) Discharge Instruction: Use triamcinolone 15 (g) as directed Sween Lotion (Moisturizing lotion) Discharge Instruction: Apply moisturizing lotion as directed Topical Primary Dressing Hydrofera Blue Classic Foam, 2x2 in Discharge Instruction: Moisten with saline prior to applying to wound bed. Use hydraferablue until we re-run for Apligraf. Secondary Dressing Woven Gauze Sponge, Non-Sterile 4x4 in Discharge Instruction: Apply over primary dressing as directed. ABD Pad, 5x9 Discharge Instruction: Apply over primary dressing as directed. Secured With Compression Wrap Unnaboot w/Calamine, 4x10 (in/yd) Discharge Instruction: Apply Unnaboot as directed. Compression Stockings Add-Ons Electronic Signature(s) Signed: 06/05/2020 5:38:12 PM By: [name] RN Signed: 06/05/2020 5:39:46 PM By: Baruch Gouty RN, BSN Entered By: Baruch Gouty on 06/05/2020 10:17:15 -------------------------------------------------------------------------------- Vitals Details Patient Name: Date of Service: [name], [name] 06/05/2020 9:45 A M Medical Record Number: [id number] Patient Account Number: [id number] Date of Birth/Sex: Treating RN: 06-26-1955 (65 y.o. [name],  [name] Primary Care Chesky Heyer: [name] Other Clinician: Referring Chera Slivka: Treating Mirta Mally/Extender: [name] in Treatment: 20 Vital Signs Time Taken: 09:58 Temperature (F): 97.9 Height (in): 68 Pulse (bpm): 73 Respiratory Rate (breaths/min): 16 Blood Pressure (mmHg): 122/67 Reference Range: 80 - 120 mg / dl Electronic Signature(s) Signed: 06/05/2020 11:46:23 AM By: [name] Entered By: [name] on 06/05/2020 09:58:53

## 2020-06-05 NOTE — Progress Notes (Signed)
AMYRIE, ILLINGWORTH ([id number]) Visit Report for 06/05/2020 HPI Details Patient Name: Date of Service: [facility] [name], [name] 06/05/2020 9:45 A M Medical Record Number: [id number] Patient Account Number: [id number] Date of Birth/Sex: Treating RN: 12/15/1955 (65 y.o. [name] Primary Care Provider: [name] Other Clinician: Referring Provider: Treating Provider/Extender: [name] in Treatment: 20 History of Present Illness HPI Description: the remaining wound is over the left medial ankle. Similar wound over the right medial ankle healed largely with use of Apligraf. Most recently we have been using Hydrofera Blue over this wound with considerable improvement. The patient has been extensively worked up in the past for her venous insufficiency and she is not a candidate for antireflux surgery although I have none of the details available currently. 08/24/14; considerable improvement today. About 50% of this wound areas now epithelialized. The base of the wound appears to be healthier granulation.as opposed to last week when she had deteriorated a considerable improvement 08/17/14; unfortunately the wound has regressed somewhat. The areas of epithelialization from the superior aspect are not nearly as healthy as they were last week. The patient thinks her Hydrofera Blue slipped. 09/07/14; unfortunately the area has markedly regressed in the 2 weeks since I've seen this. There is an odor surrounding erythema. The healthy granulation tissue that we had at the base of the wound now is a dusky color. The nurse reports green drainage 09/14/14; the area looks somewhat better than last week. There is less erythema and less drainage. The culture I did did not show any growth. Nevertheless I think it is better to continue the Cipro and doxycycline for a further week. The remaining wound area was debridement. 09/21/14. Wound did not require debridement last week. Still less  erythema and less drainage. She can complete her antibiotics. The areas of epithelialization in the superior aspect of the wound do not look as healthy as they did some weeks ago 10/05/14 continued improvement in the condition of this wound. There is advancing epithelialization. Less aggressive debridement required 10/19/14 continued improvement in the condition and volume of this wound. Less aggressive debridement to the inferior part of this to remove surface slough and fibrinous eschar 11/02/14 no debridement is required. The surface granulation appears healthy although some of her islands of epithelialization seem to have regressed. No evidence of infection 11/16/14; lites surface debridement done of surface eschar. The wound does not look to be unhealthy. No evidence of infection. Unfortunately the patient has had podiatry issues in the right foot and for some reason has redeveloped small surface ulcerations in the medial right ankle. Her original presentation involved wounds in this area 11/23/14 no debridement. The area on the right ankle has enlarged. The left ankle wound appears stable in terms of the surface although there is periwound inflammation. There has been regression in the amount of new skin 11/30/14 no debridement. Both wound areas appear healthy. There was no evidence of infection. The the new area on the right medial ankle has enlarged although that both the surfaces appear to be stable. 12/07/14; Debridement of the right medial ankle wound. No no debridement was done on the left. 12/14/14 no major change in and now bilateral medial ankle wounds. Both of these are very painful but the no overt evidence of infection. She has had previous venous ablation 12/21/14; patient states that her right medial ankle wound is considerably more painful last week than usual. Her left is also somewhat painful. She could not  tolerate debridement. The right medial ankle wound has fibrinous surface  eschar 12/28/14 this is a patient with severe bilateral venous insufficiency ulcers. For a considerable period of time we actually had the one on the right medial ankle healed however this recently opened up again in June. The left medial ankle wound has been a refractory area with some absent flows. We had some success with Hydrofera Blue on this area and it literally closed by 50% however it is recently opened up Foley. Both of these were debridement today of surface eschar. She tolerates this poorly 01/25/15: No change in the status of this. Thick adherent escar. Very poor tolerance of any attempt at debridement. I had healed the right medial malleolus wound for a considerable amount of time and had the left one down to about 50% of the volume although this is totally regressed over the last 48 weeks. Further the right leg has reopened. she is trying to make a appointment with pain and vascular, previous ablations with Dr. Aleda Grana. I do not believe there is an arterial insufficiency issue here 02/01/15 the status of the adherent eschar bilaterally is actually improved. No debridement was done. She did not manage to get vascular studies done 02/08/15 continued debridement of the area was done today. The slough is less adherent and comes off with less pressure. There is no surrounding infection peripheral pulses are intact 02/15/15 selective debridement with a disposable curette. Again the slough is less adherent and comes off with less difficulty. No surrounding infection peripheral pulses are intact. 02/22/15 selective debridement of the right medial ankle wound. Slough comes off with less difficulty. No obvious surrounding infection peripheral pulses are intact I did not debridement the one on the left. Both of these are stable to improved 03/01/15 selective debridement of both wound areas using a curette to. Adherent slough cup soft with less difficulty. No obvious surrounding infection.  The patient tells me that 2 days ago she noted a rash above the right leg wrap. She did not have this on her lower legs when she change this over she arrives with widespread left greater than right almost folliculitis-looking rash which is extremely pruritic. I don't see anything to culture here. There is no rash on the rest of her body. She feels well systemically. 03/08/15; selective debridement of both wounds using a curette. Base of this does not look unhealthy. She had limegreen drainage coming out of the left leg wound and describes a lot of drainage. The rash on her left leg looks improved to. No cultures were done. 03/22/15; patient was not here last week. Basal wounds does not look healthy and there is no surrounding erythema. No drainage. There is still a rash on the left leg that almost looks vasculitic however it is clearly limited to the top of where the wrap would be. 04/05/15; on the right required a surgical debridement of surface eschar and necrotic subcutaneous tissue. I did not debridement the area on the left. These continue to be large open wounds that are not changing that much. We were successful at one point in healing the area on the right, and at the same time the area on the left was roughly half the size of current measurements. I think a lot of the deterioration has to do with the prolonged time the patient is on her feet at work 04/19/15 I attempted-like surface debridement bilaterally she does not tolerate this. She tells me that she was in allergic care yesterday with  extreme pain over her left lateral malleolus/ankle and was told that she has an "sprain" 05/03/15; large bilateral venous insufficiency wounds over the medial malleolus/medial aspect of her ankles. She complains of copious amounts of drainage and his usual large amounts of pain. There is some increasing erythema around the wound on the right extending into the medial aspect of her foot to. historically she  came in with these wounds the right one healed and the left one came down to roughly half its current size however the right one is reopened and the left is expanded. 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 05/10/15 large bilateral venous insufficiency wounds. There is less adherence surface left however the surface culture that I did last week grew pseudomonas therefore bilateral selective debridement score necessary. There is surrounding erythema. The patient describes severe bilateral drainage and a lot of pain in the left ankle. Apparently her podiatrist was were ready to do a cortisone shot 05/17/15; the patient complains of pain and again copious amounts of drainage. 05/24/15; we used Iodo flex last week. Patient notes considerable improvement in wound drainage. Only needed to change this once. 05/31/15; we continued Iodoflex; the base of these large wounds bilaterally is not too bad but there is probably likely a significant bioburden here. I would like to debridement just doesn't tolerate it. 06/06/14 I would like to continue the Iodoflex although she still hasn't managed to obtain supplies. She has bilateral medial malleoli or large wounds which are mostly superficial. Both of them are covered circumferentially with some nonviable fibrinous slough although she tolerates debridement very poorly. She apparently has an appointment for an ablation on the right leg by interventional radiology. 06/14/15; the patient arrives with the wounds and static condition. We attempted a debridement although she does not do well with this secondary to pain. I 07/05/15; wounds are not much smaller however there appears to be a cleaner granulating base. The left has tight fibrinous slough greater than the right. Debridement is tolerated poorly due to pain. Iodoflex is done more for these wounds in any of the multitude of different dressings I have tried on the left  1 and then subsequently the right. 07/12/15; no change in the condition of this wound. I am able to do an aggressive debridement on the right but not the left. She simply cannot tolerate it. We have been using Iodoflex which helps somewhat. It is worthwhile remembering that at one point we healed the right medial ankle wound and the left was about 25% of the current circumference. We have suggested returning to vascular surgery for review of possible further ablations for one reason or another she has not been able to do this. 07/26/15 no major change in the condition of either wound on her medial ankle. I did not attempt to debridement of these. She has been aggressively scrubbing these while she is in the shower at home. She has her supply of Iodoflex which seems to have done more for these wounds then anything I have put on recently. 08/09/15 wound area appears larger although not verified by measurements. Using Iodoflex 09/05/2015 -- she was here for avisit today but had significant problems with the wound and I was asked to see her for a physician opinion. I have summarize that this lady has had surgery on her left lower extremity about 10 years ago where the possible veins stripping was done. She has  had an opinion from interventional radiology around November 2016 where no further sclerotherapy was ordered. The patient works 12 hours a day and stands on a concrete floor with work boots and is unable to get the proper compression she requires and cannot elevate her limbs appropriately at any given time. She has recently grown Pseudomonas from her wound culture but has not started her ciprofloxacin which was called in for her. 09/13/15 this continues to be a difficult situation for this patient. At one point I had this wound down to a 1.5 x 1.5" wound on her left leg. This is deteriorated and the right leg has reopened. She now has substantial wounds on her medial calcaneus, malleoli and into her  lower leg. One on the left has surface eschar but these are far too painful for me to debridement here. She has a vascular surgery appointment next week to see if anything can be done to help here. I think she has had previous ablations several years ago at [facility]. She has no major edema. She tells me that she did not get product last time [facility]] and went for several days without it. She continues to work in work boots 12 hours a day. She cannot get compression/4-layer under her work boots. 09/20/15 no major change. Periwound edema control was not very good. Her point with pain and vascular is next Wednesday the 25th 09/28/15; the patient is seen vascular surgery and is apparently scheduled for repeat duplex ultrasounds of her bilateral lower legs next week. 10/05/15; the patient was seen by Dr. Doren Custard of vascular surgery. He feels that she should have arterial insufficiency excluded as cause/contributed to her nonhealing stage she is therefore booked for an arteriogram. She has apparently monophasic signals in the dorsalis pedis pulses. She also of course has known severe chronic venous insufficiency with previous procedures as noted previously. I had another long discussion with the patient today about her continuing to work 12 hour shifts. I've written her out for 2 months area had concerns about this as her work location is currently undergoing significant turmoil and this may lead to her termination. She is aware of this however I agree with her that she simply cannot continue to stand for 12 hours multiple days a week with the substantial wound areas she has. 10/19/15; the Dr. Doren Custard appointment was largely for an arteriogram which was normal. She does not have an arterial issue. He didn't make a comment about her chronic venous insufficiency for which she has had previous ablations. Presumably it was not felt that anything additional could be done. The patient is now out of work as I  prescribed 2 weeks ago. Her wounds look somewhat less aggravated presumably because of this. I felt I would give debridement another try today 10/25/15; no major change in this patient's wounds. We are struggling to get her product that she can afford into her own home through her insurance. 11/01/15; no major change in the patient's wounds. I have been using silver alginate as the most affordable product. I spoke to Dr. Marla Roe last week with her requested take her to the OR for surgical debridement and placement of ACEL. Dr. Marla Roe told me that she would be willing to do this however [name] will not cover this, fortunately the patient has [place] healthcare of some variant 11/08/15; no major change in the patient's wounds. She has been completely nonviable surface that this but is in too much pain with any  attempted debridement are clinic. I have arranged for her to see Dr. Marla Roe ham of plastic surgery and this appointment is on Monday. I am hopeful that they will take her to the OR for debridement, possible ACEL ultimately possible skin graft 11/22/15 no major change in the patient's wounds over her bilateral medial calcaneus medial malleolus into the lower legs. Surface on these does not look too bad however on the left there is surrounding erythema and tenderness. This may be cellulitis or could him sleepy tinea. 11/29/15; no major changes in the patient's wounds over her bilateral medial malleolus. There is no infection here and I don't think any additional antibiotics are necessary. There is now plan to move forward. She sees Dr. Marla Roe in a week's time for preparation for operative debridement and ACEL placement I believe on 7/12. She then has a follow-up appointment with Dr. Marla Roe on 7/21 12/28/15; the patient returns today having been taken to the [place] by Dr. Marla Roe 12/12/15 she underwent debridement, intraoperative cultures [which were negative]. She had  placement of a wound VAC. Parent really ACEL was not available to be placed. The wound VAC foam apparently adhered to the wound since then she's been using silver alginate, Xeroform under Ace wraps. She still says there is a lot of drainage and a lot of pain 01/31/16; this is a patient I see monthly. I had referred her to Dr. Marla Roe him of plastic surgery for large wounds on her bilateral medial ankles. She has been to the OR twice once in early July and once in early August. She tells me over the last 3 weeks she has been using the wound VAC with ACEL underneath it. On the right we've simply been using silver alginate. Under Kerlix Coban wraps. 02/28/16; this is a patient I'm currently seeing monthly. She is gone on to have a skin graft over her large venous insufficiency ulcer on the left medial ankle. This was done by Dr. Marla Roe him. The patient is a bit perturbed about why she didn't have one on her right medial ankle wound. She has been using silver alginate to this. 03/06/16; I received a phone call from her plastic surgery Dr. Marla Roe. She expressed some concern about the viability of the skin graft she did on the left medial ankle wound. Asked me to place Endoform on this. She told me she is not planning to do a subsequent skin graft on the right as the left one did not take very well. I had placed Hydrofera Blue on the right 03/13/16; continue to have a reasonably healthy wound on the right medial ankle. Down to 3 mm in terms of size. There is epithelialization here. The area on the left medial ankle is her skin graft site. I suppose the last week this looks somewhat better. She has an open area inferiorly however in the center there appears to be some viable tissue. There is a lot of surface callus and eschar that will eventually need to come off however none of this looked to be infected. Patient states that the is able to keep the dressing on for several days which is an  improvement. 03/20/16 no major change in the circumference of either wound however on the left side the patient was at Dr. Eusebio Friendly office and they did a debridement of left wound. 50% of the wound seems to be epithelialized. I been using Endoform on the left Hydrofera Blue in the right 03/27/16; she arrives today with her wound is not  looking as healthy as they did last week. The area on the right clearly has an adherent surface to this a very similar surface on the left. Unfortunately for this patient this is all too familiar problem. Clearly the Endoform is not working and will need to change that today that has some potential to help this surface. She does not tolerate debridement in this clinic very well. She is changing the dressing wants 04/03/16; patient arrives with the wounds looking somewhat better especially on the right. Dr. Migdalia Dk change the dressing to silver alginate when she saw her on Monday and also sold her some compression socks. The usefulness of the latter is really not clear and woman with severely draining wounds. 04/10/16; the patient is doing a bit of an experiment wearing the compression stockings that Dr. Migdalia Dk provided her to her left leg and the out of legs based dressings that we provided to the right. 05/01/16; the patient is continuing to wear compression stockings Dr. Migdalia Dk provided her on the left that are apparently silver impregnated. She has been using Iodoflex to the right leg wound. Still a moderate amount of drainage, when she leaves here the wraps only last for 4 days. She has to change the stocking on the left leg every night 05/15/16; she is now using compression stockings bilaterally provided by Dr. Marla Roe. She is wearing a nonadherent layer over the wounds so really I don't think there is anything specific being done to this now. She has some reduction on the left wound. The right is stable. I think all healing here is being done without a  specific dressing 06/09/16; patient arrives here today with not much change in the wound certainly in diameter to large circular wounds over the medial aspect of her ankle bilaterally. Under the light of these services are certainly not viable for healing. There is no evidence of surrounding infection. She is wearing compression stockings with some sort of silver impregnation as prescribed by Dr. Marla Roe. She has a follow-up with her tomorrow. 06/30/16; no major change in the size or condition of her wounds. These are still probably covered with a nonviable surface. She is using only her purchase stockings. She did see Dr. Marla Roe who seemed to want to apply Dakin's solution to this I'm not extreme short what value this would be. I would suggest Iodoflex which she still has at home. 07/28/16; I follow Mrs. Pfister episodically along with Dr. Marla Roe. 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 She has been applying a topical collagen ointment to both wounds with Adaptic. I don't think Dr. Marla Roe is planning to take her back to the OR. 08/19/16; I follow Mrs. Jeneen Rinks on a monthly basis along with Dr. Marla Roe of plastic surgery. 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 I been following her for a number of years. At one point I was able to get the right medial malleolus wound to heal and had the left medial malleolus down to about half its current size however and I had to send her to plastic surgery for an operative debridement. Since then things have been stable to slightly improve the area on the right is slightly better one in the left about the same although there is much less adherent surface than I'm used to with this patient. She is using some form of liquid collagen gel that Dr. Marla Roe provided a Kerlix  cover with the patient's own pressure stockings.  She tells me that she has extreme pain in both ankles and along the lateral aspect of both feet. She has been unable to work for some period of time. She is telling me she is retiring at the beginning of April. She sees Dr. Doran Durand of orthopedics next week 09/22/16; patient has not seen Dr. Marla Roe since the last time she is here. I'm not really sure what she is using to the wounds other than bits and pieces of think she had left over including most recently Hydrofera Blue. She is using juxtalite stockings. She is having difficulty with her husband's recent illness "stroke". She is having to transport him to various doctors appointments. Dr. Marla Roe left her the option of a repeat debridement with ACEL however she has not been able to get the time to follow-up on this. She continues to have a fair amount of drainage out of these wounds with certainly precludes leaving dressings on all week 10/13/16; patient has not seen Dr. Marla Roe since she was last in our clinic. I'm not really sure what she is doing with the wounds, we did try to get her [facility] and I think she is actually using this most of the time. Because of drainage she states she has to change this every second day although this is an improvement from what she used to do. She went to see Dr. Doran Durand who did not think she had a muscular issue with regards to her feet, he referred her to a neurologist and I think the appointment is sometime in June. I changed her back to Iodoflex which she has used in the past but not recently. 11/03/16; the patient has been using Iodoflex although she ran out of this. Still claims that there is a lot of drainage although the wound does not look like this. No surrounding erythema. She has not been back to see Dr. Marla Roe 11/24/16; the patient has been using Iodoflex again but she ran out of it 2 or 3 days ago. There is no major change in the condition of either one of these wounds in fact they are  larger and covered in a thick adherent surface slough/nonviable tissue especially on the left. She does not tolerate mechanical debridement in our clinic. Going back to see Dr. Marla Roe of plastic surgery for an operative debridement would seem reasonable. 12/15/16; the patient has not been back to see Dr. Marla Roe. She is been dealing with a series of illnesses and her husband which of monopolized her time. She is been using Sorbact which we largely supplied. She states the drainage is bad enough that it maximum she can go 2-3 days without changing the dressing 01/12/2017 -- the patient has not been back for about 4 weeks and has not seen Dr. Marla Roe not does she have any appointment pending. 01/23/17; patient has not seen Dr. Marla Roe even though I suggested this previously. She is using Santyl that was suggested last week by Dr. Con Memos this Cost her $16 through her insurance which is indeed surprising 02/12/17; continuing Santyl and the patient is changing this daily. A lot of drainage. She has not been back to see plastic surgery she is using an Ace wrap. Our intake nurse suggested wrap around stockings which would make a good reasonable alternative 02/26/17; patient is been using Santyl and changing this daily due to drainage. She has not been to see plastic surgery she uses in April Ace wrap to control the  edema. She did obtain extremitease stockings but stated that the edema in her leg was to big for these 03/20/17; patient is using Santyl and Anasept. Surfaces looked better today the area on the right is actually measuring a little smaller. She has states she has a lot of pain in her feet and ankles and is asking for a consult to pain control which I'll try to help her with through our case manager. 04/10/17; the patient arrives with better-looking wound surfaces and is slightly smaller wound on the left she is using a combination of Santyl and Anasept. She has an appointment or at least as  started in the pain control center associated with [facility] 05/14/17; this is a patient who I followed for a prolonged period of time. 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 At one point I had this down to a much smaller wound on the left however these reopened and we've never been able to get these to heal. She has been using Santyl and Anasept gel although 2 weeks ago she ran out of the Anasept gel. She has a stable appearance of the wound. She is going to the wound care clinic at [facility]. They wanted do a nerve block/spinal block although she tells me she is reluctant to go forward with that. 05/21/17; this is a patient I have followed for many years. 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 Chronic pain and deformity in her ankles as well. She is been to see plastic surgery as well as orthopedics. Using PolyMem AG most recently/Kerramax/ABDs and 2 layer compression. She has managed to keep this on and she is coming in for a nurse check to change the dressing on Tuesdays, we see her on Fridays 06/05/17; really quite a good looking surface and the area especially on the right medial has contracted in terms of dimensions. Well granulated healthy-looking tissue on both sides. Even with an open curet there is nothing that even feels abnormal here. This is as good as I've seen this in quite some time. We have been using PolyMem AG and bringing her in for a nurse check 06/12/17; really quite good surface on both of these wounds. The right medial has contracted a bit left is not. We've been using PolyMem and AG and she is coming in for a nurse visit 06/19/17; we have been using PolyMem AG and bringing her in for a nurse check. Dimensions of her wounds are not better but the surfaces looked better bilaterally. She complained of bleeding last night and the left wound and increasing pain bilaterally. She states her wound pain is more  neuropathic than just the wounds. There was some suggestion that this was radicular from her pain management doctor in talking to her it is really difficult to sort this out. 06/26/17; using PolyMem and AG and bringing her in for a nurse check as All of this and reasonably stable condition. Certainly not improved. The dimensions on the lateral part of the right leg look better but not really measuring better. The medial aspect on the left is about the same. 07/03/16; we have been using PolyMen AG and bringing her in for a nurse check to change the dressings as the wounds have drainage which precludes once weekly changing. We are using all secondary absorptive dressings.our intake nurse is brought up the idea of using a wound VAC/snap VAC on the wound to help with the drainage to see if this would  result in some contraction. This is not a bad idea. The area on the right medial is actually looking smaller. Both wounds have a reasonable-looking surface. There is no evidence of cellulitis. The edema is well controlled 07/10/17; the patient was denied for a snap VAC by her insurance. The major issue with these wounds continues to be drainage. We are using wicked PolyMem AG and she is coming in for a nurse visit to change this. The wounds are stable to slightly improved. The surface looks vibrant and the area on the right certainly has shrunk in size but very slowly 07/17/17; the patient still has large wounds on her bilateral medial malleoli. Surface of both of these wounds looks better. The dimensions seem to come and go but no consistent improvement. There is no epithelialization. We do not have options for advanced treatment products due to insurance issues. They did not approve of the wound VAC to help control the drainage. More recently we've been using PolyMem and AG wicked to allow drainage through. We have been bringing her in for a nurse visit to change this. We do not have a lot of options for wound  care products and the home again due to insurance issues 07/24/17; the patient's wound actually looks somewhat better today. No drainage measurements are smaller still healthy-looking surface. We used silver collagen under PolyMen started last week. We have been bringing her in for a dressing change 07/31/17; patient's wound surface continued to look better and I think there is visible change in the dimensions of the wound on the right. Rims of epithelialization. We have been using silver collagen under PolyMen and bringing her in for a dressing change. There appears to be less drainage although she is still in need of the dressing change 08/07/17. Patient's wound surface continues to look better on both sides and the area on the right is definitely smaller. We have been using silver collagen and PolyMen. She feels that the drainage has been it has been better. I asked her about her vascular status. She went to see Dr. Aleda Grana at [facility] and had some form of ablation. I don't have much detail on this. I haven't my notes from 2016 that she was not a candidate for any further ablation but I don't have any more information on this. We had referred her to vein and vascular I don't think she ever went. He does not have a history of PAD although I don't have any information on this either. We don't even have ABIs in our record 08/14/17; we've been using silver collagen and PolyMen cover. And putting the patient and compression. She we are bringing her in as a nurse visit to change this because ofarge amount of drainage. We didn't the ABIs in clinic today since they had been done in many moons 1.2 bilaterally. She has been to see vein and vascular however this was at [facility] and she had ablation although I really don't have any information on this all seemed biking get a report. She is also been operatively debrided by plastic surgery and had a cell placed probably 8-12 months ago. This didn't have  a major effect. We've been making some gains with current dressings 08/19/17-She is here in follow-up evaluation for bilateral medial malleoli ulcers. She continues to tolerate debridement very poorly. We will continue with recently changed topical treatment; if no significant improvement may consider switching to Iodosorb/Iodoflex. 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 She has been using silver collagen and PolyMem. I believe she has been used and tried on Iodoflex before. 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 These have been very refractory over time. We have been using silver collagen and PolyMen. She is coming in once a week for a doctor's and once a week for nurse visits. We are actually making some progress 09/18/17; the patient's wounds are smaller especially on the right medial. She arrives today to upset to consider even washing these off with Anasept which I think is been part of the reason this is been closing. We've been using collagen covered in PolyMen otherwise. It is noted that she has a small area of folliculitis on the right medial calf that. As we are wrapping her legs I'll give her a short course of doxycycline to make sure this doesn't amount to anything. She is a long list of complaints today including imbalance, shortness of breath on exertion, inversion of her left ankle. With regards to the latter complaints she is been to see orthopedics and they offered her a tendon release surgery I believe but wanted her wounds to be closed first. I have recommended she go see her primary physician with regards to everything else. 09/25/17; patient's wounds are about the same size. We have made some progress bilaterally although not in recent  weeks. She will not allow me T wash these wounds with Anasept even if she is doing her cell. Wheeze we've been using collagen covered in PolyMen. Last week she had a small area of folliculitis this is now opened into a small wound. She completed 5 days of trimethoprim sulfamethoxazole 10/02/17; unfortunately the area on her left medial ankle is worse with a larger wound area towards the Achilles. The patient complains of a lot of pain. She will not allow debridement although visually I don't think there is anything to debridement in any case. We have been using silver collagen and PolyMen for several months now. Initially we are making some progress although I'm not really seeing that today. We will move back to [facility]. His admittedly this is a bit of a repeat however I'm hoping that his situation is different now. The patient tells me she had her leg on the left give out on her yesterday this is process some pain. 10/09/17; the patient is seen twice a week largely because of drainage issues coming out of the chronic medial bimalleolar wounds that are chronic. Last week the dimensions of the one on the left looks a little larger I changed her to [facility]. She comes in today with a history of terrible pain in the bilateral wound areas. She will not allow debridement. She will not even allow a tissue culture. There is no surrounding erythema no no evidence of cellulitis. We have been putting her Kerlix Coban man. She will not allow more aggressive compression as there was a suggestion to put her in 3 layer wraps. 10/16/17; large wounds on her bilateral medial malleoli. These are chronic. Not much change from last week. The surface looks have healthy but absolutely no epithelialization. A lot of pain little less so of drainage. She will not allow debridement or even washing these off in the vigorous fashion with Anasept. 10/23/17; large wounds on her bilateral malleoli which are  chronic. Some  improvement in terms of size perhaps on the right since last time I saw these. She states that after we increased the 3 layer compression there was some bleeding, when she came in for a nurse visit she did not want 3 layer compression put back on about our nurse managed to convince her. She has known chronic venous visit issues and I'm hoping to get her to tolerate the 3 layer compression. using Hydrofera Blue 10/30/17; absolutely no change in the condition of either wound although we've had some improvement in dimensions on the right.. Attempted to put her in 3 layer compression she didn't tolerated she is back in 2 layer compression. We've been using Hydrofera Blue We looked over her past records. She had venous reflux studies in November 2016. There was no evidence of deep venous reflux on the right. Superficial vein did not show the greater saphenous vein at think this is been previously ablated the small saphenous vein was within normal limits. The left deep venous system showed no DVT the vessels were positive for deep venous reflux in the posterior tibial veins at the ankle. The greater saphenous vein was surgically absent small saphenous vein was within normal limits. She went to vein and vascular at [facility]. I believe she had an ablation on the left greater saphenous vein. I'll update her reflux studies perhaps ever reviewed by vein and vascular. We've made absolutely no progress in these wounds. Will also try to read and TheraSkins through her insurance 11/06/17; W the patient apparently has a 2 week follow-up with vein and vascular I like him to review the whole issue with regards to her previous vascular workup by Dr. Aleda Grana. We've really made no progress on these wounds in many months. She arrives today with less viable looking surface on the left medial ankle wound. This was apparently looking about the same on Tuesday when she was here for nurse visit. 11/13/17; deep tissue  culture I did last time of the left lower leg showed multiple organisms without any predominating. In particular no Staphylococcus or group A strep were isolated. We sent her for venous reflux studies. She's had a previous left greater saphenous vein stripping and I think sclerotherapy of the right greater saphenous vein. She didn't really look at the lesser saphenous vein this both wounds are on the medial aspect. She has reflux in the common femoral vein and popliteal vein and an accessory vein on the right and the common femoral vein and popliteal vein on the left. I'm going to have her go to see vein and vascular just the look over things and see if anything else beside aggressive compression is indicated here. We have not been able to make any progress on these wounds in spite of the fact that the surface of the wounds is never look too bad. 11/20/17; no major change in the condition of the wounds. Patient reports a large amount of drainage. She has a lot of complaints of pain although enlisting her today I wonder if some of this at least his neuropathic rather than secondary to her wounds. She has an appointment with vein and vascular on 12/30/17. The refractory nature of these wounds in my mind at least need vein and vascular to look over the wounds the recent reflux studies we did and her history to see if anything further can be done here. I also note her gait is deteriorated quite a bit. Looks like she has inversion of her  foot on the right. She has a bilateral Trendelenburg gait. I wonder if this is neuropathic or perhaps multilevel radicular. 11/27/17; her wounds actually looks slightly better. Healthy-looking granulation tissue a scant amount of epithelialization. [place] healthcare will not pay for [employer]. They will play for tri layer Oasis and Dermagraft. This is not a diabetic ulcer. We'll try for the tri layer Oasis. She still complains of some drainage. She has a vein and vascular  appointment on 12/30/17 12/04/17; the wounds visually look quite good. Healthy-looking granulation with some degree of epithelialization. We are still waiting for response to our request for trial to try layer Oasis. Her appointment with vascular to review venous and arterial issues isn't sold the end of July 7/31. Not allow debridement or even vigorous cleansing of the wound surface. 12/18/17; slightly smaller especially on the right. 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 We've been using Hydrofera Blue. We still are looking into triple layer Oasis through her insurance 01/08/18 on evaluation today patient's wound actually appears to be showing signs of good improvement at this point in time. She has been tolerating the dressing changes without complication. Fortunately there does not appear to be any evidence of infection at this point in time. We have been utilizing silver nitrate which does seem to be of benefit for her which is also good news. Overall I'm very happy with how things seem to be both regards appearance as well as measurement. Patient did see Dr. Bridgett Larsson for evaluation on 12/30/17. In his assessment he felt that stripping would not likely add much more than chronic compression to the patient's healing process. His recommendation was to follow-up in three months with Dr. Doren Custard if she hasn't healed in order to consider referral back to you and see vascular where she previously was in a trial and was able to get her wound to heal. I'll be see what she feels she when you staying compression and he reiterated this as well. 01/13/18 on evaluation today patient appears to actually be doing very well in regard to her bilateral medial malleolus ulcers. She seems to have tolerated the chemical cauterization with silver nitrate last week she did have some pain through that evening but fortunately states that I'll be see since it seems to be doing better she is overall  pleased with the progress. 01/21/18; really quite a remarkable improvement since I've last seen these wounds. We started using silver nitrate specially on the islands of hyper granulation which for some reason her around the wound circumference. This is really done quite nicely. Primary dressing Hydrofera Blue under 4 layer compression. She seems to be able to hold out without a nurse rewrap. Follow-up in 1 week 01/28/18; we've continued the hydrofera blue but continued with chemical cauterization to the wound area that we started about a month ago for irregular hyper granulation. She is made almost stunning improvement in the overall wound dimensions. I was not really expecting this degree of improvement in these chronic wounds 02/05/18; we continue with Hydrofera Bluebut of also continued the aggressive chemical cauterization with silver nitrate. We made nice progress with the right greater than left wound. 02/12/18. We continued with Hydrofera Blue after aggressive chemical cauterization with silver nitrate. We appear to be making nice progress with both wound areas 02/19/2018; we continue with [facility] after washing the wounds vigorously with Anasept spray and chemical cauterization with silver nitrate. We are making excellent progress. The area on the right's just about closed 02/26/2018.  The area on the left medial ankle had too much necrotic debris today. I used a #5 curette we are able to get most of the soft. I continued with the silver nitrate to the much smaller wound on the right medial ankle she had a new area on her right lower pretibial area which she says was due to a role in her compression 03/05/2018; both wound areas look healthy. Not much change in dimensions from last week. I continue to use silver nitrate and Hydrofera Blue. The patient saw Dr. Doren Custard of vein and vascular. He felt she had venous stasis ulcers. He felt based on her previous arteriogram she should have  adequate circulation for healing. Also she has deep venous reflux but really no significant correctable superficial venous reflux at this time. He felt we should continue with conservative management including leg elevation and compression 04/02/2018; since we last saw this woman about a month ago she had a fall apparently suffered a pelvic fracture. I did not look up the x-ray. Nevertheless because of pain she literally was bedbound for 2 weeks and had home health coming out to change the dressing. Somewhat predictably this is resulted in considerable improvement in both wound areas. The right is just about closed on the medial malleolus and the left is about half the size. 04/16/2018; both her wounds continue to go down in size. Using Hydrofera Blue. 05/07/18; both her wounds appeared to be improving especially on the right where it is almost closed. We are using Hydrofera Blue 05/14/2018; slightly worse this week with larger wounds. Surface on the left medial not quite as good. We have been using Hydrofera Blue 05/21/18; again the wounds are slightly larger. Left medial malleolus slightly larger with eschar around the circumference. We have been using Hydrofera Blue undergoing a wraps for a prolonged period of time. This got a lot better when she was more recumbent due to a fall and a back injury. I change the primary dressing the silver alginate today. She did not tolerate a 4 layer compression previously although I may need to bring this up with her next time 05/28/2018; area on the left medial malleolus again is slightly larger with more drainage. Area on the right is roughly unchanged. She has a small area of folliculitis on the right medial just on the lower calf. This does not look ominous. 06/03/2018 left medial malleolus slightly smaller in a better looking surface. We used silver nitrate on this last time with silver alginate. The area on the right appears slightly smaller 1/10; left medial  malleolus slightly smaller. Small open area on the right. We used silver nitrate and silver alginate as of 2 weeks ago. We continue with the wound and compression. These got a lot better when she was off her feet 1/17; right medial malleolus wound is smaller. The left may be slightly smaller. Both surfaces look somewhat better. 1/24; both wounds are slightly smaller. Using silver alginate under Unna boots 1/31; both wounds appear smaller in fact the area on the right medial is just about closed. Surface eschar. We have been using silver alginate under Unna boots. The patient is less active now spends let much less time on her feet and I think this is contributed to the general improvement in the wound condition 2/7; both wounds appear smaller. I was hopeful the right medial would be closed however there there is still the same small open area. Slight amount of surface eschar on the left the dimensions  are smaller there is eschar but the wound edges appear to be free. We have been using silver alginate under Unna boot's 2/14; both wounds once again measure smaller. Circumferential eschar on the left medial. We have been using silver alginate under Unna boots with gradual improvement 2/21; the area on the right medial malleolus has healed. The area on the left is smaller. We have been using silver alginate and Unna boots. We can discharge wrapping the right leg she has 20/30 stockings at home she will need to protect the scar tissue in this area 2/28; the area on the right medial malleolus remains closed the patient has a compression stocking. The area on the left is smaller. We have been using silver alginate and Unna boots. 3/6 the area on the right medial ankle remains closed. Good edema control noted she is using her own compression stocking. The area on the left medial ankle is smaller. We have been managing this with silver alginate and Unna boots which we will continue today. 3/13; the area on  the right medial ankle remains closed and I'm declaring it healed today. When necessary the left is about the same still a healthy-looking surface but no major change and wound area. No evidence of infection and using silver alginate under unna and generally making considerable improvement 3/27 the area on the right medial ankle remains closed the area on the left is about the same as last week. Certainly not any worse we have been using silver alginate under an Unna boot 4/3; the area on the right medial ankle remains closed per the patient. We did not look at this wound. The wound on the left medial ankle is about the same surface looks healthy we have been using silver alginate under an Unna boot 4/10; area on the right medial ankle remains closed per the patient. We did not look at this wound. The wound on the left medial ankle is slightly larger. The patient complains that the [facility] caused burning pain all week. She also told us that she was a lot more active this week. Changed her back to silver alginate 4/17; right medial ankle still closed per the patient. Left medial ankle is slightly larger. Using silver alginate. She did not tolerate Hydrofera Blue on this area 4/24; right medial ankle remains closed we have not look at this. The left medial ankle continues to get larger today by about a centimeter. We have been using silver alginate under Unna boots. She complains about 4 layer compression as an alternative. She has been up on her feet working on her garden 5/8; right medial ankle remains closed we did not look at this. The left medial ankle has increased in size about 100%. We have been using silver alginate under Unna boots. She noted increased pain this week and was not surprised that the wound is deteriorated 5/15; no major change in SA however much less erythema ( one week of doxy ocellulitis). 5/22-65 year old female returns at 1 week to the clinic for left medial ankle  wound for which we have been using silver alginate under 3 layer compression She was placed on DOXY at last visit - the wound is wider at this visit. She is in 3 layer compression 5/29; change to [facility] last week. I had given her empiric doxycycline 2 weeks ago for a week. She is in 3 layer compression. She complains of a lot of pain and drainage on presentation today. 6/5; using Hydrofera Blue. I  gave her doxycycline recently empirically for erythema and pain around the wound. Believe her cultures showed enterococcus which not would not have been well covered by doxycycline nevertheless the wound looks better and I don't feel specifically that the enterococcus needs to be covered. She has a new what looks like a wrap injury on her lateral left ankle. 6/12; she is using Hydrofera Blue. She has a new area on the left anterior lower tibial area. This was a wrap injury last week. 6/19; the patient is using Hydrofera Blue. She arrived with marked inflammation and erythema around the wound and tenderness. 12/01/18 on evaluation today patient appears to be doing a little bit better based on what I'm hearing from the standpoint of lassos evaluation to this as far as the overall appearance of the wound is concerned. Then sometime substandard she typically sees Dr. Dellia Nims. Nonetheless overall very pleased with the progress that she's made up to this point. No fevers, chills, nausea, or vomiting noted at this time. 7/10; some improvement in the surface area. Aggressively debrided last week apparently. I went ahead with the debridement today although the patient does not tolerate this very well. We have been using Iodoflex. Still a fair amount of drainage 7/17; slightly smaller. Using Iodoflex. 7/24; no change from last week in terms of surface area. We have been using Iodoflex. Surface looks and continues to look somewhat better 7/31; surface area slightly smaller better looking surface. We have been  using Iodoflex. This is under Unna boot compression 8/7-Patient presents at 1 week with Unna boot and Iodoflex, wound appears better 8/14-Patient presents at 1 week with Iodoflex, we use the Unna boot, wound appears to be stable better.Patient is getting Botox treatment for the inversion of the foot for tendon release, Next week 8/21; we are using Iodoflex. Unna boot. The wound is stable in terms of surface area. Under illumination there is some areas of the wound that appear to be either epithelialized or perhaps this is adherent slough at this point I was not really clear. It did not wipe off and I was reluctant to debride this today. 8/28; we are using Iodoflex in an Unna boot. Seems to be making good improvement. 9/4; using Iodoflex and wound is slightly smaller. 9/18; we are using Iodoflex with topical silver nitrate when she is here. The wound continues to be smaller 10/2; patient missed her appointment last week due to GI issues. She left and Iodoflex based dressing on for 2 weeks. Wound is about the same size about the size of a dime on the left medial lower 10/9 we have been using Iodoflex on the medial left ankle wound. She has a new superficial probable wrap injury on the dorsal left ankle 10/16; we have been using Hydrofera Blue since last week. This is on the left medial ankle 10/23; we have been using Hydrofera Blue since 2 weeks ago. This is on the left medial ankle. Dimensions are better 11/6; using Hydrofera Blue. I think the wound is smaller but still not closed. Left medial ankle 11/13; we have been using Hydrofera Blue. Wound is certainly no smaller this week. Also the surface not as good. This is the remanent of a very large area on her left medial ankle. 11/20; using Sorbact since last week. Wound was about the same in terms of size although I was disappointed about the surface debris 12/11; 3-week follow-up. Patient was on vacation. Wound is measuring slightly larger we have  been using Sorbact. 12/18;  wound is about the same size however surface looks better last week after debridement. We have been using Sorbact under compression 1/15 wound is probably twice the size of last time increased in length nonviable surface. We have been using Sorbact. She was running a mild fever and missed her appointment last week 1/22; the wound is come down in size but under illumination still a very adherent debris we have been Hydrofera Blue that I changed her to last week 1/29; dimensions down slightly. We have been using Hydrofera Blue 2/19 dimensions are the same however there is rims of epithelialization under illumination. Therefore more the surface area may be epithelialized 2/26; the patient's wound actually measures smaller. The wound looks healthy. We have been using Hydrofera Blue. I had some thoughts about running Apligraf then I still may do that however this looks so much better this week we will delay that for now 3/5; the wound is small but about the same as last week. We have been using Hydrofera Blue. No debridement is required today. 3/19; the wound is about the size of a dime. Healthy looking wound even under illumination. We have been using Hydrofera Blue. No mechanical debridement is necessary 3/26; not much change from last week although still looks very healthy. We have been using Hydrofera Blue under Unna boots Patient was offered an ankle fusion by podiatry but not until the wound heals with a proceed with this. 4/9; the patient comes in today with her original wound on the medial ankle looking satisfactory however she has some uncontrolled swelling in the middle part of her leg with 2 new open areas superiorly just lateral to the tibia. I think this was probably a wrap issue. She said she felt uncomfortable during the week but did not call in. We have been using Hydrofera Blue 4/16; the wound on the medial ankle is about the same. She has innumerable small  areas superior to this across her mid tibia. I think this is probably folliculitis. She is also been working in the yard doing a lot of sweating 4/30; the patient issue on the upper areas across her mid tibia of all healed. I think this was excessive yard work if I remember. Her wound on the medial ankle is smaller. Some debris on this we have been using Hydrofera Blue under Unna boots 5/7; mid tibia. She has been using Hydrofera Blue under an Unna wrap. She is apparently going for her ankle surgery on June 3 10/28/19-Patient returns to clinic with the ankle wound, we are using Hydrofera Blue under Unna wrap, surgery is scheduled for her left foot for June 3 so she will be back for nurse visit next week READMISSION 01/17/2020 Mrs. Casad is a 65 year old woman we have had in this clinic for a long period of time with severe venous hypertension and refractory wounds on her medial lower legs and ankles bilaterally. 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 When she was off her legs for a prolonged period example when she fell and suffered a compression fracture things would heal up quite nicely. She is now retired and we managed to heal up the right medial leg wound. The left one was very tiny last time I saw this although still refractory. 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 It got to the point where this was interfering with her walking and she elected to proceed with a ankle arthrodesis to straighten her her ankle and leave her with a functional outcome for mobilization. The patient was referred to Dr. Doren Custard and really this took some time to arrange. Dr. Doren Custard saw her on 12/07/2019. Once again he verified that she had no arterial issues. She had previously had an angiogram several  years ago. Follow-up ABIs on the left showed an ABI of 1.12 with triphasic waveforms and a TBI of 0.92. She is felt to have chronic deep venous insufficiency but I do not think it was felt that anything could be done from about this from an ablation point of view. At the time Dr. Doren Custard saw this patient the wounds actually look closed via the pictures in his clinic. The patient finally underwent her surgery on 12/15/2019. This went reasonably well and there was a good anatomic outcome. She developed a small distal wound dehiscence on the lateral part of the surgical wound. However more problematically she is developed recurrence of the wound on the medial left ankle. There are actually 2 wounds here one in the distal lower leg and 1 pretty much at the level of the medial malleolus. It is a more distal area that is more problematic. She has been using Hydrofera Blue which started on Friday before this she was simply Ace wrapping. There was a culture done that showed Pseudomonas and she is on ciprofloxacin. A recent CNS on 8/11 was negative. The patient reports some pain but I generally think this is improving. She is using a cam boot completely nonweightbearing using a walker for pivot transfers and a wheelchair 8/24; not much improvement unfortunately she has a surgical wound on the lateral part in the venous insufficiency wound medially. The bottom part of the medial insufficiency wound is still necrotic there is exposed tendon here. We have been using Hydrofera Blue under compression. Her edema control is however better 8/31; patient in for follow-up of his surgical wound on the lateral part of her left leg and chronic venous insufficiency ulcers medially. We put her back in compression last week. She comes in today with a complaint of 3 or 4 days worth of increasing pain. She felt her cam walker was rubbing on the area on the back of her heel. However there is intense erythema seems more likely she  has cellulitis. She had 2 cultures done when she was seeing podiatry in the postop. One of them in late July showed Pseudomonas and she received a course of ciprofloxacin the other was negative on 8/11 she is allergic to penicillin with anaphylactoid complaints of hives oral swelling via information in epic 9/9; when I saw this patient last week she had intense anterior erythema around her wound on the right lateral heel and ankle and also into the right medial heel. Some of this was no doubt drainage and her walker boot however I was convinced she had cellulitis. I gave her Levaquin and Bactrim she is finishing up on this now. She is following up with Dr. [name] he saw her yesterday. He is taken her out of the walking boot of course she is still nonweightbearing. Her x-ray was negative for any worrisome features such as soft tissue air etc. Things are a lot better this week. She has home health. We have been using Hydrofera Blue under an [employer] which she put back on yesterday. I did  not wrap her last week 9/17; her surrounding skin looks a lot better. In fact the area on the left lateral ankle has just a scant amount of eschar. The only remaining wound is the large area on the left medial ankle. Probably about 60% of this is healthy granulation at the surface however she has a significant divot distally. This has adherent debris in it. I been using debridement and silver collagen to try and get this area to fill-in although I do not think we have made much progress this week 9/24; the patient's wound on the left medial ankle looks a lot better. The deeper divot area distally still requires debridement but this is cleaning up quite nicely we have been using silver collagen. The patient is complaining of swelling in her foot and is worried that that is contributing to the nonhealing of the ankle wound. She is also complaining of numbness in her anterior toes 10/4; left medial ankle. The small area  distally still has a divot with necrotic material that I have been debriding away. This has an undermining area. She is approved for Apligraf. She saw Dr. [name] her surgeon on 10/1. I think he declared himself is satisfied with the condition of things. Still nonweightbearing till the end of the month. 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 Her surgical wound is well closed. There is no evidence of infection 10/11; the patient arrived in clinic today with the expectation that we be able to put an Apligraf on this area after debridement however she arrives with a relatively large amount of green drainage on the dressing. The patient states that this started on Friday. She has not been systemically unwell. 10/19; culture I did last week showed both Enterococcus and Pseudomonas. I think this came in separate parts because I stopped her ciprofloxacin I gave her and prescribed her linezolid however now looking at the final culture result this is Pseudomonas which is resistant to quinolones. She has not yet picked up the linezolid apparently phone issues. We are also trying to get a topical antibiotic out of [place] in [place] they can be applied by home health. She is still having green drainage 10/16; the patient has her topical antibiotic from [facility] in [place]. This is a compounded gel with vancomycin and ciprofloxacin and gentamicin. We are applying this on the wound bed with silver alginate over the top with Unna boot wraps. She arrives in clinic today with a lot less ominous looking drainage although she is only use this topical preparation once the second time today. She sees Dr. [name] her surgeon on Friday she has home health changing the dressing 11/2; still using her compounded topical antibiotic under silver alginate. Surface is cleaning up there is less drainage. We had an Apligraf for her today and I elected to apply it. A light coating of  her antibiotic 04/25/2020 upon evaluation today patient appears to be doing well with regard to her ankle ulcer. There is a little bit of slough noted on the surface of the wound I am can have to perform sharp debridement to clear this away today. With that being said other than that fact overall I feel like she is making progress and we do see some new epithelial growth. There is also some improvement in the depth of the wound and that distal portion. There is little bit of slough there as well. 12/7; 2-week follow-up. Apligraf #3. Dimensions are smaller. Closing in  especially inferiorly. Still some surface debris. Still using the [facility] topical antibiotic but I told her that I don't think this needs to be renewed 12/21; 2-week follow-up. Apligraf #4 dimensions are smaller. Nice improvement 06/05/2020; 2-week follow-up. The patient's wound on the left medial ankle looks really excellent. Nice granulation. Advancing epithelialization no undermining no evidence of infection. We would have to reapply for another Apligraf but with the condition of this wound I did not feel strongly about it. We used Hydrofera Blue under the same degree of compression. She follows up with Dr. [name] her surgeon a week Friday Electronic Signature(s) Signed: 06/05/2020 4:50:25 PM By: Linton Ham MD Entered By: Linton Ham on 06/05/2020 11:03:19 -------------------------------------------------------------------------------- Physical Exam Details Patient Name: Date of Service: [name], [name] 06/05/2020 9:45 A M Medical Record Number: [id number] Patient Account Number: [id number] Date of Birth/Sex: Treating RN: 1955/07/17 (65 y.o. [name] Primary Care Provider: [name] Other Clinician: Referring Provider: Treating Provider/Extender: [name] in Treatment: 20 Constitutional Sitting or standing Blood Pressure is within target range for patient.. Pulse regular and  within target range for patient.Marland Kitchen Respirations regular, non-labored and within target range.. Temperature is normal and within the target range for the patient.Marland Kitchen Appears in no distress. Respiratory work of breathing is normal. Cardiovascular Pedal pulses are palpable. Notes Wound exam; dimensions are smaller. Very healthy looking granulation advancing epithelialization especially from the superior aspect of the wound. The original large divot distally is fully epithelialized and although I do not expect that to get completely back to normal it represents little more than a cosmetic deficit Electronic Signature(s) Signed: 06/05/2020 4:50:25 PM By: Linton Ham MD Entered By: Linton Ham on 06/05/2020 11:04:54 -------------------------------------------------------------------------------- Physician Orders Details Patient Name: Date of Service: [name], [name] 06/05/2020 9:45 A M Medical Record Number: [id number] Patient Account Number: [id number] Date of Birth/Sex: Treating RN: September 01, 1955 (65 y.o. [name] Primary Care Provider: [name] Other Clinician: Referring Provider: Treating Provider/Extender: [name] in Treatment: 20 Verbal / Phone Orders: No Diagnosis Coding Follow-up Appointments Return Appointment in 1 week. Cellular or Tissue Based Products Cellular or Tissue Based Product Type: - Re-run for apligraf Bathing/ Shower/ Hygiene May shower with protection but do not get wound dressing(s) wet. Edema Control - Lymphedema / SCD / Other Elevate legs to the level of the heart or above for 30 minutes daily and/or when sitting, a frequency of: Avoid standing for long periods of time. Exercise regularly Home Health No change in wound care orders this week; continue Home Health for wound care. May utilize formulary equivalent dressing for wound treatment orders unless otherwise specified. Discontinue home health for wound  care. - Pt. d/c'd home health. Wound Treatment Wound #15 - Malleolus Wound Laterality: Left, Medial Cleanser: Soap and Water 1 x Per Week/15 Days Discharge Instructions: May shower and wash wound with dial antibacterial soap and water prior to dressing change. Peri-Wound Care: Triamcinolone 15 (g) 1 x Per Week/15 Days Discharge Instructions: Use triamcinolone 15 (g) as directed Peri-Wound Care: Sween Lotion (Moisturizing lotion) 1 x Per Week/15 Days Discharge Instructions: Apply moisturizing lotion as directed Prim Dressing: Hydrofera Blue Classic Foam, 2x2 in 1 x Per Week/15 Days ary Discharge Instructions: Moisten with saline prior to applying to wound bed. Use hydraferablue until we re-run for Apligraf. Secondary Dressing: Woven Gauze Sponge, Non-Sterile 4x4 in (Home Health) 1 x Per Week/15 Days Discharge Instructions: Apply over primary dressing as directed.  Secondary Dressing: ABD Pad, 5x9 (Home Health) 1 x Per Week/15 Days Discharge Instructions: Apply over primary dressing as directed. Compression Wrap: Unnaboot w/Calamine, 4x10 (in/yd) (Home Health) 1 x Per Week/15 Days Discharge Instructions: Apply Unnaboot as directed. Electronic Signature(s) Signed: 06/05/2020 4:50:25 PM By: Linton Ham MD Signed: 06/05/2020 5:38:12 PM By: [name] RN Entered By: [name] on 06/05/2020 10:45:39 -------------------------------------------------------------------------------- Problem List Details Patient Name: Date of Service: [name], [name] 06/05/2020 9:45 A M Medical Record Number: [id number] Patient Account Number: [id number] Date of Birth/Sex: Treating RN: 10-27-1955 (65 y.o. [name], [name] Primary Care Provider: [name] Other Clinician: Referring Provider: Treating Provider/Extender: [name] in Treatment: 20 Active Problems ICD-10 Encounter Code Description Active Date MDM Diagnosis I87.332 Chronic venous hypertension  (idiopathic) with ulcer and inflammation of left 01/17/2020 No Yes lower extremity L97.828 Non-pressure chronic ulcer of other part of left lower leg with other specified 01/17/2020 No Yes severity L97.328 Non-pressure chronic ulcer of left ankle with other specified severity 01/17/2020 No Yes Inactive Problems ICD-10 Code Description Active Date Inactive Date L03.116 Cellulitis of left lower limb 01/31/2020 01/31/2020 T81.31XD Disruption of external operation (surgical) wound, not elsewhere classified, subsequent 01/17/2020 01/17/2020 encounter Resolved Problems Electronic Signature(s) Signed: 06/05/2020 4:50:25 PM By: Linton Ham MD Entered By: Linton Ham on 06/05/2020 11:02:12 -------------------------------------------------------------------------------- Progress Note Details Patient Name: Date of Service: [name], [name] 06/05/2020 9:45 A M Medical Record Number: [id number] Patient Account Number: [id number] Date of Birth/Sex: Treating RN: [birth year] (65 y.o. [name] Primary Care Provider: [name] Other Clinician: Referring Provider: Treating Provider/Extender: [name] in Treatment: 20 Subjective History of Present Illness (HPI) the remaining wound is over the left medial ankle. Similar wound over the right medial ankle healed largely with use of Apligraf. Most recently we have been using Hydrofera Blue over this wound with considerable improvement. The patient has been extensively worked up in the past for her venous insufficiency and she is not a candidate for antireflux surgery although I have none of the details available currently. 08/24/14; considerable improvement today. About 50% of this wound areas now epithelialized. The base of the wound appears to be healthier granulation.as opposed to last week when she had deteriorated a considerable improvement 08/17/14; unfortunately the wound has regressed somewhat. The areas of  epithelialization from the superior aspect are not nearly as healthy as they were last week. The patient thinks her Hydrofera Blue slipped. 09/07/14; unfortunately the area has markedly regressed in the 2 weeks since I've seen this. There is an odor surrounding erythema. The healthy granulation tissue that we had at the base of the wound now is a dusky color. The nurse reports green drainage 09/14/14; the area looks somewhat better than last week. There is less erythema and less drainage. The culture I did did not show any growth. Nevertheless I think it is better to continue the Cipro and doxycycline for a further week. The remaining wound area was debridement. 09/21/14. Wound did not require debridement last week. Still less erythema and less drainage. She can complete her antibiotics. The areas of epithelialization in the superior aspect of the wound do not look as healthy as they did some weeks ago 10/05/14 continued improvement in the condition of this wound. There is advancing epithelialization. Less aggressive debridement required 10/19/14 continued improvement in the condition and volume of this wound. Less aggressive debridement to the inferior part of this to remove surface slough  and fibrinous eschar 11/02/14 no debridement is required. The surface granulation appears healthy although some of her islands of epithelialization seem to have regressed. No evidence of infection 11/16/14; lites surface debridement done of surface eschar. The wound does not look to be unhealthy. No evidence of infection. Unfortunately the patient has had podiatry issues in the right foot and for some reason has redeveloped small surface ulcerations in the medial right ankle. Her original presentation involved wounds in this area 11/23/14 no debridement. The area on the right ankle has enlarged. The left ankle wound appears stable in terms of the surface although there is periwound inflammation. There has been regression in  the amount of new skin 11/30/14 no debridement. Both wound areas appear healthy. There was no evidence of infection. The the new area on the right medial ankle has enlarged although that both the surfaces appear to be stable. 12/07/14; Debridement of the right medial ankle wound. No no debridement was done on the left. 12/14/14 no major change in and now bilateral medial ankle wounds. Both of these are very painful but the no overt evidence of infection. She has had previous venous ablation 12/21/14; patient states that her right medial ankle wound is considerably more painful last week than usual. Her left is also somewhat painful. She could not tolerate debridement. The right medial ankle wound has fibrinous surface eschar 12/28/14 this is a patient with severe bilateral venous insufficiency ulcers. For a considerable period of time we actually had the one on the right medial ankle healed however this recently opened up again in June. The left medial ankle wound has been a refractory area with some absent flows. We had some success with Hydrofera Blue on this area and it literally closed by 50% however it is recently opened up Foley. Both of these were debridement today of surface eschar. She tolerates this poorly 01/25/15: No change in the status of this. Thick adherent escar. Very poor tolerance of any attempt at debridement. I had healed the right medial malleolus wound for a considerable amount of time and had the left one down to about 50% of the volume although this is totally regressed over the last 48 weeks. Further the right leg has reopened. she is trying to make a appointment with pain and vascular, previous ablations with Dr. Aleda Grana. I do not believe there is an arterial insufficiency issue here 02/01/15 the status of the adherent eschar bilaterally is actually improved. No debridement was done. She did not manage to get vascular studies done 02/08/15 continued debridement of the area was  done today. The slough is less adherent and comes off with less pressure. There is no surrounding infection peripheral pulses are intact 02/15/15 selective debridement with a disposable curette. Again the slough is less adherent and comes off with less difficulty. No surrounding infection peripheral pulses are intact. 02/22/15 selective debridement of the right medial ankle wound. Slough comes off with less difficulty. No obvious surrounding infection peripheral pulses are intact I did not debridement the one on the left. Both of these are stable to improved 03/01/15 selective debridement of both wound areas using a curette to. Adherent slough cup soft with less difficulty. No obvious surrounding infection. The patient tells me that 2 days ago she noted a rash above the right leg wrap. She did not have this on her lower legs when she change this over she arrives with widespread left greater than right almost folliculitis-looking rash which is extremely pruritic. I don't see  anything to culture here. There is no rash on the rest of her body. She feels well systemically. 03/08/15; selective debridement of both wounds using a curette. Base of this does not look unhealthy. She had limegreen drainage coming out of the left leg wound and describes a lot of drainage. The rash on her left leg looks improved to. No cultures were done. 03/22/15; patient was not here last week. Basal wounds does not look healthy and there is no surrounding erythema. No drainage. There is still a rash on the left leg that almost looks vasculitic however it is clearly limited to the top of where the wrap would be. 04/05/15; on the right required a surgical debridement of surface eschar and necrotic subcutaneous tissue. I did not debridement the area on the left. These continue to be large open wounds that are not changing that much. We were successful at one point in healing the area on the right, and at the same time the area on the  left was roughly half the size of current measurements. I think a lot of the deterioration has to do with the prolonged time the patient is on her feet at work 04/19/15 I attempted-like surface debridement bilaterally she does not tolerate this. She tells me that she was in allergic care yesterday with extreme pain over her left lateral malleolus/ankle and was told that she has an "sprain" 05/03/15; large bilateral venous insufficiency wounds over the medial malleolus/medial aspect of her ankles. She complains of copious amounts of drainage and his usual large amounts of pain. There is some increasing erythema around the wound on the right extending into the medial aspect of her foot to. historically she came in with these wounds the right one healed and the left one came down to roughly half its current size however the right one is reopened and the left is expanded. 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 05/10/15 large bilateral venous insufficiency wounds. There is less adherence surface left however the surface culture that I did last week grew pseudomonas therefore bilateral selective debridement score necessary. There is surrounding erythema. The patient describes severe bilateral drainage and a lot of pain in the left ankle. Apparently her podiatrist was were ready to do a cortisone shot 05/17/15; the patient complains of pain and again copious amounts of drainage. 05/24/15; we used Iodo flex last week. Patient notes considerable improvement in wound drainage. Only needed to change this once. 05/31/15; we continued Iodoflex; the base of these large wounds bilaterally is not too bad but there is probably likely a significant bioburden here. I would like to debridement just doesn't tolerate it. 06/06/14 I would like to continue the Iodoflex although she still hasn't managed to obtain supplies. She has bilateral medial malleoli or large wounds  which are mostly superficial. Both of them are covered circumferentially with some nonviable fibrinous slough although she tolerates debridement very poorly. She apparently has an appointment for an ablation on the right leg by interventional radiology. 06/14/15; the patient arrives with the wounds and static condition. We attempted a debridement although she does not do well with this secondary to pain. I 07/05/15; wounds are not much smaller however there appears to be a cleaner granulating base. The left has tight fibrinous slough greater than the right. Debridement is tolerated poorly due to pain. Iodoflex is done more for these wounds in any of the multitude of different  dressings I have tried on the left 1 and then subsequently the right. 07/12/15; no change in the condition of this wound. I am able to do an aggressive debridement on the right but not the left. She simply cannot tolerate it. We have been using Iodoflex which helps somewhat. It is worthwhile remembering that at one point we healed the right medial ankle wound and the left was about 25% of the current circumference. We have suggested returning to vascular surgery for review of possible further ablations for one reason or another she has not been able to do this. 07/26/15 no major change in the condition of either wound on her medial ankle. I did not attempt to debridement of these. She has been aggressively scrubbing these while she is in the shower at home. She has her supply of Iodoflex which seems to have done more for these wounds then anything I have put on recently. 08/09/15 wound area appears larger although not verified by measurements. Using Iodoflex 09/05/2015 -- she was here for avisit today but had significant problems with the wound and I was asked to see her for a physician opinion. I have summarize that this lady has had surgery on her left lower extremity about 10 years ago where the possible veins stripping was done. She  has had an opinion from interventional radiology around November 2016 where no further sclerotherapy was ordered. The patient works 12 hours a day and stands on a concrete floor with work boots and is unable to get the proper compression she requires and cannot elevate her limbs appropriately at any given time. She has recently grown Pseudomonas from her wound culture but has not started her ciprofloxacin which was called in for her. 09/13/15 this continues to be a difficult situation for this patient. At one point I had this wound down to a 1.5 x 1.5" wound on her left leg. This is deteriorated and the right leg has reopened. She now has substantial wounds on her medial calcaneus, malleoli and into her lower leg. One on the left has surface eschar but these are far too painful for me to debridement here. She has a vascular surgery appointment next week to see if anything can be done to help here. I think she has had previous ablations several years ago at [facility]. She has no major edema. She tells me that she did not get product last time [facility]] and went for several days without it. She continues to work in work boots 12 hours a day. She cannot get compression/4-layer under her work boots. 09/20/15 no major change. Periwound edema control was not very good. Her point with pain and vascular is next Wednesday the 25th 09/28/15; the patient is seen vascular surgery and is apparently scheduled for repeat duplex ultrasounds of her bilateral lower legs next week. 10/05/15; the patient was seen by Dr. Doren Custard of vascular surgery. He feels that she should have arterial insufficiency excluded as cause/contributed to her nonhealing stage she is therefore booked for an arteriogram. She has apparently monophasic signals in the dorsalis pedis pulses. She also of course has known severe chronic venous insufficiency with previous procedures as noted previously. I had another long discussion with the patient  today about her continuing to work 12 hour shifts. I've written her out for 2 months area had concerns about this as her work location is currently undergoing significant turmoil and this may lead to her termination. She is aware of this however I agree  with her that she simply cannot continue to stand for 12 hours multiple days a week with the substantial wound areas she has. 10/19/15; the Dr. Doren Custard appointment was largely for an arteriogram which was normal. She does not have an arterial issue. He didn't make a comment about her chronic venous insufficiency for which she has had previous ablations. Presumably it was not felt that anything additional could be done. The patient is now out of work as I prescribed 2 weeks ago. Her wounds look somewhat less aggravated presumably because of this. I felt I would give debridement another try today 10/25/15; no major change in this patient's wounds. We are struggling to get her product that she can afford into her own home through her insurance. 11/01/15; no major change in the patient's wounds. I have been using silver alginate as the most affordable product. I spoke to Dr. Marla Roe last week with her requested take her to the OR for surgical debridement and placement of ACEL. Dr. Marla Roe told me that she would be willing to do this however [facility] will not cover this, fortunately the patient has [place] healthcare of some variant 11/08/15; no major change in the patient's wounds. She has been completely nonviable surface that this but is in too much pain with any attempted debridement are clinic. I have arranged for her to see Dr. Marla Roe ham of plastic surgery and this appointment is on Monday. I am hopeful that they will take her to the OR for debridement, possible ACEL ultimately possible skin graft 11/22/15 no major change in the patient's wounds over her bilateral medial calcaneus medial malleolus into the lower legs. Surface on these  does not look too bad however on the left there is surrounding erythema and tenderness. This may be cellulitis or could him sleepy tinea. 11/29/15; no major changes in the patient's wounds over her bilateral medial malleolus. There is no infection here and I don't think any additional antibiotics are necessary. There is now plan to move forward. She sees Dr. Marla Roe in a week's time for preparation for operative debridement and ACEL placement I believe on 7/12. She then has a follow-up appointment with Dr. Marla Roe on 7/21 12/28/15; the patient returns today having been taken to the [facility] by Dr. Marla Roe 12/12/15 she underwent debridement, intraoperative cultures [which were negative]. She had placement of a wound VAC. Parent really ACEL was not available to be placed. The wound VAC foam apparently adhered to the wound since then she's been using silver alginate, Xeroform under Ace wraps. She still says there is a lot of drainage and a lot of pain 01/31/16; this is a patient I see monthly. I had referred her to Dr. Marla Roe him of plastic surgery for large wounds on her bilateral medial ankles. She has been to the OR twice once in early July and once in early August. She tells me over the last 3 weeks she has been using the wound VAC with ACEL underneath it. On the right we've simply been using silver alginate. Under Kerlix Coban wraps. 02/28/16; this is a patient I'm currently seeing monthly. She is gone on to have a skin graft over her large venous insufficiency ulcer on the left medial ankle. This was done by Dr. Marla Roe him. The patient is a bit perturbed about why she didn't have one on her right medial ankle wound. She has been using silver alginate to this. 03/06/16; I received a phone call from her plastic surgery Dr. Marla Roe.  She expressed some concern about the viability of the skin graft she did on the left medial ankle wound. Asked me to place Endoform on this. She told me she is not  planning to do a subsequent skin graft on the right as the left one did not take very well. I had placed Hydrofera Blue on the right 03/13/16; continue to have a reasonably healthy wound on the right medial ankle. Down to 3 mm in terms of size. There is epithelialization here. The area on the left medial ankle is her skin graft site. I suppose the last week this looks somewhat better. She has an open area inferiorly however in the center there appears to be some viable tissue. There is a lot of surface callus and eschar that will eventually need to come off however none of this looked to be infected. Patient states that the is able to keep the dressing on for several days which is an improvement. 03/20/16 no major change in the circumference of either wound however on the left side the patient was at Dr. Eusebio Friendly office and they did a debridement of left wound. 50% of the wound seems to be epithelialized. I been using Endoform on the left Hydrofera Blue in the right 03/27/16; she arrives today with her wound is not looking as healthy as they did last week. The area on the right clearly has an adherent surface to this a very similar surface on the left. Unfortunately for this patient this is all too familiar problem. Clearly the Endoform is not working and will need to change that today that has some potential to help this surface. She does not tolerate debridement in this clinic very well. She is changing the dressing wants 04/03/16; patient arrives with the wounds looking somewhat better especially on the right. Dr. Migdalia Dk change the dressing to silver alginate when she saw her on Monday and also sold her some compression socks. The usefulness of the latter is really not clear and woman with severely draining wounds. 04/10/16; the patient is doing a bit of an experiment wearing the compression stockings that Dr. Migdalia Dk provided her to her left leg and the out of legs based dressings that we provided  to the right. 05/01/16; the patient is continuing to wear compression stockings Dr. Migdalia Dk provided her on the left that are apparently silver impregnated. She has been using Iodoflex to the right leg wound. Still a moderate amount of drainage, when she leaves here the wraps only last for 4 days. She has to change the stocking on the left leg every night 05/15/16; she is now using compression stockings bilaterally provided by Dr. Marla Roe. She is wearing a nonadherent layer over the wounds so really I don't think there is anything specific being done to this now. She has some reduction on the left wound. The right is stable. I think all healing here is being done without a specific dressing 06/09/16; patient arrives here today with not much change in the wound certainly in diameter to large circular wounds over the medial aspect of her ankle bilaterally. Under the light of these services are certainly not viable for healing. There is no evidence of surrounding infection. She is wearing compression stockings with some sort of silver impregnation as prescribed by Dr. Marla Roe. She has a follow-up with her tomorrow. 06/30/16; no major change in the size or condition of her wounds. These are still probably covered with a nonviable surface. She is using only her purchase stockings.  She did see Dr. Marla Roe who seemed to want to apply Dakin's solution to this I'm not extreme short what value this would be. I would suggest Iodoflex which she still has at home. 07/28/16; I follow Mrs. Orne episodically along with Dr. Marla Roe. 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 She has been applying a topical collagen ointment to both wounds with Adaptic. I don't think Dr. Marla Roe is planning to take her back to the OR. 08/19/16; I follow Mrs. Jeneen Rinks on a monthly basis along with Dr. Marla Roe of plastic surgery. She has very refractory venous  insufficiency wounds on the bilateral medial lower legs left greater than right. I been following her for a number of years. At one point I was able to get the right medial malleolus wound to heal and had the left medial malleolus down to about half its current size however and I had to send her to plastic surgery for an operative debridement. Since then things have been stable to slightly improve the area on the right is slightly better one in the left about the same although there is much less adherent surface than I'm used to with this patient. She is using some form of liquid collagen gel that Dr. Marla Roe provided a Kerlix cover with the patient's own pressure stockings. She tells me that she has extreme pain in both ankles and along the lateral aspect of both feet. She has been unable to work for some period of time. She is telling me she is retiring at the beginning of April. She sees Dr. Doran Durand of orthopedics next week 09/22/16; patient has not seen Dr. Marla Roe since the last time she is here. I'm not really sure what she is using to the wounds other than bits and pieces of think she had left over including most recently Hydrofera Blue. She is using juxtalite stockings. She is having difficulty with her husband's recent illness "stroke". She is having to transport him to various doctors appointments. Dr. Marla Roe left her the option of a repeat debridement with ACEL however she has not been able to get the time to follow-up on this. She continues to have a fair amount of drainage out of these wounds with certainly precludes leaving dressings on all week 10/13/16; patient has not seen Dr. Marla Roe since she was last in our clinic. I'm not really sure what she is doing with the wounds, we did try to get her [facility] and I think she is actually using this most of the time. Because of drainage she states she has to change this every second day although this is an improvement from what  she used to do. She went to see Dr. Doran Durand who did not think she had a muscular issue with regards to her feet, he referred her to a neurologist and I think the appointment is sometime in June. I changed her back to Iodoflex which she has used in the past but not recently. 11/03/16; the patient has been using Iodoflex although she ran out of this. Still claims that there is a lot of drainage although the wound does not look like this. No surrounding erythema. She has not been back to see Dr. Marla Roe 11/24/16; the patient has been using Iodoflex again but she ran out of it 2 or 3 days ago. There is no major change in the condition of either one of these wounds in fact they are larger and covered in a  thick adherent surface slough/nonviable tissue especially on the left. She does not tolerate mechanical debridement in our clinic. Going back to see Dr. Marla Roe of plastic surgery for an operative debridement would seem reasonable. 12/15/16; the patient has not been back to see Dr. Marla Roe. She is been dealing with a series of illnesses and her husband which of monopolized her time. She is been using Sorbact which we largely supplied. She states the drainage is bad enough that it maximum she can go 2-3 days without changing the dressing 01/12/2017 -- the patient has not been back for about 4 weeks and has not seen Dr. Marla Roe not does she have any appointment pending. 01/23/17; patient has not seen Dr. Marla Roe even though I suggested this previously. She is using Santyl that was suggested last week by Dr. Con Memos this Cost her $16 through her insurance which is indeed surprising 02/12/17; continuing Santyl and the patient is changing this daily. A lot of drainage. She has not been back to see plastic surgery she is using an Ace wrap. Our intake nurse suggested wrap around stockings which would make a good reasonable alternative 02/26/17; patient is been using Santyl and changing this daily due to  drainage. She has not been to see plastic surgery she uses in April Ace wrap to control the edema. She did obtain extremitease stockings but stated that the edema in her leg was to big for these 03/20/17; patient is using Santyl and Anasept. Surfaces looked better today the area on the right is actually measuring a little smaller. She has states she has a lot of pain in her feet and ankles and is asking for a consult to pain control which I'll try to help her with through our case manager. 04/10/17; the patient arrives with better-looking wound surfaces and is slightly smaller wound on the left she is using a combination of Santyl and Anasept. She has an appointment or at least as started in the pain control center associated with [facility] 05/14/17; this is a patient who I followed for a prolonged period of time. 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 At one point I had this down to a much smaller wound on the left however these reopened and we've never been able to get these to heal. She has been using Santyl and Anasept gel although 2 weeks ago she ran out of the Anasept gel. She has a stable appearance of the wound. She is going to the wound care clinic at [facility]. They wanted do a nerve block/spinal block although she tells me she is reluctant to go forward with that. 05/21/17; this is a patient I have followed for many years. 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 Chronic pain and deformity in her ankles as well. She is been to see plastic surgery as well as orthopedics. Using PolyMem AG most recently/Kerramax/ABDs and 2 layer compression. She has managed to keep this on and she is coming in for a nurse check to change the dressing on Tuesdays, we see her on Fridays 06/05/17; really quite a good looking surface and the area especially on the right medial has contracted in terms of dimensions. Well granulated  healthy-looking tissue on both sides. Even with an open curet there is nothing that even feels abnormal here. This is as good as I've seen this in quite some time. We have been using PolyMem AG and bringing her in for a nurse check 06/12/17; really quite  good surface on both of these wounds. The right medial has contracted a bit left is not. We've been using PolyMem and AG and she is coming in for a nurse visit 06/19/17; we have been using PolyMem AG and bringing her in for a nurse check. Dimensions of her wounds are not better but the surfaces looked better bilaterally. She complained of bleeding last night and the left wound and increasing pain bilaterally. She states her wound pain is more neuropathic than just the wounds. There was some suggestion that this was radicular from her pain management doctor in talking to her it is really difficult to sort this out. 06/26/17; using PolyMem and AG and bringing her in for a nurse check as All of this and reasonably stable condition. Certainly not improved. The dimensions on the lateral part of the right leg look better but not really measuring better. The medial aspect on the left is about the same. 07/03/16; we have been using PolyMen AG and bringing her in for a nurse check to change the dressings as the wounds have drainage which precludes once weekly changing. We are using all secondary absorptive dressings.our intake nurse is brought up the idea of using a wound VAC/snap VAC on the wound to help with the drainage to see if this would result in some contraction. This is not a bad idea. The area on the right medial is actually looking smaller. Both wounds have a reasonable-looking surface. There is no evidence of cellulitis. The edema is well controlled 07/10/17; the patient was denied for a snap VAC by her insurance. The major issue with these wounds continues to be drainage. We are using wicked PolyMem AG and she is coming in for a nurse visit to change  this. The wounds are stable to slightly improved. The surface looks vibrant and the area on the right certainly has shrunk in size but very slowly 07/17/17; the patient still has large wounds on her bilateral medial malleoli. Surface of both of these wounds looks better. The dimensions seem to come and go but no consistent improvement. There is no epithelialization. We do not have options for advanced treatment products due to insurance issues. They did not approve of the wound VAC to help control the drainage. More recently we've been using PolyMem and AG wicked to allow drainage through. We have been bringing her in for a nurse visit to change this. We do not have a lot of options for wound care products and the home again due to insurance issues 07/24/17; the patient's wound actually looks somewhat better today. No drainage measurements are smaller still healthy-looking surface. We used silver collagen under PolyMen started last week. We have been bringing her in for a dressing change 07/31/17; patient's wound surface continued to look better and I think there is visible change in the dimensions of the wound on the right. Rims of epithelialization. We have been using silver collagen under PolyMen and bringing her in for a dressing change. There appears to be less drainage although she is still in need of the dressing change 08/07/17. Patient's wound surface continues to look better on both sides and the area on the right is definitely smaller. We have been using silver collagen and PolyMen. She feels that the drainage has been it has been better. I asked her about her vascular status. She went to see Dr. Aleda Grana at [facility] and had some form of ablation. I don't have much detail on this. I haven't my  notes from 2016 that she was not a candidate for any further ablation but I don't have any more information on this. We had referred her to vein and vascular I don't think she ever went. He does not  have a history of PAD although I don't have any information on this either. We don't even have ABIs in our record 08/14/17; we've been using silver collagen and PolyMen cover. And putting the patient and compression. She we are bringing her in as a nurse visit to change this because ofarge amount of drainage. We didn't the ABIs in clinic today since they had been done in many moons 1.2 bilaterally. She has been to see vein and vascular however this was at [facility] and she had ablation although I really don't have any information on this all seemed biking get a report. She is also been operatively debrided by plastic surgery and had a cell placed probably 8-12 months ago. This didn't have a major effect. We've been making some gains with current dressings 08/19/17-She is here in follow-up evaluation for bilateral medial malleoli ulcers. She continues to tolerate debridement very poorly. We will continue with recently changed topical treatment; if no significant improvement may consider switching to Iodosorb/Iodoflex. 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 She has been using silver collagen and PolyMem. I believe she has been used and tried on Iodoflex before. 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 These have been very refractory over time. We have been using silver collagen and PolyMen. She is coming in once a week for a doctor's and once a week for nurse visits. We are actually making some progress 09/18/17; the patient's wounds are smaller especially on the right medial. She arrives today to upset to consider even washing these off with Anasept which I think is been part of the reason this is been closing. We've been using collagen covered in PolyMen  otherwise. It is noted that she has a small area of folliculitis on the right medial calf that. As we are wrapping her legs I'll give her a short course of doxycycline to make sure this doesn't amount to anything. She is a long list of complaints today including imbalance, shortness of breath on exertion, inversion of her left ankle. With regards to the latter complaints she is been to see orthopedics and they offered her a tendon release surgery I believe but wanted her wounds to be closed first. I have recommended she go see her primary physician with regards to everything else. 09/25/17; patient's wounds are about the same size. We have made some progress bilaterally although not in recent weeks. She will not allow me T wash these wounds with Anasept even if she is doing her cell. Wheeze we've been using collagen covered in PolyMen. Last week she had a small area of folliculitis this is now opened into a small wound. She completed 5 days of trimethoprim sulfamethoxazole 10/02/17; unfortunately the area on her left medial ankle is worse with a larger wound area towards the Achilles. The patient complains of a lot of pain. She will not allow debridement although visually I don't think there is anything to debridement in any case. We have been using silver collagen and PolyMen for several months now. Initially we are making  some progress although I'm not really seeing that today. We will move back to [facility]. His admittedly this is a bit of a repeat however I'm hoping that his situation is different now. The patient tells me she had her leg on the left give out on her yesterday this is process some pain. 10/09/17; the patient is seen twice a week largely because of drainage issues coming out of the chronic medial bimalleolar wounds that are chronic. Last week the dimensions of the one on the left looks a little larger I changed her to [facility]. She comes in today with a history of terrible  pain in the bilateral wound areas. She will not allow debridement. She will not even allow a tissue culture. There is no surrounding erythema no no evidence of cellulitis. We have been putting her Kerlix Coban man. She will not allow more aggressive compression as there was a suggestion to put her in 3 layer wraps. 10/16/17; large wounds on her bilateral medial malleoli. These are chronic. Not much change from last week. The surface looks have healthy but absolutely no epithelialization. A lot of pain little less so of drainage. She will not allow debridement or even washing these off in the vigorous fashion with Anasept. 10/23/17; large wounds on her bilateral malleoli which are chronic. Some improvement in terms of size perhaps on the right since last time I saw these. She states that after we increased the 3 layer compression there was some bleeding, when she came in for a nurse visit she did not want 3 layer compression put back on about our nurse managed to convince her. She has known chronic venous visit issues and I'm hoping to get her to tolerate the 3 layer compression. using Hydrofera Blue 10/30/17; absolutely no change in the condition of either wound although we've had some improvement in dimensions on the right.. Attempted to put her in 3 layer compression she didn't tolerated she is back in 2 layer compression. We've been using Hydrofera Blue We looked over her past records. She had venous reflux studies in November 2016. There was no evidence of deep venous reflux on the right. Superficial vein did not show the greater saphenous vein at think this is been previously ablated the small saphenous vein was within normal limits. The left deep venous system showed no DVT the vessels were positive for deep venous reflux in the posterior tibial veins at the ankle. The greater saphenous vein was surgically absent small saphenous vein was within normal limits. She went to vein and vascular at  [facility]. I believe she had an ablation on the left greater saphenous vein. I'll update her reflux studies perhaps ever reviewed by vein and vascular. We've made absolutely no progress in these wounds. Will also try to read and TheraSkins through her insurance 11/06/17; W the patient apparently has a 2 week follow-up with vein and vascular I like him to review the whole issue with regards to her previous vascular workup by Dr. Aleda Grana. We've really made no progress on these wounds in many months. She arrives today with less viable looking surface on the left medial ankle wound. This was apparently looking about the same on Tuesday when she was here for nurse visit. 11/13/17; deep tissue culture I did last time of the left lower leg showed multiple organisms without any predominating. In particular no Staphylococcus or group A strep were isolated. We sent her for venous reflux studies. She's had a previous left greater  saphenous vein stripping and I think sclerotherapy of the right greater saphenous vein. She didn't really look at the lesser saphenous vein this both wounds are on the medial aspect. She has reflux in the common femoral vein and popliteal vein and an accessory vein on the right and the common femoral vein and popliteal vein on the left. I'm going to have her go to see vein and vascular just the look over things and see if anything else beside aggressive compression is indicated here. We have not been able to make any progress on these wounds in spite of the fact that the surface of the wounds is never look too bad. 11/20/17; no major change in the condition of the wounds. Patient reports a large amount of drainage. She has a lot of complaints of pain although enlisting her today I wonder if some of this at least his neuropathic rather than secondary to her wounds. She has an appointment with vein and vascular on 12/30/17. The refractory nature of these wounds in my mind at least  need vein and vascular to look over the wounds the recent reflux studies we did and her history to see if anything further can be done here. I also note her gait is deteriorated quite a bit. 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 She has a bilateral Trendelenburg gait. I wonder if this is neuropathic or perhaps multilevel radicular. 11/27/17; her wounds actually looks slightly better. Healthy-looking granulation tissue a scant amount of epithelialization. [place] [employer] will not pay for [employer]. They will play for tri layer Oasis and Dermagraft. This is not a diabetic ulcer. We'll try for the tri layer Oasis. She still complains of some drainage. She has a vein and vascular appointment on 12/30/17 12/04/17; the wounds visually look quite good. Healthy-looking granulation with some degree of epithelialization. We are still waiting for response to our request for trial to try layer Oasis. Her appointment with vascular to review venous and arterial issues isn't sold the end of July 7/31. Not allow debridement or even vigorous cleansing of the wound surface. 12/18/17; slightly smaller especially on the right. 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 We've been using Hydrofera Blue. We still are looking into triple layer Oasis through her insurance 01/08/18 on evaluation today patient's wound actually appears to be showing signs of good improvement at this point in time. She has been tolerating the dressing changes without complication. Fortunately there does not appear to be any evidence of infection at this point in time. We have been utilizing silver nitrate which does seem to be of benefit for her which is also good news. Overall I'm very happy with how things seem to be both regards appearance as well as measurement. Patient did see Dr. Bridgett Larsson for evaluation on 12/30/17. In his assessment he felt that stripping would not likely add much more than chronic  compression to the patient's healing process. His recommendation was to follow-up in three months with Dr. Doren Custard if she hasn't healed in order to consider referral back to you and see vascular where she previously was in a trial and was able to get her wound to heal. I'll be see what she feels she when you staying compression and he reiterated this as well. 01/13/18 on evaluation today patient appears to actually be doing very well in regard to her bilateral medial malleolus ulcers. She seems to have tolerated the chemical cauterization with silver nitrate last week she did have  some pain through that evening but fortunately states that I'll be see since it seems to be doing better she is overall pleased with the progress. 01/21/18; really quite a remarkable improvement since I've last seen these wounds. We started using silver nitrate specially on the islands of hyper granulation which for some reason her around the wound circumference. This is really done quite nicely. Primary dressing Hydrofera Blue under 4 layer compression. She seems to be able to hold out without a nurse rewrap. Follow-up in 1 week 01/28/18; we've continued the hydrofera blue but continued with chemical cauterization to the wound area that we started about a month ago for irregular hyper granulation. She is made almost stunning improvement in the overall wound dimensions. I was not really expecting this degree of improvement in these chronic wounds 02/05/18; we continue with Hydrofera Bluebut of also continued the aggressive chemical cauterization with silver nitrate. We made nice progress with the right greater than left wound. 02/12/18. We continued with Hydrofera Blue after aggressive chemical cauterization with silver nitrate. We appear to be making nice progress with both wound areas 02/19/2018; we continue with [facility] after washing the wounds vigorously with Anasept spray and chemical cauterization with silver nitrate.  We are making excellent progress. The area on the right's just about closed 02/26/2018. The area on the left medial ankle had too much necrotic debris today. I used a #5 curette we are able to get most of the soft. I continued with the silver nitrate to the much smaller wound on the right medial ankle she had a new area on her right lower pretibial area which she says was due to a role in her compression 03/05/2018; both wound areas look healthy. Not much change in dimensions from last week. I continue to use silver nitrate and Hydrofera Blue. The patient saw Dr. Doren Custard of vein and vascular. He felt she had venous stasis ulcers. He felt based on her previous arteriogram she should have adequate circulation for healing. Also she has deep venous reflux but really no significant correctable superficial venous reflux at this time. He felt we should continue with conservative management including leg elevation and compression 04/02/2018; since we last saw this woman about a month ago she had a fall apparently suffered a pelvic fracture. I did not look up the x-ray. Nevertheless because of pain she literally was bedbound for 2 weeks and had home health coming out to change the dressing. Somewhat predictably this is resulted in considerable improvement in both wound areas. The right is just about closed on the medial malleolus and the left is about half the size. 04/16/2018; both her wounds continue to go down in size. Using Hydrofera Blue. 05/07/18; both her wounds appeared to be improving especially on the right where it is almost closed. We are using Hydrofera Blue 05/14/2018; slightly worse this week with larger wounds. Surface on the left medial not quite as good. We have been using Hydrofera Blue 05/21/18; again the wounds are slightly larger. Left medial malleolus slightly larger with eschar around the circumference. We have been using Hydrofera Blue undergoing a wraps for a prolonged period of time. This  got a lot better when she was more recumbent due to a fall and a back injury. I change the primary dressing the silver alginate today. She did not tolerate a 4 layer compression previously although I may need to bring this up with her next time 05/28/2018; area on the left medial malleolus again is slightly  larger with more drainage. Area on the right is roughly unchanged. She has a small area of folliculitis on the right medial just on the lower calf. This does not look ominous. 06/03/2018 left medial malleolus slightly smaller in a better looking surface. We used silver nitrate on this last time with silver alginate. The area on the right appears slightly smaller 1/10; left medial malleolus slightly smaller. Small open area on the right. We used silver nitrate and silver alginate as of 2 weeks ago. We continue with the wound and compression. These got a lot better when she was off her feet 1/17; right medial malleolus wound is smaller. The left may be slightly smaller. Both surfaces look somewhat better. 1/24; both wounds are slightly smaller. Using silver alginate under Unna boots 1/31; both wounds appear smaller in fact the area on the right medial is just about closed. Surface eschar. We have been using silver alginate under Unna boots. The patient is less active now spends let much less time on her feet and I think this is contributed to the general improvement in the wound condition 2/7; both wounds appear smaller. I was hopeful the right medial would be closed however there there is still the same small open area. Slight amount of surface eschar on the left the dimensions are smaller there is eschar but the wound edges appear to be free. We have been using silver alginate under Unna boot's 2/14; both wounds once again measure smaller. Circumferential eschar on the left medial. We have been using silver alginate under Unna boots with gradual improvement 2/21; the area on the right medial  malleolus has healed. The area on the left is smaller. We have been using silver alginate and Unna boots. We can discharge wrapping the right leg she has 20/30 stockings at home she will need to protect the scar tissue in this area 2/28; the area on the right medial malleolus remains closed the patient has a compression stocking. The area on the left is smaller. We have been using silver alginate and Unna boots. 3/6 the area on the right medial ankle remains closed. Good edema control noted she is using her own compression stocking. The area on the left medial ankle is smaller. We have been managing this with silver alginate and Unna boots which we will continue today. 3/13; the area on the right medial ankle remains closed and I'm declaring it healed today. When necessary the left is about the same still a healthy-looking surface but no major change and wound area. No evidence of infection and using silver alginate under unna and generally making considerable improvement 3/27 the area on the right medial ankle remains closed the area on the left is about the same as last week. Certainly not any worse we have been using silver alginate under an Unna boot 4/3; the area on the right medial ankle remains closed per the patient. We did not look at this wound. The wound on the left medial ankle is about the same surface looks healthy we have been using silver alginate under an Unna boot 4/10; area on the right medial ankle remains closed per the patient. We did not look at this wound. The wound on the left medial ankle is slightly larger. The patient complains that the [facility] caused burning pain all week. She also told us that she was a lot more active this week. Changed her back to silver alginate 4/17; right medial ankle still closed per the patient. Left  medial ankle is slightly larger. Using silver alginate. She did not tolerate Hydrofera Blue on this area 4/24; right medial ankle remains  closed we have not look at this. The left medial ankle continues to get larger today by about a centimeter. We have been using silver alginate under Unna boots. She complains about 4 layer compression as an alternative. She has been up on her feet working on her garden 5/8; right medial ankle remains closed we did not look at this. The left medial ankle has increased in size about 100%. We have been using silver alginate under Unna boots. She noted increased pain this week and was not surprised that the wound is deteriorated 5/15; no major change in SA however much less erythema ( one week of doxy ocellulitis). 5/22-65 year old female returns at 1 week to the clinic for left medial ankle wound for which we have been using silver alginate under 3 layer compression She was placed on DOXY at last visit - the wound is wider at this visit. She is in 3 layer compression 5/29; change to [facility] last week. I had given her empiric doxycycline 2 weeks ago for a week. She is in 3 layer compression. She complains of a lot of pain and drainage on presentation today. 6/5; using Hydrofera Blue. I gave her doxycycline recently empirically for erythema and pain around the wound. Believe her cultures showed enterococcus which not would not have been well covered by doxycycline nevertheless the wound looks better and I don't feel specifically that the enterococcus needs to be covered. She has a new what looks like a wrap injury on her lateral left ankle. 6/12; she is using Hydrofera Blue. She has a new area on the left anterior lower tibial area. This was a wrap injury last week. 6/19; the patient is using Hydrofera Blue. She arrived with marked inflammation and erythema around the wound and tenderness. 12/01/18 on evaluation today patient appears to be doing a little bit better based on what I'm hearing from the standpoint of lassos evaluation to this as far as the overall appearance of the wound is concerned.  Then sometime substandard she typically sees Dr. Dellia Nims. Nonetheless overall very pleased with the progress that she's made up to this point. No fevers, chills, nausea, or vomiting noted at this time. 7/10; some improvement in the surface area. Aggressively debrided last week apparently. I went ahead with the debridement today although the patient does not tolerate this very well. We have been using Iodoflex. Still a fair amount of drainage 7/17; slightly smaller. Using Iodoflex. 7/24; no change from last week in terms of surface area. We have been using Iodoflex. Surface looks and continues to look somewhat better 7/31; surface area slightly smaller better looking surface. We have been using Iodoflex. This is under Unna boot compression 8/7-Patient presents at 1 week with Unna boot and Iodoflex, wound appears better 8/14-Patient presents at 1 week with Iodoflex, we use the Unna boot, wound appears to be stable better.Patient is getting Botox treatment for the inversion of the foot for tendon release, Next week 8/21; we are using Iodoflex. Unna boot. The wound is stable in terms of surface area. Under illumination there is some areas of the wound that appear to be either epithelialized or perhaps this is adherent slough at this point I was not really clear. It did not wipe off and I was reluctant to debride this today. 8/28; we are using Iodoflex in an Unna boot. Seems to be  making good improvement. 9/4; using Iodoflex and wound is slightly smaller. 9/18; we are using Iodoflex with topical silver nitrate when she is here. The wound continues to be smaller 10/2; patient missed her appointment last week due to GI issues. She left and Iodoflex based dressing on for 2 weeks. Wound is about the same size about the size of a dime on the left medial lower 10/9 we have been using Iodoflex on the medial left ankle wound. She has a new superficial probable wrap injury on the dorsal left ankle 10/16; we have  been using Hydrofera Blue since last week. This is on the left medial ankle 10/23; we have been using Hydrofera Blue since 2 weeks ago. This is on the left medial ankle. Dimensions are better 11/6; using Hydrofera Blue. I think the wound is smaller but still not closed. Left medial ankle 11/13; we have been using Hydrofera Blue. Wound is certainly no smaller this week. Also the surface not as good. This is the remanent of a very large area on her left medial ankle. 11/20; using Sorbact since last week. Wound was about the same in terms of size although I was disappointed about the surface debris 12/11; 3-week follow-up. Patient was on vacation. Wound is measuring slightly larger we have been using Sorbact. 12/18; wound is about the same size however surface looks better last week after debridement. We have been using Sorbact under compression 1/15 wound is probably twice the size of last time increased in length nonviable surface. We have been using Sorbact. She was running a mild fever and missed her appointment last week 1/22; the wound is come down in size but under illumination still a very adherent debris we have been Hydrofera Blue that I changed her to last week 1/29; dimensions down slightly. We have been using Hydrofera Blue 2/19 dimensions are the same however there is rims of epithelialization under illumination. Therefore more the surface area may be epithelialized 2/26; the patient's wound actually measures smaller. The wound looks healthy. We have been using Hydrofera Blue. I had some thoughts about running Apligraf then I still may do that however this looks so much better this week we will delay that for now 3/5; the wound is small but about the same as last week. We have been using Hydrofera Blue. No debridement is required today. 3/19; the wound is about the size of a dime. Healthy looking wound even under illumination. We have been using Hydrofera Blue. No mechanical  debridement is necessary 3/26; not much change from last week although still looks very healthy. We have been using Hydrofera Blue under Unna boots Patient was offered an ankle fusion by podiatry but not until the wound heals with a proceed with this. 4/9; the patient comes in today with her original wound on the medial ankle looking satisfactory however she has some uncontrolled swelling in the middle part of her leg with 2 new open areas superiorly just lateral to the tibia. I think this was probably a wrap issue. She said she felt uncomfortable during the week but did not call in. We have been using Hydrofera Blue 4/16; the wound on the medial ankle is about the same. She has innumerable small areas superior to this across her mid tibia. I think this is probably folliculitis. She is also been working in the yard doing a lot of sweating 4/30; the patient issue on the upper areas across her mid tibia of all healed. I think this was excessive  yard work if I remember. Her wound on the medial ankle is smaller. Some debris on this we have been using Hydrofera Blue under Unna boots 5/7; mid tibia. She has been using Hydrofera Blue under an Unna wrap. She is apparently going for her ankle surgery on June 3 10/28/19-Patient returns to clinic with the ankle wound, we are using Hydrofera Blue under Unna wrap, surgery is scheduled for her left foot for June 3 so she will be back for nurse visit next week READMISSION 01/17/2020 Mrs. Spink is a 65 year old woman we have had in this clinic for a long period of time with severe venous hypertension and refractory wounds on her medial lower legs and ankles bilaterally. 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 When she was off her legs for a prolonged period example when she fell and suffered a compression fracture things would heal up quite  nicely. She is now retired and we managed to heal up the right medial leg wound. The left one was very tiny last time I saw this although still refractory. 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 It got to the point where this was interfering with her walking and she elected to proceed with a ankle arthrodesis to straighten her her ankle and leave her with a functional outcome for mobilization. The patient was referred to Dr. Doren Custard and really this took some time to arrange. Dr. Doren Custard saw her on 12/07/2019. Once again he verified that she had no arterial issues. She had previously had an angiogram several years ago. Follow-up ABIs on the left showed an ABI of 1.12 with triphasic waveforms and a TBI of 0.92. She is felt to have chronic deep venous insufficiency but I do not think it was felt that anything could be done from about this from an ablation point of view. At the time Dr. Doren Custard saw this patient the wounds actually look closed via the pictures in his clinic. The patient finally underwent her surgery on 12/15/2019. This went reasonably well and there was a good anatomic outcome. She developed a small distal wound dehiscence on the lateral part of the surgical wound. However more problematically she is developed recurrence of the wound on the medial left ankle. There are actually 2 wounds here one in the distal lower leg and 1 pretty much at the level of the medial malleolus. It is a more distal area that is more problematic. She has been using Hydrofera Blue which started on Friday before this she was simply Ace wrapping. There was a culture done that showed Pseudomonas and she is on ciprofloxacin. A recent CNS on 8/11 was negative. The patient reports some pain but I generally think this is improving. She is using a cam boot completely nonweightbearing using a walker for pivot transfers and a  wheelchair 8/24; not much improvement unfortunately she has a surgical wound on the lateral part in the venous insufficiency wound medially. The bottom part of the medial insufficiency wound is still necrotic there is exposed tendon here. We have been using Hydrofera Blue under compression. Her edema control is however better 8/31; patient in for follow-up of his surgical wound on the lateral part of her left leg and chronic venous insufficiency ulcers medially. We put her back in compression last  week. She comes in today with a complaint of 3 or 4 days worth of increasing pain. She felt her cam walker was rubbing on the area on the back of her heel. However there is intense erythema seems more likely she has cellulitis. She had 2 cultures done when she was seeing podiatry in the postop. One of them in late July showed Pseudomonas and she received a course of ciprofloxacin the other was negative on 8/11 she is allergic to penicillin with anaphylactoid complaints of hives oral swelling via information in epic 9/9; when I saw this patient last week she had intense anterior erythema around her wound on the right lateral heel and ankle and also into the right medial heel. Some of this was no doubt drainage and her walker boot however I was convinced she had cellulitis. I gave her Levaquin and Bactrim she is finishing up on this now. She is following up with Dr. [name] he saw her yesterday. He is taken her out of the walking boot of course she is still nonweightbearing. Her x-ray was negative for any worrisome features such as soft tissue air etc. Things are a lot better this week. She has home health. We have been using Hydrofera Blue under an [employer] which she put back on yesterday. I did not wrap her last week 9/17; her surrounding skin looks a lot better. In fact the area on the left lateral ankle has just a scant amount of eschar. The only remaining wound is the large area on the left medial ankle.  Probably about 60% of this is healthy granulation at the surface however she has a significant divot distally. This has adherent debris in it. I been using debridement and silver collagen to try and get this area to fill-in although I do not think we have made much progress this week 9/24; the patient's wound on the left medial ankle looks a lot better. The deeper divot area distally still requires debridement but this is cleaning up quite nicely we have been using silver collagen. The patient is complaining of swelling in her foot and is worried that that is contributing to the nonhealing of the ankle wound. She is also complaining of numbness in her anterior toes 10/4; left medial ankle. The small area distally still has a divot with necrotic material that I have been debriding away. This has an undermining area. She is approved for Apligraf. She saw Dr. [name] her surgeon on 10/1. I think he declared himself is satisfied with the condition of things. Still nonweightbearing till the end of the month. 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 Her surgical wound is well closed. There is no evidence of infection 10/11; the patient arrived in clinic today with the expectation that we be able to put an Apligraf on this area after debridement however she arrives with a relatively large amount of green drainage on the dressing. The patient states that this started on Friday. She has not been systemically unwell. 10/19; culture I did last week showed both Enterococcus and Pseudomonas. I think this came in separate parts because I stopped her ciprofloxacin I gave her and prescribed her linezolid however now looking at the final culture result this is Pseudomonas which is resistant to quinolones. She has not yet picked up the linezolid apparently phone issues. We are also trying to get a topical antibiotic out of [place] in [place] they can be applied by home health. She  is still having green drainage 10/16; the patient has her topical antibiotic from [facility] in [place]. This is a compounded gel with vancomycin and ciprofloxacin and gentamicin. We are applying this on the wound bed with silver alginate over the top with Unna boot wraps. She arrives in clinic today with a lot less ominous looking drainage although she is only use this topical preparation once the second time today. She sees Dr. [name] her surgeon on Friday she has home health changing the dressing 11/2; still using her compounded topical antibiotic under silver alginate. Surface is cleaning up there is less drainage. We had an Apligraf for her today and I elected to apply it. A light coating of her antibiotic 04/25/2020 upon evaluation today patient appears to be doing well with regard to her ankle ulcer. There is a little bit of slough noted on the surface of the wound I am can have to perform sharp debridement to clear this away today. With that being said other than that fact overall I feel like she is making progress and we do see some new epithelial growth. There is also some improvement in the depth of the wound and that distal portion. There is little bit of slough there as well. 12/7; 2-week follow-up. Apligraf #3. Dimensions are smaller. Closing in especially inferiorly. Still some surface debris. Still using the [facility] topical antibiotic but I told her that I don't think this needs to be renewed 12/21; 2-week follow-up. Apligraf #4 dimensions are smaller. Nice improvement 06/05/2020; 2-week follow-up. The patient's wound on the left medial ankle looks really excellent. Nice granulation. Advancing epithelialization no undermining no evidence of infection. We would have to reapply for another Apligraf but with the condition of this wound I did not feel strongly about it. We used Hydrofera Blue under the same degree of compression. She follows up with Dr. [name] her surgeon a week  Friday Objective Constitutional Sitting or standing Blood Pressure is within target range for patient.. Pulse regular and within target range for patient.Marland Kitchen Respirations regular, non-labored and within target range.. Temperature is normal and within the target range for the patient.Marland Kitchen Appears in no distress. Vitals Time Taken: 9:58 AM, Height: 68 in, Temperature: 97.9 F, Pulse: 73 bpm, Respiratory Rate: 16 breaths/min, Blood Pressure: 122/67 mmHg. Respiratory work of breathing is normal. Cardiovascular Pedal pulses are palpable. General Notes: Wound exam; dimensions are smaller. Very healthy looking granulation advancing epithelialization especially from the superior aspect of the wound. The original large divot distally is fully epithelialized and although I do not expect that to get completely back to normal it represents little more than a cosmetic deficit Integumentary (Hair, Skin) Wound #15 status is Open. Original cause of wound was Gradually Appeared. The wound is located on the Left,Medial Malleolus. The wound measures 1.8cm length x 1cm width x 0.1cm depth; 1.414cm^2 area and 0.141cm^3 volume. There is Fat Layer (Subcutaneous Tissue) exposed. There is no tunneling or undermining noted. There is a medium amount of serous drainage noted. The wound margin is flat and intact. There is large (67-100%) red granulation within the wound bed. There is no necrotic tissue within the wound bed. Assessment Active Problems ICD-10 Chronic venous hypertension (idiopathic) with ulcer and inflammation of left lower extremity Non-pressure chronic ulcer of other part of left lower leg with other specified severity Non-pressure chronic ulcer of left ankle with other specified severity Procedures Wound #15 Pre-procedure diagnosis of Wound #15 is a Venous Leg Ulcer located on the Left,Medial Malleolus .  There was a Haematologist Compression Therapy Procedure by [name], RN. Post procedure  Diagnosis Wound #15: Same as Pre-Procedure Plan Follow-up Appointments: Return Appointment in 1 week. Cellular or Tissue Based Products: Cellular or Tissue Based Product Type: - Re-run for apligraf Bathing/ Shower/ Hygiene: May shower with protection but do not get wound dressing(s) wet. Edema Control - Lymphedema / SCD / Other: Elevate legs to the level of the heart or above for 30 minutes daily and/or when sitting, a frequency of: Avoid standing for long periods of time. Exercise regularly Home Health: No change in wound care orders this week; continue Home Health for wound care. May utilize formulary equivalent dressing for wound treatment orders unless otherwise specified. Discontinue home health for wound care. - Pt. d/c'd home health. WOUND #15: - Malleolus Wound Laterality: Left, Medial Cleanser: Soap and Water 1 x Per Week/15 Days Discharge Instructions: May shower and wash wound with dial antibacterial soap and water prior to dressing change. Peri-Wound Care: Triamcinolone 15 (g) 1 x Per Week/15 Days Discharge Instructions: Use triamcinolone 15 (g) as directed Peri-Wound Care: Sween Lotion (Moisturizing lotion) 1 x Per Week/15 Days Discharge Instructions: Apply moisturizing lotion as directed Prim Dressing: Hydrofera Blue Classic Foam, 2x2 in 1 x Per Week/15 Days ary Discharge Instructions: Moisten with saline prior to applying to wound bed. Use hydraferablue until we re-run for Apligraf. Secondary Dressing: Woven Gauze Sponge, Non-Sterile 4x4 in (Home Health) 1 x Per Week/15 Days Discharge Instructions: Apply over primary dressing as directed. Secondary Dressing: ABD Pad, 5x9 (Home Health) 1 x Per Week/15 Days Discharge Instructions: Apply over primary dressing as directed. Com pression Wrap: Unnaboot w/Calamine, 4x10 (in/yd) (Home Health) 1 x Per Week/15 Days Discharge Instructions: Apply Unnaboot as directed. #1 We used Hydrofera Blue/ABDs under 3 layer compression 2. I  did not feel pressed to order another Apligraf at this point. Hopefully this will not be the wrong decision. 3. Follow-up next week Electronic Signature(s) Signed: 06/05/2020 4:50:25 PM By: Linton Ham MD Entered By: Linton Ham on 06/05/2020 11:05:51 -------------------------------------------------------------------------------- SuperBill Details Patient Name: Date of Service: [name], [name] 06/05/2020 Medical Record Number: [id number] Patient Account Number: [id number] Date of Birth/Sex: Treating RN: 09/25/1955 (65 y.o. [name], [name] Primary Care Provider: [name] Other Clinician: Referring Provider: Treating Provider/Extender: [name] in Treatment: 20 Diagnosis Coding ICD-10 Codes Code Description [phone] Chronic venous hypertension (idiopathic) with ulcer and inflammation of left lower extremity L97.828 Non-pressure chronic ulcer of other part of left lower leg with other specified severity L97.328 Non-pressure chronic ulcer of left ankle with other specified severity Facility Procedures CPT4 Code: [id number] ( Description: Facility Use Only) [id number] - APPLY UNNA BOOT LT Modifier: Quantity: 1 Physician Procedures : CPT4 Code Description Modifier [id number] - WC PHYS LEVEL 3 - EST PT ICD-10 Diagnosis Description I87.332 Chronic venous hypertension (idiopathic) with ulcer and inflammation of left lower extremity L97.328 Non-pressure chronic ulcer of left ankle  with other specified severity Quantity: 1 Electronic Signature(s) Signed: 06/05/2020 4:50:25 PM By: Linton Ham MD Entered By: Linton Ham on 06/05/2020 11:06:16

## 2020-06-12 ENCOUNTER — Ambulatory Visit: Payer: Medicare Other | Admitting: Podiatry

## 2020-06-13 ENCOUNTER — Encounter (HOSPITAL_BASED_OUTPATIENT_CLINIC_OR_DEPARTMENT_OTHER): Payer: Medicare Other | Admitting: Physician Assistant

## 2020-06-13 ENCOUNTER — Other Ambulatory Visit: Payer: Self-pay

## 2020-06-13 DIAGNOSIS — I87332 Chronic venous hypertension (idiopathic) with ulcer and inflammation of left lower extremity: Secondary | ICD-10-CM | POA: Diagnosis not present

## 2020-06-13 NOTE — Progress Notes (Addendum)
[name], [name] ([id number]) Visit Report for 06/13/2020 Chief Complaint Document Details Patient Name: Date of Service: [facility] [name], [name] 06/13/2020 1:00 PM Medical Record Number: [id number] Patient Account Number: [id number] Date of Birth/Sex: Treating RN: 04-05-1956 (65 y.o. [name] Primary Care Provider: [name] Other Clinician: Referring Provider: Treating Provider/Extender: [name] in Treatment: 21 Information Obtained from: Patient Chief Complaint patient is been followed long-term in this clinic for venous insufficiency ulcers with inflammation, hypertension and ulceration over the medial ankle bilaterally. 01/17/2020; this is a patient who is here for review of postoperative wounds on the left lateral ankle and recurrence of venous stasis ulceration on the left medial Electronic Signature(s) Signed: 06/13/2020 1:10:12 PM By: [name] PA-C Entered By: [name] on 06/13/2020 13:10:10 -------------------------------------------------------------------------------- HPI Details Patient Name: Date of Service: [name] [name], [name] 06/13/2020 1:00 PM Medical Record Number: [id number] Patient Account Number: [id number] Date of Birth/Sex: Treating RN: February 07, 1956 (65 y.o. [name] Primary Care Provider: [name] Other Clinician: Referring Provider: Treating Provider/Extender: [name] in Treatment: 21 History of Present Illness HPI Description: the remaining wound is over the left medial ankle. Similar wound over the right medial ankle healed largely with use of Apligraf. Most recently we have been using Hydrofera Blue over this wound with considerable improvement. The patient has been extensively worked up in the past for her venous insufficiency and she is not a candidate for antireflux surgery although I have none of the details available currently. 08/24/14; considerable improvement  today. About 50% of this wound areas now epithelialized. The base of the wound appears to be healthier granulation.as opposed to last week when she had deteriorated a considerable improvement 08/17/14; unfortunately the wound has regressed somewhat. The areas of epithelialization from the superior aspect are not nearly as healthy as they were last week. The patient thinks her Hydrofera Blue slipped. 09/07/14; unfortunately the area has markedly regressed in the 2 weeks since I've seen this. There is an odor surrounding erythema. The healthy granulation tissue that we had at the base of the wound now is a dusky color. The nurse reports green drainage 09/14/14; the area looks somewhat better than last week. There is less erythema and less drainage. The culture I did did not show any growth. Nevertheless I think it is better to continue the Cipro and doxycycline for a further week. The remaining wound area was debridement. 09/21/14. Wound did not require debridement last week. Still less erythema and less drainage. She can complete her antibiotics. The areas of epithelialization in the superior aspect of the wound do not look as healthy as they did some weeks ago 10/05/14 continued improvement in the condition of this wound. There is advancing epithelialization. Less aggressive debridement required 10/19/14 continued improvement in the condition and volume of this wound. Less aggressive debridement to the inferior part of this to remove surface slough and fibrinous eschar 11/02/14 no debridement is required. The surface granulation appears healthy although some of her islands of epithelialization seem to have regressed. No evidence of infection 11/16/14; lites surface debridement done of surface eschar. The wound does not look to be unhealthy. No evidence of infection. Unfortunately the patient has had podiatry issues in the right foot and for some reason has redeveloped small surface ulcerations in the medial  right ankle. Her original presentation involved wounds in this area 11/23/14 no debridement. The area on the right ankle  has enlarged. The left ankle wound appears stable in terms of the surface although there is periwound inflammation. There has been regression in the amount of new skin 11/30/14 no debridement. Both wound areas appear healthy. There was no evidence of infection. The the new area on the right medial ankle has enlarged although that both the surfaces appear to be stable. 12/07/14; Debridement of the right medial ankle wound. No no debridement was done on the left. 12/14/14 no major change in and now bilateral medial ankle wounds. Both of these are very painful but the no overt evidence of infection. She has had previous venous ablation 12/21/14; patient states that her right medial ankle wound is considerably more painful last week than usual. Her left is also somewhat painful. She could not tolerate debridement. The right medial ankle wound has fibrinous surface eschar 12/28/14 this is a patient with severe bilateral venous insufficiency ulcers. For a considerable period of time we actually had the one on the right medial ankle healed however this recently opened up again in June. The left medial ankle wound has been a refractory area with some absent flows. We had some success with Hydrofera Blue on this area and it literally closed by 50% however it is recently opened up Foley. Both of these were debridement today of surface eschar. She tolerates this poorly 01/25/15: No change in the status of this. Thick adherent escar. Very poor tolerance of any attempt at debridement. I had healed the right medial malleolus wound for a considerable amount of time and had the left one down to about 50% of the volume although this is totally regressed over the last 48 weeks. Further the right leg has reopened. she is trying to make a appointment with pain and vascular, previous ablations with Dr.  Aleda Grana. I do not believe there is an arterial insufficiency issue here 02/01/15 the status of the adherent eschar bilaterally is actually improved. No debridement was done. She did not manage to get vascular studies done 02/08/15 continued debridement of the area was done today. The slough is less adherent and comes off with less pressure. There is no surrounding infection peripheral pulses are intact 02/15/15 selective debridement with a disposable curette. Again the slough is less adherent and comes off with less difficulty. No surrounding infection peripheral pulses are intact. 02/22/15 selective debridement of the right medial ankle wound. Slough comes off with less difficulty. No obvious surrounding infection peripheral pulses are intact I did not debridement the one on the left. Both of these are stable to improved 03/01/15 selective debridement of both wound areas using a curette to. Adherent slough cup soft with less difficulty. No obvious surrounding infection. The patient tells me that 2 days ago she noted a rash above the right leg wrap. She did not have this on her lower legs when she change this over she arrives with widespread left greater than right almost folliculitis-looking rash which is extremely pruritic. I don't see anything to culture here. There is no rash on the rest of her body. She feels well systemically. 03/08/15; selective debridement of both wounds using a curette. Base of this does not look unhealthy. She had limegreen drainage coming out of the left leg wound and describes a lot of drainage. The rash on her left leg looks improved to. No cultures were done. 03/22/15; patient was not here last week. Basal wounds does not look healthy and there is no surrounding erythema. No drainage. There is still a rash on  the left leg that almost looks vasculitic however it is clearly limited to the top of where the wrap would be. 04/05/15; on the right required a surgical debridement  of surface eschar and necrotic subcutaneous tissue. I did not debridement the area on the left. These continue to be large open wounds that are not changing that much. We were successful at one point in healing the area on the right, and at the same time the area on the left was roughly half the size of current measurements. I think a lot of the deterioration has to do with the prolonged time the patient is on her feet at work 04/19/15 I attempted-like surface debridement bilaterally she does not tolerate this. She tells me that she was in allergic care yesterday with extreme pain over her left lateral malleolus/ankle and was told that she has an "sprain" 05/03/15; large bilateral venous insufficiency wounds over the medial malleolus/medial aspect of her ankles. She complains of copious amounts of drainage and his usual large amounts of pain. There is some increasing erythema around the wound on the right extending into the medial aspect of her foot to. historically she came in with these wounds the right one healed and the left one came down to roughly half its current size however the right one is reopened and the left is expanded. 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 05/10/15 large bilateral venous insufficiency wounds. There is less adherence surface left however the surface culture that I did last week grew pseudomonas therefore bilateral selective debridement score necessary. There is surrounding erythema. The patient describes severe bilateral drainage and a lot of pain in the left ankle. Apparently her podiatrist was were ready to do a cortisone shot 05/17/15; the patient complains of pain and again copious amounts of drainage. 05/24/15; we used Iodo flex last week. Patient notes considerable improvement in wound drainage. Only needed to change this once. 05/31/15; we continued Iodoflex; the base of these large wounds bilaterally is not  too bad but there is probably likely a significant bioburden here. I would like to debridement just doesn't tolerate it. 06/06/14 I would like to continue the Iodoflex although she still hasn't managed to obtain supplies. She has bilateral medial malleoli or large wounds which are mostly superficial. Both of them are covered circumferentially with some nonviable fibrinous slough although she tolerates debridement very poorly. She apparently has an appointment for an ablation on the right leg by interventional radiology. 06/14/15; the patient arrives with the wounds and static condition. We attempted a debridement although she does not do well with this secondary to pain. I 07/05/15; wounds are not much smaller however there appears to be a cleaner granulating base. The left has tight fibrinous slough greater than the right. Debridement is tolerated poorly due to pain. Iodoflex is done more for these wounds in any of the multitude of different dressings I have tried on the left 1 and then subsequently the right. 07/12/15; no change in the condition of this wound. I am able to do an aggressive debridement on the right but not the left. She simply cannot tolerate it. We have been using Iodoflex which helps somewhat. It is worthwhile remembering that at one point we healed the right medial ankle wound and the left was about 25% of the current circumference. We have suggested returning to vascular surgery for review of possible further ablations for one reason  or another she has not been able to do this. 07/26/15 no major change in the condition of either wound on her medial ankle. I did not attempt to debridement of these. She has been aggressively scrubbing these while she is in the shower at home. She has her supply of Iodoflex which seems to have done more for these wounds then anything I have put on recently. 08/09/15 wound area appears larger although not verified by measurements. Using Iodoflex 09/05/2015  -- she was here for avisit today but had significant problems with the wound and I was asked to see her for a physician opinion. I have summarize that this lady has had surgery on her left lower extremity about 10 years ago where the possible veins stripping was done. She has had an opinion from interventional radiology around November 2016 where no further sclerotherapy was ordered. The patient works 12 hours a day and stands on a concrete floor with work boots and is unable to get the proper compression she requires and cannot elevate her limbs appropriately at any given time. She has recently grown Pseudomonas from her wound culture but has not started her ciprofloxacin which was called in for her. 09/13/15 this continues to be a difficult situation for this patient. At one point I had this wound down to a 1.5 x 1.5" wound on her left leg. This is deteriorated and the right leg has reopened. She now has substantial wounds on her medial calcaneus, malleoli and into her lower leg. One on the left has surface eschar but these are far too painful for me to debridement here. She has a vascular surgery appointment next week to see if anything can be done to help here. I think she has had previous ablations several years ago at [facility]. She has no major edema. She tells me that she did not get product last time [facility]] and went for several days without it. She continues to work in work boots 12 hours a day. She cannot get compression/4-layer under her work boots. 09/20/15 no major change. Periwound edema control was not very good. Her point with pain and vascular is next Wednesday the 25th 09/28/15; the patient is seen vascular surgery and is apparently scheduled for repeat duplex ultrasounds of her bilateral lower legs next week. 10/05/15; the patient was seen by Dr. Doren Custard of vascular surgery. He feels that she should have arterial insufficiency excluded as cause/contributed to her nonhealing stage  she is therefore booked for an arteriogram. She has apparently monophasic signals in the dorsalis pedis pulses. She also of course has known severe chronic venous insufficiency with previous procedures as noted previously. I had another long discussion with the patient today about her continuing to work 12 hour shifts. I've written her out for 2 months area had concerns about this as her work location is currently undergoing significant turmoil and this may lead to her termination. She is aware of this however I agree with her that she simply cannot continue to stand for 12 hours multiple days a week with the substantial wound areas she has. 10/19/15; the Dr. Doren Custard appointment was largely for an arteriogram which was normal. She does not have an arterial issue. He didn't make a comment about her chronic venous insufficiency for which she has had previous ablations. Presumably it was not felt that anything additional could be done. The patient is now out of work as I prescribed 2 weeks ago. Her wounds look somewhat less aggravated presumably  because of this. I felt I would give debridement another try today 10/25/15; no major change in this patient's wounds. We are struggling to get her product that she can afford into her own home through her insurance. 11/01/15; no major change in the patient's wounds. I have been using silver alginate as the most affordable product. I spoke to Dr. Marla Roe last week with her requested take her to the OR for surgical debridement and placement of ACEL. Dr. Marla Roe told me that she would be willing to do this however [facility] will not cover this, fortunately the patient has [place] healthcare of some variant 11/08/15; no major change in the patient's wounds. She has been completely nonviable surface that this but is in too much pain with any attempted debridement are clinic. I have arranged for her to see Dr. Marla Roe ham of plastic surgery and this  appointment is on Monday. I am hopeful that they will take her to the OR for debridement, possible ACEL ultimately possible skin graft 11/22/15 no major change in the patient's wounds over her bilateral medial calcaneus medial malleolus into the lower legs. Surface on these does not look too bad however on the left there is surrounding erythema and tenderness. This may be cellulitis or could him sleepy tinea. 11/29/15; no major changes in the patient's wounds over her bilateral medial malleolus. There is no infection here and I don't think any additional antibiotics are necessary. There is now plan to move forward. She sees Dr. Marla Roe in a week's time for preparation for operative debridement and ACEL placement I believe on 7/12. She then has a follow-up appointment with Dr. Marla Roe on 7/21 12/28/15; the patient returns today having been taken to the [facility] by Dr. Marla Roe 12/12/15 she underwent debridement, intraoperative cultures [which were negative]. She had placement of a wound VAC. Parent really ACEL was not available to be placed. The wound VAC foam apparently adhered to the wound since then she's been using silver alginate, Xeroform under Ace wraps. She still says there is a lot of drainage and a lot of pain 01/31/16; this is a patient I see monthly. I had referred her to Dr. Marla Roe him of plastic surgery for large wounds on her bilateral medial ankles. She has been to the OR twice once in early July and once in early August. She tells me over the last 3 weeks she has been using the wound VAC with ACEL underneath it. On the right we've simply been using silver alginate. Under Kerlix Coban wraps. 02/28/16; this is a patient I'm currently seeing monthly. She is gone on to have a skin graft over her large venous insufficiency ulcer on the left medial ankle. This was done by Dr. Marla Roe him. The patient is a bit perturbed about why she didn't have one on her right medial ankle wound. She has  been using silver alginate to this. 03/06/16; I received a phone call from her plastic surgery Dr. Marla Roe. She expressed some concern about the viability of the skin graft she did on the left medial ankle wound. Asked me to place Endoform on this. She told me she is not planning to do a subsequent skin graft on the right as the left one did not take very well. I had placed Hydrofera Blue on the right 03/13/16; continue to have a reasonably healthy wound on the right medial ankle. Down to 3 mm in terms of size. There is epithelialization here. The area on the left medial  ankle is her skin graft site. I suppose the last week this looks somewhat better. She has an open area inferiorly however in the center there appears to be some viable tissue. There is a lot of surface callus and eschar that will eventually need to come off however none of this looked to be infected. Patient states that the is able to keep the dressing on for several days which is an improvement. 03/20/16 no major change in the circumference of either wound however on the left side the patient was at Dr. Eusebio Friendly office and they did a debridement of left wound. 50% of the wound seems to be epithelialized. I been using Endoform on the left Hydrofera Blue in the right 03/27/16; she arrives today with her wound is not looking as healthy as they did last week. The area on the right clearly has an adherent surface to this a very similar surface on the left. Unfortunately for this patient this is all too familiar problem. Clearly the Endoform is not working and will need to change that today that has some potential to help this surface. She does not tolerate debridement in this clinic very well. She is changing the dressing wants 04/03/16; patient arrives with the wounds looking somewhat better especially on the right. Dr. Migdalia Dk change the dressing to silver alginate when she saw her on Monday and also sold her some compression socks.  The usefulness of the latter is really not clear and woman with severely draining wounds. 04/10/16; the patient is doing a bit of an experiment wearing the compression stockings that Dr. Migdalia Dk provided her to her left leg and the out of legs based dressings that we provided to the right. 05/01/16; the patient is continuing to wear compression stockings Dr. Migdalia Dk provided her on the left that are apparently silver impregnated. She has been using Iodoflex to the right leg wound. Still a moderate amount of drainage, when she leaves here the wraps only last for 4 days. She has to change the stocking on the left leg every night 05/15/16; she is now using compression stockings bilaterally provided by Dr. Marla Roe. She is wearing a nonadherent layer over the wounds so really I don't think there is anything specific being done to this now. She has some reduction on the left wound. The right is stable. I think all healing here is being done without a specific dressing 06/09/16; patient arrives here today with not much change in the wound certainly in diameter to large circular wounds over the medial aspect of her ankle bilaterally. Under the light of these services are certainly not viable for healing. There is no evidence of surrounding infection. She is wearing compression stockings with some sort of silver impregnation as prescribed by Dr. Marla Roe. She has a follow-up with her tomorrow. 06/30/16; no major change in the size or condition of her wounds. These are still probably covered with a nonviable surface. She is using only her purchase stockings. She did see Dr. Marla Roe who seemed to want to apply Dakin's solution to this I'm not extreme short what value this would be. I would suggest Iodoflex which she still has at home. 07/28/16; I follow Mrs. Arreaga episodically along with Dr. Marla Roe. 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  She has been applying a topical collagen ointment to both wounds with Adaptic. I don't think Dr. Marla Roe is planning to take her back to the OR. 08/19/16; I follow  Mrs. Crail on a monthly basis along with Dr. Marla Roe of plastic surgery. 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 I been following her for a number of years. At one point I was able to get the right medial malleolus wound to heal and had the left medial malleolus down to about half its current size however and I had to send her to plastic surgery for an operative debridement. Since then things have been stable to slightly improve the area on the right is slightly better one in the left about the same although there is much less adherent surface than I'm used to with this patient. She is using some form of liquid collagen gel that Dr. Marla Roe provided a Kerlix cover with the patient's own pressure stockings. She tells me that she has extreme pain in both ankles and along the lateral aspect of both feet. She has been unable to work for some period of time. She is telling me she is retiring at the beginning of April. She sees Dr. Doran Durand of orthopedics next week 09/22/16; patient has not seen Dr. Marla Roe since the last time she is here. I'm not really sure what she is using to the wounds other than bits and pieces of think she had left over including most recently Hydrofera Blue. She is using juxtalite stockings. She is having difficulty with her husband's recent illness "stroke". She is having to transport him to various doctors appointments. Dr. Marla Roe left her the option of a repeat debridement with ACEL however she has not been able to get the time to follow-up on this. She continues to have a fair amount of drainage out of these wounds with certainly precludes leaving dressings on all week 10/13/16; patient has not seen Dr. Marla Roe since she was last in  our clinic. I'm not really sure what she is doing with the wounds, we did try to get her [facility] and I think she is actually using this most of the time. Because of drainage she states she has to change this every second day although this is an improvement from what she used to do. She went to see Dr. Doran Durand who did not think she had a muscular issue with regards to her feet, he referred her to a neurologist and I think the appointment is sometime in June. I changed her back to Iodoflex which she has used in the past but not recently. 11/03/16; the patient has been using Iodoflex although she ran out of this. Still claims that there is a lot of drainage although the wound does not look like this. No surrounding erythema. She has not been back to see Dr. Marla Roe 11/24/16; the patient has been using Iodoflex again but she ran out of it 2 or 3 days ago. There is no major change in the condition of either one of these wounds in fact they are larger and covered in a thick adherent surface slough/nonviable tissue especially on the left. She does not tolerate mechanical debridement in our clinic. Going back to see Dr. Marla Roe of plastic surgery for an operative debridement would seem reasonable. 12/15/16; the patient has not been back to see Dr. Marla Roe. She is been dealing with a series of illnesses and her husband which of monopolized her time. She is been using Sorbact which we largely supplied. She states the drainage is bad enough that it maximum she can go 2-3 days without changing the dressing 01/12/2017 --  the patient has not been back for about 4 weeks and has not seen Dr. Marla Roe not does she have any appointment pending. 01/23/17; patient has not seen Dr. Marla Roe even though I suggested this previously. She is using Santyl that was suggested last week by Dr. Con Memos this Cost her $16 through her insurance which is indeed surprising 02/12/17; continuing Santyl and the patient is  changing this daily. A lot of drainage. She has not been back to see plastic surgery she is using an Ace wrap. Our intake nurse suggested wrap around stockings which would make a good reasonable alternative 02/26/17; patient is been using Santyl and changing this daily due to drainage. She has not been to see plastic surgery she uses in April Ace wrap to control the edema. She did obtain extremitease stockings but stated that the edema in her leg was to big for these 03/20/17; patient is using Santyl and Anasept. Surfaces looked better today the area on the right is actually measuring a little smaller. She has states she has a lot of pain in her feet and ankles and is asking for a consult to pain control which I'll try to help her with through our case manager. 04/10/17; the patient arrives with better-looking wound surfaces and is slightly smaller wound on the left she is using a combination of Santyl and Anasept. She has an appointment or at least as started in the pain control center associated with [facility] 05/14/17; this is a patient who I followed for a prolonged period of time. 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 At one point I had this down to a much smaller wound on the left however these reopened and we've never been able to get these to heal. She has been using Santyl and Anasept gel although 2 weeks ago she ran out of the Anasept gel. She has a stable appearance of the wound. She is going to the wound care clinic at [facility]. They wanted do a nerve block/spinal block although she tells me she is reluctant to go forward with that. 05/21/17; this is a patient I have followed for many years. 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 Chronic pain and deformity in her ankles as well. She is been to see plastic surgery as well as orthopedics. Using PolyMem AG most recently/Kerramax/ABDs and 2 layer compression. She has  managed to keep this on and she is coming in for a nurse check to change the dressing on Tuesdays, we see her on Fridays 06/05/17; really quite a good looking surface and the area especially on the right medial has contracted in terms of dimensions. Well granulated healthy-looking tissue on both sides. Even with an open curet there is nothing that even feels abnormal here. This is as good as I've seen this in quite some time. We have been using PolyMem AG and bringing her in for a nurse check 06/12/17; really quite good surface on both of these wounds. The right medial has contracted a bit left is not. We've been using PolyMem and AG and she is coming in for a nurse visit 06/19/17; we have been using PolyMem AG and bringing her in for a nurse check. Dimensions of her wounds are not better but the surfaces looked better bilaterally. She complained of bleeding last night and the left wound and increasing pain bilaterally. She states her wound pain is more neuropathic than just the wounds. There was some suggestion that this  was radicular from her pain management doctor in talking to her it is really difficult to sort this out. 06/26/17; using PolyMem and AG and bringing her in for a nurse check as All of this and reasonably stable condition. Certainly not improved. The dimensions on the lateral part of the right leg look better but not really measuring better. The medial aspect on the left is about the same. 07/03/16; we have been using PolyMen AG and bringing her in for a nurse check to change the dressings as the wounds have drainage which precludes once weekly changing. We are using all secondary absorptive dressings.our intake nurse is brought up the idea of using a wound VAC/snap VAC on the wound to help with the drainage to see if this would result in some contraction. This is not a bad idea. The area on the right medial is actually looking smaller. Both wounds have a reasonable-looking surface. There is  no evidence of cellulitis. The edema is well controlled 07/10/17; the patient was denied for a snap VAC by her insurance. The major issue with these wounds continues to be drainage. We are using wicked PolyMem AG and she is coming in for a nurse visit to change this. The wounds are stable to slightly improved. The surface looks vibrant and the area on the right certainly has shrunk in size but very slowly 07/17/17; the patient still has large wounds on her bilateral medial malleoli. Surface of both of these wounds looks better. The dimensions seem to come and go but no consistent improvement. There is no epithelialization. We do not have options for advanced treatment products due to insurance issues. They did not approve of the wound VAC to help control the drainage. More recently we've been using PolyMem and AG wicked to allow drainage through. We have been bringing her in for a nurse visit to change this. We do not have a lot of options for wound care products and the home again due to insurance issues 07/24/17; the patient's wound actually looks somewhat better today. No drainage measurements are smaller still healthy-looking surface. We used silver collagen under PolyMen started last week. We have been bringing her in for a dressing change 07/31/17; patient's wound surface continued to look better and I think there is visible change in the dimensions of the wound on the right. Rims of epithelialization. We have been using silver collagen under PolyMen and bringing her in for a dressing change. There appears to be less drainage although she is still in need of the dressing change 08/07/17. Patient's wound surface continues to look better on both sides and the area on the right is definitely smaller. We have been using silver collagen and PolyMen. She feels that the drainage has been it has been better. I asked her about her vascular status. She went to see Dr. Aleda Grana at [facility] and had some  form of ablation. I don't have much detail on this. I haven't my notes from 2016 that she was not a candidate for any further ablation but I don't have any more information on this. We had referred her to vein and vascular I don't think she ever went. He does not have a history of PAD although I don't have any information on this either. We don't even have ABIs in our record 08/14/17; we've been using silver collagen and PolyMen cover. And putting the patient and compression. She we are bringing her in as a nurse visit to change this because ofarge  amount of drainage. We didn't the ABIs in clinic today since they had been done in many moons 1.2 bilaterally. She has been to see vein and vascular however this was at [facility] and she had ablation although I really don't have any information on this all seemed biking get a report. She is also been operatively debrided by plastic surgery and had a cell placed probably 8-12 months ago. This didn't have a major effect. We've been making some gains with current dressings 08/19/17-She is here in follow-up evaluation for bilateral medial malleoli ulcers. She continues to tolerate debridement very poorly. We will continue with recently changed topical treatment; if no significant improvement may consider switching to Iodosorb/Iodoflex. 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 She has been using silver collagen and PolyMem. I believe she has been used and tried on Iodoflex before. 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 These have been very refractory over time. We have been using silver collagen and PolyMen. She is coming in once a week for a doctor's and once a week for nurse visits. We are actually making some  progress 09/18/17; the patient's wounds are smaller especially on the right medial. She arrives today to upset to consider even washing these off with Anasept which I think is been part of the reason this is been closing. We've been using collagen covered in PolyMen otherwise. It is noted that she has a small area of folliculitis on the right medial calf that. As we are wrapping her legs I'll give her a short course of doxycycline to make sure this doesn't amount to anything. She is a long list of complaints today including imbalance, shortness of breath on exertion, inversion of her left ankle. With regards to the latter complaints she is been to see orthopedics and they offered her a tendon release surgery I believe but wanted her wounds to be closed first. I have recommended she go see her primary physician with regards to everything else. 09/25/17; patient's wounds are about the same size. We have made some progress bilaterally although not in recent weeks. She will not allow me T wash these wounds with Anasept even if she is doing her cell. Wheeze we've been using collagen covered in PolyMen. Last week she had a small area of folliculitis this is now opened into a small wound. She completed 5 days of trimethoprim sulfamethoxazole 10/02/17; unfortunately the area on her left medial ankle is worse with a larger wound area towards the Achilles. The patient complains of a lot of pain. She will not allow debridement although visually I don't think there is anything to debridement in any case. We have been using silver collagen and PolyMen for several months now. Initially we are making some progress although I'm not really seeing that today. We will move back to [facility]. His admittedly this is a bit of a repeat however I'm hoping that his situation is different now. The patient tells me she had her leg on the left give out on her yesterday this is process some pain. 10/09/17; the patient is seen  twice a week largely because of drainage issues coming out of the chronic medial bimalleolar wounds that are chronic. Last week the dimensions of the one on the left looks a  little larger I changed her to [facility]. She comes in today with a history of terrible pain in the bilateral wound areas. She will not allow debridement. She will not even allow a tissue culture. There is no surrounding erythema no no evidence of cellulitis. We have been putting her Kerlix Coban man. She will not allow more aggressive compression as there was a suggestion to put her in 3 layer wraps. 10/16/17; large wounds on her bilateral medial malleoli. These are chronic. Not much change from last week. The surface looks have healthy but absolutely no epithelialization. A lot of pain little less so of drainage. She will not allow debridement or even washing these off in the vigorous fashion with Anasept. 10/23/17; large wounds on her bilateral malleoli which are chronic. Some improvement in terms of size perhaps on the right since last time I saw these. She states that after we increased the 3 layer compression there was some bleeding, when she came in for a nurse visit she did not want 3 layer compression put back on about our nurse managed to convince her. She has known chronic venous visit issues and I'm hoping to get her to tolerate the 3 layer compression. using Hydrofera Blue 10/30/17; absolutely no change in the condition of either wound although we've had some improvement in dimensions on the right.. Attempted to put her in 3 layer compression she didn't tolerated she is back in 2 layer compression. We've been using Hydrofera Blue We looked over her past records. She had venous reflux studies in November 2016. There was no evidence of deep venous reflux on the right. Superficial vein did not show the greater saphenous vein at think this is been previously ablated the small saphenous vein was within normal limits. The  left deep venous system showed no DVT the vessels were positive for deep venous reflux in the posterior tibial veins at the ankle. The greater saphenous vein was surgically absent small saphenous vein was within normal limits. She went to vein and vascular at [facility]. I believe she had an ablation on the left greater saphenous vein. I'll update her reflux studies perhaps ever reviewed by vein and vascular. We've made absolutely no progress in these wounds. Will also try to read and TheraSkins through her insurance 11/06/17; W the patient apparently has a 2 week follow-up with vein and vascular I like him to review the whole issue with regards to her previous vascular workup by Dr. Aleda Grana. We've really made no progress on these wounds in many months. She arrives today with less viable looking surface on the left medial ankle wound. This was apparently looking about the same on Tuesday when she was here for nurse visit. 11/13/17; deep tissue culture I did last time of the left lower leg showed multiple organisms without any predominating. In particular no Staphylococcus or group A strep were isolated. We sent her for venous reflux studies. She's had a previous left greater saphenous vein stripping and I think sclerotherapy of the right greater saphenous vein. She didn't really look at the lesser saphenous vein this both wounds are on the medial aspect. She has reflux in the common femoral vein and popliteal vein and an accessory vein on the right and the common femoral vein and popliteal vein on the left. I'm going to have her go to see vein and vascular just the look over things and see if anything else beside aggressive compression is indicated here. We have not been able to  make any progress on these wounds in spite of the fact that the surface of the wounds is never look too bad. 11/20/17; no major change in the condition of the wounds. Patient reports a large amount of drainage. She has a  lot of complaints of pain although enlisting her today I wonder if some of this at least his neuropathic rather than secondary to her wounds. She has an appointment with vein and vascular on 12/30/17. The refractory nature of these wounds in my mind at least need vein and vascular to look over the wounds the recent reflux studies we did and her history to see if anything further can be done here. I also note her gait is deteriorated quite a bit. 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 She has a bilateral Trendelenburg gait. I wonder if this is neuropathic or perhaps multilevel radicular. 11/27/17; her wounds actually looks slightly better. Healthy-looking granulation tissue a scant amount of epithelialization. [place] healthcare will not pay for [employer]. They will play for tri layer Oasis and Dermagraft. This is not a diabetic ulcer. We'll try for the tri layer Oasis. She still complains of some drainage. She has a vein and vascular appointment on 12/30/17 12/04/17; the wounds visually look quite good. Healthy-looking granulation with some degree of epithelialization. We are still waiting for response to our request for trial to try layer Oasis. Her appointment with vascular to review venous and arterial issues isn't sold the end of July 7/31. Not allow debridement or even vigorous cleansing of the wound surface. 12/18/17; slightly smaller especially on the right. 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 We've been using Hydrofera Blue. We still are looking into triple layer Oasis through her insurance 01/08/18 on evaluation today patient's wound actually appears to be showing signs of good improvement at this point in time. She has been tolerating the dressing changes without complication. Fortunately there does not appear to be any evidence of infection at this point in time. We have been utilizing silver nitrate which does seem to be of benefit for her which is  also good news. Overall I'm very happy with how things seem to be both regards appearance as well as measurement. Patient did see Dr. Bridgett Larsson for evaluation on 12/30/17. In his assessment he felt that stripping would not likely add much more than chronic compression to the patient's healing process. His recommendation was to follow-up in three months with Dr. Doren Custard if she hasn't healed in order to consider referral back to you and see vascular where she previously was in a trial and was able to get her wound to heal. I'll be see what she feels she when you staying compression and he reiterated this as well. 01/13/18 on evaluation today patient appears to actually be doing very well in regard to her bilateral medial malleolus ulcers. She seems to have tolerated the chemical cauterization with silver nitrate last week she did have some pain through that evening but fortunately states that I'll be see since it seems to be doing better she is overall pleased with the progress. 01/21/18; really quite a remarkable improvement since I've last seen these wounds. We started using silver nitrate specially on the islands of hyper granulation which for some reason her around the wound circumference. This is really done quite nicely. Primary dressing Hydrofera Blue under 4 layer compression. She seems to be able to hold out without a nurse rewrap. Follow-up in 1 week 01/28/18; we've continued  the hydrofera blue but continued with chemical cauterization to the wound area that we started about a month ago for irregular hyper granulation. She is made almost stunning improvement in the overall wound dimensions. I was not really expecting this degree of improvement in these chronic wounds 02/05/18; we continue with Hydrofera Bluebut of also continued the aggressive chemical cauterization with silver nitrate. We made nice progress with the right greater than left wound. 02/12/18. We continued with Hydrofera Blue after  aggressive chemical cauterization with silver nitrate. We appear to be making nice progress with both wound areas 02/19/2018; we continue with [facility] after washing the wounds vigorously with Anasept spray and chemical cauterization with silver nitrate. We are making excellent progress. The area on the right's just about closed 02/26/2018. The area on the left medial ankle had too much necrotic debris today. I used a #5 curette we are able to get most of the soft. I continued with the silver nitrate to the much smaller wound on the right medial ankle she had a new area on her right lower pretibial area which she says was due to a role in her compression 03/05/2018; both wound areas look healthy. Not much change in dimensions from last week. I continue to use silver nitrate and Hydrofera Blue. The patient saw Dr. Doren Custard of vein and vascular. He felt she had venous stasis ulcers. He felt based on her previous arteriogram she should have adequate circulation for healing. Also she has deep venous reflux but really no significant correctable superficial venous reflux at this time. He felt we should continue with conservative management including leg elevation and compression 04/02/2018; since we last saw this woman about a month ago she had a fall apparently suffered a pelvic fracture. I did not look up the x-ray. Nevertheless because of pain she literally was bedbound for 2 weeks and had home health coming out to change the dressing. Somewhat predictably this is resulted in considerable improvement in both wound areas. The right is just about closed on the medial malleolus and the left is about half the size. 04/16/2018; both her wounds continue to go down in size. Using Hydrofera Blue. 05/07/18; both her wounds appeared to be improving especially on the right where it is almost closed. We are using Hydrofera Blue 05/14/2018; slightly worse this week with larger wounds. Surface on the left medial not  quite as good. We have been using Hydrofera Blue 05/21/18; again the wounds are slightly larger. Left medial malleolus slightly larger with eschar around the circumference. We have been using Hydrofera Blue undergoing a wraps for a prolonged period of time. This got a lot better when she was more recumbent due to a fall and a back injury. I change the primary dressing the silver alginate today. She did not tolerate a 4 layer compression previously although I may need to bring this up with her next time 05/28/2018; area on the left medial malleolus again is slightly larger with more drainage. Area on the right is roughly unchanged. She has a small area of folliculitis on the right medial just on the lower calf. This does not look ominous. 06/03/2018 left medial malleolus slightly smaller in a better looking surface. We used silver nitrate on this last time with silver alginate. The area on the right appears slightly smaller 1/10; left medial malleolus slightly smaller. Small open area on the right. We used silver nitrate and silver alginate as of 2 weeks ago. We continue with the wound and  compression. These got a lot better when she was off her feet 1/17; right medial malleolus wound is smaller. The left may be slightly smaller. Both surfaces look somewhat better. 1/24; both wounds are slightly smaller. Using silver alginate under Unna boots 1/31; both wounds appear smaller in fact the area on the right medial is just about closed. Surface eschar. We have been using silver alginate under Unna boots. The patient is less active now spends let much less time on her feet and I think this is contributed to the general improvement in the wound condition 2/7; both wounds appear smaller. I was hopeful the right medial would be closed however there there is still the same small open area. Slight amount of surface eschar on the left the dimensions are smaller there is eschar but the wound edges appear to be free.  We have been using silver alginate under Unna boot's 2/14; both wounds once again measure smaller. Circumferential eschar on the left medial. We have been using silver alginate under Unna boots with gradual improvement 2/21; the area on the right medial malleolus has healed. The area on the left is smaller. We have been using silver alginate and Unna boots. We can discharge wrapping the right leg she has 20/30 stockings at home she will need to protect the scar tissue in this area 2/28; the area on the right medial malleolus remains closed the patient has a compression stocking. The area on the left is smaller. We have been using silver alginate and Unna boots. 3/6 the area on the right medial ankle remains closed. Good edema control noted she is using her own compression stocking. The area on the left medial ankle is smaller. We have been managing this with silver alginate and Unna boots which we will continue today. 3/13; the area on the right medial ankle remains closed and I'm declaring it healed today. When necessary the left is about the same still a healthy-looking surface but no major change and wound area. No evidence of infection and using silver alginate under unna and generally making considerable improvement 3/27 the area on the right medial ankle remains closed the area on the left is about the same as last week. Certainly not any worse we have been using silver alginate under an Unna boot 4/3; the area on the right medial ankle remains closed per the patient. We did not look at this wound. The wound on the left medial ankle is about the same surface looks healthy we have been using silver alginate under an Unna boot 4/10; area on the right medial ankle remains closed per the patient. We did not look at this wound. The wound on the left medial ankle is slightly larger. The patient complains that the [facility] caused burning pain all week. She also told us that she was a lot more  active this week. Changed her back to silver alginate 4/17; right medial ankle still closed per the patient. Left medial ankle is slightly larger. Using silver alginate. She did not tolerate Hydrofera Blue on this area 4/24; right medial ankle remains closed we have not look at this. The left medial ankle continues to get larger today by about a centimeter. We have been using silver alginate under Unna boots. She complains about 4 layer compression as an alternative. She has been up on her feet working on her garden 5/8; right medial ankle remains closed we did not look at this. The left medial ankle has increased in size  about 100%. We have been using silver alginate under Unna boots. She noted increased pain this week and was not surprised that the wound is deteriorated 5/15; no major change in SA however much less erythema ( one week of doxy ocellulitis). 5/22-64 year old female returns at 1 week to the clinic for left medial ankle wound for which we have been using silver alginate under 3 layer compression She was placed on DOXY at last visit - the wound is wider at this visit. She is in 3 layer compression 5/29; change to [facility] last week. I had given her empiric doxycycline 2 weeks ago for a week. She is in 3 layer compression. She complains of a lot of pain and drainage on presentation today. 6/5; using Hydrofera Blue. I gave her doxycycline recently empirically for erythema and pain around the wound. Believe her cultures showed enterococcus which not would not have been well covered by doxycycline nevertheless the wound looks better and I don't feel specifically that the enterococcus needs to be covered. She has a new what looks like a wrap injury on her lateral left ankle. 6/12; she is using Hydrofera Blue. She has a new area on the left anterior lower tibial area. This was a wrap injury last week. 6/19; the patient is using Hydrofera Blue. She arrived with marked inflammation and  erythema around the wound and tenderness. 12/01/18 on evaluation today patient appears to be doing a little bit better based on what I'm hearing from the standpoint of lassos evaluation to this as far as the overall appearance of the wound is concerned. Then sometime substandard she typically sees Dr. Dellia Nims. Nonetheless overall very pleased with the progress that she's made up to this point. No fevers, chills, nausea, or vomiting noted at this time. 7/10; some improvement in the surface area. Aggressively debrided last week apparently. I went ahead with the debridement today although the patient does not tolerate this very well. We have been using Iodoflex. Still a fair amount of drainage 7/17; slightly smaller. Using Iodoflex. 7/24; no change from last week in terms of surface area. We have been using Iodoflex. Surface looks and continues to look somewhat better 7/31; surface area slightly smaller better looking surface. We have been using Iodoflex. This is under Unna boot compression 8/7-Patient presents at 1 week with Unna boot and Iodoflex, wound appears better 8/14-Patient presents at 1 week with Iodoflex, we use the Unna boot, wound appears to be stable better.Patient is getting Botox treatment for the inversion of the foot for tendon release, Next week 8/21; we are using Iodoflex. Unna boot. The wound is stable in terms of surface area. Under illumination there is some areas of the wound that appear to be either epithelialized or perhaps this is adherent slough at this point I was not really clear. It did not wipe off and I was reluctant to debride this today. 8/28; we are using Iodoflex in an Unna boot. Seems to be making good improvement. 9/4; using Iodoflex and wound is slightly smaller. 9/18; we are using Iodoflex with topical silver nitrate when she is here. The wound continues to be smaller 10/2; patient missed her appointment last week due to GI issues. She left and Iodoflex based  dressing on for 2 weeks. Wound is about the same size about the size of a dime on the left medial lower 10/9 we have been using Iodoflex on the medial left ankle wound. She has a new superficial probable wrap injury on the dorsal  left ankle 10/16; we have been using Hydrofera Blue since last week. This is on the left medial ankle 10/23; we have been using Hydrofera Blue since 2 weeks ago. This is on the left medial ankle. Dimensions are better 11/6; using Hydrofera Blue. I think the wound is smaller but still not closed. Left medial ankle 11/13; we have been using Hydrofera Blue. Wound is certainly no smaller this week. Also the surface not as good. This is the remanent of a very large area on her left medial ankle. 11/20; using Sorbact since last week. Wound was about the same in terms of size although I was disappointed about the surface debris 12/11; 3-week follow-up. Patient was on vacation. Wound is measuring slightly larger we have been using Sorbact. 12/18; wound is about the same size however surface looks better last week after debridement. We have been using Sorbact under compression 1/15 wound is probably twice the size of last time increased in length nonviable surface. We have been using Sorbact. She was running a mild fever and missed her appointment last week 1/22; the wound is come down in size but under illumination still a very adherent debris we have been Hydrofera Blue that I changed her to last week 1/29; dimensions down slightly. We have been using Hydrofera Blue 2/19 dimensions are the same however there is rims of epithelialization under illumination. Therefore more the surface area may be epithelialized 2/26; the patient's wound actually measures smaller. The wound looks healthy. We have been using Hydrofera Blue. I had some thoughts about running Apligraf then I still may do that however this looks so much better this week we will delay that for now 3/5; the wound is small  but about the same as last week. We have been using Hydrofera Blue. No debridement is required today. 3/19; the wound is about the size of a dime. Healthy looking wound even under illumination. We have been using Hydrofera Blue. No mechanical debridement is necessary 3/26; not much change from last week although still looks very healthy. We have been using Hydrofera Blue under Unna boots Patient was offered an ankle fusion by podiatry but not until the wound heals with a proceed with this. 4/9; the patient comes in today with her original wound on the medial ankle looking satisfactory however she has some uncontrolled swelling in the middle part of her leg with 2 new open areas superiorly just lateral to the tibia. I think this was probably a wrap issue. She said she felt uncomfortable during the week but did not call in. We have been using Hydrofera Blue 4/16; the wound on the medial ankle is about the same. She has innumerable small areas superior to this across her mid tibia. I think this is probably folliculitis. She is also been working in the yard doing a lot of sweating 4/30; the patient issue on the upper areas across her mid tibia of all healed. I think this was excessive yard work if I remember. Her wound on the medial ankle is smaller. Some debris on this we have been using Hydrofera Blue under Unna boots 5/7; mid tibia. She has been using Hydrofera Blue under an Unna wrap. She is apparently going for her ankle surgery on June 3 10/28/19-Patient returns to clinic with the ankle wound, we are using Hydrofera Blue under Unna wrap, surgery is scheduled for her left foot for June 3 so she will be back for nurse visit next week READMISSION 01/17/2020 Mrs. Romas is a  65 year old woman we have had in this clinic for a long period of time with severe venous hypertension and refractory wounds on her medial lower legs and ankles bilaterally. 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 When she was off her legs for a prolonged period example when she fell and suffered a compression fracture things would heal up quite nicely. She is now retired and we managed to heal up the right medial leg wound. The left one was very tiny last time I saw this although still refractory. 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 It got to the point where this was interfering with her walking and she elected to proceed with a ankle arthrodesis to straighten her her ankle and leave her with a functional outcome for mobilization. The patient was referred to Dr. Doren Custard and really this took some time to arrange. Dr. Doren Custard saw her on 12/07/2019. Once again he verified that she had no arterial issues. She had previously had an angiogram several years ago. Follow-up ABIs on the left showed an ABI of 1.12 with triphasic waveforms and a TBI of 0.92. She is felt to have chronic deep venous insufficiency but I do not think it was felt that anything could be done from about this from an ablation point of view. At the time Dr. Doren Custard saw this patient the wounds actually look closed via the pictures in his clinic. The patient finally underwent her surgery on 12/15/2019. This went reasonably well and there was a good anatomic outcome. She developed a small distal wound dehiscence on the lateral part of the surgical wound. However more problematically she is developed recurrence of the wound on the medial left ankle. There are actually 2 wounds here one in the distal lower leg and 1 pretty much at the level of the medial malleolus. It is a more distal area that is more problematic. She has been using Hydrofera Blue which started on Friday before this she was simply Ace wrapping. There was a culture done that showed Pseudomonas  and she is on ciprofloxacin. A recent CNS on 8/11 was negative. The patient reports some pain but I generally think this is improving. She is using a cam boot completely nonweightbearing using a walker for pivot transfers and a wheelchair 8/24; not much improvement unfortunately she has a surgical wound on the lateral part in the venous insufficiency wound medially. The bottom part of the medial insufficiency wound is still necrotic there is exposed tendon here. We have been using Hydrofera Blue under compression. Her edema control is however better 8/31; patient in for follow-up of his surgical wound on the lateral part of her left leg and chronic venous insufficiency ulcers medially. We put her back in compression last week. She comes in today with a complaint of 3 or 4 days worth of increasing pain. She felt her cam walker was rubbing on the area on the back of her heel. However there is intense erythema seems more likely she has cellulitis. She had 2 cultures done when she was seeing podiatry in the postop. One of them in late July showed Pseudomonas and she received a course of ciprofloxacin the other was negative on 8/11 she is allergic to penicillin with anaphylactoid complaints of hives oral swelling via information  in epic 9/9; when I saw this patient last week she had intense anterior erythema around her wound on the right lateral heel and ankle and also into the right medial heel. Some of this was no doubt drainage and her walker boot however I was convinced she had cellulitis. I gave her Levaquin and Bactrim she is finishing up on this now. She is following up with Dr. [name] he saw her yesterday. He is taken her out of the walking boot of course she is still nonweightbearing. Her x-ray was negative for any worrisome features such as soft tissue air etc. Things are a lot better this week. She has home health. We have been using Hydrofera Blue under an [employer] which she put back on  yesterday. I did not wrap her last week 9/17; her surrounding skin looks a lot better. In fact the area on the left lateral ankle has just a scant amount of eschar. The only remaining wound is the large area on the left medial ankle. Probably about 60% of this is healthy granulation at the surface however she has a significant divot distally. This has adherent debris in it. I been using debridement and silver collagen to try and get this area to fill-in although I do not think we have made much progress this week 9/24; the patient's wound on the left medial ankle looks a lot better. The deeper divot area distally still requires debridement but this is cleaning up quite nicely we have been using silver collagen. The patient is complaining of swelling in her foot and is worried that that is contributing to the nonhealing of the ankle wound. She is also complaining of numbness in her anterior toes 10/4; left medial ankle. The small area distally still has a divot with necrotic material that I have been debriding away. This has an undermining area. She is approved for Apligraf. She saw Dr. [name] her surgeon on 10/1. I think he declared himself is satisfied with the condition of things. Still nonweightbearing till the end of the month. 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 Her surgical wound is well closed. There is no evidence of infection 10/11; the patient arrived in clinic today with the expectation that we be able to put an Apligraf on this area after debridement however she arrives with a relatively large amount of green drainage on the dressing. The patient states that this started on Friday. She has not been systemically unwell. 10/19; culture I did last week showed both Enterococcus and Pseudomonas. I think this came in separate parts because I stopped her ciprofloxacin I gave her and prescribed her linezolid however now looking at the final culture result this  is Pseudomonas which is resistant to quinolones. She has not yet picked up the linezolid apparently phone issues. We are also trying to get a topical antibiotic out of [place] in [place] they can be applied by home health. She is still having green drainage 10/16; the patient has her topical antibiotic from [facility] in [place]. This is a compounded gel with vancomycin and ciprofloxacin and gentamicin. We are applying this on the wound bed with silver alginate over the top with Unna boot wraps. She arrives in clinic today with a lot less ominous looking drainage although she is only use this topical preparation once the second time today. She sees Dr. [name] her surgeon on Friday she has home health changing the dressing 11/2; still using her compounded topical  antibiotic under silver alginate. Surface is cleaning up there is less drainage. We had an Apligraf for her today and I elected to apply it. A light coating of her antibiotic 04/25/2020 upon evaluation today patient appears to be doing well with regard to her ankle ulcer. There is a little bit of slough noted on the surface of the wound I am can have to perform sharp debridement to clear this away today. With that being said other than that fact overall I feel like she is making progress and we do see some new epithelial growth. There is also some improvement in the depth of the wound and that distal portion. There is little bit of slough there as well. 12/7; 2-week follow-up. Apligraf #3. Dimensions are smaller. Closing in especially inferiorly. Still some surface debris. Still using the [facility] topical antibiotic but I told her that I don't think this needs to be renewed 12/21; 2-week follow-up. Apligraf #4 dimensions are smaller. Nice improvement 06/05/2020; 2-week follow-up. The patient's wound on the left medial ankle looks really excellent. Nice granulation. Advancing epithelialization no undermining no evidence of  infection. We would have to reapply for another Apligraf but with the condition of this wound I did not feel strongly about it. We used Hydrofera Blue under the same degree of compression. She follows up with Dr. [name] her surgeon a week Friday 06/13/2020 upon evaluation today patient appears to be doing excellent in regard to her wound. She has been tolerating the dressing changes without complication. Fortunately there is no signs of active infection at this time. No fevers, chills, nausea, vomiting, or diarrhea. She was using Hydrofera Blue last week. Electronic Signature(s) Signed: 06/13/2020 2:00:18 PM By: [name] PA-C Entered By: [name] on 06/13/2020 14:00:18 -------------------------------------------------------------------------------- Physical Exam Details Patient Name: Date of Service: [facility] [name], [name] 06/13/2020 1:00 PM Medical Record Number: [id number] Patient Account Number: [id number] Date of Birth/Sex: Treating RN: [birth year] (65 y.o. [name] Primary Care Provider: [name] Other Clinician: Referring Provider: Treating Provider/Extender: [name] in Treatment: 21 Constitutional Well-nourished and well-hydrated in no acute distress. Respiratory normal breathing without difficulty. Psychiatric this patient is able to make decisions and demonstrates good insight into disease process. Alert and Oriented x 3. pleasant and cooperative. Notes Upon inspection patient's wound bed actually showed signs of good granulation at this time. I think she may benefit from the use of plain collagen which I think would be best at this point in time. That may feel better as well than the [facility]. Electronic Signature(s) Signed: 06/13/2020 2:00:53 PM By: [name] PA-C Entered By: [name] on 06/13/2020 14:00:52 -------------------------------------------------------------------------------- Physician Orders  Details Patient Name: Date of Service: [facility] [name], [name] 06/13/2020 1:00 PM Medical Record Number: [id number] Patient Account Number: [id number] Date of Birth/Sex: Treating RN: [birth year] (65 y.o. [name] Primary Care Provider: [name] Other Clinician: Referring Provider: Treating Provider/Extender: [name] in Treatment: 86 Verbal / Phone Orders: No Diagnosis Coding ICD-10 Coding Code Description [phone] Chronic venous hypertension (idiopathic) with ulcer and inflammation of left lower extremity L97.828 Non-pressure chronic ulcer of other part of left lower leg with other specified severity L97.328 Non-pressure chronic ulcer of left ankle with other specified severity Follow-up Appointments ppointment in 1 week. - Tues. with Dr. Dellia Nims Return A Cellular or Tissue Based Products Cellular or Tissue Based Product Type: - Re-run IVR for apligraf Bathing/  Shower/ Hygiene May shower with protection but do not get wound dressing(s) wet. Edema Control - Lymphedema / SCD / Other Elevate legs to the level of the heart or above for 30 minutes daily and/or when sitting, a frequency of: Avoid standing for long periods of time. Exercise regularly Compression stocking or Garment 20-30 mm/Hg pressure to: - right leg daily Wound Treatment Wound #15 - Malleolus Wound Laterality: Left, Medial Cleanser: Soap and Water 1 x Per Week/15 Days Discharge Instructions: May shower and wash wound with dial antibacterial soap and water prior to dressing change. Peri-Wound Care: Triamcinolone 15 (g) 1 x Per Week/15 Days Discharge Instructions: Use triamcinolone 15 (g) as directed Peri-Wound Care: Sween Lotion (Moisturizing lotion) 1 x Per Week/15 Days Discharge Instructions: Apply moisturizing lotion as directed Prim Dressing: Promogran Prisma Matrix, 4.34 (sq in) (silver collagen) 1 x Per Week/15 Days ary Discharge Instructions: Moisten collagen with saline  or hydrogel Secondary Dressing: Woven Gauze Sponge, Non-Sterile 4x4 in (Home Health) 1 x Per Week/15 Days Discharge Instructions: Apply over primary dressing as directed. Secondary Dressing: ABD Pad, 5x9 (Home Health) 1 x Per Week/15 Days Discharge Instructions: Apply over primary dressing as directed. Compression Wrap: Unnaboot w/Calamine, 4x10 (in/yd) (Home Health) 1 x Per Week/15 Days Discharge Instructions: Apply Unnaboot as directed. Electronic Signature(s) Signed: 06/13/2020 5:15:08 PM By: [name] PA-C Signed: 06/13/2020 5:15:42 PM By: Baruch Gouty RN, BSN Entered By: Baruch Gouty on 06/13/2020 13:35:42 -------------------------------------------------------------------------------- Problem List Details Patient Name: Date of Service: [name] [name], [name] 06/13/2020 1:00 PM Medical Record Number: [id number] Patient Account Number: [id number] Date of Birth/Sex: Treating RN: [birth year] (65 y.o. [name] Primary Care Provider: [name] Other Clinician: Referring Provider: Treating Provider/Extender: [name] in Treatment: 21 Active Problems ICD-10 Encounter Code Description Active Date MDM Diagnosis I87.332 Chronic venous hypertension (idiopathic) with ulcer and inflammation of left 01/17/2020 No Yes lower extremity L97.828 Non-pressure chronic ulcer of other part of left lower leg with other specified 01/17/2020 No Yes severity L97.328 Non-pressure chronic ulcer of left ankle with other specified severity 01/17/2020 No Yes Inactive Problems ICD-10 Code Description Active Date Inactive Date L03.116 Cellulitis of left lower limb 01/31/2020 01/31/2020 T81.31XD Disruption of external operation (surgical) wound, not elsewhere classified, subsequent 01/17/2020 01/17/2020 encounter Resolved Problems Electronic Signature(s) Signed: 06/13/2020 1:10:00 PM By: [name] PA-C Entered By: [name] on 06/13/2020  13:09:59 -------------------------------------------------------------------------------- Progress Note Details Patient Name: Date of Service: [facility] [name], [name] 06/13/2020 1:00 PM Medical Record Number: [id number] Patient Account Number: [id number] Date of Birth/Sex: Treating RN: [birth year] (65 y.o. [name] Primary Care Provider: [name] Other Clinician: Referring Provider: Treating Provider/Extender: [name] in Treatment: 21 Subjective Chief Complaint Information obtained from Patient patient is been followed long-term in this clinic for venous insufficiency ulcers with inflammation, hypertension and ulceration over the medial ankle bilaterally. 01/17/2020; this is a patient who is here for review of postoperative wounds on the left lateral ankle and recurrence of venous stasis ulceration on the left medial History of Present Illness (HPI) the remaining wound is over the left medial ankle. Similar wound over the right medial ankle healed largely with use of Apligraf. Most recently we have been using Hydrofera Blue over this wound with considerable improvement. The patient has been extensively worked up in the past for her venous insufficiency and she is not a candidate for antireflux surgery although I have none of the  details available currently. 08/24/14; considerable improvement today. About 50% of this wound areas now epithelialized. The base of the wound appears to be healthier granulation.as opposed to last week when she had deteriorated a considerable improvement 08/17/14; unfortunately the wound has regressed somewhat. The areas of epithelialization from the superior aspect are not nearly as healthy as they were last week. The patient thinks her Hydrofera Blue slipped. 09/07/14; unfortunately the area has markedly regressed in the 2 weeks since I've seen this. There is an odor surrounding erythema. The healthy granulation tissue that we  had at the base of the wound now is a dusky color. The nurse reports green drainage 09/14/14; the area looks somewhat better than last week. There is less erythema and less drainage. The culture I did did not show any growth. Nevertheless I think it is better to continue the Cipro and doxycycline for a further week. The remaining wound area was debridement. 09/21/14. Wound did not require debridement last week. Still less erythema and less drainage. She can complete her antibiotics. The areas of epithelialization in the superior aspect of the wound do not look as healthy as they did some weeks ago 10/05/14 continued improvement in the condition of this wound. There is advancing epithelialization. Less aggressive debridement required 10/19/14 continued improvement in the condition and volume of this wound. Less aggressive debridement to the inferior part of this to remove surface slough and fibrinous eschar 11/02/14 no debridement is required. The surface granulation appears healthy although some of her islands of epithelialization seem to have regressed. No evidence of infection 11/16/14; lites surface debridement done of surface eschar. The wound does not look to be unhealthy. No evidence of infection. Unfortunately the patient has had podiatry issues in the right foot and for some reason has redeveloped small surface ulcerations in the medial right ankle. Her original presentation involved wounds in this area 11/23/14 no debridement. The area on the right ankle has enlarged. The left ankle wound appears stable in terms of the surface although there is periwound inflammation. There has been regression in the amount of new skin 11/30/14 no debridement. Both wound areas appear healthy. There was no evidence of infection. The the new area on the right medial ankle has enlarged although that both the surfaces appear to be stable. 12/07/14; Debridement of the right medial ankle wound. No no debridement was done on  the left. 12/14/14 no major change in and now bilateral medial ankle wounds. Both of these are very painful but the no overt evidence of infection. She has had previous venous ablation 12/21/14; patient states that her right medial ankle wound is considerably more painful last week than usual. Her left is also somewhat painful. She could not tolerate debridement. The right medial ankle wound has fibrinous surface eschar 12/28/14 this is a patient with severe bilateral venous insufficiency ulcers. For a considerable period of time we actually had the one on the right medial ankle healed however this recently opened up again in June. The left medial ankle wound has been a refractory area with some absent flows. We had some success with Hydrofera Blue on this area and it literally closed by 50% however it is recently opened up Foley. Both of these were debridement today of surface eschar. She tolerates this poorly 01/25/15: No change in the status of this. Thick adherent escar. Very poor tolerance of any attempt at debridement. I had healed the right medial malleolus wound for a considerable amount of time and had the  left one down to about 50% of the volume although this is totally regressed over the last 48 weeks. Further the right leg has reopened. she is trying to make a appointment with pain and vascular, previous ablations with Dr. Aleda Grana. I do not believe there is an arterial insufficiency issue here 02/01/15 the status of the adherent eschar bilaterally is actually improved. No debridement was done. She did not manage to get vascular studies done 02/08/15 continued debridement of the area was done today. The slough is less adherent and comes off with less pressure. There is no surrounding infection peripheral pulses are intact 02/15/15 selective debridement with a disposable curette. Again the slough is less adherent and comes off with less difficulty. No surrounding infection peripheral pulses  are intact. 02/22/15 selective debridement of the right medial ankle wound. Slough comes off with less difficulty. No obvious surrounding infection peripheral pulses are intact I did not debridement the one on the left. Both of these are stable to improved 03/01/15 selective debridement of both wound areas using a curette to. Adherent slough cup soft with less difficulty. No obvious surrounding infection. The patient tells me that 2 days ago she noted a rash above the right leg wrap. She did not have this on her lower legs when she change this over she arrives with widespread left greater than right almost folliculitis-looking rash which is extremely pruritic. I don't see anything to culture here. There is no rash on the rest of her body. She feels well systemically. 03/08/15; selective debridement of both wounds using a curette. Base of this does not look unhealthy. She had limegreen drainage coming out of the left leg wound and describes a lot of drainage. The rash on her left leg looks improved to. No cultures were done. 03/22/15; patient was not here last week. Basal wounds does not look healthy and there is no surrounding erythema. No drainage. There is still a rash on the left leg that almost looks vasculitic however it is clearly limited to the top of where the wrap would be. 04/05/15; on the right required a surgical debridement of surface eschar and necrotic subcutaneous tissue. I did not debridement the area on the left. These continue to be large open wounds that are not changing that much. We were successful at one point in healing the area on the right, and at the same time the area on the left was roughly half the size of current measurements. I think a lot of the deterioration has to do with the prolonged time the patient is on her feet at work 04/19/15 I attempted-like surface debridement bilaterally she does not tolerate this. She tells me that she was in allergic care yesterday with  extreme pain over her left lateral malleolus/ankle and was told that she has an "sprain" 05/03/15; large bilateral venous insufficiency wounds over the medial malleolus/medial aspect of her ankles. She complains of copious amounts of drainage and his usual large amounts of pain. There is some increasing erythema around the wound on the right extending into the medial aspect of her foot to. historically she came in with these wounds the right one healed and the left one came down to roughly half its current size however the right one is reopened and the left is expanded. 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 05/10/15 large bilateral venous insufficiency wounds. There is less adherence surface left however the surface  culture that I did last week grew pseudomonas therefore bilateral selective debridement score necessary. There is surrounding erythema. The patient describes severe bilateral drainage and a lot of pain in the left ankle. Apparently her podiatrist was were ready to do a cortisone shot 05/17/15; the patient complains of pain and again copious amounts of drainage. 05/24/15; we used Iodo flex last week. Patient notes considerable improvement in wound drainage. Only needed to change this once. 05/31/15; we continued Iodoflex; the base of these large wounds bilaterally is not too bad but there is probably likely a significant bioburden here. I would like to debridement just doesn't tolerate it. 06/06/14 I would like to continue the Iodoflex although she still hasn't managed to obtain supplies. She has bilateral medial malleoli or large wounds which are mostly superficial. Both of them are covered circumferentially with some nonviable fibrinous slough although she tolerates debridement very poorly. She apparently has an appointment for an ablation on the right leg by interventional radiology. 06/14/15; the patient arrives with the wounds and  static condition. We attempted a debridement although she does not do well with this secondary to pain. I 07/05/15; wounds are not much smaller however there appears to be a cleaner granulating base. The left has tight fibrinous slough greater than the right. Debridement is tolerated poorly due to pain. Iodoflex is done more for these wounds in any of the multitude of different dressings I have tried on the left 1 and then subsequently the right. 07/12/15; no change in the condition of this wound. I am able to do an aggressive debridement on the right but not the left. She simply cannot tolerate it. We have been using Iodoflex which helps somewhat. It is worthwhile remembering that at one point we healed the right medial ankle wound and the left was about 25% of the current circumference. We have suggested returning to vascular surgery for review of possible further ablations for one reason or another she has not been able to do this. 07/26/15 no major change in the condition of either wound on her medial ankle. I did not attempt to debridement of these. She has been aggressively scrubbing these while she is in the shower at home. She has her supply of Iodoflex which seems to have done more for these wounds then anything I have put on recently. 08/09/15 wound area appears larger although not verified by measurements. Using Iodoflex 09/05/2015 -- she was here for avisit today but had significant problems with the wound and I was asked to see her for a physician opinion. I have summarize that this lady has had surgery on her left lower extremity about 10 years ago where the possible veins stripping was done. She has had an opinion from interventional radiology around November 2016 where no further sclerotherapy was ordered. The patient works 12 hours a day and stands on a concrete floor with work boots and is unable to get the proper compression she requires and cannot elevate her limbs appropriately at any  given time. She has recently grown Pseudomonas from her wound culture but has not started her ciprofloxacin which was called in for her. 09/13/15 this continues to be a difficult situation for this patient. At one point I had this wound down to a 1.5 x 1.5" wound on her left leg. This is deteriorated and the right leg has reopened. She now has substantial wounds on her medial calcaneus, malleoli and into her lower leg. One on the left has surface eschar but  these are far too painful for me to debridement here. She has a vascular surgery appointment next week to see if anything can be done to help here. I think she has had previous ablations several years ago at [facility]. She has no major edema. She tells me that she did not get product last time [facility] and went for several days without it. She continues to work in work boots 12 hours a day. She cannot get compression/4-layer under her work boots. 09/20/15 no major change. Periwound edema control was not very good. Her point with pain and vascular is next Wednesday the 25th 09/28/15; the patient is seen vascular surgery and is apparently scheduled for repeat duplex ultrasounds of her bilateral lower legs next week. 10/05/15; the patient was seen by Dr. Doren Custard of vascular surgery. He feels that she should have arterial insufficiency excluded as cause/contributed to her nonhealing stage she is therefore booked for an arteriogram. She has apparently monophasic signals in the dorsalis pedis pulses. She also of course has known severe chronic venous insufficiency with previous procedures as noted previously. I had another long discussion with the patient today about her continuing to work 12 hour shifts. I've written her out for 2 months area had concerns about this as her work location is currently undergoing significant turmoil and this may lead to her termination. She is aware of this however I agree with her that she simply cannot continue to stand  for 12 hours multiple days a week with the substantial wound areas she has. 10/19/15; the Dr. Doren Custard appointment was largely for an arteriogram which was normal. She does not have an arterial issue. He didn't make a comment about her chronic venous insufficiency for which she has had previous ablations. Presumably it was not felt that anything additional could be done. The patient is now out of work as I prescribed 2 weeks ago. Her wounds look somewhat less aggravated presumably because of this. I felt I would give debridement another try today 10/25/15; no major change in this patient's wounds. We are struggling to get her product that she can afford into her own home through her insurance. 11/01/15; no major change in the patient's wounds. I have been using silver alginate as the most affordable product. I spoke to Dr. Marla Roe last week with her requested take her to the OR for surgical debridement and placement of ACEL. Dr. Marla Roe told me that she would be willing to do this however [facility] will not cover this, fortunately the patient has [place] healthcare of some variant 11/08/15; no major change in the patient's wounds. She has been completely nonviable surface that this but is in too much pain with any attempted debridement are clinic. I have arranged for her to see Dr. Marla Roe ham of plastic surgery and this appointment is on Monday. I am hopeful that they will take her to the OR for debridement, possible ACEL ultimately possible skin graft 11/22/15 no major change in the patient's wounds over her bilateral medial calcaneus medial malleolus into the lower legs. Surface on these does not look too bad however on the left there is surrounding erythema and tenderness. This may be cellulitis or could him sleepy tinea. 11/29/15; no major changes in the patient's wounds over her bilateral medial malleolus. There is no infection here and I don't think any additional antibiotics  are necessary. There is now plan to move forward. She sees Dr. Marla Roe in a week's time for preparation for  operative debridement and ACEL placement I believe on 7/12. She then has a follow-up appointment with Dr. Marla Roe on 7/21 12/28/15; the patient returns today having been taken to the [facility] by Dr. Marla Roe 12/12/15 she underwent debridement, intraoperative cultures [which were negative]. She had placement of a wound VAC. Parent really ACEL was not available to be placed. The wound VAC foam apparently adhered to the wound since then she's been using silver alginate, Xeroform under Ace wraps. She still says there is a lot of drainage and a lot of pain 01/31/16; this is a patient I see monthly. I had referred her to Dr. Marla Roe him of plastic surgery for large wounds on her bilateral medial ankles. She has been to the OR twice once in early July and once in early August. She tells me over the last 3 weeks she has been using the wound VAC with ACEL underneath it. On the right we've simply been using silver alginate. Under Kerlix Coban wraps. 02/28/16; this is a patient I'm currently seeing monthly. She is gone on to have a skin graft over her large venous insufficiency ulcer on the left medial ankle. This was done by Dr. Marla Roe him. The patient is a bit perturbed about why she didn't have one on her right medial ankle wound. She has been using silver alginate to this. 03/06/16; I received a phone call from her plastic surgery Dr. Marla Roe. She expressed some concern about the viability of the skin graft she did on the left medial ankle wound. Asked me to place Endoform on this. She told me she is not planning to do a subsequent skin graft on the right as the left one did not take very well. I had placed Hydrofera Blue on the right 03/13/16; continue to have a reasonably healthy wound on the right medial ankle. Down to 3 mm in terms of size. There is epithelialization here. The area on  the left medial ankle is her skin graft site. I suppose the last week this looks somewhat better. She has an open area inferiorly however in the center there appears to be some viable tissue. There is a lot of surface callus and eschar that will eventually need to come off however none of this looked to be infected. Patient states that the is able to keep the dressing on for several days which is an improvement. 03/20/16 no major change in the circumference of either wound however on the left side the patient was at Dr. Eusebio Friendly office and they did a debridement of left wound. 50% of the wound seems to be epithelialized. I been using Endoform on the left Hydrofera Blue in the right 03/27/16; she arrives today with her wound is not looking as healthy as they did last week. The area on the right clearly has an adherent surface to this a very similar surface on the left. Unfortunately for this patient this is all too familiar problem. Clearly the Endoform is not working and will need to change that today that has some potential to help this surface. She does not tolerate debridement in this clinic very well. She is changing the dressing wants 04/03/16; patient arrives with the wounds looking somewhat better especially on the right. Dr. Migdalia Dk change the dressing to silver alginate when she saw her on Monday and also sold her some compression socks. The usefulness of the latter is really not clear and woman with severely draining wounds. 04/10/16; the patient is doing a bit of an experiment wearing  the compression stockings that Dr. Migdalia Dk provided her to her left leg and the out of legs based dressings that we provided to the right. 05/01/16; the patient is continuing to wear compression stockings Dr. Migdalia Dk provided her on the left that are apparently silver impregnated. She has been using Iodoflex to the right leg wound. Still a moderate amount of drainage, when she leaves here the wraps only last for  4 days. She has to change the stocking on the left leg every night 05/15/16; she is now using compression stockings bilaterally provided by Dr. Marla Roe. She is wearing a nonadherent layer over the wounds so really I don't think there is anything specific being done to this now. She has some reduction on the left wound. The right is stable. I think all healing here is being done without a specific dressing 06/09/16; patient arrives here today with not much change in the wound certainly in diameter to large circular wounds over the medial aspect of her ankle bilaterally. Under the light of these services are certainly not viable for healing. There is no evidence of surrounding infection. She is wearing compression stockings with some sort of silver impregnation as prescribed by Dr. Marla Roe. She has a follow-up with her tomorrow. 06/30/16; no major change in the size or condition of her wounds. These are still probably covered with a nonviable surface. She is using only her purchase stockings. She did see Dr. Marla Roe who seemed to want to apply Dakin's solution to this I'm not extreme short what value this would be. I would suggest Iodoflex which she still has at home. 07/28/16; I follow Mrs. Krah episodically along with Dr. Marla Roe. 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 She has been applying a topical collagen ointment to both wounds with Adaptic. I don't think Dr. Marla Roe is planning to take her back to the OR. 08/19/16; I follow Mrs. Jeneen Rinks on a monthly basis along with Dr. Marla Roe of plastic surgery. 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 I been following her for a number of years. At one point I was able to get the right medial malleolus wound to heal and had the left medial malleolus down to about half its current size however and I had to send  her to plastic surgery for an operative debridement. Since then things have been stable to slightly improve the area on the right is slightly better one in the left about the same although there is much less adherent surface than I'm used to with this patient. She is using some form of liquid collagen gel that Dr. Marla Roe provided a Kerlix cover with the patient's own pressure stockings. She tells me that she has extreme pain in both ankles and along the lateral aspect of both feet. She has been unable to work for some period of time. She is telling me she is retiring at the beginning of April. She sees Dr. Doran Durand of orthopedics next week 09/22/16; patient has not seen Dr. Marla Roe since the last time she is here. I'm not really sure what she is using to the wounds other than bits and pieces of think she had left over including most recently Hydrofera Blue. She is using juxtalite stockings. She is having difficulty with her husband's recent illness "stroke". She is having to transport him to various doctors appointments. Dr. Marla Roe left her the option of a repeat debridement  with ACEL however she has not been able to get the time to follow-up on this. She continues to have a fair amount of drainage out of these wounds with certainly precludes leaving dressings on all week 10/13/16; patient has not seen Dr. Marla Roe since she was last in our clinic. I'm not really sure what she is doing with the wounds, we did try to get her [facility] and I think she is actually using this most of the time. Because of drainage she states she has to change this every second day although this is an improvement from what she used to do. She went to see Dr. Doran Durand who did not think she had a muscular issue with regards to her feet, he referred her to a neurologist and I think the appointment is sometime in June. I changed her back to Iodoflex which she has used in the past but not recently. 11/03/16; the patient  has been using Iodoflex although she ran out of this. Still claims that there is a lot of drainage although the wound does not look like this. No surrounding erythema. She has not been back to see Dr. Marla Roe 11/24/16; the patient has been using Iodoflex again but she ran out of it 2 or 3 days ago. There is no major change in the condition of either one of these wounds in fact they are larger and covered in a thick adherent surface slough/nonviable tissue especially on the left. She does not tolerate mechanical debridement in our clinic. Going back to see Dr. Marla Roe of plastic surgery for an operative debridement would seem reasonable. 12/15/16; the patient has not been back to see Dr. Marla Roe. She is been dealing with a series of illnesses and her husband which of monopolized her time. She is been using Sorbact which we largely supplied. She states the drainage is bad enough that it maximum she can go 2-3 days without changing the dressing 01/12/2017 -- the patient has not been back for about 4 weeks and has not seen Dr. Marla Roe not does she have any appointment pending. 01/23/17; patient has not seen Dr. Marla Roe even though I suggested this previously. She is using Santyl that was suggested last week by Dr. Con Memos this Cost her $16 through her insurance which is indeed surprising 02/12/17; continuing Santyl and the patient is changing this daily. A lot of drainage. She has not been back to see plastic surgery she is using an Ace wrap. Our intake nurse suggested wrap around stockings which would make a good reasonable alternative 02/26/17; patient is been using Santyl and changing this daily due to drainage. She has not been to see plastic surgery she uses in April Ace wrap to control the edema. She did obtain extremitease stockings but stated that the edema in her leg was to big for these 03/20/17; patient is using Santyl and Anasept. Surfaces looked better today the area on the right is  actually measuring a little smaller. She has states she has a lot of pain in her feet and ankles and is asking for a consult to pain control which I'll try to help her with through our case manager. 04/10/17; the patient arrives with better-looking wound surfaces and is slightly smaller wound on the left she is using a combination of Santyl and Anasept. She has an appointment or at least as started in the pain control center associated with [facility] 05/14/17; this is a patient who I followed for a prolonged period of time. She  has venous insufficiency ulcers on her bilateral medial ankles. At one point I had this down to a much smaller wound on the left however these reopened and we've never been able to get these to heal. She has been using Santyl and Anasept gel although 2 weeks ago she ran out of the Anasept gel. She has a stable appearance of the wound. She is going to the wound care clinic at [facility]. They wanted do a nerve block/spinal block although she tells me she is reluctant to go forward with that. 05/21/17; this is a patient I have followed for many years. 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 Chronic pain and deformity in her ankles as well. She is been to see plastic surgery as well as orthopedics. Using PolyMem AG most recently/Kerramax/ABDs and 2 layer compression. She has managed to keep this on and she is coming in for a nurse check to change the dressing on Tuesdays, we see her on Fridays 06/05/17; really quite a good looking surface and the area especially on the right medial has contracted in terms of dimensions. Well granulated healthy-looking tissue on both sides. Even with an open curet there is nothing that even feels abnormal here. This is as good as I've seen this in quite some time. We have been using PolyMem AG and bringing her in for a nurse check 06/12/17; really quite good surface on both of these wounds. The right medial has  contracted a bit left is not. We've been using PolyMem and AG and she is coming in for a nurse visit 06/19/17; we have been using PolyMem AG and bringing her in for a nurse check. Dimensions of her wounds are not better but the surfaces looked better bilaterally. She complained of bleeding last night and the left wound and increasing pain bilaterally. She states her wound pain is more neuropathic than just the wounds. There was some suggestion that this was radicular from her pain management doctor in talking to her it is really difficult to sort this out. 06/26/17; using PolyMem and AG and bringing her in for a nurse check as All of this and reasonably stable condition. Certainly not improved. The dimensions on the lateral part of the right leg look better but not really measuring better. The medial aspect on the left is about the same. 07/03/16; we have been using PolyMen AG and bringing her in for a nurse check to change the dressings as the wounds have drainage which precludes once weekly changing. We are using all secondary absorptive dressings.our intake nurse is brought up the idea of using a wound VAC/snap VAC on the wound to help with the drainage to see if this would result in some contraction. This is not a bad idea. The area on the right medial is actually looking smaller. Both wounds have a reasonable-looking surface. There is no evidence of cellulitis. The edema is well controlled 07/10/17; the patient was denied for a snap VAC by her insurance. The major issue with these wounds continues to be drainage. We are using wicked PolyMem AG and she is coming in for a nurse visit to change this. The wounds are stable to slightly improved. The surface looks vibrant and the area on the right certainly has shrunk in size but very slowly 07/17/17; the patient still has large wounds on her bilateral medial malleoli. Surface of both of these wounds looks better. The dimensions seem to come and go but no  consistent improvement.  There is no epithelialization. We do not have options for advanced treatment products due to insurance issues. They did not approve of the wound VAC to help control the drainage. More recently we've been using PolyMem and AG wicked to allow drainage through. We have been bringing her in for a nurse visit to change this. We do not have a lot of options for wound care products and the home again due to insurance issues 07/24/17; the patient's wound actually looks somewhat better today. No drainage measurements are smaller still healthy-looking surface. We used silver collagen under PolyMen started last week. We have been bringing her in for a dressing change 07/31/17; patient's wound surface continued to look better and I think there is visible change in the dimensions of the wound on the right. Rims of epithelialization. We have been using silver collagen under PolyMen and bringing her in for a dressing change. There appears to be less drainage although she is still in need of the dressing change 08/07/17. Patient's wound surface continues to look better on both sides and the area on the right is definitely smaller. We have been using silver collagen and PolyMen. She feels that the drainage has been it has been better. I asked her about her vascular status. She went to see Dr. Aleda Grana at [facility] and had some form of ablation. I don't have much detail on this. I haven't my notes from 2016 that she was not a candidate for any further ablation but I don't have any more information on this. We had referred her to vein and vascular I don't think she ever went. He does not have a history of PAD although I don't have any information on this either. We don't even have ABIs in our record 08/14/17; we've been using silver collagen and PolyMen cover. And putting the patient and compression. She we are bringing her in as a nurse visit to change this because ofarge amount of drainage. We  didn't the ABIs in clinic today since they had been done in many moons 1.2 bilaterally. She has been to see vein and vascular however this was at [facility] and she had ablation although I really don't have any information on this all seemed biking get a report. She is also been operatively debrided by plastic surgery and had a cell placed probably 8-12 months ago. This didn't have a major effect. We've been making some gains with current dressings 08/19/17-She is here in follow-up evaluation for bilateral medial malleoli ulcers. She continues to tolerate debridement very poorly. We will continue with recently changed topical treatment; if no significant improvement may consider switching to Iodosorb/Iodoflex. 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 She has been using silver collagen and PolyMem. I believe she has been used and tried on Iodoflex before. 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 These have been very refractory over time. We have been using silver collagen and PolyMen. She is coming in once a week for a doctor's and once a week for nurse visits. We are actually making some progress 09/18/17; the patient's wounds are smaller especially on the right medial. She arrives today to upset to consider even washing these off with Anasept which I think is been part of the reason this is been closing.  We've been using collagen covered in PolyMen otherwise. It is noted that she has a small area of folliculitis on the right medial calf that. As we are wrapping her legs I'll give her a short course of doxycycline to make sure this doesn't amount to anything. She is a long list of complaints today including imbalance, shortness of breath on exertion, inversion  of her left ankle. With regards to the latter complaints she is been to see orthopedics and they offered her a tendon release surgery I believe but wanted her wounds to be closed first. I have recommended she go see her primary physician with regards to everything else. 09/25/17; patient's wounds are about the same size. We have made some progress bilaterally although not in recent weeks. She will not allow me T wash these wounds with Anasept even if she is doing her cell. Wheeze we've been using collagen covered in PolyMen. Last week she had a small area of folliculitis this is now opened into a small wound. She completed 5 days of trimethoprim sulfamethoxazole 10/02/17; unfortunately the area on her left medial ankle is worse with a larger wound area towards the Achilles. The patient complains of a lot of pain. She will not allow debridement although visually I don't think there is anything to debridement in any case. We have been using silver collagen and PolyMen for several months now. Initially we are making some progress although I'm not really seeing that today. We will move back to [facility]. His admittedly this is a bit of a repeat however I'm hoping that his situation is different now. The patient tells me she had her leg on the left give out on her yesterday this is process some pain. 10/09/17; the patient is seen twice a week largely because of drainage issues coming out of the chronic medial bimalleolar wounds that are chronic. Last week the dimensions of the one on the left looks a little larger I changed her to [facility] She comes in today with a history of terrible pain in the bilateral wound areas. She will not allow debridement. She will not even allow a tissue culture. There is no surrounding erythema no no evidence of cellulitis. We have been putting her Kerlix Coban man. She will not allow more aggressive compression as there was a suggestion to put her in 3 layer  wraps. 10/16/17; large wounds on her bilateral medial malleoli. These are chronic. Not much change from last week. The surface looks have healthy but absolutely no epithelialization. A lot of pain little less so of drainage. She will not allow debridement or even washing these off in the vigorous fashion with Anasept. 10/23/17; large wounds on her bilateral malleoli which are chronic. Some improvement in terms of size perhaps on the right since last time I saw these. She states that after we increased the 3 layer compression there was some bleeding, when she came in for a nurse visit she did not want 3 layer compression put back on about our nurse managed to convince her. She has known chronic venous visit issues and I'm hoping to get her to tolerate the 3 layer compression. using Hydrofera Blue 10/30/17; absolutely no change in the condition of either wound although we've had some improvement in dimensions on the right.. Attempted to put her in 3 layer compression she didn't tolerated she is back in 2 layer compression. We've been using Hydrofera Blue We looked over her past records. She had venous reflux studies in  November 2016. There was no evidence of deep venous reflux on the right. Superficial vein did not show the greater saphenous vein at think this is been previously ablated the small saphenous vein was within normal limits. The left deep venous system showed no DVT the vessels were positive for deep venous reflux in the posterior tibial veins at the ankle. The greater saphenous vein was surgically absent small saphenous vein was within normal limits. She went to vein and vascular at [facility]. I believe she had an ablation on the left greater saphenous vein. I'll update her reflux studies perhaps ever reviewed by vein and vascular. We've made absolutely no progress in these wounds. Will also try to read and TheraSkins through her insurance 11/06/17; W the patient apparently has a 2 week  follow-up with vein and vascular I like him to review the whole issue with regards to her previous vascular workup by Dr. Aleda Grana. We've really made no progress on these wounds in many months. She arrives today with less viable looking surface on the left medial ankle wound. This was apparently looking about the same on Tuesday when she was here for nurse visit. 11/13/17; deep tissue culture I did last time of the left lower leg showed multiple organisms without any predominating. In particular no Staphylococcus or group A strep were isolated. We sent her for venous reflux studies. She's had a previous left greater saphenous vein stripping and I think sclerotherapy of the right greater saphenous vein. She didn't really look at the lesser saphenous vein this both wounds are on the medial aspect. She has reflux in the common femoral vein and popliteal vein and an accessory vein on the right and the common femoral vein and popliteal vein on the left. I'm going to have her go to see vein and vascular just the look over things and see if anything else beside aggressive compression is indicated here. We have not been able to make any progress on these wounds in spite of the fact that the surface of the wounds is never look too bad. 11/20/17; no major change in the condition of the wounds. Patient reports a large amount of drainage. She has a lot of complaints of pain although enlisting her today I wonder if some of this at least his neuropathic rather than secondary to her wounds. She has an appointment with vein and vascular on 12/30/17. The refractory nature of these wounds in my mind at least need vein and vascular to look over the wounds the recent reflux studies we did and her history to see if anything further can be done here. I also note her gait is deteriorated quite a bit. 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 She has a bilateral Trendelenburg gait. I wonder if this  is neuropathic or perhaps multilevel radicular. 11/27/17; her wounds actually looks slightly better. Healthy-looking granulation tissue a scant amount of epithelialization. [place] healthcare will not pay for [employer]. They will play for tri layer Oasis and Dermagraft. This is not a diabetic ulcer. We'll try for the tri layer Oasis. She still complains of some drainage. She has a vein and vascular appointment on 12/30/17 12/04/17; the wounds visually look quite good. Healthy-looking granulation with some degree of epithelialization. We are still waiting for response to our request for trial to try layer Oasis. Her appointment with vascular to review venous and arterial issues isn't sold the end of July 7/31. Not allow debridement or even vigorous cleansing of  the wound surface. 12/18/17; slightly smaller especially on the right. 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 We've been using Hydrofera Blue. We still are looking into triple layer Oasis through her insurance 01/08/18 on evaluation today patient's wound actually appears to be showing signs of good improvement at this point in time. She has been tolerating the dressing changes without complication. Fortunately there does not appear to be any evidence of infection at this point in time. We have been utilizing silver nitrate which does seem to be of benefit for her which is also good news. Overall I'm very happy with how things seem to be both regards appearance as well as measurement. Patient did see Dr. Bridgett Larsson for evaluation on 12/30/17. In his assessment he felt that stripping would not likely add much more than chronic compression to the patient's healing process. His recommendation was to follow-up in three months with Dr. Doren Custard if she hasn't healed in order to consider referral back to you and see vascular where she previously was in a trial and was able to get her wound to heal. I'll be see what she feels she when you  staying compression and he reiterated this as well. 01/13/18 on evaluation today patient appears to actually be doing very well in regard to her bilateral medial malleolus ulcers. She seems to have tolerated the chemical cauterization with silver nitrate last week she did have some pain through that evening but fortunately states that I'll be see since it seems to be doing better she is overall pleased with the progress. 01/21/18; really quite a remarkable improvement since I've last seen these wounds. We started using silver nitrate specially on the islands of hyper granulation which for some reason her around the wound circumference. This is really done quite nicely. Primary dressing Hydrofera Blue under 4 layer compression. She seems to be able to hold out without a nurse rewrap. Follow-up in 1 week 01/28/18; we've continued the hydrofera blue but continued with chemical cauterization to the wound area that we started about a month ago for irregular hyper granulation. She is made almost stunning improvement in the overall wound dimensions. I was not really expecting this degree of improvement in these chronic wounds 02/05/18; we continue with Hydrofera Bluebut of also continued the aggressive chemical cauterization with silver nitrate. We made nice progress with the right greater than left wound. 02/12/18. We continued with Hydrofera Blue after aggressive chemical cauterization with silver nitrate. We appear to be making nice progress with both wound areas 02/19/2018; we continue with [facility] after washing the wounds vigorously with Anasept spray and chemical cauterization with silver nitrate. We are making excellent progress. The area on the right's just about closed 02/26/2018. The area on the left medial ankle had too much necrotic debris today. I used a #5 curette we are able to get most of the soft. I continued with the silver nitrate to the much smaller wound on the right medial ankle she  had a new area on her right lower pretibial area which she says was due to a role in her compression 03/05/2018; both wound areas look healthy. Not much change in dimensions from last week. I continue to use silver nitrate and Hydrofera Blue. The patient saw Dr. Doren Custard of vein and vascular. He felt she had venous stasis ulcers. He felt based on her previous arteriogram she should have adequate circulation for healing. Also she has deep venous reflux but really no significant correctable superficial venous reflux at  this time. He felt we should continue with conservative management including leg elevation and compression 04/02/2018; since we last saw this woman about a month ago she had a fall apparently suffered a pelvic fracture. I did not look up the x-ray. Nevertheless because of pain she literally was bedbound for 2 weeks and had home health coming out to change the dressing. Somewhat predictably this is resulted in considerable improvement in both wound areas. The right is just about closed on the medial malleolus and the left is about half the size. 04/16/2018; both her wounds continue to go down in size. Using Hydrofera Blue. 05/07/18; both her wounds appeared to be improving especially on the right where it is almost closed. We are using Hydrofera Blue 05/14/2018; slightly worse this week with larger wounds. Surface on the left medial not quite as good. We have been using Hydrofera Blue 05/21/18; again the wounds are slightly larger. Left medial malleolus slightly larger with eschar around the circumference. We have been using Hydrofera Blue undergoing a wraps for a prolonged period of time. This got a lot better when she was more recumbent due to a fall and a back injury. I change the primary dressing the silver alginate today. She did not tolerate a 4 layer compression previously although I may need to bring this up with her next time 05/28/2018; area on the left medial malleolus again is  slightly larger with more drainage. Area on the right is roughly unchanged. She has a small area of folliculitis on the right medial just on the lower calf. This does not look ominous. 06/03/2018 left medial malleolus slightly smaller in a better looking surface. We used silver nitrate on this last time with silver alginate. The area on the right appears slightly smaller 1/10; left medial malleolus slightly smaller. Small open area on the right. We used silver nitrate and silver alginate as of 2 weeks ago. We continue with the wound and compression. These got a lot better when she was off her feet 1/17; right medial malleolus wound is smaller. The left may be slightly smaller. Both surfaces look somewhat better. 1/24; both wounds are slightly smaller. Using silver alginate under Unna boots 1/31; both wounds appear smaller in fact the area on the right medial is just about closed. Surface eschar. We have been using silver alginate under Unna boots. The patient is less active now spends let much less time on her feet and I think this is contributed to the general improvement in the wound condition 2/7; both wounds appear smaller. I was hopeful the right medial would be closed however there there is still the same small open area. Slight amount of surface eschar on the left the dimensions are smaller there is eschar but the wound edges appear to be free. We have been using silver alginate under Unna boot's 2/14; both wounds once again measure smaller. Circumferential eschar on the left medial. We have been using silver alginate under Unna boots with gradual improvement 2/21; the area on the right medial malleolus has healed. The area on the left is smaller. We have been using silver alginate and Unna boots. We can discharge wrapping the right leg she has 20/30 stockings at home she will need to protect the scar tissue in this area 2/28; the area on the right medial malleolus remains closed the patient has  a compression stocking. The area on the left is smaller. We have been using silver alginate and Unna boots. 3/6 the area on  the right medial ankle remains closed. Good edema control noted she is using her own compression stocking. The area on the left medial ankle is smaller. We have been managing this with silver alginate and Unna boots which we will continue today. 3/13; the area on the right medial ankle remains closed and I'm declaring it healed today. When necessary the left is about the same still a healthy-looking surface but no major change and wound area. No evidence of infection and using silver alginate under unna and generally making considerable improvement 3/27 the area on the right medial ankle remains closed the area on the left is about the same as last week. Certainly not any worse we have been using silver alginate under an Unna boot 4/3; the area on the right medial ankle remains closed per the patient. We did not look at this wound. The wound on the left medial ankle is about the same surface looks healthy we have been using silver alginate under an Unna boot 4/10; area on the right medial ankle remains closed per the patient. We did not look at this wound. The wound on the left medial ankle is slightly larger. The patient complains that the [facility] caused burning pain all week. She also told us that she was a lot more active this week. Changed her back to silver alginate 4/17; right medial ankle still closed per the patient. Left medial ankle is slightly larger. Using silver alginate. She did not tolerate Hydrofera Blue on this area 4/24; right medial ankle remains closed we have not look at this. The left medial ankle continues to get larger today by about a centimeter. We have been using silver alginate under Unna boots. She complains about 4 layer compression as an alternative. She has been up on her feet working on her garden 5/8; right medial ankle remains closed we  did not look at this. The left medial ankle has increased in size about 100%. We have been using silver alginate under Unna boots. She noted increased pain this week and was not surprised that the wound is deteriorated 5/15; no major change in SA however much less erythema ( one week of doxy ocellulitis). 5/22-65 year old female returns at 1 week to the clinic for left medial ankle wound for which we have been using silver alginate under 3 layer compression She was placed on DOXY at last visit - the wound is wider at this visit. She is in 3 layer compression 5/29; change to [facility] last week. I had given her empiric doxycycline 2 weeks ago for a week. She is in 3 layer compression. She complains of a lot of pain and drainage on presentation today. 6/5; using Hydrofera Blue. I gave her doxycycline recently empirically for erythema and pain around the wound. Believe her cultures showed enterococcus which not would not have been well covered by doxycycline nevertheless the wound looks better and I don't feel specifically that the enterococcus needs to be covered. She has a new what looks like a wrap injury on her lateral left ankle. 6/12; she is using Hydrofera Blue. She has a new area on the left anterior lower tibial area. This was a wrap injury last week. 6/19; the patient is using Hydrofera Blue. She arrived with marked inflammation and erythema around the wound and tenderness. 12/01/18 on evaluation today patient appears to be doing a little bit better based on what I'm hearing from the standpoint of lassos evaluation to this as far as the overall  appearance of the wound is concerned. Then sometime substandard she typically sees Dr. Dellia Nims. Nonetheless overall very pleased with the progress that she's made up to this point. No fevers, chills, nausea, or vomiting noted at this time. 7/10; some improvement in the surface area. Aggressively debrided last week apparently. I went ahead with the  debridement today although the patient does not tolerate this very well. We have been using Iodoflex. Still a fair amount of drainage 7/17; slightly smaller. Using Iodoflex. 7/24; no change from last week in terms of surface area. We have been using Iodoflex. Surface looks and continues to look somewhat better 7/31; surface area slightly smaller better looking surface. We have been using Iodoflex. This is under Unna boot compression 8/7-Patient presents at 1 week with Unna boot and Iodoflex, wound appears better 8/14-Patient presents at 1 week with Iodoflex, we use the Unna boot, wound appears to be stable better.Patient is getting Botox treatment for the inversion of the foot for tendon release, Next week 8/21; we are using Iodoflex. Unna boot. The wound is stable in terms of surface area. Under illumination there is some areas of the wound that appear to be either epithelialized or perhaps this is adherent slough at this point I was not really clear. It did not wipe off and I was reluctant to debride this today. 8/28; we are using Iodoflex in an Unna boot. Seems to be making good improvement. 9/4; using Iodoflex and wound is slightly smaller. 9/18; we are using Iodoflex with topical silver nitrate when she is here. The wound continues to be smaller 10/2; patient missed her appointment last week due to GI issues. She left and Iodoflex based dressing on for 2 weeks. Wound is about the same size about the size of a dime on the left medial lower 10/9 we have been using Iodoflex on the medial left ankle wound. She has a new superficial probable wrap injury on the dorsal left ankle 10/16; we have been using Hydrofera Blue since last week. This is on the left medial ankle 10/23; we have been using Hydrofera Blue since 2 weeks ago. This is on the left medial ankle. Dimensions are better 11/6; using Hydrofera Blue. I think the wound is smaller but still not closed. Left medial ankle 11/13; we have been  using Hydrofera Blue. Wound is certainly no smaller this week. Also the surface not as good. This is the remanent of a very large area on her left medial ankle. 11/20; using Sorbact since last week. Wound was about the same in terms of size although I was disappointed about the surface debris 12/11; 3-week follow-up. Patient was on vacation. Wound is measuring slightly larger we have been using Sorbact. 12/18; wound is about the same size however surface looks better last week after debridement. We have been using Sorbact under compression 1/15 wound is probably twice the size of last time increased in length nonviable surface. We have been using Sorbact. She was running a mild fever and missed her appointment last week 1/22; the wound is come down in size but under illumination still a very adherent debris we have been Hydrofera Blue that I changed her to last week 1/29; dimensions down slightly. We have been using Hydrofera Blue 2/19 dimensions are the same however there is rims of epithelialization under illumination. Therefore more the surface area may be epithelialized 2/26; the patient's wound actually measures smaller. The wound looks healthy. We have been using Hydrofera Blue. I had some thoughts about  running Apligraf then I still may do that however this looks so much better this week we will delay that for now 3/5; the wound is small but about the same as last week. We have been using Hydrofera Blue. No debridement is required today. 3/19; the wound is about the size of a dime. Healthy looking wound even under illumination. We have been using Hydrofera Blue. No mechanical debridement is necessary 3/26; not much change from last week although still looks very healthy. We have been using Hydrofera Blue under Unna boots Patient was offered an ankle fusion by podiatry but not until the wound heals with a proceed with this. 4/9; the patient comes in today with her original wound on the medial  ankle looking satisfactory however she has some uncontrolled swelling in the middle part of her leg with 2 new open areas superiorly just lateral to the tibia. I think this was probably a wrap issue. She said she felt uncomfortable during the week but did not call in. We have been using Hydrofera Blue 4/16; the wound on the medial ankle is about the same. She has innumerable small areas superior to this across her mid tibia. I think this is probably folliculitis. She is also been working in the yard doing a lot of sweating 4/30; the patient issue on the upper areas across her mid tibia of all healed. I think this was excessive yard work if I remember. Her wound on the medial ankle is smaller. Some debris on this we have been using Hydrofera Blue under Unna boots 5/7; mid tibia. She has been using Hydrofera Blue under an Unna wrap. She is apparently going for her ankle surgery on June 3 10/28/19-Patient returns to clinic with the ankle wound, we are using Hydrofera Blue under Unna wrap, surgery is scheduled for her left foot for June 3 so she will be back for nurse visit next week READMISSION 01/17/2020 Mrs. Wiesen is a 65 year old woman we have had in this clinic for a long period of time with severe venous hypertension and refractory wounds on her medial lower legs and ankles bilaterally. 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 When she was off her legs for a prolonged period example when she fell and suffered a compression fracture things would heal up quite nicely. She is now retired and we managed to heal up the right medial leg wound. The left one was very tiny last time I saw this although still refractory. 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 It got to the point where this was  interfering with her walking and she elected to proceed with a ankle arthrodesis to straighten her her ankle and leave her with a functional outcome for mobilization. The patient was referred to Dr. Doren Custard and really this took some time to arrange. Dr. Doren Custard saw her on 12/07/2019. Once again he verified that she had no arterial issues. She had previously had an angiogram several years ago. Follow-up ABIs on the left showed an ABI of 1.12 with triphasic waveforms and a TBI of 0.92. She is felt to have chronic deep venous insufficiency but I do not think it was felt that anything could be done from about this from an ablation point of view. At the time Dr. Doren Custard saw  this patient the wounds actually look closed via the pictures in his clinic. The patient finally underwent her surgery on 12/15/2019. This went reasonably well and there was a good anatomic outcome. She developed a small distal wound dehiscence on the lateral part of the surgical wound. However more problematically she is developed recurrence of the wound on the medial left ankle. There are actually 2 wounds here one in the distal lower leg and 1 pretty much at the level of the medial malleolus. It is a more distal area that is more problematic. She has been using Hydrofera Blue which started on Friday before this she was simply Ace wrapping. There was a culture done that showed Pseudomonas and she is on ciprofloxacin. A recent CNS on 8/11 was negative. The patient reports some pain but I generally think this is improving. She is using a cam boot completely nonweightbearing using a walker for pivot transfers and a wheelchair 8/24; not much improvement unfortunately she has a surgical wound on the lateral part in the venous insufficiency wound medially. The bottom part of the medial insufficiency wound is still necrotic there is exposed tendon here. We have been using Hydrofera Blue under compression. Her edema control is however better 8/31;  patient in for follow-up of his surgical wound on the lateral part of her left leg and chronic venous insufficiency ulcers medially. We put her back in compression last week. She comes in today with a complaint of 3 or 4 days worth of increasing pain. She felt her cam walker was rubbing on the area on the back of her heel. However there is intense erythema seems more likely she has cellulitis. She had 2 cultures done when she was seeing podiatry in the postop. One of them in late July showed Pseudomonas and she received a course of ciprofloxacin the other was negative on 8/11 she is allergic to penicillin with anaphylactoid complaints of hives oral swelling via information in epic 9/9; when I saw this patient last week she had intense anterior erythema around her wound on the right lateral heel and ankle and also into the right medial heel. Some of this was no doubt drainage and her walker boot however I was convinced she had cellulitis. I gave her Levaquin and Bactrim she is finishing up on this now. She is following up with Dr. [name] he saw her yesterday. He is taken her out of the walking boot of course she is still nonweightbearing. Her x-ray was negative for any worrisome features such as soft tissue air etc. Things are a lot better this week. She has home health. We have been using Hydrofera Blue under an [employer] which she put back on yesterday. I did not wrap her last week 9/17; her surrounding skin looks a lot better. In fact the area on the left lateral ankle has just a scant amount of eschar. The only remaining wound is the large area on the left medial ankle. Probably about 60% of this is healthy granulation at the surface however she has a significant divot distally. This has adherent debris in it. I been using debridement and silver collagen to try and get this area to fill-in although I do not think we have made much progress this week 9/24; the patient's wound on the left medial ankle  looks a lot better. The deeper divot area distally still requires debridement but this is cleaning up quite nicely we have been using silver collagen. The patient is complaining of swelling  in her foot and is worried that that is contributing to the nonhealing of the ankle wound. She is also complaining of numbness in her anterior toes 10/4; left medial ankle. The small area distally still has a divot with necrotic material that I have been debriding away. This has an undermining area. She is approved for Apligraf. She saw Dr. [name] her surgeon on 10/1. I think he declared himself is satisfied with the condition of things. Still nonweightbearing till the end of the month. 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 Her surgical wound is well closed. There is no evidence of infection 10/11; the patient arrived in clinic today with the expectation that we be able to put an Apligraf on this area after debridement however she arrives with a relatively large amount of green drainage on the dressing. The patient states that this started on Friday. She has not been systemically unwell. 10/19; culture I did last week showed both Enterococcus and Pseudomonas. I think this came in separate parts because I stopped her ciprofloxacin I gave her and prescribed her linezolid however now looking at the final culture result this is Pseudomonas which is resistant to quinolones. She has not yet picked up the linezolid apparently phone issues. We are also trying to get a topical antibiotic out of [place] in [place] they can be applied by home health. She is still having green drainage 10/16; the patient has her topical antibiotic from [facility] in [place]. This is a compounded gel with vancomycin and ciprofloxacin and gentamicin. We are applying this on the wound bed with silver alginate over the top with Unna boot wraps. She arrives in clinic today with a lot less ominous  looking drainage although she is only use this topical preparation once the second time today. She sees Dr. [name] her surgeon on Friday she has home health changing the dressing 11/2; still using her compounded topical antibiotic under silver alginate. Surface is cleaning up there is less drainage. We had an Apligraf for her today and I elected to apply it. A light coating of her antibiotic 04/25/2020 upon evaluation today patient appears to be doing well with regard to her ankle ulcer. There is a little bit of slough noted on the surface of the wound I am can have to perform sharp debridement to clear this away today. With that being said other than that fact overall I feel like she is making progress and we do see some new epithelial growth. There is also some improvement in the depth of the wound and that distal portion. There is little bit of slough there as well. 12/7; 2-week follow-up. Apligraf #3. Dimensions are smaller. Closing in especially inferiorly. Still some surface debris. Still using the [facility] topical antibiotic but I told her that I don't think this needs to be renewed 12/21; 2-week follow-up. Apligraf #4 dimensions are smaller. Nice improvement 06/05/2020; 2-week follow-up. The patient's wound on the left medial ankle looks really excellent. Nice granulation. Advancing epithelialization no undermining no evidence of infection. We would have to reapply for another Apligraf but with the condition of this wound I did not feel strongly about it. We used Hydrofera Blue under the same degree of compression. She follows up with Dr. [name] her surgeon a week Friday 06/13/2020 upon evaluation today patient appears to be doing excellent in regard to her wound. She has been tolerating the dressing changes without complication. Fortunately there is no signs of active infection  at this time. No fevers, chills, nausea, vomiting, or diarrhea. She was using Hydrofera Blue  last week. Objective Constitutional Well-nourished and well-hydrated in no acute distress. Vitals Time Taken: 1:05 PM, Height: 68 in, Temperature: 97.4 F, Pulse: 57 bpm, Respiratory Rate: 16 breaths/min, Blood Pressure: 134/75 mmHg. Respiratory normal breathing without difficulty. Psychiatric this patient is able to make decisions and demonstrates good insight into disease process. Alert and Oriented x 3. pleasant and cooperative. General Notes: Upon inspection patient's wound bed actually showed signs of good granulation at this time. I think she may benefit from the use of plain collagen which I think would be best at this point in time. That may feel better as well than the [facility]. Integumentary (Hair, Skin) Wound #15 status is Open. Original cause of wound was Gradually Appeared. The wound is located on the Left,Medial Malleolus. The wound measures 1.7cm length x 1cm width x 0.1cm depth; 1.335cm^2 area and 0.134cm^3 volume. There is Fat Layer (Subcutaneous Tissue) exposed. There is no tunneling or undermining noted. There is a medium amount of serosanguineous drainage noted. The wound margin is flat and intact. There is large (67-100%) red granulation within the wound bed. There is no necrotic tissue within the wound bed. Assessment Active Problems ICD-10 Chronic venous hypertension (idiopathic) with ulcer and inflammation of left lower extremity Non-pressure chronic ulcer of other part of left lower leg with other specified severity Non-pressure chronic ulcer of left ankle with other specified severity Procedures Wound #15 Pre-procedure diagnosis of Wound #15 is a Venous Leg Ulcer located on the Left,Medial Malleolus . There was a Haematologist Compression Therapy Procedure by Deon Pilling, RN. Post procedure Diagnosis Wound #15: Same as Pre-Procedure Plan Follow-up Appointments: Return Appointment in 1 week. - Tues. with Dr. Dellia Nims Cellular or Tissue Based  Products: Cellular or Tissue Based Product Type: - Re-run IVR for apligraf Bathing/ Shower/ Hygiene: May shower with protection but do not get wound dressing(s) wet. Edema Control - Lymphedema / SCD / Other: Elevate legs to the level of the heart or above for 30 minutes daily and/or when sitting, a frequency of: Avoid standing for long periods of time. Exercise regularly Compression stocking or Garment 20-30 mm/Hg pressure to: - right leg daily WOUND #15: - Malleolus Wound Laterality: Left, Medial Cleanser: Soap and Water 1 x Per Week/15 Days Discharge Instructions: May shower and wash wound with dial antibacterial soap and water prior to dressing change. Peri-Wound Care: Triamcinolone 15 (g) 1 x Per Week/15 Days Discharge Instructions: Use triamcinolone 15 (g) as directed Peri-Wound Care: Sween Lotion (Moisturizing lotion) 1 x Per Week/15 Days Discharge Instructions: Apply moisturizing lotion as directed Prim Dressing: Promogran Prisma Matrix, 4.34 (sq in) (silver collagen) 1 x Per Week/15 Days ary Discharge Instructions: Moisten collagen with saline or hydrogel Secondary Dressing: Woven Gauze Sponge, Non-Sterile 4x4 in (Home Health) 1 x Per Week/15 Days Discharge Instructions: Apply over primary dressing as directed. Secondary Dressing: ABD Pad, 5x9 (Home Health) 1 x Per Week/15 Days Discharge Instructions: Apply over primary dressing as directed. Com pression Wrap: Unnaboot w/Calamine, 4x10 (in/yd) (Home Health) 1 x Per Week/15 Days Discharge Instructions: Apply Unnaboot as directed. 1. Would recommend at this time that we have the patient go ahead and initiate treatment with plain collagen I think this would be a good option for her and will help to grow new epithelial tissue quite readily. 2. I also can recommend at this time that the patient still proceed with Apligraf approval if we gain  this approval we can continue using that as it is done excellent for up to this point. 3.  Muscle can recommend that we continue with the Unna boot wrap that does seem to been beneficial at this time. She does have an appointment with Dr. [name] at the end of the week. She did have some question about pain she was having along with the lateral portion of her ankle where she has a plate and she is can asked Dr. [name] about this. It did not appear to be anything too severe at this point. We will see patient back for reevaluation in 1 week here in the clinic. If anything worsens or changes patient will contact our office for additional recommendations. Electronic Signature(s) Signed: 06/13/2020 2:03:22 PM By: [name] PA-C Entered By: [name] on 06/13/2020 14:03:21 -------------------------------------------------------------------------------- SuperBill Details Patient Name: Date of Service: [name], [name] 06/13/2020 Medical Record Number: [id number] Patient Account Number: [id number] Date of Birth/Sex: Treating RN: 09-Feb-1956 (65 y.o. [name] Primary Care Provider: [name] Other Clinician: Referring Provider: Treating Provider/Extender: [name] in Treatment: 21 Diagnosis Coding ICD-10 Codes Code Description [phone] Chronic venous hypertension (idiopathic) with ulcer and inflammation of left lower extremity L97.828 Non-pressure chronic ulcer of other part of left lower leg with other specified severity L97.328 Non-pressure chronic ulcer of left ankle with other specified severity Facility Procedures CPT4 Code: [id number] Description: (Facility Use Only) 29580LT - APPLY UNNA BOOT LT Modifier: Quantity: 1 Physician Procedures : CPT4 Code Description Modifier [id number] - WC PHYS LEVEL 3 - EST PT ICD-10 Diagnosis Description I87.332 Chronic venous hypertension (idiopathic) with ulcer and inflammation of left lower extremity L97.828 Non-pressure chronic ulcer of other part  of left lower leg with other specified  severity L97.328 Non-pressure chronic ulcer of left ankle with other specified severity Quantity: 1 Electronic Signature(s) Signed: 06/13/2020 2:03:43 PM By: [name] PA-C Entered By: [name] on 06/13/2020 14:03:42

## 2020-06-14 NOTE — Progress Notes (Signed)
[name], [name] ([id number]) Visit Report for 06/13/2020 Arrival Information Details Patient Name: Date of Service: [facility], [name] 06/13/2020 1:00 PM Medical Record Number: [id number] Patient Account Number: [id number] Date of Birth/Sex: Treating RN: 26-Jul-1955 (65 y.o. [name] Primary Care Jamika Sadek: [name] Other Clinician: Referring Leroy Pettway: Treating Angeligue Bowne/Extender: [name] in Treatment: 21 Visit Information History Since Last Visit Added or deleted any medications: No Patient Arrived: Cane Any new allergies or adverse reactions: No Arrival Time: 13:03 Had a fall or experienced change in No Accompanied By: alone activities of daily living that may affect Transfer Assistance: None risk of falls: Patient Identification Verified: Yes Signs or symptoms of abuse/neglect since last visito No Secondary Verification Process Completed: Yes Hospitalized since last visit: No Patient Requires Transmission-Based Precautions: No Implantable device outside of the clinic excluding No Patient Has Alerts: Yes cellular tissue based products placed in the center Patient Alerts: L ABI =1.12, TBI = .92 since last visit: R ABI= 1.02, TBI= .58 Has Dressing in Place as Prescribed: Yes Has Compression in Place as Prescribed: Yes Pain Present Now: No Electronic Signature(s) Signed: 06/14/2020 5:28:50 PM By: Levan Hurst RN, BSN Entered By: Levan Hurst on 06/13/2020 13:05:24 -------------------------------------------------------------------------------- Compression Therapy Details Patient Name: Date of Service: [name], [name] 06/13/2020 1:00 PM Medical Record Number: [id number] Patient Account Number: [id number] Date of Birth/Sex: Treating RN: 10/28/1955 (65 y.o. [name] Ko Bardon: [name] Other Clinician: Referring Emmelina Mcloughlin: Treating Shammond Arave/Extender: [name] in Treatment:  21 Compression Therapy Performed for Wound Assessment: Wound #15 Left,Medial Malleolus Performed By: Clinician Deon Pilling, RN Compression Type: Rolena Infante Post Procedure Diagnosis Same as Pre-procedure Electronic Signature(s) Signed: 06/13/2020 5:15:42 PM By: Baruch Gouty RN, BSN Entered By: Baruch Gouty on 06/13/2020 13:33:56 -------------------------------------------------------------------------------- Encounter Discharge Information Details Patient Name: Date of Service: [name], [name] 06/13/2020 1:00 PM Medical Record Number: [id number] Patient Account Number: [id number] Date of Birth/Sex: Treating RN: Apr 02, 1956 (65 y.o. [name] Primary Care Kendricks Reap: [name] Other Clinician: Referring Tashema Tiller: Treating Tamee Battin/Extender: [name] in Treatment: 21 Encounter Discharge Information Items Discharge Condition: Stable Ambulatory Status: Cane Discharge Destination: Home Transportation: Private Auto Accompanied By: self Schedule Follow-up Appointment: Yes Clinical Summary of Care: Electronic Signature(s) Signed: 06/13/2020 4:35:23 PM By: Deon Pilling Entered By: Deon Pilling on 06/13/2020 14:00:44 -------------------------------------------------------------------------------- Lower Extremity Assessment Details Patient Name: Date of Service: [facility] [name], [name] 06/13/2020 1:00 PM Medical Record Number: [id number] Patient Account Number: [id number] Date of Birth/Sex: Treating RN: 10/23/1955 (65 y.o. [name] Primary Care Pope Brunty: [name] Other Clinician: Referring Nakya Weyand: Treating Raelynne Ludwick/Extender: [name] Weeks in Treatment: 21 Edema Assessment Assessed: [Left: No] [Right: No] Edema: [Left: N] [Right: o] Calf Left: Right: Point of Measurement: From Medial Instep 27 cm Ankle Left: Right: Point of Measurement: From Medial Instep 20 cm Vascular  Assessment Pulses: Dorsalis Pedis Palpable: [Left:Yes] Electronic Signature(s) Signed: 06/14/2020 5:28:50 PM By: Levan Hurst RN, BSN Entered By: Levan Hurst on 06/13/2020 13:14:22 -------------------------------------------------------------------------------- [place] Details Patient Name: Date of Service: [name], [name] 06/13/2020 1:00 PM Medical Record Number: [id number] Patient Account Number: [id number] Date of Birth/Sex: Treating RN: August 26, 1955 (65 y.o. [name] Aylin Rhoads: [name] Other Clinician: Referring Cadarius Nevares: Treating Lakelyn Straus/Extender: [name] in Treatment: 21 Active Inactive Wound/Skin Impairment Nursing Diagnoses: Impaired tissue  integrity Knowledge deficit related to ulceration/compromised skin integrity Goals: Patient/caregiver will verbalize understanding of skin care regimen Date Initiated: 01/17/2020 Target Resolution Date: 07/11/2020 Goal Status: Active Ulcer/skin breakdown will have a volume reduction of 30% by week 4 Date Initiated: 01/17/2020 Date Inactivated: 03/12/2020 Target Resolution Date: 03/09/2020 Goal Status: Met Interventions: Assess patient/caregiver ability to obtain necessary supplies Assess patient/caregiver ability to perform ulcer/skin care regimen upon admission and as needed Assess ulceration(s) every visit Provide education on ulcer and skin care Notes: Electronic Signature(s) Signed: 06/13/2020 5:15:42 PM By: Baruch Gouty RN, BSN Entered By: Baruch Gouty on 06/13/2020 13:32:00 -------------------------------------------------------------------------------- Pain Assessment Details Patient Name: Date of Service: [name], [name] 06/13/2020 1:00 PM Medical Record Number: [id number] Patient Account Number: [id number] Date of Birth/Sex: Treating RN: 05-20-1956 (65 y.o. [name] Primary Care Cristine Daw: [name] Other  Clinician: Referring Carloyn Lahue: Treating Yianna Tersigni/Extender: [name] in Treatment: 21 Active Problems Location of Pain Severity and Description of Pain Patient Has Paino No Site Locations Pain Management and Medication Current Pain Management: Electronic Signature(s) Signed: 06/14/2020 5:28:50 PM By: Levan Hurst RN, BSN Entered By: Levan Hurst on 06/13/2020 13:05:51 -------------------------------------------------------------------------------- Patient/Caregiver Education Details Patient Name: Date of Service: [name], [name] 1/12/2022andnbsp1:00 PM Medical Record Number: [id number] Patient Account Number: [id number] Date of Birth/Gender: Treating RN: 11/02/1955 (65 y.o. [name] Physician: [name] Other Clinician: Referring Physician: Treating Physician/Extender: [name] in Treatment: 21 Education Assessment Education Provided To: Patient Education Topics Provided Venous: Methods: Explain/Verbal Responses: Reinforcements needed, State content correctly Electronic Signature(s) Signed: 06/13/2020 5:15:42 PM By: Baruch Gouty RN, BSN Entered By: Baruch Gouty on 06/13/2020 13:32:22 -------------------------------------------------------------------------------- Wound Assessment Details Patient Name: Date of Service: [name], [name] 06/13/2020 1:00 PM Medical Record Number: [id number] Patient Account Number: [id number] Date of Birth/Sex: Treating RN: 1955/06/25 (65 y.o. [name] Primary Care Jaylina Ramdass: [name] Other Clinician: Referring Dequavious Harshberger: Treating Alexander Aument/Extender: [name] Weeks in Treatment: 21 Wound Status Wound Number: 15 Primary Venous Leg Ulcer Etiology: Wound Location: Left, Medial Malleolus Wound Status: Open Wounding Event: Gradually Appeared Comorbid Peripheral Venous Disease, Osteoarthritis,  Confinement Date Acquired: 12/30/2019 History: Anxiety Weeks Of Treatment: 21 Clustered Wound: No Photos Photo Uploaded By: Mikeal Hawthorne on 06/14/2020 13:07:44 Wound Measurements Length: (cm) 1.7 Width: (cm) 1 Depth: (cm) 0.1 Area: (cm) 1.335 Volume: (cm) 0.134 % Reduction in Area: 88.6% % Reduction in Volume: 98.6% Epithelialization: Medium (34-66%) Tunneling: No Undermining: No Wound Description Classification: Full Thickness With Exposed Support Structures Wound Margin: Flat and Intact Exudate Amount: Medium Exudate Type: Serosanguineous Exudate Color: red, brown Foul Odor After Cleansing: No Slough/Fibrino No Wound Bed Granulation Amount: Large (67-100%) Exposed Structure Granulation Quality: Red Fascia Exposed: No Necrotic Amount: None Present (0%) Fat Layer (Subcutaneous Tissue) Exposed: Yes Tendon Exposed: No Muscle Exposed: No Joint Exposed: No Bone Exposed: No Treatment Notes Wound #15 (Malleolus) Wound Laterality: Left, Medial Cleanser Soap and Water Discharge Instruction: May shower and wash wound with dial antibacterial soap and water prior to dressing change. Peri-Wound Care Triamcinolone 15 (g) Discharge Instruction: Use triamcinolone 15 (g) as directed Sween Lotion (Moisturizing lotion) Discharge Instruction: Apply moisturizing lotion as directed Topical Primary Dressing Promogran Prisma Matrix, 4.34 (sq in) (silver collagen) Discharge Instruction: Moisten collagen with saline or hydrogel Secondary Dressing Woven Gauze Sponge, Non-Sterile 4x4 in Discharge Instruction: Apply over primary dressing as directed. ABD Pad, 5x9 Discharge Instruction: Apply over primary  dressing as directed. Secured With Compression Wrap Unnaboot w/Calamine, 4x10 (in/yd) Discharge Instruction: Apply Unnaboot as directed. Compression Stockings Add-Ons Electronic Signature(s) Signed: 06/14/2020 5:28:50 PM By: Levan Hurst RN, BSN Entered By: Levan Hurst on  06/13/2020 13:13:57 -------------------------------------------------------------------------------- Vitals Details Patient Name: Date of Service: [name], [name] 06/13/2020 1:00 PM Medical Record Number: [id number] Patient Account Number: [id number] Date of Birth/Sex: Treating RN: Jun 12, 1955 (65 y.o. [name] Primary Care Karrigan Messamore: [name] Other Clinician: Referring Kemar Pandit: Treating Beya Tipps/Extender: [name] in Treatment: 21 Vital Signs Time Taken: 13:05 Temperature (F): 97.4 Height (in): 68 Pulse (bpm): 57 Respiratory Rate (breaths/min): 16 Blood Pressure (mmHg): 134/75 Reference Range: 80 - 120 mg / dl Electronic Signature(s) Signed: 06/14/2020 5:28:50 PM By: Levan Hurst RN, BSN Entered By: Levan Hurst on 06/13/2020 13:05:44

## 2020-06-15 ENCOUNTER — Ambulatory Visit (INDEPENDENT_AMBULATORY_CARE_PROVIDER_SITE_OTHER): Payer: Medicare Other

## 2020-06-15 ENCOUNTER — Encounter: Payer: Self-pay | Admitting: Podiatry

## 2020-06-15 ENCOUNTER — Ambulatory Visit (INDEPENDENT_AMBULATORY_CARE_PROVIDER_SITE_OTHER): Payer: Medicare Other | Admitting: Podiatry

## 2020-06-15 ENCOUNTER — Other Ambulatory Visit: Payer: Self-pay

## 2020-06-15 DIAGNOSIS — L97322 Non-pressure chronic ulcer of left ankle with fat layer exposed: Secondary | ICD-10-CM

## 2020-06-15 DIAGNOSIS — Z9889 Other specified postprocedural states: Secondary | ICD-10-CM

## 2020-06-15 DIAGNOSIS — M21542 Acquired clubfoot, left foot: Secondary | ICD-10-CM | POA: Diagnosis not present

## 2020-06-19 ENCOUNTER — Encounter (HOSPITAL_BASED_OUTPATIENT_CLINIC_OR_DEPARTMENT_OTHER): Payer: Medicare Other | Admitting: Internal Medicine

## 2020-06-20 ENCOUNTER — Other Ambulatory Visit: Payer: Self-pay

## 2020-06-20 ENCOUNTER — Encounter (HOSPITAL_BASED_OUTPATIENT_CLINIC_OR_DEPARTMENT_OTHER): Payer: Medicare Other | Admitting: Physician Assistant

## 2020-06-20 DIAGNOSIS — I87332 Chronic venous hypertension (idiopathic) with ulcer and inflammation of left lower extremity: Secondary | ICD-10-CM | POA: Diagnosis not present

## 2020-06-20 NOTE — Progress Notes (Signed)
[name], [name] ([id number]) Visit Report for 06/20/2020 Arrival Information Details Patient Name: Date of Service: [place] [name], [name] 06/20/2020 12:45 PM Medical Record Number: [id number] Patient Account Number: [id number] Date of Birth/Sex: Treating RN: 1955-06-11 (65 y.o. [name], [name] Primary Care Olina Melfi: [name] Other Clinician: Referring Latica Hohmann: Treating Zakariah Dejarnette/Extender: [name] in Treatment: 22 Visit Information History Since Last Visit Added or deleted any medications: No Patient Arrived: Ambulatory Any new allergies or adverse reactions: No Arrival Time: 12:55 Had a fall or experienced change in No Accompanied By: self activities of daily living that may affect Transfer Assistance: None risk of falls: Patient Identification Verified: Yes Signs or symptoms of abuse/neglect since last visito No Secondary Verification Process Completed: Yes Hospitalized since last visit: No Patient Requires Transmission-Based Precautions: No Implantable device outside of the clinic excluding No Patient Has Alerts: Yes cellular tissue based products placed in the center Patient Alerts: L ABI =1.12, TBI = .92 since last visit: R ABI= 1.02, TBI= .58 Has Dressing in Place as Prescribed: Yes Pain Present Now: No Electronic Signature(s) Signed: 06/20/2020 5:25:13 PM By: [name] RN Entered By: [name] on 06/20/2020 12:56:04 -------------------------------------------------------------------------------- Compression Therapy Details Patient Name: Date of Service: [name], [name] 06/20/2020 12:45 PM Medical Record Number: [id number] Patient Account Number: [id number] Date of Birth/Sex: Treating RN: Jan 03, 1956 (65 y.o. [name] Primary Care Breda Bond: [name] Other Clinician: Referring Salote Weidmann: Treating Laden Fieldhouse/Extender: [name] in Treatment: 22 Compression Therapy Performed  for Wound Assessment: Wound #15 Left,Medial Malleolus Performed By: Clinician [name], RN Compression Type: Rolena Infante Post Procedure Diagnosis Same as Pre-procedure Electronic Signature(s) Signed: 06/20/2020 5:51:30 PM By: Baruch Gouty RN, BSN Entered By: Baruch Gouty on 06/20/2020 13:39:26 -------------------------------------------------------------------------------- Encounter Discharge Information Details Patient Name: Date of Service: [name], [name] 06/20/2020 12:45 PM Medical Record Number: [id number] Patient Account Number: [id number] Date of Birth/Sex: Treating RN: [birth year] (65 y.o. [name], [name] Primary Care Audrey Thull: [name] Other Clinician: Referring Mylani Gentry: Treating Quiera Diffee/Extender: [name] in Treatment: 22 Encounter Discharge Information Items Discharge Condition: Stable Ambulatory Status: Wheelchair Discharge Destination: Skilled Nursing Facility Telephoned: No Orders Sent: Yes Transportation: Other Accompanied By: self Schedule Follow-up Appointment: Yes Clinical Summary of Care: Patient Declined Electronic Signature(s) Signed: 06/20/2020 5:25:13 PM By: [name] RN Entered By: [name] on 06/20/2020 13:51:04 -------------------------------------------------------------------------------- Lower Extremity Assessment Details Patient Name: Date of Service: [facility] [name], [name] Kingfisher G. 06/20/2020 12:45 PM Medical Record Number: [id number] Patient Account Number: [id number] Date of Birth/Sex: Treating RN: May 31, 1956 (65 y.o. [name], [name] Primary Care Crystalyn Delia: [name] Other Clinician: Referring Robertlee Rogacki: Treating Lanorris Kalisz/Extender: [name] Weeks in Treatment: 22 Edema Assessment Assessed: [Left: No] [Right: No] Edema: [Left: N] [Right: o] Calf Left: Right: Point of Measurement: From Medial Instep 26.5 cm Ankle Left: Right: Point of  Measurement: From Medial Instep 20.8 cm Vascular Assessment Pulses: Dorsalis Pedis Palpable: [Left:Yes] Posterior Tibial Palpable: [Left:Yes] Electronic Signature(s) Signed: 06/20/2020 5:25:13 PM By: [name] RN Entered By: [name] on 06/20/2020 12:59:02 -------------------------------------------------------------------------------- [place] Details Patient Name: Date of Service: [facility] [name], [name] 06/20/2020 12:45 PM Medical Record Number: [id number] Patient Account Number: [id number] Date of Birth/Sex: Treating RN: [birth year] (65 y.o. [name] Primary Care [name]: [name] Other Clinician: Referring Verla Bryngelson: Treating Lylian Sanagustin/Extender: [name] in Treatment: 22 Active Inactive Venous Leg  Ulcer Nursing Diagnoses: Actual venous Insuffiency (use after diagnosis is confirmed) Knowledge deficit related to disease process and management Goals: Patient will maintain optimal edema control Date Initiated: 06/20/2020 Target Resolution Date: 07/18/2020 Goal Status: Active Interventions: Assess peripheral edema status every visit. Compression as ordered Treatment Activities: Therapeutic compression applied : 06/20/2020 Notes: Wound/Skin Impairment Nursing Diagnoses: Impaired tissue integrity Knowledge deficit related to ulceration/compromised skin integrity Goals: Patient/caregiver will verbalize understanding of skin care regimen Date Initiated: 01/17/2020 Target Resolution Date: 07/11/2020 Goal Status: Active Ulcer/skin breakdown will have a volume reduction of 30% by week 4 Date Initiated: 01/17/2020 Date Inactivated: 03/12/2020 Target Resolution Date: 03/09/2020 Goal Status: Met Interventions: Assess patient/caregiver ability to obtain necessary supplies Assess patient/caregiver ability to perform ulcer/skin care regimen upon admission and as needed Assess ulceration(s) every visit Provide  education on ulcer and skin care Notes: Electronic Signature(s) Signed: 06/20/2020 5:51:30 PM By: Baruch Gouty RN, BSN Entered By: Baruch Gouty on 06/20/2020 13:37:24 -------------------------------------------------------------------------------- Pain Assessment Details Patient Name: Date of Service: [name], [name] 06/20/2020 12:45 PM Medical Record Number: [id number] Patient Account Number: [id number] Date of Birth/Sex: Treating RN: 1956-02-21 (65 y.o. [name] Primary Care Koki Buxton: [name] Other Clinician: Referring Velta Rockholt: Treating Socorro Ebron/Extender: [name] in Treatment: 22 Active Problems Location of Pain Severity and Description of Pain Patient Has Paino No Site Locations Rate the pain. Current Pain Level: 0 Pain Management and Medication Current Pain Management: Electronic Signature(s) Signed: 06/20/2020 5:25:13 PM By: [name] RN Entered By: [name] on 06/20/2020 12:58:47 -------------------------------------------------------------------------------- Patient/Caregiver Education Details Patient Name: Date of Service: [name], [name] 1/19/2022andnbsp12:45 PM Medical Record Number: [id number] Patient Account Number: [id number] Date of Birth/Gender: Treating RN: [birth year] (65 y.o. [name] Primary Care Physician: [name] Other Clinician: Referring Physician: Treating Physician/Extender: [name] in Treatment: 22 Education Assessment Education Provided To: Patient Education Topics Provided Venous: Methods: Explain/Verbal Responses: Reinforcements needed, State content correctly Wound/Skin Impairment: Methods: Explain/Verbal Responses: Reinforcements needed, State content correctly Electronic Signature(s) Signed: 06/20/2020 5:51:30 PM By: Baruch Gouty RN, BSN Entered By: Baruch Gouty on 06/20/2020  13:38:02 -------------------------------------------------------------------------------- Wound Assessment Details Patient Name: Date of Service: [name], [name] 06/20/2020 12:45 PM Medical Record Number: [id number] Patient Account Number: [id number] Date of Birth/Sex: Treating RN: 1956/06/02 (65 y.o. [name], [name] Primary Care Reilyn Nelson: [name] Other Clinician: Referring Myrissa Chipley: Treating Elly Haffey/Extender: [name] in Treatment: 22 Wound Status Wound Number: 15 Primary Venous Leg Ulcer Etiology: Wound Location: Left, Medial Malleolus Wound Status: Open Wounding Event: Gradually Appeared Comorbid Peripheral Venous Disease, Osteoarthritis, Confinement Date Acquired: 12/30/2019 History: Anxiety Weeks Of Treatment: 22 Clustered Wound: No Wound Measurements Length: (cm) 1.5 Width: (cm) 0.7 Depth: (cm) 0.1 Area: (cm) 0.825 Volume: (cm) 0.082 % Reduction in Area: 92.9% % Reduction in Volume: 99.1% Epithelialization: Medium (34-66%) Tunneling: No Undermining: No Wound Description Classification: Full Thickness With Exposed Support Structures Wound Margin: Flat and Intact Exudate Amount: Medium Exudate Type: Serosanguineous Exudate Color: red, brown Foul Odor After Cleansing: No Slough/Fibrino No Wound Bed Granulation Amount: Large (67-100%) Exposed Structure Granulation Quality: Red Fascia Exposed: No Necrotic Amount: None Present (0%) Fat Layer (Subcutaneous Tissue) Exposed: Yes Tendon Exposed: No Muscle Exposed: No Joint Exposed: No Bone Exposed: No Treatment Notes Wound #15 (Malleolus) Wound Laterality: Left, Medial Cleanser Soap and Water Discharge Instruction: May shower and wash wound with dial antibacterial soap and water prior to dressing change.  Peri-Wound Care Triamcinolone 15 (g) Discharge Instruction: Use triamcinolone 15 (g) as directed Sween Lotion (Moisturizing lotion) Discharge Instruction: Apply  moisturizing lotion as directed Topical Primary Dressing Promogran Prisma Matrix, 4.34 (sq in) (silver collagen) Discharge Instruction: Moisten collagen with saline or hydrogel Secondary Dressing Woven Gauze Sponge, Non-Sterile 4x4 in Discharge Instruction: Apply over primary dressing as directed. ABD Pad, 5x9 Discharge Instruction: Apply over primary dressing as directed. Secured With Compression Wrap Unnaboot w/Calamine, 4x10 (in/yd) Discharge Instruction: Apply Unnaboot as directed. Compression Stockings Add-Ons Electronic Signature(s) Signed: 06/20/2020 5:25:13 PM By: [name] RN Entered By: [name] on 06/20/2020 13:00:52 -------------------------------------------------------------------------------- Vitals Details Patient Name: Date of Service: [name], [name] 06/20/2020 12:45 PM Medical Record Number: [id number] Patient Account Number: [id number] Date of Birth/Sex: Treating RN: 1956-05-17 (65 y.o. [name], [name] Primary Care Treven Holtman: [name] Other Clinician: Referring Kari Kerth: Treating Colie Fugitt/Extender: [name] in Treatment: 22 Vital Signs Time Taken: 12:57 Temperature (F): 98.3 Height (in): 68 Pulse (bpm): 62 Respiratory Rate (breaths/min): 17 Blood Pressure (mmHg): 113/68 Reference Range: 80 - 120 mg / dl Electronic Signature(s) Signed: 06/20/2020 5:25:13 PM By: [name] RN Entered By: [name] on 06/20/2020 12:58:36

## 2020-06-20 NOTE — Progress Notes (Addendum)
REILLY, MANGIERI ([id number]) Visit Report for 06/20/2020 Chief Complaint Document Details Patient Name: Date of Service: [facility] [name], [name] 06/20/2020 12:45 PM Medical Record Number: [id number] Patient Account Number: [id number] Date of Birth/Sex: Treating RN: 31-Jul-1955 (65 y.o. [name] Provider: [name] Other Clinician: Referring Provider: Treating Provider/Extender: [name] in Treatment: 22 Information Obtained from: Patient Chief Complaint patient is been followed long-term in this clinic for venous insufficiency ulcers with inflammation, hypertension and ulceration over the medial ankle bilaterally. 01/17/2020; this is a patient who is here for review of postoperative wounds on the left lateral ankle and recurrence of venous stasis ulceration on the left medial Electronic Signature(s) Signed: 06/20/2020 1:10:21 PM By: [name] PA-C Entered By: [name] on 06/20/2020 13:10:21 -------------------------------------------------------------------------------- HPI Details Patient Name: Date of Service: [name], [name] 06/20/2020 12:45 PM Medical Record Number: [id number] Patient Account Number: [id number] Date of Birth/Sex: Treating RN: 04-07-1956 (65 y.o. [name] Provider: [name] Other Clinician: Referring Provider: Treating Provider/Extender: [name] in Treatment: 22 History of Present Illness HPI Description: the remaining wound is over the left medial ankle. Similar wound over the right medial ankle healed largely with use of Apligraf. Most recently we have been using Hydrofera Blue over this wound with considerable improvement. The patient has been extensively worked up in the past for her venous insufficiency and she is not a candidate for antireflux surgery although I have none of the details available currently. 08/24/14; considerable  improvement today. About 50% of this wound areas now epithelialized. The base of the wound appears to be healthier granulation.as opposed to last week when she had deteriorated a considerable improvement 08/17/14; unfortunately the wound has regressed somewhat. The areas of epithelialization from the superior aspect are not nearly as healthy as they were last week. The patient thinks her Hydrofera Blue slipped. 09/07/14; unfortunately the area has markedly regressed in the 2 weeks since I've seen this. There is an odor surrounding erythema. The healthy granulation tissue that we had at the base of the wound now is a dusky color. The nurse reports green drainage 09/14/14; the area looks somewhat better than last week. There is less erythema and less drainage. The culture I did did not show any growth. Nevertheless I think it is better to continue the Cipro and doxycycline for a further week. The remaining wound area was debridement. 09/21/14. Wound did not require debridement last week. Still less erythema and less drainage. She can complete her antibiotics. The areas of epithelialization in the superior aspect of the wound do not look as healthy as they did some weeks ago 10/05/14 continued improvement in the condition of this wound. There is advancing epithelialization. Less aggressive debridement required 10/19/14 continued improvement in the condition and volume of this wound. Less aggressive debridement to the inferior part of this to remove surface slough and fibrinous eschar 11/02/14 no debridement is required. The surface granulation appears healthy although some of her islands of epithelialization seem to have regressed. No evidence of infection 11/16/14; lites surface debridement done of surface eschar. The wound does not look to be unhealthy. No evidence of infection. Unfortunately the patient has had podiatry issues in the right foot and for some reason has redeveloped small surface ulcerations in  the medial right ankle. Her original presentation involved wounds in this area 11/23/14 no debridement. The area on the right ankle  has enlarged. The left ankle wound appears stable in terms of the surface although there is periwound inflammation. There has been regression in the amount of new skin 11/30/14 no debridement. Both wound areas appear healthy. There was no evidence of infection. The the new area on the right medial ankle has enlarged although that both the surfaces appear to be stable. 12/07/14; Debridement of the right medial ankle wound. No no debridement was done on the left. 12/14/14 no major change in and now bilateral medial ankle wounds. Both of these are very painful but the no overt evidence of infection. She has had previous venous ablation 12/21/14; patient states that her right medial ankle wound is considerably more painful last week than usual. Her left is also somewhat painful. She could not tolerate debridement. The right medial ankle wound has fibrinous surface eschar 12/28/14 this is a patient with severe bilateral venous insufficiency ulcers. For a considerable period of time we actually had the one on the right medial ankle healed however this recently opened up again in June. The left medial ankle wound has been a refractory area with some absent flows. We had some success with Hydrofera Blue on this area and it literally closed by 50% however it is recently opened up Foley. Both of these were debridement today of surface eschar. She tolerates this poorly 01/25/15: No change in the status of this. Thick adherent escar. Very poor tolerance of any attempt at debridement. I had healed the right medial malleolus wound for a considerable amount of time and had the left one down to about 50% of the volume although this is totally regressed over the last 48 weeks. Further the right leg has reopened. she is trying to make a appointment with pain and vascular, previous ablations with  Dr. Aleda Grana. I do not believe there is an arterial insufficiency issue here 02/01/15 the status of the adherent eschar bilaterally is actually improved. No debridement was done. She did not manage to get vascular studies done 02/08/15 continued debridement of the area was done today. The slough is less adherent and comes off with less pressure. There is no surrounding infection peripheral pulses are intact 02/15/15 selective debridement with a disposable curette. Again the slough is less adherent and comes off with less difficulty. No surrounding infection peripheral pulses are intact. 02/22/15 selective debridement of the right medial ankle wound. Slough comes off with less difficulty. No obvious surrounding infection peripheral pulses are intact I did not debridement the one on the left. Both of these are stable to improved 03/01/15 selective debridement of both wound areas using a curette to. Adherent slough cup soft with less difficulty. No obvious surrounding infection. The patient tells me that 2 days ago she noted a rash above the right leg wrap. She did not have this on her lower legs when she change this over she arrives with widespread left greater than right almost folliculitis-looking rash which is extremely pruritic. I don't see anything to culture here. There is no rash on the rest of her body. She feels well systemically. 03/08/15; selective debridement of both wounds using a curette. Base of this does not look unhealthy. She had limegreen drainage coming out of the left leg wound and describes a lot of drainage. The rash on her left leg looks improved to. No cultures were done. 03/22/15; patient was not here last week. Basal wounds does not look healthy and there is no surrounding erythema. No drainage. There is still a rash on  the left leg that almost looks vasculitic however it is clearly limited to the top of where the wrap would be. 04/05/15; on the right required a surgical  debridement of surface eschar and necrotic subcutaneous tissue. I did not debridement the area on the left. These continue to be large open wounds that are not changing that much. We were successful at one point in healing the area on the right, and at the same time the area on the left was roughly half the size of current measurements. I think a lot of the deterioration has to do with the prolonged time the patient is on her feet at work 04/19/15 I attempted-like surface debridement bilaterally she does not tolerate this. She tells me that she was in allergic care yesterday with extreme pain over her left lateral malleolus/ankle and was told that she has an "sprain" 05/03/15; large bilateral venous insufficiency wounds over the medial malleolus/medial aspect of her ankles. She complains of copious amounts of drainage and his usual large amounts of pain. There is some increasing erythema around the wound on the right extending into the medial aspect of her foot to. historically she came in with these wounds the right one healed and the left one came down to roughly half its current size however the right one is reopened and the left is expanded. 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 05/10/15 large bilateral venous insufficiency wounds. There is less adherence surface left however the surface culture that I did last week grew pseudomonas therefore bilateral selective debridement score necessary. There is surrounding erythema. The patient describes severe bilateral drainage and a lot of pain in the left ankle. Apparently her podiatrist was were ready to do a cortisone shot 05/17/15; the patient complains of pain and again copious amounts of drainage. 05/24/15; we used Iodo flex last week. Patient notes considerable improvement in wound drainage. Only needed to change this once. 05/31/15; we continued Iodoflex; the base of these large wounds  bilaterally is not too bad but there is probably likely a significant bioburden here. I would like to debridement just doesn't tolerate it. 06/06/14 I would like to continue the Iodoflex although she still hasn't managed to obtain supplies. She has bilateral medial malleoli or large wounds which are mostly superficial. Both of them are covered circumferentially with some nonviable fibrinous slough although she tolerates debridement very poorly. She apparently has an appointment for an ablation on the right leg by interventional radiology. 06/14/15; the patient arrives with the wounds and static condition. We attempted a debridement although she does not do well with this secondary to pain. I 07/05/15; wounds are not much smaller however there appears to be a cleaner granulating base. The left has tight fibrinous slough greater than the right. Debridement is tolerated poorly due to pain. Iodoflex is done more for these wounds in any of the multitude of different dressings I have tried on the left 1 and then subsequently the right. 07/12/15; no change in the condition of this wound. I am able to do an aggressive debridement on the right but not the left. She simply cannot tolerate it. We have been using Iodoflex which helps somewhat. It is worthwhile remembering that at one point we healed the right medial ankle wound and the left was about 25% of the current circumference. We have suggested returning to vascular surgery for review of possible further ablations for one reason  or another she has not been able to do this. 07/26/15 no major change in the condition of either wound on her medial ankle. I did not attempt to debridement of these. She has been aggressively scrubbing these while she is in the shower at home. She has her supply of Iodoflex which seems to have done more for these wounds then anything I have put on recently. 08/09/15 wound area appears larger although not verified by measurements. Using  Iodoflex 09/05/2015 -- she was here for avisit today but had significant problems with the wound and I was asked to see her for a physician opinion. I have summarize that this lady has had surgery on her left lower extremity about 10 years ago where the possible veins stripping was done. She has had an opinion from interventional radiology around November 2016 where no further sclerotherapy was ordered. The patient works 12 hours a day and stands on a concrete floor with work boots and is unable to get the proper compression she requires and cannot elevate her limbs appropriately at any given time. She has recently grown Pseudomonas from her wound culture but has not started her ciprofloxacin which was called in for her. 09/13/15 this continues to be a difficult situation for this patient. At one point I had this wound down to a 1.5 x 1.5" wound on her left leg. This is deteriorated and the right leg has reopened. She now has substantial wounds on her medial calcaneus, malleoli and into her lower leg. One on the left has surface eschar but these are far too painful for me to debridement here. She has a vascular surgery appointment next week to see if anything can be done to help here. I think she has had previous ablations several years ago at [facility]. She has no major edema. She tells me that she did not get product last time [facility] Ag] and went for several days without it. She continues to work in work boots 12 hours a day. She cannot get compression/4-layer under her work boots. 09/20/15 no major change. Periwound edema control was not very good. Her point with pain and vascular is next Wednesday the 25th 09/28/15; the patient is seen vascular surgery and is apparently scheduled for repeat duplex ultrasounds of her bilateral lower legs next week. 10/05/15; the patient was seen by Dr. Doren Custard of vascular surgery. He feels that she should have arterial insufficiency excluded as cause/contributed to  her nonhealing stage she is therefore booked for an arteriogram. She has apparently monophasic signals in the dorsalis pedis pulses. She also of course has known severe chronic venous insufficiency with previous procedures as noted previously. I had another long discussion with the patient today about her continuing to work 12 hour shifts. I've written her out for 2 months area had concerns about this as her work location is currently undergoing significant turmoil and this may lead to her termination. She is aware of this however I agree with her that she simply cannot continue to stand for 12 hours multiple days a week with the substantial wound areas she has. 10/19/15; the Dr. Doren Custard appointment was largely for an arteriogram which was normal. She does not have an arterial issue. He didn't make a comment about her chronic venous insufficiency for which she has had previous ablations. Presumably it was not felt that anything additional could be done. The patient is now out of work as I prescribed 2 weeks ago. Her wounds look somewhat less aggravated presumably  because of this. I felt I would give debridement another try today 10/25/15; no major change in this patient's wounds. We are struggling to get her product that she can afford into her own home through her insurance. 11/01/15; no major change in the patient's wounds. I have been using silver alginate as the most affordable product. I spoke to Dr. Marla Roe last week with her requested take her to the OR for surgical debridement and placement of ACEL. Dr. Marla Roe told me that she would be willing to do this however [facility] will not cover this, fortunately the patient has [place] healthcare of some variant 11/08/15; no major change in the patient's wounds. She has been completely nonviable surface that this but is in too much pain with any attempted debridement are clinic. I have arranged for her to see Dr. Marla Roe ham of plastic  surgery and this appointment is on Monday. I am hopeful that they will take her to the OR for debridement, possible ACEL ultimately possible skin graft 11/22/15 no major change in the patient's wounds over her bilateral medial calcaneus medial malleolus into the lower legs. Surface on these does not look too bad however on the left there is surrounding erythema and tenderness. This may be cellulitis or could him sleepy tinea. 11/29/15; no major changes in the patient's wounds over her bilateral medial malleolus. There is no infection here and I don't think any additional antibiotics are necessary. There is now plan to move forward. She sees Dr. Marla Roe in a week's time for preparation for operative debridement and ACEL placement I believe on 7/12. She then has a follow-up appointment with Dr. Marla Roe on 7/21 12/28/15; the patient returns today having been taken to the [facility] by Dr. Marla Roe 12/12/15 she underwent debridement, intraoperative cultures [which were negative]. She had placement of a wound VAC. Parent really ACEL was not available to be placed. The wound VAC foam apparently adhered to the wound since then she's been using silver alginate, Xeroform under Ace wraps. She still says there is a lot of drainage and a lot of pain 01/31/16; this is a patient I see monthly. I had referred her to Dr. Marla Roe him of plastic surgery for large wounds on her bilateral medial ankles. She has been to the OR twice once in early July and once in early August. She tells me over the last 3 weeks she has been using the wound VAC with ACEL underneath it. On the right we've simply been using silver alginate. Under Kerlix Coban wraps. 02/28/16; this is a patient I'm currently seeing monthly. She is gone on to have a skin graft over her large venous insufficiency ulcer on the left medial ankle. This was done by Dr. Marla Roe him. The patient is a bit perturbed about why she didn't have one on her right medial  ankle wound. She has been using silver alginate to this. 03/06/16; I received a phone call from her plastic surgery Dr. Marla Roe. She expressed some concern about the viability of the skin graft she did on the left medial ankle wound. Asked me to place Endoform on this. She told me she is not planning to do a subsequent skin graft on the right as the left one did not take very well. I had placed Hydrofera Blue on the right 03/13/16; continue to have a reasonably healthy wound on the right medial ankle. Down to 3 mm in terms of size. There is epithelialization here. The area on the left medial  ankle is her skin graft site. I suppose the last week this looks somewhat better. She has an open area inferiorly however in the center there appears to be some viable tissue. There is a lot of surface callus and eschar that will eventually need to come off however none of this looked to be infected. Patient states that the is able to keep the dressing on for several days which is an improvement. 03/20/16 no major change in the circumference of either wound however on the left side the patient was at Dr. Eusebio Friendly office and they did a debridement of left wound. 50% of the wound seems to be epithelialized. I been using Endoform on the left Hydrofera Blue in the right 03/27/16; she arrives today with her wound is not looking as healthy as they did last week. The area on the right clearly has an adherent surface to this a very similar surface on the left. Unfortunately for this patient this is all too familiar problem. Clearly the Endoform is not working and will need to change that today that has some potential to help this surface. She does not tolerate debridement in this clinic very well. She is changing the dressing wants 04/03/16; patient arrives with the wounds looking somewhat better especially on the right. Dr. Migdalia Dk change the dressing to silver alginate when she saw her on Monday and also sold her  some compression socks. The usefulness of the latter is really not clear and woman with severely draining wounds. 04/10/16; the patient is doing a bit of an experiment wearing the compression stockings that Dr. Migdalia Dk provided her to her left leg and the out of legs based dressings that we provided to the right. 05/01/16; the patient is continuing to wear compression stockings Dr. Migdalia Dk provided her on the left that are apparently silver impregnated. She has been using Iodoflex to the right leg wound. Still a moderate amount of drainage, when she leaves here the wraps only last for 4 days. She has to change the stocking on the left leg every night 05/15/16; she is now using compression stockings bilaterally provided by Dr. Marla Roe. She is wearing a nonadherent layer over the wounds so really I don't think there is anything specific being done to this now. She has some reduction on the left wound. The right is stable. I think all healing here is being done without a specific dressing 06/09/16; patient arrives here today with not much change in the wound certainly in diameter to large circular wounds over the medial aspect of her ankle bilaterally. Under the light of these services are certainly not viable for healing. There is no evidence of surrounding infection. She is wearing compression stockings with some sort of silver impregnation as prescribed by Dr. Marla Roe. She has a follow-up with her tomorrow. 06/30/16; no major change in the size or condition of her wounds. These are still probably covered with a nonviable surface. She is using only her purchase stockings. She did see Dr. Marla Roe who seemed to want to apply Dakin's solution to this I'm not extreme short what value this would be. I would suggest Iodoflex which she still has at home. 07/28/16; I follow Mrs. Spraggins episodically along with Dr. Marla Roe. She has very refractory venous insufficiency wounds on her bilateral medial legs  left greater than right. She has been applying a topical collagen ointment to both wounds with Adaptic. I don't think Dr. Marla Roe is planning to take her back to the OR. 08/19/16; I follow  Mrs. Consuegra on a monthly basis along with Dr. Marla Roe of plastic surgery. 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 I been following her for a number of years. At one point I was able to get the right medial malleolus wound to heal and had the left medial malleolus down to about half its current size however and I had to send her to plastic surgery for an operative debridement. Since then things have been stable to slightly improve the area on the right is slightly better one in the left about the same although there is much less adherent surface than I'm used to with this patient. She is using some form of liquid collagen gel that Dr. Marla Roe provided a Kerlix cover with the patient's own pressure stockings. She tells me that she has extreme pain in both ankles and along the lateral aspect of both feet. She has been unable to work for some period of time. She is telling me she is retiring at the beginning of April. She sees Dr. Doran Durand of orthopedics next week 09/22/16; patient has not seen Dr. Marla Roe since the last time she is here. I'm not really sure what she is using to the wounds other than bits and pieces of think she had left over including most recently Hydrofera Blue. She is using juxtalite stockings. She is having difficulty with her husband's recent illness "stroke". She is having to transport him to various doctors appointments. Dr. Marla Roe left her the option of a repeat debridement with ACEL however she has not been able to get the time to follow-up on this. She continues to have a fair amount of drainage out of these wounds with certainly precludes leaving dressings on all week 10/13/16; patient has not seen Dr.  Marla Roe since she was last in our clinic. I'm not really sure what she is doing with the wounds, we did try to get her [facility] and I think she is actually using this most of the time. Because of drainage she states she has to change this every second day although this is an improvement from what she used to do. She went to see Dr. Doran Durand who did not think she had a muscular issue with regards to her feet, he referred her to a neurologist and I think the appointment is sometime in June. I changed her back to Iodoflex which she has used in the past but not recently. 11/03/16; the patient has been using Iodoflex although she ran out of this. Still claims that there is a lot of drainage although the wound does not look like this. No surrounding erythema. She has not been back to see Dr. Marla Roe 11/24/16; the patient has been using Iodoflex again but she ran out of it 2 or 3 days ago. There is no major change in the condition of either one of these wounds in fact they are larger and covered in a thick adherent surface slough/nonviable tissue especially on the left. She does not tolerate mechanical debridement in our clinic. Going back to see Dr. Marla Roe of plastic surgery for an operative debridement would seem reasonable. 12/15/16; the patient has not been back to see Dr. Marla Roe. She is been dealing with a series of illnesses and her husband which of monopolized her time. She is been using Sorbact which we largely supplied. She states the drainage is bad enough that it maximum she can go 2-3 days without changing the dressing 01/12/2017 --  the patient has not been back for about 4 weeks and has not seen Dr. Marla Roe not does she have any appointment pending. 01/23/17; patient has not seen Dr. Marla Roe even though I suggested this previously. She is using Santyl that was suggested last week by Dr. Con Memos this Cost her $16 through her insurance which is indeed surprising 02/12/17;  continuing Santyl and the patient is changing this daily. A lot of drainage. She has not been back to see plastic surgery she is using an Ace wrap. Our intake nurse suggested wrap around stockings which would make a good reasonable alternative 02/26/17; patient is been using Santyl and changing this daily due to drainage. She has not been to see plastic surgery she uses in April Ace wrap to control the edema. She did obtain extremitease stockings but stated that the edema in her leg was to big for these 03/20/17; patient is using Santyl and Anasept. Surfaces looked better today the area on the right is actually measuring a little smaller. She has states she has a lot of pain in her feet and ankles and is asking for a consult to pain control which I'll try to help her with through our case manager. 04/10/17; the patient arrives with better-looking wound surfaces and is slightly smaller wound on the left she is using a combination of Santyl and Anasept. She has an appointment or at least as started in the pain control center associated with [facility] 05/14/17; this is a patient who I followed for a prolonged period of time. 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 At one point I had this down to a much smaller wound on the left however these reopened and we've never been able to get these to heal. She has been using Santyl and Anasept gel although 2 weeks ago she ran out of the Anasept gel. She has a stable appearance of the wound. She is going to the wound care clinic at [facility]. They wanted do a nerve block/spinal block although she tells me she is reluctant to go forward with that. 05/21/17; this is a patient I have followed for many years. 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 Chronic pain and deformity in her ankles as well. She is been to see plastic surgery as well as orthopedics. Using PolyMem AG most recently/Kerramax/ABDs  and 2 layer compression. She has managed to keep this on and she is coming in for a nurse check to change the dressing on Tuesdays, we see her on Fridays 06/05/17; really quite a good looking surface and the area especially on the right medial has contracted in terms of dimensions. Well granulated healthy-looking tissue on both sides. Even with an open curet there is nothing that even feels abnormal here. This is as good as I've seen this in quite some time. We have been using PolyMem AG and bringing her in for a nurse check 06/12/17; really quite good surface on both of these wounds. The right medial has contracted a bit left is not. We've been using PolyMem and AG and she is coming in for a nurse visit 06/19/17; we have been using PolyMem AG and bringing her in for a nurse check. Dimensions of her wounds are not better but the surfaces looked better bilaterally. She complained of bleeding last night and the left wound and increasing pain bilaterally. She states her wound pain is more neuropathic than just the wounds. There was some suggestion that this  was radicular from her pain management doctor in talking to her it is really difficult to sort this out. 06/26/17; using PolyMem and AG and bringing her in for a nurse check as All of this and reasonably stable condition. Certainly not improved. The dimensions on the lateral part of the right leg look better but not really measuring better. The medial aspect on the left is about the same. 07/03/16; we have been using PolyMen AG and bringing her in for a nurse check to change the dressings as the wounds have drainage which precludes once weekly changing. We are using all secondary absorptive dressings.our intake nurse is brought up the idea of using a wound VAC/snap VAC on the wound to help with the drainage to see if this would result in some contraction. This is not a bad idea. The area on the right medial is actually looking smaller. Both wounds have a  reasonable-looking surface. There is no evidence of cellulitis. The edema is well controlled 07/10/17; the patient was denied for a snap VAC by her insurance. The major issue with these wounds continues to be drainage. We are using wicked PolyMem AG and she is coming in for a nurse visit to change this. The wounds are stable to slightly improved. The surface looks vibrant and the area on the right certainly has shrunk in size but very slowly 07/17/17; the patient still has large wounds on her bilateral medial malleoli. Surface of both of these wounds looks better. The dimensions seem to come and go but no consistent improvement. There is no epithelialization. We do not have options for advanced treatment products due to insurance issues. They did not approve of the wound VAC to help control the drainage. More recently we've been using PolyMem and AG wicked to allow drainage through. We have been bringing her in for a nurse visit to change this. We do not have a lot of options for wound care products and the home again due to insurance issues 07/24/17; the patient's wound actually looks somewhat better today. No drainage measurements are smaller still healthy-looking surface. We used silver collagen under PolyMen started last week. We have been bringing her in for a dressing change 07/31/17; patient's wound surface continued to look better and I think there is visible change in the dimensions of the wound on the right. Rims of epithelialization. We have been using silver collagen under PolyMen and bringing her in for a dressing change. There appears to be less drainage although she is still in need of the dressing change 08/07/17. Patient's wound surface continues to look better on both sides and the area on the right is definitely smaller. We have been using silver collagen and PolyMen. She feels that the drainage has been it has been better. I asked her about her vascular status. She went to see Dr.  Aleda Grana at [facility] and had some form of ablation. I don't have much detail on this. I haven't my notes from 2016 that she was not a candidate for any further ablation but I don't have any more information on this. We had referred her to vein and vascular I don't think she ever went. He does not have a history of PAD although I don't have any information on this either. We don't even have ABIs in our record 08/14/17; we've been using silver collagen and PolyMen cover. And putting the patient and compression. She we are bringing her in as a nurse visit to change this because ofarge  amount of drainage. We didn't the ABIs in clinic today since they had been done in many moons 1.2 bilaterally. She has been to see vein and vascular however this was at [facility] and she had ablation although I really don't have any information on this all seemed biking get a report. She is also been operatively debrided by plastic surgery and had a cell placed probably 8-12 months ago. This didn't have a major effect. We've been making some gains with current dressings 08/19/17-She is here in follow-up evaluation for bilateral medial malleoli ulcers. She continues to tolerate debridement very poorly. We will continue with recently changed topical treatment; if no significant improvement may consider switching to Iodosorb/Iodoflex. 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 She has been using silver collagen and PolyMem. I believe she has been used and tried on Iodoflex before. 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 These have been very refractory over time. We have been using silver collagen and PolyMen. She is coming in once a week for a doctor's and once a week for  nurse visits. We are actually making some progress 09/18/17; the patient's wounds are smaller especially on the right medial. She arrives today to upset to consider even washing these off with Anasept which I think is been part of the reason this is been closing. We've been using collagen covered in PolyMen otherwise. It is noted that she has a small area of folliculitis on the right medial calf that. As we are wrapping her legs I'll give her a short course of doxycycline to make sure this doesn't amount to anything. She is a long list of complaints today including imbalance, shortness of breath on exertion, inversion of her left ankle. With regards to the latter complaints she is been to see orthopedics and they offered her a tendon release surgery I believe but wanted her wounds to be closed first. I have recommended she go see her primary physician with regards to everything else. 09/25/17; patient's wounds are about the same size. We have made some progress bilaterally although not in recent weeks. She will not allow me T wash these wounds with Anasept even if she is doing her cell. Wheeze we've been using collagen covered in PolyMen. Last week she had a small area of folliculitis this is now opened into a small wound. She completed 5 days of trimethoprim sulfamethoxazole 10/02/17; unfortunately the area on her left medial ankle is worse with a larger wound area towards the Achilles. The patient complains of a lot of pain. She will not allow debridement although visually I don't think there is anything to debridement in any case. We have been using silver collagen and PolyMen for several months now. Initially we are making some progress although I'm not really seeing that today. We will move back to [facility]. His admittedly this is a bit of a repeat however I'm hoping that his situation is different now. The patient tells me she had her leg on the left give out on her yesterday this is process  some pain. 10/09/17; the patient is seen twice a week largely because of drainage issues coming out of the chronic medial bimalleolar wounds that are chronic. Last week the dimensions of the one on the left looks a  little larger I changed her to [facility]. She comes in today with a history of terrible pain in the bilateral wound areas. She will not allow debridement. She will not even allow a tissue culture. There is no surrounding erythema no no evidence of cellulitis. We have been putting her Kerlix Coban man. She will not allow more aggressive compression as there was a suggestion to put her in 3 layer wraps. 10/16/17; large wounds on her bilateral medial malleoli. These are chronic. Not much change from last week. The surface looks have healthy but absolutely no epithelialization. A lot of pain little less so of drainage. She will not allow debridement or even washing these off in the vigorous fashion with Anasept. 10/23/17; large wounds on her bilateral malleoli which are chronic. Some improvement in terms of size perhaps on the right since last time I saw these. She states that after we increased the 3 layer compression there was some bleeding, when she came in for a nurse visit she did not want 3 layer compression put back on about our nurse managed to convince her. She has known chronic venous visit issues and I'm hoping to get her to tolerate the 3 layer compression. using Hydrofera Blue 10/30/17; absolutely no change in the condition of either wound although we've had some improvement in dimensions on the right.. Attempted to put her in 3 layer compression she didn't tolerated she is back in 2 layer compression. We've been using Hydrofera Blue We looked over her past records. She had venous reflux studies in November 2016. There was no evidence of deep venous reflux on the right. Superficial vein did not show the greater saphenous vein at think this is been previously ablated the small  saphenous vein was within normal limits. The left deep venous system showed no DVT the vessels were positive for deep venous reflux in the posterior tibial veins at the ankle. The greater saphenous vein was surgically absent small saphenous vein was within normal limits. She went to vein and vascular at [facility]. I believe she had an ablation on the left greater saphenous vein. I'll update her reflux studies perhaps ever reviewed by vein and vascular. We've made absolutely no progress in these wounds. Will also try to read and TheraSkins through her insurance 11/06/17; W the patient apparently has a 2 week follow-up with vein and vascular I like him to review the whole issue with regards to her previous vascular workup by Dr. Aleda Grana. We've really made no progress on these wounds in many months. She arrives today with less viable looking surface on the left medial ankle wound. This was apparently looking about the same on Tuesday when she was here for nurse visit. 11/13/17; deep tissue culture I did last time of the left lower leg showed multiple organisms without any predominating. In particular no Staphylococcus or group A strep were isolated. We sent her for venous reflux studies. She's had a previous left greater saphenous vein stripping and I think sclerotherapy of the right greater saphenous vein. She didn't really look at the lesser saphenous vein this both wounds are on the medial aspect. She has reflux in the common femoral vein and popliteal vein and an accessory vein on the right and the common femoral vein and popliteal vein on the left. I'm going to have her go to see vein and vascular just the look over things and see if anything else beside aggressive compression is indicated here. We have not been able to  make any progress on these wounds in spite of the fact that the surface of the wounds is never look too bad. 11/20/17; no major change in the condition of the wounds. Patient  reports a large amount of drainage. She has a lot of complaints of pain although enlisting her today I wonder if some of this at least his neuropathic rather than secondary to her wounds. She has an appointment with vein and vascular on 12/30/17. The refractory nature of these wounds in my mind at least need vein and vascular to look over the wounds the recent reflux studies we did and her history to see if anything further can be done here. I also note her gait is deteriorated quite a bit. 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 She has a bilateral Trendelenburg gait. I wonder if this is neuropathic or perhaps multilevel radicular. 11/27/17; her wounds actually looks slightly better. Healthy-looking granulation tissue a scant amount of epithelialization. [place] healthcare will not pay for [employer]. They will play for tri layer Oasis and Dermagraft. This is not a diabetic ulcer. We'll try for the tri layer Oasis. She still complains of some drainage. She has a vein and vascular appointment on 12/30/17 12/04/17; the wounds visually look quite good. Healthy-looking granulation with some degree of epithelialization. We are still waiting for response to our request for trial to try layer Oasis. Her appointment with vascular to review venous and arterial issues isn't sold the end of July 7/31. Not allow debridement or even vigorous cleansing of the wound surface. 12/18/17; slightly smaller especially on the right. 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 We've been using Hydrofera Blue. We still are looking into triple layer Oasis through her insurance 01/08/18 on evaluation today patient's wound actually appears to be showing signs of good improvement at this point in time. She has been tolerating the dressing changes without complication. Fortunately there does not appear to be any evidence of infection at this point in time. We have been utilizing silver nitrate  which does seem to be of benefit for her which is also good news. Overall I'm very happy with how things seem to be both regards appearance as well as measurement. Patient did see Dr. Bridgett Larsson for evaluation on 12/30/17. In his assessment he felt that stripping would not likely add much more than chronic compression to the patient's healing process. His recommendation was to follow-up in three months with Dr. Doren Custard if she hasn't healed in order to consider referral back to you and see vascular where she previously was in a trial and was able to get her wound to heal. I'll be see what she feels she when you staying compression and he reiterated this as well. 01/13/18 on evaluation today patient appears to actually be doing very well in regard to her bilateral medial malleolus ulcers. She seems to have tolerated the chemical cauterization with silver nitrate last week she did have some pain through that evening but fortunately states that I'll be see since it seems to be doing better she is overall pleased with the progress. 01/21/18; really quite a remarkable improvement since I've last seen these wounds. We started using silver nitrate specially on the islands of hyper granulation which for some reason her around the wound circumference. This is really done quite nicely. Primary dressing Hydrofera Blue under 4 layer compression. She seems to be able to hold out without a nurse rewrap. Follow-up in 1 week 01/28/18; we've continued  the hydrofera blue but continued with chemical cauterization to the wound area that we started about a month ago for irregular hyper granulation. She is made almost stunning improvement in the overall wound dimensions. I was not really expecting this degree of improvement in these chronic wounds 02/05/18; we continue with Hydrofera Bluebut of also continued the aggressive chemical cauterization with silver nitrate. We made nice progress with the right greater than left  wound. 02/12/18. We continued with Hydrofera Blue after aggressive chemical cauterization with silver nitrate. We appear to be making nice progress with both wound areas 02/19/2018; we continue with [facility] after washing the wounds vigorously with Anasept spray and chemical cauterization with silver nitrate. We are making excellent progress. The area on the right's just about closed 02/26/2018. The area on the left medial ankle had too much necrotic debris today. I used a #5 curette we are able to get most of the soft. I continued with the silver nitrate to the much smaller wound on the right medial ankle she had a new area on her right lower pretibial area which she says was due to a role in her compression 03/05/2018; both wound areas look healthy. Not much change in dimensions from last week. I continue to use silver nitrate and Hydrofera Blue. The patient saw Dr. Doren Custard of vein and vascular. He felt she had venous stasis ulcers. He felt based on her previous arteriogram she should have adequate circulation for healing. Also she has deep venous reflux but really no significant correctable superficial venous reflux at this time. He felt we should continue with conservative management including leg elevation and compression 04/02/2018; since we last saw this woman about a month ago she had a fall apparently suffered a pelvic fracture. I did not look up the x-ray. Nevertheless because of pain she literally was bedbound for 2 weeks and had home health coming out to change the dressing. Somewhat predictably this is resulted in considerable improvement in both wound areas. The right is just about closed on the medial malleolus and the left is about half the size. 04/16/2018; both her wounds continue to go down in size. Using Hydrofera Blue. 05/07/18; both her wounds appeared to be improving especially on the right where it is almost closed. We are using Hydrofera Blue 05/14/2018; slightly worse this  week with larger wounds. Surface on the left medial not quite as good. We have been using Hydrofera Blue 05/21/18; again the wounds are slightly larger. Left medial malleolus slightly larger with eschar around the circumference. We have been using Hydrofera Blue undergoing a wraps for a prolonged period of time. This got a lot better when she was more recumbent due to a fall and a back injury. I change the primary dressing the silver alginate today. She did not tolerate a 4 layer compression previously although I may need to bring this up with her next time 05/28/2018; area on the left medial malleolus again is slightly larger with more drainage. Area on the right is roughly unchanged. She has a small area of folliculitis on the right medial just on the lower calf. This does not look ominous. 06/03/2018 left medial malleolus slightly smaller in a better looking surface. We used silver nitrate on this last time with silver alginate. The area on the right appears slightly smaller 1/10; left medial malleolus slightly smaller. Small open area on the right. We used silver nitrate and silver alginate as of 2 weeks ago. We continue with the wound and  compression. These got a lot better when she was off her feet 1/17; right medial malleolus wound is smaller. The left may be slightly smaller. Both surfaces look somewhat better. 1/24; both wounds are slightly smaller. Using silver alginate under Unna boots 1/31; both wounds appear smaller in fact the area on the right medial is just about closed. Surface eschar. We have been using silver alginate under Unna boots. The patient is less active now spends let much less time on her feet and I think this is contributed to the general improvement in the wound condition 2/7; both wounds appear smaller. I was hopeful the right medial would be closed however there there is still the same small open area. Slight amount of surface eschar on the left the dimensions are  smaller there is eschar but the wound edges appear to be free. We have been using silver alginate under Unna boot's 2/14; both wounds once again measure smaller. Circumferential eschar on the left medial. We have been using silver alginate under Unna boots with gradual improvement 2/21; the area on the right medial malleolus has healed. The area on the left is smaller. We have been using silver alginate and Unna boots. We can discharge wrapping the right leg she has 20/30 stockings at home she will need to protect the scar tissue in this area 2/28; the area on the right medial malleolus remains closed the patient has a compression stocking. The area on the left is smaller. We have been using silver alginate and Unna boots. 3/6 the area on the right medial ankle remains closed. Good edema control noted she is using her own compression stocking. The area on the left medial ankle is smaller. We have been managing this with silver alginate and Unna boots which we will continue today. 3/13; the area on the right medial ankle remains closed and I'm declaring it healed today. When necessary the left is about the same still a healthy-looking surface but no major change and wound area. No evidence of infection and using silver alginate under unna and generally making considerable improvement 3/27 the area on the right medial ankle remains closed the area on the left is about the same as last week. Certainly not any worse we have been using silver alginate under an Unna boot 4/3; the area on the right medial ankle remains closed per the patient. We did not look at this wound. The wound on the left medial ankle is about the same surface looks healthy we have been using silver alginate under an Unna boot 4/10; area on the right medial ankle remains closed per the patient. We did not look at this wound. The wound on the left medial ankle is slightly larger. The patient complains that the [facility] caused  burning pain all week. She also told us that she was a lot more active this week. Changed her back to silver alginate 4/17; right medial ankle still closed per the patient. Left medial ankle is slightly larger. Using silver alginate. She did not tolerate Hydrofera Blue on this area 4/24; right medial ankle remains closed we have not look at this. The left medial ankle continues to get larger today by about a centimeter. We have been using silver alginate under Unna boots. She complains about 4 layer compression as an alternative. She has been up on her feet working on her garden 5/8; right medial ankle remains closed we did not look at this. The left medial ankle has increased in size  about 100%. We have been using silver alginate under Unna boots. She noted increased pain this week and was not surprised that the wound is deteriorated 5/15; no major change in SA however much less erythema ( one week of doxy ocellulitis). 5/22-65 year old female returns at 1 week to the clinic for left medial ankle wound for which we have been using silver alginate under 3 layer compression She was placed on DOXY at last visit - the wound is wider at this visit. She is in 3 layer compression 5/29; change to [facility] last week. I had given her empiric doxycycline 2 weeks ago for a week. She is in 3 layer compression. She complains of a lot of pain and drainage on presentation today. 6/5; using Hydrofera Blue. I gave her doxycycline recently empirically for erythema and pain around the wound. Believe her cultures showed enterococcus which not would not have been well covered by doxycycline nevertheless the wound looks better and I don't feel specifically that the enterococcus needs to be covered. She has a new what looks like a wrap injury on her lateral left ankle. 6/12; she is using Hydrofera Blue. She has a new area on the left anterior lower tibial area. This was a wrap injury last week. 6/19; the patient is  using Hydrofera Blue. She arrived with marked inflammation and erythema around the wound and tenderness. 12/01/18 on evaluation today patient appears to be doing a little bit better based on what I'm hearing from the standpoint of lassos evaluation to this as far as the overall appearance of the wound is concerned. Then sometime substandard she typically sees Dr. Dellia Nims. Nonetheless overall very pleased with the progress that she's made up to this point. No fevers, chills, nausea, or vomiting noted at this time. 7/10; some improvement in the surface area. Aggressively debrided last week apparently. I went ahead with the debridement today although the patient does not tolerate this very well. We have been using Iodoflex. Still a fair amount of drainage 7/17; slightly smaller. Using Iodoflex. 7/24; no change from last week in terms of surface area. We have been using Iodoflex. Surface looks and continues to look somewhat better 7/31; surface area slightly smaller better looking surface. We have been using Iodoflex. This is under Unna boot compression 8/7-Patient presents at 1 week with Unna boot and Iodoflex, wound appears better 8/14-Patient presents at 1 week with Iodoflex, we use the Unna boot, wound appears to be stable better.Patient is getting Botox treatment for the inversion of the foot for tendon release, Next week 8/21; we are using Iodoflex. Unna boot. The wound is stable in terms of surface area. Under illumination there is some areas of the wound that appear to be either epithelialized or perhaps this is adherent slough at this point I was not really clear. It did not wipe off and I was reluctant to debride this today. 8/28; we are using Iodoflex in an Unna boot. Seems to be making good improvement. 9/4; using Iodoflex and wound is slightly smaller. 9/18; we are using Iodoflex with topical silver nitrate when she is here. The wound continues to be smaller 10/2; patient missed her  appointment last week due to GI issues. She left and Iodoflex based dressing on for 2 weeks. Wound is about the same size about the size of a dime on the left medial lower 10/9 we have been using Iodoflex on the medial left ankle wound. She has a new superficial probable wrap injury on the dorsal  left ankle 10/16; we have been using Hydrofera Blue since last week. This is on the left medial ankle 10/23; we have been using Hydrofera Blue since 2 weeks ago. This is on the left medial ankle. Dimensions are better 11/6; using Hydrofera Blue. I think the wound is smaller but still not closed. Left medial ankle 11/13; we have been using Hydrofera Blue. Wound is certainly no smaller this week. Also the surface not as good. This is the remanent of a very large area on her left medial ankle. 11/20; using Sorbact since last week. Wound was about the same in terms of size although I was disappointed about the surface debris 12/11; 3-week follow-up. Patient was on vacation. Wound is measuring slightly larger we have been using Sorbact. 12/18; wound is about the same size however surface looks better last week after debridement. We have been using Sorbact under compression 1/15 wound is probably twice the size of last time increased in length nonviable surface. We have been using Sorbact. She was running a mild fever and missed her appointment last week 1/22; the wound is come down in size but under illumination still a very adherent debris we have been Hydrofera Blue that I changed her to last week 1/29; dimensions down slightly. We have been using Hydrofera Blue 2/19 dimensions are the same however there is rims of epithelialization under illumination. Therefore more the surface area may be epithelialized 2/26; the patient's wound actually measures smaller. The wound looks healthy. We have been using Hydrofera Blue. I had some thoughts about running Apligraf then I still may do that however this looks so much  better this week we will delay that for now 3/5; the wound is small but about the same as last week. We have been using Hydrofera Blue. No debridement is required today. 3/19; the wound is about the size of a dime. Healthy looking wound even under illumination. We have been using Hydrofera Blue. No mechanical debridement is necessary 3/26; not much change from last week although still looks very healthy. We have been using Hydrofera Blue under Unna boots Patient was offered an ankle fusion by podiatry but not until the wound heals with a proceed with this. 4/9; the patient comes in today with her original wound on the medial ankle looking satisfactory however she has some uncontrolled swelling in the middle part of her leg with 2 new open areas superiorly just lateral to the tibia. I think this was probably a wrap issue. She said she felt uncomfortable during the week but did not call in. We have been using Hydrofera Blue 4/16; the wound on the medial ankle is about the same. She has innumerable small areas superior to this across her mid tibia. I think this is probably folliculitis. She is also been working in the yard doing a lot of sweating 4/30; the patient issue on the upper areas across her mid tibia of all healed. I think this was excessive yard work if I remember. Her wound on the medial ankle is smaller. Some debris on this we have been using Hydrofera Blue under Unna boots 5/7; mid tibia. She has been using Hydrofera Blue under an Unna wrap. She is apparently going for her ankle surgery on June 3 10/28/19-Patient returns to clinic with the ankle wound, we are using Hydrofera Blue under Unna wrap, surgery is scheduled for her left foot for June 3 so she will be back for nurse visit next week READMISSION 01/17/2020 Mrs. Petralia is a  65 year old woman we have had in this clinic for a long period of time with severe venous hypertension and refractory wounds on her medial lower legs and ankles  bilaterally. 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 When she was off her legs for a prolonged period example when she fell and suffered a compression fracture things would heal up quite nicely. She is now retired and we managed to heal up the right medial leg wound. The left one was very tiny last time I saw this although still refractory. 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 It got to the point where this was interfering with her walking and she elected to proceed with a ankle arthrodesis to straighten her her ankle and leave her with a functional outcome for mobilization. The patient was referred to Dr. Doren Custard and really this took some time to arrange. Dr. Doren Custard saw her on 12/07/2019. Once again he verified that she had no arterial issues. She had previously had an angiogram several years ago. Follow-up ABIs on the left showed an ABI of 1.12 with triphasic waveforms and a TBI of 0.92. She is felt to have chronic deep venous insufficiency but I do not think it was felt that anything could be done from about this from an ablation point of view. At the time Dr. Doren Custard saw this patient the wounds actually look closed via the pictures in his clinic. The patient finally underwent her surgery on 12/15/2019. This went reasonably well and there was a good anatomic outcome. She developed a small distal wound dehiscence on the lateral part of the surgical wound. However more problematically she is developed recurrence of the wound on the medial left ankle. There are actually 2 wounds here one in the distal lower leg and 1 pretty much at the level of the medial malleolus. It is a more distal area that is more problematic. She has been using Hydrofera Blue which started on Friday before this she was  simply Ace wrapping. There was a culture done that showed Pseudomonas and she is on ciprofloxacin. A recent CNS on 8/11 was negative. The patient reports some pain but I generally think this is improving. She is using a cam boot completely nonweightbearing using a walker for pivot transfers and a wheelchair 8/24; not much improvement unfortunately she has a surgical wound on the lateral part in the venous insufficiency wound medially. The bottom part of the medial insufficiency wound is still necrotic there is exposed tendon here. We have been using Hydrofera Blue under compression. Her edema control is however better 8/31; patient in for follow-up of his surgical wound on the lateral part of her left leg and chronic venous insufficiency ulcers medially. We put her back in compression last week. She comes in today with a complaint of 3 or 4 days worth of increasing pain. She felt her cam walker was rubbing on the area on the back of her heel. However there is intense erythema seems more likely she has cellulitis. She had 2 cultures done when she was seeing podiatry in the postop. One of them in late July showed Pseudomonas and she received a course of ciprofloxacin the other was negative on 8/11 she is allergic to penicillin with anaphylactoid complaints of hives oral swelling via information  in epic 9/9; when I saw this patient last week she had intense anterior erythema around her wound on the right lateral heel and ankle and also into the right medial heel. Some of this was no doubt drainage and her walker boot however I was convinced she had cellulitis. I gave her Levaquin and Bactrim she is finishing up on this now. She is following up with Dr. [name] he saw her yesterday. He is taken her out of the walking boot of course she is still nonweightbearing. Her x-ray was negative for any worrisome features such as soft tissue air etc. Things are a lot better this week. She has home health. We have been  using Hydrofera Blue under an [employer] which she put back on yesterday. I did not wrap her last week 9/17; her surrounding skin looks a lot better. In fact the area on the left lateral ankle has just a scant amount of eschar. The only remaining wound is the large area on the left medial ankle. Probably about 60% of this is healthy granulation at the surface however she has a significant divot distally. This has adherent debris in it. I been using debridement and silver collagen to try and get this area to fill-in although I do not think we have made much progress this week 9/24; the patient's wound on the left medial ankle looks a lot better. The deeper divot area distally still requires debridement but this is cleaning up quite nicely we have been using silver collagen. The patient is complaining of swelling in her foot and is worried that that is contributing to the nonhealing of the ankle wound. She is also complaining of numbness in her anterior toes 10/4; left medial ankle. The small area distally still has a divot with necrotic material that I have been debriding away. This has an undermining area. She is approved for Apligraf. She saw Dr. [name] her surgeon on 10/1. I think he declared himself is satisfied with the condition of things. Still nonweightbearing till the end of the month. 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 Her surgical wound is well closed. There is no evidence of infection 10/11; the patient arrived in clinic today with the expectation that we be able to put an Apligraf on this area after debridement however she arrives with a relatively large amount of green drainage on the dressing. The patient states that this started on Friday. She has not been systemically unwell. 10/19; culture I did last week showed both Enterococcus and Pseudomonas. I think this came in separate parts because I stopped her ciprofloxacin I gave her and prescribed her  linezolid however now looking at the final culture result this is Pseudomonas which is resistant to quinolones. She has not yet picked up the linezolid apparently phone issues. We are also trying to get a topical antibiotic out of [place] in [place] they can be applied by home health. She is still having green drainage 10/16; the patient has her topical antibiotic from [facility] in [place]. This is a compounded gel with vancomycin and ciprofloxacin and gentamicin. We are applying this on the wound bed with silver alginate over the top with Unna boot wraps. She arrives in clinic today with a lot less ominous looking drainage although she is only use this topical preparation once the second time today. She sees Dr. [name] her surgeon on Friday she has home health changing the dressing 11/2; still using her compounded topical  antibiotic under silver alginate. Surface is cleaning up there is less drainage. We had an Apligraf for her today and I elected to apply it. A light coating of her antibiotic 04/25/2020 upon evaluation today patient appears to be doing well with regard to her ankle ulcer. There is a little bit of slough noted on the surface of the wound I am can have to perform sharp debridement to clear this away today. With that being said other than that fact overall I feel like she is making progress and we do see some new epithelial growth. There is also some improvement in the depth of the wound and that distal portion. There is little bit of slough there as well. 12/7; 2-week follow-up. Apligraf #3. Dimensions are smaller. Closing in especially inferiorly. Still some surface debris. Still using the [facility] topical antibiotic but I told her that I don't think this needs to be renewed 12/21; 2-week follow-up. Apligraf #4 dimensions are smaller. Nice improvement 06/05/2020; 2-week follow-up. The patient's wound on the left medial ankle looks really excellent. Nice granulation.  Advancing epithelialization no undermining no evidence of infection. We would have to reapply for another Apligraf but with the condition of this wound I did not feel strongly about it. We used Hydrofera Blue under the same degree of compression. She follows up with Dr. [name] her surgeon a week Friday 06/13/2020 upon evaluation today patient appears to be doing excellent in regard to her wound. She has been tolerating the dressing changes without complication. Fortunately there is no signs of active infection at this time. No fevers, chills, nausea, vomiting, or diarrhea. She was using Hydrofera Blue last week. 06/20/2020 06/20/2020 on evaluation today patient actually appears to be doing excellent in regard to her wound. This is measuring better and looking much better as well. She has been using the collagen that seems to be doing better for her as well even though the [facility] was and is not sticking or feeling as rough on her wound. She did see Dr. [name] on Friday he is very pleased he also stated none of the hardware has shifted. That is great news Electronic Signature(s) Signed: 06/20/2020 1:47:06 PM By: [name] PA-C Entered By: [name] on 06/20/2020 13:47:06 -------------------------------------------------------------------------------- Physical Exam Details Patient Name: Date of Service: [facility] [name], [name] 06/20/2020 12:45 PM Medical Record Number: [id number] Patient Account Number: [id number] Date of Birth/Sex: Treating RN: [birth year] (65 y.o. [name] Provider: [name] Other Clinician: Referring Provider: Treating Provider/Extender: [name] in Treatment: 50 Constitutional Well-nourished and well-hydrated in no acute distress. Respiratory normal breathing without difficulty. Psychiatric this patient is able to make decisions and demonstrates good insight into disease process. Alert and Oriented x 3.  pleasant and cooperative. Notes On inspection patient's wound bed did not require any sharp debridement today I did remove some of the dry skin around the edges of the wound but again nothing that in the central part of the wound was even necessary. I do think the collagen is doing a great job and overall her measurements are significantly improved. Electronic Signature(s) Signed: 06/20/2020 1:47:25 PM By: [name] PA-C Entered By: [name] on 06/20/2020 13:47:24 -------------------------------------------------------------------------------- Physician Orders Details Patient Name: Date of Service: [name], [name] 06/20/2020 12:45 PM Medical Record Number: [id number] Patient Account Number: [id number] Date of Birth/Sex: Treating RN: 09-06-1955 (65 y.o. [name] Provider: [name]  Other Clinician: Referring Provider: Treating Provider/Extender: [name] in Treatment: 22 Verbal / Phone Orders: No Diagnosis Coding ICD-10 Coding Code Description I87.332 Chronic venous hypertension (idiopathic) with ulcer and inflammation of left lower extremity L97.828 Non-pressure chronic ulcer of other part of left lower leg with other specified severity L97.328 Non-pressure chronic ulcer of left ankle with other specified severity Follow-up Appointments ppointment in 1 week. - Tues. with Dr. Dellia Nims Return A Bathing/ Shower/ Hygiene May shower with protection but do not get wound dressing(s) wet. Edema Control - Lymphedema / SCD / Other Elevate legs to the level of the heart or above for 30 minutes daily and/or when sitting, a frequency of: Avoid standing for long periods of time. Exercise regularly Compression stocking or Garment 20-30 mm/Hg pressure to: - right leg daily Wound Treatment Wound #15 - Malleolus Wound Laterality: Left, Medial Cleanser: Soap and Water 1 x Per Week/15 Days Discharge Instructions: May shower and  wash wound with dial antibacterial soap and water prior to dressing change. Peri-Wound Care: Triamcinolone 15 (g) 1 x Per Week/15 Days Discharge Instructions: Use triamcinolone 15 (g) as directed Peri-Wound Care: Sween Lotion (Moisturizing lotion) 1 x Per Week/15 Days Discharge Instructions: Apply moisturizing lotion as directed Prim Dressing: Promogran Prisma Matrix, 4.34 (sq in) (silver collagen) 1 x Per Week/15 Days ary Discharge Instructions: Moisten collagen with saline or hydrogel Secondary Dressing: Woven Gauze Sponge, Non-Sterile 4x4 in (Home Health) 1 x Per Week/15 Days Discharge Instructions: Apply over primary dressing as directed. Secondary Dressing: ABD Pad, 5x9 (Home Health) 1 x Per Week/15 Days Discharge Instructions: Apply over primary dressing as directed. Compression Wrap: Unnaboot w/Calamine, 4x10 (in/yd) (Home Health) 1 x Per Week/15 Days Discharge Instructions: Apply Unnaboot as directed. Electronic Signature(s) Signed: 06/20/2020 5:51:30 PM By: Baruch Gouty RN, BSN Signed: 06/20/2020 6:05:28 PM By: [name] PA-C Entered By: Baruch Gouty on 06/20/2020 13:41:38 -------------------------------------------------------------------------------- Problem List Details Patient Name: Date of Service: [facility] [name], [name] 06/20/2020 12:45 PM Medical Record Number: [id number] Patient Account Number: [id number] Date of Birth/Sex: Treating RN: 1955-08-23 (65 y.o. [name] Provider: [name] Other Clinician: Referring Provider: Treating Provider/Extender: [name] in Treatment: 22 Active Problems ICD-10 Encounter Code Description Active Date MDM Diagnosis I87.332 Chronic venous hypertension (idiopathic) with ulcer and inflammation of left 01/17/2020 No Yes lower extremity L97.828 Non-pressure chronic ulcer of other part of left lower leg with other specified 01/17/2020 No Yes severity L97.328 Non-pressure  chronic ulcer of left ankle with other specified severity 01/17/2020 No Yes Inactive Problems ICD-10 Code Description Active Date Inactive Date L03.116 Cellulitis of left lower limb 01/31/2020 01/31/2020 T81.31XD Disruption of external operation (surgical) wound, not elsewhere classified, subsequent 01/17/2020 01/17/2020 encounter Resolved Problems Electronic Signature(s) Signed: 06/20/2020 1:10:10 PM By: [name] PA-C Entered By: [name] on 06/20/2020 13:10:10 -------------------------------------------------------------------------------- Progress Note Details Patient Name: Date of Service: [facility] [name], [name] 06/20/2020 12:45 PM Medical Record Number: [id number] Patient Account Number: [id number] Date of Birth/Sex: Treating RN: Oct 24, 1955 (64 y.o. [name] Provider: [name] Other Clinician: Referring Provider: Treating Provider/Extender: [name] in Treatment: 22 Subjective Chief Complaint Information obtained from Patient patient is been followed long-term in this clinic for venous insufficiency ulcers with inflammation, hypertension and ulceration over the medial ankle bilaterally. 01/17/2020; this is a patient who is here for review of postoperative wounds on the left lateral ankle and  recurrence of venous stasis ulceration on the left medial History of Present Illness (HPI) the remaining wound is over the left medial ankle. Similar wound over the right medial ankle healed largely with use of Apligraf. Most recently we have been using Hydrofera Blue over this wound with considerable improvement. The patient has been extensively worked up in the past for her venous insufficiency and she is not a candidate for antireflux surgery although I have none of the details available currently. 08/24/14; considerable improvement today. About 50% of this wound areas now epithelialized. The base of the wound appears to be  healthier granulation.as opposed to last week when she had deteriorated a considerable improvement 08/17/14; unfortunately the wound has regressed somewhat. The areas of epithelialization from the superior aspect are not nearly as healthy as they were last week. The patient thinks her Hydrofera Blue slipped. 09/07/14; unfortunately the area has markedly regressed in the 2 weeks since I've seen this. There is an odor surrounding erythema. The healthy granulation tissue that we had at the base of the wound now is a dusky color. The nurse reports green drainage 09/14/14; the area looks somewhat better than last week. There is less erythema and less drainage. The culture I did did not show any growth. Nevertheless I think it is better to continue the Cipro and doxycycline for a further week. The remaining wound area was debridement. 09/21/14. Wound did not require debridement last week. Still less erythema and less drainage. She can complete her antibiotics. The areas of epithelialization in the superior aspect of the wound do not look as healthy as they did some weeks ago 10/05/14 continued improvement in the condition of this wound. There is advancing epithelialization. Less aggressive debridement required 10/19/14 continued improvement in the condition and volume of this wound. Less aggressive debridement to the inferior part of this to remove surface slough and fibrinous eschar 11/02/14 no debridement is required. The surface granulation appears healthy although some of her islands of epithelialization seem to have regressed. No evidence of infection 11/16/14; lites surface debridement done of surface eschar. The wound does not look to be unhealthy. No evidence of infection. Unfortunately the patient has had podiatry issues in the right foot and for some reason has redeveloped small surface ulcerations in the medial right ankle. Her original presentation involved wounds in this area 11/23/14 no debridement.  The area on the right ankle has enlarged. The left ankle wound appears stable in terms of the surface although there is periwound inflammation. There has been regression in the amount of new skin 11/30/14 no debridement. Both wound areas appear healthy. There was no evidence of infection. The the new area on the right medial ankle has enlarged although that both the surfaces appear to be stable. 12/07/14; Debridement of the right medial ankle wound. No no debridement was done on the left. 12/14/14 no major change in and now bilateral medial ankle wounds. Both of these are very painful but the no overt evidence of infection. She has had previous venous ablation 12/21/14; patient states that her right medial ankle wound is considerably more painful last week than usual. Her left is also somewhat painful. She could not tolerate debridement. The right medial ankle wound has fibrinous surface eschar 12/28/14 this is a patient with severe bilateral venous insufficiency ulcers. For a considerable period of time we actually had the one on the right medial ankle healed however this recently opened up again in June. The left medial ankle wound has been  a refractory area with some absent flows. We had some success with Hydrofera Blue on this area and it literally closed by 50% however it is recently opened up Foley. Both of these were debridement today of surface eschar. She tolerates this poorly 01/25/15: No change in the status of this. Thick adherent escar. Very poor tolerance of any attempt at debridement. I had healed the right medial malleolus wound for a considerable amount of time and had the left one down to about 50% of the volume although this is totally regressed over the last 48 weeks. Further the right leg has reopened. she is trying to make a appointment with pain and vascular, previous ablations with Dr. Aleda Grana. I do not believe there is an arterial insufficiency issue here 02/01/15 the status of  the adherent eschar bilaterally is actually improved. No debridement was done. She did not manage to get vascular studies done 02/08/15 continued debridement of the area was done today. The slough is less adherent and comes off with less pressure. There is no surrounding infection peripheral pulses are intact 02/15/15 selective debridement with a disposable curette. Again the slough is less adherent and comes off with less difficulty. No surrounding infection peripheral pulses are intact. 02/22/15 selective debridement of the right medial ankle wound. Slough comes off with less difficulty. No obvious surrounding infection peripheral pulses are intact I did not debridement the one on the left. Both of these are stable to improved 03/01/15 selective debridement of both wound areas using a curette to. Adherent slough cup soft with less difficulty. No obvious surrounding infection. The patient tells me that 2 days ago she noted a rash above the right leg wrap. She did not have this on her lower legs when she change this over she arrives with widespread left greater than right almost folliculitis-looking rash which is extremely pruritic. I don't see anything to culture here. There is no rash on the rest of her body. She feels well systemically. 03/08/15; selective debridement of both wounds using a curette. Base of this does not look unhealthy. She had limegreen drainage coming out of the left leg wound and describes a lot of drainage. The rash on her left leg looks improved to. No cultures were done. 03/22/15; patient was not here last week. Basal wounds does not look healthy and there is no surrounding erythema. No drainage. There is still a rash on the left leg that almost looks vasculitic however it is clearly limited to the top of where the wrap would be. 04/05/15; on the right required a surgical debridement of surface eschar and necrotic subcutaneous tissue. I did not debridement the area on the left.  These continue to be large open wounds that are not changing that much. We were successful at one point in healing the area on the right, and at the same time the area on the left was roughly half the size of current measurements. I think a lot of the deterioration has to do with the prolonged time the patient is on her feet at work 04/19/15 I attempted-like surface debridement bilaterally she does not tolerate this. She tells me that she was in allergic care yesterday with extreme pain over her left lateral malleolus/ankle and was told that she has an "sprain" 05/03/15; large bilateral venous insufficiency wounds over the medial malleolus/medial aspect of her ankles. She complains of copious amounts of drainage and his usual large amounts of pain. There is some increasing erythema around the wound on the right  extending into the medial aspect of her foot to. historically she came in with these wounds the right one healed and the left one came down to roughly half its current size however the right one is reopened and the left is expanded. 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 05/10/15 large bilateral venous insufficiency wounds. There is less adherence surface left however the surface culture that I did last week grew pseudomonas therefore bilateral selective debridement score necessary. There is surrounding erythema. The patient describes severe bilateral drainage and a lot of pain in the left ankle. Apparently her podiatrist was were ready to do a cortisone shot 05/17/15; the patient complains of pain and again copious amounts of drainage. 05/24/15; we used Iodo flex last week. Patient notes considerable improvement in wound drainage. Only needed to change this once. 05/31/15; we continued Iodoflex; the base of these large wounds bilaterally is not too bad but there is probably likely a significant bioburden here. I would like to debridement  just doesn't tolerate it. 06/06/14 I would like to continue the Iodoflex although she still hasn't managed to obtain supplies. She has bilateral medial malleoli or large wounds which are mostly superficial. Both of them are covered circumferentially with some nonviable fibrinous slough although she tolerates debridement very poorly. She apparently has an appointment for an ablation on the right leg by interventional radiology. 06/14/15; the patient arrives with the wounds and static condition. We attempted a debridement although she does not do well with this secondary to pain. I 07/05/15; wounds are not much smaller however there appears to be a cleaner granulating base. The left has tight fibrinous slough greater than the right. Debridement is tolerated poorly due to pain. Iodoflex is done more for these wounds in any of the multitude of different dressings I have tried on the left 1 and then subsequently the right. 07/12/15; no change in the condition of this wound. I am able to do an aggressive debridement on the right but not the left. She simply cannot tolerate it. We have been using Iodoflex which helps somewhat. It is worthwhile remembering that at one point we healed the right medial ankle wound and the left was about 25% of the current circumference. We have suggested returning to vascular surgery for review of possible further ablations for one reason or another she has not been able to do this. 07/26/15 no major change in the condition of either wound on her medial ankle. I did not attempt to debridement of these. She has been aggressively scrubbing these while she is in the shower at home. She has her supply of Iodoflex which seems to have done more for these wounds then anything I have put on recently. 08/09/15 wound area appears larger although not verified by measurements. Using Iodoflex 09/05/2015 -- she was here for avisit today but had significant problems with the wound and I was asked to  see her for a physician opinion. I have summarize that this lady has had surgery on her left lower extremity about 10 years ago where the possible veins stripping was done. She has had an opinion from interventional radiology around November 2016 where no further sclerotherapy was ordered. The patient works 12 hours a day and stands on a concrete floor with work boots and is unable to get the proper compression she requires and cannot elevate her limbs appropriately at any given time. She has recently  grown Pseudomonas from her wound culture but has not started her ciprofloxacin which was called in for her. 09/13/15 this continues to be a difficult situation for this patient. At one point I had this wound down to a 1.5 x 1.5" wound on her left leg. This is deteriorated and the right leg has reopened. She now has substantial wounds on her medial calcaneus, malleoli and into her lower leg. One on the left has surface eschar but these are far too painful for me to debridement here. She has a vascular surgery appointment next week to see if anything can be done to help here. I think she has had previous ablations several years ago at [facility]. She has no major edema. She tells me that she did not get product last time [facility] and went for several days without it. She continues to work in work boots 12 hours a day. She cannot get compression/4-layer under her work boots. 09/20/15 no major change. Periwound edema control was not very good. Her point with pain and vascular is next Wednesday the 25th 09/28/15; the patient is seen vascular surgery and is apparently scheduled for repeat duplex ultrasounds of her bilateral lower legs next week. 10/05/15; the patient was seen by Dr. Doren Custard of vascular surgery. He feels that she should have arterial insufficiency excluded as cause/contributed to her nonhealing stage she is therefore booked for an arteriogram. She has apparently monophasic signals in the  dorsalis pedis pulses. She also of course has known severe chronic venous insufficiency with previous procedures as noted previously. I had another long discussion with the patient today about her continuing to work 12 hour shifts. I've written her out for 2 months area had concerns about this as her work location is currently undergoing significant turmoil and this may lead to her termination. She is aware of this however I agree with her that she simply cannot continue to stand for 12 hours multiple days a week with the substantial wound areas she has. 10/19/15; the Dr. Doren Custard appointment was largely for an arteriogram which was normal. She does not have an arterial issue. He didn't make a comment about her chronic venous insufficiency for which she has had previous ablations. Presumably it was not felt that anything additional could be done. The patient is now out of work as I prescribed 2 weeks ago. Her wounds look somewhat less aggravated presumably because of this. I felt I would give debridement another try today 10/25/15; no major change in this patient's wounds. We are struggling to get her product that she can afford into her own home through her insurance. 11/01/15; no major change in the patient's wounds. I have been using silver alginate as the most affordable product. I spoke to Dr. Marla Roe last week with her requested take her to the OR for surgical debridement and placement of ACEL. Dr. Marla Roe told me that she would be willing to do this however [facility] will not cover this, fortunately the patient has [place] healthcare of some variant 11/08/15; no major change in the patient's wounds. She has been completely nonviable surface that this but is in too much pain with any attempted debridement are clinic. I have arranged for her to see Dr. Marla Roe ham of plastic surgery and this appointment is on Monday. I am hopeful that they will take her to the OR for debridement,  possible ACEL ultimately possible skin graft 11/22/15 no major change in the patient's wounds over her bilateral medial  calcaneus medial malleolus into the lower legs. Surface on these does not look too bad however on the left there is surrounding erythema and tenderness. This may be cellulitis or could him sleepy tinea. 11/29/15; no major changes in the patient's wounds over her bilateral medial malleolus. There is no infection here and I don't think any additional antibiotics are necessary. There is now plan to move forward. She sees Dr. Marla Roe in a week's time for preparation for operative debridement and ACEL placement I believe on 7/12. She then has a follow-up appointment with Dr. Marla Roe on 7/21 12/28/15; the patient returns today having been taken to the [facility] by Dr. Marla Roe 12/12/15 she underwent debridement, intraoperative cultures [which were negative]. She had placement of a wound VAC. Parent really ACEL was not available to be placed. The wound VAC foam apparently adhered to the wound since then she's been using silver alginate, Xeroform under Ace wraps. She still says there is a lot of drainage and a lot of pain 01/31/16; this is a patient I see monthly. I had referred her to Dr. Marla Roe him of plastic surgery for large wounds on her bilateral medial ankles. She has been to the OR twice once in early July and once in early August. She tells me over the last 3 weeks she has been using the wound VAC with ACEL underneath it. On the right we've simply been using silver alginate. Under Kerlix Coban wraps. 02/28/16; this is a patient I'm currently seeing monthly. She is gone on to have a skin graft over her large venous insufficiency ulcer on the left medial ankle. This was done by Dr. Marla Roe him. The patient is a bit perturbed about why she didn't have one on her right medial ankle wound. She has been using silver alginate to this. 03/06/16; I received a phone call from her plastic  surgery Dr. Marla Roe. She expressed some concern about the viability of the skin graft she did on the left medial ankle wound. Asked me to place Endoform on this. She told me she is not planning to do a subsequent skin graft on the right as the left one did not take very well. I had placed Hydrofera Blue on the right 03/13/16; continue to have a reasonably healthy wound on the right medial ankle. Down to 3 mm in terms of size. There is epithelialization here. The area on the left medial ankle is her skin graft site. I suppose the last week this looks somewhat better. She has an open area inferiorly however in the center there appears to be some viable tissue. There is a lot of surface callus and eschar that will eventually need to come off however none of this looked to be infected. Patient states that the is able to keep the dressing on for several days which is an improvement. 03/20/16 no major change in the circumference of either wound however on the left side the patient was at Dr. Eusebio Friendly office and they did a debridement of left wound. 50% of the wound seems to be epithelialized. I been using Endoform on the left Hydrofera Blue in the right 03/27/16; she arrives today with her wound is not looking as healthy as they did last week. The area on the right clearly has an adherent surface to this a very similar surface on the left. Unfortunately for this patient this is all too familiar problem. Clearly the Endoform is not working and will need to change that today that has some potential to  help this surface. She does not tolerate debridement in this clinic very well. She is changing the dressing wants 04/03/16; patient arrives with the wounds looking somewhat better especially on the right. Dr. Migdalia Dk change the dressing to silver alginate when she saw her on Monday and also sold her some compression socks. The usefulness of the latter is really not clear and woman with severely draining  wounds. 04/10/16; the patient is doing a bit of an experiment wearing the compression stockings that Dr. Migdalia Dk provided her to her left leg and the out of legs based dressings that we provided to the right. 05/01/16; the patient is continuing to wear compression stockings Dr. Migdalia Dk provided her on the left that are apparently silver impregnated. She has been using Iodoflex to the right leg wound. Still a moderate amount of drainage, when she leaves here the wraps only last for 4 days. She has to change the stocking on the left leg every night 05/15/16; she is now using compression stockings bilaterally provided by Dr. Marla Roe. She is wearing a nonadherent layer over the wounds so really I don't think there is anything specific being done to this now. She has some reduction on the left wound. The right is stable. I think all healing here is being done without a specific dressing 06/09/16; patient arrives here today with not much change in the wound certainly in diameter to large circular wounds over the medial aspect of her ankle bilaterally. Under the light of these services are certainly not viable for healing. There is no evidence of surrounding infection. She is wearing compression stockings with some sort of silver impregnation as prescribed by Dr. Marla Roe. She has a follow-up with her tomorrow. 06/30/16; no major change in the size or condition of her wounds. These are still probably covered with a nonviable surface. She is using only her purchase stockings. She did see Dr. Marla Roe who seemed to want to apply Dakin's solution to this I'm not extreme short what value this would be. I would suggest Iodoflex which she still has at home. 07/28/16; I follow Mrs. Sirles episodically along with Dr. Marla Roe. 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 She has been applying a topical collagen ointment to both wounds with Adaptic. I  don't think Dr. Marla Roe is planning to take her back to the OR. 08/19/16; I follow Mrs. Jeneen Rinks on a monthly basis along with Dr. Marla Roe of plastic surgery. 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 I been following her for a number of years. At one point I was able to get the right medial malleolus wound to heal and had the left medial malleolus down to about half its current size however and I had to send her to plastic surgery for an operative debridement. Since then things have been stable to slightly improve the area on the right is slightly better one in the left about the same although there is much less adherent surface than I'm used to with this patient. She is using some form of liquid collagen gel that Dr. Marla Roe provided a Kerlix cover with the patient's own pressure stockings. She tells me that she has extreme pain in both ankles and along the lateral aspect of both feet. She has been unable to work for some period of time. She is telling me she is retiring at the beginning of April. She sees Dr. Doran Durand of  orthopedics next week 09/22/16; patient has not seen Dr. Marla Roe since the last time she is here. I'm not really sure what she is using to the wounds other than bits and pieces of think she had left over including most recently Hydrofera Blue. She is using juxtalite stockings. She is having difficulty with her husband's recent illness "stroke". She is having to transport him to various doctors appointments. Dr. Marla Roe left her the option of a repeat debridement with ACEL however she has not been able to get the time to follow-up on this. She continues to have a fair amount of drainage out of these wounds with certainly precludes leaving dressings on all week 10/13/16; patient has not seen Dr. Marla Roe since she was last in our clinic. I'm not really sure what she is doing with the wounds, we did try to  get her [facility] and I think she is actually using this most of the time. Because of drainage she states she has to change this every second day although this is an improvement from what she used to do. She went to see Dr. Doran Durand who did not think she had a muscular issue with regards to her feet, he referred her to a neurologist and I think the appointment is sometime in June. I changed her back to Iodoflex which she has used in the past but not recently. 11/03/16; the patient has been using Iodoflex although she ran out of this. Still claims that there is a lot of drainage although the wound does not look like this. No surrounding erythema. She has not been back to see Dr. Marla Roe 11/24/16; the patient has been using Iodoflex again but she ran out of it 2 or 3 days ago. There is no major change in the condition of either one of these wounds in fact they are larger and covered in a thick adherent surface slough/nonviable tissue especially on the left. She does not tolerate mechanical debridement in our clinic. Going back to see Dr. Marla Roe of plastic surgery for an operative debridement would seem reasonable. 12/15/16; the patient has not been back to see Dr. Marla Roe. She is been dealing with a series of illnesses and her husband which of monopolized her time. She is been using Sorbact which we largely supplied. She states the drainage is bad enough that it maximum she can go 2-3 days without changing the dressing 01/12/2017 -- the patient has not been back for about 4 weeks and has not seen Dr. Marla Roe not does she have any appointment pending. 01/23/17; patient has not seen Dr. Marla Roe even though I suggested this previously. She is using Santyl that was suggested last week by Dr. Con Memos this Cost her $16 through her insurance which is indeed surprising 02/12/17; continuing Santyl and the patient is changing this daily. A lot of drainage. She has not been back to see plastic surgery  she is using an Ace wrap. Our intake nurse suggested wrap around stockings which would make a good reasonable alternative 02/26/17; patient is been using Santyl and changing this daily due to drainage. She has not been to see plastic surgery she uses in April Ace wrap to control the edema. She did obtain extremitease stockings but stated that the edema in her leg was to big for these 03/20/17; patient is using Santyl and Anasept. Surfaces looked better today the area on the right is actually measuring a little smaller. She has states she has a lot of pain in her feet and  ankles and is asking for a consult to pain control which I'll try to help her with through our case manager. 04/10/17; the patient arrives with better-looking wound surfaces and is slightly smaller wound on the left she is using a combination of Santyl and Anasept. She has an appointment or at least as started in the pain control center associated with [facility] 05/14/17; this is a patient who I followed for a prolonged period of time. 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 At one point I had this down to a much smaller wound on the left however these reopened and we've never been able to get these to heal. She has been using Santyl and Anasept gel although 2 weeks ago she ran out of the Anasept gel. She has a stable appearance of the wound. She is going to the wound care clinic at [facility]. They wanted do a nerve block/spinal block although she tells me she is reluctant to go forward with that. 05/21/17; this is a patient I have followed for many years. 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 Chronic pain and deformity in her ankles as well. She is been to see plastic surgery as well as orthopedics. Using PolyMem AG most recently/Kerramax/ABDs and 2 layer compression. She has managed to keep this on and she is coming in for a nurse check to change the dressing on  Tuesdays, we see her on Fridays 06/05/17; really quite a good looking surface and the area especially on the right medial has contracted in terms of dimensions. Well granulated healthy-looking tissue on both sides. Even with an open curet there is nothing that even feels abnormal here. This is as good as I've seen this in quite some time. We have been using PolyMem AG and bringing her in for a nurse check 06/12/17; really quite good surface on both of these wounds. The right medial has contracted a bit left is not. We've been using PolyMem and AG and she is coming in for a nurse visit 06/19/17; we have been using PolyMem AG and bringing her in for a nurse check. Dimensions of her wounds are not better but the surfaces looked better bilaterally. She complained of bleeding last night and the left wound and increasing pain bilaterally. She states her wound pain is more neuropathic than just the wounds. There was some suggestion that this was radicular from her pain management doctor in talking to her it is really difficult to sort this out. 06/26/17; using PolyMem and AG and bringing her in for a nurse check as All of this and reasonably stable condition. Certainly not improved. The dimensions on the lateral part of the right leg look better but not really measuring better. The medial aspect on the left is about the same. 07/03/16; we have been using PolyMen AG and bringing her in for a nurse check to change the dressings as the wounds have drainage which precludes once weekly changing. We are using all secondary absorptive dressings.our intake nurse is brought up the idea of using a wound VAC/snap VAC on the wound to help with the drainage to see if this would result in some contraction. This is not a bad idea. The area on the right medial is actually looking smaller. Both wounds have a reasonable-looking surface. There is no evidence of cellulitis. The edema is well controlled 07/10/17; the patient was denied  for a snap VAC by her insurance. The major issue with these  wounds continues to be drainage. We are using wicked PolyMem AG and she is coming in for a nurse visit to change this. The wounds are stable to slightly improved. The surface looks vibrant and the area on the right certainly has shrunk in size but very slowly 07/17/17; the patient still has large wounds on her bilateral medial malleoli. Surface of both of these wounds looks better. The dimensions seem to come and go but no consistent improvement. There is no epithelialization. We do not have options for advanced treatment products due to insurance issues. They did not approve of the wound VAC to help control the drainage. More recently we've been using PolyMem and AG wicked to allow drainage through. We have been bringing her in for a nurse visit to change this. We do not have a lot of options for wound care products and the home again due to insurance issues 07/24/17; the patient's wound actually looks somewhat better today. No drainage measurements are smaller still healthy-looking surface. We used silver collagen under PolyMen started last week. We have been bringing her in for a dressing change 07/31/17; patient's wound surface continued to look better and I think there is visible change in the dimensions of the wound on the right. Rims of epithelialization. We have been using silver collagen under PolyMen and bringing her in for a dressing change. There appears to be less drainage although she is still in need of the dressing change 08/07/17. Patient's wound surface continues to look better on both sides and the area on the right is definitely smaller. We have been using silver collagen and PolyMen. She feels that the drainage has been it has been better. I asked her about her vascular status. She went to see Dr. Aleda Grana at [facility] and had some form of ablation. I don't have much detail on this. I haven't my notes from 2016 that she  was not a candidate for any further ablation but I don't have any more information on this. We had referred her to vein and vascular I don't think she ever went. He does not have a history of PAD although I don't have any information on this either. We don't even have ABIs in our record 08/14/17; we've been using silver collagen and PolyMen cover. And putting the patient and compression. She we are bringing her in as a nurse visit to change this because ofarge amount of drainage. We didn't the ABIs in clinic today since they had been done in many moons 1.2 bilaterally. She has been to see vein and vascular however this was at [facility] and she had ablation although I really don't have any information on this all seemed biking get a report. She is also been operatively debrided by plastic surgery and had a cell placed probably 8-12 months ago. This didn't have a major effect. We've been making some gains with current dressings 08/19/17-She is here in follow-up evaluation for bilateral medial malleoli ulcers. She continues to tolerate debridement very poorly. We will continue with recently changed topical treatment; if no significant improvement may consider switching to Iodosorb/Iodoflex. 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 She has been using silver collagen and PolyMem. I believe she has been used and tried on Iodoflex before. During her trip to the clinic we've been watching her wound with Anasept spray and I would like to encourage this on thenurse visit days 09/04/17 bilateral medial malleoli ulcers area is her chronic related  to chronic venous insufficiency. These have been very refractory over time. We have been using silver collagen and PolyMen. She is coming in once a week for a doctor's and once a week for nurse visits. We are actually making some progress 09/18/17; the patient's wounds are smaller especially on the right medial. She arrives  today to upset to consider even washing these off with Anasept which I think is been part of the reason this is been closing. We've been using collagen covered in PolyMen otherwise. It is noted that she has a small area of folliculitis on the right medial calf that. As we are wrapping her legs I'll give her a short course of doxycycline to make sure this doesn't amount to anything. She is a long list of complaints today including imbalance, shortness of breath on exertion, inversion of her left ankle. With regards to the latter complaints she is been to see orthopedics and they offered her a tendon release surgery I believe but wanted her wounds to be closed first. I have recommended she go see her primary physician with regards to everything else. 09/25/17; patient's wounds are about the same size. We have made some progress bilaterally although not in recent weeks. She will not allow me T wash these wounds with Anasept even if she is doing her cell. Wheeze we've been using collagen covered in PolyMen. Last week she had a small area of folliculitis this is now opened into a small wound. She completed 5 days of trimethoprim sulfamethoxazole 10/02/17; unfortunately the area on her left medial ankle is worse with a larger wound area towards the Achilles. The patient complains of a lot of pain. She will not allow debridement although visually I don't think there is anything to debridement in any case. We have been using silver collagen and PolyMen for several months now. Initially we are making some progress although I'm not really seeing that today. We will move back to [facility]. His admittedly this is a bit of a repeat however I'm hoping that his situation is different now. The patient tells me she had her leg on the left give out on her yesterday this is process some pain. 10/09/17; the patient is seen twice a week largely because of drainage issues coming out of the chronic medial bimalleolar wounds  that are chronic. Last week the dimensions of the one on the left looks a little larger I changed her to [facility]. She comes in today with a history of terrible pain in the bilateral wound areas. She will not allow debridement. She will not even allow a tissue culture. There is no surrounding erythema no no evidence of cellulitis. We have been putting her Kerlix Coban man. She will not allow more aggressive compression as there was a suggestion to put her in 3 layer wraps. 10/16/17; large wounds on her bilateral medial malleoli. These are chronic. Not much change from last week. The surface looks have healthy but absolutely no epithelialization. A lot of pain little less so of drainage. She will not allow debridement or even washing these off in the vigorous fashion with Anasept. 10/23/17; large wounds on her bilateral malleoli which are chronic. Some improvement in terms of size perhaps on the right since last time I saw these. She states that after we increased the 3 layer compression there was some bleeding, when she came in for a nurse visit she did not want 3 layer compression put back on about our nurse managed to  convince her. She has known chronic venous visit issues and I'm hoping to get her to tolerate the 3 layer compression. using Hydrofera Blue 10/30/17; absolutely no change in the condition of either wound although we've had some improvement in dimensions on the right.. Attempted to put her in 3 layer compression she didn't tolerated she is back in 2 layer compression. We've been using Hydrofera Blue We looked over her past records. She had venous reflux studies in November 2016. There was no evidence of deep venous reflux on the right. Superficial vein did not show the greater saphenous vein at think this is been previously ablated the small saphenous vein was within normal limits. The left deep venous system showed no DVT the vessels were positive for deep venous reflux in the  posterior tibial veins at the ankle. The greater saphenous vein was surgically absent small saphenous vein was within normal limits. She went to vein and vascular at [facility]. I believe she had an ablation on the left greater saphenous vein. I'll update her reflux studies perhaps ever reviewed by vein and vascular. We've made absolutely no progress in these wounds. Will also try to read and TheraSkins through her insurance 11/06/17; W the patient apparently has a 2 week follow-up with vein and vascular I like him to review the whole issue with regards to her previous vascular workup by Dr. Aleda Grana. We've really made no progress on these wounds in many months. She arrives today with less viable looking surface on the left medial ankle wound. This was apparently looking about the same on Tuesday when she was here for nurse visit. 11/13/17; deep tissue culture I did last time of the left lower leg showed multiple organisms without any predominating. In particular no Staphylococcus or group A strep were isolated. We sent her for venous reflux studies. She's had a previous left greater saphenous vein stripping and I think sclerotherapy of the right greater saphenous vein. She didn't really look at the lesser saphenous vein this both wounds are on the medial aspect. She has reflux in the common femoral vein and popliteal vein and an accessory vein on the right and the common femoral vein and popliteal vein on the left. I'm going to have her go to see vein and vascular just the look over things and see if anything else beside aggressive compression is indicated here. We have not been able to make any progress on these wounds in spite of the fact that the surface of the wounds is never look too bad. 11/20/17; no major change in the condition of the wounds. Patient reports a large amount of drainage. She has a lot of complaints of pain although enlisting her today I wonder if some of this at least his  neuropathic rather than secondary to her wounds. She has an appointment with vein and vascular on 12/30/17. The refractory nature of these wounds in my mind at least need vein and vascular to look over the wounds the recent reflux studies we did and her history to see if anything further can be done here. I also note her gait is deteriorated quite a bit. 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 She has a bilateral Trendelenburg gait. I wonder if this is neuropathic or perhaps multilevel radicular. 11/27/17; her wounds actually looks slightly better. Healthy-looking granulation tissue a scant amount of epithelialization. [place] healthcare will not pay for [employer]. They will play for tri layer Oasis and Dermagraft. This is not  a diabetic ulcer. We'll try for the tri layer Oasis. She still complains of some drainage. She has a vein and vascular appointment on 12/30/17 12/04/17; the wounds visually look quite good. Healthy-looking granulation with some degree of epithelialization. We are still waiting for response to our request for trial to try layer Oasis. Her appointment with vascular to review venous and arterial issues isn't sold the end of July 7/31. Not allow debridement or even vigorous cleansing of the wound surface. 12/18/17; slightly smaller especially on the right. 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 We've been using Hydrofera Blue. We still are looking into triple layer Oasis through her insurance 01/08/18 on evaluation today patient's wound actually appears to be showing signs of good improvement at this point in time. She has been tolerating the dressing changes without complication. Fortunately there does not appear to be any evidence of infection at this point in time. We have been utilizing silver nitrate which does seem to be of benefit for her which is also good news. Overall I'm very happy with how things seem to be both regards appearance as  well as measurement. Patient did see Dr. Bridgett Larsson for evaluation on 12/30/17. In his assessment he felt that stripping would not likely add much more than chronic compression to the patient's healing process. His recommendation was to follow-up in three months with Dr. Doren Custard if she hasn't healed in order to consider referral back to you and see vascular where she previously was in a trial and was able to get her wound to heal. I'll be see what she feels she when you staying compression and he reiterated this as well. 01/13/18 on evaluation today patient appears to actually be doing very well in regard to her bilateral medial malleolus ulcers. She seems to have tolerated the chemical cauterization with silver nitrate last week she did have some pain through that evening but fortunately states that I'll be see since it seems to be doing better she is overall pleased with the progress. 01/21/18; really quite a remarkable improvement since I've last seen these wounds. We started using silver nitrate specially on the islands of hyper granulation which for some reason her around the wound circumference. This is really done quite nicely. Primary dressing Hydrofera Blue under 4 layer compression. She seems to be able to hold out without a nurse rewrap. Follow-up in 1 week 01/28/18; we've continued the hydrofera blue but continued with chemical cauterization to the wound area that we started about a month ago for irregular hyper granulation. She is made almost stunning improvement in the overall wound dimensions. I was not really expecting this degree of improvement in these chronic wounds 02/05/18; we continue with Hydrofera Bluebut of also continued the aggressive chemical cauterization with silver nitrate. We made nice progress with the right greater than left wound. 02/12/18. We continued with Hydrofera Blue after aggressive chemical cauterization with silver nitrate. We appear to be making nice progress with both  wound areas 02/19/2018; we continue with [facility] after washing the wounds vigorously with Anasept spray and chemical cauterization with silver nitrate. We are making excellent progress. The area on the right's just about closed 02/26/2018. The area on the left medial ankle had too much necrotic debris today. I used a #5 curette we are able to get most of the soft. I continued with the silver nitrate to the much smaller wound on the right medial ankle she had a new area on her right lower pretibial area  which she says was due to a role in her compression 03/05/2018; both wound areas look healthy. Not much change in dimensions from last week. I continue to use silver nitrate and Hydrofera Blue. The patient saw Dr. Doren Custard of vein and vascular. He felt she had venous stasis ulcers. He felt based on her previous arteriogram she should have adequate circulation for healing. Also she has deep venous reflux but really no significant correctable superficial venous reflux at this time. He felt we should continue with conservative management including leg elevation and compression 04/02/2018; since we last saw this woman about a month ago she had a fall apparently suffered a pelvic fracture. I did not look up the x-ray. Nevertheless because of pain she literally was bedbound for 2 weeks and had home health coming out to change the dressing. Somewhat predictably this is resulted in considerable improvement in both wound areas. The right is just about closed on the medial malleolus and the left is about half the size. 04/16/2018; both her wounds continue to go down in size. Using Hydrofera Blue. 05/07/18; both her wounds appeared to be improving especially on the right where it is almost closed. We are using Hydrofera Blue 05/14/2018; slightly worse this week with larger wounds. Surface on the left medial not quite as good. We have been using Hydrofera Blue 05/21/18; again the wounds are slightly larger. Left  medial malleolus slightly larger with eschar around the circumference. We have been using Hydrofera Blue undergoing a wraps for a prolonged period of time. This got a lot better when she was more recumbent due to a fall and a back injury. I change the primary dressing the silver alginate today. She did not tolerate a 4 layer compression previously although I may need to bring this up with her next time 05/28/2018; area on the left medial malleolus again is slightly larger with more drainage. Area on the right is roughly unchanged. She has a small area of folliculitis on the right medial just on the lower calf. This does not look ominous. 06/03/2018 left medial malleolus slightly smaller in a better looking surface. We used silver nitrate on this last time with silver alginate. The area on the right appears slightly smaller 1/10; left medial malleolus slightly smaller. Small open area on the right. We used silver nitrate and silver alginate as of 2 weeks ago. We continue with the wound and compression. These got a lot better when she was off her feet 1/17; right medial malleolus wound is smaller. The left may be slightly smaller. Both surfaces look somewhat better. 1/24; both wounds are slightly smaller. Using silver alginate under Unna boots 1/31; both wounds appear smaller in fact the area on the right medial is just about closed. Surface eschar. We have been using silver alginate under Unna boots. The patient is less active now spends let much less time on her feet and I think this is contributed to the general improvement in the wound condition 2/7; both wounds appear smaller. I was hopeful the right medial would be closed however there there is still the same small open area. Slight amount of surface eschar on the left the dimensions are smaller there is eschar but the wound edges appear to be free. We have been using silver alginate under Unna boot's 2/14; both wounds once again measure smaller.  Circumferential eschar on the left medial. We have been using silver alginate under Unna boots with gradual improvement 2/21; the area on the right medial  malleolus has healed. The area on the left is smaller. We have been using silver alginate and Unna boots. We can discharge wrapping the right leg she has 20/30 stockings at home she will need to protect the scar tissue in this area 2/28; the area on the right medial malleolus remains closed the patient has a compression stocking. The area on the left is smaller. We have been using silver alginate and Unna boots. 3/6 the area on the right medial ankle remains closed. Good edema control noted she is using her own compression stocking. The area on the left medial ankle is smaller. We have been managing this with silver alginate and Unna boots which we will continue today. 3/13; the area on the right medial ankle remains closed and I'm declaring it healed today. When necessary the left is about the same still a healthy-looking surface but no major change and wound area. No evidence of infection and using silver alginate under unna and generally making considerable improvement 3/27 the area on the right medial ankle remains closed the area on the left is about the same as last week. Certainly not any worse we have been using silver alginate under an Unna boot 4/3; the area on the right medial ankle remains closed per the patient. We did not look at this wound. The wound on the left medial ankle is about the same surface looks healthy we have been using silver alginate under an Unna boot 4/10; area on the right medial ankle remains closed per the patient. We did not look at this wound. The wound on the left medial ankle is slightly larger. The patient complains that the [facility] caused burning pain all week. She also told us that she was a lot more active this week. Changed her back to silver alginate 4/17; right medial ankle still closed per the  patient. Left medial ankle is slightly larger. Using silver alginate. She did not tolerate Hydrofera Blue on this area 4/24; right medial ankle remains closed we have not look at this. The left medial ankle continues to get larger today by about a centimeter. We have been using silver alginate under Unna boots. She complains about 4 layer compression as an alternative. She has been up on her feet working on her garden 5/8; right medial ankle remains closed we did not look at this. The left medial ankle has increased in size about 100%. We have been using silver alginate under Unna boots. She noted increased pain this week and was not surprised that the wound is deteriorated 5/15; no major change in SA however much less erythema ( one week of doxy ocellulitis). 5/22-65 year old female returns at 1 week to the clinic for left medial ankle wound for which we have been using silver alginate under 3 layer compression She was placed on DOXY at last visit - the wound is wider at this visit. She is in 3 layer compression 5/29; change to [facility] last week. I had given her empiric doxycycline 2 weeks ago for a week. She is in 3 layer compression. She complains of a lot of pain and drainage on presentation today. 6/5; using Hydrofera Blue. I gave her doxycycline recently empirically for erythema and pain around the wound. Believe her cultures showed enterococcus which not would not have been well covered by doxycycline nevertheless the wound looks better and I don't feel specifically that the enterococcus needs to be covered. She has a new what looks like a wrap injury on  her lateral left ankle. 6/12; she is using Hydrofera Blue. She has a new area on the left anterior lower tibial area. This was a wrap injury last week. 6/19; the patient is using Hydrofera Blue. She arrived with marked inflammation and erythema around the wound and tenderness. 12/01/18 on evaluation today patient appears to be doing a  little bit better based on what I'm hearing from the standpoint of lassos evaluation to this as far as the overall appearance of the wound is concerned. Then sometime substandard she typically sees Dr. Dellia Nims. Nonetheless overall very pleased with the progress that she's made up to this point. No fevers, chills, nausea, or vomiting noted at this time. 7/10; some improvement in the surface area. Aggressively debrided last week apparently. I went ahead with the debridement today although the patient does not tolerate this very well. We have been using Iodoflex. Still a fair amount of drainage 7/17; slightly smaller. Using Iodoflex. 7/24; no change from last week in terms of surface area. We have been using Iodoflex. Surface looks and continues to look somewhat better 7/31; surface area slightly smaller better looking surface. We have been using Iodoflex. This is under Unna boot compression 8/7-Patient presents at 1 week with Unna boot and Iodoflex, wound appears better 8/14-Patient presents at 1 week with Iodoflex, we use the Unna boot, wound appears to be stable better.Patient is getting Botox treatment for the inversion of the foot for tendon release, Next week 8/21; we are using Iodoflex. Unna boot. The wound is stable in terms of surface area. Under illumination there is some areas of the wound that appear to be either epithelialized or perhaps this is adherent slough at this point I was not really clear. It did not wipe off and I was reluctant to debride this today. 8/28; we are using Iodoflex in an Unna boot. Seems to be making good improvement. 9/4; using Iodoflex and wound is slightly smaller. 9/18; we are using Iodoflex with topical silver nitrate when she is here. The wound continues to be smaller 10/2; patient missed her appointment last week due to GI issues. She left and Iodoflex based dressing on for 2 weeks. Wound is about the same size about the size of a dime on the left medial  lower 10/9 we have been using Iodoflex on the medial left ankle wound. She has a new superficial probable wrap injury on the dorsal left ankle 10/16; we have been using Hydrofera Blue since last week. This is on the left medial ankle 10/23; we have been using Hydrofera Blue since 2 weeks ago. This is on the left medial ankle. Dimensions are better 11/6; using Hydrofera Blue. I think the wound is smaller but still not closed. Left medial ankle 11/13; we have been using Hydrofera Blue. Wound is certainly no smaller this week. Also the surface not as good. This is the remanent of a very large area on her left medial ankle. 11/20; using Sorbact since last week. Wound was about the same in terms of size although I was disappointed about the surface debris 12/11; 3-week follow-up. Patient was on vacation. Wound is measuring slightly larger we have been using Sorbact. 12/18; wound is about the same size however surface looks better last week after debridement. We have been using Sorbact under compression 1/15 wound is probably twice the size of last time increased in length nonviable surface. We have been using Sorbact. She was running a mild fever and missed her appointment last week 1/22; the  wound is come down in size but under illumination still a very adherent debris we have been Hydrofera Blue that I changed her to last week 1/29; dimensions down slightly. We have been using Hydrofera Blue 2/19 dimensions are the same however there is rims of epithelialization under illumination. Therefore more the surface area may be epithelialized 2/26; the patient's wound actually measures smaller. The wound looks healthy. We have been using Hydrofera Blue. I had some thoughts about running Apligraf then I still may do that however this looks so much better this week we will delay that for now 3/5; the wound is small but about the same as last week. We have been using Hydrofera Blue. No debridement is required  today. 3/19; the wound is about the size of a dime. Healthy looking wound even under illumination. We have been using Hydrofera Blue. No mechanical debridement is necessary 3/26; not much change from last week although still looks very healthy. We have been using Hydrofera Blue under Unna boots Patient was offered an ankle fusion by podiatry but not until the wound heals with a proceed with this. 4/9; the patient comes in today with her original wound on the medial ankle looking satisfactory however she has some uncontrolled swelling in the middle part of her leg with 2 new open areas superiorly just lateral to the tibia. I think this was probably a wrap issue. She said she felt uncomfortable during the week but did not call in. We have been using Hydrofera Blue 4/16; the wound on the medial ankle is about the same. She has innumerable small areas superior to this across her mid tibia. I think this is probably folliculitis. She is also been working in the yard doing a lot of sweating 4/30; the patient issue on the upper areas across her mid tibia of all healed. I think this was excessive yard work if I remember. Her wound on the medial ankle is smaller. Some debris on this we have been using Hydrofera Blue under Unna boots 5/7; mid tibia. She has been using Hydrofera Blue under an Unna wrap. She is apparently going for her ankle surgery on June 3 10/28/19-Patient returns to clinic with the ankle wound, we are using Hydrofera Blue under Unna wrap, surgery is scheduled for her left foot for June 3 so she will be back for nurse visit next week READMISSION 01/17/2020 Mrs. Zika is a 65 year old woman we have had in this clinic for a long period of time with severe venous hypertension and refractory wounds on her medial lower legs and ankles bilaterally. 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 When she was off her legs for a prolonged period example when she fell and suffered a compression fracture things would heal up quite nicely. She is now retired and we managed to heal up the right medial leg wound. The left one was very tiny last time I saw this although still refractory. 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 It got to the point where this was interfering with her walking and she elected to proceed with a ankle arthrodesis to straighten her her ankle and leave her with a functional outcome for mobilization. The patient was referred to Dr. Doren Custard and really this took some time to arrange. Dr.  Dixon saw her on 12/07/2019. Once again he verified that she had no arterial issues. She had previously had an angiogram several years ago. Follow-up ABIs on the left showed an ABI of 1.12 with triphasic waveforms and a TBI of 0.92. She is felt to have chronic deep venous insufficiency but I do not think it was felt that anything could be done from about this from an ablation point of view. At the time Dr. Doren Custard saw this patient the wounds actually look closed via the pictures in his clinic. The patient finally underwent her surgery on 12/15/2019. This went reasonably well and there was a good anatomic outcome. She developed a small distal wound dehiscence on the lateral part of the surgical wound. However more problematically she is developed recurrence of the wound on the medial left ankle. There are actually 2 wounds here one in the distal lower leg and 1 pretty much at the level of the medial malleolus. It is a more distal area that is more problematic. She has been using Hydrofera Blue which started on Friday before this she was simply Ace wrapping. There was a culture done that showed Pseudomonas and she is on ciprofloxacin. A recent CNS on 8/11 was negative. The patient reports some  pain but I generally think this is improving. She is using a cam boot completely nonweightbearing using a walker for pivot transfers and a wheelchair 8/24; not much improvement unfortunately she has a surgical wound on the lateral part in the venous insufficiency wound medially. The bottom part of the medial insufficiency wound is still necrotic there is exposed tendon here. We have been using Hydrofera Blue under compression. Her edema control is however better 8/31; patient in for follow-up of his surgical wound on the lateral part of her left leg and chronic venous insufficiency ulcers medially. We put her back in compression last week. She comes in today with a complaint of 3 or 4 days worth of increasing pain. She felt her cam walker was rubbing on the area on the back of her heel. However there is intense erythema seems more likely she has cellulitis. She had 2 cultures done when she was seeing podiatry in the postop. One of them in late July showed Pseudomonas and she received a course of ciprofloxacin the other was negative on 8/11 she is allergic to penicillin with anaphylactoid complaints of hives oral swelling via information in epic 9/9; when I saw this patient last week she had intense anterior erythema around her wound on the right lateral heel and ankle and also into the right medial heel. Some of this was no doubt drainage and her walker boot however I was convinced she had cellulitis. I gave her Levaquin and Bactrim she is finishing up on this now. She is following up with Dr. [name] he saw her yesterday. He is taken her out of the walking boot of course she is still nonweightbearing. Her x-ray was negative for any worrisome features such as soft tissue air etc. Things are a lot better this week. She has home health. We have been using Hydrofera Blue under an [employer] which she put back on yesterday. I did not wrap her last week 9/17; her surrounding skin looks a lot better. In fact  the area on the left lateral ankle has just a scant amount of eschar. The only remaining wound is the large area on the left medial ankle. Probably about 60% of this is healthy granulation at the surface however  she has a significant divot distally. This has adherent debris in it. I been using debridement and silver collagen to try and get this area to fill-in although I do not think we have made much progress this week 9/24; the patient's wound on the left medial ankle looks a lot better. The deeper divot area distally still requires debridement but this is cleaning up quite nicely we have been using silver collagen. The patient is complaining of swelling in her foot and is worried that that is contributing to the nonhealing of the ankle wound. She is also complaining of numbness in her anterior toes 10/4; left medial ankle. The small area distally still has a divot with necrotic material that I have been debriding away. This has an undermining area. She is approved for Apligraf. She saw Dr. [name] her surgeon on 10/1. I think he declared himself is satisfied with the condition of things. Still nonweightbearing till the end of the month. 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 Her surgical wound is well closed. There is no evidence of infection 10/11; the patient arrived in clinic today with the expectation that we be able to put an Apligraf on this area after debridement however she arrives with a relatively large amount of green drainage on the dressing. The patient states that this started on Friday. She has not been systemically unwell. 10/19; culture I did last week showed both Enterococcus and Pseudomonas. I think this came in separate parts because I stopped her ciprofloxacin I gave her and prescribed her linezolid however now looking at the final culture result this is Pseudomonas which is resistant to quinolones. She has not yet picked up the linezolid  apparently phone issues. We are also trying to get a topical antibiotic out of [facility] in [place] they can be applied by home health. She is still having green drainage 10/16; the patient has her topical antibiotic from [facility] in [place]. This is a compounded gel with vancomycin and ciprofloxacin and gentamicin. We are applying this on the wound bed with silver alginate over the top with Unna boot wraps. She arrives in clinic today with a lot less ominous looking drainage although she is only use this topical preparation once the second time today. She sees Dr. [name] her surgeon on Friday she has home health changing the dressing 11/2; still using her compounded topical antibiotic under silver alginate. Surface is cleaning up there is less drainage. We had an Apligraf for her today and I elected to apply it. A light coating of her antibiotic 04/25/2020 upon evaluation today patient appears to be doing well with regard to her ankle ulcer. There is a little bit of slough noted on the surface of the wound I am can have to perform sharp debridement to clear this away today. With that being said other than that fact overall I feel like she is making progress and we do see some new epithelial growth. There is also some improvement in the depth of the wound and that distal portion. There is little bit of slough there as well. 12/7; 2-week follow-up. Apligraf #3. Dimensions are smaller. Closing in especially inferiorly. Still some surface debris. Still using the [facility] topical antibiotic but I told her that I don't think this needs to be renewed 12/21; 2-week follow-up. Apligraf #4 dimensions are smaller. Nice improvement 06/05/2020; 2-week follow-up. The patient's wound on the left medial ankle looks really excellent. Nice granulation. Advancing epithelialization no undermining  no evidence of infection. We would have to reapply for another Apligraf but with the condition of this wound I  did not feel strongly about it. We used Hydrofera Blue under the same degree of compression. She follows up with Dr. [name] her surgeon a week Friday 06/13/2020 upon evaluation today patient appears to be doing excellent in regard to her wound. She has been tolerating the dressing changes without complication. Fortunately there is no signs of active infection at this time. No fevers, chills, nausea, vomiting, or diarrhea. She was using Hydrofera Blue last week. 06/20/2020 06/20/2020 on evaluation today patient actually appears to be doing excellent in regard to her wound. This is measuring better and looking much better as well. She has been using the collagen that seems to be doing better for her as well even though the [facility] was and is not sticking or feeling as rough on her wound. She did see Dr. [name] on Friday he is very pleased he also stated none of the hardware has shifted. That is great news Objective Constitutional Well-nourished and well-hydrated in no acute distress. Vitals Time Taken: 12:57 PM, Height: 68 in, Temperature: 98.3 F, Pulse: 62 bpm, Respiratory Rate: 17 breaths/min, Blood Pressure: 113/68 mmHg. Respiratory normal breathing without difficulty. Psychiatric this patient is able to make decisions and demonstrates good insight into disease process. Alert and Oriented x 3. pleasant and cooperative. General Notes: On inspection patient's wound bed did not require any sharp debridement today I did remove some of the dry skin around the edges of the wound but again nothing that in the central part of the wound was even necessary. I do think the collagen is doing a great job and overall her measurements are significantly improved. Integumentary (Hair, Skin) Wound #15 status is Open. Original cause of wound was Gradually Appeared. The wound is located on the Left,Medial Malleolus. The wound measures 1.5cm length x 0.7cm width x 0.1cm depth; 0.825cm^2 area and 0.082cm^3  volume. There is Fat Layer (Subcutaneous Tissue) exposed. There is no tunneling or undermining noted. There is a medium amount of serosanguineous drainage noted. The wound margin is flat and intact. There is large (67-100%) red granulation within the wound bed. There is no necrotic tissue within the wound bed. Assessment Active Problems ICD-10 Chronic venous hypertension (idiopathic) with ulcer and inflammation of left lower extremity Non-pressure chronic ulcer of other part of left lower leg with other specified severity Non-pressure chronic ulcer of left ankle with other specified severity Procedures Wound #15 Pre-procedure diagnosis of Wound #15 is a Venous Leg Ulcer located on the Left,Medial Malleolus . There was a Haematologist Compression Therapy Procedure by [name], RN. Post procedure Diagnosis Wound #15: Same as Pre-Procedure Plan Follow-up Appointments: Return Appointment in 1 week. - Tues. with Dr. Arcola Jansky Shower/ Hygiene: May shower with protection but do not get wound dressing(s) wet. Edema Control - Lymphedema / SCD / Other: Elevate legs to the level of the heart or above for 30 minutes daily and/or when sitting, a frequency of: Avoid standing for long periods of time. Exercise regularly Compression stocking or Garment 20-30 mm/Hg pressure to: - right leg daily WOUND #15: - Malleolus Wound Laterality: Left, Medial Cleanser: Soap and Water 1 x Per Week/15 Days Discharge Instructions: May shower and wash wound with dial antibacterial soap and water prior to dressing change. Peri-Wound Care: Triamcinolone 15 (g) 1 x Per Week/15 Days Discharge Instructions: Use triamcinolone 15 (g) as directed Peri-Wound Care: Sween Lotion (Moisturizing  lotion) 1 x Per Week/15 Days Discharge Instructions: Apply moisturizing lotion as directed Prim Dressing: Promogran Prisma Matrix, 4.34 (sq in) (silver collagen) 1 x Per Week/15 Days ary Discharge Instructions: Moisten  collagen with saline or hydrogel Secondary Dressing: Woven Gauze Sponge, Non-Sterile 4x4 in (Home Health) 1 x Per Week/15 Days Discharge Instructions: Apply over primary dressing as directed. Secondary Dressing: ABD Pad, 5x9 (Home Health) 1 x Per Week/15 Days Discharge Instructions: Apply over primary dressing as directed. Com pression Wrap: Unnaboot w/Calamine, 4x10 (in/yd) (Home Health) 1 x Per Week/15 Days Discharge Instructions: Apply Unnaboot as directed. 1. Would recommend currently that we go ahead and continue with the collagen. We do have the approval for the Apligraf but I am not even certain that is necessary at this point. 2. I am also going to suggest that we have the patient continue with the Unna boot wrap that seems to have done well for her. We will see patient back for reevaluation in 1 week here in the clinic. If anything worsens or changes patient will contact our office for additional recommendations. Electronic Signature(s) Signed: 06/20/2020 1:47:43 PM By: [name] PA-C Entered By: [name] on 06/20/2020 13:47:43 -------------------------------------------------------------------------------- SuperBill Details Patient Name: Date of Service: [name], [name] 06/20/2020 Medical Record Number: [id number] Patient Account Number: [id number] Date of Birth/Sex: Treating RN: Sep 30, 1955 (65 y.o. [name] Provider: [name] Other Clinician: Referring Provider: Treating Provider/Extender: [name] in Treatment: 22 Diagnosis Coding ICD-10 Codes Code Description [phone] Chronic venous hypertension (idiopathic) with ulcer and inflammation of left lower extremity L97.828 Non-pressure chronic ulcer of other part of left lower leg with other specified severity L97.328 Non-pressure chronic ulcer of left ankle with other specified severity Facility Procedures CPT4 Code: [id number] Description: (Facility Use  Only) [id number] - APPLY UNNA BOOT LT Modifier: Quantity: 1 Physician Procedures : CPT4 Code Description Modifier [id number] - WC PHYS LEVEL 3 - EST PT ICD-10 Diagnosis Description I87.332 Chronic venous hypertension (idiopathic) with ulcer and inflammation of left lower extremity L97.828 Non-pressure chronic ulcer of other part  of left lower leg with other specified severity L97.328 Non-pressure chronic ulcer of left ankle with other specified severity Quantity: 1 Electronic Signature(s) Signed: 06/20/2020 1:48:04 PM By: [name] PA-C Entered By: [name] on 06/20/2020 13:48:03

## 2020-06-27 ENCOUNTER — Telehealth: Payer: Self-pay | Admitting: *Deleted

## 2020-06-27 NOTE — Telephone Encounter (Signed)
Patient is calling to request Pain medication(Oxycodone-ace,5-'325mg'$ ). Please advise.

## 2020-06-28 ENCOUNTER — Other Ambulatory Visit: Payer: Self-pay | Admitting: *Deleted

## 2020-06-28 ENCOUNTER — Other Ambulatory Visit: Payer: Self-pay

## 2020-06-28 ENCOUNTER — Other Ambulatory Visit: Payer: Self-pay | Admitting: Podiatry

## 2020-06-28 ENCOUNTER — Encounter (HOSPITAL_BASED_OUTPATIENT_CLINIC_OR_DEPARTMENT_OTHER): Payer: Medicare Other | Admitting: Internal Medicine

## 2020-06-28 ENCOUNTER — Telehealth: Payer: Self-pay | Admitting: *Deleted

## 2020-06-28 DIAGNOSIS — I87332 Chronic venous hypertension (idiopathic) with ulcer and inflammation of left lower extremity: Secondary | ICD-10-CM | POA: Diagnosis not present

## 2020-06-28 MED ORDER — OXYCODONE-ACETAMINOPHEN 5-325 MG PO TABS: 1.0000 | ORAL_TABLET | Freq: Three times a day (TID) | ORAL | 0 refills | Status: DC | PRN

## 2020-06-28 NOTE — Telephone Encounter (Signed)
[name] is calling on the status of an outstanding add on discipline order for [id number] that has been faxed 3 times. Please contact at [phone]) back to her office.

## 2020-06-28 NOTE — Progress Notes (Signed)
CHARLESE, ASHBURN ([id number]) Visit Report for 06/28/2020 HPI Details Patient Name: Date of Service: [facility] [name], [name] 06/28/2020 9:45 A M Medical Record Number: [id number] Patient Account Number: [id number] Date of Birth/Sex: Treating RN: Jul 07, 1955 (65 y.o. [name] Primary Care Provider: [name] Other Clinician: Referring Provider: Treating Provider/Extender: [name] in Treatment: 23 History of Present Illness HPI Description: the remaining wound is over the left medial ankle. Similar wound over the right medial ankle healed largely with use of Apligraf. Most recently we have been using Hydrofera Blue over this wound with considerable improvement. The patient has been extensively worked up in the past for her venous insufficiency and she is not a candidate for antireflux surgery although I have none of the details available currently. 08/24/14; considerable improvement today. About 50% of this wound areas now epithelialized. The base of the wound appears to be healthier granulation.as opposed to last week when she had deteriorated a considerable improvement 08/17/14; unfortunately the wound has regressed somewhat. The areas of epithelialization from the superior aspect are not nearly as healthy as they were last week. The patient thinks her Hydrofera Blue slipped. 09/07/14; unfortunately the area has markedly regressed in the 2 weeks since I've seen this. There is an odor surrounding erythema. The healthy granulation tissue that we had at the base of the wound now is a dusky color. The nurse reports green drainage 09/14/14; the area looks somewhat better than last week. There is less erythema and less drainage. The culture I did did not show any growth. Nevertheless I think it is better to continue the Cipro and doxycycline for a further week. The remaining wound area was debridement. 09/21/14. Wound did not require debridement last week. Still less  erythema and less drainage. She can complete her antibiotics. The areas of epithelialization in the superior aspect of the wound do not look as healthy as they did some weeks ago 10/05/14 continued improvement in the condition of this wound. There is advancing epithelialization. Less aggressive debridement required 10/19/14 continued improvement in the condition and volume of this wound. Less aggressive debridement to the inferior part of this to remove surface slough and fibrinous eschar 11/02/14 no debridement is required. The surface granulation appears healthy although some of her islands of epithelialization seem to have regressed. No evidence of infection 11/16/14; lites surface debridement done of surface eschar. The wound does not look to be unhealthy. No evidence of infection. Unfortunately the patient has had podiatry issues in the right foot and for some reason has redeveloped small surface ulcerations in the medial right ankle. Her original presentation involved wounds in this area 11/23/14 no debridement. The area on the right ankle has enlarged. The left ankle wound appears stable in terms of the surface although there is periwound inflammation. There has been regression in the amount of new skin 11/30/14 no debridement. Both wound areas appear healthy. There was no evidence of infection. The the new area on the right medial ankle has enlarged although that both the surfaces appear to be stable. 12/07/14; Debridement of the right medial ankle wound. No no debridement was done on the left. 12/14/14 no major change in and now bilateral medial ankle wounds. Both of these are very painful but the no overt evidence of infection. She has had previous venous ablation 12/21/14; patient states that her right medial ankle wound is considerably more painful last week than usual. Her left is also somewhat painful. She could not  tolerate debridement. The right medial ankle wound has fibrinous surface  eschar 12/28/14 this is a patient with severe bilateral venous insufficiency ulcers. For a considerable period of time we actually had the one on the right medial ankle healed however this recently opened up again in June. The left medial ankle wound has been a refractory area with some absent flows. We had some success with Hydrofera Blue on this area and it literally closed by 50% however it is recently opened up Foley. Both of these were debridement today of surface eschar. She tolerates this poorly 01/25/15: No change in the status of this. Thick adherent escar. Very poor tolerance of any attempt at debridement. I had healed the right medial malleolus wound for a considerable amount of time and had the left one down to about 50% of the volume although this is totally regressed over the last 48 weeks. Further the right leg has reopened. she is trying to make a appointment with pain and vascular, previous ablations with Dr. Aleda Grana. I do not believe there is an arterial insufficiency issue here 02/01/15 the status of the adherent eschar bilaterally is actually improved. No debridement was done. She did not manage to get vascular studies done 02/08/15 continued debridement of the area was done today. The slough is less adherent and comes off with less pressure. There is no surrounding infection peripheral pulses are intact 02/15/15 selective debridement with a disposable curette. Again the slough is less adherent and comes off with less difficulty. No surrounding infection peripheral pulses are intact. 02/22/15 selective debridement of the right medial ankle wound. Slough comes off with less difficulty. No obvious surrounding infection peripheral pulses are intact I did not debridement the one on the left. Both of these are stable to improved 03/01/15 selective debridement of both wound areas using a curette to. Adherent slough cup soft with less difficulty. No obvious surrounding infection.  The patient tells me that 2 days ago she noted a rash above the right leg wrap. She did not have this on her lower legs when she change this over she arrives with widespread left greater than right almost folliculitis-looking rash which is extremely pruritic. I don't see anything to culture here. There is no rash on the rest of her body. She feels well systemically. 03/08/15; selective debridement of both wounds using a curette. Base of this does not look unhealthy. She had limegreen drainage coming out of the left leg wound and describes a lot of drainage. The rash on her left leg looks improved to. No cultures were done. 03/22/15; patient was not here last week. Basal wounds does not look healthy and there is no surrounding erythema. No drainage. There is still a rash on the left leg that almost looks vasculitic however it is clearly limited to the top of where the wrap would be. 04/05/15; on the right required a surgical debridement of surface eschar and necrotic subcutaneous tissue. I did not debridement the area on the left. These continue to be large open wounds that are not changing that much. We were successful at one point in healing the area on the right, and at the same time the area on the left was roughly half the size of current measurements. I think a lot of the deterioration has to do with the prolonged time the patient is on her feet at work 04/19/15 I attempted-like surface debridement bilaterally she does not tolerate this. She tells me that she was in allergic care yesterday with  extreme pain over her left lateral malleolus/ankle and was told that she has an "sprain" 05/03/15; large bilateral venous insufficiency wounds over the medial malleolus/medial aspect of her ankles. She complains of copious amounts of drainage and his usual large amounts of pain. There is some increasing erythema around the wound on the right extending into the medial aspect of her foot to. historically she  came in with these wounds the right one healed and the left one came down to roughly half its current size however the right one is reopened and the left is expanded. 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 05/10/15 large bilateral venous insufficiency wounds. There is less adherence surface left however the surface culture that I did last week grew pseudomonas therefore bilateral selective debridement score necessary. There is surrounding erythema. The patient describes severe bilateral drainage and a lot of pain in the left ankle. Apparently her podiatrist was were ready to do a cortisone shot 05/17/15; the patient complains of pain and again copious amounts of drainage. 05/24/15; we used Iodo flex last week. Patient notes considerable improvement in wound drainage. Only needed to change this once. 05/31/15; we continued Iodoflex; the base of these large wounds bilaterally is not too bad but there is probably likely a significant bioburden here. I would like to debridement just doesn't tolerate it. 06/06/14 I would like to continue the Iodoflex although she still hasn't managed to obtain supplies. She has bilateral medial malleoli or large wounds which are mostly superficial. Both of them are covered circumferentially with some nonviable fibrinous slough although she tolerates debridement very poorly. She apparently has an appointment for an ablation on the right leg by interventional radiology. 06/14/15; the patient arrives with the wounds and static condition. We attempted a debridement although she does not do well with this secondary to pain. I 07/05/15; wounds are not much smaller however there appears to be a cleaner granulating base. The left has tight fibrinous slough greater than the right. Debridement is tolerated poorly due to pain. Iodoflex is done more for these wounds in any of the multitude of different dressings I have tried on the left  1 and then subsequently the right. 07/12/15; no change in the condition of this wound. I am able to do an aggressive debridement on the right but not the left. She simply cannot tolerate it. We have been using Iodoflex which helps somewhat. It is worthwhile remembering that at one point we healed the right medial ankle wound and the left was about 25% of the current circumference. We have suggested returning to vascular surgery for review of possible further ablations for one reason or another she has not been able to do this. 07/26/15 no major change in the condition of either wound on her medial ankle. I did not attempt to debridement of these. She has been aggressively scrubbing these while she is in the shower at home. She has her supply of Iodoflex which seems to have done more for these wounds then anything I have put on recently. 08/09/15 wound area appears larger although not verified by measurements. Using Iodoflex 09/05/2015 -- she was here for avisit today but had significant problems with the wound and I was asked to see her for a physician opinion. I have summarize that this lady has had surgery on her left lower extremity about 10 years ago where the possible veins stripping was done. She has  had an opinion from interventional radiology around November 2016 where no further sclerotherapy was ordered. The patient works 12 hours a day and stands on a concrete floor with work boots and is unable to get the proper compression she requires and cannot elevate her limbs appropriately at any given time. She has recently grown Pseudomonas from her wound culture but has not started her ciprofloxacin which was called in for her. 09/13/15 this continues to be a difficult situation for this patient. At one point I had this wound down to a 1.5 x 1.5" wound on her left leg. This is deteriorated and the right leg has reopened. She now has substantial wounds on her medial calcaneus, malleoli and into her  lower leg. One on the left has surface eschar but these are far too painful for me to debridement here. She has a vascular surgery appointment next week to see if anything can be done to help here. I think she has had previous ablations several years ago at [facility]. She has no major edema. She tells me that she did not get product last time [facility] and went for several days without it. She continues to work in work boots 12 hours a day. She cannot get compression/4-layer under her work boots. 09/20/15 no major change. Periwound edema control was not very good. Her point with pain and vascular is next Wednesday the 25th 09/28/15; the patient is seen vascular surgery and is apparently scheduled for repeat duplex ultrasounds of her bilateral lower legs next week. 10/05/15; the patient was seen by Dr. Doren Custard of vascular surgery. He feels that she should have arterial insufficiency excluded as cause/contributed to her nonhealing stage she is therefore booked for an arteriogram. She has apparently monophasic signals in the dorsalis pedis pulses. She also of course has known severe chronic venous insufficiency with previous procedures as noted previously. I had another long discussion with the patient today about her continuing to work 12 hour shifts. I've written her out for 2 months area had concerns about this as her work location is currently undergoing significant turmoil and this may lead to her termination. She is aware of this however I agree with her that she simply cannot continue to stand for 12 hours multiple days a week with the substantial wound areas she has. 10/19/15; the Dr. Doren Custard appointment was largely for an arteriogram which was normal. She does not have an arterial issue. He didn't make a comment about her chronic venous insufficiency for which she has had previous ablations. Presumably it was not felt that anything additional could be done. The patient is now out of work as I  prescribed 2 weeks ago. Her wounds look somewhat less aggravated presumably because of this. I felt I would give debridement another try today 10/25/15; no major change in this patient's wounds. We are struggling to get her product that she can afford into her own home through her insurance. 11/01/15; no major change in the patient's wounds. I have been using silver alginate as the most affordable product. I spoke to Dr. Marla Roe last week with her requested take her to the OR for surgical debridement and placement of ACEL. Dr. Marla Roe told me that she would be willing to do this however [facility] will not cover this, fortunately the patient has [place] healthcare of some variant 11/08/15; no major change in the patient's wounds. She has been completely nonviable surface that this but is in too much pain with any  attempted debridement are clinic. I have arranged for her to see Dr. Marla Roe ham of plastic surgery and this appointment is on Monday. I am hopeful that they will take her to the OR for debridement, possible ACEL ultimately possible skin graft 11/22/15 no major change in the patient's wounds over her bilateral medial calcaneus medial malleolus into the lower legs. Surface on these does not look too bad however on the left there is surrounding erythema and tenderness. This may be cellulitis or could him sleepy tinea. 11/29/15; no major changes in the patient's wounds over her bilateral medial malleolus. There is no infection here and I don't think any additional antibiotics are necessary. There is now plan to move forward. She sees Dr. Marla Roe in a week's time for preparation for operative debridement and ACEL placement I believe on 7/12. She then has a follow-up appointment with Dr. Marla Roe on 7/21 12/28/15; the patient returns today having been taken to the [facility] by Dr. Marla Roe 12/12/15 she underwent debridement, intraoperative cultures [which were negative]. She had  placement of a wound VAC. Parent really ACEL was not available to be placed. The wound VAC foam apparently adhered to the wound since then she's been using silver alginate, Xeroform under Ace wraps. She still says there is a lot of drainage and a lot of pain 01/31/16; this is a patient I see monthly. I had referred her to Dr. Marla Roe him of plastic surgery for large wounds on her bilateral medial ankles. She has been to the OR twice once in early July and once in early August. She tells me over the last 3 weeks she has been using the wound VAC with ACEL underneath it. On the right we've simply been using silver alginate. Under Kerlix Coban wraps. 02/28/16; this is a patient I'm currently seeing monthly. She is gone on to have a skin graft over her large venous insufficiency ulcer on the left medial ankle. This was done by Dr. Marla Roe him. The patient is a bit perturbed about why she didn't have one on her right medial ankle wound. She has been using silver alginate to this. 03/06/16; I received a phone call from her plastic surgery Dr. Marla Roe. She expressed some concern about the viability of the skin graft she did on the left medial ankle wound. Asked me to place Endoform on this. She told me she is not planning to do a subsequent skin graft on the right as the left one did not take very well. I had placed Hydrofera Blue on the right 03/13/16; continue to have a reasonably healthy wound on the right medial ankle. Down to 3 mm in terms of size. There is epithelialization here. The area on the left medial ankle is her skin graft site. I suppose the last week this looks somewhat better. She has an open area inferiorly however in the center there appears to be some viable tissue. There is a lot of surface callus and eschar that will eventually need to come off however none of this looked to be infected. Patient states that the is able to keep the dressing on for several days which is an  improvement. 03/20/16 no major change in the circumference of either wound however on the left side the patient was at Dr. Eusebio Friendly office and they did a debridement of left wound. 50% of the wound seems to be epithelialized. I been using Endoform on the left Hydrofera Blue in the right 03/27/16; she arrives today with her wound is not  looking as healthy as they did last week. The area on the right clearly has an adherent surface to this a very similar surface on the left. Unfortunately for this patient this is all too familiar problem. Clearly the Endoform is not working and will need to change that today that has some potential to help this surface. She does not tolerate debridement in this clinic very well. She is changing the dressing wants 04/03/16; patient arrives with the wounds looking somewhat better especially on the right. Dr. Migdalia Dk change the dressing to silver alginate when she saw her on Monday and also sold her some compression socks. The usefulness of the latter is really not clear and woman with severely draining wounds. 04/10/16; the patient is doing a bit of an experiment wearing the compression stockings that Dr. Migdalia Dk provided her to her left leg and the out of legs based dressings that we provided to the right. 05/01/16; the patient is continuing to wear compression stockings Dr. Migdalia Dk provided her on the left that are apparently silver impregnated. She has been using Iodoflex to the right leg wound. Still a moderate amount of drainage, when she leaves here the wraps only last for 4 days. She has to change the stocking on the left leg every night 05/15/16; she is now using compression stockings bilaterally provided by Dr. Marla Roe. She is wearing a nonadherent layer over the wounds so really I don't think there is anything specific being done to this now. She has some reduction on the left wound. The right is stable. I think all healing here is being done without a  specific dressing 06/09/16; patient arrives here today with not much change in the wound certainly in diameter to large circular wounds over the medial aspect of her ankle bilaterally. Under the light of these services are certainly not viable for healing. There is no evidence of surrounding infection. She is wearing compression stockings with some sort of silver impregnation as prescribed by Dr. Marla Roe. She has a follow-up with her tomorrow. 06/30/16; no major change in the size or condition of her wounds. These are still probably covered with a nonviable surface. She is using only her purchase stockings. She did see Dr. Marla Roe who seemed to want to apply Dakin's solution to this I'm not extreme short what value this would be. I would suggest Iodoflex which she still has at home. 07/28/16; I follow Mrs. [place] episodically along with Dr. Marla Roe. 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 She has been applying a topical collagen ointment to both wounds with Adaptic. I don't think Dr. Marla Roe is planning to take her back to the OR. 08/19/16; I follow Mrs. Jeneen Rinks on a monthly basis along with Dr. Marla Roe of plastic surgery. 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 I been following her for a number of years. At one point I was able to get the right medial malleolus wound to heal and had the left medial malleolus down to about half its current size however and I had to send her to plastic surgery for an operative debridement. Since then things have been stable to slightly improve the area on the right is slightly better one in the left about the same although there is much less adherent surface than I'm used to with this patient. She is using some form of liquid collagen gel that Dr. Marla Roe provided a Kerlix  cover with the patient's own pressure stockings.  She tells me that she has extreme pain in both ankles and along the lateral aspect of both feet. She has been unable to work for some period of time. She is telling me she is retiring at the beginning of April. She sees Dr. Doran Durand of orthopedics next week 09/22/16; patient has not seen Dr. Marla Roe since the last time she is here. I'm not really sure what she is using to the wounds other than bits and pieces of think she had left over including most recently Hydrofera Blue. She is using juxtalite stockings. She is having difficulty with her husband's recent illness "stroke". She is having to transport him to various doctors appointments. Dr. Marla Roe left her the option of a repeat debridement with ACEL however she has not been able to get the time to follow-up on this. She continues to have a fair amount of drainage out of these wounds with certainly precludes leaving dressings on all week 10/13/16; patient has not seen Dr. Marla Roe since she was last in our clinic. I'm not really sure what she is doing with the wounds, we did try to get her [facility] and I think she is actually using this most of the time. Because of drainage she states she has to change this every second day although this is an improvement from what she used to do. She went to see Dr. Doran Durand who did not think she had a muscular issue with regards to her feet, he referred her to a neurologist and I think the appointment is sometime in June. I changed her back to Iodoflex which she has used in the past but not recently. 11/03/16; the patient has been using Iodoflex although she ran out of this. Still claims that there is a lot of drainage although the wound does not look like this. No surrounding erythema. She has not been back to see Dr. Marla Roe 11/24/16; the patient has been using Iodoflex again but she ran out of it 2 or 3 days ago. There is no major change in the condition of either one of these wounds in fact they are  larger and covered in a thick adherent surface slough/nonviable tissue especially on the left. She does not tolerate mechanical debridement in our clinic. Going back to see Dr. Marla Roe of plastic surgery for an operative debridement would seem reasonable. 12/15/16; the patient has not been back to see Dr. Marla Roe. She is been dealing with a series of illnesses and her husband which of monopolized her time. She is been using Sorbact which we largely supplied. She states the drainage is bad enough that it maximum she can go 2-3 days without changing the dressing 01/12/2017 -- the patient has not been back for about 4 weeks and has not seen Dr. Marla Roe not does she have any appointment pending. 01/23/17; patient has not seen Dr. Marla Roe even though I suggested this previously. She is using Santyl that was suggested last week by Dr. Con Memos this Cost her $16 through her insurance which is indeed surprising 02/12/17; continuing Santyl and the patient is changing this daily. A lot of drainage. She has not been back to see plastic surgery she is using an Ace wrap. Our intake nurse suggested wrap around stockings which would make a good reasonable alternative 02/26/17; patient is been using Santyl and changing this daily due to drainage. She has not been to see plastic surgery she uses in April Ace wrap to control the  edema. She did obtain extremitease stockings but stated that the edema in her leg was to big for these 03/20/17; patient is using Santyl and Anasept. Surfaces looked better today the area on the right is actually measuring a little smaller. She has states she has a lot of pain in her feet and ankles and is asking for a consult to pain control which I'll try to help her with through our case manager. 04/10/17; the patient arrives with better-looking wound surfaces and is slightly smaller wound on the left she is using a combination of Santyl and Anasept. She has an appointment or at least as  started in the pain control center associated with [facility] 05/14/17; this is a patient who I followed for a prolonged period of time. 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 At one point I had this down to a much smaller wound on the left however these reopened and we've never been able to get these to heal. She has been using Santyl and Anasept gel although 2 weeks ago she ran out of the Anasept gel. She has a stable appearance of the wound. She is going to the wound care clinic at [facility]. They wanted do a nerve block/spinal block although she tells me she is reluctant to go forward with that. 05/21/17; this is a patient I have followed for many years. 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 Chronic pain and deformity in her ankles as well. She is been to see plastic surgery as well as orthopedics. Using PolyMem AG most recently/Kerramax/ABDs and 2 layer compression. She has managed to keep this on and she is coming in for a nurse check to change the dressing on Tuesdays, we see her on Fridays 06/05/17; really quite a good looking surface and the area especially on the right medial has contracted in terms of dimensions. Well granulated healthy-looking tissue on both sides. Even with an open curet there is nothing that even feels abnormal here. This is as good as I've seen this in quite some time. We have been using PolyMem AG and bringing her in for a nurse check 06/12/17; really quite good surface on both of these wounds. The right medial has contracted a bit left is not. We've been using PolyMem and AG and she is coming in for a nurse visit 06/19/17; we have been using PolyMem AG and bringing her in for a nurse check. Dimensions of her wounds are not better but the surfaces looked better bilaterally. She complained of bleeding last night and the left wound and increasing pain bilaterally. She states her wound pain is more  neuropathic than just the wounds. There was some suggestion that this was radicular from her pain management doctor in talking to her it is really difficult to sort this out. 06/26/17; using PolyMem and AG and bringing her in for a nurse check as All of this and reasonably stable condition. Certainly not improved. The dimensions on the lateral part of the right leg look better but not really measuring better. The medial aspect on the left is about the same. 07/03/16; we have been using PolyMen AG and bringing her in for a nurse check to change the dressings as the wounds have drainage which precludes once weekly changing. We are using all secondary absorptive dressings.our intake nurse is brought up the idea of using a wound VAC/snap VAC on the wound to help with the drainage to see if this would  result in some contraction. This is not a bad idea. The area on the right medial is actually looking smaller. Both wounds have a reasonable-looking surface. There is no evidence of cellulitis. The edema is well controlled 07/10/17; the patient was denied for a snap VAC by her insurance. The major issue with these wounds continues to be drainage. We are using wicked PolyMem AG and she is coming in for a nurse visit to change this. The wounds are stable to slightly improved. The surface looks vibrant and the area on the right certainly has shrunk in size but very slowly 07/17/17; the patient still has large wounds on her bilateral medial malleoli. Surface of both of these wounds looks better. The dimensions seem to come and go but no consistent improvement. There is no epithelialization. We do not have options for advanced treatment products due to insurance issues. They did not approve of the wound VAC to help control the drainage. More recently we've been using PolyMem and AG wicked to allow drainage through. We have been bringing her in for a nurse visit to change this. We do not have a lot of options for wound  care products and the home again due to insurance issues 07/24/17; the patient's wound actually looks somewhat better today. No drainage measurements are smaller still healthy-looking surface. We used silver collagen under PolyMen started last week. We have been bringing her in for a dressing change 07/31/17; patient's wound surface continued to look better and I think there is visible change in the dimensions of the wound on the right. Rims of epithelialization. We have been using silver collagen under PolyMen and bringing her in for a dressing change. There appears to be less drainage although she is still in need of the dressing change 08/07/17. Patient's wound surface continues to look better on both sides and the area on the right is definitely smaller. We have been using silver collagen and PolyMen. She feels that the drainage has been it has been better. I asked her about her vascular status. She went to see Dr. Aleda Grana at [facility] and had some form of ablation. I don't have much detail on this. I haven't my notes from 2016 that she was not a candidate for any further ablation but I don't have any more information on this. We had referred her to vein and vascular I don't think she ever went. He does not have a history of PAD although I don't have any information on this either. We don't even have ABIs in our record 08/14/17; we've been using silver collagen and PolyMen cover. And putting the patient and compression. She we are bringing her in as a nurse visit to change this because ofarge amount of drainage. We didn't the ABIs in clinic today since they had been done in many moons 1.2 bilaterally. She has been to see vein and vascular however this was at [facility] and she had ablation although I really don't have any information on this all seemed biking get a report. She is also been operatively debrided by plastic surgery and had a cell placed probably 8-12 months ago. This didn't have  a major effect. We've been making some gains with current dressings 08/19/17-She is here in follow-up evaluation for bilateral medial malleoli ulcers. She continues to tolerate debridement very poorly. We will continue with recently changed topical treatment; if no significant improvement may consider switching to Iodosorb/Iodoflex. 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 She has been using silver collagen and PolyMem. I believe she has been used and tried on Iodoflex before. 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 These have been very refractory over time. We have been using silver collagen and PolyMen. She is coming in once a week for a doctor's and once a week for nurse visits. We are actually making some progress 09/18/17; the patient's wounds are smaller especially on the right medial. She arrives today to upset to consider even washing these off with Anasept which I think is been part of the reason this is been closing. We've been using collagen covered in PolyMen otherwise. It is noted that she has a small area of folliculitis on the right medial calf that. As we are wrapping her legs I'll give her a short course of doxycycline to make sure this doesn't amount to anything. She is a long list of complaints today including imbalance, shortness of breath on exertion, inversion of her left ankle. With regards to the latter complaints she is been to see orthopedics and they offered her a tendon release surgery I believe but wanted her wounds to be closed first. I have recommended she go see her primary physician with regards to everything else. 09/25/17; patient's wounds are about the same size. We have made some progress bilaterally although not in recent  weeks. She will not allow me T wash these wounds with Anasept even if she is doing her cell. Wheeze we've been using collagen covered in PolyMen. Last week she had a small area of folliculitis this is now opened into a small wound. She completed 5 days of trimethoprim sulfamethoxazole 10/02/17; unfortunately the area on her left medial ankle is worse with a larger wound area towards the Achilles. The patient complains of a lot of pain. She will not allow debridement although visually I don't think there is anything to debridement in any case. We have been using silver collagen and PolyMen for several months now. Initially we are making some progress although I'm not really seeing that today. We will move back to [facility]. His admittedly this is a bit of a repeat however I'm hoping that his situation is different now. The patient tells me she had her leg on the left give out on her yesterday this is process some pain. 10/09/17; the patient is seen twice a week largely because of drainage issues coming out of the chronic medial bimalleolar wounds that are chronic. Last week the dimensions of the one on the left looks a little larger I changed her to [facility]. She comes in today with a history of terrible pain in the bilateral wound areas. She will not allow debridement. She will not even allow a tissue culture. There is no surrounding erythema no no evidence of cellulitis. We have been putting her Kerlix Coban man. She will not allow more aggressive compression as there was a suggestion to put her in 3 layer wraps. 10/16/17; large wounds on her bilateral medial malleoli. These are chronic. Not much change from last week. The surface looks have healthy but absolutely no epithelialization. A lot of pain little less so of drainage. She will not allow debridement or even washing these off in the vigorous fashion with Anasept. 10/23/17; large wounds on her bilateral malleoli which are  chronic. Some  improvement in terms of size perhaps on the right since last time I saw these. She states that after we increased the 3 layer compression there was some bleeding, when she came in for a nurse visit she did not want 3 layer compression put back on about our nurse managed to convince her. She has known chronic venous visit issues and I'm hoping to get her to tolerate the 3 layer compression. using Hydrofera Blue 10/30/17; absolutely no change in the condition of either wound although we've had some improvement in dimensions on the right.. Attempted to put her in 3 layer compression she didn't tolerated she is back in 2 layer compression. We've been using Hydrofera Blue We looked over her past records. She had venous reflux studies in November 2016. There was no evidence of deep venous reflux on the right. Superficial vein did not show the greater saphenous vein at think this is been previously ablated the small saphenous vein was within normal limits. The left deep venous system showed no DVT the vessels were positive for deep venous reflux in the posterior tibial veins at the ankle. The greater saphenous vein was surgically absent small saphenous vein was within normal limits. She went to vein and vascular at [facility]. I believe she had an ablation on the left greater saphenous vein. I'll update her reflux studies perhaps ever reviewed by vein and vascular. We've made absolutely no progress in these wounds. Will also try to read and TheraSkins through her insurance 11/06/17; W the patient apparently has a 2 week follow-up with vein and vascular I like him to review the whole issue with regards to her previous vascular workup by Dr. Aleda Grana. We've really made no progress on these wounds in many months. She arrives today with less viable looking surface on the left medial ankle wound. This was apparently looking about the same on Tuesday when she was here for nurse visit. 11/13/17; deep tissue  culture I did last time of the left lower leg showed multiple organisms without any predominating. In particular no Staphylococcus or group A strep were isolated. We sent her for venous reflux studies. She's had a previous left greater saphenous vein stripping and I think sclerotherapy of the right greater saphenous vein. She didn't really look at the lesser saphenous vein this both wounds are on the medial aspect. She has reflux in the common femoral vein and popliteal vein and an accessory vein on the right and the common femoral vein and popliteal vein on the left. I'm going to have her go to see vein and vascular just the look over things and see if anything else beside aggressive compression is indicated here. We have not been able to make any progress on these wounds in spite of the fact that the surface of the wounds is never look too bad. 11/20/17; no major change in the condition of the wounds. Patient reports a large amount of drainage. She has a lot of complaints of pain although enlisting her today I wonder if some of this at least his neuropathic rather than secondary to her wounds. She has an appointment with vein and vascular on 12/30/17. The refractory nature of these wounds in my mind at least need vein and vascular to look over the wounds the recent reflux studies we did and her history to see if anything further can be done here. I also note her gait is deteriorated quite a bit. Looks like she has inversion of her  foot on the right. She has a bilateral Trendelenburg gait. I wonder if this is neuropathic or perhaps multilevel radicular. 11/27/17; her wounds actually looks slightly better. Healthy-looking granulation tissue a scant amount of epithelialization. [place] healthcare will not pay for [employer]. They will play for tri layer Oasis and Dermagraft. This is not a diabetic ulcer. We'll try for the tri layer Oasis. She still complains of some drainage. She has a vein and vascular  appointment on 12/30/17 12/04/17; the wounds visually look quite good. Healthy-looking granulation with some degree of epithelialization. We are still waiting for response to our request for trial to try layer Oasis. Her appointment with vascular to review venous and arterial issues isn't sold the end of July 7/31. Not allow debridement or even vigorous cleansing of the wound surface. 12/18/17; slightly smaller especially on the right. 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 We've been using Hydrofera Blue. We still are looking into triple layer Oasis through her insurance 01/08/18 on evaluation today patient's wound actually appears to be showing signs of good improvement at this point in time. She has been tolerating the dressing changes without complication. Fortunately there does not appear to be any evidence of infection at this point in time. We have been utilizing silver nitrate which does seem to be of benefit for her which is also good news. Overall I'm very happy with how things seem to be both regards appearance as well as measurement. Patient did see Dr. Bridgett Larsson for evaluation on 12/30/17. In his assessment he felt that stripping would not likely add much more than chronic compression to the patient's healing process. His recommendation was to follow-up in three months with Dr. Doren Custard if she hasn't healed in order to consider referral back to you and see vascular where she previously was in a trial and was able to get her wound to heal. I'll be see what she feels she when you staying compression and he reiterated this as well. 01/13/18 on evaluation today patient appears to actually be doing very well in regard to her bilateral medial malleolus ulcers. She seems to have tolerated the chemical cauterization with silver nitrate last week she did have some pain through that evening but fortunately states that I'll be see since it seems to be doing better she is overall  pleased with the progress. 01/21/18; really quite a remarkable improvement since I've last seen these wounds. We started using silver nitrate specially on the islands of hyper granulation which for some reason her around the wound circumference. This is really done quite nicely. Primary dressing Hydrofera Blue under 4 layer compression. She seems to be able to hold out without a nurse rewrap. Follow-up in 1 week 01/28/18; we've continued the hydrofera blue but continued with chemical cauterization to the wound area that we started about a month ago for irregular hyper granulation. She is made almost stunning improvement in the overall wound dimensions. I was not really expecting this degree of improvement in these chronic wounds 02/05/18; we continue with Hydrofera Bluebut of also continued the aggressive chemical cauterization with silver nitrate. We made nice progress with the right greater than left wound. 02/12/18. We continued with Hydrofera Blue after aggressive chemical cauterization with silver nitrate. We appear to be making nice progress with both wound areas 02/19/2018; we continue with [facility] after washing the wounds vigorously with Anasept spray and chemical cauterization with silver nitrate. We are making excellent progress. The area on the right's just about closed 02/26/2018.  The area on the left medial ankle had too much necrotic debris today. I used a #5 curette we are able to get most of the soft. I continued with the silver nitrate to the much smaller wound on the right medial ankle she had a new area on her right lower pretibial area which she says was due to a role in her compression 03/05/2018; both wound areas look healthy. Not much change in dimensions from last week. I continue to use silver nitrate and Hydrofera Blue. The patient saw Dr. Doren Custard of vein and vascular. He felt she had venous stasis ulcers. He felt based on her previous arteriogram she should have  adequate circulation for healing. Also she has deep venous reflux but really no significant correctable superficial venous reflux at this time. He felt we should continue with conservative management including leg elevation and compression 04/02/2018; since we last saw this woman about a month ago she had a fall apparently suffered a pelvic fracture. I did not look up the x-ray. Nevertheless because of pain she literally was bedbound for 2 weeks and had home health coming out to change the dressing. Somewhat predictably this is resulted in considerable improvement in both wound areas. The right is just about closed on the medial malleolus and the left is about half the size. 04/16/2018; both her wounds continue to go down in size. Using Hydrofera Blue. 05/07/18; both her wounds appeared to be improving especially on the right where it is almost closed. We are using Hydrofera Blue 05/14/2018; slightly worse this week with larger wounds. Surface on the left medial not quite as good. We have been using Hydrofera Blue 05/21/18; again the wounds are slightly larger. Left medial malleolus slightly larger with eschar around the circumference. We have been using Hydrofera Blue undergoing a wraps for a prolonged period of time. This got a lot better when she was more recumbent due to a fall and a back injury. I change the primary dressing the silver alginate today. She did not tolerate a 4 layer compression previously although I may need to bring this up with her next time 05/28/2018; area on the left medial malleolus again is slightly larger with more drainage. Area on the right is roughly unchanged. She has a small area of folliculitis on the right medial just on the lower calf. This does not look ominous. 06/03/2018 left medial malleolus slightly smaller in a better looking surface. We used silver nitrate on this last time with silver alginate. The area on the right appears slightly smaller 1/10; left medial  malleolus slightly smaller. Small open area on the right. We used silver nitrate and silver alginate as of 2 weeks ago. We continue with the wound and compression. These got a lot better when she was off her feet 1/17; right medial malleolus wound is smaller. The left may be slightly smaller. Both surfaces look somewhat better. 1/24; both wounds are slightly smaller. Using silver alginate under Unna boots 1/31; both wounds appear smaller in fact the area on the right medial is just about closed. Surface eschar. We have been using silver alginate under Unna boots. The patient is less active now spends let much less time on her feet and I think this is contributed to the general improvement in the wound condition 2/7; both wounds appear smaller. I was hopeful the right medial would be closed however there there is still the same small open area. Slight amount of surface eschar on the left the dimensions  are smaller there is eschar but the wound edges appear to be free. We have been using silver alginate under Unna boot's 2/14; both wounds once again measure smaller. Circumferential eschar on the left medial. We have been using silver alginate under Unna boots with gradual improvement 2/21; the area on the right medial malleolus has healed. The area on the left is smaller. We have been using silver alginate and Unna boots. We can discharge wrapping the right leg she has 20/30 stockings at home she will need to protect the scar tissue in this area 2/28; the area on the right medial malleolus remains closed the patient has a compression stocking. The area on the left is smaller. We have been using silver alginate and Unna boots. 3/6 the area on the right medial ankle remains closed. Good edema control noted she is using her own compression stocking. The area on the left medial ankle is smaller. We have been managing this with silver alginate and Unna boots which we will continue today. 3/13; the area on  the right medial ankle remains closed and I'm declaring it healed today. When necessary the left is about the same still a healthy-looking surface but no major change and wound area. No evidence of infection and using silver alginate under unna and generally making considerable improvement 3/27 the area on the right medial ankle remains closed the area on the left is about the same as last week. Certainly not any worse we have been using silver alginate under an Unna boot 4/3; the area on the right medial ankle remains closed per the patient. We did not look at this wound. The wound on the left medial ankle is about the same surface looks healthy we have been using silver alginate under an Unna boot 4/10; area on the right medial ankle remains closed per the patient. We did not look at this wound. The wound on the left medial ankle is slightly larger. The patient complains that the [facility] caused burning pain all week. She also told us that she was a lot more active this week. Changed her back to silver alginate 4/17; right medial ankle still closed per the patient. Left medial ankle is slightly larger. Using silver alginate. She did not tolerate Hydrofera Blue on this area 4/24; right medial ankle remains closed we have not look at this. The left medial ankle continues to get larger today by about a centimeter. We have been using silver alginate under Unna boots. She complains about 4 layer compression as an alternative. She has been up on her feet working on her garden 5/8; right medial ankle remains closed we did not look at this. The left medial ankle has increased in size about 100%. We have been using silver alginate under Unna boots. She noted increased pain this week and was not surprised that the wound is deteriorated 5/15; no major change in SA however much less erythema ( one week of doxy ocellulitis). 5/22-65 year old female returns at 1 week to the clinic for left medial ankle  wound for which we have been using silver alginate under 3 layer compression She was placed on DOXY at last visit - the wound is wider at this visit. She is in 3 layer compression 5/29; change to [facility] last week. I had given her empiric doxycycline 2 weeks ago for a week. She is in 3 layer compression. She complains of a lot of pain and drainage on presentation today. 6/5; using Hydrofera Blue. I  gave her doxycycline recently empirically for erythema and pain around the wound. Believe her cultures showed enterococcus which not would not have been well covered by doxycycline nevertheless the wound looks better and I don't feel specifically that the enterococcus needs to be covered. She has a new what looks like a wrap injury on her lateral left ankle. 6/12; she is using Hydrofera Blue. She has a new area on the left anterior lower tibial area. This was a wrap injury last week. 6/19; the patient is using Hydrofera Blue. She arrived with marked inflammation and erythema around the wound and tenderness. 12/01/18 on evaluation today patient appears to be doing a little bit better based on what I'm hearing from the standpoint of lassos evaluation to this as far as the overall appearance of the wound is concerned. Then sometime substandard she typically sees Dr. Dellia Nims. Nonetheless overall very pleased with the progress that she's made up to this point. No fevers, chills, nausea, or vomiting noted at this time. 7/10; some improvement in the surface area. Aggressively debrided last week apparently. I went ahead with the debridement today although the patient does not tolerate this very well. We have been using Iodoflex. Still a fair amount of drainage 7/17; slightly smaller. Using Iodoflex. 7/24; no change from last week in terms of surface area. We have been using Iodoflex. Surface looks and continues to look somewhat better 7/31; surface area slightly smaller better looking surface. We have been  using Iodoflex. This is under Unna boot compression 8/7-Patient presents at 1 week with Unna boot and Iodoflex, wound appears better 8/14-Patient presents at 1 week with Iodoflex, we use the Unna boot, wound appears to be stable better.Patient is getting Botox treatment for the inversion of the foot for tendon release, Next week 8/21; we are using Iodoflex. Unna boot. The wound is stable in terms of surface area. Under illumination there is some areas of the wound that appear to be either epithelialized or perhaps this is adherent slough at this point I was not really clear. It did not wipe off and I was reluctant to debride this today. 8/28; we are using Iodoflex in an Unna boot. Seems to be making good improvement. 9/4; using Iodoflex and wound is slightly smaller. 9/18; we are using Iodoflex with topical silver nitrate when she is here. The wound continues to be smaller 10/2; patient missed her appointment last week due to GI issues. She left and Iodoflex based dressing on for 2 weeks. Wound is about the same size about the size of a dime on the left medial lower 10/9 we have been using Iodoflex on the medial left ankle wound. She has a new superficial probable wrap injury on the dorsal left ankle 10/16; we have been using Hydrofera Blue since last week. This is on the left medial ankle 10/23; we have been using Hydrofera Blue since 2 weeks ago. This is on the left medial ankle. Dimensions are better 11/6; using Hydrofera Blue. I think the wound is smaller but still not closed. Left medial ankle 11/13; we have been using Hydrofera Blue. Wound is certainly no smaller this week. Also the surface not as good. This is the remanent of a very large area on her left medial ankle. 11/20; using Sorbact since last week. Wound was about the same in terms of size although I was disappointed about the surface debris 12/11; 3-week follow-up. Patient was on vacation. Wound is measuring slightly larger we have  been using Sorbact. 12/18;  wound is about the same size however surface looks better last week after debridement. We have been using Sorbact under compression 1/15 wound is probably twice the size of last time increased in length nonviable surface. We have been using Sorbact. She was running a mild fever and missed her appointment last week 1/22; the wound is come down in size but under illumination still a very adherent debris we have been Hydrofera Blue that I changed her to last week 1/29; dimensions down slightly. We have been using Hydrofera Blue 2/19 dimensions are the same however there is rims of epithelialization under illumination. Therefore more the surface area may be epithelialized 2/26; the patient's wound actually measures smaller. The wound looks healthy. We have been using Hydrofera Blue. I had some thoughts about running Apligraf then I still may do that however this looks so much better this week we will delay that for now 3/5; the wound is small but about the same as last week. We have been using Hydrofera Blue. No debridement is required today. 3/19; the wound is about the size of a dime. Healthy looking wound even under illumination. We have been using Hydrofera Blue. No mechanical debridement is necessary 3/26; not much change from last week although still looks very healthy. We have been using Hydrofera Blue under Unna boots Patient was offered an ankle fusion by podiatry but not until the wound heals with a proceed with this. 4/9; the patient comes in today with her original wound on the medial ankle looking satisfactory however she has some uncontrolled swelling in the middle part of her leg with 2 new open areas superiorly just lateral to the tibia. I think this was probably a wrap issue. She said she felt uncomfortable during the week but did not call in. We have been using Hydrofera Blue 4/16; the wound on the medial ankle is about the same. She has innumerable small  areas superior to this across her mid tibia. I think this is probably folliculitis. She is also been working in the yard doing a lot of sweating 4/30; the patient issue on the upper areas across her mid tibia of all healed. I think this was excessive yard work if I remember. Her wound on the medial ankle is smaller. Some debris on this we have been using Hydrofera Blue under Unna boots 5/7; mid tibia. She has been using Hydrofera Blue under an Unna wrap. She is apparently going for her ankle surgery on June 3 10/28/19-Patient returns to clinic with the ankle wound, we are using Hydrofera Blue under Unna wrap, surgery is scheduled for her left foot for June 3 so she will be back for nurse visit next week READMISSION 01/17/2020 Mrs. Naples is a 65 year old woman we have had in this clinic for a long period of time with severe venous hypertension and refractory wounds on her medial lower legs and ankles bilaterally. 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 When she was off her legs for a prolonged period example when she fell and suffered a compression fracture things would heal up quite nicely. She is now retired and we managed to heal up the right medial leg wound. The left one was very tiny last time I saw this although still refractory. 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 It got to the point where this was interfering with her walking and she elected to proceed with a ankle arthrodesis to straighten her her ankle and leave her with a functional outcome for mobilization. The patient was referred to Dr. Doren Custard and really this took some time to arrange. Dr. Doren Custard saw her on 12/07/2019. Once again he verified that she had no arterial issues. She had previously had an angiogram several  years ago. Follow-up ABIs on the left showed an ABI of 1.12 with triphasic waveforms and a TBI of 0.92. She is felt to have chronic deep venous insufficiency but I do not think it was felt that anything could be done from about this from an ablation point of view. At the time Dr. Doren Custard saw this patient the wounds actually look closed via the pictures in his clinic. The patient finally underwent her surgery on 12/15/2019. This went reasonably well and there was a good anatomic outcome. She developed a small distal wound dehiscence on the lateral part of the surgical wound. However more problematically she is developed recurrence of the wound on the medial left ankle. There are actually 2 wounds here one in the distal lower leg and 1 pretty much at the level of the medial malleolus. It is a more distal area that is more problematic. She has been using Hydrofera Blue which started on Friday before this she was simply Ace wrapping. There was a culture done that showed Pseudomonas and she is on ciprofloxacin. A recent CNS on 8/11 was negative. The patient reports some pain but I generally think this is improving. She is using a cam boot completely nonweightbearing using a walker for pivot transfers and a wheelchair 8/24; not much improvement unfortunately she has a surgical wound on the lateral part in the venous insufficiency wound medially. The bottom part of the medial insufficiency wound is still necrotic there is exposed tendon here. We have been using Hydrofera Blue under compression. Her edema control is however better 8/31; patient in for follow-up of his surgical wound on the lateral part of her left leg and chronic venous insufficiency ulcers medially. We put her back in compression last week. She comes in today with a complaint of 3 or 4 days worth of increasing pain. She felt her cam walker was rubbing on the area on the back of her heel. However there is intense erythema seems more likely she  has cellulitis. She had 2 cultures done when she was seeing podiatry in the postop. One of them in late July showed Pseudomonas and she received a course of ciprofloxacin the other was negative on 8/11 she is allergic to penicillin with anaphylactoid complaints of hives oral swelling via information in epic 9/9; when I saw this patient last week she had intense anterior erythema around her wound on the right lateral heel and ankle and also into the right medial heel. Some of this was no doubt drainage and her walker boot however I was convinced she had cellulitis. I gave her Levaquin and Bactrim she is finishing up on this now. She is following up with Dr. [name] he saw her yesterday. He is taken her out of the walking boot of course she is still nonweightbearing. Her x-ray was negative for any worrisome features such as soft tissue air etc. Things are a lot better this week. She has home health. We have been using Hydrofera Blue under an [employer] which she put back on yesterday. I did  not wrap her last week 9/17; her surrounding skin looks a lot better. In fact the area on the left lateral ankle has just a scant amount of eschar. The only remaining wound is the large area on the left medial ankle. Probably about 60% of this is healthy granulation at the surface however she has a significant divot distally. This has adherent debris in it. I been using debridement and silver collagen to try and get this area to fill-in although I do not think we have made much progress this week 9/24; the patient's wound on the left medial ankle looks a lot better. The deeper divot area distally still requires debridement but this is cleaning up quite nicely we have been using silver collagen. The patient is complaining of swelling in her foot and is worried that that is contributing to the nonhealing of the ankle wound. She is also complaining of numbness in her anterior toes 10/4; left medial ankle. The small area  distally still has a divot with necrotic material that I have been debriding away. This has an undermining area. She is approved for Apligraf. She saw Dr. [name] her surgeon on 10/1. I think he declared himself is satisfied with the condition of things. Still nonweightbearing till the end of the month. 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 Her surgical wound is well closed. There is no evidence of infection 10/11; the patient arrived in clinic today with the expectation that we be able to put an Apligraf on this area after debridement however she arrives with a relatively large amount of green drainage on the dressing. The patient states that this started on Friday. She has not been systemically unwell. 10/19; culture I did last week showed both Enterococcus and Pseudomonas. I think this came in separate parts because I stopped her ciprofloxacin I gave her and prescribed her linezolid however now looking at the final culture result this is Pseudomonas which is resistant to quinolones. She has not yet picked up the linezolid apparently phone issues. We are also trying to get a topical antibiotic out of [place] in [place] they can be applied by home health. She is still having green drainage 10/16; the patient has her topical antibiotic from [facility] in [place]. This is a compounded gel with vancomycin and ciprofloxacin and gentamicin. We are applying this on the wound bed with silver alginate over the top with Unna boot wraps. She arrives in clinic today with a lot less ominous looking drainage although she is only use this topical preparation once the second time today. She sees Dr. [name] her surgeon on Friday she has home health changing the dressing 11/2; still using her compounded topical antibiotic under silver alginate. Surface is cleaning up there is less drainage. We had an Apligraf for her today and I elected to apply it. A light coating of  her antibiotic 04/25/2020 upon evaluation today patient appears to be doing well with regard to her ankle ulcer. There is a little bit of slough noted on the surface of the wound I am can have to perform sharp debridement to clear this away today. With that being said other than that fact overall I feel like she is making progress and we do see some new epithelial growth. There is also some improvement in the depth of the wound and that distal portion. There is little bit of slough there as well. 12/7; 2-week follow-up. Apligraf #3. Dimensions are smaller. Closing in  especially inferiorly. Still some surface debris. Still using the [employer] topical antibiotic but I told her that I don't think this needs to be renewed 12/21; 2-week follow-up. Apligraf #4 dimensions are smaller. Nice improvement 06/05/2020; 2-week follow-up. The patient's wound on the left medial ankle looks really excellent. Nice granulation. Advancing epithelialization no undermining no evidence of infection. We would have to reapply for another Apligraf but with the condition of this wound I did not feel strongly about it. We used Hydrofera Blue under the same degree of compression. She follows up with Dr. [name] her surgeon a week Friday 06/13/2020 upon evaluation today patient appears to be doing excellent in regard to her wound. She has been tolerating the dressing changes without complication. Fortunately there is no signs of active infection at this time. No fevers, chills, nausea, vomiting, or diarrhea. She was using Hydrofera Blue last week. 06/20/2020 06/20/2020 on evaluation today patient actually appears to be doing excellent in regard to her wound. This is measuring better and looking much better as well. She has been using the collagen that seems to be doing better for her as well even though the [facility] was and is not sticking or feeling as rough on her wound. She did see Dr. [name] on Friday he is very pleased he also  stated none of the hardware has shifted. That is great news 1/27; the patient has a small clean wound all that is remaining. I agree that this is too small to really consider an Apligraf. Under illumination the surface is looking quite good. We have been using collagen although the dimensions are not any better this week Electronic Signature(s) Signed: 06/28/2020 4:56:03 PM By: Linton Ham MD Entered By: Linton Ham on 06/28/2020 11:11:01 -------------------------------------------------------------------------------- Physical Exam Details Patient Name: Date of Service: [name], [name] 06/28/2020 9:45 A M Medical Record Number: [id number] Patient Account Number: [id number] Date of Birth/Sex: Treating RN: [birth year] (65 y.o. [name] Primary Care Provider: [name] Other Clinician: Referring Provider: Treating Provider/Extender: [name] in Treatment: 23 Constitutional Sitting or standing Blood Pressure is within target range for patient.. Pulse regular and within target range for patient.Marland Kitchen Respirations regular, non-labored and within target range.. Temperature is normal and within the target range for the patient.Marland Kitchen Appears in no distress. Cardiovascular Needle pulses are palpable. There is no uncontrolled edema. Notes Wound exam; no debridement is required. Under illumination the tissue looks quite healthy here. I am hopeful we can move onto final closure of this area Electronic Signature(s) Signed: 06/28/2020 4:56:03 PM By: Linton Ham MD Entered By: Linton Ham on 06/28/2020 11:13:43 -------------------------------------------------------------------------------- Physician Orders Details Patient Name: Date of Service: [name], [name] 06/28/2020 9:45 A M Medical Record Number: [id number] Patient Account Number: [id number] Date of Birth/Sex: Treating RN: 12-22-1955 (65 y.o. [name], Meta.Reding Primary Care Provider: [name] Other Clinician: Referring Provider: Treating Provider/Extender: [name] in Treatment: 23 Verbal / Phone Orders: No Diagnosis Coding ICD-10 Coding Code Description I87.332 Chronic venous hypertension (idiopathic) with ulcer and inflammation of left lower extremity L97.828 Non-pressure chronic ulcer of other part of left lower leg with other specified severity L97.328 Non-pressure chronic ulcer of left ankle with other specified severity Follow-up Appointments Return Appointment in 1 week. Bathing/ Shower/ Hygiene May shower with protection but do not get wound dressing(s) wet. Edema Control - Lymphedema / SCD / Other Elevate legs to the level of the heart or above for  30 minutes daily and/or when sitting, a frequency of: Avoid standing for long periods of time. Exercise regularly Compression stocking or Garment 20-30 mm/Hg pressure to: - right leg daily Wound Treatment Wound #15 - Malleolus Wound Laterality: Left, Medial Cleanser: Soap and Water 1 x Per Week/15 Days Discharge Instructions: May shower and wash wound with dial antibacterial soap and water prior to dressing change. Peri-Wound Care: Triamcinolone 15 (g) 1 x Per Week/15 Days Discharge Instructions: Use triamcinolone 15 (g) as directed Peri-Wound Care: Sween Lotion (Moisturizing lotion) 1 x Per Week/15 Days Discharge Instructions: Apply moisturizing lotion as directed Prim Dressing: PolyMem Silver Non-Adhesive Dressing, 4.25x4.25 in 1 x Per Week/15 Days ary Discharge Instructions: Apply to wound bed as instructed Secondary Dressing: Woven Gauze Sponge, Non-Sterile 4x4 in (Home Health) 1 x Per Week/15 Days Discharge Instructions: Apply over primary dressing as directed. Secondary Dressing: ABD Pad, 5x9 (Home Health) 1 x Per Week/15 Days Discharge Instructions: Apply over primary dressing as directed. Compression Wrap: Unnaboot w/Calamine, 4x10 (in/yd) (Home Health) 1 x Per Week/15  Days Discharge Instructions: Apply Unnaboot as directed. Electronic Signature(s) Signed: 06/28/2020 4:56:03 PM By: Linton Ham MD Signed: 06/28/2020 5:42:01 PM By: Deon Pilling Entered By: Deon Pilling on 06/28/2020 10:40:03 -------------------------------------------------------------------------------- Problem List Details Patient Name: Date of Service: [name], [name] 06/28/2020 9:45 A M Medical Record Number: [id number] Patient Account Number: [id number] Date of Birth/Sex: Treating RN: 12/09/1955 (65 y.o. 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 06/28/2020 9:45 A M Medical Record Number: [id number] Patient Account Number: [id number] Date of Birth/Sex: Treating RN: 11/10/1955 (65 y.o. [name] Primary Care Provider: [name] Other Clinician: Referring Provider: Treating  Provider/Extender: [name] in Treatment: 23 Subjective History of Present Illness (HPI) the remaining wound is over the left medial ankle. Similar wound over the right medial ankle healed largely with use of Apligraf. Most recently we have been using Hydrofera Blue over this wound with considerable improvement. The patient has been extensively worked up in the past for her venous insufficiency and she is not a candidate for antireflux surgery although I have none of the details available currently. 08/24/14; considerable improvement today. About 50% of this wound areas now epithelialized. The base of the wound appears to be healthier granulation.as opposed to last week when she had deteriorated a considerable improvement 08/17/14; unfortunately the wound has regressed somewhat. The areas of epithelialization from the superior aspect are not nearly as healthy as they were last week. The patient thinks her Hydrofera Blue slipped. 09/07/14; unfortunately the area has markedly regressed in the 2 weeks since I've seen this. There is an odor surrounding erythema. The healthy granulation tissue that we  had at the base of the wound now is a dusky color. The nurse reports green drainage 09/14/14; the area looks somewhat better than last week. There is less erythema and less drainage. The culture I did did not show any growth. Nevertheless I think it is better to continue the Cipro and doxycycline for a further week. The remaining wound area was debridement. 09/21/14. Wound did not require debridement last week. Still less erythema and less drainage. She can complete her antibiotics. The areas of epithelialization in the superior aspect of the wound do not look as healthy as they did some weeks ago 10/05/14 continued improvement in the condition of this wound. There is advancing epithelialization. Less aggressive debridement required 10/19/14 continued improvement in the condition and  volume of this wound. Less aggressive debridement to the inferior part of this to remove surface slough and fibrinous eschar 11/02/14 no debridement is required. The surface granulation appears healthy although some of her islands of epithelialization seem to have regressed. No evidence of infection 11/16/14; lites surface debridement done of surface eschar. The wound does not look to be unhealthy. No evidence of infection. Unfortunately the patient has had podiatry issues in the right foot and for some reason has redeveloped small surface ulcerations in the medial right ankle. Her original presentation involved wounds in this area 11/23/14 no debridement. The area on the right ankle has enlarged. The left ankle wound appears stable in terms of the surface although there is periwound inflammation. There has been regression in the amount of new skin 11/30/14 no debridement. Both wound areas appear healthy. There was no evidence of infection. The the new area on the right medial ankle has enlarged although that both the surfaces appear to be stable. 12/07/14; Debridement of the right medial ankle wound. No no debridement was done on the left. 12/14/14 no major change in and now bilateral medial ankle wounds. Both of these are very painful but the no overt evidence of infection. She has had previous venous ablation 12/21/14; patient states that her right medial ankle wound is considerably more painful last week than usual. Her left is also somewhat painful. She could not tolerate debridement. The right medial ankle wound has fibrinous surface eschar 12/28/14 this is a patient with severe bilateral venous insufficiency ulcers. For a considerable period of time we actually had the one on the right medial ankle healed however this recently opened up again in June. The left medial ankle wound has been a refractory area with some absent flows. We had some success with Hydrofera Blue on this area and it literally  closed by 50% however it is recently opened up Foley. Both of these were debridement today of surface eschar. She tolerates this poorly 01/25/15: No change in the status of this. Thick adherent escar. Very poor tolerance of any attempt at debridement. I had healed the right medial malleolus wound for a considerable amount of time and had the left one down to about 50% of the volume although this is totally regressed over the last 48 weeks. Further the right leg has reopened. she is trying to make a appointment with pain and vascular, previous ablations with Dr. Aleda Grana. I do not believe there is an arterial insufficiency issue here 02/01/15 the status of the adherent eschar bilaterally is actually improved. No debridement was done. She did not manage to get vascular studies done 02/08/15 continued debridement of the area was done today. The slough is less adherent and comes off with less  pressure. There is no surrounding infection peripheral pulses are intact 02/15/15 selective debridement with a disposable curette. Again the slough is less adherent and comes off with less difficulty. No surrounding infection peripheral pulses are intact. 02/22/15 selective debridement of the right medial ankle wound. Slough comes off with less difficulty. No obvious surrounding infection peripheral pulses are intact I did not debridement the one on the left. Both of these are stable to improved 03/01/15 selective debridement of both wound areas using a curette to. Adherent slough cup soft with less difficulty. No obvious surrounding infection. The patient tells me that 2 days ago she noted a rash above the right leg wrap. She did not have this on her lower legs when she change this over she arrives with widespread left greater than right almost folliculitis-looking rash which is extremely pruritic. I don't see anything to culture here. There is no rash on the rest of her body. She feels well systemically. 03/08/15;  selective debridement of both wounds using a curette. Base of this does not look unhealthy. She had limegreen drainage coming out of the left leg wound and describes a lot of drainage. The rash on her left leg looks improved to. No cultures were done. 03/22/15; patient was not here last week. Basal wounds does not look healthy and there is no surrounding erythema. No drainage. There is still a rash on the left leg that almost looks vasculitic however it is clearly limited to the top of where the wrap would be. 04/05/15; on the right required a surgical debridement of surface eschar and necrotic subcutaneous tissue. I did not debridement the area on the left. These continue to be large open wounds that are not changing that much. We were successful at one point in healing the area on the right, and at the same time the area on the left was roughly half the size of current measurements. I think a lot of the deterioration has to do with the prolonged time the patient is on her feet at work 04/19/15 I attempted-like surface debridement bilaterally she does not tolerate this. She tells me that she was in allergic care yesterday with extreme pain over her left lateral malleolus/ankle and was told that she has an "sprain" 05/03/15; large bilateral venous insufficiency wounds over the medial malleolus/medial aspect of her ankles. She complains of copious amounts of drainage and his usual large amounts of pain. There is some increasing erythema around the wound on the right extending into the medial aspect of her foot to. historically she came in with these wounds the right one healed and the left one came down to roughly half its current size however the right one is reopened and the left is expanded. 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 05/10/15 large bilateral venous insufficiency wounds. There is less adherence surface left however the surface culture  that I did last week grew pseudomonas therefore bilateral selective debridement score necessary. There is surrounding erythema. The patient describes severe bilateral drainage and a lot of pain in the left ankle. Apparently her podiatrist was were ready to do a cortisone shot 05/17/15; the patient complains of pain and again copious amounts of drainage. 05/24/15; we used Iodo flex last week. Patient notes considerable improvement in wound drainage. Only needed to change this once. 05/31/15; we continued Iodoflex; the base of these large wounds bilaterally is not too bad but there is probably  likely a significant bioburden here. I would like to debridement just doesn't tolerate it. 06/06/14 I would like to continue the Iodoflex although she still hasn't managed to obtain supplies. She has bilateral medial malleoli or large wounds which are mostly superficial. Both of them are covered circumferentially with some nonviable fibrinous slough although she tolerates debridement very poorly. She apparently has an appointment for an ablation on the right leg by interventional radiology. 06/14/15; the patient arrives with the wounds and static condition. We attempted a debridement although she does not do well with this secondary to pain. I 07/05/15; wounds are not much smaller however there appears to be a cleaner granulating base. The left has tight fibrinous slough greater than the right. Debridement is tolerated poorly due to pain. Iodoflex is done more for these wounds in any of the multitude of different dressings I have tried on the left 1 and then subsequently the right. 07/12/15; no change in the condition of this wound. I am able to do an aggressive debridement on the right but not the left. She simply cannot tolerate it. We have been using Iodoflex which helps somewhat. It is worthwhile remembering that at one point we healed the right medial ankle wound and the left was about 25% of the current  circumference. We have suggested returning to vascular surgery for review of possible further ablations for one reason or another she has not been able to do this. 07/26/15 no major change in the condition of either wound on her medial ankle. I did not attempt to debridement of these. She has been aggressively scrubbing these while she is in the shower at home. She has her supply of Iodoflex which seems to have done more for these wounds then anything I have put on recently. 08/09/15 wound area appears larger although not verified by measurements. Using Iodoflex 09/05/2015 -- she was here for avisit today but had significant problems with the wound and I was asked to see her for a physician opinion. I have summarize that this lady has had surgery on her left lower extremity about 10 years ago where the possible veins stripping was done. She has had an opinion from interventional radiology around November 2016 where no further sclerotherapy was ordered. The patient works 12 hours a day and stands on a concrete floor with work boots and is unable to get the proper compression she requires and cannot elevate her limbs appropriately at any given time. She has recently grown Pseudomonas from her wound culture but has not started her ciprofloxacin which was called in for her. 09/13/15 this continues to be a difficult situation for this patient. At one point I had this wound down to a 1.5 x 1.5" wound on her left leg. This is deteriorated and the right leg has reopened. She now has substantial wounds on her medial calcaneus, malleoli and into her lower leg. One on the left has surface eschar but these are far too painful for me to debridement here. She has a vascular surgery appointment next week to see if anything can be done to help here. I think she has had previous ablations several years ago at [facility]. She has no major edema. She tells me that she did not get product last time [facility]] and went  for several days without it. She continues to work in work boots 12 hours a day. She cannot get compression/4-layer under her work boots. 09/20/15 no major change. Periwound edema control was not very  good. Her point with pain and vascular is next Wednesday the 25th 09/28/15; the patient is seen vascular surgery and is apparently scheduled for repeat duplex ultrasounds of her bilateral lower legs next week. 10/05/15; the patient was seen by Dr. Doren Custard of vascular surgery. He feels that she should have arterial insufficiency excluded as cause/contributed to her nonhealing stage she is therefore booked for an arteriogram. She has apparently monophasic signals in the dorsalis pedis pulses. She also of course has known severe chronic venous insufficiency with previous procedures as noted previously. I had another long discussion with the patient today about her continuing to work 12 hour shifts. I've written her out for 2 months area had concerns about this as her work location is currently undergoing significant turmoil and this may lead to her termination. She is aware of this however I agree with her that she simply cannot continue to stand for 12 hours multiple days a week with the substantial wound areas she has. 10/19/15; the Dr. Doren Custard appointment was largely for an arteriogram which was normal. She does not have an arterial issue. He didn't make a comment about her chronic venous insufficiency for which she has had previous ablations. Presumably it was not felt that anything additional could be done. The patient is now out of work as I prescribed 2 weeks ago. Her wounds look somewhat less aggravated presumably because of this. I felt I would give debridement another try today 10/25/15; no major change in this patient's wounds. We are struggling to get her product that she can afford into her own home through her insurance. 11/01/15; no major change in the patient's wounds. I have been using silver alginate  as the most affordable product. I spoke to Dr. Marla Roe last week with her requested take her to the OR for surgical debridement and placement of ACEL. Dr. Marla Roe told me that she would be willing to do this however [facility] will not cover this, fortunately the patient has [place] healthcare of some variant 11/08/15; no major change in the patient's wounds. She has been completely nonviable surface that this but is in too much pain with any attempted debridement are clinic. I have arranged for her to see Dr. Marla Roe ham of plastic surgery and this appointment is on Monday. I am hopeful that they will take her to the OR for debridement, possible ACEL ultimately possible skin graft 11/22/15 no major change in the patient's wounds over her bilateral medial calcaneus medial malleolus into the lower legs. Surface on these does not look too bad however on the left there is surrounding erythema and tenderness. This may be cellulitis or could him sleepy tinea. 11/29/15; no major changes in the patient's wounds over her bilateral medial malleolus. There is no infection here and I don't think any additional antibiotics are necessary. There is now plan to move forward. She sees Dr. Marla Roe in a week's time for preparation for operative debridement and ACEL placement I believe on 7/12. She then has a follow-up appointment with Dr. Marla Roe on 7/21 12/28/15; the patient returns today having been taken to the [place] by Dr. Marla Roe 12/12/15 she underwent debridement, intraoperative cultures [which were negative]. She had placement of a wound VAC. Parent really ACEL was not available to be placed. The wound VAC foam apparently adhered to the wound since then she's been using silver alginate, Xeroform under Ace wraps. She still says there is a lot of drainage and a lot of pain 01/31/16; this is  a patient I see monthly. I had referred her to Dr. Marla Roe him of plastic surgery for large wounds on  her bilateral medial ankles. She has been to the OR twice once in early July and once in early August. She tells me over the last 3 weeks she has been using the wound VAC with ACEL underneath it. On the right we've simply been using silver alginate. Under Kerlix Coban wraps. 02/28/16; this is a patient I'm currently seeing monthly. She is gone on to have a skin graft over her large venous insufficiency ulcer on the left medial ankle. This was done by Dr. Marla Roe him. The patient is a bit perturbed about why she didn't have one on her right medial ankle wound. She has been using silver alginate to this. 03/06/16; I received a phone call from her plastic surgery Dr. Marla Roe. She expressed some concern about the viability of the skin graft she did on the left medial ankle wound. Asked me to place Endoform on this. She told me she is not planning to do a subsequent skin graft on the right as the left one did not take very well. I had placed Hydrofera Blue on the right 03/13/16; continue to have a reasonably healthy wound on the right medial ankle. Down to 3 mm in terms of size. There is epithelialization here. The area on the left medial ankle is her skin graft site. I suppose the last week this looks somewhat better. She has an open area inferiorly however in the center there appears to be some viable tissue. There is a lot of surface callus and eschar that will eventually need to come off however none of this looked to be infected. Patient states that the is able to keep the dressing on for several days which is an improvement. 03/20/16 no major change in the circumference of either wound however on the left side the patient was at Dr. Eusebio Friendly office and they did a debridement of left wound. 50% of the wound seems to be epithelialized. I been using Endoform on the left Hydrofera Blue in the right 03/27/16; she arrives today with her wound is not looking as healthy as they did last week. The  area on the right clearly has an adherent surface to this a very similar surface on the left. Unfortunately for this patient this is all too familiar problem. Clearly the Endoform is not working and will need to change that today that has some potential to help this surface. She does not tolerate debridement in this clinic very well. She is changing the dressing wants 04/03/16; patient arrives with the wounds looking somewhat better especially on the right. Dr. Migdalia Dk change the dressing to silver alginate when she saw her on Monday and also sold her some compression socks. The usefulness of the latter is really not clear and woman with severely draining wounds. 04/10/16; the patient is doing a bit of an experiment wearing the compression stockings that Dr. Migdalia Dk provided her to her left leg and the out of legs based dressings that we provided to the right. 05/01/16; the patient is continuing to wear compression stockings Dr. Migdalia Dk provided her on the left that are apparently silver impregnated. She has been using Iodoflex to the right leg wound. Still a moderate amount of drainage, when she leaves here the wraps only last for 4 days. She has to change the stocking on the left leg every night 05/15/16; she is now using compression stockings bilaterally provided by  Dr. Marla Roe. She is wearing a nonadherent layer over the wounds so really I don't think there is anything specific being done to this now. She has some reduction on the left wound. The right is stable. I think all healing here is being done without a specific dressing 06/09/16; patient arrives here today with not much change in the wound certainly in diameter to large circular wounds over the medial aspect of her ankle bilaterally. Under the light of these services are certainly not viable for healing. There is no evidence of surrounding infection. She is wearing compression stockings with some sort of silver impregnation as prescribed by  Dr. Marla Roe. She has a follow-up with her tomorrow. 06/30/16; no major change in the size or condition of her wounds. These are still probably covered with a nonviable surface. She is using only her purchase stockings. She did see Dr. Marla Roe who seemed to want to apply Dakin's solution to this I'm not extreme short what value this would be. I would suggest Iodoflex which she still has at home. 07/28/16; I follow Mrs. Reeder episodically along with Dr. Marla Roe. 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 She has been applying a topical collagen ointment to both wounds with Adaptic. I don't think Dr. Marla Roe is planning to take her back to the OR. 08/19/16; I follow Mrs. Jeneen Rinks on a monthly basis along with Dr. Marla Roe of plastic surgery. 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 I been following her for a number of years. At one point I was able to get the right medial malleolus wound to heal and had the left medial malleolus down to about half its current size however and I had to send her to plastic surgery for an operative debridement. Since then things have been stable to slightly improve the area on the right is slightly better one in the left about the same although there is much less adherent surface than I'm used to with this patient. She is using some form of liquid collagen gel that Dr. Marla Roe provided a Kerlix cover with the patient's own pressure stockings. She tells me that she has extreme pain in both ankles and along the lateral aspect of both feet. She has been unable to work for some period of time. She is telling me she is retiring at the beginning of April. She sees Dr. Doran Durand of orthopedics next week 09/22/16; patient has not seen Dr. Marla Roe since the last time she is here. I'm not really sure what she is using to the wounds other than  bits and pieces of think she had left over including most recently Hydrofera Blue. She is using juxtalite stockings. She is having difficulty with her husband's recent illness "stroke". She is having to transport him to various doctors appointments. Dr. Marla Roe left her the option of a repeat debridement with ACEL however she has not been able to get the time to follow-up on this. She continues to have a fair amount of drainage out of these wounds with certainly precludes leaving dressings on all week 10/13/16; patient has not seen Dr. Marla Roe since she was last in our clinic. I'm not really sure what she is doing with the wounds, we did try to get her [facility] and I think she is actually using this most of the time. Because of drainage she states she has to change this every second day  although this is an improvement from what she used to do. She went to see Dr. Doran Durand who did not think she had a muscular issue with regards to her feet, he referred her to a neurologist and I think the appointment is sometime in June. I changed her back to Iodoflex which she has used in the past but not recently. 11/03/16; the patient has been using Iodoflex although she ran out of this. Still claims that there is a lot of drainage although the wound does not look like this. No surrounding erythema. She has not been back to see Dr. Marla Roe 11/24/16; the patient has been using Iodoflex again but she ran out of it 2 or 3 days ago. There is no major change in the condition of either one of these wounds in fact they are larger and covered in a thick adherent surface slough/nonviable tissue especially on the left. She does not tolerate mechanical debridement in our clinic. Going back to see Dr. Marla Roe of plastic surgery for an operative debridement would seem reasonable. 12/15/16; the patient has not been back to see Dr. Marla Roe. She is been dealing with a series of illnesses and her husband which of  monopolized her time. She is been using Sorbact which we largely supplied. She states the drainage is bad enough that it maximum she can go 2-3 days without changing the dressing 01/12/2017 -- the patient has not been back for about 4 weeks and has not seen Dr. Marla Roe not does she have any appointment pending. 01/23/17; patient has not seen Dr. Marla Roe even though I suggested this previously. She is using Santyl that was suggested last week by Dr. Con Memos this Cost her $16 through her insurance which is indeed surprising 02/12/17; continuing Santyl and the patient is changing this daily. A lot of drainage. She has not been back to see plastic surgery she is using an Ace wrap. Our intake nurse suggested wrap around stockings which would make a good reasonable alternative 02/26/17; patient is been using Santyl and changing this daily due to drainage. She has not been to see plastic surgery she uses in April Ace wrap to control the edema. She did obtain extremitease stockings but stated that the edema in her leg was to big for these 03/20/17; patient is using Santyl and Anasept. Surfaces looked better today the area on the right is actually measuring a little smaller. She has states she has a lot of pain in her feet and ankles and is asking for a consult to pain control which I'll try to help her with through our case manager. 04/10/17; the patient arrives with better-looking wound surfaces and is slightly smaller wound on the left she is using a combination of Santyl and Anasept. She has an appointment or at least as started in the pain control center associated with [facility] 05/14/17; this is a patient who I followed for a prolonged period of time. 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 At one point I had this down to a much smaller wound on the left however these reopened and we've never been able to get these to heal. She has been using Santyl and Anasept gel  although 2 weeks ago she ran out of the Anasept gel. She has a stable appearance of the wound. She is going to the wound care clinic at [facility]. They wanted do a nerve block/spinal block although she tells me she is reluctant to go forward with that. 05/21/17;  this is a patient I have followed for many years. 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 Chronic pain and deformity in her ankles as well. She is been to see plastic surgery as well as orthopedics. Using PolyMem AG most recently/Kerramax/ABDs and 2 layer compression. She has managed to keep this on and she is coming in for a nurse check to change the dressing on Tuesdays, we see her on Fridays 06/05/17; really quite a good looking surface and the area especially on the right medial has contracted in terms of dimensions. Well granulated healthy-looking tissue on both sides. Even with an open curet there is nothing that even feels abnormal here. This is as good as I've seen this in quite some time. We have been using PolyMem AG and bringing her in for a nurse check 06/12/17; really quite good surface on both of these wounds. The right medial has contracted a bit left is not. We've been using PolyMem and AG and she is coming in for a nurse visit 06/19/17; we have been using PolyMem AG and bringing her in for a nurse check. Dimensions of her wounds are not better but the surfaces looked better bilaterally. She complained of bleeding last night and the left wound and increasing pain bilaterally. She states her wound pain is more neuropathic than just the wounds. There was some suggestion that this was radicular from her pain management doctor in talking to her it is really difficult to sort this out. 06/26/17; using PolyMem and AG and bringing her in for a nurse check as All of this and reasonably stable condition. Certainly not improved. The dimensions on the lateral part of the right leg look better but not really  measuring better. The medial aspect on the left is about the same. 07/03/16; we have been using PolyMen AG and bringing her in for a nurse check to change the dressings as the wounds have drainage which precludes once weekly changing. We are using all secondary absorptive dressings.our intake nurse is brought up the idea of using a wound VAC/snap VAC on the wound to help with the drainage to see if this would result in some contraction. This is not a bad idea. The area on the right medial is actually looking smaller. Both wounds have a reasonable-looking surface. There is no evidence of cellulitis. The edema is well controlled 07/10/17; the patient was denied for a snap VAC by her insurance. The major issue with these wounds continues to be drainage. We are using wicked PolyMem AG and she is coming in for a nurse visit to change this. The wounds are stable to slightly improved. The surface looks vibrant and the area on the right certainly has shrunk in size but very slowly 07/17/17; the patient still has large wounds on her bilateral medial malleoli. Surface of both of these wounds looks better. The dimensions seem to come and go but no consistent improvement. There is no epithelialization. We do not have options for advanced treatment products due to insurance issues. They did not approve of the wound VAC to help control the drainage. More recently we've been using PolyMem and AG wicked to allow drainage through. We have been bringing her in for a nurse visit to change this. We do not have a lot of options for wound care products and the home again due to insurance issues 07/24/17; the patient's wound actually looks somewhat better today. No drainage measurements are smaller still healthy-looking surface. We used silver  collagen under PolyMen started last week. We have been bringing her in for a dressing change 07/31/17; patient's wound surface continued to look better and I think there is visible change in  the dimensions of the wound on the right. Rims of epithelialization. We have been using silver collagen under PolyMen and bringing her in for a dressing change. There appears to be less drainage although she is still in need of the dressing change 08/07/17. Patient's wound surface continues to look better on both sides and the area on the right is definitely smaller. We have been using silver collagen and PolyMen. She feels that the drainage has been it has been better. I asked her about her vascular status. She went to see Dr. Aleda Grana at [facility] and had some form of ablation. I don't have much detail on this. I haven't my notes from 2016 that she was not a candidate for any further ablation but I don't have any more information on this. We had referred her to vein and vascular I don't think she ever went. He does not have a history of PAD although I don't have any information on this either. We don't even have ABIs in our record 08/14/17; we've been using silver collagen and PolyMen cover. And putting the patient and compression. She we are bringing her in as a nurse visit to change this because ofarge amount of drainage. We didn't the ABIs in clinic today since they had been done in many moons 1.2 bilaterally. She has been to see vein and vascular however this was at [facility] and she had ablation although I really don't have any information on this all seemed biking get a report. She is also been operatively debrided by plastic surgery and had a cell placed probably 8-12 months ago. This didn't have a major effect. We've been making some gains with current dressings 08/19/17-She is here in follow-up evaluation for bilateral medial malleoli ulcers. She continues to tolerate debridement very poorly. We will continue with recently changed topical treatment; if no significant improvement may consider switching to Iodosorb/Iodoflex. 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 She has been using silver collagen and PolyMem. I believe she has been used and tried on Iodoflex before. 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 These have been very refractory over time. We have been using silver collagen and PolyMen. She is coming in once a week for a doctor's and once a week for nurse visits. We are actually making some progress 09/18/17; the patient's wounds are smaller especially on the right medial. She arrives today to upset to consider even washing these off with Anasept which I think is been part of the reason this is been closing. We've been using collagen covered in PolyMen otherwise. It is noted that she has a small area of folliculitis on the right medial calf that. As we are wrapping her legs I'll give her a short course of doxycycline to make sure this doesn't amount to anything. She is a long list of complaints today including imbalance, shortness of breath on exertion, inversion of her left ankle. With regards to the latter complaints she is been to see orthopedics and they offered her a tendon release surgery I believe but wanted her wounds to be  closed first. I have recommended she go see her primary physician with regards to everything else. 09/25/17; patient's wounds are about the same size. We have made some progress bilaterally although not in recent weeks. She will not allow me T wash these wounds with Anasept even if she is doing her cell. Wheeze we've been using collagen covered in PolyMen. Last week she had a small area of folliculitis this is now opened into a small wound. She completed 5 days of trimethoprim sulfamethoxazole 10/02/17; unfortunately the area on her left medial ankle is worse with a larger wound area towards the  Achilles. The patient complains of a lot of pain. She will not allow debridement although visually I don't think there is anything to debridement in any case. We have been using silver collagen and PolyMen for several months now. Initially we are making some progress although I'm not really seeing that today. We will move back to [facility]. His admittedly this is a bit of a repeat however I'm hoping that his situation is different now. The patient tells me she had her leg on the left give out on her yesterday this is process some pain. 10/09/17; the patient is seen twice a week largely because of drainage issues coming out of the chronic medial bimalleolar wounds that are chronic. Last week the dimensions of the one on the left looks a little larger I changed her to [facility]. She comes in today with a history of terrible pain in the bilateral wound areas. She will not allow debridement. She will not even allow a tissue culture. There is no surrounding erythema no no evidence of cellulitis. We have been putting her Kerlix Coban man. She will not allow more aggressive compression as there was a suggestion to put her in 3 layer wraps. 10/16/17; large wounds on her bilateral medial malleoli. These are chronic. Not much change from last week. The surface looks have healthy but absolutely no epithelialization. A lot of pain little less so of drainage. She will not allow debridement or even washing these off in the vigorous fashion with Anasept. 10/23/17; large wounds on her bilateral malleoli which are chronic. Some improvement in terms of size perhaps on the right since last time I saw these. She states that after we increased the 3 layer compression there was some bleeding, when she came in for a nurse visit she did not want 3 layer compression put back on about our nurse managed to convince her. She has known chronic venous visit issues and I'm hoping to get her to tolerate the 3 layer  compression. using Hydrofera Blue 10/30/17; absolutely no change in the condition of either wound although we've had some improvement in dimensions on the right.. Attempted to put her in 3 layer compression she didn't tolerated she is back in 2 layer compression. We've been using Hydrofera Blue We looked over her past records. She had venous reflux studies in November 2016. There was no evidence of deep venous reflux on the right. Superficial vein did not show the greater saphenous vein at think this is been previously ablated the small saphenous vein was within normal limits. The left deep venous system showed no DVT the vessels were positive for deep venous reflux in the posterior tibial veins at the ankle. The greater saphenous vein was surgically absent small saphenous vein was within normal limits. She went to vein and vascular at [facility]. I believe she had an ablation on the left greater saphenous  vein. I'll update her reflux studies perhaps ever reviewed by vein and vascular. We've made absolutely no progress in these wounds. Will also try to read and TheraSkins through her insurance 11/06/17; W the patient apparently has a 2 week follow-up with vein and vascular I like him to review the whole issue with regards to her previous vascular workup by Dr. Aleda Grana. We've really made no progress on these wounds in many months. She arrives today with less viable looking surface on the left medial ankle wound. This was apparently looking about the same on Tuesday when she was here for nurse visit. 11/13/17; deep tissue culture I did last time of the left lower leg showed multiple organisms without any predominating. In particular no Staphylococcus or group A strep were isolated. We sent her for venous reflux studies. She's had a previous left greater saphenous vein stripping and I think sclerotherapy of the right greater saphenous vein. She didn't really look at the lesser saphenous vein this  both wounds are on the medial aspect. She has reflux in the common femoral vein and popliteal vein and an accessory vein on the right and the common femoral vein and popliteal vein on the left. I'm going to have her go to see vein and vascular just the look over things and see if anything else beside aggressive compression is indicated here. We have not been able to make any progress on these wounds in spite of the fact that the surface of the wounds is never look too bad. 11/20/17; no major change in the condition of the wounds. Patient reports a large amount of drainage. She has a lot of complaints of pain although enlisting her today I wonder if some of this at least his neuropathic rather than secondary to her wounds. She has an appointment with vein and vascular on 12/30/17. The refractory nature of these wounds in my mind at least need vein and vascular to look over the wounds the recent reflux studies we did and her history to see if anything further can be done here. I also note her gait is deteriorated quite a bit. 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 She has a bilateral Trendelenburg gait. I wonder if this is neuropathic or perhaps multilevel radicular. 11/27/17; her wounds actually looks slightly better. Healthy-looking granulation tissue a scant amount of epithelialization. [place] healthcare will not pay for [employer]. They will play for tri layer Oasis and Dermagraft. This is not a diabetic ulcer. We'll try for the tri layer Oasis. She still complains of some drainage. She has a vein and vascular appointment on 12/30/17 12/04/17; the wounds visually look quite good. Healthy-looking granulation with some degree of epithelialization. We are still waiting for response to our request for trial to try layer Oasis. Her appointment with vascular to review venous and arterial issues isn't sold the end of July 7/31. Not allow debridement or even vigorous cleansing of the wound  surface. 12/18/17; slightly smaller especially on the right. 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 We've been using Hydrofera Blue. We still are looking into triple layer Oasis through her insurance 01/08/18 on evaluation today patient's wound actually appears to be showing signs of good improvement at this point in time. She has been tolerating the dressing changes without complication. Fortunately there does not appear to be any evidence of infection at this point in time. We have been utilizing silver nitrate which does seem to be of benefit for her which  is also good news. Overall I'm very happy with how things seem to be both regards appearance as well as measurement. Patient did see Dr. Bridgett Larsson for evaluation on 12/30/17. In his assessment he felt that stripping would not likely add much more than chronic compression to the patient's healing process. His recommendation was to follow-up in three months with Dr. Doren Custard if she hasn't healed in order to consider referral back to you and see vascular where she previously was in a trial and was able to get her wound to heal. I'll be see what she feels she when you staying compression and he reiterated this as well. 01/13/18 on evaluation today patient appears to actually be doing very well in regard to her bilateral medial malleolus ulcers. She seems to have tolerated the chemical cauterization with silver nitrate last week she did have some pain through that evening but fortunately states that I'll be see since it seems to be doing better she is overall pleased with the progress. 01/21/18; really quite a remarkable improvement since I've last seen these wounds. We started using silver nitrate specially on the islands of hyper granulation which for some reason her around the wound circumference. This is really done quite nicely. Primary dressing Hydrofera Blue under 4 layer compression. She seems to be able to hold out without a  nurse rewrap. Follow-up in 1 week 01/28/18; we've continued the hydrofera blue but continued with chemical cauterization to the wound area that we started about a month ago for irregular hyper granulation. She is made almost stunning improvement in the overall wound dimensions. I was not really expecting this degree of improvement in these chronic wounds 02/05/18; we continue with Hydrofera Bluebut of also continued the aggressive chemical cauterization with silver nitrate. We made nice progress with the right greater than left wound. 02/12/18. We continued with Hydrofera Blue after aggressive chemical cauterization with silver nitrate. We appear to be making nice progress with both wound areas 02/19/2018; we continue with [facility] after washing the wounds vigorously with Anasept spray and chemical cauterization with silver nitrate. We are making excellent progress. The area on the right's just about closed 02/26/2018. The area on the left medial ankle had too much necrotic debris today. I used a #5 curette we are able to get most of the soft. I continued with the silver nitrate to the much smaller wound on the right medial ankle she had a new area on her right lower pretibial area which she says was due to a role in her compression 03/05/2018; both wound areas look healthy. Not much change in dimensions from last week. I continue to use silver nitrate and Hydrofera Blue. The patient saw Dr. Doren Custard of vein and vascular. He felt she had venous stasis ulcers. He felt based on her previous arteriogram she should have adequate circulation for healing. Also she has deep venous reflux but really no significant correctable superficial venous reflux at this time. He felt we should continue with conservative management including leg elevation and compression 04/02/2018; since we last saw this woman about a month ago she had a fall apparently suffered a pelvic fracture. I did not look up the x-ray.  Nevertheless because of pain she literally was bedbound for 2 weeks and had home health coming out to change the dressing. Somewhat predictably this is resulted in considerable improvement in both wound areas. The right is just about closed on the medial malleolus and the left is about half the size. 04/16/2018; both  her wounds continue to go down in size. Using Hydrofera Blue. 05/07/18; both her wounds appeared to be improving especially on the right where it is almost closed. We are using Hydrofera Blue 05/14/2018; slightly worse this week with larger wounds. Surface on the left medial not quite as good. We have been using Hydrofera Blue 05/21/18; again the wounds are slightly larger. Left medial malleolus slightly larger with eschar around the circumference. We have been using Hydrofera Blue undergoing a wraps for a prolonged period of time. This got a lot better when she was more recumbent due to a fall and a back injury. I change the primary dressing the silver alginate today. She did not tolerate a 4 layer compression previously although I may need to bring this up with her next time 05/28/2018; area on the left medial malleolus again is slightly larger with more drainage. Area on the right is roughly unchanged. She has a small area of folliculitis on the right medial just on the lower calf. This does not look ominous. 06/03/2018 left medial malleolus slightly smaller in a better looking surface. We used silver nitrate on this last time with silver alginate. The area on the right appears slightly smaller 1/10; left medial malleolus slightly smaller. Small open area on the right. We used silver nitrate and silver alginate as of 2 weeks ago. We continue with the wound and compression. These got a lot better when she was off her feet 1/17; right medial malleolus wound is smaller. The left may be slightly smaller. Both surfaces look somewhat better. 1/24; both wounds are slightly smaller. Using silver  alginate under Unna boots 1/31; both wounds appear smaller in fact the area on the right medial is just about closed. Surface eschar. We have been using silver alginate under Unna boots. The patient is less active now spends let much less time on her feet and I think this is contributed to the general improvement in the wound condition 2/7; both wounds appear smaller. I was hopeful the right medial would be closed however there there is still the same small open area. Slight amount of surface eschar on the left the dimensions are smaller there is eschar but the wound edges appear to be free. We have been using silver alginate under Unna boot's 2/14; both wounds once again measure smaller. Circumferential eschar on the left medial. We have been using silver alginate under Unna boots with gradual improvement 2/21; the area on the right medial malleolus has healed. The area on the left is smaller. We have been using silver alginate and Unna boots. We can discharge wrapping the right leg she has 20/30 stockings at home she will need to protect the scar tissue in this area 2/28; the area on the right medial malleolus remains closed the patient has a compression stocking. The area on the left is smaller. We have been using silver alginate and Unna boots. 3/6 the area on the right medial ankle remains closed. Good edema control noted she is using her own compression stocking. The area on the left medial ankle is smaller. We have been managing this with silver alginate and Unna boots which we will continue today. 3/13; the area on the right medial ankle remains closed and I'm declaring it healed today. When necessary the left is about the same still a healthy-looking surface but no major change and wound area. No evidence of infection and using silver alginate under unna and generally making considerable improvement 3/27 the area on  the right medial ankle remains closed the area on the left is about the same  as last week. Certainly not any worse we have been using silver alginate under an Unna boot 4/3; the area on the right medial ankle remains closed per the patient. We did not look at this wound. The wound on the left medial ankle is about the same surface looks healthy we have been using silver alginate under an Unna boot 4/10; area on the right medial ankle remains closed per the patient. We did not look at this wound. The wound on the left medial ankle is slightly larger. The patient complains that the [facility] caused burning pain all week. She also told us that she was a lot more active this week. Changed her back to silver alginate 4/17; right medial ankle still closed per the patient. Left medial ankle is slightly larger. Using silver alginate. She did not tolerate Hydrofera Blue on this area 4/24; right medial ankle remains closed we have not look at this. The left medial ankle continues to get larger today by about a centimeter. We have been using silver alginate under Unna boots. She complains about 4 layer compression as an alternative. She has been up on her feet working on her garden 5/8; right medial ankle remains closed we did not look at this. The left medial ankle has increased in size about 100%. We have been using silver alginate under Unna boots. She noted increased pain this week and was not surprised that the wound is deteriorated 5/15; no major change in SA however much less erythema ( one week of doxy ocellulitis). 5/22-65 year old female returns at 1 week to the clinic for left medial ankle wound for which we have been using silver alginate under 3 layer compression She was placed on DOXY at last visit - the wound is wider at this visit. She is in 3 layer compression 5/29; change to [facility] last week. I had given her empiric doxycycline 2 weeks ago for a week. She is in 3 layer compression. She complains of a lot of pain and drainage on presentation today. 6/5;  using Hydrofera Blue. I gave her doxycycline recently empirically for erythema and pain around the wound. Believe her cultures showed enterococcus which not would not have been well covered by doxycycline nevertheless the wound looks better and I don't feel specifically that the enterococcus needs to be covered. She has a new what looks like a wrap injury on her lateral left ankle. 6/12; she is using Hydrofera Blue. She has a new area on the left anterior lower tibial area. This was a wrap injury last week. 6/19; the patient is using Hydrofera Blue. She arrived with marked inflammation and erythema around the wound and tenderness. 12/01/18 on evaluation today patient appears to be doing a little bit better based on what I'm hearing from the standpoint of lassos evaluation to this as far as the overall appearance of the wound is concerned. Then sometime substandard she typically sees Dr. Dellia Nims. Nonetheless overall very pleased with the progress that she's made up to this point. No fevers, chills, nausea, or vomiting noted at this time. 7/10; some improvement in the surface area. Aggressively debrided last week apparently. I went ahead with the debridement today although the patient does not tolerate this very well. We have been using Iodoflex. Still a fair amount of drainage 7/17; slightly smaller. Using Iodoflex. 7/24; no change from last week in terms of surface area. We have  been using Iodoflex. Surface looks and continues to look somewhat better 7/31; surface area slightly smaller better looking surface. We have been using Iodoflex. This is under Unna boot compression 8/7-Patient presents at 1 week with Unna boot and Iodoflex, wound appears better 8/14-Patient presents at 1 week with Iodoflex, we use the Unna boot, wound appears to be stable better.Patient is getting Botox treatment for the inversion of the foot for tendon release, Next week 8/21; we are using Iodoflex. Unna boot. The wound is  stable in terms of surface area. Under illumination there is some areas of the wound that appear to be either epithelialized or perhaps this is adherent slough at this point I was not really clear. It did not wipe off and I was reluctant to debride this today. 8/28; we are using Iodoflex in an Unna boot. Seems to be making good improvement. 9/4; using Iodoflex and wound is slightly smaller. 9/18; we are using Iodoflex with topical silver nitrate when she is here. The wound continues to be smaller 10/2; patient missed her appointment last week due to GI issues. She left and Iodoflex based dressing on for 2 weeks. Wound is about the same size about the size of a dime on the left medial lower 10/9 we have been using Iodoflex on the medial left ankle wound. She has a new superficial probable wrap injury on the dorsal left ankle 10/16; we have been using Hydrofera Blue since last week. This is on the left medial ankle 10/23; we have been using Hydrofera Blue since 2 weeks ago. This is on the left medial ankle. Dimensions are better 11/6; using Hydrofera Blue. I think the wound is smaller but still not closed. Left medial ankle 11/13; we have been using Hydrofera Blue. Wound is certainly no smaller this week. Also the surface not as good. This is the remanent of a very large area on her left medial ankle. 11/20; using Sorbact since last week. Wound was about the same in terms of size although I was disappointed about the surface debris 12/11; 3-week follow-up. Patient was on vacation. Wound is measuring slightly larger we have been using Sorbact. 12/18; wound is about the same size however surface looks better last week after debridement. We have been using Sorbact under compression 1/15 wound is probably twice the size of last time increased in length nonviable surface. We have been using Sorbact. She was running a mild fever and missed her appointment last week 1/22; the wound is come down in size but  under illumination still a very adherent debris we have been Hydrofera Blue that I changed her to last week 1/29; dimensions down slightly. We have been using Hydrofera Blue 2/19 dimensions are the same however there is rims of epithelialization under illumination. Therefore more the surface area may be epithelialized 2/26; the patient's wound actually measures smaller. The wound looks healthy. We have been using Hydrofera Blue. I had some thoughts about running Apligraf then I still may do that however this looks so much better this week we will delay that for now 3/5; the wound is small but about the same as last week. We have been using Hydrofera Blue. No debridement is required today. 3/19; the wound is about the size of a dime. Healthy looking wound even under illumination. We have been using Hydrofera Blue. No mechanical debridement is necessary 3/26; not much change from last week although still looks very healthy. We have been using Hydrofera Blue under Unna boots Patient was  offered an ankle fusion by podiatry but not until the wound heals with a proceed with this. 4/9; the patient comes in today with her original wound on the medial ankle looking satisfactory however she has some uncontrolled swelling in the middle part of her leg with 2 new open areas superiorly just lateral to the tibia. I think this was probably a wrap issue. She said she felt uncomfortable during the week but did not call in. We have been using Hydrofera Blue 4/16; the wound on the medial ankle is about the same. She has innumerable small areas superior to this across her mid tibia. I think this is probably folliculitis. She is also been working in the yard doing a lot of sweating 4/30; the patient issue on the upper areas across her mid tibia of all healed. I think this was excessive yard work if I remember. Her wound on the medial ankle is smaller. Some debris on this we have been using Hydrofera Blue under Unna  boots 5/7; mid tibia. She has been using Hydrofera Blue under an Unna wrap. She is apparently going for her ankle surgery on June 3 10/28/19-Patient returns to clinic with the ankle wound, we are using Hydrofera Blue under Unna wrap, surgery is scheduled for her left foot for June 3 so she will be back for nurse visit next week READMISSION 01/17/2020 Mrs. Proto is a 65 year old woman we have had in this clinic for a long period of time with severe venous hypertension and refractory wounds on her medial lower legs and ankles bilaterally. 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 When she was off her legs for a prolonged period example when she fell and suffered a compression fracture things would heal up quite nicely. She is now retired and we managed to heal up the right medial leg wound. The left one was very tiny last time I saw this although still refractory. 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 It got to the point where this was interfering with her walking and she elected to proceed with a ankle arthrodesis to straighten her her ankle and leave her with a functional outcome for mobilization. The patient was referred to Dr. Doren Custard and really this took some time to arrange. Dr. Doren Custard saw her on 12/07/2019. Once again he verified that she had no arterial issues. She had previously had an angiogram several years ago. Follow-up ABIs on the left showed an ABI of 1.12 with triphasic waveforms and a TBI of 0.92. She is felt to have chronic deep venous insufficiency but I do not think it was felt that anything could be done from about this from an ablation point of view. At the time Dr. Doren Custard saw this patient the wounds actually look closed via the pictures in his clinic. The patient finally  underwent her surgery on 12/15/2019. This went reasonably well and there was a good anatomic outcome. She developed a small distal wound dehiscence on the lateral part of the surgical wound. However more problematically she is developed recurrence of the wound on the medial left ankle. There are actually 2 wounds here one in the distal lower leg and 1 pretty much at the level of the medial malleolus. It is a more distal area that is more problematic.  She has been using Hydrofera Blue which started on Friday before this she was simply Ace wrapping. There was a culture done that showed Pseudomonas and she is on ciprofloxacin. A recent CNS on 8/11 was negative. The patient reports some pain but I generally think this is improving. She is using a cam boot completely nonweightbearing using a walker for pivot transfers and a wheelchair 8/24; not much improvement unfortunately she has a surgical wound on the lateral part in the venous insufficiency wound medially. The bottom part of the medial insufficiency wound is still necrotic there is exposed tendon here. We have been using Hydrofera Blue under compression. Her edema control is however better 8/31; patient in for follow-up of his surgical wound on the lateral part of her left leg and chronic venous insufficiency ulcers medially. We put her back in compression last week. She comes in today with a complaint of 3 or 4 days worth of increasing pain. She felt her cam walker was rubbing on the area on the back of her heel. However there is intense erythema seems more likely she has cellulitis. She had 2 cultures done when she was seeing podiatry in the postop. One of them in late July showed Pseudomonas and she received a course of ciprofloxacin the other was negative on 8/11 she is allergic to penicillin with anaphylactoid complaints of hives oral swelling via information in epic 9/9; when I saw this patient last week she had intense anterior erythema around  her wound on the right lateral heel and ankle and also into the right medial heel. Some of this was no doubt drainage and her walker boot however I was convinced she had cellulitis. I gave her Levaquin and Bactrim she is finishing up on this now. She is following up with Dr. [name] he saw her yesterday. He is taken her out of the walking boot of course she is still nonweightbearing. Her x-ray was negative for any worrisome features such as soft tissue air etc. Things are a lot better this week. She has home health. We have been using Hydrofera Blue under an [employer] which she put back on yesterday. I did not wrap her last week 9/17; her surrounding skin looks a lot better. In fact the area on the left lateral ankle has just a scant amount of eschar. The only remaining wound is the large area on the left medial ankle. Probably about 60% of this is healthy granulation at the surface however she has a significant divot distally. This has adherent debris in it. I been using debridement and silver collagen to try and get this area to fill-in although I do not think we have made much progress this week 9/24; the patient's wound on the left medial ankle looks a lot better. The deeper divot area distally still requires debridement but this is cleaning up quite nicely we have been using silver collagen. The patient is complaining of swelling in her foot and is worried that that is contributing to the nonhealing of the ankle wound. She is also complaining of numbness in her anterior toes 10/4; left medial ankle. The small area distally still has a divot with necrotic material that I have been debriding away. This has an undermining area. She is approved for Apligraf. She saw Dr. [name] her surgeon on 10/1. I think he declared himself is satisfied with the condition of things. Still nonweightbearing till the end of the month. We are dealing with the venous insufficiency wounds on the  medial ankle. Her surgical  wound is well closed. There is no evidence of infection 10/11; the patient arrived in clinic today with the expectation that we be able to put an Apligraf on this area after debridement however she arrives with a relatively large amount of green drainage on the dressing. The patient states that this started on Friday. She has not been systemically unwell. 10/19; culture I did last week showed both Enterococcus and Pseudomonas. I think this came in separate parts because I stopped her ciprofloxacin I gave her and prescribed her linezolid however now looking at the final culture result this is Pseudomonas which is resistant to quinolones. She has not yet picked up the linezolid apparently phone issues. We are also trying to get a topical antibiotic out of [place] in [place] they can be applied by home health. She is still having green drainage 10/16; the patient has her topical antibiotic from [facility] in [place]. This is a compounded gel with vancomycin and ciprofloxacin and gentamicin. We are applying this on the wound bed with silver alginate over the top with Unna boot wraps. She arrives in clinic today with a lot less ominous looking drainage although she is only use this topical preparation once the second time today. She sees Dr. [name] her surgeon on Friday she has home health changing the dressing 11/2; still using her compounded topical antibiotic under silver alginate. Surface is cleaning up there is less drainage. We had an Apligraf for her today and I elected to apply it. A light coating of her antibiotic 04/25/2020 upon evaluation today patient appears to be doing well with regard to her ankle ulcer. There is a little bit of slough noted on the surface of the wound I am can have to perform sharp debridement to clear this away today. With that being said other than that fact overall I feel like she is making progress and we do see some new epithelial growth. There is also  some improvement in the depth of the wound and that distal portion. There is little bit of slough there as well. 12/7; 2-week follow-up. Apligraf #3. Dimensions are smaller. Closing in especially inferiorly. Still some surface debris. Still using the [facility] topical antibiotic but I told her that I don't think this needs to be renewed 12/21; 2-week follow-up. Apligraf #4 dimensions are smaller. Nice improvement 06/05/2020; 2-week follow-up. The patient's wound on the left medial ankle looks really excellent. Nice granulation. Advancing epithelialization no undermining no evidence of infection. We would have to reapply for another Apligraf but with the condition of this wound I did not feel strongly about it. We used Hydrofera Blue under the same degree of compression. She follows up with Dr. [name] her surgeon a week Friday 06/13/2020 upon evaluation today patient appears to be doing excellent in regard to her wound. She has been tolerating the dressing changes without complication. Fortunately there is no signs of active infection at this time. No fevers, chills, nausea, vomiting, or diarrhea. She was using Hydrofera Blue last week. 06/20/2020 06/20/2020 on evaluation today patient actually appears to be doing excellent in regard to her wound. This is measuring better and looking much better as well. She has been using the collagen that seems to be doing better for her as well even though the [facility] was and is not sticking or feeling as rough on her wound. She did see Dr. [name] on Friday he is very pleased he also stated none of the hardware  has shifted. That is great news 1/27; the patient has a small clean wound all that is remaining. I agree that this is too small to really consider an Apligraf. Under illumination the surface is looking quite good. We have been using collagen although the dimensions are not any better this week Objective Constitutional Sitting or standing Blood Pressure is  within target range for patient.. Pulse regular and within target range for patient.Marland Kitchen Respirations regular, non-labored and within target range.. Temperature is normal and within the target range for the patient.Marland Kitchen Appears in no distress. Vitals Time Taken: 10:02 AM, Height: 68 in, Temperature: 97.7 F, Pulse: 59 bpm, Respiratory Rate: 17 breaths/min, Blood Pressure: 105/53 mmHg. Cardiovascular Needle pulses are palpable. There is no uncontrolled edema. General Notes: Wound exam; no debridement is required. Under illumination the tissue looks quite healthy here. I am hopeful we can move onto final closure of this area Integumentary (Hair, Skin) Wound #15 status is Open. Original cause of wound was Gradually Appeared. The wound is located on the Left,Medial Malleolus. The wound measures 1.6cm length x 0.9cm width x 0.1cm depth; 1.131cm^2 area and 0.113cm^3 volume. There is Fat Layer (Subcutaneous Tissue) exposed. There is no tunneling or undermining noted. There is a medium amount of serosanguineous drainage noted. The wound margin is flat and intact. There is large (67-100%) red granulation within the wound bed. There is no necrotic tissue within the wound bed. Assessment Active Problems ICD-10 Chronic venous hypertension (idiopathic) with ulcer and inflammation of left lower extremity Non-pressure chronic ulcer of other part of left lower leg with other specified severity Non-pressure chronic ulcer of left ankle with other specified severity Procedures Wound #15 Pre-procedure diagnosis of Wound #15 is a Venous Leg Ulcer located on the Left,Medial Malleolus . There was a Haematologist Compression Therapy Procedure by Baruch Gouty, RN. Post procedure Diagnosis Wound #15: Same as Pre-Procedure Plan Follow-up Appointments: Return Appointment in 1 week. Bathing/ Shower/ Hygiene: May shower with protection but do not get wound dressing(s) wet. Edema Control - Lymphedema / SCD / Other: Elevate  legs to the level of the heart or above for 30 minutes daily and/or when sitting, a frequency of: Avoid standing for long periods of time. Exercise regularly Compression stocking or Garment 20-30 mm/Hg pressure to: - right leg daily WOUND #15: - Malleolus Wound Laterality: Left, Medial Cleanser: Soap and Water 1 x Per Week/15 Days Discharge Instructions: May shower and wash wound with dial antibacterial soap and water prior to dressing change. Peri-Wound Care: Triamcinolone 15 (g) 1 x Per Week/15 Days Discharge Instructions: Use triamcinolone 15 (g) as directed Peri-Wound Care: Sween Lotion (Moisturizing lotion) 1 x Per Week/15 Days Discharge Instructions: Apply moisturizing lotion as directed Prim Dressing: PolyMem Silver Non-Adhesive Dressing, 4.25x4.25 in 1 x Per Week/15 Days ary Discharge Instructions: Apply to wound bed as instructed Secondary Dressing: Woven Gauze Sponge, Non-Sterile 4x4 in (Home Health) 1 x Per Week/15 Days Discharge Instructions: Apply over primary dressing as directed. Secondary Dressing: ABD Pad, 5x9 (Home Health) 1 x Per Week/15 Days Discharge Instructions: Apply over primary dressing as directed. Com pression Wrap: Unnaboot w/Calamine, 4x10 (in/yd) (Home Health) 1 x Per Week/15 Days Discharge Instructions: Apply Unnaboot as directed. #1 I change the primary dressing to polymen silver hopefully to promote some additional epithelialization. 2. It seemed that 2 weeks of collagen had not really helped although the wound surface sent looks a lot better Electronic Signature(s) Signed: 06/28/2020 4:56:03 PM By: Linton Ham MD Entered By: Linton Ham  on 06/28/2020 11:14:38 -------------------------------------------------------------------------------- SuperBill Details Patient Name: Date of Service: [facility], [name] 06/28/2020 Medical Record Number: [id number] Patient Account Number: [id number] Date of Birth/Sex: Treating RN: [birth year] (65 y.o. [name], [name] Primary Care Provider: [name] Other Clinician: Referring Provider: Treating Provider/Extender: [name] in Treatment: 23 Diagnosis Coding ICD-10 Codes Code Description [phone] Chronic venous hypertension (idiopathic) with ulcer and inflammation of left lower extremity L97.828 Non-pressure chronic ulcer of other part of left lower leg with other specified severity L97.328 Non-pressure chronic ulcer of left ankle with other specified severity Facility Procedures CPT4 Code: [id number] Description: (Facility Use Only) [id number] - APPLY UNNA BOOT LT Modifier: Quantity: 1 Physician Procedures : CPT4 Code Description Modifier [id number] - WC PHYS LEVEL 3 - EST PT ICD-10 Diagnosis Description I87.332 Chronic venous hypertension (idiopathic) with ulcer and inflammation of left lower extremity L97.828 Non-pressure chronic ulcer of other part  of left lower leg with other specified severity L97.328 Non-pressure chronic ulcer of left ankle with other specified severity Quantity: 1 Electronic Signature(s) Signed: 06/28/2020 4:56:03 PM By: Linton Ham MD Entered By: Linton Ham on 06/28/2020 11:15:00

## 2020-06-28 NOTE — Progress Notes (Signed)
LAKEISA, HENINGER ([id number]) Visit Report for 06/28/2020 Arrival Information Details Patient Name: Date of Service: [facility] [name], [name] 06/28/2020 9:45 A M Medical Record Number: [id number] Patient Account Number: [id number] Date of Birth/Sex: Treating RN: 11/04/1955 (65 y.o. [name], [name] Primary Care Sianni Cloninger: [name] Other Clinician: Referring Carley Glendenning: Treating Janaye Corp/Extender: [name] in Treatment: 23 Visit Information History Since Last Visit Added or deleted any medications: No Patient Arrived: Cane Any new allergies or adverse reactions: No Arrival Time: 10:01 Had a fall or experienced change in No Accompanied By: self activities of daily living that may affect Transfer Assistance: None risk of falls: Patient Identification Verified: Yes Signs or symptoms of abuse/neglect since last visito No Secondary Verification Process Completed: Yes Hospitalized since last visit: No Patient Requires Transmission-Based Precautions: No Implantable device outside of the clinic excluding No Patient Has Alerts: Yes cellular tissue based products placed in the center Patient Alerts: L ABI =1.12, TBI = .92 since last visit: R ABI= 1.02, TBI= .58 Has Dressing in Place as Prescribed: Yes Pain Present Now: Yes Electronic Signature(s) Signed: 06/28/2020 3:17:01 PM By: [name] Entered By: [name] on 06/28/2020 10:01:34 -------------------------------------------------------------------------------- Compression Therapy Details Patient Name: Date of Service: [name], [name] 06/28/2020 9:45 A M Medical Record Number: [id number] Patient Account Number: [id number] Date of Birth/Sex: Treating RN: 03/18/1956 (65 y.o. [name] Primary Care Ravinder Lukehart: [name] Other Clinician: Referring Akanksha Bellmore: Treating Emmy Keng/Extender: [name] in Treatment: 23 Compression Therapy Performed for Wound  Assessment: Wound #15 Left,Medial Malleolus Performed By: Clinician Baruch Gouty, RN Compression Type: Rolena Infante Post Procedure Diagnosis Same as Pre-procedure Electronic Signature(s) Signed: 06/28/2020 5:42:01 PM By: Deon Pilling Entered By: Deon Pilling on 06/28/2020 10:39:27 -------------------------------------------------------------------------------- Encounter Discharge Information Details Patient Name: Date of Service: [facility] [name], [name] 06/28/2020 9:45 A M Medical Record Number: [id number] Patient Account Number: [id number] Date of Birth/Sex: Treating RN: 04/08/1956 (65 y.o. [name] Deira Shimer: [name] Other Clinician: Referring Irmgard Rampersaud: Treating Legacy Lacivita/Extender: [name] in Treatment: 23 Encounter Discharge Information Items Discharge Condition: Stable Ambulatory Status: Cane Discharge Destination: Home Transportation: Private Auto Accompanied By: self Schedule Follow-up Appointment: Yes Clinical Summary of Care: Patient Declined Electronic Signature(s) Signed: 06/28/2020 5:45:53 PM By: Baruch Gouty RN, BSN Entered By: Baruch Gouty on 06/28/2020 10:53:19 -------------------------------------------------------------------------------- Lower Extremity Assessment Details Patient Name: Date of Service: [facility] [name], [name] 06/28/2020 9:45 A M Medical Record Number: [id number] Patient Account Number: [id number] Date of Birth/Sex: Treating RN: [birth year] (65 y.o. [name] Primary Care Derreck Wiltsey: [name] Other Clinician: Referring Deaisa Merida: Treating Lessie Funderburke/Extender: [name] in Treatment: 23 Edema Assessment Assessed: [Left: No] [Right: No] Edema: [Left: N] [Right: o] Calf Left: Right: Point of Measurement: From Medial Instep 28 cm Ankle Left: Right: Point of Measurement: From Medial Instep 20.5 cm Vascular Assessment Pulses: Dorsalis  Pedis Palpable: [Left:Yes] Electronic Signature(s) Signed: 06/28/2020 5:48:06 PM By: Levan Hurst RN, BSN Entered By: Levan Hurst on 06/28/2020 10:15:43 -------------------------------------------------------------------------------- Multi Wound Chart Details Patient Name: Date of Service: [name], [name] 06/28/2020 9:45 A M Medical Record Number: [id number] Patient Account Number: [id number] Date of Birth/Sex: Treating RN: [birth year] (65 y.o. [name] Primary Care Montavious Wierzba: [name] Other Clinician: Referring Shatona Andujar: Treating Torii Royse/Extender: [name] in Treatment: 23 Vital Signs Height(in): 68 Pulse(bpm): 59 Weight(lbs): Blood Pressure(mmHg): 105/53 Body Mass Index(BMI): Temperature(F): 97.7  Respiratory Rate(breaths/min): 17 Photos: [15:No Photos Left, Medial Malleolus] [N/A:N/A N/A] Wound Location: [15:Gradually Appeared] [N/A:N/A] Wounding Event: [15:Venous Leg Ulcer] [N/A:N/A] Primary Etiology: [15:Peripheral Venous Disease,] [N/A:N/A] Comorbid History: [15:Osteoarthritis, Confinement Anxiety 12/30/2019] [N/A:N/A] Date Acquired: [15:23] [N/A:N/A] Weeks of Treatment: [15:Open] [N/A:N/A] Wound Status: [15:1.6x0.9x0.1] [N/A:N/A] Measurements L x W x D (cm) [15:1.131] [N/A:N/A] A (cm) : rea [15:0.113] [N/A:N/A] Volume (cm) : [15:90.30%] [N/A:N/A] % Reduction in Area: [15:98.80%] [N/A:N/A] % Reduction in Volume: [15:Full Thickness With Exposed Support N/A] Classification: [15:Structures Medium] [N/A:N/A] Exudate Amount: [15:Serosanguineous] [N/A:N/A] Exudate Type: [15:red, brown] [N/A:N/A] Exudate Color: [15:Flat and Intact] [N/A:N/A] Wound Margin: [15:Large (67-100%)] [N/A:N/A] Granulation Amount: [15:Red] [N/A:N/A] Granulation Quality: [15:None Present (0%)] [N/A:N/A] Necrotic Amount: [15:Fat Layer (Subcutaneous Tissue): Yes N/A] Exposed Structures: [15:Fascia: No Tendon: No Muscle: No Joint: No Bone: No  Medium (34-66%)] [N/A:N/A] Epithelialization: [15:Compression Therapy] [N/A:N/A] Treatment Notes Wound #15 (Malleolus) Wound Laterality: Left, Medial Cleanser Soap and Water Discharge Instruction: May shower and wash wound with dial antibacterial soap and water prior to dressing change. Peri-Wound Care Triamcinolone 15 (g) Discharge Instruction: Use triamcinolone 15 (g) as directed Sween Lotion (Moisturizing lotion) Discharge Instruction: Apply moisturizing lotion as directed Topical Primary Dressing PolyMem Silver Non-Adhesive Dressing, 4.25x4.25 in Discharge Instruction: Apply to wound bed as instructed Secondary Dressing Woven Gauze Sponge, Non-Sterile 4x4 in Discharge Instruction: Apply over primary dressing as directed. ABD Pad, 5x9 Discharge Instruction: Apply over primary dressing as directed. Secured With Compression Wrap Unnaboot w/Calamine, 4x10 (in/yd) Discharge Instruction: Apply Unnaboot as directed. Compression Stockings Add-Ons Electronic Signature(s) Signed: 06/28/2020 4:56:03 PM By: Linton Ham MD Signed: 06/28/2020 5:42:01 PM By: Deon Pilling Entered By: Linton Ham on 06/28/2020 11:09:45 -------------------------------------------------------------------------------- Multi-Disciplinary Care Plan Details Patient Name: Date of Service: [facility] [name], [name] 06/28/2020 9:45 A M Medical Record Number: [id number] Patient Account Number: [id number] Date of Birth/Sex: Treating RN: [birth year] (65 y.o. [name] Primary Care Jatniel Verastegui: [name] Other Clinician: Referring Shary Lamos: Treating Virginia Curl/Extender: [name] in Treatment: 23 Active Inactive Venous Leg Ulcer Nursing Diagnoses: Actual venous Insuffiency (use after diagnosis is confirmed) Knowledge deficit related to disease process and management Goals: Patient will maintain optimal edema control Date Initiated: 06/20/2020 Target Resolution Date:  07/18/2020 Goal Status: Active Interventions: Assess peripheral edema status every visit. Compression as ordered Treatment Activities: Therapeutic compression applied : 06/20/2020 Notes: Wound/Skin Impairment Nursing Diagnoses: Impaired tissue integrity Knowledge deficit related to ulceration/compromised skin integrity Goals: Patient/caregiver will verbalize understanding of skin care regimen Date Initiated: 01/17/2020 Target Resolution Date: 07/11/2020 Goal Status: Active Ulcer/skin breakdown will have a volume reduction of 30% by week 4 Date Initiated: 01/17/2020 Date Inactivated: 03/12/2020 Target Resolution Date: 03/09/2020 Goal Status: Met Interventions: Assess patient/caregiver ability to obtain necessary supplies Assess patient/caregiver ability to perform ulcer/skin care regimen upon admission and as needed Assess ulceration(s) every visit Provide education on ulcer and skin care Notes: Electronic Signature(s) Signed: 06/28/2020 5:42:01 PM By: Deon Pilling Entered By: Deon Pilling on 06/28/2020 10:30:54 -------------------------------------------------------------------------------- Pain Assessment Details Patient Name: Date of Service: [facility] [name], [name] 06/28/2020 9:45 A M Medical Record Number: [id number] Patient Account Number: [id number] Date of Birth/Sex: Treating RN: July 24, 1955 (65 y.o. [name] Primary Care Derrico Zhong: [name] Other Clinician: Referring Jeilyn Reznik: Treating Marinda Tyer/Extender: [name] in Treatment: 23 Active Problems Location of Pain Severity and Description of Pain Patient Has Paino Yes Site Locations Rate the pain. Current Pain Level: 3 Pain Management and Medication Current Pain Management: Electronic Signature(s) Signed: 06/28/2020  3:17:01 PM By: [name] Signed: 06/28/2020 5:42:01 PM By: Deon Pilling Entered By: [name] on 06/28/2020  10:04:48 -------------------------------------------------------------------------------- Patient/Caregiver Education Details Patient Name: Date of Service: [name], [name] 1/27/2022andnbsp9:45 A M Medical Record Number: [id number] Patient Account Number: [id number] Date of Birth/Gender: Treating RN: 02/25/1956 (65 y.o. [name] Primary Care Physician: [name] Other Clinician: Referring Physician: Treating Physician/Extender: [name] in Treatment: 23 Education Assessment Education Provided To: Patient Education Topics Provided Wound/Skin Impairment: Handouts: Skin Care Do's and Dont's Methods: Explain/Verbal Responses: Reinforcements needed Electronic Signature(s) Signed: 06/28/2020 5:42:01 PM By: Deon Pilling Entered By: Deon Pilling on 06/28/2020 10:31:09 -------------------------------------------------------------------------------- Wound Assessment Details Patient Name: Date of Service: [facility] [name], [name] 06/28/2020 9:45 A M Medical Record Number: [id number] Patient Account Number: [id number] Date of Birth/Sex: Treating RN: 05/12/1956 (65 y.o. [name], [name] Primary Care Janai Brannigan: [name] Other Clinician: Referring Tarrie Mcmichen: Treating Keylah Darwish/Extender: [name] in Treatment: 23 Wound Status Wound Number: 15 Primary Venous Leg Ulcer Etiology: Wound Location: Left, Medial Malleolus Wound Status: Open Wounding Event: Gradually Appeared Comorbid Peripheral Venous Disease, Osteoarthritis, Confinement Date Acquired: 12/30/2019 History: Anxiety Weeks Of Treatment: 23 Clustered Wound: No Wound Measurements Length: (cm) 1.6 Width: (cm) 0.9 Depth: (cm) 0.1 Area: (cm) 1.131 Volume: (cm) 0.113 % Reduction in Area: 90.3% % Reduction in Volume: 98.8% Epithelialization: Medium (34-66%) Tunneling: No Undermining: No Wound Description Classification: Full Thickness With Exposed  Support Structures Wound Margin: Flat and Intact Exudate Amount: Medium Exudate Type: Serosanguineous Exudate Color: red, brown Foul Odor After Cleansing: No Slough/Fibrino No Wound Bed Granulation Amount: Large (67-100%) Exposed Structure Granulation Quality: Red Fascia Exposed: No Necrotic Amount: None Present (0%) Fat Layer (Subcutaneous Tissue) Exposed: Yes Tendon Exposed: No Muscle Exposed: No Joint Exposed: No Bone Exposed: No Treatment Notes Wound #15 (Malleolus) Wound Laterality: Left, Medial Cleanser Soap and Water Discharge Instruction: May shower and wash wound with dial antibacterial soap and water prior to dressing change. Peri-Wound Care Triamcinolone 15 (g) Discharge Instruction: Use triamcinolone 15 (g) as directed Sween Lotion (Moisturizing lotion) Discharge Instruction: Apply moisturizing lotion as directed Topical Primary Dressing PolyMem Silver Non-Adhesive Dressing, 4.25x4.25 in Discharge Instruction: Apply to wound bed as instructed Secondary Dressing Woven Gauze Sponge, Non-Sterile 4x4 in Discharge Instruction: Apply over primary dressing as directed. ABD Pad, 5x9 Discharge Instruction: Apply over primary dressing as directed. Secured With Compression Wrap Unnaboot w/Calamine, 4x10 (in/yd) Discharge Instruction: Apply Unnaboot as directed. Compression Stockings Add-Ons Electronic Signature(s) Signed: 06/28/2020 5:42:01 PM By: Deon Pilling Signed: 06/28/2020 5:48:06 PM By: Levan Hurst RN, BSN Entered By: Levan Hurst on 06/28/2020 10:16:05 -------------------------------------------------------------------------------- Vitals Details Patient Name: Date of Service: [name], [name] 06/28/2020 9:45 A M Medical Record Number: [id number] Patient Account Number: [id number] Date of Birth/Sex: Treating RN: [birth year] (65 y.o. [name], Meta.Reding Primary Care Sudie Bandel: [name] Other Clinician: Referring Arieal Cuoco: Treating  Linzee Depaul/Extender: [name] in Treatment: 23 Vital Signs Time Taken: 10:02 Temperature (F): 97.7 Height (in): 68 Pulse (bpm): 59 Respiratory Rate (breaths/min): 17 Blood Pressure (mmHg): 105/53 Reference Range: 80 - 120 mg / dl Electronic Signature(s) Signed: 06/28/2020 3:17:01 PM By: [name] Entered By: [name] on 06/28/2020 10:04:42

## 2020-06-28 NOTE — Telephone Encounter (Signed)
Rx sent 

## 2020-06-29 NOTE — Telephone Encounter (Signed)
Completed, informed patient about prescription

## 2020-07-02 NOTE — Progress Notes (Signed)
   Subjective:  Patient presents today status post tibiotalar calcaneal arthrodesis left lower extremity. DOS: 12/15/2019.  Patient continues to be treated at the [facility] weekly for wound care to the medial aspect of the ankle.  Patient continues to see improvement and she has been weightbearing in the cam boot daily.  Past Medical History:  Diagnosis Date  . Allergy   . Ankle wound 02/2016   bilateral  . Dental bridge present    lower  . Dental crowns present    upper right  . Open wounds involving multiple regions of lower extremity   . Peripheral vascular disease (HCC)    venous stasis ulcers bilateral lower extremities  . PONV (postoperative nausea and vomiting)    Objective/Physical Exam Neurovascular status intact.  Otherwise the foot is in a rectus position with good alignment of the foot in relationship to the leg.  The foot is in an almost 90 degree angle.  There is some palpable orthopedic hardware noted to the lateral aspect of the heel bone.  Mild sensitivity to the area.  No open wounds noted.  Radiographic exam: Orthopedic hardware appears intact and stable.  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  Patient has now over 3 months postoperative and radiographically appears stable and healing well.  No significant change since last x-rays  Assessment: 1. s/p tibiotalar calcaneal arthrodesis left. DOS: 12/15/2019 2.  Postsurgical edema left-mostly resolved 3.  Ulcer right medial ankle secondary to venous insufficiency 4.  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 Patient was evaluated.  2.  Continue management of wound ulcers to the medial aspect of the ankle at the wound care center. 3.  Patient may now begin to transition out of the cam boot into good supportive sneakers and shoes 4.  Patient may begin home physical therapy exercises.  Simple walking exercises would be sufficient. 5.  Return to clinic  in 6 weeks  Edrick Kins, DPM [facility]  Dr. Edrick Kins, DPM    [address], [place] [address]                Office [phone]  Fax [phone]

## 2020-07-05 ENCOUNTER — Encounter (HOSPITAL_BASED_OUTPATIENT_CLINIC_OR_DEPARTMENT_OTHER): Payer: Medicare Other | Attending: Internal Medicine | Admitting: Internal Medicine

## 2020-07-05 ENCOUNTER — Other Ambulatory Visit: Payer: Self-pay

## 2020-07-05 DIAGNOSIS — L97328 Non-pressure chronic ulcer of left ankle with other specified severity: Secondary | ICD-10-CM | POA: Diagnosis not present

## 2020-07-05 DIAGNOSIS — I87332 Chronic venous hypertension (idiopathic) with ulcer and inflammation of left lower extremity: Secondary | ICD-10-CM | POA: Diagnosis not present

## 2020-07-05 NOTE — Progress Notes (Signed)
KAELYN, LUTHER ([id number]) Visit Report for 07/05/2020 HPI Details Patient Name: Date of Service: [facility] [name], [name] 07/05/2020 9:30 A M Medical Record Number: [id number] Patient Account Number: [id number] Date of Birth/Sex: Treating RN: Oct 15, 1955 (65 y.o. [name] Primary Care Provider: [name] Other Clinician: Referring Provider: Treating Provider/Extender: [name] in Treatment: 24 History of Present Illness HPI Description: the remaining wound is over the left medial ankle. Similar wound over the right medial ankle healed largely with use of Apligraf. Most recently we have been using Hydrofera Blue over this wound with considerable improvement. The patient has been extensively worked up in the past for her venous insufficiency and she is not a candidate for antireflux surgery although I have none of the details available currently. 08/24/14; considerable improvement today. About 50% of this wound areas now epithelialized. The base of the wound appears to be healthier granulation.as opposed to last week when she had deteriorated a considerable improvement 08/17/14; unfortunately the wound has regressed somewhat. The areas of epithelialization from the superior aspect are not nearly as healthy as they were last week. The patient thinks her Hydrofera Blue slipped. 09/07/14; unfortunately the area has markedly regressed in the 2 weeks since I've seen this. There is an odor surrounding erythema. The healthy granulation tissue that we had at the base of the wound now is a dusky color. The nurse reports green drainage 09/14/14; the area looks somewhat better than last week. There is less erythema and less drainage. The culture I did did not show any growth. Nevertheless I think it is better to continue the Cipro and doxycycline for a further week. The remaining wound area was debridement. 09/21/14. Wound did not require debridement last week. Still less  erythema and less drainage. She can complete her antibiotics. The areas of epithelialization in the superior aspect of the wound do not look as healthy as they did some weeks ago 10/05/14 continued improvement in the condition of this wound. There is advancing epithelialization. Less aggressive debridement required 10/19/14 continued improvement in the condition and volume of this wound. Less aggressive debridement to the inferior part of this to remove surface slough and fibrinous eschar 11/02/14 no debridement is required. The surface granulation appears healthy although some of her islands of epithelialization seem to have regressed. No evidence of infection 11/16/14; lites surface debridement done of surface eschar. The wound does not look to be unhealthy. No evidence of infection. Unfortunately the patient has had podiatry issues in the right foot and for some reason has redeveloped small surface ulcerations in the medial right ankle. Her original presentation involved wounds in this area 11/23/14 no debridement. The area on the right ankle has enlarged. The left ankle wound appears stable in terms of the surface although there is periwound inflammation. There has been regression in the amount of new skin 11/30/14 no debridement. Both wound areas appear healthy. There was no evidence of infection. The the new area on the right medial ankle has enlarged although that both the surfaces appear to be stable. 12/07/14; Debridement of the right medial ankle wound. No no debridement was done on the left. 12/14/14 no major change in and now bilateral medial ankle wounds. Both of these are very painful but the no overt evidence of infection. She has had previous venous ablation 12/21/14; patient states that her right medial ankle wound is considerably more painful last week than usual. Her left is also somewhat painful. She could not  tolerate debridement. The right medial ankle wound has fibrinous surface  eschar 12/28/14 this is a patient with severe bilateral venous insufficiency ulcers. For a considerable period of time we actually had the one on the right medial ankle healed however this recently opened up again in June. The left medial ankle wound has been a refractory area with some absent flows. We had some success with Hydrofera Blue on this area and it literally closed by 50% however it is recently opened up Foley. Both of these were debridement today of surface eschar. She tolerates this poorly 01/25/15: No change in the status of this. Thick adherent escar. Very poor tolerance of any attempt at debridement. I had healed the right medial malleolus wound for a considerable amount of time and had the left one down to about 50% of the volume although this is totally regressed over the last 48 weeks. Further the right leg has reopened. she is trying to make a appointment with pain and vascular, previous ablations with Dr. Aleda Grana. I do not believe there is an arterial insufficiency issue here 02/01/15 the status of the adherent eschar bilaterally is actually improved. No debridement was done. She did not manage to get vascular studies done 02/08/15 continued debridement of the area was done today. The slough is less adherent and comes off with less pressure. There is no surrounding infection peripheral pulses are intact 02/15/15 selective debridement with a disposable curette. Again the slough is less adherent and comes off with less difficulty. No surrounding infection peripheral pulses are intact. 02/22/15 selective debridement of the right medial ankle wound. Slough comes off with less difficulty. No obvious surrounding infection peripheral pulses are intact I did not debridement the one on the left. Both of these are stable to improved 03/01/15 selective debridement of both wound areas using a curette to. Adherent slough cup soft with less difficulty. No obvious surrounding infection.  The patient tells me that 2 days ago she noted a rash above the right leg wrap. She did not have this on her lower legs when she change this over she arrives with widespread left greater than right almost folliculitis-looking rash which is extremely pruritic. I don't see anything to culture here. There is no rash on the rest of her body. She feels well systemically. 03/08/15; selective debridement of both wounds using a curette. Base of this does not look unhealthy. She had limegreen drainage coming out of the left leg wound and describes a lot of drainage. The rash on her left leg looks improved to. No cultures were done. 03/22/15; patient was not here last week. Basal wounds does not look healthy and there is no surrounding erythema. No drainage. There is still a rash on the left leg that almost looks vasculitic however it is clearly limited to the top of where the wrap would be. 04/05/15; on the right required a surgical debridement of surface eschar and necrotic subcutaneous tissue. I did not debridement the area on the left. These continue to be large open wounds that are not changing that much. We were successful at one point in healing the area on the right, and at the same time the area on the left was roughly half the size of current measurements. I think a lot of the deterioration has to do with the prolonged time the patient is on her feet at work 04/19/15 I attempted-like surface debridement bilaterally she does not tolerate this. She tells me that she was in allergic care yesterday with  extreme pain over her left lateral malleolus/ankle and was told that she has an "sprain" 05/03/15; large bilateral venous insufficiency wounds over the medial malleolus/medial aspect of her ankles. She complains of copious amounts of drainage and his usual large amounts of pain. There is some increasing erythema around the wound on the right extending into the medial aspect of her foot to. historically she  came in with these wounds the right one healed and the left one came down to roughly half its current size however the right one is reopened and the left is expanded. 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 05/10/15 large bilateral venous insufficiency wounds. There is less adherence surface left however the surface culture that I did last week grew pseudomonas therefore bilateral selective debridement score necessary. There is surrounding erythema. The patient describes severe bilateral drainage and a lot of pain in the left ankle. Apparently her podiatrist was were ready to do a cortisone shot 05/17/15; the patient complains of pain and again copious amounts of drainage. 05/24/15; we used Iodo flex last week. Patient notes considerable improvement in wound drainage. Only needed to change this once. 05/31/15; we continued Iodoflex; the base of these large wounds bilaterally is not too bad but there is probably likely a significant bioburden here. I would like to debridement just doesn't tolerate it. 06/06/14 I would like to continue the Iodoflex although she still hasn't managed to obtain supplies. She has bilateral medial malleoli or large wounds which are mostly superficial. Both of them are covered circumferentially with some nonviable fibrinous slough although she tolerates debridement very poorly. She apparently has an appointment for an ablation on the right leg by interventional radiology. 06/14/15; the patient arrives with the wounds and static condition. We attempted a debridement although she does not do well with this secondary to pain. I 07/05/15; wounds are not much smaller however there appears to be a cleaner granulating base. The left has tight fibrinous slough greater than the right. Debridement is tolerated poorly due to pain. Iodoflex is done more for these wounds in any of the multitude of different dressings I have tried on the left  1 and then subsequently the right. 07/12/15; no change in the condition of this wound. I am able to do an aggressive debridement on the right but not the left. She simply cannot tolerate it. We have been using Iodoflex which helps somewhat. It is worthwhile remembering that at one point we healed the right medial ankle wound and the left was about 25% of the current circumference. We have suggested returning to vascular surgery for review of possible further ablations for one reason or another she has not been able to do this. 07/26/15 no major change in the condition of either wound on her medial ankle. I did not attempt to debridement of these. She has been aggressively scrubbing these while she is in the shower at home. She has her supply of Iodoflex which seems to have done more for these wounds then anything I have put on recently. 08/09/15 wound area appears larger although not verified by measurements. Using Iodoflex 09/05/2015 -- she was here for avisit today but had significant problems with the wound and I was asked to see her for a physician opinion. I have summarize that this lady has had surgery on her left lower extremity about 10 years ago where the possible veins stripping was done. She has  had an opinion from interventional radiology around November 2016 where no further sclerotherapy was ordered. The patient works 12 hours a day and stands on a concrete floor with work boots and is unable to get the proper compression she requires and cannot elevate her limbs appropriately at any given time. She has recently grown Pseudomonas from her wound culture but has not started her ciprofloxacin which was called in for her. 09/13/15 this continues to be a difficult situation for this patient. At one point I had this wound down to a 1.5 x 1.5" wound on her left leg. This is deteriorated and the right leg has reopened. She now has substantial wounds on her medial calcaneus, malleoli and into her  lower leg. One on the left has surface eschar but these are far too painful for me to debridement here. She has a vascular surgery appointment next week to see if anything can be done to help here. I think she has had previous ablations several years ago at [facility]. She has no major edema. She tells me that she did not get product last time [facility]] and went for several days without it. She continues to work in work boots 12 hours a day. She cannot get compression/4-layer under her work boots. 09/20/15 no major change. Periwound edema control was not very good. Her point with pain and vascular is next Wednesday the 25th 09/28/15; the patient is seen vascular surgery and is apparently scheduled for repeat duplex ultrasounds of her bilateral lower legs next week. 10/05/15; the patient was seen by Dr. Doren Custard of vascular surgery. He feels that she should have arterial insufficiency excluded as cause/contributed to her nonhealing stage she is therefore booked for an arteriogram. She has apparently monophasic signals in the dorsalis pedis pulses. She also of course has known severe chronic venous insufficiency with previous procedures as noted previously. I had another long discussion with the patient today about her continuing to work 12 hour shifts. I've written her out for 2 months area had concerns about this as her work location is currently undergoing significant turmoil and this may lead to her termination. She is aware of this however I agree with her that she simply cannot continue to stand for 12 hours multiple days a week with the substantial wound areas she has. 10/19/15; the Dr. Doren Custard appointment was largely for an arteriogram which was normal. She does not have an arterial issue. He didn't make a comment about her chronic venous insufficiency for which she has had previous ablations. Presumably it was not felt that anything additional could be done. The patient is now out of work as I  prescribed 2 weeks ago. Her wounds look somewhat less aggravated presumably because of this. I felt I would give debridement another try today 10/25/15; no major change in this patient's wounds. We are struggling to get her product that she can afford into her own home through her insurance. 11/01/15; no major change in the patient's wounds. I have been using silver alginate as the most affordable product. I spoke to Dr. Marla Roe last week with her requested take her to the OR for surgical debridement and placement of ACEL. Dr. Marla Roe told me that she would be willing to do this however [facility] will not cover this, fortunately the patient has [place] healthcare of some variant 11/08/15; no major change in the patient's wounds. She has been completely nonviable surface that this but is in too much pain with any  attempted debridement are clinic. I have arranged for her to see Dr. Marla Roe ham of plastic surgery and this appointment is on Monday. I am hopeful that they will take her to the OR for debridement, possible ACEL ultimately possible skin graft 11/22/15 no major change in the patient's wounds over her bilateral medial calcaneus medial malleolus into the lower legs. Surface on these does not look too bad however on the left there is surrounding erythema and tenderness. This may be cellulitis or could him sleepy tinea. 11/29/15; no major changes in the patient's wounds over her bilateral medial malleolus. There is no infection here and I don't think any additional antibiotics are necessary. There is now plan to move forward. She sees Dr. Marla Roe in a week's time for preparation for operative debridement and ACEL placement I believe on 7/12. She then has a follow-up appointment with Dr. Marla Roe on 7/21 12/28/15; the patient returns today having been taken to the [place] by Dr. Marla Roe 12/12/15 she underwent debridement, intraoperative cultures [which were negative]. She had  placement of a wound VAC. Parent really ACEL was not available to be placed. The wound VAC foam apparently adhered to the wound since then she's been using silver alginate, Xeroform under Ace wraps. She still says there is a lot of drainage and a lot of pain 01/31/16; this is a patient I see monthly. I had referred her to Dr. Marla Roe him of plastic surgery for large wounds on her bilateral medial ankles. She has been to the OR twice once in early July and once in early August. She tells me over the last 3 weeks she has been using the wound VAC with ACEL underneath it. On the right we've simply been using silver alginate. Under Kerlix Coban wraps. 02/28/16; this is a patient I'm currently seeing monthly. She is gone on to have a skin graft over her large venous insufficiency ulcer on the left medial ankle. This was done by Dr. Marla Roe him. The patient is a bit perturbed about why she didn't have one on her right medial ankle wound. She has been using silver alginate to this. 03/06/16; I received a phone call from her plastic surgery Dr. Marla Roe. She expressed some concern about the viability of the skin graft she did on the left medial ankle wound. Asked me to place Endoform on this. She told me she is not planning to do a subsequent skin graft on the right as the left one did not take very well. I had placed Hydrofera Blue on the right 03/13/16; continue to have a reasonably healthy wound on the right medial ankle. Down to 3 mm in terms of size. There is epithelialization here. The area on the left medial ankle is her skin graft site. I suppose the last week this looks somewhat better. She has an open area inferiorly however in the center there appears to be some viable tissue. There is a lot of surface callus and eschar that will eventually need to come off however none of this looked to be infected. Patient states that the is able to keep the dressing on for several days which is an  improvement. 03/20/16 no major change in the circumference of either wound however on the left side the patient was at Dr. Eusebio Friendly office and they did a debridement of left wound. 50% of the wound seems to be epithelialized. I been using Endoform on the left Hydrofera Blue in the right 03/27/16; she arrives today with her wound is not  looking as healthy as they did last week. The area on the right clearly has an adherent surface to this a very similar surface on the left. Unfortunately for this patient this is all too familiar problem. Clearly the Endoform is not working and will need to change that today that has some potential to help this surface. She does not tolerate debridement in this clinic very well. She is changing the dressing wants 04/03/16; patient arrives with the wounds looking somewhat better especially on the right. Dr. Migdalia Dk change the dressing to silver alginate when she saw her on Monday and also sold her some compression socks. The usefulness of the latter is really not clear and woman with severely draining wounds. 04/10/16; the patient is doing a bit of an experiment wearing the compression stockings that Dr. Migdalia Dk provided her to her left leg and the out of legs based dressings that we provided to the right. 05/01/16; the patient is continuing to wear compression stockings Dr. Migdalia Dk provided her on the left that are apparently silver impregnated. She has been using Iodoflex to the right leg wound. Still a moderate amount of drainage, when she leaves here the wraps only last for 4 days. She has to change the stocking on the left leg every night 05/15/16; she is now using compression stockings bilaterally provided by Dr. Marla Roe. She is wearing a nonadherent layer over the wounds so really I don't think there is anything specific being done to this now. She has some reduction on the left wound. The right is stable. I think all healing here is being done without a  specific dressing 06/09/16; patient arrives here today with not much change in the wound certainly in diameter to large circular wounds over the medial aspect of her ankle bilaterally. Under the light of these services are certainly not viable for healing. There is no evidence of surrounding infection. She is wearing compression stockings with some sort of silver impregnation as prescribed by Dr. Marla Roe. She has a follow-up with her tomorrow. 06/30/16; no major change in the size or condition of her wounds. These are still probably covered with a nonviable surface. She is using only her purchase stockings. She did see Dr. Marla Roe who seemed to want to apply Dakin's solution to this I'm not extreme short what value this would be. I would suggest Iodoflex which she still has at home. 07/28/16; I follow Mrs. Higgerson episodically along with Dr. Marla Roe. 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 She has been applying a topical collagen ointment to both wounds with Adaptic. I don't think Dr. Marla Roe is planning to take her back to the OR. 08/19/16; I follow Mrs. Jeneen Rinks on a monthly basis along with Dr. Marla Roe of plastic surgery. 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 I been following her for a number of years. At one point I was able to get the right medial malleolus wound to heal and had the left medial malleolus down to about half its current size however and I had to send her to plastic surgery for an operative debridement. Since then things have been stable to slightly improve the area on the right is slightly better one in the left about the same although there is much less adherent surface than I'm used to with this patient. She is using some form of liquid collagen gel that Dr. Marla Roe provided a Kerlix  cover with the patient's own pressure stockings.  She tells me that she has extreme pain in both ankles and along the lateral aspect of both feet. She has been unable to work for some period of time. She is telling me she is retiring at the beginning of April. She sees Dr. Doran Durand of orthopedics next week 09/22/16; patient has not seen Dr. Marla Roe since the last time she is here. I'm not really sure what she is using to the wounds other than bits and pieces of think she had left over including most recently Hydrofera Blue. She is using juxtalite stockings. She is having difficulty with her husband's recent illness "stroke". She is having to transport him to various doctors appointments. Dr. Marla Roe left her the option of a repeat debridement with ACEL however she has not been able to get the time to follow-up on this. She continues to have a fair amount of drainage out of these wounds with certainly precludes leaving dressings on all week 10/13/16; patient has not seen Dr. Marla Roe since she was last in our clinic. I'm not really sure what she is doing with the wounds, we did try to get her [facility] and I think she is actually using this most of the time. Because of drainage she states she has to change this every second day although this is an improvement from what she used to do. She went to see Dr. Doran Durand who did not think she had a muscular issue with regards to her feet, he referred her to a neurologist and I think the appointment is sometime in June. I changed her back to Iodoflex which she has used in the past but not recently. 11/03/16; the patient has been using Iodoflex although she ran out of this. Still claims that there is a lot of drainage although the wound does not look like this. No surrounding erythema. She has not been back to see Dr. Marla Roe 11/24/16; the patient has been using Iodoflex again but she ran out of it 2 or 3 days ago. There is no major change in the condition of either one of these wounds in fact they are  larger and covered in a thick adherent surface slough/nonviable tissue especially on the left. She does not tolerate mechanical debridement in our clinic. Going back to see Dr. Marla Roe of plastic surgery for an operative debridement would seem reasonable. 12/15/16; the patient has not been back to see Dr. Marla Roe. She is been dealing with a series of illnesses and her husband which of monopolized her time. She is been using Sorbact which we largely supplied. She states the drainage is bad enough that it maximum she can go 2-3 days without changing the dressing 01/12/2017 -- the patient has not been back for about 4 weeks and has not seen Dr. Marla Roe not does she have any appointment pending. 01/23/17; patient has not seen Dr. Marla Roe even though I suggested this previously. She is using Santyl that was suggested last week by Dr. Con Memos this Cost her $16 through her insurance which is indeed surprising 02/12/17; continuing Santyl and the patient is changing this daily. A lot of drainage. She has not been back to see plastic surgery she is using an Ace wrap. Our intake nurse suggested wrap around stockings which would make a good reasonable alternative 02/26/17; patient is been using Santyl and changing this daily due to drainage. She has not been to see plastic surgery she uses in April Ace wrap to control the  edema. She did obtain extremitease stockings but stated that the edema in her leg was to big for these 03/20/17; patient is using Santyl and Anasept. Surfaces looked better today the area on the right is actually measuring a little smaller. She has states she has a lot of pain in her feet and ankles and is asking for a consult to pain control which I'll try to help her with through our case manager. 04/10/17; the patient arrives with better-looking wound surfaces and is slightly smaller wound on the left she is using a combination of Santyl and Anasept. She has an appointment or at least as  started in the pain control center associated with [facility] 05/14/17; this is a patient who I followed for a prolonged period of time. 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 At one point I had this down to a much smaller wound on the left however these reopened and we've never been able to get these to heal. She has been using Santyl and Anasept gel although 2 weeks ago she ran out of the Anasept gel. She has a stable appearance of the wound. She is going to the wound care clinic at [facility]. They wanted do a nerve block/spinal block although she tells me she is reluctant to go forward with that. 05/21/17; this is a patient I have followed for many years. 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 Chronic pain and deformity in her ankles as well. She is been to see plastic surgery as well as orthopedics. Using PolyMem AG most recently/Kerramax/ABDs and 2 layer compression. She has managed to keep this on and she is coming in for a nurse check to change the dressing on Tuesdays, we see her on Fridays 06/05/17; really quite a good looking surface and the area especially on the right medial has contracted in terms of dimensions. Well granulated healthy-looking tissue on both sides. Even with an open curet there is nothing that even feels abnormal here. This is as good as I've seen this in quite some time. We have been using PolyMem AG and bringing her in for a nurse check 06/12/17; really quite good surface on both of these wounds. The right medial has contracted a bit left is not. We've been using PolyMem and AG and she is coming in for a nurse visit 06/19/17; we have been using PolyMem AG and bringing her in for a nurse check. Dimensions of her wounds are not better but the surfaces looked better bilaterally. She complained of bleeding last night and the left wound and increasing pain bilaterally. She states her wound pain is more  neuropathic than just the wounds. There was some suggestion that this was radicular from her pain management doctor in talking to her it is really difficult to sort this out. 06/26/17; using PolyMem and AG and bringing her in for a nurse check as All of this and reasonably stable condition. Certainly not improved. The dimensions on the lateral part of the right leg look better but not really measuring better. The medial aspect on the left is about the same. 07/03/16; we have been using PolyMen AG and bringing her in for a nurse check to change the dressings as the wounds have drainage which precludes once weekly changing. We are using all secondary absorptive dressings.our intake nurse is brought up the idea of using a wound VAC/snap VAC on the wound to help with the drainage to see if this would  result in some contraction. This is not a bad idea. The area on the right medial is actually looking smaller. Both wounds have a reasonable-looking surface. There is no evidence of cellulitis. The edema is well controlled 07/10/17; the patient was denied for a snap VAC by her insurance. The major issue with these wounds continues to be drainage. We are using wicked PolyMem AG and she is coming in for a nurse visit to change this. The wounds are stable to slightly improved. The surface looks vibrant and the area on the right certainly has shrunk in size but very slowly 07/17/17; the patient still has large wounds on her bilateral medial malleoli. Surface of both of these wounds looks better. The dimensions seem to come and go but no consistent improvement. There is no epithelialization. We do not have options for advanced treatment products due to insurance issues. They did not approve of the wound VAC to help control the drainage. More recently we've been using PolyMem and AG wicked to allow drainage through. We have been bringing her in for a nurse visit to change this. We do not have a lot of options for wound  care products and the home again due to insurance issues 07/24/17; the patient's wound actually looks somewhat better today. No drainage measurements are smaller still healthy-looking surface. We used silver collagen under PolyMen started last week. We have been bringing her in for a dressing change 07/31/17; patient's wound surface continued to look better and I think there is visible change in the dimensions of the wound on the right. Rims of epithelialization. We have been using silver collagen under PolyMen and bringing her in for a dressing change. There appears to be less drainage although she is still in need of the dressing change 08/07/17. Patient's wound surface continues to look better on both sides and the area on the right is definitely smaller. We have been using silver collagen and PolyMen. She feels that the drainage has been it has been better. I asked her about her vascular status. She went to see Dr. Aleda Grana at [facility] and had some form of ablation. I don't have much detail on this. I haven't my notes from 2016 that she was not a candidate for any further ablation but I don't have any more information on this. We had referred her to vein and vascular I don't think she ever went. He does not have a history of PAD although I don't have any information on this either. We don't even have ABIs in our record 08/14/17; we've been using silver collagen and PolyMen cover. And putting the patient and compression. She we are bringing her in as a nurse visit to change this because ofarge amount of drainage. We didn't the ABIs in clinic today since they had been done in many moons 1.2 bilaterally. She has been to see vein and vascular however this was at [facility] and she had ablation although I really don't have any information on this all seemed biking get a report. She is also been operatively debrided by plastic surgery and had a cell placed probably 8-12 months ago. This didn't have  a major effect. We've been making some gains with current dressings 08/19/17-She is here in follow-up evaluation for bilateral medial malleoli ulcers. She continues to tolerate debridement very poorly. We will continue with recently changed topical treatment; if no significant improvement may consider switching to Iodosorb/Iodoflex. 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 She has been using silver collagen and PolyMem. I believe she has been used and tried on Iodoflex before. 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 These have been very refractory over time. We have been using silver collagen and PolyMen. She is coming in once a week for a doctor's and once a week for nurse visits. We are actually making some progress 09/18/17; the patient's wounds are smaller especially on the right medial. She arrives today to upset to consider even washing these off with Anasept which I think is been part of the reason this is been closing. We've been using collagen covered in PolyMen otherwise. It is noted that she has a small area of folliculitis on the right medial calf that. As we are wrapping her legs I'll give her a short course of doxycycline to make sure this doesn't amount to anything. She is a long list of complaints today including imbalance, shortness of breath on exertion, inversion of her left ankle. With regards to the latter complaints she is been to see orthopedics and they offered her a tendon release surgery I believe but wanted her wounds to be closed first. I have recommended she go see her primary physician with regards to everything else. 09/25/17; patient's wounds are about the same size. We have made some progress bilaterally although not in recent  weeks. She will not allow me T wash these wounds with Anasept even if she is doing her cell. Wheeze we've been using collagen covered in PolyMen. Last week she had a small area of folliculitis this is now opened into a small wound. She completed 5 days of trimethoprim sulfamethoxazole 10/02/17; unfortunately the area on her left medial ankle is worse with a larger wound area towards the Achilles. The patient complains of a lot of pain. She will not allow debridement although visually I don't think there is anything to debridement in any case. We have been using silver collagen and PolyMen for several months now. Initially we are making some progress although I'm not really seeing that today. We will move back to [facility]. His admittedly this is a bit of a repeat however I'm hoping that his situation is different now. The patient tells me she had her leg on the left give out on her yesterday this is process some pain. 10/09/17; the patient is seen twice a week largely because of drainage issues coming out of the chronic medial bimalleolar wounds that are chronic. Last week the dimensions of the one on the left looks a little larger I changed her to [facility]. She comes in today with a history of terrible pain in the bilateral wound areas. She will not allow debridement. She will not even allow a tissue culture. There is no surrounding erythema no no evidence of cellulitis. We have been putting her Kerlix Coban man. She will not allow more aggressive compression as there was a suggestion to put her in 3 layer wraps. 10/16/17; large wounds on her bilateral medial malleoli. These are chronic. Not much change from last week. The surface looks have healthy but absolutely no epithelialization. A lot of pain little less so of drainage. She will not allow debridement or even washing these off in the vigorous fashion with Anasept. 10/23/17; large wounds on her bilateral malleoli which are  chronic. Some  improvement in terms of size perhaps on the right since last time I saw these. She states that after we increased the 3 layer compression there was some bleeding, when she came in for a nurse visit she did not want 3 layer compression put back on about our nurse managed to convince her. She has known chronic venous visit issues and I'm hoping to get her to tolerate the 3 layer compression. using Hydrofera Blue 10/30/17; absolutely no change in the condition of either wound although we've had some improvement in dimensions on the right.. Attempted to put her in 3 layer compression she didn't tolerated she is back in 2 layer compression. We've been using Hydrofera Blue We looked over her past records. She had venous reflux studies in November 2016. There was no evidence of deep venous reflux on the right. Superficial vein did not show the greater saphenous vein at think this is been previously ablated the small saphenous vein was within normal limits. The left deep venous system showed no DVT the vessels were positive for deep venous reflux in the posterior tibial veins at the ankle. The greater saphenous vein was surgically absent small saphenous vein was within normal limits. She went to vein and vascular at [facility]. I believe she had an ablation on the left greater saphenous vein. I'll update her reflux studies perhaps ever reviewed by vein and vascular. We've made absolutely no progress in these wounds. Will also try to read and TheraSkins through her insurance 11/06/17; W the patient apparently has a 2 week follow-up with vein and vascular I like him to review the whole issue with regards to her previous vascular workup by Dr. Aleda Grana. We've really made no progress on these wounds in many months. She arrives today with less viable looking surface on the left medial ankle wound. This was apparently looking about the same on Tuesday when she was here for nurse visit. 11/13/17; deep tissue  culture I did last time of the left lower leg showed multiple organisms without any predominating. In particular no Staphylococcus or group A strep were isolated. We sent her for venous reflux studies. She's had a previous left greater saphenous vein stripping and I think sclerotherapy of the right greater saphenous vein. She didn't really look at the lesser saphenous vein this both wounds are on the medial aspect. She has reflux in the common femoral vein and popliteal vein and an accessory vein on the right and the common femoral vein and popliteal vein on the left. I'm going to have her go to see vein and vascular just the look over things and see if anything else beside aggressive compression is indicated here. We have not been able to make any progress on these wounds in spite of the fact that the surface of the wounds is never look too bad. 11/20/17; no major change in the condition of the wounds. Patient reports a large amount of drainage. She has a lot of complaints of pain although enlisting her today I wonder if some of this at least his neuropathic rather than secondary to her wounds. She has an appointment with vein and vascular on 12/30/17. The refractory nature of these wounds in my mind at least need vein and vascular to look over the wounds the recent reflux studies we did and her history to see if anything further can be done here. I also note her gait is deteriorated quite a bit. Looks like she has inversion of her  foot on the right. She has a bilateral Trendelenburg gait. I wonder if this is neuropathic or perhaps multilevel radicular. 11/27/17; her wounds actually looks slightly better. Healthy-looking granulation tissue a scant amount of epithelialization. [place] healthcare will not pay for [employer]. They will play for tri layer Oasis and Dermagraft. This is not a diabetic ulcer. We'll try for the tri layer Oasis. She still complains of some drainage. She has a vein and vascular  appointment on 12/30/17 12/04/17; the wounds visually look quite good. Healthy-looking granulation with some degree of epithelialization. We are still waiting for response to our request for trial to try layer Oasis. Her appointment with vascular to review venous and arterial issues isn't sold the end of July 7/31. Not allow debridement or even vigorous cleansing of the wound surface. 12/18/17; slightly smaller especially on the right. 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 We've been using Hydrofera Blue. We still are looking into triple layer Oasis through her insurance 01/08/18 on evaluation today patient's wound actually appears to be showing signs of good improvement at this point in time. She has been tolerating the dressing changes without complication. Fortunately there does not appear to be any evidence of infection at this point in time. We have been utilizing silver nitrate which does seem to be of benefit for her which is also good news. Overall I'm very happy with how things seem to be both regards appearance as well as measurement. Patient did see Dr. Bridgett Larsson for evaluation on 12/30/17. In his assessment he felt that stripping would not likely add much more than chronic compression to the patient's healing process. His recommendation was to follow-up in three months with Dr. Doren Custard if she hasn't healed in order to consider referral back to you and see vascular where she previously was in a trial and was able to get her wound to heal. I'll be see what she feels she when you staying compression and he reiterated this as well. 01/13/18 on evaluation today patient appears to actually be doing very well in regard to her bilateral medial malleolus ulcers. She seems to have tolerated the chemical cauterization with silver nitrate last week she did have some pain through that evening but fortunately states that I'll be see since it seems to be doing better she is overall  pleased with the progress. 01/21/18; really quite a remarkable improvement since I've last seen these wounds. We started using silver nitrate specially on the islands of hyper granulation which for some reason her around the wound circumference. This is really done quite nicely. Primary dressing Hydrofera Blue under 4 layer compression. She seems to be able to hold out without a nurse rewrap. Follow-up in 1 week 01/28/18; we've continued the hydrofera blue but continued with chemical cauterization to the wound area that we started about a month ago for irregular hyper granulation. She is made almost stunning improvement in the overall wound dimensions. I was not really expecting this degree of improvement in these chronic wounds 02/05/18; we continue with Hydrofera Bluebut of also continued the aggressive chemical cauterization with silver nitrate. We made nice progress with the right greater than left wound. 02/12/18. We continued with Hydrofera Blue after aggressive chemical cauterization with silver nitrate. We appear to be making nice progress with both wound areas 02/19/2018; we continue with [facility] after washing the wounds vigorously with Anasept spray and chemical cauterization with silver nitrate. We are making excellent progress. The area on the right's just about closed 02/26/2018.  The area on the left medial ankle had too much necrotic debris today. I used a #5 curette we are able to get most of the soft. I continued with the silver nitrate to the much smaller wound on the right medial ankle she had a new area on her right lower pretibial area which she says was due to a role in her compression 03/05/2018; both wound areas look healthy. Not much change in dimensions from last week. I continue to use silver nitrate and Hydrofera Blue. The patient saw Dr. Doren Custard of vein and vascular. He felt she had venous stasis ulcers. He felt based on her previous arteriogram she should have  adequate circulation for healing. Also she has deep venous reflux but really no significant correctable superficial venous reflux at this time. He felt we should continue with conservative management including leg elevation and compression 04/02/2018; since we last saw this woman about a month ago she had a fall apparently suffered a pelvic fracture. I did not look up the x-ray. Nevertheless because of pain she literally was bedbound for 2 weeks and had home health coming out to change the dressing. Somewhat predictably this is resulted in considerable improvement in both wound areas. The right is just about closed on the medial malleolus and the left is about half the size. 04/16/2018; both her wounds continue to go down in size. Using Hydrofera Blue. 05/07/18; both her wounds appeared to be improving especially on the right where it is almost closed. We are using Hydrofera Blue 05/14/2018; slightly worse this week with larger wounds. Surface on the left medial not quite as good. We have been using Hydrofera Blue 05/21/18; again the wounds are slightly larger. Left medial malleolus slightly larger with eschar around the circumference. We have been using Hydrofera Blue undergoing a wraps for a prolonged period of time. This got a lot better when she was more recumbent due to a fall and a back injury. I change the primary dressing the silver alginate today. She did not tolerate a 4 layer compression previously although I may need to bring this up with her next time 05/28/2018; area on the left medial malleolus again is slightly larger with more drainage. Area on the right is roughly unchanged. She has a small area of folliculitis on the right medial just on the lower calf. This does not look ominous. 06/03/2018 left medial malleolus slightly smaller in a better looking surface. We used silver nitrate on this last time with silver alginate. The area on the right appears slightly smaller 1/10; left medial  malleolus slightly smaller. Small open area on the right. We used silver nitrate and silver alginate as of 2 weeks ago. We continue with the wound and compression. These got a lot better when she was off her feet 1/17; right medial malleolus wound is smaller. The left may be slightly smaller. Both surfaces look somewhat better. 1/24; both wounds are slightly smaller. Using silver alginate under Unna boots 1/31; both wounds appear smaller in fact the area on the right medial is just about closed. Surface eschar. We have been using silver alginate under Unna boots. The patient is less active now spends let much less time on her feet and I think this is contributed to the general improvement in the wound condition 2/7; both wounds appear smaller. I was hopeful the right medial would be closed however there there is still the same small open area. Slight amount of surface eschar on the left the dimensions  are smaller there is eschar but the wound edges appear to be free. We have been using silver alginate under Unna boot's 2/14; both wounds once again measure smaller. Circumferential eschar on the left medial. We have been using silver alginate under Unna boots with gradual improvement 2/21; the area on the right medial malleolus has healed. The area on the left is smaller. We have been using silver alginate and Unna boots. We can discharge wrapping the right leg she has 20/30 stockings at home she will need to protect the scar tissue in this area 2/28; the area on the right medial malleolus remains closed the patient has a compression stocking. The area on the left is smaller. We have been using silver alginate and Unna boots. 3/6 the area on the right medial ankle remains closed. Good edema control noted she is using her own compression stocking. The area on the left medial ankle is smaller. We have been managing this with silver alginate and Unna boots which we will continue today. 3/13; the area on  the right medial ankle remains closed and I'm declaring it healed today. When necessary the left is about the same still a healthy-looking surface but no major change and wound area. No evidence of infection and using silver alginate under unna and generally making considerable improvement 3/27 the area on the right medial ankle remains closed the area on the left is about the same as last week. Certainly not any worse we have been using silver alginate under an Unna boot 4/3; the area on the right medial ankle remains closed per the patient. We did not look at this wound. The wound on the left medial ankle is about the same surface looks healthy we have been using silver alginate under an Unna boot 4/10; area on the right medial ankle remains closed per the patient. We did not look at this wound. The wound on the left medial ankle is slightly larger. The patient complains that the [facility] caused burning pain all week. She also told us that she was a lot more active this week. Changed her back to silver alginate 4/17; right medial ankle still closed per the patient. Left medial ankle is slightly larger. Using silver alginate. She did not tolerate Hydrofera Blue on this area 4/24; right medial ankle remains closed we have not look at this. The left medial ankle continues to get larger today by about a centimeter. We have been using silver alginate under Unna boots. She complains about 4 layer compression as an alternative. She has been up on her feet working on her garden 5/8; right medial ankle remains closed we did not look at this. The left medial ankle has increased in size about 100%. We have been using silver alginate under Unna boots. She noted increased pain this week and was not surprised that the wound is deteriorated 5/15; no major change in SA however much less erythema ( one week of doxy ocellulitis). 5/22-65 year old female returns at 1 week to the clinic for left medial ankle  wound for which we have been using silver alginate under 3 layer compression She was placed on DOXY at last visit - the wound is wider at this visit. She is in 3 layer compression 5/29; change to [facility] last week. I had given her empiric doxycycline 2 weeks ago for a week. She is in 3 layer compression. She complains of a lot of pain and drainage on presentation today. 6/5; using Hydrofera Blue. I  gave her doxycycline recently empirically for erythema and pain around the wound. Believe her cultures showed enterococcus which not would not have been well covered by doxycycline nevertheless the wound looks better and I don't feel specifically that the enterococcus needs to be covered. She has a new what looks like a wrap injury on her lateral left ankle. 6/12; she is using Hydrofera Blue. She has a new area on the left anterior lower tibial area. This was a wrap injury last week. 6/19; the patient is using Hydrofera Blue. She arrived with marked inflammation and erythema around the wound and tenderness. 12/01/18 on evaluation today patient appears to be doing a little bit better based on what I'm hearing from the standpoint of lassos evaluation to this as far as the overall appearance of the wound is concerned. Then sometime substandard she typically sees Dr. Dellia Nims. Nonetheless overall very pleased with the progress that she's made up to this point. No fevers, chills, nausea, or vomiting noted at this time. 7/10; some improvement in the surface area. Aggressively debrided last week apparently. I went ahead with the debridement today although the patient does not tolerate this very well. We have been using Iodoflex. Still a fair amount of drainage 7/17; slightly smaller. Using Iodoflex. 7/24; no change from last week in terms of surface area. We have been using Iodoflex. Surface looks and continues to look somewhat better 7/31; surface area slightly smaller better looking surface. We have been  using Iodoflex. This is under Unna boot compression 8/7-Patient presents at 1 week with Unna boot and Iodoflex, wound appears better 8/14-Patient presents at 1 week with Iodoflex, we use the Unna boot, wound appears to be stable better.Patient is getting Botox treatment for the inversion of the foot for tendon release, Next week 8/21; we are using Iodoflex. Unna boot. The wound is stable in terms of surface area. Under illumination there is some areas of the wound that appear to be either epithelialized or perhaps this is adherent slough at this point I was not really clear. It did not wipe off and I was reluctant to debride this today. 8/28; we are using Iodoflex in an Unna boot. Seems to be making good improvement. 9/4; using Iodoflex and wound is slightly smaller. 9/18; we are using Iodoflex with topical silver nitrate when she is here. The wound continues to be smaller 10/2; patient missed her appointment last week due to GI issues. She left and Iodoflex based dressing on for 2 weeks. Wound is about the same size about the size of a dime on the left medial lower 10/9 we have been using Iodoflex on the medial left ankle wound. She has a new superficial probable wrap injury on the dorsal left ankle 10/16; we have been using Hydrofera Blue since last week. This is on the left medial ankle 10/23; we have been using Hydrofera Blue since 2 weeks ago. This is on the left medial ankle. Dimensions are better 11/6; using Hydrofera Blue. I think the wound is smaller but still not closed. Left medial ankle 11/13; we have been using Hydrofera Blue. Wound is certainly no smaller this week. Also the surface not as good. This is the remanent of a very large area on her left medial ankle. 11/20; using Sorbact since last week. Wound was about the same in terms of size although I was disappointed about the surface debris 12/11; 3-week follow-up. Patient was on vacation. Wound is measuring slightly larger we have  been using Sorbact. 12/18;  wound is about the same size however surface looks better last week after debridement. We have been using Sorbact under compression 1/15 wound is probably twice the size of last time increased in length nonviable surface. We have been using Sorbact. She was running a mild fever and missed her appointment last week 1/22; the wound is come down in size but under illumination still a very adherent debris we have been Hydrofera Blue that I changed her to last week 1/29; dimensions down slightly. We have been using Hydrofera Blue 2/19 dimensions are the same however there is rims of epithelialization under illumination. Therefore more the surface area may be epithelialized 2/26; the patient's wound actually measures smaller. The wound looks healthy. We have been using Hydrofera Blue. I had some thoughts about running Apligraf then I still may do that however this looks so much better this week we will delay that for now 3/5; the wound is small but about the same as last week. We have been using Hydrofera Blue. No debridement is required today. 3/19; the wound is about the size of a dime. Healthy looking wound even under illumination. We have been using Hydrofera Blue. No mechanical debridement is necessary 3/26; not much change from last week although still looks very healthy. We have been using Hydrofera Blue under Unna boots Patient was offered an ankle fusion by podiatry but not until the wound heals with a proceed with this. 4/9; the patient comes in today with her original wound on the medial ankle looking satisfactory however she has some uncontrolled swelling in the middle part of her leg with 2 new open areas superiorly just lateral to the tibia. I think this was probably a wrap issue. She said she felt uncomfortable during the week but did not call in. We have been using Hydrofera Blue 4/16; the wound on the medial ankle is about the same. She has innumerable small  areas superior to this across her mid tibia. I think this is probably folliculitis. She is also been working in the yard doing a lot of sweating 4/30; the patient issue on the upper areas across her mid tibia of all healed. I think this was excessive yard work if I remember. Her wound on the medial ankle is smaller. Some debris on this we have been using Hydrofera Blue under Unna boots 5/7; mid tibia. She has been using Hydrofera Blue under an Unna wrap. She is apparently going for her ankle surgery on June 3 10/28/19-Patient returns to clinic with the ankle wound, we are using Hydrofera Blue under Unna wrap, surgery is scheduled for her left foot for June 3 so she will be back for nurse visit next week READMISSION 01/17/2020 Mrs. Opitz is a 65 year old woman we have had in this clinic for a long period of time with severe venous hypertension and refractory wounds on her medial lower legs and ankles bilaterally. 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 When she was off her legs for a prolonged period example when she fell and suffered a compression fracture things would heal up quite nicely. She is now retired and we managed to heal up the right medial leg wound. The left one was very tiny last time I saw this although still refractory. 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 It got to the point where this was interfering with her walking and she elected to proceed with a ankle arthrodesis to straighten her her ankle and leave her with a functional outcome for mobilization. The patient was referred to Dr. Doren Custard and really this took some time to arrange. Dr. Doren Custard saw her on 12/07/2019. Once again he verified that she had no arterial issues. She had previously had an angiogram several  years ago. Follow-up ABIs on the left showed an ABI of 1.12 with triphasic waveforms and a TBI of 0.92. She is felt to have chronic deep venous insufficiency but I do not think it was felt that anything could be done from about this from an ablation point of view. At the time Dr. Doren Custard saw this patient the wounds actually look closed via the pictures in his clinic. The patient finally underwent her surgery on 12/15/2019. This went reasonably well and there was a good anatomic outcome. She developed a small distal wound dehiscence on the lateral part of the surgical wound. However more problematically she is developed recurrence of the wound on the medial left ankle. There are actually 2 wounds here one in the distal lower leg and 1 pretty much at the level of the medial malleolus. It is a more distal area that is more problematic. She has been using Hydrofera Blue which started on Friday before this she was simply Ace wrapping. There was a culture done that showed Pseudomonas and she is on ciprofloxacin. A recent CNS on 8/11 was negative. The patient reports some pain but I generally think this is improving. She is using a cam boot completely nonweightbearing using a walker for pivot transfers and a wheelchair 8/24; not much improvement unfortunately she has a surgical wound on the lateral part in the venous insufficiency wound medially. The bottom part of the medial insufficiency wound is still necrotic there is exposed tendon here. We have been using Hydrofera Blue under compression. Her edema control is however better 8/31; patient in for follow-up of his surgical wound on the lateral part of her left leg and chronic venous insufficiency ulcers medially. We put her back in compression last week. She comes in today with a complaint of 3 or 4 days worth of increasing pain. She felt her cam walker was rubbing on the area on the back of her heel. However there is intense erythema seems more likely she  has cellulitis. She had 2 cultures done when she was seeing podiatry in the postop. One of them in late July showed Pseudomonas and she received a course of ciprofloxacin the other was negative on 8/11 she is allergic to penicillin with anaphylactoid complaints of hives oral swelling via information in epic 9/9; when I saw this patient last week she had intense anterior erythema around her wound on the right lateral heel and ankle and also into the right medial heel. Some of this was no doubt drainage and her walker boot however I was convinced she had cellulitis. I gave her Levaquin and Bactrim she is finishing up on this now. She is following up with Dr. [name] he saw her yesterday. He is taken her out of the walking boot of course she is still nonweightbearing. Her x-ray was negative for any worrisome features such as soft tissue air etc. Things are a lot better this week. She has home health. We have been using Hydrofera Blue under an [employer] which she put back on yesterday. I did  not wrap her last week 9/17; her surrounding skin looks a lot better. In fact the area on the left lateral ankle has just a scant amount of eschar. The only remaining wound is the large area on the left medial ankle. Probably about 60% of this is healthy granulation at the surface however she has a significant divot distally. This has adherent debris in it. I been using debridement and silver collagen to try and get this area to fill-in although I do not think we have made much progress this week 9/24; the patient's wound on the left medial ankle looks a lot better. The deeper divot area distally still requires debridement but this is cleaning up quite nicely we have been using silver collagen. The patient is complaining of swelling in her foot and is worried that that is contributing to the nonhealing of the ankle wound. She is also complaining of numbness in her anterior toes 10/4; left medial ankle. The small area  distally still has a divot with necrotic material that I have been debriding away. This has an undermining area. She is approved for Apligraf. She saw Dr. [name] her surgeon on 10/1. I think he declared himself is satisfied with the condition of things. Still nonweightbearing till the end of the month. 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 Her surgical wound is well closed. There is no evidence of infection 10/11; the patient arrived in clinic today with the expectation that we be able to put an Apligraf on this area after debridement however she arrives with a relatively large amount of green drainage on the dressing. The patient states that this started on Friday. She has not been systemically unwell. 10/19; culture I did last week showed both Enterococcus and Pseudomonas. I think this came in separate parts because I stopped her ciprofloxacin I gave her and prescribed her linezolid however now looking at the final culture result this is Pseudomonas which is resistant to quinolones. She has not yet picked up the linezolid apparently phone issues. We are also trying to get a topical antibiotic out of [place] in [place] they can be applied by home health. She is still having green drainage 10/16; the patient has her topical antibiotic from [facility] in [place]. This is a compounded gel with vancomycin and ciprofloxacin and gentamicin. We are applying this on the wound bed with silver alginate over the top with Unna boot wraps. She arrives in clinic today with a lot less ominous looking drainage although she is only use this topical preparation once the second time today. She sees Dr. [name] her surgeon on Friday she has home health changing the dressing 11/2; still using her compounded topical antibiotic under silver alginate. Surface is cleaning up there is less drainage. We had an Apligraf for her today and I elected to apply it. A light coating of  her antibiotic 04/25/2020 upon evaluation today patient appears to be doing well with regard to her ankle ulcer. There is a little bit of slough noted on the surface of the wound I am can have to perform sharp debridement to clear this away today. With that being said other than that fact overall I feel like she is making progress and we do see some new epithelial growth. There is also some improvement in the depth of the wound and that distal portion. There is little bit of slough there as well. 12/7; 2-week follow-up. Apligraf #3. Dimensions are smaller. Closing in  especially inferiorly. Still some surface debris. Still using the [facility] topical antibiotic but I told her that I don't think this needs to be renewed 12/21; 2-week follow-up. Apligraf #4 dimensions are smaller. Nice improvement 06/05/2020; 2-week follow-up. The patient's wound on the left medial ankle looks really excellent. Nice granulation. Advancing epithelialization no undermining no evidence of infection. We would have to reapply for another Apligraf but with the condition of this wound I did not feel strongly about it. We used Hydrofera Blue under the same degree of compression. She follows up with Dr. [name] her surgeon a week Friday 06/13/2020 upon evaluation today patient appears to be doing excellent in regard to her wound. She has been tolerating the dressing changes without complication. Fortunately there is no signs of active infection at this time. No fevers, chills, nausea, vomiting, or diarrhea. She was using Hydrofera Blue last week. 06/20/2020 06/20/2020 on evaluation today patient actually appears to be doing excellent in regard to her wound. This is measuring better and looking much better as well. She has been using the collagen that seems to be doing better for her as well even though the [facility] was and is not sticking or feeling as rough on her wound. She did see Dr. [name] on Friday he is very pleased he also  stated none of the hardware has shifted. That is great news 1/27; the patient has a small clean wound all that is remaining. I agree that this is too small to really consider an Apligraf. Under illumination the surface is looking quite good. We have been using collagen although the dimensions are not any better this week 3/2; the patient has a small clean wound on the left medial ankle. Although this left of her substantial original areas. Measurements are smaller. We have been using polymen Ag under an Haematologist. Electronic Signature(s) Signed: 07/05/2020 4:57:15 PM By: Linton Ham MD Entered By: Linton Ham on 07/05/2020 10:34:51 -------------------------------------------------------------------------------- Physical Exam Details Patient Name: Date of Service: [name], [name] 07/05/2020 9:30 A M Medical Record Number: [id number] Patient Account Number: [id number] Date of Birth/Sex: Treating RN: 1956-03-03 (65 y.o. [name] Primary Care Provider: [name] Other Clinician: Referring Provider: Treating Provider/Extender: [name] in Treatment: 24 Constitutional Sitting or standing Blood Pressure is within target range for patient.. Pulse regular and within target range for patient.Marland Kitchen Respirations regular, non-labored and within target range.. Temperature is normal and within the target range for the patient.Marland Kitchen Appears in no distress. Cardiovascular Fetal pulses are palpable. Edema control is adequate. Notes Wound exam; the wound was debrided with Anasept and gauze. Healthy looking granulation. No surrounding infection. Depth perhaps 7 mm Electronic Signature(s) Signed: 07/05/2020 4:57:15 PM By: Linton Ham MD Entered By: Linton Ham on 07/05/2020 10:35:44 -------------------------------------------------------------------------------- Physician Orders Details Patient Name: Date of Service: [name], [name] 07/05/2020 9:30 A  M Medical Record Number: [id number] Patient Account Number: [id number] Date of Birth/Sex: Treating RN: 1955/10/28 (65 y.o. [name], Meta.Reding Primary Care Provider: [name] Other Clinician: Referring Provider: Treating Provider/Extender: [name] in Treatment: 24 Verbal / Phone Orders: No Diagnosis Coding ICD-10 Coding Code Description [phone] Chronic venous hypertension (idiopathic) with ulcer and inflammation of left lower extremity L97.828 Non-pressure chronic ulcer of other part of left lower leg with other specified severity L97.328 Non-pressure chronic ulcer of left ankle with other specified severity Follow-up Appointments Return Appointment in 1 week. Bathing/ Shower/ Hygiene May shower with  protection but do not get wound dressing(s) wet. Edema Control - Lymphedema / SCD / Other Elevate legs to the level of the heart or above for 30 minutes daily and/or when sitting, a frequency of: Avoid standing for long periods of time. Exercise regularly Compression stocking or Garment 20-30 mm/Hg pressure to: - right leg daily Wound Treatment Wound #15 - Malleolus Wound Laterality: Left, Medial Cleanser: Soap and Water 1 x Per Week/15 Days Discharge Instructions: May shower and wash wound with dial antibacterial soap and water prior to dressing change. Peri-Wound Care: Triamcinolone 15 (g) 1 x Per Week/15 Days Discharge Instructions: Use triamcinolone 15 (g) as directed Peri-Wound Care: Sween Lotion (Moisturizing lotion) 1 x Per Week/15 Days Discharge Instructions: Apply moisturizing lotion as directed Prim Dressing: PolyMem Silver Non-Adhesive Dressing, 4.25x4.25 in 1 x Per Week/15 Days ary Discharge Instructions: Apply to wound bed as instructed Secondary Dressing: Woven Gauze Sponge, Non-Sterile 4x4 in (Home Health) 1 x Per Week/15 Days Discharge Instructions: Apply over primary dressing as directed. Secondary Dressing: ABD Pad, 5x9 (Home Health) 1  x Per Week/15 Days Discharge Instructions: Apply over primary dressing as directed. Compression Wrap: Unnaboot w/Calamine, 4x10 (in/yd) (Home Health) 1 x Per Week/15 Days Discharge Instructions: Apply Unnaboot as directed. Electronic Signature(s) Signed: 07/05/2020 4:57:15 PM By: Linton Ham MD Signed: 07/05/2020 5:30:16 PM By: Deon Pilling Entered By: Deon Pilling on 07/05/2020 10:29:04 -------------------------------------------------------------------------------- Problem List Details Patient Name: Date of Service: [name], [name] 07/05/2020 9:30 A M Medical Record Number: [id number] Patient Account Number: [id number] Date of Birth/Sex: Treating RN: [birth year] (65 y.o. [name] Primary Care Provider: [name] Other Clinician: Referring Provider: Treating Provider/Extender: [name] in Treatment: 24 Active Problems ICD-10 Encounter Code Description Active Date MDM Diagnosis I87.332 Chronic venous hypertension (idiopathic) with ulcer and inflammation of left 01/17/2020 No Yes lower extremity L97.828 Non-pressure chronic ulcer of other part of left lower leg with other specified 01/17/2020 No Yes severity L97.328 Non-pressure chronic ulcer of left ankle with other specified severity 01/17/2020 No Yes Inactive Problems ICD-10 Code Description Active Date Inactive Date L03.116 Cellulitis of left lower limb 01/31/2020 01/31/2020 T81.31XD Disruption of external operation (surgical) wound, not elsewhere classified, subsequent 01/17/2020 01/17/2020 encounter Resolved Problems Electronic Signature(s) Signed: 07/05/2020 4:57:15 PM By: Linton Ham MD Entered By: Linton Ham on 07/05/2020 10:33:59 -------------------------------------------------------------------------------- Progress Note Details Patient Name: Date of Service: [name], [name] 07/05/2020 9:30 A M Medical Record Number: [id number] Patient Account Number:  [id number] Date of Birth/Sex: Treating RN: 26-Feb-1956 (65 y.o. [name] Primary Care Provider: [name] Other Clinician: Referring Provider: Treating Provider/Extender: [name] in Treatment: 24 Subjective History of Present Illness (HPI) the remaining wound is over the left medial ankle. Similar wound over the right medial ankle healed largely with use of Apligraf. Most recently we have been using Hydrofera Blue over this wound with considerable improvement. The patient has been extensively worked up in the past for her venous insufficiency and she is not a candidate for antireflux surgery although I have none of the details available currently. 08/24/14; considerable improvement today. About 50% of this wound areas now epithelialized. The base of the wound appears to be healthier granulation.as opposed to last week when she had deteriorated a considerable improvement 08/17/14; unfortunately the wound has regressed somewhat. The areas of epithelialization from the superior aspect are not nearly as healthy as they were last week. The patient thinks her Hydrofera Blue slipped.  09/07/14; unfortunately the area has markedly regressed in the 2 weeks since I've seen this. There is an odor surrounding erythema. The healthy granulation tissue that we had at the base of the wound now is a dusky color. The nurse reports green drainage 09/14/14; the area looks somewhat better than last week. There is less erythema and less drainage. The culture I did did not show any growth. Nevertheless I think it is better to continue the Cipro and doxycycline for a further week. The remaining wound area was debridement. 09/21/14. Wound did not require debridement last week. Still less erythema and less drainage. She can complete her antibiotics. The areas of epithelialization in the superior aspect of the wound do not look as healthy as they did some weeks ago 10/05/14 continued  improvement in the condition of this wound. There is advancing epithelialization. Less aggressive debridement required 10/19/14 continued improvement in the condition and volume of this wound. Less aggressive debridement to the inferior part of this to remove surface slough and fibrinous eschar 11/02/14 no debridement is required. The surface granulation appears healthy although some of her islands of epithelialization seem to have regressed. No evidence of infection 11/16/14; lites surface debridement done of surface eschar. The wound does not look to be unhealthy. No evidence of infection. Unfortunately the patient has had podiatry issues in the right foot and for some reason has redeveloped small surface ulcerations in the medial right ankle. Her original presentation involved wounds in this area 11/23/14 no debridement. The area on the right ankle has enlarged. The left ankle wound appears stable in terms of the surface although there is periwound inflammation. There has been regression in the amount of new skin 11/30/14 no debridement. Both wound areas appear healthy. There was no evidence of infection. The the new area on the right medial ankle has enlarged although that both the surfaces appear to be stable. 12/07/14; Debridement of the right medial ankle wound. No no debridement was done on the left. 12/14/14 no major change in and now bilateral medial ankle wounds. Both of these are very painful but the no overt evidence of infection. She has had previous venous ablation 12/21/14; patient states that her right medial ankle wound is considerably more painful last week than usual. Her left is also somewhat painful. She could not tolerate debridement. The right medial ankle wound has fibrinous surface eschar 12/28/14 this is a patient with severe bilateral venous insufficiency ulcers. For a considerable period of time we actually had the one on the right medial ankle healed however this recently opened  up again in June. The left medial ankle wound has been a refractory area with some absent flows. We had some success with Hydrofera Blue on this area and it literally closed by 50% however it is recently opened up Foley. Both of these were debridement today of surface eschar. She tolerates this poorly 01/25/15: No change in the status of this. Thick adherent escar. Very poor tolerance of any attempt at debridement. I had healed the right medial malleolus wound for a considerable amount of time and had the left one down to about 50% of the volume although this is totally regressed over the last 48 weeks. Further the right leg has reopened. she is trying to make a appointment with pain and vascular, previous ablations with Dr. Aleda Grana. I do not believe there is an arterial insufficiency issue here 02/01/15 the status of the adherent eschar bilaterally is actually improved. No debridement was done. She  did not manage to get vascular studies done 02/08/15 continued debridement of the area was done today. The slough is less adherent and comes off with less pressure. There is no surrounding infection peripheral pulses are intact 02/15/15 selective debridement with a disposable curette. Again the slough is less adherent and comes off with less difficulty. No surrounding infection peripheral pulses are intact. 02/22/15 selective debridement of the right medial ankle wound. Slough comes off with less difficulty. No obvious surrounding infection peripheral pulses are intact I did not debridement the one on the left. Both of these are stable to improved 03/01/15 selective debridement of both wound areas using a curette to. Adherent slough cup soft with less difficulty. No obvious surrounding infection. The patient tells me that 2 days ago she noted a rash above the right leg wrap. She did not have this on her lower legs when she change this over she arrives with widespread left greater than right almost  folliculitis-looking rash which is extremely pruritic. I don't see anything to culture here. There is no rash on the rest of her body. She feels well systemically. 03/08/15; selective debridement of both wounds using a curette. Base of this does not look unhealthy. She had limegreen drainage coming out of the left leg wound and describes a lot of drainage. The rash on her left leg looks improved to. No cultures were done. 03/22/15; patient was not here last week. Basal wounds does not look healthy and there is no surrounding erythema. No drainage. There is still a rash on the left leg that almost looks vasculitic however it is clearly limited to the top of where the wrap would be. 04/05/15; on the right required a surgical debridement of surface eschar and necrotic subcutaneous tissue. I did not debridement the area on the left. These continue to be large open wounds that are not changing that much. We were successful at one point in healing the area on the right, and at the same time the area on the left was roughly half the size of current measurements. I think a lot of the deterioration has to do with the prolonged time the patient is on her feet at work 04/19/15 I attempted-like surface debridement bilaterally she does not tolerate this. She tells me that she was in allergic care yesterday with extreme pain over her left lateral malleolus/ankle and was told that she has an "sprain" 05/03/15; large bilateral venous insufficiency wounds over the medial malleolus/medial aspect of her ankles. She complains of copious amounts of drainage and his usual large amounts of pain. There is some increasing erythema around the wound on the right extending into the medial aspect of her foot to. historically she came in with these wounds the right one healed and the left one came down to roughly half its current size however the right one is reopened and the left is expanded. 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 05/10/15 large bilateral venous insufficiency wounds. There is less adherence surface left however the surface culture that I did last week grew pseudomonas therefore bilateral selective debridement score necessary. There is surrounding erythema. The patient describes severe bilateral drainage and a lot of pain in the left ankle. Apparently her podiatrist was were ready to do a cortisone shot 05/17/15; the patient complains of pain and again copious amounts of drainage. 05/24/15; we used Iodo flex last week. Patient notes considerable improvement in  wound drainage. Only needed to change this once. 05/31/15; we continued Iodoflex; the base of these large wounds bilaterally is not too bad but there is probably likely a significant bioburden here. I would like to debridement just doesn't tolerate it. 06/06/14 I would like to continue the Iodoflex although she still hasn't managed to obtain supplies. She has bilateral medial malleoli or large wounds which are mostly superficial. Both of them are covered circumferentially with some nonviable fibrinous slough although she tolerates debridement very poorly. She apparently has an appointment for an ablation on the right leg by interventional radiology. 06/14/15; the patient arrives with the wounds and static condition. We attempted a debridement although she does not do well with this secondary to pain. I 07/05/15; wounds are not much smaller however there appears to be a cleaner granulating base. The left has tight fibrinous slough greater than the right. Debridement is tolerated poorly due to pain. Iodoflex is done more for these wounds in any of the multitude of different dressings I have tried on the left 1 and then subsequently the right. 07/12/15; no change in the condition of this wound. I am able to do an aggressive debridement on the right but not the left. She simply cannot tolerate it. We have been  using Iodoflex which helps somewhat. It is worthwhile remembering that at one point we healed the right medial ankle wound and the left was about 25% of the current circumference. We have suggested returning to vascular surgery for review of possible further ablations for one reason or another she has not been able to do this. 07/26/15 no major change in the condition of either wound on her medial ankle. I did not attempt to debridement of these. She has been aggressively scrubbing these while she is in the shower at home. She has her supply of Iodoflex which seems to have done more for these wounds then anything I have put on recently. 08/09/15 wound area appears larger although not verified by measurements. Using Iodoflex 09/05/2015 -- she was here for avisit today but had significant problems with the wound and I was asked to see her for a physician opinion. I have summarize that this lady has had surgery on her left lower extremity about 10 years ago where the possible veins stripping was done. She has had an opinion from interventional radiology around November 2016 where no further sclerotherapy was ordered. The patient works 12 hours a day and stands on a concrete floor with work boots and is unable to get the proper compression she requires and cannot elevate her limbs appropriately at any given time. She has recently grown Pseudomonas from her wound culture but has not started her ciprofloxacin which was called in for her. 09/13/15 this continues to be a difficult situation for this patient. At one point I had this wound down to a 1.5 x 1.5" wound on her left leg. This is deteriorated and the right leg has reopened. She now has substantial wounds on her medial calcaneus, malleoli and into her lower leg. One on the left has surface eschar but these are far too painful for me to debridement here. She has a vascular surgery appointment next week to see if anything can be done to help here. I  think she has had previous ablations several years ago at [facility]. She has no major edema. She tells me that she did not get product last time [facility]] and went for several days without it. She continues  to work in work boots 12 hours a day. She cannot get compression/4-layer under her work boots. 09/20/15 no major change. Periwound edema control was not very good. Her point with pain and vascular is next Wednesday the 25th 09/28/15; the patient is seen vascular surgery and is apparently scheduled for repeat duplex ultrasounds of her bilateral lower legs next week. 10/05/15; the patient was seen by Dr. Doren Custard of vascular surgery. He feels that she should have arterial insufficiency excluded as cause/contributed to her nonhealing stage she is therefore booked for an arteriogram. She has apparently monophasic signals in the dorsalis pedis pulses. She also of course has known severe chronic venous insufficiency with previous procedures as noted previously. I had another long discussion with the patient today about her continuing to work 12 hour shifts. I've written her out for 2 months area had concerns about this as her work location is currently undergoing significant turmoil and this may lead to her termination. She is aware of this however I agree with her that she simply cannot continue to stand for 12 hours multiple days a week with the substantial wound areas she has. 10/19/15; the Dr. Doren Custard appointment was largely for an arteriogram which was normal. She does not have an arterial issue. He didn't make a comment about her chronic venous insufficiency for which she has had previous ablations. Presumably it was not felt that anything additional could be done. The patient is now out of work as I prescribed 2 weeks ago. Her wounds look somewhat less aggravated presumably because of this. I felt I would give debridement another try today 10/25/15; no major change in this patient's wounds. We are  struggling to get her product that she can afford into her own home through her insurance. 11/01/15; no major change in the patient's wounds. I have been using silver alginate as the most affordable product. I spoke to Dr. Marla Roe last week with her requested take her to the OR for surgical debridement and placement of ACEL. Dr. Marla Roe told me that she would be willing to do this however [facility] will not cover this, fortunately the patient has [place] healthcare of some variant 11/08/15; no major change in the patient's wounds. She has been completely nonviable surface that this but is in too much pain with any attempted debridement are clinic. I have arranged for her to see Dr. Marla Roe ham of plastic surgery and this appointment is on Monday. I am hopeful that they will take her to the OR for debridement, possible ACEL ultimately possible skin graft 11/22/15 no major change in the patient's wounds over her bilateral medial calcaneus medial malleolus into the lower legs. Surface on these does not look too bad however on the left there is surrounding erythema and tenderness. This may be cellulitis or could him sleepy tinea. 11/29/15; no major changes in the patient's wounds over her bilateral medial malleolus. There is no infection here and I don't think any additional antibiotics are necessary. There is now plan to move forward. She sees Dr. Marla Roe in a week's time for preparation for operative debridement and ACEL placement I believe on 7/12. She then has a follow-up appointment with Dr. Marla Roe on 7/21 12/28/15; the patient returns today having been taken to the [facility] by Dr. Marla Roe 12/12/15 she underwent debridement, intraoperative cultures [which were negative]. She had placement of a wound VAC. Parent really ACEL was not available to be placed. The wound VAC foam apparently adhered to the wound since  then she's been using silver alginate, Xeroform under Ace wraps. She still  says there is a lot of drainage and a lot of pain 01/31/16; this is a patient I see monthly. I had referred her to Dr. Marla Roe him of plastic surgery for large wounds on her bilateral medial ankles. She has been to the OR twice once in early July and once in early August. She tells me over the last 3 weeks she has been using the wound VAC with ACEL underneath it. On the right we've simply been using silver alginate. Under Kerlix Coban wraps. 02/28/16; this is a patient I'm currently seeing monthly. She is gone on to have a skin graft over her large venous insufficiency ulcer on the left medial ankle. This was done by Dr. Marla Roe him. The patient is a bit perturbed about why she didn't have one on her right medial ankle wound. She has been using silver alginate to this. 03/06/16; I received a phone call from her plastic surgery Dr. Marla Roe. She expressed some concern about the viability of the skin graft she did on the left medial ankle wound. Asked me to place Endoform on this. She told me she is not planning to do a subsequent skin graft on the right as the left one did not take very well. I had placed Hydrofera Blue on the right 03/13/16; continue to have a reasonably healthy wound on the right medial ankle. Down to 3 mm in terms of size. There is epithelialization here. The area on the left medial ankle is her skin graft site. I suppose the last week this looks somewhat better. She has an open area inferiorly however in the center there appears to be some viable tissue. There is a lot of surface callus and eschar that will eventually need to come off however none of this looked to be infected. Patient states that the is able to keep the dressing on for several days which is an improvement. 03/20/16 no major change in the circumference of either wound however on the left side the patient was at Dr. Eusebio Friendly office and they did a debridement of left wound. 50% of the wound seems to be  epithelialized. I been using Endoform on the left Hydrofera Blue in the right 03/27/16; she arrives today with her wound is not looking as healthy as they did last week. The area on the right clearly has an adherent surface to this a very similar surface on the left. Unfortunately for this patient this is all too familiar problem. Clearly the Endoform is not working and will need to change that today that has some potential to help this surface. She does not tolerate debridement in this clinic very well. She is changing the dressing wants 04/03/16; patient arrives with the wounds looking somewhat better especially on the right. Dr. Migdalia Dk change the dressing to silver alginate when she saw her on Monday and also sold her some compression socks. The usefulness of the latter is really not clear and woman with severely draining wounds. 04/10/16; the patient is doing a bit of an experiment wearing the compression stockings that Dr. Migdalia Dk provided her to her left leg and the out of legs based dressings that we provided to the right. 05/01/16; the patient is continuing to wear compression stockings Dr. Migdalia Dk provided her on the left that are apparently silver impregnated. She has been using Iodoflex to the right leg wound. Still a moderate amount of drainage, when she leaves here the wraps  only last for 4 days. She has to change the stocking on the left leg every night 05/15/16; she is now using compression stockings bilaterally provided by Dr. Marla Roe. She is wearing a nonadherent layer over the wounds so really I don't think there is anything specific being done to this now. She has some reduction on the left wound. The right is stable. I think all healing here is being done without a specific dressing 06/09/16; patient arrives here today with not much change in the wound certainly in diameter to large circular wounds over the medial aspect of her ankle bilaterally. Under the light of these services are  certainly not viable for healing. There is no evidence of surrounding infection. She is wearing compression stockings with some sort of silver impregnation as prescribed by Dr. Marla Roe. She has a follow-up with her tomorrow. 06/30/16; no major change in the size or condition of her wounds. These are still probably covered with a nonviable surface. She is using only her purchase stockings. She did see Dr. Marla Roe who seemed to want to apply Dakin's solution to this I'm not extreme short what value this would be. I would suggest Iodoflex which she still has at home. 07/28/16; I follow Mrs. Lorence episodically along with Dr. Marla Roe. 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 She has been applying a topical collagen ointment to both wounds with Adaptic. I don't think Dr. Marla Roe is planning to take her back to the OR. 08/19/16; I follow Mrs. Jeneen Rinks on a monthly basis along with Dr. Marla Roe of plastic surgery. 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 I been following her for a number of years. At one point I was able to get the right medial malleolus wound to heal and had the left medial malleolus down to about half its current size however and I had to send her to plastic surgery for an operative debridement. Since then things have been stable to slightly improve the area on the right is slightly better one in the left about the same although there is much less adherent surface than I'm used to with this patient. She is using some form of liquid collagen gel that Dr. Marla Roe provided a Kerlix cover with the patient's own pressure stockings. She tells me that she has extreme pain in both ankles and along the lateral aspect of both feet. She has been unable to work for some period of time. She is telling me she is retiring at the beginning of April. She sees Dr.  Doran Durand of orthopedics next week 09/22/16; patient has not seen Dr. Marla Roe since the last time she is here. I'm not really sure what she is using to the wounds other than bits and pieces of think she had left over including most recently Hydrofera Blue. She is using juxtalite stockings. She is having difficulty with her husband's recent illness "stroke". She is having to transport him to various doctors appointments. Dr. Marla Roe left her the option of a repeat debridement with ACEL however she has not been able to get the time to follow-up on this. She continues to have a fair amount of drainage out of these wounds with certainly precludes leaving dressings on all week 10/13/16; patient has not seen Dr. Marla Roe since she was last in our clinic. I'm not really sure what she is doing with the wounds, we did try to get her  Hydrofera Blue and I think she is actually using this most of the time. Because of drainage she states she has to change this every second day although this is an improvement from what she used to do. She went to see Dr. Doran Durand who did not think she had a muscular issue with regards to her feet, he referred her to a neurologist and I think the appointment is sometime in June. I changed her back to Iodoflex which she has used in the past but not recently. 11/03/16; the patient has been using Iodoflex although she ran out of this. Still claims that there is a lot of drainage although the wound does not look like this. No surrounding erythema. She has not been back to see Dr. Marla Roe 11/24/16; the patient has been using Iodoflex again but she ran out of it 2 or 3 days ago. There is no major change in the condition of either one of these wounds in fact they are larger and covered in a thick adherent surface slough/nonviable tissue especially on the left. She does not tolerate mechanical debridement in our clinic. Going back to see Dr. Marla Roe of plastic surgery for an operative  debridement would seem reasonable. 12/15/16; the patient has not been back to see Dr. Marla Roe. She is been dealing with a series of illnesses and her husband which of monopolized her time. She is been using Sorbact which we largely supplied. She states the drainage is bad enough that it maximum she can go 2-3 days without changing the dressing 01/12/2017 -- the patient has not been back for about 4 weeks and has not seen Dr. Marla Roe not does she have any appointment pending. 01/23/17; patient has not seen Dr. Marla Roe even though I suggested this previously. She is using Santyl that was suggested last week by Dr. Con Memos this Cost her $16 through her insurance which is indeed surprising 02/12/17; continuing Santyl and the patient is changing this daily. A lot of drainage. She has not been back to see plastic surgery she is using an Ace wrap. Our intake nurse suggested wrap around stockings which would make a good reasonable alternative 02/26/17; patient is been using Santyl and changing this daily due to drainage. She has not been to see plastic surgery she uses in April Ace wrap to control the edema. She did obtain extremitease stockings but stated that the edema in her leg was to big for these 03/20/17; patient is using Santyl and Anasept. Surfaces looked better today the area on the right is actually measuring a little smaller. She has states she has a lot of pain in her feet and ankles and is asking for a consult to pain control which I'll try to help her with through our case manager. 04/10/17; the patient arrives with better-looking wound surfaces and is slightly smaller wound on the left she is using a combination of Santyl and Anasept. She has an appointment or at least as started in the pain control center associated with [facility] 05/14/17; this is a patient who I followed for a prolonged period of time. 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 At one  point I had this down to a much smaller wound on the left however these reopened and we've never been able to get these to heal. She has been using Santyl and Anasept gel although 2 weeks ago she ran out of the Anasept gel. She has a stable appearance of the wound. She is going to  the wound care clinic at [facility]. They wanted do a nerve block/spinal block although she tells me she is reluctant to go forward with that. 05/21/17; this is a patient I have followed for many years. 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 Chronic pain and deformity in her ankles as well. She is been to see plastic surgery as well as orthopedics. Using PolyMem AG most recently/Kerramax/ABDs and 2 layer compression. She has managed to keep this on and she is coming in for a nurse check to change the dressing on Tuesdays, we see her on Fridays 06/05/17; really quite a good looking surface and the area especially on the right medial has contracted in terms of dimensions. Well granulated healthy-looking tissue on both sides. Even with an open curet there is nothing that even feels abnormal here. This is as good as I've seen this in quite some time. We have been using PolyMem AG and bringing her in for a nurse check 06/12/17; really quite good surface on both of these wounds. The right medial has contracted a bit left is not. We've been using PolyMem and AG and she is coming in for a nurse visit 06/19/17; we have been using PolyMem AG and bringing her in for a nurse check. Dimensions of her wounds are not better but the surfaces looked better bilaterally. She complained of bleeding last night and the left wound and increasing pain bilaterally. She states her wound pain is more neuropathic than just the wounds. There was some suggestion that this was radicular from her pain management doctor in talking to her it is really difficult to sort this out. 06/26/17; using PolyMem and AG and bringing her  in for a nurse check as All of this and reasonably stable condition. Certainly not improved. The dimensions on the lateral part of the right leg look better but not really measuring better. The medial aspect on the left is about the same. 07/03/16; we have been using PolyMen AG and bringing her in for a nurse check to change the dressings as the wounds have drainage which precludes once weekly changing. We are using all secondary absorptive dressings.our intake nurse is brought up the idea of using a wound VAC/snap VAC on the wound to help with the drainage to see if this would result in some contraction. This is not a bad idea. The area on the right medial is actually looking smaller. Both wounds have a reasonable-looking surface. There is no evidence of cellulitis. The edema is well controlled 07/10/17; the patient was denied for a snap VAC by her insurance. The major issue with these wounds continues to be drainage. We are using wicked PolyMem AG and she is coming in for a nurse visit to change this. The wounds are stable to slightly improved. The surface looks vibrant and the area on the right certainly has shrunk in size but very slowly 07/17/17; the patient still has large wounds on her bilateral medial malleoli. Surface of both of these wounds looks better. The dimensions seem to come and go but no consistent improvement. There is no epithelialization. We do not have options for advanced treatment products due to insurance issues. They did not approve of the wound VAC to help control the drainage. More recently we've been using PolyMem and AG wicked to allow drainage through. We have been bringing her in for a nurse visit to change this. We do not have a lot of options for wound care products and  the home again due to insurance issues 07/24/17; the patient's wound actually looks somewhat better today. No drainage measurements are smaller still healthy-looking surface. We used silver collagen under  PolyMen started last week. We have been bringing her in for a dressing change 07/31/17; patient's wound surface continued to look better and I think there is visible change in the dimensions of the wound on the right. Rims of epithelialization. We have been using silver collagen under PolyMen and bringing her in for a dressing change. There appears to be less drainage although she is still in need of the dressing change 08/07/17. Patient's wound surface continues to look better on both sides and the area on the right is definitely smaller. We have been using silver collagen and PolyMen. She feels that the drainage has been it has been better. I asked her about her vascular status. She went to see Dr. Aleda Grana at [facility] and had some form of ablation. I don't have much detail on this. I haven't my notes from 2016 that she was not a candidate for any further ablation but I don't have any more information on this. We had referred her to vein and vascular I don't think she ever went. He does not have a history of PAD although I don't have any information on this either. We don't even have ABIs in our record 08/14/17; we've been using silver collagen and PolyMen cover. And putting the patient and compression. She we are bringing her in as a nurse visit to change this because ofarge amount of drainage. We didn't the ABIs in clinic today since they had been done in many moons 1.2 bilaterally. She has been to see vein and vascular however this was at [facility] and she had ablation although I really don't have any information on this all seemed biking get a report. She is also been operatively debrided by plastic surgery and had a cell placed probably 8-12 months ago. This didn't have a major effect. We've been making some gains with current dressings 08/19/17-She is here in follow-up evaluation for bilateral medial malleoli ulcers. She continues to tolerate debridement very poorly. We will continue  with recently changed topical treatment; if no significant improvement may consider switching to Iodosorb/Iodoflex. 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 She has been using silver collagen and PolyMem. I believe she has been used and tried on Iodoflex before. 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 These have been very refractory over time. We have been using silver collagen and PolyMen. She is coming in once a week for a doctor's and once a week for nurse visits. We are actually making some progress 09/18/17; the patient's wounds are smaller especially on the right medial. She arrives today to upset to consider even washing these off with Anasept which I think is been part of the reason this is been closing. We've been using collagen covered in PolyMen otherwise. It is noted that she has a small area of folliculitis on the right medial calf that. As we are wrapping her legs I'll give her a short course of doxycycline to make sure this doesn't amount to anything. She is a long list of complaints today including imbalance, shortness of breath on exertion, inversion of her left ankle. With  regards to the latter complaints she is been to see orthopedics and they offered her a tendon release surgery I believe but wanted her wounds to be closed first. I have recommended she go see her primary physician with regards to everything else. 09/25/17; patient's wounds are about the same size. We have made some progress bilaterally although not in recent weeks. She will not allow me T wash these wounds with Anasept even if she is doing her cell. Wheeze we've been using collagen covered in PolyMen. Last week she had a small area of folliculitis this is now opened into a  small wound. She completed 5 days of trimethoprim sulfamethoxazole 10/02/17; unfortunately the area on her left medial ankle is worse with a larger wound area towards the Achilles. The patient complains of a lot of pain. She will not allow debridement although visually I don't think there is anything to debridement in any case. We have been using silver collagen and PolyMen for several months now. Initially we are making some progress although I'm not really seeing that today. We will move back to [facility]. His admittedly this is a bit of a repeat however I'm hoping that his situation is different now. The patient tells me she had her leg on the left give out on her yesterday this is process some pain. 10/09/17; the patient is seen twice a week largely because of drainage issues coming out of the chronic medial bimalleolar wounds that are chronic. Last week the dimensions of the one on the left looks a little larger I changed her to [facility]. She comes in today with a history of terrible pain in the bilateral wound areas. She will not allow debridement. She will not even allow a tissue culture. There is no surrounding erythema no no evidence of cellulitis. We have been putting her Kerlix Coban man. She will not allow more aggressive compression as there was a suggestion to put her in 3 layer wraps. 10/16/17; large wounds on her bilateral medial malleoli. These are chronic. Not much change from last week. The surface looks have healthy but absolutely no epithelialization. A lot of pain little less so of drainage. She will not allow debridement or even washing these off in the vigorous fashion with Anasept. 10/23/17; large wounds on her bilateral malleoli which are chronic. Some improvement in terms of size perhaps on the right since last time I saw these. She states that after we increased the 3 layer compression there was some bleeding, when she came in for a nurse visit she did not want 3 layer  compression put back on about our nurse managed to convince her. She has known chronic venous visit issues and I'm hoping to get her to tolerate the 3 layer compression. using Hydrofera Blue 10/30/17; absolutely no change in the condition of either wound although we've had some improvement in dimensions on the right.. Attempted to put her in 3 layer compression she didn't tolerated she is back in 2 layer compression. We've been using Hydrofera Blue We looked over her past records. She had venous reflux studies in November 2016. There was no evidence of deep venous reflux on the right. Superficial vein did not show the greater saphenous vein at think this is been previously ablated the small saphenous vein was within normal limits. The left deep venous system showed no DVT the vessels were positive for deep venous reflux in the posterior tibial veins at the ankle. The greater saphenous vein was surgically absent  small saphenous vein was within normal limits. She went to vein and vascular at [facility]. I believe she had an ablation on the left greater saphenous vein. I'll update her reflux studies perhaps ever reviewed by vein and vascular. We've made absolutely no progress in these wounds. Will also try to read and TheraSkins through her insurance 11/06/17; W the patient apparently has a 2 week follow-up with vein and vascular I like him to review the whole issue with regards to her previous vascular workup by Dr. Aleda Grana. We've really made no progress on these wounds in many months. She arrives today with less viable looking surface on the left medial ankle wound. This was apparently looking about the same on Tuesday when she was here for nurse visit. 11/13/17; deep tissue culture I did last time of the left lower leg showed multiple organisms without any predominating. In particular no Staphylococcus or group A strep were isolated. We sent her for venous reflux studies. She's had a previous  left greater saphenous vein stripping and I think sclerotherapy of the right greater saphenous vein. She didn't really look at the lesser saphenous vein this both wounds are on the medial aspect. She has reflux in the common femoral vein and popliteal vein and an accessory vein on the right and the common femoral vein and popliteal vein on the left. I'm going to have her go to see vein and vascular just the look over things and see if anything else beside aggressive compression is indicated here. We have not been able to make any progress on these wounds in spite of the fact that the surface of the wounds is never look too bad. 11/20/17; no major change in the condition of the wounds. Patient reports a large amount of drainage. She has a lot of complaints of pain although enlisting her today I wonder if some of this at least his neuropathic rather than secondary to her wounds. She has an appointment with vein and vascular on 12/30/17. The refractory nature of these wounds in my mind at least need vein and vascular to look over the wounds the recent reflux studies we did and her history to see if anything further can be done here. I also note her gait is deteriorated quite a bit. 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 She has a bilateral Trendelenburg gait. I wonder if this is neuropathic or perhaps multilevel radicular. 11/27/17; her wounds actually looks slightly better. Healthy-looking granulation tissue a scant amount of epithelialization. [place] healthcare will not pay for [employer]. They will play for tri layer Oasis and Dermagraft. This is not a diabetic ulcer. We'll try for the tri layer Oasis. She still complains of some drainage. She has a vein and vascular appointment on 12/30/17 12/04/17; the wounds visually look quite good. Healthy-looking granulation with some degree of epithelialization. We are still waiting for response to our request for trial to try layer Oasis. Her  appointment with vascular to review venous and arterial issues isn't sold the end of July 7/31. Not allow debridement or even vigorous cleansing of the wound surface. 12/18/17; slightly smaller especially on the right. 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 We've been using Hydrofera Blue. We still are looking into triple layer Oasis through her insurance 01/08/18 on evaluation today patient's wound actually appears to be showing signs of good improvement at this point in time. She has been tolerating the dressing changes without complication. Fortunately there does not appear  to be any evidence of infection at this point in time. We have been utilizing silver nitrate which does seem to be of benefit for her which is also good news. Overall I'm very happy with how things seem to be both regards appearance as well as measurement. Patient did see Dr. Bridgett Larsson for evaluation on 12/30/17. In his assessment he felt that stripping would not likely add much more than chronic compression to the patient's healing process. His recommendation was to follow-up in three months with Dr. Doren Custard if she hasn't healed in order to consider referral back to you and see vascular where she previously was in a trial and was able to get her wound to heal. I'll be see what she feels she when you staying compression and he reiterated this as well. 01/13/18 on evaluation today patient appears to actually be doing very well in regard to her bilateral medial malleolus ulcers. She seems to have tolerated the chemical cauterization with silver nitrate last week she did have some pain through that evening but fortunately states that I'll be see since it seems to be doing better she is overall pleased with the progress. 01/21/18; really quite a remarkable improvement since I've last seen these wounds. We started using silver nitrate specially on the islands of hyper granulation which for some reason her around  the wound circumference. This is really done quite nicely. Primary dressing Hydrofera Blue under 4 layer compression. She seems to be able to hold out without a nurse rewrap. Follow-up in 1 week 01/28/18; we've continued the hydrofera blue but continued with chemical cauterization to the wound area that we started about a month ago for irregular hyper granulation. She is made almost stunning improvement in the overall wound dimensions. I was not really expecting this degree of improvement in these chronic wounds 02/05/18; we continue with Hydrofera Bluebut of also continued the aggressive chemical cauterization with silver nitrate. We made nice progress with the right greater than left wound. 02/12/18. We continued with Hydrofera Blue after aggressive chemical cauterization with silver nitrate. We appear to be making nice progress with both wound areas 02/19/2018; we continue with [facility] after washing the wounds vigorously with Anasept spray and chemical cauterization with silver nitrate. We are making excellent progress. The area on the right's just about closed 02/26/2018. The area on the left medial ankle had too much necrotic debris today. I used a #5 curette we are able to get most of the soft. I continued with the silver nitrate to the much smaller wound on the right medial ankle she had a new area on her right lower pretibial area which she says was due to a role in her compression 03/05/2018; both wound areas look healthy. Not much change in dimensions from last week. I continue to use silver nitrate and Hydrofera Blue. The patient saw Dr. Doren Custard of vein and vascular. He felt she had venous stasis ulcers. He felt based on her previous arteriogram she should have adequate circulation for healing. Also she has deep venous reflux but really no significant correctable superficial venous reflux at this time. He felt we should continue with conservative management including leg elevation and  compression 04/02/2018; since we last saw this woman about a month ago she had a fall apparently suffered a pelvic fracture. I did not look up the x-ray. Nevertheless because of pain she literally was bedbound for 2 weeks and had home health coming out to change the dressing. Somewhat predictably this is resulted  in considerable improvement in both wound areas. The right is just about closed on the medial malleolus and the left is about half the size. 04/16/2018; both her wounds continue to go down in size. Using Hydrofera Blue. 05/07/18; both her wounds appeared to be improving especially on the right where it is almost closed. We are using Hydrofera Blue 05/14/2018; slightly worse this week with larger wounds. Surface on the left medial not quite as good. We have been using Hydrofera Blue 05/21/18; again the wounds are slightly larger. Left medial malleolus slightly larger with eschar around the circumference. We have been using Hydrofera Blue undergoing a wraps for a prolonged period of time. This got a lot better when she was more recumbent due to a fall and a back injury. I change the primary dressing the silver alginate today. She did not tolerate a 4 layer compression previously although I may need to bring this up with her next time 05/28/2018; area on the left medial malleolus again is slightly larger with more drainage. Area on the right is roughly unchanged. She has a small area of folliculitis on the right medial just on the lower calf. This does not look ominous. 06/03/2018 left medial malleolus slightly smaller in a better looking surface. We used silver nitrate on this last time with silver alginate. The area on the right appears slightly smaller 1/10; left medial malleolus slightly smaller. Small open area on the right. We used silver nitrate and silver alginate as of 2 weeks ago. We continue with the wound and compression. These got a lot better when she was off her feet 1/17; right  medial malleolus wound is smaller. The left may be slightly smaller. Both surfaces look somewhat better. 1/24; both wounds are slightly smaller. Using silver alginate under Unna boots 1/31; both wounds appear smaller in fact the area on the right medial is just about closed. Surface eschar. We have been using silver alginate under Unna boots. The patient is less active now spends let much less time on her feet and I think this is contributed to the general improvement in the wound condition 2/7; both wounds appear smaller. I was hopeful the right medial would be closed however there there is still the same small open area. Slight amount of surface eschar on the left the dimensions are smaller there is eschar but the wound edges appear to be free. We have been using silver alginate under Unna boot's 2/14; both wounds once again measure smaller. Circumferential eschar on the left medial. We have been using silver alginate under Unna boots with gradual improvement 2/21; the area on the right medial malleolus has healed. The area on the left is smaller. We have been using silver alginate and Unna boots. We can discharge wrapping the right leg she has 20/30 stockings at home she will need to protect the scar tissue in this area 2/28; the area on the right medial malleolus remains closed the patient has a compression stocking. The area on the left is smaller. We have been using silver alginate and Unna boots. 3/6 the area on the right medial ankle remains closed. Good edema control noted she is using her own compression stocking. The area on the left medial ankle is smaller. We have been managing this with silver alginate and Unna boots which we will continue today. 3/13; the area on the right medial ankle remains closed and I'm declaring it healed today. When necessary the left is about the same still a healthy-looking  surface but no major change and wound area. No evidence of infection and using silver  alginate under unna and generally making considerable improvement 3/27 the area on the right medial ankle remains closed the area on the left is about the same as last week. Certainly not any worse we have been using silver alginate under an Unna boot 4/3; the area on the right medial ankle remains closed per the patient. We did not look at this wound. The wound on the left medial ankle is about the same surface looks healthy we have been using silver alginate under an Unna boot 4/10; area on the right medial ankle remains closed per the patient. We did not look at this wound. The wound on the left medial ankle is slightly larger. The patient complains that the [facility] caused burning pain all week. She also told us that she was a lot more active this week. Changed her back to silver alginate 4/17; right medial ankle still closed per the patient. Left medial ankle is slightly larger. Using silver alginate. She did not tolerate Hydrofera Blue on this area 4/24; right medial ankle remains closed we have not look at this. The left medial ankle continues to get larger today by about a centimeter. We have been using silver alginate under Unna boots. She complains about 4 layer compression as an alternative. She has been up on her feet working on her garden 5/8; right medial ankle remains closed we did not look at this. The left medial ankle has increased in size about 100%. We have been using silver alginate under Unna boots. She noted increased pain this week and was not surprised that the wound is deteriorated 5/15; no major change in SA however much less erythema ( one week of doxy ocellulitis). 5/22-65 year old female returns at 1 week to the clinic for left medial ankle wound for which we have been using silver alginate under 3 layer compression She was placed on DOXY at last visit - the wound is wider at this visit. She is in 3 layer compression 5/29; change to [facility] last week. I had  given her empiric doxycycline 2 weeks ago for a week. She is in 3 layer compression. She complains of a lot of pain and drainage on presentation today. 6/5; using Hydrofera Blue. I gave her doxycycline recently empirically for erythema and pain around the wound. Believe her cultures showed enterococcus which not would not have been well covered by doxycycline nevertheless the wound looks better and I don't feel specifically that the enterococcus needs to be covered. She has a new what looks like a wrap injury on her lateral left ankle. 6/12; she is using Hydrofera Blue. She has a new area on the left anterior lower tibial area. This was a wrap injury last week. 6/19; the patient is using Hydrofera Blue. She arrived with marked inflammation and erythema around the wound and tenderness. 12/01/18 on evaluation today patient appears to be doing a little bit better based on what I'm hearing from the standpoint of lassos evaluation to this as far as the overall appearance of the wound is concerned. Then sometime substandard she typically sees Dr. Dellia Nims. Nonetheless overall very pleased with the progress that she's made up to this point. No fevers, chills, nausea, or vomiting noted at this time. 7/10; some improvement in the surface area. Aggressively debrided last week apparently. I went ahead with the debridement today although the patient does not tolerate this very well. We have  been using Iodoflex. Still a fair amount of drainage 7/17; slightly smaller. Using Iodoflex. 7/24; no change from last week in terms of surface area. We have been using Iodoflex. Surface looks and continues to look somewhat better 7/31; surface area slightly smaller better looking surface. We have been using Iodoflex. This is under Unna boot compression 8/7-Patient presents at 1 week with Unna boot and Iodoflex, wound appears better 8/14-Patient presents at 1 week with Iodoflex, we use the Unna boot, wound appears to be stable  better.Patient is getting Botox treatment for the inversion of the foot for tendon release, Next week 8/21; we are using Iodoflex. Unna boot. The wound is stable in terms of surface area. Under illumination there is some areas of the wound that appear to be either epithelialized or perhaps this is adherent slough at this point I was not really clear. It did not wipe off and I was reluctant to debride this today. 8/28; we are using Iodoflex in an Unna boot. Seems to be making good improvement. 9/4; using Iodoflex and wound is slightly smaller. 9/18; we are using Iodoflex with topical silver nitrate when she is here. The wound continues to be smaller 10/2; patient missed her appointment last week due to GI issues. She left and Iodoflex based dressing on for 2 weeks. Wound is about the same size about the size of a dime on the left medial lower 10/9 we have been using Iodoflex on the medial left ankle wound. She has a new superficial probable wrap injury on the dorsal left ankle 10/16; we have been using Hydrofera Blue since last week. This is on the left medial ankle 10/23; we have been using Hydrofera Blue since 2 weeks ago. This is on the left medial ankle. Dimensions are better 11/6; using Hydrofera Blue. I think the wound is smaller but still not closed. Left medial ankle 11/13; we have been using Hydrofera Blue. Wound is certainly no smaller this week. Also the surface not as good. This is the remanent of a very large area on her left medial ankle. 11/20; using Sorbact since last week. Wound was about the same in terms of size although I was disappointed about the surface debris 12/11; 3-week follow-up. Patient was on vacation. Wound is measuring slightly larger we have been using Sorbact. 12/18; wound is about the same size however surface looks better last week after debridement. We have been using Sorbact under compression 1/15 wound is probably twice the size of last time increased in length  nonviable surface. We have been using Sorbact. She was running a mild fever and missed her appointment last week 1/22; the wound is come down in size but under illumination still a very adherent debris we have been Hydrofera Blue that I changed her to last week 1/29; dimensions down slightly. We have been using Hydrofera Blue 2/19 dimensions are the same however there is rims of epithelialization under illumination. Therefore more the surface area may be epithelialized 2/26; the patient's wound actually measures smaller. The wound looks healthy. We have been using Hydrofera Blue. I had some thoughts about running Apligraf then I still may do that however this looks so much better this week we will delay that for now 3/5; the wound is small but about the same as last week. We have been using Hydrofera Blue. No debridement is required today. 3/19; the wound is about the size of a dime. Healthy looking wound even under illumination. We have been using Hydrofera Blue. No  mechanical debridement is necessary 3/26; not much change from last week although still looks very healthy. We have been using Hydrofera Blue under Unna boots Patient was offered an ankle fusion by podiatry but not until the wound heals with a proceed with this. 4/9; the patient comes in today with her original wound on the medial ankle looking satisfactory however she has some uncontrolled swelling in the middle part of her leg with 2 new open areas superiorly just lateral to the tibia. I think this was probably a wrap issue. She said she felt uncomfortable during the week but did not call in. We have been using Hydrofera Blue 4/16; the wound on the medial ankle is about the same. She has innumerable small areas superior to this across her mid tibia. I think this is probably folliculitis. She is also been working in the yard doing a lot of sweating 4/30; the patient issue on the upper areas across her mid tibia of all healed. I think  this was excessive yard work if I remember. Her wound on the medial ankle is smaller. Some debris on this we have been using Hydrofera Blue under Unna boots 5/7; mid tibia. She has been using Hydrofera Blue under an Unna wrap. She is apparently going for her ankle surgery on June 3 10/28/19-Patient returns to clinic with the ankle wound, we are using Hydrofera Blue under Unna wrap, surgery is scheduled for her left foot for June 3 so she will be back for nurse visit next week READMISSION 01/17/2020 Mrs. Boden is a 65 year old woman we have had in this clinic for a long period of time with severe venous hypertension and refractory wounds on her medial lower legs and ankles bilaterally. 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 When she was off her legs for a prolonged period example when she fell and suffered a compression fracture things would heal up quite nicely. She is now retired and we managed to heal up the right medial leg wound. The left one was very tiny last time I saw this although still refractory. 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 It got to the point where this was interfering with her walking and she elected to proceed with a ankle arthrodesis to straighten her her ankle and leave her with a functional outcome for mobilization. The patient was referred to Dr. Doren Custard and really this took some time to arrange. Dr. Doren Custard saw her on 12/07/2019. Once again he verified that she had no arterial issues. She had previously had an angiogram several years ago. Follow-up ABIs on the left showed an ABI of 1.12 with triphasic waveforms and a TBI of 0.92. She is felt to have chronic deep venous insufficiency but I do not think it was felt that anything could be done from about this from  an ablation point of view. At the time Dr. Doren Custard saw this patient the wounds actually look closed via the pictures in his clinic. The patient finally underwent her surgery on 12/15/2019. This went reasonably well and there was a good anatomic outcome. She developed a small distal wound dehiscence on the lateral part of the surgical wound. However more problematically she is developed recurrence of the wound on the medial left ankle. There are actually 2 wounds here  one in the distal lower leg and 1 pretty much at the level of the medial malleolus. It is a more distal area that is more problematic. She has been using Hydrofera Blue which started on Friday before this she was simply Ace wrapping. There was a culture done that showed Pseudomonas and she is on ciprofloxacin. A recent CNS on 8/11 was negative. The patient reports some pain but I generally think this is improving. She is using a cam boot completely nonweightbearing using a walker for pivot transfers and a wheelchair 8/24; not much improvement unfortunately she has a surgical wound on the lateral part in the venous insufficiency wound medially. The bottom part of the medial insufficiency wound is still necrotic there is exposed tendon here. We have been using Hydrofera Blue under compression. Her edema control is however better 8/31; patient in for follow-up of his surgical wound on the lateral part of her left leg and chronic venous insufficiency ulcers medially. We put her back in compression last week. She comes in today with a complaint of 3 or 4 days worth of increasing pain. She felt her cam walker was rubbing on the area on the back of her heel. However there is intense erythema seems more likely she has cellulitis. She had 2 cultures done when she was seeing podiatry in the postop. One of them in late July showed Pseudomonas and she received a course of ciprofloxacin the other was negative on 8/11 she is allergic to penicillin  with anaphylactoid complaints of hives oral swelling via information in epic 9/9; when I saw this patient last week she had intense anterior erythema around her wound on the right lateral heel and ankle and also into the right medial heel. Some of this was no doubt drainage and her walker boot however I was convinced she had cellulitis. I gave her Levaquin and Bactrim she is finishing up on this now. She is following up with Dr. [name] he saw her yesterday. He is taken her out of the walking boot of course she is still nonweightbearing. Her x-ray was negative for any worrisome features such as soft tissue air etc. Things are a lot better this week. She has home health. We have been using Hydrofera Blue under an [employer] which she put back on yesterday. I did not wrap her last week 9/17; her surrounding skin looks a lot better. In fact the area on the left lateral ankle has just a scant amount of eschar. The only remaining wound is the large area on the left medial ankle. Probably about 60% of this is healthy granulation at the surface however she has a significant divot distally. This has adherent debris in it. I been using debridement and silver collagen to try and get this area to fill-in although I do not think we have made much progress this week 9/24; the patient's wound on the left medial ankle looks a lot better. The deeper divot area distally still requires debridement but this is cleaning up quite nicely we have been using silver collagen. The patient is complaining of swelling in her foot and is worried that that is contributing to the nonhealing of the ankle wound. She is also complaining of numbness in her anterior toes 10/4; left medial ankle. The small area distally still has a divot with necrotic material that I have been debriding away. This has an undermining area. She is approved for Apligraf. She saw Dr. [name] her surgeon on 10/1. I think he  declared himself is satisfied with the  condition of things. Still nonweightbearing till the end of the month. 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 Her surgical wound is well closed. There is no evidence of infection 10/11; the patient arrived in clinic today with the expectation that we be able to put an Apligraf on this area after debridement however she arrives with a relatively large amount of green drainage on the dressing. The patient states that this started on Friday. She has not been systemically unwell. 10/19; culture I did last week showed both Enterococcus and Pseudomonas. I think this came in separate parts because I stopped her ciprofloxacin I gave her and prescribed her linezolid however now looking at the final culture result this is Pseudomonas which is resistant to quinolones. She has not yet picked up the linezolid apparently phone issues. We are also trying to get a topical antibiotic out of [facility] in [place] they can be applied by home health. She is still having green drainage 10/16; the patient has her topical antibiotic from [facility] in [place]. This is a compounded gel with vancomycin and ciprofloxacin and gentamicin. We are applying this on the wound bed with silver alginate over the top with Unna boot wraps. She arrives in clinic today with a lot less ominous looking drainage although she is only use this topical preparation once the second time today. She sees Dr. [name] her surgeon on Friday she has home health changing the dressing 11/2; still using her compounded topical antibiotic under silver alginate. Surface is cleaning up there is less drainage. We had an Apligraf for her today and I elected to apply it. A light coating of her antibiotic 04/25/2020 upon evaluation today patient appears to be doing well with regard to her ankle ulcer. There is a little bit of slough noted on the surface of the wound I am can have to perform sharp debridement to clear  this away today. With that being said other than that fact overall I feel like she is making progress and we do see some new epithelial growth. There is also some improvement in the depth of the wound and that distal portion. There is little bit of slough there as well. 12/7; 2-week follow-up. Apligraf #3. Dimensions are smaller. Closing in especially inferiorly. Still some surface debris. Still using the [employer] topical antibiotic but I told her that I don't think this needs to be renewed 12/21; 2-week follow-up. Apligraf #4 dimensions are smaller. Nice improvement 06/05/2020; 2-week follow-up. The patient's wound on the left medial ankle looks really excellent. Nice granulation. Advancing epithelialization no undermining no evidence of infection. We would have to reapply for another Apligraf but with the condition of this wound I did not feel strongly about it. We used Hydrofera Blue under the same degree of compression. She follows up with Dr. [name] her surgeon a week Friday 06/13/2020 upon evaluation today patient appears to be doing excellent in regard to her wound. She has been tolerating the dressing changes without complication. Fortunately there is no signs of active infection at this time. No fevers, chills, nausea, vomiting, or diarrhea. She was using Hydrofera Blue last week. 06/20/2020 06/20/2020 on evaluation today patient actually appears to be doing excellent in regard to her wound. This is measuring better and looking much better as well. She has been using the collagen that seems to be doing better for her as well even though the [facility] was and is  not sticking or feeling as rough on her wound. She did see Dr. [name] on Friday he is very pleased he also stated none of the hardware has shifted. That is great news 1/27; the patient has a small clean wound all that is remaining. I agree that this is too small to really consider an Apligraf. Under illumination the surface is looking  quite good. We have been using collagen although the dimensions are not any better this week 3/2; the patient has a small clean wound on the left medial ankle. Although this left of her substantial original areas. Measurements are smaller. We have been using polymen Ag under an Haematologist. Objective Constitutional Sitting or standing Blood Pressure is within target range for patient.. Pulse regular and within target range for patient.Marland Kitchen Respirations regular, non-labored and within target range.. Temperature is normal and within the target range for the patient.Marland Kitchen Appears in no distress. Vitals Time Taken: 9:47 AM, Height: 68 in, Temperature: 98.2 F, Pulse: 71 bpm, Respiratory Rate: 17 breaths/min, Blood Pressure: 123/79 mmHg. Cardiovascular Fetal pulses are palpable. Edema control is adequate. General Notes: Wound exam; the wound was debrided with Anasept and gauze. Healthy looking granulation. No surrounding infection. Depth perhaps 7 mm Integumentary (Hair, Skin) Wound #15 status is Open. Original cause of wound was Gradually Appeared. The wound is located on the Left,Medial Malleolus. The wound measures 1.1cm length x 0.6cm width x 0.1cm depth; 0.518cm^2 area and 0.052cm^3 volume. There is Fat Layer (Subcutaneous Tissue) exposed. There is no tunneling or undermining noted. There is a medium amount of serosanguineous drainage noted. The wound margin is flat and intact. There is large (67-100%) red granulation within the wound bed. There is no necrotic tissue within the wound bed. Assessment Active Problems ICD-10 Chronic venous hypertension (idiopathic) with ulcer and inflammation of left lower extremity Non-pressure chronic ulcer of other part of left lower leg with other specified severity Non-pressure chronic ulcer of left ankle with other specified severity Procedures Wound #15 Pre-procedure diagnosis of Wound #15 is a Venous Leg Ulcer located on the Left,Medial Malleolus . There was a  Haematologist Compression Therapy Procedure by Baruch Gouty, RN. Post procedure Diagnosis Wound #15: Same as Pre-Procedure Plan Follow-up Appointments: Return Appointment in 1 week. Bathing/ Shower/ Hygiene: May shower with protection but do not get wound dressing(s) wet. Edema Control - Lymphedema / SCD / Other: Elevate legs to the level of the heart or above for 30 minutes daily and/or when sitting, a frequency of: Avoid standing for long periods of time. Exercise regularly Compression stocking or Garment 20-30 mm/Hg pressure to: - right leg daily WOUND #15: - Malleolus Wound Laterality: Left, Medial Cleanser: Soap and Water 1 x Per Week/15 Days Discharge Instructions: May shower and wash wound with dial antibacterial soap and water prior to dressing change. Peri-Wound Care: Triamcinolone 15 (g) 1 x Per Week/15 Days Discharge Instructions: Use triamcinolone 15 (g) as directed Peri-Wound Care: Sween Lotion (Moisturizing lotion) 1 x Per Week/15 Days Discharge Instructions: Apply moisturizing lotion as directed Prim Dressing: PolyMem Silver Non-Adhesive Dressing, 4.25x4.25 in 1 x Per Week/15 Days ary Discharge Instructions: Apply to wound bed as instructed Secondary Dressing: Woven Gauze Sponge, Non-Sterile 4x4 in (Home Health) 1 x Per Week/15 Days Discharge Instructions: Apply over primary dressing as directed. Secondary Dressing: ABD Pad, 5x9 (Home Health) 1 x Per Week/15 Days Discharge Instructions: Apply over primary dressing as directed. Com pression Wrap: Unnaboot w/Calamine, 4x10 (in/yd) (Home Health) 1 x Per Week/15 Days Discharge Instructions:  Apply Unnaboot as directed. 1. We are still going to use polymen under her Unna boot. Careful attention to dimensions. The surface of the granulation looks healthy Electronic Signature(s) Signed: 07/05/2020 4:57:15 PM By: Linton Ham MD Entered By: Linton Ham on 07/05/2020  10:36:39 -------------------------------------------------------------------------------- SuperBill Details Patient Name: Date of Service: [name], [name] 07/05/2020 Medical Record Number: [id number] Patient Account Number: [id number] Date of Birth/Sex: Treating RN: 1955-06-08 (65 y.o. [name], Meta.Reding Primary Care Provider: [name] Other Clinician: Referring Provider: Treating Provider/Extender: [name] in Treatment: 24 Diagnosis Coding ICD-10 Codes Code Description [phone] Chronic venous hypertension (idiopathic) with ulcer and inflammation of left lower extremity L97.828 Non-pressure chronic ulcer of other part of left lower leg with other specified severity L97.328 Non-pressure chronic ulcer of left ankle with other specified severity Facility Procedures CPT4 Code: [id number] Description: (Facility Use Only) [phone] - [place] LWR LT LEG Modifier: Quantity: 1 Physician Procedures : CPT4 Code Description Modifier [id number] - WC PHYS LEVEL 3 - EST PT ICD-10 Diagnosis Description I87.332 Chronic venous hypertension (idiopathic) with ulcer and inflammation of left lower extremity L97.328 Non-pressure chronic ulcer of left ankle  with other specified severity Quantity: 1 Electronic Signature(s) Signed: 07/05/2020 4:57:15 PM By: Linton Ham MD Entered By: Linton Ham on 07/05/2020 10:37:06

## 2020-07-09 NOTE — Progress Notes (Signed)
LATIFAH, PADIN ([id number]) Visit Report for 07/05/2020 Arrival Information Details Patient Name: Date of Service: [facility], [name] 07/05/2020 9:30 A M Medical Record Number: [id number] Patient Account Number: [id number] Date of Birth/Sex: Treating RN: 01-05-1956 (65 y.o. [name], [name] Primary Care Jaquis Picklesimer: [name] Other Clinician: Referring Anyjah Roundtree: Treating Kasiya Burck/Extender: [name] in Treatment: 24 Visit Information History Since Last Visit Added or deleted any medications: No Patient Arrived: [name] Any new allergies or adverse reactions: No Arrival Time: 09:47 Had a fall or experienced change in No Accompanied By: self activities of daily living that may affect Transfer Assistance: None risk of falls: Patient Identification Verified: Yes Signs or symptoms of abuse/neglect since last visito No Secondary Verification Process Completed: Yes Hospitalized since last visit: No Patient Requires Transmission-Based Precautions: No Implantable device outside of the clinic excluding No Patient Has Alerts: Yes cellular tissue based products placed in the center Patient Alerts: L ABI =1.12, TBI = .92 since last visit: R ABI= 1.02, TBI= .58 Has Dressing in Place as Prescribed: Yes Pain Present Now: Yes Electronic Signature(s) Signed: 07/09/2020 9:06:40 AM By: [name] Entered By: [name] on 07/05/2020 09:47:52 -------------------------------------------------------------------------------- Compression Therapy Details Patient Name: Date of Service: [name], [name] 07/05/2020 9:30 A M Medical Record Number: [id number] Patient Account Number: [id number] Date of Birth/Sex: Treating RN: 1955/07/02 (65 y.o. [name] Primary Care Charika Mikelson: [name] Other Clinician: Referring Noor Vidales: Treating Hisham Provence/Extender: [name] in Treatment: 24 Compression Therapy Performed for Wound Assessment:  Wound #15 Left,Medial Malleolus Performed By: Clinician Baruch Gouty, RN Compression Type: Rolena Infante Post Procedure Diagnosis Same as Pre-procedure Electronic Signature(s) Signed: 07/05/2020 5:30:16 PM By: Deon Pilling Entered By: Deon Pilling on 07/05/2020 10:27:36 -------------------------------------------------------------------------------- Encounter Discharge Information Details Patient Name: Date of Service: [facility] [name], [name] 07/05/2020 9:30 A M Medical Record Number: [id number] Patient Account Number: [id number] Date of Birth/Sex: Treating RN: 07-18-1955 (65 y.o. [name] Abri Vacca: [name] Other Clinician: Referring Jahmad Petrich: Treating Maxxon Schwanke/Extender: [name] in Treatment: 24 Encounter Discharge Information Items Discharge Condition: Stable Ambulatory Status: Cane Discharge Destination: Home Transportation: Private Auto Accompanied By: self Schedule Follow-up Appointment: Yes Clinical Summary of Care: Patient Declined Electronic Signature(s) Signed: 07/05/2020 5:53:40 PM By: Baruch Gouty RN, BSN Entered By: Baruch Gouty on 07/05/2020 10:40:07 -------------------------------------------------------------------------------- Lower Extremity Assessment Details Patient Name: Date of Service: [facility] [name], [name] 07/05/2020 9:30 A M Medical Record Number: [id number] Patient Account Number: [id number] Date of Birth/Sex: Treating RN: 22-Feb-1956 (65 y.o. [name] Primary Care Jalisia Puchalski: [name] Other Clinician: Referring Aleph Nickson: Treating Orrin Yurkovich/Extender: [name] in Treatment: 24 Edema Assessment Assessed: [Left: No] [Right: No] Edema: [Left: N] [Right: o] Calf Left: Right: Point of Measurement: From Medial Instep 28 cm Ankle Left: Right: Point of Measurement: From Medial Instep 20.5 cm Vascular Assessment Pulses: Dorsalis Pedis Palpable:  [Left:Yes] Electronic Signature(s) Signed: 07/06/2020 4:51:44 PM By: Levan Hurst RN, BSN Entered By: Levan Hurst on 07/05/2020 10:05:46 -------------------------------------------------------------------------------- Multi Wound Chart Details Patient Name: Date of Service: [name], [name] 07/05/2020 9:30 A M Medical Record Number: [id number] Patient Account Number: [id number] Date of Birth/Sex: Treating RN: 1955/10/25 (65 y.o. [name] Primary Care Debara Kamphuis: [name] Other Clinician: Referring Herbert Aguinaldo: Treating Tayvia Faughnan/Extender: [name] in Treatment: 24 Vital Signs Height(in): 68 Pulse(bpm): 71 Weight(lbs): Blood Pressure(mmHg): 123/79 Body Mass Index(BMI): Temperature(F): 98.2  Respiratory Rate(breaths/min): 17 Photos: [15:No Photos Left, Medial Malleolus] [N/A:N/A N/A] Wound Location: [15:Gradually Appeared] [N/A:N/A] Wounding Event: [15:Venous Leg Ulcer] [N/A:N/A] Primary Etiology: [15:Peripheral Venous Disease,] [N/A:N/A] Comorbid History: [15:Osteoarthritis, Confinement Anxiety 12/30/2019] [N/A:N/A] Date Acquired: [15:24] [N/A:N/A] Weeks of Treatment: [15:Open] [N/A:N/A] Wound Status: [15:1.1x0.6x0.1] [N/A:N/A] Measurements L x W x D (cm) [15:0.518] [N/A:N/A] A (cm) : rea [15:0.052] [N/A:N/A] Volume (cm) : [15:95.60%] [N/A:N/A] % Reduction in Area: [15:99.40%] [N/A:N/A] % Reduction in Volume: [15:Full Thickness With Exposed Support N/A] Classification: [15:Structures Medium] [N/A:N/A] Exudate Amount: [15:Serosanguineous] [N/A:N/A] Exudate Type: [15:red, brown] [N/A:N/A] Exudate Color: [15:Flat and Intact] [N/A:N/A] Wound Margin: [15:Large (67-100%)] [N/A:N/A] Granulation Amount: [15:Red] [N/A:N/A] Granulation Quality: [15:None Present (0%)] [N/A:N/A] Necrotic Amount: [15:Fat Layer (Subcutaneous Tissue): Yes N/A] Exposed Structures: [15:Fascia: No Tendon: No Muscle: No Joint: No Bone: No Medium (34-66%)]  [N/A:N/A] Epithelialization: [15:Compression Therapy] [N/A:N/A] Treatment Notes Electronic Signature(s) Signed: 07/05/2020 4:57:15 PM By: Linton Ham MD Signed: 07/05/2020 5:30:16 PM By: Deon Pilling Entered By: Linton Ham on 07/05/2020 10:34:08 -------------------------------------------------------------------------------- Multi-Disciplinary Care Plan Details Patient Name: Date of Service: [facility] [name], [name] 07/05/2020 9:30 A M Medical Record Number: [id number] Patient Account Number: [id number] Date of Birth/Sex: Treating RN: 28-Dec-1955 (65 y.o. [name] Primary Care Daichi Moris: [name] Other Clinician: Referring Shenelle Klas: Treating Dedra Matsuo/Extender: [name] in Treatment: 24 Active Inactive Venous Leg Ulcer Nursing Diagnoses: Actual venous Insuffiency (use after diagnosis is confirmed) Knowledge deficit related to disease process and management Goals: Patient will maintain optimal edema control Date Initiated: 06/20/2020 Target Resolution Date: 08/03/2020 Goal Status: Active Interventions: Assess peripheral edema status every visit. Compression as ordered Treatment Activities: Therapeutic compression applied : 06/20/2020 Notes: Wound/Skin Impairment Nursing Diagnoses: Impaired tissue integrity Knowledge deficit related to ulceration/compromised skin integrity Goals: Patient/caregiver will verbalize understanding of skin care regimen Date Initiated: 01/17/2020 Target Resolution Date: 08/03/2020 Goal Status: Active Ulcer/skin breakdown will have a volume reduction of 30% by week 4 Date Initiated: 01/17/2020 Date Inactivated: 03/12/2020 Target Resolution Date: 03/09/2020 Goal Status: Met Interventions: Assess patient/caregiver ability to obtain necessary supplies Assess patient/caregiver ability to perform ulcer/skin care regimen upon admission and as needed Assess ulceration(s) every visit Provide education on ulcer and skin  care Notes: Electronic Signature(s) Signed: 07/05/2020 5:30:16 PM By: Deon Pilling Entered By: Deon Pilling on 07/05/2020 09:53:29 -------------------------------------------------------------------------------- Pain Assessment Details Patient Name: Date of Service: [facility] [name], [name] 07/05/2020 9:30 A M Medical Record Number: [id number] Patient Account Number: [id number] Date of Birth/Sex: Treating RN: March 21, 1956 (65 y.o. [name] Primary Care Jonia Oakey: [name] Other Clinician: Referring Sanika Brosious: Treating Lenea Bywater/Extender: [name] in Treatment: 24 Active Problems Location of Pain Severity and Description of Pain Patient Has Paino No Site Locations Pain Management and Medication Current Pain Management: Electronic Signature(s) Signed: 07/05/2020 5:30:16 PM By: Deon Pilling Signed: 07/09/2020 9:06:40 AM By: [name] Entered By: [name] on 07/05/2020 09:51:37 -------------------------------------------------------------------------------- Patient/Caregiver Education Details Patient Name: Date of Service: [name], [name] 2/3/2022andnbsp9:30 A M Medical Record Number: [id number] Patient Account Number: [id number] Date of Birth/Gender: Treating RN: 1956-01-28 (65 y.o. [name] Primary Care Physician: [name] Other Clinician: Referring Physician: Treating Physician/Extender: [name] in Treatment: 24 Education Assessment Education Provided To: Patient Education Topics Provided Wound/Skin Impairment: Handouts: Skin Care Do's and Dont's Methods: Explain/Verbal Responses: Reinforcements needed Electronic Signature(s) Signed: 07/05/2020 5:30:16 PM By: Deon Pilling Entered By: Deon Pilling on 07/05/2020 09:53:48 -------------------------------------------------------------------------------- Wound Assessment Details Patient Name: Date  of Service: [name], [name]  07/05/2020 9:30 A M Medical Record Number: [id number] Patient Account Number: [id number] Date of Birth/Sex: Treating RN: Nov 18, 1955 (65 y.o. [name], [name] Primary Care Chantil Bari: [name] Other Clinician: Referring Ved Martos: Treating Delshon Blanchfield/Extender: [name] in Treatment: 24 Wound Status Wound Number: 15 Primary Venous Leg Ulcer Etiology: Wound Location: Left, Medial Malleolus Wound Status: Open Wounding Event: Gradually Appeared Comorbid Peripheral Venous Disease, Osteoarthritis, Confinement Date Acquired: 12/30/2019 History: Anxiety Weeks Of Treatment: 24 Clustered Wound: No Wound Measurements Length: (cm) 1.1 Width: (cm) 0.6 Depth: (cm) 0.1 Area: (cm) 0.518 Volume: (cm) 0.052 % Reduction in Area: 95.6% % Reduction in Volume: 99.4% Epithelialization: Medium (34-66%) Tunneling: No Undermining: No Wound Description Classification: Full Thickness With Exposed Support Structures Wound Margin: Flat and Intact Exudate Amount: Medium Exudate Type: Serosanguineous Exudate Color: red, brown Foul Odor After Cleansing: No Slough/Fibrino No Wound Bed Granulation Amount: Large (67-100%) Exposed Structure Granulation Quality: Red Fascia Exposed: No Necrotic Amount: None Present (0%) Fat Layer (Subcutaneous Tissue) Exposed: Yes Tendon Exposed: No Muscle Exposed: No Joint Exposed: No Bone Exposed: No Treatment Notes Wound #15 (Malleolus) Wound Laterality: Left, Medial Cleanser Soap and Water Discharge Instruction: May shower and wash wound with dial antibacterial soap and water prior to dressing change. Peri-Wound Care Triamcinolone 15 (g) Discharge Instruction: Use triamcinolone 15 (g) as directed Sween Lotion (Moisturizing lotion) Discharge Instruction: Apply moisturizing lotion as directed Topical Primary Dressing PolyMem Silver Non-Adhesive Dressing, 4.25x4.25 in Discharge Instruction: Apply to wound bed as instructed Secondary  Dressing Woven Gauze Sponge, Non-Sterile 4x4 in Discharge Instruction: Apply over primary dressing as directed. ABD Pad, 5x9 Discharge Instruction: Apply over primary dressing as directed. Secured With Compression Wrap Unnaboot w/Calamine, 4x10 (in/yd) Discharge Instruction: Apply Unnaboot as directed. Compression Stockings Add-Ons Electronic Signature(s) Signed: 07/05/2020 5:30:16 PM By: Deon Pilling Signed: 07/06/2020 4:51:44 PM By: Levan Hurst RN, BSN Signed: 07/06/2020 4:51:44 PM By: Levan Hurst RN, BSN Entered By: Levan Hurst on 07/05/2020 10:06:11 -------------------------------------------------------------------------------- Dent Details Patient Name: Date of Service: [name], [name] 07/05/2020 9:30 A M Medical Record Number: [id number] Patient Account Number: [id number] Date of Birth/Sex: Treating RN: 03/14/1956 (65 y.o. [name], Meta.Reding Primary Care Kalandra Masters: [name] Other Clinician: Referring Joclyn Alsobrook: Treating Harmani Neto/Extender: [name] in Treatment: 24 Vital Signs Time Taken: 09:47 Temperature (F): 98.2 Height (in): 68 Pulse (bpm): 71 Respiratory Rate (breaths/min): 17 Blood Pressure (mmHg): 123/79 Reference Range: 80 - 120 mg / dl Electronic Signature(s) Signed: 07/09/2020 9:06:40 AM By: [name] Entered By: [name] on 07/05/2020 09:51:24

## 2020-07-12 ENCOUNTER — Other Ambulatory Visit: Payer: Self-pay

## 2020-07-12 ENCOUNTER — Encounter (HOSPITAL_BASED_OUTPATIENT_CLINIC_OR_DEPARTMENT_OTHER): Payer: Medicare Other | Admitting: Internal Medicine

## 2020-07-12 DIAGNOSIS — I87332 Chronic venous hypertension (idiopathic) with ulcer and inflammation of left lower extremity: Secondary | ICD-10-CM | POA: Diagnosis not present

## 2020-07-12 NOTE — Progress Notes (Signed)
[name], [name] ([id number]) Visit Report for 07/12/2020 HPI Details Patient Name: Date of Service: [name] [name], [name] 07/12/2020 12:30 PM Medical Record Number: [id number] Patient Account Number: [id number] Date of Birth/Sex: Treating RN: 1956-04-23 (65 y.o. [name] Primary Care Provider: Lennie [name] Other Clinician: Referring Provider: Treating Provider/Extender: [name] in Treatment: 25 History of Present Illness HPI Description: the remaining wound is over the left medial ankle. Similar wound over the right medial ankle healed largely with use of Apligraf. Most recently we have been using Hydrofera Blue over this wound with considerable improvement. The patient has been extensively worked up in the past for her venous insufficiency and she is not a candidate for antireflux surgery although I have none of the details available currently. 08/24/14; considerable improvement today. About 50% of this wound areas now epithelialized. The base of the wound appears to be healthier granulation.as opposed to last week when she had deteriorated a considerable improvement 08/17/14; unfortunately the wound has regressed somewhat. The areas of epithelialization from the superior aspect are not nearly as healthy as they were last week. The patient thinks her Hydrofera Blue slipped. 09/07/14; unfortunately the area has markedly regressed in the 2 weeks since I've seen this. There is an [name] surrounding erythema. The healthy granulation tissue that we had at the base of the wound now is a dusky color. The nurse reports green drainage 09/14/14; the area looks somewhat better than last week. There is less erythema and less drainage. The culture I did did not show any growth. Nevertheless I think it is better to continue the Cipro and doxycycline for a further week. The remaining wound area was debridement. 09/21/14. Wound did not require debridement last week. Still less  erythema and less drainage. She can complete her antibiotics. The areas of epithelialization in the superior aspect of the wound do not look as healthy as they did some weeks ago 10/05/14 continued improvement in the condition of this wound. There is advancing epithelialization. Less aggressive debridement required 10/19/14 continued improvement in the condition and volume of this wound. Less aggressive debridement to the inferior part of this to remove surface slough and fibrinous eschar 11/02/14 no debridement is required. The surface granulation appears healthy although some of her islands of epithelialization seem to have regressed. No evidence of infection 11/16/14; lites surface debridement done of surface eschar. The wound does not look to be unhealthy. No evidence of infection. Unfortunately the patient has had podiatry issues in the right foot and for some reason has redeveloped small surface ulcerations in the medial right ankle. Her original presentation involved wounds in this area 11/23/14 no debridement. The area on the right ankle has enlarged. The left ankle wound appears stable in terms of the surface although there is periwound inflammation. There has been regression in the amount of new skin 11/30/14 no debridement. Both wound areas appear healthy. There was no evidence of infection. The the new area on the right medial ankle has enlarged although that both the surfaces appear to be stable. 12/07/14; Debridement of the right medial ankle wound. No no debridement was done on the left. 12/14/14 no major change in and now bilateral medial ankle wounds. Both of these are very painful but the no overt evidence of infection. She has had previous venous ablation 12/21/14; patient states that her right medial ankle wound is considerably more painful last week than usual. Her left is also somewhat painful. She could not tolerate  debridement. The right medial ankle wound has fibrinous surface  eschar 12/28/14 this is a patient with severe bilateral venous insufficiency ulcers. For a considerable period of time we actually had the one on the right medial ankle healed however this recently opened up again in June. The left medial ankle wound has been a refractory area with some absent flows. We had some success with Hydrofera Blue on this area and it literally closed by 50% however it is recently opened up Foley. Both of these were debridement today of surface eschar. She tolerates this poorly 01/25/15: No change in the status of this. Thick adherent escar. Very poor tolerance of any attempt at debridement. I had healed the right medial malleolus wound for a considerable amount of time and had the left one down to about 50% of the volume although this is totally regressed over the last 48 weeks. Further the right leg has reopened. she is trying to make a appointment with pain and vascular, previous ablations with [name]. I do not believe there is an arterial insufficiency issue here 02/01/15 the status of the adherent eschar bilaterally is actually improved. No debridement was done. She did not manage to get vascular studies done 02/08/15 continued debridement of the area was done today. The slough is less adherent and comes off with less pressure. There is no surrounding infection peripheral pulses are intact 02/15/15 selective debridement with a disposable curette. Again the slough is less adherent and comes off with less difficulty. No surrounding infection peripheral pulses are intact. 02/22/15 selective debridement of the right medial ankle wound. Slough comes off with less difficulty. No obvious surrounding infection peripheral pulses are intact I did not debridement the one on the left. Both of these are stable to improved 03/01/15 selective debridement of both wound areas using a curette to. Adherent slough cup soft with less difficulty. No obvious surrounding infection.  The patient tells me that 2 days ago she noted a rash above the right leg wrap. She did not have this on her lower legs when she change this over she arrives with widespread left greater than right almost folliculitis-looking rash which is extremely pruritic. I don't see anything to culture here. There is no rash on the rest of her body. She feels well systemically. 03/08/15; selective debridement of both wounds using a curette. Base of this does not look unhealthy. She had limegreen drainage coming out of the left leg wound and describes a lot of drainage. The rash on her left leg looks improved to. No cultures were done. 03/22/15; patient was not here last week. Basal wounds does not look healthy and there is no surrounding erythema. No drainage. There is still a rash on the left leg that almost looks vasculitic however it is clearly limited to the top of where the wrap would be. 04/05/15; on the right required a surgical debridement of surface eschar and necrotic subcutaneous tissue. I did not debridement the area on the left. These continue to be large open wounds that are not changing that much. We were successful at one point in healing the area on the right, and at the same time the area on the left was roughly half the size of current measurements. I think a lot of the deterioration has to do with the prolonged time the patient is on her feet at work 04/19/15 I attempted-like surface debridement bilaterally she does not tolerate this. She tells me that she was in allergic care yesterday with extreme  pain over her left lateral malleolus/ankle and was told that she has an "sprain" 05/03/15; large bilateral venous insufficiency wounds over the medial malleolus/medial aspect of her ankles. She complains of copious amounts of drainage and his usual large amounts of pain. There is some increasing erythema around the wound on the right extending into the medial aspect of her foot to. historically she  came in with these wounds the right one healed and the left one came down to roughly half its current size however the right one is reopened and the left is expanded. 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 05/10/15 large bilateral venous insufficiency wounds. There is less adherence surface left however the surface culture that I did last week grew pseudomonas therefore bilateral selective debridement score necessary. There is surrounding erythema. The patient describes severe bilateral drainage and a lot of pain in the left ankle. Apparently her podiatrist was were ready to do a cortisone shot 05/17/15; the patient complains of pain and again copious amounts of drainage. 05/24/15; we used Iodo flex last week. Patient notes considerable improvement in wound drainage. Only needed to change this once. 05/31/15; we continued Iodoflex; the base of these large wounds bilaterally is not too bad but there is probably likely a significant bioburden here. I would like to debridement just doesn't tolerate it. 06/06/14 I would like to continue the Iodoflex although she still hasn't managed to obtain supplies. She has bilateral medial malleoli or large wounds which are mostly superficial. Both of them are covered circumferentially with some nonviable fibrinous slough although she tolerates debridement very poorly. She apparently has an appointment for an ablation on the right leg by interventional radiology. 06/14/15; the patient arrives with the wounds and static condition. We attempted a debridement although she does not do well with this secondary to pain. I 07/05/15; wounds are not much smaller however there appears to be a cleaner granulating base. The left has tight fibrinous slough greater than the right. Debridement is tolerated poorly due to pain. Iodoflex is done more for these wounds in any of the multitude of different dressings I have tried on the left  1 and then subsequently the right. 07/12/15; no change in the condition of this wound. I am able to do an aggressive debridement on the right but not the left. She simply cannot tolerate it. We have been using Iodoflex which helps somewhat. It is worthwhile remembering that at one point we healed the right medial ankle wound and the left was about 25% of the current circumference. We have suggested returning to vascular surgery for review of possible further ablations for one reason or another she has not been able to do this. 07/26/15 no major change in the condition of either wound on her medial ankle. I did not attempt to debridement of these. She has been aggressively scrubbing these while she is in the shower at home. She has her supply of Iodoflex which seems to have done more for these wounds then anything I have put on recently. 08/09/15 wound area appears larger although not verified by measurements. Using Iodoflex 09/05/2015 -- she was here for avisit today but had significant problems with the wound and I was asked to see her for a physician opinion. I have summarize that this lady has had surgery on her left lower extremity about 10 years ago where the possible veins stripping was done. She has had  an opinion from interventional radiology around November 2016 where no further sclerotherapy was ordered. The patient works 12 hours a day and stands on a concrete floor with work boots and is unable to get the proper compression she requires and cannot elevate her limbs appropriately at any given time. She has recently grown Pseudomonas from her wound culture but has not started her ciprofloxacin which was called in for her. 09/13/15 this continues to be a difficult situation for this patient. At one point I had this wound down to a 1.5 x 1.5" wound on her left leg. This is deteriorated and the right leg has reopened. She now has substantial wounds on her medial calcaneus, malleoli and into her  lower leg. One on the left has surface eschar but these are far too painful for me to debridement here. She has a vascular surgery appointment next week to see if anything can be done to help here. I think she has had previous ablations several years ago at [facility]. She has no major edema. She tells me that she did not get product last time [name] [employer] and went for several days without it. She continues to work in work boots 12 hours a day. She cannot get compression/4-layer under her work boots. 09/20/15 no major change. Periwound edema control was not very good. Her point with pain and vascular is next Wednesday the 25th 09/28/15; the patient is seen vascular surgery and is apparently scheduled for repeat duplex ultrasounds of her bilateral lower legs next week. 10/05/15; the patient was seen by Dr. Doren Custard of vascular surgery. He feels that she should have arterial insufficiency excluded as cause/contributed to her nonhealing stage she is therefore booked for an arteriogram. She has apparently monophasic signals in the dorsalis pedis pulses. She also of course has known severe chronic venous insufficiency with previous procedures as noted previously. I had another long discussion with the patient today about her continuing to work 12 hour shifts. I've written her out for 2 months area had concerns about this as her work location is currently undergoing significant turmoil and this may lead to her termination. She is aware of this however I agree with her that she simply cannot continue to stand for 12 hours multiple days a week with the substantial wound areas she has. 10/19/15; the Dr. Doren Custard appointment was largely for an arteriogram which was normal. She does not have an arterial issue. He didn't make a comment about her chronic venous insufficiency for which she has had previous ablations. Presumably it was not felt that anything additional could be done. The patient is now out of work as I  prescribed 2 weeks ago. Her wounds look somewhat less aggravated presumably because of this. I felt I would give debridement another try today 10/25/15; no major change in this patient's wounds. We are struggling to get her product that she can afford into her own home through her insurance. 11/01/15; no major change in the patient's wounds. I have been using silver alginate as the most affordable product. I spoke to Dr. Marla Roe last week with her requested take her to the OR for surgical debridement and placement of ACEL. Dr. Marla Roe told me that she would be willing to do this however [employer] [name] will not cover this, fortunately the patient has [place] healthcare of some variant 11/08/15; no major change in the patient's wounds. She has been completely nonviable surface that this but is in too much pain with any attempted  debridement are clinic. I have arranged for her to see Dr. Marla Roe ham of plastic surgery and this appointment is on Monday. I am hopeful that they will take her to the OR for debridement, possible ACEL ultimately possible skin graft 11/22/15 no major change in the patient's wounds over her bilateral medial calcaneus medial malleolus into the lower legs. Surface on these does not look too bad however on the left there is surrounding erythema and tenderness. This may be cellulitis or could him sleepy tinea. 11/29/15; no major changes in the patient's wounds over her bilateral medial malleolus. There is no infection here and I don't think any additional antibiotics are necessary. There is now plan to move forward. She sees Dr. Marla Roe in a week's time for preparation for operative debridement and ACEL placement I believe on 7/12. She then has a follow-up appointment with Dr. Marla Roe on 7/21 12/28/15; the patient returns today having been taken to the [facility] by Dr. Marla Roe 12/12/15 she underwent debridement, intraoperative cultures [which were negative]. She had  placement of a wound VAC. Parent really ACEL was not available to be placed. The wound VAC foam apparently adhered to the wound since then she's been using silver alginate, Xeroform under Ace wraps. She still says there is a lot of drainage and a lot of pain 01/31/16; this is a patient I see monthly. I had referred her to Dr. Marla Roe him of plastic surgery for large wounds on her bilateral medial ankles. She has been to the OR twice once in early July and once in early August. She tells me over the last 3 weeks she has been using the wound VAC with ACEL underneath it. On the right we've simply been using silver alginate. Under Kerlix Coban wraps. 02/28/16; this is a patient I'm currently seeing monthly. She is gone on to have a skin graft over her large venous insufficiency ulcer on the left medial ankle. This was done by Dr. Marla Roe him. The patient is a bit perturbed about why she didn't have one on her right medial ankle wound. She has been using silver alginate to this. 03/06/16; I received a phone call from her plastic surgery Dr. Marla Roe. She expressed some concern about the viability of the skin graft she did on the left medial ankle wound. Asked me to place Endoform on this. She told me she is not planning to do a subsequent skin graft on the right as the left one did not take very well. I had placed Hydrofera Blue on the right 03/13/16; continue to have a reasonably healthy wound on the right medial ankle. Down to 3 mm in terms of size. There is epithelialization here. The area on the left medial ankle is her skin graft site. I suppose the last week this looks somewhat better. She has an open area inferiorly however in the center there appears to be some viable tissue. There is a lot of surface callus and eschar that will eventually need to come off however none of this looked to be infected. Patient states that the is able to keep the dressing on for several days which is an  improvement. 03/20/16 no major change in the circumference of either wound however on the left side the patient was at Dr. Eusebio Friendly office and they did a debridement of left wound. 50% of the wound seems to be epithelialized. I been using Endoform on the left Hydrofera Blue in the right 03/27/16; she arrives today with her wound is not looking  as healthy as they did last week. The area on the right clearly has an adherent surface to this a very similar surface on the left. Unfortunately for this patient this is all too familiar problem. Clearly the Endoform is not working and will need to change that today that has some potential to help this surface. She does not tolerate debridement in this clinic very well. She is changing the dressing wants 04/03/16; patient arrives with the wounds looking somewhat better especially on the right. Dr. Migdalia Dk change the dressing to silver alginate when she saw her on Monday and also sold her some compression socks. The usefulness of the latter is really not clear and woman with severely draining wounds. 04/10/16; the patient is doing a bit of an experiment wearing the compression stockings that Dr. Migdalia Dk provided her to her left leg and the out of legs based dressings that we provided to the right. 05/01/16; the patient is continuing to wear compression stockings Dr. Migdalia Dk provided her on the left that are apparently silver impregnated. She has been using Iodoflex to the right leg wound. Still a moderate amount of drainage, when she leaves here the wraps only last for 4 days. She has to change the stocking on the left leg every night 05/15/16; she is now using compression stockings bilaterally provided by Dr. Marla Roe. She is wearing a nonadherent layer over the wounds so really I don't think there is anything specific being done to this now. She has some reduction on the left wound. The right is stable. I think all healing here is being done without a  specific dressing 06/09/16; patient arrives here today with not much change in the wound certainly in diameter to large circular wounds over the medial aspect of her ankle bilaterally. Under the light of these services are certainly not viable for healing. There is no evidence of surrounding infection. She is wearing compression stockings with some sort of silver impregnation as prescribed by Dr. Marla Roe. She has a follow-up with her tomorrow. 06/30/16; no major change in the size or condition of her wounds. These are still probably covered with a nonviable surface. She is using only her purchase stockings. She did see Dr. Marla Roe who seemed to want to apply Dakin's solution to this I'm not extreme short what value this would be. I would suggest Iodoflex which she still has at home. 07/28/16; I follow [name] episodically along with Dr. Marla Roe. 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 She has been applying a topical collagen ointment to both wounds with Adaptic. I don't think Dr. Marla Roe is planning to take her back to the OR. 08/19/16; I follow [name] on a monthly basis along with Dr. Marla Roe of plastic surgery. 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 I been following her for a number of years. At one point I was able to get the right medial malleolus wound to heal and had the left medial malleolus down to about half its current size however and I had to send her to plastic surgery for an operative debridement. Since then things have been stable to slightly improve the area on the right is slightly better one in the left about the same although there is much less adherent surface than I'm used to with this patient. She is using some form of liquid collagen gel that Dr. Marla Roe provided a Kerlix cover  with the patient's own pressure stockings.  She tells me that she has extreme pain in both ankles and along the lateral aspect of both feet. She has been unable to work for some period of time. She is telling me she is retiring at the beginning of April. She sees Dr. Doran Durand of orthopedics next week 09/22/16; patient has not seen Dr. Marla Roe since the last time she is here. I'm not really sure what she is using to the wounds other than bits and pieces of think she had left over including most recently Hydrofera Blue. She is using juxtalite stockings. She is having difficulty with her husband's recent illness "stroke". She is having to transport him to various doctors appointments. Dr. Marla Roe left her the option of a repeat debridement with ACEL however she has not been able to get the time to follow-up on this. She continues to have a fair amount of drainage out of these wounds with certainly precludes leaving dressings on all week 10/13/16; patient has not seen Dr. Marla Roe since she was last in our clinic. I'm not really sure what she is doing with the wounds, we did try to get her [name] and I think she is actually using this most of the time. Because of drainage she states she has to change this every second day although this is an improvement from what she used to do. She went to see Dr. Doran Durand who did not think she had a muscular issue with regards to her feet, he referred her to a neurologist and I think the appointment is sometime in June. I changed her back to Iodoflex which she has used in the past but not recently. 11/03/16; the patient has been using Iodoflex although she ran out of this. Still claims that there is a lot of drainage although the wound does not look like this. No surrounding erythema. She has not been back to see Dr. Marla Roe 11/24/16; the patient has been using Iodoflex again but she ran out of it 2 or 3 days ago. There is no major change in the condition of either one of these wounds in fact they are  larger and covered in a thick adherent surface slough/nonviable tissue especially on the left. She does not tolerate mechanical debridement in our clinic. Going back to see Dr. Marla Roe of plastic surgery for an operative debridement would seem reasonable. 12/15/16; the patient has not been back to see Dr. Marla Roe. She is been dealing with a series of illnesses and her husband which of monopolized her time. She is been using Sorbact which we largely supplied. She states the drainage is bad enough that it maximum she can go 2-3 days without changing the dressing 01/12/2017 -- the patient has not been back for about 4 weeks and has not seen Dr. Marla Roe not does she have any appointment pending. 01/23/17; patient has not seen Dr. Marla Roe even though I suggested this previously. She is using Santyl that was suggested last week by Dr. Con Memos this Cost her $16 through her insurance which is indeed surprising 02/12/17; continuing Santyl and the patient is changing this daily. A lot of drainage. She has not been back to see plastic surgery she is using an Ace wrap. Our intake nurse suggested wrap around stockings which would make a good reasonable alternative 02/26/17; patient is been using Santyl and changing this daily due to drainage. She has not been to see plastic surgery she uses in April Ace wrap to control the edema.  She did obtain extremitease stockings but stated that the edema in her leg was to big for these 03/20/17; patient is using Santyl and Anasept. Surfaces looked better today the area on the right is actually measuring a little smaller. She has states she has a lot of pain in her feet and ankles and is asking for a consult to pain control which I'll try to help her with through our case manager. 04/10/17; the patient arrives with better-looking wound surfaces and is slightly smaller wound on the left she is using a combination of Santyl and Anasept. She has an appointment or at least as  started in the pain control center associated with [facility] 05/14/17; this is a patient who I followed for a prolonged period of time. 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 At one point I had this down to a much smaller wound on the left however these reopened and we've never been able to get these to heal. She has been using Santyl and Anasept gel although 2 weeks ago she ran out of the Anasept gel. She has a stable appearance of the wound. She is going to the wound care clinic at [facility]. They wanted do a nerve block/spinal block although she tells me she is reluctant to go forward with that. 05/21/17; this is a patient I have followed for many years. 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 Chronic pain and deformity in her ankles as well. She is been to see plastic surgery as well as orthopedics. Using PolyMem AG most recently/Kerramax/ABDs and 2 layer compression. She has managed to keep this on and she is coming in for a nurse check to change the dressing on Tuesdays, we see her on Fridays 06/05/17; really quite a good looking surface and the area especially on the right medial has contracted in terms of dimensions. Well granulated healthy-looking tissue on both sides. Even with an open curet there is nothing that even feels abnormal here. This is as good as I've seen this in quite some time. We have been using PolyMem AG and bringing her in for a nurse check 06/12/17; really quite good surface on both of these wounds. The right medial has contracted a bit left is not. We've been using PolyMem and AG and she is coming in for a nurse visit 06/19/17; we have been using PolyMem AG and bringing her in for a nurse check. Dimensions of her wounds are not better but the surfaces looked better bilaterally. She complained of bleeding last night and the left wound and increasing pain bilaterally. She states her wound pain is more  neuropathic than just the wounds. There was some suggestion that this was radicular from her pain management doctor in talking to her it is really difficult to sort this out. 06/26/17; using PolyMem and AG and bringing her in for a nurse check as All of this and reasonably stable condition. Certainly not improved. The dimensions on the lateral part of the right leg look better but not really measuring better. The medial aspect on the left is about the same. 07/03/16; we have been using PolyMen AG and bringing her in for a nurse check to change the dressings as the wounds have drainage which precludes once weekly changing. We are using all secondary absorptive dressings.our intake nurse is brought up the idea of using a wound VAC/snap VAC on the wound to help with the drainage to see if this would result  in some contraction. This is not a bad idea. The area on the right medial is actually looking smaller. Both wounds have a reasonable-looking surface. There is no evidence of cellulitis. The edema is well controlled 07/10/17; the patient was denied for a snap VAC by her insurance. The major issue with these wounds continues to be drainage. We are using wicked PolyMem AG and she is coming in for a nurse visit to change this. The wounds are stable to slightly improved. The surface looks vibrant and the area on the right certainly has shrunk in size but very slowly 07/17/17; the patient still has large wounds on her bilateral medial malleoli. Surface of both of these wounds looks better. The dimensions seem to come and go but no consistent improvement. There is no epithelialization. We do not have options for advanced treatment products due to insurance issues. They did not approve of the wound VAC to help control the drainage. More recently we've been using PolyMem and AG wicked to allow drainage through. We have been bringing her in for a nurse visit to change this. We do not have a lot of options for wound  care products and the home again due to insurance issues 07/24/17; the patient's wound actually looks somewhat better today. No drainage measurements are smaller still healthy-looking surface. We used silver collagen under PolyMen started last week. We have been bringing her in for a dressing change 07/31/17; patient's wound surface continued to look better and I think there is visible change in the dimensions of the wound on the right. Rims of epithelialization. We have been using silver collagen under PolyMen and bringing her in for a dressing change. There appears to be less drainage although she is still in need of the dressing change 08/07/17. Patient's wound surface continues to look better on both sides and the area on the right is definitely smaller. We have been using silver collagen and PolyMen. She feels that the drainage has been it has been better. I asked her about her vascular status. She went to see [name] at [facility] and had some form of ablation. I don't have much detail on this. I haven't my notes from 2016 that she was not a candidate for any further ablation but I don't have any more information on this. We had referred her to vein and vascular I don't think she ever went. He does not have a history of PAD although I don't have any information on this either. We don't even have ABIs in our record 08/14/17; we've been using silver collagen and PolyMen cover. And putting the patient and compression. She we are bringing her in as a nurse visit to change this because ofarge amount of drainage. We didn't the ABIs in clinic today since they had been done in many moons 1.2 bilaterally. She has been to see vein and vascular however this was at [facility] and she had ablation although I really don't have any information on this all seemed biking get a report. She is also been operatively debrided by plastic surgery and had a cell placed probably 8-12 months ago. This didn't have  a major effect. We've been making some gains with current dressings 08/19/17-She is here in follow-up evaluation for bilateral medial malleoli ulcers. She continues to tolerate debridement very poorly. We will continue with recently changed topical treatment; if no significant improvement may consider switching to Iodosorb/Iodoflex. 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  She has been using silver collagen and PolyMem. I believe she has been used and tried on Iodoflex before. 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 These have been very refractory over time. We have been using silver collagen and PolyMen. She is coming in once a week for a doctor's and once a week for nurse visits. We are actually making some progress 09/18/17; the patient's wounds are smaller especially on the right medial. She arrives today to upset to consider even washing these off with Anasept which I think is been part of the reason this is been closing. We've been using collagen covered in PolyMen otherwise. It is noted that she has a small area of folliculitis on the right medial calf that. As we are wrapping her legs I'll give her a short course of doxycycline to make sure this doesn't amount to anything. She is a long list of complaints today including imbalance, shortness of breath on exertion, inversion of her left ankle. With regards to the latter complaints she is been to see orthopedics and they offered her a tendon release surgery I believe but wanted her wounds to be closed first. I have recommended she go see her primary physician with regards to everything else. 09/25/17; patient's wounds are about the same size. We have made some progress bilaterally although not in recent  weeks. She will not allow me T wash these wounds with Anasept even if she is doing her cell. Wheeze we've been using collagen covered in PolyMen. Last week she had a small area of folliculitis this is now opened into a small wound. She completed 5 days of trimethoprim sulfamethoxazole 10/02/17; unfortunately the area on her left medial ankle is worse with a larger wound area towards the Achilles. The patient complains of a lot of pain. She will not allow debridement although visually I don't think there is anything to debridement in any case. We have been using silver collagen and PolyMen for several months now. Initially we are making some progress although I'm not really seeing that today. We will move back to [facility]. His admittedly this is a bit of a repeat however I'm hoping that his situation is different now. The patient tells me she had her leg on the left give out on her yesterday this is process some pain. 10/09/17; the patient is seen twice a week largely because of drainage issues coming out of the chronic medial bimalleolar wounds that are chronic. Last week the dimensions of the one on the left looks a little larger I changed her to [facility]. She comes in today with a history of terrible pain in the bilateral wound areas. She will not allow debridement. She will not even allow a tissue culture. There is no surrounding erythema no no evidence of cellulitis. We have been putting her Kerlix Coban man. She will not allow more aggressive compression as there was a suggestion to put her in 3 layer wraps. 10/16/17; large wounds on her bilateral medial malleoli. These are chronic. Not much change from last week. The surface looks have healthy but absolutely no epithelialization. A lot of pain little less so of drainage. She will not allow debridement or even washing these off in the vigorous fashion with Anasept. 10/23/17; large wounds on her bilateral malleoli which are chronic.  Some  improvement in terms of size perhaps on the right since last time I saw these. She states that after we increased the 3 layer compression there was some bleeding, when she came in for a nurse visit she did not want 3 layer compression put back on about our nurse managed to convince her. She has known chronic venous visit issues and I'm hoping to get her to tolerate the 3 layer compression. using Hydrofera Blue 10/30/17; absolutely no change in the condition of either wound although we've had some improvement in dimensions on the right.. Attempted to put her in 3 layer compression she didn't tolerated she is back in 2 layer compression. We've been using Hydrofera Blue We looked over her past records. She had venous reflux studies in November 2016. There was no evidence of deep venous reflux on the right. Superficial vein did not show the greater saphenous vein at think this is been previously ablated the small saphenous vein was within normal limits. The left deep venous system showed no DVT the vessels were positive for deep venous reflux in the posterior tibial veins at the ankle. The greater saphenous vein was surgically absent small saphenous vein was within normal limits. She went to vein and vascular at [facility]. I believe she had an ablation on the left greater saphenous vein. I'll update her reflux studies perhaps ever reviewed by vein and vascular. We've made absolutely no progress in these wounds. Will also try to read and TheraSkins through her insurance 11/06/17; W the patient apparently has a 2 week follow-up with vein and vascular I like him to review the whole issue with regards to her previous vascular workup by [name]. We've really made no progress on these wounds in many months. She arrives today with less viable looking surface on the left medial ankle wound. This was apparently looking about the same on Tuesday when she was here for nurse visit. 11/13/17; deep tissue  culture I did last time of the left lower leg showed multiple organisms without any predominating. In particular no Staphylococcus or group A strep were isolated. We sent her for venous reflux studies. She's had a previous left greater saphenous vein stripping and I think sclerotherapy of the right greater saphenous vein. She didn't really look at the lesser saphenous vein this both wounds are on the medial aspect. She has reflux in the common femoral vein and popliteal vein and an accessory vein on the right and the common femoral vein and popliteal vein on the left. I'm going to have her go to see vein and vascular just the look over things and see if anything else beside aggressive compression is indicated here. We have not been able to make any progress on these wounds in spite of the fact that the surface of the wounds is never look too bad. 11/20/17; no major change in the condition of the wounds. Patient reports a large amount of drainage. She has a lot of complaints of pain although enlisting her today I wonder if some of this at least his neuropathic rather than secondary to her wounds. She has an appointment with vein and vascular on 12/30/17. The refractory nature of these wounds in my mind at least need vein and vascular to look over the wounds the recent reflux studies we did and her history to see if anything further can be done here. I also note her gait is deteriorated quite a bit. Looks like she has inversion of her foot  on the right. She has a bilateral Trendelenburg gait. I wonder if this is neuropathic or perhaps multilevel radicular. 11/27/17; her wounds actually looks slightly better. Healthy-looking granulation tissue a scant amount of epithelialization. [place] healthcare will not pay for [employer]. They will play for tri layer Oasis and Dermagraft. This is not a diabetic ulcer. We'll try for the tri layer Oasis. She still complains of some drainage. She has a vein and vascular  appointment on 12/30/17 12/04/17; the wounds visually look quite good. Healthy-looking granulation with some degree of epithelialization. We are still waiting for response to our request for trial to try layer Oasis. Her appointment with vascular to review venous and arterial issues isn't sold the end of July 7/31. Not allow debridement or even vigorous cleansing of the wound surface. 12/18/17; slightly smaller especially on the right. 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 We've been using Hydrofera Blue. We still are looking into triple layer Oasis through her insurance 01/08/18 on evaluation today patient's wound actually appears to be showing signs of good improvement at this point in time. She has been tolerating the dressing changes without complication. Fortunately there does not appear to be any evidence of infection at this point in time. We have been utilizing silver nitrate which does seem to be of benefit for her which is also good news. Overall I'm very happy with how things seem to be both regards appearance as well as measurement. Patient did see Dr. Bridgett Larsson for evaluation on 12/30/17. In his assessment he felt that stripping would not likely add much more than chronic compression to the patient's healing process. His recommendation was to follow-up in three months with Dr. Doren Custard if she hasn't healed in order to consider referral back to you and see vascular where she previously was in a trial and was able to get her wound to heal. I'll be see what she feels she when you staying compression and he reiterated this as well. 01/13/18 on evaluation today patient appears to actually be doing very well in regard to her bilateral medial malleolus ulcers. She seems to have tolerated the chemical cauterization with silver nitrate last week she did have some pain through that evening but fortunately states that I'll be see since it seems to be doing better she is overall  pleased with the progress. 01/21/18; really quite a remarkable improvement since I've last seen these wounds. We started using silver nitrate specially on the islands of hyper granulation which for some reason her around the wound circumference. This is really done quite nicely. Primary dressing Hydrofera Blue under 4 layer compression. She seems to be able to hold out without a nurse rewrap. Follow-up in 1 week 01/28/18; we've continued the hydrofera blue but continued with chemical cauterization to the wound area that we started about a month ago for irregular hyper granulation. She is made almost stunning improvement in the overall wound dimensions. I was not really expecting this degree of improvement in these chronic wounds 02/05/18; we continue with Hydrofera Bluebut of also continued the aggressive chemical cauterization with silver nitrate. We made nice progress with the right greater than left wound. 02/12/18. We continued with Hydrofera Blue after aggressive chemical cauterization with silver nitrate. We appear to be making nice progress with both wound areas 02/19/2018; we continue with [facility] after washing the wounds vigorously with Anasept spray and chemical cauterization with silver nitrate. We are making excellent progress. The area on the right's just about closed 02/26/2018. The  area on the left medial ankle had too much necrotic debris today. I used a #5 curette we are able to get most of the soft. I continued with the silver nitrate to the much smaller wound on the right medial ankle she had a new area on her right lower pretibial area which she says was due to a role in her compression 03/05/2018; both wound areas look healthy. Not much change in dimensions from last week. I continue to use silver nitrate and Hydrofera Blue. The patient saw Dr. Doren Custard of vein and vascular. He felt she had venous stasis ulcers. He felt based on her previous arteriogram she should have  adequate circulation for healing. Also she has deep venous reflux but really no significant correctable superficial venous reflux at this time. He felt we should continue with conservative management including leg elevation and compression 04/02/2018; since we last saw this woman about a month ago she had a fall apparently suffered a pelvic fracture. I did not look up the x-ray. Nevertheless because of pain she literally was bedbound for 2 weeks and had home health coming out to change the dressing. Somewhat predictably this is resulted in considerable improvement in both wound areas. The right is just about closed on the medial malleolus and the left is about half the size. 04/16/2018; both her wounds continue to go down in size. Using Hydrofera Blue. 05/07/18; both her wounds appeared to be improving especially on the right where it is almost closed. We are using Hydrofera Blue 05/14/2018; slightly worse this week with larger wounds. Surface on the left medial not quite as good. We have been using Hydrofera Blue 05/21/18; again the wounds are slightly larger. Left medial malleolus slightly larger with eschar around the circumference. We have been using Hydrofera Blue undergoing a wraps for a prolonged period of time. This got a lot better when she was more recumbent due to a fall and a back injury. I change the primary dressing the silver alginate today. She did not tolerate a 4 layer compression previously although I may need to bring this up with her next time 05/28/2018; area on the left medial malleolus again is slightly larger with more drainage. Area on the right is roughly unchanged. She has a small area of folliculitis on the right medial just on the lower calf. This does not look ominous. 06/03/2018 left medial malleolus slightly smaller in a better looking surface. We used silver nitrate on this last time with silver alginate. The area on the right appears slightly smaller 1/10; left medial  malleolus slightly smaller. Small open area on the right. We used silver nitrate and silver alginate as of 2 weeks ago. We continue with the wound and compression. These got a lot better when she was off her feet 1/17; right medial malleolus wound is smaller. The left may be slightly smaller. Both surfaces look somewhat better. 1/24; both wounds are slightly smaller. Using silver alginate under Unna boots 1/31; both wounds appear smaller in fact the area on the right medial is just about closed. Surface eschar. We have been using silver alginate under Unna boots. The patient is less active now spends let much less time on her feet and I think this is contributed to the general improvement in the wound condition 2/7; both wounds appear smaller. I was hopeful the right medial would be closed however there there is still the same small open area. Slight amount of surface eschar on the left the dimensions are  smaller there is eschar but the wound edges appear to be free. We have been using silver alginate under Unna boot's 2/14; both wounds once again measure smaller. Circumferential eschar on the left medial. We have been using silver alginate under Unna boots with gradual improvement 2/21; the area on the right medial malleolus has healed. The area on the left is smaller. We have been using silver alginate and Unna boots. We can discharge wrapping the right leg she has 20/30 stockings at home she will need to protect the scar tissue in this area 2/28; the area on the right medial malleolus remains closed the patient has a compression stocking. The area on the left is smaller. We have been using silver alginate and Unna boots. 3/6 the area on the right medial ankle remains closed. Good edema control noted she is using her own compression stocking. The area on the left medial ankle is smaller. We have been managing this with silver alginate and Unna boots which we will continue today. 3/13; the area on  the right medial ankle remains closed and I'm declaring it healed today. When necessary the left is about the same still a healthy-looking surface but no major change and wound area. No evidence of infection and using silver alginate under unna and generally making considerable improvement 3/27 the area on the right medial ankle remains closed the area on the left is about the same as last week. Certainly not any worse we have been using silver alginate under an Unna boot 4/3; the area on the right medial ankle remains closed per the patient. We did not look at this wound. The wound on the left medial ankle is about the same surface looks healthy we have been using silver alginate under an Unna boot 4/10; area on the right medial ankle remains closed per the patient. We did not look at this wound. The wound on the left medial ankle is slightly larger. The patient complains that the [name] caused burning pain all week. She also told us that she was a lot more active this week. Changed her back to silver alginate 4/17; right medial ankle still closed per the patient. Left medial ankle is slightly larger. Using silver alginate. She did not tolerate Hydrofera Blue on this area 4/24; right medial ankle remains closed we have not look at this. The left medial ankle continues to get larger today by about a centimeter. We have been using silver alginate under Unna boots. She complains about 4 layer compression as an alternative. She has been up on her feet working on her garden 5/8; right medial ankle remains closed we did not look at this. The left medial ankle has increased in size about 100%. We have been using silver alginate under Unna boots. She noted increased pain this week and was not surprised that the wound is deteriorated 5/15; no major change in SA however much less erythema ( one week of doxy ocellulitis). 5/22-65 year old female returns at 1 week to the clinic for left medial ankle  wound for which we have been using silver alginate under 3 layer compression She was placed on DOXY at last visit - the wound is wider at this visit. She is in 3 layer compression 5/29; change to [name] last week. I had given her empiric doxycycline 2 weeks ago for a week. She is in 3 layer compression. She complains of a lot of pain and drainage on presentation today. 6/5; using Hydrofera Blue. I gave  her doxycycline recently empirically for erythema and pain around the wound. Believe her cultures showed enterococcus which not would not have been well covered by doxycycline nevertheless the wound looks better and I don't feel specifically that the enterococcus needs to be covered. She has a new what looks like a wrap injury on her lateral left ankle. 6/12; she is using Hydrofera Blue. She has a new area on the left anterior lower tibial area. This was a wrap injury last week. 6/19; the patient is using Hydrofera Blue. She arrived with marked inflammation and erythema around the wound and tenderness. 12/01/18 on evaluation today patient appears to be doing a little bit better based on what I'm hearing from the standpoint of lassos evaluation to this as far as the overall appearance of the wound is concerned. Then sometime substandard she typically sees Dr. Dellia Nims. Nonetheless overall very pleased with the progress that she's made up to this point. No fevers, chills, nausea, or vomiting noted at this time. 7/10; some improvement in the surface area. Aggressively debrided last week apparently. I went ahead with the debridement today although the patient does not tolerate this very well. We have been using Iodoflex. Still a fair amount of drainage 7/17; slightly smaller. Using Iodoflex. 7/24; no change from last week in terms of surface area. We have been using Iodoflex. Surface looks and continues to look somewhat better 7/31; surface area slightly smaller better looking surface. We have been  using Iodoflex. This is under Unna boot compression 8/7-Patient presents at 1 week with Unna boot and Iodoflex, wound appears better 8/14-Patient presents at 1 week with Iodoflex, we use the Unna boot, wound appears to be stable better.Patient is getting Botox treatment for the inversion of the foot for tendon release, Next week 8/21; we are using Iodoflex. Unna boot. The wound is stable in terms of surface area. Under illumination there is some areas of the wound that appear to be either epithelialized or perhaps this is adherent slough at this point I was not really clear. It did not wipe off and I was reluctant to debride this today. 8/28; we are using Iodoflex in an Unna boot. Seems to be making good improvement. 9/4; using Iodoflex and wound is slightly smaller. 9/18; we are using Iodoflex with topical silver nitrate when she is here. The wound continues to be smaller 10/2; patient missed her appointment last week due to GI issues. She left and Iodoflex based dressing on for 2 weeks. Wound is about the same size about the size of a dime on the left medial lower 10/9 we have been using Iodoflex on the medial left ankle wound. She has a new superficial probable wrap injury on the dorsal left ankle 10/16; we have been using Hydrofera Blue since last week. This is on the left medial ankle 10/23; we have been using Hydrofera Blue since 2 weeks ago. This is on the left medial ankle. Dimensions are better 11/6; using Hydrofera Blue. I think the wound is smaller but still not closed. Left medial ankle 11/13; we have been using Hydrofera Blue. Wound is certainly no smaller this week. Also the surface not as good. This is the remanent of a very large area on her left medial ankle. 11/20; using Sorbact since last week. Wound was about the same in terms of size although I was disappointed about the surface debris 12/11; 3-week follow-up. Patient was on vacation. Wound is measuring slightly larger we have  been using Sorbact. 12/18; wound  is about the same size however surface looks better last week after debridement. We have been using Sorbact under compression 1/15 wound is probably twice the size of last time increased in length nonviable surface. We have been using Sorbact. She was running a mild fever and missed her appointment last week 1/22; the wound is come down in size but under illumination still a very adherent debris we have been Hydrofera Blue that I changed her to last week 1/29; dimensions down slightly. We have been using Hydrofera Blue 2/19 dimensions are the same however there is rims of epithelialization under illumination. Therefore more the surface area may be epithelialized 2/26; the patient's wound actually measures smaller. The wound looks healthy. We have been using Hydrofera Blue. I had some thoughts about running Apligraf then I still may do that however this looks so much better this week we will delay that for now 3/5; the wound is small but about the same as last week. We have been using Hydrofera Blue. No debridement is required today. 3/19; the wound is about the size of a dime. Healthy looking wound even under illumination. We have been using Hydrofera Blue. No mechanical debridement is necessary 3/26; not much change from last week although still looks very healthy. We have been using Hydrofera Blue under Unna boots Patient was offered an ankle fusion by podiatry but not until the wound heals with a proceed with this. 4/9; the patient comes in today with her original wound on the medial ankle looking satisfactory however she has some uncontrolled swelling in the middle part of her leg with 2 new open areas superiorly just lateral to the tibia. I think this was probably a wrap issue. She said she felt uncomfortable during the week but did not call in. We have been using Hydrofera Blue 4/16; the wound on the medial ankle is about the same. She has innumerable small  areas superior to this across her mid tibia. I think this is probably folliculitis. She is also been working in the yard doing a lot of sweating 4/30; the patient issue on the upper areas across her mid tibia of all healed. I think this was excessive yard work if I remember. Her wound on the medial ankle is smaller. Some debris on this we have been using Hydrofera Blue under Unna boots 5/7; mid tibia. She has been using Hydrofera Blue under an Unna wrap. She is apparently going for her ankle surgery on June 3 10/28/19-Patient returns to clinic with the ankle wound, we are using Hydrofera Blue under Unna wrap, surgery is scheduled for her left foot for June 3 so she will be back for nurse visit next week READMISSION 01/17/2020 [name] is a 65 year old woman we have had in this clinic for a long period of time with severe venous hypertension and refractory wounds on her medial lower legs and ankles bilaterally. 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 When she was off her legs for a prolonged period example when she fell and suffered a compression fracture things would heal up quite nicely. She is now retired and we managed to heal up the right medial leg wound. The left one was very tiny last time I saw this although still refractory. 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 It got to the point where this was interfering with her walking and she elected to proceed with a ankle arthrodesis to straighten her her ankle and leave her with a functional outcome for mobilization. The patient was referred to Dr. Doren Custard and really this took some time to arrange. Dr. Doren Custard saw her on 12/07/2019. Once again he verified that she had no arterial issues. She had previously had an angiogram several  years ago. Follow-up ABIs on the left showed an ABI of 1.12 with triphasic waveforms and a TBI of 0.92. She is felt to have chronic deep venous insufficiency but I do not think it was felt that anything could be done from about this from an ablation point of view. At the time Dr. Doren Custard saw this patient the wounds actually look closed via the pictures in his clinic. The patient finally underwent her surgery on 12/15/2019. This went reasonably well and there was a good anatomic outcome. She developed a small distal wound dehiscence on the lateral part of the surgical wound. However more problematically she is developed recurrence of the wound on the medial left ankle. There are actually 2 wounds here one in the distal lower leg and 1 pretty much at the level of the medial malleolus. It is a more distal area that is more problematic. She has been using Hydrofera Blue which started on Friday before this she was simply Ace wrapping. There was a culture done that showed Pseudomonas and she is on ciprofloxacin. A recent CNS on 8/11 was negative. The patient reports some pain but I generally think this is improving. She is using a cam boot completely nonweightbearing using a walker for pivot transfers and a wheelchair 8/24; not much improvement unfortunately she has a surgical wound on the lateral part in the venous insufficiency wound medially. The bottom part of the medial insufficiency wound is still necrotic there is exposed tendon here. We have been using Hydrofera Blue under compression. Her edema control is however better 8/31; patient in for follow-up of his surgical wound on the lateral part of her left leg and chronic venous insufficiency ulcers medially. We put her back in compression last week. She comes in today with a complaint of 3 or 4 days worth of increasing pain. She felt her cam walker was rubbing on the area on the back of her heel. However there is intense erythema seems more likely she  has cellulitis. She had 2 cultures done when she was seeing podiatry in the postop. One of them in late July showed Pseudomonas and she received a course of ciprofloxacin the other was negative on 8/11 she is allergic to penicillin with anaphylactoid complaints of hives oral swelling via information in epic 9/9; when I saw this patient last week she had intense anterior erythema around her wound on the right lateral heel and ankle and also into the right medial heel. Some of this was no doubt drainage and her walker boot however I was convinced she had cellulitis. I gave her Levaquin and Bactrim she is finishing up on this now. She is following up with Dr. [name] he saw her yesterday. He is taken her out of the walking boot of course she is still nonweightbearing. Her x-ray was negative for any worrisome features such as soft tissue air etc. Things are a lot better this week. She has home health. We have been using Hydrofera Blue under an [employer] which she put back on yesterday. I did not  wrap her last week 9/17; her surrounding skin looks a lot better. In fact the area on the left lateral ankle has just a scant amount of eschar. The only remaining wound is the large area on the left medial ankle. Probably about 60% of this is healthy granulation at the surface however she has a significant divot distally. This has adherent debris in it. I been using debridement and silver collagen to try and get this area to fill-in although I do not think we have made much progress this week 9/24; the patient's wound on the left medial ankle looks a lot better. The deeper divot area distally still requires debridement but this is cleaning up quite nicely we have been using silver collagen. The patient is complaining of swelling in her foot and is worried that that is contributing to the nonhealing of the ankle wound. She is also complaining of numbness in her anterior toes 10/4; left medial ankle. The small area  distally still has a divot with necrotic material that I have been debriding away. This has an undermining area. She is approved for Apligraf. She saw Dr. [name] her surgeon on 10/1. I think he declared himself is satisfied with the condition of things. Still nonweightbearing till the end of the month. 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 Her surgical wound is well closed. There is no evidence of infection 10/11; the patient arrived in clinic today with the expectation that we be able to put an Apligraf on this area after debridement however she arrives with a relatively large amount of green drainage on the dressing. The patient states that this started on Friday. She has not been systemically unwell. 10/19; culture I did last week showed both Enterococcus and Pseudomonas. I think this came in separate parts because I stopped her ciprofloxacin I gave her and prescribed her linezolid however now looking at the final culture result this is Pseudomonas which is resistant to quinolones. She has not yet picked up the linezolid apparently phone issues. We are also trying to get a topical antibiotic out of [facility] in [place] they can be applied by home health. She is still having green drainage 10/16; the patient has her topical antibiotic from [facility] in [place]. This is a compounded gel with vancomycin and ciprofloxacin and gentamicin. We are applying this on the wound bed with silver alginate over the top with Unna boot wraps. She arrives in clinic today with a lot less ominous looking drainage although she is only use this topical preparation once the second time today. She sees Dr. [name] her surgeon on Friday she has home health changing the dressing 11/2; still using her compounded topical antibiotic under silver alginate. Surface is cleaning up there is less drainage. We had an Apligraf for her today and I elected to apply it. A light coating of  her antibiotic 04/25/2020 upon evaluation today patient appears to be doing well with regard to her ankle ulcer. There is a little bit of slough noted on the surface of the wound I am can have to perform sharp debridement to clear this away today. With that being said other than that fact overall I feel like she is making progress and we do see some new epithelial growth. There is also some improvement in the depth of the wound and that distal portion. There is little bit of slough there as well. 12/7; 2-week follow-up. Apligraf #3. Dimensions are smaller. Closing in especially  inferiorly. Still some surface debris. Still using the [facility] topical antibiotic but I told her that I don't think this needs to be renewed 12/21; 2-week follow-up. Apligraf #4 dimensions are smaller. Nice improvement 06/05/2020; 2-week follow-up. The patient's wound on the left medial ankle looks really excellent. Nice granulation. Advancing epithelialization no undermining no evidence of infection. We would have to reapply for another Apligraf but with the condition of this wound I did not feel strongly about it. We used Hydrofera Blue under the same degree of compression. She follows up with Dr. [name] her surgeon a week Friday 06/13/2020 upon evaluation today patient appears to be doing excellent in regard to her wound. She has been tolerating the dressing changes without complication. Fortunately there is no signs of active infection at this time. No fevers, chills, nausea, vomiting, or diarrhea. She was using Hydrofera Blue last week. 06/20/2020 06/20/2020 on evaluation today patient actually appears to be doing excellent in regard to her wound. This is measuring better and looking much better as well. She has been using the collagen that seems to be doing better for her as well even though the [name] was and is not sticking or feeling as rough on her wound. She did see Dr. [name] on Friday he is very pleased he also  stated none of the hardware has shifted. That is great news 1/27; the patient has a small clean wound all that is remaining. I agree that this is too small to really consider an Apligraf. Under illumination the surface is looking quite good. We have been using collagen although the dimensions are not any better this week 3/2; the patient has a small clean wound on the left medial ankle. Although this left of her substantial original areas. Measurements are smaller. We have been using polymen Ag under an Haematologist. 3/10; small area on the left medial ankle. This looks clean nothing to debride however dimensions are about the same we have been using polymen I think now for 2 weeks Electronic Signature(s) Signed: 07/12/2020 5:13:40 PM By: Linton Ham MD Entered By: Linton Ham on 07/12/2020 13:36:17 -------------------------------------------------------------------------------- Physical Exam Details Patient Name: Date of Service: [name], [name]. 07/12/2020 12:30 PM Medical Record Number: [id number] Patient Account Number: [id number] Date of Birth/Sex: Treating RN: [birth year] (65 y.o. [name] Primary Care Provider: Lennie [name] Other Clinician: Referring Provider: Treating Provider/Extender: [name] in Treatment: 25 Constitutional Sitting or standing Blood Pressure is within target range for patient.. Pulse regular and within target range for patient.Marland Kitchen Respirations regular, non-labored and within target range.. Temperature is normal and within the target range for the patient.Marland Kitchen Appears in no distress. Cardiovascular Pedal pulses are palpable. Notes Wound exam; no need for debridement. There was nothing on the surface even with gentle probing with a curette. There is no evidence of surrounding infection we have good edema control Electronic Signature(s) Signed: 07/12/2020 5:13:40 PM By: Linton Ham MD Entered By: Linton Ham on  07/12/2020 13:38:13 -------------------------------------------------------------------------------- Physician Orders Details Patient Name: Date of Service: [name], [name]. 07/12/2020 12:30 PM Medical Record Number: [id number] Patient Account Number: [id number] Date of Birth/Sex: Treating RN: Aug 13, 1955 (65 y.o. [name] Primary Care Provider: Lennie [name] Other Clinician: Referring Provider: Treating Provider/Extender: [name] in Treatment: 25 Verbal / Phone Orders: No Diagnosis Coding ICD-10 Coding Code Description [phone] Chronic venous hypertension (idiopathic) with ulcer and inflammation of left lower extremity L97.828 Non-pressure chronic ulcer  of other part of left lower leg with other specified severity L97.328 Non-pressure chronic ulcer of left ankle with other specified severity Follow-up Appointments Return Appointment in 1 week. Bathing/ Shower/ Hygiene May shower with protection but do not get wound dressing(s) wet. Edema Control - Lymphedema / SCD / Other Elevate legs to the level of the heart or above for 30 minutes daily and/or when sitting, a frequency of: Avoid standing for long periods of time. Exercise regularly Compression stocking or Garment 20-30 mm/Hg pressure to: - right leg daily Wound Treatment Wound #15 - Malleolus Wound Laterality: Left, Medial Cleanser: Soap and Water 1 x Per Week/15 Days Discharge Instructions: May shower and wash wound with dial antibacterial soap and water prior to dressing change. Peri-Wound Care: Sween Lotion (Moisturizing lotion) 1 x Per Week/15 Days Discharge Instructions: Apply moisturizing lotion as directed Peri-Wound Care: Triamcinolone 15 ([name]) 1 x Per Week/15 Days Discharge Instructions: Use triamcinolone 15 ([name]) as directed Prim Dressing: PolyMem Silver Non-Adhesive Dressing, 4.25x4.25 in 1 x Per Week/15 Days ary Discharge Instructions: Apply to wound bed as instructed Secondary  Dressing: Woven Gauze Sponge, Non-Sterile 4x4 in ([place]) 1 x Per Week/15 Days Discharge Instructions: Apply over primary dressing as directed. Secondary Dressing: ABD Pad, 5x9 (Home Health) 1 x Per Week/15 Days Discharge Instructions: Apply over primary dressing as directed. Compression Wrap: Unnaboot w/Calamine, 4x10 (in/yd) (Home Health) 1 x Per Week/15 Days Discharge Instructions: Apply Unnaboot as directed. Electronic Signature(s) Signed: 07/12/2020 5:13:40 PM By: Linton Ham MD Signed: 07/12/2020 6:08:01 PM By: Deon Pilling Entered By: Deon Pilling on 07/12/2020 13:28:58 -------------------------------------------------------------------------------- Problem List Details Patient Name: Date of Service: [name], [name]. 07/12/2020 12:30 PM Medical Record Number: [id number] Patient Account Number: [id number] Date of Birth/Sex: Treating RN: [birth year] (65 y.o. [name] Primary Care Provider: Lennie [name] Other Clinician: Referring Provider: Treating Provider/Extender: [name] in Treatment: 25 Active Problems ICD-10 Encounter Code Description Active Date MDM Diagnosis I87.332 Chronic venous hypertension (idiopathic) with ulcer and inflammation of left 01/17/2020 No Yes lower extremity L97.828 Non-pressure chronic ulcer of other part of left lower leg with other specified 01/17/2020 No Yes severity L97.328 Non-pressure chronic ulcer of left ankle with other specified severity 01/17/2020 No Yes Inactive Problems ICD-10 Code Description Active Date Inactive Date L03.116 Cellulitis of left lower limb 01/31/2020 01/31/2020 T81.31XD Disruption of external operation (surgical) wound, not elsewhere classified, subsequent 01/17/2020 01/17/2020 encounter Resolved Problems Electronic Signature(s) Signed: 07/12/2020 5:13:40 PM By: Linton Ham MD Entered By: Linton Ham on 07/12/2020  13:34:51 -------------------------------------------------------------------------------- Progress Note Details Patient Name: Date of Service: [name], [name]. 07/12/2020 12:30 PM Medical Record Number: [id number] Patient Account Number: [id number] Date of Birth/Sex: Treating RN: [birth year] (65 y.o. [name] Primary Care Provider: Lennie [name] Other Clinician: Referring Provider: Treating Provider/Extender: [name] in Treatment: 25 Subjective History of Present Illness (HPI) the remaining wound is over the left medial ankle. Similar wound over the right medial ankle healed largely with use of Apligraf. Most recently we have been using Hydrofera Blue over this wound with considerable improvement. The patient has been extensively worked up in the past for her venous insufficiency and she is not a candidate for antireflux surgery although I have none of the details available currently. 08/24/14; considerable improvement today. About 50% of this wound areas now epithelialized. The base of the wound appears to be healthier granulation.as opposed to last week when she had deteriorated a considerable  improvement 08/17/14; unfortunately the wound has regressed somewhat. The areas of epithelialization from the superior aspect are not nearly as healthy as they were last week. The patient thinks her Hydrofera Blue slipped. 09/07/14; unfortunately the area has markedly regressed in the 2 weeks since I've seen this. There is an [name] surrounding erythema. The healthy granulation tissue that we had at the base of the wound now is a dusky color. The nurse reports green drainage 09/14/14; the area looks somewhat better than last week. There is less erythema and less drainage. The culture I did did not show any growth. Nevertheless I think it is better to continue the Cipro and doxycycline for a further week. The remaining wound area was debridement. 09/21/14. Wound did  not require debridement last week. Still less erythema and less drainage. She can complete her antibiotics. The areas of epithelialization in the superior aspect of the wound do not look as healthy as they did some weeks ago 10/05/14 continued improvement in the condition of this wound. There is advancing epithelialization. Less aggressive debridement required 10/19/14 continued improvement in the condition and volume of this wound. Less aggressive debridement to the inferior part of this to remove surface slough and fibrinous eschar 11/02/14 no debridement is required. The surface granulation appears healthy although some of her islands of epithelialization seem to have regressed. No evidence of infection 11/16/14; lites surface debridement done of surface eschar. The wound does not look to be unhealthy. No evidence of infection. Unfortunately the patient has had podiatry issues in the right foot and for some reason has redeveloped small surface ulcerations in the medial right ankle. Her original presentation involved wounds in this area 11/23/14 no debridement. The area on the right ankle has enlarged. The left ankle wound appears stable in terms of the surface although there is periwound inflammation. There has been regression in the amount of new skin 11/30/14 no debridement. Both wound areas appear healthy. There was no evidence of infection. The the new area on the right medial ankle has enlarged although that both the surfaces appear to be stable. 12/07/14; Debridement of the right medial ankle wound. No no debridement was done on the left. 12/14/14 no major change in and now bilateral medial ankle wounds. Both of these are very painful but the no overt evidence of infection. She has had previous venous ablation 12/21/14; patient states that her right medial ankle wound is considerably more painful last week than usual. Her left is also somewhat painful. She could not tolerate debridement. The right  medial ankle wound has fibrinous surface eschar 12/28/14 this is a patient with severe bilateral venous insufficiency ulcers. For a considerable period of time we actually had the one on the right medial ankle healed however this recently opened up again in June. The left medial ankle wound has been a refractory area with some absent flows. We had some success with Hydrofera Blue on this area and it literally closed by 50% however it is recently opened up Foley. Both of these were debridement today of surface eschar. She tolerates this poorly 01/25/15: No change in the status of this. Thick adherent escar. Very poor tolerance of any attempt at debridement. I had healed the right medial malleolus wound for a considerable amount of time and had the left one down to about 50% of the volume although this is totally regressed over the last 48 weeks. Further the right leg has reopened. she is trying to make a appointment with pain and  vascular, previous ablations with [name]. I do not believe there is an arterial insufficiency issue here 02/01/15 the status of the adherent eschar bilaterally is actually improved. No debridement was done. She did not manage to get vascular studies done 02/08/15 continued debridement of the area was done today. The slough is less adherent and comes off with less pressure. There is no surrounding infection peripheral pulses are intact 02/15/15 selective debridement with a disposable curette. Again the slough is less adherent and comes off with less difficulty. No surrounding infection peripheral pulses are intact. 02/22/15 selective debridement of the right medial ankle wound. Slough comes off with less difficulty. No obvious surrounding infection peripheral pulses are intact I did not debridement the one on the left. Both of these are stable to improved 03/01/15 selective debridement of both wound areas using a curette to. Adherent slough cup soft with less difficulty. No  obvious surrounding infection. The patient tells me that 2 days ago she noted a rash above the right leg wrap. She did not have this on her lower legs when she change this over she arrives with widespread left greater than right almost folliculitis-looking rash which is extremely pruritic. I don't see anything to culture here. There is no rash on the rest of her body. She feels well systemically. 03/08/15; selective debridement of both wounds using a curette. Base of this does not look unhealthy. She had limegreen drainage coming out of the left leg wound and describes a lot of drainage. The rash on her left leg looks improved to. No cultures were done. 03/22/15; patient was not here last week. Basal wounds does not look healthy and there is no surrounding erythema. No drainage. There is still a rash on the left leg that almost looks vasculitic however it is clearly limited to the top of where the wrap would be. 04/05/15; on the right required a surgical debridement of surface eschar and necrotic subcutaneous tissue. I did not debridement the area on the left. These continue to be large open wounds that are not changing that much. We were successful at one point in healing the area on the right, and at the same time the area on the left was roughly half the size of current measurements. I think a lot of the deterioration has to do with the prolonged time the patient is on her feet at work 04/19/15 I attempted-like surface debridement bilaterally she does not tolerate this. She tells me that she was in allergic care yesterday with extreme pain over her left lateral malleolus/ankle and was told that she has an "sprain" 05/03/15; large bilateral venous insufficiency wounds over the medial malleolus/medial aspect of her ankles. She complains of copious amounts of drainage and his usual large amounts of pain. There is some increasing erythema around the wound on the right extending into the medial aspect of  her foot to. historically she came in with these wounds the right one healed and the left one came down to roughly half its current size however the right one is reopened and the left is expanded. 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 05/10/15 large bilateral venous insufficiency wounds. There is less adherence surface left however the surface culture that I did last week grew pseudomonas therefore bilateral selective debridement score necessary. There is surrounding erythema. The patient describes severe bilateral drainage and a lot of pain in the left ankle. Apparently her  podiatrist was were ready to do a cortisone shot 05/17/15; the patient complains of pain and again copious amounts of drainage. 05/24/15; we used Iodo flex last week. Patient notes considerable improvement in wound drainage. Only needed to change this once. 05/31/15; we continued Iodoflex; the base of these large wounds bilaterally is not too bad but there is probably likely a significant bioburden here. I would like to debridement just doesn't tolerate it. 06/06/14 I would like to continue the Iodoflex although she still hasn't managed to obtain supplies. She has bilateral medial malleoli or large wounds which are mostly superficial. Both of them are covered circumferentially with some nonviable fibrinous slough although she tolerates debridement very poorly. She apparently has an appointment for an ablation on the right leg by interventional radiology. 06/14/15; the patient arrives with the wounds and static condition. We attempted a debridement although she does not do well with this secondary to pain. I 07/05/15; wounds are not much smaller however there appears to be a cleaner granulating base. The left has tight fibrinous slough greater than the right. Debridement is tolerated poorly due to pain. Iodoflex is done more for these wounds in any of the multitude of different  dressings I have tried on the left 1 and then subsequently the right. 07/12/15; no change in the condition of this wound. I am able to do an aggressive debridement on the right but not the left. She simply cannot tolerate it. We have been using Iodoflex which helps somewhat. It is worthwhile remembering that at one point we healed the right medial ankle wound and the left was about 25% of the current circumference. We have suggested returning to vascular surgery for review of possible further ablations for one reason or another she has not been able to do this. 07/26/15 no major change in the condition of either wound on her medial ankle. I did not attempt to debridement of these. She has been aggressively scrubbing these while she is in the shower at home. She has her supply of Iodoflex which seems to have done more for these wounds then anything I have put on recently. 08/09/15 wound area appears larger although not verified by measurements. Using Iodoflex 09/05/2015 -- she was here for avisit today but had significant problems with the wound and I was asked to see her for a physician opinion. I have summarize that this lady has had surgery on her left lower extremity about 10 years ago where the possible veins stripping was done. She has had an opinion from interventional radiology around November 2016 where no further sclerotherapy was ordered. The patient works 12 hours a day and stands on a concrete floor with work boots and is unable to get the proper compression she requires and cannot elevate her limbs appropriately at any given time. She has recently grown Pseudomonas from her wound culture but has not started her ciprofloxacin which was called in for her. 09/13/15 this continues to be a difficult situation for this patient. At one point I had this wound down to a 1.5 x 1.5" wound on her left leg. This is deteriorated and the right leg has reopened. She now has substantial wounds on her medial  calcaneus, malleoli and into her lower leg. One on the left has surface eschar but these are far too painful for me to debridement here. She has a vascular surgery appointment next week to see if anything can be done to help here. I think she has had previous ablations  several years ago at [facility]. She has no major edema. She tells me that she did not get product last time [facility] and went for several days without it. She continues to work in work boots 12 hours a day. She cannot get compression/4-layer under her work boots. 09/20/15 no major change. Periwound edema control was not very good. Her point with pain and vascular is next Wednesday the 25th 09/28/15; the patient is seen vascular surgery and is apparently scheduled for repeat duplex ultrasounds of her bilateral lower legs next week. 10/05/15; the patient was seen by Dr. Doren Custard of vascular surgery. He feels that she should have arterial insufficiency excluded as cause/contributed to her nonhealing stage she is therefore booked for an arteriogram. She has apparently monophasic signals in the dorsalis pedis pulses. She also of course has known severe chronic venous insufficiency with previous procedures as noted previously. I had another long discussion with the patient today about her continuing to work 12 hour shifts. I've written her out for 2 months area had concerns about this as her work location is currently undergoing significant turmoil and this may lead to her termination. She is aware of this however I agree with her that she simply cannot continue to stand for 12 hours multiple days a week with the substantial wound areas she has. 10/19/15; the Dr. Doren Custard appointment was largely for an arteriogram which was normal. She does not have an arterial issue. He didn't make a comment about her chronic venous insufficiency for which she has had previous ablations. Presumably it was not felt that anything additional could be done. The  patient is now out of work as I prescribed 2 weeks ago. Her wounds look somewhat less aggravated presumably because of this. I felt I would give debridement another try today 10/25/15; no major change in this patient's wounds. We are struggling to get her product that she can afford into her own home through her insurance. 11/01/15; no major change in the patient's wounds. I have been using silver alginate as the most affordable product. I spoke to Dr. Marla Roe last week with her requested take her to the OR for surgical debridement and placement of ACEL. Dr. Marla Roe told me that she would be willing to do this however [name] will not cover this, fortunately the patient has [place] healthcare of some variant 11/08/15; no major change in the patient's wounds. She has been completely nonviable surface that this but is in too much pain with any attempted debridement are clinic. I have arranged for her to see Dr. Marla Roe ham of plastic surgery and this appointment is on Monday. I am hopeful that they will take her to the OR for debridement, possible ACEL ultimately possible skin graft 11/22/15 no major change in the patient's wounds over her bilateral medial calcaneus medial malleolus into the lower legs. Surface on these does not look too bad however on the left there is surrounding erythema and tenderness. This may be cellulitis or could him sleepy tinea. 11/29/15; no major changes in the patient's wounds over her bilateral medial malleolus. There is no infection here and I don't think any additional antibiotics are necessary. There is now plan to move forward. She sees Dr. Marla Roe in a week's time for preparation for operative debridement and ACEL placement I believe on 7/12. She then has a follow-up appointment with Dr. Marla Roe on 7/21 12/28/15; the patient returns today having been taken to the [facility] by Dr. Marla Roe 12/12/15 she  underwent debridement, intraoperative cultures [which  were negative]. She had placement of a wound VAC. Parent really ACEL was not available to be placed. The wound VAC foam apparently adhered to the wound since then she's been using silver alginate, Xeroform under Ace wraps. She still says there is a lot of drainage and a lot of pain 01/31/16; this is a patient I see monthly. I had referred her to Dr. Marla Roe him of plastic surgery for large wounds on her bilateral medial ankles. She has been to the OR twice once in early July and once in early August. She tells me over the last 3 weeks she has been using the wound VAC with ACEL underneath it. On the right we've simply been using silver alginate. Under Kerlix Coban wraps. 02/28/16; this is a patient I'm currently seeing monthly. She is gone on to have a skin graft over her large venous insufficiency ulcer on the left medial ankle. This was done by Dr. Marla Roe him. The patient is a bit perturbed about why she didn't have one on her right medial ankle wound. She has been using silver alginate to this. 03/06/16; I received a phone call from her plastic surgery Dr. Marla Roe. She expressed some concern about the viability of the skin graft she did on the left medial ankle wound. Asked me to place Endoform on this. She told me she is not planning to do a subsequent skin graft on the right as the left one did not take very well. I had placed Hydrofera Blue on the right 03/13/16; continue to have a reasonably healthy wound on the right medial ankle. Down to 3 mm in terms of size. There is epithelialization here. The area on the left medial ankle is her skin graft site. I suppose the last week this looks somewhat better. She has an open area inferiorly however in the center there appears to be some viable tissue. There is a lot of surface callus and eschar that will eventually need to come off however none of this looked to be infected. Patient states that the is able to keep the dressing on for several  days which is an improvement. 03/20/16 no major change in the circumference of either wound however on the left side the patient was at Dr. Eusebio Friendly office and they did a debridement of left wound. 50% of the wound seems to be epithelialized. I been using Endoform on the left Hydrofera Blue in the right 03/27/16; she arrives today with her wound is not looking as healthy as they did last week. The area on the right clearly has an adherent surface to this a very similar surface on the left. Unfortunately for this patient this is all too familiar problem. Clearly the Endoform is not working and will need to change that today that has some potential to help this surface. She does not tolerate debridement in this clinic very well. She is changing the dressing wants 04/03/16; patient arrives with the wounds looking somewhat better especially on the right. Dr. Migdalia Dk change the dressing to silver alginate when she saw her on Monday and also sold her some compression socks. The usefulness of the latter is really not clear and woman with severely draining wounds. 04/10/16; the patient is doing a bit of an experiment wearing the compression stockings that Dr. Migdalia Dk provided her to her left leg and the out of legs based dressings that we provided to the right. 05/01/16; the patient is continuing to wear compression stockings Dr.  Sanger provided her on the left that are apparently silver impregnated. She has been using Iodoflex to the right leg wound. Still a moderate amount of drainage, when she leaves here the wraps only last for 4 days. She has to change the stocking on the left leg every night 05/15/16; she is now using compression stockings bilaterally provided by Dr. Marla Roe. She is wearing a nonadherent layer over the wounds so really I don't think there is anything specific being done to this now. She has some reduction on the left wound. The right is stable. I think all healing here is being  done without a specific dressing 06/09/16; patient arrives here today with not much change in the wound certainly in diameter to large circular wounds over the medial aspect of her ankle bilaterally. Under the light of these services are certainly not viable for healing. There is no evidence of surrounding infection. She is wearing compression stockings with some sort of silver impregnation as prescribed by Dr. Marla Roe. She has a follow-up with her tomorrow. 06/30/16; no major change in the size or condition of her wounds. These are still probably covered with a nonviable surface. She is using only her purchase stockings. She did see Dr. Marla Roe who seemed to want to apply Dakin's solution to this I'm not extreme short what value this would be. I would suggest Iodoflex which she still has at home. 07/28/16; I follow Mrs. Alvardo episodically along with Dr. Marla Roe. 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 She has been applying a topical collagen ointment to both wounds with Adaptic. I don't think Dr. Marla Roe is planning to take her back to the OR. 08/19/16; I follow [name] on a monthly basis along with Dr. Marla Roe of plastic surgery. 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 I been following her for a number of years. At one point I was able to get the right medial malleolus wound to heal and had the left medial malleolus down to about half its current size however and I had to send her to plastic surgery for an operative debridement. Since then things have been stable to slightly improve the area on the right is slightly better one in the left about the same although there is much less adherent surface than I'm used to with this patient. She is using some form of liquid collagen gel that Dr. Marla Roe provided a Kerlix cover with the patient's  own pressure stockings. She tells me that she has extreme pain in both ankles and along the lateral aspect of both feet. She has been unable to work for some period of time. She is telling me she is retiring at the beginning of April. She sees Dr. Doran Durand of orthopedics next week 09/22/16; patient has not seen Dr. Marla Roe since the last time she is here. I'm not really sure what she is using to the wounds other than bits and pieces of think she had left over including most recently Hydrofera Blue. She is using juxtalite stockings. She is having difficulty with her husband's recent illness "stroke". She is having to transport him to various doctors appointments. Dr. Marla Roe left her the option of a repeat debridement with ACEL however she has not been able to get the time to follow-up on this. She continues to have a fair amount of drainage out of these wounds with certainly precludes leaving dressings on  all week 10/13/16; patient has not seen Dr. Marla Roe since she was last in our clinic. I'm not really sure what she is doing with the wounds, we did try to get her [name] and I think she is actually using this most of the time. Because of drainage she states she has to change this every second day although this is an improvement from what she used to do. She went to see Dr. Doran Durand who did not think she had a muscular issue with regards to her feet, he referred her to a neurologist and I think the appointment is sometime in June. I changed her back to Iodoflex which she has used in the past but not recently. 11/03/16; the patient has been using Iodoflex although she ran out of this. Still claims that there is a lot of drainage although the wound does not look like this. No surrounding erythema. She has not been back to see Dr. Marla Roe 11/24/16; the patient has been using Iodoflex again but she ran out of it 2 or 3 days ago. There is no major change in the condition of either one of  these wounds in fact they are larger and covered in a thick adherent surface slough/nonviable tissue especially on the left. She does not tolerate mechanical debridement in our clinic. Going back to see Dr. Marla Roe of plastic surgery for an operative debridement would seem reasonable. 12/15/16; the patient has not been back to see Dr. Marla Roe. She is been dealing with a series of illnesses and her husband which of monopolized her time. She is been using Sorbact which we largely supplied. She states the drainage is bad enough that it maximum she can go 2-3 days without changing the dressing 01/12/2017 -- the patient has not been back for about 4 weeks and has not seen Dr. Marla Roe not does she have any appointment pending. 01/23/17; patient has not seen Dr. Marla Roe even though I suggested this previously. She is using Santyl that was suggested last week by Dr. Con Memos this Cost her $16 through her insurance which is indeed surprising 02/12/17; continuing Santyl and the patient is changing this daily. A lot of drainage. She has not been back to see plastic surgery she is using an Ace wrap. Our intake nurse suggested wrap around stockings which would make a good reasonable alternative 02/26/17; patient is been using Santyl and changing this daily due to drainage. She has not been to see plastic surgery she uses in April Ace wrap to control the edema. She did obtain extremitease stockings but stated that the edema in her leg was to big for these 03/20/17; patient is using Santyl and Anasept. Surfaces looked better today the area on the right is actually measuring a little smaller. She has states she has a lot of pain in her feet and ankles and is asking for a consult to pain control which I'll try to help her with through our case manager. 04/10/17; the patient arrives with better-looking wound surfaces and is slightly smaller wound on the left she is using a combination of Santyl and Anasept.  She has an appointment or at least as started in the pain control center associated with [facility] 05/14/17; this is a patient who I followed for a prolonged period of time. 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 At one point I had this down to a much smaller wound on the left however these reopened and we've never been able to get these  to heal. She has been using Santyl and Anasept gel although 2 weeks ago she ran out of the Anasept gel. She has a stable appearance of the wound. She is going to the wound care clinic at [name]. They wanted do a nerve block/spinal block although she tells me she is reluctant to go forward with that. 05/21/17; this is a patient I have followed for many years. 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 Chronic pain and deformity in her ankles as well. She is been to see plastic surgery as well as orthopedics. Using PolyMem AG most recently/Kerramax/ABDs and 2 layer compression. She has managed to keep this on and she is coming in for a nurse check to change the dressing on Tuesdays, we see her on Fridays 06/05/17; really quite a good looking surface and the area especially on the right medial has contracted in terms of dimensions. Well granulated healthy-looking tissue on both sides. Even with an open curet there is nothing that even feels abnormal here. This is as good as I've seen this in quite some time. We have been using PolyMem AG and bringing her in for a nurse check 06/12/17; really quite good surface on both of these wounds. The right medial has contracted a bit left is not. We've been using PolyMem and AG and she is coming in for a nurse visit 06/19/17; we have been using PolyMem AG and bringing her in for a nurse check. Dimensions of her wounds are not better but the surfaces looked better bilaterally. She complained of bleeding last night and the left wound and increasing pain bilaterally.  She states her wound pain is more neuropathic than just the wounds. There was some suggestion that this was radicular from her pain management doctor in talking to her it is really difficult to sort this out. 06/26/17; using PolyMem and AG and bringing her in for a nurse check as All of this and reasonably stable condition. Certainly not improved. The dimensions on the lateral part of the right leg look better but not really measuring better. The medial aspect on the left is about the same. 07/03/16; we have been using PolyMen AG and bringing her in for a nurse check to change the dressings as the wounds have drainage which precludes once weekly changing. We are using all secondary absorptive dressings.our intake nurse is brought up the idea of using a wound VAC/snap VAC on the wound to help with the drainage to see if this would result in some contraction. This is not a bad idea. The area on the right medial is actually looking smaller. Both wounds have a reasonable-looking surface. There is no evidence of cellulitis. The edema is well controlled 07/10/17; the patient was denied for a snap VAC by her insurance. The major issue with these wounds continues to be drainage. We are using wicked PolyMem AG and she is coming in for a nurse visit to change this. The wounds are stable to slightly improved. The surface looks vibrant and the area on the right certainly has shrunk in size but very slowly 07/17/17; the patient still has large wounds on her bilateral medial malleoli. Surface of both of these wounds looks better. The dimensions seem to come and go but no consistent improvement. There is no epithelialization. We do not have options for advanced treatment products due to insurance issues. They did not approve of the wound VAC to help control the drainage. More recently we've been using PolyMem  and AG wicked to allow drainage through. We have been bringing her in for a nurse visit to change this. We do not  have a lot of options for wound care products and the home again due to insurance issues 07/24/17; the patient's wound actually looks somewhat better today. No drainage measurements are smaller still healthy-looking surface. We used silver collagen under PolyMen started last week. We have been bringing her in for a dressing change 07/31/17; patient's wound surface continued to look better and I think there is visible change in the dimensions of the wound on the right. Rims of epithelialization. We have been using silver collagen under PolyMen and bringing her in for a dressing change. There appears to be less drainage although she is still in need of the dressing change 08/07/17. Patient's wound surface continues to look better on both sides and the area on the right is definitely smaller. We have been using silver collagen and PolyMen. She feels that the drainage has been it has been better. I asked her about her vascular status. She went to see [name] at [facility] and had some form of ablation. I don't have much detail on this. I haven't my notes from 2016 that she was not a candidate for any further ablation but I don't have any more information on this. We had referred her to vein and vascular I don't think she ever went. He does not have a history of PAD although I don't have any information on this either. We don't even have ABIs in our record 08/14/17; we've been using silver collagen and PolyMen cover. And putting the patient and compression. She we are bringing her in as a nurse visit to change this because ofarge amount of drainage. We didn't the ABIs in clinic today since they had been done in many moons 1.2 bilaterally. She has been to see vein and vascular however this was at [facility] and she had ablation although I really don't have any information on this all seemed biking get a report. She is also been operatively debrided by plastic surgery and had a cell placed probably  8-12 months ago. This didn't have a major effect. We've been making some gains with current dressings 08/19/17-She is here in follow-up evaluation for bilateral medial malleoli ulcers. She continues to tolerate debridement very poorly. We will continue with recently changed topical treatment; if no significant improvement may consider switching to Iodosorb/Iodoflex. 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 She has been using silver collagen and PolyMem. I believe she has been used and tried on Iodoflex before. 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 These have been very refractory over time. We have been using silver collagen and PolyMen. She is coming in once a week for a doctor's and once a week for nurse visits. We are actually making some progress 09/18/17; the patient's wounds are smaller especially on the right medial. She arrives today to upset to consider even washing these off with Anasept which I think is been part of the reason this is been closing. We've been using collagen covered in PolyMen otherwise. It is noted that she has a small area of folliculitis on the right medial calf that. As we are wrapping her legs I'll give her a  short course of doxycycline to make sure this doesn't amount to anything. She is a long list of complaints today including imbalance, shortness of breath on exertion, inversion of her left ankle. With regards to the latter complaints she is been to see orthopedics and they offered her a tendon release surgery I believe but wanted her wounds to be closed first. I have recommended she go see her primary physician with regards to everything else. 09/25/17; patient's wounds are about the same size. We have made some progress  bilaterally although not in recent weeks. She will not allow me T wash these wounds with Anasept even if she is doing her cell. Wheeze we've been using collagen covered in PolyMen. Last week she had a small area of folliculitis this is now opened into a small wound. She completed 5 days of trimethoprim sulfamethoxazole 10/02/17; unfortunately the area on her left medial ankle is worse with a larger wound area towards the Achilles. The patient complains of a lot of pain. She will not allow debridement although visually I don't think there is anything to debridement in any case. We have been using silver collagen and PolyMen for several months now. Initially we are making some progress although I'm not really seeing that today. We will move back to [name]. His admittedly this is a bit of a repeat however I'm hoping that his situation is different now. The patient tells me she had her leg on the left give out on her yesterday this is process some pain. 10/09/17; the patient is seen twice a week largely because of drainage issues coming out of the chronic medial bimalleolar wounds that are chronic. Last week the dimensions of the one on the left looks a little larger I changed her to [name]. She comes in today with a history of terrible pain in the bilateral wound areas. She will not allow debridement. She will not even allow a tissue culture. There is no surrounding erythema no no evidence of cellulitis. We have been putting her Kerlix Coban man. She will not allow more aggressive compression as there was a suggestion to put her in 3 layer wraps. 10/16/17; large wounds on her bilateral medial malleoli. These are chronic. Not much change from last week. The surface looks have healthy but absolutely no epithelialization. A lot of pain little less so of drainage. She will not allow debridement or even washing these off in the vigorous fashion with Anasept. 10/23/17; large wounds on her bilateral  malleoli which are chronic. Some improvement in terms of size perhaps on the right since last time I saw these. She states that after we increased the 3 layer compression there was some bleeding, when she came in for a nurse visit she did not want 3 layer compression put back on about our nurse managed to convince her. She has known chronic venous visit issues and I'm hoping to get her to tolerate the 3 layer compression. using Hydrofera Blue 10/30/17; absolutely no change in the condition of either wound although we've had some improvement in dimensions on the right.. Attempted to put her in 3 layer compression she didn't tolerated she is back in 2 layer compression. We've been using Hydrofera Blue We looked over her past records. She had venous reflux studies in November 2016. There was no evidence of deep venous reflux on the right. Superficial vein did not show the greater saphenous vein at think this is been previously ablated the small saphenous vein was within  normal limits. The left deep venous system showed no DVT the vessels were positive for deep venous reflux in the posterior tibial veins at the ankle. The greater saphenous vein was surgically absent small saphenous vein was within normal limits. She went to vein and vascular at [facility]. I believe she had an ablation on the left greater saphenous vein. I'll update her reflux studies perhaps ever reviewed by vein and vascular. We've made absolutely no progress in these wounds. Will also try to read and TheraSkins through her insurance 11/06/17; W the patient apparently has a 2 week follow-up with vein and vascular I like him to review the whole issue with regards to her previous vascular workup by [name]. We've really made no progress on these wounds in many months. She arrives today with less viable looking surface on the left medial ankle wound. This was apparently looking about the same on Tuesday when she was here for nurse  visit. 11/13/17; deep tissue culture I did last time of the left lower leg showed multiple organisms without any predominating. In particular no Staphylococcus or group A strep were isolated. We sent her for venous reflux studies. She's had a previous left greater saphenous vein stripping and I think sclerotherapy of the right greater saphenous vein. She didn't really look at the lesser saphenous vein this both wounds are on the medial aspect. She has reflux in the common femoral vein and popliteal vein and an accessory vein on the right and the common femoral vein and popliteal vein on the left. I'm going to have her go to see vein and vascular just the look over things and see if anything else beside aggressive compression is indicated here. We have not been able to make any progress on these wounds in spite of the fact that the surface of the wounds is never look too bad. 11/20/17; no major change in the condition of the wounds. Patient reports a large amount of drainage. She has a lot of complaints of pain although enlisting her today I wonder if some of this at least his neuropathic rather than secondary to her wounds. She has an appointment with vein and vascular on 12/30/17. The refractory nature of these wounds in my mind at least need vein and vascular to look over the wounds the recent reflux studies we did and her history to see if anything further can be done here. I also note her gait is deteriorated quite a bit. 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 She has a bilateral Trendelenburg gait. I wonder if this is neuropathic or perhaps multilevel radicular. 11/27/17; her wounds actually looks slightly better. Healthy-looking granulation tissue a scant amount of epithelialization. [place] healthcare will not pay for [employer]. They will play for tri layer Oasis and Dermagraft. This is not a diabetic ulcer. We'll try for the tri layer Oasis. She still complains of some drainage. She  has a vein and vascular appointment on 12/30/17 12/04/17; the wounds visually look quite good. Healthy-looking granulation with some degree of epithelialization. We are still waiting for response to our request for trial to try layer Oasis. Her appointment with vascular to review venous and arterial issues isn't sold the end of July 7/31. Not allow debridement or even vigorous cleansing of the wound surface. 12/18/17; slightly smaller especially on the right. 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 We've been using Hydrofera Blue. We still are looking into triple layer Oasis through her insurance 01/08/18  on evaluation today patient's wound actually appears to be showing signs of good improvement at this point in time. She has been tolerating the dressing changes without complication. Fortunately there does not appear to be any evidence of infection at this point in time. We have been utilizing silver nitrate which does seem to be of benefit for her which is also good news. Overall I'm very happy with how things seem to be both regards appearance as well as measurement. Patient did see Dr. Bridgett Larsson for evaluation on 12/30/17. In his assessment he felt that stripping would not likely add much more than chronic compression to the patient's healing process. His recommendation was to follow-up in three months with Dr. Doren Custard if she hasn't healed in order to consider referral back to you and see vascular where she previously was in a trial and was able to get her wound to heal. I'll be see what she feels she when you staying compression and he reiterated this as well. 01/13/18 on evaluation today patient appears to actually be doing very well in regard to her bilateral medial malleolus ulcers. She seems to have tolerated the chemical cauterization with silver nitrate last week she did have some pain through that evening but fortunately states that I'll be see since it seems to be doing  better she is overall pleased with the progress. 01/21/18; really quite a remarkable improvement since I've last seen these wounds. We started using silver nitrate specially on the islands of hyper granulation which for some reason her around the wound circumference. This is really done quite nicely. Primary dressing Hydrofera Blue under 4 layer compression. She seems to be able to hold out without a nurse rewrap. Follow-up in 1 week 01/28/18; we've continued the hydrofera blue but continued with chemical cauterization to the wound area that we started about a month ago for irregular hyper granulation. She is made almost stunning improvement in the overall wound dimensions. I was not really expecting this degree of improvement in these chronic wounds 02/05/18; we continue with Hydrofera Bluebut of also continued the aggressive chemical cauterization with silver nitrate. We made nice progress with the right greater than left wound. 02/12/18. We continued with Hydrofera Blue after aggressive chemical cauterization with silver nitrate. We appear to be making nice progress with both wound areas 02/19/2018; we continue with [name] after washing the wounds vigorously with Anasept spray and chemical cauterization with silver nitrate. We are making excellent progress. The area on the right's just about closed 02/26/2018. The area on the left medial ankle had too much necrotic debris today. I used a #5 curette we are able to get most of the soft. I continued with the silver nitrate to the much smaller wound on the right medial ankle she had a new area on her right lower pretibial area which she says was due to a role in her compression 03/05/2018; both wound areas look healthy. Not much change in dimensions from last week. I continue to use silver nitrate and Hydrofera Blue. The patient saw Dr. Doren Custard of vein and vascular. He felt she had venous stasis ulcers. He felt based on her previous arteriogram she  should have adequate circulation for healing. Also she has deep venous reflux but really no significant correctable superficial venous reflux at this time. He felt we should continue with conservative management including leg elevation and compression 04/02/2018; since we last saw this woman about a month ago she had a fall apparently suffered a pelvic fracture.  I did not look up the x-ray. Nevertheless because of pain she literally was bedbound for 2 weeks and had home health coming out to change the dressing. Somewhat predictably this is resulted in considerable improvement in both wound areas. The right is just about closed on the medial malleolus and the left is about half the size. 04/16/2018; both her wounds continue to go down in size. Using Hydrofera Blue. 05/07/18; both her wounds appeared to be improving especially on the right where it is almost closed. We are using Hydrofera Blue 05/14/2018; slightly worse this week with larger wounds. Surface on the left medial not quite as good. We have been using Hydrofera Blue 05/21/18; again the wounds are slightly larger. Left medial malleolus slightly larger with eschar around the circumference. We have been using Hydrofera Blue undergoing a wraps for a prolonged period of time. This got a lot better when she was more recumbent due to a fall and a back injury. I change the primary dressing the silver alginate today. She did not tolerate a 4 layer compression previously although I may need to bring this up with her next time 05/28/2018; area on the left medial malleolus again is slightly larger with more drainage. Area on the right is roughly unchanged. She has a small area of folliculitis on the right medial just on the lower calf. This does not look ominous. 06/03/2018 left medial malleolus slightly smaller in a better looking surface. We used silver nitrate on this last time with silver alginate. The area on the right appears slightly smaller 1/10;  left medial malleolus slightly smaller. Small open area on the right. We used silver nitrate and silver alginate as of 2 weeks ago. We continue with the wound and compression. These got a lot better when she was off her feet 1/17; right medial malleolus wound is smaller. The left may be slightly smaller. Both surfaces look somewhat better. 1/24; both wounds are slightly smaller. Using silver alginate under Unna boots 1/31; both wounds appear smaller in fact the area on the right medial is just about closed. Surface eschar. We have been using silver alginate under Unna boots. The patient is less active now spends let much less time on her feet and I think this is contributed to the general improvement in the wound condition 2/7; both wounds appear smaller. I was hopeful the right medial would be closed however there there is still the same small open area. Slight amount of surface eschar on the left the dimensions are smaller there is eschar but the wound edges appear to be free. We have been using silver alginate under Unna boot's 2/14; both wounds once again measure smaller. Circumferential eschar on the left medial. We have been using silver alginate under Unna boots with gradual improvement 2/21; the area on the right medial malleolus has healed. The area on the left is smaller. We have been using silver alginate and Unna boots. We can discharge wrapping the right leg she has 20/30 stockings at home she will need to protect the scar tissue in this area 2/28; the area on the right medial malleolus remains closed the patient has a compression stocking. The area on the left is smaller. We have been using silver alginate and Unna boots. 3/6 the area on the right medial ankle remains closed. Good edema control noted she is using her own compression stocking. The area on the left medial ankle is smaller. We have been managing this with silver alginate and Unna  boots which we will continue today. 3/13;  the area on the right medial ankle remains closed and I'm declaring it healed today. When necessary the left is about the same still a healthy-looking surface but no major change and wound area. No evidence of infection and using silver alginate under unna and generally making considerable improvement 3/27 the area on the right medial ankle remains closed the area on the left is about the same as last week. Certainly not any worse we have been using silver alginate under an Unna boot 4/3; the area on the right medial ankle remains closed per the patient. We did not look at this wound. The wound on the left medial ankle is about the same surface looks healthy we have been using silver alginate under an Unna boot 4/10; area on the right medial ankle remains closed per the patient. We did not look at this wound. The wound on the left medial ankle is slightly larger. The patient complains that the [name] caused burning pain all week. She also told us that she was a lot more active this week. Changed her back to silver alginate 4/17; right medial ankle still closed per the patient. Left medial ankle is slightly larger. Using silver alginate. She did not tolerate Hydrofera Blue on this area 4/24; right medial ankle remains closed we have not look at this. The left medial ankle continues to get larger today by about a centimeter. We have been using silver alginate under Unna boots. She complains about 4 layer compression as an alternative. She has been up on her feet working on her garden 5/8; right medial ankle remains closed we did not look at this. The left medial ankle has increased in size about 100%. We have been using silver alginate under Unna boots. She noted increased pain this week and was not surprised that the wound is deteriorated 5/15; no major change in SA however much less erythema ( one week of doxy ocellulitis). 5/22-65 year old female returns at 1 week to the clinic for left  medial ankle wound for which we have been using silver alginate under 3 layer compression She was placed on DOXY at last visit - the wound is wider at this visit. She is in 3 layer compression 5/29; change to [name] last week. I had given her empiric doxycycline 2 weeks ago for a week. She is in 3 layer compression. She complains of a lot of pain and drainage on presentation today. 6/5; using Hydrofera Blue. I gave her doxycycline recently empirically for erythema and pain around the wound. Believe her cultures showed enterococcus which not would not have been well covered by doxycycline nevertheless the wound looks better and I don't feel specifically that the enterococcus needs to be covered. She has a new what looks like a wrap injury on her lateral left ankle. 6/12; she is using Hydrofera Blue. She has a new area on the left anterior lower tibial area. This was a wrap injury last week. 6/19; the patient is using Hydrofera Blue. She arrived with marked inflammation and erythema around the wound and tenderness. 12/01/18 on evaluation today patient appears to be doing a little bit better based on what I'm hearing from the standpoint of lassos evaluation to this as far as the overall appearance of the wound is concerned. Then sometime substandard she typically sees Dr. Dellia Nims. Nonetheless overall very pleased with the progress that she's made up to this point. No fevers, chills, nausea, or vomiting noted  at this time. 7/10; some improvement in the surface area. Aggressively debrided last week apparently. I went ahead with the debridement today although the patient does not tolerate this very well. We have been using Iodoflex. Still a fair amount of drainage 7/17; slightly smaller. Using Iodoflex. 7/24; no change from last week in terms of surface area. We have been using Iodoflex. Surface looks and continues to look somewhat better 7/31; surface area slightly smaller better looking surface. We  have been using Iodoflex. This is under Unna boot compression 8/7-Patient presents at 1 week with Unna boot and Iodoflex, wound appears better 8/14-Patient presents at 1 week with Iodoflex, we use the Unna boot, wound appears to be stable better.Patient is getting Botox treatment for the inversion of the foot for tendon release, Next week 8/21; we are using Iodoflex. Unna boot. The wound is stable in terms of surface area. Under illumination there is some areas of the wound that appear to be either epithelialized or perhaps this is adherent slough at this point I was not really clear. It did not wipe off and I was reluctant to debride this today. 8/28; we are using Iodoflex in an Unna boot. Seems to be making good improvement. 9/4; using Iodoflex and wound is slightly smaller. 9/18; we are using Iodoflex with topical silver nitrate when she is here. The wound continues to be smaller 10/2; patient missed her appointment last week due to GI issues. She left and Iodoflex based dressing on for 2 weeks. Wound is about the same size about the size of a dime on the left medial lower 10/9 we have been using Iodoflex on the medial left ankle wound. She has a new superficial probable wrap injury on the dorsal left ankle 10/16; we have been using Hydrofera Blue since last week. This is on the left medial ankle 10/23; we have been using Hydrofera Blue since 2 weeks ago. This is on the left medial ankle. Dimensions are better 11/6; using Hydrofera Blue. I think the wound is smaller but still not closed. Left medial ankle 11/13; we have been using Hydrofera Blue. Wound is certainly no smaller this week. Also the surface not as good. This is the remanent of a very large area on her left medial ankle. 11/20; using Sorbact since last week. Wound was about the same in terms of size although I was disappointed about the surface debris 12/11; 3-week follow-up. Patient was on vacation. Wound is measuring slightly  larger we have been using Sorbact. 12/18; wound is about the same size however surface looks better last week after debridement. We have been using Sorbact under compression 1/15 wound is probably twice the size of last time increased in length nonviable surface. We have been using Sorbact. She was running a mild fever and missed her appointment last week 1/22; the wound is come down in size but under illumination still a very adherent debris we have been Hydrofera Blue that I changed her to last week 1/29; dimensions down slightly. We have been using Hydrofera Blue 2/19 dimensions are the same however there is rims of epithelialization under illumination. Therefore more the surface area may be epithelialized 2/26; the patient's wound actually measures smaller. The wound looks healthy. We have been using Hydrofera Blue. I had some thoughts about running Apligraf then I still may do that however this looks so much better this week we will delay that for now 3/5; the wound is small but about the same as last week. We  have been using Hydrofera Blue. No debridement is required today. 3/19; the wound is about the size of a dime. Healthy looking wound even under illumination. We have been using Hydrofera Blue. No mechanical debridement is necessary 3/26; not much change from last week although still looks very healthy. We have been using Hydrofera Blue under Unna boots Patient was offered an ankle fusion by podiatry but not until the wound heals with a proceed with this. 4/9; the patient comes in today with her original wound on the medial ankle looking satisfactory however she has some uncontrolled swelling in the middle part of her leg with 2 new open areas superiorly just lateral to the tibia. I think this was probably a wrap issue. She said she felt uncomfortable during the week but did not call in. We have been using Hydrofera Blue 4/16; the wound on the medial ankle is about the same. She has  innumerable small areas superior to this across her mid tibia. I think this is probably folliculitis. She is also been working in the yard doing a lot of sweating 4/30; the patient issue on the upper areas across her mid tibia of all healed. I think this was excessive yard work if I remember. Her wound on the medial ankle is smaller. Some debris on this we have been using Hydrofera Blue under Unna boots 5/7; mid tibia. She has been using Hydrofera Blue under an Unna wrap. She is apparently going for her ankle surgery on June 3 10/28/19-Patient returns to clinic with the ankle wound, we are using Hydrofera Blue under Unna wrap, surgery is scheduled for her left foot for June 3 so she will be back for nurse visit next week READMISSION 01/17/2020 [name] is a 65 year old woman we have had in this clinic for a long period of time with severe venous hypertension and refractory wounds on her medial lower legs and ankles bilaterally. 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 When she was off her legs for a prolonged period example when she fell and suffered a compression fracture things would heal up quite nicely. She is now retired and we managed to heal up the right medial leg wound. The left one was very tiny last time I saw this although still refractory. 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 It got to the point where this was interfering with her walking and she elected to proceed with a ankle arthrodesis to straighten her her ankle and leave her with a functional outcome for mobilization. The patient was referred to Dr. Doren Custard and really this took some time to arrange. Dr. Doren Custard saw her on 12/07/2019. Once again he verified that she had no arterial issues. She had previously had an  angiogram several years ago. Follow-up ABIs on the left showed an ABI of 1.12 with triphasic waveforms and a TBI of 0.92. She is felt to have chronic deep venous insufficiency but I do not think it was felt that anything could be done from about this from an ablation point of view. At the time Dr. Doren Custard saw this patient the wounds actually look closed via the pictures in his clinic. The patient finally underwent her surgery on 12/15/2019. This went reasonably well and there was a good anatomic outcome. She developed a  small distal wound dehiscence on the lateral part of the surgical wound. However more problematically she is developed recurrence of the wound on the medial left ankle. There are actually 2 wounds here one in the distal lower leg and 1 pretty much at the level of the medial malleolus. It is a more distal area that is more problematic. She has been using Hydrofera Blue which started on Friday before this she was simply Ace wrapping. There was a culture done that showed Pseudomonas and she is on ciprofloxacin. A recent CNS on 8/11 was negative. The patient reports some pain but I generally think this is improving. She is using a cam boot completely nonweightbearing using a walker for pivot transfers and a wheelchair 8/24; not much improvement unfortunately she has a surgical wound on the lateral part in the venous insufficiency wound medially. The bottom part of the medial insufficiency wound is still necrotic there is exposed tendon here. We have been using Hydrofera Blue under compression. Her edema control is however better 8/31; patient in for follow-up of his surgical wound on the lateral part of her left leg and chronic venous insufficiency ulcers medially. We put her back in compression last week. She comes in today with a complaint of 3 or 4 days worth of increasing pain. She felt her cam walker was rubbing on the area on the back of her heel. However there is intense erythema seems  more likely she has cellulitis. She had 2 cultures done when she was seeing podiatry in the postop. One of them in late July showed Pseudomonas and she received a course of ciprofloxacin the other was negative on 8/11 she is allergic to penicillin with anaphylactoid complaints of hives oral swelling via information in epic 9/9; when I saw this patient last week she had intense anterior erythema around her wound on the right lateral heel and ankle and also into the right medial heel. Some of this was no doubt drainage and her walker boot however I was convinced she had cellulitis. I gave her Levaquin and Bactrim she is finishing up on this now. She is following up with Dr. [name] he saw her yesterday. He is taken her out of the walking boot of course she is still nonweightbearing. Her x-ray was negative for any worrisome features such as soft tissue air etc. Things are a lot better this week. She has home health. We have been using Hydrofera Blue under an [employer] which she put back on yesterday. I did not wrap her last week 9/17; her surrounding skin looks a lot better. In fact the area on the left lateral ankle has just a scant amount of eschar. The only remaining wound is the large area on the left medial ankle. Probably about 60% of this is healthy granulation at the surface however she has a significant divot distally. This has adherent debris in it. I been using debridement and silver collagen to try and get this area to fill-in although I do not think we have made much progress this week 9/24; the patient's wound on the left medial ankle looks a lot better. The deeper divot area distally still requires debridement but this is cleaning up quite nicely we have been using silver collagen. The patient is complaining of swelling in her foot and is worried that that is contributing to the nonhealing of the ankle wound. She is also complaining of numbness in her anterior toes 10/4; left medial ankle.  The small area distally  still has a divot with necrotic material that I have been debriding away. This has an undermining area. She is approved for Apligraf. She saw Dr. [name] her surgeon on 10/1. I think he declared himself is satisfied with the condition of things. Still nonweightbearing till the end of the month. 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 Her surgical wound is well closed. There is no evidence of infection 10/11; the patient arrived in clinic today with the expectation that we be able to put an Apligraf on this area after debridement however she arrives with a relatively large amount of green drainage on the dressing. The patient states that this started on Friday. She has not been systemically unwell. 10/19; culture I did last week showed both Enterococcus and Pseudomonas. I think this came in separate parts because I stopped her ciprofloxacin I gave her and prescribed her linezolid however now looking at the final culture result this is Pseudomonas which is resistant to quinolones. She has not yet picked up the linezolid apparently phone issues. We are also trying to get a topical antibiotic out of [place] in [place] they can be applied by home health. She is still having green drainage 10/16; the patient has her topical antibiotic from [name] in [place]. This is a compounded gel with vancomycin and ciprofloxacin and gentamicin. We are applying this on the wound bed with silver alginate over the top with Unna boot wraps. She arrives in clinic today with a lot less ominous looking drainage although she is only use this topical preparation once the second time today. She sees Dr. [name] her surgeon on Friday she has home health changing the dressing 11/2; still using her compounded topical antibiotic under silver alginate. Surface is cleaning up there is less drainage. We had an Apligraf for her today and I elected to apply it. A  light coating of her antibiotic 04/25/2020 upon evaluation today patient appears to be doing well with regard to her ankle ulcer. There is a little bit of slough noted on the surface of the wound I am can have to perform sharp debridement to clear this away today. With that being said other than that fact overall I feel like she is making progress and we do see some new epithelial growth. There is also some improvement in the depth of the wound and that distal portion. There is little bit of slough there as well. 12/7; 2-week follow-up. Apligraf #3. Dimensions are smaller. Closing in especially inferiorly. Still some surface debris. Still using the [facility] topical antibiotic but I told her that I don't think this needs to be renewed 12/21; 2-week follow-up. Apligraf #4 dimensions are smaller. Nice improvement 06/05/2020; 2-week follow-up. The patient's wound on the left medial ankle looks really excellent. Nice granulation. Advancing epithelialization no undermining no evidence of infection. We would have to reapply for another Apligraf but with the condition of this wound I did not feel strongly about it. We used Hydrofera Blue under the same degree of compression. She follows up with Dr. [name] her surgeon a week Friday 06/13/2020 upon evaluation today patient appears to be doing excellent in regard to her wound. She has been tolerating the dressing changes without complication. Fortunately there is no signs of active infection at this time. No fevers, chills, nausea, vomiting, or diarrhea. She was using Hydrofera Blue last week. 06/20/2020 06/20/2020 on evaluation today patient actually appears to be doing excellent in regard to her wound. This  is measuring better and looking much better as well. She has been using the collagen that seems to be doing better for her as well even though the [name] was and is not sticking or feeling as rough on her wound. She did see Dr. [name] on Friday he is very  pleased he also stated none of the hardware has shifted. That is great news 1/27; the patient has a small clean wound all that is remaining. I agree that this is too small to really consider an Apligraf. Under illumination the surface is looking quite good. We have been using collagen although the dimensions are not any better this week 3/2; the patient has a small clean wound on the left medial ankle. Although this left of her substantial original areas. Measurements are smaller. We have been using polymen Ag under an Haematologist. 3/10; small area on the left medial ankle. This looks clean nothing to debride however dimensions are about the same we have been using polymen I think now for 2 weeks Objective Constitutional Sitting or standing Blood Pressure is within target range for patient.. Pulse regular and within target range for patient.Marland Kitchen Respirations regular, non-labored and within target range.. Temperature is normal and within the target range for the patient.Marland Kitchen Appears in no distress. Vitals Time Taken: 12:56 PM, Height: 68 in, Temperature: 98.1 F, Pulse: 56 bpm, Respiratory Rate: 17 breaths/min, Blood Pressure: 127/67 mmHg. Cardiovascular Pedal pulses are palpable. General Notes: Wound exam; no need for debridement. There was nothing on the surface even with gentle probing with a curette. There is no evidence of surrounding infection we have good edema control Integumentary (Hair, Skin) Wound #15 status is Open. Original cause of wound was Gradually Appeared. The wound is located on the Left,Medial Malleolus. The wound measures 1.3cm length x 0.7cm width x 0.1cm depth; 0.715cm^2 area and 0.071cm^3 volume. There is Fat Layer (Subcutaneous Tissue) exposed. There is no tunneling or undermining noted. There is a medium amount of serosanguineous drainage noted. The wound margin is flat and intact. There is large (67-100%) red granulation within the wound bed. There is no necrotic tissue within  the wound bed. Assessment Active Problems ICD-10 Chronic venous hypertension (idiopathic) with ulcer and inflammation of left lower extremity Non-pressure chronic ulcer of other part of left lower leg with other specified severity Non-pressure chronic ulcer of left ankle with other specified severity Procedures Wound #15 Pre-procedure diagnosis of Wound #15 is a Venous Leg Ulcer located on the Left,Medial Malleolus . There was a Haematologist Compression Therapy Procedure by Baruch Gouty, RN. Post procedure Diagnosis Wound #15: Same as Pre-Procedure Plan Follow-up Appointments: Return Appointment in 1 week. Bathing/ Shower/ Hygiene: May shower with protection but do not get wound dressing(s) wet. Edema Control - Lymphedema / SCD / Other: Elevate legs to the level of the heart or above for 30 minutes daily and/or when sitting, a frequency of: Avoid standing for long periods of time. Exercise regularly Compression stocking or Garment 20-30 mm/Hg pressure to: - right leg daily WOUND #15: - Malleolus Wound Laterality: Left, Medial Cleanser: Soap and Water 1 x Per Week/15 Days Discharge Instructions: May shower and wash wound with dial antibacterial soap and water prior to dressing change. Peri-Wound Care: Sween Lotion (Moisturizing lotion) 1 x Per Week/15 Days Discharge Instructions: Apply moisturizing lotion as directed Peri-Wound Care: Triamcinolone 15 ([name]) 1 x Per Week/15 Days Discharge Instructions: Use triamcinolone 15 ([name]) as directed Prim Dressing: PolyMem Silver Non-Adhesive Dressing, 4.25x4.25 in 1 x  Per Week/15 Days ary Discharge Instructions: Apply to wound bed as instructed Secondary Dressing: Woven Gauze Sponge, Non-Sterile 4x4 in (Home Health) 1 x Per Week/15 Days Discharge Instructions: Apply over primary dressing as directed. Secondary Dressing: ABD Pad, 5x9 (Home Health) 1 x Per Week/15 Days Discharge Instructions: Apply over primary dressing as directed. Com pression  Wrap: Unnaboot w/Calamine, 4x10 (in/yd) (Home Health) 1 x Per Week/15 Days Discharge Instructions: Apply Unnaboot as directed. 1. I am going to continue with polymen for another week. If the wound is unchanged that we may need to change back to collagen perhaps endoform. She is not in need of a debridement. Even with probing the surface there is seem fine Electronic Signature(s) Signed: 07/12/2020 5:13:40 PM By: Linton Ham MD Entered By: Linton Ham on 07/12/2020 13:39:07 -------------------------------------------------------------------------------- SuperBill Details Patient Name: Date of Service: [name], [name] 07/12/2020 Medical Record Number: [id number] Patient Account Number: [id number] Date of Birth/Sex: Treating RN: [id number] (65 y.o. [name], [name] Primary Care Provider: Lennie [name] Other Clinician: Referring Provider: Treating Provider/Extender: [name] in Treatment: 25 Diagnosis Coding ICD-10 Codes Code Description [phone] Chronic venous hypertension (idiopathic) with ulcer and inflammation of left lower extremity L97.828 Non-pressure chronic ulcer of other part of left lower leg with other specified severity L97.328 Non-pressure chronic ulcer of left ankle with other specified severity Facility Procedures CPT4 Code: [id number] Description: (Facility Use Only) [id number] - APPLY UNNA BOOT LT Modifier: Quantity: 1 Physician Procedures : CPT4 Code Description Modifier [id number] - WC PHYS LEVEL 3 - EST PT ICD-10 Diagnosis Description I87.332 Chronic venous hypertension (idiopathic) with ulcer and inflammation of left lower extremity L97.828 Non-pressure chronic ulcer of other part  of left lower leg with other specified severity L97.328 Non-pressure chronic ulcer of left ankle with other specified severity Quantity: 1 Electronic Signature(s) Signed: 07/12/2020 5:13:40 PM By: Linton Ham MD Entered By: Linton Ham on  07/12/2020 13:39:36

## 2020-07-13 NOTE — Progress Notes (Signed)
PABLO, MATHURIN ([id number]) Visit Report for 07/12/2020 Arrival Information Details Patient Name: Date of Service: [facility], [name] 07/12/2020 12:30 PM Medical Record Number: [id number] Patient Account Number: [id number] Date of Birth/Sex: Treating RN: [birth year] (65 y.o. [name], [name] Primary Care Kenslei Hearty: [name] Other Clinician: Referring Jayvion Stefanski: Treating Ahsley Attwood/Extender: [name] in Treatment: 25 Visit Information History Since Last Visit Added or deleted any medications: No Patient Arrived: Ambulatory Any new allergies or adverse reactions: No Arrival Time: 12:51 Had a fall or experienced change in No Accompanied By: self activities of daily living that may affect Transfer Assistance: None risk of falls: Patient Identification Verified: Yes Signs or symptoms of abuse/neglect since last visito No Secondary Verification Process Completed: Yes Hospitalized since last visit: No Patient Requires Transmission-Based Precautions: No Implantable device outside of the clinic excluding No Patient Has Alerts: Yes cellular tissue based products placed in the center Patient Alerts: L ABI =1.12, TBI = .92 since last visit: R ABI= 1.02, TBI= .58 Has Dressing in Place as Prescribed: Yes Pain Present Now: No Electronic Signature(s) Signed: 07/13/2020 1:25:35 PM By: [name] Entered By: [name] on 07/12/2020 12:52:26 -------------------------------------------------------------------------------- Compression Therapy Details Patient Name: Date of Service: [facility] [name], [name] 07/12/2020 12:30 PM Medical Record Number: [id number] Patient Account Number: [id number] Date of Birth/Sex: Treating RN: Jul 31, 1955 (65 y.o. [name] Primary Care Guiliana Shor: [name] Other Clinician: Referring Bula Cavalieri: Treating Alicen Donalson/Extender: [name] in Treatment: 25 Compression Therapy Performed for Wound  Assessment: Wound #15 Left,Medial Malleolus Performed By: Clinician Baruch Gouty, RN Compression Type: Rolena Infante Post Procedure Diagnosis Same as Pre-procedure Electronic Signature(s) Signed: 07/12/2020 6:08:01 PM By: Deon Pilling Entered By: Deon Pilling on 07/12/2020 13:29:33 -------------------------------------------------------------------------------- Encounter Discharge Information Details Patient Name: Date of Service: [facility] [name], [name] 07/12/2020 12:30 PM Medical Record Number: [id number] Patient Account Number: [id number] Date of Birth/Sex: Treating RN: 1956-01-28 (65 y.o. [name] Asmi Fugere: [name] Other Clinician: Referring Alison Breeding: Treating Rayn Enderson/Extender: [name] in Treatment: 25 Encounter Discharge Information Items Discharge Condition: Stable Ambulatory Status: Ambulatory Discharge Destination: Home Transportation: Private Auto Accompanied By: self Schedule Follow-up Appointment: Yes Clinical Summary of Care: Patient Declined Electronic Signature(s) Signed: 07/12/2020 5:55:42 PM By: Baruch Gouty RN, BSN Entered By: Baruch Gouty on 07/12/2020 13:49:35 -------------------------------------------------------------------------------- Lower Extremity Assessment Details Patient Name: Date of Service: [facility] [name], [name] 07/12/2020 12:30 PM Medical Record Number: [id number] Patient Account Number: [id number] Date of Birth/Sex: Treating RN: 1955/07/19 (65 y.o. [name] Laniyah Rosenwald: [name] Other Clinician: Referring Delois Silvester: Treating Ebon Ketchum/Extender: [name] in Treatment: 25 Edema Assessment Assessed: [name]: No] [Right: No] Edema: [Left: N] [Right: o] Calf Left: Right: Point of Measurement: From Medial Instep 28 cm Ankle Left: Right: Point of Measurement: From Medial Instep 21.5 cm Vascular Assessment Pulses: Dorsalis  Pedis Palpable: [Left:Yes] Electronic Signature(s) Signed: 07/12/2020 5:55:42 PM By: Baruch Gouty RN, BSN Entered By: Baruch Gouty on 07/12/2020 13:15:57 -------------------------------------------------------------------------------- Multi Wound Chart Details Patient Name: Date of Service: [name], [name] 07/12/2020 12:30 PM Medical Record Number: [id number] Patient Account Number: [id number] Date of Birth/Sex: Treating RN: [birth year] (65 y.o. [name] Primary Care Elizardo Chilson: [name] Other Clinician: Referring Jillann Charette: Treating Addysin Porco/Extender: [name] in Treatment: 25 Vital Signs Height(in): 68 Pulse(bpm): 56 Weight(lbs): Blood Pressure(mmHg): 127/67 Body Mass Index(BMI): Temperature(F): 98.1 Respiratory Rate(breaths/min): 17 Photos: [15:No  Photos Left, Medial Malleolus] [N/A:N/A N/A] Wound Location: [15:Gradually Appeared] [N/A:N/A] Wounding Event: [15:Venous Leg Ulcer] [N/A:N/A] Primary Etiology: [15:Peripheral Venous Disease,] [N/A:N/A] Comorbid History: [15:Osteoarthritis, Confinement Anxiety 12/30/2019] [N/A:N/A] Date Acquired: [15:25] [N/A:N/A] Weeks of Treatment: [15:Open] [N/A:N/A] Wound Status: [15:1.3x0.7x0.1] [N/A:N/A] Measurements L x W x D (cm) [15:0.715] [N/A:N/A] A (cm) : rea [15:0.071] [N/A:N/A] Volume (cm) : [15:93.90%] [N/A:N/A] % Reduction in Area: [15:99.20%] [N/A:N/A] % Reduction in Volume: [15:Full Thickness With Exposed Support N/A] Classification: [15:Structures Medium] [N/A:N/A] Exudate Amount: [15:Serosanguineous] [N/A:N/A] Exudate Type: [15:red, brown] [N/A:N/A] Exudate Color: [15:Flat and Intact] [N/A:N/A] Wound Margin: [15:Large (67-100%)] [N/A:N/A] Granulation Amount: [15:Red] [N/A:N/A] Granulation Quality: [15:None Present (0%)] [N/A:N/A] Necrotic Amount: [15:Fat Layer (Subcutaneous Tissue): Yes N/A] Exposed Structures: [15:Fascia: No Tendon: No Muscle: No Joint: No Bone: No  Small (1-33%)] [N/A:N/A] Epithelialization: [15:Compression Therapy] [N/A:N/A] Treatment Notes Electronic Signature(s) Signed: 07/12/2020 5:13:40 PM By: Linton Ham MD Signed: 07/12/2020 6:08:01 PM By: Deon Pilling Entered By: Linton Ham on 07/12/2020 13:35:00 -------------------------------------------------------------------------------- Multi-Disciplinary Care Plan Details Patient Name: Date of Service: [facility] [name], [name] 07/12/2020 12:30 PM Medical Record Number: [id number] Patient Account Number: [id number] Date of Birth/Sex: Treating RN: 17-Jan-1956 (65 y.o. [name] Primary Care Ayeshia Coppin: [name] Other Clinician: Referring Bresha Hosack: Treating Reynalda Canny/Extender: [name] in Treatment: 25 Active Inactive Venous Leg Ulcer Nursing Diagnoses: Actual venous Insuffiency (use after diagnosis is confirmed) Knowledge deficit related to disease process and management Goals: Patient will maintain optimal edema control Date Initiated: 06/20/2020 Target Resolution Date: 08/03/2020 Goal Status: Active Interventions: Assess peripheral edema status every visit. Compression as ordered Treatment Activities: Therapeutic compression applied : 06/20/2020 Notes: Wound/Skin Impairment Nursing Diagnoses: Impaired tissue integrity Knowledge deficit related to ulceration/compromised skin integrity Goals: Patient/caregiver will verbalize understanding of skin care regimen Date Initiated: 01/17/2020 Target Resolution Date: 08/03/2020 Goal Status: Active Ulcer/skin breakdown will have a volume reduction of 30% by week 4 Date Initiated: 01/17/2020 Date Inactivated: 03/12/2020 Target Resolution Date: 03/09/2020 Goal Status: Met Interventions: Assess patient/caregiver ability to obtain necessary supplies Assess patient/caregiver ability to perform ulcer/skin care regimen upon admission and as needed Assess ulceration(s) every visit Provide education  on ulcer and skin care Notes: Electronic Signature(s) Signed: 07/12/2020 6:08:01 PM By: Deon Pilling Entered By: Deon Pilling on 07/12/2020 13:26:46 -------------------------------------------------------------------------------- Pain Assessment Details Patient Name: Date of Service: [facility] [name], [name] 07/12/2020 12:30 PM Medical Record Number: [id number] Patient Account Number: [id number] Date of Birth/Sex: Treating RN: 1956/01/19 (65 y.o. [name] Primary Care Jayzon Taras: [name] Other Clinician: Referring Henry Demeritt: Treating Laster Appling/Extender: [name] in Treatment: 25 Active Problems Location of Pain Severity and Description of Pain Patient Has Paino Yes Site Locations Pain Location: Pain Location: Generalized Pain, Pain in Ulcers With Dressing Change: Yes Duration of the Pain. Constant / Intermittento Constant Rate the pain. Current Pain Level: 4 Least Pain Level: 2 Character of Pain Describe the Pain: Aching, Other: stinging Pain Management and Medication Current Pain Management: Medication: Yes Is the Current Pain Management Adequate: Adequate Rest: Yes How does your wound impact your activities of daily livingo Sleep: No Bathing: No Appetite: No Relationship With Others: No Bladder Continence: No Emotions: No Bowel Continence: No Drive: No Toileting: No Hobbies: No Dressing: No Electronic Signature(s) Signed: 07/12/2020 5:55:42 PM By: Baruch Gouty RN, BSN Signed: 07/12/2020 6:08:01 PM By: Deon Pilling Entered By: Baruch Gouty on 07/12/2020 13:15:12 -------------------------------------------------------------------------------- Patient/Caregiver Education Details Patient Name: Date of Service: [name], ELEA NO [place] 2/10/2022andnbsp12:30 PM Medical  Record Number: [id number] Patient Account Number: [id number] Date of Birth/Gender: Treating RN: 01/28/1956 (65 y.o. [name] Primary Care Physician:  [name] Other Clinician: Referring Physician: Treating Physician/Extender: [name] in Treatment: 25 Education Assessment Education Provided To: Patient Education Topics Provided Wound/Skin Impairment: Handouts: Skin Care Do's and Dont's Methods: Explain/Verbal Responses: Reinforcements needed Electronic Signature(s) Signed: 07/12/2020 6:08:01 PM By: Deon Pilling Entered By: Deon Pilling on 07/12/2020 13:27:00 -------------------------------------------------------------------------------- Wound Assessment Details Patient Name: Date of Service: [facility] [name], [name] 07/12/2020 12:30 PM Medical Record Number: [id number] Patient Account Number: [id number] Date of Birth/Sex: Treating RN: 02/20/1956 (65 y.o. [name], [name] Primary Care Louine Tenpenny: [name] Other Clinician: Referring Kaniel Kiang: Treating Janmichael Giraud/Extender: [name] in Treatment: 25 Wound Status Wound Number: 15 Primary Venous Leg Ulcer Etiology: Wound Location: Left, Medial Malleolus Wound Status: Open Wounding Event: Gradually Appeared Comorbid Peripheral Venous Disease, Osteoarthritis, Confinement Date Acquired: 12/30/2019 History: Anxiety Weeks Of Treatment: 25 Clustered Wound: No Wound Measurements Length: (cm) 1.3 Width: (cm) 0.7 Depth: (cm) 0.1 Area: (cm) 0.715 Volume: (cm) 0.071 % Reduction in Area: 93.9% % Reduction in Volume: 99.2% Epithelialization: Small (1-33%) Tunneling: No Undermining: No Wound Description Classification: Full Thickness With Exposed Support Structures Wound Margin: Flat and Intact Exudate Amount: Medium Exudate Type: Serosanguineous Exudate Color: red, brown Foul Odor After Cleansing: No Slough/Fibrino No Wound Bed Granulation Amount: Large (67-100%) Exposed Structure Granulation Quality: Red Fascia Exposed: No Necrotic Amount: None Present (0%) Fat Layer (Subcutaneous Tissue) Exposed:  Yes Tendon Exposed: No Muscle Exposed: No Joint Exposed: No Bone Exposed: No Treatment Notes Wound #15 (Malleolus) Wound Laterality: Left, Medial Cleanser Soap and Water Discharge Instruction: May shower and wash wound with dial antibacterial soap and water prior to dressing change. Peri-Wound Care Sween Lotion (Moisturizing lotion) Discharge Instruction: Apply moisturizing lotion as directed Triamcinolone 15 (g) Discharge Instruction: Use triamcinolone 15 (g) as directed Topical Primary Dressing PolyMem Silver Non-Adhesive Dressing, 4.25x4.25 in Discharge Instruction: Apply to wound bed as instructed Secondary Dressing Woven Gauze Sponge, Non-Sterile 4x4 in Discharge Instruction: Apply over primary dressing as directed. ABD Pad, 5x9 Discharge Instruction: Apply over primary dressing as directed. Secured With Compression Wrap Unnaboot w/Calamine, 4x10 (in/yd) Discharge Instruction: Apply Unnaboot as directed. Compression Stockings Add-Ons Electronic Signature(s) Signed: 07/12/2020 5:55:42 PM By: Baruch Gouty RN, BSN Signed: 07/12/2020 6:08:01 PM By: Deon Pilling Entered By: Baruch Gouty on 07/12/2020 13:18:15 -------------------------------------------------------------------------------- Vitals Details Patient Name: Date of Service: [facility] [name], [name] 07/12/2020 12:30 PM Medical Record Number: [id number] Patient Account Number: [id number] Date of Birth/Sex: Treating RN: Apr 09, 1956 (65 y.o. [name], Meta.Reding Primary Care Areg Bialas: [name] Other Clinician: Referring Arlenis Blaydes: Treating Jimia Gentles/Extender: [name] in Treatment: 25 Vital Signs Time Taken: 12:56 Temperature (F): 98.1 Height (in): 68 Pulse (bpm): 56 Respiratory Rate (breaths/min): 17 Blood Pressure (mmHg): 127/67 Reference Range: 80 - 120 mg / dl Electronic Signature(s) Signed: 07/13/2020 1:25:35 PM By: [name] Entered By: [name] on  07/12/2020 12:56:33

## 2020-07-17 ENCOUNTER — Telehealth: Payer: Self-pay

## 2020-07-17 NOTE — Telephone Encounter (Signed)
Patient called and was wanting recommendation on what shoes are good to fit over her fusion.  Please advise

## 2020-07-19 ENCOUNTER — Other Ambulatory Visit: Payer: Self-pay

## 2020-07-19 ENCOUNTER — Encounter (HOSPITAL_BASED_OUTPATIENT_CLINIC_OR_DEPARTMENT_OTHER): Payer: Medicare Other | Admitting: Internal Medicine

## 2020-07-19 DIAGNOSIS — I87332 Chronic venous hypertension (idiopathic) with ulcer and inflammation of left lower extremity: Secondary | ICD-10-CM | POA: Diagnosis not present

## 2020-07-19 NOTE — Progress Notes (Signed)
[name], [name] ([id number]) Visit Report for 07/19/2020 Debridement Details Patient Name: Date of Service: [facility], [name] 07/19/2020 10:30 A M Medical Record Number: [id number] Patient Account Number: [id number] Date of Birth/Sex: Treating RN: [birth year] (65 y.o. [name], [name] Primary Care Provider: [name] Other Clinician: Referring Provider: Treating Provider/Extender: [name] in Treatment: 26 Debridement Performed for Assessment: Wound #15 Left,Medial Malleolus Performed By: Physician [name]., MD Debridement Type: Debridement Severity of Tissue Pre Debridement: Fat layer exposed Level of Consciousness (Pre-procedure): Awake and Alert Pre-procedure Verification/Time Out Yes - 11:10 Taken: Start Time: 11:11 Pain Control: Lidocaine 4% T opical Solution T Area Debrided (L x W): otal 1.7 (cm) x 0.3 (cm) = 0.51 (cm) Tissue and other material debrided: Viable, Non-Viable, Slough, Skin: Dermis , Fibrin/Exudate, Slough Level: Skin/Dermis Debridement Description: Selective/Open Wound Instrument: Curette Bleeding: Minimum Hemostasis Achieved: Pressure End Time: 11:13 Procedural Pain: 0 Post Procedural Pain: 0 Response to Treatment: Procedure was tolerated well Level of Consciousness (Post- Awake and Alert procedure): Post Debridement Measurements of Total Wound Length: (cm) 1.7 Width: (cm) 0.3 Depth: (cm) 0.1 Volume: (cm) 0.04 Character of Wound/Ulcer Post Debridement: Improved Severity of Tissue Post Debridement: Fat layer exposed Post Procedure Diagnosis Same as Pre-procedure Electronic Signature(s) Signed: 07/19/2020 5:29:55 PM By: Linton Ham MD Signed: 07/19/2020 5:51:10 PM By: Deon Pilling Entered By: Linton Ham on 07/19/2020 11:21:28 -------------------------------------------------------------------------------- HPI Details Patient Name: Date of Service: [name], [name] 07/19/2020 10:30 A  M Medical Record Number: [id number] Patient Account Number: [id number] Date of Birth/Sex: Treating RN: [birth year] (65 y.o. [name] Primary Care Provider: [name] Other Clinician: Referring Provider: Treating Provider/Extender: [name] in Treatment: 26 History of Present Illness HPI Description: the remaining wound is over the left medial ankle. Similar wound over the right medial ankle healed largely with use of Apligraf. Most recently we have been using Hydrofera Blue over this wound with considerable improvement. The patient has been extensively worked up in the past for her venous insufficiency and she is not a candidate for antireflux surgery although I have none of the details available currently. 08/24/14; considerable improvement today. About 50% of this wound areas now epithelialized. The base of the wound appears to be healthier granulation.as opposed to last week when she had deteriorated a considerable improvement 08/17/14; unfortunately the wound has regressed somewhat. The areas of epithelialization from the superior aspect are not nearly as healthy as they were last week. The patient thinks her Hydrofera Blue slipped. 09/07/14; unfortunately the area has markedly regressed in the 2 weeks since I've seen this. There is an odor surrounding erythema. The healthy granulation tissue that we had at the base of the wound now is a dusky color. The nurse reports green drainage 09/14/14; the area looks somewhat better than last week. There is less erythema and less drainage. The culture I did did not show any growth. Nevertheless I think it is better to continue the Cipro and doxycycline for a further week. The remaining wound area was debridement. 09/21/14. Wound did not require debridement last week. Still less erythema and less drainage. She can complete her antibiotics. The areas of epithelialization in the superior aspect of the wound do not look as  healthy as they did some weeks ago 10/05/14 continued improvement in the condition of this wound. There is advancing epithelialization. Less aggressive debridement required 10/19/14 continued improvement in the condition and volume of this wound. Less aggressive  debridement to the inferior part of this to remove surface slough and fibrinous eschar 11/02/14 no debridement is required. The surface granulation appears healthy although some of her islands of epithelialization seem to have regressed. No evidence of infection 11/16/14; lites surface debridement done of surface eschar. The wound does not look to be unhealthy. No evidence of infection. Unfortunately the patient has had podiatry issues in the right foot and for some reason has redeveloped small surface ulcerations in the medial right ankle. Her original presentation involved wounds in this area 11/23/14 no debridement. The area on the right ankle has enlarged. The left ankle wound appears stable in terms of the surface although there is periwound inflammation. There has been regression in the amount of new skin 11/30/14 no debridement. Both wound areas appear healthy. There was no evidence of infection. The the new area on the right medial ankle has enlarged although that both the surfaces appear to be stable. 12/07/14; Debridement of the right medial ankle wound. No no debridement was done on the left. 12/14/14 no major change in and now bilateral medial ankle wounds. Both of these are very painful but the no overt evidence of infection. She has had previous venous ablation 12/21/14; patient states that her right medial ankle wound is considerably more painful last week than usual. Her left is also somewhat painful. She could not tolerate debridement. The right medial ankle wound has fibrinous surface eschar 12/28/14 this is a patient with severe bilateral venous insufficiency ulcers. For a considerable period of time we actually had the one on the  right medial ankle healed however this recently opened up again in June. The left medial ankle wound has been a refractory area with some absent flows. We had some success with Hydrofera Blue on this area and it literally closed by 50% however it is recently opened up Foley. Both of these were debridement today of surface eschar. She tolerates this poorly 01/25/15: No change in the status of this. Thick adherent escar. Very poor tolerance of any attempt at debridement. I had healed the right medial malleolus wound for a considerable amount of time and had the left one down to about 50% of the volume although this is totally regressed over the last 48 weeks. Further the right leg has reopened. she is trying to make a appointment with pain and vascular, previous ablations with Dr. Aleda Grana. I do not believe there is an arterial insufficiency issue here 02/01/15 the status of the adherent eschar bilaterally is actually improved. No debridement was done. She did not manage to get vascular studies done 02/08/15 continued debridement of the area was done today. The slough is less adherent and comes off with less pressure. There is no surrounding infection peripheral pulses are intact 02/15/15 selective debridement with a disposable curette. Again the slough is less adherent and comes off with less difficulty. No surrounding infection peripheral pulses are intact. 02/22/15 selective debridement of the right medial ankle wound. Slough comes off with less difficulty. No obvious surrounding infection peripheral pulses are intact I did not debridement the one on the left. Both of these are stable to improved 03/01/15 selective debridement of both wound areas using a curette to. Adherent slough cup soft with less difficulty. No obvious surrounding infection. The patient tells me that 2 days ago she noted a rash above the right leg wrap. She did not have this on her lower legs when she change this over she arrives  with widespread left greater than  right almost folliculitis-looking rash which is extremely pruritic. I don't see anything to culture here. There is no rash on the rest of her body. She feels well systemically. 03/08/15; selective debridement of both wounds using a curette. Base of this does not look unhealthy. She had limegreen drainage coming out of the left leg wound and describes a lot of drainage. The rash on her left leg looks improved to. No cultures were done. 03/22/15; patient was not here last week. Basal wounds does not look healthy and there is no surrounding erythema. No drainage. There is still a rash on the left leg that almost looks vasculitic however it is clearly limited to the top of where the wrap would be. 04/05/15; on the right required a surgical debridement of surface eschar and necrotic subcutaneous tissue. I did not debridement the area on the left. These continue to be large open wounds that are not changing that much. We were successful at one point in healing the area on the right, and at the same time the area on the left was roughly half the size of current measurements. I think a lot of the deterioration has to do with the prolonged time the patient is on her feet at work 04/19/15 I attempted-like surface debridement bilaterally she does not tolerate this. She tells me that she was in allergic care yesterday with extreme pain over her left lateral malleolus/ankle and was told that she has an "sprain" 05/03/15; large bilateral venous insufficiency wounds over the medial malleolus/medial aspect of her ankles. She complains of copious amounts of drainage and his usual large amounts of pain. There is some increasing erythema around the wound on the right extending into the medial aspect of her foot to. historically she came in with these wounds the right one healed and the left one came down to roughly half its current size however the right one is reopened and the left  is expanded. 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 05/10/15 large bilateral venous insufficiency wounds. There is less adherence surface left however the surface culture that I did last week grew pseudomonas therefore bilateral selective debridement score necessary. There is surrounding erythema. The patient describes severe bilateral drainage and a lot of pain in the left ankle. Apparently her podiatrist was were ready to do a cortisone shot 05/17/15; the patient complains of pain and again copious amounts of drainage. 05/24/15; we used Iodo flex last week. Patient notes considerable improvement in wound drainage. Only needed to change this once. 05/31/15; we continued Iodoflex; the base of these large wounds bilaterally is not too bad but there is probably likely a significant bioburden here. I would like to debridement just doesn't tolerate it. 06/06/14 I would like to continue the Iodoflex although she still hasn't managed to obtain supplies. She has bilateral medial malleoli or large wounds which are mostly superficial. Both of them are covered circumferentially with some nonviable fibrinous slough although she tolerates debridement very poorly. She apparently has an appointment for an ablation on the right leg by interventional radiology. 06/14/15; the patient arrives with the wounds and static condition. We attempted a debridement although she does not do well with this secondary to pain. I 07/05/15; wounds are not much smaller however there appears to be a cleaner granulating base. The left has tight fibrinous slough greater than the right. Debridement is tolerated poorly due to pain. Iodoflex is done more  for these wounds in any of the multitude of different dressings I have tried on the left 1 and then subsequently the right. 07/12/15; no change in the condition of this wound. I am able to do an aggressive debridement on the right but not the  left. She simply cannot tolerate it. We have been using Iodoflex which helps somewhat. It is worthwhile remembering that at one point we healed the right medial ankle wound and the left was about 25% of the current circumference. We have suggested returning to vascular surgery for review of possible further ablations for one reason or another she has not been able to do this. 07/26/15 no major change in the condition of either wound on her medial ankle. I did not attempt to debridement of these. She has been aggressively scrubbing these while she is in the shower at home. She has her supply of Iodoflex which seems to have done more for these wounds then anything I have put on recently. 08/09/15 wound area appears larger although not verified by measurements. Using Iodoflex 09/05/2015 -- she was here for avisit today but had significant problems with the wound and I was asked to see her for a physician opinion. I have summarize that this lady has had surgery on her left lower extremity about 10 years ago where the possible veins stripping was done. She has had an opinion from interventional radiology around November 2016 where no further sclerotherapy was ordered. The patient works 12 hours a day and stands on a concrete floor with work boots and is unable to get the proper compression she requires and cannot elevate her limbs appropriately at any given time. She has recently grown Pseudomonas from her wound culture but has not started her ciprofloxacin which was called in for her. 09/13/15 this continues to be a difficult situation for this patient. At one point I had this wound down to a 1.5 x 1.5" wound on her left leg. This is deteriorated and the right leg has reopened. She now has substantial wounds on her medial calcaneus, malleoli and into her lower leg. One on the left has surface eschar but these are far too painful for me to debridement here. She has a vascular surgery appointment next week to  see if anything can be done to help here. I think she has had previous ablations several years ago at [facility]. She has no major edema. She tells me that she did not get product last time [facility]] and went for several days without it. She continues to work in work boots 12 hours a day. She cannot get compression/4-layer under her work boots. 09/20/15 no major change. Periwound edema control was not very good. Her point with pain and vascular is next Wednesday the 25th 09/28/15; the patient is seen vascular surgery and is apparently scheduled for repeat duplex ultrasounds of her bilateral lower legs next week. 10/05/15; the patient was seen by Dr. Doren Custard of vascular surgery. He feels that she should have arterial insufficiency excluded as cause/contributed to her nonhealing stage she is therefore booked for an arteriogram. She has apparently monophasic signals in the dorsalis pedis pulses. She also of course has known severe chronic venous insufficiency with previous procedures as noted previously. I had another long discussion with the patient today about her continuing to work 12 hour shifts. I've written her out for 2 months area had concerns about this as her work location is currently undergoing significant turmoil and this may lead to  her termination. She is aware of this however I agree with her that she simply cannot continue to stand for 12 hours multiple days a week with the substantial wound areas she has. 10/19/15; the Dr. Doren Custard appointment was largely for an arteriogram which was normal. She does not have an arterial issue. He didn't make a comment about her chronic venous insufficiency for which she has had previous ablations. Presumably it was not felt that anything additional could be done. The patient is now out of work as I prescribed 2 weeks ago. Her wounds look somewhat less aggravated presumably because of this. I felt I would give debridement another try today 10/25/15; no major  change in this patient's wounds. We are struggling to get her product that she can afford into her own home through her insurance. 11/01/15; no major change in the patient's wounds. I have been using silver alginate as the most affordable product. I spoke to Dr. Marla Roe last week with her requested take her to the OR for surgical debridement and placement of ACEL. Dr. Marla Roe told me that she would be willing to do this however [facility] will not cover this, fortunately the patient has [place] healthcare of some variant 11/08/15; no major change in the patient's wounds. She has been completely nonviable surface that this but is in too much pain with any attempted debridement are clinic. I have arranged for her to see Dr. Marla Roe ham of plastic surgery and this appointment is on Monday. I am hopeful that they will take her to the OR for debridement, possible ACEL ultimately possible skin graft 11/22/15 no major change in the patient's wounds over her bilateral medial calcaneus medial malleolus into the lower legs. Surface on these does not look too bad however on the left there is surrounding erythema and tenderness. This may be cellulitis or could him sleepy tinea. 11/29/15; no major changes in the patient's wounds over her bilateral medial malleolus. There is no infection here and I don't think any additional antibiotics are necessary. There is now plan to move forward. She sees Dr. Marla Roe in a week's time for preparation for operative debridement and ACEL placement I believe on 7/12. She then has a follow-up appointment with Dr. Marla Roe on 7/21 12/28/15; the patient returns today having been taken to the [facility] [place] by Dr. Marla Roe 12/12/15 she underwent debridement, intraoperative cultures [which were negative]. She had placement of a wound VAC. Parent really ACEL was not available to be placed. The wound VAC foam apparently adhered to the wound since then she's been using silver  alginate, Xeroform under Ace wraps. She still says there is a lot of drainage and a lot of pain 01/31/16; this is a patient I see monthly. I had referred her to Dr. Marla Roe him of plastic surgery for large wounds on her bilateral medial ankles. She has been to the OR twice once in early July and once in early August. She tells me over the last 3 weeks she has been using the wound VAC with ACEL underneath it. On the right we've simply been using silver alginate. Under Kerlix Coban wraps. 02/28/16; this is a patient I'm currently seeing monthly. She is gone on to have a skin graft over her large venous insufficiency ulcer on the left medial ankle. This was done by Dr. Marla Roe him. The patient is a bit perturbed about why she didn't have one on her right medial ankle wound. She has been using silver alginate to this. 03/06/16;  I received a phone call from her plastic surgery Dr. Marla Roe. She expressed some concern about the viability of the skin graft she did on the left medial ankle wound. Asked me to place Endoform on this. She told me she is not planning to do a subsequent skin graft on the right as the left one did not take very well. I had placed Hydrofera Blue on the right 03/13/16; continue to have a reasonably healthy wound on the right medial ankle. Down to 3 mm in terms of size. There is epithelialization here. The area on the left medial ankle is her skin graft site. I suppose the last week this looks somewhat better. She has an open area inferiorly however in the center there appears to be some viable tissue. There is a lot of surface callus and eschar that will eventually need to come off however none of this looked to be infected. Patient states that the is able to keep the dressing on for several days which is an improvement. 03/20/16 no major change in the circumference of either wound however on the left side the patient was at Dr. Eusebio Friendly office and they did a debridement of  left wound. 50% of the wound seems to be epithelialized. I been using Endoform on the left Hydrofera Blue in the right 03/27/16; she arrives today with her wound is not looking as healthy as they did last week. The area on the right clearly has an adherent surface to this a very similar surface on the left. Unfortunately for this patient this is all too familiar problem. Clearly the Endoform is not working and will need to change that today that has some potential to help this surface. She does not tolerate debridement in this clinic very well. She is changing the dressing wants 04/03/16; patient arrives with the wounds looking somewhat better especially on the right. Dr. Migdalia Dk change the dressing to silver alginate when she saw her on Monday and also sold her some compression socks. The usefulness of the latter is really not clear and woman with severely draining wounds. 04/10/16; the patient is doing a bit of an experiment wearing the compression stockings that Dr. Migdalia Dk provided her to her left leg and the out of legs based dressings that we provided to the right. 05/01/16; the patient is continuing to wear compression stockings Dr. Migdalia Dk provided her on the left that are apparently silver impregnated. She has been using Iodoflex to the right leg wound. Still a moderate amount of drainage, when she leaves here the wraps only last for 4 days. She has to change the stocking on the left leg every night 05/15/16; she is now using compression stockings bilaterally provided by Dr. Marla Roe. She is wearing a nonadherent layer over the wounds so really I don't think there is anything specific being done to this now. She has some reduction on the left wound. The right is stable. I think all healing here is being done without a specific dressing 06/09/16; patient arrives here today with not much change in the wound certainly in diameter to large circular wounds over the medial aspect of her  ankle bilaterally. Under the light of these services are certainly not viable for healing. There is no evidence of surrounding infection. She is wearing compression stockings with some sort of silver impregnation as prescribed by Dr. Marla Roe. She has a follow-up with her tomorrow. 06/30/16; no major change in the size or condition of her wounds. These are still probably covered  with a nonviable surface. She is using only her purchase stockings. She did see Dr. Marla Roe who seemed to want to apply Dakin's solution to this I'm not extreme short what value this would be. I would suggest Iodoflex which she still has at home. 07/28/16; I follow [name] episodically along with Dr. Marla Roe. 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 She has been applying a topical collagen ointment to both wounds with Adaptic. I don't think Dr. Marla Roe is planning to take her back to the OR. 08/19/16; I follow [name] on a monthly basis along with Dr. Marla Roe of plastic surgery. 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 I been following her for a number of years. At one point I was able to get the right medial malleolus wound to heal and had the left medial malleolus down to about half its current size however and I had to send her to plastic surgery for an operative debridement. Since then things have been stable to slightly improve the area on the right is slightly better one in the left about the same although there is much less adherent surface than I'm used to with this patient. She is using some form of liquid collagen gel that Dr. Marla Roe provided a Kerlix cover with the patient's own pressure stockings. She tells me that she has extreme pain in both ankles and along the lateral aspect of both feet. She has been unable to work for some period of time. She is telling  me she is retiring at the beginning of April. She sees Dr. Doran Durand of orthopedics next week 09/22/16; patient has not seen Dr. Marla Roe since the last time she is here. I'm not really sure what she is using to the wounds other than bits and pieces of think she had left over including most recently Hydrofera Blue. She is using juxtalite stockings. She is having difficulty with her husband's recent illness "stroke". She is having to transport him to various doctors appointments. Dr. Marla Roe left her the option of a repeat debridement with ACEL however she has not been able to get the time to follow-up on this. She continues to have a fair amount of drainage out of these wounds with certainly precludes leaving dressings on all week 10/13/16; patient has not seen Dr. Marla Roe since she was last in our clinic. I'm not really sure what she is doing with the wounds, we did try to get her [facility] and I think she is actually using this most of the time. Because of drainage she states she has to change this every second day although this is an improvement from what she used to do. She went to see Dr. Doran Durand who did not think she had a muscular issue with regards to her feet, he referred her to a neurologist and I think the appointment is sometime in June. I changed her back to Iodoflex which she has used in the past but not recently. 11/03/16; the patient has been using Iodoflex although she ran out of this. Still claims that there is a lot of drainage although the wound does not look like this. No surrounding erythema. She has not been back to see Dr. Marla Roe 11/24/16; the patient has been using Iodoflex again but she ran out of it 2 or 3 days ago. There is no major change in the condition of either one of these  wounds in fact they are larger and covered in a thick adherent surface slough/nonviable tissue especially on the left. She does not tolerate mechanical debridement in our clinic. Going back  to see Dr. Marla Roe of plastic surgery for an operative debridement would seem reasonable. 12/15/16; the patient has not been back to see Dr. Marla Roe. She is been dealing with a series of illnesses and her husband which of monopolized her time. She is been using Sorbact which we largely supplied. She states the drainage is bad enough that it maximum she can go 2-3 days without changing the dressing 01/12/2017 -- the patient has not been back for about 4 weeks and has not seen Dr. Marla Roe not does she have any appointment pending. 01/23/17; patient has not seen Dr. Marla Roe even though I suggested this previously. She is using Santyl that was suggested last week by Dr. Con Memos this Cost her $16 through her insurance which is indeed surprising 02/12/17; continuing Santyl and the patient is changing this daily. A lot of drainage. She has not been back to see plastic surgery she is using an Ace wrap. Our intake nurse suggested wrap around stockings which would make a good reasonable alternative 02/26/17; patient is been using Santyl and changing this daily due to drainage. She has not been to see plastic surgery she uses in April Ace wrap to control the edema. She did obtain extremitease stockings but stated that the edema in her leg was to big for these 03/20/17; patient is using Santyl and Anasept. Surfaces looked better today the area on the right is actually measuring a little smaller. She has states she has a lot of pain in her feet and ankles and is asking for a consult to pain control which I'll try to help her with through our case manager. 04/10/17; the patient arrives with better-looking wound surfaces and is slightly smaller wound on the left she is using a combination of Santyl and Anasept. She has an appointment or at least as started in the pain control center associated with [facility] 05/14/17; this is a patient who I followed for a prolonged period of time. 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 At one point I had this down to a much smaller wound on the left however these reopened and we've never been able to get these to heal. She has been using Santyl and Anasept gel although 2 weeks ago she ran out of the Anasept gel. She has a stable appearance of the wound. She is going to the wound care clinic at [facility]. They wanted do a nerve block/spinal block although she tells me she is reluctant to go forward with that. 05/21/17; this is a patient I have followed for many years. 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 Chronic pain and deformity in her ankles as well. She is been to see plastic surgery as well as orthopedics. Using PolyMem AG most recently/Kerramax/ABDs and 2 layer compression. She has managed to keep this on and she is coming in for a nurse check to change the dressing on Tuesdays, we see her on Fridays 06/05/17; really quite a good looking surface and the area especially on the right medial has contracted in terms of dimensions. Well granulated healthy-looking tissue on both sides. Even with an open curet there is nothing that even feels abnormal here. This is as good as I've seen this in quite some time. We have been using PolyMem AG and  bringing her in for a nurse check 06/12/17; really quite good surface on both of these wounds. The right medial has contracted a bit left is not. We've been using PolyMem and AG and she is coming in for a nurse visit 06/19/17; we have been using PolyMem AG and bringing her in for a nurse check. Dimensions of her wounds are not better but the surfaces looked better bilaterally. She complained of bleeding last night and the left wound and increasing pain bilaterally. She states her wound pain is more neuropathic than just the wounds. There was some suggestion that this was radicular from her pain management doctor in talking to her it is really difficult to sort  this out. 06/26/17; using PolyMem and AG and bringing her in for a nurse check as All of this and reasonably stable condition. Certainly not improved. The dimensions on the lateral part of the right leg look better but not really measuring better. The medial aspect on the left is about the same. 07/03/16; we have been using PolyMen AG and bringing her in for a nurse check to change the dressings as the wounds have drainage which precludes once weekly changing. We are using all secondary absorptive dressings.our intake nurse is brought up the idea of using a wound VAC/snap VAC on the wound to help with the drainage to see if this would result in some contraction. This is not a bad idea. The area on the right medial is actually looking smaller. Both wounds have a reasonable-looking surface. There is no evidence of cellulitis. The edema is well controlled 07/10/17; the patient was denied for a snap VAC by her insurance. The major issue with these wounds continues to be drainage. We are using wicked PolyMem AG and she is coming in for a nurse visit to change this. The wounds are stable to slightly improved. The surface looks vibrant and the area on the right certainly has shrunk in size but very slowly 07/17/17; the patient still has large wounds on her bilateral medial malleoli. Surface of both of these wounds looks better. The dimensions seem to come and go but no consistent improvement. There is no epithelialization. We do not have options for advanced treatment products due to insurance issues. They did not approve of the wound VAC to help control the drainage. More recently we've been using PolyMem and AG wicked to allow drainage through. We have been bringing her in for a nurse visit to change this. We do not have a lot of options for wound care products and the home again due to insurance issues 07/24/17; the patient's wound actually looks somewhat better today. No drainage measurements are smaller still  healthy-looking surface. We used silver collagen under PolyMen started last week. We have been bringing her in for a dressing change 07/31/17; patient's wound surface continued to look better and I think there is visible change in the dimensions of the wound on the right. Rims of epithelialization. We have been using silver collagen under PolyMen and bringing her in for a dressing change. There appears to be less drainage although she is still in need of the dressing change 08/07/17. Patient's wound surface continues to look better on both sides and the area on the right is definitely smaller. We have been using silver collagen and PolyMen. She feels that the drainage has been it has been better. I asked her about her vascular status. She went to see Dr. Aleda Grana at [facility] and had some form of  ablation. I don't have much detail on this. I haven't my notes from 2016 that she was not a candidate for any further ablation but I don't have any more information on this. We had referred her to vein and vascular I don't think she ever went. He does not have a history of PAD although I don't have any information on this either. We don't even have ABIs in our record 08/14/17; we've been using silver collagen and PolyMen cover. And putting the patient and compression. She we are bringing her in as a nurse visit to change this because ofarge amount of drainage. We didn't the ABIs in clinic today since they had been done in many moons 1.2 bilaterally. She has been to see vein and vascular however this was at [facility] and she had ablation although I really don't have any information on this all seemed biking get a report. She is also been operatively debrided by plastic surgery and had a cell placed probably 8-12 months ago. This didn't have a major effect. We've been making some gains with current dressings 08/19/17-She is here in follow-up evaluation for bilateral medial malleoli ulcers. She continues  to tolerate debridement very poorly. We will continue with recently changed topical treatment; if no significant improvement may consider switching to Iodosorb/Iodoflex. 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 She has been using silver collagen and PolyMem. I believe she has been used and tried on Iodoflex before. 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 These have been very refractory over time. We have been using silver collagen and PolyMen. She is coming in once a week for a doctor's and once a week for nurse visits. We are actually making some progress 09/18/17; the patient's wounds are smaller especially on the right medial. She arrives today to upset to consider even washing these off with Anasept which I think is been part of the reason this is been closing. We've been using collagen covered in PolyMen otherwise. It is noted that she has a small area of folliculitis on the right medial calf that. As we are wrapping her legs I'll give her a short course of doxycycline to make sure this doesn't amount to anything. She is a long list of complaints today including imbalance, shortness of breath on exertion, inversion of her left ankle. With regards to the latter complaints she is been to see orthopedics and they offered her a tendon release surgery I believe but wanted her wounds to be closed first. I have recommended she go see her primary physician with regards to everything else. 09/25/17; patient's wounds are about the same size. We have made some progress bilaterally although not in recent weeks. She will not allow me T wash these wounds with Anasept even if she is doing her cell. Wheeze we've been using collagen covered in PolyMen. Last week she had a  small area of folliculitis this is now opened into a small wound. She completed 5 days of trimethoprim sulfamethoxazole 10/02/17; unfortunately the area on her left medial ankle is worse with a larger wound area towards the Achilles. The patient complains of a lot of pain. She will not allow debridement although visually I don't think there is anything to debridement in any case. We have been using silver  collagen and PolyMen for several months now. Initially we are making some progress although I'm not really seeing that today. We will move back to [facility]. His admittedly this is a bit of a repeat however I'm hoping that his situation is different now. The patient tells me she had her leg on the left give out on her yesterday this is process some pain. 10/09/17; the patient is seen twice a week largely because of drainage issues coming out of the chronic medial bimalleolar wounds that are chronic. Last week the dimensions of the one on the left looks a little larger I changed her to [facility]. She comes in today with a history of terrible pain in the bilateral wound areas. She will not allow debridement. She will not even allow a tissue culture. There is no surrounding erythema no no evidence of cellulitis. We have been putting her Kerlix Coban man. She will not allow more aggressive compression as there was a suggestion to put her in 3 layer wraps. 10/16/17; large wounds on her bilateral medial malleoli. These are chronic. Not much change from last week. The surface looks have healthy but absolutely no epithelialization. A lot of pain little less so of drainage. She will not allow debridement or even washing these off in the vigorous fashion with Anasept. 10/23/17; large wounds on her bilateral malleoli which are chronic. Some improvement in terms of size perhaps on the right since last time I saw these. She states that after we increased the 3 layer compression there was some bleeding, when  she came in for a nurse visit she did not want 3 layer compression put back on about our nurse managed to convince her. She has known chronic venous visit issues and I'm hoping to get her to tolerate the 3 layer compression. using Hydrofera Blue 10/30/17; absolutely no change in the condition of either wound although we've had some improvement in dimensions on the right.. Attempted to put her in 3 layer compression she didn't tolerated she is back in 2 layer compression. We've been using Hydrofera Blue We looked over her past records. She had venous reflux studies in November 2016. There was no evidence of deep venous reflux on the right. Superficial vein did not show the greater saphenous vein at think this is been previously ablated the small saphenous vein was within normal limits. The left deep venous system showed no DVT the vessels were positive for deep venous reflux in the posterior tibial veins at the ankle. The greater saphenous vein was surgically absent small saphenous vein was within normal limits. She went to vein and vascular at [facility]. I believe she had an ablation on the left greater saphenous vein. I'll update her reflux studies perhaps ever reviewed by vein and vascular. We've made absolutely no progress in these wounds. Will also try to read and TheraSkins through her insurance 11/06/17; W the patient apparently has a 2 week follow-up with vein and vascular I like him to review the whole issue with regards to her previous vascular workup by Dr. Aleda Grana. We've really made no progress on these wounds in many months. She arrives today with less viable looking surface on the left medial ankle wound. This was apparently looking about the same on Tuesday when she was here for nurse visit. 11/13/17; deep tissue culture I did last time of the left lower leg showed multiple organisms without any predominating. In particular no Staphylococcus or group A strep were isolated. We sent  her for venous reflux studies. She's had a previous left greater saphenous vein stripping and I think sclerotherapy of the right greater saphenous vein. She didn't really look at the lesser saphenous vein this both wounds are on the medial aspect. She has reflux in the common femoral vein and popliteal vein and an accessory vein on the right and the common femoral vein and popliteal vein on the left. I'm going to have her go to see vein and vascular just the look over things and see if anything else beside aggressive compression is indicated here. We have not been able to make any progress on these wounds in spite of the fact that the surface of the wounds is never look too bad. 11/20/17; no major change in the condition of the wounds. Patient reports a large amount of drainage. She has a lot of complaints of pain although enlisting her today I wonder if some of this at least his neuropathic rather than secondary to her wounds. She has an appointment with vein and vascular on 12/30/17. The refractory nature of these wounds in my mind at least need vein and vascular to look over the wounds the recent reflux studies we did and her history to see if anything further can be done here. I also note her gait is deteriorated quite a bit. 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 She has a bilateral Trendelenburg gait. I wonder if this is neuropathic or perhaps multilevel radicular. 11/27/17; her wounds actually looks slightly better. Healthy-looking granulation tissue a scant amount of epithelialization. [place] healthcare will not pay for [employer]. They will play for tri layer Oasis and Dermagraft. This is not a diabetic ulcer. We'll try for the tri layer Oasis. She still complains of some drainage. She has a vein and vascular appointment on 12/30/17 12/04/17; the wounds visually look quite good. Healthy-looking granulation with some degree of epithelialization. We are still waiting for response to our  request for trial to try layer Oasis. Her appointment with vascular to review venous and arterial issues isn't sold the end of July 7/31. Not allow debridement or even vigorous cleansing of the wound surface. 12/18/17; slightly smaller especially on the right. 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 We've been using Hydrofera Blue. We still are looking into triple layer Oasis through her insurance 01/08/18 on evaluation today patient's wound actually appears to be showing signs of good improvement at this point in time. She has been tolerating the dressing changes without complication. Fortunately there does not appear to be any evidence of infection at this point in time. We have been utilizing silver nitrate which does seem to be of benefit for her which is also good news. Overall I'm very happy with how things seem to be both regards appearance as well as measurement. Patient did see Dr. Bridgett Larsson for evaluation on 12/30/17. In his assessment he felt that stripping would not likely add much more than chronic compression to the patient's healing process. His recommendation was to follow-up in three months with Dr. Doren Custard if she hasn't healed in order to consider referral back to you and see vascular where she previously was in a trial and was able to get her wound to heal. I'll be see what she feels she when you staying compression and he reiterated this as well. 01/13/18 on evaluation today patient appears to actually be doing very well in regard to her bilateral medial malleolus ulcers. She seems to have tolerated the  chemical cauterization with silver nitrate last week she did have some pain through that evening but fortunately states that I'll be see since it seems to be doing better she is overall pleased with the progress. 01/21/18; really quite a remarkable improvement since I've last seen these wounds. We started using silver nitrate specially on the islands of hyper  granulation which for some reason her around the wound circumference. This is really done quite nicely. Primary dressing Hydrofera Blue under 4 layer compression. She seems to be able to hold out without a nurse rewrap. Follow-up in 1 week 01/28/18; we've continued the hydrofera blue but continued with chemical cauterization to the wound area that we started about a month ago for irregular hyper granulation. She is made almost stunning improvement in the overall wound dimensions. I was not really expecting this degree of improvement in these chronic wounds 02/05/18; we continue with Hydrofera Bluebut of also continued the aggressive chemical cauterization with silver nitrate. We made nice progress with the right greater than left wound. 02/12/18. We continued with Hydrofera Blue after aggressive chemical cauterization with silver nitrate. We appear to be making nice progress with both wound areas 02/19/2018; we continue with [facility] after washing the wounds vigorously with Anasept spray and chemical cauterization with silver nitrate. We are making excellent progress. The area on the right's just about closed 02/26/2018. The area on the left medial ankle had too much necrotic debris today. I used a #5 curette we are able to get most of the soft. I continued with the silver nitrate to the much smaller wound on the right medial ankle she had a new area on her right lower pretibial area which she says was due to a role in her compression 03/05/2018; both wound areas look healthy. Not much change in dimensions from last week. I continue to use silver nitrate and Hydrofera Blue. The patient saw Dr. Doren Custard of vein and vascular. He felt she had venous stasis ulcers. He felt based on her previous arteriogram she should have adequate circulation for healing. Also she has deep venous reflux but really no significant correctable superficial venous reflux at this time. He felt we should continue with  conservative management including leg elevation and compression 04/02/2018; since we last saw this woman about a month ago she had a fall apparently suffered a pelvic fracture. I did not look up the x-ray. Nevertheless because of pain she literally was bedbound for 2 weeks and had home health coming out to change the dressing. Somewhat predictably this is resulted in considerable improvement in both wound areas. The right is just about closed on the medial malleolus and the left is about half the size. 04/16/2018; both her wounds continue to go down in size. Using Hydrofera Blue. 05/07/18; both her wounds appeared to be improving especially on the right where it is almost closed. We are using Hydrofera Blue 05/14/2018; slightly worse this week with larger wounds. Surface on the left medial not quite as good. We have been using Hydrofera Blue 05/21/18; again the wounds are slightly larger. Left medial malleolus slightly larger with eschar around the circumference. We have been using Hydrofera Blue undergoing a wraps for a prolonged period of time. This got a lot better when she was more recumbent due to a fall and a back injury. I change the primary dressing the silver alginate today. She did not tolerate a 4 layer compression previously although I may need to bring this up with her next time  05/28/2018; area on the left medial malleolus again is slightly larger with more drainage. Area on the right is roughly unchanged. She has a small area of folliculitis on the right medial just on the lower calf. This does not look ominous. 06/03/2018 left medial malleolus slightly smaller in a better looking surface. We used silver nitrate on this last time with silver alginate. The area on the right appears slightly smaller 1/10; left medial malleolus slightly smaller. Small open area on the right. We used silver nitrate and silver alginate as of 2 weeks ago. We continue with the wound and compression. These got a  lot better when she was off her feet 1/17; right medial malleolus wound is smaller. The left may be slightly smaller. Both surfaces look somewhat better. 1/24; both wounds are slightly smaller. Using silver alginate under Unna boots 1/31; both wounds appear smaller in fact the area on the right medial is just about closed. Surface eschar. We have been using silver alginate under Unna boots. The patient is less active now spends let much less time on her feet and I think this is contributed to the general improvement in the wound condition 2/7; both wounds appear smaller. I was hopeful the right medial would be closed however there there is still the same small open area. Slight amount of surface eschar on the left the dimensions are smaller there is eschar but the wound edges appear to be free. We have been using silver alginate under Unna boot's 2/14; both wounds once again measure smaller. Circumferential eschar on the left medial. We have been using silver alginate under Unna boots with gradual improvement 2/21; the area on the right medial malleolus has healed. The area on the left is smaller. We have been using silver alginate and Unna boots. We can discharge wrapping the right leg she has 20/30 stockings at home she will need to protect the scar tissue in this area 2/28; the area on the right medial malleolus remains closed the patient has a compression stocking. The area on the left is smaller. We have been using silver alginate and Unna boots. 3/6 the area on the right medial ankle remains closed. Good edema control noted she is using her own compression stocking. The area on the left medial ankle is smaller. We have been managing this with silver alginate and Unna boots which we will continue today. 3/13; the area on the right medial ankle remains closed and I'm declaring it healed today. When necessary the left is about the same still a healthy-looking surface but no major change and wound  area. No evidence of infection and using silver alginate under unna and generally making considerable improvement 3/27 the area on the right medial ankle remains closed the area on the left is about the same as last week. Certainly not any worse we have been using silver alginate under an Unna boot 4/3; the area on the right medial ankle remains closed per the patient. We did not look at this wound. The wound on the left medial ankle is about the same surface looks healthy we have been using silver alginate under an Unna boot 4/10; area on the right medial ankle remains closed per the patient. We did not look at this wound. The wound on the left medial ankle is slightly larger. The patient complains that the [facility] caused burning pain all week. She also told us that she was a lot more active this week. Changed her back to silver  alginate 4/17; right medial ankle still closed per the patient. Left medial ankle is slightly larger. Using silver alginate. She did not tolerate Hydrofera Blue on this area 4/24; right medial ankle remains closed we have not look at this. The left medial ankle continues to get larger today by about a centimeter. We have been using silver alginate under Unna boots. She complains about 4 layer compression as an alternative. She has been up on her feet working on her garden 5/8; right medial ankle remains closed we did not look at this. The left medial ankle has increased in size about 100%. We have been using silver alginate under Unna boots. She noted increased pain this week and was not surprised that the wound is deteriorated 5/15; no major change in SA however much less erythema ( one week of doxy ocellulitis). 5/22-65 year old female returns at 1 week to the clinic for left medial ankle wound for which we have been using silver alginate under 3 layer compression She was placed on DOXY at last visit - the wound is wider at this visit. She is in 3 layer  compression 5/29; change to [facility] last week. I had given her empiric doxycycline 2 weeks ago for a week. She is in 3 layer compression. She complains of a lot of pain and drainage on presentation today. 6/5; using Hydrofera Blue. I gave her doxycycline recently empirically for erythema and pain around the wound. Believe her cultures showed enterococcus which not would not have been well covered by doxycycline nevertheless the wound looks better and I don't feel specifically that the enterococcus needs to be covered. She has a new what looks like a wrap injury on her lateral left ankle. 6/12; she is using Hydrofera Blue. She has a new area on the left anterior lower tibial area. This was a wrap injury last week. 6/19; the patient is using Hydrofera Blue. She arrived with marked inflammation and erythema around the wound and tenderness. 12/01/18 on evaluation today patient appears to be doing a little bit better based on what I'm hearing from the standpoint of lassos evaluation to this as far as the overall appearance of the wound is concerned. Then sometime substandard she typically sees Dr. Dellia Nims. Nonetheless overall very pleased with the progress that she's made up to this point. No fevers, chills, nausea, or vomiting noted at this time. 7/10; some improvement in the surface area. Aggressively debrided last week apparently. I went ahead with the debridement today although the patient does not tolerate this very well. We have been using Iodoflex. Still a fair amount of drainage 7/17; slightly smaller. Using Iodoflex. 7/24; no change from last week in terms of surface area. We have been using Iodoflex. Surface looks and continues to look somewhat better 7/31; surface area slightly smaller better looking surface. We have been using Iodoflex. This is under Unna boot compression 8/7-Patient presents at 1 week with Unna boot and Iodoflex, wound appears better 8/14-Patient presents at 1 week with  Iodoflex, we use the Unna boot, wound appears to be stable better.Patient is getting Botox treatment for the inversion of the foot for tendon release, Next week 8/21; we are using Iodoflex. Unna boot. The wound is stable in terms of surface area. Under illumination there is some areas of the wound that appear to be either epithelialized or perhaps this is adherent slough at this point I was not really clear. It did not wipe off and I was reluctant to debride this today. 8/28;  we are using Iodoflex in an [employer]. Seems to be making good improvement. 9/4; using Iodoflex and wound is slightly smaller. 9/18; we are using Iodoflex with topical silver nitrate when she is here. The wound continues to be smaller 10/2; patient missed her appointment last week due to GI issues. She left and Iodoflex based dressing on for 2 weeks. Wound is about the same size about the size of a dime on the left medial lower 10/9 we have been using Iodoflex on the medial left ankle wound. She has a new superficial probable wrap injury on the dorsal left ankle 10/16; we have been using Hydrofera Blue since last week. This is on the left medial ankle 10/23; we have been using Hydrofera Blue since 2 weeks ago. This is on the left medial ankle. Dimensions are better 11/6; using Hydrofera Blue. I think the wound is smaller but still not closed. Left medial ankle 11/13; we have been using Hydrofera Blue. Wound is certainly no smaller this week. Also the surface not as good. This is the remanent of a very large area on her left medial ankle. 11/20; using Sorbact since last week. Wound was about the same in terms of size although I was disappointed about the surface debris 12/11; 3-week follow-up. Patient was on vacation. Wound is measuring slightly larger we have been using Sorbact. 12/18; wound is about the same size however surface looks better last week after debridement. We have been using Sorbact under compression 1/15 wound  is probably twice the size of last time increased in length nonviable surface. We have been using Sorbact. She was running a mild fever and missed her appointment last week 1/22; the wound is come down in size but under illumination still a very adherent debris we have been Hydrofera Blue that I changed her to last week 1/29; dimensions down slightly. We have been using Hydrofera Blue 2/19 dimensions are the same however there is rims of epithelialization under illumination. Therefore more the surface area may be epithelialized 2/26; the patient's wound actually measures smaller. The wound looks healthy. We have been using Hydrofera Blue. I had some thoughts about running Apligraf then I still may do that however this looks so much better this week we will delay that for now 3/5; the wound is small but about the same as last week. We have been using Hydrofera Blue. No debridement is required today. 3/19; the wound is about the size of a dime. Healthy looking wound even under illumination. We have been using Hydrofera Blue. No mechanical debridement is necessary 3/26; not much change from last week although still looks very healthy. We have been using Hydrofera Blue under Unna boots Patient was offered an ankle fusion by podiatry but not until the wound heals with a proceed with this. 4/9; the patient comes in today with her original wound on the medial ankle looking satisfactory however she has some uncontrolled swelling in the middle part of her leg with 2 new open areas superiorly just lateral to the tibia. I think this was probably a wrap issue. She said she felt uncomfortable during the week but did not call in. We have been using Hydrofera Blue 4/16; the wound on the medial ankle is about the same. She has innumerable small areas superior to this across her mid tibia. I think this is probably folliculitis. She is also been working in the yard doing a lot of sweating 4/30; the patient issue on  the upper areas across  her mid tibia of all healed. I think this was excessive yard work if I remember. Her wound on the medial ankle is smaller. Some debris on this we have been using Hydrofera Blue under Unna boots 5/7; mid tibia. She has been using Hydrofera Blue under an Unna wrap. She is apparently going for her ankle surgery on June 3 10/28/19-Patient returns to clinic with the ankle wound, we are using Hydrofera Blue under Unna wrap, surgery is scheduled for her left foot for June 3 so she will be back for nurse visit next week READMISSION 01/17/2020 [name] is a 65 year old woman we have had in this clinic for a long period of time with severe venous hypertension and refractory wounds on her medial lower legs and ankles bilaterally. 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 When she was off her legs for a prolonged period example when she fell and suffered a compression fracture things would heal up quite nicely. She is now retired and we managed to heal up the right medial leg wound. The left one was very tiny last time I saw this although still refractory. 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 It got to the point where this was interfering with her walking and she elected to proceed with a ankle arthrodesis to straighten her her ankle and leave her with a functional outcome for mobilization. The patient was referred to Dr. Doren Custard and really this took some time to arrange. Dr. Doren Custard saw her on 12/07/2019. Once again he verified that she had no arterial issues. She had previously had an angiogram several years ago. Follow-up ABIs on the left showed an ABI of 1.12 with triphasic waveforms and a TBI of 0.92. She is felt to have chronic deep venous insufficiency but I do not think  it was felt that anything could be done from about this from an ablation point of view. At the time Dr. Doren Custard saw this patient the wounds actually look closed via the pictures in his clinic. The patient finally underwent her surgery on 12/15/2019. This went reasonably well and there was a good anatomic outcome. She developed a small distal wound dehiscence on the lateral part of the surgical wound. However more problematically she is developed recurrence of the wound on the medial left ankle. There are actually 2 wounds here one in the distal lower leg and 1 pretty much at the level of the medial malleolus. It is a more distal area that is more problematic. She has been using Hydrofera Blue which started on Friday before this she was simply Ace wrapping. There was a culture done that showed Pseudomonas and she is on ciprofloxacin. A recent CNS on 8/11 was negative. The patient reports some pain but I generally think this is improving. She is using a cam boot completely nonweightbearing using a walker for pivot transfers and a wheelchair 8/24; not much improvement unfortunately she has a surgical wound on the lateral part in the venous insufficiency wound medially. The bottom part of the medial insufficiency wound is still necrotic there is exposed tendon here. We have been using Hydrofera Blue under compression. Her edema control is however better 8/31; patient in for follow-up of his surgical wound on the lateral part of her left leg and chronic venous  insufficiency ulcers medially. We put her back in compression last week. She comes in today with a complaint of 3 or 4 days worth of increasing pain. She felt her cam walker was rubbing on the area on the back of her heel. However there is intense erythema seems more likely she has cellulitis. She had 2 cultures done when she was seeing podiatry in the postop. One of them in late July showed Pseudomonas and she received a course of ciprofloxacin the  other was negative on 8/11 she is allergic to penicillin with anaphylactoid complaints of hives oral swelling via information in epic 9/9; when I saw this patient last week she had intense anterior erythema around her wound on the right lateral heel and ankle and also into the right medial heel. Some of this was no doubt drainage and her walker boot however I was convinced she had cellulitis. I gave her Levaquin and Bactrim she is finishing up on this now. She is following up with Dr. [name] he saw her yesterday. He is taken her out of the walking boot of course she is still nonweightbearing. Her x-ray was negative for any worrisome features such as soft tissue air etc. Things are a lot better this week. She has home health. We have been using Hydrofera Blue under an [employer] which she put back on yesterday. I did not wrap her last week 9/17; her surrounding skin looks a lot better. In fact the area on the left lateral ankle has just a scant amount of eschar. The only remaining wound is the large area on the left medial ankle. Probably about 60% of this is healthy granulation at the surface however she has a significant divot distally. This has adherent debris in it. I been using debridement and silver collagen to try and get this area to fill-in although I do not think we have made much progress this week 9/24; the patient's wound on the left medial ankle looks a lot better. The deeper divot area distally still requires debridement but this is cleaning up quite nicely we have been using silver collagen. The patient is complaining of swelling in her foot and is worried that that is contributing to the nonhealing of the ankle wound. She is also complaining of numbness in her anterior toes 10/4; left medial ankle. The small area distally still has a divot with necrotic material that I have been debriding away. This has an undermining area. She is approved for Apligraf. She saw Dr. [name] her surgeon on  10/1. I think he declared himself is satisfied with the condition of things. Still nonweightbearing till the end of the month. 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 Her surgical wound is well closed. There is no evidence of infection 10/11; the patient arrived in clinic today with the expectation that we be able to put an Apligraf on this area after debridement however she arrives with a relatively large amount of green drainage on the dressing. The patient states that this started on Friday. She has not been systemically unwell. 10/19; culture I did last week showed both Enterococcus and Pseudomonas. I think this came in separate parts because I stopped her ciprofloxacin I gave her and prescribed her linezolid however now looking at the final culture result this is Pseudomonas which is resistant to quinolones. She has not yet picked up the linezolid apparently phone issues. We are also trying to get a topical antibiotic out of [place]  in [place] they can be applied by home health. She is still having green drainage 10/16; the patient has her topical antibiotic from [facility] in [place]. This is a compounded gel with vancomycin and ciprofloxacin and gentamicin. We are applying this on the wound bed with silver alginate over the top with Unna boot wraps. She arrives in clinic today with a lot less ominous looking drainage although she is only use this topical preparation once the second time today. She sees Dr. [name] her surgeon on Friday she has home health changing the dressing 11/2; still using her compounded topical antibiotic under silver alginate. Surface is cleaning up there is less drainage. We had an Apligraf for her today and I elected to apply it. A light coating of her antibiotic 04/25/2020 upon evaluation today patient appears to be doing well with regard to her ankle ulcer. There is a little bit of slough noted on the surface of the  wound I am can have to perform sharp debridement to clear this away today. With that being said other than that fact overall I feel like she is making progress and we do see some new epithelial growth. There is also some improvement in the depth of the wound and that distal portion. There is little bit of slough there as well. 12/7; 2-week follow-up. Apligraf #3. Dimensions are smaller. Closing in especially inferiorly. Still some surface debris. Still using the [facility] topical antibiotic but I told her that I don't think this needs to be renewed 12/21; 2-week follow-up. Apligraf #4 dimensions are smaller. Nice improvement 06/05/2020; 2-week follow-up. The patient's wound on the left medial ankle looks really excellent. Nice granulation. Advancing epithelialization no undermining no evidence of infection. We would have to reapply for another Apligraf but with the condition of this wound I did not feel strongly about it. We used Hydrofera Blue under the same degree of compression. She follows up with Dr. [name] her surgeon a week Friday 06/13/2020 upon evaluation today patient appears to be doing excellent in regard to her wound. She has been tolerating the dressing changes without complication. Fortunately there is no signs of active infection at this time. No fevers, chills, nausea, vomiting, or diarrhea. She was using Hydrofera Blue last week. 06/20/2020 06/20/2020 on evaluation today patient actually appears to be doing excellent in regard to her wound. This is measuring better and looking much better as well. She has been using the collagen that seems to be doing better for her as well even though the [facility] was and is not sticking or feeling as rough on her wound. She did see Dr. [name] on Friday he is very pleased he also stated none of the hardware has shifted. That is great news 1/27; the patient has a small clean wound all that is remaining. I agree that this is too small to really  consider an Apligraf. Under illumination the surface is looking quite good. We have been using collagen although the dimensions are not any better this week 2/2; the patient has a small clean wound on the left medial ankle. Although this left of her substantial original areas. Measurements are smaller. We have been using polymen Ag under an Haematologist. 2/10; small area on the left medial ankle. This looks clean nothing to debride however dimensions are about the same we have been using polymen I think now for 2 weeks 2/17; not much change in surface area. We have been using polymen Ag without any improvement Electronic Signature(s)  Signed: 07/19/2020 5:29:55 PM By: Linton Ham MD Entered By: Linton Ham on 07/19/2020 11:22:43 -------------------------------------------------------------------------------- Physical Exam Details Patient Name: Date of Service: [name], [name] 07/19/2020 10:30 A M Medical Record Number: [id number] Patient Account Number: [id number] Date of Birth/Sex: Treating RN: April 26, 1956 (65 y.o. [name] Primary Care Provider: [name] Other Clinician: Referring Provider: Treating Provider/Extender: [name] in Treatment: 26 Constitutional Patient is hypertensive.. Pulse regular and within target range for patient.Marland Kitchen Respirations regular, non-labored and within target range.. Temperature is normal and within the target range for the patient.Marland Kitchen Appears in no distress. Notes Wound exam; light debridement of surface slough. Surface of the wound looks complete lately viable. I cannot understand why this will not epithelialize there is no uncontrolled edema and no evidence of infection Electronic Signature(s) Signed: 07/19/2020 5:29:55 PM By: Linton Ham MD Entered By: Linton Ham on 07/19/2020 11:25:02 -------------------------------------------------------------------------------- Physician Orders Details Patient  Name: Date of Service: [name], [name] 07/19/2020 10:30 A M Medical Record Number: [id number] Patient Account Number: [id number] Date of Birth/Sex: Treating RN: August 23, 1955 (65 y.o. [name], Meta.Reding Primary Care Provider: [name] Other Clinician: Referring Provider: Treating Provider/Extender: [name] in Treatment: 91 Verbal / Phone Orders: No Diagnosis Coding ICD-10 Coding Code Description I87.332 Chronic venous hypertension (idiopathic) with ulcer and inflammation of left lower extremity L97.828 Non-pressure chronic ulcer of other part of left lower leg with other specified severity L97.328 Non-pressure chronic ulcer of left ankle with other specified severity Follow-up Appointments Return Appointment in 2 weeks. Bathing/ Shower/ Hygiene May shower with protection but do not get wound dressing(s) wet. Edema Control - Lymphedema / SCD / Other Elevate legs to the level of the heart or above for 30 minutes daily and/or when sitting, a frequency of: Avoid standing for long periods of time. Exercise regularly Compression stocking or Garment 20-30 mm/Hg pressure to: - right leg daily Wound Treatment Wound #15 - Malleolus Wound Laterality: Left, Medial Cleanser: Soap and Water 1 x Per Week/15 Days Discharge Instructions: May shower and wash wound with dial antibacterial soap and water prior to dressing change. Peri-Wound Care: Sween Lotion (Moisturizing lotion) 1 x Per Week/15 Days Discharge Instructions: Apply moisturizing lotion as directed Prim Dressing: Endoform 2x2 in ary 1 x Per Week/15 Days Discharge Instructions: Moisten with saline Secondary Dressing: Woven Gauze Sponge, Non-Sterile 4x4 in (Home Health) 1 x Per Week/15 Days Discharge Instructions: Apply over primary dressing as directed. Secondary Dressing: ABD Pad, 5x9 (Home Health) 1 x Per Week/15 Days Discharge Instructions: Apply over primary dressing as directed. Compression Wrap:  Unnaboot w/Calamine, 4x10 (in/yd) (Home Health) 1 x Per Week/15 Days Discharge Instructions: Apply Unnaboot as directed. Electronic Signature(s) Signed: 07/19/2020 5:29:55 PM By: Linton Ham MD Signed: 07/19/2020 5:51:10 PM By: Deon Pilling Entered By: Deon Pilling on 07/19/2020 11:14:28 -------------------------------------------------------------------------------- Problem List Details Patient Name: Date of Service: [name], [name] 07/19/2020 10:30 A M Medical Record Number: [id number] Patient Account Number: [id number] Date of Birth/Sex: Treating RN: [birth year] (65 y.o. [name] Primary Care Provider: [name] Other Clinician: Referring Provider: Treating Provider/Extender: [name] in Treatment: 26 Active Problems ICD-10 Encounter Code Description Active Date MDM Diagnosis I87.332 Chronic venous hypertension (idiopathic) with ulcer and inflammation of left 01/17/2020 No Yes lower extremity L97.828 Non-pressure chronic ulcer of other part of left lower leg with other specified 01/17/2020 No Yes severity L97.328 Non-pressure chronic ulcer of left ankle  with other specified severity 01/17/2020 No Yes Inactive Problems ICD-10 Code Description Active Date Inactive Date L03.116 Cellulitis of left lower limb 01/31/2020 01/31/2020 T81.31XD Disruption of external operation (surgical) wound, not elsewhere classified, subsequent 01/17/2020 01/17/2020 encounter Resolved Problems Electronic Signature(s) Signed: 07/19/2020 5:29:55 PM By: Linton Ham MD Entered By: Linton Ham on 07/19/2020 11:21:04 -------------------------------------------------------------------------------- Progress Note Details Patient Name: Date of Service: [name], [name] 07/19/2020 10:30 A M Medical Record Number: [id number] Patient Account Number: [id number] Date of Birth/Sex: Treating RN: 09/21/1955 (65 y.o. [name] Primary Care Provider: [name] Other Clinician: Referring Provider: Treating Provider/Extender: [name] in Treatment: 26 Subjective History of Present Illness (HPI) the remaining wound is over the left medial ankle. Similar wound over the right medial ankle healed largely with use of Apligraf. Most recently we have been using Hydrofera Blue over this wound with considerable improvement. The patient has been extensively worked up in the past for her venous insufficiency and she is not a candidate for antireflux surgery although I have none of the details available currently. 08/24/14; considerable improvement today. About 50% of this wound areas now epithelialized. The base of the wound appears to be healthier granulation.as opposed to last week when she had deteriorated a considerable improvement 08/17/14; unfortunately the wound has regressed somewhat. The areas of epithelialization from the superior aspect are not nearly as healthy as they were last week. The patient thinks her Hydrofera Blue slipped. 09/07/14; unfortunately the area has markedly regressed in the 2 weeks since I've seen this. There is an odor surrounding erythema. The healthy granulation tissue that we had at the base of the wound now is a dusky color. The nurse reports green drainage 09/14/14; the area looks somewhat better than last week. There is less erythema and less drainage. The culture I did did not show any growth. Nevertheless I think it is better to continue the Cipro and doxycycline for a further week. The remaining wound area was debridement. 09/21/14. Wound did not require debridement last week. Still less erythema and less drainage. She can complete her antibiotics. The areas of epithelialization in the superior aspect of the wound do not look as healthy as they did some weeks ago 10/05/14 continued improvement in the condition of this wound. There is advancing epithelialization. Less aggressive debridement  required 10/19/14 continued improvement in the condition and volume of this wound. Less aggressive debridement to the inferior part of this to remove surface slough and fibrinous eschar 11/02/14 no debridement is required. The surface granulation appears healthy although some of her islands of epithelialization seem to have regressed. No evidence of infection 11/16/14; lites surface debridement done of surface eschar. The wound does not look to be unhealthy. No evidence of infection. Unfortunately the patient has had podiatry issues in the right foot and for some reason has redeveloped small surface ulcerations in the medial right ankle. Her original presentation involved wounds in this area 11/23/14 no debridement. The area on the right ankle has enlarged. The left ankle wound appears stable in terms of the surface although there is periwound inflammation. There has been regression in the amount of new skin 11/30/14 no debridement. Both wound areas appear healthy. There was no evidence of infection. The the new area on the right medial ankle has enlarged although that both the surfaces appear to be stable. 12/07/14; Debridement of the right medial ankle wound. No no debridement was done on the left. 12/14/14 no major change  in and now bilateral medial ankle wounds. Both of these are very painful but the no overt evidence of infection. She has had previous venous ablation 12/21/14; patient states that her right medial ankle wound is considerably more painful last week than usual. Her left is also somewhat painful. She could not tolerate debridement. The right medial ankle wound has fibrinous surface eschar 12/28/14 this is a patient with severe bilateral venous insufficiency ulcers. For a considerable period of time we actually had the one on the right medial ankle healed however this recently opened up again in June. The left medial ankle wound has been a refractory area with some absent flows. We had some  success with Hydrofera Blue on this area and it literally closed by 50% however it is recently opened up Foley. Both of these were debridement today of surface eschar. She tolerates this poorly 01/25/15: No change in the status of this. Thick adherent escar. Very poor tolerance of any attempt at debridement. I had healed the right medial malleolus wound for a considerable amount of time and had the left one down to about 50% of the volume although this is totally regressed over the last 48 weeks. Further the right leg has reopened. she is trying to make a appointment with pain and vascular, previous ablations with Dr. Aleda Grana. I do not believe there is an arterial insufficiency issue here 02/01/15 the status of the adherent eschar bilaterally is actually improved. No debridement was done. She did not manage to get vascular studies done 02/08/15 continued debridement of the area was done today. The slough is less adherent and comes off with less pressure. There is no surrounding infection peripheral pulses are intact 02/15/15 selective debridement with a disposable curette. Again the slough is less adherent and comes off with less difficulty. No surrounding infection peripheral pulses are intact. 02/22/15 selective debridement of the right medial ankle wound. Slough comes off with less difficulty. No obvious surrounding infection peripheral pulses are intact I did not debridement the one on the left. Both of these are stable to improved 03/01/15 selective debridement of both wound areas using a curette to. Adherent slough cup soft with less difficulty. No obvious surrounding infection. The patient tells me that 2 days ago she noted a rash above the right leg wrap. She did not have this on her lower legs when she change this over she arrives with widespread left greater than right almost folliculitis-looking rash which is extremely pruritic. I don't see anything to culture here. There is no rash on the  rest of her body. She feels well systemically. 03/08/15; selective debridement of both wounds using a curette. Base of this does not look unhealthy. She had limegreen drainage coming out of the left leg wound and describes a lot of drainage. The rash on her left leg looks improved to. No cultures were done. 03/22/15; patient was not here last week. Basal wounds does not look healthy and there is no surrounding erythema. No drainage. There is still a rash on the left leg that almost looks vasculitic however it is clearly limited to the top of where the wrap would be. 04/05/15; on the right required a surgical debridement of surface eschar and necrotic subcutaneous tissue. I did not debridement the area on the left. These continue to be large open wounds that are not changing that much. We were successful at one point in healing the area on the right, and at the same time the area on the left  was roughly half the size of current measurements. I think a lot of the deterioration has to do with the prolonged time the patient is on her feet at work 04/19/15 I attempted-like surface debridement bilaterally she does not tolerate this. She tells me that she was in allergic care yesterday with extreme pain over her left lateral malleolus/ankle and was told that she has an "sprain" 05/03/15; large bilateral venous insufficiency wounds over the medial malleolus/medial aspect of her ankles. She complains of copious amounts of drainage and his usual large amounts of pain. There is some increasing erythema around the wound on the right extending into the medial aspect of her foot to. historically she came in with these wounds the right one healed and the left one came down to roughly half its current size however the right one is reopened and the left is expanded. 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 05/10/15 large bilateral venous insufficiency wounds. There is  less adherence surface left however the surface culture that I did last week grew pseudomonas therefore bilateral selective debridement score necessary. There is surrounding erythema. The patient describes severe bilateral drainage and a lot of pain in the left ankle. Apparently her podiatrist was were ready to do a cortisone shot 05/17/15; the patient complains of pain and again copious amounts of drainage. 05/24/15; we used Iodo flex last week. Patient notes considerable improvement in wound drainage. Only needed to change this once. 05/31/15; we continued Iodoflex; the base of these large wounds bilaterally is not too bad but there is probably likely a significant bioburden here. I would like to debridement just doesn't tolerate it. 06/06/14 I would like to continue the Iodoflex although she still hasn't managed to obtain supplies. She has bilateral medial malleoli or large wounds which are mostly superficial. Both of them are covered circumferentially with some nonviable fibrinous slough although she tolerates debridement very poorly. She apparently has an appointment for an ablation on the right leg by interventional radiology. 06/14/15; the patient arrives with the wounds and static condition. We attempted a debridement although she does not do well with this secondary to pain. I 07/05/15; wounds are not much smaller however there appears to be a cleaner granulating base. The left has tight fibrinous slough greater than the right. Debridement is tolerated poorly due to pain. Iodoflex is done more for these wounds in any of the multitude of different dressings I have tried on the left 1 and then subsequently the right. 07/12/15; no change in the condition of this wound. I am able to do an aggressive debridement on the right but not the left. She simply cannot tolerate it. We have been using Iodoflex which helps somewhat. It is worthwhile remembering that at one point we healed the right medial ankle  wound and the left was about 25% of the current circumference. We have suggested returning to vascular surgery for review of possible further ablations for one reason or another she has not been able to do this. 07/26/15 no major change in the condition of either wound on her medial ankle. I did not attempt to debridement of these. She has been aggressively scrubbing these while she is in the shower at home. She has her supply of Iodoflex which seems to have done more for these wounds then anything I have put on recently. 08/09/15 wound area appears larger although not verified by measurements. Using Iodoflex 09/05/2015 --  she was here for avisit today but had significant problems with the wound and I was asked to see her for a physician opinion. I have summarize that this lady has had surgery on her left lower extremity about 10 years ago where the possible veins stripping was done. She has had an opinion from interventional radiology around November 2016 where no further sclerotherapy was ordered. The patient works 12 hours a day and stands on a concrete floor with work boots and is unable to get the proper compression she requires and cannot elevate her limbs appropriately at any given time. She has recently grown Pseudomonas from her wound culture but has not started her ciprofloxacin which was called in for her. 09/13/15 this continues to be a difficult situation for this patient. At one point I had this wound down to a 1.5 x 1.5" wound on her left leg. This is deteriorated and the right leg has reopened. She now has substantial wounds on her medial calcaneus, malleoli and into her lower leg. One on the left has surface eschar but these are far too painful for me to debridement here. She has a vascular surgery appointment next week to see if anything can be done to help here. I think she has had previous ablations several years ago at [facility]. She has no major edema. She tells me that she did  not get product last time [facility]] and went for several days without it. She continues to work in work boots 12 hours a day. She cannot get compression/4-layer under her work boots. 09/20/15 no major change. Periwound edema control was not very good. Her point with pain and vascular is next Wednesday the 25th 09/28/15; the patient is seen vascular surgery and is apparently scheduled for repeat duplex ultrasounds of her bilateral lower legs next week. 10/05/15; the patient was seen by Dr. Doren Custard of vascular surgery. He feels that she should have arterial insufficiency excluded as cause/contributed to her nonhealing stage she is therefore booked for an arteriogram. She has apparently monophasic signals in the dorsalis pedis pulses. She also of course has known severe chronic venous insufficiency with previous procedures as noted previously. I had another long discussion with the patient today about her continuing to work 12 hour shifts. I've written her out for 2 months area had concerns about this as her work location is currently undergoing significant turmoil and this may lead to her termination. She is aware of this however I agree with her that she simply cannot continue to stand for 12 hours multiple days a week with the substantial wound areas she has. 10/19/15; the Dr. Doren Custard appointment was largely for an arteriogram which was normal. She does not have an arterial issue. He didn't make a comment about her chronic venous insufficiency for which she has had previous ablations. Presumably it was not felt that anything additional could be done. The patient is now out of work as I prescribed 2 weeks ago. Her wounds look somewhat less aggravated presumably because of this. I felt I would give debridement another try today 10/25/15; no major change in this patient's wounds. We are struggling to get her product that she can afford into her own home through her insurance. 11/01/15; no major change in the  patient's wounds. I have been using silver alginate as the most affordable product. I spoke to Dr. Marla Roe last week with her requested take her to the OR for surgical debridement and placement of ACEL. Dr. Marla Roe told  me that she would be willing to do this however [employer] will not cover this, fortunately the patient has [place] healthcare of some variant 11/08/15; no major change in the patient's wounds. She has been completely nonviable surface that this but is in too much pain with any attempted debridement are clinic. I have arranged for her to see Dr. Marla Roe ham of plastic surgery and this appointment is on Monday. I am hopeful that they will take her to the OR for debridement, possible ACEL ultimately possible skin graft 11/22/15 no major change in the patient's wounds over her bilateral medial calcaneus medial malleolus into the lower legs. Surface on these does not look too bad however on the left there is surrounding erythema and tenderness. This may be cellulitis or could him sleepy tinea. 11/29/15; no major changes in the patient's wounds over her bilateral medial malleolus. There is no infection here and I don't think any additional antibiotics are necessary. There is now plan to move forward. She sees Dr. Marla Roe in a week's time for preparation for operative debridement and ACEL placement I believe on 7/12. She then has a follow-up appointment with Dr. Marla Roe on 7/21 12/28/15; the patient returns today having been taken to the [facility] by Dr. Marla Roe 12/12/15 she underwent debridement, intraoperative cultures [which were negative]. She had placement of a wound VAC. Parent really ACEL was not available to be placed. The wound VAC foam apparently adhered to the wound since then she's been using silver alginate, Xeroform under Ace wraps. She still says there is a lot of drainage and a lot of pain 01/31/16; this is a patient I see monthly. I had referred her to Dr.  Marla Roe him of plastic surgery for large wounds on her bilateral medial ankles. She has been to the OR twice once in early July and once in early August. She tells me over the last 3 weeks she has been using the wound VAC with ACEL underneath it. On the right we've simply been using silver alginate. Under Kerlix Coban wraps. 02/28/16; this is a patient I'm currently seeing monthly. She is gone on to have a skin graft over her large venous insufficiency ulcer on the left medial ankle. This was done by Dr. Marla Roe him. The patient is a bit perturbed about why she didn't have one on her right medial ankle wound. She has been using silver alginate to this. 03/06/16; I received a phone call from her plastic surgery Dr. Marla Roe. She expressed some concern about the viability of the skin graft she did on the left medial ankle wound. Asked me to place Endoform on this. She told me she is not planning to do a subsequent skin graft on the right as the left one did not take very well. I had placed Hydrofera Blue on the right 03/13/16; continue to have a reasonably healthy wound on the right medial ankle. Down to 3 mm in terms of size. There is epithelialization here. The area on the left medial ankle is her skin graft site. I suppose the last week this looks somewhat better. She has an open area inferiorly however in the center there appears to be some viable tissue. There is a lot of surface callus and eschar that will eventually need to come off however none of this looked to be infected. Patient states that the is able to keep the dressing on for several days which is an improvement. 03/20/16 no major change in the circumference of either  wound however on the left side the patient was at Dr. Eusebio Friendly office and they did a debridement of left wound. 50% of the wound seems to be epithelialized. I been using Endoform on the left Hydrofera Blue in the right 03/27/16; she arrives today with her wound is  not looking as healthy as they did last week. The area on the right clearly has an adherent surface to this a very similar surface on the left. Unfortunately for this patient this is all too familiar problem. Clearly the Endoform is not working and will need to change that today that has some potential to help this surface. She does not tolerate debridement in this clinic very well. She is changing the dressing wants 04/03/16; patient arrives with the wounds looking somewhat better especially on the right. Dr. Migdalia Dk change the dressing to silver alginate when she saw her on Monday and also sold her some compression socks. The usefulness of the latter is really not clear and woman with severely draining wounds. 04/10/16; the patient is doing a bit of an experiment wearing the compression stockings that Dr. Migdalia Dk provided her to her left leg and the out of legs based dressings that we provided to the right. 05/01/16; the patient is continuing to wear compression stockings Dr. Migdalia Dk provided her on the left that are apparently silver impregnated. She has been using Iodoflex to the right leg wound. Still a moderate amount of drainage, when she leaves here the wraps only last for 4 days. She has to change the stocking on the left leg every night 05/15/16; she is now using compression stockings bilaterally provided by Dr. Marla Roe. She is wearing a nonadherent layer over the wounds so really I don't think there is anything specific being done to this now. She has some reduction on the left wound. The right is stable. I think all healing here is being done without a specific dressing 06/09/16; patient arrives here today with not much change in the wound certainly in diameter to large circular wounds over the medial aspect of her ankle bilaterally. Under the light of these services are certainly not viable for healing. There is no evidence of surrounding infection. She is wearing compression stockings with  some sort of silver impregnation as prescribed by Dr. Marla Roe. She has a follow-up with her tomorrow. 06/30/16; no major change in the size or condition of her wounds. These are still probably covered with a nonviable surface. She is using only her purchase stockings. She did see Dr. Marla Roe who seemed to want to apply Dakin's solution to this I'm not extreme short what value this would be. I would suggest Iodoflex which she still has at home. 07/28/16; I follow [name] episodically along with Dr. Marla Roe. 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 She has been applying a topical collagen ointment to both wounds with Adaptic. I don't think Dr. Marla Roe is planning to take her back to the OR. 08/19/16; I follow [name] on a monthly basis along with Dr. Marla Roe of plastic surgery. 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 I been following her for a number of years. At one point I was able to get the right medial malleolus wound to heal and had the left medial malleolus down to about half its current size however and I had to send her to plastic surgery for an operative debridement. Since then  things have been stable to slightly improve the area on the right is slightly better one in the left about the same although there is much less adherent surface than I'm used to with this patient. She is using some form of liquid collagen gel that Dr. Marla Roe provided a Kerlix cover with the patient's own pressure stockings. She tells me that she has extreme pain in both ankles and along the lateral aspect of both feet. She has been unable to work for some period of time. She is telling me she is retiring at the beginning of April. She sees Dr. Doran Durand of orthopedics next week 09/22/16; patient has not seen Dr. Marla Roe since the last time she is here. I'm not  really sure what she is using to the wounds other than bits and pieces of think she had left over including most recently Hydrofera Blue. She is using juxtalite stockings. She is having difficulty with her husband's recent illness "stroke". She is having to transport him to various doctors appointments. Dr. Marla Roe left her the option of a repeat debridement with ACEL however she has not been able to get the time to follow-up on this. She continues to have a fair amount of drainage out of these wounds with certainly precludes leaving dressings on all week 10/13/16; patient has not seen Dr. Marla Roe since she was last in our clinic. I'm not really sure what she is doing with the wounds, we did try to get her [facility] and I think she is actually using this most of the time. Because of drainage she states she has to change this every second day although this is an improvement from what she used to do. She went to see Dr. Doran Durand who did not think she had a muscular issue with regards to her feet, he referred her to a neurologist and I think the appointment is sometime in June. I changed her back to Iodoflex which she has used in the past but not recently. 11/03/16; the patient has been using Iodoflex although she ran out of this. Still claims that there is a lot of drainage although the wound does not look like this. No surrounding erythema. She has not been back to see Dr. Marla Roe 11/24/16; the patient has been using Iodoflex again but she ran out of it 2 or 3 days ago. There is no major change in the condition of either one of these wounds in fact they are larger and covered in a thick adherent surface slough/nonviable tissue especially on the left. She does not tolerate mechanical debridement in our clinic. Going back to see Dr. Marla Roe of plastic surgery for an operative debridement would seem reasonable. 12/15/16; the patient has not been back to see Dr. Marla Roe. She is been dealing  with a series of illnesses and her husband which of monopolized her time. She is been using Sorbact which we largely supplied. She states the drainage is bad enough that it maximum she can go 2-3 days without changing the dressing 01/12/2017 -- the patient has not been back for about 4 weeks and has not seen Dr. Marla Roe not does she have any appointment pending. 01/23/17; patient has not seen Dr. Marla Roe even though I suggested this previously. She is using Santyl that was suggested last week by Dr. Con Memos this Cost her $16 through her insurance which is indeed surprising 02/12/17; continuing Santyl and the patient is changing this daily. A lot of drainage. She has not been back to see plastic  surgery she is using an Ace wrap. Our intake nurse suggested wrap around stockings which would make a good reasonable alternative 02/26/17; patient is been using Santyl and changing this daily due to drainage. She has not been to see plastic surgery she uses in April Ace wrap to control the edema. She did obtain extremitease stockings but stated that the edema in her leg was to big for these 03/20/17; patient is using Santyl and Anasept. Surfaces looked better today the area on the right is actually measuring a little smaller. She has states she has a lot of pain in her feet and ankles and is asking for a consult to pain control which I'll try to help her with through our case manager. 04/10/17; the patient arrives with better-looking wound surfaces and is slightly smaller wound on the left she is using a combination of Santyl and Anasept. She has an appointment or at least as started in the pain control center associated with [facility] 05/14/17; this is a patient who I followed for a prolonged period of time. 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 At one point I had this down to a much smaller wound on the left however these reopened and we've never been able to get these  to heal. She has been using Santyl and Anasept gel although 2 weeks ago she ran out of the Anasept gel. She has a stable appearance of the wound. She is going to the wound care clinic at [facility]. They wanted do a nerve block/spinal block although she tells me she is reluctant to go forward with that. 05/21/17; this is a patient I have followed for many years. 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 Chronic pain and deformity in her ankles as well. She is been to see plastic surgery as well as orthopedics. Using PolyMem AG most recently/Kerramax/ABDs and 2 layer compression. She has managed to keep this on and she is coming in for a nurse check to change the dressing on Tuesdays, we see her on Fridays 06/05/17; really quite a good looking surface and the area especially on the right medial has contracted in terms of dimensions. Well granulated healthy-looking tissue on both sides. Even with an open curet there is nothing that even feels abnormal here. This is as good as I've seen this in quite some time. We have been using PolyMem AG and bringing her in for a nurse check 06/12/17; really quite good surface on both of these wounds. The right medial has contracted a bit left is not. We've been using PolyMem and AG and she is coming in for a nurse visit 06/19/17; we have been using PolyMem AG and bringing her in for a nurse check. Dimensions of her wounds are not better but the surfaces looked better bilaterally. She complained of bleeding last night and the left wound and increasing pain bilaterally. She states her wound pain is more neuropathic than just the wounds. There was some suggestion that this was radicular from her pain management doctor in talking to her it is really difficult to sort this out. 06/26/17; using PolyMem and AG and bringing her in for a nurse check as All of this and reasonably stable condition. Certainly not improved. The dimensions on the  lateral part of the right leg look better but not really measuring better. The medial aspect on the left is about the same. 07/03/16; we have been using PolyMen AG and bringing her in  for a nurse check to change the dressings as the wounds have drainage which precludes once weekly changing. We are using all secondary absorptive dressings.our intake nurse is brought up the idea of using a wound VAC/snap VAC on the wound to help with the drainage to see if this would result in some contraction. This is not a bad idea. The area on the right medial is actually looking smaller. Both wounds have a reasonable-looking surface. There is no evidence of cellulitis. The edema is well controlled 07/10/17; the patient was denied for a snap VAC by her insurance. The major issue with these wounds continues to be drainage. We are using wicked PolyMem AG and she is coming in for a nurse visit to change this. The wounds are stable to slightly improved. The surface looks vibrant and the area on the right certainly has shrunk in size but very slowly 07/17/17; the patient still has large wounds on her bilateral medial malleoli. Surface of both of these wounds looks better. The dimensions seem to come and go but no consistent improvement. There is no epithelialization. We do not have options for advanced treatment products due to insurance issues. They did not approve of the wound VAC to help control the drainage. More recently we've been using PolyMem and AG wicked to allow drainage through. We have been bringing her in for a nurse visit to change this. We do not have a lot of options for wound care products and the home again due to insurance issues 07/24/17; the patient's wound actually looks somewhat better today. No drainage measurements are smaller still healthy-looking surface. We used silver collagen under PolyMen started last week. We have been bringing her in for a dressing change 07/31/17; patient's wound surface  continued to look better and I think there is visible change in the dimensions of the wound on the right. Rims of epithelialization. We have been using silver collagen under PolyMen and bringing her in for a dressing change. There appears to be less drainage although she is still in need of the dressing change 08/07/17. Patient's wound surface continues to look better on both sides and the area on the right is definitely smaller. We have been using silver collagen and PolyMen. She feels that the drainage has been it has been better. I asked her about her vascular status. She went to see Dr. Aleda Grana at [facility] and had some form of ablation. I don't have much detail on this. I haven't my notes from 2016 that she was not a candidate for any further ablation but I don't have any more information on this. We had referred her to vein and vascular I don't think she ever went. He does not have a history of PAD although I don't have any information on this either. We don't even have ABIs in our record 08/14/17; we've been using silver collagen and PolyMen cover. And putting the patient and compression. She we are bringing her in as a nurse visit to change this because ofarge amount of drainage. We didn't the ABIs in clinic today since they had been done in many moons 1.2 bilaterally. She has been to see vein and vascular however this was at [facility] and she had ablation although I really don't have any information on this all seemed biking get a report. She is also been operatively debrided by plastic surgery and had a cell placed probably 8-12 months ago. This didn't have a major effect. We've been making some gains  with current dressings 08/19/17-She is here in follow-up evaluation for bilateral medial malleoli ulcers. She continues to tolerate debridement very poorly. We will continue with recently changed topical treatment; if no significant improvement may consider switching to  Iodosorb/Iodoflex. 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 She has been using silver collagen and PolyMem. I believe she has been used and tried on Iodoflex before. 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 These have been very refractory over time. We have been using silver collagen and PolyMen. She is coming in once a week for a doctor's and once a week for nurse visits. We are actually making some progress 09/18/17; the patient's wounds are smaller especially on the right medial. She arrives today to upset to consider even washing these off with Anasept which I think is been part of the reason this is been closing. We've been using collagen covered in PolyMen otherwise. It is noted that she has a small area of folliculitis on the right medial calf that. As we are wrapping her legs I'll give her a short course of doxycycline to make sure this doesn't amount to anything. She is a long list of complaints today including imbalance, shortness of breath on exertion, inversion of her left ankle. With regards to the latter complaints she is been to see orthopedics and they offered her a tendon release surgery I believe but wanted her wounds to be closed first. I have recommended she go see her primary physician with regards to everything else. 09/25/17; patient's wounds are about the same size. We have made some progress bilaterally although not in recent weeks. She will not allow me T wash these wounds with Anasept even if she is doing her cell. Wheeze we've been using collagen covered in PolyMen. Last week she had a small area of folliculitis this is now opened into a small wound. She completed 5 days of trimethoprim sulfamethoxazole 10/02/17; unfortunately the area  on her left medial ankle is worse with a larger wound area towards the Achilles. The patient complains of a lot of pain. She will not allow debridement although visually I don't think there is anything to debridement in any case. We have been using silver collagen and PolyMen for several months now. Initially we are making some progress although I'm not really seeing that today. We will move back to [facility]. His admittedly this is a bit of a repeat however I'm hoping that his situation is different now. The patient tells me she had her leg on the left give out on her yesterday this is process some pain. 10/09/17; the patient is seen twice a week largely because of drainage issues coming out of the chronic medial bimalleolar wounds that are chronic. Last week the dimensions of the one on the left looks a little larger I changed her to [facility]. She comes in today with a history of terrible pain in the bilateral wound areas. She will not allow debridement. She will not even allow a tissue culture. There is no surrounding erythema no no evidence of cellulitis. We have been putting her Kerlix Coban man. She will not allow more aggressive compression as there was a suggestion to put her in 3 layer wraps. 10/16/17; large wounds on her bilateral medial malleoli. These  are chronic. Not much change from last week. The surface looks have healthy but absolutely no epithelialization. A lot of pain little less so of drainage. She will not allow debridement or even washing these off in the vigorous fashion with Anasept. 10/23/17; large wounds on her bilateral malleoli which are chronic. Some improvement in terms of size perhaps on the right since last time I saw these. She states that after we increased the 3 layer compression there was some bleeding, when she came in for a nurse visit she did not want 3 layer compression put back on about our nurse managed to convince her. She has known chronic venous  visit issues and I'm hoping to get her to tolerate the 3 layer compression. using Hydrofera Blue 10/30/17; absolutely no change in the condition of either wound although we've had some improvement in dimensions on the right.. Attempted to put her in 3 layer compression she didn't tolerated she is back in 2 layer compression. We've been using Hydrofera Blue We looked over her past records. She had venous reflux studies in November 2016. There was no evidence of deep venous reflux on the right. Superficial vein did not show the greater saphenous vein at think this is been previously ablated the small saphenous vein was within normal limits. The left deep venous system showed no DVT the vessels were positive for deep venous reflux in the posterior tibial veins at the ankle. The greater saphenous vein was surgically absent small saphenous vein was within normal limits. She went to vein and vascular at [facility]. I believe she had an ablation on the left greater saphenous vein. I'll update her reflux studies perhaps ever reviewed by vein and vascular. We've made absolutely no progress in these wounds. Will also try to read and TheraSkins through her insurance 11/06/17; W the patient apparently has a 2 week follow-up with vein and vascular I like him to review the whole issue with regards to her previous vascular workup by Dr. Aleda Grana. We've really made no progress on these wounds in many months. She arrives today with less viable looking surface on the left medial ankle wound. This was apparently looking about the same on Tuesday when she was here for nurse visit. 11/13/17; deep tissue culture I did last time of the left lower leg showed multiple organisms without any predominating. In particular no Staphylococcus or group A strep were isolated. We sent her for venous reflux studies. She's had a previous left greater saphenous vein stripping and I think sclerotherapy of the right greater saphenous  vein. She didn't really look at the lesser saphenous vein this both wounds are on the medial aspect. She has reflux in the common femoral vein and popliteal vein and an accessory vein on the right and the common femoral vein and popliteal vein on the left. I'm going to have her go to see vein and vascular just the look over things and see if anything else beside aggressive compression is indicated here. We have not been able to make any progress on these wounds in spite of the fact that the surface of the wounds is never look too bad. 11/20/17; no major change in the condition of the wounds. Patient reports a large amount of drainage. She has a lot of complaints of pain although enlisting her today I wonder if some of this at least his neuropathic rather than secondary to her wounds. She has an appointment with vein and vascular on 12/30/17. The refractory nature of  these wounds in my mind at least need vein and vascular to look over the wounds the recent reflux studies we did and her history to see if anything further can be done here. I also note her gait is deteriorated quite a bit. 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 She has a bilateral Trendelenburg gait. I wonder if this is neuropathic or perhaps multilevel radicular. 11/27/17; her wounds actually looks slightly better. Healthy-looking granulation tissue a scant amount of epithelialization. [place] healthcare will not pay for [employer]. They will play for tri layer Oasis and Dermagraft. This is not a diabetic ulcer. We'll try for the tri layer Oasis. She still complains of some drainage. She has a vein and vascular appointment on 12/30/17 12/04/17; the wounds visually look quite good. Healthy-looking granulation with some degree of epithelialization. We are still waiting for response to our request for trial to try layer Oasis. Her appointment with vascular to review venous and arterial issues isn't sold the end of July 7/31. Not  allow debridement or even vigorous cleansing of the wound surface. 12/18/17; slightly smaller especially on the right. 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 We've been using Hydrofera Blue. We still are looking into triple layer Oasis through her insurance 01/08/18 on evaluation today patient's wound actually appears to be showing signs of good improvement at this point in time. She has been tolerating the dressing changes without complication. Fortunately there does not appear to be any evidence of infection at this point in time. We have been utilizing silver nitrate which does seem to be of benefit for her which is also good news. Overall I'm very happy with how things seem to be both regards appearance as well as measurement. Patient did see Dr. Bridgett Larsson for evaluation on 12/30/17. In his assessment he felt that stripping would not likely add much more than chronic compression to the patient's healing process. His recommendation was to follow-up in three months with Dr. Doren Custard if she hasn't healed in order to consider referral back to you and see vascular where she previously was in a trial and was able to get her wound to heal. I'll be see what she feels she when you staying compression and he reiterated this as well. 01/13/18 on evaluation today patient appears to actually be doing very well in regard to her bilateral medial malleolus ulcers. She seems to have tolerated the chemical cauterization with silver nitrate last week she did have some pain through that evening but fortunately states that I'll be see since it seems to be doing better she is overall pleased with the progress. 01/21/18; really quite a remarkable improvement since I've last seen these wounds. We started using silver nitrate specially on the islands of hyper granulation which for some reason her around the wound circumference. This is really done quite nicely. Primary dressing Hydrofera Blue under 4  layer compression. She seems to be able to hold out without a nurse rewrap. Follow-up in 1 week 01/28/18; we've continued the hydrofera blue but continued with chemical cauterization to the wound area that we started about a month ago for irregular hyper granulation. She is made almost stunning improvement in the overall wound dimensions. I was not really expecting this degree of improvement in these chronic wounds 02/05/18; we continue with Hydrofera Bluebut of also continued the aggressive chemical cauterization with silver nitrate. We made nice progress with the right greater than left wound. 02/12/18. We continued with Hydrofera Blue after  aggressive chemical cauterization with silver nitrate. We appear to be making nice progress with both wound areas 02/19/2018; we continue with [facility] after washing the wounds vigorously with Anasept spray and chemical cauterization with silver nitrate. We are making excellent progress. The area on the right's just about closed 02/26/2018. The area on the left medial ankle had too much necrotic debris today. I used a #5 curette we are able to get most of the soft. I continued with the silver nitrate to the much smaller wound on the right medial ankle she had a new area on her right lower pretibial area which she says was due to a role in her compression 03/05/2018; both wound areas look healthy. Not much change in dimensions from last week. I continue to use silver nitrate and Hydrofera Blue. The patient saw Dr. Doren Custard of vein and vascular. He felt she had venous stasis ulcers. He felt based on her previous arteriogram she should have adequate circulation for healing. Also she has deep venous reflux but really no significant correctable superficial venous reflux at this time. He felt we should continue with conservative management including leg elevation and compression 04/02/2018; since we last saw this woman about a month ago she had a fall apparently  suffered a pelvic fracture. I did not look up the x-ray. Nevertheless because of pain she literally was bedbound for 2 weeks and had home health coming out to change the dressing. Somewhat predictably this is resulted in considerable improvement in both wound areas. The right is just about closed on the medial malleolus and the left is about half the size. 04/16/2018; both her wounds continue to go down in size. Using Hydrofera Blue. 05/07/18; both her wounds appeared to be improving especially on the right where it is almost closed. We are using Hydrofera Blue 05/14/2018; slightly worse this week with larger wounds. Surface on the left medial not quite as good. We have been using Hydrofera Blue 05/21/18; again the wounds are slightly larger. Left medial malleolus slightly larger with eschar around the circumference. We have been using Hydrofera Blue undergoing a wraps for a prolonged period of time. This got a lot better when she was more recumbent due to a fall and a back injury. I change the primary dressing the silver alginate today. She did not tolerate a 4 layer compression previously although I may need to bring this up with her next time 05/28/2018; area on the left medial malleolus again is slightly larger with more drainage. Area on the right is roughly unchanged. She has a small area of folliculitis on the right medial just on the lower calf. This does not look ominous. 06/03/2018 left medial malleolus slightly smaller in a better looking surface. We used silver nitrate on this last time with silver alginate. The area on the right appears slightly smaller 1/10; left medial malleolus slightly smaller. Small open area on the right. We used silver nitrate and silver alginate as of 2 weeks ago. We continue with the wound and compression. These got a lot better when she was off her feet 1/17; right medial malleolus wound is smaller. The left may be slightly smaller. Both surfaces look somewhat  better. 1/24; both wounds are slightly smaller. Using silver alginate under Unna boots 1/31; both wounds appear smaller in fact the area on the right medial is just about closed. Surface eschar. We have been using silver alginate under Unna boots. The patient is less active now spends let much less time  on her feet and I think this is contributed to the general improvement in the wound condition 2/7; both wounds appear smaller. I was hopeful the right medial would be closed however there there is still the same small open area. Slight amount of surface eschar on the left the dimensions are smaller there is eschar but the wound edges appear to be free. We have been using silver alginate under Unna boot's 2/14; both wounds once again measure smaller. Circumferential eschar on the left medial. We have been using silver alginate under Unna boots with gradual improvement 2/21; the area on the right medial malleolus has healed. The area on the left is smaller. We have been using silver alginate and Unna boots. We can discharge wrapping the right leg she has 20/30 stockings at home she will need to protect the scar tissue in this area 2/28; the area on the right medial malleolus remains closed the patient has a compression stocking. The area on the left is smaller. We have been using silver alginate and Unna boots. 3/6 the area on the right medial ankle remains closed. Good edema control noted she is using her own compression stocking. The area on the left medial ankle is smaller. We have been managing this with silver alginate and Unna boots which we will continue today. 3/13; the area on the right medial ankle remains closed and I'm declaring it healed today. When necessary the left is about the same still a healthy-looking surface but no major change and wound area. No evidence of infection and using silver alginate under unna and generally making considerable improvement 3/27 the area on the right  medial ankle remains closed the area on the left is about the same as last week. Certainly not any worse we have been using silver alginate under an Unna boot 4/3; the area on the right medial ankle remains closed per the patient. We did not look at this wound. The wound on the left medial ankle is about the same surface looks healthy we have been using silver alginate under an Unna boot 4/10; area on the right medial ankle remains closed per the patient. We did not look at this wound. The wound on the left medial ankle is slightly larger. The patient complains that the [facility] caused burning pain all week. She also told us that she was a lot more active this week. Changed her back to silver alginate 4/17; right medial ankle still closed per the patient. Left medial ankle is slightly larger. Using silver alginate. She did not tolerate Hydrofera Blue on this area 4/24; right medial ankle remains closed we have not look at this. The left medial ankle continues to get larger today by about a centimeter. We have been using silver alginate under Unna boots. She complains about 4 layer compression as an alternative. She has been up on her feet working on her garden 5/8; right medial ankle remains closed we did not look at this. The left medial ankle has increased in size about 100%. We have been using silver alginate under Unna boots. She noted increased pain this week and was not surprised that the wound is deteriorated 5/15; no major change in SA however much less erythema ( one week of doxy ocellulitis). 5/22-65 year old female returns at 1 week to the clinic for left medial ankle wound for which we have been using silver alginate under 3 layer compression She was placed on DOXY at last visit - the wound is wider at  this visit. She is in 3 layer compression 5/29; change to [facility] last week. I had given her empiric doxycycline 2 weeks ago for a week. She is in 3 layer compression. She  complains of a lot of pain and drainage on presentation today. 6/5; using Hydrofera Blue. I gave her doxycycline recently empirically for erythema and pain around the wound. Believe her cultures showed enterococcus which not would not have been well covered by doxycycline nevertheless the wound looks better and I don't feel specifically that the enterococcus needs to be covered. She has a new what looks like a wrap injury on her lateral left ankle. 6/12; she is using Hydrofera Blue. She has a new area on the left anterior lower tibial area. This was a wrap injury last week. 6/19; the patient is using Hydrofera Blue. She arrived with marked inflammation and erythema around the wound and tenderness. 12/01/18 on evaluation today patient appears to be doing a little bit better based on what I'm hearing from the standpoint of lassos evaluation to this as far as the overall appearance of the wound is concerned. Then sometime substandard she typically sees Dr. Dellia Nims. Nonetheless overall very pleased with the progress that she's made up to this point. No fevers, chills, nausea, or vomiting noted at this time. 7/10; some improvement in the surface area. Aggressively debrided last week apparently. I went ahead with the debridement today although the patient does not tolerate this very well. We have been using Iodoflex. Still a fair amount of drainage 7/17; slightly smaller. Using Iodoflex. 7/24; no change from last week in terms of surface area. We have been using Iodoflex. Surface looks and continues to look somewhat better 7/31; surface area slightly smaller better looking surface. We have been using Iodoflex. This is under Unna boot compression 8/7-Patient presents at 1 week with Unna boot and Iodoflex, wound appears better 8/14-Patient presents at 1 week with Iodoflex, we use the Unna boot, wound appears to be stable better.Patient is getting Botox treatment for the inversion of the foot for tendon  release, Next week 8/21; we are using Iodoflex. Unna boot. The wound is stable in terms of surface area. Under illumination there is some areas of the wound that appear to be either epithelialized or perhaps this is adherent slough at this point I was not really clear. It did not wipe off and I was reluctant to debride this today. 8/28; we are using Iodoflex in an Unna boot. Seems to be making good improvement. 9/4; using Iodoflex and wound is slightly smaller. 9/18; we are using Iodoflex with topical silver nitrate when she is here. The wound continues to be smaller 10/2; patient missed her appointment last week due to GI issues. She left and Iodoflex based dressing on for 2 weeks. Wound is about the same size about the size of a dime on the left medial lower 10/9 we have been using Iodoflex on the medial left ankle wound. She has a new superficial probable wrap injury on the dorsal left ankle 10/16; we have been using Hydrofera Blue since last week. This is on the left medial ankle 10/23; we have been using Hydrofera Blue since 2 weeks ago. This is on the left medial ankle. Dimensions are better 11/6; using Hydrofera Blue. I think the wound is smaller but still not closed. Left medial ankle 11/13; we have been using Hydrofera Blue. Wound is certainly no smaller this week. Also the surface not as good. This is the remanent of  a very large area on her left medial ankle. 11/20; using Sorbact since last week. Wound was about the same in terms of size although I was disappointed about the surface debris 12/11; 3-week follow-up. Patient was on vacation. Wound is measuring slightly larger we have been using Sorbact. 12/18; wound is about the same size however surface looks better last week after debridement. We have been using Sorbact under compression 1/15 wound is probably twice the size of last time increased in length nonviable surface. We have been using Sorbact. She was running a mild fever  and missed her appointment last week 1/22; the wound is come down in size but under illumination still a very adherent debris we have been Hydrofera Blue that I changed her to last week 1/29; dimensions down slightly. We have been using Hydrofera Blue 2/19 dimensions are the same however there is rims of epithelialization under illumination. Therefore more the surface area may be epithelialized 2/26; the patient's wound actually measures smaller. The wound looks healthy. We have been using Hydrofera Blue. I had some thoughts about running Apligraf then I still may do that however this looks so much better this week we will delay that for now 3/5; the wound is small but about the same as last week. We have been using Hydrofera Blue. No debridement is required today. 3/19; the wound is about the size of a dime. Healthy looking wound even under illumination. We have been using Hydrofera Blue. No mechanical debridement is necessary 3/26; not much change from last week although still looks very healthy. We have been using Hydrofera Blue under Unna boots Patient was offered an ankle fusion by podiatry but not until the wound heals with a proceed with this. 4/9; the patient comes in today with her original wound on the medial ankle looking satisfactory however she has some uncontrolled swelling in the middle part of her leg with 2 new open areas superiorly just lateral to the tibia. I think this was probably a wrap issue. She said she felt uncomfortable during the week but did not call in. We have been using Hydrofera Blue 4/16; the wound on the medial ankle is about the same. She has innumerable small areas superior to this across her mid tibia. I think this is probably folliculitis. She is also been working in the yard doing a lot of sweating 4/30; the patient issue on the upper areas across her mid tibia of all healed. I think this was excessive yard work if I remember. Her wound on the medial ankle is  smaller. Some debris on this we have been using Hydrofera Blue under Unna boots 5/7; mid tibia. She has been using Hydrofera Blue under an Unna wrap. She is apparently going for her ankle surgery on June 3 10/28/19-Patient returns to clinic with the ankle wound, we are using Hydrofera Blue under Unna wrap, surgery is scheduled for her left foot for June 3 so she will be back for nurse visit next week READMISSION 01/17/2020 [name] is a 65 year old woman we have had in this clinic for a long period of time with severe venous hypertension and refractory wounds on her medial lower legs and ankles bilaterally. 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 When she was off her legs for a prolonged period example when she fell and suffered a compression fracture things would  heal up quite nicely. She is now retired and we managed to heal up the right medial leg wound. The left one was very tiny last time I saw this although still refractory. 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 It got to the point where this was interfering with her walking and she elected to proceed with a ankle arthrodesis to straighten her her ankle and leave her with a functional outcome for mobilization. The patient was referred to Dr. Doren Custard and really this took some time to arrange. Dr. Doren Custard saw her on 12/07/2019. Once again he verified that she had no arterial issues. She had previously had an angiogram several years ago. Follow-up ABIs on the left showed an ABI of 1.12 with triphasic waveforms and a TBI of 0.92. She is felt to have chronic deep venous insufficiency but I do not think it was felt that anything could be done from about this from an ablation point of view. At the time Dr. Doren Custard saw this patient the wounds  actually look closed via the pictures in his clinic. The patient finally underwent her surgery on 12/15/2019. This went reasonably well and there was a good anatomic outcome. She developed a small distal wound dehiscence on the lateral part of the surgical wound. However more problematically she is developed recurrence of the wound on the medial left ankle. There are actually 2 wounds here one in the distal lower leg and 1 pretty much at the level of the medial malleolus. It is a more distal area that is more problematic. She has been using Hydrofera Blue which started on Friday before this she was simply Ace wrapping. There was a culture done that showed Pseudomonas and she is on ciprofloxacin. A recent CNS on 8/11 was negative. The patient reports some pain but I generally think this is improving. She is using a cam boot completely nonweightbearing using a walker for pivot transfers and a wheelchair 8/24; not much improvement unfortunately she has a surgical wound on the lateral part in the venous insufficiency wound medially. The bottom part of the medial insufficiency wound is still necrotic there is exposed tendon here. We have been using Hydrofera Blue under compression. Her edema control is however better 8/31; patient in for follow-up of his surgical wound on the lateral part of her left leg and chronic venous insufficiency ulcers medially. We put her back in compression last week. She comes in today with a complaint of 3 or 4 days worth of increasing pain. She felt her cam walker was rubbing on the area on the back of her heel. However there is intense erythema seems more likely she has cellulitis. She had 2 cultures done when she was seeing podiatry in the postop. One of them in late July showed Pseudomonas and she received a course of ciprofloxacin the other was negative on 8/11 she is allergic to penicillin with anaphylactoid complaints of hives oral swelling via information in epic 9/9;  when I saw this patient last week she had intense anterior erythema around her wound on the right lateral heel and ankle and also into the right medial heel. Some of this was no doubt drainage and her walker boot however I was convinced she had cellulitis. I gave her Levaquin and Bactrim she is finishing up on this now. She is following up with Dr. [name] he saw her yesterday. He is taken her out of the  walking boot of course she is still nonweightbearing. Her x-ray was negative for any worrisome features such as soft tissue air etc. Things are a lot better this week. She has home health. We have been using Hydrofera Blue under an [employer] which she put back on yesterday. I did not wrap her last week 9/17; her surrounding skin looks a lot better. In fact the area on the left lateral ankle has just a scant amount of eschar. The only remaining wound is the large area on the left medial ankle. Probably about 60% of this is healthy granulation at the surface however she has a significant divot distally. This has adherent debris in it. I been using debridement and silver collagen to try and get this area to fill-in although I do not think we have made much progress this week 9/24; the patient's wound on the left medial ankle looks a lot better. The deeper divot area distally still requires debridement but this is cleaning up quite nicely we have been using silver collagen. The patient is complaining of swelling in her foot and is worried that that is contributing to the nonhealing of the ankle wound. She is also complaining of numbness in her anterior toes 10/4; left medial ankle. The small area distally still has a divot with necrotic material that I have been debriding away. This has an undermining area. She is approved for Apligraf. She saw Dr. [name] her surgeon on 10/1. I think he declared himself is satisfied with the condition of things. Still nonweightbearing till the end of the month. We are  dealing with the venous insufficiency wounds on the medial ankle. Her surgical wound is well closed. There is no evidence of infection 10/11; the patient arrived in clinic today with the expectation that we be able to put an Apligraf on this area after debridement however she arrives with a relatively large amount of green drainage on the dressing. The patient states that this started on Friday. She has not been systemically unwell. 10/19; culture I did last week showed both Enterococcus and Pseudomonas. I think this came in separate parts because I stopped her ciprofloxacin I gave her and prescribed her linezolid however now looking at the final culture result this is Pseudomonas which is resistant to quinolones. She has not yet picked up the linezolid apparently phone issues. We are also trying to get a topical antibiotic out of [place] in [place] they can be applied by home health. She is still having green drainage 10/16; the patient has her topical antibiotic from [facility] in [place]. This is a compounded gel with vancomycin and ciprofloxacin and gentamicin. We are applying this on the wound bed with silver alginate over the top with Unna boot wraps. She arrives in clinic today with a lot less ominous looking drainage although she is only use this topical preparation once the second time today. She sees Dr. [name] her surgeon on Friday she has home health changing the dressing 11/2; still using her compounded topical antibiotic under silver alginate. Surface is cleaning up there is less drainage. We had an Apligraf for her today and I elected to apply it. A light coating of her antibiotic 04/25/2020 upon evaluation today patient appears to be doing well with regard to her ankle ulcer. There is a little bit of slough noted on the surface of the wound I am can have to perform sharp debridement to clear this away today. With that being said other than that fact  overall I feel like  she is making progress and we do see some new epithelial growth. There is also some improvement in the depth of the wound and that distal portion. There is little bit of slough there as well. 12/7; 2-week follow-up. Apligraf #3. Dimensions are smaller. Closing in especially inferiorly. Still some surface debris. Still using the [facility] topical antibiotic but I told her that I don't think this needs to be renewed 12/21; 2-week follow-up. Apligraf #4 dimensions are smaller. Nice improvement 06/05/2020; 2-week follow-up. The patient's wound on the left medial ankle looks really excellent. Nice granulation. Advancing epithelialization no undermining no evidence of infection. We would have to reapply for another Apligraf but with the condition of this wound I did not feel strongly about it. We used Hydrofera Blue under the same degree of compression. She follows up with Dr. [name] her surgeon a week Friday 06/13/2020 upon evaluation today patient appears to be doing excellent in regard to her wound. She has been tolerating the dressing changes without complication. Fortunately there is no signs of active infection at this time. No fevers, chills, nausea, vomiting, or diarrhea. She was using Hydrofera Blue last week. 06/20/2020 06/20/2020 on evaluation today patient actually appears to be doing excellent in regard to her wound. This is measuring better and looking much better as well. She has been using the collagen that seems to be doing better for her as well even though the [facility] was and is not sticking or feeling as rough on her wound. She did see Dr. [name] on Friday he is very pleased he also stated none of the hardware has shifted. That is great news 1/27; the patient has a small clean wound all that is remaining. I agree that this is too small to really consider an Apligraf. Under illumination the surface is looking quite good. We have been using collagen although the dimensions are not any  better this week 2/2; the patient has a small clean wound on the left medial ankle. Although this left of her substantial original areas. Measurements are smaller. We have been using polymen Ag under an Haematologist. 2/10; small area on the left medial ankle. This looks clean nothing to debride however dimensions are about the same we have been using polymen I think now for 2 weeks 2/17; not much change in surface area. We have been using polymen Ag without any improvement Objective Constitutional Patient is hypertensive.. Pulse regular and within target range for patient.Marland Kitchen Respirations regular, non-labored and within target range.. Temperature is normal and within the target range for the patient.Marland Kitchen Appears in no distress. Vitals Time Taken: 10:46 AM, Height: 68 in, Temperature: 98.0 F, Pulse: 65 bpm, Respiratory Rate: 16 breaths/min, Blood Pressure: 152/88 mmHg. General Notes: Wound exam; light debridement of surface slough. Surface of the wound looks complete lately viable. I cannot understand why this will not epithelialize there is no uncontrolled edema and no evidence of infection Integumentary (Hair, Skin) Wound #15 status is Open. Original cause of wound was Gradually Appeared. The wound is located on the Left,Medial Malleolus. The wound measures 1.3cm length x 0.7cm width x 0.1cm depth; 0.715cm^2 area and 0.071cm^3 volume. There is Fat Layer (Subcutaneous Tissue) exposed. There is no tunneling or undermining noted. There is a medium amount of serosanguineous drainage noted. The wound margin is flat and intact. There is large (67-100%) red granulation within the wound bed. There is a small (1-33%) amount of necrotic tissue within the wound bed including Adherent  Slough. Assessment Active Problems ICD-10 Chronic venous hypertension (idiopathic) with ulcer and inflammation of left lower extremity Non-pressure chronic ulcer of other part of left lower leg with other specified  severity Non-pressure chronic ulcer of left ankle with other specified severity Procedures Wound #15 Pre-procedure diagnosis of Wound #15 is a Venous Leg Ulcer located on the Left,Medial Malleolus .Severity of Tissue Pre Debridement is: Fat layer exposed. There was a Selective/Open Wound Skin/Dermis Debridement with a total area of 0.51 sq cm performed by [name]., MD. With the following instrument(s): Curette to remove Viable and Non-Viable tissue/material. Material removed includes Slough, Skin: Dermis, and Fibrin/Exudate after achieving pain control using Lidocaine 4% Topical Solution. A time out was conducted at 11:10, prior to the start of the procedure. A Minimum amount of bleeding was controlled with Pressure. The procedure was tolerated well with a pain level of 0 throughout and a pain level of 0 following the procedure. Post Debridement Measurements: 1.7cm length x 0.3cm width x 0.1cm depth; 0.04cm^3 volume. Character of Wound/Ulcer Post Debridement is improved. Severity of Tissue Post Debridement is: Fat layer exposed. Post procedure Diagnosis Wound #15: Same as Pre-Procedure Pre-procedure diagnosis of Wound #15 is a Venous Leg Ulcer located on the Left,Medial Malleolus . There was a Haematologist Compression Therapy Procedure by Baruch Gouty, RN. Post procedure Diagnosis Wound #15: Same as Pre-Procedure Plan Follow-up Appointments: Return Appointment in 2 weeks. Bathing/ Shower/ Hygiene: May shower with protection but do not get wound dressing(s) wet. Edema Control - Lymphedema / SCD / Other: Elevate legs to the level of the heart or above for 30 minutes daily and/or when sitting, a frequency of: Avoid standing for long periods of time. Exercise regularly Compression stocking or Garment 20-30 mm/Hg pressure to: - right leg daily WOUND #15: - Malleolus Wound Laterality: Left, Medial Cleanser: Soap and Water 1 x Per Week/15 Days Discharge Instructions: May shower and wash  wound with dial antibacterial soap and water prior to dressing change. Peri-Wound Care: Sween Lotion (Moisturizing lotion) 1 x Per Week/15 Days Discharge Instructions: Apply moisturizing lotion as directed Prim Dressing: Endoform 2x2 in 1 x Per Week/15 Days ary Discharge Instructions: Moisten with saline Secondary Dressing: Woven Gauze Sponge, Non-Sterile 4x4 in (Home Health) 1 x Per Week/15 Days Discharge Instructions: Apply over primary dressing as directed. Secondary Dressing: ABD Pad, 5x9 (Home Health) 1 x Per Week/15 Days Discharge Instructions: Apply over primary dressing as directed. Com pression Wrap: Unnaboot w/Calamine, 4x10 (in/yd) (Home Health) 1 x Per Week/15 Days Discharge Instructions: Apply Unnaboot as directed. 1. I change the primary dressing to endoform still under her Unna boot compression 2. Not sure why this will not close. She did well with the Apligraf but the wound became too small to really consider a reapplication. Electronic Signature(s) Signed: 07/19/2020 5:29:55 PM By: Linton Ham MD Entered By: Linton Ham on 07/19/2020 11:25:55 -------------------------------------------------------------------------------- SuperBill Details Patient Name: Date of Service: [name], [name] 07/19/2020 Medical Record Number: [id number] Patient Account Number: [id number] Date of Birth/Sex: Treating RN: [birth year] (65 y.o. [name], [name] Primary Care Provider: [name] Other Clinician: Referring Provider: Treating Provider/Extender: [name] in Treatment: 26 Diagnosis Coding ICD-10 Codes Code Description [phone] Chronic venous hypertension (idiopathic) with ulcer and inflammation of left lower extremity L97.828 Non-pressure chronic ulcer of other part of left lower leg with other specified severity L97.328 Non-pressure chronic ulcer of left ankle with other specified severity Facility Procedures CPT4 Code: [id number] Description:  [phone] -  DEBRIDE WOUND 1ST 20 SQ CM OR < ICD-10 Diagnosis Description L97.328 Non-pressure chronic ulcer of left ankle with other specified severity Modifier: Quantity: 1 Physician Procedures : CPT4 Code Description Modifier [id number] - WC PHYS DEBR WO ANESTH 20 SQ CM ICD-10 Diagnosis Description L97.328 Non-pressure chronic ulcer of left ankle with other specified severity Quantity: 1 Electronic Signature(s) Signed: 07/19/2020 5:29:55 PM By: Linton Ham MD Entered By: Linton Ham on 07/19/2020 11:27:03

## 2020-07-20 NOTE — Progress Notes (Signed)
[name], [name] ([id number]) Visit Report for 07/19/2020 Arrival Information Details Patient Name: Date of Service: [place] [name], [name] 07/19/2020 10:30 A M Medical Record Number: [id number] Patient Account Number: [id number] Date of Birth/Sex: Treating RN: April 01, 1956 (65 y.o. [name] Primary Care Marlaine Arey: [name] Other Clinician: Referring Briza Bark: Treating Abbygayle Helfand/Extender: [name] in Treatment: 26 Visit Information History Since Last Visit Added or deleted any medications: No Patient Arrived: Cane Any new allergies or adverse reactions: No Arrival Time: 10:45 Had a fall or experienced change in No Accompanied By: alone activities of daily living that may affect Transfer Assistance: None risk of falls: Patient Identification Verified: Yes Signs or symptoms of abuse/neglect since last No Secondary Verification Process Completed: Yes visito Patient Requires Transmission-Based Precautions: No Hospitalized since last visit: No Patient Has Alerts: Yes Implantable device outside of the clinic No Patient Alerts: L ABI =1.12, TBI = .92 excluding R ABI= 1.02, TBI= .58 cellular tissue based products placed in the center since last visit: Has Dressing in Place as Prescribed: Yes Has Compression in Place as Prescribed: Yes Has Footwear/Offloading in Place as Yes Prescribed: Left: Removable Cast Walker/Walking Boot Pain Present Now: No Electronic Signature(s) Signed: 07/20/2020 4:25:56 PM By: Levan Hurst RN, BSN Entered By: Levan Hurst on 07/19/2020 10:46:24 -------------------------------------------------------------------------------- Compression Therapy Details Patient Name: Date of Service: [name], [name] 07/19/2020 10:30 A M Medical Record Number: [id number] Patient Account Number: [id number] Date of Birth/Sex: Treating RN: 06/07/1955 (65 y.o. [name] Primary Care Amyre Segundo: [name] Other  Clinician: Referring Tarl Cephas: Treating Lassie Demorest/Extender: [name] in Treatment: 26 Compression Therapy Performed for Wound Assessment: Wound #15 Left,Medial Malleolus Performed By: Clinician Baruch Gouty, RN Compression Type: Rolena Infante Post Procedure Diagnosis Same as Pre-procedure Electronic Signature(s) Signed: 07/19/2020 5:51:10 PM By: Deon Pilling Entered By: Deon Pilling on 07/19/2020 11:10:31 -------------------------------------------------------------------------------- Encounter Discharge Information Details Patient Name: Date of Service: [facility] [name], [name] 07/19/2020 10:30 A M Medical Record Number: [id number] Patient Account Number: [id number] Date of Birth/Sex: Treating RN: January 21, 1956 (65 y.o. [name] Primary Care Shauntelle Jamerson: [name] Other Clinician: Referring Atiya Yera: Treating Amri Lien/Extender: [name] in Treatment: 26 Encounter Discharge Information Items Post Procedure Vitals Discharge Condition: Stable Temperature (F): 98 Ambulatory Status: Cane Pulse (bpm): 65 Discharge Destination: Home Respiratory Rate (breaths/min): 18 Transportation: Private Auto Blood Pressure (mmHg): 152/88 Accompanied By: self Schedule Follow-up Appointment: Yes Clinical Summary of Care: Patient Declined Electronic Signature(s) Signed: 07/19/2020 6:31:13 PM By: Baruch Gouty RN, BSN Entered By: Baruch Gouty on 07/19/2020 11:27:54 -------------------------------------------------------------------------------- Lower Extremity Assessment Details Patient Name: Date of Service: [facility] [name], [name] 07/19/2020 10:30 A M Medical Record Number: [id number] Patient Account Number: [id number] Date of Birth/Sex: Treating RN: Jul 12, 1955 (65 y.o. [name] Primary Care Maron Stanzione: [name] Other Clinician: Referring [name]: Treating Tomisha Reppucci/Extender: [name] in  Treatment: 26 Edema Assessment Assessed: [Left: No] [Right: No] Edema: [Left: N] [Right: o] Calf Left: Right: Point of Measurement: From Medial Instep 27.5 cm Ankle Left: Right: Point of Measurement: From Medial Instep 21 cm Vascular Assessment Pulses: Dorsalis Pedis Palpable: [Left:Yes] Electronic Signature(s) Signed: 07/20/2020 4:25:56 PM By: Levan Hurst RN, BSN Entered By: Levan Hurst on 07/19/2020 10:59:02 -------------------------------------------------------------------------------- Multi Wound Chart Details Patient Name: Date of Service: [name], [name] 07/19/2020 10:30 A M Medical Record Number: [id number] Patient Account Number: [id number] Date of Birth/Sex: Treating RN: 1955-09-16 (64  y.o. [name], Meta.Reding Primary Care Kashmere Staffa: [name] Other Clinician: Referring Daviyon Widmayer: Treating Jc Veron/Extender: [name] in Treatment: 26 Vital Signs Height(in): 68 Pulse(bpm): 65 Weight(lbs): Blood Pressure(mmHg): 152/88 Body Mass Index(BMI): Temperature(F): 98.0 Respiratory Rate(breaths/min): 16 Photos: [15:No Photos Left, Medial Malleolus] [N/A:N/A N/A] Wound Location: [15:Gradually Appeared] [N/A:N/A] Wounding Event: [15:Venous Leg Ulcer] [N/A:N/A] Primary Etiology: [15:Peripheral Venous Disease,] [N/A:N/A] Comorbid History: [15:Osteoarthritis, Confinement Anxiety 12/30/2019] [N/A:N/A] Date Acquired: [15:26] [N/A:N/A] Weeks of Treatment: [15:Open] [N/A:N/A] Wound Status: [15:1.3x0.7x0.1] [N/A:N/A] Measurements L x W x D (cm) [15:0.715] [N/A:N/A] A (cm) : rea [15:0.071] [N/A:N/A] Volume (cm) : [15:93.90%] [N/A:N/A] % Reduction in A [15:rea: 99.20%] [N/A:N/A] % Reduction in Volume: [15:Full Thickness With Exposed Support N/A] Classification: [15:Structures Medium] [N/A:N/A] Exudate A mount: [15:Serosanguineous] [N/A:N/A] Exudate Type: [15:red, brown] [N/A:N/A] Exudate Color: [15:Flat and Intact] [N/A:N/A] Wound  Margin: [15:Large (67-100%)] [N/A:N/A] Granulation A mount: [15:Red] [N/A:N/A] Granulation Quality: [15:Small (1-33%)] [N/A:N/A] Necrotic A mount: [15:Fat Layer (Subcutaneous Tissue): Yes N/A] Exposed Structures: [15:Fascia: No Tendon: No Muscle: No Joint: No Bone: No Medium (34-66%)] [N/A:N/A] Epithelialization: [15:Debridement - Excisional] [N/A:N/A] Debridement: Pre-procedure Verification/Time Out 11:10 [N/A:N/A] Taken: [15:Lidocaine 4% Topical Solution] [N/A:N/A] Pain Control: [15:Subcutaneous, Slough] [N/A:N/A] Tissue Debrided: [15:Skin/Subcutaneous Tissue] [N/A:N/A] Level: [15:0.51] [N/A:N/A] Debridement A (sq cm): [15:rea Curette] [N/A:N/A] Instrument: [15:Minimum] [N/A:N/A] Bleeding: [15:Pressure] [N/A:N/A] Hemostasis A chieved: [15:0] [N/A:N/A] Procedural Pain: [15:0] [N/A:N/A] Post Procedural Pain: [15:Procedure was tolerated well] [N/A:N/A] Debridement Treatment Response: [15:1.7x0.3x0.1] [N/A:N/A] Post Debridement Measurements L x W x D (cm) [15:0.04] [N/A:N/A] Post Debridement Volume: (cm) [15:Compression Therapy] [N/A:N/A] Procedures Performed: [15:Debridement] Treatment Notes Electronic Signature(s) Signed: 07/19/2020 5:29:55 PM By: Linton Ham MD Signed: 07/19/2020 5:51:10 PM By: Deon Pilling Entered By: Linton Ham on 07/19/2020 11:21:11 -------------------------------------------------------------------------------- Multi-Disciplinary Care Plan Details Patient Name: Date of Service: [name], [name] 07/19/2020 10:30 A M Medical Record Number: [id number] Patient Account Number: [id number] Date of Birth/Sex: Treating RN: [birth year] (65 y.o. [name] Primary Care Milia Warth: [name] Other Clinician: Referring Shantasia Hunnell: Treating Stuart Guillen/Extender: [name] in Treatment: 26 Active Inactive Venous Leg Ulcer Nursing Diagnoses: Actual venous Insuffiency (use after diagnosis is confirmed) Knowledge deficit  related to disease process and management Goals: Patient will maintain optimal edema control Date Initiated: 06/20/2020 Target Resolution Date: 08/03/2020 Goal Status: Active Interventions: Assess peripheral edema status every visit. Compression as ordered Treatment Activities: Therapeutic compression applied : 06/20/2020 Notes: Wound/Skin Impairment Nursing Diagnoses: Impaired tissue integrity Knowledge deficit related to ulceration/compromised skin integrity Goals: Patient/caregiver will verbalize understanding of skin care regimen Date Initiated: 01/17/2020 Target Resolution Date: 08/03/2020 Goal Status: Active Ulcer/skin breakdown will have a volume reduction of 30% by week 4 Date Initiated: 01/17/2020 Date Inactivated: 03/12/2020 Target Resolution Date: 03/09/2020 Goal Status: Met Interventions: Assess patient/caregiver ability to obtain necessary supplies Assess patient/caregiver ability to perform ulcer/skin care regimen upon admission and as needed Assess ulceration(s) every visit Provide education on ulcer and skin care Notes: Electronic Signature(s) Signed: 07/19/2020 5:51:10 PM By: Deon Pilling Entered By: Deon Pilling on 07/19/2020 10:59:08 -------------------------------------------------------------------------------- Pain Assessment Details Patient Name: Date of Service: [name], [name] 07/19/2020 10:30 A M Medical Record Number: [id number] Patient Account Number: [id number] Date of Birth/Sex: Treating RN: 06-24-1955 (65 y.o. [name] Primary Care Avyay Coger: [name] Other Clinician: Referring Welden Hausmann: Treating Teauna Dubach/Extender: [name] in Treatment: 26 Active Problems Location of Pain Severity and Description of Pain Patient Has Paino No Site Locations Pain Management and Medication Current Pain Management: Electronic  Signature(s) Signed: 07/20/2020 4:25:56 PM By: Levan Hurst RN, BSN Entered By: Levan Hurst on 07/19/2020 10:50:03 -------------------------------------------------------------------------------- Patient/Caregiver Education Details Patient Name: Date of Service: [name], [name] 2/17/2022andnbsp10:30 A M Medical Record Number: [id number] Patient Account Number: [id number] Date of Birth/Gender: Treating RN: 11-03-1955 (65 y.o. [name] Primary Care Physician: [name] Other Clinician: Referring Physician: Treating Physician/Extender: [name] in Treatment: 26 Education Assessment Education Provided To: Patient Education Topics Provided Wound/Skin Impairment: Handouts: Skin Care Do's and Dont's Methods: Explain/Verbal Responses: Reinforcements needed Electronic Signature(s) Signed: 07/19/2020 5:51:10 PM By: Deon Pilling Entered By: Deon Pilling on 07/19/2020 10:59:28 -------------------------------------------------------------------------------- Wound Assessment Details Patient Name: Date of Service: [name] [name], [name] 07/19/2020 10:30 A M Medical Record Number: [id number] Patient Account Number: [id number] Date of Birth/Sex: Treating RN: 1955-12-24 (65 y.o. [name] Primary Care Catherine Cubero: [name] Other Clinician: Referring Jery Hollern: Treating Teigen Parslow/Extender: [name] in Treatment: 26 Wound Status Wound Number: 15 Primary Venous Leg Ulcer Etiology: Wound Location: Left, Medial Malleolus Wound Status: Open Wounding Event: Gradually Appeared Comorbid Peripheral Venous Disease, Osteoarthritis, Confinement Date Acquired: 12/30/2019 History: Anxiety Weeks Of Treatment: 26 Clustered Wound: No Wound Measurements Length: (cm) 1.3 Width: (cm) 0.7 Depth: (cm) 0.1 Area: (cm) 0.715 Volume: (cm) 0.071 % Reduction in Area: 93.9% % Reduction in Volume: 99.2% Epithelialization: Medium (34-66%) Tunneling: No Undermining: No Wound Description Classification: Full  Thickness With Exposed Support Structures Wound Margin: Flat and Intact Exudate Amount: Medium Exudate Type: Serosanguineous Exudate Color: red, brown Foul Odor After Cleansing: No Slough/Fibrino Yes Wound Bed Granulation Amount: Large (67-100%) Exposed Structure Granulation Quality: Red Fascia Exposed: No Necrotic Amount: Small (1-33%) Fat Layer (Subcutaneous Tissue) Exposed: Yes Necrotic Quality: Adherent Slough Tendon Exposed: No Muscle Exposed: No Joint Exposed: No Bone Exposed: No Treatment Notes Wound #15 (Malleolus) Wound Laterality: Left, Medial Cleanser Soap and Water Discharge Instruction: May shower and wash wound with dial antibacterial soap and water prior to dressing change. Peri-Wound Care Sween Lotion (Moisturizing lotion) Discharge Instruction: Apply moisturizing lotion as directed Topical Primary Dressing Endoform 2x2 in Discharge Instruction: Moisten with saline Secondary Dressing Woven Gauze Sponge, Non-Sterile 4x4 in Discharge Instruction: Apply over primary dressing as directed. ABD Pad, 5x9 Discharge Instruction: Apply over primary dressing as directed. Secured With Compression Wrap Unnaboot w/Calamine, 4x10 (in/yd) Discharge Instruction: Apply Unnaboot as directed. Compression Stockings Add-Ons Electronic Signature(s) Signed: 07/20/2020 4:25:56 PM By: Levan Hurst RN, BSN Entered By: Levan Hurst on 07/19/2020 10:58:42 -------------------------------------------------------------------------------- [place] Details Patient Name: Date of Service: [name], [name] 07/19/2020 10:30 A M Medical Record Number: [id number] Patient Account Number: [id number] Date of Birth/Sex: Treating RN: Jun 10, 1955 (65 y.o. [name] Primary Care Adamarys Shall: [name] Other Clinician: Referring Shamara Soza: Treating Lindy Pennisi/Extender: [name] in Treatment: 26 Vital Signs Time Taken: 10:46 Temperature (F):  98.0 Height (in): 68 Pulse (bpm): 65 Respiratory Rate (breaths/min): 16 Blood Pressure (mmHg): 152/88 Reference Range: 80 - 120 mg / dl Electronic Signature(s) Signed: 07/20/2020 4:25:56 PM By: Levan Hurst RN, BSN Entered By: Levan Hurst on 07/19/2020 10:49:50

## 2020-07-27 ENCOUNTER — Telehealth: Payer: Self-pay | Admitting: Podiatry

## 2020-07-27 ENCOUNTER — Other Ambulatory Visit: Payer: Self-pay

## 2020-07-27 ENCOUNTER — Ambulatory Visit (INDEPENDENT_AMBULATORY_CARE_PROVIDER_SITE_OTHER): Payer: Medicare Other | Admitting: Podiatry

## 2020-07-27 ENCOUNTER — Encounter: Payer: Self-pay | Admitting: Podiatry

## 2020-07-27 DIAGNOSIS — M21542 Acquired clubfoot, left foot: Secondary | ICD-10-CM | POA: Diagnosis not present

## 2020-07-27 DIAGNOSIS — M216X2 Other acquired deformities of left foot: Secondary | ICD-10-CM | POA: Diagnosis not present

## 2020-07-27 DIAGNOSIS — L97322 Non-pressure chronic ulcer of left ankle with fat layer exposed: Secondary | ICD-10-CM

## 2020-07-27 NOTE — Telephone Encounter (Signed)
[facility] called requesting an order status. Please advise.

## 2020-08-01 ENCOUNTER — Telehealth: Payer: Self-pay | Admitting: *Deleted

## 2020-08-01 NOTE — Telephone Encounter (Signed)
Crystal w/ [facility] is requesting an add on discipline [phone]) for patient. Please fax to [phone]. May contact them at [phone].

## 2020-08-02 ENCOUNTER — Encounter (HOSPITAL_BASED_OUTPATIENT_CLINIC_OR_DEPARTMENT_OTHER): Payer: Medicare Other | Admitting: Physician Assistant

## 2020-08-03 ENCOUNTER — Encounter (HOSPITAL_BASED_OUTPATIENT_CLINIC_OR_DEPARTMENT_OTHER): Payer: Medicare Other | Attending: Internal Medicine | Admitting: Physician Assistant

## 2020-08-03 ENCOUNTER — Other Ambulatory Visit: Payer: Self-pay

## 2020-08-03 DIAGNOSIS — L97328 Non-pressure chronic ulcer of left ankle with other specified severity: Secondary | ICD-10-CM | POA: Insufficient documentation

## 2020-08-03 DIAGNOSIS — L97828 Non-pressure chronic ulcer of other part of left lower leg with other specified severity: Secondary | ICD-10-CM | POA: Diagnosis not present

## 2020-08-03 DIAGNOSIS — I87332 Chronic venous hypertension (idiopathic) with ulcer and inflammation of left lower extremity: Secondary | ICD-10-CM | POA: Diagnosis not present

## 2020-08-03 NOTE — Progress Notes (Signed)
MALARIE, CRESAP ([id number]) Visit Report for 08/03/2020 Arrival Information Details Patient Name: Date of Service: [place] [name], [name] 08/03/2020 10:30 A M Medical Record Number: [id number] Patient Account Number: [id number] Date of Birth/Sex: Treating RN: 1956-05-09 (65 y.o. [name] Eryka Dolinger: [name] Other Clinician: Referring Temitope Flammer: Treating Kaylise Blakeley/Extender: [name] in Treatment: 28 Visit Information History Since Last Visit Added or deleted any medications: No Patient Arrived: Cane Any new allergies or adverse reactions: No Arrival Time: 10:33 Had a fall or experienced change in No Accompanied By: self activities of daily living that may affect Transfer Assistance: None risk of falls: Patient Identification Verified: Yes Signs or symptoms of abuse/neglect since last visito No Secondary Verification Process Completed: Yes Hospitalized since last visit: No Patient Requires Transmission-Based Precautions: No Implantable device outside of the clinic excluding No Patient Has Alerts: Yes cellular tissue based products placed in the center Patient Alerts: L ABI =1.12, TBI = .92 since last visit: R ABI= 1.02, TBI= .58 Has Dressing in Place as Prescribed: Yes Has Compression in Place as Prescribed: Yes Pain Present Now: Yes Electronic Signature(s) Signed: 08/03/2020 5:05:02 PM By: Baruch Gouty RN, BSN Entered By: Baruch Gouty on 08/03/2020 10:35:35 -------------------------------------------------------------------------------- Compression Therapy Details Patient Name: Date of Service: [name], [name] 08/03/2020 10:30 A M Medical Record Number: [id number] Patient Account Number: [id number] Date of Birth/Sex: Treating RN: 1955-10-05 (65 y.o. [name] Deleah Tison: [name] Other Clinician: Referring Tamra Koos: Treating Markale Birdsell/Extender: [name] in Treatment:  28 Compression Therapy Performed for Wound Assessment: Wound #15 Left,Medial Malleolus Performed By: Clinician Baruch Gouty, RN Compression Type: Rolena Infante Electronic Signature(s) Signed: 08/03/2020 5:05:02 PM By: Baruch Gouty RN, BSN Entered By: Baruch Gouty on 08/03/2020 10:55:55 -------------------------------------------------------------------------------- Encounter Discharge Information Details Patient Name: Date of Service: [name], [name] 08/03/2020 10:30 A M Medical Record Number: [id number] Patient Account Number: [id number] Date of Birth/Sex: Treating RN: [birth year] (65 y.o. [name] Joshoa Shawler: [name] Other Clinician: Referring Nithila Sumners: Treating Lamon Rotundo/Extender: [name] in Treatment: 28 Encounter Discharge Information Items Discharge Condition: Stable Ambulatory Status: Cane Discharge Destination: Home Transportation: Private Auto Accompanied By: self Schedule Follow-up Appointment: Yes Clinical Summary of Care: Patient Declined Electronic Signature(s) Signed: 08/03/2020 5:05:02 PM By: Baruch Gouty RN, BSN Entered By: Baruch Gouty on 08/03/2020 10:56:45 -------------------------------------------------------------------------------- Patient/Caregiver Education Details Patient Name: Date of Service: [name], [name] 3/4/2022andnbsp10:30 A M Medical Record Number: [id number] Patient Account Number: [id number] Date of Birth/Gender: Treating RN: May 24, 1956 (65 y.o. [name] Physician: [name] Other Clinician: Referring Physician: Treating Physician/Extender: [name] in Treatment: 28 Education Assessment Education Provided To: Patient Education Topics Provided Venous: Methods: Explain/Verbal Responses: Reinforcements needed, State content correctly Electronic Signature(s) Signed: 08/03/2020 5:05:02 PM By: Baruch Gouty RN,  BSN Entered By: Baruch Gouty on 08/03/2020 10:56:28 -------------------------------------------------------------------------------- Wound Assessment Details Patient Name: Date of Service: [name], [name] 08/03/2020 10:30 A M Medical Record Number: [id number] Patient Account Number: [id number] Date of Birth/Sex: Treating RN: 03-Dec-1955 (65 y.o. [name] Raisha Brabender: [name] Other Clinician: Referring Sara Keys: Treating Marajade Lei/Extender: [name] in Treatment: 28 Wound Status Wound Number: 15 Primary Venous Leg Ulcer Etiology: Wound Location: Left, Medial Malleolus Wound Status: Open Wounding Event: Gradually Appeared Comorbid Peripheral Venous Disease, Osteoarthritis, Confinement Date Acquired: 12/30/2019  History: Anxiety Weeks Of Treatment: 28 Clustered Wound: No Wound Measurements Length: (cm) 1.3 Width: (cm) 0.8 Depth: (cm) 0.1 Area: (cm) 0.817 Volume: (cm) 0.082 % Reduction in Area: 93% % Reduction in Volume: 99.1% Epithelialization: Small (1-33%) Tunneling: No Undermining: No Wound Description Classification: Full Thickness With Exposed Support Structures Wound Margin: Flat and Intact Exudate Amount: Medium Exudate Type: Serosanguineous Exudate Color: red, brown Foul Odor After Cleansing: No Slough/Fibrino Yes Wound Bed Granulation Amount: Large (67-100%) Exposed Structure Granulation Quality: Red Fascia Exposed: No Necrotic Amount: Small (1-33%) Fat Layer (Subcutaneous Tissue) Exposed: Yes Necrotic Quality: Adherent Slough Tendon Exposed: No Muscle Exposed: No Joint Exposed: No Bone Exposed: No Treatment Notes Wound #15 (Malleolus) Wound Laterality: Left, Medial Cleanser Soap and Water Discharge Instruction: May shower and wash wound with dial antibacterial soap and water prior to dressing change. Peri-Wound Care Sween Lotion (Moisturizing lotion) Discharge Instruction: Apply  moisturizing lotion as directed Topical Primary Dressing Endoform 2x2 in Discharge Instruction: Moisten with saline Secondary Dressing Woven Gauze Sponge, Non-Sterile 4x4 in Discharge Instruction: Apply over primary dressing as directed. ABD Pad, 5x9 Discharge Instruction: Apply over primary dressing as directed. Secured With Compression Wrap Unnaboot w/Calamine, 4x10 (in/yd) Discharge Instruction: Apply Unnaboot as directed. Compression Stockings Add-Ons Electronic Signature(s) Signed: 08/03/2020 5:05:02 PM By: Baruch Gouty RN, BSN Entered By: Baruch Gouty on 08/03/2020 10:55:32 -------------------------------------------------------------------------------- [place] Details Patient Name: Date of Service: [name], [name] 08/03/2020 10:30 A M Medical Record Number: [id number] Patient Account Number: [id number] Date of Birth/Sex: Treating RN: Feb 09, 1956 (65 y.o. [name] Brionna Romanek: [name] Other Clinician: Referring Zuhayr Deeney: Treating Mcarthur Ivins/Extender: [name] in Treatment: 28 Vital Signs Time Taken: 10:35 Temperature (F): 97.8 Height (in): 68 Pulse (bpm): 56 Source: Stated Respiratory Rate (breaths/min): 18 Source: Stated Blood Pressure (mmHg): 122/73 Reference Range: 80 - 120 mg / dl Electronic Signature(s) Signed: 08/03/2020 5:05:02 PM By: Baruch Gouty RN, BSN Entered By: Baruch Gouty on 08/03/2020 10:36:31

## 2020-08-03 NOTE — Progress Notes (Signed)
[name], [name] ([id number]) Visit Report for 08/03/2020 [place] Details Patient Name: Date of Service: [facility] [name], [name] 08/03/2020 Medical Record Number: [id number] Patient Account Number: [id number] Date of Birth/Sex: Treating RN: [birth year] (65 y.o. 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

## 2020-08-05 NOTE — Progress Notes (Signed)
   Subjective:  Patient presents today status post tibiotalar calcaneal arthrodesis left lower extremity. DOS: 12/15/2019.  Patient continues to be treated at the [facility] weekly for wound care to the medial aspect of the ankle.  Patient continues to see improvement and she has been weightbearing in the cam boot daily.  Past Medical History:  Diagnosis Date  . Allergy   . Ankle wound 02/2016   bilateral  . Dental bridge present    lower  . Dental crowns present    upper right  . Open wounds involving multiple regions of lower extremity   . Peripheral vascular disease (HCC)    venous stasis ulcers bilateral lower extremities  . PONV (postoperative nausea and vomiting)    Objective/Physical Exam Neurovascular status intact.  Otherwise the foot is in a rectus position with good alignment of the foot in relationship to the leg.  The foot is in an almost 90 degree angle.  There is some palpable orthopedic hardware noted to the lateral aspect of the heel bone.  Mild sensitivity to the area.  No open wounds noted.  Radiographic exam: Orthopedic hardware appears intact and stable.  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  Patient has now over 3 months postoperative and radiographically appears stable and healing well.  No significant change since last x-rays  Assessment: 1. s/p tibiotalar calcaneal arthrodesis left. DOS: 12/15/2019 2.  Postsurgical edema left-mostly resolved 3.  Ulcer right medial ankle secondary to venous insufficiency 4.  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 Patient was evaluated.  2.  Multilayer Unna boot compression wrap was reapplied to the lower extremity 3.  Recommend shoes at the [employer] in [place] 4.  Patient may discontinue the cam boot.  Slowly increase activity 5.  Continue management at [facility] for the medial ankle ulcer 6.  Return to clinic in 3 months  Edrick Kins, DPM [facility]  Dr. Edrick Kins, DPM    [address], [place] [address]                Office [phone]  Fax [phone]

## 2020-08-09 ENCOUNTER — Encounter (HOSPITAL_BASED_OUTPATIENT_CLINIC_OR_DEPARTMENT_OTHER): Payer: Medicare Other | Admitting: Physician Assistant

## 2020-08-09 ENCOUNTER — Other Ambulatory Visit: Payer: Self-pay

## 2020-08-09 DIAGNOSIS — I87332 Chronic venous hypertension (idiopathic) with ulcer and inflammation of left lower extremity: Secondary | ICD-10-CM | POA: Diagnosis not present

## 2020-08-09 NOTE — Progress Notes (Signed)
[name], [name] ([id number]) Visit Report for 08/09/2020 Arrival Information Details Patient Name: Date of Service: [name], [name] 08/09/2020 10:30 A M Medical Record Number: [id number] Patient Account Number: [id number] Date of Birth/Sex: Treating RN: 21-Mar-1956 (66 y.o. [name], Meta.Reding Primary Care Damond Borchers: [name] Other Clinician: Referring Christee Mervine: Treating Oneta Sigman/Extender: [name] in Treatment: 29 Visit Information History Since Last Visit Added or deleted any medications: No Patient Arrived: Ambulatory Any new allergies or adverse reactions: No Arrival Time: 10:27 Had a fall or experienced change in No Accompanied By: self activities of daily living that may affect Transfer Assistance: None risk of falls: Patient Identification Verified: Yes Signs or symptoms of abuse/neglect since last visito No Secondary Verification Process Completed: Yes Hospitalized since last visit: No Patient Requires Transmission-Based Precautions: No Implantable device outside of the clinic excluding No Patient Has Alerts: Yes cellular tissue based products placed in the center Patient Alerts: L ABI =1.12, TBI = .92 since last visit: R ABI= 1.02, TBI= .58 Has Dressing in Place as Prescribed: Yes Has Compression in Place as Prescribed: Yes Pain Present Now: No Electronic Signature(s) Signed: 08/09/2020 5:47:26 PM By: Deon Pilling Entered By: Deon Pilling on 08/09/2020 10:54:56 -------------------------------------------------------------------------------- Compression Therapy Details Patient Name: Date of Service: [name], [name] 08/09/2020 10:30 A M Medical Record Number: [id number] Patient Account Number: [id number] Date of Birth/Sex: Treating RN: [birth year] (65 y.o. [name] Primary Care Jerusalem Wert: [name] Other Clinician: Referring Breyana Follansbee: Treating Charlsie Fleeger/Extender: [name] in Treatment:  29 Compression Therapy Performed for Wound Assessment: Wound #15 Left,Medial Malleolus Performed By: Clinician Deon Pilling, RN Compression Type: Rolena Infante Electronic Signature(s) Signed: 08/09/2020 5:47:26 PM By: Deon Pilling Entered By: Deon Pilling on 08/09/2020 10:56:23 -------------------------------------------------------------------------------- Encounter Discharge Information Details Patient Name: Date of Service: [name], [name] 08/09/2020 10:30 A M Medical Record Number: [id number] Patient Account Number: [id number] Date of Birth/Sex: Treating RN: [birth year] (65 y.o. [name] Primary Care [name]: [name] Other Clinician: Referring Cristan Scherzer: Treating [name]/Extender: [name] in Treatment: 29 Encounter Discharge Information Items Discharge Condition: Stable Ambulatory Status: Cane Discharge Destination: Home Transportation: Private Auto Accompanied By: self Schedule Follow-up Appointment: Yes Clinical Summary of Care: Electronic Signature(s) Signed: 08/09/2020 5:47:26 PM By: Deon Pilling Entered By: Deon Pilling on 08/09/2020 10:57:04 -------------------------------------------------------------------------------- Patient/Caregiver Education Details Patient Name: Date of Service: [name], [name] 3/10/2022andnbsp10:30 A M Medical Record Number: [id number] Patient Account Number: [id number] Date of Birth/Gender: Treating RN: [birth year] (65 y.o. [name] Primary Care Physician: [name] Other Clinician: Referring Physician: Treating Physician/Extender: [name] in Treatment: 65 Education Assessment Education Provided To: Patient Education Topics Provided Wound/Skin Impairment: Handouts: Skin Care Do's and Dont's Methods: Explain/Verbal Responses: Reinforcements needed Electronic Signature(s) Signed: 08/09/2020 5:47:26 PM By: Deon Pilling Entered By: Deon Pilling on 08/09/2020 10:56:53 -------------------------------------------------------------------------------- Wound Assessment Details Patient Name: Date of Service: [name] [name], [name] 08/09/2020 10:30 A M Medical Record Number: [id number] Patient Account Number: [id number] Date of Birth/Sex: Treating RN: [birth year] (65 y.o. [name] Primary Care Marieanne Marxen: [name] Other Clinician: Referring [name]: Treating Treon Kehl/Extender: [name] Weeks in Treatment: 29 Wound Status Wound Number: 15 Primary Etiology: Venous Leg Ulcer Wound Location: Left, Medial Malleolus Wound Status: Open Wounding Event: Gradually Appeared Date Acquired: 12/30/2019 Weeks Of Treatment: 29 Clustered Wound: No Wound Measurements Length: (cm) 1.3  Width: (cm) 0.8 Depth: (cm) 0.1 Area: (cm) 0.817 Volume: (cm) 0.082 % Reduction in Area: 93% % Reduction in Volume: 99.1% Wound Description Classification: Full Thickness With Exposed Support Structures Treatment Notes Wound #15 (Malleolus) Wound Laterality: Left, Medial Cleanser Soap and Water Discharge Instruction: May shower and wash wound with dial antibacterial soap and water prior to dressing change. Peri-Wound Care Sween Lotion (Moisturizing lotion) Discharge Instruction: Apply moisturizing lotion as directed Topical Primary Dressing Endoform 2x2 in Discharge Instruction: Moisten with saline Secondary Dressing Woven Gauze Sponge, Non-Sterile 4x4 in Discharge Instruction: Apply over primary dressing as directed. ABD Pad, 5x9 Discharge Instruction: Apply over primary dressing as directed. Secured With Compression Wrap Unnaboot w/Calamine, 4x10 (in/yd) Discharge Instruction: Apply Unnaboot as directed. Compression Stockings Add-Ons Electronic Signature(s) Signed: 08/09/2020 5:47:26 PM By: Deon Pilling Entered By: Deon Pilling on 08/09/2020  10:56:10 -------------------------------------------------------------------------------- Vitals Details Patient Name: Date of Service: [name], [name] 08/09/2020 10:30 A M Medical Record Number: [id number] Patient Account Number: [id number] Date of Birth/Sex: Treating RN: [birth year] (65 y.o. [name], Meta.Reding Primary Care [name]: [name] Other Clinician: Referring Chipper Koudelka: Treating Senica Crall/Extender: [name] in Treatment: 29 Vital Signs Time Taken: 10:27 Temperature (F): 97.7 Height (in): 68 Pulse (bpm): 57 Respiratory Rate (breaths/min): 18 Blood Pressure (mmHg): 157/71 Reference Range: 80 - 120 mg / dl Electronic Signature(s) Signed: 08/09/2020 5:47:26 PM By: Deon Pilling Entered By: Deon Pilling on 08/09/2020 10:56:03

## 2020-08-09 NOTE — Progress Notes (Signed)
[name], [name] ([id number]) Visit Report for 08/09/2020 SuperBill Details Patient Name: Date of Service: [name], [name] 08/09/2020 Medical Record Number: [id number] Patient Account Number: [id number] Date of Birth/Sex: Treating RN: 1956/04/27 (65 y.o. [name], Meta.Reding Primary Care Provider: [name] Other Clinician: Referring Provider: Treating Provider/Extender: [name] in Treatment: 29 Diagnosis Coding ICD-10 Codes Code Description [phone] Chronic venous hypertension (idiopathic) with ulcer and inflammation of left lower extremity L97.828 Non-pressure chronic ulcer of other part of left lower leg with other specified severity L97.328 Non-pressure chronic ulcer of left ankle with other specified severity Facility Procedures CPT4 Code Description Modifier Quantity [id number] (Facility Use Only) [id number] - APPLY UNNA BOOT LT 1 Electronic Signature(s) Signed: 08/09/2020 5:28:00 PM By: [name] PA-C Signed: 08/09/2020 5:47:26 PM By: Deon Pilling Entered By: Deon Pilling on 08/09/2020 10:57:15

## 2020-08-16 ENCOUNTER — Other Ambulatory Visit: Payer: Self-pay

## 2020-08-16 ENCOUNTER — Encounter (HOSPITAL_BASED_OUTPATIENT_CLINIC_OR_DEPARTMENT_OTHER): Payer: Medicare Other | Admitting: Internal Medicine

## 2020-08-16 DIAGNOSIS — I87332 Chronic venous hypertension (idiopathic) with ulcer and inflammation of left lower extremity: Secondary | ICD-10-CM | POA: Diagnosis not present

## 2020-08-16 NOTE — Progress Notes (Signed)
[name], [name] ([id number]) Visit Report for 08/16/2020 Arrival Information Details Patient Name: Date of Service: [name], [name] 08/16/2020 9:45 A M Medical Record Number: [id number] Patient Account Number: [id number] Date of Birth/Sex: Treating RN: [birth year] (65 y.o. [name], [name] Primary Care Eliani Leclere: [name] Other Clinician: Referring Ludger Bones: Treating Olson Lucarelli/Extender: [name] in Treatment: 76 Visit Information History Since Last Visit Added or deleted any medications: No Patient Arrived: [name] Any new allergies or adverse reactions: No Arrival Time: 10:12 Had a fall or experienced change in No Accompanied By: self activities of daily living that may affect Transfer Assistance: None risk of falls: Patient Identification Verified: Yes Signs or symptoms of abuse/neglect since last visito No Secondary Verification Process Completed: Yes Hospitalized since last visit: No Patient Requires Transmission-Based Precautions: No Implantable device outside of the clinic excluding No Patient Has Alerts: Yes cellular tissue based products placed in the [name] Patient Alerts: L ABI =1.12, TBI = .92 since last visit: R ABI= 1.02, TBI= .58 Has Dressing in Place as Prescribed: Yes Pain Present Now: Yes Electronic Signature(s) Signed: 08/16/2020 12:58:14 PM By: [name] Entered By: [name] on 08/16/2020 10:12:59 -------------------------------------------------------------------------------- Encounter Discharge Information Details Patient Name: Date of Service: [name], [name] 08/16/2020 9:45 A M Medical Record Number: [id number] Patient Account Number: [id number] Date of Birth/Sex: Treating RN: 04-30-1956 (65 y.o. [name] Zackary Mckeone: [name] Other Clinician: Referring Gini Caputo: Treating Melondy Blanchard/Extender: [name] in Treatment: 30 Encounter Discharge Information  Items Post Procedure Vitals Discharge Condition: Stable Temperature (F): 98.0 Ambulatory Status: Cane Pulse (bpm): 60 Discharge Destination: Home Respiratory Rate (breaths/min): 18 Transportation: Private Auto Blood Pressure (mmHg): 107/68 Accompanied By: self Schedule Follow-up Appointment: Yes Clinical Summary of Care: Patient Declined Electronic Signature(s) Signed: 08/16/2020 5:44:19 PM By: Baruch Gouty RN, BSN Entered By: Baruch Gouty on 08/16/2020 11:24:08 -------------------------------------------------------------------------------- Lower Extremity Assessment Details Patient Name: Date of Service: [name], [name] 08/16/2020 9:45 A M Medical Record Number: [id number] Patient Account Number: [id number] Date of Birth/Sex: Treating RN: 1955-08-16 (65 y.o. [name] Primary Care Roxane Puerto: [name] Other Clinician: Referring Thurmon Mizell: Treating Milo Schreier/Extender: [name] in Treatment: 30 Edema Assessment Assessed: [name]: Yes] [Right: No] Edema: [Left: N] [Right: o] Calf Left: Right: Point of Measurement: 31 cm From Medial Instep 26 cm Ankle Left: Right: Point of Measurement: 11 cm From Medial Instep 21.5 cm Vascular Assessment Pulses: Dorsalis Pedis Palpable: [Left:Yes] Electronic Signature(s) Signed: 08/16/2020 5:43:09 PM By: [name] Signed: 08/16/2020 5:53:43 PM By: Deon Pilling Entered By: [name] on 08/16/2020 10:25:43 -------------------------------------------------------------------------------- Multi Wound Chart Details Patient Name: Date of Service: [name], [name] 08/16/2020 9:45 A M Medical Record Number: [id number] Patient Account Number: [id number] Date of Birth/Sex: Treating RN: [birth year] (65 y.o. [name] Primary Care Marshia Tropea: [name] Other Clinician: Referring Laura-Lee Villegas: Treating Birtha Hatler/Extender: [name] in Treatment: 30 Vital  Signs Height(in): 68 Pulse(bpm): 60 Weight(lbs): Blood Pressure(mmHg): 107/68 Body Mass Index(BMI): Temperature(F): 98.0 Respiratory Rate(breaths/min): 18 Photos: [15:No Photos Left, Medial Malleolus] [N/A:N/A N/A] Wound Location: [15:Gradually Appeared] [N/A:N/A] Wounding Event: [15:Venous Leg Ulcer] [N/A:N/A] Primary Etiology: [15:Peripheral Venous Disease,] [N/A:N/A] Comorbid History: [15:Osteoarthritis, Confinement Anxiety 12/30/2019] [N/A:N/A] Date Acquired: [15:30] [N/A:N/A] Weeks of Treatment: [15:Open] [N/A:N/A] Wound Status: [15:1.7x1x0.1] [N/A:N/A] Measurements L x W x D (cm) [15:1.335] [N/A:N/A] A (cm) : rea [15:0.134] [N/A:N/A] Volume (cm) : [15:88.60%] [N/A:N/A] % Reduction in Area: [15:98.60%] [  N/A:N/A] % Reduction in Volume: [15:Full Thickness With Exposed Support N/A] Classification: [15:Structures Medium] [N/A:N/A] Exudate Amount: [15:Serosanguineous] [N/A:N/A] Exudate Type: [15:red, brown] [N/A:N/A] Exudate Color: [15:Well defined, not attached] [N/A:N/A] Wound Margin: [15:Large (67-100%)] [N/A:N/A] Granulation Amount: [15:Red] [N/A:N/A] Granulation Quality: [15:Small (1-33%)] [N/A:N/A] Necrotic Amount: [15:Fat Layer (Subcutaneous Tissue): Yes N/A] Exposed Structures: [15:Fascia: No Tendon: No Muscle: No Joint: No Bone: No Debridement - Selective/Open Wound N/A] Debridement: Pre-procedure Verification/Time Out 10:58 [N/A:N/A] Taken: [15:Lidocaine 4% Topical Solution] [N/A:N/A] Pain Control: [15:Slough] [N/A:N/A] Tissue Debrided: [15:Non-Viable Tissue] [N/A:N/A] Level: [15:1.7] [N/A:N/A] Debridement A (sq cm): [15:rea Curette] [N/A:N/A] Instrument: [15:Minimum] [N/A:N/A] Bleeding: [15:Pressure] [N/A:N/A] Hemostasis A chieved: [15:0] [N/A:N/A] Procedural Pain: [15:0] [N/A:N/A] Post Procedural Pain: [15:Procedure was tolerated well] [N/A:N/A] Debridement Treatment Response: [15:1.7x1x0.2] [N/A:N/A] Post Debridement Measurements L x W x D (cm)  [15:0.267] [N/A:N/A] Post Debridement Volume: (cm) [15:Debridement] [N/A:N/A] Treatment Notes Wound #15 (Malleolus) Wound Laterality: Left, Medial Cleanser Soap and Water Discharge Instruction: May shower and wash wound with dial antibacterial soap and water prior to dressing change. Peri-Wound Care Sween Lotion (Moisturizing lotion) Discharge Instruction: Apply moisturizing lotion as directed Topical Primary Dressing Cutimed Sorbact Swab Discharge Instruction: Apply to wound bed as instructed Endoform 2x2 in Discharge Instruction: Moisten with saline Secondary Dressing Woven Gauze Sponge, Non-Sterile 4x4 in Discharge Instruction: Apply over primary dressing as directed. ABD Pad, 5x9 Discharge Instruction: Apply over primary dressing as directed. Secured With Compression Wrap Unnaboot w/Calamine, 4x10 (in/yd) Discharge Instruction: Apply Unnaboot as directed. Compression Stockings Add-Ons Electronic Signature(s) Signed: 08/16/2020 5:46:09 PM By: Linton Ham MD Signed: 08/16/2020 5:53:43 PM By: Deon Pilling Signed: 08/16/2020 5:53:43 PM By: Deon Pilling Entered By: Linton Ham on 08/16/2020 12:25:46 -------------------------------------------------------------------------------- Multi-Disciplinary Care Plan Details Patient Name: Date of Service: [name], [name] 08/16/2020 9:45 A M Medical Record Number: [id number] Patient Account Number: [id number] Date of Birth/Sex: Treating RN: 15-Mar-1956 (65 y.o. [name] Primary Care Shanell Aden: [name] Other Clinician: Referring Rogina Schiano: Treating Shandora Koogler/Extender: [name] in Treatment: 30 Multidisciplinary Care Plan reviewed with physician Active Inactive Venous Leg Ulcer Nursing Diagnoses: Actual venous Insuffiency (use after diagnosis is confirmed) Knowledge deficit related to disease process and management Goals: Patient will maintain optimal edema control Date  Initiated: 06/20/2020 Target Resolution Date: 08/31/2020 Goal Status: Active Interventions: Assess peripheral edema status every visit. Compression as ordered Treatment Activities: Therapeutic compression applied : 06/20/2020 Notes: Wound/Skin Impairment Nursing Diagnoses: Impaired tissue integrity Knowledge deficit related to ulceration/compromised skin integrity Goals: Patient/caregiver will verbalize understanding of skin care regimen Date Initiated: 01/17/2020 Target Resolution Date: 08/31/2020 Goal Status: Active Ulcer/skin breakdown will have a volume reduction of 30% by week 4 Date Initiated: 01/17/2020 Date Inactivated: 03/12/2020 Target Resolution Date: 03/09/2020 Goal Status: Met Interventions: Assess patient/caregiver ability to obtain necessary supplies Assess patient/caregiver ability to perform ulcer/skin care regimen upon admission and as needed Assess ulceration(s) every visit Provide education on ulcer and skin care Notes: Electronic Signature(s) Signed: 08/16/2020 5:53:43 PM By: Deon Pilling Entered By: Deon Pilling on 08/16/2020 10:11:18 -------------------------------------------------------------------------------- Pain Assessment Details Patient Name: Date of Service: [name], [name] 08/16/2020 9:45 A M Medical Record Number: [id number] Patient Account Number: [id number] Date of Birth/Sex: Treating RN: Jul 07, 1955 (65 y.o. [name] Primary Care Fumi Guadron: [name] Other Clinician: Referring Sandia Pfund: Treating Ama Mcmaster/Extender: [name] in Treatment: 30 Active Problems Location of Pain Severity and Description of Pain Patient Has Paino Yes Site Locations Rate the pain. Current Pain Level: 4 Pain Management and  Medication Current Pain Management: Electronic Signature(s) Signed: 08/16/2020 12:58:14 PM By: [name] Signed: 08/16/2020 5:53:43 PM By: Deon Pilling Entered By: [name] on  08/16/2020 10:13:45 -------------------------------------------------------------------------------- Patient/Caregiver Education Details Patient Name: Date of Service: [name], [name] 3/17/2022andnbsp9:45 A M Medical Record Number: [id number] Patient Account Number: [id number] Date of Birth/Gender: Treating RN: 30-Dec-1955 (65 y.o. [name] Primary Care Physician: [name] Other Clinician: Referring Physician: Treating Physician/Extender: [name] in Treatment: 30 Education Assessment Education Provided To: Patient Education Topics Provided Wound/Skin Impairment: Handouts: Skin Care Do's and Dont's Methods: Explain/Verbal Responses: Reinforcements needed Electronic Signature(s) Signed: 08/16/2020 5:53:43 PM By: Deon Pilling Entered By: Deon Pilling on 08/16/2020 10:11:33 -------------------------------------------------------------------------------- Wound Assessment Details Patient Name: Date of Service: [name], [name] 08/16/2020 9:45 A M Medical Record Number: [id number] Patient Account Number: [id number] Date of Birth/Sex: Treating RN: 1956-04-17 (65 y.o. [name], [name] Primary Care Sinia Antosh: [name] Other Clinician: Referring Gaelyn Tukes: Treating [name]/Extender: [name] in Treatment: 30 Wound Status Wound Number: 15 Primary Venous Leg Ulcer Etiology: Wound Location: Left, Medial Malleolus Wound Status: Open Wounding Event: Gradually Appeared Comorbid Peripheral Venous Disease, Osteoarthritis, Confinement Date Acquired: 12/30/2019 History: Anxiety Weeks Of Treatment: 30 Clustered Wound: No Photos Wound Measurements Length: (cm) 1.7 Width: (cm) 1 Depth: (cm) 0.1 Area: (cm) 1.335 Volume: (cm) 0.134 % Reduction in Area: 88.6% % Reduction in Volume: 98.6% Wound Description Classification: Full Thickness With Exposed Support Structures Wound Margin: Well defined, not  attached Exudate Amount: Medium Exudate Type: Serosanguineous Exudate Color: red, brown Foul Odor After Cleansing: No Slough/Fibrino Yes Wound Bed Granulation Amount: Large (67-100%) Exposed Structure Granulation Quality: Red Fascia Exposed: No Necrotic Amount: Small (1-33%) Fat Layer (Subcutaneous Tissue) Exposed: Yes Necrotic Quality: Adherent Slough Tendon Exposed: No Muscle Exposed: No Joint Exposed: No Bone Exposed: No Treatment Notes Wound #15 (Malleolus) Wound Laterality: Left, Medial Cleanser Soap and Water Discharge Instruction: May shower and wash wound with dial antibacterial soap and water prior to dressing change. Peri-Wound Care Sween Lotion (Moisturizing lotion) Discharge Instruction: Apply moisturizing lotion as directed Topical Primary Dressing Cutimed Sorbact Swab Discharge Instruction: Apply to wound bed as instructed Endoform 2x2 in Discharge Instruction: Moisten with saline Secondary Dressing Woven Gauze Sponge, Non-Sterile 4x4 in Discharge Instruction: Apply over primary dressing as directed. ABD Pad, 5x9 Discharge Instruction: Apply over primary dressing as directed. Secured With Compression Wrap Unnaboot w/Calamine, 4x10 (in/yd) Discharge Instruction: Apply Unnaboot as directed. Compression Stockings Add-Ons Electronic Signature(s) Signed: 08/16/2020 5:41:28 PM By: [name] Signed: 08/16/2020 5:53:43 PM By: Deon Pilling Entered By: [name] on 08/16/2020 17:12:37 -------------------------------------------------------------------------------- Vitals Details Patient Name: Date of Service: [name], [name] 08/16/2020 9:45 A M Medical Record Number: [id number] Patient Account Number: [id number] Date of Birth/Sex: Treating RN: Jul 08, 1955 (65 y.o. [name], Meta.Reding Primary Care Marcia Lepera: [name] Other Clinician: Referring Taivon Haroon: Treating Shaquela Weichert/Extender: [name] in Treatment:  30 Vital Signs Time Taken: 10:12 Temperature (F): 98.0 Height (in): 68 Pulse (bpm): 60 Respiratory Rate (breaths/min): 18 Blood Pressure (mmHg): 107/68 Reference Range: 80 - 120 mg / dl Electronic Signature(s) Signed: 08/16/2020 12:58:14 PM By: [name] Entered By: [name] on 08/16/2020 10:13:18

## 2020-08-16 NOTE — Progress Notes (Signed)
[name], BURDITT ([id number]) Visit Report for 08/16/2020 Debridement Details Patient Name: Date of Service: [name], [name] [name] 08/16/2020 9:45 A M Medical Record Number: [id number] Patient Account Number: [id number] Date of Birth/Sex: Treating RN: [birth year] (65 y.o. [name], [name] Primary Care Provider: [name] Other Clinician: Referring Provider: Treating Provider/Extender: [name] in Treatment: 30 Debridement Performed for Assessment: Wound #15 Left,Medial Malleolus Performed By: Physician [name]., MD Debridement Type: Debridement Severity of Tissue Pre Debridement: Fat layer exposed Level of Consciousness (Pre-procedure): Awake and Alert Pre-procedure Verification/Time Out Yes - 10:58 Taken: Start Time: 10:59 Pain Control: Lidocaine 4% T opical Solution T Area Debrided (L x W): otal 1.7 (cm) x 1 (cm) = 1.7 (cm) Tissue and other material debrided: Non-Viable, Slough, Slough Level: Non-Viable Tissue Debridement Description: Selective/Open Wound Instrument: Curette Bleeding: Minimum Hemostasis Achieved: Pressure End Time: 11:01 Procedural Pain: 0 Post Procedural Pain: 0 Response to Treatment: Procedure was tolerated well Level of Consciousness (Post- Awake and Alert procedure): Post Debridement Measurements of Total Wound Length: (cm) 1.7 Width: (cm) 1 Depth: (cm) 0.2 Volume: (cm) 0.267 Character of Wound/Ulcer Post Debridement: Improved Severity of Tissue Post Debridement: Fat layer exposed Post Procedure Diagnosis Same as Pre-procedure Electronic Signature(s) Signed: 08/16/2020 5:46:09 PM By: Linton Ham MD Signed: 08/16/2020 5:53:43 PM By: Deon Pilling Entered By: Linton Ham on 08/16/2020 12:26:30 -------------------------------------------------------------------------------- HPI Details Patient Name: Date of Service: [name], [name] G. 08/16/2020 9:45 A M Medical Record Number: [id number] Patient  Account Number: [id number] Date of Birth/Sex: Treating RN: Feb 18, 1956 (65 y.o. [name] Primary Care Provider: [name] Other Clinician: Referring Provider: Treating Provider/Extender: [name] in Treatment: 30 History of Present Illness HPI Description: the remaining wound is over the left medial ankle. Similar wound over the right medial ankle healed largely with use of Apligraf. Most recently we have been using Hydrofera Blue over this wound with considerable improvement. The patient has been extensively worked up in the past for her venous insufficiency and she is not a candidate for antireflux surgery although I have none of the details available currently. 08/24/14; considerable improvement today. About 50% of this wound areas now epithelialized. The base of the wound appears to be healthier granulation.as opposed to last week when she had deteriorated a considerable improvement 08/17/14; unfortunately the wound has regressed somewhat. The areas of epithelialization from the superior aspect are not nearly as healthy as they were last week. The patient thinks her Hydrofera Blue slipped. 09/07/14; unfortunately the area has markedly regressed in the 2 weeks since I've seen this. There is an odor surrounding erythema. The healthy granulation tissue that we had at the base of the wound now is a dusky color. The nurse reports green drainage 09/14/14; the area looks somewhat better than last week. There is less erythema and less drainage. The culture I did did not show any growth. Nevertheless I think it is better to continue the Cipro and doxycycline for a further week. The remaining wound area was debridement. 09/21/14. Wound did not require debridement last week. Still less erythema and less drainage. She can complete her antibiotics. The areas of epithelialization in the superior aspect of the wound do not look as healthy as they did some weeks ago 10/05/14  continued improvement in the condition of this wound. There is advancing epithelialization. Less aggressive debridement required 10/19/14 continued improvement in the condition and volume of this wound. Less aggressive debridement to the inferior  part of this to remove surface slough and fibrinous eschar 11/02/14 no debridement is required. The surface granulation appears healthy although some of her islands of epithelialization seem to have regressed. No evidence of infection 11/16/14; lites surface debridement done of surface eschar. The wound does not look to be unhealthy. No evidence of infection. Unfortunately the patient has had podiatry issues in the right foot and for some reason has redeveloped small surface ulcerations in the medial right ankle. Her original presentation involved wounds in this area 11/23/14 no debridement. The area on the right ankle has enlarged. The left ankle wound appears stable in terms of the surface although there is periwound inflammation. There has been regression in the amount of new skin 11/30/14 no debridement. Both wound areas appear healthy. There was no evidence of infection. The the new area on the right medial ankle has enlarged although that both the surfaces appear to be stable. 12/07/14; Debridement of the right medial ankle wound. No no debridement was done on the left. 12/14/14 no major change in and now bilateral medial ankle wounds. Both of these are very painful but the no overt evidence of infection. She has had previous venous ablation 12/21/14; patient states that her right medial ankle wound is considerably more painful last week than usual. Her left is also somewhat painful. She could not tolerate debridement. The right medial ankle wound has fibrinous surface eschar 12/28/14 this is a patient with severe bilateral venous insufficiency ulcers. For a considerable period of time we actually had the one on the right medial ankle healed however this  recently opened up again in June. The left medial ankle wound has been a refractory area with some absent flows. We had some success with Hydrofera Blue on this area and it literally closed by 50% however it is recently opened up Foley. Both of these were debridement today of surface eschar. She tolerates this poorly 01/25/15: No change in the status of this. Thick adherent escar. Very poor tolerance of any attempt at debridement. I had healed the right medial malleolus wound for a considerable amount of time and had the left one down to about 50% of the volume although this is totally regressed over the last 48 weeks. Further the right leg has reopened. she is trying to make a appointment with pain and vascular, previous ablations with Dr. Aleda Grana. I do not believe there is an arterial insufficiency issue here 02/01/15 the status of the adherent eschar bilaterally is actually improved. No debridement was done. She did not manage to get vascular studies done 02/08/15 continued debridement of the area was done today. The slough is less adherent and comes off with less pressure. There is no surrounding infection peripheral pulses are intact 02/15/15 selective debridement with a disposable curette. Again the slough is less adherent and comes off with less difficulty. No surrounding infection peripheral pulses are intact. 02/22/15 selective debridement of the right medial ankle wound. Slough comes off with less difficulty. No obvious surrounding infection peripheral pulses are intact I did not debridement the one on the left. Both of these are stable to improved 03/01/15 selective debridement of both wound areas using a curette to. Adherent slough cup soft with less difficulty. No obvious surrounding infection. The patient tells me that 2 days ago she noted a rash above the right leg wrap. She did not have this on her lower legs when she change this over she arrives with widespread left greater than right  almost folliculitis-looking rash  which is extremely pruritic. I don't see anything to culture here. There is no rash on the rest of her body. She feels well systemically. 03/08/15; selective debridement of both wounds using a curette. Base of this does not look unhealthy. She had limegreen drainage coming out of the left leg wound and describes a lot of drainage. The rash on her left leg looks improved to. No cultures were done. 03/22/15; patient was not here last week. Basal wounds does not look healthy and there is no surrounding erythema. No drainage. There is still a rash on the left leg that almost looks vasculitic however it is clearly limited to the top of where the wrap would be. 04/05/15; on the right required a surgical debridement of surface eschar and necrotic subcutaneous tissue. I did not debridement the area on the left. These continue to be large open wounds that are not changing that much. We were successful at one point in healing the area on the right, and at the same time the area on the left was roughly half the size of current measurements. I think a lot of the deterioration has to do with the prolonged time the patient is on her feet at work 04/19/15 I attempted-like surface debridement bilaterally she does not tolerate this. She tells me that she was in allergic care yesterday with extreme pain over her left lateral malleolus/ankle and was told that she has an "sprain" 05/03/15; large bilateral venous insufficiency wounds over the medial malleolus/medial aspect of her ankles. She complains of copious amounts of drainage and his usual large amounts of pain. There is some increasing erythema around the wound on the right extending into the medial aspect of her foot to. historically she came in with these wounds the right one healed and the left one came down to roughly half its current size however the right one is reopened and the left is expanded. 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 05/10/15 large bilateral venous insufficiency wounds. There is less adherence surface left however the surface culture that I did last week grew pseudomonas therefore bilateral selective debridement score necessary. There is surrounding erythema. The patient describes severe bilateral drainage and a lot of pain in the left ankle. Apparently her podiatrist was were ready to do a cortisone shot 05/17/15; the patient complains of pain and again copious amounts of drainage. 05/24/15; we used Iodo flex last week. Patient notes considerable improvement in wound drainage. Only needed to change this once. 05/31/15; we continued Iodoflex; the base of these large wounds bilaterally is not too bad but there is probably likely a significant bioburden here. I would like to debridement just doesn't tolerate it. 06/06/14 I would like to continue the Iodoflex although she still hasn't managed to obtain supplies. She has bilateral medial malleoli or large wounds which are mostly superficial. Both of them are covered circumferentially with some nonviable fibrinous slough although she tolerates debridement very poorly. She apparently has an appointment for an ablation on the right leg by interventional radiology. 06/14/15; the patient arrives with the wounds and static condition. We attempted a debridement although she does not do well with this secondary to pain. I 07/05/15; wounds are not much smaller however there appears to be a cleaner granulating base. The left has tight fibrinous slough greater than the right. Debridement is tolerated poorly due to pain. Iodoflex is done more for these wounds in  any of the multitude of different dressings I have tried on the left 1 and then subsequently the right. 07/12/15; no change in the condition of this wound. I am able to do an aggressive debridement on the right but not the left. She simply cannot tolerate it. We have  been using Iodoflex which helps somewhat. It is worthwhile remembering that at one point we healed the right medial ankle wound and the left was about 25% of the current circumference. We have suggested returning to vascular surgery for review of possible further ablations for one reason or another she has not been able to do this. 07/26/15 no major change in the condition of either wound on her medial ankle. I did not attempt to debridement of these. She has been aggressively scrubbing these while she is in the shower at home. She has her supply of Iodoflex which seems to have done more for these wounds then anything I have put on recently. 08/09/15 wound area appears larger although not verified by measurements. Using Iodoflex 09/05/2015 -- she was here for avisit today but had significant problems with the wound and I was asked to see her for a physician opinion. I have summarize that this lady has had surgery on her left lower extremity about 10 years ago where the possible veins stripping was done. She has had an opinion from interventional radiology around November 2016 where no further sclerotherapy was ordered. The patient works 12 hours a day and stands on a concrete floor with work boots and is unable to get the proper compression she requires and cannot elevate her limbs appropriately at any given time. She has recently grown Pseudomonas from her wound culture but has not started her ciprofloxacin which was called in for her. 09/13/15 this continues to be a difficult situation for this patient. At one point I had this wound down to a 1.5 x 1.5" wound on her left leg. This is deteriorated and the right leg has reopened. She now has substantial wounds on her medial calcaneus, malleoli and into her lower leg. One on the left has surface eschar but these are far too painful for me to debridement here. She has a vascular surgery appointment next week to see if anything can be done to help here. I  think she has had previous ablations several years ago at [facility]. She has no major edema. She tells me that she did not get product last time [facility] and went for several days without it. She continues to work in work boots 12 hours a day. She cannot get compression/4-layer under her work boots. 09/20/15 no major change. Periwound edema control was not very good. Her point with pain and vascular is next Wednesday the 25th 09/28/15; the patient is seen vascular surgery and is apparently scheduled for repeat duplex ultrasounds of her bilateral lower legs next week. 10/05/15; the patient was seen by Dr. Doren Custard of vascular surgery. He feels that she should have arterial insufficiency excluded as cause/contributed to her nonhealing stage she is therefore booked for an arteriogram. She has apparently monophasic signals in the dorsalis pedis pulses. She also of course has known severe chronic venous insufficiency with previous procedures as noted previously. I had another long discussion with the patient today about her continuing to work 12 hour shifts. I've written her out for 2 months area had concerns about this as her work location is currently undergoing significant turmoil and this may lead to her termination. She is  aware of this however I agree with her that she simply cannot continue to stand for 12 hours multiple days a week with the substantial wound areas she has. 10/19/15; the Dr. Doren Custard appointment was largely for an arteriogram which was normal. She does not have an arterial issue. He didn't make a comment about her chronic venous insufficiency for which she has had previous ablations. Presumably it was not felt that anything additional could be done. The patient is now out of work as I prescribed 2 weeks ago. Her wounds look somewhat less aggravated presumably because of this. I felt I would give debridement another try today 10/25/15; no major change in this patient's wounds. We are  struggling to get her product that she can afford into her own home through her insurance. 11/01/15; no major change in the patient's wounds. I have been using silver alginate as the most affordable product. I spoke to Dr. Marla Roe last week with her requested take her to the OR for surgical debridement and placement of ACEL. Dr. Marla Roe told me that she would be willing to do this however [name] will not cover this, fortunately the patient has [place] healthcare of some variant 11/08/15; no major change in the patient's wounds. She has been completely nonviable surface that this but is in too much pain with any attempted debridement are clinic. I have arranged for her to see Dr. Marla Roe ham of plastic surgery and this appointment is on Monday. I am hopeful that they will take her to the OR for debridement, possible ACEL ultimately possible skin graft 11/22/15 no major change in the patient's wounds over her bilateral medial calcaneus medial malleolus into the lower legs. Surface on these does not look too bad however on the left there is surrounding erythema and tenderness. This may be cellulitis or could him sleepy tinea. 11/29/15; no major changes in the patient's wounds over her bilateral medial malleolus. There is no infection here and I don't think any additional antibiotics are necessary. There is now plan to move forward. She sees Dr. Marla Roe in a week's time for preparation for operative debridement and ACEL placement I believe on 7/12. She then has a follow-up appointment with Dr. Marla Roe on 7/21 12/28/15; the patient returns today having been taken to the [facility] by Dr. Marla Roe 12/12/15 she underwent debridement, intraoperative cultures [which were negative]. She had placement of a wound VAC. Parent really ACEL was not available to be placed. The wound VAC foam apparently adhered to the wound since then she's been using silver alginate, Xeroform under Ace wraps. She still  says there is a lot of drainage and a lot of pain 01/31/16; this is a patient I see monthly. I had referred her to Dr. Marla Roe him of plastic surgery for large wounds on her bilateral medial ankles. She has been to the OR twice once in early July and once in early August. She tells me over the last 3 weeks she has been using the wound VAC with ACEL underneath it. On the right we've simply been using silver alginate. Under Kerlix Coban wraps. 02/28/16; this is a patient I'm currently seeing monthly. She is gone on to have a skin graft over her large venous insufficiency ulcer on the left medial ankle. This was done by Dr. Marla Roe him. The patient is a bit perturbed about why she didn't have one on her right medial ankle wound. She has been using silver alginate to this. 03/06/16; I received a phone  call from her plastic surgery Dr. Marla Roe. She expressed some concern about the viability of the skin graft she did on the left medial ankle wound. Asked me to place Endoform on this. She told me she is not planning to do a subsequent skin graft on the right as the left one did not take very well. I had placed Hydrofera Blue on the right 03/13/16; continue to have a reasonably healthy wound on the right medial ankle. Down to 3 mm in terms of size. There is epithelialization here. The area on the left medial ankle is her skin graft site. I suppose the last week this looks somewhat better. She has an open area inferiorly however in the center there appears to be some viable tissue. There is a lot of surface callus and eschar that will eventually need to come off however none of this looked to be infected. Patient states that the is able to keep the dressing on for several days which is an improvement. 03/20/16 no major change in the circumference of either wound however on the left side the patient was at Dr. Eusebio Friendly office and they did a debridement of left wound. 50% of the wound seems to be  epithelialized. I been using Endoform on the left Hydrofera Blue in the right 03/27/16; she arrives today with her wound is not looking as healthy as they did last week. The area on the right clearly has an adherent surface to this a very similar surface on the left. Unfortunately for this patient this is all too familiar problem. Clearly the Endoform is not working and will need to change that today that has some potential to help this surface. She does not tolerate debridement in this clinic very well. She is changing the dressing wants 04/03/16; patient arrives with the wounds looking somewhat better especially on the right. Dr. Migdalia Dk change the dressing to silver alginate when she saw her on Monday and also sold her some compression socks. The usefulness of the latter is really not clear and woman with severely draining wounds. 04/10/16; the patient is doing a bit of an experiment wearing the compression stockings that Dr. Migdalia Dk provided her to her left leg and the out of legs based dressings that we provided to the right. 05/01/16; the patient is continuing to wear compression stockings Dr. Migdalia Dk provided her on the left that are apparently silver impregnated. She has been using Iodoflex to the right leg wound. Still a moderate amount of drainage, when she leaves here the wraps only last for 4 days. She has to change the stocking on the left leg every night 05/15/16; she is now using compression stockings bilaterally provided by Dr. Marla Roe. She is wearing a nonadherent layer over the wounds so really I don't think there is anything specific being done to this now. She has some reduction on the left wound. The right is stable. I think all healing here is being done without a specific dressing 06/09/16; patient arrives here today with not much change in the wound certainly in diameter to large circular wounds over the medial aspect of her ankle bilaterally. Under the light of these services are  certainly not viable for healing. There is no evidence of surrounding infection. She is wearing compression stockings with some sort of silver impregnation as prescribed by Dr. Marla Roe. She has a follow-up with her tomorrow. 06/30/16; no major change in the size or condition of her wounds. These are still probably covered with a nonviable surface.  She is using only her purchase stockings. She did see Dr. Marla Roe who seemed to want to apply Dakin's solution to this I'm not extreme short what value this would be. I would suggest Iodoflex which she still has at home. 07/28/16; I follow Mrs. Sohmer episodically along with Dr. Marla Roe. 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 She has been applying a topical collagen ointment to both wounds with Adaptic. I don't think Dr. Marla Roe is planning to take her back to the OR. 08/19/16; I follow Mrs. Jeneen Rinks on a monthly basis along with Dr. Marla Roe of plastic surgery. 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 I been following her for a number of years. At one point I was able to get the right medial malleolus wound to heal and had the left medial malleolus down to about half its current size however and I had to send her to plastic surgery for an operative debridement. Since then things have been stable to slightly improve the area on the right is slightly better one in the left about the same although there is much less adherent surface than I'm used to with this patient. She is using some form of liquid collagen gel that Dr. Marla Roe provided a Kerlix cover with the patient's own pressure stockings. She tells me that she has extreme pain in both ankles and along the lateral aspect of both feet. She has been unable to work for some period of time. She is telling me she is retiring at the beginning of April. She sees Dr.  Doran Durand of orthopedics next week 09/22/16; patient has not seen Dr. Marla Roe since the last time she is here. I'm not really sure what she is using to the wounds other than bits and pieces of think she had left over including most recently Hydrofera Blue. She is using juxtalite stockings. She is having difficulty with her husband's recent illness "stroke". She is having to transport him to various doctors appointments. Dr. Marla Roe left her the option of a repeat debridement with ACEL however she has not been able to get the time to follow-up on this. She continues to have a fair amount of drainage out of these wounds with certainly precludes leaving dressings on all week 10/13/16; patient has not seen Dr. Marla Roe since she was last in our clinic. I'm not really sure what she is doing with the wounds, we did try to get her [facility] and I think she is actually using this most of the time. Because of drainage she states she has to change this every second day although this is an improvement from what she used to do. She went to see Dr. Doran Durand who did not think she had a muscular issue with regards to her feet, he referred her to a neurologist and I think the appointment is sometime in June. I changed her back to Iodoflex which she has used in the past but not recently. 11/03/16; the patient has been using Iodoflex although she ran out of this. Still claims that there is a lot of drainage although the wound does not look like this. No surrounding erythema. She has not been back to see Dr. Marla Roe 11/24/16; the patient has been using Iodoflex again but she ran out of it 2 or 3 days ago. There is no major change in the condition of either one of these wounds in fact they  are larger and covered in a thick adherent surface slough/nonviable tissue especially on the left. She does not tolerate mechanical debridement in our clinic. Going back to see Dr. Marla Roe of plastic surgery for an operative  debridement would seem reasonable. 12/15/16; the patient has not been back to see Dr. Marla Roe. She is been dealing with a series of illnesses and her husband which of monopolized her time. She is been using Sorbact which we largely supplied. She states the drainage is bad enough that it maximum she can go 2-3 days without changing the dressing 01/12/2017 -- the patient has not been back for about 4 weeks and has not seen Dr. Marla Roe not does she have any appointment pending. 01/23/17; patient has not seen Dr. Marla Roe even though I suggested this previously. She is using Santyl that was suggested last week by Dr. Con Memos this Cost her $16 through her insurance which is indeed surprising 02/12/17; continuing Santyl and the patient is changing this daily. A lot of drainage. She has not been back to see plastic surgery she is using an Ace wrap. Our intake nurse suggested wrap around stockings which would make a good reasonable alternative 02/26/17; patient is been using Santyl and changing this daily due to drainage. She has not been to see plastic surgery she uses in April Ace wrap to control the edema. She did obtain extremitease stockings but stated that the edema in her leg was to big for these 03/20/17; patient is using Santyl and Anasept. Surfaces looked better today the area on the right is actually measuring a little smaller. She has states she has a lot of pain in her feet and ankles and is asking for a consult to pain control which I'll try to help her with through our case manager. 04/10/17; the patient arrives with better-looking wound surfaces and is slightly smaller wound on the left she is using a combination of Santyl and Anasept. She has an appointment or at least as started in the pain control center associated with [facility] 05/14/17; this is a patient who I followed for a prolonged period of time. 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 At one  point I had this down to a much smaller wound on the left however these reopened and we've never been able to get these to heal. She has been using Santyl and Anasept gel although 2 weeks ago she ran out of the Anasept gel. She has a stable appearance of the wound. She is going to the wound care clinic at [facility] They wanted do a nerve block/spinal block although she tells me she is reluctant to go forward with that. 05/21/17; this is a patient I have followed for many years. 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 Chronic pain and deformity in her ankles as well. She is been to see plastic surgery as well as orthopedics. Using PolyMem AG most recently/Kerramax/ABDs and 2 layer compression. She has managed to keep this on and she is coming in for a nurse check to change the dressing on Tuesdays, we see her on Fridays 06/05/17; really quite a good looking surface and the area especially on the right medial has contracted in terms of dimensions. Well granulated healthy-looking tissue on both sides. Even with an open curet there is nothing that even feels abnormal here. This is as good as I've seen this in quite some time. We have been using PolyMem AG and bringing her in for  a nurse check 06/12/17; really quite good surface on both of these wounds. The right medial has contracted a bit left is not. We've been using PolyMem and AG and she is coming in for a nurse visit 06/19/17; we have been using PolyMem AG and bringing her in for a nurse check. Dimensions of her wounds are not better but the surfaces looked better bilaterally. She complained of bleeding last night and the left wound and increasing pain bilaterally. She states her wound pain is more neuropathic than just the wounds. There was some suggestion that this was radicular from her pain management doctor in talking to her it is really difficult to sort this out. 06/26/17; using PolyMem and AG and bringing her  in for a nurse check as All of this and reasonably stable condition. Certainly not improved. The dimensions on the lateral part of the right leg look better but not really measuring better. The medial aspect on the left is about the same. 07/03/16; we have been using PolyMen AG and bringing her in for a nurse check to change the dressings as the wounds have drainage which precludes once weekly changing. We are using all secondary absorptive dressings.our intake nurse is brought up the idea of using a wound VAC/snap VAC on the wound to help with the drainage to see if this would result in some contraction. This is not a bad idea. The area on the right medial is actually looking smaller. Both wounds have a reasonable-looking surface. There is no evidence of cellulitis. The edema is well controlled 07/10/17; the patient was denied for a snap VAC by her insurance. The major issue with these wounds continues to be drainage. We are using wicked PolyMem AG and she is coming in for a nurse visit to change this. The wounds are stable to slightly improved. The surface looks vibrant and the area on the right certainly has shrunk in size but very slowly 07/17/17; the patient still has large wounds on her bilateral medial malleoli. Surface of both of these wounds looks better. The dimensions seem to come and go but no consistent improvement. There is no epithelialization. We do not have options for advanced treatment products due to insurance issues. They did not approve of the wound VAC to help control the drainage. More recently we've been using PolyMem and AG wicked to allow drainage through. We have been bringing her in for a nurse visit to change this. We do not have a lot of options for wound care products and the home again due to insurance issues 07/24/17; the patient's wound actually looks somewhat better today. No drainage measurements are smaller still healthy-looking surface. We used silver collagen under  PolyMen started last week. We have been bringing her in for a dressing change 07/31/17; patient's wound surface continued to look better and I think there is visible change in the dimensions of the wound on the right. Rims of epithelialization. We have been using silver collagen under PolyMen and bringing her in for a dressing change. There appears to be less drainage although she is still in need of the dressing change 08/07/17. Patient's wound surface continues to look better on both sides and the area on the right is definitely smaller. We have been using silver collagen and PolyMen. She feels that the drainage has been it has been better. I asked her about her vascular status. She went to see Dr. Aleda Grana at [facility] and had some form of ablation. I don't have  much detail on this. I haven't my notes from 2016 that she was not a candidate for any further ablation but I don't have any more information on this. We had referred her to vein and vascular I don't think she ever went. He does not have a history of PAD although I don't have any information on this either. We don't even have ABIs in our record 08/14/17; we've been using silver collagen and PolyMen cover. And putting the patient and compression. She we are bringing her in as a nurse visit to change this because ofarge amount of drainage. We didn't the ABIs in clinic today since they had been done in many moons 1.2 bilaterally. She has been to see vein and vascular however this was at [facility] and she had ablation although I really don't have any information on this all seemed biking get a report. She is also been operatively debrided by plastic surgery and had a cell placed probably 8-12 months ago. This didn't have a major effect. We've been making some gains with current dressings 08/19/17-She is here in follow-up evaluation for bilateral medial malleoli ulcers. She continues to tolerate debridement very poorly. We will continue  with recently changed topical treatment; if no significant improvement may consider switching to Iodosorb/Iodoflex. 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 She has been using silver collagen and PolyMem. I believe she has been used and tried on Iodoflex before. 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 These have been very refractory over time. We have been using silver collagen and PolyMen. She is coming in once a week for a doctor's and once a week for nurse visits. We are actually making some progress 09/18/17; the patient's wounds are smaller especially on the right medial. She arrives today to upset to consider even washing these off with Anasept which I think is been part of the reason this is been closing. We've been using collagen covered in PolyMen otherwise. It is noted that she has a small area of folliculitis on the right medial calf that. As we are wrapping her legs I'll give her a short course of doxycycline to make sure this doesn't amount to anything. She is a long list of complaints today including imbalance, shortness of breath on exertion, inversion of her left ankle. With regards to the latter complaints she is been to see orthopedics and they offered her a tendon release surgery I believe but wanted her wounds to be closed first. I have recommended she go see her primary physician with regards to everything else. 09/25/17; patient's wounds are about the same size. We have made some progress bilaterally although not in recent weeks. She will not allow me T wash these wounds with Anasept even if she is doing her cell. Wheeze we've been using collagen covered in PolyMen. Last week she had a small area of folliculitis this is now opened into a  small wound. She completed 5 days of trimethoprim sulfamethoxazole 10/02/17; unfortunately the area on her left medial ankle is worse with a larger wound area towards the Achilles. The patient complains of a lot of pain. She will not allow debridement although visually I don't think there is anything to debridement in any case. We have been using silver collagen and PolyMen for  several months now. Initially we are making some progress although I'm not really seeing that today. We will move back to [facility]. His admittedly this is a bit of a repeat however I'm hoping that his situation is different now. The patient tells me she had her leg on the left give out on her yesterday this is process some pain. 10/09/17; the patient is seen twice a week largely because of drainage issues coming out of the chronic medial bimalleolar wounds that are chronic. Last week the dimensions of the one on the left looks a little larger I changed her to [facility]. She comes in today with a history of terrible pain in the bilateral wound areas. She will not allow debridement. She will not even allow a tissue culture. There is no surrounding erythema no no evidence of cellulitis. We have been putting her Kerlix Coban man. She will not allow more aggressive compression as there was a suggestion to put her in 3 layer wraps. 10/16/17; large wounds on her bilateral medial malleoli. These are chronic. Not much change from last week. The surface looks have healthy but absolutely no epithelialization. A lot of pain little less so of drainage. She will not allow debridement or even washing these off in the vigorous fashion with Anasept. 10/23/17; large wounds on her bilateral malleoli which are chronic. Some improvement in terms of size perhaps on the right since last time I saw these. She states that after we increased the 3 layer compression there was some bleeding, when she came in for a nurse visit she did not want 3 layer  compression put back on about our nurse managed to convince her. She has known chronic venous visit issues and I'm hoping to get her to tolerate the 3 layer compression. using Hydrofera Blue 10/30/17; absolutely no change in the condition of either wound although we've had some improvement in dimensions on the right.. Attempted to put her in 3 layer compression she didn't tolerated she is back in 2 layer compression. We've been using Hydrofera Blue We looked over her past records. She had venous reflux studies in November 2016. There was no evidence of deep venous reflux on the right. Superficial vein did not show the greater saphenous vein at think this is been previously ablated the small saphenous vein was within normal limits. The left deep venous system showed no DVT the vessels were positive for deep venous reflux in the posterior tibial veins at the ankle. The greater saphenous vein was surgically absent small saphenous vein was within normal limits. She went to vein and vascular at [facility]. I believe she had an ablation on the left greater saphenous vein. I'll update her reflux studies perhaps ever reviewed by vein and vascular. We've made absolutely no progress in these wounds. Will also try to read and TheraSkins through her insurance 11/06/17; W the patient apparently has a 2 week follow-up with vein and vascular I like him to review the whole issue with regards to her previous vascular workup by Dr. Aleda Grana. We've really made no progress on these wounds in many months. She arrives today with less viable looking surface on the left medial ankle wound. This was apparently looking about the same on Tuesday when she was here for nurse visit. 11/13/17; deep tissue culture I did last time of the left lower leg showed multiple organisms without any predominating. In particular no Staphylococcus or group A strep were isolated. We sent her for venous reflux studies.  She's had a previous  left greater saphenous vein stripping and I think sclerotherapy of the right greater saphenous vein. She didn't really look at the lesser saphenous vein this both wounds are on the medial aspect. She has reflux in the common femoral vein and popliteal vein and an accessory vein on the right and the common femoral vein and popliteal vein on the left. I'm going to have her go to see vein and vascular just the look over things and see if anything else beside aggressive compression is indicated here. We have not been able to make any progress on these wounds in spite of the fact that the surface of the wounds is never look too bad. 11/20/17; no major change in the condition of the wounds. Patient reports a large amount of drainage. She has a lot of complaints of pain although enlisting her today I wonder if some of this at least his neuropathic rather than secondary to her wounds. She has an appointment with vein and vascular on 12/30/17. The refractory nature of these wounds in my mind at least need vein and vascular to look over the wounds the recent reflux studies we did and her history to see if anything further can be done here. I also note her gait is deteriorated quite a bit. 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 She has a bilateral Trendelenburg gait. I wonder if this is neuropathic or perhaps multilevel radicular. 11/27/17; her wounds actually looks slightly better. Healthy-looking granulation tissue a scant amount of epithelialization. [place] healthcare will not pay for [employer]. They will play for tri layer Oasis and Dermagraft. This is not a diabetic ulcer. We'll try for the tri layer Oasis. She still complains of some drainage. She has a vein and vascular appointment on 12/30/17 12/04/17; the wounds visually look quite good. Healthy-looking granulation with some degree of epithelialization. We are still waiting for response to our request for trial to try layer Oasis. Her  appointment with vascular to review venous and arterial issues isn't sold the end of July 7/31. Not allow debridement or even vigorous cleansing of the wound surface. 12/18/17; slightly smaller especially on the right. 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 We've been using Hydrofera Blue. We still are looking into triple layer Oasis through her insurance 01/08/18 on evaluation today patient's wound actually appears to be showing signs of good improvement at this point in time. She has been tolerating the dressing changes without complication. Fortunately there does not appear to be any evidence of infection at this point in time. We have been utilizing silver nitrate which does seem to be of benefit for her which is also good news. Overall I'm very happy with how things seem to be both regards appearance as well as measurement. Patient did see Dr. Bridgett Larsson for evaluation on 12/30/17. In his assessment he felt that stripping would not likely add much more than chronic compression to the patient's healing process. His recommendation was to follow-up in three months with Dr. Doren Custard if she hasn't healed in order to consider referral back to you and see vascular where she previously was in a trial and was able to get her wound to heal. I'll be see what she feels she when you staying compression and he reiterated this as well. 01/13/18 on evaluation today patient appears to actually be doing very well in regard to her bilateral medial malleolus ulcers. She seems to have tolerated the chemical cauterization with silver  nitrate last week she did have some pain through that evening but fortunately states that I'll be see since it seems to be doing better she is overall pleased with the progress. 01/21/18; really quite a remarkable improvement since I've last seen these wounds. We started using silver nitrate specially on the islands of hyper granulation which for some reason her around  the wound circumference. This is really done quite nicely. Primary dressing Hydrofera Blue under 4 layer compression. She seems to be able to hold out without a nurse rewrap. Follow-up in 1 week 01/28/18; we've continued the hydrofera blue but continued with chemical cauterization to the wound area that we started about a month ago for irregular hyper granulation. She is made almost stunning improvement in the overall wound dimensions. I was not really expecting this degree of improvement in these chronic wounds 02/05/18; we continue with Hydrofera Bluebut of also continued the aggressive chemical cauterization with silver nitrate. We made nice progress with the right greater than left wound. 02/12/18. We continued with Hydrofera Blue after aggressive chemical cauterization with silver nitrate. We appear to be making nice progress with both wound areas 02/19/2018; we continue with [facility] after washing the wounds vigorously with Anasept spray and chemical cauterization with silver nitrate. We are making excellent progress. The area on the right's just about closed 02/26/2018. The area on the left medial ankle had too much necrotic debris today. I used a #5 curette we are able to get most of the soft. I continued with the silver nitrate to the much smaller wound on the right medial ankle she had a new area on her right lower pretibial area which she says was due to a role in her compression 03/05/2018; both wound areas look healthy. Not much change in dimensions from last week. I continue to use silver nitrate and Hydrofera Blue. The patient saw Dr. Doren Custard of vein and vascular. He felt she had venous stasis ulcers. He felt based on her previous arteriogram she should have adequate circulation for healing. Also she has deep venous reflux but really no significant correctable superficial venous reflux at this time. He felt we should continue with conservative management including leg elevation and  compression 04/02/2018; since we last saw this woman about a month ago she had a fall apparently suffered a pelvic fracture. I did not look up the x-ray. Nevertheless because of pain she literally was bedbound for 2 weeks and had home health coming out to change the dressing. Somewhat predictably this is resulted in considerable improvement in both wound areas. The right is just about closed on the medial malleolus and the left is about half the size. 04/16/2018; both her wounds continue to go down in size. Using Hydrofera Blue. 05/07/18; both her wounds appeared to be improving especially on the right where it is almost closed. We are using Hydrofera Blue 05/14/2018; slightly worse this week with larger wounds. Surface on the left medial not quite as good. We have been using Hydrofera Blue 05/21/18; again the wounds are slightly larger. Left medial malleolus slightly larger with eschar around the circumference. We have been using Hydrofera Blue undergoing a wraps for a prolonged period of time. This got a lot better when she was more recumbent due to a fall and a back injury. I change the primary dressing the silver alginate today. She did not tolerate a 4 layer compression previously although I may need to bring this up with her next time 05/28/2018; area on the  left medial malleolus again is slightly larger with more drainage. Area on the right is roughly unchanged. She has a small area of folliculitis on the right medial just on the lower calf. This does not look ominous. 06/03/2018 left medial malleolus slightly smaller in a better looking surface. We used silver nitrate on this last time with silver alginate. The area on the right appears slightly smaller 1/10; left medial malleolus slightly smaller. Small open area on the right. We used silver nitrate and silver alginate as of 2 weeks ago. We continue with the wound and compression. These got a lot better when she was off her feet 1/17; right  medial malleolus wound is smaller. The left may be slightly smaller. Both surfaces look somewhat better. 1/24; both wounds are slightly smaller. Using silver alginate under Unna boots 1/31; both wounds appear smaller in fact the area on the right medial is just about closed. Surface eschar. We have been using silver alginate under Unna boots. The patient is less active now spends let much less time on her feet and I think this is contributed to the general improvement in the wound condition 2/7; both wounds appear smaller. I was hopeful the right medial would be closed however there there is still the same small open area. Slight amount of surface eschar on the left the dimensions are smaller there is eschar but the wound edges appear to be free. We have been using silver alginate under Unna boot's 2/14; both wounds once again measure smaller. Circumferential eschar on the left medial. We have been using silver alginate under Unna boots with gradual improvement 2/21; the area on the right medial malleolus has healed. The area on the left is smaller. We have been using silver alginate and Unna boots. We can discharge wrapping the right leg she has 20/30 stockings at home she will need to protect the scar tissue in this area 2/28; the area on the right medial malleolus remains closed the patient has a compression stocking. The area on the left is smaller. We have been using silver alginate and Unna boots. 3/6 the area on the right medial ankle remains closed. Good edema control noted she is using her own compression stocking. The area on the left medial ankle is smaller. We have been managing this with silver alginate and Unna boots which we will continue today. 3/13; the area on the right medial ankle remains closed and I'm declaring it healed today. When necessary the left is about the same still a healthy-looking surface but no major change and wound area. No evidence of infection and using silver  alginate under unna and generally making considerable improvement 3/27 the area on the right medial ankle remains closed the area on the left is about the same as last week. Certainly not any worse we have been using silver alginate under an Unna boot 4/3; the area on the right medial ankle remains closed per the patient. We did not look at this wound. The wound on the left medial ankle is about the same surface looks healthy we have been using silver alginate under an Unna boot 4/10; area on the right medial ankle remains closed per the patient. We did not look at this wound. The wound on the left medial ankle is slightly larger. The patient complains that the [name] caused burning pain all week. She also told us that she was a lot more active this week. Changed her back to silver alginate 4/17; right medial  ankle still closed per the patient. Left medial ankle is slightly larger. Using silver alginate. She did not tolerate Hydrofera Blue on this area 4/24; right medial ankle remains closed we have not look at this. The left medial ankle continues to get larger today by about a centimeter. We have been using silver alginate under Unna boots. She complains about 4 layer compression as an alternative. She has been up on her feet working on her garden 5/8; right medial ankle remains closed we did not look at this. The left medial ankle has increased in size about 100%. We have been using silver alginate under Unna boots. She noted increased pain this week and was not surprised that the wound is deteriorated 5/15; no major change in SA however much less erythema ( one week of doxy ocellulitis). 5/22-65 year old female returns at 1 week to the clinic for left medial ankle wound for which we have been using silver alginate under 3 layer compression She was placed on DOXY at last visit - the wound is wider at this visit. She is in 3 layer compression 5/29; change to [facility] last week. I had  given her empiric doxycycline 2 weeks ago for a week. She is in 3 layer compression. She complains of a lot of pain and drainage on presentation today. 6/5; using Hydrofera Blue. I gave her doxycycline recently empirically for erythema and pain around the wound. Believe her cultures showed enterococcus which not would not have been well covered by doxycycline nevertheless the wound looks better and I don't feel specifically that the enterococcus needs to be covered. She has a new what looks like a wrap injury on her lateral left ankle. 6/12; she is using Hydrofera Blue. She has a new area on the left anterior lower tibial area. This was a wrap injury last week. 6/19; the patient is using Hydrofera Blue. She arrived with marked inflammation and erythema around the wound and tenderness. 12/01/18 on evaluation today patient appears to be doing a little bit better based on what I'm hearing from the standpoint of lassos evaluation to this as far as the overall appearance of the wound is concerned. Then sometime substandard she typically sees Dr. Dellia Nims. Nonetheless overall very pleased with the progress that she's made up to this point. No fevers, chills, nausea, or vomiting noted at this time. 7/10; some improvement in the surface area. Aggressively debrided last week apparently. I went ahead with the debridement today although the patient does not tolerate this very well. We have been using Iodoflex. Still a fair amount of drainage 7/17; slightly smaller. Using Iodoflex. 7/24; no change from last week in terms of surface area. We have been using Iodoflex. Surface looks and continues to look somewhat better 7/31; surface area slightly smaller better looking surface. We have been using Iodoflex. This is under Unna boot compression 8/7-Patient presents at 1 week with Unna boot and Iodoflex, wound appears better 8/14-Patient presents at 1 week with Iodoflex, we use the Unna boot, wound appears to be stable  better.Patient is getting Botox treatment for the inversion of the foot for tendon release, Next week 8/21; we are using Iodoflex. Unna boot. The wound is stable in terms of surface area. Under illumination there is some areas of the wound that appear to be either epithelialized or perhaps this is adherent slough at this point I was not really clear. It did not wipe off and I was reluctant to debride this today. 8/28; we are using Iodoflex  in an Haematologist. Seems to be making good improvement. 9/4; using Iodoflex and wound is slightly smaller. 9/18; we are using Iodoflex with topical silver nitrate when she is here. The wound continues to be smaller 10/2; patient missed her appointment last week due to GI issues. She left and Iodoflex based dressing on for 2 weeks. Wound is about the same size about the size of a dime on the left medial lower 10/9 we have been using Iodoflex on the medial left ankle wound. She has a new superficial probable wrap injury on the dorsal left ankle 10/16; we have been using Hydrofera Blue since last week. This is on the left medial ankle 10/23; we have been using Hydrofera Blue since 2 weeks ago. This is on the left medial ankle. Dimensions are better 11/6; using Hydrofera Blue. I think the wound is smaller but still not closed. Left medial ankle 11/13; we have been using Hydrofera Blue. Wound is certainly no smaller this week. Also the surface not as good. This is the remanent of a very large area on her left medial ankle. 11/20; using Sorbact since last week. Wound was about the same in terms of size although I was disappointed about the surface debris 12/11; 3-week follow-up. Patient was on vacation. Wound is measuring slightly larger we have been using Sorbact. 12/18; wound is about the same size however surface looks better last week after debridement. We have been using Sorbact under compression 1/15 wound is probably twice the size of last time increased in length  nonviable surface. We have been using Sorbact. She was running a mild fever and missed her appointment last week 1/22; the wound is come down in size but under illumination still a very adherent debris we have been Hydrofera Blue that I changed her to last week 1/29; dimensions down slightly. We have been using Hydrofera Blue 2/19 dimensions are the same however there is rims of epithelialization under illumination. Therefore more the surface area may be epithelialized 2/26; the patient's wound actually measures smaller. The wound looks healthy. We have been using Hydrofera Blue. I had some thoughts about running Apligraf then I still may do that however this looks so much better this week we will delay that for now 3/5; the wound is small but about the same as last week. We have been using Hydrofera Blue. No debridement is required today. 3/19; the wound is about the size of a dime. Healthy looking wound even under illumination. We have been using Hydrofera Blue. No mechanical debridement is necessary 3/26; not much change from last week although still looks very healthy. We have been using Hydrofera Blue under Unna boots Patient was offered an ankle fusion by podiatry but not until the wound heals with a proceed with this. 4/9; the patient comes in today with her original wound on the medial ankle looking satisfactory however she has some uncontrolled swelling in the middle part of her leg with 2 new open areas superiorly just lateral to the tibia. I think this was probably a wrap issue. She said she felt uncomfortable during the week but did not call in. We have been using Hydrofera Blue 4/16; the wound on the medial ankle is about the same. She has innumerable small areas superior to this across her mid tibia. I think this is probably folliculitis. She is also been working in the yard doing a lot of sweating 4/30; the patient issue on the upper areas across her mid tibia of all  healed. I think  this was excessive yard work if I remember. Her wound on the medial ankle is smaller. Some debris on this we have been using Hydrofera Blue under Unna boots 5/7; mid tibia. She has been using Hydrofera Blue under an Unna wrap. She is apparently going for her ankle surgery on June 3 10/28/19-Patient returns to clinic with the ankle wound, we are using Hydrofera Blue under Unna wrap, surgery is scheduled for her left foot for June 3 so she will be back for nurse visit next week READMISSION 01/17/2020 Mrs. Carle is a 65 year old woman we have had in this clinic for a long period of time with severe venous hypertension and refractory wounds on her medial lower legs and ankles bilaterally. 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 When she was off her legs for a prolonged period example when she fell and suffered a compression fracture things would heal up quite nicely. She is now retired and we managed to heal up the right medial leg wound. The left one was very tiny last time I saw this although still refractory. 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 It got to the point where this was interfering with her walking and she elected to proceed with a ankle arthrodesis to straighten her her ankle and leave her with a functional outcome for mobilization. The patient was referred to Dr. Doren Custard and really this took some time to arrange. Dr. Doren Custard saw her on 12/07/2019. Once again he verified that she had no arterial issues. She had previously had an angiogram several years ago. Follow-up ABIs on the left showed an ABI of 1.12 with triphasic waveforms and a TBI of 0.92. She is felt to have chronic deep venous insufficiency but I do not think it was felt that anything could be done from about this from  an ablation point of view. At the time Dr. Doren Custard saw this patient the wounds actually look closed via the pictures in his clinic. The patient finally underwent her surgery on 12/15/2019. This went reasonably well and there was a good anatomic outcome. She developed a small distal wound dehiscence on the lateral part of the surgical wound. However more problematically she is developed recurrence of the wound on the medial left ankle. There are actually 2 wounds here one in the distal lower leg and 1 pretty much at the level of the medial malleolus. It is a more distal area that is more problematic. She has been using Hydrofera Blue which started on Friday before this she was simply Ace wrapping. There was a culture done that showed Pseudomonas and she is on ciprofloxacin. A recent CNS on 8/11 was negative. The patient reports some pain but I generally think this is improving. She is using a cam boot completely nonweightbearing using a walker for pivot transfers and a wheelchair 8/24; not much improvement unfortunately she has a surgical wound on the lateral part in the venous insufficiency wound medially. The bottom part of the medial insufficiency wound is still necrotic there is exposed tendon here. We have been using Hydrofera Blue under compression. Her edema control is however better 8/31; patient in for follow-up of his surgical wound on the lateral part of her left leg and chronic venous insufficiency ulcers medially. We  put her back in compression last week. She comes in today with a complaint of 3 or 4 days worth of increasing pain. She felt her cam walker was rubbing on the area on the back of her heel. However there is intense erythema seems more likely she has cellulitis. She had 2 cultures done when she was seeing podiatry in the postop. One of them in late July showed Pseudomonas and she received a course of ciprofloxacin the other was negative on 8/11 she is allergic to penicillin  with anaphylactoid complaints of hives oral swelling via information in epic 9/9; when I saw this patient last week she had intense anterior erythema around her wound on the right lateral heel and ankle and also into the right medial heel. Some of this was no doubt drainage and her walker boot however I was convinced she had cellulitis. I gave her Levaquin and Bactrim she is finishing up on this now. She is following up with Dr. [name] he saw her yesterday. He is taken her out of the walking boot of course she is still nonweightbearing. Her x-ray was negative for any worrisome features such as soft tissue air etc. Things are a lot better this week. She has home health. We have been using Hydrofera Blue under an [employer] which she put back on yesterday. I did not wrap her last week 9/17; her surrounding skin looks a lot better. In fact the area on the left lateral ankle has just a scant amount of eschar. The only remaining wound is the large area on the left medial ankle. Probably about 60% of this is healthy granulation at the surface however she has a significant divot distally. This has adherent debris in it. I been using debridement and silver collagen to try and get this area to fill-in although I do not think we have made much progress this week 9/24; the patient's wound on the left medial ankle looks a lot better. The deeper divot area distally still requires debridement but this is cleaning up quite nicely we have been using silver collagen. The patient is complaining of swelling in her foot and is worried that that is contributing to the nonhealing of the ankle wound. She is also complaining of numbness in her anterior toes 10/4; left medial ankle. The small area distally still has a divot with necrotic material that I have been debriding away. This has an undermining area. She is approved for Apligraf. She saw Dr. [name] her surgeon on 10/1. I think he declared himself is satisfied with the  condition of things. Still nonweightbearing till the end of the month. 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 Her surgical wound is well closed. There is no evidence of infection 10/11; the patient arrived in clinic today with the expectation that we be able to put an Apligraf on this area after debridement however she arrives with a relatively large amount of green drainage on the dressing. The patient states that this started on Friday. She has not been systemically unwell. 10/19; culture I did last week showed both Enterococcus and Pseudomonas. I think this came in separate parts because I stopped her ciprofloxacin I gave her and prescribed her linezolid however now looking at the final culture result this is Pseudomonas which is resistant to quinolones. She has not yet picked up the linezolid apparently phone issues. We are also trying to get a topical antibiotic out of [place] in [place] they can  be applied by home health. She is still having green drainage 10/16; the patient has her topical antibiotic from [name] in [place]. This is a compounded gel with vancomycin and ciprofloxacin and gentamicin. We are applying this on the wound bed with silver alginate over the top with Unna boot wraps. She arrives in clinic today with a lot less ominous looking drainage although she is only use this topical preparation once the second time today. She sees Dr. [name] her surgeon on Friday she has home health changing the dressing 11/2; still using her compounded topical antibiotic under silver alginate. Surface is cleaning up there is less drainage. We had an Apligraf for her today and I elected to apply it. A light coating of her antibiotic 04/25/2020 upon evaluation today patient appears to be doing well with regard to her ankle ulcer. There is a little bit of slough noted on the surface of the wound I am can have to perform sharp debridement to clear  this away today. With that being said other than that fact overall I feel like she is making progress and we do see some new epithelial growth. There is also some improvement in the depth of the wound and that distal portion. There is little bit of slough there as well. 12/7; 2-week follow-up. Apligraf #3. Dimensions are smaller. Closing in especially inferiorly. Still some surface debris. Still using the [facility] topical antibiotic but I told her that I don't think this needs to be renewed 12/21; 2-week follow-up. Apligraf #4 dimensions are smaller. Nice improvement 06/05/2020; 2-week follow-up. The patient's wound on the left medial ankle looks really excellent. Nice granulation. Advancing epithelialization no undermining no evidence of infection. We would have to reapply for another Apligraf but with the condition of this wound I did not feel strongly about it. We used Hydrofera Blue under the same degree of compression. She follows up with Dr. [name] her surgeon a week Friday 06/13/2020 upon evaluation today patient appears to be doing excellent in regard to her wound. She has been tolerating the dressing changes without complication. Fortunately there is no signs of active infection at this time. No fevers, chills, nausea, vomiting, or diarrhea. She was using Hydrofera Blue last week. 06/20/2020 06/20/2020 on evaluation today patient actually appears to be doing excellent in regard to her wound. This is measuring better and looking much better as well. She has been using the collagen that seems to be doing better for her as well even though the [facility] was and is not sticking or feeling as rough on her wound. She did see Dr. [name] on Friday he is very pleased he also stated none of the hardware has shifted. That is great news 1/27; the patient has a small clean wound all that is remaining. I agree that this is too small to really consider an Apligraf. Under illumination the surface is looking  quite good. We have been using collagen although the dimensions are not any better this week 2/2; the patient has a small clean wound on the left medial ankle. Although this left of her substantial original areas. Measurements are smaller. We have been using polymen Ag under an Haematologist. 2/10; small area on the left medial ankle. This looks clean nothing to debride however dimensions are about the same we have been using polymen I think now for 2 weeks 2/17; not much change in surface area. We have been using polymen Ag without any improvement. 3/17; 1 month follow-up. The patient  has been using endoform without any improvement in fact I think this looks worse with more depth and more expansion Electronic Signature(s) Signed: 08/16/2020 5:46:09 PM By: Linton Ham MD Entered By: Linton Ham on 08/16/2020 12:35:52 -------------------------------------------------------------------------------- Physical Exam Details Patient Name: Date of Service: [name], [name] G. 08/16/2020 9:45 A M Medical Record Number: [id number] Patient Account Number: [id number] Date of Birth/Sex: Treating RN: 09/30/1955 (65 y.o. [name] Primary Care Provider: [name] Other Clinician: Referring Provider: Treating Provider/Extender: [name] in Treatment: 30 Constitutional Sitting or standing Blood Pressure is within target range for patient.. Pulse regular and within target range for patient.Marland Kitchen Respirations regular, non-labored and within target range.. Temperature is normal and within the target range for the patient.Marland Kitchen Appears in no distress. Notes Wound exam; I did a surface debridement of adherent fibrinous debris. Also skin overhanging along the wound edge Electronic Signature(s) Signed: 08/16/2020 5:46:09 PM By: Linton Ham MD Entered By: Linton Ham on 08/16/2020  12:37:57 -------------------------------------------------------------------------------- Physician Orders Details Patient Name: Date of Service: [name], [name] G. 08/16/2020 9:45 A M Medical Record Number: [id number] Patient Account Number: [id number] Date of Birth/Sex: Treating RN: Mar 15, 1956 (65 y.o. [name], Meta.Reding Primary Care Provider: [name] Other Clinician: Referring Provider: Treating Provider/Extender: [name] in Treatment: 30 Verbal / Phone Orders: No Diagnosis Coding ICD-10 Coding Code Description [phone] Chronic venous hypertension (idiopathic) with ulcer and inflammation of left lower extremity L97.828 Non-pressure chronic ulcer of other part of left lower leg with other specified severity L97.328 Non-pressure chronic ulcer of left ankle with other specified severity Follow-up Appointments Return Appointment in 1 week. Bathing/ Shower/ Hygiene May shower with protection but do not get wound dressing(s) wet. Edema Control - Lymphedema / SCD / Other Elevate legs to the level of the heart or above for 30 minutes daily and/or when sitting, a frequency of: Avoid standing for long periods of time. Exercise regularly Compression stocking or Garment 20-30 mm/Hg pressure to: - right leg daily Additional Orders / Instructions Other: - Apply triple antibiotic ointment to redden, scabbed areas to anterior area of left lower leg. Wound Treatment Wound #15 - Malleolus Wound Laterality: Left, Medial Cleanser: Soap and Water 1 x Per Week/15 Days Discharge Instructions: May shower and wash wound with dial antibacterial soap and water prior to dressing change. Peri-Wound Care: Sween Lotion (Moisturizing lotion) 1 x Per Week/15 Days Discharge Instructions: Apply moisturizing lotion as directed Prim Dressing: Cutimed Sorbact Swab 1 x Per Week/15 Days ary Discharge Instructions: Apply to wound bed as instructed Prim Dressing: Endoform 2x2 in 1 x  Per Week/15 Days ary Discharge Instructions: Moisten with saline Secondary Dressing: Woven Gauze Sponge, Non-Sterile 4x4 in (Home Health) 1 x Per Week/15 Days Discharge Instructions: Apply over primary dressing as directed. Secondary Dressing: ABD Pad, 5x9 (Home Health) 1 x Per Week/15 Days Discharge Instructions: Apply over primary dressing as directed. Compression Wrap: Unnaboot w/Calamine, 4x10 (in/yd) (Home Health) 1 x Per Week/15 Days Discharge Instructions: Apply Unnaboot as directed. Electronic Signature(s) Signed: 08/16/2020 5:46:09 PM By: Linton Ham MD Signed: 08/16/2020 5:53:43 PM By: Deon Pilling Entered By: Deon Pilling on 08/16/2020 11:05:22 -------------------------------------------------------------------------------- Problem List Details Patient Name: Date of Service: [name], [name] G. 08/16/2020 9:45 A M Medical Record Number: [id number] Patient Account Number: [id number] Date of Birth/Sex: Treating RN: Jul 07, 1955 (65 y.o. 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 08/16/2020 9:45 A M Medical Record Number: [id number] Patient Account Number: [id number] Date of Birth/Sex: Treating RN: 1955/06/04 (65 y.o. [name] Primary Care Provider: [name] Other Clinician: Referring Provider: Treating Provider/Extender: [name] in Treatment: 30 Subjective History of Present Illness (HPI) the remaining wound is over the left medial ankle. Similar wound over the right medial ankle healed largely with use of Apligraf. Most recently we have been using Hydrofera Blue over this wound with considerable improvement. The patient has been extensively worked up in the past for her venous insufficiency and she is not a candidate for antireflux surgery although I have none of the details available currently. 08/24/14; considerable improvement today. About 50% of this wound areas now epithelialized. The base of the wound appears to be healthier granulation.as opposed to last week when she had deteriorated a considerable improvement 08/17/14; unfortunately the wound has regressed somewhat. The areas of epithelialization from the superior aspect are not nearly as healthy as they were last week. The patient thinks her Hydrofera Blue slipped. 09/07/14; unfortunately the area has markedly regressed in the 2 weeks since I've seen this. There is an odor surrounding erythema. The healthy granulation tissue that we had at the base of the wound now is a dusky color. The nurse reports green drainage 09/14/14; the area looks somewhat better than last week. There is less erythema and less drainage. The culture I did did not show any growth. Nevertheless I think it is better to continue the Cipro and doxycycline for a further week. The remaining wound area was  debridement. 09/21/14. Wound did not require debridement last week. Still less erythema and less drainage. She can complete her antibiotics. The areas of epithelialization in the superior aspect of the wound do not look as healthy as they did some weeks ago 10/05/14 continued improvement in the condition of this wound. There is advancing epithelialization. Less aggressive debridement required 10/19/14 continued improvement in the condition and volume of this wound. Less aggressive debridement to the inferior part of this to remove surface slough and fibrinous eschar 11/02/14 no debridement is required. The surface granulation appears healthy although some of her islands of epithelialization seem to have regressed. No evidence of infection 11/16/14; lites surface debridement done of surface eschar. The wound does not look to be unhealthy. No evidence of infection. Unfortunately the patient has had podiatry issues in the right foot and for some reason has redeveloped small surface ulcerations in the medial right ankle. Her original presentation involved wounds in this area 11/23/14 no debridement. The area on the right ankle has enlarged. The left ankle wound appears stable in terms of the surface although there is periwound inflammation. There has been regression in the amount of new  skin 11/30/14 no debridement. Both wound areas appear healthy. There was no evidence of infection. The the new area on the right medial ankle has enlarged although that both the surfaces appear to be stable. 12/07/14; Debridement of the right medial ankle wound. No no debridement was done on the left. 12/14/14 no major change in and now bilateral medial ankle wounds. Both of these are very painful but the no overt evidence of infection. She has had previous venous ablation 12/21/14; patient states that her right medial ankle wound is considerably more painful last week than usual. Her left is also somewhat painful. She could  not tolerate debridement. The right medial ankle wound has fibrinous surface eschar 12/28/14 this is a patient with severe bilateral venous insufficiency ulcers. For a considerable period of time we actually had the one on the right medial ankle healed however this recently opened up again in June. The left medial ankle wound has been a refractory area with some absent flows. We had some success with Hydrofera Blue on this area and it literally closed by 50% however it is recently opened up Foley. Both of these were debridement today of surface eschar. She tolerates this poorly 01/25/15: No change in the status of this. Thick adherent escar. Very poor tolerance of any attempt at debridement. I had healed the right medial malleolus wound for a considerable amount of time and had the left one down to about 50% of the volume although this is totally regressed over the last 48 weeks. Further the right leg has reopened. she is trying to make a appointment with pain and vascular, previous ablations with Dr. Aleda Grana. I do not believe there is an arterial insufficiency issue here 02/01/15 the status of the adherent eschar bilaterally is actually improved. No debridement was done. She did not manage to get vascular studies done 02/08/15 continued debridement of the area was done today. The slough is less adherent and comes off with less pressure. There is no surrounding infection peripheral pulses are intact 02/15/15 selective debridement with a disposable curette. Again the slough is less adherent and comes off with less difficulty. No surrounding infection peripheral pulses are intact. 02/22/15 selective debridement of the right medial ankle wound. Slough comes off with less difficulty. No obvious surrounding infection peripheral pulses are intact I did not debridement the one on the left. Both of these are stable to improved 03/01/15 selective debridement of both wound areas using a curette to. Adherent  slough cup soft with less difficulty. No obvious surrounding infection. The patient tells me that 2 days ago she noted a rash above the right leg wrap. She did not have this on her lower legs when she change this over she arrives with widespread left greater than right almost folliculitis-looking rash which is extremely pruritic. I don't see anything to culture here. There is no rash on the rest of her body. She feels well systemically. 03/08/15; selective debridement of both wounds using a curette. Base of this does not look unhealthy. She had limegreen drainage coming out of the left leg wound and describes a lot of drainage. The rash on her left leg looks improved to. No cultures were done. 03/22/15; patient was not here last week. Basal wounds does not look healthy and there is no surrounding erythema. No drainage. There is still a rash on the left leg that almost looks vasculitic however it is clearly limited to the top of where the wrap would be. 04/05/15; on the right required  a surgical debridement of surface eschar and necrotic subcutaneous tissue. I did not debridement the area on the left. These continue to be large open wounds that are not changing that much. We were successful at one point in healing the area on the right, and at the same time the area on the left was roughly half the size of current measurements. I think a lot of the deterioration has to do with the prolonged time the patient is on her feet at work 04/19/15 I attempted-like surface debridement bilaterally she does not tolerate this. She tells me that she was in allergic care yesterday with extreme pain over her left lateral malleolus/ankle and was told that she has an "sprain" 05/03/15; large bilateral venous insufficiency wounds over the medial malleolus/medial aspect of her ankles. She complains of copious amounts of drainage and his usual large amounts of pain. There is some increasing erythema around the wound on the  right extending into the medial aspect of her foot to. historically she came in with these wounds the right one healed and the left one came down to roughly half its current size however the right one is reopened and the left is expanded. 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 05/10/15 large bilateral venous insufficiency wounds. There is less adherence surface left however the surface culture that I did last week grew pseudomonas therefore bilateral selective debridement score necessary. There is surrounding erythema. The patient describes severe bilateral drainage and a lot of pain in the left ankle. Apparently her podiatrist was were ready to do a cortisone shot 05/17/15; the patient complains of pain and again copious amounts of drainage. 05/24/15; we used Iodo flex last week. Patient notes considerable improvement in wound drainage. Only needed to change this once. 05/31/15; we continued Iodoflex; the base of these large wounds bilaterally is not too bad but there is probably likely a significant bioburden here. I would like to debridement just doesn't tolerate it. 06/06/14 I would like to continue the Iodoflex although she still hasn't managed to obtain supplies. She has bilateral medial malleoli or large wounds which are mostly superficial. Both of them are covered circumferentially with some nonviable fibrinous slough although she tolerates debridement very poorly. She apparently has an appointment for an ablation on the right leg by interventional radiology. 06/14/15; the patient arrives with the wounds and static condition. We attempted a debridement although she does not do well with this secondary to pain. I 07/05/15; wounds are not much smaller however there appears to be a cleaner granulating base. The left has tight fibrinous slough greater than the right. Debridement is tolerated poorly due to pain. Iodoflex is done more for these wounds  in any of the multitude of different dressings I have tried on the left 1 and then subsequently the right. 07/12/15; no change in the condition of this wound. I am able to do an aggressive debridement on the right but not the left. She simply cannot tolerate it. We have been using Iodoflex which helps somewhat. It is worthwhile remembering that at one point we healed the right medial ankle wound and the left was about 25% of the current circumference. We have suggested returning to vascular surgery for review of possible further ablations for one reason or another she has not been able to do this. 07/26/15 no major change in the condition of either wound on her medial ankle. I did  not attempt to debridement of these. She has been aggressively scrubbing these while she is in the shower at home. She has her supply of Iodoflex which seems to have done more for these wounds then anything I have put on recently. 08/09/15 wound area appears larger although not verified by measurements. Using Iodoflex 09/05/2015 -- she was here for avisit today but had significant problems with the wound and I was asked to see her for a physician opinion. I have summarize that this lady has had surgery on her left lower extremity about 10 years ago where the possible veins stripping was done. She has had an opinion from interventional radiology around November 2016 where no further sclerotherapy was ordered. The patient works 12 hours a day and stands on a concrete floor with work boots and is unable to get the proper compression she requires and cannot elevate her limbs appropriately at any given time. She has recently grown Pseudomonas from her wound culture but has not started her ciprofloxacin which was called in for her. 09/13/15 this continues to be a difficult situation for this patient. At one point I had this wound down to a 1.5 x 1.5" wound on her left leg. This is deteriorated and the right leg has reopened. She now  has substantial wounds on her medial calcaneus, malleoli and into her lower leg. One on the left has surface eschar but these are far too painful for me to debridement here. She has a vascular surgery appointment next week to see if anything can be done to help here. I think she has had previous ablations several years ago at [facility]. She has no major edema. She tells me that she did not get product last time [name]] and went for several days without it. She continues to work in work boots 12 hours a day. She cannot get compression/4-layer under her work boots. 09/20/15 no major change. Periwound edema control was not very good. Her point with pain and vascular is next Wednesday the 25th 09/28/15; the patient is seen vascular surgery and is apparently scheduled for repeat duplex ultrasounds of her bilateral lower legs next week. 10/05/15; the patient was seen by Dr. Doren Custard of vascular surgery. He feels that she should have arterial insufficiency excluded as cause/contributed to her nonhealing stage she is therefore booked for an arteriogram. She has apparently monophasic signals in the dorsalis pedis pulses. She also of course has known severe chronic venous insufficiency with previous procedures as noted previously. I had another long discussion with the patient today about her continuing to work 12 hour shifts. I've written her out for 2 months area had concerns about this as her work location is currently undergoing significant turmoil and this may lead to her termination. She is aware of this however I agree with her that she simply cannot continue to stand for 12 hours multiple days a week with the substantial wound areas she has. 10/19/15; the Dr. Doren Custard appointment was largely for an arteriogram which was normal. She does not have an arterial issue. He didn't make a comment about her chronic venous insufficiency for which she has had previous ablations. Presumably it was not felt that  anything additional could be done. The patient is now out of work as I prescribed 2 weeks ago. Her wounds look somewhat less aggravated presumably because of this. I felt I would give debridement another try today 10/25/15; no major change in this patient's wounds. We are struggling to get her  product that she can afford into her own home through her insurance. 11/01/15; no major change in the patient's wounds. I have been using silver alginate as the most affordable product. I spoke to Dr. Marla Roe last week with her requested take her to the OR for surgical debridement and placement of ACEL. Dr. Marla Roe told me that she would be willing to do this however [facility] will not cover this, fortunately the patient has [place] healthcare of some variant 11/08/15; no major change in the patient's wounds. She has been completely nonviable surface that this but is in too much pain with any attempted debridement are clinic. I have arranged for her to see Dr. Marla Roe ham of plastic surgery and this appointment is on Monday. I am hopeful that they will take her to the OR for debridement, possible ACEL ultimately possible skin graft 11/22/15 no major change in the patient's wounds over her bilateral medial calcaneus medial malleolus into the lower legs. Surface on these does not look too bad however on the left there is surrounding erythema and tenderness. This may be cellulitis or could him sleepy tinea. 11/29/15; no major changes in the patient's wounds over her bilateral medial malleolus. There is no infection here and I don't think any additional antibiotics are necessary. There is now plan to move forward. She sees Dr. Marla Roe in a week's time for preparation for operative debridement and ACEL placement I believe on 7/12. She then has a follow-up appointment with Dr. Marla Roe on 7/21 12/28/15; the patient returns today having been taken to the [facility] by Dr. Marla Roe 12/12/15 she underwent  debridement, intraoperative cultures [which were negative]. She had placement of a wound VAC. Parent really ACEL was not available to be placed. The wound VAC foam apparently adhered to the wound since then she's been using silver alginate, Xeroform under Ace wraps. She still says there is a lot of drainage and a lot of pain 01/31/16; this is a patient I see monthly. I had referred her to Dr. Marla Roe him of plastic surgery for large wounds on her bilateral medial ankles. She has been to the OR twice once in early July and once in early August. She tells me over the last 3 weeks she has been using the wound VAC with ACEL underneath it. On the right we've simply been using silver alginate. Under Kerlix Coban wraps. 02/28/16; this is a patient I'm currently seeing monthly. She is gone on to have a skin graft over her large venous insufficiency ulcer on the left medial ankle. This was done by Dr. Marla Roe him. The patient is a bit perturbed about why she didn't have one on her right medial ankle wound. She has been using silver alginate to this. 03/06/16; I received a phone call from her plastic surgery Dr. Marla Roe. She expressed some concern about the viability of the skin graft she did on the left medial ankle wound. Asked me to place Endoform on this. She told me she is not planning to do a subsequent skin graft on the right as the left one did not take very well. I had placed Hydrofera Blue on the right 03/13/16; continue to have a reasonably healthy wound on the right medial ankle. Down to 3 mm in terms of size. There is epithelialization here. The area on the left medial ankle is her skin graft site. I suppose the last week this looks somewhat better. She has an open area inferiorly however in the center there appears  to be some viable tissue. There is a lot of surface callus and eschar that will eventually need to come off however none of this looked to be infected. Patient states that the is  able to keep the dressing on for several days which is an improvement. 03/20/16 no major change in the circumference of either wound however on the left side the patient was at Dr. Eusebio Friendly office and they did a debridement of left wound. 50% of the wound seems to be epithelialized. I been using Endoform on the left Hydrofera Blue in the right 03/27/16; she arrives today with her wound is not looking as healthy as they did last week. The area on the right clearly has an adherent surface to this a very similar surface on the left. Unfortunately for this patient this is all too familiar problem. Clearly the Endoform is not working and will need to change that today that has some potential to help this surface. She does not tolerate debridement in this clinic very well. She is changing the dressing wants 04/03/16; patient arrives with the wounds looking somewhat better especially on the right. Dr. Migdalia Dk change the dressing to silver alginate when she saw her on Monday and also sold her some compression socks. The usefulness of the latter is really not clear and woman with severely draining wounds. 04/10/16; the patient is doing a bit of an experiment wearing the compression stockings that Dr. Migdalia Dk provided her to her left leg and the out of legs based dressings that we provided to the right. 05/01/16; the patient is continuing to wear compression stockings Dr. Migdalia Dk provided her on the left that are apparently silver impregnated. She has been using Iodoflex to the right leg wound. Still a moderate amount of drainage, when she leaves here the wraps only last for 4 days. She has to change the stocking on the left leg every night 05/15/16; she is now using compression stockings bilaterally provided by Dr. Marla Roe. She is wearing a nonadherent layer over the wounds so really I don't think there is anything specific being done to this now. She has some reduction on the left wound. The right is stable.  I think all healing here is being done without a specific dressing 06/09/16; patient arrives here today with not much change in the wound certainly in diameter to large circular wounds over the medial aspect of her ankle bilaterally. Under the light of these services are certainly not viable for healing. There is no evidence of surrounding infection. She is wearing compression stockings with some sort of silver impregnation as prescribed by Dr. Marla Roe. She has a follow-up with her tomorrow. 06/30/16; no major change in the size or condition of her wounds. These are still probably covered with a nonviable surface. She is using only her purchase stockings. She did see Dr. Marla Roe who seemed to want to apply Dakin's solution to this I'm not extreme short what value this would be. I would suggest Iodoflex which she still has at home. 07/28/16; I follow Mrs. Mcclamrock episodically along with Dr. Marla Roe. 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 She has been applying a topical collagen ointment to both wounds with Adaptic. I don't think Dr. Marla Roe is planning to take her back to the OR. 08/19/16; I follow Mrs. Jeneen Rinks on a monthly basis along with Dr. Marla Roe of plastic surgery. She has very refractory venous insufficiency wounds on the bilateral medial lower legs left  greater than right. I been following her for a number of years. At one point I was able to get the right medial malleolus wound to heal and had the left medial malleolus down to about half its current size however and I had to send her to plastic surgery for an operative debridement. Since then things have been stable to slightly improve the area on the right is slightly better one in the left about the same although there is much less adherent surface than I'm used to with this patient. She is using some form of liquid collagen gel that Dr. Marla Roe provided a Kerlix  cover with the patient's own pressure stockings. She tells me that she has extreme pain in both ankles and along the lateral aspect of both feet. She has been unable to work for some period of time. She is telling me she is retiring at the beginning of April. She sees Dr. Doran Durand of orthopedics next week 09/22/16; patient has not seen Dr. Marla Roe since the last time she is here. I'm not really sure what she is using to the wounds other than bits and pieces of think she had left over including most recently Hydrofera Blue. She is using juxtalite stockings. She is having difficulty with her husband's recent illness "stroke". She is having to transport him to various doctors appointments. Dr. Marla Roe left her the option of a repeat debridement with ACEL however she has not been able to get the time to follow-up on this. She continues to have a fair amount of drainage out of these wounds with certainly precludes leaving dressings on all week 10/13/16; patient has not seen Dr. Marla Roe since she was last in our clinic. I'm not really sure what she is doing with the wounds, we did try to get her [employer] [name] and I think she is actually using this most of the time. Because of drainage she states she has to change this every second day although this is an improvement from what she used to do. She went to see Dr. Doran Durand who did not think she had a muscular issue with regards to her feet, he referred her to a neurologist and I think the appointment is sometime in June. I changed her back to Iodoflex which she has used in the past but not recently. 11/03/16; the patient has been using Iodoflex although she ran out of this. Still claims that there is a lot of drainage although the wound does not look like this. No surrounding erythema. She has not been back to see Dr. Marla Roe 11/24/16; the patient has been using Iodoflex again but she ran out of it 2 or 3 days ago. There is no major change in the condition  of either one of these wounds in fact they are larger and covered in a thick adherent surface slough/nonviable tissue especially on the left. She does not tolerate mechanical debridement in our clinic. Going back to see Dr. Marla Roe of plastic surgery for an operative debridement would seem reasonable. 12/15/16; the patient has not been back to see Dr. Marla Roe. She is been dealing with a series of illnesses and her husband which of monopolized her time. She is been using Sorbact which we largely supplied. She states the drainage is bad enough that it maximum she can go 2-3 days without changing the dressing 01/12/2017 -- the patient has not been back for about 4 weeks and has not seen Dr. Marla Roe not does she have any appointment pending. 01/23/17; patient  has not seen Dr. Marla Roe even though I suggested this previously. She is using Santyl that was suggested last week by Dr. Con Memos this Cost her $16 through her insurance which is indeed surprising 02/12/17; continuing Santyl and the patient is changing this daily. A lot of drainage. She has not been back to see plastic surgery she is using an Ace wrap. Our intake nurse suggested wrap around stockings which would make a good reasonable alternative 02/26/17; patient is been using Santyl and changing this daily due to drainage. She has not been to see plastic surgery she uses in April Ace wrap to control the edema. She did obtain extremitease stockings but stated that the edema in her leg was to big for these 03/20/17; patient is using Santyl and Anasept. Surfaces looked better today the area on the right is actually measuring a little smaller. She has states she has a lot of pain in her feet and ankles and is asking for a consult to pain control which I'll try to help her with through our case manager. 04/10/17; the patient arrives with better-looking wound surfaces and is slightly smaller wound on the left she is using a combination of Santyl and  Anasept. She has an appointment or at least as started in the pain control center associated with [facility] 05/14/17; this is a patient who I followed for a prolonged period of time. 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 At one point I had this down to a much smaller wound on the left however these reopened and we've never been able to get these to heal. She has been using Santyl and Anasept gel although 2 weeks ago she ran out of the Anasept gel. She has a stable appearance of the wound. She is going to the wound care clinic at [facility]. They wanted do a nerve block/spinal block although she tells me she is reluctant to go forward with that. 05/21/17; this is a patient I have followed for many years. 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 Chronic pain and deformity in her ankles as well. She is been to see plastic surgery as well as orthopedics. Using PolyMem AG most recently/Kerramax/ABDs and 2 layer compression. She has managed to keep this on and she is coming in for a nurse check to change the dressing on Tuesdays, we see her on Fridays 06/05/17; really quite a good looking surface and the area especially on the right medial has contracted in terms of dimensions. Well granulated healthy-looking tissue on both sides. Even with an open curet there is nothing that even feels abnormal here. This is as good as I've seen this in quite some time. We have been using PolyMem AG and bringing her in for a nurse check 06/12/17; really quite good surface on both of these wounds. The right medial has contracted a bit left is not. We've been using PolyMem and AG and she is coming in for a nurse visit 06/19/17; we have been using PolyMem AG and bringing her in for a nurse check. Dimensions of her wounds are not better but the surfaces looked better bilaterally. She complained of bleeding last night and the left wound and increasing pain  bilaterally. She states her wound pain is more neuropathic than just the wounds. There was some suggestion that this was radicular from her pain management doctor in talking to her it is really difficult to sort this out. 06/26/17; using PolyMem and AG and  bringing her in for a nurse check as All of this and reasonably stable condition. Certainly not improved. The dimensions on the lateral part of the right leg look better but not really measuring better. The medial aspect on the left is about the same. 07/03/16; we have been using PolyMen AG and bringing her in for a nurse check to change the dressings as the wounds have drainage which precludes once weekly changing. We are using all secondary absorptive dressings.our intake nurse is brought up the idea of using a wound VAC/snap VAC on the wound to help with the drainage to see if this would result in some contraction. This is not a bad idea. The area on the right medial is actually looking smaller. Both wounds have a reasonable-looking surface. There is no evidence of cellulitis. The edema is well controlled 07/10/17; the patient was denied for a snap VAC by her insurance. The major issue with these wounds continues to be drainage. We are using wicked PolyMem AG and she is coming in for a nurse visit to change this. The wounds are stable to slightly improved. The surface looks vibrant and the area on the right certainly has shrunk in size but very slowly 07/17/17; the patient still has large wounds on her bilateral medial malleoli. Surface of both of these wounds looks better. The dimensions seem to come and go but no consistent improvement. There is no epithelialization. We do not have options for advanced treatment products due to insurance issues. They did not approve of the wound VAC to help control the drainage. More recently we've been using PolyMem and AG wicked to allow drainage through. We have been bringing her in for a nurse visit to change  this. We do not have a lot of options for wound care products and the home again due to insurance issues 07/24/17; the patient's wound actually looks somewhat better today. No drainage measurements are smaller still healthy-looking surface. We used silver collagen under PolyMen started last week. We have been bringing her in for a dressing change 07/31/17; patient's wound surface continued to look better and I think there is visible change in the dimensions of the wound on the right. Rims of epithelialization. We have been using silver collagen under PolyMen and bringing her in for a dressing change. There appears to be less drainage although she is still in need of the dressing change 08/07/17. Patient's wound surface continues to look better on both sides and the area on the right is definitely smaller. We have been using silver collagen and PolyMen. She feels that the drainage has been it has been better. I asked her about her vascular status. She went to see Dr. Aleda Grana at [facility] and had some form of ablation. I don't have much detail on this. I haven't my notes from 2016 that she was not a candidate for any further ablation but I don't have any more information on this. We had referred her to vein and vascular I don't think she ever went. He does not have a history of PAD although I don't have any information on this either. We don't even have ABIs in our record 08/14/17; we've been using silver collagen and PolyMen cover. And putting the patient and compression. She we are bringing her in as a nurse visit to change this because ofarge amount of drainage. We didn't the ABIs in clinic today since they had been done in many moons 1.2 bilaterally. She has been to see vein  and vascular however this was at [facility] and she had ablation although I really don't have any information on this all seemed biking get a report. She is also been operatively debrided by plastic surgery and had a cell  placed probably 8-12 months ago. This didn't have a major effect. We've been making some gains with current dressings 08/19/17-She is here in follow-up evaluation for bilateral medial malleoli ulcers. She continues to tolerate debridement very poorly. We will continue with recently changed topical treatment; if no significant improvement may consider switching to Iodosorb/Iodoflex. 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 She has been using silver collagen and PolyMem. I believe she has been used and tried on Iodoflex before. 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 These have been very refractory over time. We have been using silver collagen and PolyMen. She is coming in once a week for a doctor's and once a week for nurse visits. We are actually making some progress 09/18/17; the patient's wounds are smaller especially on the right medial. She arrives today to upset to consider even washing these off with Anasept which I think is been part of the reason this is been closing. We've been using collagen covered in PolyMen otherwise. It is noted that she has a small area of folliculitis on the right medial calf that. As we are wrapping her legs I'll give her a short course of doxycycline to make sure this doesn't amount to anything. She is a long list of complaints today including imbalance, shortness of breath on exertion, inversion of her left ankle. With regards to the latter complaints she is been to see orthopedics and they offered her a tendon release surgery I believe but wanted her wounds to be closed first. I have recommended she go see her primary physician with regards to everything else. 09/25/17; patient's wounds are about the same size. We have made  some progress bilaterally although not in recent weeks. She will not allow me T wash these wounds with Anasept even if she is doing her cell. Wheeze we've been using collagen covered in PolyMen. Last week she had a small area of folliculitis this is now opened into a small wound. She completed 5 days of trimethoprim sulfamethoxazole 10/02/17; unfortunately the area on her left medial ankle is worse with a larger wound area towards the Achilles. The patient complains of a lot of pain. She will not allow debridement although visually I don't think there is anything to debridement in any case. We have been using silver collagen and PolyMen for several months now. Initially we are making some progress although I'm not really seeing that today. We will move back to [name]. His admittedly this is a bit of a repeat however I'm hoping that his situation is different now. The patient tells me she had her leg on the left give out on her yesterday this is process some pain. 10/09/17; the patient is seen twice a week largely because of drainage issues coming out of the chronic medial bimalleolar wounds that are chronic. Last week the dimensions of the one on the left looks a little larger I changed her to [facility]. She comes in today with a history of terrible pain in the bilateral wound areas. She will not  allow debridement. She will not even allow a tissue culture. There is no surrounding erythema no no evidence of cellulitis. We have been putting her Kerlix Coban man. She will not allow more aggressive compression as there was a suggestion to put her in 3 layer wraps. 10/16/17; large wounds on her bilateral medial malleoli. These are chronic. Not much change from last week. The surface looks have healthy but absolutely no epithelialization. A lot of pain little less so of drainage. She will not allow debridement or even washing these off in the vigorous fashion with Anasept. 10/23/17; large wounds on  her bilateral malleoli which are chronic. Some improvement in terms of size perhaps on the right since last time I saw these. She states that after we increased the 3 layer compression there was some bleeding, when she came in for a nurse visit she did not want 3 layer compression put back on about our nurse managed to convince her. She has known chronic venous visit issues and I'm hoping to get her to tolerate the 3 layer compression. using Hydrofera Blue 10/30/17; absolutely no change in the condition of either wound although we've had some improvement in dimensions on the right.. Attempted to put her in 3 layer compression she didn't tolerated she is back in 2 layer compression. We've been using Hydrofera Blue We looked over her past records. She had venous reflux studies in November 2016. There was no evidence of deep venous reflux on the right. Superficial vein did not show the greater saphenous vein at think this is been previously ablated the small saphenous vein was within normal limits. The left deep venous system showed no DVT the vessels were positive for deep venous reflux in the posterior tibial veins at the ankle. The greater saphenous vein was surgically absent small saphenous vein was within normal limits. She went to vein and vascular at [facility]. I believe she had an ablation on the left greater saphenous vein. I'll update her reflux studies perhaps ever reviewed by vein and vascular. We've made absolutely no progress in these wounds. Will also try to read and TheraSkins through her insurance 11/06/17; W the patient apparently has a 2 week follow-up with vein and vascular I like him to review the whole issue with regards to her previous vascular workup by Dr. Aleda Grana. We've really made no progress on these wounds in many months. She arrives today with less viable looking surface on the left medial ankle wound. This was apparently looking about the same on Tuesday when she was  here for nurse visit. 11/13/17; deep tissue culture I did last time of the left lower leg showed multiple organisms without any predominating. In particular no Staphylococcus or group A strep were isolated. We sent her for venous reflux studies. She's had a previous left greater saphenous vein stripping and I think sclerotherapy of the right greater saphenous vein. She didn't really look at the lesser saphenous vein this both wounds are on the medial aspect. She has reflux in the common femoral vein and popliteal vein and an accessory vein on the right and the common femoral vein and popliteal vein on the left. I'm going to have her go to see vein and vascular just the look over things and see if anything else beside aggressive compression is indicated here. We have not been able to make any progress on these wounds in spite of the fact that the surface of the wounds is never look too bad. 11/20/17; no major  change in the condition of the wounds. Patient reports a large amount of drainage. She has a lot of complaints of pain although enlisting her today I wonder if some of this at least his neuropathic rather than secondary to her wounds. She has an appointment with vein and vascular on 12/30/17. The refractory nature of these wounds in my mind at least need vein and vascular to look over the wounds the recent reflux studies we did and her history to see if anything further can be done here. I also note her gait is deteriorated quite a bit. 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 She has a bilateral Trendelenburg gait. I wonder if this is neuropathic or perhaps multilevel radicular. 11/27/17; her wounds actually looks slightly better. Healthy-looking granulation tissue a scant amount of epithelialization. [place] healthcare will not pay for [employer]. They will play for tri layer Oasis and Dermagraft. This is not a diabetic ulcer. We'll try for the tri layer Oasis. She still complains of some  drainage. She has a vein and vascular appointment on 12/30/17 12/04/17; the wounds visually look quite good. Healthy-looking granulation with some degree of epithelialization. We are still waiting for response to our request for trial to try layer Oasis. Her appointment with vascular to review venous and arterial issues isn't sold the end of July 7/31. Not allow debridement or even vigorous cleansing of the wound surface. 12/18/17; slightly smaller especially on the right. 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 We've been using Hydrofera Blue. We still are looking into triple layer Oasis through her insurance 01/08/18 on evaluation today patient's wound actually appears to be showing signs of good improvement at this point in time. She has been tolerating the dressing changes without complication. Fortunately there does not appear to be any evidence of infection at this point in time. We have been utilizing silver nitrate which does seem to be of benefit for her which is also good news. Overall I'm very happy with how things seem to be both regards appearance as well as measurement. Patient did see Dr. Bridgett Larsson for evaluation on 12/30/17. In his assessment he felt that stripping would not likely add much more than chronic compression to the patient's healing process. His recommendation was to follow-up in three months with Dr. Doren Custard if she hasn't healed in order to consider referral back to you and see vascular where she previously was in a trial and was able to get her wound to heal. I'll be see what she feels she when you staying compression and he reiterated this as well. 01/13/18 on evaluation today patient appears to actually be doing very well in regard to her bilateral medial malleolus ulcers. She seems to have tolerated the chemical cauterization with silver nitrate last week she did have some pain through that evening but fortunately states that I'll be see since it seems  to be doing better she is overall pleased with the progress. 01/21/18; really quite a remarkable improvement since I've last seen these wounds. We started using silver nitrate specially on the islands of hyper granulation which for some reason her around the wound circumference. This is really done quite nicely. Primary dressing Hydrofera Blue under 4 layer compression. She seems to be able to hold out without a nurse rewrap. Follow-up in 1 week 01/28/18; we've continued the hydrofera blue but continued with chemical cauterization to the wound area that we started about a month ago for irregular hyper granulation. She is  made almost stunning improvement in the overall wound dimensions. I was not really expecting this degree of improvement in these chronic wounds 02/05/18; we continue with Hydrofera Bluebut of also continued the aggressive chemical cauterization with silver nitrate. We made nice progress with the right greater than left wound. 02/12/18. We continued with Hydrofera Blue after aggressive chemical cauterization with silver nitrate. We appear to be making nice progress with both wound areas 02/19/2018; we continue with [facility] after washing the wounds vigorously with Anasept spray and chemical cauterization with silver nitrate. We are making excellent progress. The area on the right's just about closed 02/26/2018. The area on the left medial ankle had too much necrotic debris today. I used a #5 curette we are able to get most of the soft. I continued with the silver nitrate to the much smaller wound on the right medial ankle she had a new area on her right lower pretibial area which she says was due to a role in her compression 03/05/2018; both wound areas look healthy. Not much change in dimensions from last week. I continue to use silver nitrate and Hydrofera Blue. The patient saw Dr. Doren Custard of vein and vascular. He felt she had venous stasis ulcers. He felt based on her previous  arteriogram she should have adequate circulation for healing. Also she has deep venous reflux but really no significant correctable superficial venous reflux at this time. He felt we should continue with conservative management including leg elevation and compression 04/02/2018; since we last saw this woman about a month ago she had a fall apparently suffered a pelvic fracture. I did not look up the x-ray. Nevertheless because of pain she literally was bedbound for 2 weeks and had home health coming out to change the dressing. Somewhat predictably this is resulted in considerable improvement in both wound areas. The right is just about closed on the medial malleolus and the left is about half the size. 04/16/2018; both her wounds continue to go down in size. Using Hydrofera Blue. 05/07/18; both her wounds appeared to be improving especially on the right where it is almost closed. We are using Hydrofera Blue 05/14/2018; slightly worse this week with larger wounds. Surface on the left medial not quite as good. We have been using Hydrofera Blue 05/21/18; again the wounds are slightly larger. Left medial malleolus slightly larger with eschar around the circumference. We have been using Hydrofera Blue undergoing a wraps for a prolonged period of time. This got a lot better when she was more recumbent due to a fall and a back injury. I change the primary dressing the silver alginate today. She did not tolerate a 4 layer compression previously although I may need to bring this up with her next time 05/28/2018; area on the left medial malleolus again is slightly larger with more drainage. Area on the right is roughly unchanged. She has a small area of folliculitis on the right medial just on the lower calf. This does not look ominous. 06/03/2018 left medial malleolus slightly smaller in a better looking surface. We used silver nitrate on this last time with silver alginate. The area on the right appears slightly  smaller 1/10; left medial malleolus slightly smaller. Small open area on the right. We used silver nitrate and silver alginate as of 2 weeks ago. We continue with the wound and compression. These got a lot better when she was off her feet 1/17; right medial malleolus wound is smaller. The left may be slightly smaller.  Both surfaces look somewhat better. 1/24; both wounds are slightly smaller. Using silver alginate under Unna boots 1/31; both wounds appear smaller in fact the area on the right medial is just about closed. Surface eschar. We have been using silver alginate under Unna boots. The patient is less active now spends let much less time on her feet and I think this is contributed to the general improvement in the wound condition 2/7; both wounds appear smaller. I was hopeful the right medial would be closed however there there is still the same small open area. Slight amount of surface eschar on the left the dimensions are smaller there is eschar but the wound edges appear to be free. We have been using silver alginate under Unna boot's 2/14; both wounds once again measure smaller. Circumferential eschar on the left medial. We have been using silver alginate under Unna boots with gradual improvement 2/21; the area on the right medial malleolus has healed. The area on the left is smaller. We have been using silver alginate and Unna boots. We can discharge wrapping the right leg she has 20/30 stockings at home she will need to protect the scar tissue in this area 2/28; the area on the right medial malleolus remains closed the patient has a compression stocking. The area on the left is smaller. We have been using silver alginate and Unna boots. 3/6 the area on the right medial ankle remains closed. Good edema control noted she is using her own compression stocking. The area on the left medial ankle is smaller. We have been managing this with silver alginate and Unna boots which we will continue  today. 3/13; the area on the right medial ankle remains closed and I'm declaring it healed today. When necessary the left is about the same still a healthy-looking surface but no major change and wound area. No evidence of infection and using silver alginate under unna and generally making considerable improvement 3/27 the area on the right medial ankle remains closed the area on the left is about the same as last week. Certainly not any worse we have been using silver alginate under an Unna boot 4/3; the area on the right medial ankle remains closed per the patient. We did not look at this wound. The wound on the left medial ankle is about the same surface looks healthy we have been using silver alginate under an Unna boot 4/10; area on the right medial ankle remains closed per the patient. We did not look at this wound. The wound on the left medial ankle is slightly larger. The patient complains that the [name] caused burning pain all week. She also told us that she was a lot more active this week. Changed her back to silver alginate 4/17; right medial ankle still closed per the patient. Left medial ankle is slightly larger. Using silver alginate. She did not tolerate Hydrofera Blue on this area 4/24; right medial ankle remains closed we have not look at this. The left medial ankle continues to get larger today by about a centimeter. We have been using silver alginate under Unna boots. She complains about 4 layer compression as an alternative. She has been up on her feet working on her garden 5/8; right medial ankle remains closed we did not look at this. The left medial ankle has increased in size about 100%. We have been using silver alginate under Unna boots. She noted increased pain this week and was not surprised that the wound is deteriorated  5/15; no major change in SA however much less erythema ( one week of doxy ocellulitis). 5/22-65 year old female returns at 1 week to the clinic  for left medial ankle wound for which we have been using silver alginate under 3 layer compression She was placed on DOXY at last visit - the wound is wider at this visit. She is in 3 layer compression 5/29; change to [facility] last week. I had given her empiric doxycycline 2 weeks ago for a week. She is in 3 layer compression. She complains of a lot of pain and drainage on presentation today. 6/5; using Hydrofera Blue. I gave her doxycycline recently empirically for erythema and pain around the wound. Believe her cultures showed enterococcus which not would not have been well covered by doxycycline nevertheless the wound looks better and I don't feel specifically that the enterococcus needs to be covered. She has a new what looks like a wrap injury on her lateral left ankle. 6/12; she is using Hydrofera Blue. She has a new area on the left anterior lower tibial area. This was a wrap injury last week. 6/19; the patient is using Hydrofera Blue. She arrived with marked inflammation and erythema around the wound and tenderness. 12/01/18 on evaluation today patient appears to be doing a little bit better based on what I'm hearing from the standpoint of lassos evaluation to this as far as the overall appearance of the wound is concerned. Then sometime substandard she typically sees Dr. Dellia Nims. Nonetheless overall very pleased with the progress that she's made up to this point. No fevers, chills, nausea, or vomiting noted at this time. 7/10; some improvement in the surface area. Aggressively debrided last week apparently. I went ahead with the debridement today although the patient does not tolerate this very well. We have been using Iodoflex. Still a fair amount of drainage 7/17; slightly smaller. Using Iodoflex. 7/24; no change from last week in terms of surface area. We have been using Iodoflex. Surface looks and continues to look somewhat better 7/31; surface area slightly smaller better looking  surface. We have been using Iodoflex. This is under Unna boot compression 8/7-Patient presents at 1 week with Unna boot and Iodoflex, wound appears better 8/14-Patient presents at 1 week with Iodoflex, we use the Unna boot, wound appears to be stable better.Patient is getting Botox treatment for the inversion of the foot for tendon release, Next week 8/21; we are using Iodoflex. Unna boot. The wound is stable in terms of surface area. Under illumination there is some areas of the wound that appear to be either epithelialized or perhaps this is adherent slough at this point I was not really clear. It did not wipe off and I was reluctant to debride this today. 8/28; we are using Iodoflex in an Unna boot. Seems to be making good improvement. 9/4; using Iodoflex and wound is slightly smaller. 9/18; we are using Iodoflex with topical silver nitrate when she is here. The wound continues to be smaller 10/2; patient missed her appointment last week due to GI issues. She left and Iodoflex based dressing on for 2 weeks. Wound is about the same size about the size of a dime on the left medial lower 10/9 we have been using Iodoflex on the medial left ankle wound. She has a new superficial probable wrap injury on the dorsal left ankle 10/16; we have been using Hydrofera Blue since last week. This is on the left medial ankle 10/23; we have been using [facility]  since 2 weeks ago. This is on the left medial ankle. Dimensions are better 11/6; using Hydrofera Blue. I think the wound is smaller but still not closed. Left medial ankle 11/13; we have been using Hydrofera Blue. Wound is certainly no smaller this week. Also the surface not as good. This is the remanent of a very large area on her left medial ankle. 11/20; using Sorbact since last week. Wound was about the same in terms of size although I was disappointed about the surface debris 12/11; 3-week follow-up. Patient was on vacation. Wound is measuring  slightly larger we have been using Sorbact. 12/18; wound is about the same size however surface looks better last week after debridement. We have been using Sorbact under compression 1/15 wound is probably twice the size of last time increased in length nonviable surface. We have been using Sorbact. She was running a mild fever and missed her appointment last week 1/22; the wound is come down in size but under illumination still a very adherent debris we have been Hydrofera Blue that I changed her to last week 1/29; dimensions down slightly. We have been using Hydrofera Blue 2/19 dimensions are the same however there is rims of epithelialization under illumination. Therefore more the surface area may be epithelialized 2/26; the patient's wound actually measures smaller. The wound looks healthy. We have been using Hydrofera Blue. I had some thoughts about running Apligraf then I still may do that however this looks so much better this week we will delay that for now 3/5; the wound is small but about the same as last week. We have been using Hydrofera Blue. No debridement is required today. 3/19; the wound is about the size of a dime. Healthy looking wound even under illumination. We have been using Hydrofera Blue. No mechanical debridement is necessary 3/26; not much change from last week although still looks very healthy. We have been using Hydrofera Blue under Unna boots Patient was offered an ankle fusion by podiatry but not until the wound heals with a proceed with this. 4/9; the patient comes in today with her original wound on the medial ankle looking satisfactory however she has some uncontrolled swelling in the middle part of her leg with 2 new open areas superiorly just lateral to the tibia. I think this was probably a wrap issue. She said she felt uncomfortable during the week but did not call in. We have been using Hydrofera Blue 4/16; the wound on the medial ankle is about the same. She  has innumerable small areas superior to this across her mid tibia. I think this is probably folliculitis. She is also been working in the yard doing a lot of sweating 4/30; the patient issue on the upper areas across her mid tibia of all healed. I think this was excessive yard work if I remember. Her wound on the medial ankle is smaller. Some debris on this we have been using Hydrofera Blue under Unna boots 5/7; mid tibia. She has been using Hydrofera Blue under an Unna wrap. She is apparently going for her ankle surgery on June 3 10/28/19-Patient returns to clinic with the ankle wound, we are using Hydrofera Blue under Unna wrap, surgery is scheduled for her left foot for June 3 so she will be back for nurse visit next week READMISSION 01/17/2020 Mrs. Harker is a 65 year old woman we have had in this clinic for a long period of time with severe venous hypertension and refractory wounds on her medial lower  legs and ankles bilaterally. 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 When she was off her legs for a prolonged period example when she fell and suffered a compression fracture things would heal up quite nicely. She is now retired and we managed to heal up the right medial leg wound. The left one was very tiny last time I saw this although still refractory. 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 It got to the point where this was interfering with her walking and she elected to proceed with a ankle arthrodesis to straighten her her ankle and leave her with a functional outcome for mobilization. The patient was referred to Dr. Doren Custard and really this took some time to arrange. Dr. Doren Custard saw her on 12/07/2019. Once again he verified that she had no arterial issues. She had previously had an  angiogram several years ago. Follow-up ABIs on the left showed an ABI of 1.12 with triphasic waveforms and a TBI of 0.92. She is felt to have chronic deep venous insufficiency but I do not think it was felt that anything could be done from about this from an ablation point of view. At the time Dr. Doren Custard saw this patient the wounds actually look closed via the pictures in his clinic. The patient finally underwent her surgery on 12/15/2019. This went reasonably well and there was a good anatomic outcome. She developed a small distal wound dehiscence on the lateral part of the surgical wound. However more problematically she is developed recurrence of the wound on the medial left ankle. There are actually 2 wounds here one in the distal lower leg and 1 pretty much at the level of the medial malleolus. It is a more distal area that is more problematic. She has been using Hydrofera Blue which started on Friday before this she was simply Ace wrapping. There was a culture done that showed Pseudomonas and she is on ciprofloxacin. A recent CNS on 8/11 was negative. The patient reports some pain but I generally think this is improving. She is using a cam boot completely nonweightbearing using a walker for pivot transfers and a wheelchair 8/24; not much improvement unfortunately she has a surgical wound on the lateral part in the venous insufficiency wound medially. The bottom part of the medial insufficiency wound is still necrotic there is exposed tendon here. We have been using Hydrofera Blue under compression. Her edema control is however better 8/31; patient in for follow-up of his surgical wound on the lateral part of her left leg and chronic venous insufficiency ulcers medially. We put her back in compression last week. She comes in today with a complaint of 3 or 4 days worth of increasing pain. She felt her cam walker was rubbing on the area on the back of her heel. However there is intense erythema seems  more likely she has cellulitis. She had 2 cultures done when she was seeing podiatry in the postop. One of them in late July showed Pseudomonas and she received a course of ciprofloxacin the other was negative on 8/11 she is allergic to penicillin with anaphylactoid complaints of hives oral swelling via information in epic 9/9; when I saw this patient last week she had intense anterior erythema around her wound on the right lateral heel and ankle  and also into the right medial heel. Some of this was no doubt drainage and her walker boot however I was convinced she had cellulitis. I gave her Levaquin and Bactrim she is finishing up on this now. She is following up with Dr. [name] he saw her yesterday. He is taken her out of the walking boot of course she is still nonweightbearing. Her x-ray was negative for any worrisome features such as soft tissue air etc. Things are a lot better this week. She has home health. We have been using Hydrofera Blue under an [employer] which she put back on yesterday. I did not wrap her last week 9/17; her surrounding skin looks a lot better. In fact the area on the left lateral ankle has just a scant amount of eschar. The only remaining wound is the large area on the left medial ankle. Probably about 60% of this is healthy granulation at the surface however she has a significant divot distally. This has adherent debris in it. I been using debridement and silver collagen to try and get this area to fill-in although I do not think we have made much progress this week 9/24; the patient's wound on the left medial ankle looks a lot better. The deeper divot area distally still requires debridement but this is cleaning up quite nicely we have been using silver collagen. The patient is complaining of swelling in her foot and is worried that that is contributing to the nonhealing of the ankle wound. She is also complaining of numbness in her anterior toes 10/4; left medial ankle.  The small area distally still has a divot with necrotic material that I have been debriding away. This has an undermining area. She is approved for Apligraf. She saw Dr. [name] her surgeon on 10/1. I think he declared himself is satisfied with the condition of things. Still nonweightbearing till the end of the month. 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 Her surgical wound is well closed. There is no evidence of infection 10/11; the patient arrived in clinic today with the expectation that we be able to put an Apligraf on this area after debridement however she arrives with a relatively large amount of green drainage on the dressing. The patient states that this started on Friday. She has not been systemically unwell. 10/19; culture I did last week showed both Enterococcus and Pseudomonas. I think this came in separate parts because I stopped her ciprofloxacin I gave her and prescribed her linezolid however now looking at the final culture result this is Pseudomonas which is resistant to quinolones. She has not yet picked up the linezolid apparently phone issues. We are also trying to get a topical antibiotic out of [place] in [place] they can be applied by home health. She is still having green drainage 10/16; the patient has her topical antibiotic from [facility] in [place]. This is a compounded gel with vancomycin and ciprofloxacin and gentamicin. We are applying this on the wound bed with silver alginate over the top with Unna boot wraps. She arrives in clinic today with a lot less ominous looking drainage although she is only use this topical preparation once the second time today. She sees Dr. [name] her surgeon on Friday she has home health changing the dressing 11/2; still using her compounded topical antibiotic under silver alginate. Surface is cleaning up there is less drainage. We had an Apligraf for her today and I elected to apply it. A  light coating of her antibiotic 04/25/2020 upon evaluation today patient appears to be doing well with regard to her ankle ulcer. There is a little bit of slough noted on the surface of the wound I am can have to perform sharp debridement to clear this away today. With that being said other than that fact overall I feel like she is making progress and we do see some new epithelial growth. There is also some improvement in the depth of the wound and that distal portion. There is little bit of slough there as well. 12/7; 2-week follow-up. Apligraf #3. Dimensions are smaller. Closing in especially inferiorly. Still some surface debris. Still using the [facility] topical antibiotic but I told her that I don't think this needs to be renewed 12/21; 2-week follow-up. Apligraf #4 dimensions are smaller. Nice improvement 06/05/2020; 2-week follow-up. The patient's wound on the left medial ankle looks really excellent. Nice granulation. Advancing epithelialization no undermining no evidence of infection. We would have to reapply for another Apligraf but with the condition of this wound I did not feel strongly about it. We used Hydrofera Blue under the same degree of compression. She follows up with Dr. [name] her surgeon a week Friday 06/13/2020 upon evaluation today patient appears to be doing excellent in regard to her wound. She has been tolerating the dressing changes without complication. Fortunately there is no signs of active infection at this time. No fevers, chills, nausea, vomiting, or diarrhea. She was using Hydrofera Blue last week. 06/20/2020 06/20/2020 on evaluation today patient actually appears to be doing excellent in regard to her wound. This is measuring better and looking much better as well. She has been using the collagen that seems to be doing better for her as well even though the [facility] was and is not sticking or feeling as rough on her wound. She did see Dr. [name] on Friday he is very  pleased he also stated none of the hardware has shifted. That is great news 1/27; the patient has a small clean wound all that is remaining. I agree that this is too small to really consider an Apligraf. Under illumination the surface is looking quite good. We have been using collagen although the dimensions are not any better this week 2/2; the patient has a small clean wound on the left medial ankle. Although this left of her substantial original areas. Measurements are smaller. We have been using polymen Ag under an Haematologist. 2/10; small area on the left medial ankle. This looks clean nothing to debride however dimensions are about the same we have been using polymen I think now for 2 weeks 2/17; not much change in surface area. We have been using polymen Ag without any improvement. 3/17; 1 month follow-up. The patient has been using endoform without any improvement in fact I think this looks worse with more depth and more expansion Objective Constitutional Sitting or standing Blood Pressure is within target range for patient.. Pulse regular and within target range for patient.Marland Kitchen Respirations regular, non-labored and within target range.. Temperature is normal and within the target range for the patient.Marland Kitchen Appears in no distress. Vitals Time Taken: 10:12 AM, Height: 68 in, Temperature: 98.0 F, Pulse: 60 bpm, Respiratory Rate: 18 breaths/min, Blood Pressure: 107/68 mmHg. General Notes: Wound exam; I did a surface debridement of adherent fibrinous debris. Also skin overhanging along the wound edge Integumentary (Hair, Skin) Wound #15 status is Open. Original cause of wound was Gradually Appeared. The date acquired was: 12/30/2019.  The wound has been in treatment 30 weeks. The wound is located on the Left,Medial Malleolus. The wound measures 1.7cm length x 1cm width x 0.1cm depth; 1.335cm^2 area and 0.134cm^3 volume. There is Fat Layer (Subcutaneous Tissue) exposed. There is a medium amount of  serosanguineous drainage noted. The wound margin is well defined and not attached to the wound base. There is large (67-100%) red granulation within the wound bed. There is a small (1-33%) amount of necrotic tissue within the wound bed including Adherent Slough. 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 There was a Selective/Open Wound Non-Viable Tissue Debridement with a total area of 1.7 sq cm performed by [name]., MD. With the following instrument(s): Curette to remove Non-Viable tissue/material. Material removed includes [name] after achieving pain control using Lidocaine 4% T opical Solution. A time out was conducted at 10:58, prior to the start of the procedure. A Minimum amount of bleeding was controlled with Pressure. The procedure was tolerated well with a pain level of 0 throughout and a pain level of 0 following the procedure. Post Debridement Measurements: 1.7cm length x 1cm width x 0.2cm depth; 0.267cm^3 volume. Character of Wound/Ulcer Post Debridement is improved. Severity of Tissue Post Debridement is: Fat layer exposed. Post procedure Diagnosis Wound #15: Same as Pre-Procedure Plan Follow-up Appointments: Return Appointment in 1 week. Bathing/ Shower/ Hygiene: May shower with protection but do not get wound dressing(s) wet. Edema Control - Lymphedema / SCD / Other: Elevate legs to the level of the heart or above for 30 minutes daily and/or when sitting, a frequency of: Avoid standing for long periods of time. Exercise regularly Compression stocking  or Garment 20-30 mm/Hg pressure to: - right leg daily Additional Orders / Instructions: Other: - Apply triple antibiotic ointment to redden, scabbed areas to anterior area of left lower leg. WOUND #15: - Malleolus Wound Laterality: Left, Medial Cleanser: Soap and Water 1 x Per Week/15 Days Discharge Instructions: May shower and wash wound with dial antibacterial soap and water prior to dressing change. Peri-Wound Care: Sween Lotion (Moisturizing lotion) 1 x Per Week/15 Days Discharge Instructions: Apply moisturizing lotion as directed Prim Dressing: Cutimed Sorbact Swab 1 x Per Week/15 Days ary Discharge Instructions: Apply to wound bed as instructed Prim Dressing: Endoform 2x2 in 1 x Per Week/15 Days ary Discharge Instructions: Moisten with saline Secondary Dressing: Woven Gauze Sponge, Non-Sterile 4x4 in (Home Health) 1 x Per Week/15 Days Discharge Instructions: Apply over primary dressing as directed. Secondary Dressing: ABD Pad, 5x9 (Home Health) 1 x Per Week/15 Days Discharge Instructions: Apply over primary dressing as directed. Com pression Wrap: Unnaboot w/Calamine, 4x10 (in/yd) (Home Health) 1 x Per Week/15 Days Discharge Instructions: Apply Unnaboot as directed. 1. I use Sorbact on the wound surface to help with the fibrinous debris that required debridement today. 2. The patient was asking about another round of Apligraf which really helped get this area to close down however then we ran out of applications. I am not opposed to this Electronic Signature(s) Signed: 08/16/2020 5:46:09 PM By: Linton Ham MD Entered By: Linton Ham on 08/16/2020 12:39:38 -------------------------------------------------------------------------------- SuperBill Details Patient Name: Date of Service: [name]  [name], [name] G. 08/16/2020 Medical Record Number: [id number] Patient Account Number: [id number] Date of Birth/Sex: Treating RN: 10-25-1955 (65 y.o. [name], Meta.Reding Primary Care Provider:  [name] Other Clinician: Referring Provider: Treating Provider/Extender: [name] in Treatment: 30 Diagnosis Coding ICD-10 Codes Code Description [id number] Chronic venous hypertension (idiopathic) with ulcer and inflammation of left lower extremity L97.828 Non-pressure chronic ulcer of other part of left lower leg with other specified severity L97.328 Non-pressure chronic ulcer of left ankle with other specified severity Facility Procedures CPT4 Code: [id number] Description: 7597 - DEBRIDE WOUND 1ST 20 SQ CM OR < ICD-10 Diagnosis Description L97.828 Non-pressure chronic ulcer of other part of left lower leg with other specified se Modifier: verity Quantity: 1 Physician Procedures : CPT4 Code Description Modifier [id number] - WC PHYS DEBR WO ANESTH 20 SQ CM ICD-10 Diagnosis Description L97.828 Non-pressure chronic ulcer of other part of left lower leg with other specified severity Quantity: 1 Electronic Signature(s) Signed: 08/16/2020 5:46:09 PM By: Linton Ham MD Entered By: Linton Ham on 08/16/2020 12:39:50

## 2020-08-23 ENCOUNTER — Encounter (HOSPITAL_BASED_OUTPATIENT_CLINIC_OR_DEPARTMENT_OTHER): Payer: Medicare Other | Admitting: Internal Medicine

## 2020-08-23 ENCOUNTER — Other Ambulatory Visit: Payer: Self-pay

## 2020-08-23 DIAGNOSIS — I87332 Chronic venous hypertension (idiopathic) with ulcer and inflammation of left lower extremity: Secondary | ICD-10-CM | POA: Diagnosis not present

## 2020-08-23 NOTE — Progress Notes (Signed)
[name], [name] ([id number]) Visit Report for 08/23/2020 Debridement Details Patient Name: Date of Service: [facility], [name] 08/23/2020 9:45 A M Medical Record Number: [id number] Patient Account Number: [id number] Date of Birth/Sex: Treating RN: Jun 26, 1955 (65 y.o. [name], [name] Primary Care Provider: [name] Other Clinician: Referring Provider: Treating Provider/Extender: [name] in Treatment: 31 Debridement Performed for Assessment: Wound #15 Left,Medial Malleolus Performed By: Physician [name]., MD Debridement Type: Debridement Severity of Tissue Pre Debridement: Fat layer exposed Level of Consciousness (Pre-procedure): Awake and Alert Pre-procedure Verification/Time Out Yes - 10:47 Taken: Start Time: 10:48 Pain Control: Lidocaine 4% T opical Solution T Area Debrided (L x W): otal 1.7 (cm) x 1.3 (cm) = 2.21 (cm) Tissue and other material debrided: Viable, Non-Viable, Slough, Slough Level: Non-Viable Tissue Debridement Description: Selective/Open Wound Instrument: Curette Bleeding: Minimum Hemostasis Achieved: Pressure End Time: 10:50 Procedural Pain: 0 Post Procedural Pain: 0 Response to Treatment: Procedure was tolerated well Level of Consciousness (Post- Awake and Alert procedure): Post Debridement Measurements of Total Wound Length: (cm) 1.7 Width: (cm) 1.3 Depth: (cm) 0.2 Volume: (cm) 0.347 Character of Wound/Ulcer Post Debridement: Improved Severity of Tissue Post Debridement: Fat layer exposed Post Procedure Diagnosis Same as Pre-procedure Electronic Signature(s) Signed: 08/23/2020 4:40:49 PM By: Linton Ham MD Signed: 08/23/2020 4:43:39 PM By: Deon Pilling Entered By: Deon Pilling on 08/23/2020 10:50:46 -------------------------------------------------------------------------------- HPI Details Patient Name: Date of Service: [name], [name]. 08/23/2020 9:45 A M Medical Record Number:  [id number] Patient Account Number: [id number] Date of Birth/Sex: Treating RN: 1956-05-29 (65 y.o. [name] Primary Care Provider: [name] Other Clinician: Referring Provider: Treating Provider/Extender: [name] in Treatment: 31 History of Present Illness HPI Description: the remaining wound is over the left medial ankle. Similar wound over the right medial ankle healed largely with use of Apligraf. Most recently we have been using Hydrofera Blue over this wound with considerable improvement. The patient has been extensively worked up in the past for her venous insufficiency and she is not a candidate for antireflux surgery although I have none of the details available currently. 08/24/14; considerable improvement today. About 50% of this wound areas now epithelialized. The base of the wound appears to be healthier granulation.as opposed to last week when she had deteriorated a considerable improvement 08/17/14; unfortunately the wound has regressed somewhat. The areas of epithelialization from the superior aspect are not nearly as healthy as they were last week. The patient thinks her Hydrofera Blue slipped. 09/07/14; unfortunately the area has markedly regressed in the 2 weeks since I've seen this. There is an odor surrounding erythema. The healthy granulation tissue that we had at the base of the wound now is a dusky color. The nurse reports green drainage 09/14/14; the area looks somewhat better than last week. There is less erythema and less drainage. The culture I did did not show any growth. Nevertheless I think it is better to continue the Cipro and doxycycline for a further week. The remaining wound area was debridement. 09/21/14. Wound did not require debridement last week. Still less erythema and less drainage. She can complete her antibiotics. The areas of epithelialization in the superior aspect of the wound do not look as healthy as they did some  weeks ago 10/05/14 continued improvement in the condition of this wound. There is advancing epithelialization. Less aggressive debridement required 10/19/14 continued improvement in the condition and volume of this wound. Less aggressive debridement to the  inferior part of this to remove surface slough and fibrinous eschar 11/02/14 no debridement is required. The surface granulation appears healthy although some of her islands of epithelialization seem to have regressed. No evidence of infection 11/16/14; lites surface debridement done of surface eschar. The wound does not look to be unhealthy. No evidence of infection. Unfortunately the patient has had podiatry issues in the right foot and for some reason has redeveloped small surface ulcerations in the medial right ankle. Her original presentation involved wounds in this area 11/23/14 no debridement. The area on the right ankle has enlarged. The left ankle wound appears stable in terms of the surface although there is periwound inflammation. There has been regression in the amount of new skin 11/30/14 no debridement. Both wound areas appear healthy. There was no evidence of infection. The the new area on the right medial ankle has enlarged although that both the surfaces appear to be stable. 12/07/14; Debridement of the right medial ankle wound. No no debridement was done on the left. 12/14/14 no major change in and now bilateral medial ankle wounds. Both of these are very painful but the no overt evidence of infection. She has had previous venous ablation 12/21/14; patient states that her right medial ankle wound is considerably more painful last week than usual. Her left is also somewhat painful. She could not tolerate debridement. The right medial ankle wound has fibrinous surface eschar 12/28/14 this is a patient with severe bilateral venous insufficiency ulcers. For a considerable period of time we actually had the one on the right medial ankle healed  however this recently opened up again in June. The left medial ankle wound has been a refractory area with some absent flows. We had some success with Hydrofera Blue on this area and it literally closed by 50% however it is recently opened up Foley. Both of these were debridement today of surface eschar. She tolerates this poorly 01/25/15: No change in the status of this. Thick adherent escar. Very poor tolerance of any attempt at debridement. I had healed the right medial malleolus wound for a considerable amount of time and had the left one down to about 50% of the volume although this is totally regressed over the last 48 weeks. Further the right leg has reopened. she is trying to make a appointment with pain and vascular, previous ablations with Dr. Aleda Grana. I do not believe there is an arterial insufficiency issue here 02/01/15 the status of the adherent eschar bilaterally is actually improved. No debridement was done. She did not manage to get vascular studies done 02/08/15 continued debridement of the area was done today. The slough is less adherent and comes off with less pressure. There is no surrounding infection peripheral pulses are intact 02/15/15 selective debridement with a disposable curette. Again the slough is less adherent and comes off with less difficulty. No surrounding infection peripheral pulses are intact. 02/22/15 selective debridement of the right medial ankle wound. Slough comes off with less difficulty. No obvious surrounding infection peripheral pulses are intact I did not debridement the one on the left. Both of these are stable to improved 03/01/15 selective debridement of both wound areas using a curette to. Adherent slough cup soft with less difficulty. No obvious surrounding infection. The patient tells me that 2 days ago she noted a rash above the right leg wrap. She did not have this on her lower legs when she change this over she arrives with widespread left greater  than right almost folliculitis-looking  rash which is extremely pruritic. I don't see anything to culture here. There is no rash on the rest of her body. She feels well systemically. 03/08/15; selective debridement of both wounds using a curette. Base of this does not look unhealthy. She had limegreen drainage coming out of the left leg wound and describes a lot of drainage. The rash on her left leg looks improved to. No cultures were done. 03/22/15; patient was not here last week. Basal wounds does not look healthy and there is no surrounding erythema. No drainage. There is still a rash on the left leg that almost looks vasculitic however it is clearly limited to the top of where the wrap would be. 04/05/15; on the right required a surgical debridement of surface eschar and necrotic subcutaneous tissue. I did not debridement the area on the left. These continue to be large open wounds that are not changing that much. We were successful at one point in healing the area on the right, and at the same time the area on the left was roughly half the size of current measurements. I think a lot of the deterioration has to do with the prolonged time the patient is on her feet at work 04/19/15 I attempted-like surface debridement bilaterally she does not tolerate this. She tells me that she was in allergic care yesterday with extreme pain over her left lateral malleolus/ankle and was told that she has an "sprain" 05/03/15; large bilateral venous insufficiency wounds over the medial malleolus/medial aspect of her ankles. She complains of copious amounts of drainage and his usual large amounts of pain. There is some increasing erythema around the wound on the right extending into the medial aspect of her foot to. historically she came in with these wounds the right one healed and the left one came down to roughly half its current size however the right one is reopened and the left is expanded. 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 05/10/15 large bilateral venous insufficiency wounds. There is less adherence surface left however the surface culture that I did last week grew pseudomonas therefore bilateral selective debridement score necessary. There is surrounding erythema. The patient describes severe bilateral drainage and a lot of pain in the left ankle. Apparently her podiatrist was were ready to do a cortisone shot 05/17/15; the patient complains of pain and again copious amounts of drainage. 05/24/15; we used Iodo flex last week. Patient notes considerable improvement in wound drainage. Only needed to change this once. 05/31/15; we continued Iodoflex; the base of these large wounds bilaterally is not too bad but there is probably likely a significant bioburden here. I would like to debridement just doesn't tolerate it. 06/06/14 I would like to continue the Iodoflex although she still hasn't managed to obtain supplies. She has bilateral medial malleoli or large wounds which are mostly superficial. Both of them are covered circumferentially with some nonviable fibrinous slough although she tolerates debridement very poorly. She apparently has an appointment for an ablation on the right leg by interventional radiology. 06/14/15; the patient arrives with the wounds and static condition. We attempted a debridement although she does not do well with this secondary to pain. I 07/05/15; wounds are not much smaller however there appears to be a cleaner granulating base. The left has tight fibrinous slough greater than the right. Debridement is tolerated poorly due to pain. Iodoflex is done more for these wounds  in any of the multitude of different dressings I have tried on the left 1 and then subsequently the right. 07/12/15; no change in the condition of this wound. I am able to do an aggressive debridement on the right but not the left. She simply cannot tolerate it.  We have been using Iodoflex which helps somewhat. It is worthwhile remembering that at one point we healed the right medial ankle wound and the left was about 25% of the current circumference. We have suggested returning to vascular surgery for review of possible further ablations for one reason or another she has not been able to do this. 07/26/15 no major change in the condition of either wound on her medial ankle. I did not attempt to debridement of these. She has been aggressively scrubbing these while she is in the shower at home. She has her supply of Iodoflex which seems to have done more for these wounds then anything I have put on recently. 08/09/15 wound area appears larger although not verified by measurements. Using Iodoflex 09/05/2015 -- she was here for avisit today but had significant problems with the wound and I was asked to see her for a physician opinion. I have summarize that this lady has had surgery on her left lower extremity about 10 years ago where the possible veins stripping was done. She has had an opinion from interventional radiology around November 2016 where no further sclerotherapy was ordered. The patient works 12 hours a day and stands on a concrete floor with work boots and is unable to get the proper compression she requires and cannot elevate her limbs appropriately at any given time. She has recently grown Pseudomonas from her wound culture but has not started her ciprofloxacin which was called in for her. 09/13/15 this continues to be a difficult situation for this patient. At one point I had this wound down to a 1.5 x 1.5" wound on her left leg. This is deteriorated and the right leg has reopened. She now has substantial wounds on her medial calcaneus, malleoli and into her lower leg. One on the left has surface eschar but these are far too painful for me to debridement here. She has a vascular surgery appointment next week to see if anything can be done to help  here. I think she has had previous ablations several years ago at [facility]. She has no major edema. She tells me that she did not get product last time [facility]] and went for several days without it. She continues to work in work boots 12 hours a day. She cannot get compression/4-layer under her work boots. 09/20/15 no major change. Periwound edema control was not very good. Her point with pain and vascular is next Wednesday the 25th 09/28/15; the patient is seen vascular surgery and is apparently scheduled for repeat duplex ultrasounds of her bilateral lower legs next week. 10/05/15; the patient was seen by Dr. Doren Custard of vascular surgery. He feels that she should have arterial insufficiency excluded as cause/contributed to her nonhealing stage she is therefore booked for an arteriogram. She has apparently monophasic signals in the dorsalis pedis pulses. She also of course has known severe chronic venous insufficiency with previous procedures as noted previously. I had another long discussion with the patient today about her continuing to work 12 hour shifts. I've written her out for 2 months area had concerns about this as her work location is currently undergoing significant turmoil and this may lead to her termination. She  is aware of this however I agree with her that she simply cannot continue to stand for 12 hours multiple days a week with the substantial wound areas she has. 10/19/15; the Dr. Doren Custard appointment was largely for an arteriogram which was normal. She does not have an arterial issue. He didn't make a comment about her chronic venous insufficiency for which she has had previous ablations. Presumably it was not felt that anything additional could be done. The patient is now out of work as I prescribed 2 weeks ago. Her wounds look somewhat less aggravated presumably because of this. I felt I would give debridement another try today 10/25/15; no major change in this patient's wounds. We  are struggling to get her product that she can afford into her own home through her insurance. 11/01/15; no major change in the patient's wounds. I have been using silver alginate as the most affordable product. I spoke to Dr. Marla Roe last week with her requested take her to the OR for surgical debridement and placement of ACEL. Dr. Marla Roe told me that she would be willing to do this however [facility] will not cover this, fortunately the patient has [place] healthcare of some variant 11/08/15; no major change in the patient's wounds. She has been completely nonviable surface that this but is in too much pain with any attempted debridement are clinic. I have arranged for her to see Dr. Marla Roe ham of plastic surgery and this appointment is on Monday. I am hopeful that they will take her to the OR for debridement, possible ACEL ultimately possible skin graft 11/22/15 no major change in the patient's wounds over her bilateral medial calcaneus medial malleolus into the lower legs. Surface on these does not look too bad however on the left there is surrounding erythema and tenderness. This may be cellulitis or could him sleepy tinea. 11/29/15; no major changes in the patient's wounds over her bilateral medial malleolus. There is no infection here and I don't think any additional antibiotics are necessary. There is now plan to move forward. She sees Dr. Marla Roe in a week's time for preparation for operative debridement and ACEL placement I believe on 7/12. She then has a follow-up appointment with Dr. Marla Roe on 7/21 12/28/15; the patient returns today having been taken to the [place] by Dr. Marla Roe 12/12/15 she underwent debridement, intraoperative cultures [which were negative]. She had placement of a wound VAC. Parent really ACEL was not available to be placed. The wound VAC foam apparently adhered to the wound since then she's been using silver alginate, Xeroform under Ace wraps. She  still says there is a lot of drainage and a lot of pain 01/31/16; this is a patient I see monthly. I had referred her to Dr. Marla Roe him of plastic surgery for large wounds on her bilateral medial ankles. She has been to the OR twice once in early July and once in early August. She tells me over the last 3 weeks she has been using the wound VAC with ACEL underneath it. On the right we've simply been using silver alginate. Under Kerlix Coban wraps. 02/28/16; this is a patient I'm currently seeing monthly. She is gone on to have a skin graft over her large venous insufficiency ulcer on the left medial ankle. This was done by Dr. Marla Roe him. The patient is a bit perturbed about why she didn't have one on her right medial ankle wound. She has been using silver alginate to this. 03/06/16; I received a  phone call from her plastic surgery Dr. Marla Roe. She expressed some concern about the viability of the skin graft she did on the left medial ankle wound. Asked me to place Endoform on this. She told me she is not planning to do a subsequent skin graft on the right as the left one did not take very well. I had placed Hydrofera Blue on the right 03/13/16; continue to have a reasonably healthy wound on the right medial ankle. Down to 3 mm in terms of size. There is epithelialization here. The area on the left medial ankle is her skin graft site. I suppose the last week this looks somewhat better. She has an open area inferiorly however in the center there appears to be some viable tissue. There is a lot of surface callus and eschar that will eventually need to come off however none of this looked to be infected. Patient states that the is able to keep the dressing on for several days which is an improvement. 03/20/16 no major change in the circumference of either wound however on the left side the patient was at Dr. Eusebio Friendly office and they did a debridement of left wound. 50% of the wound seems to be  epithelialized. I been using Endoform on the left Hydrofera Blue in the right 03/27/16; she arrives today with her wound is not looking as healthy as they did last week. The area on the right clearly has an adherent surface to this a very similar surface on the left. Unfortunately for this patient this is all too familiar problem. Clearly the Endoform is not working and will need to change that today that has some potential to help this surface. She does not tolerate debridement in this clinic very well. She is changing the dressing wants 04/03/16; patient arrives with the wounds looking somewhat better especially on the right. Dr. Migdalia Dk change the dressing to silver alginate when she saw her on Monday and also sold her some compression socks. The usefulness of the latter is really not clear and woman with severely draining wounds. 04/10/16; the patient is doing a bit of an experiment wearing the compression stockings that Dr. Migdalia Dk provided her to her left leg and the out of legs based dressings that we provided to the right. 05/01/16; the patient is continuing to wear compression stockings Dr. Migdalia Dk provided her on the left that are apparently silver impregnated. She has been using Iodoflex to the right leg wound. Still a moderate amount of drainage, when she leaves here the wraps only last for 4 days. She has to change the stocking on the left leg every night 05/15/16; she is now using compression stockings bilaterally provided by Dr. Marla Roe. She is wearing a nonadherent layer over the wounds so really I don't think there is anything specific being done to this now. She has some reduction on the left wound. The right is stable. I think all healing here is being done without a specific dressing 06/09/16; patient arrives here today with not much change in the wound certainly in diameter to large circular wounds over the medial aspect of her ankle bilaterally. Under the light of these services are  certainly not viable for healing. There is no evidence of surrounding infection. She is wearing compression stockings with some sort of silver impregnation as prescribed by Dr. Marla Roe. She has a follow-up with her tomorrow. 06/30/16; no major change in the size or condition of her wounds. These are still probably covered with a nonviable  surface. She is using only her purchase stockings. She did see Dr. Marla Roe who seemed to want to apply Dakin's solution to this I'm not extreme short what value this would be. I would suggest Iodoflex which she still has at home. 07/28/16; I follow Mrs. Lacayo episodically along with Dr. Marla Roe. 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 She has been applying a topical collagen ointment to both wounds with Adaptic. I don't think Dr. Marla Roe is planning to take her back to the OR. 08/19/16; I follow Mrs. Jeneen Rinks on a monthly basis along with Dr. Marla Roe of plastic surgery. 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 I been following her for a number of years. At one point I was able to get the right medial malleolus wound to heal and had the left medial malleolus down to about half its current size however and I had to send her to plastic surgery for an operative debridement. Since then things have been stable to slightly improve the area on the right is slightly better one in the left about the same although there is much less adherent surface than I'm used to with this patient. She is using some form of liquid collagen gel that Dr. Marla Roe provided a Kerlix cover with the patient's own pressure stockings. She tells me that she has extreme pain in both ankles and along the lateral aspect of both feet. She has been unable to work for some period of time. She is telling me she is retiring at the beginning of April. She sees Dr.  Doran Durand of orthopedics next week 09/22/16; patient has not seen Dr. Marla Roe since the last time she is here. I'm not really sure what she is using to the wounds other than bits and pieces of think she had left over including most recently Hydrofera Blue. She is using juxtalite stockings. She is having difficulty with her husband's recent illness "stroke". She is having to transport him to various doctors appointments. Dr. Marla Roe left her the option of a repeat debridement with ACEL however she has not been able to get the time to follow-up on this. She continues to have a fair amount of drainage out of these wounds with certainly precludes leaving dressings on all week 10/13/16; patient has not seen Dr. Marla Roe since she was last in our clinic. I'm not really sure what she is doing with the wounds, we did try to get her [facility] and I think she is actually using this most of the time. Because of drainage she states she has to change this every second day although this is an improvement from what she used to do. She went to see Dr. Doran Durand who did not think she had a muscular issue with regards to her feet, he referred her to a neurologist and I think the appointment is sometime in June. I changed her back to Iodoflex which she has used in the past but not recently. 11/03/16; the patient has been using Iodoflex although she ran out of this. Still claims that there is a lot of drainage although the wound does not look like this. No surrounding erythema. She has not been back to see Dr. Marla Roe 11/24/16; the patient has been using Iodoflex again but she ran out of it 2 or 3 days ago. There is no major change in the condition of either one of these wounds in fact  they are larger and covered in a thick adherent surface slough/nonviable tissue especially on the left. She does not tolerate mechanical debridement in our clinic. Going back to see Dr. Marla Roe of plastic surgery for an operative  debridement would seem reasonable. 12/15/16; the patient has not been back to see Dr. Marla Roe. She is been dealing with a series of illnesses and her husband which of monopolized her time. She is been using Sorbact which we largely supplied. She states the drainage is bad enough that it maximum she can go 2-3 days without changing the dressing 01/12/2017 -- the patient has not been back for about 4 weeks and has not seen Dr. Marla Roe not does she have any appointment pending. 01/23/17; patient has not seen Dr. Marla Roe even though I suggested this previously. She is using Santyl that was suggested last week by Dr. Con Memos this Cost her $16 through her insurance which is indeed surprising 02/12/17; continuing Santyl and the patient is changing this daily. A lot of drainage. She has not been back to see plastic surgery she is using an Ace wrap. Our intake nurse suggested wrap around stockings which would make a good reasonable alternative 02/26/17; patient is been using Santyl and changing this daily due to drainage. She has not been to see plastic surgery she uses in April Ace wrap to control the edema. She did obtain extremitease stockings but stated that the edema in her leg was to big for these 03/20/17; patient is using Santyl and Anasept. Surfaces looked better today the area on the right is actually measuring a little smaller. She has states she has a lot of pain in her feet and ankles and is asking for a consult to pain control which I'll try to help her with through our case manager. 04/10/17; the patient arrives with better-looking wound surfaces and is slightly smaller wound on the left she is using a combination of Santyl and Anasept. She has an appointment or at least as started in the pain control center associated with [facility] 05/14/17; this is a patient who I followed for a prolonged period of time. 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 At one  point I had this down to a much smaller wound on the left however these reopened and we've never been able to get these to heal. She has been using Santyl and Anasept gel although 2 weeks ago she ran out of the Anasept gel. She has a stable appearance of the wound. She is going to the wound care clinic at [facility] They wanted do a nerve block/spinal block although she tells me she is reluctant to go forward with that. 05/21/17; this is a patient I have followed for many years. 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 Chronic pain and deformity in her ankles as well. She is been to see plastic surgery as well as orthopedics. Using PolyMem AG most recently/Kerramax/ABDs and 2 layer compression. She has managed to keep this on and she is coming in for a nurse check to change the dressing on Tuesdays, we see her on Fridays 06/05/17; really quite a good looking surface and the area especially on the right medial has contracted in terms of dimensions. Well granulated healthy-looking tissue on both sides. Even with an open curet there is nothing that even feels abnormal here. This is as good as I've seen this in quite some time. We have been using PolyMem AG and bringing her in  for a nurse check 06/12/17; really quite good surface on both of these wounds. The right medial has contracted a bit left is not. We've been using PolyMem and AG and she is coming in for a nurse visit 06/19/17; we have been using PolyMem AG and bringing her in for a nurse check. Dimensions of her wounds are not better but the surfaces looked better bilaterally. She complained of bleeding last night and the left wound and increasing pain bilaterally. She states her wound pain is more neuropathic than just the wounds. There was some suggestion that this was radicular from her pain management doctor in talking to her it is really difficult to sort this out. 06/26/17; using PolyMem and AG and bringing her  in for a nurse check as All of this and reasonably stable condition. Certainly not improved. The dimensions on the lateral part of the right leg look better but not really measuring better. The medial aspect on the left is about the same. 07/03/16; we have been using PolyMen AG and bringing her in for a nurse check to change the dressings as the wounds have drainage which precludes once weekly changing. We are using all secondary absorptive dressings.our intake nurse is brought up the idea of using a wound VAC/snap VAC on the wound to help with the drainage to see if this would result in some contraction. This is not a bad idea. The area on the right medial is actually looking smaller. Both wounds have a reasonable-looking surface. There is no evidence of cellulitis. The edema is well controlled 07/10/17; the patient was denied for a snap VAC by her insurance. The major issue with these wounds continues to be drainage. We are using wicked PolyMem AG and she is coming in for a nurse visit to change this. The wounds are stable to slightly improved. The surface looks vibrant and the area on the right certainly has shrunk in size but very slowly 07/17/17; the patient still has large wounds on her bilateral medial malleoli. Surface of both of these wounds looks better. The dimensions seem to come and go but no consistent improvement. There is no epithelialization. We do not have options for advanced treatment products due to insurance issues. They did not approve of the wound VAC to help control the drainage. More recently we've been using PolyMem and AG wicked to allow drainage through. We have been bringing her in for a nurse visit to change this. We do not have a lot of options for wound care products and the home again due to insurance issues 07/24/17; the patient's wound actually looks somewhat better today. No drainage measurements are smaller still healthy-looking surface. We used silver collagen under  PolyMen started last week. We have been bringing her in for a dressing change 07/31/17; patient's wound surface continued to look better and I think there is visible change in the dimensions of the wound on the right. Rims of epithelialization. We have been using silver collagen under PolyMen and bringing her in for a dressing change. There appears to be less drainage although she is still in need of the dressing change 08/07/17. Patient's wound surface continues to look better on both sides and the area on the right is definitely smaller. We have been using silver collagen and PolyMen. She feels that the drainage has been it has been better. I asked her about her vascular status. She went to see Dr. Aleda Grana at [facility] and had some form of ablation. I don't  have much detail on this. I haven't my notes from 2016 that she was not a candidate for any further ablation but I don't have any more information on this. We had referred her to vein and vascular I don't think she ever went. He does not have a history of PAD although I don't have any information on this either. We don't even have ABIs in our record 08/14/17; we've been using silver collagen and PolyMen cover. And putting the patient and compression. She we are bringing her in as a nurse visit to change this because ofarge amount of drainage. We didn't the ABIs in clinic today since they had been done in many moons 1.2 bilaterally. She has been to see vein and vascular however this was at [facility] and she had ablation although I really don't have any information on this all seemed biking get a report. She is also been operatively debrided by plastic surgery and had a cell placed probably 8-12 months ago. This didn't have a major effect. We've been making some gains with current dressings 08/19/17-She is here in follow-up evaluation for bilateral medial malleoli ulcers. She continues to tolerate debridement very poorly. We will continue  with recently changed topical treatment; if no significant improvement may consider switching to Iodosorb/Iodoflex. 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 She has been using silver collagen and PolyMem. I believe she has been used and tried on Iodoflex before. 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 These have been very refractory over time. We have been using silver collagen and PolyMen. She is coming in once a week for a doctor's and once a week for nurse visits. We are actually making some progress 09/18/17; the patient's wounds are smaller especially on the right medial. She arrives today to upset to consider even washing these off with Anasept which I think is been part of the reason this is been closing. We've been using collagen covered in PolyMen otherwise. It is noted that she has a small area of folliculitis on the right medial calf that. As we are wrapping her legs I'll give her a short course of doxycycline to make sure this doesn't amount to anything. She is a long list of complaints today including imbalance, shortness of breath on exertion, inversion of her left ankle. With regards to the latter complaints she is been to see orthopedics and they offered her a tendon release surgery I believe but wanted her wounds to be closed first. I have recommended she go see her primary physician with regards to everything else. 09/25/17; patient's wounds are about the same size. We have made some progress bilaterally although not in recent weeks. She will not allow me T wash these wounds with Anasept even if she is doing her cell. Wheeze we've been using collagen covered in PolyMen. Last week she had a small area of folliculitis this is now opened into a  small wound. She completed 5 days of trimethoprim sulfamethoxazole 10/02/17; unfortunately the area on her left medial ankle is worse with a larger wound area towards the Achilles. The patient complains of a lot of pain. She will not allow debridement although visually I don't think there is anything to debridement in any case. We have been using silver collagen and PolyMen  for several months now. Initially we are making some progress although I'm not really seeing that today. We will move back to [facility]. His admittedly this is a bit of a repeat however I'm hoping that his situation is different now. The patient tells me she had her leg on the left give out on her yesterday this is process some pain. 10/09/17; the patient is seen twice a week largely because of drainage issues coming out of the chronic medial bimalleolar wounds that are chronic. Last week the dimensions of the one on the left looks a little larger I changed her to [facility]. She comes in today with a history of terrible pain in the bilateral wound areas. She will not allow debridement. She will not even allow a tissue culture. There is no surrounding erythema no no evidence of cellulitis. We have been putting her Kerlix Coban man. She will not allow more aggressive compression as there was a suggestion to put her in 3 layer wraps. 10/16/17; large wounds on her bilateral medial malleoli. These are chronic. Not much change from last week. The surface looks have healthy but absolutely no epithelialization. A lot of pain little less so of drainage. She will not allow debridement or even washing these off in the vigorous fashion with Anasept. 10/23/17; large wounds on her bilateral malleoli which are chronic. Some improvement in terms of size perhaps on the right since last time I saw these. She states that after we increased the 3 layer compression there was some bleeding, when she came in for a nurse visit she did not want 3 layer  compression put back on about our nurse managed to convince her. She has known chronic venous visit issues and I'm hoping to get her to tolerate the 3 layer compression. using Hydrofera Blue 10/30/17; absolutely no change in the condition of either wound although we've had some improvement in dimensions on the right.. Attempted to put her in 3 layer compression she didn't tolerated she is back in 2 layer compression. We've been using Hydrofera Blue We looked over her past records. She had venous reflux studies in November 2016. There was no evidence of deep venous reflux on the right. Superficial vein did not show the greater saphenous vein at think this is been previously ablated the small saphenous vein was within normal limits. The left deep venous system showed no DVT the vessels were positive for deep venous reflux in the posterior tibial veins at the ankle. The greater saphenous vein was surgically absent small saphenous vein was within normal limits. She went to vein and vascular at [facility]. I believe she had an ablation on the left greater saphenous vein. I'll update her reflux studies perhaps ever reviewed by vein and vascular. We've made absolutely no progress in these wounds. Will also try to read and TheraSkins through her insurance 11/06/17; W the patient apparently has a 2 week follow-up with vein and vascular I like him to review the whole issue with regards to her previous vascular workup by Dr. Aleda Grana. We've really made no progress on these wounds in many months. She arrives today with less viable looking surface on the left medial ankle wound. This was apparently looking about the same on Tuesday when she was here for nurse visit. 11/13/17; deep tissue culture I did last time of the left lower leg showed multiple organisms without any predominating. In particular no Staphylococcus or group A strep were isolated. We sent her for venous reflux  studies. She's had a previous  left greater saphenous vein stripping and I think sclerotherapy of the right greater saphenous vein. She didn't really look at the lesser saphenous vein this both wounds are on the medial aspect. She has reflux in the common femoral vein and popliteal vein and an accessory vein on the right and the common femoral vein and popliteal vein on the left. I'm going to have her go to see vein and vascular just the look over things and see if anything else beside aggressive compression is indicated here. We have not been able to make any progress on these wounds in spite of the fact that the surface of the wounds is never look too bad. 11/20/17; no major change in the condition of the wounds. Patient reports a large amount of drainage. She has a lot of complaints of pain although enlisting her today I wonder if some of this at least his neuropathic rather than secondary to her wounds. She has an appointment with vein and vascular on 12/30/17. The refractory nature of these wounds in my mind at least need vein and vascular to look over the wounds the recent reflux studies we did and her history to see if anything further can be done here. I also note her gait is deteriorated quite a bit. 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 She has a bilateral Trendelenburg gait. I wonder if this is neuropathic or perhaps multilevel radicular. 11/27/17; her wounds actually looks slightly better. Healthy-looking granulation tissue a scant amount of epithelialization. [place] healthcare will not pay for [employer]. They will play for tri layer Oasis and Dermagraft. This is not a diabetic ulcer. We'll try for the tri layer Oasis. She still complains of some drainage. She has a vein and vascular appointment on 12/30/17 12/04/17; the wounds visually look quite good. Healthy-looking granulation with some degree of epithelialization. We are still waiting for response to our request for trial to try layer Oasis. Her  appointment with vascular to review venous and arterial issues isn't sold the end of July 7/31. Not allow debridement or even vigorous cleansing of the wound surface. 12/18/17; slightly smaller especially on the right. 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 We've been using Hydrofera Blue. We still are looking into triple layer Oasis through her insurance 01/08/18 on evaluation today patient's wound actually appears to be showing signs of good improvement at this point in time. She has been tolerating the dressing changes without complication. Fortunately there does not appear to be any evidence of infection at this point in time. We have been utilizing silver nitrate which does seem to be of benefit for her which is also good news. Overall I'm very happy with how things seem to be both regards appearance as well as measurement. Patient did see Dr. Bridgett Larsson for evaluation on 12/30/17. In his assessment he felt that stripping would not likely add much more than chronic compression to the patient's healing process. His recommendation was to follow-up in three months with Dr. Doren Custard if she hasn't healed in order to consider referral back to you and see vascular where she previously was in a trial and was able to get her wound to heal. I'll be see what she feels she when you staying compression and he reiterated this as well. 01/13/18 on evaluation today patient appears to actually be doing very well in regard to her bilateral medial malleolus ulcers. She seems to have tolerated the chemical cauterization with  silver nitrate last week she did have some pain through that evening but fortunately states that I'll be see since it seems to be doing better she is overall pleased with the progress. 01/21/18; really quite a remarkable improvement since I've last seen these wounds. We started using silver nitrate specially on the islands of hyper granulation which for some reason her around  the wound circumference. This is really done quite nicely. Primary dressing Hydrofera Blue under 4 layer compression. She seems to be able to hold out without a nurse rewrap. Follow-up in 1 week 01/28/18; we've continued the hydrofera blue but continued with chemical cauterization to the wound area that we started about a month ago for irregular hyper granulation. She is made almost stunning improvement in the overall wound dimensions. I was not really expecting this degree of improvement in these chronic wounds 02/05/18; we continue with Hydrofera Bluebut of also continued the aggressive chemical cauterization with silver nitrate. We made nice progress with the right greater than left wound. 02/12/18. We continued with Hydrofera Blue after aggressive chemical cauterization with silver nitrate. We appear to be making nice progress with both wound areas 02/19/2018; we continue with [facility] after washing the wounds vigorously with Anasept spray and chemical cauterization with silver nitrate. We are making excellent progress. The area on the right's just about closed 02/26/2018. The area on the left medial ankle had too much necrotic debris today. I used a #5 curette we are able to get most of the soft. I continued with the silver nitrate to the much smaller wound on the right medial ankle she had a new area on her right lower pretibial area which she says was due to a role in her compression 03/05/2018; both wound areas look healthy. Not much change in dimensions from last week. I continue to use silver nitrate and Hydrofera Blue. The patient saw Dr. Doren Custard of vein and vascular. He felt she had venous stasis ulcers. He felt based on her previous arteriogram she should have adequate circulation for healing. Also she has deep venous reflux but really no significant correctable superficial venous reflux at this time. He felt we should continue with conservative management including leg elevation and  compression 04/02/2018; since we last saw this woman about a month ago she had a fall apparently suffered a pelvic fracture. I did not look up the x-ray. Nevertheless because of pain she literally was bedbound for 2 weeks and had home health coming out to change the dressing. Somewhat predictably this is resulted in considerable improvement in both wound areas. The right is just about closed on the medial malleolus and the left is about half the size. 04/16/2018; both her wounds continue to go down in size. Using Hydrofera Blue. 05/07/18; both her wounds appeared to be improving especially on the right where it is almost closed. We are using Hydrofera Blue 05/14/2018; slightly worse this week with larger wounds. Surface on the left medial not quite as good. We have been using Hydrofera Blue 05/21/18; again the wounds are slightly larger. Left medial malleolus slightly larger with eschar around the circumference. We have been using Hydrofera Blue undergoing a wraps for a prolonged period of time. This got a lot better when she was more recumbent due to a fall and a back injury. I change the primary dressing the silver alginate today. She did not tolerate a 4 layer compression previously although I may need to bring this up with her next time 05/28/2018; area on  the left medial malleolus again is slightly larger with more drainage. Area on the right is roughly unchanged. She has a small area of folliculitis on the right medial just on the lower calf. This does not look ominous. 06/03/2018 left medial malleolus slightly smaller in a better looking surface. We used silver nitrate on this last time with silver alginate. The area on the right appears slightly smaller 1/10; left medial malleolus slightly smaller. Small open area on the right. We used silver nitrate and silver alginate as of 2 weeks ago. We continue with the wound and compression. These got a lot better when she was off her feet 1/17; right  medial malleolus wound is smaller. The left may be slightly smaller. Both surfaces look somewhat better. 1/24; both wounds are slightly smaller. Using silver alginate under Unna boots 1/31; both wounds appear smaller in fact the area on the right medial is just about closed. Surface eschar. We have been using silver alginate under Unna boots. The patient is less active now spends let much less time on her feet and I think this is contributed to the general improvement in the wound condition 2/7; both wounds appear smaller. I was hopeful the right medial would be closed however there there is still the same small open area. Slight amount of surface eschar on the left the dimensions are smaller there is eschar but the wound edges appear to be free. We have been using silver alginate under Unna boot's 2/14; both wounds once again measure smaller. Circumferential eschar on the left medial. We have been using silver alginate under Unna boots with gradual improvement 2/21; the area on the right medial malleolus has healed. The area on the left is smaller. We have been using silver alginate and Unna boots. We can discharge wrapping the right leg she has 20/30 stockings at home she will need to protect the scar tissue in this area 2/28; the area on the right medial malleolus remains closed the patient has a compression stocking. The area on the left is smaller. We have been using silver alginate and Unna boots. 3/6 the area on the right medial ankle remains closed. Good edema control noted she is using her own compression stocking. The area on the left medial ankle is smaller. We have been managing this with silver alginate and Unna boots which we will continue today. 3/13; the area on the right medial ankle remains closed and I'm declaring it healed today. When necessary the left is about the same still a healthy-looking surface but no major change and wound area. No evidence of infection and using silver  alginate under unna and generally making considerable improvement 3/27 the area on the right medial ankle remains closed the area on the left is about the same as last week. Certainly not any worse we have been using silver alginate under an Unna boot 4/3; the area on the right medial ankle remains closed per the patient. We did not look at this wound. The wound on the left medial ankle is about the same surface looks healthy we have been using silver alginate under an Unna boot 4/10; area on the right medial ankle remains closed per the patient. We did not look at this wound. The wound on the left medial ankle is slightly larger. The patient complains that the [facility] caused burning pain all week. She also told us that she was a lot more active this week. Changed her back to silver alginate 4/17; right  medial ankle still closed per the patient. Left medial ankle is slightly larger. Using silver alginate. She did not tolerate Hydrofera Blue on this area 4/24; right medial ankle remains closed we have not look at this. The left medial ankle continues to get larger today by about a centimeter. We have been using silver alginate under Unna boots. She complains about 4 layer compression as an alternative. She has been up on her feet working on her garden 5/8; right medial ankle remains closed we did not look at this. The left medial ankle has increased in size about 100%. We have been using silver alginate under Unna boots. She noted increased pain this week and was not surprised that the wound is deteriorated 5/15; no major change in SA however much less erythema ( one week of doxy ocellulitis). 5/22-65 year old female returns at 1 week to the clinic for left medial ankle wound for which we have been using silver alginate under 3 layer compression She was placed on DOXY at last visit - the wound is wider at this visit. She is in 3 layer compression 5/29; change to [facility] last week. I had  given her empiric doxycycline 2 weeks ago for a week. She is in 3 layer compression. She complains of a lot of pain and drainage on presentation today. 6/5; using Hydrofera Blue. I gave her doxycycline recently empirically for erythema and pain around the wound. Believe her cultures showed enterococcus which not would not have been well covered by doxycycline nevertheless the wound looks better and I don't feel specifically that the enterococcus needs to be covered. She has a new what looks like a wrap injury on her lateral left ankle. 6/12; she is using Hydrofera Blue. She has a new area on the left anterior lower tibial area. This was a wrap injury last week. 6/19; the patient is using Hydrofera Blue. She arrived with marked inflammation and erythema around the wound and tenderness. 12/01/18 on evaluation today patient appears to be doing a little bit better based on what I'm hearing from the standpoint of lassos evaluation to this as far as the overall appearance of the wound is concerned. Then sometime substandard she typically sees Dr. Dellia Nims. Nonetheless overall very pleased with the progress that she's made up to this point. No fevers, chills, nausea, or vomiting noted at this time. 7/10; some improvement in the surface area. Aggressively debrided last week apparently. I went ahead with the debridement today although the patient does not tolerate this very well. We have been using Iodoflex. Still a fair amount of drainage 7/17; slightly smaller. Using Iodoflex. 7/24; no change from last week in terms of surface area. We have been using Iodoflex. Surface looks and continues to look somewhat better 7/31; surface area slightly smaller better looking surface. We have been using Iodoflex. This is under Unna boot compression 8/7-Patient presents at 1 week with Unna boot and Iodoflex, wound appears better 8/14-Patient presents at 1 week with Iodoflex, we use the Unna boot, wound appears to be stable  better.Patient is getting Botox treatment for the inversion of the foot for tendon release, Next week 8/21; we are using Iodoflex. Unna boot. The wound is stable in terms of surface area. Under illumination there is some areas of the wound that appear to be either epithelialized or perhaps this is adherent slough at this point I was not really clear. It did not wipe off and I was reluctant to debride this today. 8/28; we are using  Iodoflex in an Haematologist. Seems to be making good improvement. 9/4; using Iodoflex and wound is slightly smaller. 9/18; we are using Iodoflex with topical silver nitrate when she is here. The wound continues to be smaller 10/2; patient missed her appointment last week due to GI issues. She left and Iodoflex based dressing on for 2 weeks. Wound is about the same size about the size of a dime on the left medial lower 10/9 we have been using Iodoflex on the medial left ankle wound. She has a new superficial probable wrap injury on the dorsal left ankle 10/16; we have been using Hydrofera Blue since last week. This is on the left medial ankle 10/23; we have been using Hydrofera Blue since 2 weeks ago. This is on the left medial ankle. Dimensions are better 11/6; using Hydrofera Blue. I think the wound is smaller but still not closed. Left medial ankle 11/13; we have been using Hydrofera Blue. Wound is certainly no smaller this week. Also the surface not as good. This is the remanent of a very large area on her left medial ankle. 11/20; using Sorbact since last week. Wound was about the same in terms of size although I was disappointed about the surface debris 12/11; 3-week follow-up. Patient was on vacation. Wound is measuring slightly larger we have been using Sorbact. 12/18; wound is about the same size however surface looks better last week after debridement. We have been using Sorbact under compression 1/15 wound is probably twice the size of last time increased in length  nonviable surface. We have been using Sorbact. She was running a mild fever and missed her appointment last week 1/22; the wound is come down in size but under illumination still a very adherent debris we have been Hydrofera Blue that I changed her to last week 1/29; dimensions down slightly. We have been using Hydrofera Blue 2/19 dimensions are the same however there is rims of epithelialization under illumination. Therefore more the surface area may be epithelialized 2/26; the patient's wound actually measures smaller. The wound looks healthy. We have been using Hydrofera Blue. I had some thoughts about running Apligraf then I still may do that however this looks so much better this week we will delay that for now 3/5; the wound is small but about the same as last week. We have been using Hydrofera Blue. No debridement is required today. 3/19; the wound is about the size of a dime. Healthy looking wound even under illumination. We have been using Hydrofera Blue. No mechanical debridement is necessary 3/26; not much change from last week although still looks very healthy. We have been using Hydrofera Blue under Unna boots Patient was offered an ankle fusion by podiatry but not until the wound heals with a proceed with this. 4/9; the patient comes in today with her original wound on the medial ankle looking satisfactory however she has some uncontrolled swelling in the middle part of her leg with 2 new open areas superiorly just lateral to the tibia. I think this was probably a wrap issue. She said she felt uncomfortable during the week but did not call in. We have been using Hydrofera Blue 4/16; the wound on the medial ankle is about the same. She has innumerable small areas superior to this across her mid tibia. I think this is probably folliculitis. She is also been working in the yard doing a lot of sweating 4/30; the patient issue on the upper areas across her mid tibia of  all healed. I think  this was excessive yard work if I remember. Her wound on the medial ankle is smaller. Some debris on this we have been using Hydrofera Blue under Unna boots 5/7; mid tibia. She has been using Hydrofera Blue under an Unna wrap. She is apparently going for her ankle surgery on June 3 10/28/19-Patient returns to clinic with the ankle wound, we are using Hydrofera Blue under Unna wrap, surgery is scheduled for her left foot for June 3 so she will be back for nurse visit next week READMISSION 01/17/2020 Mrs. Isbill is a 65 year old woman we have had in this clinic for a long period of time with severe venous hypertension and refractory wounds on her medial lower legs and ankles bilaterally. 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 When she was off her legs for a prolonged period example when she fell and suffered a compression fracture things would heal up quite nicely. She is now retired and we managed to heal up the right medial leg wound. The left one was very tiny last time I saw this although still refractory. 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 It got to the point where this was interfering with her walking and she elected to proceed with a ankle arthrodesis to straighten her her ankle and leave her with a functional outcome for mobilization. The patient was referred to Dr. Doren Custard and really this took some time to arrange. Dr. Doren Custard saw her on 12/07/2019. Once again he verified that she had no arterial issues. She had previously had an angiogram several years ago. Follow-up ABIs on the left showed an ABI of 1.12 with triphasic waveforms and a TBI of 0.92. She is felt to have chronic deep venous insufficiency but I do not think it was felt that anything could be done from about this from  an ablation point of view. At the time Dr. Doren Custard saw this patient the wounds actually look closed via the pictures in his clinic. The patient finally underwent her surgery on 12/15/2019. This went reasonably well and there was a good anatomic outcome. She developed a small distal wound dehiscence on the lateral part of the surgical wound. However more problematically she is developed recurrence of the wound on the medial left ankle. There are actually 2 wounds here one in the distal lower leg and 1 pretty much at the level of the medial malleolus. It is a more distal area that is more problematic. She has been using Hydrofera Blue which started on Friday before this she was simply Ace wrapping. There was a culture done that showed Pseudomonas and she is on ciprofloxacin. A recent CNS on 8/11 was negative. The patient reports some pain but I generally think this is improving. She is using a cam boot completely nonweightbearing using a walker for pivot transfers and a wheelchair 8/24; not much improvement unfortunately she has a surgical wound on the lateral part in the venous insufficiency wound medially. The bottom part of the medial insufficiency wound is still necrotic there is exposed tendon here. We have been using Hydrofera Blue under compression. Her edema control is however better 8/31; patient in for follow-up of his surgical wound on the lateral part of her left leg and chronic venous insufficiency ulcers medially.  We put her back in compression last week. She comes in today with a complaint of 3 or 4 days worth of increasing pain. She felt her cam walker was rubbing on the area on the back of her heel. However there is intense erythema seems more likely she has cellulitis. She had 2 cultures done when she was seeing podiatry in the postop. One of them in late July showed Pseudomonas and she received a course of ciprofloxacin the other was negative on 8/11 she is allergic to penicillin  with anaphylactoid complaints of hives oral swelling via information in epic 9/9; when I saw this patient last week she had intense anterior erythema around her wound on the right lateral heel and ankle and also into the right medial heel. Some of this was no doubt drainage and her walker boot however I was convinced she had cellulitis. I gave her Levaquin and Bactrim she is finishing up on this now. She is following up with Dr. [name] he saw her yesterday. He is taken her out of the walking boot of course she is still nonweightbearing. Her x-ray was negative for any worrisome features such as soft tissue air etc. Things are a lot better this week. She has home health. We have been using Hydrofera Blue under an [employer] which she put back on yesterday. I did not wrap her last week 9/17; her surrounding skin looks a lot better. In fact the area on the left lateral ankle has just a scant amount of eschar. The only remaining wound is the large area on the left medial ankle. Probably about 60% of this is healthy granulation at the surface however she has a significant divot distally. This has adherent debris in it. I been using debridement and silver collagen to try and get this area to fill-in although I do not think we have made much progress this week 9/24; the patient's wound on the left medial ankle looks a lot better. The deeper divot area distally still requires debridement but this is cleaning up quite nicely we have been using silver collagen. The patient is complaining of swelling in her foot and is worried that that is contributing to the nonhealing of the ankle wound. She is also complaining of numbness in her anterior toes 10/4; left medial ankle. The small area distally still has a divot with necrotic material that I have been debriding away. This has an undermining area. She is approved for Apligraf. She saw Dr. [name] her surgeon on 10/1. I think he declared himself is satisfied with the  condition of things. Still nonweightbearing till the end of the month. 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 Her surgical wound is well closed. There is no evidence of infection 10/11; the patient arrived in clinic today with the expectation that we be able to put an Apligraf on this area after debridement however she arrives with a relatively large amount of green drainage on the dressing. The patient states that this started on Friday. She has not been systemically unwell. 10/19; culture I did last week showed both Enterococcus and Pseudomonas. I think this came in separate parts because I stopped her ciprofloxacin I gave her and prescribed her linezolid however now looking at the final culture result this is Pseudomonas which is resistant to quinolones. She has not yet picked up the linezolid apparently phone issues. We are also trying to get a topical antibiotic out of [place] in [place] they  can be applied by home health. She is still having green drainage 10/16; the patient has her topical antibiotic from [facility] in [place]. This is a compounded gel with vancomycin and ciprofloxacin and gentamicin. We are applying this on the wound bed with silver alginate over the top with Unna boot wraps. She arrives in clinic today with a lot less ominous looking drainage although she is only use this topical preparation once the second time today. She sees Dr. [name] her surgeon on Friday she has home health changing the dressing 11/2; still using her compounded topical antibiotic under silver alginate. Surface is cleaning up there is less drainage. We had an Apligraf for her today and I elected to apply it. A light coating of her antibiotic 04/25/2020 upon evaluation today patient appears to be doing well with regard to her ankle ulcer. There is a little bit of slough noted on the surface of the wound I am can have to perform sharp debridement to clear  this away today. With that being said other than that fact overall I feel like she is making progress and we do see some new epithelial growth. There is also some improvement in the depth of the wound and that distal portion. There is little bit of slough there as well. 12/7; 2-week follow-up. Apligraf #3. Dimensions are smaller. Closing in especially inferiorly. Still some surface debris. Still using the [facility] topical antibiotic but I told her that I don't think this needs to be renewed 12/21; 2-week follow-up. Apligraf #4 dimensions are smaller. Nice improvement 06/05/2020; 2-week follow-up. The patient's wound on the left medial ankle looks really excellent. Nice granulation. Advancing epithelialization no undermining no evidence of infection. We would have to reapply for another Apligraf but with the condition of this wound I did not feel strongly about it. We used Hydrofera Blue under the same degree of compression. She follows up with Dr. [name] her surgeon a week Friday 06/13/2020 upon evaluation today patient appears to be doing excellent in regard to her wound. She has been tolerating the dressing changes without complication. Fortunately there is no signs of active infection at this time. No fevers, chills, nausea, vomiting, or diarrhea. She was using Hydrofera Blue last week. 06/20/2020 06/20/2020 on evaluation today patient actually appears to be doing excellent in regard to her wound. This is measuring better and looking much better as well. She has been using the collagen that seems to be doing better for her as well even though the [facility] was and is not sticking or feeling as rough on her wound. She did see Dr. [name] on Friday he is very pleased he also stated none of the hardware has shifted. That is great news 1/27; the patient has a small clean wound all that is remaining. I agree that this is too small to really consider an Apligraf. Under illumination the surface is looking  quite good. We have been using collagen although the dimensions are not any better this week 2/2; the patient has a small clean wound on the left medial ankle. Although this left of her substantial original areas. Measurements are smaller. We have been using polymen Ag under an Haematologist. 2/10; small area on the left medial ankle. This looks clean nothing to debride however dimensions are about the same we have been using polymen I think now for 2 weeks 2/17; not much change in surface area. We have been using polymen Ag without any improvement. 3/17; 1 month follow-up. The  patient has been using endoform without any improvement in fact I think this looks worse with more depth and more expansion 3/24; no improvement. Perhaps less debris on the surface. We have been using Sorbact for 1 week Electronic Signature(s) Signed: 08/23/2020 4:40:49 PM By: Linton Ham MD Entered By: Linton Ham on 08/23/2020 11:08:33 -------------------------------------------------------------------------------- Physical Exam Details Patient Name: Date of Service: [name], [name]. 08/23/2020 9:45 A M Medical Record Number: [id number] Patient Account Number: [id number] Date of Birth/Sex: Treating RN: 22-Apr-1956 (65 y.o. [name] Primary Care Provider: [name] Other Clinician: Referring Provider: Treating Provider/Extender: [name] in Treatment: 31 Constitutional Sitting or standing Blood Pressure is within target range for patient.. Pulse regular and within target range for patient.Marland Kitchen Respirations regular, non-labored and within target range.. Temperature is normal and within the target range for the patient.Marland Kitchen Appears in no distress. Cardiovascular Pedal pulses palpable.. Notes Wound exam; very fibrinous adherent slough removed with a #5 curette. I think there is less of this today. The surface generally looks better [employer]) Signed:  08/23/2020 4:40:49 PM By: Linton Ham MD Entered By: Linton Ham on 08/23/2020 11:09:38 -------------------------------------------------------------------------------- Physician Orders Details Patient Name: Date of Service: [name], [name]. 08/23/2020 9:45 A M Medical Record Number: [id number] Patient Account Number: [id number] Date of Birth/Sex: Treating RN: 04-Oct-1955 (65 y.o. [name] Primary Care Provider: [name] Other Clinician: Referring Provider: Treating Provider/Extender: [name] in Treatment: 48 Verbal / Phone Orders: No Diagnosis Coding Follow-up Appointments Return Appointment in 1 week. Cellular or Tissue Based Products Cellular or Tissue Based Product Type: - run insurance authorization for apligraft. Bathing/ Shower/ Hygiene May shower with protection but do not get wound dressing(s) wet. Edema Control - Lymphedema / SCD / Other Elevate legs to the level of the heart or above for 30 minutes daily and/or when sitting, a frequency of: Avoid standing for long periods of time. Exercise regularly Compression stocking or Garment 20-30 mm/Hg pressure to: - right leg daily Additional Orders / Instructions Other: - Apply triple antibiotic ointment to redden, scabbed areas to anterior area of left lower leg. Wound Treatment Wound #15 - Malleolus Wound Laterality: Left, Medial Cleanser: Soap and Water 1 x Per Week/15 Days Discharge Instructions: May shower and wash wound with dial antibacterial soap and water prior to dressing change. Peri-Wound Care: Sween Lotion (Moisturizing lotion) 1 x Per Week/15 Days Discharge Instructions: Apply moisturizing lotion as directed Prim Dressing: Cutimed Sorbact Swab 1 x Per Week/15 Days ary Discharge Instructions: Apply to wound bed as instructed Secondary Dressing: Woven Gauze Sponge, Non-Sterile 4x4 in (Home Health) 1 x Per Week/15 Days Discharge Instructions: Apply over primary  dressing as directed. Secondary Dressing: ABD Pad, 5x9 (Home Health) 1 x Per Week/15 Days Discharge Instructions: Apply over primary dressing as directed. Compression Wrap: Unnaboot w/Calamine, 4x10 (in/yd) (Home Health) 1 x Per Week/15 Days Discharge Instructions: Apply Unnaboot as directed. Electronic Signature(s) Signed: 08/23/2020 4:40:49 PM By: Linton Ham MD Signed: 08/23/2020 4:43:39 PM By: Deon Pilling Entered By: Deon Pilling on 08/23/2020 10:59:51 -------------------------------------------------------------------------------- Problem List Details Patient Name: Date of Service: [name], [name]. 08/23/2020 9:45 A M Medical Record Number: [id number] Patient Account Number: [id number] Date of Birth/Sex: Treating RN: 07-21-1955 (65 y.o. [name] Primary Care Provider: [name] Other Clinician: Referring Provider: Treating Provider/Extender: [name] in Treatment: 31 Active Problems ICD-10 Encounter Code Description Active Date MDM  Diagnosis I87.332 Chronic venous hypertension (idiopathic) with ulcer and inflammation of left 01/17/2020 No Yes lower extremity L97.828 Non-pressure chronic ulcer of other part of left lower leg with other specified 01/17/2020 No Yes severity L97.328 Non-pressure chronic ulcer of left ankle with other specified severity 01/17/2020 No Yes Inactive Problems ICD-10 Code Description Active Date Inactive Date L03.116 Cellulitis of left lower limb 01/31/2020 01/31/2020 T81.31XD Disruption of external operation (surgical) wound, not elsewhere classified, subsequent 01/17/2020 01/17/2020 encounter Resolved Problems Electronic Signature(s) Signed: 08/23/2020 4:40:49 PM By: Linton Ham MD Entered By: Linton Ham on 08/23/2020 11:06:43 -------------------------------------------------------------------------------- Progress Note Details Patient Name: Date of Service: [name], [name]. 08/23/2020 9:45  A M Medical Record Number: [id number] Patient Account Number: [id number] Date of Birth/Sex: Treating RN: [birth year] (65 y.o. [name] Primary Care Provider: [name] Other Clinician: Referring Provider: Treating Provider/Extender: [name] in Treatment: 31 Subjective History of Present Illness (HPI) the remaining wound is over the left medial ankle. Similar wound over the right medial ankle healed largely with use of Apligraf. Most recently we have been using Hydrofera Blue over this wound with considerable improvement. The patient has been extensively worked up in the past for her venous insufficiency and she is not a candidate for antireflux surgery although I have none of the details available currently. 08/24/14; considerable improvement today. About 50% of this wound areas now epithelialized. The base of the wound appears to be healthier granulation.as opposed to last week when she had deteriorated a considerable improvement 08/17/14; unfortunately the wound has regressed somewhat. The areas of epithelialization from the superior aspect are not nearly as healthy as they were last week. The patient thinks her Hydrofera Blue slipped. 09/07/14; unfortunately the area has markedly regressed in the 2 weeks since I've seen this. There is an odor surrounding erythema. The healthy granulation tissue that we had at the base of the wound now is a dusky color. The nurse reports green drainage 09/14/14; the area looks somewhat better than last week. There is less erythema and less drainage. The culture I did did not show any growth. Nevertheless I think it is better to continue the Cipro and doxycycline for a further week. The remaining wound area was debridement. 09/21/14. Wound did not require debridement last week. Still less erythema and less drainage. She can complete her antibiotics. The areas of epithelialization in the superior aspect of the wound do not look  as healthy as they did some weeks ago 10/05/14 continued improvement in the condition of this wound. There is advancing epithelialization. Less aggressive debridement required 10/19/14 continued improvement in the condition and volume of this wound. Less aggressive debridement to the inferior part of this to remove surface slough and fibrinous eschar 11/02/14 no debridement is required. The surface granulation appears healthy although some of her islands of epithelialization seem to have regressed. No evidence of infection 11/16/14; lites surface debridement done of surface eschar. The wound does not look to be unhealthy. No evidence of infection. Unfortunately the patient has had podiatry issues in the right foot and for some reason has redeveloped small surface ulcerations in the medial right ankle. Her original presentation involved wounds in this area 11/23/14 no debridement. The area on the right ankle has enlarged. The left ankle wound appears stable in terms of the surface although there is periwound inflammation. There has been regression in the amount of new skin 11/30/14 no debridement. Both wound areas appear healthy. There was no evidence  of infection. The the new area on the right medial ankle has enlarged although that both the surfaces appear to be stable. 12/07/14; Debridement of the right medial ankle wound. No no debridement was done on the left. 12/14/14 no major change in and now bilateral medial ankle wounds. Both of these are very painful but the no overt evidence of infection. She has had previous venous ablation 12/21/14; patient states that her right medial ankle wound is considerably more painful last week than usual. Her left is also somewhat painful. She could not tolerate debridement. The right medial ankle wound has fibrinous surface eschar 12/28/14 this is a patient with severe bilateral venous insufficiency ulcers. For a considerable period of time we actually had the one on the  right medial ankle healed however this recently opened up again in June. The left medial ankle wound has been a refractory area with some absent flows. We had some success with Hydrofera Blue on this area and it literally closed by 50% however it is recently opened up Foley. Both of these were debridement today of surface eschar. She tolerates this poorly 01/25/15: No change in the status of this. Thick adherent escar. Very poor tolerance of any attempt at debridement. I had healed the right medial malleolus wound for a considerable amount of time and had the left one down to about 50% of the volume although this is totally regressed over the last 48 weeks. Further the right leg has reopened. she is trying to make a appointment with pain and vascular, previous ablations with Dr. Aleda Grana. I do not believe there is an arterial insufficiency issue here 02/01/15 the status of the adherent eschar bilaterally is actually improved. No debridement was done. She did not manage to get vascular studies done 02/08/15 continued debridement of the area was done today. The slough is less adherent and comes off with less pressure. There is no surrounding infection peripheral pulses are intact 02/15/15 selective debridement with a disposable curette. Again the slough is less adherent and comes off with less difficulty. No surrounding infection peripheral pulses are intact. 02/22/15 selective debridement of the right medial ankle wound. Slough comes off with less difficulty. No obvious surrounding infection peripheral pulses are intact I did not debridement the one on the left. Both of these are stable to improved 03/01/15 selective debridement of both wound areas using a curette to. Adherent slough cup soft with less difficulty. No obvious surrounding infection. The patient tells me that 2 days ago she noted a rash above the right leg wrap. She did not have this on her lower legs when she change this over she arrives  with widespread left greater than right almost folliculitis-looking rash which is extremely pruritic. I don't see anything to culture here. There is no rash on the rest of her body. She feels well systemically. 03/08/15; selective debridement of both wounds using a curette. Base of this does not look unhealthy. She had limegreen drainage coming out of the left leg wound and describes a lot of drainage. The rash on her left leg looks improved to. No cultures were done. 03/22/15; patient was not here last week. Basal wounds does not look healthy and there is no surrounding erythema. No drainage. There is still a rash on the left leg that almost looks vasculitic however it is clearly limited to the top of where the wrap would be. 04/05/15; on the right required a surgical debridement of surface eschar and necrotic subcutaneous tissue. I did not  debridement the area on the left. These continue to be large open wounds that are not changing that much. We were successful at one point in healing the area on the right, and at the same time the area on the left was roughly half the size of current measurements. I think a lot of the deterioration has to do with the prolonged time the patient is on her feet at work 04/19/15 I attempted-like surface debridement bilaterally she does not tolerate this. She tells me that she was in allergic care yesterday with extreme pain over her left lateral malleolus/ankle and was told that she has an "sprain" 05/03/15; large bilateral venous insufficiency wounds over the medial malleolus/medial aspect of her ankles. She complains of copious amounts of drainage and his usual large amounts of pain. There is some increasing erythema around the wound on the right extending into the medial aspect of her foot to. historically she came in with these wounds the right one healed and the left one came down to roughly half its current size however the right one is reopened and the left  is expanded. 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 05/10/15 large bilateral venous insufficiency wounds. There is less adherence surface left however the surface culture that I did last week grew pseudomonas therefore bilateral selective debridement score necessary. There is surrounding erythema. The patient describes severe bilateral drainage and a lot of pain in the left ankle. Apparently her podiatrist was were ready to do a cortisone shot 05/17/15; the patient complains of pain and again copious amounts of drainage. 05/24/15; we used Iodo flex last week. Patient notes considerable improvement in wound drainage. Only needed to change this once. 05/31/15; we continued Iodoflex; the base of these large wounds bilaterally is not too bad but there is probably likely a significant bioburden here. I would like to debridement just doesn't tolerate it. 06/06/14 I would like to continue the Iodoflex although she still hasn't managed to obtain supplies. She has bilateral medial malleoli or large wounds which are mostly superficial. Both of them are covered circumferentially with some nonviable fibrinous slough although she tolerates debridement very poorly. She apparently has an appointment for an ablation on the right leg by interventional radiology. 06/14/15; the patient arrives with the wounds and static condition. We attempted a debridement although she does not do well with this secondary to pain. I 07/05/15; wounds are not much smaller however there appears to be a cleaner granulating base. The left has tight fibrinous slough greater than the right. Debridement is tolerated poorly due to pain. Iodoflex is done more for these wounds in any of the multitude of different dressings I have tried on the left 1 and then subsequently the right. 07/12/15; no change in the condition of this wound. I am able to do an aggressive debridement on the right but not the  left. She simply cannot tolerate it. We have been using Iodoflex which helps somewhat. It is worthwhile remembering that at one point we healed the right medial ankle wound and the left was about 25% of the current circumference. We have suggested returning to vascular surgery for review of possible further ablations for one reason or another she has not been able to do this. 07/26/15 no major change in the condition of either wound on her medial ankle. I did not attempt to debridement of these. She has been aggressively scrubbing these while  she is in the shower at home. She has her supply of Iodoflex which seems to have done more for these wounds then anything I have put on recently. 08/09/15 wound area appears larger although not verified by measurements. Using Iodoflex 09/05/2015 -- she was here for avisit today but had significant problems with the wound and I was asked to see her for a physician opinion. I have summarize that this lady has had surgery on her left lower extremity about 10 years ago where the possible veins stripping was done. She has had an opinion from interventional radiology around November 2016 where no further sclerotherapy was ordered. The patient works 12 hours a day and stands on a concrete floor with work boots and is unable to get the proper compression she requires and cannot elevate her limbs appropriately at any given time. She has recently grown Pseudomonas from her wound culture but has not started her ciprofloxacin which was called in for her. 09/13/15 this continues to be a difficult situation for this patient. At one point I had this wound down to a 1.5 x 1.5" wound on her left leg. This is deteriorated and the right leg has reopened. She now has substantial wounds on her medial calcaneus, malleoli and into her lower leg. One on the left has surface eschar but these are far too painful for me to debridement here. She has a vascular surgery appointment next week to  see if anything can be done to help here. I think she has had previous ablations several years ago at [facility]. She has no major edema. She tells me that she did not get product last time [facility] and went for several days without it. She continues to work in work boots 12 hours a day. She cannot get compression/4-layer under her work boots. 09/20/15 no major change. Periwound edema control was not very good. Her point with pain and vascular is next Wednesday the 25th 09/28/15; the patient is seen vascular surgery and is apparently scheduled for repeat duplex ultrasounds of her bilateral lower legs next week. 10/05/15; the patient was seen by Dr. Doren Custard of vascular surgery. He feels that she should have arterial insufficiency excluded as cause/contributed to her nonhealing stage she is therefore booked for an arteriogram. She has apparently monophasic signals in the dorsalis pedis pulses. She also of course has known severe chronic venous insufficiency with previous procedures as noted previously. I had another long discussion with the patient today about her continuing to work 12 hour shifts. I've written her out for 2 months area had concerns about this as her work location is currently undergoing significant turmoil and this may lead to her termination. She is aware of this however I agree with her that she simply cannot continue to stand for 12 hours multiple days a week with the substantial wound areas she has. 10/19/15; the Dr. Doren Custard appointment was largely for an arteriogram which was normal. She does not have an arterial issue. He didn't make a comment about her chronic venous insufficiency for which she has had previous ablations. Presumably it was not felt that anything additional could be done. The patient is now out of work as I prescribed 2 weeks ago. Her wounds look somewhat less aggravated presumably because of this. I felt I would give debridement another try today 10/25/15; no major  change in this patient's wounds. We are struggling to get her product that she can afford into her own home through her insurance. 11/01/15;  no major change in the patient's wounds. I have been using silver alginate as the most affordable product. I spoke to Dr. Marla Roe last week with her requested take her to the OR for surgical debridement and placement of ACEL. Dr. Marla Roe told me that she would be willing to do this however [facility] will not cover this, fortunately the patient has [place] healthcare of some variant 11/08/15; no major change in the patient's wounds. She has been completely nonviable surface that this but is in too much pain with any attempted debridement are clinic. I have arranged for her to see Dr. Marla Roe ham of plastic surgery and this appointment is on Monday. I am hopeful that they will take her to the OR for debridement, possible ACEL ultimately possible skin graft 11/22/15 no major change in the patient's wounds over her bilateral medial calcaneus medial malleolus into the lower legs. Surface on these does not look too bad however on the left there is surrounding erythema and tenderness. This may be cellulitis or could him sleepy tinea. 11/29/15; no major changes in the patient's wounds over her bilateral medial malleolus. There is no infection here and I don't think any additional antibiotics are necessary. There is now plan to move forward. She sees Dr. Marla Roe in a week's time for preparation for operative debridement and ACEL placement I believe on 7/12. She then has a follow-up appointment with Dr. Marla Roe on 7/21 12/28/15; the patient returns today having been taken to the [place] by Dr. Marla Roe 12/12/15 she underwent debridement, intraoperative cultures [which were negative]. She had placement of a wound VAC. Parent really ACEL was not available to be placed. The wound VAC foam apparently adhered to the wound since then she's been using silver  alginate, Xeroform under Ace wraps. She still says there is a lot of drainage and a lot of pain 01/31/16; this is a patient I see monthly. I had referred her to Dr. Marla Roe him of plastic surgery for large wounds on her bilateral medial ankles. She has been to the OR twice once in early July and once in early August. She tells me over the last 3 weeks she has been using the wound VAC with ACEL underneath it. On the right we've simply been using silver alginate. Under Kerlix Coban wraps. 02/28/16; this is a patient I'm currently seeing monthly. She is gone on to have a skin graft over her large venous insufficiency ulcer on the left medial ankle. This was done by Dr. Marla Roe him. The patient is a bit perturbed about why she didn't have one on her right medial ankle wound. She has been using silver alginate to this. 03/06/16; I received a phone call from her plastic surgery Dr. Marla Roe. She expressed some concern about the viability of the skin graft she did on the left medial ankle wound. Asked me to place Endoform on this. She told me she is not planning to do a subsequent skin graft on the right as the left one did not take very well. I had placed Hydrofera Blue on the right 03/13/16; continue to have a reasonably healthy wound on the right medial ankle. Down to 3 mm in terms of size. There is epithelialization here. The area on the left medial ankle is her skin graft site. I suppose the last week this looks somewhat better. She has an open area inferiorly however in the center there appears to be some viable tissue. There is a lot of surface callus and  eschar that will eventually need to come off however none of this looked to be infected. Patient states that the is able to keep the dressing on for several days which is an improvement. 03/20/16 no major change in the circumference of either wound however on the left side the patient was at Dr. Eusebio Friendly office and they did a debridement of  left wound. 50% of the wound seems to be epithelialized. I been using Endoform on the left Hydrofera Blue in the right 03/27/16; she arrives today with her wound is not looking as healthy as they did last week. The area on the right clearly has an adherent surface to this a very similar surface on the left. Unfortunately for this patient this is all too familiar problem. Clearly the Endoform is not working and will need to change that today that has some potential to help this surface. She does not tolerate debridement in this clinic very well. She is changing the dressing wants 04/03/16; patient arrives with the wounds looking somewhat better especially on the right. Dr. Migdalia Dk change the dressing to silver alginate when she saw her on Monday and also sold her some compression socks. The usefulness of the latter is really not clear and woman with severely draining wounds. 04/10/16; the patient is doing a bit of an experiment wearing the compression stockings that Dr. Migdalia Dk provided her to her left leg and the out of legs based dressings that we provided to the right. 05/01/16; the patient is continuing to wear compression stockings Dr. Migdalia Dk provided her on the left that are apparently silver impregnated. She has been using Iodoflex to the right leg wound. Still a moderate amount of drainage, when she leaves here the wraps only last for 4 days. She has to change the stocking on the left leg every night 05/15/16; she is now using compression stockings bilaterally provided by Dr. Marla Roe. She is wearing a nonadherent layer over the wounds so really I don't think there is anything specific being done to this now. She has some reduction on the left wound. The right is stable. I think all healing here is being done without a specific dressing 06/09/16; patient arrives here today with not much change in the wound certainly in diameter to large circular wounds over the medial aspect of her  ankle bilaterally. Under the light of these services are certainly not viable for healing. There is no evidence of surrounding infection. She is wearing compression stockings with some sort of silver impregnation as prescribed by Dr. Marla Roe. She has a follow-up with her tomorrow. 06/30/16; no major change in the size or condition of her wounds. These are still probably covered with a nonviable surface. She is using only her purchase stockings. She did see Dr. Marla Roe who seemed to want to apply Dakin's solution to this I'm not extreme short what value this would be. I would suggest Iodoflex which she still has at home. 07/28/16; I follow Mrs. Northcott episodically along with Dr. Marla Roe. 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 She has been applying a topical collagen ointment to both wounds with Adaptic. I don't think Dr. Marla Roe is planning to take her back to the OR. 08/19/16; I follow Mrs. Jeneen Rinks on a monthly basis along with Dr. Marla Roe of plastic surgery. 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 I been following her for a number of years. At  one point I was able to get the right medial malleolus wound to heal and had the left medial malleolus down to about half its current size however and I had to send her to plastic surgery for an operative debridement. Since then things have been stable to slightly improve the area on the right is slightly better one in the left about the same although there is much less adherent surface than I'm used to with this patient. She is using some form of liquid collagen gel that Dr. Marla Roe provided a Kerlix cover with the patient's own pressure stockings. She tells me that she has extreme pain in both ankles and along the lateral aspect of both feet. She has been unable to work for some period of time. She is telling  me she is retiring at the beginning of April. She sees Dr. Doran Durand of orthopedics next week 09/22/16; patient has not seen Dr. Marla Roe since the last time she is here. I'm not really sure what she is using to the wounds other than bits and pieces of think she had left over including most recently Hydrofera Blue. She is using juxtalite stockings. She is having difficulty with her husband's recent illness "stroke". She is having to transport him to various doctors appointments. Dr. Marla Roe left her the option of a repeat debridement with ACEL however she has not been able to get the time to follow-up on this. She continues to have a fair amount of drainage out of these wounds with certainly precludes leaving dressings on all week 10/13/16; patient has not seen Dr. Marla Roe since she was last in our clinic. I'm not really sure what she is doing with the wounds, we did try to get her [facility] and I think she is actually using this most of the time. Because of drainage she states she has to change this every second day although this is an improvement from what she used to do. She went to see Dr. Doran Durand who did not think she had a muscular issue with regards to her feet, he referred her to a neurologist and I think the appointment is sometime in June. I changed her back to Iodoflex which she has used in the past but not recently. 11/03/16; the patient has been using Iodoflex although she ran out of this. Still claims that there is a lot of drainage although the wound does not look like this. No surrounding erythema. She has not been back to see Dr. Marla Roe 11/24/16; the patient has been using Iodoflex again but she ran out of it 2 or 3 days ago. There is no major change in the condition of either one of these wounds in fact they are larger and covered in a thick adherent surface slough/nonviable tissue especially on the left. She does not tolerate mechanical debridement in our clinic. Going back  to see Dr. Marla Roe of plastic surgery for an operative debridement would seem reasonable. 12/15/16; the patient has not been back to see Dr. Marla Roe. She is been dealing with a series of illnesses and her husband which of monopolized her time. She is been using Sorbact which we largely supplied. She states the drainage is bad enough that it maximum she can go 2-3 days without changing the dressing 01/12/2017 -- the patient has not been back for about 4 weeks and has not seen Dr. Marla Roe not does she have any appointment pending. 01/23/17; patient has not seen Dr. Marla Roe even though I suggested this previously. She is  using Santyl that was suggested last week by Dr. Con Memos this Cost her $16 through her insurance which is indeed surprising 02/12/17; continuing Santyl and the patient is changing this daily. A lot of drainage. She has not been back to see plastic surgery she is using an Ace wrap. Our intake nurse suggested wrap around stockings which would make a good reasonable alternative 02/26/17; patient is been using Santyl and changing this daily due to drainage. She has not been to see plastic surgery she uses in April Ace wrap to control the edema. She did obtain extremitease stockings but stated that the edema in her leg was to big for these 03/20/17; patient is using Santyl and Anasept. Surfaces looked better today the area on the right is actually measuring a little smaller. She has states she has a lot of pain in her feet and ankles and is asking for a consult to pain control which I'll try to help her with through our case manager. 04/10/17; the patient arrives with better-looking wound surfaces and is slightly smaller wound on the left she is using a combination of Santyl and Anasept. She has an appointment or at least as started in the pain control center associated with [facility] 05/14/17; this is a patient who I followed for a prolonged period of time. 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 At one point I had this down to a much smaller wound on the left however these reopened and we've never been able to get these to heal. She has been using Santyl and Anasept gel although 2 weeks ago she ran out of the Anasept gel. She has a stable appearance of the wound. She is going to the wound care clinic at [facility]. They wanted do a nerve block/spinal block although she tells me she is reluctant to go forward with that. 05/21/17; this is a patient I have followed for many years. 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 Chronic pain and deformity in her ankles as well. She is been to see plastic surgery as well as orthopedics. Using PolyMem AG most recently/Kerramax/ABDs and 2 layer compression. She has managed to keep this on and she is coming in for a nurse check to change the dressing on Tuesdays, we see her on Fridays 06/05/17; really quite a good looking surface and the area especially on the right medial has contracted in terms of dimensions. Well granulated healthy-looking tissue on both sides. Even with an open curet there is nothing that even feels abnormal here. This is as good as I've seen this in quite some time. We have been using PolyMem AG and bringing her in for a nurse check 06/12/17; really quite good surface on both of these wounds. The right medial has contracted a bit left is not. We've been using PolyMem and AG and she is coming in for a nurse visit 06/19/17; we have been using PolyMem AG and bringing her in for a nurse check. Dimensions of her wounds are not better but the surfaces looked better bilaterally. She complained of bleeding last night and the left wound and increasing pain bilaterally. She states her wound pain is more neuropathic than just the wounds. There was some suggestion that this was radicular from her pain management doctor in talking to her it is really difficult to sort  this out. 06/26/17; using PolyMem and AG and bringing her in for a nurse check as All of this and reasonably  stable condition. Certainly not improved. The dimensions on the lateral part of the right leg look better but not really measuring better. The medial aspect on the left is about the same. 07/03/16; we have been using PolyMen AG and bringing her in for a nurse check to change the dressings as the wounds have drainage which precludes once weekly changing. We are using all secondary absorptive dressings.our intake nurse is brought up the idea of using a wound VAC/snap VAC on the wound to help with the drainage to see if this would result in some contraction. This is not a bad idea. The area on the right medial is actually looking smaller. Both wounds have a reasonable-looking surface. There is no evidence of cellulitis. The edema is well controlled 07/10/17; the patient was denied for a snap VAC by her insurance. The major issue with these wounds continues to be drainage. We are using wicked PolyMem AG and she is coming in for a nurse visit to change this. The wounds are stable to slightly improved. The surface looks vibrant and the area on the right certainly has shrunk in size but very slowly 07/17/17; the patient still has large wounds on her bilateral medial malleoli. Surface of both of these wounds looks better. The dimensions seem to come and go but no consistent improvement. There is no epithelialization. We do not have options for advanced treatment products due to insurance issues. They did not approve of the wound VAC to help control the drainage. More recently we've been using PolyMem and AG wicked to allow drainage through. We have been bringing her in for a nurse visit to change this. We do not have a lot of options for wound care products and the home again due to insurance issues 07/24/17; the patient's wound actually looks somewhat better today. No drainage measurements are smaller still  healthy-looking surface. We used silver collagen under PolyMen started last week. We have been bringing her in for a dressing change 07/31/17; patient's wound surface continued to look better and I think there is visible change in the dimensions of the wound on the right. Rims of epithelialization. We have been using silver collagen under PolyMen and bringing her in for a dressing change. There appears to be less drainage although she is still in need of the dressing change 08/07/17. Patient's wound surface continues to look better on both sides and the area on the right is definitely smaller. We have been using silver collagen and PolyMen. She feels that the drainage has been it has been better. I asked her about her vascular status. She went to see Dr. Aleda Grana at [facility] and had some form of ablation. I don't have much detail on this. I haven't my notes from 2016 that she was not a candidate for any further ablation but I don't have any more information on this. We had referred her to vein and vascular I don't think she ever went. He does not have a history of PAD although I don't have any information on this either. We don't even have ABIs in our record 08/14/17; we've been using silver collagen and PolyMen cover. And putting the patient and compression. She we are bringing her in as a nurse visit to change this because ofarge amount of drainage. We didn't the ABIs in clinic today since they had been done in many moons 1.2 bilaterally. She has been to see vein and vascular however this was at [facility] and she had ablation although  I really don't have any information on this all seemed biking get a report. She is also been operatively debrided by plastic surgery and had a cell placed probably 8-12 months ago. This didn't have a major effect. We've been making some gains with current dressings 08/19/17-She is here in follow-up evaluation for bilateral medial malleoli ulcers. She continues  to tolerate debridement very poorly. We will continue with recently changed topical treatment; if no significant improvement may consider switching to Iodosorb/Iodoflex. 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 She has been using silver collagen and PolyMem. I believe she has been used and tried on Iodoflex before. 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 These have been very refractory over time. We have been using silver collagen and PolyMen. She is coming in once a week for a doctor's and once a week for nurse visits. We are actually making some progress 09/18/17; the patient's wounds are smaller especially on the right medial. She arrives today to upset to consider even washing these off with Anasept which I think is been part of the reason this is been closing. We've been using collagen covered in PolyMen otherwise. It is noted that she has a small area of folliculitis on the right medial calf that. As we are wrapping her legs I'll give her a short course of doxycycline to make sure this doesn't amount to anything. She is a long list of complaints today including imbalance, shortness of breath on exertion, inversion of her left ankle. With regards to the latter complaints she is been to see orthopedics and they offered her a tendon release surgery I believe but wanted her wounds to be closed first. I have recommended she go see her primary physician with regards to everything else. 09/25/17; patient's wounds are about the same size. We have made some progress bilaterally although not in recent weeks. She will not allow me T wash these wounds with Anasept even if she is doing her cell. Wheeze we've been using collagen covered in PolyMen. Last week she had a  small area of folliculitis this is now opened into a small wound. She completed 5 days of trimethoprim sulfamethoxazole 10/02/17; unfortunately the area on her left medial ankle is worse with a larger wound area towards the Achilles. The patient complains of a lot of pain. She will not allow debridement although visually I don't think there is anything to debridement in any case. We have been using silver collagen and PolyMen for several months now. Initially we are making some progress although I'm not really seeing that today. We will move back to [facility]. His admittedly this is a bit of a repeat however I'm hoping that his situation is different now. The patient tells me she had her leg on the left give out on her yesterday this is process some pain. 10/09/17; the patient is seen twice a week largely because of drainage issues coming out of the chronic medial bimalleolar wounds that are chronic. Last week the dimensions of the one on the left looks a little larger I changed her to [facility]. She comes in today with a history of terrible pain in the bilateral wound areas. She will not allow debridement. She will not even allow a tissue culture. There is no  surrounding erythema no no evidence of cellulitis. We have been putting her Kerlix Coban man. She will not allow more aggressive compression as there was a suggestion to put her in 3 layer wraps. 10/16/17; large wounds on her bilateral medial malleoli. These are chronic. Not much change from last week. The surface looks have healthy but absolutely no epithelialization. A lot of pain little less so of drainage. She will not allow debridement or even washing these off in the vigorous fashion with Anasept. 10/23/17; large wounds on her bilateral malleoli which are chronic. Some improvement in terms of size perhaps on the right since last time I saw these. She states that after we increased the 3 layer compression there was some bleeding, when  she came in for a nurse visit she did not want 3 layer compression put back on about our nurse managed to convince her. She has known chronic venous visit issues and I'm hoping to get her to tolerate the 3 layer compression. using Hydrofera Blue 10/30/17; absolutely no change in the condition of either wound although we've had some improvement in dimensions on the right.. Attempted to put her in 3 layer compression she didn't tolerated she is back in 2 layer compression. We've been using Hydrofera Blue We looked over her past records. She had venous reflux studies in November 2016. There was no evidence of deep venous reflux on the right. Superficial vein did not show the greater saphenous vein at think this is been previously ablated the small saphenous vein was within normal limits. The left deep venous system showed no DVT the vessels were positive for deep venous reflux in the posterior tibial veins at the ankle. The greater saphenous vein was surgically absent small saphenous vein was within normal limits. She went to vein and vascular at [facility]. I believe she had an ablation on the left greater saphenous vein. I'll update her reflux studies perhaps ever reviewed by vein and vascular. We've made absolutely no progress in these wounds. Will also try to read and TheraSkins through her insurance 11/06/17; W the patient apparently has a 2 week follow-up with vein and vascular I like him to review the whole issue with regards to her previous vascular workup by Dr. Aleda Grana. We've really made no progress on these wounds in many months. She arrives today with less viable looking surface on the left medial ankle wound. This was apparently looking about the same on Tuesday when she was here for nurse visit. 11/13/17; deep tissue culture I did last time of the left lower leg showed multiple organisms without any predominating. In particular no Staphylococcus or group A strep were isolated. We sent  her for venous reflux studies. She's had a previous left greater saphenous vein stripping and I think sclerotherapy of the right greater saphenous vein. She didn't really look at the lesser saphenous vein this both wounds are on the medial aspect. She has reflux in the common femoral vein and popliteal vein and an accessory vein on the right and the common femoral vein and popliteal vein on the left. I'm going to have her go to see vein and vascular just the look over things and see if anything else beside aggressive compression is indicated here. We have not been able to make any progress on these wounds in spite of the fact that the surface of the wounds is never look too bad. 11/20/17; no major change in the condition of the wounds. Patient reports a large amount of  drainage. She has a lot of complaints of pain although enlisting her today I wonder if some of this at least his neuropathic rather than secondary to her wounds. She has an appointment with vein and vascular on 12/30/17. The refractory nature of these wounds in my mind at least need vein and vascular to look over the wounds the recent reflux studies we did and her history to see if anything further can be done here. I also note her gait is deteriorated quite a bit. 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 She has a bilateral Trendelenburg gait. I wonder if this is neuropathic or perhaps multilevel radicular. 11/27/17; her wounds actually looks slightly better. Healthy-looking granulation tissue a scant amount of epithelialization. [place] healthcare will not pay for [employer]. They will play for tri layer Oasis and Dermagraft. This is not a diabetic ulcer. We'll try for the tri layer Oasis. She still complains of some drainage. She has a vein and vascular appointment on 12/30/17 12/04/17; the wounds visually look quite good. Healthy-looking granulation with some degree of epithelialization. We are still waiting for response to our  request for trial to try layer Oasis. Her appointment with vascular to review venous and arterial issues isn't sold the end of July 7/31. Not allow debridement or even vigorous cleansing of the wound surface. 12/18/17; slightly smaller especially on the right. 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 We've been using Hydrofera Blue. We still are looking into triple layer Oasis through her insurance 01/08/18 on evaluation today patient's wound actually appears to be showing signs of good improvement at this point in time. She has been tolerating the dressing changes without complication. Fortunately there does not appear to be any evidence of infection at this point in time. We have been utilizing silver nitrate which does seem to be of benefit for her which is also good news. Overall I'm very happy with how things seem to be both regards appearance as well as measurement. Patient did see Dr. Bridgett Larsson for evaluation on 12/30/17. In his assessment he felt that stripping would not likely add much more than chronic compression to the patient's healing process. His recommendation was to follow-up in three months with Dr. Doren Custard if she hasn't healed in order to consider referral back to you and see vascular where she previously was in a trial and was able to get her wound to heal. I'll be see what she feels she when you staying compression and he reiterated this as well. 01/13/18 on evaluation today patient appears to actually be doing very well in regard to her bilateral medial malleolus ulcers. She seems to have tolerated the chemical cauterization with silver nitrate last week she did have some pain through that evening but fortunately states that I'll be see since it seems to be doing better she is overall pleased with the progress. 01/21/18; really quite a remarkable improvement since I've last seen these wounds. We started using silver nitrate specially on the islands of hyper  granulation which for some reason her around the wound circumference. This is really done quite nicely. Primary dressing Hydrofera Blue under 4 layer compression. She seems to be able to hold out without a nurse rewrap. Follow-up in 1 week 01/28/18; we've continued the hydrofera blue but continued with chemical cauterization to the wound area that we started about a month ago for irregular hyper granulation. She is made almost stunning improvement in the overall wound dimensions. I was not really  expecting this degree of improvement in these chronic wounds 02/05/18; we continue with Hydrofera Bluebut of also continued the aggressive chemical cauterization with silver nitrate. We made nice progress with the right greater than left wound. 02/12/18. We continued with Hydrofera Blue after aggressive chemical cauterization with silver nitrate. We appear to be making nice progress with both wound areas 02/19/2018; we continue with [facility] after washing the wounds vigorously with Anasept spray and chemical cauterization with silver nitrate. We are making excellent progress. The area on the right's just about closed 02/26/2018. The area on the left medial ankle had too much necrotic debris today. I used a #5 curette we are able to get most of the soft. I continued with the silver nitrate to the much smaller wound on the right medial ankle she had a new area on her right lower pretibial area which she says was due to a role in her compression 03/05/2018; both wound areas look healthy. Not much change in dimensions from last week. I continue to use silver nitrate and Hydrofera Blue. The patient saw Dr. Doren Custard of vein and vascular. He felt she had venous stasis ulcers. He felt based on her previous arteriogram she should have adequate circulation for healing. Also she has deep venous reflux but really no significant correctable superficial venous reflux at this time. He felt we should continue with  conservative management including leg elevation and compression 04/02/2018; since we last saw this woman about a month ago she had a fall apparently suffered a pelvic fracture. I did not look up the x-ray. Nevertheless because of pain she literally was bedbound for 2 weeks and had home health coming out to change the dressing. Somewhat predictably this is resulted in considerable improvement in both wound areas. The right is just about closed on the medial malleolus and the left is about half the size. 04/16/2018; both her wounds continue to go down in size. Using Hydrofera Blue. 05/07/18; both her wounds appeared to be improving especially on the right where it is almost closed. We are using Hydrofera Blue 05/14/2018; slightly worse this week with larger wounds. Surface on the left medial not quite as good. We have been using Hydrofera Blue 05/21/18; again the wounds are slightly larger. Left medial malleolus slightly larger with eschar around the circumference. We have been using Hydrofera Blue undergoing a wraps for a prolonged period of time. This got a lot better when she was more recumbent due to a fall and a back injury. I change the primary dressing the silver alginate today. She did not tolerate a 4 layer compression previously although I may need to bring this up with her next time 05/28/2018; area on the left medial malleolus again is slightly larger with more drainage. Area on the right is roughly unchanged. She has a small area of folliculitis on the right medial just on the lower calf. This does not look ominous. 06/03/2018 left medial malleolus slightly smaller in a better looking surface. We used silver nitrate on this last time with silver alginate. The area on the right appears slightly smaller 1/10; left medial malleolus slightly smaller. Small open area on the right. We used silver nitrate and silver alginate as of 2 weeks ago. We continue with the wound and compression. These got a  lot better when she was off her feet 1/17; right medial malleolus wound is smaller. The left may be slightly smaller. Both surfaces look somewhat better. 1/24; both wounds are slightly smaller. Using silver  alginate under Unna boots 1/31; both wounds appear smaller in fact the area on the right medial is just about closed. Surface eschar. We have been using silver alginate under Unna boots. The patient is less active now spends let much less time on her feet and I think this is contributed to the general improvement in the wound condition 2/7; both wounds appear smaller. I was hopeful the right medial would be closed however there there is still the same small open area. Slight amount of surface eschar on the left the dimensions are smaller there is eschar but the wound edges appear to be free. We have been using silver alginate under Unna boot's 2/14; both wounds once again measure smaller. Circumferential eschar on the left medial. We have been using silver alginate under Unna boots with gradual improvement 2/21; the area on the right medial malleolus has healed. The area on the left is smaller. We have been using silver alginate and Unna boots. We can discharge wrapping the right leg she has 20/30 stockings at home she will need to protect the scar tissue in this area 2/28; the area on the right medial malleolus remains closed the patient has a compression stocking. The area on the left is smaller. We have been using silver alginate and Unna boots. 3/6 the area on the right medial ankle remains closed. Good edema control noted she is using her own compression stocking. The area on the left medial ankle is smaller. We have been managing this with silver alginate and Unna boots which we will continue today. 3/13; the area on the right medial ankle remains closed and I'm declaring it healed today. When necessary the left is about the same still a healthy-looking surface but no major change and wound  area. No evidence of infection and using silver alginate under unna and generally making considerable improvement 3/27 the area on the right medial ankle remains closed the area on the left is about the same as last week. Certainly not any worse we have been using silver alginate under an Unna boot 4/3; the area on the right medial ankle remains closed per the patient. We did not look at this wound. The wound on the left medial ankle is about the same surface looks healthy we have been using silver alginate under an Unna boot 4/10; area on the right medial ankle remains closed per the patient. We did not look at this wound. The wound on the left medial ankle is slightly larger. The patient complains that the [facility] caused burning pain all week. She also told us that she was a lot more active this week. Changed her back to silver alginate 4/17; right medial ankle still closed per the patient. Left medial ankle is slightly larger. Using silver alginate. She did not tolerate Hydrofera Blue on this area 4/24; right medial ankle remains closed we have not look at this. The left medial ankle continues to get larger today by about a centimeter. We have been using silver alginate under Unna boots. She complains about 4 layer compression as an alternative. She has been up on her feet working on her garden 5/8; right medial ankle remains closed we did not look at this. The left medial ankle has increased in size about 100%. We have been using silver alginate under Unna boots. She noted increased pain this week and was not surprised that the wound is deteriorated 5/15; no major change in SA however much less erythema ( one week  of doxy ocellulitis). 5/22-65 year old female returns at 1 week to the clinic for left medial ankle wound for which we have been using silver alginate under 3 layer compression She was placed on DOXY at last visit - the wound is wider at this visit. She is in 3 layer  compression 5/29; change to [facility] last week. I had given her empiric doxycycline 2 weeks ago for a week. She is in 3 layer compression. She complains of a lot of pain and drainage on presentation today. 6/5; using Hydrofera Blue. I gave her doxycycline recently empirically for erythema and pain around the wound. Believe her cultures showed enterococcus which not would not have been well covered by doxycycline nevertheless the wound looks better and I don't feel specifically that the enterococcus needs to be covered. She has a new what looks like a wrap injury on her lateral left ankle. 6/12; she is using Hydrofera Blue. She has a new area on the left anterior lower tibial area. This was a wrap injury last week. 6/19; the patient is using Hydrofera Blue. She arrived with marked inflammation and erythema around the wound and tenderness. 12/01/18 on evaluation today patient appears to be doing a little bit better based on what I'm hearing from the standpoint of lassos evaluation to this as far as the overall appearance of the wound is concerned. Then sometime substandard she typically sees Dr. Dellia Nims. Nonetheless overall very pleased with the progress that she's made up to this point. No fevers, chills, nausea, or vomiting noted at this time. 7/10; some improvement in the surface area. Aggressively debrided last week apparently. I went ahead with the debridement today although the patient does not tolerate this very well. We have been using Iodoflex. Still a fair amount of drainage 7/17; slightly smaller. Using Iodoflex. 7/24; no change from last week in terms of surface area. We have been using Iodoflex. Surface looks and continues to look somewhat better 7/31; surface area slightly smaller better looking surface. We have been using Iodoflex. This is under Unna boot compression 8/7-Patient presents at 1 week with Unna boot and Iodoflex, wound appears better 8/14-Patient presents at 1 week with  Iodoflex, we use the Unna boot, wound appears to be stable better.Patient is getting Botox treatment for the inversion of the foot for tendon release, Next week 8/21; we are using Iodoflex. Unna boot. The wound is stable in terms of surface area. Under illumination there is some areas of the wound that appear to be either epithelialized or perhaps this is adherent slough at this point I was not really clear. It did not wipe off and I was reluctant to debride this today. 8/28; we are using Iodoflex in an Unna boot. Seems to be making good improvement. 9/4; using Iodoflex and wound is slightly smaller. 9/18; we are using Iodoflex with topical silver nitrate when she is here. The wound continues to be smaller 10/2; patient missed her appointment last week due to GI issues. She left and Iodoflex based dressing on for 2 weeks. Wound is about the same size about the size of a dime on the left medial lower 10/9 we have been using Iodoflex on the medial left ankle wound. She has a new superficial probable wrap injury on the dorsal left ankle 10/16; we have been using Hydrofera Blue since last week. This is on the left medial ankle 10/23; we have been using Hydrofera Blue since 2 weeks ago. This is on the left medial ankle. Dimensions are  better 11/6; using Hydrofera Blue. I think the wound is smaller but still not closed. Left medial ankle 11/13; we have been using Hydrofera Blue. Wound is certainly no smaller this week. Also the surface not as good. This is the remanent of a very large area on her left medial ankle. 11/20; using Sorbact since last week. Wound was about the same in terms of size although I was disappointed about the surface debris 12/11; 3-week follow-up. Patient was on vacation. Wound is measuring slightly larger we have been using Sorbact. 12/18; wound is about the same size however surface looks better last week after debridement. We have been using Sorbact under compression 1/15 wound  is probably twice the size of last time increased in length nonviable surface. We have been using Sorbact. She was running a mild fever and missed her appointment last week 1/22; the wound is come down in size but under illumination still a very adherent debris we have been Hydrofera Blue that I changed her to last week 1/29; dimensions down slightly. We have been using Hydrofera Blue 2/19 dimensions are the same however there is rims of epithelialization under illumination. Therefore more the surface area may be epithelialized 2/26; the patient's wound actually measures smaller. The wound looks healthy. We have been using Hydrofera Blue. I had some thoughts about running Apligraf then I still may do that however this looks so much better this week we will delay that for now 3/5; the wound is small but about the same as last week. We have been using Hydrofera Blue. No debridement is required today. 3/19; the wound is about the size of a dime. Healthy looking wound even under illumination. We have been using Hydrofera Blue. No mechanical debridement is necessary 3/26; not much change from last week although still looks very healthy. We have been using Hydrofera Blue under Unna boots Patient was offered an ankle fusion by podiatry but not until the wound heals with a proceed with this. 4/9; the patient comes in today with her original wound on the medial ankle looking satisfactory however she has some uncontrolled swelling in the middle part of her leg with 2 new open areas superiorly just lateral to the tibia. I think this was probably a wrap issue. She said she felt uncomfortable during the week but did not call in. We have been using Hydrofera Blue 4/16; the wound on the medial ankle is about the same. She has innumerable small areas superior to this across her mid tibia. I think this is probably folliculitis. She is also been working in the yard doing a lot of sweating 4/30; the patient issue on  the upper areas across her mid tibia of all healed. I think this was excessive yard work if I remember. Her wound on the medial ankle is smaller. Some debris on this we have been using Hydrofera Blue under Unna boots 5/7; mid tibia. She has been using Hydrofera Blue under an Unna wrap. She is apparently going for her ankle surgery on June 3 10/28/19-Patient returns to clinic with the ankle wound, we are using Hydrofera Blue under Unna wrap, surgery is scheduled for her left foot for June 3 so she will be back for nurse visit next week READMISSION 01/17/2020 Mrs. Antczak is a 65 year old woman we have had in this clinic for a long period of time with severe venous hypertension and refractory wounds on her medial lower legs and ankles bilaterally. 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 When she was off her legs for a prolonged period example when she fell and suffered a compression fracture things would heal up quite nicely. She is now retired and we managed to heal up the right medial leg wound. The left one was very tiny last time I saw this although still refractory. 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 It got to the point where this was interfering with her walking and she elected to proceed with a ankle arthrodesis to straighten her her ankle and leave her with a functional outcome for mobilization. The patient was referred to Dr. Doren Custard and really this took some time to arrange. Dr. Doren Custard saw her on 12/07/2019. Once again he verified that she had no arterial issues. She had previously had an angiogram several years ago. Follow-up ABIs on the left showed an ABI of 1.12 with triphasic waveforms and a TBI of 0.92. She is felt to have chronic deep venous insufficiency but I do not think  it was felt that anything could be done from about this from an ablation point of view. At the time Dr. Doren Custard saw this patient the wounds actually look closed via the pictures in his clinic. The patient finally underwent her surgery on 12/15/2019. This went reasonably well and there was a good anatomic outcome. She developed a small distal wound dehiscence on the lateral part of the surgical wound. However more problematically she is developed recurrence of the wound on the medial left ankle. There are actually 2 wounds here one in the distal lower leg and 1 pretty much at the level of the medial malleolus. It is a more distal area that is more problematic. She has been using Hydrofera Blue which started on Friday before this she was simply Ace wrapping. There was a culture done that showed Pseudomonas and she is on ciprofloxacin. A recent CNS on 8/11 was negative. The patient reports some pain but I generally think this is improving. She is using a cam boot completely nonweightbearing using a walker for pivot transfers and a wheelchair 8/24; not much improvement unfortunately she has a surgical wound on the lateral part in the venous insufficiency wound medially. The bottom part of the medial insufficiency wound is still necrotic there is exposed tendon here. We have been using Hydrofera Blue under compression. Her edema control is however better 8/31; patient in for follow-up of his surgical wound on the lateral part of her left leg and chronic venous insufficiency ulcers medially. We put her back in compression last week. She comes in today with a complaint of 3 or 4 days worth of increasing pain. She felt her cam walker was rubbing on the area on the back of her heel. However there is intense erythema seems more likely she has cellulitis. She had 2 cultures done when she was seeing podiatry in the postop. One of them in late July showed Pseudomonas and she received a course of ciprofloxacin the  other was negative on 8/11 she is allergic to penicillin with anaphylactoid complaints of hives oral swelling via information in epic 9/9; when I saw this patient last week she had intense anterior erythema around her wound on the right lateral heel and ankle and also into the right medial heel. Some of this was no doubt  drainage and her walker boot however I was convinced she had cellulitis. I gave her Levaquin and Bactrim she is finishing up on this now. She is following up with Dr. [name] he saw her yesterday. He is taken her out of the walking boot of course she is still nonweightbearing. Her x-ray was negative for any worrisome features such as soft tissue air etc. Things are a lot better this week. She has home health. We have been using Hydrofera Blue under an [employer] which she put back on yesterday. I did not wrap her last week 9/17; her surrounding skin looks a lot better. In fact the area on the left lateral ankle has just a scant amount of eschar. The only remaining wound is the large area on the left medial ankle. Probably about 60% of this is healthy granulation at the surface however she has a significant divot distally. This has adherent debris in it. I been using debridement and silver collagen to try and get this area to fill-in although I do not think we have made much progress this week 9/24; the patient's wound on the left medial ankle looks a lot better. The deeper divot area distally still requires debridement but this is cleaning up quite nicely we have been using silver collagen. The patient is complaining of swelling in her foot and is worried that that is contributing to the nonhealing of the ankle wound. She is also complaining of numbness in her anterior toes 10/4; left medial ankle. The small area distally still has a divot with necrotic material that I have been debriding away. This has an undermining area. She is approved for Apligraf. She saw Dr. [name] her surgeon on  10/1. I think he declared himself is satisfied with the condition of things. Still nonweightbearing till the end of the month. 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 Her surgical wound is well closed. There is no evidence of infection 10/11; the patient arrived in clinic today with the expectation that we be able to put an Apligraf on this area after debridement however she arrives with a relatively large amount of green drainage on the dressing. The patient states that this started on Friday. She has not been systemically unwell. 10/19; culture I did last week showed both Enterococcus and Pseudomonas. I think this came in separate parts because I stopped her ciprofloxacin I gave her and prescribed her linezolid however now looking at the final culture result this is Pseudomonas which is resistant to quinolones. She has not yet picked up the linezolid apparently phone issues. We are also trying to get a topical antibiotic out of [employer] in [place] they can be applied by home health. She is still having green drainage 10/16; the patient has her topical antibiotic from [facility] in [place]. This is a compounded gel with vancomycin and ciprofloxacin and gentamicin. We are applying this on the wound bed with silver alginate over the top with Unna boot wraps. She arrives in clinic today with a lot less ominous looking drainage although she is only use this topical preparation once the second time today. She sees Dr. [name] her surgeon on Friday she has home health changing the dressing 11/2; still using her compounded topical antibiotic under silver alginate. Surface is cleaning up there is less drainage. We had an Apligraf for her today and I elected to apply it. A light coating of her antibiotic 04/25/2020 upon evaluation today patient appears to be  doing well with regard to her ankle ulcer. There is a little bit of slough noted on the surface of the  wound I am can have to perform sharp debridement to clear this away today. With that being said other than that fact overall I feel like she is making progress and we do see some new epithelial growth. There is also some improvement in the depth of the wound and that distal portion. There is little bit of slough there as well. 12/7; 2-week follow-up. Apligraf #3. Dimensions are smaller. Closing in especially inferiorly. Still some surface debris. Still using the [facility] topical antibiotic but I told her that I don't think this needs to be renewed 12/21; 2-week follow-up. Apligraf #4 dimensions are smaller. Nice improvement 06/05/2020; 2-week follow-up. The patient's wound on the left medial ankle looks really excellent. Nice granulation. Advancing epithelialization no undermining no evidence of infection. We would have to reapply for another Apligraf but with the condition of this wound I did not feel strongly about it. We used Hydrofera Blue under the same degree of compression. She follows up with Dr. [name] her surgeon a week Friday 06/13/2020 upon evaluation today patient appears to be doing excellent in regard to her wound. She has been tolerating the dressing changes without complication. Fortunately there is no signs of active infection at this time. No fevers, chills, nausea, vomiting, or diarrhea. She was using Hydrofera Blue last week. 06/20/2020 06/20/2020 on evaluation today patient actually appears to be doing excellent in regard to her wound. This is measuring better and looking much better as well. She has been using the collagen that seems to be doing better for her as well even though the [facility] was and is not sticking or feeling as rough on her wound. She did see Dr. [name] on Friday he is very pleased he also stated none of the hardware has shifted. That is great news 1/27; the patient has a small clean wound all that is remaining. I agree that this is too small to really  consider an Apligraf. Under illumination the surface is looking quite good. We have been using collagen although the dimensions are not any better this week 2/2; the patient has a small clean wound on the left medial ankle. Although this left of her substantial original areas. Measurements are smaller. We have been using polymen Ag under an Haematologist. 2/10; small area on the left medial ankle. This looks clean nothing to debride however dimensions are about the same we have been using polymen I think now for 2 weeks 2/17; not much change in surface area. We have been using polymen Ag without any improvement. 3/17; 1 month follow-up. The patient has been using endoform without any improvement in fact I think this looks worse with more depth and more expansion 3/24; no improvement. Perhaps less debris on the surface. We have been using Sorbact for 1 week Objective Constitutional Sitting or standing Blood Pressure is within target range for patient.. Pulse regular and within target range for patient.Marland Kitchen Respirations regular, non-labored and within target range.. Temperature is normal and within the target range for the patient.Marland Kitchen Appears in no distress. Vitals Time Taken: 10:12 AM, Height: 68 in, Temperature: 98.0 F, Pulse: 67 bpm, Respiratory Rate: 16 breaths/min, Blood Pressure: 115/55 mmHg. Cardiovascular Pedal pulses palpable.. General Notes: Wound exam; very fibrinous adherent slough removed with a #5 curette. I think there is less of this today. The surface generally looks better Gwenlyn Found Integumentary (Hair, Skin) Wound #  15 status is Open. Original cause of wound was Gradually Appeared. The date acquired was: 12/30/2019. The wound has been in treatment 31 weeks. The wound is located on the Left,Medial Malleolus. The wound measures 1.7cm length x 1.3cm width x 0.2cm depth; 1.736cm^2 area and 0.347cm^3 volume. There is Fat Layer (Subcutaneous Tissue) exposed. There is no tunneling or undermining  noted. There is a medium amount of serosanguineous drainage noted. The wound margin is well defined and not attached to the wound base. There is large (67-100%) red granulation within the wound bed. There is a small (1-33%) amount of necrotic tissue within the wound bed including Adherent Slough. 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 There was a Selective/Open Wound Non-Viable Tissue Debridement with a total area of 2.21 sq cm performed by [name]., MD. With the following instrument(s): Curette to remove Viable and Non-Viable tissue/material. Material removed includes [facility] after achieving pain control using Lidocaine 4% Topical Solution. A time out was conducted at 10:47, prior to the start of the procedure. A Minimum amount of bleeding was controlled with Pressure. The procedure was tolerated well with a pain level of 0 throughout and a pain level of 0 following the procedure. Post Debridement Measurements: 1.7cm length x 1.3cm width x 0.2cm depth; 0.347cm^3 volume. Character of Wound/Ulcer Post Debridement is improved. Severity of Tissue Post Debridement is: Fat layer exposed. Post procedure Diagnosis Wound #15: Same as Pre-Procedure Pre-procedure diagnosis of Wound #15 is a Venous Leg Ulcer located on the Left,Medial Malleolus . There was a Haematologist Compression Therapy Procedure by [name], RN. Post procedure Diagnosis Wound #15: Same as Pre-Procedure Plan Follow-up Appointments: Return Appointment in 1 week. Cellular or Tissue Based  Products: Cellular or Tissue Based Product Type: - run insurance authorization for apligraft. Bathing/ Shower/ Hygiene: May shower with protection but do not get wound dressing(s) wet. Edema Control - Lymphedema / SCD / Other: Elevate legs to the level of the heart or above for 30 minutes daily and/or when sitting, a frequency of: Avoid standing for long periods of time. Exercise regularly Compression stocking or Garment 20-30 mm/Hg pressure to: - right leg daily Additional Orders / Instructions: Other: - Apply triple antibiotic ointment to redden, scabbed areas to anterior area of left lower leg. WOUND #15: - Malleolus Wound Laterality: Left, Medial Cleanser: Soap and Water 1 x Per Week/15 Days Discharge Instructions: May shower and wash wound with dial antibacterial soap and water prior to dressing change. Peri-Wound Care: Sween Lotion (Moisturizing lotion) 1 x Per Week/15 Days Discharge Instructions: Apply moisturizing lotion as directed Prim Dressing: Cutimed Sorbact Swab 1 x Per Week/15 Days ary Discharge Instructions: Apply to wound bed as instructed Secondary Dressing: Woven Gauze Sponge, Non-Sterile 4x4 in (Home Health) 1 x Per Week/15 Days Discharge Instructions: Apply over primary dressing as directed. Secondary Dressing: ABD Pad, 5x9 (Home Health) 1 x Per Week/15 Days Discharge Instructions: Apply over primary dressing as directed. Com pression Wrap: Unnaboot w/Calamine, 4x10 (in/yd) (Home Health) 1 x Per Week/15 Days Discharge Instructions: Apply Unnaboot as directed. 1. Continue with the Sorbact for another week 2. The patient asks again about Apligraf.  We got an excellent response that this late last year and she wonders about reapplication. In fact I did not use the last 1 of this because the wound got so small it seemed to waste nevertheless if we were to get this approved again I would probably use it right down to closure. We will run this through her  Musician) Signed: 08/23/2020 4:40:49 PM By: Linton Ham MD Entered By: Linton Ham on 08/23/2020 11:10:38 -------------------------------------------------------------------------------- SuperBill Details Patient Name: Date of Service: Letitia Neri, [name] 08/23/2020 Medical Record Number: [id number] Patient Account Number: [id number] Date of Birth/Sex: Treating RN: 19-May-1956 (65 y.o. [name], Meta.Reding Primary Care Provider: [name] Other Clinician: Referring Provider: Treating Provider/Extender: [name] in Treatment: 31 Diagnosis Coding ICD-10 Codes Code Description [phone] Chronic venous hypertension (idiopathic) with ulcer and inflammation of left lower extremity L97.828 Non-pressure chronic ulcer of other part of left lower leg with other specified severity L97.328 Non-pressure chronic ulcer of left ankle with other specified severity Facility Procedures CPT4 Code: [id number] Description: [id number] - DEBRIDE WOUND 1ST 20 SQ CM OR < ICD-10 Diagnosis Description L97.328 Non-pressure chronic ulcer of left ankle with other specified severity Modifier: Quantity: 1 Physician Procedures : CPT4 Code Description Modifier [id number] - WC PHYS DEBR WO ANESTH 20 SQ CM ICD-10 Diagnosis Description L97.328 Non-pressure chronic ulcer of left ankle with other specified severity Quantity: 1 Electronic Signature(s) Signed: 08/23/2020 4:40:49 PM By: Linton Ham MD Entered By: Linton Ham on 08/23/2020 11:10:48

## 2020-08-29 NOTE — Progress Notes (Signed)
[name], [name] ([id number]) Visit Report for 08/23/2020 Arrival Information Details Patient Name: Date of Service: [place] [name], [name] 08/23/2020 9:45 A M Medical Record Number: [id number] Patient Account Number: [id number] Date of Birth/Sex: Treating RN: 12-Jul-1955 (65 y.o. [name] Primary Care [name]: [name] Other Clinician: Referring Shailey Butterbaugh: Treating Keshan Reha/Extender: [name] in Treatment: 78 Visit Information History Since Last Visit Added or deleted any medications: No Patient Arrived: [name] Any new allergies or adverse reactions: No Arrival Time: 10:08 Had a fall or experienced change in No Transfer Assistance: None activities of daily living that may affect Patient Identification Verified: Yes risk of falls: Secondary Verification Process Completed: Yes Signs or symptoms of abuse/neglect since last visito No Patient Requires Transmission-Based Precautions: No Hospitalized since last visit: No Patient Has Alerts: Yes Implantable device outside of the clinic excluding No Patient Alerts: L ABI =1.12, TBI = .92 cellular tissue based products placed in the center R ABI= 1.02, TBI= .58 since last visit: Has Dressing in Place as Prescribed: Yes Has Compression in Place as Prescribed: Yes Pain Present Now: No Electronic Signature(s) Signed: 08/24/2020 6:16:52 PM By: [name] Entered By: [name] on 08/23/2020 10:12:22 -------------------------------------------------------------------------------- Compression Therapy Details Patient Name: Date of Service: [name], [name] 08/23/2020 9:45 A M Medical Record Number: [id number] Patient Account Number: [id number] Date of Birth/Sex: Treating RN: January 31, 1956 (65 y.o. [name] Primary Care Deadrian Toya: [name] Other Clinician: Referring Yuniel Blaney: Treating [name]/Extender: [name] in Treatment: 31 Compression Therapy  Performed for Wound Assessment: Wound #15 Left,Medial Malleolus Performed By: Clinician [name], RN Compression Type: Rolena Infante Post Procedure Diagnosis Same as Pre-procedure Electronic Signature(s) Signed: 08/23/2020 4:43:39 PM By: Deon Pilling Entered By: Deon Pilling on 08/23/2020 11:09:27 -------------------------------------------------------------------------------- Encounter Discharge Information Details Patient Name: Date of Service: [facility] [name], [name] Kingfisher G. 08/23/2020 9:45 A M Medical Record Number: [id number] Patient Account Number: [id number] Date of Birth/Sex: Treating RN: [birth year] (65 y.o. [name], [name]: [name] Other Clinician: Referring Inez Stantz: Treating Mystique Bjelland/Extender: [name] in Treatment: 31 Encounter Discharge Information Items Post Procedure Vitals Discharge Condition: Stable Temperature (F): 98 Ambulatory Status: Ambulatory Pulse (bpm): 67 Discharge Destination: Home Respiratory Rate (breaths/min): 16 Transportation: Private Auto Blood Pressure (mmHg): 115/55 Accompanied By: self Schedule Follow-up Appointment: Yes Clinical Summary of Care: Patient Declined Electronic Signature(s) Signed: 08/23/2020 5:01:28 PM By: [name] RN Entered By: [name] on 08/23/2020 11:11:32 -------------------------------------------------------------------------------- Lower Extremity Assessment Details Patient Name: Date of Service: [facility] [name], [name] 08/23/2020 9:45 A M Medical Record Number: [id number] Patient Account Number: [id number] Date of Birth/Sex: Treating RN: 13-Sep-1955 (65 y.o. [name] Primary Care Tonnie Stillman: [name] Other Clinician: Referring Koren Plyler: Treating [name]/Extender: [name] in Treatment: 31 Edema Assessment Assessed: [name]: No] [name]: No] Edema: [Left: Ye] [Right: s] Calf Left: Right: Point of  Measurement: 31 cm From Medial Instep 25 cm Ankle Left: Right: Point of Measurement: 11 cm From Medial Instep 21.5 cm Vascular Assessment Pulses: Dorsalis Pedis Palpable: [Left:Yes] Electronic Signature(s) Signed: 08/24/2020 6:16:52 PM By: [name] Entered By: [name] on 08/23/2020 10:16:50 -------------------------------------------------------------------------------- Multi Wound Chart Details Patient Name: Date of Service: [name], [name] 08/23/2020 9:45 A M Medical Record Number: [id number] Patient Account Number: [id number] Date of Birth/Sex: Treating RN: [birth year] (65 y.o. [name] Primary Care Hudsyn Champine: [name] Other Clinician: Referring Kail Fraley: Treating [name]/Extender: Dellia Nims  Myrlene Broker Weeks in Treatment: 31 Vital Signs Height(in): 68 Pulse(bpm): 79 Weight(lbs): Blood Pressure(mmHg): 115/55 Body Mass Index(BMI): Temperature(F): 98.0 Respiratory Rate(breaths/min): 16 Photos: [15:No Photos Left, Medial Malleolus] [N/A:N/A N/A] Wound Location: [15:Gradually Appeared] [N/A:N/A] Wounding Event: [15:Venous Leg Ulcer] [N/A:N/A] Primary Etiology: [15:Peripheral Venous Disease,] [N/A:N/A] Comorbid History: [15:Osteoarthritis, Confinement Anxiety 12/30/2019] [N/A:N/A] Date Acquired: [15:31] [N/A:N/A] Weeks of Treatment: [15:Open] [N/A:N/A] Wound Status: [15:1.7x1.3x0.2] [N/A:N/A] Measurements L x W x D (cm) [15:1.736] [N/A:N/A] A (cm) : rea [15:0.347] [N/A:N/A] Volume (cm) : [15:85.10%] [N/A:N/A] % Reduction in A [15:rea: 96.30%] [N/A:N/A] % Reduction in Volume: [15:Full Thickness With Exposed Support N/A] Classification: [15:Structures Medium] [N/A:N/A] Exudate A mount: [15:Serosanguineous] [N/A:N/A] Exudate Type: [15:red, brown] [N/A:N/A] Exudate Color: [15:Well defined, not attached] [N/A:N/A] Wound Margin: [15:Large (67-100%)] [N/A:N/A] Granulation A mount: [15:Red] [N/A:N/A] Granulation Quality: [15:Small  (1-33%)] [N/A:N/A] Necrotic A mount: [15:Fat Layer (Subcutaneous Tissue): Yes N/A] Exposed Structures: [15:Fascia: No Tendon: No Muscle: No Joint: No Bone: No Medium (34-66%)] [N/A:N/A] Epithelialization: [15:Debridement - Selective/Open Wound N/A] Debridement: Pre-procedure Verification/Time Out 10:47 [N/A:N/A] Taken: [15:Lidocaine 4% Topical Solution] [N/A:N/A] Pain Control: [15:Slough] [N/A:N/A] Tissue Debrided: [15:Non-Viable Tissue] [N/A:N/A] Level: [15:2.21] [N/A:N/A] Debridement A (sq cm): [15:rea Curette] [N/A:N/A] Instrument: [15:Minimum] [N/A:N/A] Bleeding: [15:Pressure] [N/A:N/A] Hemostasis A chieved: [15:0] [N/A:N/A] Procedural Pain: [15:0] [N/A:N/A] Post Procedural Pain: [15:Procedure was tolerated well] [N/A:N/A] Debridement Treatment Response: [15:1.7x1.3x0.2] [N/A:N/A] Post Debridement Measurements L x W x D (cm) [15:0.347] [N/A:N/A] Post Debridement Volume: (cm) [15:Debridement] [N/A:N/A] Treatment Notes Electronic Signature(s) Signed: 08/23/2020 4:40:49 PM By: Linton Ham MD Signed: 08/23/2020 4:43:39 PM By: Deon Pilling Entered By: Linton Ham on 08/23/2020 11:06:50 -------------------------------------------------------------------------------- Multi-Disciplinary Care Plan Details Patient Name: Date of Service: [facility] [name], [name] 08/23/2020 9:45 A M Medical Record Number: [id number] Patient Account Number: [id number] Date of Birth/Sex: Treating RN: 1955-12-19 (65 y.o. [name] Primary Care Ima Hafner: [name] Other Clinician: Referring Ayda Tancredi: Treating Keyante Durio/Extender: [name] in Treatment: 30 Multidisciplinary Care Plan reviewed with physician Active Inactive Venous Leg Ulcer Nursing Diagnoses: Actual venous Insuffiency (use after diagnosis is confirmed) Knowledge deficit related to disease process and management Goals: Patient will maintain optimal edema control Date Initiated:  06/20/2020 Target Resolution Date: 08/31/2020 Goal Status: Active Interventions: Assess peripheral edema status every visit. Compression as ordered Treatment Activities: Therapeutic compression applied : 06/20/2020 Notes: Wound/Skin Impairment Nursing Diagnoses: Impaired tissue integrity Knowledge deficit related to ulceration/compromised skin integrity Goals: Patient/caregiver will verbalize understanding of skin care regimen Date Initiated: 01/17/2020 Target Resolution Date: 08/31/2020 Goal Status: Active Ulcer/skin breakdown will have a volume reduction of 30% by week 4 Date Initiated: 01/17/2020 Date Inactivated: 03/12/2020 Target Resolution Date: 03/09/2020 Goal Status: Met Interventions: Assess patient/caregiver ability to obtain necessary supplies Assess patient/caregiver ability to perform ulcer/skin care regimen upon admission and as needed Assess ulceration(s) every visit Provide education on ulcer and skin care Notes: Electronic Signature(s) Signed: 08/23/2020 4:43:39 PM By: Deon Pilling Entered By: Deon Pilling on 08/23/2020 10:43:20 -------------------------------------------------------------------------------- Pain Assessment Details Patient Name: Date of Service: [facility] [name], [name] 08/23/2020 9:45 A M Medical Record Number: [id number] Patient Account Number: [id number] Date of Birth/Sex: Treating RN: [birth year] (65 y.o. [name] Primary Care Bleu Minerd: [name] Other Clinician: Referring Kelijah Towry: Treating Efrain Clauson/Extender: [name] in Treatment: 31 Active Problems Location of Pain Severity and Description of Pain Patient Has Paino Yes Site Locations Pain Location: Pain in Ulcers With Dressing Change: Yes Duration of the Pain. Constant / Intermittento Intermittent Rate the pain. Current  Pain Level: 3 Character of Pain Describe the Pain: Burning, Tender Pain Management and Medication Current Pain  Management: Medication: Yes Cold Application: No Rest: Yes Massage: No Activity: No T.E.N.S.: No Heat Application: No Leg drop or elevation: No Is the Current Pain Management Adequate: Inadequate How does your wound impact your activities of daily livingo Sleep: No Bathing: No Appetite: No Relationship With Others: No Bladder Continence: No Emotions: No Bowel Continence: No Work: No Toileting: No Drive: No Dressing: No Hobbies: No Electronic Signature(s) Signed: 08/24/2020 6:16:52 PM By: [name] Entered By: [name] on 08/23/2020 10:16:29 -------------------------------------------------------------------------------- Patient/Caregiver Education Details Patient Name: Date of Service: [name], [name] 3/24/2022andnbsp9:45 A M Medical Record Number: [id number] Patient Account Number: [id number] Date of Birth/Gender: Treating RN: Mar 15, 1956 (65 y.o. [name] Primary Care Physician: [name] Other Clinician: Referring Physician: Treating Physician/Extender: [name] in Treatment: 30 Education Assessment Education Provided To: Patient Education Topics Provided Wound/Skin Impairment: Handouts: Skin Care Do's and Dont's Methods: Explain/Verbal Responses: Reinforcements needed Electronic Signature(s) Signed: 08/23/2020 4:43:39 PM By: Deon Pilling Entered By: Deon Pilling on 08/23/2020 10:43:34 -------------------------------------------------------------------------------- Wound Assessment Details Patient Name: Date of Service: [facility] [name], [name] 08/23/2020 9:45 A M Medical Record Number: [id number] Patient Account Number: [id number] Date of Birth/Sex: Treating RN: [birth year] (64 y.o. [name] Primary Care Diyan Dave: [name] Other Clinician: Referring Averyana Pillars: Treating Lorel Lembo/Extender: [name] in Treatment: 31 Wound Status Wound Number: 15 Primary Venous Leg  Ulcer Etiology: Wound Location: Left, Medial Malleolus Wound Status: Open Wounding Event: Gradually Appeared Comorbid Peripheral Venous Disease, Osteoarthritis, Confinement Date Acquired: 12/30/2019 History: Anxiety Weeks Of Treatment: 31 Clustered Wound: No Photos Wound Measurements Length: (cm) 1.7 Width: (cm) 1.3 Depth: (cm) 0.2 Area: (cm) 1.736 Volume: (cm) 0.347 % Reduction in Area: 85.1% % Reduction in Volume: 96.3% Epithelialization: Medium (34-66%) Tunneling: No Undermining: No Wound Description Classification: Full Thickness With Exposed Support Structures Wound Margin: Well defined, not attached Exudate Amount: Medium Exudate Type: Serosanguineous Exudate Color: red, brown Foul [name] After Cleansing: No Slough/Fibrino Yes Wound Bed Granulation Amount: Large (67-100%) Exposed Structure Granulation Quality: Red Fascia Exposed: No Necrotic Amount: Small (1-33%) Fat Layer (Subcutaneous Tissue) Exposed: Yes Necrotic Quality: Adherent Slough Tendon Exposed: No Muscle Exposed: No Joint Exposed: No Bone Exposed: No Treatment Notes Wound #15 (Malleolus) Wound Laterality: Left, Medial Cleanser Soap and Water Discharge Instruction: May shower and wash wound with dial antibacterial soap and water prior to dressing change. Peri-Wound Care Sween Lotion (Moisturizing lotion) Discharge Instruction: Apply moisturizing lotion as directed Topical Primary Dressing Cutimed Sorbact Swab Discharge Instruction: Apply to wound bed as instructed Secondary Dressing Woven Gauze Sponge, Non-Sterile 4x4 in Discharge Instruction: Apply over primary dressing as directed. ABD Pad, 5x9 Discharge Instruction: Apply over primary dressing as directed. Secured With Compression Wrap Unnaboot w/Calamine, 4x10 (in/yd) Discharge Instruction: Apply Unnaboot as directed. Compression Stockings Add-Ons Electronic Signature(s) Signed: 08/24/2020 6:16:52 PM By: [name] Signed:  08/29/2020 9:05:34 AM By: [name] Entered By: [name] on 08/23/2020 16:26:21 -------------------------------------------------------------------------------- Vitals Details Patient Name: Date of Service: [name], [name] 08/23/2020 9:45 A M Medical Record Number: [id number] Patient Account Number: [id number] Date of Birth/Sex: Treating RN: [birth year] (65 y.o. [name] Primary Care Evalene Vath: [name] Other Clinician: Referring Damani Kelemen: Treating Azara Gemme/Extender: [name] in Treatment: 31 Vital Signs Time Taken: 10:12 Temperature (F): 98.0 Height (in): 68 Pulse (bpm): 67 Respiratory Rate (breaths/min): 16 Blood  Pressure (mmHg): 115/55 Reference Range: 80 - 120 mg / dl Electronic Signature(s) Signed: 08/24/2020 6:16:52 PM By: [name] Entered By: [name] on 08/23/2020 10:12:53

## 2020-08-30 ENCOUNTER — Telehealth: Payer: Self-pay

## 2020-08-30 ENCOUNTER — Encounter (HOSPITAL_BASED_OUTPATIENT_CLINIC_OR_DEPARTMENT_OTHER): Payer: Medicare Other | Admitting: Internal Medicine

## 2020-08-30 NOTE — Telephone Encounter (Signed)
Patient called requesting a refill on her pain medication   Please advise

## 2020-08-31 ENCOUNTER — Other Ambulatory Visit: Payer: Self-pay | Admitting: Podiatry

## 2020-08-31 NOTE — Telephone Encounter (Signed)
sent 

## 2020-09-03 ENCOUNTER — Other Ambulatory Visit: Payer: Self-pay

## 2020-09-03 ENCOUNTER — Encounter (HOSPITAL_BASED_OUTPATIENT_CLINIC_OR_DEPARTMENT_OTHER): Payer: Medicare Other | Attending: Internal Medicine | Admitting: Internal Medicine

## 2020-09-03 DIAGNOSIS — L97328 Non-pressure chronic ulcer of left ankle with other specified severity: Secondary | ICD-10-CM | POA: Insufficient documentation

## 2020-09-03 DIAGNOSIS — I87332 Chronic venous hypertension (idiopathic) with ulcer and inflammation of left lower extremity: Secondary | ICD-10-CM | POA: Diagnosis present

## 2020-09-03 DIAGNOSIS — L97828 Non-pressure chronic ulcer of other part of left lower leg with other specified severity: Secondary | ICD-10-CM | POA: Insufficient documentation

## 2020-09-03 NOTE — Progress Notes (Signed)
[name], [name] ([id number]) Visit Report for 09/03/2020 Debridement Details Patient Name: Date of Service: [name] [name], [name] [name] 09/03/2020 10:00 A M Medical Record Number: [id number] Patient Account Number: [id number] Date of Birth/Sex: Treating RN: 1955-08-07 (65 y.o. [name] Primary Care Provider: [name] Other Clinician: Referring Provider: Treating Provider/Extender: [name] in Treatment: 32 Debridement Performed for Assessment: Wound #15 Left,Medial Malleolus Performed By: Physician [name]., MD Debridement Type: Debridement Severity of Tissue Pre Debridement: Fat layer exposed Level of Consciousness (Pre-procedure): Awake and Alert Pre-procedure Verification/Time Out Yes - 10:36 Taken: Start Time: 10:36 T Area Debrided (L x W): otal 2.2 (cm) x 1.6 (cm) = 3.52 (cm) Tissue and other material debrided: Viable, Non-Viable, Slough, Subcutaneous, Slough Level: Skin/Subcutaneous Tissue Debridement Description: Excisional Instrument: Curette Bleeding: Minimum Hemostasis Achieved: Pressure End Time: 10:37 Procedural Pain: 4 Post Procedural Pain: 2 Response to Treatment: Procedure was tolerated well Level of Consciousness (Post- Awake and Alert procedure): Post Debridement Measurements of Total Wound Length: (cm) 2.2 Width: (cm) 1.6 Depth: (cm) 0.2 Volume: (cm) 0.553 Character of Wound/Ulcer Post Debridement: Improved Severity of Tissue Post Debridement: Fat layer exposed Post Procedure Diagnosis Same as Pre-procedure Electronic Signature(s) Signed: 09/03/2020 4:59:32 PM By: Linton Ham MD Signed: 09/03/2020 5:00:21 PM By: Levan Hurst RN, BSN Entered By: Linton Ham on 09/03/2020 10:46:41 -------------------------------------------------------------------------------- HPI Details Patient Name: Date of Service: [name], [name]. 09/03/2020 10:00 A M Medical Record Number: [id number] Patient Account Number:  [id number] Date of Birth/Sex: Treating RN: 19-Dec-1955 (65 y.o. [name] Primary Care Provider: [name] Other Clinician: Referring Provider: Treating Provider/Extender: [name] in Treatment: 32 History of Present Illness HPI Description: the remaining wound is over the left medial ankle. Similar wound over the right medial ankle healed largely with use of Apligraf. Most recently we have been using Hydrofera Blue over this wound with considerable improvement. The patient has been extensively worked up in the past for her venous insufficiency and she is not a candidate for antireflux surgery although I have none of the details available currently. 08/24/14; considerable improvement today. About 50% of this wound areas now epithelialized. The base of the wound appears to be healthier granulation.as opposed to last week when she had deteriorated a considerable improvement 08/17/14; unfortunately the wound has regressed somewhat. The areas of epithelialization from the superior aspect are not nearly as healthy as they were last week. The patient thinks her Hydrofera Blue slipped. 09/07/14; unfortunately the area has markedly regressed in the 2 weeks since I've seen this. There is an odor surrounding erythema. The healthy granulation tissue that we had at the base of the wound now is a dusky color. The nurse reports green drainage 09/14/14; the area looks somewhat better than last week. There is less erythema and less drainage. The culture I did did not show any growth. Nevertheless I think it is better to continue the Cipro and doxycycline for a further week. The remaining wound area was debridement. 09/21/14. Wound did not require debridement last week. Still less erythema and less drainage. She can complete her antibiotics. The areas of epithelialization in the superior aspect of the wound do not look as healthy as they did some weeks ago 10/05/14 continued  improvement in the condition of this wound. There is advancing epithelialization. Less aggressive debridement required 10/19/14 continued improvement in the condition and volume of this wound. Less aggressive debridement to the inferior part of this to  remove surface slough and fibrinous eschar 11/02/14 no debridement is required. The surface granulation appears healthy although some of her islands of epithelialization seem to have regressed. No evidence of infection 11/16/14; lites surface debridement done of surface eschar. The wound does not look to be unhealthy. No evidence of infection. Unfortunately the patient has had podiatry issues in the right foot and for some reason has redeveloped small surface ulcerations in the medial right ankle. Her original presentation involved wounds in this area 11/23/14 no debridement. The area on the right ankle has enlarged. The left ankle wound appears stable in terms of the surface although there is periwound inflammation. There has been regression in the amount of new skin 11/30/14 no debridement. Both wound areas appear healthy. There was no evidence of infection. The the new area on the right medial ankle has enlarged although that both the surfaces appear to be stable. 12/07/14; Debridement of the right medial ankle wound. No no debridement was done on the left. 12/14/14 no major change in and now bilateral medial ankle wounds. Both of these are very painful but the no overt evidence of infection. She has had previous venous ablation 12/21/14; patient states that her right medial ankle wound is considerably more painful last week than usual. Her left is also somewhat painful. She could not tolerate debridement. The right medial ankle wound has fibrinous surface eschar 12/28/14 this is a patient with severe bilateral venous insufficiency ulcers. For a considerable period of time we actually had the one on the right medial ankle healed however this recently opened  up again in June. The left medial ankle wound has been a refractory area with some absent flows. We had some success with Hydrofera Blue on this area and it literally closed by 50% however it is recently opened up Foley. Both of these were debridement today of surface eschar. She tolerates this poorly 01/25/15: No change in the status of this. Thick adherent escar. Very poor tolerance of any attempt at debridement. I had healed the right medial malleolus wound for a considerable amount of time and had the left one down to about 50% of the volume although this is totally regressed over the last 48 weeks. Further the right leg has reopened. she is trying to make a appointment with pain and vascular, previous ablations with Dr. Aleda Grana. I do not believe there is an arterial insufficiency issue here 02/01/15 the status of the adherent eschar bilaterally is actually improved. No debridement was done. She did not manage to get vascular studies done 02/08/15 continued debridement of the area was done today. The slough is less adherent and comes off with less pressure. There is no surrounding infection peripheral pulses are intact 02/15/15 selective debridement with a disposable curette. Again the slough is less adherent and comes off with less difficulty. No surrounding infection peripheral pulses are intact. 02/22/15 selective debridement of the right medial ankle wound. Slough comes off with less difficulty. No obvious surrounding infection peripheral pulses are intact I did not debridement the one on the left. Both of these are stable to improved 03/01/15 selective debridement of both wound areas using a curette to. Adherent slough cup soft with less difficulty. No obvious surrounding infection. The patient tells me that 2 days ago she noted a rash above the right leg wrap. She did not have this on her lower legs when she change this over she arrives with widespread left greater than right almost  folliculitis-looking rash which is extremely pruritic.  I don't see anything to culture here. There is no rash on the rest of her body. She feels well systemically. 03/08/15; selective debridement of both wounds using a curette. Base of this does not look unhealthy. She had limegreen drainage coming out of the left leg wound and describes a lot of drainage. The rash on her left leg looks improved to. No cultures were done. 03/22/15; patient was not here last week. Basal wounds does not look healthy and there is no surrounding erythema. No drainage. There is still a rash on the left leg that almost looks vasculitic however it is clearly limited to the top of where the wrap would be. 04/05/15; on the right required a surgical debridement of surface eschar and necrotic subcutaneous tissue. I did not debridement the area on the left. These continue to be large open wounds that are not changing that much. We were successful at one point in healing the area on the right, and at the same time the area on the left was roughly half the size of current measurements. I think a lot of the deterioration has to do with the prolonged time the patient is on her feet at work 04/19/15 I attempted-like surface debridement bilaterally she does not tolerate this. She tells me that she was in allergic care yesterday with extreme pain over her left lateral malleolus/ankle and was told that she has an "sprain" 05/03/15; large bilateral venous insufficiency wounds over the medial malleolus/medial aspect of her ankles. She complains of copious amounts of drainage and his usual large amounts of pain. There is some increasing erythema around the wound on the right extending into the medial aspect of her foot to. historically she came in with these wounds the right one healed and the left one came down to roughly half its current size however the right one is reopened and the left is expanded. 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 05/10/15 large bilateral venous insufficiency wounds. There is less adherence surface left however the surface culture that I did last week grew pseudomonas therefore bilateral selective debridement score necessary. There is surrounding erythema. The patient describes severe bilateral drainage and a lot of pain in the left ankle. Apparently her podiatrist was were ready to do a cortisone shot 05/17/15; the patient complains of pain and again copious amounts of drainage. 05/24/15; we used Iodo flex last week. Patient notes considerable improvement in wound drainage. Only needed to change this once. 05/31/15; we continued Iodoflex; the base of these large wounds bilaterally is not too bad but there is probably likely a significant bioburden here. I would like to debridement just doesn't tolerate it. 06/06/14 I would like to continue the Iodoflex although she still hasn't managed to obtain supplies. She has bilateral medial malleoli or large wounds which are mostly superficial. Both of them are covered circumferentially with some nonviable fibrinous slough although she tolerates debridement very poorly. She apparently has an appointment for an ablation on the right leg by interventional radiology. 06/14/15; the patient arrives with the wounds and static condition. We attempted a debridement although she does not do well with this secondary to pain. I 07/05/15; wounds are not much smaller however there appears to be a cleaner granulating base. The left has tight fibrinous slough greater than the right. Debridement is tolerated poorly due to pain. Iodoflex is done more for these wounds in any of the multitude  of different dressings I have tried on the left 1 and then subsequently the right. 07/12/15; no change in the condition of this wound. I am able to do an aggressive debridement on the right but not the left. She simply cannot tolerate it. We have been  using Iodoflex which helps somewhat. It is worthwhile remembering that at one point we healed the right medial ankle wound and the left was about 25% of the current circumference. We have suggested returning to vascular surgery for review of possible further ablations for one reason or another she has not been able to do this. 07/26/15 no major change in the condition of either wound on her medial ankle. I did not attempt to debridement of these. She has been aggressively scrubbing these while she is in the shower at home. She has her supply of Iodoflex which seems to have done more for these wounds then anything I have put on recently. 08/09/15 wound area appears larger although not verified by measurements. Using Iodoflex 09/05/2015 -- she was here for avisit today but had significant problems with the wound and I was asked to see her for a physician opinion. I have summarize that this lady has had surgery on her left lower extremity about 10 years ago where the possible veins stripping was done. She has had an opinion from interventional radiology around November 2016 where no further sclerotherapy was ordered. The patient works 12 hours a day and stands on a concrete floor with work boots and is unable to get the proper compression she requires and cannot elevate her limbs appropriately at any given time. She has recently grown Pseudomonas from her wound culture but has not started her ciprofloxacin which was called in for her. 09/13/15 this continues to be a difficult situation for this patient. At one point I had this wound down to a 1.5 x 1.5" wound on her left leg. This is deteriorated and the right leg has reopened. She now has substantial wounds on her medial calcaneus, malleoli and into her lower leg. One on the left has surface eschar but these are far too painful for me to debridement here. She has a vascular surgery appointment next week to see if anything can be done to help here. I  think she has had previous ablations several years ago at [facility]. She has no major edema. She tells me that she did not get product last time [facility]] and went for several days without it. She continues to work in work boots 12 hours a day. She cannot get compression/4-layer under her work boots. 09/20/15 no major change. Periwound edema control was not very good. Her point with pain and vascular is next Wednesday the 25th 09/28/15; the patient is seen vascular surgery and is apparently scheduled for repeat duplex ultrasounds of her bilateral lower legs next week. 10/05/15; the patient was seen by Dr. Doren Custard of vascular surgery. He feels that she should have arterial insufficiency excluded as cause/contributed to her nonhealing stage she is therefore booked for an arteriogram. She has apparently monophasic signals in the dorsalis pedis pulses. She also of course has known severe chronic venous insufficiency with previous procedures as noted previously. I had another long discussion with the patient today about her continuing to work 12 hour shifts. I've written her out for 2 months area had concerns about this as her work location is currently undergoing significant turmoil and this may lead to her termination. She is aware of this however  I agree with her that she simply cannot continue to stand for 12 hours multiple days a week with the substantial wound areas she has. 10/19/15; the Dr. Doren Custard appointment was largely for an arteriogram which was normal. She does not have an arterial issue. He didn't make a comment about her chronic venous insufficiency for which she has had previous ablations. Presumably it was not felt that anything additional could be done. The patient is now out of work as I prescribed 2 weeks ago. Her wounds look somewhat less aggravated presumably because of this. I felt I would give debridement another try today 10/25/15; no major change in this patient's wounds. We are  struggling to get her product that she can afford into her own home through her insurance. 11/01/15; no major change in the patient's wounds. I have been using silver alginate as the most affordable product. I spoke to Dr. Marla Roe last week with her requested take her to the OR for surgical debridement and placement of ACEL. Dr. Marla Roe told me that she would be willing to do this however [facility] will not cover this, fortunately the patient has [place] healthcare of some variant 11/08/15; no major change in the patient's wounds. She has been completely nonviable surface that this but is in too much pain with any attempted debridement are clinic. I have arranged for her to see Dr. Marla Roe ham of plastic surgery and this appointment is on Monday. I am hopeful that they will take her to the OR for debridement, possible ACEL ultimately possible skin graft 11/22/15 no major change in the patient's wounds over her bilateral medial calcaneus medial malleolus into the lower legs. Surface on these does not look too bad however on the left there is surrounding erythema and tenderness. This may be cellulitis or could him sleepy tinea. 11/29/15; no major changes in the patient's wounds over her bilateral medial malleolus. There is no infection here and I don't think any additional antibiotics are necessary. There is now plan to move forward. She sees Dr. Marla Roe in a week's time for preparation for operative debridement and ACEL placement I believe on 7/12. She then has a follow-up appointment with Dr. Marla Roe on 7/21 12/28/15; the patient returns today having been taken to the [place] by Dr. Marla Roe 12/12/15 she underwent debridement, intraoperative cultures [which were negative]. She had placement of a wound VAC. Parent really ACEL was not available to be placed. The wound VAC foam apparently adhered to the wound since then she's been using silver alginate, Xeroform under Ace wraps. She still  says there is a lot of drainage and a lot of pain 01/31/16; this is a patient I see monthly. I had referred her to Dr. Marla Roe him of plastic surgery for large wounds on her bilateral medial ankles. She has been to the OR twice once in early July and once in early August. She tells me over the last 3 weeks she has been using the wound VAC with ACEL underneath it. On the right we've simply been using silver alginate. Under Kerlix Coban wraps. 02/28/16; this is a patient I'm currently seeing monthly. She is gone on to have a skin graft over her large venous insufficiency ulcer on the left medial ankle. This was done by Dr. Marla Roe him. The patient is a bit perturbed about why she didn't have one on her right medial ankle wound. She has been using silver alginate to this. 03/06/16; I received a phone call from her plastic  surgery Dr. Marla Roe. She expressed some concern about the viability of the skin graft she did on the left medial ankle wound. Asked me to place Endoform on this. She told me she is not planning to do a subsequent skin graft on the right as the left one did not take very well. I had placed Hydrofera Blue on the right 03/13/16; continue to have a reasonably healthy wound on the right medial ankle. Down to 3 mm in terms of size. There is epithelialization here. The area on the left medial ankle is her skin graft site. I suppose the last week this looks somewhat better. She has an open area inferiorly however in the center there appears to be some viable tissue. There is a lot of surface callus and eschar that will eventually need to come off however none of this looked to be infected. Patient states that the is able to keep the dressing on for several days which is an improvement. 03/20/16 no major change in the circumference of either wound however on the left side the patient was at Dr. Eusebio Friendly office and they did a debridement of left wound. 50% of the wound seems to be  epithelialized. I been using Endoform on the left Hydrofera Blue in the right 03/27/16; she arrives today with her wound is not looking as healthy as they did last week. The area on the right clearly has an adherent surface to this a very similar surface on the left. Unfortunately for this patient this is all too familiar problem. Clearly the Endoform is not working and will need to change that today that has some potential to help this surface. She does not tolerate debridement in this clinic very well. She is changing the dressing wants 04/03/16; patient arrives with the wounds looking somewhat better especially on the right. Dr. Migdalia Dk change the dressing to silver alginate when she saw her on Monday and also sold her some compression socks. The usefulness of the latter is really not clear and woman with severely draining wounds. 04/10/16; the patient is doing a bit of an experiment wearing the compression stockings that Dr. Migdalia Dk provided her to her left leg and the out of legs based dressings that we provided to the right. 05/01/16; the patient is continuing to wear compression stockings Dr. Migdalia Dk provided her on the left that are apparently silver impregnated. She has been using Iodoflex to the right leg wound. Still a moderate amount of drainage, when she leaves here the wraps only last for 4 days. She has to change the stocking on the left leg every night 05/15/16; she is now using compression stockings bilaterally provided by Dr. Marla Roe. She is wearing a nonadherent layer over the wounds so really I don't think there is anything specific being done to this now. She has some reduction on the left wound. The right is stable. I think all healing here is being done without a specific dressing 06/09/16; patient arrives here today with not much change in the wound certainly in diameter to large circular wounds over the medial aspect of her ankle bilaterally. Under the light of these services are  certainly not viable for healing. There is no evidence of surrounding infection. She is wearing compression stockings with some sort of silver impregnation as prescribed by Dr. Marla Roe. She has a follow-up with her tomorrow. 06/30/16; no major change in the size or condition of her wounds. These are still probably covered with a nonviable surface. She is using only  her purchase stockings. She did see Dr. Marla Roe who seemed to want to apply Dakin's solution to this I'm not extreme short what value this would be. I would suggest Iodoflex which she still has at home. 07/28/16; I follow Mrs. Deangelo episodically along with Dr. Marla Roe. 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 She has been applying a topical collagen ointment to both wounds with Adaptic. I don't think Dr. Marla Roe is planning to take her back to the OR. 08/19/16; I follow Mrs. Jeneen Rinks on a monthly basis along with Dr. Marla Roe of plastic surgery. 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 I been following her for a number of years. At one point I was able to get the right medial malleolus wound to heal and had the left medial malleolus down to about half its current size however and I had to send her to plastic surgery for an operative debridement. Since then things have been stable to slightly improve the area on the right is slightly better one in the left about the same although there is much less adherent surface than I'm used to with this patient. She is using some form of liquid collagen gel that Dr. Marla Roe provided a Kerlix cover with the patient's own pressure stockings. She tells me that she has extreme pain in both ankles and along the lateral aspect of both feet. She has been unable to work for some period of time. She is telling me she is retiring at the beginning of April. She sees Dr.  Doran Durand of orthopedics next week 09/22/16; patient has not seen Dr. Marla Roe since the last time she is here. I'm not really sure what she is using to the wounds other than bits and pieces of think she had left over including most recently Hydrofera Blue. She is using juxtalite stockings. She is having difficulty with her husband's recent illness "stroke". She is having to transport him to various doctors appointments. Dr. Marla Roe left her the option of a repeat debridement with ACEL however she has not been able to get the time to follow-up on this. She continues to have a fair amount of drainage out of these wounds with certainly precludes leaving dressings on all week 10/13/16; patient has not seen Dr. Marla Roe since she was last in our clinic. I'm not really sure what she is doing with the wounds, we did try to get her [facility] and I think she is actually using this most of the time. Because of drainage she states she has to change this every second day although this is an improvement from what she used to do. She went to see Dr. Doran Durand who did not think she had a muscular issue with regards to her feet, he referred her to a neurologist and I think the appointment is sometime in June. I changed her back to Iodoflex which she has used in the past but not recently. 11/03/16; the patient has been using Iodoflex although she ran out of this. Still claims that there is a lot of drainage although the wound does not look like this. No surrounding erythema. She has not been back to see Dr. Marla Roe 11/24/16; the patient has been using Iodoflex again but she ran out of it 2 or 3 days ago. There is no major change in the condition of either one of these wounds in fact they are larger and covered  in a thick adherent surface slough/nonviable tissue especially on the left. She does not tolerate mechanical debridement in our clinic. Going back to see Dr. Marla Roe of plastic surgery for an operative  debridement would seem reasonable. 12/15/16; the patient has not been back to see Dr. Marla Roe. She is been dealing with a series of illnesses and her husband which of monopolized her time. She is been using Sorbact which we largely supplied. She states the drainage is bad enough that it maximum she can go 2-3 days without changing the dressing 01/12/2017 -- the patient has not been back for about 4 weeks and has not seen Dr. Marla Roe not does she have any appointment pending. 01/23/17; patient has not seen Dr. Marla Roe even though I suggested this previously. She is using Santyl that was suggested last week by Dr. Con Memos this Cost her $16 through her insurance which is indeed surprising 02/12/17; continuing Santyl and the patient is changing this daily. A lot of drainage. She has not been back to see plastic surgery she is using an Ace wrap. Our intake nurse suggested wrap around stockings which would make a good reasonable alternative 02/26/17; patient is been using Santyl and changing this daily due to drainage. She has not been to see plastic surgery she uses in April Ace wrap to control the edema. She did obtain extremitease stockings but stated that the edema in her leg was to big for these 03/20/17; patient is using Santyl and Anasept. Surfaces looked better today the area on the right is actually measuring a little smaller. She has states she has a lot of pain in her feet and ankles and is asking for a consult to pain control which I'll try to help her with through our case manager. 04/10/17; the patient arrives with better-looking wound surfaces and is slightly smaller wound on the left she is using a combination of Santyl and Anasept. She has an appointment or at least as started in the pain control center associated with [facility] 05/14/17; this is a patient who I followed for a prolonged period of time. 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 At one  point I had this down to a much smaller wound on the left however these reopened and we've never been able to get these to heal. She has been using Santyl and Anasept gel although 2 weeks ago she ran out of the Anasept gel. She has a stable appearance of the wound. She is going to the wound care clinic at [facility]. They wanted do a nerve block/spinal block although she tells me she is reluctant to go forward with that. 05/21/17; this is a patient I have followed for many years. 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 Chronic pain and deformity in her ankles as well. She is been to see plastic surgery as well as orthopedics. Using PolyMem AG most recently/Kerramax/ABDs and 2 layer compression. She has managed to keep this on and she is coming in for a nurse check to change the dressing on Tuesdays, we see her on Fridays 06/05/17; really quite a good looking surface and the area especially on the right medial has contracted in terms of dimensions. Well granulated healthy-looking tissue on both sides. Even with an open curet there is nothing that even feels abnormal here. This is as good as I've seen this in quite some time. We have been using PolyMem AG and bringing her in for a nurse check 06/12/17;  really quite good surface on both of these wounds. The right medial has contracted a bit left is not. We've been using PolyMem and AG and she is coming in for a nurse visit 06/19/17; we have been using PolyMem AG and bringing her in for a nurse check. Dimensions of her wounds are not better but the surfaces looked better bilaterally. She complained of bleeding last night and the left wound and increasing pain bilaterally. She states her wound pain is more neuropathic than just the wounds. There was some suggestion that this was radicular from her pain management doctor in talking to her it is really difficult to sort this out. 06/26/17; using PolyMem and AG and bringing her  in for a nurse check as All of this and reasonably stable condition. Certainly not improved. The dimensions on the lateral part of the right leg look better but not really measuring better. The medial aspect on the left is about the same. 07/03/16; we have been using PolyMen AG and bringing her in for a nurse check to change the dressings as the wounds have drainage which precludes once weekly changing. We are using all secondary absorptive dressings.our intake nurse is brought up the idea of using a wound VAC/snap VAC on the wound to help with the drainage to see if this would result in some contraction. This is not a bad idea. The area on the right medial is actually looking smaller. Both wounds have a reasonable-looking surface. There is no evidence of cellulitis. The edema is well controlled 07/10/17; the patient was denied for a snap VAC by her insurance. The major issue with these wounds continues to be drainage. We are using wicked PolyMem AG and she is coming in for a nurse visit to change this. The wounds are stable to slightly improved. The surface looks vibrant and the area on the right certainly has shrunk in size but very slowly 07/17/17; the patient still has large wounds on her bilateral medial malleoli. Surface of both of these wounds looks better. The dimensions seem to come and go but no consistent improvement. There is no epithelialization. We do not have options for advanced treatment products due to insurance issues. They did not approve of the wound VAC to help control the drainage. More recently we've been using PolyMem and AG wicked to allow drainage through. We have been bringing her in for a nurse visit to change this. We do not have a lot of options for wound care products and the home again due to insurance issues 07/24/17; the patient's wound actually looks somewhat better today. No drainage measurements are smaller still healthy-looking surface. We used silver collagen under  PolyMen started last week. We have been bringing her in for a dressing change 07/31/17; patient's wound surface continued to look better and I think there is visible change in the dimensions of the wound on the right. Rims of epithelialization. We have been using silver collagen under PolyMen and bringing her in for a dressing change. There appears to be less drainage although she is still in need of the dressing change 08/07/17. Patient's wound surface continues to look better on both sides and the area on the right is definitely smaller. We have been using silver collagen and PolyMen. She feels that the drainage has been it has been better. I asked her about her vascular status. She went to see Dr. Aleda Grana at [facility] and had some form of ablation. I don't have much detail on this.  I haven't my notes from 2016 that she was not a candidate for any further ablation but I don't have any more information on this. We had referred her to vein and vascular I don't think she ever went. He does not have a history of PAD although I don't have any information on this either. We don't even have ABIs in our record 08/14/17; we've been using silver collagen and PolyMen cover. And putting the patient and compression. She we are bringing her in as a nurse visit to change this because ofarge amount of drainage. We didn't the ABIs in clinic today since they had been done in many moons 1.2 bilaterally. She has been to see vein and vascular however this was at [facility] and she had ablation although I really don't have any information on this all seemed biking get a report. She is also been operatively debrided by plastic surgery and had a cell placed probably 8-12 months ago. This didn't have a major effect. We've been making some gains with current dressings 08/19/17-She is here in follow-up evaluation for bilateral medial malleoli ulcers. She continues to tolerate debridement very poorly. We will continue  with recently changed topical treatment; if no significant improvement may consider switching to Iodosorb/Iodoflex. 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 She has been using silver collagen and PolyMem. I believe she has been used and tried on Iodoflex before. 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 These have been very refractory over time. We have been using silver collagen and PolyMen. She is coming in once a week for a doctor's and once a week for nurse visits. We are actually making some progress 09/18/17; the patient's wounds are smaller especially on the right medial. She arrives today to upset to consider even washing these off with Anasept which I think is been part of the reason this is been closing. We've been using collagen covered in PolyMen otherwise. It is noted that she has a small area of folliculitis on the right medial calf that. As we are wrapping her legs I'll give her a short course of doxycycline to make sure this doesn't amount to anything. She is a long list of complaints today including imbalance, shortness of breath on exertion, inversion of her left ankle. With regards to the latter complaints she is been to see orthopedics and they offered her a tendon release surgery I believe but wanted her wounds to be closed first. I have recommended she go see her primary physician with regards to everything else. 09/25/17; patient's wounds are about the same size. We have made some progress bilaterally although not in recent weeks. She will not allow me T wash these wounds with Anasept even if she is doing her cell. Wheeze we've been using collagen covered in PolyMen. Last week she had a small area of folliculitis this is now opened into a  small wound. She completed 5 days of trimethoprim sulfamethoxazole 10/02/17; unfortunately the area on her left medial ankle is worse with a larger wound area towards the Achilles. The patient complains of a lot of pain. She will not allow debridement although visually I don't think there is anything to debridement in any case. We have been using silver collagen and PolyMen for several months now. Initially  we are making some progress although I'm not really seeing that today. We will move back to [facility]. His admittedly this is a bit of a repeat however I'm hoping that his situation is different now. The patient tells me she had her leg on the left give out on her yesterday this is process some pain. 10/09/17; the patient is seen twice a week largely because of drainage issues coming out of the chronic medial bimalleolar wounds that are chronic. Last week the dimensions of the one on the left looks a little larger I changed her to [facility]. She comes in today with a history of terrible pain in the bilateral wound areas. She will not allow debridement. She will not even allow a tissue culture. There is no surrounding erythema no no evidence of cellulitis. We have been putting her Kerlix Coban man. She will not allow more aggressive compression as there was a suggestion to put her in 3 layer wraps. 10/16/17; large wounds on her bilateral medial malleoli. These are chronic. Not much change from last week. The surface looks have healthy but absolutely no epithelialization. A lot of pain little less so of drainage. She will not allow debridement or even washing these off in the vigorous fashion with Anasept. 10/23/17; large wounds on her bilateral malleoli which are chronic. Some improvement in terms of size perhaps on the right since last time I saw these. She states that after we increased the 3 layer compression there was some bleeding, when she came in for a nurse visit she did not want 3 layer  compression put back on about our nurse managed to convince her. She has known chronic venous visit issues and I'm hoping to get her to tolerate the 3 layer compression. using Hydrofera Blue 10/30/17; absolutely no change in the condition of either wound although we've had some improvement in dimensions on the right.. Attempted to put her in 3 layer compression she didn't tolerated she is back in 2 layer compression. We've been using Hydrofera Blue We looked over her past records. She had venous reflux studies in November 2016. There was no evidence of deep venous reflux on the right. Superficial vein did not show the greater saphenous vein at think this is been previously ablated the small saphenous vein was within normal limits. The left deep venous system showed no DVT the vessels were positive for deep venous reflux in the posterior tibial veins at the ankle. The greater saphenous vein was surgically absent small saphenous vein was within normal limits. She went to vein and vascular at [facility]. I believe she had an ablation on the left greater saphenous vein. I'll update her reflux studies perhaps ever reviewed by vein and vascular. We've made absolutely no progress in these wounds. Will also try to read and TheraSkins through her insurance 11/06/17; W the patient apparently has a 2 week follow-up with vein and vascular I like him to review the whole issue with regards to her previous vascular workup by Dr. Aleda Grana. We've really made no progress on these wounds in many months. She arrives today with less viable looking surface on the left medial ankle wound. This was apparently looking about the same on Tuesday when she was here for nurse visit. 11/13/17; deep tissue culture I did last time of the left lower leg showed multiple organisms without any predominating. In particular no Staphylococcus or group A strep were isolated. We sent her for venous reflux studies. She's had a previous  left greater saphenous vein stripping and I think sclerotherapy of the right greater saphenous vein. She didn't really look at the lesser saphenous vein this both wounds are on the medial aspect. She has reflux in the common femoral vein and popliteal vein and an accessory vein on the right and the common femoral vein and popliteal vein on the left. I'm going to have her go to see vein and vascular just the look over things and see if anything else beside aggressive compression is indicated here. We have not been able to make any progress on these wounds in spite of the fact that the surface of the wounds is never look too bad. 11/20/17; no major change in the condition of the wounds. Patient reports a large amount of drainage. She has a lot of complaints of pain although enlisting her today I wonder if some of this at least his neuropathic rather than secondary to her wounds. She has an appointment with vein and vascular on 12/30/17. The refractory nature of these wounds in my mind at least need vein and vascular to look over the wounds the recent reflux studies we did and her history to see if anything further can be done here. I also note her gait is deteriorated quite a bit. 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 She has a bilateral Trendelenburg gait. I wonder if this is neuropathic or perhaps multilevel radicular. 11/27/17; her wounds actually looks slightly better. Healthy-looking granulation tissue a scant amount of epithelialization. [place] healthcare will not pay for [employer]. They will play for tri layer Oasis and Dermagraft. This is not a diabetic ulcer. We'll try for the tri layer Oasis. She still complains of some drainage. She has a vein and vascular appointment on 12/30/17 12/04/17; the wounds visually look quite good. Healthy-looking granulation with some degree of epithelialization. We are still waiting for response to our request for trial to try layer Oasis. Her  appointment with vascular to review venous and arterial issues isn't sold the end of July 7/31. Not allow debridement or even vigorous cleansing of the wound surface. 12/18/17; slightly smaller especially on the right. 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 We've been using Hydrofera Blue. We still are looking into triple layer Oasis through her insurance 01/08/18 on evaluation today patient's wound actually appears to be showing signs of good improvement at this point in time. She has been tolerating the dressing changes without complication. Fortunately there does not appear to be any evidence of infection at this point in time. We have been utilizing silver nitrate which does seem to be of benefit for her which is also good news. Overall I'm very happy with how things seem to be both regards appearance as well as measurement. Patient did see Dr. Bridgett Larsson for evaluation on 12/30/17. In his assessment he felt that stripping would not likely add much more than chronic compression to the patient's healing process. His recommendation was to follow-up in three months with Dr. Doren Custard if she hasn't healed in order to consider referral back to you and see vascular where she previously was in a trial and was able to get her wound to heal. I'll be see what she feels she when you staying compression and he reiterated this as well. 01/13/18 on evaluation today patient appears to actually be doing very well in regard to her bilateral medial malleolus ulcers. She seems to have tolerated the chemical cauterization with silver nitrate last week she did  have some pain through that evening but fortunately states that I'll be see since it seems to be doing better she is overall pleased with the progress. 01/21/18; really quite a remarkable improvement since I've last seen these wounds. We started using silver nitrate specially on the islands of hyper granulation which for some reason her around  the wound circumference. This is really done quite nicely. Primary dressing Hydrofera Blue under 4 layer compression. She seems to be able to hold out without a nurse rewrap. Follow-up in 1 week 01/28/18; we've continued the hydrofera blue but continued with chemical cauterization to the wound area that we started about a month ago for irregular hyper granulation. She is made almost stunning improvement in the overall wound dimensions. I was not really expecting this degree of improvement in these chronic wounds 02/05/18; we continue with Hydrofera Bluebut of also continued the aggressive chemical cauterization with silver nitrate. We made nice progress with the right greater than left wound. 02/12/18. We continued with Hydrofera Blue after aggressive chemical cauterization with silver nitrate. We appear to be making nice progress with both wound areas 02/19/2018; we continue with [facility] after washing the wounds vigorously with Anasept spray and chemical cauterization with silver nitrate. We are making excellent progress. The area on the right's just about closed 02/26/2018. The area on the left medial ankle had too much necrotic debris today. I used a #5 curette we are able to get most of the soft. I continued with the silver nitrate to the much smaller wound on the right medial ankle she had a new area on her right lower pretibial area which she says was due to a role in her compression 03/05/2018; both wound areas look healthy. Not much change in dimensions from last week. I continue to use silver nitrate and Hydrofera Blue. The patient saw Dr. Doren Custard of vein and vascular. He felt she had venous stasis ulcers. He felt based on her previous arteriogram she should have adequate circulation for healing. Also she has deep venous reflux but really no significant correctable superficial venous reflux at this time. He felt we should continue with conservative management including leg elevation and  compression 04/02/2018; since we last saw this woman about a month ago she had a fall apparently suffered a pelvic fracture. I did not look up the x-ray. Nevertheless because of pain she literally was bedbound for 2 weeks and had home health coming out to change the dressing. Somewhat predictably this is resulted in considerable improvement in both wound areas. The right is just about closed on the medial malleolus and the left is about half the size. 04/16/2018; both her wounds continue to go down in size. Using Hydrofera Blue. 05/07/18; both her wounds appeared to be improving especially on the right where it is almost closed. We are using Hydrofera Blue 05/14/2018; slightly worse this week with larger wounds. Surface on the left medial not quite as good. We have been using Hydrofera Blue 05/21/18; again the wounds are slightly larger. Left medial malleolus slightly larger with eschar around the circumference. We have been using Hydrofera Blue undergoing a wraps for a prolonged period of time. This got a lot better when she was more recumbent due to a fall and a back injury. I change the primary dressing the silver alginate today. She did not tolerate a 4 layer compression previously although I may need to bring this up with her next time 05/28/2018; area on the left medial malleolus again is  slightly larger with more drainage. Area on the right is roughly unchanged. She has a small area of folliculitis on the right medial just on the lower calf. This does not look ominous. 06/03/2018 left medial malleolus slightly smaller in a better looking surface. We used silver nitrate on this last time with silver alginate. The area on the right appears slightly smaller 1/10; left medial malleolus slightly smaller. Small open area on the right. We used silver nitrate and silver alginate as of 2 weeks ago. We continue with the wound and compression. These got a lot better when she was off her feet 1/17; right  medial malleolus wound is smaller. The left may be slightly smaller. Both surfaces look somewhat better. 1/24; both wounds are slightly smaller. Using silver alginate under Unna boots 1/31; both wounds appear smaller in fact the area on the right medial is just about closed. Surface eschar. We have been using silver alginate under Unna boots. The patient is less active now spends let much less time on her feet and I think this is contributed to the general improvement in the wound condition 2/7; both wounds appear smaller. I was hopeful the right medial would be closed however there there is still the same small open area. Slight amount of surface eschar on the left the dimensions are smaller there is eschar but the wound edges appear to be free. We have been using silver alginate under Unna boot's 2/14; both wounds once again measure smaller. Circumferential eschar on the left medial. We have been using silver alginate under Unna boots with gradual improvement 2/21; the area on the right medial malleolus has healed. The area on the left is smaller. We have been using silver alginate and Unna boots. We can discharge wrapping the right leg she has 20/30 stockings at home she will need to protect the scar tissue in this area 2/28; the area on the right medial malleolus remains closed the patient has a compression stocking. The area on the left is smaller. We have been using silver alginate and Unna boots. 3/6 the area on the right medial ankle remains closed. Good edema control noted she is using her own compression stocking. The area on the left medial ankle is smaller. We have been managing this with silver alginate and Unna boots which we will continue today. 3/13; the area on the right medial ankle remains closed and I'm declaring it healed today. When necessary the left is about the same still a healthy-looking surface but no major change and wound area. No evidence of infection and using silver  alginate under unna and generally making considerable improvement 3/27 the area on the right medial ankle remains closed the area on the left is about the same as last week. Certainly not any worse we have been using silver alginate under an Unna boot 4/3; the area on the right medial ankle remains closed per the patient. We did not look at this wound. The wound on the left medial ankle is about the same surface looks healthy we have been using silver alginate under an Unna boot 4/10; area on the right medial ankle remains closed per the patient. We did not look at this wound. The wound on the left medial ankle is slightly larger. The patient complains that the [facility] caused burning pain all week. She also told us that she was a lot more active this week. Changed her back to silver alginate 4/17; right medial ankle still closed per the  patient. Left medial ankle is slightly larger. Using silver alginate. She did not tolerate Hydrofera Blue on this area 4/24; right medial ankle remains closed we have not look at this. The left medial ankle continues to get larger today by about a centimeter. We have been using silver alginate under Unna boots. She complains about 4 layer compression as an alternative. She has been up on her feet working on her garden 5/8; right medial ankle remains closed we did not look at this. The left medial ankle has increased in size about 100%. We have been using silver alginate under Unna boots. She noted increased pain this week and was not surprised that the wound is deteriorated 5/15; no major change in SA however much less erythema ( one week of doxy ocellulitis). 5/22-65 year old female returns at 1 week to the clinic for left medial ankle wound for which we have been using silver alginate under 3 layer compression She was placed on DOXY at last visit - the wound is wider at this visit. She is in 3 layer compression 5/29; change to [facility] last week. I had  given her empiric doxycycline 2 weeks ago for a week. She is in 3 layer compression. She complains of a lot of pain and drainage on presentation today. 6/5; using Hydrofera Blue. I gave her doxycycline recently empirically for erythema and pain around the wound. Believe her cultures showed enterococcus which not would not have been well covered by doxycycline nevertheless the wound looks better and I don't feel specifically that the enterococcus needs to be covered. She has a new what looks like a wrap injury on her lateral left ankle. 6/12; she is using Hydrofera Blue. She has a new area on the left anterior lower tibial area. This was a wrap injury last week. 6/19; the patient is using Hydrofera Blue. She arrived with marked inflammation and erythema around the wound and tenderness. 12/01/18 on evaluation today patient appears to be doing a little bit better based on what I'm hearing from the standpoint of lassos evaluation to this as far as the overall appearance of the wound is concerned. Then sometime substandard she typically sees Dr. Dellia Nims. Nonetheless overall very pleased with the progress that she's made up to this point. No fevers, chills, nausea, or vomiting noted at this time. 7/10; some improvement in the surface area. Aggressively debrided last week apparently. I went ahead with the debridement today although the patient does not tolerate this very well. We have been using Iodoflex. Still a fair amount of drainage 7/17; slightly smaller. Using Iodoflex. 7/24; no change from last week in terms of surface area. We have been using Iodoflex. Surface looks and continues to look somewhat better 7/31; surface area slightly smaller better looking surface. We have been using Iodoflex. This is under Unna boot compression 8/7-Patient presents at 1 week with Unna boot and Iodoflex, wound appears better 8/14-Patient presents at 1 week with Iodoflex, we use the Unna boot, wound appears to be stable  better.Patient is getting Botox treatment for the inversion of the foot for tendon release, Next week 8/21; we are using Iodoflex. Unna boot. The wound is stable in terms of surface area. Under illumination there is some areas of the wound that appear to be either epithelialized or perhaps this is adherent slough at this point I was not really clear. It did not wipe off and I was reluctant to debride this today. 8/28; we are using Iodoflex in an Unna boot. Seems  to be making good improvement. 9/4; using Iodoflex and wound is slightly smaller. 9/18; we are using Iodoflex with topical silver nitrate when she is here. The wound continues to be smaller 10/2; patient missed her appointment last week due to GI issues. She left and Iodoflex based dressing on for 2 weeks. Wound is about the same size about the size of a dime on the left medial lower 10/9 we have been using Iodoflex on the medial left ankle wound. She has a new superficial probable wrap injury on the dorsal left ankle 10/16; we have been using Hydrofera Blue since last week. This is on the left medial ankle 10/23; we have been using Hydrofera Blue since 2 weeks ago. This is on the left medial ankle. Dimensions are better 11/6; using Hydrofera Blue. I think the wound is smaller but still not closed. Left medial ankle 11/13; we have been using Hydrofera Blue. Wound is certainly no smaller this week. Also the surface not as good. This is the remanent of a very large area on her left medial ankle. 11/20; using Sorbact since last week. Wound was about the same in terms of size although I was disappointed about the surface debris 12/11; 3-week follow-up. Patient was on vacation. Wound is measuring slightly larger we have been using Sorbact. 12/18; wound is about the same size however surface looks better last week after debridement. We have been using Sorbact under compression 1/15 wound is probably twice the size of last time increased in length  nonviable surface. We have been using Sorbact. She was running a mild fever and missed her appointment last week 1/22; the wound is come down in size but under illumination still a very adherent debris we have been Hydrofera Blue that I changed her to last week 1/29; dimensions down slightly. We have been using Hydrofera Blue 2/19 dimensions are the same however there is rims of epithelialization under illumination. Therefore more the surface area may be epithelialized 2/26; the patient's wound actually measures smaller. The wound looks healthy. We have been using Hydrofera Blue. I had some thoughts about running Apligraf then I still may do that however this looks so much better this week we will delay that for now 3/5; the wound is small but about the same as last week. We have been using Hydrofera Blue. No debridement is required today. 3/19; the wound is about the size of a dime. Healthy looking wound even under illumination. We have been using Hydrofera Blue. No mechanical debridement is necessary 3/26; not much change from last week although still looks very healthy. We have been using Hydrofera Blue under Unna boots Patient was offered an ankle fusion by podiatry but not until the wound heals with a proceed with this. 4/9; the patient comes in today with her original wound on the medial ankle looking satisfactory however she has some uncontrolled swelling in the middle part of her leg with 2 new open areas superiorly just lateral to the tibia. I think this was probably a wrap issue. She said she felt uncomfortable during the week but did not call in. We have been using Hydrofera Blue 4/16; the wound on the medial ankle is about the same. She has innumerable small areas superior to this across her mid tibia. I think this is probably folliculitis. She is also been working in the yard doing a lot of sweating 4/30; the patient issue on the upper areas across her mid tibia of all healed. I think  this  was excessive yard work if I remember. Her wound on the medial ankle is smaller. Some debris on this we have been using Hydrofera Blue under Unna boots 5/7; mid tibia. She has been using Hydrofera Blue under an Unna wrap. She is apparently going for her ankle surgery on June 3 10/28/19-Patient returns to clinic with the ankle wound, we are using Hydrofera Blue under Unna wrap, surgery is scheduled for her left foot for June 3 so she will be back for nurse visit next week READMISSION 01/17/2020 Mrs. Yacko is a 65 year old woman we have had in this clinic for a long period of time with severe venous hypertension and refractory wounds on her medial lower legs and ankles bilaterally. 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 When she was off her legs for a prolonged period example when she fell and suffered a compression fracture things would heal up quite nicely. She is now retired and we managed to heal up the right medial leg wound. The left one was very tiny last time I saw this although still refractory. 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 It got to the point where this was interfering with her walking and she elected to proceed with a ankle arthrodesis to straighten her her ankle and leave her with a functional outcome for mobilization. The patient was referred to Dr. Doren Custard and really this took some time to arrange. Dr. Doren Custard saw her on 12/07/2019. Once again he verified that she had no arterial issues. She had previously had an angiogram several years ago. Follow-up ABIs on the left showed an ABI of 1.12 with triphasic waveforms and a TBI of 0.92. She is felt to have chronic deep venous insufficiency but I do not think it was felt that anything could be done from about this from  an ablation point of view. At the time Dr. Doren Custard saw this patient the wounds actually look closed via the pictures in his clinic. The patient finally underwent her surgery on 12/15/2019. This went reasonably well and there was a good anatomic outcome. She developed a small distal wound dehiscence on the lateral part of the surgical wound. However more problematically she is developed recurrence of the wound on the medial left ankle. There are actually 2 wounds here one in the distal lower leg and 1 pretty much at the level of the medial malleolus. It is a more distal area that is more problematic. She has been using Hydrofera Blue which started on Friday before this she was simply Ace wrapping. There was a culture done that showed Pseudomonas and she is on ciprofloxacin. A recent CNS on 8/11 was negative. The patient reports some pain but I generally think this is improving. She is using a cam boot completely nonweightbearing using a walker for pivot transfers and a wheelchair 8/24; not much improvement unfortunately she has a surgical wound on the lateral part in the venous insufficiency wound medially. The bottom part of the medial insufficiency wound is still necrotic there is exposed tendon here. We have been using Hydrofera Blue under compression. Her edema control is however better 8/31; patient in for follow-up of his surgical wound on the lateral part of her left leg and chronic venous insufficiency ulcers medially. We put her back in compression  last week. She comes in today with a complaint of 3 or 4 days worth of increasing pain. She felt her cam walker was rubbing on the area on the back of her heel. However there is intense erythema seems more likely she has cellulitis. She had 2 cultures done when she was seeing podiatry in the postop. One of them in late July showed Pseudomonas and she received a course of ciprofloxacin the other was negative on 8/11 she is allergic to penicillin  with anaphylactoid complaints of hives oral swelling via information in epic 9/9; when I saw this patient last week she had intense anterior erythema around her wound on the right lateral heel and ankle and also into the right medial heel. Some of this was no doubt drainage and her walker boot however I was convinced she had cellulitis. I gave her Levaquin and Bactrim she is finishing up on this now. She is following up with Dr. [name] he saw her yesterday. He is taken her out of the walking boot of course she is still nonweightbearing. Her x-ray was negative for any worrisome features such as soft tissue air etc. Things are a lot better this week. She has home health. We have been using Hydrofera Blue under an [employer] which she put back on yesterday. I did not wrap her last week 9/17; her surrounding skin looks a lot better. In fact the area on the left lateral ankle has just a scant amount of eschar. The only remaining wound is the large area on the left medial ankle. Probably about 60% of this is healthy granulation at the surface however she has a significant divot distally. This has adherent debris in it. I been using debridement and silver collagen to try and get this area to fill-in although I do not think we have made much progress this week 9/24; the patient's wound on the left medial ankle looks a lot better. The deeper divot area distally still requires debridement but this is cleaning up quite nicely we have been using silver collagen. The patient is complaining of swelling in her foot and is worried that that is contributing to the nonhealing of the ankle wound. She is also complaining of numbness in her anterior toes 10/4; left medial ankle. The small area distally still has a divot with necrotic material that I have been debriding away. This has an undermining area. She is approved for Apligraf. She saw Dr. [name] her surgeon on 10/1. I think he declared himself is satisfied with the  condition of things. Still nonweightbearing till the end of the month. 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 Her surgical wound is well closed. There is no evidence of infection 10/11; the patient arrived in clinic today with the expectation that we be able to put an Apligraf on this area after debridement however she arrives with a relatively large amount of green drainage on the dressing. The patient states that this started on Friday. She has not been systemically unwell. 10/19; culture I did last week showed both Enterococcus and Pseudomonas. I think this came in separate parts because I stopped her ciprofloxacin I gave her and prescribed her linezolid however now looking at the final culture result this is Pseudomonas which is resistant to quinolones. She has not yet picked up the linezolid apparently phone issues. We are also trying to get a topical antibiotic out of [place] in [place] they can be applied by home health.  She is still having green drainage 10/16; the patient has her topical antibiotic from [facility] in [place]. This is a compounded gel with vancomycin and ciprofloxacin and gentamicin. We are applying this on the wound bed with silver alginate over the top with Unna boot wraps. She arrives in clinic today with a lot less ominous looking drainage although she is only use this topical preparation once the second time today. She sees Dr. [name] her surgeon on Friday she has home health changing the dressing 11/2; still using her compounded topical antibiotic under silver alginate. Surface is cleaning up there is less drainage. We had an Apligraf for her today and I elected to apply it. A light coating of her antibiotic 04/25/2020 upon evaluation today patient appears to be doing well with regard to her ankle ulcer. There is a little bit of slough noted on the surface of the wound I am can have to perform sharp debridement to clear  this away today. With that being said other than that fact overall I feel like she is making progress and we do see some new epithelial growth. There is also some improvement in the depth of the wound and that distal portion. There is little bit of slough there as well. 12/7; 2-week follow-up. Apligraf #3. Dimensions are smaller. Closing in especially inferiorly. Still some surface debris. Still using the [facility] topical antibiotic but I told her that I don't think this needs to be renewed 12/21; 2-week follow-up. Apligraf #4 dimensions are smaller. Nice improvement 06/05/2020; 2-week follow-up. The patient's wound on the left medial ankle looks really excellent. Nice granulation. Advancing epithelialization no undermining no evidence of infection. We would have to reapply for another Apligraf but with the condition of this wound I did not feel strongly about it. We used Hydrofera Blue under the same degree of compression. She follows up with Dr. [name] her surgeon a week Friday 06/13/2020 upon evaluation today patient appears to be doing excellent in regard to her wound. She has been tolerating the dressing changes without complication. Fortunately there is no signs of active infection at this time. No fevers, chills, nausea, vomiting, or diarrhea. She was using Hydrofera Blue last week. 06/20/2020 06/20/2020 on evaluation today patient actually appears to be doing excellent in regard to her wound. This is measuring better and looking much better as well. She has been using the collagen that seems to be doing better for her as well even though the [facility] was and is not sticking or feeling as rough on her wound. She did see Dr. [name] on Friday he is very pleased he also stated none of the hardware has shifted. That is great news 1/27; the patient has a small clean wound all that is remaining. I agree that this is too small to really consider an Apligraf. Under illumination the surface is looking  quite good. We have been using collagen although the dimensions are not any better this week 2/2; the patient has a small clean wound on the left medial ankle. Although this left of her substantial original areas. Measurements are smaller. We have been using polymen Ag under an Haematologist. 2/10; small area on the left medial ankle. This looks clean nothing to debride however dimensions are about the same we have been using polymen I think now for 2 weeks 2/17; not much change in surface area. We have been using polymen Ag without any improvement. 3/17; 1 month follow-up. The patient has been using endoform without  any improvement in fact I think this looks worse with more depth and more expansion 3/24; no improvement. Perhaps less debris on the surface. We have been using Sorbact for 1 week 4/4; wound measures larger. She has edema in her leg and her foot which she tells as her wrap came down. We have been using Unna boots. Sorbact of the wound. She has been approved for Apligraf Electronic Signature(s) Signed: 09/03/2020 4:59:32 PM By: Linton Ham MD Entered By: Linton Ham on 09/03/2020 10:47:22 -------------------------------------------------------------------------------- Physical Exam Details Patient Name: Date of Service: [name], [name]. 09/03/2020 10:00 A M Medical Record Number: [id number] Patient Account Number: [id number] Date of Birth/Sex: Treating RN: 1955-10-02 (65 y.o. [name] Primary Care Provider: [name] Other Clinician: Referring Provider: Treating Provider/Extender: [name] in Treatment: 32 Cardiovascular Pedal pulses are palpable. She has more swelling in the legs and I am used to seeing. Nevertheless there is no warmth. This goes up to the level where I think her wrap would have come down and she seems to agree with me.. Notes Wound exam; fibrinous surface under illumination I removed with a #5 curette today.  There is not an overabundance of this no evidence of surrounding infection Electronic Signature(s) Signed: 09/03/2020 4:59:32 PM By: Linton Ham MD Entered By: Linton Ham on 09/03/2020 10:48:18 -------------------------------------------------------------------------------- Physician Orders Details Patient Name: Date of Service: [name], [name]. 09/03/2020 10:00 A M Medical Record Number: [id number] Patient Account Number: [id number] Date of Birth/Sex: Treating RN: 11-09-1955 (65 y.o. [name] Primary Care Provider: [name] Other Clinician: Referring Provider: Treating Provider/Extender: [name] in Treatment: 48 Verbal / Phone Orders: No Diagnosis Coding ICD-10 Coding Code Description [phone] Chronic venous hypertension (idiopathic) with ulcer and inflammation of left lower extremity L97.828 Non-pressure chronic ulcer of other part of left lower leg with other specified severity L97.328 Non-pressure chronic ulcer of left ankle with other specified severity Follow-up Appointments Return Appointment in 1 week. Bathing/ Shower/ Hygiene May shower with protection but do not get wound dressing(s) wet. Edema Control - Lymphedema / SCD / Other Elevate legs to the level of the heart or above for 30 minutes daily and/or when sitting, a frequency of: - throughout the day Avoid standing for long periods of time. Exercise regularly Compression stocking or Garment 20-30 mm/Hg pressure to: - right leg daily Wound Treatment Wound #15 - Malleolus Wound Laterality: Left, Medial Cleanser: Soap and Water 1 x Per Week Discharge Instructions: May shower and wash wound with dial antibacterial soap and water prior to dressing change. Peri-Wound Care: Sween Lotion (Moisturizing lotion) 1 x Per Week Discharge Instructions: Apply moisturizing lotion as directed Prim Dressing: Cutimed Sorbact Swab ary 1 x Per Week Discharge Instructions: Apply thin  layer of hydrogel over Sorbact Secondary Dressing: Woven Gauze Sponge, Non-Sterile 4x4 in 1 x Per Week Discharge Instructions: Apply over primary dressing as directed. Secondary Dressing: ABD Pad, 5x9 1 x Per Week Discharge Instructions: Apply over primary dressing as directed. Compression Wrap: Unnaboot w/Calamine, 4x10 (in/yd) 1 x Per Week Discharge Instructions: Apply Unnaboot as directed. Electronic Signature(s) Signed: 09/03/2020 4:59:32 PM By: Linton Ham MD Signed: 09/03/2020 5:00:21 PM By: Levan Hurst RN, BSN Entered By: Levan Hurst on 09/03/2020 10:40:17 -------------------------------------------------------------------------------- Problem List Details Patient Name: Date of Service: [name], [name]. 09/03/2020 10:00 A M Medical Record Number: [id number] Patient Account Number: [id number] Date of Birth/Sex: Treating RN: 1956-04-02 (65 y.o. 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 09/03/2020 10:00 A M Medical Record Number: [id number] Patient Account Number: [id number] Date of Birth/Sex: Treating RN: 11-07-1955 (65 y.o. [name] Primary Care Provider: [name] Other Clinician: Referring Provider: Treating Provider/Extender: [name] in Treatment: 32 Subjective History of Present Illness (HPI) the remaining wound is over the left medial ankle. Similar wound over the right medial ankle healed largely with use of Apligraf. Most recently we have been using Hydrofera Blue over this wound with considerable improvement. The patient has been extensively worked up in the past for her venous insufficiency and she is not a candidate for antireflux surgery although I have none of the details available currently. 08/24/14; considerable improvement today. About 50% of this wound areas now epithelialized. The base of the wound appears to be healthier granulation.as opposed to last week when she had deteriorated a considerable improvement 08/17/14; unfortunately the wound has regressed somewhat. The areas of epithelialization from the superior aspect are not nearly as healthy as they were last week. The patient thinks her Hydrofera Blue slipped. 09/07/14; unfortunately the area has markedly regressed in the 2 weeks since I've seen this. There is an odor surrounding erythema. The healthy granulation tissue that we had at the base of the wound now is a dusky color. The nurse reports green drainage 09/14/14; the area looks somewhat better than last week. There is less erythema and less drainage. The culture I did did not show any growth. Nevertheless I think it is better to continue the Cipro and doxycycline for a further week. The remaining wound area was debridement. 09/21/14. Wound did not  require debridement last week. Still less erythema and less drainage. She can complete her antibiotics. The areas of epithelialization in the superior aspect of the wound do not look as healthy as they did some weeks ago 10/05/14 continued improvement in the condition of this wound. There is advancing epithelialization. Less aggressive debridement required 10/19/14 continued improvement in the condition and volume of this wound. Less aggressive debridement to the inferior part of this to remove surface slough and fibrinous eschar 11/02/14 no debridement is required. The surface granulation appears healthy although some of her islands of epithelialization seem to have regressed. No evidence of infection 11/16/14; lites surface debridement done of surface eschar. The wound does not look to be unhealthy. No evidence of infection. Unfortunately the patient has had podiatry issues in the right foot and for some reason has redeveloped small surface ulcerations in the medial right ankle. Her original presentation involved wounds in this area 11/23/14 no debridement. The area on the right ankle has enlarged. The left ankle wound appears stable in terms  of the surface although there is periwound inflammation. There has been regression in the amount of new skin 11/30/14 no debridement. Both wound areas appear healthy. There was no evidence of infection. The the new area on the right medial ankle has enlarged although that both the surfaces appear to be stable. 12/07/14; Debridement of the right medial ankle wound. No no debridement was done on the left. 12/14/14 no major change in and now bilateral medial ankle wounds. Both of these are very painful but the no overt evidence of infection. She has had previous venous ablation 12/21/14; patient states that her right medial ankle wound is considerably more painful last week than usual. Her left is also somewhat painful. She could not tolerate debridement. The right medial  ankle wound has fibrinous surface eschar 12/28/14 this is a patient with severe bilateral venous insufficiency ulcers. For a considerable period of time we actually had the one on the right medial ankle healed however this recently opened up again in June. The left medial ankle wound has been a refractory area with some absent flows. We had some success with Hydrofera Blue on this area and it literally closed by 50% however it is recently opened up Foley. Both of these were debridement today of surface eschar. She tolerates this poorly 01/25/15: No change in the status of this. Thick adherent escar. Very poor tolerance of any attempt at debridement. I had healed the right medial malleolus wound for a considerable amount of time and had the left one down to about 50% of the volume although this is totally regressed over the last 48 weeks. Further the right leg has reopened. she is trying to make a appointment with pain and vascular, previous ablations with Dr. Aleda Grana. I do not believe there is an arterial insufficiency issue here 02/01/15 the status of the adherent eschar bilaterally is actually improved. No debridement was done. She did not manage to get vascular studies done 02/08/15 continued debridement of the area was done today. The slough is less adherent and comes off with less pressure. There is no surrounding infection peripheral pulses are intact 02/15/15 selective debridement with a disposable curette. Again the slough is less adherent and comes off with less difficulty. No surrounding infection peripheral pulses are intact. 02/22/15 selective debridement of the right medial ankle wound. Slough comes off with less difficulty. No obvious surrounding infection peripheral pulses are intact I did not debridement the one on the left. Both of these are stable to improved 03/01/15 selective debridement of both wound areas using a curette to. Adherent slough cup soft with less difficulty. No obvious  surrounding infection. The patient tells me that 2 days ago she noted a rash above the right leg wrap. She did not have this on her lower legs when she change this over she arrives with widespread left greater than right almost folliculitis-looking rash which is extremely pruritic. I don't see anything to culture here. There is no rash on the rest of her body. She feels well systemically. 03/08/15; selective debridement of both wounds using a curette. Base of this does not look unhealthy. She had limegreen drainage coming out of the left leg wound and describes a lot of drainage. The rash on her left leg looks improved to. No cultures were done. 03/22/15; patient was not here last week. Basal wounds does not look healthy and there is no surrounding erythema. No drainage. There is still a rash on the left leg that almost looks vasculitic however it  is clearly limited to the top of where the wrap would be. 04/05/15; on the right required a surgical debridement of surface eschar and necrotic subcutaneous tissue. I did not debridement the area on the left. These continue to be large open wounds that are not changing that much. We were successful at one point in healing the area on the right, and at the same time the area on the left was roughly half the size of current measurements. I think a lot of the deterioration has to do with the prolonged time the patient is on her feet at work 04/19/15 I attempted-like surface debridement bilaterally she does not tolerate this. She tells me that she was in allergic care yesterday with extreme pain over her left lateral malleolus/ankle and was told that she has an "sprain" 05/03/15; large bilateral venous insufficiency wounds over the medial malleolus/medial aspect of her ankles. She complains of copious amounts of drainage and his usual large amounts of pain. There is some increasing erythema around the wound on the right extending into the medial aspect of her foot  to. historically she came in with these wounds the right one healed and the left one came down to roughly half its current size however the right one is reopened and the left is expanded. 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 05/10/15 large bilateral venous insufficiency wounds. There is less adherence surface left however the surface culture that I did last week grew pseudomonas therefore bilateral selective debridement score necessary. There is surrounding erythema. The patient describes severe bilateral drainage and a lot of pain in the left ankle. Apparently her podiatrist was were ready to do a cortisone shot 05/17/15; the patient complains of pain and again copious amounts of drainage. 05/24/15; we used Iodo flex last week. Patient notes considerable improvement in wound drainage. Only needed to change this once. 05/31/15; we continued Iodoflex; the base of these large wounds bilaterally is not too bad but there is probably likely a significant bioburden here. I would like to debridement just doesn't tolerate it. 06/06/14 I would like to continue the Iodoflex although she still hasn't managed to obtain supplies. She has bilateral medial malleoli or large wounds which are mostly superficial. Both of them are covered circumferentially with some nonviable fibrinous slough although she tolerates debridement very poorly. She apparently has an appointment for an ablation on the right leg by interventional radiology. 06/14/15; the patient arrives with the wounds and static condition. We attempted a debridement although she does not do well with this secondary to pain. I 07/05/15; wounds are not much smaller however there appears to be a cleaner granulating base. The left has tight fibrinous slough greater than the right. Debridement is tolerated poorly due to pain. Iodoflex is done more for these wounds in any of the multitude of different dressings I  have tried on the left 1 and then subsequently the right. 07/12/15; no change in the condition of this wound. I am able to do an aggressive debridement on the right but not the left. She simply cannot tolerate it. We have been using Iodoflex which helps somewhat. It is worthwhile remembering that at one point we healed the right medial ankle wound and the left was about 25% of the current circumference. We have suggested returning to vascular surgery for review of possible further ablations for one reason or another she has not been able to do  this. 07/26/15 no major change in the condition of either wound on her medial ankle. I did not attempt to debridement of these. She has been aggressively scrubbing these while she is in the shower at home. She has her supply of Iodoflex which seems to have done more for these wounds then anything I have put on recently. 08/09/15 wound area appears larger although not verified by measurements. Using Iodoflex 09/05/2015 -- she was here for avisit today but had significant problems with the wound and I was asked to see her for a physician opinion. I have summarize that this lady has had surgery on her left lower extremity about 10 years ago where the possible veins stripping was done. She has had an opinion from interventional radiology around November 2016 where no further sclerotherapy was ordered. The patient works 12 hours a day and stands on a concrete floor with work boots and is unable to get the proper compression she requires and cannot elevate her limbs appropriately at any given time. She has recently grown Pseudomonas from her wound culture but has not started her ciprofloxacin which was called in for her. 09/13/15 this continues to be a difficult situation for this patient. At one point I had this wound down to a 1.5 x 1.5" wound on her left leg. This is deteriorated and the right leg has reopened. She now has substantial wounds on her medial calcaneus,  malleoli and into her lower leg. One on the left has surface eschar but these are far too painful for me to debridement here. She has a vascular surgery appointment next week to see if anything can be done to help here. I think she has had previous ablations several years ago at [facility]. She has no major edema. She tells me that she did not get product last time [facility] and went for several days without it. She continues to work in work boots 12 hours a day. She cannot get compression/4-layer under her work boots. 09/20/15 no major change. Periwound edema control was not very good. Her point with pain and vascular is next Wednesday the 25th 09/28/15; the patient is seen vascular surgery and is apparently scheduled for repeat duplex ultrasounds of her bilateral lower legs next week. 10/05/15; the patient was seen by Dr. Doren Custard of vascular surgery. He feels that she should have arterial insufficiency excluded as cause/contributed to her nonhealing stage she is therefore booked for an arteriogram. She has apparently monophasic signals in the dorsalis pedis pulses. She also of course has known severe chronic venous insufficiency with previous procedures as noted previously. I had another long discussion with the patient today about her continuing to work 12 hour shifts. I've written her out for 2 months area had concerns about this as her work location is currently undergoing significant turmoil and this may lead to her termination. She is aware of this however I agree with her that she simply cannot continue to stand for 12 hours multiple days a week with the substantial wound areas she has. 10/19/15; the Dr. Doren Custard appointment was largely for an arteriogram which was normal. She does not have an arterial issue. He didn't make a comment about her chronic venous insufficiency for which she has had previous ablations. Presumably it was not felt that anything additional could be done. The patient is  now out of work as I prescribed 2 weeks ago. Her wounds look somewhat less aggravated presumably because of this. I felt I would give debridement  another try today 10/25/15; no major change in this patient's wounds. We are struggling to get her product that she can afford into her own home through her insurance. 11/01/15; no major change in the patient's wounds. I have been using silver alginate as the most affordable product. I spoke to Dr. Marla Roe last week with her requested take her to the OR for surgical debridement and placement of ACEL. Dr. Marla Roe told me that she would be willing to do this however [facility] will not cover this, fortunately the patient has [place] healthcare of some variant 11/08/15; no major change in the patient's wounds. She has been completely nonviable surface that this but is in too much pain with any attempted debridement are clinic. I have arranged for her to see Dr. Marla Roe ham of plastic surgery and this appointment is on Monday. I am hopeful that they will take her to the OR for debridement, possible ACEL ultimately possible skin graft 11/22/15 no major change in the patient's wounds over her bilateral medial calcaneus medial malleolus into the lower legs. Surface on these does not look too bad however on the left there is surrounding erythema and tenderness. This may be cellulitis or could him sleepy tinea. 11/29/15; no major changes in the patient's wounds over her bilateral medial malleolus. There is no infection here and I don't think any additional antibiotics are necessary. There is now plan to move forward. She sees Dr. Marla Roe in a week's time for preparation for operative debridement and ACEL placement I believe on 7/12. She then has a follow-up appointment with Dr. Marla Roe on 7/21 12/28/15; the patient returns today having been taken to the [facility] by Dr. Marla Roe 12/12/15 she underwent debridement, intraoperative cultures [which  were negative]. She had placement of a wound VAC. Parent really ACEL was not available to be placed. The wound VAC foam apparently adhered to the wound since then she's been using silver alginate, Xeroform under Ace wraps. She still says there is a lot of drainage and a lot of pain 01/31/16; this is a patient I see monthly. I had referred her to Dr. Marla Roe him of plastic surgery for large wounds on her bilateral medial ankles. She has been to the OR twice once in early July and once in early August. She tells me over the last 3 weeks she has been using the wound VAC with ACEL underneath it. On the right we've simply been using silver alginate. Under Kerlix Coban wraps. 02/28/16; this is a patient I'm currently seeing monthly. She is gone on to have a skin graft over her large venous insufficiency ulcer on the left medial ankle. This was done by Dr. Marla Roe him. The patient is a bit perturbed about why she didn't have one on her right medial ankle wound. She has been using silver alginate to this. 03/06/16; I received a phone call from her plastic surgery Dr. Marla Roe. She expressed some concern about the viability of the skin graft she did on the left medial ankle wound. Asked me to place Endoform on this. She told me she is not planning to do a subsequent skin graft on the right as the left one did not take very well. I had placed Hydrofera Blue on the right 03/13/16; continue to have a reasonably healthy wound on the right medial ankle. Down to 3 mm in terms of size. There is epithelialization here. The area on the left medial ankle is her skin graft site. I suppose the last  week this looks somewhat better. She has an open area inferiorly however in the center there appears to be some viable tissue. There is a lot of surface callus and eschar that will eventually need to come off however none of this looked to be infected. Patient states that the is able to keep the dressing on for several  days which is an improvement. 03/20/16 no major change in the circumference of either wound however on the left side the patient was at Dr. Eusebio Friendly office and they did a debridement of left wound. 50% of the wound seems to be epithelialized. I been using Endoform on the left Hydrofera Blue in the right 03/27/16; she arrives today with her wound is not looking as healthy as they did last week. The area on the right clearly has an adherent surface to this a very similar surface on the left. Unfortunately for this patient this is all too familiar problem. Clearly the Endoform is not working and will need to change that today that has some potential to help this surface. She does not tolerate debridement in this clinic very well. She is changing the dressing wants 04/03/16; patient arrives with the wounds looking somewhat better especially on the right. Dr. Migdalia Dk change the dressing to silver alginate when she saw her on Monday and also sold her some compression socks. The usefulness of the latter is really not clear and woman with severely draining wounds. 04/10/16; the patient is doing a bit of an experiment wearing the compression stockings that Dr. Migdalia Dk provided her to her left leg and the out of legs based dressings that we provided to the right. 05/01/16; the patient is continuing to wear compression stockings Dr. Migdalia Dk provided her on the left that are apparently silver impregnated. She has been using Iodoflex to the right leg wound. Still a moderate amount of drainage, when she leaves here the wraps only last for 4 days. She has to change the stocking on the left leg every night 05/15/16; she is now using compression stockings bilaterally provided by Dr. Marla Roe. She is wearing a nonadherent layer over the wounds so really I don't think there is anything specific being done to this now. She has some reduction on the left wound. The right is stable. I think all healing here is being  done without a specific dressing 06/09/16; patient arrives here today with not much change in the wound certainly in diameter to large circular wounds over the medial aspect of her ankle bilaterally. Under the light of these services are certainly not viable for healing. There is no evidence of surrounding infection. She is wearing compression stockings with some sort of silver impregnation as prescribed by Dr. Marla Roe. She has a follow-up with her tomorrow. 06/30/16; no major change in the size or condition of her wounds. These are still probably covered with a nonviable surface. She is using only her purchase stockings. She did see Dr. Marla Roe who seemed to want to apply Dakin's solution to this I'm not extreme short what value this would be. I would suggest Iodoflex which she still has at home. 07/28/16; I follow Mrs. Jeong episodically along with Dr. Marla Roe. 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 She has been applying a topical collagen ointment to both wounds with Adaptic. I don't think Dr. Marla Roe is planning to take her back to the OR. 08/19/16; I follow Mrs. Jeneen Rinks on a monthly basis along with Dr. Marla Roe  of plastic surgery. 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 I been following her for a number of years. At one point I was able to get the right medial malleolus wound to heal and had the left medial malleolus down to about half its current size however and I had to send her to plastic surgery for an operative debridement. Since then things have been stable to slightly improve the area on the right is slightly better one in the left about the same although there is much less adherent surface than I'm used to with this patient. She is using some form of liquid collagen gel that Dr. Marla Roe provided a Kerlix cover with the patient's  own pressure stockings. She tells me that she has extreme pain in both ankles and along the lateral aspect of both feet. She has been unable to work for some period of time. She is telling me she is retiring at the beginning of April. She sees Dr. Doran Durand of orthopedics next week 09/22/16; patient has not seen Dr. Marla Roe since the last time she is here. I'm not really sure what she is using to the wounds other than bits and pieces of think she had left over including most recently Hydrofera Blue. She is using juxtalite stockings. She is having difficulty with her husband's recent illness "stroke". She is having to transport him to various doctors appointments. Dr. Marla Roe left her the option of a repeat debridement with ACEL however she has not been able to get the time to follow-up on this. She continues to have a fair amount of drainage out of these wounds with certainly precludes leaving dressings on all week 10/13/16; patient has not seen Dr. Marla Roe since she was last in our clinic. I'm not really sure what she is doing with the wounds, we did try to get her [facility] and I think she is actually using this most of the time. Because of drainage she states she has to change this every second day although this is an improvement from what she used to do. She went to see Dr. Doran Durand who did not think she had a muscular issue with regards to her feet, he referred her to a neurologist and I think the appointment is sometime in June. I changed her back to Iodoflex which she has used in the past but not recently. 11/03/16; the patient has been using Iodoflex although she ran out of this. Still claims that there is a lot of drainage although the wound does not look like this. No surrounding erythema. She has not been back to see Dr. Marla Roe 11/24/16; the patient has been using Iodoflex again but she ran out of it 2 or 3 days ago. There is no major change in the condition of either one of  these wounds in fact they are larger and covered in a thick adherent surface slough/nonviable tissue especially on the left. She does not tolerate mechanical debridement in our clinic. Going back to see Dr. Marla Roe of plastic surgery for an operative debridement would seem reasonable. 12/15/16; the patient has not been back to see Dr. Marla Roe. She is been dealing with a series of illnesses and her husband which of monopolized her time. She is been using Sorbact which we largely supplied. She states the drainage is bad enough that it maximum she can go 2-3 days without changing the dressing 01/12/2017 -- the patient has not been back for about  4 weeks and has not seen Dr. Marla Roe not does she have any appointment pending. 01/23/17; patient has not seen Dr. Marla Roe even though I suggested this previously. She is using Santyl that was suggested last week by Dr. Con Memos this Cost her $16 through her insurance which is indeed surprising 02/12/17; continuing Santyl and the patient is changing this daily. A lot of drainage. She has not been back to see plastic surgery she is using an Ace wrap. Our intake nurse suggested wrap around stockings which would make a good reasonable alternative 02/26/17; patient is been using Santyl and changing this daily due to drainage. She has not been to see plastic surgery she uses in April Ace wrap to control the edema. She did obtain extremitease stockings but stated that the edema in her leg was to big for these 03/20/17; patient is using Santyl and Anasept. Surfaces looked better today the area on the right is actually measuring a little smaller. She has states she has a lot of pain in her feet and ankles and is asking for a consult to pain control which I'll try to help her with through our case manager. 04/10/17; the patient arrives with better-looking wound surfaces and is slightly smaller wound on the left she is using a combination of Santyl and Anasept.  She has an appointment or at least as started in the pain control center associated with [facility] 05/14/17; this is a patient who I followed for a prolonged period of time. 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 At one point I had this down to a much smaller wound on the left however these reopened and we've never been able to get these to heal. She has been using Santyl and Anasept gel although 2 weeks ago she ran out of the Anasept gel. She has a stable appearance of the wound. She is going to the wound care clinic at [facility]. They wanted do a nerve block/spinal block although she tells me she is reluctant to go forward with that. 05/21/17; this is a patient I have followed for many years. 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 Chronic pain and deformity in her ankles as well. She is been to see plastic surgery as well as orthopedics. Using PolyMem AG most recently/Kerramax/ABDs and 2 layer compression. She has managed to keep this on and she is coming in for a nurse check to change the dressing on Tuesdays, we see her on Fridays 06/05/17; really quite a good looking surface and the area especially on the right medial has contracted in terms of dimensions. Well granulated healthy-looking tissue on both sides. Even with an open curet there is nothing that even feels abnormal here. This is as good as I've seen this in quite some time. We have been using PolyMem AG and bringing her in for a nurse check 06/12/17; really quite good surface on both of these wounds. The right medial has contracted a bit left is not. We've been using PolyMem and AG and she is coming in for a nurse visit 06/19/17; we have been using PolyMem AG and bringing her in for a nurse check. Dimensions of her wounds are not better but the surfaces looked better bilaterally. She complained of bleeding last night and the left wound and increasing pain bilaterally.  She states her wound pain is more neuropathic than just the wounds. There was some suggestion that this was radicular from her pain management doctor in  talking to her it is really difficult to sort this out. 06/26/17; using PolyMem and AG and bringing her in for a nurse check as All of this and reasonably stable condition. Certainly not improved. The dimensions on the lateral part of the right leg look better but not really measuring better. The medial aspect on the left is about the same. 07/03/16; we have been using PolyMen AG and bringing her in for a nurse check to change the dressings as the wounds have drainage which precludes once weekly changing. We are using all secondary absorptive dressings.our intake nurse is brought up the idea of using a wound VAC/snap VAC on the wound to help with the drainage to see if this would result in some contraction. This is not a bad idea. The area on the right medial is actually looking smaller. Both wounds have a reasonable-looking surface. There is no evidence of cellulitis. The edema is well controlled 07/10/17; the patient was denied for a snap VAC by her insurance. The major issue with these wounds continues to be drainage. We are using wicked PolyMem AG and she is coming in for a nurse visit to change this. The wounds are stable to slightly improved. The surface looks vibrant and the area on the right certainly has shrunk in size but very slowly 07/17/17; the patient still has large wounds on her bilateral medial malleoli. Surface of both of these wounds looks better. The dimensions seem to come and go but no consistent improvement. There is no epithelialization. We do not have options for advanced treatment products due to insurance issues. They did not approve of the wound VAC to help control the drainage. More recently we've been using PolyMem and AG wicked to allow drainage through. We have been bringing her in for a nurse visit to change this. We do not  have a lot of options for wound care products and the home again due to insurance issues 07/24/17; the patient's wound actually looks somewhat better today. No drainage measurements are smaller still healthy-looking surface. We used silver collagen under PolyMen started last week. We have been bringing her in for a dressing change 07/31/17; patient's wound surface continued to look better and I think there is visible change in the dimensions of the wound on the right. Rims of epithelialization. We have been using silver collagen under PolyMen and bringing her in for a dressing change. There appears to be less drainage although she is still in need of the dressing change 08/07/17. Patient's wound surface continues to look better on both sides and the area on the right is definitely smaller. We have been using silver collagen and PolyMen. She feels that the drainage has been it has been better. I asked her about her vascular status. She went to see Dr. Aleda Grana at [facility] and had some form of ablation. I don't have much detail on this. I haven't my notes from 2016 that she was not a candidate for any further ablation but I don't have any more information on this. We had referred her to vein and vascular I don't think she ever went. He does not have a history of PAD although I don't have any information on this either. We don't even have ABIs in our record 08/14/17; we've been using silver collagen and PolyMen cover. And putting the patient and compression. She we are bringing her in as a nurse visit to change this because ofarge amount of drainage. We didn't the ABIs in clinic  today since they had been done in many moons 1.2 bilaterally. She has been to see vein and vascular however this was at [facility] and she had ablation although I really don't have any information on this all seemed biking get a report. She is also been operatively debrided by plastic surgery and had a cell placed probably  8-12 months ago. This didn't have a major effect. We've been making some gains with current dressings 08/19/17-She is here in follow-up evaluation for bilateral medial malleoli ulcers. She continues to tolerate debridement very poorly. We will continue with recently changed topical treatment; if no significant improvement may consider switching to Iodosorb/Iodoflex. 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 She has been using silver collagen and PolyMem. I believe she has been used and tried on Iodoflex before. 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 These have been very refractory over time. We have been using silver collagen and PolyMen. She is coming in once a week for a doctor's and once a week for nurse visits. We are actually making some progress 09/18/17; the patient's wounds are smaller especially on the right medial. She arrives today to upset to consider even washing these off with Anasept which I think is been part of the reason this is been closing. We've been using collagen covered in PolyMen otherwise. It is noted that she has a small area of folliculitis on the right medial calf that. As we are wrapping her legs I'll give her a short course of doxycycline to make sure this doesn't amount to anything. She is a long list of complaints today including imbalance, shortness of breath on exertion, inversion of her left ankle. With regards to the latter complaints she is been to see orthopedics and they offered her a tendon release surgery I believe but wanted her wounds to be closed first. I have recommended she go see her primary physician with regards to everything else. 09/25/17; patient's wounds are about the same size. We have made some progress  bilaterally although not in recent weeks. She will not allow me T wash these wounds with Anasept even if she is doing her cell. Wheeze we've been using collagen covered in PolyMen. Last week she had a small area of folliculitis this is now opened into a small wound. She completed 5 days of trimethoprim sulfamethoxazole 10/02/17; unfortunately the area on her left medial ankle is worse with a larger wound area towards the Achilles. The patient complains of a lot of pain. She will not allow debridement although visually I don't think there is anything to debridement in any case. We have been using silver collagen and PolyMen for several months now. Initially we are making some progress although I'm not really seeing that today. We will move back to [facility]. His admittedly this is a bit of a repeat however I'm hoping that his situation is different now. The patient tells me she had her leg on the left give out on her yesterday this is process some pain. 10/09/17; the patient is seen twice a week largely because of drainage issues coming out of the chronic medial bimalleolar wounds that are chronic. Last week the dimensions of the one on the left looks a little larger I changed her to [facility]. She  comes in today with a history of terrible pain in the bilateral wound areas. She will not allow debridement. She will not even allow a tissue culture. There is no surrounding erythema no no evidence of cellulitis. We have been putting her Kerlix Coban man. She will not allow more aggressive compression as there was a suggestion to put her in 3 layer wraps. 10/16/17; large wounds on her bilateral medial malleoli. These are chronic. Not much change from last week. The surface looks have healthy but absolutely no epithelialization. A lot of pain little less so of drainage. She will not allow debridement or even washing these off in the vigorous fashion with Anasept. 10/23/17; large wounds on her bilateral  malleoli which are chronic. Some improvement in terms of size perhaps on the right since last time I saw these. She states that after we increased the 3 layer compression there was some bleeding, when she came in for a nurse visit she did not want 3 layer compression put back on about our nurse managed to convince her. She has known chronic venous visit issues and I'm hoping to get her to tolerate the 3 layer compression. using Hydrofera Blue 10/30/17; absolutely no change in the condition of either wound although we've had some improvement in dimensions on the right.. Attempted to put her in 3 layer compression she didn't tolerated she is back in 2 layer compression. We've been using Hydrofera Blue We looked over her past records. She had venous reflux studies in November 2016. There was no evidence of deep venous reflux on the right. Superficial vein did not show the greater saphenous vein at think this is been previously ablated the small saphenous vein was within normal limits. The left deep venous system showed no DVT the vessels were positive for deep venous reflux in the posterior tibial veins at the ankle. The greater saphenous vein was surgically absent small saphenous vein was within normal limits. She went to vein and vascular at [facility]. I believe she had an ablation on the left greater saphenous vein. I'll update her reflux studies perhaps ever reviewed by vein and vascular. We've made absolutely no progress in these wounds. Will also try to read and TheraSkins through her insurance 11/06/17; W the patient apparently has a 2 week follow-up with vein and vascular I like him to review the whole issue with regards to her previous vascular workup by Dr. Aleda Grana. We've really made no progress on these wounds in many months. She arrives today with less viable looking surface on the left medial ankle wound. This was apparently looking about the same on Tuesday when she was here for nurse  visit. 11/13/17; deep tissue culture I did last time of the left lower leg showed multiple organisms without any predominating. In particular no Staphylococcus or group A strep were isolated. We sent her for venous reflux studies. She's had a previous left greater saphenous vein stripping and I think sclerotherapy of the right greater saphenous vein. She didn't really look at the lesser saphenous vein this both wounds are on the medial aspect. She has reflux in the common femoral vein and popliteal vein and an accessory vein on the right and the common femoral vein and popliteal vein on the left. I'm going to have her go to see vein and vascular just the look over things and see if anything else beside aggressive compression is indicated here. We have not been able to make any progress on these wounds in spite  of the fact that the surface of the wounds is never look too bad. 11/20/17; no major change in the condition of the wounds. Patient reports a large amount of drainage. She has a lot of complaints of pain although enlisting her today I wonder if some of this at least his neuropathic rather than secondary to her wounds. She has an appointment with vein and vascular on 12/30/17. The refractory nature of these wounds in my mind at least need vein and vascular to look over the wounds the recent reflux studies we did and her history to see if anything further can be done here. I also note her gait is deteriorated quite a bit. 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 She has a bilateral Trendelenburg gait. I wonder if this is neuropathic or perhaps multilevel radicular. 11/27/17; her wounds actually looks slightly better. Healthy-looking granulation tissue a scant amount of epithelialization. [place] healthcare will not pay for [employer]. They will play for tri layer Oasis and Dermagraft. This is not a diabetic ulcer. We'll try for the tri layer Oasis. She still complains of some drainage. She  has a vein and vascular appointment on 12/30/17 12/04/17; the wounds visually look quite good. Healthy-looking granulation with some degree of epithelialization. We are still waiting for response to our request for trial to try layer Oasis. Her appointment with vascular to review venous and arterial issues isn't sold the end of July 7/31. Not allow debridement or even vigorous cleansing of the wound surface. 12/18/17; slightly smaller especially on the right. 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 We've been using Hydrofera Blue. We still are looking into triple layer Oasis through her insurance 01/08/18 on evaluation today patient's wound actually appears to be showing signs of good improvement at this point in time. She has been tolerating the dressing changes without complication. Fortunately there does not appear to be any evidence of infection at this point in time. We have been utilizing silver nitrate which does seem to be of benefit for her which is also good news. Overall I'm very happy with how things seem to be both regards appearance as well as measurement. Patient did see Dr. Bridgett Larsson for evaluation on 12/30/17. In his assessment he felt that stripping would not likely add much more than chronic compression to the patient's healing process. His recommendation was to follow-up in three months with Dr. Doren Custard if she hasn't healed in order to consider referral back to you and see vascular where she previously was in a trial and was able to get her wound to heal. I'll be see what she feels she when you staying compression and he reiterated this as well. 01/13/18 on evaluation today patient appears to actually be doing very well in regard to her bilateral medial malleolus ulcers. She seems to have tolerated the chemical cauterization with silver nitrate last week she did have some pain through that evening but fortunately states that I'll be see since it seems to be doing  better she is overall pleased with the progress. 01/21/18; really quite a remarkable improvement since I've last seen these wounds. We started using silver nitrate specially on the islands of hyper granulation which for some reason her around the wound circumference. This is really done quite nicely. Primary dressing Hydrofera Blue under 4 layer compression. She seems to be able to hold out without a nurse rewrap. Follow-up in 1 week 01/28/18; we've continued the hydrofera blue but continued with chemical cauterization  to the wound area that we started about a month ago for irregular hyper granulation. She is made almost stunning improvement in the overall wound dimensions. I was not really expecting this degree of improvement in these chronic wounds 02/05/18; we continue with Hydrofera Bluebut of also continued the aggressive chemical cauterization with silver nitrate. We made nice progress with the right greater than left wound. 02/12/18. We continued with Hydrofera Blue after aggressive chemical cauterization with silver nitrate. We appear to be making nice progress with both wound areas 02/19/2018; we continue with [facility] after washing the wounds vigorously with Anasept spray and chemical cauterization with silver nitrate. We are making excellent progress. The area on the right's just about closed 02/26/2018. The area on the left medial ankle had too much necrotic debris today. I used a #5 curette we are able to get most of the soft. I continued with the silver nitrate to the much smaller wound on the right medial ankle she had a new area on her right lower pretibial area which she says was due to a role in her compression 03/05/2018; both wound areas look healthy. Not much change in dimensions from last week. I continue to use silver nitrate and Hydrofera Blue. The patient saw Dr. Doren Custard of vein and vascular. He felt she had venous stasis ulcers. He felt based on her previous arteriogram she  should have adequate circulation for healing. Also she has deep venous reflux but really no significant correctable superficial venous reflux at this time. He felt we should continue with conservative management including leg elevation and compression 04/02/2018; since we last saw this woman about a month ago she had a fall apparently suffered a pelvic fracture. I did not look up the x-ray. Nevertheless because of pain she literally was bedbound for 2 weeks and had home health coming out to change the dressing. Somewhat predictably this is resulted in considerable improvement in both wound areas. The right is just about closed on the medial malleolus and the left is about half the size. 04/16/2018; both her wounds continue to go down in size. Using Hydrofera Blue. 05/07/18; both her wounds appeared to be improving especially on the right where it is almost closed. We are using Hydrofera Blue 05/14/2018; slightly worse this week with larger wounds. Surface on the left medial not quite as good. We have been using Hydrofera Blue 05/21/18; again the wounds are slightly larger. Left medial malleolus slightly larger with eschar around the circumference. We have been using Hydrofera Blue undergoing a wraps for a prolonged period of time. This got a lot better when she was more recumbent due to a fall and a back injury. I change the primary dressing the silver alginate today. She did not tolerate a 4 layer compression previously although I may need to bring this up with her next time 05/28/2018; area on the left medial malleolus again is slightly larger with more drainage. Area on the right is roughly unchanged. She has a small area of folliculitis on the right medial just on the lower calf. This does not look ominous. 06/03/2018 left medial malleolus slightly smaller in a better looking surface. We used silver nitrate on this last time with silver alginate. The area on the right appears slightly smaller 1/10;  left medial malleolus slightly smaller. Small open area on the right. We used silver nitrate and silver alginate as of 2 weeks ago. We continue with the wound and compression. These got a lot better when she  was off her feet 1/17; right medial malleolus wound is smaller. The left may be slightly smaller. Both surfaces look somewhat better. 1/24; both wounds are slightly smaller. Using silver alginate under Unna boots 1/31; both wounds appear smaller in fact the area on the right medial is just about closed. Surface eschar. We have been using silver alginate under Unna boots. The patient is less active now spends let much less time on her feet and I think this is contributed to the general improvement in the wound condition 2/7; both wounds appear smaller. I was hopeful the right medial would be closed however there there is still the same small open area. Slight amount of surface eschar on the left the dimensions are smaller there is eschar but the wound edges appear to be free. We have been using silver alginate under Unna boot's 2/14; both wounds once again measure smaller. Circumferential eschar on the left medial. We have been using silver alginate under Unna boots with gradual improvement 2/21; the area on the right medial malleolus has healed. The area on the left is smaller. We have been using silver alginate and Unna boots. We can discharge wrapping the right leg she has 20/30 stockings at home she will need to protect the scar tissue in this area 2/28; the area on the right medial malleolus remains closed the patient has a compression stocking. The area on the left is smaller. We have been using silver alginate and Unna boots. 3/6 the area on the right medial ankle remains closed. Good edema control noted she is using her own compression stocking. The area on the left medial ankle is smaller. We have been managing this with silver alginate and Unna boots which we will continue today. 3/13;  the area on the right medial ankle remains closed and I'm declaring it healed today. When necessary the left is about the same still a healthy-looking surface but no major change and wound area. No evidence of infection and using silver alginate under unna and generally making considerable improvement 3/27 the area on the right medial ankle remains closed the area on the left is about the same as last week. Certainly not any worse we have been using silver alginate under an Unna boot 4/3; the area on the right medial ankle remains closed per the patient. We did not look at this wound. The wound on the left medial ankle is about the same surface looks healthy we have been using silver alginate under an Unna boot 4/10; area on the right medial ankle remains closed per the patient. We did not look at this wound. The wound on the left medial ankle is slightly larger. The patient complains that the [facility] caused burning pain all week. She also told us that she was a lot more active this week. Changed her back to silver alginate 4/17; right medial ankle still closed per the patient. Left medial ankle is slightly larger. Using silver alginate. She did not tolerate Hydrofera Blue on this area 4/24; right medial ankle remains closed we have not look at this. The left medial ankle continues to get larger today by about a centimeter. We have been using silver alginate under Unna boots. She complains about 4 layer compression as an alternative. She has been up on her feet working on her garden 5/8; right medial ankle remains closed we did not look at this. The left medial ankle has increased in size about 100%. We have been using silver alginate under  Unna boots. She noted increased pain this week and was not surprised that the wound is deteriorated 5/15; no major change in SA however much less erythema ( one week of doxy ocellulitis). 5/22-65 year old female returns at 1 week to the clinic for left  medial ankle wound for which we have been using silver alginate under 3 layer compression She was placed on DOXY at last visit - the wound is wider at this visit. She is in 3 layer compression 5/29; change to [facility] last week. I had given her empiric doxycycline 2 weeks ago for a week. She is in 3 layer compression. She complains of a lot of pain and drainage on presentation today. 6/5; using Hydrofera Blue. I gave her doxycycline recently empirically for erythema and pain around the wound. Believe her cultures showed enterococcus which not would not have been well covered by doxycycline nevertheless the wound looks better and I don't feel specifically that the enterococcus needs to be covered. She has a new what looks like a wrap injury on her lateral left ankle. 6/12; she is using Hydrofera Blue. She has a new area on the left anterior lower tibial area. This was a wrap injury last week. 6/19; the patient is using Hydrofera Blue. She arrived with marked inflammation and erythema around the wound and tenderness. 12/01/18 on evaluation today patient appears to be doing a little bit better based on what I'm hearing from the standpoint of lassos evaluation to this as far as the overall appearance of the wound is concerned. Then sometime substandard she typically sees Dr. Dellia Nims. Nonetheless overall very pleased with the progress that she's made up to this point. No fevers, chills, nausea, or vomiting noted at this time. 7/10; some improvement in the surface area. Aggressively debrided last week apparently. I went ahead with the debridement today although the patient does not tolerate this very well. We have been using Iodoflex. Still a fair amount of drainage 7/17; slightly smaller. Using Iodoflex. 7/24; no change from last week in terms of surface area. We have been using Iodoflex. Surface looks and continues to look somewhat better 7/31; surface area slightly smaller better looking surface. We  have been using Iodoflex. This is under Unna boot compression 8/7-Patient presents at 1 week with Unna boot and Iodoflex, wound appears better 8/14-Patient presents at 1 week with Iodoflex, we use the Unna boot, wound appears to be stable better.Patient is getting Botox treatment for the inversion of the foot for tendon release, Next week 8/21; we are using Iodoflex. Unna boot. The wound is stable in terms of surface area. Under illumination there is some areas of the wound that appear to be either epithelialized or perhaps this is adherent slough at this point I was not really clear. It did not wipe off and I was reluctant to debride this today. 8/28; we are using Iodoflex in an Unna boot. Seems to be making good improvement. 9/4; using Iodoflex and wound is slightly smaller. 9/18; we are using Iodoflex with topical silver nitrate when she is here. The wound continues to be smaller 10/2; patient missed her appointment last week due to GI issues. She left and Iodoflex based dressing on for 2 weeks. Wound is about the same size about the size of a dime on the left medial lower 10/9 we have been using Iodoflex on the medial left ankle wound. She has a new superficial probable wrap injury on the dorsal left ankle 10/16; we have been using Hydrofera Blue  since last week. This is on the left medial ankle 10/23; we have been using Hydrofera Blue since 2 weeks ago. This is on the left medial ankle. Dimensions are better 11/6; using Hydrofera Blue. I think the wound is smaller but still not closed. Left medial ankle 11/13; we have been using Hydrofera Blue. Wound is certainly no smaller this week. Also the surface not as good. This is the remanent of a very large area on her left medial ankle. 11/20; using Sorbact since last week. Wound was about the same in terms of size although I was disappointed about the surface debris 12/11; 3-week follow-up. Patient was on vacation. Wound is measuring slightly  larger we have been using Sorbact. 12/18; wound is about the same size however surface looks better last week after debridement. We have been using Sorbact under compression 1/15 wound is probably twice the size of last time increased in length nonviable surface. We have been using Sorbact. She was running a mild fever and missed her appointment last week 1/22; the wound is come down in size but under illumination still a very adherent debris we have been Hydrofera Blue that I changed her to last week 1/29; dimensions down slightly. We have been using Hydrofera Blue 2/19 dimensions are the same however there is rims of epithelialization under illumination. Therefore more the surface area may be epithelialized 2/26; the patient's wound actually measures smaller. The wound looks healthy. We have been using Hydrofera Blue. I had some thoughts about running Apligraf then I still may do that however this looks so much better this week we will delay that for now 3/5; the wound is small but about the same as last week. We have been using Hydrofera Blue. No debridement is required today. 3/19; the wound is about the size of a dime. Healthy looking wound even under illumination. We have been using Hydrofera Blue. No mechanical debridement is necessary 3/26; not much change from last week although still looks very healthy. We have been using Hydrofera Blue under Unna boots Patient was offered an ankle fusion by podiatry but not until the wound heals with a proceed with this. 4/9; the patient comes in today with her original wound on the medial ankle looking satisfactory however she has some uncontrolled swelling in the middle part of her leg with 2 new open areas superiorly just lateral to the tibia. I think this was probably a wrap issue. She said she felt uncomfortable during the week but did not call in. We have been using Hydrofera Blue 4/16; the wound on the medial ankle is about the same. She has  innumerable small areas superior to this across her mid tibia. I think this is probably folliculitis. She is also been working in the yard doing a lot of sweating 4/30; the patient issue on the upper areas across her mid tibia of all healed. I think this was excessive yard work if I remember. Her wound on the medial ankle is smaller. Some debris on this we have been using Hydrofera Blue under Unna boots 5/7; mid tibia. She has been using Hydrofera Blue under an Unna wrap. She is apparently going for her ankle surgery on June 3 10/28/19-Patient returns to clinic with the ankle wound, we are using Hydrofera Blue under Unna wrap, surgery is scheduled for her left foot for June 3 so she will be back for nurse visit next week READMISSION 01/17/2020 Mrs. Capurso is a 65 year old woman we have had in this clinic  for a long period of time with severe venous hypertension and refractory wounds on her medial lower legs and ankles bilaterally. 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 When she was off her legs for a prolonged period example when she fell and suffered a compression fracture things would heal up quite nicely. She is now retired and we managed to heal up the right medial leg wound. The left one was very tiny last time I saw this although still refractory. 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 It got to the point where this was interfering with her walking and she elected to proceed with a ankle arthrodesis to straighten her her ankle and leave her with a functional outcome for mobilization. The patient was referred to Dr. Doren Custard and really this took some time to arrange. Dr. Doren Custard saw her on 12/07/2019. Once again he verified that she had no arterial issues. She had previously had an  angiogram several years ago. Follow-up ABIs on the left showed an ABI of 1.12 with triphasic waveforms and a TBI of 0.92. She is felt to have chronic deep venous insufficiency but I do not think it was felt that anything could be done from about this from an ablation point of view. At the time Dr. Doren Custard saw this patient the wounds actually look closed via the pictures in his clinic. The patient finally underwent her surgery on 12/15/2019. This went reasonably well and there was a good anatomic outcome. She developed a small distal wound dehiscence on the lateral part of the surgical wound. However more problematically she is developed recurrence of the wound on the medial left ankle. There are actually 2 wounds here one in the distal lower leg and 1 pretty much at the level of the medial malleolus. It is a more distal area that is more problematic. She has been using Hydrofera Blue which started on Friday before this she was simply Ace wrapping. There was a culture done that showed Pseudomonas and she is on ciprofloxacin. A recent CNS on 8/11 was negative. The patient reports some pain but I generally think this is improving. She is using a cam boot completely nonweightbearing using a walker for pivot transfers and a wheelchair 8/24; not much improvement unfortunately she has a surgical wound on the lateral part in the venous insufficiency wound medially. The bottom part of the medial insufficiency wound is still necrotic there is exposed tendon here. We have been using Hydrofera Blue under compression. Her edema control is however better 8/31; patient in for follow-up of his surgical wound on the lateral part of her left leg and chronic venous insufficiency ulcers medially. We put her back in compression last week. She comes in today with a complaint of 3 or 4 days worth of increasing pain. She felt her cam walker was rubbing on the area on the back of her heel. However there is intense erythema seems  more likely she has cellulitis. She had 2 cultures done when she was seeing podiatry in the postop. One of them in late July showed Pseudomonas and she received a course of ciprofloxacin the other was negative on 8/11 she is allergic to penicillin with anaphylactoid complaints of hives oral swelling via information in epic 9/9; when I saw this patient  last week she had intense anterior erythema around her wound on the right lateral heel and ankle and also into the right medial heel. Some of this was no doubt drainage and her walker boot however I was convinced she had cellulitis. I gave her Levaquin and Bactrim she is finishing up on this now. She is following up with Dr. [name] he saw her yesterday. He is taken her out of the walking boot of course she is still nonweightbearing. Her x-ray was negative for any worrisome features such as soft tissue air etc. Things are a lot better this week. She has home health. We have been using Hydrofera Blue under an [employer] which she put back on yesterday. I did not wrap her last week 9/17; her surrounding skin looks a lot better. In fact the area on the left lateral ankle has just a scant amount of eschar. The only remaining wound is the large area on the left medial ankle. Probably about 60% of this is healthy granulation at the surface however she has a significant divot distally. This has adherent debris in it. I been using debridement and silver collagen to try and get this area to fill-in although I do not think we have made much progress this week 9/24; the patient's wound on the left medial ankle looks a lot better. The deeper divot area distally still requires debridement but this is cleaning up quite nicely we have been using silver collagen. The patient is complaining of swelling in her foot and is worried that that is contributing to the nonhealing of the ankle wound. She is also complaining of numbness in her anterior toes 10/4; left medial ankle.  The small area distally still has a divot with necrotic material that I have been debriding away. This has an undermining area. She is approved for Apligraf. She saw Dr. [name] her surgeon on 10/1. I think he declared himself is satisfied with the condition of things. Still nonweightbearing till the end of the month. 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 Her surgical wound is well closed. There is no evidence of infection 10/11; the patient arrived in clinic today with the expectation that we be able to put an Apligraf on this area after debridement however she arrives with a relatively large amount of green drainage on the dressing. The patient states that this started on Friday. She has not been systemically unwell. 10/19; culture I did last week showed both Enterococcus and Pseudomonas. I think this came in separate parts because I stopped her ciprofloxacin I gave her and prescribed her linezolid however now looking at the final culture result this is Pseudomonas which is resistant to quinolones. She has not yet picked up the linezolid apparently phone issues. We are also trying to get a topical antibiotic out of [place] in [place] they can be applied by home health. She is still having green drainage 10/16; the patient has her topical antibiotic from [facility] in [place]. This is a compounded gel with vancomycin and ciprofloxacin and gentamicin. We are applying this on the wound bed with silver alginate over the top with Unna boot wraps. She arrives in clinic today with a lot less ominous looking drainage although she is only use this topical preparation once the second time today. She sees Dr. [name] her surgeon on Friday she has home health changing the dressing 11/2; still using her compounded topical antibiotic under silver alginate. Surface is cleaning up there  is less drainage. We had an Apligraf for her today and I elected to apply it. A  light coating of her antibiotic 04/25/2020 upon evaluation today patient appears to be doing well with regard to her ankle ulcer. There is a little bit of slough noted on the surface of the wound I am can have to perform sharp debridement to clear this away today. With that being said other than that fact overall I feel like she is making progress and we do see some new epithelial growth. There is also some improvement in the depth of the wound and that distal portion. There is little bit of slough there as well. 12/7; 2-week follow-up. Apligraf #3. Dimensions are smaller. Closing in especially inferiorly. Still some surface debris. Still using the [facility] topical antibiotic but I told her that I don't think this needs to be renewed 12/21; 2-week follow-up. Apligraf #4 dimensions are smaller. Nice improvement 06/05/2020; 2-week follow-up. The patient's wound on the left medial ankle looks really excellent. Nice granulation. Advancing epithelialization no undermining no evidence of infection. We would have to reapply for another Apligraf but with the condition of this wound I did not feel strongly about it. We used Hydrofera Blue under the same degree of compression. She follows up with Dr. [name] her surgeon a week Friday 06/13/2020 upon evaluation today patient appears to be doing excellent in regard to her wound. She has been tolerating the dressing changes without complication. Fortunately there is no signs of active infection at this time. No fevers, chills, nausea, vomiting, or diarrhea. She was using Hydrofera Blue last week. 06/20/2020 06/20/2020 on evaluation today patient actually appears to be doing excellent in regard to her wound. This is measuring better and looking much better as well. She has been using the collagen that seems to be doing better for her as well even though the [facility] was and is not sticking or feeling as rough on her wound. She did see Dr. [name] on Friday he is very  pleased he also stated none of the hardware has shifted. That is great news 1/27; the patient has a small clean wound all that is remaining. I agree that this is too small to really consider an Apligraf. Under illumination the surface is looking quite good. We have been using collagen although the dimensions are not any better this week 2/2; the patient has a small clean wound on the left medial ankle. Although this left of her substantial original areas. Measurements are smaller. We have been using polymen Ag under an Haematologist. 2/10; small area on the left medial ankle. This looks clean nothing to debride however dimensions are about the same we have been using polymen I think now for 2 weeks 2/17; not much change in surface area. We have been using polymen Ag without any improvement. 3/17; 1 month follow-up. The patient has been using endoform without any improvement in fact I think this looks worse with more depth and more expansion 3/24; no improvement. Perhaps less debris on the surface. We have been using Sorbact for 1 week 4/4; wound measures larger. She has edema in her leg and her foot which she tells as her wrap came down. We have been using Unna boots. Sorbact of the wound. She has been approved for Apligraf Objective Constitutional Vitals Time Taken: 10:07 AM, Height: 68 in, Temperature: 97 F, Pulse: 63 bpm, Respiratory Rate: 17 breaths/min, Blood Pressure: 120/63 mmHg. Cardiovascular Pedal pulses are palpable. She has more swelling  in the legs and I am used to seeing. Nevertheless there is no warmth. This goes up to the level where I think her wrap would have come down and she seems to agree with me.. General Notes: Wound exam; fibrinous surface under illumination I removed with a #5 curette today. There is not an overabundance of this no evidence of surrounding infection Integumentary (Hair, Skin) Wound #15 status is Open. Original cause of wound was Gradually Appeared. The date  acquired was: 12/30/2019. The wound has been in treatment 32 weeks. The wound is located on the Left,Medial Malleolus. The wound measures 2.2cm length x 1.6cm width x 0.2cm depth; 2.765cm^2 area and 0.553cm^3 volume. There is Fat Layer (Subcutaneous Tissue) exposed. There is no tunneling or undermining noted. There is a medium amount of serosanguineous drainage noted. The wound margin is well defined and not attached to the wound base. There is large (67-100%) red granulation within the wound bed. There is a small (1-33%) amount of necrotic tissue within the wound bed including Adherent Slough. 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 There was a Excisional Skin/Subcutaneous Tissue Debridement with a total area of 3.52 sq cm performed by [name]., MD. With the following instrument(s): Curette to remove Viable and Non-Viable tissue/material. Material removed includes Subcutaneous Tissue and Slough and. No specimens were taken. A time out was conducted at 10:36, prior to the start of the procedure. A Minimum amount of bleeding was controlled with Pressure. The procedure was tolerated well with a pain level of 4 throughout and a pain level of 2 following the procedure. Post Debridement Measurements: 2.2cm length x 1.6cm width x 0.2cm depth; 0.553cm^3 volume. Character of Wound/Ulcer Post Debridement is improved. Severity of Tissue Post Debridement is: Fat layer exposed. Post procedure Diagnosis Wound #15: Same as Pre-Procedure Pre-procedure diagnosis of Wound #15 is a Venous  Leg Ulcer located on the Left,Medial Malleolus . There was a Haematologist Compression Therapy Procedure by Levan Hurst, RN. Post procedure Diagnosis Wound #15: Same as Pre-Procedure Plan Follow-up Appointments: Return Appointment in 1 week. Bathing/ Shower/ Hygiene: May shower with protection but do not get wound dressing(s) wet. Edema Control - Lymphedema / SCD / Other: Elevate legs to the level of the heart or above for 30 minutes daily and/or when sitting, a frequency of: - throughout the day Avoid standing for long periods of time. Exercise regularly Compression stocking or Garment 20-30 mm/Hg pressure to: - right leg daily WOUND #15: - Malleolus Wound Laterality: Left, Medial Cleanser: Soap and Water 1 x Per Week/ Discharge Instructions: May shower and wash wound with dial antibacterial soap and water prior to dressing change. Peri-Wound Care: Sween Lotion (Moisturizing lotion) 1 x Per Week/ Discharge Instructions: Apply moisturizing lotion as directed Prim Dressing: Cutimed Sorbact Swab 1 x Per Week/ ary Discharge Instructions: Apply thin layer of hydrogel over Sorbact Secondary Dressing: Woven Gauze Sponge, Non-Sterile 4x4 in 1 x Per Week/ Discharge Instructions: Apply over primary dressing as directed. Secondary Dressing: ABD Pad, 5x9 1 x Per Week/ Discharge Instructions: Apply over primary dressing as directed. Com pression Wrap: Unnaboot w/Calamine, 4x10 (in/yd) 1 x Per Week/ Discharge  Instructions: Apply Unnaboot as directed. 1. Continue Sorbact 2. Apligraf the next time she is here 3. I talked to her about putting standard compression instead of Unna boots however she says she cannot fit her foot into her shoe. 4. She already called Dr. [name] about this but I do not think the edema is coming from her ankle least not all of it because there is swelling right up to where her wrap came down. 5. No evidence of infection or DVT Electronic Signature(s) Signed: 09/03/2020 4:59:32  PM By: Linton Ham MD Entered By: Linton Ham on 09/03/2020 10:49:22 -------------------------------------------------------------------------------- SuperBill Details Patient Name: Date of Service: [name], [name] [name]. 09/03/2020 Medical Record Number: [id number] Patient Account Number: [id number] Date of Birth/Sex: Treating RN: 12/04/1955 (65 y.o. [name] Primary Care Provider: [name] Other Clinician: Referring Provider: Treating Provider/Extender: [name] in Treatment: 32 Diagnosis Coding ICD-10 Codes Code Description [phone] Chronic venous hypertension (idiopathic) with ulcer and inflammation of left lower extremity L97.828 Non-pressure chronic ulcer of other part of left lower leg with other specified severity L97.328 Non-pressure chronic ulcer of left ankle with other specified severity Facility Procedures CPT4 Code: [id number] Description: [id number] - DEB SUBQ TISSUE 20 SQ CM/< ICD-10 Diagnosis Description I87.332 Chronic venous hypertension (idiopathic) with ulcer and inflammation of left lo L97.328 Non-pressure chronic ulcer of left ankle with other specified severity Modifier: wer extremity Quantity: 1 Physician Procedures : CPT4 Code Description Modifier [id number] - WC PHYS SUBQ TISS 20 SQ CM ICD-10 Diagnosis Description I87.332 Chronic venous hypertension (idiopathic) with ulcer and inflammation of left lower extremity L97.328 Non-pressure chronic ulcer of left ankle  with other specified severity Quantity: 1 Electronic Signature(s) Signed: 09/03/2020 4:59:32 PM By: Linton Ham MD Signed: 09/03/2020 4:59:32 PM By: Linton Ham MD Entered By: Linton Ham on 09/03/2020 10:49:42

## 2020-09-05 NOTE — Progress Notes (Signed)
[name], [name] ([id number]) Visit Report for 09/03/2020 Arrival Information Details Patient Name: Date of Service: [facility], [name] 09/03/2020 10:00 A M Medical Record Number: [id number] Patient Account Number: [id number] Date of Birth/Sex: Treating RN: 06/17/1955 (65 y.o. [name], [name] Primary Care Shailey Butterbaugh: [name] Other Clinician: Referring Derius Ghosh: Treating Kieran Nachtigal/Extender: [name] in Treatment: 57 Visit Information History Since Last Visit Added or deleted any medications: No Patient Arrived: Cane Any new allergies or adverse reactions: No Arrival Time: 10:03 Had a fall or experienced change in No Accompanied By: self activities of daily living that may affect Transfer Assistance: None risk of falls: Patient Identification Verified: Yes Signs or symptoms of abuse/neglect since last visito No Secondary Verification Process Completed: Yes Hospitalized since last visit: No Patient Requires Transmission-Based Precautions: No Implantable device outside of the clinic excluding No Patient Has Alerts: Yes cellular tissue based products placed in the center Patient Alerts: L ABI =1.12, TBI = .92 since last visit: R ABI= 1.02, TBI= .58 Has Dressing in Place as Prescribed: Yes Pain Present Now: No Electronic Signature(s) Signed: 09/05/2020 5:28:50 PM By: [name] RN Entered By: [name] on 09/03/2020 10:06:24 -------------------------------------------------------------------------------- Compression Therapy Details Patient Name: Date of Service: [name], [name] 09/03/2020 10:00 A M Medical Record Number: [id number] Patient Account Number: [id number] Date of Birth/Sex: Treating RN: December 26, 1955 (65 y.o. [name] Primary Care Amethyst Gainer: [name] Other Clinician: Referring Prim Morace: Treating Antonyo Hinderer/Extender: [name] in Treatment: 32 Compression Therapy Performed for Wound  Assessment: Wound #15 Left,Medial Malleolus Performed By: Clinician Levan Hurst, RN Compression Type: Rolena Infante Post Procedure Diagnosis Same as Pre-procedure Electronic Signature(s) Signed: 09/03/2020 5:00:21 PM By: Levan Hurst RN, BSN Entered By: Levan Hurst on 09/03/2020 10:41:25 -------------------------------------------------------------------------------- Encounter Discharge Information Details Patient Name: Date of Service: [name], [name] 09/03/2020 10:00 A M Medical Record Number: [id number] Patient Account Number: [id number] Date of Birth/Sex: Treating RN: Nov 15, 1955 (65 y.o. [name] Primary Care Rahsaan Weakland: [name] Other Clinician: Referring Lydie Stammen: Treating Rosevelt Luu/Extender: [name] in Treatment: 32 Encounter Discharge Information Items Post Procedure Vitals Discharge Condition: Stable Temperature (F): 97 Ambulatory Status: Cane Pulse (bpm): 63 Discharge Destination: Home Respiratory Rate (breaths/min): 17 Transportation: Private Auto Blood Pressure (mmHg): 120/63 Schedule Follow-up Appointment: Yes Clinical Summary of Care: Provided on 09/03/2020 Form Type Recipient Paper Patient Patient Electronic Signature(s) Signed: 09/03/2020 10:44:51 AM By: [name] Previous Signature: 09/03/2020 10:44:28 AM Version By: [name] Entered By: [name] on 09/03/2020 10:44:51 -------------------------------------------------------------------------------- Lower Extremity Assessment Details Patient Name: Date of Service: [name], [name] 09/03/2020 10:00 A M Medical Record Number: [id number] Patient Account Number: [id number] Date of Birth/Sex: Treating RN: 09/04/1955 (65 y.o. [name], [name] Primary Care Waqas Bruhl: [name] Other Clinician: Referring Rorik Vespa: Treating Harrison Zetina/Extender: [name] in Treatment: 32 Edema Assessment Assessed: [name]: Yes] [name]:  No] Edema: [Left: Ye] [Right: s] Calf Left: Right: Point of Measurement: 31 cm From Medial Instep 25 cm Ankle Left: Right: Point of Measurement: 11 cm From Medial Instep 21.5 cm Vascular Assessment Pulses: Dorsalis Pedis Palpable: [Left:Yes] Posterior Tibial Palpable: [Left:Yes] Electronic Signature(s) Signed: 09/05/2020 5:28:50 PM By: [name] RN Entered By: [name] on 09/03/2020 10:12:55 -------------------------------------------------------------------------------- Multi Wound Chart Details Patient Name: Date of Service: [name], [name] 09/03/2020 10:00 A M Medical Record Number: [id number] Patient Account Number: [id number] Date of Birth/Sex: Treating RN: 1956/04/18 (65  y.o. [name] Primary Care Kelsie Zaborowski: [name] Other Clinician: Referring Kaylen Motl: Treating Nicoli Nardozzi/Extender: [name] in Treatment: 32 Vital Signs Height(in): 68 Pulse(bpm): 87 Weight(lbs): Blood Pressure(mmHg): 120/63 Body Mass Index(BMI): Temperature(F): 97 Respiratory Rate(breaths/min): 17 Photos: [15:No Photos Left, Medial Malleolus] [N/A:N/A N/A] Wound Location: [15:Gradually Appeared] [N/A:N/A] Wounding Event: [15:Venous Leg Ulcer] [N/A:N/A] Primary Etiology: [15:Peripheral Venous Disease,] [N/A:N/A] Comorbid History: [15:Osteoarthritis, Confinement Anxiety 12/30/2019] [N/A:N/A] Date Acquired: [15:32] [N/A:N/A] Weeks of Treatment: [15:Open] [N/A:N/A] Wound Status: [15:2.2x1.6x0.2] [N/A:N/A] Measurements L x W x D (cm) [15:2.765] [N/A:N/A] A (cm) : rea [15:0.553] [N/A:N/A] Volume (cm) : [15:76.30%] [N/A:N/A] % Reduction in A [15:rea: 94.10%] [N/A:N/A] % Reduction in Volume: [15:Full Thickness With Exposed Support N/A] Classification: [15:Structures Medium] [N/A:N/A] Exudate A mount: [15:Serosanguineous] [N/A:N/A] Exudate Type: [15:red, brown] [N/A:N/A] Exudate Color: [15:Well defined, not attached] [N/A:N/A] Wound  Margin: [15:Large (67-100%)] [N/A:N/A] Granulation A mount: [15:Red] [N/A:N/A] Granulation Quality: [15:Small (1-33%)] [N/A:N/A] Necrotic A mount: [15:Fat Layer (Subcutaneous Tissue): Yes N/A] Exposed Structures: [15:Fascia: No Tendon: No Muscle: No Joint: No Bone: No Medium (34-66%)] [N/A:N/A] Epithelialization: [15:Debridement - Excisional] [N/A:N/A] Debridement: Pre-procedure Verification/Time Out 10:36 [N/A:N/A] Taken: [15:Subcutaneous, Slough] [N/A:N/A] Tissue Debrided: [15:Skin/Subcutaneous Tissue] [N/A:N/A] Level: [15:3.52] [N/A:N/A] Debridement A (sq cm): [15:rea Curette] [N/A:N/A] Instrument: [15:Minimum] [N/A:N/A] Bleeding: [15:Pressure] [N/A:N/A] Hemostasis A chieved: [15:4] [N/A:N/A] Procedural Pain: [15:2] [N/A:N/A] Post Procedural Pain: [15:Procedure was tolerated well] [N/A:N/A] Debridement Treatment Response: [15:2.2x1.6x0.2] [N/A:N/A] Post Debridement Measurements L x W x D (cm) [15:0.553] [N/A:N/A] Post Debridement Volume: (cm) [15:Compression Therapy] [N/A:N/A] Procedures Performed: [15:Debridement] Treatment Notes Wound #15 (Malleolus) Wound Laterality: Left, Medial Cleanser Soap and Water Discharge Instruction: May shower and wash wound with dial antibacterial soap and water prior to dressing change. Peri-Wound Care Sween Lotion (Moisturizing lotion) Discharge Instruction: Apply moisturizing lotion as directed Topical Primary Dressing Cutimed Sorbact Swab Discharge Instruction: Apply thin layer of hydrogel over Sorbact Secondary Dressing Woven Gauze Sponge, Non-Sterile 4x4 in Discharge Instruction: Apply over primary dressing as directed. ABD Pad, 5x9 Discharge Instruction: Apply over primary dressing as directed. Secured With Compression Wrap Unnaboot w/Calamine, 4x10 (in/yd) Discharge Instruction: Apply Unnaboot as directed. Compression Stockings Add-Ons Electronic Signature(s) Signed: 09/03/2020 4:59:32 PM By: Linton Ham MD Signed: 09/03/2020  5:00:21 PM By: Levan Hurst RN, BSN Entered By: Linton Ham on 09/03/2020 10:46:14 -------------------------------------------------------------------------------- Multi-Disciplinary Care Plan Details Patient Name: Date of Service: [facility] [name], [name] 09/03/2020 10:00 A M Medical Record Number: [id number] Patient Account Number: [id number] Date of Birth/Sex: Treating RN: 1956-02-20 (65 y.o. [name] Primary Care Shiasia Porro: [name] Other Clinician: Referring Jaunice Mirza: Treating Izrael Peak/Extender: [name] in Treatment: 61 Multidisciplinary Care Plan reviewed with physician Active Inactive Venous Leg Ulcer Nursing Diagnoses: Actual venous Insuffiency (use after diagnosis is confirmed) Knowledge deficit related to disease process and management Goals: Patient will maintain optimal edema control Date Initiated: 06/20/2020 Target Resolution Date: 10/05/2020 Goal Status: Active Interventions: Assess peripheral edema status every visit. Compression as ordered Treatment Activities: Therapeutic compression applied : 06/20/2020 Notes: Wound/Skin Impairment Nursing Diagnoses: Impaired tissue integrity Knowledge deficit related to ulceration/compromised skin integrity Goals: Patient/caregiver will verbalize understanding of skin care regimen Date Initiated: 01/17/2020 Target Resolution Date: 10/05/2020 Goal Status: Active Ulcer/skin breakdown will have a volume reduction of 30% by week 4 Date Initiated: 01/17/2020 Date Inactivated: 03/12/2020 Target Resolution Date: 03/09/2020 Goal Status: Met Interventions: Assess patient/caregiver ability to obtain necessary supplies Assess patient/caregiver ability to perform ulcer/skin care regimen upon admission and as needed Assess ulceration(s) every visit Provide education on  ulcer and skin care Notes: Electronic Signature(s) Signed: 09/03/2020 5:00:21 PM By: Levan Hurst RN, BSN Entered By:  Levan Hurst on 09/03/2020 10:26:38 -------------------------------------------------------------------------------- Pain Assessment Details Patient Name: Date of Service: [name], [name] 09/03/2020 10:00 A M Medical Record Number: [id number] Patient Account Number: [id number] Date of Birth/Sex: Treating RN: 1955/06/09 (65 y.o. [name], [name] Primary Care Lindyn Vossler: [name] Other Clinician: Referring Bhargav Barbaro: Treating Makya Phillis/Extender: [name] in Treatment: 32 Active Problems Location of Pain Severity and Description of Pain Patient Has Paino No Site Locations Pain Management and Medication Current Pain Management: Electronic Signature(s) Signed: 09/05/2020 5:28:50 PM By: [name] RN Entered By: [name] on 09/03/2020 10:16:05 -------------------------------------------------------------------------------- Patient/Caregiver Education Details Patient Name: Date of Service: [name], [name] 4/4/2022andnbsp10:00 A M Medical Record Number: [id number] Patient Account Number: [id number] Date of Birth/Gender: Treating RN: 05-12-1956 (65 y.o. [name] Primary Care Physician: [name] Other Clinician: Referring Physician: Treating Physician/Extender: [name] in Treatment: 68 Education Assessment Education Provided To: Patient Education Topics Provided Wound/Skin Impairment: Methods: Explain/Verbal Responses: State content correctly [employer]) Signed: 09/03/2020 5:00:21 PM By: Levan Hurst RN, BSN Entered By: Levan Hurst on 09/03/2020 10:26:54 -------------------------------------------------------------------------------- Wound Assessment Details Patient Name: Date of Service: [name], [name] 09/03/2020 10:00 A M Medical Record Number: [id number] Patient Account Number: [id number] Date of Birth/Sex: Treating RN: 04-21-1956 (65 y.o. [name],  [name] Primary Care Joannie Medine: [name] Other Clinician: Referring Hideko Esselman: Treating Shaman Muscarella/Extender: [name] in Treatment: 32 Wound Status Wound Number: 15 Primary Venous Leg Ulcer Etiology: Wound Location: Left, Medial Malleolus Wound Status: Open Wounding Event: Gradually Appeared Comorbid Peripheral Venous Disease, Osteoarthritis, Confinement Date Acquired: 12/30/2019 History: Anxiety Weeks Of Treatment: 32 Clustered Wound: No Photos Wound Measurements Length: (cm) 2.2 Width: (cm) 1.6 Depth: (cm) 0.2 Area: (cm) 2.765 Volume: (cm) 0.553 % Reduction in Area: 76.3% % Reduction in Volume: 94.1% Epithelialization: Medium (34-66%) Tunneling: No Undermining: No Wound Description Classification: Full Thickness With Exposed Support Structures Wound Margin: Well defined, not attached Exudate Amount: Medium Exudate Type: Serosanguineous Exudate Color: red, brown Foul Odor After Cleansing: No Slough/Fibrino Yes Wound Bed Granulation Amount: Large (67-100%) Exposed Structure Granulation Quality: Red Fascia Exposed: No Necrotic Amount: Small (1-33%) Fat Layer (Subcutaneous Tissue) Exposed: Yes Necrotic Quality: Adherent Slough Tendon Exposed: No Muscle Exposed: No Joint Exposed: No Bone Exposed: No Treatment Notes Wound #15 (Malleolus) Wound Laterality: Left, Medial Cleanser Soap and Water Discharge Instruction: May shower and wash wound with dial antibacterial soap and water prior to dressing change. Peri-Wound Care Sween Lotion (Moisturizing lotion) Discharge Instruction: Apply moisturizing lotion as directed Topical Primary Dressing Cutimed Sorbact Swab Discharge Instruction: Apply thin layer of hydrogel over Sorbact Secondary Dressing Woven Gauze Sponge, Non-Sterile 4x4 in Discharge Instruction: Apply over primary dressing as directed. ABD Pad, 5x9 Discharge Instruction: Apply over primary dressing as directed. Secured  With Compression Wrap Unnaboot w/Calamine, 4x10 (in/yd) Discharge Instruction: Apply Unnaboot as directed. Compression Stockings Add-Ons Electronic Signature(s) Signed: 09/04/2020 2:26:07 PM By: [name] Signed: 09/05/2020 5:28:50 PM By: [name] RN Entered By: [name] on 09/03/2020 16:16:24 -------------------------------------------------------------------------------- Vitals Details Patient Name: Date of Service: [name], [name] 09/03/2020 10:00 A M Medical Record Number: [id number] Patient Account Number: [id number] Date of Birth/Sex: Treating RN: [birth year] (65 y.o. [name] Primary Care Marysa [name]: [name] Other Clinician: Referring Adora Yeh: Treating Terrance Lanahan/Extender: [name] in Treatment: [phone]  Vital Signs Time Taken: 10:07 Temperature (F): 97 Height (in): 68 Pulse (bpm): 63 Respiratory Rate (breaths/min): 17 Blood Pressure (mmHg): 120/63 Reference Range: 80 - 120 mg / dl Electronic Signature(s) Signed: 09/05/2020 5:28:50 PM By: [name] RN Entered By: [name] on 09/03/2020 10:07:40

## 2020-09-11 ENCOUNTER — Ambulatory Visit: Payer: Medicare Other | Admitting: Podiatry

## 2020-09-12 ENCOUNTER — Other Ambulatory Visit: Payer: Self-pay

## 2020-09-12 ENCOUNTER — Encounter (HOSPITAL_BASED_OUTPATIENT_CLINIC_OR_DEPARTMENT_OTHER): Payer: Medicare Other | Admitting: Physician Assistant

## 2020-09-12 DIAGNOSIS — I87332 Chronic venous hypertension (idiopathic) with ulcer and inflammation of left lower extremity: Secondary | ICD-10-CM | POA: Diagnosis not present

## 2020-09-12 NOTE — Progress Notes (Addendum)
[name], [name] ([id number]) Visit Report for 09/12/2020 Chief Complaint Document Details Patient Name: Date of Service: [facility] [name], [name] 09/12/2020 10:00 A M Medical Record Number: [id number] Patient Account Number: [id number] Date of Birth/Sex: Treating RN: Oct 06, 1955 (65 y.o. [name]: [name] Other Clinician: Referring [name]: Treating [name]/Extender: [name] in Treatment: 25 Information Obtained from: Patient Chief Complaint patient is been followed long-term in this clinic for venous insufficiency ulcers with inflammation, hypertension and ulceration over the medial ankle bilaterally. 01/17/2020; this is a patient who is here for review of postoperative wounds on the left lateral ankle and recurrence of venous stasis ulceration on the left medial Electronic Signature(s) Signed: 09/12/2020 10:47:28 AM By: [name] Keeler PA-C Entered By: [name] on 09/12/2020 10:47:28 -------------------------------------------------------------------------------- Cellular or Tissue Based Product Details Patient Name: Date of Service: [facility] [name], [name] 09/12/2020 10:00 A M Medical Record Number: [id number] Patient Account Number: [id number] Date of Birth/Sex: Treating RN: 05-22-1956 (65 y.o. [name]: [name] Other Clinician: Referring [name]: Treating [name]/Extender: [name] in Treatment: 47 Cellular or Tissue Based Product Type Wound #15 Left,Medial Malleolus Applied to: Performed By: Physician [name] Keeler, PA Cellular or Tissue Based Product Type: Apligraf Level of Consciousness (Pre-procedure): Awake and Alert Pre-procedure Verification/Time Out Yes - 11:10 Taken: Location: trunk / arms / legs Wound Size (sq cm): 4.25 Product Size (sq cm): 44 Waste Size (sq cm): 33 Waste Reason: wound size Amount of Product Applied (sq cm):  11 Instrument Used: Blade, Forceps, Scissors Lot #: [id number] Order #: 1 Expiration Date: 09/18/2020 Fenestrated: Yes Instrument: Blade Reconstituted: Yes Solution Type: saline Solution Amount: 63m Lot #:[id number] Expiration Date: 11/30/2021 Secured: Yes Secured With: Steri-Strips Dressing Applied: Yes Primary Dressing: adaptic, gauze, ABD Procedural Pain: 0 Post Procedural Pain: 0 Response to Treatment: Procedure was tolerated well Level of Consciousness (Post- Awake and Alert procedure): Post Procedure Diagnosis Same as Pre-procedure Electronic Signature(s) Signed: 09/12/2020 5:03:59 PM By: [name] KeelerPA-C Signed: 09/12/2020 5:38:32 PM By: BBaruch GoutyRN, BSN Entered By: BBaruch [name] 09/12/2020 11:18:26 -------------------------------------------------------------------------------- HPI Details Patient Name: Date of Service: [name], [name] 09/12/2020 10:00 A M Medical Record Number: [id number] Account Number: [id number] of Birth/Sex: Treating RN: 910/09/1955(65y.o. FElam DutchPrimary [name] [name]: RLennie OdorOther Clinician: Referring [name]: Treating [name]/Extender: SMerla Richesin Treatment: 345History of Present Illness HPI Description: the remaining wound is over the left medial ankle. Similar wound over the right medial ankle healed largely with use of Apligraf. Most recently we have been using Hydrofera Blue over this wound with considerable improvement. The patient has been extensively worked up in the past for her venous insufficiency and she is not a candidate for antireflux surgery although I have none of the details available currently. 08/24/14; considerable improvement today. About 50% of this wound areas now epithelialized. The base of the wound appears to be healthier granulation.as opposed to last week when she had deteriorated a considerable improvement 08/17/14;  unfortunately the wound has regressed somewhat. The areas of epithelialization from the superior aspect are not nearly as healthy as they were last week. The patient thinks her Hydrofera Blue slipped. 09/07/14; unfortunately the area has markedly regressed in the 2 weeks since I've seen this. There is an [name] surrounding erythema. The healthy granulation tissue that we  had at the base of the wound now is a dusky color. The nurse reports green drainage 09/14/14; the area looks somewhat better than last week. There is less erythema and less drainage. The culture I did did not show any growth. Nevertheless I think it is better to continue the Cipro and doxycycline for a further week. The remaining wound area was debridement. 09/21/14. Wound did not require debridement last week. Still less erythema and less drainage. She can complete her antibiotics. The areas of epithelialization in the superior aspect of the wound do not look as healthy as they did some weeks ago 10/05/14 continued improvement in the condition of this wound. There is advancing epithelialization. Less aggressive debridement required 10/19/14 continued improvement in the condition and volume of this wound. Less aggressive debridement to the inferior part of this to remove surface slough and fibrinous eschar 11/02/14 no debridement is required. The surface granulation appears healthy although some of her islands of epithelialization seem to have regressed. No evidence of infection 11/16/14; lites surface debridement done of surface eschar. The wound does not look to be unhealthy. No evidence of infection. Unfortunately the patient has had podiatry issues in the right foot and for some reason has redeveloped small surface ulcerations in the medial right ankle. Her original presentation involved wounds in this area 11/23/14 no debridement. The area on the right ankle has enlarged. The left ankle wound appears stable in terms of the surface although  there is periwound inflammation. There has been regression in the amount of new skin 11/30/14 no debridement. Both wound areas appear healthy. There was no evidence of infection. The the new area on the right medial ankle has enlarged although that both the surfaces appear to be stable. 12/07/14; Debridement of the right medial ankle wound. No no debridement was done on the left. 12/14/14 no major change in and now bilateral medial ankle wounds. Both of these are very painful but the no overt evidence of infection. She has had previous venous ablation 12/21/14; patient states that her right medial ankle wound is considerably more painful last week than usual. Her left is also somewhat painful. She could not tolerate debridement. The right medial ankle wound has fibrinous surface eschar 12/28/14 this is a patient with severe bilateral venous insufficiency ulcers. For a considerable period of time we actually had the one on the right medial ankle healed however this recently opened up again in June. The left medial ankle wound has been a refractory area with some absent flows. We had some success with Hydrofera Blue on this area and it literally closed by 50% however it is recently opened up Foley. Both of these were debridement today of surface eschar. She tolerates this poorly 01/25/15: No change in the status of this. Thick adherent escar. Very poor tolerance of any attempt at debridement. I had healed the right medial malleolus wound for a considerable amount of time and had the left one down to about 50% of the volume although this is totally regressed over the last 48 weeks. Further the right leg has reopened. she is trying to make a appointment with pain and vascular, previous ablations with Dr. Aleda Grana. I do not believe there is an arterial insufficiency issue here 02/01/15 the status of the adherent eschar bilaterally is actually improved. No debridement was done. She did not manage to get  vascular studies done 02/08/15 continued debridement of the area was done today. The slough is less adherent and comes off with less  pressure. There is no surrounding infection peripheral pulses are intact 02/15/15 selective debridement with a disposable curette. Again the slough is less adherent and comes off with less difficulty. No surrounding infection peripheral pulses are intact. 02/22/15 selective debridement of the right medial ankle wound. Slough comes off with less difficulty. No obvious surrounding infection peripheral pulses are intact I did not debridement the one on the left. Both of these are stable to improved 03/01/15 selective debridement of both wound areas using a curette to. Adherent slough cup soft with less difficulty. No obvious surrounding infection. The patient tells me that 2 days ago she noted a rash above the right leg wrap. She did not have this on her lower legs when she change this over she arrives with widespread left greater than right almost folliculitis-looking rash which is extremely pruritic. I don't see anything to culture here. There is no rash on the rest of her body. She feels well systemically. 03/08/15; selective debridement of both wounds using a curette. Base of this does not look unhealthy. She had limegreen drainage coming out of the left leg wound and describes a lot of drainage. The rash on her left leg looks improved to. No cultures were done. 03/22/15; patient was not here last week. Basal wounds does not look healthy and there is no surrounding erythema. No drainage. There is still a rash on the left leg that almost looks vasculitic however it is clearly limited to the top of where the wrap would be. 04/05/15; on the right required a surgical debridement of surface eschar and necrotic subcutaneous tissue. I did not debridement the area on the left. These continue to be large open wounds that are not changing that much. We were successful at one point in  healing the area on the right, and at the same time the area on the left was roughly half the size of current measurements. I think a lot of the deterioration has to do with the prolonged time the patient is on her feet at work 04/19/15 I attempted-like surface debridement bilaterally she does not tolerate this. She tells me that she was in allergic [name] yesterday with extreme pain over her left lateral malleolus/ankle and was told that she has an "sprain" 05/03/15; large bilateral venous insufficiency wounds over the medial malleolus/medial aspect of her ankles. She complains of copious amounts of drainage and his usual large amounts of pain. There is some increasing erythema around the wound on the right extending into the medial aspect of her foot to. historically she came in with these wounds the right one healed and the left one came down to roughly half its current size however the right one is reopened and the left is expanded. 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 05/10/15 large bilateral venous insufficiency wounds. There is less adherence surface left however the surface culture that I did last week grew pseudomonas therefore bilateral selective debridement score necessary. There is surrounding erythema. The patient describes severe bilateral drainage and a lot of pain in the left ankle. Apparently her podiatrist was were ready to do a cortisone shot 05/17/15; the patient complains of pain and again copious amounts of drainage. 05/24/15; we used Iodo flex last week. Patient notes considerable improvement in wound drainage. Only needed to change this once. 05/31/15; we continued Iodoflex; the base of these large wounds bilaterally is not too bad but there is probably likely  a significant bioburden here. I would like to debridement just doesn't tolerate it. 06/06/14 I would like to continue the Iodoflex although she still hasn't managed to  obtain supplies. She has bilateral medial malleoli or large wounds which are mostly superficial. Both of them are covered circumferentially with some nonviable fibrinous slough although she tolerates debridement very poorly. She apparently has an appointment for an ablation on the right leg by interventional radiology. 06/14/15; the patient arrives with the wounds and static condition. We attempted a debridement although she does not do well with this secondary to pain. I 07/05/15; wounds are not much smaller however there appears to be a cleaner granulating base. The left has tight fibrinous slough greater than the right. Debridement is tolerated poorly due to pain. Iodoflex is done more for these wounds in any of the multitude of different dressings I have tried on the left 1 and then subsequently the right. 07/12/15; no change in the condition of this wound. I am able to do an aggressive debridement on the right but not the left. She simply cannot tolerate it. We have been using Iodoflex which helps somewhat. It is worthwhile remembering that at one point we healed the right medial ankle wound and the left was about 25% of the current circumference. We have suggested returning to vascular surgery for review of possible further ablations for one reason or another she has not been able to do this. 07/26/15 no major change in the condition of either wound on her medial ankle. I did not attempt to debridement of these. She has been aggressively scrubbing these while she is in the shower at home. She has her supply of Iodoflex which seems to have done more for these wounds then anything I have put on recently. 08/09/15 wound area appears larger although not verified by measurements. Using Iodoflex 09/05/2015 -- she was here for avisit today but had significant problems with the wound and I was asked to see her for a physician opinion. I have summarize that this lady has had surgery on her left lower extremity  about 10 years ago where the possible veins stripping was done. She has had an opinion from interventional radiology around November 2016 where no further sclerotherapy was ordered. The patient works 12 hours a day and stands on a concrete floor with work boots and is unable to get the proper compression she requires and cannot elevate her limbs appropriately at any given time. She has recently grown Pseudomonas from her wound culture but has not started her ciprofloxacin which was called in for her. 09/13/15 this continues to be a difficult situation for this patient. At one point I had this wound down to a 1.5 x 1.5" wound on her left leg. This is deteriorated and the right leg has reopened. She now has substantial wounds on her medial calcaneus, malleoli and into her lower leg. One on the left has surface eschar but these are far too painful for me to debridement here. She has a vascular surgery appointment next week to see if anything can be done to help here. I think she has had previous ablations several years ago at [facility]. She has no major edema. She tells me that she did not get product last time [facility] and went for several days without it. She continues to work in work boots 12 hours a day. She cannot get compression/4-layer under her work boots. 09/20/15 no major change. Periwound edema control was not very good.  Her point with pain and vascular is next Wednesday the 25th 09/28/15; the patient is seen vascular surgery and is apparently scheduled for repeat duplex ultrasounds of her bilateral lower legs next week. 10/05/15; the patient was seen by Dr. Doren Custard of vascular surgery. He feels that she should have arterial insufficiency excluded as cause/contributed to her nonhealing stage she is therefore booked for an arteriogram. She has apparently monophasic signals in the dorsalis pedis pulses. She also of course has known severe chronic venous insufficiency with previous procedures as  noted previously. I had another long discussion with the patient today about her continuing to work 12 hour shifts. I've written her out for 2 months area had concerns about this as her work location is currently undergoing significant turmoil and this may lead to her termination. She is aware of this however I agree with her that she simply cannot continue to stand for 12 hours multiple days a week with the substantial wound areas she has. 10/19/15; the Dr. Doren Custard appointment was largely for an arteriogram which was normal. She does not have an arterial issue. He didn't make a comment about her chronic venous insufficiency for which she has had previous ablations. Presumably it was not felt that anything additional could be done. The patient is now out of work as I prescribed 2 weeks ago. Her wounds look somewhat less aggravated presumably because of this. I felt I would give debridement another try today 10/25/15; no major change in this patient's wounds. We are struggling to get her product that she can afford into her own home through her insurance. 11/01/15; no major change in the patient's wounds. I have been using silver alginate as the most affordable product. I spoke to Dr. Marla Roe last week with her requested take her to the OR for surgical debridement and placement of ACEL. Dr. Marla Roe told me that she would be willing to do this however [facility] will not cover this, fortunately the patient has [place] healthcare of some variant 11/08/15; no major change in the patient's wounds. She has been completely nonviable surface that this but is in too much pain with any attempted debridement are clinic. I have arranged for her to see Dr. Marla Roe ham of plastic surgery and this appointment is on Monday. I am hopeful that they will take her to the OR for debridement, possible ACEL ultimately possible skin graft 11/22/15 no major change in the patient's wounds over her bilateral medial  calcaneus medial malleolus into the lower legs. Surface on these does not look too bad however on the left there is surrounding erythema and tenderness. This may be cellulitis or could him sleepy tinea. 11/29/15; no major changes in the patient's wounds over her bilateral medial malleolus. There is no infection here and I don't think any additional antibiotics are necessary. There is now plan to move forward. She sees Dr. Marla Roe in a week's time for preparation for operative debridement and ACEL placement I believe on 7/12. She then has a follow-up appointment with Dr. Marla Roe on 7/21 12/28/15; the patient returns today having been taken to the [facility] by Dr. Marla Roe 12/12/15 she underwent debridement, intraoperative cultures [which were negative]. She had placement of a wound VAC. Parent really ACEL was not available to be placed. The wound VAC foam apparently adhered to the wound since then she's been using silver alginate, Xeroform under Ace wraps. She still says there is a lot of drainage and a lot of pain 01/31/16; this is  a patient I see monthly. I had referred her to Dr. Marla Roe him of plastic surgery for large wounds on her bilateral medial ankles. She has been to the OR twice once in early July and once in early August. She tells me over the last 3 weeks she has been using the wound VAC with ACEL underneath it. On the right we've simply been using silver alginate. Under Kerlix Coban wraps. 02/28/16; this is a patient I'm currently seeing monthly. She is gone on to have a skin graft over her large venous insufficiency ulcer on the left medial ankle. This was done by Dr. Marla Roe him. The patient is a bit perturbed about why she didn't have one on her right medial ankle wound. She has been using silver alginate to this. 03/06/16; I received a phone call from her plastic surgery Dr. Marla Roe. She expressed some concern about the viability of the skin graft she did on the left medial ankle  wound. Asked me to place Endoform on this. She told me she is not planning to do a subsequent skin graft on the right as the left one did not take very well. I had placed Hydrofera Blue on the right 03/13/16; continue to have a reasonably healthy wound on the right medial ankle. Down to 3 mm in terms of size. There is epithelialization here. The area on the left medial ankle is her skin graft site. I suppose the last week this looks somewhat better. She has an open area inferiorly however in the center there appears to be some viable tissue. There is a lot of surface callus and eschar that will eventually need to come off however none of this looked to be infected. Patient states that the is able to keep the dressing on for several days which is an improvement. 03/20/16 no major change in the circumference of either wound however on the left side the patient was at Dr. Eusebio Friendly office and they did a debridement of left wound. 50% of the wound seems to be epithelialized. I been using Endoform on the left Hydrofera Blue in the right 03/27/16; she arrives today with her wound is not looking as healthy as they did last week. The area on the right clearly has an adherent surface to this a very similar surface on the left. Unfortunately for this patient this is all too familiar problem. Clearly the Endoform is not working and will need to change that today that has some potential to help this surface. She does not tolerate debridement in this clinic very well. She is changing the dressing wants 04/03/16; patient arrives with the wounds looking somewhat better especially on the right. Dr. Migdalia Dk change the dressing to silver alginate when she saw her on Monday and also sold her some compression socks. The usefulness of the latter is really not clear and woman with severely draining wounds. 04/10/16; the patient is doing a bit of an experiment wearing the compression stockings that Dr. Migdalia Dk provided her to  her left leg and the out of legs based dressings that we provided to the right. 05/01/16; the patient is continuing to wear compression stockings Dr. Migdalia Dk provided her on the left that are apparently silver impregnated. She has been using Iodoflex to the right leg wound. Still a moderate amount of drainage, when she leaves here the wraps only last for 4 days. She has to change the stocking on the left leg every night 05/15/16; she is now using compression stockings bilaterally provided by  Dr. Marla Roe. She is wearing a nonadherent layer over the wounds so really I don't think there is anything specific being done to this now. She has some reduction on the left wound. The right is stable. I think all healing here is being done without a specific dressing 06/09/16; patient arrives here today with not much change in the wound certainly in diameter to large circular wounds over the medial aspect of her ankle bilaterally. Under the light of these services are certainly not viable for healing. There is no evidence of surrounding infection. She is wearing compression stockings with some sort of silver impregnation as prescribed by Dr. Marla Roe. She has a follow-up with her tomorrow. 06/30/16; no major change in the size or condition of her wounds. These are still probably covered with a nonviable surface. She is using only her purchase stockings. She did see Dr. Marla Roe who seemed to want to apply Dakin's solution to this I'm not extreme short what value this would be. I would suggest Iodoflex which she still has at home. 07/28/16; I follow Mrs. Peris episodically along with Dr. Marla Roe. 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 She has been applying a topical collagen ointment to both wounds with Adaptic. I don't think Dr. Marla Roe is planning to take her back to the OR. 08/19/16; I follow Mrs. Jeneen Rinks on a monthly basis along with Dr.  Marla Roe of plastic surgery. 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 I been following her for a number of years. At one point I was able to get the right medial malleolus wound to heal and had the left medial malleolus down to about half its current size however and I had to send her to plastic surgery for an operative debridement. Since then things have been stable to slightly improve the area on the right is slightly better one in the left about the same although there is much less adherent surface than I'm used to with this patient. She is using some form of liquid collagen gel that Dr. Marla Roe provided a Kerlix cover with the patient's own pressure stockings. She tells me that she has extreme pain in both ankles and along the lateral aspect of both feet. She has been unable to work for some period of time. She is telling me she is retiring at the beginning of April. She sees Dr. Doran Durand of orthopedics next week 09/22/16; patient has not seen Dr. Marla Roe since the last time she is here. I'm not really sure what she is using to the wounds other than bits and pieces of think she had left over including most recently Hydrofera Blue. She is using juxtalite stockings. She is having difficulty with her husband's recent illness "stroke". She is having to transport him to various doctors appointments. Dr. Marla Roe left her the option of a repeat debridement with ACEL however she has not been able to get the time to follow-up on this. She continues to have a fair amount of drainage out of these wounds with certainly precludes leaving dressings on all week 10/13/16; patient has not seen Dr. Marla Roe since she was last in our clinic. I'm not really sure what she is doing with the wounds, we did try to get her [facility] and I think she is actually using this most of the time. Because of drainage she states she has to change  this every second day  although this is an improvement from what she used to do. She went to see Dr. Doran Durand who did not think she had a muscular issue with regards to her feet, he referred her to a neurologist and I think the appointment is sometime in June. I changed her back to Iodoflex which she has used in the past but not recently. 11/03/16; the patient has been using Iodoflex although she ran out of this. Still claims that there is a lot of drainage although the wound does not look like this. No surrounding erythema. She has not been back to see Dr. Marla Roe 11/24/16; the patient has been using Iodoflex again but she ran out of it 2 or 3 days ago. There is no major change in the condition of either one of these wounds in fact they are larger and covered in a thick adherent surface slough/nonviable tissue especially on the left. She does not tolerate mechanical debridement in our clinic. Going back to see Dr. Marla Roe of plastic surgery for an operative debridement would seem reasonable. 12/15/16; the patient has not been back to see Dr. Marla Roe. She is been dealing with a series of illnesses and her husband which of monopolized her time. She is been using Sorbact which we largely supplied. She states the drainage is bad enough that it maximum she can go 2-3 days without changing the dressing 01/12/2017 -- the patient has not been back for about 4 weeks and has not seen Dr. Marla Roe not does she have any appointment pending. 01/23/17; patient has not seen Dr. Marla Roe even though I suggested this previously. She is using Santyl that was suggested last week by Dr. Con Memos this Cost her $16 through her insurance which is indeed surprising 02/12/17; continuing Santyl and the patient is changing this daily. A lot of drainage. She has not been back to see plastic surgery she is using an Ace wrap. Our intake nurse suggested wrap around stockings which would make a good reasonable  alternative 02/26/17; patient is been using Santyl and changing this daily due to drainage. She has not been to see plastic surgery she uses in April Ace wrap to control the edema. She did obtain extremitease stockings but stated that the edema in her leg was to big for these 03/20/17; patient is using Santyl and Anasept. Surfaces looked better today the area on the right is actually measuring a little smaller. She has states she has a lot of pain in her feet and ankles and is asking for a consult to pain control which I'll try to help her with through our case manager. 04/10/17; the patient arrives with better-looking wound surfaces and is slightly smaller wound on the left she is using a combination of Santyl and Anasept. She has an appointment or at least as started in the pain control center associated with [facility] 05/14/17; this is a patient who I followed for a prolonged period of time. 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 At one point I had this down to a much smaller wound on the left however these reopened and we've never been able to get these to heal. She has been using Santyl and Anasept gel although 2 weeks ago she ran out of the Anasept gel. She has a stable appearance of the wound. She is going to the wound [name] clinic at [facility]. They wanted do a nerve block/spinal block although she tells me she is reluctant to go forward with that. 05/21/17; this  is a patient I have followed for many years. 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 Chronic pain and deformity in her ankles as well. She is been to see plastic surgery as well as orthopedics. Using PolyMem AG most recently/Kerramax/ABDs and 2 layer compression. She has managed to keep this on and she is coming in for a nurse check to change the dressing on Tuesdays, we see her on Fridays 06/05/17; really quite a good looking surface and the area especially on the  right medial has contracted in terms of dimensions. Well granulated healthy-looking tissue on both sides. Even with an open curet there is nothing that even feels abnormal here. This is as good as I've seen this in quite some time. We have been using PolyMem AG and bringing her in for a nurse check 06/12/17; really quite good surface on both of these wounds. The right medial has contracted a bit left is not. We've been using PolyMem and AG and she is coming in for a nurse visit 06/19/17; we have been using PolyMem AG and bringing her in for a nurse check. Dimensions of her wounds are not better but the surfaces looked better bilaterally. She complained of bleeding last night and the left wound and increasing pain bilaterally. She states her wound pain is more neuropathic than just the wounds. There was some suggestion that this was radicular from her pain management doctor in talking to her it is really difficult to sort this out. 06/26/17; using PolyMem and AG and bringing her in for a nurse check as All of this and reasonably stable condition. Certainly not improved. The dimensions on the lateral part of the right leg look better but not really measuring better. The medial aspect on the left is about the same. 07/03/16; we have been using PolyMen AG and bringing her in for a nurse check to change the dressings as the wounds have drainage which precludes once weekly changing. We are using all secondary absorptive dressings.our intake nurse is brought up the idea of using a wound VAC/snap VAC on the wound to help with the drainage to see if this would result in some contraction. This is not a bad idea. The area on the right medial is actually looking smaller. Both wounds have a reasonable-looking surface. There is no evidence of cellulitis. The edema is well controlled 07/10/17; the patient was denied for a snap VAC by her insurance. The major issue with these wounds continues to be drainage. We are using  wicked PolyMem AG and she is coming in for a nurse visit to change this. The wounds are stable to slightly improved. The surface looks vibrant and the area on the right certainly has shrunk in size but very slowly 07/17/17; the patient still has large wounds on her bilateral medial malleoli. Surface of both of these wounds looks better. The dimensions seem to come and go but no consistent improvement. There is no epithelialization. We do not have options for advanced treatment products due to insurance issues. They did not approve of the wound VAC to help control the drainage. More recently we've been using PolyMem and AG wicked to allow drainage through. We have been bringing her in for a nurse visit to change this. We do not have a lot of options for wound [name] products and the home again due to insurance issues 07/24/17; the patient's wound actually looks somewhat better today. No drainage measurements are smaller still healthy-looking surface. We used silver  collagen under PolyMen started last week. We have been bringing her in for a dressing change 07/31/17; patient's wound surface continued to look better and I think there is visible change in the dimensions of the wound on the right. Rims of epithelialization. We have been using silver collagen under PolyMen and bringing her in for a dressing change. There appears to be less drainage although she is still in need of the dressing change 08/07/17. Patient's wound surface continues to look better on both sides and the area on the right is definitely smaller. We have been using silver collagen and PolyMen. She feels that the drainage has been it has been better. I asked her about her vascular status. She went to see Dr. Aleda Grana at [facility] and had some form of ablation. I don't have much detail on this. I haven't my notes from 2016 that she was not a candidate for any further ablation but I don't have any more information on this. We had  referred her to vein and vascular I don't think she ever went. He does not have a history of PAD although I don't have any information on this either. We don't even have ABIs in our record 08/14/17; we've been using silver collagen and PolyMen cover. And putting the patient and compression. She we are bringing her in as a nurse visit to change this because ofarge amount of drainage. We didn't the ABIs in clinic today since they had been done in many moons 1.2 bilaterally. She has been to see vein and vascular however this was at [facility] and she had ablation although I really don't have any information on this all seemed biking get a report. She is also been operatively debrided by plastic surgery and had a cell placed probably 8-12 months ago. This didn't have a major effect. We've been making some gains with current dressings 08/19/17-She is here in follow-up evaluation for bilateral medial malleoli ulcers. She continues to tolerate debridement very poorly. We will continue with recently changed topical treatment; if no significant improvement may consider switching to Iodosorb/Iodoflex. 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 She has been using silver collagen and PolyMem. I believe she has been used and tried on Iodoflex before. 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 These have been very refractory over time. We have been using silver collagen and PolyMen. She is coming in once a week for a doctor's and once a week for nurse visits. We are actually making some progress 09/18/17; the patient's wounds are smaller especially on the right medial. She arrives today to upset to consider even washing these off with Anasept which I think is been part of the  reason this is been closing. We've been using collagen covered in PolyMen otherwise. It is noted that she has a small area of folliculitis on the right medial calf that. As we are wrapping her legs I'll give her a short course of doxycycline to make sure this doesn't amount to anything. She is a long list of complaints today including imbalance, shortness of breath on exertion, inversion of her left ankle. With regards to the latter complaints she is been to see orthopedics and they offered her a tendon release surgery I believe but wanted her wounds to be  closed first. I have recommended she go see her primary physician with regards to everything else. 09/25/17; patient's wounds are about the same size. We have made some progress bilaterally although not in recent weeks. She will not allow me T wash these wounds with Anasept even if she is doing her cell. Wheeze we've been using collagen covered in PolyMen. Last week she had a small area of folliculitis this is now opened into a small wound. She completed 5 days of trimethoprim sulfamethoxazole 10/02/17; unfortunately the area on her left medial ankle is worse with a larger wound area towards the Achilles. The patient complains of a lot of pain. She will not allow debridement although visually I don't think there is anything to debridement in any case. We have been using silver collagen and PolyMen for several months now. Initially we are making some progress although I'm not really seeing that today. We will move back to [facility]. His admittedly this is a bit of a repeat however I'm hoping that his situation is different now. The patient tells me she had her leg on the left give out on her yesterday this is process some pain. 10/09/17; the patient is seen twice a week largely because of drainage issues coming out of the chronic medial bimalleolar wounds that are chronic. Last week the dimensions of the one on the left looks a little larger I  changed her to [facility]. She comes in today with a history of terrible pain in the bilateral wound areas. She will not allow debridement. She will not even allow a tissue culture. There is no surrounding erythema no no evidence of cellulitis. We have been putting her Kerlix Coban man. She will not allow more aggressive compression as there was a suggestion to put her in 3 layer wraps. 10/16/17; large wounds on her bilateral medial malleoli. These are chronic. Not much change from last week. The surface looks have healthy but absolutely no epithelialization. A lot of pain little less so of drainage. She will not allow debridement or even washing these off in the vigorous fashion with Anasept. 10/23/17; large wounds on her bilateral malleoli which are chronic. Some improvement in terms of size perhaps on the right since last time I saw these. She states that after we increased the 3 layer compression there was some bleeding, when she came in for a nurse visit she did not want 3 layer compression put back on about our nurse managed to convince her. She has known chronic venous visit issues and I'm hoping to get her to tolerate the 3 layer compression. using Hydrofera Blue 10/30/17; absolutely no change in the condition of either wound although we've had some improvement in dimensions on the right.. Attempted to put her in 3 layer compression she didn't tolerated she is back in 2 layer compression. We've been using Hydrofera Blue We looked over her past records. She had venous reflux studies in November 2016. There was no evidence of deep venous reflux on the right. Superficial vein did not show the greater saphenous vein at think this is been previously ablated the small saphenous vein was within normal limits. The left deep venous system showed no DVT the vessels were positive for deep venous reflux in the posterior tibial veins at the ankle. The greater saphenous vein was surgically absent small  saphenous vein was within normal limits. She went to vein and vascular at [facility]. I believe she had an ablation on the left greater saphenous  vein. I'll update her reflux studies perhaps ever reviewed by vein and vascular. We've made absolutely no progress in these wounds. Will also try to read and TheraSkins through her insurance 11/06/17; W the patient apparently has a 2 week follow-up with vein and vascular I like him to review the whole issue with regards to her previous vascular workup by Dr. Aleda Grana. We've really made no progress on these wounds in many months. She arrives today with less viable looking surface on the left medial ankle wound. This was apparently looking about the same on Tuesday when she was here for nurse visit. 11/13/17; deep tissue culture I did last time of the left lower leg showed multiple organisms without any predominating. In particular no Staphylococcus or group A strep were isolated. We sent her for venous reflux studies. She's had a previous left greater saphenous vein stripping and I think sclerotherapy of the right greater saphenous vein. She didn't really look at the lesser saphenous vein this both wounds are on the medial aspect. She has reflux in the common femoral vein and popliteal vein and an accessory vein on the right and the common femoral vein and popliteal vein on the left. I'm going to have her go to see vein and vascular just the look over things and see if anything else beside aggressive compression is indicated here. We have not been able to make any progress on these wounds in spite of the fact that the surface of the wounds is never look too bad. 11/20/17; no major change in the condition of the wounds. Patient reports a large amount of drainage. She has a lot of complaints of pain although enlisting her today I wonder if some of this at least his neuropathic rather than secondary to her wounds. She has an appointment with vein and vascular  on 12/30/17. The refractory nature of these wounds in my mind at least need vein and vascular to look over the wounds the recent reflux studies we did and her history to see if anything further can be done here. I also note her gait is deteriorated quite a bit. 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 She has a bilateral Trendelenburg gait. I wonder if this is neuropathic or perhaps multilevel radicular. 11/27/17; her wounds actually looks slightly better. Healthy-looking granulation tissue a scant amount of epithelialization. [place] healthcare will not pay for [employer]. They will play for tri layer Oasis and Dermagraft. This is not a diabetic ulcer. We'll try for the tri layer Oasis. She still complains of some drainage. She has a vein and vascular appointment on 12/30/17 12/04/17; the wounds visually look quite good. Healthy-looking granulation with some degree of epithelialization. We are still waiting for response to our request for trial to try layer Oasis. Her appointment with vascular to review venous and arterial issues isn't sold the end of July 7/31. Not allow debridement or even vigorous cleansing of the wound surface. 12/18/17; slightly smaller especially on the right. 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 We've been using Hydrofera Blue. We still are looking into triple layer Oasis through her insurance 01/08/18 on evaluation today patient's wound actually appears to be showing signs of good improvement at this point in time. She has been tolerating the dressing changes without complication. Fortunately there does not appear to be any evidence of infection at this point in time. We have been utilizing silver nitrate which does seem to be of benefit for her which is  also good news. Overall I'm very happy with how things seem to be both regards appearance as well as measurement. Patient did see Dr. Bridgett Larsson for evaluation on 12/30/17. In his assessment he  felt that stripping would not likely add much more than chronic compression to the patient's healing process. His recommendation was to follow-up in three months with Dr. Doren Custard if she hasn't healed in order to consider referral back to you and see vascular where she previously was in a trial and was able to get her wound to heal. I'll be see what she feels she when you staying compression and he reiterated this as well. 01/13/18 on evaluation today patient appears to actually be doing very well in regard to her bilateral medial malleolus ulcers. She seems to have tolerated the chemical cauterization with silver nitrate last week she did have some pain through that evening but fortunately states that I'll be see since it seems to be doing better she is overall pleased with the progress. 01/21/18; really quite a remarkable improvement since I've last seen these wounds. We started using silver nitrate specially on the islands of hyper granulation which for some reason her around the wound circumference. This is really done quite nicely. Primary dressing Hydrofera Blue under 4 layer compression. She seems to be able to hold out without a nurse rewrap. Follow-up in 1 week 01/28/18; we've continued the hydrofera blue but continued with chemical cauterization to the wound area that we started about a month ago for irregular hyper granulation. She is made almost stunning improvement in the overall wound dimensions. I was not really expecting this degree of improvement in these chronic wounds 02/05/18; we continue with Hydrofera Bluebut of also continued the aggressive chemical cauterization with silver nitrate. We made nice progress with the right greater than left wound. 02/12/18. We continued with Hydrofera Blue after aggressive chemical cauterization with silver nitrate. We appear to be making nice progress with both wound areas 02/19/2018; we continue with [facility] after washing the wounds vigorously  with Anasept spray and chemical cauterization with silver nitrate. We are making excellent progress. The area on the right's just about closed 02/26/2018. The area on the left medial ankle had too much necrotic debris today. I used a #5 curette we are able to get most of the soft. I continued with the silver nitrate to the much smaller wound on the right medial ankle she had a new area on her right lower pretibial area which she says was due to a role in her compression 03/05/2018; both wound areas look healthy. Not much change in dimensions from last week. I continue to use silver nitrate and Hydrofera Blue. The patient saw Dr. Doren Custard of vein and vascular. He felt she had venous stasis ulcers. He felt based on her previous arteriogram she should have adequate circulation for healing. Also she has deep venous reflux but really no significant correctable superficial venous reflux at this time. He felt we should continue with conservative management including leg elevation and compression 04/02/2018; since we last saw this woman about a month ago she had a fall apparently suffered a pelvic fracture. I did not look up the x-ray. Nevertheless because of pain she literally was bedbound for 2 weeks and had home health coming out to change the dressing. Somewhat predictably this is resulted in considerable improvement in both wound areas. The right is just about closed on the medial malleolus and the left is about half the size. 04/16/2018; both her  wounds continue to go down in size. Using Hydrofera Blue. 05/07/18; both her wounds appeared to be improving especially on the right where it is almost closed. We are using Hydrofera Blue 05/14/2018; slightly worse this week with larger wounds. Surface on the left medial not quite as good. We have been using Hydrofera Blue 05/21/18; again the wounds are slightly larger. Left medial malleolus slightly larger with eschar around the circumference. We have been using  Hydrofera Blue undergoing a wraps for a prolonged period of time. This got a lot better when she was more recumbent due to a fall and a back injury. I change the primary dressing the silver alginate today. She did not tolerate a 4 layer compression previously although I may need to bring this up with her next time 05/28/2018; area on the left medial malleolus again is slightly larger with more drainage. Area on the right is roughly unchanged. She has a small area of folliculitis on the right medial just on the lower calf. This does not look ominous. 06/03/2018 left medial malleolus slightly smaller in a better looking surface. We used silver nitrate on this last time with silver alginate. The area on the right appears slightly smaller 1/10; left medial malleolus slightly smaller. Small open area on the right. We used silver nitrate and silver alginate as of 2 weeks ago. We continue with the wound and compression. These got a lot better when she was off her feet 1/17; right medial malleolus wound is smaller. The left may be slightly smaller. Both surfaces look somewhat better. 1/24; both wounds are slightly smaller. Using silver alginate under Unna boots 1/31; both wounds appear smaller in fact the area on the right medial is just about closed. Surface eschar. We have been using silver alginate under Unna boots. The patient is less active now spends let much less time on her feet and I think this is contributed to the general improvement in the wound condition 2/7; both wounds appear smaller. I was hopeful the right medial would be closed however there there is still the same small open area. Slight amount of surface eschar on the left the dimensions are smaller there is eschar but the wound edges appear to be free. We have been using silver alginate under Unna boot's 2/14; both wounds once again measure smaller. Circumferential eschar on the left medial. We have been using silver alginate under Unna  boots with gradual improvement 2/21; the area on the right medial malleolus has healed. The area on the left is smaller. We have been using silver alginate and Unna boots. We can discharge wrapping the right leg she has 20/30 stockings at home she will need to protect the scar tissue in this area 2/28; the area on the right medial malleolus remains closed the patient has a compression stocking. The area on the left is smaller. We have been using silver alginate and Unna boots. 3/6 the area on the right medial ankle remains closed. Good edema control noted she is using her own compression stocking. The area on the left medial ankle is smaller. We have been managing this with silver alginate and Unna boots which we will continue today. 3/13; the area on the right medial ankle remains closed and I'm declaring it healed today. When necessary the left is about the same still a healthy-looking surface but no major change and wound area. No evidence of infection and using silver alginate under unna and generally making considerable improvement 3/27 the area on  the right medial ankle remains closed the area on the left is about the same as last week. Certainly not any worse we have been using silver alginate under an Unna boot 4/3; the area on the right medial ankle remains closed per the patient. We did not look at this wound. The wound on the left medial ankle is about the same surface looks healthy we have been using silver alginate under an Unna boot 4/10; area on the right medial ankle remains closed per the patient. We did not look at this wound. The wound on the left medial ankle is slightly larger. The patient complains that the [facility] caused burning pain all week. She also told us that she was a lot more active this week. Changed her back to silver alginate 4/17; right medial ankle still closed per the patient. Left medial ankle is slightly larger. Using silver alginate. She did not  tolerate Hydrofera Blue on this area 4/24; right medial ankle remains closed we have not look at this. The left medial ankle continues to get larger today by about a centimeter. We have been using silver alginate under Unna boots. She complains about 4 layer compression as an alternative. She has been up on her feet working on her garden 5/8; right medial ankle remains closed we did not look at this. The left medial ankle has increased in size about 100%. We have been using silver alginate under Unna boots. She noted increased pain this week and was not surprised that the wound is deteriorated 5/15; no major change in SA however much less erythema ( one week of doxy ocellulitis). 5/22-65 year old female returns at 1 week to the clinic for left medial ankle wound for which we have been using silver alginate under 3 layer compression She was placed on DOXY at last visit - the wound is wider at this visit. She is in 3 layer compression 5/29; change to [facility] last week. I had given her empiric doxycycline 2 weeks ago for a week. She is in 3 layer compression. She complains of a lot of pain and drainage on presentation today. 6/5; using Hydrofera Blue. I gave her doxycycline recently empirically for erythema and pain around the wound. Believe her cultures showed enterococcus which not would not have been well covered by doxycycline nevertheless the wound looks better and I don't feel specifically that the enterococcus needs to be covered. She has a new what looks like a wrap injury on her lateral left ankle. 6/12; she is using Hydrofera Blue. She has a new area on the left anterior lower tibial area. This was a wrap injury last week. 6/19; the patient is using Hydrofera Blue. She arrived with marked inflammation and erythema around the wound and tenderness. 12/01/18 on evaluation today patient appears to be doing a little bit better based on what I'm hearing from the standpoint of lassos evaluation  to this as far as the overall appearance of the wound is concerned. Then sometime substandard she typically sees Dr. Dellia Nims. Nonetheless overall very pleased with the progress that she's made up to this point. No fevers, chills, nausea, or vomiting noted at this time. 7/10; some improvement in the surface area. Aggressively debrided last week apparently. I went ahead with the debridement today although the patient does not tolerate this very well. We have been using Iodoflex. Still a fair amount of drainage 7/17; slightly smaller. Using Iodoflex. 7/24; no change from last week in terms of surface area. We have  been using Iodoflex. Surface looks and continues to look somewhat better 7/31; surface area slightly smaller better looking surface. We have been using Iodoflex. This is under Unna boot compression 8/7-Patient presents at 1 week with Unna boot and Iodoflex, wound appears better 8/14-Patient presents at 1 week with Iodoflex, we use the Unna boot, wound appears to be stable better.Patient is getting Botox treatment for the inversion of the foot for tendon release, Next week 8/21; we are using Iodoflex. Unna boot. The wound is stable in terms of surface area. Under illumination there is some areas of the wound that appear to be either epithelialized or perhaps this is adherent slough at this point I was not really clear. It did not wipe off and I was reluctant to debride this today. 8/28; we are using Iodoflex in an Unna boot. Seems to be making good improvement. 9/4; using Iodoflex and wound is slightly smaller. 9/18; we are using Iodoflex with topical silver nitrate when she is here. The wound continues to be smaller 10/2; patient missed her appointment last week due to GI issues. She left and Iodoflex based dressing on for 2 weeks. Wound is about the same size about the size of a dime on the left medial lower 10/9 we have been using Iodoflex on the medial left ankle wound. She has a new  superficial probable wrap injury on the dorsal left ankle 10/16; we have been using Hydrofera Blue since last week. This is on the left medial ankle 10/23; we have been using Hydrofera Blue since 2 weeks ago. This is on the left medial ankle. Dimensions are better 11/6; using Hydrofera Blue. I think the wound is smaller but still not closed. Left medial ankle 11/13; we have been using Hydrofera Blue. Wound is certainly no smaller this week. Also the surface not as good. This is the remanent of a very large area on her left medial ankle. 11/20; using Sorbact since last week. Wound was about the same in terms of size although I was disappointed about the surface debris 12/11; 3-week follow-up. Patient was on vacation. Wound is measuring slightly larger we have been using Sorbact. 12/18; wound is about the same size however surface looks better last week after debridement. We have been using Sorbact under compression 1/15 wound is probably twice the size of last time increased in length nonviable surface. We have been using Sorbact. She was running a mild fever and missed her appointment last week 1/22; the wound is come down in size but under illumination still a very adherent debris we have been Hydrofera Blue that I changed her to last week 1/29; dimensions down slightly. We have been using Hydrofera Blue 2/19 dimensions are the same however there is rims of epithelialization under illumination. Therefore more the surface area may be epithelialized 2/26; the patient's wound actually measures smaller. The wound looks healthy. We have been using Hydrofera Blue. I had some thoughts about running Apligraf then I still may do that however this looks so much better this week we will delay that for now 3/5; the wound is small but about the same as last week. We have been using Hydrofera Blue. No debridement is required today. 3/19; the wound is about the size of a dime. Healthy looking wound even under  illumination. We have been using Hydrofera Blue. No mechanical debridement is necessary 3/26; not much change from last week although still looks very healthy. We have been using Hydrofera Blue under Unna boots Patient was  offered an ankle fusion by podiatry but not until the wound heals with a proceed with this. 4/9; the patient comes in today with her original wound on the medial ankle looking satisfactory however she has some uncontrolled swelling in the middle part of her leg with 2 new open areas superiorly just lateral to the tibia. I think this was probably a wrap issue. She said she felt uncomfortable during the week but did not call in. We have been using Hydrofera Blue 4/16; the wound on the medial ankle is about the same. She has innumerable small areas superior to this across her mid tibia. I think this is probably folliculitis. She is also been working in the yard doing a lot of sweating 4/30; the patient issue on the upper areas across her mid tibia of all healed. I think this was excessive yard work if I remember. Her wound on the medial ankle is smaller. Some debris on this we have been using Hydrofera Blue under Unna boots 5/7; mid tibia. She has been using Hydrofera Blue under an Unna wrap. She is apparently going for her ankle surgery on June 3 10/28/19-Patient returns to clinic with the ankle wound, we are using Hydrofera Blue under Unna wrap, surgery is scheduled for her left foot for June 3 so she will be back for nurse visit next week READMISSION 01/17/2020 Mrs. Koper is a 65 year old woman we have had in this clinic for a long period of time with severe venous hypertension and refractory wounds on her medial lower legs and ankles bilaterally. 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 When she was off her legs for a prolonged period example when she fell  and suffered a compression fracture things would heal up quite nicely. She is now retired and we managed to heal up the right medial leg wound. The left one was very tiny last time I saw this although still refractory. 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 It got to the point where this was interfering with her walking and she elected to proceed with a ankle arthrodesis to straighten her her ankle and leave her with a functional outcome for mobilization. The patient was referred to Dr. Doren Custard and really this took some time to arrange. Dr. Doren Custard saw her on 12/07/2019. Once again he verified that she had no arterial issues. She had previously had an angiogram several years ago. Follow-up ABIs on the left showed an ABI of 1.12 with triphasic waveforms and a TBI of 0.92. She is felt to have chronic deep venous insufficiency but I do not think it was felt that anything could be done from about this from an ablation point of view. At the time Dr. Doren Custard saw this patient the wounds actually look closed via the pictures in his clinic. The patient finally underwent her surgery on 12/15/2019. This went reasonably well and there was a good anatomic outcome. She developed a small distal wound dehiscence on the lateral part of the surgical wound. However more problematically she is developed recurrence of the wound on the medial left ankle. There are actually 2 wounds here one in the distal lower leg and 1 pretty much at the level of the medial malleolus. It is a more distal area that is more problematic. She  has been using Hydrofera Blue which started on Friday before this she was simply Ace wrapping. There was a culture done that showed Pseudomonas and she is on ciprofloxacin. A recent CNS on 8/11 was negative. The patient reports some pain but I generally think this is improving. She is using a cam boot completely  nonweightbearing using a walker for pivot transfers and a wheelchair 8/24; not much improvement unfortunately she has a surgical wound on the lateral part in the venous insufficiency wound medially. The bottom part of the medial insufficiency wound is still necrotic there is exposed tendon here. We have been using Hydrofera Blue under compression. Her edema control is however better 8/31; patient in for follow-up of his surgical wound on the lateral part of her left leg and chronic venous insufficiency ulcers medially. We put her back in compression last week. She comes in today with a complaint of 3 or 4 days [name] of increasing pain. She felt her cam walker was rubbing on the area on the back of her heel. However there is intense erythema seems more likely she has cellulitis. She had 2 cultures done when she was seeing podiatry in the postop. One of them in late July showed Pseudomonas and she received a course of ciprofloxacin the other was negative on 8/11 she is allergic to penicillin with anaphylactoid complaints of hives oral swelling via information in epic 9/9; when I saw this patient last week she had intense anterior erythema around her wound on the right lateral heel and ankle and also into the right medial heel. Some of this was no doubt drainage and her walker boot however I was convinced she had cellulitis. I gave her Levaquin and Bactrim she is finishing up on this now. She is following up with Dr. [name] he saw her yesterday. He is taken her out of the walking boot of course she is still nonweightbearing. Her x-ray was negative for any worrisome features such as soft tissue air etc. Things are a lot better this week. She has home health. We have been using Hydrofera Blue under an [employer] which she put back on yesterday. I did not wrap her last week 9/17; her surrounding skin looks a lot better. In fact the area on the left lateral ankle has just a scant amount of eschar. The only  remaining wound is the large area on the left medial ankle. Probably about 60% of this is healthy granulation at the surface however she has a significant divot distally. This has adherent debris in it. I been using debridement and silver collagen to try and get this area to fill-in although I do not think we have made much progress this week 9/24; the patient's wound on the left medial ankle looks a lot better. The deeper divot area distally still requires debridement but this is cleaning up quite nicely we have been using silver collagen. The patient is complaining of swelling in her foot and is worried that that is contributing to the nonhealing of the ankle wound. She is also complaining of numbness in her anterior toes 10/4; left medial ankle. The small area distally still has a divot with necrotic material that I have been debriding away. This has an undermining area. She is approved for Apligraf. She saw Dr. [name] her surgeon on 10/1. I think he declared himself is satisfied with the condition of things. Still nonweightbearing till the end of the month. We are dealing with the venous insufficiency wounds on the  medial ankle. Her surgical wound is well closed. There is no evidence of infection 10/11; the patient arrived in clinic today with the expectation that we be able to put an Apligraf on this area after debridement however she arrives with a relatively large amount of green drainage on the dressing. The patient states that this started on Friday. She has not been systemically unwell. 10/19; culture I did last week showed both Enterococcus and Pseudomonas. I think this came in separate parts because I stopped her ciprofloxacin I gave her and prescribed her linezolid however now looking at the final culture result this is Pseudomonas which is resistant to quinolones. She has not yet picked up the linezolid apparently phone issues. We are also trying to get a topical antibiotic out of  [place] in [place] they can be applied by home health. She is still having green drainage 10/16; the patient has her topical antibiotic from [facility] in [place]. This is a compounded gel with vancomycin and ciprofloxacin and gentamicin. We are applying this on the wound bed with silver alginate over the top with Unna boot wraps. She arrives in clinic today with a lot less ominous looking drainage although she is only use this topical preparation once the second time today. She sees Dr. [name] her surgeon on Friday she has home health changing the dressing 11/2; still using her compounded topical antibiotic under silver alginate. Surface is cleaning up there is less drainage. We had an Apligraf for her today and I elected to apply it. A light coating of her antibiotic 04/25/2020 upon evaluation today patient appears to be doing well with regard to her ankle ulcer. There is a little bit of slough noted on the surface of the wound I am can have to perform sharp debridement to clear this away today. With that being said other than that fact overall I feel like she is making progress and we do see some new epithelial growth. There is also some improvement in the depth of the wound and that distal portion. There is little bit of slough there as well. 12/7; 2-week follow-up. Apligraf #3. Dimensions are smaller. Closing in especially inferiorly. Still some surface debris. Still using the [name] topical antibiotic but I told her that I don't think this needs to be renewed 12/21; 2-week follow-up. Apligraf #4 dimensions are smaller. Nice improvement 06/05/2020; 2-week follow-up. The patient's wound on the left medial ankle looks really excellent. Nice granulation. Advancing epithelialization no undermining no evidence of infection. We would have to reapply for another Apligraf but with the condition of this wound I did not feel strongly about it. We used Hydrofera Blue under the same degree  of compression. She follows up with Dr. [name] her surgeon a week Friday 06/13/2020 upon evaluation today patient appears to be doing excellent in regard to her wound. She has been tolerating the dressing changes without complication. Fortunately there is no signs of active infection at this time. No fevers, chills, nausea, vomiting, or diarrhea. She was using Hydrofera Blue last week. 06/20/2020 06/20/2020 on evaluation today patient actually appears to be doing excellent in regard to her wound. This is measuring better and looking much better as well. She has been using the collagen that seems to be doing better for her as well even though the [facility] was and is not sticking or feeling as rough on her wound. She did see Dr. [name] on Friday he is very pleased he also stated none of the hardware  has shifted. That is great news 1/27; the patient has a small clean wound all that is remaining. I agree that this is too small to really consider an Apligraf. Under illumination the surface is looking quite good. We have been using collagen although the dimensions are not any better this week 2/2; the patient has a small clean wound on the left medial ankle. Although this left of her substantial original areas. Measurements are smaller. We have been using polymen Ag under an Haematologist. 2/10; small area on the left medial ankle. This looks clean nothing to debride however dimensions are about the same we have been using polymen I think now for 2 weeks 2/17; not much change in surface area. We have been using polymen Ag without any improvement. 3/17; 1 month follow-up. The patient has been using endoform without any improvement in fact I think this looks worse with more depth and more expansion 3/24; no improvement. Perhaps less debris on the surface. We have been using Sorbact for 1 week 4/4; wound measures larger. She has edema in her leg and her foot which she tells as her wrap came down. We have been  using Unna boots. Sorbact of the wound. She has been approved for Apligraf 09/12/2020 upon evaluation today patient appears to be doing well with regard to her wound. We did get the Apligraf reapproved which is great news we have that available for application today. Fortunately there is no signs of infection and overall the patient seems to be doing great. The wound bed is nice and clean. Electronic Signature(s) Signed: 09/12/2020 11:25:18 AM By: [name] Keeler PA-C Entered By: [name] on 09/12/2020 11:25:18 -------------------------------------------------------------------------------- Physical Exam Details Patient Name: Date of Service: [facility] [name], [name] 09/12/2020 10:00 A M Medical Record Number: [id number] Patient Account Number: [id number] Date of Birth/Sex: Treating RN: 1955/10/13 (65 y.o. [name]: [name] Other Clinician: Referring [name]: Treating [name]/Extender: [name] in Treatment: 68 Constitutional Well-nourished and well-hydrated in no acute distress. Respiratory normal breathing without difficulty. Psychiatric this patient is able to make decisions and demonstrates good insight into disease process. Alert and Oriented x 3. pleasant and cooperative. Notes Patient's wound bed showed signs of good granulation epithelization at this point. There does not appear to be any signs of infection and overall I think she is definitely ready for reapplication of Apligraf today. Overall I am extremely pleased with where things stand at this point. We did reapply the Apligraf today and secured with Adaptic and Steri-Strips the patient tolerated all that without complication. Electronic Signature(s) Signed: 09/12/2020 11:26:49 AM By: [name] Keeler PA-C Entered By: [name] on 09/12/2020 11:26:49 -------------------------------------------------------------------------------- Physician Orders  Details Patient Name: Date of Service: [name], [name] 09/12/2020 10:00 A M Medical Record Number: [id number] Patient Account Number: [id number] Date of Birth/Sex: Treating RN: 1956/01/17 (65 y.o. [name]: [name] Other Clinician: Referring [name]: Treating [name]/Extender: [name] in Treatment: 71 Verbal / Phone Orders: No Diagnosis Coding ICD-10 Coding Code Description [phone] Chronic venous hypertension (idiopathic) with ulcer and inflammation of left lower extremity L97.828 Non-pressure chronic ulcer of other part of left lower leg with other specified severity L97.328 Non-pressure chronic ulcer of left ankle with other specified severity Follow-up Appointments ppointment in 2 weeks. - MD for next apligraf Return A Nurse Visit: - 1 week post apligraf Cellular or Tissue  Based Products daptic or Mepitel. (DO NOT REMOVE). - Cellular or Tissue Based Product applied to wound bed, secured with steri-strips, cover with A Apligraf #1 Bathing/ Shower/ Hygiene May shower with protection but do not get wound dressing(s) wet. Edema Control - Lymphedema / SCD / Other Elevate legs to the level of the heart or above for 30 minutes daily and/or when sitting, a frequency of: - throughout the day Avoid standing for long periods of time. Exercise regularly Compression stocking or Garment 20-30 mm/Hg pressure to: - right leg daily Wound Treatment Wound #15 - Malleolus Wound Laterality: Left, Medial Cleanser: Soap and Water 1 x Per Week Discharge Instructions: May shower and wash wound with dial antibacterial soap and water prior to dressing change. Peri-Wound [name]: Sween Lotion (Moisturizing lotion) 1 x Per Week Discharge Instructions: Apply moisturizing lotion as directed Secondary Dressing: Woven Gauze Sponge, Non-Sterile 4x4 in 1 x Per Week Discharge Instructions: Apply over primary dressing as  directed. Secondary Dressing: ABD Pad, 5x9 1 x Per Week Discharge Instructions: Apply over primary dressing as directed. Compression Wrap: Unnaboot w/Calamine, 4x10 (in/yd) 1 x Per Week Discharge Instructions: Apply Unnaboot as directed. Electronic Signature(s) Signed: 09/12/2020 5:03:59 PM By: [name] Keeler PA-C Signed: 09/12/2020 5:38:32 PM By: [name] Gouty RN, BSN Entered By: [name] on 09/12/2020 11:21:26 -------------------------------------------------------------------------------- Problem List Details Patient Name: Date of Service: [name], [name] 09/12/2020 10:00 A M Medical Record Number: [id number] Patient Account Number: [id number] Date of Birth/Sex: Treating RN: Mar 22, 1956 (65 y.o. 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 01/17/2020 No Yes Inactive Problems ICD-10 Code Description Active Date Inactive Date L03.116 Cellulitis of left lower limb 01/31/2020 01/31/2020 T81.31XD Disruption of external operation (surgical) wound, not elsewhere classified, subsequent 01/17/2020 01/17/2020 encounter Resolved Problems Electronic Signature(s) Signed: 09/12/2020 10:47:18 AM By: [name] Keeler PA-C Entered By: [name] on 09/12/2020 10:47:18 -------------------------------------------------------------------------------- Progress Note Details Patient Name: Date of Service: [facility] [name], [name] 09/12/2020 10:00 A M Medical Record Number:  [id number] Patient Account Number: [id number] Date of Birth/Sex: Treating RN: 1955/12/07 (65 y.o. [name]: [name] Other Clinician: Referring [name]: Treating [name]/Extender: [name] in Treatment: 85 Subjective Chief Complaint Information obtained from Patient patient is been followed long-term in this clinic for venous insufficiency ulcers with inflammation, hypertension and ulceration over the medial ankle bilaterally. 01/17/2020; this is a patient who is here for review of postoperative wounds on the left lateral ankle and recurrence of venous stasis ulceration on the left medial History of Present Illness (HPI) the remaining wound is over the left medial ankle. Similar wound over the right medial ankle healed largely with use of Apligraf. Most recently we have been using Hydrofera Blue over this wound with considerable improvement. The patient has been extensively worked up in the past for her venous insufficiency and she is not a candidate for antireflux surgery although I have none of the details available currently. 08/24/14; considerable improvement today. About 50% of this wound areas now epithelialized. The base of the wound  appears to be healthier granulation.as opposed to last week when she had deteriorated a considerable improvement 08/17/14; unfortunately the wound has regressed somewhat. The areas of epithelialization from the superior aspect are not nearly as healthy as they were last week. The patient thinks her Hydrofera Blue slipped. 09/07/14; unfortunately the area has markedly regressed in the 2 weeks since I've seen this. There is an [name] surrounding erythema. The healthy granulation tissue that we had at the base of the wound now is a dusky color. The nurse reports green drainage 09/14/14; the area looks somewhat better than last week. There is less erythema and less drainage. The culture I did did not  show any growth. Nevertheless I think it is better to continue the Cipro and doxycycline for a further week. The remaining wound area was debridement. 09/21/14. Wound did not require debridement last week. Still less erythema and less drainage. She can complete her antibiotics. The areas of epithelialization in the superior aspect of the wound do not look as healthy as they did some weeks ago 10/05/14 continued improvement in the condition of this wound. There is advancing epithelialization. Less aggressive debridement required 10/19/14 continued improvement in the condition and volume of this wound. Less aggressive debridement to the inferior part of this to remove surface slough and fibrinous eschar 11/02/14 no debridement is required. The surface granulation appears healthy although some of her islands of epithelialization seem to have regressed. No evidence of infection 11/16/14; lites surface debridement done of surface eschar. The wound does not look to be unhealthy. No evidence of infection. Unfortunately the patient has had podiatry issues in the right foot and for some reason has redeveloped small surface ulcerations in the medial right ankle. Her original presentation involved wounds in this area 11/23/14 no debridement. The area on the right ankle has enlarged. The left ankle wound appears stable in terms of the surface although there is periwound inflammation. There has been regression in the amount of new skin 11/30/14 no debridement. Both wound areas appear healthy. There was no evidence of infection. The the new area on the right medial ankle has enlarged although that both the surfaces appear to be stable. 12/07/14; Debridement of the right medial ankle wound. No no debridement was done on the left. 12/14/14 no major change in and now bilateral medial ankle wounds. Both of these are very painful but the no overt evidence of infection. She has had previous venous ablation 12/21/14; patient  states that her right medial ankle wound is considerably more painful last week than usual. Her left is also somewhat painful. She could not tolerate debridement. The right medial ankle wound has fibrinous surface eschar 12/28/14 this is a patient with severe bilateral venous insufficiency ulcers. For a considerable period of time we actually had the one on the right medial ankle healed however this recently opened up again in June. The left medial ankle wound has been a refractory area with some absent flows. We had some success with Hydrofera Blue on this area and it literally closed by 50% however it is recently opened up Foley. Both of these were debridement today of surface eschar. She tolerates this poorly 01/25/15: No change in the status of this. Thick adherent escar. Very poor tolerance of any attempt at debridement. I had healed the right medial malleolus wound for a considerable amount of time and had the left one down to about 50% of the volume although this is totally regressed over the last 48 weeks. Further  the right leg has reopened. she is trying to make a appointment with pain and vascular, previous ablations with Dr. Aleda Grana. I do not believe there is an arterial insufficiency issue here 02/01/15 the status of the adherent eschar bilaterally is actually improved. No debridement was done. She did not manage to get vascular studies done 02/08/15 continued debridement of the area was done today. The slough is less adherent and comes off with less pressure. There is no surrounding infection peripheral pulses are intact 02/15/15 selective debridement with a disposable curette. Again the slough is less adherent and comes off with less difficulty. No surrounding infection peripheral pulses are intact. 02/22/15 selective debridement of the right medial ankle wound. Slough comes off with less difficulty. No obvious surrounding infection peripheral pulses are intact I did not debridement the  one on the left. Both of these are stable to improved 03/01/15 selective debridement of both wound areas using a curette to. Adherent slough cup soft with less difficulty. No obvious surrounding infection. The patient tells me that 2 days ago she noted a rash above the right leg wrap. She did not have this on her lower legs when she change this over she arrives with widespread left greater than right almost folliculitis-looking rash which is extremely pruritic. I don't see anything to culture here. There is no rash on the rest of her body. She feels well systemically. 03/08/15; selective debridement of both wounds using a curette. Base of this does not look unhealthy. She had limegreen drainage coming out of the left leg wound and describes a lot of drainage. The rash on her left leg looks improved to. No cultures were done. 03/22/15; patient was not here last week. Basal wounds does not look healthy and there is no surrounding erythema. No drainage. There is still a rash on the left leg that almost looks vasculitic however it is clearly limited to the top of where the wrap would be. 04/05/15; on the right required a surgical debridement of surface eschar and necrotic subcutaneous tissue. I did not debridement the area on the left. These continue to be large open wounds that are not changing that much. We were successful at one point in healing the area on the right, and at the same time the area on the left was roughly half the size of current measurements. I think a lot of the deterioration has to do with the prolonged time the patient is on her feet at work 04/19/15 I attempted-like surface debridement bilaterally she does not tolerate this. She tells me that she was in allergic [name] yesterday with extreme pain over her left lateral malleolus/ankle and was told that she has an "sprain" 05/03/15; large bilateral venous insufficiency wounds over the medial malleolus/medial aspect of her ankles. She  complains of copious amounts of drainage and his usual large amounts of pain. There is some increasing erythema around the wound on the right extending into the medial aspect of her foot to. historically she came in with these wounds the right one healed and the left one came down to roughly half its current size however the right one is reopened and the left is expanded. 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 05/10/15 large bilateral venous insufficiency wounds. There is less adherence surface left however the surface culture that I did last week grew pseudomonas therefore bilateral selective debridement score necessary. There is surrounding erythema. The patient  describes severe bilateral drainage and a lot of pain in the left ankle. Apparently her podiatrist was were ready to do a cortisone shot 05/17/15; the patient complains of pain and again copious amounts of drainage. 05/24/15; we used Iodo flex last week. Patient notes considerable improvement in wound drainage. Only needed to change this once. 05/31/15; we continued Iodoflex; the base of these large wounds bilaterally is not too bad but there is probably likely a significant bioburden here. I would like to debridement just doesn't tolerate it. 06/06/14 I would like to continue the Iodoflex although she still hasn't managed to obtain supplies. She has bilateral medial malleoli or large wounds which are mostly superficial. Both of them are covered circumferentially with some nonviable fibrinous slough although she tolerates debridement very poorly. She apparently has an appointment for an ablation on the right leg by interventional radiology. 06/14/15; the patient arrives with the wounds and static condition. We attempted a debridement although she does not do well with this secondary to pain. I 07/05/15; wounds are not much smaller however there appears to be a cleaner granulating base. The  left has tight fibrinous slough greater than the right. Debridement is tolerated poorly due to pain. Iodoflex is done more for these wounds in any of the multitude of different dressings I have tried on the left 1 and then subsequently the right. 07/12/15; no change in the condition of this wound. I am able to do an aggressive debridement on the right but not the left. She simply cannot tolerate it. We have been using Iodoflex which helps somewhat. It is worthwhile remembering that at one point we healed the right medial ankle wound and the left was about 25% of the current circumference. We have suggested returning to vascular surgery for review of possible further ablations for one reason or another she has not been able to do this. 07/26/15 no major change in the condition of either wound on her medial ankle. I did not attempt to debridement of these. She has been aggressively scrubbing these while she is in the shower at home. She has her supply of Iodoflex which seems to have done more for these wounds then anything I have put on recently. 08/09/15 wound area appears larger although not verified by measurements. Using Iodoflex 09/05/2015 -- she was here for avisit today but had significant problems with the wound and I was asked to see her for a physician opinion. I have summarize that this lady has had surgery on her left lower extremity about 10 years ago where the possible veins stripping was done. She has had an opinion from interventional radiology around November 2016 where no further sclerotherapy was ordered. The patient works 12 hours a day and stands on a concrete floor with work boots and is unable to get the proper compression she requires and cannot elevate her limbs appropriately at any given time. She has recently grown Pseudomonas from her wound culture but has not started her ciprofloxacin which was called in for her. 09/13/15 this continues to be a difficult situation for this  patient. At one point I had this wound down to a 1.5 x 1.5" wound on her left leg. This is deteriorated and the right leg has reopened. She now has substantial wounds on her medial calcaneus, malleoli and into her lower leg. One on the left has surface eschar but these are far too painful for me to debridement here. She has a vascular surgery appointment next week to see  if anything can be done to help here. I think she has had previous ablations several years ago at [facility]. She has no major edema. She tells me that she did not get product last time [facility]] and went for several days without it. She continues to work in work boots 12 hours a day. She cannot get compression/4-layer under her work boots. 09/20/15 no major change. Periwound edema control was not very good. Her point with pain and vascular is next Wednesday the 25th 09/28/15; the patient is seen vascular surgery and is apparently scheduled for repeat duplex ultrasounds of her bilateral lower legs next week. 10/05/15; the patient was seen by Dr. Doren Custard of vascular surgery. He feels that she should have arterial insufficiency excluded as cause/contributed to her nonhealing stage she is therefore booked for an arteriogram. She has apparently monophasic signals in the dorsalis pedis pulses. She also of course has known severe chronic venous insufficiency with previous procedures as noted previously. I had another long discussion with the patient today about her continuing to work 12 hour shifts. I've written her out for 2 months area had concerns about this as her work location is currently undergoing significant turmoil and this may lead to her termination. She is aware of this however I agree with her that she simply cannot continue to stand for 12 hours multiple days a week with the substantial wound areas she has. 10/19/15; the Dr. Doren Custard appointment was largely for an arteriogram which was normal. She does not have an arterial issue.  He didn't make a comment about her chronic venous insufficiency for which she has had previous ablations. Presumably it was not felt that anything additional could be done. The patient is now out of work as I prescribed 2 weeks ago. Her wounds look somewhat less aggravated presumably because of this. I felt I would give debridement another try today 10/25/15; no major change in this patient's wounds. We are struggling to get her product that she can afford into her own home through her insurance. 11/01/15; no major change in the patient's wounds. I have been using silver alginate as the most affordable product. I spoke to Dr. Marla Roe last week with her requested take her to the OR for surgical debridement and placement of ACEL. Dr. Marla Roe told me that she would be willing to do this however [facility] will not cover this, fortunately the patient has [place] healthcare of some variant 11/08/15; no major change in the patient's wounds. She has been completely nonviable surface that this but is in too much pain with any attempted debridement are clinic. I have arranged for her to see Dr. Marla Roe ham of plastic surgery and this appointment is on Monday. I am hopeful that they will take her to the OR for debridement, possible ACEL ultimately possible skin graft 11/22/15 no major change in the patient's wounds over her bilateral medial calcaneus medial malleolus into the lower legs. Surface on these does not look too bad however on the left there is surrounding erythema and tenderness. This may be cellulitis or could him sleepy tinea. 11/29/15; no major changes in the patient's wounds over her bilateral medial malleolus. There is no infection here and I don't think any additional antibiotics are necessary. There is now plan to move forward. She sees Dr. Marla Roe in a week's time for preparation for operative debridement and ACEL placement I believe on 7/12. She then has a follow-up  appointment with Dr. Marla Roe on 7/21  12/28/15; the patient returns today having been taken to the [facility] by Dr. Marla Roe 12/12/15 she underwent debridement, intraoperative cultures [which were negative]. She had placement of a wound VAC. Parent really ACEL was not available to be placed. The wound VAC foam apparently adhered to the wound since then she's been using silver alginate, Xeroform under Ace wraps. She still says there is a lot of drainage and a lot of pain 01/31/16; this is a patient I see monthly. I had referred her to Dr. Marla Roe him of plastic surgery for large wounds on her bilateral medial ankles. She has been to the OR twice once in early July and once in early August. She tells me over the last 3 weeks she has been using the wound VAC with ACEL underneath it. On the right we've simply been using silver alginate. Under Kerlix Coban wraps. 02/28/16; this is a patient I'm currently seeing monthly. She is gone on to have a skin graft over her large venous insufficiency ulcer on the left medial ankle. This was done by Dr. Marla Roe him. The patient is a bit perturbed about why she didn't have one on her right medial ankle wound. She has been using silver alginate to this. 03/06/16; I received a phone call from her plastic surgery Dr. Marla Roe. She expressed some concern about the viability of the skin graft she did on the left medial ankle wound. Asked me to place Endoform on this. She told me she is not planning to do a subsequent skin graft on the right as the left one did not take very well. I had placed Hydrofera Blue on the right 03/13/16; continue to have a reasonably healthy wound on the right medial ankle. Down to 3 mm in terms of size. There is epithelialization here. The area on the left medial ankle is her skin graft site. I suppose the last week this looks somewhat better. She has an open area inferiorly however in the center there appears to be some viable tissue. There is a  lot of surface callus and eschar that will eventually need to come off however none of this looked to be infected. Patient states that the is able to keep the dressing on for several days which is an improvement. 03/20/16 no major change in the circumference of either wound however on the left side the patient was at Dr. Eusebio Friendly office and they did a debridement of left wound. 50% of the wound seems to be epithelialized. I been using Endoform on the left Hydrofera Blue in the right 03/27/16; she arrives today with her wound is not looking as healthy as they did last week. The area on the right clearly has an adherent surface to this a very similar surface on the left. Unfortunately for this patient this is all too familiar problem. Clearly the Endoform is not working and will need to change that today that has some potential to help this surface. She does not tolerate debridement in this clinic very well. She is changing the dressing wants 04/03/16; patient arrives with the wounds looking somewhat better especially on the right. Dr. Migdalia Dk change the dressing to silver alginate when she saw her on Monday and also sold her some compression socks. The usefulness of the latter is really not clear and woman with severely draining wounds. 04/10/16; the patient is doing a bit of an experiment wearing the compression stockings that Dr. Migdalia Dk provided her to her left leg and the out of legs based dressings that  we provided to the right. 05/01/16; the patient is continuing to wear compression stockings Dr. Migdalia Dk provided her on the left that are apparently silver impregnated. She has been using Iodoflex to the right leg wound. Still a moderate amount of drainage, when she leaves here the wraps only last for 4 days. She has to change the stocking on the left leg every night 05/15/16; she is now using compression stockings bilaterally provided by Dr. Marla Roe. She is wearing a nonadherent layer over the  wounds so really I don't think there is anything specific being done to this now. She has some reduction on the left wound. The right is stable. I think all healing here is being done without a specific dressing 06/09/16; patient arrives here today with not much change in the wound certainly in diameter to large circular wounds over the medial aspect of her ankle bilaterally. Under the light of these services are certainly not viable for healing. There is no evidence of surrounding infection. She is wearing compression stockings with some sort of silver impregnation as prescribed by Dr. Marla Roe. She has a follow-up with her tomorrow. 06/30/16; no major change in the size or condition of her wounds. These are still probably covered with a nonviable surface. She is using only her purchase stockings. She did see Dr. Marla Roe who seemed to want to apply Dakin's solution to this I'm not extreme short what value this would be. I would suggest Iodoflex which she still has at home. 07/28/16; I follow Mrs. Mileto episodically along with Dr. Marla Roe. 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 She has been applying a topical collagen ointment to both wounds with Adaptic. I don't think Dr. Marla Roe is planning to take her back to the OR. 08/19/16; I follow Mrs. Jeneen Rinks on a monthly basis along with Dr. Marla Roe of plastic surgery. 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 I been following her for a number of years. At one point I was able to get the right medial malleolus wound to heal and had the left medial malleolus down to about half its current size however and I had to send her to plastic surgery for an operative debridement. Since then things have been stable to slightly improve the area on the right is slightly better one in the left about the same although there is much  less adherent surface than I'm used to with this patient. She is using some form of liquid collagen gel that Dr. Marla Roe provided a Kerlix cover with the patient's own pressure stockings. She tells me that she has extreme pain in both ankles and along the lateral aspect of both feet. She has been unable to work for some period of time. She is telling me she is retiring at the beginning of April. She sees Dr. Doran Durand of orthopedics next week 09/22/16; patient has not seen Dr. Marla Roe since the last time she is here. I'm not really sure what she is using to the wounds other than bits and pieces of think she had left over including most recently Hydrofera Blue. She is using juxtalite stockings. She is having difficulty with her husband's recent illness "stroke". She is having to transport him to various doctors appointments. Dr. Marla Roe left her the option of a repeat debridement with ACEL however she has not been able to get the time to follow-up on this. She continues to have  a fair amount of drainage out of these wounds with certainly precludes leaving dressings on all week 10/13/16; patient has not seen Dr. Marla Roe since she was last in our clinic. I'm not really sure what she is doing with the wounds, we did try to get her [facility] and I think she is actually using this most of the time. Because of drainage she states she has to change this every second day although this is an improvement from what she used to do. She went to see Dr. Doran Durand who did not think she had a muscular issue with regards to her feet, he referred her to a neurologist and I think the appointment is sometime in June. I changed her back to Iodoflex which she has used in the past but not recently. 11/03/16; the patient has been using Iodoflex although she ran out of this. Still claims that there is a lot of drainage although the wound does not look like this. No surrounding erythema. She has not been back to see Dr.  Marla Roe 11/24/16; the patient has been using Iodoflex again but she ran out of it 2 or 3 days ago. There is no major change in the condition of either one of these wounds in fact they are larger and covered in a thick adherent surface slough/nonviable tissue especially on the left. She does not tolerate mechanical debridement in our clinic. Going back to see Dr. Marla Roe of plastic surgery for an operative debridement would seem reasonable. 12/15/16; the patient has not been back to see Dr. Marla Roe. She is been dealing with a series of illnesses and her husband which of monopolized her time. She is been using Sorbact which we largely supplied. She states the drainage is bad enough that it maximum she can go 2-3 days without changing the dressing 01/12/2017 -- the patient has not been back for about 4 weeks and has not seen Dr. Marla Roe not does she have any appointment pending. 01/23/17; patient has not seen Dr. Marla Roe even though I suggested this previously. She is using Santyl that was suggested last week by Dr. Con Memos this Cost her $16 through her insurance which is indeed surprising 02/12/17; continuing Santyl and the patient is changing this daily. A lot of drainage. She has not been back to see plastic surgery she is using an Ace wrap. Our intake nurse suggested wrap around stockings which would make a good reasonable alternative 02/26/17; patient is been using Santyl and changing this daily due to drainage. She has not been to see plastic surgery she uses in April Ace wrap to control the edema. She did obtain extremitease stockings but stated that the edema in her leg was to big for these 03/20/17; patient is using Santyl and Anasept. Surfaces looked better today the area on the right is actually measuring a little smaller. She has states she has a lot of pain in her feet and ankles and is asking for a consult to pain control which I'll try to help her with through our case  manager. 04/10/17; the patient arrives with better-looking wound surfaces and is slightly smaller wound on the left she is using a combination of Santyl and Anasept. She has an appointment or at least as started in the pain control center associated with [facility] 05/14/17; this is a patient who I followed for a prolonged period of time. 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 At one point I had this down to a much smaller  wound on the left however these reopened and we've never been able to get these to heal. She has been using Santyl and Anasept gel although 2 weeks ago she ran out of the Anasept gel. She has a stable appearance of the wound. She is going to the wound [name] clinic at [facility]. They wanted do a nerve block/spinal block although she tells me she is reluctant to go forward with that. 05/21/17; this is a patient I have followed for many years. 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 Chronic pain and deformity in her ankles as well. She is been to see plastic surgery as well as orthopedics. Using PolyMem AG most recently/Kerramax/ABDs and 2 layer compression. She has managed to keep this on and she is coming in for a nurse check to change the dressing on Tuesdays, we see her on Fridays 06/05/17; really quite a good looking surface and the area especially on the right medial has contracted in terms of dimensions. Well granulated healthy-looking tissue on both sides. Even with an open curet there is nothing that even feels abnormal here. This is as good as I've seen this in quite some time. We have been using PolyMem AG and bringing her in for a nurse check 06/12/17; really quite good surface on both of these wounds. The right medial has contracted a bit left is not. We've been using PolyMem and AG and she is coming in for a nurse visit 06/19/17; we have been using PolyMem AG and bringing her in for a nurse check.  Dimensions of her wounds are not better but the surfaces looked better bilaterally. She complained of bleeding last night and the left wound and increasing pain bilaterally. She states her wound pain is more neuropathic than just the wounds. There was some suggestion that this was radicular from her pain management doctor in talking to her it is really difficult to sort this out. 06/26/17; using PolyMem and AG and bringing her in for a nurse check as All of this and reasonably stable condition. Certainly not improved. The dimensions on the lateral part of the right leg look better but not really measuring better. The medial aspect on the left is about the same. 07/03/16; we have been using PolyMen AG and bringing her in for a nurse check to change the dressings as the wounds have drainage which precludes once weekly changing. We are using all secondary absorptive dressings.our intake nurse is brought up the idea of using a wound VAC/snap VAC on the wound to help with the drainage to see if this would result in some contraction. This is not a bad idea. The area on the right medial is actually looking smaller. Both wounds have a reasonable-looking surface. There is no evidence of cellulitis. The edema is well controlled 07/10/17; the patient was denied for a snap VAC by her insurance. The major issue with these wounds continues to be drainage. We are using wicked PolyMem AG and she is coming in for a nurse visit to change this. The wounds are stable to slightly improved. The surface looks vibrant and the area on the right certainly has shrunk in size but very slowly 07/17/17; the patient still has large wounds on her bilateral medial malleoli. Surface of both of these wounds looks better. The dimensions seem to come and go but no consistent improvement. There is no epithelialization. We do not have options for advanced treatment products due to insurance issues. They did not approve  of the wound VAC to help  control the drainage. More recently we've been using PolyMem and AG wicked to allow drainage through. We have been bringing her in for a nurse visit to change this. We do not have a lot of options for wound [name] products and the home again due to insurance issues 07/24/17; the patient's wound actually looks somewhat better today. No drainage measurements are smaller still healthy-looking surface. We used silver collagen under PolyMen started last week. We have been bringing her in for a dressing change 07/31/17; patient's wound surface continued to look better and I think there is visible change in the dimensions of the wound on the right. Rims of epithelialization. We have been using silver collagen under PolyMen and bringing her in for a dressing change. There appears to be less drainage although she is still in need of the dressing change 08/07/17. Patient's wound surface continues to look better on both sides and the area on the right is definitely smaller. We have been using silver collagen and PolyMen. She feels that the drainage has been it has been better. I asked her about her vascular status. She went to see Dr. Aleda Grana at [facility] and had some form of ablation. I don't have much detail on this. I haven't my notes from 2016 that she was not a candidate for any further ablation but I don't have any more information on this. We had referred her to vein and vascular I don't think she ever went. He does not have a history of PAD although I don't have any information on this either. We don't even have ABIs in our record 08/14/17; we've been using silver collagen and PolyMen cover. And putting the patient and compression. She we are bringing her in as a nurse visit to change this because ofarge amount of drainage. We didn't the ABIs in clinic today since they had been done in many moons 1.2 bilaterally. She has been to see vein and vascular however this was at [facility] and she had ablation  although I really don't have any information on this all seemed biking get a report. She is also been operatively debrided by plastic surgery and had a cell placed probably 8-12 months ago. This didn't have a major effect. We've been making some gains with current dressings 08/19/17-She is here in follow-up evaluation for bilateral medial malleoli ulcers. She continues to tolerate debridement very poorly. We will continue with recently changed topical treatment; if no significant improvement may consider switching to Iodosorb/Iodoflex. 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 She has been using silver collagen and PolyMem. I believe she has been used and tried on Iodoflex before. 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 These have been very refractory over time. We have been using silver collagen and PolyMen. She is coming in once a week for a doctor's and once a week for nurse visits. We are actually making some progress 09/18/17; the patient's wounds are smaller especially on the right medial. She arrives today to upset to consider even washing these off with Anasept which I think is been part of the reason this is been closing. We've been using collagen covered in PolyMen otherwise. It is noted that she has a small area of folliculitis on  the right medial calf that. As we are wrapping her legs I'll give her a short course of doxycycline to make sure this doesn't amount to anything. She is a long list of complaints today including imbalance, shortness of breath on exertion, inversion of her left ankle. With regards to the latter complaints she is been to see orthopedics and they offered her a tendon release surgery I believe but wanted her wounds to be closed  first. I have recommended she go see her primary physician with regards to everything else. 09/25/17; patient's wounds are about the same size. We have made some progress bilaterally although not in recent weeks. She will not allow me T wash these wounds with Anasept even if she is doing her cell. Wheeze we've been using collagen covered in PolyMen. Last week she had a small area of folliculitis this is now opened into a small wound. She completed 5 days of trimethoprim sulfamethoxazole 10/02/17; unfortunately the area on her left medial ankle is worse with a larger wound area towards the Achilles. The patient complains of a lot of pain. She will not allow debridement although visually I don't think there is anything to debridement in any case. We have been using silver collagen and PolyMen for several months now. Initially we are making some progress although I'm not really seeing that today. We will move back to [facility]. His admittedly this is a bit of a repeat however I'm hoping that his situation is different now. The patient tells me she had her leg on the left give out on her yesterday this is process some pain. 10/09/17; the patient is seen twice a week largely because of drainage issues coming out of the chronic medial bimalleolar wounds that are chronic. Last week the dimensions of the one on the left looks a little larger I changed her to [facility]. She comes in today with a history of terrible pain in the bilateral wound areas. She will not allow debridement. She will not even allow a tissue culture. There is no surrounding erythema no no evidence of cellulitis. We have been putting her Kerlix Coban man. She will not allow more aggressive compression as there was a suggestion to put her in 3 layer wraps. 10/16/17; large wounds on her bilateral medial malleoli. These are chronic. Not much change from last week. The surface looks have healthy but absolutely no epithelialization. A lot  of pain little less so of drainage. She will not allow debridement or even washing these off in the vigorous fashion with Anasept. 10/23/17; large wounds on her bilateral malleoli which are chronic. Some improvement in terms of size perhaps on the right since last time I saw these. She states that after we increased the 3 layer compression there was some bleeding, when she came in for a nurse visit she did not want 3 layer compression put back on about our nurse managed to convince her. She has known chronic venous visit issues and I'm hoping to get her to tolerate the 3 layer compression. using Hydrofera Blue 10/30/17; absolutely no change in the condition of either wound although we've had some improvement in dimensions on the right.. Attempted to put her in 3 layer compression she didn't tolerated she is back in 2 layer compression. We've been using Hydrofera Blue We looked over her past records. She had venous reflux studies in November 2016. There was no evidence of deep venous reflux on the right. Superficial vein did not show the greater  saphenous vein at think this is been previously ablated the small saphenous vein was within normal limits. The left deep venous system showed no DVT the vessels were positive for deep venous reflux in the posterior tibial veins at the ankle. The greater saphenous vein was surgically absent small saphenous vein was within normal limits. She went to vein and vascular at [facility]. I believe she had an ablation on the left greater saphenous vein. I'll update her reflux studies perhaps ever reviewed by vein and vascular. We've made absolutely no progress in these wounds. Will also try to read and TheraSkins through her insurance 11/06/17; W the patient apparently has a 2 week follow-up with vein and vascular I like him to review the whole issue with regards to her previous vascular workup by Dr. Aleda Grana. We've really made no progress on these wounds in many  months. She arrives today with less viable looking surface on the left medial ankle wound. This was apparently looking about the same on Tuesday when she was here for nurse visit. 11/13/17; deep tissue culture I did last time of the left lower leg showed multiple organisms without any predominating. In particular no Staphylococcus or group A strep were isolated. We sent her for venous reflux studies. She's had a previous left greater saphenous vein stripping and I think sclerotherapy of the right greater saphenous vein. She didn't really look at the lesser saphenous vein this both wounds are on the medial aspect. She has reflux in the common femoral vein and popliteal vein and an accessory vein on the right and the common femoral vein and popliteal vein on the left. I'm going to have her go to see vein and vascular just the look over things and see if anything else beside aggressive compression is indicated here. We have not been able to make any progress on these wounds in spite of the fact that the surface of the wounds is never look too bad. 11/20/17; no major change in the condition of the wounds. Patient reports a large amount of drainage. She has a lot of complaints of pain although enlisting her today I wonder if some of this at least his neuropathic rather than secondary to her wounds. She has an appointment with vein and vascular on 12/30/17. The refractory nature of these wounds in my mind at least need vein and vascular to look over the wounds the recent reflux studies we did and her history to see if anything further can be done here. I also note her gait is deteriorated quite a bit. 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 She has a bilateral Trendelenburg gait. I wonder if this is neuropathic or perhaps multilevel radicular. 11/27/17; her wounds actually looks slightly better. Healthy-looking granulation tissue a scant amount of epithelialization. [place] healthcare will not pay  for [employer]. They will play for tri layer Oasis and Dermagraft. This is not a diabetic ulcer. We'll try for the tri layer Oasis. She still complains of some drainage. She has a vein and vascular appointment on 12/30/17 12/04/17; the wounds visually look quite good. Healthy-looking granulation with some degree of epithelialization. We are still waiting for response to our request for trial to try layer Oasis. Her appointment with vascular to review venous and arterial issues isn't sold the end of July 7/31. Not allow debridement or even vigorous cleansing of the wound surface. 12/18/17; slightly smaller especially on the right. 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 We've been  using Hydrofera Blue. We still are looking into triple layer Oasis through her insurance 01/08/18 on evaluation today patient's wound actually appears to be showing signs of good improvement at this point in time. She has been tolerating the dressing changes without complication. Fortunately there does not appear to be any evidence of infection at this point in time. We have been utilizing silver nitrate which does seem to be of benefit for her which is also good news. Overall I'm very happy with how things seem to be both regards appearance as well as measurement. Patient did see Dr. Bridgett Larsson for evaluation on 12/30/17. In his assessment he felt that stripping would not likely add much more than chronic compression to the patient's healing process. His recommendation was to follow-up in three months with Dr. Doren Custard if she hasn't healed in order to consider referral back to you and see vascular where she previously was in a trial and was able to get her wound to heal. I'll be see what she feels she when you staying compression and he reiterated this as well. 01/13/18 on evaluation today patient appears to actually be doing very well in regard to her bilateral medial malleolus ulcers. She seems to have  tolerated the chemical cauterization with silver nitrate last week she did have some pain through that evening but fortunately states that I'll be see since it seems to be doing better she is overall pleased with the progress. 01/21/18; really quite a remarkable improvement since I've last seen these wounds. We started using silver nitrate specially on the islands of hyper granulation which for some reason her around the wound circumference. This is really done quite nicely. Primary dressing Hydrofera Blue under 4 layer compression. She seems to be able to hold out without a nurse rewrap. Follow-up in 1 week 01/28/18; we've continued the hydrofera blue but continued with chemical cauterization to the wound area that we started about a month ago for irregular hyper granulation. She is made almost stunning improvement in the overall wound dimensions. I was not really expecting this degree of improvement in these chronic wounds 02/05/18; we continue with Hydrofera Bluebut of also continued the aggressive chemical cauterization with silver nitrate. We made nice progress with the right greater than left wound. 02/12/18. We continued with Hydrofera Blue after aggressive chemical cauterization with silver nitrate. We appear to be making nice progress with both wound areas 02/19/2018; we continue with [facility] after washing the wounds vigorously with Anasept spray and chemical cauterization with silver nitrate. We are making excellent progress. The area on the right's just about closed 02/26/2018. The area on the left medial ankle had too much necrotic debris today. I used a #5 curette we are able to get most of the soft. I continued with the silver nitrate to the much smaller wound on the right medial ankle she had a new area on her right lower pretibial area which she says was due to a role in her compression 03/05/2018; both wound areas look healthy. Not much change in dimensions from last week. I  continue to use silver nitrate and Hydrofera Blue. The patient saw Dr. Doren Custard of vein and vascular. He felt she had venous stasis ulcers. He felt based on her previous arteriogram she should have adequate circulation for healing. Also she has deep venous reflux but really no significant correctable superficial venous reflux at this time. He felt we should continue with conservative management including leg elevation and compression 04/02/2018; since we last saw  this woman about a month ago she had a fall apparently suffered a pelvic fracture. I did not look up the x-ray. Nevertheless because of pain she literally was bedbound for 2 weeks and had home health coming out to change the dressing. Somewhat predictably this is resulted in considerable improvement in both wound areas. The right is just about closed on the medial malleolus and the left is about half the size. 04/16/2018; both her wounds continue to go down in size. Using Hydrofera Blue. 05/07/18; both her wounds appeared to be improving especially on the right where it is almost closed. We are using Hydrofera Blue 05/14/2018; slightly worse this week with larger wounds. Surface on the left medial not quite as good. We have been using Hydrofera Blue 05/21/18; again the wounds are slightly larger. Left medial malleolus slightly larger with eschar around the circumference. We have been using Hydrofera Blue undergoing a wraps for a prolonged period of time. This got a lot better when she was more recumbent due to a fall and a back injury. I change the primary dressing the silver alginate today. She did not tolerate a 4 layer compression previously although I may need to bring this up with her next time 05/28/2018; area on the left medial malleolus again is slightly larger with more drainage. Area on the right is roughly unchanged. She has a small area of folliculitis on the right medial just on the lower calf. This does not look ominous. 06/03/2018  left medial malleolus slightly smaller in a better looking surface. We used silver nitrate on this last time with silver alginate. The area on the right appears slightly smaller 1/10; left medial malleolus slightly smaller. Small open area on the right. We used silver nitrate and silver alginate as of 2 weeks ago. We continue with the wound and compression. These got a lot better when she was off her feet 1/17; right medial malleolus wound is smaller. The left may be slightly smaller. Both surfaces look somewhat better. 1/24; both wounds are slightly smaller. Using silver alginate under Unna boots 1/31; both wounds appear smaller in fact the area on the right medial is just about closed. Surface eschar. We have been using silver alginate under Unna boots. The patient is less active now spends let much less time on her feet and I think this is contributed to the general improvement in the wound condition 2/7; both wounds appear smaller. I was hopeful the right medial would be closed however there there is still the same small open area. Slight amount of surface eschar on the left the dimensions are smaller there is eschar but the wound edges appear to be free. We have been using silver alginate under Unna boot's 2/14; both wounds once again measure smaller. Circumferential eschar on the left medial. We have been using silver alginate under Unna boots with gradual improvement 2/21; the area on the right medial malleolus has healed. The area on the left is smaller. We have been using silver alginate and Unna boots. We can discharge wrapping the right leg she has 20/30 stockings at home she will need to protect the scar tissue in this area 2/28; the area on the right medial malleolus remains closed the patient has a compression stocking. The area on the left is smaller. We have been using silver alginate and Unna boots. 3/6 the area on the right medial ankle remains closed. Good edema control noted she  is using her own compression stocking. The area on  the left medial ankle is smaller. We have been managing this with silver alginate and Unna boots which we will continue today. 3/13; the area on the right medial ankle remains closed and I'm declaring it healed today. When necessary the left is about the same still a healthy-looking surface but no major change and wound area. No evidence of infection and using silver alginate under unna and generally making considerable improvement 3/27 the area on the right medial ankle remains closed the area on the left is about the same as last week. Certainly not any worse we have been using silver alginate under an Unna boot 4/3; the area on the right medial ankle remains closed per the patient. We did not look at this wound. The wound on the left medial ankle is about the same surface looks healthy we have been using silver alginate under an Unna boot 4/10; area on the right medial ankle remains closed per the patient. We did not look at this wound. The wound on the left medial ankle is slightly larger. The patient complains that the [name] caused burning pain all week. She also told us that she was a lot more active this week. Changed her back to silver alginate 4/17; right medial ankle still closed per the patient. Left medial ankle is slightly larger. Using silver alginate. She did not tolerate Hydrofera Blue on this area 4/24; right medial ankle remains closed we have not look at this. The left medial ankle continues to get larger today by about a centimeter. We have been using silver alginate under Unna boots. She complains about 4 layer compression as an alternative. She has been up on her feet working on her garden 5/8; right medial ankle remains closed we did not look at this. The left medial ankle has increased in size about 100%. We have been using silver alginate under Unna boots. She noted increased pain this week and was not surprised that  the wound is deteriorated 5/15; no major change in SA however much less erythema ( one week of doxy ocellulitis). 5/22-65 year old female returns at 1 week to the clinic for left medial ankle wound for which we have been using silver alginate under 3 layer compression She was placed on DOXY at last visit - the wound is wider at this visit. She is in 3 layer compression 5/29; change to [facility] last week. I had given her empiric doxycycline 2 weeks ago for a week. She is in 3 layer compression. She complains of a lot of pain and drainage on presentation today. 6/5; using Hydrofera Blue. I gave her doxycycline recently empirically for erythema and pain around the wound. Believe her cultures showed enterococcus which not would not have been well covered by doxycycline nevertheless the wound looks better and I don't feel specifically that the enterococcus needs to be covered. She has a new what looks like a wrap injury on her lateral left ankle. 6/12; she is using Hydrofera Blue. She has a new area on the left anterior lower tibial area. This was a wrap injury last week. 6/19; the patient is using Hydrofera Blue. She arrived with marked inflammation and erythema around the wound and tenderness. 12/01/18 on evaluation today patient appears to be doing a little bit better based on what I'm hearing from the standpoint of lassos evaluation to this as far as the overall appearance of the wound is concerned. Then sometime substandard she typically sees Dr. Dellia Nims. Nonetheless overall very pleased with the  progress that she's made up to this point. No fevers, chills, nausea, or vomiting noted at this time. 7/10; some improvement in the surface area. Aggressively debrided last week apparently. I went ahead with the debridement today although the patient does not tolerate this very well. We have been using Iodoflex. Still a fair amount of drainage 7/17; slightly smaller. Using Iodoflex. 7/24; no change from  last week in terms of surface area. We have been using Iodoflex. Surface looks and continues to look somewhat better 7/31; surface area slightly smaller better looking surface. We have been using Iodoflex. This is under Unna boot compression 8/7-Patient presents at 1 week with Unna boot and Iodoflex, wound appears better 8/14-Patient presents at 1 week with Iodoflex, we use the Unna boot, wound appears to be stable better.Patient is getting Botox treatment for the inversion of the foot for tendon release, Next week 8/21; we are using Iodoflex. Unna boot. The wound is stable in terms of surface area. Under illumination there is some areas of the wound that appear to be either epithelialized or perhaps this is adherent slough at this point I was not really clear. It did not wipe off and I was reluctant to debride this today. 8/28; we are using Iodoflex in an Unna boot. Seems to be making good improvement. 9/4; using Iodoflex and wound is slightly smaller. 9/18; we are using Iodoflex with topical silver nitrate when she is here. The wound continues to be smaller 10/2; patient missed her appointment last week due to GI issues. She left and Iodoflex based dressing on for 2 weeks. Wound is about the same size about the size of a dime on the left medial lower 10/9 we have been using Iodoflex on the medial left ankle wound. She has a new superficial probable wrap injury on the dorsal left ankle 10/16; we have been using Hydrofera Blue since last week. This is on the left medial ankle 10/23; we have been using Hydrofera Blue since 2 weeks ago. This is on the left medial ankle. Dimensions are better 11/6; using Hydrofera Blue. I think the wound is smaller but still not closed. Left medial ankle 11/13; we have been using Hydrofera Blue. Wound is certainly no smaller this week. Also the surface not as good. This is the remanent of a very large area on her left medial ankle. 11/20; using Sorbact since last  week. Wound was about the same in terms of size although I was disappointed about the surface debris 12/11; 3-week follow-up. Patient was on vacation. Wound is measuring slightly larger we have been using Sorbact. 12/18; wound is about the same size however surface looks better last week after debridement. We have been using Sorbact under compression 1/15 wound is probably twice the size of last time increased in length nonviable surface. We have been using Sorbact. She was running a mild fever and missed her appointment last week 1/22; the wound is come down in size but under illumination still a very adherent debris we have been Hydrofera Blue that I changed her to last week 1/29; dimensions down slightly. We have been using Hydrofera Blue 2/19 dimensions are the same however there is rims of epithelialization under illumination. Therefore more the surface area may be epithelialized 2/26; the patient's wound actually measures smaller. The wound looks healthy. We have been using Hydrofera Blue. I had some thoughts about running Apligraf then I still may do that however this looks so much better this week we will delay that  for now 3/5; the wound is small but about the same as last week. We have been using Hydrofera Blue. No debridement is required today. 3/19; the wound is about the size of a dime. Healthy looking wound even under illumination. We have been using Hydrofera Blue. No mechanical debridement is necessary 3/26; not much change from last week although still looks very healthy. We have been using Hydrofera Blue under Unna boots Patient was offered an ankle fusion by podiatry but not until the wound heals with a proceed with this. 4/9; the patient comes in today with her original wound on the medial ankle looking satisfactory however she has some uncontrolled swelling in the middle part of her leg with 2 new open areas superiorly just lateral to the tibia. I think this was probably a wrap  issue. She said she felt uncomfortable during the week but did not call in. We have been using Hydrofera Blue 4/16; the wound on the medial ankle is about the same. She has innumerable small areas superior to this across her mid tibia. I think this is probably folliculitis. She is also been working in the yard doing a lot of sweating 4/30; the patient issue on the upper areas across her mid tibia of all healed. I think this was excessive yard work if I remember. Her wound on the medial ankle is smaller. Some debris on this we have been using Hydrofera Blue under Unna boots 5/7; mid tibia. She has been using Hydrofera Blue under an Unna wrap. She is apparently going for her ankle surgery on June 3 10/28/19-Patient returns to clinic with the ankle wound, we are using Hydrofera Blue under Unna wrap, surgery is scheduled for her left foot for June 3 so she will be back for nurse visit next week READMISSION 01/17/2020 [name] is a 65 year old woman we have had in this clinic for a long period of time with severe venous hypertension and refractory wounds on her medial lower legs and ankles bilaterally. 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 When she was off her legs for a prolonged period example when she fell and suffered a compression fracture things would heal up quite nicely. She is now retired and we managed to heal up the right medial leg wound. The left one was very tiny last time I saw this although still refractory. 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 It got to the point where this was interfering with her walking and she elected to proceed with a ankle arthrodesis to straighten her her ankle and leave her with a functional outcome for mobilization. The patient was referred  to Dr. Doren Custard and really this took some time to arrange. Dr. Doren Custard saw her on 12/07/2019. Once again he verified that she had no arterial issues. She had previously had an angiogram several years ago. Follow-up ABIs on the left showed an ABI of 1.12 with triphasic waveforms and a TBI of 0.92. She is felt to have chronic deep venous insufficiency but I do not think it was felt that anything could be done from about this from an ablation point of view. At the time Dr. Doren Custard saw this patient the wounds actually look closed via the pictures in his clinic. The patient finally underwent her surgery on  12/15/2019. This went reasonably well and there was a good anatomic outcome. She developed a small distal wound dehiscence on the lateral part of the surgical wound. However more problematically she is developed recurrence of the wound on the medial left ankle. There are actually 2 wounds here one in the distal lower leg and 1 pretty much at the level of the medial malleolus. It is a more distal area that is more problematic. She has been using Hydrofera Blue which started on Friday before this she was simply Ace wrapping. There was a culture done that showed Pseudomonas and she is on ciprofloxacin. A recent CNS on 8/11 was negative. The patient reports some pain but I generally think this is improving. She is using a cam boot completely nonweightbearing using a walker for pivot transfers and a wheelchair 8/24; not much improvement unfortunately she has a surgical wound on the lateral part in the venous insufficiency wound medially. The bottom part of the medial insufficiency wound is still necrotic there is exposed tendon here. We have been using Hydrofera Blue under compression. Her edema control is however better 8/31; patient in for follow-up of his surgical wound on the lateral part of her left leg and chronic venous insufficiency ulcers medially. We put her back in compression last week. She comes in today  with a complaint of 3 or 4 days [name] of increasing pain. She felt her cam walker was rubbing on the area on the back of her heel. However there is intense erythema seems more likely she has cellulitis. She had 2 cultures done when she was seeing podiatry in the postop. One of them in late July showed Pseudomonas and she received a course of ciprofloxacin the other was negative on 8/11 she is allergic to penicillin with anaphylactoid complaints of hives oral swelling via information in epic 9/9; when I saw this patient last week she had intense anterior erythema around her wound on the right lateral heel and ankle and also into the right medial heel. Some of this was no doubt drainage and her walker boot however I was convinced she had cellulitis. I gave her Levaquin and Bactrim she is finishing up on this now. She is following up with Dr. [name] he saw her yesterday. He is taken her out of the walking boot of course she is still nonweightbearing. Her x-ray was negative for any worrisome features such as soft tissue air etc. Things are a lot better this week. She has home health. We have been using Hydrofera Blue under an [employer] which she put back on yesterday. I did not wrap her last week 9/17; her surrounding skin looks a lot better. In fact the area on the left lateral ankle has just a scant amount of eschar. The only remaining wound is the large area on the left medial ankle. Probably about 60% of this is healthy granulation at the surface however she has a significant divot distally. This has adherent debris in it. I been using debridement and silver collagen to try and get this area to fill-in although I do not think we have made much progress this week 9/24; the patient's wound on the left medial ankle looks a lot better. The deeper divot area distally still requires debridement but this is cleaning up quite nicely we have been using silver collagen. The patient is complaining of swelling in  her foot and is worried that that is contributing to the nonhealing of the ankle wound. She is also  complaining of numbness in her anterior toes 10/4; left medial ankle. The small area distally still has a divot with necrotic material that I have been debriding away. This has an undermining area. She is approved for Apligraf. She saw Dr. [name] her surgeon on 10/1. I think he declared himself is satisfied with the condition of things. Still nonweightbearing till the end of the month. 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 Her surgical wound is well closed. There is no evidence of infection 10/11; the patient arrived in clinic today with the expectation that we be able to put an Apligraf on this area after debridement however she arrives with a relatively large amount of green drainage on the dressing. The patient states that this started on Friday. She has not been systemically unwell. 10/19; culture I did last week showed both Enterococcus and Pseudomonas. I think this came in separate parts because I stopped her ciprofloxacin I gave her and prescribed her linezolid however now looking at the final culture result this is Pseudomonas which is resistant to quinolones. She has not yet picked up the linezolid apparently phone issues. We are also trying to get a topical antibiotic out of [facility] in [place] they can be applied by home health. She is still having green drainage 10/16; the patient has her topical antibiotic from [facility] in [place]. This is a compounded gel with vancomycin and ciprofloxacin and gentamicin. We are applying this on the wound bed with silver alginate over the top with Unna boot wraps. She arrives in clinic today with a lot less ominous looking drainage although she is only use this topical preparation once the second time today. She sees Dr. [name] her surgeon on Friday she has home health changing the dressing 11/2; still  using her compounded topical antibiotic under silver alginate. Surface is cleaning up there is less drainage. We had an Apligraf for her today and I elected to apply it. A light coating of her antibiotic 04/25/2020 upon evaluation today patient appears to be doing well with regard to her ankle ulcer. There is a little bit of slough noted on the surface of the wound I am can have to perform sharp debridement to clear this away today. With that being said other than that fact overall I feel like she is making progress and we do see some new epithelial growth. There is also some improvement in the depth of the wound and that distal portion. There is little bit of slough there as well. 12/7; 2-week follow-up. Apligraf #3. Dimensions are smaller. Closing in especially inferiorly. Still some surface debris. Still using the [facility] topical antibiotic but I told her that I don't think this needs to be renewed 12/21; 2-week follow-up. Apligraf #4 dimensions are smaller. Nice improvement 06/05/2020; 2-week follow-up. The patient's wound on the left medial ankle looks really excellent. Nice granulation. Advancing epithelialization no undermining no evidence of infection. We would have to reapply for another Apligraf but with the condition of this wound I did not feel strongly about it. We used Hydrofera Blue under the same degree of compression. She follows up with Dr. [name] her surgeon a week Friday 06/13/2020 upon evaluation today patient appears to be doing excellent in regard to her wound. She has been tolerating the dressing changes without complication. Fortunately there is no signs of active infection at this time. No fevers, chills, nausea, vomiting, or diarrhea. She was using Hydrofera Blue last week. 06/20/2020 06/20/2020 on  evaluation today patient actually appears to be doing excellent in regard to her wound. This is measuring better and looking much better as well. She has been using the collagen that  seems to be doing better for her as well even though the [facility] was and is not sticking or feeling as rough on her wound. She did see Dr. [name] on Friday he is very pleased he also stated none of the hardware has shifted. That is great news 1/27; the patient has a small clean wound all that is remaining. I agree that this is too small to really consider an Apligraf. Under illumination the surface is looking quite good. We have been using collagen although the dimensions are not any better this week 2/2; the patient has a small clean wound on the left medial ankle. Although this left of her substantial original areas. Measurements are smaller. We have been using polymen Ag under an Haematologist. 2/10; small area on the left medial ankle. This looks clean nothing to debride however dimensions are about the same we have been using polymen I think now for 2 weeks 2/17; not much change in surface area. We have been using polymen Ag without any improvement. 3/17; 1 month follow-up. The patient has been using endoform without any improvement in fact I think this looks worse with more depth and more expansion 3/24; no improvement. Perhaps less debris on the surface. We have been using Sorbact for 1 week 4/4; wound measures larger. She has edema in her leg and her foot which she tells as her wrap came down. We have been using Unna boots. Sorbact of the wound. She has been approved for Apligraf 09/12/2020 upon evaluation today patient appears to be doing well with regard to her wound. We did get the Apligraf reapproved which is great news we have that available for application today. Fortunately there is no signs of infection and overall the patient seems to be doing great. The wound bed is nice and clean. Objective Constitutional Well-nourished and well-hydrated in no acute distress. Vitals Time Taken: 10:37 AM, Height: 68 in, Temperature: 97.5 F, Pulse: 60 bpm, Respiratory Rate: 17 breaths/min, Blood  Pressure: 147/76 mmHg. Respiratory normal breathing without difficulty. Psychiatric this patient is able to make decisions and demonstrates good insight into disease process. Alert and Oriented x 3. pleasant and cooperative. General Notes: Patient's wound bed showed signs of good granulation epithelization at this point. There does not appear to be any signs of infection and overall I think she is definitely ready for reapplication of Apligraf today. Overall I am extremely pleased with where things stand at this point. We did reapply the Apligraf today and secured with Adaptic and Steri-Strips the patient tolerated all that without complication. Integumentary (Hair, Skin) Wound #15 status is Open. Original cause of wound was Gradually Appeared. The date acquired was: 12/30/2019. The wound has been in treatment 34 weeks. The wound is located on the Left,Medial Malleolus. The wound measures 2.5cm length x 1.7cm width x 0.1cm depth; 3.338cm^2 area and 0.334cm^3 volume. 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 A skin graft procedure using a bioengineered skin substitute/cellular or tissue based product was performed by Melburn Hake,  [place], [place] with the following instrument(s): Blade, Forceps, and Scissors. Apligraf was applied and secured with Steri-Strips. 11 sq cm of product was utilized and 33 sq cm was wasted due to wound size. Post Application, adaptic, gauze, ABD was applied. A Time Out was conducted at 11:10, prior to the start of the procedure. The procedure was tolerated well with a pain level of 0 throughout and a pain level of 0  following the procedure. Post procedure Diagnosis Wound #15: Same as Pre-Procedure . Pre-procedure diagnosis of Wound #15 is a Venous Leg Ulcer located on the Left,Medial Malleolus . There was a Haematologist Compression Therapy Procedure by [name], RN. Post procedure Diagnosis Wound #15: Same as Pre-Procedure Plan Follow-up Appointments: Return Appointment in 2 weeks. - MD for next apligraf Nurse Visit: - 1 week post apligraf Cellular or Tissue Based Products: Cellular or Tissue Based Product applied to wound bed, secured with steri-strips, cover with Adaptic or Mepitel. (DO NOT REMOVE). - Apligraf #1 Bathing/ Shower/ Hygiene: May shower with protection but do not get wound dressing(s) wet. Edema Control - Lymphedema / SCD / Other: Elevate legs to the level of the heart or above for 30 minutes daily and/or when sitting, a frequency of: - throughout the day Avoid standing for long periods of time. Exercise regularly Compression stocking or Garment 20-30 mm/Hg pressure to: - right leg daily WOUND #15: - Malleolus Wound Laterality: Left, Medial Cleanser: Soap and Water 1 x Per Week/ Discharge Instructions: May shower and wash wound with dial antibacterial soap and water prior to dressing change. Peri-Wound [name]: Sween Lotion (Moisturizing lotion) 1 x Per Week/ Discharge Instructions: Apply moisturizing lotion as directed Secondary Dressing: Woven Gauze Sponge, Non-Sterile 4x4 in 1 x Per Week/ Discharge Instructions: Apply over primary dressing as directed. Secondary Dressing: ABD Pad, 5x9 1 x Per Week/ Discharge Instructions: Apply over primary dressing as directed. Com pression Wrap: Unnaboot w/Calamine, 4x10 (in/yd) 1 x Per Week/ Discharge Instructions: Apply Unnaboot as directed. 1. Would recommend that we going continue with the wound [name] measures as before and the patient is in agreement with the plan that includes the use of the compression wrapping over top of the  Apligraf. I think that still is appropriate to be honest. She is using an Haematologist wrap. 2. I am also can recommend she continue to elevate her legs much as possible. 3. We will see her next week for a nurse visit and then subsequently she will have a reapplication of the Apligraf in 2 weeks. We will see patient back for reevaluation in 1 week here in the clinic. If anything worsens or changes patient will contact our office for additional recommendations. Electronic Signature(s) Signed: 09/12/2020 11:27:44 AM By: [name] Keeler PA-C Entered By: [name] on 09/12/2020 11:27:44 -------------------------------------------------------------------------------- SuperBill Details Patient Name: Date of Service: [facility] [name], [name] 09/12/2020 Medical Record Number: [id number] Patient Account Number: [id number] Date of Birth/Sex: Treating RN: 02-10-1956 (65 y.o. [name], Linda Primary [name] [name]: [name] Other Clinician: Referring [name]: Treating [name]/Extender: [name] in Treatment: 34 Diagnosis Coding ICD-10 Codes Code Description [phone] Chronic venous hypertension (idiopathic) with ulcer and inflammation of left lower extremity L97.828 Non-pressure chronic ulcer of other part of left lower leg with other specified severity L97.328 Non-pressure chronic ulcer of left ankle with other specified severity Facility Procedures CPT4 Code: [id number] Description: (Facility Use Only) Apligraf 1 SQ CM Modifier: Quantity: [place] Code: [id number] Description: [id number] -  SKIN SUB GRAFT TRNK/ARM/LEG ICD-10 Diagnosis Description L97.328 Non-pressure chronic ulcer of left ankle with other specified severity Modifier: Quantity: 1 Physician Procedures : CPT4 Code Description Modifier [id number] - WC PHYS SKIN SUB GRAFT TRNK/ARM/LEG ICD-10 Diagnosis Description L97.328 Non-pressure chronic ulcer of left ankle with other specified severity Quantity:  1 Electronic Signature(s) Signed: 09/12/2020 11:28:09 AM By: [name] Keeler PA-C Entered By: [name] on 09/12/2020 11:28:08

## 2020-09-13 NOTE — Progress Notes (Signed)
KATERI, BALCH ([id number]) Visit Report for 09/12/2020 Arrival Information Details Patient Name: Date of Service: [facility], [name] 09/12/2020 10:00 A M Medical Record Number: [id number] Patient Account Number: [id number] Date of Birth/Sex: Treating RN: 1955-07-14 (65 y.o. [name] Hayzel Ruberg: [name] Other Clinician: Referring Corwyn Vora: Treating Stassi Fadely/Extender: [name] in Treatment: 70 Visit Information History Since Last Visit Added or deleted any medications: No Patient Arrived: Cane Any new allergies or adverse reactions: No Arrival Time: 10:36 Had a fall or experienced change in No Accompanied By: self activities of daily living that may affect Transfer Assistance: None risk of falls: Patient Identification Verified: Yes Signs or symptoms of abuse/neglect since last visito No Secondary Verification Process Completed: Yes Hospitalized since last visit: No Patient Requires Transmission-Based Precautions: No Implantable device outside of the clinic excluding No Patient Has Alerts: Yes cellular tissue based products placed in the center Patient Alerts: L ABI =1.12, TBI = .92 since last visit: R ABI= 1.02, TBI= .58 Has Dressing in Place as Prescribed: Yes Pain Present Now: Yes Electronic Signature(s) Signed: 09/12/2020 10:47:54 AM By: [name] Entered By: [name] on 09/12/2020 10:37:08 -------------------------------------------------------------------------------- Compression Therapy Details Patient Name: Date of Service: [name], [name] 09/12/2020 10:00 A M Medical Record Number: [id number] Patient Account Number: [id number] Date of Birth/Sex: Treating RN: January 21, 1956 (65 y.o. [name] Laiken Nohr: [name] Other Clinician: Referring Marlinda Miranda: Treating Sarrinah Gardin/Extender: [name] in Treatment: 34 Compression Therapy Performed for Wound  Assessment: Wound #15 Left,Medial Malleolus Performed By: Clinician [name], RN Compression Type: Rolena Infante Post Procedure Diagnosis Same as Pre-procedure Electronic Signature(s) Signed: 09/12/2020 5:38:32 PM By: Baruch Gouty RN, BSN Entered By: Baruch Gouty on 09/12/2020 11:13:58 -------------------------------------------------------------------------------- Encounter Discharge Information Details Patient Name: Date of Service: [name], [name] 09/12/2020 10:00 A M Medical Record Number: [id number] Patient Account Number: [id number] Date of Birth/Sex: Treating RN: 07/04/1955 (65 y.o. [name], [name] Primary Care Ples Trudel: [name] Other Clinician: Referring Lorin Gawron: Treating Amere Iott/Extender: [name] in Treatment: 85 Encounter Discharge Information Items Post Procedure Vitals Discharge Condition: Stable Temperature (F): 97.4 Ambulatory Status: Cane Pulse (bpm): 74 Discharge Destination: Home Respiratory Rate (breaths/min): 17 Transportation: Private Auto Blood Pressure (mmHg): 124/74 Accompanied By: self Schedule Follow-up Appointment: Yes Clinical Summary of Care: Patient Declined Electronic Signature(s) Signed: 09/13/2020 5:45:48 PM By: [name] RN Entered By: [name] on 09/12/2020 13:49:50 -------------------------------------------------------------------------------- Lower Extremity Assessment Details Patient Name: Date of Service: [name], [name] 09/12/2020 10:00 A M Medical Record Number: [id number] Patient Account Number: [id number] Date of Birth/Sex: Treating RN: 02/03/1956 (65 y.o. [name] Nekeya Briski: [name] Other Clinician: Referring Urian Martenson: Treating Bela Nyborg/Extender: [name] in Treatment: 34 Edema Assessment Assessed: [Left: No] [Right: No] Edema: [Left: Ye] [Right: s] Calf Left: Right: Point of Measurement:  31 cm From Medial Instep 25 cm Ankle Left: Right: Point of Measurement: 11 cm From Medial Instep 21.5 cm Vascular Assessment Pulses: Dorsalis Pedis Palpable: [Left:Yes] Electronic Signature(s) Signed: 09/12/2020 5:38:32 PM By: Baruch Gouty RN, BSN Entered By: Baruch Gouty on 09/12/2020 11:11:31 -------------------------------------------------------------------------------- [place] Details Patient Name: Date of Service: [name], [name] 09/12/2020 10:00 A M Medical Record Number: [id number] Patient Account Number: [id number] Date of Birth/Sex: Treating RN: 1956-05-05 (65 y.o. [name] Reesha Debes: [name] Other Clinician: Referring  Harshaan Whang: Treating Garlen Reinig/Extender: [name] in Treatment: 46 Multidisciplinary Care Plan reviewed with physician Active Inactive Venous Leg Ulcer Nursing Diagnoses: Actual venous Insuffiency (use after diagnosis is confirmed) Knowledge deficit related to disease process and management Goals: Patient will maintain optimal edema control Date Initiated: 06/20/2020 Target Resolution Date: 10/05/2020 Goal Status: Active Interventions: Assess peripheral edema status every visit. Compression as ordered Treatment Activities: Therapeutic compression applied : 06/20/2020 Notes: Wound/Skin Impairment Nursing Diagnoses: Impaired tissue integrity Knowledge deficit related to ulceration/compromised skin integrity Goals: Patient/caregiver will verbalize understanding of skin care regimen Date Initiated: 01/17/2020 Target Resolution Date: 10/05/2020 Goal Status: Active Ulcer/skin breakdown will have a volume reduction of 30% by week 4 Date Initiated: 01/17/2020 Date Inactivated: 03/12/2020 Target Resolution Date: 03/09/2020 Goal Status: Met Interventions: Assess patient/caregiver ability to obtain necessary supplies Assess patient/caregiver ability to perform ulcer/skin  care regimen upon admission and as needed Assess ulceration(s) every visit Provide education on ulcer and skin care Notes: Electronic Signature(s) Signed: 09/12/2020 5:38:32 PM By: Baruch Gouty RN, BSN Entered By: Baruch Gouty on 09/12/2020 11:11:53 -------------------------------------------------------------------------------- Pain Assessment Details Patient Name: Date of Service: [name], [name] 09/12/2020 10:00 A M Medical Record Number: [id number] Patient Account Number: [id number] Date of Birth/Sex: Treating RN: 22-May-1956 (65 y.o. [name] Adeena Bernabe: [name] Other Clinician: Referring Marchia Diguglielmo: Treating Marilyne Haseley/Extender: [name] in Treatment: 43 Active Problems Location of Pain Severity and Description of Pain Patient Has Paino Yes Site Locations Rate the pain. Current Pain Level: 4 Pain Management and Medication Current Pain Management: Electronic Signature(s) Signed: 09/12/2020 10:47:54 AM By: [name] Signed: 09/12/2020 5:38:32 PM By: Baruch Gouty RN, BSN Entered By: [name] on 09/12/2020 10:37:36 -------------------------------------------------------------------------------- Patient/Caregiver Education Details Patient Name: Date of Service: [name], [name] 4/13/2022andnbsp10:00 A M Medical Record Number: [id number] Patient Account Number: [id number] Date of Birth/Gender: Treating RN: Oct 20, 1955 (65 y.o. [name] Physician: [name] Other Clinician: Referring Physician: Treating Physician/Extender: [name] in Treatment: 43 Education Assessment Education Provided To: Patient Education Topics Provided Venous: Methods: Explain/Verbal Responses: Reinforcements needed, State content correctly Wound/Skin Impairment: Methods: Explain/Verbal Responses: Reinforcements needed, State content correctly Electronic  Signature(s) Signed: 09/12/2020 5:38:32 PM By: Baruch Gouty RN, BSN Entered By: Baruch Gouty on 09/12/2020 11:12:28 -------------------------------------------------------------------------------- Wound Assessment Details Patient Name: Date of Service: [name], [name] 09/12/2020 10:00 A M Medical Record Number: [id number] Patient Account Number: [id number] Date of Birth/Sex: Treating RN: 12-11-1955 (65 y.o. [name] Sharmane Dame: [name] Other Clinician: Referring Shamal Stracener: Treating Lonni Dirden/Extender: [name] in Treatment: 34 Wound Status Wound Number: 15 Primary Venous Leg Ulcer Etiology: Wound Location: Left, Medial Malleolus Wound Status: Open Wounding Event: Gradually Appeared Comorbid Peripheral Venous Disease, Osteoarthritis, Confinement Date Acquired: 12/30/2019 History: Anxiety Weeks Of Treatment: 34 Clustered Wound: No Photos Wound Measurements Length: (cm) 2.5 Width: (cm) 1.7 Depth: (cm) 0.1 Area: (cm) 3.338 Volume: (cm) 0.334 % Reduction in Area: 71.4% % Reduction in Volume: 96.4% Epithelialization: Medium (34-66%) Wound Description Classification: Full Thickness With Exposed Support Structures Wound Margin: Well defined, not attached Exudate Amount: Medium Exudate Type: Serosanguineous Exudate Color: red, brown Foul Odor After Cleansing: No Slough/Fibrino Yes Wound Bed Granulation Amount: Large (67-100%) Exposed Structure Granulation Quality: Red Fascia Exposed: No Necrotic Amount: Small (1-33%) Fat Layer (Subcutaneous Tissue) Exposed: Yes Necrotic Quality: Adherent Slough Tendon Exposed: No Muscle Exposed: No Joint Exposed:  No Bone Exposed: No Treatment Notes Wound #15 (Malleolus) Wound Laterality: Left, Medial Cleanser Soap and Water Discharge Instruction: May shower and wash wound with dial antibacterial soap and water prior to dressing change. Peri-Wound Care Sween Lotion  (Moisturizing lotion) Discharge Instruction: Apply moisturizing lotion as directed Topical Primary Dressing Secondary Dressing Woven Gauze Sponge, Non-Sterile 4x4 in Discharge Instruction: Apply over primary dressing as directed. ABD Pad, 5x9 Discharge Instruction: Apply over primary dressing as directed. Secured With Compression Wrap Unnaboot w/Calamine, 4x10 (in/yd) Discharge Instruction: Apply Unnaboot as directed. Compression Stockings Add-Ons Electronic Signature(s) Signed: 09/12/2020 4:36:07 PM By: [name] Signed: 09/12/2020 5:38:32 PM By: Baruch Gouty RN, BSN Previous Signature: 09/12/2020 10:47:54 AM Version By: [name] Entered By: [name] on 09/12/2020 16:18:30 -------------------------------------------------------------------------------- Vitals Details Patient Name: Date of Service: [name], [name] 09/12/2020 10:00 A M Medical Record Number: [id number] Patient Account Number: [id number] Date of Birth/Sex: Treating RN: [birth year] (65 y.o. [name] Alexavier Tsutsui: [name] Other Clinician: Referring Tavionna Grout: Treating Jaiden Dinkins/Extender: [name] in Treatment: 62 Vital Signs Time Taken: 10:37 Temperature (F): 97.5 Height (in): 68 Pulse (bpm): 60 Respiratory Rate (breaths/min): 17 Blood Pressure (mmHg): 147/76 Reference Range: 80 - 120 mg / dl Electronic Signature(s) Signed: 09/12/2020 10:47:54 AM By: [name] Entered By: [name] on 09/12/2020 10:37:24

## 2020-09-18 ENCOUNTER — Ambulatory Visit (INDEPENDENT_AMBULATORY_CARE_PROVIDER_SITE_OTHER): Payer: Medicare Other

## 2020-09-18 ENCOUNTER — Ambulatory Visit (INDEPENDENT_AMBULATORY_CARE_PROVIDER_SITE_OTHER): Payer: Medicare Other | Admitting: Podiatry

## 2020-09-18 ENCOUNTER — Other Ambulatory Visit: Payer: Self-pay

## 2020-09-18 DIAGNOSIS — Z9889 Other specified postprocedural states: Secondary | ICD-10-CM

## 2020-09-18 DIAGNOSIS — M21542 Acquired clubfoot, left foot: Secondary | ICD-10-CM | POA: Diagnosis not present

## 2020-09-18 DIAGNOSIS — L97322 Non-pressure chronic ulcer of left ankle with fat layer exposed: Secondary | ICD-10-CM

## 2020-09-19 ENCOUNTER — Encounter (HOSPITAL_BASED_OUTPATIENT_CLINIC_OR_DEPARTMENT_OTHER): Payer: Medicare Other | Admitting: Physician Assistant

## 2020-09-19 DIAGNOSIS — I87332 Chronic venous hypertension (idiopathic) with ulcer and inflammation of left lower extremity: Secondary | ICD-10-CM | POA: Diagnosis not present

## 2020-09-20 NOTE — Progress Notes (Signed)
[name], [name] ([id number]) Visit Report for 09/19/2020 SuperBill Details Patient Name: Date of Service: [facility], [name] 09/19/2020 Medical Record Number: [id number] Patient Account Number: [id number] Date of Birth/Sex: Treating RN: 06-01-1956 (65 y.o. [name] Primary Care Provider: [name] Other Clinician: Referring Provider: Treating Provider/Extender: [name] in Treatment: 35 Diagnosis Coding ICD-10 Codes Code Description [phone] Chronic venous hypertension (idiopathic) with ulcer and inflammation of left lower extremity L97.828 Non-pressure chronic ulcer of other part of left lower leg with other specified severity L97.328 Non-pressure chronic ulcer of left ankle with other specified severity Facility Procedures CPT4 Code Description Modifier Quantity [id number] (Facility Use Only) [id number] - Dorise Bullion BOOT LT 1 Electronic Signature(s) Signed: 09/19/2020 11:32:37 AM By: [name] PA-C Signed: 09/20/2020 5:27:18 PM By: Levan Hurst RN, BSN Entered By: Levan Hurst on 09/19/2020 11:24:21

## 2020-09-20 NOTE — Progress Notes (Signed)
LEAIRAH, COONROD ([id number]) Visit Report for 09/19/2020 Arrival Information Details Patient Name: Date of Service: [facility], [name] 09/19/2020 10:00 A M Medical Record Number: [id number] Patient Account Number: [id number] Date of Birth/Sex: Treating RN: Mar 05, 1956 (65 y.o. [name] Primary Care Jourden Gilson: [name] Other Clinician: Referring Ceferino Lang: Treating Terrie Grajales/Extender: [name] in Treatment: 37 Visit Information History Since Last Visit Added or deleted any medications: No Patient Arrived: Cane Any new allergies or adverse reactions: No Arrival Time: 10:20 Had a fall or experienced change in No Accompanied By: alone activities of daily living that may affect Transfer Assistance: None risk of falls: Patient Identification Verified: Yes Signs or symptoms of abuse/neglect since last visito No Secondary Verification Process Completed: Yes Hospitalized since last visit: No Patient Requires Transmission-Based Precautions: No Implantable device outside of the clinic excluding No Patient Has Alerts: Yes cellular tissue based products placed in the center Patient Alerts: L ABI =1.12, TBI = .92 since last visit: R ABI= 1.02, TBI= .58 Has Dressing in Place as Prescribed: Yes Has Compression in Place as Prescribed: Yes Pain Present Now: No Electronic Signature(s) Signed: 09/20/2020 5:27:18 PM By: Levan Hurst RN, BSN Entered By: Levan Hurst on 09/19/2020 11:21:24 -------------------------------------------------------------------------------- Compression Therapy Details Patient Name: Date of Service: [name], [name] 09/19/2020 10:00 A M Medical Record Number: [id number] Patient Account Number: [id number] Date of Birth/Sex: Treating RN: [birth year] (65 y.o. [name] Primary Care Kwamane Whack: [name] Other Clinician: Referring Kaela Beitz: Treating Jaleisa Brose/Extender: [name] in Treatment:  35 Compression Therapy Performed for Wound Assessment: Wound #15 Left,Medial Malleolus Performed By: Clinician Levan Hurst, RN Compression Type: Rolena Infante Electronic Signature(s) Signed: 09/20/2020 5:27:18 PM By: Levan Hurst RN, BSN Entered By: Levan Hurst on 09/19/2020 11:22:27 -------------------------------------------------------------------------------- Encounter Discharge Information Details Patient Name: Date of Service: [name], [name] 09/19/2020 10:00 A M Medical Record Number: [id number] Patient Account Number: [id number] Date of Birth/Sex: Treating RN: [birth year] (65 y.o. [name] Primary Care Yerick Eggebrecht: [name] Other Clinician: Referring Fallyn Munnerlyn: Treating Elandra Powell/Extender: [name] in Treatment: 94 Encounter Discharge Information Items Discharge Condition: Stable Ambulatory Status: Ambulatory Discharge Destination: Home Transportation: Private Auto Accompanied By: alone Schedule Follow-up Appointment: Yes Clinical Summary of Care: Patient Declined Electronic Signature(s) Signed: 09/20/2020 5:27:18 PM By: Levan Hurst RN, BSN Entered By: Levan Hurst on 09/19/2020 11:23:55 -------------------------------------------------------------------------------- Wound Assessment Details Patient Name: Date of Service: [name], [name] 09/19/2020 10:00 A M Medical Record Number: [id number] Patient Account Number: [id number] Date of Birth/Sex: Treating RN: 22-Sep-1955 (65 y.o. [name] Primary Care Bevan Disney: [name] Other Clinician: Referring Malyk Girouard: Treating Zineb Glade/Extender: [name] Weeks in Treatment: 35 Wound Status Wound Number: 15 Primary Venous Leg Ulcer Etiology: Wound Location: Left, Medial Malleolus Wound Status: Open Wounding Event: Gradually Appeared Comorbid Peripheral Venous Disease, Osteoarthritis, Confinement Date Acquired: 12/30/2019 History:  Anxiety Weeks Of Treatment: 35 Clustered Wound: No Wound Measurements Length: (cm) 2.5 Width: (cm) 1.7 Depth: (cm) 0.1 Area: (cm) 3.338 Volume: (cm) 0.334 % Reduction in Area: 71.4% % Reduction in Volume: 96.4% Epithelialization: Medium (34-66%) Tunneling: No Undermining: No Wound Description Classification: Full Thickness With Exposed Support Structures Wound Margin: Well defined, not attached Exudate Amount: Medium Exudate Type: Serosanguineous Exudate Color: red, brown Foul Odor After Cleansing: No Slough/Fibrino Yes Wound Bed Granulation Amount: Large (67-100%) Exposed Structure Granulation Quality: Red Fascia Exposed: No Necrotic Amount: Small (1-33%)  Fat Layer (Subcutaneous Tissue) Exposed: Yes Necrotic Quality: Adherent Slough Tendon Exposed: No Muscle Exposed: No Joint Exposed: No Bone Exposed: No Treatment Notes Wound #15 (Malleolus) Wound Laterality: Left, Medial Cleanser Soap and Water Discharge Instruction: May shower and wash wound with dial antibacterial soap and water prior to dressing change. Peri-Wound Care Sween Lotion (Moisturizing lotion) Discharge Instruction: Apply moisturizing lotion as directed Topical Primary Dressing Secondary Dressing Woven Gauze Sponge, Non-Sterile 4x4 in Discharge Instruction: Apply over primary dressing as directed. ABD Pad, 5x9 Discharge Instruction: Apply over primary dressing as directed. Secured With Compression Wrap Unnaboot w/Calamine, 4x10 (in/yd) Discharge Instruction: Apply Unnaboot as directed. Compression Stockings Add-Ons Electronic Signature(s) Signed: 09/20/2020 5:27:18 PM By: Levan Hurst RN, BSN Entered By: Levan Hurst on 09/19/2020 11:22:09 -------------------------------------------------------------------------------- [place] Details Patient Name: Date of Service: [name], [name] 09/19/2020 10:00 A M Medical Record Number: [id number] Patient Account Number: [id number] Date of  Birth/Sex: Treating RN: 1955-07-27 (65 y.o. [name] Primary Care Harrison Paulson: [name] Other Clinician: Referring Jahlil Ziller: Treating Karleen Seebeck/Extender: [name] in Treatment: 35 Vital Signs Time Taken: 10:20 Temperature (F): 98.0 Height (in): 68 Pulse (bpm): 70 Respiratory Rate (breaths/min): 16 Blood Pressure (mmHg): 116/64 Reference Range: 80 - 120 mg / dl Electronic Signature(s) Signed: 09/20/2020 5:27:18 PM By: Levan Hurst RN, BSN Entered By: Levan Hurst on 09/19/2020 11:21:42

## 2020-09-25 NOTE — Progress Notes (Signed)
   Subjective:  Patient presents today status post tibiotalar calcaneal arthrodesis left lower extremity. DOS: 12/15/2019.  The patient is concerned due to the amount of swelling that she is having to the surgical extremity.  She states that with her increased activity she is noticing more swelling and she is concerned that it may be due to the orthopedic hardware or breakdown internally.  She presents for further treatment and evaluation  Past Medical History:  Diagnosis Date  . Allergy   . Ankle wound 02/2016   bilateral  . Dental bridge present    lower  . Dental crowns present    upper right  . Open wounds involving multiple regions of lower extremity   . Peripheral vascular disease (HCC)    venous stasis ulcers bilateral lower extremities  . PONV (postoperative nausea and vomiting)    Objective/Physical Exam Neurovascular status intact.  Otherwise the foot is in a rectus position with good alignment of the foot in relationship to the leg.  The foot is in an almost 90 degree angle.  There is some palpable orthopedic hardware noted to the lateral aspect of the heel bone.  Mild sensitivity to the area.  No open wounds noted.  Radiographic exam: Orthopedic hardware appears intact and stable.  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  Patient has now over 3 months postoperative and radiographically appears stable and healing well.  No significant change since last x-rays  Assessment: 1. s/p tibiotalar calcaneal arthrodesis left. DOS: 12/15/2019 2.  Postsurgical edema left-mostly resolved 3.  Ulcer right medial ankle secondary to venous insufficiency 4.  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 Patient was evaluated.  2.  Reassured the patient that the orthopedic hardware is intact and unchanged since last visit. 3.  Continue management at the wound care center for the ulcer of the ankle 4.  Return to clinic in 3  months  Edrick Kins, DPM [facility]  Dr. Edrick Kins, DPM    [address], [place] [address]                Office [phone]  Fax [phone]

## 2020-09-26 ENCOUNTER — Other Ambulatory Visit: Payer: Self-pay

## 2020-09-26 ENCOUNTER — Encounter (HOSPITAL_BASED_OUTPATIENT_CLINIC_OR_DEPARTMENT_OTHER): Payer: Medicare Other | Admitting: Internal Medicine

## 2020-09-26 DIAGNOSIS — L97328 Non-pressure chronic ulcer of left ankle with other specified severity: Secondary | ICD-10-CM

## 2020-09-26 DIAGNOSIS — I87332 Chronic venous hypertension (idiopathic) with ulcer and inflammation of left lower extremity: Secondary | ICD-10-CM

## 2020-09-26 DIAGNOSIS — L97828 Non-pressure chronic ulcer of other part of left lower leg with other specified severity: Secondary | ICD-10-CM

## 2020-09-26 NOTE — Progress Notes (Signed)
[name], [name] ([id number]) Visit Report for 09/26/2020 Arrival Information Details Patient Name: Date of Service: [facility], [name] 09/26/2020 10:00 A M Medical Record Number: [id number] Patient Account Number: [id number] Date of Birth/Sex: Treating RN: Oct 14, 1955 (65 y.o. [name] Primary Care [name]: [name] Other Clinician: Referring [name]: Treating [name]/Extender: [name] in Treatment: 75 Visit Information History Since Last Visit Added or deleted any medications: No Patient Arrived: Cane Any new allergies or adverse reactions: No Arrival Time: 10:04 Had a fall or experienced change in No Accompanied By: self activities of daily living that may affect Transfer Assistance: None risk of falls: Patient Identification Verified: Yes Signs or symptoms of abuse/neglect since last visito No Secondary Verification Process Completed: Yes Hospitalized since last visit: No Patient Requires Transmission-Based Precautions: No Implantable device outside of the clinic excluding No Patient Has Alerts: Yes cellular tissue based products placed in the center Patient Alerts: L ABI =1.12, TBI = .92 since last visit: R ABI= 1.02, TBI= .58 Has Dressing in Place as Prescribed: Yes Has Compression in Place as Prescribed: Yes Pain Present Now: Yes Electronic Signature(s) Signed: 09/26/2020 5:51:39 PM By: Baruch Gouty RN, BSN Entered By: Baruch Gouty on 09/26/2020 10:05:25 -------------------------------------------------------------------------------- Compression Therapy Details Patient Name: Date of Service: [name] [name], [name] 09/26/2020 10:00 A M Medical Record Number: [id number] Patient Account Number: [id number] Date of Birth/Sex: Treating RN: Aug 30, 1955 (65 y.o. [name] Primary Care [name]: [name] Other Clinician: Referring Necole Minassian: Treating [name]/Extender: [name] in  Treatment: 36 Compression Therapy Performed for Wound Assessment: Wound #15 Left,Medial Malleolus Performed By: Clinician [name] Jackson, RN Compression Type: Rolena Infante Post Procedure Diagnosis Same as Pre-procedure Electronic Signature(s) Signed: 09/26/2020 5:51:39 PM By: Baruch Gouty RN, BSN Entered By: Baruch Gouty on 09/26/2020 10:34:16 -------------------------------------------------------------------------------- Encounter Discharge Information Details Patient Name: Date of Service: [name] [name], [name] 09/26/2020 10:00 A M Medical Record Number: [id number] Patient Account Number: [id number] Date of Birth/Sex: Treating RN: [name] (65 y.o. [name] Primary Care [name]: [name] Other Clinician: Referring [name]: Treating [name]: [name] in Treatment: 68 Encounter Discharge Information Items Post Procedure Vitals Discharge Condition: Stable Temperature (F): 98.1 Ambulatory Status: Cane Pulse (bpm): 57 Discharge Destination: Home Respiratory Rate (breaths/min): 18 Transportation: Private Auto Blood Pressure (mmHg): 149/71 Schedule Follow-up Appointment: Yes Clinical Summary of Care: Provided on 09/26/2020 Form Type Recipient Paper Patient Patient Electronic Signature(s) Signed: 09/26/2020 10:45:37 AM By: [name] Entered By: [name] on 09/26/2020 10:45:37 -------------------------------------------------------------------------------- Lower Extremity Assessment Details Patient Name: Date of Service: [name] [name], [name] 09/26/2020 10:00 A M Medical Record Number: [id number] Patient Account Number: [id number] Date of Birth/Sex: Treating RN: 1955/09/16 (65 y.o. [name] Primary Care Saori Umholtz: [name] Other Clinician: Referring [name]: Treating [name]/Extender: [name] in Treatment: 36 Edema Assessment Assessed: [Left: No] [Right:  No] Edema: [Left: Ye] [Right: s] Calf Left: Right: Point of Measurement: 31 cm From Medial Instep 28 cm Ankle Left: Right: Point of Measurement: 11 cm From Medial Instep 21 cm Vascular Assessment Pulses: Dorsalis Pedis Palpable: [Left:Yes] Electronic Signature(s) Signed: 09/26/2020 5:51:39 PM By: Baruch Gouty RN, BSN Entered By: Baruch Gouty on 09/26/2020 10:12:54 -------------------------------------------------------------------------------- Multi Wound Chart Details Patient Name: Date of Service: [name] [name], [name] 09/26/2020 10:00 [place] Record Number: [id number] Patient Account Number: [id number] Date of Birth/Sex: Treating RN: [birth year] (65 y.o. F) Baruch Gouty  Primary Care [name]: [name] Other Clinician: Referring [name]: Treating Khayri Kargbo/Extender: [name] in Treatment: 36 Vital Signs Height(in): 68 Pulse(bpm): 29 Weight(lbs): Blood Pressure(mmHg): 149/71 Body Mass Index(BMI): Temperature(F): 98.1 Respiratory Rate(breaths/min): 18 Photos: [15:No Photos Left, Medial Malleolus] [N/A:N/A N/A] Wound Location: [15:Gradually Appeared] [N/A:N/A] Wounding Event: [15:Venous Leg Ulcer] [N/A:N/A] Primary Etiology: [15:Peripheral Venous Disease,] [N/A:N/A] Comorbid History: [15:Osteoarthritis, Confinement Anxiety 12/30/2019] [N/A:N/A] Date Acquired: [15:36] [N/A:N/A] Weeks of Treatment: [15:Open] [N/A:N/A] Wound Status: [15:3.8x2.3x0.1] [N/A:N/A] Measurements L x W x D (cm) [15:6.864] [N/A:N/A] A (cm) : rea [15:0.686] [N/A:N/A] Volume (cm) : [15:41.10%] [N/A:N/A] % Reduction in Area: [15:92.60%] [N/A:N/A] % Reduction in Volume: [15:Full Thickness With Exposed Support N/A] Classification: [15:Structures Medium] [N/A:N/A] Exudate Amount: [15:Serosanguineous] [N/A:N/A] Exudate Type: [15:red, brown] [N/A:N/A] Exudate Color: [15:Flat and Intact] [N/A:N/A] Wound Margin: [15:Medium (34-66%)] [N/A:N/A] Granulation  Amount: [15:Red] [N/A:N/A] Granulation Quality: [15:Medium (34-66%)] [N/A:N/A] Necrotic Amount: [15:Fat Layer (Subcutaneous Tissue): Yes N/A] Exposed Structures: [15:Fascia: No Tendon: No Muscle: No Joint: No Bone: No Small (1-33%)] [N/A:N/A] Epithelialization: [15:Cellular or Tissue Based Product] [N/A:N/A] Procedures Performed: [15:Compression Therapy] Treatment Notes Electronic Signature(s) Signed: 09/26/2020 10:52:03 AM By: Kalman Shan DO Signed: 09/26/2020 5:51:39 PM By: Baruch Gouty RN, BSN Entered By: Kalman Shan on 09/26/2020 10:43:47 -------------------------------------------------------------------------------- [place] Details Patient Name: Date of Service: [name] [name], [name] 09/26/2020 10:00 A M Medical Record Number: [id number] Patient Account Number: [id number] Date of Birth/Sex: Treating RN: Aug 24, 1955 (65 y.o. [name] Primary Care Gizzelle Lacomb: [name] Other Clinician: Referring Ledger Heindl: Treating Iasia Forcier/Extender: [name] in Treatment: 43 Multidisciplinary Care Plan reviewed with physician Active Inactive Venous Leg Ulcer Nursing Diagnoses: Actual venous Insuffiency (use after diagnosis is confirmed) Knowledge deficit related to disease process and management Goals: Patient will maintain optimal edema control Date Initiated: 06/20/2020 Target Resolution Date: 10/05/2020 Goal Status: Active Interventions: Assess peripheral edema status every visit. Compression as ordered Treatment Activities: Therapeutic compression applied : 06/20/2020 Notes: Wound/Skin Impairment Nursing Diagnoses: Impaired tissue integrity Knowledge deficit related to ulceration/compromised skin integrity Goals: Patient/caregiver will verbalize understanding of skin care regimen Date Initiated: 01/17/2020 Target Resolution Date: 10/05/2020 Goal Status: Active Ulcer/skin breakdown will have a volume reduction of  30% by week 4 Date Initiated: 01/17/2020 Date Inactivated: 03/12/2020 Target Resolution Date: 03/09/2020 Goal Status: Met Interventions: Assess patient/caregiver ability to obtain necessary supplies Assess patient/caregiver ability to perform ulcer/skin care regimen upon admission and as needed Assess ulceration(s) every visit Provide education on ulcer and skin care Notes: Electronic Signature(s) Signed: 09/26/2020 5:51:39 PM By: Baruch Gouty RN, BSN Entered By: Baruch Gouty on 09/26/2020 10:17:59 -------------------------------------------------------------------------------- Pain Assessment Details Patient Name: Date of Service: [name] [name], [name] 09/26/2020 10:00 A M Medical Record Number: [id number] Patient Account Number: [id number] Date of Birth/Sex: Treating RN: 27-Apr-1956 (65 y.o. [name] Primary Care Kristan Brummitt: [name] Other Clinician: Referring Makiyah Zentz: Treating Phylliss Strege/Extender: [name] in Treatment: 59 Active Problems Location of Pain Severity and Description of Pain Patient Has Paino Yes Site Locations Pain Location: Generalized Pain, Pain in Ulcers With Dressing Change: Yes Duration of the Pain. Constant / Intermittento Intermittent Rate the pain. Current Pain Level: 2 Worst Pain Level: 4 Least Pain Level: 1 Character of Pain Describe the Pain: Aching, Other: tingling Pain Management and Medication Current Pain Management: Medication: Yes Is the Current Pain Management Adequate: Adequate How does your wound impact your activities of daily livingo Sleep: No Bathing: No Appetite: No Relationship With Others: No Bladder Continence:  No Emotions: No Bowel Continence: No Work: No Toileting: No Drive: No Dressing: No Hobbies: No Electronic Signature(s) Signed: 09/26/2020 5:51:39 PM By: Baruch Gouty RN, BSN Entered By: Baruch Gouty on 09/26/2020  10:08:20 -------------------------------------------------------------------------------- Patient/Caregiver Education Details Patient Name: Date of Service: [name] [name], [name] 4/27/2022andnbsp10:00 A M Medical Record Number: [id number] Patient Account Number: [id number] Date of Birth/Gender: Treating RN: 1955-07-13 (65 y.o. [name] Primary Care Physician: [name] Other Clinician: Referring Physician: Treating Physician/Extender: [name] in Treatment: 24 Education Assessment Education Provided To: Patient Education Topics Provided Venous: Methods: Explain/Verbal Responses: Reinforcements needed, State content correctly Wound/Skin Impairment: Methods: Explain/Verbal Responses: Reinforcements needed, State content correctly Electronic Signature(s) Signed: 09/26/2020 5:51:39 PM By: Baruch Gouty RN, BSN Entered By: Baruch Gouty on 09/26/2020 10:32:36 -------------------------------------------------------------------------------- Wound Assessment Details Patient Name: Date of Service: [name] [name], [name] 09/26/2020 10:00 A M Medical Record Number: [id number] Patient Account Number: [id number] Date of Birth/Sex: Treating RN: [birth year] (65 y.o. [name] Primary Care Nahum Sherrer: [name] Other Clinician: Referring Hayven Fatima: Treating Messi Twedt/Extender: [name] in Treatment: 36 Wound Status Wound Number: 15 Primary Venous Leg Ulcer Etiology: Wound Location: Left, Medial Malleolus Wound Status: Open Wounding Event: Gradually Appeared Comorbid Peripheral Venous Disease, Osteoarthritis, Confinement Date Acquired: 12/30/2019 History: Anxiety Weeks Of Treatment: 36 Clustered Wound: No Photos Wound Measurements Length: (cm) 3.8 Width: (cm) 2.3 Depth: (cm) 0.1 Area: (cm) 6.864 Volume: (cm) 0.686 % Reduction in Area: 41.1% % Reduction in Volume: 92.6% Epithelialization: Small  (1-33%) Tunneling: No Undermining: No Wound Description Classification: Full Thickness With Exposed Support Structures Wound Margin: Flat and Intact Exudate Amount: Medium Exudate Type: Serosanguineous Exudate Color: red, brown Foul [name] After Cleansing: No Slough/Fibrino Yes Wound Bed Granulation Amount: Medium (34-66%) Exposed Structure Granulation Quality: Red Fascia Exposed: No Necrotic Amount: Medium (34-66%) Fat Layer (Subcutaneous Tissue) Exposed: Yes Necrotic Quality: Adherent Slough Tendon Exposed: No Muscle Exposed: No Joint Exposed: No Bone Exposed: No Treatment Notes Wound #15 (Malleolus) Wound Laterality: Left, Medial Cleanser Soap and Water Discharge Instruction: May shower and wash wound with dial antibacterial soap and water prior to dressing change. Peri-Wound Care Sween Lotion (Moisturizing lotion) Discharge Instruction: Apply moisturizing lotion as directed Topical Primary Dressing Secondary Dressing Woven Gauze Sponge, Non-Sterile 4x4 in Discharge Instruction: Apply over primary dressing as directed. ABD Pad, 5x9 Discharge Instruction: Apply over primary dressing as directed. Drawtex 4x4 in Discharge Instruction: Apply over primary dressing as directed. Secured With Compression Wrap Unnaboot w/Calamine, 4x10 (in/yd) Discharge Instruction: Apply Unnaboot as directed. Compression Stockings Add-Ons Electronic Signature(s) Signed: 09/26/2020 4:46:04 PM By: [name] Signed: 09/26/2020 5:51:39 PM By: Baruch Gouty RN, BSN Entered By: [name] on 09/26/2020 16:24:07 -------------------------------------------------------------------------------- Vitals Details Patient Name: Date of Service: [name] [name], [name] 09/26/2020 10:00 A M Medical Record Number: [id number] Patient Account Number: [id number] Date of Birth/Sex: Treating RN: [birth year] (65 y.o. [name] Primary Care Ger Ringenberg: [name] Other Clinician: Referring  Hanan Mcwilliams: Treating Jannie Doyle/Extender: [name] in Treatment: 45 Vital Signs Time Taken: 10:05 Temperature (F): 98.1 Height (in): 68 Pulse (bpm): 57 Source: Stated Respiratory Rate (breaths/min): 18 Blood Pressure (mmHg): 149/71 Reference Range: 80 - 120 mg / dl Electronic Signature(s) Signed: 09/26/2020 5:51:39 PM By: Baruch Gouty RN, BSN Entered By: Baruch Gouty on 09/26/2020 10:07:29

## 2020-09-26 NOTE — Progress Notes (Signed)
[name], [name] ([id number]) Visit Report for 09/26/2020 Chief Complaint Document Details Patient Name: Date of Service: [facility] [name], [name] 09/26/2020 10:00 A M Medical Record Number: [id number] Patient Account Number: [id number] Date of Birth/Sex: Treating RN: 1956-01-04 (65 y.o. [name] Provider: [name] Other Clinician: Referring Provider: Treating Provider/Extender: [name] in Treatment: 69 Information Obtained from: Patient Chief Complaint patient is been followed long-term in this clinic for venous insufficiency ulcers with inflammation, hypertension and ulceration over the medial ankle bilaterally. 01/17/2020; this is a patient who is here for review of postoperative wounds on the left lateral ankle and recurrence of venous stasis ulceration on the left medial Electronic Signature(s) Signed: 09/26/2020 10:52:03 AM By: Kalman Shan DO Entered By: Kalman Shan on 09/26/2020 10:44:00 -------------------------------------------------------------------------------- Cellular or Tissue Based Product Details Patient Name: Date of Service: [name], [name] 09/26/2020 10:00 [place] Record Number: [id number] Patient Account Number: [id number] Date of Birth/Sex: Treating RN: 17-Jul-1955 (65 y.o. [name] Provider: [name] Other Clinician: Referring Provider: Treating Provider/Extender: [name] in Treatment: 67 Cellular or Tissue Based Product Type Wound #15 Left,Medial Malleolus Applied to: Performed By: Physician Kalman Shan, DO Cellular or Tissue Based Product Type: Apligraf Level of Consciousness (Pre-procedure): Awake and Alert Pre-procedure Verification/Time Out Yes - 10:34 Taken: Location: trunk / arms / legs Wound Size (sq cm): 8.74 Product Size (sq cm): 44 Waste Size (sq cm): 22 Waste Reason: wound size Amount of Product Applied (sq cm):  22 Instrument Used: Blade, Forceps, Scissors Lot #: [id number] Order #: 2 Expiration Date: 10/09/2020 Fenestrated: Yes Instrument: Blade Reconstituted: Yes Solution Type: saline Solution Amount: saline Lot #: [id number] Solution Expiration Date: 11/30/2021 Secured: Yes Secured With: Steri-Strips Dressing Applied: Yes Primary Dressing: adaptic, drawtex Procedural Pain: 1 Post Procedural Pain: 1 Response to Treatment: Procedure was tolerated well Level of Consciousness (Post- Awake and Alert procedure): Post Procedure Diagnosis Same as Pre-procedure Electronic Signature(s) Signed: 09/26/2020 10:52:03 AM By: Kalman Shan DO Signed: 09/26/2020 5:51:39 PM By: Baruch Gouty RN, BSN Entered By: Baruch Gouty on 09/26/2020 10:37:13 -------------------------------------------------------------------------------- HPI Details Patient Name: Date of Service: [name], [name] 09/26/2020 10:00 A M Medical Record Number: [id number] Patient Account Number: [id number] Date of Birth/Sex: Treating RN: [birth year] (65 y.o. [name] Provider: [name] Other Clinician: Referring Provider: Treating Provider/Extender: [name] in Treatment: 42 History of Present Illness HPI Description: the remaining wound is over the left medial ankle. Similar wound over the right medial ankle healed largely with use of Apligraf. Most recently we have been using Hydrofera Blue over this wound with considerable improvement. The patient has been extensively worked up in the past for her venous insufficiency and she is not a candidate for antireflux surgery although I have none of the details available currently. 08/24/14; considerable improvement today. About 50% of this wound areas now epithelialized. The base of the wound appears to be healthier granulation.as opposed to last week when she had deteriorated a considerable improvement 08/17/14;  unfortunately the wound has regressed somewhat. The areas of epithelialization from the superior aspect are not nearly as healthy as they were last week. The patient thinks her Hydrofera Blue slipped. 09/07/14; unfortunately the area has markedly regressed in the 2 weeks since I've seen this. There is an odor surrounding erythema. The healthy granulation tissue that we had at the base of the wound now  is a dusky color. The nurse reports green drainage 09/14/14; the area looks somewhat better than last week. There is less erythema and less drainage. The culture I did did not show any growth. Nevertheless I think it is better to continue the Cipro and doxycycline for a further week. The remaining wound area was debridement. 09/21/14. Wound did not require debridement last week. Still less erythema and less drainage. She can complete her antibiotics. The areas of epithelialization in the superior aspect of the wound do not look as healthy as they did some weeks ago 10/05/14 continued improvement in the condition of this wound. There is advancing epithelialization. Less aggressive debridement required 10/19/14 continued improvement in the condition and volume of this wound. Less aggressive debridement to the inferior part of this to remove surface slough and fibrinous eschar 11/02/14 no debridement is required. The surface granulation appears healthy although some of her islands of epithelialization seem to have regressed. No evidence of infection 11/16/14; lites surface debridement done of surface eschar. The wound does not look to be unhealthy. No evidence of infection. Unfortunately the patient has had podiatry issues in the right foot and for some reason has redeveloped small surface ulcerations in the medial right ankle. Her original presentation involved wounds in this area 11/23/14 no debridement. The area on the right ankle has enlarged. The left ankle wound appears stable in terms of the surface although  there is periwound inflammation. There has been regression in the amount of new skin 11/30/14 no debridement. Both wound areas appear healthy. There was no evidence of infection. The the new area on the right medial ankle has enlarged although that both the surfaces appear to be stable. 12/07/14; Debridement of the right medial ankle wound. No no debridement was done on the left. 12/14/14 no major change in and now bilateral medial ankle wounds. Both of these are very painful but the no overt evidence of infection. She has had previous venous ablation 12/21/14; patient states that her right medial ankle wound is considerably more painful last week than usual. Her left is also somewhat painful. She could not tolerate debridement. The right medial ankle wound has fibrinous surface eschar 12/28/14 this is a patient with severe bilateral venous insufficiency ulcers. For a considerable period of time we actually had the one on the right medial ankle healed however this recently opened up again in June. The left medial ankle wound has been a refractory area with some absent flows. We had some success with Hydrofera Blue on this area and it literally closed by 50% however it is recently opened up Foley. Both of these were debridement today of surface eschar. She tolerates this poorly 01/25/15: No change in the status of this. Thick adherent escar. Very poor tolerance of any attempt at debridement. I had healed the right medial malleolus wound for a considerable amount of time and had the left one down to about 50% of the volume although this is totally regressed over the last 48 weeks. Further the right leg has reopened. she is trying to make a appointment with pain and vascular, previous ablations with Dr. Aleda Grana. I do not believe there is an arterial insufficiency issue here 02/01/15 the status of the adherent eschar bilaterally is actually improved. No debridement was done. She did not manage to get  vascular studies done 02/08/15 continued debridement of the area was done today. The slough is less adherent and comes off with less pressure. There is no surrounding infection peripheral pulses  are intact 02/15/15 selective debridement with a disposable curette. Again the slough is less adherent and comes off with less difficulty. No surrounding infection peripheral pulses are intact. 02/22/15 selective debridement of the right medial ankle wound. Slough comes off with less difficulty. No obvious surrounding infection peripheral pulses are intact I did not debridement the one on the left. Both of these are stable to improved 03/01/15 selective debridement of both wound areas using a curette to. Adherent slough cup soft with less difficulty. No obvious surrounding infection. The patient tells me that 2 days ago she noted a rash above the right leg wrap. She did not have this on her lower legs when she change this over she arrives with widespread left greater than right almost folliculitis-looking rash which is extremely pruritic. I don't see anything to culture here. There is no rash on the rest of her body. She feels well systemically. 03/08/15; selective debridement of both wounds using a curette. Base of this does not look unhealthy. She had limegreen drainage coming out of the left leg wound and describes a lot of drainage. The rash on her left leg looks improved to. No cultures were done. 03/22/15; patient was not here last week. Basal wounds does not look healthy and there is no surrounding erythema. No drainage. There is still a rash on the left leg that almost looks vasculitic however it is clearly limited to the top of where the wrap would be. 04/05/15; on the right required a surgical debridement of surface eschar and necrotic subcutaneous tissue. I did not debridement the area on the left. These continue to be large open wounds that are not changing that much. We were successful at one point in  healing the area on the right, and at the same time the area on the left was roughly half the size of current measurements. I think a lot of the deterioration has to do with the prolonged time the patient is on her feet at work 04/19/15 I attempted-like surface debridement bilaterally she does not tolerate this. She tells me that she was in allergic care yesterday with extreme pain over her left lateral malleolus/ankle and was told that she has an "sprain" 05/03/15; large bilateral venous insufficiency wounds over the medial malleolus/medial aspect of her ankles. She complains of copious amounts of drainage and his usual large amounts of pain. There is some increasing erythema around the wound on the right extending into the medial aspect of her foot to. historically she came in with these wounds the right one healed and the left one came down to roughly half its current size however the right one is reopened and the left is expanded. 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 05/10/15 large bilateral venous insufficiency wounds. There is less adherence surface left however the surface culture that I did last week grew pseudomonas therefore bilateral selective debridement score necessary. There is surrounding erythema. The patient describes severe bilateral drainage and a lot of pain in the left ankle. Apparently her podiatrist was were ready to do a cortisone shot 05/17/15; the patient complains of pain and again copious amounts of drainage. 05/24/15; we used Iodo flex last week. Patient notes considerable improvement in wound drainage. Only needed to change this once. 05/31/15; we continued Iodoflex; the base of these large wounds bilaterally is not too bad but there is probably likely a significant bioburden here. I would like to  debridement just doesn't tolerate it. 06/06/14 I would like to continue the Iodoflex although she still hasn't managed to  obtain supplies. She has bilateral medial malleoli or large wounds which are mostly superficial. Both of them are covered circumferentially with some nonviable fibrinous slough although she tolerates debridement very poorly. She apparently has an appointment for an ablation on the right leg by interventional radiology. 06/14/15; the patient arrives with the wounds and static condition. We attempted a debridement although she does not do well with this secondary to pain. I 07/05/15; wounds are not much smaller however there appears to be a cleaner granulating base. The left has tight fibrinous slough greater than the right. Debridement is tolerated poorly due to pain. Iodoflex is done more for these wounds in any of the multitude of different dressings I have tried on the left 1 and then subsequently the right. 07/12/15; no change in the condition of this wound. I am able to do an aggressive debridement on the right but not the left. She simply cannot tolerate it. We have been using Iodoflex which helps somewhat. It is worthwhile remembering that at one point we healed the right medial ankle wound and the left was about 25% of the current circumference. We have suggested returning to vascular surgery for review of possible further ablations for one reason or another she has not been able to do this. 07/26/15 no major change in the condition of either wound on her medial ankle. I did not attempt to debridement of these. She has been aggressively scrubbing these while she is in the shower at home. She has her supply of Iodoflex which seems to have done more for these wounds then anything I have put on recently. 08/09/15 wound area appears larger although not verified by measurements. Using Iodoflex 09/05/2015 -- she was here for avisit today but had significant problems with the wound and I was asked to see her for a physician opinion. I have summarize that this lady has had surgery on her left lower extremity  about 10 years ago where the possible veins stripping was done. She has had an opinion from interventional radiology around November 2016 where no further sclerotherapy was ordered. The patient works 12 hours a day and stands on a concrete floor with work boots and is unable to get the proper compression she requires and cannot elevate her limbs appropriately at any given time. She has recently grown Pseudomonas from her wound culture but has not started her ciprofloxacin which was called in for her. 09/13/15 this continues to be a difficult situation for this patient. At one point I had this wound down to a 1.5 x 1.5" wound on her left leg. This is deteriorated and the right leg has reopened. She now has substantial wounds on her medial calcaneus, malleoli and into her lower leg. One on the left has surface eschar but these are far too painful for me to debridement here. She has a vascular surgery appointment next week to see if anything can be done to help here. I think she has had previous ablations several years ago at [facility]. She has no major edema. She tells me that she did not get product last time [facility]] and went for several days without it. She continues to work in work boots 12 hours a day. She cannot get compression/4-layer under her work boots. 09/20/15 no major change. Periwound edema control was not very good. Her point with pain and vascular is next  Wednesday the 25th 09/28/15; the patient is seen vascular surgery and is apparently scheduled for repeat duplex ultrasounds of her bilateral lower legs next week. 10/05/15; the patient was seen by Dr. Doren Custard of vascular surgery. He feels that she should have arterial insufficiency excluded as cause/contributed to her nonhealing stage she is therefore booked for an arteriogram. She has apparently monophasic signals in the dorsalis pedis pulses. She also of course has known severe chronic venous insufficiency with previous procedures as  noted previously. I had another long discussion with the patient today about her continuing to work 12 hour shifts. I've written her out for 2 months area had concerns about this as her work location is currently undergoing significant turmoil and this may lead to her termination. She is aware of this however I agree with her that she simply cannot continue to stand for 12 hours multiple days a week with the substantial wound areas she has. 10/19/15; the Dr. Doren Custard appointment was largely for an arteriogram which was normal. She does not have an arterial issue. He didn't make a comment about her chronic venous insufficiency for which she has had previous ablations. Presumably it was not felt that anything additional could be done. The patient is now out of work as I prescribed 2 weeks ago. Her wounds look somewhat less aggravated presumably because of this. I felt I would give debridement another try today 10/25/15; no major change in this patient's wounds. We are struggling to get her product that she can afford into her own home through her insurance. 11/01/15; no major change in the patient's wounds. I have been using silver alginate as the most affordable product. I spoke to Dr. Marla Roe last week with her requested take her to the OR for surgical debridement and placement of ACEL. Dr. Marla Roe told me that she would be willing to do this however [facility] will not cover this, fortunately the patient has [place] healthcare of some variant 11/08/15; no major change in the patient's wounds. She has been completely nonviable surface that this but is in too much pain with any attempted debridement are clinic. I have arranged for her to see Dr. Marla Roe ham of plastic surgery and this appointment is on Monday. I am hopeful that they will take her to the OR for debridement, possible ACEL ultimately possible skin graft 11/22/15 no major change in the patient's wounds over her bilateral medial  calcaneus medial malleolus into the lower legs. Surface on these does not look too bad however on the left there is surrounding erythema and tenderness. This may be cellulitis or could him sleepy tinea. 11/29/15; no major changes in the patient's wounds over her bilateral medial malleolus. There is no infection here and I don't think any additional antibiotics are necessary. There is now plan to move forward. She sees Dr. Marla Roe in a week's time for preparation for operative debridement and ACEL placement I believe on 7/12. She then has a follow-up appointment with Dr. Marla Roe on 7/21 12/28/15; the patient returns today having been taken to the [facility] by Dr. Marla Roe 12/12/15 she underwent debridement, intraoperative cultures [which were negative]. She had placement of a wound VAC. Parent really ACEL was not available to be placed. The wound VAC foam apparently adhered to the wound since then she's been using silver alginate, Xeroform under Ace wraps. She still says there is a lot of drainage and a lot of pain 01/31/16; this is a patient I see monthly. I had referred  her to Dr. Marla Roe him of plastic surgery for large wounds on her bilateral medial ankles. She has been to the OR twice once in early July and once in early August. She tells me over the last 3 weeks she has been using the wound VAC with ACEL underneath it. On the right we've simply been using silver alginate. Under Kerlix Coban wraps. 02/28/16; this is a patient I'm currently seeing monthly. She is gone on to have a skin graft over her large venous insufficiency ulcer on the left medial ankle. This was done by Dr. Marla Roe him. The patient is a bit perturbed about why she didn't have one on her right medial ankle wound. She has been using silver alginate to this. 03/06/16; I received a phone call from her plastic surgery Dr. Marla Roe. She expressed some concern about the viability of the skin graft she did on the left medial ankle  wound. Asked me to place Endoform on this. She told me she is not planning to do a subsequent skin graft on the right as the left one did not take very well. I had placed Hydrofera Blue on the right 03/13/16; continue to have a reasonably healthy wound on the right medial ankle. Down to 3 mm in terms of size. There is epithelialization here. The area on the left medial ankle is her skin graft site. I suppose the last week this looks somewhat better. She has an open area inferiorly however in the center there appears to be some viable tissue. There is a lot of surface callus and eschar that will eventually need to come off however none of this looked to be infected. Patient states that the is able to keep the dressing on for several days which is an improvement. 03/20/16 no major change in the circumference of either wound however on the left side the patient was at Dr. Eusebio Friendly office and they did a debridement of left wound. 50% of the wound seems to be epithelialized. I been using Endoform on the left Hydrofera Blue in the right 03/27/16; she arrives today with her wound is not looking as healthy as they did last week. The area on the right clearly has an adherent surface to this a very similar surface on the left. Unfortunately for this patient this is all too familiar problem. Clearly the Endoform is not working and will need to change that today that has some potential to help this surface. She does not tolerate debridement in this clinic very well. She is changing the dressing wants 04/03/16; patient arrives with the wounds looking somewhat better especially on the right. Dr. Migdalia Dk change the dressing to silver alginate when she saw her on Monday and also sold her some compression socks. The usefulness of the latter is really not clear and woman with severely draining wounds. 04/10/16; the patient is doing a bit of an experiment wearing the compression stockings that Dr. Migdalia Dk provided her to  her left leg and the out of legs based dressings that we provided to the right. 05/01/16; the patient is continuing to wear compression stockings Dr. Migdalia Dk provided her on the left that are apparently silver impregnated. She has been using Iodoflex to the right leg wound. Still a moderate amount of drainage, when she leaves here the wraps only last for 4 days. She has to change the stocking on the left leg every night 05/15/16; she is now using compression stockings bilaterally provided by Dr. Marla Roe. She is wearing a nonadherent layer  over the wounds so really I don't think there is anything specific being done to this now. She has some reduction on the left wound. The right is stable. I think all healing here is being done without a specific dressing 06/09/16; patient arrives here today with not much change in the wound certainly in diameter to large circular wounds over the medial aspect of her ankle bilaterally. Under the light of these services are certainly not viable for healing. There is no evidence of surrounding infection. She is wearing compression stockings with some sort of silver impregnation as prescribed by Dr. Marla Roe. She has a follow-up with her tomorrow. 06/30/16; no major change in the size or condition of her wounds. These are still probably covered with a nonviable surface. She is using only her purchase stockings. She did see Dr. Marla Roe who seemed to want to apply Dakin's solution to this I'm not extreme short what value this would be. I would suggest Iodoflex which she still has at home. 07/28/16; I follow Mrs. Tarver episodically along with Dr. Marla Roe. 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 She has been applying a topical collagen ointment to both wounds with Adaptic. I don't think Dr. Marla Roe is planning to take her back to the OR. 08/19/16; I follow Mrs. Jeneen Rinks on a monthly basis along with Dr.  Marla Roe of plastic surgery. 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 I been following her for a number of years. At one point I was able to get the right medial malleolus wound to heal and had the left medial malleolus down to about half its current size however and I had to send her to plastic surgery for an operative debridement. Since then things have been stable to slightly improve the area on the right is slightly better one in the left about the same although there is much less adherent surface than I'm used to with this patient. She is using some form of liquid collagen gel that Dr. Marla Roe provided a Kerlix cover with the patient's own pressure stockings. She tells me that she has extreme pain in both ankles and along the lateral aspect of both feet. She has been unable to work for some period of time. She is telling me she is retiring at the beginning of April. She sees Dr. Doran Durand of orthopedics next week 09/22/16; patient has not seen Dr. Marla Roe since the last time she is here. I'm not really sure what she is using to the wounds other than bits and pieces of think she had left over including most recently Hydrofera Blue. She is using juxtalite stockings. She is having difficulty with her husband's recent illness "stroke". She is having to transport him to various doctors appointments. Dr. Marla Roe left her the option of a repeat debridement with ACEL however she has not been able to get the time to follow-up on this. She continues to have a fair amount of drainage out of these wounds with certainly precludes leaving dressings on all week 10/13/16; patient has not seen Dr. Marla Roe since she was last in our clinic. I'm not really sure what she is doing with the wounds, we did try to get her [facility] and I think she is actually using this most of the time. Because of drainage she states she has to change  this every second day although this is an improvement from what she  used to do. She went to see Dr. Doran Durand who did not think she had a muscular issue with regards to her feet, he referred her to a neurologist and I think the appointment is sometime in June. I changed her back to Iodoflex which she has used in the past but not recently. 11/03/16; the patient has been using Iodoflex although she ran out of this. Still claims that there is a lot of drainage although the wound does not look like this. No surrounding erythema. She has not been back to see Dr. Marla Roe 11/24/16; the patient has been using Iodoflex again but she ran out of it 2 or 3 days ago. There is no major change in the condition of either one of these wounds in fact they are larger and covered in a thick adherent surface slough/nonviable tissue especially on the left. She does not tolerate mechanical debridement in our clinic. Going back to see Dr. Marla Roe of plastic surgery for an operative debridement would seem reasonable. 12/15/16; the patient has not been back to see Dr. Marla Roe. She is been dealing with a series of illnesses and her husband which of monopolized her time. She is been using Sorbact which we largely supplied. She states the drainage is bad enough that it maximum she can go 2-3 days without changing the dressing 01/12/2017 -- the patient has not been back for about 4 weeks and has not seen Dr. Marla Roe not does she have any appointment pending. 01/23/17; patient has not seen Dr. Marla Roe even though I suggested this previously. She is using Santyl that was suggested last week by Dr. Con Memos this Cost her $16 through her insurance which is indeed surprising 02/12/17; continuing Santyl and the patient is changing this daily. A lot of drainage. She has not been back to see plastic surgery she is using an Ace wrap. Our intake nurse suggested wrap around stockings which would make a good reasonable  alternative 02/26/17; patient is been using Santyl and changing this daily due to drainage. She has not been to see plastic surgery she uses in April Ace wrap to control the edema. She did obtain extremitease stockings but stated that the edema in her leg was to big for these 03/20/17; patient is using Santyl and Anasept. Surfaces looked better today the area on the right is actually measuring a little smaller. She has states she has a lot of pain in her feet and ankles and is asking for a consult to pain control which I'll try to help her with through our case manager. 04/10/17; the patient arrives with better-looking wound surfaces and is slightly smaller wound on the left she is using a combination of Santyl and Anasept. She has an appointment or at least as started in the pain control center associated with [facility] 05/14/17; this is a patient who I followed for a prolonged period of time. 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 At one point I had this down to a much smaller wound on the left however these reopened and we've never been able to get these to heal. She has been using Santyl and Anasept gel although 2 weeks ago she ran out of the Anasept gel. She has a stable appearance of the wound. She is going to the wound care clinic at [facility]. They wanted do a nerve block/spinal block although she tells me she is reluctant to go forward with that. 05/21/17; this is a patient I have followed for many  years. 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 Chronic pain and deformity in her ankles as well. She is been to see plastic surgery as well as orthopedics. Using PolyMem AG most recently/Kerramax/ABDs and 2 layer compression. She has managed to keep this on and she is coming in for a nurse check to change the dressing on Tuesdays, we see her on Fridays 06/05/17; really quite a good looking surface and the area especially on the  right medial has contracted in terms of dimensions. Well granulated healthy-looking tissue on both sides. Even with an open curet there is nothing that even feels abnormal here. This is as good as I've seen this in quite some time. We have been using PolyMem AG and bringing her in for a nurse check 06/12/17; really quite good surface on both of these wounds. The right medial has contracted a bit left is not. We've been using PolyMem and AG and she is coming in for a nurse visit 06/19/17; we have been using PolyMem AG and bringing her in for a nurse check. Dimensions of her wounds are not better but the surfaces looked better bilaterally. She complained of bleeding last night and the left wound and increasing pain bilaterally. She states her wound pain is more neuropathic than just the wounds. There was some suggestion that this was radicular from her pain management doctor in talking to her it is really difficult to sort this out. 06/26/17; using PolyMem and AG and bringing her in for a nurse check as All of this and reasonably stable condition. Certainly not improved. The dimensions on the lateral part of the right leg look better but not really measuring better. The medial aspect on the left is about the same. 07/03/16; we have been using PolyMen AG and bringing her in for a nurse check to change the dressings as the wounds have drainage which precludes once weekly changing. We are using all secondary absorptive dressings.our intake nurse is brought up the idea of using a wound VAC/snap VAC on the wound to help with the drainage to see if this would result in some contraction. This is not a bad idea. The area on the right medial is actually looking smaller. Both wounds have a reasonable-looking surface. There is no evidence of cellulitis. The edema is well controlled 07/10/17; the patient was denied for a snap VAC by her insurance. The major issue with these wounds continues to be drainage. We are using  wicked PolyMem AG and she is coming in for a nurse visit to change this. The wounds are stable to slightly improved. The surface looks vibrant and the area on the right certainly has shrunk in size but very slowly 07/17/17; the patient still has large wounds on her bilateral medial malleoli. Surface of both of these wounds looks better. The dimensions seem to come and go but no consistent improvement. There is no epithelialization. We do not have options for advanced treatment products due to insurance issues. They did not approve of the wound VAC to help control the drainage. More recently we've been using PolyMem and AG wicked to allow drainage through. We have been bringing her in for a nurse visit to change this. We do not have a lot of options for wound care products and the home again due to insurance issues 07/24/17; the patient's wound actually looks somewhat better today. No drainage measurements are smaller still healthy-looking surface. We used silver collagen under PolyMen started last week. We have  been bringing her in for a dressing change 07/31/17; patient's wound surface continued to look better and I think there is visible change in the dimensions of the wound on the right. Rims of epithelialization. We have been using silver collagen under PolyMen and bringing her in for a dressing change. There appears to be less drainage although she is still in need of the dressing change 08/07/17. Patient's wound surface continues to look better on both sides and the area on the right is definitely smaller. We have been using silver collagen and PolyMen. She feels that the drainage has been it has been better. I asked her about her vascular status. She went to see Dr. Aleda Grana at [facility] and had some form of ablation. I don't have much detail on this. I haven't my notes from 2016 that she was not a candidate for any further ablation but I don't have any more information on this. We had  referred her to vein and vascular I don't think she ever went. He does not have a history of PAD although I don't have any information on this either. We don't even have ABIs in our record 08/14/17; we've been using silver collagen and PolyMen cover. And putting the patient and compression. She we are bringing her in as a nurse visit to change this because ofarge amount of drainage. We didn't the ABIs in clinic today since they had been done in many moons 1.2 bilaterally. She has been to see vein and vascular however this was at [facility] and she had ablation although I really don't have any information on this all seemed biking get a report. She is also been operatively debrided by plastic surgery and had a cell placed probably 8-12 months ago. This didn't have a major effect. We've been making some gains with current dressings 08/19/17-She is here in follow-up evaluation for bilateral medial malleoli ulcers. She continues to tolerate debridement very poorly. We will continue with recently changed topical treatment; if no significant improvement may consider switching to Iodosorb/Iodoflex. 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 She has been using silver collagen and PolyMem. I believe she has been used and tried on Iodoflex before. 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 These have been very refractory over time. We have been using silver collagen and PolyMen. She is coming in once a week for a doctor's and once a week for nurse visits. We are actually making some progress 09/18/17; the patient's wounds are smaller especially on the right medial. She arrives today to upset to consider even washing these off with Anasept which I think is been part of the  reason this is been closing. We've been using collagen covered in PolyMen otherwise. It is noted that she has a small area of folliculitis on the right medial calf that. As we are wrapping her legs I'll give her a short course of doxycycline to make sure this doesn't amount to anything. She is a long list of complaints today including imbalance, shortness of breath on exertion, inversion of her left ankle. With regards to the latter complaints she is been to see orthopedics and they offered her a tendon release surgery I believe but wanted her wounds to be closed first. I have recommended she go see  her primary physician with regards to everything else. 09/25/17; patient's wounds are about the same size. We have made some progress bilaterally although not in recent weeks. She will not allow me T wash these wounds with Anasept even if she is doing her cell. Wheeze we've been using collagen covered in PolyMen. Last week she had a small area of folliculitis this is now opened into a small wound. She completed 5 days of trimethoprim sulfamethoxazole 10/02/17; unfortunately the area on her left medial ankle is worse with a larger wound area towards the Achilles. The patient complains of a lot of pain. She will not allow debridement although visually I don't think there is anything to debridement in any case. We have been using silver collagen and PolyMen for several months now. Initially we are making some progress although I'm not really seeing that today. We will move back to [facility]. His admittedly this is a bit of a repeat however I'm hoping that his situation is different now. The patient tells me she had her leg on the left give out on her yesterday this is process some pain. 10/09/17; the patient is seen twice a week largely because of drainage issues coming out of the chronic medial bimalleolar wounds that are chronic. Last week the dimensions of the one on the left looks a little larger I  changed her to [facility] Iv. She comes in today with a history of terrible pain in the bilateral wound areas. She will not allow debridement. She will not even allow a tissue culture. There is no surrounding erythema no no evidence of cellulitis. We have been putting her Kerlix Coban man. She will not allow more aggressive compression as there was a suggestion to put her in 3 layer wraps. 10/16/17; large wounds on her bilateral medial malleoli. These are chronic. Not much change from last week. The surface looks have healthy but absolutely no epithelialization. A lot of pain little less so of drainage. She will not allow debridement or even washing these off in the vigorous fashion with Anasept. 10/23/17; large wounds on her bilateral malleoli which are chronic. Some improvement in terms of size perhaps on the right since last time I saw these. She states that after we increased the 3 layer compression there was some bleeding, when she came in for a nurse visit she did not want 3 layer compression put back on about our nurse managed to convince her. She has known chronic venous visit issues and I'm hoping to get her to tolerate the 3 layer compression. using Hydrofera Blue 10/30/17; absolutely no change in the condition of either wound although we've had some improvement in dimensions on the right.. Attempted to put her in 3 layer compression she didn't tolerated she is back in 2 layer compression. We've been using Hydrofera Blue We looked over her past records. She had venous reflux studies in November 2016. There was no evidence of deep venous reflux on the right. Superficial vein did not show the greater saphenous vein at think this is been previously ablated the small saphenous vein was within normal limits. The left deep venous system showed no DVT the vessels were positive for deep venous reflux in the posterior tibial veins at the ankle. The greater saphenous vein was surgically absent small  saphenous vein was within normal limits. She went to vein and vascular at [facility]. I believe she had an ablation on the left greater saphenous vein. I'll update her reflux studies perhaps ever  reviewed by vein and vascular. We've made absolutely no progress in these wounds. Will also try to read and TheraSkins through her insurance 11/06/17; W the patient apparently has a 2 week follow-up with vein and vascular I like him to review the whole issue with regards to her previous vascular workup by Dr. Aleda Grana. We've really made no progress on these wounds in many months. She arrives today with less viable looking surface on the left medial ankle wound. This was apparently looking about the same on Tuesday when she was here for nurse visit. 11/13/17; deep tissue culture I did last time of the left lower leg showed multiple organisms without any predominating. In particular no Staphylococcus or group A strep were isolated. We sent her for venous reflux studies. She's had a previous left greater saphenous vein stripping and I think sclerotherapy of the right greater saphenous vein. She didn't really look at the lesser saphenous vein this both wounds are on the medial aspect. She has reflux in the common femoral vein and popliteal vein and an accessory vein on the right and the common femoral vein and popliteal vein on the left. I'm going to have her go to see vein and vascular just the look over things and see if anything else beside aggressive compression is indicated here. We have not been able to make any progress on these wounds in spite of the fact that the surface of the wounds is never look too bad. 11/20/17; no major change in the condition of the wounds. Patient reports a large amount of drainage. She has a lot of complaints of pain although enlisting her today I wonder if some of this at least his neuropathic rather than secondary to her wounds. She has an appointment with vein and vascular  on 12/30/17. The refractory nature of these wounds in my mind at least need vein and vascular to look over the wounds the recent reflux studies we did and her history to see if anything further can be done here. I also note her gait is deteriorated quite a bit. 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 She has a bilateral Trendelenburg gait. I wonder if this is neuropathic or perhaps multilevel radicular. 11/27/17; her wounds actually looks slightly better. Healthy-looking granulation tissue a scant amount of epithelialization. [place] healthcare will not pay for [employer]. They will play for tri layer Oasis and Dermagraft. This is not a diabetic ulcer. We'll try for the tri layer Oasis. She still complains of some drainage. She has a vein and vascular appointment on 12/30/17 12/04/17; the wounds visually look quite good. Healthy-looking granulation with some degree of epithelialization. We are still waiting for response to our request for trial to try layer Oasis. Her appointment with vascular to review venous and arterial issues isn't sold the end of July 7/31. Not allow debridement or even vigorous cleansing of the wound surface. 12/18/17; slightly smaller especially on the right. 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 We've been using Hydrofera Blue. We still are looking into triple layer Oasis through her insurance 01/08/18 on evaluation today patient's wound actually appears to be showing signs of good improvement at this point in time. She has been tolerating the dressing changes without complication. Fortunately there does not appear to be any evidence of infection at this point in time. We have been utilizing silver nitrate which does seem to be of benefit for her which is also good news. Overall I'm very happy with  how things seem to be both regards appearance as well as measurement. Patient did see Dr. Bridgett Larsson for evaluation on 12/30/17. In his assessment he  felt that stripping would not likely add much more than chronic compression to the patient's healing process. His recommendation was to follow-up in three months with Dr. Doren Custard if she hasn't healed in order to consider referral back to you and see vascular where she previously was in a trial and was able to get her wound to heal. I'll be see what she feels she when you staying compression and he reiterated this as well. 01/13/18 on evaluation today patient appears to actually be doing very well in regard to her bilateral medial malleolus ulcers. She seems to have tolerated the chemical cauterization with silver nitrate last week she did have some pain through that evening but fortunately states that I'll be see since it seems to be doing better she is overall pleased with the progress. 01/21/18; really quite a remarkable improvement since I've last seen these wounds. We started using silver nitrate specially on the islands of hyper granulation which for some reason her around the wound circumference. This is really done quite nicely. Primary dressing Hydrofera Blue under 4 layer compression. She seems to be able to hold out without a nurse rewrap. Follow-up in 1 week 01/28/18; we've continued the hydrofera blue but continued with chemical cauterization to the wound area that we started about a month ago for irregular hyper granulation. She is made almost stunning improvement in the overall wound dimensions. I was not really expecting this degree of improvement in these chronic wounds 02/05/18; we continue with Hydrofera Bluebut of also continued the aggressive chemical cauterization with silver nitrate. We made nice progress with the right greater than left wound. 02/12/18. We continued with Hydrofera Blue after aggressive chemical cauterization with silver nitrate. We appear to be making nice progress with both wound areas 02/19/2018; we continue with [facility] after washing the wounds vigorously  with Anasept spray and chemical cauterization with silver nitrate. We are making excellent progress. The area on the right's just about closed 02/26/2018. The area on the left medial ankle had too much necrotic debris today. I used a #5 curette we are able to get most of the soft. I continued with the silver nitrate to the much smaller wound on the right medial ankle she had a new area on her right lower pretibial area which she says was due to a role in her compression 03/05/2018; both wound areas look healthy. Not much change in dimensions from last week. I continue to use silver nitrate and Hydrofera Blue. The patient saw Dr. Doren Custard of vein and vascular. He felt she had venous stasis ulcers. He felt based on her previous arteriogram she should have adequate circulation for healing. Also she has deep venous reflux but really no significant correctable superficial venous reflux at this time. He felt we should continue with conservative management including leg elevation and compression 04/02/2018; since we last saw this woman about a month ago she had a fall apparently suffered a pelvic fracture. I did not look up the x-ray. Nevertheless because of pain she literally was bedbound for 2 weeks and had home health coming out to change the dressing. Somewhat predictably this is resulted in considerable improvement in both wound areas. The right is just about closed on the medial malleolus and the left is about half the size. 04/16/2018; both her wounds continue to go down in size. Using  Hydrofera Blue. 05/07/18; both her wounds appeared to be improving especially on the right where it is almost closed. We are using Hydrofera Blue 05/14/2018; slightly worse this week with larger wounds. Surface on the left medial not quite as good. We have been using Hydrofera Blue 05/21/18; again the wounds are slightly larger. Left medial malleolus slightly larger with eschar around the circumference. We have been using  Hydrofera Blue undergoing a wraps for a prolonged period of time. This got a lot better when she was more recumbent due to a fall and a back injury. I change the primary dressing the silver alginate today. She did not tolerate a 4 layer compression previously although I may need to bring this up with her next time 05/28/2018; area on the left medial malleolus again is slightly larger with more drainage. Area on the right is roughly unchanged. She has a small area of folliculitis on the right medial just on the lower calf. This does not look ominous. 06/03/2018 left medial malleolus slightly smaller in a better looking surface. We used silver nitrate on this last time with silver alginate. The area on the right appears slightly smaller 1/10; left medial malleolus slightly smaller. Small open area on the right. We used silver nitrate and silver alginate as of 2 weeks ago. We continue with the wound and compression. These got a lot better when she was off her feet 1/17; right medial malleolus wound is smaller. The left may be slightly smaller. Both surfaces look somewhat better. 1/24; both wounds are slightly smaller. Using silver alginate under Unna boots 1/31; both wounds appear smaller in fact the area on the right medial is just about closed. Surface eschar. We have been using silver alginate under Unna boots. The patient is less active now spends let much less time on her feet and I think this is contributed to the general improvement in the wound condition 2/7; both wounds appear smaller. I was hopeful the right medial would be closed however there there is still the same small open area. Slight amount of surface eschar on the left the dimensions are smaller there is eschar but the wound edges appear to be free. We have been using silver alginate under Unna boot's 2/14; both wounds once again measure smaller. Circumferential eschar on the left medial. We have been using silver alginate under Unna  boots with gradual improvement 2/21; the area on the right medial malleolus has healed. The area on the left is smaller. We have been using silver alginate and Unna boots. We can discharge wrapping the right leg she has 20/30 stockings at home she will need to protect the scar tissue in this area 2/28; the area on the right medial malleolus remains closed the patient has a compression stocking. The area on the left is smaller. We have been using silver alginate and Unna boots. 3/6 the area on the right medial ankle remains closed. Good edema control noted she is using her own compression stocking. The area on the left medial ankle is smaller. We have been managing this with silver alginate and Unna boots which we will continue today. 3/13; the area on the right medial ankle remains closed and I'm declaring it healed today. When necessary the left is about the same still a healthy-looking surface but no major change and wound area. No evidence of infection and using silver alginate under unna and generally making considerable improvement 3/27 the area on the right medial ankle remains closed the area  on the left is about the same as last week. Certainly not any worse we have been using silver alginate under an Unna boot 4/3; the area on the right medial ankle remains closed per the patient. We did not look at this wound. The wound on the left medial ankle is about the same surface looks healthy we have been using silver alginate under an Unna boot 4/10; area on the right medial ankle remains closed per the patient. We did not look at this wound. The wound on the left medial ankle is slightly larger. The patient complains that the [facility] caused burning pain all week. She also told us that she was a lot more active this week. Changed her back to silver alginate 4/17; right medial ankle still closed per the patient. Left medial ankle is slightly larger. Using silver alginate. She did not  tolerate Hydrofera Blue on this area 4/24; right medial ankle remains closed we have not look at this. The left medial ankle continues to get larger today by about a centimeter. We have been using silver alginate under Unna boots. She complains about 4 layer compression as an alternative. She has been up on her feet working on her garden 5/8; right medial ankle remains closed we did not look at this. The left medial ankle has increased in size about 100%. We have been using silver alginate under Unna boots. She noted increased pain this week and was not surprised that the wound is deteriorated 5/15; no major change in SA however much less erythema ( one week of doxy ocellulitis). 5/22-65 year old female returns at 1 week to the clinic for left medial ankle wound for which we have been using silver alginate under 3 layer compression She was placed on DOXY at last visit - the wound is wider at this visit. She is in 3 layer compression 5/29; change to [address] [employer] last week. I had given her empiric doxycycline 2 weeks ago for a week. She is in 3 layer compression. She complains of a lot of pain and drainage on presentation today. 6/5; using Hydrofera Blue. I gave her doxycycline recently empirically for erythema and pain around the wound. Believe her cultures showed enterococcus which not would not have been well covered by doxycycline nevertheless the wound looks better and I don't feel specifically that the enterococcus needs to be covered. She has a new what looks like a wrap injury on her lateral left ankle. 6/12; she is using Hydrofera Blue. She has a new area on the left anterior lower tibial area. This was a wrap injury last week. 6/19; the patient is using Hydrofera Blue. She arrived with marked inflammation and erythema around the wound and tenderness. 12/01/18 on evaluation today patient appears to be doing a little bit better based on what I'm hearing from the standpoint of lassos evaluation  to this as far as the overall appearance of the wound is concerned. Then sometime substandard she typically sees Dr. Dellia Nims. Nonetheless overall very pleased with the progress that she's made up to this point. No fevers, chills, nausea, or vomiting noted at this time. 7/10; some improvement in the surface area. Aggressively debrided last week apparently. I went ahead with the debridement today although the patient does not tolerate this very well. We have been using Iodoflex. Still a fair amount of drainage 7/17; slightly smaller. Using Iodoflex. 7/24; no change from last week in terms of surface area. We have been using Iodoflex. Surface looks and continues to  look somewhat better 7/31; surface area slightly smaller better looking surface. We have been using Iodoflex. This is under Unna boot compression 8/7-Patient presents at 1 week with Unna boot and Iodoflex, wound appears better 8/14-Patient presents at 1 week with Iodoflex, we use the Unna boot, wound appears to be stable better.Patient is getting Botox treatment for the inversion of the foot for tendon release, Next week 8/21; we are using Iodoflex. Unna boot. The wound is stable in terms of surface area. Under illumination there is some areas of the wound that appear to be either epithelialized or perhaps this is adherent slough at this point I was not really clear. It did not wipe off and I was reluctant to debride this today. 8/28; we are using Iodoflex in an Unna boot. Seems to be making good improvement. 9/4; using Iodoflex and wound is slightly smaller. 9/18; we are using Iodoflex with topical silver nitrate when she is here. The wound continues to be smaller 10/2; patient missed her appointment last week due to GI issues. She left and Iodoflex based dressing on for 2 weeks. Wound is about the same size about the size of a dime on the left medial lower 10/9 we have been using Iodoflex on the medial left ankle wound. She has a new  superficial probable wrap injury on the dorsal left ankle 10/16; we have been using Hydrofera Blue since last week. This is on the left medial ankle 10/23; we have been using Hydrofera Blue since 2 weeks ago. This is on the left medial ankle. Dimensions are better 11/6; using Hydrofera Blue. I think the wound is smaller but still not closed. Left medial ankle 11/13; we have been using Hydrofera Blue. Wound is certainly no smaller this week. Also the surface not as good. This is the remanent of a very large area on her left medial ankle. 11/20; using Sorbact since last week. Wound was about the same in terms of size although I was disappointed about the surface debris 12/11; 3-week follow-up. Patient was on vacation. Wound is measuring slightly larger we have been using Sorbact. 12/18; wound is about the same size however surface looks better last week after debridement. We have been using Sorbact under compression 1/15 wound is probably twice the size of last time increased in length nonviable surface. We have been using Sorbact. She was running a mild fever and missed her appointment last week 1/22; the wound is come down in size but under illumination still a very adherent debris we have been Hydrofera Blue that I changed her to last week 1/29; dimensions down slightly. We have been using Hydrofera Blue 2/19 dimensions are the same however there is rims of epithelialization under illumination. Therefore more the surface area may be epithelialized 2/26; the patient's wound actually measures smaller. The wound looks healthy. We have been using Hydrofera Blue. I had some thoughts about running Apligraf then I still may do that however this looks so much better this week we will delay that for now 3/5; the wound is small but about the same as last week. We have been using Hydrofera Blue. No debridement is required today. 3/19; the wound is about the size of a dime. Healthy looking wound even under  illumination. We have been using Hydrofera Blue. No mechanical debridement is necessary 3/26; not much change from last week although still looks very healthy. We have been using Hydrofera Blue under Unna boots Patient was offered an ankle fusion by podiatry but not  until the wound heals with a proceed with this. 4/9; the patient comes in today with her original wound on the medial ankle looking satisfactory however she has some uncontrolled swelling in the middle part of her leg with 2 new open areas superiorly just lateral to the tibia. I think this was probably a wrap issue. She said she felt uncomfortable during the week but did not call in. We have been using Hydrofera Blue 4/16; the wound on the medial ankle is about the same. She has innumerable small areas superior to this across her mid tibia. I think this is probably folliculitis. She is also been working in the yard doing a lot of sweating 4/30; the patient issue on the upper areas across her mid tibia of all healed. I think this was excessive yard work if I remember. Her wound on the medial ankle is smaller. Some debris on this we have been using Hydrofera Blue under Unna boots 5/7; mid tibia. She has been using Hydrofera Blue under an Unna wrap. She is apparently going for her ankle surgery on June 3 10/28/19-Patient returns to clinic with the ankle wound, we are using Hydrofera Blue under Unna wrap, surgery is scheduled for her left foot for June 3 so she will be back for nurse visit next week READMISSION 01/17/2020 Mrs. Uscanga is a 65 year old woman we have had in this clinic for a long period of time with severe venous hypertension and refractory wounds on her medial lower legs and ankles bilaterally. 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 When she was off her legs for a prolonged period example when she fell  and suffered a compression fracture things would heal up quite nicely. She is now retired and we managed to heal up the right medial leg wound. The left one was very tiny last time I saw this although still refractory. 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 It got to the point where this was interfering with her walking and she elected to proceed with a ankle arthrodesis to straighten her her ankle and leave her with a functional outcome for mobilization. The patient was referred to Dr. Doren Custard and really this took some time to arrange. Dr. Doren Custard saw her on 12/07/2019. Once again he verified that she had no arterial issues. She had previously had an angiogram several years ago. Follow-up ABIs on the left showed an ABI of 1.12 with triphasic waveforms and a TBI of 0.92. She is felt to have chronic deep venous insufficiency but I do not think it was felt that anything could be done from about this from an ablation point of view. At the time Dr. Doren Custard saw this patient the wounds actually look closed via the pictures in his clinic. The patient finally underwent her surgery on 12/15/2019. This went reasonably well and there was a good anatomic outcome. She developed a small distal wound dehiscence on the lateral part of the surgical wound. However more problematically she is developed recurrence of the wound on the medial left ankle. There are actually 2 wounds here one in the distal lower leg and 1 pretty much at the level of the medial malleolus. It is a more distal area that is more problematic. She has been using Hydrofera Blue which started on  Friday before this she was simply Ace wrapping. There was a culture done that showed Pseudomonas and she is on ciprofloxacin. A recent CNS on 8/11 was negative. The patient reports some pain but I generally think this is improving. She is using a cam boot completely  nonweightbearing using a walker for pivot transfers and a wheelchair 8/24; not much improvement unfortunately she has a surgical wound on the lateral part in the venous insufficiency wound medially. The bottom part of the medial insufficiency wound is still necrotic there is exposed tendon here. We have been using Hydrofera Blue under compression. Her edema control is however better 8/31; patient in for follow-up of his surgical wound on the lateral part of her left leg and chronic venous insufficiency ulcers medially. We put her back in compression last week. She comes in today with a complaint of 3 or 4 days worth of increasing pain. She felt her cam walker was rubbing on the area on the back of her heel. However there is intense erythema seems more likely she has cellulitis. She had 2 cultures done when she was seeing podiatry in the postop. One of them in late July showed Pseudomonas and she received a course of ciprofloxacin the other was negative on 8/11 she is allergic to penicillin with anaphylactoid complaints of hives oral swelling via information in epic 9/9; when I saw this patient last week she had intense anterior erythema around her wound on the right lateral heel and ankle and also into the right medial heel. Some of this was no doubt drainage and her walker boot however I was convinced she had cellulitis. I gave her Levaquin and Bactrim she is finishing up on this now. She is following up with Dr. [name] he saw her yesterday. He is taken her out of the walking boot of course she is still nonweightbearing. Her x-ray was negative for any worrisome features such as soft tissue air etc. Things are a lot better this week. She has home health. We have been using Hydrofera Blue under an [employer] which she put back on yesterday. I did not wrap her last week 9/17; her surrounding skin looks a lot better. In fact the area on the left lateral ankle has just a scant amount of eschar. The only  remaining wound is the large area on the left medial ankle. Probably about 60% of this is healthy granulation at the surface however she has a significant divot distally. This has adherent debris in it. I been using debridement and silver collagen to try and get this area to fill-in although I do not think we have made much progress this week 9/24; the patient's wound on the left medial ankle looks a lot better. The deeper divot area distally still requires debridement but this is cleaning up quite nicely we have been using silver collagen. The patient is complaining of swelling in her foot and is worried that that is contributing to the nonhealing of the ankle wound. She is also complaining of numbness in her anterior toes 10/4; left medial ankle. The small area distally still has a divot with necrotic material that I have been debriding away. This has an undermining area. She is approved for Apligraf. She saw Dr. [name] her surgeon on 10/1. I think he declared himself is satisfied with the condition of things. Still nonweightbearing till the end of the month. 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 Her surgical wound is well closed.  There is no evidence of infection 10/11; the patient arrived in clinic today with the expectation that we be able to put an Apligraf on this area after debridement however she arrives with a relatively large amount of green drainage on the dressing. The patient states that this started on Friday. She has not been systemically unwell. 10/19; culture I did last week showed both Enterococcus and Pseudomonas. I think this came in separate parts because I stopped her ciprofloxacin I gave her and prescribed her linezolid however now looking at the final culture result this is Pseudomonas which is resistant to quinolones. She has not yet picked up the linezolid apparently phone issues. We are also trying to get a topical antibiotic out of  [facility] in [place] they can be applied by home health. She is still having green drainage 10/16; the patient has her topical antibiotic from [facility] in [place]. This is a compounded gel with vancomycin and ciprofloxacin and gentamicin. We are applying this on the wound bed with silver alginate over the top with Unna boot wraps. She arrives in clinic today with a lot less ominous looking drainage although she is only use this topical preparation once the second time today. She sees Dr. [name] her surgeon on Friday she has home health changing the dressing 11/2; still using her compounded topical antibiotic under silver alginate. Surface is cleaning up there is less drainage. We had an Apligraf for her today and I elected to apply it. A light coating of her antibiotic 04/25/2020 upon evaluation today patient appears to be doing well with regard to her ankle ulcer. There is a little bit of slough noted on the surface of the wound I am can have to perform sharp debridement to clear this away today. With that being said other than that fact overall I feel like she is making progress and we do see some new epithelial growth. There is also some improvement in the depth of the wound and that distal portion. There is little bit of slough there as well. 12/7; 2-week follow-up. Apligraf #3. Dimensions are smaller. Closing in especially inferiorly. Still some surface debris. Still using the [facility] topical antibiotic but I told her that I don't think this needs to be renewed 12/21; 2-week follow-up. Apligraf #4 dimensions are smaller. Nice improvement 06/05/2020; 2-week follow-up. The patient's wound on the left medial ankle looks really excellent. Nice granulation. Advancing epithelialization no undermining no evidence of infection. We would have to reapply for another Apligraf but with the condition of this wound I did not feel strongly about it. We used Hydrofera Blue under the same degree  of compression. She follows up with Dr. [name] her surgeon a week Friday 06/13/2020 upon evaluation today patient appears to be doing excellent in regard to her wound. She has been tolerating the dressing changes without complication. Fortunately there is no signs of active infection at this time. No fevers, chills, nausea, vomiting, or diarrhea. She was using Hydrofera Blue last week. 06/20/2020 06/20/2020 on evaluation today patient actually appears to be doing excellent in regard to her wound. This is measuring better and looking much better as well. She has been using the collagen that seems to be doing better for her as well even though the [facility] was and is not sticking or feeling as rough on her wound. She did see Dr. [name] on Friday he is very pleased he also stated none of the hardware has shifted. That is great news 1/27; the  patient has a small clean wound all that is remaining. I agree that this is too small to really consider an Apligraf. Under illumination the surface is looking quite good. We have been using collagen although the dimensions are not any better this week 2/2; the patient has a small clean wound on the left medial ankle. Although this left of her substantial original areas. Measurements are smaller. We have been using polymen Ag under an Haematologist. 2/10; small area on the left medial ankle. This looks clean nothing to debride however dimensions are about the same we have been using polymen I think now for 2 weeks 2/17; not much change in surface area. We have been using polymen Ag without any improvement. 3/17; 1 month follow-up. The patient has been using endoform without any improvement in fact I think this looks worse with more depth and more expansion 3/24; no improvement. Perhaps less debris on the surface. We have been using Sorbact for 1 week 4/4; wound measures larger. She has edema in her leg and her foot which she tells as her wrap came down. We have been  using Unna boots. Sorbact of the wound. She has been approved for Apligraf 09/12/2020 upon evaluation today patient appears to be doing well with regard to her wound. We did get the Apligraf reapproved which is great news we have that available for application today. Fortunately there is no signs of infection and overall the patient seems to be doing great. The wound bed is nice and clean. 4/27; patient presents for her second application of Apligraf. She states over the past week she has been on her feet more often due to being outside in her garden. She has noted more swelling to her foot as a result. She denies increased warmth, pain or erythema to the wound site. Electronic Signature(s) Signed: 09/26/2020 10:52:03 AM By: Kalman Shan DO Entered By: Kalman Shan on 09/26/2020 10:46:19 -------------------------------------------------------------------------------- Physical Exam Details Patient Name: Date of Service: [name], [name] 09/26/2020 10:00 A M Medical Record Number: [id number] Patient Account Number: [id number] Date of Birth/Sex: Treating RN: December 20, 1955 (65 y.o. [name] Provider: [name] Other Clinician: Referring Provider: Treating Provider/Extender: [name] in Treatment: 67 Constitutional respirations regular, non-labored and within target range for patient.. Cardiovascular 2+ dorsalis pedis/posterior tibialis pulses. Psychiatric pleasant and cooperative. Notes Left foot: Medial malleolus wound with granulation tissue present. Apligraf appears to be incorporating. No signs of infection. There is swelling to the foot, increased from last clinic visit. Electronic Signature(s) Signed: 09/26/2020 10:52:03 AM By: Kalman Shan DO Entered By: Kalman Shan on 09/26/2020 10:47:41 -------------------------------------------------------------------------------- Physician Orders Details Patient Name: Date  of Service: [name], [name] 09/26/2020 10:00 A M Medical Record Number: [id number] Patient Account Number: [id number] Date of Birth/Sex: Treating RN: 12/06/1955 (65 y.o. [name] Provider: [name] Other Clinician: Referring Provider: Treating Provider/Extender: [name] in Treatment: 8 Verbal / Phone Orders: No Diagnosis Coding ICD-10 Coding Code Description [phone] Chronic venous hypertension (idiopathic) with ulcer and inflammation of left lower extremity L97.828 Non-pressure chronic ulcer of other part of left lower leg with other specified severity L97.328 Non-pressure chronic ulcer of left ankle with other specified severity Follow-up Appointments ppointment in 2 weeks. - MD for next apligraf Wed. Return A Nurse Visit: - 1 week post apligraf Cellular or Tissue Based Products daptic or Mepitel. (DO NOT REMOVE). - Cellular or Tissue Based Product  applied to wound bed, secured with steri-strips, cover with A Apligraf #2 Bathing/ Shower/ Hygiene May shower with protection but do not get wound dressing(s) wet. Edema Control - Lymphedema / SCD / Other Elevate legs to the level of the heart or above for 30 minutes daily and/or when sitting, a frequency of: - throughout the day Avoid standing for long periods of time. Exercise regularly Compression stocking or Garment 20-30 mm/Hg pressure to: - right leg daily Wound Treatment Wound #15 - Malleolus Wound Laterality: Left, Medial Cleanser: Soap and Water 1 x Per Week Discharge Instructions: May shower and wash wound with dial antibacterial soap and water prior to dressing change. Peri-Wound Care: Sween Lotion (Moisturizing lotion) 1 x Per Week Discharge Instructions: Apply moisturizing lotion as directed Secondary Dressing: Woven Gauze Sponge, Non-Sterile 4x4 in 1 x Per Week Discharge Instructions: Apply over primary dressing as directed. Secondary Dressing: ABD Pad,  5x9 1 x Per Week Discharge Instructions: Apply over primary dressing as directed. Secondary Dressing: Drawtex 4x4 in 1 x Per Week Discharge Instructions: Apply over primary dressing as directed. Compression Wrap: Unnaboot w/Calamine, 4x10 (in/yd) 1 x Per Week Discharge Instructions: Apply Unnaboot as directed. Electronic Signature(s) Signed: 09/26/2020 10:52:03 AM By: Kalman Shan DO Entered By: Kalman Shan on 09/26/2020 10:47:55 -------------------------------------------------------------------------------- Problem List Details Patient Name: Date of Service: [name], [name] 09/26/2020 10:00 A M Medical Record Number: [id number] Patient Account Number: [id number] Date of Birth/Sex: Treating RN: 1955-09-17 (65 y.o. 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 09/26/2020 10:00 A M Medical  Record Number: [id number] Patient Account Number: [id number] Date of Birth/Sex: Treating RN: 1955/06/25 (65 y.o. [name] Provider: [name] Other Clinician: Referring Provider: Treating Provider/Extender: [name] in Treatment: 9 Subjective Chief Complaint Information obtained from Patient patient is been followed long-term in this clinic for venous insufficiency ulcers with inflammation, hypertension and ulceration over the medial ankle bilaterally. 01/17/2020; this is a patient who is here for review of postoperative wounds on the left lateral ankle and recurrence of venous stasis ulceration on the left medial History of Present Illness (HPI) the remaining wound is over the left medial ankle. Similar wound over the right medial ankle healed largely with use of Apligraf. Most recently we have been using Hydrofera Blue over this wound with considerable improvement. The patient has been extensively worked up in the past for her venous insufficiency and she is not a candidate for antireflux surgery although I have none of the details available currently. 08/24/14; considerable improvement today. About 50% of this wound areas now epithelialized. The base of the wound appears to be healthier granulation.as opposed to last week when she  had deteriorated a considerable improvement 08/17/14; unfortunately the wound has regressed somewhat. The areas of epithelialization from the superior aspect are not nearly as healthy as they were last week. The patient thinks her Hydrofera Blue slipped. 09/07/14; unfortunately the area has markedly regressed in the 2 weeks since I've seen this. There is an odor surrounding erythema. The healthy granulation tissue that we had at the base of the wound now is a dusky color. The nurse reports green drainage 09/14/14; the area looks somewhat better than last week. There is less erythema and less drainage. The culture I  did did not show any growth. Nevertheless I think it is better to continue the Cipro and doxycycline for a further week. The remaining wound area was debridement. 09/21/14. Wound did not require debridement last week. Still less erythema and less drainage. She can complete her antibiotics. The areas of epithelialization in the superior aspect of the wound do not look as healthy as they did some weeks ago 10/05/14 continued improvement in the condition of this wound. There is advancing epithelialization. Less aggressive debridement required 10/19/14 continued improvement in the condition and volume of this wound. Less aggressive debridement to the inferior part of this to remove surface slough and fibrinous eschar 11/02/14 no debridement is required. The surface granulation appears healthy although some of her islands of epithelialization seem to have regressed. No evidence of infection 11/16/14; lites surface debridement done of surface eschar. The wound does not look to be unhealthy. No evidence of infection. Unfortunately the patient has had podiatry issues in the right foot and for some reason has redeveloped small surface ulcerations in the medial right ankle. Her original presentation involved wounds in this area 11/23/14 no debridement. The area on the right ankle has enlarged. The left ankle wound appears stable in terms of the surface although there is periwound inflammation. There has been regression in the amount of new skin 11/30/14 no debridement. Both wound areas appear healthy. There was no evidence of infection. The the new area on the right medial ankle has enlarged although that both the surfaces appear to be stable. 12/07/14; Debridement of the right medial ankle wound. No no debridement was done on the left. 12/14/14 no major change in and now bilateral medial ankle wounds. Both of these are very painful but the no overt evidence of infection. She has had previous venous ablation 12/21/14;  patient states that her right medial ankle wound is considerably more painful last week than usual. Her left is also somewhat painful. She could not tolerate debridement. The right medial ankle wound has fibrinous surface eschar 12/28/14 this is a patient with severe bilateral venous insufficiency ulcers. For a considerable period of time we actually had the one on the right medial ankle healed however this recently opened up again in June. The left medial ankle wound has been a refractory area with some absent flows. We had some success with Hydrofera Blue on this area and it literally closed by 50% however it is recently opened up Foley. Both of these were debridement today of surface eschar. She tolerates this poorly 01/25/15: No change in the status of this. Thick adherent escar. Very poor tolerance of any attempt at debridement. I had healed the right medial malleolus wound for a considerable amount of time and had the left one down to about 50% of the volume although this is totally regressed over the last 48 weeks. Further the right leg has reopened. she is trying to make a  appointment with pain and vascular, previous ablations with Dr. Aleda Grana. I do not believe there is an arterial insufficiency issue here 02/01/15 the status of the adherent eschar bilaterally is actually improved. No debridement was done. She did not manage to get vascular studies done 02/08/15 continued debridement of the area was done today. The slough is less adherent and comes off with less pressure. There is no surrounding infection peripheral pulses are intact 02/15/15 selective debridement with a disposable curette. Again the slough is less adherent and comes off with less difficulty. No surrounding infection peripheral pulses are intact. 02/22/15 selective debridement of the right medial ankle wound. Slough comes off with less difficulty. No obvious surrounding infection peripheral pulses are intact I did not  debridement the one on the left. Both of these are stable to improved 03/01/15 selective debridement of both wound areas using a curette to. Adherent slough cup soft with less difficulty. No obvious surrounding infection. The patient tells me that 2 days ago she noted a rash above the right leg wrap. She did not have this on her lower legs when she change this over she arrives with widespread left greater than right almost folliculitis-looking rash which is extremely pruritic. I don't see anything to culture here. There is no rash on the rest of her body. She feels well systemically. 03/08/15; selective debridement of both wounds using a curette. Base of this does not look unhealthy. She had limegreen drainage coming out of the left leg wound and describes a lot of drainage. The rash on her left leg looks improved to. No cultures were done. 03/22/15; patient was not here last week. Basal wounds does not look healthy and there is no surrounding erythema. No drainage. There is still a rash on the left leg that almost looks vasculitic however it is clearly limited to the top of where the wrap would be. 04/05/15; on the right required a surgical debridement of surface eschar and necrotic subcutaneous tissue. I did not debridement the area on the left. These continue to be large open wounds that are not changing that much. We were successful at one point in healing the area on the right, and at the same time the area on the left was roughly half the size of current measurements. I think a lot of the deterioration has to do with the prolonged time the patient is on her feet at work 04/19/15 I attempted-like surface debridement bilaterally she does not tolerate this. She tells me that she was in allergic care yesterday with extreme pain over her left lateral malleolus/ankle and was told that she has an "sprain" 05/03/15; large bilateral venous insufficiency wounds over the medial malleolus/medial aspect of her  ankles. She complains of copious amounts of drainage and his usual large amounts of pain. There is some increasing erythema around the wound on the right extending into the medial aspect of her foot to. historically she came in with these wounds the right one healed and the left one came down to roughly half its current size however the right one is reopened and the left is expanded. 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 05/10/15 large bilateral venous insufficiency wounds. There is less adherence surface left however the surface culture that I did last week grew pseudomonas therefore bilateral selective debridement score necessary. There is surrounding erythema. The patient describes severe bilateral drainage and a lot of pain in the  left ankle. Apparently her podiatrist was were ready to do a cortisone shot 05/17/15; the patient complains of pain and again copious amounts of drainage. 05/24/15; we used Iodo flex last week. Patient notes considerable improvement in wound drainage. Only needed to change this once. 05/31/15; we continued Iodoflex; the base of these large wounds bilaterally is not too bad but there is probably likely a significant bioburden here. I would like to debridement just doesn't tolerate it. 06/06/14 I would like to continue the Iodoflex although she still hasn't managed to obtain supplies. She has bilateral medial malleoli or large wounds which are mostly superficial. Both of them are covered circumferentially with some nonviable fibrinous slough although she tolerates debridement very poorly. She apparently has an appointment for an ablation on the right leg by interventional radiology. 06/14/15; the patient arrives with the wounds and static condition. We attempted a debridement although she does not do well with this secondary to pain. I 07/05/15; wounds are not much smaller however there appears to be a cleaner granulating  base. The left has tight fibrinous slough greater than the right. Debridement is tolerated poorly due to pain. Iodoflex is done more for these wounds in any of the multitude of different dressings I have tried on the left 1 and then subsequently the right. 07/12/15; no change in the condition of this wound. I am able to do an aggressive debridement on the right but not the left. She simply cannot tolerate it. We have been using Iodoflex which helps somewhat. It is worthwhile remembering that at one point we healed the right medial ankle wound and the left was about 25% of the current circumference. We have suggested returning to vascular surgery for review of possible further ablations for one reason or another she has not been able to do this. 07/26/15 no major change in the condition of either wound on her medial ankle. I did not attempt to debridement of these. She has been aggressively scrubbing these while she is in the shower at home. She has her supply of Iodoflex which seems to have done more for these wounds then anything I have put on recently. 08/09/15 wound area appears larger although not verified by measurements. Using Iodoflex 09/05/2015 -- she was here for avisit today but had significant problems with the wound and I was asked to see her for a physician opinion. I have summarize that this lady has had surgery on her left lower extremity about 10 years ago where the possible veins stripping was done. She has had an opinion from interventional radiology around November 2016 where no further sclerotherapy was ordered. The patient works 12 hours a day and stands on a concrete floor with work boots and is unable to get the proper compression she requires and cannot elevate her limbs appropriately at any given time. She has recently grown Pseudomonas from her wound culture but has not started her ciprofloxacin which was called in for her. 09/13/15 this continues to be a difficult situation for  this patient. At one point I had this wound down to a 1.5 x 1.5" wound on her left leg. This is deteriorated and the right leg has reopened. She now has substantial wounds on her medial calcaneus, malleoli and into her lower leg. One on the left has surface eschar but these are far too painful for me to debridement here. She has a vascular surgery appointment next week to see if anything can be done to help here. I think she  has had previous ablations several years ago at [facility]. She has no major edema. She tells me that she did not get product last time [facility]] and went for several days without it. She continues to work in work boots 12 hours a day. She cannot get compression/4-layer under her work boots. 09/20/15 no major change. Periwound edema control was not very good. Her point with pain and vascular is next Wednesday the 25th 09/28/15; the patient is seen vascular surgery and is apparently scheduled for repeat duplex ultrasounds of her bilateral lower legs next week. 10/05/15; the patient was seen by Dr. Doren Custard of vascular surgery. He feels that she should have arterial insufficiency excluded as cause/contributed to her nonhealing stage she is therefore booked for an arteriogram. She has apparently monophasic signals in the dorsalis pedis pulses. She also of course has known severe chronic venous insufficiency with previous procedures as noted previously. I had another long discussion with the patient today about her continuing to work 12 hour shifts. I've written her out for 2 months area had concerns about this as her work location is currently undergoing significant turmoil and this may lead to her termination. She is aware of this however I agree with her that she simply cannot continue to stand for 12 hours multiple days a week with the substantial wound areas she has. 10/19/15; the Dr. Doren Custard appointment was largely for an arteriogram which was normal. She does not have an arterial  issue. He didn't make a comment about her chronic venous insufficiency for which she has had previous ablations. Presumably it was not felt that anything additional could be done. The patient is now out of work as I prescribed 2 weeks ago. Her wounds look somewhat less aggravated presumably because of this. I felt I would give debridement another try today 10/25/15; no major change in this patient's wounds. We are struggling to get her product that she can afford into her own home through her insurance. 11/01/15; no major change in the patient's wounds. I have been using silver alginate as the most affordable product. I spoke to Dr. Marla Roe last week with her requested take her to the OR for surgical debridement and placement of ACEL. Dr. Marla Roe told me that she would be willing to do this however [facility] will not cover this, fortunately the patient has [place] healthcare of some variant 11/08/15; no major change in the patient's wounds. She has been completely nonviable surface that this but is in too much pain with any attempted debridement are clinic. I have arranged for her to see Dr. Marla Roe ham of plastic surgery and this appointment is on Monday. I am hopeful that they will take her to the OR for debridement, possible ACEL ultimately possible skin graft 11/22/15 no major change in the patient's wounds over her bilateral medial calcaneus medial malleolus into the lower legs. Surface on these does not look too bad however on the left there is surrounding erythema and tenderness. This may be cellulitis or could him sleepy tinea. 11/29/15; no major changes in the patient's wounds over her bilateral medial malleolus. There is no infection here and I don't think any additional antibiotics are necessary. There is now plan to move forward. She sees Dr. Marla Roe in a week's time for preparation for operative debridement and ACEL placement I believe on 7/12. She then has a follow-up  appointment with Dr. Marla Roe on 7/21 12/28/15; the patient returns today having been taken to the OR  by Dr. Marla Roe 12/12/15 she underwent debridement, intraoperative cultures [which were negative]. She had placement of a wound VAC. Parent really ACEL was not available to be placed. The wound VAC foam apparently adhered to the wound since then she's been using silver alginate, Xeroform under Ace wraps. She still says there is a lot of drainage and a lot of pain 01/31/16; this is a patient I see monthly. I had referred her to Dr. Marla Roe him of plastic surgery for large wounds on her bilateral medial ankles. She has been to the OR twice once in early July and once in early August. She tells me over the last 3 weeks she has been using the wound VAC with ACEL underneath it. On the right we've simply been using silver alginate. Under Kerlix Coban wraps. 02/28/16; this is a patient I'm currently seeing monthly. She is gone on to have a skin graft over her large venous insufficiency ulcer on the left medial ankle. This was done by Dr. Marla Roe him. The patient is a bit perturbed about why she didn't have one on her right medial ankle wound. She has been using silver alginate to this. 03/06/16; I received a phone call from her plastic surgery Dr. Marla Roe. She expressed some concern about the viability of the skin graft she did on the left medial ankle wound. Asked me to place Endoform on this. She told me she is not planning to do a subsequent skin graft on the right as the left one did not take very well. I had placed Hydrofera Blue on the right 03/13/16; continue to have a reasonably healthy wound on the right medial ankle. Down to 3 mm in terms of size. There is epithelialization here. The area on the left medial ankle is her skin graft site. I suppose the last week this looks somewhat better. She has an open area inferiorly however in the center there appears to be some viable tissue. There is a  lot of surface callus and eschar that will eventually need to come off however none of this looked to be infected. Patient states that the is able to keep the dressing on for several days which is an improvement. 03/20/16 no major change in the circumference of either wound however on the left side the patient was at Dr. Eusebio Friendly office and they did a debridement of left wound. 50% of the wound seems to be epithelialized. I been using Endoform on the left Hydrofera Blue in the right 03/27/16; she arrives today with her wound is not looking as healthy as they did last week. The area on the right clearly has an adherent surface to this a very similar surface on the left. Unfortunately for this patient this is all too familiar problem. Clearly the Endoform is not working and will need to change that today that has some potential to help this surface. She does not tolerate debridement in this clinic very well. She is changing the dressing wants 04/03/16; patient arrives with the wounds looking somewhat better especially on the right. Dr. Migdalia Dk change the dressing to silver alginate when she saw her on Monday and also sold her some compression socks. The usefulness of the latter is really not clear and woman with severely draining wounds. 04/10/16; the patient is doing a bit of an experiment wearing the compression stockings that Dr. Migdalia Dk provided her to her left leg and the out of legs based dressings that we provided to the right. 05/01/16; the patient is continuing to  wear compression stockings Dr. Migdalia Dk provided her on the left that are apparently silver impregnated. She has been using Iodoflex to the right leg wound. Still a moderate amount of drainage, when she leaves here the wraps only last for 4 days. She has to change the stocking on the left leg every night 05/15/16; she is now using compression stockings bilaterally provided by Dr. Marla Roe. She is wearing a nonadherent layer over the  wounds so really I don't think there is anything specific being done to this now. She has some reduction on the left wound. The right is stable. I think all healing here is being done without a specific dressing 06/09/16; patient arrives here today with not much change in the wound certainly in diameter to large circular wounds over the medial aspect of her ankle bilaterally. Under the light of these services are certainly not viable for healing. There is no evidence of surrounding infection. She is wearing compression stockings with some sort of silver impregnation as prescribed by Dr. Marla Roe. She has a follow-up with her tomorrow. 06/30/16; no major change in the size or condition of her wounds. These are still probably covered with a nonviable surface. She is using only her purchase stockings. She did see Dr. Marla Roe who seemed to want to apply Dakin's solution to this I'm not extreme short what value this would be. I would suggest Iodoflex which she still has at home. 07/28/16; I follow Mrs. Sicoli episodically along with Dr. Marla Roe. 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 She has been applying a topical collagen ointment to both wounds with Adaptic. I don't think Dr. Marla Roe is planning to take her back to the OR. 08/19/16; I follow Mrs. Jeneen Rinks on a monthly basis along with Dr. Marla Roe of plastic surgery. 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 I been following her for a number of years. At one point I was able to get the right medial malleolus wound to heal and had the left medial malleolus down to about half its current size however and I had to send her to plastic surgery for an operative debridement. Since then things have been stable to slightly improve the area on the right is slightly better one in the left about the same although there is much  less adherent surface than I'm used to with this patient. She is using some form of liquid collagen gel that Dr. Marla Roe provided a Kerlix cover with the patient's own pressure stockings. She tells me that she has extreme pain in both ankles and along the lateral aspect of both feet. She has been unable to work for some period of time. She is telling me she is retiring at the beginning of April. She sees Dr. Doran Durand of orthopedics next week 09/22/16; patient has not seen Dr. Marla Roe since the last time she is here. I'm not really sure what she is using to the wounds other than bits and pieces of think she had left over including most recently Hydrofera Blue. She is using juxtalite stockings. She is having difficulty with her husband's recent illness "stroke". She is having to transport him to various doctors appointments. Dr. Marla Roe left her the option of a repeat debridement with ACEL however she has not been able to get the time to follow-up on this. She continues to have a fair amount of drainage out of these wounds with certainly  precludes leaving dressings on all week 10/13/16; patient has not seen Dr. Marla Roe since she was last in our clinic. I'm not really sure what she is doing with the wounds, we did try to get her [facility] and I think she is actually using this most of the time. Because of drainage she states she has to change this every second day although this is an improvement from what she used to do. She went to see Dr. Doran Durand who did not think she had a muscular issue with regards to her feet, he referred her to a neurologist and I think the appointment is sometime in June. I changed her back to Iodoflex which she has used in the past but not recently. 11/03/16; the patient has been using Iodoflex although she ran out of this. Still claims that there is a lot of drainage although the wound does not look like this. No surrounding erythema. She has not been back to see Dr.  Marla Roe 11/24/16; the patient has been using Iodoflex again but she ran out of it 2 or 3 days ago. There is no major change in the condition of either one of these wounds in fact they are larger and covered in a thick adherent surface slough/nonviable tissue especially on the left. She does not tolerate mechanical debridement in our clinic. Going back to see Dr. Marla Roe of plastic surgery for an operative debridement would seem reasonable. 12/15/16; the patient has not been back to see Dr. Marla Roe. She is been dealing with a series of illnesses and her husband which of monopolized her time. She is been using Sorbact which we largely supplied. She states the drainage is bad enough that it maximum she can go 2-3 days without changing the dressing 01/12/2017 -- the patient has not been back for about 4 weeks and has not seen Dr. Marla Roe not does she have any appointment pending. 01/23/17; patient has not seen Dr. Marla Roe even though I suggested this previously. She is using Santyl that was suggested last week by Dr. Con Memos this Cost her $16 through her insurance which is indeed surprising 02/12/17; continuing Santyl and the patient is changing this daily. A lot of drainage. She has not been back to see plastic surgery she is using an Ace wrap. Our intake nurse suggested wrap around stockings which would make a good reasonable alternative 02/26/17; patient is been using Santyl and changing this daily due to drainage. She has not been to see plastic surgery she uses in April Ace wrap to control the edema. She did obtain extremitease stockings but stated that the edema in her leg was to big for these 03/20/17; patient is using Santyl and Anasept. Surfaces looked better today the area on the right is actually measuring a little smaller. She has states she has a lot of pain in her feet and ankles and is asking for a consult to pain control which I'll try to help her with through our case  manager. 04/10/17; the patient arrives with better-looking wound surfaces and is slightly smaller wound on the left she is using a combination of Santyl and Anasept. She has an appointment or at least as started in the pain control center associated with [facility] 05/14/17; this is a patient who I followed for a prolonged period of time. 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 At one point I had this down to a much smaller wound on the left however these reopened and we've never been  able to get these to heal. She has been using Santyl and Anasept gel although 2 weeks ago she ran out of the Anasept gel. She has a stable appearance of the wound. She is going to the wound care clinic at [facility]. They wanted do a nerve block/spinal block although she tells me she is reluctant to go forward with that. 05/21/17; this is a patient I have followed for many years. 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 Chronic pain and deformity in her ankles as well. She is been to see plastic surgery as well as orthopedics. Using PolyMem AG most recently/Kerramax/ABDs and 2 layer compression. She has managed to keep this on and she is coming in for a nurse check to change the dressing on Tuesdays, we see her on Fridays 06/05/17; really quite a good looking surface and the area especially on the right medial has contracted in terms of dimensions. Well granulated healthy-looking tissue on both sides. Even with an open curet there is nothing that even feels abnormal here. This is as good as I've seen this in quite some time. We have been using PolyMem AG and bringing her in for a nurse check 06/12/17; really quite good surface on both of these wounds. The right medial has contracted a bit left is not. We've been using PolyMem and AG and she is coming in for a nurse visit 06/19/17; we have been using PolyMem AG and bringing her in for a nurse check.  Dimensions of her wounds are not better but the surfaces looked better bilaterally. She complained of bleeding last night and the left wound and increasing pain bilaterally. She states her wound pain is more neuropathic than just the wounds. There was some suggestion that this was radicular from her pain management doctor in talking to her it is really difficult to sort this out. 06/26/17; using PolyMem and AG and bringing her in for a nurse check as All of this and reasonably stable condition. Certainly not improved. The dimensions on the lateral part of the right leg look better but not really measuring better. The medial aspect on the left is about the same. 07/03/16; we have been using PolyMen AG and bringing her in for a nurse check to change the dressings as the wounds have drainage which precludes once weekly changing. We are using all secondary absorptive dressings.our intake nurse is brought up the idea of using a wound VAC/snap VAC on the wound to help with the drainage to see if this would result in some contraction. This is not a bad idea. The area on the right medial is actually looking smaller. Both wounds have a reasonable-looking surface. There is no evidence of cellulitis. The edema is well controlled 07/10/17; the patient was denied for a snap VAC by her insurance. The major issue with these wounds continues to be drainage. We are using wicked PolyMem AG and she is coming in for a nurse visit to change this. The wounds are stable to slightly improved. The surface looks vibrant and the area on the right certainly has shrunk in size but very slowly 07/17/17; the patient still has large wounds on her bilateral medial malleoli. Surface of both of these wounds looks better. The dimensions seem to come and go but no consistent improvement. There is no epithelialization. We do not have options for advanced treatment products due to insurance issues. They did not approve of the wound VAC to help  control the drainage. More  recently we've been using PolyMem and AG wicked to allow drainage through. We have been bringing her in for a nurse visit to change this. We do not have a lot of options for wound care products and the home again due to insurance issues 07/24/17; the patient's wound actually looks somewhat better today. No drainage measurements are smaller still healthy-looking surface. We used silver collagen under PolyMen started last week. We have been bringing her in for a dressing change 07/31/17; patient's wound surface continued to look better and I think there is visible change in the dimensions of the wound on the right. Rims of epithelialization. We have been using silver collagen under PolyMen and bringing her in for a dressing change. There appears to be less drainage although she is still in need of the dressing change 08/07/17. Patient's wound surface continues to look better on both sides and the area on the right is definitely smaller. We have been using silver collagen and PolyMen. She feels that the drainage has been it has been better. I asked her about her vascular status. She went to see Dr. Aleda Grana at [facility] and had some form of ablation. I don't have much detail on this. I haven't my notes from 2016 that she was not a candidate for any further ablation but I don't have any more information on this. We had referred her to vein and vascular I don't think she ever went. He does not have a history of PAD although I don't have any information on this either. We don't even have ABIs in our record 08/14/17; we've been using silver collagen and PolyMen cover. And putting the patient and compression. She we are bringing her in as a nurse visit to change this because ofarge amount of drainage. We didn't the ABIs in clinic today since they had been done in many moons 1.2 bilaterally. She has been to see vein and vascular however this was at [facility] and she had ablation  although I really don't have any information on this all seemed biking get a report. She is also been operatively debrided by plastic surgery and had a cell placed probably 8-12 months ago. This didn't have a major effect. We've been making some gains with current dressings 08/19/17-She is here in follow-up evaluation for bilateral medial malleoli ulcers. She continues to tolerate debridement very poorly. We will continue with recently changed topical treatment; if no significant improvement may consider switching to Iodosorb/Iodoflex. 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 She has been using silver collagen and PolyMem. I believe she has been used and tried on Iodoflex before. 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 These have been very refractory over time. We have been using silver collagen and PolyMen. She is coming in once a week for a doctor's and once a week for nurse visits. We are actually making some progress 09/18/17; the patient's wounds are smaller especially on the right medial. She arrives today to upset to consider even washing these off with Anasept which I think is been part of the reason this is been closing. We've been using collagen covered in PolyMen otherwise. It is noted that she has a small area of folliculitis on the right medial calf that. As we are wrapping her legs  I'll give her a short course of doxycycline to make sure this doesn't amount to anything. She is a long list of complaints today including imbalance, shortness of breath on exertion, inversion of her left ankle. With regards to the latter complaints she is been to see orthopedics and they offered her a tendon release surgery I believe but wanted her wounds to be closed  first. I have recommended she go see her primary physician with regards to everything else. 09/25/17; patient's wounds are about the same size. We have made some progress bilaterally although not in recent weeks. She will not allow me T wash these wounds with Anasept even if she is doing her cell. Wheeze we've been using collagen covered in PolyMen. Last week she had a small area of folliculitis this is now opened into a small wound. She completed 5 days of trimethoprim sulfamethoxazole 10/02/17; unfortunately the area on her left medial ankle is worse with a larger wound area towards the Achilles. The patient complains of a lot of pain. She will not allow debridement although visually I don't think there is anything to debridement in any case. We have been using silver collagen and PolyMen for several months now. Initially we are making some progress although I'm not really seeing that today. We will move back to [facility]. His admittedly this is a bit of a repeat however I'm hoping that his situation is different now. The patient tells me she had her leg on the left give out on her yesterday this is process some pain. 10/09/17; the patient is seen twice a week largely because of drainage issues coming out of the chronic medial bimalleolar wounds that are chronic. Last week the dimensions of the one on the left looks a little larger I changed her to [facility]. She comes in today with a history of terrible pain in the bilateral wound areas. She will not allow debridement. She will not even allow a tissue culture. There is no surrounding erythema no no evidence of cellulitis. We have been putting her Kerlix Coban man. She will not allow more aggressive compression as there was a suggestion to put her in 3 layer wraps. 10/16/17; large wounds on her bilateral medial malleoli. These are chronic. Not much change from last week. The surface looks have healthy but absolutely no epithelialization. A lot  of pain little less so of drainage. She will not allow debridement or even washing these off in the vigorous fashion with Anasept. 10/23/17; large wounds on her bilateral malleoli which are chronic. Some improvement in terms of size perhaps on the right since last time I saw these. She states that after we increased the 3 layer compression there was some bleeding, when she came in for a nurse visit she did not want 3 layer compression put back on about our nurse managed to convince her. She has known chronic venous visit issues and I'm hoping to get her to tolerate the 3 layer compression. using Hydrofera Blue 10/30/17; absolutely no change in the condition of either wound although we've had some improvement in dimensions on the right.. Attempted to put her in 3 layer compression she didn't tolerated she is back in 2 layer compression. We've been using Hydrofera Blue We looked over her past records. She had venous reflux studies in November 2016. There was no evidence of deep venous reflux on the right. Superficial vein did not show the greater saphenous vein at think this is been previously ablated the small  saphenous vein was within normal limits. The left deep venous system showed no DVT the vessels were positive for deep venous reflux in the posterior tibial veins at the ankle. The greater saphenous vein was surgically absent small saphenous vein was within normal limits. She went to vein and vascular at [facility]. I believe she had an ablation on the left greater saphenous vein. I'll update her reflux studies perhaps ever reviewed by vein and vascular. We've made absolutely no progress in these wounds. Will also try to read and TheraSkins through her insurance 11/06/17; W the patient apparently has a 2 week follow-up with vein and vascular I like him to review the whole issue with regards to her previous vascular workup by Dr. Aleda Grana. We've really made no progress on these wounds in many  months. She arrives today with less viable looking surface on the left medial ankle wound. This was apparently looking about the same on Tuesday when she was here for nurse visit. 11/13/17; deep tissue culture I did last time of the left lower leg showed multiple organisms without any predominating. In particular no Staphylococcus or group A strep were isolated. We sent her for venous reflux studies. She's had a previous left greater saphenous vein stripping and I think sclerotherapy of the right greater saphenous vein. She didn't really look at the lesser saphenous vein this both wounds are on the medial aspect. She has reflux in the common femoral vein and popliteal vein and an accessory vein on the right and the common femoral vein and popliteal vein on the left. I'm going to have her go to see vein and vascular just the look over things and see if anything else beside aggressive compression is indicated here. We have not been able to make any progress on these wounds in spite of the fact that the surface of the wounds is never look too bad. 11/20/17; no major change in the condition of the wounds. Patient reports a large amount of drainage. She has a lot of complaints of pain although enlisting her today I wonder if some of this at least his neuropathic rather than secondary to her wounds. She has an appointment with vein and vascular on 12/30/17. The refractory nature of these wounds in my mind at least need vein and vascular to look over the wounds the recent reflux studies we did and her history to see if anything further can be done here. I also note her gait is deteriorated quite a bit. 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 She has a bilateral Trendelenburg gait. I wonder if this is neuropathic or perhaps multilevel radicular. 11/27/17; her wounds actually looks slightly better. Healthy-looking granulation tissue a scant amount of epithelialization. [place] healthcare will not pay  for [employer]. They will play for tri layer Oasis and Dermagraft. This is not a diabetic ulcer. We'll try for the tri layer Oasis. She still complains of some drainage. She has a vein and vascular appointment on 12/30/17 12/04/17; the wounds visually look quite good. Healthy-looking granulation with some degree of epithelialization. We are still waiting for response to our request for trial to try layer Oasis. Her appointment with vascular to review venous and arterial issues isn't sold the end of July 7/31. Not allow debridement or even vigorous cleansing of the wound surface. 12/18/17; slightly smaller especially on the right. 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 We've been using Hydrofera Blue. We still are looking into triple layer Oasis  through her insurance 01/08/18 on evaluation today patient's wound actually appears to be showing signs of good improvement at this point in time. She has been tolerating the dressing changes without complication. Fortunately there does not appear to be any evidence of infection at this point in time. We have been utilizing silver nitrate which does seem to be of benefit for her which is also good news. Overall I'm very happy with how things seem to be both regards appearance as well as measurement. Patient did see Dr. Bridgett Larsson for evaluation on 12/30/17. In his assessment he felt that stripping would not likely add much more than chronic compression to the patient's healing process. His recommendation was to follow-up in three months with Dr. Doren Custard if she hasn't healed in order to consider referral back to you and see vascular where she previously was in a trial and was able to get her wound to heal. I'll be see what she feels she when you staying compression and he reiterated this as well. 01/13/18 on evaluation today patient appears to actually be doing very well in regard to her bilateral medial malleolus ulcers. She seems to have  tolerated the chemical cauterization with silver nitrate last week she did have some pain through that evening but fortunately states that I'll be see since it seems to be doing better she is overall pleased with the progress. 01/21/18; really quite a remarkable improvement since I've last seen these wounds. We started using silver nitrate specially on the islands of hyper granulation which for some reason her around the wound circumference. This is really done quite nicely. Primary dressing Hydrofera Blue under 4 layer compression. She seems to be able to hold out without a nurse rewrap. Follow-up in 1 week 01/28/18; we've continued the hydrofera blue but continued with chemical cauterization to the wound area that we started about a month ago for irregular hyper granulation. She is made almost stunning improvement in the overall wound dimensions. I was not really expecting this degree of improvement in these chronic wounds 02/05/18; we continue with Hydrofera Bluebut of also continued the aggressive chemical cauterization with silver nitrate. We made nice progress with the right greater than left wound. 02/12/18. We continued with Hydrofera Blue after aggressive chemical cauterization with silver nitrate. We appear to be making nice progress with both wound areas 02/19/2018; we continue with [facility] after washing the wounds vigorously with Anasept spray and chemical cauterization with silver nitrate. We are making excellent progress. The area on the right's just about closed 02/26/2018. The area on the left medial ankle had too much necrotic debris today. I used a #5 curette we are able to get most of the soft. I continued with the silver nitrate to the much smaller wound on the right medial ankle she had a new area on her right lower pretibial area which she says was due to a role in her compression 03/05/2018; both wound areas look healthy. Not much change in dimensions from last week. I  continue to use silver nitrate and Hydrofera Blue. The patient saw Dr. Doren Custard of vein and vascular. He felt she had venous stasis ulcers. He felt based on her previous arteriogram she should have adequate circulation for healing. Also she has deep venous reflux but really no significant correctable superficial venous reflux at this time. He felt we should continue with conservative management including leg elevation and compression 04/02/2018; since we last saw this woman about a month ago she had a fall apparently  suffered a pelvic fracture. I did not look up the x-ray. Nevertheless because of pain she literally was bedbound for 2 weeks and had home health coming out to change the dressing. Somewhat predictably this is resulted in considerable improvement in both wound areas. The right is just about closed on the medial malleolus and the left is about half the size. 04/16/2018; both her wounds continue to go down in size. Using Hydrofera Blue. 05/07/18; both her wounds appeared to be improving especially on the right where it is almost closed. We are using Hydrofera Blue 05/14/2018; slightly worse this week with larger wounds. Surface on the left medial not quite as good. We have been using Hydrofera Blue 05/21/18; again the wounds are slightly larger. Left medial malleolus slightly larger with eschar around the circumference. We have been using Hydrofera Blue undergoing a wraps for a prolonged period of time. This got a lot better when she was more recumbent due to a fall and a back injury. I change the primary dressing the silver alginate today. She did not tolerate a 4 layer compression previously although I may need to bring this up with her next time 05/28/2018; area on the left medial malleolus again is slightly larger with more drainage. Area on the right is roughly unchanged. She has a small area of folliculitis on the right medial just on the lower calf. This does not look ominous. 06/03/2018  left medial malleolus slightly smaller in a better looking surface. We used silver nitrate on this last time with silver alginate. The area on the right appears slightly smaller 1/10; left medial malleolus slightly smaller. Small open area on the right. We used silver nitrate and silver alginate as of 2 weeks ago. We continue with the wound and compression. These got a lot better when she was off her feet 1/17; right medial malleolus wound is smaller. The left may be slightly smaller. Both surfaces look somewhat better. 1/24; both wounds are slightly smaller. Using silver alginate under Unna boots 1/31; both wounds appear smaller in fact the area on the right medial is just about closed. Surface eschar. We have been using silver alginate under Unna boots. The patient is less active now spends let much less time on her feet and I think this is contributed to the general improvement in the wound condition 2/7; both wounds appear smaller. I was hopeful the right medial would be closed however there there is still the same small open area. Slight amount of surface eschar on the left the dimensions are smaller there is eschar but the wound edges appear to be free. We have been using silver alginate under Unna boot's 2/14; both wounds once again measure smaller. Circumferential eschar on the left medial. We have been using silver alginate under Unna boots with gradual improvement 2/21; the area on the right medial malleolus has healed. The area on the left is smaller. We have been using silver alginate and Unna boots. We can discharge wrapping the right leg she has 20/30 stockings at home she will need to protect the scar tissue in this area 2/28; the area on the right medial malleolus remains closed the patient has a compression stocking. The area on the left is smaller. We have been using silver alginate and Unna boots. 3/6 the area on the right medial ankle remains closed. Good edema control noted she  is using her own compression stocking. The area on the left medial ankle is smaller. We have been managing this  with silver alginate and Unna boots which we will continue today. 3/13; the area on the right medial ankle remains closed and I'm declaring it healed today. When necessary the left is about the same still a healthy-looking surface but no major change and wound area. No evidence of infection and using silver alginate under unna and generally making considerable improvement 3/27 the area on the right medial ankle remains closed the area on the left is about the same as last week. Certainly not any worse we have been using silver alginate under an Unna boot 4/3; the area on the right medial ankle remains closed per the patient. We did not look at this wound. The wound on the left medial ankle is about the same surface looks healthy we have been using silver alginate under an Unna boot 4/10; area on the right medial ankle remains closed per the patient. We did not look at this wound. The wound on the left medial ankle is slightly larger. The patient complains that the [facility] caused burning pain all week. She also told us that she was a lot more active this week. Changed her back to silver alginate 4/17; right medial ankle still closed per the patient. Left medial ankle is slightly larger. Using silver alginate. She did not tolerate Hydrofera Blue on this area 4/24; right medial ankle remains closed we have not look at this. The left medial ankle continues to get larger today by about a centimeter. We have been using silver alginate under Unna boots. She complains about 4 layer compression as an alternative. She has been up on her feet working on her garden 5/8; right medial ankle remains closed we did not look at this. The left medial ankle has increased in size about 100%. We have been using silver alginate under Unna boots. She noted increased pain this week and was not surprised that  the wound is deteriorated 5/15; no major change in SA however much less erythema ( one week of doxy ocellulitis). 5/22-65 year old female returns at 1 week to the clinic for left medial ankle wound for which we have been using silver alginate under 3 layer compression She was placed on DOXY at last visit - the wound is wider at this visit. She is in 3 layer compression 5/29; change to [facility] last week. I had given her empiric doxycycline 2 weeks ago for a week. She is in 3 layer compression. She complains of a lot of pain and drainage on presentation today. 6/5; using Hydrofera Blue. I gave her doxycycline recently empirically for erythema and pain around the wound. Believe her cultures showed enterococcus which not would not have been well covered by doxycycline nevertheless the wound looks better and I don't feel specifically that the enterococcus needs to be covered. She has a new what looks like a wrap injury on her lateral left ankle. 6/12; she is using Hydrofera Blue. She has a new area on the left anterior lower tibial area. This was a wrap injury last week. 6/19; the patient is using Hydrofera Blue. She arrived with marked inflammation and erythema around the wound and tenderness. 12/01/18 on evaluation today patient appears to be doing a little bit better based on what I'm hearing from the standpoint of lassos evaluation to this as far as the overall appearance of the wound is concerned. Then sometime substandard she typically sees Dr. Dellia Nims. Nonetheless overall very pleased with the progress that she's made up to this point. No fevers, chills,  nausea, or vomiting noted at this time. 7/10; some improvement in the surface area. Aggressively debrided last week apparently. I went ahead with the debridement today although the patient does not tolerate this very well. We have been using Iodoflex. Still a fair amount of drainage 7/17; slightly smaller. Using Iodoflex. 7/24; no change from  last week in terms of surface area. We have been using Iodoflex. Surface looks and continues to look somewhat better 7/31; surface area slightly smaller better looking surface. We have been using Iodoflex. This is under Unna boot compression 8/7-Patient presents at 1 week with Unna boot and Iodoflex, wound appears better 8/14-Patient presents at 1 week with Iodoflex, we use the Unna boot, wound appears to be stable better.Patient is getting Botox treatment for the inversion of the foot for tendon release, Next week 8/21; we are using Iodoflex. Unna boot. The wound is stable in terms of surface area. Under illumination there is some areas of the wound that appear to be either epithelialized or perhaps this is adherent slough at this point I was not really clear. It did not wipe off and I was reluctant to debride this today. 8/28; we are using Iodoflex in an Unna boot. Seems to be making good improvement. 9/4; using Iodoflex and wound is slightly smaller. 9/18; we are using Iodoflex with topical silver nitrate when she is here. The wound continues to be smaller 10/2; patient missed her appointment last week due to GI issues. She left and Iodoflex based dressing on for 2 weeks. Wound is about the same size about the size of a dime on the left medial lower 10/9 we have been using Iodoflex on the medial left ankle wound. She has a new superficial probable wrap injury on the dorsal left ankle 10/16; we have been using Hydrofera Blue since last week. This is on the left medial ankle 10/23; we have been using Hydrofera Blue since 2 weeks ago. This is on the left medial ankle. Dimensions are better 11/6; using Hydrofera Blue. I think the wound is smaller but still not closed. Left medial ankle 11/13; we have been using Hydrofera Blue. Wound is certainly no smaller this week. Also the surface not as good. This is the remanent of a very large area on her left medial ankle. 11/20; using Sorbact since last  week. Wound was about the same in terms of size although I was disappointed about the surface debris 12/11; 3-week follow-up. Patient was on vacation. Wound is measuring slightly larger we have been using Sorbact. 12/18; wound is about the same size however surface looks better last week after debridement. We have been using Sorbact under compression 1/15 wound is probably twice the size of last time increased in length nonviable surface. We have been using Sorbact. She was running a mild fever and missed her appointment last week 1/22; the wound is come down in size but under illumination still a very adherent debris we have been Hydrofera Blue that I changed her to last week 1/29; dimensions down slightly. We have been using Hydrofera Blue 2/19 dimensions are the same however there is rims of epithelialization under illumination. Therefore more the surface area may be epithelialized 2/26; the patient's wound actually measures smaller. The wound looks healthy. We have been using Hydrofera Blue. I had some thoughts about running Apligraf then I still may do that however this looks so much better this week we will delay that for now 3/5; the wound is small but about the same  as last week. We have been using Hydrofera Blue. No debridement is required today. 3/19; the wound is about the size of a dime. Healthy looking wound even under illumination. We have been using Hydrofera Blue. No mechanical debridement is necessary 3/26; not much change from last week although still looks very healthy. We have been using Hydrofera Blue under Unna boots Patient was offered an ankle fusion by podiatry but not until the wound heals with a proceed with this. 4/9; the patient comes in today with her original wound on the medial ankle looking satisfactory however she has some uncontrolled swelling in the middle part of her leg with 2 new open areas superiorly just lateral to the tibia. I think this was probably a wrap  issue. She said she felt uncomfortable during the week but did not call in. We have been using Hydrofera Blue 4/16; the wound on the medial ankle is about the same. She has innumerable small areas superior to this across her mid tibia. I think this is probably folliculitis. She is also been working in the yard doing a lot of sweating 4/30; the patient issue on the upper areas across her mid tibia of all healed. I think this was excessive yard work if I remember. Her wound on the medial ankle is smaller. Some debris on this we have been using Hydrofera Blue under Unna boots 5/7; mid tibia. She has been using Hydrofera Blue under an Unna wrap. She is apparently going for her ankle surgery on June 3 10/28/19-Patient returns to clinic with the ankle wound, we are using Hydrofera Blue under Unna wrap, surgery is scheduled for her left foot for June 3 so she will be back for nurse visit next week READMISSION 01/17/2020 Mrs. Thane is a 65 year old woman we have had in this clinic for a long period of time with severe venous hypertension and refractory wounds on her medial lower legs and ankles bilaterally. 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 When she was off her legs for a prolonged period example when she fell and suffered a compression fracture things would heal up quite nicely. She is now retired and we managed to heal up the right medial leg wound. The left one was very tiny last time I saw this although still refractory. 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 It got to the point where this was interfering with her walking and she elected to proceed with a ankle arthrodesis to straighten her her ankle and leave her with a functional outcome for mobilization. The patient was referred  to Dr. Doren Custard and really this took some time to arrange. Dr. Doren Custard saw her on 12/07/2019. Once again he verified that she had no arterial issues. She had previously had an angiogram several years ago. Follow-up ABIs on the left showed an ABI of 1.12 with triphasic waveforms and a TBI of 0.92. She is felt to have chronic deep venous insufficiency but I do not think it was felt that anything could be done from about this from an ablation point of view. At the time Dr. Doren Custard saw this patient the wounds actually look closed via the pictures in his clinic. The patient finally underwent her surgery on 12/15/2019. This went reasonably well and there was a good anatomic  outcome. She developed a small distal wound dehiscence on the lateral part of the surgical wound. However more problematically she is developed recurrence of the wound on the medial left ankle. There are actually 2 wounds here one in the distal lower leg and 1 pretty much at the level of the medial malleolus. It is a more distal area that is more problematic. She has been using Hydrofera Blue which started on Friday before this she was simply Ace wrapping. There was a culture done that showed Pseudomonas and she is on ciprofloxacin. A recent CNS on 8/11 was negative. The patient reports some pain but I generally think this is improving. She is using a cam boot completely nonweightbearing using a walker for pivot transfers and a wheelchair 8/24; not much improvement unfortunately she has a surgical wound on the lateral part in the venous insufficiency wound medially. The bottom part of the medial insufficiency wound is still necrotic there is exposed tendon here. We have been using Hydrofera Blue under compression. Her edema control is however better 8/31; patient in for follow-up of his surgical wound on the lateral part of her left leg and chronic venous insufficiency ulcers medially. We put her back in compression last week. She comes in today  with a complaint of 3 or 4 days worth of increasing pain. She felt her cam walker was rubbing on the area on the back of her heel. However there is intense erythema seems more likely she has cellulitis. She had 2 cultures done when she was seeing podiatry in the postop. One of them in late July showed Pseudomonas and she received a course of ciprofloxacin the other was negative on 8/11 she is allergic to penicillin with anaphylactoid complaints of hives oral swelling via information in epic 9/9; when I saw this patient last week she had intense anterior erythema around her wound on the right lateral heel and ankle and also into the right medial heel. Some of this was no doubt drainage and her walker boot however I was convinced she had cellulitis. I gave her Levaquin and Bactrim she is finishing up on this now. She is following up with Dr. [name] he saw her yesterday. He is taken her out of the walking boot of course she is still nonweightbearing. Her x-ray was negative for any worrisome features such as soft tissue air etc. Things are a lot better this week. She has home health. We have been using Hydrofera Blue under an [employer] which she put back on yesterday. I did not wrap her last week 9/17; her surrounding skin looks a lot better. In fact the area on the left lateral ankle has just a scant amount of eschar. The only remaining wound is the large area on the left medial ankle. Probably about 60% of this is healthy granulation at the surface however she has a significant divot distally. This has adherent debris in it. I been using debridement and silver collagen to try and get this area to fill-in although I do not think we have made much progress this week 9/24; the patient's wound on the left medial ankle looks a lot better. The deeper divot area distally still requires debridement but this is cleaning up quite nicely we have been using silver collagen. The patient is complaining of swelling in  her foot and is worried that that is contributing to the nonhealing of the ankle wound. She is also complaining of numbness in her anterior toes 10/4; left medial ankle.  The small area distally still has a divot with necrotic material that I have been debriding away. This has an undermining area. She is approved for Apligraf. She saw Dr. [name] her surgeon on 10/1. I think he declared himself is satisfied with the condition of things. Still nonweightbearing till the end of the month. 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 Her surgical wound is well closed. There is no evidence of infection 10/11; the patient arrived in clinic today with the expectation that we be able to put an Apligraf on this area after debridement however she arrives with a relatively large amount of green drainage on the dressing. The patient states that this started on Friday. She has not been systemically unwell. 10/19; culture I did last week showed both Enterococcus and Pseudomonas. I think this came in separate parts because I stopped her ciprofloxacin I gave her and prescribed her linezolid however now looking at the final culture result this is Pseudomonas which is resistant to quinolones. She has not yet picked up the linezolid apparently phone issues. We are also trying to get a topical antibiotic out of [place] in [place] they can be applied by home health. She is still having green drainage 10/16; the patient has her topical antibiotic from [facility] in [place]. This is a compounded gel with vancomycin and ciprofloxacin and gentamicin. We are applying this on the wound bed with silver alginate over the top with Unna boot wraps. She arrives in clinic today with a lot less ominous looking drainage although she is only use this topical preparation once the second time today. She sees Dr. [name] her surgeon on Friday she has home health changing the dressing 11/2; still  using her compounded topical antibiotic under silver alginate. Surface is cleaning up there is less drainage. We had an Apligraf for her today and I elected to apply it. A light coating of her antibiotic 04/25/2020 upon evaluation today patient appears to be doing well with regard to her ankle ulcer. There is a little bit of slough noted on the surface of the wound I am can have to perform sharp debridement to clear this away today. With that being said other than that fact overall I feel like she is making progress and we do see some new epithelial growth. There is also some improvement in the depth of the wound and that distal portion. There is little bit of slough there as well. 12/7; 2-week follow-up. Apligraf #3. Dimensions are smaller. Closing in especially inferiorly. Still some surface debris. Still using the [facility] topical antibiotic but I told her that I don't think this needs to be renewed 12/21; 2-week follow-up. Apligraf #4 dimensions are smaller. Nice improvement 06/05/2020; 2-week follow-up. The patient's wound on the left medial ankle looks really excellent. Nice granulation. Advancing epithelialization no undermining no evidence of infection. We would have to reapply for another Apligraf but with the condition of this wound I did not feel strongly about it. We used Hydrofera Blue under the same degree of compression. She follows up with Dr. [name] her surgeon a week Friday 06/13/2020 upon evaluation today patient appears to be doing excellent in regard to her wound. She has been tolerating the dressing changes without complication. Fortunately there is no signs of active infection at this time. No fevers, chills, nausea, vomiting, or diarrhea. She was using Hydrofera Blue last week. 06/20/2020 06/20/2020 on evaluation today patient actually appears to be doing excellent in regard  to her wound. This is measuring better and looking much better as well. She has been using the collagen that  seems to be doing better for her as well even though the [facility] was and is not sticking or feeling as rough on her wound. She did see Dr. [name] on Friday he is very pleased he also stated none of the hardware has shifted. That is great news 1/27; the patient has a small clean wound all that is remaining. I agree that this is too small to really consider an Apligraf. Under illumination the surface is looking quite good. We have been using collagen although the dimensions are not any better this week 2/2; the patient has a small clean wound on the left medial ankle. Although this left of her substantial original areas. Measurements are smaller. We have been using polymen Ag under an Haematologist. 2/10; small area on the left medial ankle. This looks clean nothing to debride however dimensions are about the same we have been using polymen I think now for 2 weeks 2/17; not much change in surface area. We have been using polymen Ag without any improvement. 3/17; 1 month follow-up. The patient has been using endoform without any improvement in fact I think this looks worse with more depth and more expansion 3/24; no improvement. Perhaps less debris on the surface. We have been using Sorbact for 1 week 4/4; wound measures larger. She has edema in her leg and her foot which she tells as her wrap came down. We have been using Unna boots. Sorbact of the wound. She has been approved for Apligraf 09/12/2020 upon evaluation today patient appears to be doing well with regard to her wound. We did get the Apligraf reapproved which is great news we have that available for application today. Fortunately there is no signs of infection and overall the patient seems to be doing great. The wound bed is nice and clean. 4/27; patient presents for her second application of Apligraf. She states over the past week she has been on her feet more often due to being outside in her garden. She has noted more swelling to her  foot as a result. She denies increased warmth, pain or erythema to the wound site. Patient History Information obtained from Patient. Family History Cancer - Maternal Grandparents,Father, Diabetes - Paternal Grandparents,Maternal Grandparents, Heart Disease - Paternal Grandparents, Hypertension - Maternal Grandparents, Lung Disease - Father, Stroke - Paternal Grandparents, No family history of Hereditary Spherocytosis, Kidney Disease, Seizures, Thyroid Problems, Tuberculosis. Social History Former smoker - quit 2008, Marital Status - Married, Alcohol Use - Rarely, Drug Use - No History, Caffeine Use - Daily - soda. Medical History Eyes Denies history of Cataracts, Glaucoma, Optic Neuritis Cardiovascular Patient has history of Peripheral Venous Disease Endocrine Denies history of Type I Diabetes, Type II Diabetes Genitourinary Denies history of End Stage Renal Disease Integumentary (Skin) Denies history of History of Burn Musculoskeletal Patient has history of Osteoarthritis Denies history of Gout, Rheumatoid Arthritis, Osteomyelitis Oncologic Denies history of Received Chemotherapy, Received Radiation Psychiatric Patient has history of Confinement Anxiety Denies history of Anorexia/bulimia Hospitalization/Surgery History - post tibiotalar calcaneal arthrodesis. Objective Constitutional respirations regular, non-labored and within target range for patient.. Vitals Time Taken: 10:05 AM, Height: 68 in, Source: Stated, Temperature: 98.1 F, Pulse: 57 bpm, Respiratory Rate: 18 breaths/min, Blood Pressure: 149/71 mmHg. Cardiovascular 2+ dorsalis pedis/posterior tibialis pulses. Psychiatric pleasant and cooperative. General Notes: Left foot: Medial malleolus wound with granulation tissue present. Apligraf appears to  be incorporating. No signs of infection. There is swelling to the foot, increased from last clinic visit. Integumentary (Hair, Skin) Wound #15 status is Open. Original  cause of wound was Gradually Appeared. The date acquired was: 12/30/2019. The wound has been in treatment 36 weeks. The wound is located on the Left,Medial Malleolus. The wound measures 3.8cm length x 2.3cm width x 0.1cm depth; 6.864cm^2 area and 0.686cm^3 volume. There is Fat Layer (Subcutaneous Tissue) exposed. There is no tunneling or undermining noted. There is a medium amount of serosanguineous drainage noted. The wound margin is flat and intact. There is medium (34-66%) red granulation within the wound bed. There is a medium (34-66%) amount of necrotic tissue within the wound bed including Adherent Slough. Assessment Active Problems ICD-10 Chronic venous hypertension (idiopathic) with ulcer and inflammation of left lower extremity Non-pressure chronic ulcer of other part of left lower leg with other specified severity Non-pressure chronic ulcer of left ankle with other specified severity Patient presents for her second application of Apligraf. The wound has increased in size. The wound bed appears healthy without signs of infection. I think the increase in size is due to increased swelling to her foot since she has been standing for longer periods of time recently. We went ahead and applied the second Apligraf in standard fashion. She does not want to try a 3 layer compression wrap. She wants to continue with the Unna boot and states she will be off her feet more this week. Procedures Wound #15 Pre-procedure diagnosis of Wound #15 is a Venous Leg Ulcer located on the Left,Medial Malleolus. A skin graft procedure using a bioengineered skin substitute/cellular or tissue based product was performed by Kalman Shan, DO with the following instrument(s): Blade, Forceps, and Scissors. Apligraf was applied and secured with Steri-Strips. 22 sq cm of product was utilized and 22 sq cm was wasted due to wound size. Post Application, adaptic, drawtex was applied. A Time Out was conducted at 10:34,  prior to the start of the procedure. The procedure was tolerated well with a pain level of 1 throughout and a pain level of 1 following the procedure. Post procedure Diagnosis Wound #15: Same as Pre-Procedure . Pre-procedure diagnosis of Wound #15 is a Venous Leg Ulcer located on the Left,Medial Malleolus . There was a Haematologist Compression Therapy Procedure by [name], RN. Post procedure Diagnosis Wound #15: Same as Pre-Procedure Plan Follow-up Appointments: Return Appointment in 2 weeks. - MD for next apligraf Wed. Nurse Visit: - 1 week post apligraf Cellular or Tissue Based Products: Cellular or Tissue Based Product applied to wound bed, secured with steri-strips, cover with Adaptic or Mepitel. (DO NOT REMOVE). - Apligraf #2 Bathing/ Shower/ Hygiene: May shower with protection but do not get wound dressing(s) wet. Edema Control - Lymphedema / SCD / Other: Elevate legs to the level of the heart or above for 30 minutes daily and/or when sitting, a frequency of: - throughout the day Avoid standing for long periods of time. Exercise regularly Compression stocking or Garment 20-30 mm/Hg pressure to: - right leg daily WOUND #15: - Malleolus Wound Laterality: Left, Medial Cleanser: Soap and Water 1 x Per Week/ Discharge Instructions: May shower and wash wound with dial antibacterial soap and water prior to dressing change. Peri-Wound Care: Sween Lotion (Moisturizing lotion) 1 x Per Week/ Discharge Instructions: Apply moisturizing lotion as directed Secondary Dressing: Woven Gauze Sponge, Non-Sterile 4x4 in 1 x Per Week/ Discharge Instructions: Apply over primary dressing as directed. Secondary Dressing: ABD  Pad, 5x9 1 x Per Week/ Discharge Instructions: Apply over primary dressing as directed. Secondary Dressing: Drawtex 4x4 in 1 x Per Week/ Discharge Instructions: Apply over primary dressing as directed. Com pression Wrap: Unnaboot w/Calamine, 4x10 (in/yd) 1 x Per Week/ Discharge  Instructions: Apply Unnaboot as directed. 1. Apligraf #2 2. Unna boot 3. Follow-up in 2 week Electronic Signature(s) Signed: 09/26/2020 10:52:03 AM By: Kalman Shan DO Entered By: Kalman Shan on 09/26/2020 10:50:45 -------------------------------------------------------------------------------- HxROS Details Patient Name: Date of Service: [name], [name] 09/26/2020 10:00 A M Medical Record Number: [id number] Patient Account Number: [id number] Date of Birth/Sex: Treating RN: Jun 19, 1955 (65 y.o. [name] Provider: [name] Other Clinician: Referring Provider: Treating Provider/Extender: [name] in Treatment: 20 Information Obtained From Patient Eyes Medical History: Negative for: Cataracts; Glaucoma; Optic Neuritis Cardiovascular Medical History: Positive for: Peripheral Venous Disease Endocrine Medical History: Negative for: Type I Diabetes; Type II Diabetes Genitourinary Medical History: Negative for: End Stage Renal Disease Integumentary (Skin) Medical History: Negative for: History of Burn Musculoskeletal Medical History: Positive for: Osteoarthritis Negative for: Gout; Rheumatoid Arthritis; Osteomyelitis Oncologic Medical History: Negative for: Received Chemotherapy; Received Radiation Psychiatric Medical History: Positive for: Confinement Anxiety Negative for: Anorexia/bulimia Immunizations Pneumococcal Vaccine: Received Pneumococcal Vaccination: No Implantable Devices Yes Hospitalization / Surgery History Type of Hospitalization/Surgery post tibiotalar calcaneal arthrodesis Family and Social History Cancer: Yes - Maternal Grandparents,Father; Diabetes: Yes - Paternal Grandparents,Maternal Grandparents; Heart Disease: Yes - Paternal Grandparents; Hereditary Spherocytosis: No; Hypertension: Yes - Maternal Grandparents; Kidney Disease: No; Lung Disease: Yes - Father; Seizures: No; Stroke:  Yes - Paternal Grandparents; Thyroid Problems: No; Tuberculosis: No; Former smoker - quit 2008; Marital Status - Married; Alcohol Use: Rarely; Drug Use: No History; Caffeine Use: Daily - soda; Financial Concerns: No; Food, Clothing or Shelter Needs: No; Support System Lacking: No; Transportation Concerns: No Electronic Signature(s) Signed: 09/26/2020 10:52:03 AM By: Kalman Shan DO Signed: 09/26/2020 5:51:39 PM By: Baruch Gouty RN, BSN Entered By: Kalman Shan on 09/26/2020 10:46:28 -------------------------------------------------------------------------------- SuperBill Details Patient Name: Date of Service: [name], [name] 09/26/2020 Medical Record Number: [id number] Patient Account Number: [id number] Date of Birth/Sex: Treating RN: March 13, 1956 (65 y.o. 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

## 2020-10-03 ENCOUNTER — Other Ambulatory Visit: Payer: Self-pay

## 2020-10-03 ENCOUNTER — Encounter (HOSPITAL_BASED_OUTPATIENT_CLINIC_OR_DEPARTMENT_OTHER): Payer: Medicare Other | Attending: Physician Assistant | Admitting: Physician Assistant

## 2020-10-03 DIAGNOSIS — I87332 Chronic venous hypertension (idiopathic) with ulcer and inflammation of left lower extremity: Secondary | ICD-10-CM | POA: Diagnosis present

## 2020-10-03 DIAGNOSIS — L97322 Non-pressure chronic ulcer of left ankle with fat layer exposed: Secondary | ICD-10-CM | POA: Insufficient documentation

## 2020-10-05 NOTE — Progress Notes (Signed)
[name], [name] ([id number]) Visit Report for 10/03/2020 SuperBill Details Patient Name: Date of Service: [name], [name] 10/03/2020 Medical Record Number: [id number] Patient Account Number: [id number] Date of Birth/Sex: Treating RN: 01-21-1956 (65 y.o. 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

## 2020-10-05 NOTE — Progress Notes (Signed)
[name], DIMANCHE ([id number]) Visit Report for 10/03/2020 Arrival Information Details Patient Name: Date of Service: [facility], [name] 10/03/2020 10:00 [place] Record Number: [id number] Patient Account Number: [id number] Date of Birth/Sex: Treating RN: [birth year] (65 y.o. [name], [name] Primary Care Gabrial Domine: [name] Other Clinician: Referring Gerre Ranum: Treating Justen Fonda/Extender: [name] in Treatment: 61 Visit Information History Since Last Visit Added or deleted any medications: No Patient Arrived: Cane Any new allergies or adverse reactions: No Arrival Time: 10:58 Had a fall or experienced change in No Accompanied By: self activities of daily living that may affect Transfer Assistance: None risk of falls: Patient Identification Verified: Yes Signs or symptoms of abuse/neglect since last visito No Secondary Verification Process Completed: Yes Hospitalized since last visit: No Patient Requires Transmission-Based Precautions: No Implantable device outside of the clinic excluding No Patient Has Alerts: Yes cellular tissue based products placed in the center Patient Alerts: L ABI =1.12, TBI = .92 since last visit: R ABI= 1.02, TBI= .58 Has Dressing in Place as Prescribed: Yes Pain Present Now: Yes Electronic Signature(s) Signed: 10/05/2020 3:14:58 PM By: [name] RN Entered By: [name] on 10/03/2020 10:58:40 -------------------------------------------------------------------------------- Compression Therapy Details Patient Name: Date of Service: [name], [name] 10/03/2020 10:00 A M Medical Record Number: [id number] Patient Account Number: [id number] Date of Birth/Sex: Treating RN: [birth year] (65 y.o. [name] Primary Care Ranita Stjulien: [name] Other Clinician: Referring Aarish Rockers: Treating Jenasis Straley/Extender: [name] in Treatment: 37 Compression Therapy Performed for  Wound Assessment: Wound #15 Left,Medial Malleolus Performed By: Clinician [name], RN Compression Type: Rolena Infante Electronic Signature(s) Signed: 10/05/2020 3:14:58 PM By: [name] RN Entered By: [name] on 10/03/2020 10:59:59 -------------------------------------------------------------------------------- Encounter Discharge Information Details Patient Name: Date of Service: [name], [name] 10/03/2020 10:00 [place] Record Number: [id number] Patient Account Number: [id number] Date of Birth/Sex: Treating RN: 11-01-1955 (65 y.o. [name] Primary Care Najla Aughenbaugh: Other Clinician: [name] Referring Jerelene Salaam: Treating Antinette Keough/Extender: [name] in Treatment: [phone] Encounter Discharge Information Items Discharge Condition: Stable Ambulatory Status: Cane Discharge Destination: Home Transportation: Private Auto Accompanied By: self Schedule Follow-up Appointment: Yes Clinical Summary of Care: Patient Declined Electronic Signature(s) Signed: 10/05/2020 3:14:58 PM By: [name] RN Entered By: [name] on 10/03/2020 11:00:40 -------------------------------------------------------------------------------- Patient/Caregiver Education Details Patient Name: Date of Service: [name] [name], [name] 5/4/2022andnbsp10:00 A M Medical Record Number: [id number] Patient Account Number: [id number] Date of Birth/Gender: Treating RN: 1956-02-13 (65 y.o. [name] Primary Care Physician: [name] Other Clinician: Referring Physician: Treating Physician/Extender: [name] in Treatment: 61 Education Assessment Education Provided To: Patient Education Topics Provided Wound/Skin Impairment: Methods: Explain/Verbal Responses: State content correctly Electronic Signature(s) Signed: 10/05/2020 3:14:58 PM By: [name] RN Entered By: [name] on  10/03/2020 11:00:27 -------------------------------------------------------------------------------- Wound Assessment Details Patient Name: Date of Service: [name], [name] 10/03/2020 10:00 A M Medical Record Number: [id number] Patient Account Number: [id number] Date of Birth/Sex: Treating RN: 1955/08/29 (66 y.o. [name], [name] Primary Care [name]: [name] Other Clinician: Referring [name]: Treating [name]/Extender: [name] Weeks in Treatment: 37 Wound Status Wound Number: 15 Primary Etiology: Venous Leg Ulcer Wound Location: Left, Medial Malleolus Wound Status: Open Wounding Event: Gradually Appeared Date Acquired: 12/30/2019 Weeks Of Treatment: 37 Clustered Wound: No Wound Measurements Length: (cm) 3.8 Width: (cm) 2.3 Depth: (cm) 0.1  Area: (cm) 6.864 Volume: (cm) 0.686 % Reduction in Area: 41.1% % Reduction in Volume: 92.6% Wound Description Classification: Full Thickness With Exposed Support Structures Treatment Notes Wound #15 (Malleolus) Wound Laterality: Left, Medial Cleanser Soap and Water Discharge Instruction: May shower and wash wound with dial antibacterial soap and water prior to dressing change. Peri-Wound Care Sween Lotion (Moisturizing lotion) Discharge Instruction: Apply moisturizing lotion as directed Topical Primary Dressing Secondary Dressing Woven Gauze Sponge, Non-Sterile 4x4 in Discharge Instruction: Apply over primary dressing as directed. ABD Pad, 5x9 Discharge Instruction: Apply over primary dressing as directed. Drawtex 4x4 in Discharge Instruction: Apply over primary dressing as directed. Secured With Compression Wrap Unnaboot w/Calamine, 4x10 (in/yd) Discharge Instruction: Apply Unnaboot as directed. Compression Stockings Add-Ons Electronic Signature(s) Signed: 10/05/2020 3:14:58 PM By: [name] RN Entered By: [name] on 10/03/2020  10:59:40 -------------------------------------------------------------------------------- Vitals Details Patient Name: Date of Service: [name], [name] 10/03/2020 10:00 [place] Record Number: [id number] Patient Account Number: [id number] Date of Birth/Sex: Treating RN: [birth year] (65 y.o. [name], [name] Primary Care [name]: [name] Other Clinician: Referring Latrise Bowland: Treating Anetta Olvera/Extender: [name] in Treatment: 37 Vital Signs Time Taken: 10:58 Temperature (F): 98.7 Height (in): 68 Pulse (bpm): 74 Respiratory Rate (breaths/min): 17 Blood Pressure (mmHg): 107/64 Reference Range: 80 - 120 mg / dl Electronic Signature(s) Signed: 10/05/2020 3:14:58 PM By: [name] RN Entered By: [name] on 10/03/2020 10:59:07

## 2020-10-10 ENCOUNTER — Encounter (HOSPITAL_BASED_OUTPATIENT_CLINIC_OR_DEPARTMENT_OTHER): Payer: Medicare Other | Admitting: Physician Assistant

## 2020-10-10 ENCOUNTER — Other Ambulatory Visit (HOSPITAL_COMMUNITY)
Admission: RE | Admit: 2020-10-10 | Discharge: 2020-10-10 | Disposition: A | Discharge status: Home / Self Care | Payer: Medicare Other

## 2020-10-10 ENCOUNTER — Other Ambulatory Visit: Payer: Self-pay

## 2020-10-10 DIAGNOSIS — I87332 Chronic venous hypertension (idiopathic) with ulcer and inflammation of left lower extremity: Secondary | ICD-10-CM | POA: Diagnosis not present

## 2020-10-10 DIAGNOSIS — B999 Unspecified infectious disease: Secondary | ICD-10-CM | POA: Insufficient documentation

## 2020-10-10 NOTE — Progress Notes (Addendum)
[name], [name] ([id number]) Visit Report for 10/10/2020 Chief Complaint Document Details Patient Name: Date of Service: [facility] [name] 10/10/2020 10:00 A M Medical Record Number: [id number] Patient Account Number: [id number] Date of Birth/Sex: Treating RN: Aug 27, 1955 (65 y.o. [name] Primary Care Provider: Lennie [name] Other Clinician: Referring Provider: Treating Provider/Extender: [name] in Treatment: 66 Information Obtained from: Patient Chief Complaint patient is been followed long-term in this clinic for venous insufficiency ulcers with inflammation, hypertension and ulceration over the medial ankle bilaterally. 01/17/2020; this is a patient who is here for review of postoperative wounds on the left lateral ankle and recurrence of venous stasis ulceration on the left medial Electronic Signature(s) Signed: 10/10/2020 11:20:37 AM By: [name] Keeler PA-C Entered By: [name] on 10/10/2020 11:20:36 -------------------------------------------------------------------------------- HPI Details Patient Name: Date of Service: [name]. 10/10/2020 10:00 A M Medical Record Number: [id number] Patient Account Number: [id number] Date of Birth/Sex: Treating RN: Oct 27, 1955 (65 y.o. [name] Primary Care Provider: Lennie [name] Other Clinician: Referring Provider: Treating Provider/Extender: [name] in Treatment: 73 History of Present Illness HPI Description: the remaining wound is over the left medial ankle. Similar wound over the right medial ankle healed largely with use of Apligraf. Most recently we have been using Hydrofera Blue over this wound with considerable improvement. The patient has been extensively worked up in the past for her venous insufficiency and she is not a candidate for antireflux surgery although I have none of the details available currently. 08/24/14; considerable  improvement today. About 50% of this wound areas now epithelialized. The base of the wound appears to be healthier granulation.as opposed to last week when she had deteriorated a considerable improvement 08/17/14; unfortunately the wound has regressed somewhat. The areas of epithelialization from the superior aspect are not nearly as healthy as they were last week. The patient thinks her Hydrofera Blue slipped. 09/07/14; unfortunately the area has markedly regressed in the 2 weeks since I've seen this. There is an [name] surrounding erythema. The healthy granulation tissue that we had at the base of the wound now is a dusky color. The nurse reports green drainage 09/14/14; the area looks somewhat better than last week. There is less erythema and less drainage. The culture I did did not show any growth. Nevertheless I think it is better to continue the Cipro and doxycycline for a further week. The remaining wound area was debridement. 09/21/14. Wound did not require debridement last week. Still less erythema and less drainage. She can complete her antibiotics. The areas of epithelialization in the superior aspect of the wound do not look as healthy as they did some weeks ago 10/05/14 continued improvement in the condition of this wound. There is advancing epithelialization. Less aggressive debridement required 10/19/14 continued improvement in the condition and volume of this wound. Less aggressive debridement to the inferior part of this to remove surface slough and fibrinous eschar 11/02/14 no debridement is required. The surface granulation appears healthy although some of her islands of epithelialization seem to have regressed. No evidence of infection 11/16/14; lites surface debridement done of surface eschar. The wound does not look to be unhealthy. No evidence of infection. Unfortunately the patient has had podiatry issues in the right foot and for some reason has redeveloped small surface ulcerations in  the medial right ankle. Her original presentation involved wounds in this area 11/23/14 no debridement. The area on the  right ankle has enlarged. The left ankle wound appears stable in terms of the surface although there is periwound inflammation. There has been regression in the amount of new skin 11/30/14 no debridement. Both wound areas appear healthy. There was no evidence of infection. The the new area on the right medial ankle has enlarged although that both the surfaces appear to be stable. 12/07/14; Debridement of the right medial ankle wound. No no debridement was done on the left. 12/14/14 no major change in and now bilateral medial ankle wounds. Both of these are very painful but the no overt evidence of infection. She has had previous venous ablation 12/21/14; patient states that her right medial ankle wound is considerably more painful last week than usual. Her left is also somewhat painful. She could not tolerate debridement. The right medial ankle wound has fibrinous surface eschar 12/28/14 this is a patient with severe bilateral venous insufficiency ulcers. For a considerable period of time we actually had the one on the right medial ankle healed however this recently opened up again in June. The left medial ankle wound has been a refractory area with some absent flows. We had some success with Hydrofera Blue on this area and it literally closed by 50% however it is recently opened up Foley. Both of these were debridement today of surface eschar. She tolerates this poorly 01/25/15: No change in the status of this. Thick adherent escar. Very poor tolerance of any attempt at debridement. I had healed the right medial malleolus wound for a considerable amount of time and had the left one down to about 50% of the volume although this is totally regressed over the last 48 weeks. Further the right leg has reopened. she is trying to make a appointment with pain and vascular, previous ablations with  Dr. Aleda [name]. I do not believe there is an arterial insufficiency issue here 02/01/15 the status of the adherent eschar bilaterally is actually improved. No debridement was done. She did not manage to get vascular studies done 02/08/15 continued debridement of the area was done today. The slough is less adherent and comes off with less pressure. There is no surrounding infection peripheral pulses are intact 02/15/15 selective debridement with a disposable curette. Again the slough is less adherent and comes off with less difficulty. No surrounding infection peripheral pulses are intact. 02/22/15 selective debridement of the right medial ankle wound. Slough comes off with less difficulty. No obvious surrounding infection peripheral pulses are intact I did not debridement the one on the left. Both of these are stable to improved 03/01/15 selective debridement of both wound areas using a curette to. Adherent slough cup soft with less difficulty. No obvious surrounding infection. The patient tells me that 2 days ago she noted a rash above the right leg wrap. She did not have this on her lower legs when she change this over she arrives with widespread left greater than right almost folliculitis-looking rash which is extremely pruritic. I don't see anything to culture here. There is no rash on the rest of her body. She feels well systemically. 03/08/15; selective debridement of both wounds using a curette. Base of this does not look unhealthy. She had limegreen drainage coming out of the left leg wound and describes a lot of drainage. The rash on her left leg looks improved to. No cultures were done. 03/22/15; patient was not here last week. Basal wounds does not look healthy and there is no surrounding erythema. No drainage. There is still a  rash on the left leg that almost looks vasculitic however it is clearly limited to the top of where the wrap would be. 04/05/15; on the right required a surgical  debridement of surface eschar and necrotic subcutaneous tissue. I did not debridement the area on the left. These continue to be large open wounds that are not changing that much. We were successful at one point in healing the area on the right, and at the same time the area on the left was roughly half the size of current measurements. I think a lot of the deterioration has to do with the prolonged time the patient is on her feet at work 04/19/15 I attempted-like surface debridement bilaterally she does not tolerate this. She tells me that she was in allergic care yesterday with extreme pain over her left lateral malleolus/ankle and was told that she has an "sprain" 05/03/15; large bilateral venous insufficiency wounds over the medial malleolus/medial aspect of her ankles. She complains of copious amounts of drainage and his usual large amounts of pain. There is some increasing erythema around the wound on the right extending into the medial aspect of her foot to. historically she came in with these wounds the right one healed and the left one came down to roughly half its current size however the right one is reopened and the left is expanded. 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 05/10/15 large bilateral venous insufficiency wounds. There is less adherence surface left however the surface culture that I did last week grew pseudomonas therefore bilateral selective debridement score necessary. There is surrounding erythema. The patient describes severe bilateral drainage and a lot of pain in the left ankle. Apparently her podiatrist was were ready to do a cortisone shot 05/17/15; the patient complains of pain and again copious amounts of drainage. 05/24/15; we used Iodo flex last week. Patient notes considerable improvement in wound drainage. Only needed to change this once. 05/31/15; we continued Iodoflex; the base of these large wounds  bilaterally is not too bad but there is probably likely a significant bioburden here. I would like to debridement just doesn't tolerate it. 06/06/14 I would like to continue the Iodoflex although she still hasn't managed to obtain supplies. She has bilateral medial malleoli or large wounds which are mostly superficial. Both of them are covered circumferentially with some nonviable fibrinous slough although she tolerates debridement very poorly. She apparently has an appointment for an ablation on the right leg by interventional radiology. 06/14/15; the patient arrives with the wounds and static condition. We attempted a debridement although she does not do well with this secondary to pain. I 07/05/15; wounds are not much smaller however there appears to be a cleaner granulating base. The left has tight fibrinous slough greater than the right. Debridement is tolerated poorly due to pain. Iodoflex is done more for these wounds in any of the multitude of different dressings I have tried on the left 1 and then subsequently the right. 07/12/15; no change in the condition of this wound. I am able to do an aggressive debridement on the right but not the left. She simply cannot tolerate it. We have been using Iodoflex which helps somewhat. It is worthwhile remembering that at one point we healed the right medial ankle wound and the left was about 25% of the current circumference. We have suggested returning to vascular surgery for review of possible further ablations for  one reason or another she has not been able to do this. 07/26/15 no major change in the condition of either wound on her medial ankle. I did not attempt to debridement of these. She has been aggressively scrubbing these while she is in the shower at home. She has her supply of Iodoflex which seems to have done more for these wounds then anything I have put on recently. 08/09/15 wound area appears larger although not verified by measurements. Using  Iodoflex 09/05/2015 -- she was here for avisit today but had significant problems with the wound and I was asked to see her for a physician opinion. I have summarize that this lady has had surgery on her left lower extremity about 10 years ago where the possible veins stripping was done. She has had an opinion from interventional radiology around November 2016 where no further sclerotherapy was ordered. The patient works 12 hours a day and stands on a concrete floor with work boots and is unable to get the proper compression she requires and cannot elevate her limbs appropriately at any given time. She has recently grown Pseudomonas from her wound culture but has not started her ciprofloxacin which was called in for her. 09/13/15 this continues to be a difficult situation for this patient. At one point I had this wound down to a 1.5 x 1.5" wound on her left leg. This is deteriorated and the right leg has reopened. She now has substantial wounds on her medial calcaneus, malleoli and into her lower leg. One on the left has surface eschar but these are far too painful for me to debridement here. She has a vascular surgery appointment next week to see if anything can be done to help here. I think she has had previous ablations several years ago at [facility]. She has no major edema. She tells me that she did not get product last time [facility] and went for several days without it. She continues to work in work boots 12 hours a day. She cannot get compression/4-layer under her work boots. 09/20/15 no major change. Periwound edema control was not very good. Her point with pain and vascular is next Wednesday the 25th 09/28/15; the patient is seen vascular surgery and is apparently scheduled for repeat duplex ultrasounds of her bilateral lower legs next week. 10/05/15; the patient was seen by Dr. Doren Custard of vascular surgery. He feels that she should have arterial insufficiency excluded as cause/contributed to  her nonhealing stage she is therefore booked for an arteriogram. She has apparently monophasic signals in the dorsalis pedis pulses. She also of course has known severe chronic venous insufficiency with previous procedures as noted previously. I had another long discussion with the patient today about her continuing to work 12 hour shifts. I've written her out for 2 months area had concerns about this as her work location is currently undergoing significant turmoil and this may lead to her termination. She is aware of this however I agree with her that she simply cannot continue to stand for 12 hours multiple days a week with the substantial wound areas she has. 10/19/15; the Dr. Doren Custard appointment was largely for an arteriogram which was normal. She does not have an arterial issue. He didn't make a comment about her chronic venous insufficiency for which she has had previous ablations. Presumably it was not felt that anything additional could be done. The patient is now out of work as I prescribed 2 weeks ago. Her wounds look somewhat less  aggravated presumably because of this. I felt I would give debridement another try today 10/25/15; no major change in this patient's wounds. We are struggling to get her product that she can afford into her own home through her insurance. 11/01/15; no major change in the patient's wounds. I have been using silver alginate as the most affordable product. I spoke to Dr. Marla Roe last week with her requested take her to the OR for surgical debridement and placement of ACEL. Dr. Marla Roe told me that she would be willing to do this however [facility] will not cover this, fortunately the patient has [place] healthcare of some variant 11/08/15; no major change in the patient's wounds. She has been completely nonviable surface that this but is in too much pain with any attempted debridement are clinic. I have arranged for her to see Dr. Marla Roe ham of plastic  surgery and this appointment is on Monday. I am hopeful that they will take her to the OR for debridement, possible ACEL ultimately possible skin graft 11/22/15 no major change in the patient's wounds over her bilateral medial calcaneus medial malleolus into the lower legs. Surface on these does not look too bad however on the left there is surrounding erythema and tenderness. This may be cellulitis or could him sleepy tinea. 11/29/15; no major changes in the patient's wounds over her bilateral medial malleolus. There is no infection here and I don't think any additional antibiotics are necessary. There is now plan to move forward. She sees Dr. Marla Roe in a week's time for preparation for operative debridement and ACEL placement I believe on 7/12. She then has a follow-up appointment with Dr. Marla Roe on 7/21 12/28/15; the patient returns today having been taken to the [place] by Dr. Marla Roe 12/12/15 she underwent debridement, intraoperative cultures [which were negative]. She had placement of a wound VAC. Parent really ACEL was not available to be placed. The wound VAC foam apparently adhered to the wound since then she's been using silver alginate, Xeroform under Ace wraps. She still says there is a lot of drainage and a lot of pain 01/31/16; this is a patient I see monthly. I had referred her to Dr. Marla Roe him of plastic surgery for large wounds on her bilateral medial ankles. She has been to the OR twice once in early July and once in early August. She tells me over the last 3 weeks she has been using the wound VAC with ACEL underneath it. On the right we've simply been using silver alginate. Under Kerlix Coban wraps. 02/28/16; this is a patient I'm currently seeing monthly. She is gone on to have a skin graft over her large venous insufficiency ulcer on the left medial ankle. This was done by Dr. Marla Roe him. The patient is a bit perturbed about why she didn't have one on her right medial  ankle wound. She has been using silver alginate to this. 03/06/16; I received a phone call from her plastic surgery Dr. Marla Roe. She expressed some concern about the viability of the skin graft she did on the left medial ankle wound. Asked me to place Endoform on this. She told me she is not planning to do a subsequent skin graft on the right as the left one did not take very well. I had placed Hydrofera Blue on the right 03/13/16; continue to have a reasonably healthy wound on the right medial ankle. Down to 3 mm in terms of size. There is epithelialization here. The area on the  left medial ankle is her skin graft site. I suppose the last week this looks somewhat better. She has an open area inferiorly however in the center there appears to be some viable tissue. There is a lot of surface callus and eschar that will eventually need to come off however none of this looked to be infected. Patient states that the is able to keep the dressing on for several days which is an improvement. 03/20/16 no major change in the circumference of either wound however on the left side the patient was at Dr. Eusebio Friendly office and they did a debridement of left wound. 50% of the wound seems to be epithelialized. I been using Endoform on the left Hydrofera Blue in the right 03/27/16; she arrives today with her wound is not looking as healthy as they did last week. The area on the right clearly has an adherent surface to this a very similar surface on the left. Unfortunately for this patient this is all too familiar problem. Clearly the Endoform is not working and will need to change that today that has some potential to help this surface. She does not tolerate debridement in this clinic very well. She is changing the dressing wants 04/03/16; patient arrives with the wounds looking somewhat better especially on the right. Dr. Migdalia Dk change the dressing to silver alginate when she saw her on Monday and also sold her  some compression socks. The usefulness of the latter is really not clear and woman with severely draining wounds. 04/10/16; the patient is doing a bit of an experiment wearing the compression stockings that Dr. Migdalia Dk provided her to her left leg and the out of legs based dressings that we provided to the right. 05/01/16; the patient is continuing to wear compression stockings Dr. Migdalia Dk provided her on the left that are apparently silver impregnated. She has been using Iodoflex to the right leg wound. Still a moderate amount of drainage, when she leaves here the wraps only last for 4 days. She has to change the stocking on the left leg every night 05/15/16; she is now using compression stockings bilaterally provided by Dr. Marla Roe. She is wearing a nonadherent layer over the wounds so really I don't think there is anything specific being done to this now. She has some reduction on the left wound. The right is stable. I think all healing here is being done without a specific dressing 06/09/16; patient arrives here today with not much change in the wound certainly in diameter to large circular wounds over the medial aspect of her ankle bilaterally. Under the light of these services are certainly not viable for healing. There is no evidence of surrounding infection. She is wearing compression stockings with some sort of silver impregnation as prescribed by Dr. Marla Roe. She has a follow-up with her tomorrow. 06/30/16; no major change in the size or condition of her wounds. These are still probably covered with a nonviable surface. She is using only her purchase stockings. She did see Dr. Marla Roe who seemed to want to apply Dakin's solution to this I'm not extreme short what value this would be. I would suggest Iodoflex which she still has at home. 07/28/16; I follow Mrs. Risk episodically along with Dr. Marla Roe. She has very refractory venous insufficiency wounds on her bilateral medial legs  left greater than right. She has been applying a topical collagen ointment to both wounds with Adaptic. I don't think Dr. Marla Roe is planning to take her back to the OR. 08/19/16;  I follow Mrs. Jeneen Rinks on a monthly basis along with Dr. Marla Roe of plastic surgery. 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 I been following her for a number of years. At one point I was able to get the right medial malleolus wound to heal and had the left medial malleolus down to about half its current size however and I had to send her to plastic surgery for an operative debridement. Since then things have been stable to slightly improve the area on the right is slightly better one in the left about the same although there is much less adherent surface than I'm used to with this patient. She is using some form of liquid collagen gel that Dr. Marla Roe provided a Kerlix cover with the patient's own pressure stockings. She tells me that she has extreme pain in both ankles and along the lateral aspect of both feet. She has been unable to work for some period of time. She is telling me she is retiring at the beginning of April. She sees Dr. Doran Durand of orthopedics next week 09/22/16; patient has not seen Dr. Marla Roe since the last time she is here. I'm not really sure what she is using to the wounds other than bits and pieces of think she had left over including most recently Hydrofera Blue. She is using juxtalite stockings. She is having difficulty with her husband's recent illness "stroke". She is having to transport him to various doctors appointments. Dr. Marla Roe left her the option of a repeat debridement with ACEL however she has not been able to get the time to follow-up on this. She continues to have a fair amount of drainage out of these wounds with certainly precludes leaving dressings on all week 10/13/16; patient has not seen Dr.  Marla Roe since she was last in our clinic. I'm not really sure what she is doing with the wounds, we did try to get her [facility] and I think she is actually using this most of the time. Because of drainage she states she has to change this every second day although this is an improvement from what she used to do. She went to see Dr. Doran Durand who did not think she had a muscular issue with regards to her feet, he referred her to a neurologist and I think the appointment is sometime in June. I changed her back to Iodoflex which she has used in the past but not recently. 11/03/16; the patient has been using Iodoflex although she ran out of this. Still claims that there is a lot of drainage although the wound does not look like this. No surrounding erythema. She has not been back to see Dr. Marla Roe 11/24/16; the patient has been using Iodoflex again but she ran out of it 2 or 3 days ago. There is no major change in the condition of either one of these wounds in fact they are larger and covered in a thick adherent surface slough/nonviable tissue especially on the left. She does not tolerate mechanical debridement in our clinic. Going back to see Dr. Marla Roe of plastic surgery for an operative debridement would seem reasonable. 12/15/16; the patient has not been back to see Dr. Marla Roe. She is been dealing with a series of illnesses and her husband which of monopolized her time. She is been using Sorbact which we largely supplied. She states the drainage is bad enough that it maximum she can go 2-3 days without changing the  dressing 01/12/2017 -- the patient has not been back for about 4 weeks and has not seen Dr. Marla Roe not does she have any appointment pending. 01/23/17; patient has not seen Dr. Marla Roe even though I suggested this previously. She is using Santyl that was suggested last week by Dr. Con Memos this Cost her $16 through her insurance which is indeed surprising 02/12/17;  continuing Santyl and the patient is changing this daily. A lot of drainage. She has not been back to see plastic surgery she is using an Ace wrap. Our intake nurse suggested wrap around stockings which would make a good reasonable alternative 02/26/17; patient is been using Santyl and changing this daily due to drainage. She has not been to see plastic surgery she uses in April Ace wrap to control the edema. She did obtain extremitease stockings but stated that the edema in her leg was to big for these 03/20/17; patient is using Santyl and Anasept. Surfaces looked better today the area on the right is actually measuring a little smaller. She has states she has a lot of pain in her feet and ankles and is asking for a consult to pain control which I'll try to help her with through our case manager. 04/10/17; the patient arrives with better-looking wound surfaces and is slightly smaller wound on the left she is using a combination of Santyl and Anasept. She has an appointment or at least as started in the pain control center associated with [facility] 05/14/17; this is a patient who I followed for a prolonged period of time. 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 At one point I had this down to a much smaller wound on the left however these reopened and we've never been able to get these to heal. She has been using Santyl and Anasept gel although 2 weeks ago she ran out of the Anasept gel. She has a stable appearance of the wound. She is going to the wound care clinic at [facility]. They wanted do a nerve block/spinal block although she tells me she is reluctant to go forward with that. 05/21/17; this is a patient I have followed for many years. 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 Chronic pain and deformity in her ankles as well. She is been to see plastic surgery as well as orthopedics. Using PolyMem AG most recently/Kerramax/ABDs  and 2 layer compression. She has managed to keep this on and she is coming in for a nurse check to change the dressing on Tuesdays, we see her on Fridays 06/05/17; really quite a good looking surface and the area especially on the right medial has contracted in terms of dimensions. Well granulated healthy-looking tissue on both sides. Even with an open curet there is nothing that even feels abnormal here. This is as good as I've seen this in quite some time. We have been using PolyMem AG and bringing her in for a nurse check 06/12/17; really quite good surface on both of these wounds. The right medial has contracted a bit left is not. We've been using PolyMem and AG and she is coming in for a nurse visit 06/19/17; we have been using PolyMem AG and bringing her in for a nurse check. Dimensions of her wounds are not better but the surfaces looked better bilaterally. She complained of bleeding last night and the left wound and increasing pain bilaterally. She states her wound pain is more neuropathic than just the wounds. There was some  suggestion that this was radicular from her pain management doctor in talking to her it is really difficult to sort this out. 06/26/17; using PolyMem and AG and bringing her in for a nurse check as All of this and reasonably stable condition. Certainly not improved. The dimensions on the lateral part of the right leg look better but not really measuring better. The medial aspect on the left is about the same. 07/03/16; we have been using PolyMen AG and bringing her in for a nurse check to change the dressings as the wounds have drainage which precludes once weekly changing. We are using all secondary absorptive dressings.our intake nurse is brought up the idea of using a wound VAC/snap VAC on the wound to help with the drainage to see if this would result in some contraction. This is not a bad idea. The area on the right medial is actually looking smaller. Both wounds have a  reasonable-looking surface. There is no evidence of cellulitis. The edema is well controlled 07/10/17; the patient was denied for a snap VAC by her insurance. The major issue with these wounds continues to be drainage. We are using wicked PolyMem AG and she is coming in for a nurse visit to change this. The wounds are stable to slightly improved. The surface looks vibrant and the area on the right certainly has shrunk in size but very slowly 07/17/17; the patient still has large wounds on her bilateral medial malleoli. Surface of both of these wounds looks better. The dimensions seem to come and go but no consistent improvement. There is no epithelialization. We do not have options for advanced treatment products due to insurance issues. They did not approve of the wound VAC to help control the drainage. More recently we've been using PolyMem and AG wicked to allow drainage through. We have been bringing her in for a nurse visit to change this. We do not have a lot of options for wound care products and the home again due to insurance issues 07/24/17; the patient's wound actually looks somewhat better today. No drainage measurements are smaller still healthy-looking surface. We used silver collagen under PolyMen started last week. We have been bringing her in for a dressing change 07/31/17; patient's wound surface continued to look better and I think there is visible change in the dimensions of the wound on the right. Rims of epithelialization. We have been using silver collagen under PolyMen and bringing her in for a dressing change. There appears to be less drainage although she is still in need of the dressing change 08/07/17. Patient's wound surface continues to look better on both sides and the area on the right is definitely smaller. We have been using silver collagen and PolyMen. She feels that the drainage has been it has been better. I asked her about her vascular status. She went to see Dr.  Aleda [name] at [facility] and had some form of ablation. I don't have much detail on this. I haven't my notes from 2016 that she was not a candidate for any further ablation but I don't have any more information on this. We had referred her to vein and vascular I don't think she ever went. He does not have a history of PAD although I don't have any information on this either. We don't even have ABIs in our record 08/14/17; we've been using silver collagen and PolyMen cover. And putting the patient and compression. She we are bringing her in as a nurse visit to change  this because ofarge amount of drainage. We didn't the ABIs in clinic today since they had been done in many moons 1.2 bilaterally. She has been to see vein and vascular however this was at [facility] and she had ablation although I really don't have any information on this all seemed biking get a report. She is also been operatively debrided by plastic surgery and had a cell placed probably 8-12 months ago. This didn't have a major effect. We've been making some gains with current dressings 08/19/17-She is here in follow-up evaluation for bilateral medial malleoli ulcers. She continues to tolerate debridement very poorly. We will continue with recently changed topical treatment; if no significant improvement may consider switching to Iodosorb/Iodoflex. 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 She has been using silver collagen and PolyMem. I believe she has been used and tried on Iodoflex before. 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 These have been very refractory over time. We have been using silver collagen and PolyMen. She is coming in once a week for a doctor's and once a week for  nurse visits. We are actually making some progress 09/18/17; the patient's wounds are smaller especially on the right medial. She arrives today to upset to consider even washing these off with Anasept which I think is been part of the reason this is been closing. We've been using collagen covered in PolyMen otherwise. It is noted that she has a small area of folliculitis on the right medial calf that. As we are wrapping her legs I'll give her a short course of doxycycline to make sure this doesn't amount to anything. She is a long list of complaints today including imbalance, shortness of breath on exertion, inversion of her left ankle. With regards to the latter complaints she is been to see orthopedics and they offered her a tendon release surgery I believe but wanted her wounds to be closed first. I have recommended she go see her primary physician with regards to everything else. 09/25/17; patient's wounds are about the same size. We have made some progress bilaterally although not in recent weeks. She will not allow me T wash these wounds with Anasept even if she is doing her cell. Wheeze we've been using collagen covered in PolyMen. Last week she had a small area of folliculitis this is now opened into a small wound. She completed 5 days of trimethoprim sulfamethoxazole 10/02/17; unfortunately the area on her left medial ankle is worse with a larger wound area towards the Achilles. The patient complains of a lot of pain. She will not allow debridement although visually I don't think there is anything to debridement in any case. We have been using silver collagen and PolyMen for several months now. Initially we are making some progress although I'm not really seeing that today. We will move back to [facility]. His admittedly this is a bit of a repeat however I'm hoping that his situation is different now. The patient tells me she had her leg on the left give out on her yesterday this is process  some pain. 10/09/17; the patient is seen twice a week largely because of drainage issues coming out of the chronic medial bimalleolar wounds that are chronic. Last week the dimensions of the one on the  left looks a little larger I changed her to [facility]. She comes in today with a history of terrible pain in the bilateral wound areas. She will not allow debridement. She will not even allow a tissue culture. There is no surrounding erythema no no evidence of cellulitis. We have been putting her Kerlix Coban man. She will not allow more aggressive compression as there was a suggestion to put her in 3 layer wraps. 10/16/17; large wounds on her bilateral medial malleoli. These are chronic. Not much change from last week. The surface looks have healthy but absolutely no epithelialization. A lot of pain little less so of drainage. She will not allow debridement or even washing these off in the vigorous fashion with Anasept. 10/23/17; large wounds on her bilateral malleoli which are chronic. Some improvement in terms of size perhaps on the right since last time I saw these. She states that after we increased the 3 layer compression there was some bleeding, when she came in for a nurse visit she did not want 3 layer compression put back on about our nurse managed to convince her. She has known chronic venous visit issues and I'm hoping to get her to tolerate the 3 layer compression. using Hydrofera Blue 10/30/17; absolutely no change in the condition of either wound although we've had some improvement in dimensions on the right.. Attempted to put her in 3 layer compression she didn't tolerated she is back in 2 layer compression. We've been using Hydrofera Blue We looked over her past records. She had venous reflux studies in November 2016. There was no evidence of deep venous reflux on the right. Superficial vein did not show the greater saphenous vein at think this is been previously ablated the small  saphenous vein was within normal limits. The left deep venous system showed no DVT the vessels were positive for deep venous reflux in the posterior tibial veins at the ankle. The greater saphenous vein was surgically absent small saphenous vein was within normal limits. She went to vein and vascular at [facility]. I believe she had an ablation on the left greater saphenous vein. I'll update her reflux studies perhaps ever reviewed by vein and vascular. We've made absolutely no progress in these wounds. Will also try to read and TheraSkins through her insurance 11/06/17; W the patient apparently has a 2 week follow-up with vein and vascular I like him to review the whole issue with regards to her previous vascular workup by Dr. Aleda [name]. We've really made no progress on these wounds in many months. She arrives today with less viable looking surface on the left medial ankle wound. This was apparently looking about the same on Tuesday when she was here for nurse visit. 11/13/17; deep tissue culture I did last time of the left lower leg showed multiple organisms without any predominating. In particular no Staphylococcus or group A strep were isolated. We sent her for venous reflux studies. She's had a previous left greater saphenous vein stripping and I think sclerotherapy of the right greater saphenous vein. She didn't really look at the lesser saphenous vein this both wounds are on the medial aspect. She has reflux in the common femoral vein and popliteal vein and an accessory vein on the right and the common femoral vein and popliteal vein on the left. I'm going to have her go to see vein and vascular just the look over things and see if anything else beside aggressive compression is indicated here. We have not  been able to make any progress on these wounds in spite of the fact that the surface of the wounds is never look too bad. 11/20/17; no major change in the condition of the wounds. Patient  reports a large amount of drainage. She has a lot of complaints of pain although enlisting her today I wonder if some of this at least his neuropathic rather than secondary to her wounds. She has an appointment with vein and vascular on 12/30/17. The refractory nature of these wounds in my mind at least need vein and vascular to look over the wounds the recent reflux studies we did and her history to see if anything further can be done here. I also note her gait is deteriorated quite a bit. 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 She has a bilateral Trendelenburg gait. I wonder if this is neuropathic or perhaps multilevel radicular. 11/27/17; her wounds actually looks slightly better. Healthy-looking granulation tissue a scant amount of epithelialization. [place] [employer] will not pay for [employer]. They will play for tri layer Oasis and Dermagraft. This is not a diabetic ulcer. We'll try for the tri layer Oasis. She still complains of some drainage. She has a vein and vascular appointment on 12/30/17 12/04/17; the wounds visually look quite good. Healthy-looking granulation with some degree of epithelialization. We are still waiting for response to our request for trial to try layer Oasis. Her appointment with vascular to review venous and arterial issues isn't sold the end of July 7/31. Not allow debridement or even vigorous cleansing of the wound surface. 12/18/17; slightly smaller especially on the right. 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 We've been using Hydrofera Blue. We still are looking into triple layer Oasis through her insurance 01/08/18 on evaluation today patient's wound actually appears to be showing signs of good improvement at this point in time. She has been tolerating the dressing changes without complication. Fortunately there does not appear to be any evidence of infection at this point in time. We have been utilizing silver nitrate  which does seem to be of benefit for her which is also good news. Overall I'm very happy with how things seem to be both regards appearance as well as measurement. Patient did see Dr. Bridgett Larsson for evaluation on 12/30/17. In his assessment he felt that stripping would not likely add much more than chronic compression to the patient's healing process. His recommendation was to follow-up in three months with Dr. Doren Custard if she hasn't healed in order to consider referral back to you and see vascular where she previously was in a trial and was able to get her wound to heal. I'll be see what she feels she when you staying compression and he reiterated this as well. 01/13/18 on evaluation today patient appears to actually be doing very well in regard to her bilateral medial malleolus ulcers. She seems to have tolerated the chemical cauterization with silver nitrate last week she did have some pain through that evening but fortunately states that I'll be see since it seems to be doing better she is overall pleased with the progress. 01/21/18; really quite a remarkable improvement since I've last seen these wounds. We started using silver nitrate specially on the islands of hyper granulation which for some reason her around the wound circumference. This is really done quite nicely. Primary dressing Hydrofera Blue under 4 layer compression. She seems to be able to hold out without a nurse rewrap. Follow-up in 1 week  01/28/18; we've continued the hydrofera blue but continued with chemical cauterization to the wound area that we started about a month ago for irregular hyper granulation. She is made almost stunning improvement in the overall wound dimensions. I was not really expecting this degree of improvement in these chronic wounds 02/05/18; we continue with Hydrofera Bluebut of also continued the aggressive chemical cauterization with silver nitrate. We made nice progress with the right greater than left  wound. 02/12/18. We continued with Hydrofera Blue after aggressive chemical cauterization with silver nitrate. We appear to be making nice progress with both wound areas 02/19/2018; we continue with [facility] after washing the wounds vigorously with Anasept spray and chemical cauterization with silver nitrate. We are making excellent progress. The area on the right's just about closed 02/26/2018. The area on the left medial ankle had too much necrotic debris today. I used a #5 curette we are able to get most of the soft. I continued with the silver nitrate to the much smaller wound on the right medial ankle she had a new area on her right lower pretibial area which she says was due to a role in her compression 03/05/2018; both wound areas look healthy. Not much change in dimensions from last week. I continue to use silver nitrate and Hydrofera Blue. The patient saw Dr. Doren Custard of vein and vascular. He felt she had venous stasis ulcers. He felt based on her previous arteriogram she should have adequate circulation for healing. Also she has deep venous reflux but really no significant correctable superficial venous reflux at this time. He felt we should continue with conservative management including leg elevation and compression 04/02/2018; since we last saw this woman about a month ago she had a fall apparently suffered a pelvic fracture. I did not look up the x-ray. Nevertheless because of pain she literally was bedbound for 2 weeks and had home health coming out to change the dressing. Somewhat predictably this is resulted in considerable improvement in both wound areas. The right is just about closed on the medial malleolus and the left is about half the size. 04/16/2018; both her wounds continue to go down in size. Using Hydrofera Blue. 05/07/18; both her wounds appeared to be improving especially on the right where it is almost closed. We are using Hydrofera Blue 05/14/2018; slightly worse this  week with larger wounds. Surface on the left medial not quite as good. We have been using Hydrofera Blue 05/21/18; again the wounds are slightly larger. Left medial malleolus slightly larger with eschar around the circumference. We have been using Hydrofera Blue undergoing a wraps for a prolonged period of time. This got a lot better when she was more recumbent due to a fall and a back injury. I change the primary dressing the silver alginate today. She did not tolerate a 4 layer compression previously although I may need to bring this up with her next time 05/28/2018; area on the left medial malleolus again is slightly larger with more drainage. Area on the right is roughly unchanged. She has a small area of folliculitis on the right medial just on the lower calf. This does not look ominous. 06/03/2018 left medial malleolus slightly smaller in a better looking surface. We used silver nitrate on this last time with silver alginate. The area on the right appears slightly smaller 1/10; left medial malleolus slightly smaller. Small open area on the right. We used silver nitrate and silver alginate as of 2 weeks ago. We continue with  the wound and compression. These got a lot better when she was off her feet 1/17; right medial malleolus wound is smaller. The left may be slightly smaller. Both surfaces look somewhat better. 1/24; both wounds are slightly smaller. Using silver alginate under Unna boots 1/31; both wounds appear smaller in fact the area on the right medial is just about closed. Surface eschar. We have been using silver alginate under Unna boots. The patient is less active now spends let much less time on her feet and I think this is contributed to the general improvement in the wound condition 2/7; both wounds appear smaller. I was hopeful the right medial would be closed however there there is still the same small open area. Slight amount of surface eschar on the left the dimensions are  smaller there is eschar but the wound edges appear to be free. We have been using silver alginate under Unna boot's 2/14; both wounds once again measure smaller. Circumferential eschar on the left medial. We have been using silver alginate under Unna boots with gradual improvement 2/21; the area on the right medial malleolus has healed. The area on the left is smaller. We have been using silver alginate and Unna boots. We can discharge wrapping the right leg she has 20/30 stockings at home she will need to protect the scar tissue in this area 2/28; the area on the right medial malleolus remains closed the patient has a compression stocking. The area on the left is smaller. We have been using silver alginate and Unna boots. 3/6 the area on the right medial ankle remains closed. Good edema control noted she is using her own compression stocking. The area on the left medial ankle is smaller. We have been managing this with silver alginate and Unna boots which we will continue today. 3/13; the area on the right medial ankle remains closed and I'm declaring it healed today. When necessary the left is about the same still a healthy-looking surface but no major change and wound area. No evidence of infection and using silver alginate under unna and generally making considerable improvement 3/27 the area on the right medial ankle remains closed the area on the left is about the same as last week. Certainly not any worse we have been using silver alginate under an Unna boot 4/3; the area on the right medial ankle remains closed per the patient. We did not look at this wound. The wound on the left medial ankle is about the same surface looks healthy we have been using silver alginate under an Unna boot 4/10; area on the right medial ankle remains closed per the patient. We did not look at this wound. The wound on the left medial ankle is slightly larger. The patient complains that the [facility] caused  burning pain all week. She also told us that she was a lot more active this week. Changed her back to silver alginate 4/17; right medial ankle still closed per the patient. Left medial ankle is slightly larger. Using silver alginate. She did not tolerate Hydrofera Blue on this area 4/24; right medial ankle remains closed we have not look at this. The left medial ankle continues to get larger today by about a centimeter. We have been using silver alginate under Unna boots. She complains about 4 layer compression as an alternative. She has been up on her feet working on her garden 5/8; right medial ankle remains closed we did not look at this. The left medial ankle has  increased in size about 100%. We have been using silver alginate under Unna boots. She noted increased pain this week and was not surprised that the wound is deteriorated 5/15; no major change in SA however much less erythema ( one week of doxy ocellulitis). 5/22-65 year old female returns at 1 week to the clinic for left medial ankle wound for which we have been using silver alginate under 3 layer compression She was placed on DOXY at last visit - the wound is wider at this visit. She is in 3 layer compression 5/29; change to [facility] last week. I had given her empiric doxycycline 2 weeks ago for a week. She is in 3 layer compression. She complains of a lot of pain and drainage on presentation today. 6/5; using Hydrofera Blue. I gave her doxycycline recently empirically for erythema and pain around the wound. Believe her cultures showed enterococcus which not would not have been well covered by doxycycline nevertheless the wound looks better and I don't feel specifically that the enterococcus needs to be covered. She has a new what looks like a wrap injury on her lateral left ankle. 6/12; she is using Hydrofera Blue. She has a new area on the left anterior lower tibial area. This was a wrap injury last week. 6/19; the patient is  using Hydrofera Blue. She arrived with marked inflammation and erythema around the wound and tenderness. 12/01/18 on evaluation today patient appears to be doing a little bit better based on what I'm hearing from the standpoint of lassos evaluation to this as far as the overall appearance of the wound is concerned. Then sometime substandard she typically sees Dr. Dellia Nims. Nonetheless overall very pleased with the progress that she's made up to this point. No fevers, chills, nausea, or vomiting noted at this time. 7/10; some improvement in the surface area. Aggressively debrided last week apparently. I went ahead with the debridement today although the patient does not tolerate this very well. We have been using Iodoflex. Still a fair amount of drainage 7/17; slightly smaller. Using Iodoflex. 7/24; no change from last week in terms of surface area. We have been using Iodoflex. Surface looks and continues to look somewhat better 7/31; surface area slightly smaller better looking surface. We have been using Iodoflex. This is under Unna boot compression 8/7-Patient presents at 1 week with Unna boot and Iodoflex, wound appears better 8/14-Patient presents at 1 week with Iodoflex, we use the Unna boot, wound appears to be stable better.Patient is getting Botox treatment for the inversion of the foot for tendon release, Next week 8/21; we are using Iodoflex. Unna boot. The wound is stable in terms of surface area. Under illumination there is some areas of the wound that appear to be either epithelialized or perhaps this is adherent slough at this point I was not really clear. It did not wipe off and I was reluctant to debride this today. 8/28; we are using Iodoflex in an Unna boot. Seems to be making good improvement. 9/4; using Iodoflex and wound is slightly smaller. 9/18; we are using Iodoflex with topical silver nitrate when she is here. The wound continues to be smaller 10/2; patient missed her  appointment last week due to GI issues. She left and Iodoflex based dressing on for 2 weeks. Wound is about the same size about the size of a dime on the left medial lower 10/9 we have been using Iodoflex on the medial left ankle wound. She has a new superficial probable wrap injury  on the dorsal left ankle 10/16; we have been using Hydrofera Blue since last week. This is on the left medial ankle 10/23; we have been using Hydrofera Blue since 2 weeks ago. This is on the left medial ankle. Dimensions are better 11/6; using Hydrofera Blue. I think the wound is smaller but still not closed. Left medial ankle 11/13; we have been using Hydrofera Blue. Wound is certainly no smaller this week. Also the surface not as good. This is the remanent of a very large area on her left medial ankle. 11/20; using Sorbact since last week. Wound was about the same in terms of size although I was disappointed about the surface debris 12/11; 3-week follow-up. Patient was on vacation. Wound is measuring slightly larger we have been using Sorbact. 12/18; wound is about the same size however surface looks better last week after debridement. We have been using Sorbact under compression 1/15 wound is probably twice the size of last time increased in length nonviable surface. We have been using Sorbact. She was running a mild fever and missed her appointment last week 1/22; the wound is come down in size but under illumination still a very adherent debris we have been Hydrofera Blue that I changed her to last week 1/29; dimensions down slightly. We have been using Hydrofera Blue 2/19 dimensions are the same however there is rims of epithelialization under illumination. Therefore more the surface area may be epithelialized 2/26; the patient's wound actually measures smaller. The wound looks healthy. We have been using Hydrofera Blue. I had some thoughts about running Apligraf then I still may do that however this looks so much  better this week we will delay that for now 3/5; the wound is small but about the same as last week. We have been using Hydrofera Blue. No debridement is required today. 3/19; the wound is about the size of a dime. Healthy looking wound even under illumination. We have been using Hydrofera Blue. No mechanical debridement is necessary 3/26; not much change from last week although still looks very healthy. We have been using Hydrofera Blue under Unna boots Patient was offered an ankle fusion by podiatry but not until the wound heals with a proceed with this. 4/9; the patient comes in today with her original wound on the medial ankle looking satisfactory however she has some uncontrolled swelling in the middle part of her leg with 2 new open areas superiorly just lateral to the tibia. I think this was probably a wrap issue. She said she felt uncomfortable during the week but did not call in. We have been using Hydrofera Blue 4/16; the wound on the medial ankle is about the same. She has innumerable small areas superior to this across her mid tibia. I think this is probably folliculitis. She is also been working in the yard doing a lot of sweating 4/30; the patient issue on the upper areas across her mid tibia of all healed. I think this was excessive yard work if I remember. Her wound on the medial ankle is smaller. Some debris on this we have been using Hydrofera Blue under Unna boots 5/7; mid tibia. She has been using Hydrofera Blue under an Unna wrap. She is apparently going for her ankle surgery on June 3 10/28/19-Patient returns to clinic with the ankle wound, we are using Hydrofera Blue under Unna wrap, surgery is scheduled for her left foot for June 3 so she will be back for nurse visit next week READMISSION 01/17/2020 Mrs.  [name] is a 65 year old woman we have had in this clinic for a long period of time with severe venous hypertension and refractory wounds on her medial lower legs and ankles  bilaterally. 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 When she was off her legs for a prolonged period example when she fell and suffered a compression fracture things would heal up quite nicely. She is now retired and we managed to heal up the right medial leg wound. The left one was very tiny last time I saw this although still refractory. 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 It got to the point where this was interfering with her walking and she elected to proceed with a ankle arthrodesis to straighten her her ankle and leave her with a functional outcome for mobilization. The patient was referred to Dr. Doren Custard and really this took some time to arrange. Dr. Doren Custard saw her on 12/07/2019. Once again he verified that she had no arterial issues. She had previously had an angiogram several years ago. Follow-up ABIs on the left showed an ABI of 1.12 with triphasic waveforms and a TBI of 0.92. She is felt to have chronic deep venous insufficiency but I do not think it was felt that anything could be done from about this from an ablation point of view. At the time Dr. Doren Custard saw this patient the wounds actually look closed via the pictures in his clinic. The patient finally underwent her surgery on 12/15/2019. This went reasonably well and there was a good anatomic outcome. She developed a small distal wound dehiscence on the lateral part of the surgical wound. However more problematically she is developed recurrence of the wound on the medial left ankle. There are actually 2 wounds here one in the distal lower leg and 1 pretty much at the level of the medial malleolus. It is a more distal area that is more problematic. She has been using Hydrofera Blue which started on Friday before this she was  simply Ace wrapping. There was a culture done that showed Pseudomonas and she is on ciprofloxacin. A recent CNS on 8/11 was negative. The patient reports some pain but I generally think this is improving. She is using a cam boot completely nonweightbearing using a walker for pivot transfers and a wheelchair 8/24; not much improvement unfortunately she has a surgical wound on the lateral part in the venous insufficiency wound medially. The bottom part of the medial insufficiency wound is still necrotic there is exposed tendon here. We have been using Hydrofera Blue under compression. Her edema control is however better 8/31; patient in for follow-up of his surgical wound on the lateral part of her left leg and chronic venous insufficiency ulcers medially. We put her back in compression last week. She comes in today with a complaint of 3 or 4 days worth of increasing pain. She felt her cam walker was rubbing on the area on the back of her heel. However there is intense erythema seems more likely she has cellulitis. She had 2 cultures done when she was seeing podiatry in the postop. One of them in late July showed Pseudomonas and she received a course of ciprofloxacin the other was negative on 8/11 she is allergic to penicillin with anaphylactoid complaints of hives oral  swelling via information in epic 9/9; when I saw this patient last week she had intense anterior erythema around her wound on the right lateral heel and ankle and also into the right medial heel. Some of this was no doubt drainage and her walker boot however I was convinced she had cellulitis. I gave her Levaquin and Bactrim she is finishing up on this now. She is following up with Dr. [name] he saw her yesterday. He is taken her out of the walking boot of course she is still nonweightbearing. Her x-ray was negative for any worrisome features such as soft tissue air etc. Things are a lot better this week. She has home health. We have been  using Hydrofera Blue under an [employer] which she put back on yesterday. I did not wrap her last week 9/17; her surrounding skin looks a lot better. In fact the area on the left lateral ankle has just a scant amount of eschar. The only remaining wound is the large area on the left medial ankle. Probably about 60% of this is healthy granulation at the surface however she has a significant divot distally. This has adherent debris in it. I been using debridement and silver collagen to try and get this area to fill-in although I do not think we have made much progress this week 9/24; the patient's wound on the left medial ankle looks a lot better. The deeper divot area distally still requires debridement but this is cleaning up quite nicely we have been using silver collagen. The patient is complaining of swelling in her foot and is worried that that is contributing to the nonhealing of the ankle wound. She is also complaining of numbness in her anterior toes 10/4; left medial ankle. The small area distally still has a divot with necrotic material that I have been debriding away. This has an undermining area. She is approved for Apligraf. She saw Dr. [name] her surgeon on 10/1. I think he declared himself is satisfied with the condition of things. Still nonweightbearing till the end of the month. 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 Her surgical wound is well closed. There is no evidence of infection 10/11; the patient arrived in clinic today with the expectation that we be able to put an Apligraf on this area after debridement however she arrives with a relatively large amount of green drainage on the dressing. The patient states that this started on Friday. She has not been systemically unwell. 10/19; culture I did last week showed both Enterococcus and Pseudomonas. I think this came in separate parts because I stopped her ciprofloxacin I gave her and prescribed her  linezolid however now looking at the final culture result this is Pseudomonas which is resistant to quinolones. She has not yet picked up the linezolid apparently phone issues. We are also trying to get a topical antibiotic out of [place] in [place] they can be applied by home health. She is still having green drainage 10/16; the patient has her topical antibiotic from [facility] in [place]. This is a compounded gel with vancomycin and ciprofloxacin and gentamicin. We are applying this on the wound bed with silver alginate over the top with Unna boot wraps. She arrives in clinic today with a lot less ominous looking drainage although she is only use this topical preparation once the second time today. She sees Dr. [name] her surgeon on Friday she has home health changing the dressing 11/2; still using  her compounded topical antibiotic under silver alginate. Surface is cleaning up there is less drainage. We had an Apligraf for her today and I elected to apply it. A light coating of her antibiotic 04/25/2020 upon evaluation today patient appears to be doing well with regard to her ankle ulcer. There is a little bit of slough noted on the surface of the wound I am can have to perform sharp debridement to clear this away today. With that being said other than that fact overall I feel like she is making progress and we do see some new epithelial growth. There is also some improvement in the depth of the wound and that distal portion. There is little bit of slough there as well. 12/7; 2-week follow-up. Apligraf #3. Dimensions are smaller. Closing in especially inferiorly. Still some surface debris. Still using the [facility] topical antibiotic but I told her that I don't think this needs to be renewed 12/21; 2-week follow-up. Apligraf #4 dimensions are smaller. Nice improvement 06/05/2020; 2-week follow-up. The patient's wound on the left medial ankle looks really excellent. Nice granulation.  Advancing epithelialization no undermining no evidence of infection. We would have to reapply for another Apligraf but with the condition of this wound I did not feel strongly about it. We used Hydrofera Blue under the same degree of compression. She follows up with Dr. [name] her surgeon a week Friday 06/13/2020 upon evaluation today patient appears to be doing excellent in regard to her wound. She has been tolerating the dressing changes without complication. Fortunately there is no signs of active infection at this time. No fevers, chills, nausea, vomiting, or diarrhea. She was using Hydrofera Blue last week. 06/20/2020 06/20/2020 on evaluation today patient actually appears to be doing excellent in regard to her wound. This is measuring better and looking much better as well. She has been using the collagen that seems to be doing better for her as well even though the [facility] was and is not sticking or feeling as rough on her wound. She did see Dr. [name] on Friday he is very pleased he also stated none of the hardware has shifted. That is great news 1/27; the patient has a small clean wound all that is remaining. I agree that this is too small to really consider an Apligraf. Under illumination the surface is looking quite good. We have been using collagen although the dimensions are not any better this week 2/2; the patient has a small clean wound on the left medial ankle. Although this left of her substantial original areas. Measurements are smaller. We have been using polymen Ag under an Haematologist. 2/10; small area on the left medial ankle. This looks clean nothing to debride however dimensions are about the same we have been using polymen I think now for 2 weeks 2/17; not much change in surface area. We have been using polymen Ag without any improvement. 3/17; 1 month follow-up. The patient has been using endoform without any improvement in fact I think this looks worse with more depth and  more expansion 3/24; no improvement. Perhaps less debris on the surface. We have been using Sorbact for 1 week 4/4; wound measures larger. She has edema in her leg and her foot which she tells as her wrap came down. We have been using Unna boots. Sorbact of the wound. She has been approved for Apligraf 09/12/2020 upon evaluation today patient appears to be doing well with regard to her wound. We did get the Apligraf reapproved  which is great news we have that available for application today. Fortunately there is no signs of infection and overall the patient seems to be doing great. The wound bed is nice and clean. 4/27; patient presents for her second application of Apligraf. She states over the past week she has been on her feet more often due to being outside in her garden. She has noted more swelling to her foot as a result. She denies increased warmth, pain or erythema to the wound site. 10/10/2020 upon evaluation today patient appears to be doing well with regard to her wound which does not appear to be quite as irritated as last week from what I am hearing. With that being said unfortunately she is having issues with some erythema and warmth to touch as well as an increase size. I do believe this likely is infected. Electronic Signature(s) Signed: 10/10/2020 11:40:26 AM By: [name] Keeler PA-C Entered By: [name] on 10/10/2020 11:40:26 -------------------------------------------------------------------------------- Physical Exam Details Patient Name: Date of Service: [facility] [name], ELEA NO R [name]. 10/10/2020 10:00 A M Medical Record Number: [id number] Patient Account Number: [id number] Date of Birth/Sex: Treating RN: Apr 14, 1956 (65 y.o. [name] Primary Care Provider: Lennie [name] Other Clinician: Referring Provider: Treating Provider/Extender: [name] in Treatment: 56 Constitutional Well-nourished and well-hydrated in no acute  distress. Respiratory normal breathing without difficulty. Psychiatric this patient is able to make decisions and demonstrates good insight into disease process. Alert and Oriented x 3. pleasant and cooperative. Notes Upon inspection patient's wound bed actually showed signs of good granulation epithelization at this point. There does not appear to be any significant slough buildup although there is erythema and warmth noted I do believe this is representation of cellulitis she is also had increased pain which again I thinks corresponds with this. Subsequently I believe initiation of antibiotic would be appropriate currently. Electronic Signature(s) Signed: 10/10/2020 11:40:49 AM By: [name] Keeler PA-C Entered By: [name] on 10/10/2020 11:40:48 -------------------------------------------------------------------------------- Physician Orders Details Patient Name: Date of Service: [name]. 10/10/2020 10:00 A M Medical Record Number: [id number] Patient Account Number: [id number] Date of Birth/Sex: Treating RN: 1955-09-15 (65 y.o. [name] Primary Care Provider: Lennie [name] Other Clinician: Referring Provider: Treating Provider/Extender: [name] in Treatment: 21 Verbal / Phone Orders: No Diagnosis Coding ICD-10 Coding Code Description [phone] Chronic venous hypertension (idiopathic) with ulcer and inflammation of left lower extremity L97.828 Non-pressure chronic ulcer of other part of left lower leg with other specified severity L97.328 Non-pressure chronic ulcer of left ankle with other specified severity Follow-up Appointments ppointment in 1 week. - plan apligraf next week Return A Bathing/ Shower/ Hygiene May shower with protection but do not get wound dressing(s) wet. Edema Control - Lymphedema / SCD / Other Elevate legs to the level of the heart or above for 30 minutes daily and/or when sitting, a frequency of: -  throughout the day Avoid standing for long periods of time. Exercise regularly Compression stocking or Garment 20-30 mm/Hg pressure to: - right leg daily Wound Treatment Wound #15 - Malleolus Wound Laterality: Left, Medial Cleanser: Soap and Water 1 x Per Week Discharge Instructions: May shower and wash wound with dial antibacterial soap and water prior to dressing change. Peri-Wound Care: Sween Lotion (Moisturizing lotion) 1 x Per Week Discharge Instructions: Apply moisturizing lotion as directed Prim Dressing: KerraCel Ag Gelling Fiber Dressing, 2x2 in (silver alginate) 1 x Per Week  ary Discharge Instructions: Apply silver alginate to wound bed as instructed Secondary Dressing: Woven Gauze Sponge, Non-Sterile 4x4 in 1 x Per Week Discharge Instructions: Apply over primary dressing as directed. Secondary Dressing: ABD Pad, 5x9 1 x Per Week Discharge Instructions: Apply over primary dressing as directed. Compression Wrap: Unnaboot w/Calamine, 4x10 (in/yd) 1 x Per Week Discharge Instructions: Apply Unnaboot as directed. Laboratory naerobe culture (MICRO) - left medial ankle Bacteria identified in Unspecified specimen by A LOINC Code: [id number] Convenience Name: Anerobic culture Patient Medications llergies: penicillin, doxycycline A Notifications Medication Indication Start End 10/10/2020 Cipro DOSE 1 - oral 500 mg tablet - 1 tablet oral taken 2 times per day for 14 days Electronic Signature(s) Signed: 10/10/2020 11:42:23 AM By: [name] Keeler PA-C Entered By: [name] on 10/10/2020 11:42:22 -------------------------------------------------------------------------------- Problem List Details Patient Name: Date of Service: [name]. 10/10/2020 10:00 A M Medical Record Number: [id number] Patient Account Number: [id number] Date of Birth/Sex: Treating RN: [birth year] (65 y.o. [name] Primary Care Provider: Lennie [name] Other Clinician: Referring  Provider: Treating Provider/Extender: [name] in Treatment: 47 Active Problems ICD-10 Encounter Code Description Active Date MDM Diagnosis I87.332 Chronic venous hypertension (idiopathic) with ulcer and inflammation of left 01/17/2020 No Yes lower extremity L97.828 Non-pressure chronic ulcer of other part of left lower leg with other specified 01/17/2020 No Yes severity L97.328 Non-pressure chronic ulcer of left ankle with other specified severity 01/17/2020 No Yes Inactive Problems ICD-10 Code Description Active Date Inactive Date L03.116 Cellulitis of left lower limb 01/31/2020 01/31/2020 T81.31XD Disruption of external operation (surgical) wound, not elsewhere classified, subsequent 01/17/2020 01/17/2020 encounter Resolved Problems Electronic Signature(s) Signed: 10/10/2020 11:20:28 AM By: [name] Keeler PA-C Entered By: [name] on 10/10/2020 11:20:28 -------------------------------------------------------------------------------- Progress Note Details Patient Name: Date of Service: [name]. 10/10/2020 10:00 A M Medical Record Number: [id number] Patient Account Number: [id number] Date of Birth/Sex: Treating RN: [id number] (65 y.o. [name] Primary Care Provider: Lennie [name] Other Clinician: Referring Provider: Treating Provider/Extender: [name] in Treatment: 61 Subjective Chief Complaint Information obtained from Patient patient is been followed long-term in this clinic for venous insufficiency ulcers with inflammation, hypertension and ulceration over the medial ankle bilaterally. 01/17/2020; this is a patient who is here for review of postoperative wounds on the left lateral ankle and recurrence of venous stasis ulceration on the left medial History of Present Illness (HPI) the remaining wound is over the left medial ankle. Similar wound over the right medial ankle healed largely with use  of Apligraf. Most recently we have been using Hydrofera Blue over this wound with considerable improvement. The patient has been extensively worked up in the past for her venous insufficiency and she is not a candidate for antireflux surgery although I have none of the details available currently. 08/24/14; considerable improvement today. About 50% of this wound areas now epithelialized. The base of the wound appears to be healthier granulation.as opposed to last week when she had deteriorated a considerable improvement 08/17/14; unfortunately the wound has regressed somewhat. The areas of epithelialization from the superior aspect are not nearly as healthy as they were last week. The patient thinks her Hydrofera Blue slipped. 09/07/14; unfortunately the area has markedly regressed in the 2 weeks since I've seen this. There is an [name] surrounding erythema. The healthy granulation tissue that we had at the base of the wound now is a dusky color. The  nurse reports green drainage 09/14/14; the area looks somewhat better than last week. There is less erythema and less drainage. The culture I did did not show any growth. Nevertheless I think it is better to continue the Cipro and doxycycline for a further week. The remaining wound area was debridement. 09/21/14. Wound did not require debridement last week. Still less erythema and less drainage. She can complete her antibiotics. The areas of epithelialization in the superior aspect of the wound do not look as healthy as they did some weeks ago 10/05/14 continued improvement in the condition of this wound. There is advancing epithelialization. Less aggressive debridement required 10/19/14 continued improvement in the condition and volume of this wound. Less aggressive debridement to the inferior part of this to remove surface slough and fibrinous eschar 11/02/14 no debridement is required. The surface granulation appears healthy although some of her islands of  epithelialization seem to have regressed. No evidence of infection 11/16/14; lites surface debridement done of surface eschar. The wound does not look to be unhealthy. No evidence of infection. Unfortunately the patient has had podiatry issues in the right foot and for some reason has redeveloped small surface ulcerations in the medial right ankle. Her original presentation involved wounds in this area 11/23/14 no debridement. The area on the right ankle has enlarged. The left ankle wound appears stable in terms of the surface although there is periwound inflammation. There has been regression in the amount of new skin 11/30/14 no debridement. Both wound areas appear healthy. There was no evidence of infection. The the new area on the right medial ankle has enlarged although that both the surfaces appear to be stable. 12/07/14; Debridement of the right medial ankle wound. No no debridement was done on the left. 12/14/14 no major change in and now bilateral medial ankle wounds. Both of these are very painful but the no overt evidence of infection. She has had previous venous ablation 12/21/14; patient states that her right medial ankle wound is considerably more painful last week than usual. Her left is also somewhat painful. She could not tolerate debridement. The right medial ankle wound has fibrinous surface eschar 12/28/14 this is a patient with severe bilateral venous insufficiency ulcers. For a considerable period of time we actually had the one on the right medial ankle healed however this recently opened up again in June. The left medial ankle wound has been a refractory area with some absent flows. We had some success with Hydrofera Blue on this area and it literally closed by 50% however it is recently opened up Foley. Both of these were debridement today of surface eschar. She tolerates this poorly 01/25/15: No change in the status of this. Thick adherent escar. Very poor tolerance of any attempt  at debridement. I had healed the right medial malleolus wound for a considerable amount of time and had the left one down to about 50% of the volume although this is totally regressed over the last 48 weeks. Further the right leg has reopened. she is trying to make a appointment with pain and vascular, previous ablations with Dr. Aleda [name]. I do not believe there is an arterial insufficiency issue here 02/01/15 the status of the adherent eschar bilaterally is actually improved. No debridement was done. She did not manage to get vascular studies done 02/08/15 continued debridement of the area was done today. The slough is less adherent and comes off with less pressure. There is no surrounding infection peripheral pulses are intact 02/15/15 selective debridement  with a disposable curette. Again the slough is less adherent and comes off with less difficulty. No surrounding infection peripheral pulses are intact. 02/22/15 selective debridement of the right medial ankle wound. Slough comes off with less difficulty. No obvious surrounding infection peripheral pulses are intact I did not debridement the one on the left. Both of these are stable to improved 03/01/15 selective debridement of both wound areas using a curette to. Adherent slough cup soft with less difficulty. No obvious surrounding infection. The patient tells me that 2 days ago she noted a rash above the right leg wrap. She did not have this on her lower legs when she change this over she arrives with widespread left greater than right almost folliculitis-looking rash which is extremely pruritic. I don't see anything to culture here. There is no rash on the rest of her body. She feels well systemically. 03/08/15; selective debridement of both wounds using a curette. Base of this does not look unhealthy. She had limegreen drainage coming out of the left leg wound and describes a lot of drainage. The rash on her left leg looks improved to. No  cultures were done. 03/22/15; patient was not here last week. Basal wounds does not look healthy and there is no surrounding erythema. No drainage. There is still a rash on the left leg that almost looks vasculitic however it is clearly limited to the top of where the wrap would be. 04/05/15; on the right required a surgical debridement of surface eschar and necrotic subcutaneous tissue. I did not debridement the area on the left. These continue to be large open wounds that are not changing that much. We were successful at one point in healing the area on the right, and at the same time the area on the left was roughly half the size of current measurements. I think a lot of the deterioration has to do with the prolonged time the patient is on her feet at work 04/19/15 I attempted-like surface debridement bilaterally she does not tolerate this. She tells me that she was in allergic care yesterday with extreme pain over her left lateral malleolus/ankle and was told that she has an "sprain" 05/03/15; large bilateral venous insufficiency wounds over the medial malleolus/medial aspect of her ankles. She complains of copious amounts of drainage and his usual large amounts of pain. There is some increasing erythema around the wound on the right extending into the medial aspect of her foot to. historically she came in with these wounds the right one healed and the left one came down to roughly half its current size however the right one is reopened and the left is expanded. 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 05/10/15 large bilateral venous insufficiency wounds. There is less adherence surface left however the surface culture that I did last week grew pseudomonas therefore bilateral selective debridement score necessary. There is surrounding erythema. The patient describes severe bilateral drainage and a lot of pain in the left ankle. Apparently her  podiatrist was were ready to do a cortisone shot 05/17/15; the patient complains of pain and again copious amounts of drainage. 05/24/15; we used Iodo flex last week. Patient notes considerable improvement in wound drainage. Only needed to change this once. 05/31/15; we continued Iodoflex; the base of these large wounds bilaterally is not too bad but there is probably likely a significant bioburden here. I would like to debridement just doesn't tolerate  it. 06/06/14 I would like to continue the Iodoflex although she still hasn't managed to obtain supplies. She has bilateral medial malleoli or large wounds which are mostly superficial. Both of them are covered circumferentially with some nonviable fibrinous slough although she tolerates debridement very poorly. She apparently has an appointment for an ablation on the right leg by interventional radiology. 06/14/15; the patient arrives with the wounds and static condition. We attempted a debridement although she does not do well with this secondary to pain. I 07/05/15; wounds are not much smaller however there appears to be a cleaner granulating base. The left has tight fibrinous slough greater than the right. Debridement is tolerated poorly due to pain. Iodoflex is done more for these wounds in any of the multitude of different dressings I have tried on the left 1 and then subsequently the right. 07/12/15; no change in the condition of this wound. I am able to do an aggressive debridement on the right but not the left. She simply cannot tolerate it. We have been using Iodoflex which helps somewhat. It is worthwhile remembering that at one point we healed the right medial ankle wound and the left was about 25% of the current circumference. We have suggested returning to vascular surgery for review of possible further ablations for one reason or another she has not been able to do this. 07/26/15 no major change in the condition of either wound on her medial  ankle. I did not attempt to debridement of these. She has been aggressively scrubbing these while she is in the shower at home. She has her supply of Iodoflex which seems to have done more for these wounds then anything I have put on recently. 08/09/15 wound area appears larger although not verified by measurements. Using Iodoflex 09/05/2015 -- she was here for avisit today but had significant problems with the wound and I was asked to see her for a physician opinion. I have summarize that this lady has had surgery on her left lower extremity about 10 years ago where the possible veins stripping was done. She has had an opinion from interventional radiology around November 2016 where no further sclerotherapy was ordered. The patient works 12 hours a day and stands on a concrete floor with work boots and is unable to get the proper compression she requires and cannot elevate her limbs appropriately at any given time. She has recently grown Pseudomonas from her wound culture but has not started her ciprofloxacin which was called in for her. 09/13/15 this continues to be a difficult situation for this patient. At one point I had this wound down to a 1.5 x 1.5" wound on her left leg. This is deteriorated and the right leg has reopened. She now has substantial wounds on her medial calcaneus, malleoli and into her lower leg. One on the left has surface eschar but these are far too painful for me to debridement here. She has a vascular surgery appointment next week to see if anything can be done to help here. I think she has had previous ablations several years ago at [facility]. She has no major edema. She tells me that she did not get product last time [facility] and went for several days without it. She continues to work in work boots 12 hours a day. She cannot get compression/4-layer under her work boots. 09/20/15 no major change. Periwound edema control was not very good. Her point with pain and  vascular is next Wednesday the 25th 09/28/15;  the patient is seen vascular surgery and is apparently scheduled for repeat duplex ultrasounds of her bilateral lower legs next week. 10/05/15; the patient was seen by Dr. Doren Custard of vascular surgery. He feels that she should have arterial insufficiency excluded as cause/contributed to her nonhealing stage she is therefore booked for an arteriogram. She has apparently monophasic signals in the dorsalis pedis pulses. She also of course has known severe chronic venous insufficiency with previous procedures as noted previously. I had another long discussion with the patient today about her continuing to work 12 hour shifts. I've written her out for 2 months area had concerns about this as her work location is currently undergoing significant turmoil and this may lead to her termination. She is aware of this however I agree with her that she simply cannot continue to stand for 12 hours multiple days a week with the substantial wound areas she has. 10/19/15; the Dr. Doren Custard appointment was largely for an arteriogram which was normal. She does not have an arterial issue. He didn't make a comment about her chronic venous insufficiency for which she has had previous ablations. Presumably it was not felt that anything additional could be done. The patient is now out of work as I prescribed 2 weeks ago. Her wounds look somewhat less aggravated presumably because of this. I felt I would give debridement another try today 10/25/15; no major change in this patient's wounds. We are struggling to get her product that she can afford into her own home through her insurance. 11/01/15; no major change in the patient's wounds. I have been using silver alginate as the most affordable product. I spoke to Dr. Marla Roe last week with her requested take her to the OR for surgical debridement and placement of ACEL. Dr. Marla Roe told me that she would be willing to do this however [facility] will not cover this, fortunately the patient has [place] healthcare of some variant 11/08/15; no major change in the patient's wounds. She has been completely nonviable surface that this but is in too much pain with any attempted debridement are clinic. I have arranged for her to see Dr. Marla Roe ham of plastic surgery and this appointment is on Monday. I am hopeful that they will take her to the OR for debridement, possible ACEL ultimately possible skin graft 11/22/15 no major change in the patient's wounds over her bilateral medial calcaneus medial malleolus into the lower legs. Surface on these does not look too bad however on the left there is surrounding erythema and tenderness. This may be cellulitis or could him sleepy tinea. 11/29/15; no major changes in the patient's wounds over her bilateral medial malleolus. There is no infection here and I don't think any additional antibiotics are necessary. There is now plan to move forward. She sees Dr. Marla Roe in a week's time for preparation for operative debridement and ACEL placement I believe on 7/12. She then has a follow-up appointment with Dr. Marla Roe on 7/21 12/28/15; the patient returns today having been taken to the [facility] by Dr. Marla Roe 12/12/15 she underwent debridement, intraoperative cultures [which were negative]. She had placement of a wound VAC. Parent really ACEL was not available to be placed. The wound VAC foam apparently adhered to the wound since then she's been using silver alginate, Xeroform under Ace wraps. She still says there is a lot of drainage and a lot of pain 01/31/16; this is a patient I see monthly. I had referred her to Dr. Marla Roe him  of plastic surgery for large wounds on her bilateral medial ankles. She has been to the OR twice once in early July and once in early August. She tells me over the last 3 weeks she has been using the wound VAC with ACEL underneath it. On the right we've simply been  using silver alginate. Under Kerlix Coban wraps. 02/28/16; this is a patient I'm currently seeing monthly. She is gone on to have a skin graft over her large venous insufficiency ulcer on the left medial ankle. This was done by Dr. Marla Roe him. The patient is a bit perturbed about why she didn't have one on her right medial ankle wound. She has been using silver alginate to this. 03/06/16; I received a phone call from her plastic surgery Dr. Marla Roe. She expressed some concern about the viability of the skin graft she did on the left medial ankle wound. Asked me to place Endoform on this. She told me she is not planning to do a subsequent skin graft on the right as the left one did not take very well. I had placed Hydrofera Blue on the right 03/13/16; continue to have a reasonably healthy wound on the right medial ankle. Down to 3 mm in terms of size. There is epithelialization here. The area on the left medial ankle is her skin graft site. I suppose the last week this looks somewhat better. She has an open area inferiorly however in the center there appears to be some viable tissue. There is a lot of surface callus and eschar that will eventually need to come off however none of this looked to be infected. Patient states that the is able to keep the dressing on for several days which is an improvement. 03/20/16 no major change in the circumference of either wound however on the left side the patient was at Dr. Eusebio Friendly office and they did a debridement of left wound. 50% of the wound seems to be epithelialized. I been using Endoform on the left Hydrofera Blue in the right 03/27/16; she arrives today with her wound is not looking as healthy as they did last week. The area on the right clearly has an adherent surface to this a very similar surface on the left. Unfortunately for this patient this is all too familiar problem. Clearly the Endoform is not working and will need to change that  today that has some potential to help this surface. She does not tolerate debridement in this clinic very well. She is changing the dressing wants 04/03/16; patient arrives with the wounds looking somewhat better especially on the right. Dr. Migdalia Dk change the dressing to silver alginate when she saw her on Monday and also sold her some compression socks. The usefulness of the latter is really not clear and woman with severely draining wounds. 04/10/16; the patient is doing a bit of an experiment wearing the compression stockings that Dr. Migdalia Dk provided her to her left leg and the out of legs based dressings that we provided to the right. 05/01/16; the patient is continuing to wear compression stockings Dr. Migdalia Dk provided her on the left that are apparently silver impregnated. She has been using Iodoflex to the right leg wound. Still a moderate amount of drainage, when she leaves here the wraps only last for 4 days. She has to change the stocking on the left leg every night 05/15/16; she is now using compression stockings bilaterally provided by Dr. Marla Roe. She is wearing a nonadherent layer over the wounds so really  I don't think there is anything specific being done to this now. She has some reduction on the left wound. The right is stable. I think all healing here is being done without a specific dressing 06/09/16; patient arrives here today with not much change in the wound certainly in diameter to large circular wounds over the medial aspect of her ankle bilaterally. Under the light of these services are certainly not viable for healing. There is no evidence of surrounding infection. She is wearing compression stockings with some sort of silver impregnation as prescribed by Dr. Marla Roe. She has a follow-up with her tomorrow. 06/30/16; no major change in the size or condition of her wounds. These are still probably covered with a nonviable surface. She is using only her purchase stockings. She  did see Dr. Marla Roe who seemed to want to apply Dakin's solution to this I'm not extreme short what value this would be. I would suggest Iodoflex which she still has at home. 07/28/16; I follow Mrs. Toellner episodically along with Dr. Marla Roe. 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 She has been applying a topical collagen ointment to both wounds with Adaptic. I don't think Dr. Marla Roe is planning to take her back to the OR. 08/19/16; I follow Mrs. Jeneen Rinks on a monthly basis along with Dr. Marla Roe of plastic surgery. 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 I been following her for a number of years. At one point I was able to get the right medial malleolus wound to heal and had the left medial malleolus down to about half its current size however and I had to send her to plastic surgery for an operative debridement. Since then things have been stable to slightly improve the area on the right is slightly better one in the left about the same although there is much less adherent surface than I'm used to with this patient. She is using some form of liquid collagen gel that Dr. Marla Roe provided a Kerlix cover with the patient's own pressure stockings. She tells me that she has extreme pain in both ankles and along the lateral aspect of both feet. She has been unable to work for some period of time. She is telling me she is retiring at the beginning of April. She sees Dr. Doran Durand of orthopedics next week 09/22/16; patient has not seen Dr. Marla Roe since the last time she is here. I'm not really sure what she is using to the wounds other than bits and pieces of think she had left over including most recently Hydrofera Blue. She is using juxtalite stockings. She is having difficulty with her husband's recent illness "stroke". She is having to transport him to  various doctors appointments. Dr. Marla Roe left her the option of a repeat debridement with ACEL however she has not been able to get the time to follow-up on this. She continues to have a fair amount of drainage out of these wounds with certainly precludes leaving dressings on all week 10/13/16; patient has not seen Dr. Marla Roe since she was last in our clinic. I'm not really sure what she is doing with the wounds, we did try to get her [facility] and I think she is actually using this most of the time. Because of drainage she states she has to change this every second day although this is an improvement from what she used to do. She went  to see Dr. Doran Durand who did not think she had a muscular issue with regards to her feet, he referred her to a neurologist and I think the appointment is sometime in June. I changed her back to Iodoflex which she has used in the past but not recently. 11/03/16; the patient has been using Iodoflex although she ran out of this. Still claims that there is a lot of drainage although the wound does not look like this. No surrounding erythema. She has not been back to see Dr. Marla Roe 11/24/16; the patient has been using Iodoflex again but she ran out of it 2 or 3 days ago. There is no major change in the condition of either one of these wounds in fact they are larger and covered in a thick adherent surface slough/nonviable tissue especially on the left. She does not tolerate mechanical debridement in our clinic. Going back to see Dr. Marla Roe of plastic surgery for an operative debridement would seem reasonable. 12/15/16; the patient has not been back to see Dr. Marla Roe. She is been dealing with a series of illnesses and her husband which of monopolized her time. She is been using Sorbact which we largely supplied. She states the drainage is bad enough that it maximum she can go 2-3 days without changing the dressing 01/12/2017 -- the patient has not been back for  about 4 weeks and has not seen Dr. Marla Roe not does she have any appointment pending. 01/23/17; patient has not seen Dr. Marla Roe even though I suggested this previously. She is using Santyl that was suggested last week by Dr. Con Memos this Cost her $16 through her insurance which is indeed surprising 02/12/17; continuing Santyl and the patient is changing this daily. A lot of drainage. She has not been back to see plastic surgery she is using an Ace wrap. Our intake nurse suggested wrap around stockings which would make a good reasonable alternative 02/26/17; patient is been using Santyl and changing this daily due to drainage. She has not been to see plastic surgery she uses in April Ace wrap to control the edema. She did obtain extremitease stockings but stated that the edema in her leg was to big for these 03/20/17; patient is using Santyl and Anasept. Surfaces looked better today the area on the right is actually measuring a little smaller. She has states she has a lot of pain in her feet and ankles and is asking for a consult to pain control which I'll try to help her with through our case manager. 04/10/17; the patient arrives with better-looking wound surfaces and is slightly smaller wound on the left she is using a combination of Santyl and Anasept. She has an appointment or at least as started in the pain control center associated with [facility] 05/14/17; this is a patient who I followed for a prolonged period of time. 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 At one point I had this down to a much smaller wound on the left however these reopened and we've never been able to get these to heal. She has been using Santyl and Anasept gel although 2 weeks ago she ran out of the Anasept gel. She has a stable appearance of the wound. She is going to the wound care clinic at [facility]. They wanted do a nerve block/spinal block although she tells me she is  reluctant to go forward with that. 05/21/17; this is a patient I have followed for many years. 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 Chronic pain and deformity in her ankles as well. She is been to see plastic surgery as well as orthopedics. Using PolyMem AG most recently/Kerramax/ABDs and 2 layer compression. She has managed to keep this on and she is coming in for a nurse check to change the dressing on Tuesdays, we see her on Fridays 06/05/17; really quite a good looking surface and the area especially on the right medial has contracted in terms of dimensions. Well granulated healthy-looking tissue on both sides. Even with an open curet there is nothing that even feels abnormal here. This is as good as I've seen this in quite some time. We have been using PolyMem AG and bringing her in for a nurse check 06/12/17; really quite good surface on both of these wounds. The right medial has contracted a bit left is not. We've been using PolyMem and AG and she is coming in for a nurse visit 06/19/17; we have been using PolyMem AG and bringing her in for a nurse check. Dimensions of her wounds are not better but the surfaces looked better bilaterally. She complained of bleeding last night and the left wound and increasing pain bilaterally. She states her wound pain is more neuropathic than just the wounds. There was some suggestion that this was radicular from her pain management doctor in talking to her it is really difficult to sort this out. 06/26/17; using PolyMem and AG and bringing her in for a nurse check as All of this and reasonably stable condition. Certainly not improved. The dimensions on the lateral part of the right leg look better but not really measuring better. The medial aspect on the left is about the same. 07/03/16; we have been using PolyMen AG and bringing her in for a nurse check to change the dressings as the wounds have drainage which precludes once weekly  changing. We are using all secondary absorptive dressings.our intake nurse is brought up the idea of using a wound VAC/snap VAC on the wound to help with the drainage to see if this would result in some contraction. This is not a bad idea. The area on the right medial is actually looking smaller. Both wounds have a reasonable-looking surface. There is no evidence of cellulitis. The edema is well controlled 07/10/17; the patient was denied for a snap VAC by her insurance. The major issue with these wounds continues to be drainage. We are using wicked PolyMem AG and she is coming in for a nurse visit to change this. The wounds are stable to slightly improved. The surface looks vibrant and the area on the right certainly has shrunk in size but very slowly 07/17/17; the patient still has large wounds on her bilateral medial malleoli. Surface of both of these wounds looks better. The dimensions seem to come and go but no consistent improvement. There is no epithelialization. We do not have options for advanced treatment products due to insurance issues. They did not approve of the wound VAC to help control the drainage. More recently we've been using PolyMem and AG wicked to allow drainage through. We have been bringing her in for a nurse visit to change this. We do not have a lot of options for wound care products and the home again due to insurance issues 07/24/17; the patient's wound actually looks somewhat better today. No drainage measurements are smaller still healthy-looking surface. We used silver collagen under PolyMen started last week. We have been bringing her in for  a dressing change 07/31/17; patient's wound surface continued to look better and I think there is visible change in the dimensions of the wound on the right. Rims of epithelialization. We have been using silver collagen under PolyMen and bringing her in for a dressing change. There appears to be less drainage although she is still in need  of the dressing change 08/07/17. Patient's wound surface continues to look better on both sides and the area on the right is definitely smaller. We have been using silver collagen and PolyMen. She feels that the drainage has been it has been better. I asked her about her vascular status. She went to see Dr. Aleda [name] at [facility] and had some form of ablation. I don't have much detail on this. I haven't my notes from 2016 that she was not a candidate for any further ablation but I don't have any more information on this. We had referred her to vein and vascular I don't think she ever went. He does not have a history of PAD although I don't have any information on this either. We don't even have ABIs in our record 08/14/17; we've been using silver collagen and PolyMen cover. And putting the patient and compression. She we are bringing her in as a nurse visit to change this because ofarge amount of drainage. We didn't the ABIs in clinic today since they had been done in many moons 1.2 bilaterally. She has been to see vein and vascular however this was at [facility] and she had ablation although I really don't have any information on this all seemed biking get a report. She is also been operatively debrided by plastic surgery and had a cell placed probably 8-12 months ago. This didn't have a major effect. We've been making some gains with current dressings 08/19/17-She is here in follow-up evaluation for bilateral medial malleoli ulcers. She continues to tolerate debridement very poorly. We will continue with recently changed topical treatment; if no significant improvement may consider switching to Iodosorb/Iodoflex. 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 She has been using silver collagen and PolyMem. I believe she has been used and tried on Iodoflex before. During her trip to the clinic we've been watching her wound with Anasept spray and I  would like to encourage this on thenurse visit days 09/04/17 bilateral medial malleoli ulcers area is her chronic related to chronic venous insufficiency. These have been very refractory over time. We have been using silver collagen and PolyMen. She is coming in once a week for a doctor's and once a week for nurse visits. We are actually making some progress 09/18/17; the patient's wounds are smaller especially on the right medial. She arrives today to upset to consider even washing these off with Anasept which I think is been part of the reason this is been closing. We've been using collagen covered in PolyMen otherwise. It is noted that she has a small area of folliculitis on the right medial calf that. As we are wrapping her legs I'll give her a short course of doxycycline to make sure this doesn't amount to anything. She is a long list of complaints today including imbalance, shortness of breath on exertion, inversion of her left ankle. With regards to the latter complaints she is been to see orthopedics and they offered her a tendon release surgery I believe but wanted her wounds to be closed first. I have recommended she go see her primary physician with regards  to everything else. 09/25/17; patient's wounds are about the same size. We have made some progress bilaterally although not in recent weeks. She will not allow me T wash these wounds with Anasept even if she is doing her cell. Wheeze we've been using collagen covered in PolyMen. Last week she had a small area of folliculitis this is now opened into a small wound. She completed 5 days of trimethoprim sulfamethoxazole 10/02/17; unfortunately the area on her left medial ankle is worse with a larger wound area towards the Achilles. The patient complains of a lot of pain. She will not allow debridement although visually I don't think there is anything to debridement in any case. We have been using silver collagen and PolyMen for several months now.  Initially we are making some progress although I'm not really seeing that today. We will move back to [facility]. His admittedly this is a bit of a repeat however I'm hoping that his situation is different now. The patient tells me she had her leg on the left give out on her yesterday this is process some pain. 10/09/17; the patient is seen twice a week largely because of drainage issues coming out of the chronic medial bimalleolar wounds that are chronic. Last week the dimensions of the one on the left looks a little larger I changed her to [facility]. She comes in today with a history of terrible pain in the bilateral wound areas. She will not allow debridement. She will not even allow a tissue culture. There is no surrounding erythema no no evidence of cellulitis. We have been putting her Kerlix Coban man. She will not allow more aggressive compression as there was a suggestion to put her in 3 layer wraps. 10/16/17; large wounds on her bilateral medial malleoli. These are chronic. Not much change from last week. The surface looks have healthy but absolutely no epithelialization. A lot of pain little less so of drainage. She will not allow debridement or even washing these off in the vigorous fashion with Anasept. 10/23/17; large wounds on her bilateral malleoli which are chronic. Some improvement in terms of size perhaps on the right since last time I saw these. She states that after we increased the 3 layer compression there was some bleeding, when she came in for a nurse visit she did not want 3 layer compression put back on about our nurse managed to convince her. She has known chronic venous visit issues and I'm hoping to get her to tolerate the 3 layer compression. using Hydrofera Blue 10/30/17; absolutely no change in the condition of either wound although we've had some improvement in dimensions on the right.. Attempted to put her in 3 layer compression she didn't tolerated she is back in  2 layer compression. We've been using Hydrofera Blue We looked over her past records. She had venous reflux studies in November 2016. There was no evidence of deep venous reflux on the right. Superficial vein did not show the greater saphenous vein at think this is been previously ablated the small saphenous vein was within normal limits. The left deep venous system showed no DVT the vessels were positive for deep venous reflux in the posterior tibial veins at the ankle. The greater saphenous vein was surgically absent small saphenous vein was within normal limits. She went to vein and vascular at [facility]. I believe she had an ablation on the left greater saphenous vein. I'll update her reflux studies perhaps ever reviewed by vein and vascular.  We've made absolutely no progress in these wounds. Will also try to read and TheraSkins through her insurance 11/06/17; W the patient apparently has a 2 week follow-up with vein and vascular I like him to review the whole issue with regards to her previous vascular workup by Dr. Aleda [name]. We've really made no progress on these wounds in many months. She arrives today with less viable looking surface on the left medial ankle wound. This was apparently looking about the same on Tuesday when she was here for nurse visit. 11/13/17; deep tissue culture I did last time of the left lower leg showed multiple organisms without any predominating. In particular no Staphylococcus or group A strep were isolated. We sent her for venous reflux studies. She's had a previous left greater saphenous vein stripping and I think sclerotherapy of the right greater saphenous vein. She didn't really look at the lesser saphenous vein this both wounds are on the medial aspect. She has reflux in the common femoral vein and popliteal vein and an accessory vein on the right and the common femoral vein and popliteal vein on the left. I'm going to have her go to see vein and vascular  just the look over things and see if anything else beside aggressive compression is indicated here. We have not been able to make any progress on these wounds in spite of the fact that the surface of the wounds is never look too bad. 11/20/17; no major change in the condition of the wounds. Patient reports a large amount of drainage. She has a lot of complaints of pain although enlisting her today I wonder if some of this at least his neuropathic rather than secondary to her wounds. She has an appointment with vein and vascular on 12/30/17. The refractory nature of these wounds in my mind at least need vein and vascular to look over the wounds the recent reflux studies we did and her history to see if anything further can be done here. I also note her gait is deteriorated quite a bit. 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 She has a bilateral Trendelenburg gait. I wonder if this is neuropathic or perhaps multilevel radicular. 11/27/17; her wounds actually looks slightly better. Healthy-looking granulation tissue a scant amount of epithelialization. [place] healthcare will not pay for [employer]. They will play for tri layer Oasis and Dermagraft. This is not a diabetic ulcer. We'll try for the tri layer Oasis. She still complains of some drainage. She has a vein and vascular appointment on 12/30/17 12/04/17; the wounds visually look quite good. Healthy-looking granulation with some degree of epithelialization. We are still waiting for response to our request for trial to try layer Oasis. Her appointment with vascular to review venous and arterial issues isn't sold the end of July 7/31. Not allow debridement or even vigorous cleansing of the wound surface. 12/18/17; slightly smaller especially on the right. 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 We've been using Hydrofera Blue. We still are looking into triple layer Oasis through her insurance 01/08/18 on evaluation  today patient's wound actually appears to be showing signs of good improvement at this point in time. She has been tolerating the dressing changes without complication. Fortunately there does not appear to be any evidence of infection at this point in time. We have been utilizing silver nitrate which does seem to be of benefit for her which is also good news. Overall I'm very happy with how things seem to  be both regards appearance as well as measurement. Patient did see Dr. Bridgett Larsson for evaluation on 12/30/17. In his assessment he felt that stripping would not likely add much more than chronic compression to the patient's healing process. His recommendation was to follow-up in three months with Dr. Doren Custard if she hasn't healed in order to consider referral back to you and see vascular where she previously was in a trial and was able to get her wound to heal. I'll be see what she feels she when you staying compression and he reiterated this as well. 01/13/18 on evaluation today patient appears to actually be doing very well in regard to her bilateral medial malleolus ulcers. She seems to have tolerated the chemical cauterization with silver nitrate last week she did have some pain through that evening but fortunately states that I'll be see since it seems to be doing better she is overall pleased with the progress. 01/21/18; really quite a remarkable improvement since I've last seen these wounds. We started using silver nitrate specially on the islands of hyper granulation which for some reason her around the wound circumference. This is really done quite nicely. Primary dressing Hydrofera Blue under 4 layer compression. She seems to be able to hold out without a nurse rewrap. Follow-up in 1 week 01/28/18; we've continued the hydrofera blue but continued with chemical cauterization to the wound area that we started about a month ago for irregular hyper granulation. She is made almost stunning improvement in the  overall wound dimensions. I was not really expecting this degree of improvement in these chronic wounds 02/05/18; we continue with Hydrofera Bluebut of also continued the aggressive chemical cauterization with silver nitrate. We made nice progress with the right greater than left wound. 02/12/18. We continued with Hydrofera Blue after aggressive chemical cauterization with silver nitrate. We appear to be making nice progress with both wound areas 02/19/2018; we continue with [facility] after washing the wounds vigorously with Anasept spray and chemical cauterization with silver nitrate. We are making excellent progress. The area on the right's just about closed 02/26/2018. The area on the left medial ankle had too much necrotic debris today. I used a #5 curette we are able to get most of the soft. I continued with the silver nitrate to the much smaller wound on the right medial ankle she had a new area on her right lower pretibial area which she says was due to a role in her compression 03/05/2018; both wound areas look healthy. Not much change in dimensions from last week. I continue to use silver nitrate and Hydrofera Blue. The patient saw Dr. Doren Custard of vein and vascular. He felt she had venous stasis ulcers. He felt based on her previous arteriogram she should have adequate circulation for healing. Also she has deep venous reflux but really no significant correctable superficial venous reflux at this time. He felt we should continue with conservative management including leg elevation and compression 04/02/2018; since we last saw this woman about a month ago she had a fall apparently suffered a pelvic fracture. I did not look up the x-ray. Nevertheless because of pain she literally was bedbound for 2 weeks and had home health coming out to change the dressing. Somewhat predictably this is resulted in considerable improvement in both wound areas. The right is just about closed on the medial malleolus  and the left is about half the size. 04/16/2018; both her wounds continue to go down in size. Using Hydrofera Blue. 05/07/18; both  her wounds appeared to be improving especially on the right where it is almost closed. We are using Hydrofera Blue 05/14/2018; slightly worse this week with larger wounds. Surface on the left medial not quite as good. We have been using Hydrofera Blue 05/21/18; again the wounds are slightly larger. Left medial malleolus slightly larger with eschar around the circumference. We have been using Hydrofera Blue undergoing a wraps for a prolonged period of time. This got a lot better when she was more recumbent due to a fall and a back injury. I change the primary dressing the silver alginate today. She did not tolerate a 4 layer compression previously although I may need to bring this up with her next time 05/28/2018; area on the left medial malleolus again is slightly larger with more drainage. Area on the right is roughly unchanged. She has a small area of folliculitis on the right medial just on the lower calf. This does not look ominous. 06/03/2018 left medial malleolus slightly smaller in a better looking surface. We used silver nitrate on this last time with silver alginate. The area on the right appears slightly smaller 1/10; left medial malleolus slightly smaller. Small open area on the right. We used silver nitrate and silver alginate as of 2 weeks ago. We continue with the wound and compression. These got a lot better when she was off her feet 1/17; right medial malleolus wound is smaller. The left may be slightly smaller. Both surfaces look somewhat better. 1/24; both wounds are slightly smaller. Using silver alginate under Unna boots 1/31; both wounds appear smaller in fact the area on the right medial is just about closed. Surface eschar. We have been using silver alginate under Unna boots. The patient is less active now spends let much less time on her feet and I  think this is contributed to the general improvement in the wound condition 2/7; both wounds appear smaller. I was hopeful the right medial would be closed however there there is still the same small open area. Slight amount of surface eschar on the left the dimensions are smaller there is eschar but the wound edges appear to be free. We have been using silver alginate under Unna boot's 2/14; both wounds once again measure smaller. Circumferential eschar on the left medial. We have been using silver alginate under Unna boots with gradual improvement 2/21; the area on the right medial malleolus has healed. The area on the left is smaller. We have been using silver alginate and Unna boots. We can discharge wrapping the right leg she has 20/30 stockings at home she will need to protect the scar tissue in this area 2/28; the area on the right medial malleolus remains closed the patient has a compression stocking. The area on the left is smaller. We have been using silver alginate and Unna boots. 3/6 the area on the right medial ankle remains closed. Good edema control noted she is using her own compression stocking. The area on the left medial ankle is smaller. We have been managing this with silver alginate and Unna boots which we will continue today. 3/13; the area on the right medial ankle remains closed and I'm declaring it healed today. When necessary the left is about the same still a healthy-looking surface but no major change and wound area. No evidence of infection and using silver alginate under unna and generally making considerable improvement 3/27 the area on the right medial ankle remains closed the area on the left is about  the same as last week. Certainly not any worse we have been using silver alginate under an Unna boot 4/3; the area on the right medial ankle remains closed per the patient. We did not look at this wound. The wound on the left medial ankle is about the same surface looks  healthy we have been using silver alginate under an Unna boot 4/10; area on the right medial ankle remains closed per the patient. We did not look at this wound. The wound on the left medial ankle is slightly larger. The patient complains that the [facility] caused burning pain all week. She also told us that she was a lot more active this week. Changed her back to silver alginate 4/17; right medial ankle still closed per the patient. Left medial ankle is slightly larger. Using silver alginate. She did not tolerate Hydrofera Blue on this area 4/24; right medial ankle remains closed we have not look at this. The left medial ankle continues to get larger today by about a centimeter. We have been using silver alginate under Unna boots. She complains about 4 layer compression as an alternative. She has been up on her feet working on her garden 5/8; right medial ankle remains closed we did not look at this. The left medial ankle has increased in size about 100%. We have been using silver alginate under Unna boots. She noted increased pain this week and was not surprised that the wound is deteriorated 5/15; no major change in SA however much less erythema ( one week of doxy ocellulitis). 5/22-65 year old female returns at 1 week to the clinic for left medial ankle wound for which we have been using silver alginate under 3 layer compression She was placed on DOXY at last visit - the wound is wider at this visit. She is in 3 layer compression 5/29; change to [facility] last week. I had given her empiric doxycycline 2 weeks ago for a week. She is in 3 layer compression. She complains of a lot of pain and drainage on presentation today. 6/5; using Hydrofera Blue. I gave her doxycycline recently empirically for erythema and pain around the wound. Believe her cultures showed enterococcus which not would not have been well covered by doxycycline nevertheless the wound looks better and I don't feel  specifically that the enterococcus needs to be covered. She has a new what looks like a wrap injury on her lateral left ankle. 6/12; she is using Hydrofera Blue. She has a new area on the left anterior lower tibial area. This was a wrap injury last week. 6/19; the patient is using Hydrofera Blue. She arrived with marked inflammation and erythema around the wound and tenderness. 12/01/18 on evaluation today patient appears to be doing a little bit better based on what I'm hearing from the standpoint of lassos evaluation to this as far as the overall appearance of the wound is concerned. Then sometime substandard she typically sees Dr. Dellia Nims. Nonetheless overall very pleased with the progress that she's made up to this point. No fevers, chills, nausea, or vomiting noted at this time. 7/10; some improvement in the surface area. Aggressively debrided last week apparently. I went ahead with the debridement today although the patient does not tolerate this very well. We have been using Iodoflex. Still a fair amount of drainage 7/17; slightly smaller. Using Iodoflex. 7/24; no change from last week in terms of surface area. We have been using Iodoflex. Surface looks and continues to look somewhat better 7/31; surface  area slightly smaller better looking surface. We have been using Iodoflex. This is under Unna boot compression 8/7-Patient presents at 1 week with Unna boot and Iodoflex, wound appears better 8/14-Patient presents at 1 week with Iodoflex, we use the Unna boot, wound appears to be stable better.Patient is getting Botox treatment for the inversion of the foot for tendon release, Next week 8/21; we are using Iodoflex. Unna boot. The wound is stable in terms of surface area. Under illumination there is some areas of the wound that appear to be either epithelialized or perhaps this is adherent slough at this point I was not really clear. It did not wipe off and I was reluctant to debride this  today. 8/28; we are using Iodoflex in an Unna boot. Seems to be making good improvement. 9/4; using Iodoflex and wound is slightly smaller. 9/18; we are using Iodoflex with topical silver nitrate when she is here. The wound continues to be smaller 10/2; patient missed her appointment last week due to GI issues. She left and Iodoflex based dressing on for 2 weeks. Wound is about the same size about the size of a dime on the left medial lower 10/9 we have been using Iodoflex on the medial left ankle wound. She has a new superficial probable wrap injury on the dorsal left ankle 10/16; we have been using Hydrofera Blue since last week. This is on the left medial ankle 10/23; we have been using Hydrofera Blue since 2 weeks ago. This is on the left medial ankle. Dimensions are better 11/6; using Hydrofera Blue. I think the wound is smaller but still not closed. Left medial ankle 11/13; we have been using Hydrofera Blue. Wound is certainly no smaller this week. Also the surface not as good. This is the remanent of a very large area on her left medial ankle. 11/20; using Sorbact since last week. Wound was about the same in terms of size although I was disappointed about the surface debris 12/11; 3-week follow-up. Patient was on vacation. Wound is measuring slightly larger we have been using Sorbact. 12/18; wound is about the same size however surface looks better last week after debridement. We have been using Sorbact under compression 1/15 wound is probably twice the size of last time increased in length nonviable surface. We have been using Sorbact. She was running a mild fever and missed her appointment last week 1/22; the wound is come down in size but under illumination still a very adherent debris we have been Hydrofera Blue that I changed her to last week 1/29; dimensions down slightly. We have been using Hydrofera Blue 2/19 dimensions are the same however there is rims of epithelialization under  illumination. Therefore more the surface area may be epithelialized 2/26; the patient's wound actually measures smaller. The wound looks healthy. We have been using Hydrofera Blue. I had some thoughts about running Apligraf then I still may do that however this looks so much better this week we will delay that for now 3/5; the wound is small but about the same as last week. We have been using Hydrofera Blue. No debridement is required today. 3/19; the wound is about the size of a dime. Healthy looking wound even under illumination. We have been using Hydrofera Blue. No mechanical debridement is necessary 3/26; not much change from last week although still looks very healthy. We have been using Hydrofera Blue under Unna boots Patient was offered an ankle fusion by podiatry but not until the wound heals with  a proceed with this. 4/9; the patient comes in today with her original wound on the medial ankle looking satisfactory however she has some uncontrolled swelling in the middle part of her leg with 2 new open areas superiorly just lateral to the tibia. I think this was probably a wrap issue. She said she felt uncomfortable during the week but did not call in. We have been using Hydrofera Blue 4/16; the wound on the medial ankle is about the same. She has innumerable small areas superior to this across her mid tibia. I think this is probably folliculitis. She is also been working in the yard doing a lot of sweating 4/30; the patient issue on the upper areas across her mid tibia of all healed. I think this was excessive yard work if I remember. Her wound on the medial ankle is smaller. Some debris on this we have been using Hydrofera Blue under Unna boots 5/7; mid tibia. She has been using Hydrofera Blue under an Unna wrap. She is apparently going for her ankle surgery on June 3 10/28/19-Patient returns to clinic with the ankle wound, we are using Hydrofera Blue under Unna wrap, surgery is scheduled for  her left foot for June 3 so she will be back for nurse visit next week READMISSION 01/17/2020 [name] is a 65 year old woman we have had in this clinic for a long period of time with severe venous hypertension and refractory wounds on her medial lower legs and ankles bilaterally. 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 When she was off her legs for a prolonged period example when she fell and suffered a compression fracture things would heal up quite nicely. She is now retired and we managed to heal up the right medial leg wound. The left one was very tiny last time I saw this although still refractory. 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 It got to the point where this was interfering with her walking and she elected to proceed with a ankle arthrodesis to straighten her her ankle and leave her with a functional outcome for mobilization. The patient was referred to Dr. Doren Custard and really this took some time to arrange. Dr. Doren Custard saw her on 12/07/2019. Once again he verified that she had no arterial issues. She had previously had an angiogram several years ago. Follow-up ABIs on the left showed an ABI of 1.12 with triphasic waveforms and a TBI of 0.92. She is felt to have chronic deep venous insufficiency but I do not think it was felt that anything could be done from about this from an ablation point of view. At the time Dr. Doren Custard saw this patient the wounds actually look closed via the pictures in his clinic. The patient finally underwent her surgery on 12/15/2019. This went reasonably well and there was a good anatomic outcome. She developed a small distal wound dehiscence on the lateral part of the surgical wound. However more problematically she is developed recurrence of the  wound on the medial left ankle. There are actually 2 wounds here one in the distal lower leg and 1 pretty much at the level of the medial malleolus. It is a more distal area that is more problematic. She has been using Hydrofera Blue which started on Friday before this she  was simply Ace wrapping. There was a culture done that showed Pseudomonas and she is on ciprofloxacin. A recent CNS on 8/11 was negative. The patient reports some pain but I generally think this is improving. She is using a cam boot completely nonweightbearing using a walker for pivot transfers and a wheelchair 8/24; not much improvement unfortunately she has a surgical wound on the lateral part in the venous insufficiency wound medially. The bottom part of the medial insufficiency wound is still necrotic there is exposed tendon here. We have been using Hydrofera Blue under compression. Her edema control is however better 8/31; patient in for follow-up of his surgical wound on the lateral part of her left leg and chronic venous insufficiency ulcers medially. We put her back in compression last week. She comes in today with a complaint of 3 or 4 days worth of increasing pain. She felt her cam walker was rubbing on the area on the back of her heel. However there is intense erythema seems more likely she has cellulitis. She had 2 cultures done when she was seeing podiatry in the postop. One of them in late July showed Pseudomonas and she received a course of ciprofloxacin the other was negative on 8/11 she is allergic to penicillin with anaphylactoid complaints of hives oral swelling via information in epic 9/9; when I saw this patient last week she had intense anterior erythema around her wound on the right lateral heel and ankle and also into the right medial heel. Some of this was no doubt drainage and her walker boot however I was convinced she had cellulitis. I gave her Levaquin and Bactrim she is finishing up on this now. She is  following up with Dr. [name] he saw her yesterday. He is taken her out of the walking boot of course she is still nonweightbearing. Her x-ray was negative for any worrisome features such as soft tissue air etc. Things are a lot better this week. She has home health. We have been using Hydrofera Blue under an [employer] which she put back on yesterday. I did not wrap her last week 9/17; her surrounding skin looks a lot better. In fact the area on the left lateral ankle has just a scant amount of eschar. The only remaining wound is the large area on the left medial ankle. Probably about 60% of this is healthy granulation at the surface however she has a significant divot distally. This has adherent debris in it. I been using debridement and silver collagen to try and get this area to fill-in although I do not think we have made much progress this week 9/24; the patient's wound on the left medial ankle looks a lot better. The deeper divot area distally still requires debridement but this is cleaning up quite nicely we have been using silver collagen. The patient is complaining of swelling in her foot and is worried that that is contributing to the nonhealing of the ankle wound. She is also complaining of numbness in her anterior toes 10/4; left medial ankle. The small area distally still has a divot with necrotic material that I have been debriding away. This has an undermining area. She is approved for Apligraf. She saw Dr. [name] her surgeon on 10/1. I think he declared himself is satisfied with the condition of things. Still nonweightbearing till the end of the month. 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 Her surgical wound is well closed. There is no evidence of  infection 10/11; the patient arrived in clinic today with the expectation that we be able to put an Apligraf on this area after debridement however she arrives with a relatively large amount of green drainage  on the dressing. The patient states that this started on Friday. She has not been systemically unwell. 10/19; culture I did last week showed both Enterococcus and Pseudomonas. I think this came in separate parts because I stopped her ciprofloxacin I gave her and prescribed her linezolid however now looking at the final culture result this is Pseudomonas which is resistant to quinolones. She has not yet picked up the linezolid apparently phone issues. We are also trying to get a topical antibiotic out of [place] in [place] they can be applied by home health. She is still having green drainage 10/16; the patient has her topical antibiotic from [facility] in [place]. This is a compounded gel with vancomycin and ciprofloxacin and gentamicin. We are applying this on the wound bed with silver alginate over the top with Unna boot wraps. She arrives in clinic today with a lot less ominous looking drainage although she is only use this topical preparation once the second time today. She sees Dr. [name] her surgeon on Friday she has home health changing the dressing 11/2; still using her compounded topical antibiotic under silver alginate. Surface is cleaning up there is less drainage. We had an Apligraf for her today and I elected to apply it. A light coating of her antibiotic 04/25/2020 upon evaluation today patient appears to be doing well with regard to her ankle ulcer. There is a little bit of slough noted on the surface of the wound I am can have to perform sharp debridement to clear this away today. With that being said other than that fact overall I feel like she is making progress and we do see some new epithelial growth. There is also some improvement in the depth of the wound and that distal portion. There is little bit of slough there as well. 12/7; 2-week follow-up. Apligraf #3. Dimensions are smaller. Closing in especially inferiorly. Still some surface debris. Still using the  [facility] topical antibiotic but I told her that I don't think this needs to be renewed 12/21; 2-week follow-up. Apligraf #4 dimensions are smaller. Nice improvement 06/05/2020; 2-week follow-up. The patient's wound on the left medial ankle looks really excellent. Nice granulation. Advancing epithelialization no undermining no evidence of infection. We would have to reapply for another Apligraf but with the condition of this wound I did not feel strongly about it. We used Hydrofera Blue under the same degree of compression. She follows up with Dr. [name] her surgeon a week Friday 06/13/2020 upon evaluation today patient appears to be doing excellent in regard to her wound. She has been tolerating the dressing changes without complication. Fortunately there is no signs of active infection at this time. No fevers, chills, nausea, vomiting, or diarrhea. She was using Hydrofera Blue last week. 06/20/2020 06/20/2020 on evaluation today patient actually appears to be doing excellent in regard to her wound. This is measuring better and looking much better as well. She has been using the collagen that seems to be doing better for her as well even though the [facility] was and is not sticking or feeling as rough on her wound. She did see Dr. [name] on Friday he is very pleased he also stated none of the hardware has shifted. That is great news 1/27; the patient has a small clean  wound all that is remaining. I agree that this is too small to really consider an Apligraf. Under illumination the surface is looking quite good. We have been using collagen although the dimensions are not any better this week 2/2; the patient has a small clean wound on the left medial ankle. Although this left of her substantial original areas. Measurements are smaller. We have been using polymen Ag under an Haematologist. 2/10; small area on the left medial ankle. This looks clean nothing to debride however dimensions are about the same we  have been using polymen I think now for 2 weeks 2/17; not much change in surface area. We have been using polymen Ag without any improvement. 3/17; 1 month follow-up. The patient has been using endoform without any improvement in fact I think this looks worse with more depth and more expansion 3/24; no improvement. Perhaps less debris on the surface. We have been using Sorbact for 1 week 4/4; wound measures larger. She has edema in her leg and her foot which she tells as her wrap came down. We have been using Unna boots. Sorbact of the wound. She has been approved for Apligraf 09/12/2020 upon evaluation today patient appears to be doing well with regard to her wound. We did get the Apligraf reapproved which is great news we have that available for application today. Fortunately there is no signs of infection and overall the patient seems to be doing great. The wound bed is nice and clean. 4/27; patient presents for her second application of Apligraf. She states over the past week she has been on her feet more often due to being outside in her garden. She has noted more swelling to her foot as a result. She denies increased warmth, pain or erythema to the wound site. 10/10/2020 upon evaluation today patient appears to be doing well with regard to her wound which does not appear to be quite as irritated as last week from what I am hearing. With that being said unfortunately she is having issues with some erythema and warmth to touch as well as an increase size. I do believe this likely is infected. Objective Constitutional Well-nourished and well-hydrated in no acute distress. Vitals Time Taken: 10:48 AM, Height: 68 in, Temperature: 98.1 F, Pulse: 59 bpm, Respiratory Rate: 17 breaths/min, Blood Pressure: 103/63 mmHg. Respiratory normal breathing without difficulty. Psychiatric this patient is able to make decisions and demonstrates good insight into disease process. Alert and Oriented x 3.  pleasant and cooperative. General Notes: Upon inspection patient's wound bed actually showed signs of good granulation epithelization at this point. There does not appear to be any significant slough buildup although there is erythema and warmth noted I do believe this is representation of cellulitis she is also had increased pain which again I thinks corresponds with this. Subsequently I believe initiation of antibiotic would be appropriate currently. Integumentary (Hair, Skin) Wound #15 status is Open. Original cause of wound was Gradually Appeared. The date acquired was: 12/30/2019. The wound has been in treatment 38 weeks. The wound is located on the Left,Medial Malleolus. The wound measures 4cm length x 2.5cm width x 0.1cm depth; 7.854cm^2 area and 0.785cm^3 volume. There is Fat Layer (Subcutaneous Tissue) exposed. There is no tunneling or undermining noted. There is a medium amount of serosanguineous drainage noted. The wound margin is flat and intact. There is large (67-100%) red granulation within the wound bed. There is a small (1-33%) amount of necrotic tissue within the wound bed including  Adherent Slough. Assessment Active Problems ICD-10 Chronic venous hypertension (idiopathic) with ulcer and inflammation of left lower extremity Non-pressure chronic ulcer of other part of left lower leg with other specified severity Non-pressure chronic ulcer of left ankle with other specified severity Procedures Wound #15 Pre-procedure diagnosis of Wound #15 is a Venous Leg Ulcer located on the Left,Medial Malleolus . There was a Haematologist Compression Therapy Procedure by [name], RN. Post procedure Diagnosis Wound #15: Same as Pre-Procedure Plan Follow-up Appointments: Return Appointment in 1 week. - plan apligraf next week Bathing/ Shower/ Hygiene: May shower with protection but do not get wound dressing(s) wet. Edema Control - Lymphedema / SCD / Other: Elevate legs to the level of  the heart or above for 30 minutes daily and/or when sitting, a frequency of: - throughout the day Avoid standing for long periods of time. Exercise regularly Compression stocking or Garment 20-30 mm/Hg pressure to: - right leg daily Laboratory ordered were: Anerobic culture - left medial ankle The following medication(s) was prescribed: Cipro oral 500 mg tablet 1 1 tablet oral taken 2 times per day for 14 days starting 10/10/2020 WOUND #15: - Malleolus Wound Laterality: Left, Medial Cleanser: Soap and Water 1 x Per Week/ Discharge Instructions: May shower and wash wound with dial antibacterial soap and water prior to dressing change. Peri-Wound Care: Sween Lotion (Moisturizing lotion) 1 x Per Week/ Discharge Instructions: Apply moisturizing lotion as directed Prim Dressing: KerraCel Ag Gelling Fiber Dressing, 2x2 in (silver alginate) 1 x Per Week/ ary Discharge Instructions: Apply silver alginate to wound bed as instructed Secondary Dressing: Woven Gauze Sponge, Non-Sterile 4x4 in 1 x Per Week/ Discharge Instructions: Apply over primary dressing as directed. Secondary Dressing: ABD Pad, 5x9 1 x Per Week/ Discharge Instructions: Apply over primary dressing as directed. Com pression Wrap: Unnaboot w/Calamine, 4x10 (in/yd) 1 x Per Week/ Discharge Instructions: Apply Unnaboot as directed. 1. Would recommend currently that we going to continue with the compression wrap I think that is good. With that being said we will get a switch up the dressing will not use the Apligraf today we will get a use silver alginate dressings. 2. I am also can recommend that we have the patient continue to elevate her legs much as possible. Obviously I think the more that she can do the better. 3. I am also can recommend that we go ahead and start her on an antibiotic. I would recommend Cipro which is one that she is taken previously without complication. The patient is in agreement with that plan. I am also can  obtain a wound culture depending on the results of the culture will make any adjustments in the medication regimen as needed. We will see patient back for reevaluation in 1 week here in the clinic. If anything worsens or changes patient will contact our office for additional recommendations. Electronic Signature(s) Signed: 10/10/2020 11:42:35 AM By: [name] Keeler PA-C Entered By: [name] on 10/10/2020 11:42:35 -------------------------------------------------------------------------------- SuperBill Details Patient Name: Date of Service: [name]. 10/10/2020 Medical Record Number: [id number] Patient Account Number: [id number] Date of Birth/Sex: Treating RN: 08-06-1955 (65 y.o. 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 Facility Procedures CPT4 Code: [id number] Description: (Facility Use Only) [id number] - APPLY Louretta Parma BOOT LT Modifier: Quantity: 1 Physician Procedures : CPT4 Code Description Modifier [id number] - WC PHYS LEVEL 4 - EST PT ICD-10 Diagnosis Description I87.332 Chronic venous hypertension (idiopathic) with ulcer and inflammation of left lower extremity L97.828 Non-pressure chronic ulcer of other part  of left lower leg with other specified severity L97.328 Non-pressure chronic ulcer of left ankle with other specified severity Quantity: 1 Electronic Signature(s) Signed: 10/10/2020 11:42:59 AM By: [name] Keeler PA-C Entered By: [name] on 10/10/2020 11:42:59

## 2020-10-10 NOTE — Progress Notes (Signed)
[name], [name] ([id number]) Visit Report for 10/10/2020 Arrival Information Details Patient Name: Date of Service: [facility], [name] 10/10/2020 10:00 A M Medical Record Number: [id number] Patient Account Number: [id number] Date of Birth/Sex: Treating RN: Aug 05, 1955 (65 y.o. [name] Primary Care Provider: Lennie [name] Other Clinician: Referring Provider: Treating Provider/Extender: [name] in Treatment: 40 Visit Information History Since Last Visit Added or deleted any medications: No Patient Arrived: Cane Any new allergies or adverse reactions: No Arrival Time: 10:45 Had a fall or experienced change in No Accompanied By: self activities of daily living that may affect Transfer Assistance: None risk of falls: Patient Identification Verified: Yes Signs or symptoms of abuse/neglect since last visito No Secondary Verification Process Completed: Yes Hospitalized since last visit: No Patient Requires Transmission-Based Precautions: No Implantable device outside of the clinic excluding No Patient Has Alerts: Yes cellular tissue based products placed in the center Patient Alerts: L ABI =1.12, TBI = .92 since last visit: R ABI= 1.02, TBI= .58 Has Dressing in Place as Prescribed: Yes Pain Present Now: Yes Electronic Signature(s) Signed: 10/10/2020 4:50:41 PM By: [name] Entered By: [name] on 10/10/2020 10:48:24 -------------------------------------------------------------------------------- Compression Therapy Details Patient Name: Date of Service: [name] [name], [name] 10/10/2020 10:00 A M Medical Record Number: [id number] Patient Account Number: [id number] Date of Birth/Sex: Treating RN: 1956/02/15 (65 y.o. [name] Primary Care Provider: Lennie [name] Other Clinician: Referring Provider: Treating Provider/Extender: [name] in Treatment: 32 Compression Therapy Performed for Wound  Assessment: Wound #15 Left,Medial Malleolus Performed By: Clinician [name] Hammock, RN Compression Type: Rolena Infante Post Procedure Diagnosis Same as Pre-procedure Electronic Signature(s) Signed: 10/10/2020 6:13:52 PM By: [name] Gouty RN, BSN Entered By: [name] on 10/10/2020 11:31:38 -------------------------------------------------------------------------------- Encounter Discharge Information Details Patient Name: Date of Service: [name] [name], [name] 10/10/2020 10:00 A M Medical Record Number: [id number] Patient Account Number: [id number] Date of Birth/Sex: Treating RN: 06/11/1955 (65 y.o. [name], [name] Primary Care Provider: Lennie [name] Other Clinician: Referring Provider: Treating Provider/Extender: [name] in Treatment: 30 Encounter Discharge Information Items Discharge Condition: Stable Ambulatory Status: Ambulatory Discharge Destination: Home Transportation: Private Auto Accompanied By: self Schedule Follow-up Appointment: Yes Clinical Summary of Care: Patient Declined Electronic Signature(s) Signed: 10/10/2020 5:14:45 PM By: [name] Hammock RN Entered By: [name] on 10/10/2020 12:09:47 -------------------------------------------------------------------------------- Lower Extremity Assessment Details Patient Name: Date of Service: [name] [name], [name] 10/10/2020 10:00 A M Medical Record Number: [id number] Patient Account Number: [id number] Date of Birth/Sex: Treating RN: December 06, 1955 (65 y.o. [name] Primary Care Provider: Lennie [name] Other Clinician: Referring Provider: Treating Provider/Extender: [name] in Treatment: 38 Edema Assessment Assessed: [Left: No] [Right: No] Edema: [Left: Ye] [Right: s] Calf Left: Right: Point of Measurement: 31 cm From Medial Instep 28 cm Ankle Left: Right: Point of Measurement: 11 cm From Medial Instep 22 cm Vascular  Assessment Pulses: Dorsalis Pedis Palpable: [Left:Yes] Electronic Signature(s) Signed: 10/10/2020 5:58:33 PM By: [name] Hurst RN, BSN Entered By: [name] on 10/10/2020 11:04:39 -------------------------------------------------------------------------------- [place] Details Patient Name: Date of Service: [name] [name], [name] 10/10/2020 10:00 A M Medical Record Number: [id number] Patient Account Number: [id number] Date of Birth/Sex: Treating RN: [birth year] (65 y.o. [name] Primary Care Provider: Lennie [name] Other Clinician: Referring Provider: Treating Provider/Extender: [name] in Treatment: [place] reviewed with  physician Active Inactive Venous Leg Ulcer Nursing Diagnoses: Actual venous Insuffiency (use after diagnosis is confirmed) Knowledge deficit related to disease process and management Goals: Patient will maintain optimal edema control Date Initiated: 06/20/2020 Target Resolution Date: 11/07/2020 Goal Status: Active Interventions: Assess peripheral edema status every visit. Compression as ordered Treatment Activities: Therapeutic compression applied : 06/20/2020 Notes: Wound/Skin Impairment Nursing Diagnoses: Impaired tissue integrity Knowledge deficit related to ulceration/compromised skin integrity Goals: Patient/caregiver will verbalize understanding of skin care regimen Date Initiated: 01/17/2020 Target Resolution Date: 11/07/2020 Goal Status: Active Ulcer/skin breakdown will have a volume reduction of 30% by week 4 Date Initiated: 01/17/2020 Date Inactivated: 03/12/2020 Target Resolution Date: 03/09/2020 Goal Status: Met Interventions: Assess patient/caregiver ability to obtain necessary supplies Assess patient/caregiver ability to perform ulcer/skin care regimen upon admission and as needed Assess ulceration(s) every visit Provide education on ulcer and skin  care Notes: Electronic Signature(s) Signed: 10/10/2020 6:13:52 PM By: [name] Gouty RN, BSN Entered By: [name] on 10/10/2020 11:32:00 -------------------------------------------------------------------------------- Pain Assessment Details Patient Name: Date of Service: [name] [name], [name] 10/10/2020 10:00 [place] Record Number: [id number] Patient Account Number: [id number] Date of Birth/Sex: Treating RN: 01/28/1956 (65 y.o. [name] Primary Care Schawn Byas: [name] Other Clinician: Referring Maansi Wike: Treating Tashica Provencio/Extender: [name] in Treatment: 85 Active Problems Location of Pain Severity and Description of Pain Patient Has Paino Yes Site Locations Rate the pain. Current Pain Level: 4 Pain Management and Medication Current Pain Management: Electronic Signature(s) Signed: 10/10/2020 4:50:41 PM By: [name] Signed: 10/10/2020 6:13:52 PM By: [name] Gouty RN, BSN Entered By: [name] on 10/10/2020 10:48:51 -------------------------------------------------------------------------------- Patient/Caregiver Education Details Patient Name: Date of Service: [name] [name], [name] 5/11/2022andnbsp10:00 A M Medical Record Number: [id number] Patient Account Number: [id number] Date of Birth/Gender: Treating RN: Apr 27, 1956 (65 y.o. [name] Primary Care Physician: [name] Other Clinician: Referring Physician: Treating Physician/Extender: [name] in Treatment: 27 Education Assessment Education Provided To: Patient Education Topics Provided Infection: Methods: Explain/Verbal Responses: Reinforcements needed, State content correctly Venous: Methods: Explain/Verbal Responses: Reinforcements needed, State content correctly Electronic Signature(s) Signed: 10/10/2020 6:13:52 PM By: [name] Gouty RN, BSN Entered By: [name] on 10/10/2020  11:32:24 -------------------------------------------------------------------------------- Wound Assessment Details Patient Name: Date of Service: [name] [name], [name] 10/10/2020 10:00 A M Medical Record Number: [id number] Patient Account Number: [id number] Date of Birth/Sex: Treating RN: 02-21-1956 (65 y.o. [name] Primary Care Barbi Kumagai: [name] Other Clinician: Referring Eligh Rybacki: Treating Anakaren Campion/Extender: [name] in Treatment: 51 Wound Status Wound Number: 15 Primary Venous Leg Ulcer Etiology: Wound Location: Left, Medial Malleolus Wound Status: Open Wounding Event: Gradually Appeared Comorbid Peripheral Venous Disease, Osteoarthritis, Confinement Date Acquired: 12/30/2019 History: Anxiety [name] Of Treatment: 38 Clustered Wound: No Photos Wound Measurements Length: (cm) 4 Width: (cm) 2.5 Depth: (cm) 0.1 Area: (cm) 7.854 Volume: (cm) 0.785 % Reduction in Area: 32.7% % Reduction in Volume: 91.6% Epithelialization: None Tunneling: No Undermining: No Wound Description Classification: Full Thickness With Exposed Support Structures Wound Margin: Flat and Intact Exudate Amount: Medium Exudate Type: Serosanguineous Exudate Color: red, brown Foul [name] After Cleansing: No Slough/Fibrino Yes Wound Bed Granulation Amount: Large (67-100%) Exposed Structure Granulation Quality: Red Fascia Exposed: No Necrotic Amount: Small (1-33%) Fat Layer (Subcutaneous Tissue) Exposed: Yes Necrotic Quality: Adherent Slough Tendon Exposed: No Muscle Exposed: No Joint Exposed: No Bone Exposed: No Treatment Notes Wound #15 (Malleolus) Wound Laterality: Left, Medial Cleanser Soap and  Water Discharge Instruction: May shower and wash wound with dial antibacterial soap and water prior to dressing change. Peri-Wound Care Sween Lotion (Moisturizing lotion) Discharge Instruction: Apply moisturizing lotion as directed Topical Primary  Dressing KerraCel Ag Gelling Fiber Dressing, 2x2 in (silver alginate) Discharge Instruction: Apply silver alginate to wound bed as instructed Secondary Dressing Woven Gauze Sponge, Non-Sterile 4x4 in Discharge Instruction: Apply over primary dressing as directed. ABD Pad, 5x9 Discharge Instruction: Apply over primary dressing as directed. Secured With Compression Wrap Unnaboot w/Calamine, 4x10 (in/yd) Discharge Instruction: Apply Unnaboot as directed. Compression Stockings Add-Ons Electronic Signature(s) Signed: 10/10/2020 4:50:41 PM By: [name] Signed: 10/10/2020 6:13:52 PM By: [name] Gouty RN, BSN Entered By: [name] on 10/10/2020 16:21:42 -------------------------------------------------------------------------------- Vitals Details Patient Name: Date of Service: [name] [name], [name] 10/10/2020 10:00 A M Medical Record Number: [id number] Patient Account Number: [id number] Date of Birth/Sex: Treating RN: 1955/09/11 (65 y.o. [name] Primary Care Provider: Lennie [name] Other Clinician: Referring Provider: Treating Provider/Extender: [name] in Treatment: 37 Vital Signs Time Taken: 10:48 Temperature (F): 98.1 Height (in): 68 Pulse (bpm): 59 Respiratory Rate (breaths/min): 17 Blood Pressure (mmHg): 103/63 Reference Range: 80 - 120 mg / dl Electronic Signature(s) Signed: 10/10/2020 4:50:41 PM By: [name] Entered By: [name] on 10/10/2020 10:48:43

## 2020-10-15 LAB — AEROBIC CULTURE W GRAM STAIN (SUPERFICIAL SPECIMEN)

## 2020-10-17 ENCOUNTER — Encounter (HOSPITAL_BASED_OUTPATIENT_CLINIC_OR_DEPARTMENT_OTHER): Payer: Medicare Other | Admitting: Physician Assistant

## 2020-10-17 ENCOUNTER — Other Ambulatory Visit: Payer: Self-pay

## 2020-10-17 DIAGNOSIS — I87332 Chronic venous hypertension (idiopathic) with ulcer and inflammation of left lower extremity: Secondary | ICD-10-CM | POA: Diagnosis not present

## 2020-10-17 NOTE — Progress Notes (Addendum)
[name], [name] ([id number]) Visit Report for 10/17/2020 Chief Complaint Document Details Patient Name: Date of Service: [facility] [name], [name] 10/17/2020 9:15 A M Medical Record Number: [id number] Patient Account Number: [id number] Date of Birth/Sex: Treating RN: [birth year] (65 y.o. [name] Provider: [name] Other Clinician: Referring Provider: Treating Provider/Extender: [name] in Treatment: 57 Information Obtained from: Patient Chief Complaint patient is been followed long-term in this clinic for venous insufficiency ulcers with inflammation, hypertension and ulceration over the medial ankle bilaterally. 01/17/2020; this is a patient who is here for review of postoperative wounds on the left lateral ankle and recurrence of venous stasis ulceration on the left medial Electronic Signature(s) Signed: 10/17/2020 10:06:44 AM By: [name] PA-C Entered By: [name] on 10/17/2020 10:06:43 -------------------------------------------------------------------------------- Cellular or Tissue Based Product Details Patient Name: Date of Service: [facility] [name], [name] 10/17/2020 9:15 A M Medical Record Number: [id number] Patient Account Number: [id number] Date of Birth/Sex: Treating RN: 05/27/1956 (65 y.o. [name] Provider: [name] Other Clinician: Referring Provider: Treating Provider/Extender: [name] in Treatment: 40 Cellular or Tissue Based Product Type Wound #15 Left,Medial Malleolus Applied to: Performed By: Physician [name], PA Cellular or Tissue Based Product Type: Apligraf Level of Consciousness (Pre-procedure): Awake and Alert Pre-procedure Verification/Time Out Yes - 10:10 Taken: Location: trunk / arms / legs Wound Size (sq cm): 9.5 Product Size (sq cm): 44 Waste Size (sq cm): 22 Waste Reason: wound size Amount of Product Applied (sq cm):  22 Instrument Used: Blade, Forceps, Scissors Lot #: [id number] Order #: [id number] Expiration Date: 10/25/2020 Fenestrated: Yes Instrument: Blade Reconstituted: Yes Solution Type: saline Solution Amount: 5 ml Lot #: [id number] Solution Expiration Date: 11/30/2021 Secured: Yes Secured With: Steri-Strips Dressing Applied: Yes Primary Dressing: adaptic, gauze Procedural Pain: 0 Post Procedural Pain: 0 Response to Treatment: Procedure was tolerated well Level of Consciousness (Post- Awake and Alert procedure): Post Procedure Diagnosis Same as Pre-procedure Electronic Signature(s) Signed: 10/17/2020 1:22:22 PM By: [name] PA-C Signed: 10/17/2020 6:10:17 PM By: Baruch Gouty RN, BSN Entered By: Baruch Gouty on 10/17/2020 10:16:24 -------------------------------------------------------------------------------- Debridement Details Patient Name: Date of Service: [name], [name] 10/17/2020 9:15 A M Medical Record Number: [id number] Patient Account Number: [id number] Date of Birth/Sex: Treating RN: 01-08-1956 (65 y.o. [name] Provider: [name] Other Clinician: Referring Provider: Treating Provider/Extender: [name] in Treatment: 39 Debridement Performed for Assessment: Wound #15 Left,Medial Malleolus Performed By: Physician [name], PA Debridement Type: Debridement Severity of Tissue Pre Debridement: Fat layer exposed Level of Consciousness (Pre-procedure): Awake and Alert Pre-procedure Verification/Time Out Yes - 09:55 Taken: Start Time: 09:58 Pain Control: Lidocaine 5% topical ointment T Area Debrided (L x W): otal 3.8 (cm) x 2.5 (cm) = 9.5 (cm) Tissue and other material debrided: Viable, Non-Viable, Slough, Subcutaneous, Slough Level: Skin/Subcutaneous Tissue Debridement Description: Excisional Instrument: Curette Bleeding: Minimum Hemostasis Achieved: Pressure End Time: 10:02 Procedural Pain:  3 Post Procedural Pain: 1 Response to Treatment: Procedure was tolerated well Level of Consciousness (Post- Awake and Alert procedure): Post Debridement Measurements of Total Wound Length: (cm) 3.8 Width: (cm) 2.5 Depth: (cm) 0.1 Volume: (cm) 0.746 Character of Wound/Ulcer Post Debridement: Improved Severity of Tissue Post Debridement: Fat layer exposed Post Procedure Diagnosis Same as Pre-procedure Electronic Signature(s) Signed: 10/17/2020 1:22:22 PM By: [name] PA-C Signed: 10/17/2020 6:10:17 PM By:  Baruch Gouty RN, BSN Entered By: Baruch Gouty on 10/17/2020 10:13:43 -------------------------------------------------------------------------------- HPI Details Patient Name: Date of Service: [name] [name], [name] 10/17/2020 9:15 A M Medical Record Number: [id number] Patient Account Number: [id number] Date of Birth/Sex: Treating RN: Sep 11, 1955 (65 y.o. [name] Provider: [name] Other Clinician: Referring Provider: Treating Provider/Extender: [name] in Treatment: 65 History of Present Illness HPI Description: the remaining wound is over the left medial ankle. Similar wound over the right medial ankle healed largely with use of Apligraf. Most recently we have been using Hydrofera Blue over this wound with considerable improvement. The patient has been extensively worked up in the past for her venous insufficiency and she is not a candidate for antireflux surgery although I have none of the details available currently. 08/24/14; considerable improvement today. About 50% of this wound areas now epithelialized. The base of the wound appears to be healthier granulation.as opposed to last week when she had deteriorated a considerable improvement 08/17/14; unfortunately the wound has regressed somewhat. The areas of epithelialization from the superior aspect are not nearly as healthy as they were last week. The patient  thinks her Hydrofera Blue slipped. 09/07/14; unfortunately the area has markedly regressed in the 2 weeks since I've seen this. There is an odor surrounding erythema. The healthy granulation tissue that we had at the base of the wound now is a dusky color. The nurse reports green drainage 09/14/14; the area looks somewhat better than last week. There is less erythema and less drainage. The culture I did did not show any growth. Nevertheless I think it is better to continue the Cipro and doxycycline for a further week. The remaining wound area was debridement. 09/21/14. Wound did not require debridement last week. Still less erythema and less drainage. She can complete her antibiotics. The areas of epithelialization in the superior aspect of the wound do not look as healthy as they did some weeks ago 10/05/14 continued improvement in the condition of this wound. There is advancing epithelialization. Less aggressive debridement required 10/19/14 continued improvement in the condition and volume of this wound. Less aggressive debridement to the inferior part of this to remove surface slough and fibrinous eschar 11/02/14 no debridement is required. The surface granulation appears healthy although some of her islands of epithelialization seem to have regressed. No evidence of infection 11/16/14; lites surface debridement done of surface eschar. The wound does not look to be unhealthy. No evidence of infection. Unfortunately the patient has had podiatry issues in the right foot and for some reason has redeveloped small surface ulcerations in the medial right ankle. Her original presentation involved wounds in this area 11/23/14 no debridement. The area on the right ankle has enlarged. The left ankle wound appears stable in terms of the surface although there is periwound inflammation. There has been regression in the amount of new skin 11/30/14 no debridement. Both wound areas appear healthy. There was no evidence of  infection. The the new area on the right medial ankle has enlarged although that both the surfaces appear to be stable. 12/07/14; Debridement of the right medial ankle wound. No no debridement was done on the left. 12/14/14 no major change in and now bilateral medial ankle wounds. Both of these are very painful but the no overt evidence of infection. She has had previous venous ablation 12/21/14; patient states that her right medial ankle wound is considerably more painful last week than usual. Her left is  also somewhat painful. She could not tolerate debridement. The right medial ankle wound has fibrinous surface eschar 12/28/14 this is a patient with severe bilateral venous insufficiency ulcers. For a considerable period of time we actually had the one on the right medial ankle healed however this recently opened up again in June. The left medial ankle wound has been a refractory area with some absent flows. We had some success with Hydrofera Blue on this area and it literally closed by 50% however it is recently opened up Foley. Both of these were debridement today of surface eschar. She tolerates this poorly 01/25/15: No change in the status of this. Thick adherent escar. Very poor tolerance of any attempt at debridement. I had healed the right medial malleolus wound for a considerable amount of time and had the left one down to about 50% of the volume although this is totally regressed over the last 48 weeks. Further the right leg has reopened. she is trying to make a appointment with pain and vascular, previous ablations with Dr. Aleda Grana. I do not believe there is an arterial insufficiency issue here 02/01/15 the status of the adherent eschar bilaterally is actually improved. No debridement was done. She did not manage to get vascular studies done 02/08/15 continued debridement of the area was done today. The slough is less adherent and comes off with less pressure. There is no surrounding  infection peripheral pulses are intact 02/15/15 selective debridement with a disposable curette. Again the slough is less adherent and comes off with less difficulty. No surrounding infection peripheral pulses are intact. 02/22/15 selective debridement of the right medial ankle wound. Slough comes off with less difficulty. No obvious surrounding infection peripheral pulses are intact I did not debridement the one on the left. Both of these are stable to improved 03/01/15 selective debridement of both wound areas using a curette to. Adherent slough cup soft with less difficulty. No obvious surrounding infection. The patient tells me that 2 days ago she noted a rash above the right leg wrap. She did not have this on her lower legs when she change this over she arrives with widespread left greater than right almost folliculitis-looking rash which is extremely pruritic. I don't see anything to culture here. There is no rash on the rest of her body. She feels well systemically. 03/08/15; selective debridement of both wounds using a curette. Base of this does not look unhealthy. She had limegreen drainage coming out of the left leg wound and describes a lot of drainage. The rash on her left leg looks improved to. No cultures were done. 03/22/15; patient was not here last week. Basal wounds does not look healthy and there is no surrounding erythema. No drainage. There is still a rash on the left leg that almost looks vasculitic however it is clearly limited to the top of where the wrap would be. 04/05/15; on the right required a surgical debridement of surface eschar and necrotic subcutaneous tissue. I did not debridement the area on the left. These continue to be large open wounds that are not changing that much. We were successful at one point in healing the area on the right, and at the same time the area on the left was roughly half the size of current measurements. I think a lot of the deterioration has to  do with the prolonged time the patient is on her feet at work 04/19/15 I attempted-like surface debridement bilaterally she does not tolerate this. She tells me that she  was in allergic care yesterday with extreme pain over her left lateral malleolus/ankle and was told that she has an "sprain" 05/03/15; large bilateral venous insufficiency wounds over the medial malleolus/medial aspect of her ankles. She complains of copious amounts of drainage and his usual large amounts of pain. There is some increasing erythema around the wound on the right extending into the medial aspect of her foot to. historically she came in with these wounds the right one healed and the left one came down to roughly half its current size however the right one is reopened and the left is expanded. 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 05/10/15 large bilateral venous insufficiency wounds. There is less adherence surface left however the surface culture that I did last week grew pseudomonas therefore bilateral selective debridement score necessary. There is surrounding erythema. The patient describes severe bilateral drainage and a lot of pain in the left ankle. Apparently her podiatrist was were ready to do a cortisone shot 05/17/15; the patient complains of pain and again copious amounts of drainage. 05/24/15; we used Iodo flex last week. Patient notes considerable improvement in wound drainage. Only needed to change this once. 05/31/15; we continued Iodoflex; the base of these large wounds bilaterally is not too bad but there is probably likely a significant bioburden here. I would like to debridement just doesn't tolerate it. 06/06/14 I would like to continue the Iodoflex although she still hasn't managed to obtain supplies. She has bilateral medial malleoli or large wounds which are mostly superficial. Both of them are covered circumferentially with some nonviable  fibrinous slough although she tolerates debridement very poorly. She apparently has an appointment for an ablation on the right leg by interventional radiology. 06/14/15; the patient arrives with the wounds and static condition. We attempted a debridement although she does not do well with this secondary to pain. I 07/05/15; wounds are not much smaller however there appears to be a cleaner granulating base. The left has tight fibrinous slough greater than the right. Debridement is tolerated poorly due to pain. Iodoflex is done more for these wounds in any of the multitude of different dressings I have tried on the left 1 and then subsequently the right. 07/12/15; no change in the condition of this wound. I am able to do an aggressive debridement on the right but not the left. She simply cannot tolerate it. We have been using Iodoflex which helps somewhat. It is worthwhile remembering that at one point we healed the right medial ankle wound and the left was about 25% of the current circumference. We have suggested returning to vascular surgery for review of possible further ablations for one reason or another she has not been able to do this. 07/26/15 no major change in the condition of either wound on her medial ankle. I did not attempt to debridement of these. She has been aggressively scrubbing these while she is in the shower at home. She has her supply of Iodoflex which seems to have done more for these wounds then anything I have put on recently. 08/09/15 wound area appears larger although not verified by measurements. Using Iodoflex 09/05/2015 -- she was here for avisit today but had significant problems with the wound and I was asked to see her for a physician opinion. I have summarize that this lady has had surgery on her left lower extremity about 10 years ago where the possible veins  stripping was done. She has had an opinion from interventional radiology around November 2016 where no further  sclerotherapy was ordered. The patient works 12 hours a day and stands on a concrete floor with work boots and is unable to get the proper compression she requires and cannot elevate her limbs appropriately at any given time. She has recently grown Pseudomonas from her wound culture but has not started her ciprofloxacin which was called in for her. 09/13/15 this continues to be a difficult situation for this patient. At one point I had this wound down to a 1.5 x 1.5" wound on her left leg. This is deteriorated and the right leg has reopened. She now has substantial wounds on her medial calcaneus, malleoli and into her lower leg. One on the left has surface eschar but these are far too painful for me to debridement here. She has a vascular surgery appointment next week to see if anything can be done to help here. I think she has had previous ablations several years ago at [facility]. She has no major edema. She tells me that she did not get product last time [facility]] and went for several days without it. She continues to work in work boots 12 hours a day. She cannot get compression/4-layer under her work boots. 09/20/15 no major change. Periwound edema control was not very good. Her point with pain and vascular is next Wednesday the 25th 09/28/15; the patient is seen vascular surgery and is apparently scheduled for repeat duplex ultrasounds of her bilateral lower legs next week. 10/05/15; the patient was seen by Dr. Doren Custard of vascular surgery. He feels that she should have arterial insufficiency excluded as cause/contributed to her nonhealing stage she is therefore booked for an arteriogram. She has apparently monophasic signals in the dorsalis pedis pulses. She also of course has known severe chronic venous insufficiency with previous procedures as noted previously. I had another long discussion with the patient today about her continuing to work 12 hour shifts. I've written her out for 2 months area  had concerns about this as her work location is currently undergoing significant turmoil and this may lead to her termination. She is aware of this however I agree with her that she simply cannot continue to stand for 12 hours multiple days a week with the substantial wound areas she has. 10/19/15; the Dr. Doren Custard appointment was largely for an arteriogram which was normal. She does not have an arterial issue. He didn't make a comment about her chronic venous insufficiency for which she has had previous ablations. Presumably it was not felt that anything additional could be done. The patient is now out of work as I prescribed 2 weeks ago. Her wounds look somewhat less aggravated presumably because of this. I felt I would give debridement another try today 10/25/15; no major change in this patient's wounds. We are struggling to get her product that she can afford into her own home through her insurance. 11/01/15; no major change in the patient's wounds. I have been using silver alginate as the most affordable product. I spoke to Dr. Marla Roe last week with her requested take her to the OR for surgical debridement and placement of ACEL. Dr. Marla Roe told me that she would be willing to do this however [facility] will not cover this, fortunately the patient has [place] healthcare of some variant 11/08/15; no major change in the patient's wounds. She has been completely nonviable surface that this but is in  too much pain with any attempted debridement are clinic. I have arranged for her to see Dr. Marla Roe ham of plastic surgery and this appointment is on Monday. I am hopeful that they will take her to the OR for debridement, possible ACEL ultimately possible skin graft 11/22/15 no major change in the patient's wounds over her bilateral medial calcaneus medial malleolus into the lower legs. Surface on these does not look too bad however on the left there is surrounding erythema and tenderness.  This may be cellulitis or could him sleepy tinea. 11/29/15; no major changes in the patient's wounds over her bilateral medial malleolus. There is no infection here and I don't think any additional antibiotics are necessary. There is now plan to move forward. She sees Dr. Marla Roe in a week's time for preparation for operative debridement and ACEL placement I believe on 7/12. She then has a follow-up appointment with Dr. Marla Roe on 7/21 12/28/15; the patient returns today having been taken to the [place] by Dr. Marla Roe 12/12/15 she underwent debridement, intraoperative cultures [which were negative]. She had placement of a wound VAC. Parent really ACEL was not available to be placed. The wound VAC foam apparently adhered to the wound since then she's been using silver alginate, Xeroform under Ace wraps. She still says there is a lot of drainage and a lot of pain 01/31/16; this is a patient I see monthly. I had referred her to Dr. Marla Roe him of plastic surgery for large wounds on her bilateral medial ankles. She has been to the OR twice once in early July and once in early August. She tells me over the last 3 weeks she has been using the wound VAC with ACEL underneath it. On the right we've simply been using silver alginate. Under Kerlix Coban wraps. 02/28/16; this is a patient I'm currently seeing monthly. She is gone on to have a skin graft over her large venous insufficiency ulcer on the left medial ankle. This was done by Dr. Marla Roe him. The patient is a bit perturbed about why she didn't have one on her right medial ankle wound. She has been using silver alginate to this. 03/06/16; I received a phone call from her plastic surgery Dr. Marla Roe. She expressed some concern about the viability of the skin graft she did on the left medial ankle wound. Asked me to place Endoform on this. She told me she is not planning to do a subsequent skin graft on the right as the left one did not take very  well. I had placed Hydrofera Blue on the right 03/13/16; continue to have a reasonably healthy wound on the right medial ankle. Down to 3 mm in terms of size. There is epithelialization here. The area on the left medial ankle is her skin graft site. I suppose the last week this looks somewhat better. She has an open area inferiorly however in the center there appears to be some viable tissue. There is a lot of surface callus and eschar that will eventually need to come off however none of this looked to be infected. Patient states that the is able to keep the dressing on for several days which is an improvement. 03/20/16 no major change in the circumference of either wound however on the left side the patient was at Dr. Eusebio Friendly office and they did a debridement of left wound. 50% of the wound seems to be epithelialized. I been using Endoform on the left Hydrofera Blue in the right 03/27/16; she arrives today  with her wound is not looking as healthy as they did last week. The area on the right clearly has an adherent surface to this a very similar surface on the left. Unfortunately for this patient this is all too familiar problem. Clearly the Endoform is not working and will need to change that today that has some potential to help this surface. She does not tolerate debridement in this clinic very well. She is changing the dressing wants 04/03/16; patient arrives with the wounds looking somewhat better especially on the right. Dr. Migdalia Dk change the dressing to silver alginate when she saw her on Monday and also sold her some compression socks. The usefulness of the latter is really not clear and woman with severely draining wounds. 04/10/16; the patient is doing a bit of an experiment wearing the compression stockings that Dr. Migdalia Dk provided her to her left leg and the out of legs based dressings that we provided to the right. 05/01/16; the patient is continuing to wear compression stockings Dr.  Migdalia Dk provided her on the left that are apparently silver impregnated. She has been using Iodoflex to the right leg wound. Still a moderate amount of drainage, when she leaves here the wraps only last for 4 days. She has to change the stocking on the left leg every night 05/15/16; she is now using compression stockings bilaterally provided by Dr. Marla Roe. She is wearing a nonadherent layer over the wounds so really I don't think there is anything specific being done to this now. She has some reduction on the left wound. The right is stable. I think all healing here is being done without a specific dressing 06/09/16; patient arrives here today with not much change in the wound certainly in diameter to large circular wounds over the medial aspect of her ankle bilaterally. Under the light of these services are certainly not viable for healing. There is no evidence of surrounding infection. She is wearing compression stockings with some sort of silver impregnation as prescribed by Dr. Marla Roe. She has a follow-up with her tomorrow. 06/30/16; no major change in the size or condition of her wounds. These are still probably covered with a nonviable surface. She is using only her purchase stockings. She did see Dr. Marla Roe who seemed to want to apply Dakin's solution to this I'm not extreme short what value this would be. I would suggest Iodoflex which she still has at home. 07/28/16; I follow Mrs. Drath episodically along with Dr. Marla Roe. 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 She has been applying a topical collagen ointment to both wounds with Adaptic. I don't think Dr. Marla Roe is planning to take her back to the OR. 08/19/16; I follow Mrs. Jeneen Rinks on a monthly basis along with Dr. Marla Roe of plastic surgery. 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 I been  following her for a number of years. At one point I was able to get the right medial malleolus wound to heal and had the left medial malleolus down to about half its current size however and I had to send her to plastic surgery for an operative debridement. Since then things have been stable to slightly improve the area on the right is slightly better one in the left about the same although there is much less adherent surface than I'm used to with this patient. She is using some form of liquid collagen gel  that Dr. Marla Roe provided a Kerlix cover with the patient's own pressure stockings. She tells me that she has extreme pain in both ankles and along the lateral aspect of both feet. She has been unable to work for some period of time. She is telling me she is retiring at the beginning of April. She sees Dr. Doran Durand of orthopedics next week 09/22/16; patient has not seen Dr. Marla Roe since the last time she is here. I'm not really sure what she is using to the wounds other than bits and pieces of think she had left over including most recently Hydrofera Blue. She is using juxtalite stockings. She is having difficulty with her husband's recent illness "stroke". She is having to transport him to various doctors appointments. Dr. Marla Roe left her the option of a repeat debridement with ACEL however she has not been able to get the time to follow-up on this. She continues to have a fair amount of drainage out of these wounds with certainly precludes leaving dressings on all week 10/13/16; patient has not seen Dr. Marla Roe since she was last in our clinic. I'm not really sure what she is doing with the wounds, we did try to get her [facility] and I think she is actually using this most of the time. Because of drainage she states she has to change this every second day although this is an improvement from what she used to do. She went to see Dr. Doran Durand who did not think she had a muscular issue  with regards to her feet, he referred her to a neurologist and I think the appointment is sometime in June. I changed her back to Iodoflex which she has used in the past but not recently. 11/03/16; the patient has been using Iodoflex although she ran out of this. Still claims that there is a lot of drainage although the wound does not look like this. No surrounding erythema. She has not been back to see Dr. Marla Roe 11/24/16; the patient has been using Iodoflex again but she ran out of it 2 or 3 days ago. There is no major change in the condition of either one of these wounds in fact they are larger and covered in a thick adherent surface slough/nonviable tissue especially on the left. She does not tolerate mechanical debridement in our clinic. Going back to see Dr. Marla Roe of plastic surgery for an operative debridement would seem reasonable. 12/15/16; the patient has not been back to see Dr. Marla Roe. She is been dealing with a series of illnesses and her husband which of monopolized her time. She is been using Sorbact which we largely supplied. She states the drainage is bad enough that it maximum she can go 2-3 days without changing the dressing 01/12/2017 -- the patient has not been back for about 4 weeks and has not seen Dr. Marla Roe not does she have any appointment pending. 01/23/17; patient has not seen Dr. Marla Roe even though I suggested this previously. She is using Santyl that was suggested last week by Dr. Con Memos this Cost her $16 through her insurance which is indeed surprising 02/12/17; continuing Santyl and the patient is changing this daily. A lot of drainage. She has not been back to see plastic surgery she is using an Ace wrap. Our intake nurse suggested wrap around stockings which would make a good reasonable alternative 02/26/17; patient is been using Santyl and changing this daily due to drainage. She has not been to see plastic surgery she uses in April  Ace wrap to control  the edema. She did obtain extremitease stockings but stated that the edema in her leg was to big for these 03/20/17; patient is using Santyl and Anasept. Surfaces looked better today the area on the right is actually measuring a little smaller. She has states she has a lot of pain in her feet and ankles and is asking for a consult to pain control which I'll try to help her with through our case manager. 04/10/17; the patient arrives with better-looking wound surfaces and is slightly smaller wound on the left she is using a combination of Santyl and Anasept. She has an appointment or at least as started in the pain control center associated with [facility] 05/14/17; this is a patient who I followed for a prolonged period of time. 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 At one point I had this down to a much smaller wound on the left however these reopened and we've never been able to get these to heal. She has been using Santyl and Anasept gel although 2 weeks ago she ran out of the Anasept gel. She has a stable appearance of the wound. She is going to the wound care clinic at [facility] They wanted do a nerve block/spinal block although she tells me she is reluctant to go forward with that. 05/21/17; this is a patient I have followed for many years. 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 Chronic pain and deformity in her ankles as well. She is been to see plastic surgery as well as orthopedics. Using PolyMem AG most recently/Kerramax/ABDs and 2 layer compression. She has managed to keep this on and she is coming in for a nurse check to change the dressing on Tuesdays, we see her on Fridays 06/05/17; really quite a good looking surface and the area especially on the right medial has contracted in terms of dimensions. Well granulated healthy-looking tissue on both sides. Even with an open curet there is nothing that even feels abnormal  here. This is as good as I've seen this in quite some time. We have been using PolyMem AG and bringing her in for a nurse check 06/12/17; really quite good surface on both of these wounds. The right medial has contracted a bit left is not. We've been using PolyMem and AG and she is coming in for a nurse visit 06/19/17; we have been using PolyMem AG and bringing her in for a nurse check. Dimensions of her wounds are not better but the surfaces looked better bilaterally. She complained of bleeding last night and the left wound and increasing pain bilaterally. She states her wound pain is more neuropathic than just the wounds. There was some suggestion that this was radicular from her pain management doctor in talking to her it is really difficult to sort this out. 06/26/17; using PolyMem and AG and bringing her in for a nurse check as All of this and reasonably stable condition. Certainly not improved. The dimensions on the lateral part of the right leg look better but not really measuring better. The medial aspect on the left is about the same. 07/03/16; we have been using PolyMen AG and bringing her in for a nurse check to change the dressings as the wounds have drainage which precludes once weekly changing. We are using all secondary absorptive dressings.our intake nurse is brought up the idea of using a wound VAC/snap VAC on the wound to help with the drainage  to see if this would result in some contraction. This is not a bad idea. The area on the right medial is actually looking smaller. Both wounds have a reasonable-looking surface. There is no evidence of cellulitis. The edema is well controlled 07/10/17; the patient was denied for a snap VAC by her insurance. The major issue with these wounds continues to be drainage. We are using wicked PolyMem AG and she is coming in for a nurse visit to change this. The wounds are stable to slightly improved. The surface looks vibrant and the area on the  right certainly has shrunk in size but very slowly 07/17/17; the patient still has large wounds on her bilateral medial malleoli. Surface of both of these wounds looks better. The dimensions seem to come and go but no consistent improvement. There is no epithelialization. We do not have options for advanced treatment products due to insurance issues. They did not approve of the wound VAC to help control the drainage. More recently we've been using PolyMem and AG wicked to allow drainage through. We have been bringing her in for a nurse visit to change this. We do not have a lot of options for wound care products and the home again due to insurance issues 07/24/17; the patient's wound actually looks somewhat better today. No drainage measurements are smaller still healthy-looking surface. We used silver collagen under PolyMen started last week. We have been bringing her in for a dressing change 07/31/17; patient's wound surface continued to look better and I think there is visible change in the dimensions of the wound on the right. Rims of epithelialization. We have been using silver collagen under PolyMen and bringing her in for a dressing change. There appears to be less drainage although she is still in need of the dressing change 08/07/17. Patient's wound surface continues to look better on both sides and the area on the right is definitely smaller. We have been using silver collagen and PolyMen. She feels that the drainage has been it has been better. I asked her about her vascular status. She went to see Dr. Aleda Grana at [facility] and had some form of ablation. I don't have much detail on this. I haven't my notes from 2016 that she was not a candidate for any further ablation but I don't have any more information on this. We had referred her to vein and vascular I don't think she ever went. He does not have a history of PAD although I don't have any information on this either. We don't even have  ABIs in our record 08/14/17; we've been using silver collagen and PolyMen cover. And putting the patient and compression. She we are bringing her in as a nurse visit to change this because ofarge amount of drainage. We didn't the ABIs in clinic today since they had been done in many moons 1.2 bilaterally. She has been to see vein and vascular however this was at [facility] and she had ablation although I really don't have any information on this all seemed biking get a report. She is also been operatively debrided by plastic surgery and had a cell placed probably 8-12 months ago. This didn't have a major effect. We've been making some gains with current dressings 08/19/17-She is here in follow-up evaluation for bilateral medial malleoli ulcers. She continues to tolerate debridement very poorly. We will continue with recently changed topical treatment; if no significant improvement may consider switching to Iodosorb/Iodoflex. 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 She has been using silver collagen and PolyMem. I believe she has been used and tried on Iodoflex before. 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 These have been very refractory over time. We have been using silver collagen and PolyMen. She is coming in once a week for a doctor's and once a week for nurse visits. We are actually making some progress 09/18/17; the patient's wounds are smaller especially on the right medial. She arrives today to upset to consider even washing these off with Anasept which I think is been part of the reason this is been closing. We've been using collagen covered in PolyMen otherwise. It is noted that she has a small area of folliculitis on the right medial calf that.  As we are wrapping her legs I'll give her a short course of doxycycline to make sure this doesn't amount to anything. She is a long list of complaints today including imbalance, shortness of breath on exertion, inversion of her left ankle. With regards to the latter complaints she is been to see orthopedics and they offered her a tendon release surgery I believe but wanted her wounds to be closed first. I have recommended she go see her primary physician with regards to everything else. 09/25/17; patient's wounds are about the same size. We have made some progress bilaterally although not in recent weeks. She will not allow me T wash these wounds with Anasept even if she is doing her cell. Wheeze we've been using collagen covered in PolyMen. Last week she had a small area of folliculitis this is now opened into a small wound. She completed 5 days of trimethoprim sulfamethoxazole 10/02/17; unfortunately the area on her left medial ankle is worse with a larger wound area towards the Achilles. The patient complains of a lot of pain. She will not allow debridement although visually I don't think there is anything to debridement in any case. We have been using silver collagen and PolyMen for several months now. Initially we are making some progress although I'm not really seeing that today. We will move back to [facility]. His admittedly this is a bit of a repeat however I'm hoping that his situation is different now. The patient tells me she had her leg on the left give out on her yesterday this is process some pain. 10/09/17; the patient is seen twice a week largely because of drainage issues coming out of the chronic medial bimalleolar wounds that are chronic. Last week the dimensions of the one on the left looks a little larger I changed her to [facility]. She comes in today with a history of terrible pain in the bilateral wound areas. She will not allow debridement. She will not even allow a tissue  culture. There is no surrounding erythema no no evidence of cellulitis. We have been putting her Kerlix Coban man. She will not allow more aggressive compression as there was a suggestion to put her in 3 layer wraps. 10/16/17; large wounds on her bilateral medial malleoli. These are chronic. Not much change from last week. The surface looks have healthy but absolutely no epithelialization. A lot of pain little less so of drainage. She will not allow debridement or even washing these off in the vigorous fashion with Anasept. 10/23/17; large wounds  on her bilateral malleoli which are chronic. Some improvement in terms of size perhaps on the right since last time I saw these. She states that after we increased the 3 layer compression there was some bleeding, when she came in for a nurse visit she did not want 3 layer compression put back on about our nurse managed to convince her. She has known chronic venous visit issues and I'm hoping to get her to tolerate the 3 layer compression. using Hydrofera Blue 10/30/17; absolutely no change in the condition of either wound although we've had some improvement in dimensions on the right.. Attempted to put her in 3 layer compression she didn't tolerated she is back in 2 layer compression. We've been using Hydrofera Blue We looked over her past records. She had venous reflux studies in November 2016. There was no evidence of deep venous reflux on the right. Superficial vein did not show the greater saphenous vein at think this is been previously ablated the small saphenous vein was within normal limits. The left deep venous system showed no DVT the vessels were positive for deep venous reflux in the posterior tibial veins at the ankle. The greater saphenous vein was surgically absent small saphenous vein was within normal limits. She went to vein and vascular at [facility]. I believe she had an ablation on the left greater saphenous vein. I'll update her reflux  studies perhaps ever reviewed by vein and vascular. We've made absolutely no progress in these wounds. Will also try to read and TheraSkins through her insurance 11/06/17; W the patient apparently has a 2 week follow-up with vein and vascular I like him to review the whole issue with regards to her previous vascular workup by Dr. Aleda Grana. We've really made no progress on these wounds in many months. She arrives today with less viable looking surface on the left medial ankle wound. This was apparently looking about the same on Tuesday when she was here for nurse visit. 11/13/17; deep tissue culture I did last time of the left lower leg showed multiple organisms without any predominating. In particular no Staphylococcus or group A strep were isolated. We sent her for venous reflux studies. She's had a previous left greater saphenous vein stripping and I think sclerotherapy of the right greater saphenous vein. She didn't really look at the lesser saphenous vein this both wounds are on the medial aspect. She has reflux in the common femoral vein and popliteal vein and an accessory vein on the right and the common femoral vein and popliteal vein on the left. I'm going to have her go to see vein and vascular just the look over things and see if anything else beside aggressive compression is indicated here. We have not been able to make any progress on these wounds in spite of the fact that the surface of the wounds is never look too bad. 11/20/17; no major change in the condition of the wounds. Patient reports a large amount of drainage. She has a lot of complaints of pain although enlisting her today I wonder if some of this at least his neuropathic rather than secondary to her wounds. She has an appointment with vein and vascular on 12/30/17. The refractory nature of these wounds in my mind at least need vein and vascular to look over the wounds the recent reflux studies we did and her history to see if  anything further can be done here. I also note her gait is deteriorated quite a bit. Looks  like she has inversion of her foot on the right. She has a bilateral Trendelenburg gait. I wonder if this is neuropathic or perhaps multilevel radicular. 11/27/17; her wounds actually looks slightly better. Healthy-looking granulation tissue a scant amount of epithelialization. [place] healthcare will not pay for [employer]. They will play for tri layer Oasis and Dermagraft. This is not a diabetic ulcer. We'll try for the tri layer Oasis. She still complains of some drainage. She has a vein and vascular appointment on 12/30/17 12/04/17; the wounds visually look quite good. Healthy-looking granulation with some degree of epithelialization. We are still waiting for response to our request for trial to try layer Oasis. Her appointment with vascular to review venous and arterial issues isn't sold the end of July 7/31. Not allow debridement or even vigorous cleansing of the wound surface. 12/18/17; slightly smaller especially on the right. 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 We've been using Hydrofera Blue. We still are looking into triple layer Oasis through her insurance 01/08/18 on evaluation today patient's wound actually appears to be showing signs of good improvement at this point in time. She has been tolerating the dressing changes without complication. Fortunately there does not appear to be any evidence of infection at this point in time. We have been utilizing silver nitrate which does seem to be of benefit for her which is also good news. Overall I'm very happy with how things seem to be both regards appearance as well as measurement. Patient did see Dr. Bridgett Larsson for evaluation on 12/30/17. In his assessment he felt that stripping would not likely add much more than chronic compression to the patient's healing process. His recommendation was to follow-up in three months with Dr. Doren Custard  if she hasn't healed in order to consider referral back to you and see vascular where she previously was in a trial and was able to get her wound to heal. I'll be see what she feels she when you staying compression and he reiterated this as well. 01/13/18 on evaluation today patient appears to actually be doing very well in regard to her bilateral medial malleolus ulcers. She seems to have tolerated the chemical cauterization with silver nitrate last week she did have some pain through that evening but fortunately states that I'll be see since it seems to be doing better she is overall pleased with the progress. 01/21/18; really quite a remarkable improvement since I've last seen these wounds. We started using silver nitrate specially on the islands of hyper granulation which for some reason her around the wound circumference. This is really done quite nicely. Primary dressing Hydrofera Blue under 4 layer compression. She seems to be able to hold out without a nurse rewrap. Follow-up in 1 week 01/28/18; we've continued the hydrofera blue but continued with chemical cauterization to the wound area that we started about a month ago for irregular hyper granulation. She is made almost stunning improvement in the overall wound dimensions. I was not really expecting this degree of improvement in these chronic wounds 02/05/18; we continue with Hydrofera Bluebut of also continued the aggressive chemical cauterization with silver nitrate. We made nice progress with the right greater than left wound. 02/12/18. We continued with Hydrofera Blue after aggressive chemical cauterization with silver nitrate. We appear to be making nice progress with both wound areas 02/19/2018; we continue with [facility] after washing the wounds vigorously with Anasept spray and chemical cauterization with silver nitrate. We are making excellent progress. The area on the  right's just about closed 02/26/2018. The area on the left  medial ankle had too much necrotic debris today. I used a #5 curette we are able to get most of the soft. I continued with the silver nitrate to the much smaller wound on the right medial ankle she had a new area on her right lower pretibial area which she says was due to a role in her compression 03/05/2018; both wound areas look healthy. Not much change in dimensions from last week. I continue to use silver nitrate and Hydrofera Blue. The patient saw Dr. Doren Custard of vein and vascular. He felt she had venous stasis ulcers. He felt based on her previous arteriogram she should have adequate circulation for healing. Also she has deep venous reflux but really no significant correctable superficial venous reflux at this time. He felt we should continue with conservative management including leg elevation and compression 04/02/2018; since we last saw this woman about a month ago she had a fall apparently suffered a pelvic fracture. I did not look up the x-ray. Nevertheless because of pain she literally was bedbound for 2 weeks and had home health coming out to change the dressing. Somewhat predictably this is resulted in considerable improvement in both wound areas. The right is just about closed on the medial malleolus and the left is about half the size. 04/16/2018; both her wounds continue to go down in size. Using Hydrofera Blue. 05/07/18; both her wounds appeared to be improving especially on the right where it is almost closed. We are using Hydrofera Blue 05/14/2018; slightly worse this week with larger wounds. Surface on the left medial not quite as good. We have been using Hydrofera Blue 05/21/18; again the wounds are slightly larger. Left medial malleolus slightly larger with eschar around the circumference. We have been using Hydrofera Blue undergoing a wraps for a prolonged period of time. This got a lot better when she was more recumbent due to a fall and a back injury. I change the primary dressing  the silver alginate today. She did not tolerate a 4 layer compression previously although I may need to bring this up with her next time 05/28/2018; area on the left medial malleolus again is slightly larger with more drainage. Area on the right is roughly unchanged. She has a small area of folliculitis on the right medial just on the lower calf. This does not look ominous. 06/03/2018 left medial malleolus slightly smaller in a better looking surface. We used silver nitrate on this last time with silver alginate. The area on the right appears slightly smaller 1/10; left medial malleolus slightly smaller. Small open area on the right. We used silver nitrate and silver alginate as of 2 weeks ago. We continue with the wound and compression. These got a lot better when she was off her feet 1/17; right medial malleolus wound is smaller. The left may be slightly smaller. Both surfaces look somewhat better. 1/24; both wounds are slightly smaller. Using silver alginate under Unna boots 1/31; both wounds appear smaller in fact the area on the right medial is just about closed. Surface eschar. We have been using silver alginate under Unna boots. The patient is less active now spends let much less time on her feet and I think this is contributed to the general improvement in the wound condition 2/7; both wounds appear smaller. I was hopeful the right medial would be closed however there there is still the same small open area. Slight amount of surface eschar  on the left the dimensions are smaller there is eschar but the wound edges appear to be free. We have been using silver alginate under Unna boot's 2/14; both wounds once again measure smaller. Circumferential eschar on the left medial. We have been using silver alginate under Unna boots with gradual improvement 2/21; the area on the right medial malleolus has healed. The area on the left is smaller. We have been using silver alginate and Unna boots. We can  discharge wrapping the right leg she has 20/30 stockings at home she will need to protect the scar tissue in this area 2/28; the area on the right medial malleolus remains closed the patient has a compression stocking. The area on the left is smaller. We have been using silver alginate and Unna boots. 3/6 the area on the right medial ankle remains closed. Good edema control noted she is using her own compression stocking. The area on the left medial ankle is smaller. We have been managing this with silver alginate and Unna boots which we will continue today. 3/13; the area on the right medial ankle remains closed and I'm declaring it healed today. When necessary the left is about the same still a healthy-looking surface but no major change and wound area. No evidence of infection and using silver alginate under unna and generally making considerable improvement 3/27 the area on the right medial ankle remains closed the area on the left is about the same as last week. Certainly not any worse we have been using silver alginate under an Unna boot 4/3; the area on the right medial ankle remains closed per the patient. We did not look at this wound. The wound on the left medial ankle is about the same surface looks healthy we have been using silver alginate under an Unna boot 4/10; area on the right medial ankle remains closed per the patient. We did not look at this wound. The wound on the left medial ankle is slightly larger. The patient complains that the [facility] caused burning pain all week. She also told us that she was a lot more active this week. Changed her back to silver alginate 4/17; right medial ankle still closed per the patient. Left medial ankle is slightly larger. Using silver alginate. She did not tolerate Hydrofera Blue on this area 4/24; right medial ankle remains closed we have not look at this. The left medial ankle continues to get larger today by about a centimeter. We have  been using silver alginate under Unna boots. She complains about 4 layer compression as an alternative. She has been up on her feet working on her garden 5/8; right medial ankle remains closed we did not look at this. The left medial ankle has increased in size about 100%. We have been using silver alginate under Unna boots. She noted increased pain this week and was not surprised that the wound is deteriorated 5/15; no major change in SA however much less erythema ( one week of doxy ocellulitis). 5/22-65 year old female returns at 1 week to the clinic for left medial ankle wound for which we have been using silver alginate under 3 layer compression She was placed on DOXY at last visit - the wound is wider at this visit. She is in 3 layer compression 5/29; change to [facility] last week. I had given her empiric doxycycline 2 weeks ago for a week. She is in 3 layer compression. She complains of a lot of pain and drainage on presentation today.  6/5; using Hydrofera Blue. I gave her doxycycline recently empirically for erythema and pain around the wound. Believe her cultures showed enterococcus which not would not have been well covered by doxycycline nevertheless the wound looks better and I don't feel specifically that the enterococcus needs to be covered. She has a new what looks like a wrap injury on her lateral left ankle. 6/12; she is using Hydrofera Blue. She has a new area on the left anterior lower tibial area. This was a wrap injury last week. 6/19; the patient is using Hydrofera Blue. She arrived with marked inflammation and erythema around the wound and tenderness. 12/01/18 on evaluation today patient appears to be doing a little bit better based on what I'm hearing from the standpoint of lassos evaluation to this as far as the overall appearance of the wound is concerned. Then sometime substandard she typically sees Dr. Dellia Nims. Nonetheless overall very pleased with the progress that she's  made up to this point. No fevers, chills, nausea, or vomiting noted at this time. 7/10; some improvement in the surface area. Aggressively debrided last week apparently. I went ahead with the debridement today although the patient does not tolerate this very well. We have been using Iodoflex. Still a fair amount of drainage 7/17; slightly smaller. Using Iodoflex. 7/24; no change from last week in terms of surface area. We have been using Iodoflex. Surface looks and continues to look somewhat better 7/31; surface area slightly smaller better looking surface. We have been using Iodoflex. This is under Unna boot compression 8/7-Patient presents at 1 week with Unna boot and Iodoflex, wound appears better 8/14-Patient presents at 1 week with Iodoflex, we use the Unna boot, wound appears to be stable better.Patient is getting Botox treatment for the inversion of the foot for tendon release, Next week 8/21; we are using Iodoflex. Unna boot. The wound is stable in terms of surface area. Under illumination there is some areas of the wound that appear to be either epithelialized or perhaps this is adherent slough at this point I was not really clear. It did not wipe off and I was reluctant to debride this today. 8/28; we are using Iodoflex in an Unna boot. Seems to be making good improvement. 9/4; using Iodoflex and wound is slightly smaller. 9/18; we are using Iodoflex with topical silver nitrate when she is here. The wound continues to be smaller 10/2; patient missed her appointment last week due to GI issues. She left and Iodoflex based dressing on for 2 weeks. Wound is about the same size about the size of a dime on the left medial lower 10/9 we have been using Iodoflex on the medial left ankle wound. She has a new superficial probable wrap injury on the dorsal left ankle 10/16; we have been using Hydrofera Blue since last week. This is on the left medial ankle 10/23; we have been using Hydrofera Blue  since 2 weeks ago. This is on the left medial ankle. Dimensions are better 11/6; using Hydrofera Blue. I think the wound is smaller but still not closed. Left medial ankle 11/13; we have been using Hydrofera Blue. Wound is certainly no smaller this week. Also the surface not as good. This is the remanent of a very large area on her left medial ankle. 11/20; using Sorbact since last week. Wound was about the same in terms of size although I was disappointed about the surface debris 12/11; 3-week follow-up. Patient was on vacation. Wound is measuring slightly larger we  have been using Sorbact. 12/18; wound is about the same size however surface looks better last week after debridement. We have been using Sorbact under compression 1/15 wound is probably twice the size of last time increased in length nonviable surface. We have been using Sorbact. She was running a mild fever and missed her appointment last week 1/22; the wound is come down in size but under illumination still a very adherent debris we have been Hydrofera Blue that I changed her to last week 1/29; dimensions down slightly. We have been using Hydrofera Blue 2/19 dimensions are the same however there is rims of epithelialization under illumination. Therefore more the surface area may be epithelialized 2/26; the patient's wound actually measures smaller. The wound looks healthy. We have been using Hydrofera Blue. I had some thoughts about running Apligraf then I still may do that however this looks so much better this week we will delay that for now 3/5; the wound is small but about the same as last week. We have been using Hydrofera Blue. No debridement is required today. 3/19; the wound is about the size of a dime. Healthy looking wound even under illumination. We have been using Hydrofera Blue. No mechanical debridement is necessary 3/26; not much change from last week although still looks very healthy. We have been using Hydrofera Blue  under Unna boots Patient was offered an ankle fusion by podiatry but not until the wound heals with a proceed with this. 4/9; the patient comes in today with her original wound on the medial ankle looking satisfactory however she has some uncontrolled swelling in the middle part of her leg with 2 new open areas superiorly just lateral to the tibia. I think this was probably a wrap issue. She said she felt uncomfortable during the week but did not call in. We have been using Hydrofera Blue 4/16; the wound on the medial ankle is about the same. She has innumerable small areas superior to this across her mid tibia. I think this is probably folliculitis. She is also been working in the yard doing a lot of sweating 4/30; the patient issue on the upper areas across her mid tibia of all healed. I think this was excessive yard work if I remember. Her wound on the medial ankle is smaller. Some debris on this we have been using Hydrofera Blue under Unna boots 5/7; mid tibia. She has been using Hydrofera Blue under an Unna wrap. She is apparently going for her ankle surgery on June 3 10/28/19-Patient returns to clinic with the ankle wound, we are using Hydrofera Blue under Unna wrap, surgery is scheduled for her left foot for June 3 so she will be back for nurse visit next week READMISSION 01/17/2020 [name] is a 65 year old woman we have had in this clinic for a long period of time with severe venous hypertension and refractory wounds on her medial lower legs and ankles bilaterally. 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 When she was off her legs for a prolonged period example when she fell and suffered a compression fracture things would heal up quite nicely. She is now retired and we managed to heal up the right medial leg wound. The left one was very tiny last time I saw  this although still refractory. 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 It got to the point where this was interfering with her walking and she elected to proceed with a ankle arthrodesis to straighten her her ankle and leave her with a functional outcome for mobilization. The patient was referred to Dr. Doren Custard and really this took some time to arrange. Dr. Doren Custard saw her on 12/07/2019. Once again he verified that she had no arterial issues. She had previously had an angiogram several years ago. Follow-up ABIs on the left showed an ABI of 1.12 with triphasic waveforms and a TBI of 0.92. She is felt to have chronic deep venous insufficiency but I do not think it was felt that anything could be done from about this from an ablation point of view. At the time Dr. Doren Custard saw this patient the wounds actually look closed via the pictures in his clinic. The patient finally underwent her surgery on 12/15/2019. This went reasonably well and there was a good anatomic outcome. She developed a small distal wound dehiscence on the lateral part of the surgical wound. However more problematically she is developed recurrence of the wound on the medial left ankle. There are actually 2 wounds here one in the distal lower leg and 1 pretty much at the level of the medial malleolus. It is a more distal area that is more problematic. She has been using Hydrofera Blue which started on Friday before this she was simply Ace wrapping. There was a culture done that showed Pseudomonas and she is on ciprofloxacin. A recent CNS on 8/11 was negative. The patient reports some pain but I generally think this is improving. She is using a cam boot completely nonweightbearing using a walker for pivot transfers and a wheelchair 8/24; not much improvement unfortunately she has a surgical wound on the lateral part in the venous insufficiency wound  medially. The bottom part of the medial insufficiency wound is still necrotic there is exposed tendon here. We have been using Hydrofera Blue under compression. Her edema control is however better 8/31; patient in for follow-up of his surgical wound on the lateral part of her left leg and chronic venous insufficiency ulcers medially. We put her back in compression last week. She comes in today with a complaint of 3 or 4 days worth of increasing pain. She felt her cam walker was rubbing on the area on the back of her heel. However there is intense erythema seems more likely she has cellulitis. She had 2 cultures done when she was seeing podiatry in the postop. One of them in late July showed Pseudomonas and she received a course of ciprofloxacin the other was negative on 8/11 she is allergic to penicillin with anaphylactoid complaints of hives oral swelling via information in epic 9/9; when I saw this patient last week she had intense anterior erythema around her wound on the right lateral heel and ankle and also into the right medial heel. Some of this was no doubt drainage and her walker boot however I was convinced she had cellulitis. I gave her Levaquin and Bactrim she is finishing up on this now. She is following up with Dr. [name] he saw her yesterday. He is taken her out of the walking boot of course she is still nonweightbearing. Her x-ray was negative for any worrisome features such as soft tissue air etc. Things are a lot better this week. She has home health. We have been using Hydrofera Blue under an [employer] which she put  back on yesterday. I did not wrap her last week 9/17; her surrounding skin looks a lot better. In fact the area on the left lateral ankle has just a scant amount of eschar. The only remaining wound is the large area on the left medial ankle. Probably about 60% of this is healthy granulation at the surface however she has a significant divot distally. This has  adherent debris in it. I been using debridement and silver collagen to try and get this area to fill-in although I do not think we have made much progress this week 9/24; the patient's wound on the left medial ankle looks a lot better. The deeper divot area distally still requires debridement but this is cleaning up quite nicely we have been using silver collagen. The patient is complaining of swelling in her foot and is worried that that is contributing to the nonhealing of the ankle wound. She is also complaining of numbness in her anterior toes 10/4; left medial ankle. The small area distally still has a divot with necrotic material that I have been debriding away. This has an undermining area. She is approved for Apligraf. She saw Dr. [name] her surgeon on 10/1. I think he declared himself is satisfied with the condition of things. Still nonweightbearing till the end of the month. 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 Her surgical wound is well closed. There is no evidence of infection 10/11; the patient arrived in clinic today with the expectation that we be able to put an Apligraf on this area after debridement however she arrives with a relatively large amount of green drainage on the dressing. The patient states that this started on Friday. She has not been systemically unwell. 10/19; culture I did last week showed both Enterococcus and Pseudomonas. I think this came in separate parts because I stopped her ciprofloxacin I gave her and prescribed her linezolid however now looking at the final culture result this is Pseudomonas which is resistant to quinolones. She has not yet picked up the linezolid apparently phone issues. We are also trying to get a topical antibiotic out of [place] in [place] they can be applied by home health. She is still having green drainage 10/16; the patient has her topical antibiotic from [facility] in [place]. This  is a compounded gel with vancomycin and ciprofloxacin and gentamicin. We are applying this on the wound bed with silver alginate over the top with Unna boot wraps. She arrives in clinic today with a lot less ominous looking drainage although she is only use this topical preparation once the second time today. She sees Dr. [name] her surgeon on Friday she has home health changing the dressing 11/2; still using her compounded topical antibiotic under silver alginate. Surface is cleaning up there is less drainage. We had an Apligraf for her today and I elected to apply it. A light coating of her antibiotic 04/25/2020 upon evaluation today patient appears to be doing well with regard to her ankle ulcer. There is a little bit of slough noted on the surface of the wound I am can have to perform sharp debridement to clear this away today. With that being said other than that fact overall I feel like she is making progress and we do see some new epithelial growth. There is also some improvement in the depth of the wound and that distal portion. There is little bit of slough there as well. 12/7; 2-week follow-up. Apligraf #3.  Dimensions are smaller. Closing in especially inferiorly. Still some surface debris. Still using the [facility] topical antibiotic but I told her that I don't think this needs to be renewed 12/21; 2-week follow-up. Apligraf #4 dimensions are smaller. Nice improvement 06/05/2020; 2-week follow-up. The patient's wound on the left medial ankle looks really excellent. Nice granulation. Advancing epithelialization no undermining no evidence of infection. We would have to reapply for another Apligraf but with the condition of this wound I did not feel strongly about it. We used Hydrofera Blue under the same degree of compression. She follows up with Dr. [name] her surgeon a week Friday 06/13/2020 upon evaluation today patient appears to be doing excellent in regard to her wound. She has been  tolerating the dressing changes without complication. Fortunately there is no signs of active infection at this time. No fevers, chills, nausea, vomiting, or diarrhea. She was using Hydrofera Blue last week. 06/20/2020 06/20/2020 on evaluation today patient actually appears to be doing excellent in regard to her wound. This is measuring better and looking much better as well. She has been using the collagen that seems to be doing better for her as well even though the [facility] was and is not sticking or feeling as rough on her wound. She did see Dr. [name] on Friday he is very pleased he also stated none of the hardware has shifted. That is great news 1/27; the patient has a small clean wound all that is remaining. I agree that this is too small to really consider an Apligraf. Under illumination the surface is looking quite good. We have been using collagen although the dimensions are not any better this week 2/2; the patient has a small clean wound on the left medial ankle. Although this left of her substantial original areas. Measurements are smaller. We have been using polymen Ag under an Haematologist. 2/10; small area on the left medial ankle. This looks clean nothing to debride however dimensions are about the same we have been using polymen I think now for 2 weeks 2/17; not much change in surface area. We have been using polymen Ag without any improvement. 3/17; 1 month follow-up. The patient has been using endoform without any improvement in fact I think this looks worse with more depth and more expansion 3/24; no improvement. Perhaps less debris on the surface. We have been using Sorbact for 1 week 4/4; wound measures larger. She has edema in her leg and her foot which she tells as her wrap came down. We have been using Unna boots. Sorbact of the wound. She has been approved for Apligraf 09/12/2020 upon evaluation today patient appears to be doing well with regard to her wound. We did get the  Apligraf reapproved which is great news we have that available for application today. Fortunately there is no signs of infection and overall the patient seems to be doing great. The wound bed is nice and clean. 4/27; patient presents for her second application of Apligraf. She states over the past week she has been on her feet more often due to being outside in her garden. She has noted more swelling to her foot as a result. She denies increased warmth, pain or erythema to the wound site. 10/10/2020 upon evaluation today patient appears to be doing well with regard to her wound which does not appear to be quite as irritated as last week from what I am hearing. With that being said unfortunately she is having issues with some erythema  and warmth to touch as well as an increase size. I do believe this likely is infected. 10/17/2020 upon evaluation today patient appears to be doing excellent in regard to her wound this is significantly improved as compared to last week. Fortunately I think that the infection is much better controlled at this point. She did have evidence of both Enterococcus as well as Staphylococcus noted on culture. Enterococcus really would not be helped significantly by the Cipro but the wound is doing so much better I am under the assumption that the Staphylococcus is probably the main organism that is causing the current infection. Nonetheless I think that she is doing excellent as far as that is concerned and I am very pleased in that regard. I would therefore recommend she continue with the Cipro. Electronic Signature(s) Signed: 10/17/2020 12:22:35 PM By: [name] PA-C Entered By: [name] on 10/17/2020 12:22:34 -------------------------------------------------------------------------------- Physical Exam Details Patient Name: Date of Service: [facility] [name], [name] 10/17/2020 9:15 A M Medical Record Number: [id number] Patient Account Number: [id number] Date of  Birth/Sex: Treating RN: [id number] (65 y.o. [name] Provider: [name] Other Clinician: Referring Provider: Treating Provider/Extender: [name] in Treatment: 68 Constitutional Well-nourished and well-hydrated in no acute distress. Respiratory normal breathing without difficulty. Psychiatric this patient is able to make decisions and demonstrates good insight into disease process. Alert and Oriented x 3. pleasant and cooperative. Notes Upon inspection patient's wound bed actually showed signs of good granulation epithelization at this point. There does not appear to be any signs of infection which is great news overall I am extremely pleased with where things stand. I think that she is actually okay to apply the Apligraf I did need to clean weights on the surface of the wound today I performed a light sharp debridement of the surface of the wound which she tolerated without complication. Electronic Signature(s) Signed: 10/17/2020 12:22:56 PM By: [name] PA-C Entered By: [name] on 10/17/2020 12:22:55 -------------------------------------------------------------------------------- Physician Orders Details Patient Name: Date of Service: [facility] [name], [name] 10/17/2020 9:15 A M Medical Record Number: [id number] Patient Account Number: [id number] Date of Birth/Sex: Treating RN: [birth year] (65 y.o. [name] Provider: [name] Other Clinician: Referring Provider: Treating Provider/Extender: [name] in Treatment: 80 Verbal / Phone Orders: No Diagnosis Coding ICD-10 Coding Code Description [phone] Chronic venous hypertension (idiopathic) with ulcer and inflammation of left lower extremity L97.828 Non-pressure chronic ulcer of other part of left lower leg with other specified severity L97.328 Non-pressure chronic ulcer of left ankle with other specified  severity Follow-up Appointments ppointment in 2 weeks. - with Margarita Grizzle Return A Nurse Visit: - dressing change post apligraf Cellular or Tissue Based Products Cellular or Tissue Based Product Type: - apligraf #3 Cellular or Tissue Based Product applied to wound bed, secured with steri-strips, cover with Adaptic or Mepitel. (DO NOT REMOVE). Bathing/ Shower/ Hygiene May shower with protection but do not get wound dressing(s) wet. Edema Control - Lymphedema / SCD / Other Elevate legs to the level of the heart or above for 30 minutes daily and/or when sitting, a frequency of: - throughout the day Avoid standing for long periods of time. Exercise regularly Compression stocking or Garment 20-30 mm/Hg pressure to: - right leg daily Wound Treatment Wound #15 - Malleolus Wound Laterality: Left, Medial Cleanser: Soap and Water 1 x Per Week/30 Days Discharge Instructions: May shower  and wash wound with dial antibacterial soap and water prior to dressing change. Peri-Wound Care: Sween Lotion (Moisturizing lotion) 1 x Per Week/30 Days Discharge Instructions: Apply moisturizing lotion as directed Secondary Dressing: Woven Gauze Sponge, Non-Sterile 4x4 in 1 x Per Week/30 Days Discharge Instructions: Apply over primary dressing as directed. Secondary Dressing: ABD Pad, 5x9 1 x Per Week/30 Days Discharge Instructions: Apply over primary dressing as directed. Compression Wrap: Unnaboot w/Calamine, 4x10 (in/yd) 1 x Per Week/30 Days Discharge Instructions: Apply Unnaboot as directed. Electronic Signature(s) Signed: 10/17/2020 1:22:22 PM By: [name] PA-C Signed: 10/17/2020 6:10:17 PM By: Baruch Gouty RN, BSN Entered By: Baruch Gouty on 10/17/2020 10:18:43 -------------------------------------------------------------------------------- Problem List Details Patient Name: Date of Service: [name], [name] 10/17/2020 9:15 A M Medical Record Number: [id number] Patient Account Number:  [id number] Date of Birth/Sex: Treating RN: 21-May-1956 (65 y.o. [name] Provider: [name] Other Clinician: Referring Provider: Treating Provider/Extender: [name] in Treatment: 39 Active Problems ICD-10 Encounter Encounter Code Description Active Date MDM Diagnosis I87.332 Chronic venous hypertension (idiopathic) with ulcer and inflammation of left 01/17/2020 No Yes lower extremity L97.828 Non-pressure chronic ulcer of other part of left lower leg with other specified 01/17/2020 No Yes severity L97.328 Non-pressure chronic ulcer of left ankle with other specified severity 01/17/2020 No Yes Inactive Problems ICD-10 Code Description Active Date Inactive Date L03.116 Cellulitis of left lower limb 01/31/2020 01/31/2020 T81.31XD Disruption of external operation (surgical) wound, not elsewhere classified, subsequent 01/17/2020 01/17/2020 encounter Resolved Problems Electronic Signature(s) Signed: 10/17/2020 10:06:28 AM By: [name] PA-C Entered By: [name] on 10/17/2020 10:06:27 -------------------------------------------------------------------------------- Progress Note Details Patient Name: Date of Service: [facility] [name], [name] 10/17/2020 9:15 A M Medical Record Number: [id number] Patient Account Number: [id number] Date of Birth/Sex: Treating RN: 1955/11/01 (65 y.o. [name] Provider: [name] Other Clinician: Referring Provider: Treating Provider/Extender: [name] in Treatment: 93 Subjective Chief Complaint Information obtained from Patient patient is been followed long-term in this clinic for venous insufficiency ulcers with inflammation, hypertension and ulceration over the medial ankle bilaterally. 01/17/2020; this is a patient who is here for review of postoperative wounds on the left lateral ankle and recurrence of venous stasis ulceration on the  left medial History of Present Illness (HPI) the remaining wound is over the left medial ankle. Similar wound over the right medial ankle healed largely with use of Apligraf. Most recently we have been using Hydrofera Blue over this wound with considerable improvement. The patient has been extensively worked up in the past for her venous insufficiency and she is not a candidate for antireflux surgery although I have none of the details available currently. 08/24/14; considerable improvement today. About 50% of this wound areas now epithelialized. The base of the wound appears to be healthier granulation.as opposed to last week when she had deteriorated a considerable improvement 08/17/14; unfortunately the wound has regressed somewhat. The areas of epithelialization from the superior aspect are not nearly as healthy as they were last week. The patient thinks her Hydrofera Blue slipped. 09/07/14; unfortunately the area has markedly regressed in the 2 weeks since I've seen this. There is an odor surrounding erythema. The healthy granulation tissue that we had at the base of the wound now is a dusky color. The nurse reports green drainage 09/14/14; the area looks somewhat better than last week. There is less erythema and less drainage. The culture  I did did not show any growth. Nevertheless I think it is better to continue the Cipro and doxycycline for a further week. The remaining wound area was debridement. 09/21/14. Wound did not require debridement last week. Still less erythema and less drainage. She can complete her antibiotics. The areas of epithelialization in the superior aspect of the wound do not look as healthy as they did some weeks ago 10/05/14 continued improvement in the condition of this wound. There is advancing epithelialization. Less aggressive debridement required 10/19/14 continued improvement in the condition and volume of this wound. Less aggressive debridement to the inferior part of  this to remove surface slough and fibrinous eschar 11/02/14 no debridement is required. The surface granulation appears healthy although some of her islands of epithelialization seem to have regressed. No evidence of infection 11/16/14; lites surface debridement done of surface eschar. The wound does not look to be unhealthy. No evidence of infection. Unfortunately the patient has had podiatry issues in the right foot and for some reason has redeveloped small surface ulcerations in the medial right ankle. Her original presentation involved wounds in this area 11/23/14 no debridement. The area on the right ankle has enlarged. The left ankle wound appears stable in terms of the surface although there is periwound inflammation. There has been regression in the amount of new skin 11/30/14 no debridement. Both wound areas appear healthy. There was no evidence of infection. The the new area on the right medial ankle has enlarged although that both the surfaces appear to be stable. 12/07/14; Debridement of the right medial ankle wound. No no debridement was done on the left. 12/14/14 no major change in and now bilateral medial ankle wounds. Both of these are very painful but the no overt evidence of infection. She has had previous venous ablation 12/21/14; patient states that her right medial ankle wound is considerably more painful last week than usual. Her left is also somewhat painful. She could not tolerate debridement. The right medial ankle wound has fibrinous surface eschar 12/28/14 this is a patient with severe bilateral venous insufficiency ulcers. For a considerable period of time we actually had the one on the right medial ankle healed however this recently opened up again in June. The left medial ankle wound has been a refractory area with some absent flows. We had some success with Hydrofera Blue on this area and it literally closed by 50% however it is recently opened up Foley. Both of these were  debridement today of surface eschar. She tolerates this poorly 01/25/15: No change in the status of this. Thick adherent escar. Very poor tolerance of any attempt at debridement. I had healed the right medial malleolus wound for a considerable amount of time and had the left one down to about 50% of the volume although this is totally regressed over the last 48 weeks. Further the right leg has reopened. she is trying to make a appointment with pain and vascular, previous ablations with Dr. Aleda Grana. I do not believe there is an arterial insufficiency issue here 02/01/15 the status of the adherent eschar bilaterally is actually improved. No debridement was done. She did not manage to get vascular studies done 02/08/15 continued debridement of the area was done today. The slough is less adherent and comes off with less pressure. There is no surrounding infection peripheral pulses are intact 02/15/15 selective debridement with a disposable curette. Again the slough is less adherent and comes off with less difficulty. No surrounding infection peripheral pulses are  intact. 02/22/15 selective debridement of the right medial ankle wound. Slough comes off with less difficulty. No obvious surrounding infection peripheral pulses are intact I did not debridement the one on the left. Both of these are stable to improved 03/01/15 selective debridement of both wound areas using a curette to. Adherent slough cup soft with less difficulty. No obvious surrounding infection. The patient tells me that 2 days ago she noted a rash above the right leg wrap. She did not have this on her lower legs when she change this over she arrives with widespread left greater than right almost folliculitis-looking rash which is extremely pruritic. I don't see anything to culture here. There is no rash on the rest of her body. She feels well systemically. 03/08/15; selective debridement of both wounds using a curette. Base of this does not  look unhealthy. She had limegreen drainage coming out of the left leg wound and describes a lot of drainage. The rash on her left leg looks improved to. No cultures were done. 03/22/15; patient was not here last week. Basal wounds does not look healthy and there is no surrounding erythema. No drainage. There is still a rash on the left leg that almost looks vasculitic however it is clearly limited to the top of where the wrap would be. 04/05/15; on the right required a surgical debridement of surface eschar and necrotic subcutaneous tissue. I did not debridement the area on the left. These continue to be large open wounds that are not changing that much. We were successful at one point in healing the area on the right, and at the same time the area on the left was roughly half the size of current measurements. I think a lot of the deterioration has to do with the prolonged time the patient is on her feet at work 04/19/15 I attempted-like surface debridement bilaterally she does not tolerate this. She tells me that she was in allergic care yesterday with extreme pain over her left lateral malleolus/ankle and was told that she has an "sprain" 05/03/15; large bilateral venous insufficiency wounds over the medial malleolus/medial aspect of her ankles. She complains of copious amounts of drainage and his usual large amounts of pain. There is some increasing erythema around the wound on the right extending into the medial aspect of her foot to. historically she came in with these wounds the right one healed and the left one came down to roughly half its current size however the right one is reopened and the left is expanded. 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 05/10/15 large bilateral venous insufficiency wounds. There is less adherence surface left however the surface culture that I did last week grew pseudomonas therefore bilateral selective  debridement score necessary. There is surrounding erythema. The patient describes severe bilateral drainage and a lot of pain in the left ankle. Apparently her podiatrist was were ready to do a cortisone shot 05/17/15; the patient complains of pain and again copious amounts of drainage. 05/24/15; we used Iodo flex last week. Patient notes considerable improvement in wound drainage. Only needed to change this once. 05/31/15; we continued Iodoflex; the base of these large wounds bilaterally is not too bad but there is probably likely a significant bioburden here. I would like to debridement just doesn't tolerate it. 06/06/14 I would like to continue the Iodoflex although she still hasn't managed to obtain supplies. She has bilateral medial malleoli  or large wounds which are mostly superficial. Both of them are covered circumferentially with some nonviable fibrinous slough although she tolerates debridement very poorly. She apparently has an appointment for an ablation on the right leg by interventional radiology. 06/14/15; the patient arrives with the wounds and static condition. We attempted a debridement although she does not do well with this secondary to pain. I 07/05/15; wounds are not much smaller however there appears to be a cleaner granulating base. The left has tight fibrinous slough greater than the right. Debridement is tolerated poorly due to pain. Iodoflex is done more for these wounds in any of the multitude of different dressings I have tried on the left 1 and then subsequently the right. 07/12/15; no change in the condition of this wound. I am able to do an aggressive debridement on the right but not the left. She simply cannot tolerate it. We have been using Iodoflex which helps somewhat. It is worthwhile remembering that at one point we healed the right medial ankle wound and the left was about 25% of the current circumference. We have suggested returning to vascular surgery for review of  possible further ablations for one reason or another she has not been able to do this. 07/26/15 no major change in the condition of either wound on her medial ankle. I did not attempt to debridement of these. She has been aggressively scrubbing these while she is in the shower at home. She has her supply of Iodoflex which seems to have done more for these wounds then anything I have put on recently. 08/09/15 wound area appears larger although not verified by measurements. Using Iodoflex 09/05/2015 -- she was here for avisit today but had significant problems with the wound and I was asked to see her for a physician opinion. I have summarize that this lady has had surgery on her left lower extremity about 10 years ago where the possible veins stripping was done. She has had an opinion from interventional radiology around November 2016 where no further sclerotherapy was ordered. The patient works 12 hours a day and stands on a concrete floor with work boots and is unable to get the proper compression she requires and cannot elevate her limbs appropriately at any given time. She has recently grown Pseudomonas from her wound culture but has not started her ciprofloxacin which was called in for her. 09/13/15 this continues to be a difficult situation for this patient. At one point I had this wound down to a 1.5 x 1.5" wound on her left leg. This is deteriorated and the right leg has reopened. She now has substantial wounds on her medial calcaneus, malleoli and into her lower leg. One on the left has surface eschar but these are far too painful for me to debridement here. She has a vascular surgery appointment next week to see if anything can be done to help here. I think she has had previous ablations several years ago at [facility]. She has no major edema. She tells me that she did not get product last time [facility]] and went for several days without it. She continues to work in work boots 12 hours a  day. She cannot get compression/4-layer under her work boots. 09/20/15 no major change. Periwound edema control was not very good. Her point with pain and vascular is next Wednesday the 25th 09/28/15; the patient is seen vascular surgery and is apparently scheduled for repeat duplex ultrasounds of her bilateral lower legs next week. 10/05/15;  the patient was seen by Dr. Doren Custard of vascular surgery. He feels that she should have arterial insufficiency excluded as cause/contributed to her nonhealing stage she is therefore booked for an arteriogram. She has apparently monophasic signals in the dorsalis pedis pulses. She also of course has known severe chronic venous insufficiency with previous procedures as noted previously. I had another long discussion with the patient today about her continuing to work 12 hour shifts. I've written her out for 2 months area had concerns about this as her work location is currently undergoing significant turmoil and this may lead to her termination. She is aware of this however I agree with her that she simply cannot continue to stand for 12 hours multiple days a week with the substantial wound areas she has. 10/19/15; the Dr. Doren Custard appointment was largely for an arteriogram which was normal. She does not have an arterial issue. He didn't make a comment about her chronic venous insufficiency for which she has had previous ablations. Presumably it was not felt that anything additional could be done. The patient is now out of work as I prescribed 2 weeks ago. Her wounds look somewhat less aggravated presumably because of this. I felt I would give debridement another try today 10/25/15; no major change in this patient's wounds. We are struggling to get her product that she can afford into her own home through her insurance. 11/01/15; no major change in the patient's wounds. I have been using silver alginate as the most affordable product. I spoke to Dr. Marla Roe last week with  her requested take her to the OR for surgical debridement and placement of ACEL. Dr. Marla Roe told me that she would be willing to do this however [facility] will not cover this, fortunately the patient has [place] healthcare of some variant 11/08/15; no major change in the patient's wounds. She has been completely nonviable surface that this but is in too much pain with any attempted debridement are clinic. I have arranged for her to see Dr. Marla Roe ham of plastic surgery and this appointment is on Monday. I am hopeful that they will take her to the OR for debridement, possible ACEL ultimately possible skin graft 11/22/15 no major change in the patient's wounds over her bilateral medial calcaneus medial malleolus into the lower legs. Surface on these does not look too bad however on the left there is surrounding erythema and tenderness. This may be cellulitis or could him sleepy tinea. 11/29/15; no major changes in the patient's wounds over her bilateral medial malleolus. There is no infection here and I don't think any additional antibiotics are necessary. There is now plan to move forward. She sees Dr. Marla Roe in a week's time for preparation for operative debridement and ACEL placement I believe on 7/12. She then has a follow-up appointment with Dr. Marla Roe on 7/21 12/28/15; the patient returns today having been taken to the [place] by Dr. Marla Roe 12/12/15 she underwent debridement, intraoperative cultures [which were negative]. She had placement of a wound VAC. Parent really ACEL was not available to be placed. The wound VAC foam apparently adhered to the wound since then she's been using silver alginate, Xeroform under Ace wraps. She still says there is a lot of drainage and a lot of pain 01/31/16; this is a patient I see monthly. I had referred her to Dr. Marla Roe him of plastic surgery for large wounds on her bilateral medial ankles. She has been to the OR twice once in early  July and once in early August. She tells me over the last 3 weeks she has been using the wound VAC with ACEL underneath it. On the right we've simply been using silver alginate. Under Kerlix Coban wraps. 02/28/16; this is a patient I'm currently seeing monthly. She is gone on to have a skin graft over her large venous insufficiency ulcer on the left medial ankle. This was done by Dr. Marla Roe him. The patient is a bit perturbed about why she didn't have one on her right medial ankle wound. She has been using silver alginate to this. 03/06/16; I received a phone call from her plastic surgery Dr. Marla Roe. She expressed some concern about the viability of the skin graft she did on the left medial ankle wound. Asked me to place Endoform on this. She told me she is not planning to do a subsequent skin graft on the right as the left one did not take very well. I had placed Hydrofera Blue on the right 03/13/16; continue to have a reasonably healthy wound on the right medial ankle. Down to 3 mm in terms of size. There is epithelialization here. The area on the left medial ankle is her skin graft site. I suppose the last week this looks somewhat better. She has an open area inferiorly however in the center there appears to be some viable tissue. There is a lot of surface callus and eschar that will eventually need to come off however none of this looked to be infected. Patient states that the is able to keep the dressing on for several days which is an improvement. 03/20/16 no major change in the circumference of either wound however on the left side the patient was at Dr. Eusebio Friendly office and they did a debridement of left wound. 50% of the wound seems to be epithelialized. I been using Endoform on the left Hydrofera Blue in the right 03/27/16; she arrives today with her wound is not looking as healthy as they did last week. The area on the right clearly has an adherent surface to this a very similar  surface on the left. Unfortunately for this patient this is all too familiar problem. Clearly the Endoform is not working and will need to change that today that has some potential to help this surface. She does not tolerate debridement in this clinic very well. She is changing the dressing wants 04/03/16; patient arrives with the wounds looking somewhat better especially on the right. Dr. Migdalia Dk change the dressing to silver alginate when she saw her on Monday and also sold her some compression socks. The usefulness of the latter is really not clear and woman with severely draining wounds. 04/10/16; the patient is doing a bit of an experiment wearing the compression stockings that Dr. Migdalia Dk provided her to her left leg and the out of legs based dressings that we provided to the right. 05/01/16; the patient is continuing to wear compression stockings Dr. Migdalia Dk provided her on the left that are apparently silver impregnated. She has been using Iodoflex to the right leg wound. Still a moderate amount of drainage, when she leaves here the wraps only last for 4 days. She has to change the stocking on the left leg every night 05/15/16; she is now using compression stockings bilaterally provided by Dr. Marla Roe. She is wearing a nonadherent layer over the wounds so really I don't think there is anything specific being done to this now. She has some reduction on the left wound. The right  is stable. I think all healing here is being done without a specific dressing 06/09/16; patient arrives here today with not much change in the wound certainly in diameter to large circular wounds over the medial aspect of her ankle bilaterally. Under the light of these services are certainly not viable for healing. There is no evidence of surrounding infection. She is wearing compression stockings with some sort of silver impregnation as prescribed by Dr. Marla Roe. She has a follow-up with her tomorrow. 06/30/16; no major  change in the size or condition of her wounds. These are still probably covered with a nonviable surface. She is using only her purchase stockings. She did see Dr. Marla Roe who seemed to want to apply Dakin's solution to this I'm not extreme short what value this would be. I would suggest Iodoflex which she still has at home. 07/28/16; I follow Mrs. Shiplett episodically along with Dr. Marla Roe. 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 She has been applying a topical collagen ointment to both wounds with Adaptic. I don't think Dr. Marla Roe is planning to take her back to the OR. 08/19/16; I follow Mrs. Jeneen Rinks on a monthly basis along with Dr. Marla Roe of plastic surgery. 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 I been following her for a number of years. At one point I was able to get the right medial malleolus wound to heal and had the left medial malleolus down to about half its current size however and I had to send her to plastic surgery for an operative debridement. Since then things have been stable to slightly improve the area on the right is slightly better one in the left about the same although there is much less adherent surface than I'm used to with this patient. She is using some form of liquid collagen gel that Dr. Marla Roe provided a Kerlix cover with the patient's own pressure stockings. She tells me that she has extreme pain in both ankles and along the lateral aspect of both feet. She has been unable to work for some period of time. She is telling me she is retiring at the beginning of April. She sees Dr. Doran Durand of orthopedics next week 09/22/16; patient has not seen Dr. Marla Roe since the last time she is here. I'm not really sure what she is using to the wounds other than bits and pieces of think she had left over including most recently  Hydrofera Blue. She is using juxtalite stockings. She is having difficulty with her husband's recent illness "stroke". She is having to transport him to various doctors appointments. Dr. Marla Roe left her the option of a repeat debridement with ACEL however she has not been able to get the time to follow-up on this. She continues to have a fair amount of drainage out of these wounds with certainly precludes leaving dressings on all week 10/13/16; patient has not seen Dr. Marla Roe since she was last in our clinic. I'm not really sure what she is doing with the wounds, we did try to get her [facility] and I think she is actually using this most of the time. Because of drainage she states she has to change this every second day although this is an improvement from what she used to do. She went to see Dr. Doran Durand who did not think she had a muscular issue with regards to her feet, he referred her to  a neurologist and I think the appointment is sometime in June. I changed her back to Iodoflex which she has used in the past but not recently. 11/03/16; the patient has been using Iodoflex although she ran out of this. Still claims that there is a lot of drainage although the wound does not look like this. No surrounding erythema. She has not been back to see Dr. Marla Roe 11/24/16; the patient has been using Iodoflex again but she ran out of it 2 or 3 days ago. There is no major change in the condition of either one of these wounds in fact they are larger and covered in a thick adherent surface slough/nonviable tissue especially on the left. She does not tolerate mechanical debridement in our clinic. Going back to see Dr. Marla Roe of plastic surgery for an operative debridement would seem reasonable. 12/15/16; the patient has not been back to see Dr. Marla Roe. She is been dealing with a series of illnesses and her husband which of monopolized her time. She is been using Sorbact which we largely  supplied. She states the drainage is bad enough that it maximum she can go 2-3 days without changing the dressing 01/12/2017 -- the patient has not been back for about 4 weeks and has not seen Dr. Marla Roe not does she have any appointment pending. 01/23/17; patient has not seen Dr. Marla Roe even though I suggested this previously. She is using Santyl that was suggested last week by Dr. Con Memos this Cost her $16 through her insurance which is indeed surprising 02/12/17; continuing Santyl and the patient is changing this daily. A lot of drainage. She has not been back to see plastic surgery she is using an Ace wrap. Our intake nurse suggested wrap around stockings which would make a good reasonable alternative 02/26/17; patient is been using Santyl and changing this daily due to drainage. She has not been to see plastic surgery she uses in April Ace wrap to control the edema. She did obtain extremitease stockings but stated that the edema in her leg was to big for these 03/20/17; patient is using Santyl and Anasept. Surfaces looked better today the area on the right is actually measuring a little smaller. She has states she has a lot of pain in her feet and ankles and is asking for a consult to pain control which I'll try to help her with through our case manager. 04/10/17; the patient arrives with better-looking wound surfaces and is slightly smaller wound on the left she is using a combination of Santyl and Anasept. She has an appointment or at least as started in the pain control center associated with  regional 05/14/17; this is a patient who I followed for a prolonged period of time. 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 At one point I had this down to a much smaller wound on the left however these reopened and we've never been able to get these to heal. She has been using Santyl and Anasept gel although 2 weeks ago she ran out of the Anasept gel. She has a  stable appearance of the wound. She is going to the wound care clinic at [facility]. They wanted do a nerve block/spinal block although she tells me she is reluctant to go forward with that. 05/21/17; this is a patient I have followed for many years. 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 Chronic pain and deformity in her ankles as well. She is been to see plastic  surgery as well as orthopedics. Using PolyMem AG most recently/Kerramax/ABDs and 2 layer compression. She has managed to keep this on and she is coming in for a nurse check to change the dressing on Tuesdays, we see her on Fridays 06/05/17; really quite a good looking surface and the area especially on the right medial has contracted in terms of dimensions. Well granulated healthy-looking tissue on both sides. Even with an open curet there is nothing that even feels abnormal here. This is as good as I've seen this in quite some time. We have been using PolyMem AG and bringing her in for a nurse check 06/12/17; really quite good surface on both of these wounds. The right medial has contracted a bit left is not. We've been using PolyMem and AG and she is coming in for a nurse visit 06/19/17; we have been using PolyMem AG and bringing her in for a nurse check. Dimensions of her wounds are not better but the surfaces looked better bilaterally. She complained of bleeding last night and the left wound and increasing pain bilaterally. She states her wound pain is more neuropathic than just the wounds. There was some suggestion that this was radicular from her pain management doctor in talking to her it is really difficult to sort this out. 06/26/17; using PolyMem and AG and bringing her in for a nurse check as All of this and reasonably stable condition. Certainly not improved. The dimensions on the lateral part of the right leg look better but not really measuring better. The medial aspect on the left is about the  same. 07/03/16; we have been using PolyMen AG and bringing her in for a nurse check to change the dressings as the wounds have drainage which precludes once weekly changing. We are using all secondary absorptive dressings.our intake nurse is brought up the idea of using a wound VAC/snap VAC on the wound to help with the drainage to see if this would result in some contraction. This is not a bad idea. The area on the right medial is actually looking smaller. Both wounds have a reasonable-looking surface. There is no evidence of cellulitis. The edema is well controlled 07/10/17; the patient was denied for a snap VAC by her insurance. The major issue with these wounds continues to be drainage. We are using wicked PolyMem AG and she is coming in for a nurse visit to change this. The wounds are stable to slightly improved. The surface looks vibrant and the area on the right certainly has shrunk in size but very slowly 07/17/17; the patient still has large wounds on her bilateral medial malleoli. Surface of both of these wounds looks better. The dimensions seem to come and go but no consistent improvement. There is no epithelialization. We do not have options for advanced treatment products due to insurance issues. They did not approve of the wound VAC to help control the drainage. More recently we've been using PolyMem and AG wicked to allow drainage through. We have been bringing her in for a nurse visit to change this. We do not have a lot of options for wound care products and the home again due to insurance issues 07/24/17; the patient's wound actually looks somewhat better today. No drainage measurements are smaller still healthy-looking surface. We used silver collagen under PolyMen started last week. We have been bringing her in for a dressing change 07/31/17; patient's wound surface continued to look better and I think there is visible change in the dimensions of  the wound on the right. Rims of  epithelialization. We have been using silver collagen under PolyMen and bringing her in for a dressing change. There appears to be less drainage although she is still in need of the dressing change 08/07/17. Patient's wound surface continues to look better on both sides and the area on the right is definitely smaller. We have been using silver collagen and PolyMen. She feels that the drainage has been it has been better. I asked her about her vascular status. She went to see Dr. Aleda Grana at [facility] and had some form of ablation. I don't have much detail on this. I haven't my notes from 2016 that she was not a candidate for any further ablation but I don't have any more information on this. We had referred her to vein and vascular I don't think she ever went. He does not have a history of PAD although I don't have any information on this either. We don't even have ABIs in our record 08/14/17; we've been using silver collagen and PolyMen cover. And putting the patient and compression. She we are bringing her in as a nurse visit to change this because ofarge amount of drainage. We didn't the ABIs in clinic today since they had been done in many moons 1.2 bilaterally. She has been to see vein and vascular however this was at [facility] and she had ablation although I really don't have any information on this all seemed biking get a report. She is also been operatively debrided by plastic surgery and had a cell placed probably 8-12 months ago. This didn't have a major effect. We've been making some gains with current dressings 08/19/17-She is here in follow-up evaluation for bilateral medial malleoli ulcers. She continues to tolerate debridement very poorly. We will continue with recently changed topical treatment; if no significant improvement may consider switching to Iodosorb/Iodoflex. 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 She has been using  silver collagen and PolyMem. I believe she has been used and tried on Iodoflex before. 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 These have been very refractory over time. We have been using silver collagen and PolyMen. She is coming in once a week for a doctor's and once a week for nurse visits. We are actually making some progress 09/18/17; the patient's wounds are smaller especially on the right medial. She arrives today to upset to consider even washing these off with Anasept which I think is been part of the reason this is been closing. We've been using collagen covered in PolyMen otherwise. It is noted that she has a small area of folliculitis on the right medial calf that. As we are wrapping her legs I'll give her a short course of doxycycline to make sure this doesn't amount to anything. She is a long list of complaints today including imbalance, shortness of breath on exertion, inversion of her left ankle. With regards to the latter complaints she is been to see orthopedics and they offered her a tendon release surgery I believe but wanted her wounds to be closed first. I have recommended she go see her primary physician with regards to everything else. 09/25/17; patient's wounds are about the same size. We have made some progress bilaterally although not in recent weeks.  She will not allow me T wash these wounds with Anasept even if she is doing her cell. Wheeze we've been using collagen covered in PolyMen. Last week she had a small area of folliculitis this is now opened into a small wound. She completed 5 days of trimethoprim sulfamethoxazole 10/02/17; unfortunately the area on her left medial ankle is worse with a larger wound area towards the Achilles. The patient complains of a lot of pain. She will  not allow debridement although visually I don't think there is anything to debridement in any case. We have been using silver collagen and PolyMen for several months now. Initially we are making some progress although I'm not really seeing that today. We will move back to [facility]. His admittedly this is a bit of a repeat however I'm hoping that his situation is different now. The patient tells me she had her leg on the left give out on her yesterday this is process some pain. 10/09/17; the patient is seen twice a week largely because of drainage issues coming out of the chronic medial bimalleolar wounds that are chronic. Last week the dimensions of the one on the left looks a little larger I changed her to [facility]. She comes in today with a history of terrible pain in the bilateral wound areas. She will not allow debridement. She will not even allow a tissue culture. There is no surrounding erythema no no evidence of cellulitis. We have been putting her Kerlix Coban man. She will not allow more aggressive compression as there was a suggestion to put her in 3 layer wraps. 10/16/17; large wounds on her bilateral medial malleoli. These are chronic. Not much change from last week. The surface looks have healthy but absolutely no epithelialization. A lot of pain little less so of drainage. She will not allow debridement or even washing these off in the vigorous fashion with Anasept. 10/23/17; large wounds on her bilateral malleoli which are chronic. Some improvement in terms of size perhaps on the right since last time I saw these. She states that after we increased the 3 layer compression there was some bleeding, when she came in for a nurse visit she did not want 3 layer compression put back on about our nurse managed to convince her. She has known chronic venous visit issues and I'm hoping to get her to tolerate the 3 layer compression. using Hydrofera Blue 10/30/17; absolutely no change in  the condition of either wound although we've had some improvement in dimensions on the right.. Attempted to put her in 3 layer compression she didn't tolerated she is back in 2 layer compression. We've been using Hydrofera Blue We looked over her past records. She had venous reflux studies in November 2016. There was no evidence of deep venous reflux on the right. Superficial vein did not show the greater saphenous vein at think this is been previously ablated the small saphenous vein was within normal limits. The left deep venous system showed no DVT the vessels were positive for deep venous reflux in the posterior tibial veins at the ankle. The greater saphenous vein was surgically absent small saphenous vein was within normal limits. She went to vein and vascular at [facility]. I believe she had an ablation on the left greater saphenous vein. I'll update her reflux studies perhaps ever reviewed by vein and vascular. We've made absolutely no progress in these wounds. Will also try to read and TheraSkins through her insurance 11/06/17; W the patient  apparently has a 2 week follow-up with vein and vascular I like him to review the whole issue with regards to her previous vascular workup by Dr. Aleda Grana. We've really made no progress on these wounds in many months. She arrives today with less viable looking surface on the left medial ankle wound. This was apparently looking about the same on Tuesday when she was here for nurse visit. 11/13/17; deep tissue culture I did last time of the left lower leg showed multiple organisms without any predominating. In particular no Staphylococcus or group A strep were isolated. We sent her for venous reflux studies. She's had a previous left greater saphenous vein stripping and I think sclerotherapy of the right greater saphenous vein. She didn't really look at the lesser saphenous vein this both wounds are on the medial aspect. She has reflux in the common  femoral vein and popliteal vein and an accessory vein on the right and the common femoral vein and popliteal vein on the left. I'm going to have her go to see vein and vascular just the look over things and see if anything else beside aggressive compression is indicated here. We have not been able to make any progress on these wounds in spite of the fact that the surface of the wounds is never look too bad. 11/20/17; no major change in the condition of the wounds. Patient reports a large amount of drainage. She has a lot of complaints of pain although enlisting her today I wonder if some of this at least his neuropathic rather than secondary to her wounds. She has an appointment with vein and vascular on 12/30/17. The refractory nature of these wounds in my mind at least need vein and vascular to look over the wounds the recent reflux studies we did and her history to see if anything further can be done here. I also note her gait is deteriorated quite a bit. 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 She has a bilateral Trendelenburg gait. I wonder if this is neuropathic or perhaps multilevel radicular. 11/27/17; her wounds actually looks slightly better. Healthy-looking granulation tissue a scant amount of epithelialization. [place] healthcare will not pay for [employer]. They will play for tri layer Oasis and Dermagraft. This is not a diabetic ulcer. We'll try for the tri layer Oasis. She still complains of some drainage. She has a vein and vascular appointment on 12/30/17 12/04/17; the wounds visually look quite good. Healthy-looking granulation with some degree of epithelialization. We are still waiting for response to our request for trial to try layer Oasis. Her appointment with vascular to review venous and arterial issues isn't sold the end of July 7/31. Not allow debridement or even vigorous cleansing of the wound surface. 12/18/17; slightly smaller especially on the right. Both wounds  have epithelialization superiorly some hyper granulation. We've been using Hydrofera Blue. We still are looking into triple layer Oasis through her insurance 01/08/18 on evaluation today patient's wound actually appears to be showing signs of good improvement at this point in time. She has been tolerating the dressing changes without complication. Fortunately there does not appear to be any evidence of infection at this point in time. We have been utilizing silver nitrate which does seem to be of benefit for her which is also good news. Overall I'm very happy with how things seem to be both regards appearance as well as measurement. Patient did see Dr. Bridgett Larsson for evaluation on 12/30/17. In his assessment he felt  that stripping would not likely add much more than chronic compression to the patient's healing process. His recommendation was to follow-up in three months with Dr. Doren Custard if she hasn't healed in order to consider referral back to you and see vascular where she previously was in a trial and was able to get her wound to heal. I'll be see what she feels she when you staying compression and he reiterated this as well. 01/13/18 on evaluation today patient appears to actually be doing very well in regard to her bilateral medial malleolus ulcers. She seems to have tolerated the chemical cauterization with silver nitrate last week she did have some pain through that evening but fortunately states that I'll be see since it seems to be doing better she is overall pleased with the progress. 01/21/18; really quite a remarkable improvement since I've last seen these wounds. We started using silver nitrate specially on the islands of hyper granulation which for some reason her around the wound circumference. This is really done quite nicely. Primary dressing Hydrofera Blue under 4 layer compression. She seems to be able to hold out without a nurse rewrap. Follow-up in 1 week 01/28/18; we've continued the  hydrofera blue but continued with chemical cauterization to the wound area that we started about a month ago for irregular hyper granulation. She is made almost stunning improvement in the overall wound dimensions. I was not really expecting this degree of improvement in these chronic wounds 02/05/18; we continue with Hydrofera Bluebut of also continued the aggressive chemical cauterization with silver nitrate. We made nice progress with the right greater than left wound. 02/12/18. We continued with Hydrofera Blue after aggressive chemical cauterization with silver nitrate. We appear to be making nice progress with both wound areas 02/19/2018; we continue with [facility] after washing the wounds vigorously with Anasept spray and chemical cauterization with silver nitrate. We are making excellent progress. The area on the right's just about closed 02/26/2018. The area on the left medial ankle had too much necrotic debris today. I used a #5 curette we are able to get most of the soft. I continued with the silver nitrate to the much smaller wound on the right medial ankle she had a new area on her right lower pretibial area which she says was due to a role in her compression 03/05/2018; both wound areas look healthy. Not much change in dimensions from last week. I continue to use silver nitrate and Hydrofera Blue. The patient saw Dr. Doren Custard of vein and vascular. He felt she had venous stasis ulcers. He felt based on her previous arteriogram she should have adequate circulation for healing. Also she has deep venous reflux but really no significant correctable superficial venous reflux at this time. He felt we should continue with conservative management including leg elevation and compression 04/02/2018; since we last saw this woman about a month ago she had a fall apparently suffered a pelvic fracture. I did not look up the x-ray. Nevertheless because of pain she literally was bedbound for 2 weeks and had  home health coming out to change the dressing. Somewhat predictably this is resulted in considerable improvement in both wound areas. The right is just about closed on the medial malleolus and the left is about half the size. 04/16/2018; both her wounds continue to go down in size. Using Hydrofera Blue. 05/07/18; both her wounds appeared to be improving especially on the right where it is almost closed. We are using Hydrofera Blue 05/14/2018; slightly  worse this week with larger wounds. Surface on the left medial not quite as good. We have been using Hydrofera Blue 05/21/18; again the wounds are slightly larger. Left medial malleolus slightly larger with eschar around the circumference. We have been using Hydrofera Blue undergoing a wraps for a prolonged period of time. This got a lot better when she was more recumbent due to a fall and a back injury. I change the primary dressing the silver alginate today. She did not tolerate a 4 layer compression previously although I may need to bring this up with her next time 05/28/2018; area on the left medial malleolus again is slightly larger with more drainage. Area on the right is roughly unchanged. She has a small area of folliculitis on the right medial just on the lower calf. This does not look ominous. 06/03/2018 left medial malleolus slightly smaller in a better looking surface. We used silver nitrate on this last time with silver alginate. The area on the right appears slightly smaller 1/10; left medial malleolus slightly smaller. Small open area on the right. We used silver nitrate and silver alginate as of 2 weeks ago. We continue with the wound and compression. These got a lot better when she was off her feet 1/17; right medial malleolus wound is smaller. The left may be slightly smaller. Both surfaces look somewhat better. 1/24; both wounds are slightly smaller. Using silver alginate under Unna boots 1/31; both wounds appear smaller in fact the area  on the right medial is just about closed. Surface eschar. We have been using silver alginate under Unna boots. The patient is less active now spends let much less time on her feet and I think this is contributed to the general improvement in the wound condition 2/7; both wounds appear smaller. I was hopeful the right medial would be closed however there there is still the same small open area. Slight amount of surface eschar on the left the dimensions are smaller there is eschar but the wound edges appear to be free. We have been using silver alginate under Unna boot's 2/14; both wounds once again measure smaller. Circumferential eschar on the left medial. We have been using silver alginate under Unna boots with gradual improvement 2/21; the area on the right medial malleolus has healed. The area on the left is smaller. We have been using silver alginate and Unna boots. We can discharge wrapping the right leg she has 20/30 stockings at home she will need to protect the scar tissue in this area 2/28; the area on the right medial malleolus remains closed the patient has a compression stocking. The area on the left is smaller. We have been using silver alginate and Unna boots. 3/6 the area on the right medial ankle remains closed. Good edema control noted she is using her own compression stocking. The area on the left medial ankle is smaller. We have been managing this with silver alginate and Unna boots which we will continue today. 3/13; the area on the right medial ankle remains closed and I'm declaring it healed today. When necessary the left is about the same still a healthy-looking surface but no major change and wound area. No evidence of infection and using silver alginate under unna and generally making considerable improvement 3/27 the area on the right medial ankle remains closed the area on the left is about the same as last week. Certainly not any worse we have been using silver alginate  under an Unna boot 4/3; the  area on the right medial ankle remains closed per the patient. We did not look at this wound. The wound on the left medial ankle is about the same surface looks healthy we have been using silver alginate under an Unna boot 4/10; area on the right medial ankle remains closed per the patient. We did not look at this wound. The wound on the left medial ankle is slightly larger. The patient complains that the [facility] caused burning pain all week. She also told us that she was a lot more active this week. Changed her back to silver alginate 4/17; right medial ankle still closed per the patient. Left medial ankle is slightly larger. Using silver alginate. She did not tolerate Hydrofera Blue on this area 4/24; right medial ankle remains closed we have not look at this. The left medial ankle continues to get larger today by about a centimeter. We have been using silver alginate under Unna boots. She complains about 4 layer compression as an alternative. She has been up on her feet working on her garden 5/8; right medial ankle remains closed we did not look at this. The left medial ankle has increased in size about 100%. We have been using silver alginate under Unna boots. She noted increased pain this week and was not surprised that the wound is deteriorated 5/15; no major change in SA however much less erythema ( one week of doxy ocellulitis). 5/22-65 year old female returns at 1 week to the clinic for left medial ankle wound for which we have been using silver alginate under 3 layer compression She was placed on DOXY at last visit - the wound is wider at this visit. She is in 3 layer compression 5/29; change to [facility] last week. I had given her empiric doxycycline 2 weeks ago for a week. She is in 3 layer compression. She complains of a lot of pain and drainage on presentation today. 6/5; using Hydrofera Blue. I gave her doxycycline recently empirically for  erythema and pain around the wound. Believe her cultures showed enterococcus which not would not have been well covered by doxycycline nevertheless the wound looks better and I don't feel specifically that the enterococcus needs to be covered. She has a new what looks like a wrap injury on her lateral left ankle. 6/12; she is using Hydrofera Blue. She has a new area on the left anterior lower tibial area. This was a wrap injury last week. 6/19; the patient is using Hydrofera Blue. She arrived with marked inflammation and erythema around the wound and tenderness. 12/01/18 on evaluation today patient appears to be doing a little bit better based on what I'm hearing from the standpoint of lassos evaluation to this as far as the overall appearance of the wound is concerned. Then sometime substandard she typically sees Dr. Dellia Nims. Nonetheless overall very pleased with the progress that she's made up to this point. No fevers, chills, nausea, or vomiting noted at this time. 7/10; some improvement in the surface area. Aggressively debrided last week apparently. I went ahead with the debridement today although the patient does not tolerate this very well. We have been using Iodoflex. Still a fair amount of drainage 7/17; slightly smaller. Using Iodoflex. 7/24; no change from last week in terms of surface area. We have been using Iodoflex. Surface looks and continues to look somewhat better 7/31; surface area slightly smaller better looking surface. We have been using Iodoflex. This is under Unna boot compression 8/7-Patient presents at 1 week  with Unna boot and Iodoflex, wound appears better 8/14-Patient presents at 1 week with Iodoflex, we use the Unna boot, wound appears to be stable better.Patient is getting Botox treatment for the inversion of the foot for tendon release, Next week 8/21; we are using Iodoflex. Unna boot. The wound is stable in terms of surface area. Under illumination there is some areas of  the wound that appear to be either epithelialized or perhaps this is adherent slough at this point I was not really clear. It did not wipe off and I was reluctant to debride this today. 8/28; we are using Iodoflex in an Unna boot. Seems to be making good improvement. 9/4; using Iodoflex and wound is slightly smaller. 9/18; we are using Iodoflex with topical silver nitrate when she is here. The wound continues to be smaller 10/2; patient missed her appointment last week due to GI issues. She left and Iodoflex based dressing on for 2 weeks. Wound is about the same size about the size of a dime on the left medial lower 10/9 we have been using Iodoflex on the medial left ankle wound. She has a new superficial probable wrap injury on the dorsal left ankle 10/16; we have been using Hydrofera Blue since last week. This is on the left medial ankle 10/23; we have been using Hydrofera Blue since 2 weeks ago. This is on the left medial ankle. Dimensions are better 11/6; using Hydrofera Blue. I think the wound is smaller but still not closed. Left medial ankle 11/13; we have been using Hydrofera Blue. Wound is certainly no smaller this week. Also the surface not as good. This is the remanent of a very large area on her left medial ankle. 11/20; using Sorbact since last week. Wound was about the same in terms of size although I was disappointed about the surface debris 12/11; 3-week follow-up. Patient was on vacation. Wound is measuring slightly larger we have been using Sorbact. 12/18; wound is about the same size however surface looks better last week after debridement. We have been using Sorbact under compression 1/15 wound is probably twice the size of last time increased in length nonviable surface. We have been using Sorbact. She was running a mild fever and missed her appointment last week 1/22; the wound is come down in size but under illumination still a very adherent debris we have been Hydrofera Blue  that I changed her to last week 1/29; dimensions down slightly. We have been using Hydrofera Blue 2/19 dimensions are the same however there is rims of epithelialization under illumination. Therefore more the surface area may be epithelialized 2/26; the patient's wound actually measures smaller. The wound looks healthy. We have been using Hydrofera Blue. I had some thoughts about running Apligraf then I still may do that however this looks so much better this week we will delay that for now 3/5; the wound is small but about the same as last week. We have been using Hydrofera Blue. No debridement is required today. 3/19; the wound is about the size of a dime. Healthy looking wound even under illumination. We have been using Hydrofera Blue. No mechanical debridement is necessary 3/26; not much change from last week although still looks very healthy. We have been using Hydrofera Blue under Unna boots Patient was offered an ankle fusion by podiatry but not until the wound heals with a proceed with this. 4/9; the patient comes in today with her original wound on the medial ankle looking satisfactory however she  has some uncontrolled swelling in the middle part of her leg with 2 new open areas superiorly just lateral to the tibia. I think this was probably a wrap issue. She said she felt uncomfortable during the week but did not call in. We have been using Hydrofera Blue 4/16; the wound on the medial ankle is about the same. She has innumerable small areas superior to this across her mid tibia. I think this is probably folliculitis. She is also been working in the yard doing a lot of sweating 4/30; the patient issue on the upper areas across her mid tibia of all healed. I think this was excessive yard work if I remember. Her wound on the medial ankle is smaller. Some debris on this we have been using Hydrofera Blue under Unna boots 5/7; mid tibia. She has been using Hydrofera Blue under an Unna wrap. She  is apparently going for her ankle surgery on June 3 10/28/19-Patient returns to clinic with the ankle wound, we are using Hydrofera Blue under Unna wrap, surgery is scheduled for her left foot for June 3 so she will be back for nurse visit next week READMISSION 01/17/2020 [name] is a 65 year old woman we have had in this clinic for a long period of time with severe venous hypertension and refractory wounds on her medial lower legs and ankles bilaterally. 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 When she was off her legs for a prolonged period example when she fell and suffered a compression fracture things would heal up quite nicely. She is now retired and we managed to heal up the right medial leg wound. The left one was very tiny last time I saw this although still refractory. 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 It got to the point where this was interfering with her walking and she elected to proceed with a ankle arthrodesis to straighten her her ankle and leave her with a functional outcome for mobilization. The patient was referred to Dr. Doren Custard and really this took some time to arrange. Dr. Doren Custard saw her on 12/07/2019. Once again he verified that she had no arterial issues. She had previously had an angiogram several years ago. Follow-up ABIs on the left showed an ABI of 1.12 with triphasic waveforms and a TBI of 0.92. She is felt to have chronic deep venous insufficiency but I do not think it was felt that anything could be done from about this from an ablation point of view. At the time Dr. Doren Custard saw this patient the wounds actually look closed via the pictures in his clinic. The patient finally underwent her surgery on 12/15/2019. This went reasonably well and there was a  good anatomic outcome. She developed a small distal wound dehiscence on the lateral part of the surgical wound. However more problematically she is developed recurrence of the wound on the medial left ankle. There are actually 2 wounds here one in the distal lower leg and 1 pretty much at the level of the medial malleolus. It is a more distal area that is more problematic. She has been using Hydrofera Blue which started on Friday before this she was simply Ace wrapping. There was a culture done that showed Pseudomonas and she is on ciprofloxacin. A recent CNS on 8/11  was negative. The patient reports some pain but I generally think this is improving. She is using a cam boot completely nonweightbearing using a walker for pivot transfers and a wheelchair 8/24; not much improvement unfortunately she has a surgical wound on the lateral part in the venous insufficiency wound medially. The bottom part of the medial insufficiency wound is still necrotic there is exposed tendon here. We have been using Hydrofera Blue under compression. Her edema control is however better 8/31; patient in for follow-up of his surgical wound on the lateral part of her left leg and chronic venous insufficiency ulcers medially. We put her back in compression last week. She comes in today with a complaint of 3 or 4 days worth of increasing pain. She felt her cam walker was rubbing on the area on the back of her heel. However there is intense erythema seems more likely she has cellulitis. She had 2 cultures done when she was seeing podiatry in the postop. One of them in late July showed Pseudomonas and she received a course of ciprofloxacin the other was negative on 8/11 she is allergic to penicillin with anaphylactoid complaints of hives oral swelling via information in epic 9/9; when I saw this patient last week she had intense anterior erythema around her wound on the right lateral heel and ankle and also into the right medial  heel. Some of this was no doubt drainage and her walker boot however I was convinced she had cellulitis. I gave her Levaquin and Bactrim she is finishing up on this now. She is following up with Dr. [name] he saw her yesterday. He is taken her out of the walking boot of course she is still nonweightbearing. Her x-ray was negative for any worrisome features such as soft tissue air etc. Things are a lot better this week. She has home health. We have been using Hydrofera Blue under an [employer] which she put back on yesterday. I did not wrap her last week 9/17; her surrounding skin looks a lot better. In fact the area on the left lateral ankle has just a scant amount of eschar. The only remaining wound is the large area on the left medial ankle. Probably about 60% of this is healthy granulation at the surface however she has a significant divot distally. This has adherent debris in it. I been using debridement and silver collagen to try and get this area to fill-in although I do not think we have made much progress this week 9/24; the patient's wound on the left medial ankle looks a lot better. The deeper divot area distally still requires debridement but this is cleaning up quite nicely we have been using silver collagen. The patient is complaining of swelling in her foot and is worried that that is contributing to the nonhealing of the ankle wound. She is also complaining of numbness in her anterior toes 10/4; left medial ankle. The small area distally still has a divot with necrotic material that I have been debriding away. This has an undermining area. She is approved for Apligraf. She saw Dr. [name] her surgeon on 10/1. I think he declared himself is satisfied with the condition of things. Still nonweightbearing till the end of the month. 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 Her surgical wound is well closed. There is no evidence of infection 10/11; the patient  arrived in clinic today with the expectation that we be able to put an Apligraf on this  area after debridement however she arrives with a relatively large amount of green drainage on the dressing. The patient states that this started on Friday. She has not been systemically unwell. 10/19; culture I did last week showed both Enterococcus and Pseudomonas. I think this came in separate parts because I stopped her ciprofloxacin I gave her and prescribed her linezolid however now looking at the final culture result this is Pseudomonas which is resistant to quinolones. She has not yet picked up the linezolid apparently phone issues. We are also trying to get a topical antibiotic out of [place] in [place] they can be applied by home health. She is still having green drainage 10/16; the patient has her topical antibiotic from [facility] in [place]. This is a compounded gel with vancomycin and ciprofloxacin and gentamicin. We are applying this on the wound bed with silver alginate over the top with Unna boot wraps. She arrives in clinic today with a lot less ominous looking drainage although she is only use this topical preparation once the second time today. She sees Dr. [name] her surgeon on Friday she has home health changing the dressing 11/2; still using her compounded topical antibiotic under silver alginate. Surface is cleaning up there is less drainage. We had an Apligraf for her today and I elected to apply it. A light coating of her antibiotic 04/25/2020 upon evaluation today patient appears to be doing well with regard to her ankle ulcer. There is a little bit of slough noted on the surface of the wound I am can have to perform sharp debridement to clear this away today. With that being said other than that fact overall I feel like she is making progress and we do see some new epithelial growth. There is also some improvement in the depth of the wound and that distal portion. There is  little bit of slough there as well. 12/7; 2-week follow-up. Apligraf #3. Dimensions are smaller. Closing in especially inferiorly. Still some surface debris. Still using the [facility] topical antibiotic but I told her that I don't think this needs to be renewed 12/21; 2-week follow-up. Apligraf #4 dimensions are smaller. Nice improvement 06/05/2020; 2-week follow-up. The patient's wound on the left medial ankle looks really excellent. Nice granulation. Advancing epithelialization no undermining no evidence of infection. We would have to reapply for another Apligraf but with the condition of this wound I did not feel strongly about it. We used Hydrofera Blue under the same degree of compression. She follows up with Dr. [name] her surgeon a week Friday 06/13/2020 upon evaluation today patient appears to be doing excellent in regard to her wound. She has been tolerating the dressing changes without complication. Fortunately there is no signs of active infection at this time. No fevers, chills, nausea, vomiting, or diarrhea. She was using Hydrofera Blue last week. 06/20/2020 06/20/2020 on evaluation today patient actually appears to be doing excellent in regard to her wound. This is measuring better and looking much better as well. She has been using the collagen that seems to be doing better for her as well even though the [facility] was and is not sticking or feeling as rough on her wound. She did see Dr. [name] on Friday he is very pleased he also stated none of the hardware has shifted. That is great news 1/27; the patient has a small clean wound all that is remaining. I agree that this is too small to really consider an Apligraf. Under illumination the surface is  looking quite good. We have been using collagen although the dimensions are not any better this week 2/2; the patient has a small clean wound on the left medial ankle. Although this left of her substantial original areas. Measurements are  smaller. We have been using polymen Ag under an Haematologist. 2/10; small area on the left medial ankle. This looks clean nothing to debride however dimensions are about the same we have been using polymen I think now for 2 weeks 2/17; not much change in surface area. We have been using polymen Ag without any improvement. 3/17; 1 month follow-up. The patient has been using endoform without any improvement in fact I think this looks worse with more depth and more expansion 3/24; no improvement. Perhaps less debris on the surface. We have been using Sorbact for 1 week 4/4; wound measures larger. She has edema in her leg and her foot which she tells as her wrap came down. We have been using Unna boots. Sorbact of the wound. She has been approved for Apligraf 09/12/2020 upon evaluation today patient appears to be doing well with regard to her wound. We did get the Apligraf reapproved which is great news we have that available for application today. Fortunately there is no signs of infection and overall the patient seems to be doing great. The wound bed is nice and clean. 4/27; patient presents for her second application of Apligraf. She states over the past week she has been on her feet more often due to being outside in her garden. She has noted more swelling to her foot as a result. She denies increased warmth, pain or erythema to the wound site. 10/10/2020 upon evaluation today patient appears to be doing well with regard to her wound which does not appear to be quite as irritated as last week from what I am hearing. With that being said unfortunately she is having issues with some erythema and warmth to touch as well as an increase size. I do believe this likely is infected. 10/17/2020 upon evaluation today patient appears to be doing excellent in regard to her wound this is significantly improved as compared to last week. Fortunately I think that the infection is much better controlled at this point. She  did have evidence of both Enterococcus as well as Staphylococcus noted on culture. Enterococcus really would not be helped significantly by the Cipro but the wound is doing so much better I am under the assumption that the Staphylococcus is probably the main organism that is causing the current infection. Nonetheless I think that she is doing excellent as far as that is concerned and I am very pleased in that regard. I would therefore recommend she continue with the Cipro. Objective Constitutional Well-nourished and well-hydrated in no acute distress. Vitals Time Taken: 9:42 AM, Height: 68 in, Temperature: 97.6 F, Pulse: 55 bpm, Respiratory Rate: 17 breaths/min, Blood Pressure: 135/70 mmHg. Respiratory normal breathing without difficulty. Psychiatric this patient is able to make decisions and demonstrates good insight into disease process. Alert and Oriented x 3. pleasant and cooperative. General Notes: Upon inspection patient's wound bed actually showed signs of good granulation epithelization at this point. There does not appear to be any signs of infection which is great news overall I am extremely pleased with where things stand. I think that she is actually okay to apply the Apligraf I did need to clean weights on the surface of the wound today I performed a light sharp debridement of the surface of  the wound which she tolerated without complication. Integumentary (Hair, Skin) Wound #15 status is Open. Original cause of wound was Gradually Appeared. The date acquired was: 12/30/2019. The wound has been in treatment 39 weeks. The wound is located on the Left,Medial Malleolus. The wound measures 3.8cm length x 2.5cm width x 0.1cm depth; 7.461cm^2 area and 0.746cm^3 volume. There is Fat Layer (Subcutaneous Tissue) exposed. There is no tunneling or undermining noted. There is a medium amount of serosanguineous drainage noted. The wound margin is flat and intact. There is medium (34-66%) red  granulation within the wound bed. There is a medium (34-66%) amount of necrotic tissue within the wound bed including Adherent Slough. 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 There was a Excisional Skin/Subcutaneous Tissue Debridement with a total area of 9.5 sq cm performed by [name], PA. With the following instrument(s): Curette to remove Viable and Non-Viable tissue/material. Material removed includes Subcutaneous Tissue and Slough and after achieving pain control using Lidocaine 5% topical ointment. No specimens were taken. A time out was conducted at 09:55, prior to the start of the procedure. A Minimum amount of bleeding was controlled with Pressure. The procedure was tolerated well with a pain level of 3 throughout and a pain level of 1 following the procedure. Post Debridement Measurements: 3.8cm length x 2.5cm width x 0.1cm depth; 0.746cm^3 volume. Character of Wound/Ulcer Post Debridement is improved. Severity of Tissue Post Debridement is: Fat layer exposed. Post procedure Diagnosis Wound #15: Same as Pre-Procedure Pre-procedure diagnosis of Wound #15 is a Venous Leg Ulcer located on the Left,Medial Malleolus. A skin graft procedure using a bioengineered skin substitute/cellular or tissue based product was performed by [name], PA with the following instrument(s): Blade, Forceps, and Scissors. Apligraf was applied and secured with Steri-Strips. 22 sq cm of product was utilized and 22 sq cm was wasted due to wound size. Post Application, adaptic, gauze  was applied. A Time Out was conducted at 10:10, prior to the start of the procedure. The procedure was tolerated well with a pain level of 0 throughout and a pain level of 0 following the procedure. Post procedure Diagnosis Wound #15: Same as Pre-Procedure . Pre-procedure diagnosis of Wound #15 is a Venous Leg Ulcer located on the Left,Medial Malleolus . There was a Haematologist Compression Therapy Procedure by [name], RN. Post procedure Diagnosis Wound #15: Same as Pre-Procedure Plan Follow-up Appointments: Return Appointment in 2 weeks. - with Margarita Grizzle Nurse Visit: - dressing change post apligraf Cellular or Tissue Based Products: Cellular or Tissue Based Product Type: - apligraf #3 Cellular or Tissue Based Product applied to wound bed, secured with steri-strips, cover with Adaptic or Mepitel. (DO NOT REMOVE). Bathing/ Shower/ Hygiene: May shower with protection but do not get wound dressing(s) wet. Edema Control - Lymphedema / SCD / Other: Elevate legs to the level of the heart or above for 30 minutes daily and/or when sitting, a frequency of: - throughout the day Avoid standing for long periods of time. Exercise regularly Compression stocking or Garment 20-30 mm/Hg pressure to: - right leg daily WOUND #15: - Malleolus Wound Laterality: Left, Medial Cleanser: Soap  and Water 1 x Per Week/30 Days Discharge Instructions: May shower and wash wound with dial antibacterial soap and water prior to dressing change. Peri-Wound Care: Sween Lotion (Moisturizing lotion) 1 x Per Week/30 Days Discharge Instructions: Apply moisturizing lotion as directed Secondary Dressing: Woven Gauze Sponge, Non-Sterile 4x4 in 1 x Per Week/30 Days Discharge Instructions: Apply over primary dressing as directed. Secondary Dressing: ABD Pad, 5x9 1 x Per Week/30 Days Discharge Instructions: Apply over primary dressing as directed. Compression Wrap: Unnaboot w/Calamine, 4x10 (in/yd) 1 x Per Week/30  Days Discharge Instructions: Apply Unnaboot as directed. 1. Would recommend currently that we going to continue with the wound care measures as before and the patient is in agreement with plan that includes the use of the Apligraf which I think is appropriate to reapply here this is application #3 of the Apligraf. 2. I am also can recommend that we continue with the Unna boot as directed since the patient seems to be doing well with this I see no reason to make any significant changes. 3. She will also continue with the Cipro that is controlling the infection in an excellent fashion overall her wound seems to be significantly improved. We will see patient back for reevaluation in 1 week here in the clinic. If anything worsens or changes patient will contact our office for additional recommendations. Electronic Signature(s) Signed: 10/17/2020 12:23:41 PM By: [name] PA-C Entered By: [name] on 10/17/2020 12:23:41 -------------------------------------------------------------------------------- SuperBill Details Patient Name: Date of Service: [facility] [name], [name] 10/17/2020 Medical Record Number: [id number] Patient Account Number: [id number] Date of Birth/Sex: Treating RN: 1956/01/22 (65 y.o. [name], Linda Primary Care Provider: [name] Other Clinician: Referring Provider: Treating Provider/Extender: [name] in Treatment: 39 Diagnosis Coding ICD-10 Codes Code Description [phone] Chronic venous hypertension (idiopathic) with ulcer and inflammation of left lower extremity L97.828 Non-pressure chronic ulcer of other part of left lower leg with other specified severity L97.328 Non-pressure chronic ulcer of left ankle with other specified severity Facility Procedures CPT4 Code: [id number] Description: (Facility Use Only) Apligraf 1 SQ CM Modifier: Quantity: [place] Code: [id number] Description: [id number] - SKIN SUB GRAFT TRNK/ARM/LEG ICD-10  Diagnosis Description L97.828 Non-pressure chronic ulcer of other part of left lower leg with other specified s Modifier: everity Quantity: 1 Physician Procedures : CPT4 Code Description Modifier [id number] - WC PHYS SKIN SUB GRAFT TRNK/ARM/LEG ICD-10 Diagnosis Description L97.828 Non-pressure chronic ulcer of other part of left lower leg with other specified severity Quantity: 1 Electronic Signature(s) Signed: 10/17/2020 12:23:51 PM By: [name] PA-C Entered By: [name] on 10/17/2020 12:23:50

## 2020-10-18 NOTE — Progress Notes (Signed)
[name], GLANZ ([id number]) Visit Report for 10/17/2020 Arrival Information Details Patient Name: Date of Service: [name] 10/17/2020 9:15 A M Medical Record Number: [id number] Patient Account Number: [id number] Date of Birth/Sex: Treating RN: July 04, 1955 (65 y.o. [name] Haydan Mansouri: [name] Other Clinician: Referring Caydn Justen: Treating Royelle Hinchman/Extender: [name] in Treatment: 12 Visit Information History Since Last Visit Added or deleted any medications: No Patient Arrived: Cane Any new allergies or adverse reactions: No Arrival Time: 09:41 Had a fall or experienced change in No Accompanied By: self activities of daily living that may affect Transfer Assistance: None risk of falls: Patient Identification Verified: Yes Signs or symptoms of abuse/neglect since last visito No Secondary Verification Process Completed: Yes Hospitalized since last visit: No Patient Requires Transmission-Based Precautions: No Implantable device outside of the clinic excluding No Patient Has Alerts: Yes cellular tissue based products placed in the center Patient Alerts: L ABI =1.12, TBI = .92 since last visit: R ABI= 1.02, TBI= .58 Has Dressing in Place as Prescribed: Yes Pain Present Now: No Electronic Signature(s) Signed: 10/17/2020 11:07:31 AM By: [name] Entered By: [name] on 10/17/2020 09:42:56 -------------------------------------------------------------------------------- Compression Therapy Details Patient Name: Date of Service: [name], [name] 10/17/2020 9:15 A M Medical Record Number: [id number] Patient Account Number: [id number] Date of Birth/Sex: Treating RN: [birth year] (65 y.o. [name] Charisa Twitty: [name] Other Clinician: Referring Kishan Wachsmuth: Treating Gargi Berch/Extender: [name] in Treatment: 39 Compression Therapy Performed for Wound  Assessment: Wound #15 Left,Medial Malleolus Performed By: Clinician [name], RN Compression Type: Rolena Infante Post Procedure Diagnosis Same as Pre-procedure Electronic Signature(s) Signed: 10/17/2020 6:10:17 PM By: Baruch Gouty RN, BSN Entered By: Baruch Gouty on 10/17/2020 10:06:12 -------------------------------------------------------------------------------- Encounter Discharge Information Details Patient Name: Date of Service: [name], [name] 10/17/2020 9:15 A M Medical Record Number: [id number] Patient Account Number: [id number] Date of Birth/Sex: Treating RN: 1955-08-21 (65 y.o. [name], [name] Primary Care Maahi Lannan: [name] Other Clinician: Referring Towana Stenglein: Treating Hargis Vandyne/Extender: [name] in Treatment: 104 Encounter Discharge Information Items Post Procedure Vitals Discharge Condition: Stable Temperature (F): 97.4 Ambulatory Status: Cane Pulse (bpm): 84 Discharge Destination: Home Respiratory Rate (breaths/min): 17 Transportation: Private Auto Blood Pressure (mmHg): 119/84 Accompanied By: self Schedule Follow-up Appointment: Yes Clinical Summary of Care: Patient Declined Electronic Signature(s) Signed: 10/17/2020 6:38:21 PM By: [name] RN Entered By: [name] on 10/17/2020 11:29:06 -------------------------------------------------------------------------------- Lower Extremity Assessment Details Patient Name: Date of Service: [name] [facility] [name], [name] 10/17/2020 9:15 A M Medical Record Number: [id number] Patient Account Number: [id number] Date of Birth/Sex: Treating RN: 1955-07-04 (65 y.o. [name] Cledith Abdou: [name] Other Clinician: Referring Jaquann Guarisco: Treating Love Chowning/Extender: [name] in Treatment: 39 Edema Assessment Assessed: [Left: No] [Right: No] Edema: [Left: Ye] [Right: s] Calf Left: Right: Point of Measurement: 31  cm From Medial Instep 28.3 cm Ankle Left: Right: Point of Measurement: 11 cm From Medial Instep 22.3 cm Vascular Assessment Pulses: Dorsalis Pedis Palpable: [Left:Yes] Electronic Signature(s) Signed: 10/17/2020 6:10:17 PM By: Baruch Gouty RN, BSN Entered By: Baruch Gouty on 10/17/2020 10:00:26 -------------------------------------------------------------------------------- Multi-Disciplinary Care Plan Details Patient Name: Date of Service: [name] Brandy [name], [name] 10/17/2020 9:15 A M Medical Record Number: [id number] Patient Account Number: [id number] Date of Birth/Sex: Treating RN: [birth year] (65 y.o. [name] Isiac Breighner: [name] Other Clinician: Referring  Camarion Weier: Treating Tomica Arseneault/Extender: [name] in Treatment: 25 Multidisciplinary Care Plan reviewed with physician Active Inactive Venous Leg Ulcer Nursing Diagnoses: Actual venous Insuffiency (use after diagnosis is confirmed) Knowledge deficit related to disease process and management Goals: Patient will maintain optimal edema control Date Initiated: 06/20/2020 Target Resolution Date: 11/07/2020 Goal Status: Active Interventions: Assess peripheral edema status every visit. Compression as ordered Treatment Activities: Therapeutic compression applied : 06/20/2020 Notes: Wound/Skin Impairment Nursing Diagnoses: Impaired tissue integrity Knowledge deficit related to ulceration/compromised skin integrity Goals: Patient/caregiver will verbalize understanding of skin care regimen Date Initiated: 01/17/2020 Target Resolution Date: 11/07/2020 Goal Status: Active Ulcer/skin breakdown will have a volume reduction of 30% by week 4 Date Initiated: 01/17/2020 Date Inactivated: 03/12/2020 Target Resolution Date: 03/09/2020 Goal Status: Met Interventions: Assess patient/caregiver ability to obtain necessary supplies Assess patient/caregiver ability to perform ulcer/skin care  regimen upon admission and as needed Assess ulceration(s) every visit Provide education on ulcer and skin care Notes: Electronic Signature(s) Signed: 10/17/2020 6:10:17 PM By: Baruch Gouty RN, BSN Entered By: Baruch Gouty on 10/17/2020 10:05:04 -------------------------------------------------------------------------------- Pain Assessment Details Patient Name: Date of Service: [name], [name] 10/17/2020 9:15 A M Medical Record Number: [id number] Patient Account Number: [id number] Date of Birth/Sex: Treating RN: Jan 25, 1956 (65 y.o. [name] Hopelynn Gartland: [name] Other Clinician: Referring Tila Millirons: Treating Kalenna Millett/Extender: [name] in Treatment: 72 Active Problems Location of Pain Severity and Description of Pain Patient Has Paino No Site Locations Pain Management and Medication Current Pain Management: Electronic Signature(s) Signed: 10/17/2020 11:07:31 AM By: [name] Signed: 10/17/2020 6:10:17 PM By: Baruch Gouty RN, BSN Entered By: [name] on 10/17/2020 09:45:04 -------------------------------------------------------------------------------- Patient/Caregiver Education Details Patient Name: Date of Service: [name] Brandy [name], [name] 5/18/2022andnbsp9:15 A M Medical Record Number: [id number] Patient Account Number: [id number] Date of Birth/Gender: Treating RN: 1956-04-19 (65 y.o. [name] Physician: [name] Other Clinician: Referring Physician: Treating Physician/Extender: [name] in Treatment: 69 Education Assessment Education Provided To: Patient Education Topics Provided Infection: Methods: Explain/Verbal Responses: Reinforcements needed, State content correctly Venous: Methods: Explain/Verbal Responses: Reinforcements needed, State content correctly Electronic Signature(s) Signed: 10/17/2020 6:10:17 PM By: Baruch Gouty  RN, BSN Entered By: Baruch Gouty on 10/17/2020 10:05:31 -------------------------------------------------------------------------------- Wound Assessment Details Patient Name: Date of Service: [name], [name] 10/17/2020 9:15 A M Medical Record Number: [id number] Patient Account Number: [id number] Date of Birth/Sex: Treating RN: [birth year] (65 y.o. [name] Berle Fitz: [name] Other Clinician: Referring Darroll Bredeson: Treating Channell Quattrone/Extender: [name] in Treatment: 39 Wound Status Wound Number: 15 Primary Venous Leg Ulcer Etiology: Wound Location: Left, Medial Malleolus Wound Status: Open Wounding Event: Gradually Appeared Comorbid Peripheral Venous Disease, Osteoarthritis, Confinement Date Acquired: 12/30/2019 History: Anxiety Weeks Of Treatment: 39 Clustered Wound: No Photos Wound Measurements Length: (cm) 3.8 Width: (cm) 2.5 Depth: (cm) 0.1 Area: (cm) 7.461 Volume: (cm) 0.746 % Reduction in Area: 36% % Reduction in Volume: 92% Epithelialization: Small (1-33%) Tunneling: No Undermining: No Wound Description Classification: Full Thickness With Exposed Support Structures Wound Margin: Flat and Intact Exudate Amount: Medium Exudate Type: Serosanguineous Exudate Color: red, brown Foul Odor After Cleansing: No Slough/Fibrino Yes Wound Bed Granulation Amount: Medium (34-66%) Exposed Structure Granulation Quality: Red Fascia Exposed: No Necrotic Amount: Medium (34-66%) Fat Layer (Subcutaneous Tissue) Exposed: Yes Necrotic Quality: Adherent Slough Tendon Exposed: No Muscle Exposed: No Joint Exposed: No Bone Exposed: No Treatment  Notes Wound #15 (Malleolus) Wound Laterality: Left, Medial Cleanser Soap and Water Discharge Instruction: May shower and wash wound with dial antibacterial soap and water prior to dressing change. Peri-Wound Care Sween Lotion (Moisturizing lotion) Discharge Instruction: Apply  moisturizing lotion as directed Topical Primary Dressing Secondary Dressing Woven Gauze Sponge, Non-Sterile 4x4 in Discharge Instruction: Apply over primary dressing as directed. ABD Pad, 5x9 Discharge Instruction: Apply over primary dressing as directed. Secured With Compression Wrap Unnaboot w/Calamine, 4x10 (in/yd) Discharge Instruction: Apply Unnaboot as directed. Compression Stockings Add-Ons Electronic Signature(s) Signed: 10/17/2020 6:10:17 PM By: Baruch Gouty RN, BSN Signed: 10/18/2020 8:07:53 AM By: [name] Entered By: [name] on 10/17/2020 16:57:07 -------------------------------------------------------------------------------- Vitals Details Patient Name: Date of Service: [name], [name] 10/17/2020 9:15 A M Medical Record Number: [id number] Patient Account Number: [id number] Date of Birth/Sex: Treating RN: 1956-01-02 (65 y.o. [name] Naoki Migliaccio: [name] Other Clinician: Referring Aero Drummonds: Treating Feliz Herard/Extender: [name] in Treatment: 82 Vital Signs Time Taken: 09:42 Temperature (F): 97.6 Height (in): 68 Pulse (bpm): 55 Respiratory Rate (breaths/min): 17 Blood Pressure (mmHg): 135/70 Reference Range: 80 - 120 mg / dl Electronic Signature(s) Signed: 10/17/2020 11:07:31 AM By: [name] Entered By: [name] on 10/17/2020 09:44:42

## 2020-10-24 ENCOUNTER — Other Ambulatory Visit: Payer: Self-pay

## 2020-10-24 ENCOUNTER — Encounter (HOSPITAL_BASED_OUTPATIENT_CLINIC_OR_DEPARTMENT_OTHER): Payer: Medicare Other | Admitting: Internal Medicine

## 2020-10-24 DIAGNOSIS — I87332 Chronic venous hypertension (idiopathic) with ulcer and inflammation of left lower extremity: Secondary | ICD-10-CM | POA: Diagnosis not present

## 2020-10-24 NOTE — Progress Notes (Signed)
[name], GRISSOM ([id number]) Visit Report for 10/24/2020 Arrival Information Details Patient Name: Date of Service: [facility], [name] 10/24/2020 10:00 A M Medical Record Number: [id number] Patient Account Number: [id number] Date of Birth/Sex: Treating RN: Oct 17, 1955 (65 y.o. [name], [name] Primary Care Aprill Banko: [name] Other Clinician: Referring Ines Warf: Treating Khaliah Barnick/Extender: [name] in Treatment: 22 Visit Information History Since Last Visit Added or deleted any medications: No Patient Arrived: Ambulatory Any new allergies or adverse reactions: No Arrival Time: 10:23 Had a fall or experienced change in No Accompanied By: self activities of daily living that may affect Transfer Assistance: None risk of falls: Patient Identification Verified: Yes Signs or symptoms of abuse/neglect since last visito No Secondary Verification Process Completed: Yes Hospitalized since last visit: No Patient Requires Transmission-Based Precautions: No Implantable device outside of the clinic excluding No Patient Has Alerts: Yes cellular tissue based products placed in the center Patient Alerts: L ABI =1.12, TBI = .92 since last visit: R ABI= 1.02, TBI= .58 Has Dressing in Place as Prescribed: Yes Has Compression in Place as Prescribed: Yes Pain Present Now: No Electronic Signature(s) Signed: 10/24/2020 4:26:30 PM By: [name] RN Entered By: [name] on 10/24/2020 10:25:04 -------------------------------------------------------------------------------- Compression Therapy Details Patient Name: Date of Service: [name], [name] 10/24/2020 10:00 A M Medical Record Number: [id number] Patient Account Number: [id number] Date of Birth/Sex: Treating RN: 05/05/1956 (65 y.o. [name] Primary Care Nickolette Espinola: [name] Other Clinician: Referring Erma Joubert: Treating Kaisey Huseby/Extender: [name]  in Treatment: 40 Compression Therapy Performed for Wound Assessment: Wound #15 Left,Medial Malleolus Performed By: Clinician [name], RN Compression Type: Three Hydrologist) Signed: 10/24/2020 4:26:30 PM By: [name] RN Entered By: [name] on 10/24/2020 11:57:07 -------------------------------------------------------------------------------- Encounter Discharge Information Details Patient Name: Date of Service: [name], [name] 10/24/2020 10:00 A M Medical Record Number: [id number] Patient Account Number: [id number] Date of Birth/Sex: Treating RN: [birth year] (65 y.o. [name], [name] Primary Care [name]: [name] Other Clinician: Referring [name]: Treating [name]/Extender: [name] in Treatment: 100 Encounter Discharge Information Items Discharge Condition: Stable Ambulatory Status: Ambulatory Discharge Destination: Home Transportation: Private Auto Accompanied By: self Schedule Follow-up Appointment: Yes Clinical Summary of Care: Patient Declined Electronic Signature(s) Signed: 10/24/2020 4:26:30 PM By: [name] RN Entered By: [name] on 10/24/2020 11:58:47 -------------------------------------------------------------------------------- Patient/Caregiver Education Details Patient Name: Date of Service: [name] [name], [name] 5/25/2022andnbsp10:00 A M Medical Record Number: [id number] Patient Account Number: [id number] Date of Birth/Gender: Treating RN: Oct 03, 1955 (65 y.o. [name] Primary Care Physician: [name] Other Clinician: Referring Physician: Treating Physician/Extender: [name] in Treatment: 68 Education Assessment Education Provided To: Patient Education Topics Provided Wound/Skin Impairment: Methods: Occupational psychologist) Signed: 10/24/2020 4:26:30 PM By: [name] RN Entered By:  [name] on 10/24/2020 11:57:42 -------------------------------------------------------------------------------- Wound Assessment Details Patient Name: Date of Service: [name], [name] 10/24/2020 10:00 A M Medical Record Number: [id number] Patient Account Number: [id number] Date of Birth/Sex: Treating RN: 08/06/1955 (65 y.o. [name], [name] Primary Care [name]: [name] Other Clinician: Referring [name]: Treating [name]/Extender: [name] in Treatment: 40 Wound Status Wound Number: 15 Primary Etiology: Venous Leg Ulcer Wound Location: Left, Medial Malleolus Wound Status: Open Wounding Event: Gradually Appeared Date Acquired: 12/30/2019 Weeks Of Treatment: 40 Clustered Wound: No Wound Measurements Length: (cm) 3.8 Width: (cm) 2.5 Depth: (cm) 0.1 Area: (cm)  7.461 Volume: (cm) 0.746 % Reduction in Area: 36% % Reduction in Volume: 92% Wound Description Classification: Full Thickness With Exposed Support Structures Treatment Notes Wound #15 (Malleolus) Wound Laterality: Left, Medial Cleanser Soap and Water Discharge Instruction: May shower and wash wound with dial antibacterial soap and water prior to dressing change. Peri-Wound Care Sween Lotion (Moisturizing lotion) Discharge Instruction: Apply moisturizing lotion as directed Topical Primary Dressing Secondary Dressing Woven Gauze Sponge, Non-Sterile 4x4 in Discharge Instruction: Apply over primary dressing as directed. ABD Pad, 5x9 Discharge Instruction: Apply over primary dressing as directed. Secured With Compression Wrap Unnaboot w/Calamine, 4x10 (in/yd) Discharge Instruction: Apply Unnaboot as directed. Compression Stockings Add-Ons Electronic Signature(s) Signed: 10/24/2020 4:26:30 PM By: [name] RN Entered By: [name] on 10/24/2020 10:28:30 -------------------------------------------------------------------------------- Vitals  Details Patient Name: Date of Service: [name], [name] 10/24/2020 10:00 A M Medical Record Number: [id number] Patient Account Number: [id number] Date of Birth/Sex: Treating RN: April 14, 1956 (65 y.o. [name], [name] Primary Care [name]: [name] Other Clinician: Referring [name]: Treating [name]/Extender: [name] in Treatment: 40 Vital Signs Time Taken: 10:25 Temperature (F): 97.4 Height (in): 68 Pulse (bpm): 65 Respiratory Rate (breaths/min): 17 Blood Pressure (mmHg): 162/74 Reference Range: 80 - 120 mg / dl Electronic Signature(s) Signed: 10/24/2020 4:26:30 PM By: [name] RN Entered By: [name] on 10/24/2020 10:28:10

## 2020-10-26 ENCOUNTER — Ambulatory Visit: Payer: Medicare Other | Admitting: Podiatry

## 2020-10-26 ENCOUNTER — Telehealth: Payer: Self-pay | Admitting: *Deleted

## 2020-10-26 NOTE — Telephone Encounter (Signed)
Scheduled appointment 09/18/20.

## 2020-10-31 ENCOUNTER — Encounter (HOSPITAL_BASED_OUTPATIENT_CLINIC_OR_DEPARTMENT_OTHER): Payer: Medicare Other | Attending: Physician Assistant | Admitting: Physician Assistant

## 2020-10-31 ENCOUNTER — Other Ambulatory Visit: Payer: Self-pay

## 2020-10-31 DIAGNOSIS — L97828 Non-pressure chronic ulcer of other part of left lower leg with other specified severity: Secondary | ICD-10-CM | POA: Diagnosis not present

## 2020-10-31 DIAGNOSIS — I87332 Chronic venous hypertension (idiopathic) with ulcer and inflammation of left lower extremity: Secondary | ICD-10-CM | POA: Insufficient documentation

## 2020-10-31 DIAGNOSIS — Z880 Allergy status to penicillin: Secondary | ICD-10-CM | POA: Insufficient documentation

## 2020-10-31 NOTE — Progress Notes (Signed)
LIVI, AIME ([id number]) Visit Report for 10/24/2020 SuperBill Details Patient Name: Date of Service: [facility], [name] 10/24/2020 Medical Record Number: [id number] Patient Account Number: [id number] Date of Birth/Sex: Treating RN: [birth year] (65 y.o. 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

## 2020-11-01 NOTE — Progress Notes (Signed)
[name], [name] ([id number]) Visit Report for 10/31/2020 Arrival Information Details Patient Name: Date of Service: [place] [name], [name] 10/31/2020 10:30 A M Medical Record Number: [id number] Patient Account Number: [id number] Date of Birth/Sex: Treating RN: January 17, 1956 (65 y.o. [name] Primary Care Erol Flanagin: [name] Other Clinician: Referring Pretty Weltman: Treating Lachandra Dettmann/Extender: [name] in Treatment: 83 Visit Information History Since Last Visit Added or deleted any medications: No Patient Arrived: Cane Any new allergies or adverse reactions: No Arrival Time: 10:28 Had a fall or experienced change in No Accompanied By: self activities of daily living that may affect Transfer Assistance: None risk of falls: Patient Identification Verified: Yes Signs or symptoms of abuse/neglect since last visito No Secondary Verification Process Completed: Yes Hospitalized since last visit: No Patient Requires Transmission-Based Precautions: No Implantable device outside of the clinic excluding No Patient Has Alerts: Yes cellular tissue based products placed in the center Patient Alerts: L ABI =1.12, TBI = .92 since last visit: R ABI= 1.02, TBI= .58 Has Dressing in Place as Prescribed: Yes Pain Present Now: No Electronic Signature(s) Signed: 11/01/2020 1:41:47 PM By: [name] Entered By: [name] on 10/31/2020 10:28:40 -------------------------------------------------------------------------------- Compression Therapy Details Patient Name: Date of Service: [name], [name] 10/31/2020 10:30 A M Medical Record Number: [id number] Patient Account Number: [id number] Date of Birth/Sex: Treating RN: Apr 29, 1956 (65 y.o. [name] Primary Care Yorel Redder: [name] Other Clinician: Referring Anthonette Lesage: Treating Theta Leaf/Extender: [name] in Treatment: 44 Compression Therapy Performed for Wound  Assessment: Wound #15 Left,Medial Malleolus Performed By: Clinician [name], RN Compression Type: Rolena Infante Post Procedure Diagnosis Same as Pre-procedure Electronic Signature(s) Signed: 10/31/2020 6:06:42 PM By: Baruch Gouty RN, BSN Entered By: Baruch Gouty on 10/31/2020 11:14:22 -------------------------------------------------------------------------------- Encounter Discharge Information Details Patient Name: Date of Service: [name], [name] 10/31/2020 10:30 A M Medical Record Number: [id number] Patient Account Number: [id number] Date of Birth/Sex: Treating RN: 06/05/1955 (65 y.o. [name], [name] Primary Care Armour Villanueva: [name] Other Clinician: Referring Julita Ozbun: Treating Rifka Ramey/Extender: [name] in Treatment: 50 Encounter Discharge Information Items Post Procedure Vitals Discharge Condition: Stable Temperature (F): 97.4 Ambulatory Status: Ambulatory Pulse (bpm): 74 Discharge Destination: Home Respiratory Rate (breaths/min): 17 Transportation: Private Auto Blood Pressure (mmHg): 137/74 Accompanied By: self Schedule Follow-up Appointment: Yes Clinical Summary of Care: Patient Declined Electronic Signature(s) Signed: 11/01/2020 12:00:56 PM By: [name] RN Entered By: [name] on 10/31/2020 12:00:47 -------------------------------------------------------------------------------- Lower Extremity Assessment Details Patient Name: Date of Service: [name], [name] 10/31/2020 10:30 A M Medical Record Number: [id number] Patient Account Number: [id number] Date of Birth/Sex: Treating RN: 04/11/1956 (65 y.o. [name] Primary Care Maxyne Derocher: [name] Other Clinician: Referring Dvonte Gatliff: Treating Nijah Orlich/Extender: [name] in Treatment: 41 Edema Assessment Assessed: [Left: No] [Right: No] Edema: [Left: Ye] [Right: s] Calf Left: Right: Point of  Measurement: 31 cm From Medial Instep 29.5 cm Ankle Left: Right: Point of Measurement: 11 cm From Medial Instep 21.5 cm Vascular Assessment Pulses: Dorsalis Pedis Palpable: [Left:Yes] Electronic Signature(s) Signed: 10/31/2020 6:06:42 PM By: Baruch Gouty RN, BSN Entered By: Baruch Gouty on 10/31/2020 11:06:51 -------------------------------------------------------------------------------- Multi-Disciplinary Care Plan Details Patient Name: Date of Service: [name], [name] 10/31/2020 10:30 A M Medical Record Number: [id number] Patient Account Number: [id number] Date of Birth/Sex: Treating RN: 11-16-1955 (65 y.o. [name] Primary Care Jahmal Dunavant: [name] Other Clinician: Referring  Charlene Cowdrey: Treating Cay Kath/Extender: [name] in Treatment: [place] reviewed with physician Active Inactive Venous Leg Ulcer Nursing Diagnoses: Actual venous Insuffiency (use after diagnosis is confirmed) Knowledge deficit related to disease process and management Goals: Patient will maintain optimal edema control Date Initiated: 06/20/2020 Target Resolution Date: 11/07/2020 Goal Status: Active Interventions: Assess peripheral edema status every visit. Compression as ordered Treatment Activities: Therapeutic compression applied : 06/20/2020 Notes: Wound/Skin Impairment Nursing Diagnoses: Impaired tissue integrity Knowledge deficit related to ulceration/compromised skin integrity Goals: Patient/caregiver will verbalize understanding of skin care regimen Date Initiated: 01/17/2020 Target Resolution Date: 11/07/2020 Goal Status: Active Ulcer/skin breakdown will have a volume reduction of 30% by week 4 Date Initiated: 01/17/2020 Date Inactivated: 03/12/2020 Target Resolution Date: 03/09/2020 Goal Status: Met Interventions: Assess patient/caregiver ability to obtain necessary supplies Assess patient/caregiver ability to perform  ulcer/skin care regimen upon admission and as needed Assess ulceration(s) every visit Provide education on ulcer and skin care Notes: Electronic Signature(s) Signed: 10/31/2020 6:06:42 PM By: Baruch Gouty RN, BSN Entered By: Baruch Gouty on 10/31/2020 11:08:22 -------------------------------------------------------------------------------- Pain Assessment Details Patient Name: Date of Service: [name], [name] 10/31/2020 10:30 A M Medical Record Number: [id number] Patient Account Number: [id number] Date of Birth/Sex: Treating RN: May 24, 1956 (65 y.o. [name] Primary Care Ferron Ishmael: [name] Other Clinician: Referring Ludie Pavlik: Treating Bresha Hosack/Extender: [name] in Treatment: 52 Active Problems Location of Pain Severity and Description of Pain Patient Has Paino No Site Locations Pain Management and Medication Current Pain Management: Electronic Signature(s) Signed: 10/31/2020 6:06:42 PM By: Baruch Gouty RN, BSN Signed: 11/01/2020 1:41:47 PM By: [name] Entered By: [name] on 10/31/2020 10:32:01 -------------------------------------------------------------------------------- Patient/Caregiver Education Details Patient Name: Date of Service: [name], [name] 6/1/2022andnbsp10:30 [place] Record Number: [id number] Patient Account Number: [id number] Date of Birth/Gender: Treating RN: 10-21-1955 (65 y.o. [name] Primary Care Physician: [name] Other Clinician: Referring Physician: Treating Physician/Extender: [name] in Treatment: 42 Education Assessment Education Provided To: Patient Education Topics Provided Venous: Methods: Explain/Verbal Responses: Reinforcements needed, State content correctly Wound/Skin Impairment: Methods: Explain/Verbal Responses: Reinforcements needed, State content correctly Electronic Signature(s) Signed: 10/31/2020 6:06:42  PM By: Baruch Gouty RN, BSN Entered By: Baruch Gouty on 10/31/2020 11:08:50 -------------------------------------------------------------------------------- Wound Assessment Details Patient Name: Date of Service: [name], [name] 10/31/2020 10:30 A M Medical Record Number: [id number] Patient Account Number: [id number] Date of Birth/Sex: Treating RN: [birth year] (65 y.o. [name] Primary Care Lashea Goda: [name] Other Clinician: Referring Trino Higinbotham: Treating Tam Delisle/Extender: [name] in Treatment: 23 Wound Status Wound Number: 15 Primary Venous Leg Ulcer Etiology: Wound Location: Left, Medial Malleolus Wound Status: Open Wounding Event: Gradually Appeared Comorbid Peripheral Venous Disease, Osteoarthritis, Confinement Date Acquired: 12/30/2019 History: Anxiety Weeks Of Treatment: 41 Clustered Wound: No Photos Wound Measurements Length: (cm) 3.5 Width: (cm) 2.5 Depth: (cm) 0.1 Area: (cm) 6.872 Volume: (cm) 0.687 % Reduction in Area: 41.1% % Reduction in Volume: 92.6% Epithelialization: Small (1-33%) Tunneling: No Undermining: No Wound Description Classification: Full Thickness With Exposed Support Structures Wound Margin: Flat and Intact Exudate Amount: Medium Exudate Type: Serosanguineous Exudate Color: red, brown Foul Odor After Cleansing: No Slough/Fibrino Yes Wound Bed Granulation Amount: Medium (34-66%) Exposed Structure Granulation Quality: Red Fascia Exposed: No Necrotic Amount: Medium (34-66%) Fat Layer (Subcutaneous Tissue) Exposed: Yes Necrotic Quality: Adherent Slough Tendon Exposed: No Muscle Exposed: No Joint Exposed: No Bone Exposed: No  Treatment Notes Wound #15 (Malleolus) Wound Laterality: Left, Medial Cleanser Soap and Water Discharge Instruction: May shower and wash wound with dial antibacterial soap and water prior to dressing change. Peri-Wound Care Triamcinolone 15 (g) Discharge  Instruction: Use triamcinolone 15 (g) mixed with lotion Sween Lotion (Moisturizing lotion) Discharge Instruction: Apply moisturizing lotion as directed Topical Primary Dressing Secondary Dressing Woven Gauze Sponge, Non-Sterile 4x4 in Discharge Instruction: Apply over primary dressing as directed. ABD Pad, 5x9 Discharge Instruction: Apply over primary dressing as directed. Secured With Compression Wrap Unnaboot w/Calamine, 4x10 (in/yd) Discharge Instruction: Apply Unnaboot as directed. Compression Stockings Add-Ons Electronic Signature(s) Signed: 11/01/2020 1:41:47 PM By: [name] Signed: 11/01/2020 5:38:56 PM By: Baruch Gouty RN, BSN Previous Signature: 10/31/2020 6:06:42 PM Version By: Baruch Gouty RN, BSN Entered By: [name] on 11/01/2020 12:54:22 -------------------------------------------------------------------------------- Vitals Details Patient Name: Date of Service: [name], [name] 10/31/2020 10:30 A M Medical Record Number: [id number] Patient Account Number: [id number] Date of Birth/Sex: Treating RN: Jul 15, 1955 (65 y.o. [name] Primary Care Florenda Watt: [name] Other Clinician: Referring Senita Corredor: Treating Martha Ellerby/Extender: [name] in Treatment: 52 Vital Signs Time Taken: 10:28 Temperature (F): 97.9 Height (in): 68 Pulse (bpm): 53 Respiratory Rate (breaths/min): 17 Blood Pressure (mmHg): 119/48 Reference Range: 80 - 120 mg / dl Electronic Signature(s) Signed: 11/01/2020 1:41:47 PM By: [name] Entered By: [name] on 10/31/2020 10:31:39

## 2020-11-06 NOTE — Progress Notes (Addendum)
[name], [name] ([id number]) Visit Report for 10/31/2020 Chief Complaint Document Details Patient Name: Date of Service: [facility] [name], [name] 10/31/2020 10:30 A M Medical Record Number: [id number] Patient Account Number: [id number] Date of Birth/Sex: Treating RN: 05-Apr-1956 (65 y.o. [name] Provider: [name] Other Clinician: Referring Provider: Treating Provider/Extender: [name] in Treatment: 50 Information Obtained from: Patient Chief Complaint patient is been followed long-term in this clinic for venous insufficiency ulcers with inflammation, hypertension and ulceration over the medial ankle bilaterally. 01/17/2020; this is a patient who is here for review of postoperative wounds on the left lateral ankle and recurrence of venous stasis ulceration on the left medial Electronic Signature(s) Signed: 10/31/2020 11:04:47 AM By: [name] PA-C Entered By: [name] on 10/31/2020 11:04:47 -------------------------------------------------------------------------------- HPI Details Patient Name: Date of Service: [name], ELEA NO R [name]. 10/31/2020 10:30 A M Medical Record Number: [id number] Patient Account Number: [id number] Date of Birth/Sex: Treating RN: 03/23/1956 (65 y.o. [name] Provider: [name] Other Clinician: Referring Provider: Treating Provider/Extender: [name] in Treatment: 63 History of Present Illness HPI Description: the remaining wound is over the left medial ankle. Similar wound over the right medial ankle healed largely with use of Apligraf. Most recently we have been using Hydrofera Blue over this wound with considerable improvement. The patient has been extensively worked up in the past for her venous insufficiency and she is not a candidate for antireflux surgery although I have none of the details available currently. 08/24/14; considerable  improvement today. About 50% of this wound areas now epithelialized. The base of the wound appears to be healthier granulation.as opposed to last week when she had deteriorated a considerable improvement 08/17/14; unfortunately the wound has regressed somewhat. The areas of epithelialization from the superior aspect are not nearly as healthy as they were last week. The patient thinks her Hydrofera Blue slipped. 09/07/14; unfortunately the area has markedly regressed in the 2 weeks since I've seen this. There is an odor surrounding erythema. The healthy granulation tissue that we had at the base of the wound now is a dusky color. The nurse reports green drainage 09/14/14; the area looks somewhat better than last week. There is less erythema and less drainage. The culture I did did not show any growth. Nevertheless I think it is better to continue the Cipro and doxycycline for a further week. The remaining wound area was debridement. 09/21/14. Wound did not require debridement last week. Still less erythema and less drainage. She can complete her antibiotics. The areas of epithelialization in the superior aspect of the wound do not look as healthy as they did some weeks ago 10/05/14 continued improvement in the condition of this wound. There is advancing epithelialization. Less aggressive debridement required 10/19/14 continued improvement in the condition and volume of this wound. Less aggressive debridement to the inferior part of this to remove surface slough and fibrinous eschar 11/02/14 no debridement is required. The surface granulation appears healthy although some of her islands of epithelialization seem to have regressed. No evidence of infection 11/16/14; lites surface debridement done of surface eschar. The wound does not look to be unhealthy. No evidence of infection. Unfortunately the patient has had podiatry issues in the right foot and for some reason has redeveloped small surface ulcerations in  the medial right ankle. Her original presentation involved wounds in this area 11/23/14 no debridement. The area on the  right ankle has enlarged. The left ankle wound appears stable in terms of the surface although there is periwound inflammation. There has been regression in the amount of new skin 11/30/14 no debridement. Both wound areas appear healthy. There was no evidence of infection. The the new area on the right medial ankle has enlarged although that both the surfaces appear to be stable. 12/07/14; Debridement of the right medial ankle wound. No no debridement was done on the left. 12/14/14 no major change in and now bilateral medial ankle wounds. Both of these are very painful but the no overt evidence of infection. She has had previous venous ablation 12/21/14; patient states that her right medial ankle wound is considerably more painful last week than usual. Her left is also somewhat painful. She could not tolerate debridement. The right medial ankle wound has fibrinous surface eschar 12/28/14 this is a patient with severe bilateral venous insufficiency ulcers. For a considerable period of time we actually had the one on the right medial ankle healed however this recently opened up again in June. The left medial ankle wound has been a refractory area with some absent flows. We had some success with Hydrofera Blue on this area and it literally closed by 50% however it is recently opened up Foley. Both of these were debridement today of surface eschar. She tolerates this poorly 01/25/15: No change in the status of this. Thick adherent escar. Very poor tolerance of any attempt at debridement. I had healed the right medial malleolus wound for a considerable amount of time and had the left one down to about 50% of the volume although this is totally regressed over the last 48 weeks. Further the right leg has reopened. she is trying to make a appointment with pain and vascular, previous ablations with  Dr. Aleda Grana. I do not believe there is an arterial insufficiency issue here 02/01/15 the status of the adherent eschar bilaterally is actually improved. No debridement was done. She did not manage to get vascular studies done 02/08/15 continued debridement of the area was done today. The slough is less adherent and comes off with less pressure. There is no surrounding infection peripheral pulses are intact 02/15/15 selective debridement with a disposable curette. Again the slough is less adherent and comes off with less difficulty. No surrounding infection peripheral pulses are intact. 02/22/15 selective debridement of the right medial ankle wound. Slough comes off with less difficulty. No obvious surrounding infection peripheral pulses are intact I did not debridement the one on the left. Both of these are stable to improved 03/01/15 selective debridement of both wound areas using a curette to. Adherent slough cup soft with less difficulty. No obvious surrounding infection. The patient tells me that 2 days ago she noted a rash above the right leg wrap. She did not have this on her lower legs when she change this over she arrives with widespread left greater than right almost folliculitis-looking rash which is extremely pruritic. I don't see anything to culture here. There is no rash on the rest of her body. She feels well systemically. 03/08/15; selective debridement of both wounds using a curette. Base of this does not look unhealthy. She had limegreen drainage coming out of the left leg wound and describes a lot of drainage. The rash on her left leg looks improved to. No cultures were done. 03/22/15; patient was not here last week. Basal wounds does not look healthy and there is no surrounding erythema. No drainage. There is still a  rash on the left leg that almost looks vasculitic however it is clearly limited to the top of where the wrap would be. 04/05/15; on the right required a surgical  debridement of surface eschar and necrotic subcutaneous tissue. I did not debridement the area on the left. These continue to be large open wounds that are not changing that much. We were successful at one point in healing the area on the right, and at the same time the area on the left was roughly half the size of current measurements. I think a lot of the deterioration has to do with the prolonged time the patient is on her feet at work 04/19/15 I attempted-like surface debridement bilaterally she does not tolerate this. She tells me that she was in allergic care yesterday with extreme pain over her left lateral malleolus/ankle and was told that she has an "sprain" 05/03/15; large bilateral venous insufficiency wounds over the medial malleolus/medial aspect of her ankles. She complains of copious amounts of drainage and his usual large amounts of pain. There is some increasing erythema around the wound on the right extending into the medial aspect of her foot to. historically she came in with these wounds the right one healed and the left one came down to roughly half its current size however the right one is reopened and the left is expanded. 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 05/10/15 large bilateral venous insufficiency wounds. There is less adherence surface left however the surface culture that I did last week grew pseudomonas therefore bilateral selective debridement score necessary. There is surrounding erythema. The patient describes severe bilateral drainage and a lot of pain in the left ankle. Apparently her podiatrist was were ready to do a cortisone shot 05/17/15; the patient complains of pain and again copious amounts of drainage. 05/24/15; we used Iodo flex last week. Patient notes considerable improvement in wound drainage. Only needed to change this once. 05/31/15; we continued Iodoflex; the base of these large wounds  bilaterally is not too bad but there is probably likely a significant bioburden here. I would like to debridement just doesn't tolerate it. 06/06/14 I would like to continue the Iodoflex although she still hasn't managed to obtain supplies. She has bilateral medial malleoli or large wounds which are mostly superficial. Both of them are covered circumferentially with some nonviable fibrinous slough although she tolerates debridement very poorly. She apparently has an appointment for an ablation on the right leg by interventional radiology. 06/14/15; the patient arrives with the wounds and static condition. We attempted a debridement although she does not do well with this secondary to pain. I 07/05/15; wounds are not much smaller however there appears to be a cleaner granulating base. The left has tight fibrinous slough greater than the right. Debridement is tolerated poorly due to pain. Iodoflex is done more for these wounds in any of the multitude of different dressings I have tried on the left 1 and then subsequently the right. 07/12/15; no change in the condition of this wound. I am able to do an aggressive debridement on the right but not the left. She simply cannot tolerate it. We have been using Iodoflex which helps somewhat. It is worthwhile remembering that at one point we healed the right medial ankle wound and the left was about 25% of the current circumference. We have suggested returning to vascular surgery for review of possible further ablations for  one reason or another she has not been able to do this. 07/26/15 no major change in the condition of either wound on her medial ankle. I did not attempt to debridement of these. She has been aggressively scrubbing these while she is in the shower at home. She has her supply of Iodoflex which seems to have done more for these wounds then anything I have put on recently. 08/09/15 wound area appears larger although not verified by measurements. Using  Iodoflex 09/05/2015 -- she was here for avisit today but had significant problems with the wound and I was asked to see her for a physician opinion. I have summarize that this lady has had surgery on her left lower extremity about 10 years ago where the possible veins stripping was done. She has had an opinion from interventional radiology around November 2016 where no further sclerotherapy was ordered. The patient works 12 hours a day and stands on a concrete floor with work boots and is unable to get the proper compression she requires and cannot elevate her limbs appropriately at any given time. She has recently grown Pseudomonas from her wound culture but has not started her ciprofloxacin which was called in for her. 09/13/15 this continues to be a difficult situation for this patient. At one point I had this wound down to a 1.5 x 1.5" wound on her left leg. This is deteriorated and the right leg has reopened. She now has substantial wounds on her medial calcaneus, malleoli and into her lower leg. One on the left has surface eschar but these are far too painful for me to debridement here. She has a vascular surgery appointment next week to see if anything can be done to help here. I think she has had previous ablations several years ago at [facility]. She has no major edema. She tells me that she did not get product last time [facility]] and went for several days without it. She continues to work in work boots 12 hours a day. She cannot get compression/4-layer under her work boots. 09/20/15 no major change. Periwound edema control was not very good. Her point with pain and vascular is next Wednesday the 25th 09/28/15; the patient is seen vascular surgery and is apparently scheduled for repeat duplex ultrasounds of her bilateral lower legs next week. 10/05/15; the patient was seen by Dr. Doren Custard of vascular surgery. He feels that she should have arterial insufficiency excluded as cause/contributed to  her nonhealing stage she is therefore booked for an arteriogram. She has apparently monophasic signals in the dorsalis pedis pulses. She also of course has known severe chronic venous insufficiency with previous procedures as noted previously. I had another long discussion with the patient today about her continuing to work 12 hour shifts. I've written her out for 2 months area had concerns about this as her work location is currently undergoing significant turmoil and this may lead to her termination. She is aware of this however I agree with her that she simply cannot continue to stand for 12 hours multiple days a week with the substantial wound areas she has. 10/19/15; the Dr. Doren Custard appointment was largely for an arteriogram which was normal. She does not have an arterial issue. He didn't make a comment about her chronic venous insufficiency for which she has had previous ablations. Presumably it was not felt that anything additional could be done. The patient is now out of work as I prescribed 2 weeks ago. Her wounds look somewhat less  aggravated presumably because of this. I felt I would give debridement another try today 10/25/15; no major change in this patient's wounds. We are struggling to get her product that she can afford into her own home through her insurance. 11/01/15; no major change in the patient's wounds. I have been using silver alginate as the most affordable product. I spoke to Dr. Marla Roe last week with her requested take her to the OR for surgical debridement and placement of ACEL. Dr. Marla Roe told me that she would be willing to do this however [facility] will not cover this, fortunately the patient has [place] healthcare of some variant 11/08/15; no major change in the patient's wounds. She has been completely nonviable surface that this but is in too much pain with any attempted debridement are clinic. I have arranged for her to see Dr. Marla Roe ham of plastic  surgery and this appointment is on Monday. I am hopeful that they will take her to the OR for debridement, possible ACEL ultimately possible skin graft 11/22/15 no major change in the patient's wounds over her bilateral medial calcaneus medial malleolus into the lower legs. Surface on these does not look too bad however on the left there is surrounding erythema and tenderness. This may be cellulitis or could him sleepy tinea. 11/29/15; no major changes in the patient's wounds over her bilateral medial malleolus. There is no infection here and I don't think any additional antibiotics are necessary. There is now plan to move forward. She sees Dr. Marla Roe in a week's time for preparation for operative debridement and ACEL placement I believe on 7/12. She then has a follow-up appointment with Dr. Marla Roe on 7/21 12/28/15; the patient returns today having been taken to the [place] by Dr. Marla Roe 12/12/15 she underwent debridement, intraoperative cultures [which were negative]. She had placement of a wound VAC. Parent really ACEL was not available to be placed. The wound VAC foam apparently adhered to the wound since then she's been using silver alginate, Xeroform under Ace wraps. She still says there is a lot of drainage and a lot of pain 01/31/16; this is a patient I see monthly. I had referred her to Dr. Marla Roe him of plastic surgery for large wounds on her bilateral medial ankles. She has been to the OR twice once in early July and once in early August. She tells me over the last 3 weeks she has been using the wound VAC with ACEL underneath it. On the right we've simply been using silver alginate. Under Kerlix Coban wraps. 02/28/16; this is a patient I'm currently seeing monthly. She is gone on to have a skin graft over her large venous insufficiency ulcer on the left medial ankle. This was done by Dr. Marla Roe him. The patient is a bit perturbed about why she didn't have one on her right medial  ankle wound. She has been using silver alginate to this. 03/06/16; I received a phone call from her plastic surgery Dr. Marla Roe. She expressed some concern about the viability of the skin graft she did on the left medial ankle wound. Asked me to place Endoform on this. She told me she is not planning to do a subsequent skin graft on the right as the left one did not take very well. I had placed Hydrofera Blue on the right 03/13/16; continue to have a reasonably healthy wound on the right medial ankle. Down to 3 mm in terms of size. There is epithelialization here. The area on the  left medial ankle is her skin graft site. I suppose the last week this looks somewhat better. She has an open area inferiorly however in the center there appears to be some viable tissue. There is a lot of surface callus and eschar that will eventually need to come off however none of this looked to be infected. Patient states that the is able to keep the dressing on for several days which is an improvement. 03/20/16 no major change in the circumference of either wound however on the left side the patient was at Dr. Eusebio Friendly office and they did a debridement of left wound. 50% of the wound seems to be epithelialized. I been using Endoform on the left Hydrofera Blue in the right 03/27/16; she arrives today with her wound is not looking as healthy as they did last week. The area on the right clearly has an adherent surface to this a very similar surface on the left. Unfortunately for this patient this is all too familiar problem. Clearly the Endoform is not working and will need to change that today that has some potential to help this surface. She does not tolerate debridement in this clinic very well. She is changing the dressing wants 04/03/16; patient arrives with the wounds looking somewhat better especially on the right. Dr. Migdalia Dk change the dressing to silver alginate when she saw her on Monday and also sold her  some compression socks. The usefulness of the latter is really not clear and woman with severely draining wounds. 04/10/16; the patient is doing a bit of an experiment wearing the compression stockings that Dr. Migdalia Dk provided her to her left leg and the out of legs based dressings that we provided to the right. 05/01/16; the patient is continuing to wear compression stockings Dr. Migdalia Dk provided her on the left that are apparently silver impregnated. She has been using Iodoflex to the right leg wound. Still a moderate amount of drainage, when she leaves here the wraps only last for 4 days. She has to change the stocking on the left leg every night 05/15/16; she is now using compression stockings bilaterally provided by Dr. Marla Roe. She is wearing a nonadherent layer over the wounds so really I don't think there is anything specific being done to this now. She has some reduction on the left wound. The right is stable. I think all healing here is being done without a specific dressing 06/09/16; patient arrives here today with not much change in the wound certainly in diameter to large circular wounds over the medial aspect of her ankle bilaterally. Under the light of these services are certainly not viable for healing. There is no evidence of surrounding infection. She is wearing compression stockings with some sort of silver impregnation as prescribed by Dr. Marla Roe. She has a follow-up with her tomorrow. 06/30/16; no major change in the size or condition of her wounds. These are still probably covered with a nonviable surface. She is using only her purchase stockings. She did see Dr. Marla Roe who seemed to want to apply Dakin's solution to this I'm not extreme short what value this would be. I would suggest Iodoflex which she still has at home. 07/28/16; I follow Mrs. Risk episodically along with Dr. Marla Roe. She has very refractory venous insufficiency wounds on her bilateral medial legs  left greater than right. She has been applying a topical collagen ointment to both wounds with Adaptic. I don't think Dr. Marla Roe is planning to take her back to the OR. 08/19/16;  I follow Mrs. Jeneen Rinks on a monthly basis along with Dr. Marla Roe of plastic surgery. 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 I been following her for a number of years. At one point I was able to get the right medial malleolus wound to heal and had the left medial malleolus down to about half its current size however and I had to send her to plastic surgery for an operative debridement. Since then things have been stable to slightly improve the area on the right is slightly better one in the left about the same although there is much less adherent surface than I'm used to with this patient. She is using some form of liquid collagen gel that Dr. Marla Roe provided a Kerlix cover with the patient's own pressure stockings. She tells me that she has extreme pain in both ankles and along the lateral aspect of both feet. She has been unable to work for some period of time. She is telling me she is retiring at the beginning of April. She sees Dr. Doran Durand of orthopedics next week 09/22/16; patient has not seen Dr. Marla Roe since the last time she is here. I'm not really sure what she is using to the wounds other than bits and pieces of think she had left over including most recently Hydrofera Blue. She is using juxtalite stockings. She is having difficulty with her husband's recent illness "stroke". She is having to transport him to various doctors appointments. Dr. Marla Roe left her the option of a repeat debridement with ACEL however she has not been able to get the time to follow-up on this. She continues to have a fair amount of drainage out of these wounds with certainly precludes leaving dressings on all week 10/13/16; patient has not seen Dr.  Marla Roe since she was last in our clinic. I'm not really sure what she is doing with the wounds, we did try to get her [facility] and I think she is actually using this most of the time. Because of drainage she states she has to change this every second day although this is an improvement from what she used to do. She went to see Dr. Doran Durand who did not think she had a muscular issue with regards to her feet, he referred her to a neurologist and I think the appointment is sometime in June. I changed her back to Iodoflex which she has used in the past but not recently. 11/03/16; the patient has been using Iodoflex although she ran out of this. Still claims that there is a lot of drainage although the wound does not look like this. No surrounding erythema. She has not been back to see Dr. Marla Roe 11/24/16; the patient has been using Iodoflex again but she ran out of it 2 or 3 days ago. There is no major change in the condition of either one of these wounds in fact they are larger and covered in a thick adherent surface slough/nonviable tissue especially on the left. She does not tolerate mechanical debridement in our clinic. Going back to see Dr. Marla Roe of plastic surgery for an operative debridement would seem reasonable. 12/15/16; the patient has not been back to see Dr. Marla Roe. She is been dealing with a series of illnesses and her husband which of monopolized her time. She is been using Sorbact which we largely supplied. She states the drainage is bad enough that it maximum she can go 2-3 days without changing the  dressing 01/12/2017 -- the patient has not been back for about 4 weeks and has not seen Dr. Marla Roe not does she have any appointment pending. 01/23/17; patient has not seen Dr. Marla Roe even though I suggested this previously. She is using Santyl that was suggested last week by Dr. Con Memos this Cost her $16 through her insurance which is indeed surprising 02/12/17;  continuing Santyl and the patient is changing this daily. A lot of drainage. She has not been back to see plastic surgery she is using an Ace wrap. Our intake nurse suggested wrap around stockings which would make a good reasonable alternative 02/26/17; patient is been using Santyl and changing this daily due to drainage. She has not been to see plastic surgery she uses in April Ace wrap to control the edema. She did obtain extremitease stockings but stated that the edema in her leg was to big for these 03/20/17; patient is using Santyl and Anasept. Surfaces looked better today the area on the right is actually measuring a little smaller. She has states she has a lot of pain in her feet and ankles and is asking for a consult to pain control which I'll try to help her with through our case manager. 04/10/17; the patient arrives with better-looking wound surfaces and is slightly smaller wound on the left she is using a combination of Santyl and Anasept. She has an appointment or at least as started in the pain control center associated with [facility] 05/14/17; this is a patient who I followed for a prolonged period of time. 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 At one point I had this down to a much smaller wound on the left however these reopened and we've never been able to get these to heal. She has been using Santyl and Anasept gel although 2 weeks ago she ran out of the Anasept gel. She has a stable appearance of the wound. She is going to the wound care clinic at [facility]. They wanted do a nerve block/spinal block although she tells me she is reluctant to go forward with that. 05/21/17; this is a patient I have followed for many years. 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 Chronic pain and deformity in her ankles as well. She is been to see plastic surgery as well as orthopedics. Using PolyMem AG most recently/Kerramax/ABDs  and 2 layer compression. She has managed to keep this on and she is coming in for a nurse check to change the dressing on Tuesdays, we see her on Fridays 06/05/17; really quite a good looking surface and the area especially on the right medial has contracted in terms of dimensions. Well granulated healthy-looking tissue on both sides. Even with an open curet there is nothing that even feels abnormal here. This is as good as I've seen this in quite some time. We have been using PolyMem AG and bringing her in for a nurse check 06/12/17; really quite good surface on both of these wounds. The right medial has contracted a bit left is not. We've been using PolyMem and AG and she is coming in for a nurse visit 06/19/17; we have been using PolyMem AG and bringing her in for a nurse check. Dimensions of her wounds are not better but the surfaces looked better bilaterally. She complained of bleeding last night and the left wound and increasing pain bilaterally. She states her wound pain is more neuropathic than just the wounds. There was some  suggestion that this was radicular from her pain management doctor in talking to her it is really difficult to sort this out. 06/26/17; using PolyMem and AG and bringing her in for a nurse check as All of this and reasonably stable condition. Certainly not improved. The dimensions on the lateral part of the right leg look better but not really measuring better. The medial aspect on the left is about the same. 07/03/16; we have been using PolyMen AG and bringing her in for a nurse check to change the dressings as the wounds have drainage which precludes once weekly changing. We are using all secondary absorptive dressings.our intake nurse is brought up the idea of using a wound VAC/snap VAC on the wound to help with the drainage to see if this would result in some contraction. This is not a bad idea. The area on the right medial is actually looking smaller. Both wounds have a  reasonable-looking surface. There is no evidence of cellulitis. The edema is well controlled 07/10/17; the patient was denied for a snap VAC by her insurance. The major issue with these wounds continues to be drainage. We are using wicked PolyMem AG and she is coming in for a nurse visit to change this. The wounds are stable to slightly improved. The surface looks vibrant and the area on the right certainly has shrunk in size but very slowly 07/17/17; the patient still has large wounds on her bilateral medial malleoli. Surface of both of these wounds looks better. The dimensions seem to come and go but no consistent improvement. There is no epithelialization. We do not have options for advanced treatment products due to insurance issues. They did not approve of the wound VAC to help control the drainage. More recently we've been using PolyMem and AG wicked to allow drainage through. We have been bringing her in for a nurse visit to change this. We do not have a lot of options for wound care products and the home again due to insurance issues 07/24/17; the patient's wound actually looks somewhat better today. No drainage measurements are smaller still healthy-looking surface. We used silver collagen under PolyMen started last week. We have been bringing her in for a dressing change 07/31/17; patient's wound surface continued to look better and I think there is visible change in the dimensions of the wound on the right. Rims of epithelialization. We have been using silver collagen under PolyMen and bringing her in for a dressing change. There appears to be less drainage although she is still in need of the dressing change 08/07/17. Patient's wound surface continues to look better on both sides and the area on the right is definitely smaller. We have been using silver collagen and PolyMen. She feels that the drainage has been it has been better. I asked her about her vascular status. She went to see Dr.  Aleda Grana at [facility] and had some form of ablation. I don't have much detail on this. I haven't my notes from 2016 that she was not a candidate for any further ablation but I don't have any more information on this. We had referred her to vein and vascular I don't think she ever went. He does not have a history of PAD although I don't have any information on this either. We don't even have ABIs in our record 08/14/17; we've been using silver collagen and PolyMen cover. And putting the patient and compression. She we are bringing her in as a nurse visit to change  this because ofarge amount of drainage. We didn't the ABIs in clinic today since they had been done in many moons 1.2 bilaterally. She has been to see vein and vascular however this was at [facility] and she had ablation although I really don't have any information on this all seemed biking get a report. She is also been operatively debrided by plastic surgery and had a cell placed probably 8-12 months ago. This didn't have a major effect. We've been making some gains with current dressings 08/19/17-She is here in follow-up evaluation for bilateral medial malleoli ulcers. She continues to tolerate debridement very poorly. We will continue with recently changed topical treatment; if no significant improvement may consider switching to Iodosorb/Iodoflex. 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 She has been using silver collagen and PolyMem. I believe she has been used and tried on Iodoflex before. 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 These have been very refractory over time. We have been using silver collagen and PolyMen. She is coming in once a week for a doctor's and once a week for  nurse visits. We are actually making some progress 09/18/17; the patient's wounds are smaller especially on the right medial. She arrives today to upset to consider even washing these off with Anasept which I think is been part of the reason this is been closing. We've been using collagen covered in PolyMen otherwise. It is noted that she has a small area of folliculitis on the right medial calf that. As we are wrapping her legs I'll give her a short course of doxycycline to make sure this doesn't amount to anything. She is a long list of complaints today including imbalance, shortness of breath on exertion, inversion of her left ankle. With regards to the latter complaints she is been to see orthopedics and they offered her a tendon release surgery I believe but wanted her wounds to be closed first. I have recommended she go see her primary physician with regards to everything else. 09/25/17; patient's wounds are about the same size. We have made some progress bilaterally although not in recent weeks. She will not allow me T wash these wounds with Anasept even if she is doing her cell. Wheeze we've been using collagen covered in PolyMen. Last week she had a small area of folliculitis this is now opened into a small wound. She completed 5 days of trimethoprim sulfamethoxazole 10/02/17; unfortunately the area on her left medial ankle is worse with a larger wound area towards the Achilles. The patient complains of a lot of pain. She will not allow debridement although visually I don't think there is anything to debridement in any case. We have been using silver collagen and PolyMen for several months now. Initially we are making some progress although I'm not really seeing that today. We will move back to [facility]. His admittedly this is a bit of a repeat however I'm hoping that his situation is different now. The patient tells me she had her leg on the left give out on her yesterday this is process  some pain. 10/09/17; the patient is seen twice a week largely because of drainage issues coming out of the chronic medial bimalleolar wounds that are chronic. Last week the dimensions of the one on the  left looks a little larger I changed her to [facility]. She comes in today with a history of terrible pain in the bilateral wound areas. She will not allow debridement. She will not even allow a tissue culture. There is no surrounding erythema no no evidence of cellulitis. We have been putting her Kerlix Coban man. She will not allow more aggressive compression as there was a suggestion to put her in 3 layer wraps. 10/16/17; large wounds on her bilateral medial malleoli. These are chronic. Not much change from last week. The surface looks have healthy but absolutely no epithelialization. A lot of pain little less so of drainage. She will not allow debridement or even washing these off in the vigorous fashion with Anasept. 10/23/17; large wounds on her bilateral malleoli which are chronic. Some improvement in terms of size perhaps on the right since last time I saw these. She states that after we increased the 3 layer compression there was some bleeding, when she came in for a nurse visit she did not want 3 layer compression put back on about our nurse managed to convince her. She has known chronic venous visit issues and I'm hoping to get her to tolerate the 3 layer compression. using Hydrofera Blue 10/30/17; absolutely no change in the condition of either wound although we've had some improvement in dimensions on the right.. Attempted to put her in 3 layer compression she didn't tolerated she is back in 2 layer compression. We've been using Hydrofera Blue We looked over her past records. She had venous reflux studies in November 2016. There was no evidence of deep venous reflux on the right. Superficial vein did not show the greater saphenous vein at think this is been previously ablated the small  saphenous vein was within normal limits. The left deep venous system showed no DVT the vessels were positive for deep venous reflux in the posterior tibial veins at the ankle. The greater saphenous vein was surgically absent small saphenous vein was within normal limits. She went to vein and vascular at [facility]. I believe she had an ablation on the left greater saphenous vein. I'll update her reflux studies perhaps ever reviewed by vein and vascular. We've made absolutely no progress in these wounds. Will also try to read and TheraSkins through her insurance 11/06/17; W the patient apparently has a 2 week follow-up with vein and vascular I like him to review the whole issue with regards to her previous vascular workup by Dr. Aleda Grana. We've really made no progress on these wounds in many months. She arrives today with less viable looking surface on the left medial ankle wound. This was apparently looking about the same on Tuesday when she was here for nurse visit. 11/13/17; deep tissue culture I did last time of the left lower leg showed multiple organisms without any predominating. In particular no Staphylococcus or group A strep were isolated. We sent her for venous reflux studies. She's had a previous left greater saphenous vein stripping and I think sclerotherapy of the right greater saphenous vein. She didn't really look at the lesser saphenous vein this both wounds are on the medial aspect. She has reflux in the common femoral vein and popliteal vein and an accessory vein on the right and the common femoral vein and popliteal vein on the left. I'm going to have her go to see vein and vascular just the look over things and see if anything else beside aggressive compression is indicated here. We have not  been able to make any progress on these wounds in spite of the fact that the surface of the wounds is never look too bad. 11/20/17; no major change in the condition of the wounds. Patient  reports a large amount of drainage. She has a lot of complaints of pain although enlisting her today I wonder if some of this at least his neuropathic rather than secondary to her wounds. She has an appointment with vein and vascular on 12/30/17. The refractory nature of these wounds in my mind at least need vein and vascular to look over the wounds the recent reflux studies we did and her history to see if anything further can be done here. I also note her gait is deteriorated quite a bit. 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 She has a bilateral Trendelenburg gait. I wonder if this is neuropathic or perhaps multilevel radicular. 11/27/17; her wounds actually looks slightly better. Healthy-looking granulation tissue a scant amount of epithelialization. [place] [employer] will not pay for [employer]. They will play for tri layer Oasis and Dermagraft. This is not a diabetic ulcer. We'll try for the tri layer Oasis. She still complains of some drainage. She has a vein and vascular appointment on 12/30/17 12/04/17; the wounds visually look quite good. Healthy-looking granulation with some degree of epithelialization. We are still waiting for response to our request for trial to try layer Oasis. Her appointment with vascular to review venous and arterial issues isn't sold the end of July 7/31. Not allow debridement or even vigorous cleansing of the wound surface. 12/18/17; slightly smaller especially on the right. 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 We've been using Hydrofera Blue. We still are looking into triple layer Oasis through her insurance 01/08/18 on evaluation today patient's wound actually appears to be showing signs of good improvement at this point in time. She has been tolerating the dressing changes without complication. Fortunately there does not appear to be any evidence of infection at this point in time. We have been utilizing silver nitrate  which does seem to be of benefit for her which is also good news. Overall I'm very happy with how things seem to be both regards appearance as well as measurement. Patient did see Dr. Bridgett Larsson for evaluation on 12/30/17. In his assessment he felt that stripping would not likely add much more than chronic compression to the patient's healing process. His recommendation was to follow-up in three months with Dr. Doren Custard if she hasn't healed in order to consider referral back to you and see vascular where she previously was in a trial and was able to get her wound to heal. I'll be see what she feels she when you staying compression and he reiterated this as well. 01/13/18 on evaluation today patient appears to actually be doing very well in regard to her bilateral medial malleolus ulcers. She seems to have tolerated the chemical cauterization with silver nitrate last week she did have some pain through that evening but fortunately states that I'll be see since it seems to be doing better she is overall pleased with the progress. 01/21/18; really quite a remarkable improvement since I've last seen these wounds. We started using silver nitrate specially on the islands of hyper granulation which for some reason her around the wound circumference. This is really done quite nicely. Primary dressing Hydrofera Blue under 4 layer compression. She seems to be able to hold out without a nurse rewrap. Follow-up in 1 week  01/28/18; we've continued the hydrofera blue but continued with chemical cauterization to the wound area that we started about a month ago for irregular hyper granulation. She is made almost stunning improvement in the overall wound dimensions. I was not really expecting this degree of improvement in these chronic wounds 02/05/18; we continue with Hydrofera Bluebut of also continued the aggressive chemical cauterization with silver nitrate. We made nice progress with the right greater than left  wound. 02/12/18. We continued with Hydrofera Blue after aggressive chemical cauterization with silver nitrate. We appear to be making nice progress with both wound areas 02/19/2018; we continue with [facility] after washing the wounds vigorously with Anasept spray and chemical cauterization with silver nitrate. We are making excellent progress. The area on the right's just about closed 02/26/2018. The area on the left medial ankle had too much necrotic debris today. I used a #5 curette we are able to get most of the soft. I continued with the silver nitrate to the much smaller wound on the right medial ankle she had a new area on her right lower pretibial area which she says was due to a role in her compression 03/05/2018; both wound areas look healthy. Not much change in dimensions from last week. I continue to use silver nitrate and Hydrofera Blue. The patient saw Dr. Doren Custard of vein and vascular. He felt she had venous stasis ulcers. He felt based on her previous arteriogram she should have adequate circulation for healing. Also she has deep venous reflux but really no significant correctable superficial venous reflux at this time. He felt we should continue with conservative management including leg elevation and compression 04/02/2018; since we last saw this woman about a month ago she had a fall apparently suffered a pelvic fracture. I did not look up the x-ray. Nevertheless because of pain she literally was bedbound for 2 weeks and had home health coming out to change the dressing. Somewhat predictably this is resulted in considerable improvement in both wound areas. The right is just about closed on the medial malleolus and the left is about half the size. 04/16/2018; both her wounds continue to go down in size. Using Hydrofera Blue. 05/07/18; both her wounds appeared to be improving especially on the right where it is almost closed. We are using Hydrofera Blue 05/14/2018; slightly worse this  week with larger wounds. Surface on the left medial not quite as good. We have been using Hydrofera Blue 05/21/18; again the wounds are slightly larger. Left medial malleolus slightly larger with eschar around the circumference. We have been using Hydrofera Blue undergoing a wraps for a prolonged period of time. This got a lot better when she was more recumbent due to a fall and a back injury. I change the primary dressing the silver alginate today. She did not tolerate a 4 layer compression previously although I may need to bring this up with her next time 05/28/2018; area on the left medial malleolus again is slightly larger with more drainage. Area on the right is roughly unchanged. She has a small area of folliculitis on the right medial just on the lower calf. This does not look ominous. 06/03/2018 left medial malleolus slightly smaller in a better looking surface. We used silver nitrate on this last time with silver alginate. The area on the right appears slightly smaller 1/10; left medial malleolus slightly smaller. Small open area on the right. We used silver nitrate and silver alginate as of 2 weeks ago. We continue with  the wound and compression. These got a lot better when she was off her feet 1/17; right medial malleolus wound is smaller. The left may be slightly smaller. Both surfaces look somewhat better. 1/24; both wounds are slightly smaller. Using silver alginate under Unna boots 1/31; both wounds appear smaller in fact the area on the right medial is just about closed. Surface eschar. We have been using silver alginate under Unna boots. The patient is less active now spends let much less time on her feet and I think this is contributed to the general improvement in the wound condition 2/7; both wounds appear smaller. I was hopeful the right medial would be closed however there there is still the same small open area. Slight amount of surface eschar on the left the dimensions are  smaller there is eschar but the wound edges appear to be free. We have been using silver alginate under Unna boot's 2/14; both wounds once again measure smaller. Circumferential eschar on the left medial. We have been using silver alginate under Unna boots with gradual improvement 2/21; the area on the right medial malleolus has healed. The area on the left is smaller. We have been using silver alginate and Unna boots. We can discharge wrapping the right leg she has 20/30 stockings at home she will need to protect the scar tissue in this area 2/28; the area on the right medial malleolus remains closed the patient has a compression stocking. The area on the left is smaller. We have been using silver alginate and Unna boots. 3/6 the area on the right medial ankle remains closed. Good edema control noted she is using her own compression stocking. The area on the left medial ankle is smaller. We have been managing this with silver alginate and Unna boots which we will continue today. 3/13; the area on the right medial ankle remains closed and I'm declaring it healed today. When necessary the left is about the same still a healthy-looking surface but no major change and wound area. No evidence of infection and using silver alginate under unna and generally making considerable improvement 3/27 the area on the right medial ankle remains closed the area on the left is about the same as last week. Certainly not any worse we have been using silver alginate under an Unna boot 4/3; the area on the right medial ankle remains closed per the patient. We did not look at this wound. The wound on the left medial ankle is about the same surface looks healthy we have been using silver alginate under an Unna boot 4/10; area on the right medial ankle remains closed per the patient. We did not look at this wound. The wound on the left medial ankle is slightly larger. The patient complains that the [facility] caused  burning pain all week. She also told us that she was a lot more active this week. Changed her back to silver alginate 4/17; right medial ankle still closed per the patient. Left medial ankle is slightly larger. Using silver alginate. She did not tolerate Hydrofera Blue on this area 4/24; right medial ankle remains closed we have not look at this. The left medial ankle continues to get larger today by about a centimeter. We have been using silver alginate under Unna boots. She complains about 4 layer compression as an alternative. She has been up on her feet working on her garden 5/8; right medial ankle remains closed we did not look at this. The left medial ankle has  increased in size about 100%. We have been using silver alginate under Unna boots. She noted increased pain this week and was not surprised that the wound is deteriorated 5/15; no major change in SA however much less erythema ( one week of doxy ocellulitis). 5/22-65 year old female returns at 1 week to the clinic for left medial ankle wound for which we have been using silver alginate under 3 layer compression She was placed on DOXY at last visit - the wound is wider at this visit. She is in 3 layer compression 5/29; change to [facility] last week. I had given her empiric doxycycline 2 weeks ago for a week. She is in 3 layer compression. She complains of a lot of pain and drainage on presentation today. 6/5; using Hydrofera Blue. I gave her doxycycline recently empirically for erythema and pain around the wound. Believe her cultures showed enterococcus which not would not have been well covered by doxycycline nevertheless the wound looks better and I don't feel specifically that the enterococcus needs to be covered. She has a new what looks like a wrap injury on her lateral left ankle. 6/12; she is using Hydrofera Blue. She has a new area on the left anterior lower tibial area. This was a wrap injury last week. 6/19; the patient is  using Hydrofera Blue. She arrived with marked inflammation and erythema around the wound and tenderness. 12/01/18 on evaluation today patient appears to be doing a little bit better based on what I'm hearing from the standpoint of lassos evaluation to this as far as the overall appearance of the wound is concerned. Then sometime substandard she typically sees Dr. Dellia Nims. Nonetheless overall very pleased with the progress that she's made up to this point. No fevers, chills, nausea, or vomiting noted at this time. 7/10; some improvement in the surface area. Aggressively debrided last week apparently. I went ahead with the debridement today although the patient does not tolerate this very well. We have been using Iodoflex. Still a fair amount of drainage 7/17; slightly smaller. Using Iodoflex. 7/24; no change from last week in terms of surface area. We have been using Iodoflex. Surface looks and continues to look somewhat better 7/31; surface area slightly smaller better looking surface. We have been using Iodoflex. This is under Unna boot compression 8/7-Patient presents at 1 week with Unna boot and Iodoflex, wound appears better 8/14-Patient presents at 1 week with Iodoflex, we use the Unna boot, wound appears to be stable better.Patient is getting Botox treatment for the inversion of the foot for tendon release, Next week 8/21; we are using Iodoflex. Unna boot. The wound is stable in terms of surface area. Under illumination there is some areas of the wound that appear to be either epithelialized or perhaps this is adherent slough at this point I was not really clear. It did not wipe off and I was reluctant to debride this today. 8/28; we are using Iodoflex in an Unna boot. Seems to be making good improvement. 9/4; using Iodoflex and wound is slightly smaller. 9/18; we are using Iodoflex with topical silver nitrate when she is here. The wound continues to be smaller 10/2; patient missed her  appointment last week due to GI issues. She left and Iodoflex based dressing on for 2 weeks. Wound is about the same size about the size of a dime on the left medial lower 10/9 we have been using Iodoflex on the medial left ankle wound. She has a new superficial probable wrap injury  on the dorsal left ankle 10/16; we have been using Hydrofera Blue since last week. This is on the left medial ankle 10/23; we have been using Hydrofera Blue since 2 weeks ago. This is on the left medial ankle. Dimensions are better 11/6; using Hydrofera Blue. I think the wound is smaller but still not closed. Left medial ankle 11/13; we have been using Hydrofera Blue. Wound is certainly no smaller this week. Also the surface not as good. This is the remanent of a very large area on her left medial ankle. 11/20; using Sorbact since last week. Wound was about the same in terms of size although I was disappointed about the surface debris 12/11; 3-week follow-up. Patient was on vacation. Wound is measuring slightly larger we have been using Sorbact. 12/18; wound is about the same size however surface looks better last week after debridement. We have been using Sorbact under compression 1/15 wound is probably twice the size of last time increased in length nonviable surface. We have been using Sorbact. She was running a mild fever and missed her appointment last week 1/22; the wound is come down in size but under illumination still a very adherent debris we have been Hydrofera Blue that I changed her to last week 1/29; dimensions down slightly. We have been using Hydrofera Blue 2/19 dimensions are the same however there is rims of epithelialization under illumination. Therefore more the surface area may be epithelialized 2/26; the patient's wound actually measures smaller. The wound looks healthy. We have been using Hydrofera Blue. I had some thoughts about running Apligraf then I still may do that however this looks so much  better this week we will delay that for now 3/5; the wound is small but about the same as last week. We have been using Hydrofera Blue. No debridement is required today. 3/19; the wound is about the size of a dime. Healthy looking wound even under illumination. We have been using Hydrofera Blue. No mechanical debridement is necessary 3/26; not much change from last week although still looks very healthy. We have been using Hydrofera Blue under Unna boots Patient was offered an ankle fusion by podiatry but not until the wound heals with a proceed with this. 4/9; the patient comes in today with her original wound on the medial ankle looking satisfactory however she has some uncontrolled swelling in the middle part of her leg with 2 new open areas superiorly just lateral to the tibia. I think this was probably a wrap issue. She said she felt uncomfortable during the week but did not call in. We have been using Hydrofera Blue 4/16; the wound on the medial ankle is about the same. She has innumerable small areas superior to this across her mid tibia. I think this is probably folliculitis. She is also been working in the yard doing a lot of sweating 4/30; the patient issue on the upper areas across her mid tibia of all healed. I think this was excessive yard work if I remember. Her wound on the medial ankle is smaller. Some debris on this we have been using Hydrofera Blue under Unna boots 5/7; mid tibia. She has been using Hydrofera Blue under an Unna wrap. She is apparently going for her ankle surgery on June 3 10/28/19-Patient returns to clinic with the ankle wound, we are using Hydrofera Blue under Unna wrap, surgery is scheduled for her left foot for June 3 so she will be back for nurse visit next week READMISSION 01/17/2020 Mrs.  [name] is a 65 year old woman we have had in this clinic for a long period of time with severe venous hypertension and refractory wounds on her medial lower legs and ankles  bilaterally. 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 When she was off her legs for a prolonged period example when she fell and suffered a compression fracture things would heal up quite nicely. She is now retired and we managed to heal up the right medial leg wound. The left one was very tiny last time I saw this although still refractory. 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 It got to the point where this was interfering with her walking and she elected to proceed with a ankle arthrodesis to straighten her her ankle and leave her with a functional outcome for mobilization. The patient was referred to Dr. Doren Custard and really this took some time to arrange. Dr. Doren Custard saw her on 12/07/2019. Once again he verified that she had no arterial issues. She had previously had an angiogram several years ago. Follow-up ABIs on the left showed an ABI of 1.12 with triphasic waveforms and a TBI of 0.92. She is felt to have chronic deep venous insufficiency but I do not think it was felt that anything could be done from about this from an ablation point of view. At the time Dr. Doren Custard saw this patient the wounds actually look closed via the pictures in his clinic. The patient finally underwent her surgery on 12/15/2019. This went reasonably well and there was a good anatomic outcome. She developed a small distal wound dehiscence on the lateral part of the surgical wound. However more problematically she is developed recurrence of the wound on the medial left ankle. There are actually 2 wounds here one in the distal lower leg and 1 pretty much at the level of the medial malleolus. It is a more distal area that is more problematic. She has been using Hydrofera Blue which started on Friday before this she was  simply Ace wrapping. There was a culture done that showed Pseudomonas and she is on ciprofloxacin. A recent CNS on 8/11 was negative. The patient reports some pain but I generally think this is improving. She is using a cam boot completely nonweightbearing using a walker for pivot transfers and a wheelchair 8/24; not much improvement unfortunately she has a surgical wound on the lateral part in the venous insufficiency wound medially. The bottom part of the medial insufficiency wound is still necrotic there is exposed tendon here. We have been using Hydrofera Blue under compression. Her edema control is however better 8/31; patient in for follow-up of his surgical wound on the lateral part of her left leg and chronic venous insufficiency ulcers medially. We put her back in compression last week. She comes in today with a complaint of 3 or 4 days worth of increasing pain. She felt her cam walker was rubbing on the area on the back of her heel. However there is intense erythema seems more likely she has cellulitis. She had 2 cultures done when she was seeing podiatry in the postop. One of them in late July showed Pseudomonas and she received a course of ciprofloxacin the other was negative on 8/11 she is allergic to penicillin with anaphylactoid complaints of hives oral  swelling via information in epic 9/9; when I saw this patient last week she had intense anterior erythema around her wound on the right lateral heel and ankle and also into the right medial heel. Some of this was no doubt drainage and her walker boot however I was convinced she had cellulitis. I gave her Levaquin and Bactrim she is finishing up on this now. She is following up with Dr. [name] he saw her yesterday. He is taken her out of the walking boot of course she is still nonweightbearing. Her x-ray was negative for any worrisome features such as soft tissue air etc. Things are a lot better this week. She has home health. We have been  using Hydrofera Blue under an [employer] which she put back on yesterday. I did not wrap her last week 9/17; her surrounding skin looks a lot better. In fact the area on the left lateral ankle has just a scant amount of eschar. The only remaining wound is the large area on the left medial ankle. Probably about 60% of this is healthy granulation at the surface however she has a significant divot distally. This has adherent debris in it. I been using debridement and silver collagen to try and get this area to fill-in although I do not think we have made much progress this week 9/24; the patient's wound on the left medial ankle looks a lot better. The deeper divot area distally still requires debridement but this is cleaning up quite nicely we have been using silver collagen. The patient is complaining of swelling in her foot and is worried that that is contributing to the nonhealing of the ankle wound. She is also complaining of numbness in her anterior toes 10/4; left medial ankle. The small area distally still has a divot with necrotic material that I have been debriding away. This has an undermining area. She is approved for Apligraf. She saw Dr. [name] her surgeon on 10/1. I think he declared himself is satisfied with the condition of things. Still nonweightbearing till the end of the month. 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 Her surgical wound is well closed. There is no evidence of infection 10/11; the patient arrived in clinic today with the expectation that we be able to put an Apligraf on this area after debridement however she arrives with a relatively large amount of green drainage on the dressing. The patient states that this started on Friday. She has not been systemically unwell. 10/19; culture I did last week showed both Enterococcus and Pseudomonas. I think this came in separate parts because I stopped her ciprofloxacin I gave her and prescribed her  linezolid however now looking at the final culture result this is Pseudomonas which is resistant to quinolones. She has not yet picked up the linezolid apparently phone issues. We are also trying to get a topical antibiotic out of [place] in [place] they can be applied by home health. She is still having green drainage 10/16; the patient has her topical antibiotic from [facility] in [place]. This is a compounded gel with vancomycin and ciprofloxacin and gentamicin. We are applying this on the wound bed with silver alginate over the top with Unna boot wraps. She arrives in clinic today with a lot less ominous looking drainage although she is only use this topical preparation once the second time today. She sees Dr. [name] her surgeon on Friday she has home health changing the dressing 11/2; still using  her compounded topical antibiotic under silver alginate. Surface is cleaning up there is less drainage. We had an Apligraf for her today and I elected to apply it. A light coating of her antibiotic 04/25/2020 upon evaluation today patient appears to be doing well with regard to her ankle ulcer. There is a little bit of slough noted on the surface of the wound I am can have to perform sharp debridement to clear this away today. With that being said other than that fact overall I feel like she is making progress and we do see some new epithelial growth. There is also some improvement in the depth of the wound and that distal portion. There is little bit of slough there as well. 12/7; 2-week follow-up. Apligraf #3. Dimensions are smaller. Closing in especially inferiorly. Still some surface debris. Still using the [facility] topical antibiotic but I told her that I don't think this needs to be renewed 12/21; 2-week follow-up. Apligraf #4 dimensions are smaller. Nice improvement 06/05/2020; 2-week follow-up. The patient's wound on the left medial ankle looks really excellent. Nice granulation.  Advancing epithelialization no undermining no evidence of infection. We would have to reapply for another Apligraf but with the condition of this wound I did not feel strongly about it. We used Hydrofera Blue under the same degree of compression. She follows up with Dr. [name] her surgeon a week Friday 06/13/2020 upon evaluation today patient appears to be doing excellent in regard to her wound. She has been tolerating the dressing changes without complication. Fortunately there is no signs of active infection at this time. No fevers, chills, nausea, vomiting, or diarrhea. She was using Hydrofera Blue last week. 06/20/2020 06/20/2020 on evaluation today patient actually appears to be doing excellent in regard to her wound. This is measuring better and looking much better as well. She has been using the collagen that seems to be doing better for her as well even though the [facility] was and is not sticking or feeling as rough on her wound. She did see Dr. [name] on Friday he is very pleased he also stated none of the hardware has shifted. That is great news 1/27; the patient has a small clean wound all that is remaining. I agree that this is too small to really consider an Apligraf. Under illumination the surface is looking quite good. We have been using collagen although the dimensions are not any better this week 2/2; the patient has a small clean wound on the left medial ankle. Although this left of her substantial original areas. Measurements are smaller. We have been using polymen Ag under an Haematologist. 2/10; small area on the left medial ankle. This looks clean nothing to debride however dimensions are about the same we have been using polymen I think now for 2 weeks 2/17; not much change in surface area. We have been using polymen Ag without any improvement. 3/17; 1 month follow-up. The patient has been using endoform without any improvement in fact I think this looks worse with more depth and  more expansion 3/24; no improvement. Perhaps less debris on the surface. We have been using Sorbact for 1 week 4/4; wound measures larger. She has edema in her leg and her foot which she tells as her wrap came down. We have been using Unna boots. Sorbact of the wound. She has been approved for Apligraf 09/12/2020 upon evaluation today patient appears to be doing well with regard to her wound. We did get the Apligraf reapproved  which is great news we have that available for application today. Fortunately there is no signs of infection and overall the patient seems to be doing great. The wound bed is nice and clean. 4/27; patient presents for her second application of Apligraf. She states over the past week she has been on her feet more often due to being outside in her garden. She has noted more swelling to her foot as a result. She denies increased warmth, pain or erythema to the wound site. 10/10/2020 upon evaluation today patient appears to be doing well with regard to her wound which does not appear to be quite as irritated as last week from what I am hearing. With that being said unfortunately she is having issues with some erythema and warmth to touch as well as an increase size. I do believe this likely is infected. 10/17/2020 upon evaluation today patient appears to be doing excellent in regard to her wound this is significantly improved as compared to last week. Fortunately I think that the infection is much better controlled at this point. She did have evidence of both Enterococcus as well as Staphylococcus noted on culture. Enterococcus really would not be helped significantly by the Cipro but the wound is doing so much better I am under the assumption that the Staphylococcus is probably the main organism that is causing the current infection. Nonetheless I think that she is doing excellent as far as that is concerned and I am very pleased in that regard. I would therefore recommend she  continue with the Cipro. 10/31/2020 upon evaluation today patient appears to be doing quite well. He was admitted to the hospital on 10/23/2020 through 10/25/2020. This was due to a cellulitis/sepsis work-up. His white blood cell count was around 19. With that being said he was treated with IV ceftriaxone and then subsequently given a discharge prescription of Augmentin twice a day for 5 more days once he was discharged. He seems to be doing quite well at this point. Fortunately there does not appear to be any signs of active infection systemically or locally based on what I see today. He does have some open areas around the end of the foot/toe region of the great toe but again this is even better than last time I saw him. Electronic Signature(s) Signed: 10/31/2020 11:08:01 AM By: [name] PA-C Entered By: [name] on 10/31/2020 11:08:01 -------------------------------------------------------------------------------- Physical Exam Details Patient Name: Date of Service: [facility] [name], ELEA NO R [name]. 10/31/2020 10:30 A M Medical Record Number: [id number] Patient Account Number: [id number] Date of Birth/Sex: Treating RN: [birth year] (65 y.o. [name] Provider: [name] Other Clinician: Referring Provider: Treating Provider/Extender: [name] in Treatment: 70 Constitutional Well-nourished and well-hydrated in no acute distress. Respiratory normal breathing without difficulty. Psychiatric this patient is able to make decisions and demonstrates good insight into disease process. Alert and Oriented x 3. pleasant and cooperative. Electronic Signature(s) Signed: 10/31/2020 11:09:23 AM By: [name] PA-C Previous Signature: 10/31/2020 11:08:31 AM Version By: [name] PA-C Entered By: [name] on 10/31/2020 11:09:23 -------------------------------------------------------------------------------- Problem List Details Patient Name:  Date of Service: [name], ELEA NO R [name]. 10/31/2020 10:30 A M Medical Record Number: [id number] Patient Account Number: [id number] Date of Birth/Sex: Treating RN: January 15, 1956 (65 y.o. [name] Provider: [name] Other Clinician: Referring Provider: Treating Provider/Extender: [name] in Treatment: 66 Active Problems ICD-10 Encounter Code  Description Active Date MDM Diagnosis I87.332 Chronic venous hypertension (idiopathic) with ulcer and inflammation of left 01/17/2020 No Yes lower extremity L97.828 Non-pressure chronic ulcer of other part of left lower leg with other specified 01/17/2020 No Yes severity L97.328 Non-pressure chronic ulcer of left ankle with other specified severity 01/17/2020 No Yes Inactive Problems ICD-10 Code Description Active Date Inactive Date L03.116 Cellulitis of left lower limb 01/31/2020 01/31/2020 T81.31XD Disruption of external operation (surgical) wound, not elsewhere classified, subsequent 01/17/2020 01/17/2020 encounter Resolved Problems Electronic Signature(s) Signed: 10/31/2020 11:04:37 AM By: [name] PA-C Entered By: [name] on 10/31/2020 11:04:37

## 2020-11-07 ENCOUNTER — Encounter (HOSPITAL_BASED_OUTPATIENT_CLINIC_OR_DEPARTMENT_OTHER): Payer: Medicare Other | Admitting: Physician Assistant

## 2020-11-07 ENCOUNTER — Other Ambulatory Visit: Payer: Self-pay

## 2020-11-07 DIAGNOSIS — I87332 Chronic venous hypertension (idiopathic) with ulcer and inflammation of left lower extremity: Secondary | ICD-10-CM | POA: Diagnosis not present

## 2020-11-07 NOTE — Progress Notes (Signed)
LATOSHIA, CZAJA ([id number]) Visit Report for 11/07/2020 Arrival Information Details Patient Name: Date of Service: [facility], [name] 11/07/2020 10:00 A M Medical Record Number: [id number] Patient Account Number: [id number] Date of Birth/Sex: Treating RN: [birth year] (65 y.o. [name] Carrissa Taitano: [name] Other Clinician: Referring Kylen Ismael: Treating Deauna Yaw/Extender: [name] in Treatment: 9 Visit Information History Since Last Visit Added or deleted any medications: No Patient Arrived: Cane Any new allergies or adverse reactions: No Arrival Time: 10:40 Had a fall or experienced change in No Accompanied By: self activities of daily living that may affect Transfer Assistance: None risk of falls: Patient Identification Verified: Yes Signs or symptoms of abuse/neglect since last visito No Secondary Verification Process Completed: Yes Hospitalized since last visit: No Patient Requires Transmission-Based Precautions: No Implantable device outside of the clinic excluding No Patient Has Alerts: Yes cellular tissue based products placed in the center Patient Alerts: L ABI =1.12, TBI = .92 since last visit: R ABI= 1.02, TBI= .58 Has Dressing in Place as Prescribed: Yes Pain Present Now: Yes Electronic Signature(s) Signed: 11/07/2020 5:19:11 PM By: [name] Entered By: [name] on 11/07/2020 10:40:39 -------------------------------------------------------------------------------- Compression Therapy Details Patient Name: Date of Service: [name], [name] 11/07/2020 10:00 A M Medical Record Number: [id number] Patient Account Number: [id number] Date of Birth/Sex: Treating RN: [birth year] (65 y.o. [name] Primary Care Johnnye Sandford: [name] Other Clinician: Referring Martice Doty: Treating Anaiah Mcmannis/Extender: [name] in Treatment: 38 Compression Therapy Performed for Wound  Assessment: Wound #15 Left,Medial Malleolus Performed By: Clinician Deon Pilling, RN Compression Type: Rolena Infante Electronic Signature(s) Signed: 11/07/2020 6:05:11 PM By: Deon Pilling Entered By: Deon Pilling on 11/07/2020 12:24:03 -------------------------------------------------------------------------------- Encounter Discharge Information Details Patient Name: Date of Service: [name], [name] 11/07/2020 10:00 [place] Record Number: [id number] Patient Account Number: [id number] Date of Birth/Sex: Treating RN: 07-20-1955 (65 y.o. [name] Primary Care Fahim Kats: Other Clinician: [name] Referring Diontay Rosencrans: Treating Kahlel Peake/Extender: [name] in Treatment: 26 Encounter Discharge Information Items Discharge Condition: Stable Ambulatory Status: Cane Discharge Destination: Home Transportation: Private Auto Accompanied By: self Schedule Follow-up Appointment: Yes Clinical Summary of Care: Electronic Signature(s) Signed: 11/07/2020 6:05:11 PM By: Deon Pilling Entered By: Deon Pilling on 11/07/2020 12:24:46 -------------------------------------------------------------------------------- Patient/Caregiver Education Details Patient Name: Date of Service: [name], [name] 6/8/2022andnbsp10:00 A M Medical Record Number: [id number] Patient Account Number: [id number] Date of Birth/Gender: Treating RN: 1956/06/02 (65 y.o. [name] Primary Care Physician: [name] Other Clinician: Referring Physician: Treating Physician/Extender: [name] in Treatment: 21 Education Assessment Education Provided To: Patient Education Topics Provided Wound/Skin Impairment: Handouts: Skin Care Do's and Dont's Methods: Explain/Verbal Responses: Reinforcements needed Electronic Signature(s) Signed: 11/07/2020 6:05:11 PM By: Deon Pilling Entered By: Deon Pilling on 11/07/2020  12:24:31 -------------------------------------------------------------------------------- Wound Assessment Details Patient Name: Date of Service: [name], [name] 11/07/2020 10:00 A M Medical Record Number: [id number] Patient Account Number: [id number] Date of Birth/Sex: Treating RN: 06-May-1956 (65 y.o. [name] Germany Chelf: [name] Other Clinician: Referring Zera Markwardt: Treating Antonin Meininger/Extender: [name] in Treatment: 42 Wound Status Wound Number: 15 Primary Etiology: Venous Leg Ulcer Wound Location: Left, Medial Malleolus Wound Status: Open Wounding Event: Gradually Appeared Date Acquired: 12/30/2019 Weeks Of Treatment: 42 Clustered Wound: No Wound Measurements Length: (cm) 3.5 Width: (cm) 2.5 Depth: (cm) 0.1 Area: (  cm) 6.872 Volume: (cm) 0.687 % Reduction in Area: 41.1% % Reduction in Volume: 92.6% Wound Description Classification: Full Thickness With Exposed Support Structures Treatment Notes Wound #15 (Malleolus) Wound Laterality: Left, Medial Cleanser Soap and Water Discharge Instruction: May shower and wash wound with dial antibacterial soap and water prior to dressing change. Peri-Wound Care Triamcinolone 15 (g) Discharge Instruction: Use triamcinolone 15 (g) mixed with lotion Sween Lotion (Moisturizing lotion) Discharge Instruction: Apply moisturizing lotion as directed Topical Primary Dressing Secondary Dressing Woven Gauze Sponge, Non-Sterile 4x4 in Discharge Instruction: Apply over primary dressing as directed. ABD Pad, 5x9 Discharge Instruction: Apply over primary dressing as directed. Secured With Compression Wrap Unnaboot w/Calamine, 4x10 (in/yd) Discharge Instruction: Apply Unnaboot as directed. Compression Stockings Add-Ons Electronic Signature(s) Signed: 11/07/2020 5:19:11 PM By: [name] Signed: 11/07/2020 6:12:56 PM By: Baruch Gouty RN, BSN Entered By: [name] on  11/07/2020 10:41:06 -------------------------------------------------------------------------------- Vitals Details Patient Name: Date of Service: [name], [name] 11/07/2020 10:00 A M Medical Record Number: [id number] Patient Account Number: [id number] Date of Birth/Sex: Treating RN: [birth year] (65 y.o. [name] Yuette Putnam: [name] Other Clinician: Referring Tahjae Clausing: Treating Shantaya Bluestone/Extender: [name] in Treatment: 57 Vital Signs Time Taken: 10:40 Temperature (F): 98.2 Height (in): 68 Pulse (bpm): 57 Respiratory Rate (breaths/min): 17 Blood Pressure (mmHg): 120/55 Reference Range: 80 - 120 mg / dl Electronic Signature(s) Signed: 11/07/2020 5:19:11 PM By: [name] Entered By: [name] on 11/07/2020 10:40:55

## 2020-11-07 NOTE — Progress Notes (Signed)
MUNNI, CONLEY ([id number]) Visit Report for 11/07/2020 SuperBill Details Patient Name: Date of Service: [facility] [name], [name] 11/07/2020 Medical Record Number: [id number] Patient Account Number: [id number] Date of Birth/Sex: Treating RN: Dec 03, 1955 (65 y.o. 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

## 2020-11-14 ENCOUNTER — Encounter (HOSPITAL_BASED_OUTPATIENT_CLINIC_OR_DEPARTMENT_OTHER): Payer: Medicare Other | Admitting: Physician Assistant

## 2020-11-14 ENCOUNTER — Other Ambulatory Visit: Payer: Self-pay

## 2020-11-14 DIAGNOSIS — I87332 Chronic venous hypertension (idiopathic) with ulcer and inflammation of left lower extremity: Secondary | ICD-10-CM | POA: Diagnosis not present

## 2020-11-14 NOTE — Progress Notes (Addendum)
SHIRAN, CLAW ([id number]) Visit Report for 11/14/2020 Chief Complaint Document Details Patient Name: Date of Service: [facility] [name], [name] 11/14/2020 10:00 A M Medical Record Number: [id number] Patient Account Number: [id number] Date of Birth/Sex: Treating RN: 1955/09/10 (65 y.o. [name] Provider: [name] Other Clinician: Referring Provider: Treating Provider/Extender: [name] in Treatment: 58 Information Obtained from: Patient Chief Complaint patient is been followed long-term in this clinic for venous insufficiency ulcers with inflammation, hypertension and ulceration over the medial ankle bilaterally. 01/17/2020; this is a patient who is here for review of postoperative wounds on the left lateral ankle and recurrence of venous stasis ulceration on the left medial Electronic Signature(s) Signed: 11/14/2020 10:47:27 AM By: [name] PA-C Entered By: [name] on 11/14/2020 10:47:27 -------------------------------------------------------------------------------- Debridement Details Patient Name: Date of Service: [name], [name] 11/14/2020 10:00 A M Medical Record Number: [id number] Patient Account Number: [id number] Date of Birth/Sex: Treating RN: Oct 13, 1955 (66 y.o. [name] Provider: [name] Other Clinician: Referring Provider: Treating Provider/Extender: [name] in Treatment: 54 Debridement Performed for Assessment: Wound #15 Left,Medial Malleolus Performed By: Physician [name], PA Debridement Type: Debridement Severity of Tissue Pre Debridement: Fat layer exposed Level of Consciousness (Pre-procedure): Awake and Alert Pre-procedure Verification/Time Out Yes - 11:05 Taken: Start Time: 11:08 Pain Control: Lidocaine 5% topical ointment T Area Debrided (L x W): otal 3.2 (cm) x 2.5 (cm) = 8 (cm) Tissue and other material debrided:  Viable, Non-Viable, Slough, Subcutaneous, Slough Level: Skin/Subcutaneous Tissue Debridement Description: Excisional Instrument: Curette Bleeding: Minimum Hemostasis Achieved: Pressure End Time: 11:13 Procedural Pain: 3 Post Procedural Pain: 2 Response to Treatment: Procedure was tolerated well Level of Consciousness (Post- Awake and Alert procedure): Post Debridement Measurements of Total Wound Length: (cm) 3.2 Width: (cm) 2.5 Depth: (cm) 0.1 Volume: (cm) 0.628 Character of Wound/Ulcer Post Debridement: Improved Severity of Tissue Post Debridement: Fat layer exposed Post Procedure Diagnosis Same as Pre-procedure Electronic Signature(s) Signed: 11/14/2020 6:34:46 PM By: Baruch Gouty RN, BSN Signed: 11/14/2020 6:55:53 PM By: [name] PA-C Entered By: Baruch Gouty on 11/14/2020 11:13:56 -------------------------------------------------------------------------------- HPI Details Patient Name: Date of Service: [name], [name] 11/14/2020 10:00 A M Medical Record Number: [id number] Patient Account Number: [id number] Date of Birth/Sex: Treating RN: [birth year] (65 y.o. [name] Provider: [name] Other Clinician: Referring Provider: Treating Provider/Extender: [name] in Treatment: 20 History of Present Illness HPI Description: the remaining wound is over the left medial ankle. Similar wound over the right medial ankle healed largely with use of Apligraf. Most recently we have been using Hydrofera Blue over this wound with considerable improvement. The patient has been extensively worked up in the past for her venous insufficiency and she is not a candidate for antireflux surgery although I have none of the details available currently. 08/24/14; considerable improvement today. About 50% of this wound areas now epithelialized. The base of the wound appears to be healthier granulation.as opposed to last week when  she had deteriorated a considerable improvement 08/17/14; unfortunately the wound has regressed somewhat. The areas of epithelialization from the superior aspect are not nearly as healthy as they were last week. The patient thinks her Hydrofera Blue slipped. 09/07/14; unfortunately the area has markedly regressed in the 2 weeks since I've seen this. There is an odor surrounding erythema. The healthy granulation tissue that we  had at the base of the wound now is a dusky color. The nurse reports green drainage 09/14/14; the area looks somewhat better than last week. There is less erythema and less drainage. The culture I did did not show any growth. Nevertheless I think it is better to continue the Cipro and doxycycline for a further week. The remaining wound area was debridement. 09/21/14. Wound did not require debridement last week. Still less erythema and less drainage. She can complete her antibiotics. The areas of epithelialization in the superior aspect of the wound do not look as healthy as they did some weeks ago 10/05/14 continued improvement in the condition of this wound. There is advancing epithelialization. Less aggressive debridement required 10/19/14 continued improvement in the condition and volume of this wound. Less aggressive debridement to the inferior part of this to remove surface slough and fibrinous eschar 11/02/14 no debridement is required. The surface granulation appears healthy although some of her islands of epithelialization seem to have regressed. No evidence of infection 11/16/14; lites surface debridement done of surface eschar. The wound does not look to be unhealthy. No evidence of infection. Unfortunately the patient has had podiatry issues in the right foot and for some reason has redeveloped small surface ulcerations in the medial right ankle. Her original presentation involved wounds in this area 11/23/14 no debridement. The area on the right ankle has enlarged. The left  ankle wound appears stable in terms of the surface although there is periwound inflammation. There has been regression in the amount of new skin 11/30/14 no debridement. Both wound areas appear healthy. There was no evidence of infection. The the new area on the right medial ankle has enlarged although that both the surfaces appear to be stable. 12/07/14; Debridement of the right medial ankle wound. No no debridement was done on the left. 12/14/14 no major change in and now bilateral medial ankle wounds. Both of these are very painful but the no overt evidence of infection. She has had previous venous ablation 12/21/14; patient states that her right medial ankle wound is considerably more painful last week than usual. Her left is also somewhat painful. She could not tolerate debridement. The right medial ankle wound has fibrinous surface eschar 12/28/14 this is a patient with severe bilateral venous insufficiency ulcers. For a considerable period of time we actually had the one on the right medial ankle healed however this recently opened up again in June. The left medial ankle wound has been a refractory area with some absent flows. We had some success with Hydrofera Blue on this area and it literally closed by 50% however it is recently opened up Foley. Both of these were debridement today of surface eschar. She tolerates this poorly 01/25/15: No change in the status of this. Thick adherent escar. Very poor tolerance of any attempt at debridement. I had healed the right medial malleolus wound for a considerable amount of time and had the left one down to about 50% of the volume although this is totally regressed over the last 48 weeks. Further the right leg has reopened. she is trying to make a appointment with pain and vascular, previous ablations with Dr. Aleda Grana. I do not believe there is an arterial insufficiency issue here 02/01/15 the status of the adherent eschar bilaterally is actually  improved. No debridement was done. She did not manage to get vascular studies done 02/08/15 continued debridement of the area was done today. The slough is less adherent and comes off with less  pressure. There is no surrounding infection peripheral pulses are intact 02/15/15 selective debridement with a disposable curette. Again the slough is less adherent and comes off with less difficulty. No surrounding infection peripheral pulses are intact. 02/22/15 selective debridement of the right medial ankle wound. Slough comes off with less difficulty. No obvious surrounding infection peripheral pulses are intact I did not debridement the one on the left. Both of these are stable to improved 03/01/15 selective debridement of both wound areas using a curette to. Adherent slough cup soft with less difficulty. No obvious surrounding infection. The patient tells me that 2 days ago she noted a rash above the right leg wrap. She did not have this on her lower legs when she change this over she arrives with widespread left greater than right almost folliculitis-looking rash which is extremely pruritic. I don't see anything to culture here. There is no rash on the rest of her body. She feels well systemically. 03/08/15; selective debridement of both wounds using a curette. Base of this does not look unhealthy. She had limegreen drainage coming out of the left leg wound and describes a lot of drainage. The rash on her left leg looks improved to. No cultures were done. 03/22/15; patient was not here last week. Basal wounds does not look healthy and there is no surrounding erythema. No drainage. There is still a rash on the left leg that almost looks vasculitic however it is clearly limited to the top of where the wrap would be. 04/05/15; on the right required a surgical debridement of surface eschar and necrotic subcutaneous tissue. I did not debridement the area on the left. These continue to be large open wounds that are  not changing that much. We were successful at one point in healing the area on the right, and at the same time the area on the left was roughly half the size of current measurements. I think a lot of the deterioration has to do with the prolonged time the patient is on her feet at work 04/19/15 I attempted-like surface debridement bilaterally she does not tolerate this. She tells me that she was in allergic care yesterday with extreme pain over her left lateral malleolus/ankle and was told that she has an "sprain" 05/03/15; large bilateral venous insufficiency wounds over the medial malleolus/medial aspect of her ankles. She complains of copious amounts of drainage and his usual large amounts of pain. There is some increasing erythema around the wound on the right extending into the medial aspect of her foot to. historically she came in with these wounds the right one healed and the left one came down to roughly half its current size however the right one is reopened and the left is expanded. 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 05/10/15 large bilateral venous insufficiency wounds. There is less adherence surface left however the surface culture that I did last week grew pseudomonas therefore bilateral selective debridement score necessary. There is surrounding erythema. The patient describes severe bilateral drainage and a lot of pain in the left ankle. Apparently her podiatrist was were ready to do a cortisone shot 05/17/15; the patient complains of pain and again copious amounts of drainage. 05/24/15; we used Iodo flex last week. Patient notes considerable improvement in wound drainage. Only needed to change this once. 05/31/15; we continued Iodoflex; the base of these large wounds bilaterally is not too bad but there is probably likely  a significant bioburden here. I would like to debridement just doesn't tolerate it. 06/06/14 I would like to  continue the Iodoflex although she still hasn't managed to obtain supplies. She has bilateral medial malleoli or large wounds which are mostly superficial. Both of them are covered circumferentially with some nonviable fibrinous slough although she tolerates debridement very poorly. She apparently has an appointment for an ablation on the right leg by interventional radiology. 06/14/15; the patient arrives with the wounds and static condition. We attempted a debridement although she does not do well with this secondary to pain. I 07/05/15; wounds are not much smaller however there appears to be a cleaner granulating base. The left has tight fibrinous slough greater than the right. Debridement is tolerated poorly due to pain. Iodoflex is done more for these wounds in any of the multitude of different dressings I have tried on the left 1 and then subsequently the right. 07/12/15; no change in the condition of this wound. I am able to do an aggressive debridement on the right but not the left. She simply cannot tolerate it. We have been using Iodoflex which helps somewhat. It is worthwhile remembering that at one point we healed the right medial ankle wound and the left was about 25% of the current circumference. We have suggested returning to vascular surgery for review of possible further ablations for one reason or another she has not been able to do this. 07/26/15 no major change in the condition of either wound on her medial ankle. I did not attempt to debridement of these. She has been aggressively scrubbing these while she is in the shower at home. She has her supply of Iodoflex which seems to have done more for these wounds then anything I have put on recently. 08/09/15 wound area appears larger although not verified by measurements. Using Iodoflex 09/05/2015 -- she was here for avisit today but had significant problems with the wound and I was asked to see her for a physician opinion. I have summarize  that this lady has had surgery on her left lower extremity about 10 years ago where the possible veins stripping was done. She has had an opinion from interventional radiology around November 2016 where no further sclerotherapy was ordered. The patient works 12 hours a day and stands on a concrete floor with work boots and is unable to get the proper compression she requires and cannot elevate her limbs appropriately at any given time. She has recently grown Pseudomonas from her wound culture but has not started her ciprofloxacin which was called in for her. 09/13/15 this continues to be a difficult situation for this patient. At one point I had this wound down to a 1.5 x 1.5" wound on her left leg. This is deteriorated and the right leg has reopened. She now has substantial wounds on her medial calcaneus, malleoli and into her lower leg. One on the left has surface eschar but these are far too painful for me to debridement here. She has a vascular surgery appointment next week to see if anything can be done to help here. I think she has had previous ablations several years ago at [facility]. She has no major edema. She tells me that she did not get product last time [facility]] and went for several days without it. She continues to work in work boots 12 hours a day. She cannot get compression/4-layer under her work boots. 09/20/15 no major change. Periwound edema control was not very good.  Her point with pain and vascular is next Wednesday the 25th 09/28/15; the patient is seen vascular surgery and is apparently scheduled for repeat duplex ultrasounds of her bilateral lower legs next week. 10/05/15; the patient was seen by Dr. Doren Custard of vascular surgery. He feels that she should have arterial insufficiency excluded as cause/contributed to her nonhealing stage she is therefore booked for an arteriogram. She has apparently monophasic signals in the dorsalis pedis pulses. She also of course has known  severe chronic venous insufficiency with previous procedures as noted previously. I had another long discussion with the patient today about her continuing to work 12 hour shifts. I've written her out for 2 months area had concerns about this as her work location is currently undergoing significant turmoil and this may lead to her termination. She is aware of this however I agree with her that she simply cannot continue to stand for 12 hours multiple days a week with the substantial wound areas she has. 10/19/15; the Dr. Doren Custard appointment was largely for an arteriogram which was normal. She does not have an arterial issue. He didn't make a comment about her chronic venous insufficiency for which she has had previous ablations. Presumably it was not felt that anything additional could be done. The patient is now out of work as I prescribed 2 weeks ago. Her wounds look somewhat less aggravated presumably because of this. I felt I would give debridement another try today 10/25/15; no major change in this patient's wounds. We are struggling to get her product that she can afford into her own home through her insurance. 11/01/15; no major change in the patient's wounds. I have been using silver alginate as the most affordable product. I spoke to Dr. Marla Roe last week with her requested take her to the OR for surgical debridement and placement of ACEL. Dr. Marla Roe told me that she would be willing to do this however [facility] will not cover this, fortunately the patient has [place] healthcare of some variant 11/08/15; no major change in the patient's wounds. She has been completely nonviable surface that this but is in too much pain with any attempted debridement are clinic. I have arranged for her to see Dr. Marla Roe ham of plastic surgery and this appointment is on Monday. I am hopeful that they will take her to the OR for debridement, possible ACEL ultimately possible skin graft 11/22/15 no  major change in the patient's wounds over her bilateral medial calcaneus medial malleolus into the lower legs. Surface on these does not look too bad however on the left there is surrounding erythema and tenderness. This may be cellulitis or could him sleepy tinea. 11/29/15; no major changes in the patient's wounds over her bilateral medial malleolus. There is no infection here and I don't think any additional antibiotics are necessary. There is now plan to move forward. She sees Dr. Marla Roe in a week's time for preparation for operative debridement and ACEL placement I believe on 7/12. She then has a follow-up appointment with Dr. Marla Roe on 7/21 12/28/15; the patient returns today having been taken to the [facility] by Dr. Marla Roe 12/12/15 she underwent debridement, intraoperative cultures [which were negative]. She had placement of a wound VAC. Parent really ACEL was not available to be placed. The wound VAC foam apparently adhered to the wound since then she's been using silver alginate, Xeroform under Ace wraps. She still says there is a lot of drainage and a lot of pain 01/31/16; this is  a patient I see monthly. I had referred her to Dr. Marla Roe him of plastic surgery for large wounds on her bilateral medial ankles. She has been to the OR twice once in early July and once in early August. She tells me over the last 3 weeks she has been using the wound VAC with ACEL underneath it. On the right we've simply been using silver alginate. Under Kerlix Coban wraps. 02/28/16; this is a patient I'm currently seeing monthly. She is gone on to have a skin graft over her large venous insufficiency ulcer on the left medial ankle. This was done by Dr. Marla Roe him. The patient is a bit perturbed about why she didn't have one on her right medial ankle wound. She has been using silver alginate to this. 03/06/16; I received a phone call from her plastic surgery Dr. Marla Roe. She expressed some concern about the  viability of the skin graft she did on the left medial ankle wound. Asked me to place Endoform on this. She told me she is not planning to do a subsequent skin graft on the right as the left one did not take very well. I had placed Hydrofera Blue on the right 03/13/16; continue to have a reasonably healthy wound on the right medial ankle. Down to 3 mm in terms of size. There is epithelialization here. The area on the left medial ankle is her skin graft site. I suppose the last week this looks somewhat better. She has an open area inferiorly however in the center there appears to be some viable tissue. There is a lot of surface callus and eschar that will eventually need to come off however none of this looked to be infected. Patient states that the is able to keep the dressing on for several days which is an improvement. 03/20/16 no major change in the circumference of either wound however on the left side the patient was at Dr. Eusebio Friendly office and they did a debridement of left wound. 50% of the wound seems to be epithelialized. I been using Endoform on the left Hydrofera Blue in the right 03/27/16; she arrives today with her wound is not looking as healthy as they did last week. The area on the right clearly has an adherent surface to this a very similar surface on the left. Unfortunately for this patient this is all too familiar problem. Clearly the Endoform is not working and will need to change that today that has some potential to help this surface. She does not tolerate debridement in this clinic very well. She is changing the dressing wants 04/03/16; patient arrives with the wounds looking somewhat better especially on the right. Dr. Migdalia Dk change the dressing to silver alginate when she saw her on Monday and also sold her some compression socks. The usefulness of the latter is really not clear and woman with severely draining wounds. 04/10/16; the patient is doing a bit of an experiment  wearing the compression stockings that Dr. Migdalia Dk provided her to her left leg and the out of legs based dressings that we provided to the right. 05/01/16; the patient is continuing to wear compression stockings Dr. Migdalia Dk provided her on the left that are apparently silver impregnated. She has been using Iodoflex to the right leg wound. Still a moderate amount of drainage, when she leaves here the wraps only last for 4 days. She has to change the stocking on the left leg every night 05/15/16; she is now using compression stockings bilaterally provided by  Dr. Marla Roe. She is wearing a nonadherent layer over the wounds so really I don't think there is anything specific being done to this now. She has some reduction on the left wound. The right is stable. I think all healing here is being done without a specific dressing 06/09/16; patient arrives here today with not much change in the wound certainly in diameter to large circular wounds over the medial aspect of her ankle bilaterally. Under the light of these services are certainly not viable for healing. There is no evidence of surrounding infection. She is wearing compression stockings with some sort of silver impregnation as prescribed by Dr. Marla Roe. She has a follow-up with her tomorrow. 06/30/16; no major change in the size or condition of her wounds. These are still probably covered with a nonviable surface. She is using only her purchase stockings. She did see Dr. Marla Roe who seemed to want to apply Dakin's solution to this I'm not extreme short what value this would be. I would suggest Iodoflex which she still has at home. 07/28/16; I follow Mrs. Cayton episodically along with Dr. Marla Roe. 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 She has been applying a topical collagen ointment to both wounds with Adaptic. I don't think Dr. Marla Roe is planning to take her back to  the OR. 08/19/16; I follow Mrs. Jeneen Rinks on a monthly basis along with Dr. Marla Roe of plastic surgery. 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 I been following her for a number of years. At one point I was able to get the right medial malleolus wound to heal and had the left medial malleolus down to about half its current size however and I had to send her to plastic surgery for an operative debridement. Since then things have been stable to slightly improve the area on the right is slightly better one in the left about the same although there is much less adherent surface than I'm used to with this patient. She is using some form of liquid collagen gel that Dr. Marla Roe provided a Kerlix cover with the patient's own pressure stockings. She tells me that she has extreme pain in both ankles and along the lateral aspect of both feet. She has been unable to work for some period of time. She is telling me she is retiring at the beginning of April. She sees Dr. Doran Durand of orthopedics next week 09/22/16; patient has not seen Dr. Marla Roe since the last time she is here. I'm not really sure what she is using to the wounds other than bits and pieces of think she had left over including most recently Hydrofera Blue. She is using juxtalite stockings. She is having difficulty with her husband's recent illness "stroke". She is having to transport him to various doctors appointments. Dr. Marla Roe left her the option of a repeat debridement with ACEL however she has not been able to get the time to follow-up on this. She continues to have a fair amount of drainage out of these wounds with certainly precludes leaving dressings on all week 10/13/16; patient has not seen Dr. Marla Roe since she was last in our clinic. I'm not really sure what she is doing with the wounds, we did try to get her [facility] and I think she is actually using  this most of the time. Because of drainage she states she has to change this every second day  although this is an improvement from what she used to do. She went to see Dr. Doran Durand who did not think she had a muscular issue with regards to her feet, he referred her to a neurologist and I think the appointment is sometime in June. I changed her back to Iodoflex which she has used in the past but not recently. 11/03/16; the patient has been using Iodoflex although she ran out of this. Still claims that there is a lot of drainage although the wound does not look like this. No surrounding erythema. She has not been back to see Dr. Marla Roe 11/24/16; the patient has been using Iodoflex again but she ran out of it 2 or 3 days ago. There is no major change in the condition of either one of these wounds in fact they are larger and covered in a thick adherent surface slough/nonviable tissue especially on the left. She does not tolerate mechanical debridement in our clinic. Going back to see Dr. Marla Roe of plastic surgery for an operative debridement would seem reasonable. 12/15/16; the patient has not been back to see Dr. Marla Roe. She is been dealing with a series of illnesses and her husband which of monopolized her time. She is been using Sorbact which we largely supplied. She states the drainage is bad enough that it maximum she can go 2-3 days without changing the dressing 01/12/2017 -- the patient has not been back for about 4 weeks and has not seen Dr. Marla Roe not does she have any appointment pending. 01/23/17; patient has not seen Dr. Marla Roe even though I suggested this previously. She is using Santyl that was suggested last week by Dr. Con Memos this Cost her $16 through her insurance which is indeed surprising 02/12/17; continuing Santyl and the patient is changing this daily. A lot of drainage. She has not been back to see plastic surgery she is using an Ace wrap. Our intake nurse suggested wrap  around stockings which would make a good reasonable alternative 02/26/17; patient is been using Santyl and changing this daily due to drainage. She has not been to see plastic surgery she uses in April Ace wrap to control the edema. She did obtain extremitease stockings but stated that the edema in her leg was to big for these 03/20/17; patient is using Santyl and Anasept. Surfaces looked better today the area on the right is actually measuring a little smaller. She has states she has a lot of pain in her feet and ankles and is asking for a consult to pain control which I'll try to help her with through our case manager. 04/10/17; the patient arrives with better-looking wound surfaces and is slightly smaller wound on the left she is using a combination of Santyl and Anasept. She has an appointment or at least as started in the pain control center associated with [facility] 05/14/17; this is a patient who I followed for a prolonged period of time. 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 At one point I had this down to a much smaller wound on the left however these reopened and we've never been able to get these to heal. She has been using Santyl and Anasept gel although 2 weeks ago she ran out of the Anasept gel. She has a stable appearance of the wound. She is going to the wound care clinic at [facility]. They wanted do a nerve block/spinal block although she tells me she is reluctant to go forward with that. 05/21/17; this  is a patient I have followed for many years. 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 Chronic pain and deformity in her ankles as well. She is been to see plastic surgery as well as orthopedics. Using PolyMem AG most recently/Kerramax/ABDs and 2 layer compression. She has managed to keep this on and she is coming in for a nurse check to change the dressing on Tuesdays, we see her on Fridays 06/05/17; really quite a  good looking surface and the area especially on the right medial has contracted in terms of dimensions. Well granulated healthy-looking tissue on both sides. Even with an open curet there is nothing that even feels abnormal here. This is as good as I've seen this in quite some time. We have been using PolyMem AG and bringing her in for a nurse check 06/12/17; really quite good surface on both of these wounds. The right medial has contracted a bit left is not. We've been using PolyMem and AG and she is coming in for a nurse visit 06/19/17; we have been using PolyMem AG and bringing her in for a nurse check. Dimensions of her wounds are not better but the surfaces looked better bilaterally. She complained of bleeding last night and the left wound and increasing pain bilaterally. She states her wound pain is more neuropathic than just the wounds. There was some suggestion that this was radicular from her pain management doctor in talking to her it is really difficult to sort this out. 06/26/17; using PolyMem and AG and bringing her in for a nurse check as All of this and reasonably stable condition. Certainly not improved. The dimensions on the lateral part of the right leg look better but not really measuring better. The medial aspect on the left is about the same. 07/03/16; we have been using PolyMen AG and bringing her in for a nurse check to change the dressings as the wounds have drainage which precludes once weekly changing. We are using all secondary absorptive dressings.our intake nurse is brought up the idea of using a wound VAC/snap VAC on the wound to help with the drainage to see if this would result in some contraction. This is not a bad idea. The area on the right medial is actually looking smaller. Both wounds have a reasonable-looking surface. There is no evidence of cellulitis. The edema is well controlled 07/10/17; the patient was denied for a snap VAC by her insurance. The major issue with  these wounds continues to be drainage. We are using wicked PolyMem AG and she is coming in for a nurse visit to change this. The wounds are stable to slightly improved. The surface looks vibrant and the area on the right certainly has shrunk in size but very slowly 07/17/17; the patient still has large wounds on her bilateral medial malleoli. Surface of both of these wounds looks better. The dimensions seem to come and go but no consistent improvement. There is no epithelialization. We do not have options for advanced treatment products due to insurance issues. They did not approve of the wound VAC to help control the drainage. More recently we've been using PolyMem and AG wicked to allow drainage through. We have been bringing her in for a nurse visit to change this. We do not have a lot of options for wound care products and the home again due to insurance issues 07/24/17; the patient's wound actually looks somewhat better today. No drainage measurements are smaller still healthy-looking surface. We used silver  collagen under PolyMen started last week. We have been bringing her in for a dressing change 07/31/17; patient's wound surface continued to look better and I think there is visible change in the dimensions of the wound on the right. Rims of epithelialization. We have been using silver collagen under PolyMen and bringing her in for a dressing change. There appears to be less drainage although she is still in need of the dressing change 08/07/17. Patient's wound surface continues to look better on both sides and the area on the right is definitely smaller. We have been using silver collagen and PolyMen. She feels that the drainage has been it has been better. I asked her about her vascular status. She went to see Dr. Aleda Grana at [facility] and had some form of ablation. I don't have much detail on this. I haven't my notes from 2016 that she was not a candidate for any further ablation but I  don't have any more information on this. We had referred her to vein and vascular I don't think she ever went. He does not have a history of PAD although I don't have any information on this either. We don't even have ABIs in our record 08/14/17; we've been using silver collagen and PolyMen cover. And putting the patient and compression. She we are bringing her in as a nurse visit to change this because ofarge amount of drainage. We didn't the ABIs in clinic today since they had been done in many moons 1.2 bilaterally. She has been to see vein and vascular however this was at [facility] and she had ablation although I really don't have any information on this all seemed biking get a report. She is also been operatively debrided by plastic surgery and had a cell placed probably 8-12 months ago. This didn't have a major effect. We've been making some gains with current dressings 08/19/17-She is here in follow-up evaluation for bilateral medial malleoli ulcers. She continues to tolerate debridement very poorly. We will continue with recently changed topical treatment; if no significant improvement may consider switching to Iodosorb/Iodoflex. 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 She has been using silver collagen and PolyMem. I believe she has been used and tried on Iodoflex before. 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 These have been very refractory over time. We have been using silver collagen and PolyMen. She is coming in once a week for a doctor's and once a week for nurse visits. We are actually making some progress 09/18/17; the patient's wounds are smaller especially on the right medial. She arrives today to upset to consider even washing these off  with Anasept which I think is been part of the reason this is been closing. We've been using collagen covered in PolyMen otherwise. It is noted that she has a small area of folliculitis on the right medial calf that. As we are wrapping her legs I'll give her a short course of doxycycline to make sure this doesn't amount to anything. She is a long list of complaints today including imbalance, shortness of breath on exertion, inversion of her left ankle. With regards to the latter complaints she is been to see orthopedics and they offered her a tendon release surgery I believe but wanted her wounds to be  closed first. I have recommended she go see her primary physician with regards to everything else. 09/25/17; patient's wounds are about the same size. We have made some progress bilaterally although not in recent weeks. She will not allow me T wash these wounds with Anasept even if she is doing her cell. Wheeze we've been using collagen covered in PolyMen. Last week she had a small area of folliculitis this is now opened into a small wound. She completed 5 days of trimethoprim sulfamethoxazole 10/02/17; unfortunately the area on her left medial ankle is worse with a larger wound area towards the Achilles. The patient complains of a lot of pain. She will not allow debridement although visually I don't think there is anything to debridement in any case. We have been using silver collagen and PolyMen for several months now. Initially we are making some progress although I'm not really seeing that today. We will move back to [facility]. His admittedly this is a bit of a repeat however I'm hoping that his situation is different now. The patient tells me she had her leg on the left give out on her yesterday this is process some pain. 10/09/17; the patient is seen twice a week largely because of drainage issues coming out of the chronic medial bimalleolar wounds that are chronic. Last week the dimensions of  the one on the left looks a little larger I changed her to [facility]. She comes in today with a history of terrible pain in the bilateral wound areas. She will not allow debridement. She will not even allow a tissue culture. There is no surrounding erythema no no evidence of cellulitis. We have been putting her Kerlix Coban man. She will not allow more aggressive compression as there was a suggestion to put her in 3 layer wraps. 10/16/17; large wounds on her bilateral medial malleoli. These are chronic. Not much change from last week. The surface looks have healthy but absolutely no epithelialization. A lot of pain little less so of drainage. She will not allow debridement or even washing these off in the vigorous fashion with Anasept. 10/23/17; large wounds on her bilateral malleoli which are chronic. Some improvement in terms of size perhaps on the right since last time I saw these. She states that after we increased the 3 layer compression there was some bleeding, when she came in for a nurse visit she did not want 3 layer compression put back on about our nurse managed to convince her. She has known chronic venous visit issues and I'm hoping to get her to tolerate the 3 layer compression. using Hydrofera Blue 10/30/17; absolutely no change in the condition of either wound although we've had some improvement in dimensions on the right.. Attempted to put her in 3 layer compression she didn't tolerated she is back in 2 layer compression. We've been using Hydrofera Blue We looked over her past records. She had venous reflux studies in November 2016. There was no evidence of deep venous reflux on the right. Superficial vein did not show the greater saphenous vein at think this is been previously ablated the small saphenous vein was within normal limits. The left deep venous system showed no DVT the vessels were positive for deep venous reflux in the posterior tibial veins at the ankle. The greater  saphenous vein was surgically absent small saphenous vein was within normal limits. She went to vein and vascular at [facility]. I believe she had an ablation on the left greater saphenous  vein. I'll update her reflux studies perhaps ever reviewed by vein and vascular. We've made absolutely no progress in these wounds. Will also try to read and TheraSkins through her insurance 11/06/17; W the patient apparently has a 2 week follow-up with vein and vascular I like him to review the whole issue with regards to her previous vascular workup by Dr. Aleda Grana. We've really made no progress on these wounds in many months. She arrives today with less viable looking surface on the left medial ankle wound. This was apparently looking about the same on Tuesday when she was here for nurse visit. 11/13/17; deep tissue culture I did last time of the left lower leg showed multiple organisms without any predominating. In particular no Staphylococcus or group A strep were isolated. We sent her for venous reflux studies. She's had a previous left greater saphenous vein stripping and I think sclerotherapy of the right greater saphenous vein. She didn't really look at the lesser saphenous vein this both wounds are on the medial aspect. She has reflux in the common femoral vein and popliteal vein and an accessory vein on the right and the common femoral vein and popliteal vein on the left. I'm going to have her go to see vein and vascular just the look over things and see if anything else beside aggressive compression is indicated here. We have not been able to make any progress on these wounds in spite of the fact that the surface of the wounds is never look too bad. 11/20/17; no major change in the condition of the wounds. Patient reports a large amount of drainage. She has a lot of complaints of pain although enlisting her today I wonder if some of this at least his neuropathic rather than secondary to her wounds.  She has an appointment with vein and vascular on 12/30/17. The refractory nature of these wounds in my mind at least need vein and vascular to look over the wounds the recent reflux studies we did and her history to see if anything further can be done here. I also note her gait is deteriorated quite a bit. 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 She has a bilateral Trendelenburg gait. I wonder if this is neuropathic or perhaps multilevel radicular. 11/27/17; her wounds actually looks slightly better. Healthy-looking granulation tissue a scant amount of epithelialization. [place] healthcare will not pay for [employer]. They will play for tri layer Oasis and Dermagraft. This is not a diabetic ulcer. We'll try for the tri layer Oasis. She still complains of some drainage. She has a vein and vascular appointment on 12/30/17 12/04/17; the wounds visually look quite good. Healthy-looking granulation with some degree of epithelialization. We are still waiting for response to our request for trial to try layer Oasis. Her appointment with vascular to review venous and arterial issues isn't sold the end of July 7/31. Not allow debridement or even vigorous cleansing of the wound surface. 12/18/17; slightly smaller especially on the right. 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 We've been using Hydrofera Blue. We still are looking into triple layer Oasis through her insurance 01/08/18 on evaluation today patient's wound actually appears to be showing signs of good improvement at this point in time. She has been tolerating the dressing changes without complication. Fortunately there does not appear to be any evidence of infection at this point in time. We have been utilizing silver nitrate which does seem to be of benefit for her which is  also good news. Overall I'm very happy with how things seem to be both regards appearance as well as measurement. Patient did see Dr. Bridgett Larsson  for evaluation on 12/30/17. In his assessment he felt that stripping would not likely add much more than chronic compression to the patient's healing process. His recommendation was to follow-up in three months with Dr. Doren Custard if she hasn't healed in order to consider referral back to you and see vascular where she previously was in a trial and was able to get her wound to heal. I'll be see what she feels she when you staying compression and he reiterated this as well. 01/13/18 on evaluation today patient appears to actually be doing very well in regard to her bilateral medial malleolus ulcers. She seems to have tolerated the chemical cauterization with silver nitrate last week she did have some pain through that evening but fortunately states that I'll be see since it seems to be doing better she is overall pleased with the progress. 01/21/18; really quite a remarkable improvement since I've last seen these wounds. We started using silver nitrate specially on the islands of hyper granulation which for some reason her around the wound circumference. This is really done quite nicely. Primary dressing Hydrofera Blue under 4 layer compression. She seems to be able to hold out without a nurse rewrap. Follow-up in 1 week 01/28/18; we've continued the hydrofera blue but continued with chemical cauterization to the wound area that we started about a month ago for irregular hyper granulation. She is made almost stunning improvement in the overall wound dimensions. I was not really expecting this degree of improvement in these chronic wounds 02/05/18; we continue with Hydrofera Bluebut of also continued the aggressive chemical cauterization with silver nitrate. We made nice progress with the right greater than left wound. 02/12/18. We continued with Hydrofera Blue after aggressive chemical cauterization with silver nitrate. We appear to be making nice progress with both wound areas 02/19/2018; we continue with  [facility] after washing the wounds vigorously with Anasept spray and chemical cauterization with silver nitrate. We are making excellent progress. The area on the right's just about closed 02/26/2018. The area on the left medial ankle had too much necrotic debris today. I used a #5 curette we are able to get most of the soft. I continued with the silver nitrate to the much smaller wound on the right medial ankle she had a new area on her right lower pretibial area which she says was due to a role in her compression 03/05/2018; both wound areas look healthy. Not much change in dimensions from last week. I continue to use silver nitrate and Hydrofera Blue. The patient saw Dr. Doren Custard of vein and vascular. He felt she had venous stasis ulcers. He felt based on her previous arteriogram she should have adequate circulation for healing. Also she has deep venous reflux but really no significant correctable superficial venous reflux at this time. He felt we should continue with conservative management including leg elevation and compression 04/02/2018; since we last saw this woman about a month ago she had a fall apparently suffered a pelvic fracture. I did not look up the x-ray. Nevertheless because of pain she literally was bedbound for 2 weeks and had home health coming out to change the dressing. Somewhat predictably this is resulted in considerable improvement in both wound areas. The right is just about closed on the medial malleolus and the left is about half the size. 04/16/2018; both her  wounds continue to go down in size. Using Hydrofera Blue. 05/07/18; both her wounds appeared to be improving especially on the right where it is almost closed. We are using Hydrofera Blue 05/14/2018; slightly worse this week with larger wounds. Surface on the left medial not quite as good. We have been using Hydrofera Blue 05/21/18; again the wounds are slightly larger. Left medial malleolus slightly larger with  eschar around the circumference. We have been using Hydrofera Blue undergoing a wraps for a prolonged period of time. This got a lot better when she was more recumbent due to a fall and a back injury. I change the primary dressing the silver alginate today. She did not tolerate a 4 layer compression previously although I may need to bring this up with her next time 05/28/2018; area on the left medial malleolus again is slightly larger with more drainage. Area on the right is roughly unchanged. She has a small area of folliculitis on the right medial just on the lower calf. This does not look ominous. 06/03/2018 left medial malleolus slightly smaller in a better looking surface. We used silver nitrate on this last time with silver alginate. The area on the right appears slightly smaller 1/10; left medial malleolus slightly smaller. Small open area on the right. We used silver nitrate and silver alginate as of 2 weeks ago. We continue with the wound and compression. These got a lot better when she was off her feet 1/17; right medial malleolus wound is smaller. The left may be slightly smaller. Both surfaces look somewhat better. 1/24; both wounds are slightly smaller. Using silver alginate under Unna boots 1/31; both wounds appear smaller in fact the area on the right medial is just about closed. Surface eschar. We have been using silver alginate under Unna boots. The patient is less active now spends let much less time on her feet and I think this is contributed to the general improvement in the wound condition 2/7; both wounds appear smaller. I was hopeful the right medial would be closed however there there is still the same small open area. Slight amount of surface eschar on the left the dimensions are smaller there is eschar but the wound edges appear to be free. We have been using silver alginate under Unna boot's 2/14; both wounds once again measure smaller. Circumferential eschar on the left  medial. We have been using silver alginate under Unna boots with gradual improvement 2/21; the area on the right medial malleolus has healed. The area on the left is smaller. We have been using silver alginate and Unna boots. We can discharge wrapping the right leg she has 20/30 stockings at home she will need to protect the scar tissue in this area 2/28; the area on the right medial malleolus remains closed the patient has a compression stocking. The area on the left is smaller. We have been using silver alginate and Unna boots. 3/6 the area on the right medial ankle remains closed. Good edema control noted she is using her own compression stocking. The area on the left medial ankle is smaller. We have been managing this with silver alginate and Unna boots which we will continue today. 3/13; the area on the right medial ankle remains closed and I'm declaring it healed today. When necessary the left is about the same still a healthy-looking surface but no major change and wound area. No evidence of infection and using silver alginate under unna and generally making considerable improvement 3/27 the area on  the right medial ankle remains closed the area on the left is about the same as last week. Certainly not any worse we have been using silver alginate under an Unna boot 4/3; the area on the right medial ankle remains closed per the patient. We did not look at this wound. The wound on the left medial ankle is about the same surface looks healthy we have been using silver alginate under an Unna boot 4/10; area on the right medial ankle remains closed per the patient. We did not look at this wound. The wound on the left medial ankle is slightly larger. The patient complains that the [facility] caused burning pain all week. She also told us that she was a lot more active this week. Changed her back to silver alginate 4/17; right medial ankle still closed per the patient. Left medial ankle is  slightly larger. Using silver alginate. She did not tolerate Hydrofera Blue on this area 4/24; right medial ankle remains closed we have not look at this. The left medial ankle continues to get larger today by about a centimeter. We have been using silver alginate under Unna boots. She complains about 4 layer compression as an alternative. She has been up on her feet working on her garden 5/8; right medial ankle remains closed we did not look at this. The left medial ankle has increased in size about 100%. We have been using silver alginate under Unna boots. She noted increased pain this week and was not surprised that the wound is deteriorated 5/15; no major change in SA however much less erythema ( one week of doxy ocellulitis). 5/22-65 year old female returns at 1 week to the clinic for left medial ankle wound for which we have been using silver alginate under 3 layer compression She was placed on DOXY at last visit - the wound is wider at this visit. She is in 3 layer compression 5/29; change to [facility] last week. I had given her empiric doxycycline 2 weeks ago for a week. She is in 3 layer compression. She complains of a lot of pain and drainage on presentation today. 6/5; using Hydrofera Blue. I gave her doxycycline recently empirically for erythema and pain around the wound. Believe her cultures showed enterococcus which not would not have been well covered by doxycycline nevertheless the wound looks better and I don't feel specifically that the enterococcus needs to be covered. She has a new what looks like a wrap injury on her lateral left ankle. 6/12; she is using Hydrofera Blue. She has a new area on the left anterior lower tibial area. This was a wrap injury last week. 6/19; the patient is using Hydrofera Blue. She arrived with marked inflammation and erythema around the wound and tenderness. 12/01/18 on evaluation today patient appears to be doing a little bit better based on what  I'm hearing from the standpoint of lassos evaluation to this as far as the overall appearance of the wound is concerned. Then sometime substandard she typically sees Dr. Dellia Nims. Nonetheless overall very pleased with the progress that she's made up to this point. No fevers, chills, nausea, or vomiting noted at this time. 7/10; some improvement in the surface area. Aggressively debrided last week apparently. I went ahead with the debridement today although the patient does not tolerate this very well. We have been using Iodoflex. Still a fair amount of drainage 7/17; slightly smaller. Using Iodoflex. 7/24; no change from last week in terms of surface area. We have  been using Iodoflex. Surface looks and continues to look somewhat better 7/31; surface area slightly smaller better looking surface. We have been using Iodoflex. This is under Unna boot compression 8/7-Patient presents at 1 week with Unna boot and Iodoflex, wound appears better 8/14-Patient presents at 1 week with Iodoflex, we use the Unna boot, wound appears to be stable better.Patient is getting Botox treatment for the inversion of the foot for tendon release, Next week 8/21; we are using Iodoflex. Unna boot. The wound is stable in terms of surface area. Under illumination there is some areas of the wound that appear to be either epithelialized or perhaps this is adherent slough at this point I was not really clear. It did not wipe off and I was reluctant to debride this today. 8/28; we are using Iodoflex in an Unna boot. Seems to be making good improvement. 9/4; using Iodoflex and wound is slightly smaller. 9/18; we are using Iodoflex with topical silver nitrate when she is here. The wound continues to be smaller 10/2; patient missed her appointment last week due to GI issues. She left and Iodoflex based dressing on for 2 weeks. Wound is about the same size about the size of a dime on the left medial lower 10/9 we have been using  Iodoflex on the medial left ankle wound. She has a new superficial probable wrap injury on the dorsal left ankle 10/16; we have been using Hydrofera Blue since last week. This is on the left medial ankle 10/23; we have been using Hydrofera Blue since 2 weeks ago. This is on the left medial ankle. Dimensions are better 11/6; using Hydrofera Blue. I think the wound is smaller but still not closed. Left medial ankle 11/13; we have been using Hydrofera Blue. Wound is certainly no smaller this week. Also the surface not as good. This is the remanent of a very large area on her left medial ankle. 11/20; using Sorbact since last week. Wound was about the same in terms of size although I was disappointed about the surface debris 12/11; 3-week follow-up. Patient was on vacation. Wound is measuring slightly larger we have been using Sorbact. 12/18; wound is about the same size however surface looks better last week after debridement. We have been using Sorbact under compression 1/15 wound is probably twice the size of last time increased in length nonviable surface. We have been using Sorbact. She was running a mild fever and missed her appointment last week 1/22; the wound is come down in size but under illumination still a very adherent debris we have been Hydrofera Blue that I changed her to last week 1/29; dimensions down slightly. We have been using Hydrofera Blue 2/19 dimensions are the same however there is rims of epithelialization under illumination. Therefore more the surface area may be epithelialized 2/26; the patient's wound actually measures smaller. The wound looks healthy. We have been using Hydrofera Blue. I had some thoughts about running Apligraf then I still may do that however this looks so much better this week we will delay that for now 3/5; the wound is small but about the same as last week. We have been using Hydrofera Blue. No debridement is required today. 3/19; the wound is about  the size of a dime. Healthy looking wound even under illumination. We have been using Hydrofera Blue. No mechanical debridement is necessary 3/26; not much change from last week although still looks very healthy. We have been using Hydrofera Blue under Unna boots Patient was  offered an ankle fusion by podiatry but not until the wound heals with a proceed with this. 4/9; the patient comes in today with her original wound on the medial ankle looking satisfactory however she has some uncontrolled swelling in the middle part of her leg with 2 new open areas superiorly just lateral to the tibia. I think this was probably a wrap issue. She said she felt uncomfortable during the week but did not call in. We have been using Hydrofera Blue 4/16; the wound on the medial ankle is about the same. She has innumerable small areas superior to this across her mid tibia. I think this is probably folliculitis. She is also been working in the yard doing a lot of sweating 4/30; the patient issue on the upper areas across her mid tibia of all healed. I think this was excessive yard work if I remember. Her wound on the medial ankle is smaller. Some debris on this we have been using Hydrofera Blue under Unna boots 5/7; mid tibia. She has been using Hydrofera Blue under an Unna wrap. She is apparently going for her ankle surgery on June 3 10/28/19-Patient returns to clinic with the ankle wound, we are using Hydrofera Blue under Unna wrap, surgery is scheduled for her left foot for June 3 so she will be back for nurse visit next week READMISSION 01/17/2020 Mrs. Faga is a 65 year old woman we have had in this clinic for a long period of time with severe venous hypertension and refractory wounds on her medial lower legs and ankles bilaterally. 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 When she was off  her legs for a prolonged period example when she fell and suffered a compression fracture things would heal up quite nicely. She is now retired and we managed to heal up the right medial leg wound. The left one was very tiny last time I saw this although still refractory. 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 It got to the point where this was interfering with her walking and she elected to proceed with a ankle arthrodesis to straighten her her ankle and leave her with a functional outcome for mobilization. The patient was referred to Dr. Doren Custard and really this took some time to arrange. Dr. Doren Custard saw her on 12/07/2019. Once again he verified that she had no arterial issues. She had previously had an angiogram several years ago. Follow-up ABIs on the left showed an ABI of 1.12 with triphasic waveforms and a TBI of 0.92. She is felt to have chronic deep venous insufficiency but I do not think it was felt that anything could be done from about this from an ablation point of view. At the time Dr. Doren Custard saw this patient the wounds actually look closed via the pictures in his clinic. The patient finally underwent her surgery on 12/15/2019. This went reasonably well and there was a good anatomic outcome. She developed a small distal wound dehiscence on the lateral part of the surgical wound. However more problematically she is developed recurrence of the wound on the medial left ankle. There are actually 2 wounds here one in the distal lower leg and 1 pretty much at the level of the medial malleolus. It is a more distal area that is more problematic. She  has been using Hydrofera Blue which started on Friday before this she was simply Ace wrapping. There was a culture done that showed Pseudomonas and she is on ciprofloxacin. A recent CNS on 8/11 was negative. The patient reports some pain but I generally think this is  improving. She is using a cam boot completely nonweightbearing using a walker for pivot transfers and a wheelchair 8/24; not much improvement unfortunately she has a surgical wound on the lateral part in the venous insufficiency wound medially. The bottom part of the medial insufficiency wound is still necrotic there is exposed tendon here. We have been using Hydrofera Blue under compression. Her edema control is however better 8/31; patient in for follow-up of his surgical wound on the lateral part of her left leg and chronic venous insufficiency ulcers medially. We put her back in compression last week. She comes in today with a complaint of 3 or 4 days worth of increasing pain. She felt her cam walker was rubbing on the area on the back of her heel. However there is intense erythema seems more likely she has cellulitis. She had 2 cultures done when she was seeing podiatry in the postop. One of them in late July showed Pseudomonas and she received a course of ciprofloxacin the other was negative on 8/11 she is allergic to penicillin with anaphylactoid complaints of hives oral swelling via information in epic 9/9; when I saw this patient last week she had intense anterior erythema around her wound on the right lateral heel and ankle and also into the right medial heel. Some of this was no doubt drainage and her walker boot however I was convinced she had cellulitis. I gave her Levaquin and Bactrim she is finishing up on this now. She is following up with Dr. [name] he saw her yesterday. He is taken her out of the walking boot of course she is still nonweightbearing. Her x-ray was negative for any worrisome features such as soft tissue air etc. Things are a lot better this week. She has home health. We have been using Hydrofera Blue under an [employer] which she put back on yesterday. I did not wrap her last week 9/17; her surrounding skin looks a lot better. In fact the area on the left lateral ankle  has just a scant amount of eschar. The only remaining wound is the large area on the left medial ankle. Probably about 60% of this is healthy granulation at the surface however she has a significant divot distally. This has adherent debris in it. I been using debridement and silver collagen to try and get this area to fill-in although I do not think we have made much progress this week 9/24; the patient's wound on the left medial ankle looks a lot better. The deeper divot area distally still requires debridement but this is cleaning up quite nicely we have been using silver collagen. The patient is complaining of swelling in her foot and is worried that that is contributing to the nonhealing of the ankle wound. She is also complaining of numbness in her anterior toes 10/4; left medial ankle. The small area distally still has a divot with necrotic material that I have been debriding away. This has an undermining area. She is approved for Apligraf. She saw Dr. [name] her surgeon on 10/1. I think he declared himself is satisfied with the condition of things. Still nonweightbearing till the end of the month. We are dealing with the venous insufficiency wounds on the  medial ankle. Her surgical wound is well closed. There is no evidence of infection 10/11; the patient arrived in clinic today with the expectation that we be able to put an Apligraf on this area after debridement however she arrives with a relatively large amount of green drainage on the dressing. The patient states that this started on Friday. She has not been systemically unwell. 10/19; culture I did last week showed both Enterococcus and Pseudomonas. I think this came in separate parts because I stopped her ciprofloxacin I gave her and prescribed her linezolid however now looking at the final culture result this is Pseudomonas which is resistant to quinolones. She has not yet picked up the linezolid apparently phone issues. We are also  trying to get a topical antibiotic out of [place] in [place] they can be applied by home health. She is still having green drainage 10/16; the patient has her topical antibiotic from [facility] in [place]. This is a compounded gel with vancomycin and ciprofloxacin and gentamicin. We are applying this on the wound bed with silver alginate over the top with Unna boot wraps. She arrives in clinic today with a lot less ominous looking drainage although she is only use this topical preparation once the second time today. She sees Dr. [name] her surgeon on Friday she has home health changing the dressing 11/2; still using her compounded topical antibiotic under silver alginate. Surface is cleaning up there is less drainage. We had an Apligraf for her today and I elected to apply it. A light coating of her antibiotic 04/25/2020 upon evaluation today patient appears to be doing well with regard to her ankle ulcer. There is a little bit of slough noted on the surface of the wound I am can have to perform sharp debridement to clear this away today. With that being said other than that fact overall I feel like she is making progress and we do see some new epithelial growth. There is also some improvement in the depth of the wound and that distal portion. There is little bit of slough there as well. 12/7; 2-week follow-up. Apligraf #3. Dimensions are smaller. Closing in especially inferiorly. Still some surface debris. Still using the [facility] topical antibiotic but I told her that I don't think this needs to be renewed 12/21; 2-week follow-up. Apligraf #4 dimensions are smaller. Nice improvement 06/05/2020; 2-week follow-up. The patient's wound on the left medial ankle looks really excellent. Nice granulation. Advancing epithelialization no undermining no evidence of infection. We would have to reapply for another Apligraf but with the condition of this wound I did not feel strongly about it. We  used Hydrofera Blue under the same degree of compression. She follows up with Dr. [name] her surgeon a week Friday 06/13/2020 upon evaluation today patient appears to be doing excellent in regard to her wound. She has been tolerating the dressing changes without complication. Fortunately there is no signs of active infection at this time. No fevers, chills, nausea, vomiting, or diarrhea. She was using Hydrofera Blue last week. 06/20/2020 06/20/2020 on evaluation today patient actually appears to be doing excellent in regard to her wound. This is measuring better and looking much better as well. She has been using the collagen that seems to be doing better for her as well even though the [facility] was and is not sticking or feeling as rough on her wound. She did see Dr. [name] on Friday he is very pleased he also stated none of the hardware  has shifted. That is great news 1/27; the patient has a small clean wound all that is remaining. I agree that this is too small to really consider an Apligraf. Under illumination the surface is looking quite good. We have been using collagen although the dimensions are not any better this week 2/2; the patient has a small clean wound on the left medial ankle. Although this left of her substantial original areas. Measurements are smaller. We have been using polymen Ag under an Haematologist. 2/10; small area on the left medial ankle. This looks clean nothing to debride however dimensions are about the same we have been using polymen I think now for 2 weeks 2/17; not much change in surface area. We have been using polymen Ag without any improvement. 3/17; 1 month follow-up. The patient has been using endoform without any improvement in fact I think this looks worse with more depth and more expansion 3/24; no improvement. Perhaps less debris on the surface. We have been using Sorbact for 1 week 4/4; wound measures larger. She has edema in her leg and her foot which she  tells as her wrap came down. We have been using Unna boots. Sorbact of the wound. She has been approved for Apligraf 09/12/2020 upon evaluation today patient appears to be doing well with regard to her wound. We did get the Apligraf reapproved which is great news we have that available for application today. Fortunately there is no signs of infection and overall the patient seems to be doing great. The wound bed is nice and clean. 4/27; patient presents for her second application of Apligraf. She states over the past week she has been on her feet more often due to being outside in her garden. She has noted more swelling to her foot as a result. She denies increased warmth, pain or erythema to the wound site. 10/10/2020 upon evaluation today patient appears to be doing well with regard to her wound which does not appear to be quite as irritated as last week from what I am hearing. With that being said unfortunately she is having issues with some erythema and warmth to touch as well as an increase size. I do believe this likely is infected. 10/17/2020 upon evaluation today patient appears to be doing excellent in regard to her wound this is significantly improved as compared to last week. Fortunately I think that the infection is much better controlled at this point. She did have evidence of both Enterococcus as well as Staphylococcus noted on culture. Enterococcus really would not be helped significantly by the Cipro but the wound is doing so much better I am under the assumption that the Staphylococcus is probably the main organism that is causing the current infection. Nonetheless I think that she is doing excellent as far as that is concerned and I am very pleased in that regard. I would therefore recommend she continue with the Cipro. 10/31/2020 upon evaluation today patient appears to be doing well with regard to her wound. She has been tolerating the dressing changes without complication. Fortunately  there is no signs of active infection and overall I am extremely pleased with where things stand today. No fevers, chills, nausea, vomiting, or diarrhea. With that being said she does have some green drainage coming from the wound and although it looks okay I am a little concerned about the possibility of a continuing infection. Specifically with Pseudomonas. For that reason I will go ahead and send in a prescription for Cipro  for her to be continued. 11/14/2020 upon evaluation today patient appears to be doing very well currently in regard to her wound on her leg. She has been tolerating the dressing changes without complication. Fortunately I feel like the infection is finally under good control here. Unfortunately we do not have the Apligraf for application today although we can definitely order to have it in place for next week. That will be her fifth and final of the current series. Nonetheless I feel like her wound is really doing quite well which is great news. Electronic Signature(s) Signed: 11/14/2020 12:57:11 PM By: [name] PA-C Entered By: [name] on 11/14/2020 12:57:11 -------------------------------------------------------------------------------- Physical Exam Details Patient Name: Date of Service: [facility] [name], [name] 11/14/2020 10:00 A M Medical Record Number: [id number] Patient Account Number: [id number] Date of Birth/Sex: Treating RN: 1955-07-31 (65 y.o. [name] Provider: [name] Other Clinician: Referring Provider: Treating Provider/Extender: [name] in Treatment: 74 Constitutional Well-nourished and well-hydrated in no acute distress. Respiratory normal breathing without difficulty. Psychiatric this patient is able to make decisions and demonstrates good insight into disease process. Alert and Oriented x 3. pleasant and cooperative. Notes Upon inspection patient's wound bed actually showed signs of  good granulation epithelization at this point. There does not appear to be any signs of infection which is great news and overall I am extremely pleased with where we stand at this point. I did perform some sharp debridement to clear away some of the necrotic debris she tolerated that today without complication postdebridement wound bed appears to be doing much better. Electronic Signature(s) Signed: 11/14/2020 12:57:32 PM By: [name] PA-C Entered By: [name] on 11/14/2020 12:57:32 -------------------------------------------------------------------------------- Physician Orders Details Patient Name: Date of Service: [name], [name] 11/14/2020 10:00 [place] Record Number: [id number] Patient Account Number: [id number] Date of Birth/Sex: Treating RN: 08/06/1955 (65 y.o. [name] Provider: [name] Other Clinician: Referring Provider: Treating Provider/Extender: [name] in Treatment: 44 Verbal / Phone Orders: No Diagnosis Coding ICD-10 Coding Code Description [phone] Chronic venous hypertension (idiopathic) with ulcer and inflammation of left lower extremity L97.828 Non-pressure chronic ulcer of other part of left lower leg with other specified severity L97.328 Non-pressure chronic ulcer of left ankle with other specified severity Follow-up Appointments ppointment in 1 week. - Apligraf next week Return A Cellular or Tissue Based Products Cellular or Tissue Based Product Type: Bathing/ Shower/ Hygiene May shower with protection but do not get wound dressing(s) wet. Edema Control - Lymphedema / SCD / Other Elevate legs to the level of the heart or above for 30 minutes daily and/or when sitting, a frequency of: - throughout the day Avoid standing for long periods of time. Exercise regularly Compression stocking or Garment 20-30 mm/Hg pressure to: - right leg daily Wound Treatment Wound #15 - Malleolus  Wound Laterality: Left, Medial Cleanser: Soap and Water 1 x Per Week/30 Days Discharge Instructions: May shower and wash wound with dial antibacterial soap and water prior to dressing change. Peri-Wound Care: Triamcinolone 15 (g) 1 x Per Week/30 Days Discharge Instructions: Use triamcinolone 15 (g) mixed with lotion Peri-Wound Care: Sween Lotion (Moisturizing lotion) 1 x Per Week/30 Days Discharge Instructions: Apply moisturizing lotion as directed Prim Dressing: Promogran Prisma Matrix, 4.34 (sq in) (silver collagen) 1 x Per Week/30 Days ary Discharge Instructions: Moisten collagen with saline or hydrogel Secondary Dressing: Woven Gauze  Sponge, Non-Sterile 4x4 in 1 x Per Week/30 Days Discharge Instructions: Apply over primary dressing as directed. Secondary Dressing: ABD Pad, 5x9 1 x Per Week/30 Days Discharge Instructions: Apply over primary dressing as directed. Compression Wrap: Unnaboot w/Calamine, 4x10 (in/yd) 1 x Per Week/30 Days Discharge Instructions: Apply Unnaboot as directed. Electronic Signature(s) Signed: 11/14/2020 6:34:46 PM By: Baruch Gouty RN, BSN Signed: 11/14/2020 6:55:53 PM By: [name] PA-C Entered By: Baruch Gouty on 11/14/2020 11:15:27 -------------------------------------------------------------------------------- Problem List Details Patient Name: Date of Service: [name], [name] 11/14/2020 10:00 A M Medical Record Number: [id number] Patient Account Number: [id number] Date of Birth/Sex: Treating RN: Oct 08, 1955 (64 y.o. [name] Provider: [name] Other Clinician: Referring Provider: Treating Provider/Extender: [name] in Treatment: 79 Active Problems ICD-10 Encounter Code Description Active Date MDM Diagnosis I87.332 Chronic venous hypertension (idiopathic) with ulcer and inflammation of left 01/17/2020 No Yes lower extremity L97.828 Non-pressure chronic ulcer of other part of left  lower leg with other specified 01/17/2020 No Yes severity L97.328 Non-pressure chronic ulcer of left ankle with other specified severity 01/17/2020 No Yes Inactive Problems ICD-10 Code Description Active Date Inactive Date L03.116 Cellulitis of left lower limb 01/31/2020 01/31/2020 T81.31XD Disruption of external operation (surgical) wound, not elsewhere classified, subsequent 01/17/2020 01/17/2020 encounter Resolved Problems Electronic Signature(s) Signed: 11/14/2020 10:47:05 AM By: [name] PA-C Entered By: [name] on 11/14/2020 10:47:05 -------------------------------------------------------------------------------- Progress Note Details Patient Name: Date of Service: [facility] [name], [name] 11/14/2020 10:00 A M Medical Record Number: [id number] Patient Account Number: [id number] Date of Birth/Sex: Treating RN: 1955/06/07 (65 y.o. [name] Provider: [name] Other Clinician: Referring Provider: Treating Provider/Extender: [name] in Treatment: 35 Subjective Chief Complaint Information obtained from Patient patient is been followed long-term in this clinic for venous insufficiency ulcers with inflammation, hypertension and ulceration over the medial ankle bilaterally. 01/17/2020; this is a patient who is here for review of postoperative wounds on the left lateral ankle and recurrence of venous stasis ulceration on the left medial History of Present Illness (HPI) the remaining wound is over the left medial ankle. Similar wound over the right medial ankle healed largely with use of Apligraf. Most recently we have been using Hydrofera Blue over this wound with considerable improvement. The patient has been extensively worked up in the past for her venous insufficiency and she is not a candidate for antireflux surgery although I have none of the details available currently. 08/24/14; considerable improvement today. About 50%  of this wound areas now epithelialized. The base of the wound appears to be healthier granulation.as opposed to last week when she had deteriorated a considerable improvement 08/17/14; unfortunately the wound has regressed somewhat. The areas of epithelialization from the superior aspect are not nearly as healthy as they were last week. The patient thinks her Hydrofera Blue slipped. 09/07/14; unfortunately the area has markedly regressed in the 2 weeks since I've seen this. There is an odor surrounding erythema. The healthy granulation tissue that we had at the base of the wound now is a dusky color. The nurse reports green drainage 09/14/14; the area looks somewhat better than last week. There is less erythema and less drainage. The culture I did did not show any growth. Nevertheless I think it is better to continue the Cipro and doxycycline for a further week. The remaining wound area was debridement. 09/21/14. Wound did not require debridement last  week. Still less erythema and less drainage. She can complete her antibiotics. The areas of epithelialization in the superior aspect of the wound do not look as healthy as they did some weeks ago 10/05/14 continued improvement in the condition of this wound. There is advancing epithelialization. Less aggressive debridement required 10/19/14 continued improvement in the condition and volume of this wound. Less aggressive debridement to the inferior part of this to remove surface slough and fibrinous eschar 11/02/14 no debridement is required. The surface granulation appears healthy although some of her islands of epithelialization seem to have regressed. No evidence of infection 11/16/14; lites surface debridement done of surface eschar. The wound does not look to be unhealthy. No evidence of infection. Unfortunately the patient has had podiatry issues in the right foot and for some reason has redeveloped small surface ulcerations in the medial right ankle.  Her original presentation involved wounds in this area 11/23/14 no debridement. The area on the right ankle has enlarged. The left ankle wound appears stable in terms of the surface although there is periwound inflammation. There has been regression in the amount of new skin 11/30/14 no debridement. Both wound areas appear healthy. There was no evidence of infection. The the new area on the right medial ankle has enlarged although that both the surfaces appear to be stable. 12/07/14; Debridement of the right medial ankle wound. No no debridement was done on the left. 12/14/14 no major change in and now bilateral medial ankle wounds. Both of these are very painful but the no overt evidence of infection. She has had previous venous ablation 12/21/14; patient states that her right medial ankle wound is considerably more painful last week than usual. Her left is also somewhat painful. She could not tolerate debridement. The right medial ankle wound has fibrinous surface eschar 12/28/14 this is a patient with severe bilateral venous insufficiency ulcers. For a considerable period of time we actually had the one on the right medial ankle healed however this recently opened up again in June. The left medial ankle wound has been a refractory area with some absent flows. We had some success with Hydrofera Blue on this area and it literally closed by 50% however it is recently opened up Foley. Both of these were debridement today of surface eschar. She tolerates this poorly 01/25/15: No change in the status of this. Thick adherent escar. Very poor tolerance of any attempt at debridement. I had healed the right medial malleolus wound for a considerable amount of time and had the left one down to about 50% of the volume although this is totally regressed over the last 48 weeks. Further the right leg has reopened. she is trying to make a appointment with pain and vascular, previous ablations with Dr. Aleda Grana. I do  not believe there is an arterial insufficiency issue here 02/01/15 the status of the adherent eschar bilaterally is actually improved. No debridement was done. She did not manage to get vascular studies done 02/08/15 continued debridement of the area was done today. The slough is less adherent and comes off with less pressure. There is no surrounding infection peripheral pulses are intact 02/15/15 selective debridement with a disposable curette. Again the slough is less adherent and comes off with less difficulty. No surrounding infection peripheral pulses are intact. 02/22/15 selective debridement of the right medial ankle wound. Slough comes off with less difficulty. No obvious surrounding infection peripheral pulses are intact I did not debridement the one on the left. Both of these  are stable to improved 03/01/15 selective debridement of both wound areas using a curette to. Adherent slough cup soft with less difficulty. No obvious surrounding infection. The patient tells me that 2 days ago she noted a rash above the right leg wrap. She did not have this on her lower legs when she change this over she arrives with widespread left greater than right almost folliculitis-looking rash which is extremely pruritic. I don't see anything to culture here. There is no rash on the rest of her body. She feels well systemically. 03/08/15; selective debridement of both wounds using a curette. Base of this does not look unhealthy. She had limegreen drainage coming out of the left leg wound and describes a lot of drainage. The rash on her left leg looks improved to. No cultures were done. 03/22/15; patient was not here last week. Basal wounds does not look healthy and there is no surrounding erythema. No drainage. There is still a rash on the left leg that almost looks vasculitic however it is clearly limited to the top of where the wrap would be. 04/05/15; on the right required a surgical debridement of surface eschar  and necrotic subcutaneous tissue. I did not debridement the area on the left. These continue to be large open wounds that are not changing that much. We were successful at one point in healing the area on the right, and at the same time the area on the left was roughly half the size of current measurements. I think a lot of the deterioration has to do with the prolonged time the patient is on her feet at work 04/19/15 I attempted-like surface debridement bilaterally she does not tolerate this. She tells me that she was in allergic care yesterday with extreme pain over her left lateral malleolus/ankle and was told that she has an "sprain" 05/03/15; large bilateral venous insufficiency wounds over the medial malleolus/medial aspect of her ankles. She complains of copious amounts of drainage and his usual large amounts of pain. There is some increasing erythema around the wound on the right extending into the medial aspect of her foot to. historically she came in with these wounds the right one healed and the left one came down to roughly half its current size however the right one is reopened and the left is expanded. 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 05/10/15 large bilateral venous insufficiency wounds. There is less adherence surface left however the surface culture that I did last week grew pseudomonas therefore bilateral selective debridement score necessary. There is surrounding erythema. The patient describes severe bilateral drainage and a lot of pain in the left ankle. Apparently her podiatrist was were ready to do a cortisone shot 05/17/15; the patient complains of pain and again copious amounts of drainage. 05/24/15; we used Iodo flex last week. Patient notes considerable improvement in wound drainage. Only needed to change this once. 05/31/15; we continued Iodoflex; the base of these large wounds bilaterally is not too bad but there is  probably likely a significant bioburden here. I would like to debridement just doesn't tolerate it. 06/06/14 I would like to continue the Iodoflex although she still hasn't managed to obtain supplies. She has bilateral medial malleoli or large wounds which are mostly superficial. Both of them are covered circumferentially with some nonviable fibrinous slough although she tolerates debridement very poorly. She apparently has an appointment for an ablation on the right leg  by interventional radiology. 06/14/15; the patient arrives with the wounds and static condition. We attempted a debridement although she does not do well with this secondary to pain. I 07/05/15; wounds are not much smaller however there appears to be a cleaner granulating base. The left has tight fibrinous slough greater than the right. Debridement is tolerated poorly due to pain. Iodoflex is done more for these wounds in any of the multitude of different dressings I have tried on the left 1 and then subsequently the right. 07/12/15; no change in the condition of this wound. I am able to do an aggressive debridement on the right but not the left. She simply cannot tolerate it. We have been using Iodoflex which helps somewhat. It is worthwhile remembering that at one point we healed the right medial ankle wound and the left was about 25% of the current circumference. We have suggested returning to vascular surgery for review of possible further ablations for one reason or another she has not been able to do this. 07/26/15 no major change in the condition of either wound on her medial ankle. I did not attempt to debridement of these. She has been aggressively scrubbing these while she is in the shower at home. She has her supply of Iodoflex which seems to have done more for these wounds then anything I have put on recently. 08/09/15 wound area appears larger although not verified by measurements. Using Iodoflex 09/05/2015 -- she was here for  avisit today but had significant problems with the wound and I was asked to see her for a physician opinion. I have summarize that this lady has had surgery on her left lower extremity about 10 years ago where the possible veins stripping was done. She has had an opinion from interventional radiology around November 2016 where no further sclerotherapy was ordered. The patient works 12 hours a day and stands on a concrete floor with work boots and is unable to get the proper compression she requires and cannot elevate her limbs appropriately at any given time. She has recently grown Pseudomonas from her wound culture but has not started her ciprofloxacin which was called in for her. 09/13/15 this continues to be a difficult situation for this patient. At one point I had this wound down to a 1.5 x 1.5" wound on her left leg. This is deteriorated and the right leg has reopened. She now has substantial wounds on her medial calcaneus, malleoli and into her lower leg. One on the left has surface eschar but these are far too painful for me to debridement here. She has a vascular surgery appointment next week to see if anything can be done to help here. I think she has had previous ablations several years ago at [facility]. She has no major edema. She tells me that she did not get product last time [facility]] and went for several days without it. She continues to work in work boots 12 hours a day. She cannot get compression/4-layer under her work boots. 09/20/15 no major change. Periwound edema control was not very good. Her point with pain and vascular is next Wednesday the 25th 09/28/15; the patient is seen vascular surgery and is apparently scheduled for repeat duplex ultrasounds of her bilateral lower legs next week. 10/05/15; the patient was seen by Dr. Doren Custard of vascular surgery. He feels that she should have arterial insufficiency excluded as cause/contributed to her nonhealing stage she is therefore  booked for an arteriogram. She has apparently monophasic  signals in the dorsalis pedis pulses. She also of course has known severe chronic venous insufficiency with previous procedures as noted previously. I had another long discussion with the patient today about her continuing to work 12 hour shifts. I've written her out for 2 months area had concerns about this as her work location is currently undergoing significant turmoil and this may lead to her termination. She is aware of this however I agree with her that she simply cannot continue to stand for 12 hours multiple days a week with the substantial wound areas she has. 10/19/15; the Dr. Doren Custard appointment was largely for an arteriogram which was normal. She does not have an arterial issue. He didn't make a comment about her chronic venous insufficiency for which she has had previous ablations. Presumably it was not felt that anything additional could be done. The patient is now out of work as I prescribed 2 weeks ago. Her wounds look somewhat less aggravated presumably because of this. I felt I would give debridement another try today 10/25/15; no major change in this patient's wounds. We are struggling to get her product that she can afford into her own home through her insurance. 11/01/15; no major change in the patient's wounds. I have been using silver alginate as the most affordable product. I spoke to Dr. Marla Roe last week with her requested take her to the OR for surgical debridement and placement of ACEL. Dr. Marla Roe told me that she would be willing to do this however [facility] will not cover this, fortunately the patient has [place] healthcare of some variant 11/08/15; no major change in the patient's wounds. She has been completely nonviable surface that this but is in too much pain with any attempted debridement are clinic. I have arranged for her to see Dr. Marla Roe ham of plastic surgery and this appointment is on  Monday. I am hopeful that they will take her to the OR for debridement, possible ACEL ultimately possible skin graft 11/22/15 no major change in the patient's wounds over her bilateral medial calcaneus medial malleolus into the lower legs. Surface on these does not look too bad however on the left there is surrounding erythema and tenderness. This may be cellulitis or could him sleepy tinea. 11/29/15; no major changes in the patient's wounds over her bilateral medial malleolus. There is no infection here and I don't think any additional antibiotics are necessary. There is now plan to move forward. She sees Dr. Marla Roe in a week's time for preparation for operative debridement and ACEL placement I believe on 7/12. She then has a follow-up appointment with Dr. Marla Roe on 7/21 12/28/15; the patient returns today having been taken to the [facility] by Dr. Marla Roe 12/12/15 she underwent debridement, intraoperative cultures [which were negative]. She had placement of a wound VAC. Parent really ACEL was not available to be placed. The wound VAC foam apparently adhered to the wound since then she's been using silver alginate, Xeroform under Ace wraps. She still says there is a lot of drainage and a lot of pain 01/31/16; this is a patient I see monthly. I had referred her to Dr. Marla Roe him of plastic surgery for large wounds on her bilateral medial ankles. She has been to the OR twice once in early July and once in early August. She tells me over the last 3 weeks she has been using the wound VAC with ACEL underneath it. On the right we've simply been using silver alginate. Under Kerlix Coban  wraps. 02/28/16; this is a patient I'm currently seeing monthly. She is gone on to have a skin graft over her large venous insufficiency ulcer on the left medial ankle. This was done by Dr. Marla Roe him. The patient is a bit perturbed about why she didn't have one on her right medial ankle wound. She has been using  silver alginate to this. 03/06/16; I received a phone call from her plastic surgery Dr. Marla Roe. She expressed some concern about the viability of the skin graft she did on the left medial ankle wound. Asked me to place Endoform on this. She told me she is not planning to do a subsequent skin graft on the right as the left one did not take very well. I had placed Hydrofera Blue on the right 03/13/16; continue to have a reasonably healthy wound on the right medial ankle. Down to 3 mm in terms of size. There is epithelialization here. The area on the left medial ankle is her skin graft site. I suppose the last week this looks somewhat better. She has an open area inferiorly however in the center there appears to be some viable tissue. There is a lot of surface callus and eschar that will eventually need to come off however none of this looked to be infected. Patient states that the is able to keep the dressing on for several days which is an improvement. 03/20/16 no major change in the circumference of either wound however on the left side the patient was at Dr. Eusebio Friendly office and they did a debridement of left wound. 50% of the wound seems to be epithelialized. I been using Endoform on the left Hydrofera Blue in the right 03/27/16; she arrives today with her wound is not looking as healthy as they did last week. The area on the right clearly has an adherent surface to this a very similar surface on the left. Unfortunately for this patient this is all too familiar problem. Clearly the Endoform is not working and will need to change that today that has some potential to help this surface. She does not tolerate debridement in this clinic very well. She is changing the dressing wants 04/03/16; patient arrives with the wounds looking somewhat better especially on the right. Dr. Migdalia Dk change the dressing to silver alginate when she saw her on Monday and also sold her some compression socks. The  usefulness of the latter is really not clear and woman with severely draining wounds. 04/10/16; the patient is doing a bit of an experiment wearing the compression stockings that Dr. Migdalia Dk provided her to her left leg and the out of legs based dressings that we provided to the right. 05/01/16; the patient is continuing to wear compression stockings Dr. Migdalia Dk provided her on the left that are apparently silver impregnated. She has been using Iodoflex to the right leg wound. Still a moderate amount of drainage, when she leaves here the wraps only last for 4 days. She has to change the stocking on the left leg every night 05/15/16; she is now using compression stockings bilaterally provided by Dr. Marla Roe. She is wearing a nonadherent layer over the wounds so really I don't think there is anything specific being done to this now. She has some reduction on the left wound. The right is stable. I think all healing here is being done without a specific dressing 06/09/16; patient arrives here today with not much change in the wound certainly in diameter to large circular wounds over the medial  aspect of her ankle bilaterally. Under the light of these services are certainly not viable for healing. There is no evidence of surrounding infection. She is wearing compression stockings with some sort of silver impregnation as prescribed by Dr. Marla Roe. She has a follow-up with her tomorrow. 06/30/16; no major change in the size or condition of her wounds. These are still probably covered with a nonviable surface. She is using only her purchase stockings. She did see Dr. Marla Roe who seemed to want to apply Dakin's solution to this I'm not extreme short what value this would be. I would suggest Iodoflex which she still has at home. 07/28/16; I follow Mrs. Loeffel episodically along with Dr. Marla Roe. 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 She  has been applying a topical collagen ointment to both wounds with Adaptic. I don't think Dr. Marla Roe is planning to take her back to the OR. 08/19/16; I follow Mrs. Jeneen Rinks on a monthly basis along with Dr. Marla Roe of plastic surgery. 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 I been following her for a number of years. At one point I was able to get the right medial malleolus wound to heal and had the left medial malleolus down to about half its current size however and I had to send her to plastic surgery for an operative debridement. Since then things have been stable to slightly improve the area on the right is slightly better one in the left about the same although there is much less adherent surface than I'm used to with this patient. She is using some form of liquid collagen gel that Dr. Marla Roe provided a Kerlix cover with the patient's own pressure stockings. She tells me that she has extreme pain in both ankles and along the lateral aspect of both feet. She has been unable to work for some period of time. She is telling me she is retiring at the beginning of April. She sees Dr. Doran Durand of orthopedics next week 09/22/16; patient has not seen Dr. Marla Roe since the last time she is here. I'm not really sure what she is using to the wounds other than bits and pieces of think she had left over including most recently Hydrofera Blue. She is using juxtalite stockings. She is having difficulty with her husband's recent illness "stroke". She is having to transport him to various doctors appointments. Dr. Marla Roe left her the option of a repeat debridement with ACEL however she has not been able to get the time to follow-up on this. She continues to have a fair amount of drainage out of these wounds with certainly precludes leaving dressings on all week 10/13/16; patient has not seen Dr. Marla Roe since she was last in our  clinic. I'm not really sure what she is doing with the wounds, we did try to get her [facility] and I think she is actually using this most of the time. Because of drainage she states she has to change this every second day although this is an improvement from what she used to do. She went to see Dr. Doran Durand who did not think she had a muscular issue with regards to her feet, he referred her to a neurologist and I think the appointment is sometime in June. I changed her back to Iodoflex which she has used in the past but not recently. 11/03/16; the patient has been using Iodoflex although she  ran out of this. Still claims that there is a lot of drainage although the wound does not look like this. No surrounding erythema. She has not been back to see Dr. Marla Roe 11/24/16; the patient has been using Iodoflex again but she ran out of it 2 or 3 days ago. There is no major change in the condition of either one of these wounds in fact they are larger and covered in a thick adherent surface slough/nonviable tissue especially on the left. She does not tolerate mechanical debridement in our clinic. Going back to see Dr. Marla Roe of plastic surgery for an operative debridement would seem reasonable. 12/15/16; the patient has not been back to see Dr. Marla Roe. She is been dealing with a series of illnesses and her husband which of monopolized her time. She is been using Sorbact which we largely supplied. She states the drainage is bad enough that it maximum she can go 2-3 days without changing the dressing 01/12/2017 -- the patient has not been back for about 4 weeks and has not seen Dr. Marla Roe not does she have any appointment pending. 01/23/17; patient has not seen Dr. Marla Roe even though I suggested this previously. She is using Santyl that was suggested last week by Dr. Con Memos this Cost her $16 through her insurance which is indeed surprising 02/12/17; continuing Santyl and the patient is changing  this daily. A lot of drainage. She has not been back to see plastic surgery she is using an Ace wrap. Our intake nurse suggested wrap around stockings which would make a good reasonable alternative 02/26/17; patient is been using Santyl and changing this daily due to drainage. She has not been to see plastic surgery she uses in April Ace wrap to control the edema. She did obtain extremitease stockings but stated that the edema in her leg was to big for these 03/20/17; patient is using Santyl and Anasept. Surfaces looked better today the area on the right is actually measuring a little smaller. She has states she has a lot of pain in her feet and ankles and is asking for a consult to pain control which I'll try to help her with through our case manager. 04/10/17; the patient arrives with better-looking wound surfaces and is slightly smaller wound on the left she is using a combination of Santyl and Anasept. She has an appointment or at least as started in the pain control center associated with [facility] 05/14/17; this is a patient who I followed for a prolonged period of time. 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 At one point I had this down to a much smaller wound on the left however these reopened and we've never been able to get these to heal. She has been using Santyl and Anasept gel although 2 weeks ago she ran out of the Anasept gel. She has a stable appearance of the wound. She is going to the wound care clinic at [facility]. They wanted do a nerve block/spinal block although she tells me she is reluctant to go forward with that. 05/21/17; this is a patient I have followed for many years. 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 Chronic pain and deformity in her ankles as well. She is been to see plastic surgery as well as orthopedics. Using PolyMem AG most recently/Kerramax/ABDs and 2 layer compression. She has managed to  keep this on and she is coming in for a nurse check to change the dressing on Tuesdays,  we see her on Fridays 06/05/17; really quite a good looking surface and the area especially on the right medial has contracted in terms of dimensions. Well granulated healthy-looking tissue on both sides. Even with an open curet there is nothing that even feels abnormal here. This is as good as I've seen this in quite some time. We have been using PolyMem AG and bringing her in for a nurse check 06/12/17; really quite good surface on both of these wounds. The right medial has contracted a bit left is not. We've been using PolyMem and AG and she is coming in for a nurse visit 06/19/17; we have been using PolyMem AG and bringing her in for a nurse check. Dimensions of her wounds are not better but the surfaces looked better bilaterally. She complained of bleeding last night and the left wound and increasing pain bilaterally. She states her wound pain is more neuropathic than just the wounds. There was some suggestion that this was radicular from her pain management doctor in talking to her it is really difficult to sort this out. 06/26/17; using PolyMem and AG and bringing her in for a nurse check as All of this and reasonably stable condition. Certainly not improved. The dimensions on the lateral part of the right leg look better but not really measuring better. The medial aspect on the left is about the same. 07/03/16; we have been using PolyMen AG and bringing her in for a nurse check to change the dressings as the wounds have drainage which precludes once weekly changing. We are using all secondary absorptive dressings.our intake nurse is brought up the idea of using a wound VAC/snap VAC on the wound to help with the drainage to see if this would result in some contraction. This is not a bad idea. The area on the right medial is actually looking smaller. Both wounds have a reasonable-looking surface. There is no evidence  of cellulitis. The edema is well controlled 07/10/17; the patient was denied for a snap VAC by her insurance. The major issue with these wounds continues to be drainage. We are using wicked PolyMem AG and she is coming in for a nurse visit to change this. The wounds are stable to slightly improved. The surface looks vibrant and the area on the right certainly has shrunk in size but very slowly 07/17/17; the patient still has large wounds on her bilateral medial malleoli. Surface of both of these wounds looks better. The dimensions seem to come and go but no consistent improvement. There is no epithelialization. We do not have options for advanced treatment products due to insurance issues. They did not approve of the wound VAC to help control the drainage. More recently we've been using PolyMem and AG wicked to allow drainage through. We have been bringing her in for a nurse visit to change this. We do not have a lot of options for wound care products and the home again due to insurance issues 07/24/17; the patient's wound actually looks somewhat better today. No drainage measurements are smaller still healthy-looking surface. We used silver collagen under PolyMen started last week. We have been bringing her in for a dressing change 07/31/17; patient's wound surface continued to look better and I think there is visible change in the dimensions of the wound on the right. Rims of epithelialization. We have been using silver collagen under PolyMen and bringing her in for a dressing change. There appears to be less drainage although she is still in need  of the dressing change 08/07/17. Patient's wound surface continues to look better on both sides and the area on the right is definitely smaller. We have been using silver collagen and PolyMen. She feels that the drainage has been it has been better. I asked her about her vascular status. She went to see Dr. Aleda Grana at [facility] and had some form of  ablation. I don't have much detail on this. I haven't my notes from 2016 that she was not a candidate for any further ablation but I don't have any more information on this. We had referred her to vein and vascular I don't think she ever went. He does not have a history of PAD although I don't have any information on this either. We don't even have ABIs in our record 08/14/17; we've been using silver collagen and PolyMen cover. And putting the patient and compression. She we are bringing her in as a nurse visit to change this because ofarge amount of drainage. We didn't the ABIs in clinic today since they had been done in many moons 1.2 bilaterally. She has been to see vein and vascular however this was at [facility] and she had ablation although I really don't have any information on this all seemed biking get a report. She is also been operatively debrided by plastic surgery and had a cell placed probably 8-12 months ago. This didn't have a major effect. We've been making some gains with current dressings 08/19/17-She is here in follow-up evaluation for bilateral medial malleoli ulcers. She continues to tolerate debridement very poorly. We will continue with recently changed topical treatment; if no significant improvement may consider switching to Iodosorb/Iodoflex. 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 She has been using silver collagen and PolyMem. I believe she has been used and tried on Iodoflex before. 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 These have been very refractory over time. We have been using silver collagen and PolyMen. She is coming in once a week for a doctor's and once a week for nurse visits. We are actually making some  progress 09/18/17; the patient's wounds are smaller especially on the right medial. She arrives today to upset to consider even washing these off with Anasept which I think is been part of the reason this is been closing. We've been using collagen covered in PolyMen otherwise. It is noted that she has a small area of folliculitis on the right medial calf that. As we are wrapping her legs I'll give her a short course of doxycycline to make sure this doesn't amount to anything. She is a long list of complaints today including imbalance, shortness of breath on exertion, inversion of her left ankle. With regards to the latter complaints she is been to see orthopedics and they offered her a tendon release surgery I believe but wanted her wounds to be closed first. I have recommended she go see her primary physician with regards to everything else. 09/25/17; patient's wounds are about the same size. We have made some progress bilaterally although not in recent weeks. She will not allow me T wash these wounds with Anasept even if she is doing her cell. Wheeze we've been using collagen covered in PolyMen. Last week she had a small area of folliculitis this  is now opened into a small wound. She completed 5 days of trimethoprim sulfamethoxazole 10/02/17; unfortunately the area on her left medial ankle is worse with a larger wound area towards the Achilles. The patient complains of a lot of pain. She will not allow debridement although visually I don't think there is anything to debridement in any case. We have been using silver collagen and PolyMen for several months now. Initially we are making some progress although I'm not really seeing that today. We will move back to [facility]. His admittedly this is a bit of a repeat however I'm hoping that his situation is different now. The patient tells me she had her leg on the left give out on her yesterday this is process some pain. 10/09/17; the patient is seen  twice a week largely because of drainage issues coming out of the chronic medial bimalleolar wounds that are chronic. Last week the dimensions of the one on the left looks a little larger I changed her to [facility]. She comes in today with a history of terrible pain in the bilateral wound areas. She will not allow debridement. She will not even allow a tissue culture. There is no surrounding erythema no no evidence of cellulitis. We have been putting her Kerlix Coban man. She will not allow more aggressive compression as there was a suggestion to put her in 3 layer wraps. 10/16/17; large wounds on her bilateral medial malleoli. These are chronic. Not much change from last week. The surface looks have healthy but absolutely no epithelialization. A lot of pain little less so of drainage. She will not allow debridement or even washing these off in the vigorous fashion with Anasept. 10/23/17; large wounds on her bilateral malleoli which are chronic. Some improvement in terms of size perhaps on the right since last time I saw these. She states that after we increased the 3 layer compression there was some bleeding, when she came in for a nurse visit she did not want 3 layer compression put back on about our nurse managed to convince her. She has known chronic venous visit issues and I'm hoping to get her to tolerate the 3 layer compression. using Hydrofera Blue 10/30/17; absolutely no change in the condition of either wound although we've had some improvement in dimensions on the right.. Attempted to put her in 3 layer compression she didn't tolerated she is back in 2 layer compression. We've been using Hydrofera Blue We looked over her past records. She had venous reflux studies in November 2016. There was no evidence of deep venous reflux on the right. Superficial vein did not show the greater saphenous vein at think this is been previously ablated the small saphenous vein was within normal limits. The  left deep venous system showed no DVT the vessels were positive for deep venous reflux in the posterior tibial veins at the ankle. The greater saphenous vein was surgically absent small saphenous vein was within normal limits. She went to vein and vascular at [facility]. I believe she had an ablation on the left greater saphenous vein. I'll update her reflux studies perhaps ever reviewed by vein and vascular. We've made absolutely no progress in these wounds. Will also try to read and TheraSkins through her insurance 11/06/17; W the patient apparently has a 2 week follow-up with vein and vascular I like him to review the whole issue with regards to her previous vascular workup by Dr. Aleda Grana. We've really made no progress on these wounds  in many months. She arrives today with less viable looking surface on the left medial ankle wound. This was apparently looking about the same on Tuesday when she was here for nurse visit. 11/13/17; deep tissue culture I did last time of the left lower leg showed multiple organisms without any predominating. In particular no Staphylococcus or group A strep were isolated. We sent her for venous reflux studies. She's had a previous left greater saphenous vein stripping and I think sclerotherapy of the right greater saphenous vein. She didn't really look at the lesser saphenous vein this both wounds are on the medial aspect. She has reflux in the common femoral vein and popliteal vein and an accessory vein on the right and the common femoral vein and popliteal vein on the left. I'm going to have her go to see vein and vascular just the look over things and see if anything else beside aggressive compression is indicated here. We have not been able to make any progress on these wounds in spite of the fact that the surface of the wounds is never look too bad. 11/20/17; no major change in the condition of the wounds. Patient reports a large amount of drainage. She has a  lot of complaints of pain although enlisting her today I wonder if some of this at least his neuropathic rather than secondary to her wounds. She has an appointment with vein and vascular on 12/30/17. The refractory nature of these wounds in my mind at least need vein and vascular to look over the wounds the recent reflux studies we did and her history to see if anything further can be done here. I also note her gait is deteriorated quite a bit. 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 She has a bilateral Trendelenburg gait. I wonder if this is neuropathic or perhaps multilevel radicular. 11/27/17; her wounds actually looks slightly better. Healthy-looking granulation tissue a scant amount of epithelialization. [place] healthcare will not pay for [employer]. They will play for tri layer Oasis and Dermagraft. This is not a diabetic ulcer. We'll try for the tri layer Oasis. She still complains of some drainage. She has a vein and vascular appointment on 12/30/17 12/04/17; the wounds visually look quite good. Healthy-looking granulation with some degree of epithelialization. We are still waiting for response to our request for trial to try layer Oasis. Her appointment with vascular to review venous and arterial issues isn't sold the end of July 7/31. Not allow debridement or even vigorous cleansing of the wound surface. 12/18/17; slightly smaller especially on the right. 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 We've been using Hydrofera Blue. We still are looking into triple layer Oasis through her insurance 01/08/18 on evaluation today patient's wound actually appears to be showing signs of good improvement at this point in time. She has been tolerating the dressing changes without complication. Fortunately there does not appear to be any evidence of infection at this point in time. We have been utilizing silver nitrate which does seem to be of benefit for her which is  also good news. Overall I'm very happy with how things seem to be both regards appearance as well as measurement. Patient did see Dr. Bridgett Larsson for evaluation on 12/30/17. In his assessment he felt that stripping would not likely add much more than chronic compression to the patient's healing process. His recommendation was to follow-up in three months with Dr. Doren Custard if she hasn't healed in order to consider referral  back to you and see vascular where she previously was in a trial and was able to get her wound to heal. I'll be see what she feels she when you staying compression and he reiterated this as well. 01/13/18 on evaluation today patient appears to actually be doing very well in regard to her bilateral medial malleolus ulcers. She seems to have tolerated the chemical cauterization with silver nitrate last week she did have some pain through that evening but fortunately states that I'll be see since it seems to be doing better she is overall pleased with the progress. 01/21/18; really quite a remarkable improvement since I've last seen these wounds. We started using silver nitrate specially on the islands of hyper granulation which for some reason her around the wound circumference. This is really done quite nicely. Primary dressing Hydrofera Blue under 4 layer compression. She seems to be able to hold out without a nurse rewrap. Follow-up in 1 week 01/28/18; we've continued the hydrofera blue but continued with chemical cauterization to the wound area that we started about a month ago for irregular hyper granulation. She is made almost stunning improvement in the overall wound dimensions. I was not really expecting this degree of improvement in these chronic wounds 02/05/18; we continue with Hydrofera Bluebut of also continued the aggressive chemical cauterization with silver nitrate. We made nice progress with the right greater than left wound. 02/12/18. We continued with Hydrofera Blue after  aggressive chemical cauterization with silver nitrate. We appear to be making nice progress with both wound areas 02/19/2018; we continue with [facility] after washing the wounds vigorously with Anasept spray and chemical cauterization with silver nitrate. We are making excellent progress. The area on the right's just about closed 02/26/2018. The area on the left medial ankle had too much necrotic debris today. I used a #5 curette we are able to get most of the soft. I continued with the silver nitrate to the much smaller wound on the right medial ankle she had a new area on her right lower pretibial area which she says was due to a role in her compression 03/05/2018; both wound areas look healthy. Not much change in dimensions from last week. I continue to use silver nitrate and Hydrofera Blue. The patient saw Dr. Doren Custard of vein and vascular. He felt she had venous stasis ulcers. He felt based on her previous arteriogram she should have adequate circulation for healing. Also she has deep venous reflux but really no significant correctable superficial venous reflux at this time. He felt we should continue with conservative management including leg elevation and compression 04/02/2018; since we last saw this woman about a month ago she had a fall apparently suffered a pelvic fracture. I did not look up the x-ray. Nevertheless because of pain she literally was bedbound for 2 weeks and had home health coming out to change the dressing. Somewhat predictably this is resulted in considerable improvement in both wound areas. The right is just about closed on the medial malleolus and the left is about half the size. 04/16/2018; both her wounds continue to go down in size. Using Hydrofera Blue. 05/07/18; both her wounds appeared to be improving especially on the right where it is almost closed. We are using Hydrofera Blue 05/14/2018; slightly worse this week with larger wounds. Surface on the left medial not  quite as good. We have been using Hydrofera Blue 05/21/18; again the wounds are slightly larger. Left medial malleolus slightly larger with eschar around  the circumference. We have been using Hydrofera Blue undergoing a wraps for a prolonged period of time. This got a lot better when she was more recumbent due to a fall and a back injury. I change the primary dressing the silver alginate today. She did not tolerate a 4 layer compression previously although I may need to bring this up with her next time 05/28/2018; area on the left medial malleolus again is slightly larger with more drainage. Area on the right is roughly unchanged. She has a small area of folliculitis on the right medial just on the lower calf. This does not look ominous. 06/03/2018 left medial malleolus slightly smaller in a better looking surface. We used silver nitrate on this last time with silver alginate. The area on the right appears slightly smaller 1/10; left medial malleolus slightly smaller. Small open area on the right. We used silver nitrate and silver alginate as of 2 weeks ago. We continue with the wound and compression. These got a lot better when she was off her feet 1/17; right medial malleolus wound is smaller. The left may be slightly smaller. Both surfaces look somewhat better. 1/24; both wounds are slightly smaller. Using silver alginate under Unna boots 1/31; both wounds appear smaller in fact the area on the right medial is just about closed. Surface eschar. We have been using silver alginate under Unna boots. The patient is less active now spends let much less time on her feet and I think this is contributed to the general improvement in the wound condition 2/7; both wounds appear smaller. I was hopeful the right medial would be closed however there there is still the same small open area. Slight amount of surface eschar on the left the dimensions are smaller there is eschar but the wound edges appear to be free.  We have been using silver alginate under Unna boot's 2/14; both wounds once again measure smaller. Circumferential eschar on the left medial. We have been using silver alginate under Unna boots with gradual improvement 2/21; the area on the right medial malleolus has healed. The area on the left is smaller. We have been using silver alginate and Unna boots. We can discharge wrapping the right leg she has 20/30 stockings at home she will need to protect the scar tissue in this area 2/28; the area on the right medial malleolus remains closed the patient has a compression stocking. The area on the left is smaller. We have been using silver alginate and Unna boots. 3/6 the area on the right medial ankle remains closed. Good edema control noted she is using her own compression stocking. The area on the left medial ankle is smaller. We have been managing this with silver alginate and Unna boots which we will continue today. 3/13; the area on the right medial ankle remains closed and I'm declaring it healed today. When necessary the left is about the same still a healthy-looking surface but no major change and wound area. No evidence of infection and using silver alginate under unna and generally making considerable improvement 3/27 the area on the right medial ankle remains closed the area on the left is about the same as last week. Certainly not any worse we have been using silver alginate under an Unna boot 4/3; the area on the right medial ankle remains closed per the patient. We did not look at this wound. The wound on the left medial ankle is about the same surface looks healthy we have been using silver  alginate under an Unna boot 4/10; area on the right medial ankle remains closed per the patient. We did not look at this wound. The wound on the left medial ankle is slightly larger. The patient complains that the [facility] caused burning pain all week. She also told us that she was a lot more  active this week. Changed her back to silver alginate 4/17; right medial ankle still closed per the patient. Left medial ankle is slightly larger. Using silver alginate. She did not tolerate Hydrofera Blue on this area 4/24; right medial ankle remains closed we have not look at this. The left medial ankle continues to get larger today by about a centimeter. We have been using silver alginate under Unna boots. She complains about 4 layer compression as an alternative. She has been up on her feet working on her garden 5/8; right medial ankle remains closed we did not look at this. The left medial ankle has increased in size about 100%. We have been using silver alginate under Unna boots. She noted increased pain this week and was not surprised that the wound is deteriorated 5/15; no major change in SA however much less erythema ( one week of doxy ocellulitis). 5/22-65 year old female returns at 1 week to the clinic for left medial ankle wound for which we have been using silver alginate under 3 layer compression She was placed on DOXY at last visit - the wound is wider at this visit. She is in 3 layer compression 5/29; change to [facility] last week. I had given her empiric doxycycline 2 weeks ago for a week. She is in 3 layer compression. She complains of a lot of pain and drainage on presentation today. 6/5; using Hydrofera Blue. I gave her doxycycline recently empirically for erythema and pain around the wound. Believe her cultures showed enterococcus which not would not have been well covered by doxycycline nevertheless the wound looks better and I don't feel specifically that the enterococcus needs to be covered. She has a new what looks like a wrap injury on her lateral left ankle. 6/12; she is using Hydrofera Blue. She has a new area on the left anterior lower tibial area. This was a wrap injury last week. 6/19; the patient is using Hydrofera Blue. She arrived with marked inflammation and  erythema around the wound and tenderness. 12/01/18 on evaluation today patient appears to be doing a little bit better based on what I'm hearing from the standpoint of lassos evaluation to this as far as the overall appearance of the wound is concerned. Then sometime substandard she typically sees Dr. Dellia Nims. Nonetheless overall very pleased with the progress that she's made up to this point. No fevers, chills, nausea, or vomiting noted at this time. 7/10; some improvement in the surface area. Aggressively debrided last week apparently. I went ahead with the debridement today although the patient does not tolerate this very well. We have been using Iodoflex. Still a fair amount of drainage 7/17; slightly smaller. Using Iodoflex. 7/24; no change from last week in terms of surface area. We have been using Iodoflex. Surface looks and continues to look somewhat better 7/31; surface area slightly smaller better looking surface. We have been using Iodoflex. This is under Unna boot compression 8/7-Patient presents at 1 week with Unna boot and Iodoflex, wound appears better 8/14-Patient presents at 1 week with Iodoflex, we use the Unna boot, wound appears to be stable better.Patient is getting Botox treatment for the inversion of the foot  for tendon release, Next week 8/21; we are using Iodoflex. Unna boot. The wound is stable in terms of surface area. Under illumination there is some areas of the wound that appear to be either epithelialized or perhaps this is adherent slough at this point I was not really clear. It did not wipe off and I was reluctant to debride this today. 8/28; we are using Iodoflex in an Unna boot. Seems to be making good improvement. 9/4; using Iodoflex and wound is slightly smaller. 9/18; we are using Iodoflex with topical silver nitrate when she is here. The wound continues to be smaller 10/2; patient missed her appointment last week due to GI issues. She left and Iodoflex based  dressing on for 2 weeks. Wound is about the same size about the size of a dime on the left medial lower 10/9 we have been using Iodoflex on the medial left ankle wound. She has a new superficial probable wrap injury on the dorsal left ankle 10/16; we have been using Hydrofera Blue since last week. This is on the left medial ankle 10/23; we have been using Hydrofera Blue since 2 weeks ago. This is on the left medial ankle. Dimensions are better 11/6; using Hydrofera Blue. I think the wound is smaller but still not closed. Left medial ankle 11/13; we have been using Hydrofera Blue. Wound is certainly no smaller this week. Also the surface not as good. This is the remanent of a very large area on her left medial ankle. 11/20; using Sorbact since last week. Wound was about the same in terms of size although I was disappointed about the surface debris 12/11; 3-week follow-up. Patient was on vacation. Wound is measuring slightly larger we have been using Sorbact. 12/18; wound is about the same size however surface looks better last week after debridement. We have been using Sorbact under compression 1/15 wound is probably twice the size of last time increased in length nonviable surface. We have been using Sorbact. She was running a mild fever and missed her appointment last week 1/22; the wound is come down in size but under illumination still a very adherent debris we have been Hydrofera Blue that I changed her to last week 1/29; dimensions down slightly. We have been using Hydrofera Blue 2/19 dimensions are the same however there is rims of epithelialization under illumination. Therefore more the surface area may be epithelialized 2/26; the patient's wound actually measures smaller. The wound looks healthy. We have been using Hydrofera Blue. I had some thoughts about running Apligraf then I still may do that however this looks so much better this week we will delay that for now 3/5; the wound is small  but about the same as last week. We have been using Hydrofera Blue. No debridement is required today. 3/19; the wound is about the size of a dime. Healthy looking wound even under illumination. We have been using Hydrofera Blue. No mechanical debridement is necessary 3/26; not much change from last week although still looks very healthy. We have been using Hydrofera Blue under Unna boots Patient was offered an ankle fusion by podiatry but not until the wound heals with a proceed with this. 4/9; the patient comes in today with her original wound on the medial ankle looking satisfactory however she has some uncontrolled swelling in the middle part of her leg with 2 new open areas superiorly just lateral to the tibia. I think this was probably a wrap issue. She said she felt uncomfortable during  the week but did not call in. We have been using Hydrofera Blue 4/16; the wound on the medial ankle is about the same. She has innumerable small areas superior to this across her mid tibia. I think this is probably folliculitis. She is also been working in the yard doing a lot of sweating 4/30; the patient issue on the upper areas across her mid tibia of all healed. I think this was excessive yard work if I remember. Her wound on the medial ankle is smaller. Some debris on this we have been using Hydrofera Blue under Unna boots 5/7; mid tibia. She has been using Hydrofera Blue under an Unna wrap. She is apparently going for her ankle surgery on June 3 10/28/19-Patient returns to clinic with the ankle wound, we are using Hydrofera Blue under Unna wrap, surgery is scheduled for her left foot for June 3 so she will be back for nurse visit next week READMISSION 01/17/2020 Mrs. Pigue is a 65 year old woman we have had in this clinic for a long period of time with severe venous hypertension and refractory wounds on her medial lower legs and ankles bilaterally. 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 When she was off her legs for a prolonged period example when she fell and suffered a compression fracture things would heal up quite nicely. She is now retired and we managed to heal up the right medial leg wound. The left one was very tiny last time I saw this although still refractory. 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 It got to the point where this was interfering with her walking and she elected to proceed with a ankle arthrodesis to straighten her her ankle and leave her with a functional outcome for mobilization. The patient was referred to Dr. Doren Custard and really this took some time to arrange. Dr. Doren Custard saw her on 12/07/2019. Once again he verified that she had no arterial issues. She had previously had an angiogram several years ago. Follow-up ABIs on the left showed an ABI of 1.12 with triphasic waveforms and a TBI of 0.92. She is felt to have chronic deep venous insufficiency but I do not think it was felt that anything could be done from about this from an ablation point of view. At the time Dr. Doren Custard saw this patient the wounds actually look closed via the pictures in his clinic. The patient finally underwent her surgery on 12/15/2019. This went reasonably well and there was a good anatomic outcome. She developed a small distal wound dehiscence on the lateral part of the surgical wound. However more problematically she is developed recurrence of the wound on the medial left ankle. There are actually 2 wounds here one in the distal lower leg and 1 pretty much at the level of the medial malleolus. It is a more distal area that is more problematic. She has been using Hydrofera Blue which started on Friday before this she was simply Ace wrapping. There was a culture done that showed Pseudomonas  and she is on ciprofloxacin. A recent CNS on 8/11 was negative. The patient reports some pain but I generally think this is improving. She is using a cam boot completely nonweightbearing using a walker for pivot transfers and a wheelchair 8/24; not much improvement unfortunately  she has a surgical wound on the lateral part in the venous insufficiency wound medially. The bottom part of the medial insufficiency wound is still necrotic there is exposed tendon here. We have been using Hydrofera Blue under compression. Her edema control is however better 8/31; patient in for follow-up of his surgical wound on the lateral part of her left leg and chronic venous insufficiency ulcers medially. We put her back in compression last week. She comes in today with a complaint of 3 or 4 days worth of increasing pain. She felt her cam walker was rubbing on the area on the back of her heel. However there is intense erythema seems more likely she has cellulitis. She had 2 cultures done when she was seeing podiatry in the postop. One of them in late July showed Pseudomonas and she received a course of ciprofloxacin the other was negative on 8/11 she is allergic to penicillin with anaphylactoid complaints of hives oral swelling via information in epic 9/9; when I saw this patient last week she had intense anterior erythema around her wound on the right lateral heel and ankle and also into the right medial heel. Some of this was no doubt drainage and her walker boot however I was convinced she had cellulitis. I gave her Levaquin and Bactrim she is finishing up on this now. She is following up with Dr. [name] he saw her yesterday. He is taken her out of the walking boot of course she is still nonweightbearing. Her x-ray was negative for any worrisome features such as soft tissue air etc. Things are a lot better this week. She has home health. We have been using Hydrofera Blue under an [employer] which she put back on  yesterday. I did not wrap her last week 9/17; her surrounding skin looks a lot better. In fact the area on the left lateral ankle has just a scant amount of eschar. The only remaining wound is the large area on the left medial ankle. Probably about 60% of this is healthy granulation at the surface however she has a significant divot distally. This has adherent debris in it. I been using debridement and silver collagen to try and get this area to fill-in although I do not think we have made much progress this week 9/24; the patient's wound on the left medial ankle looks a lot better. The deeper divot area distally still requires debridement but this is cleaning up quite nicely we have been using silver collagen. The patient is complaining of swelling in her foot and is worried that that is contributing to the nonhealing of the ankle wound. She is also complaining of numbness in her anterior toes 10/4; left medial ankle. The small area distally still has a divot with necrotic material that I have been debriding away. This has an undermining area. She is approved for Apligraf. She saw Dr. [name] her surgeon on 10/1. I think he declared himself is satisfied with the condition of things. Still nonweightbearing till the end of the month. 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 Her surgical wound is well closed. There is no evidence of infection 10/11; the patient arrived in clinic today with the expectation that we be able to put an Apligraf on this area after debridement however she arrives with a relatively large amount of green drainage on the dressing. The patient states that this started on Friday. She has not been systemically unwell. 10/19; culture I did last week  showed both Enterococcus and Pseudomonas. I think this came in separate parts because I stopped her ciprofloxacin I gave her and prescribed her linezolid however now looking at the final culture result this  is Pseudomonas which is resistant to quinolones. She has not yet picked up the linezolid apparently phone issues. We are also trying to get a topical antibiotic out of [place] in [place] they can be applied by home health. She is still having green drainage 10/16; the patient has her topical antibiotic from [facility] in [place]. This is a compounded gel with vancomycin and ciprofloxacin and gentamicin. We are applying this on the wound bed with silver alginate over the top with Unna boot wraps. She arrives in clinic today with a lot less ominous looking drainage although she is only use this topical preparation once the second time today. She sees Dr. [name] her surgeon on Friday she has home health changing the dressing 11/2; still using her compounded topical antibiotic under silver alginate. Surface is cleaning up there is less drainage. We had an Apligraf for her today and I elected to apply it. A light coating of her antibiotic 04/25/2020 upon evaluation today patient appears to be doing well with regard to her ankle ulcer. There is a little bit of slough noted on the surface of the wound I am can have to perform sharp debridement to clear this away today. With that being said other than that fact overall I feel like she is making progress and we do see some new epithelial growth. There is also some improvement in the depth of the wound and that distal portion. There is little bit of slough there as well. 12/7; 2-week follow-up. Apligraf #3. Dimensions are smaller. Closing in especially inferiorly. Still some surface debris. Still using the [facility] topical antibiotic but I told her that I don't think this needs to be renewed 12/21; 2-week follow-up. Apligraf #4 dimensions are smaller. Nice improvement 06/05/2020; 2-week follow-up. The patient's wound on the left medial ankle looks really excellent. Nice granulation. Advancing epithelialization no undermining no evidence of  infection. We would have to reapply for another Apligraf but with the condition of this wound I did not feel strongly about it. We used Hydrofera Blue under the same degree of compression. She follows up with Dr. [name] her surgeon a week Friday 06/13/2020 upon evaluation today patient appears to be doing excellent in regard to her wound. She has been tolerating the dressing changes without complication. Fortunately there is no signs of active infection at this time. No fevers, chills, nausea, vomiting, or diarrhea. She was using Hydrofera Blue last week. 06/20/2020 06/20/2020 on evaluation today patient actually appears to be doing excellent in regard to her wound. This is measuring better and looking much better as well. She has been using the collagen that seems to be doing better for her as well even though the [facility] was and is not sticking or feeling as rough on her wound. She did see Dr. [name] on Friday he is very pleased he also stated none of the hardware has shifted. That is great news 1/27; the patient has a small clean wound all that is remaining. I agree that this is too small to really consider an Apligraf. Under illumination the surface is looking quite good. We have been using collagen although the dimensions are not any better this week 2/2; the patient has a small clean wound on the left medial ankle. Although this left of her substantial  original areas. Measurements are smaller. We have been using polymen Ag under an Haematologist. 2/10; small area on the left medial ankle. This looks clean nothing to debride however dimensions are about the same we have been using polymen I think now for 2 weeks 2/17; not much change in surface area. We have been using polymen Ag without any improvement. 3/17; 1 month follow-up. The patient has been using endoform without any improvement in fact I think this looks worse with more depth and more expansion 3/24; no improvement. Perhaps less debris  on the surface. We have been using Sorbact for 1 week 4/4; wound measures larger. She has edema in her leg and her foot which she tells as her wrap came down. We have been using Unna boots. Sorbact of the wound. She has been approved for Apligraf 09/12/2020 upon evaluation today patient appears to be doing well with regard to her wound. We did get the Apligraf reapproved which is great news we have that available for application today. Fortunately there is no signs of infection and overall the patient seems to be doing great. The wound bed is nice and clean. 4/27; patient presents for her second application of Apligraf. She states over the past week she has been on her feet more often due to being outside in her garden. She has noted more swelling to her foot as a result. She denies increased warmth, pain or erythema to the wound site. 10/10/2020 upon evaluation today patient appears to be doing well with regard to her wound which does not appear to be quite as irritated as last week from what I am hearing. With that being said unfortunately she is having issues with some erythema and warmth to touch as well as an increase size. I do believe this likely is infected. 10/17/2020 upon evaluation today patient appears to be doing excellent in regard to her wound this is significantly improved as compared to last week. Fortunately I think that the infection is much better controlled at this point. She did have evidence of both Enterococcus as well as Staphylococcus noted on culture. Enterococcus really would not be helped significantly by the Cipro but the wound is doing so much better I am under the assumption that the Staphylococcus is probably the main organism that is causing the current infection. Nonetheless I think that she is doing excellent as far as that is concerned and I am very pleased in that regard. I would therefore recommend she continue with the Cipro. 10/31/2020 upon evaluation today patient  appears to be doing well with regard to her wound. She has been tolerating the dressing changes without complication. Fortunately there is no signs of active infection and overall I am extremely pleased with where things stand today. No fevers, chills, nausea, vomiting, or diarrhea. With that being said she does have some green drainage coming from the wound and although it looks okay I am a little concerned about the possibility of a continuing infection. Specifically with Pseudomonas. For that reason I will go ahead and send in a prescription for Cipro for her to be continued. 11/14/2020 upon evaluation today patient appears to be doing very well currently in regard to her wound on her leg. She has been tolerating the dressing changes without complication. Fortunately I feel like the infection is finally under good control here. Unfortunately we do not have the Apligraf for application today although we can definitely order to have it in place for next week. That will  be her fifth and final of the current series. Nonetheless I feel like her wound is really doing quite well which is great news. Objective Constitutional Well-nourished and well-hydrated in no acute distress. Vitals Time Taken: 10:35 AM, Height: 68 in, Temperature: 98.4 F, Pulse: 64 bpm, Respiratory Rate: 17 breaths/min, Blood Pressure: 144/80 mmHg. Respiratory normal breathing without difficulty. Psychiatric this patient is able to make decisions and demonstrates good insight into disease process. Alert and Oriented x 3. pleasant and cooperative. General Notes: Upon inspection patient's wound bed actually showed signs of good granulation epithelization at this point. There does not appear to be any signs of infection which is great news and overall I am extremely pleased with where we stand at this point. I did perform some sharp debridement to clear away some of the necrotic debris she tolerated that today without complication  postdebridement wound bed appears to be doing much better. Integumentary (Hair, Skin) Wound #15 status is Open. Original cause of wound was Gradually Appeared. The date acquired was: 12/30/2019. The wound has been in treatment 43 weeks. The wound is located on the Left,Medial Malleolus. The wound measures 3.2cm length x 2.5cm width x 0.1cm depth; 6.283cm^2 area and 0.628cm^3 volume. There is Fat Layer (Subcutaneous Tissue) exposed. There is no tunneling or undermining noted. There is a medium amount of serosanguineous drainage noted. The wound margin is distinct with the outline attached to the wound base. There is medium (34-66%) red, pink granulation within the wound bed. There is a medium (34-66%) amount of necrotic tissue within the wound bed including Adherent Slough. 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 There was a Excisional Skin/Subcutaneous Tissue Debridement with a total area of 8 sq cm performed by [name], PA. With the following instrument(s): Curette to remove Viable and Non-Viable tissue/material. Material removed includes Subcutaneous Tissue and Slough and after achieving pain control using Lidocaine 5% topical ointment. No specimens were taken. A time out was conducted at 11:05, prior to the start of the procedure. A Minimum amount of bleeding was controlled with Pressure. The procedure was tolerated well with a pain level of 3 throughout and a pain level of 2 following the procedure. Post Debridement Measurements: 3.2cm length x 2.5cm width x 0.1cm depth;  0.628cm^3 volume. Character of Wound/Ulcer Post Debridement is improved. Severity of Tissue Post Debridement is: Fat layer exposed. Post procedure Diagnosis Wound #15: Same as Pre-Procedure Pre-procedure diagnosis of Wound #15 is a Venous Leg Ulcer located on the Left,Medial Malleolus . There was a Haematologist Compression Therapy Procedure by [name], RN. Post procedure Diagnosis Wound #15: Same as Pre-Procedure Plan Follow-up Appointments: Return Appointment in 1 week. - Apligraf next week Cellular or Tissue Based Products: Cellular or Tissue Based Product Type: Bathing/ Shower/ Hygiene: May shower with protection but do not get wound dressing(s) wet. Edema Control - Lymphedema / SCD / Other: Elevate legs to the level of the heart or above for 30 minutes daily and/or when sitting, a frequency of: - throughout the day Avoid standing for long periods of time. Exercise regularly Compression stocking or Garment 20-30 mm/Hg pressure to: - right leg daily  WOUND #15: - Malleolus Wound Laterality: Left, Medial Cleanser: Soap and Water 1 x Per Week/30 Days Discharge Instructions: May shower and wash wound with dial antibacterial soap and water prior to dressing change. Peri-Wound Care: Triamcinolone 15 (g) 1 x Per Week/30 Days Discharge Instructions: Use triamcinolone 15 (g) mixed with lotion Peri-Wound Care: Sween Lotion (Moisturizing lotion) 1 x Per Week/30 Days Discharge Instructions: Apply moisturizing lotion as directed Prim Dressing: Promogran Prisma Matrix, 4.34 (sq in) (silver collagen) 1 x Per Week/30 Days ary Discharge Instructions: Moisten collagen with saline or hydrogel Secondary Dressing: Woven Gauze Sponge, Non-Sterile 4x4 in 1 x Per Week/30 Days Discharge Instructions: Apply over primary dressing as directed. Secondary Dressing: ABD Pad, 5x9 1 x Per Week/30 Days Discharge Instructions: Apply over primary dressing as directed. Com pression Wrap: Unnaboot w/Calamine,  4x10 (in/yd) 1 x Per Week/30 Days Discharge Instructions: Apply Unnaboot as directed. 1. Would recommend that we going continue with the wound care measures as before and the patient is in agreement with the plan. This includes the use of the silver collagen for the time being I think this is absolutely appropriate. 2. I am also can recommend that we have the patient continue to elevate her legs is much as possible. Obviously this will help with edema control she does try to do that that also. 3. We will plan for reapplication of the Apligraf #5 next week. We will see patient back for reevaluation in 1 week here in the clinic. If anything worsens or changes patient will contact our office for additional recommendations. Electronic Signature(s) Signed: 11/14/2020 12:58:04 PM By: [name] PA-C Entered By: [name] on 11/14/2020 12:58:04 -------------------------------------------------------------------------------- SuperBill Details Patient Name: Date of Service: [name], [name] 11/14/2020 Medical Record Number: [id number] Patient Account Number: [id number] Date of Birth/Sex: Treating RN: 07-08-1955 (65 y.o. [name] Provider: [name] Other Clinician: Referring Provider: Treating Provider/Extender: [name] in Treatment: 18 Diagnosis Coding ICD-10 Codes Code Description [phone] Chronic venous hypertension (idiopathic) with ulcer and inflammation of left lower extremity L97.828 Non-pressure chronic ulcer of other part of left lower leg with other specified severity L97.328 Non-pressure chronic ulcer of left ankle with other specified severity Facility Procedures CPT4 Code: [id number] Description: [id number] - DEB SUBQ TISSUE 20 SQ CM/< ICD-10 Diagnosis Description L97.828 Non-pressure chronic ulcer of other part of left lower leg with other specified Modifier: severity Quantity: 1 Physician Procedures : CPT4 Code  Description Modifier [id number] - WC PHYS SUBQ TISS 20 SQ CM ICD-10 Diagnosis Description L97.828 Non-pressure chronic ulcer of other part of left lower leg with other specified severity Quantity: 1 Electronic Signature(s) Signed: 11/14/2020 12:58:24 PM By: [name] PA-C Entered By: [name] on 11/14/2020 12:58:24

## 2020-11-15 NOTE — Progress Notes (Signed)
BRENLEIGH, COLLET ([id number]) Visit Report for 11/14/2020 Arrival Information Details Patient Name: Date of Service: [facility], [name] 11/14/2020 10:00 A M Medical Record Number: [id number] Patient Account Number: [id number] Date of Birth/Sex: Treating RN: 03-Aug-1955 (65 y.o. [name] Mindy Behnken: [name] Other Clinician: Referring Alante Weimann: Treating Mitali Shenefield/Extender: [name] in Treatment: 66 Visit Information History Since Last Visit Added or deleted any medications: No Patient Arrived: Ambulatory Any new allergies or adverse reactions: No Arrival Time: 10:35 Had a fall or experienced change in No Accompanied By: self activities of daily living that may affect Transfer Assistance: None risk of falls: Patient Identification Verified: Yes Signs or symptoms of abuse/neglect since last visito No Secondary Verification Process Completed: Yes Hospitalized since last visit: No Patient Requires Transmission-Based Precautions: No Implantable device outside of the clinic excluding No Patient Has Alerts: Yes cellular tissue based products placed in the center Patient Alerts: L ABI =1.12, TBI = .92 since last visit: R ABI= 1.02, TBI= .58 Has Dressing in Place as Prescribed: Yes Pain Present Now: Yes Electronic Signature(s) Signed: 11/14/2020 1:43:18 PM By: [name] Entered By: [name] on 11/14/2020 10:35:59 -------------------------------------------------------------------------------- Compression Therapy Details Patient Name: Date of Service: [name], [name] 11/14/2020 10:00 A M Medical Record Number: [id number] Patient Account Number: [id number] Date of Birth/Sex: Treating RN: [birth year] (65 y.o. [name] Alannah Averhart: [name] Other Clinician: Referring Najee Cowens: Treating Williom Cedar/Extender: [name] in Treatment: 63 Compression Therapy Performed for  Wound Assessment: Wound #15 Left,Medial Malleolus Performed By: Clinician [name], RN Compression Type: Rolena Infante Post Procedure Diagnosis Same as Pre-procedure Electronic Signature(s) Signed: 11/14/2020 6:34:46 PM By: Baruch Gouty RN, BSN Entered By: Baruch Gouty on 11/14/2020 11:12:36 -------------------------------------------------------------------------------- Lower Extremity Assessment Details Patient Name: Date of Service: [facility] [name], [name] 11/14/2020 10:00 A M Medical Record Number: [id number] Patient Account Number: [id number] Date of Birth/Sex: Treating RN: 1955-09-02 (66 y.o. [name] Primary Care Napolean Sia: [name] Other Clinician: Referring Taimane Stimmel: Treating Breckyn Troyer/Extender: [name] Weeks in Treatment: 41 Edema Assessment Assessed: [Left: Yes] [Right: No] Edema: [Left: N] [Right: o] Calf Left: Right: Point of Measurement: 31 cm From Medial Instep 27 cm Ankle Left: Right: Point of Measurement: 11 cm From Medial Instep 22 cm Vascular Assessment Pulses: Dorsalis Pedis Palpable: [Left:Yes] Electronic Signature(s) Signed: 11/15/2020 5:31:56 PM By: Deon Pilling Entered By: Deon Pilling on 11/14/2020 10:55:57 -------------------------------------------------------------------------------- [place] Details Patient Name: Date of Service: [facility] [name], [name] 11/14/2020 10:00 A M Medical Record Number: [id number] Patient Account Number: [id number] Date of Birth/Sex: Treating RN: Aug 25, 1955 (65 y.o. [name] Inocente Krach: [name] Other Clinician: Referring Merrillyn Ackerley: Treating Revella Shelton/Extender: [name] in Treatment: 40 Multidisciplinary Care Plan reviewed with physician Active Inactive Venous Leg Ulcer Nursing Diagnoses: Actual venous Insuffiency (use after diagnosis is confirmed) Knowledge deficit related to disease process and  management Goals: Patient will maintain optimal edema control Date Initiated: 06/20/2020 Target Resolution Date: 12/05/2020 Goal Status: Active Interventions: Assess peripheral edema status every visit. Compression as ordered Treatment Activities: Therapeutic compression applied : 06/20/2020 Notes: Wound/Skin Impairment Nursing Diagnoses: Impaired tissue integrity Knowledge deficit related to ulceration/compromised skin integrity Goals: Patient/caregiver will verbalize understanding of skin care regimen Date Initiated: 01/17/2020 Target Resolution Date: 12/05/2020 Goal Status: Active Ulcer/skin breakdown will have a volume reduction of 30% by week 4 Date Initiated: 01/17/2020  Date Inactivated: 03/12/2020 Target Resolution Date: 03/09/2020 Goal Status: Met Interventions: Assess patient/caregiver ability to obtain necessary supplies Assess patient/caregiver ability to perform ulcer/skin care regimen upon admission and as needed Assess ulceration(s) every visit Provide education on ulcer and skin care Notes: Electronic Signature(s) Signed: 11/14/2020 6:34:46 PM By: Baruch Gouty RN, BSN Entered By: Baruch Gouty on 11/14/2020 11:10:22 -------------------------------------------------------------------------------- Pain Assessment Details Patient Name: Date of Service: [name], [name] 11/14/2020 10:00 [place] Record Number: [id number] Patient Account Number: [id number] Date of Birth/Sex: Treating RN: 1955-09-11 (65 y.o. [name] Jakiera Ehler: [name] Other Clinician: Referring Alicya Bena: Treating Jalie Eiland/Extender: [name] in Treatment: 77 Active Problems Location of Pain Severity and Description of Pain Patient Has Paino No Site Locations Pain Management and Medication Current Pain Management: Electronic Signature(s) Signed: 11/14/2020 1:43:18 PM By: [name] Signed: 11/14/2020 6:34:46 PM By: Baruch Gouty RN, BSN Entered By: [name] on 11/14/2020 10:36:47 -------------------------------------------------------------------------------- Patient/Caregiver Education Details Patient Name: Date of Service: [name], [name] 6/15/2022andnbsp10:00 A M Medical Record Number: [id number] Patient Account Number: [id number] Date of Birth/Gender: Treating RN: 1955-08-09 (65 y.o. [name] Physician: [name] Other Clinician: Referring Physician: Treating Physician/Extender: [name] in Treatment: 87 Education Assessment Education Provided To: Patient Education Topics Provided Venous: Methods: Explain/Verbal Responses: Reinforcements needed, State content correctly Wound/Skin Impairment: Methods: Explain/Verbal Responses: Reinforcements needed, State content correctly Electronic Signature(s) Signed: 11/14/2020 6:34:46 PM By: Baruch Gouty RN, BSN Entered By: Baruch Gouty on 11/14/2020 11:12:08 -------------------------------------------------------------------------------- Wound Assessment Details Patient Name: Date of Service: [name], [name] 11/14/2020 10:00 A M Medical Record Number: [id number] Patient Account Number: [id number] Date of Birth/Sex: Treating RN: January 21, 1956 (65 y.o. [name] Kiani Wurtzel: [name] Other Clinician: Referring Harper Vandervoort: Treating Asako Saliba/Extender: [name] in Treatment: 98 Wound Status Wound Number: 15 Primary Venous Leg Ulcer Etiology: Wound Location: Left, Medial Malleolus Wound Status: Open Wounding Event: Gradually Appeared Comorbid Peripheral Venous Disease, Osteoarthritis, Confinement Date Acquired: 12/30/2019 History: Anxiety Weeks Of Treatment: 43 Clustered Wound: No Photos Wound Measurements Length: (cm) 3.2 Width: (cm) 2.5 Depth: (cm) 0.1 Area: (cm) 6.283 Volume: (cm) 0.628 % Reduction in Area:  46.1% % Reduction in Volume: 93.3% Epithelialization: Small (1-33%) Tunneling: No Undermining: No Wound Description Classification: Full Thickness With Exposed Support Structures Wound Margin: Distinct, outline attached Exudate Amount: Medium Exudate Type: Serosanguineous Exudate Color: red, brown Foul Odor After Cleansing: No Slough/Fibrino Yes Wound Bed Granulation Amount: Medium (34-66%) Exposed Structure Granulation Quality: Red, Pink Fascia Exposed: No Necrotic Amount: Medium (34-66%) Fat Layer (Subcutaneous Tissue) Exposed: Yes Necrotic Quality: Adherent Slough Tendon Exposed: No Muscle Exposed: No Joint Exposed: No Bone Exposed: No Electronic Signature(s) Signed: 11/15/2020 5:21:02 PM By: [name] Signed: 11/15/2020 5:31:32 PM By: Baruch Gouty RN, BSN Previous Signature: 11/14/2020 6:34:46 PM Version By: Baruch Gouty RN, BSN Entered By: [name] on 11/15/2020 15:15:39 -------------------------------------------------------------------------------- Vitals Details Patient Name: Date of Service: [name], [name] 11/14/2020 10:00 A M Medical Record Number: [id number] Patient Account Number: [id number] Date of Birth/Sex: Treating RN: 1956/01/30 (65 y.o. [name] Koi Yarbro: [name] Other Clinician: Referring Ilamae Geng: Treating Aahana Elza/Extender: [name] in Treatment: 22 Vital Signs Time Taken: 10:35 Temperature (F): 98.4 Height (in): 68 Pulse (bpm): 64 Respiratory Rate (breaths/min): 17 Blood Pressure (mmHg): 144/80 Reference Range: 80 - 120 mg / dl  Electronic Signature(s) Signed: 11/14/2020 1:43:18 PM By: [name] Entered By: [name] on 11/14/2020 10:36:21

## 2020-11-21 ENCOUNTER — Encounter (HOSPITAL_BASED_OUTPATIENT_CLINIC_OR_DEPARTMENT_OTHER): Payer: Medicare Other | Admitting: Physician Assistant

## 2020-11-21 ENCOUNTER — Other Ambulatory Visit: Payer: Self-pay

## 2020-11-21 DIAGNOSIS — I87332 Chronic venous hypertension (idiopathic) with ulcer and inflammation of left lower extremity: Secondary | ICD-10-CM | POA: Diagnosis not present

## 2020-11-21 NOTE — Progress Notes (Signed)
[name], [name] ([id number]) Visit Report for 11/21/2020 Arrival Information Details Patient Name: Date of Service: [facility], [name] 11/21/2020 10:00 A M Medical Record Number: [id number] Patient Account Number: [id number] Date of Birth/Sex: Treating RN: 10/11/1955 (65 y.o. [name] Suriya Kovarik: [name] Other Clinician: Referring Jeriah Corkum: Treating Sharisse Rantz/Extender: [name] in Treatment: 34 Visit Information History Since Last Visit Added or deleted any medications: No Patient Arrived: Cane Any new allergies or adverse reactions: No Arrival Time: 10:38 Had a fall or experienced change in No Accompanied By: self activities of daily living that may affect Transfer Assistance: None risk of falls: Patient Identification Verified: Yes Signs or symptoms of abuse/neglect since last visito No Secondary Verification Process Completed: Yes Hospitalized since last visit: No Patient Requires Transmission-Based Precautions: No Implantable device outside of the clinic excluding No Patient Has Alerts: Yes cellular tissue based products placed in the center Patient Alerts: L ABI =1.12, TBI = .92 since last visit: R ABI= 1.02, TBI= .58 Has Dressing in Place as Prescribed: Yes Pain Present Now: Yes Electronic Signature(s) Signed: 11/21/2020 4:31:55 PM By: [name] Entered By: [name] on 11/21/2020 10:39:08 -------------------------------------------------------------------------------- Compression Therapy Details Patient Name: Date of Service: [name], [name] 11/21/2020 10:00 A M Medical Record Number: [id number] Patient Account Number: [id number] Date of Birth/Sex: Treating RN: Mar 20, 1956 (65 y.o. [name] Saivon Prowse: [name] Other Clinician: Referring Asuna Peth: Treating Imanie Darrow/Extender: [name] in Treatment: 63 Compression Therapy Performed for Wound  Assessment: Wound #15 Left,Medial Malleolus Performed By: Clinician [name], RN Compression Type: Rolena Infante Post Procedure Diagnosis Same as Pre-procedure Electronic Signature(s) Signed: 11/21/2020 5:44:00 PM By: Baruch Gouty RN, BSN Entered By: Baruch Gouty on 11/21/2020 11:08:57 -------------------------------------------------------------------------------- Encounter Discharge Information Details Patient Name: Date of Service: [name], [name] 11/21/2020 10:00 A M Medical Record Number: [id number] Patient Account Number: [id number] Date of Birth/Sex: Treating RN: 12-27-1955 (65 y.o. Tonita [name], [name] Primary Care [name]: [name] Other Clinician: Referring [name]: Treating Cedrica [name]/Extender: [name] in Treatment: 58 Encounter Discharge Information Items Post Procedure Vitals Discharge Condition: Stable Temperature (F): 98.2 Ambulatory Status: Ambulatory Pulse (bpm): 57 Discharge Destination: Home Respiratory Rate (breaths/min): 17 Transportation: Private Auto Blood Pressure (mmHg): 130/68 Accompanied By: self Schedule Follow-up Appointment: Yes Clinical Summary of Care: Patient Declined Electronic Signature(s) Signed: 11/21/2020 5:40:17 PM By: [name] RN Entered By: [name] on 11/21/2020 12:05:00 -------------------------------------------------------------------------------- Lower Extremity Assessment Details Patient Name: Date of Service: [name], [name] 11/21/2020 10:00 A M Medical Record Number: [id number] Patient Account Number: [id number] Date of Birth/Sex: Treating RN: September 27, 1955 (65 y.o. [name] Primary Care Masiah Lewing: [name] Other Clinician: Referring Shatisha Falter: Treating [name]/Extender: [name] Weeks in Treatment: 44 Edema Assessment Assessed: [Left: Yes] [Right: No] Edema: [Left: Ye] [Right: s] Calf Left: Right: Point of  Measurement: 31 cm From Medial Instep 27 cm Ankle Left: Right: Point of Measurement: 11 cm From Medial Instep 22 cm Vascular Assessment Pulses: Dorsalis Pedis Palpable: [Left:Yes] Electronic Signature(s) Signed: 11/21/2020 5:52:09 PM By: Deon Pilling Entered By: Deon Pilling on 11/21/2020 10:52:24 -------------------------------------------------------------------------------- Multi-Disciplinary Care Plan Details Patient Name: Date of Service: [name], [name] 11/21/2020 10:00 A M Medical Record Number: [id number] Patient Account Number: [id number] Date of Birth/Sex: Treating RN: April 22, 1956 (65 y.o. [name] [name]: [name] Other Clinician: Referring [name]: Treating  [name]/Extender: [name] Weeks in Treatment: 81 Multidisciplinary Care Plan reviewed with physician Active Inactive Venous Leg Ulcer Nursing Diagnoses: Actual venous Insuffiency (use after diagnosis is confirmed) Knowledge deficit related to disease process and management Goals: Patient will maintain optimal edema control Date Initiated: 06/20/2020 Target Resolution Date: 12/05/2020 Goal Status: Active Interventions: Assess peripheral edema status every visit. Compression as ordered Treatment Activities: Therapeutic compression applied : 06/20/2020 Notes: Wound/Skin Impairment Nursing Diagnoses: Impaired tissue integrity Knowledge deficit related to ulceration/compromised skin integrity Goals: Patient/caregiver will verbalize understanding of skin care regimen Date Initiated: 01/17/2020 Target Resolution Date: 12/05/2020 Goal Status: Active Ulcer/skin breakdown will have a volume reduction of 30% by week 4 Date Initiated: 01/17/2020 Date Inactivated: 03/12/2020 Target Resolution Date: 03/09/2020 Goal Status: Met Interventions: Assess patient/caregiver ability to obtain necessary supplies Assess patient/caregiver ability to perform ulcer/skin  care regimen upon admission and as needed Assess ulceration(s) every visit Provide education on ulcer and skin care Notes: Electronic Signature(s) Signed: 11/21/2020 5:44:00 PM By: Baruch Gouty RN, BSN Entered By: Baruch Gouty on 11/21/2020 11:05:35 -------------------------------------------------------------------------------- Pain Assessment Details Patient Name: Date of Service: [name], [name] 11/21/2020 10:00 A M Medical Record Number: [id number] Patient Account Number: [id number] Date of Birth/Sex: Treating RN: 01/19/1956 (65 y.o. [name] Sederick Jacobsen: [name] Other Clinician: Referring Jonatan Wilsey: Treating Trig Mcbryar/Extender: [name] in Treatment: 38 Active Problems Location of Pain Severity and Description of Pain Patient Has Paino Yes Site Locations Rate the pain. Current Pain Level: 4 Pain Management and Medication Current Pain Management: Electronic Signature(s) Signed: 11/21/2020 4:31:55 PM By: [name] Signed: 11/21/2020 5:44:00 PM By: Baruch Gouty RN, BSN Entered By: [name] on 11/21/2020 10:39:47 -------------------------------------------------------------------------------- Patient/Caregiver Education Details Patient Name: Date of Service: [name], [name] 6/22/2022andnbsp10:00 A M Medical Record Number: [id number] Patient Account Number: [id number] Date of Birth/Gender: Treating RN: November 07, 1955 (65 y.o. [name] Physician: [name] Other Clinician: Referring Physician: Treating Physician/Extender: [name] in Treatment: 69 Education Assessment Education Provided To: Patient Education Topics Provided Venous: Methods: Explain/Verbal Responses: Reinforcements needed, State content correctly Wound/Skin Impairment: Methods: Explain/Verbal Responses: Reinforcements needed, State content correctly Electronic  Signature(s) Signed: 11/21/2020 5:44:00 PM By: Baruch Gouty RN, BSN Entered By: Baruch Gouty on 11/21/2020 11:06:32 -------------------------------------------------------------------------------- Wound Assessment Details Patient Name: Date of Service: [name], [name] 11/21/2020 10:00 A M Medical Record Number: [id number] Patient Account Number: [id number] Date of Birth/Sex: Treating RN: 01/28/1956 (65 y.o. [name] Milaina Sher: [name] Other Clinician: Referring Gwenith Tschida: Treating Starlet Gallentine/Extender: [name] in Treatment: 41 Wound Status Wound Number: 15 Primary Venous Leg Ulcer Etiology: Wound Location: Left, Medial Malleolus Wound Status: Open Wounding Event: Gradually Appeared Comorbid Peripheral Venous Disease, Osteoarthritis, Confinement Date Acquired: 12/30/2019 History: Anxiety Weeks Of Treatment: 44 Clustered Wound: No Photos Wound Measurements Length: (cm) 3.2 Width: (cm) 2.5 Depth: (cm) 0.1 Area: (cm) 6.283 Volume: (cm) 0.628 % Reduction in Area: 46.1% % Reduction in Volume: 93.3% Epithelialization: Small (1-33%) Tunneling: No Undermining: No Wound Description Classification: Full Thickness With Exposed Support Structures Wound Margin: Distinct, outline attached Exudate Amount: Medium Exudate Type: Serosanguineous Exudate Color: red, brown Foul [name] After Cleansing: No Slough/Fibrino Yes Wound Bed Granulation Amount: Large (67-100%) Exposed Structure Granulation Quality: Red, Pink Fascia Exposed: No Necrotic Amount: Small (1-33%) Fat Layer (Subcutaneous Tissue) Exposed: Yes Necrotic Quality: Adherent Slough Tendon Exposed: No Muscle Exposed: No  Joint Exposed: No Bone Exposed: No Treatment Notes Wound #15 (Malleolus) Wound Laterality: Left, Medial Cleanser Soap and Water Discharge Instruction: May shower and wash wound with dial antibacterial soap and water prior to dressing  change. Peri-Wound Care Triamcinolone 15 (g) Discharge Instruction: Use triamcinolone 15 (g) mixed with lotion Sween Lotion (Moisturizing lotion) Discharge Instruction: Apply moisturizing lotion as directed Topical Primary Dressing Secondary Dressing Woven Gauze Sponge, Non-Sterile 4x4 in Discharge Instruction: Apply over primary dressing as directed. ABD Pad, 5x9 Discharge Instruction: Apply over primary dressing as directed. Secured With Compression Wrap Unnaboot w/Calamine, 4x10 (in/yd) Discharge Instruction: Apply Unnaboot as directed. Compression Stockings Add-Ons Electronic Signature(s) Signed: 11/21/2020 4:31:55 PM By: [name] Signed: 11/21/2020 5:44:00 PM By: Baruch Gouty RN, BSN Entered By: [name] on 11/21/2020 16:13:18 -------------------------------------------------------------------------------- [place] Details Patient Name: Date of Service: [name], [name] 11/21/2020 10:00 A M Medical Record Number: [id number] Patient Account Number: [id number] Date of Birth/Sex: Treating RN: [birth year] (65 y.o. [name] Vincent Ehrler: [name] Other Clinician: Referring Mahitha Hickling: Treating Micharl Helmes/Extender: [name] in Treatment: 92 Vital Signs Time Taken: 10:39 Temperature (F): 98.2 Height (in): 68 Pulse (bpm): 57 Respiratory Rate (breaths/min): 17 Blood Pressure (mmHg): 130/68 Reference Range: 80 - 120 mg / dl Electronic Signature(s) Signed: 11/21/2020 4:31:55 PM By: [name] Entered By: [name] on 11/21/2020 10:39:29

## 2020-11-21 NOTE — Progress Notes (Addendum)
PRENTICE, TARR ([id number]) Visit Report for 11/21/2020 Chief Complaint Document Details Patient Name: Date of Service: [name], [name] 11/21/2020 10:00 A M Medical Record Number: [id number] Patient Account Number: [id number] Date of Birth/Sex: Treating RN: 1955-11-13 (65 y.o. [name] Primary Care Provider: [name] Other Clinician: Referring Provider: Treating Provider/Extender: [name] in Treatment: 27 Information Obtained from: Patient Chief Complaint patient is been followed long-term in this clinic for venous insufficiency ulcers with inflammation, hypertension and ulceration over the medial ankle bilaterally. 01/17/2020; this is a patient who is here for review of postoperative wounds on the left lateral ankle and recurrence of venous stasis ulceration on the left medial Electronic Signature(s) Signed: 11/21/2020 10:54:07 AM By: [name] PA-C Entered By: [name] on 11/21/2020 10:54:07 -------------------------------------------------------------------------------- Cellular or Tissue Based Product Details Patient Name: Date of Service: [name], [name] 11/21/2020 10:00 [place] Record Number: [id number] Patient Account Number: [id number] Date of Birth/Sex: Treating RN: 1955-07-24 (65 y.o. [name] Primary Care Provider: [name] Other Clinician: Referring Provider: Treating Provider/Extender: [name] in Treatment: 62 Cellular or Tissue Based Product Type Wound #15 Left,Medial Malleolus Applied to: Performed By: Physician [name], PA Cellular or Tissue Based Product Type: Apligraf Level of Consciousness (Pre-procedure): Awake and Alert Pre-procedure Verification/Time Out Yes - 11:10 Taken: Location: trunk / arms / legs Wound Size (sq cm): 8 Product Size (sq cm): 44 Waste Size (sq cm): 22 Waste Reason: wound size Amount of Product Applied (sq cm):  22 Instrument Used: Forceps, Scissors Lot #: [id number] Order #: [id number] Expiration Date: 11/28/2020 Fenestrated: Yes Instrument: Blade Reconstituted: Yes Solution Type: saline Solution Amount: 5 ml Lot #: [id number] Solution Expiration Date: 01/31/2022 Secured: Yes Secured With: Steri-Strips Dressing Applied: Yes Primary Dressing: adaptic, gauze, unna Procedural Pain: 0 Post Procedural Pain: 0 Response to Treatment: Procedure was tolerated well Level of Consciousness (Post- Awake and Alert procedure): Post Procedure Diagnosis Same as Pre-procedure Electronic Signature(s) Signed: 11/21/2020 4:01:47 PM By: [name] PA-C Signed: 11/21/2020 5:44:00 PM By: Baruch Gouty RN, BSN Entered By: Baruch Gouty on 11/21/2020 11:14:59 -------------------------------------------------------------------------------- Debridement Details Patient Name: Date of Service: [name] [name], [name] 11/21/2020 10:00 A M Medical Record Number: [id number] Patient Account Number: [id number] Date of Birth/Sex: Treating RN: 1955-08-09 (65 y.o. [name] Primary Care Provider: [name] Other Clinician: Referring Provider: Treating Provider/Extender: [name] in Treatment: 59 Debridement Performed for Assessment: Wound #15 Left,Medial Malleolus Performed By: Physician [name], PA Debridement Type: Debridement Severity of Tissue Pre Debridement: Fat layer exposed Level of Consciousness (Pre-procedure): Awake and Alert Pre-procedure Verification/Time Out Yes - 11:05 Taken: Start Time: 11:07 Pain Control: Lidocaine 4% T opical Solution T Area Debrided (L x W): otal 3.2 (cm) x 2.5 (cm) = 8 (cm) Tissue and other material debrided: Viable, Non-Viable, Slough, Subcutaneous, Slough Level: Skin/Subcutaneous Tissue Debridement Description: Excisional Instrument: Curette Bleeding: Minimum Hemostasis Achieved: Pressure End Time: 11:10 Procedural Pain:  4 Post Procedural Pain: 3 Response to Treatment: Procedure was tolerated well Level of Consciousness (Post- Awake and Alert procedure): Post Debridement Measurements of Total Wound Length: (cm) 3.2 Width: (cm) 2.5 Depth: (cm) 0.1 Volume: (cm) 0.628 Character of Wound/Ulcer Post Debridement: Improved Severity of Tissue Post Debridement: Fat layer exposed Post Procedure Diagnosis Same as Pre-procedure Electronic Signature(s) Signed: 11/21/2020 4:01:47 PM By: [name] PA-C Signed: 11/21/2020 5:44:00 PM  By: Baruch Gouty RN, BSN Entered By: Baruch Gouty on 11/21/2020 11:08:30 -------------------------------------------------------------------------------- HPI Details Patient Name: Date of Service: [name], [name] 11/21/2020 10:00 A M Medical Record Number: [id number] Patient Account Number: [id number] Date of Birth/Sex: Treating RN: 08-04-1955 (65 y.o. [name] Primary Care Provider: [name] Other Clinician: Referring Provider: Treating Provider/Extender: [name] in Treatment: 24 History of Present Illness HPI Description: the remaining wound is over the left medial ankle. Similar wound over the right medial ankle healed largely with use of Apligraf. Most recently we have been using Hydrofera Blue over this wound with considerable improvement. The patient has been extensively worked up in the past for her venous insufficiency and she is not a candidate for antireflux surgery although I have none of the details available currently. 08/24/14; considerable improvement today. About 50% of this wound areas now epithelialized. The base of the wound appears to be healthier granulation.as opposed to last week when she had deteriorated a considerable improvement 08/17/14; unfortunately the wound has regressed somewhat. The areas of epithelialization from the superior aspect are not nearly as healthy as they were last week. The patient  thinks her Hydrofera Blue slipped. 09/07/14; unfortunately the area has markedly regressed in the 2 weeks since I've seen this. There is an odor surrounding erythema. The healthy granulation tissue that we had at the base of the wound now is a dusky color. The nurse reports green drainage 09/14/14; the area looks somewhat better than last week. There is less erythema and less drainage. The culture I did did not show any growth. Nevertheless I think it is better to continue the Cipro and doxycycline for a further week. The remaining wound area was debridement. 09/21/14. Wound did not require debridement last week. Still less erythema and less drainage. She can complete her antibiotics. The areas of epithelialization in the superior aspect of the wound do not look as healthy as they did some weeks ago 10/05/14 continued improvement in the condition of this wound. There is advancing epithelialization. Less aggressive debridement required 10/19/14 continued improvement in the condition and volume of this wound. Less aggressive debridement to the inferior part of this to remove surface slough and fibrinous eschar 11/02/14 no debridement is required. The surface granulation appears healthy although some of her islands of epithelialization seem to have regressed. No evidence of infection 11/16/14; lites surface debridement done of surface eschar. The wound does not look to be unhealthy. No evidence of infection. Unfortunately the patient has had podiatry issues in the right foot and for some reason has redeveloped small surface ulcerations in the medial right ankle. Her original presentation involved wounds in this area 11/23/14 no debridement. The area on the right ankle has enlarged. The left ankle wound appears stable in terms of the surface although there is periwound inflammation. There has been regression in the amount of new skin 11/30/14 no debridement. Both wound areas appear healthy. There was no evidence of  infection. The the new area on the right medial ankle has enlarged although that both the surfaces appear to be stable. 12/07/14; Debridement of the right medial ankle wound. No no debridement was done on the left. 12/14/14 no major change in and now bilateral medial ankle wounds. Both of these are very painful but the no overt evidence of infection. She has had previous venous ablation 12/21/14; patient states that her right medial ankle wound is considerably more painful last week than usual. Her left  is also somewhat painful. She could not tolerate debridement. The right medial ankle wound has fibrinous surface eschar 12/28/14 this is a patient with severe bilateral venous insufficiency ulcers. For a considerable period of time we actually had the one on the right medial ankle healed however this recently opened up again in June. The left medial ankle wound has been a refractory area with some absent flows. We had some success with Hydrofera Blue on this area and it literally closed by 50% however it is recently opened up Foley. Both of these were debridement today of surface eschar. She tolerates this poorly 01/25/15: No change in the status of this. Thick adherent escar. Very poor tolerance of any attempt at debridement. I had healed the right medial malleolus wound for a considerable amount of time and had the left one down to about 50% of the volume although this is totally regressed over the last 48 weeks. Further the right leg has reopened. she is trying to make a appointment with pain and vascular, previous ablations with Dr. Aleda Grana. I do not believe there is an arterial insufficiency issue here 02/01/15 the status of the adherent eschar bilaterally is actually improved. No debridement was done. She did not manage to get vascular studies done 02/08/15 continued debridement of the area was done today. The slough is less adherent and comes off with less pressure. There is no surrounding  infection peripheral pulses are intact 02/15/15 selective debridement with a disposable curette. Again the slough is less adherent and comes off with less difficulty. No surrounding infection peripheral pulses are intact. 02/22/15 selective debridement of the right medial ankle wound. Slough comes off with less difficulty. No obvious surrounding infection peripheral pulses are intact I did not debridement the one on the left. Both of these are stable to improved 03/01/15 selective debridement of both wound areas using a curette to. Adherent slough cup soft with less difficulty. No obvious surrounding infection. The patient tells me that 2 days ago she noted a rash above the right leg wrap. She did not have this on her lower legs when she change this over she arrives with widespread left greater than right almost folliculitis-looking rash which is extremely pruritic. I don't see anything to culture here. There is no rash on the rest of her body. She feels well systemically. 03/08/15; selective debridement of both wounds using a curette. Base of this does not look unhealthy. She had limegreen drainage coming out of the left leg wound and describes a lot of drainage. The rash on her left leg looks improved to. No cultures were done. 03/22/15; patient was not here last week. Basal wounds does not look healthy and there is no surrounding erythema. No drainage. There is still a rash on the left leg that almost looks vasculitic however it is clearly limited to the top of where the wrap would be. 04/05/15; on the right required a surgical debridement of surface eschar and necrotic subcutaneous tissue. I did not debridement the area on the left. These continue to be large open wounds that are not changing that much. We were successful at one point in healing the area on the right, and at the same time the area on the left was roughly half the size of current measurements. I think a lot of the deterioration has to  do with the prolonged time the patient is on her feet at work 04/19/15 I attempted-like surface debridement bilaterally she does not tolerate this. She tells me that  she was in allergic care yesterday with extreme pain over her left lateral malleolus/ankle and was told that she has an "sprain" 05/03/15; large bilateral venous insufficiency wounds over the medial malleolus/medial aspect of her ankles. She complains of copious amounts of drainage and his usual large amounts of pain. There is some increasing erythema around the wound on the right extending into the medial aspect of her foot to. historically she came in with these wounds the right one healed and the left one came down to roughly half its current size however the right one is reopened and the left is expanded. 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 05/10/15 large bilateral venous insufficiency wounds. There is less adherence surface left however the surface culture that I did last week grew pseudomonas therefore bilateral selective debridement score necessary. There is surrounding erythema. The patient describes severe bilateral drainage and a lot of pain in the left ankle. Apparently her podiatrist was were ready to do a cortisone shot 05/17/15; the patient complains of pain and again copious amounts of drainage. 05/24/15; we used Iodo flex last week. Patient notes considerable improvement in wound drainage. Only needed to change this once. 05/31/15; we continued Iodoflex; the base of these large wounds bilaterally is not too bad but there is probably likely a significant bioburden here. I would like to debridement just doesn't tolerate it. 06/06/14 I would like to continue the Iodoflex although she still hasn't managed to obtain supplies. She has bilateral medial malleoli or large wounds which are mostly superficial. Both of them are covered circumferentially with some nonviable  fibrinous slough although she tolerates debridement very poorly. She apparently has an appointment for an ablation on the right leg by interventional radiology. 06/14/15; the patient arrives with the wounds and static condition. We attempted a debridement although she does not do well with this secondary to pain. I 07/05/15; wounds are not much smaller however there appears to be a cleaner granulating base. The left has tight fibrinous slough greater than the right. Debridement is tolerated poorly due to pain. Iodoflex is done more for these wounds in any of the multitude of different dressings I have tried on the left 1 and then subsequently the right. 07/12/15; no change in the condition of this wound. I am able to do an aggressive debridement on the right but not the left. She simply cannot tolerate it. We have been using Iodoflex which helps somewhat. It is worthwhile remembering that at one point we healed the right medial ankle wound and the left was about 25% of the current circumference. We have suggested returning to vascular surgery for review of possible further ablations for one reason or another she has not been able to do this. 07/26/15 no major change in the condition of either wound on her medial ankle. I did not attempt to debridement of these. She has been aggressively scrubbing these while she is in the shower at home. She has her supply of Iodoflex which seems to have done more for these wounds then anything I have put on recently. 08/09/15 wound area appears larger although not verified by measurements. Using Iodoflex 09/05/2015 -- she was here for avisit today but had significant problems with the wound and I was asked to see her for a physician opinion. I have summarize that this lady has had surgery on her left lower extremity about 10 years ago where the possible  veins stripping was done. She has had an opinion from interventional radiology around November 2016 where no further  sclerotherapy was ordered. The patient works 12 hours a day and stands on a concrete floor with work boots and is unable to get the proper compression she requires and cannot elevate her limbs appropriately at any given time. She has recently grown Pseudomonas from her wound culture but has not started her ciprofloxacin which was called in for her. 09/13/15 this continues to be a difficult situation for this patient. At one point I had this wound down to a 1.5 x 1.5" wound on her left leg. This is deteriorated and the right leg has reopened. She now has substantial wounds on her medial calcaneus, malleoli and into her lower leg. One on the left has surface eschar but these are far too painful for me to debridement here. She has a vascular surgery appointment next week to see if anything can be done to help here. I think she has had previous ablations several years ago at [facility]. She has no major edema. She tells me that she did not get product last time [facility] and went for several days without it. She continues to work in work boots 12 hours a day. She cannot get compression/4-layer under her work boots. 09/20/15 no major change. Periwound edema control was not very good. Her point with pain and vascular is next Wednesday the 25th 09/28/15; the patient is seen vascular surgery and is apparently scheduled for repeat duplex ultrasounds of her bilateral lower legs next week. 10/05/15; the patient was seen by Dr. Doren Custard of vascular surgery. He feels that she should have arterial insufficiency excluded as cause/contributed to her nonhealing stage she is therefore booked for an arteriogram. She has apparently monophasic signals in the dorsalis pedis pulses. She also of course has known severe chronic venous insufficiency with previous procedures as noted previously. I had another long discussion with the patient today about her continuing to work 12 hour shifts. I've written her out for 2 months area  had concerns about this as her work location is currently undergoing significant turmoil and this may lead to her termination. She is aware of this however I agree with her that she simply cannot continue to stand for 12 hours multiple days a week with the substantial wound areas she has. 10/19/15; the Dr. Doren Custard appointment was largely for an arteriogram which was normal. She does not have an arterial issue. He didn't make a comment about her chronic venous insufficiency for which she has had previous ablations. Presumably it was not felt that anything additional could be done. The patient is now out of work as I prescribed 2 weeks ago. Her wounds look somewhat less aggravated presumably because of this. I felt I would give debridement another try today 10/25/15; no major change in this patient's wounds. We are struggling to get her product that she can afford into her own home through her insurance. 11/01/15; no major change in the patient's wounds. I have been using silver alginate as the most affordable product. I spoke to Dr. Marla Roe last week with her requested take her to the OR for surgical debridement and placement of ACEL. Dr. Marla Roe told me that she would be willing to do this however [facility] will not cover this, fortunately the patient has [place] healthcare of some variant 11/08/15; no major change in the patient's wounds. She has been completely nonviable surface that this but is  in too much pain with any attempted debridement are clinic. I have arranged for her to see Dr. Marla Roe ham of plastic surgery and this appointment is on Monday. I am hopeful that they will take her to the OR for debridement, possible ACEL ultimately possible skin graft 11/22/15 no major change in the patient's wounds over her bilateral medial calcaneus medial malleolus into the lower legs. Surface on these does not look too bad however on the left there is surrounding erythema and tenderness.  This may be cellulitis or could him sleepy tinea. 11/29/15; no major changes in the patient's wounds over her bilateral medial malleolus. There is no infection here and I don't think any additional antibiotics are necessary. There is now plan to move forward. She sees Dr. Marla Roe in a week's time for preparation for operative debridement and ACEL placement I believe on 7/12. She then has a follow-up appointment with Dr. Marla Roe on 7/21 12/28/15; the patient returns today having been taken to the [place] by Dr. Marla Roe 12/12/15 she underwent debridement, intraoperative cultures [which were negative]. She had placement of a wound VAC. Parent really ACEL was not available to be placed. The wound VAC foam apparently adhered to the wound since then she's been using silver alginate, Xeroform under Ace wraps. She still says there is a lot of drainage and a lot of pain 01/31/16; this is a patient I see monthly. I had referred her to Dr. Marla Roe him of plastic surgery for large wounds on her bilateral medial ankles. She has been to the OR twice once in early July and once in early August. She tells me over the last 3 weeks she has been using the wound VAC with ACEL underneath it. On the right we've simply been using silver alginate. Under Kerlix Coban wraps. 02/28/16; this is a patient I'm currently seeing monthly. She is gone on to have a skin graft over her large venous insufficiency ulcer on the left medial ankle. This was done by Dr. Marla Roe him. The patient is a bit perturbed about why she didn't have one on her right medial ankle wound. She has been using silver alginate to this. 03/06/16; I received a phone call from her plastic surgery Dr. Marla Roe. She expressed some concern about the viability of the skin graft she did on the left medial ankle wound. Asked me to place Endoform on this. She told me she is not planning to do a subsequent skin graft on the right as the left one did not take very  well. I had placed Hydrofera Blue on the right 03/13/16; continue to have a reasonably healthy wound on the right medial ankle. Down to 3 mm in terms of size. There is epithelialization here. The area on the left medial ankle is her skin graft site. I suppose the last week this looks somewhat better. She has an open area inferiorly however in the center there appears to be some viable tissue. There is a lot of surface callus and eschar that will eventually need to come off however none of this looked to be infected. Patient states that the is able to keep the dressing on for several days which is an improvement. 03/20/16 no major change in the circumference of either wound however on the left side the patient was at Dr. Eusebio Friendly office and they did a debridement of left wound. 50% of the wound seems to be epithelialized. I been using Endoform on the left Hydrofera Blue in the right 03/27/16; she arrives  today with her wound is not looking as healthy as they did last week. The area on the right clearly has an adherent surface to this a very similar surface on the left. Unfortunately for this patient this is all too familiar problem. Clearly the Endoform is not working and will need to change that today that has some potential to help this surface. She does not tolerate debridement in this clinic very well. She is changing the dressing wants 04/03/16; patient arrives with the wounds looking somewhat better especially on the right. Dr. Migdalia Dk change the dressing to silver alginate when she saw her on Monday and also sold her some compression socks. The usefulness of the latter is really not clear and woman with severely draining wounds. 04/10/16; the patient is doing a bit of an experiment wearing the compression stockings that Dr. Migdalia Dk provided her to her left leg and the out of legs based dressings that we provided to the right. 05/01/16; the patient is continuing to wear compression stockings Dr.  Migdalia Dk provided her on the left that are apparently silver impregnated. She has been using Iodoflex to the right leg wound. Still a moderate amount of drainage, when she leaves here the wraps only last for 4 days. She has to change the stocking on the left leg every night 05/15/16; she is now using compression stockings bilaterally provided by Dr. Marla Roe. She is wearing a nonadherent layer over the wounds so really I don't think there is anything specific being done to this now. She has some reduction on the left wound. The right is stable. I think all healing here is being done without a specific dressing 06/09/16; patient arrives here today with not much change in the wound certainly in diameter to large circular wounds over the medial aspect of her ankle bilaterally. Under the light of these services are certainly not viable for healing. There is no evidence of surrounding infection. She is wearing compression stockings with some sort of silver impregnation as prescribed by Dr. Marla Roe. She has a follow-up with her tomorrow. 06/30/16; no major change in the size or condition of her wounds. These are still probably covered with a nonviable surface. She is using only her purchase stockings. She did see Dr. Marla Roe who seemed to want to apply Dakin's solution to this I'm not extreme short what value this would be. I would suggest Iodoflex which she still has at home. 07/28/16; I follow Mrs. Auchter episodically along with Dr. Marla Roe. 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 She has been applying a topical collagen ointment to both wounds with Adaptic. I don't think Dr. Marla Roe is planning to take her back to the OR. 08/19/16; I follow Mrs. Jeneen Rinks on a monthly basis along with Dr. Marla Roe of plastic surgery. 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 I been  following her for a number of years. At one point I was able to get the right medial malleolus wound to heal and had the left medial malleolus down to about half its current size however and I had to send her to plastic surgery for an operative debridement. Since then things have been stable to slightly improve the area on the right is slightly better one in the left about the same although there is much less adherent surface than I'm used to with this patient. She is using some form of liquid collagen  gel that Dr. Marla Roe provided a Kerlix cover with the patient's own pressure stockings. She tells me that she has extreme pain in both ankles and along the lateral aspect of both feet. She has been unable to work for some period of time. She is telling me she is retiring at the beginning of April. She sees Dr. Doran Durand of orthopedics next week 09/22/16; patient has not seen Dr. Marla Roe since the last time she is here. I'm not really sure what she is using to the wounds other than bits and pieces of think she had left over including most recently Hydrofera Blue. She is using juxtalite stockings. She is having difficulty with her husband's recent illness "stroke". She is having to transport him to various doctors appointments. Dr. Marla Roe left her the option of a repeat debridement with ACEL however she has not been able to get the time to follow-up on this. She continues to have a fair amount of drainage out of these wounds with certainly precludes leaving dressings on all week 10/13/16; patient has not seen Dr. Marla Roe since she was last in our clinic. I'm not really sure what she is doing with the wounds, we did try to get her [facility] and I think she is actually using this most of the time. Because of drainage she states she has to change this every second day although this is an improvement from what she used to do. She went to see Dr. Doran Durand who did not think she had a muscular issue  with regards to her feet, he referred her to a neurologist and I think the appointment is sometime in June. I changed her back to Iodoflex which she has used in the past but not recently. 11/03/16; the patient has been using Iodoflex although she ran out of this. Still claims that there is a lot of drainage although the wound does not look like this. No surrounding erythema. She has not been back to see Dr. Marla Roe 11/24/16; the patient has been using Iodoflex again but she ran out of it 2 or 3 days ago. There is no major change in the condition of either one of these wounds in fact they are larger and covered in a thick adherent surface slough/nonviable tissue especially on the left. She does not tolerate mechanical debridement in our clinic. Going back to see Dr. Marla Roe of plastic surgery for an operative debridement would seem reasonable. 12/15/16; the patient has not been back to see Dr. Marla Roe. She is been dealing with a series of illnesses and her husband which of monopolized her time. She is been using Sorbact which we largely supplied. She states the drainage is bad enough that it maximum she can go 2-3 days without changing the dressing 01/12/2017 -- the patient has not been back for about 4 weeks and has not seen Dr. Marla Roe not does she have any appointment pending. 01/23/17; patient has not seen Dr. Marla Roe even though I suggested this previously. She is using Santyl that was suggested last week by Dr. Con Memos this Cost her $16 through her insurance which is indeed surprising 02/12/17; continuing Santyl and the patient is changing this daily. A lot of drainage. She has not been back to see plastic surgery she is using an Ace wrap. Our intake nurse suggested wrap around stockings which would make a good reasonable alternative 02/26/17; patient is been using Santyl and changing this daily due to drainage. She has not been to see plastic surgery she uses in  April Ace wrap to control  the edema. She did obtain extremitease stockings but stated that the edema in her leg was to big for these 03/20/17; patient is using Santyl and Anasept. Surfaces looked better today the area on the right is actually measuring a little smaller. She has states she has a lot of pain in her feet and ankles and is asking for a consult to pain control which I'll try to help her with through our case manager. 04/10/17; the patient arrives with better-looking wound surfaces and is slightly smaller wound on the left she is using a combination of Santyl and Anasept. She has an appointment or at least as started in the pain control center associated with [facility] 05/14/17; this is a patient who I followed for a prolonged period of time. 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 At one point I had this down to a much smaller wound on the left however these reopened and we've never been able to get these to heal. She has been using Santyl and Anasept gel although 2 weeks ago she ran out of the Anasept gel. She has a stable appearance of the wound. She is going to the wound care clinic at [facility]. They wanted do a nerve block/spinal block although she tells me she is reluctant to go forward with that. 05/21/17; this is a patient I have followed for many years. 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 Chronic pain and deformity in her ankles as well. She is been to see plastic surgery as well as orthopedics. Using PolyMem AG most recently/Kerramax/ABDs and 2 layer compression. She has managed to keep this on and she is coming in for a nurse check to change the dressing on Tuesdays, we see her on Fridays 06/05/17; really quite a good looking surface and the area especially on the right medial has contracted in terms of dimensions. Well granulated healthy-looking tissue on both sides. Even with an open curet there is nothing that even feels abnormal  here. This is as good as I've seen this in quite some time. We have been using PolyMem AG and bringing her in for a nurse check 06/12/17; really quite good surface on both of these wounds. The right medial has contracted a bit left is not. We've been using PolyMem and AG and she is coming in for a nurse visit 06/19/17; we have been using PolyMem AG and bringing her in for a nurse check. Dimensions of her wounds are not better but the surfaces looked better bilaterally. She complained of bleeding last night and the left wound and increasing pain bilaterally. She states her wound pain is more neuropathic than just the wounds. There was some suggestion that this was radicular from her pain management doctor in talking to her it is really difficult to sort this out. 06/26/17; using PolyMem and AG and bringing her in for a nurse check as All of this and reasonably stable condition. Certainly not improved. The dimensions on the lateral part of the right leg look better but not really measuring better. The medial aspect on the left is about the same. 07/03/16; we have been using PolyMen AG and bringing her in for a nurse check to change the dressings as the wounds have drainage which precludes once weekly changing. We are using all secondary absorptive dressings.our intake nurse is brought up the idea of using a wound VAC/snap VAC on the wound to help with the  drainage to see if this would result in some contraction. This is not a bad idea. The area on the right medial is actually looking smaller. Both wounds have a reasonable-looking surface. There is no evidence of cellulitis. The edema is well controlled 07/10/17; the patient was denied for a snap VAC by her insurance. The major issue with these wounds continues to be drainage. We are using wicked PolyMem AG and she is coming in for a nurse visit to change this. The wounds are stable to slightly improved. The surface looks vibrant and the area on the  right certainly has shrunk in size but very slowly 07/17/17; the patient still has large wounds on her bilateral medial malleoli. Surface of both of these wounds looks better. The dimensions seem to come and go but no consistent improvement. There is no epithelialization. We do not have options for advanced treatment products due to insurance issues. They did not approve of the wound VAC to help control the drainage. More recently we've been using PolyMem and AG wicked to allow drainage through. We have been bringing her in for a nurse visit to change this. We do not have a lot of options for wound care products and the home again due to insurance issues 07/24/17; the patient's wound actually looks somewhat better today. No drainage measurements are smaller still healthy-looking surface. We used silver collagen under PolyMen started last week. We have been bringing her in for a dressing change 07/31/17; patient's wound surface continued to look better and I think there is visible change in the dimensions of the wound on the right. Rims of epithelialization. We have been using silver collagen under PolyMen and bringing her in for a dressing change. There appears to be less drainage although she is still in need of the dressing change 08/07/17. Patient's wound surface continues to look better on both sides and the area on the right is definitely smaller. We have been using silver collagen and PolyMen. She feels that the drainage has been it has been better. I asked her about her vascular status. She went to see Dr. Aleda Grana at [facility] and had some form of ablation. I don't have much detail on this. I haven't my notes from 2016 that she was not a candidate for any further ablation but I don't have any more information on this. We had referred her to vein and vascular I don't think she ever went. He does not have a history of PAD although I don't have any information on this either. We don't even have  ABIs in our record 08/14/17; we've been using silver collagen and PolyMen cover. And putting the patient and compression. She we are bringing her in as a nurse visit to change this because ofarge amount of drainage. We didn't the ABIs in clinic today since they had been done in many moons 1.2 bilaterally. She has been to see vein and vascular however this was at [facility] and she had ablation although I really don't have any information on this all seemed biking get a report. She is also been operatively debrided by plastic surgery and had a cell placed probably 8-12 months ago. This didn't have a major effect. We've been making some gains with current dressings 08/19/17-She is here in follow-up evaluation for bilateral medial malleoli ulcers. She continues to tolerate debridement very poorly. We will continue with recently changed topical treatment; if no significant improvement may consider switching to Iodosorb/Iodoflex. 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 She has been using silver collagen and PolyMem. I believe she has been used and tried on Iodoflex before. 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 These have been very refractory over time. We have been using silver collagen and PolyMen. She is coming in once a week for a doctor's and once a week for nurse visits. We are actually making some progress 09/18/17; the patient's wounds are smaller especially on the right medial. She arrives today to upset to consider even washing these off with Anasept which I think is been part of the reason this is been closing. We've been using collagen covered in PolyMen otherwise. It is noted that she has a small area of folliculitis on the right medial calf that.  As we are wrapping her legs I'll give her a short course of doxycycline to make sure this doesn't amount to anything. She is a long list of complaints today including imbalance, shortness of breath on exertion, inversion of her left ankle. With regards to the latter complaints she is been to see orthopedics and they offered her a tendon release surgery I believe but wanted her wounds to be closed first. I have recommended she go see her primary physician with regards to everything else. 09/25/17; patient's wounds are about the same size. We have made some progress bilaterally although not in recent weeks. She will not allow me T wash these wounds with Anasept even if she is doing her cell. Wheeze we've been using collagen covered in PolyMen. Last week she had a small area of folliculitis this is now opened into a small wound. She completed 5 days of trimethoprim sulfamethoxazole 10/02/17; unfortunately the area on her left medial ankle is worse with a larger wound area towards the Achilles. The patient complains of a lot of pain. She will not allow debridement although visually I don't think there is anything to debridement in any case. We have been using silver collagen and PolyMen for several months now. Initially we are making some progress although I'm not really seeing that today. We will move back to [facility]. His admittedly this is a bit of a repeat however I'm hoping that his situation is different now. The patient tells me she had her leg on the left give out on her yesterday this is process some pain. 10/09/17; the patient is seen twice a week largely because of drainage issues coming out of the chronic medial bimalleolar wounds that are chronic. Last week the dimensions of the one on the left looks a little larger I changed her to [facility]. She comes in today with a history of terrible pain in the bilateral wound areas. She will not allow debridement. She will not even allow a tissue  culture. There is no surrounding erythema no no evidence of cellulitis. We have been putting her Kerlix Coban man. She will not allow more aggressive compression as there was a suggestion to put her in 3 layer wraps. 10/16/17; large wounds on her bilateral medial malleoli. These are chronic. Not much change from last week. The surface looks have healthy but absolutely no epithelialization. A lot of pain little less so of drainage. She will not allow debridement or even washing these off in the vigorous fashion with Anasept. 10/23/17; large  wounds on her bilateral malleoli which are chronic. Some improvement in terms of size perhaps on the right since last time I saw these. She states that after we increased the 3 layer compression there was some bleeding, when she came in for a nurse visit she did not want 3 layer compression put back on about our nurse managed to convince her. She has known chronic venous visit issues and I'm hoping to get her to tolerate the 3 layer compression. using Hydrofera Blue 10/30/17; absolutely no change in the condition of either wound although we've had some improvement in dimensions on the right.. Attempted to put her in 3 layer compression she didn't tolerated she is back in 2 layer compression. We've been using Hydrofera Blue We looked over her past records. She had venous reflux studies in November 2016. There was no evidence of deep venous reflux on the right. Superficial vein did not show the greater saphenous vein at think this is been previously ablated the small saphenous vein was within normal limits. The left deep venous system showed no DVT the vessels were positive for deep venous reflux in the posterior tibial veins at the ankle. The greater saphenous vein was surgically absent small saphenous vein was within normal limits. She went to vein and vascular at [facility]. I believe she had an ablation on the left greater saphenous vein. I'll update her reflux  studies perhaps ever reviewed by vein and vascular. We've made absolutely no progress in these wounds. Will also try to read and TheraSkins through her insurance 11/06/17; W the patient apparently has a 2 week follow-up with vein and vascular I like him to review the whole issue with regards to her previous vascular workup by Dr. Aleda Grana. We've really made no progress on these wounds in many months. She arrives today with less viable looking surface on the left medial ankle wound. This was apparently looking about the same on Tuesday when she was here for nurse visit. 11/13/17; deep tissue culture I did last time of the left lower leg showed multiple organisms without any predominating. In particular no Staphylococcus or group A strep were isolated. We sent her for venous reflux studies. She's had a previous left greater saphenous vein stripping and I think sclerotherapy of the right greater saphenous vein. She didn't really look at the lesser saphenous vein this both wounds are on the medial aspect. She has reflux in the common femoral vein and popliteal vein and an accessory vein on the right and the common femoral vein and popliteal vein on the left. I'm going to have her go to see vein and vascular just the look over things and see if anything else beside aggressive compression is indicated here. We have not been able to make any progress on these wounds in spite of the fact that the surface of the wounds is never look too bad. 11/20/17; no major change in the condition of the wounds. Patient reports a large amount of drainage. She has a lot of complaints of pain although enlisting her today I wonder if some of this at least his neuropathic rather than secondary to her wounds. She has an appointment with vein and vascular on 12/30/17. The refractory nature of these wounds in my mind at least need vein and vascular to look over the wounds the recent reflux studies we did and her history to see if  anything further can be done here. I also note her gait is deteriorated quite a bit.  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 She has a bilateral Trendelenburg gait. I wonder if this is neuropathic or perhaps multilevel radicular. 11/27/17; her wounds actually looks slightly better. Healthy-looking granulation tissue a scant amount of epithelialization. [place] healthcare will not pay for [employer]. They will play for tri layer Oasis and Dermagraft. This is not a diabetic ulcer. We'll try for the tri layer Oasis. She still complains of some drainage. She has a vein and vascular appointment on 12/30/17 12/04/17; the wounds visually look quite good. Healthy-looking granulation with some degree of epithelialization. We are still waiting for response to our request for trial to try layer Oasis. Her appointment with vascular to review venous and arterial issues isn't sold the end of July 7/31. Not allow debridement or even vigorous cleansing of the wound surface. 12/18/17; slightly smaller especially on the right. 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 We've been using Hydrofera Blue. We still are looking into triple layer Oasis through her insurance 01/08/18 on evaluation today patient's wound actually appears to be showing signs of good improvement at this point in time. She has been tolerating the dressing changes without complication. Fortunately there does not appear to be any evidence of infection at this point in time. We have been utilizing silver nitrate which does seem to be of benefit for her which is also good news. Overall I'm very happy with how things seem to be both regards appearance as well as measurement. Patient did see Dr. Bridgett Larsson for evaluation on 12/30/17. In his assessment he felt that stripping would not likely add much more than chronic compression to the patient's healing process. His recommendation was to follow-up in three months with Dr. Doren Custard  if she hasn't healed in order to consider referral back to you and see vascular where she previously was in a trial and was able to get her wound to heal. I'll be see what she feels she when you staying compression and he reiterated this as well. 01/13/18 on evaluation today patient appears to actually be doing very well in regard to her bilateral medial malleolus ulcers. She seems to have tolerated the chemical cauterization with silver nitrate last week she did have some pain through that evening but fortunately states that I'll be see since it seems to be doing better she is overall pleased with the progress. 01/21/18; really quite a remarkable improvement since I've last seen these wounds. We started using silver nitrate specially on the islands of hyper granulation which for some reason her around the wound circumference. This is really done quite nicely. Primary dressing Hydrofera Blue under 4 layer compression. She seems to be able to hold out without a nurse rewrap. Follow-up in 1 week 01/28/18; we've continued the hydrofera blue but continued with chemical cauterization to the wound area that we started about a month ago for irregular hyper granulation. She is made almost stunning improvement in the overall wound dimensions. I was not really expecting this degree of improvement in these chronic wounds 02/05/18; we continue with Hydrofera Bluebut of also continued the aggressive chemical cauterization with silver nitrate. We made nice progress with the right greater than left wound. 02/12/18. We continued with Hydrofera Blue after aggressive chemical cauterization with silver nitrate. We appear to be making nice progress with both wound areas 02/19/2018; we continue with [facility] after washing the wounds vigorously with Anasept spray and chemical cauterization with silver nitrate. We are making excellent progress. The area on  the right's just about closed 02/26/2018. The area on the left  medial ankle had too much necrotic debris today. I used a #5 curette we are able to get most of the soft. I continued with the silver nitrate to the much smaller wound on the right medial ankle she had a new area on her right lower pretibial area which she says was due to a role in her compression 03/05/2018; both wound areas look healthy. Not much change in dimensions from last week. I continue to use silver nitrate and Hydrofera Blue. The patient saw Dr. Doren Custard of vein and vascular. He felt she had venous stasis ulcers. He felt based on her previous arteriogram she should have adequate circulation for healing. Also she has deep venous reflux but really no significant correctable superficial venous reflux at this time. He felt we should continue with conservative management including leg elevation and compression 04/02/2018; since we last saw this woman about a month ago she had a fall apparently suffered a pelvic fracture. I did not look up the x-ray. Nevertheless because of pain she literally was bedbound for 2 weeks and had home health coming out to change the dressing. Somewhat predictably this is resulted in considerable improvement in both wound areas. The right is just about closed on the medial malleolus and the left is about half the size. 04/16/2018; both her wounds continue to go down in size. Using Hydrofera Blue. 05/07/18; both her wounds appeared to be improving especially on the right where it is almost closed. We are using Hydrofera Blue 05/14/2018; slightly worse this week with larger wounds. Surface on the left medial not quite as good. We have been using Hydrofera Blue 05/21/18; again the wounds are slightly larger. Left medial malleolus slightly larger with eschar around the circumference. We have been using Hydrofera Blue undergoing a wraps for a prolonged period of time. This got a lot better when she was more recumbent due to a fall and a back injury. I change the primary dressing  the silver alginate today. She did not tolerate a 4 layer compression previously although I may need to bring this up with her next time 05/28/2018; area on the left medial malleolus again is slightly larger with more drainage. Area on the right is roughly unchanged. She has a small area of folliculitis on the right medial just on the lower calf. This does not look ominous. 06/03/2018 left medial malleolus slightly smaller in a better looking surface. We used silver nitrate on this last time with silver alginate. The area on the right appears slightly smaller 1/10; left medial malleolus slightly smaller. Small open area on the right. We used silver nitrate and silver alginate as of 2 weeks ago. We continue with the wound and compression. These got a lot better when she was off her feet 1/17; right medial malleolus wound is smaller. The left may be slightly smaller. Both surfaces look somewhat better. 1/24; both wounds are slightly smaller. Using silver alginate under Unna boots 1/31; both wounds appear smaller in fact the area on the right medial is just about closed. Surface eschar. We have been using silver alginate under Unna boots. The patient is less active now spends let much less time on her feet and I think this is contributed to the general improvement in the wound condition 2/7; both wounds appear smaller. I was hopeful the right medial would be closed however there there is still the same small open area. Slight amount of surface  eschar on the left the dimensions are smaller there is eschar but the wound edges appear to be free. We have been using silver alginate under Unna boot's 2/14; both wounds once again measure smaller. Circumferential eschar on the left medial. We have been using silver alginate under Unna boots with gradual improvement 2/21; the area on the right medial malleolus has healed. The area on the left is smaller. We have been using silver alginate and Unna boots. We can  discharge wrapping the right leg she has 20/30 stockings at home she will need to protect the scar tissue in this area 2/28; the area on the right medial malleolus remains closed the patient has a compression stocking. The area on the left is smaller. We have been using silver alginate and Unna boots. 3/6 the area on the right medial ankle remains closed. Good edema control noted she is using her own compression stocking. The area on the left medial ankle is smaller. We have been managing this with silver alginate and Unna boots which we will continue today. 3/13; the area on the right medial ankle remains closed and I'm declaring it healed today. When necessary the left is about the same still a healthy-looking surface but no major change and wound area. No evidence of infection and using silver alginate under unna and generally making considerable improvement 3/27 the area on the right medial ankle remains closed the area on the left is about the same as last week. Certainly not any worse we have been using silver alginate under an Unna boot 4/3; the area on the right medial ankle remains closed per the patient. We did not look at this wound. The wound on the left medial ankle is about the same surface looks healthy we have been using silver alginate under an Unna boot 4/10; area on the right medial ankle remains closed per the patient. We did not look at this wound. The wound on the left medial ankle is slightly larger. The patient complains that the [facility] caused burning pain all week. She also told us that she was a lot more active this week. Changed her back to silver alginate 4/17; right medial ankle still closed per the patient. Left medial ankle is slightly larger. Using silver alginate. She did not tolerate Hydrofera Blue on this area 4/24; right medial ankle remains closed we have not look at this. The left medial ankle continues to get larger today by about a centimeter. We have  been using silver alginate under Unna boots. She complains about 4 layer compression as an alternative. She has been up on her feet working on her garden 5/8; right medial ankle remains closed we did not look at this. The left medial ankle has increased in size about 100%. We have been using silver alginate under Unna boots. She noted increased pain this week and was not surprised that the wound is deteriorated 5/15; no major change in SA however much less erythema ( one week of doxy ocellulitis). 5/22-65 year old female returns at 1 week to the clinic for left medial ankle wound for which we have been using silver alginate under 3 layer compression She was placed on DOXY at last visit - the wound is wider at this visit. She is in 3 layer compression 5/29; change to [facility] last week. I had given her empiric doxycycline 2 weeks ago for a week. She is in 3 layer compression. She complains of a lot of pain and drainage on presentation  today. 6/5; using Hydrofera Blue. I gave her doxycycline recently empirically for erythema and pain around the wound. Believe her cultures showed enterococcus which not would not have been well covered by doxycycline nevertheless the wound looks better and I don't feel specifically that the enterococcus needs to be covered. She has a new what looks like a wrap injury on her lateral left ankle. 6/12; she is using Hydrofera Blue. She has a new area on the left anterior lower tibial area. This was a wrap injury last week. 6/19; the patient is using Hydrofera Blue. She arrived with marked inflammation and erythema around the wound and tenderness. 12/01/18 on evaluation today patient appears to be doing a little bit better based on what I'm hearing from the standpoint of lassos evaluation to this as far as the overall appearance of the wound is concerned. Then sometime substandard she typically sees Dr. Dellia Nims. Nonetheless overall very pleased with the progress that she's  made up to this point. No fevers, chills, nausea, or vomiting noted at this time. 7/10; some improvement in the surface area. Aggressively debrided last week apparently. I went ahead with the debridement today although the patient does not tolerate this very well. We have been using Iodoflex. Still a fair amount of drainage 7/17; slightly smaller. Using Iodoflex. 7/24; no change from last week in terms of surface area. We have been using Iodoflex. Surface looks and continues to look somewhat better 7/31; surface area slightly smaller better looking surface. We have been using Iodoflex. This is under Unna boot compression 8/7-Patient presents at 1 week with Unna boot and Iodoflex, wound appears better 8/14-Patient presents at 1 week with Iodoflex, we use the Unna boot, wound appears to be stable better.Patient is getting Botox treatment for the inversion of the foot for tendon release, Next week 8/21; we are using Iodoflex. Unna boot. The wound is stable in terms of surface area. Under illumination there is some areas of the wound that appear to be either epithelialized or perhaps this is adherent slough at this point I was not really clear. It did not wipe off and I was reluctant to debride this today. 8/28; we are using Iodoflex in an Unna boot. Seems to be making good improvement. 9/4; using Iodoflex and wound is slightly smaller. 9/18; we are using Iodoflex with topical silver nitrate when she is here. The wound continues to be smaller 10/2; patient missed her appointment last week due to GI issues. She left and Iodoflex based dressing on for 2 weeks. Wound is about the same size about the size of a dime on the left medial lower 10/9 we have been using Iodoflex on the medial left ankle wound. She has a new superficial probable wrap injury on the dorsal left ankle 10/16; we have been using Hydrofera Blue since last week. This is on the left medial ankle 10/23; we have been using Hydrofera Blue  since 2 weeks ago. This is on the left medial ankle. Dimensions are better 11/6; using Hydrofera Blue. I think the wound is smaller but still not closed. Left medial ankle 11/13; we have been using Hydrofera Blue. Wound is certainly no smaller this week. Also the surface not as good. This is the remanent of a very large area on her left medial ankle. 11/20; using Sorbact since last week. Wound was about the same in terms of size although I was disappointed about the surface debris 12/11; 3-week follow-up. Patient was on vacation. Wound is measuring slightly larger  we have been using Sorbact. 12/18; wound is about the same size however surface looks better last week after debridement. We have been using Sorbact under compression 1/15 wound is probably twice the size of last time increased in length nonviable surface. We have been using Sorbact. She was running a mild fever and missed her appointment last week 1/22; the wound is come down in size but under illumination still a very adherent debris we have been Hydrofera Blue that I changed her to last week 1/29; dimensions down slightly. We have been using Hydrofera Blue 2/19 dimensions are the same however there is rims of epithelialization under illumination. Therefore more the surface area may be epithelialized 2/26; the patient's wound actually measures smaller. The wound looks healthy. We have been using Hydrofera Blue. I had some thoughts about running Apligraf then I still may do that however this looks so much better this week we will delay that for now 3/5; the wound is small but about the same as last week. We have been using Hydrofera Blue. No debridement is required today. 3/19; the wound is about the size of a dime. Healthy looking wound even under illumination. We have been using Hydrofera Blue. No mechanical debridement is necessary 3/26; not much change from last week although still looks very healthy. We have been using Hydrofera Blue  under Unna boots Patient was offered an ankle fusion by podiatry but not until the wound heals with a proceed with this. 4/9; the patient comes in today with her original wound on the medial ankle looking satisfactory however she has some uncontrolled swelling in the middle part of her leg with 2 new open areas superiorly just lateral to the tibia. I think this was probably a wrap issue. She said she felt uncomfortable during the week but did not call in. We have been using Hydrofera Blue 4/16; the wound on the medial ankle is about the same. She has innumerable small areas superior to this across her mid tibia. I think this is probably folliculitis. She is also been working in the yard doing a lot of sweating 4/30; the patient issue on the upper areas across her mid tibia of all healed. I think this was excessive yard work if I remember. Her wound on the medial ankle is smaller. Some debris on this we have been using Hydrofera Blue under Unna boots 5/7; mid tibia. She has been using Hydrofera Blue under an Unna wrap. She is apparently going for her ankle surgery on June 3 10/28/19-Patient returns to clinic with the ankle wound, we are using Hydrofera Blue under Unna wrap, surgery is scheduled for her left foot for June 3 so she will be back for nurse visit next week READMISSION 01/17/2020 [name] is a 65 year old woman we have had in this clinic for a long period of time with severe venous hypertension and refractory wounds on her medial lower legs and ankles bilaterally. 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 When she was off her legs for a prolonged period example when she fell and suffered a compression fracture things would heal up quite nicely. She is now retired and we managed to heal up the right medial leg wound. The left one was very tiny last time I saw  this although still refractory. 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 It got to the point where this was interfering with her walking and she elected to proceed with a ankle arthrodesis to straighten her her ankle and leave her with a functional outcome for mobilization. The patient was referred to Dr. Doren Custard and really this took some time to arrange. Dr. Doren Custard saw her on 12/07/2019. Once again he verified that she had no arterial issues. She had previously had an angiogram several years ago. Follow-up ABIs on the left showed an ABI of 1.12 with triphasic waveforms and a TBI of 0.92. She is felt to have chronic deep venous insufficiency but I do not think it was felt that anything could be done from about this from an ablation point of view. At the time Dr. Doren Custard saw this patient the wounds actually look closed via the pictures in his clinic. The patient finally underwent her surgery on 12/15/2019. This went reasonably well and there was a good anatomic outcome. She developed a small distal wound dehiscence on the lateral part of the surgical wound. However more problematically she is developed recurrence of the wound on the medial left ankle. There are actually 2 wounds here one in the distal lower leg and 1 pretty much at the level of the medial malleolus. It is a more distal area that is more problematic. She has been using Hydrofera Blue which started on Friday before this she was simply Ace wrapping. There was a culture done that showed Pseudomonas and she is on ciprofloxacin. A recent CNS on 8/11 was negative. The patient reports some pain but I generally think this is improving. She is using a cam boot completely nonweightbearing using a walker for pivot transfers and a wheelchair 8/24; not much improvement unfortunately she has a surgical wound on the lateral part in the venous insufficiency wound  medially. The bottom part of the medial insufficiency wound is still necrotic there is exposed tendon here. We have been using Hydrofera Blue under compression. Her edema control is however better 8/31; patient in for follow-up of his surgical wound on the lateral part of her left leg and chronic venous insufficiency ulcers medially. We put her back in compression last week. She comes in today with a complaint of 3 or 4 days worth of increasing pain. She felt her cam walker was rubbing on the area on the back of her heel. However there is intense erythema seems more likely she has cellulitis. She had 2 cultures done when she was seeing podiatry in the postop. One of them in late July showed Pseudomonas and she received a course of ciprofloxacin the other was negative on 8/11 she is allergic to penicillin with anaphylactoid complaints of hives oral swelling via information in epic 9/9; when I saw this patient last week she had intense anterior erythema around her wound on the right lateral heel and ankle and also into the right medial heel. Some of this was no doubt drainage and her walker boot however I was convinced she had cellulitis. I gave her Levaquin and Bactrim she is finishing up on this now. She is following up with Dr. [name] he saw her yesterday. He is taken her out of the walking boot of course she is still nonweightbearing. Her x-ray was negative for any worrisome features such as soft tissue air etc. Things are a lot better this week. She has home health. We have been using Hydrofera Blue under an Haematologist which she  put back on yesterday. I did not wrap her last week 9/17; her surrounding skin looks a lot better. In fact the area on the left lateral ankle has just a scant amount of eschar. The only remaining wound is the large area on the left medial ankle. Probably about 60% of this is healthy granulation at the surface however she has a significant divot distally. This has  adherent debris in it. I been using debridement and silver collagen to try and get this area to fill-in although I do not think we have made much progress this week 9/24; the patient's wound on the left medial ankle looks a lot better. The deeper divot area distally still requires debridement but this is cleaning up quite nicely we have been using silver collagen. The patient is complaining of swelling in her foot and is worried that that is contributing to the nonhealing of the ankle wound. She is also complaining of numbness in her anterior toes 10/4; left medial ankle. The small area distally still has a divot with necrotic material that I have been debriding away. This has an undermining area. She is approved for Apligraf. She saw Dr. [name] her surgeon on 10/1. I think he declared himself is satisfied with the condition of things. Still nonweightbearing till the end of the month. 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 Her surgical wound is well closed. There is no evidence of infection 10/11; the patient arrived in clinic today with the expectation that we be able to put an Apligraf on this area after debridement however she arrives with a relatively large amount of green drainage on the dressing. The patient states that this started on Friday. She has not been systemically unwell. 10/19; culture I did last week showed both Enterococcus and Pseudomonas. I think this came in separate parts because I stopped her ciprofloxacin I gave her and prescribed her linezolid however now looking at the final culture result this is Pseudomonas which is resistant to quinolones. She has not yet picked up the linezolid apparently phone issues. We are also trying to get a topical antibiotic out of [facility] in [place] they can be applied by home health. She is still having green drainage 10/16; the patient has her topical antibiotic from [facility] in [place]. This  is a compounded gel with vancomycin and ciprofloxacin and gentamicin. We are applying this on the wound bed with silver alginate over the top with Unna boot wraps. She arrives in clinic today with a lot less ominous looking drainage although she is only use this topical preparation once the second time today. She sees Dr. [name] her surgeon on Friday she has home health changing the dressing 11/2; still using her compounded topical antibiotic under silver alginate. Surface is cleaning up there is less drainage. We had an Apligraf for her today and I elected to apply it. A light coating of her antibiotic 04/25/2020 upon evaluation today patient appears to be doing well with regard to her ankle ulcer. There is a little bit of slough noted on the surface of the wound I am can have to perform sharp debridement to clear this away today. With that being said other than that fact overall I feel like she is making progress and we do see some new epithelial growth. There is also some improvement in the depth of the wound and that distal portion. There is little bit of slough there as well. 12/7; 2-week follow-up. Apligraf #  3. Dimensions are smaller. Closing in especially inferiorly. Still some surface debris. Still using the [facility] topical antibiotic but I told her that I don't think this needs to be renewed 12/21; 2-week follow-up. Apligraf #4 dimensions are smaller. Nice improvement 06/05/2020; 2-week follow-up. The patient's wound on the left medial ankle looks really excellent. Nice granulation. Advancing epithelialization no undermining no evidence of infection. We would have to reapply for another Apligraf but with the condition of this wound I did not feel strongly about it. We used Hydrofera Blue under the same degree of compression. She follows up with Dr. [name] her surgeon a week Friday 06/13/2020 upon evaluation today patient appears to be doing excellent in regard to her wound. She has been  tolerating the dressing changes without complication. Fortunately there is no signs of active infection at this time. No fevers, chills, nausea, vomiting, or diarrhea. She was using Hydrofera Blue last week. 06/20/2020 06/20/2020 on evaluation today patient actually appears to be doing excellent in regard to her wound. This is measuring better and looking much better as well. She has been using the collagen that seems to be doing better for her as well even though the [facility] was and is not sticking or feeling as rough on her wound. She did see Dr. [name] on Friday he is very pleased he also stated none of the hardware has shifted. That is great news 1/27; the patient has a small clean wound all that is remaining. I agree that this is too small to really consider an Apligraf. Under illumination the surface is looking quite good. We have been using collagen although the dimensions are not any better this week 2/2; the patient has a small clean wound on the left medial ankle. Although this left of her substantial original areas. Measurements are smaller. We have been using polymen Ag under an Haematologist. 2/10; small area on the left medial ankle. This looks clean nothing to debride however dimensions are about the same we have been using polymen I think now for 2 weeks 2/17; not much change in surface area. We have been using polymen Ag without any improvement. 3/17; 1 month follow-up. The patient has been using endoform without any improvement in fact I think this looks worse with more depth and more expansion 3/24; no improvement. Perhaps less debris on the surface. We have been using Sorbact for 1 week 4/4; wound measures larger. She has edema in her leg and her foot which she tells as her wrap came down. We have been using Unna boots. Sorbact of the wound. She has been approved for Apligraf 09/12/2020 upon evaluation today patient appears to be doing well with regard to her wound. We did get the  Apligraf reapproved which is great news we have that available for application today. Fortunately there is no signs of infection and overall the patient seems to be doing great. The wound bed is nice and clean. 4/27; patient presents for her second application of Apligraf. She states over the past week she has been on her feet more often due to being outside in her garden. She has noted more swelling to her foot as a result. She denies increased warmth, pain or erythema to the wound site. 10/10/2020 upon evaluation today patient appears to be doing well with regard to her wound which does not appear to be quite as irritated as last week from what I am hearing. With that being said unfortunately she is having issues with some  erythema and warmth to touch as well as an increase size. I do believe this likely is infected. 10/17/2020 upon evaluation today patient appears to be doing excellent in regard to her wound this is significantly improved as compared to last week. Fortunately I think that the infection is much better controlled at this point. She did have evidence of both Enterococcus as well as Staphylococcus noted on culture. Enterococcus really would not be helped significantly by the Cipro but the wound is doing so much better I am under the assumption that the Staphylococcus is probably the main organism that is causing the current infection. Nonetheless I think that she is doing excellent as far as that is concerned and I am very pleased in that regard. I would therefore recommend she continue with the Cipro. 10/31/2020 upon evaluation today patient appears to be doing well with regard to her wound. She has been tolerating the dressing changes without complication. Fortunately there is no signs of active infection and overall I am extremely pleased with where things stand today. No fevers, chills, nausea, vomiting, or diarrhea. With that being said she does have some green drainage coming from the  wound and although it looks okay I am a little concerned about the possibility of a continuing infection. Specifically with Pseudomonas. For that reason I will go ahead and send in a prescription for Cipro for her to be continued. 11/14/2020 upon evaluation today patient appears to be doing very well currently in regard to her wound on her leg. She has been tolerating the dressing changes without complication. Fortunately I feel like the infection is finally under good control here. Unfortunately we do not have the Apligraf for application today although we can definitely order to have it in place for next week. That will be her fifth and final of the current series. Nonetheless I feel like her wound is really doing quite well which is great news. 11/21/2020 upon evaluation today patient appears to be doing well with regard to her wound on the medial ankle. Fortunately I think the infection is under control and I do believe we can go ahead and reapply the Apligraf today. She is in agreement with that plan. There does not appear to be any signs of active infection at this time which is great news. No fevers, chills, nausea, vomiting, or diarrhea. Electronic Signature(s) Signed: 11/21/2020 11:21:12 AM By: [name] PA-C Entered By: [name] on 11/21/2020 11:21:12 -------------------------------------------------------------------------------- Physical Exam Details Patient Name: Date of Service: [facility] [name], [name] 11/21/2020 10:00 A M Medical Record Number: [id number] Patient Account Number: [id number] Date of Birth/Sex: Treating RN: 10-Nov-1955 (65 y.o. [name] Primary Care Provider: [name] Other Clinician: Referring Provider: Treating Provider/Extender: [name] in Treatment: 64 Constitutional Well-nourished and well-hydrated in no acute distress. Respiratory normal breathing without difficulty. Psychiatric this patient is able to make  decisions and demonstrates good insight into disease process. Alert and Oriented x 3. pleasant and cooperative. Notes Upon inspection patient's wound bed showed signs of good granulation epithelization at this point. There does not appear to be any evidence of active infection which is great and overall very pleased with where things stand today. Electronic Signature(s) Signed: 11/21/2020 11:21:28 AM By: [name] PA-C Entered By: [name] on 11/21/2020 11:21:28 -------------------------------------------------------------------------------- Physician Orders Details Patient Name: Date of Service: [name], [name] 11/21/2020 10:00 A M Medical Record Number: [id number] Patient Account Number:  [id number] Date of Birth/Sex: Treating RN: Aug 13, 1955 (65 y.o. [name] Primary Care Provider: [name] Other Clinician: Referring Provider: Treating Provider/Extender: [name] in Treatment: 15 Verbal / Phone Orders: No Diagnosis Coding ICD-10 Coding Code Description [phone] Chronic venous hypertension (idiopathic) with ulcer and inflammation of left lower extremity L97.828 Non-pressure chronic ulcer of other part of left lower leg with other specified severity L97.328 Non-pressure chronic ulcer of left ankle with other specified severity Follow-up Appointments ppointment in 2 weeks. - with Margarita Grizzle Return A Nurse Visit: - 1 week post apligraf Cellular or Tissue Based Products Cellular or Tissue Based Product Type: - Apligrag #5 Cellular or Tissue Based Product applied to wound bed, secured with steri-strips, cover with Adaptic or Mepitel. (DO NOT REMOVE). Bathing/ Shower/ Hygiene May shower with protection but do not get wound dressing(s) wet. Edema Control - Lymphedema / SCD / Other Elevate legs to the level of the heart or above for 30 minutes daily and/or when sitting, a frequency of: - throughout the day Avoid standing for long  periods of time. Exercise regularly Compression stocking or Garment 20-30 mm/Hg pressure to: - right leg daily Wound Treatment Wound #15 - Malleolus Wound Laterality: Left, Medial Cleanser: Soap and Water 1 x Per Week/30 Days Discharge Instructions: May shower and wash wound with dial antibacterial soap and water prior to dressing change. Peri-Wound Care: Triamcinolone 15 (g) 1 x Per Week/30 Days Discharge Instructions: Use triamcinolone 15 (g) mixed with lotion Peri-Wound Care: Sween Lotion (Moisturizing lotion) 1 x Per Week/30 Days Discharge Instructions: Apply moisturizing lotion as directed Secondary Dressing: Woven Gauze Sponge, Non-Sterile 4x4 in 1 x Per Week/30 Days Discharge Instructions: Apply over primary dressing as directed. Secondary Dressing: ABD Pad, 5x9 1 x Per Week/30 Days Discharge Instructions: Apply over primary dressing as directed. Compression Wrap: Unnaboot w/Calamine, 4x10 (in/yd) 1 x Per Week/30 Days Discharge Instructions: Apply Unnaboot as directed. Electronic Signature(s) Signed: 11/21/2020 4:01:47 PM By: [name] PA-C Signed: 11/21/2020 5:44:00 PM By: Baruch Gouty RN, BSN Entered By: Baruch Gouty on 11/21/2020 11:16:49 -------------------------------------------------------------------------------- Problem List Details Patient Name: Date of Service: [name] [name], [name] 11/21/2020 10:00 A M Medical Record Number: [id number] Patient Account Number: [id number] Date of Birth/Sex: Treating RN: [birth year] (65 y.o. [name] Primary Care Provider: [name] Other Clinician: Referring Provider: Treating Provider/Extender: [name] in Treatment: 31 Active Problems ICD-10 Encounter Code Description Active Date MDM Diagnosis I87.332 Chronic venous hypertension (idiopathic) with ulcer and inflammation of left 01/17/2020 No Yes lower extremity L97.828 Non-pressure chronic ulcer of other part of left lower leg  with other specified 01/17/2020 No Yes severity L97.328 Non-pressure chronic ulcer of left ankle with other specified severity 01/17/2020 No Yes Inactive Problems ICD-10 Code Description Active Date Inactive Date L03.116 Cellulitis of left lower limb 01/31/2020 01/31/2020 T81.31XD Disruption of external operation (surgical) wound, not elsewhere classified, subsequent 01/17/2020 01/17/2020 encounter Resolved Problems Electronic Signature(s) Signed: 11/21/2020 10:40:38 AM By: [name] PA-C Entered By: [name] on 11/21/2020 10:40:38 -------------------------------------------------------------------------------- Progress Note Details Patient Name: Date of Service: [name], [name] 11/21/2020 10:00 A M Medical Record Number: [id number] Patient Account Number: [id number] Date of Birth/Sex: Treating RN: 03-Jun-1955 (65 y.o. [name] Primary Care Provider: [name] Other Clinician: Referring Provider: Treating Provider/Extender: [name] in Treatment: 32 Subjective Chief Complaint Information obtained from Patient patient is been followed long-term in this clinic  for venous insufficiency ulcers with inflammation, hypertension and ulceration over the medial ankle bilaterally. 01/17/2020; this is a patient who is here for review of postoperative wounds on the left lateral ankle and recurrence of venous stasis ulceration on the left medial History of Present Illness (HPI) the remaining wound is over the left medial ankle. Similar wound over the right medial ankle healed largely with use of Apligraf. Most recently we have been using Hydrofera Blue over this wound with considerable improvement. The patient has been extensively worked up in the past for her venous insufficiency and she is not a candidate for antireflux surgery although I have none of the details available currently. 08/24/14; considerable improvement today. About 50% of this  wound areas now epithelialized. The base of the wound appears to be healthier granulation.as opposed to last week when she had deteriorated a considerable improvement 08/17/14; unfortunately the wound has regressed somewhat. The areas of epithelialization from the superior aspect are not nearly as healthy as they were last week. The patient thinks her Hydrofera Blue slipped. 09/07/14; unfortunately the area has markedly regressed in the 2 weeks since I've seen this. There is an odor surrounding erythema. The healthy granulation tissue that we had at the base of the wound now is a dusky color. The nurse reports green drainage 09/14/14; the area looks somewhat better than last week. There is less erythema and less drainage. The culture I did did not show any growth. Nevertheless I think it is better to continue the Cipro and doxycycline for a further week. The remaining wound area was debridement. 09/21/14. Wound did not require debridement last week. Still less erythema and less drainage. She can complete her antibiotics. The areas of epithelialization in the superior aspect of the wound do not look as healthy as they did some weeks ago 10/05/14 continued improvement in the condition of this wound. There is advancing epithelialization. Less aggressive debridement required 10/19/14 continued improvement in the condition and volume of this wound. Less aggressive debridement to the inferior part of this to remove surface slough and fibrinous eschar 11/02/14 no debridement is required. The surface granulation appears healthy although some of her islands of epithelialization seem to have regressed. No evidence of infection 11/16/14; lites surface debridement done of surface eschar. The wound does not look to be unhealthy. No evidence of infection. Unfortunately the patient has had podiatry issues in the right foot and for some reason has redeveloped small surface ulcerations in the medial right ankle. Her original  presentation involved wounds in this area 11/23/14 no debridement. The area on the right ankle has enlarged. The left ankle wound appears stable in terms of the surface although there is periwound inflammation. There has been regression in the amount of new skin 11/30/14 no debridement. Both wound areas appear healthy. There was no evidence of infection. The the new area on the right medial ankle has enlarged although that both the surfaces appear to be stable. 12/07/14; Debridement of the right medial ankle wound. No no debridement was done on the left. 12/14/14 no major change in and now bilateral medial ankle wounds. Both of these are very painful but the no overt evidence of infection. She has had previous venous ablation 12/21/14; patient states that her right medial ankle wound is considerably more painful last week than usual. Her left is also somewhat painful. She could not tolerate debridement. The right medial ankle wound has fibrinous surface eschar 12/28/14 this is a patient with severe bilateral venous insufficiency  ulcers. For a considerable period of time we actually had the one on the right medial ankle healed however this recently opened up again in June. The left medial ankle wound has been a refractory area with some absent flows. We had some success with Hydrofera Blue on this area and it literally closed by 50% however it is recently opened up Foley. Both of these were debridement today of surface eschar. She tolerates this poorly 01/25/15: No change in the status of this. Thick adherent escar. Very poor tolerance of any attempt at debridement. I had healed the right medial malleolus wound for a considerable amount of time and had the left one down to about 50% of the volume although this is totally regressed over the last 48 weeks. Further the right leg has reopened. she is trying to make a appointment with pain and vascular, previous ablations with Dr. Aleda Grana. I do not believe  there is an arterial insufficiency issue here 02/01/15 the status of the adherent eschar bilaterally is actually improved. No debridement was done. She did not manage to get vascular studies done 02/08/15 continued debridement of the area was done today. The slough is less adherent and comes off with less pressure. There is no surrounding infection peripheral pulses are intact 02/15/15 selective debridement with a disposable curette. Again the slough is less adherent and comes off with less difficulty. No surrounding infection peripheral pulses are intact. 02/22/15 selective debridement of the right medial ankle wound. Slough comes off with less difficulty. No obvious surrounding infection peripheral pulses are intact I did not debridement the one on the left. Both of these are stable to improved 03/01/15 selective debridement of both wound areas using a curette to. Adherent slough cup soft with less difficulty. No obvious surrounding infection. The patient tells me that 2 days ago she noted a rash above the right leg wrap. She did not have this on her lower legs when she change this over she arrives with widespread left greater than right almost folliculitis-looking rash which is extremely pruritic. I don't see anything to culture here. There is no rash on the rest of her body. She feels well systemically. 03/08/15; selective debridement of both wounds using a curette. Base of this does not look unhealthy. She had limegreen drainage coming out of the left leg wound and describes a lot of drainage. The rash on her left leg looks improved to. No cultures were done. 03/22/15; patient was not here last week. Basal wounds does not look healthy and there is no surrounding erythema. No drainage. There is still a rash on the left leg that almost looks vasculitic however it is clearly limited to the top of where the wrap would be. 04/05/15; on the right required a surgical debridement of surface eschar and necrotic  subcutaneous tissue. I did not debridement the area on the left. These continue to be large open wounds that are not changing that much. We were successful at one point in healing the area on the right, and at the same time the area on the left was roughly half the size of current measurements. I think a lot of the deterioration has to do with the prolonged time the patient is on her feet at work 04/19/15 I attempted-like surface debridement bilaterally she does not tolerate this. She tells me that she was in allergic care yesterday with extreme pain over her left lateral malleolus/ankle and was told that she has an "sprain" 05/03/15; large bilateral venous insufficiency wounds  over the medial malleolus/medial aspect of her ankles. She complains of copious amounts of drainage and his usual large amounts of pain. There is some increasing erythema around the wound on the right extending into the medial aspect of her foot to. historically she came in with these wounds the right one healed and the left one came down to roughly half its current size however the right one is reopened and the left is expanded. 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 05/10/15 large bilateral venous insufficiency wounds. There is less adherence surface left however the surface culture that I did last week grew pseudomonas therefore bilateral selective debridement score necessary. There is surrounding erythema. The patient describes severe bilateral drainage and a lot of pain in the left ankle. Apparently her podiatrist was were ready to do a cortisone shot 05/17/15; the patient complains of pain and again copious amounts of drainage. 05/24/15; we used Iodo flex last week. Patient notes considerable improvement in wound drainage. Only needed to change this once. 05/31/15; we continued Iodoflex; the base of these large wounds bilaterally is not too bad but there is probably  likely a significant bioburden here. I would like to debridement just doesn't tolerate it. 06/06/14 I would like to continue the Iodoflex although she still hasn't managed to obtain supplies. She has bilateral medial malleoli or large wounds which are mostly superficial. Both of them are covered circumferentially with some nonviable fibrinous slough although she tolerates debridement very poorly. She apparently has an appointment for an ablation on the right leg by interventional radiology. 06/14/15; the patient arrives with the wounds and static condition. We attempted a debridement although she does not do well with this secondary to pain. I 07/05/15; wounds are not much smaller however there appears to be a cleaner granulating base. The left has tight fibrinous slough greater than the right. Debridement is tolerated poorly due to pain. Iodoflex is done more for these wounds in any of the multitude of different dressings I have tried on the left 1 and then subsequently the right. 07/12/15; no change in the condition of this wound. I am able to do an aggressive debridement on the right but not the left. She simply cannot tolerate it. We have been using Iodoflex which helps somewhat. It is worthwhile remembering that at one point we healed the right medial ankle wound and the left was about 25% of the current circumference. We have suggested returning to vascular surgery for review of possible further ablations for one reason or another she has not been able to do this. 07/26/15 no major change in the condition of either wound on her medial ankle. I did not attempt to debridement of these. She has been aggressively scrubbing these while she is in the shower at home. She has her supply of Iodoflex which seems to have done more for these wounds then anything I have put on recently. 08/09/15 wound area appears larger although not verified by measurements. Using Iodoflex 09/05/2015 -- she was here for avisit  today but had significant problems with the wound and I was asked to see her for a physician opinion. I have summarize that this lady has had surgery on her left lower extremity about 10 years ago where the possible veins stripping was done. She has had an opinion from interventional radiology around November 2016 where no further sclerotherapy was ordered. The patient works 12 hours a  day and stands on a concrete floor with work boots and is unable to get the proper compression she requires and cannot elevate her limbs appropriately at any given time. She has recently grown Pseudomonas from her wound culture but has not started her ciprofloxacin which was called in for her. 09/13/15 this continues to be a difficult situation for this patient. At one point I had this wound down to a 1.5 x 1.5" wound on her left leg. This is deteriorated and the right leg has reopened. She now has substantial wounds on her medial calcaneus, malleoli and into her lower leg. One on the left has surface eschar but these are far too painful for me to debridement here. She has a vascular surgery appointment next week to see if anything can be done to help here. I think she has had previous ablations several years ago at [facility]. She has no major edema. She tells me that she did not get product last time [facility] Ag] and went for several days without it. She continues to work in work boots 12 hours a day. She cannot get compression/4-layer under her work boots. 09/20/15 no major change. Periwound edema control was not very good. Her point with pain and vascular is next Wednesday the 25th 09/28/15; the patient is seen vascular surgery and is apparently scheduled for repeat duplex ultrasounds of her bilateral lower legs next week. 10/05/15; the patient was seen by Dr. Doren Custard of vascular surgery. He feels that she should have arterial insufficiency excluded as cause/contributed to her nonhealing stage she is therefore booked for  an arteriogram. She has apparently monophasic signals in the dorsalis pedis pulses. She also of course has known severe chronic venous insufficiency with previous procedures as noted previously. I had another long discussion with the patient today about her continuing to work 12 hour shifts. I've written her out for 2 months area had concerns about this as her work location is currently undergoing significant turmoil and this may lead to her termination. She is aware of this however I agree with her that she simply cannot continue to stand for 12 hours multiple days a week with the substantial wound areas she has. 10/19/15; the Dr. Doren Custard appointment was largely for an arteriogram which was normal. She does not have an arterial issue. He didn't make a comment about her chronic venous insufficiency for which she has had previous ablations. Presumably it was not felt that anything additional could be done. The patient is now out of work as I prescribed 2 weeks ago. Her wounds look somewhat less aggravated presumably because of this. I felt I would give debridement another try today 10/25/15; no major change in this patient's wounds. We are struggling to get her product that she can afford into her own home through her insurance. 11/01/15; no major change in the patient's wounds. I have been using silver alginate as the most affordable product. I spoke to Dr. Marla Roe last week with her requested take her to the OR for surgical debridement and placement of ACEL. Dr. Marla Roe told me that she would be willing to do this however [facility] will not cover this, fortunately the patient has [place] healthcare of some variant 11/08/15; no major change in the patient's wounds. She has been completely nonviable surface that this but is in too much pain with any attempted debridement are clinic. I have arranged for her to see Dr. Marla Roe ham of plastic surgery and this appointment is on  Monday. I am  hopeful that they will take her to the OR for debridement, possible ACEL ultimately possible skin graft 11/22/15 no major change in the patient's wounds over her bilateral medial calcaneus medial malleolus into the lower legs. Surface on these does not look too bad however on the left there is surrounding erythema and tenderness. This may be cellulitis or could him sleepy tinea. 11/29/15; no major changes in the patient's wounds over her bilateral medial malleolus. There is no infection here and I don't think any additional antibiotics are necessary. There is now plan to move forward. She sees Dr. Marla Roe in a week's time for preparation for operative debridement and ACEL placement I believe on 7/12. She then has a follow-up appointment with Dr. Marla Roe on 7/21 12/28/15; the patient returns today having been taken to the [facility] by Dr. Marla Roe 12/12/15 she underwent debridement, intraoperative cultures [which were negative]. She had placement of a wound VAC. Parent really ACEL was not available to be placed. The wound VAC foam apparently adhered to the wound since then she's been using silver alginate, Xeroform under Ace wraps. She still says there is a lot of drainage and a lot of pain 01/31/16; this is a patient I see monthly. I had referred her to Dr. Marla Roe him of plastic surgery for large wounds on her bilateral medial ankles. She has been to the OR twice once in early July and once in early August. She tells me over the last 3 weeks she has been using the wound VAC with ACEL underneath it. On the right we've simply been using silver alginate. Under Kerlix Coban wraps. 02/28/16; this is a patient I'm currently seeing monthly. She is gone on to have a skin graft over her large venous insufficiency ulcer on the left medial ankle. This was done by Dr. Marla Roe him. The patient is a bit perturbed about why she didn't have one on her right medial ankle wound. She has been using silver alginate to  this. 03/06/16; I received a phone call from her plastic surgery Dr. Marla Roe. She expressed some concern about the viability of the skin graft she did on the left medial ankle wound. Asked me to place Endoform on this. She told me she is not planning to do a subsequent skin graft on the right as the left one did not take very well. I had placed Hydrofera Blue on the right 03/13/16; continue to have a reasonably healthy wound on the right medial ankle. Down to 3 mm in terms of size. There is epithelialization here. The area on the left medial ankle is her skin graft site. I suppose the last week this looks somewhat better. She has an open area inferiorly however in the center there appears to be some viable tissue. There is a lot of surface callus and eschar that will eventually need to come off however none of this looked to be infected. Patient states that the is able to keep the dressing on for several days which is an improvement. 03/20/16 no major change in the circumference of either wound however on the left side the patient was at Dr. Eusebio Friendly office and they did a debridement of left wound. 50% of the wound seems to be epithelialized. I been using Endoform on the left Hydrofera Blue in the right 03/27/16; she arrives today with her wound is not looking as healthy as they did last week. The area on the right clearly has an adherent surface to this a very  similar surface on the left. Unfortunately for this patient this is all too familiar problem. Clearly the Endoform is not working and will need to change that today that has some potential to help this surface. She does not tolerate debridement in this clinic very well. She is changing the dressing wants 04/03/16; patient arrives with the wounds looking somewhat better especially on the right. Dr. Migdalia Dk change the dressing to silver alginate when she saw her on Monday and also sold her some compression socks. The usefulness of the latter  is really not clear and woman with severely draining wounds. 04/10/16; the patient is doing a bit of an experiment wearing the compression stockings that Dr. Migdalia Dk provided her to her left leg and the out of legs based dressings that we provided to the right. 05/01/16; the patient is continuing to wear compression stockings Dr. Migdalia Dk provided her on the left that are apparently silver impregnated. She has been using Iodoflex to the right leg wound. Still a moderate amount of drainage, when she leaves here the wraps only last for 4 days. She has to change the stocking on the left leg every night 05/15/16; she is now using compression stockings bilaterally provided by Dr. Marla Roe. She is wearing a nonadherent layer over the wounds so really I don't think there is anything specific being done to this now. She has some reduction on the left wound. The right is stable. I think all healing here is being done without a specific dressing 06/09/16; patient arrives here today with not much change in the wound certainly in diameter to large circular wounds over the medial aspect of her ankle bilaterally. Under the light of these services are certainly not viable for healing. There is no evidence of surrounding infection. She is wearing compression stockings with some sort of silver impregnation as prescribed by Dr. Marla Roe. She has a follow-up with her tomorrow. 06/30/16; no major change in the size or condition of her wounds. These are still probably covered with a nonviable surface. She is using only her purchase stockings. She did see Dr. Marla Roe who seemed to want to apply Dakin's solution to this I'm not extreme short what value this would be. I would suggest Iodoflex which she still has at home. 07/28/16; I follow Mrs. Budhram episodically along with Dr. Marla Roe. 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 She has been applying a topical  collagen ointment to both wounds with Adaptic. I don't think Dr. Marla Roe is planning to take her back to the OR. 08/19/16; I follow Mrs. Jeneen Rinks on a monthly basis along with Dr. Marla Roe of plastic surgery. 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 I been following her for a number of years. At one point I was able to get the right medial malleolus wound to heal and had the left medial malleolus down to about half its current size however and I had to send her to plastic surgery for an operative debridement. Since then things have been stable to slightly improve the area on the right is slightly better one in the left about the same although there is much less adherent surface than I'm used to with this patient. She is using some form of liquid collagen gel that Dr. Marla Roe provided a Kerlix cover with the patient's own pressure stockings. She tells me that she has extreme pain in both ankles and along the  lateral aspect of both feet. She has been unable to work for some period of time. She is telling me she is retiring at the beginning of April. She sees Dr. Doran Durand of orthopedics next week 09/22/16; patient has not seen Dr. Marla Roe since the last time she is here. I'm not really sure what she is using to the wounds other than bits and pieces of think she had left over including most recently Hydrofera Blue. She is using juxtalite stockings. She is having difficulty with her husband's recent illness "stroke". She is having to transport him to various doctors appointments. Dr. Marla Roe left her the option of a repeat debridement with ACEL however she has not been able to get the time to follow-up on this. She continues to have a fair amount of drainage out of these wounds with certainly precludes leaving dressings on all week 10/13/16; patient has not seen Dr. Marla Roe since she was last in our clinic. I'm not really sure  what she is doing with the wounds, we did try to get her [facility] and I think she is actually using this most of the time. Because of drainage she states she has to change this every second day although this is an improvement from what she used to do. She went to see Dr. Doran Durand who did not think she had a muscular issue with regards to her feet, he referred her to a neurologist and I think the appointment is sometime in June. I changed her back to Iodoflex which she has used in the past but not recently. 11/03/16; the patient has been using Iodoflex although she ran out of this. Still claims that there is a lot of drainage although the wound does not look like this. No surrounding erythema. She has not been back to see Dr. Marla Roe 11/24/16; the patient has been using Iodoflex again but she ran out of it 2 or 3 days ago. There is no major change in the condition of either one of these wounds in fact they are larger and covered in a thick adherent surface slough/nonviable tissue especially on the left. She does not tolerate mechanical debridement in our clinic. Going back to see Dr. Marla Roe of plastic surgery for an operative debridement would seem reasonable. 12/15/16; the patient has not been back to see Dr. Marla Roe. She is been dealing with a series of illnesses and her husband which of monopolized her time. She is been using Sorbact which we largely supplied. She states the drainage is bad enough that it maximum she can go 2-3 days without changing the dressing 01/12/2017 -- the patient has not been back for about 4 weeks and has not seen Dr. Marla Roe not does she have any appointment pending. 01/23/17; patient has not seen Dr. Marla Roe even though I suggested this previously. She is using Santyl that was suggested last week by Dr. Con Memos this Cost her $16 through her insurance which is indeed surprising 02/12/17; continuing Santyl and the patient is changing this daily. A lot of  drainage. She has not been back to see plastic surgery she is using an Ace wrap. Our intake nurse suggested wrap around stockings which would make a good reasonable alternative 02/26/17; patient is been using Santyl and changing this daily due to drainage. She has not been to see plastic surgery she uses in April Ace wrap to control the edema. She did obtain extremitease stockings but stated that the edema in her leg was to big for these 03/20/17; patient  is using Santyl and Anasept. Surfaces looked better today the area on the right is actually measuring a little smaller. She has states she has a lot of pain in her feet and ankles and is asking for a consult to pain control which I'll try to help her with through our case manager. 04/10/17; the patient arrives with better-looking wound surfaces and is slightly smaller wound on the left she is using a combination of Santyl and Anasept. She has an appointment or at least as started in the pain control center associated with [facility] 05/14/17; this is a patient who I followed for a prolonged period of time. She has venous insufficiency ulcers on her bilateral medial ankles. At one point I had this down to a much smaller wound on the left however these reopened and we've never been able to get these to heal. She has been using Santyl and Anasept gel although 2 weeks ago she ran out of the Anasept gel. She has a stable appearance of the wound. She is going to the wound care clinic at [facility]. They wanted do a nerve block/spinal block although she tells me she is reluctant to go forward with that. 05/21/17; this is a patient I have followed for many years. She has venous insufficiency ulcers on her bilateral medial ankles. Chronic pain and deformity in her ankles as well. She is been to see plastic surgery as well as orthopedics. Using PolyMem AG most recently/Kerramax/ABDs and 2 layer compression. She has managed to keep this on and she is  coming in for a nurse check to change the dressing on Tuesdays, we see her on Fridays 06/05/17; really quite a good looking surface and the area especially on the right medial has contracted in terms of dimensions. Well granulated healthy-looking tissue on both sides. Even with an open curet there is nothing that even feels abnormal here. This is as good as I've seen this in quite some time. We have been using PolyMem AG and bringing her in for a nurse check 06/12/17; really quite good surface on both of these wounds. The right medial has contracted a bit left is not. We've been using PolyMem and AG and she is coming in for a nurse visit 06/19/17; we have been using PolyMem AG and bringing her in for a nurse check. Dimensions of her wounds are not better but the surfaces looked better bilaterally. She complained of bleeding last night and the left wound and increasing pain bilaterally. She states her wound pain is more neuropathic than just the wounds. There was some suggestion that this was radicular from her pain management doctor in talking to her it is really difficult to sort this out. 06/26/17; using PolyMem and AG and bringing her in for a nurse check as All of this and reasonably stable condition. Certainly not improved. The dimensions on the lateral part of the right leg look better but not really measuring better. The medial aspect on the left is about the same. 07/03/16; we have been using PolyMen AG and bringing her in for a nurse check to change the dressings as the wounds have drainage which precludes once weekly changing. We are using all secondary absorptive dressings.our intake nurse is brought up the idea of using a wound VAC/snap VAC on the wound to help with the drainage to see if this would result in some contraction. This is not a bad idea. The area on the right medial is actually looking smaller. Both wounds  have a reasonable-looking surface. There is no evidence of cellulitis. The  edema is well controlled 07/10/17; the patient was denied for a snap VAC by her insurance. The major issue with these wounds continues to be drainage. We are using wicked PolyMem AG and she is coming in for a nurse visit to change this. The wounds are stable to slightly improved. The surface looks vibrant and the area on the right certainly has shrunk in size but very slowly 07/17/17; the patient still has large wounds on her bilateral medial malleoli. Surface of both of these wounds looks better. The dimensions seem to come and go but no consistent improvement. There is no epithelialization. We do not have options for advanced treatment products due to insurance issues. They did not approve of the wound VAC to help control the drainage. More recently we've been using PolyMem and AG wicked to allow drainage through. We have been bringing her in for a nurse visit to change this. We do not have a lot of options for wound care products and the home again due to insurance issues 07/24/17; the patient's wound actually looks somewhat better today. No drainage measurements are smaller still healthy-looking surface. We used silver collagen under PolyMen started last week. We have been bringing her in for a dressing change 07/31/17; patient's wound surface continued to look better and I think there is visible change in the dimensions of the wound on the right. Rims of epithelialization. We have been using silver collagen under PolyMen and bringing her in for a dressing change. There appears to be less drainage although she is still in need of the dressing change 08/07/17. Patient's wound surface continues to look better on both sides and the area on the right is definitely smaller. We have been using silver collagen and PolyMen. She feels that the drainage has been it has been better. I asked her about her vascular status. She went to see Dr. Aleda Grana at [facility] and had some form of ablation. I don't have  much detail on this. I haven't my notes from 2016 that she was not a candidate for any further ablation but I don't have any more information on this. We had referred her to vein and vascular I don't think she ever went. He does not have a history of PAD although I don't have any information on this either. We don't even have ABIs in our record 08/14/17; we've been using silver collagen and PolyMen cover. And putting the patient and compression. She we are bringing her in as a nurse visit to change this because ofarge amount of drainage. We didn't the ABIs in clinic today since they had been done in many moons 1.2 bilaterally. She has been to see vein and vascular however this was at [facility] and she had ablation although I really don't have any information on this all seemed biking get a report. She is also been operatively debrided by plastic surgery and had a cell placed probably 8-12 months ago. This didn't have a major effect. We've been making some gains with current dressings 08/19/17-She is here in follow-up evaluation for bilateral medial malleoli ulcers. She continues to tolerate debridement very poorly. We will continue with recently changed topical treatment; if no significant improvement may consider switching to Iodosorb/Iodoflex. 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 She has been using silver collagen and PolyMem. I believe she has been used and tried on Iodoflex before. During her  trip to the clinic we've been watching her wound with Anasept spray and I would like to encourage this on thenurse visit days 09/04/17 bilateral medial malleoli ulcers area is her chronic related to chronic venous insufficiency. These have been very refractory over time. We have been using silver collagen and PolyMen. She is coming in once a week for a doctor's and once a week for nurse visits. We are actually making some progress 09/18/17; the patient's  wounds are smaller especially on the right medial. She arrives today to upset to consider even washing these off with Anasept which I think is been part of the reason this is been closing. We've been using collagen covered in PolyMen otherwise. It is noted that she has a small area of folliculitis on the right medial calf that. As we are wrapping her legs I'll give her a short course of doxycycline to make sure this doesn't amount to anything. She is a long list of complaints today including imbalance, shortness of breath on exertion, inversion of her left ankle. With regards to the latter complaints she is been to see orthopedics and they offered her a tendon release surgery I believe but wanted her wounds to be closed first. I have recommended she go see her primary physician with regards to everything else. 09/25/17; patient's wounds are about the same size. We have made some progress bilaterally although not in recent weeks. She will not allow me T wash these wounds with Anasept even if she is doing her cell. Wheeze we've been using collagen covered in PolyMen. Last week she had a small area of folliculitis this is now opened into a small wound. She completed 5 days of trimethoprim sulfamethoxazole 10/02/17; unfortunately the area on her left medial ankle is worse with a larger wound area towards the Achilles. The patient complains of a lot of pain. She will not allow debridement although visually I don't think there is anything to debridement in any case. We have been using silver collagen and PolyMen for several months now. Initially we are making some progress although I'm not really seeing that today. We will move back to [facility]. His admittedly this is a bit of a repeat however I'm hoping that his situation is different now. The patient tells me she had her leg on the left give out on her yesterday this is process some pain. 10/09/17; the patient is seen twice a week largely because of  drainage issues coming out of the chronic medial bimalleolar wounds that are chronic. Last week the dimensions of the one on the left looks a little larger I changed her to [facility]. She comes in today with a history of terrible pain in the bilateral wound areas. She will not allow debridement. She will not even allow a tissue culture. There is no surrounding erythema no no evidence of cellulitis. We have been putting her Kerlix Coban man. She will not allow more aggressive compression as there was a suggestion to put her in 3 layer wraps. 10/16/17; large wounds on her bilateral medial malleoli. These are chronic. Not much change from last week. The surface looks have healthy but absolutely no epithelialization. A lot of pain little less so of drainage. She will not allow debridement or even washing these off in the vigorous fashion with Anasept. 10/23/17; large wounds on her bilateral malleoli which are chronic. Some improvement in terms of size perhaps on the right since last time I saw these. She states that after  we increased the 3 layer compression there was some bleeding, when she came in for a nurse visit she did not want 3 layer compression put back on about our nurse managed to convince her. She has known chronic venous visit issues and I'm hoping to get her to tolerate the 3 layer compression. using Hydrofera Blue 10/30/17; absolutely no change in the condition of either wound although we've had some improvement in dimensions on the right.. Attempted to put her in 3 layer compression she didn't tolerated she is back in 2 layer compression. We've been using Hydrofera Blue We looked over her past records. She had venous reflux studies in November 2016. There was no evidence of deep venous reflux on the right. Superficial vein did not show the greater saphenous vein at think this is been previously ablated the small saphenous vein was within normal limits. The left deep venous system showed  no DVT the vessels were positive for deep venous reflux in the posterior tibial veins at the ankle. The greater saphenous vein was surgically absent small saphenous vein was within normal limits. She went to vein and vascular at [facility]. I believe she had an ablation on the left greater saphenous vein. I'll update her reflux studies perhaps ever reviewed by vein and vascular. We've made absolutely no progress in these wounds. Will also try to read and TheraSkins through her insurance 11/06/17; W the patient apparently has a 2 week follow-up with vein and vascular I like him to review the whole issue with regards to her previous vascular workup by Dr. Aleda Grana. We've really made no progress on these wounds in many months. She arrives today with less viable looking surface on the left medial ankle wound. This was apparently looking about the same on Tuesday when she was here for nurse visit. 11/13/17; deep tissue culture I did last time of the left lower leg showed multiple organisms without any predominating. In particular no Staphylococcus or group A strep were isolated. We sent her for venous reflux studies. She's had a previous left greater saphenous vein stripping and I think sclerotherapy of the right greater saphenous vein. She didn't really look at the lesser saphenous vein this both wounds are on the medial aspect. She has reflux in the common femoral vein and popliteal vein and an accessory vein on the right and the common femoral vein and popliteal vein on the left. I'm going to have her go to see vein and vascular just the look over things and see if anything else beside aggressive compression is indicated here. We have not been able to make any progress on these wounds in spite of the fact that the surface of the wounds is never look too bad. 11/20/17; no major change in the condition of the wounds. Patient reports a large amount of drainage. She has a lot of complaints of pain  although enlisting her today I wonder if some of this at least his neuropathic rather than secondary to her wounds. She has an appointment with vein and vascular on 12/30/17. The refractory nature of these wounds in my mind at least need vein and vascular to look over the wounds the recent reflux studies we did and her history to see if anything further can be done here. I also note her gait is deteriorated quite a bit. 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 She has a bilateral Trendelenburg gait. I wonder if this is neuropathic or perhaps multilevel radicular. 11/27/17;  her wounds actually looks slightly better. Healthy-looking granulation tissue a scant amount of epithelialization. [place] healthcare will not pay for [employer]. They will play for tri layer Oasis and Dermagraft. This is not a diabetic ulcer. We'll try for the tri layer Oasis. She still complains of some drainage. She has a vein and vascular appointment on 12/30/17 12/04/17; the wounds visually look quite good. Healthy-looking granulation with some degree of epithelialization. We are still waiting for response to our request for trial to try layer Oasis. Her appointment with vascular to review venous and arterial issues isn't sold the end of July 7/31. Not allow debridement or even vigorous cleansing of the wound surface. 12/18/17; slightly smaller especially on the right. 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 We've been using Hydrofera Blue. We still are looking into triple layer Oasis through her insurance 01/08/18 on evaluation today patient's wound actually appears to be showing signs of good improvement at this point in time. She has been tolerating the dressing changes without complication. Fortunately there does not appear to be any evidence of infection at this point in time. We have been utilizing silver nitrate which does seem to be of benefit for her which is also good news. Overall  I'm very happy with how things seem to be both regards appearance as well as measurement. Patient did see Dr. Bridgett Larsson for evaluation on 12/30/17. In his assessment he felt that stripping would not likely add much more than chronic compression to the patient's healing process. His recommendation was to follow-up in three months with Dr. Doren Custard if she hasn't healed in order to consider referral back to you and see vascular where she previously was in a trial and was able to get her wound to heal. I'll be see what she feels she when you staying compression and he reiterated this as well. 01/13/18 on evaluation today patient appears to actually be doing very well in regard to her bilateral medial malleolus ulcers. She seems to have tolerated the chemical cauterization with silver nitrate last week she did have some pain through that evening but fortunately states that I'll be see since it seems to be doing better she is overall pleased with the progress. 01/21/18; really quite a remarkable improvement since I've last seen these wounds. We started using silver nitrate specially on the islands of hyper granulation which for some reason her around the wound circumference. This is really done quite nicely. Primary dressing Hydrofera Blue under 4 layer compression. She seems to be able to hold out without a nurse rewrap. Follow-up in 1 week 01/28/18; we've continued the hydrofera blue but continued with chemical cauterization to the wound area that we started about a month ago for irregular hyper granulation. She is made almost stunning improvement in the overall wound dimensions. I was not really expecting this degree of improvement in these chronic wounds 02/05/18; we continue with Hydrofera Bluebut of also continued the aggressive chemical cauterization with silver nitrate. We made nice progress with the right greater than left wound. 02/12/18. We continued with Hydrofera Blue after aggressive chemical cauterization  with silver nitrate. We appear to be making nice progress with both wound areas 02/19/2018; we continue with [facility] after washing the wounds vigorously with Anasept spray and chemical cauterization with silver nitrate. We are making excellent progress. The area on the right's just about closed 02/26/2018. The area on the left medial ankle had too much necrotic debris today. I used a #5 curette we are able  to get most of the soft. I continued with the silver nitrate to the much smaller wound on the right medial ankle she had a new area on her right lower pretibial area which she says was due to a role in her compression 03/05/2018; both wound areas look healthy. Not much change in dimensions from last week. I continue to use silver nitrate and Hydrofera Blue. The patient saw Dr. Doren Custard of vein and vascular. He felt she had venous stasis ulcers. He felt based on her previous arteriogram she should have adequate circulation for healing. Also she has deep venous reflux but really no significant correctable superficial venous reflux at this time. He felt we should continue with conservative management including leg elevation and compression 04/02/2018; since we last saw this woman about a month ago she had a fall apparently suffered a pelvic fracture. I did not look up the x-ray. Nevertheless because of pain she literally was bedbound for 2 weeks and had home health coming out to change the dressing. Somewhat predictably this is resulted in considerable improvement in both wound areas. The right is just about closed on the medial malleolus and the left is about half the size. 04/16/2018; both her wounds continue to go down in size. Using Hydrofera Blue. 05/07/18; both her wounds appeared to be improving especially on the right where it is almost closed. We are using Hydrofera Blue 05/14/2018; slightly worse this week with larger wounds. Surface on the left medial not quite as good. We have been using  Hydrofera Blue 05/21/18; again the wounds are slightly larger. Left medial malleolus slightly larger with eschar around the circumference. We have been using Hydrofera Blue undergoing a wraps for a prolonged period of time. This got a lot better when she was more recumbent due to a fall and a back injury. I change the primary dressing the silver alginate today. She did not tolerate a 4 layer compression previously although I may need to bring this up with her next time 05/28/2018; area on the left medial malleolus again is slightly larger with more drainage. Area on the right is roughly unchanged. She has a small area of folliculitis on the right medial just on the lower calf. This does not look ominous. 06/03/2018 left medial malleolus slightly smaller in a better looking surface. We used silver nitrate on this last time with silver alginate. The area on the right appears slightly smaller 1/10; left medial malleolus slightly smaller. Small open area on the right. We used silver nitrate and silver alginate as of 2 weeks ago. We continue with the wound and compression. These got a lot better when she was off her feet 1/17; right medial malleolus wound is smaller. The left may be slightly smaller. Both surfaces look somewhat better. 1/24; both wounds are slightly smaller. Using silver alginate under Unna boots 1/31; both wounds appear smaller in fact the area on the right medial is just about closed. Surface eschar. We have been using silver alginate under Unna boots. The patient is less active now spends let much less time on her feet and I think this is contributed to the general improvement in the wound condition 2/7; both wounds appear smaller. I was hopeful the right medial would be closed however there there is still the same small open area. Slight amount of surface eschar on the left the dimensions are smaller there is eschar but the wound edges appear to be free. We have been using silver  alginate under [place]  boot's 2/14; both wounds once again measure smaller. Circumferential eschar on the left medial. We have been using silver alginate under Unna boots with gradual improvement 2/21; the area on the right medial malleolus has healed. The area on the left is smaller. We have been using silver alginate and Unna boots. We can discharge wrapping the right leg she has 20/30 stockings at home she will need to protect the scar tissue in this area 2/28; the area on the right medial malleolus remains closed the patient has a compression stocking. The area on the left is smaller. We have been using silver alginate and Unna boots. 3/6 the area on the right medial ankle remains closed. Good edema control noted she is using her own compression stocking. The area on the left medial ankle is smaller. We have been managing this with silver alginate and Unna boots which we will continue today. 3/13; the area on the right medial ankle remains closed and I'm declaring it healed today. When necessary the left is about the same still a healthy-looking surface but no major change and wound area. No evidence of infection and using silver alginate under unna and generally making considerable improvement 3/27 the area on the right medial ankle remains closed the area on the left is about the same as last week. Certainly not any worse we have been using silver alginate under an Unna boot 4/3; the area on the right medial ankle remains closed per the patient. We did not look at this wound. The wound on the left medial ankle is about the same surface looks healthy we have been using silver alginate under an Unna boot 4/10; area on the right medial ankle remains closed per the patient. We did not look at this wound. The wound on the left medial ankle is slightly larger. The patient complains that the [facility] caused burning pain all week. She also told us that she was a lot more active this week. Changed  her back to silver alginate 4/17; right medial ankle still closed per the patient. Left medial ankle is slightly larger. Using silver alginate. She did not tolerate Hydrofera Blue on this area 4/24; right medial ankle remains closed we have not look at this. The left medial ankle continues to get larger today by about a centimeter. We have been using silver alginate under Unna boots. She complains about 4 layer compression as an alternative. She has been up on her feet working on her garden 5/8; right medial ankle remains closed we did not look at this. The left medial ankle has increased in size about 100%. We have been using silver alginate under Unna boots. She noted increased pain this week and was not surprised that the wound is deteriorated 5/15; no major change in SA however much less erythema ( one week of doxy ocellulitis). 5/22-65 year old female returns at 1 week to the clinic for left medial ankle wound for which we have been using silver alginate under 3 layer compression She was placed on DOXY at last visit - the wound is wider at this visit. She is in 3 layer compression 5/29; change to [facility] last week. I had given her empiric doxycycline 2 weeks ago for a week. She is in 3 layer compression. She complains of a lot of pain and drainage on presentation today. 6/5; using Hydrofera Blue. I gave her doxycycline recently empirically for erythema and pain around the wound. Believe her cultures showed enterococcus which not would not have  been well covered by doxycycline nevertheless the wound looks better and I don't feel specifically that the enterococcus needs to be covered. She has a new what looks like a wrap injury on her lateral left ankle. 6/12; she is using Hydrofera Blue. She has a new area on the left anterior lower tibial area. This was a wrap injury last week. 6/19; the patient is using Hydrofera Blue. She arrived with marked inflammation and erythema around the wound  and tenderness. 12/01/18 on evaluation today patient appears to be doing a little bit better based on what I'm hearing from the standpoint of lassos evaluation to this as far as the overall appearance of the wound is concerned. Then sometime substandard she typically sees Dr. Dellia Nims. Nonetheless overall very pleased with the progress that she's made up to this point. No fevers, chills, nausea, or vomiting noted at this time. 7/10; some improvement in the surface area. Aggressively debrided last week apparently. I went ahead with the debridement today although the patient does not tolerate this very well. We have been using Iodoflex. Still a fair amount of drainage 7/17; slightly smaller. Using Iodoflex. 7/24; no change from last week in terms of surface area. We have been using Iodoflex. Surface looks and continues to look somewhat better 7/31; surface area slightly smaller better looking surface. We have been using Iodoflex. This is under Unna boot compression 8/7-Patient presents at 1 week with Unna boot and Iodoflex, wound appears better 8/14-Patient presents at 1 week with Iodoflex, we use the Unna boot, wound appears to be stable better.Patient is getting Botox treatment for the inversion of the foot for tendon release, Next week 8/21; we are using Iodoflex. Unna boot. The wound is stable in terms of surface area. Under illumination there is some areas of the wound that appear to be either epithelialized or perhaps this is adherent slough at this point I was not really clear. It did not wipe off and I was reluctant to debride this today. 8/28; we are using Iodoflex in an Unna boot. Seems to be making good improvement. 9/4; using Iodoflex and wound is slightly smaller. 9/18; we are using Iodoflex with topical silver nitrate when she is here. The wound continues to be smaller 10/2; patient missed her appointment last week due to GI issues. She left and Iodoflex based dressing on for 2 weeks. Wound  is about the same size about the size of a dime on the left medial lower 10/9 we have been using Iodoflex on the medial left ankle wound. She has a new superficial probable wrap injury on the dorsal left ankle 10/16; we have been using Hydrofera Blue since last week. This is on the left medial ankle 10/23; we have been using Hydrofera Blue since 2 weeks ago. This is on the left medial ankle. Dimensions are better 11/6; using Hydrofera Blue. I think the wound is smaller but still not closed. Left medial ankle 11/13; we have been using Hydrofera Blue. Wound is certainly no smaller this week. Also the surface not as good. This is the remanent of a very large area on her left medial ankle. 11/20; using Sorbact since last week. Wound was about the same in terms of size although I was disappointed about the surface debris 12/11; 3-week follow-up. Patient was on vacation. Wound is measuring slightly larger we have been using Sorbact. 12/18; wound is about the same size however surface looks better last week after debridement. We have been using Sorbact under compression 1/15  wound is probably twice the size of last time increased in length nonviable surface. We have been using Sorbact. She was running a mild fever and missed her appointment last week 1/22; the wound is come down in size but under illumination still a very adherent debris we have been Hydrofera Blue that I changed her to last week 1/29; dimensions down slightly. We have been using Hydrofera Blue 2/19 dimensions are the same however there is rims of epithelialization under illumination. Therefore more the surface area may be epithelialized 2/26; the patient's wound actually measures smaller. The wound looks healthy. We have been using Hydrofera Blue. I had some thoughts about running Apligraf then I still may do that however this looks so much better this week we will delay that for now 3/5; the wound is small but about the same as last  week. We have been using Hydrofera Blue. No debridement is required today. 3/19; the wound is about the size of a dime. Healthy looking wound even under illumination. We have been using Hydrofera Blue. No mechanical debridement is necessary 3/26; not much change from last week although still looks very healthy. We have been using Hydrofera Blue under Unna boots Patient was offered an ankle fusion by podiatry but not until the wound heals with a proceed with this. 4/9; the patient comes in today with her original wound on the medial ankle looking satisfactory however she has some uncontrolled swelling in the middle part of her leg with 2 new open areas superiorly just lateral to the tibia. I think this was probably a wrap issue. She said she felt uncomfortable during the week but did not call in. We have been using Hydrofera Blue 4/16; the wound on the medial ankle is about the same. She has innumerable small areas superior to this across her mid tibia. I think this is probably folliculitis. She is also been working in the yard doing a lot of sweating 4/30; the patient issue on the upper areas across her mid tibia of all healed. I think this was excessive yard work if I remember. Her wound on the medial ankle is smaller. Some debris on this we have been using Hydrofera Blue under Unna boots 5/7; mid tibia. She has been using Hydrofera Blue under an Unna wrap. She is apparently going for her ankle surgery on June 3 10/28/19-Patient returns to clinic with the ankle wound, we are using Hydrofera Blue under Unna wrap, surgery is scheduled for her left foot for June 3 so she will be back for nurse visit next week READMISSION 01/17/2020 [name] is a 64 year old woman we have had in this clinic for a long period of time with severe venous hypertension and refractory wounds on her medial lower legs and ankles bilaterally. 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 When she was off her legs for a prolonged period example when she fell and suffered a compression fracture things would heal up quite nicely. She is now retired and we managed to heal up the right medial leg wound. The left one was very tiny last time I saw this although still refractory. 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 It got to the point where this was interfering with her walking and she elected to proceed with  a ankle arthrodesis to straighten her her ankle and leave her with a functional outcome for mobilization. The patient was referred to Dr. Doren Custard and really this took some time to arrange. Dr. Doren Custard saw her on 12/07/2019. Once again he verified that she had no arterial issues. She had previously had an angiogram several years ago. Follow-up ABIs on the left showed an ABI of 1.12 with triphasic waveforms and a TBI of 0.92. She is felt to have chronic deep venous insufficiency but I do not think it was felt that anything could be done from about this from an ablation point of view. At the time Dr. Doren Custard saw this patient the wounds actually look closed via the pictures in his clinic. The patient finally underwent her surgery on 12/15/2019. This went reasonably well and there was a good anatomic outcome. She developed a small distal wound dehiscence on the lateral part of the surgical wound. However more problematically she is developed recurrence of the wound on the medial left ankle. There are actually 2 wounds here one in the distal lower leg and 1 pretty much at the level of the medial malleolus. It is a more distal area that is more problematic. She has been using Hydrofera Blue which started on Friday before this she was simply Ace wrapping. There was a culture done that showed Pseudomonas and she is on  ciprofloxacin. A recent CNS on 8/11 was negative. The patient reports some pain but I generally think this is improving. She is using a cam boot completely nonweightbearing using a walker for pivot transfers and a wheelchair 8/24; not much improvement unfortunately she has a surgical wound on the lateral part in the venous insufficiency wound medially. The bottom part of the medial insufficiency wound is still necrotic there is exposed tendon here. We have been using Hydrofera Blue under compression. Her edema control is however better 8/31; patient in for follow-up of his surgical wound on the lateral part of her left leg and chronic venous insufficiency ulcers medially. We put her back in compression last week. She comes in today with a complaint of 3 or 4 days worth of increasing pain. She felt her cam walker was rubbing on the area on the back of her heel. However there is intense erythema seems more likely she has cellulitis. She had 2 cultures done when she was seeing podiatry in the postop. One of them in late July showed Pseudomonas and she received a course of ciprofloxacin the other was negative on 8/11 she is allergic to penicillin with anaphylactoid complaints of hives oral swelling via information in epic 9/9; when I saw this patient last week she had intense anterior erythema around her wound on the right lateral heel and ankle and also into the right medial heel. Some of this was no doubt drainage and her walker boot however I was convinced she had cellulitis. I gave her Levaquin and Bactrim she is finishing up on this now. She is following up with Dr. [name] he saw her yesterday. He is taken her out of the walking boot of course she is still nonweightbearing. Her x-ray was negative for any worrisome features such as soft tissue air etc. Things are a lot better this week. She has home health. We have been using Hydrofera Blue under an [employer] which she put back on yesterday. I did not  wrap her last week 9/17; her surrounding skin looks a lot better. In fact the area on the left lateral  ankle has just a scant amount of eschar. The only remaining wound is the large area on the left medial ankle. Probably about 60% of this is healthy granulation at the surface however she has a significant divot distally. This has adherent debris in it. I been using debridement and silver collagen to try and get this area to fill-in although I do not think we have made much progress this week 9/24; the patient's wound on the left medial ankle looks a lot better. The deeper divot area distally still requires debridement but this is cleaning up quite nicely we have been using silver collagen. The patient is complaining of swelling in her foot and is worried that that is contributing to the nonhealing of the ankle wound. She is also complaining of numbness in her anterior toes 10/4; left medial ankle. The small area distally still has a divot with necrotic material that I have been debriding away. This has an undermining area. She is approved for Apligraf. She saw Dr. [name] her surgeon on 10/1. I think he declared himself is satisfied with the condition of things. Still nonweightbearing till the end of the month. 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 Her surgical wound is well closed. There is no evidence of infection 10/11; the patient arrived in clinic today with the expectation that we be able to put an Apligraf on this area after debridement however she arrives with a relatively large amount of green drainage on the dressing. The patient states that this started on Friday. She has not been systemically unwell. 10/19; culture I did last week showed both Enterococcus and Pseudomonas. I think this came in separate parts because I stopped her ciprofloxacin I gave her and prescribed her linezolid however now looking at the final culture result this is Pseudomonas which  is resistant to quinolones. She has not yet picked up the linezolid apparently phone issues. We are also trying to get a topical antibiotic out of [place] in [place] they can be applied by home health. She is still having green drainage 10/16; the patient has her topical antibiotic from [facility] in [place]. This is a compounded gel with vancomycin and ciprofloxacin and gentamicin. We are applying this on the wound bed with silver alginate over the top with Unna boot wraps. She arrives in clinic today with a lot less ominous looking drainage although she is only use this topical preparation once the second time today. She sees Dr. [name] her surgeon on Friday she has home health changing the dressing 11/2; still using her compounded topical antibiotic under silver alginate. Surface is cleaning up there is less drainage. We had an Apligraf for her today and I elected to apply it. A light coating of her antibiotic 04/25/2020 upon evaluation today patient appears to be doing well with regard to her ankle ulcer. There is a little bit of slough noted on the surface of the wound I am can have to perform sharp debridement to clear this away today. With that being said other than that fact overall I feel like she is making progress and we do see some new epithelial growth. There is also some improvement in the depth of the wound and that distal portion. There is little bit of slough there as well. 12/7; 2-week follow-up. Apligraf #3. Dimensions are smaller. Closing in especially inferiorly. Still some surface debris. Still using the [facility] topical antibiotic but I told her that I don't think this needs  to be renewed 12/21; 2-week follow-up. Apligraf #4 dimensions are smaller. Nice improvement 06/05/2020; 2-week follow-up. The patient's wound on the left medial ankle looks really excellent. Nice granulation. Advancing epithelialization no undermining no evidence of infection. We would have  to reapply for another Apligraf but with the condition of this wound I did not feel strongly about it. We used Hydrofera Blue under the same degree of compression. She follows up with Dr. [name] her surgeon a week Friday 06/13/2020 upon evaluation today patient appears to be doing excellent in regard to her wound. She has been tolerating the dressing changes without complication. Fortunately there is no signs of active infection at this time. No fevers, chills, nausea, vomiting, or diarrhea. She was using Hydrofera Blue last week. 06/20/2020 06/20/2020 on evaluation today patient actually appears to be doing excellent in regard to her wound. This is measuring better and looking much better as well. She has been using the collagen that seems to be doing better for her as well even though the [facility] was and is not sticking or feeling as rough on her wound. She did see Dr. [name] on Friday he is very pleased he also stated none of the hardware has shifted. That is great news 1/27; the patient has a small clean wound all that is remaining. I agree that this is too small to really consider an Apligraf. Under illumination the surface is looking quite good. We have been using collagen although the dimensions are not any better this week 2/2; the patient has a small clean wound on the left medial ankle. Although this left of her substantial original areas. Measurements are smaller. We have been using polymen Ag under an Haematologist. 2/10; small area on the left medial ankle. This looks clean nothing to debride however dimensions are about the same we have been using polymen I think now for 2 weeks 2/17; not much change in surface area. We have been using polymen Ag without any improvement. 3/17; 1 month follow-up. The patient has been using endoform without any improvement in fact I think this looks worse with more depth and more expansion 3/24; no improvement. Perhaps less debris on the surface. We have  been using Sorbact for 1 week 4/4; wound measures larger. She has edema in her leg and her foot which she tells as her wrap came down. We have been using Unna boots. Sorbact of the wound. She has been approved for Apligraf 09/12/2020 upon evaluation today patient appears to be doing well with regard to her wound. We did get the Apligraf reapproved which is great news we have that available for application today. Fortunately there is no signs of infection and overall the patient seems to be doing great. The wound bed is nice and clean. 4/27; patient presents for her second application of Apligraf. She states over the past week she has been on her feet more often due to being outside in her garden. She has noted more swelling to her foot as a result. She denies increased warmth, pain or erythema to the wound site. 10/10/2020 upon evaluation today patient appears to be doing well with regard to her wound which does not appear to be quite as irritated as last week from what I am hearing. With that being said unfortunately she is having issues with some erythema and warmth to touch as well as an increase size. I do believe this likely is infected. 10/17/2020 upon evaluation today patient appears to be doing excellent  in regard to her wound this is significantly improved as compared to last week. Fortunately I think that the infection is much better controlled at this point. She did have evidence of both Enterococcus as well as Staphylococcus noted on culture. Enterococcus really would not be helped significantly by the Cipro but the wound is doing so much better I am under the assumption that the Staphylococcus is probably the main organism that is causing the current infection. Nonetheless I think that she is doing excellent as far as that is concerned and I am very pleased in that regard. I would therefore recommend she continue with the Cipro. 10/31/2020 upon evaluation today patient appears to be doing well  with regard to her wound. She has been tolerating the dressing changes without complication. Fortunately there is no signs of active infection and overall I am extremely pleased with where things stand today. No fevers, chills, nausea, vomiting, or diarrhea. With that being said she does have some green drainage coming from the wound and although it looks okay I am a little concerned about the possibility of a continuing infection. Specifically with Pseudomonas. For that reason I will go ahead and send in a prescription for Cipro for her to be continued. 11/14/2020 upon evaluation today patient appears to be doing very well currently in regard to her wound on her leg. She has been tolerating the dressing changes without complication. Fortunately I feel like the infection is finally under good control here. Unfortunately we do not have the Apligraf for application today although we can definitely order to have it in place for next week. That will be her fifth and final of the current series. Nonetheless I feel like her wound is really doing quite well which is great news. 11/21/2020 upon evaluation today patient appears to be doing well with regard to her wound on the medial ankle. Fortunately I think the infection is under control and I do believe we can go ahead and reapply the Apligraf today. She is in agreement with that plan. There does not appear to be any signs of active infection at this time which is great news. No fevers, chills, nausea, vomiting, or diarrhea. Objective Constitutional Well-nourished and well-hydrated in no acute distress. Vitals Time Taken: 10:39 AM, Height: 68 in, Temperature: 98.2 F, Pulse: 57 bpm, Respiratory Rate: 17 breaths/min, Blood Pressure: 130/68 mmHg. Respiratory normal breathing without difficulty. Psychiatric this patient is able to make decisions and demonstrates good insight into disease process. Alert and Oriented x 3. pleasant and cooperative. General  Notes: Upon inspection patient's wound bed showed signs of good granulation epithelization at this point. There does not appear to be any evidence of active infection which is great and overall very pleased with where things stand today. Integumentary (Hair, Skin) Wound #15 status is Open. Original cause of wound was Gradually Appeared. The date acquired was: 12/30/2019. The wound has been in treatment 44 weeks. The wound is located on the Left,Medial Malleolus. The wound measures 3.2cm length x 2.5cm width x 0.1cm depth; 6.283cm^2 area and 0.628cm^3 volume. There is Fat Layer (Subcutaneous Tissue) exposed. There is no tunneling or undermining noted. There is a medium amount of serosanguineous drainage noted. The wound margin is distinct with the outline attached to the wound base. There is large (67-100%) red, pink granulation within the wound bed. There is a small (1-33%) amount of necrotic tissue within the wound bed including Adherent Slough. 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 There was a Excisional Skin/Subcutaneous Tissue Debridement with a total area of 8 sq cm performed by [name], PA. With the following instrument(s): Curette to remove Viable and Non-Viable tissue/material. Material removed includes Subcutaneous Tissue and Slough and after achieving pain control using Lidocaine 4% T opical Solution. No specimens were taken. A time out was conducted at 11:05, prior to the start of the procedure. A Minimum amount of bleeding was controlled with Pressure. The  procedure was tolerated well with a pain level of 4 throughout and a pain level of 3 following the procedure. Post Debridement Measurements: 3.2cm length x 2.5cm width x 0.1cm depth; 0.628cm^3 volume. Character of Wound/Ulcer Post Debridement is improved. Severity of Tissue Post Debridement is: Fat layer exposed. Post procedure Diagnosis Wound #15: Same as Pre-Procedure Pre-procedure diagnosis of Wound #15 is a Venous Leg Ulcer located on the Left,Medial Malleolus. A skin graft procedure using a bioengineered skin substitute/cellular or tissue based product was performed by [name], PA with the following instrument(s): Forceps and Scissors. Apligraf was applied and secured with Steri-Strips. 22 sq cm of product was utilized and 22 sq cm was wasted due to wound size. Post Application, adaptic, gauze, unna was applied. A Time Out was conducted at 11:10, prior to the start of the procedure. The procedure was tolerated well with a pain level of 0 throughout and a pain level of 0 following the procedure. Post procedure Diagnosis Wound #15: Same as Pre-Procedure . Pre-procedure diagnosis of Wound #15 is a Venous Leg Ulcer located on the Left,Medial Malleolus . There was a Haematologist Compression Therapy Procedure by [name], RN. Post procedure Diagnosis Wound #15: Same as Pre-Procedure Plan Follow-up Appointments: Return Appointment in 2 weeks. - with Margarita Grizzle Nurse Visit: - 1 week post apligraf Cellular or Tissue Based Products: Cellular or Tissue Based Product Type: - Apligrag #5 Cellular or Tissue Based Product applied to wound bed, secured with steri-strips, cover with Adaptic or Mepitel. (DO NOT REMOVE). Bathing/ Shower/ Hygiene: May shower with protection but do not get wound dressing(s) wet. Edema Control - Lymphedema / SCD / Other: Elevate legs to the level of the heart or above for 30 minutes daily and/or when sitting, a frequency of: - throughout the day Avoid standing for  long periods of time. Exercise regularly Compression stocking or Garment 20-30 mm/Hg pressure to: - right leg daily WOUND #15: - Malleolus Wound Laterality: Left, Medial Cleanser: Soap and Water 1 x Per Week/30 Days Discharge Instructions: May shower and wash wound with dial antibacterial soap and water prior to dressing change. Peri-Wound Care: Triamcinolone 15 (g) 1 x Per Week/30 Days Discharge Instructions: Use triamcinolone 15 (g) mixed with lotion Peri-Wound Care: Sween Lotion (Moisturizing lotion) 1 x Per Week/30 Days Discharge Instructions: Apply moisturizing lotion as directed Secondary Dressing: Woven Gauze Sponge, Non-Sterile 4x4 in 1 x Per Week/30 Days Discharge Instructions: Apply over primary dressing as directed. Secondary Dressing: ABD Pad, 5x9 1 x Per Week/30 Days Discharge Instructions: Apply over primary dressing as directed. Com pression Wrap: Unnaboot w/Calamine, 4x10 (in/yd) 1 x Per Week/30 Days Discharge Instructions: Apply Unnaboot as directed. 1. I would recommend that we going  continue with the wound care measures as before and the patient is in agreement with plan we did apply the last application of Apligraf for her today. Obviously she will have a nurse visit next week and we will see where things stand in 2 weeks. 2. I am also going to recommend that we have the patient continue to elevate her leg and were also continuing with the compression wrap. We have been using the boot currently. We will see patient back for reevaluation in 1 week here in the clinic. If anything worsens or changes patient will contact our office for additional recommendations. Again this to be a nurse visit and we will see her 2 weeks for a doctor's visit. Electronic Signature(s) Signed: 11/21/2020 11:22:05 AM By: [name] PA-C Entered By: [name] on 11/21/2020 11:22:05 -------------------------------------------------------------------------------- SuperBill  Details Patient Name: Date of Service: [facility] [name], [name] 11/21/2020 Medical Record Number: [id number] Patient Account Number: [id number] Date of Birth/Sex: Treating RN: 11-Dec-1955 (65 y.o. [name], Linda Primary Care Provider: [name] Other Clinician: Referring Provider: Treating Provider/Extender: [name] in Treatment: 71 Diagnosis Coding ICD-10 Codes Code Description [phone] Chronic venous hypertension (idiopathic) with ulcer and inflammation of left lower extremity L97.828 Non-pressure chronic ulcer of other part of left lower leg with other specified severity L97.328 Non-pressure chronic ulcer of left ankle with other specified severity Facility Procedures CPT4 Code: [id number] Description: (Facility Use Only) Apligraf 1 SQ CM Modifier: Quantity: [place] Code: [id number] Description: [id number] - SKIN SUB GRAFT TRNK/ARM/LEG ICD-10 Diagnosis Description L97.328 Non-pressure chronic ulcer of left ankle with other specified severity Modifier: Quantity: 1 Physician Procedures : CPT4 Code Description Modifier [id number] - WC PHYS SKIN SUB GRAFT TRNK/ARM/LEG ICD-10 Diagnosis Description L97.328 Non-pressure chronic ulcer of left ankle with other specified severity Quantity: 1 Electronic Signature(s) Signed: 11/21/2020 11:22:21 AM By: [name] PA-C Entered By: [name] on 11/21/2020 11:22:21

## 2020-11-28 ENCOUNTER — Encounter (HOSPITAL_BASED_OUTPATIENT_CLINIC_OR_DEPARTMENT_OTHER): Payer: Medicare Other | Admitting: Physician Assistant

## 2020-11-28 ENCOUNTER — Other Ambulatory Visit: Payer: Self-pay

## 2020-11-28 DIAGNOSIS — I87332 Chronic venous hypertension (idiopathic) with ulcer and inflammation of left lower extremity: Secondary | ICD-10-CM | POA: Diagnosis not present

## 2020-12-04 NOTE — Progress Notes (Signed)
TRISTACA, DADDIO ([id number]) Visit Report for 11/28/2020 Arrival Information Details Patient Name: Date of Service: [facility], [name] 11/28/2020 10:15 A M Medical Record Number: [id number] Patient Account Number: [id number] Date of Birth/Sex: Treating RN: [birth year] (65 y.o. Nita Sells Primary Care Antionio Negron: [name] Other Clinician: Referring Fia Hebert: Treating Arnola Crittendon/Extender: [name] in Treatment: 41 Visit Information History Since Last Visit All ordered tests and consults were completed: No Patient Arrived: Ambulatory Added or deleted any medications: No Arrival Time: 10:14 Any new allergies or adverse reactions: No Accompanied By: alone Had a fall or experienced change in No Transfer Assistance: None activities of daily living that may affect Patient Identification Verified: Yes risk of falls: Secondary Verification Process Completed: Yes Signs or symptoms of abuse/neglect since last visito No Patient Requires Transmission-Based Precautions: No Hospitalized since last visit: No Patient Has Alerts: Yes Implantable device outside of the clinic excluding No Patient Alerts: L ABI =1.12, TBI = .92 cellular tissue based products placed in the center R ABI= 1.02, TBI= .58 since last visit: Has Dressing in Place as Prescribed: Yes Pain Present Now: No Electronic Signature(s) Signed: 12/04/2020 6:17:43 PM By: Leane Call Entered By: Leane Call on 11/28/2020 10:15:27 -------------------------------------------------------------------------------- Clinic Level of Care Assessment Details Patient Name: Date of Service: [facility] [name], [name] 11/28/2020 10:15 A M Medical Record Number: [id number] Patient Account Number: [id number] Date of Birth/Sex: Treating RN: 08/09/1955 (65 y.o. Nita Sells Primary Care Rmoni Keplinger: [name] Other Clinician: Referring Ravon Mortellaro: Treating Gilberte Gorley/Extender: [name] in Treatment: 45 Clinic Level of Care Assessment Items TOOL 3 Quantity Score '[]'$  - 0 Use when EandM and Procedure is performed on FOLLOW-UP visit ASSESSMENTS - Nursing Assessment / Reassessment '[]'$  - 0 Reassessment of Co-morbidities (includes updates in patient status) X- 1 5 Reassessment of Adherence to Treatment Plan ASSESSMENTS - Wound and Skin Assessment / Reassessment '[]'$  - Points for Wound Assessment can only be taken for a new wound of unknown or different etiology and a procedure is 0 NOT performed to that wound X- 1 5 Simple Wound Assessment / Reassessment - one wound '[]'$  - 0 Complex Wound Assessment / Reassessment - multiple wounds '[]'$  - 0 Dermatologic / Skin Assessment (not related to wound area) ASSESSMENTS - Focused Assessment '[]'$  - 0 Circumferential Edema Measurements - multi extremities '[]'$  - 0 Nutritional Assessment / Counseling / Intervention '[]'$  - 0 Lower Extremity Assessment (monofilament, tuning fork, pulses) '[]'$  - 0 Peripheral Arterial Disease Assessment (using hand held doppler) ASSESSMENTS - Ostomy and/or Continence Assessment and Care '[]'$  - 0 Incontinence Assessment and Management '[]'$  - 0 Ostomy Care Assessment and Management (repouching, etc.) PROCESS - Coordination of Care '[]'$  - Points for Discharge Coordination can only be taken for a new wound of unknown or different etiology and a 0 procedure is NOT performed to that wound X- 1 15 Simple Patient / Family Education for ongoing care '[]'$  - 0 Complex (extensive) Patient / Family Education for ongoing care '[]'$  - 0 Staff obtains Programmer, systems, Records, T Results / Process Orders est '[]'$  - 0 Staff telephones HHA, Nursing Homes / Clarify orders / etc '[]'$  - 0 Routine Transfer to another Facility (non-emergent condition) '[]'$  - 0 Routine Hospital Admission (non-emergent condition) '[]'$  - 0 New Admissions / Biomedical engineer / Ordering NPWT Apligraf, etc. , '[]'$  - 0 Emergency Hospital Admission  (emergent condition) X- 1 10 Simple Discharge Coordination '[]'$  - 0 Complex (extensive) Discharge Coordination  PROCESS - Special Needs '[]'$  - 0 Pediatric / Minor Patient Management '[]'$  - 0 Isolation Patient Management '[]'$  - 0 Hearing / Language / Visual special needs '[]'$  - 0 Assessment of Community assistance (transportation, D/C planning, etc.) '[]'$  - 0 Additional assistance / Altered mentation '[]'$  - 0 Support Surface(s) Assessment (bed, cushion, seat, etc.) INTERVENTIONS - Wound Cleansing / Measurement '[]'$  - Points for Wound Cleaning / Measurement, Wound Dressing, Specimen Collection and Specimen taken to lab can 0 only be taken for a new wound of unknown or different etiology and a procedure is NOT performed to that wound '[]'$  - 0 Simple Wound Cleansing - one wound '[]'$  - 0 Complex Wound Cleansing - multiple wounds '[]'$  - 0 Wound Imaging (photographs - any number of wounds) '[]'$  - 0 Wound Tracing (instead of photographs) '[]'$  - 0 Simple Wound Measurement - one wound '[]'$  - 0 Complex Wound Measurement - multiple wounds INTERVENTIONS - Wound Dressings '[]'$  - 0 Small Wound Dressing one or multiple wounds X- 1 15 Medium Wound Dressing one or multiple wounds '[]'$  - 0 Large Wound Dressing one or multiple wounds INTERVENTIONS - Miscellaneous '[]'$  - 0 External ear exam '[]'$  - 0 Specimen Collection (cultures, biopsies, blood, body fluids, etc.) '[]'$  - 0 Specimen(s) / Culture(s) sent or taken to Lab for analysis '[]'$  - 0 Patient Transfer (multiple staff / Civil Service fast streamer / Similar devices) '[]'$  - 0 Simple Staple / Suture removal (25 or less) '[]'$  - 0 Complex Staple / Suture removal (26 or more) '[]'$  - 0 Hypo / Hyperglycemic Management (close monitor of Blood Glucose) '[]'$  - 0 Ankle / Brachial Index (ABI) - do not check if billed separately X- 1 5 Vital Signs Has the patient been seen at the hospital within the last three years: Yes Total Score: 55 Level Of Care: New/Established - Level 2 Electronic  Signature(s) Signed: 12/04/2020 6:17:43 PM By: Leane Call Entered By: Leane Call on 11/28/2020 16:52:20 -------------------------------------------------------------------------------- Encounter Discharge Information Details Patient Name: Date of Service: [name], [name] 11/28/2020 10:15 A M Medical Record Number: [id number] Patient Account Number: [id number] Date of Birth/Sex: Treating RN: [birth year] (65 y.o. Nita Sells Primary Care Keiandre Cygan: [name] Other Clinician: Referring Makeila Yamaguchi: Treating Norabelle Kondo/Extender: [name] in Treatment: 57 Encounter Discharge Information Items Discharge Condition: Stable Ambulatory Status: Ambulatory Discharge Destination: Home Transportation: Private Auto Accompanied By: alone Schedule Follow-up Appointment: No Clinical Summary of Care: Patient Declined Electronic Signature(s) Signed: 12/04/2020 6:17:43 PM By: Leane Call Entered By: Leane Call on 11/28/2020 16:51:36 -------------------------------------------------------------------------------- Patient/Caregiver Education Details Patient Name: Date of Service: [name], [name] 6/29/2022andnbsp10:15 A M Medical Record Number: [id number] Patient Account Number: [id number] Date of Birth/Gender: Treating RN: [birth year] (65 y.o. Nita Sells Primary Care Physician: [name] Other Clinician: Referring Physician: Treating Physician/Extender: [name] in Treatment: 95 Education Assessment Education Provided To: Patient Education Topics Provided Basic Hygiene: Methods: Explain/Verbal Responses: State content correctly Electronic Signature(s) Signed: 12/04/2020 6:17:43 PM By: Leane Call Entered By: Leane Call on 11/28/2020 16:51:22 -------------------------------------------------------------------------------- Wound Assessment Details Patient Name: Date of Service: [facility] [name],  [name] 11/28/2020 10:15 A M Medical Record Number: [id number] Patient Account Number: [id number] Date of Birth/Sex: Treating RN: [birth year] (65 y.o. Nita Sells Primary Care Xavian Hardcastle: [name] Other Clinician: Referring Dorrine Montone: Treating Blakelee Allington/Extender: [name] in Treatment: 45 Wound Status Wound Number: 15 Primary Etiology: Venous Leg Ulcer Wound Location: Left, Medial  Malleolus Wound Status: Open Wounding Event: Gradually Appeared Date Acquired: 12/30/2019 Weeks Of Treatment: 45 Clustered Wound: No Wound Measurements Length: (cm) 3.2 Width: (cm) 2.5 Depth: (cm) 0.1 Area: (cm) 6.283 Volume: (cm) 0.628 % Reduction in Area: 46.1% % Reduction in Volume: 93.3% Wound Description Classification: Full Thickness With Exposed Support Structures Treatment Notes Wound #15 (Malleolus) Wound Laterality: Left, Medial Cleanser Soap and Water Discharge Instruction: May shower and wash wound with dial antibacterial soap and water prior to dressing change. Peri-Wound Care Triamcinolone 15 (g) Discharge Instruction: Use triamcinolone 15 (g) mixed with lotion Sween Lotion (Moisturizing lotion) Discharge Instruction: Apply moisturizing lotion as directed Topical Primary Dressing Secondary Dressing Woven Gauze Sponge, Non-Sterile 4x4 in Discharge Instruction: Apply over primary dressing as directed. ABD Pad, 5x9 Discharge Instruction: Apply over primary dressing as directed. Secured With Compression Wrap Unnaboot w/Calamine, 4x10 (in/yd) Discharge Instruction: Apply Unnaboot as directed. Compression Stockings Add-Ons Electronic Signature(s) Signed: 12/04/2020 6:17:43 PM By: Leane Call Entered By: Leane Call on 11/28/2020 10:49:59 -------------------------------------------------------------------------------- Vitals Details Patient Name: Date of Service: [name], [name] 11/28/2020 10:15 A M Medical Record Number:  [id number] Patient Account Number: [id number] Date of Birth/Sex: Treating RN: [birth year] (65 y.o. Nita Sells Primary Care Ceara Wrightson: [name] Other Clinician: Referring Crystelle Ferrufino: Treating Keltin Baird/Extender: [name] in Treatment: 45 Vital Signs Time Taken: 10:15 Temperature (F): 97.9 Height (in): 68 Pulse (bpm): 58 Respiratory Rate (breaths/min): 16 Blood Pressure (mmHg): 128/67 Reference Range: 80 - 120 mg / dl Electronic Signature(s) Signed: 12/04/2020 6:17:43 PM By: Leane Call Entered By: Leane Call on 11/28/2020 10:15:52

## 2020-12-05 ENCOUNTER — Encounter (HOSPITAL_BASED_OUTPATIENT_CLINIC_OR_DEPARTMENT_OTHER): Payer: Medicare Other | Attending: Physician Assistant | Admitting: Physician Assistant

## 2020-12-05 ENCOUNTER — Other Ambulatory Visit: Payer: Self-pay

## 2020-12-05 DIAGNOSIS — L97828 Non-pressure chronic ulcer of other part of left lower leg with other specified severity: Secondary | ICD-10-CM | POA: Diagnosis not present

## 2020-12-05 DIAGNOSIS — Z880 Allergy status to penicillin: Secondary | ICD-10-CM | POA: Insufficient documentation

## 2020-12-05 DIAGNOSIS — I87332 Chronic venous hypertension (idiopathic) with ulcer and inflammation of left lower extremity: Secondary | ICD-10-CM | POA: Diagnosis not present

## 2020-12-05 DIAGNOSIS — L97328 Non-pressure chronic ulcer of left ankle with other specified severity: Secondary | ICD-10-CM | POA: Insufficient documentation

## 2020-12-05 DIAGNOSIS — L97329 Non-pressure chronic ulcer of left ankle with unspecified severity: Secondary | ICD-10-CM | POA: Diagnosis present

## 2020-12-05 NOTE — Progress Notes (Addendum)
AALYIAH, LECOMTE ([id number]) Visit Report for 12/05/2020 Chief Complaint Document Details Patient Name: Date of Service: [facility] [name], [name] 12/05/2020 10:00 A M Medical Record Number: [id number] Patient Account Number: [id number] Date of Birth/Sex: Treating RN: Jul 27, 1955 (65 y.o. [name] Provider: [name] Other Clinician: Referring Provider: Treating Provider/Extender: [name] in Treatment: 41 Information Obtained from: Patient Chief Complaint patient is been followed long-term in this clinic for venous insufficiency ulcers with inflammation, hypertension and ulceration over the medial ankle bilaterally. 01/17/2020; this is a patient who is here for review of postoperative wounds on the left lateral ankle and recurrence of venous stasis ulceration on the left medial Electronic Signature(s) Signed: 12/05/2020 10:25:58 AM By: [name] PA-C Entered By: [name] on 12/05/2020 10:25:57 -------------------------------------------------------------------------------- Debridement Details Patient Name: Date of Service: [name], [name] 12/05/2020 10:00 A M Medical Record Number: [id number] Patient Account Number: [id number] Date of Birth/Sex: Treating RN: 10/27/1955 (65 y.o. [name] Provider: [name] Other Clinician: Referring Provider: Treating Provider/Extender: [name] in Treatment: 46 Debridement Performed for Assessment: Wound #15 Left,Medial Malleolus Performed By: Physician [name], PA Debridement Type: Debridement Severity of Tissue Pre Debridement: Fat layer exposed Level of Consciousness (Pre-procedure): Awake and Alert Pre-procedure Verification/Time Out Yes - 11:05 Taken: Start Time: 11:06 Pain Control: Lidocaine 5% topical ointment T Area Debrided (L x W): otal 3.2 (cm) x 2.2 (cm) = 7.04 (cm) Tissue and other material debrided: Viable,  Non-Viable, Slough, Subcutaneous, Slough Level: Skin/Subcutaneous Tissue Debridement Description: Excisional Instrument: Curette Bleeding: Minimum Hemostasis Achieved: Pressure End Time: 11:11 Procedural Pain: 4 Post Procedural Pain: 2 Response to Treatment: Procedure was tolerated well Level of Consciousness (Post- Awake and Alert procedure): Post Debridement Measurements of Total Wound Length: (cm) 3.2 Width: (cm) 2.2 Depth: (cm) 0.1 Volume: (cm) 0.553 Character of Wound/Ulcer Post Debridement: Improved Severity of Tissue Post Debridement: Fat layer exposed Post Procedure Diagnosis Same as Pre-procedure Electronic Signature(s) Signed: 12/05/2020 4:43:01 PM By: [name] PA-C Signed: 12/05/2020 6:17:15 PM By: Baruch Gouty RN, BSN Entered By: Baruch Gouty on 12/05/2020 11:11:25 -------------------------------------------------------------------------------- HPI Details Patient Name: Date of Service: [name], [name] 12/05/2020 10:00 A M Medical Record Number: [id number] Patient Account Number: [id number] Date of Birth/Sex: Treating RN: April 24, 1956 (65 y.o. [name] Provider: [name] Other Clinician: Referring Provider: Treating Provider/Extender: [name] in Treatment: 36 History of Present Illness HPI Description: the remaining wound is over the left medial ankle. Similar wound over the right medial ankle healed largely with use of Apligraf. Most recently we have been using Hydrofera Blue over this wound with considerable improvement. The patient has been extensively worked up in the past for her venous insufficiency and she is not a candidate for antireflux surgery although I have none of the details available currently. 08/24/14; considerable improvement today. About 50% of this wound areas now epithelialized. The base of the wound appears to be healthier granulation.as opposed to last week when she had  deteriorated a considerable improvement 08/17/14; unfortunately the wound has regressed somewhat. The areas of epithelialization from the superior aspect are not nearly as healthy as they were last week. The patient thinks her Hydrofera Blue slipped. 09/07/14; unfortunately the area has markedly regressed in the 2 weeks since I've seen this. There is an odor surrounding erythema. The healthy granulation tissue that we  had at the base of the wound now is a dusky color. The nurse reports green drainage 09/14/14; the area looks somewhat better than last week. There is less erythema and less drainage. The culture I did did not show any growth. Nevertheless I think it is better to continue the Cipro and doxycycline for a further week. The remaining wound area was debridement. 09/21/14. Wound did not require debridement last week. Still less erythema and less drainage. She can complete her antibiotics. The areas of epithelialization in the superior aspect of the wound do not look as healthy as they did some weeks ago 10/05/14 continued improvement in the condition of this wound. There is advancing epithelialization. Less aggressive debridement required 10/19/14 continued improvement in the condition and volume of this wound. Less aggressive debridement to the inferior part of this to remove surface slough and fibrinous eschar 11/02/14 no debridement is required. The surface granulation appears healthy although some of her islands of epithelialization seem to have regressed. No evidence of infection 11/16/14; lites surface debridement done of surface eschar. The wound does not look to be unhealthy. No evidence of infection. Unfortunately the patient has had podiatry issues in the right foot and for some reason has redeveloped small surface ulcerations in the medial right ankle. Her original presentation involved wounds in this area 11/23/14 no debridement. The area on the right ankle has enlarged. The left ankle wound  appears stable in terms of the surface although there is periwound inflammation. There has been regression in the amount of new skin 11/30/14 no debridement. Both wound areas appear healthy. There was no evidence of infection. The the new area on the right medial ankle has enlarged although that both the surfaces appear to be stable. 12/07/14; Debridement of the right medial ankle wound. No no debridement was done on the left. 12/14/14 no major change in and now bilateral medial ankle wounds. Both of these are very painful but the no overt evidence of infection. She has had previous venous ablation 12/21/14; patient states that her right medial ankle wound is considerably more painful last week than usual. Her left is also somewhat painful. She could not tolerate debridement. The right medial ankle wound has fibrinous surface eschar 12/28/14 this is a patient with severe bilateral venous insufficiency ulcers. For a considerable period of time we actually had the one on the right medial ankle healed however this recently opened up again in June. The left medial ankle wound has been a refractory area with some absent flows. We had some success with Hydrofera Blue on this area and it literally closed by 50% however it is recently opened up Foley. Both of these were debridement today of surface eschar. She tolerates this poorly 01/25/15: No change in the status of this. Thick adherent escar. Very poor tolerance of any attempt at debridement. I had healed the right medial malleolus wound for a considerable amount of time and had the left one down to about 50% of the volume although this is totally regressed over the last 48 weeks. Further the right leg has reopened. she is trying to make a appointment with pain and vascular, previous ablations with Dr. Aleda Grana. I do not believe there is an arterial insufficiency issue here 02/01/15 the status of the adherent eschar bilaterally is actually improved. No  debridement was done. She did not manage to get vascular studies done 02/08/15 continued debridement of the area was done today. The slough is less adherent and comes off with less  pressure. There is no surrounding infection peripheral pulses are intact 02/15/15 selective debridement with a disposable curette. Again the slough is less adherent and comes off with less difficulty. No surrounding infection peripheral pulses are intact. 02/22/15 selective debridement of the right medial ankle wound. Slough comes off with less difficulty. No obvious surrounding infection peripheral pulses are intact I did not debridement the one on the left. Both of these are stable to improved 03/01/15 selective debridement of both wound areas using a curette to. Adherent slough cup soft with less difficulty. No obvious surrounding infection. The patient tells me that 2 days ago she noted a rash above the right leg wrap. She did not have this on her lower legs when she change this over she arrives with widespread left greater than right almost folliculitis-looking rash which is extremely pruritic. I don't see anything to culture here. There is no rash on the rest of her body. She feels well systemically. 03/08/15; selective debridement of both wounds using a curette. Base of this does not look unhealthy. She had limegreen drainage coming out of the left leg wound and describes a lot of drainage. The rash on her left leg looks improved to. No cultures were done. 03/22/15; patient was not here last week. Basal wounds does not look healthy and there is no surrounding erythema. No drainage. There is still a rash on the left leg that almost looks vasculitic however it is clearly limited to the top of where the wrap would be. 04/05/15; on the right required a surgical debridement of surface eschar and necrotic subcutaneous tissue. I did not debridement the area on the left. These continue to be large open wounds that are not changing  that much. We were successful at one point in healing the area on the right, and at the same time the area on the left was roughly half the size of current measurements. I think a lot of the deterioration has to do with the prolonged time the patient is on her feet at work 04/19/15 I attempted-like surface debridement bilaterally she does not tolerate this. She tells me that she was in allergic care yesterday with extreme pain over her left lateral malleolus/ankle and was told that she has an "sprain" 05/03/15; large bilateral venous insufficiency wounds over the medial malleolus/medial aspect of her ankles. She complains of copious amounts of drainage and his usual large amounts of pain. There is some increasing erythema around the wound on the right extending into the medial aspect of her foot to. historically she came in with these wounds the right one healed and the left one came down to roughly half its current size however the right one is reopened and the left is expanded. 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 05/10/15 large bilateral venous insufficiency wounds. There is less adherence surface left however the surface culture that I did last week grew pseudomonas therefore bilateral selective debridement score necessary. There is surrounding erythema. The patient describes severe bilateral drainage and a lot of pain in the left ankle. Apparently her podiatrist was were ready to do a cortisone shot 05/17/15; the patient complains of pain and again copious amounts of drainage. 05/24/15; we used Iodo flex last week. Patient notes considerable improvement in wound drainage. Only needed to change this once. 05/31/15; we continued Iodoflex; the base of these large wounds bilaterally is not too bad but there is probably likely  a significant bioburden here. I would like to debridement just doesn't tolerate it. 06/06/14 I would like to continue the  Iodoflex although she still hasn't managed to obtain supplies. She has bilateral medial malleoli or large wounds which are mostly superficial. Both of them are covered circumferentially with some nonviable fibrinous slough although she tolerates debridement very poorly. She apparently has an appointment for an ablation on the right leg by interventional radiology. 06/14/15; the patient arrives with the wounds and static condition. We attempted a debridement although she does not do well with this secondary to pain. I 07/05/15; wounds are not much smaller however there appears to be a cleaner granulating base. The left has tight fibrinous slough greater than the right. Debridement is tolerated poorly due to pain. Iodoflex is done more for these wounds in any of the multitude of different dressings I have tried on the left 1 and then subsequently the right. 07/12/15; no change in the condition of this wound. I am able to do an aggressive debridement on the right but not the left. She simply cannot tolerate it. We have been using Iodoflex which helps somewhat. It is worthwhile remembering that at one point we healed the right medial ankle wound and the left was about 25% of the current circumference. We have suggested returning to vascular surgery for review of possible further ablations for one reason or another she has not been able to do this. 07/26/15 no major change in the condition of either wound on her medial ankle. I did not attempt to debridement of these. She has been aggressively scrubbing these while she is in the shower at home. She has her supply of Iodoflex which seems to have done more for these wounds then anything I have put on recently. 08/09/15 wound area appears larger although not verified by measurements. Using Iodoflex 09/05/2015 -- she was here for avisit today but had significant problems with the wound and I was asked to see her for a physician opinion. I have summarize that this  lady has had surgery on her left lower extremity about 10 years ago where the possible veins stripping was done. She has had an opinion from interventional radiology around November 2016 where no further sclerotherapy was ordered. The patient works 12 hours a day and stands on a concrete floor with work boots and is unable to get the proper compression she requires and cannot elevate her limbs appropriately at any given time. She has recently grown Pseudomonas from her wound culture but has not started her ciprofloxacin which was called in for her. 09/13/15 this continues to be a difficult situation for this patient. At one point I had this wound down to a 1.5 x 1.5" wound on her left leg. This is deteriorated and the right leg has reopened. She now has substantial wounds on her medial calcaneus, malleoli and into her lower leg. One on the left has surface eschar but these are far too painful for me to debridement here. She has a vascular surgery appointment next week to see if anything can be done to help here. I think she has had previous ablations several years ago at [facility]. She has no major edema. She tells me that she did not get product last time [facility]] and went for several days without it. She continues to work in work boots 12 hours a day. She cannot get compression/4-layer under her work boots. 09/20/15 no major change. Periwound edema control was not very good.  Her point with pain and vascular is next Wednesday the 25th 09/28/15; the patient is seen vascular surgery and is apparently scheduled for repeat duplex ultrasounds of her bilateral lower legs next week. 10/05/15; the patient was seen by Dr. Doren Custard of vascular surgery. He feels that she should have arterial insufficiency excluded as cause/contributed to her nonhealing stage she is therefore booked for an arteriogram. She has apparently monophasic signals in the dorsalis pedis pulses. She also of course has known severe chronic  venous insufficiency with previous procedures as noted previously. I had another long discussion with the patient today about her continuing to work 12 hour shifts. I've written her out for 2 months area had concerns about this as her work location is currently undergoing significant turmoil and this may lead to her termination. She is aware of this however I agree with her that she simply cannot continue to stand for 12 hours multiple days a week with the substantial wound areas she has. 10/19/15; the Dr. Doren Custard appointment was largely for an arteriogram which was normal. She does not have an arterial issue. He didn't make a comment about her chronic venous insufficiency for which she has had previous ablations. Presumably it was not felt that anything additional could be done. The patient is now out of work as I prescribed 2 weeks ago. Her wounds look somewhat less aggravated presumably because of this. I felt I would give debridement another try today 10/25/15; no major change in this patient's wounds. We are struggling to get her product that she can afford into her own home through her insurance. 11/01/15; no major change in the patient's wounds. I have been using silver alginate as the most affordable product. I spoke to Dr. Marla Roe last week with her requested take her to the OR for surgical debridement and placement of ACEL. Dr. Marla Roe told me that she would be willing to do this however [facility] will not cover this, fortunately the patient has [place] healthcare of some variant 11/08/15; no major change in the patient's wounds. She has been completely nonviable surface that this but is in too much pain with any attempted debridement are clinic. I have arranged for her to see Dr. Marla Roe ham of plastic surgery and this appointment is on Monday. I am hopeful that they will take her to the OR for debridement, possible ACEL ultimately possible skin graft 11/22/15 no major change in  the patient's wounds over her bilateral medial calcaneus medial malleolus into the lower legs. Surface on these does not look too bad however on the left there is surrounding erythema and tenderness. This may be cellulitis or could him sleepy tinea. 11/29/15; no major changes in the patient's wounds over her bilateral medial malleolus. There is no infection here and I don't think any additional antibiotics are necessary. There is now plan to move forward. She sees Dr. Marla Roe in a week's time for preparation for operative debridement and ACEL placement I believe on 7/12. She then has a follow-up appointment with Dr. Marla Roe on 7/21 12/28/15; the patient returns today having been taken to the [facility] by Dr. Marla Roe 12/12/15 she underwent debridement, intraoperative cultures [which were negative]. She had placement of a wound VAC. Parent really ACEL was not available to be placed. The wound VAC foam apparently adhered to the wound since then she's been using silver alginate, Xeroform under Ace wraps. She still says there is a lot of drainage and a lot of pain 01/31/16; this is  a patient I see monthly. I had referred her to Dr. Marla Roe him of plastic surgery for large wounds on her bilateral medial ankles. She has been to the OR twice once in early July and once in early August. She tells me over the last 3 weeks she has been using the wound VAC with ACEL underneath it. On the right we've simply been using silver alginate. Under Kerlix Coban wraps. 02/28/16; this is a patient I'm currently seeing monthly. She is gone on to have a skin graft over her large venous insufficiency ulcer on the left medial ankle. This was done by Dr. Marla Roe him. The patient is a bit perturbed about why she didn't have one on her right medial ankle wound. She has been using silver alginate to this. 03/06/16; I received a phone call from her plastic surgery Dr. Marla Roe. She expressed some concern about the viability of  the skin graft she did on the left medial ankle wound. Asked me to place Endoform on this. She told me she is not planning to do a subsequent skin graft on the right as the left one did not take very well. I had placed Hydrofera Blue on the right 03/13/16; continue to have a reasonably healthy wound on the right medial ankle. Down to 3 mm in terms of size. There is epithelialization here. The area on the left medial ankle is her skin graft site. I suppose the last week this looks somewhat better. She has an open area inferiorly however in the center there appears to be some viable tissue. There is a lot of surface callus and eschar that will eventually need to come off however none of this looked to be infected. Patient states that the is able to keep the dressing on for several days which is an improvement. 03/20/16 no major change in the circumference of either wound however on the left side the patient was at Dr. Eusebio Friendly office and they did a debridement of left wound. 50% of the wound seems to be epithelialized. I been using Endoform on the left Hydrofera Blue in the right 03/27/16; she arrives today with her wound is not looking as healthy as they did last week. The area on the right clearly has an adherent surface to this a very similar surface on the left. Unfortunately for this patient this is all too familiar problem. Clearly the Endoform is not working and will need to change that today that has some potential to help this surface. She does not tolerate debridement in this clinic very well. She is changing the dressing wants 04/03/16; patient arrives with the wounds looking somewhat better especially on the right. Dr. Migdalia Dk change the dressing to silver alginate when she saw her on Monday and also sold her some compression socks. The usefulness of the latter is really not clear and woman with severely draining wounds. 04/10/16; the patient is doing a bit of an experiment wearing the  compression stockings that Dr. Migdalia Dk provided her to her left leg and the out of legs based dressings that we provided to the right. 05/01/16; the patient is continuing to wear compression stockings Dr. Migdalia Dk provided her on the left that are apparently silver impregnated. She has been using Iodoflex to the right leg wound. Still a moderate amount of drainage, when she leaves here the wraps only last for 4 days. She has to change the stocking on the left leg every night 05/15/16; she is now using compression stockings bilaterally provided by  Dr. Marla Roe. She is wearing a nonadherent layer over the wounds so really I don't think there is anything specific being done to this now. She has some reduction on the left wound. The right is stable. I think all healing here is being done without a specific dressing 06/09/16; patient arrives here today with not much change in the wound certainly in diameter to large circular wounds over the medial aspect of her ankle bilaterally. Under the light of these services are certainly not viable for healing. There is no evidence of surrounding infection. She is wearing compression stockings with some sort of silver impregnation as prescribed by Dr. Marla Roe. She has a follow-up with her tomorrow. 06/30/16; no major change in the size or condition of her wounds. These are still probably covered with a nonviable surface. She is using only her purchase stockings. She did see Dr. Marla Roe who seemed to want to apply Dakin's solution to this I'm not extreme short what value this would be. I would suggest Iodoflex which she still has at home. 07/28/16; I follow Mrs. Immekus episodically along with Dr. Marla Roe. 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 She has been applying a topical collagen ointment to both wounds with Adaptic. I don't think Dr. Marla Roe is planning to take her back to the OR. 08/19/16;  I follow Mrs. Jeneen Rinks on a monthly basis along with Dr. Marla Roe of plastic surgery. 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 I been following her for a number of years. At one point I was able to get the right medial malleolus wound to heal and had the left medial malleolus down to about half its current size however and I had to send her to plastic surgery for an operative debridement. Since then things have been stable to slightly improve the area on the right is slightly better one in the left about the same although there is much less adherent surface than I'm used to with this patient. She is using some form of liquid collagen gel that Dr. Marla Roe provided a Kerlix cover with the patient's own pressure stockings. She tells me that she has extreme pain in both ankles and along the lateral aspect of both feet. She has been unable to work for some period of time. She is telling me she is retiring at the beginning of April. She sees Dr. Doran Durand of orthopedics next week 09/22/16; patient has not seen Dr. Marla Roe since the last time she is here. I'm not really sure what she is using to the wounds other than bits and pieces of think she had left over including most recently Hydrofera Blue. She is using juxtalite stockings. She is having difficulty with her husband's recent illness "stroke". She is having to transport him to various doctors appointments. Dr. Marla Roe left her the option of a repeat debridement with ACEL however she has not been able to get the time to follow-up on this. She continues to have a fair amount of drainage out of these wounds with certainly precludes leaving dressings on all week 10/13/16; patient has not seen Dr. Marla Roe since she was last in our clinic. I'm not really sure what she is doing with the wounds, we did try to get her [facility] and I think she is actually using this most of the  time. Because of drainage she states she has to change this every second day  although this is an improvement from what she used to do. She went to see Dr. Doran Durand who did not think she had a muscular issue with regards to her feet, he referred her to a neurologist and I think the appointment is sometime in June. I changed her back to Iodoflex which she has used in the past but not recently. 11/03/16; the patient has been using Iodoflex although she ran out of this. Still claims that there is a lot of drainage although the wound does not look like this. No surrounding erythema. She has not been back to see Dr. Marla Roe 11/24/16; the patient has been using Iodoflex again but she ran out of it 2 or 3 days ago. There is no major change in the condition of either one of these wounds in fact they are larger and covered in a thick adherent surface slough/nonviable tissue especially on the left. She does not tolerate mechanical debridement in our clinic. Going back to see Dr. Marla Roe of plastic surgery for an operative debridement would seem reasonable. 12/15/16; the patient has not been back to see Dr. Marla Roe. She is been dealing with a series of illnesses and her husband which of monopolized her time. She is been using Sorbact which we largely supplied. She states the drainage is bad enough that it maximum she can go 2-3 days without changing the dressing 01/12/2017 -- the patient has not been back for about 4 weeks and has not seen Dr. Marla Roe not does she have any appointment pending. 01/23/17; patient has not seen Dr. Marla Roe even though I suggested this previously. She is using Santyl that was suggested last week by Dr. Con Memos this Cost her $16 through her insurance which is indeed surprising 02/12/17; continuing Santyl and the patient is changing this daily. A lot of drainage. She has not been back to see plastic surgery she is using an Ace wrap. Our intake nurse suggested wrap around stockings  which would make a good reasonable alternative 02/26/17; patient is been using Santyl and changing this daily due to drainage. She has not been to see plastic surgery she uses in April Ace wrap to control the edema. She did obtain extremitease stockings but stated that the edema in her leg was to big for these 03/20/17; patient is using Santyl and Anasept. Surfaces looked better today the area on the right is actually measuring a little smaller. She has states she has a lot of pain in her feet and ankles and is asking for a consult to pain control which I'll try to help her with through our case manager. 04/10/17; the patient arrives with better-looking wound surfaces and is slightly smaller wound on the left she is using a combination of Santyl and Anasept. She has an appointment or at least as started in the pain control center associated with [facility] 05/14/17; this is a patient who I followed for a prolonged period of time. She has venous insufficiency ulcers on her bilateral medial ankles. At one point I had this down to a much smaller wound on the left however these reopened and we've never been able to get these to heal. She has been using Santyl and Anasept gel although 2 weeks ago she ran out of the Anasept gel. She has a stable appearance of the wound. She is going to the wound care clinic at [facility]. They wanted do a nerve block/spinal block although she tells me she is reluctant to go forward with that. 05/21/17; this  is a patient I have followed for many years. She has venous insufficiency ulcers on her bilateral medial ankles. Chronic pain and deformity in her ankles as well. She is been to see plastic surgery as well as orthopedics. Using PolyMem AG most recently/Kerramax/ABDs and 2 layer compression. She has managed to keep this on and she is coming in for a nurse check to change the dressing on Tuesdays, we see her on Fridays 06/05/17; really quite a good looking surface  and the area especially on the right medial has contracted in terms of dimensions. Well granulated healthy-looking tissue on both sides. Even with an open curet there is nothing that even feels abnormal here. This is as good as I've seen this in quite some time. We have been using PolyMem AG and bringing her in for a nurse check 06/12/17; really quite good surface on both of these wounds. The right medial has contracted a bit left is not. We've been using PolyMem and AG and she is coming in for a nurse visit 06/19/17; we have been using PolyMem AG and bringing her in for a nurse check. Dimensions of her wounds are not better but the surfaces looked better bilaterally. She complained of bleeding last night and the left wound and increasing pain bilaterally. She states her wound pain is more neuropathic than just the wounds. There was some suggestion that this was radicular from her pain management doctor in talking to her it is really difficult to sort this out. 06/26/17; using PolyMem and AG and bringing her in for a nurse check as All of this and reasonably stable condition. Certainly not improved. The dimensions on the lateral part of the right leg look better but not really measuring better. The medial aspect on the left is about the same. 07/03/16; we have been using PolyMen AG and bringing her in for a nurse check to change the dressings as the wounds have drainage which precludes once weekly changing. We are using all secondary absorptive dressings.our intake nurse is brought up the idea of using a wound VAC/snap VAC on the wound to help with the drainage to see if this would result in some contraction. This is not a bad idea. The area on the right medial is actually looking smaller. Both wounds have a reasonable-looking surface. There is no evidence of cellulitis. The edema is well controlled 07/10/17; the patient was denied for a snap VAC by her insurance. The major issue with these wounds continues  to be drainage. We are using wicked PolyMem AG and she is coming in for a nurse visit to change this. The wounds are stable to slightly improved. The surface looks vibrant and the area on the right certainly has shrunk in size but very slowly 07/17/17; the patient still has large wounds on her bilateral medial malleoli. Surface of both of these wounds looks better. The dimensions seem to come and go but no consistent improvement. There is no epithelialization. We do not have options for advanced treatment products due to insurance issues. They did not approve of the wound VAC to help control the drainage. More recently we've been using PolyMem and AG wicked to allow drainage through. We have been bringing her in for a nurse visit to change this. We do not have a lot of options for wound care products and the home again due to insurance issues 07/24/17; the patient's wound actually looks somewhat better today. No drainage measurements are smaller still healthy-looking surface. We used silver  collagen under PolyMen started last week. We have been bringing her in for a dressing change 07/31/17; patient's wound surface continued to look better and I think there is visible change in the dimensions of the wound on the right. Rims of epithelialization. We have been using silver collagen under PolyMen and bringing her in for a dressing change. There appears to be less drainage although she is still in need of the dressing change 08/07/17. Patient's wound surface continues to look better on both sides and the area on the right is definitely smaller. We have been using silver collagen and PolyMen. She feels that the drainage has been it has been better. I asked her about her vascular status. She went to see Dr. Aleda Grana at [facility] and had some form of ablation. I don't have much detail on this. I haven't my notes from 2016 that she was not a candidate for any further ablation but I don't have any more  information on this. We had referred her to vein and vascular I don't think she ever went. He does not have a history of PAD although I don't have any information on this either. We don't even have ABIs in our record 08/14/17; we've been using silver collagen and PolyMen cover. And putting the patient and compression. She we are bringing her in as a nurse visit to change this because ofarge amount of drainage. We didn't the ABIs in clinic today since they had been done in many moons 1.2 bilaterally. She has been to see vein and vascular however this was at [facility] and she had ablation although I really don't have any information on this all seemed biking get a report. She is also been operatively debrided by plastic surgery and had a cell placed probably 8-12 months ago. This didn't have a major effect. We've been making some gains with current dressings 08/19/17-She is here in follow-up evaluation for bilateral medial malleoli ulcers. She continues to tolerate debridement very poorly. We will continue with recently changed topical treatment; if no significant improvement may consider switching to Iodosorb/Iodoflex. 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 She has been using silver collagen and PolyMem. I believe she has been used and tried on Iodoflex before. 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 These have been very refractory over time. We have been using silver collagen and PolyMen. She is coming in once a week for a doctor's and once a week for nurse visits. We are actually making some progress 09/18/17; the patient's wounds are smaller especially on the right medial. She arrives today to upset to consider even washing these off with Anasept which  I think is been part of the reason this is been closing. We've been using collagen covered in PolyMen otherwise. It is noted that she has a small area of folliculitis on the right medial calf that. As we are wrapping her legs I'll give her a short course of doxycycline to make sure this doesn't amount to anything. She is a long list of complaints today including imbalance, shortness of breath on exertion, inversion of her left ankle. With regards to the latter complaints she is been to see orthopedics and they offered her a tendon release surgery I believe but wanted her wounds to be  closed first. I have recommended she go see her primary physician with regards to everything else. 09/25/17; patient's wounds are about the same size. We have made some progress bilaterally although not in recent weeks. She will not allow me T wash these wounds with Anasept even if she is doing her cell. Wheeze we've been using collagen covered in PolyMen. Last week she had a small area of folliculitis this is now opened into a small wound. She completed 5 days of trimethoprim sulfamethoxazole 10/02/17; unfortunately the area on her left medial ankle is worse with a larger wound area towards the Achilles. The patient complains of a lot of pain. She will not allow debridement although visually I don't think there is anything to debridement in any case. We have been using silver collagen and PolyMen for several months now. Initially we are making some progress although I'm not really seeing that today. We will move back to [facility]. His admittedly this is a bit of a repeat however I'm hoping that his situation is different now. The patient tells me she had her leg on the left give out on her yesterday this is process some pain. 10/09/17; the patient is seen twice a week largely because of drainage issues coming out of the chronic medial bimalleolar wounds that are chronic. Last week the dimensions of the one on the left  looks a little larger I changed her to [facility]. She comes in today with a history of terrible pain in the bilateral wound areas. She will not allow debridement. She will not even allow a tissue culture. There is no surrounding erythema no no evidence of cellulitis. We have been putting her Kerlix Coban man. She will not allow more aggressive compression as there was a suggestion to put her in 3 layer wraps. 10/16/17; large wounds on her bilateral medial malleoli. These are chronic. Not much change from last week. The surface looks have healthy but absolutely no epithelialization. A lot of pain little less so of drainage. She will not allow debridement or even washing these off in the vigorous fashion with Anasept. 10/23/17; large wounds on her bilateral malleoli which are chronic. Some improvement in terms of size perhaps on the right since last time I saw these. She states that after we increased the 3 layer compression there was some bleeding, when she came in for a nurse visit she did not want 3 layer compression put back on about our nurse managed to convince her. She has known chronic venous visit issues and I'm hoping to get her to tolerate the 3 layer compression. using Hydrofera Blue 10/30/17; absolutely no change in the condition of either wound although we've had some improvement in dimensions on the right.. Attempted to put her in 3 layer compression she didn't tolerated she is back in 2 layer compression. We've been using Hydrofera Blue We looked over her past records. She had venous reflux studies in November 2016. There was no evidence of deep venous reflux on the right. Superficial vein did not show the greater saphenous vein at think this is been previously ablated the small saphenous vein was within normal limits. The left deep venous system showed no DVT the vessels were positive for deep venous reflux in the posterior tibial veins at the ankle. The greater saphenous vein was  surgically absent small saphenous vein was within normal limits. She went to vein and vascular at [facility]. I believe she had an ablation on the left greater saphenous  vein. I'll update her reflux studies perhaps ever reviewed by vein and vascular. We've made absolutely no progress in these wounds. Will also try to read and TheraSkins through her insurance 11/06/17; W the patient apparently has a 2 week follow-up with vein and vascular I like him to review the whole issue with regards to her previous vascular workup by Dr. Aleda Grana. We've really made no progress on these wounds in many months. She arrives today with less viable looking surface on the left medial ankle wound. This was apparently looking about the same on Tuesday when she was here for nurse visit. 11/13/17; deep tissue culture I did last time of the left lower leg showed multiple organisms without any predominating. In particular no Staphylococcus or group A strep were isolated. We sent her for venous reflux studies. She's had a previous left greater saphenous vein stripping and I think sclerotherapy of the right greater saphenous vein. She didn't really look at the lesser saphenous vein this both wounds are on the medial aspect. She has reflux in the common femoral vein and popliteal vein and an accessory vein on the right and the common femoral vein and popliteal vein on the left. I'm going to have her go to see vein and vascular just the look over things and see if anything else beside aggressive compression is indicated here. We have not been able to make any progress on these wounds in spite of the fact that the surface of the wounds is never look too bad. 11/20/17; no major change in the condition of the wounds. Patient reports a large amount of drainage. She has a lot of complaints of pain although enlisting her today I wonder if some of this at least his neuropathic rather than secondary to her wounds. She has an  appointment with vein and vascular on 12/30/17. The refractory nature of these wounds in my mind at least need vein and vascular to look over the wounds the recent reflux studies we did and her history to see if anything further can be done here. I also note her gait is deteriorated quite a bit. 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 She has a bilateral Trendelenburg gait. I wonder if this is neuropathic or perhaps multilevel radicular. 11/27/17; her wounds actually looks slightly better. Healthy-looking granulation tissue a scant amount of epithelialization. [place] healthcare will not pay for [employer]. They will play for tri layer Oasis and Dermagraft. This is not a diabetic ulcer. We'll try for the tri layer Oasis. She still complains of some drainage. She has a vein and vascular appointment on 12/30/17 12/04/17; the wounds visually look quite good. Healthy-looking granulation with some degree of epithelialization. We are still waiting for response to our request for trial to try layer Oasis. Her appointment with vascular to review venous and arterial issues isn't sold the end of July 7/31. Not allow debridement or even vigorous cleansing of the wound surface. 12/18/17; slightly smaller especially on the right. 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 We've been using Hydrofera Blue. We still are looking into triple layer Oasis through her insurance 01/08/18 on evaluation today patient's wound actually appears to be showing signs of good improvement at this point in time. She has been tolerating the dressing changes without complication. Fortunately there does not appear to be any evidence of infection at this point in time. We have been utilizing silver nitrate which does seem to be of benefit for her which is  also good news. Overall I'm very happy with how things seem to be both regards appearance as well as measurement. Patient did see Dr. Bridgett Larsson for  evaluation on 12/30/17. In his assessment he felt that stripping would not likely add much more than chronic compression to the patient's healing process. His recommendation was to follow-up in three months with Dr. Doren Custard if she hasn't healed in order to consider referral back to you and see vascular where she previously was in a trial and was able to get her wound to heal. I'll be see what she feels she when you staying compression and he reiterated this as well. 01/13/18 on evaluation today patient appears to actually be doing very well in regard to her bilateral medial malleolus ulcers. She seems to have tolerated the chemical cauterization with silver nitrate last week she did have some pain through that evening but fortunately states that I'll be see since it seems to be doing better she is overall pleased with the progress. 01/21/18; really quite a remarkable improvement since I've last seen these wounds. We started using silver nitrate specially on the islands of hyper granulation which for some reason her around the wound circumference. This is really done quite nicely. Primary dressing Hydrofera Blue under 4 layer compression. She seems to be able to hold out without a nurse rewrap. Follow-up in 1 week 01/28/18; we've continued the hydrofera blue but continued with chemical cauterization to the wound area that we started about a month ago for irregular hyper granulation. She is made almost stunning improvement in the overall wound dimensions. I was not really expecting this degree of improvement in these chronic wounds 02/05/18; we continue with Hydrofera Bluebut of also continued the aggressive chemical cauterization with silver nitrate. We made nice progress with the right greater than left wound. 02/12/18. We continued with Hydrofera Blue after aggressive chemical cauterization with silver nitrate. We appear to be making nice progress with both wound areas 02/19/2018; we continue with [facility] after washing the wounds vigorously with Anasept spray and chemical cauterization with silver nitrate. We are making excellent progress. The area on the right's just about closed 02/26/2018. The area on the left medial ankle had too much necrotic debris today. I used a #5 curette we are able to get most of the soft. I continued with the silver nitrate to the much smaller wound on the right medial ankle she had a new area on her right lower pretibial area which she says was due to a role in her compression 03/05/2018; both wound areas look healthy. Not much change in dimensions from last week. I continue to use silver nitrate and Hydrofera Blue. The patient saw Dr. Doren Custard of vein and vascular. He felt she had venous stasis ulcers. He felt based on her previous arteriogram she should have adequate circulation for healing. Also she has deep venous reflux but really no significant correctable superficial venous reflux at this time. He felt we should continue with conservative management including leg elevation and compression 04/02/2018; since we last saw this woman about a month ago she had a fall apparently suffered a pelvic fracture. I did not look up the x-ray. Nevertheless because of pain she literally was bedbound for 2 weeks and had home health coming out to change the dressing. Somewhat predictably this is resulted in considerable improvement in both wound areas. The right is just about closed on the medial malleolus and the left is about half the size. 04/16/2018; both her  wounds continue to go down in size. Using Hydrofera Blue. 05/07/18; both her wounds appeared to be improving especially on the right where it is almost closed. We are using Hydrofera Blue 05/14/2018; slightly worse this week with larger wounds. Surface on the left medial not quite as good. We have been using Hydrofera Blue 05/21/18; again the wounds are slightly larger. Left medial malleolus slightly larger with eschar around  the circumference. We have been using Hydrofera Blue undergoing a wraps for a prolonged period of time. This got a lot better when she was more recumbent due to a fall and a back injury. I change the primary dressing the silver alginate today. She did not tolerate a 4 layer compression previously although I may need to bring this up with her next time 05/28/2018; area on the left medial malleolus again is slightly larger with more drainage. Area on the right is roughly unchanged. She has a small area of folliculitis on the right medial just on the lower calf. This does not look ominous. 06/03/2018 left medial malleolus slightly smaller in a better looking surface. We used silver nitrate on this last time with silver alginate. The area on the right appears slightly smaller 1/10; left medial malleolus slightly smaller. Small open area on the right. We used silver nitrate and silver alginate as of 2 weeks ago. We continue with the wound and compression. These got a lot better when she was off her feet 1/17; right medial malleolus wound is smaller. The left may be slightly smaller. Both surfaces look somewhat better. 1/24; both wounds are slightly smaller. Using silver alginate under Unna boots 1/31; both wounds appear smaller in fact the area on the right medial is just about closed. Surface eschar. We have been using silver alginate under Unna boots. The patient is less active now spends let much less time on her feet and I think this is contributed to the general improvement in the wound condition 2/7; both wounds appear smaller. I was hopeful the right medial would be closed however there there is still the same small open area. Slight amount of surface eschar on the left the dimensions are smaller there is eschar but the wound edges appear to be free. We have been using silver alginate under Unna boot's 2/14; both wounds once again measure smaller. Circumferential eschar on the left medial. We have  been using silver alginate under Unna boots with gradual improvement 2/21; the area on the right medial malleolus has healed. The area on the left is smaller. We have been using silver alginate and Unna boots. We can discharge wrapping the right leg she has 20/30 stockings at home she will need to protect the scar tissue in this area 2/28; the area on the right medial malleolus remains closed the patient has a compression stocking. The area on the left is smaller. We have been using silver alginate and Unna boots. 3/6 the area on the right medial ankle remains closed. Good edema control noted she is using her own compression stocking. The area on the left medial ankle is smaller. We have been managing this with silver alginate and Unna boots which we will continue today. 3/13; the area on the right medial ankle remains closed and I'm declaring it healed today. When necessary the left is about the same still a healthy-looking surface but no major change and wound area. No evidence of infection and using silver alginate under unna and generally making considerable improvement 3/27 the area on  the right medial ankle remains closed the area on the left is about the same as last week. Certainly not any worse we have been using silver alginate under an Unna boot 4/3; the area on the right medial ankle remains closed per the patient. We did not look at this wound. The wound on the left medial ankle is about the same surface looks healthy we have been using silver alginate under an Unna boot 4/10; area on the right medial ankle remains closed per the patient. We did not look at this wound. The wound on the left medial ankle is slightly larger. The patient complains that the [facility] caused burning pain all week. She also told us that she was a lot more active this week. Changed her back to silver alginate 4/17; right medial ankle still closed per the patient. Left medial ankle is slightly larger.  Using silver alginate. She did not tolerate Hydrofera Blue on this area 4/24; right medial ankle remains closed we have not look at this. The left medial ankle continues to get larger today by about a centimeter. We have been using silver alginate under Unna boots. She complains about 4 layer compression as an alternative. She has been up on her feet working on her garden 5/8; right medial ankle remains closed we did not look at this. The left medial ankle has increased in size about 100%. We have been using silver alginate under Unna boots. She noted increased pain this week and was not surprised that the wound is deteriorated 5/15; no major change in SA however much less erythema ( one week of doxy ocellulitis). 5/22-65 year old female returns at 1 week to the clinic for left medial ankle wound for which we have been using silver alginate under 3 layer compression She was placed on DOXY at last visit - the wound is wider at this visit. She is in 3 layer compression 5/29; change to [facility] last week. I had given her empiric doxycycline 2 weeks ago for a week. She is in 3 layer compression. She complains of a lot of pain and drainage on presentation today. 6/5; using Hydrofera Blue. I gave her doxycycline recently empirically for erythema and pain around the wound. Believe her cultures showed enterococcus which not would not have been well covered by doxycycline nevertheless the wound looks better and I don't feel specifically that the enterococcus needs to be covered. She has a new what looks like a wrap injury on her lateral left ankle. 6/12; she is using Hydrofera Blue. She has a new area on the left anterior lower tibial area. This was a wrap injury last week. 6/19; the patient is using Hydrofera Blue. She arrived with marked inflammation and erythema around the wound and tenderness. 12/01/18 on evaluation today patient appears to be doing a little bit better based on what I'm hearing from  the standpoint of lassos evaluation to this as far as the overall appearance of the wound is concerned. Then sometime substandard she typically sees Dr. Dellia Nims. Nonetheless overall very pleased with the progress that she's made up to this point. No fevers, chills, nausea, or vomiting noted at this time. 7/10; some improvement in the surface area. Aggressively debrided last week apparently. I went ahead with the debridement today although the patient does not tolerate this very well. We have been using Iodoflex. Still a fair amount of drainage 7/17; slightly smaller. Using Iodoflex. 7/24; no change from last week in terms of surface area. We have  been using Iodoflex. Surface looks and continues to look somewhat better 7/31; surface area slightly smaller better looking surface. We have been using Iodoflex. This is under Unna boot compression 8/7-Patient presents at 1 week with Unna boot and Iodoflex, wound appears better 8/14-Patient presents at 1 week with Iodoflex, we use the Unna boot, wound appears to be stable better.Patient is getting Botox treatment for the inversion of the foot for tendon release, Next week 8/21; we are using Iodoflex. Unna boot. The wound is stable in terms of surface area. Under illumination there is some areas of the wound that appear to be either epithelialized or perhaps this is adherent slough at this point I was not really clear. It did not wipe off and I was reluctant to debride this today. 8/28; we are using Iodoflex in an Unna boot. Seems to be making good improvement. 9/4; using Iodoflex and wound is slightly smaller. 9/18; we are using Iodoflex with topical silver nitrate when she is here. The wound continues to be smaller 10/2; patient missed her appointment last week due to GI issues. She left and Iodoflex based dressing on for 2 weeks. Wound is about the same size about the size of a dime on the left medial lower 10/9 we have been using Iodoflex on the medial  left ankle wound. She has a new superficial probable wrap injury on the dorsal left ankle 10/16; we have been using Hydrofera Blue since last week. This is on the left medial ankle 10/23; we have been using Hydrofera Blue since 2 weeks ago. This is on the left medial ankle. Dimensions are better 11/6; using Hydrofera Blue. I think the wound is smaller but still not closed. Left medial ankle 11/13; we have been using Hydrofera Blue. Wound is certainly no smaller this week. Also the surface not as good. This is the remanent of a very large area on her left medial ankle. 11/20; using Sorbact since last week. Wound was about the same in terms of size although I was disappointed about the surface debris 12/11; 3-week follow-up. Patient was on vacation. Wound is measuring slightly larger we have been using Sorbact. 12/18; wound is about the same size however surface looks better last week after debridement. We have been using Sorbact under compression 1/15 wound is probably twice the size of last time increased in length nonviable surface. We have been using Sorbact. She was running a mild fever and missed her appointment last week 1/22; the wound is come down in size but under illumination still a very adherent debris we have been Hydrofera Blue that I changed her to last week 1/29; dimensions down slightly. We have been using Hydrofera Blue 2/19 dimensions are the same however there is rims of epithelialization under illumination. Therefore more the surface area may be epithelialized 2/26; the patient's wound actually measures smaller. The wound looks healthy. We have been using Hydrofera Blue. I had some thoughts about running Apligraf then I still may do that however this looks so much better this week we will delay that for now 3/5; the wound is small but about the same as last week. We have been using Hydrofera Blue. No debridement is required today. 3/19; the wound is about the size of a dime.  Healthy looking wound even under illumination. We have been using Hydrofera Blue. No mechanical debridement is necessary 3/26; not much change from last week although still looks very healthy. We have been using Hydrofera Blue under Unna boots Patient was  offered an ankle fusion by podiatry but not until the wound heals with a proceed with this. 4/9; the patient comes in today with her original wound on the medial ankle looking satisfactory however she has some uncontrolled swelling in the middle part of her leg with 2 new open areas superiorly just lateral to the tibia. I think this was probably a wrap issue. She said she felt uncomfortable during the week but did not call in. We have been using Hydrofera Blue 4/16; the wound on the medial ankle is about the same. She has innumerable small areas superior to this across her mid tibia. I think this is probably folliculitis. She is also been working in the yard doing a lot of sweating 4/30; the patient issue on the upper areas across her mid tibia of all healed. I think this was excessive yard work if I remember. Her wound on the medial ankle is smaller. Some debris on this we have been using Hydrofera Blue under Unna boots 5/7; mid tibia. She has been using Hydrofera Blue under an Unna wrap. She is apparently going for her ankle surgery on June 3 10/28/19-Patient returns to clinic with the ankle wound, we are using Hydrofera Blue under Unna wrap, surgery is scheduled for her left foot for June 3 so she will be back for nurse visit next week READMISSION 01/17/2020 Mrs. Nancarrow is a 65 year old woman we have had in this clinic for a long period of time with severe venous hypertension and refractory wounds on her medial lower legs and ankles bilaterally. 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 When she was off her legs for a  prolonged period example when she fell and suffered a compression fracture things would heal up quite nicely. She is now retired and we managed to heal up the right medial leg wound. The left one was very tiny last time I saw this although still refractory. 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 It got to the point where this was interfering with her walking and she elected to proceed with a ankle arthrodesis to straighten her her ankle and leave her with a functional outcome for mobilization. The patient was referred to Dr. Doren Custard and really this took some time to arrange. Dr. Doren Custard saw her on 12/07/2019. Once again he verified that she had no arterial issues. She had previously had an angiogram several years ago. Follow-up ABIs on the left showed an ABI of 1.12 with triphasic waveforms and a TBI of 0.92. She is felt to have chronic deep venous insufficiency but I do not think it was felt that anything could be done from about this from an ablation point of view. At the time Dr. Doren Custard saw this patient the wounds actually look closed via the pictures in his clinic. The patient finally underwent her surgery on 12/15/2019. This went reasonably well and there was a good anatomic outcome. She developed a small distal wound dehiscence on the lateral part of the surgical wound. However more problematically she is developed recurrence of the wound on the medial left ankle. There are actually 2 wounds here one in the distal lower leg and 1 pretty much at the level of the medial malleolus. It is a more distal area that is more problematic. She  has been using Hydrofera Blue which started on Friday before this she was simply Ace wrapping. There was a culture done that showed Pseudomonas and she is on ciprofloxacin. A recent CNS on 8/11 was negative. The patient reports some pain but I generally think this is improving. She is  using a cam boot completely nonweightbearing using a walker for pivot transfers and a wheelchair 8/24; not much improvement unfortunately she has a surgical wound on the lateral part in the venous insufficiency wound medially. The bottom part of the medial insufficiency wound is still necrotic there is exposed tendon here. We have been using Hydrofera Blue under compression. Her edema control is however better 8/31; patient in for follow-up of his surgical wound on the lateral part of her left leg and chronic venous insufficiency ulcers medially. We put her back in compression last week. She comes in today with a complaint of 3 or 4 days worth of increasing pain. She felt her cam walker was rubbing on the area on the back of her heel. However there is intense erythema seems more likely she has cellulitis. She had 2 cultures done when she was seeing podiatry in the postop. One of them in late July showed Pseudomonas and she received a course of ciprofloxacin the other was negative on 8/11 she is allergic to penicillin with anaphylactoid complaints of hives oral swelling via information in epic 9/9; when I saw this patient last week she had intense anterior erythema around her wound on the right lateral heel and ankle and also into the right medial heel. Some of this was no doubt drainage and her walker boot however I was convinced she had cellulitis. I gave her Levaquin and Bactrim she is finishing up on this now. She is following up with Dr. [name] he saw her yesterday. He is taken her out of the walking boot of course she is still nonweightbearing. Her x-ray was negative for any worrisome features such as soft tissue air etc. Things are a lot better this week. She has home health. We have been using Hydrofera Blue under an [employer] which she put back on yesterday. I did not wrap her last week 9/17; her surrounding skin looks a lot better. In fact the area on the left lateral ankle has just a scant  amount of eschar. The only remaining wound is the large area on the left medial ankle. Probably about 60% of this is healthy granulation at the surface however she has a significant divot distally. This has adherent debris in it. I been using debridement and silver collagen to try and get this area to fill-in although I do not think we have made much progress this week 9/24; the patient's wound on the left medial ankle looks a lot better. The deeper divot area distally still requires debridement but this is cleaning up quite nicely we have been using silver collagen. The patient is complaining of swelling in her foot and is worried that that is contributing to the nonhealing of the ankle wound. She is also complaining of numbness in her anterior toes 10/4; left medial ankle. The small area distally still has a divot with necrotic material that I have been debriding away. This has an undermining area. She is approved for Apligraf. She saw Dr. [name] her surgeon on 10/1. I think he declared himself is satisfied with the condition of things. Still nonweightbearing till the end of the month. We are dealing with the venous insufficiency wounds on the  medial ankle. Her surgical wound is well closed. There is no evidence of infection 10/11; the patient arrived in clinic today with the expectation that we be able to put an Apligraf on this area after debridement however she arrives with a relatively large amount of green drainage on the dressing. The patient states that this started on Friday. She has not been systemically unwell. 10/19; culture I did last week showed both Enterococcus and Pseudomonas. I think this came in separate parts because I stopped her ciprofloxacin I gave her and prescribed her linezolid however now looking at the final culture result this is Pseudomonas which is resistant to quinolones. She has not yet picked up the linezolid apparently phone issues. We are also trying to get a  topical antibiotic out of [place] in [place] they can be applied by home health. She is still having green drainage 10/16; the patient has her topical antibiotic from [facility] in [place]. This is a compounded gel with vancomycin and ciprofloxacin and gentamicin. We are applying this on the wound bed with silver alginate over the top with Unna boot wraps. She arrives in clinic today with a lot less ominous looking drainage although she is only use this topical preparation once the second time today. She sees Dr. [name] her surgeon on Friday she has home health changing the dressing 11/2; still using her compounded topical antibiotic under silver alginate. Surface is cleaning up there is less drainage. We had an Apligraf for her today and I elected to apply it. A light coating of her antibiotic 04/25/2020 upon evaluation today patient appears to be doing well with regard to her ankle ulcer. There is a little bit of slough noted on the surface of the wound I am can have to perform sharp debridement to clear this away today. With that being said other than that fact overall I feel like she is making progress and we do see some new epithelial growth. There is also some improvement in the depth of the wound and that distal portion. There is little bit of slough there as well. 12/7; 2-week follow-up. Apligraf #3. Dimensions are smaller. Closing in especially inferiorly. Still some surface debris. Still using the [facility] topical antibiotic but I told her that I don't think this needs to be renewed 12/21; 2-week follow-up. Apligraf #4 dimensions are smaller. Nice improvement 06/05/2020; 2-week follow-up. The patient's wound on the left medial ankle looks really excellent. Nice granulation. Advancing epithelialization no undermining no evidence of infection. We would have to reapply for another Apligraf but with the condition of this wound I did not feel strongly about it. We used  Hydrofera Blue under the same degree of compression. She follows up with Dr. [name] her surgeon a week Friday 06/13/2020 upon evaluation today patient appears to be doing excellent in regard to her wound. She has been tolerating the dressing changes without complication. Fortunately there is no signs of active infection at this time. No fevers, chills, nausea, vomiting, or diarrhea. She was using Hydrofera Blue last week. 06/20/2020 06/20/2020 on evaluation today patient actually appears to be doing excellent in regard to her wound. This is measuring better and looking much better as well. She has been using the collagen that seems to be doing better for her as well even though the [name] was and is not sticking or feeling as rough on her wound. She did see Dr. [name] on Friday he is very pleased he also stated none of the hardware  has shifted. That is great news 1/27; the patient has a small clean wound all that is remaining. I agree that this is too small to really consider an Apligraf. Under illumination the surface is looking quite good. We have been using collagen although the dimensions are not any better this week 2/2; the patient has a small clean wound on the left medial ankle. Although this left of her substantial original areas. Measurements are smaller. We have been using polymen Ag under an Haematologist. 2/10; small area on the left medial ankle. This looks clean nothing to debride however dimensions are about the same we have been using polymen I think now for 2 weeks 2/17; not much change in surface area. We have been using polymen Ag without any improvement. 3/17; 1 month follow-up. The patient has been using endoform without any improvement in fact I think this looks worse with more depth and more expansion 3/24; no improvement. Perhaps less debris on the surface. We have been using Sorbact for 1 week 4/4; wound measures larger. She has edema in her leg and her foot which she tells  as her wrap came down. We have been using Unna boots. Sorbact of the wound. She has been approved for Apligraf 09/12/2020 upon evaluation today patient appears to be doing well with regard to her wound. We did get the Apligraf reapproved which is great news we have that available for application today. Fortunately there is no signs of infection and overall the patient seems to be doing great. The wound bed is nice and clean. 4/27; patient presents for her second application of Apligraf. She states over the past week she has been on her feet more often due to being outside in her garden. She has noted more swelling to her foot as a result. She denies increased warmth, pain or erythema to the wound site. 10/10/2020 upon evaluation today patient appears to be doing well with regard to her wound which does not appear to be quite as irritated as last week from what I am hearing. With that being said unfortunately she is having issues with some erythema and warmth to touch as well as an increase size. I do believe this likely is infected. 10/17/2020 upon evaluation today patient appears to be doing excellent in regard to her wound this is significantly improved as compared to last week. Fortunately I think that the infection is much better controlled at this point. She did have evidence of both Enterococcus as well as Staphylococcus noted on culture. Enterococcus really would not be helped significantly by the Cipro but the wound is doing so much better I am under the assumption that the Staphylococcus is probably the main organism that is causing the current infection. Nonetheless I think that she is doing excellent as far as that is concerned and I am very pleased in that regard. I would therefore recommend she continue with the Cipro. 10/31/2020 upon evaluation today patient appears to be doing well with regard to her wound. She has been tolerating the dressing changes without complication. Fortunately there  is no signs of active infection and overall I am extremely pleased with where things stand today. No fevers, chills, nausea, vomiting, or diarrhea. With that being said she does have some green drainage coming from the wound and although it looks okay I am a little concerned about the possibility of a continuing infection. Specifically with Pseudomonas. For that reason I will go ahead and send in a prescription for Cipro  for her to be continued. 11/14/2020 upon evaluation today patient appears to be doing very well currently in regard to her wound on her leg. She has been tolerating the dressing changes without complication. Fortunately I feel like the infection is finally under good control here. Unfortunately we do not have the Apligraf for application today although we can definitely order to have it in place for next week. That will be her fifth and final of the current series. Nonetheless I feel like her wound is really doing quite well which is great news. 11/21/2020 upon evaluation today patient appears to be doing well with regard to her wound on the medial ankle. Fortunately I think the infection is under control and I do believe we can go ahead and reapply the Apligraf today. She is in agreement with that plan. There does not appear to be any signs of active infection at this time which is great news. No fevers, chills, nausea, vomiting, or diarrhea. 12/05/2020 upon evaluation today patient's wound bed actually showed signs of good granulation epithelization at this point. There does not appear to be any signs of infection which is great news and overall very pleased with where things stand. Overall the patient seems to be doing fairly well in my opinion with regard to her wound although I do believe she continues to build up a lot of biofilm I think she could benefit from using PuraPly at this point. Electronic Signature(s) Signed: 12/05/2020 11:29:07 AM By: [name] PA-C Entered By: [name] on 12/05/2020 11:29:07 -------------------------------------------------------------------------------- Physical Exam Details Patient Name: Date of Service: [name], [name] 12/05/2020 10:00 A M Medical Record Number: [id number] Patient Account Number: [id number] Date of Birth/Sex: Treating RN: 06-12-1955 (65 y.o. [name] Provider: [name] Other Clinician: Referring Provider: Treating Provider/Extender: [name] in Treatment: 69 Constitutional Well-nourished and well-hydrated in no acute distress. Respiratory normal breathing without difficulty. Psychiatric this patient is able to make decisions and demonstrates good insight into disease process. Alert and Oriented x 3. pleasant and cooperative. Notes Patient's wound bed showed signs of good granulation and epithelization at this time and some areas I did have to perform sharp debridement clear away some of the slough and biofilm on the surface of the wound which she tolerated today without complication. Post debridement the wound bed appears to be doing much better. Electronic Signature(s) Signed: 12/05/2020 11:29:24 AM By: [name] PA-C Entered By: [name] on 12/05/2020 11:29:23 -------------------------------------------------------------------------------- Physician Orders Details Patient Name: Date of Service: [name], [name] 12/05/2020 10:00 A M Medical Record Number: [id number] Patient Account Number: [id number] Date of Birth/Sex: Treating RN: 12/18/1955 (65 y.o. [name] Provider: [name] Other Clinician: Referring Provider: Treating Provider/Extender: [name] in Treatment: 41 Verbal / Phone Orders: No Diagnosis Coding ICD-10 Coding Code Description [id number] Chronic venous hypertension (idiopathic) with ulcer and inflammation of left lower extremity L97.828 Non-pressure chronic  ulcer of other part of left lower leg with other specified severity L97.328 Non-pressure chronic ulcer of left ankle with other specified severity Follow-up Appointments ppointment in 1 week. - puraply next week Return A Bathing/ Shower/ Hygiene May shower with protection but do not get wound dressing(s) wet. Edema Control - Lymphedema / SCD / Other Elevate legs to the level of the heart or above for 30 minutes daily and/or when sitting, a frequency of: - throughout  the day Avoid standing for long periods of time. Exercise regularly Compression stocking or Garment 20-30 mm/Hg pressure to: - right leg daily Wound Treatment Wound #15 - Malleolus Wound Laterality: Left, Medial Cleanser: Soap and Water 1 x Per Week/30 Days Discharge Instructions: May shower and wash wound with dial antibacterial soap and water prior to dressing change. Peri-Wound Care: Triamcinolone 15 (g) 1 x Per Week/30 Days Discharge Instructions: Use triamcinolone 15 (g) mixed with lotion Peri-Wound Care: Sween Lotion (Moisturizing lotion) 1 x Per Week/30 Days Discharge Instructions: Apply moisturizing lotion as directed Prim Dressing: KerraCel Ag Gelling Fiber Dressing, 2x2 in (silver alginate) 1 x Per Week/30 Days ary Discharge Instructions: Apply silver alginate to wound bed as instructed Secondary Dressing: Woven Gauze Sponge, Non-Sterile 4x4 in 1 x Per Week/30 Days Discharge Instructions: Apply over primary dressing as directed. Secondary Dressing: ABD Pad, 5x9 1 x Per Week/30 Days Discharge Instructions: Apply over primary dressing as directed. Compression Wrap: Unnaboot w/Calamine, 4x10 (in/yd) 1 x Per Week/30 Days Discharge Instructions: Apply Unnaboot as directed. Patient Medications llergies: penicillin, doxycycline A Notifications Medication Indication Start End prior to debridement 12/05/2020 lidocaine DOSE 0 - topical 5 % ointment - 0 ointment topical Electronic Signature(s) Signed: 12/05/2020 4:43:01 PM  By: [name] PA-C Signed: 12/05/2020 6:17:15 PM By: Baruch Gouty RN, BSN Entered By: Baruch Gouty on 12/05/2020 11:14:20 -------------------------------------------------------------------------------- Problem List Details Patient Name: Date of Service: [name], [name] 12/05/2020 10:00 A M Medical Record Number: [id number] Patient Account Number: [id number] Date of Birth/Sex: Treating RN: [birth year] (65 y.o. 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 12/05/2020 10:00 A M Medical Record Number: [id number] Patient Account Number: [id number] Date of Birth/Sex: Treating RN: [birth year] (65 y.o. [name] Provider: [name] Other Clinician: Referring Provider: Treating Provider/Extender: [name] in Treatment: 27 Subjective Chief  Complaint Information obtained from Patient patient is been followed long-term in this clinic for venous insufficiency ulcers with inflammation, hypertension and ulceration over the medial ankle bilaterally. 01/17/2020; this is a patient who is here for review of postoperative wounds on the left lateral ankle and recurrence of venous stasis ulceration on the left medial History of Present Illness (HPI) the remaining wound is over the left medial ankle. Similar wound over the right medial ankle healed largely with use of Apligraf. Most recently we have been using Hydrofera Blue over this wound with considerable improvement. The patient has been extensively worked up in the past for her venous insufficiency and she is not a candidate for antireflux surgery although I have none of the details available currently. 08/24/14; considerable improvement today. About 50% of this wound  areas now epithelialized. The base of the wound appears to be healthier granulation.as opposed to last week when she had deteriorated a considerable improvement 08/17/14; unfortunately the wound has regressed somewhat. The areas of epithelialization from the superior aspect are not nearly as healthy as they were last week. The patient thinks her Hydrofera Blue slipped. 09/07/14; unfortunately the area has markedly regressed in the 2 weeks since I've seen this. There is an odor surrounding erythema. The healthy granulation tissue that we had at the base of the wound now is a dusky color. The nurse reports green drainage 09/14/14; the area looks somewhat better than last week. There is less erythema and less drainage. The culture I did did not show any growth. Nevertheless I think it is better to continue the Cipro and doxycycline for a further week. The remaining wound area was debridement. 09/21/14. Wound did not require debridement last week. Still less erythema and less drainage. She can complete her antibiotics. The areas of  epithelialization in the superior aspect of the wound do not look as healthy as they did some weeks ago 10/05/14 continued improvement in the condition of this wound. There is advancing epithelialization. Less aggressive debridement required 10/19/14 continued improvement in the condition and volume of this wound. Less aggressive debridement to the inferior part of this to remove surface slough and fibrinous eschar 11/02/14 no debridement is required. The surface granulation appears healthy although some of her islands of epithelialization seem to have regressed. No evidence of infection 11/16/14; lites surface debridement done of surface eschar. The wound does not look to be unhealthy. No evidence of infection. Unfortunately the patient has had podiatry issues in the right foot and for some reason has redeveloped small surface ulcerations in the medial right ankle. Her original presentation involved wounds in this area 11/23/14 no debridement. The area on the right ankle has enlarged. The left ankle wound appears stable in terms of the surface although there is periwound inflammation. There has been regression in the amount of new skin 11/30/14 no debridement. Both wound areas appear healthy. There was no evidence of infection. The the new area on the right medial ankle has enlarged although that both the surfaces appear to be stable. 12/07/14; Debridement of the right medial ankle wound. No no debridement was done on the left. 12/14/14 no major change in and now bilateral medial ankle wounds. Both of these are very painful but the no overt evidence of infection. She has had previous venous ablation 12/21/14; patient states that her right medial ankle wound is considerably more painful last week than usual. Her left is also somewhat painful. She could not tolerate debridement. The right medial ankle wound has fibrinous surface eschar 12/28/14 this is a patient with severe bilateral venous insufficiency  ulcers. For a considerable period of time we actually had the one on the right medial ankle healed however this recently opened up again in June. The left medial ankle wound has been a refractory area with some absent flows. We had some success with Hydrofera Blue on this area and it literally closed by 50% however it is recently opened up Foley. Both of these were debridement today of surface eschar. She tolerates this poorly 01/25/15: No change in the status of this. Thick adherent escar. Very poor tolerance of any attempt at debridement. I had healed the right medial malleolus wound for a considerable amount of time and had the left one down to about 50% of the volume although this is  totally regressed over the last 48 weeks. Further the right leg has reopened. she is trying to make a appointment with pain and vascular, previous ablations with Dr. Aleda Grana. I do not believe there is an arterial insufficiency issue here 02/01/15 the status of the adherent eschar bilaterally is actually improved. No debridement was done. She did not manage to get vascular studies done 02/08/15 continued debridement of the area was done today. The slough is less adherent and comes off with less pressure. There is no surrounding infection peripheral pulses are intact 02/15/15 selective debridement with a disposable curette. Again the slough is less adherent and comes off with less difficulty. No surrounding infection peripheral pulses are intact. 02/22/15 selective debridement of the right medial ankle wound. Slough comes off with less difficulty. No obvious surrounding infection peripheral pulses are intact I did not debridement the one on the left. Both of these are stable to improved 03/01/15 selective debridement of both wound areas using a curette to. Adherent slough cup soft with less difficulty. No obvious surrounding infection. The patient tells me that 2 days ago she noted a rash above the right leg wrap. She did  not have this on her lower legs when she change this over she arrives with widespread left greater than right almost folliculitis-looking rash which is extremely pruritic. I don't see anything to culture here. There is no rash on the rest of her body. She feels well systemically. 03/08/15; selective debridement of both wounds using a curette. Base of this does not look unhealthy. She had limegreen drainage coming out of the left leg wound and describes a lot of drainage. The rash on her left leg looks improved to. No cultures were done. 03/22/15; patient was not here last week. Basal wounds does not look healthy and there is no surrounding erythema. No drainage. There is still a rash on the left leg that almost looks vasculitic however it is clearly limited to the top of where the wrap would be. 04/05/15; on the right required a surgical debridement of surface eschar and necrotic subcutaneous tissue. I did not debridement the area on the left. These continue to be large open wounds that are not changing that much. We were successful at one point in healing the area on the right, and at the same time the area on the left was roughly half the size of current measurements. I think a lot of the deterioration has to do with the prolonged time the patient is on her feet at work 04/19/15 I attempted-like surface debridement bilaterally she does not tolerate this. She tells me that she was in allergic care yesterday with extreme pain over her left lateral malleolus/ankle and was told that she has an "sprain" 05/03/15; large bilateral venous insufficiency wounds over the medial malleolus/medial aspect of her ankles. She complains of copious amounts of drainage and his usual large amounts of pain. There is some increasing erythema around the wound on the right extending into the medial aspect of her foot to. historically she came in with these wounds the right one healed and the left one came down to roughly half  its current size however the right one is reopened and the left is expanded. 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 05/10/15 large bilateral venous insufficiency wounds. There is less adherence surface left however the surface culture that I did last week grew pseudomonas therefore bilateral selective debridement  score necessary. There is surrounding erythema. The patient describes severe bilateral drainage and a lot of pain in the left ankle. Apparently her podiatrist was were ready to do a cortisone shot 05/17/15; the patient complains of pain and again copious amounts of drainage. 05/24/15; we used Iodo flex last week. Patient notes considerable improvement in wound drainage. Only needed to change this once. 05/31/15; we continued Iodoflex; the base of these large wounds bilaterally is not too bad but there is probably likely a significant bioburden here. I would like to debridement just doesn't tolerate it. 06/06/14 I would like to continue the Iodoflex although she still hasn't managed to obtain supplies. She has bilateral medial malleoli or large wounds which are mostly superficial. Both of them are covered circumferentially with some nonviable fibrinous slough although she tolerates debridement very poorly. She apparently has an appointment for an ablation on the right leg by interventional radiology. 06/14/15; the patient arrives with the wounds and static condition. We attempted a debridement although she does not do well with this secondary to pain. I 07/05/15; wounds are not much smaller however there appears to be a cleaner granulating base. The left has tight fibrinous slough greater than the right. Debridement is tolerated poorly due to pain. Iodoflex is done more for these wounds in any of the multitude of different dressings I have tried on the left 1 and then subsequently the right. 07/12/15; no change in the condition of this wound. I  am able to do an aggressive debridement on the right but not the left. She simply cannot tolerate it. We have been using Iodoflex which helps somewhat. It is worthwhile remembering that at one point we healed the right medial ankle wound and the left was about 25% of the current circumference. We have suggested returning to vascular surgery for review of possible further ablations for one reason or another she has not been able to do this. 07/26/15 no major change in the condition of either wound on her medial ankle. I did not attempt to debridement of these. She has been aggressively scrubbing these while she is in the shower at home. She has her supply of Iodoflex which seems to have done more for these wounds then anything I have put on recently. 08/09/15 wound area appears larger although not verified by measurements. Using Iodoflex 09/05/2015 -- she was here for avisit today but had significant problems with the wound and I was asked to see her for a physician opinion. I have summarize that this lady has had surgery on her left lower extremity about 10 years ago where the possible veins stripping was done. She has had an opinion from interventional radiology around November 2016 where no further sclerotherapy was ordered. The patient works 12 hours a day and stands on a concrete floor with work boots and is unable to get the proper compression she requires and cannot elevate her limbs appropriately at any given time. She has recently grown Pseudomonas from her wound culture but has not started her ciprofloxacin which was called in for her. 09/13/15 this continues to be a difficult situation for this patient. At one point I had this wound down to a 1.5 x 1.5" wound on her left leg. This is deteriorated and the right leg has reopened. She now has substantial wounds on her medial calcaneus, malleoli and into her lower leg. One on the left has surface eschar but these are far too painful for me to  debridement here. She has  a vascular surgery appointment next week to see if anything can be done to help here. I think she has had previous ablations several years ago at [facility]. She has no major edema. She tells me that she did not get product last time [facility]] and went for several days without it. She continues to work in work boots 12 hours a day. She cannot get compression/4-layer under her work boots. 09/20/15 no major change. Periwound edema control was not very good. Her point with pain and vascular is next Wednesday the 25th 09/28/15; the patient is seen vascular surgery and is apparently scheduled for repeat duplex ultrasounds of her bilateral lower legs next week. 10/05/15; the patient was seen by Dr. Doren Custard of vascular surgery. He feels that she should have arterial insufficiency excluded as cause/contributed to her nonhealing stage she is therefore booked for an arteriogram. She has apparently monophasic signals in the dorsalis pedis pulses. She also of course has known severe chronic venous insufficiency with previous procedures as noted previously. I had another long discussion with the patient today about her continuing to work 12 hour shifts. I've written her out for 2 months area had concerns about this as her work location is currently undergoing significant turmoil and this may lead to her termination. She is aware of this however I agree with her that she simply cannot continue to stand for 12 hours multiple days a week with the substantial wound areas she has. 10/19/15; the Dr. Doren Custard appointment was largely for an arteriogram which was normal. She does not have an arterial issue. He didn't make a comment about her chronic venous insufficiency for which she has had previous ablations. Presumably it was not felt that anything additional could be done. The patient is now out of work as I prescribed 2 weeks ago. Her wounds look somewhat less aggravated presumably because of this.  I felt I would give debridement another try today 10/25/15; no major change in this patient's wounds. We are struggling to get her product that she can afford into her own home through her insurance. 11/01/15; no major change in the patient's wounds. I have been using silver alginate as the most affordable product. I spoke to Dr. Marla Roe last week with her requested take her to the OR for surgical debridement and placement of ACEL. Dr. Marla Roe told me that she would be willing to do this however [facility] will not cover this, fortunately the patient has [place] healthcare of some variant 11/08/15; no major change in the patient's wounds. She has been completely nonviable surface that this but is in too much pain with any attempted debridement are clinic. I have arranged for her to see Dr. Marla Roe ham of plastic surgery and this appointment is on Monday. I am hopeful that they will take her to the OR for debridement, possible ACEL ultimately possible skin graft 11/22/15 no major change in the patient's wounds over her bilateral medial calcaneus medial malleolus into the lower legs. Surface on these does not look too bad however on the left there is surrounding erythema and tenderness. This may be cellulitis or could him sleepy tinea. 11/29/15; no major changes in the patient's wounds over her bilateral medial malleolus. There is no infection here and I don't think any additional antibiotics are necessary. There is now plan to move forward. She sees Dr. Marla Roe in a week's time for preparation for operative debridement and ACEL placement I believe on 7/12. She then has a  follow-up appointment with Dr. Marla Roe on 7/21 12/28/15; the patient returns today having been taken to the [facility] by Dr. Marla Roe 12/12/15 she underwent debridement, intraoperative cultures [which were negative]. She had placement of a wound VAC. Parent really ACEL was not available to be placed. The wound VAC foam  apparently adhered to the wound since then she's been using silver alginate, Xeroform under Ace wraps. She still says there is a lot of drainage and a lot of pain 01/31/16; this is a patient I see monthly. I had referred her to Dr. Marla Roe him of plastic surgery for large wounds on her bilateral medial ankles. She has been to the OR twice once in early July and once in early August. She tells me over the last 3 weeks she has been using the wound VAC with ACEL underneath it. On the right we've simply been using silver alginate. Under Kerlix Coban wraps. 02/28/16; this is a patient I'm currently seeing monthly. She is gone on to have a skin graft over her large venous insufficiency ulcer on the left medial ankle. This was done by Dr. Marla Roe him. The patient is a bit perturbed about why she didn't have one on her right medial ankle wound. She has been using silver alginate to this. 03/06/16; I received a phone call from her plastic surgery Dr. Marla Roe. She expressed some concern about the viability of the skin graft she did on the left medial ankle wound. Asked me to place Endoform on this. She told me she is not planning to do a subsequent skin graft on the right as the left one did not take very well. I had placed Hydrofera Blue on the right 03/13/16; continue to have a reasonably healthy wound on the right medial ankle. Down to 3 mm in terms of size. There is epithelialization here. The area on the left medial ankle is her skin graft site. I suppose the last week this looks somewhat better. She has an open area inferiorly however in the center there appears to be some viable tissue. There is a lot of surface callus and eschar that will eventually need to come off however none of this looked to be infected. Patient states that the is able to keep the dressing on for several days which is an improvement. 03/20/16 no major change in the circumference of either wound however on the left side the  patient was at Dr. Eusebio Friendly office and they did a debridement of left wound. 50% of the wound seems to be epithelialized. I been using Endoform on the left Hydrofera Blue in the right 03/27/16; she arrives today with her wound is not looking as healthy as they did last week. The area on the right clearly has an adherent surface to this a very similar surface on the left. Unfortunately for this patient this is all too familiar problem. Clearly the Endoform is not working and will need to change that today that has some potential to help this surface. She does not tolerate debridement in this clinic very well. She is changing the dressing wants 04/03/16; patient arrives with the wounds looking somewhat better especially on the right. Dr. Migdalia Dk change the dressing to silver alginate when she saw her on Monday and also sold her some compression socks. The usefulness of the latter is really not clear and woman with severely draining wounds. 04/10/16; the patient is doing a bit of an experiment wearing the compression stockings that Dr. Migdalia Dk provided her to her left leg  and the out of legs based dressings that we provided to the right. 05/01/16; the patient is continuing to wear compression stockings Dr. Migdalia Dk provided her on the left that are apparently silver impregnated. She has been using Iodoflex to the right leg wound. Still a moderate amount of drainage, when she leaves here the wraps only last for 4 days. She has to change the stocking on the left leg every night 05/15/16; she is now using compression stockings bilaterally provided by Dr. Marla Roe. She is wearing a nonadherent layer over the wounds so really I don't think there is anything specific being done to this now. She has some reduction on the left wound. The right is stable. I think all healing here is being done without a specific dressing 06/09/16; patient arrives here today with not much change in the wound certainly in diameter to  large circular wounds over the medial aspect of her ankle bilaterally. Under the light of these services are certainly not viable for healing. There is no evidence of surrounding infection. She is wearing compression stockings with some sort of silver impregnation as prescribed by Dr. Marla Roe. She has a follow-up with her tomorrow. 06/30/16; no major change in the size or condition of her wounds. These are still probably covered with a nonviable surface. She is using only her purchase stockings. She did see Dr. Marla Roe who seemed to want to apply Dakin's solution to this I'm not extreme short what value this would be. I would suggest Iodoflex which she still has at home. 07/28/16; I follow Mrs. Kibe episodically along with Dr. Marla Roe. 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 She has been applying a topical collagen ointment to both wounds with Adaptic. I don't think Dr. Marla Roe is planning to take her back to the OR. 08/19/16; I follow Mrs. Jeneen Rinks on a monthly basis along with Dr. Marla Roe of plastic surgery. 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 I been following her for a number of years. At one point I was able to get the right medial malleolus wound to heal and had the left medial malleolus down to about half its current size however and I had to send her to plastic surgery for an operative debridement. Since then things have been stable to slightly improve the area on the right is slightly better one in the left about the same although there is much less adherent surface than I'm used to with this patient. She is using some form of liquid collagen gel that Dr. Marla Roe provided a Kerlix cover with the patient's own pressure stockings. She tells me that she has extreme pain in both ankles and along the lateral aspect of both feet. She has been  unable to work for some period of time. She is telling me she is retiring at the beginning of April. She sees Dr. Doran Durand of orthopedics next week 09/22/16; patient has not seen Dr. Marla Roe since the last time she is here. I'm not really sure what she is using to the wounds other than bits and pieces of think she had left over including most recently Hydrofera Blue. She is using juxtalite stockings. She is having difficulty with her husband's recent illness "stroke". She is having to transport him to various doctors appointments. Dr. Marla Roe left her the option of a repeat debridement with ACEL however she has not been able to get the time  to follow-up on this. She continues to have a fair amount of drainage out of these wounds with certainly precludes leaving dressings on all week 10/13/16; patient has not seen Dr. Marla Roe since she was last in our clinic. I'm not really sure what she is doing with the wounds, we did try to get her [facility] and I think she is actually using this most of the time. Because of drainage she states she has to change this every second day although this is an improvement from what she used to do. She went to see Dr. Doran Durand who did not think she had a muscular issue with regards to her feet, he referred her to a neurologist and I think the appointment is sometime in June. I changed her back to Iodoflex which she has used in the past but not recently. 11/03/16; the patient has been using Iodoflex although she ran out of this. Still claims that there is a lot of drainage although the wound does not look like this. No surrounding erythema. She has not been back to see Dr. Marla Roe 11/24/16; the patient has been using Iodoflex again but she ran out of it 2 or 3 days ago. There is no major change in the condition of either one of these wounds in fact they are larger and covered in a thick adherent surface slough/nonviable tissue especially on the left. She does not  tolerate mechanical debridement in our clinic. Going back to see Dr. Marla Roe of plastic surgery for an operative debridement would seem reasonable. 12/15/16; the patient has not been back to see Dr. Marla Roe. She is been dealing with a series of illnesses and her husband which of monopolized her time. She is been using Sorbact which we largely supplied. She states the drainage is bad enough that it maximum she can go 2-3 days without changing the dressing 01/12/2017 -- the patient has not been back for about 4 weeks and has not seen Dr. Marla Roe not does she have any appointment pending. 01/23/17; patient has not seen Dr. Marla Roe even though I suggested this previously. She is using Santyl that was suggested last week by Dr. Con Memos this Cost her $16 through her insurance which is indeed surprising 02/12/17; continuing Santyl and the patient is changing this daily. A lot of drainage. She has not been back to see plastic surgery she is using an Ace wrap. Our intake nurse suggested wrap around stockings which would make a good reasonable alternative 02/26/17; patient is been using Santyl and changing this daily due to drainage. She has not been to see plastic surgery she uses in April Ace wrap to control the edema. She did obtain extremitease stockings but stated that the edema in her leg was to big for these 03/20/17; patient is using Santyl and Anasept. Surfaces looked better today the area on the right is actually measuring a little smaller. She has states she has a lot of pain in her feet and ankles and is asking for a consult to pain control which I'll try to help her with through our case manager. 04/10/17; the patient arrives with better-looking wound surfaces and is slightly smaller wound on the left she is using a combination of Santyl and Anasept. She has an appointment or at least as started in the pain control center associated with [facility] 05/14/17; this is a patient who I  followed for a prolonged period of time. She has venous insufficiency ulcers on her bilateral medial ankles. At one point  I had this down to a much smaller wound on the left however these reopened and we've never been able to get these to heal. She has been using Santyl and Anasept gel although 2 weeks ago she ran out of the Anasept gel. She has a stable appearance of the wound. She is going to the wound care clinic at [facility]. They wanted do a nerve block/spinal block although she tells me she is reluctant to go forward with that. 05/21/17; this is a patient I have followed for many years. She has venous insufficiency ulcers on her bilateral medial ankles. Chronic pain and deformity in her ankles as well. She is been to see plastic surgery as well as orthopedics. Using PolyMem AG most recently/Kerramax/ABDs and 2 layer compression. She has managed to keep this on and she is coming in for a nurse check to change the dressing on Tuesdays, we see her on Fridays 06/05/17; really quite a good looking surface and the area especially on the right medial has contracted in terms of dimensions. Well granulated healthy-looking tissue on both sides. Even with an open curet there is nothing that even feels abnormal here. This is as good as I've seen this in quite some time. We have been using PolyMem AG and bringing her in for a nurse check 06/12/17; really quite good surface on both of these wounds. The right medial has contracted a bit left is not. We've been using PolyMem and AG and she is coming in for a nurse visit 06/19/17; we have been using PolyMem AG and bringing her in for a nurse check. Dimensions of her wounds are not better but the surfaces looked better bilaterally. She complained of bleeding last night and the left wound and increasing pain bilaterally. She states her wound pain is more neuropathic than just the wounds. There was some suggestion that this was radicular from her pain management  doctor in talking to her it is really difficult to sort this out. 06/26/17; using PolyMem and AG and bringing her in for a nurse check as All of this and reasonably stable condition. Certainly not improved. The dimensions on the lateral part of the right leg look better but not really measuring better. The medial aspect on the left is about the same. 07/03/16; we have been using PolyMen AG and bringing her in for a nurse check to change the dressings as the wounds have drainage which precludes once weekly changing. We are using all secondary absorptive dressings.our intake nurse is brought up the idea of using a wound VAC/snap VAC on the wound to help with the drainage to see if this would result in some contraction. This is not a bad idea. The area on the right medial is actually looking smaller. Both wounds have a reasonable-looking surface. There is no evidence of cellulitis. The edema is well controlled 07/10/17; the patient was denied for a snap VAC by her insurance. The major issue with these wounds continues to be drainage. We are using wicked PolyMem AG and she is coming in for a nurse visit to change this. The wounds are stable to slightly improved. The surface looks vibrant and the area on the right certainly has shrunk in size but very slowly 07/17/17; the patient still has large wounds on her bilateral medial malleoli. Surface of both of these wounds looks better. The dimensions seem to come and go but no consistent improvement. There is no epithelialization. We do not have options for advanced treatment products  due to insurance issues. They did not approve of the wound VAC to help control the drainage. More recently we've been using PolyMem and AG wicked to allow drainage through. We have been bringing her in for a nurse visit to change this. We do not have a lot of options for wound care products and the home again due to insurance issues 07/24/17; the patient's wound actually looks somewhat  better today. No drainage measurements are smaller still healthy-looking surface. We used silver collagen under PolyMen started last week. We have been bringing her in for a dressing change 07/31/17; patient's wound surface continued to look better and I think there is visible change in the dimensions of the wound on the right. Rims of epithelialization. We have been using silver collagen under PolyMen and bringing her in for a dressing change. There appears to be less drainage although she is still in need of the dressing change 08/07/17. Patient's wound surface continues to look better on both sides and the area on the right is definitely smaller. We have been using silver collagen and PolyMen. She feels that the drainage has been it has been better. I asked her about her vascular status. She went to see Dr. Aleda Grana at [facility] and had some form of ablation. I don't have much detail on this. I haven't my notes from 2016 that she was not a candidate for any further ablation but I don't have any more information on this. We had referred her to vein and vascular I don't think she ever went. He does not have a history of PAD although I don't have any information on this either. We don't even have ABIs in our record 08/14/17; we've been using silver collagen and PolyMen cover. And putting the patient and compression. She we are bringing her in as a nurse visit to change this because ofarge amount of drainage. We didn't the ABIs in clinic today since they had been done in many moons 1.2 bilaterally. She has been to see vein and vascular however this was at [facility] and she had ablation although I really don't have any information on this all seemed biking get a report. She is also been operatively debrided by plastic surgery and had a cell placed probably 8-12 months ago. This didn't have a major effect. We've been making some gains with current dressings 08/19/17-She is here in follow-up  evaluation for bilateral medial malleoli ulcers. She continues to tolerate debridement very poorly. We will continue with recently changed topical treatment; if no significant improvement may consider switching to Iodosorb/Iodoflex. 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 She has been using silver collagen and PolyMem. I believe she has been used and tried on Iodoflex before. 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 These have been very refractory over time. We have been using silver collagen and PolyMen. She is coming in once a week for a doctor's and once a week for nurse visits. We are actually making some progress 09/18/17; the patient's wounds are smaller especially on the right medial. She arrives today to upset to consider even washing these off with Anasept which I think is been part of the reason this is been closing. We've been using collagen covered in PolyMen otherwise. It is noted that  she has a small area of folliculitis on the right medial calf that. As we are wrapping her legs I'll give her a short course of doxycycline to make sure this doesn't amount to anything. She is a long list of complaints today including imbalance, shortness of breath on exertion, inversion of her left ankle. With regards to the latter complaints she is been to see orthopedics and they offered her a tendon release surgery I believe but wanted her wounds to be closed first. I have recommended she go see her primary physician with regards to everything else. 09/25/17; patient's wounds are about the same size. We have made some progress bilaterally although not in recent weeks. She will not allow me T wash these wounds with Anasept even if she is doing her cell. Wheeze  we've been using collagen covered in PolyMen. Last week she had a small area of folliculitis this is now opened into a small wound. She completed 5 days of trimethoprim sulfamethoxazole 10/02/17; unfortunately the area on her left medial ankle is worse with a larger wound area towards the Achilles. The patient complains of a lot of pain. She will not allow debridement although visually I don't think there is anything to debridement in any case. We have been using silver collagen and PolyMen for several months now. Initially we are making some progress although I'm not really seeing that today. We will move back to [facility]. His admittedly this is a bit of a repeat however I'm hoping that his situation is different now. The patient tells me she had her leg on the left give out on her yesterday this is process some pain. 10/09/17; the patient is seen twice a week largely because of drainage issues coming out of the chronic medial bimalleolar wounds that are chronic. Last week the dimensions of the one on the left looks a little larger I changed her to [facility]. She comes in today with a history of terrible pain in the bilateral wound areas. She will not allow debridement. She will not even allow a tissue culture. There is no surrounding erythema no no evidence of cellulitis. We have been putting her Kerlix Coban man. She will not allow more aggressive compression as there was a suggestion to put her in 3 layer wraps. 10/16/17; large wounds on her bilateral medial malleoli. These are chronic. Not much change from last week. The surface looks have healthy but absolutely no epithelialization. A lot of pain little less so of drainage. She will not allow debridement or even washing these off in the vigorous fashion with Anasept. 10/23/17; large wounds on her bilateral malleoli which are chronic. Some improvement in terms of size perhaps on the right since last time I saw these. She states that after we  increased the 3 layer compression there was some bleeding, when she came in for a nurse visit she did not want 3 layer compression put back on about our nurse managed to convince her. She has known chronic venous visit issues and I'm hoping to get her to tolerate the 3 layer compression. using Hydrofera Blue 10/30/17; absolutely no change in the condition of either wound although we've had some improvement in dimensions on the right.. Attempted to put her in 3 layer compression she didn't tolerated she is back in 2 layer compression. We've been using Hydrofera Blue We looked over her past records. She had venous reflux studies in November 2016. There was no evidence of deep venous reflux on the  right. Superficial vein did not show the greater saphenous vein at think this is been previously ablated the small saphenous vein was within normal limits. The left deep venous system showed no DVT the vessels were positive for deep venous reflux in the posterior tibial veins at the ankle. The greater saphenous vein was surgically absent small saphenous vein was within normal limits. She went to vein and vascular at [facility]. I believe she had an ablation on the left greater saphenous vein. I'll update her reflux studies perhaps ever reviewed by vein and vascular. We've made absolutely no progress in these wounds. Will also try to read and TheraSkins through her insurance 11/06/17; W the patient apparently has a 2 week follow-up with vein and vascular I like him to review the whole issue with regards to her previous vascular workup by Dr. Aleda Grana. We've really made no progress on these wounds in many months. She arrives today with less viable looking surface on the left medial ankle wound. This was apparently looking about the same on Tuesday when she was here for nurse visit. 11/13/17; deep tissue culture I did last time of the left lower leg showed multiple organisms without any predominating. In  particular no Staphylococcus or group A strep were isolated. We sent her for venous reflux studies. She's had a previous left greater saphenous vein stripping and I think sclerotherapy of the right greater saphenous vein. She didn't really look at the lesser saphenous vein this both wounds are on the medial aspect. She has reflux in the common femoral vein and popliteal vein and an accessory vein on the right and the common femoral vein and popliteal vein on the left. I'm going to have her go to see vein and vascular just the look over things and see if anything else beside aggressive compression is indicated here. We have not been able to make any progress on these wounds in spite of the fact that the surface of the wounds is never look too bad. 11/20/17; no major change in the condition of the wounds. Patient reports a large amount of drainage. She has a lot of complaints of pain although enlisting her today I wonder if some of this at least his neuropathic rather than secondary to her wounds. She has an appointment with vein and vascular on 12/30/17. The refractory nature of these wounds in my mind at least need vein and vascular to look over the wounds the recent reflux studies we did and her history to see if anything further can be done here. I also note her gait is deteriorated quite a bit. 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 She has a bilateral Trendelenburg gait. I wonder if this is neuropathic or perhaps multilevel radicular. 11/27/17; her wounds actually looks slightly better. Healthy-looking granulation tissue a scant amount of epithelialization. [place] healthcare will not pay for [employer]. They will play for tri layer Oasis and Dermagraft. This is not a diabetic ulcer. We'll try for the tri layer Oasis. She still complains of some drainage. She has a vein and vascular appointment on 12/30/17 12/04/17; the wounds visually look quite good. Healthy-looking granulation with some  degree of epithelialization. We are still waiting for response to our request for trial to try layer Oasis. Her appointment with vascular to review venous and arterial issues isn't sold the end of July 7/31. Not allow debridement or even vigorous cleansing of the wound surface. 12/18/17; slightly smaller especially on the right. Both wounds  have epithelialization superiorly some hyper granulation. We've been using Hydrofera Blue. We still are looking into triple layer Oasis through her insurance 01/08/18 on evaluation today patient's wound actually appears to be showing signs of good improvement at this point in time. She has been tolerating the dressing changes without complication. Fortunately there does not appear to be any evidence of infection at this point in time. We have been utilizing silver nitrate which does seem to be of benefit for her which is also good news. Overall I'm very happy with how things seem to be both regards appearance as well as measurement. Patient did see Dr. Bridgett Larsson for evaluation on 12/30/17. In his assessment he felt that stripping would not likely add much more than chronic compression to the patient's healing process. His recommendation was to follow-up in three months with Dr. Doren Custard if she hasn't healed in order to consider referral back to you and see vascular where she previously was in a trial and was able to get her wound to heal. I'll be see what she feels she when you staying compression and he reiterated this as well. 01/13/18 on evaluation today patient appears to actually be doing very well in regard to her bilateral medial malleolus ulcers. She seems to have tolerated the chemical cauterization with silver nitrate last week she did have some pain through that evening but fortunately states that I'll be see since it seems to be doing better she is overall pleased with the progress. 01/21/18; really quite a remarkable improvement since I've last seen these wounds.  We started using silver nitrate specially on the islands of hyper granulation which for some reason her around the wound circumference. This is really done quite nicely. Primary dressing Hydrofera Blue under 4 layer compression. She seems to be able to hold out without a nurse rewrap. Follow-up in 1 week 01/28/18; we've continued the hydrofera blue but continued with chemical cauterization to the wound area that we started about a month ago for irregular hyper granulation. She is made almost stunning improvement in the overall wound dimensions. I was not really expecting this degree of improvement in these chronic wounds 02/05/18; we continue with Hydrofera Bluebut of also continued the aggressive chemical cauterization with silver nitrate. We made nice progress with the right greater than left wound. 02/12/18. We continued with Hydrofera Blue after aggressive chemical cauterization with silver nitrate. We appear to be making nice progress with both wound areas 02/19/2018; we continue with [facility] after washing the wounds vigorously with Anasept spray and chemical cauterization with silver nitrate. We are making excellent progress. The area on the right's just about closed 02/26/2018. The area on the left medial ankle had too much necrotic debris today. I used a #5 curette we are able to get most of the soft. I continued with the silver nitrate to the much smaller wound on the right medial ankle she had a new area on her right lower pretibial area which she says was due to a role in her compression 03/05/2018; both wound areas look healthy. Not much change in dimensions from last week. I continue to use silver nitrate and Hydrofera Blue. The patient saw Dr. Doren Custard of vein and vascular. He felt she had venous stasis ulcers. He felt based on her previous arteriogram she should have adequate circulation for healing. Also she has deep venous reflux but really no significant correctable superficial venous  reflux at this time. He felt we should continue with conservative management including leg  elevation and compression 04/02/2018; since we last saw this woman about a month ago she had a fall apparently suffered a pelvic fracture. I did not look up the x-ray. Nevertheless because of pain she literally was bedbound for 2 weeks and had home health coming out to change the dressing. Somewhat predictably this is resulted in considerable improvement in both wound areas. The right is just about closed on the medial malleolus and the left is about half the size. 04/16/2018; both her wounds continue to go down in size. Using Hydrofera Blue. 05/07/18; both her wounds appeared to be improving especially on the right where it is almost closed. We are using Hydrofera Blue 05/14/2018; slightly worse this week with larger wounds. Surface on the left medial not quite as good. We have been using Hydrofera Blue 05/21/18; again the wounds are slightly larger. Left medial malleolus slightly larger with eschar around the circumference. We have been using Hydrofera Blue undergoing a wraps for a prolonged period of time. This got a lot better when she was more recumbent due to a fall and a back injury. I change the primary dressing the silver alginate today. She did not tolerate a 4 layer compression previously although I may need to bring this up with her next time 05/28/2018; area on the left medial malleolus again is slightly larger with more drainage. Area on the right is roughly unchanged. She has a small area of folliculitis on the right medial just on the lower calf. This does not look ominous. 06/03/2018 left medial malleolus slightly smaller in a better looking surface. We used silver nitrate on this last time with silver alginate. The area on the right appears slightly smaller 1/10; left medial malleolus slightly smaller. Small open area on the right. We used silver nitrate and silver alginate as of 2 weeks ago. We  continue with the wound and compression. These got a lot better when she was off her feet 1/17; right medial malleolus wound is smaller. The left may be slightly smaller. Both surfaces look somewhat better. 1/24; both wounds are slightly smaller. Using silver alginate under Unna boots 1/31; both wounds appear smaller in fact the area on the right medial is just about closed. Surface eschar. We have been using silver alginate under Unna boots. The patient is less active now spends let much less time on her feet and I think this is contributed to the general improvement in the wound condition 2/7; both wounds appear smaller. I was hopeful the right medial would be closed however there there is still the same small open area. Slight amount of surface eschar on the left the dimensions are smaller there is eschar but the wound edges appear to be free. We have been using silver alginate under Unna boot's 2/14; both wounds once again measure smaller. Circumferential eschar on the left medial. We have been using silver alginate under Unna boots with gradual improvement 2/21; the area on the right medial malleolus has healed. The area on the left is smaller. We have been using silver alginate and Unna boots. We can discharge wrapping the right leg she has 20/30 stockings at home she will need to protect the scar tissue in this area 2/28; the area on the right medial malleolus remains closed the patient has a compression stocking. The area on the left is smaller. We have been using silver alginate and Unna boots. 3/6 the area on the right medial ankle remains closed. Good edema control noted she is using  her own compression stocking. The area on the left medial ankle is smaller. We have been managing this with silver alginate and Unna boots which we will continue today. 3/13; the area on the right medial ankle remains closed and I'm declaring it healed today. When necessary the left is about the same still a  healthy-looking surface but no major change and wound area. No evidence of infection and using silver alginate under unna and generally making considerable improvement 3/27 the area on the right medial ankle remains closed the area on the left is about the same as last week. Certainly not any worse we have been using silver alginate under an Unna boot 4/3; the area on the right medial ankle remains closed per the patient. We did not look at this wound. The wound on the left medial ankle is about the same surface looks healthy we have been using silver alginate under an Unna boot 4/10; area on the right medial ankle remains closed per the patient. We did not look at this wound. The wound on the left medial ankle is slightly larger. The patient complains that the [facility] caused burning pain all week. She also told us that she was a lot more active this week. Changed her back to silver alginate 4/17; right medial ankle still closed per the patient. Left medial ankle is slightly larger. Using silver alginate. She did not tolerate Hydrofera Blue on this area 4/24; right medial ankle remains closed we have not look at this. The left medial ankle continues to get larger today by about a centimeter. We have been using silver alginate under Unna boots. She complains about 4 layer compression as an alternative. She has been up on her feet working on her garden 5/8; right medial ankle remains closed we did not look at this. The left medial ankle has increased in size about 100%. We have been using silver alginate under Unna boots. She noted increased pain this week and was not surprised that the wound is deteriorated 5/15; no major change in SA however much less erythema ( one week of doxy ocellulitis). 5/22-65 year old female returns at 1 week to the clinic for left medial ankle wound for which we have been using silver alginate under 3 layer compression She was placed on DOXY at last visit - the wound  is wider at this visit. She is in 3 layer compression 5/29; change to [facility] last week. I had given her empiric doxycycline 2 weeks ago for a week. She is in 3 layer compression. She complains of a lot of pain and drainage on presentation today. 6/5; using Hydrofera Blue. I gave her doxycycline recently empirically for erythema and pain around the wound. Believe her cultures showed enterococcus which not would not have been well covered by doxycycline nevertheless the wound looks better and I don't feel specifically that the enterococcus needs to be covered. She has a new what looks like a wrap injury on her lateral left ankle. 6/12; she is using Hydrofera Blue. She has a new area on the left anterior lower tibial area. This was a wrap injury last week. 6/19; the patient is using Hydrofera Blue. She arrived with marked inflammation and erythema around the wound and tenderness. 12/01/18 on evaluation today patient appears to be doing a little bit better based on what I'm hearing from the standpoint of lassos evaluation to this as far as the overall appearance of the wound is concerned. Then sometime substandard she typically sees  Dr. Dellia Nims. Nonetheless overall very pleased with the progress that she's made up to this point. No fevers, chills, nausea, or vomiting noted at this time. 7/10; some improvement in the surface area. Aggressively debrided last week apparently. I went ahead with the debridement today although the patient does not tolerate this very well. We have been using Iodoflex. Still a fair amount of drainage 7/17; slightly smaller. Using Iodoflex. 7/24; no change from last week in terms of surface area. We have been using Iodoflex. Surface looks and continues to look somewhat better 7/31; surface area slightly smaller better looking surface. We have been using Iodoflex. This is under Unna boot compression 8/7-Patient presents at 1 week with Unna boot and Iodoflex, wound appears  better 8/14-Patient presents at 1 week with Iodoflex, we use the Unna boot, wound appears to be stable better.Patient is getting Botox treatment for the inversion of the foot for tendon release, Next week 8/21; we are using Iodoflex. Unna boot. The wound is stable in terms of surface area. Under illumination there is some areas of the wound that appear to be either epithelialized or perhaps this is adherent slough at this point I was not really clear. It did not wipe off and I was reluctant to debride this today. 8/28; we are using Iodoflex in an Unna boot. Seems to be making good improvement. 9/4; using Iodoflex and wound is slightly smaller. 9/18; we are using Iodoflex with topical silver nitrate when she is here. The wound continues to be smaller 10/2; patient missed her appointment last week due to GI issues. She left and Iodoflex based dressing on for 2 weeks. Wound is about the same size about the size of a dime on the left medial lower 10/9 we have been using Iodoflex on the medial left ankle wound. She has a new superficial probable wrap injury on the dorsal left ankle 10/16; we have been using Hydrofera Blue since last week. This is on the left medial ankle 10/23; we have been using Hydrofera Blue since 2 weeks ago. This is on the left medial ankle. Dimensions are better 11/6; using Hydrofera Blue. I think the wound is smaller but still not closed. Left medial ankle 11/13; we have been using Hydrofera Blue. Wound is certainly no smaller this week. Also the surface not as good. This is the remanent of a very large area on her left medial ankle. 11/20; using Sorbact since last week. Wound was about the same in terms of size although I was disappointed about the surface debris 12/11; 3-week follow-up. Patient was on vacation. Wound is measuring slightly larger we have been using Sorbact. 12/18; wound is about the same size however surface looks better last week after debridement. We have been  using Sorbact under compression 1/15 wound is probably twice the size of last time increased in length nonviable surface. We have been using Sorbact. She was running a mild fever and missed her appointment last week 1/22; the wound is come down in size but under illumination still a very adherent debris we have been Hydrofera Blue that I changed her to last week 1/29; dimensions down slightly. We have been using Hydrofera Blue 2/19 dimensions are the same however there is rims of epithelialization under illumination. Therefore more the surface area may be epithelialized 2/26; the patient's wound actually measures smaller. The wound looks healthy. We have been using Hydrofera Blue. I had some thoughts about running Apligraf then I still may do that however this looks so  much better this week we will delay that for now 3/5; the wound is small but about the same as last week. We have been using Hydrofera Blue. No debridement is required today. 3/19; the wound is about the size of a dime. Healthy looking wound even under illumination. We have been using Hydrofera Blue. No mechanical debridement is necessary 3/26; not much change from last week although still looks very healthy. We have been using Hydrofera Blue under Unna boots Patient was offered an ankle fusion by podiatry but not until the wound heals with a proceed with this. 4/9; the patient comes in today with her original wound on the medial ankle looking satisfactory however she has some uncontrolled swelling in the middle part of her leg with 2 new open areas superiorly just lateral to the tibia. I think this was probably a wrap issue. She said she felt uncomfortable during the week but did not call in. We have been using Hydrofera Blue 4/16; the wound on the medial ankle is about the same. She has innumerable small areas superior to this across her mid tibia. I think this is probably folliculitis. She is also been working in the yard doing a  lot of sweating 4/30; the patient issue on the upper areas across her mid tibia of all healed. I think this was excessive yard work if I remember. Her wound on the medial ankle is smaller. Some debris on this we have been using Hydrofera Blue under Unna boots 5/7; mid tibia. She has been using Hydrofera Blue under an Unna wrap. She is apparently going for her ankle surgery on June 3 10/28/19-Patient returns to clinic with the ankle wound, we are using Hydrofera Blue under Unna wrap, surgery is scheduled for her left foot for June 3 so she will be back for nurse visit next week READMISSION 01/17/2020 Mrs. Renslow is a 65 year old woman we have had in this clinic for a long period of time with severe venous hypertension and refractory wounds on her medial lower legs and ankles bilaterally. 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 When she was off her legs for a prolonged period example when she fell and suffered a compression fracture things would heal up quite nicely. She is now retired and we managed to heal up the right medial leg wound. The left one was very tiny last time I saw this although still refractory. 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 It got to the point where this was interfering with her walking and she elected to proceed with a ankle arthrodesis to straighten her her ankle and leave her with a functional outcome for mobilization. The patient was referred to Dr. Doren Custard and really this took some time to arrange. Dr. Doren Custard saw her on 12/07/2019. Once again he verified that she had no arterial issues. She had previously had an angiogram several years ago. Follow-up ABIs on the left showed an ABI of 1.12 with triphasic waveforms and a TBI of 0.92. She is felt to have chronic  deep venous insufficiency but I do not think it was felt that anything could be done from about this from an ablation point of view. At the time Dr. Doren Custard saw this patient the wounds actually look closed via the pictures in his  clinic. The patient finally underwent her surgery on 12/15/2019. This went reasonably well and there was a good anatomic outcome. She developed a small distal wound dehiscence on the lateral part of the surgical wound. However more problematically she is developed recurrence of the wound on the medial left ankle. There are actually 2 wounds here one in the distal lower leg and 1 pretty much at the level of the medial malleolus. It is a more distal area that is more problematic. She has been using Hydrofera Blue which started on Friday before this she was simply Ace wrapping. There was a culture done that showed Pseudomonas and she is on ciprofloxacin. A recent CNS on 8/11 was negative. The patient reports some pain but I generally think this is improving. She is using a cam boot completely nonweightbearing using a walker for pivot transfers and a wheelchair 8/24; not much improvement unfortunately she has a surgical wound on the lateral part in the venous insufficiency wound medially. The bottom part of the medial insufficiency wound is still necrotic there is exposed tendon here. We have been using Hydrofera Blue under compression. Her edema control is however better 8/31; patient in for follow-up of his surgical wound on the lateral part of her left leg and chronic venous insufficiency ulcers medially. We put her back in compression last week. She comes in today with a complaint of 3 or 4 days worth of increasing pain. She felt her cam walker was rubbing on the area on the back of her heel. However there is intense erythema seems more likely she has cellulitis. She had 2 cultures done when she was seeing podiatry in the postop. One of them in late July showed Pseudomonas and  she received a course of ciprofloxacin the other was negative on 8/11 she is allergic to penicillin with anaphylactoid complaints of hives oral swelling via information in epic 9/9; when I saw this patient last week she had intense anterior erythema around her wound on the right lateral heel and ankle and also into the right medial heel. Some of this was no doubt drainage and her walker boot however I was convinced she had cellulitis. I gave her Levaquin and Bactrim she is finishing up on this now. She is following up with Dr. [name] he saw her yesterday. He is taken her out of the walking boot of course she is still nonweightbearing. Her x-ray was negative for any worrisome features such as soft tissue air etc. Things are a lot better this week. She has home health. We have been using Hydrofera Blue under an [employer] which she put back on yesterday. I did not wrap her last week 9/17; her surrounding skin looks a lot better. In fact the area on the left lateral ankle has just a scant amount of eschar. The only remaining wound is the large area on the left medial ankle. Probably about 60% of this is healthy granulation at the surface however she has a significant divot distally. This has adherent debris in it. I been using debridement and silver collagen to try and get this area to fill-in although I do not think we have made much progress this week 9/24; the patient's wound on the left medial ankle looks a lot better. The deeper divot area distally still requires debridement but this is cleaning up quite nicely we have been using silver collagen. The patient is complaining of swelling in her foot and is worried that that is contributing to the nonhealing  of the ankle wound. She is also complaining of numbness in her anterior toes 10/4; left medial ankle. The small area distally still has a divot with necrotic material that I have been debriding away. This has an undermining area. She is approved for  Apligraf. She saw Dr. [name] her surgeon on 10/1. I think he declared himself is satisfied with the condition of things. Still nonweightbearing till the end of the month. 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 Her surgical wound is well closed. There is no evidence of infection 10/11; the patient arrived in clinic today with the expectation that we be able to put an Apligraf on this area after debridement however she arrives with a relatively large amount of green drainage on the dressing. The patient states that this started on Friday. She has not been systemically unwell. 10/19; culture I did last week showed both Enterococcus and Pseudomonas. I think this came in separate parts because I stopped her ciprofloxacin I gave her and prescribed her linezolid however now looking at the final culture result this is Pseudomonas which is resistant to quinolones. She has not yet picked up the linezolid apparently phone issues. We are also trying to get a topical antibiotic out of [name] in [place] they can be applied by home health. She is still having green drainage 10/16; the patient has her topical antibiotic from [facility] in [place]. This is a compounded gel with vancomycin and ciprofloxacin and gentamicin. We are applying this on the wound bed with silver alginate over the top with Unna boot wraps. She arrives in clinic today with a lot less ominous looking drainage although she is only use this topical preparation once the second time today. She sees Dr. [name] her surgeon on Friday she has home health changing the dressing 11/2; still using her compounded topical antibiotic under silver alginate. Surface is cleaning up there is less drainage. We had an Apligraf for her today and I elected to apply it. A light coating of her antibiotic 04/25/2020 upon evaluation today patient appears to be doing well with regard to her ankle ulcer. There is a little  bit of slough noted on the surface of the wound I am can have to perform sharp debridement to clear this away today. With that being said other than that fact overall I feel like she is making progress and we do see some new epithelial growth. There is also some improvement in the depth of the wound and that distal portion. There is little bit of slough there as well. 12/7; 2-week follow-up. Apligraf #3. Dimensions are smaller. Closing in especially inferiorly. Still some surface debris. Still using the [facility] topical antibiotic but I told her that I don't think this needs to be renewed 12/21; 2-week follow-up. Apligraf #4 dimensions are smaller. Nice improvement 06/05/2020; 2-week follow-up. The patient's wound on the left medial ankle looks really excellent. Nice granulation. Advancing epithelialization no undermining no evidence of infection. We would have to reapply for another Apligraf but with the condition of this wound I did not feel strongly about it. We used Hydrofera Blue under the same degree of compression. She follows up with Dr. [name] her surgeon a week Friday 06/13/2020 upon evaluation today patient appears to be doing excellent in regard to her wound. She has been tolerating the dressing changes without complication. Fortunately there is no signs of active infection at this time. No fevers, chills, nausea, vomiting, or diarrhea. She was  using Hydrofera Blue last week. 06/20/2020 06/20/2020 on evaluation today patient actually appears to be doing excellent in regard to her wound. This is measuring better and looking much better as well. She has been using the collagen that seems to be doing better for her as well even though the [facility] was and is not sticking or feeling as rough on her wound. She did see Dr. [name] on Friday he is very pleased he also stated none of the hardware has shifted. That is great news 1/27; the patient has a small clean wound all that is remaining. I  agree that this is too small to really consider an Apligraf. Under illumination the surface is looking quite good. We have been using collagen although the dimensions are not any better this week 2/2; the patient has a small clean wound on the left medial ankle. Although this left of her substantial original areas. Measurements are smaller. We have been using polymen Ag under an Haematologist. 2/10; small area on the left medial ankle. This looks clean nothing to debride however dimensions are about the same we have been using polymen I think now for 2 weeks 2/17; not much change in surface area. We have been using polymen Ag without any improvement. 3/17; 1 month follow-up. The patient has been using endoform without any improvement in fact I think this looks worse with more depth and more expansion 3/24; no improvement. Perhaps less debris on the surface. We have been using Sorbact for 1 week 4/4; wound measures larger. She has edema in her leg and her foot which she tells as her wrap came down. We have been using Unna boots. Sorbact of the wound. She has been approved for Apligraf 09/12/2020 upon evaluation today patient appears to be doing well with regard to her wound. We did get the Apligraf reapproved which is great news we have that available for application today. Fortunately there is no signs of infection and overall the patient seems to be doing great. The wound bed is nice and clean. 4/27; patient presents for her second application of Apligraf. She states over the past week she has been on her feet more often due to being outside in her garden. She has noted more swelling to her foot as a result. She denies increased warmth, pain or erythema to the wound site. 10/10/2020 upon evaluation today patient appears to be doing well with regard to her wound which does not appear to be quite as irritated as last week from what I am hearing. With that being said unfortunately she is having issues with  some erythema and warmth to touch as well as an increase size. I do believe this likely is infected. 10/17/2020 upon evaluation today patient appears to be doing excellent in regard to her wound this is significantly improved as compared to last week. Fortunately I think that the infection is much better controlled at this point. She did have evidence of both Enterococcus as well as Staphylococcus noted on culture. Enterococcus really would not be helped significantly by the Cipro but the wound is doing so much better I am under the assumption that the Staphylococcus is probably the main organism that is causing the current infection. Nonetheless I think that she is doing excellent as far as that is concerned and I am very pleased in that regard. I would therefore recommend she continue with the Cipro. 10/31/2020 upon evaluation today patient appears to be doing well with regard to her wound.  She has been tolerating the dressing changes without complication. Fortunately there is no signs of active infection and overall I am extremely pleased with where things stand today. No fevers, chills, nausea, vomiting, or diarrhea. With that being said she does have some green drainage coming from the wound and although it looks okay I am a little concerned about the possibility of a continuing infection. Specifically with Pseudomonas. For that reason I will go ahead and send in a prescription for Cipro for her to be continued. 11/14/2020 upon evaluation today patient appears to be doing very well currently in regard to her wound on her leg. She has been tolerating the dressing changes without complication. Fortunately I feel like the infection is finally under good control here. Unfortunately we do not have the Apligraf for application today although we can definitely order to have it in place for next week. That will be her fifth and final of the current series. Nonetheless I feel like her wound is really doing  quite well which is great news. 11/21/2020 upon evaluation today patient appears to be doing well with regard to her wound on the medial ankle. Fortunately I think the infection is under control and I do believe we can go ahead and reapply the Apligraf today. She is in agreement with that plan. There does not appear to be any signs of active infection at this time which is great news. No fevers, chills, nausea, vomiting, or diarrhea. 12/05/2020 upon evaluation today patient's wound bed actually showed signs of good granulation epithelization at this point. There does not appear to be any signs of infection which is great news and overall very pleased with where things stand. Overall the patient seems to be doing fairly well in my opinion with regard to her wound although I do believe she continues to build up a lot of biofilm I think she could benefit from using PuraPly at this point. Objective Constitutional Well-nourished and well-hydrated in no acute distress. Vitals Time Taken: 10:33 AM, Height: 68 in, Temperature: 97.9 F, Pulse: 58 bpm, Respiratory Rate: 20 breaths/min, Blood Pressure: 144/70 mmHg. Respiratory normal breathing without difficulty. Psychiatric this patient is able to make decisions and demonstrates good insight into disease process. Alert and Oriented x 3. pleasant and cooperative. General Notes: Patient's wound bed showed signs of good granulation and epithelization at this time and some areas I did have to perform sharp debridement clear away some of the slough and biofilm on the surface of the wound which she tolerated today without complication. Post debridement the wound bed appears to be doing much better. Integumentary (Hair, Skin) Wound #15 status is Open. Original cause of wound was Gradually Appeared. The date acquired was: 12/30/2019. The wound has been in treatment 46 weeks. The wound is located on the Left,Medial Malleolus. The wound measures 3.2cm length x 2.2cm  width x 0.1cm depth; 5.529cm^2 area and 0.553cm^3 volume. There is Fat Layer (Subcutaneous Tissue) exposed. There is no tunneling or undermining noted. There is a medium amount of purulent drainage noted. The wound margin is distinct with the outline attached to the wound base. There is large (67-100%) red, pink granulation within the wound bed. There is a small (1- 33%) amount of necrotic tissue within the wound bed including Adherent Slough. 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 There was a Excisional Skin/Subcutaneous Tissue Debridement with a total area of 7.04 sq cm performed by [name], PA. With the following instrument(s): Curette to remove Viable and Non-Viable tissue/material. Material removed includes Subcutaneous Tissue and Slough and after achieving pain control using Lidocaine 5% topical ointment. No specimens were taken. A time out was conducted at 11:05, prior to the start of the procedure. A Minimum amount of bleeding was controlled with Pressure. The procedure was tolerated well with a pain level of 4 throughout and a pain level of 2 following the procedure. Post Debridement Measurements: 3.2cm length x 2.2cm width x 0.1cm depth; 0.553cm^3 volume. Character of Wound/Ulcer Post Debridement is improved. Severity of Tissue Post Debridement is: Fat layer exposed. Post procedure Diagnosis Wound #15: Same as Pre-Procedure Pre-procedure diagnosis of Wound #15 is a Venous Leg Ulcer located on the Left,Medial Malleolus . There was a Haematologist Compression Therapy  Procedure by Leane Call, RN. Post procedure Diagnosis Wound #15: Same as Pre-Procedure Plan Follow-up Appointments: Return Appointment in 1 week. - puraply next week Bathing/ Shower/ Hygiene: May shower with protection but do not get wound dressing(s) wet. Edema Control - Lymphedema / SCD / Other: Elevate legs to the level of the heart or above for 30 minutes daily and/or when sitting, a frequency of: - throughout the day Avoid standing for long periods of time. Exercise regularly Compression stocking or Garment 20-30 mm/Hg pressure to: - right leg daily The following medication(s) was prescribed: lidocaine topical 5 % ointment 0 0 ointment topical for prior to debridement was prescribed at facility WOUND #15: - Malleolus Wound Laterality: Left, Medial Cleanser: Soap and Water 1 x Per Week/30 Days Discharge Instructions: May shower and wash wound with dial antibacterial soap and water prior to dressing change. Peri-Wound Care: Triamcinolone 15 (g) 1 x Per Week/30 Days Discharge Instructions: Use triamcinolone 15 (g) mixed with lotion Peri-Wound Care: Sween Lotion (Moisturizing lotion) 1 x Per Week/30 Days Discharge Instructions: Apply moisturizing lotion as directed Prim Dressing: KerraCel Ag Gelling Fiber Dressing, 2x2 in (silver alginate) 1 x Per Week/30 Days ary Discharge Instructions: Apply silver alginate to wound bed as instructed Secondary Dressing: Woven Gauze Sponge, Non-Sterile 4x4 in 1 x Per Week/30 Days Discharge Instructions: Apply over primary dressing as directed. Secondary Dressing: ABD Pad, 5x9 1 x Per Week/30 Days Discharge Instructions: Apply over primary dressing as directed. Com pression Wrap: Unnaboot w/Calamine, 4x10 (in/yd) 1 x Per Week/30 Days Discharge Instructions: Apply Unnaboot as directed. 1. Would recommend currently that we actually use a silver alginate dressing at this point to help with some of the drainage and antimicrobial control. With  that being said I do believe that she could benefit from the antimicrobial PuraPly to try to help control the biofilm buildup at this point as well. This was discussed with the patient today and she is approved for that and therefore I think we can go ahead and see about ordering and using this starting next week if we get it in time. The patient is in agreement with that plan. 2. I am also can recommend that she continue to monitor for any signs of worsening or infection if anything changes she should let me know. Otherwise we will see where things stand at follow-up. We will see patient back for reevaluation in 1  week here in the clinic. If anything worsens or changes patient will contact our office for additional recommendations. Electronic Signature(s) Signed: 12/05/2020 11:30:15 AM By: [name] PA-C Entered By: [name] on 12/05/2020 11:30:15 -------------------------------------------------------------------------------- SuperBill Details Patient Name: Date of Service: [facility] [name], [name] 12/05/2020 Medical Record Number: [id number] Patient Account Number: [id number] Date of Birth/Sex: Treating RN: September 14, 1955 (65 y.o. [name] Provider: [name] Other Clinician: Referring Provider: Treating Provider/Extender: [name] in Treatment: 46 Diagnosis Coding ICD-10 Codes Code Description [phone] Chronic venous hypertension (idiopathic) with ulcer and inflammation of left lower extremity L97.828 Non-pressure chronic ulcer of other part of left lower leg with other specified severity L97.328 Non-pressure chronic ulcer of left ankle with other specified severity Facility Procedures CPT4 Code: [id number] Description: [id number] - DEB SUBQ TISSUE 20 SQ CM/< ICD-10 Diagnosis Description L97.828 Non-pressure chronic ulcer of other part of left lower leg with other specified Modifier: severity Quantity: 1 Physician Procedures :  CPT4 Code Description Modifier [id number] - WC PHYS SUBQ TISS 20 SQ CM ICD-10 Diagnosis Description L97.828 Non-pressure chronic ulcer of other part of left lower leg with other specified severity Quantity: 1 Electronic Signature(s) Signed: 12/05/2020 11:30:35 AM By: [name] PA-C Entered By: [name] on 12/05/2020 11:30:34

## 2020-12-05 NOTE — Progress Notes (Signed)
JOSSALIN, CHERVENAK ([id number]) Visit Report for 12/05/2020 Arrival Information Details Patient Name: Date of Service: [facility], [name] 12/05/2020 10:00 A M Medical Record Number: [id number] Patient Account Number: [id number] Date of Birth/Sex: Treating RN: 14-Feb-1956 (65 y.o. [name] Primary Care Philena Obey: [name] Other Clinician: Referring Quadre Bristol: Treating Maham Quintin/Extender: [name] in Treatment: 35 Visit Information History Since Last Visit Added or deleted any medications: No Patient Arrived: Cane Any new allergies or adverse reactions: No Arrival Time: 10:32 Had a fall or experienced change in No Transfer Assistance: None activities of daily living that may affect Patient Identification Verified: Yes risk of falls: Secondary Verification Process Completed: Yes Signs or symptoms of abuse/neglect since last visito No Patient Requires Transmission-Based Precautions: No Hospitalized since last visit: No Patient Has Alerts: Yes Implantable device outside of the clinic excluding No Patient Alerts: L ABI =1.12, TBI = .92 cellular tissue based products placed in the center R ABI= 1.02, TBI= .58 since last visit: Has Dressing in Place as Prescribed: Yes Has Compression in Place as Prescribed: Yes Pain Present Now: No Electronic Signature(s) Signed: 12/05/2020 5:24:26 PM By: [name] Entered By: [name] on 12/05/2020 10:33:09 -------------------------------------------------------------------------------- Compression Therapy Details Patient Name: Date of Service: [name], [name] 12/05/2020 10:00 A M Medical Record Number: [id number] Patient Account Number: [id number] Date of Birth/Sex: Treating RN: 01-10-1956 (65 y.o. [name] Annalysa Mohammad: [name] Other Clinician: Referring Collen Hostler: Treating Emory Leaver/Extender: [name] in Treatment: 85 Compression Therapy  Performed for Wound Assessment: Wound #15 Left,Medial Malleolus Performed By: Clinician Leane Call, RN Compression Type: Rolena Infante Post Procedure Diagnosis Same as Pre-procedure Electronic Signature(s) Signed: 12/05/2020 6:17:15 PM By: Baruch Gouty RN, BSN Entered By: Baruch Gouty on 12/05/2020 11:11:45 -------------------------------------------------------------------------------- Encounter Discharge Information Details Patient Name: Date of Service: [name], [name] 12/05/2020 10:00 A M Medical Record Number: [id number] Patient Account Number: [id number] Date of Birth/Sex: Treating RN: 15-Nov-1955 (65 y.o. Nita Sells Primary Care Kaleel Schmieder: [name] Other Clinician: Referring Areli Jowett: Treating Emrie Gayle/Extender: [name] in Treatment: 28 Encounter Discharge Information Items Post Procedure Vitals Discharge Condition: Stable Temperature (F): 98.9 Ambulatory Status: Ambulatory Pulse (bpm): 58 Discharge Destination: Home Respiratory Rate (breaths/min): 20 Transportation: Private Auto Blood Pressure (mmHg): 144/70 Accompanied By: alone Schedule Follow-up Appointment: No Clinical Summary of Care: Patient Declined Electronic Signature(s) Signed: 12/05/2020 5:07:06 PM By: Leane Call Entered By: Leane Call on 12/05/2020 16:55:48 -------------------------------------------------------------------------------- Lower Extremity Assessment Details Patient Name: Date of Service: [name], [name] 12/05/2020 10:00 A M Medical Record Number: [id number] Patient Account Number: [id number] Date of Birth/Sex: Treating RN: 19-Mar-1956 (65 y.o. [name] Primary Care Rolin Schult: [name] Other Clinician: Referring Jhair Witherington: Treating Goldy Calandra/Extender: [name] in Treatment: 46 Edema Assessment Assessed: [Left: No] [Right: No] Edema: [Left: Ye] [Right: s] Calf Left: Right: Point of  Measurement: 31 cm From Medial Instep 26.5 cm Ankle Left: Right: Point of Measurement: 11 cm From Medial Instep 22 cm Vascular Assessment Pulses: Dorsalis Pedis Palpable: [Left:Yes] Electronic Signature(s) Signed: 12/05/2020 5:24:26 PM By: [name] Entered By: [name] on 12/05/2020 10:43:41 -------------------------------------------------------------------------------- Multi-Disciplinary Care Plan Details Patient Name: Date of Service: [name], [name] 12/05/2020 10:00 A M Medical Record Number: [id number] Patient Account Number: [id number] Date of Birth/Sex: Treating RN: 1956/02/06 (65 y.o. [name] Fable Huisman: [name] Other Clinician:  Referring Aboubacar Matsuo: Treating Gertude Benito/Extender: [name] in Treatment: [place] reviewed with physician Active Inactive Venous Leg Ulcer Nursing Diagnoses: Actual venous Insuffiency (use after diagnosis is confirmed) Knowledge deficit related to disease process and management Goals: Patient will maintain optimal edema control Date Initiated: 06/20/2020 Target Resolution Date: 01/02/2021 Goal Status: Active Interventions: Assess peripheral edema status every visit. Compression as ordered Treatment Activities: Therapeutic compression applied : 06/20/2020 Notes: Wound/Skin Impairment Nursing Diagnoses: Impaired tissue integrity Knowledge deficit related to ulceration/compromised skin integrity Goals: Patient/caregiver will verbalize understanding of skin care regimen Date Initiated: 01/17/2020 Target Resolution Date: 01/02/2021 Goal Status: Active Ulcer/skin breakdown will have a volume reduction of 30% by week 4 Date Initiated: 01/17/2020 Date Inactivated: 03/12/2020 Target Resolution Date: 03/09/2020 Goal Status: Met Interventions: Assess patient/caregiver ability to obtain necessary supplies Assess patient/caregiver ability to perform ulcer/skin  care regimen upon admission and as needed Assess ulceration(s) every visit Provide education on ulcer and skin care Notes: Electronic Signature(s) Signed: 12/05/2020 6:17:15 PM By: Baruch Gouty RN, BSN Entered By: Baruch Gouty on 12/05/2020 17:00:52 -------------------------------------------------------------------------------- Pain Assessment Details Patient Name: Date of Service: [name], [name] 12/05/2020 10:00 A M Medical Record Number: [id number] Patient Account Number: [id number] Date of Birth/Sex: Treating RN: 1956/01/20 (65 y.o. [name] Primary Care Jahziah Simonin: [name] Other Clinician: Referring Kathe Wirick: Treating Alesandro Stueve/Extender: [name] in Treatment: 8 Active Problems Location of Pain Severity and Description of Pain Patient Has Paino No Site Locations Pain Management and Medication Current Pain Management: Electronic Signature(s) Signed: 12/05/2020 5:24:26 PM By: [name] Entered By: [name] on 12/05/2020 10:36:36 -------------------------------------------------------------------------------- Patient/Caregiver Education Details Patient Name: Date of Service: [name], [name] 7/6/2022andnbsp10:00 A M Medical Record Number: [id number] Patient Account Number: [id number] Date of Birth/Gender: Treating RN: [birth year] (65 y.o. [name] Physician: [name] Other Clinician: Referring Physician: Treating Physician/Extender: [name] in Treatment: 50 Education Assessment Education Provided To: Patient Education Topics Provided Venous: Methods: Explain/Verbal Responses: Reinforcements needed, State content correctly Electronic Signature(s) Signed: 12/05/2020 6:17:15 PM By: Baruch Gouty RN, BSN Entered By: Baruch Gouty on 12/05/2020 17:01:11 -------------------------------------------------------------------------------- Wound Assessment  Details Patient Name: Date of Service: [name], [name] 12/05/2020 10:00 A M Medical Record Number: [id number] Patient Account Number: [id number] Date of Birth/Sex: Treating RN: Jun 21, 1955 (65 y.o. [name] Primary Care Aleira Deiter: [name] Other Clinician: Referring Staci Dack: Treating Kamyrah Feeser/Extender: [name] in Treatment: 46 Wound Status Wound Number: 15 Primary Venous Leg Ulcer Etiology: Wound Location: Left, Medial Malleolus Wound Status: Open Wounding Event: Gradually Appeared Comorbid Peripheral Venous Disease, Osteoarthritis, Confinement Date Acquired: 12/30/2019 History: Anxiety Weeks Of Treatment: 46 Clustered Wound: No Photos Wound Measurements Length: (cm) 3.2 Width: (cm) 2.2 Depth: (cm) 0.1 Area: (cm) 5.529 Volume: (cm) 0.553 % Reduction in Area: 52.6% % Reduction in Volume: 94.1% Epithelialization: Small (1-33%) Tunneling: No Undermining: No Wound Description Classification: Full Thickness With Exposed Support Structures Wound Margin: Distinct, outline attached Exudate Amount: Medium Exudate Type: Purulent Exudate Color: yellow, brown, green Foul Odor After Cleansing: No Slough/Fibrino Yes Wound Bed Granulation Amount: Large (67-100%) Exposed Structure Granulation Quality: Red, Pink Fascia Exposed: No Necrotic Amount: Small (1-33%) Fat Layer (Subcutaneous Tissue) Exposed: Yes Necrotic Quality: Adherent Slough Tendon Exposed: No Muscle Exposed: No Joint Exposed: No Bone Exposed: No Treatment Notes Wound #15 (Malleolus) Wound Laterality: Left, Medial Cleanser Soap and Water Discharge Instruction: May  shower and wash wound with dial antibacterial soap and water prior to dressing change. Peri-Wound Care Triamcinolone 15 (g) Discharge Instruction: Use triamcinolone 15 (g) mixed with lotion Sween Lotion (Moisturizing lotion) Discharge Instruction: Apply moisturizing lotion as directed Topical Primary  Dressing KerraCel Ag Gelling Fiber Dressing, 2x2 in (silver alginate) Discharge Instruction: Apply silver alginate to wound bed as instructed Secondary Dressing Woven Gauze Sponge, Non-Sterile 4x4 in Discharge Instruction: Apply over primary dressing as directed. ABD Pad, 5x9 Discharge Instruction: Apply over primary dressing as directed. Secured With Compression Wrap Unnaboot w/Calamine, 4x10 (in/yd) Discharge Instruction: Apply Unnaboot as directed. Compression Stockings Add-Ons Electronic Signature(s) Signed: 12/05/2020 5:22:20 PM By: [name] Signed: 12/05/2020 5:24:26 PM By: [name] Entered By: [name] on 12/05/2020 17:15:57 -------------------------------------------------------------------------------- [place] Details Patient Name: Date of Service: [name], [name] 12/05/2020 10:00 A M Medical Record Number: [id number] Patient Account Number: [id number] Date of Birth/Sex: Treating RN: 04-21-1956 (65 y.o. [name] Primary Care Alyse Kathan: [name] Other Clinician: Referring Jarrick Fjeld: Treating Naidelin Gugliotta/Extender: [name] in Treatment: 76 Vital Signs Time Taken: 10:33 Temperature (F): 97.9 Height (in): 68 Pulse (bpm): 58 Respiratory Rate (breaths/min): 20 Blood Pressure (mmHg): 144/70 Reference Range: 80 - 120 mg / dl Electronic Signature(s) Signed: 12/05/2020 5:24:26 PM By: [name] Entered By: [name] on 12/05/2020 10:36:28

## 2020-12-12 ENCOUNTER — Other Ambulatory Visit: Payer: Self-pay

## 2020-12-12 ENCOUNTER — Encounter (HOSPITAL_BASED_OUTPATIENT_CLINIC_OR_DEPARTMENT_OTHER): Payer: Medicare Other | Admitting: Physician Assistant

## 2020-12-12 DIAGNOSIS — I87332 Chronic venous hypertension (idiopathic) with ulcer and inflammation of left lower extremity: Secondary | ICD-10-CM | POA: Diagnosis not present

## 2020-12-12 NOTE — Progress Notes (Addendum)
[name], [name] ([id number]) Visit Report for 12/12/2020 Chief Complaint Document Details Patient Name: Date of Service: [facility] [name], [name] 12/12/2020 10:00 A M Medical Record Number: [id number] Patient Account Number: [id number] Date of Birth/Sex: Treating RN: [birth year] (65 y.o. [name] Primary Care Provider: [name] Other Clinician: Referring Provider: Treating Provider/Extender: [name] in Treatment: 28 Information Obtained from: Patient Chief Complaint patient is been followed long-term in this clinic for venous insufficiency ulcers with inflammation, hypertension and ulceration over the medial ankle bilaterally. 01/17/2020; this is a patient who is here for review of postoperative wounds on the left lateral ankle and recurrence of venous stasis ulceration on the left medial Electronic Signature(s) Signed: 12/12/2020 10:13:22 AM By: [name] Keeler PA-C Entered By: [name] on 12/12/2020 10:13:21 -------------------------------------------------------------------------------- Cellular or Tissue Based Product Details Patient Name: Date of Service: [name] [name], [name] 12/12/2020 10:00 A M Medical Record Number: [id number] Patient Account Number: [id number] Date of Birth/Sex: Treating RN: 1955-12-05 (65 y.o. [name] Primary Care Provider: [name] Other Clinician: Referring Provider: Treating Provider/Extender: [name] in Treatment: 93 Cellular or Tissue Based Product Type Wound #15 Left,Medial Malleolus Applied to: Performed By: Physician [name] Keeler, PA Cellular or Tissue Based Product Type: Puraply Level of Consciousness (Pre-procedure): Awake and Alert Pre-procedure Verification/Time Out Yes - 10:48 Taken: Location: trunk / arms / legs Wound Size (sq cm): 10.26 Product Size (sq cm): 12 Waste Size (sq cm): 0 Amount of Product Applied (sq cm): 12 Instrument Used: Forceps,  Scissors Lot #: [id number] Expiration Date: 04/11/2023 Reconstituted: Yes Solution Type: Saline Solution Amount: 5cc Lot #: [id number] Solution Expiration Date: 01/31/2022 Secured: Yes Secured With: Steri-Strips Dressing Applied: Yes Primary Dressing: Adaptic Response to Treatment: Procedure was tolerated well Level of Consciousness (Post- Awake and Alert procedure): Post Procedure Diagnosis Same as Pre-procedure Electronic Signature(s) Signed: 12/12/2020 5:19:59 PM By: [name] Keeler PA-C Signed: 12/12/2020 6:05:01 PM By: [name] Entered By: [name] on 12/12/2020 10:55:34 -------------------------------------------------------------------------------- HPI Details Patient Name: Date of Service: [name], [name] 12/12/2020 10:00 A M Medical Record Number: [id number] Patient Account Number: [id number] Date of Birth/Sex: Treating RN: [birth year] (65 y.o. [name] Primary Care Provider: [name] Other Clinician: Referring Provider: Treating Provider/Extender: [name] in Treatment: 54 History of Present Illness HPI Description: the remaining wound is over the left medial ankle. Similar wound over the right medial ankle healed largely with use of Apligraf. Most recently we have been using Hydrofera Blue over this wound with considerable improvement. The patient has been extensively worked up in the past for her venous insufficiency and she is not a candidate for antireflux surgery although I have none of the details available currently. 08/24/14; considerable improvement today. About 50% of this wound areas now epithelialized. The base of the wound appears to be healthier granulation.as opposed to last week when she had deteriorated a considerable improvement 08/17/14; unfortunately the wound has regressed somewhat. The areas of epithelialization from the superior aspect are not nearly as healthy as they were last week. The patient  thinks her Hydrofera Blue slipped. 09/07/14; unfortunately the area has markedly regressed in the 2 weeks since I've seen this. There is an odor surrounding erythema. The healthy granulation tissue that we had at the base of the wound now is a dusky color. The nurse reports green drainage 09/14/14; the area looks somewhat better  than last week. There is less erythema and less drainage. The culture I did did not show any growth. Nevertheless I think it is better to continue the Cipro and doxycycline for a further week. The remaining wound area was debridement. 09/21/14. Wound did not require debridement last week. Still less erythema and less drainage. She can complete her antibiotics. The areas of epithelialization in the superior aspect of the wound do not look as healthy as they did some weeks ago 10/05/14 continued improvement in the condition of this wound. There is advancing epithelialization. Less aggressive debridement required 10/19/14 continued improvement in the condition and volume of this wound. Less aggressive debridement to the inferior part of this to remove surface slough and fibrinous eschar 11/02/14 no debridement is required. The surface granulation appears healthy although some of her islands of epithelialization seem to have regressed. No evidence of infection 11/16/14; lites surface debridement done of surface eschar. The wound does not look to be unhealthy. No evidence of infection. Unfortunately the patient has had podiatry issues in the right foot and for some reason has redeveloped small surface ulcerations in the medial right ankle. Her original presentation involved wounds in this area 11/23/14 no debridement. The area on the right ankle has enlarged. The left ankle wound appears stable in terms of the surface although there is periwound inflammation. There has been regression in the amount of new skin 11/30/14 no debridement. Both wound areas appear healthy. There was no evidence of  infection. The the new area on the right medial ankle has enlarged although that both the surfaces appear to be stable. 12/07/14; Debridement of the right medial ankle wound. No no debridement was done on the left. 12/14/14 no major change in and now bilateral medial ankle wounds. Both of these are very painful but the no overt evidence of infection. She has had previous venous ablation 12/21/14; patient states that her right medial ankle wound is considerably more painful last week than usual. Her left is also somewhat painful. She could not tolerate debridement. The right medial ankle wound has fibrinous surface eschar 12/28/14 this is a patient with severe bilateral venous insufficiency ulcers. For a considerable period of time we actually had the one on the right medial ankle healed however this recently opened up again in June. The left medial ankle wound has been a refractory area with some absent flows. We had some success with Hydrofera Blue on this area and it literally closed by 50% however it is recently opened up Foley. Both of these were debridement today of surface eschar. She tolerates this poorly 01/25/15: No change in the status of this. Thick adherent escar. Very poor tolerance of any attempt at debridement. I had healed the right medial malleolus wound for a considerable amount of time and had the left one down to about 50% of the volume although this is totally regressed over the last 48 weeks. Further the right leg has reopened. she is trying to make a appointment with pain and vascular, previous ablations with Dr. Aleda Grana. I do not believe there is an arterial insufficiency issue here 02/01/15 the status of the adherent eschar bilaterally is actually improved. No debridement was done. She did not manage to get vascular studies done 02/08/15 continued debridement of the area was done today. The slough is less adherent and comes off with less pressure. There is no surrounding  infection peripheral pulses are intact 02/15/15 selective debridement with a disposable curette. Again the slough is less adherent  and comes off with less difficulty. No surrounding infection peripheral pulses are intact. 02/22/15 selective debridement of the right medial ankle wound. Slough comes off with less difficulty. No obvious surrounding infection peripheral pulses are intact I did not debridement the one on the left. Both of these are stable to improved 03/01/15 selective debridement of both wound areas using a curette to. Adherent slough cup soft with less difficulty. No obvious surrounding infection. The patient tells me that 2 days ago she noted a rash above the right leg wrap. She did not have this on her lower legs when she change this over she arrives with widespread left greater than right almost folliculitis-looking rash which is extremely pruritic. I don't see anything to culture here. There is no rash on the rest of her body. She feels well systemically. 03/08/15; selective debridement of both wounds using a curette. Base of this does not look unhealthy. She had limegreen drainage coming out of the left leg wound and describes a lot of drainage. The rash on her left leg looks improved to. No cultures were done. 03/22/15; patient was not here last week. Basal wounds does not look healthy and there is no surrounding erythema. No drainage. There is still a rash on the left leg that almost looks vasculitic however it is clearly limited to the top of where the wrap would be. 04/05/15; on the right required a surgical debridement of surface eschar and necrotic subcutaneous tissue. I did not debridement the area on the left. These continue to be large open wounds that are not changing that much. We were successful at one point in healing the area on the right, and at the same time the area on the left was roughly half the size of current measurements. I think a lot of the deterioration has to  do with the prolonged time the patient is on her feet at work 04/19/15 I attempted-like surface debridement bilaterally she does not tolerate this. She tells me that she was in allergic care yesterday with extreme pain over her left lateral malleolus/ankle and was told that she has an "sprain" 05/03/15; large bilateral venous insufficiency wounds over the medial malleolus/medial aspect of her ankles. She complains of copious amounts of drainage and his usual large amounts of pain. There is some increasing erythema around the wound on the right extending into the medial aspect of her foot to. historically she came in with these wounds the right one healed and the left one came down to roughly half its current size however the right one is reopened and the left is expanded. 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 05/10/15 large bilateral venous insufficiency wounds. There is less adherence surface left however the surface culture that I did last week grew pseudomonas therefore bilateral selective debridement score necessary. There is surrounding erythema. The patient describes severe bilateral drainage and a lot of pain in the left ankle. Apparently her podiatrist was were ready to do a cortisone shot 05/17/15; the patient complains of pain and again copious amounts of drainage. 05/24/15; we used Iodo flex last week. Patient notes considerable improvement in wound drainage. Only needed to change this once. 05/31/15; we continued Iodoflex; the base of these large wounds bilaterally is not too bad but there is probably likely a significant bioburden here. I would like to debridement just doesn't tolerate it. 06/06/14 I would like to continue the Iodoflex although she  still hasn't managed to obtain supplies. She has bilateral medial malleoli or large wounds which are mostly superficial. Both of them are covered circumferentially with some nonviable  fibrinous slough although she tolerates debridement very poorly. She apparently has an appointment for an ablation on the right leg by interventional radiology. 06/14/15; the patient arrives with the wounds and static condition. We attempted a debridement although she does not do well with this secondary to pain. I 07/05/15; wounds are not much smaller however there appears to be a cleaner granulating base. The left has tight fibrinous slough greater than the right. Debridement is tolerated poorly due to pain. Iodoflex is done more for these wounds in any of the multitude of different dressings I have tried on the left 1 and then subsequently the right. 07/12/15; no change in the condition of this wound. I am able to do an aggressive debridement on the right but not the left. She simply cannot tolerate it. We have been using Iodoflex which helps somewhat. It is worthwhile remembering that at one point we healed the right medial ankle wound and the left was about 25% of the current circumference. We have suggested returning to vascular surgery for review of possible further ablations for one reason or another she has not been able to do this. 07/26/15 no major change in the condition of either wound on her medial ankle. I did not attempt to debridement of these. She has been aggressively scrubbing these while she is in the shower at home. She has her supply of Iodoflex which seems to have done more for these wounds then anything I have put on recently. 08/09/15 wound area appears larger although not verified by measurements. Using Iodoflex 09/05/2015 -- she was here for avisit today but had significant problems with the wound and I was asked to see her for a physician opinion. I have summarize that this lady has had surgery on her left lower extremity about 10 years ago where the possible veins stripping was done. She has had an opinion from interventional radiology around November 2016 where no further  sclerotherapy was ordered. The patient works 12 hours a day and stands on a concrete floor with work boots and is unable to get the proper compression she requires and cannot elevate her limbs appropriately at any given time. She has recently grown Pseudomonas from her wound culture but has not started her ciprofloxacin which was called in for her. 09/13/15 this continues to be a difficult situation for this patient. At one point I had this wound down to a 1.5 x 1.5" wound on her left leg. This is deteriorated and the right leg has reopened. She now has substantial wounds on her medial calcaneus, malleoli and into her lower leg. One on the left has surface eschar but these are far too painful for me to debridement here. She has a vascular surgery appointment next week to see if anything can be done to help here. I think she has had previous ablations several years ago at [facility]. She has no major edema. She tells me that she did not get product last time [facility]] and went for several days without it. She continues to work in work boots 12 hours a day. She cannot get compression/4-layer under her work boots. 09/20/15 no major change. Periwound edema control was not very good. Her point with pain and vascular is next Wednesday the 25th 09/28/15; the patient is seen vascular surgery and is apparently scheduled for  repeat duplex ultrasounds of her bilateral lower legs next week. 10/05/15; the patient was seen by Dr. Doren Custard of vascular surgery. He feels that she should have arterial insufficiency excluded as cause/contributed to her nonhealing stage she is therefore booked for an arteriogram. She has apparently monophasic signals in the dorsalis pedis pulses. She also of course has known severe chronic venous insufficiency with previous procedures as noted previously. I had another long discussion with the patient today about her continuing to work 12 hour shifts. I've written her out for 2 months area  had concerns about this as her work location is currently undergoing significant turmoil and this may lead to her termination. She is aware of this however I agree with her that she simply cannot continue to stand for 12 hours multiple days a week with the substantial wound areas she has. 10/19/15; the Dr. Doren Custard appointment was largely for an arteriogram which was normal. She does not have an arterial issue. He didn't make a comment about her chronic venous insufficiency for which she has had previous ablations. Presumably it was not felt that anything additional could be done. The patient is now out of work as I prescribed 2 weeks ago. Her wounds look somewhat less aggravated presumably because of this. I felt I would give debridement another try today 10/25/15; no major change in this patient's wounds. We are struggling to get her product that she can afford into her own home through her insurance. 11/01/15; no major change in the patient's wounds. I have been using silver alginate as the most affordable product. I spoke to Dr. Marla Roe last week with her requested take her to the OR for surgical debridement and placement of ACEL. Dr. Marla Roe told me that she would be willing to do this however [facility] will not cover this, fortunately the patient has [place] healthcare of some variant 11/08/15; no major change in the patient's wounds. She has been completely nonviable surface that this but is in too much pain with any attempted debridement are clinic. I have arranged for her to see Dr. Marla Roe ham of plastic surgery and this appointment is on Monday. I am hopeful that they will take her to the OR for debridement, possible ACEL ultimately possible skin graft 11/22/15 no major change in the patient's wounds over her bilateral medial calcaneus medial malleolus into the lower legs. Surface on these does not look too bad however on the left there is surrounding erythema and tenderness.  This may be cellulitis or could him sleepy tinea. 11/29/15; no major changes in the patient's wounds over her bilateral medial malleolus. There is no infection here and I don't think any additional antibiotics are necessary. There is now plan to move forward. She sees Dr. Marla Roe in a week's time for preparation for operative debridement and ACEL placement I believe on 7/12. She then has a follow-up appointment with Dr. Marla Roe on 7/21 12/28/15; the patient returns today having been taken to the [facility] by Dr. Marla Roe 12/12/15 she underwent debridement, intraoperative cultures [which were negative]. She had placement of a wound VAC. Parent really ACEL was not available to be placed. The wound VAC foam apparently adhered to the wound since then she's been using silver alginate, Xeroform under Ace wraps. She still says there is a lot of drainage and a lot of pain 01/31/16; this is a patient I see monthly. I had referred her to Dr. Marla Roe him of plastic surgery for large wounds on her bilateral medial  ankles. She has been to the OR twice once in early July and once in early August. She tells me over the last 3 weeks she has been using the wound VAC with ACEL underneath it. On the right we've simply been using silver alginate. Under Kerlix Coban wraps. 02/28/16; this is a patient I'm currently seeing monthly. She is gone on to have a skin graft over her large venous insufficiency ulcer on the left medial ankle. This was done by Dr. Marla Roe him. The patient is a bit perturbed about why she didn't have one on her right medial ankle wound. She has been using silver alginate to this. 03/06/16; I received a phone call from her plastic surgery Dr. Marla Roe. She expressed some concern about the viability of the skin graft she did on the left medial ankle wound. Asked me to place Endoform on this. She told me she is not planning to do a subsequent skin graft on the right as the left one did not take very  well. I had placed Hydrofera Blue on the right 03/13/16; continue to have a reasonably healthy wound on the right medial ankle. Down to 3 mm in terms of size. There is epithelialization here. The area on the left medial ankle is her skin graft site. I suppose the last week this looks somewhat better. She has an open area inferiorly however in the [name] there appears to be some viable tissue. There is a lot of surface callus and eschar that will eventually need to come off however none of this looked to be infected. Patient states that the is able to keep the dressing on for several days which is an improvement. 03/20/16 no major change in the circumference of either wound however on the left side the patient was at Dr. Eusebio Friendly office and they did a debridement of left wound. 50% of the wound seems to be epithelialized. I been using Endoform on the left Hydrofera Blue in the right 03/27/16; she arrives today with her wound is not looking as healthy as they did last week. The area on the right clearly has an adherent surface to this a very similar surface on the left. Unfortunately for this patient this is all too familiar problem. Clearly the Endoform is not working and will need to change that today that has some potential to help this surface. She does not tolerate debridement in this clinic very well. She is changing the dressing wants 04/03/16; patient arrives with the wounds looking somewhat better especially on the right. Dr. Migdalia Dk change the dressing to silver alginate when she saw her on Monday and also sold her some compression socks. The usefulness of the latter is really not clear and woman with severely draining wounds. 04/10/16; the patient is doing a bit of an experiment wearing the compression stockings that Dr. Migdalia Dk provided her to her left leg and the out of legs based dressings that we provided to the right. 05/01/16; the patient is continuing to wear compression stockings Dr.  Migdalia Dk provided her on the left that are apparently silver impregnated. She has been using Iodoflex to the right leg wound. Still a moderate amount of drainage, when she leaves here the wraps only last for 4 days. She has to change the stocking on the left leg every night 05/15/16; she is now using compression stockings bilaterally provided by Dr. Marla Roe. She is wearing a nonadherent layer over the wounds so really I don't think there is anything specific being done to  this now. She has some reduction on the left wound. The right is stable. I think all healing here is being done without a specific dressing 06/09/16; patient arrives here today with not much change in the wound certainly in diameter to large circular wounds over the medial aspect of her ankle bilaterally. Under the light of these services are certainly not viable for healing. There is no evidence of surrounding infection. She is wearing compression stockings with some sort of silver impregnation as prescribed by Dr. Marla Roe. She has a follow-up with her tomorrow. 06/30/16; no major change in the size or condition of her wounds. These are still probably covered with a nonviable surface. She is using only her purchase stockings. She did see Dr. Marla Roe who seemed to want to apply Dakin's solution to this I'm not extreme short what value this would be. I would suggest Iodoflex which she still has at home. 07/28/16; I follow Mrs. Espejel episodically along with Dr. Marla Roe. 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 She has been applying a topical collagen ointment to both wounds with Adaptic. I don't think Dr. Marla Roe is planning to take her back to the OR. 08/19/16; I follow Mrs. Jeneen Rinks on a monthly basis along with Dr. Marla Roe of plastic surgery. 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 I been  following her for a number of years. At one point I was able to get the right medial malleolus wound to heal and had the left medial malleolus down to about half its current size however and I had to send her to plastic surgery for an operative debridement. Since then things have been stable to slightly improve the area on the right is slightly better one in the left about the same although there is much less adherent surface than I'm used to with this patient. She is using some form of liquid collagen gel that Dr. Marla Roe provided a Kerlix cover with the patient's own pressure stockings. She tells me that she has extreme pain in both ankles and along the lateral aspect of both feet. She has been unable to work for some period of time. She is telling me she is retiring at the beginning of April. She sees Dr. Doran Durand of orthopedics next week 09/22/16; patient has not seen Dr. Marla Roe since the last time she is here. I'm not really sure what she is using to the wounds other than bits and pieces of think she had left over including most recently Hydrofera Blue. She is using juxtalite stockings. She is having difficulty with her husband's recent illness "stroke". She is having to transport him to various doctors appointments. Dr. Marla Roe left her the option of a repeat debridement with ACEL however she has not been able to get the time to follow-up on this. She continues to have a fair amount of drainage out of these wounds with certainly precludes leaving dressings on all week 10/13/16; patient has not seen Dr. Marla Roe since she was last in our clinic. I'm not really sure what she is doing with the wounds, we did try to get her [name] and I think she is actually using this most of the time. Because of drainage she states she has to change this every second day although this is an improvement from what she used to do. She went to see Dr. Doran Durand who did not think she had a  muscular issue  with regards to her feet, he referred her to a neurologist and I think the appointment is sometime in June. I changed her back to Iodoflex which she has used in the past but not recently. 11/03/16; the patient has been using Iodoflex although she ran out of this. Still claims that there is a lot of drainage although the wound does not look like this. No surrounding erythema. She has not been back to see Dr. Marla Roe 11/24/16; the patient has been using Iodoflex again but she ran out of it 2 or 3 days ago. There is no major change in the condition of either one of these wounds in fact they are larger and covered in a thick adherent surface slough/nonviable tissue especially on the left. She does not tolerate mechanical debridement in our clinic. Going back to see Dr. Marla Roe of plastic surgery for an operative debridement would seem reasonable. 12/15/16; the patient has not been back to see Dr. Marla Roe. She is been dealing with a series of illnesses and her husband which of monopolized her time. She is been using Sorbact which we largely supplied. She states the drainage is bad enough that it maximum she can go 2-3 days without changing the dressing 01/12/2017 -- the patient has not been back for about 4 weeks and has not seen Dr. Marla Roe not does she have any appointment pending. 01/23/17; patient has not seen Dr. Marla Roe even though I suggested this previously. She is using Santyl that was suggested last week by Dr. Con Memos this Cost her $16 through her insurance which is indeed surprising 02/12/17; continuing Santyl and the patient is changing this daily. A lot of drainage. She has not been back to see plastic surgery she is using an Ace wrap. Our intake nurse suggested wrap around stockings which would make a good reasonable alternative 02/26/17; patient is been using Santyl and changing this daily due to drainage. She has not been to see plastic surgery she uses in April Ace wrap to control  the edema. She did obtain extremitease stockings but stated that the edema in her leg was to big for these 03/20/17; patient is using Santyl and Anasept. Surfaces looked better today the area on the right is actually measuring a little smaller. She has states she has a lot of pain in her feet and ankles and is asking for a consult to pain control which I'll try to help her with through our case manager. 04/10/17; the patient arrives with better-looking wound surfaces and is slightly smaller wound on the left she is using a combination of Santyl and Anasept. She has an appointment or at least as started in the pain control [name] associated with [facility] 05/14/17; this is a patient who I followed for a prolonged period of time. She has venous insufficiency ulcers on her bilateral medial ankles. At one point I had this down to a much smaller wound on the left however these reopened and we've never been able to get these to heal. She has been using Santyl and Anasept gel although 2 weeks ago she ran out of the Anasept gel. She has a stable appearance of the wound. She is going to the wound care clinic at [facility]. They wanted do a nerve block/spinal block although she tells me she is reluctant to go forward with that. 05/21/17; this is a patient I have followed for many years. She has venous insufficiency ulcers on her bilateral medial ankles. Chronic pain and deformity  in her ankles as well. She is been to see plastic surgery as well as orthopedics. Using PolyMem AG most recently/Kerramax/ABDs and 2 layer compression. She has managed to keep this on and she is coming in for a nurse check to change the dressing on Tuesdays, we see her on Fridays 06/05/17; really quite a good looking surface and the area especially on the right medial has contracted in terms of dimensions. Well granulated healthy-looking tissue on both sides. Even with an open curet there is nothing that even feels abnormal  here. This is as good as I've seen this in quite some time. We have been using PolyMem AG and bringing her in for a nurse check 06/12/17; really quite good surface on both of these wounds. The right medial has contracted a bit left is not. We've been using PolyMem and AG and she is coming in for a nurse visit 06/19/17; we have been using PolyMem AG and bringing her in for a nurse check. Dimensions of her wounds are not better but the surfaces looked better bilaterally. She complained of bleeding last night and the left wound and increasing pain bilaterally. She states her wound pain is more neuropathic than just the wounds. There was some suggestion that this was radicular from her pain management doctor in talking to her it is really difficult to sort this out. 06/26/17; using PolyMem and AG and bringing her in for a nurse check as All of this and reasonably stable condition. Certainly not improved. The dimensions on the lateral part of the right leg look better but not really measuring better. The medial aspect on the left is about the same. 07/03/16; we have been using PolyMen AG and bringing her in for a nurse check to change the dressings as the wounds have drainage which precludes once weekly changing. We are using all secondary absorptive dressings.our intake nurse is brought up the idea of using a wound VAC/snap VAC on the wound to help with the drainage to see if this would result in some contraction. This is not a bad idea. The area on the right medial is actually looking smaller. Both wounds have a reasonable-looking surface. There is no evidence of cellulitis. The edema is well controlled 07/10/17; the patient was denied for a snap VAC by her insurance. The major issue with these wounds continues to be drainage. We are using wicked PolyMem AG and she is coming in for a nurse visit to change this. The wounds are stable to slightly improved. The surface looks vibrant and the area on the  right certainly has shrunk in size but very slowly 07/17/17; the patient still has large wounds on her bilateral medial malleoli. Surface of both of these wounds looks better. The dimensions seem to come and go but no consistent improvement. There is no epithelialization. We do not have options for advanced treatment products due to insurance issues. They did not approve of the wound VAC to help control the drainage. More recently we've been using PolyMem and AG wicked to allow drainage through. We have been bringing her in for a nurse visit to change this. We do not have a lot of options for wound care products and the home again due to insurance issues 07/24/17; the patient's wound actually looks somewhat better today. No drainage measurements are smaller still healthy-looking surface. We used silver collagen under PolyMen started last week. We have been bringing her in for a dressing change 07/31/17; patient's wound surface continued to look  better and I think there is visible change in the dimensions of the wound on the right. Rims of epithelialization. We have been using silver collagen under PolyMen and bringing her in for a dressing change. There appears to be less drainage although she is still in need of the dressing change 08/07/17. Patient's wound surface continues to look better on both sides and the area on the right is definitely smaller. We have been using silver collagen and PolyMen. She feels that the drainage has been it has been better. I asked her about her vascular status. She went to see Dr. Aleda Grana at [facility] and had some form of ablation. I don't have much detail on this. I haven't my notes from 2016 that she was not a candidate for any further ablation but I don't have any more information on this. We had referred her to vein and vascular I don't think she ever went. He does not have a history of PAD although I don't have any information on this either. We don't even have  ABIs in our record 08/14/17; we've been using silver collagen and PolyMen cover. And putting the patient and compression. She we are bringing her in as a nurse visit to change this because ofarge amount of drainage. We didn't the ABIs in clinic today since they had been done in many moons 1.2 bilaterally. She has been to see vein and vascular however this was at [facility] and she had ablation although I really don't have any information on this all seemed biking get a report. She is also been operatively debrided by plastic surgery and had a cell placed probably 8-12 months ago. This didn't have a major effect. We've been making some gains with current dressings 08/19/17-She is here in follow-up evaluation for bilateral medial malleoli ulcers. She continues to tolerate debridement very poorly. We will continue with recently changed topical treatment; if no significant improvement may consider switching to Iodosorb/Iodoflex. 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 She has been using silver collagen and PolyMem. I believe she has been used and tried on Iodoflex before. 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 These have been very refractory over time. We have been using silver collagen and PolyMen. She is coming in once a week for a doctor's and once a week for nurse visits. We are actually making some progress 09/18/17; the patient's wounds are smaller especially on the right medial. She arrives today to upset to consider even washing these off with Anasept which I think is been part of the reason this is been closing. We've been using collagen covered in PolyMen otherwise. It is noted that she has a small area of folliculitis on the right medial calf that.  As we are wrapping her legs I'll give her a short course of doxycycline to make sure this doesn't amount to anything. She is a long list of complaints today including imbalance, shortness of breath on exertion, inversion of her left ankle. With regards to the latter complaints she is been to see orthopedics and they offered her a tendon release surgery I believe but wanted her wounds to be closed first. I have recommended she go see her primary physician with regards to everything else. 09/25/17; patient's wounds are about the same  size. We have made some progress bilaterally although not in recent weeks. She will not allow me T wash these wounds with Anasept even if she is doing her cell. Wheeze we've been using collagen covered in PolyMen. Last week she had a small area of folliculitis this is now opened into a small wound. She completed 5 days of trimethoprim sulfamethoxazole 10/02/17; unfortunately the area on her left medial ankle is worse with a larger wound area towards the Achilles. The patient complains of a lot of pain. She will not allow debridement although visually I don't think there is anything to debridement in any case. We have been using silver collagen and PolyMen for several months now. Initially we are making some progress although I'm not really seeing that today. We will move back to [name]. His admittedly this is a bit of a repeat however I'm hoping that his situation is different now. The patient tells me she had her leg on the left give out on her yesterday this is process some pain. 10/09/17; the patient is seen twice a week largely because of drainage issues coming out of the chronic medial bimalleolar wounds that are chronic. Last week the dimensions of the one on the left looks a little larger I changed her to [name]. She comes in today with a history of terrible pain in the bilateral wound areas. She will not allow debridement. She will not even allow a tissue  culture. There is no surrounding erythema no no evidence of cellulitis. We have been putting her Kerlix Coban man. She will not allow more aggressive compression as there was a suggestion to put her in 3 layer wraps. 10/16/17; large wounds on her bilateral medial malleoli. These are chronic. Not much change from last week. The surface looks have healthy but absolutely no epithelialization. A lot of pain little less so of drainage. She will not allow debridement or even washing these off in the vigorous fashion with Anasept. 10/23/17; large wounds on her bilateral malleoli which are chronic. Some improvement in terms of size perhaps on the right since last time I saw these. She states that after we increased the 3 layer compression there was some bleeding, when she came in for a nurse visit she did not want 3 layer compression put back on about our nurse managed to convince her. She has known chronic venous visit issues and I'm hoping to get her to tolerate the 3 layer compression. using Hydrofera Blue 10/30/17; absolutely no change in the condition of either wound although we've had some improvement in dimensions on the right.. Attempted to put her in 3 layer compression she didn't tolerated she is back in 2 layer compression. We've been using Hydrofera Blue We looked over her past records. She had venous reflux studies in November 2016. There was no evidence of deep venous reflux on the right. Superficial vein did not show the greater saphenous vein at think this is been previously ablated the small saphenous vein was within normal limits. The left deep venous system showed no DVT the vessels were positive for deep venous reflux in the posterior tibial veins at the ankle. The greater saphenous vein was surgically absent small saphenous vein was within normal limits. She went to vein and vascular at [facility]. I believe she had an ablation on the left greater saphenous vein. I'll update her reflux  studies perhaps ever reviewed by vein and vascular. We've made absolutely no progress in these wounds. Will also  try to read and TheraSkins through her insurance 11/06/17; W the patient apparently has a 2 week follow-up with vein and vascular I like him to review the whole issue with regards to her previous vascular workup by Dr. Aleda Grana. We've really made no progress on these wounds in many months. She arrives today with less viable looking surface on the left medial ankle wound. This was apparently looking about the same on Tuesday when she was here for nurse visit. 11/13/17; deep tissue culture I did last time of the left lower leg showed multiple organisms without any predominating. In particular no Staphylococcus or group A strep were isolated. We sent her for venous reflux studies. She's had a previous left greater saphenous vein stripping and I think sclerotherapy of the right greater saphenous vein. She didn't really look at the lesser saphenous vein this both wounds are on the medial aspect. She has reflux in the common femoral vein and popliteal vein and an accessory vein on the right and the common femoral vein and popliteal vein on the left. I'm going to have her go to see vein and vascular just the look over things and see if anything else beside aggressive compression is indicated here. We have not been able to make any progress on these wounds in spite of the fact that the surface of the wounds is never look too bad. 11/20/17; no major change in the condition of the wounds. Patient reports a large amount of drainage. She has a lot of complaints of pain although enlisting her today I wonder if some of this at least his neuropathic rather than secondary to her wounds. She has an appointment with vein and vascular on 12/30/17. The refractory nature of these wounds in my mind at least need vein and vascular to look over the wounds the recent reflux studies we did and her history to see if  anything further can be done here. I also note her gait is deteriorated quite a bit. 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 She has a bilateral Trendelenburg gait. I wonder if this is neuropathic or perhaps multilevel radicular. 11/27/17; her wounds actually looks slightly better. Healthy-looking granulation tissue a scant amount of epithelialization. [place] healthcare will not pay for [employer]. They will play for tri layer Oasis and Dermagraft. This is not a diabetic ulcer. We'll try for the tri layer Oasis. She still complains of some drainage. She has a vein and vascular appointment on 12/30/17 12/04/17; the wounds visually look quite good. Healthy-looking granulation with some degree of epithelialization. We are still waiting for response to our request for trial to try layer Oasis. Her appointment with vascular to review venous and arterial issues isn't sold the end of July 7/31. Not allow debridement or even vigorous cleansing of the wound surface. 12/18/17; slightly smaller especially on the right. 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 We've been using Hydrofera Blue. We still are looking into triple layer Oasis through her insurance 01/08/18 on evaluation today patient's wound actually appears to be showing signs of good improvement at this point in time. She has been tolerating the dressing changes without complication. Fortunately there does not appear to be any evidence of infection at this point in time. We have been utilizing silver nitrate which does seem to be of benefit for her which is also good news. Overall I'm very happy with how things seem to be both regards appearance as well as measurement. Patient did see  Dr. Bridgett Larsson for evaluation on 12/30/17. In his assessment he felt that stripping would not likely add much more than chronic compression to the patient's healing process. His recommendation was to follow-up in three months with Dr. Doren Custard  if she hasn't healed in order to consider referral back to you and see vascular where she previously was in a trial and was able to get her wound to heal. I'll be see what she feels she when you staying compression and he reiterated this as well. 01/13/18 on evaluation today patient appears to actually be doing very well in regard to her bilateral medial malleolus ulcers. She seems to have tolerated the chemical cauterization with silver nitrate last week she did have some pain through that evening but fortunately states that I'll be see since it seems to be doing better she is overall pleased with the progress. 01/21/18; really quite a remarkable improvement since I've last seen these wounds. We started using silver nitrate specially on the islands of hyper granulation which for some reason her around the wound circumference. This is really done quite nicely. Primary dressing Hydrofera Blue under 4 layer compression. She seems to be able to hold out without a nurse rewrap. Follow-up in 1 week 01/28/18; we've continued the hydrofera blue but continued with chemical cauterization to the wound area that we started about a month ago for irregular hyper granulation. She is made almost stunning improvement in the overall wound dimensions. I was not really expecting this degree of improvement in these chronic wounds 02/05/18; we continue with Hydrofera Bluebut of also continued the aggressive chemical cauterization with silver nitrate. We made nice progress with the right greater than left wound. 02/12/18. We continued with Hydrofera Blue after aggressive chemical cauterization with silver nitrate. We appear to be making nice progress with both wound areas 02/19/2018; we continue with [name] after washing the wounds vigorously with Anasept spray and chemical cauterization with silver nitrate. We are making excellent progress. The area on the right's just about closed 02/26/2018. The area on the left  medial ankle had too much necrotic debris today. I used a #5 curette we are able to get most of the soft. I continued with the silver nitrate to the much smaller wound on the right medial ankle she had a new area on her right lower pretibial area which she says was due to a role in her compression 03/05/2018; both wound areas look healthy. Not much change in dimensions from last week. I continue to use silver nitrate and Hydrofera Blue. The patient saw Dr. Doren Custard of vein and vascular. He felt she had venous stasis ulcers. He felt based on her previous arteriogram she should have adequate circulation for healing. Also she has deep venous reflux but really no significant correctable superficial venous reflux at this time. He felt we should continue with conservative management including leg elevation and compression 04/02/2018; since we last saw this woman about a month ago she had a fall apparently suffered a pelvic fracture. I did not look up the x-ray. Nevertheless because of pain she literally was bedbound for 2 weeks and had home health coming out to change the dressing. Somewhat predictably this is resulted in considerable improvement in both wound areas. The right is just about closed on the medial malleolus and the left is about half the size. 04/16/2018; both her wounds continue to go down in size. Using Hydrofera Blue. 05/07/18; both her wounds appeared to be improving especially on the right where  it is almost closed. We are using Hydrofera Blue 05/14/2018; slightly worse this week with larger wounds. Surface on the left medial not quite as good. We have been using Hydrofera Blue 05/21/18; again the wounds are slightly larger. Left medial malleolus slightly larger with eschar around the circumference. We have been using Hydrofera Blue undergoing a wraps for a prolonged period of time. This got a lot better when she was more recumbent due to a fall and a back injury. I change the primary dressing  the silver alginate today. She did not tolerate a 4 layer compression previously although I may need to bring this up with her next time 05/28/2018; area on the left medial malleolus again is slightly larger with more drainage. Area on the right is roughly unchanged. She has a small area of folliculitis on the right medial just on the lower calf. This does not look ominous. 06/03/2018 left medial malleolus slightly smaller in a better looking surface. We used silver nitrate on this last time with silver alginate. The area on the right appears slightly smaller 1/10; left medial malleolus slightly smaller. Small open area on the right. We used silver nitrate and silver alginate as of 2 weeks ago. We continue with the wound and compression. These got a lot better when she was off her feet 1/17; right medial malleolus wound is smaller. The left may be slightly smaller. Both surfaces look somewhat better. 1/24; both wounds are slightly smaller. Using silver alginate under Unna boots 1/31; both wounds appear smaller in fact the area on the right medial is just about closed. Surface eschar. We have been using silver alginate under Unna boots. The patient is less active now spends let much less time on her feet and I think this is contributed to the general improvement in the wound condition 2/7; both wounds appear smaller. I was hopeful the right medial would be closed however there there is still the same small open area. Slight amount of surface eschar on the left the dimensions are smaller there is eschar but the wound edges appear to be free. We have been using silver alginate under Unna boot's 2/14; both wounds once again measure smaller. Circumferential eschar on the left medial. We have been using silver alginate under Unna boots with gradual improvement 2/21; the area on the right medial malleolus has healed. The area on the left is smaller. We have been using silver alginate and Unna boots. We can  discharge wrapping the right leg she has 20/30 stockings at home she will need to protect the scar tissue in this area 2/28; the area on the right medial malleolus remains closed the patient has a compression stocking. The area on the left is smaller. We have been using silver alginate and Unna boots. 3/6 the area on the right medial ankle remains closed. Good edema control noted she is using her own compression stocking. The area on the left medial ankle is smaller. We have been managing this with silver alginate and Unna boots which we will continue today. 3/13; the area on the right medial ankle remains closed and I'm declaring it healed today. When necessary the left is about the same still a healthy-looking surface but no major change and wound area. No evidence of infection and using silver alginate under unna and generally making considerable improvement 3/27 the area on the right medial ankle remains closed the area on the left is about the same as last week. Certainly not any worse we  have been using silver alginate under an Unna boot 4/3; the area on the right medial ankle remains closed per the patient. We did not look at this wound. The wound on the left medial ankle is about the same surface looks healthy we have been using silver alginate under an Unna boot 4/10; area on the right medial ankle remains closed per the patient. We did not look at this wound. The wound on the left medial ankle is slightly larger. The patient complains that the [facility] caused burning pain all week. She also told us that she was a lot more active this week. Changed her back to silver alginate 4/17; right medial ankle still closed per the patient. Left medial ankle is slightly larger. Using silver alginate. She did not tolerate Hydrofera Blue on this area 4/24; right medial ankle remains closed we have not look at this. The left medial ankle continues to get larger today by about a centimeter. We have  been using silver alginate under Unna boots. She complains about 4 layer compression as an alternative. She has been up on her feet working on her garden 5/8; right medial ankle remains closed we did not look at this. The left medial ankle has increased in size about 100%. We have been using silver alginate under Unna boots. She noted increased pain this week and was not surprised that the wound is deteriorated 5/15; no major change in SA however much less erythema ( one week of doxy ocellulitis). 5/22-65 year old female returns at 1 week to the clinic for left medial ankle wound for which we have been using silver alginate under 3 layer compression She was placed on DOXY at last visit - the wound is wider at this visit. She is in 3 layer compression 5/29; change to [name] last week. I had given her empiric doxycycline 2 weeks ago for a week. She is in 3 layer compression. She complains of a lot of pain and drainage on presentation today. 6/5; using Hydrofera Blue. I gave her doxycycline recently empirically for erythema and pain around the wound. Believe her cultures showed enterococcus which not would not have been well covered by doxycycline nevertheless the wound looks better and I don't feel specifically that the enterococcus needs to be covered. She has a new what looks like a wrap injury on her lateral left ankle. 6/12; she is using Hydrofera Blue. She has a new area on the left anterior lower tibial area. This was a wrap injury last week. 6/19; the patient is using Hydrofera Blue. She arrived with marked inflammation and erythema around the wound and tenderness. 12/01/18 on evaluation today patient appears to be doing a little bit better based on what I'm hearing from the standpoint of lassos evaluation to this as far as the overall appearance of the wound is concerned. Then sometime substandard she typically sees Dr. Dellia Nims. Nonetheless overall very pleased with the progress that she's  made up to this point. No fevers, chills, nausea, or vomiting noted at this time. 7/10; some improvement in the surface area. Aggressively debrided last week apparently. I went ahead with the debridement today although the patient does not tolerate this very well. We have been using Iodoflex. Still a fair amount of drainage 7/17; slightly smaller. Using Iodoflex. 7/24; no change from last week in terms of surface area. We have been using Iodoflex. Surface looks and continues to look somewhat better 7/31; surface area slightly smaller better looking surface. We have been using  Iodoflex. This is under Unna boot compression 8/7-Patient presents at 1 week with Unna boot and Iodoflex, wound appears better 8/14-Patient presents at 1 week with Iodoflex, we use the Unna boot, wound appears to be stable better.Patient is getting Botox treatment for the inversion of the foot for tendon release, Next week 8/21; we are using Iodoflex. Unna boot. The wound is stable in terms of surface area. Under illumination there is some areas of the wound that appear to be either epithelialized or perhaps this is adherent slough at this point I was not really clear. It did not wipe off and I was reluctant to debride this today. 8/28; we are using Iodoflex in an Unna boot. Seems to be making good improvement. 9/4; using Iodoflex and wound is slightly smaller. 9/18; we are using Iodoflex with topical silver nitrate when she is here. The wound continues to be smaller 10/2; patient missed her appointment last week due to GI issues. She left and Iodoflex based dressing on for 2 weeks. Wound is about the same size about the size of a dime on the left medial lower 10/9 we have been using Iodoflex on the medial left ankle wound. She has a new superficial probable wrap injury on the dorsal left ankle 10/16; we have been using Hydrofera Blue since last week. This is on the left medial ankle 10/23; we have been using Hydrofera Blue  since 2 weeks ago. This is on the left medial ankle. Dimensions are better 11/6; using Hydrofera Blue. I think the wound is smaller but still not closed. Left medial ankle 11/13; we have been using Hydrofera Blue. Wound is certainly no smaller this week. Also the surface not as good. This is the remanent of a very large area on her left medial ankle. 11/20; using Sorbact since last week. Wound was about the same in terms of size although I was disappointed about the surface debris 12/11; 3-week follow-up. Patient was on vacation. Wound is measuring slightly larger we have been using Sorbact. 12/18; wound is about the same size however surface looks better last week after debridement. We have been using Sorbact under compression 1/15 wound is probably twice the size of last time increased in length nonviable surface. We have been using Sorbact. She was running a mild fever and missed her appointment last week 1/22; the wound is come down in size but under illumination still a very adherent debris we have been Hydrofera Blue that I changed her to last week 1/29; dimensions down slightly. We have been using Hydrofera Blue 2/19 dimensions are the same however there is rims of epithelialization under illumination. Therefore more the surface area may be epithelialized 2/26; the patient's wound actually measures smaller. The wound looks healthy. We have been using Hydrofera Blue. I had some thoughts about running Apligraf then I still may do that however this looks so much better this week we will delay that for now 3/5; the wound is small but about the same as last week. We have been using Hydrofera Blue. No debridement is required today. 3/19; the wound is about the size of a dime. Healthy looking wound even under illumination. We have been using Hydrofera Blue. No mechanical debridement is necessary 3/26; not much change from last week although still looks very healthy. We have been using Hydrofera Blue  under Unna boots Patient was offered an ankle fusion by podiatry but not until the wound heals with a proceed with this. 4/9; the patient comes in today  with her original wound on the medial ankle looking satisfactory however she has some uncontrolled swelling in the middle part of her leg with 2 new open areas superiorly just lateral to the tibia. I think this was probably a wrap issue. She said she felt uncomfortable during the week but did not call in. We have been using Hydrofera Blue 4/16; the wound on the medial ankle is about the same. She has innumerable small areas superior to this across her mid tibia. I think this is probably folliculitis. She is also been working in the yard doing a lot of sweating 4/30; the patient issue on the upper areas across her mid tibia of all healed. I think this was excessive yard work if I remember. Her wound on the medial ankle is smaller. Some debris on this we have been using Hydrofera Blue under Unna boots 5/7; mid tibia. She has been using Hydrofera Blue under an Unna wrap. She is apparently going for her ankle surgery on June 3 10/28/19-Patient returns to clinic with the ankle wound, we are using Hydrofera Blue under Unna wrap, surgery is scheduled for her left foot for June 3 so she will be back for nurse visit next week READMISSION 01/17/2020 Mrs. Heckendorn is a 65 year old woman we have had in this clinic for a long period of time with severe venous hypertension and refractory wounds on her medial lower legs and ankles bilaterally. 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 When she was off her legs for a prolonged period example when she fell and suffered a compression fracture things would heal up quite nicely. She is now retired and we managed to heal up the right medial leg wound. The left one was very tiny last time I saw  this although still refractory. 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 It got to the point where this was interfering with her walking and she elected to proceed with a ankle arthrodesis to straighten her her ankle and leave her with a functional outcome for mobilization. The patient was referred to Dr. Doren Custard and really this took some time to arrange. Dr. Doren Custard saw her on 12/07/2019. Once again he verified that she had no arterial issues. She had previously had an angiogram several years ago. Follow-up ABIs on the left showed an ABI of 1.12 with triphasic waveforms and a TBI of 0.92. She is felt to have chronic deep venous insufficiency but I do not think it was felt that anything could be done from about this from an ablation point of view. At the time Dr. Doren Custard saw this patient the wounds actually look closed via the pictures in his clinic. The patient finally underwent her surgery on 12/15/2019. This went reasonably well and there was a good anatomic outcome. She developed a small distal wound dehiscence on the lateral part of the surgical wound. However more problematically she is developed recurrence of the wound on the medial left ankle. There are actually 2 wounds here one in the distal lower leg and 1 pretty much at the level of the medial malleolus. It is a more distal area that is more problematic. She has been using Hydrofera Blue which started on Friday before this she was simply Ace wrapping. There was a culture done that showed  Pseudomonas and she is on ciprofloxacin. A recent CNS on 8/11 was negative. The patient reports some pain but I generally think this is improving. She is using a cam boot completely nonweightbearing using a walker for pivot transfers and a wheelchair 8/24; not much improvement unfortunately she has a surgical wound on the lateral part in the venous insufficiency wound  medially. The bottom part of the medial insufficiency wound is still necrotic there is exposed tendon here. We have been using Hydrofera Blue under compression. Her edema control is however better 8/31; patient in for follow-up of his surgical wound on the lateral part of her left leg and chronic venous insufficiency ulcers medially. We put her back in compression last week. She comes in today with a complaint of 3 or 4 days worth of increasing pain. She felt her cam walker was rubbing on the area on the back of her heel. However there is intense erythema seems more likely she has cellulitis. She had 2 cultures done when she was seeing podiatry in the postop. One of them in late July showed Pseudomonas and she received a course of ciprofloxacin the other was negative on 8/11 she is allergic to penicillin with anaphylactoid complaints of hives oral swelling via information in epic 9/9; when I saw this patient last week she had intense anterior erythema around her wound on the right lateral heel and ankle and also into the right medial heel. Some of this was no doubt drainage and her walker boot however I was convinced she had cellulitis. I gave her Levaquin and Bactrim she is finishing up on this now. She is following up with Dr. [name] he saw her yesterday. He is taken her out of the walking boot of course she is still nonweightbearing. Her x-ray was negative for any worrisome features such as soft tissue air etc. Things are a lot better this week. She has home health. We have been using Hydrofera Blue under an [employer] which she put back on yesterday. I did not wrap her last week 9/17; her surrounding skin looks a lot better. In fact the area on the left lateral ankle has just a scant amount of eschar. The only remaining wound is the large area on the left medial ankle. Probably about 60% of this is healthy granulation at the surface however she has a significant divot distally. This has  adherent debris in it. I been using debridement and silver collagen to try and get this area to fill-in although I do not think we have made much progress this week 9/24; the patient's wound on the left medial ankle looks a lot better. The deeper divot area distally still requires debridement but this is cleaning up quite nicely we have been using silver collagen. The patient is complaining of swelling in her foot and is worried that that is contributing to the nonhealing of the ankle wound. She is also complaining of numbness in her anterior toes 10/4; left medial ankle. The small area distally still has a divot with necrotic material that I have been debriding away. This has an undermining area. She is approved for Apligraf. She saw Dr. [name] her surgeon on 10/1. I think he declared himself is satisfied with the condition of things. Still nonweightbearing till the end of the month. 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 Her surgical wound is well closed. There is no evidence of infection 10/11; the patient arrived in clinic today with the  expectation that we be able to put an Apligraf on this area after debridement however she arrives with a relatively large amount of green drainage on the dressing. The patient states that this started on Friday. She has not been systemically unwell. 10/19; culture I did last week showed both Enterococcus and Pseudomonas. I think this came in separate parts because I stopped her ciprofloxacin I gave her and prescribed her linezolid however now looking at the final culture result this is Pseudomonas which is resistant to quinolones. She has not yet picked up the linezolid apparently phone issues. We are also trying to get a topical antibiotic out of [place] in [place] they can be applied by home health. She is still having green drainage 10/16; the patient has her topical antibiotic from [facility] in [place]. This  is a compounded gel with vancomycin and ciprofloxacin and gentamicin. We are applying this on the wound bed with silver alginate over the top with Unna boot wraps. She arrives in clinic today with a lot less ominous looking drainage although she is only use this topical preparation once the second time today. She sees Dr. [name] her surgeon on Friday she has home health changing the dressing 11/2; still using her compounded topical antibiotic under silver alginate. Surface is cleaning up there is less drainage. We had an Apligraf for her today and I elected to apply it. A light coating of her antibiotic 04/25/2020 upon evaluation today patient appears to be doing well with regard to her ankle ulcer. There is a little bit of slough noted on the surface of the wound I am can have to perform sharp debridement to clear this away today. With that being said other than that fact overall I feel like she is making progress and we do see some new epithelial growth. There is also some improvement in the depth of the wound and that distal portion. There is little bit of slough there as well. 12/7; 2-week follow-up. Apligraf #3. Dimensions are smaller. Closing in especially inferiorly. Still some surface debris. Still using the [facility] topical antibiotic but I told her that I don't think this needs to be renewed 12/21; 2-week follow-up. Apligraf #4 dimensions are smaller. Nice improvement 06/05/2020; 2-week follow-up. The patient's wound on the left medial ankle looks really excellent. Nice granulation. Advancing epithelialization no undermining no evidence of infection. We would have to reapply for another Apligraf but with the condition of this wound I did not feel strongly about it. We used Hydrofera Blue under the same degree of compression. She follows up with Dr. [name] her surgeon a week Friday 06/13/2020 upon evaluation today patient appears to be doing excellent in regard to her wound. She has been  tolerating the dressing changes without complication. Fortunately there is no signs of active infection at this time. No fevers, chills, nausea, vomiting, or diarrhea. She was using Hydrofera Blue last week. 06/20/2020 06/20/2020 on evaluation today patient actually appears to be doing excellent in regard to her wound. This is measuring better and looking much better as well. She has been using the collagen that seems to be doing better for her as well even though the [name] was and is not sticking or feeling as rough on her wound. She did see Dr. [name] on Friday he is very pleased he also stated none of the hardware has shifted. That is great news 1/27; the patient has a small clean wound all that is remaining. I agree that this is  too small to really consider an Apligraf. Under illumination the surface is looking quite good. We have been using collagen although the dimensions are not any better this week 2/2; the patient has a small clean wound on the left medial ankle. Although this left of her substantial original areas. Measurements are smaller. We have been using polymen Ag under an Haematologist. 2/10; small area on the left medial ankle. This looks clean nothing to debride however dimensions are about the same we have been using polymen I think now for 2 weeks 2/17; not much change in surface area. We have been using polymen Ag without any improvement. 3/17; 1 month follow-up. The patient has been using endoform without any improvement in fact I think this looks worse with more depth and more expansion 3/24; no improvement. Perhaps less debris on the surface. We have been using Sorbact for 1 week 4/4; wound measures larger. She has edema in her leg and her foot which she tells as her wrap came down. We have been using Unna boots. Sorbact of the wound. She has been approved for Apligraf 09/12/2020 upon evaluation today patient appears to be doing well with regard to her wound. We did get the  Apligraf reapproved which is great news we have that available for application today. Fortunately there is no signs of infection and overall the patient seems to be doing great. The wound bed is nice and clean. 4/27; patient presents for her second application of Apligraf. She states over the past week she has been on her feet more often due to being outside in her garden. She has noted more swelling to her foot as a result. She denies increased warmth, pain or erythema to the wound site. 10/10/2020 upon evaluation today patient appears to be doing well with regard to her wound which does not appear to be quite as irritated as last week from what I am hearing. With that being said unfortunately she is having issues with some erythema and warmth to touch as well as an increase size. I do believe this likely is infected. 10/17/2020 upon evaluation today patient appears to be doing excellent in regard to her wound this is significantly improved as compared to last week. Fortunately I think that the infection is much better controlled at this point. She did have evidence of both Enterococcus as well as Staphylococcus noted on culture. Enterococcus really would not be helped significantly by the Cipro but the wound is doing so much better I am under the assumption that the Staphylococcus is probably the main organism that is causing the current infection. Nonetheless I think that she is doing excellent as far as that is concerned and I am very pleased in that regard. I would therefore recommend she continue with the Cipro. 10/31/2020 upon evaluation today patient appears to be doing well with regard to her wound. She has been tolerating the dressing changes without complication. Fortunately there is no signs of active infection and overall I am extremely pleased with where things stand today. No fevers, chills, nausea, vomiting, or diarrhea. With that being said she does have some green drainage coming from the  wound and although it looks okay I am a little concerned about the possibility of a continuing infection. Specifically with Pseudomonas. For that reason I will go ahead and send in a prescription for Cipro for her to be continued. 11/14/2020 upon evaluation today patient appears to be doing very well currently in regard to her wound on  her leg. She has been tolerating the dressing changes without complication. Fortunately I feel like the infection is finally under good control here. Unfortunately we do not have the Apligraf for application today although we can definitely order to have it in place for next week. That will be her fifth and final of the current series. Nonetheless I feel like her wound is really doing quite well which is great news. 11/21/2020 upon evaluation today patient appears to be doing well with regard to her wound on the medial ankle. Fortunately I think the infection is under control and I do believe we can go ahead and reapply the Apligraf today. She is in agreement with that plan. There does not appear to be any signs of active infection at this time which is great news. No fevers, chills, nausea, vomiting, or diarrhea. 12/05/2020 upon evaluation today patient's wound bed actually showed signs of good granulation epithelization at this point. There does not appear to be any signs of infection which is great news and overall very pleased with where things stand. Overall the patient seems to be doing fairly well in my opinion with regard to her wound although I do believe she continues to build up a lot of biofilm I think she could benefit from using PuraPly at this point. 12/12/2020 upon evaluation today patient's wound actually appears to be doing decently well today. The Unna boot has not been quite as well-tolerated so that more uncomfortable for her and even causing some pressure over the plate on the lateral portion of her foot which is 90 where the wound is. There did  not appear to be any significant deep tissue injury with that there may be a minimal change in the skin noted I think that we may want to go back to the Coflex 2 layer which is a little bit easier on her skin it seems. Electronic Signature(s) Signed: 12/12/2020 1:42:33 PM By: [name] Keeler PA-C Entered By: [name] on 12/12/2020 13:42:33 -------------------------------------------------------------------------------- Physical Exam Details Patient Name: Date of Service: [facility] [name], [name] 12/12/2020 10:00 A M Medical Record Number: [id number] Patient Account Number: [id number] Date of Birth/Sex: Treating RN: 05/17/1956 (65 y.o. [name] Primary Care Provider: [name] Other Clinician: Referring Provider: Treating Provider/Extender: [name] in Treatment: 6 Constitutional Well-nourished and well-hydrated in no acute distress. Respiratory normal breathing without difficulty. Psychiatric this patient is able to make decisions and demonstrates good insight into disease process. Alert and Oriented x 3. pleasant and cooperative. Notes Upon inspection patient's wound bed actually showed signs of fairly good granulation. There does not appear to be any significant slough buildup which is great news and overall pleased in that regard. No fevers, chills, nausea, vomiting, or diarrhea. I did not have to performed sharp debridement. However I did apply the PuraPly to the patient's wound bed. She tolerated the application today without complication this was secured with Adaptic and Steri-Strips. Electronic Signature(s) Signed: 12/12/2020 1:43:25 PM By: [name] Keeler PA-C Entered By: [name] on 12/12/2020 13:43:24 -------------------------------------------------------------------------------- Physician Orders Details Patient Name: Date of Service: [name], [name] 12/12/2020 10:00 A M Medical Record Number: [id number] Patient Account  Number: [id number] Date of Birth/Sex: Treating RN: 06-Apr-1956 (65 y.o. [name] Primary Care Provider: [name] Other Clinician: Referring Provider: Treating Provider/Extender: [name] in Treatment: 84 Verbal / Phone Orders: No Diagnosis Coding ICD-10 Coding Code Description [phone]  Chronic venous hypertension (idiopathic) with ulcer and inflammation of left lower extremity L97.828 Non-pressure chronic ulcer of other part of left lower leg with other specified severity L97.328 Non-pressure chronic ulcer of left ankle with other specified severity Follow-up Appointments ppointment in 1 week. - with Margarita Grizzle Return A Cellular or Tissue Based Products Cellular or Tissue Based Product Type: - PuraPly: #1 12/12/20 Bathing/ Shower/ Hygiene May shower with protection but do not get wound dressing(s) wet. Edema Control - Lymphedema / SCD / Other Elevate legs to the level of the heart or above for 30 minutes daily and/or when sitting, a frequency of: - throughout the day Avoid standing for long periods of time. Exercise regularly Compression stocking or Garment 20-30 mm/Hg pressure to: - right leg daily Additional Orders / Instructions Follow Nutritious Diet Wound Treatment Wound #15 - Malleolus Wound Laterality: Left, Medial Cleanser: Soap and Water 1 x Per Week/30 Days Discharge Instructions: May shower and wash wound with dial antibacterial soap and water prior to dressing change. Peri-Wound Care: Triamcinolone 15 (g) 1 x Per Week/30 Days Discharge Instructions: Use triamcinolone 15 (g) mixed with lotion Peri-Wound Care: Sween Lotion (Moisturizing lotion) 1 x Per Week/30 Days Discharge Instructions: Apply moisturizing lotion as directed Secondary Dressing: Woven Gauze Sponge, Non-Sterile 4x4 in 1 x Per Week/30 Days Discharge Instructions: Apply over primary dressing as directed. Secondary Dressing: ABD Pad, 5x9 1 x Per Week/30 Days Discharge  Instructions: Apply over primary dressing and lateral ankle Secondary Dressing: ADAPTIC TOUCH 3x4.25 in 1 x Per Week/30 Days Discharge Instructions: Apply over skin sub Compression Wrap: CoFlex TLC XL 2-layer Compression System 4x7 (in/yd) 1 x Per Week/30 Days Discharge Instructions: Apply CoFlex 2-layer compression as directed. (alt for 4 layer) Electronic Signature(s) Signed: 12/12/2020 5:19:59 PM By: [name] Keeler PA-C Signed: 12/12/2020 6:05:01 PM By: [name] Entered By: [name] on 12/12/2020 10:56:38 -------------------------------------------------------------------------------- Problem List Details Patient Name: Date of Service: [name], [name] 12/12/2020 10:00 A M Medical Record Number: [id number] Patient Account Number: [id number] Date of Birth/Sex: Treating RN: [birth year] (65 y.o. [name] Primary Care Provider: [name] Other Clinician: Referring Provider: Treating Provider/Extender: [name] in Treatment: 50 Active Problems ICD-10 Encounter Code Description Active Date MDM Diagnosis I87.332 Chronic venous hypertension (idiopathic) with ulcer and inflammation of left 01/17/2020 No Yes lower extremity L97.828 Non-pressure chronic ulcer of other part of left lower leg with other specified 01/17/2020 No Yes severity L97.328 Non-pressure chronic ulcer of left ankle with other specified severity 01/17/2020 No Yes Inactive Problems ICD-10 Code Description Active Date Inactive Date L03.116 Cellulitis of left lower limb 01/31/2020 01/31/2020 T81.31XD Disruption of external operation (surgical) wound, not elsewhere classified, subsequent 01/17/2020 01/17/2020 encounter Resolved Problems Electronic Signature(s) Signed: 12/12/2020 5:19:59 PM By: [name] Keeler PA-C Signed: 12/12/2020 6:05:01 PM By: [name] Previous Signature: 12/12/2020 10:13:12 AM Version By: [name] Keeler PA-C Entered By: [name] on  12/12/2020 10:45:40 -------------------------------------------------------------------------------- Progress Note Details Patient Name: Date of Service: [name], [name] 12/12/2020 10:00 A M Medical Record Number: [id number] Patient Account Number: [id number] Date of Birth/Sex: Treating RN: 1955-12-28 (65 y.o. [name] Primary Care Provider: [name] Other Clinician: Referring Provider: Treating Provider/Extender: [name] in Treatment: 90 Subjective Chief Complaint Information obtained from Patient patient is been followed long-term in this clinic for venous insufficiency ulcers with inflammation, hypertension and ulceration over the medial ankle bilaterally. 01/17/2020; this is a patient who  is here for review of postoperative wounds on the left lateral ankle and recurrence of venous stasis ulceration on the left medial History of Present Illness (HPI) the remaining wound is over the left medial ankle. Similar wound over the right medial ankle healed largely with use of Apligraf. Most recently we have been using Hydrofera Blue over this wound with considerable improvement. The patient has been extensively worked up in the past for her venous insufficiency and she is not a candidate for antireflux surgery although I have none of the details available currently. 08/24/14; considerable improvement today. About 50% of this wound areas now epithelialized. The base of the wound appears to be healthier granulation.as opposed to last week when she had deteriorated a considerable improvement 08/17/14; unfortunately the wound has regressed somewhat. The areas of epithelialization from the superior aspect are not nearly as healthy as they were last week. The patient thinks her Hydrofera Blue slipped. 09/07/14; unfortunately the area has markedly regressed in the 2 weeks since I've seen this. There is an odor surrounding erythema. The healthy  granulation tissue that we had at the base of the wound now is a dusky color. The nurse reports green drainage 09/14/14; the area looks somewhat better than last week. There is less erythema and less drainage. The culture I did did not show any growth. Nevertheless I think it is better to continue the Cipro and doxycycline for a further week. The remaining wound area was debridement. 09/21/14. Wound did not require debridement last week. Still less erythema and less drainage. She can complete her antibiotics. The areas of epithelialization in the superior aspect of the wound do not look as healthy as they did some weeks ago 10/05/14 continued improvement in the condition of this wound. There is advancing epithelialization. Less aggressive debridement required 10/19/14 continued improvement in the condition and volume of this wound. Less aggressive debridement to the inferior part of this to remove surface slough and fibrinous eschar 11/02/14 no debridement is required. The surface granulation appears healthy although some of her islands of epithelialization seem to have regressed. No evidence of infection 11/16/14; lites surface debridement done of surface eschar. The wound does not look to be unhealthy. No evidence of infection. Unfortunately the patient has had podiatry issues in the right foot and for some reason has redeveloped small surface ulcerations in the medial right ankle. Her original presentation involved wounds in this area 11/23/14 no debridement. The area on the right ankle has enlarged. The left ankle wound appears stable in terms of the surface although there is periwound inflammation. There has been regression in the amount of new skin 11/30/14 no debridement. Both wound areas appear healthy. There was no evidence of infection. The the new area on the right medial ankle has enlarged although that both the surfaces appear to be stable. 12/07/14; Debridement of the right medial ankle wound. No  no debridement was done on the left. 12/14/14 no major change in and now bilateral medial ankle wounds. Both of these are very painful but the no overt evidence of infection. She has had previous venous ablation 12/21/14; patient states that her right medial ankle wound is considerably more painful last week than usual. Her left is also somewhat painful. She could not tolerate debridement. The right medial ankle wound has fibrinous surface eschar 12/28/14 this is a patient with severe bilateral venous insufficiency ulcers. For a considerable period of time we actually had the one on the right medial ankle healed however this  recently opened up again in June. The left medial ankle wound has been a refractory area with some absent flows. We had some success with Hydrofera Blue on this area and it literally closed by 50% however it is recently opened up Foley. Both of these were debridement today of surface eschar. She tolerates this poorly 01/25/15: No change in the status of this. Thick adherent escar. Very poor tolerance of any attempt at debridement. I had healed the right medial malleolus wound for a considerable amount of time and had the left one down to about 50% of the volume although this is totally regressed over the last 48 weeks. Further the right leg has reopened. she is trying to make a appointment with pain and vascular, previous ablations with Dr. Aleda Grana. I do not believe there is an arterial insufficiency issue here 02/01/15 the status of the adherent eschar bilaterally is actually improved. No debridement was done. She did not manage to get vascular studies done 02/08/15 continued debridement of the area was done today. The slough is less adherent and comes off with less pressure. There is no surrounding infection peripheral pulses are intact 02/15/15 selective debridement with a disposable curette. Again the slough is less adherent and comes off with less difficulty. No surrounding  infection peripheral pulses are intact. 02/22/15 selective debridement of the right medial ankle wound. Slough comes off with less difficulty. No obvious surrounding infection peripheral pulses are intact I did not debridement the one on the left. Both of these are stable to improved 03/01/15 selective debridement of both wound areas using a curette to. Adherent slough cup soft with less difficulty. No obvious surrounding infection. The patient tells me that 2 days ago she noted a rash above the right leg wrap. She did not have this on her lower legs when she change this over she arrives with widespread left greater than right almost folliculitis-looking rash which is extremely pruritic. I don't see anything to culture here. There is no rash on the rest of her body. She feels well systemically. 03/08/15; selective debridement of both wounds using a curette. Base of this does not look unhealthy. She had limegreen drainage coming out of the left leg wound and describes a lot of drainage. The rash on her left leg looks improved to. No cultures were done. 03/22/15; patient was not here last week. Basal wounds does not look healthy and there is no surrounding erythema. No drainage. There is still a rash on the left leg that almost looks vasculitic however it is clearly limited to the top of where the wrap would be. 04/05/15; on the right required a surgical debridement of surface eschar and necrotic subcutaneous tissue. I did not debridement the area on the left. These continue to be large open wounds that are not changing that much. We were successful at one point in healing the area on the right, and at the same time the area on the left was roughly half the size of current measurements. I think a lot of the deterioration has to do with the prolonged time the patient is on her feet at work 04/19/15 I attempted-like surface debridement bilaterally she does not tolerate this. She tells me that she was in  allergic care yesterday with extreme pain over her left lateral malleolus/ankle and was told that she has an "sprain" 05/03/15; large bilateral venous insufficiency wounds over the medial malleolus/medial aspect of her ankles. She complains of copious amounts of drainage and his usual large amounts  of pain. There is some increasing erythema around the wound on the right extending into the medial aspect of her foot to. historically she came in with these wounds the right one healed and the left one came down to roughly half its current size however the right one is reopened and the left is expanded. 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 05/10/15 large bilateral venous insufficiency wounds. There is less adherence surface left however the surface culture that I did last week grew pseudomonas therefore bilateral selective debridement score necessary. There is surrounding erythema. The patient describes severe bilateral drainage and a lot of pain in the left ankle. Apparently her podiatrist was were ready to do a cortisone shot 05/17/15; the patient complains of pain and again copious amounts of drainage. 05/24/15; we used Iodo flex last week. Patient notes considerable improvement in wound drainage. Only needed to change this once. 05/31/15; we continued Iodoflex; the base of these large wounds bilaterally is not too bad but there is probably likely a significant bioburden here. I would like to debridement just doesn't tolerate it. 06/06/14 I would like to continue the Iodoflex although she still hasn't managed to obtain supplies. She has bilateral medial malleoli or large wounds which are mostly superficial. Both of them are covered circumferentially with some nonviable fibrinous slough although she tolerates debridement very poorly. She apparently has an appointment for an ablation on the right leg by interventional radiology. 06/14/15; the patient  arrives with the wounds and static condition. We attempted a debridement although she does not do well with this secondary to pain. I 07/05/15; wounds are not much smaller however there appears to be a cleaner granulating base. The left has tight fibrinous slough greater than the right. Debridement is tolerated poorly due to pain. Iodoflex is done more for these wounds in any of the multitude of different dressings I have tried on the left 1 and then subsequently the right. 07/12/15; no change in the condition of this wound. I am able to do an aggressive debridement on the right but not the left. She simply cannot tolerate it. We have been using Iodoflex which helps somewhat. It is worthwhile remembering that at one point we healed the right medial ankle wound and the left was about 25% of the current circumference. We have suggested returning to vascular surgery for review of possible further ablations for one reason or another she has not been able to do this. 07/26/15 no major change in the condition of either wound on her medial ankle. I did not attempt to debridement of these. She has been aggressively scrubbing these while she is in the shower at home. She has her supply of Iodoflex which seems to have done more for these wounds then anything I have put on recently. 08/09/15 wound area appears larger although not verified by measurements. Using Iodoflex 09/05/2015 -- she was here for avisit today but had significant problems with the wound and I was asked to see her for a physician opinion. I have summarize that this lady has had surgery on her left lower extremity about 10 years ago where the possible veins stripping was done. She has had an opinion from interventional radiology around November 2016 where no further sclerotherapy was ordered. The patient works 12 hours a day and stands on a concrete floor with work boots and is unable to get the proper compression she requires and  cannot elevate her  limbs appropriately at any given time. She has recently grown Pseudomonas from her wound culture but has not started her ciprofloxacin which was called in for her. 09/13/15 this continues to be a difficult situation for this patient. At one point I had this wound down to a 1.5 x 1.5" wound on her left leg. This is deteriorated and the right leg has reopened. She now has substantial wounds on her medial calcaneus, malleoli and into her lower leg. One on the left has surface eschar but these are far too painful for me to debridement here. She has a vascular surgery appointment next week to see if anything can be done to help here. I think she has had previous ablations several years ago at [facility]. She has no major edema. She tells me that she did not get product last time [facility]] and went for several days without it. She continues to work in work boots 12 hours a day. She cannot get compression/4-layer under her work boots. 09/20/15 no major change. Periwound edema control was not very good. Her point with pain and vascular is next Wednesday the 25th 09/28/15; the patient is seen vascular surgery and is apparently scheduled for repeat duplex ultrasounds of her bilateral lower legs next week. 10/05/15; the patient was seen by Dr. Doren Custard of vascular surgery. He feels that she should have arterial insufficiency excluded as cause/contributed to her nonhealing stage she is therefore booked for an arteriogram. She has apparently monophasic signals in the dorsalis pedis pulses. She also of course has known severe chronic venous insufficiency with previous procedures as noted previously. I had another long discussion with the patient today about her continuing to work 12 hour shifts. I've written her out for 2 months area had concerns about this as her work location is currently undergoing significant turmoil and this may lead to her termination. She is aware of this however I agree with her that  she simply cannot continue to stand for 12 hours multiple days a week with the substantial wound areas she has. 10/19/15; the Dr. Doren Custard appointment was largely for an arteriogram which was normal. She does not have an arterial issue. He didn't make a comment about her chronic venous insufficiency for which she has had previous ablations. Presumably it was not felt that anything additional could be done. The patient is now out of work as I prescribed 2 weeks ago. Her wounds look somewhat less aggravated presumably because of this. I felt I would give debridement another try today 10/25/15; no major change in this patient's wounds. We are struggling to get her product that she can afford into her own home through her insurance. 11/01/15; no major change in the patient's wounds. I have been using silver alginate as the most affordable product. I spoke to Dr. Marla Roe last week with her requested take her to the OR for surgical debridement and placement of ACEL. Dr. Marla Roe told me that she would be willing to do this however [name] will not cover this, fortunately the patient has [place] healthcare of some variant 11/08/15; no major change in the patient's wounds. She has been completely nonviable surface that this but is in too much pain with any attempted debridement are clinic. I have arranged for her to see Dr. Marla Roe ham of plastic surgery and this appointment is on Monday. I am hopeful that they will take her to the OR for debridement, possible ACEL ultimately possible skin graft  11/22/15 no major change in the patient's wounds over her bilateral medial calcaneus medial malleolus into the lower legs. Surface on these does not look too bad however on the left there is surrounding erythema and tenderness. This may be cellulitis or could him sleepy tinea. 11/29/15; no major changes in the patient's wounds over her bilateral medial malleolus. There is no infection here and I don't think  any additional antibiotics are necessary. There is now plan to move forward. She sees Dr. Marla Roe in a week's time for preparation for operative debridement and ACEL placement I believe on 7/12. She then has a follow-up appointment with Dr. Marla Roe on 7/21 12/28/15; the patient returns today having been taken to the [facility] by Dr. Marla Roe 12/12/15 she underwent debridement, intraoperative cultures [which were negative]. She had placement of a wound VAC. Parent really ACEL was not available to be placed. The wound VAC foam apparently adhered to the wound since then she's been using silver alginate, Xeroform under Ace wraps. She still says there is a lot of drainage and a lot of pain 01/31/16; this is a patient I see monthly. I had referred her to Dr. Marla Roe him of plastic surgery for large wounds on her bilateral medial ankles. She has been to the OR twice once in early July and once in early August. She tells me over the last 3 weeks she has been using the wound VAC with ACEL underneath it. On the right we've simply been using silver alginate. Under Kerlix Coban wraps. 02/28/16; this is a patient I'm currently seeing monthly. She is gone on to have a skin graft over her large venous insufficiency ulcer on the left medial ankle. This was done by Dr. Marla Roe him. The patient is a bit perturbed about why she didn't have one on her right medial ankle wound. She has been using silver alginate to this. 03/06/16; I received a phone call from her plastic surgery Dr. Marla Roe. She expressed some concern about the viability of the skin graft she did on the left medial ankle wound. Asked me to place Endoform on this. She told me she is not planning to do a subsequent skin graft on the right as the left one did not take very well. I had placed Hydrofera Blue on the right 03/13/16; continue to have a reasonably healthy wound on the right medial ankle. Down to 3 mm in terms of size. There is  epithelialization here. The area on the left medial ankle is her skin graft site. I suppose the last week this looks somewhat better. She has an open area inferiorly however in the [name] there appears to be some viable tissue. There is a lot of surface callus and eschar that will eventually need to come off however none of this looked to be infected. Patient states that the is able to keep the dressing on for several days which is an improvement. 03/20/16 no major change in the circumference of either wound however on the left side the patient was at Dr. Eusebio Friendly office and they did a debridement of left wound. 50% of the wound seems to be epithelialized. I been using Endoform on the left Hydrofera Blue in the right 03/27/16; she arrives today with her wound is not looking as healthy as they did last week. The area on the right clearly has an adherent surface to this a very similar surface on the left. Unfortunately for this patient this is all too familiar problem. Clearly the Endoform is not  working and will need to change that today that has some potential to help this surface. She does not tolerate debridement in this clinic very well. She is changing the dressing wants 04/03/16; patient arrives with the wounds looking somewhat better especially on the right. Dr. Migdalia Dk change the dressing to silver alginate when she saw her on Monday and also sold her some compression socks. The usefulness of the latter is really not clear and woman with severely draining wounds. 04/10/16; the patient is doing a bit of an experiment wearing the compression stockings that Dr. Migdalia Dk provided her to her left leg and the out of legs based dressings that we provided to the right. 05/01/16; the patient is continuing to wear compression stockings Dr. Migdalia Dk provided her on the left that are apparently silver impregnated. She has been using Iodoflex to the right leg wound. Still a moderate amount of drainage, when she  leaves here the wraps only last for 4 days. She has to change the stocking on the left leg every night 05/15/16; she is now using compression stockings bilaterally provided by Dr. Marla Roe. She is wearing a nonadherent layer over the wounds so really I don't think there is anything specific being done to this now. She has some reduction on the left wound. The right is stable. I think all healing here is being done without a specific dressing 06/09/16; patient arrives here today with not much change in the wound certainly in diameter to large circular wounds over the medial aspect of her ankle bilaterally. Under the light of these services are certainly not viable for healing. There is no evidence of surrounding infection. She is wearing compression stockings with some sort of silver impregnation as prescribed by Dr. Marla Roe. She has a follow-up with her tomorrow. 06/30/16; no major change in the size or condition of her wounds. These are still probably covered with a nonviable surface. She is using only her purchase stockings. She did see Dr. Marla Roe who seemed to want to apply Dakin's solution to this I'm not extreme short what value this would be. I would suggest Iodoflex which she still has at home. 07/28/16; I follow Mrs. Trotter episodically along with Dr. Marla Roe. 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 She has been applying a topical collagen ointment to both wounds with Adaptic. I don't think Dr. Marla Roe is planning to take her back to the OR. 08/19/16; I follow Mrs. Jeneen Rinks on a monthly basis along with Dr. Marla Roe of plastic surgery. 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 I been following her for a number of years. At one point I was able to get the right medial malleolus wound to heal and had the left medial malleolus down to about half its  current size however and I had to send her to plastic surgery for an operative debridement. Since then things have been stable to slightly improve the area on the right is slightly better one in the left about the same although there is much less adherent surface than I'm used to with this patient. She is using some form of liquid collagen gel that Dr. Marla Roe provided a Kerlix cover with the patient's own pressure stockings. She tells me that she has extreme pain in both ankles and along the lateral aspect of both feet. She has been unable to work for some period of time. She is telling me  she is retiring at the beginning of April. She sees Dr. Doran Durand of orthopedics next week 09/22/16; patient has not seen Dr. Marla Roe since the last time she is here. I'm not really sure what she is using to the wounds other than bits and pieces of think she had left over including most recently Hydrofera Blue. She is using juxtalite stockings. She is having difficulty with her husband's recent illness "stroke". She is having to transport him to various doctors appointments. Dr. Marla Roe left her the option of a repeat debridement with ACEL however she has not been able to get the time to follow-up on this. She continues to have a fair amount of drainage out of these wounds with certainly precludes leaving dressings on all week 10/13/16; patient has not seen Dr. Marla Roe since she was last in our clinic. I'm not really sure what she is doing with the wounds, we did try to get her [name] and I think she is actually using this most of the time. Because of drainage she states she has to change this every second day although this is an improvement from what she used to do. She went to see Dr. Doran Durand who did not think she had a muscular issue with regards to her feet, he referred her to a neurologist and I think the appointment is sometime in June. I changed her back to Iodoflex which she has used in the past  but not recently. 11/03/16; the patient has been using Iodoflex although she ran out of this. Still claims that there is a lot of drainage although the wound does not look like this. No surrounding erythema. She has not been back to see Dr. Marla Roe 11/24/16; the patient has been using Iodoflex again but she ran out of it 2 or 3 days ago. There is no major change in the condition of either one of these wounds in fact they are larger and covered in a thick adherent surface slough/nonviable tissue especially on the left. She does not tolerate mechanical debridement in our clinic. Going back to see Dr. Marla Roe of plastic surgery for an operative debridement would seem reasonable. 12/15/16; the patient has not been back to see Dr. Marla Roe. She is been dealing with a series of illnesses and her husband which of monopolized her time. She is been using Sorbact which we largely supplied. She states the drainage is bad enough that it maximum she can go 2-3 days without changing the dressing 01/12/2017 -- the patient has not been back for about 4 weeks and has not seen Dr. Marla Roe not does she have any appointment pending. 01/23/17; patient has not seen Dr. Marla Roe even though I suggested this previously. She is using Santyl that was suggested last week by Dr. Con Memos this Cost her $16 through her insurance which is indeed surprising 02/12/17; continuing Santyl and the patient is changing this daily. A lot of drainage. She has not been back to see plastic surgery she is using an Ace wrap. Our intake nurse suggested wrap around stockings which would make a good reasonable alternative 02/26/17; patient is been using Santyl and changing this daily due to drainage. She has not been to see plastic surgery she uses in April Ace wrap to control the edema. She did obtain extremitease stockings but stated that the edema in her leg was to big for these 03/20/17; patient is using Santyl and Anasept. Surfaces looked  better today the area on the right is actually measuring a little smaller.  She has states she has a lot of pain in her feet and ankles and is asking for a consult to pain control which I'll try to help her with through our case manager. 04/10/17; the patient arrives with better-looking wound surfaces and is slightly smaller wound on the left she is using a combination of Santyl and Anasept. She has an appointment or at least as started in the pain control [name] associated with [facility] 05/14/17; this is a patient who I followed for a prolonged period of time. She has venous insufficiency ulcers on her bilateral medial ankles. At one point I had this down to a much smaller wound on the left however these reopened and we've never been able to get these to heal. She has been using Santyl and Anasept gel although 2 weeks ago she ran out of the Anasept gel. She has a stable appearance of the wound. She is going to the wound care clinic at [name]. They wanted do a nerve block/spinal block although she tells me she is reluctant to go forward with that. 05/21/17; this is a patient I have followed for many years. She has venous insufficiency ulcers on her bilateral medial ankles. Chronic pain and deformity in her ankles as well. She is been to see plastic surgery as well as orthopedics. Using PolyMem AG most recently/Kerramax/ABDs and 2 layer compression. She has managed to keep this on and she is coming in for a nurse check to change the dressing on Tuesdays, we see her on Fridays 06/05/17; really quite a good looking surface and the area especially on the right medial has contracted in terms of dimensions. Well granulated healthy-looking tissue on both sides. Even with an open curet there is nothing that even feels abnormal here. This is as good as I've seen this in quite some time. We have been using PolyMem AG and bringing her in for a nurse check 06/12/17; really quite good surface on both  of these wounds. The right medial has contracted a bit left is not. We've been using PolyMem and AG and she is coming in for a nurse visit 06/19/17; we have been using PolyMem AG and bringing her in for a nurse check. Dimensions of her wounds are not better but the surfaces looked better bilaterally. She complained of bleeding last night and the left wound and increasing pain bilaterally. She states her wound pain is more neuropathic than just the wounds. There was some suggestion that this was radicular from her pain management doctor in talking to her it is really difficult to sort this out. 06/26/17; using PolyMem and AG and bringing her in for a nurse check as All of this and reasonably stable condition. Certainly not improved. The dimensions on the lateral part of the right leg look better but not really measuring better. The medial aspect on the left is about the same. 07/03/16; we have been using PolyMen AG and bringing her in for a nurse check to change the dressings as the wounds have drainage which precludes once weekly changing. We are using all secondary absorptive dressings.our intake nurse is brought up the idea of using a wound VAC/snap VAC on the wound to help with the drainage to see if this would result in some contraction. This is not a bad idea. The area on the right medial is actually looking smaller. Both wounds have a reasonable-looking surface. There is no evidence of cellulitis. The edema is well controlled 07/10/17; the patient was denied  for a snap VAC by her insurance. The major issue with these wounds continues to be drainage. We are using wicked PolyMem AG and she is coming in for a nurse visit to change this. The wounds are stable to slightly improved. The surface looks vibrant and the area on the right certainly has shrunk in size but very slowly 07/17/17; the patient still has large wounds on her bilateral medial malleoli. Surface of both of these wounds looks better. The  dimensions seem to come and go but no consistent improvement. There is no epithelialization. We do not have options for advanced treatment products due to insurance issues. They did not approve of the wound VAC to help control the drainage. More recently we've been using PolyMem and AG wicked to allow drainage through. We have been bringing her in for a nurse visit to change this. We do not have a lot of options for wound care products and the home again due to insurance issues 07/24/17; the patient's wound actually looks somewhat better today. No drainage measurements are smaller still healthy-looking surface. We used silver collagen under PolyMen started last week. We have been bringing her in for a dressing change 07/31/17; patient's wound surface continued to look better and I think there is visible change in the dimensions of the wound on the right. Rims of epithelialization. We have been using silver collagen under PolyMen and bringing her in for a dressing change. There appears to be less drainage although she is still in need of the dressing change 08/07/17. Patient's wound surface continues to look better on both sides and the area on the right is definitely smaller. We have been using silver collagen and PolyMen. She feels that the drainage has been it has been better. I asked her about her vascular status. She went to see Dr. Aleda Grana at [facility] and had some form of ablation. I don't have much detail on this. I haven't my notes from 2016 that she was not a candidate for any further ablation but I don't have any more information on this. We had referred her to vein and vascular I don't think she ever went. He does not have a history of PAD although I don't have any information on this either. We don't even have ABIs in our record 08/14/17; we've been using silver collagen and PolyMen cover. And putting the patient and compression. She we are bringing her in as a nurse visit to  change this because ofarge amount of drainage. We didn't the ABIs in clinic today since they had been done in many moons 1.2 bilaterally. She has been to see vein and vascular however this was at [facility] and she had ablation although I really don't have any information on this all seemed biking get a report. She is also been operatively debrided by plastic surgery and had a cell placed probably 8-12 months ago. This didn't have a major effect. We've been making some gains with current dressings 08/19/17-She is here in follow-up evaluation for bilateral medial malleoli ulcers. She continues to tolerate debridement very poorly. We will continue with recently changed topical treatment; if no significant improvement may consider switching to Iodosorb/Iodoflex. 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 She has been using silver collagen and PolyMem. I believe she has been used and tried on Iodoflex before. During her trip to the clinic we've been watching her wound with Anasept spray and I would like to encourage this on  thenurse visit days 09/04/17 bilateral medial malleoli ulcers area is her chronic related to chronic venous insufficiency. These have been very refractory over time. We have been using silver collagen and PolyMen. She is coming in once a week for a doctor's and once a week for nurse visits. We are actually making some progress 09/18/17; the patient's wounds are smaller especially on the right medial. She arrives today to upset to consider even washing these off with Anasept which I think is been part of the reason this is been closing. We've been using collagen covered in PolyMen otherwise. It is noted that she has a small area of folliculitis on the right medial calf that. As we are wrapping her legs I'll give her a short course of doxycycline to make sure this doesn't amount to anything. She is a long list of complaints today including  imbalance, shortness of breath on exertion, inversion of her left ankle. With regards to the latter complaints she is been to see orthopedics and they offered her a tendon release surgery I believe but wanted her wounds to be closed first. I have recommended she go see her primary physician with regards to everything else. 09/25/17; patient's wounds are about the same size. We have made some progress bilaterally although not in recent weeks. She will not allow me T wash these wounds with Anasept even if she is doing her cell. Wheeze we've been using collagen covered in PolyMen. Last week she had a small area of folliculitis this is now opened into a small wound. She completed 5 days of trimethoprim sulfamethoxazole 10/02/17; unfortunately the area on her left medial ankle is worse with a larger wound area towards the Achilles. The patient complains of a lot of pain. She will not allow debridement although visually I don't think there is anything to debridement in any case. We have been using silver collagen and PolyMen for several months now. Initially we are making some progress although I'm not really seeing that today. We will move back to [facility]. His admittedly this is a bit of a repeat however I'm hoping that his situation is different now. The patient tells me she had her leg on the left give out on her yesterday this is process some pain. 10/09/17; the patient is seen twice a week largely because of drainage issues coming out of the chronic medial bimalleolar wounds that are chronic. Last week the dimensions of the one on the left looks a little larger I changed her to [facility]. She comes in today with a history of terrible pain in the bilateral wound areas. She will not allow debridement. She will not even allow a tissue culture. There is no surrounding erythema no no evidence of cellulitis. We have been putting her Kerlix Coban man. She will not allow more aggressive compression as  there was a suggestion to put her in 3 layer wraps. 10/16/17; large wounds on her bilateral medial malleoli. These are chronic. Not much change from last week. The surface looks have healthy but absolutely no epithelialization. A lot of pain little less so of drainage. She will not allow debridement or even washing these off in the vigorous fashion with Anasept. 10/23/17; large wounds on her bilateral malleoli which are chronic. Some improvement in terms of size perhaps on the right since last time I saw these. She states that after we increased the 3 layer compression there was some bleeding, when she came in for a nurse visit she did  not want 3 layer compression put back on about our nurse managed to convince her. She has known chronic venous visit issues and I'm hoping to get her to tolerate the 3 layer compression. using Hydrofera Blue 10/30/17; absolutely no change in the condition of either wound although we've had some improvement in dimensions on the right.. Attempted to put her in 3 layer compression she didn't tolerated she is back in 2 layer compression. We've been using Hydrofera Blue We looked over her past records. She had venous reflux studies in November 2016. There was no evidence of deep venous reflux on the right. Superficial vein did not show the greater saphenous vein at think this is been previously ablated the small saphenous vein was within normal limits. The left deep venous system showed no DVT the vessels were positive for deep venous reflux in the posterior tibial veins at the ankle. The greater saphenous vein was surgically absent small saphenous vein was within normal limits. She went to vein and vascular at [facility]. I believe she had an ablation on the left greater saphenous vein. I'll update her reflux studies perhaps ever reviewed by vein and vascular. We've made absolutely no progress in these wounds. Will also try to read and TheraSkins through her  insurance 11/06/17; W the patient apparently has a 2 week follow-up with vein and vascular I like him to review the whole issue with regards to her previous vascular workup by Dr. Aleda Grana. We've really made no progress on these wounds in many months. She arrives today with less viable looking surface on the left medial ankle wound. This was apparently looking about the same on Tuesday when she was here for nurse visit. 11/13/17; deep tissue culture I did last time of the left lower leg showed multiple organisms without any predominating. In particular no Staphylococcus or group A strep were isolated. We sent her for venous reflux studies. She's had a previous left greater saphenous vein stripping and I think sclerotherapy of the right greater saphenous vein. She didn't really look at the lesser saphenous vein this both wounds are on the medial aspect. She has reflux in the common femoral vein and popliteal vein and an accessory vein on the right and the common femoral vein and popliteal vein on the left. I'm going to have her go to see vein and vascular just the look over things and see if anything else beside aggressive compression is indicated here. We have not been able to make any progress on these wounds in spite of the fact that the surface of the wounds is never look too bad. 11/20/17; no major change in the condition of the wounds. Patient reports a large amount of drainage. She has a lot of complaints of pain although enlisting her today I wonder if some of this at least his neuropathic rather than secondary to her wounds. She has an appointment with vein and vascular on 12/30/17. The refractory nature of these wounds in my mind at least need vein and vascular to look over the wounds the recent reflux studies we did and her history to see if anything further can be done here. I also note her gait is deteriorated quite a bit. 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 She has a  bilateral Trendelenburg gait. I wonder if this is neuropathic or perhaps multilevel radicular. 11/27/17; her wounds actually looks slightly better. Healthy-looking granulation tissue a scant amount of epithelialization. [place] healthcare will not pay for  TheraSkin. They will play for tri layer Oasis and Dermagraft. This is not a diabetic ulcer. We'll try for the tri layer Oasis. She still complains of some drainage. She has a vein and vascular appointment on 12/30/17 12/04/17; the wounds visually look quite good. Healthy-looking granulation with some degree of epithelialization. We are still waiting for response to our request for trial to try layer Oasis. Her appointment with vascular to review venous and arterial issues isn't sold the end of July 7/31. Not allow debridement or even vigorous cleansing of the wound surface. 12/18/17; slightly smaller especially on the right. 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 We've been using Hydrofera Blue. We still are looking into triple layer Oasis through her insurance 01/08/18 on evaluation today patient's wound actually appears to be showing signs of good improvement at this point in time. She has been tolerating the dressing changes without complication. Fortunately there does not appear to be any evidence of infection at this point in time. We have been utilizing silver nitrate which does seem to be of benefit for her which is also good news. Overall I'm very happy with how things seem to be both regards appearance as well as measurement. Patient did see Dr. Bridgett Larsson for evaluation on 12/30/17. In his assessment he felt that stripping would not likely add much more than chronic compression to the patient's healing process. His recommendation was to follow-up in three months with Dr. Doren Custard if she hasn't healed in order to consider referral back to you and see vascular where she previously was in a trial and was able to get her wound to  heal. I'll be see what she feels she when you staying compression and he reiterated this as well. 01/13/18 on evaluation today patient appears to actually be doing very well in regard to her bilateral medial malleolus ulcers. She seems to have tolerated the chemical cauterization with silver nitrate last week she did have some pain through that evening but fortunately states that I'll be see since it seems to be doing better she is overall pleased with the progress. 01/21/18; really quite a remarkable improvement since I've last seen these wounds. We started using silver nitrate specially on the islands of hyper granulation which for some reason her around the wound circumference. This is really done quite nicely. Primary dressing Hydrofera Blue under 4 layer compression. She seems to be able to hold out without a nurse rewrap. Follow-up in 1 week 01/28/18; we've continued the hydrofera blue but continued with chemical cauterization to the wound area that we started about a month ago for irregular hyper granulation. She is made almost stunning improvement in the overall wound dimensions. I was not really expecting this degree of improvement in these chronic wounds 02/05/18; we continue with Hydrofera Bluebut of also continued the aggressive chemical cauterization with silver nitrate. We made nice progress with the right greater than left wound. 02/12/18. We continued with Hydrofera Blue after aggressive chemical cauterization with silver nitrate. We appear to be making nice progress with both wound areas 02/19/2018; we continue with [facility] after washing the wounds vigorously with Anasept spray and chemical cauterization with silver nitrate. We are making excellent progress. The area on the right's just about closed 02/26/2018. The area on the left medial ankle had too much necrotic debris today. I used a #5 curette we are able to get most of the soft. I continued with the silver nitrate to the much  smaller wound on the right  medial ankle she had a new area on her right lower pretibial area which she says was due to a role in her compression 03/05/2018; both wound areas look healthy. Not much change in dimensions from last week. I continue to use silver nitrate and Hydrofera Blue. The patient saw Dr. Doren Custard of vein and vascular. He felt she had venous stasis ulcers. He felt based on her previous arteriogram she should have adequate circulation for healing. Also she has deep venous reflux but really no significant correctable superficial venous reflux at this time. He felt we should continue with conservative management including leg elevation and compression 04/02/2018; since we last saw this woman about a month ago she had a fall apparently suffered a pelvic fracture. I did not look up the x-ray. Nevertheless because of pain she literally was bedbound for 2 weeks and had home health coming out to change the dressing. Somewhat predictably this is resulted in considerable improvement in both wound areas. The right is just about closed on the medial malleolus and the left is about half the size. 04/16/2018; both her wounds continue to go down in size. Using Hydrofera Blue. 05/07/18; both her wounds appeared to be improving especially on the right where it is almost closed. We are using Hydrofera Blue 05/14/2018; slightly worse this week with larger wounds. Surface on the left medial not quite as good. We have been using Hydrofera Blue 05/21/18; again the wounds are slightly larger. Left medial malleolus slightly larger with eschar around the circumference. We have been using Hydrofera Blue undergoing a wraps for a prolonged period of time. This got a lot better when she was more recumbent due to a fall and a back injury. I change the primary dressing the silver alginate today. She did not tolerate a 4 layer compression previously although I may need to bring this up with her next time 05/28/2018; area  on the left medial malleolus again is slightly larger with more drainage. Area on the right is roughly unchanged. She has a small area of folliculitis on the right medial just on the lower calf. This does not look ominous. 06/03/2018 left medial malleolus slightly smaller in a better looking surface. We used silver nitrate on this last time with silver alginate. The area on the right appears slightly smaller 1/10; left medial malleolus slightly smaller. Small open area on the right. We used silver nitrate and silver alginate as of 2 weeks ago. We continue with the wound and compression. These got a lot better when she was off her feet 1/17; right medial malleolus wound is smaller. The left may be slightly smaller. Both surfaces look somewhat better. 1/24; both wounds are slightly smaller. Using silver alginate under Unna boots 1/31; both wounds appear smaller in fact the area on the right medial is just about closed. Surface eschar. We have been using silver alginate under Unna boots. The patient is less active now spends let much less time on her feet and I think this is contributed to the general improvement in the wound condition 2/7; both wounds appear smaller. I was hopeful the right medial would be closed however there there is still the same small open area. Slight amount of surface eschar on the left the dimensions are smaller there is eschar but the wound edges appear to be free. We have been using silver alginate under Unna boot's 2/14; both wounds once again measure smaller. Circumferential eschar on the left medial. We have been using silver alginate under  Unna boots with gradual improvement 2/21; the area on the right medial malleolus has healed. The area on the left is smaller. We have been using silver alginate and Unna boots. We can discharge wrapping the right leg she has 20/30 stockings at home she will need to protect the scar tissue in this area 2/28; the area on the right medial  malleolus remains closed the patient has a compression stocking. The area on the left is smaller. We have been using silver alginate and Unna boots. 3/6 the area on the right medial ankle remains closed. Good edema control noted she is using her own compression stocking. The area on the left medial ankle is smaller. We have been managing this with silver alginate and Unna boots which we will continue today. 3/13; the area on the right medial ankle remains closed and I'm declaring it healed today. When necessary the left is about the same still a healthy-looking surface but no major change and wound area. No evidence of infection and using silver alginate under unna and generally making considerable improvement 3/27 the area on the right medial ankle remains closed the area on the left is about the same as last week. Certainly not any worse we have been using silver alginate under an Unna boot 4/3; the area on the right medial ankle remains closed per the patient. We did not look at this wound. The wound on the left medial ankle is about the same surface looks healthy we have been using silver alginate under an Unna boot 4/10; area on the right medial ankle remains closed per the patient. We did not look at this wound. The wound on the left medial ankle is slightly larger. The patient complains that the [name] caused burning pain all week. She also told us that she was a lot more active this week. Changed her back to silver alginate 4/17; right medial ankle still closed per the patient. Left medial ankle is slightly larger. Using silver alginate. She did not tolerate Hydrofera Blue on this area 4/24; right medial ankle remains closed we have not look at this. The left medial ankle continues to get larger today by about a centimeter. We have been using silver alginate under Unna boots. She complains about 4 layer compression as an alternative. She has been up on her feet working on her  garden 5/8; right medial ankle remains closed we did not look at this. The left medial ankle has increased in size about 100%. We have been using silver alginate under Unna boots. She noted increased pain this week and was not surprised that the wound is deteriorated 5/15; no major change in SA however much less erythema ( one week of doxy ocellulitis). 5/22-65 year old female returns at 1 week to the clinic for left medial ankle wound for which we have been using silver alginate under 3 layer compression She was placed on DOXY at last visit - the wound is wider at this visit. She is in 3 layer compression 5/29; change to [name] last week. I had given her empiric doxycycline 2 weeks ago for a week. She is in 3 layer compression. She complains of a lot of pain and drainage on presentation today. 6/5; using Hydrofera Blue. I gave her doxycycline recently empirically for erythema and pain around the wound. Believe her cultures showed enterococcus which not would not have been well covered by doxycycline nevertheless the wound looks better and I don't feel specifically that the enterococcus needs to  be covered. She has a new what looks like a wrap injury on her lateral left ankle. 6/12; she is using Hydrofera Blue. She has a new area on the left anterior lower tibial area. This was a wrap injury last week. 6/19; the patient is using Hydrofera Blue. She arrived with marked inflammation and erythema around the wound and tenderness. 12/01/18 on evaluation today patient appears to be doing a little bit better based on what I'm hearing from the standpoint of lassos evaluation to this as far as the overall appearance of the wound is concerned. Then sometime substandard she typically sees Dr. Dellia Nims. Nonetheless overall very pleased with the progress that she's made up to this point. No fevers, chills, nausea, or vomiting noted at this time. 7/10; some improvement in the surface area. Aggressively  debrided last week apparently. I went ahead with the debridement today although the patient does not tolerate this very well. We have been using Iodoflex. Still a fair amount of drainage 7/17; slightly smaller. Using Iodoflex. 7/24; no change from last week in terms of surface area. We have been using Iodoflex. Surface looks and continues to look somewhat better 7/31; surface area slightly smaller better looking surface. We have been using Iodoflex. This is under Unna boot compression 8/7-Patient presents at 1 week with Unna boot and Iodoflex, wound appears better 8/14-Patient presents at 1 week with Iodoflex, we use the Unna boot, wound appears to be stable better.Patient is getting Botox treatment for the inversion of the foot for tendon release, Next week 8/21; we are using Iodoflex. Unna boot. The wound is stable in terms of surface area. Under illumination there is some areas of the wound that appear to be either epithelialized or perhaps this is adherent slough at this point I was not really clear. It did not wipe off and I was reluctant to debride this today. 8/28; we are using Iodoflex in an Unna boot. Seems to be making good improvement. 9/4; using Iodoflex and wound is slightly smaller. 9/18; we are using Iodoflex with topical silver nitrate when she is here. The wound continues to be smaller 10/2; patient missed her appointment last week due to GI issues. She left and Iodoflex based dressing on for 2 weeks. Wound is about the same size about the size of a dime on the left medial lower 10/9 we have been using Iodoflex on the medial left ankle wound. She has a new superficial probable wrap injury on the dorsal left ankle 10/16; we have been using Hydrofera Blue since last week. This is on the left medial ankle 10/23; we have been using Hydrofera Blue since 2 weeks ago. This is on the left medial ankle. Dimensions are better 11/6; using Hydrofera Blue. I think the wound is smaller but still  not closed. Left medial ankle 11/13; we have been using Hydrofera Blue. Wound is certainly no smaller this week. Also the surface not as good. This is the remanent of a very large area on her left medial ankle. 11/20; using Sorbact since last week. Wound was about the same in terms of size although I was disappointed about the surface debris 12/11; 3-week follow-up. Patient was on vacation. Wound is measuring slightly larger we have been using Sorbact. 12/18; wound is about the same size however surface looks better last week after debridement. We have been using Sorbact under compression 1/15 wound is probably twice the size of last time increased in length nonviable surface. We have been using Sorbact. She  was running a mild fever and missed her appointment last week 1/22; the wound is come down in size but under illumination still a very adherent debris we have been Hydrofera Blue that I changed her to last week 1/29; dimensions down slightly. We have been using Hydrofera Blue 2/19 dimensions are the same however there is rims of epithelialization under illumination. Therefore more the surface area may be epithelialized 2/26; the patient's wound actually measures smaller. The wound looks healthy. We have been using Hydrofera Blue. I had some thoughts about running Apligraf then I still may do that however this looks so much better this week we will delay that for now 3/5; the wound is small but about the same as last week. We have been using Hydrofera Blue. No debridement is required today. 3/19; the wound is about the size of a dime. Healthy looking wound even under illumination. We have been using Hydrofera Blue. No mechanical debridement is necessary 3/26; not much change from last week although still looks very healthy. We have been using Hydrofera Blue under Unna boots Patient was offered an ankle fusion by podiatry but not until the wound heals with a proceed with this. 4/9; the patient  comes in today with her original wound on the medial ankle looking satisfactory however she has some uncontrolled swelling in the middle part of her leg with 2 new open areas superiorly just lateral to the tibia. I think this was probably a wrap issue. She said she felt uncomfortable during the week but did not call in. We have been using Hydrofera Blue 4/16; the wound on the medial ankle is about the same. She has innumerable small areas superior to this across her mid tibia. I think this is probably folliculitis. She is also been working in the yard doing a lot of sweating 4/30; the patient issue on the upper areas across her mid tibia of all healed. I think this was excessive yard work if I remember. Her wound on the medial ankle is smaller. Some debris on this we have been using Hydrofera Blue under Unna boots 5/7; mid tibia. She has been using Hydrofera Blue under an Unna wrap. She is apparently going for her ankle surgery on June 3 10/28/19-Patient returns to clinic with the ankle wound, we are using Hydrofera Blue under Unna wrap, surgery is scheduled for her left foot for June 3 so she will be back for nurse visit next week READMISSION 01/17/2020 Mrs. Musch is a 65 year old woman we have had in this clinic for a long period of time with severe venous hypertension and refractory wounds on her medial lower legs and ankles bilaterally. 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 When she was off her legs for a prolonged period example when she fell and suffered a compression fracture things would heal up quite nicely. She is now retired and we managed to heal up the right medial leg wound. The left one was very tiny last time I saw this although still refractory. 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 It got to the point where this was interfering with her walking and she elected to proceed with a ankle arthrodesis to straighten her her ankle and leave her with a functional outcome for mobilization. The patient was  referred to Dr. Doren Custard and really this took some time to arrange. Dr. Doren Custard saw her on 12/07/2019. Once again he verified that she had no arterial issues. She had previously had an angiogram several years ago. Follow-up ABIs on the left showed an ABI of 1.12 with triphasic waveforms and a TBI of 0.92. She is felt to have chronic deep venous insufficiency but I do not think it was felt that anything could be done from about this from an ablation point of view. At the time Dr. Doren Custard saw this patient the wounds actually look closed via the pictures in his clinic. The patient finally underwent her surgery on 12/15/2019. This went reasonably well and there was a good anatomic outcome. She developed a small distal wound dehiscence on the lateral part of the surgical wound. However more problematically she is developed recurrence of the wound on the medial left ankle. There are actually 2 wounds here one in the distal lower leg and 1 pretty much at the level of the medial malleolus. It is a more distal area that is more problematic. She has been using Hydrofera Blue which started on Friday before this she was simply Ace wrapping. There was a culture done that showed Pseudomonas and she is on ciprofloxacin. A recent CNS on 8/11 was negative. The patient reports some pain but I generally think this is improving. She is using a cam boot completely nonweightbearing using a walker for pivot transfers and a wheelchair 8/24; not much improvement unfortunately she has a surgical wound on the lateral part in the venous insufficiency wound medially. The bottom part of the medial insufficiency wound is still necrotic there is exposed tendon here. We have been using Hydrofera Blue under  compression. Her edema control is however better 8/31; patient in for follow-up of his surgical wound on the lateral part of her left leg and chronic venous insufficiency ulcers medially. We put her back in compression last week. She comes in today with a complaint of 3 or 4 days worth of increasing pain. She felt her cam walker was rubbing on the area on the back of her heel. However there is intense erythema seems more likely she has cellulitis. She had 2 cultures done when she was seeing podiatry in the postop. One of them in late July showed Pseudomonas and she received a course of ciprofloxacin the other was negative on 8/11 she is allergic to penicillin with anaphylactoid complaints of hives oral swelling via information in epic 9/9; when I saw this patient last week she had intense anterior erythema around her wound on the right lateral heel and ankle and also into the right medial heel. Some of this was no doubt drainage and her walker boot however I was convinced she had cellulitis. I gave her Levaquin and Bactrim she is finishing up on this now. She is following up with Dr. [name] he saw her yesterday. He is taken her out of the walking boot of course she is still nonweightbearing. Her x-ray was negative for any worrisome features such as soft tissue air etc. Things are a lot better this week. She has home health. We have been using Hydrofera Blue under an [employer] which she put back on yesterday. I did not wrap her last week 9/17; her surrounding skin looks a lot better. In fact the area on the left lateral ankle has just a scant amount of eschar. The only remaining wound is the large area on the left medial ankle.  Probably about 60% of this is healthy granulation at the surface however she has a significant divot distally. This has adherent debris in it. I been using debridement and silver collagen to try and get this area to fill-in although I do not think we have made much progress this  week 9/24; the patient's wound on the left medial ankle looks a lot better. The deeper divot area distally still requires debridement but this is cleaning up quite nicely we have been using silver collagen. The patient is complaining of swelling in her foot and is worried that that is contributing to the nonhealing of the ankle wound. She is also complaining of numbness in her anterior toes 10/4; left medial ankle. The small area distally still has a divot with necrotic material that I have been debriding away. This has an undermining area. She is approved for Apligraf. She saw Dr. [name] her surgeon on 10/1. I think he declared himself is satisfied with the condition of things. Still nonweightbearing till the end of the month. 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 Her surgical wound is well closed. There is no evidence of infection 10/11; the patient arrived in clinic today with the expectation that we be able to put an Apligraf on this area after debridement however she arrives with a relatively large amount of green drainage on the dressing. The patient states that this started on Friday. She has not been systemically unwell. 10/19; culture I did last week showed both Enterococcus and Pseudomonas. I think this came in separate parts because I stopped her ciprofloxacin I gave her and prescribed her linezolid however now looking at the final culture result this is Pseudomonas which is resistant to quinolones. She has not yet picked up the linezolid apparently phone issues. We are also trying to get a topical antibiotic out of [place] in [place] they can be applied by home health. She is still having green drainage 10/16; the patient has her topical antibiotic from [facility] in [place]. This is a compounded gel with vancomycin and ciprofloxacin and gentamicin. We are applying this on the wound bed with silver alginate over the top with Unna boot  wraps. She arrives in clinic today with a lot less ominous looking drainage although she is only use this topical preparation once the second time today. She sees Dr. [name] her surgeon on Friday she has home health changing the dressing 11/2; still using her compounded topical antibiotic under silver alginate. Surface is cleaning up there is less drainage. We had an Apligraf for her today and I elected to apply it. A light coating of her antibiotic 04/25/2020 upon evaluation today patient appears to be doing well with regard to her ankle ulcer. There is a little bit of slough noted on the surface of the wound I am can have to perform sharp debridement to clear this away today. With that being said other than that fact overall I feel like she is making progress and we do see some new epithelial growth. There is also some improvement in the depth of the wound and that distal portion. There is little bit of slough there as well. 12/7; 2-week follow-up. Apligraf #3. Dimensions are smaller. Closing in especially inferiorly. Still some surface debris. Still using the [name] topical antibiotic but I told her that I don't think this needs to be renewed 12/21; 2-week follow-up. Apligraf #4 dimensions are smaller. Nice improvement 06/05/2020; 2-week follow-up. The patient's wound on  the left medial ankle looks really excellent. Nice granulation. Advancing epithelialization no undermining no evidence of infection. We would have to reapply for another Apligraf but with the condition of this wound I did not feel strongly about it. We used Hydrofera Blue under the same degree of compression. She follows up with Dr. [name] her surgeon a week Friday 06/13/2020 upon evaluation today patient appears to be doing excellent in regard to her wound. She has been tolerating the dressing changes without complication. Fortunately there is no signs of active infection at this time. No fevers, chills, nausea, vomiting, or  diarrhea. She was using Hydrofera Blue last week. 06/20/2020 06/20/2020 on evaluation today patient actually appears to be doing excellent in regard to her wound. This is measuring better and looking much better as well. She has been using the collagen that seems to be doing better for her as well even though the [name] was and is not sticking or feeling as rough on her wound. She did see Dr. [name] on Friday he is very pleased he also stated none of the hardware has shifted. That is great news 1/27; the patient has a small clean wound all that is remaining. I agree that this is too small to really consider an Apligraf. Under illumination the surface is looking quite good. We have been using collagen although the dimensions are not any better this week 2/2; the patient has a small clean wound on the left medial ankle. Although this left of her substantial original areas. Measurements are smaller. We have been using polymen Ag under an Haematologist. 2/10; small area on the left medial ankle. This looks clean nothing to debride however dimensions are about the same we have been using polymen I think now for 2 weeks 2/17; not much change in surface area. We have been using polymen Ag without any improvement. 3/17; 1 month follow-up. The patient has been using endoform without any improvement in fact I think this looks worse with more depth and more expansion 3/24; no improvement. Perhaps less debris on the surface. We have been using Sorbact for 1 week 4/4; wound measures larger. She has edema in her leg and her foot which she tells as her wrap came down. We have been using Unna boots. Sorbact of the wound. She has been approved for Apligraf 09/12/2020 upon evaluation today patient appears to be doing well with regard to her wound. We did get the Apligraf reapproved which is great news we have that available for application today. Fortunately there is no signs of infection and overall the patient  seems to be doing great. The wound bed is nice and clean. 4/27; patient presents for her second application of Apligraf. She states over the past week she has been on her feet more often due to being outside in her garden. She has noted more swelling to her foot as a result. She denies increased warmth, pain or erythema to the wound site. 10/10/2020 upon evaluation today patient appears to be doing well with regard to her wound which does not appear to be quite as irritated as last week from what I am hearing. With that being said unfortunately she is having issues with some erythema and warmth to touch as well as an increase size. I do believe this likely is infected. 10/17/2020 upon evaluation today patient appears to be doing excellent in regard to her wound this is significantly improved as compared to last week. Fortunately I think that the infection  is much better controlled at this point. She did have evidence of both Enterococcus as well as Staphylococcus noted on culture. Enterococcus really would not be helped significantly by the Cipro but the wound is doing so much better I am under the assumption that the Staphylococcus is probably the main organism that is causing the current infection. Nonetheless I think that she is doing excellent as far as that is concerned and I am very pleased in that regard. I would therefore recommend she continue with the Cipro. 10/31/2020 upon evaluation today patient appears to be doing well with regard to her wound. She has been tolerating the dressing changes without complication. Fortunately there is no signs of active infection and overall I am extremely pleased with where things stand today. No fevers, chills, nausea, vomiting, or diarrhea. With that being said she does have some green drainage coming from the wound and although it looks okay I am a little concerned about the possibility of a continuing infection. Specifically with Pseudomonas. For that reason  I will go ahead and send in a prescription for Cipro for her to be continued. 11/14/2020 upon evaluation today patient appears to be doing very well currently in regard to her wound on her leg. She has been tolerating the dressing changes without complication. Fortunately I feel like the infection is finally under good control here. Unfortunately we do not have the Apligraf for application today although we can definitely order to have it in place for next week. That will be her fifth and final of the current series. Nonetheless I feel like her wound is really doing quite well which is great news. 11/21/2020 upon evaluation today patient appears to be doing well with regard to her wound on the medial ankle. Fortunately I think the infection is under control and I do believe we can go ahead and reapply the Apligraf today. She is in agreement with that plan. There does not appear to be any signs of active infection at this time which is great news. No fevers, chills, nausea, vomiting, or diarrhea. 12/05/2020 upon evaluation today patient's wound bed actually showed signs of good granulation epithelization at this point. There does not appear to be any signs of infection which is great news and overall very pleased with where things stand. Overall the patient seems to be doing fairly well in my opinion with regard to her wound although I do believe she continues to build up a lot of biofilm I think she could benefit from using PuraPly at this point. 12/12/2020 upon evaluation today patient's wound actually appears to be doing decently well today. The Unna boot has not been quite as well-tolerated so that more uncomfortable for her and even causing some pressure over the plate on the lateral portion of her foot which is 90 where the wound is. There did not appear to be any significant deep tissue injury with that there may be a minimal change in the skin noted I think that we may want to go back to the Coflex  2 layer which is a little bit easier on her skin it seems. Objective Constitutional Well-nourished and well-hydrated in no acute distress. Vitals Time Taken: 10:04 AM, Height: 68 in, Temperature: 97.7 F, Pulse: 58 bpm, Respiratory Rate: 20 breaths/min, Blood Pressure: 112/66 mmHg. Respiratory normal breathing without difficulty. Psychiatric this patient is able to make decisions and demonstrates good insight into disease process. Alert and Oriented x 3. pleasant and cooperative. General Notes: Upon inspection patient's wound  bed actually showed signs of fairly good granulation. There does not appear to be any significant slough buildup which is great news and overall pleased in that regard. No fevers, chills, nausea, vomiting, or diarrhea. I did not have to performed sharp debridement. However I did apply the PuraPly to the patient's wound bed. She tolerated the application today without complication this was secured with Adaptic and Steri- Strips. Integumentary (Hair, Skin) Wound #15 status is Open. Original cause of wound was Gradually Appeared. The date acquired was: 12/30/2019. The wound has been in treatment 47 weeks. The wound is located on the Left,Medial Malleolus. The wound measures 3.8cm length x 2.7cm width x 0.1cm depth; 8.058cm^2 area and 0.806cm^3 volume. There is Fat Layer (Subcutaneous Tissue) exposed. There is no tunneling or undermining noted. There is a medium amount of serosanguineous drainage noted. The wound margin is distinct with the outline attached to the wound base. There is large (67-100%) red, pink granulation within the wound bed. There is a small (1-33%) amount of necrotic tissue within the wound bed including Adherent Slough. 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 A skin graft procedure using a bioengineered skin substitute/cellular or tissue based product was performed by [name] Keeler, PA with the following instrument(s): Forceps and Scissors. Puraply was applied and secured with Steri-Strips. 12 sq cm of product was utilized and 0 sq cm was wasted. Post Application, Adaptic was applied. A Time Out was conducted at 10:48, prior to the start of the procedure. The procedure was tolerated well. Post procedure Diagnosis Wound #15: Same as Pre-Procedure . Pre-procedure diagnosis of Wound #15 is a Venous Leg Ulcer located on the Left,Medial Malleolus . There was a Double Layer Compression Therapy Procedure by [name] Jackson, RN. Post procedure Diagnosis Wound #15: Same as Pre-Procedure Plan Follow-up Appointments: Return Appointment in 1 week. - with Margarita Grizzle Cellular or Tissue Based Products: Cellular or Tissue Based Product Type: - PuraPly: #1 12/12/20 Bathing/ Shower/ Hygiene: May shower with protection but do not get wound dressing(s) wet. Edema Control - Lymphedema / SCD / Other: Elevate legs to the level of the heart or above for 30 minutes daily and/or when sitting, a frequency of: - throughout the day Avoid standing for long periods of time. Exercise regularly Compression stocking or Garment 20-30 mm/Hg pressure to: - right leg daily Additional Orders / Instructions: Follow Nutritious Diet WOUND #15: - Malleolus Wound Laterality: Left, Medial Cleanser: Soap and Water 1 x Per Week/30 Days Discharge Instructions: May shower and wash wound with dial antibacterial soap and water prior to dressing change. Peri-Wound Care: Triamcinolone 15 (g) 1 x Per Week/30 Days Discharge Instructions: Use triamcinolone 15 (g) mixed with lotion Peri-Wound Care: Sween Lotion (Moisturizing lotion) 1 x  Per Week/30 Days Discharge Instructions: Apply moisturizing lotion as directed Secondary Dressing: Woven Gauze Sponge, Non-Sterile 4x4 in 1 x Per Week/30 Days Discharge Instructions: Apply over primary dressing as directed. Secondary Dressing: ABD Pad, 5x9 1 x Per Week/30 Days Discharge Instructions: Apply over primary dressing and lateral ankle Secondary Dressing: ADAPTIC TOUCH 3x4.25 in 1 x Per Week/30 Days Discharge Instructions: Apply over skin sub Com pression Wrap: CoFlex TLC XL 2-layer Compression System 4x7 (in/yd) 1  x Per Week/30 Days Discharge Instructions: Apply CoFlex 2-layer compression as directed. (alt for 4 layer) 1. Would recommend currently that we going continue with the wound care measures as before and the patient is in agreement with plan. This includes the use of the PuraPly which we applied the first application of today. 2. I am also can recommend that we continue with the Adaptic over top which I applied as well. 3. We will get a continue with the Coflex 2 layer compression wrap this seems to be much more gentle for her from a skin standpoint. 4. With regard to the foot she is going to be seeing Dr. [name] 2 Fridays from now and see what he has to say in regard to her feeling of somewhat instability in the foot as well as the issue that seems to be present with regard to her prominent plate noted in the foot. Again we will see what he has to say at that point though right now everything seems to be doing okay other than the wrap abdomen pushing on the edge of the plate over the lateral ankle that I found today. I am hopeful switch back to the Coflex wrap will be better than the [employer]. We will see patient back for reevaluation in 1 week here in the clinic. If anything worsens or changes patient will contact our office for additional recommendations. Electronic Signature(s) Signed: 12/12/2020 1:44:56 PM By: [name] Keeler PA-C Entered By: [name] on 12/12/2020  13:44:56 -------------------------------------------------------------------------------- SuperBill Details Patient Name: Date of Service: [name], [name] 12/12/2020 Medical Record Number: [id number] Patient Account Number: [id number] Date of Birth/Sex: Treating RN: [birth year] (64 y.o. [name] Primary Care Provider: [name] Other Clinician: Referring Provider: Treating Provider/Extender: [name] in Treatment: 47 Diagnosis Coding ICD-10 Codes Code Description [phone] Chronic venous hypertension (idiopathic) with ulcer and inflammation of left lower extremity L97.828 Non-pressure chronic ulcer of other part of left lower leg with other specified severity L97.328 Non-pressure chronic ulcer of left ankle with other specified severity Facility Procedures CPT4 Code: [id number] Description: [id number] - SKIN SUB GRAFT TRNK/ARM/LEG ICD-10 Diagnosis Description L97.828 Non-pressure chronic ulcer of other part of left lower leg with other specified s Modifier: everity Quantity: 1 CPT4 Code: [id number] Description: [id number] PuraPly AM 3X4 (12sq. cm) ICD-10 Diagnosis Description L97.828 Non-pressure chronic ulcer of other part of left lower leg with other specified s Modifier: everity Quantity: 12 Physician Procedures : CPT4 Code Description Modifier [id number] - WC PHYS SKIN SUB GRAFT TRNK/ARM/LEG ICD-10 Diagnosis Description L97.828 Non-pressure chronic ulcer of other part of left lower leg with other specified severity Quantity: 1 Electronic Signature(s) Signed: 12/12/2020 1:45:07 PM By: [name] Keeler PA-C Entered By: [name] on 12/12/2020 13:45:07

## 2020-12-17 NOTE — Progress Notes (Signed)
MAISEN, SCHMIT ([id number]) Visit Report for 12/12/2020 Arrival Information Details Patient Name: Date of Service: [facility], [name] 12/12/2020 10:00 A M Medical Record Number: [id number] Patient Account Number: [id number] Date of Birth/Sex: Treating RN: [birth year] (65 y.o. [name] Primary Care Shakeel Disney: [name] Other Clinician: Referring Eloyse Causey: Treating Adaley Kiene/Extender: [name] in Treatment: 56 Visit Information History Since Last Visit Added or deleted any medications: No Patient Arrived: Ambulatory Any new allergies or adverse reactions: No Arrival Time: 10:01 Had a fall or experienced change in No Accompanied By: self activities of daily living that may affect Transfer Assistance: None risk of falls: Patient Identification Verified: Yes Signs or symptoms of abuse/neglect since last visito No Secondary Verification Process Completed: Yes Hospitalized since last visit: No Patient Requires Transmission-Based Precautions: No Implantable device outside of the clinic excluding No Patient Has Alerts: Yes cellular tissue based products placed in the center Patient Alerts: L ABI =1.12, TBI = .92 since last visit: R ABI= 1.02, TBI= .58 Has Dressing in Place as Prescribed: Yes Pain Present Now: Yes Electronic Signature(s) Signed: 12/17/2020 3:49:49 PM By: [name] Entered By: [name] on 12/12/2020 10:03:40 -------------------------------------------------------------------------------- Compression Therapy Details Patient Name: Date of Service: [name], [name] 12/12/2020 10:00 A M Medical Record Number: [id number] Patient Account Number: [id number] Date of Birth/Sex: Treating RN: 04/06/1956 (65 y.o. [name] Primary Care Sayre Mazor: [name] Other Clinician: Referring Mister Krahenbuhl: Treating Jemuel Laursen/Extender: [name] in Treatment: 46 Compression Therapy Performed for  Wound Assessment: Wound #15 Left,Medial Malleolus Performed By: Clinician [name], RN Compression Type: Double Layer Post Procedure Diagnosis Same as Pre-procedure Electronic Signature(s) Signed: 12/12/2020 6:05:01 PM By: [name] Entered By: [name] on 12/12/2020 10:55:55 -------------------------------------------------------------------------------- Encounter Discharge Information Details Patient Name: Date of Service: [name], [name] 12/12/2020 10:00 A M Medical Record Number: [id number] Patient Account Number: [id number] Date of Birth/Sex: Treating RN: 03-19-1956 (65 y.o. [name] Primary Care Dredyn Gubbels: [name] Other Clinician: Referring Regina Ganci: Treating Shanisha Lech/Extender: [name] in Treatment: 57 Encounter Discharge Information Items Post Procedure Vitals Discharge Condition: Stable Temperature (F): 97.7 Ambulatory Status: Cane Pulse (bpm): 58 Discharge Destination: Home Respiratory Rate (breaths/min): 20 Transportation: Private Auto Blood Pressure (mmHg): 112/66 Accompanied By: self Schedule Follow-up Appointment: Yes Clinical Summary of Care: Electronic Signature(s) Signed: 12/12/2020 6:29:13 PM By: Deon Pilling Entered By: Deon Pilling on 12/12/2020 12:30:53 -------------------------------------------------------------------------------- Lower Extremity Assessment Details Patient Name: Date of Service: [facility] [name], [name] 12/12/2020 10:00 A M Medical Record Number: [id number] Patient Account Number: [id number] Date of Birth/Sex: Treating RN: Oct 13, 1955 (65 y.o. [name] Primary Care Adeja Sarratt: [name] Other Clinician: Referring Hilda Rynders: Treating Sanay Belmar/Extender: [name] Weeks in Treatment: 47 Edema Assessment Assessed: [Left: Yes] [Right: No] Edema: [Left: Ye] [Right: s] Calf Left: Right: Point of Measurement: 31 cm From Medial Instep 26  cm Ankle Left: Right: Point of Measurement: 11 cm From Medial Instep 21 cm Vascular Assessment Pulses: Dorsalis Pedis Palpable: [Left:Yes] Electronic Signature(s) Signed: 12/12/2020 6:29:13 PM By: Deon Pilling Entered By: Deon Pilling on 12/12/2020 10:23:02 -------------------------------------------------------------------------------- Multi-Disciplinary Care Plan Details Patient Name: Date of Service: [facility] [name], [name] 12/12/2020 10:00 A M Medical Record Number: [id number] Patient Account Number: [id number] Date of Birth/Sex: Treating RN: 08-03-1955 (65 y.o. [name] Primary Care Musab Wingard: [name] Other Clinician: Referring Darin Redmann: Treating Barrett Goldie/Extender: Clementeen Graham,  Noelle Weeks in Treatment: 47 Multidisciplinary Care Plan reviewed with physician Active Inactive Venous Leg Ulcer Nursing Diagnoses: Actual venous Insuffiency (use after diagnosis is confirmed) Knowledge deficit related to disease process and management Goals: Patient will maintain optimal edema control Date Initiated: 06/20/2020 Target Resolution Date: 01/02/2021 Goal Status: Active Interventions: Assess peripheral edema status every visit. Compression as ordered Treatment Activities: Therapeutic compression applied : 06/20/2020 Notes: Wound/Skin Impairment Nursing Diagnoses: Impaired tissue integrity Knowledge deficit related to ulceration/compromised skin integrity Goals: Patient/caregiver will verbalize understanding of skin care regimen Date Initiated: 01/17/2020 Target Resolution Date: 01/02/2021 Goal Status: Active Ulcer/skin breakdown will have a volume reduction of 30% by week 4 Date Initiated: 01/17/2020 Date Inactivated: 03/12/2020 Target Resolution Date: 03/09/2020 Goal Status: Met Interventions: Assess patient/caregiver ability to obtain necessary supplies Assess patient/caregiver ability to perform ulcer/skin care regimen upon admission and as  needed Assess ulceration(s) every visit Provide education on ulcer and skin care Notes: Electronic Signature(s) Signed: 12/12/2020 6:05:01 PM By: [name] Entered By: [name] on 12/12/2020 10:45:57 -------------------------------------------------------------------------------- Pain Assessment Details Patient Name: Date of Service: [name], [name] 12/12/2020 10:00 A M Medical Record Number: [id number] Patient Account Number: [id number] Date of Birth/Sex: Treating RN: 12-03-1955 (65 y.o. [name] Primary Care Jontrell Bushong: [name] Other Clinician: Referring Jabe Jeanbaptiste: Treating Oluwatobiloba Martin/Extender: [name] in Treatment: 73 Active Problems Location of Pain Severity and Description of Pain Patient Has Paino No Site Locations Pain Management and Medication Current Pain Management: Electronic Signature(s) Signed: 12/12/2020 6:05:01 PM By: [name] Signed: 12/17/2020 3:49:49 PM By: [name] Entered By: [name] on 12/12/2020 10:05:23 -------------------------------------------------------------------------------- Patient/Caregiver Education Details Patient Name: Date of Service: [name], [name] 7/13/2022andnbsp10:00 A M Medical Record Number: [id number] Patient Account Number: [id number] Date of Birth/Gender: Treating RN: [birth year] (65 y.o. [name] Primary Care Physician: [name] Other Clinician: Referring Physician: Treating Physician/Extender: [name] in Treatment: 67 Education Assessment Education Provided To: Patient Education Topics Provided Venous: Methods: Explain/Verbal, Printed Responses: State content correctly Wound/Skin Impairment: Methods: Explain/Verbal, Printed Responses: State content correctly Electronic Signature(s) Signed: 12/12/2020 6:05:01 PM By: [name] Entered By: [name] on 12/12/2020  10:46:28 -------------------------------------------------------------------------------- Wound Assessment Details Patient Name: Date of Service: [name], [name] 12/12/2020 10:00 A M Medical Record Number: [id number] Patient Account Number: [id number] Date of Birth/Sex: Treating RN: 1955/09/03 (65 y.o. [name] Primary Care Emry Tobin: [name] Other Clinician: Referring Renny Gunnarson: Treating Sindia Kowalczyk/Extender: [name] in Treatment: 56 Wound Status Wound Number: 15 Primary Venous Leg Ulcer Etiology: Wound Location: Left, Medial Malleolus Wound Status: Open Wounding Event: Gradually Appeared Comorbid Peripheral Venous Disease, Osteoarthritis, Confinement Date Acquired: 12/30/2019 History: Anxiety Weeks Of Treatment: 47 Clustered Wound: No Photos Wound Measurements Length: (cm) 3.8 Width: (cm) 2.7 Depth: (cm) 0.1 Area: (cm) 8.058 Volume: (cm) 0.806 % Reduction in Area: 30.9% % Reduction in Volume: 91.4% Epithelialization: Small (1-33%) Tunneling: No Undermining: No Wound Description Classification: Full Thickness With Exposed Support Structures Wound Margin: Distinct, outline attached Exudate Amount: Medium Exudate Type: Serosanguineous Exudate Color: red, brown Foul Odor After Cleansing: No Slough/Fibrino Yes Wound Bed Granulation Amount: Large (67-100%) Exposed Structure Granulation Quality: Red, Pink Fascia Exposed: No Necrotic Amount: Small (1-33%) Fat Layer (Subcutaneous Tissue) Exposed: Yes Necrotic Quality: Adherent Slough Tendon Exposed: No Muscle Exposed: No Joint Exposed: No Bone Exposed: No Treatment Notes Wound #15 (Malleolus) Wound Laterality: Left, Medial Cleanser Soap and Water Discharge  Instruction: May shower and wash wound with dial antibacterial soap and water prior to dressing change. Peri-Wound Care Triamcinolone 15 (g) Discharge Instruction: Use triamcinolone 15 (g) mixed with lotion Sween  Lotion (Moisturizing lotion) Discharge Instruction: Apply moisturizing lotion as directed Topical Primary Dressing Secondary Dressing Woven Gauze Sponge, Non-Sterile 4x4 in Discharge Instruction: Apply over primary dressing as directed. ABD Pad, 5x9 Discharge Instruction: Apply over primary dressing and lateral ankle ADAPTIC TOUCH 3x4.25 in Discharge Instruction: Apply over skin sub Secured With Compression Wrap CoFlex TLC XL 2-layer Compression System 4x7 (in/yd) Discharge Instruction: Apply CoFlex 2-layer compression as directed. (alt for 4 layer) Compression Stockings Add-Ons Electronic Signature(s) Signed: 12/13/2020 6:01:46 PM By: [name] Signed: 12/17/2020 3:49:49 PM By: [name] Previous Signature: 12/12/2020 6:05:01 PM Version By: [name] Previous Signature: 12/12/2020 6:29:13 PM Version By: Deon Pilling Entered By: [name] on 12/13/2020 09:01:39 -------------------------------------------------------------------------------- Vitals Details Patient Name: Date of Service: [name], [name] 12/12/2020 10:00 A M Medical Record Number: [id number] Patient Account Number: [id number] Date of Birth/Sex: Treating RN: 1956/03/18 (65 y.o. [name] Primary Care Selam Pietsch: [name] Other Clinician: Referring Dawud Mays: Treating Leya Paige/Extender: [name] in Treatment: 17 Vital Signs Time Taken: 10:04 Temperature (F): 97.7 Height (in): 68 Pulse (bpm): 58 Respiratory Rate (breaths/min): 20 Blood Pressure (mmHg): 112/66 Reference Range: 80 - 120 mg / dl Electronic Signature(s) Signed: 12/17/2020 3:49:49 PM By: [name] Entered By: [name] on 12/12/2020 10:05:08

## 2020-12-18 ENCOUNTER — Ambulatory Visit: Payer: Medicare Other | Admitting: Podiatry

## 2020-12-19 ENCOUNTER — Encounter (HOSPITAL_BASED_OUTPATIENT_CLINIC_OR_DEPARTMENT_OTHER): Payer: Medicare Other | Admitting: Physician Assistant

## 2020-12-19 ENCOUNTER — Other Ambulatory Visit: Payer: Self-pay

## 2020-12-19 DIAGNOSIS — I87332 Chronic venous hypertension (idiopathic) with ulcer and inflammation of left lower extremity: Secondary | ICD-10-CM | POA: Diagnosis not present

## 2020-12-19 NOTE — Progress Notes (Addendum)
MELENDA, WARNING ([id number]) Visit Report for 12/19/2020 Chief Complaint Document Details Patient Name: Date of Service: [facility] [name], [name] 12/19/2020 10:00 A M Medical Record Number: [id number] Patient Account Number: [id number] Date of Birth/Sex: Treating RN: 09/19/1955 (65 y.o. [name] Provider: [name] Other Clinician: Referring Provider: Treating Provider/Extender: [name] in Treatment: 49 Information Obtained from: Patient Chief Complaint patient is been followed long-term in this clinic for venous insufficiency ulcers with inflammation, hypertension and ulceration over the medial ankle bilaterally. 01/17/2020; this is a patient who is here for review of postoperative wounds on the left lateral ankle and recurrence of venous stasis ulceration on the left medial Electronic Signature(s) Signed: 12/19/2020 10:07:13 AM By: [name] PA-C Entered By: [name] on 12/19/2020 10:07:13 -------------------------------------------------------------------------------- Cellular or Tissue Based Product Details Patient Name: Date of Service: [facility] [name], [name] 12/19/2020 10:00 A M Medical Record Number: [id number] Patient Account Number: [id number] Date of Birth/Sex: Treating RN: Mar 09, 1956 (65 y.o. [name] Provider: [name] Other Clinician: Referring Provider: Treating Provider/Extender: [name] in Treatment: 36 Cellular or Tissue Based Product Type Wound #15 Left,Medial Malleolus Applied to: Performed By: Physician [name], PA Cellular or Tissue Based Product Type: Puraply AM Level of Consciousness (Pre-procedure): Awake and Alert Pre-procedure Verification/Time Out Yes - 10:50 Taken: Location: trunk / arms / legs Wound Size (sq cm): 9.99 Product Size (sq cm): 12 Waste Size (sq cm): 0 Amount of Product Applied (sq cm): 12 Instrument Used:  Forceps, Scissors Lot #: [id number] Order #: 2 Expiration Date: 04/11/2023 Fenestrated: No Reconstituted: Yes Solution Type: saline Solution Amount: 1 ml Lot #: [id number] Solution Expiration Date: 01/31/2022 Secured: Yes Secured With: Steri-Strips Dressing Applied: Yes Primary Dressing: adaptic, ABD Procedural Pain: 0 Post Procedural Pain: 1 Response to Treatment: Procedure was tolerated well Level of Consciousness (Post- Awake and Alert procedure): Post Procedure Diagnosis Same as Pre-procedure Electronic Signature(s) Signed: 12/19/2020 5:20:22 PM By: [name] PA-C Signed: 12/19/2020 6:23:27 PM By: Baruch Gouty RN, BSN Entered By: Baruch Gouty on 12/19/2020 10:54:37 -------------------------------------------------------------------------------- Debridement Details Patient Name: Date of Service: [name], [name] 12/19/2020 10:00 A M Medical Record Number: [id number] Patient Account Number: [id number] Date of Birth/Sex: Treating RN: July 13, 1955 (65 y.o. [name] Provider: [name] Other Clinician: Referring Provider: Treating Provider/Extender: [name] in Treatment: 48 Debridement Performed for Assessment: Wound #15 Left,Medial Malleolus Performed By: Physician [name], PA Debridement Type: Debridement Severity of Tissue Pre Debridement: Fat layer exposed Level of Consciousness (Pre-procedure): Awake and Alert Pre-procedure Verification/Time Out Yes - 10:45 Taken: Start Time: 10:46 Pain Control: Lidocaine 5% topical ointment T Area Debrided (L x W): otal 3.7 (cm) x 2.7 (cm) = 9.99 (cm) Tissue and other material debrided: Viable, Non-Viable, Slough, Subcutaneous, Slough Level: Skin/Subcutaneous Tissue Debridement Description: Excisional Instrument: Curette Bleeding: Minimum Hemostasis Achieved: Pressure End Time: 10:50 Procedural Pain: 3 Post Procedural Pain: 1 Response to Treatment:  Procedure was tolerated well Level of Consciousness (Post- Awake and Alert procedure): Post Debridement Measurements of Total Wound Length: (cm) 3.7 Width: (cm) 2.7 Depth: (cm) 0.1 Volume: (cm) 0.785 Character of Wound/Ulcer Post Debridement: Improved Severity of Tissue Post Debridement: Fat layer exposed Post Procedure Diagnosis Same as Pre-procedure Electronic Signature(s) Signed: 12/19/2020 5:20:22 PM By: [name] PA-C Signed: 12/19/2020 6:23:27 PM By: Baruch Gouty RN, BSN Entered By:  Baruch Gouty on 12/19/2020 10:51:33 -------------------------------------------------------------------------------- HPI Details Patient Name: Date of Service: [facility] [name], [name] 12/19/2020 10:00 A M Medical Record Number: [id number] Patient Account Number: [id number] Date of Birth/Sex: Treating RN: [birth year] (65 y.o. [name] Provider: [name] Other Clinician: Referring Provider: Treating Provider/Extender: [name] in Treatment: 24 History of Present Illness HPI Description: the remaining wound is over the left medial ankle. Similar wound over the right medial ankle healed largely with use of Apligraf. Most recently we have been using Hydrofera Blue over this wound with considerable improvement. The patient has been extensively worked up in the past for her venous insufficiency and she is not a candidate for antireflux surgery although I have none of the details available currently. 08/24/14; considerable improvement today. About 50% of this wound areas now epithelialized. The base of the wound appears to be healthier granulation.as opposed to last week when she had deteriorated a considerable improvement 08/17/14; unfortunately the wound has regressed somewhat. The areas of epithelialization from the superior aspect are not nearly as healthy as they were last week. The patient thinks her Hydrofera Blue slipped. 09/07/14;  unfortunately the area has markedly regressed in the 2 weeks since I've seen this. There is an odor surrounding erythema. The healthy granulation tissue that we had at the base of the wound now is a dusky color. The nurse reports green drainage 09/14/14; the area looks somewhat better than last week. There is less erythema and less drainage. The culture I did did not show any growth. Nevertheless I think it is better to continue the Cipro and doxycycline for a further week. The remaining wound area was debridement. 09/21/14. Wound did not require debridement last week. Still less erythema and less drainage. She can complete her antibiotics. The areas of epithelialization in the superior aspect of the wound do not look as healthy as they did some weeks ago 10/05/14 continued improvement in the condition of this wound. There is advancing epithelialization. Less aggressive debridement required 10/19/14 continued improvement in the condition and volume of this wound. Less aggressive debridement to the inferior part of this to remove surface slough and fibrinous eschar 11/02/14 no debridement is required. The surface granulation appears healthy although some of her islands of epithelialization seem to have regressed. No evidence of infection 11/16/14; lites surface debridement done of surface eschar. The wound does not look to be unhealthy. No evidence of infection. Unfortunately the patient has had podiatry issues in the right foot and for some reason has redeveloped small surface ulcerations in the medial right ankle. Her original presentation involved wounds in this area 11/23/14 no debridement. The area on the right ankle has enlarged. The left ankle wound appears stable in terms of the surface although there is periwound inflammation. There has been regression in the amount of new skin 11/30/14 no debridement. Both wound areas appear healthy. There was no evidence of infection. The the new area on the right  medial ankle has enlarged although that both the surfaces appear to be stable. 12/07/14; Debridement of the right medial ankle wound. No no debridement was done on the left. 12/14/14 no major change in and now bilateral medial ankle wounds. Both of these are very painful but the no overt evidence of infection. She has had previous venous ablation 12/21/14; patient states that her right medial ankle wound is considerably more painful last week than usual. Her left is also somewhat painful. She could not  tolerate debridement. The right medial ankle wound has fibrinous surface eschar 12/28/14 this is a patient with severe bilateral venous insufficiency ulcers. For a considerable period of time we actually had the one on the right medial ankle healed however this recently opened up again in June. The left medial ankle wound has been a refractory area with some absent flows. We had some success with Hydrofera Blue on this area and it literally closed by 50% however it is recently opened up Foley. Both of these were debridement today of surface eschar. She tolerates this poorly 01/25/15: No change in the status of this. Thick adherent escar. Very poor tolerance of any attempt at debridement. I had healed the right medial malleolus wound for a considerable amount of time and had the left one down to about 50% of the volume although this is totally regressed over the last 48 weeks. Further the right leg has reopened. she is trying to make a appointment with pain and vascular, previous ablations with Dr. Aleda Grana. I do not believe there is an arterial insufficiency issue here 02/01/15 the status of the adherent eschar bilaterally is actually improved. No debridement was done. She did not manage to get vascular studies done 02/08/15 continued debridement of the area was done today. The slough is less adherent and comes off with less pressure. There is no surrounding infection peripheral pulses are intact 02/15/15  selective debridement with a disposable curette. Again the slough is less adherent and comes off with less difficulty. No surrounding infection peripheral pulses are intact. 02/22/15 selective debridement of the right medial ankle wound. Slough comes off with less difficulty. No obvious surrounding infection peripheral pulses are intact I did not debridement the one on the left. Both of these are stable to improved 03/01/15 selective debridement of both wound areas using a curette to. Adherent slough cup soft with less difficulty. No obvious surrounding infection. The patient tells me that 2 days ago she noted a rash above the right leg wrap. She did not have this on her lower legs when she change this over she arrives with widespread left greater than right almost folliculitis-looking rash which is extremely pruritic. I don't see anything to culture here. There is no rash on the rest of her body. She feels well systemically. 03/08/15; selective debridement of both wounds using a curette. Base of this does not look unhealthy. She had limegreen drainage coming out of the left leg wound and describes a lot of drainage. The rash on her left leg looks improved to. No cultures were done. 03/22/15; patient was not here last week. Basal wounds does not look healthy and there is no surrounding erythema. No drainage. There is still a rash on the left leg that almost looks vasculitic however it is clearly limited to the top of where the wrap would be. 04/05/15; on the right required a surgical debridement of surface eschar and necrotic subcutaneous tissue. I did not debridement the area on the left. These continue to be large open wounds that are not changing that much. We were successful at one point in healing the area on the right, and at the same time the area on the left was roughly half the size of current measurements. I think a lot of the deterioration has to do with the prolonged time the patient is on her  feet at work 04/19/15 I attempted-like surface debridement bilaterally she does not tolerate this. She tells me that she was in allergic care yesterday with  extreme pain over her left lateral malleolus/ankle and was told that she has an "sprain" 05/03/15; large bilateral venous insufficiency wounds over the medial malleolus/medial aspect of her ankles. She complains of copious amounts of drainage and his usual large amounts of pain. There is some increasing erythema around the wound on the right extending into the medial aspect of her foot to. historically she came in with these wounds the right one healed and the left one came down to roughly half its current size however the right one is reopened and the left is expanded. 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 05/10/15 large bilateral venous insufficiency wounds. There is less adherence surface left however the surface culture that I did last week grew pseudomonas therefore bilateral selective debridement score necessary. There is surrounding erythema. The patient describes severe bilateral drainage and a lot of pain in the left ankle. Apparently her podiatrist was were ready to do a cortisone shot 05/17/15; the patient complains of pain and again copious amounts of drainage. 05/24/15; we used Iodo flex last week. Patient notes considerable improvement in wound drainage. Only needed to change this once. 05/31/15; we continued Iodoflex; the base of these large wounds bilaterally is not too bad but there is probably likely a significant bioburden here. I would like to debridement just doesn't tolerate it. 06/06/14 I would like to continue the Iodoflex although she still hasn't managed to obtain supplies. She has bilateral medial malleoli or large wounds which are mostly superficial. Both of them are covered circumferentially with some nonviable fibrinous slough although she tolerates debridement very  poorly. She apparently has an appointment for an ablation on the right leg by interventional radiology. 06/14/15; the patient arrives with the wounds and static condition. We attempted a debridement although she does not do well with this secondary to pain. I 07/05/15; wounds are not much smaller however there appears to be a cleaner granulating base. The left has tight fibrinous slough greater than the right. Debridement is tolerated poorly due to pain. Iodoflex is done more for these wounds in any of the multitude of different dressings I have tried on the left 1 and then subsequently the right. 07/12/15; no change in the condition of this wound. I am able to do an aggressive debridement on the right but not the left. She simply cannot tolerate it. We have been using Iodoflex which helps somewhat. It is worthwhile remembering that at one point we healed the right medial ankle wound and the left was about 25% of the current circumference. We have suggested returning to vascular surgery for review of possible further ablations for one reason or another she has not been able to do this. 07/26/15 no major change in the condition of either wound on her medial ankle. I did not attempt to debridement of these. She has been aggressively scrubbing these while she is in the shower at home. She has her supply of Iodoflex which seems to have done more for these wounds then anything I have put on recently. 08/09/15 wound area appears larger although not verified by measurements. Using Iodoflex 09/05/2015 -- she was here for avisit today but had significant problems with the wound and I was asked to see her for a physician opinion. I have summarize that this lady has had surgery on her left lower extremity about 10 years ago where the possible veins stripping was done. She has had  an opinion from interventional radiology around November 2016 where no further sclerotherapy was ordered. The patient works 12 hours a day  and stands on a concrete floor with work boots and is unable to get the proper compression she requires and cannot elevate her limbs appropriately at any given time. She has recently grown Pseudomonas from her wound culture but has not started her ciprofloxacin which was called in for her. 09/13/15 this continues to be a difficult situation for this patient. At one point I had this wound down to a 1.5 x 1.5" wound on her left leg. This is deteriorated and the right leg has reopened. She now has substantial wounds on her medial calcaneus, malleoli and into her lower leg. One on the left has surface eschar but these are far too painful for me to debridement here. She has a vascular surgery appointment next week to see if anything can be done to help here. I think she has had previous ablations several years ago at [facility]. She has no major edema. She tells me that she did not get product last time [facility] Ag] and went for several days without it. She continues to work in work boots 12 hours a day. She cannot get compression/4-layer under her work boots. 09/20/15 no major change. Periwound edema control was not very good. Her point with pain and vascular is next Wednesday the 25th 09/28/15; the patient is seen vascular surgery and is apparently scheduled for repeat duplex ultrasounds of her bilateral lower legs next week. 10/05/15; the patient was seen by Dr. Doren Custard of vascular surgery. He feels that she should have arterial insufficiency excluded as cause/contributed to her nonhealing stage she is therefore booked for an arteriogram. She has apparently monophasic signals in the dorsalis pedis pulses. She also of course has known severe chronic venous insufficiency with previous procedures as noted previously. I had another long discussion with the patient today about her continuing to work 12 hour shifts. I've written her out for 2 months area had concerns about this as her work location is currently  undergoing significant turmoil and this may lead to her termination. She is aware of this however I agree with her that she simply cannot continue to stand for 12 hours multiple days a week with the substantial wound areas she has. 10/19/15; the Dr. Doren Custard appointment was largely for an arteriogram which was normal. She does not have an arterial issue. He didn't make a comment about her chronic venous insufficiency for which she has had previous ablations. Presumably it was not felt that anything additional could be done. The patient is now out of work as I prescribed 2 weeks ago. Her wounds look somewhat less aggravated presumably because of this. I felt I would give debridement another try today 10/25/15; no major change in this patient's wounds. We are struggling to get her product that she can afford into her own home through her insurance. 11/01/15; no major change in the patient's wounds. I have been using silver alginate as the most affordable product. I spoke to Dr. Marla Roe last week with her requested take her to the OR for surgical debridement and placement of ACEL. Dr. Marla Roe told me that she would be willing to do this however [facility] will not cover this, fortunately the patient has [place] healthcare of some variant 11/08/15; no major change in the patient's wounds. She has been completely nonviable surface that this but is in too much pain with any attempted  debridement are clinic. I have arranged for her to see Dr. Marla Roe ham of plastic surgery and this appointment is on Monday. I am hopeful that they will take her to the OR for debridement, possible ACEL ultimately possible skin graft 11/22/15 no major change in the patient's wounds over her bilateral medial calcaneus medial malleolus into the lower legs. Surface on these does not look too bad however on the left there is surrounding erythema and tenderness. This may be cellulitis or could him sleepy tinea. 11/29/15; no  major changes in the patient's wounds over her bilateral medial malleolus. There is no infection here and I don't think any additional antibiotics are necessary. There is now plan to move forward. She sees Dr. Marla Roe in a week's time for preparation for operative debridement and ACEL placement I believe on 7/12. She then has a follow-up appointment with Dr. Marla Roe on 7/21 12/28/15; the patient returns today having been taken to the [facility] by Dr. Marla Roe 12/12/15 she underwent debridement, intraoperative cultures [which were negative]. She had placement of a wound VAC. Parent really ACEL was not available to be placed. The wound VAC foam apparently adhered to the wound since then she's been using silver alginate, Xeroform under Ace wraps. She still says there is a lot of drainage and a lot of pain 01/31/16; this is a patient I see monthly. I had referred her to Dr. Marla Roe him of plastic surgery for large wounds on her bilateral medial ankles. She has been to the OR twice once in early July and once in early August. She tells me over the last 3 weeks she has been using the wound VAC with ACEL underneath it. On the right we've simply been using silver alginate. Under Kerlix Coban wraps. 02/28/16; this is a patient I'm currently seeing monthly. She is gone on to have a skin graft over her large venous insufficiency ulcer on the left medial ankle. This was done by Dr. Marla Roe him. The patient is a bit perturbed about why she didn't have one on her right medial ankle wound. She has been using silver alginate to this. 03/06/16; I received a phone call from her plastic surgery Dr. Marla Roe. She expressed some concern about the viability of the skin graft she did on the left medial ankle wound. Asked me to place Endoform on this. She told me she is not planning to do a subsequent skin graft on the right as the left one did not take very well. I had placed Hydrofera Blue on the right 03/13/16;  continue to have a reasonably healthy wound on the right medial ankle. Down to 3 mm in terms of size. There is epithelialization here. The area on the left medial ankle is her skin graft site. I suppose the last week this looks somewhat better. She has an open area inferiorly however in the center there appears to be some viable tissue. There is a lot of surface callus and eschar that will eventually need to come off however none of this looked to be infected. Patient states that the is able to keep the dressing on for several days which is an improvement. 03/20/16 no major change in the circumference of either wound however on the left side the patient was at Dr. Eusebio Friendly office and they did a debridement of left wound. 50% of the wound seems to be epithelialized. I been using Endoform on the left Hydrofera Blue in the right 03/27/16; she arrives today with her wound is not looking  as healthy as they did last week. The area on the right clearly has an adherent surface to this a very similar surface on the left. Unfortunately for this patient this is all too familiar problem. Clearly the Endoform is not working and will need to change that today that has some potential to help this surface. She does not tolerate debridement in this clinic very well. She is changing the dressing wants 04/03/16; patient arrives with the wounds looking somewhat better especially on the right. Dr. Migdalia Dk change the dressing to silver alginate when she saw her on Monday and also sold her some compression socks. The usefulness of the latter is really not clear and woman with severely draining wounds. 04/10/16; the patient is doing a bit of an experiment wearing the compression stockings that Dr. Migdalia Dk provided her to her left leg and the out of legs based dressings that we provided to the right. 05/01/16; the patient is continuing to wear compression stockings Dr. Migdalia Dk provided her on the left that are apparently silver  impregnated. She has been using Iodoflex to the right leg wound. Still a moderate amount of drainage, when she leaves here the wraps only last for 4 days. She has to change the stocking on the left leg every night 05/15/16; she is now using compression stockings bilaterally provided by Dr. Marla Roe. She is wearing a nonadherent layer over the wounds so really I don't think there is anything specific being done to this now. She has some reduction on the left wound. The right is stable. I think all healing here is being done without a specific dressing 06/09/16; patient arrives here today with not much change in the wound certainly in diameter to large circular wounds over the medial aspect of her ankle bilaterally. Under the light of these services are certainly not viable for healing. There is no evidence of surrounding infection. She is wearing compression stockings with some sort of silver impregnation as prescribed by Dr. Marla Roe. She has a follow-up with her tomorrow. 06/30/16; no major change in the size or condition of her wounds. These are still probably covered with a nonviable surface. She is using only her purchase stockings. She did see Dr. Marla Roe who seemed to want to apply Dakin's solution to this I'm not extreme short what value this would be. I would suggest Iodoflex which she still has at home. 07/28/16; I follow Mrs. Shames episodically along with Dr. Marla Roe. 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 She has been applying a topical collagen ointment to both wounds with Adaptic. I don't think Dr. Marla Roe is planning to take her back to the OR. 08/19/16; I follow Mrs. Jeneen Rinks on a monthly basis along with Dr. Marla Roe of plastic surgery. 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 I been following her for a number of years. At one point I was  able to get the right medial malleolus wound to heal and had the left medial malleolus down to about half its current size however and I had to send her to plastic surgery for an operative debridement. Since then things have been stable to slightly improve the area on the right is slightly better one in the left about the same although there is much less adherent surface than I'm used to with this patient. She is using some form of liquid collagen gel that Dr. Marla Roe provided a Kerlix  cover with the patient's own pressure stockings. She tells me that she has extreme pain in both ankles and along the lateral aspect of both feet. She has been unable to work for some period of time. She is telling me she is retiring at the beginning of April. She sees Dr. Doran Durand of orthopedics next week 09/22/16; patient has not seen Dr. Marla Roe since the last time she is here. I'm not really sure what she is using to the wounds other than bits and pieces of think she had left over including most recently Hydrofera Blue. She is using juxtalite stockings. She is having difficulty with her husband's recent illness "stroke". She is having to transport him to various doctors appointments. Dr. Marla Roe left her the option of a repeat debridement with ACEL however she has not been able to get the time to follow-up on this. She continues to have a fair amount of drainage out of these wounds with certainly precludes leaving dressings on all week 10/13/16; patient has not seen Dr. Marla Roe since she was last in our clinic. I'm not really sure what she is doing with the wounds, we did try to get her [facility] and I think she is actually using this most of the time. Because of drainage she states she has to change this every second day although this is an improvement from what she used to do. She went to see Dr. Doran Durand who did not think she had a muscular issue with regards to her feet, he referred her to a neurologist  and I think the appointment is sometime in June. I changed her back to Iodoflex which she has used in the past but not recently. 11/03/16; the patient has been using Iodoflex although she ran out of this. Still claims that there is a lot of drainage although the wound does not look like this. No surrounding erythema. She has not been back to see Dr. Marla Roe 11/24/16; the patient has been using Iodoflex again but she ran out of it 2 or 3 days ago. There is no major change in the condition of either one of these wounds in fact they are larger and covered in a thick adherent surface slough/nonviable tissue especially on the left. She does not tolerate mechanical debridement in our clinic. Going back to see Dr. Marla Roe of plastic surgery for an operative debridement would seem reasonable. 12/15/16; the patient has not been back to see Dr. Marla Roe. She is been dealing with a series of illnesses and her husband which of monopolized her time. She is been using Sorbact which we largely supplied. She states the drainage is bad enough that it maximum she can go 2-3 days without changing the dressing 01/12/2017 -- the patient has not been back for about 4 weeks and has not seen Dr. Marla Roe not does she have any appointment pending. 01/23/17; patient has not seen Dr. Marla Roe even though I suggested this previously. She is using Santyl that was suggested last week by Dr. Con Memos this Cost her $16 through her insurance which is indeed surprising 02/12/17; continuing Santyl and the patient is changing this daily. A lot of drainage. She has not been back to see plastic surgery she is using an Ace wrap. Our intake nurse suggested wrap around stockings which would make a good reasonable alternative 02/26/17; patient is been using Santyl and changing this daily due to drainage. She has not been to see plastic surgery she uses in April Ace wrap to control the edema.  She did obtain extremitease stockings but stated  that the edema in her leg was to big for these 03/20/17; patient is using Santyl and Anasept. Surfaces looked better today the area on the right is actually measuring a little smaller. She has states she has a lot of pain in her feet and ankles and is asking for a consult to pain control which I'll try to help her with through our case manager. 04/10/17; the patient arrives with better-looking wound surfaces and is slightly smaller wound on the left she is using a combination of Santyl and Anasept. She has an appointment or at least as started in the pain control center associated with [facility] 05/14/17; this is a patient who I followed for a prolonged period of time. She has venous insufficiency ulcers on her bilateral medial ankles. At one point I had this down to a much smaller wound on the left however these reopened and we've never been able to get these to heal. She has been using Santyl and Anasept gel although 2 weeks ago she ran out of the Anasept gel. She has a stable appearance of the wound. She is going to the wound care clinic at [facility]. They wanted do a nerve block/spinal block although she tells me she is reluctant to go forward with that. 05/21/17; this is a patient I have followed for many years. She has venous insufficiency ulcers on her bilateral medial ankles. Chronic pain and deformity in her ankles as well. She is been to see plastic surgery as well as orthopedics. Using PolyMem AG most recently/Kerramax/ABDs and 2 layer compression. She has managed to keep this on and she is coming in for a nurse check to change the dressing on Tuesdays, we see her on Fridays 06/05/17; really quite a good looking surface and the area especially on the right medial has contracted in terms of dimensions. Well granulated healthy-looking tissue on both sides. Even with an open curet there is nothing that even feels abnormal here. This is as good as I've seen this in quite some time.  We have been using PolyMem AG and bringing her in for a nurse check 06/12/17; really quite good surface on both of these wounds. The right medial has contracted a bit left is not. We've been using PolyMem and AG and she is coming in for a nurse visit 06/19/17; we have been using PolyMem AG and bringing her in for a nurse check. Dimensions of her wounds are not better but the surfaces looked better bilaterally. She complained of bleeding last night and the left wound and increasing pain bilaterally. She states her wound pain is more neuropathic than just the wounds. There was some suggestion that this was radicular from her pain management doctor in talking to her it is really difficult to sort this out. 06/26/17; using PolyMem and AG and bringing her in for a nurse check as All of this and reasonably stable condition. Certainly not improved. The dimensions on the lateral part of the right leg look better but not really measuring better. The medial aspect on the left is about the same. 07/03/16; we have been using PolyMen AG and bringing her in for a nurse check to change the dressings as the wounds have drainage which precludes once weekly changing. We are using all secondary absorptive dressings.our intake nurse is brought up the idea of using a wound VAC/snap VAC on the wound to help with the drainage to see if this would result  in some contraction. This is not a bad idea. The area on the right medial is actually looking smaller. Both wounds have a reasonable-looking surface. There is no evidence of cellulitis. The edema is well controlled 07/10/17; the patient was denied for a snap VAC by her insurance. The major issue with these wounds continues to be drainage. We are using wicked PolyMem AG and she is coming in for a nurse visit to change this. The wounds are stable to slightly improved. The surface looks vibrant and the area on the right certainly has shrunk in size but very slowly 07/17/17; the  patient still has large wounds on her bilateral medial malleoli. Surface of both of these wounds looks better. The dimensions seem to come and go but no consistent improvement. There is no epithelialization. We do not have options for advanced treatment products due to insurance issues. They did not approve of the wound VAC to help control the drainage. More recently we've been using PolyMem and AG wicked to allow drainage through. We have been bringing her in for a nurse visit to change this. We do not have a lot of options for wound care products and the home again due to insurance issues 07/24/17; the patient's wound actually looks somewhat better today. No drainage measurements are smaller still healthy-looking surface. We used silver collagen under PolyMen started last week. We have been bringing her in for a dressing change 07/31/17; patient's wound surface continued to look better and I think there is visible change in the dimensions of the wound on the right. Rims of epithelialization. We have been using silver collagen under PolyMen and bringing her in for a dressing change. There appears to be less drainage although she is still in need of the dressing change 08/07/17. Patient's wound surface continues to look better on both sides and the area on the right is definitely smaller. We have been using silver collagen and PolyMen. She feels that the drainage has been it has been better. I asked her about her vascular status. She went to see Dr. Aleda Grana at [facility] and had some form of ablation. I don't have much detail on this. I haven't my notes from 2016 that she was not a candidate for any further ablation but I don't have any more information on this. We had referred her to vein and vascular I don't think she ever went. He does not have a history of PAD although I don't have any information on this either. We don't even have ABIs in our record 08/14/17; we've been using silver collagen and  PolyMen cover. And putting the patient and compression. She we are bringing her in as a nurse visit to change this because ofarge amount of drainage. We didn't the ABIs in clinic today since they had been done in many moons 1.2 bilaterally. She has been to see vein and vascular however this was at [facility] and she had ablation although I really don't have any information on this all seemed biking get a report. She is also been operatively debrided by plastic surgery and had a cell placed probably 8-12 months ago. This didn't have a major effect. We've been making some gains with current dressings 08/19/17-She is here in follow-up evaluation for bilateral medial malleoli ulcers. She continues to tolerate debridement very poorly. We will continue with recently changed topical treatment; if no significant improvement may consider switching to Iodosorb/Iodoflex. 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  She has been using silver collagen and PolyMem. I believe she has been used and tried on Iodoflex before. 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 These have been very refractory over time. We have been using silver collagen and PolyMen. She is coming in once a week for a doctor's and once a week for nurse visits. We are actually making some progress 09/18/17; the patient's wounds are smaller especially on the right medial. She arrives today to upset to consider even washing these off with Anasept which I think is been part of the reason this is been closing. We've been using collagen covered in PolyMen otherwise. It is noted that she has a small area of folliculitis on the right medial calf that. As we are wrapping her legs I'll give her a short course of  doxycycline to make sure this doesn't amount to anything. She is a long list of complaints today including imbalance, shortness of breath on exertion, inversion of her left ankle. With regards to the latter complaints she is been to see orthopedics and they offered her a tendon release surgery I believe but wanted her wounds to be closed first. I have recommended she go see her primary physician with regards to everything else. 09/25/17; patient's wounds are about the same size. We have made some progress bilaterally although not in recent weeks. She will not allow me T wash these wounds with Anasept even if she is doing her cell. Wheeze we've been using collagen covered in PolyMen. Last week she had a small area of folliculitis this is now opened into a small wound. She completed 5 days of trimethoprim sulfamethoxazole 10/02/17; unfortunately the area on her left medial ankle is worse with a larger wound area towards the Achilles. The patient complains of a lot of pain. She will not allow debridement although visually I don't think there is anything to debridement in any case. We have been using silver collagen and PolyMen for several months now. Initially we are making some progress although I'm not really seeing that today. We will move back to [facility]. His admittedly this is a bit of a repeat however I'm hoping that his situation is different now. The patient tells me she had her leg on the left give out on her yesterday this is process some pain. 10/09/17; the patient is seen twice a week largely because of drainage issues coming out of the chronic medial bimalleolar wounds that are chronic. Last week the dimensions of the one on the left looks a little larger I changed her to [facility]. She comes in today with a history of terrible pain in the bilateral wound areas. She will not allow debridement. She will not even allow a tissue culture. There is no surrounding erythema no no evidence of  cellulitis. We have been putting her Kerlix Coban man. She will not allow more aggressive compression as there was a suggestion to put her in 3 layer wraps. 10/16/17; large wounds on her bilateral medial malleoli. These are chronic. Not much change from last week. The surface looks have healthy but absolutely no epithelialization. A lot of pain little less so of drainage. She will not allow debridement or even washing these off in the vigorous fashion with Anasept. 10/23/17; large wounds on her bilateral malleoli which are  chronic. Some improvement in terms of size perhaps on the right since last time I saw these. She states that after we increased the 3 layer compression there was some bleeding, when she came in for a nurse visit she did not want 3 layer compression put back on about our nurse managed to convince her. She has known chronic venous visit issues and I'm hoping to get her to tolerate the 3 layer compression. using Hydrofera Blue 10/30/17; absolutely no change in the condition of either wound although we've had some improvement in dimensions on the right.. Attempted to put her in 3 layer compression she didn't tolerated she is back in 2 layer compression. We've been using Hydrofera Blue We looked over her past records. She had venous reflux studies in November 2016. There was no evidence of deep venous reflux on the right. Superficial vein did not show the greater saphenous vein at think this is been previously ablated the small saphenous vein was within normal limits. The left deep venous system showed no DVT the vessels were positive for deep venous reflux in the posterior tibial veins at the ankle. The greater saphenous vein was surgically absent small saphenous vein was within normal limits. She went to vein and vascular at [facility]. I believe she had an ablation on the left greater saphenous vein. I'll update her reflux studies perhaps ever reviewed by vein and vascular. We've made  absolutely no progress in these wounds. Will also try to read and TheraSkins through her insurance 11/06/17; W the patient apparently has a 2 week follow-up with vein and vascular I like him to review the whole issue with regards to her previous vascular workup by Dr. Aleda Grana. We've really made no progress on these wounds in many months. She arrives today with less viable looking surface on the left medial ankle wound. This was apparently looking about the same on Tuesday when she was here for nurse visit. 11/13/17; deep tissue culture I did last time of the left lower leg showed multiple organisms without any predominating. In particular no Staphylococcus or group A strep were isolated. We sent her for venous reflux studies. She's had a previous left greater saphenous vein stripping and I think sclerotherapy of the right greater saphenous vein. She didn't really look at the lesser saphenous vein this both wounds are on the medial aspect. She has reflux in the common femoral vein and popliteal vein and an accessory vein on the right and the common femoral vein and popliteal vein on the left. I'm going to have her go to see vein and vascular just the look over things and see if anything else beside aggressive compression is indicated here. We have not been able to make any progress on these wounds in spite of the fact that the surface of the wounds is never look too bad. 11/20/17; no major change in the condition of the wounds. Patient reports a large amount of drainage. She has a lot of complaints of pain although enlisting her today I wonder if some of this at least his neuropathic rather than secondary to her wounds. She has an appointment with vein and vascular on 12/30/17. The refractory nature of these wounds in my mind at least need vein and vascular to look over the wounds the recent reflux studies we did and her history to see if anything further can be done here. I also note her gait is  deteriorated quite a bit. Looks like she has inversion of her  foot on the right. She has a bilateral Trendelenburg gait. I wonder if this is neuropathic or perhaps multilevel radicular. 11/27/17; her wounds actually looks slightly better. Healthy-looking granulation tissue a scant amount of epithelialization. [place] healthcare will not pay for [employer]. They will play for tri layer Oasis and Dermagraft. This is not a diabetic ulcer. We'll try for the tri layer Oasis. She still complains of some drainage. She has a vein and vascular appointment on 12/30/17 12/04/17; the wounds visually look quite good. Healthy-looking granulation with some degree of epithelialization. We are still waiting for response to our request for trial to try layer Oasis. Her appointment with vascular to review venous and arterial issues isn't sold the end of July 7/31. Not allow debridement or even vigorous cleansing of the wound surface. 12/18/17; slightly smaller especially on the right. 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 We've been using Hydrofera Blue. We still are looking into triple layer Oasis through her insurance 01/08/18 on evaluation today patient's wound actually appears to be showing signs of good improvement at this point in time. She has been tolerating the dressing changes without complication. Fortunately there does not appear to be any evidence of infection at this point in time. We have been utilizing silver nitrate which does seem to be of benefit for her which is also good news. Overall I'm very happy with how things seem to be both regards appearance as well as measurement. Patient did see Dr. Bridgett Larsson for evaluation on 12/30/17. In his assessment he felt that stripping would not likely add much more than chronic compression to the patient's healing process. His recommendation was to follow-up in three months with Dr. Doren Custard if she hasn't healed in order to consider referral back to  you and see vascular where she previously was in a trial and was able to get her wound to heal. I'll be see what she feels she when you staying compression and he reiterated this as well. 01/13/18 on evaluation today patient appears to actually be doing very well in regard to her bilateral medial malleolus ulcers. She seems to have tolerated the chemical cauterization with silver nitrate last week she did have some pain through that evening but fortunately states that I'll be see since it seems to be doing better she is overall pleased with the progress. 01/21/18; really quite a remarkable improvement since I've last seen these wounds. We started using silver nitrate specially on the islands of hyper granulation which for some reason her around the wound circumference. This is really done quite nicely. Primary dressing Hydrofera Blue under 4 layer compression. She seems to be able to hold out without a nurse rewrap. Follow-up in 1 week 01/28/18; we've continued the hydrofera blue but continued with chemical cauterization to the wound area that we started about a month ago for irregular hyper granulation. She is made almost stunning improvement in the overall wound dimensions. I was not really expecting this degree of improvement in these chronic wounds 02/05/18; we continue with Hydrofera Bluebut of also continued the aggressive chemical cauterization with silver nitrate. We made nice progress with the right greater than left wound. 02/12/18. We continued with Hydrofera Blue after aggressive chemical cauterization with silver nitrate. We appear to be making nice progress with both wound areas 02/19/2018; we continue with [facility] after washing the wounds vigorously with Anasept spray and chemical cauterization with silver nitrate. We are making excellent progress. The area on the right's just about closed 02/26/2018. The  area on the left medial ankle had too much necrotic debris today. I used a #5  curette we are able to get most of the soft. I continued with the silver nitrate to the much smaller wound on the right medial ankle she had a new area on her right lower pretibial area which she says was due to a role in her compression 03/05/2018; both wound areas look healthy. Not much change in dimensions from last week. I continue to use silver nitrate and Hydrofera Blue. The patient saw Dr. Doren Custard of vein and vascular. He felt she had venous stasis ulcers. He felt based on her previous arteriogram she should have adequate circulation for healing. Also she has deep venous reflux but really no significant correctable superficial venous reflux at this time. He felt we should continue with conservative management including leg elevation and compression 04/02/2018; since we last saw this woman about a month ago she had a fall apparently suffered a pelvic fracture. I did not look up the x-ray. Nevertheless because of pain she literally was bedbound for 2 weeks and had home health coming out to change the dressing. Somewhat predictably this is resulted in considerable improvement in both wound areas. The right is just about closed on the medial malleolus and the left is about half the size. 04/16/2018; both her wounds continue to go down in size. Using Hydrofera Blue. 05/07/18; both her wounds appeared to be improving especially on the right where it is almost closed. We are using Hydrofera Blue 05/14/2018; slightly worse this week with larger wounds. Surface on the left medial not quite as good. We have been using Hydrofera Blue 05/21/18; again the wounds are slightly larger. Left medial malleolus slightly larger with eschar around the circumference. We have been using Hydrofera Blue undergoing a wraps for a prolonged period of time. This got a lot better when she was more recumbent due to a fall and a back injury. I change the primary dressing the silver alginate today. She did not tolerate a 4 layer  compression previously although I may need to bring this up with her next time 05/28/2018; area on the left medial malleolus again is slightly larger with more drainage. Area on the right is roughly unchanged. She has a small area of folliculitis on the right medial just on the lower calf. This does not look ominous. 06/03/2018 left medial malleolus slightly smaller in a better looking surface. We used silver nitrate on this last time with silver alginate. The area on the right appears slightly smaller 1/10; left medial malleolus slightly smaller. Small open area on the right. We used silver nitrate and silver alginate as of 2 weeks ago. We continue with the wound and compression. These got a lot better when she was off her feet 1/17; right medial malleolus wound is smaller. The left may be slightly smaller. Both surfaces look somewhat better. 1/24; both wounds are slightly smaller. Using silver alginate under Unna boots 1/31; both wounds appear smaller in fact the area on the right medial is just about closed. Surface eschar. We have been using silver alginate under Unna boots. The patient is less active now spends let much less time on her feet and I think this is contributed to the general improvement in the wound condition 2/7; both wounds appear smaller. I was hopeful the right medial would be closed however there there is still the same small open area. Slight amount of surface eschar on the left the dimensions are  smaller there is eschar but the wound edges appear to be free. We have been using silver alginate under Unna boot's 2/14; both wounds once again measure smaller. Circumferential eschar on the left medial. We have been using silver alginate under Unna boots with gradual improvement 2/21; the area on the right medial malleolus has healed. The area on the left is smaller. We have been using silver alginate and Unna boots. We can discharge wrapping the right leg she has 20/30 stockings at  home she will need to protect the scar tissue in this area 2/28; the area on the right medial malleolus remains closed the patient has a compression stocking. The area on the left is smaller. We have been using silver alginate and Unna boots. 3/6 the area on the right medial ankle remains closed. Good edema control noted she is using her own compression stocking. The area on the left medial ankle is smaller. We have been managing this with silver alginate and Unna boots which we will continue today. 3/13; the area on the right medial ankle remains closed and I'm declaring it healed today. When necessary the left is about the same still a healthy-looking surface but no major change and wound area. No evidence of infection and using silver alginate under unna and generally making considerable improvement 3/27 the area on the right medial ankle remains closed the area on the left is about the same as last week. Certainly not any worse we have been using silver alginate under an Unna boot 4/3; the area on the right medial ankle remains closed per the patient. We did not look at this wound. The wound on the left medial ankle is about the same surface looks healthy we have been using silver alginate under an Unna boot 4/10; area on the right medial ankle remains closed per the patient. We did not look at this wound. The wound on the left medial ankle is slightly larger. The patient complains that the [facility] caused burning pain all week. She also told us that she was a lot more active this week. Changed her back to silver alginate 4/17; right medial ankle still closed per the patient. Left medial ankle is slightly larger. Using silver alginate. She did not tolerate Hydrofera Blue on this area 4/24; right medial ankle remains closed we have not look at this. The left medial ankle continues to get larger today by about a centimeter. We have been using silver alginate under Unna boots. She complains  about 4 layer compression as an alternative. She has been up on her feet working on her garden 5/8; right medial ankle remains closed we did not look at this. The left medial ankle has increased in size about 100%. We have been using silver alginate under Unna boots. She noted increased pain this week and was not surprised that the wound is deteriorated 5/15; no major change in SA however much less erythema ( one week of doxy ocellulitis). 5/22-65 year old female returns at 1 week to the clinic for left medial ankle wound for which we have been using silver alginate under 3 layer compression She was placed on DOXY at last visit - the wound is wider at this visit. She is in 3 layer compression 5/29; change to [facility] last week. I had given her empiric doxycycline 2 weeks ago for a week. She is in 3 layer compression. She complains of a lot of pain and drainage on presentation today. 6/5; using Hydrofera Blue. I gave  her doxycycline recently empirically for erythema and pain around the wound. Believe her cultures showed enterococcus which not would not have been well covered by doxycycline nevertheless the wound looks better and I don't feel specifically that the enterococcus needs to be covered. She has a new what looks like a wrap injury on her lateral left ankle. 6/12; she is using Hydrofera Blue. She has a new area on the left anterior lower tibial area. This was a wrap injury last week. 6/19; the patient is using Hydrofera Blue. She arrived with marked inflammation and erythema around the wound and tenderness. 12/01/18 on evaluation today patient appears to be doing a little bit better based on what I'm hearing from the standpoint of lassos evaluation to this as far as the overall appearance of the wound is concerned. Then sometime substandard she typically sees Dr. Dellia Nims. Nonetheless overall very pleased with the progress that she's made up to this point. No fevers, chills, nausea, or  vomiting noted at this time. 7/10; some improvement in the surface area. Aggressively debrided last week apparently. I went ahead with the debridement today although the patient does not tolerate this very well. We have been using Iodoflex. Still a fair amount of drainage 7/17; slightly smaller. Using Iodoflex. 7/24; no change from last week in terms of surface area. We have been using Iodoflex. Surface looks and continues to look somewhat better 7/31; surface area slightly smaller better looking surface. We have been using Iodoflex. This is under Unna boot compression 8/7-Patient presents at 1 week with Unna boot and Iodoflex, wound appears better 8/14-Patient presents at 1 week with Iodoflex, we use the Unna boot, wound appears to be stable better.Patient is getting Botox treatment for the inversion of the foot for tendon release, Next week 8/21; we are using Iodoflex. Unna boot. The wound is stable in terms of surface area. Under illumination there is some areas of the wound that appear to be either epithelialized or perhaps this is adherent slough at this point I was not really clear. It did not wipe off and I was reluctant to debride this today. 8/28; we are using Iodoflex in an Unna boot. Seems to be making good improvement. 9/4; using Iodoflex and wound is slightly smaller. 9/18; we are using Iodoflex with topical silver nitrate when she is here. The wound continues to be smaller 10/2; patient missed her appointment last week due to GI issues. She left and Iodoflex based dressing on for 2 weeks. Wound is about the same size about the size of a dime on the left medial lower 10/9 we have been using Iodoflex on the medial left ankle wound. She has a new superficial probable wrap injury on the dorsal left ankle 10/16; we have been using Hydrofera Blue since last week. This is on the left medial ankle 10/23; we have been using Hydrofera Blue since 2 weeks ago. This is on the left medial ankle.  Dimensions are better 11/6; using Hydrofera Blue. I think the wound is smaller but still not closed. Left medial ankle 11/13; we have been using Hydrofera Blue. Wound is certainly no smaller this week. Also the surface not as good. This is the remanent of a very large area on her left medial ankle. 11/20; using Sorbact since last week. Wound was about the same in terms of size although I was disappointed about the surface debris 12/11; 3-week follow-up. Patient was on vacation. Wound is measuring slightly larger we have been using Sorbact. 12/18; wound  is about the same size however surface looks better last week after debridement. We have been using Sorbact under compression 1/15 wound is probably twice the size of last time increased in length nonviable surface. We have been using Sorbact. She was running a mild fever and missed her appointment last week 1/22; the wound is come down in size but under illumination still a very adherent debris we have been Hydrofera Blue that I changed her to last week 1/29; dimensions down slightly. We have been using Hydrofera Blue 2/19 dimensions are the same however there is rims of epithelialization under illumination. Therefore more the surface area may be epithelialized 2/26; the patient's wound actually measures smaller. The wound looks healthy. We have been using Hydrofera Blue. I had some thoughts about running Apligraf then I still may do that however this looks so much better this week we will delay that for now 3/5; the wound is small but about the same as last week. We have been using Hydrofera Blue. No debridement is required today. 3/19; the wound is about the size of a dime. Healthy looking wound even under illumination. We have been using Hydrofera Blue. No mechanical debridement is necessary 3/26; not much change from last week although still looks very healthy. We have been using Hydrofera Blue under Unna boots Patient was offered an ankle  fusion by podiatry but not until the wound heals with a proceed with this. 4/9; the patient comes in today with her original wound on the medial ankle looking satisfactory however she has some uncontrolled swelling in the middle part of her leg with 2 new open areas superiorly just lateral to the tibia. I think this was probably a wrap issue. She said she felt uncomfortable during the week but did not call in. We have been using Hydrofera Blue 4/16; the wound on the medial ankle is about the same. She has innumerable small areas superior to this across her mid tibia. I think this is probably folliculitis. She is also been working in the yard doing a lot of sweating 4/30; the patient issue on the upper areas across her mid tibia of all healed. I think this was excessive yard work if I remember. Her wound on the medial ankle is smaller. Some debris on this we have been using Hydrofera Blue under Unna boots 5/7; mid tibia. She has been using Hydrofera Blue under an Unna wrap. She is apparently going for her ankle surgery on June 3 10/28/19-Patient returns to clinic with the ankle wound, we are using Hydrofera Blue under Unna wrap, surgery is scheduled for her left foot for June 3 so she will be back for nurse visit next week READMISSION 01/17/2020 Mrs. Cuddihy is a 65 year old woman we have had in this clinic for a long period of time with severe venous hypertension and refractory wounds on her medial lower legs and ankles bilaterally. 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 When she was off her legs for a prolonged period example when she fell and suffered a compression fracture things would heal up quite nicely. She is now retired and we managed to heal up the right medial leg wound. The left one was very tiny last time I saw this although still refractory. 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 It got to the point where this was interfering with her walking and she elected to proceed with a ankle arthrodesis to straighten her her ankle and leave her with a functional outcome for mobilization. The patient was referred to Dr. Doren Custard and really this took some time to arrange. Dr. Doren Custard saw her on 12/07/2019. Once again he verified that she had no arterial issues. She had previously had an angiogram several years ago. Follow-up ABIs on the left showed an ABI of 1.12 with triphasic waveforms and a TBI of 0.92. She is felt to have chronic deep venous insufficiency but I do not think it was felt that anything could be done from about this from an ablation point of view. At the time Dr. Doren Custard saw this patient the wounds actually look closed via the pictures in his clinic. The patient finally underwent her surgery on 12/15/2019. This went reasonably well and there was a good anatomic outcome. She developed a small distal wound dehiscence on the lateral part of the surgical wound. However more problematically she is developed recurrence of the wound on the medial left ankle. There are actually 2 wounds here one in the distal lower leg and 1 pretty much at the level of the medial malleolus. It is a more distal area that is more problematic. She has been using Hydrofera Blue which started on Friday before this she was simply Ace wrapping. There was a culture done that showed Pseudomonas and she is on ciprofloxacin. A recent CNS on 8/11 was negative. The patient reports some pain but I generally think this is improving. She is using a cam boot completely nonweightbearing using a walker for pivot transfers and a wheelchair 8/24; not much improvement unfortunately she has a surgical wound on the lateral part in the venous insufficiency wound medially. The bottom part of the medial insufficiency wound  is still necrotic there is exposed tendon here. We have been using Hydrofera Blue under compression. Her edema control is however better 8/31; patient in for follow-up of his surgical wound on the lateral part of her left leg and chronic venous insufficiency ulcers medially. We put her back in compression last week. She comes in today with a complaint of 3 or 4 days worth of increasing pain. She felt her cam walker was rubbing on the area on the back of her heel. However there is intense erythema seems more likely she has cellulitis. She had 2 cultures done when she was seeing podiatry in the postop. One of them in late July showed Pseudomonas and she received a course of ciprofloxacin the other was negative on 8/11 she is allergic to penicillin with anaphylactoid complaints of hives oral swelling via information in epic 9/9; when I saw this patient last week she had intense anterior erythema around her wound on the right lateral heel and ankle and also into the right medial heel. Some of this was no doubt drainage and her walker boot however I was convinced she had cellulitis. I gave her Levaquin and Bactrim she is finishing up on this now. She is following up with Dr. [name] he saw her yesterday. He is taken her out of the walking boot of course she is still nonweightbearing. Her x-ray was negative for any worrisome features such as soft tissue air etc. Things are a lot better this week. She has home health. We have been using Hydrofera Blue under an [employer] which she put back on yesterday. I did not  wrap her last week 9/17; her surrounding skin looks a lot better. In fact the area on the left lateral ankle has just a scant amount of eschar. The only remaining wound is the large area on the left medial ankle. Probably about 60% of this is healthy granulation at the surface however she has a significant divot distally. This has adherent debris in it. I been using debridement and silver collagen to  try and get this area to fill-in although I do not think we have made much progress this week 9/24; the patient's wound on the left medial ankle looks a lot better. The deeper divot area distally still requires debridement but this is cleaning up quite nicely we have been using silver collagen. The patient is complaining of swelling in her foot and is worried that that is contributing to the nonhealing of the ankle wound. She is also complaining of numbness in her anterior toes 10/4; left medial ankle. The small area distally still has a divot with necrotic material that I have been debriding away. This has an undermining area. She is approved for Apligraf. She saw Dr. [name] her surgeon on 10/1. I think he declared himself is satisfied with the condition of things. Still nonweightbearing till the end of the month. 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 Her surgical wound is well closed. There is no evidence of infection 10/11; the patient arrived in clinic today with the expectation that we be able to put an Apligraf on this area after debridement however she arrives with a relatively large amount of green drainage on the dressing. The patient states that this started on Friday. She has not been systemically unwell. 10/19; culture I did last week showed both Enterococcus and Pseudomonas. I think this came in separate parts because I stopped her ciprofloxacin I gave her and prescribed her linezolid however now looking at the final culture result this is Pseudomonas which is resistant to quinolones. She has not yet picked up the linezolid apparently phone issues. We are also trying to get a topical antibiotic out of [place] in [place] they can be applied by home health. She is still having green drainage 10/16; the patient has her topical antibiotic from [facility] in [place]. This is a compounded gel with vancomycin and ciprofloxacin and  gentamicin. We are applying this on the wound bed with silver alginate over the top with Unna boot wraps. She arrives in clinic today with a lot less ominous looking drainage although she is only use this topical preparation once the second time today. She sees Dr. [name] her surgeon on Friday she has home health changing the dressing 11/2; still using her compounded topical antibiotic under silver alginate. Surface is cleaning up there is less drainage. We had an Apligraf for her today and I elected to apply it. A light coating of her antibiotic 04/25/2020 upon evaluation today patient appears to be doing well with regard to her ankle ulcer. There is a little bit of slough noted on the surface of the wound I am can have to perform sharp debridement to clear this away today. With that being said other than that fact overall I feel like she is making progress and we do see some new epithelial growth. There is also some improvement in the depth of the wound and that distal portion. There is little bit of slough there as well. 12/7; 2-week follow-up. Apligraf #3. Dimensions are smaller. Closing in especially  inferiorly. Still some surface debris. Still using the [facility] topical antibiotic but I told her that I don't think this needs to be renewed 12/21; 2-week follow-up. Apligraf #4 dimensions are smaller. Nice improvement 06/05/2020; 2-week follow-up. The patient's wound on the left medial ankle looks really excellent. Nice granulation. Advancing epithelialization no undermining no evidence of infection. We would have to reapply for another Apligraf but with the condition of this wound I did not feel strongly about it. We used Hydrofera Blue under the same degree of compression. She follows up with Dr. [name] her surgeon a week Friday 06/13/2020 upon evaluation today patient appears to be doing excellent in regard to her wound. She has been tolerating the dressing changes without complication. Fortunately  there is no signs of active infection at this time. No fevers, chills, nausea, vomiting, or diarrhea. She was using Hydrofera Blue last week. 06/20/2020 06/20/2020 on evaluation today patient actually appears to be doing excellent in regard to her wound. This is measuring better and looking much better as well. She has been using the collagen that seems to be doing better for her as well even though the [facility] was and is not sticking or feeling as rough on her wound. She did see Dr. [name] on Friday he is very pleased he also stated none of the hardware has shifted. That is great news 1/27; the patient has a small clean wound all that is remaining. I agree that this is too small to really consider an Apligraf. Under illumination the surface is looking quite good. We have been using collagen although the dimensions are not any better this week 2/2; the patient has a small clean wound on the left medial ankle. Although this left of her substantial original areas. Measurements are smaller. We have been using polymen Ag under an Haematologist. 2/10; small area on the left medial ankle. This looks clean nothing to debride however dimensions are about the same we have been using polymen I think now for 2 weeks 2/17; not much change in surface area. We have been using polymen Ag without any improvement. 3/17; 1 month follow-up. The patient has been using endoform without any improvement in fact I think this looks worse with more depth and more expansion 3/24; no improvement. Perhaps less debris on the surface. We have been using Sorbact for 1 week 4/4; wound measures larger. She has edema in her leg and her foot which she tells as her wrap came down. We have been using Unna boots. Sorbact of the wound. She has been approved for Apligraf 09/12/2020 upon evaluation today patient appears to be doing well with regard to her wound. We did get the Apligraf reapproved which is great news we have that available  for application today. Fortunately there is no signs of infection and overall the patient seems to be doing great. The wound bed is nice and clean. 4/27; patient presents for her second application of Apligraf. She states over the past week she has been on her feet more often due to being outside in her garden. She has noted more swelling to her foot as a result. She denies increased warmth, pain or erythema to the wound site. 10/10/2020 upon evaluation today patient appears to be doing well with regard to her wound which does not appear to be quite as irritated as last week from what I am hearing. With that being said unfortunately she is having issues with some erythema and warmth to touch as well  as an increase size. I do believe this likely is infected. 10/17/2020 upon evaluation today patient appears to be doing excellent in regard to her wound this is significantly improved as compared to last week. Fortunately I think that the infection is much better controlled at this point. She did have evidence of both Enterococcus as well as Staphylococcus noted on culture. Enterococcus really would not be helped significantly by the Cipro but the wound is doing so much better I am under the assumption that the Staphylococcus is probably the main organism that is causing the current infection. Nonetheless I think that she is doing excellent as far as that is concerned and I am very pleased in that regard. I would therefore recommend she continue with the Cipro. 10/31/2020 upon evaluation today patient appears to be doing well with regard to her wound. She has been tolerating the dressing changes without complication. Fortunately there is no signs of active infection and overall I am extremely pleased with where things stand today. No fevers, chills, nausea, vomiting, or diarrhea. With that being said she does have some green drainage coming from the wound and although it looks okay I am a little concerned about  the possibility of a continuing infection. Specifically with Pseudomonas. For that reason I will go ahead and send in a prescription for Cipro for her to be continued. 11/14/2020 upon evaluation today patient appears to be doing very well currently in regard to her wound on her leg. She has been tolerating the dressing changes without complication. Fortunately I feel like the infection is finally under good control here. Unfortunately we do not have the Apligraf for application today although we can definitely order to have it in place for next week. That will be her fifth and final of the current series. Nonetheless I feel like her wound is really doing quite well which is great news. 11/21/2020 upon evaluation today patient appears to be doing well with regard to her wound on the medial ankle. Fortunately I think the infection is under control and I do believe we can go ahead and reapply the Apligraf today. She is in agreement with that plan. There does not appear to be any signs of active infection at this time which is great news. No fevers, chills, nausea, vomiting, or diarrhea. 12/05/2020 upon evaluation today patient's wound bed actually showed signs of good granulation epithelization at this point. There does not appear to be any signs of infection which is great news and overall very pleased with where things stand. Overall the patient seems to be doing fairly well in my opinion with regard to her wound although I do believe she continues to build up a lot of biofilm I think she could benefit from using PuraPly at this point. 12/12/2020 upon evaluation today patient's wound actually appears to be doing decently well today. The Unna boot has not been quite as well-tolerated so that more uncomfortable for her and even causing some pressure over the plate on the lateral portion of her foot which is 90 where the wound is. There did not appear to be any significant deep tissue injury with that there may  be a minimal change in the skin noted I think that we may want to go back to the Coflex 2 layer which is a little bit easier on her skin it seems. 12/19/2020 upon evaluation today patient actually seems to be making great progress with the PuraPly currently. She in fact seems to be much better  as far as the overall appearance of the wound bed is concerned I am very happy in this regard. I do not see any signs of of infection which is great news as well. No fevers, chills, nausea, vomiting, or diarrhea. Electronic Signature(s) Signed: 12/19/2020 10:59:33 AM By: [name] PA-C Entered By: [name] on 12/19/2020 10:59:33 -------------------------------------------------------------------------------- Physical Exam Details Patient Name: Date of Service: [facility] [name], [name] 12/19/2020 10:00 A M Medical Record Number: [id number] Patient Account Number: [id number] Date of Birth/Sex: Treating RN: 1956-01-15 (65 y.o. [name] Provider: [name] Other Clinician: Referring Provider: Treating Provider/Extender: [name] in Treatment: 96 Constitutional Well-nourished and well-hydrated in no acute distress. Respiratory normal breathing without difficulty. Psychiatric this patient is able to make decisions and demonstrates good insight into disease process. Alert and Oriented x 3. pleasant and cooperative. Notes Upon inspection patient's wound bed actually showed signs of good granulation epithelization at this point. She did have some slough on the surface of the wound as well as biofilm I cleared this off without complication today and postdebridement wound bed appears to be doing significantly better which is great news. I do not see any evidence of active infection at this time which is great. It is also measuring a little bit smaller today which is also a plus. Electronic Signature(s) Signed: 12/19/2020 11:00:06 AM By: [name] PA-C Entered By: [name] on 12/19/2020 11:00:06 -------------------------------------------------------------------------------- Physician Orders Details Patient Name: Date of Service: [facility] [name], [name] 12/19/2020 10:00 A M Medical Record Number: [id number] Patient Account Number: [id number] Date of Birth/Sex: Treating RN: Aug 08, 1955 (65 y.o. [name] Provider: [name] Other Clinician: Referring Provider: Treating Provider/Extender: [name] in Treatment: 81 Verbal / Phone Orders: No Diagnosis Coding ICD-10 Coding Code Description [phone] Chronic venous hypertension (idiopathic) with ulcer and inflammation of left lower extremity L97.828 Non-pressure chronic ulcer of other part of left lower leg with other specified severity L97.328 Non-pressure chronic ulcer of left ankle with other specified severity Follow-up Appointments ppointment in 1 week. - with Margarita Grizzle - Puraply Return A Cellular or Tissue Based Products Cellular or Tissue Based Product Type: - PuraPly: #2 Bathing/ Shower/ Hygiene May shower with protection but do not get wound dressing(s) wet. Edema Control - Lymphedema / SCD / Other Elevate legs to the level of the heart or above for 30 minutes daily and/or when sitting, a frequency of: - throughout the day Avoid standing for long periods of time. Exercise regularly Compression stocking or Garment 20-30 mm/Hg pressure to: - right leg daily Additional Orders / Instructions Follow Nutritious Diet Wound Treatment Wound #15 - Malleolus Wound Laterality: Left, Medial Cleanser: Soap and Water 1 x Per Week/30 Days Discharge Instructions: May shower and wash wound with dial antibacterial soap and water prior to dressing change. Peri-Wound Care: Triamcinolone 15 (g) 1 x Per Week/30 Days Discharge Instructions: Use triamcinolone 15 (g) mixed with lotion Peri-Wound Care: Sween Lotion (Moisturizing lotion) 1  x Per Week/30 Days Discharge Instructions: Apply moisturizing lotion as directed Secondary Dressing: Woven Gauze Sponge, Non-Sterile 4x4 in 1 x Per Week/30 Days Discharge Instructions: Apply over primary dressing as directed. Secondary Dressing: ABD Pad, 5x9 1 x Per Week/30 Days Discharge Instructions: Apply over primary dressing and lateral ankle Secondary Dressing: ADAPTIC TOUCH 3x4.25 in 1 x Per Week/30 Days Discharge Instructions: Apply over skin sub Compression Wrap: CoFlex TLC XL  2-layer Compression System 4x7 (in/yd) 1 x Per Week/30 Days Discharge Instructions: Apply CoFlex 2-layer compression as directed. (alt for 4 layer) Electronic Signature(s) Signed: 12/19/2020 5:20:22 PM By: [name] PA-C Signed: 12/19/2020 6:23:27 PM By: Baruch Gouty RN, BSN Entered By: Baruch Gouty on 12/19/2020 10:55:46 -------------------------------------------------------------------------------- Problem List Details Patient Name: Date of Service: [name], [name] 12/19/2020 10:00 A M Medical Record Number: [id number] Patient Account Number: [id number] Date of Birth/Sex: Treating RN: [birth year] (65 y.o. [name] Provider: [name] Other Clinician: Referring Provider: Treating Provider/Extender: [name] in Treatment: 33 Active Problems ICD-10 Encounter Code Description Active Date MDM Diagnosis I87.332 Chronic venous hypertension (idiopathic) with ulcer and inflammation of left 01/17/2020 No Yes lower extremity L97.828 Non-pressure chronic ulcer of other part of left lower leg with other specified 01/17/2020 No Yes severity L97.328 Non-pressure chronic ulcer of left ankle with other specified severity 01/17/2020 No Yes Inactive Problems ICD-10 Code Description Active Date Inactive Date L03.116 Cellulitis of left lower limb 01/31/2020 01/31/2020 T81.31XD Disruption of external operation (surgical) wound, not elsewhere  classified, subsequent 01/17/2020 01/17/2020 encounter Resolved Problems Electronic Signature(s) Signed: 12/19/2020 10:07:05 AM By: [name] PA-C Entered By: [name] on 12/19/2020 10:07:04 -------------------------------------------------------------------------------- Progress Note Details Patient Name: Date of Service: [facility] [name], [name] 12/19/2020 10:00 A M Medical Record Number: [id number] Patient Account Number: [id number] Date of Birth/Sex: Treating RN: 09-Jun-1955 (65 y.o. [name] Provider: Other Clinician: [name] Referring Provider: Treating Provider/Extender: [name] in Treatment: 24 Subjective Chief Complaint Information obtained from Patient patient is been followed long-term in this clinic for venous insufficiency ulcers with inflammation, hypertension and ulceration over the medial ankle bilaterally. 01/17/2020; this is a patient who is here for review of postoperative wounds on the left lateral ankle and recurrence of venous stasis ulceration on the left medial History of Present Illness (HPI) the remaining wound is over the left medial ankle. Similar wound over the right medial ankle healed largely with use of Apligraf. Most recently we have been using Hydrofera Blue over this wound with considerable improvement. The patient has been extensively worked up in the past for her venous insufficiency and she is not a candidate for antireflux surgery although I have none of the details available currently. 08/24/14; considerable improvement today. About 50% of this wound areas now epithelialized. The base of the wound appears to be healthier granulation.as opposed to last week when she had deteriorated a considerable improvement 08/17/14; unfortunately the wound has regressed somewhat. The areas of epithelialization from the superior aspect are not nearly as healthy as they were last week. The patient thinks  her Hydrofera Blue slipped. 09/07/14; unfortunately the area has markedly regressed in the 2 weeks since I've seen this. There is an odor surrounding erythema. The healthy granulation tissue that we had at the base of the wound now is a dusky color. The nurse reports green drainage 09/14/14; the area looks somewhat better than last week. There is less erythema and less drainage. The culture I did did not show any growth. Nevertheless I think it is better to continue the Cipro and doxycycline for a further week. The remaining wound area was debridement. 09/21/14. Wound did not require debridement last week. Still less erythema and less drainage. She can complete her antibiotics. The areas of epithelialization in the superior aspect of the wound do not look as healthy as they  did some weeks ago 10/05/14 continued improvement in the condition of this wound. There is advancing epithelialization. Less aggressive debridement required 10/19/14 continued improvement in the condition and volume of this wound. Less aggressive debridement to the inferior part of this to remove surface slough and fibrinous eschar 11/02/14 no debridement is required. The surface granulation appears healthy although some of her islands of epithelialization seem to have regressed. No evidence of infection 11/16/14; lites surface debridement done of surface eschar. The wound does not look to be unhealthy. No evidence of infection. Unfortunately the patient has had podiatry issues in the right foot and for some reason has redeveloped small surface ulcerations in the medial right ankle. Her original presentation involved wounds in this area 11/23/14 no debridement. The area on the right ankle has enlarged. The left ankle wound appears stable in terms of the surface although there is periwound inflammation. There has been regression in the amount of new skin 11/30/14 no debridement. Both wound areas appear healthy. There was no evidence of  infection. The the new area on the right medial ankle has enlarged although that both the surfaces appear to be stable. 12/07/14; Debridement of the right medial ankle wound. No no debridement was done on the left. 12/14/14 no major change in and now bilateral medial ankle wounds. Both of these are very painful but the no overt evidence of infection. She has had previous venous ablation 12/21/14; patient states that her right medial ankle wound is considerably more painful last week than usual. Her left is also somewhat painful. She could not tolerate debridement. The right medial ankle wound has fibrinous surface eschar 12/28/14 this is a patient with severe bilateral venous insufficiency ulcers. For a considerable period of time we actually had the one on the right medial ankle healed however this recently opened up again in June. The left medial ankle wound has been a refractory area with some absent flows. We had some success with Hydrofera Blue on this area and it literally closed by 50% however it is recently opened up Foley. Both of these were debridement today of surface eschar. She tolerates this poorly 01/25/15: No change in the status of this. Thick adherent escar. Very poor tolerance of any attempt at debridement. I had healed the right medial malleolus wound for a considerable amount of time and had the left one down to about 50% of the volume although this is totally regressed over the last 48 weeks. Further the right leg has reopened. she is trying to make a appointment with pain and vascular, previous ablations with Dr. Aleda Grana. I do not believe there is an arterial insufficiency issue here 02/01/15 the status of the adherent eschar bilaterally is actually improved. No debridement was done. She did not manage to get vascular studies done 02/08/15 continued debridement of the area was done today. The slough is less adherent and comes off with less pressure. There is no surrounding  infection peripheral pulses are intact 02/15/15 selective debridement with a disposable curette. Again the slough is less adherent and comes off with less difficulty. No surrounding infection peripheral pulses are intact. 02/22/15 selective debridement of the right medial ankle wound. Slough comes off with less difficulty. No obvious surrounding infection peripheral pulses are intact I did not debridement the one on the left. Both of these are stable to improved 03/01/15 selective debridement of both wound areas using a curette to. Adherent slough cup soft with less difficulty. No obvious surrounding infection. The patient tells me  that 2 days ago she noted a rash above the right leg wrap. She did not have this on her lower legs when she change this over she arrives with widespread left greater than right almost folliculitis-looking rash which is extremely pruritic. I don't see anything to culture here. There is no rash on the rest of her body. She feels well systemically. 03/08/15; selective debridement of both wounds using a curette. Base of this does not look unhealthy. She had limegreen drainage coming out of the left leg wound and describes a lot of drainage. The rash on her left leg looks improved to. No cultures were done. 03/22/15; patient was not here last week. Basal wounds does not look healthy and there is no surrounding erythema. No drainage. There is still a rash on the left leg that almost looks vasculitic however it is clearly limited to the top of where the wrap would be. 04/05/15; on the right required a surgical debridement of surface eschar and necrotic subcutaneous tissue. I did not debridement the area on the left. These continue to be large open wounds that are not changing that much. We were successful at one point in healing the area on the right, and at the same time the area on the left was roughly half the size of current measurements. I think a lot of the deterioration has to  do with the prolonged time the patient is on her feet at work 04/19/15 I attempted-like surface debridement bilaterally she does not tolerate this. She tells me that she was in allergic care yesterday with extreme pain over her left lateral malleolus/ankle and was told that she has an "sprain" 05/03/15; large bilateral venous insufficiency wounds over the medial malleolus/medial aspect of her ankles. She complains of copious amounts of drainage and his usual large amounts of pain. There is some increasing erythema around the wound on the right extending into the medial aspect of her foot to. historically she came in with these wounds the right one healed and the left one came down to roughly half its current size however the right one is reopened and the left is expanded. 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 05/10/15 large bilateral venous insufficiency wounds. There is less adherence surface left however the surface culture that I did last week grew pseudomonas therefore bilateral selective debridement score necessary. There is surrounding erythema. The patient describes severe bilateral drainage and a lot of pain in the left ankle. Apparently her podiatrist was were ready to do a cortisone shot 05/17/15; the patient complains of pain and again copious amounts of drainage. 05/24/15; we used Iodo flex last week. Patient notes considerable improvement in wound drainage. Only needed to change this once. 05/31/15; we continued Iodoflex; the base of these large wounds bilaterally is not too bad but there is probably likely a significant bioburden here. I would like to debridement just doesn't tolerate it. 06/06/14 I would like to continue the Iodoflex although she still hasn't managed to obtain supplies. She has bilateral medial malleoli or large wounds which are mostly superficial. Both of them are covered circumferentially with some nonviable  fibrinous slough although she tolerates debridement very poorly. She apparently has an appointment for an ablation on the right leg by interventional radiology. 06/14/15; the patient arrives with the wounds and static condition. We attempted a debridement although she does not do well with this secondary to pain. I 07/05/15;  wounds are not much smaller however there appears to be a cleaner granulating base. The left has tight fibrinous slough greater than the right. Debridement is tolerated poorly due to pain. Iodoflex is done more for these wounds in any of the multitude of different dressings I have tried on the left 1 and then subsequently the right. 07/12/15; no change in the condition of this wound. I am able to do an aggressive debridement on the right but not the left. She simply cannot tolerate it. We have been using Iodoflex which helps somewhat. It is worthwhile remembering that at one point we healed the right medial ankle wound and the left was about 25% of the current circumference. We have suggested returning to vascular surgery for review of possible further ablations for one reason or another she has not been able to do this. 07/26/15 no major change in the condition of either wound on her medial ankle. I did not attempt to debridement of these. She has been aggressively scrubbing these while she is in the shower at home. She has her supply of Iodoflex which seems to have done more for these wounds then anything I have put on recently. 08/09/15 wound area appears larger although not verified by measurements. Using Iodoflex 09/05/2015 -- she was here for avisit today but had significant problems with the wound and I was asked to see her for a physician opinion. I have summarize that this lady has had surgery on her left lower extremity about 10 years ago where the possible veins stripping was done. She has had an opinion from interventional radiology around November 2016 where no further  sclerotherapy was ordered. The patient works 12 hours a day and stands on a concrete floor with work boots and is unable to get the proper compression she requires and cannot elevate her limbs appropriately at any given time. She has recently grown Pseudomonas from her wound culture but has not started her ciprofloxacin which was called in for her. 09/13/15 this continues to be a difficult situation for this patient. At one point I had this wound down to a 1.5 x 1.5" wound on her left leg. This is deteriorated and the right leg has reopened. She now has substantial wounds on her medial calcaneus, malleoli and into her lower leg. One on the left has surface eschar but these are far too painful for me to debridement here. She has a vascular surgery appointment next week to see if anything can be done to help here. I think she has had previous ablations several years ago at [facility]. She has no major edema. She tells me that she did not get product last time [facility] Ag] and went for several days without it. She continues to work in work boots 12 hours a day. She cannot get compression/4-layer under her work boots. 09/20/15 no major change. Periwound edema control was not very good. Her point with pain and vascular is next Wednesday the 25th 09/28/15; the patient is seen vascular surgery and is apparently scheduled for repeat duplex ultrasounds of her bilateral lower legs next week. 10/05/15; the patient was seen by Dr. Doren Custard of vascular surgery. He feels that she should have arterial insufficiency excluded as cause/contributed to her nonhealing stage she is therefore booked for an arteriogram. She has apparently monophasic signals in the dorsalis pedis pulses. She also of course has known severe chronic venous insufficiency with previous procedures as noted previously. I had another long discussion with the patient today  about her continuing to work 12 hour shifts. I've written her out for 2 months area  had concerns about this as her work location is currently undergoing significant turmoil and this may lead to her termination. She is aware of this however I agree with her that she simply cannot continue to stand for 12 hours multiple days a week with the substantial wound areas she has. 10/19/15; the Dr. Doren Custard appointment was largely for an arteriogram which was normal. She does not have an arterial issue. He didn't make a comment about her chronic venous insufficiency for which she has had previous ablations. Presumably it was not felt that anything additional could be done. The patient is now out of work as I prescribed 2 weeks ago. Her wounds look somewhat less aggravated presumably because of this. I felt I would give debridement another try today 10/25/15; no major change in this patient's wounds. We are struggling to get her product that she can afford into her own home through her insurance. 11/01/15; no major change in the patient's wounds. I have been using silver alginate as the most affordable product. I spoke to Dr. Marla Roe last week with her requested take her to the OR for surgical debridement and placement of ACEL. Dr. Marla Roe told me that she would be willing to do this however [facility] will not cover this, fortunately the patient has [place] healthcare of some variant 11/08/15; no major change in the patient's wounds. She has been completely nonviable surface that this but is in too much pain with any attempted debridement are clinic. I have arranged for her to see Dr. Marla Roe ham of plastic surgery and this appointment is on Monday. I am hopeful that they will take her to the OR for debridement, possible ACEL ultimately possible skin graft 11/22/15 no major change in the patient's wounds over her bilateral medial calcaneus medial malleolus into the lower legs. Surface on these does not look too bad however on the left there is surrounding erythema and tenderness.  This may be cellulitis or could him sleepy tinea. 11/29/15; no major changes in the patient's wounds over her bilateral medial malleolus. There is no infection here and I don't think any additional antibiotics are necessary. There is now plan to move forward. She sees Dr. Marla Roe in a week's time for preparation for operative debridement and ACEL placement I believe on 7/12. She then has a follow-up appointment with Dr. Marla Roe on 7/21 12/28/15; the patient returns today having been taken to the [place] by Dr. Marla Roe 12/12/15 she underwent debridement, intraoperative cultures [which were negative]. She had placement of a wound VAC. Parent really ACEL was not available to be placed. The wound VAC foam apparently adhered to the wound since then she's been using silver alginate, Xeroform under Ace wraps. She still says there is a lot of drainage and a lot of pain 01/31/16; this is a patient I see monthly. I had referred her to Dr. Marla Roe him of plastic surgery for large wounds on her bilateral medial ankles. She has been to the OR twice once in early July and once in early August. She tells me over the last 3 weeks she has been using the wound VAC with ACEL underneath it. On the right we've simply been using silver alginate. Under Kerlix Coban wraps. 02/28/16; this is a patient I'm currently seeing monthly. She is gone on to have a skin graft over her large venous insufficiency ulcer on the left medial ankle.  This was done by Dr. Marla Roe him. The patient is a bit perturbed about why she didn't have one on her right medial ankle wound. She has been using silver alginate to this. 03/06/16; I received a phone call from her plastic surgery Dr. Marla Roe. She expressed some concern about the viability of the skin graft she did on the left medial ankle wound. Asked me to place Endoform on this. She told me she is not planning to do a subsequent skin graft on the right as the left one did not take very  well. I had placed Hydrofera Blue on the right 03/13/16; continue to have a reasonably healthy wound on the right medial ankle. Down to 3 mm in terms of size. There is epithelialization here. The area on the left medial ankle is her skin graft site. I suppose the last week this looks somewhat better. She has an open area inferiorly however in the center there appears to be some viable tissue. There is a lot of surface callus and eschar that will eventually need to come off however none of this looked to be infected. Patient states that the is able to keep the dressing on for several days which is an improvement. 03/20/16 no major change in the circumference of either wound however on the left side the patient was at Dr. Eusebio Friendly office and they did a debridement of left wound. 50% of the wound seems to be epithelialized. I been using Endoform on the left Hydrofera Blue in the right 03/27/16; she arrives today with her wound is not looking as healthy as they did last week. The area on the right clearly has an adherent surface to this a very similar surface on the left. Unfortunately for this patient this is all too familiar problem. Clearly the Endoform is not working and will need to change that today that has some potential to help this surface. She does not tolerate debridement in this clinic very well. She is changing the dressing wants 04/03/16; patient arrives with the wounds looking somewhat better especially on the right. Dr. Migdalia Dk change the dressing to silver alginate when she saw her on Monday and also sold her some compression socks. The usefulness of the latter is really not clear and woman with severely draining wounds. 04/10/16; the patient is doing a bit of an experiment wearing the compression stockings that Dr. Migdalia Dk provided her to her left leg and the out of legs based dressings that we provided to the right. 05/01/16; the patient is continuing to wear compression stockings Dr.  Migdalia Dk provided her on the left that are apparently silver impregnated. She has been using Iodoflex to the right leg wound. Still a moderate amount of drainage, when she leaves here the wraps only last for 4 days. She has to change the stocking on the left leg every night 05/15/16; she is now using compression stockings bilaterally provided by Dr. Marla Roe. She is wearing a nonadherent layer over the wounds so really I don't think there is anything specific being done to this now. She has some reduction on the left wound. The right is stable. I think all healing here is being done without a specific dressing 06/09/16; patient arrives here today with not much change in the wound certainly in diameter to large circular wounds over the medial aspect of her ankle bilaterally. Under the light of these services are certainly not viable for healing. There is no evidence of surrounding infection. She is wearing compression stockings with  some sort of silver impregnation as prescribed by Dr. Marla Roe. She has a follow-up with her tomorrow. 06/30/16; no major change in the size or condition of her wounds. These are still probably covered with a nonviable surface. She is using only her purchase stockings. She did see Dr. Marla Roe who seemed to want to apply Dakin's solution to this I'm not extreme short what value this would be. I would suggest Iodoflex which she still has at home. 07/28/16; I follow Mrs. Baltes episodically along with Dr. Marla Roe. 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 She has been applying a topical collagen ointment to both wounds with Adaptic. I don't think Dr. Marla Roe is planning to take her back to the OR. 08/19/16; I follow Mrs. Jeneen Rinks on a monthly basis along with Dr. Marla Roe of plastic surgery. 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 I been  following her for a number of years. At one point I was able to get the right medial malleolus wound to heal and had the left medial malleolus down to about half its current size however and I had to send her to plastic surgery for an operative debridement. Since then things have been stable to slightly improve the area on the right is slightly better one in the left about the same although there is much less adherent surface than I'm used to with this patient. She is using some form of liquid collagen gel that Dr. Marla Roe provided a Kerlix cover with the patient's own pressure stockings. She tells me that she has extreme pain in both ankles and along the lateral aspect of both feet. She has been unable to work for some period of time. She is telling me she is retiring at the beginning of April. She sees Dr. Doran Durand of orthopedics next week 09/22/16; patient has not seen Dr. Marla Roe since the last time she is here. I'm not really sure what she is using to the wounds other than bits and pieces of think she had left over including most recently Hydrofera Blue. She is using juxtalite stockings. She is having difficulty with her husband's recent illness "stroke". She is having to transport him to various doctors appointments. Dr. Marla Roe left her the option of a repeat debridement with ACEL however she has not been able to get the time to follow-up on this. She continues to have a fair amount of drainage out of these wounds with certainly precludes leaving dressings on all week 10/13/16; patient has not seen Dr. Marla Roe since she was last in our clinic. I'm not really sure what she is doing with the wounds, we did try to get her [facility] and I think she is actually using this most of the time. Because of drainage she states she has to change this every second day although this is an improvement from what she used to do. She went to see Dr. Doran Durand who did not think she had a muscular issue  with regards to her feet, he referred her to a neurologist and I think the appointment is sometime in June. I changed her back to Iodoflex which she has used in the past but not recently. 11/03/16; the patient has been using Iodoflex although she ran out of this. Still claims that there is a lot of drainage although the wound does not look like this. No surrounding erythema. She has not been back to  see Dr. Marla Roe 11/24/16; the patient has been using Iodoflex again but she ran out of it 2 or 3 days ago. There is no major change in the condition of either one of these wounds in fact they are larger and covered in a thick adherent surface slough/nonviable tissue especially on the left. She does not tolerate mechanical debridement in our clinic. Going back to see Dr. Marla Roe of plastic surgery for an operative debridement would seem reasonable. 12/15/16; the patient has not been back to see Dr. Marla Roe. She is been dealing with a series of illnesses and her husband which of monopolized her time. She is been using Sorbact which we largely supplied. She states the drainage is bad enough that it maximum she can go 2-3 days without changing the dressing 01/12/2017 -- the patient has not been back for about 4 weeks and has not seen Dr. Marla Roe not does she have any appointment pending. 01/23/17; patient has not seen Dr. Marla Roe even though I suggested this previously. She is using Santyl that was suggested last week by Dr. Con Memos this Cost her $16 through her insurance which is indeed surprising 02/12/17; continuing Santyl and the patient is changing this daily. A lot of drainage. She has not been back to see plastic surgery she is using an Ace wrap. Our intake nurse suggested wrap around stockings which would make a good reasonable alternative 02/26/17; patient is been using Santyl and changing this daily due to drainage. She has not been to see plastic surgery she uses in April Ace wrap to control  the edema. She did obtain extremitease stockings but stated that the edema in her leg was to big for these 03/20/17; patient is using Santyl and Anasept. Surfaces looked better today the area on the right is actually measuring a little smaller. She has states she has a lot of pain in her feet and ankles and is asking for a consult to pain control which I'll try to help her with through our case manager. 04/10/17; the patient arrives with better-looking wound surfaces and is slightly smaller wound on the left she is using a combination of Santyl and Anasept. She has an appointment or at least as started in the pain control center associated with [facility] 05/14/17; this is a patient who I followed for a prolonged period of time. She has venous insufficiency ulcers on her bilateral medial ankles. At one point I had this down to a much smaller wound on the left however these reopened and we've never been able to get these to heal. She has been using Santyl and Anasept gel although 2 weeks ago she ran out of the Anasept gel. She has a stable appearance of the wound. She is going to the wound care clinic at [facility]. They wanted do a nerve block/spinal block although she tells me she is reluctant to go forward with that. 05/21/17; this is a patient I have followed for many years. She has venous insufficiency ulcers on her bilateral medial ankles. Chronic pain and deformity in her ankles as well. She is been to see plastic surgery as well as orthopedics. Using PolyMem AG most recently/Kerramax/ABDs and 2 layer compression. She has managed to keep this on and she is coming in for a nurse check to change the dressing on Tuesdays, we see her on Fridays 06/05/17; really quite a good looking surface and the area especially on the right medial has contracted in terms of dimensions. Well granulated healthy-looking tissue on  both sides. Even with an open curet there is nothing that even feels abnormal  here. This is as good as I've seen this in quite some time. We have been using PolyMem AG and bringing her in for a nurse check 06/12/17; really quite good surface on both of these wounds. The right medial has contracted a bit left is not. We've been using PolyMem and AG and she is coming in for a nurse visit 06/19/17; we have been using PolyMem AG and bringing her in for a nurse check. Dimensions of her wounds are not better but the surfaces looked better bilaterally. She complained of bleeding last night and the left wound and increasing pain bilaterally. She states her wound pain is more neuropathic than just the wounds. There was some suggestion that this was radicular from her pain management doctor in talking to her it is really difficult to sort this out. 06/26/17; using PolyMem and AG and bringing her in for a nurse check as All of this and reasonably stable condition. Certainly not improved. The dimensions on the lateral part of the right leg look better but not really measuring better. The medial aspect on the left is about the same. 07/03/16; we have been using PolyMen AG and bringing her in for a nurse check to change the dressings as the wounds have drainage which precludes once weekly changing. We are using all secondary absorptive dressings.our intake nurse is brought up the idea of using a wound VAC/snap VAC on the wound to help with the drainage to see if this would result in some contraction. This is not a bad idea. The area on the right medial is actually looking smaller. Both wounds have a reasonable-looking surface. There is no evidence of cellulitis. The edema is well controlled 07/10/17; the patient was denied for a snap VAC by her insurance. The major issue with these wounds continues to be drainage. We are using wicked PolyMem AG and she is coming in for a nurse visit to change this. The wounds are stable to slightly improved. The surface looks vibrant and the area on the  right certainly has shrunk in size but very slowly 07/17/17; the patient still has large wounds on her bilateral medial malleoli. Surface of both of these wounds looks better. The dimensions seem to come and go but no consistent improvement. There is no epithelialization. We do not have options for advanced treatment products due to insurance issues. They did not approve of the wound VAC to help control the drainage. More recently we've been using PolyMem and AG wicked to allow drainage through. We have been bringing her in for a nurse visit to change this. We do not have a lot of options for wound care products and the home again due to insurance issues 07/24/17; the patient's wound actually looks somewhat better today. No drainage measurements are smaller still healthy-looking surface. We used silver collagen under PolyMen started last week. We have been bringing her in for a dressing change 07/31/17; patient's wound surface continued to look better and I think there is visible change in the dimensions of the wound on the right. Rims of epithelialization. We have been using silver collagen under PolyMen and bringing her in for a dressing change. There appears to be less drainage although she is still in need of the dressing change 08/07/17. Patient's wound surface continues to look better on both sides and the area on the right is definitely smaller. We have been using silver collagen  and PolyMen. She feels that the drainage has been it has been better. I asked her about her vascular status. She went to see Dr. Aleda Grana at [facility] and had some form of ablation. I don't have much detail on this. I haven't my notes from 2016 that she was not a candidate for any further ablation but I don't have any more information on this. We had referred her to vein and vascular I don't think she ever went. He does not have a history of PAD although I don't have any information on this either. We don't even have  ABIs in our record 08/14/17; we've been using silver collagen and PolyMen cover. And putting the patient and compression. She we are bringing her in as a nurse visit to change this because ofarge amount of drainage. We didn't the ABIs in clinic today since they had been done in many moons 1.2 bilaterally. She has been to see vein and vascular however this was at [facility] and she had ablation although I really don't have any information on this all seemed biking get a report. She is also been operatively debrided by plastic surgery and had a cell placed probably 8-12 months ago. This didn't have a major effect. We've been making some gains with current dressings 08/19/17-She is here in follow-up evaluation for bilateral medial malleoli ulcers. She continues to tolerate debridement very poorly. We will continue with recently changed topical treatment; if no significant improvement may consider switching to Iodosorb/Iodoflex. 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 She has been using silver collagen and PolyMem. I believe she has been used and tried on Iodoflex before. 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 These have been very refractory over time. We have been using silver collagen and PolyMen. She is coming in once a week for a doctor's and once a week for nurse visits. We are actually making some progress 09/18/17; the patient's wounds are smaller especially on the right medial. She arrives today to upset to consider even washing these off with Anasept which I think is been part of the reason this is been closing. We've been using collagen covered in PolyMen otherwise. It is noted that she has a small area of folliculitis on the right medial calf that.  As we are wrapping her legs I'll give her a short course of doxycycline to make sure this doesn't amount to anything. She is a long list of complaints today including imbalance, shortness of breath on exertion, inversion of her left ankle. With regards to the latter complaints she is been to see orthopedics and they offered her a tendon release surgery I believe but wanted her wounds to be closed first. I have recommended she go see her primary physician with regards to everything else. 09/25/17; patient's wounds are about the same size. We have made some progress bilaterally although not in recent weeks. She will not allow me T wash these wounds with Anasept even if she is doing her cell. Wheeze we've been using collagen covered in PolyMen. Last week she had a small area of folliculitis this is now opened into a small wound. She completed 5 days of trimethoprim sulfamethoxazole 10/02/17; unfortunately the area on her left medial ankle is worse with a larger wound area  towards the Achilles. The patient complains of a lot of pain. She will not allow debridement although visually I don't think there is anything to debridement in any case. We have been using silver collagen and PolyMen for several months now. Initially we are making some progress although I'm not really seeing that today. We will move back to [facility]. His admittedly this is a bit of a repeat however I'm hoping that his situation is different now. The patient tells me she had her leg on the left give out on her yesterday this is process some pain. 10/09/17; the patient is seen twice a week largely because of drainage issues coming out of the chronic medial bimalleolar wounds that are chronic. Last week the dimensions of the one on the left looks a little larger I changed her to [facility]. She comes in today with a history of terrible pain in the bilateral wound areas. She will not allow debridement. She will not even allow a tissue  culture. There is no surrounding erythema no no evidence of cellulitis. We have been putting her Kerlix Coban man. She will not allow more aggressive compression as there was a suggestion to put her in 3 layer wraps. 10/16/17; large wounds on her bilateral medial malleoli. These are chronic. Not much change from last week. The surface looks have healthy but absolutely no epithelialization. A lot of pain little less so of drainage. She will not allow debridement or even washing these off in the vigorous fashion with Anasept. 10/23/17; large wounds on her bilateral malleoli which are chronic. Some improvement in terms of size perhaps on the right since last time I saw these. She states that after we increased the 3 layer compression there was some bleeding, when she came in for a nurse visit she did not want 3 layer compression put back on about our nurse managed to convince her. She has known chronic venous visit issues and I'm hoping to get her to tolerate the 3 layer compression. using Hydrofera Blue 10/30/17; absolutely no change in the condition of either wound although we've had some improvement in dimensions on the right.. Attempted to put her in 3 layer compression she didn't tolerated she is back in 2 layer compression. We've been using Hydrofera Blue We looked over her past records. She had venous reflux studies in November 2016. There was no evidence of deep venous reflux on the right. Superficial vein did not show the greater saphenous vein at think this is been previously ablated the small saphenous vein was within normal limits. The left deep venous system showed no DVT the vessels were positive for deep venous reflux in the posterior tibial veins at the ankle. The greater saphenous vein was surgically absent small saphenous vein was within normal limits. She went to vein and vascular at [facility]. I believe she had an ablation on the left greater saphenous vein. I'll update her reflux  studies perhaps ever reviewed by vein and vascular. We've made absolutely no progress in these wounds. Will also try to read and TheraSkins through her insurance 11/06/17; W the patient apparently has a 2 week follow-up with vein and vascular I like him to review the whole issue with regards to her previous vascular workup by Dr. Aleda Grana. We've really made no progress on these wounds in many months. She arrives today with less viable looking surface on the left medial ankle wound. This was apparently looking about the same on Tuesday when she was here  for nurse visit. 11/13/17; deep tissue culture I did last time of the left lower leg showed multiple organisms without any predominating. In particular no Staphylococcus or group A strep were isolated. We sent her for venous reflux studies. She's had a previous left greater saphenous vein stripping and I think sclerotherapy of the right greater saphenous vein. She didn't really look at the lesser saphenous vein this both wounds are on the medial aspect. She has reflux in the common femoral vein and popliteal vein and an accessory vein on the right and the common femoral vein and popliteal vein on the left. I'm going to have her go to see vein and vascular just the look over things and see if anything else beside aggressive compression is indicated here. We have not been able to make any progress on these wounds in spite of the fact that the surface of the wounds is never look too bad. 11/20/17; no major change in the condition of the wounds. Patient reports a large amount of drainage. She has a lot of complaints of pain although enlisting her today I wonder if some of this at least his neuropathic rather than secondary to her wounds. She has an appointment with vein and vascular on 12/30/17. The refractory nature of these wounds in my mind at least need vein and vascular to look over the wounds the recent reflux studies we did and her history to see if  anything further can be done here. I also note her gait is deteriorated quite a bit. 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 She has a bilateral Trendelenburg gait. I wonder if this is neuropathic or perhaps multilevel radicular. 11/27/17; her wounds actually looks slightly better. Healthy-looking granulation tissue a scant amount of epithelialization. [place] healthcare will not pay for [employer]. They will play for tri layer Oasis and Dermagraft. This is not a diabetic ulcer. We'll try for the tri layer Oasis. She still complains of some drainage. She has a vein and vascular appointment on 12/30/17 12/04/17; the wounds visually look quite good. Healthy-looking granulation with some degree of epithelialization. We are still waiting for response to our request for trial to try layer Oasis. Her appointment with vascular to review venous and arterial issues isn't sold the end of July 7/31. Not allow debridement or even vigorous cleansing of the wound surface. 12/18/17; slightly smaller especially on the right. 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 We've been using Hydrofera Blue. We still are looking into triple layer Oasis through her insurance 01/08/18 on evaluation today patient's wound actually appears to be showing signs of good improvement at this point in time. She has been tolerating the dressing changes without complication. Fortunately there does not appear to be any evidence of infection at this point in time. We have been utilizing silver nitrate which does seem to be of benefit for her which is also good news. Overall I'm very happy with how things seem to be both regards appearance as well as measurement. Patient did see Dr. Bridgett Larsson for evaluation on 12/30/17. In his assessment he felt that stripping would not likely add much more than chronic compression to the patient's healing process. His recommendation was to follow-up in three months with Dr. Doren Custard  if she hasn't healed in order to consider referral back to you and see vascular where she previously was in a trial and was able to get her wound to heal. I'll be see what she feels she when  you staying compression and he reiterated this as well. 01/13/18 on evaluation today patient appears to actually be doing very well in regard to her bilateral medial malleolus ulcers. She seems to have tolerated the chemical cauterization with silver nitrate last week she did have some pain through that evening but fortunately states that I'll be see since it seems to be doing better she is overall pleased with the progress. 01/21/18; really quite a remarkable improvement since I've last seen these wounds. We started using silver nitrate specially on the islands of hyper granulation which for some reason her around the wound circumference. This is really done quite nicely. Primary dressing Hydrofera Blue under 4 layer compression. She seems to be able to hold out without a nurse rewrap. Follow-up in 1 week 01/28/18; we've continued the hydrofera blue but continued with chemical cauterization to the wound area that we started about a month ago for irregular hyper granulation. She is made almost stunning improvement in the overall wound dimensions. I was not really expecting this degree of improvement in these chronic wounds 02/05/18; we continue with Hydrofera Bluebut of also continued the aggressive chemical cauterization with silver nitrate. We made nice progress with the right greater than left wound. 02/12/18. We continued with Hydrofera Blue after aggressive chemical cauterization with silver nitrate. We appear to be making nice progress with both wound areas 02/19/2018; we continue with [facility] after washing the wounds vigorously with Anasept spray and chemical cauterization with silver nitrate. We are making excellent progress. The area on the right's just about closed 02/26/2018. The area on the left  medial ankle had too much necrotic debris today. I used a #5 curette we are able to get most of the soft. I continued with the silver nitrate to the much smaller wound on the right medial ankle she had a new area on her right lower pretibial area which she says was due to a role in her compression 03/05/2018; both wound areas look healthy. Not much change in dimensions from last week. I continue to use silver nitrate and Hydrofera Blue. The patient saw Dr. Doren Custard of vein and vascular. He felt she had venous stasis ulcers. He felt based on her previous arteriogram she should have adequate circulation for healing. Also she has deep venous reflux but really no significant correctable superficial venous reflux at this time. He felt we should continue with conservative management including leg elevation and compression 04/02/2018; since we last saw this woman about a month ago she had a fall apparently suffered a pelvic fracture. I did not look up the x-ray. Nevertheless because of pain she literally was bedbound for 2 weeks and had home health coming out to change the dressing. Somewhat predictably this is resulted in considerable improvement in both wound areas. The right is just about closed on the medial malleolus and the left is about half the size. 04/16/2018; both her wounds continue to go down in size. Using Hydrofera Blue. 05/07/18; both her wounds appeared to be improving especially on the right where it is almost closed. We are using Hydrofera Blue 05/14/2018; slightly worse this week with larger wounds. Surface on the left medial not quite as good. We have been using Hydrofera Blue 05/21/18; again the wounds are slightly larger. Left medial malleolus slightly larger with eschar around the circumference. We have been using Hydrofera Blue undergoing a wraps for a prolonged period of time. This got a lot better when she was more recumbent due to a fall  and a back injury. I change the primary dressing  the silver alginate today. She did not tolerate a 4 layer compression previously although I may need to bring this up with her next time 05/28/2018; area on the left medial malleolus again is slightly larger with more drainage. Area on the right is roughly unchanged. She has a small area of folliculitis on the right medial just on the lower calf. This does not look ominous. 06/03/2018 left medial malleolus slightly smaller in a better looking surface. We used silver nitrate on this last time with silver alginate. The area on the right appears slightly smaller 1/10; left medial malleolus slightly smaller. Small open area on the right. We used silver nitrate and silver alginate as of 2 weeks ago. We continue with the wound and compression. These got a lot better when she was off her feet 1/17; right medial malleolus wound is smaller. The left may be slightly smaller. Both surfaces look somewhat better. 1/24; both wounds are slightly smaller. Using silver alginate under Unna boots 1/31; both wounds appear smaller in fact the area on the right medial is just about closed. Surface eschar. We have been using silver alginate under Unna boots. The patient is less active now spends let much less time on her feet and I think this is contributed to the general improvement in the wound condition 2/7; both wounds appear smaller. I was hopeful the right medial would be closed however there there is still the same small open area. Slight amount of surface eschar on the left the dimensions are smaller there is eschar but the wound edges appear to be free. We have been using silver alginate under Unna boot's 2/14; both wounds once again measure smaller. Circumferential eschar on the left medial. We have been using silver alginate under Unna boots with gradual improvement 2/21; the area on the right medial malleolus has healed. The area on the left is smaller. We have been using silver alginate and Unna boots. We can  discharge wrapping the right leg she has 20/30 stockings at home she will need to protect the scar tissue in this area 2/28; the area on the right medial malleolus remains closed the patient has a compression stocking. The area on the left is smaller. We have been using silver alginate and Unna boots. 3/6 the area on the right medial ankle remains closed. Good edema control noted she is using her own compression stocking. The area on the left medial ankle is smaller. We have been managing this with silver alginate and Unna boots which we will continue today. 3/13; the area on the right medial ankle remains closed and I'm declaring it healed today. When necessary the left is about the same still a healthy-looking surface but no major change and wound area. No evidence of infection and using silver alginate under unna and generally making considerable improvement 3/27 the area on the right medial ankle remains closed the area on the left is about the same as last week. Certainly not any worse we have been using silver alginate under an Unna boot 4/3; the area on the right medial ankle remains closed per the patient. We did not look at this wound. The wound on the left medial ankle is about the same surface looks healthy we have been using silver alginate under an Unna boot 4/10; area on the right medial ankle remains closed per the patient. We did not look at this wound. The wound on the left medial  ankle is slightly larger. The patient complains that the [facility] caused burning pain all week. She also told us that she was a lot more active this week. Changed her back to silver alginate 4/17; right medial ankle still closed per the patient. Left medial ankle is slightly larger. Using silver alginate. She did not tolerate Hydrofera Blue on this area 4/24; right medial ankle remains closed we have not look at this. The left medial ankle continues to get larger today by about a centimeter. We have  been using silver alginate under Unna boots. She complains about 4 layer compression as an alternative. She has been up on her feet working on her garden 5/8; right medial ankle remains closed we did not look at this. The left medial ankle has increased in size about 100%. We have been using silver alginate under Unna boots. She noted increased pain this week and was not surprised that the wound is deteriorated 5/15; no major change in SA however much less erythema ( one week of doxy ocellulitis). 5/22-65 year old female returns at 1 week to the clinic for left medial ankle wound for which we have been using silver alginate under 3 layer compression She was placed on DOXY at last visit - the wound is wider at this visit. She is in 3 layer compression 5/29; change to [facility] last week. I had given her empiric doxycycline 2 weeks ago for a week. She is in 3 layer compression. She complains of a lot of pain and drainage on presentation today. 6/5; using Hydrofera Blue. I gave her doxycycline recently empirically for erythema and pain around the wound. Believe her cultures showed enterococcus which not would not have been well covered by doxycycline nevertheless the wound looks better and I don't feel specifically that the enterococcus needs to be covered. She has a new what looks like a wrap injury on her lateral left ankle. 6/12; she is using Hydrofera Blue. She has a new area on the left anterior lower tibial area. This was a wrap injury last week. 6/19; the patient is using Hydrofera Blue. She arrived with marked inflammation and erythema around the wound and tenderness. 12/01/18 on evaluation today patient appears to be doing a little bit better based on what I'm hearing from the standpoint of lassos evaluation to this as far as the overall appearance of the wound is concerned. Then sometime substandard she typically sees Dr. Dellia Nims. Nonetheless overall very pleased with the progress that she's  made up to this point. No fevers, chills, nausea, or vomiting noted at this time. 7/10; some improvement in the surface area. Aggressively debrided last week apparently. I went ahead with the debridement today although the patient does not tolerate this very well. We have been using Iodoflex. Still a fair amount of drainage 7/17; slightly smaller. Using Iodoflex. 7/24; no change from last week in terms of surface area. We have been using Iodoflex. Surface looks and continues to look somewhat better 7/31; surface area slightly smaller better looking surface. We have been using Iodoflex. This is under Unna boot compression 8/7-Patient presents at 1 week with Unna boot and Iodoflex, wound appears better 8/14-Patient presents at 1 week with Iodoflex, we use the Unna boot, wound appears to be stable better.Patient is getting Botox treatment for the inversion of the foot for tendon release, Next week 8/21; we are using Iodoflex. Unna boot. The wound is stable in terms of surface area. Under illumination there is some areas of the wound  that appear to be either epithelialized or perhaps this is adherent slough at this point I was not really clear. It did not wipe off and I was reluctant to debride this today. 8/28; we are using Iodoflex in an Unna boot. Seems to be making good improvement. 9/4; using Iodoflex and wound is slightly smaller. 9/18; we are using Iodoflex with topical silver nitrate when she is here. The wound continues to be smaller 10/2; patient missed her appointment last week due to GI issues. She left and Iodoflex based dressing on for 2 weeks. Wound is about the same size about the size of a dime on the left medial lower 10/9 we have been using Iodoflex on the medial left ankle wound. She has a new superficial probable wrap injury on the dorsal left ankle 10/16; we have been using Hydrofera Blue since last week. This is on the left medial ankle 10/23; we have been using Hydrofera Blue  since 2 weeks ago. This is on the left medial ankle. Dimensions are better 11/6; using Hydrofera Blue. I think the wound is smaller but still not closed. Left medial ankle 11/13; we have been using Hydrofera Blue. Wound is certainly no smaller this week. Also the surface not as good. This is the remanent of a very large area on her left medial ankle. 11/20; using Sorbact since last week. Wound was about the same in terms of size although I was disappointed about the surface debris 12/11; 3-week follow-up. Patient was on vacation. Wound is measuring slightly larger we have been using Sorbact. 12/18; wound is about the same size however surface looks better last week after debridement. We have been using Sorbact under compression 1/15 wound is probably twice the size of last time increased in length nonviable surface. We have been using Sorbact. She was running a mild fever and missed her appointment last week 1/22; the wound is come down in size but under illumination still a very adherent debris we have been Hydrofera Blue that I changed her to last week 1/29; dimensions down slightly. We have been using Hydrofera Blue 2/19 dimensions are the same however there is rims of epithelialization under illumination. Therefore more the surface area may be epithelialized 2/26; the patient's wound actually measures smaller. The wound looks healthy. We have been using Hydrofera Blue. I had some thoughts about running Apligraf then I still may do that however this looks so much better this week we will delay that for now 3/5; the wound is small but about the same as last week. We have been using Hydrofera Blue. No debridement is required today. 3/19; the wound is about the size of a dime. Healthy looking wound even under illumination. We have been using Hydrofera Blue. No mechanical debridement is necessary 3/26; not much change from last week although still looks very healthy. We have been using Hydrofera Blue  under Unna boots Patient was offered an ankle fusion by podiatry but not until the wound heals with a proceed with this. 4/9; the patient comes in today with her original wound on the medial ankle looking satisfactory however she has some uncontrolled swelling in the middle part of her leg with 2 new open areas superiorly just lateral to the tibia. I think this was probably a wrap issue. She said she felt uncomfortable during the week but did not call in. We have been using Hydrofera Blue 4/16; the wound on the medial ankle is about the same. She has innumerable small areas superior  to this across her mid tibia. I think this is probably folliculitis. She is also been working in the yard doing a lot of sweating 4/30; the patient issue on the upper areas across her mid tibia of all healed. I think this was excessive yard work if I remember. Her wound on the medial ankle is smaller. Some debris on this we have been using Hydrofera Blue under Unna boots 5/7; mid tibia. She has been using Hydrofera Blue under an Unna wrap. She is apparently going for her ankle surgery on June 3 10/28/19-Patient returns to clinic with the ankle wound, we are using Hydrofera Blue under Unna wrap, surgery is scheduled for her left foot for June 3 so she will be back for nurse visit next week READMISSION 01/17/2020 Mrs. Polselli is a 65 year old woman we have had in this clinic for a long period of time with severe venous hypertension and refractory wounds on her medial lower legs and ankles bilaterally. 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 When she was off her legs for a prolonged period example when she fell and suffered a compression fracture things would heal up quite nicely. She is now retired and we managed to heal up the right medial leg wound. The left one was very tiny last time I saw  this although still refractory. 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 It got to the point where this was interfering with her walking and she elected to proceed with a ankle arthrodesis to straighten her her ankle and leave her with a functional outcome for mobilization. The patient was referred to Dr. Doren Custard and really this took some time to arrange. Dr. Doren Custard saw her on 12/07/2019. Once again he verified that she had no arterial issues. She had previously had an angiogram several years ago. Follow-up ABIs on the left showed an ABI of 1.12 with triphasic waveforms and a TBI of 0.92. She is felt to have chronic deep venous insufficiency but I do not think it was felt that anything could be done from about this from an ablation point of view. At the time Dr. Doren Custard saw this patient the wounds actually look closed via the pictures in his clinic. The patient finally underwent her surgery on 12/15/2019. This went reasonably well and there was a good anatomic outcome. She developed a small distal wound dehiscence on the lateral part of the surgical wound. However more problematically she is developed recurrence of the wound on the medial left ankle. There are actually 2 wounds here one in the distal lower leg and 1 pretty much at the level of the medial malleolus. It is a more distal area that is more problematic. She has been using Hydrofera Blue which started on Friday before this she was simply Ace wrapping. There was a culture done that showed Pseudomonas and she is on ciprofloxacin. A recent CNS on 8/11 was negative. The patient reports some pain but I generally think this is improving. She is using a cam boot completely nonweightbearing using a walker for pivot transfers and a wheelchair 8/24; not much improvement unfortunately she has a surgical wound on the lateral part in the venous insufficiency wound  medially. The bottom part of the medial insufficiency wound is still necrotic there is exposed tendon  here. We have been using Hydrofera Blue under compression. Her edema control is however better 8/31; patient in for follow-up of his surgical wound on the lateral part of her left leg and chronic venous insufficiency ulcers medially. We put her back in compression last week. She comes in today with a complaint of 3 or 4 days worth of increasing pain. She felt her cam walker was rubbing on the area on the back of her heel. However there is intense erythema seems more likely she has cellulitis. She had 2 cultures done when she was seeing podiatry in the postop. One of them in late July showed Pseudomonas and she received a course of ciprofloxacin the other was negative on 8/11 she is allergic to penicillin with anaphylactoid complaints of hives oral swelling via information in epic 9/9; when I saw this patient last week she had intense anterior erythema around her wound on the right lateral heel and ankle and also into the right medial heel. Some of this was no doubt drainage and her walker boot however I was convinced she had cellulitis. I gave her Levaquin and Bactrim she is finishing up on this now. She is following up with Dr. [name] he saw her yesterday. He is taken her out of the walking boot of course she is still nonweightbearing. Her x-ray was negative for any worrisome features such as soft tissue air etc. Things are a lot better this week. She has home health. We have been using Hydrofera Blue under an [employer] which she put back on yesterday. I did not wrap her last week 9/17; her surrounding skin looks a lot better. In fact the area on the left lateral ankle has just a scant amount of eschar. The only remaining wound is the large area on the left medial ankle. Probably about 60% of this is healthy granulation at the surface however she has a significant divot distally. This has  adherent debris in it. I been using debridement and silver collagen to try and get this area to fill-in although I do not think we have made much progress this week 9/24; the patient's wound on the left medial ankle looks a lot better. The deeper divot area distally still requires debridement but this is cleaning up quite nicely we have been using silver collagen. The patient is complaining of swelling in her foot and is worried that that is contributing to the nonhealing of the ankle wound. She is also complaining of numbness in her anterior toes 10/4; left medial ankle. The small area distally still has a divot with necrotic material that I have been debriding away. This has an undermining area. She is approved for Apligraf. She saw Dr. [name] her surgeon on 10/1. I think he declared himself is satisfied with the condition of things. Still nonweightbearing till the end of the month. 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 Her surgical wound is well closed. There is no evidence of infection 10/11; the patient arrived in clinic today with the expectation that we be able to put an Apligraf on this area after debridement however she arrives with a relatively large amount of green drainage on the dressing. The patient states that this started on Friday. She has not been systemically unwell. 10/19; culture I did last week showed both Enterococcus and Pseudomonas. I think this came in separate parts because I stopped her ciprofloxacin I gave her and prescribed her linezolid however now looking at the final  culture result this is Pseudomonas which is resistant to quinolones. She has not yet picked up the linezolid apparently phone issues. We are also trying to get a topical antibiotic out of [place] in [place] they can be applied by home health. She is still having green drainage 10/16; the patient has her topical antibiotic from [facility] in [place]. This  is a compounded gel with vancomycin and ciprofloxacin and gentamicin. We are applying this on the wound bed with silver alginate over the top with Unna boot wraps. She arrives in clinic today with a lot less ominous looking drainage although she is only use this topical preparation once the second time today. She sees Dr. [name] her surgeon on Friday she has home health changing the dressing 11/2; still using her compounded topical antibiotic under silver alginate. Surface is cleaning up there is less drainage. We had an Apligraf for her today and I elected to apply it. A light coating of her antibiotic 04/25/2020 upon evaluation today patient appears to be doing well with regard to her ankle ulcer. There is a little bit of slough noted on the surface of the wound I am can have to perform sharp debridement to clear this away today. With that being said other than that fact overall I feel like she is making progress and we do see some new epithelial growth. There is also some improvement in the depth of the wound and that distal portion. There is little bit of slough there as well. 12/7; 2-week follow-up. Apligraf #3. Dimensions are smaller. Closing in especially inferiorly. Still some surface debris. Still using the [facility] topical antibiotic but I told her that I don't think this needs to be renewed 12/21; 2-week follow-up. Apligraf #4 dimensions are smaller. Nice improvement 06/05/2020; 2-week follow-up. The patient's wound on the left medial ankle looks really excellent. Nice granulation. Advancing epithelialization no undermining no evidence of infection. We would have to reapply for another Apligraf but with the condition of this wound I did not feel strongly about it. We used Hydrofera Blue under the same degree of compression. She follows up with Dr. [name] her surgeon a week Friday 06/13/2020 upon evaluation today patient appears to be doing excellent in regard to her wound. She has been  tolerating the dressing changes without complication. Fortunately there is no signs of active infection at this time. No fevers, chills, nausea, vomiting, or diarrhea. She was using Hydrofera Blue last week. 06/20/2020 06/20/2020 on evaluation today patient actually appears to be doing excellent in regard to her wound. This is measuring better and looking much better as well. She has been using the collagen that seems to be doing better for her as well even though the [facility] was and is not sticking or feeling as rough on her wound. She did see Dr. [name] on Friday he is very pleased he also stated none of the hardware has shifted. That is great news 1/27; the patient has a small clean wound all that is remaining. I agree that this is too small to really consider an Apligraf. Under illumination the surface is looking quite good. We have been using collagen although the dimensions are not any better this week 2/2; the patient has a small clean wound on the left medial ankle. Although this left of her substantial original areas. Measurements are smaller. We have been using polymen Ag under an Haematologist. 2/10; small area on the left medial ankle. This looks clean nothing to debride however  dimensions are about the same we have been using polymen I think now for 2 weeks 2/17; not much change in surface area. We have been using polymen Ag without any improvement. 3/17; 1 month follow-up. The patient has been using endoform without any improvement in fact I think this looks worse with more depth and more expansion 3/24; no improvement. Perhaps less debris on the surface. We have been using Sorbact for 1 week 4/4; wound measures larger. She has edema in her leg and her foot which she tells as her wrap came down. We have been using Unna boots. Sorbact of the wound. She has been approved for Apligraf 09/12/2020 upon evaluation today patient appears to be doing well with regard to her wound. We did get the  Apligraf reapproved which is great news we have that available for application today. Fortunately there is no signs of infection and overall the patient seems to be doing great. The wound bed is nice and clean. 4/27; patient presents for her second application of Apligraf. She states over the past week she has been on her feet more often due to being outside in her garden. She has noted more swelling to her foot as a result. She denies increased warmth, pain or erythema to the wound site. 10/10/2020 upon evaluation today patient appears to be doing well with regard to her wound which does not appear to be quite as irritated as last week from what I am hearing. With that being said unfortunately she is having issues with some erythema and warmth to touch as well as an increase size. I do believe this likely is infected. 10/17/2020 upon evaluation today patient appears to be doing excellent in regard to her wound this is significantly improved as compared to last week. Fortunately I think that the infection is much better controlled at this point. She did have evidence of both Enterococcus as well as Staphylococcus noted on culture. Enterococcus really would not be helped significantly by the Cipro but the wound is doing so much better I am under the assumption that the Staphylococcus is probably the main organism that is causing the current infection. Nonetheless I think that she is doing excellent as far as that is concerned and I am very pleased in that regard. I would therefore recommend she continue with the Cipro. 10/31/2020 upon evaluation today patient appears to be doing well with regard to her wound. She has been tolerating the dressing changes without complication. Fortunately there is no signs of active infection and overall I am extremely pleased with where things stand today. No fevers, chills, nausea, vomiting, or diarrhea. With that being said she does have some green drainage coming from the  wound and although it looks okay I am a little concerned about the possibility of a continuing infection. Specifically with Pseudomonas. For that reason I will go ahead and send in a prescription for Cipro for her to be continued. 11/14/2020 upon evaluation today patient appears to be doing very well currently in regard to her wound on her leg. She has been tolerating the dressing changes without complication. Fortunately I feel like the infection is finally under good control here. Unfortunately we do not have the Apligraf for application today although we can definitely order to have it in place for next week. That will be her fifth and final of the current series. Nonetheless I feel like her wound is really doing quite well which is great news. 11/21/2020 upon evaluation today patient appears  to be doing well with regard to her wound on the medial ankle. Fortunately I think the infection is under control and I do believe we can go ahead and reapply the Apligraf today. She is in agreement with that plan. There does not appear to be any signs of active infection at this time which is great news. No fevers, chills, nausea, vomiting, or diarrhea. 12/05/2020 upon evaluation today patient's wound bed actually showed signs of good granulation epithelization at this point. There does not appear to be any signs of infection which is great news and overall very pleased with where things stand. Overall the patient seems to be doing fairly well in my opinion with regard to her wound although I do believe she continues to build up a lot of biofilm I think she could benefit from using PuraPly at this point. 12/12/2020 upon evaluation today patient's wound actually appears to be doing decently well today. The Unna boot has not been quite as well-tolerated so that more uncomfortable for her and even causing some pressure over the plate on the lateral portion of her foot which is 90 where the wound is. There did  not appear to be any significant deep tissue injury with that there may be a minimal change in the skin noted I think that we may want to go back to the Coflex 2 layer which is a little bit easier on her skin it seems. 12/19/2020 upon evaluation today patient actually seems to be making great progress with the PuraPly currently. She in fact seems to be much better as far as the overall appearance of the wound bed is concerned I am very happy in this regard. I do not see any signs of of infection which is great news as well. No fevers, chills, nausea, vomiting, or diarrhea. Objective Constitutional Well-nourished and well-hydrated in no acute distress. Vitals Time Taken: 10:14 AM, Height: 68 in, Temperature: 98.4 F, Pulse: 61 bpm, Respiratory Rate: 16 breaths/min, Blood Pressure: 113/66 mmHg. Respiratory normal breathing without difficulty. Psychiatric this patient is able to make decisions and demonstrates good insight into disease process. Alert and Oriented x 3. pleasant and cooperative. General Notes: Upon inspection patient's wound bed actually showed signs of good granulation epithelization at this point. She did have some slough on the surface of the wound as well as biofilm I cleared this off without complication today and postdebridement wound bed appears to be doing significantly better which is great news. I do not see any evidence of active infection at this time which is great. It is also measuring a little bit smaller today which is also a plus. Integumentary (Hair, Skin) Wound #15 status is Open. Original cause of wound was Gradually Appeared. The date acquired was: 12/30/2019. The wound has been in treatment 48 weeks. The wound is located on the Left,Medial Malleolus. The wound measures 3.7cm length x 2.7cm width x 0.1cm depth; 7.846cm^2 area and 0.785cm^3 volume. There is Fat Layer (Subcutaneous Tissue) exposed. There is no tunneling or undermining noted. There is a medium amount  of serosanguineous drainage noted. The wound margin is distinct with the outline attached to the wound base. There is large (67-100%) red, pink granulation within the wound bed. There is a small (1-33%) amount of necrotic tissue within the wound bed including Adherent Slough. 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 There was a Excisional Skin/Subcutaneous Tissue Debridement with a total area of 9.99 sq cm performed by [name], PA. With the following instrument(s): Curette to remove Viable and Non-Viable tissue/material. Material removed includes Subcutaneous Tissue and Slough and after achieving pain control using Lidocaine 5% topical ointment. No specimens were taken. A time out was conducted at 10:45, prior to the start of the procedure. A Minimum amount of bleeding was controlled with Pressure. The procedure was tolerated well with a pain level of 3 throughout and a pain level of 1 following the procedure. Post Debridement Measurements: 3.7cm length x 2.7cm width x 0.1cm depth; 0.785cm^3 volume. Character of Wound/Ulcer Post Debridement is improved. Severity of Tissue Post Debridement is: Fat layer exposed. Post procedure Diagnosis Wound #15: Same as Pre-Procedure Pre-procedure diagnosis of Wound #15 is a Venous Leg Ulcer located on the Left,Medial Malleolus. A skin graft procedure using a bioengineered skin substitute/cellular or tissue based product was performed by [name], PA with the following instrument(s): Forceps and Scissors. Puraply AM was applied and  secured with Steri-Strips. 12 sq cm of product was utilized and 0 sq cm was wasted. Post Application, adaptic, ABD was applied. A Time Out was conducted at 10:50, prior to the start of the procedure. The procedure was tolerated well with a pain level of 0 throughout and a pain level of 1 following the procedure. Post procedure Diagnosis Wound #15: Same as Pre-Procedure . Pre-procedure diagnosis of Wound #15 is a Venous Leg Ulcer located on the Left,Medial Malleolus . There was a Double Layer Compression Therapy Procedure by Deon Pilling, RN. Post procedure Diagnosis Wound #15: Same as Pre-Procedure Plan Follow-up Appointments: Return Appointment in 1 week. - with Margarita Grizzle - Puraply Cellular or Tissue Based Products: Cellular or Tissue Based Product Type: - PuraPly: #2 Bathing/ Shower/ Hygiene: May shower with protection but do not get wound dressing(s) wet. Edema Control - Lymphedema / SCD / Other: Elevate legs to the level of the heart or above for 30 minutes daily and/or when sitting, a frequency of: - throughout the day Avoid standing for long periods of time. Exercise regularly Compression stocking or Garment 20-30 mm/Hg pressure to: - right leg daily Additional Orders / Instructions: Follow Nutritious Diet WOUND #15: - Malleolus Wound Laterality: Left, Medial Cleanser: Soap and Water 1 x Per Week/30 Days Discharge Instructions: May shower and wash wound with dial antibacterial soap and water prior to dressing change. Peri-Wound Care: Triamcinolone 15 (g) 1 x Per Week/30 Days Discharge Instructions: Use triamcinolone 15 (g) mixed with lotion Peri-Wound Care: Sween Lotion (Moisturizing lotion) 1 x Per Week/30 Days Discharge Instructions: Apply moisturizing lotion as directed Secondary Dressing: Woven Gauze Sponge, Non-Sterile 4x4 in 1 x Per Week/30 Days Discharge Instructions: Apply over primary dressing as directed. Secondary Dressing: ABD Pad, 5x9 1 x Per Week/30 Days Discharge  Instructions: Apply over primary dressing and lateral ankle Secondary Dressing: ADAPTIC TOUCH 3x4.25 in 1 x Per Week/30 Days Discharge Instructions: Apply over skin sub Com pression Wrap: CoFlex TLC XL 2-layer Compression System 4x7 (in/yd) 1 x Per Week/30 Days Discharge Instructions: Apply CoFlex 2-layer compression as directed. (alt for 4 layer) 1. Would recommend currently that we going continue with the wound care measures as before and the patient is in agreement with plan  that includes the use of the PuraPly which was applied today and Adaptic was put over top of this. 2. I am also can recommend that this stay in place even if Dr. [name] does need to take the wrap off he can still leave the PuraPly in place and then the patient can reapply dressing at home until she can get back into see [place]. For now [place] go ahead and put the compression wrap on her however. 3. I am also can recommend she continue to elevate her leg especially if they have to take the wrap off that I think using an Ace wrap or something like that over top and elevating will be the way to go until she gets back next week to [place]. We will see patient back for reevaluation in 1 week here in the clinic. If anything worsens or changes patient will contact our office for additional recommendations. Electronic Signature(s) Signed: 12/19/2020 11:00:52 AM By: [name] PA-C Entered By: [name] on 12/19/2020 11:00:51 -------------------------------------------------------------------------------- SuperBill Details Patient Name: Date of Service: [name], [name] 12/19/2020 Medical Record Number: [id number] Patient Account Number: [id number] Date of Birth/Sex: Treating RN: 04-29-1956 (65 y.o. [name] Provider: [name] Other Clinician: Referring Provider: Treating Provider/Extender: [name] in Treatment: 48 Diagnosis Coding ICD-10 Codes Code  Description [phone] Chronic venous hypertension (idiopathic) with ulcer and inflammation of left lower extremity L97.828 Non-pressure chronic ulcer of other part of left lower leg with other specified severity L97.328 Non-pressure chronic ulcer of left ankle with other specified severity Facility Procedures CPT4 Code: [id number] Description: [id number] PuraPly AM 3X4 (12sq. cm) Modifier: Quantity: 12 CPT4 Code: [id number] Description: [id number] - SKIN SUB GRAFT TRNK/ARM/LEG ICD-10 Diagnosis Description L97.828 Non-pressure chronic ulcer of other part of left lower leg with other specified s Modifier: everity Quantity: 1 Physician Procedures : CPT4 Code Description Modifier [id number] - WC PHYS SKIN SUB GRAFT TRNK/ARM/LEG ICD-10 Diagnosis Description L97.828 Non-pressure chronic ulcer of other part of left lower leg with other specified severity Quantity: 1 Electronic Signature(s) Signed: 12/19/2020 11:01:00 AM By: [name] PA-C Entered By: [name] on 12/19/2020 11:00:59

## 2020-12-20 NOTE — Progress Notes (Signed)
[name], [name] ([id number]) Visit Report for 12/19/2020 Arrival Information Details Patient Name: Date of Service: [facility], [name] 12/19/2020 10:00 A M Medical Record Number: [id number] Patient Account Number: [id number] Date of Birth/Sex: Treating RN: 1956/01/07 (65 y.o. [name] Primary Care Wells Gerdeman: [name] Other Clinician: Referring [name]: Treating [name]: [name] in Treatment: 76 Visit Information History Since Last Visit Added or deleted any medications: No Patient Arrived: [name] Any new allergies or adverse reactions: No Arrival Time: 10:13 Had a fall or experienced change in No Transfer Assistance: None activities of daily living that may affect Patient Identification Verified: Yes risk of falls: Secondary Verification Process Completed: Yes Signs or symptoms of abuse/neglect since last visito No Patient Requires Transmission-Based Precautions: No Hospitalized since last visit: No Patient Has Alerts: Yes Implantable device outside of the clinic excluding No Patient Alerts: L ABI =1.12, TBI = .92 cellular tissue based products placed in the center R ABI= 1.02, TBI= .58 since last visit: Has Dressing in Place as Prescribed: Yes Has Compression in Place as Prescribed: Yes Pain Present Now: No Electronic Signature(s) Signed: 12/19/2020 5:31:35 PM By: [name] Entered By: [name] on 12/19/2020 10:13:54 -------------------------------------------------------------------------------- Compression Therapy Details Patient Name: Date of Service: [name] [name], [name] 12/19/2020 10:00 A M Medical Record Number: [id number] Patient Account Number: [id number] Date of Birth/Sex: Treating RN: 09-20-1955 (65 y.o. [name] Primary Care Vicent Febles: [name] Other Clinician: Referring Mykela Mewborn: Treating [name]/Extender: [name] in Treatment: 48 Compression Therapy  Performed for Wound Assessment: Wound #15 Left,Medial Malleolus Performed By: Clinician [name] Pilling, RN Compression Type: Double Layer Post Procedure Diagnosis Same as Pre-procedure Electronic Signature(s) Signed: 12/19/2020 6:23:27 PM By: [name] Gouty RN, BSN Entered By: [name] on 12/19/2020 10:46:25 -------------------------------------------------------------------------------- Encounter Discharge Information Details Patient Name: Date of Service: [name] [name], [name] 12/19/2020 10:00 A M Medical Record Number: [id number] Patient Account Number: [id number] Date of Birth/Sex: Treating RN: 1955-10-15 (65 y.o. [name] Primary Care Niharika Savino: [name] Other Clinician: Referring Eleny Cortez: Treating Jeremie Giangrande/Extender: [name] in Treatment: 47 Encounter Discharge Information Items Post Procedure Vitals Discharge Condition: Stable Temperature (F): 98.4 Ambulatory Status: Cane Pulse (bpm): 61 Discharge Destination: Home Respiratory Rate (breaths/min): 16 Transportation: Private Auto Blood Pressure (mmHg): 113/66 Accompanied By: self Schedule Follow-up Appointment: Yes Clinical Summary of Care: Electronic Signature(s) Signed: 12/19/2020 11:30:48 AM By: [name] Entered By: [name] on 12/19/2020 11:30:34 -------------------------------------------------------------------------------- Lower Extremity Assessment Details Patient Name: Date of Service: [name], [name] 12/19/2020 10:00 A M Medical Record Number: [id number] Patient Account Number: [id number] Date of Birth/Sex: Treating RN: 1956/03/30 (65 y.o. [name] Primary Care Heron Pitcock: [name] Other Clinician: Referring Zakkery Dorian: Treating Tenae Graziosi/Extender: [name] Weeks in Treatment: 48 Edema Assessment Assessed: [Left: Yes] [Right: No] Edema: [Left: Ye] [Right: s] Calf Left: Right: Point of Measurement: 31 cm From  Medial Instep 26 cm Ankle Left: Right: Point of Measurement: 11 cm From Medial Instep 21 cm Vascular Assessment Pulses: Dorsalis Pedis Palpable: [Left:Yes] Electronic Signature(s) Signed: 12/19/2020 5:31:35 PM By: [name] Entered By: [name] on 12/19/2020 10:26:08 -------------------------------------------------------------------------------- Multi-Disciplinary Care Plan Details Patient Name: Date of Service: [name] [name], [name] 12/19/2020 10:00 A M Medical Record Number: [id number] Patient Account Number: [id number] Date of Birth/Sex: Treating RN: [birth year] (65 y.o. [name] Primary Care Nevaeh Korte: [name] Other Clinician: Referring Barbaraann Avans:  Treating Barnett Elzey/Extender: [name] Weeks in Treatment: 48 Multidisciplinary Care Plan reviewed with physician Active Inactive Venous Leg Ulcer Nursing Diagnoses: Actual venous Insuffiency (use after diagnosis is confirmed) Knowledge deficit related to disease process and management Goals: Patient will maintain optimal edema control Date Initiated: 06/20/2020 Target Resolution Date: 01/02/2021 Goal Status: Active Interventions: Assess peripheral edema status every visit. Compression as ordered Treatment Activities: Therapeutic compression applied : 06/20/2020 Notes: Wound/Skin Impairment Nursing Diagnoses: Impaired tissue integrity Knowledge deficit related to ulceration/compromised skin integrity Goals: Patient/caregiver will verbalize understanding of skin care regimen Date Initiated: 01/17/2020 Target Resolution Date: 01/02/2021 Goal Status: Active Ulcer/skin breakdown will have a volume reduction of 30% by week 4 Date Initiated: 01/17/2020 Date Inactivated: 03/12/2020 Target Resolution Date: 03/09/2020 Goal Status: Met Interventions: Assess patient/caregiver ability to obtain necessary supplies Assess patient/caregiver ability to perform ulcer/skin care regimen upon  admission and as needed Assess ulceration(s) every visit Provide education on ulcer and skin care Notes: Electronic Signature(s) Signed: 12/19/2020 6:23:27 PM By: [name] Gouty RN, BSN Entered By: [name] on 12/19/2020 10:45:16 -------------------------------------------------------------------------------- Pain Assessment Details Patient Name: Date of Service: [name], [name] 12/19/2020 10:00 A M Medical Record Number: [id number] Patient Account Number: [id number] Date of Birth/Sex: Treating RN: Feb 14, 1956 (65 y.o. [name] Primary Care Hayward Rylander: [name] Other Clinician: Referring Alyria Krack: Treating Sindia Kowalczyk/Extender: [name] in Treatment: 59 Active Problems Location of Pain Severity and Description of Pain Patient Has Paino No Site Locations Pain Management and Medication Current Pain Management: Electronic Signature(s) Signed: 12/19/2020 5:31:35 PM By: [name] Entered By: [name] on 12/19/2020 10:21:15 -------------------------------------------------------------------------------- Patient/Caregiver Education Details Patient Name: Date of Service: [name] [name], [name] 7/20/2022andnbsp10:00 A M Medical Record Number: [id number] Patient Account Number: [id number] Date of Birth/Gender: Treating RN: 01-Jan-1956 (65 y.o. [name] Primary Care Physician: [name] Other Clinician: Referring Physician: Treating Physician/Extender: [name] in Treatment: 61 Education Assessment Education Provided To: Patient Education Topics Provided Venous: Methods: Explain/Verbal Responses: Reinforcements needed, State content correctly Wound Debridement: Methods: Explain/Verbal Responses: Reinforcements needed, State content correctly Electronic Signature(s) Signed: 12/19/2020 6:23:27 PM By: [name] Gouty RN, BSN Signed: 12/19/2020 6:23:27 PM By: [name] Gouty RN,  BSN Entered By: [name] on 12/19/2020 10:45:48 -------------------------------------------------------------------------------- Wound Assessment Details Patient Name: Date of Service: [name] [name], [name] 12/19/2020 10:00 A M Medical Record Number: [id number] Patient Account Number: [id number] Date of Birth/Sex: Treating RN: [birth year] (65 y.o. [name] Primary Care Raydon Chappuis: [name] Other Clinician: Referring Kerry-Anne Mezo: Treating Siarra Gilkerson/Extender: [name] in Treatment: 48 Wound Status Wound Number: 15 Primary Venous Leg Ulcer Etiology: Wound Location: Left, Medial Malleolus Wound Status: Open Wounding Event: Gradually Appeared Comorbid Peripheral Venous Disease, Osteoarthritis, Confinement Date Acquired: 12/30/2019 History: Anxiety Weeks Of Treatment: 48 Clustered Wound: No Wound Measurements Length: (cm) 3.7 Width: (cm) 2.7 Depth: (cm) 0.1 Area: (cm) 7.846 Volume: (cm) 0.785 % Reduction in Area: 32.7% % Reduction in Volume: 91.6% Epithelialization: Small (1-33%) Tunneling: No Undermining: No Wound Description Classification: Full Thickness With Exposed Support Structures Wound Margin: Distinct, outline attached Exudate Amount: Medium Exudate Type: Serosanguineous Exudate Color: red, brown Foul [name] After Cleansing: No Slough/Fibrino Yes Wound Bed Granulation Amount: Large (67-100%) Exposed Structure Granulation Quality: Red, Pink Fascia Exposed: No Necrotic Amount: Small (1-33%) Fat Layer (Subcutaneous Tissue) Exposed: Yes Necrotic Quality: Adherent Slough Tendon Exposed: No Muscle Exposed: No Joint Exposed: No Bone Exposed: No Treatment  Notes Wound #15 (Malleolus) Wound Laterality: Left, Medial Cleanser Soap and Water Discharge Instruction: May shower and wash wound with dial antibacterial soap and water prior to dressing change. Peri-Wound Care Triamcinolone 15 (g) Discharge Instruction: Use  triamcinolone 15 (g) mixed with lotion Sween Lotion (Moisturizing lotion) Discharge Instruction: Apply moisturizing lotion as directed Topical Primary Dressing Secondary Dressing Woven Gauze Sponge, Non-Sterile 4x4 in Discharge Instruction: Apply over primary dressing as directed. ABD Pad, 5x9 Discharge Instruction: Apply over primary dressing and lateral ankle ADAPTIC TOUCH 3x4.25 in Discharge Instruction: Apply over skin sub Secured With Compression Wrap CoFlex TLC XL 2-layer Compression System 4x7 (in/yd) Discharge Instruction: Apply CoFlex 2-layer compression as directed. (alt for 4 layer) Compression Stockings Add-Ons Electronic Signature(s) Signed: 12/19/2020 5:31:35 PM By: [name] Entered By: [name] on 12/19/2020 10:21:52 -------------------------------------------------------------------------------- Vitals Details Patient Name: Date of Service: [name] [name], [name] 12/19/2020 10:00 A M Medical Record Number: [id number] Patient Account Number: [id number] Date of Birth/Sex: Treating RN: January 21, 1956 (65 y.o. [name] Primary Care Ayeden Gladman: [name] Other Clinician: Referring Duffy Dantonio: Treating Avani Sensabaugh/Extender: [name] in Treatment: 48 Vital Signs Time Taken: 10:14 Temperature (F): 98.4 Height (in): 68 Pulse (bpm): 61 Respiratory Rate (breaths/min): 16 Blood Pressure (mmHg): 113/66 Reference Range: 80 - 120 mg / dl Electronic Signature(s) Signed: 12/19/2020 5:31:35 PM By: [name] Entered By: [name] on 12/19/2020 10:16:43

## 2020-12-21 ENCOUNTER — Ambulatory Visit (INDEPENDENT_AMBULATORY_CARE_PROVIDER_SITE_OTHER): Payer: Medicare Other | Admitting: Podiatry

## 2020-12-21 ENCOUNTER — Ambulatory Visit (INDEPENDENT_AMBULATORY_CARE_PROVIDER_SITE_OTHER): Payer: Medicare Other

## 2020-12-21 ENCOUNTER — Other Ambulatory Visit: Payer: Self-pay

## 2020-12-21 DIAGNOSIS — M216X2 Other acquired deformities of left foot: Secondary | ICD-10-CM | POA: Diagnosis not present

## 2020-12-21 DIAGNOSIS — Z9889 Other specified postprocedural states: Secondary | ICD-10-CM

## 2020-12-21 MED ORDER — GABAPENTIN 100 MG PO CAPS: 100.0000 mg | ORAL_CAPSULE | Freq: Three times a day (TID) | ORAL | 1 refills | Status: DC

## 2020-12-21 NOTE — Progress Notes (Signed)
   Subjective:  Patient presents today status post tibiotalar calcaneal arthrodesis left lower extremity. DOS: 12/15/2019.  Patient continues to be managed at the wound care center for the ulcers that have developed to the ankle.  She is concerned that her foot continues to turn and twist.  She would like to have it evaluated.  She is concerned for possible movement of the hardware.  She presents for further treatment and evaluation  Past Medical History:  Diagnosis Date   Allergy    Ankle wound 02/2016   bilateral   Dental bridge present    lower   Dental crowns present    upper right   Open wounds involving multiple regions of lower extremity    Peripheral vascular disease ([place])    venous stasis ulcers bilateral lower extremities   PONV (postoperative nausea and vomiting)    Objective/Physical Exam Neurovascular status intact.  The rearfoot is in a rectus position with good alignment of the foot in relationship to the leg.  From the midtarsal joint distal, there is some forefoot adductus deformity.  The foot is in an almost 90 degree angle.  There is some palpable orthopedic hardware noted to the lateral aspect of the heel bone.  Mild sensitivity to the area.  No open wounds noted.  Radiographic exam: Orthopedic hardware appears intact and stable.  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  Patient has now over 3 months postoperative and radiographically appears stable and healing well.  No significant change since last x-rays  Assessment: 1. s/p tibiotalar calcaneal arthrodesis left. DOS: 12/15/2019 2.  Postsurgical edema left-mostly resolved 3.  Ulcer right medial ankle secondary to venous insufficiency 4.  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 Patient was evaluated.  X-rays were reviewed today and compared to prior x-rays taken.  No significant change 2.  Reassured the patient that the orthopedic hardware is  intact and unchanged since last visit. 3.  Continue management at the wound care center for the ulcer of the ankle 4.  Recommend good supportive shoes that stabilize the foot  5.  Return to clinic in 3 months  Edrick Kins, DPM [facility]  Dr. Edrick Kins, DPM    [address], [place] [address]                Office [phone]  Fax [phone]

## 2020-12-26 ENCOUNTER — Encounter (HOSPITAL_BASED_OUTPATIENT_CLINIC_OR_DEPARTMENT_OTHER): Payer: Medicare Other | Admitting: Physician Assistant

## 2020-12-26 ENCOUNTER — Other Ambulatory Visit: Payer: Self-pay

## 2020-12-26 DIAGNOSIS — I87332 Chronic venous hypertension (idiopathic) with ulcer and inflammation of left lower extremity: Secondary | ICD-10-CM | POA: Diagnosis not present

## 2020-12-26 NOTE — Progress Notes (Signed)
[name], [name] ([id number]) Visit Report for 12/26/2020 Chief Complaint Document Details Patient Name: Date of Service: [facility] [name], [name] 12/26/2020 10:45 A M Medical Record Number: [id number] Patient Account Number: [id number] Date of Birth/Sex: Treating RN: [birth year] (65 y.o. [name]: [name] Other Clinician: Referring [name]: Treating [name]/Extender: [name]: 33 Information Obtained from: Patient Chief Complaint patient is been followed long-term in this clinic for venous insufficiency ulcers with inflammation, hypertension and ulceration over the medial ankle bilaterally. 01/17/2020; this is a patient who is here for review of postoperative wounds on the left lateral ankle and recurrence of venous stasis ulceration on the left medial Electronic Signature(s) Signed: 12/26/2020 11:53:19 AM By: [name] Keeler PA-C Entered By: [name] on 12/26/2020 11:53:19 -------------------------------------------------------------------------------- Cellular or Tissue Based Product Details Patient Name: Date of Service: [facility] [name], [name] 12/26/2020 10:45 A M Medical Record Number: [id number] Patient Account Number: [id number] Date of Birth/Sex: Treating RN: 13-Aug-1955 (65 y.o. [name]: [name] Other Clinician: Referring [name]: Treating [name]/Extender: [name]: 39 Cellular or Tissue Based Product Type Wound #15 Left,Medial Malleolus Applied to: Performed By: Physician [name] Keeler, PA Cellular or Tissue Based Product Type: Puraply AM Level of Consciousness (Pre-procedure): Awake and Alert Pre-procedure Verification/Time Out Yes - 11:40 Taken: Location: trunk / arms / legs Wound Size (sq cm): 8.84 Product Size (sq cm): 12 Waste Size (sq cm): 0 Amount of Product Applied (sq cm): 12 Instrument Used:  Forceps, Scissors Lot #: [id number] Order #: [id number] Expiration Date: 04/11/2023 Fenestrated: No Reconstituted: Yes Solution Type: saline Solution Amount: 2 ml Lot #: [id number] Solution Expiration Date: 01/31/2022 Secured: No Dressing Applied: Yes Primary Dressing: adaptic, gauze Procedural Pain: 0 Post Procedural Pain: 0 Response to [name]: Procedure was tolerated well Level of Consciousness (Post- Awake and Alert procedure): Post Procedure Diagnosis Same as Pre-procedure Electronic Signature(s) Signed: 12/26/2020 4:30:28 PM By: [name] Keeler PA-C Signed: 12/26/2020 5:32:13 PM By: [name] Gouty RN, BSN Entered By: [name] on 12/26/2020 11:44:39 -------------------------------------------------------------------------------- HPI Details Patient Name: Date of Service: [name] [name], [name] 12/26/2020 10:45 A M Medical Record Number: [id number] Patient Account Number: [id number] Date of Birth/Sex: Treating RN: 1956/01/09 (65 y.o. [name]: [name] Other Clinician: Referring [name]: Treating [name]/Extender: [name]: 68 History of Present Illness HPI Description: the remaining wound is over the left medial ankle. Similar wound over the right medial ankle healed largely with use of Apligraf. Most recently we have been using Hydrofera Blue over this wound with considerable improvement. The patient has been extensively worked up in the past for her venous insufficiency and she is not a candidate for antireflux surgery although I have none of the details available currently. 08/24/14; considerable improvement today. About 50% of this wound areas now epithelialized. The base of the wound appears to be healthier granulation.as opposed to last week when she had deteriorated a considerable improvement 08/17/14; unfortunately the wound has regressed somewhat. The areas of epithelialization from the  superior aspect are not nearly as healthy as they were last week. The patient thinks her Hydrofera Blue slipped. 09/07/14; unfortunately the area has markedly regressed in the 2 weeks since I've seen this. There is an [name] surrounding erythema. The healthy granulation tissue that we had at the base of the wound now is  a dusky color. The nurse reports green drainage 09/14/14; the area looks somewhat better than last week. There is less erythema and less drainage. The culture I did did not show any growth. Nevertheless I think it is better to continue the Cipro and doxycycline for a further week. The remaining wound area was debridement. 09/21/14. Wound did not require debridement last week. Still less erythema and less drainage. She can complete her antibiotics. The areas of epithelialization in the superior aspect of the wound do not look as healthy as they did some weeks ago 10/05/14 continued improvement in the condition of this wound. There is advancing epithelialization. Less aggressive debridement required 10/19/14 continued improvement in the condition and volume of this wound. Less aggressive debridement to the inferior part of this to remove surface slough and fibrinous eschar 11/02/14 no debridement is required. The surface granulation appears healthy although some of her islands of epithelialization seem to have regressed. No evidence of infection 11/16/14; lites surface debridement done of surface eschar. The wound does not look to be unhealthy. No evidence of infection. Unfortunately the patient has had podiatry issues in the right foot and for some reason has redeveloped small surface ulcerations in the medial right ankle. Her original presentation involved wounds in this area 11/23/14 no debridement. The area on the right ankle has enlarged. The left ankle wound appears stable in terms of the surface although there is periwound inflammation. There has been regression in the amount of new  skin 11/30/14 no debridement. Both wound areas appear healthy. There was no evidence of infection. The the new area on the right medial ankle has enlarged although that both the surfaces appear to be stable. 12/07/14; Debridement of the right medial ankle wound. No no debridement was done on the left. 12/14/14 no major change in and now bilateral medial ankle wounds. Both of these are very painful but the no overt evidence of infection. She has had previous venous ablation 12/21/14; patient states that her right medial ankle wound is considerably more painful last week than usual. Her left is also somewhat painful. She could not tolerate debridement. The right medial ankle wound has fibrinous surface eschar 12/28/14 this is a patient with severe bilateral venous insufficiency ulcers. For a considerable period of time we actually had the one on the right medial ankle healed however this recently opened up again in June. The left medial ankle wound has been a refractory area with some absent flows. We had some success with Hydrofera Blue on this area and it literally closed by 50% however it is recently opened up Foley. Both of these were debridement today of surface eschar. She tolerates this poorly 01/25/15: No change in the status of this. Thick adherent escar. Very poor tolerance of any attempt at debridement. I had healed the right medial malleolus wound for a considerable amount of time and had the left one down to about 50% of the volume although this is totally regressed over the last 48 weeks. Further the right leg has reopened. she is trying to make a appointment with pain and vascular, previous ablations with Dr. Aleda Grana. I do not believe there is an arterial insufficiency issue here 02/01/15 the status of the adherent eschar bilaterally is actually improved. No debridement was done. She did not manage to get vascular studies done 02/08/15 continued debridement of the area was done today. The  slough is less adherent and comes off with less pressure. There is no surrounding infection peripheral pulses are  intact 02/15/15 selective debridement with a disposable curette. Again the slough is less adherent and comes off with less difficulty. No surrounding infection peripheral pulses are intact. 02/22/15 selective debridement of the right medial ankle wound. Slough comes off with less difficulty. No obvious surrounding infection peripheral pulses are intact I did not debridement the one on the left. Both of these are stable to improved 03/01/15 selective debridement of both wound areas using a curette to. Adherent slough cup soft with less difficulty. No obvious surrounding infection. The patient tells me that 2 days ago she noted a rash above the right leg wrap. She did not have this on her lower legs when she change this over she arrives with widespread left greater than right almost folliculitis-looking rash which is extremely pruritic. I don't see anything to culture here. There is no rash on the rest of her body. She feels well systemically. 03/08/15; selective debridement of both wounds using a curette. Base of this does not look unhealthy. She had limegreen drainage coming out of the left leg wound and describes a lot of drainage. The rash on her left leg looks improved to. No cultures were done. 03/22/15; patient was not here last week. Basal wounds does not look healthy and there is no surrounding erythema. No drainage. There is still a rash on the left leg that almost looks vasculitic however it is clearly limited to the top of where the wrap would be. 04/05/15; on the right required a surgical debridement of surface eschar and necrotic subcutaneous tissue. I did not debridement the area on the left. These continue to be large open wounds that are not changing that much. We were successful at one point in healing the area on the right, and at the same time the area on the left was roughly  half the size of current measurements. I think a lot of the deterioration has to do with the prolonged time the patient is on her feet at work 04/19/15 I attempted-like surface debridement bilaterally she does not tolerate this. She tells me that she was in allergic care yesterday with extreme pain over her left lateral malleolus/ankle and was told that she has an "sprain" 05/03/15; large bilateral venous insufficiency wounds over the medial malleolus/medial aspect of her ankles. She complains of copious amounts of drainage and his usual large amounts of pain. There is some increasing erythema around the wound on the right extending into the medial aspect of her foot to. historically she came in with these wounds the right one healed and the left one came down to roughly half its current size however the right one is reopened and the left is expanded. 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 05/10/15 large bilateral venous insufficiency wounds. There is less adherence surface left however the surface culture that I did last week grew pseudomonas therefore bilateral selective debridement score necessary. There is surrounding erythema. The patient describes severe bilateral drainage and a lot of pain in the left ankle. Apparently her podiatrist was were ready to do a cortisone shot 05/17/15; the patient complains of pain and again copious amounts of drainage. 05/24/15; we used Iodo flex last week. Patient notes considerable improvement in wound drainage. Only needed to change this once. 05/31/15; we continued Iodoflex; the base of these large wounds bilaterally is not too bad but there is probably likely a significant bioburden here. I would like to debridement  just doesn't tolerate it. 06/06/14 I would like to continue the Iodoflex although she still hasn't managed to obtain supplies. She has bilateral medial malleoli or large wounds which are mostly  superficial. Both of them are covered circumferentially with some nonviable fibrinous slough although she tolerates debridement very poorly. She apparently has an appointment for an ablation on the right leg by interventional radiology. 06/14/15; the patient arrives with the wounds and static condition. We attempted a debridement although she does not do well with this secondary to pain. I 07/05/15; wounds are not much smaller however there appears to be a cleaner granulating base. The left has tight fibrinous slough greater than the right. Debridement is tolerated poorly due to pain. Iodoflex is done more for these wounds in any of the multitude of different dressings I have tried on the left 1 and then subsequently the right. 07/12/15; no change in the condition of this wound. I am able to do an aggressive debridement on the right but not the left. She simply cannot tolerate it. We have been using Iodoflex which helps somewhat. It is worthwhile remembering that at one point we healed the right medial ankle wound and the left was about 25% of the current circumference. We have suggested returning to vascular surgery for review of possible further ablations for one reason or another she has not been able to do this. 07/26/15 no major change in the condition of either wound on her medial ankle. I did not attempt to debridement of these. She has been aggressively scrubbing these while she is in the shower at home. She has her supply of Iodoflex which seems to have done more for these wounds then anything I have put on recently. 08/09/15 wound area appears larger although not verified by measurements. Using Iodoflex 09/05/2015 -- she was here for avisit today but had significant problems with the wound and I was asked to see her for a physician opinion. I have summarize that this lady has had surgery on her left lower extremity about 10 years ago where the possible veins stripping was done. She has had  an opinion from interventional radiology around November 2016 where no further sclerotherapy was ordered. The patient works 12 hours a day and stands on a concrete floor with work boots and is unable to get the proper compression she requires and cannot elevate her limbs appropriately at any given time. She has recently grown Pseudomonas from her wound culture but has not started her ciprofloxacin which was called in for her. 09/13/15 this continues to be a difficult situation for this patient. At one point I had this wound down to a 1.5 x 1.5" wound on her left leg. This is deteriorated and the right leg has reopened. She now has substantial wounds on her medial calcaneus, malleoli and into her lower leg. One on the left has surface eschar but these are far too painful for me to debridement here. She has a vascular surgery appointment next week to see if anything can be done to help here. I think she has had previous ablations several years ago at [facility]. She has no major edema. She tells me that she did not get product last time [facility] Ag] and went for several days without it. She continues to work in work boots 12 hours a day. She cannot get compression/4-layer under her work boots. 09/20/15 no major change. Periwound edema control was not very good. Her point with pain and vascular is next Wednesday  the 25th 09/28/15; the patient is seen vascular surgery and is apparently scheduled for repeat duplex ultrasounds of her bilateral lower legs next week. 10/05/15; the patient was seen by Dr. Doren Custard of vascular surgery. He feels that she should have arterial insufficiency excluded as cause/contributed to her nonhealing stage she is therefore booked for an arteriogram. She has apparently monophasic signals in the dorsalis pedis pulses. She also of course has known severe chronic venous insufficiency with previous procedures as noted previously. I had another long discussion with the patient today  about her continuing to work 12 hour shifts. I've written her out for 2 months area had concerns about this as her work location is currently undergoing significant turmoil and this may lead to her termination. She is aware of this however I agree with her that she simply cannot continue to stand for 12 hours multiple days a week with the substantial wound areas she has. 10/19/15; the Dr. Doren Custard appointment was largely for an arteriogram which was normal. She does not have an arterial issue. He didn't make a comment about her chronic venous insufficiency for which she has had previous ablations. Presumably it was not felt that anything additional could be done. The patient is now out of work as I prescribed 2 weeks ago. Her wounds look somewhat less aggravated presumably because of this. I felt I would give debridement another try today 10/25/15; no major change in this patient's wounds. We are struggling to get her product that she can afford into her own home through her insurance. 11/01/15; no major change in the patient's wounds. I have been using silver alginate as the most affordable product. I spoke to Dr. Marla Roe last week with her requested take her to the OR for surgical debridement and placement of ACEL. Dr. Marla Roe told me that she would be willing to do this however [facility] will not cover this, fortunately the patient has [place] healthcare of some variant 11/08/15; no major change in the patient's wounds. She has been completely nonviable surface that this but is in too much pain with any attempted debridement are clinic. I have arranged for her to see Dr. Marla Roe ham of plastic surgery and this appointment is on Monday. I am hopeful that they will take her to the OR for debridement, possible ACEL ultimately possible skin graft 11/22/15 no major change in the patient's wounds over her bilateral medial calcaneus medial malleolus into the lower legs. Surface on these does not  look too bad however on the left there is surrounding erythema and tenderness. This may be cellulitis or could him sleepy tinea. 11/29/15; no major changes in the patient's wounds over her bilateral medial malleolus. There is no infection here and I don't think any additional antibiotics are necessary. There is now plan to move forward. She sees Dr. Marla Roe in a week's time for preparation for operative debridement and ACEL placement I believe on 7/12. She then has a follow-up appointment with Dr. Marla Roe on 7/21 12/28/15; the patient returns today having been taken to the [place] by Dr. Marla Roe 12/12/15 she underwent debridement, intraoperative cultures [which were negative]. She had placement of a wound VAC. Parent really ACEL was not available to be placed. The wound VAC foam apparently adhered to the wound since then she's been using silver alginate, Xeroform under Ace wraps. She still says there is a lot of drainage and a lot of pain 01/31/16; this is a patient I see monthly. I had referred her  to Dr. Marla Roe him of plastic surgery for large wounds on her bilateral medial ankles. She has been to the OR twice once in early July and once in early August. She tells me over the last 3 weeks she has been using the wound VAC with ACEL underneath it. On the right we've simply been using silver alginate. Under Kerlix Coban wraps. 02/28/16; this is a patient I'm currently seeing monthly. She is gone on to have a skin graft over her large venous insufficiency ulcer on the left medial ankle. This was done by Dr. Marla Roe him. The patient is a bit perturbed about why she didn't have one on her right medial ankle wound. She has been using silver alginate to this. 03/06/16; I received a phone call from her plastic surgery Dr. Marla Roe. She expressed some concern about the viability of the skin graft she did on the left medial ankle wound. Asked me to place Endoform on this. She told me she is not planning  to do a subsequent skin graft on the right as the left one did not take very well. I had placed Hydrofera Blue on the right 03/13/16; continue to have a reasonably healthy wound on the right medial ankle. Down to 3 mm in terms of size. There is epithelialization here. The area on the left medial ankle is her skin graft site. I suppose the last week this looks somewhat better. She has an open area inferiorly however in the center there appears to be some viable tissue. There is a lot of surface callus and eschar that will eventually need to come off however none of this looked to be infected. Patient states that the is able to keep the dressing on for several days which is an improvement. 03/20/16 no major change in the circumference of either wound however on the left side the patient was at Dr. Eusebio Friendly office and they did a debridement of left wound. 50% of the wound seems to be epithelialized. I been using Endoform on the left Hydrofera Blue in the right 03/27/16; she arrives today with her wound is not looking as healthy as they did last week. The area on the right clearly has an adherent surface to this a very similar surface on the left. Unfortunately for this patient this is all too familiar problem. Clearly the Endoform is not working and will need to change that today that has some potential to help this surface. She does not tolerate debridement in this clinic very well. She is changing the dressing wants 04/03/16; patient arrives with the wounds looking somewhat better especially on the right. Dr. Migdalia Dk change the dressing to silver alginate when she saw her on Monday and also sold her some compression socks. The usefulness of the latter is really not clear and woman with severely draining wounds. 04/10/16; the patient is doing a bit of an experiment wearing the compression stockings that Dr. Migdalia Dk provided her to her left leg and the out of legs based dressings that we provided to the  right. 05/01/16; the patient is continuing to wear compression stockings Dr. Migdalia Dk provided her on the left that are apparently silver impregnated. She has been using Iodoflex to the right leg wound. Still a moderate amount of drainage, when she leaves here the wraps only last for 4 days. She has to change the stocking on the left leg every night 05/15/16; she is now using compression stockings bilaterally provided by Dr. Marla Roe. She is wearing a nonadherent layer over  the wounds so really I don't think there is anything specific being done to this now. She has some reduction on the left wound. The right is stable. I think all healing here is being done without a specific dressing 06/09/16; patient arrives here today with not much change in the wound certainly in diameter to large circular wounds over the medial aspect of her ankle bilaterally. Under the light of these services are certainly not viable for healing. There is no evidence of surrounding infection. She is wearing compression stockings with some sort of silver impregnation as prescribed by Dr. Marla Roe. She has a follow-up with her tomorrow. 06/30/16; no major change in the size or condition of her wounds. These are still probably covered with a nonviable surface. She is using only her purchase stockings. She did see Dr. Marla Roe who seemed to want to apply Dakin's solution to this I'm not extreme short what value this would be. I would suggest Iodoflex which she still has at home. 07/28/16; I follow [name] episodically along with Dr. Marla Roe. 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 She has been applying a topical collagen ointment to both wounds with Adaptic. I don't think Dr. Marla Roe is planning to take her back to the OR. 08/19/16; I follow [name] on a monthly basis along with Dr. Marla Roe of plastic surgery. She has very refractory venous  insufficiency wounds on the bilateral medial lower legs left greater than right. I been following her for a number of years. At one point I was able to get the right medial malleolus wound to heal and had the left medial malleolus down to about half its current size however and I had to send her to plastic surgery for an operative debridement. Since then things have been stable to slightly improve the area on the right is slightly better one in the left about the same although there is much less adherent surface than I'm used to with this patient. She is using some form of liquid collagen gel that Dr. Marla Roe provided a Kerlix cover with the patient's own pressure stockings. She tells me that she has extreme pain in both ankles and along the lateral aspect of both feet. She has been unable to work for some period of time. She is telling me she is retiring at the beginning of April. She sees Dr. Doran Durand of orthopedics next week 09/22/16; patient has not seen Dr. Marla Roe since the last time she is here. I'm not really sure what she is using to the wounds other than bits and pieces of think she had left over including most recently Hydrofera Blue. She is using juxtalite stockings. She is having difficulty with her husband's recent illness "stroke". She is having to transport him to various doctors appointments. Dr. Marla Roe left her the option of a repeat debridement with ACEL however she has not been able to get the time to follow-up on this. She continues to have a fair amount of drainage out of these wounds with certainly precludes leaving dressings on all week 10/13/16; patient has not seen Dr. Marla Roe since she was last in our clinic. I'm not really sure what she is doing with the wounds, we did try to get her [facility] and I think she is actually using this most of the time. Because of drainage she states she has to change this every second day although this is an improvement from what  she used  to do. She went to see Dr. Doran Durand who did not think she had a muscular issue with regards to her feet, he referred her to a neurologist and I think the appointment is sometime in June. I changed her back to Iodoflex which she has used in the past but not recently. 11/03/16; the patient has been using Iodoflex although she ran out of this. Still claims that there is a lot of drainage although the wound does not look like this. No surrounding erythema. She has not been back to see Dr. Marla Roe 11/24/16; the patient has been using Iodoflex again but she ran out of it 2 or 3 days ago. There is no major change in the condition of either one of these wounds in fact they are larger and covered in a thick adherent surface slough/nonviable tissue especially on the left. She does not tolerate mechanical debridement in our clinic. Going back to see Dr. Marla Roe of plastic surgery for an operative debridement would seem reasonable. 12/15/16; the patient has not been back to see Dr. Marla Roe. She is been dealing with a series of illnesses and her husband which of monopolized her time. She is been using Sorbact which we largely supplied. She states the drainage is bad enough that it maximum she can go 2-3 days without changing the dressing 01/12/2017 -- the patient has not been back for about 4 weeks and has not seen Dr. Marla Roe not does she have any appointment pending. 01/23/17; patient has not seen Dr. Marla Roe even though I suggested this previously. She is using Santyl that was suggested last week by Dr. Con Memos this Cost her $16 through her insurance which is indeed surprising 02/12/17; continuing Santyl and the patient is changing this daily. A lot of drainage. She has not been back to see plastic surgery she is using an Ace wrap. Our intake nurse suggested wrap around stockings which would make a good reasonable alternative 02/26/17; patient is been using Santyl and changing this daily due to  drainage. She has not been to see plastic surgery she uses in April Ace wrap to control the edema. She did obtain extremitease stockings but stated that the edema in her leg was to big for these 03/20/17; patient is using Santyl and Anasept. Surfaces looked better today the area on the right is actually measuring a little smaller. She has states she has a lot of pain in her feet and ankles and is asking for a consult to pain control which I'll try to help her with through our case manager. 04/10/17; the patient arrives with better-looking wound surfaces and is slightly smaller wound on the left she is using a combination of Santyl and Anasept. She has an appointment or at least as started in the pain control center associated with [facility] 05/14/17; this is a patient who I followed for a prolonged period of time. She has venous insufficiency ulcers on her bilateral medial ankles. At one point I had this down to a much smaller wound on the left however these reopened and we've never been able to get these to heal. She has been using Santyl and Anasept gel although 2 weeks ago she ran out of the Anasept gel. She has a stable appearance of the wound. She is going to the wound care clinic at [facility]. They wanted do a nerve block/spinal block although she tells me she is reluctant to go forward with that. 05/21/17; this is a patient I have followed for many years.  She has venous insufficiency ulcers on her bilateral medial ankles. Chronic pain and deformity in her ankles as well. She is been to see plastic surgery as well as orthopedics. Using PolyMem AG most recently/Kerramax/ABDs and 2 layer compression. She has managed to keep this on and she is coming in for a nurse check to change the dressing on Tuesdays, we see her on Fridays 06/05/17; really quite a good looking surface and the area especially on the right medial has contracted in terms of dimensions. Well granulated  healthy-looking tissue on both sides. Even with an open curet there is nothing that even feels abnormal here. This is as good as I've seen this in quite some time. We have been using PolyMem AG and bringing her in for a nurse check 06/12/17; really quite good surface on both of these wounds. The right medial has contracted a bit left is not. We've been using PolyMem and AG and she is coming in for a nurse visit 06/19/17; we have been using PolyMem AG and bringing her in for a nurse check. Dimensions of her wounds are not better but the surfaces looked better bilaterally. She complained of bleeding last night and the left wound and increasing pain bilaterally. She states her wound pain is more neuropathic than just the wounds. There was some suggestion that this was radicular from her pain management doctor in talking to her it is really difficult to sort this out. 06/26/17; using PolyMem and AG and bringing her in for a nurse check as All of this and reasonably stable condition. Certainly not improved. The dimensions on the lateral part of the right leg look better but not really measuring better. The medial aspect on the left is about the same. 07/03/16; we have been using PolyMen AG and bringing her in for a nurse check to change the dressings as the wounds have drainage which precludes once weekly changing. We are using all secondary absorptive dressings.our intake nurse is brought up the idea of using a wound VAC/snap VAC on the wound to help with the drainage to see if this would result in some contraction. This is not a bad idea. The area on the right medial is actually looking smaller. Both wounds have a reasonable-looking surface. There is no evidence of cellulitis. The edema is well controlled 07/10/17; the patient was denied for a snap VAC by her insurance. The major issue with these wounds continues to be drainage. We are using wicked PolyMem AG and she is coming in for a nurse visit to change  this. The wounds are stable to slightly improved. The surface looks vibrant and the area on the right certainly has shrunk in size but very slowly 07/17/17; the patient still has large wounds on her bilateral medial malleoli. Surface of both of these wounds looks better. The dimensions seem to come and go but no consistent improvement. There is no epithelialization. We do not have options for advanced [name] products due to insurance issues. They did not approve of the wound VAC to help control the drainage. More recently we've been using PolyMem and AG wicked to allow drainage through. We have been bringing her in for a nurse visit to change this. We do not have a lot of options for wound care products and the home again due to insurance issues 07/24/17; the patient's wound actually looks somewhat better today. No drainage measurements are smaller still healthy-looking surface. We used silver collagen under PolyMen started last week. We have been  bringing her in for a dressing change 07/31/17; patient's wound surface continued to look better and I think there is visible change in the dimensions of the wound on the right. Rims of epithelialization. We have been using silver collagen under PolyMen and bringing her in for a dressing change. There appears to be less drainage although she is still in need of the dressing change 08/07/17. Patient's wound surface continues to look better on both sides and the area on the right is definitely smaller. We have been using silver collagen and PolyMen. She feels that the drainage has been it has been better. I asked her about her vascular status. She went to see Dr. Aleda Grana at [facility] and had some form of ablation. I don't have much detail on this. I haven't my notes from 2016 that she was not a candidate for any further ablation but I don't have any more information on this. We had referred her to vein and vascular I don't think she ever went. He does not  have a history of PAD although I don't have any information on this either. We don't even have ABIs in our record 08/14/17; we've been using silver collagen and PolyMen cover. And putting the patient and compression. She we are bringing her in as a nurse visit to change this because ofarge amount of drainage. We didn't the ABIs in clinic today since they had been done in many moons 1.2 bilaterally. She has been to see vein and vascular however this was at [facility] and she had ablation although I really don't have any information on this all seemed biking get a report. She is also been operatively debrided by plastic surgery and had a cell placed probably 8-12 months ago. This didn't have a major effect. We've been making some gains with current dressings 08/19/17-She is here in follow-up evaluation for bilateral medial malleoli ulcers. She continues to tolerate debridement very poorly. We will continue with recently changed topical [name]; if no significant improvement may consider switching to Iodosorb/Iodoflex. 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 She has been using silver collagen and PolyMem. I believe she has been used and tried on Iodoflex before. 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 These have been very refractory over time. We have been using silver collagen and PolyMen. She is coming in once a week for a doctor's and once a week for nurse visits. We are actually making some progress 09/18/17; the patient's wounds are smaller especially on the right medial. She arrives today to upset to consider even washing these off with Anasept which I think is been part of the reason this is been closing. We've been using collagen covered in PolyMen  otherwise. It is noted that she has a small area of folliculitis on the right medial calf that. As we are wrapping her legs I'll give her a short course of doxycycline to make sure this doesn't amount to anything. She is a long list of complaints today including imbalance, shortness of breath on exertion, inversion of her left ankle. With regards to the latter complaints she is been to see orthopedics and they offered her a tendon release surgery I believe but wanted her wounds to be closed first. I have recommended she go see her  primary physician with regards to everything else. 09/25/17; patient's wounds are about the same size. We have made some progress bilaterally although not in recent weeks. She will not allow me T wash these wounds with Anasept even if she is doing her cell. Wheeze we've been using collagen covered in PolyMen. Last week she had a small area of folliculitis this is now opened into a small wound. She completed 5 days of trimethoprim sulfamethoxazole 10/02/17; unfortunately the area on her left medial ankle is worse with a larger wound area towards the Achilles. The patient complains of a lot of pain. She will not allow debridement although visually I don't think there is anything to debridement in any case. We have been using silver collagen and PolyMen for several months now. Initially we are making some progress although I'm not really seeing that today. We will move back to [facility]. His admittedly this is a bit of a repeat however I'm hoping that his situation is different now. The patient tells me she had her leg on the left give out on her yesterday this is process some pain. 10/09/17; the patient is seen twice a week largely because of drainage issues coming out of the chronic medial bimalleolar wounds that are chronic. Last week the dimensions of the one on the left looks a little larger I changed her to [facility]. She comes in today with a history of terrible  pain in the bilateral wound areas. She will not allow debridement. She will not even allow a tissue culture. There is no surrounding erythema no no evidence of cellulitis. We have been putting her Kerlix Coban man. She will not allow more aggressive compression as there was a suggestion to put her in 3 layer wraps. 10/16/17; large wounds on her bilateral medial malleoli. These are chronic. Not much change from last week. The surface looks have healthy but absolutely no epithelialization. A lot of pain little less so of drainage. She will not allow debridement or even washing these off in the vigorous fashion with Anasept. 10/23/17; large wounds on her bilateral malleoli which are chronic. Some improvement in terms of size perhaps on the right since last time I saw these. She states that after we increased the 3 layer compression there was some bleeding, when she came in for a nurse visit she did not want 3 layer compression put back on about our nurse managed to convince her. She has known chronic venous visit issues and I'm hoping to get her to tolerate the 3 layer compression. using Hydrofera Blue 10/30/17; absolutely no change in the condition of either wound although we've had some improvement in dimensions on the right.. Attempted to put her in 3 layer compression she didn't tolerated she is back in 2 layer compression. We've been using Hydrofera Blue We looked over her past records. She had venous reflux studies in November 2016. There was no evidence of deep venous reflux on the right. Superficial vein did not show the greater saphenous vein at think this is been previously ablated the small saphenous vein was within normal limits. The left deep venous system showed no DVT the vessels were positive for deep venous reflux in the posterior tibial veins at the ankle. The greater saphenous vein was surgically absent small saphenous vein was within normal limits. She went to vein and vascular at  [facility]. I believe she had an ablation on the left greater saphenous vein. I'll update her reflux studies perhaps ever reviewed  by vein and vascular. We've made absolutely no progress in these wounds. Will also try to read and TheraSkins through her insurance 11/06/17; W the patient apparently has a 2 week follow-up with vein and vascular I like him to review the whole issue with regards to her previous vascular workup by Dr. Aleda Grana. We've really made no progress on these wounds in many months. She arrives today with less viable looking surface on the left medial ankle wound. This was apparently looking about the same on Tuesday when she was here for nurse visit. 11/13/17; deep tissue culture I did last time of the left lower leg showed multiple organisms without any predominating. In particular no Staphylococcus or group A strep were isolated. We sent her for venous reflux studies. She's had a previous left greater saphenous vein stripping and I think sclerotherapy of the right greater saphenous vein. She didn't really look at the lesser saphenous vein this both wounds are on the medial aspect. She has reflux in the common femoral vein and popliteal vein and an accessory vein on the right and the common femoral vein and popliteal vein on the left. I'm going to have her go to see vein and vascular just the look over things and see if anything else beside aggressive compression is indicated here. We have not been able to make any progress on these wounds in spite of the fact that the surface of the wounds is never look too bad. 11/20/17; no major change in the condition of the wounds. Patient reports a large amount of drainage. She has a lot of complaints of pain although enlisting her today I wonder if some of this at least his neuropathic rather than secondary to her wounds. She has an appointment with vein and vascular on 12/30/17. The refractory nature of these wounds in my mind at least  need vein and vascular to look over the wounds the recent reflux studies we did and her history to see if anything further can be done here. I also note her gait is deteriorated quite a bit. 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 She has a bilateral Trendelenburg gait. I wonder if this is neuropathic or perhaps multilevel radicular. 11/27/17; her wounds actually looks slightly better. Healthy-looking granulation tissue a scant amount of epithelialization. [place] healthcare will not pay for [employer]. They will play for tri layer Oasis and Dermagraft. This is not a diabetic ulcer. We'll try for the tri layer Oasis. She still complains of some drainage. She has a vein and vascular appointment on 12/30/17 12/04/17; the wounds visually look quite good. Healthy-looking granulation with some degree of epithelialization. We are still waiting for response to our request for trial to try layer Oasis. Her appointment with vascular to review venous and arterial issues isn't sold the end of July 7/31. Not allow debridement or even vigorous cleansing of the wound surface. 12/18/17; slightly smaller especially on the right. 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 We've been using Hydrofera Blue. We still are looking into triple layer Oasis through her insurance 01/08/18 on evaluation today patient's wound actually appears to be showing signs of good improvement at this point in time. She has been tolerating the dressing changes without complication. Fortunately there does not appear to be any evidence of infection at this point in time. We have been utilizing silver nitrate which does seem to be of benefit for her which is also good news. Overall I'm very happy with how  things seem to be both regards appearance as well as measurement. Patient did see Dr. Bridgett Larsson for evaluation on 12/30/17. In his assessment he felt that stripping would not likely add much more than chronic  compression to the patient's healing process. His recommendation was to follow-up in three months with Dr. Doren Custard if she hasn't healed in order to consider referral back to you and see vascular where she previously was in a trial and was able to get her wound to heal. I'll be see what she feels she when you staying compression and he reiterated this as well. 01/13/18 on evaluation today patient appears to actually be doing very well in regard to her bilateral medial malleolus ulcers. She seems to have tolerated the chemical cauterization with silver nitrate last week she did have some pain through that evening but fortunately states that I'll be see since it seems to be doing better she is overall pleased with the progress. 01/21/18; really quite a remarkable improvement since I've last seen these wounds. We started using silver nitrate specially on the islands of hyper granulation which for some reason her around the wound circumference. This is really done quite nicely. Primary dressing Hydrofera Blue under 4 layer compression. She seems to be able to hold out without a nurse rewrap. Follow-up in 1 week 01/28/18; we've continued the hydrofera blue but continued with chemical cauterization to the wound area that we started about a month ago for irregular hyper granulation. She is made almost stunning improvement in the overall wound dimensions. I was not really expecting this degree of improvement in these chronic wounds 02/05/18; we continue with Hydrofera Bluebut of also continued the aggressive chemical cauterization with silver nitrate. We made nice progress with the right greater than left wound. 02/12/18. We continued with Hydrofera Blue after aggressive chemical cauterization with silver nitrate. We appear to be making nice progress with both wound areas 02/19/2018; we continue with [facility] after washing the wounds vigorously with Anasept spray and chemical cauterization with silver nitrate.  We are making excellent progress. The area on the right's just about closed 02/26/2018. The area on the left medial ankle had too much necrotic debris today. I used a #5 curette we are able to get most of the soft. I continued with the silver nitrate to the much smaller wound on the right medial ankle she had a new area on her right lower pretibial area which she says was due to a role in her compression 03/05/2018; both wound areas look healthy. Not much change in dimensions from last week. I continue to use silver nitrate and Hydrofera Blue. The patient saw Dr. Doren Custard of vein and vascular. He felt she had venous stasis ulcers. He felt based on her previous arteriogram she should have adequate circulation for healing. Also she has deep venous reflux but really no significant correctable superficial venous reflux at this time. He felt we should continue with conservative management including leg elevation and compression 04/02/2018; since we last saw this woman about a month ago she had a fall apparently suffered a pelvic fracture. I did not look up the x-ray. Nevertheless because of pain she literally was bedbound for 2 weeks and had home health coming out to change the dressing. Somewhat predictably this is resulted in considerable improvement in both wound areas. The right is just about closed on the medial malleolus and the left is about half the size. 04/16/2018; both her wounds continue to go down in size. Using [facility]  Blue. 05/07/18; both her wounds appeared to be improving especially on the right where it is almost closed. We are using Hydrofera Blue 05/14/2018; slightly worse this week with larger wounds. Surface on the left medial not quite as good. We have been using Hydrofera Blue 05/21/18; again the wounds are slightly larger. Left medial malleolus slightly larger with eschar around the circumference. We have been using Hydrofera Blue undergoing a wraps for a prolonged period of time. This  got a lot better when she was more recumbent due to a fall and a back injury. I change the primary dressing the silver alginate today. She did not tolerate a 4 layer compression previously although I may need to bring this up with her next time 05/28/2018; area on the left medial malleolus again is slightly larger with more drainage. Area on the right is roughly unchanged. She has a small area of folliculitis on the right medial just on the lower calf. This does not look ominous. 06/03/2018 left medial malleolus slightly smaller in a better looking surface. We used silver nitrate on this last time with silver alginate. The area on the right appears slightly smaller 1/10; left medial malleolus slightly smaller. Small open area on the right. We used silver nitrate and silver alginate as of 2 weeks ago. We continue with the wound and compression. These got a lot better when she was off her feet 1/17; right medial malleolus wound is smaller. The left may be slightly smaller. Both surfaces look somewhat better. 1/24; both wounds are slightly smaller. Using silver alginate under Unna boots 1/31; both wounds appear smaller in fact the area on the right medial is just about closed. Surface eschar. We have been using silver alginate under Unna boots. The patient is less active now spends let much less time on her feet and I think this is contributed to the general improvement in the wound condition 2/7; both wounds appear smaller. I was hopeful the right medial would be closed however there there is still the same small open area. Slight amount of surface eschar on the left the dimensions are smaller there is eschar but the wound edges appear to be free. We have been using silver alginate under Unna boot's 2/14; both wounds once again measure smaller. Circumferential eschar on the left medial. We have been using silver alginate under Unna boots with gradual improvement 2/21; the area on the right medial  malleolus has healed. The area on the left is smaller. We have been using silver alginate and Unna boots. We can discharge wrapping the right leg she has 20/30 stockings at home she will need to protect the scar tissue in this area 2/28; the area on the right medial malleolus remains closed the patient has a compression stocking. The area on the left is smaller. We have been using silver alginate and Unna boots. 3/6 the area on the right medial ankle remains closed. Good edema control noted she is using her own compression stocking. The area on the left medial ankle is smaller. We have been managing this with silver alginate and Unna boots which we will continue today. 3/13; the area on the right medial ankle remains closed and I'm declaring it healed today. When necessary the left is about the same still a healthy-looking surface but no major change and wound area. No evidence of infection and using silver alginate under unna and generally making considerable improvement 3/27 the area on the right medial ankle remains closed the area on  the left is about the same as last week. Certainly not any worse we have been using silver alginate under an Unna boot 4/3; the area on the right medial ankle remains closed per the patient. We did not look at this wound. The wound on the left medial ankle is about the same surface looks healthy we have been using silver alginate under an Unna boot 4/10; area on the right medial ankle remains closed per the patient. We did not look at this wound. The wound on the left medial ankle is slightly larger. The patient complains that the [facility] caused burning pain all week. She also told us that she was a lot more active this week. Changed her back to silver alginate 4/17; right medial ankle still closed per the patient. Left medial ankle is slightly larger. Using silver alginate. She did not tolerate Hydrofera Blue on this area 4/24; right medial ankle remains  closed we have not look at this. The left medial ankle continues to get larger today by about a centimeter. We have been using silver alginate under Unna boots. She complains about 4 layer compression as an alternative. She has been up on her feet working on her garden 5/8; right medial ankle remains closed we did not look at this. The left medial ankle has increased in size about 100%. We have been using silver alginate under Unna boots. She noted increased pain this week and was not surprised that the wound is deteriorated 5/15; no major change in SA however much less erythema ( one week of doxy ocellulitis). 5/22-65 year old female returns at 1 week to the clinic for left medial ankle wound for which we have been using silver alginate under 3 layer compression She was placed on DOXY at last visit - the wound is wider at this visit. She is in 3 layer compression 5/29; change to [facility] last week. I had given her empiric doxycycline 2 weeks ago for a week. She is in 3 layer compression. She complains of a lot of pain and drainage on presentation today. 6/5; using Hydrofera Blue. I gave her doxycycline recently empirically for erythema and pain around the wound. Believe her cultures showed enterococcus which not would not have been well covered by doxycycline nevertheless the wound looks better and I don't feel specifically that the enterococcus needs to be covered. She has a new what looks like a wrap injury on her lateral left ankle. 6/12; she is using Hydrofera Blue. She has a new area on the left anterior lower tibial area. This was a wrap injury last week. 6/19; the patient is using Hydrofera Blue. She arrived with marked inflammation and erythema around the wound and tenderness. 12/01/18 on evaluation today patient appears to be doing a little bit better based on what I'm hearing from the standpoint of lassos evaluation to this as far as the overall appearance of the wound is concerned.  Then sometime substandard she typically sees Dr. Dellia Nims. Nonetheless overall very pleased with the progress that she's made up to this point. No fevers, chills, nausea, or vomiting noted at this time. 7/10; some improvement in the surface area. Aggressively debrided last week apparently. I went ahead with the debridement today although the patient does not tolerate this very well. We have been using Iodoflex. Still a fair amount of drainage 7/17; slightly smaller. Using Iodoflex. 7/24; no change from last week in terms of surface area. We have been using Iodoflex. Surface looks and continues to look  somewhat better 7/31; surface area slightly smaller better looking surface. We have been using Iodoflex. This is under Unna boot compression 8/7-Patient presents at 1 week with Unna boot and Iodoflex, wound appears better 8/14-Patient presents at 1 week with Iodoflex, we use the Unna boot, wound appears to be stable better.Patient is getting Botox [name] for the inversion of the foot for tendon release, Next week 8/21; we are using Iodoflex. Unna boot. The wound is stable in terms of surface area. Under illumination there is some areas of the wound that appear to be either epithelialized or perhaps this is adherent slough at this point I was not really clear. It did not wipe off and I was reluctant to debride this today. 8/28; we are using Iodoflex in an Unna boot. Seems to be making good improvement. 9/4; using Iodoflex and wound is slightly smaller. 9/18; we are using Iodoflex with topical silver nitrate when she is here. The wound continues to be smaller 10/2; patient missed her appointment last week due to GI issues. She left and Iodoflex based dressing on for 2 weeks. Wound is about the same size about the size of a dime on the left medial lower 10/9 we have been using Iodoflex on the medial left ankle wound. She has a new superficial probable wrap injury on the dorsal left ankle 10/16; we have  been using Hydrofera Blue since last week. This is on the left medial ankle 10/23; we have been using Hydrofera Blue since 2 weeks ago. This is on the left medial ankle. Dimensions are better 11/6; using Hydrofera Blue. I think the wound is smaller but still not closed. Left medial ankle 11/13; we have been using Hydrofera Blue. Wound is certainly no smaller this week. Also the surface not as good. This is the remanent of a very large area on her left medial ankle. 11/20; using Sorbact since last week. Wound was about the same in terms of size although I was disappointed about the surface debris 12/11; 3-week follow-up. Patient was on vacation. Wound is measuring slightly larger we have been using Sorbact. 12/18; wound is about the same size however surface looks better last week after debridement. We have been using Sorbact under compression 1/15 wound is probably twice the size of last time increased in length nonviable surface. We have been using Sorbact. She was running a mild fever and missed her appointment last week 1/22; the wound is come down in size but under illumination still a very adherent debris we have been Hydrofera Blue that I changed her to last week 1/29; dimensions down slightly. We have been using Hydrofera Blue 2/19 dimensions are the same however there is rims of epithelialization under illumination. Therefore more the surface area may be epithelialized 2/26; the patient's wound actually measures smaller. The wound looks healthy. We have been using Hydrofera Blue. I had some thoughts about running Apligraf then I still may do that however this looks so much better this week we will delay that for now 3/5; the wound is small but about the same as last week. We have been using Hydrofera Blue. No debridement is required today. 3/19; the wound is about the size of a dime. Healthy looking wound even under illumination. We have been using Hydrofera Blue. No mechanical  debridement is necessary 3/26; not much change from last week although still looks very healthy. We have been using Hydrofera Blue under Unna boots Patient was offered an ankle fusion by podiatry but not until  the wound heals with a proceed with this. 4/9; the patient comes in today with her original wound on the medial ankle looking satisfactory however she has some uncontrolled swelling in the middle part of her leg with 2 new open areas superiorly just lateral to the tibia. I think this was probably a wrap issue. She said she felt uncomfortable during the week but did not call in. We have been using Hydrofera Blue 4/16; the wound on the medial ankle is about the same. She has innumerable small areas superior to this across her mid tibia. I think this is probably folliculitis. She is also been working in the yard doing a lot of sweating 4/30; the patient issue on the upper areas across her mid tibia of all healed. I think this was excessive yard work if I remember. Her wound on the medial ankle is smaller. Some debris on this we have been using Hydrofera Blue under Unna boots 5/7; mid tibia. She has been using Hydrofera Blue under an Unna wrap. She is apparently going for her ankle surgery on June 3 10/28/19-Patient returns to clinic with the ankle wound, we are using Hydrofera Blue under Unna wrap, surgery is scheduled for her left foot for June 3 so she will be back for nurse visit next week READMISSION 01/17/2020 [name] is a 65 year old woman we have had in this clinic for a long period of time with severe venous hypertension and refractory wounds on her medial lower legs and ankles bilaterally. 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 When she was off her legs for a prolonged period example when she fell and suffered a compression fracture things would heal up quite  nicely. She is now retired and we managed to heal up the right medial leg wound. The left one was very tiny last time I saw this although still refractory. 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 It got to the point where this was interfering with her walking and she elected to proceed with a ankle arthrodesis to straighten her her ankle and leave her with a functional outcome for mobilization. The patient was referred to Dr. Doren Custard and really this took some time to arrange. Dr. Doren Custard saw her on 12/07/2019. Once again he verified that she had no arterial issues. She had previously had an angiogram several years ago. Follow-up ABIs on the left showed an ABI of 1.12 with triphasic waveforms and a TBI of 0.92. She is felt to have chronic deep venous insufficiency but I do not think it was felt that anything could be done from about this from an ablation point of view. At the time Dr. Doren Custard saw this patient the wounds actually look closed via the pictures in his clinic. The patient finally underwent her surgery on 12/15/2019. This went reasonably well and there was a good anatomic outcome. She developed a small distal wound dehiscence on the lateral part of the surgical wound. However more problematically she is developed recurrence of the wound on the medial left ankle. There are actually 2 wounds here one in the distal lower leg and 1 pretty much at the level of the medial malleolus. It is a more distal area that is more problematic. She has been using Hydrofera Blue which started on Friday  before this she was simply Ace wrapping. There was a culture done that showed Pseudomonas and she is on ciprofloxacin. A recent CNS on 8/11 was negative. The patient reports some pain but I generally think this is improving. She is using a cam boot completely nonweightbearing using a walker for pivot transfers and a  wheelchair 8/24; not much improvement unfortunately she has a surgical wound on the lateral part in the venous insufficiency wound medially. The bottom part of the medial insufficiency wound is still necrotic there is exposed tendon here. We have been using Hydrofera Blue under compression. Her edema control is however better 8/31; patient in for follow-up of his surgical wound on the lateral part of her left leg and chronic venous insufficiency ulcers medially. We put her back in compression last week. She comes in today with a complaint of 3 or 4 days worth of increasing pain. She felt her cam walker was rubbing on the area on the back of her heel. However there is intense erythema seems more likely she has cellulitis. She had 2 cultures done when she was seeing podiatry in the postop. One of them in late July showed Pseudomonas and she received a course of ciprofloxacin the other was negative on 8/11 she is allergic to penicillin with anaphylactoid complaints of hives oral swelling via information in epic 9/9; when I saw this patient last week she had intense anterior erythema around her wound on the right lateral heel and ankle and also into the right medial heel. Some of this was no doubt drainage and her walker boot however I was convinced she had cellulitis. I gave her Levaquin and Bactrim she is finishing up on this now. She is following up with Dr. [name] he saw her yesterday. He is taken her out of the walking boot of course she is still nonweightbearing. Her x-ray was negative for any worrisome features such as soft tissue air etc. Things are a lot better this week. She has home health. We have been using Hydrofera Blue under an [employer] which she put back on yesterday. I did not wrap her last week 9/17; her surrounding skin looks a lot better. In fact the area on the left lateral ankle has just a scant amount of eschar. The only remaining wound is the large area on the left medial ankle.  Probably about 60% of this is healthy granulation at the surface however she has a significant divot distally. This has adherent debris in it. I been using debridement and silver collagen to try and get this area to fill-in although I do not think we have made much progress this week 9/24; the patient's wound on the left medial ankle looks a lot better. The deeper divot area distally still requires debridement but this is cleaning up quite nicely we have been using silver collagen. The patient is complaining of swelling in her foot and is worried that that is contributing to the nonhealing of the ankle wound. She is also complaining of numbness in her anterior toes 10/4; left medial ankle. The small area distally still has a divot with necrotic material that I have been debriding away. This has an undermining area. She is approved for Apligraf. She saw Dr. [name] her surgeon on 10/1. I think he declared himself is satisfied with the condition of things. Still nonweightbearing till the end of the month. 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 Her surgical wound is well closed. There  is no evidence of infection 10/11; the patient arrived in clinic today with the expectation that we be able to put an Apligraf on this area after debridement however she arrives with a relatively large amount of green drainage on the dressing. The patient states that this started on Friday. She has not been systemically unwell. 10/19; culture I did last week showed both Enterococcus and Pseudomonas. I think this came in separate parts because I stopped her ciprofloxacin I gave her and prescribed her linezolid however now looking at the final culture result this is Pseudomonas which is resistant to quinolones. She has not yet picked up the linezolid apparently phone issues. We are also trying to get a topical antibiotic out of [place] in [place] they can be applied by home health. She  is still having green drainage 10/16; the patient has her topical antibiotic from [facility] in [place]. This is a compounded gel with vancomycin and ciprofloxacin and gentamicin. We are applying this on the wound bed with silver alginate over the top with Unna boot wraps. She arrives in clinic today with a lot less ominous looking drainage although she is only use this topical preparation once the second time today. She sees Dr. [name] her surgeon on Friday she has home health changing the dressing 11/2; still using her compounded topical antibiotic under silver alginate. Surface is cleaning up there is less drainage. We had an Apligraf for her today and I elected to apply it. A light coating of her antibiotic 04/25/2020 upon evaluation today patient appears to be doing well with regard to her ankle ulcer. There is a little bit of slough noted on the surface of the wound I am can have to perform sharp debridement to clear this away today. With that being said other than that fact overall I feel like she is making progress and we do see some new epithelial growth. There is also some improvement in the depth of the wound and that distal portion. There is little bit of slough there as well. 12/7; 2-week follow-up. Apligraf #3. Dimensions are smaller. Closing in especially inferiorly. Still some surface debris. Still using the [facility] topical antibiotic but I told her that I don't think this needs to be renewed 12/21; 2-week follow-up. Apligraf #4 dimensions are smaller. Nice improvement 06/05/2020; 2-week follow-up. The patient's wound on the left medial ankle looks really excellent. Nice granulation. Advancing epithelialization no undermining no evidence of infection. We would have to reapply for another Apligraf but with the condition of this wound I did not feel strongly about it. We used Hydrofera Blue under the same degree of compression. She follows up with Dr. [name] her surgeon a week  Friday 06/13/2020 upon evaluation today patient appears to be doing excellent in regard to her wound. She has been tolerating the dressing changes without complication. Fortunately there is no signs of active infection at this time. No fevers, chills, nausea, vomiting, or diarrhea. She was using Hydrofera Blue last week. 06/20/2020 06/20/2020 on evaluation today patient actually appears to be doing excellent in regard to her wound. This is measuring better and looking much better as well. She has been using the collagen that seems to be doing better for her as well even though the [facility] was and is not sticking or feeling as rough on her wound. She did see Dr. [name] on Friday he is very pleased he also stated none of the hardware has shifted. That is great news 1/27; the patient  has a small clean wound all that is remaining. I agree that this is too small to really consider an Apligraf. Under illumination the surface is looking quite good. We have been using collagen although the dimensions are not any better this week 2/2; the patient has a small clean wound on the left medial ankle. Although this left of her substantial original areas. Measurements are smaller. We have been using polymen Ag under an Haematologist. 2/10; small area on the left medial ankle. This looks clean nothing to debride however dimensions are about the same we have been using polymen I think now for 2 weeks 2/17; not much change in surface area. We have been using polymen Ag without any improvement. 3/17; 1 month follow-up. The patient has been using endoform without any improvement in fact I think this looks worse with more depth and more expansion 3/24; no improvement. Perhaps less debris on the surface. We have been using Sorbact for 1 week 4/4; wound measures larger. She has edema in her leg and her foot which she tells as her wrap came down. We have been using Unna boots. Sorbact of the wound. She has been approved for  Apligraf 09/12/2020 upon evaluation today patient appears to be doing well with regard to her wound. We did get the Apligraf reapproved which is great news we have that available for application today. Fortunately there is no signs of infection and overall the patient seems to be doing great. The wound bed is nice and clean. 4/27; patient presents for her second application of Apligraf. She states over the past week she has been on her feet more often due to being outside in her garden. She has noted more swelling to her foot as a result. She denies increased warmth, pain or erythema to the wound site. 10/10/2020 upon evaluation today patient appears to be doing well with regard to her wound which does not appear to be quite as irritated as last week from what I am hearing. With that being said unfortunately she is having issues with some erythema and warmth to touch as well as an increase size. I do believe this likely is infected. 10/17/2020 upon evaluation today patient appears to be doing excellent in regard to her wound this is significantly improved as compared to last week. Fortunately I think that the infection is much better controlled at this point. She did have evidence of both Enterococcus as well as Staphylococcus noted on culture. Enterococcus really would not be helped significantly by the Cipro but the wound is doing so much better I am under the assumption that the Staphylococcus is probably the main organism that is causing the current infection. Nonetheless I think that she is doing excellent as far as that is concerned and I am very pleased in that regard. I would therefore recommend she continue with the Cipro. 10/31/2020 upon evaluation today patient appears to be doing well with regard to her wound. She has been tolerating the dressing changes without complication. Fortunately there is no signs of active infection and overall I am extremely pleased with where things stand today. No  fevers, chills, nausea, vomiting, or diarrhea. With that being said she does have some green drainage coming from the wound and although it looks okay I am a little concerned about the possibility of a continuing infection. Specifically with Pseudomonas. For that reason I will go ahead and send in a prescription for Cipro for her to be continued. 11/14/2020 upon evaluation today  patient appears to be doing very well currently in regard to her wound on her leg. She has been tolerating the dressing changes without complication. Fortunately I feel like the infection is finally under good control here. Unfortunately we do not have the Apligraf for application today although we can definitely order to have it in place for next week. That will be her fifth and final of the current series. Nonetheless I feel like her wound is really doing quite well which is great news. 11/21/2020 upon evaluation today patient appears to be doing well with regard to her wound on the medial ankle. Fortunately I think the infection is under control and I do believe we can go ahead and reapply the Apligraf today. She is in agreement with that plan. There does not appear to be any signs of active infection at this time which is great news. No fevers, chills, nausea, vomiting, or diarrhea. 12/05/2020 upon evaluation today patient's wound bed actually showed signs of good granulation epithelization at this point. There does not appear to be any signs of infection which is great news and overall very pleased with where things stand. Overall the patient seems to be doing fairly well in my opinion with regard to her wound although I do believe she continues to build up a lot of biofilm I think she could benefit from using PuraPly at this point. 12/12/2020 upon evaluation today patient's wound actually appears to be doing decently well today. The Unna boot has not been quite as well-tolerated so that more uncomfortable for her and even  causing some pressure over the plate on the lateral portion of her foot which is 90 where the wound is. There did not appear to be any significant deep tissue injury with that there may be a minimal change in the skin noted I think that we may want to go back to the Coflex 2 layer which is a little bit easier on her skin it seems. 12/19/2020 upon evaluation today patient actually seems to be making great progress with the PuraPly currently. She in fact seems to be much better as far as the overall appearance of the wound bed is concerned I am very happy in this regard. I do not see any signs of of infection which is great news as well. No fevers, chills, nausea, vomiting, or diarrhea. 12/26/2020 upon evaluation today patient appears to actually be doing better in regard to her wound on the left medial ankle region. The surface of the wound is actually doing significantly better which is great news. There does not appear to be any signs of infection which is also great news and in general I am extremely pleased with where we stand today. Electronic Signature(s) Signed: 12/26/2020 11:53:30 AM By: [name] Keeler PA-C Entered By: [name] on 12/26/2020 11:53:30 -------------------------------------------------------------------------------- Physical Exam Details Patient Name: Date of Service: [facility] [name], [name] 12/26/2020 10:45 A M Medical Record Number: [id number] Patient Account Number: [id number] Date of Birth/Sex: Treating RN: 1956/03/11 (65 y.o. [name]: [name] Other Clinician: Referring [name]: Treating [name]/Extender: [name]: 10 Constitutional Well-nourished and well-hydrated in no acute distress. Respiratory normal breathing without difficulty. Psychiatric this patient is able to make decisions and demonstrates good insight into disease process. Alert and Oriented x 3. pleasant and  cooperative. Notes Patient's wound bed showed signs of good granulation epithelization at this point. There does not appear to be any  signs of infection which is great and overall I am extremely pleased with where the patient stands today. No fevers, chills, nausea, vomiting, or diarrhea. Electronic Signature(s) Signed: 12/26/2020 11:53:45 AM By: [name] Keeler PA-C Entered By: [name] on 12/26/2020 11:53:45 -------------------------------------------------------------------------------- Physician Orders Details Patient Name: Date of Service: [facility] [name], [name] 12/26/2020 10:45 A M Medical Record Number: [id number] Patient Account Number: [id number] Date of Birth/Sex: Treating RN: Feb 14, 1956 (65 y.o. [name]: [name] Other Clinician: Referring [name]: Treating [name]/Extender: [name]: 49 Verbal / Phone Orders: No Diagnosis Coding Follow-up Appointments ppointment in 1 week. - with Margarita Grizzle - Puraply Return A Cellular or Tissue Based Products Cellular or Tissue Based Product Type: - PuraPly: #3 Bathing/ Shower/ Hygiene May shower with protection but do not get wound dressing(s) wet. Edema Control - Lymphedema / SCD / Other Elevate legs to the level of the heart or above for 30 minutes daily and/or when sitting, a frequency of: - throughout the day Avoid standing for long periods of time. Exercise regularly Compression stocking or Garment 20-30 mm/Hg pressure to: - right leg daily Additional Orders / Instructions Follow Nutritious Diet Wound [name] Wound #15 - Malleolus Wound Laterality: Left, Medial Cleanser: Soap and Water 1 x Per Week/30 Days Discharge Instructions: May shower and wash wound with dial antibacterial soap and water prior to dressing change. Peri-Wound Care: Triamcinolone 15 (g) 1 x Per Week/30 Days Discharge Instructions: Use triamcinolone 15 (g) mixed with  lotion Peri-Wound Care: Sween Lotion (Moisturizing lotion) 1 x Per Week/30 Days Discharge Instructions: Apply moisturizing lotion as directed Secondary Dressing: Woven Gauze Sponge, Non-Sterile 4x4 in 1 x Per Week/30 Days Discharge Instructions: Apply over primary dressing as directed. Secondary Dressing: ABD Pad, 5x9 1 x Per Week/30 Days Discharge Instructions: Apply over primary dressing and lateral ankle Secondary Dressing: Optifoam Non-Adhesive Dressing, 4x4 in 1 x Per Week/30 Days Discharge Instructions: Apply over primary dressing pad lateral foot with foam donut Secondary Dressing: ADAPTIC TOUCH 3x4.25 in 1 x Per Week/30 Days Discharge Instructions: Apply over skin sub Compression Wrap: CoFlex TLC XL 2-layer Compression System 4x7 (in/yd) 1 x Per Week/30 Days Discharge Instructions: Apply CoFlex 2-layer compression as directed. (alt for 4 layer) Electronic Signature(s) Signed: 12/26/2020 4:30:28 PM By: [name] Keeler PA-C Signed: 12/26/2020 5:32:13 PM By: [name] Gouty RN, BSN Entered By: [name] on 12/26/2020 11:48:29 -------------------------------------------------------------------------------- Problem List Details Patient Name: Date of Service: [name] [name], [name] 12/26/2020 10:45 A M Medical Record Number: [id number] Patient Account Number: [id number] Date of Birth/Sex: Treating RN: 11/06/1955 (65 y.o. [name]: [name] Other Clinician: Referring [name]: Treating [name]/Extender: [name]: 51 Active Problems ICD-10 Encounter Code Description Active Date MDM Diagnosis I87.332 Chronic venous hypertension (idiopathic) with ulcer and inflammation of left 01/17/2020 No Yes lower extremity L97.828 Non-pressure chronic ulcer of other part of left lower leg with other specified 01/17/2020 No Yes severity L97.328 Non-pressure chronic ulcer of left ankle with other specified severity  01/17/2020 No Yes Inactive Problems ICD-10 Code Description Active Date Inactive Date L03.116 Cellulitis of left lower limb 01/31/2020 01/31/2020 T81.31XD Disruption of external operation (surgical) wound, not elsewhere classified, subsequent 01/17/2020 01/17/2020 encounter Resolved Problems Electronic Signature(s) Signed: 12/26/2020 11:53:06 AM By: [name] Keeler PA-C Entered By: [name] on 12/26/2020 11:53:06 -------------------------------------------------------------------------------- Progress Note Details Patient Name: Date of Service: [name] [name], Epimenio Foot  NO [name] 12/26/2020 10:45 A M Medical Record Number: [id number] Patient Account Number: [id number] Date of Birth/Sex: Treating RN: [birth year] (65 y.o. [name]: [name] Other Clinician: Referring [name]: Treating [name]/Extender: [name]: 19 Subjective Chief Complaint Information obtained from Patient patient is been followed long-term in this clinic for venous insufficiency ulcers with inflammation, hypertension and ulceration over the medial ankle bilaterally. 01/17/2020; this is a patient who is here for review of postoperative wounds on the left lateral ankle and recurrence of venous stasis ulceration on the left medial History of Present Illness (HPI) the remaining wound is over the left medial ankle. Similar wound over the right medial ankle healed largely with use of Apligraf. Most recently we have been using Hydrofera Blue over this wound with considerable improvement. The patient has been extensively worked up in the past for her venous insufficiency and she is not a candidate for antireflux surgery although I have none of the details available currently. 08/24/14; considerable improvement today. About 50% of this wound areas now epithelialized. The base of the wound appears to be healthier granulation.as opposed to last week when she had  deteriorated a considerable improvement 08/17/14; unfortunately the wound has regressed somewhat. The areas of epithelialization from the superior aspect are not nearly as healthy as they were last week. The patient thinks her Hydrofera Blue slipped. 09/07/14; unfortunately the area has markedly regressed in the 2 weeks since I've seen this. There is an [name] surrounding erythema. The healthy granulation tissue that we had at the base of the wound now is a dusky color. The nurse reports green drainage 09/14/14; the area looks somewhat better than last week. There is less erythema and less drainage. The culture I did did not show any growth. Nevertheless I think it is better to continue the Cipro and doxycycline for a further week. The remaining wound area was debridement. 09/21/14. Wound did not require debridement last week. Still less erythema and less drainage. She can complete her antibiotics. The areas of epithelialization in the superior aspect of the wound do not look as healthy as they did some weeks ago 10/05/14 continued improvement in the condition of this wound. There is advancing epithelialization. Less aggressive debridement required 10/19/14 continued improvement in the condition and volume of this wound. Less aggressive debridement to the inferior part of this to remove surface slough and fibrinous eschar 11/02/14 no debridement is required. The surface granulation appears healthy although some of her islands of epithelialization seem to have regressed. No evidence of infection 11/16/14; lites surface debridement done of surface eschar. The wound does not look to be unhealthy. No evidence of infection. Unfortunately the patient has had podiatry issues in the right foot and for some reason has redeveloped small surface ulcerations in the medial right ankle. Her original presentation involved wounds in this area 11/23/14 no debridement. The area on the right ankle has enlarged. The left ankle wound  appears stable in terms of the surface although there is periwound inflammation. There has been regression in the amount of new skin 11/30/14 no debridement. Both wound areas appear healthy. There was no evidence of infection. The the new area on the right medial ankle has enlarged although that both the surfaces appear to be stable. 12/07/14; Debridement of the right medial ankle wound. No no debridement was done on the left. 12/14/14 no major change in and now bilateral medial ankle wounds. Both of these are very  painful but the no overt evidence of infection. She has had previous venous ablation 12/21/14; patient states that her right medial ankle wound is considerably more painful last week than usual. Her left is also somewhat painful. She could not tolerate debridement. The right medial ankle wound has fibrinous surface eschar 12/28/14 this is a patient with severe bilateral venous insufficiency ulcers. For a considerable period of time we actually had the one on the right medial ankle healed however this recently opened up again in June. The left medial ankle wound has been a refractory area with some absent flows. We had some success with Hydrofera Blue on this area and it literally closed by 50% however it is recently opened up Foley. Both of these were debridement today of surface eschar. She tolerates this poorly 01/25/15: No change in the status of this. Thick adherent escar. Very poor tolerance of any attempt at debridement. I had healed the right medial malleolus wound for a considerable amount of time and had the left one down to about 50% of the volume although this is totally regressed over the last 48 weeks. Further the right leg has reopened. she is trying to make a appointment with pain and vascular, previous ablations with Dr. Aleda Grana. I do not believe there is an arterial insufficiency issue here 02/01/15 the status of the adherent eschar bilaterally is actually improved. No  debridement was done. She did not manage to get vascular studies done 02/08/15 continued debridement of the area was done today. The slough is less adherent and comes off with less pressure. There is no surrounding infection peripheral pulses are intact 02/15/15 selective debridement with a disposable curette. Again the slough is less adherent and comes off with less difficulty. No surrounding infection peripheral pulses are intact. 02/22/15 selective debridement of the right medial ankle wound. Slough comes off with less difficulty. No obvious surrounding infection peripheral pulses are intact I did not debridement the one on the left. Both of these are stable to improved 03/01/15 selective debridement of both wound areas using a curette to. Adherent slough cup soft with less difficulty. No obvious surrounding infection. The patient tells me that 2 days ago she noted a rash above the right leg wrap. She did not have this on her lower legs when she change this over she arrives with widespread left greater than right almost folliculitis-looking rash which is extremely pruritic. I don't see anything to culture here. There is no rash on the rest of her body. She feels well systemically. 03/08/15; selective debridement of both wounds using a curette. Base of this does not look unhealthy. She had limegreen drainage coming out of the left leg wound and describes a lot of drainage. The rash on her left leg looks improved to. No cultures were done. 03/22/15; patient was not here last week. Basal wounds does not look healthy and there is no surrounding erythema. No drainage. There is still a rash on the left leg that almost looks vasculitic however it is clearly limited to the top of where the wrap would be. 04/05/15; on the right required a surgical debridement of surface eschar and necrotic subcutaneous tissue. I did not debridement the area on the left. These continue to be large open wounds that are not changing  that much. We were successful at one point in healing the area on the right, and at the same time the area on the left was roughly half the size of current measurements. I think a lot  of the deterioration has to do with the prolonged time the patient is on her feet at work 04/19/15 I attempted-like surface debridement bilaterally she does not tolerate this. She tells me that she was in allergic care yesterday with extreme pain over her left lateral malleolus/ankle and was told that she has an "sprain" 05/03/15; large bilateral venous insufficiency wounds over the medial malleolus/medial aspect of her ankles. She complains of copious amounts of drainage and his usual large amounts of pain. There is some increasing erythema around the wound on the right extending into the medial aspect of her foot to. historically she came in with these wounds the right one healed and the left one came down to roughly half its current size however the right one is reopened and the left is expanded. 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 05/10/15 large bilateral venous insufficiency wounds. There is less adherence surface left however the surface culture that I did last week grew pseudomonas therefore bilateral selective debridement score necessary. There is surrounding erythema. The patient describes severe bilateral drainage and a lot of pain in the left ankle. Apparently her podiatrist was were ready to do a cortisone shot 05/17/15; the patient complains of pain and again copious amounts of drainage. 05/24/15; we used Iodo flex last week. Patient notes considerable improvement in wound drainage. Only needed to change this once. 05/31/15; we continued Iodoflex; the base of these large wounds bilaterally is not too bad but there is probably likely a significant bioburden here. I would like to debridement just doesn't tolerate it. 06/06/14 I would like to continue the  Iodoflex although she still hasn't managed to obtain supplies. She has bilateral medial malleoli or large wounds which are mostly superficial. Both of them are covered circumferentially with some nonviable fibrinous slough although she tolerates debridement very poorly. She apparently has an appointment for an ablation on the right leg by interventional radiology. 06/14/15; the patient arrives with the wounds and static condition. We attempted a debridement although she does not do well with this secondary to pain. I 07/05/15; wounds are not much smaller however there appears to be a cleaner granulating base. The left has tight fibrinous slough greater than the right. Debridement is tolerated poorly due to pain. Iodoflex is done more for these wounds in any of the multitude of different dressings I have tried on the left 1 and then subsequently the right. 07/12/15; no change in the condition of this wound. I am able to do an aggressive debridement on the right but not the left. She simply cannot tolerate it. We have been using Iodoflex which helps somewhat. It is worthwhile remembering that at one point we healed the right medial ankle wound and the left was about 25% of the current circumference. We have suggested returning to vascular surgery for review of possible further ablations for one reason or another she has not been able to do this. 07/26/15 no major change in the condition of either wound on her medial ankle. I did not attempt to debridement of these. She has been aggressively scrubbing these while she is in the shower at home. She has her supply of Iodoflex which seems to have done more for these wounds then anything I have put on recently. 08/09/15 wound area appears larger although not verified by measurements. Using Iodoflex 09/05/2015 -- she was here for avisit today but had significant problems with the  wound and I was asked to see her for a physician opinion. I have summarize that this  lady has had surgery on her left lower extremity about 10 years ago where the possible veins stripping was done. She has had an opinion from interventional radiology around November 2016 where no further sclerotherapy was ordered. The patient works 12 hours a day and stands on a concrete floor with work boots and is unable to get the proper compression she requires and cannot elevate her limbs appropriately at any given time. She has recently grown Pseudomonas from her wound culture but has not started her ciprofloxacin which was called in for her. 09/13/15 this continues to be a difficult situation for this patient. At one point I had this wound down to a 1.5 x 1.5" wound on her left leg. This is deteriorated and the right leg has reopened. She now has substantial wounds on her medial calcaneus, malleoli and into her lower leg. One on the left has surface eschar but these are far too painful for me to debridement here. She has a vascular surgery appointment next week to see if anything can be done to help here. I think she has had previous ablations several years ago at [facility]. She has no major edema. She tells me that she did not get product last time [facility] Ag] and went for several days without it. She continues to work in work boots 12 hours a day. She cannot get compression/4-layer under her work boots. 09/20/15 no major change. Periwound edema control was not very good. Her point with pain and vascular is next Wednesday the 25th 09/28/15; the patient is seen vascular surgery and is apparently scheduled for repeat duplex ultrasounds of her bilateral lower legs next week. 10/05/15; the patient was seen by Dr. Doren Custard of vascular surgery. He feels that she should have arterial insufficiency excluded as cause/contributed to her nonhealing stage she is therefore booked for an arteriogram. She has apparently monophasic signals in the dorsalis pedis pulses. She also of course has known severe chronic  venous insufficiency with previous procedures as noted previously. I had another long discussion with the patient today about her continuing to work 12 hour shifts. I've written her out for 2 months area had concerns about this as her work location is currently undergoing significant turmoil and this may lead to her termination. She is aware of this however I agree with her that she simply cannot continue to stand for 12 hours multiple days a week with the substantial wound areas she has. 10/19/15; the Dr. Doren Custard appointment was largely for an arteriogram which was normal. She does not have an arterial issue. He didn't make a comment about her chronic venous insufficiency for which she has had previous ablations. Presumably it was not felt that anything additional could be done. The patient is now out of work as I prescribed 2 weeks ago. Her wounds look somewhat less aggravated presumably because of this. I felt I would give debridement another try today 10/25/15; no major change in this patient's wounds. We are struggling to get her product that she can afford into her own home through her insurance. 11/01/15; no major change in the patient's wounds. I have been using silver alginate as the most affordable product. I spoke to Dr. Marla Roe last week with her requested take her to the OR for surgical debridement and placement of ACEL. Dr. Marla Roe told me that she would be willing to do this however [facility]  [employer] will not cover this, fortunately the patient has [place] healthcare of some variant 11/08/15; no major change in the patient's wounds. She has been completely nonviable surface that this but is in too much pain with any attempted debridement are clinic. I have arranged for her to see Dr. Marla Roe ham of plastic surgery and this appointment is on Monday. I am hopeful that they will take her to the OR for debridement, possible ACEL ultimately possible skin graft 11/22/15 no major change in  the patient's wounds over her bilateral medial calcaneus medial malleolus into the lower legs. Surface on these does not look too bad however on the left there is surrounding erythema and tenderness. This may be cellulitis or could him sleepy tinea. 11/29/15; no major changes in the patient's wounds over her bilateral medial malleolus. There is no infection here and I don't think any additional antibiotics are necessary. There is now plan to move forward. She sees Dr. Marla Roe in a week's time for preparation for operative debridement and ACEL placement I believe on 7/12. She then has a follow-up appointment with Dr. Marla Roe on 7/21 12/28/15; the patient returns today having been taken to the [facility] by Dr. Marla Roe 12/12/15 she underwent debridement, intraoperative cultures [which were negative]. She had placement of a wound VAC. Parent really ACEL was not available to be placed. The wound VAC foam apparently adhered to the wound since then she's been using silver alginate, Xeroform under Ace wraps. She still says there is a lot of drainage and a lot of pain 01/31/16; this is a patient I see monthly. I had referred her to Dr. Marla Roe him of plastic surgery for large wounds on her bilateral medial ankles. She has been to the OR twice once in early July and once in early August. She tells me over the last 3 weeks she has been using the wound VAC with ACEL underneath it. On the right we've simply been using silver alginate. Under Kerlix Coban wraps. 02/28/16; this is a patient I'm currently seeing monthly. She is gone on to have a skin graft over her large venous insufficiency ulcer on the left medial ankle. This was done by Dr. Marla Roe him. The patient is a bit perturbed about why she didn't have one on her right medial ankle wound. She has been using silver alginate to this. 03/06/16; I received a phone call from her plastic surgery Dr. Marla Roe. She expressed some concern about the viability of  the skin graft she did on the left medial ankle wound. Asked me to place Endoform on this. She told me she is not planning to do a subsequent skin graft on the right as the left one did not take very well. I had placed Hydrofera Blue on the right 03/13/16; continue to have a reasonably healthy wound on the right medial ankle. Down to 3 mm in terms of size. There is epithelialization here. The area on the left medial ankle is her skin graft site. I suppose the last week this looks somewhat better. She has an open area inferiorly however in the center there appears to be some viable tissue. There is a lot of surface callus and eschar that will eventually need to come off however none of this looked to be infected. Patient states that the is able to keep the dressing on for several days which is an improvement. 03/20/16 no major change in the circumference of either wound however on the left side the patient was at Dr.  Dillingham's office and they did a debridement of left wound. 50% of the wound seems to be epithelialized. I been using Endoform on the left Hydrofera Blue in the right 03/27/16; she arrives today with her wound is not looking as healthy as they did last week. The area on the right clearly has an adherent surface to this a very similar surface on the left. Unfortunately for this patient this is all too familiar problem. Clearly the Endoform is not working and will need to change that today that has some potential to help this surface. She does not tolerate debridement in this clinic very well. She is changing the dressing wants 04/03/16; patient arrives with the wounds looking somewhat better especially on the right. Dr. Migdalia Dk change the dressing to silver alginate when she saw her on Monday and also sold her some compression socks. The usefulness of the latter is really not clear and woman with severely draining wounds. 04/10/16; the patient is doing a bit of an experiment wearing the  compression stockings that Dr. Migdalia Dk provided her to her left leg and the out of legs based dressings that we provided to the right. 05/01/16; the patient is continuing to wear compression stockings Dr. Migdalia Dk provided her on the left that are apparently silver impregnated. She has been using Iodoflex to the right leg wound. Still a moderate amount of drainage, when she leaves here the wraps only last for 4 days. She has to change the stocking on the left leg every night 05/15/16; she is now using compression stockings bilaterally provided by Dr. Marla Roe. She is wearing a nonadherent layer over the wounds so really I don't think there is anything specific being done to this now. She has some reduction on the left wound. The right is stable. I think all healing here is being done without a specific dressing 06/09/16; patient arrives here today with not much change in the wound certainly in diameter to large circular wounds over the medial aspect of her ankle bilaterally. Under the light of these services are certainly not viable for healing. There is no evidence of surrounding infection. She is wearing compression stockings with some sort of silver impregnation as prescribed by Dr. Marla Roe. She has a follow-up with her tomorrow. 06/30/16; no major change in the size or condition of her wounds. These are still probably covered with a nonviable surface. She is using only her purchase stockings. She did see Dr. Marla Roe who seemed to want to apply Dakin's solution to this I'm not extreme short what value this would be. I would suggest Iodoflex which she still has at home. 07/28/16; I follow Mrs. Mote episodically along with Dr. Marla Roe. 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 She has been applying a topical collagen ointment to both wounds with Adaptic. I don't think Dr. Marla Roe is planning to take her back to the OR. 08/19/16;  I follow [name] on a monthly basis along with Dr. Marla Roe of plastic surgery. 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 I been following her for a number of years. At one point I was able to get the right medial malleolus wound to heal and had the left medial malleolus down to about half its current size however and I had to send her to plastic surgery for an operative debridement. Since then things have been stable to slightly improve the area on the  right is slightly better one in the left about the same although there is much less adherent surface than I'm used to with this patient. She is using some form of liquid collagen gel that Dr. Marla Roe provided a Kerlix cover with the patient's own pressure stockings. She tells me that she has extreme pain in both ankles and along the lateral aspect of both feet. She has been unable to work for some period of time. She is telling me she is retiring at the beginning of April. She sees Dr. Doran Durand of orthopedics next week 09/22/16; patient has not seen Dr. Marla Roe since the last time she is here. I'm not really sure what she is using to the wounds other than bits and pieces of think she had left over including most recently Hydrofera Blue. She is using juxtalite stockings. She is having difficulty with her husband's recent illness "stroke". She is having to transport him to various doctors appointments. Dr. Marla Roe left her the option of a repeat debridement with ACEL however she has not been able to get the time to follow-up on this. She continues to have a fair amount of drainage out of these wounds with certainly precludes leaving dressings on all week 10/13/16; patient has not seen Dr. Marla Roe since she was last in our clinic. I'm not really sure what she is doing with the wounds, we did try to get her [facility] and I think she is actually using this most of the  time. Because of drainage she states she has to change this every second day although this is an improvement from what she used to do. She went to see Dr. Doran Durand who did not think she had a muscular issue with regards to her feet, he referred her to a neurologist and I think the appointment is sometime in June. I changed her back to Iodoflex which she has used in the past but not recently. 11/03/16; the patient has been using Iodoflex although she ran out of this. Still claims that there is a lot of drainage although the wound does not look like this. No surrounding erythema. She has not been back to see Dr. Marla Roe 11/24/16; the patient has been using Iodoflex again but she ran out of it 2 or 3 days ago. There is no major change in the condition of either one of these wounds in fact they are larger and covered in a thick adherent surface slough/nonviable tissue especially on the left. She does not tolerate mechanical debridement in our clinic. Going back to see Dr. Marla Roe of plastic surgery for an operative debridement would seem reasonable. 12/15/16; the patient has not been back to see Dr. Marla Roe. She is been dealing with a series of illnesses and her husband which of monopolized her time. She is been using Sorbact which we largely supplied. She states the drainage is bad enough that it maximum she can go 2-3 days without changing the dressing 01/12/2017 -- the patient has not been back for about 4 weeks and has not seen Dr. Marla Roe not does she have any appointment pending. 01/23/17; patient has not seen Dr. Marla Roe even though I suggested this previously. She is using Santyl that was suggested last week by Dr. Con Memos this Cost her $16 through her insurance which is indeed surprising 02/12/17; continuing Santyl and the patient is changing this daily. A lot of drainage. She has not been back to see plastic surgery she is using an Ace wrap. Our intake nurse suggested wrap  around stockings  which would make a good reasonable alternative 02/26/17; patient is been using Santyl and changing this daily due to drainage. She has not been to see plastic surgery she uses in April Ace wrap to control the edema. She did obtain extremitease stockings but stated that the edema in her leg was to big for these 03/20/17; patient is using Santyl and Anasept. Surfaces looked better today the area on the right is actually measuring a little smaller. She has states she has a lot of pain in her feet and ankles and is asking for a consult to pain control which I'll try to help her with through our case manager. 04/10/17; the patient arrives with better-looking wound surfaces and is slightly smaller wound on the left she is using a combination of Santyl and Anasept. She has an appointment or at least as started in the pain control center associated with  regional 05/14/17; this is a patient who I followed for a prolonged period of time. She has venous insufficiency ulcers on her bilateral medial ankles. At one point I had this down to a much smaller wound on the left however these reopened and we've never been able to get these to heal. She has been using Santyl and Anasept gel although 2 weeks ago she ran out of the Anasept gel. She has a stable appearance of the wound. She is going to the wound care clinic at [facility]. They wanted do a nerve block/spinal block although she tells me she is reluctant to go forward with that. 05/21/17; this is a patient I have followed for many years. She has venous insufficiency ulcers on her bilateral medial ankles. Chronic pain and deformity in her ankles as well. She is been to see plastic surgery as well as orthopedics. Using PolyMem AG most recently/Kerramax/ABDs and 2 layer compression. She has managed to keep this on and she is coming in for a nurse check to change the dressing on Tuesdays, we see her on Fridays 06/05/17; really quite a good looking surface  and the area especially on the right medial has contracted in terms of dimensions. Well granulated healthy-looking tissue on both sides. Even with an open curet there is nothing that even feels abnormal here. This is as good as I've seen this in quite some time. We have been using PolyMem AG and bringing her in for a nurse check 06/12/17; really quite good surface on both of these wounds. The right medial has contracted a bit left is not. We've been using PolyMem and AG and she is coming in for a nurse visit 06/19/17; we have been using PolyMem AG and bringing her in for a nurse check. Dimensions of her wounds are not better but the surfaces looked better bilaterally. She complained of bleeding last night and the left wound and increasing pain bilaterally. She states her wound pain is more neuropathic than just the wounds. There was some suggestion that this was radicular from her pain management doctor in talking to her it is really difficult to sort this out. 06/26/17; using PolyMem and AG and bringing her in for a nurse check as All of this and reasonably stable condition. Certainly not improved. The dimensions on the lateral part of the right leg look better but not really measuring better. The medial aspect on the left is about the same. 07/03/16; we have been using PolyMen AG and bringing her in for a nurse check to change the dressings as the wounds have  drainage which precludes once weekly changing. We are using all secondary absorptive dressings.our intake nurse is brought up the idea of using a wound VAC/snap VAC on the wound to help with the drainage to see if this would result in some contraction. This is not a bad idea. The area on the right medial is actually looking smaller. Both wounds have a reasonable-looking surface. There is no evidence of cellulitis. The edema is well controlled 07/10/17; the patient was denied for a snap VAC by her insurance. The major issue with these wounds continues  to be drainage. We are using wicked PolyMem AG and she is coming in for a nurse visit to change this. The wounds are stable to slightly improved. The surface looks vibrant and the area on the right certainly has shrunk in size but very slowly 07/17/17; the patient still has large wounds on her bilateral medial malleoli. Surface of both of these wounds looks better. The dimensions seem to come and go but no consistent improvement. There is no epithelialization. We do not have options for advanced [name] products due to insurance issues. They did not approve of the wound VAC to help control the drainage. More recently we've been using PolyMem and AG wicked to allow drainage through. We have been bringing her in for a nurse visit to change this. We do not have a lot of options for wound care products and the home again due to insurance issues 07/24/17; the patient's wound actually looks somewhat better today. No drainage measurements are smaller still healthy-looking surface. We used silver collagen under PolyMen started last week. We have been bringing her in for a dressing change 07/31/17; patient's wound surface continued to look better and I think there is visible change in the dimensions of the wound on the right. Rims of epithelialization. We have been using silver collagen under PolyMen and bringing her in for a dressing change. There appears to be less drainage although she is still in need of the dressing change 08/07/17. Patient's wound surface continues to look better on both sides and the area on the right is definitely smaller. We have been using silver collagen and PolyMen. She feels that the drainage has been it has been better. I asked her about her vascular status. She went to see Dr. Aleda Grana at [facility] and had some form of ablation. I don't have much detail on this. I haven't my notes from 2016 that she was not a candidate for any further ablation but I don't have any more  information on this. We had referred her to vein and vascular I don't think she ever went. He does not have a history of PAD although I don't have any information on this either. We don't even have ABIs in our record 08/14/17; we've been using silver collagen and PolyMen cover. And putting the patient and compression. She we are bringing her in as a nurse visit to change this because ofarge amount of drainage. We didn't the ABIs in clinic today since they had been done in many moons 1.2 bilaterally. She has been to see vein and vascular however this was at [facility] and she had ablation although I really don't have any information on this all seemed biking get a report. She is also been operatively debrided by plastic surgery and had a cell placed probably 8-12 months ago. This didn't have a major effect. We've been making some gains with current dressings 08/19/17-She is here in follow-up evaluation for bilateral  medial malleoli ulcers. She continues to tolerate debridement very poorly. We will continue with recently changed topical [name]; if no significant improvement may consider switching to Iodosorb/Iodoflex. 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 She has been using silver collagen and PolyMem. I believe she has been used and tried on Iodoflex before. 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 These have been very refractory over time. We have been using silver collagen and PolyMen. She is coming in once a week for a doctor's and once a week for nurse visits. We are actually making some progress 09/18/17; the patient's wounds are smaller especially on the right medial. She arrives today to upset to consider even washing these off with Anasept which  I think is been part of the reason this is been closing. We've been using collagen covered in PolyMen otherwise. It is noted that she has a small area of folliculitis on the right medial calf that. As we are wrapping her legs I'll give her a short course of doxycycline to make sure this doesn't amount to anything. She is a long list of complaints today including imbalance, shortness of breath on exertion, inversion of her left ankle. With regards to the latter complaints she is been to see orthopedics and they offered her a tendon release surgery I believe but wanted her wounds to be closed first. I have recommended she go see her primary physician with regards to everything else. 09/25/17; patient's wounds are about the same size. We have made some progress bilaterally although not in recent weeks. She will not allow me T wash these wounds with Anasept even if she is doing her cell. Wheeze we've been using collagen covered in PolyMen. Last week she had a small area of folliculitis this is now opened into a small wound. She completed 5 days of trimethoprim sulfamethoxazole 10/02/17; unfortunately the area on her left medial ankle is worse with a larger wound area towards the Achilles. The patient complains of a lot of pain. She will not allow debridement although visually I don't think there is anything to debridement in any case. We have been using silver collagen and PolyMen for several months now. Initially we are making some progress although I'm not really seeing that today. We will move back to [place]. His admittedly this is a bit of a repeat however I'm hoping that his situation is different now. The patient tells me she had her leg on the left give out on her yesterday this is process some pain. 10/09/17; the patient is seen twice a week largely because of drainage issues coming out of the chronic medial bimalleolar wounds that are chronic. Last week the dimensions of the one on the left  looks a little larger I changed her to [facility]. She comes in today with a history of terrible pain in the bilateral wound areas. She will not allow debridement. She will not even allow a tissue culture. There is no surrounding erythema no no evidence of cellulitis. We have been putting her Kerlix Coban man. She will not allow more aggressive compression as there was a suggestion to put her in 3 layer wraps. 10/16/17; large wounds on her bilateral medial malleoli. These are chronic. Not much change from last week. The surface looks  have healthy but absolutely no epithelialization. A lot of pain little less so of drainage. She will not allow debridement or even washing these off in the vigorous fashion with Anasept. 10/23/17; large wounds on her bilateral malleoli which are chronic. Some improvement in terms of size perhaps on the right since last time I saw these. She states that after we increased the 3 layer compression there was some bleeding, when she came in for a nurse visit she did not want 3 layer compression put back on about our nurse managed to convince her. She has known chronic venous visit issues and I'm hoping to get her to tolerate the 3 layer compression. using Hydrofera Blue 10/30/17; absolutely no change in the condition of either wound although we've had some improvement in dimensions on the right.. Attempted to put her in 3 layer compression she didn't tolerated she is back in 2 layer compression. We've been using Hydrofera Blue We looked over her past records. She had venous reflux studies in November 2016. There was no evidence of deep venous reflux on the right. Superficial vein did not show the greater saphenous vein at think this is been previously ablated the small saphenous vein was within normal limits. The left deep venous system showed no DVT the vessels were positive for deep venous reflux in the posterior tibial veins at the ankle. The greater saphenous vein was  surgically absent small saphenous vein was within normal limits. She went to vein and vascular at [facility]. I believe she had an ablation on the left greater saphenous vein. I'll update her reflux studies perhaps ever reviewed by vein and vascular. We've made absolutely no progress in these wounds. Will also try to read and TheraSkins through her insurance 11/06/17; W the patient apparently has a 2 week follow-up with vein and vascular I like him to review the whole issue with regards to her previous vascular workup by Dr. Aleda Grana. We've really made no progress on these wounds in many months. She arrives today with less viable looking surface on the left medial ankle wound. This was apparently looking about the same on Tuesday when she was here for nurse visit. 11/13/17; deep tissue culture I did last time of the left lower leg showed multiple organisms without any predominating. In particular no Staphylococcus or group A strep were isolated. We sent her for venous reflux studies. She's had a previous left greater saphenous vein stripping and I think sclerotherapy of the right greater saphenous vein. She didn't really look at the lesser saphenous vein this both wounds are on the medial aspect. She has reflux in the common femoral vein and popliteal vein and an accessory vein on the right and the common femoral vein and popliteal vein on the left. I'm going to have her go to see vein and vascular just the look over things and see if anything else beside aggressive compression is indicated here. We have not been able to make any progress on these wounds in spite of the fact that the surface of the wounds is never look too bad. 11/20/17; no major change in the condition of the wounds. Patient reports a large amount of drainage. She has a lot of complaints of pain although enlisting her today I wonder if some of this at least his neuropathic rather than secondary to her wounds. She has an  appointment with vein and vascular on 12/30/17. The refractory nature of these wounds in my mind at least need vein and vascular  to look over the wounds the recent reflux studies we did and her history to see if anything further can be done here. I also note her gait is deteriorated quite a bit. 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 She has a bilateral Trendelenburg gait. I wonder if this is neuropathic or perhaps multilevel radicular. 11/27/17; her wounds actually looks slightly better. Healthy-looking granulation tissue a scant amount of epithelialization. [place] healthcare will not pay for [employer]. They will play for tri layer Oasis and Dermagraft. This is not a diabetic ulcer. We'll try for the tri layer Oasis. She still complains of some drainage. She has a vein and vascular appointment on 12/30/17 12/04/17; the wounds visually look quite good. Healthy-looking granulation with some degree of epithelialization. We are still waiting for response to our request for trial to try layer Oasis. Her appointment with vascular to review venous and arterial issues isn't sold the end of July 7/31. Not allow debridement or even vigorous cleansing of the wound surface. 12/18/17; slightly smaller especially on the right. 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 We've been using Hydrofera Blue. We still are looking into triple layer Oasis through her insurance 01/08/18 on evaluation today patient's wound actually appears to be showing signs of good improvement at this point in time. She has been tolerating the dressing changes without complication. Fortunately there does not appear to be any evidence of infection at this point in time. We have been utilizing silver nitrate which does seem to be of benefit for her which is also good news. Overall I'm very happy with how things seem to be both regards appearance as well as measurement. Patient did see Dr. Bridgett Larsson for  evaluation on 12/30/17. In his assessment he felt that stripping would not likely add much more than chronic compression to the patient's healing process. His recommendation was to follow-up in three months with Dr. Doren Custard if she hasn't healed in order to consider referral back to you and see vascular where she previously was in a trial and was able to get her wound to heal. I'll be see what she feels she when you staying compression and he reiterated this as well. 01/13/18 on evaluation today patient appears to actually be doing very well in regard to her bilateral medial malleolus ulcers. She seems to have tolerated the chemical cauterization with silver nitrate last week she did have some pain through that evening but fortunately states that I'll be see since it seems to be doing better she is overall pleased with the progress. 01/21/18; really quite a remarkable improvement since I've last seen these wounds. We started using silver nitrate specially on the islands of hyper granulation which for some reason her around the wound circumference. This is really done quite nicely. Primary dressing Hydrofera Blue under 4 layer compression. She seems to be able to hold out without a nurse rewrap. Follow-up in 1 week 01/28/18; we've continued the hydrofera blue but continued with chemical cauterization to the wound area that we started about a month ago for irregular hyper granulation. She is made almost stunning improvement in the overall wound dimensions. I was not really expecting this degree of improvement in these chronic wounds 02/05/18; we continue with Hydrofera Bluebut of also continued the aggressive chemical cauterization with silver nitrate. We made nice progress with the right greater than left wound. 02/12/18. We continued with Hydrofera Blue after aggressive chemical cauterization with silver nitrate. We appear to be making nice  progress with both wound areas 02/19/2018; we continue with Hydrofera  Blue after washing the wounds vigorously with Anasept spray and chemical cauterization with silver nitrate. We are making excellent progress. The area on the right's just about closed 02/26/2018. The area on the left medial ankle had too much necrotic debris today. I used a #5 curette we are able to get most of the soft. I continued with the silver nitrate to the much smaller wound on the right medial ankle she had a new area on her right lower pretibial area which she says was due to a role in her compression 03/05/2018; both wound areas look healthy. Not much change in dimensions from last week. I continue to use silver nitrate and Hydrofera Blue. The patient saw Dr. Doren Custard of vein and vascular. He felt she had venous stasis ulcers. He felt based on her previous arteriogram she should have adequate circulation for healing. Also she has deep venous reflux but really no significant correctable superficial venous reflux at this time. He felt we should continue with conservative management including leg elevation and compression 04/02/2018; since we last saw this woman about a month ago she had a fall apparently suffered a pelvic fracture. I did not look up the x-ray. Nevertheless because of pain she literally was bedbound for 2 weeks and had home health coming out to change the dressing. Somewhat predictably this is resulted in considerable improvement in both wound areas. The right is just about closed on the medial malleolus and the left is about half the size. 04/16/2018; both her wounds continue to go down in size. Using Hydrofera Blue. 05/07/18; both her wounds appeared to be improving especially on the right where it is almost closed. We are using Hydrofera Blue 05/14/2018; slightly worse this week with larger wounds. Surface on the left medial not quite as good. We have been using Hydrofera Blue 05/21/18; again the wounds are slightly larger. Left medial malleolus slightly larger with eschar around  the circumference. We have been using Hydrofera Blue undergoing a wraps for a prolonged period of time. This got a lot better when she was more recumbent due to a fall and a back injury. I change the primary dressing the silver alginate today. She did not tolerate a 4 layer compression previously although I may need to bring this up with her next time 05/28/2018; area on the left medial malleolus again is slightly larger with more drainage. Area on the right is roughly unchanged. She has a small area of folliculitis on the right medial just on the lower calf. This does not look ominous. 06/03/2018 left medial malleolus slightly smaller in a better looking surface. We used silver nitrate on this last time with silver alginate. The area on the right appears slightly smaller 1/10; left medial malleolus slightly smaller. Small open area on the right. We used silver nitrate and silver alginate as of 2 weeks ago. We continue with the wound and compression. These got a lot better when she was off her feet 1/17; right medial malleolus wound is smaller. The left may be slightly smaller. Both surfaces look somewhat better. 1/24; both wounds are slightly smaller. Using silver alginate under Unna boots 1/31; both wounds appear smaller in fact the area on the right medial is just about closed. Surface eschar. We have been using silver alginate under Unna boots. The patient is less active now spends let much less time on her feet and I think this is contributed to the general  improvement in the wound condition 2/7; both wounds appear smaller. I was hopeful the right medial would be closed however there there is still the same small open area. Slight amount of surface eschar on the left the dimensions are smaller there is eschar but the wound edges appear to be free. We have been using silver alginate under Unna boot's 2/14; both wounds once again measure smaller. Circumferential eschar on the left medial. We have  been using silver alginate under Unna boots with gradual improvement 2/21; the area on the right medial malleolus has healed. The area on the left is smaller. We have been using silver alginate and Unna boots. We can discharge wrapping the right leg she has 20/30 stockings at home she will need to protect the scar tissue in this area 2/28; the area on the right medial malleolus remains closed the patient has a compression stocking. The area on the left is smaller. We have been using silver alginate and Unna boots. 3/6 the area on the right medial ankle remains closed. Good edema control noted she is using her own compression stocking. The area on the left medial ankle is smaller. We have been managing this with silver alginate and Unna boots which we will continue today. 3/13; the area on the right medial ankle remains closed and I'm declaring it healed today. When necessary the left is about the same still a healthy-looking surface but no major change and wound area. No evidence of infection and using silver alginate under unna and generally making considerable improvement 3/27 the area on the right medial ankle remains closed the area on the left is about the same as last week. Certainly not any worse we have been using silver alginate under an Unna boot 4/3; the area on the right medial ankle remains closed per the patient. We did not look at this wound. The wound on the left medial ankle is about the same surface looks healthy we have been using silver alginate under an Unna boot 4/10; area on the right medial ankle remains closed per the patient. We did not look at this wound. The wound on the left medial ankle is slightly larger. The patient complains that the [facility] caused burning pain all week. She also told us that she was a lot more active this week. Changed her back to silver alginate 4/17; right medial ankle still closed per the patient. Left medial ankle is slightly larger.  Using silver alginate. She did not tolerate Hydrofera Blue on this area 4/24; right medial ankle remains closed we have not look at this. The left medial ankle continues to get larger today by about a centimeter. We have been using silver alginate under Unna boots. She complains about 4 layer compression as an alternative. She has been up on her feet working on her garden 5/8; right medial ankle remains closed we did not look at this. The left medial ankle has increased in size about 100%. We have been using silver alginate under Unna boots. She noted increased pain this week and was not surprised that the wound is deteriorated 5/15; no major change in SA however much less erythema ( one week of doxy ocellulitis). 5/22-65 year old female returns at 1 week to the clinic for left medial ankle wound for which we have been using silver alginate under 3 layer compression She was placed on DOXY at last visit - the wound is wider at this visit. She is in 3 layer compression 5/29; change to  Hydrofera Blue last week. I had given her empiric doxycycline 2 weeks ago for a week. She is in 3 layer compression. She complains of a lot of pain and drainage on presentation today. 6/5; using Hydrofera Blue. I gave her doxycycline recently empirically for erythema and pain around the wound. Believe her cultures showed enterococcus which not would not have been well covered by doxycycline nevertheless the wound looks better and I don't feel specifically that the enterococcus needs to be covered. She has a new what looks like a wrap injury on her lateral left ankle. 6/12; she is using Hydrofera Blue. She has a new area on the left anterior lower tibial area. This was a wrap injury last week. 6/19; the patient is using Hydrofera Blue. She arrived with marked inflammation and erythema around the wound and tenderness. 12/01/18 on evaluation today patient appears to be doing a little bit better based on what I'm hearing from  the standpoint of lassos evaluation to this as far as the overall appearance of the wound is concerned. Then sometime substandard she typically sees Dr. Dellia Nims. Nonetheless overall very pleased with the progress that she's made up to this point. No fevers, chills, nausea, or vomiting noted at this time. 7/10; some improvement in the surface area. Aggressively debrided last week apparently. I went ahead with the debridement today although the patient does not tolerate this very well. We have been using Iodoflex. Still a fair amount of drainage 7/17; slightly smaller. Using Iodoflex. 7/24; no change from last week in terms of surface area. We have been using Iodoflex. Surface looks and continues to look somewhat better 7/31; surface area slightly smaller better looking surface. We have been using Iodoflex. This is under Unna boot compression 8/7-Patient presents at 1 week with Unna boot and Iodoflex, wound appears better 8/14-Patient presents at 1 week with Iodoflex, we use the Unna boot, wound appears to be stable better.Patient is getting Botox [name] for the inversion of the foot for tendon release, Next week 8/21; we are using Iodoflex. Unna boot. The wound is stable in terms of surface area. Under illumination there is some areas of the wound that appear to be either epithelialized or perhaps this is adherent slough at this point I was not really clear. It did not wipe off and I was reluctant to debride this today. 8/28; we are using Iodoflex in an Unna boot. Seems to be making good improvement. 9/4; using Iodoflex and wound is slightly smaller. 9/18; we are using Iodoflex with topical silver nitrate when she is here. The wound continues to be smaller 10/2; patient missed her appointment last week due to GI issues. She left and Iodoflex based dressing on for 2 weeks. Wound is about the same size about the size of a dime on the left medial lower 10/9 we have been using Iodoflex on the medial  left ankle wound. She has a new superficial probable wrap injury on the dorsal left ankle 10/16; we have been using Hydrofera Blue since last week. This is on the left medial ankle 10/23; we have been using Hydrofera Blue since 2 weeks ago. This is on the left medial ankle. Dimensions are better 11/6; using Hydrofera Blue. I think the wound is smaller but still not closed. Left medial ankle 11/13; we have been using Hydrofera Blue. Wound is certainly no smaller this week. Also the surface not as good. This is the remanent of a very large area on her left medial ankle. 11/20; using  Sorbact since last week. Wound was about the same in terms of size although I was disappointed about the surface debris 12/11; 3-week follow-up. Patient was on vacation. Wound is measuring slightly larger we have been using Sorbact. 12/18; wound is about the same size however surface looks better last week after debridement. We have been using Sorbact under compression 1/15 wound is probably twice the size of last time increased in length nonviable surface. We have been using Sorbact. She was running a mild fever and missed her appointment last week 1/22; the wound is come down in size but under illumination still a very adherent debris we have been Hydrofera Blue that I changed her to last week 1/29; dimensions down slightly. We have been using Hydrofera Blue 2/19 dimensions are the same however there is rims of epithelialization under illumination. Therefore more the surface area may be epithelialized 2/26; the patient's wound actually measures smaller. The wound looks healthy. We have been using Hydrofera Blue. I had some thoughts about running Apligraf then I still may do that however this looks so much better this week we will delay that for now 3/5; the wound is small but about the same as last week. We have been using Hydrofera Blue. No debridement is required today. 3/19; the wound is about the size of a dime.  Healthy looking wound even under illumination. We have been using Hydrofera Blue. No mechanical debridement is necessary 3/26; not much change from last week although still looks very healthy. We have been using Hydrofera Blue under Unna boots Patient was offered an ankle fusion by podiatry but not until the wound heals with a proceed with this. 4/9; the patient comes in today with her original wound on the medial ankle looking satisfactory however she has some uncontrolled swelling in the middle part of her leg with 2 new open areas superiorly just lateral to the tibia. I think this was probably a wrap issue. She said she felt uncomfortable during the week but did not call in. We have been using Hydrofera Blue 4/16; the wound on the medial ankle is about the same. She has innumerable small areas superior to this across her mid tibia. I think this is probably folliculitis. She is also been working in the yard doing a lot of sweating 4/30; the patient issue on the upper areas across her mid tibia of all healed. I think this was excessive yard work if I remember. Her wound on the medial ankle is smaller. Some debris on this we have been using Hydrofera Blue under Unna boots 5/7; mid tibia. She has been using Hydrofera Blue under an Unna wrap. She is apparently going for her ankle surgery on June 3 10/28/19-Patient returns to clinic with the ankle wound, we are using Hydrofera Blue under Unna wrap, surgery is scheduled for her left foot for June 3 so she will be back for nurse visit next week READMISSION 01/17/2020 [name] is a 65 year old woman we have had in this clinic for a long period of time with severe venous hypertension and refractory wounds on her medial lower legs and ankles bilaterally. 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 When she was off her legs for a  prolonged period example when she fell and suffered a compression fracture things would heal up quite nicely. She is now retired and we managed  to heal up the right medial leg wound. The left one was very tiny last time I saw this although still refractory. 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 It got to the point where this was interfering with her walking and she elected to proceed with a ankle arthrodesis to straighten her her ankle and leave her with a functional outcome for mobilization. The patient was referred to Dr. Doren Custard and really this took some time to arrange. Dr. Doren Custard saw her on 12/07/2019. Once again he verified that she had no arterial issues. She had previously had an angiogram several years ago. Follow-up ABIs on the left showed an ABI of 1.12 with triphasic waveforms and a TBI of 0.92. She is felt to have chronic deep venous insufficiency but I do not think it was felt that anything could be done from about this from an ablation point of view. At the time Dr. Doren Custard saw this patient the wounds actually look closed via the pictures in his clinic. The patient finally underwent her surgery on 12/15/2019. This went reasonably well and there was a good anatomic outcome. She developed a small distal wound dehiscence on the lateral part of the surgical wound. However more problematically she is developed recurrence of the wound on the medial left ankle. There are actually 2 wounds here one in the distal lower leg and 1 pretty much at the level of the medial malleolus. It is a more distal area that is more problematic. She has been using Hydrofera Blue which started on Friday before this she was simply Ace wrapping. There was a culture done that showed Pseudomonas and she is on ciprofloxacin. A recent CNS on 8/11 was negative. The patient reports some pain but I generally think this is improving. She is  using a cam boot completely nonweightbearing using a walker for pivot transfers and a wheelchair 8/24; not much improvement unfortunately she has a surgical wound on the lateral part in the venous insufficiency wound medially. The bottom part of the medial insufficiency wound is still necrotic there is exposed tendon here. We have been using Hydrofera Blue under compression. Her edema control is however better 8/31; patient in for follow-up of his surgical wound on the lateral part of her left leg and chronic venous insufficiency ulcers medially. We put her back in compression last week. She comes in today with a complaint of 3 or 4 days worth of increasing pain. She felt her cam walker was rubbing on the area on the back of her heel. However there is intense erythema seems more likely she has cellulitis. She had 2 cultures done when she was seeing podiatry in the postop. One of them in late July showed Pseudomonas and she received a course of ciprofloxacin the other was negative on 8/11 she is allergic to penicillin with anaphylactoid complaints of hives oral swelling via information in epic 9/9; when I saw this patient last week she had intense anterior erythema around her wound on the right lateral heel and ankle and also into the right medial heel. Some of this was no doubt drainage and her walker boot however I was convinced she had cellulitis. I gave her Levaquin and Bactrim she is finishing up on this now. She is following up with Dr. [name] he saw her yesterday. He is taken her out of the walking boot of course she is still nonweightbearing. Her x-ray was negative  for any worrisome features such as soft tissue air etc. Things are a lot better this week. She has home health. We have been using Hydrofera Blue under an [employer] which she put back on yesterday. I did not wrap her last week 9/17; her surrounding skin looks a lot better. In fact the area on the left lateral ankle has just a scant  amount of eschar. The only remaining wound is the large area on the left medial ankle. Probably about 60% of this is healthy granulation at the surface however she has a significant divot distally. This has adherent debris in it. I been using debridement and silver collagen to try and get this area to fill-in although I do not think we have made much progress this week 9/24; the patient's wound on the left medial ankle looks a lot better. The deeper divot area distally still requires debridement but this is cleaning up quite nicely we have been using silver collagen. The patient is complaining of swelling in her foot and is worried that that is contributing to the nonhealing of the ankle wound. She is also complaining of numbness in her anterior toes 10/4; left medial ankle. The small area distally still has a divot with necrotic material that I have been debriding away. This has an undermining area. She is approved for Apligraf. She saw Dr. [name] her surgeon on 10/1. I think he declared himself is satisfied with the condition of things. Still nonweightbearing till the end of the month. 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 Her surgical wound is well closed. There is no evidence of infection 10/11; the patient arrived in clinic today with the expectation that we be able to put an Apligraf on this area after debridement however she arrives with a relatively large amount of green drainage on the dressing. The patient states that this started on Friday. She has not been systemically unwell. 10/19; culture I did last week showed both Enterococcus and Pseudomonas. I think this came in separate parts because I stopped her ciprofloxacin I gave her and prescribed her linezolid however now looking at the final culture result this is Pseudomonas which is resistant to quinolones. She has not yet picked up the linezolid apparently phone issues. We are also trying to get a  topical antibiotic out of [facility] in [place] they can be applied by home health. She is still having green drainage 10/16; the patient has her topical antibiotic from [facility] in [place]. This is a compounded gel with vancomycin and ciprofloxacin and gentamicin. We are applying this on the wound bed with silver alginate over the top with Unna boot wraps. She arrives in clinic today with a lot less ominous looking drainage although she is only use this topical preparation once the second time today. She sees Dr. [name] her surgeon on Friday she has home health changing the dressing 11/2; still using her compounded topical antibiotic under silver alginate. Surface is cleaning up there is less drainage. We had an Apligraf for her today and I elected to apply it. A light coating of her antibiotic 04/25/2020 upon evaluation today patient appears to be doing well with regard to her ankle ulcer. There is a little bit of slough noted on the surface of the wound I am can have to perform sharp debridement to clear this away today. With that being said other than that fact overall I feel like she is making progress and we do  see some new epithelial growth. There is also some improvement in the depth of the wound and that distal portion. There is little bit of slough there as well. 12/7; 2-week follow-up. Apligraf #3. Dimensions are smaller. Closing in especially inferiorly. Still some surface debris. Still using the [facility] topical antibiotic but I told her that I don't think this needs to be renewed 12/21; 2-week follow-up. Apligraf #4 dimensions are smaller. Nice improvement 06/05/2020; 2-week follow-up. The patient's wound on the left medial ankle looks really excellent. Nice granulation. Advancing epithelialization no undermining no evidence of infection. We would have to reapply for another Apligraf but with the condition of this wound I did not feel strongly about it. We used  Hydrofera Blue under the same degree of compression. She follows up with Dr. [name] her surgeon a week Friday 06/13/2020 upon evaluation today patient appears to be doing excellent in regard to her wound. She has been tolerating the dressing changes without complication. Fortunately there is no signs of active infection at this time. No fevers, chills, nausea, vomiting, or diarrhea. She was using Hydrofera Blue last week. 06/20/2020 06/20/2020 on evaluation today patient actually appears to be doing excellent in regard to her wound. This is measuring better and looking much better as well. She has been using the collagen that seems to be doing better for her as well even though the [facility] was and is not sticking or feeling as rough on her wound. She did see Dr. [name] on Friday he is very pleased he also stated none of the hardware has shifted. That is great news 1/27; the patient has a small clean wound all that is remaining. I agree that this is too small to really consider an Apligraf. Under illumination the surface is looking quite good. We have been using collagen although the dimensions are not any better this week 2/2; the patient has a small clean wound on the left medial ankle. Although this left of her substantial original areas. Measurements are smaller. We have been using polymen Ag under an Haematologist. 2/10; small area on the left medial ankle. This looks clean nothing to debride however dimensions are about the same we have been using polymen I think now for 2 weeks 2/17; not much change in surface area. We have been using polymen Ag without any improvement. 3/17; 1 month follow-up. The patient has been using endoform without any improvement in fact I think this looks worse with more depth and more expansion 3/24; no improvement. Perhaps less debris on the surface. We have been using Sorbact for 1 week 4/4; wound measures larger. She has edema in her leg and her foot which she tells  as her wrap came down. We have been using Unna boots. Sorbact of the wound. She has been approved for Apligraf 09/12/2020 upon evaluation today patient appears to be doing well with regard to her wound. We did get the Apligraf reapproved which is great news we have that available for application today. Fortunately there is no signs of infection and overall the patient seems to be doing great. The wound bed is nice and clean. 4/27; patient presents for her second application of Apligraf. She states over the past week she has been on her feet more often due to being outside in her garden. She has noted more swelling to her foot as a result. She denies increased warmth, pain or erythema to the wound site. 10/10/2020 upon evaluation today patient appears to be doing well  with regard to her wound which does not appear to be quite as irritated as last week from what I am hearing. With that being said unfortunately she is having issues with some erythema and warmth to touch as well as an increase size. I do believe this likely is infected. 10/17/2020 upon evaluation today patient appears to be doing excellent in regard to her wound this is significantly improved as compared to last week. Fortunately I think that the infection is much better controlled at this point. She did have evidence of both Enterococcus as well as Staphylococcus noted on culture. Enterococcus really would not be helped significantly by the Cipro but the wound is doing so much better I am under the assumption that the Staphylococcus is probably the main organism that is causing the current infection. Nonetheless I think that she is doing excellent as far as that is concerned and I am very pleased in that regard. I would therefore recommend she continue with the Cipro. 10/31/2020 upon evaluation today patient appears to be doing well with regard to her wound. She has been tolerating the dressing changes without complication. Fortunately there  is no signs of active infection and overall I am extremely pleased with where things stand today. No fevers, chills, nausea, vomiting, or diarrhea. With that being said she does have some green drainage coming from the wound and although it looks okay I am a little concerned about the possibility of a continuing infection. Specifically with Pseudomonas. For that reason I will go ahead and send in a prescription for Cipro for her to be continued. 11/14/2020 upon evaluation today patient appears to be doing very well currently in regard to her wound on her leg. She has been tolerating the dressing changes without complication. Fortunately I feel like the infection is finally under good control here. Unfortunately we do not have the Apligraf for application today although we can definitely order to have it in place for next week. That will be her fifth and final of the current series. Nonetheless I feel like her wound is really doing quite well which is great news. 11/21/2020 upon evaluation today patient appears to be doing well with regard to her wound on the medial ankle. Fortunately I think the infection is under control and I do believe we can go ahead and reapply the Apligraf today. She is in agreement with that plan. There does not appear to be any signs of active infection at this time which is great news. No fevers, chills, nausea, vomiting, or diarrhea. 12/05/2020 upon evaluation today patient's wound bed actually showed signs of good granulation epithelization at this point. There does not appear to be any signs of infection which is great news and overall very pleased with where things stand. Overall the patient seems to be doing fairly well in my opinion with regard to her wound although I do believe she continues to build up a lot of biofilm I think she could benefit from using PuraPly at this point. 12/12/2020 upon evaluation today patient's wound actually appears to be doing decently well today.  The Unna boot has not been quite as well-tolerated so that more uncomfortable for her and even causing some pressure over the plate on the lateral portion of her foot which is 90 where the wound is. There did not appear to be any significant deep tissue injury with that there may be a minimal change in the skin noted I think that we may want to go back  to the Coflex 2 layer which is a little bit easier on her skin it seems. 12/19/2020 upon evaluation today patient actually seems to be making great progress with the PuraPly currently. She in fact seems to be much better as far as the overall appearance of the wound bed is concerned I am very happy in this regard. I do not see any signs of of infection which is great news as well. No fevers, chills, nausea, vomiting, or diarrhea. 12/26/2020 upon evaluation today patient appears to actually be doing better in regard to her wound on the left medial ankle region. The surface of the wound is actually doing significantly better which is great news. There does not appear to be any signs of infection which is also great news and in general I am extremely pleased with where we stand today. Objective Constitutional Well-nourished and well-hydrated in no acute distress. Vitals Time Taken: 11:14 AM, Height: 68 in, Temperature: 98.1 F, Pulse: 65 bpm, Respiratory Rate: 16 breaths/min, Blood Pressure: 129/67 mmHg. Respiratory normal breathing without difficulty. Psychiatric this patient is able to make decisions and demonstrates good insight into disease process. Alert and Oriented x 3. pleasant and cooperative. General Notes: Patient's wound bed showed signs of good granulation epithelization at this point. There does not appear to be any signs of infection which is great and overall I am extremely pleased with where the patient stands today. No fevers, chills, nausea, vomiting, or diarrhea. Integumentary (Hair, Skin) Wound #15 status is Open. Original cause  of wound was Gradually Appeared. The date acquired was: 12/30/2019. The wound has been in [name] 49 weeks. The wound is located on the Left,Medial Malleolus. The wound measures 3.4cm length x 2.6cm width x 0.1cm depth; 6.943cm^2 area and 0.694cm^3 volume. There is Fat Layer (Subcutaneous Tissue) exposed. There is no tunneling or undermining noted. There is a medium amount of serosanguineous drainage noted. The wound margin is distinct with the outline attached to the wound base. There is large (67-100%) red, pink granulation within the wound bed. There is a small (1-33%) amount of necrotic tissue within the wound bed including Adherent Slough. 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 A skin graft procedure using a bioengineered skin substitute/cellular or tissue based product was performed by [name] Keeler, PA with the following instrument(s): Forceps and Scissors. Puraply AM was applied and was not secured. 12 sq cm of product was utilized and 0 sq cm was wasted. Post Application, adaptic, gauze was applied. A Time Out was conducted at 11:40, prior to the start of the procedure. The procedure was tolerated well with a pain level of 0 throughout and a pain level of 0 following the procedure. Post procedure Diagnosis Wound #15: Same as Pre-Procedure . Pre-procedure diagnosis of Wound #15 is a Venous Leg Ulcer located on the Left,Medial Malleolus . There was a Double Layer Compression Therapy Procedure by [name] Gouty, RN. Post procedure Diagnosis Wound #15: Same as Pre-Procedure Plan Follow-up Appointments: Return Appointment in  1 week. - with Margarita Grizzle - Puraply Cellular or Tissue Based Products: Cellular or Tissue Based Product Type: - PuraPly: #3 Bathing/ Shower/ Hygiene: May shower with protection but do not get wound dressing(s) wet. Edema  Control - Lymphedema / SCD / Other: Elevate legs to the level of the heart or above for 30 minutes daily and/or when sitting, a frequency of: - throughout the day Avoid standing for long periods of time. Exercise regularly Compression stocking or Garment 20-30 mm/Hg pressure to: - right leg daily Additional Orders / Instructions: Follow Nutritious Diet WOUND #15: - Malleolus Wound Laterality: Left, Medial Cleanser: Soap and Water 1 x Per Week/30 Days Discharge Instructions: May shower and wash wound with dial antibacterial soap and water prior to dressing change. Peri-Wound Care: Triamcinolone 15 (g) 1 x Per Week/30 Days Discharge Instructions: Use triamcinolone 15 (g) mixed with lotion Peri-Wound Care: Sween Lotion (Moisturizing lotion) 1 x Per Week/30 Days Discharge Instructions: Apply moisturizing lotion as directed Secondary Dressing: Woven Gauze Sponge, Non-Sterile 4x4 in 1 x Per Week/30 Days Discharge Instructions: Apply over primary dressing as directed. Secondary Dressing: ABD Pad, 5x9 1 x Per Week/30 Days Discharge Instructions: Apply over primary dressing and lateral ankle Secondary Dressing: Optifoam Non-Adhesive Dressing, 4x4 in 1 x Per Week/30 Days Discharge Instructions: Apply over primary dressing pad lateral foot with foam donut Secondary Dressing: ADAPTIC TOUCH 3x4.25 in 1 x Per Week/30 Days Discharge Instructions: Apply over skin sub Com pression Wrap: CoFlex TLC XL 2-layer Compression System 4x7 (in/yd) 1 x Per Week/30 Days Discharge Instructions: Apply CoFlex 2-layer compression as directed. (alt for 4 layer) 1. I would recommend currently that we going continue with the wound care measures as before and the patient is in agreement with the plan. This  includes the use of the PuraPly which I think is doing well this is application #3. The surface of the wound seems to be dramatically improved. 2. I am also can recommend at this time that we have the patient continue with the Coflex 2 layer compression wrap which I feel like is also doing a good job here. 3. I am also can recommend patient continue to elevate her legs is much as possible to try to help keep edema under good control. We will see patient back for reevaluation in 1 week here in the clinic. If anything worsens or changes patient will contact our office for additional recommendations. Electronic Signature(s) Signed: 12/26/2020 11:54:58 AM By: [name] Keeler PA-C Entered By: [name] on 12/26/2020 11:54:58 -------------------------------------------------------------------------------- SuperBill Details Patient Name: Date of Service: [facility] [name], [name] 12/26/2020 Medical Record Number: [id number] Patient Account Number: [id number] Date of Birth/Sex: Treating RN: Dec 31, 1955 (65 y.o. [name]: [name] Other Clinician: Referring [name]: Treating [name]/Extender: [name]: 52 Diagnosis Coding ICD-10 Codes Code Description [phone] Chronic venous hypertension (idiopathic) with ulcer and inflammation of left lower extremity L97.828 Non-pressure chronic ulcer of other part of left lower leg with other specified severity L97.328 Non-pressure chronic ulcer of left ankle with other specified severity Facility Procedures CPT4 Code: [id number] Description: [id number] PuraPly AM 3X4 (12sq. cm) Modifier: Quantity: 12 CPT4 Code: [id number] Description: [id number] - SKIN SUB GRAFT TRNK/ARM/LEG ICD-10 Diagnosis Description L97.828 Non-pressure chronic ulcer of other part of left lower leg with other specified s Modifier: everity Quantity: 1 Physician Procedures : CPT4 Code Description Modifier [id number] -  WC PHYS SKIN SUB GRAFT TRNK/ARM/LEG ICD-10 Diagnosis Description L97.828 Non-pressure chronic ulcer of other part of left lower leg with other specified severity Quantity: 1 Electronic Signature(s) Signed: 12/26/2020 11:55:14 AM By: [name] Keeler PA-C Entered By: [name] on 12/26/2020 11:55:14

## 2020-12-26 NOTE — Progress Notes (Signed)
CILLA, BOBBITT ([id number]) Visit Report for 11/28/2020 SuperBill Details Patient Name: Date of Service: [facility], [name] 11/28/2020 Medical Record Number: [id number] Patient Account Number: [id number] Date of Birth/Sex: Treating RN: 07/05/1955 (65 y.o. Nita Sells Primary Care Provider: [name] Other Clinician: Referring Provider: Treating Provider/Extender: [name] in Treatment: 45 Diagnosis Coding ICD-10 Codes Code Description [phone] Chronic venous hypertension (idiopathic) with ulcer and inflammation of left lower extremity L97.828 Non-pressure chronic ulcer of other part of left lower leg with other specified severity L97.328 Non-pressure chronic ulcer of left ankle with other specified severity Facility Procedures CPT4 Code Description Modifier Quantity [id number] - WOUND CARE VISIT-LEV 2 EST PT 1 Electronic Signature(s) Signed: 12/04/2020 6:17:43 PM By: Leane Call Signed: 12/26/2020 8:49:57 AM By: [name] PA-C Entered By: Leane Call on 11/28/2020 16:53:13

## 2020-12-31 NOTE — Progress Notes (Signed)
[name], [name] ([id number]) Visit Report for 12/26/2020 Arrival Information Details Patient Name: Date of Service: [name], [name] 12/26/2020 10:45 A M Medical Record Number: [id number] Patient Account Number: [id number] Date of Birth/Sex: Treating RN: 08-01-1955 (65 y.o. [name] Loula Marcella: [name] Other Clinician: Referring Anisia Leija: Treating Audree Schrecengost/Extender: [name] in Treatment: 22 Visit Information History Since Last Visit Added or deleted any medications: No Patient Arrived: Cane Any new allergies or adverse reactions: No Arrival Time: 11:13 Had a fall or experienced change in No Accompanied By: self activities of daily living that may affect Transfer Assistance: None risk of falls: Patient Identification Verified: Yes Signs or symptoms of abuse/neglect since last visito No Secondary Verification Process Completed: Yes Hospitalized since last visit: No Patient Requires Transmission-Based Precautions: No Implantable device outside of the clinic excluding No Patient Has Alerts: Yes cellular tissue based products placed in the center Patient Alerts: L ABI =1.12, TBI = .92 since last visit: R ABI= 1.02, TBI= .58 Has Dressing in Place as Prescribed: Yes Pain Present Now: No Electronic Signature(s) Signed: 12/31/2020 1:33:39 PM By: [name] Entered By: [name] on 12/26/2020 11:14:13 -------------------------------------------------------------------------------- Compression Therapy Details Patient Name: Date of Service: [facility] [name], [name] Kingfisher G. 12/26/2020 10:45 A M Medical Record Number: [id number] Patient Account Number: [id number] Date of Birth/Sex: Treating RN: 1955/09/12 (65 y.o. [name] Chestine Belknap: [name] Other Clinician: Referring Cassadee Vanzandt: Treating Kol Consuegra/Extender: [name] in Treatment: 59 Compression Therapy Performed for Wound  Assessment: Wound #15 Left,Medial Malleolus Performed By: Clinician Baruch Gouty, RN Compression Type: Double Layer Post Procedure Diagnosis Same as Pre-procedure Electronic Signature(s) Signed: 12/26/2020 5:32:13 PM By: Baruch Gouty RN, BSN Entered By: Baruch Gouty on 12/26/2020 11:41:27 -------------------------------------------------------------------------------- Encounter Discharge Information Details Patient Name: Date of Service: [name], [name] 12/26/2020 10:45 A M Medical Record Number: [id number] Patient Account Number: [id number] Date of Birth/Sex: Treating RN: 1955/12/23 (65 y.o. [name] Prabhjot Maddux: [name] Other Clinician: Referring Lennart Gladish: Treating Anu Stagner/Extender: [name] in Treatment: 26 Encounter Discharge Information Items Post Procedure Vitals Discharge Condition: Stable Temperature (F): 98.1 Ambulatory Status: Cane Pulse (bpm): 65 Discharge Destination: Home Respiratory Rate (breaths/min): 18 Transportation: Private Auto Blood Pressure (mmHg): 129/67 Accompanied By: self Schedule Follow-up Appointment: Yes Clinical Summary of Care: Electronic Signature(s) Signed: 12/26/2020 5:32:13 PM By: Baruch Gouty RN, BSN Entered By: Baruch Gouty on 12/26/2020 11:56:55 -------------------------------------------------------------------------------- Lower Extremity Assessment Details Patient Name: Date of Service: [facility] [name], [name] 12/26/2020 10:45 A M Medical Record Number: [id number] Patient Account Number: [id number] Date of Birth/Sex: Treating RN: [birth year] (65 y.o. [name] Seini Lannom: [name] Other Clinician: Referring Silvia Markuson: Treating Duquan Gillooly/Extender: [name] in Treatment: 61 Edema Assessment Assessed: [Left: No] [Right: No] Edema: [Left: Ye] [Right: s] Calf Left: Right: Point of Measurement: 31 cm From Medial  Instep 28 cm Ankle Left: Right: Point of Measurement: 11 cm From Medial Instep 22 cm Vascular Assessment Pulses: Dorsalis Pedis Palpable: [Left:Yes] Electronic Signature(s) Signed: 12/26/2020 5:32:13 PM By: Baruch Gouty RN, BSN Entered By: Baruch Gouty on 12/26/2020 11:26:02 -------------------------------------------------------------------------------- Multi-Disciplinary Care Plan Details Patient Name: Date of Service: [name], [name] 12/26/2020 10:45 A M Medical Record Number: [id number] Patient Account Number: [id number] Date of Birth/Sex: Treating RN: [birth year] (65 y.o. [name] Allaya Abbasi: [name] Other Clinician: Referring Fawn Desrocher:  Treating Tyiesha Brackney/Extender: [name] Weeks in Treatment: [place] reviewed with physician Active Inactive Venous Leg Ulcer Nursing Diagnoses: Actual venous Insuffiency (use after diagnosis is confirmed) Knowledge deficit related to disease process and management Goals: Patient will maintain optimal edema control Date Initiated: 06/20/2020 Target Resolution Date: 01/02/2021 Goal Status: Active Interventions: Assess peripheral edema status every visit. Compression as ordered Treatment Activities: Therapeutic compression applied : 06/20/2020 Notes: Wound/Skin Impairment Nursing Diagnoses: Impaired tissue integrity Knowledge deficit related to ulceration/compromised skin integrity Goals: Patient/caregiver will verbalize understanding of skin care regimen Date Initiated: 01/17/2020 Target Resolution Date: 01/02/2021 Goal Status: Active Ulcer/skin breakdown will have a volume reduction of 30% by week 4 Date Initiated: 01/17/2020 Date Inactivated: 03/12/2020 Target Resolution Date: 03/09/2020 Goal Status: Met Interventions: Assess patient/caregiver ability to obtain necessary supplies Assess patient/caregiver ability to perform ulcer/skin care regimen upon  admission and as needed Assess ulceration(s) every visit Provide education on ulcer and skin care Notes: Electronic Signature(s) Signed: 12/26/2020 5:32:13 PM By: Baruch Gouty RN, BSN Entered By: Baruch Gouty on 12/26/2020 11:27:54 -------------------------------------------------------------------------------- Pain Assessment Details Patient Name: Date of Service: [name], [name] Kingfisher G. 12/26/2020 10:45 A M Medical Record Number: [id number] Patient Account Number: [id number] Date of Birth/Sex: Treating RN: [birth year] (65 y.o. [name] Ajanae Virag: [name] Other Clinician: Referring Johnedward Brodrick: Treating Jobe Mutch/Extender: [name] in Treatment: 36 Active Problems Location of Pain Severity and Description of Pain Patient Has Paino No Site Locations Pain Management and Medication Current Pain Management: Electronic Signature(s) Signed: 12/26/2020 5:32:13 PM By: Baruch Gouty RN, BSN Signed: 12/31/2020 1:33:39 PM By: [name] Entered By: [name] on 12/26/2020 11:15:19 -------------------------------------------------------------------------------- Patient/Caregiver Education Details Patient Name: Date of Service: [name], [name] 7/27/2022andnbsp10:45 A M Medical Record Number: [id number] Patient Account Number: [id number] Date of Birth/Gender: Treating RN: 24-Aug-1955 (65 y.o. [name] Physician: [name] Other Clinician: Referring Physician: Treating Physician/Extender: [name] in Treatment: 39 Education Assessment Education Provided To: Patient Education Topics Provided Venous: Methods: Explain/Verbal Responses: Reinforcements needed, State content correctly Wound/Skin Impairment: Methods: Explain/Verbal Responses: Reinforcements needed, State content correctly Electronic Signature(s) Signed: 12/26/2020 5:32:13 PM By: Baruch Gouty  RN, BSN Entered By: Baruch Gouty on 12/26/2020 11:28:17 -------------------------------------------------------------------------------- Wound Assessment Details Patient Name: Date of Service: [name], [name] 12/26/2020 10:45 A M Medical Record Number: [id number] Patient Account Number: [id number] Date of Birth/Sex: Treating RN: 27-Aug-1955 (65 y.o. [name] Shanae Luo: [name] Other Clinician: Referring Laneah Luft: Treating Jordyne Poehlman/Extender: [name] in Treatment: 19 Wound Status Wound Number: 15 Primary Venous Leg Ulcer Etiology: Wound Location: Left, Medial Malleolus Wound Status: Open Wounding Event: Gradually Appeared Comorbid Peripheral Venous Disease, Osteoarthritis, Confinement Date Acquired: 12/30/2019 History: Anxiety Weeks Of Treatment: 49 Clustered Wound: No Photos Wound Measurements Length: (cm) 3.4 Width: (cm) 2.6 Depth: (cm) 0.1 Area: (cm) 6.943 Volume: (cm) 0.694 % Reduction in Area: 40.5% % Reduction in Volume: 92.6% Epithelialization: Small (1-33%) Tunneling: No Undermining: No Wound Description Classification: Full Thickness With Exposed Support Structures Wound Margin: Distinct, outline attached Exudate Amount: Medium Exudate Type: Serosanguineous Exudate Color: red, brown Foul Odor After Cleansing: No Slough/Fibrino Yes Wound Bed Granulation Amount: Large (67-100%) Exposed Structure Granulation Quality: Red, Pink Fascia Exposed: No Necrotic Amount: Small (1-33%) Fat Layer (Subcutaneous Tissue) Exposed: Yes Necrotic Quality: Adherent Slough Tendon Exposed: No Muscle Exposed: No Joint Exposed: No Bone Exposed: No  Treatment Notes Wound #15 (Malleolus) Wound Laterality: Left, Medial Cleanser Soap and Water Discharge Instruction: May shower and wash wound with dial antibacterial soap and water prior to dressing change. Peri-Wound Care Triamcinolone 15 (g) Discharge Instruction:  Use triamcinolone 15 (g) mixed with lotion Sween Lotion (Moisturizing lotion) Discharge Instruction: Apply moisturizing lotion as directed Topical Primary Dressing Secondary Dressing Woven Gauze Sponge, Non-Sterile 4x4 in Discharge Instruction: Apply over primary dressing as directed. ABD Pad, 5x9 Discharge Instruction: Apply over primary dressing and lateral ankle Optifoam Non-Adhesive Dressing, 4x4 in Discharge Instruction: Apply over primary dressing pad lateral foot with foam donut ADAPTIC TOUCH 3x4.25 in Discharge Instruction: Apply over skin sub Secured With Compression Wrap CoFlex TLC XL 2-layer Compression System 4x7 (in/yd) Discharge Instruction: Apply CoFlex 2-layer compression as directed. (alt for 4 layer) Compression Stockings Add-Ons Electronic Signature(s) Signed: 12/26/2020 5:32:13 PM By: Baruch Gouty RN, BSN Entered By: Baruch Gouty on 12/26/2020 11:27:17 -------------------------------------------------------------------------------- Vitals Details Patient Name: Date of Service: [name], [name] 12/26/2020 10:45 A M Medical Record Number: [id number] Patient Account Number: [id number] Date of Birth/Sex: Treating RN: [birth year] (65 y.o. [name] Godric Lavell: [name] Other Clinician: Referring Crystalmarie Yasin: Treating Tytianna Greenley/Extender: [name] in Treatment: 82 Vital Signs Time Taken: 11:14 Temperature (F): 98.1 Height (in): 68 Pulse (bpm): 65 Respiratory Rate (breaths/min): 16 Blood Pressure (mmHg): 129/67 Reference Range: 80 - 120 mg / dl Electronic Signature(s) Signed: 12/31/2020 1:33:39 PM By: [name] Entered By: [name] on 12/26/2020 11:15:11

## 2021-01-02 ENCOUNTER — Other Ambulatory Visit: Payer: Self-pay

## 2021-01-02 ENCOUNTER — Encounter (HOSPITAL_BASED_OUTPATIENT_CLINIC_OR_DEPARTMENT_OTHER): Payer: Medicare Other | Attending: Physician Assistant | Admitting: Physician Assistant

## 2021-01-02 DIAGNOSIS — Z880 Allergy status to penicillin: Secondary | ICD-10-CM | POA: Insufficient documentation

## 2021-01-02 DIAGNOSIS — Z8673 Personal history of transient ischemic attack (TIA), and cerebral infarction without residual deficits: Secondary | ICD-10-CM | POA: Diagnosis not present

## 2021-01-02 DIAGNOSIS — L97828 Non-pressure chronic ulcer of other part of left lower leg with other specified severity: Secondary | ICD-10-CM | POA: Insufficient documentation

## 2021-01-02 DIAGNOSIS — I87332 Chronic venous hypertension (idiopathic) with ulcer and inflammation of left lower extremity: Secondary | ICD-10-CM | POA: Insufficient documentation

## 2021-01-02 DIAGNOSIS — L97328 Non-pressure chronic ulcer of left ankle with other specified severity: Secondary | ICD-10-CM | POA: Diagnosis not present

## 2021-01-02 DIAGNOSIS — Z7401 Bed confinement status: Secondary | ICD-10-CM | POA: Diagnosis not present

## 2021-01-02 NOTE — Progress Notes (Addendum)
[name], [name] ([id number]) Visit Report for 01/02/2021 Chief Complaint Document Details Patient Name: Date of Service: [name], [name] 01/02/2021 12:45 PM Medical Record Number: [id number] Patient Account Number: [id number] Date of Birth/Sex: Treating RN: August 15, 1955 (65 y.o. [name] Provider: [name] Other Clinician: Referring Provider: Treating Provider/Extender: [name] in Treatment: 27 Information Obtained from: Patient Chief Complaint patient is been followed long-term in this clinic for venous insufficiency ulcers with inflammation, hypertension and ulceration over the medial ankle bilaterally. 01/17/2020; this is a patient who is here for review of postoperative wounds on the left lateral ankle and recurrence of venous stasis ulceration on the left medial Electronic Signature(s) Signed: 01/02/2021 1:16:15 PM By: [name] PA-C Entered By: [name] on 01/02/2021 13:16:15 -------------------------------------------------------------------------------- Cellular or Tissue Based Product Details Patient Name: Date of Service: [name] [name], [name] 01/02/2021 12:45 PM Medical Record Number: [id number] Patient Account Number: [id number] Date of Birth/Sex: Treating RN: Jul 23, 1955 (65 y.o. [name] Provider: [name] Other Clinician: Referring Provider: Treating Provider/Extender: [name] in Treatment: 53 Cellular or Tissue Based Product Type Wound #15 Left,Medial Malleolus Applied to: Performed By: Physician [name], PA Cellular or Tissue Based Product Type: Puraply AM Level of Consciousness (Pre-procedure): Awake and Alert Pre-procedure Verification/Time Out Yes - 13:20 Taken: Location: trunk / arms / legs Wound Size (sq cm): 7.92 Product Size (sq cm): 12 Waste Size (sq cm): 0 Amount of Product Applied (sq cm): 12 Instrument Used: Forceps,  Scissors Lot #: [id number] Order #: [id number] Expiration Date: 04/11/2023 Fenestrated: No Reconstituted: Yes Solution Type: saline Solution Amount: 2 ml Lot #: [id number] Solution Expiration Date: 01/31/2022 Secured: No Dressing Applied: Yes Primary Dressing: adaptic Procedural Pain: 0 Post Procedural Pain: 0 Response to Treatment: Procedure was tolerated well Level of Consciousness (Post- Awake and Alert procedure): Post Procedure Diagnosis Same as Pre-procedure Electronic Signature(s) Signed: 01/02/2021 5:22:07 PM By: [name] PA-C Signed: 01/02/2021 5:57:23 PM By: Baruch Gouty RN, BSN Entered By: Baruch Gouty on 01/02/2021 13:26:37 -------------------------------------------------------------------------------- HPI Details Patient Name: Date of Service: [name], [name] 01/02/2021 12:45 PM Medical Record Number: [id number] Patient Account Number: [id number] Date of Birth/Sex: Treating RN: [birth year] (65 y.o. [name] Provider: [name] Other Clinician: Referring Provider: Treating Provider/Extender: [name] in Treatment: 50 History of Present Illness HPI Description: the remaining wound is over the left medial ankle. Similar wound over the right medial ankle healed largely with use of Apligraf. Most recently we have been using Hydrofera Blue over this wound with considerable improvement. The patient has been extensively worked up in the past for her venous insufficiency and she is not a candidate for antireflux surgery although I have none of the details available currently. 08/24/14; considerable improvement today. About 50% of this wound areas now epithelialized. The base of the wound appears to be healthier granulation.as opposed to last week when she had deteriorated a considerable improvement 08/17/14; unfortunately the wound has regressed somewhat. The areas of epithelialization from the superior aspect are not  nearly as healthy as they were last week. The patient thinks her Hydrofera Blue slipped. 09/07/14; unfortunately the area has markedly regressed in the 2 weeks since I've seen this. There is an odor surrounding erythema. The healthy granulation tissue that we had at the base of the wound now is a dusky color. The  nurse reports green drainage 09/14/14; the area looks somewhat better than last week. There is less erythema and less drainage. The culture I did did not show any growth. Nevertheless I think it is better to continue the Cipro and doxycycline for a further week. The remaining wound area was debridement. 09/21/14. Wound did not require debridement last week. Still less erythema and less drainage. She can complete her antibiotics. The areas of epithelialization in the superior aspect of the wound do not look as healthy as they did some weeks ago 10/05/14 continued improvement in the condition of this wound. There is advancing epithelialization. Less aggressive debridement required 10/19/14 continued improvement in the condition and volume of this wound. Less aggressive debridement to the inferior part of this to remove surface slough and fibrinous eschar 11/02/14 no debridement is required. The surface granulation appears healthy although some of her islands of epithelialization seem to have regressed. No evidence of infection 11/16/14; lites surface debridement done of surface eschar. The wound does not look to be unhealthy. No evidence of infection. Unfortunately the patient has had podiatry issues in the right foot and for some reason has redeveloped small surface ulcerations in the medial right ankle. Her original presentation involved wounds in this area 11/23/14 no debridement. The area on the right ankle has enlarged. The left ankle wound appears stable in terms of the surface although there is periwound inflammation. There has been regression in the amount of new skin 11/30/14 no debridement.  Both wound areas appear healthy. There was no evidence of infection. The the new area on the right medial ankle has enlarged although that both the surfaces appear to be stable. 12/07/14; Debridement of the right medial ankle wound. No no debridement was done on the left. 12/14/14 no major change in and now bilateral medial ankle wounds. Both of these are very painful but the no overt evidence of infection. She has had previous venous ablation 12/21/14; patient states that her right medial ankle wound is considerably more painful last week than usual. Her left is also somewhat painful. She could not tolerate debridement. The right medial ankle wound has fibrinous surface eschar 12/28/14 this is a patient with severe bilateral venous insufficiency ulcers. For a considerable period of time we actually had the one on the right medial ankle healed however this recently opened up again in June. The left medial ankle wound has been a refractory area with some absent flows. We had some success with Hydrofera Blue on this area and it literally closed by 50% however it is recently opened up Foley. Both of these were debridement today of surface eschar. She tolerates this poorly 01/25/15: No change in the status of this. Thick adherent escar. Very poor tolerance of any attempt at debridement. I had healed the right medial malleolus wound for a considerable amount of time and had the left one down to about 50% of the volume although this is totally regressed over the last 48 weeks. Further the right leg has reopened. she is trying to make a appointment with pain and vascular, previous ablations with Dr. Aleda Grana. I do not believe there is an arterial insufficiency issue here 02/01/15 the status of the adherent eschar bilaterally is actually improved. No debridement was done. She did not manage to get vascular studies done 02/08/15 continued debridement of the area was done today. The slough is less adherent and comes  off with less pressure. There is no surrounding infection peripheral pulses are intact 02/15/15 selective debridement  with a disposable curette. Again the slough is less adherent and comes off with less difficulty. No surrounding infection peripheral pulses are intact. 02/22/15 selective debridement of the right medial ankle wound. Slough comes off with less difficulty. No obvious surrounding infection peripheral pulses are intact I did not debridement the one on the left. Both of these are stable to improved 03/01/15 selective debridement of both wound areas using a curette to. Adherent slough cup soft with less difficulty. No obvious surrounding infection. The patient tells me that 2 days ago she noted a rash above the right leg wrap. She did not have this on her lower legs when she change this over she arrives with widespread left greater than right almost folliculitis-looking rash which is extremely pruritic. I don't see anything to culture here. There is no rash on the rest of her body. She feels well systemically. 03/08/15; selective debridement of both wounds using a curette. Base of this does not look unhealthy. She had limegreen drainage coming out of the left leg wound and describes a lot of drainage. The rash on her left leg looks improved to. No cultures were done. 03/22/15; patient was not here last week. Basal wounds does not look healthy and there is no surrounding erythema. No drainage. There is still a rash on the left leg that almost looks vasculitic however it is clearly limited to the top of where the wrap would be. 04/05/15; on the right required a surgical debridement of surface eschar and necrotic subcutaneous tissue. I did not debridement the area on the left. These continue to be large open wounds that are not changing that much. We were successful at one point in healing the area on the right, and at the same time the area on the left was roughly half the size of current  measurements. I think a lot of the deterioration has to do with the prolonged time the patient is on her feet at work 04/19/15 I attempted-like surface debridement bilaterally she does not tolerate this. She tells me that she was in allergic care yesterday with extreme pain over her left lateral malleolus/ankle and was told that she has an "sprain" 05/03/15; large bilateral venous insufficiency wounds over the medial malleolus/medial aspect of her ankles. She complains of copious amounts of drainage and his usual large amounts of pain. There is some increasing erythema around the wound on the right extending into the medial aspect of her foot to. historically she came in with these wounds the right one healed and the left one came down to roughly half its current size however the right one is reopened and the left is expanded. 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 05/10/15 large bilateral venous insufficiency wounds. There is less adherence surface left however the surface culture that I did last week grew pseudomonas therefore bilateral selective debridement score necessary. There is surrounding erythema. The patient describes severe bilateral drainage and a lot of pain in the left ankle. Apparently her podiatrist was were ready to do a cortisone shot 05/17/15; the patient complains of pain and again copious amounts of drainage. 05/24/15; we used Iodo flex last week. Patient notes considerable improvement in wound drainage. Only needed to change this once. 05/31/15; we continued Iodoflex; the base of these large wounds bilaterally is not too bad but there is probably likely a significant bioburden here. I would like to debridement just doesn't tolerate it.  06/06/14 I would like to continue the Iodoflex although she still hasn't managed to obtain supplies. She has bilateral medial malleoli or large wounds which are mostly superficial. Both of them  are covered circumferentially with some nonviable fibrinous slough although she tolerates debridement very poorly. She apparently has an appointment for an ablation on the right leg by interventional radiology. 06/14/15; the patient arrives with the wounds and static condition. We attempted a debridement although she does not do well with this secondary to pain. I 07/05/15; wounds are not much smaller however there appears to be a cleaner granulating base. The left has tight fibrinous slough greater than the right. Debridement is tolerated poorly due to pain. Iodoflex is done more for these wounds in any of the multitude of different dressings I have tried on the left 1 and then subsequently the right. 07/12/15; no change in the condition of this wound. I am able to do an aggressive debridement on the right but not the left. She simply cannot tolerate it. We have been using Iodoflex which helps somewhat. It is worthwhile remembering that at one point we healed the right medial ankle wound and the left was about 25% of the current circumference. We have suggested returning to vascular surgery for review of possible further ablations for one reason or another she has not been able to do this. 07/26/15 no major change in the condition of either wound on her medial ankle. I did not attempt to debridement of these. She has been aggressively scrubbing these while she is in the shower at home. She has her supply of Iodoflex which seems to have done more for these wounds then anything I have put on recently. 08/09/15 wound area appears larger although not verified by measurements. Using Iodoflex 09/05/2015 -- she was here for avisit today but had significant problems with the wound and I was asked to see her for a physician opinion. I have summarize that this lady has had surgery on her left lower extremity about 10 years ago where the possible veins stripping was done. She has had an opinion from interventional  radiology around November 2016 where no further sclerotherapy was ordered. The patient works 12 hours a day and stands on a concrete floor with work boots and is unable to get the proper compression she requires and cannot elevate her limbs appropriately at any given time. She has recently grown Pseudomonas from her wound culture but has not started her ciprofloxacin which was called in for her. 09/13/15 this continues to be a difficult situation for this patient. At one point I had this wound down to a 1.5 x 1.5" wound on her left leg. This is deteriorated and the right leg has reopened. She now has substantial wounds on her medial calcaneus, malleoli and into her lower leg. One on the left has surface eschar but these are far too painful for me to debridement here. She has a vascular surgery appointment next week to see if anything can be done to help here. I think she has had previous ablations several years ago at [facility]. She has no major edema. She tells me that she did not get product last time [facility]] and went for several days without it. She continues to work in work boots 12 hours a day. She cannot get compression/4-layer under her work boots. 09/20/15 no major change. Periwound edema control was not very good. Her point with pain and vascular is next Wednesday the 25th 09/28/15; the  patient is seen vascular surgery and is apparently scheduled for repeat duplex ultrasounds of her bilateral lower legs next week. 10/05/15; the patient was seen by Dr. Doren Custard of vascular surgery. He feels that she should have arterial insufficiency excluded as cause/contributed to her nonhealing stage she is therefore booked for an arteriogram. She has apparently monophasic signals in the dorsalis pedis pulses. She also of course has known severe chronic venous insufficiency with previous procedures as noted previously. I had another long discussion with the patient today about her continuing to work 12 hour  shifts. I've written her out for 2 months area had concerns about this as her work location is currently undergoing significant turmoil and this may lead to her termination. She is aware of this however I agree with her that she simply cannot continue to stand for 12 hours multiple days a week with the substantial wound areas she has. 10/19/15; the Dr. Doren Custard appointment was largely for an arteriogram which was normal. She does not have an arterial issue. He didn't make a comment about her chronic venous insufficiency for which she has had previous ablations. Presumably it was not felt that anything additional could be done. The patient is now out of work as I prescribed 2 weeks ago. Her wounds look somewhat less aggravated presumably because of this. I felt I would give debridement another try today 10/25/15; no major change in this patient's wounds. We are struggling to get her product that she can afford into her own home through her insurance. 11/01/15; no major change in the patient's wounds. I have been using silver alginate as the most affordable product. I spoke to Dr. Marla Roe last week with her requested take her to the OR for surgical debridement and placement of ACEL. Dr. Marla Roe told me that she would be willing to do this however [facility] will not cover this, fortunately the patient has [place] healthcare of some variant 11/08/15; no major change in the patient's wounds. She has been completely nonviable surface that this but is in too much pain with any attempted debridement are clinic. I have arranged for her to see Dr. Marla Roe ham of plastic surgery and this appointment is on Monday. I am hopeful that they will take her to the OR for debridement, possible ACEL ultimately possible skin graft 11/22/15 no major change in the patient's wounds over her bilateral medial calcaneus medial malleolus into the lower legs. Surface on these does not look too bad however on the left  there is surrounding erythema and tenderness. This may be cellulitis or could him sleepy tinea. 11/29/15; no major changes in the patient's wounds over her bilateral medial malleolus. There is no infection here and I don't think any additional antibiotics are necessary. There is now plan to move forward. She sees Dr. Marla Roe in a week's time for preparation for operative debridement and ACEL placement I believe on 7/12. She then has a follow-up appointment with Dr. Marla Roe on 7/21 12/28/15; the patient returns today having been taken to the [facility] by Dr. Marla Roe 12/12/15 she underwent debridement, intraoperative cultures [which were negative]. She had placement of a wound VAC. Parent really ACEL was not available to be placed. The wound VAC foam apparently adhered to the wound since then she's been using silver alginate, Xeroform under Ace wraps. She still says there is a lot of drainage and a lot of pain 01/31/16; this is a patient I see monthly. I had referred her to Dr. Marla Roe him  of plastic surgery for large wounds on her bilateral medial ankles. She has been to the OR twice once in early July and once in early August. She tells me over the last 3 weeks she has been using the wound VAC with ACEL underneath it. On the right we've simply been using silver alginate. Under Kerlix Coban wraps. 02/28/16; this is a patient I'm currently seeing monthly. She is gone on to have a skin graft over her large venous insufficiency ulcer on the left medial ankle. This was done by Dr. Marla Roe him. The patient is a bit perturbed about why she didn't have one on her right medial ankle wound. She has been using silver alginate to this. 03/06/16; I received a phone call from her plastic surgery Dr. Marla Roe. She expressed some concern about the viability of the skin graft she did on the left medial ankle wound. Asked me to place Endoform on this. She told me she is not planning to do a subsequent skin graft on  the right as the left one did not take very well. I had placed Hydrofera Blue on the right 03/13/16; continue to have a reasonably healthy wound on the right medial ankle. Down to 3 mm in terms of size. There is epithelialization here. The area on the left medial ankle is her skin graft site. I suppose the last week this looks somewhat better. She has an open area inferiorly however in the center there appears to be some viable tissue. There is a lot of surface callus and eschar that will eventually need to come off however none of this looked to be infected. Patient states that the is able to keep the dressing on for several days which is an improvement. 03/20/16 no major change in the circumference of either wound however on the left side the patient was at Dr. Eusebio Friendly office and they did a debridement of left wound. 50% of the wound seems to be epithelialized. I been using Endoform on the left Hydrofera Blue in the right 03/27/16; she arrives today with her wound is not looking as healthy as they did last week. The area on the right clearly has an adherent surface to this a very similar surface on the left. Unfortunately for this patient this is all too familiar problem. Clearly the Endoform is not working and will need to change that today that has some potential to help this surface. She does not tolerate debridement in this clinic very well. She is changing the dressing wants 04/03/16; patient arrives with the wounds looking somewhat better especially on the right. Dr. Migdalia Dk change the dressing to silver alginate when she saw her on Monday and also sold her some compression socks. The usefulness of the latter is really not clear and woman with severely draining wounds. 04/10/16; the patient is doing a bit of an experiment wearing the compression stockings that Dr. Migdalia Dk provided her to her left leg and the out of legs based dressings that we provided to the right. 05/01/16; the patient is  continuing to wear compression stockings Dr. Migdalia Dk provided her on the left that are apparently silver impregnated. She has been using Iodoflex to the right leg wound. Still a moderate amount of drainage, when she leaves here the wraps only last for 4 days. She has to change the stocking on the left leg every night 05/15/16; she is now using compression stockings bilaterally provided by Dr. Marla Roe. She is wearing a nonadherent layer over the wounds so really  I don't think there is anything specific being done to this now. She has some reduction on the left wound. The right is stable. I think all healing here is being done without a specific dressing 06/09/16; patient arrives here today with not much change in the wound certainly in diameter to large circular wounds over the medial aspect of her ankle bilaterally. Under the light of these services are certainly not viable for healing. There is no evidence of surrounding infection. She is wearing compression stockings with some sort of silver impregnation as prescribed by Dr. Marla Roe. She has a follow-up with her tomorrow. 06/30/16; no major change in the size or condition of her wounds. These are still probably covered with a nonviable surface. She is using only her purchase stockings. She did see Dr. Marla Roe who seemed to want to apply Dakin's solution to this I'm not extreme short what value this would be. I would suggest Iodoflex which she still has at home. 07/28/16; I follow Mrs. Burket episodically along with Dr. Marla Roe. 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 She has been applying a topical collagen ointment to both wounds with Adaptic. I don't think Dr. Marla Roe is planning to take her back to the OR. 08/19/16; I follow [name] on a monthly basis along with Dr. Marla Roe of plastic surgery. She has very refractory venous insufficiency wounds on the bilateral  medial lower legs left greater than right. I been following her for a number of years. At one point I was able to get the right medial malleolus wound to heal and had the left medial malleolus down to about half its current size however and I had to send her to plastic surgery for an operative debridement. Since then things have been stable to slightly improve the area on the right is slightly better one in the left about the same although there is much less adherent surface than I'm used to with this patient. She is using some form of liquid collagen gel that Dr. Marla Roe provided a Kerlix cover with the patient's own pressure stockings. She tells me that she has extreme pain in both ankles and along the lateral aspect of both feet. She has been unable to work for some period of time. She is telling me she is retiring at the beginning of April. She sees Dr. Doran Durand of orthopedics next week 09/22/16; patient has not seen Dr. Marla Roe since the last time she is here. I'm not really sure what she is using to the wounds other than bits and pieces of think she had left over including most recently Hydrofera Blue. She is using juxtalite stockings. She is having difficulty with her husband's recent illness "stroke". She is having to transport him to various doctors appointments. Dr. Marla Roe left her the option of a repeat debridement with ACEL however she has not been able to get the time to follow-up on this. She continues to have a fair amount of drainage out of these wounds with certainly precludes leaving dressings on all week 10/13/16; patient has not seen Dr. Marla Roe since she was last in our clinic. I'm not really sure what she is doing with the wounds, we did try to get her [facility] and I think she is actually using this most of the time. Because of drainage she states she has to change this every second day although this is an improvement from what she used to do. She went to  see Dr.  Doran Durand who did not think she had a muscular issue with regards to her feet, he referred her to a neurologist and I think the appointment is sometime in June. I changed her back to Iodoflex which she has used in the past but not recently. 11/03/16; the patient has been using Iodoflex although she ran out of this. Still claims that there is a lot of drainage although the wound does not look like this. No surrounding erythema. She has not been back to see Dr. Marla Roe 11/24/16; the patient has been using Iodoflex again but she ran out of it 2 or 3 days ago. There is no major change in the condition of either one of these wounds in fact they are larger and covered in a thick adherent surface slough/nonviable tissue especially on the left. She does not tolerate mechanical debridement in our clinic. Going back to see Dr. Marla Roe of plastic surgery for an operative debridement would seem reasonable. 12/15/16; the patient has not been back to see Dr. Marla Roe. She is been dealing with a series of illnesses and her husband which of monopolized her time. She is been using Sorbact which we largely supplied. She states the drainage is bad enough that it maximum she can go 2-3 days without changing the dressing 01/12/2017 -- the patient has not been back for about 4 weeks and has not seen Dr. Marla Roe not does she have any appointment pending. 01/23/17; patient has not seen Dr. Marla Roe even though I suggested this previously. She is using Santyl that was suggested last week by Dr. Con Memos this Cost her $16 through her insurance which is indeed surprising 02/12/17; continuing Santyl and the patient is changing this daily. A lot of drainage. She has not been back to see plastic surgery she is using an Ace wrap. Our intake nurse suggested wrap around stockings which would make a good reasonable alternative 02/26/17; patient is been using Santyl and changing this daily due to drainage. She has not been to see  plastic surgery she uses in April Ace wrap to control the edema. She did obtain extremitease stockings but stated that the edema in her leg was to big for these 03/20/17; patient is using Santyl and Anasept. Surfaces looked better today the area on the right is actually measuring a little smaller. She has states she has a lot of pain in her feet and ankles and is asking for a consult to pain control which I'll try to help her with through our case manager. 04/10/17; the patient arrives with better-looking wound surfaces and is slightly smaller wound on the left she is using a combination of Santyl and Anasept. She has an appointment or at least as started in the pain control center associated with [facility] 05/14/17; this is a patient who I followed for a prolonged period of time. She has venous insufficiency ulcers on her bilateral medial ankles. At one point I had this down to a much smaller wound on the left however these reopened and we've never been able to get these to heal. She has been using Santyl and Anasept gel although 2 weeks ago she ran out of the Anasept gel. She has a stable appearance of the wound. She is going to the wound care clinic at [facility]. They wanted do a nerve block/spinal block although she tells me she is reluctant to go forward with that. 05/21/17; this is a patient I have followed for many years. She has venous insufficiency  ulcers on her bilateral medial ankles. Chronic pain and deformity in her ankles as well. She is been to see plastic surgery as well as orthopedics. Using PolyMem AG most recently/Kerramax/ABDs and 2 layer compression. She has managed to keep this on and she is coming in for a nurse check to change the dressing on Tuesdays, we see her on Fridays 06/05/17; really quite a good looking surface and the area especially on the right medial has contracted in terms of dimensions. Well granulated healthy-looking tissue on both sides. Even with an  open curet there is nothing that even feels abnormal here. This is as good as I've seen this in quite some time. We have been using PolyMem AG and bringing her in for a nurse check 06/12/17; really quite good surface on both of these wounds. The right medial has contracted a bit left is not. We've been using PolyMem and AG and she is coming in for a nurse visit 06/19/17; we have been using PolyMem AG and bringing her in for a nurse check. Dimensions of her wounds are not better but the surfaces looked better bilaterally. She complained of bleeding last night and the left wound and increasing pain bilaterally. She states her wound pain is more neuropathic than just the wounds. There was some suggestion that this was radicular from her pain management doctor in talking to her it is really difficult to sort this out. 06/26/17; using PolyMem and AG and bringing her in for a nurse check as All of this and reasonably stable condition. Certainly not improved. The dimensions on the lateral part of the right leg look better but not really measuring better. The medial aspect on the left is about the same. 07/03/16; we have been using PolyMen AG and bringing her in for a nurse check to change the dressings as the wounds have drainage which precludes once weekly changing. We are using all secondary absorptive dressings.our intake nurse is brought up the idea of using a wound VAC/snap VAC on the wound to help with the drainage to see if this would result in some contraction. This is not a bad idea. The area on the right medial is actually looking smaller. Both wounds have a reasonable-looking surface. There is no evidence of cellulitis. The edema is well controlled 07/10/17; the patient was denied for a snap VAC by her insurance. The major issue with these wounds continues to be drainage. We are using wicked PolyMem AG and she is coming in for a nurse visit to change this. The wounds are stable to slightly improved. The  surface looks vibrant and the area on the right certainly has shrunk in size but very slowly 07/17/17; the patient still has large wounds on her bilateral medial malleoli. Surface of both of these wounds looks better. The dimensions seem to come and go but no consistent improvement. There is no epithelialization. We do not have options for advanced treatment products due to insurance issues. They did not approve of the wound VAC to help control the drainage. More recently we've been using PolyMem and AG wicked to allow drainage through. We have been bringing her in for a nurse visit to change this. We do not have a lot of options for wound care products and the home again due to insurance issues 07/24/17; the patient's wound actually looks somewhat better today. No drainage measurements are smaller still healthy-looking surface. We used silver collagen under PolyMen started last week. We have been bringing her in for  a dressing change 07/31/17; patient's wound surface continued to look better and I think there is visible change in the dimensions of the wound on the right. Rims of epithelialization. We have been using silver collagen under PolyMen and bringing her in for a dressing change. There appears to be less drainage although she is still in need of the dressing change 08/07/17. Patient's wound surface continues to look better on both sides and the area on the right is definitely smaller. We have been using silver collagen and PolyMen. She feels that the drainage has been it has been better. I asked her about her vascular status. She went to see Dr. Aleda Grana at [facility] and had some form of ablation. I don't have much detail on this. I haven't my notes from 2016 that she was not a candidate for any further ablation but I don't have any more information on this. We had referred her to vein and vascular I don't think she ever went. He does not have a history of PAD although I don't have any  information on this either. We don't even have ABIs in our record 08/14/17; we've been using silver collagen and PolyMen cover. And putting the patient and compression. She we are bringing her in as a nurse visit to change this because ofarge amount of drainage. We didn't the ABIs in clinic today since they had been done in many moons 1.2 bilaterally. She has been to see vein and vascular however this was at [facility] and she had ablation although I really don't have any information on this all seemed biking get a report. She is also been operatively debrided by plastic surgery and had a cell placed probably 8-12 months ago. This didn't have a major effect. We've been making some gains with current dressings 08/19/17-She is here in follow-up evaluation for bilateral medial malleoli ulcers. She continues to tolerate debridement very poorly. We will continue with recently changed topical treatment; if no significant improvement may consider switching to Iodosorb/Iodoflex. 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 She has been using silver collagen and PolyMem. I believe she has been used and tried on Iodoflex before. 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 These have been very refractory over time. We have been using silver collagen and PolyMen. She is coming in once a week for a doctor's and once a week for nurse visits. We are actually making some progress 09/18/17; the patient's wounds are smaller especially on the right medial. She arrives today to upset to consider even washing these off with Anasept which I think is been part of the reason this is been closing. We've been using collagen covered in PolyMen otherwise. It is noted that she has a small area  of folliculitis on the right medial calf that. As we are wrapping her legs I'll give her a short course of doxycycline to make sure this doesn't amount to anything. She is a long list of complaints today including imbalance, shortness of breath on exertion, inversion of her left ankle. With regards to the latter complaints she is been to see orthopedics and they offered her a tendon release surgery I believe but wanted her wounds to be closed first. I have recommended she go see her primary physician with regards  to everything else. 09/25/17; patient's wounds are about the same size. We have made some progress bilaterally although not in recent weeks. She will not allow me T wash these wounds with Anasept even if she is doing her cell. Wheeze we've been using collagen covered in PolyMen. Last week she had a small area of folliculitis this is now opened into a small wound. She completed 5 days of trimethoprim sulfamethoxazole 10/02/17; unfortunately the area on her left medial ankle is worse with a larger wound area towards the Achilles. The patient complains of a lot of pain. She will not allow debridement although visually I don't think there is anything to debridement in any case. We have been using silver collagen and PolyMen for several months now. Initially we are making some progress although I'm not really seeing that today. We will move back to [facility]. His admittedly this is a bit of a repeat however I'm hoping that his situation is different now. The patient tells me she had her leg on the left give out on her yesterday this is process some pain. 10/09/17; the patient is seen twice a week largely because of drainage issues coming out of the chronic medial bimalleolar wounds that are chronic. Last week the dimensions of the one on the left looks a little larger I changed her to [facility]. She comes in today with a history of terrible pain in the bilateral wound areas. She will not  allow debridement. She will not even allow a tissue culture. There is no surrounding erythema no no evidence of cellulitis. We have been putting her Kerlix Coban man. She will not allow more aggressive compression as there was a suggestion to put her in 3 layer wraps. 10/16/17; large wounds on her bilateral medial malleoli. These are chronic. Not much change from last week. The surface looks have healthy but absolutely no epithelialization. A lot of pain little less so of drainage. She will not allow debridement or even washing these off in the vigorous fashion with Anasept. 10/23/17; large wounds on her bilateral malleoli which are chronic. Some improvement in terms of size perhaps on the right since last time I saw these. She states that after we increased the 3 layer compression there was some bleeding, when she came in for a nurse visit she did not want 3 layer compression put back on about our nurse managed to convince her. She has known chronic venous visit issues and I'm hoping to get her to tolerate the 3 layer compression. using Hydrofera Blue 10/30/17; absolutely no change in the condition of either wound although we've had some improvement in dimensions on the right.. Attempted to put her in 3 layer compression she didn't tolerated she is back in 2 layer compression. We've been using Hydrofera Blue We looked over her past records. She had venous reflux studies in November 2016. There was no evidence of deep venous reflux on the right. Superficial vein did not show the greater saphenous vein at think this is been previously ablated the small saphenous vein was within normal limits. The left deep venous system showed no DVT the vessels were positive for deep venous reflux in the posterior tibial veins at the ankle. The greater saphenous vein was surgically absent small saphenous vein was within normal limits. She went to vein and vascular at [facility]. I believe she had an ablation on the left  greater saphenous vein. I'll update her reflux studies perhaps ever reviewed by vein and vascular.  We've made absolutely no progress in these wounds. Will also try to read and TheraSkins through her insurance 11/06/17; W the patient apparently has a 2 week follow-up with vein and vascular I like him to review the whole issue with regards to her previous vascular workup by Dr. Aleda Grana. We've really made no progress on these wounds in many months. She arrives today with less viable looking surface on the left medial ankle wound. This was apparently looking about the same on Tuesday when she was here for nurse visit. 11/13/17; deep tissue culture I did last time of the left lower leg showed multiple organisms without any predominating. In particular no Staphylococcus or group A strep were isolated. We sent her for venous reflux studies. She's had a previous left greater saphenous vein stripping and I think sclerotherapy of the right greater saphenous vein. She didn't really look at the lesser saphenous vein this both wounds are on the medial aspect. She has reflux in the common femoral vein and popliteal vein and an accessory vein on the right and the common femoral vein and popliteal vein on the left. I'm going to have her go to see vein and vascular just the look over things and see if anything else beside aggressive compression is indicated here. We have not been able to make any progress on these wounds in spite of the fact that the surface of the wounds is never look too bad. 11/20/17; no major change in the condition of the wounds. Patient reports a large amount of drainage. She has a lot of complaints of pain although enlisting her today I wonder if some of this at least his neuropathic rather than secondary to her wounds. She has an appointment with vein and vascular on 12/30/17. The refractory nature of these wounds in my mind at least need vein and vascular to look over the wounds the recent  reflux studies we did and her history to see if anything further can be done here. I also note her gait is deteriorated quite a bit. 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 She has a bilateral Trendelenburg gait. I wonder if this is neuropathic or perhaps multilevel radicular. 11/27/17; her wounds actually looks slightly better. Healthy-looking granulation tissue a scant amount of epithelialization. [place] healthcare will not pay for [employer]. They will play for tri layer Oasis and Dermagraft. This is not a diabetic ulcer. We'll try for the tri layer Oasis. She still complains of some drainage. She has a vein and vascular appointment on 12/30/17 12/04/17; the wounds visually look quite good. Healthy-looking granulation with some degree of epithelialization. We are still waiting for response to our request for trial to try layer Oasis. Her appointment with vascular to review venous and arterial issues isn't sold the end of July 7/31. Not allow debridement or even vigorous cleansing of the wound surface. 12/18/17; slightly smaller especially on the right. 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 We've been using Hydrofera Blue. We still are looking into triple layer Oasis through her insurance 01/08/18 on evaluation today patient's wound actually appears to be showing signs of good improvement at this point in time. She has been tolerating the dressing changes without complication. Fortunately there does not appear to be any evidence of infection at this point in time. We have been utilizing silver nitrate which does seem to be of benefit for her which is also good news. Overall I'm very happy with how things seem to be  both regards appearance as well as measurement. Patient did see Dr. Bridgett Larsson for evaluation on 12/30/17. In his assessment he felt that stripping would not likely add much more than chronic compression to the patient's healing process. His  recommendation was to follow-up in three months with Dr. Doren Custard if she hasn't healed in order to consider referral back to you and see vascular where she previously was in a trial and was able to get her wound to heal. I'll be see what she feels she when you staying compression and he reiterated this as well. 01/13/18 on evaluation today patient appears to actually be doing very well in regard to her bilateral medial malleolus ulcers. She seems to have tolerated the chemical cauterization with silver nitrate last week she did have some pain through that evening but fortunately states that I'll be see since it seems to be doing better she is overall pleased with the progress. 01/21/18; really quite a remarkable improvement since I've last seen these wounds. We started using silver nitrate specially on the islands of hyper granulation which for some reason her around the wound circumference. This is really done quite nicely. Primary dressing Hydrofera Blue under 4 layer compression. She seems to be able to hold out without a nurse rewrap. Follow-up in 1 week 01/28/18; we've continued the hydrofera blue but continued with chemical cauterization to the wound area that we started about a month ago for irregular hyper granulation. She is made almost stunning improvement in the overall wound dimensions. I was not really expecting this degree of improvement in these chronic wounds 02/05/18; we continue with Hydrofera Bluebut of also continued the aggressive chemical cauterization with silver nitrate. We made nice progress with the right greater than left wound. 02/12/18. We continued with Hydrofera Blue after aggressive chemical cauterization with silver nitrate. We appear to be making nice progress with both wound areas 02/19/2018; we continue with [facility] after washing the wounds vigorously with Anasept spray and chemical cauterization with silver nitrate. We are making excellent progress. The area on the  right's just about closed 02/26/2018. The area on the left medial ankle had too much necrotic debris today. I used a #5 curette we are able to get most of the soft. I continued with the silver nitrate to the much smaller wound on the right medial ankle she had a new area on her right lower pretibial area which she says was due to a role in her compression 03/05/2018; both wound areas look healthy. Not much change in dimensions from last week. I continue to use silver nitrate and Hydrofera Blue. The patient saw Dr. Doren Custard of vein and vascular. He felt she had venous stasis ulcers. He felt based on her previous arteriogram she should have adequate circulation for healing. Also she has deep venous reflux but really no significant correctable superficial venous reflux at this time. He felt we should continue with conservative management including leg elevation and compression 04/02/2018; since we last saw this woman about a month ago she had a fall apparently suffered a pelvic fracture. I did not look up the x-ray. Nevertheless because of pain she literally was bedbound for 2 weeks and had home health coming out to change the dressing. Somewhat predictably this is resulted in considerable improvement in both wound areas. The right is just about closed on the medial malleolus and the left is about half the size. 04/16/2018; both her wounds continue to go down in size. Using Hydrofera Blue. 05/07/18; both her  wounds appeared to be improving especially on the right where it is almost closed. We are using Hydrofera Blue 05/14/2018; slightly worse this week with larger wounds. Surface on the left medial not quite as good. We have been using Hydrofera Blue 05/21/18; again the wounds are slightly larger. Left medial malleolus slightly larger with eschar around the circumference. We have been using Hydrofera Blue undergoing a wraps for a prolonged period of time. This got a lot better when she was more recumbent due  to a fall and a back injury. I change the primary dressing the silver alginate today. She did not tolerate a 4 layer compression previously although I may need to bring this up with her next time 05/28/2018; area on the left medial malleolus again is slightly larger with more drainage. Area on the right is roughly unchanged. She has a small area of folliculitis on the right medial just on the lower calf. This does not look ominous. 06/03/2018 left medial malleolus slightly smaller in a better looking surface. We used silver nitrate on this last time with silver alginate. The area on the right appears slightly smaller 1/10; left medial malleolus slightly smaller. Small open area on the right. We used silver nitrate and silver alginate as of 2 weeks ago. We continue with the wound and compression. These got a lot better when she was off her feet 1/17; right medial malleolus wound is smaller. The left may be slightly smaller. Both surfaces look somewhat better. 1/24; both wounds are slightly smaller. Using silver alginate under Unna boots 1/31; both wounds appear smaller in fact the area on the right medial is just about closed. Surface eschar. We have been using silver alginate under Unna boots. The patient is less active now spends let much less time on her feet and I think this is contributed to the general improvement in the wound condition 2/7; both wounds appear smaller. I was hopeful the right medial would be closed however there there is still the same small open area. Slight amount of surface eschar on the left the dimensions are smaller there is eschar but the wound edges appear to be free. We have been using silver alginate under Unna boot's 2/14; both wounds once again measure smaller. Circumferential eschar on the left medial. We have been using silver alginate under Unna boots with gradual improvement 2/21; the area on the right medial malleolus has healed. The area on the left is smaller.  We have been using silver alginate and Unna boots. We can discharge wrapping the right leg she has 20/30 stockings at home she will need to protect the scar tissue in this area 2/28; the area on the right medial malleolus remains closed the patient has a compression stocking. The area on the left is smaller. We have been using silver alginate and Unna boots. 3/6 the area on the right medial ankle remains closed. Good edema control noted she is using her own compression stocking. The area on the left medial ankle is smaller. We have been managing this with silver alginate and Unna boots which we will continue today. 3/13; the area on the right medial ankle remains closed and I'm declaring it healed today. When necessary the left is about the same still a healthy-looking surface but no major change and wound area. No evidence of infection and using silver alginate under unna and generally making considerable improvement 3/27 the area on the right medial ankle remains closed the area on the left is about  the same as last week. Certainly not any worse we have been using silver alginate under an Unna boot 4/3; the area on the right medial ankle remains closed per the patient. We did not look at this wound. The wound on the left medial ankle is about the same surface looks healthy we have been using silver alginate under an Unna boot 4/10; area on the right medial ankle remains closed per the patient. We did not look at this wound. The wound on the left medial ankle is slightly larger. The patient complains that the [facility] caused burning pain all week. She also told us that she was a lot more active this week. Changed her back to silver alginate 4/17; right medial ankle still closed per the patient. Left medial ankle is slightly larger. Using silver alginate. She did not tolerate Hydrofera Blue on this area 4/24; right medial ankle remains closed we have not look at this. The left medial ankle  continues to get larger today by about a centimeter. We have been using silver alginate under Unna boots. She complains about 4 layer compression as an alternative. She has been up on her feet working on her garden 5/8; right medial ankle remains closed we did not look at this. The left medial ankle has increased in size about 100%. We have been using silver alginate under Unna boots. She noted increased pain this week and was not surprised that the wound is deteriorated 5/15; no major change in SA however much less erythema ( one week of doxy ocellulitis). 5/22-65 year old female returns at 1 week to the clinic for left medial ankle wound for which we have been using silver alginate under 3 layer compression She was placed on DOXY at last visit - the wound is wider at this visit. She is in 3 layer compression 5/29; change to [facility] last week. I had given her empiric doxycycline 2 weeks ago for a week. She is in 3 layer compression. She complains of a lot of pain and drainage on presentation today. 6/5; using Hydrofera Blue. I gave her doxycycline recently empirically for erythema and pain around the wound. Believe her cultures showed enterococcus which not would not have been well covered by doxycycline nevertheless the wound looks better and I don't feel specifically that the enterococcus needs to be covered. She has a new what looks like a wrap injury on her lateral left ankle. 6/12; she is using Hydrofera Blue. She has a new area on the left anterior lower tibial area. This was a wrap injury last week. 6/19; the patient is using Hydrofera Blue. She arrived with marked inflammation and erythema around the wound and tenderness. 12/01/18 on evaluation today patient appears to be doing a little bit better based on what I'm hearing from the standpoint of lassos evaluation to this as far as the overall appearance of the wound is concerned. Then sometime substandard she typically sees Dr. Dellia Nims.  Nonetheless overall very pleased with the progress that she's made up to this point. No fevers, chills, nausea, or vomiting noted at this time. 7/10; some improvement in the surface area. Aggressively debrided last week apparently. I went ahead with the debridement today although the patient does not tolerate this very well. We have been using Iodoflex. Still a fair amount of drainage 7/17; slightly smaller. Using Iodoflex. 7/24; no change from last week in terms of surface area. We have been using Iodoflex. Surface looks and continues to look somewhat better 7/31; surface  area slightly smaller better looking surface. We have been using Iodoflex. This is under Unna boot compression 8/7-Patient presents at 1 week with Unna boot and Iodoflex, wound appears better 8/14-Patient presents at 1 week with Iodoflex, we use the Unna boot, wound appears to be stable better.Patient is getting Botox treatment for the inversion of the foot for tendon release, Next week 8/21; we are using Iodoflex. Unna boot. The wound is stable in terms of surface area. Under illumination there is some areas of the wound that appear to be either epithelialized or perhaps this is adherent slough at this point I was not really clear. It did not wipe off and I was reluctant to debride this today. 8/28; we are using Iodoflex in an Unna boot. Seems to be making good improvement. 9/4; using Iodoflex and wound is slightly smaller. 9/18; we are using Iodoflex with topical silver nitrate when she is here. The wound continues to be smaller 10/2; patient missed her appointment last week due to GI issues. She left and Iodoflex based dressing on for 2 weeks. Wound is about the same size about the size of a dime on the left medial lower 10/9 we have been using Iodoflex on the medial left ankle wound. She has a new superficial probable wrap injury on the dorsal left ankle 10/16; we have been using Hydrofera Blue since last week. This is on the  left medial ankle 10/23; we have been using Hydrofera Blue since 2 weeks ago. This is on the left medial ankle. Dimensions are better 11/6; using Hydrofera Blue. I think the wound is smaller but still not closed. Left medial ankle 11/13; we have been using Hydrofera Blue. Wound is certainly no smaller this week. Also the surface not as good. This is the remanent of a very large area on her left medial ankle. 11/20; using Sorbact since last week. Wound was about the same in terms of size although I was disappointed about the surface debris 12/11; 3-week follow-up. Patient was on vacation. Wound is measuring slightly larger we have been using Sorbact. 12/18; wound is about the same size however surface looks better last week after debridement. We have been using Sorbact under compression 1/15 wound is probably twice the size of last time increased in length nonviable surface. We have been using Sorbact. She was running a mild fever and missed her appointment last week 1/22; the wound is come down in size but under illumination still a very adherent debris we have been Hydrofera Blue that I changed her to last week 1/29; dimensions down slightly. We have been using Hydrofera Blue 2/19 dimensions are the same however there is rims of epithelialization under illumination. Therefore more the surface area may be epithelialized 2/26; the patient's wound actually measures smaller. The wound looks healthy. We have been using Hydrofera Blue. I had some thoughts about running Apligraf then I still may do that however this looks so much better this week we will delay that for now 3/5; the wound is small but about the same as last week. We have been using Hydrofera Blue. No debridement is required today. 3/19; the wound is about the size of a dime. Healthy looking wound even under illumination. We have been using Hydrofera Blue. No mechanical debridement is necessary 3/26; not much change from last week although  still looks very healthy. We have been using Hydrofera Blue under Unna boots Patient was offered an ankle fusion by podiatry but not until the wound heals with  a proceed with this. 4/9; the patient comes in today with her original wound on the medial ankle looking satisfactory however she has some uncontrolled swelling in the middle part of her leg with 2 new open areas superiorly just lateral to the tibia. I think this was probably a wrap issue. She said she felt uncomfortable during the week but did not call in. We have been using Hydrofera Blue 4/16; the wound on the medial ankle is about the same. She has innumerable small areas superior to this across her mid tibia. I think this is probably folliculitis. She is also been working in the yard doing a lot of sweating 4/30; the patient issue on the upper areas across her mid tibia of all healed. I think this was excessive yard work if I remember. Her wound on the medial ankle is smaller. Some debris on this we have been using Hydrofera Blue under Unna boots 5/7; mid tibia. She has been using Hydrofera Blue under an Unna wrap. She is apparently going for her ankle surgery on June 3 10/28/19-Patient returns to clinic with the ankle wound, we are using Hydrofera Blue under Unna wrap, surgery is scheduled for her left foot for June 3 so she will be back for nurse visit next week READMISSION 01/17/2020 [name] is a 65 year old woman we have had in this clinic for a long period of time with severe venous hypertension and refractory wounds on her medial lower legs and ankles bilaterally. 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 When she was off her legs for a prolonged period example when she fell and suffered a compression fracture things would heal up quite nicely. She is now retired and we managed to heal up the right medial  leg wound. The left one was very tiny last time I saw this although still refractory. 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 It got to the point where this was interfering with her walking and she elected to proceed with a ankle arthrodesis to straighten her her ankle and leave her with a functional outcome for mobilization. The patient was referred to Dr. Doren Custard and really this took some time to arrange. Dr. Doren Custard saw her on 12/07/2019. Once again he verified that she had no arterial issues. She had previously had an angiogram several years ago. Follow-up ABIs on the left showed an ABI of 1.12 with triphasic waveforms and a TBI of 0.92. She is felt to have chronic deep venous insufficiency but I do not think it was felt that anything could be done from about this from an ablation point of view. At the time Dr. Doren Custard saw this patient the wounds actually look closed via the pictures in his clinic. The patient finally underwent her surgery on 12/15/2019. This went reasonably well and there was a good anatomic outcome. She developed a small distal wound dehiscence on the lateral part of the surgical wound. However more problematically she is developed recurrence of the wound on the medial left ankle. There are actually 2 wounds here one in the distal lower leg and 1 pretty much at the level of the medial malleolus. It is a more distal area that is more problematic. She has been using Hydrofera Blue which started on Friday before this she was  simply Ace wrapping. There was a culture done that showed Pseudomonas and she is on ciprofloxacin. A recent CNS on 8/11 was negative. The patient reports some pain but I generally think this is improving. She is using a cam boot completely nonweightbearing using a walker for pivot transfers and a wheelchair 8/24; not much improvement unfortunately she has a surgical wound on the  lateral part in the venous insufficiency wound medially. The bottom part of the medial insufficiency wound is still necrotic there is exposed tendon here. We have been using Hydrofera Blue under compression. Her edema control is however better 8/31; patient in for follow-up of his surgical wound on the lateral part of her left leg and chronic venous insufficiency ulcers medially. We put her back in compression last week. She comes in today with a complaint of 3 or 4 days worth of increasing pain. She felt her cam walker was rubbing on the area on the back of her heel. However there is intense erythema seems more likely she has cellulitis. She had 2 cultures done when she was seeing podiatry in the postop. One of them in late July showed Pseudomonas and she received a course of ciprofloxacin the other was negative on 8/11 she is allergic to penicillin with anaphylactoid complaints of hives oral swelling via information in epic 9/9; when I saw this patient last week she had intense anterior erythema around her wound on the right lateral heel and ankle and also into the right medial heel. Some of this was no doubt drainage and her walker boot however I was convinced she had cellulitis. I gave her Levaquin and Bactrim she is finishing up on this now. She is following up with Dr. [name] he saw her yesterday. He is taken her out of the walking boot of course she is still nonweightbearing. Her x-ray was negative for any worrisome features such as soft tissue air etc. Things are a lot better this week. She has home health. We have been using Hydrofera Blue under an [employer] which she put back on yesterday. I did not wrap her last week 9/17; her surrounding skin looks a lot better. In fact the area on the left lateral ankle has just a scant amount of eschar. The only remaining wound is the large area on the left medial ankle. Probably about 60% of this is healthy granulation at the surface however she has a  significant divot distally. This has adherent debris in it. I been using debridement and silver collagen to try and get this area to fill-in although I do not think we have made much progress this week 9/24; the patient's wound on the left medial ankle looks a lot better. The deeper divot area distally still requires debridement but this is cleaning up quite nicely we have been using silver collagen. The patient is complaining of swelling in her foot and is worried that that is contributing to the nonhealing of the ankle wound. She is also complaining of numbness in her anterior toes 10/4; left medial ankle. The small area distally still has a divot with necrotic material that I have been debriding away. This has an undermining area. She is approved for Apligraf. She saw Dr. [name] her surgeon on 10/1. I think he declared himself is satisfied with the condition of things. Still nonweightbearing till the end of the month. 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 Her surgical wound is well closed. There is no evidence of  infection 10/11; the patient arrived in clinic today with the expectation that we be able to put an Apligraf on this area after debridement however she arrives with a relatively large amount of green drainage on the dressing. The patient states that this started on Friday. She has not been systemically unwell. 10/19; culture I did last week showed both Enterococcus and Pseudomonas. I think this came in separate parts because I stopped her ciprofloxacin I gave her and prescribed her linezolid however now looking at the final culture result this is Pseudomonas which is resistant to quinolones. She has not yet picked up the linezolid apparently phone issues. We are also trying to get a topical antibiotic out of [place] in [place] they can be applied by home health. She is still having green drainage 10/16; the patient has her topical antibiotic  from [facility] in [place]. This is a compounded gel with vancomycin and ciprofloxacin and gentamicin. We are applying this on the wound bed with silver alginate over the top with Unna boot wraps. She arrives in clinic today with a lot less ominous looking drainage although she is only use this topical preparation once the second time today. She sees Dr. [name] her surgeon on Friday she has home health changing the dressing 11/2; still using her compounded topical antibiotic under silver alginate. Surface is cleaning up there is less drainage. We had an Apligraf for her today and I elected to apply it. A light coating of her antibiotic 04/25/2020 upon evaluation today patient appears to be doing well with regard to her ankle ulcer. There is a little bit of slough noted on the surface of the wound I am can have to perform sharp debridement to clear this away today. With that being said other than that fact overall I feel like she is making progress and we do see some new epithelial growth. There is also some improvement in the depth of the wound and that distal portion. There is little bit of slough there as well. 12/7; 2-week follow-up. Apligraf #3. Dimensions are smaller. Closing in especially inferiorly. Still some surface debris. Still using the [facility] topical antibiotic but I told her that I don't think this needs to be renewed 12/21; 2-week follow-up. Apligraf #4 dimensions are smaller. Nice improvement 06/05/2020; 2-week follow-up. The patient's wound on the left medial ankle looks really excellent. Nice granulation. Advancing epithelialization no undermining no evidence of infection. We would have to reapply for another Apligraf but with the condition of this wound I did not feel strongly about it. We used Hydrofera Blue under the same degree of compression. She follows up with Dr. [name] her surgeon a week Friday 06/13/2020 upon evaluation today patient appears to be doing excellent in  regard to her wound. She has been tolerating the dressing changes without complication. Fortunately there is no signs of active infection at this time. No fevers, chills, nausea, vomiting, or diarrhea. She was using Hydrofera Blue last week. 06/20/2020 06/20/2020 on evaluation today patient actually appears to be doing excellent in regard to her wound. This is measuring better and looking much better as well. She has been using the collagen that seems to be doing better for her as well even though the [facility] was and is not sticking or feeling as rough on her wound. She did see Dr. [name] on Friday he is very pleased he also stated none of the hardware has shifted. That is great news 1/27; the patient has a small clean  wound all that is remaining. I agree that this is too small to really consider an Apligraf. Under illumination the surface is looking quite good. We have been using collagen although the dimensions are not any better this week 2/2; the patient has a small clean wound on the left medial ankle. Although this left of her substantial original areas. Measurements are smaller. We have been using polymen Ag under an Haematologist. 2/10; small area on the left medial ankle. This looks clean nothing to debride however dimensions are about the same we have been using polymen I think now for 2 weeks 2/17; not much change in surface area. We have been using polymen Ag without any improvement. 3/17; 1 month follow-up. The patient has been using endoform without any improvement in fact I think this looks worse with more depth and more expansion 3/24; no improvement. Perhaps less debris on the surface. We have been using Sorbact for 1 week 4/4; wound measures larger. She has edema in her leg and her foot which she tells as her wrap came down. We have been using Unna boots. Sorbact of the wound. She has been approved for Apligraf 09/12/2020 upon evaluation today patient appears to be doing well with  regard to her wound. We did get the Apligraf reapproved which is great news we have that available for application today. Fortunately there is no signs of infection and overall the patient seems to be doing great. The wound bed is nice and clean. 4/27; patient presents for her second application of Apligraf. She states over the past week she has been on her feet more often due to being outside in her garden. She has noted more swelling to her foot as a result. She denies increased warmth, pain or erythema to the wound site. 10/10/2020 upon evaluation today patient appears to be doing well with regard to her wound which does not appear to be quite as irritated as last week from what I am hearing. With that being said unfortunately she is having issues with some erythema and warmth to touch as well as an increase size. I do believe this likely is infected. 10/17/2020 upon evaluation today patient appears to be doing excellent in regard to her wound this is significantly improved as compared to last week. Fortunately I think that the infection is much better controlled at this point. She did have evidence of both Enterococcus as well as Staphylococcus noted on culture. Enterococcus really would not be helped significantly by the Cipro but the wound is doing so much better I am under the assumption that the Staphylococcus is probably the main organism that is causing the current infection. Nonetheless I think that she is doing excellent as far as that is concerned and I am very pleased in that regard. I would therefore recommend she continue with the Cipro. 10/31/2020 upon evaluation today patient appears to be doing well with regard to her wound. She has been tolerating the dressing changes without complication. Fortunately there is no signs of active infection and overall I am extremely pleased with where things stand today. No fevers, chills, nausea, vomiting, or diarrhea. With that being said she does have  some green drainage coming from the wound and although it looks okay I am a little concerned about the possibility of a continuing infection. Specifically with Pseudomonas. For that reason I will go ahead and send in a prescription for Cipro for her to be continued. 11/14/2020 upon evaluation today patient appears to be  doing very well currently in regard to her wound on her leg. She has been tolerating the dressing changes without complication. Fortunately I feel like the infection is finally under good control here. Unfortunately we do not have the Apligraf for application today although we can definitely order to have it in place for next week. That will be her fifth and final of the current series. Nonetheless I feel like her wound is really doing quite well which is great news. 11/21/2020 upon evaluation today patient appears to be doing well with regard to her wound on the medial ankle. Fortunately I think the infection is under control and I do believe we can go ahead and reapply the Apligraf today. She is in agreement with that plan. There does not appear to be any signs of active infection at this time which is great news. No fevers, chills, nausea, vomiting, or diarrhea. 12/05/2020 upon evaluation today patient's wound bed actually showed signs of good granulation epithelization at this point. There does not appear to be any signs of infection which is great news and overall very pleased with where things stand. Overall the patient seems to be doing fairly well in my opinion with regard to her wound although I do believe she continues to build up a lot of biofilm I think she could benefit from using PuraPly at this point. 12/12/2020 upon evaluation today patient's wound actually appears to be doing decently well today. The Unna boot has not been quite as well-tolerated so that more uncomfortable for her and even causing some pressure over the plate on the lateral portion of her foot which is 90  where the wound is. There did not appear to be any significant deep tissue injury with that there may be a minimal change in the skin noted I think that we may want to go back to the Coflex 2 layer which is a little bit easier on her skin it seems. 12/19/2020 upon evaluation today patient actually seems to be making great progress with the PuraPly currently. She in fact seems to be much better as far as the overall appearance of the wound bed is concerned I am very happy in this regard. I do not see any signs of of infection which is great news as well. No fevers, chills, nausea, vomiting, or diarrhea. 12/26/2020 upon evaluation today patient appears to actually be doing better in regard to her wound on the left medial ankle region. The surface of the wound is actually doing significantly better which is great news. There does not appear to be any signs of infection which is also great news and in general I am extremely pleased with where we stand today. 01/02/2021 upon evaluation today patient appears to be doing well with regard to her wound. In fact this is showing signs of excellent improvement and very pleased with where things stand. In fact the last 3 appointments have all shown signs of this getting smaller which is excellent news. I have not even had to perform any debridement and today is no exception. Overall I feel like this is dramatically improved compared to previous. T oday is PuraPly application #4. Electronic Signature(s) Signed: 01/02/2021 1:30:45 PM By: [name] PA-C Entered By: [name] on 01/02/2021 13:30:45 -------------------------------------------------------------------------------- Physical Exam Details Patient Name: Date of Service: [facility] [name], [name] 01/02/2021 12:45 PM Medical Record Number: [id number] Patient Account Number: [id number] Date of Birth/Sex: Treating RN: Sep 14, 1955 (65 y.o. [name], [facility]  Provider: [name] Other  Clinician: Referring Provider: Treating Provider/Extender: [name] in Treatment: 70 Constitutional Well-nourished and well-hydrated in no acute distress. Respiratory normal breathing without difficulty. Psychiatric this patient is able to make decisions and demonstrates good insight into disease process. Alert and Oriented x 3. pleasant and cooperative. Notes Upon inspection patient's wound bed actually showed signs of good granulation epithelization at this point. The overall surface of the wound appears to be doing all some. I am actually extremely pleased with where things stand today. No fevers, chills, nausea, vomiting, or diarrhea. Electronic Signature(s) Signed: 01/02/2021 1:31:11 PM By: [name] PA-C Entered By: [name] on 01/02/2021 13:31:11 -------------------------------------------------------------------------------- Physician Orders Details Patient Name: Date of Service: [name], [name] Kingfisher G. 01/02/2021 12:45 PM Medical Record Number: [id number] Patient Account Number: [id number] Date of Birth/Sex: Treating RN: 1955-10-07 (65 y.o. [name] Provider: [name] Other Clinician: Referring Provider: Treating Provider/Extender: [name] in Treatment: 2 Verbal / Phone Orders: No Diagnosis Coding ICD-10 Coding Code Description [id number] Chronic venous hypertension (idiopathic) with ulcer and inflammation of left lower extremity L97.828 Non-pressure chronic ulcer of other part of left lower leg with other specified severity L97.328 Non-pressure chronic ulcer of left ankle with other specified severity Follow-up Appointments ppointment in 1 week. - with Margarita Grizzle - Puraply Return A Cellular or Tissue Based Products Cellular or Tissue Based Product Type: - PuraPly: #4 Bathing/ Shower/ Hygiene May shower with protection but do not get wound dressing(s) wet. Edema Control - Lymphedema /  SCD / Other Elevate legs to the level of the heart or above for 30 minutes daily and/or when sitting, a frequency of: - throughout the day Avoid standing for long periods of time. Exercise regularly Compression stocking or Garment 20-30 mm/Hg pressure to: - right leg daily Additional Orders / Instructions Follow Nutritious Diet Wound Treatment Wound #15 - Malleolus Wound Laterality: Left, Medial Cleanser: Soap and Water 1 x Per Week/30 Days Discharge Instructions: May shower and wash wound with dial antibacterial soap and water prior to dressing change. Peri-Wound Care: Triamcinolone 15 (g) 1 x Per Week/30 Days Discharge Instructions: Use triamcinolone 15 (g) mixed with lotion Peri-Wound Care: Sween Lotion (Moisturizing lotion) 1 x Per Week/30 Days Discharge Instructions: Apply moisturizing lotion as directed Prim Dressing: PuraPly AM ary 1 x Per Week/30 Days Secondary Dressing: Woven Gauze Sponge, Non-Sterile 4x4 in 1 x Per Week/30 Days Discharge Instructions: Apply over primary dressing as directed. Secondary Dressing: ABD Pad, 5x9 1 x Per Week/30 Days Discharge Instructions: Apply over primary dressing and lateral ankle Secondary Dressing: Optifoam Non-Adhesive Dressing, 4x4 in 1 x Per Week/30 Days Discharge Instructions: Apply over primary dressing pad lateral foot with foam donut Secondary Dressing: ADAPTIC TOUCH 3x4.25 in 1 x Per Week/30 Days Discharge Instructions: Apply over skin sub Compression Wrap: CoFlex TLC XL 2-layer Compression System 4x7 (in/yd) 1 x Per Week/30 Days Discharge Instructions: Apply CoFlex 2-layer compression as directed. (alt for 4 layer) Electronic Signature(s) Signed: 01/02/2021 5:22:07 PM By: [name] PA-C Signed: 01/02/2021 5:57:23 PM By: Baruch Gouty RN, BSN Entered By: Baruch Gouty on 01/02/2021 13:28:16 -------------------------------------------------------------------------------- Problem List Details Patient Name: Date of Service: [facility]  [name], [name] Kingfisher G. 01/02/2021 12:45 PM Medical Record Number: [id number] Patient Account Number: [id number] Date of Birth/Sex: Treating RN: 1956/05/02 (65 y.o. [name] Provider: [name] Other Clinician: Referring Provider: Treating Provider/Extender: [name]  Weeks in Treatment: 50 Active Problems ICD-10 Encounter Code Description Active Date MDM Diagnosis I87.332 Chronic venous hypertension (idiopathic) with ulcer and inflammation of left 01/17/2020 No Yes lower extremity L97.828 Non-pressure chronic ulcer of other part of left lower leg with other specified 01/17/2020 No Yes severity L97.328 Non-pressure chronic ulcer of left ankle with other specified severity 01/17/2020 No Yes Inactive Problems ICD-10 Code Description Active Date Inactive Date L03.116 Cellulitis of left lower limb 01/31/2020 01/31/2020 T81.31XD Disruption of external operation (surgical) wound, not elsewhere classified, subsequent 01/17/2020 01/17/2020 encounter Resolved Problems Electronic Signature(s) Signed: 01/02/2021 1:16:05 PM By: [name] PA-C Entered By: [name] on 01/02/2021 13:16:05 -------------------------------------------------------------------------------- Progress Note Details Patient Name: Date of Service: [facility] [name], [name] Kingfisher G. 01/02/2021 12:45 PM Medical Record Number: [id number] Patient Account Number: [id number] Date of Birth/Sex: Treating RN: [birth year] (65 y.o. [name] Provider: [name] Other Clinician: Referring Provider: Treating Provider/Extender: [name] in Treatment: 102 Subjective Chief Complaint Information obtained from Patient patient is been followed long-term in this clinic for venous insufficiency ulcers with inflammation, hypertension and ulceration over the medial ankle bilaterally. 01/17/2020; this is a patient who is here for review of postoperative wounds  on the left lateral ankle and recurrence of venous stasis ulceration on the left medial History of Present Illness (HPI) the remaining wound is over the left medial ankle. Similar wound over the right medial ankle healed largely with use of Apligraf. Most recently we have been using Hydrofera Blue over this wound with considerable improvement. The patient has been extensively worked up in the past for her venous insufficiency and she is not a candidate for antireflux surgery although I have none of the details available currently. 08/24/14; considerable improvement today. About 50% of this wound areas now epithelialized. The base of the wound appears to be healthier granulation.as opposed to last week when she had deteriorated a considerable improvement 08/17/14; unfortunately the wound has regressed somewhat. The areas of epithelialization from the superior aspect are not nearly as healthy as they were last week. The patient thinks her Hydrofera Blue slipped. 09/07/14; unfortunately the area has markedly regressed in the 2 weeks since I've seen this. There is an odor surrounding erythema. The healthy granulation tissue that we had at the base of the wound now is a dusky color. The nurse reports green drainage 09/14/14; the area looks somewhat better than last week. There is less erythema and less drainage. The culture I did did not show any growth. Nevertheless I think it is better to continue the Cipro and doxycycline for a further week. The remaining wound area was debridement. 09/21/14. Wound did not require debridement last week. Still less erythema and less drainage. She can complete her antibiotics. The areas of epithelialization in the superior aspect of the wound do not look as healthy as they did some weeks ago 10/05/14 continued improvement in the condition of this wound. There is advancing epithelialization. Less aggressive debridement required 10/19/14 continued improvement in the condition and  volume of this wound. Less aggressive debridement to the inferior part of this to remove surface slough and fibrinous eschar 11/02/14 no debridement is required. The surface granulation appears healthy although some of her islands of epithelialization seem to have regressed. No evidence of infection 11/16/14; lites surface debridement done of surface eschar. The wound does not look to be unhealthy. No evidence of infection. Unfortunately the patient has had podiatry issues in the right foot  and for some reason has redeveloped small surface ulcerations in the medial right ankle. Her original presentation involved wounds in this area 11/23/14 no debridement. The area on the right ankle has enlarged. The left ankle wound appears stable in terms of the surface although there is periwound inflammation. There has been regression in the amount of new skin 11/30/14 no debridement. Both wound areas appear healthy. There was no evidence of infection. The the new area on the right medial ankle has enlarged although that both the surfaces appear to be stable. 12/07/14; Debridement of the right medial ankle wound. No no debridement was done on the left. 12/14/14 no major change in and now bilateral medial ankle wounds. Both of these are very painful but the no overt evidence of infection. She has had previous venous ablation 12/21/14; patient states that her right medial ankle wound is considerably more painful last week than usual. Her left is also somewhat painful. She could not tolerate debridement. The right medial ankle wound has fibrinous surface eschar 12/28/14 this is a patient with severe bilateral venous insufficiency ulcers. For a considerable period of time we actually had the one on the right medial ankle healed however this recently opened up again in June. The left medial ankle wound has been a refractory area with some absent flows. We had some success with Hydrofera Blue on this area and it literally  closed by 50% however it is recently opened up Foley. Both of these were debridement today of surface eschar. She tolerates this poorly 01/25/15: No change in the status of this. Thick adherent escar. Very poor tolerance of any attempt at debridement. I had healed the right medial malleolus wound for a considerable amount of time and had the left one down to about 50% of the volume although this is totally regressed over the last 48 weeks. Further the right leg has reopened. she is trying to make a appointment with pain and vascular, previous ablations with Dr. Aleda Grana. I do not believe there is an arterial insufficiency issue here 02/01/15 the status of the adherent eschar bilaterally is actually improved. No debridement was done. She did not manage to get vascular studies done 02/08/15 continued debridement of the area was done today. The slough is less adherent and comes off with less pressure. There is no surrounding infection peripheral pulses are intact 02/15/15 selective debridement with a disposable curette. Again the slough is less adherent and comes off with less difficulty. No surrounding infection peripheral pulses are intact. 02/22/15 selective debridement of the right medial ankle wound. Slough comes off with less difficulty. No obvious surrounding infection peripheral pulses are intact I did not debridement the one on the left. Both of these are stable to improved 03/01/15 selective debridement of both wound areas using a curette to. Adherent slough cup soft with less difficulty. No obvious surrounding infection. The patient tells me that 2 days ago she noted a rash above the right leg wrap. She did not have this on her lower legs when she change this over she arrives with widespread left greater than right almost folliculitis-looking rash which is extremely pruritic. I don't see anything to culture here. There is no rash on the rest of her body. She feels well systemically. 03/08/15;  selective debridement of both wounds using a curette. Base of this does not look unhealthy. She had limegreen drainage coming out of the left leg wound and describes a lot of drainage. The rash on her left leg looks improved  to. No cultures were done. 03/22/15; patient was not here last week. Basal wounds does not look healthy and there is no surrounding erythema. No drainage. There is still a rash on the left leg that almost looks vasculitic however it is clearly limited to the top of where the wrap would be. 04/05/15; on the right required a surgical debridement of surface eschar and necrotic subcutaneous tissue. I did not debridement the area on the left. These continue to be large open wounds that are not changing that much. We were successful at one point in healing the area on the right, and at the same time the area on the left was roughly half the size of current measurements. I think a lot of the deterioration has to do with the prolonged time the patient is on her feet at work 04/19/15 I attempted-like surface debridement bilaterally she does not tolerate this. She tells me that she was in allergic care yesterday with extreme pain over her left lateral malleolus/ankle and was told that she has an "sprain" 05/03/15; large bilateral venous insufficiency wounds over the medial malleolus/medial aspect of her ankles. She complains of copious amounts of drainage and his usual large amounts of pain. There is some increasing erythema around the wound on the right extending into the medial aspect of her foot to. historically she came in with these wounds the right one healed and the left one came down to roughly half its current size however the right one is reopened and the left is expanded. 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 05/10/15 large bilateral venous insufficiency wounds. There is less adherence surface left however the surface culture  that I did last week grew pseudomonas therefore bilateral selective debridement score necessary. There is surrounding erythema. The patient describes severe bilateral drainage and a lot of pain in the left ankle. Apparently her podiatrist was were ready to do a cortisone shot 05/17/15; the patient complains of pain and again copious amounts of drainage. 05/24/15; we used Iodo flex last week. Patient notes considerable improvement in wound drainage. Only needed to change this once. 05/31/15; we continued Iodoflex; the base of these large wounds bilaterally is not too bad but there is probably likely a significant bioburden here. I would like to debridement just doesn't tolerate it. 06/06/14 I would like to continue the Iodoflex although she still hasn't managed to obtain supplies. She has bilateral medial malleoli or large wounds which are mostly superficial. Both of them are covered circumferentially with some nonviable fibrinous slough although she tolerates debridement very poorly. She apparently has an appointment for an ablation on the right leg by interventional radiology. 06/14/15; the patient arrives with the wounds and static condition. We attempted a debridement although she does not do well with this secondary to pain. I 07/05/15; wounds are not much smaller however there appears to be a cleaner granulating base. The left has tight fibrinous slough greater than the right. Debridement is tolerated poorly due to pain. Iodoflex is done more for these wounds in any of the multitude of different dressings I have tried on the left 1 and then subsequently the right. 07/12/15; no change in the condition of this wound. I am able to do an aggressive debridement on the right but not the left. She simply cannot tolerate it. We have been using Iodoflex which helps somewhat. It is worthwhile remembering that at one point we healed  the right medial ankle wound and the left was about 25% of the current  circumference. We have suggested returning to vascular surgery for review of possible further ablations for one reason or another she has not been able to do this. 07/26/15 no major change in the condition of either wound on her medial ankle. I did not attempt to debridement of these. She has been aggressively scrubbing these while she is in the shower at home. She has her supply of Iodoflex which seems to have done more for these wounds then anything I have put on recently. 08/09/15 wound area appears larger although not verified by measurements. Using Iodoflex 09/05/2015 -- she was here for avisit today but had significant problems with the wound and I was asked to see her for a physician opinion. I have summarize that this lady has had surgery on her left lower extremity about 10 years ago where the possible veins stripping was done. She has had an opinion from interventional radiology around November 2016 where no further sclerotherapy was ordered. The patient works 12 hours a day and stands on a concrete floor with work boots and is unable to get the proper compression she requires and cannot elevate her limbs appropriately at any given time. She has recently grown Pseudomonas from her wound culture but has not started her ciprofloxacin which was called in for her. 09/13/15 this continues to be a difficult situation for this patient. At one point I had this wound down to a 1.5 x 1.5" wound on her left leg. This is deteriorated and the right leg has reopened. She now has substantial wounds on her medial calcaneus, malleoli and into her lower leg. One on the left has surface eschar but these are far too painful for me to debridement here. She has a vascular surgery appointment next week to see if anything can be done to help here. I think she has had previous ablations several years ago at [facility]. She has no major edema. She tells me that she did not get product last time [facility] Ag] and went  for several days without it. She continues to work in work boots 12 hours a day. She cannot get compression/4-layer under her work boots. 09/20/15 no major change. Periwound edema control was not very good. Her point with pain and vascular is next Wednesday the 25th 09/28/15; the patient is seen vascular surgery and is apparently scheduled for repeat duplex ultrasounds of her bilateral lower legs next week. 10/05/15; the patient was seen by Dr. Doren Custard of vascular surgery. He feels that she should have arterial insufficiency excluded as cause/contributed to her nonhealing stage she is therefore booked for an arteriogram. She has apparently monophasic signals in the dorsalis pedis pulses. She also of course has known severe chronic venous insufficiency with previous procedures as noted previously. I had another long discussion with the patient today about her continuing to work 12 hour shifts. I've written her out for 2 months area had concerns about this as her work location is currently undergoing significant turmoil and this may lead to her termination. She is aware of this however I agree with her that she simply cannot continue to stand for 12 hours multiple days a week with the substantial wound areas she has. 10/19/15; the Dr. Doren Custard appointment was largely for an arteriogram which was normal. She does not have an arterial issue. He didn't make a comment about her chronic venous insufficiency for which she has had previous ablations.  Presumably it was not felt that anything additional could be done. The patient is now out of work as I prescribed 2 weeks ago. Her wounds look somewhat less aggravated presumably because of this. I felt I would give debridement another try today 10/25/15; no major change in this patient's wounds. We are struggling to get her product that she can afford into her own home through her insurance. 11/01/15; no major change in the patient's wounds. I have been using silver alginate  as the most affordable product. I spoke to Dr. Marla Roe last week with her requested take her to the OR for surgical debridement and placement of ACEL. Dr. Marla Roe told me that she would be willing to do this however [facility] will not cover this, fortunately the patient has [place] healthcare of some variant 11/08/15; no major change in the patient's wounds. She has been completely nonviable surface that this but is in too much pain with any attempted debridement are clinic. I have arranged for her to see Dr. Marla Roe ham of plastic surgery and this appointment is on Monday. I am hopeful that they will take her to the OR for debridement, possible ACEL ultimately possible skin graft 11/22/15 no major change in the patient's wounds over her bilateral medial calcaneus medial malleolus into the lower legs. Surface on these does not look too bad however on the left there is surrounding erythema and tenderness. This may be cellulitis or could him sleepy tinea. 11/29/15; no major changes in the patient's wounds over her bilateral medial malleolus. There is no infection here and I don't think any additional antibiotics are necessary. There is now plan to move forward. She sees Dr. Marla Roe in a week's time for preparation for operative debridement and ACEL placement I believe on 7/12. She then has a follow-up appointment with Dr. Marla Roe on 7/21 12/28/15; the patient returns today having been taken to the [place] by Dr. Marla Roe 12/12/15 she underwent debridement, intraoperative cultures [which were negative]. She had placement of a wound VAC. Parent really ACEL was not available to be placed. The wound VAC foam apparently adhered to the wound since then she's been using silver alginate, Xeroform under Ace wraps. She still says there is a lot of drainage and a lot of pain 01/31/16; this is a patient I see monthly. I had referred her to Dr. Marla Roe him of plastic surgery for large wounds on  her bilateral medial ankles. She has been to the OR twice once in early July and once in early August. She tells me over the last 3 weeks she has been using the wound VAC with ACEL underneath it. On the right we've simply been using silver alginate. Under Kerlix Coban wraps. 02/28/16; this is a patient I'm currently seeing monthly. She is gone on to have a skin graft over her large venous insufficiency ulcer on the left medial ankle. This was done by Dr. Marla Roe him. The patient is a bit perturbed about why she didn't have one on her right medial ankle wound. She has been using silver alginate to this. 03/06/16; I received a phone call from her plastic surgery Dr. Marla Roe. She expressed some concern about the viability of the skin graft she did on the left medial ankle wound. Asked me to place Endoform on this. She told me she is not planning to do a subsequent skin graft on the right as the left one did not take very well. I had placed Hydrofera Blue on the right  03/13/16; continue to have a reasonably healthy wound on the right medial ankle. Down to 3 mm in terms of size. There is epithelialization here. The area on the left medial ankle is her skin graft site. I suppose the last week this looks somewhat better. She has an open area inferiorly however in the center there appears to be some viable tissue. There is a lot of surface callus and eschar that will eventually need to come off however none of this looked to be infected. Patient states that the is able to keep the dressing on for several days which is an improvement. 03/20/16 no major change in the circumference of either wound however on the left side the patient was at Dr. Eusebio Friendly office and they did a debridement of left wound. 50% of the wound seems to be epithelialized. I been using Endoform on the left Hydrofera Blue in the right 03/27/16; she arrives today with her wound is not looking as healthy as they did last week. The  area on the right clearly has an adherent surface to this a very similar surface on the left. Unfortunately for this patient this is all too familiar problem. Clearly the Endoform is not working and will need to change that today that has some potential to help this surface. She does not tolerate debridement in this clinic very well. She is changing the dressing wants 04/03/16; patient arrives with the wounds looking somewhat better especially on the right. Dr. Migdalia Dk change the dressing to silver alginate when she saw her on Monday and also sold her some compression socks. The usefulness of the latter is really not clear and woman with severely draining wounds. 04/10/16; the patient is doing a bit of an experiment wearing the compression stockings that Dr. Migdalia Dk provided her to her left leg and the out of legs based dressings that we provided to the right. 05/01/16; the patient is continuing to wear compression stockings Dr. Migdalia Dk provided her on the left that are apparently silver impregnated. She has been using Iodoflex to the right leg wound. Still a moderate amount of drainage, when she leaves here the wraps only last for 4 days. She has to change the stocking on the left leg every night 05/15/16; she is now using compression stockings bilaterally provided by Dr. Marla Roe. She is wearing a nonadherent layer over the wounds so really I don't think there is anything specific being done to this now. She has some reduction on the left wound. The right is stable. I think all healing here is being done without a specific dressing 06/09/16; patient arrives here today with not much change in the wound certainly in diameter to large circular wounds over the medial aspect of her ankle bilaterally. Under the light of these services are certainly not viable for healing. There is no evidence of surrounding infection. She is wearing compression stockings with some sort of silver impregnation as prescribed by  Dr. Marla Roe. She has a follow-up with her tomorrow. 06/30/16; no major change in the size or condition of her wounds. These are still probably covered with a nonviable surface. She is using only her purchase stockings. She did see Dr. Marla Roe who seemed to want to apply Dakin's solution to this I'm not extreme short what value this would be. I would suggest Iodoflex which she still has at home. 07/28/16; I follow [name] episodically along with Dr. Marla Roe. 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 She has been applying a topical collagen ointment to both wounds with Adaptic. I don't think Dr. Marla Roe is planning to take her back to the OR. 08/19/16; I follow [name] on a monthly basis along with Dr. Marla Roe of plastic surgery. 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 I been following her for a number of years. At one point I was able to get the right medial malleolus wound to heal and had the left medial malleolus down to about half its current size however and I had to send her to plastic surgery for an operative debridement. Since then things have been stable to slightly improve the area on the right is slightly better one in the left about the same although there is much less adherent surface than I'm used to with this patient. She is using some form of liquid collagen gel that Dr. Marla Roe provided a Kerlix cover with the patient's own pressure stockings. She tells me that she has extreme pain in both ankles and along the lateral aspect of both feet. She has been unable to work for some period of time. She is telling me she is retiring at the beginning of April. She sees Dr. Doran Durand of orthopedics next week 09/22/16; patient has not seen Dr. Marla Roe since the last time she is here. I'm not really sure what she is using to the wounds other than  bits and pieces of think she had left over including most recently Hydrofera Blue. She is using juxtalite stockings. She is having difficulty with her husband's recent illness "stroke". She is having to transport him to various doctors appointments. Dr. Marla Roe left her the option of a repeat debridement with ACEL however she has not been able to get the time to follow-up on this. She continues to have a fair amount of drainage out of these wounds with certainly precludes leaving dressings on all week 10/13/16; patient has not seen Dr. Marla Roe since she was last in our clinic. I'm not really sure what she is doing with the wounds, we did try to get her [facility] and I think she is actually using this most of the time. Because of drainage she states she has to change this every second day although this is an improvement from what she used to do. She went to see Dr. Doran Durand who did not think she had a muscular issue with regards to her feet, he referred her to a neurologist and I think the appointment is sometime in June. I changed her back to Iodoflex which she has used in the past but not recently. 11/03/16; the patient has been using Iodoflex although she ran out of this. Still claims that there is a lot of drainage although the wound does not look like this. No surrounding erythema. She has not been back to see Dr. Marla Roe 11/24/16; the patient has been using Iodoflex again but she ran out of it 2 or 3 days ago. There is no major change in the condition of either one of these wounds in fact they are larger and covered in a thick adherent surface slough/nonviable tissue especially on the left. She does not tolerate mechanical debridement in our clinic. Going back to see Dr. Marla Roe of plastic surgery for an operative debridement would seem reasonable. 12/15/16; the patient has not been back to see Dr. Marla Roe. She is been dealing with a series of illnesses and her husband which of  monopolized her time. She is been using Sorbact which we largely supplied. She states the drainage is bad enough that it maximum she can go 2-3 days without changing the dressing 01/12/2017 -- the patient has not been back for about 4 weeks and has not seen Dr. Marla Roe not does she have any appointment pending. 01/23/17; patient has not seen Dr. Marla Roe even though I suggested this previously. She is using Santyl that was suggested last week by Dr. Con Memos this Cost her $16 through her insurance which is indeed surprising 02/12/17; continuing Santyl and the patient is changing this daily. A lot of drainage. She has not been back to see plastic surgery she is using an Ace wrap. Our intake nurse suggested wrap around stockings which would make a good reasonable alternative 02/26/17; patient is been using Santyl and changing this daily due to drainage. She has not been to see plastic surgery she uses in April Ace wrap to control the edema. She did obtain extremitease stockings but stated that the edema in her leg was to big for these 03/20/17; patient is using Santyl and Anasept. Surfaces looked better today the area on the right is actually measuring a little smaller. She has states she has a lot of pain in her feet and ankles and is asking for a consult to pain control which I'll try to help her with through our case manager. 04/10/17; the patient arrives with better-looking wound surfaces and is slightly smaller wound on the left she is using a combination of Santyl and Anasept. She has an appointment or at least as started in the pain control center associated with [facility] 05/14/17; this is a patient who I followed for a prolonged period of time. She has venous insufficiency ulcers on her bilateral medial ankles. At one point I had this down to a much smaller wound on the left however these reopened and we've never been able to get these to heal. She has been using Santyl and Anasept gel  although 2 weeks ago she ran out of the Anasept gel. She has a stable appearance of the wound. She is going to the wound care clinic at [facility]. They wanted do a nerve block/spinal block although she tells me she is reluctant to go forward with that. 05/21/17; this is a patient I have followed for many years. She has venous insufficiency ulcers on her bilateral medial ankles. Chronic pain and deformity in her ankles as well. She is been to see plastic surgery as well as orthopedics. Using PolyMem AG most recently/Kerramax/ABDs and 2 layer compression. She has managed to keep this on and she is coming in for a nurse check to change the dressing on Tuesdays, we see her on Fridays 06/05/17; really quite a good looking surface and the area especially on the right medial has contracted in terms of dimensions. Well granulated healthy-looking tissue on both sides. Even with an open curet there is nothing that even feels abnormal here. This is as good as I've seen this in quite some time. We have been using PolyMem AG and bringing her in for a nurse check 06/12/17; really quite good surface on both of these wounds. The right medial has contracted a bit left is not. We've been using PolyMem and AG and she is coming in for a nurse visit 06/19/17; we have been using PolyMem AG and bringing her in for a nurse check. Dimensions of her wounds are not better but the surfaces looked better bilaterally.  She complained of bleeding last night and the left wound and increasing pain bilaterally. She states her wound pain is more neuropathic than just the wounds. There was some suggestion that this was radicular from her pain management doctor in talking to her it is really difficult to sort this out. 06/26/17; using PolyMem and AG and bringing her in for a nurse check as All of this and reasonably stable condition. Certainly not improved. The dimensions on the lateral part of the right leg look better but not really  measuring better. The medial aspect on the left is about the same. 07/03/16; we have been using PolyMen AG and bringing her in for a nurse check to change the dressings as the wounds have drainage which precludes once weekly changing. We are using all secondary absorptive dressings.our intake nurse is brought up the idea of using a wound VAC/snap VAC on the wound to help with the drainage to see if this would result in some contraction. This is not a bad idea. The area on the right medial is actually looking smaller. Both wounds have a reasonable-looking surface. There is no evidence of cellulitis. The edema is well controlled 07/10/17; the patient was denied for a snap VAC by her insurance. The major issue with these wounds continues to be drainage. We are using wicked PolyMem AG and she is coming in for a nurse visit to change this. The wounds are stable to slightly improved. The surface looks vibrant and the area on the right certainly has shrunk in size but very slowly 07/17/17; the patient still has large wounds on her bilateral medial malleoli. Surface of both of these wounds looks better. The dimensions seem to come and go but no consistent improvement. There is no epithelialization. We do not have options for advanced treatment products due to insurance issues. They did not approve of the wound VAC to help control the drainage. More recently we've been using PolyMem and AG wicked to allow drainage through. We have been bringing her in for a nurse visit to change this. We do not have a lot of options for wound care products and the home again due to insurance issues 07/24/17; the patient's wound actually looks somewhat better today. No drainage measurements are smaller still healthy-looking surface. We used silver collagen under PolyMen started last week. We have been bringing her in for a dressing change 07/31/17; patient's wound surface continued to look better and I think there is visible change in  the dimensions of the wound on the right. Rims of epithelialization. We have been using silver collagen under PolyMen and bringing her in for a dressing change. There appears to be less drainage although she is still in need of the dressing change 08/07/17. Patient's wound surface continues to look better on both sides and the area on the right is definitely smaller. We have been using silver collagen and PolyMen. She feels that the drainage has been it has been better. I asked her about her vascular status. She went to see Dr. Aleda Grana at [facility] and had some form of ablation. I don't have much detail on this. I haven't my notes from 2016 that she was not a candidate for any further ablation but I don't have any more information on this. We had referred her to vein and vascular I don't think she ever went. He does not have a history of PAD although I don't have any information on this either. We don't even have ABIs in  our record 08/14/17; we've been using silver collagen and PolyMen cover. And putting the patient and compression. She we are bringing her in as a nurse visit to change this because ofarge amount of drainage. We didn't the ABIs in clinic today since they had been done in many moons 1.2 bilaterally. She has been to see vein and vascular however this was at [facility] and she had ablation although I really don't have any information on this all seemed biking get a report. She is also been operatively debrided by plastic surgery and had a cell placed probably 8-12 months ago. This didn't have a major effect. We've been making some gains with current dressings 08/19/17-She is here in follow-up evaluation for bilateral medial malleoli ulcers. She continues to tolerate debridement very poorly. We will continue with recently changed topical treatment; if no significant improvement may consider switching to Iodosorb/Iodoflex. 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 She has been using silver collagen and PolyMem. I believe she has been used and tried on Iodoflex before. 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 These have been very refractory over time. We have been using silver collagen and PolyMen. She is coming in once a week for a doctor's and once a week for nurse visits. We are actually making some progress 09/18/17; the patient's wounds are smaller especially on the right medial. She arrives today to upset to consider even washing these off with Anasept which I think is been part of the reason this is been closing. We've been using collagen covered in PolyMen otherwise. It is noted that she has a small area of folliculitis on the right medial calf that. As we are wrapping her legs I'll give her a short course of doxycycline to make sure this doesn't amount to anything. She is a long list of complaints today including imbalance, shortness of breath on exertion, inversion of her left ankle. With regards to the latter complaints she is been to see orthopedics and they offered her a tendon release surgery I believe but wanted her wounds to be closed first. I have recommended she go see her primary physician with regards to everything else. 09/25/17; patient's wounds are about the same size. We have made some progress bilaterally although not in recent weeks. She will not allow me T wash these wounds with Anasept even if she is doing her cell. Wheeze we've been using collagen covered in PolyMen. Last week she had a small area of folliculitis this is now opened into a small wound. She completed 5 days of trimethoprim sulfamethoxazole 10/02/17; unfortunately the area on her left medial ankle is worse with a larger wound area towards the  Achilles. The patient complains of a lot of pain. She will not allow debridement although visually I don't think there is anything to debridement in any case. We have been using silver collagen and PolyMen for several months now. Initially we are making some progress although I'm not really seeing that today. We will move back to [facility]. His admittedly this is a bit of a repeat however I'm hoping that his situation is different now. The patient tells me she had her leg on the left give out on her yesterday this is process some pain. 10/09/17; the patient is  seen twice a week largely because of drainage issues coming out of the chronic medial bimalleolar wounds that are chronic. Last week the dimensions of the one on the left looks a little larger I changed her to [facility]. She comes in today with a history of terrible pain in the bilateral wound areas. She will not allow debridement. She will not even allow a tissue culture. There is no surrounding erythema no no evidence of cellulitis. We have been putting her Kerlix Coban man. She will not allow more aggressive compression as there was a suggestion to put her in 3 layer wraps. 10/16/17; large wounds on her bilateral medial malleoli. These are chronic. Not much change from last week. The surface looks have healthy but absolutely no epithelialization. A lot of pain little less so of drainage. She will not allow debridement or even washing these off in the vigorous fashion with Anasept. 10/23/17; large wounds on her bilateral malleoli which are chronic. Some improvement in terms of size perhaps on the right since last time I saw these. She states that after we increased the 3 layer compression there was some bleeding, when she came in for a nurse visit she did not want 3 layer compression put back on about our nurse managed to convince her. She has known chronic venous visit issues and I'm hoping to get her to tolerate the 3 layer  compression. using Hydrofera Blue 10/30/17; absolutely no change in the condition of either wound although we've had some improvement in dimensions on the right.. Attempted to put her in 3 layer compression she didn't tolerated she is back in 2 layer compression. We've been using Hydrofera Blue We looked over her past records. She had venous reflux studies in November 2016. There was no evidence of deep venous reflux on the right. Superficial vein did not show the greater saphenous vein at think this is been previously ablated the small saphenous vein was within normal limits. The left deep venous system showed no DVT the vessels were positive for deep venous reflux in the posterior tibial veins at the ankle. The greater saphenous vein was surgically absent small saphenous vein was within normal limits. She went to vein and vascular at [facility]. I believe she had an ablation on the left greater saphenous vein. I'll update her reflux studies perhaps ever reviewed by vein and vascular. We've made absolutely no progress in these wounds. Will also try to read and TheraSkins through her insurance 11/06/17; W the patient apparently has a 2 week follow-up with vein and vascular I like him to review the whole issue with regards to her previous vascular workup by Dr. Aleda Grana. We've really made no progress on these wounds in many months. She arrives today with less viable looking surface on the left medial ankle wound. This was apparently looking about the same on Tuesday when she was here for nurse visit. 11/13/17; deep tissue culture I did last time of the left lower leg showed multiple organisms without any predominating. In particular no Staphylococcus or group A strep were isolated. We sent her for venous reflux studies. She's had a previous left greater saphenous vein stripping and I think sclerotherapy of the right greater saphenous vein. She didn't really look at the lesser saphenous vein this  both wounds are on the medial aspect. She has reflux in the common femoral vein and popliteal vein and an accessory vein on the right and the common femoral vein and popliteal vein on the left.  I'm going to have her go to see vein and vascular just the look over things and see if anything else beside aggressive compression is indicated here. We have not been able to make any progress on these wounds in spite of the fact that the surface of the wounds is never look too bad. 11/20/17; no major change in the condition of the wounds. Patient reports a large amount of drainage. She has a lot of complaints of pain although enlisting her today I wonder if some of this at least his neuropathic rather than secondary to her wounds. She has an appointment with vein and vascular on 12/30/17. The refractory nature of these wounds in my mind at least need vein and vascular to look over the wounds the recent reflux studies we did and her history to see if anything further can be done here. I also note her gait is deteriorated quite a bit. 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 She has a bilateral Trendelenburg gait. I wonder if this is neuropathic or perhaps multilevel radicular. 11/27/17; her wounds actually looks slightly better. Healthy-looking granulation tissue a scant amount of epithelialization. [place] healthcare will not pay for [employer]. They will play for tri layer Oasis and Dermagraft. This is not a diabetic ulcer. We'll try for the tri layer Oasis. She still complains of some drainage. She has a vein and vascular appointment on 12/30/17 12/04/17; the wounds visually look quite good. Healthy-looking granulation with some degree of epithelialization. We are still waiting for response to our request for trial to try layer Oasis. Her appointment with vascular to review venous and arterial issues isn't sold the end of July 7/31. Not allow debridement or even vigorous cleansing of the wound  surface. 12/18/17; slightly smaller especially on the right. 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 We've been using Hydrofera Blue. We still are looking into triple layer Oasis through her insurance 01/08/18 on evaluation today patient's wound actually appears to be showing signs of good improvement at this point in time. She has been tolerating the dressing changes without complication. Fortunately there does not appear to be any evidence of infection at this point in time. We have been utilizing silver nitrate which does seem to be of benefit for her which is also good news. Overall I'm very happy with how things seem to be both regards appearance as well as measurement. Patient did see Dr. Bridgett Larsson for evaluation on 12/30/17. In his assessment he felt that stripping would not likely add much more than chronic compression to the patient's healing process. His recommendation was to follow-up in three months with Dr. Doren Custard if she hasn't healed in order to consider referral back to you and see vascular where she previously was in a trial and was able to get her wound to heal. I'll be see what she feels she when you staying compression and he reiterated this as well. 01/13/18 on evaluation today patient appears to actually be doing very well in regard to her bilateral medial malleolus ulcers. She seems to have tolerated the chemical cauterization with silver nitrate last week she did have some pain through that evening but fortunately states that I'll be see since it seems to be doing better she is overall pleased with the progress. 01/21/18; really quite a remarkable improvement since I've last seen these wounds. We started using silver nitrate specially on the islands of hyper granulation which for some reason her around the wound circumference. This  is really done quite nicely. Primary dressing Hydrofera Blue under 4 layer compression. She seems to be able to hold out without a  nurse rewrap. Follow-up in 1 week 01/28/18; we've continued the hydrofera blue but continued with chemical cauterization to the wound area that we started about a month ago for irregular hyper granulation. She is made almost stunning improvement in the overall wound dimensions. I was not really expecting this degree of improvement in these chronic wounds 02/05/18; we continue with Hydrofera Bluebut of also continued the aggressive chemical cauterization with silver nitrate. We made nice progress with the right greater than left wound. 02/12/18. We continued with Hydrofera Blue after aggressive chemical cauterization with silver nitrate. We appear to be making nice progress with both wound areas 02/19/2018; we continue with [facility] after washing the wounds vigorously with Anasept spray and chemical cauterization with silver nitrate. We are making excellent progress. The area on the right's just about closed 02/26/2018. The area on the left medial ankle had too much necrotic debris today. I used a #5 curette we are able to get most of the soft. I continued with the silver nitrate to the much smaller wound on the right medial ankle she had a new area on her right lower pretibial area which she says was due to a role in her compression 03/05/2018; both wound areas look healthy. Not much change in dimensions from last week. I continue to use silver nitrate and Hydrofera Blue. The patient saw Dr. Doren Custard of vein and vascular. He felt she had venous stasis ulcers. He felt based on her previous arteriogram she should have adequate circulation for healing. Also she has deep venous reflux but really no significant correctable superficial venous reflux at this time. He felt we should continue with conservative management including leg elevation and compression 04/02/2018; since we last saw this woman about a month ago she had a fall apparently suffered a pelvic fracture. I did not look up the x-ray.  Nevertheless because of pain she literally was bedbound for 2 weeks and had home health coming out to change the dressing. Somewhat predictably this is resulted in considerable improvement in both wound areas. The right is just about closed on the medial malleolus and the left is about half the size. 04/16/2018; both her wounds continue to go down in size. Using Hydrofera Blue. 05/07/18; both her wounds appeared to be improving especially on the right where it is almost closed. We are using Hydrofera Blue 05/14/2018; slightly worse this week with larger wounds. Surface on the left medial not quite as good. We have been using Hydrofera Blue 05/21/18; again the wounds are slightly larger. Left medial malleolus slightly larger with eschar around the circumference. We have been using Hydrofera Blue undergoing a wraps for a prolonged period of time. This got a lot better when she was more recumbent due to a fall and a back injury. I change the primary dressing the silver alginate today. She did not tolerate a 4 layer compression previously although I may need to bring this up with her next time 05/28/2018; area on the left medial malleolus again is slightly larger with more drainage. Area on the right is roughly unchanged. She has a small area of folliculitis on the right medial just on the lower calf. This does not look ominous. 06/03/2018 left medial malleolus slightly smaller in a better looking surface. We used silver nitrate on this last time with silver alginate. The area on the right appears  slightly smaller 1/10; left medial malleolus slightly smaller. Small open area on the right. We used silver nitrate and silver alginate as of 2 weeks ago. We continue with the wound and compression. These got a lot better when she was off her feet 1/17; right medial malleolus wound is smaller. The left may be slightly smaller. Both surfaces look somewhat better. 1/24; both wounds are slightly smaller. Using silver  alginate under Unna boots 1/31; both wounds appear smaller in fact the area on the right medial is just about closed. Surface eschar. We have been using silver alginate under Unna boots. The patient is less active now spends let much less time on her feet and I think this is contributed to the general improvement in the wound condition 2/7; both wounds appear smaller. I was hopeful the right medial would be closed however there there is still the same small open area. Slight amount of surface eschar on the left the dimensions are smaller there is eschar but the wound edges appear to be free. We have been using silver alginate under Unna boot's 2/14; both wounds once again measure smaller. Circumferential eschar on the left medial. We have been using silver alginate under Unna boots with gradual improvement 2/21; the area on the right medial malleolus has healed. The area on the left is smaller. We have been using silver alginate and Unna boots. We can discharge wrapping the right leg she has 20/30 stockings at home she will need to protect the scar tissue in this area 2/28; the area on the right medial malleolus remains closed the patient has a compression stocking. The area on the left is smaller. We have been using silver alginate and Unna boots. 3/6 the area on the right medial ankle remains closed. Good edema control noted she is using her own compression stocking. The area on the left medial ankle is smaller. We have been managing this with silver alginate and Unna boots which we will continue today. 3/13; the area on the right medial ankle remains closed and I'm declaring it healed today. When necessary the left is about the same still a healthy-looking surface but no major change and wound area. No evidence of infection and using silver alginate under unna and generally making considerable improvement 3/27 the area on the right medial ankle remains closed the area on the left is about the same  as last week. Certainly not any worse we have been using silver alginate under an Unna boot 4/3; the area on the right medial ankle remains closed per the patient. We did not look at this wound. The wound on the left medial ankle is about the same surface looks healthy we have been using silver alginate under an Unna boot 4/10; area on the right medial ankle remains closed per the patient. We did not look at this wound. The wound on the left medial ankle is slightly larger. The patient complains that the [facility] caused burning pain all week. She also told us that she was a lot more active this week. Changed her back to silver alginate 4/17; right medial ankle still closed per the patient. Left medial ankle is slightly larger. Using silver alginate. She did not tolerate Hydrofera Blue on this area 4/24; right medial ankle remains closed we have not look at this. The left medial ankle continues to get larger today by about a centimeter. We have been using silver alginate under Unna boots. She complains about 4 layer compression as an  alternative. She has been up on her feet working on her garden 5/8; right medial ankle remains closed we did not look at this. The left medial ankle has increased in size about 100%. We have been using silver alginate under Unna boots. She noted increased pain this week and was not surprised that the wound is deteriorated 5/15; no major change in SA however much less erythema ( one week of doxy ocellulitis). 5/22-65 year old female returns at 1 week to the clinic for left medial ankle wound for which we have been using silver alginate under 3 layer compression She was placed on DOXY at last visit - the wound is wider at this visit. She is in 3 layer compression 5/29; change to [facility] last week. I had given her empiric doxycycline 2 weeks ago for a week. She is in 3 layer compression. She complains of a lot of pain and drainage on presentation today. 6/5;  using Hydrofera Blue. I gave her doxycycline recently empirically for erythema and pain around the wound. Believe her cultures showed enterococcus which not would not have been well covered by doxycycline nevertheless the wound looks better and I don't feel specifically that the enterococcus needs to be covered. She has a new what looks like a wrap injury on her lateral left ankle. 6/12; she is using Hydrofera Blue. She has a new area on the left anterior lower tibial area. This was a wrap injury last week. 6/19; the patient is using Hydrofera Blue. She arrived with marked inflammation and erythema around the wound and tenderness. 12/01/18 on evaluation today patient appears to be doing a little bit better based on what I'm hearing from the standpoint of lassos evaluation to this as far as the overall appearance of the wound is concerned. Then sometime substandard she typically sees Dr. Dellia Nims. Nonetheless overall very pleased with the progress that she's made up to this point. No fevers, chills, nausea, or vomiting noted at this time. 7/10; some improvement in the surface area. Aggressively debrided last week apparently. I went ahead with the debridement today although the patient does not tolerate this very well. We have been using Iodoflex. Still a fair amount of drainage 7/17; slightly smaller. Using Iodoflex. 7/24; no change from last week in terms of surface area. We have been using Iodoflex. Surface looks and continues to look somewhat better 7/31; surface area slightly smaller better looking surface. We have been using Iodoflex. This is under Unna boot compression 8/7-Patient presents at 1 week with Unna boot and Iodoflex, wound appears better 8/14-Patient presents at 1 week with Iodoflex, we use the Unna boot, wound appears to be stable better.Patient is getting Botox treatment for the inversion of the foot for tendon release, Next week 8/21; we are using Iodoflex. Unna boot. The wound is  stable in terms of surface area. Under illumination there is some areas of the wound that appear to be either epithelialized or perhaps this is adherent slough at this point I was not really clear. It did not wipe off and I was reluctant to debride this today. 8/28; we are using Iodoflex in an Unna boot. Seems to be making good improvement. 9/4; using Iodoflex and wound is slightly smaller. 9/18; we are using Iodoflex with topical silver nitrate when she is here. The wound continues to be smaller 10/2; patient missed her appointment last week due to GI issues. She left and Iodoflex based dressing on for 2 weeks. Wound is about the same size about the  size of a dime on the left medial lower 10/9 we have been using Iodoflex on the medial left ankle wound. She has a new superficial probable wrap injury on the dorsal left ankle 10/16; we have been using Hydrofera Blue since last week. This is on the left medial ankle 10/23; we have been using Hydrofera Blue since 2 weeks ago. This is on the left medial ankle. Dimensions are better 11/6; using Hydrofera Blue. I think the wound is smaller but still not closed. Left medial ankle 11/13; we have been using Hydrofera Blue. Wound is certainly no smaller this week. Also the surface not as good. This is the remanent of a very large area on her left medial ankle. 11/20; using Sorbact since last week. Wound was about the same in terms of size although I was disappointed about the surface debris 12/11; 3-week follow-up. Patient was on vacation. Wound is measuring slightly larger we have been using Sorbact. 12/18; wound is about the same size however surface looks better last week after debridement. We have been using Sorbact under compression 1/15 wound is probably twice the size of last time increased in length nonviable surface. We have been using Sorbact. She was running a mild fever and missed her appointment last week 1/22; the wound is come down in size but  under illumination still a very adherent debris we have been Hydrofera Blue that I changed her to last week 1/29; dimensions down slightly. We have been using Hydrofera Blue 2/19 dimensions are the same however there is rims of epithelialization under illumination. Therefore more the surface area may be epithelialized 2/26; the patient's wound actually measures smaller. The wound looks healthy. We have been using Hydrofera Blue. I had some thoughts about running Apligraf then I still may do that however this looks so much better this week we will delay that for now 3/5; the wound is small but about the same as last week. We have been using Hydrofera Blue. No debridement is required today. 3/19; the wound is about the size of a dime. Healthy looking wound even under illumination. We have been using Hydrofera Blue. No mechanical debridement is necessary 3/26; not much change from last week although still looks very healthy. We have been using Hydrofera Blue under Unna boots Patient was offered an ankle fusion by podiatry but not until the wound heals with a proceed with this. 4/9; the patient comes in today with her original wound on the medial ankle looking satisfactory however she has some uncontrolled swelling in the middle part of her leg with 2 new open areas superiorly just lateral to the tibia. I think this was probably a wrap issue. She said she felt uncomfortable during the week but did not call in. We have been using Hydrofera Blue 4/16; the wound on the medial ankle is about the same. She has innumerable small areas superior to this across her mid tibia. I think this is probably folliculitis. She is also been working in the yard doing a lot of sweating 4/30; the patient issue on the upper areas across her mid tibia of all healed. I think this was excessive yard work if I remember. Her wound on the medial ankle is smaller. Some debris on this we have been using Hydrofera Blue under Unna  boots 5/7; mid tibia. She has been using Hydrofera Blue under an Unna wrap. She is apparently going for her ankle surgery on June 3 10/28/19-Patient returns to clinic with the ankle wound, we  are using Hydrofera Blue under Unna wrap, surgery is scheduled for her left foot for June 3 so she will be back for nurse visit next week READMISSION 01/17/2020 [name] is a 65 year old woman we have had in this clinic for a long period of time with severe venous hypertension and refractory wounds on her medial lower legs and ankles bilaterally. 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 When she was off her legs for a prolonged period example when she fell and suffered a compression fracture things would heal up quite nicely. She is now retired and we managed to heal up the right medial leg wound. The left one was very tiny last time I saw this although still refractory. 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 It got to the point where this was interfering with her walking and she elected to proceed with a ankle arthrodesis to straighten her her ankle and leave her with a functional outcome for mobilization. The patient was referred to Dr. Doren Custard and really this took some time to arrange. Dr. Doren Custard saw her on 12/07/2019. Once again he verified that she had no arterial issues. She had previously had an angiogram several years ago. Follow-up ABIs on the left showed an ABI of 1.12 with triphasic waveforms and a TBI of 0.92. She is felt to have chronic deep venous insufficiency but I do not think it was felt that anything could be done from about this from an ablation point of view. At the time Dr. Doren Custard saw this patient the wounds actually look closed via the pictures in his clinic. The patient finally  underwent her surgery on 12/15/2019. This went reasonably well and there was a good anatomic outcome. She developed a small distal wound dehiscence on the lateral part of the surgical wound. However more problematically she is developed recurrence of the wound on the medial left ankle. There are actually 2 wounds here one in the distal lower leg and 1 pretty much at the level of the medial malleolus. It is a more distal area that is more problematic. She has been using Hydrofera Blue which started on Friday before this she was simply Ace wrapping. There was a culture done that showed Pseudomonas and she is on ciprofloxacin. A recent CNS on 8/11 was negative. The patient reports some pain but I generally think this is improving. She is using a cam boot completely nonweightbearing using a walker for pivot transfers and a wheelchair 8/24; not much improvement unfortunately she has a surgical wound on the lateral part in the venous insufficiency wound medially. The bottom part of the medial insufficiency wound is still necrotic there is exposed tendon here. We have been using Hydrofera Blue under compression. Her edema control is however better 8/31; patient in for follow-up of his surgical wound on the lateral part of her left leg and chronic venous insufficiency ulcers medially. We put her back in compression last week. She comes in today with a complaint of 3 or 4 days worth of increasing pain. She felt her cam walker was rubbing on the area on the back of her heel. However there is intense erythema seems more likely she has cellulitis. She had 2 cultures done when she was seeing podiatry in the postop. One of them  in late July showed Pseudomonas and she received a course of ciprofloxacin the other was negative on 8/11 she is allergic to penicillin with anaphylactoid complaints of hives oral swelling via information in epic 9/9; when I saw this patient last week she had intense anterior erythema around  her wound on the right lateral heel and ankle and also into the right medial heel. Some of this was no doubt drainage and her walker boot however I was convinced she had cellulitis. I gave her Levaquin and Bactrim she is finishing up on this now. She is following up with Dr. [name] he saw her yesterday. He is taken her out of the walking boot of course she is still nonweightbearing. Her x-ray was negative for any worrisome features such as soft tissue air etc. Things are a lot better this week. She has home health. We have been using Hydrofera Blue under an [employer] which she put back on yesterday. I did not wrap her last week 9/17; her surrounding skin looks a lot better. In fact the area on the left lateral ankle has just a scant amount of eschar. The only remaining wound is the large area on the left medial ankle. Probably about 60% of this is healthy granulation at the surface however she has a significant divot distally. This has adherent debris in it. I been using debridement and silver collagen to try and get this area to fill-in although I do not think we have made much progress this week 9/24; the patient's wound on the left medial ankle looks a lot better. The deeper divot area distally still requires debridement but this is cleaning up quite nicely we have been using silver collagen. The patient is complaining of swelling in her foot and is worried that that is contributing to the nonhealing of the ankle wound. She is also complaining of numbness in her anterior toes 10/4; left medial ankle. The small area distally still has a divot with necrotic material that I have been debriding away. This has an undermining area. She is approved for Apligraf. She saw Dr. [name] her surgeon on 10/1. I think he declared himself is satisfied with the condition of things. Still nonweightbearing till the end of the month. 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 Her surgical  wound is well closed. There is no evidence of infection 10/11; the patient arrived in clinic today with the expectation that we be able to put an Apligraf on this area after debridement however she arrives with a relatively large amount of green drainage on the dressing. The patient states that this started on Friday. She has not been systemically unwell. 10/19; culture I did last week showed both Enterococcus and Pseudomonas. I think this came in separate parts because I stopped her ciprofloxacin I gave her and prescribed her linezolid however now looking at the final culture result this is Pseudomonas which is resistant to quinolones. She has not yet picked up the linezolid apparently phone issues. We are also trying to get a topical antibiotic out of [place] in [place] they can be applied by home health. She is still having green drainage 10/16; the patient has her topical antibiotic from [facility] in [place]. This is a compounded gel with vancomycin and ciprofloxacin and gentamicin. We are applying this on the wound bed with silver alginate over the top with Unna boot wraps. She arrives in clinic today with a lot less ominous looking drainage although she  is only use this topical preparation once the second time today. She sees Dr. [name] her surgeon on Friday she has home health changing the dressing 11/2; still using her compounded topical antibiotic under silver alginate. Surface is cleaning up there is less drainage. We had an Apligraf for her today and I elected to apply it. A light coating of her antibiotic 04/25/2020 upon evaluation today patient appears to be doing well with regard to her ankle ulcer. There is a little bit of slough noted on the surface of the wound I am can have to perform sharp debridement to clear this away today. With that being said other than that fact overall I feel like she is making progress and we do see some new epithelial growth. There is also  some improvement in the depth of the wound and that distal portion. There is little bit of slough there as well. 12/7; 2-week follow-up. Apligraf #3. Dimensions are smaller. Closing in especially inferiorly. Still some surface debris. Still using the [facility] topical antibiotic but I told her that I don't think this needs to be renewed 12/21; 2-week follow-up. Apligraf #4 dimensions are smaller. Nice improvement 06/05/2020; 2-week follow-up. The patient's wound on the left medial ankle looks really excellent. Nice granulation. Advancing epithelialization no undermining no evidence of infection. We would have to reapply for another Apligraf but with the condition of this wound I did not feel strongly about it. We used Hydrofera Blue under the same degree of compression. She follows up with Dr. [name] her surgeon a week Friday 06/13/2020 upon evaluation today patient appears to be doing excellent in regard to her wound. She has been tolerating the dressing changes without complication. Fortunately there is no signs of active infection at this time. No fevers, chills, nausea, vomiting, or diarrhea. She was using Hydrofera Blue last week. 06/20/2020 06/20/2020 on evaluation today patient actually appears to be doing excellent in regard to her wound. This is measuring better and looking much better as well. She has been using the collagen that seems to be doing better for her as well even though the [facility] was and is not sticking or feeling as rough on her wound. She did see Dr. [name] on Friday he is very pleased he also stated none of the hardware has shifted. That is great news 1/27; the patient has a small clean wound all that is remaining. I agree that this is too small to really consider an Apligraf. Under illumination the surface is looking quite good. We have been using collagen although the dimensions are not any better this week 2/2; the patient has a small clean wound on the left medial ankle.  Although this left of her substantial original areas. Measurements are smaller. We have been using polymen Ag under an Haematologist. 2/10; small area on the left medial ankle. This looks clean nothing to debride however dimensions are about the same we have been using polymen I think now for 2 weeks 2/17; not much change in surface area. We have been using polymen Ag without any improvement. 3/17; 1 month follow-up. The patient has been using endoform without any improvement in fact I think this looks worse with more depth and more expansion 3/24; no improvement. Perhaps less debris on the surface. We have been using Sorbact for 1 week 4/4; wound measures larger. She has edema in her leg and her foot which she tells as her wrap came down. We have been using Unna boots. Sorbact of  the wound. She has been approved for Apligraf 09/12/2020 upon evaluation today patient appears to be doing well with regard to her wound. We did get the Apligraf reapproved which is great news we have that available for application today. Fortunately there is no signs of infection and overall the patient seems to be doing great. The wound bed is nice and clean. 4/27; patient presents for her second application of Apligraf. She states over the past week she has been on her feet more often due to being outside in her garden. She has noted more swelling to her foot as a result. She denies increased warmth, pain or erythema to the wound site. 10/10/2020 upon evaluation today patient appears to be doing well with regard to her wound which does not appear to be quite as irritated as last week from what I am hearing. With that being said unfortunately she is having issues with some erythema and warmth to touch as well as an increase size. I do believe this likely is infected. 10/17/2020 upon evaluation today patient appears to be doing excellent in regard to her wound this is significantly improved as compared to last week. Fortunately  I think that the infection is much better controlled at this point. She did have evidence of both Enterococcus as well as Staphylococcus noted on culture. Enterococcus really would not be helped significantly by the Cipro but the wound is doing so much better I am under the assumption that the Staphylococcus is probably the main organism that is causing the current infection. Nonetheless I think that she is doing excellent as far as that is concerned and I am very pleased in that regard. I would therefore recommend she continue with the Cipro. 10/31/2020 upon evaluation today patient appears to be doing well with regard to her wound. She has been tolerating the dressing changes without complication. Fortunately there is no signs of active infection and overall I am extremely pleased with where things stand today. No fevers, chills, nausea, vomiting, or diarrhea. With that being said she does have some green drainage coming from the wound and although it looks okay I am a little concerned about the possibility of a continuing infection. Specifically with Pseudomonas. For that reason I will go ahead and send in a prescription for Cipro for her to be continued. 11/14/2020 upon evaluation today patient appears to be doing very well currently in regard to her wound on her leg. She has been tolerating the dressing changes without complication. Fortunately I feel like the infection is finally under good control here. Unfortunately we do not have the Apligraf for application today although we can definitely order to have it in place for next week. That will be her fifth and final of the current series. Nonetheless I feel like her wound is really doing quite well which is great news. 11/21/2020 upon evaluation today patient appears to be doing well with regard to her wound on the medial ankle. Fortunately I think the infection is under control and I do believe we can go ahead and reapply the Apligraf today. She is  in agreement with that plan. There does not appear to be any signs of active infection at this time which is great news. No fevers, chills, nausea, vomiting, or diarrhea. 12/05/2020 upon evaluation today patient's wound bed actually showed signs of good granulation epithelization at this point. There does not appear to be any signs of infection which is great news and overall very pleased with where things  stand. Overall the patient seems to be doing fairly well in my opinion with regard to her wound although I do believe she continues to build up a lot of biofilm I think she could benefit from using PuraPly at this point. 12/12/2020 upon evaluation today patient's wound actually appears to be doing decently well today. The Unna boot has not been quite as well-tolerated so that more uncomfortable for her and even causing some pressure over the plate on the lateral portion of her foot which is 90 where the wound is. There did not appear to be any significant deep tissue injury with that there may be a minimal change in the skin noted I think that we may want to go back to the Coflex 2 layer which is a little bit easier on her skin it seems. 12/19/2020 upon evaluation today patient actually seems to be making great progress with the PuraPly currently. She in fact seems to be much better as far as the overall appearance of the wound bed is concerned I am very happy in this regard. I do not see any signs of of infection which is great news as well. No fevers, chills, nausea, vomiting, or diarrhea. 12/26/2020 upon evaluation today patient appears to actually be doing better in regard to her wound on the left medial ankle region. The surface of the wound is actually doing significantly better which is great news. There does not appear to be any signs of infection which is also great news and in general I am extremely pleased with where we stand today. 01/02/2021 upon evaluation today patient appears to be doing  well with regard to her wound. In fact this is showing signs of excellent improvement and very pleased with where things stand. In fact the last 3 appointments have all shown signs of this getting smaller which is excellent news. I have not even had to perform any debridement and today is no exception. Overall I feel like this is dramatically improved compared to previous. T oday is PuraPly application #4. Objective Constitutional Well-nourished and well-hydrated in no acute distress. Vitals Time Taken: 12:59 PM, Height: 68 in, Temperature: 98.3 F, Pulse: 62 bpm, Respiratory Rate: 16 breaths/min, Blood Pressure: 112/63 mmHg. Respiratory normal breathing without difficulty. Psychiatric this patient is able to make decisions and demonstrates good insight into disease process. Alert and Oriented x 3. pleasant and cooperative. General Notes: Upon inspection patient's wound bed actually showed signs of good granulation epithelization at this point. The overall surface of the wound appears to be doing all some. I am actually extremely pleased with where things stand today. No fevers, chills, nausea, vomiting, or diarrhea. Integumentary (Hair, Skin) Wound #15 status is Open. Original cause of wound was Gradually Appeared. The date acquired was: 12/30/2019. The wound has been in treatment 50 weeks. The wound is located on the Left,Medial Malleolus. The wound measures 3.3cm length x 2.4cm width x 0.1cm depth; 6.22cm^2 area and 0.622cm^3 volume. There is Fat Layer (Subcutaneous Tissue) exposed. There is no tunneling or undermining noted. There is a medium amount of serosanguineous drainage noted. The wound margin is distinct with the outline attached to the wound base. There is large (67-100%) red, pink granulation within the wound bed. There is a small (1-33%) amount of necrotic tissue within the wound bed including Adherent Slough. 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 A skin graft procedure using a bioengineered skin substitute/cellular or tissue based product was performed by [name], PA with the following instrument(s): Forceps and Scissors. Puraply AM was applied and was not secured. 12 sq cm of product was utilized and 0 sq cm was wasted. Post Application, adaptic was applied. A Time Out was conducted at 13:20, prior to the start of the procedure. The procedure was tolerated well with a pain level of 0 throughout and a pain level of 0 following the procedure. Post procedure Diagnosis Wound #15: Same as Pre-Procedure . Pre-procedure diagnosis of Wound #15 is a Venous Leg Ulcer located on the Left,Medial Malleolus . There was a Double Layer Compression Therapy Procedure by Deon Pilling, RN. Post procedure Diagnosis Wound #15: Same as Pre-Procedure Plan Follow-up Appointments: Return Appointment in 1 week. - with Margarita Grizzle - Puraply Cellular or Tissue Based Products: Cellular or Tissue Based Product Type: - PuraPly: #4 Bathing/ Shower/ Hygiene: May shower with protection but do not get wound dressing(s) wet. Edema Control - Lymphedema / SCD / Other: Elevate legs to the level of the heart or above for 30 minutes daily and/or when sitting, a frequency of: - throughout the day Avoid standing for long periods of time. Exercise regularly Compression stocking or Garment 20-30 mm/Hg pressure to: - right leg daily Additional Orders / Instructions: Follow Nutritious Diet WOUND #15: - Malleolus Wound Laterality: Left, Medial Cleanser: Soap and Water 1 x Per Week/30 Days Discharge Instructions: May shower and wash wound  with dial antibacterial soap and water prior to dressing change. Peri-Wound Care: Triamcinolone 15 (g) 1 x Per Week/30 Days Discharge Instructions: Use triamcinolone 15 (g) mixed with lotion Peri-Wound Care: Sween Lotion (Moisturizing lotion) 1 x Per Week/30 Days Discharge Instructions: Apply moisturizing lotion as directed Prim Dressing: PuraPly AM 1 x Per Week/30 Days ary Secondary Dressing: Woven Gauze Sponge, Non-Sterile 4x4 in 1 x Per Week/30 Days Discharge Instructions: Apply over primary dressing as directed. Secondary Dressing: ABD Pad, 5x9 1 x Per Week/30 Days Discharge Instructions: Apply over primary dressing and lateral ankle Secondary Dressing: Optifoam Non-Adhesive Dressing, 4x4 in 1 x Per Week/30 Days Discharge Instructions: Apply over primary dressing pad lateral foot with foam donut Secondary Dressing: ADAPTIC TOUCH 3x4.25 in 1 x Per Week/30 Days Discharge Instructions: Apply over skin sub Com pression Wrap: CoFlex TLC XL 2-layer Compression System 4x7 (in/yd) 1 x Per Week/30 Days Discharge Instructions: Apply CoFlex 2-layer compression as directed. (alt for 4 layer) 1. I am going to recommend that we going continue with the wound care measures as before and the patient is in agreement with plan. This includes the use of the PuraPly again I think that is doing also the worsening and definite signs of improvement since we began this treatment. Overall I think that this is headed in the correct direction. 2. I am also going to recommend at this time that we have the patient continue to monitor for any signs of worsening such as increased pain obviously with this wrap she cannot see exactly what is going on but if she has more discomfort that is out of the normal she should let me know. We will see patient back for reevaluation in 1 week here in the clinic. If anything worsens or changes patient will contact our office for additional recommendations. Electronic  Signature(s) Signed: 01/02/2021 1:31:48 PM By: [name] PA-C Entered By: [name] on 01/02/2021 13:31:47 -------------------------------------------------------------------------------- SuperBill Details Patient Name: Date of Service: [facility] [name], [name] 01/02/2021 Medical Record Number: [id number] Patient Account Number: [id number] Date of Birth/Sex: Treating RN: [id number] (65 y.o. [name] Provider: [name] Other Clinician: Referring Provider: Treating Provider/Extender: [name] in Treatment: 50 Diagnosis Coding ICD-10 Codes Code Description [phone] Chronic venous hypertension (idiopathic) with ulcer and inflammation of left lower extremity L97.828 Non-pressure chronic ulcer of other part of left lower leg with other specified severity L97.328 Non-pressure chronic ulcer of left ankle with other specified severity Facility Procedures CPT4 Code: [id number] Description: [id number] PuraPly AM 3X4 (12sq. cm) Modifier: Quantity: 12 CPT4 Code: [id number] Description: [id number] - SKIN SUB GRAFT TRNK/ARM/LEG ICD-10 Diagnosis Description L97.828 Non-pressure chronic ulcer of other part of left lower leg with other specified s Modifier: everity Quantity: 1 Physician Procedures : CPT4 Code Description Modifier [id number] - WC PHYS SKIN SUB GRAFT TRNK/ARM/LEG ICD-10 Diagnosis Description L97.828 Non-pressure chronic ulcer of other part of left lower leg with other specified severity Quantity: 1 Electronic Signature(s) Signed: 01/02/2021 1:32:18 PM By: [name] PA-C Entered By: [name] on 01/02/2021 13:32:17

## 2021-01-04 NOTE — Progress Notes (Addendum)
FELEICA, FULMORE ([id number]) Visit Report for 01/02/2021 Arrival Information Details Patient Name: Date of Service: [name], [name] 01/02/2021 12:45 PM Medical Record Number: [id number] Patient Account Number: [id number] Date of Birth/Sex: Treating RN: 09-17-1955 (65 y.o. [name] Primary Care Eryca Bolte: [name] Other Clinician: Referring Shahana Capes: Treating Carianna Lague/Extender: [name] in Treatment: 71 Visit Information History Since Last Visit Added or deleted any medications: No Patient Arrived: Cane Any new allergies or adverse reactions: No Arrival Time: 12:59 Had a fall or experienced change in No Accompanied By: alone activities of daily living that may affect Transfer Assistance: None risk of falls: Patient Identification Verified: Yes Signs or symptoms of abuse/neglect since last visito No Secondary Verification Process Completed: Yes Hospitalized since last visit: No Patient Requires Transmission-Based Precautions: No Implantable device outside of the clinic excluding No Patient Has Alerts: Yes cellular tissue based products placed in the [name] Patient Alerts: L ABI =1.12, TBI = .92 since last visit: R ABI= 1.02, TBI= .58 Has Dressing in Place as Prescribed: Yes Has Compression in Place as Prescribed: Yes Pain Present Now: Yes Electronic Signature(s) Signed: 01/04/2021 12:35:09 PM By: Levan Hurst RN, BSN Entered By: Levan Hurst on 01/02/2021 13:01:25 -------------------------------------------------------------------------------- Compression Therapy Details Patient Name: Date of Service: [name], [name] Kingfisher G. 01/02/2021 12:45 PM Medical Record Number: [id number] Patient Account Number: [id number] Date of Birth/Sex: Treating RN: September 14, 1955 (65 y.o. [name] Primary Care Demetre Monaco: [name] Other Clinician: Referring Jaqualyn Juday: Treating Katarina Riebe/Extender: [name] in Treatment:  50 Compression Therapy Performed for Wound Assessment: Wound #15 Left,Medial Malleolus Performed By: Clinician Deon Pilling, RN Compression Type: Double Layer Post Procedure Diagnosis Same as Pre-procedure Electronic Signature(s) Signed: 01/02/2021 5:57:23 PM By: Baruch Gouty RN, BSN Entered By: Baruch Gouty on 01/02/2021 13:23:40 -------------------------------------------------------------------------------- Encounter Discharge Information Details Patient Name: Date of Service: [name], [name]. 01/02/2021 12:45 PM Medical Record Number: [id number] Patient Account Number: [id number] Date of Birth/Sex: Treating RN: [birth year] (65 y.o. [name] Primary Care [name]: [name] Other Clinician: Referring [name]: Treating [name]/Extender: [name] in Treatment: 37 Encounter Discharge Information Items Post Procedure Vitals Discharge Condition: Stable Temperature (F): 98.3 Ambulatory Status: Cane Pulse (bpm): 62 Discharge Destination: Home Respiratory Rate (breaths/min): 16 Transportation: Private Auto Blood Pressure (mmHg): 112/63 Accompanied By: alone Schedule Follow-up Appointment: Yes Clinical Summary of Care: Patient Declined Electronic Signature(s) Signed: 01/04/2021 12:35:09 PM By: Levan Hurst RN, BSN Entered By: Levan Hurst on 01/02/2021 13:50:17 -------------------------------------------------------------------------------- Lower Extremity Assessment Details Patient Name: Date of Service: [name], [name] Kingfisher G. 01/02/2021 12:45 PM Medical Record Number: [id number] Patient Account Number: [id number] Date of Birth/Sex: Treating RN: 12-Feb-1956 (65 y.o. [name] Primary Care Lawrence Roldan: [name] Other Clinician: Referring Ascencion Stegner: Treating [name]/Extender: [name] Weeks in Treatment: 50 Edema Assessment Assessed: [Left: No] [Right: No] Edema: [Left: Ye] [Right:  s] Calf Left: Right: Point of Measurement: 31 cm From Medial Instep 27.5 cm Ankle Left: Right: Point of Measurement: 11 cm From Medial Instep 21.5 cm Vascular Assessment Pulses: Dorsalis Pedis Palpable: [Left:Yes] Electronic Signature(s) Signed: 01/04/2021 12:35:09 PM By: Levan Hurst RN, BSN Entered By: Levan Hurst on 01/02/2021 13:10:25 -------------------------------------------------------------------------------- [place] Details Patient Name: Date of Service: [name] [name], [name] Kingfisher G. 01/02/2021 12:45 PM Medical Record Number: [id number] Patient Account Number: [id number] Date of Birth/Sex: Treating RN: [birth year] (65 y.o. [name] Primary Care [name]:  [name] Other Clinician: Referring Marites Nath: Treating [name]/Extender: [name] in Treatment: 56 Multidisciplinary Care Plan reviewed with physician Active Inactive Venous Leg Ulcer Nursing Diagnoses: Actual venous Insuffiency (use after diagnosis is confirmed) Knowledge deficit related to disease process and management Goals: Patient will maintain optimal edema control Date Initiated: 06/20/2020 Target Resolution Date: 01/30/2021 Goal Status: Active Interventions: Assess peripheral edema status every visit. Compression as ordered Treatment Activities: Therapeutic compression applied : 06/20/2020 Notes: Wound/Skin Impairment Nursing Diagnoses: Impaired tissue integrity Knowledge deficit related to ulceration/compromised skin integrity Goals: Patient/caregiver will verbalize understanding of skin care regimen Date Initiated: 01/17/2020 Target Resolution Date: 01/30/2021 Goal Status: Active Ulcer/skin breakdown will have a volume reduction of 30% by week 4 Date Initiated: 01/17/2020 Date Inactivated: 03/12/2020 Target Resolution Date: 03/09/2020 Goal Status: Met Interventions: Assess patient/caregiver ability to obtain necessary supplies Assess  patient/caregiver ability to perform ulcer/skin care regimen upon admission and as needed Assess ulceration(s) every visit Provide education on ulcer and skin care Notes: Electronic Signature(s) Signed: 01/02/2021 5:57:23 PM By: Baruch Gouty RN, BSN Entered By: Baruch Gouty on 01/02/2021 13:18:47 -------------------------------------------------------------------------------- Pain Assessment Details Patient Name: Date of Service: [name], [name] Kingfisher G. 01/02/2021 12:45 PM Medical Record Number: [id number] Patient Account Number: [id number] Date of Birth/Sex: Treating RN: 04/18/1956 (65 y.o. [name] Primary Care Marven Veley: [name] Other Clinician: Referring Octa Uplinger: Treating Babacar Haycraft/Extender: [name] in Treatment: 50 Active Problems Location of Pain Severity and Description of Pain Patient Has Paino Yes Site Locations Pain Location: Pain in Ulcers With Dressing Change: Yes Duration of the Pain. Constant / Intermittento Intermittent Rate the pain. Current Pain Level: 3 Character of Pain Describe the Pain: Throbbing Pain Management and Medication Current Pain Management: Medication: Yes Cold Application: No Rest: No Massage: No Activity: No T.E.N.S.: No Heat Application: No Leg drop or elevation: No Is the Current Pain Management Adequate: Adequate How does your wound impact your activities of daily livingo Sleep: No Bathing: No Appetite: No Relationship With Others: No Bladder Continence: No Emotions: No Bowel Continence: No Work: No Toileting: No Drive: No Dressing: No Hobbies: No Electronic Signature(s) Signed: 01/04/2021 12:35:09 PM By: Levan Hurst RN, BSN Entered By: Levan Hurst on 01/02/2021 13:02:02 -------------------------------------------------------------------------------- Patient/Caregiver Education Details Patient Name: Date of Service: [name], [name] 8/3/2022andnbsp12:45 PM Medical  Record Number: [id number] Patient Account Number: [id number] Date of Birth/Gender: Treating RN: 1955/11/07 (65 y.o. [name] Primary Care Physician: [name] Other Clinician: Referring Physician: Treating Physician/Extender: [name] in Treatment: 83 Education Assessment Education Provided To: Patient Education Topics Provided Venous: Methods: Explain/Verbal Responses: Reinforcements needed, State content correctly Wound/Skin Impairment: Methods: Explain/Verbal Responses: Reinforcements needed, State content correctly Electronic Signature(s) Signed: 01/02/2021 5:57:23 PM By: Baruch Gouty RN, BSN Entered By: Baruch Gouty on 01/02/2021 13:19:12 -------------------------------------------------------------------------------- Wound Assessment Details Patient Name: Date of Service: [facility] [name], [name] Kingfisher G. 01/02/2021 12:45 PM Medical Record Number: [id number] Patient Account Number: [id number] Date of Birth/Sex: Treating RN: 06/22/1955 (65 y.o. [name] Primary Care Skyeler Smola: [name] Other Clinician: Referring Kahlil Cowans: Treating Eann Cleland/Extender: [name] Weeks in Treatment: 50 Wound Status Wound Number: 15 Primary Venous Leg Ulcer Etiology: Wound Location: Left, Medial Malleolus Wound Status: Open Wounding Event: Gradually Appeared Comorbid Peripheral Venous Disease, Osteoarthritis, Confinement Date Acquired: 12/30/2019 History: Anxiety Weeks Of Treatment: 50 Clustered Wound: No Photos Wound Measurements Length: (cm) 3.3 Width: (cm) 2.4 Depth: (cm) 0.1 Area: (cm)  6.22 Volume: (cm) 0.622 % Reduction in Area: 46.7% % Reduction in Volume: 93.3% Epithelialization: Small (1-33%) Tunneling: No Undermining: No Wound Description Classification: Full Thickness With Exposed Support Structures Wound Margin: Distinct, outline attached Exudate Amount: Medium Exudate Type:  Serosanguineous Exudate Color: red, brown Foul Odor After Cleansing: No Slough/Fibrino Yes Wound Bed Granulation Amount: Large (67-100%) Exposed Structure Granulation Quality: Red, Pink Fascia Exposed: No Necrotic Amount: Small (1-33%) Fat Layer (Subcutaneous Tissue) Exposed: Yes Necrotic Quality: Adherent Slough Tendon Exposed: No Muscle Exposed: No Joint Exposed: No Bone Exposed: No Electronic Signature(s) Signed: 01/14/2021 2:19:12 PM By: Donavan Burnet EMT Signed: 02/28/2021 4:50:18 PM By: Levan Hurst RN, BSN Previous Signature: 01/04/2021 12:35:09 PM Version By: Levan Hurst RN, BSN Entered By: Donavan Burnet on 01/14/2021 14:19:11 -------------------------------------------------------------------------------- [place] Details Patient Name: Date of Service: [name] [name], [name] Kingfisher G. 01/02/2021 12:45 PM Medical Record Number: [id number] Patient Account Number: [id number] Date of Birth/Sex: Treating RN: 1956/01/02 (65 y.o. [name] Primary Care Meline Russaw: [name] Other Clinician: Referring Jasdeep Kepner: Treating Otilio Groleau/Extender: [name] in Treatment: 50 Vital Signs Time Taken: 12:59 Temperature (F): 98.3 Height (in): 68 Pulse (bpm): 62 Respiratory Rate (breaths/min): 16 Blood Pressure (mmHg): 112/63 Reference Range: 80 - 120 mg / dl Electronic Signature(s) Signed: 01/04/2021 12:35:09 PM By: Levan Hurst RN, BSN Entered By: Levan Hurst on 01/02/2021 13:01:46

## 2021-01-09 ENCOUNTER — Other Ambulatory Visit: Payer: Self-pay

## 2021-01-09 ENCOUNTER — Encounter (HOSPITAL_BASED_OUTPATIENT_CLINIC_OR_DEPARTMENT_OTHER): Payer: Medicare Other | Admitting: Physician Assistant

## 2021-01-09 DIAGNOSIS — I87332 Chronic venous hypertension (idiopathic) with ulcer and inflammation of left lower extremity: Secondary | ICD-10-CM | POA: Diagnosis not present

## 2021-01-09 NOTE — Progress Notes (Signed)
EMERSYN, WYSS ([id number]) Visit Report for 01/09/2021 Arrival Information Details Patient Name: Date of Service: [place] [name], [name] 01/09/2021 10:45 A M Medical Record Number: [id number] Patient Account Number: [id number] Date of Birth/Sex: Treating RN: September 16, 1955 (65 y.o. [name] Primary Care Janyth Riera: [name] Other Clinician: Referring Millianna Szymborski: Treating Desiray Orchard/Extender: [name] in Treatment: 72 Visit Information History Since Last Visit Added or deleted any medications: No Patient Arrived: Cane Any new allergies or adverse reactions: No Arrival Time: 10:56 Had a fall or experienced change in No Transfer Assistance: None activities of daily living that may affect Patient Identification Verified: Yes risk of falls: Secondary Verification Process Completed: Yes Signs or symptoms of abuse/neglect since last visito No Patient Requires Transmission-Based Precautions: No Hospitalized since last visit: No Patient Has Alerts: Yes Implantable device outside of the clinic excluding No Patient Alerts: L ABI =1.12, TBI = .92 cellular tissue based products placed in the center R ABI= 1.02, TBI= .58 since last visit: Has Dressing in Place as Prescribed: Yes Has Compression in Place as Prescribed: Yes Pain Present Now: No Electronic Signature(s) Signed: 01/09/2021 3:42:23 PM By: [name] Entered By: [name] on 01/09/2021 10:56:38 -------------------------------------------------------------------------------- Compression Therapy Details Patient Name: Date of Service: [name], [name] 01/09/2021 10:45 A M Medical Record Number: [id number] Patient Account Number: [id number] Date of Birth/Sex: Treating RN: 11-02-1955 (65 y.o. [name] Chevella Pearce: [name] Other Clinician: Referring Louetta Hollingshead: Treating Lahoma Constantin/Extender: [name] in Treatment: 9 Compression Therapy  Performed for Wound Assessment: Wound #15 Left,Medial Malleolus Performed By: Clinician Deon Pilling, RN Compression Type: Double Layer Post Procedure Diagnosis Same as Pre-procedure Electronic Signature(s) Signed: 01/09/2021 3:55:35 PM By: Baruch Gouty RN, BSN Entered By: Baruch Gouty on 01/09/2021 11:48:04 -------------------------------------------------------------------------------- Lower Extremity Assessment Details Patient Name: Date of Service: [facility] [name], [name] 01/09/2021 10:45 A M Medical Record Number: [id number] Patient Account Number: [id number] Date of Birth/Sex: Treating RN: [birth year] (65 y.o. [name] Primary Care Terrilee Dudzik: [name] Other Clinician: Referring Joseguadalupe Stan: Treating Zebulun Deman/Extender: [name] Weeks in Treatment: 51 Edema Assessment Assessed: [Left: Yes] [Right: No] Edema: [Left: Ye] [Right: s] Calf Left: Right: Point of Measurement: 31 cm From Medial Instep 27 cm Ankle Left: Right: Point of Measurement: 11 cm From Medial Instep 20.8 cm Vascular Assessment Pulses: Dorsalis Pedis Palpable: [Left:Yes] Electronic Signature(s) Signed: 01/09/2021 3:42:23 PM By: [name] Entered By: [name] on 01/09/2021 11:00:18 -------------------------------------------------------------------------------- Multi-Disciplinary Care Plan Details Patient Name: Date of Service: [name], [name] 01/09/2021 10:45 A M Medical Record Number: [id number] Patient Account Number: [id number] Date of Birth/Sex: Treating RN: 09/12/1955 (65 y.o. [name] Sahian Kerney: [name] Other Clinician: Referring Emilly Lavey: Treating Evett Kassa/Extender: [name] in Treatment: 7 Multidisciplinary Care Plan reviewed with physician Active Inactive Venous Leg Ulcer Nursing Diagnoses: Actual venous Insuffiency (use after diagnosis is confirmed) Knowledge deficit related to  disease process and management Goals: Patient will maintain optimal edema control Date Initiated: 06/20/2020 Target Resolution Date: 01/30/2021 Goal Status: Active Interventions: Assess peripheral edema status every visit. Compression as ordered Treatment Activities: Therapeutic compression applied : 06/20/2020 Notes: Wound/Skin Impairment Nursing Diagnoses: Impaired tissue integrity Knowledge deficit related to ulceration/compromised skin integrity Goals: Patient/caregiver will verbalize understanding of skin care regimen Date Initiated: 01/17/2020 Target Resolution Date: 01/30/2021 Goal Status: Active Ulcer/skin breakdown will have a volume reduction of 30% by week  4 Date Initiated: 01/17/2020 Date Inactivated: 03/12/2020 Target Resolution Date: 03/09/2020 Goal Status: Met Interventions: Assess patient/caregiver ability to obtain necessary supplies Assess patient/caregiver ability to perform ulcer/skin care regimen upon admission and as needed Assess ulceration(s) every visit Provide education on ulcer and skin care Notes: Electronic Signature(s) Signed: 01/09/2021 3:55:35 PM By: Baruch Gouty RN, BSN Entered By: Baruch Gouty on 01/09/2021 11:51:14 -------------------------------------------------------------------------------- Pain Assessment Details Patient Name: Date of Service: [name], [name] 01/09/2021 10:45 A M Medical Record Number: [id number] Patient Account Number: [id number] Date of Birth/Sex: Treating RN: 1955-09-17 (65 y.o. [name] Primary Care Bladen Umar: [name] Other Clinician: Referring Mei Suits: Treating Madysyn Hanken/Extender: [name] in Treatment: 32 Active Problems Location of Pain Severity and Description of Pain Patient Has Paino No Site Locations Pain Management and Medication Current Pain Management: Electronic Signature(s) Signed: 01/09/2021 3:42:23 PM By: [name] Entered By: [name] on 01/09/2021 10:58:05 -------------------------------------------------------------------------------- Patient/Caregiver Education Details Patient Name: Date of Service: [name], [name] 8/10/2022andnbsp10:45 A M Medical Record Number: [id number] Patient Account Number: [id number] Date of Birth/Gender: Treating RN: 1955-12-18 (65 y.o. [name] Physician: [name] Other Clinician: Referring Physician: Treating Physician/Extender: [name] in Treatment: 48 Education Assessment Education Provided To: Patient Education Topics Provided Wound/Skin Impairment: Methods: Explain/Verbal Responses: Reinforcements needed, State content correctly [employer]) Signed: 01/09/2021 3:55:35 PM By: Baruch Gouty RN, BSN Entered By: Baruch Gouty on 01/09/2021 11:51:37 -------------------------------------------------------------------------------- Wound Assessment Details Patient Name: Date of Service: [name], [name] 01/09/2021 10:45 A M Medical Record Number: [id number] Patient Account Number: [id number] Date of Birth/Sex: Treating RN: 09/04/1955 (64 y.o. [name] Primary Care Latorya Bautch: [name] Other Clinician: Referring Jakerria Kingbird: Treating Jeraline Marcinek/Extender: [name] in Treatment: 51 Wound Status Wound Number: 15 Primary Venous Leg Ulcer Etiology: Wound Location: Left, Medial Malleolus Wound Status: Open Wounding Event: Gradually Appeared Comorbid Peripheral Venous Disease, Osteoarthritis, Confinement Date Acquired: 12/30/2019 History: Anxiety Weeks Of Treatment: 51 Clustered Wound: No Photos Wound Measurements Length: (cm) 3.1 Width: (cm) 2.2 Depth: (cm) 0.1 Area: (cm) 5.356 Volume: (cm) 0.536 % Reduction in Area: 54.1% % Reduction in Volume: 94.3% Epithelialization: Small (1-33%) Tunneling: No Undermining: No Wound Description Classification:  Full Thickness With Exposed Support Structures Wound Margin: Distinct, outline attached Exudate Amount: Medium Exudate Type: Serosanguineous Exudate Color: red, brown Foul Odor After Cleansing: No Slough/Fibrino Yes Wound Bed Granulation Amount: Large (67-100%) Exposed Structure Granulation Quality: Red, Pink Fascia Exposed: No Necrotic Amount: Small (1-33%) Fat Layer (Subcutaneous Tissue) Exposed: Yes Necrotic Quality: Adherent Slough Tendon Exposed: No Muscle Exposed: No Joint Exposed: No Bone Exposed: No Electronic Signature(s) Signed: 01/09/2021 3:42:23 PM By: [name] Entered By: [name] on 01/09/2021 11:03:20 -------------------------------------------------------------------------------- Vitals Details Patient Name: Date of Service: [name], [name] 01/09/2021 10:45 A M Medical Record Number: [id number] Patient Account Number: [id number] Date of Birth/Sex: Treating RN: [birth year] (65 y.o. [name] Primary Care Velta Rockholt: [name] Other Clinician: Referring Thelda Gagan: Treating Janney Priego/Extender: [name] in Treatment: 29 Vital Signs Time Taken: 10:56 Temperature (F): 98.2 Height (in): 68 Pulse (bpm): 60 Respiratory Rate (breaths/min): 18 Blood Pressure (mmHg): 130/72 Reference Range: 80 - 120 mg / dl Electronic Signature(s) Signed: 01/09/2021 3:42:23 PM By: [name] Entered By: [name] on 01/09/2021 10:57:15

## 2021-01-09 NOTE — Progress Notes (Addendum)
MARESA, [name] ([id number]) Visit Report for 01/09/2021 Chief Complaint Document Details Patient Name: Date of Service: [name] [name], [name] 01/09/2021 10:45 A M Medical Record Number: [id number] Patient Account Number: [id number] Date of Birth/Sex: Treating RN: 1955/09/15 (65 y.o. [name] Primary Care Provider: [name] Other Clinician: Referring Provider: Treating Provider/Extender: [name] in Treatment: 96 Information Obtained from: Patient Chief Complaint patient is been followed long-term in this clinic for venous insufficiency ulcers with inflammation, hypertension and ulceration over the medial ankle bilaterally. 01/17/2020; this is a patient who is here for review of postoperative wounds on the left lateral ankle and recurrence of venous stasis ulceration on the left medial Electronic Signature(s) Signed: 01/09/2021 11:15:05 AM By: [name] PA-C Entered By: [name] on 01/09/2021 11:15:05 -------------------------------------------------------------------------------- Cellular or Tissue Based Product Details Patient Name: Date of Service: [facility] [name], [name] 01/09/2021 10:45 A M Medical Record Number: [id number] Patient Account Number: [id number] Date of Birth/Sex: Treating RN: 14-Jan-1956 (65 y.o. [name] Primary Care Provider: [name] Other Clinician: Referring Provider: Treating Provider/Extender: [name] in Treatment: 23 Cellular or Tissue Based Product Type Wound #15 Left,Medial Malleolus Applied to: Performed By: Physician [name], PA Cellular or Tissue Based Product Type: Puraply AM Level of Consciousness (Pre-procedure): Awake and Alert Pre-procedure Verification/Time Out Yes - 11:45 Taken: Location: trunk / arms / legs Wound Size (sq cm): 6.82 Product Size (sq cm): 12 Waste Size (sq cm): 0 Amount of Product Applied (sq cm): 12 Instrument Used:  Scissors Lot #: [id number] Order #: [id number] Expiration Date: 04/23/2023 Fenestrated: No Reconstituted: Yes Solution Type: saline Solution Amount: 2 ml Lot #: [id number] Solution Expiration Date: 01/31/2022 Secured: No Dressing Applied: Yes Primary Dressing: adaptic Procedural Pain: 0 Post Procedural Pain: 0 Response to Treatment: Procedure was tolerated well Level of Consciousness (Post- Awake and Alert procedure): Post Procedure Diagnosis Same as Pre-procedure Electronic Signature(s) Signed: 01/09/2021 3:55:35 PM By: Baruch Gouty RN, BSN Signed: 01/10/2021 5:47:57 PM By: [name] PA-C Entered By: Baruch Gouty on 01/09/2021 11:50:55 -------------------------------------------------------------------------------- HPI Details Patient Name: Date of Service: [name], [name] 01/09/2021 10:45 A M Medical Record Number: [id number] Patient Account Number: [id number] Date of Birth/Sex: Treating RN: 06-26-1955 (65 y.o. [name] Primary Care Provider: [name] Other Clinician: Referring Provider: Treating Provider/Extender: [name] in Treatment: 11 History of Present Illness HPI Description: the remaining wound is over the left medial ankle. Similar wound over the right medial ankle healed largely with use of Apligraf. Most recently we have been using Hydrofera Blue over this wound with considerable improvement. The patient has been extensively worked up in the past for her venous insufficiency and she is not a candidate for antireflux surgery although I have none of the details available currently. 08/24/14; considerable improvement today. About 50% of this wound areas now epithelialized. The base of the wound appears to be healthier granulation.as opposed to last week when she had deteriorated a considerable improvement 08/17/14; unfortunately the wound has regressed somewhat. The areas of epithelialization from the superior aspect are  not nearly as healthy as they were last week. The patient thinks her Hydrofera Blue slipped. 09/07/14; unfortunately the area has markedly regressed in the 2 weeks since I've seen this. There is an odor surrounding erythema. The healthy granulation tissue that we had at the base of the wound now is a dusky  color. The nurse reports green drainage 09/14/14; the area looks somewhat better than last week. There is less erythema and less drainage. The culture I did did not show any growth. Nevertheless I think it is better to continue the Cipro and doxycycline for a further week. The remaining wound area was debridement. 09/21/14. Wound did not require debridement last week. Still less erythema and less drainage. She can complete her antibiotics. The areas of epithelialization in the superior aspect of the wound do not look as healthy as they did some weeks ago 10/05/14 continued improvement in the condition of this wound. There is advancing epithelialization. Less aggressive debridement required 10/19/14 continued improvement in the condition and volume of this wound. Less aggressive debridement to the inferior part of this to remove surface slough and fibrinous eschar 11/02/14 no debridement is required. The surface granulation appears healthy although some of her islands of epithelialization seem to have regressed. No evidence of infection 11/16/14; lites surface debridement done of surface eschar. The wound does not look to be unhealthy. No evidence of infection. Unfortunately the patient has had podiatry issues in the right foot and for some reason has redeveloped small surface ulcerations in the medial right ankle. Her original presentation involved wounds in this area 11/23/14 no debridement. The area on the right ankle has enlarged. The left ankle wound appears stable in terms of the surface although there is periwound inflammation. There has been regression in the amount of new skin 11/30/14 no  debridement. Both wound areas appear healthy. There was no evidence of infection. The the new area on the right medial ankle has enlarged although that both the surfaces appear to be stable. 12/07/14; Debridement of the right medial ankle wound. No no debridement was done on the left. 12/14/14 no major change in and now bilateral medial ankle wounds. Both of these are very painful but the no overt evidence of infection. She has had previous venous ablation 12/21/14; patient states that her right medial ankle wound is considerably more painful last week than usual. Her left is also somewhat painful. She could not tolerate debridement. The right medial ankle wound has fibrinous surface eschar 12/28/14 this is a patient with severe bilateral venous insufficiency ulcers. For a considerable period of time we actually had the one on the right medial ankle healed however this recently opened up again in June. The left medial ankle wound has been a refractory area with some absent flows. We had some success with Hydrofera Blue on this area and it literally closed by 50% however it is recently opened up Foley. Both of these were debridement today of surface eschar. She tolerates this poorly 01/25/15: No change in the status of this. Thick adherent escar. Very poor tolerance of any attempt at debridement. I had healed the right medial malleolus wound for a considerable amount of time and had the left one down to about 50% of the volume although this is totally regressed over the last 48 weeks. Further the right leg has reopened. she is trying to make a appointment with pain and vascular, previous ablations with Dr. Aleda Grana. I do not believe there is an arterial insufficiency issue here 02/01/15 the status of the adherent eschar bilaterally is actually improved. No debridement was done. She did not manage to get vascular studies done 02/08/15 continued debridement of the area was done today. The slough is less  adherent and comes off with less pressure. There is no surrounding infection peripheral pulses are intact 02/15/15  selective debridement with a disposable curette. Again the slough is less adherent and comes off with less difficulty. No surrounding infection peripheral pulses are intact. 02/22/15 selective debridement of the right medial ankle wound. Slough comes off with less difficulty. No obvious surrounding infection peripheral pulses are intact I did not debridement the one on the left. Both of these are stable to improved 03/01/15 selective debridement of both wound areas using a curette to. Adherent slough cup soft with less difficulty. No obvious surrounding infection. The patient tells me that 2 days ago she noted a rash above the right leg wrap. She did not have this on her lower legs when she change this over she arrives with widespread left greater than right almost folliculitis-looking rash which is extremely pruritic. I don't see anything to culture here. There is no rash on the rest of her body. She feels well systemically. 03/08/15; selective debridement of both wounds using a curette. Base of this does not look unhealthy. She had limegreen drainage coming out of the left leg wound and describes a lot of drainage. The rash on her left leg looks improved to. No cultures were done. 03/22/15; patient was not here last week. Basal wounds does not look healthy and there is no surrounding erythema. No drainage. There is still a rash on the left leg that almost looks vasculitic however it is clearly limited to the top of where the wrap would be. 04/05/15; on the right required a surgical debridement of surface eschar and necrotic subcutaneous tissue. I did not debridement the area on the left. These continue to be large open wounds that are not changing that much. We were successful at one point in healing the area on the right, and at the same time the area on the left was roughly half the size  of current measurements. I think a lot of the deterioration has to do with the prolonged time the patient is on her feet at work 04/19/15 I attempted-like surface debridement bilaterally she does not tolerate this. She tells me that she was in allergic care yesterday with extreme pain over her left lateral malleolus/ankle and was told that she has an "sprain" 05/03/15; large bilateral venous insufficiency wounds over the medial malleolus/medial aspect of her ankles. She complains of copious amounts of drainage and his usual large amounts of pain. There is some increasing erythema around the wound on the right extending into the medial aspect of her foot to. historically she came in with these wounds the right one healed and the left one came down to roughly half its current size however the right one is reopened and the left is expanded. 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 05/10/15 large bilateral venous insufficiency wounds. There is less adherence surface left however the surface culture that I did last week grew pseudomonas therefore bilateral selective debridement score necessary. There is surrounding erythema. The patient describes severe bilateral drainage and a lot of pain in the left ankle. Apparently her podiatrist was were ready to do a cortisone shot 05/17/15; the patient complains of pain and again copious amounts of drainage. 05/24/15; we used Iodo flex last week. Patient notes considerable improvement in wound drainage. Only needed to change this once. 05/31/15; we continued Iodoflex; the base of these large wounds bilaterally is not too bad but there is probably likely a significant bioburden here. I would like to debridement just doesn't  tolerate it. 06/06/14 I would like to continue the Iodoflex although she still hasn't managed to obtain supplies. She has bilateral medial malleoli or large wounds which are mostly superficial.  Both of them are covered circumferentially with some nonviable fibrinous slough although she tolerates debridement very poorly. She apparently has an appointment for an ablation on the right leg by interventional radiology. 06/14/15; the patient arrives with the wounds and static condition. We attempted a debridement although she does not do well with this secondary to pain. I 07/05/15; wounds are not much smaller however there appears to be a cleaner granulating base. The left has tight fibrinous slough greater than the right. Debridement is tolerated poorly due to pain. Iodoflex is done more for these wounds in any of the multitude of different dressings I have tried on the left 1 and then subsequently the right. 07/12/15; no change in the condition of this wound. I am able to do an aggressive debridement on the right but not the left. She simply cannot tolerate it. We have been using Iodoflex which helps somewhat. It is worthwhile remembering that at one point we healed the right medial ankle wound and the left was about 25% of the current circumference. We have suggested returning to vascular surgery for review of possible further ablations for one reason or another she has not been able to do this. 07/26/15 no major change in the condition of either wound on her medial ankle. I did not attempt to debridement of these. She has been aggressively scrubbing these while she is in the shower at home. She has her supply of Iodoflex which seems to have done more for these wounds then anything I have put on recently. 08/09/15 wound area appears larger although not verified by measurements. Using Iodoflex 09/05/2015 -- she was here for avisit today but had significant problems with the wound and I was asked to see her for a physician opinion. I have summarize that this lady has had surgery on her left lower extremity about 10 years ago where the possible veins stripping was done. She has had an opinion from  interventional radiology around November 2016 where no further sclerotherapy was ordered. The patient works 12 hours a day and stands on a concrete floor with work boots and is unable to get the proper compression she requires and cannot elevate her limbs appropriately at any given time. She has recently grown Pseudomonas from her wound culture but has not started her ciprofloxacin which was called in for her. 09/13/15 this continues to be a difficult situation for this patient. At one point I had this wound down to a 1.5 x 1.5" wound on her left leg. This is deteriorated and the right leg has reopened. She now has substantial wounds on her medial calcaneus, malleoli and into her lower leg. One on the left has surface eschar but these are far too painful for me to debridement here. She has a vascular surgery appointment next week to see if anything can be done to help here. I think she has had previous ablations several years ago at [facility]. She has no major edema. She tells me that she did not get product last time [facility] Ag] and went for several days without it. She continues to work in work boots 12 hours a day. She cannot get compression/4-layer under her work boots. 09/20/15 no major change. Periwound edema control was not very good. Her point with pain and vascular is next Wednesday the 25th  09/28/15; the patient is seen vascular surgery and is apparently scheduled for repeat duplex ultrasounds of her bilateral lower legs next week. 10/05/15; the patient was seen by Dr. Doren Custard of vascular surgery. He feels that she should have arterial insufficiency excluded as cause/contributed to her nonhealing stage she is therefore booked for an arteriogram. She has apparently monophasic signals in the dorsalis pedis pulses. She also of course has known severe chronic venous insufficiency with previous procedures as noted previously. I had another long discussion with the patient today about her continuing  to work 12 hour shifts. I've written her out for 2 months area had concerns about this as her work location is currently undergoing significant turmoil and this may lead to her termination. She is aware of this however I agree with her that she simply cannot continue to stand for 12 hours multiple days a week with the substantial wound areas she has. 10/19/15; the Dr. Doren Custard appointment was largely for an arteriogram which was normal. She does not have an arterial issue. He didn't make a comment about her chronic venous insufficiency for which she has had previous ablations. Presumably it was not felt that anything additional could be done. The patient is now out of work as I prescribed 2 weeks ago. Her wounds look somewhat less aggravated presumably because of this. I felt I would give debridement another try today 10/25/15; no major change in this patient's wounds. We are struggling to get her product that she can afford into her own home through her insurance. 11/01/15; no major change in the patient's wounds. I have been using silver alginate as the most affordable product. I spoke to Dr. Marla Roe last week with her requested take her to the OR for surgical debridement and placement of ACEL. Dr. Marla Roe told me that she would be willing to do this however [facility] will not cover this, fortunately the patient has [place] healthcare of some variant 11/08/15; no major change in the patient's wounds. She has been completely nonviable surface that this but is in too much pain with any attempted debridement are clinic. I have arranged for her to see Dr. Marla Roe ham of plastic surgery and this appointment is on Monday. I am hopeful that they will take her to the OR for debridement, possible ACEL ultimately possible skin graft 11/22/15 no major change in the patient's wounds over her bilateral medial calcaneus medial malleolus into the lower legs. Surface on these does not look too bad  however on the left there is surrounding erythema and tenderness. This may be cellulitis or could him sleepy tinea. 11/29/15; no major changes in the patient's wounds over her bilateral medial malleolus. There is no infection here and I don't think any additional antibiotics are necessary. There is now plan to move forward. She sees Dr. Marla Roe in a week's time for preparation for operative debridement and ACEL placement I believe on 7/12. She then has a follow-up appointment with Dr. Marla Roe on 7/21 12/28/15; the patient returns today having been taken to the [place] by Dr. Marla Roe 12/12/15 she underwent debridement, intraoperative cultures [which were negative]. She had placement of a wound VAC. Parent really ACEL was not available to be placed. The wound VAC foam apparently adhered to the wound since then she's been using silver alginate, Xeroform under Ace wraps. She still says there is a lot of drainage and a lot of pain 01/31/16; this is a patient I see monthly. I had referred her to Dr.  Dillingham him of plastic surgery for large wounds on her bilateral medial ankles. She has been to the OR twice once in early July and once in early August. She tells me over the last 3 weeks she has been using the wound VAC with ACEL underneath it. On the right we've simply been using silver alginate. Under Kerlix Coban wraps. 02/28/16; this is a patient I'm currently seeing monthly. She is gone on to have a skin graft over her large venous insufficiency ulcer on the left medial ankle. This was done by Dr. Marla Roe him. The patient is a bit perturbed about why she didn't have one on her right medial ankle wound. She has been using silver alginate to this. 03/06/16; I received a phone call from her plastic surgery Dr. Marla Roe. She expressed some concern about the viability of the skin graft she did on the left medial ankle wound. Asked me to place Endoform on this. She told me she is not planning to do a  subsequent skin graft on the right as the left one did not take very well. I had placed Hydrofera Blue on the right 03/13/16; continue to have a reasonably healthy wound on the right medial ankle. Down to 3 mm in terms of size. There is epithelialization here. The area on the left medial ankle is her skin graft site. I suppose the last week this looks somewhat better. She has an open area inferiorly however in the center there appears to be some viable tissue. There is a lot of surface callus and eschar that will eventually need to come off however none of this looked to be infected. Patient states that the is able to keep the dressing on for several days which is an improvement. 03/20/16 no major change in the circumference of either wound however on the left side the patient was at Dr. Eusebio Friendly office and they did a debridement of left wound. 50% of the wound seems to be epithelialized. I been using Endoform on the left Hydrofera Blue in the right 03/27/16; she arrives today with her wound is not looking as healthy as they did last week. The area on the right clearly has an adherent surface to this a very similar surface on the left. Unfortunately for this patient this is all too familiar problem. Clearly the Endoform is not working and will need to change that today that has some potential to help this surface. She does not tolerate debridement in this clinic very well. She is changing the dressing wants 04/03/16; patient arrives with the wounds looking somewhat better especially on the right. Dr. Migdalia Dk change the dressing to silver alginate when she saw her on Monday and also sold her some compression socks. The usefulness of the latter is really not clear and woman with severely draining wounds. 04/10/16; the patient is doing a bit of an experiment wearing the compression stockings that Dr. Migdalia Dk provided her to her left leg and the out of legs based dressings that we provided to the  right. 05/01/16; the patient is continuing to wear compression stockings Dr. Migdalia Dk provided her on the left that are apparently silver impregnated. She has been using Iodoflex to the right leg wound. Still a moderate amount of drainage, when she leaves here the wraps only last for 4 days. She has to change the stocking on the left leg every night 05/15/16; she is now using compression stockings bilaterally provided by Dr. Marla Roe. She is wearing a nonadherent layer over the wounds  so really I don't think there is anything specific being done to this now. She has some reduction on the left wound. The right is stable. I think all healing here is being done without a specific dressing 06/09/16; patient arrives here today with not much change in the wound certainly in diameter to large circular wounds over the medial aspect of her ankle bilaterally. Under the light of these services are certainly not viable for healing. There is no evidence of surrounding infection. She is wearing compression stockings with some sort of silver impregnation as prescribed by Dr. Marla Roe. She has a follow-up with her tomorrow. 06/30/16; no major change in the size or condition of her wounds. These are still probably covered with a nonviable surface. She is using only her purchase stockings. She did see Dr. Marla Roe who seemed to want to apply Dakin's solution to this I'm not extreme short what value this would be. I would suggest Iodoflex which she still has at home. 07/28/16; I follow Mrs. Horwitz episodically along with Dr. Marla Roe. 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 She has been applying a topical collagen ointment to both wounds with Adaptic. I don't think Dr. Marla Roe is planning to take her back to the OR. 08/19/16; I follow Mrs. Jeneen Rinks on a monthly basis along with Dr. Marla Roe of plastic surgery. She has very refractory venous  insufficiency wounds on the bilateral medial lower legs left greater than right. I been following her for a number of years. At one point I was able to get the right medial malleolus wound to heal and had the left medial malleolus down to about half its current size however and I had to send her to plastic surgery for an operative debridement. Since then things have been stable to slightly improve the area on the right is slightly better one in the left about the same although there is much less adherent surface than I'm used to with this patient. She is using some form of liquid collagen gel that Dr. Marla Roe provided a Kerlix cover with the patient's own pressure stockings. She tells me that she has extreme pain in both ankles and along the lateral aspect of both feet. She has been unable to work for some period of time. She is telling me she is retiring at the beginning of April. She sees Dr. Doran Durand of orthopedics next week 09/22/16; patient has not seen Dr. Marla Roe since the last time she is here. I'm not really sure what she is using to the wounds other than bits and pieces of think she had left over including most recently Hydrofera Blue. She is using juxtalite stockings. She is having difficulty with her husband's recent illness "stroke". She is having to transport him to various doctors appointments. Dr. Marla Roe left her the option of a repeat debridement with ACEL however she has not been able to get the time to follow-up on this. She continues to have a fair amount of drainage out of these wounds with certainly precludes leaving dressings on all week 10/13/16; patient has not seen Dr. Marla Roe since she was last in our clinic. I'm not really sure what she is doing with the wounds, we did try to get her [facility] and I think she is actually using this most of the time. Because of drainage she states she has to change this every second day although this is an improvement from what  she used to do.  She went to see Dr. Doran Durand who did not think she had a muscular issue with regards to her feet, he referred her to a neurologist and I think the appointment is sometime in June. I changed her back to Iodoflex which she has used in the past but not recently. 11/03/16; the patient has been using Iodoflex although she ran out of this. Still claims that there is a lot of drainage although the wound does not look like this. No surrounding erythema. She has not been back to see Dr. Marla Roe 11/24/16; the patient has been using Iodoflex again but she ran out of it 2 or 3 days ago. There is no major change in the condition of either one of these wounds in fact they are larger and covered in a thick adherent surface slough/nonviable tissue especially on the left. She does not tolerate mechanical debridement in our clinic. Going back to see Dr. Marla Roe of plastic surgery for an operative debridement would seem reasonable. 12/15/16; the patient has not been back to see Dr. Marla Roe. She is been dealing with a series of illnesses and her husband which of monopolized her time. She is been using Sorbact which we largely supplied. She states the drainage is bad enough that it maximum she can go 2-3 days without changing the dressing 01/12/2017 -- the patient has not been back for about 4 weeks and has not seen Dr. Marla Roe not does she have any appointment pending. 01/23/17; patient has not seen Dr. Marla Roe even though I suggested this previously. She is using Santyl that was suggested last week by Dr. Con Memos this Cost her $16 through her insurance which is indeed surprising 02/12/17; continuing Santyl and the patient is changing this daily. A lot of drainage. She has not been back to see plastic surgery she is using an Ace wrap. Our intake nurse suggested wrap around stockings which would make a good reasonable alternative 02/26/17; patient is been using Santyl and changing this daily due to  drainage. She has not been to see plastic surgery she uses in April Ace wrap to control the edema. She did obtain extremitease stockings but stated that the edema in her leg was to big for these 03/20/17; patient is using Santyl and Anasept. Surfaces looked better today the area on the right is actually measuring a little smaller. She has states she has a lot of pain in her feet and ankles and is asking for a consult to pain control which I'll try to help her with through our case manager. 04/10/17; the patient arrives with better-looking wound surfaces and is slightly smaller wound on the left she is using a combination of Santyl and Anasept. She has an appointment or at least as started in the pain control center associated with [facility] 05/14/17; this is a patient who I followed for a prolonged period of time. She has venous insufficiency ulcers on her bilateral medial ankles. At one point I had this down to a much smaller wound on the left however these reopened and we've never been able to get these to heal. She has been using Santyl and Anasept gel although 2 weeks ago she ran out of the Anasept gel. She has a stable appearance of the wound. She is going to the wound care clinic at [facility]. They wanted do a nerve block/spinal block although she tells me she is reluctant to go forward with that. 05/21/17; this is a patient I have followed for many years. She has  venous insufficiency ulcers on her bilateral medial ankles. Chronic pain and deformity in her ankles as well. She is been to see plastic surgery as well as orthopedics. Using PolyMem AG most recently/Kerramax/ABDs and 2 layer compression. She has managed to keep this on and she is coming in for a nurse check to change the dressing on Tuesdays, we see her on Fridays 06/05/17; really quite a good looking surface and the area especially on the right medial has contracted in terms of dimensions. Well granulated  healthy-looking tissue on both sides. Even with an open curet there is nothing that even feels abnormal here. This is as good as I've seen this in quite some time. We have been using PolyMem AG and bringing her in for a nurse check 06/12/17; really quite good surface on both of these wounds. The right medial has contracted a bit left is not. We've been using PolyMem and AG and she is coming in for a nurse visit 06/19/17; we have been using PolyMem AG and bringing her in for a nurse check. Dimensions of her wounds are not better but the surfaces looked better bilaterally. She complained of bleeding last night and the left wound and increasing pain bilaterally. She states her wound pain is more neuropathic than just the wounds. There was some suggestion that this was radicular from her pain management doctor in talking to her it is really difficult to sort this out. 06/26/17; using PolyMem and AG and bringing her in for a nurse check as All of this and reasonably stable condition. Certainly not improved. The dimensions on the lateral part of the right leg look better but not really measuring better. The medial aspect on the left is about the same. 07/03/16; we have been using PolyMen AG and bringing her in for a nurse check to change the dressings as the wounds have drainage which precludes once weekly changing. We are using all secondary absorptive dressings.our intake nurse is brought up the idea of using a wound VAC/snap VAC on the wound to help with the drainage to see if this would result in some contraction. This is not a bad idea. The area on the right medial is actually looking smaller. Both wounds have a reasonable-looking surface. There is no evidence of cellulitis. The edema is well controlled 07/10/17; the patient was denied for a snap VAC by her insurance. The major issue with these wounds continues to be drainage. We are using wicked PolyMem AG and she is coming in for a nurse visit to change  this. The wounds are stable to slightly improved. The surface looks vibrant and the area on the right certainly has shrunk in size but very slowly 07/17/17; the patient still has large wounds on her bilateral medial malleoli. Surface of both of these wounds looks better. The dimensions seem to come and go but no consistent improvement. There is no epithelialization. We do not have options for advanced treatment products due to insurance issues. They did not approve of the wound VAC to help control the drainage. More recently we've been using PolyMem and AG wicked to allow drainage through. We have been bringing her in for a nurse visit to change this. We do not have a lot of options for wound care products and the home again due to insurance issues 07/24/17; the patient's wound actually looks somewhat better today. No drainage measurements are smaller still healthy-looking surface. We used silver collagen under PolyMen started last week. We have been bringing her  in for a dressing change 07/31/17; patient's wound surface continued to look better and I think there is visible change in the dimensions of the wound on the right. Rims of epithelialization. We have been using silver collagen under PolyMen and bringing her in for a dressing change. There appears to be less drainage although she is still in need of the dressing change 08/07/17. Patient's wound surface continues to look better on both sides and the area on the right is definitely smaller. We have been using silver collagen and PolyMen. She feels that the drainage has been it has been better. I asked her about her vascular status. She went to see Dr. Aleda Grana at [facility] and had some form of ablation. I don't have much detail on this. I haven't my notes from 2016 that she was not a candidate for any further ablation but I don't have any more information on this. We had referred her to vein and vascular I don't think she ever went. He does not  have a history of PAD although I don't have any information on this either. We don't even have ABIs in our record 08/14/17; we've been using silver collagen and PolyMen cover. And putting the patient and compression. She we are bringing her in as a nurse visit to change this because ofarge amount of drainage. We didn't the ABIs in clinic today since they had been done in many moons 1.2 bilaterally. She has been to see vein and vascular however this was at [facility] and she had ablation although I really don't have any information on this all seemed biking get a report. She is also been operatively debrided by plastic surgery and had a cell placed probably 8-12 months ago. This didn't have a major effect. We've been making some gains with current dressings 08/19/17-She is here in follow-up evaluation for bilateral medial malleoli ulcers. She continues to tolerate debridement very poorly. We will continue with recently changed topical treatment; if no significant improvement may consider switching to Iodosorb/Iodoflex. 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 She has been using silver collagen and PolyMem. I believe she has been used and tried on Iodoflex before. 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 These have been very refractory over time. We have been using silver collagen and PolyMen. She is coming in once a week for a doctor's and once a week for nurse visits. We are actually making some progress 09/18/17; the patient's wounds are smaller especially on the right medial. She arrives today to upset to consider even washing these off with Anasept which I think is been part of the reason this is been closing. We've been using collagen covered in PolyMen  otherwise. It is noted that she has a small area of folliculitis on the right medial calf that. As we are wrapping her legs I'll give her a short course of doxycycline to make sure this doesn't amount to anything. She is a long list of complaints today including imbalance, shortness of breath on exertion, inversion of her left ankle. With regards to the latter complaints she is been to see orthopedics and they offered her a tendon release surgery I believe but wanted her wounds to be closed first. I have recommended she go see her primary physician  with regards to everything else. 09/25/17; patient's wounds are about the same size. We have made some progress bilaterally although not in recent weeks. She will not allow me T wash these wounds with Anasept even if she is doing her cell. Wheeze we've been using collagen covered in PolyMen. Last week she had a small area of folliculitis this is now opened into a small wound. She completed 5 days of trimethoprim sulfamethoxazole 10/02/17; unfortunately the area on her left medial ankle is worse with a larger wound area towards the Achilles. The patient complains of a lot of pain. She will not allow debridement although visually I don't think there is anything to debridement in any case. We have been using silver collagen and PolyMen for several months now. Initially we are making some progress although I'm not really seeing that today. We will move back to [facility]. His admittedly this is a bit of a repeat however I'm hoping that his situation is different now. The patient tells me she had her leg on the left give out on her yesterday this is process some pain. 10/09/17; the patient is seen twice a week largely because of drainage issues coming out of the chronic medial bimalleolar wounds that are chronic. Last week the dimensions of the one on the left looks a little larger I changed her to [facility]. She comes in today with a history of terrible  pain in the bilateral wound areas. She will not allow debridement. She will not even allow a tissue culture. There is no surrounding erythema no no evidence of cellulitis. We have been putting her Kerlix Coban man. She will not allow more aggressive compression as there was a suggestion to put her in 3 layer wraps. 10/16/17; large wounds on her bilateral medial malleoli. These are chronic. Not much change from last week. The surface looks have healthy but absolutely no epithelialization. A lot of pain little less so of drainage. She will not allow debridement or even washing these off in the vigorous fashion with Anasept. 10/23/17; large wounds on her bilateral malleoli which are chronic. Some improvement in terms of size perhaps on the right since last time I saw these. She states that after we increased the 3 layer compression there was some bleeding, when she came in for a nurse visit she did not want 3 layer compression put back on about our nurse managed to convince her. She has known chronic venous visit issues and I'm hoping to get her to tolerate the 3 layer compression. using Hydrofera Blue 10/30/17; absolutely no change in the condition of either wound although we've had some improvement in dimensions on the right.. Attempted to put her in 3 layer compression she didn't tolerated she is back in 2 layer compression. We've been using Hydrofera Blue We looked over her past records. She had venous reflux studies in November 2016. There was no evidence of deep venous reflux on the right. Superficial vein did not show the greater saphenous vein at think this is been previously ablated the small saphenous vein was within normal limits. The left deep venous system showed no DVT the vessels were positive for deep venous reflux in the posterior tibial veins at the ankle. The greater saphenous vein was surgically absent small saphenous vein was within normal limits. She went to vein and vascular at  [facility]. I believe she had an ablation on the left greater saphenous vein. I'll update her reflux studies perhaps ever reviewed by vein  and vascular. We've made absolutely no progress in these wounds. Will also try to read and TheraSkins through her insurance 11/06/17; W the patient apparently has a 2 week follow-up with vein and vascular I like him to review the whole issue with regards to her previous vascular workup by Dr. Aleda Grana. We've really made no progress on these wounds in many months. She arrives today with less viable looking surface on the left medial ankle wound. This was apparently looking about the same on Tuesday when she was here for nurse visit. 11/13/17; deep tissue culture I did last time of the left lower leg showed multiple organisms without any predominating. In particular no Staphylococcus or group A strep were isolated. We sent her for venous reflux studies. She's had a previous left greater saphenous vein stripping and I think sclerotherapy of the right greater saphenous vein. She didn't really look at the lesser saphenous vein this both wounds are on the medial aspect. She has reflux in the common femoral vein and popliteal vein and an accessory vein on the right and the common femoral vein and popliteal vein on the left. I'm going to have her go to see vein and vascular just the look over things and see if anything else beside aggressive compression is indicated here. We have not been able to make any progress on these wounds in spite of the fact that the surface of the wounds is never look too bad. 11/20/17; no major change in the condition of the wounds. Patient reports a large amount of drainage. She has a lot of complaints of pain although enlisting her today I wonder if some of this at least his neuropathic rather than secondary to her wounds. She has an appointment with vein and vascular on 12/30/17. The refractory nature of these wounds in my mind at least  need vein and vascular to look over the wounds the recent reflux studies we did and her history to see if anything further can be done here. I also note her gait is deteriorated quite a bit. 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 She has a bilateral Trendelenburg gait. I wonder if this is neuropathic or perhaps multilevel radicular. 11/27/17; her wounds actually looks slightly better. Healthy-looking granulation tissue a scant amount of epithelialization. [place] healthcare will not pay for [employer]. They will play for tri layer Oasis and Dermagraft. This is not a diabetic ulcer. We'll try for the tri layer Oasis. She still complains of some drainage. She has a vein and vascular appointment on 12/30/17 12/04/17; the wounds visually look quite good. Healthy-looking granulation with some degree of epithelialization. We are still waiting for response to our request for trial to try layer Oasis. Her appointment with vascular to review venous and arterial issues isn't sold the end of July 7/31. Not allow debridement or even vigorous cleansing of the wound surface. 12/18/17; slightly smaller especially on the right. 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 We've been using Hydrofera Blue. We still are looking into triple layer Oasis through her insurance 01/08/18 on evaluation today patient's wound actually appears to be showing signs of good improvement at this point in time. She has been tolerating the dressing changes without complication. Fortunately there does not appear to be any evidence of infection at this point in time. We have been utilizing silver nitrate which does seem to be of benefit for her which is also good news. Overall I'm very happy with how things seem  to be both regards appearance as well as measurement. Patient did see Dr. Bridgett Larsson for evaluation on 12/30/17. In his assessment he felt that stripping would not likely add much more than chronic  compression to the patient's healing process. His recommendation was to follow-up in three months with Dr. Doren Custard if she hasn't healed in order to consider referral back to you and see vascular where she previously was in a trial and was able to get her wound to heal. I'll be see what she feels she when you staying compression and he reiterated this as well. 01/13/18 on evaluation today patient appears to actually be doing very well in regard to her bilateral medial malleolus ulcers. She seems to have tolerated the chemical cauterization with silver nitrate last week she did have some pain through that evening but fortunately states that I'll be see since it seems to be doing better she is overall pleased with the progress. 01/21/18; really quite a remarkable improvement since I've last seen these wounds. We started using silver nitrate specially on the islands of hyper granulation which for some reason her around the wound circumference. This is really done quite nicely. Primary dressing Hydrofera Blue under 4 layer compression. She seems to be able to hold out without a nurse rewrap. Follow-up in 1 week 01/28/18; we've continued the hydrofera blue but continued with chemical cauterization to the wound area that we started about a month ago for irregular hyper granulation. She is made almost stunning improvement in the overall wound dimensions. I was not really expecting this degree of improvement in these chronic wounds 02/05/18; we continue with Hydrofera Bluebut of also continued the aggressive chemical cauterization with silver nitrate. We made nice progress with the right greater than left wound. 02/12/18. We continued with Hydrofera Blue after aggressive chemical cauterization with silver nitrate. We appear to be making nice progress with both wound areas 02/19/2018; we continue with [facility] after washing the wounds vigorously with Anasept spray and chemical cauterization with silver nitrate.  We are making excellent progress. The area on the right's just about closed 02/26/2018. The area on the left medial ankle had too much necrotic debris today. I used a #5 curette we are able to get most of the soft. I continued with the silver nitrate to the much smaller wound on the right medial ankle she had a new area on her right lower pretibial area which she says was due to a role in her compression 03/05/2018; both wound areas look healthy. Not much change in dimensions from last week. I continue to use silver nitrate and Hydrofera Blue. The patient saw Dr. Doren Custard of vein and vascular. He felt she had venous stasis ulcers. He felt based on her previous arteriogram she should have adequate circulation for healing. Also she has deep venous reflux but really no significant correctable superficial venous reflux at this time. He felt we should continue with conservative management including leg elevation and compression 04/02/2018; since we last saw this woman about a month ago she had a fall apparently suffered a pelvic fracture. I did not look up the x-ray. Nevertheless because of pain she literally was bedbound for 2 weeks and had home health coming out to change the dressing. Somewhat predictably this is resulted in considerable improvement in both wound areas. The right is just about closed on the medial malleolus and the left is about half the size. 04/16/2018; both her wounds continue to go down in size. Using Hydrofera Blue. 05/07/18;  both her wounds appeared to be improving especially on the right where it is almost closed. We are using Hydrofera Blue 05/14/2018; slightly worse this week with larger wounds. Surface on the left medial not quite as good. We have been using Hydrofera Blue 05/21/18; again the wounds are slightly larger. Left medial malleolus slightly larger with eschar around the circumference. We have been using Hydrofera Blue undergoing a wraps for a prolonged period of time. This  got a lot better when she was more recumbent due to a fall and a back injury. I change the primary dressing the silver alginate today. She did not tolerate a 4 layer compression previously although I may need to bring this up with her next time 05/28/2018; area on the left medial malleolus again is slightly larger with more drainage. Area on the right is roughly unchanged. She has a small area of folliculitis on the right medial just on the lower calf. This does not look ominous. 06/03/2018 left medial malleolus slightly smaller in a better looking surface. We used silver nitrate on this last time with silver alginate. The area on the right appears slightly smaller 1/10; left medial malleolus slightly smaller. Small open area on the right. We used silver nitrate and silver alginate as of 2 weeks ago. We continue with the wound and compression. These got a lot better when she was off her feet 1/17; right medial malleolus wound is smaller. The left may be slightly smaller. Both surfaces look somewhat better. 1/24; both wounds are slightly smaller. Using silver alginate under Unna boots 1/31; both wounds appear smaller in fact the area on the right medial is just about closed. Surface eschar. We have been using silver alginate under Unna boots. The patient is less active now spends let much less time on her feet and I think this is contributed to the general improvement in the wound condition 2/7; both wounds appear smaller. I was hopeful the right medial would be closed however there there is still the same small open area. Slight amount of surface eschar on the left the dimensions are smaller there is eschar but the wound edges appear to be free. We have been using silver alginate under Unna boot's 2/14; both wounds once again measure smaller. Circumferential eschar on the left medial. We have been using silver alginate under Unna boots with gradual improvement 2/21; the area on the right medial  malleolus has healed. The area on the left is smaller. We have been using silver alginate and Unna boots. We can discharge wrapping the right leg she has 20/30 stockings at home she will need to protect the scar tissue in this area 2/28; the area on the right medial malleolus remains closed the patient has a compression stocking. The area on the left is smaller. We have been using silver alginate and Unna boots. 3/6 the area on the right medial ankle remains closed. Good edema control noted she is using her own compression stocking. The area on the left medial ankle is smaller. We have been managing this with silver alginate and Unna boots which we will continue today. 3/13; the area on the right medial ankle remains closed and I'm declaring it healed today. When necessary the left is about the same still a healthy-looking surface but no major change and wound area. No evidence of infection and using silver alginate under unna and generally making considerable improvement 3/27 the area on the right medial ankle remains closed the area on the left  is about the same as last week. Certainly not any worse we have been using silver alginate under an Unna boot 4/3; the area on the right medial ankle remains closed per the patient. We did not look at this wound. The wound on the left medial ankle is about the same surface looks healthy we have been using silver alginate under an Unna boot 4/10; area on the right medial ankle remains closed per the patient. We did not look at this wound. The wound on the left medial ankle is slightly larger. The patient complains that the [facility] caused burning pain all week. She also told us that she was a lot more active this week. Changed her back to silver alginate 4/17; right medial ankle still closed per the patient. Left medial ankle is slightly larger. Using silver alginate. She did not tolerate Hydrofera Blue on this area 4/24; right medial ankle remains  closed we have not look at this. The left medial ankle continues to get larger today by about a centimeter. We have been using silver alginate under Unna boots. She complains about 4 layer compression as an alternative. She has been up on her feet working on her garden 5/8; right medial ankle remains closed we did not look at this. The left medial ankle has increased in size about 100%. We have been using silver alginate under Unna boots. She noted increased pain this week and was not surprised that the wound is deteriorated 5/15; no major change in SA however much less erythema ( one week of doxy ocellulitis). 5/22-65 year old female returns at 1 week to the clinic for left medial ankle wound for which we have been using silver alginate under 3 layer compression She was placed on DOXY at last visit - the wound is wider at this visit. She is in 3 layer compression 5/29; change to [facility] last week. I had given her empiric doxycycline 2 weeks ago for a week. She is in 3 layer compression. She complains of a lot of pain and drainage on presentation today. 6/5; using Hydrofera Blue. I gave her doxycycline recently empirically for erythema and pain around the wound. Believe her cultures showed enterococcus which not would not have been well covered by doxycycline nevertheless the wound looks better and I don't feel specifically that the enterococcus needs to be covered. She has a new what looks like a wrap injury on her lateral left ankle. 6/12; she is using Hydrofera Blue. She has a new area on the left anterior lower tibial area. This was a wrap injury last week. 6/19; the patient is using Hydrofera Blue. She arrived with marked inflammation and erythema around the wound and tenderness. 12/01/18 on evaluation today patient appears to be doing a little bit better based on what I'm hearing from the standpoint of lassos evaluation to this as far as the overall appearance of the wound is concerned.  Then sometime substandard she typically sees Dr. Dellia Nims. Nonetheless overall very pleased with the progress that she's made up to this point. No fevers, chills, nausea, or vomiting noted at this time. 7/10; some improvement in the surface area. Aggressively debrided last week apparently. I went ahead with the debridement today although the patient does not tolerate this very well. We have been using Iodoflex. Still a fair amount of drainage 7/17; slightly smaller. Using Iodoflex. 7/24; no change from last week in terms of surface area. We have been using Iodoflex. Surface looks and continues to look somewhat better  7/31; surface area slightly smaller better looking surface. We have been using Iodoflex. This is under Unna boot compression 8/7-Patient presents at 1 week with Unna boot and Iodoflex, wound appears better 8/14-Patient presents at 1 week with Iodoflex, we use the Unna boot, wound appears to be stable better.Patient is getting Botox treatment for the inversion of the foot for tendon release, Next week 8/21; we are using Iodoflex. Unna boot. The wound is stable in terms of surface area. Under illumination there is some areas of the wound that appear to be either epithelialized or perhaps this is adherent slough at this point I was not really clear. It did not wipe off and I was reluctant to debride this today. 8/28; we are using Iodoflex in an Unna boot. Seems to be making good improvement. 9/4; using Iodoflex and wound is slightly smaller. 9/18; we are using Iodoflex with topical silver nitrate when she is here. The wound continues to be smaller 10/2; patient missed her appointment last week due to GI issues. She left and Iodoflex based dressing on for 2 weeks. Wound is about the same size about the size of a dime on the left medial lower 10/9 we have been using Iodoflex on the medial left ankle wound. She has a new superficial probable wrap injury on the dorsal left ankle 10/16; we have  been using Hydrofera Blue since last week. This is on the left medial ankle 10/23; we have been using Hydrofera Blue since 2 weeks ago. This is on the left medial ankle. Dimensions are better 11/6; using Hydrofera Blue. I think the wound is smaller but still not closed. Left medial ankle 11/13; we have been using Hydrofera Blue. Wound is certainly no smaller this week. Also the surface not as good. This is the remanent of a very large area on her left medial ankle. 11/20; using Sorbact since last week. Wound was about the same in terms of size although I was disappointed about the surface debris 12/11; 3-week follow-up. Patient was on vacation. Wound is measuring slightly larger we have been using Sorbact. 12/18; wound is about the same size however surface looks better last week after debridement. We have been using Sorbact under compression 1/15 wound is probably twice the size of last time increased in length nonviable surface. We have been using Sorbact. She was running a mild fever and missed her appointment last week 1/22; the wound is come down in size but under illumination still a very adherent debris we have been Hydrofera Blue that I changed her to last week 1/29; dimensions down slightly. We have been using Hydrofera Blue 2/19 dimensions are the same however there is rims of epithelialization under illumination. Therefore more the surface area may be epithelialized 2/26; the patient's wound actually measures smaller. The wound looks healthy. We have been using Hydrofera Blue. I had some thoughts about running Apligraf then I still may do that however this looks so much better this week we will delay that for now 3/5; the wound is small but about the same as last week. We have been using Hydrofera Blue. No debridement is required today. 3/19; the wound is about the size of a dime. Healthy looking wound even under illumination. We have been using Hydrofera Blue. No mechanical  debridement is necessary 3/26; not much change from last week although still looks very healthy. We have been using Hydrofera Blue under Unna boots Patient was offered an ankle fusion by podiatry but not until the wound  heals with a proceed with this. 4/9; the patient comes in today with her original wound on the medial ankle looking satisfactory however she has some uncontrolled swelling in the middle part of her leg with 2 new open areas superiorly just lateral to the tibia. I think this was probably a wrap issue. She said she felt uncomfortable during the week but did not call in. We have been using Hydrofera Blue 4/16; the wound on the medial ankle is about the same. She has innumerable small areas superior to this across her mid tibia. I think this is probably folliculitis. She is also been working in the yard doing a lot of sweating 4/30; the patient issue on the upper areas across her mid tibia of all healed. I think this was excessive yard work if I remember. Her wound on the medial ankle is smaller. Some debris on this we have been using Hydrofera Blue under Unna boots 5/7; mid tibia. She has been using Hydrofera Blue under an Unna wrap. She is apparently going for her ankle surgery on June 3 10/28/19-Patient returns to clinic with the ankle wound, we are using Hydrofera Blue under Unna wrap, surgery is scheduled for her left foot for June 3 so she will be back for nurse visit next week READMISSION 01/17/2020 Mrs. Seacat is a 65 year old woman we have had in this clinic for a long period of time with severe venous hypertension and refractory wounds on her medial lower legs and ankles bilaterally. 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 When she was off her legs for a prolonged period example when she fell and suffered a compression fracture things would heal up quite  nicely. She is now retired and we managed to heal up the right medial leg wound. The left one was very tiny last time I saw this although still refractory. 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 It got to the point where this was interfering with her walking and she elected to proceed with a ankle arthrodesis to straighten her her ankle and leave her with a functional outcome for mobilization. The patient was referred to Dr. Doren Custard and really this took some time to arrange. Dr. Doren Custard saw her on 12/07/2019. Once again he verified that she had no arterial issues. She had previously had an angiogram several years ago. Follow-up ABIs on the left showed an ABI of 1.12 with triphasic waveforms and a TBI of 0.92. She is felt to have chronic deep venous insufficiency but I do not think it was felt that anything could be done from about this from an ablation point of view. At the time Dr. Doren Custard saw this patient the wounds actually look closed via the pictures in his clinic. The patient finally underwent her surgery on 12/15/2019. This went reasonably well and there was a good anatomic outcome. She developed a small distal wound dehiscence on the lateral part of the surgical wound. However more problematically she is developed recurrence of the wound on the medial left ankle. There are actually 2 wounds here one in the distal lower leg and 1 pretty much at the level of the medial malleolus. It is a more distal area that is more problematic. She has been using Hydrofera Blue which started on Friday before this  she was simply Ace wrapping. There was a culture done that showed Pseudomonas and she is on ciprofloxacin. A recent CNS on 8/11 was negative. The patient reports some pain but I generally think this is improving. She is using a cam boot completely nonweightbearing using a walker for pivot transfers and a  wheelchair 8/24; not much improvement unfortunately she has a surgical wound on the lateral part in the venous insufficiency wound medially. The bottom part of the medial insufficiency wound is still necrotic there is exposed tendon here. We have been using Hydrofera Blue under compression. Her edema control is however better 8/31; patient in for follow-up of his surgical wound on the lateral part of her left leg and chronic venous insufficiency ulcers medially. We put her back in compression last week. She comes in today with a complaint of 3 or 4 days worth of increasing pain. She felt her cam walker was rubbing on the area on the back of her heel. However there is intense erythema seems more likely she has cellulitis. She had 2 cultures done when she was seeing podiatry in the postop. One of them in late July showed Pseudomonas and she received a course of ciprofloxacin the other was negative on 8/11 she is allergic to penicillin with anaphylactoid complaints of hives oral swelling via information in epic 9/9; when I saw this patient last week she had intense anterior erythema around her wound on the right lateral heel and ankle and also into the right medial heel. Some of this was no doubt drainage and her walker boot however I was convinced she had cellulitis. I gave her Levaquin and Bactrim she is finishing up on this now. She is following up with Dr. [name] he saw her yesterday. He is taken her out of the walking boot of course she is still nonweightbearing. Her x-ray was negative for any worrisome features such as soft tissue air etc. Things are a lot better this week. She has home health. We have been using Hydrofera Blue under an [employer] which she put back on yesterday. I did not wrap her last week 9/17; her surrounding skin looks a lot better. In fact the area on the left lateral ankle has just a scant amount of eschar. The only remaining wound is the large area on the left medial ankle.  Probably about 60% of this is healthy granulation at the surface however she has a significant divot distally. This has adherent debris in it. I been using debridement and silver collagen to try and get this area to fill-in although I do not think we have made much progress this week 9/24; the patient's wound on the left medial ankle looks a lot better. The deeper divot area distally still requires debridement but this is cleaning up quite nicely we have been using silver collagen. The patient is complaining of swelling in her foot and is worried that that is contributing to the nonhealing of the ankle wound. She is also complaining of numbness in her anterior toes 10/4; left medial ankle. The small area distally still has a divot with necrotic material that I have been debriding away. This has an undermining area. She is approved for Apligraf. She saw Dr. [name] her surgeon on 10/1. I think he declared himself is satisfied with the condition of things. Still nonweightbearing till the end of the month. 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 Her surgical wound is well closed. There is no  evidence of infection 10/11; the patient arrived in clinic today with the expectation that we be able to put an Apligraf on this area after debridement however she arrives with a relatively large amount of green drainage on the dressing. The patient states that this started on Friday. She has not been systemically unwell. 10/19; culture I did last week showed both Enterococcus and Pseudomonas. I think this came in separate parts because I stopped her ciprofloxacin I gave her and prescribed her linezolid however now looking at the final culture result this is Pseudomonas which is resistant to quinolones. She has not yet picked up the linezolid apparently phone issues. We are also trying to get a topical antibiotic out of [place] in [place] they can be applied by home health. She  is still having green drainage 10/16; the patient has her topical antibiotic from [facility] in [place]. This is a compounded gel with vancomycin and ciprofloxacin and gentamicin. We are applying this on the wound bed with silver alginate over the top with Unna boot wraps. She arrives in clinic today with a lot less ominous looking drainage although she is only use this topical preparation once the second time today. She sees Dr. [name] her surgeon on Friday she has home health changing the dressing 11/2; still using her compounded topical antibiotic under silver alginate. Surface is cleaning up there is less drainage. We had an Apligraf for her today and I elected to apply it. A light coating of her antibiotic 04/25/2020 upon evaluation today patient appears to be doing well with regard to her ankle ulcer. There is a little bit of slough noted on the surface of the wound I am can have to perform sharp debridement to clear this away today. With that being said other than that fact overall I feel like she is making progress and we do see some new epithelial growth. There is also some improvement in the depth of the wound and that distal portion. There is little bit of slough there as well. 12/7; 2-week follow-up. Apligraf #3. Dimensions are smaller. Closing in especially inferiorly. Still some surface debris. Still using the [facility] topical antibiotic but I told her that I don't think this needs to be renewed 12/21; 2-week follow-up. Apligraf #4 dimensions are smaller. Nice improvement 06/05/2020; 2-week follow-up. The patient's wound on the left medial ankle looks really excellent. Nice granulation. Advancing epithelialization no undermining no evidence of infection. We would have to reapply for another Apligraf but with the condition of this wound I did not feel strongly about it. We used Hydrofera Blue under the same degree of compression. She follows up with Dr. [name] her surgeon a week  Friday 06/13/2020 upon evaluation today patient appears to be doing excellent in regard to her wound. She has been tolerating the dressing changes without complication. Fortunately there is no signs of active infection at this time. No fevers, chills, nausea, vomiting, or diarrhea. She was using Hydrofera Blue last week. 06/20/2020 06/20/2020 on evaluation today patient actually appears to be doing excellent in regard to her wound. This is measuring better and looking much better as well. She has been using the collagen that seems to be doing better for her as well even though the [facility] was and is not sticking or feeling as rough on her wound. She did see Dr. [name] on Friday he is very pleased he also stated none of the hardware has shifted. That is great news 1/27; the patient has a  small clean wound all that is remaining. I agree that this is too small to really consider an Apligraf. Under illumination the surface is looking quite good. We have been using collagen although the dimensions are not any better this week 2/2; the patient has a small clean wound on the left medial ankle. Although this left of her substantial original areas. Measurements are smaller. We have been using polymen Ag under an Haematologist. 2/10; small area on the left medial ankle. This looks clean nothing to debride however dimensions are about the same we have been using polymen I think now for 2 weeks 2/17; not much change in surface area. We have been using polymen Ag without any improvement. 3/17; 1 month follow-up. The patient has been using endoform without any improvement in fact I think this looks worse with more depth and more expansion 3/24; no improvement. Perhaps less debris on the surface. We have been using Sorbact for 1 week 4/4; wound measures larger. She has edema in her leg and her foot which she tells as her wrap came down. We have been using Unna boots. Sorbact of the wound. She has been approved for  Apligraf 09/12/2020 upon evaluation today patient appears to be doing well with regard to her wound. We did get the Apligraf reapproved which is great news we have that available for application today. Fortunately there is no signs of infection and overall the patient seems to be doing great. The wound bed is nice and clean. 4/27; patient presents for her second application of Apligraf. She states over the past week she has been on her feet more often due to being outside in her garden. She has noted more swelling to her foot as a result. She denies increased warmth, pain or erythema to the wound site. 10/10/2020 upon evaluation today patient appears to be doing well with regard to her wound which does not appear to be quite as irritated as last week from what I am hearing. With that being said unfortunately she is having issues with some erythema and warmth to touch as well as an increase size. I do believe this likely is infected. 10/17/2020 upon evaluation today patient appears to be doing excellent in regard to her wound this is significantly improved as compared to last week. Fortunately I think that the infection is much better controlled at this point. She did have evidence of both Enterococcus as well as Staphylococcus noted on culture. Enterococcus really would not be helped significantly by the Cipro but the wound is doing so much better I am under the assumption that the Staphylococcus is probably the main organism that is causing the current infection. Nonetheless I think that she is doing excellent as far as that is concerned and I am very pleased in that regard. I would therefore recommend she continue with the Cipro. 10/31/2020 upon evaluation today patient appears to be doing well with regard to her wound. She has been tolerating the dressing changes without complication. Fortunately there is no signs of active infection and overall I am extremely pleased with where things stand today. No  fevers, chills, nausea, vomiting, or diarrhea. With that being said she does have some green drainage coming from the wound and although it looks okay I am a little concerned about the possibility of a continuing infection. Specifically with Pseudomonas. For that reason I will go ahead and send in a prescription for Cipro for her to be continued. 11/14/2020 upon evaluation today patient appears  to be doing very well currently in regard to her wound on her leg. She has been tolerating the dressing changes without complication. Fortunately I feel like the infection is finally under good control here. Unfortunately we do not have the Apligraf for application today although we can definitely order to have it in place for next week. That will be her fifth and final of the current series. Nonetheless I feel like her wound is really doing quite well which is great news. 11/21/2020 upon evaluation today patient appears to be doing well with regard to her wound on the medial ankle. Fortunately I think the infection is under control and I do believe we can go ahead and reapply the Apligraf today. She is in agreement with that plan. There does not appear to be any signs of active infection at this time which is great news. No fevers, chills, nausea, vomiting, or diarrhea. 12/05/2020 upon evaluation today patient's wound bed actually showed signs of good granulation epithelization at this point. There does not appear to be any signs of infection which is great news and overall very pleased with where things stand. Overall the patient seems to be doing fairly well in my opinion with regard to her wound although I do believe she continues to build up a lot of biofilm I think she could benefit from using PuraPly at this point. 12/12/2020 upon evaluation today patient's wound actually appears to be doing decently well today. The Unna boot has not been quite as well-tolerated so that more uncomfortable for her and even  causing some pressure over the plate on the lateral portion of her foot which is 90 where the wound is. There did not appear to be any significant deep tissue injury with that there may be a minimal change in the skin noted I think that we may want to go back to the Coflex 2 layer which is a little bit easier on her skin it seems. 12/19/2020 upon evaluation today patient actually seems to be making great progress with the PuraPly currently. She in fact seems to be much better as far as the overall appearance of the wound bed is concerned I am very happy in this regard. I do not see any signs of of infection which is great news as well. No fevers, chills, nausea, vomiting, or diarrhea. 12/26/2020 upon evaluation today patient appears to actually be doing better in regard to her wound on the left medial ankle region. The surface of the wound is actually doing significantly better which is great news. There does not appear to be any signs of infection which is also great news and in general I am extremely pleased with where we stand today. 01/02/2021 upon evaluation today patient appears to be doing well with regard to her wound. In fact this is showing signs of excellent improvement and very pleased with where things stand. In fact the last 3 appointments have all shown signs of this getting smaller which is excellent news. I have not even had to perform any debridement and today is no exception. Overall I feel like this is dramatically improved compared to previous. T oday is PuraPly application #4. [id number] upon evaluation today patient appears to be doing excellent in regard to her wound this is continue to show signs of improvement and overall I am extremely pleased with where we stand today. She is actually here for PuraPly application #5. Every time we have applied this she is noted definite improvement on measurements. Electronic  Signature(s) Signed: 01/10/2021 5:43:48 PM By: [name]  PA-C Entered By: [name] on 01/10/2021 17:43:48 -------------------------------------------------------------------------------- Physical Exam Details Patient Name: Date of Service: [facility] [name], [name] 01/09/2021 10:45 A M Medical Record Number: [id number] Patient Account Number: [id number] Date of Birth/Sex: Treating RN: [birth year] (65 y.o. [name] Primary Care Provider: [name] Other Clinician: Referring Provider: Treating Provider/Extender: [name] in Treatment: 44 Constitutional Well-nourished and well-hydrated in no acute distress. Respiratory normal breathing without difficulty. Psychiatric this patient is able to make decisions and demonstrates good insight into disease process. Alert and Oriented x 3. pleasant and cooperative. Notes Upon inspection patient's wound bed showed some signs of good healing and overall I am extremely pleased with where we stand there does not appear to be any evidence of active infection which is great news. No fevers, chills, nausea, vomiting, or diarrhea. Electronic Signature(s) Signed: 01/10/2021 5:44:10 PM By: [name] PA-C Entered By: [name] on 01/10/2021 17:44:09 -------------------------------------------------------------------------------- Physician Orders Details Patient Name: Date of Service: [facility] [name], [name] 01/09/2021 10:45 A M Medical Record Number: [id number] Patient Account Number: [id number] Date of Birth/Sex: Treating RN: 04/12/1956 (65 y.o. [name] Primary Care Provider: [name] Other Clinician: Referring Provider: Treating Provider/Extender: [name] in Treatment: 25 Verbal / Phone Orders: No Diagnosis Coding ICD-10 Coding Code Description [phone] Chronic venous hypertension (idiopathic) with ulcer and inflammation of left lower extremity L97.828 Non-pressure chronic ulcer of other part of left lower leg  with other specified severity L97.328 Non-pressure chronic ulcer of left ankle with other specified severity Follow-up Appointments ppointment in 2 weeks. - Wed with Margarita Grizzle - puraply Return A Nurse Visit: - Monday 8/15 - Cellular or Tissue Based Products Cellular or Tissue Based Product Type: - PuraPly: #5 daptic or Mepitel. (DO NOT REMOVE). - no Cellular or Tissue Based Product applied to wound bed, secured with steri-strips, cover with A steristrips Bathing/ Shower/ Hygiene May shower with protection but do not get wound dressing(s) wet. Edema Control - Lymphedema / SCD / Other Elevate legs to the level of the heart or above for 30 minutes daily and/or when sitting, a frequency of: - throughout the day Avoid standing for long periods of time. Exercise regularly Compression stocking or Garment 20-30 mm/Hg pressure to: - right leg daily Additional Orders / Instructions Follow Nutritious Diet Non Wound Condition pply the following to affected area as directed: - ketoconozole mixed with TCA cream to rash on right calf A Wound Treatment Wound #15 - Malleolus Wound Laterality: Left, Medial Cleanser: Soap and Water 1 x Per Week/30 Days Discharge Instructions: May shower and wash wound with dial antibacterial soap and water prior to dressing change. Peri-Wound Care: Triamcinolone 15 (g) 1 x Per Week/30 Days Discharge Instructions: Use triamcinolone 15 (g) mixed with lotion Peri-Wound Care: Sween Lotion (Moisturizing lotion) 1 x Per Week/30 Days Discharge Instructions: Apply moisturizing lotion as directed Prim Dressing: Endoform 2x2 in 1 x Per Week/30 Days ary Discharge Instructions: Moisten with saline, use at nurse visit on 8/15 Prim Dressing: PuraPly AM ary 1 x Per Week/30 Days Secondary Dressing: Woven Gauze Sponge, Non-Sterile 4x4 in 1 x Per Week/30 Days Discharge Instructions: Apply over primary dressing as directed. Secondary Dressing: ABD Pad, 5x9 1 x Per Week/30 Days Discharge  Instructions: Apply over primary dressing and lateral ankle Secondary Dressing: Optifoam Non-Adhesive Dressing, 4x4 in 1 x Per Week/30 Days Discharge Instructions:  Apply over primary dressing pad lateral foot with foam donut Secondary Dressing: ADAPTIC TOUCH 3x4.25 in 1 x Per Week/30 Days Discharge Instructions: Apply over skin sub Compression Wrap: CoFlex TLC XL 2-layer Compression System 4x7 (in/yd) 1 x Per Week/30 Days Discharge Instructions: Apply CoFlex 2-layer compression as directed. (alt for 4 layer) Electronic Signature(s) Signed: 01/09/2021 3:55:35 PM By: Baruch Gouty RN, BSN Signed: 01/10/2021 5:47:57 PM By: [name] PA-C Entered By: Baruch Gouty on 01/09/2021 15:16:10 -------------------------------------------------------------------------------- Problem List Details Patient Name: Date of Service: [name], [name] 01/09/2021 10:45 A M Medical Record Number: [id number] Patient Account Number: [id number] Date of Birth/Sex: Treating RN: January 03, 1956 (65 y.o. [name] Primary Care Provider: [name] Other Clinician: Referring Provider: Treating Provider/Extender: [name] in Treatment: 72 Active Problems ICD-10 Encounter Code Description Active Date MDM Diagnosis I87.332 Chronic venous hypertension (idiopathic) with ulcer and inflammation of left 01/17/2020 No Yes lower extremity L97.828 Non-pressure chronic ulcer of other part of left lower leg with other specified 01/17/2020 No Yes severity L97.328 Non-pressure chronic ulcer of left ankle with other specified severity 01/17/2020 No Yes Inactive Problems ICD-10 Code Description Active Date Inactive Date L03.116 Cellulitis of left lower limb 01/31/2020 01/31/2020 T81.31XD Disruption of external operation (surgical) wound, not elsewhere classified, subsequent 01/17/2020 01/17/2020 encounter Resolved Problems Electronic Signature(s) Signed: 01/09/2021 11:14:50 AM By:  [name] PA-C Entered By: [name] on 01/09/2021 11:14:50 -------------------------------------------------------------------------------- Progress Note Details Patient Name: Date of Service: [facility] [name], [name] 01/09/2021 10:45 A M Medical Record Number: [id number] Patient Account Number: [id number] Date of Birth/Sex: Treating RN: May 20, 1956 (65 y.o. [name] Primary Care Provider: [name] Other Clinician: Referring Provider: Treating Provider/Extender: [name] in Treatment: 93 Subjective Chief Complaint Information obtained from Patient patient is been followed long-term in this clinic for venous insufficiency ulcers with inflammation, hypertension and ulceration over the medial ankle bilaterally. 01/17/2020; this is a patient who is here for review of postoperative wounds on the left lateral ankle and recurrence of venous stasis ulceration on the left medial History of Present Illness (HPI) the remaining wound is over the left medial ankle. Similar wound over the right medial ankle healed largely with use of Apligraf. Most recently we have been using Hydrofera Blue over this wound with considerable improvement. The patient has been extensively worked up in the past for her venous insufficiency and she is not a candidate for antireflux surgery although I have none of the details available currently. 08/24/14; considerable improvement today. About 50% of this wound areas now epithelialized. The base of the wound appears to be healthier granulation.as opposed to last week when she had deteriorated a considerable improvement 08/17/14; unfortunately the wound has regressed somewhat. The areas of epithelialization from the superior aspect are not nearly as healthy as they were last week. The patient thinks her Hydrofera Blue slipped. 09/07/14; unfortunately the area has markedly regressed in the 2 weeks since I've seen this. There is an  odor surrounding erythema. The healthy granulation tissue that we had at the base of the wound now is a dusky color. The nurse reports green drainage 09/14/14; the area looks somewhat better than last week. There is less erythema and less drainage. The culture I did did not show any growth. Nevertheless I think it is better to continue the Cipro and doxycycline for a further week. The remaining wound area was debridement. 09/21/14. Wound did not require  debridement last week. Still less erythema and less drainage. She can complete her antibiotics. The areas of epithelialization in the superior aspect of the wound do not look as healthy as they did some weeks ago 10/05/14 continued improvement in the condition of this wound. There is advancing epithelialization. Less aggressive debridement required 10/19/14 continued improvement in the condition and volume of this wound. Less aggressive debridement to the inferior part of this to remove surface slough and fibrinous eschar 11/02/14 no debridement is required. The surface granulation appears healthy although some of her islands of epithelialization seem to have regressed. No evidence of infection 11/16/14; lites surface debridement done of surface eschar. The wound does not look to be unhealthy. No evidence of infection. Unfortunately the patient has had podiatry issues in the right foot and for some reason has redeveloped small surface ulcerations in the medial right ankle. Her original presentation involved wounds in this area 11/23/14 no debridement. The area on the right ankle has enlarged. The left ankle wound appears stable in terms of the surface although there is periwound inflammation. There has been regression in the amount of new skin 11/30/14 no debridement. Both wound areas appear healthy. There was no evidence of infection. The the new area on the right medial ankle has enlarged although that both the surfaces appear to be stable. 12/07/14;  Debridement of the right medial ankle wound. No no debridement was done on the left. 12/14/14 no major change in and now bilateral medial ankle wounds. Both of these are very painful but the no overt evidence of infection. She has had previous venous ablation 12/21/14; patient states that her right medial ankle wound is considerably more painful last week than usual. Her left is also somewhat painful. She could not tolerate debridement. The right medial ankle wound has fibrinous surface eschar 12/28/14 this is a patient with severe bilateral venous insufficiency ulcers. For a considerable period of time we actually had the one on the right medial ankle healed however this recently opened up again in June. The left medial ankle wound has been a refractory area with some absent flows. We had some success with Hydrofera Blue on this area and it literally closed by 50% however it is recently opened up Foley. Both of these were debridement today of surface eschar. She tolerates this poorly 01/25/15: No change in the status of this. Thick adherent escar. Very poor tolerance of any attempt at debridement. I had healed the right medial malleolus wound for a considerable amount of time and had the left one down to about 50% of the volume although this is totally regressed over the last 48 weeks. Further the right leg has reopened. she is trying to make a appointment with pain and vascular, previous ablations with Dr. Aleda Grana. I do not believe there is an arterial insufficiency issue here 02/01/15 the status of the adherent eschar bilaterally is actually improved. No debridement was done. She did not manage to get vascular studies done 02/08/15 continued debridement of the area was done today. The slough is less adherent and comes off with less pressure. There is no surrounding infection peripheral pulses are intact 02/15/15 selective debridement with a disposable curette. Again the slough is less adherent and  comes off with less difficulty. No surrounding infection peripheral pulses are intact. 02/22/15 selective debridement of the right medial ankle wound. Slough comes off with less difficulty. No obvious surrounding infection peripheral pulses are intact I did not debridement the one on the left. Both  of these are stable to improved 03/01/15 selective debridement of both wound areas using a curette to. Adherent slough cup soft with less difficulty. No obvious surrounding infection. The patient tells me that 2 days ago she noted a rash above the right leg wrap. She did not have this on her lower legs when she change this over she arrives with widespread left greater than right almost folliculitis-looking rash which is extremely pruritic. I don't see anything to culture here. There is no rash on the rest of her body. She feels well systemically. 03/08/15; selective debridement of both wounds using a curette. Base of this does not look unhealthy. She had limegreen drainage coming out of the left leg wound and describes a lot of drainage. The rash on her left leg looks improved to. No cultures were done. 03/22/15; patient was not here last week. Basal wounds does not look healthy and there is no surrounding erythema. No drainage. There is still a rash on the left leg that almost looks vasculitic however it is clearly limited to the top of where the wrap would be. 04/05/15; on the right required a surgical debridement of surface eschar and necrotic subcutaneous tissue. I did not debridement the area on the left. These continue to be large open wounds that are not changing that much. We were successful at one point in healing the area on the right, and at the same time the area on the left was roughly half the size of current measurements. I think a lot of the deterioration has to do with the prolonged time the patient is on her feet at work 04/19/15 I attempted-like surface debridement bilaterally she does not  tolerate this. She tells me that she was in allergic care yesterday with extreme pain over her left lateral malleolus/ankle and was told that she has an "sprain" 05/03/15; large bilateral venous insufficiency wounds over the medial malleolus/medial aspect of her ankles. She complains of copious amounts of drainage and his usual large amounts of pain. There is some increasing erythema around the wound on the right extending into the medial aspect of her foot to. historically she came in with these wounds the right one healed and the left one came down to roughly half its current size however the right one is reopened and the left is expanded. 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 05/10/15 large bilateral venous insufficiency wounds. There is less adherence surface left however the surface culture that I did last week grew pseudomonas therefore bilateral selective debridement score necessary. There is surrounding erythema. The patient describes severe bilateral drainage and a lot of pain in the left ankle. Apparently her podiatrist was were ready to do a cortisone shot 05/17/15; the patient complains of pain and again copious amounts of drainage. 05/24/15; we used Iodo flex last week. Patient notes considerable improvement in wound drainage. Only needed to change this once. 05/31/15; we continued Iodoflex; the base of these large wounds bilaterally is not too bad but there is probably likely a significant bioburden here. I would like to debridement just doesn't tolerate it. 06/06/14 I would like to continue the Iodoflex although she still hasn't managed to obtain supplies. She has bilateral medial malleoli or large wounds which are mostly superficial. Both of them are covered circumferentially with some nonviable fibrinous slough although she tolerates debridement very poorly. She apparently has an appointment for an ablation on the right  leg by  interventional radiology. 06/14/15; the patient arrives with the wounds and static condition. We attempted a debridement although she does not do well with this secondary to pain. I 07/05/15; wounds are not much smaller however there appears to be a cleaner granulating base. The left has tight fibrinous slough greater than the right. Debridement is tolerated poorly due to pain. Iodoflex is done more for these wounds in any of the multitude of different dressings I have tried on the left 1 and then subsequently the right. 07/12/15; no change in the condition of this wound. I am able to do an aggressive debridement on the right but not the left. She simply cannot tolerate it. We have been using Iodoflex which helps somewhat. It is worthwhile remembering that at one point we healed the right medial ankle wound and the left was about 25% of the current circumference. We have suggested returning to vascular surgery for review of possible further ablations for one reason or another she has not been able to do this. 07/26/15 no major change in the condition of either wound on her medial ankle. I did not attempt to debridement of these. She has been aggressively scrubbing these while she is in the shower at home. She has her supply of Iodoflex which seems to have done more for these wounds then anything I have put on recently. 08/09/15 wound area appears larger although not verified by measurements. Using Iodoflex 09/05/2015 -- she was here for avisit today but had significant problems with the wound and I was asked to see her for a physician opinion. I have summarize that this lady has had surgery on her left lower extremity about 10 years ago where the possible veins stripping was done. She has had an opinion from interventional radiology around November 2016 where no further sclerotherapy was ordered. The patient works 12 hours a day and stands on a concrete floor with work boots and is unable to get the proper  compression she requires and cannot elevate her limbs appropriately at any given time. She has recently grown Pseudomonas from her wound culture but has not started her ciprofloxacin which was called in for her. 09/13/15 this continues to be a difficult situation for this patient. At one point I had this wound down to a 1.5 x 1.5" wound on her left leg. This is deteriorated and the right leg has reopened. She now has substantial wounds on her medial calcaneus, malleoli and into her lower leg. One on the left has surface eschar but these are far too painful for me to debridement here. She has a vascular surgery appointment next week to see if anything can be done to help here. I think she has had previous ablations several years ago at [facility]. She has no major edema. She tells me that she did not get product last time [facility]] and went for several days without it. She continues to work in work boots 12 hours a day. She cannot get compression/4-layer under her work boots. 09/20/15 no major change. Periwound edema control was not very good. Her point with pain and vascular is next Wednesday the 25th 09/28/15; the patient is seen vascular surgery and is apparently scheduled for repeat duplex ultrasounds of her bilateral lower legs next week. 10/05/15; the patient was seen by Dr. Doren Custard of vascular surgery. He feels that she should have arterial insufficiency excluded as cause/contributed to her nonhealing stage she is therefore booked for an arteriogram. She has apparently  monophasic signals in the dorsalis pedis pulses. She also of course has known severe chronic venous insufficiency with previous procedures as noted previously. I had another long discussion with the patient today about her continuing to work 12 hour shifts. I've written her out for 2 months area had concerns about this as her work location is currently undergoing significant turmoil and this may lead to her termination. She is aware  of this however I agree with her that she simply cannot continue to stand for 12 hours multiple days a week with the substantial wound areas she has. 10/19/15; the Dr. Doren Custard appointment was largely for an arteriogram which was normal. She does not have an arterial issue. He didn't make a comment about her chronic venous insufficiency for which she has had previous ablations. Presumably it was not felt that anything additional could be done. The patient is now out of work as I prescribed 2 weeks ago. Her wounds look somewhat less aggravated presumably because of this. I felt I would give debridement another try today 10/25/15; no major change in this patient's wounds. We are struggling to get her product that she can afford into her own home through her insurance. 11/01/15; no major change in the patient's wounds. I have been using silver alginate as the most affordable product. I spoke to Dr. Marla Roe last week with her requested take her to the OR for surgical debridement and placement of ACEL. Dr. Marla Roe told me that she would be willing to do this however [facility] will not cover this, fortunately the patient has [place] healthcare of some variant 11/08/15; no major change in the patient's wounds. She has been completely nonviable surface that this but is in too much pain with any attempted debridement are clinic. I have arranged for her to see Dr. Marla Roe ham of plastic surgery and this appointment is on Monday. I am hopeful that they will take her to the OR for debridement, possible ACEL ultimately possible skin graft 11/22/15 no major change in the patient's wounds over her bilateral medial calcaneus medial malleolus into the lower legs. Surface on these does not look too bad however on the left there is surrounding erythema and tenderness. This may be cellulitis or could him sleepy tinea. 11/29/15; no major changes in the patient's wounds over her bilateral medial malleolus. There  is no infection here and I don't think any additional antibiotics are necessary. There is now plan to move forward. She sees Dr. Marla Roe in a week's time for preparation for operative debridement and ACEL placement I believe on 7/12. She then has a follow-up appointment with Dr. Marla Roe on 7/21 12/28/15; the patient returns today having been taken to the [facility] by Dr. Marla Roe 12/12/15 she underwent debridement, intraoperative cultures [which were negative]. She had placement of a wound VAC. Parent really ACEL was not available to be placed. The wound VAC foam apparently adhered to the wound since then she's been using silver alginate, Xeroform under Ace wraps. She still says there is a lot of drainage and a lot of pain 01/31/16; this is a patient I see monthly. I had referred her to Dr. Marla Roe him of plastic surgery for large wounds on her bilateral medial ankles. She has been to the OR twice once in early July and once in early August. She tells me over the last 3 weeks she has been using the wound VAC with ACEL underneath it. On the right we've simply been using silver alginate. Under  Kerlix Coban wraps. 02/28/16; this is a patient I'm currently seeing monthly. She is gone on to have a skin graft over her large venous insufficiency ulcer on the left medial ankle. This was done by Dr. Marla Roe him. The patient is a bit perturbed about why she didn't have one on her right medial ankle wound. She has been using silver alginate to this. 03/06/16; I received a phone call from her plastic surgery Dr. Marla Roe. She expressed some concern about the viability of the skin graft she did on the left medial ankle wound. Asked me to place Endoform on this. She told me she is not planning to do a subsequent skin graft on the right as the left one did not take very well. I had placed Hydrofera Blue on the right 03/13/16; continue to have a reasonably healthy wound on the right medial ankle. Down to 3 mm in  terms of size. There is epithelialization here. The area on the left medial ankle is her skin graft site. I suppose the last week this looks somewhat better. She has an open area inferiorly however in the center there appears to be some viable tissue. There is a lot of surface callus and eschar that will eventually need to come off however none of this looked to be infected. Patient states that the is able to keep the dressing on for several days which is an improvement. 03/20/16 no major change in the circumference of either wound however on the left side the patient was at Dr. Eusebio Friendly office and they did a debridement of left wound. 50% of the wound seems to be epithelialized. I been using Endoform on the left Hydrofera Blue in the right 03/27/16; she arrives today with her wound is not looking as healthy as they did last week. The area on the right clearly has an adherent surface to this a very similar surface on the left. Unfortunately for this patient this is all too familiar problem. Clearly the Endoform is not working and will need to change that today that has some potential to help this surface. She does not tolerate debridement in this clinic very well. She is changing the dressing wants 04/03/16; patient arrives with the wounds looking somewhat better especially on the right. Dr. Migdalia Dk change the dressing to silver alginate when she saw her on Monday and also sold her some compression socks. The usefulness of the latter is really not clear and woman with severely draining wounds. 04/10/16; the patient is doing a bit of an experiment wearing the compression stockings that Dr. Migdalia Dk provided her to her left leg and the out of legs based dressings that we provided to the right. 05/01/16; the patient is continuing to wear compression stockings Dr. Migdalia Dk provided her on the left that are apparently silver impregnated. She has been using Iodoflex to the right leg wound. Still a moderate  amount of drainage, when she leaves here the wraps only last for 4 days. She has to change the stocking on the left leg every night 05/15/16; she is now using compression stockings bilaterally provided by Dr. Marla Roe. She is wearing a nonadherent layer over the wounds so really I don't think there is anything specific being done to this now. She has some reduction on the left wound. The right is stable. I think all healing here is being done without a specific dressing 06/09/16; patient arrives here today with not much change in the wound certainly in diameter to large circular wounds over  the medial aspect of her ankle bilaterally. Under the light of these services are certainly not viable for healing. There is no evidence of surrounding infection. She is wearing compression stockings with some sort of silver impregnation as prescribed by Dr. Marla Roe. She has a follow-up with her tomorrow. 06/30/16; no major change in the size or condition of her wounds. These are still probably covered with a nonviable surface. She is using only her purchase stockings. She did see Dr. Marla Roe who seemed to want to apply Dakin's solution to this I'm not extreme short what value this would be. I would suggest Iodoflex which she still has at home. 07/28/16; I follow Mrs. Lightbourn episodically along with Dr. Marla Roe. 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 She has been applying a topical collagen ointment to both wounds with Adaptic. I don't think Dr. Marla Roe is planning to take her back to the OR. 08/19/16; I follow Mrs. Jeneen Rinks on a monthly basis along with Dr. Marla Roe of plastic surgery. 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 I been following her for a number of years. At one point I was able to get the right medial malleolus wound to heal and had the left medial  malleolus down to about half its current size however and I had to send her to plastic surgery for an operative debridement. Since then things have been stable to slightly improve the area on the right is slightly better one in the left about the same although there is much less adherent surface than I'm used to with this patient. She is using some form of liquid collagen gel that Dr. Marla Roe provided a Kerlix cover with the patient's own pressure stockings. She tells me that she has extreme pain in both ankles and along the lateral aspect of both feet. She has been unable to work for some period of time. She is telling me she is retiring at the beginning of April. She sees Dr. Doran Durand of orthopedics next week 09/22/16; patient has not seen Dr. Marla Roe since the last time she is here. I'm not really sure what she is using to the wounds other than bits and pieces of think she had left over including most recently Hydrofera Blue. She is using juxtalite stockings. She is having difficulty with her husband's recent illness "stroke". She is having to transport him to various doctors appointments. Dr. Marla Roe left her the option of a repeat debridement with ACEL however she has not been able to get the time to follow-up on this. She continues to have a fair amount of drainage out of these wounds with certainly precludes leaving dressings on all week 10/13/16; patient has not seen Dr. Marla Roe since she was last in our clinic. I'm not really sure what she is doing with the wounds, we did try to get her [facility] and I think she is actually using this most of the time. Because of drainage she states she has to change this every second day although this is an improvement from what she used to do. She went to see Dr. Doran Durand who did not think she had a muscular issue with regards to her feet, he referred her to a neurologist and I think the appointment is sometime in June. I changed her back to  Iodoflex which she has used in the past but not recently. 11/03/16; the patient has been using Iodoflex  although she ran out of this. Still claims that there is a lot of drainage although the wound does not look like this. No surrounding erythema. She has not been back to see Dr. Marla Roe 11/24/16; the patient has been using Iodoflex again but she ran out of it 2 or 3 days ago. There is no major change in the condition of either one of these wounds in fact they are larger and covered in a thick adherent surface slough/nonviable tissue especially on the left. She does not tolerate mechanical debridement in our clinic. Going back to see Dr. Marla Roe of plastic surgery for an operative debridement would seem reasonable. 12/15/16; the patient has not been back to see Dr. Marla Roe. She is been dealing with a series of illnesses and her husband which of monopolized her time. She is been using Sorbact which we largely supplied. She states the drainage is bad enough that it maximum she can go 2-3 days without changing the dressing 01/12/2017 -- the patient has not been back for about 4 weeks and has not seen Dr. Marla Roe not does she have any appointment pending. 01/23/17; patient has not seen Dr. Marla Roe even though I suggested this previously. She is using Santyl that was suggested last week by Dr. Con Memos this Cost her $16 through her insurance which is indeed surprising 02/12/17; continuing Santyl and the patient is changing this daily. A lot of drainage. She has not been back to see plastic surgery she is using an Ace wrap. Our intake nurse suggested wrap around stockings which would make a good reasonable alternative 02/26/17; patient is been using Santyl and changing this daily due to drainage. She has not been to see plastic surgery she uses in April Ace wrap to control the edema. She did obtain extremitease stockings but stated that the edema in her leg was to big for these 03/20/17; patient is  using Santyl and Anasept. Surfaces looked better today the area on the right is actually measuring a little smaller. She has states she has a lot of pain in her feet and ankles and is asking for a consult to pain control which I'll try to help her with through our case manager. 04/10/17; the patient arrives with better-looking wound surfaces and is slightly smaller wound on the left she is using a combination of Santyl and Anasept. She has an appointment or at least as started in the pain control center associated with [facility] 05/14/17; this is a patient who I followed for a prolonged period of time. She has venous insufficiency ulcers on her bilateral medial ankles. At one point I had this down to a much smaller wound on the left however these reopened and we've never been able to get these to heal. She has been using Santyl and Anasept gel although 2 weeks ago she ran out of the Anasept gel. She has a stable appearance of the wound. She is going to the wound care clinic at [facility]. They wanted do a nerve block/spinal block although she tells me she is reluctant to go forward with that. 05/21/17; this is a patient I have followed for many years. She has venous insufficiency ulcers on her bilateral medial ankles. Chronic pain and deformity in her ankles as well. She is been to see plastic surgery as well as orthopedics. Using PolyMem AG most recently/Kerramax/ABDs and 2 layer compression. She has managed to keep this on and she is coming in for a nurse check to change the dressing on  Tuesdays, we see her on Fridays 06/05/17; really quite a good looking surface and the area especially on the right medial has contracted in terms of dimensions. Well granulated healthy-looking tissue on both sides. Even with an open curet there is nothing that even feels abnormal here. This is as good as I've seen this in quite some time. We have been using PolyMem AG and bringing her in for a nurse  check 06/12/17; really quite good surface on both of these wounds. The right medial has contracted a bit left is not. We've been using PolyMem and AG and she is coming in for a nurse visit 06/19/17; we have been using PolyMem AG and bringing her in for a nurse check. Dimensions of her wounds are not better but the surfaces looked better bilaterally. She complained of bleeding last night and the left wound and increasing pain bilaterally. She states her wound pain is more neuropathic than just the wounds. There was some suggestion that this was radicular from her pain management doctor in talking to her it is really difficult to sort this out. 06/26/17; using PolyMem and AG and bringing her in for a nurse check as All of this and reasonably stable condition. Certainly not improved. The dimensions on the lateral part of the right leg look better but not really measuring better. The medial aspect on the left is about the same. 07/03/16; we have been using PolyMen AG and bringing her in for a nurse check to change the dressings as the wounds have drainage which precludes once weekly changing. We are using all secondary absorptive dressings.our intake nurse is brought up the idea of using a wound VAC/snap VAC on the wound to help with the drainage to see if this would result in some contraction. This is not a bad idea. The area on the right medial is actually looking smaller. Both wounds have a reasonable-looking surface. There is no evidence of cellulitis. The edema is well controlled 07/10/17; the patient was denied for a snap VAC by her insurance. The major issue with these wounds continues to be drainage. We are using wicked PolyMem AG and she is coming in for a nurse visit to change this. The wounds are stable to slightly improved. The surface looks vibrant and the area on the right certainly has shrunk in size but very slowly 07/17/17; the patient still has large wounds on her bilateral medial malleoli.  Surface of both of these wounds looks better. The dimensions seem to come and go but no consistent improvement. There is no epithelialization. We do not have options for advanced treatment products due to insurance issues. They did not approve of the wound VAC to help control the drainage. More recently we've been using PolyMem and AG wicked to allow drainage through. We have been bringing her in for a nurse visit to change this. We do not have a lot of options for wound care products and the home again due to insurance issues 07/24/17; the patient's wound actually looks somewhat better today. No drainage measurements are smaller still healthy-looking surface. We used silver collagen under PolyMen started last week. We have been bringing her in for a dressing change 07/31/17; patient's wound surface continued to look better and I think there is visible change in the dimensions of the wound on the right. Rims of epithelialization. We have been using silver collagen under PolyMen and bringing her in for a dressing change. There appears to be less drainage although she is still  in need of the dressing change 08/07/17. Patient's wound surface continues to look better on both sides and the area on the right is definitely smaller. We have been using silver collagen and PolyMen. She feels that the drainage has been it has been better. I asked her about her vascular status. She went to see Dr. Aleda Grana at [facility] and had some form of ablation. I don't have much detail on this. I haven't my notes from 2016 that she was not a candidate for any further ablation but I don't have any more information on this. We had referred her to vein and vascular I don't think she ever went. He does not have a history of PAD although I don't have any information on this either. We don't even have ABIs in our record 08/14/17; we've been using silver collagen and PolyMen cover. And putting the patient and compression. She we  are bringing her in as a nurse visit to change this because ofarge amount of drainage. We didn't the ABIs in clinic today since they had been done in many moons 1.2 bilaterally. She has been to see vein and vascular however this was at [facility] and she had ablation although I really don't have any information on this all seemed biking get a report. She is also been operatively debrided by plastic surgery and had a cell placed probably 8-12 months ago. This didn't have a major effect. We've been making some gains with current dressings 08/19/17-She is here in follow-up evaluation for bilateral medial malleoli ulcers. She continues to tolerate debridement very poorly. We will continue with recently changed topical treatment; if no significant improvement may consider switching to Iodosorb/Iodoflex. 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 She has been using silver collagen and PolyMem. I believe she has been used and tried on Iodoflex before. 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 These have been very refractory over time. We have been using silver collagen and PolyMen. She is coming in once a week for a doctor's and once a week for nurse visits. We are actually making some progress 09/18/17; the patient's wounds are smaller especially on the right medial. She arrives today to upset to consider even washing these off with Anasept which I think is been part of the reason this is been closing. We've been using collagen covered in PolyMen otherwise. It is noted that she has a small area of folliculitis on the right medial calf that. As we are wrapping her legs I'll give her a short course of doxycycline to make sure this doesn't amount to anything. She is a  long list of complaints today including imbalance, shortness of breath on exertion, inversion of her left ankle. With regards to the latter complaints she is been to see orthopedics and they offered her a tendon release surgery I believe but wanted her wounds to be closed first. I have recommended she go see her primary physician with regards to everything else. 09/25/17; patient's wounds are about the same size. We have made some progress bilaterally although not in recent weeks. She will not allow me T wash these wounds with Anasept even if she is doing her cell. Wheeze we've been using collagen covered in PolyMen. Last week she had a small area of  folliculitis this is now opened into a small wound. She completed 5 days of trimethoprim sulfamethoxazole 10/02/17; unfortunately the area on her left medial ankle is worse with a larger wound area towards the Achilles. The patient complains of a lot of pain. She will not allow debridement although visually I don't think there is anything to debridement in any case. We have been using silver collagen and PolyMen for several months now. Initially we are making some progress although I'm not really seeing that today. We will move back to [facility]. His admittedly this is a bit of a repeat however I'm hoping that his situation is different now. The patient tells me she had her leg on the left give out on her yesterday this is process some pain. 10/09/17; the patient is seen twice a week largely because of drainage issues coming out of the chronic medial bimalleolar wounds that are chronic. Last week the dimensions of the one on the left looks a little larger I changed her to [facility]. She comes in today with a history of terrible pain in the bilateral wound areas. She will not allow debridement. She will not even allow a tissue culture. There is no surrounding erythema no no evidence of cellulitis. We have been putting her Kerlix Coban man. She will  not allow more aggressive compression as there was a suggestion to put her in 3 layer wraps. 10/16/17; large wounds on her bilateral medial malleoli. These are chronic. Not much change from last week. The surface looks have healthy but absolutely no epithelialization. A lot of pain little less so of drainage. She will not allow debridement or even washing these off in the vigorous fashion with Anasept. 10/23/17; large wounds on her bilateral malleoli which are chronic. Some improvement in terms of size perhaps on the right since last time I saw these. She states that after we increased the 3 layer compression there was some bleeding, when she came in for a nurse visit she did not want 3 layer compression put back on about our nurse managed to convince her. She has known chronic venous visit issues and I'm hoping to get her to tolerate the 3 layer compression. using Hydrofera Blue 10/30/17; absolutely no change in the condition of either wound although we've had some improvement in dimensions on the right.. Attempted to put her in 3 layer compression she didn't tolerated she is back in 2 layer compression. We've been using Hydrofera Blue We looked over her past records. She had venous reflux studies in November 2016. There was no evidence of deep venous reflux on the right. Superficial vein did not show the greater saphenous vein at think this is been previously ablated the small saphenous vein was within normal limits. The left deep venous system showed no DVT the vessels were positive for deep venous reflux in the posterior tibial veins at the ankle. The greater saphenous vein was surgically absent small saphenous vein was within normal limits. She went to vein and vascular at [facility]. I believe she had an ablation on the left greater saphenous vein. I'll update her reflux studies perhaps ever reviewed by vein and vascular. We've made absolutely no progress in these wounds. Will also try to read  and TheraSkins through her insurance 11/06/17; W the patient apparently has a 2 week follow-up with vein and vascular I like him to review the whole issue with regards to her previous vascular workup by Dr. Aleda Grana. We've really made no progress on  these wounds in many months. She arrives today with less viable looking surface on the left medial ankle wound. This was apparently looking about the same on Tuesday when she was here for nurse visit. 11/13/17; deep tissue culture I did last time of the left lower leg showed multiple organisms without any predominating. In particular no Staphylococcus or group A strep were isolated. We sent her for venous reflux studies. She's had a previous left greater saphenous vein stripping and I think sclerotherapy of the right greater saphenous vein. She didn't really look at the lesser saphenous vein this both wounds are on the medial aspect. She has reflux in the common femoral vein and popliteal vein and an accessory vein on the right and the common femoral vein and popliteal vein on the left. I'm going to have her go to see vein and vascular just the look over things and see if anything else beside aggressive compression is indicated here. We have not been able to make any progress on these wounds in spite of the fact that the surface of the wounds is never look too bad. 11/20/17; no major change in the condition of the wounds. Patient reports a large amount of drainage. She has a lot of complaints of pain although enlisting her today I wonder if some of this at least his neuropathic rather than secondary to her wounds. She has an appointment with vein and vascular on 12/30/17. The refractory nature of these wounds in my mind at least need vein and vascular to look over the wounds the recent reflux studies we did and her history to see if anything further can be done here. I also note her gait is deteriorated quite a bit. Looks like she has inversion of her foot  on the right. She has a bilateral Trendelenburg gait. I wonder if this is neuropathic or perhaps multilevel radicular. 11/27/17; her wounds actually looks slightly better. Healthy-looking granulation tissue a scant amount of epithelialization. [place] healthcare will not pay for [employer]. They will play for tri layer Oasis and Dermagraft. This is not a diabetic ulcer. We'll try for the tri layer Oasis. She still complains of some drainage. She has a vein and vascular appointment on 12/30/17 12/04/17; the wounds visually look quite good. Healthy-looking granulation with some degree of epithelialization. We are still waiting for response to our request for trial to try layer Oasis. Her appointment with vascular to review venous and arterial issues isn't sold the end of July 7/31. Not allow debridement or even vigorous cleansing of the wound surface. 12/18/17; slightly smaller especially on the right. 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 We've been using Hydrofera Blue. We still are looking into triple layer Oasis through her insurance 01/08/18 on evaluation today patient's wound actually appears to be showing signs of good improvement at this point in time. She has been tolerating the dressing changes without complication. Fortunately there does not appear to be any evidence of infection at this point in time. We have been utilizing silver nitrate which does seem to be of benefit for her which is also good news. Overall I'm very happy with how things seem to be both regards appearance as well as measurement. Patient did see Dr. Bridgett Larsson for evaluation on 12/30/17. In his assessment he felt that stripping would not likely add much more than chronic compression to the patient's healing process. His recommendation was to follow-up in three months with Dr. Doren Custard if she hasn't healed in order to consider  referral back to you and see vascular where she previously was in a trial and was  able to get her wound to heal. I'll be see what she feels she when you staying compression and he reiterated this as well. 01/13/18 on evaluation today patient appears to actually be doing very well in regard to her bilateral medial malleolus ulcers. She seems to have tolerated the chemical cauterization with silver nitrate last week she did have some pain through that evening but fortunately states that I'll be see since it seems to be doing better she is overall pleased with the progress. 01/21/18; really quite a remarkable improvement since I've last seen these wounds. We started using silver nitrate specially on the islands of hyper granulation which for some reason her around the wound circumference. This is really done quite nicely. Primary dressing Hydrofera Blue under 4 layer compression. She seems to be able to hold out without a nurse rewrap. Follow-up in 1 week 01/28/18; we've continued the hydrofera blue but continued with chemical cauterization to the wound area that we started about a month ago for irregular hyper granulation. She is made almost stunning improvement in the overall wound dimensions. I was not really expecting this degree of improvement in these chronic wounds 02/05/18; we continue with Hydrofera Bluebut of also continued the aggressive chemical cauterization with silver nitrate. We made nice progress with the right greater than left wound. 02/12/18. We continued with Hydrofera Blue after aggressive chemical cauterization with silver nitrate. We appear to be making nice progress with both wound areas 02/19/2018; we continue with [facility] after washing the wounds vigorously with Anasept spray and chemical cauterization with silver nitrate. We are making excellent progress. The area on the right's just about closed 02/26/2018. The area on the left medial ankle had too much necrotic debris today. I used a #5 curette we are able to get most of the soft. I continued with  the silver nitrate to the much smaller wound on the right medial ankle she had a new area on her right lower pretibial area which she says was due to a role in her compression 03/05/2018; both wound areas look healthy. Not much change in dimensions from last week. I continue to use silver nitrate and Hydrofera Blue. The patient saw Dr. Doren Custard of vein and vascular. He felt she had venous stasis ulcers. He felt based on her previous arteriogram she should have adequate circulation for healing. Also she has deep venous reflux but really no significant correctable superficial venous reflux at this time. He felt we should continue with conservative management including leg elevation and compression 04/02/2018; since we last saw this woman about a month ago she had a fall apparently suffered a pelvic fracture. I did not look up the x-ray. Nevertheless because of pain she literally was bedbound for 2 weeks and had home health coming out to change the dressing. Somewhat predictably this is resulted in considerable improvement in both wound areas. The right is just about closed on the medial malleolus and the left is about half the size. 04/16/2018; both her wounds continue to go down in size. Using Hydrofera Blue. 05/07/18; both her wounds appeared to be improving especially on the right where it is almost closed. We are using Hydrofera Blue 05/14/2018; slightly worse this week with larger wounds. Surface on the left medial not quite as good. We have been using Hydrofera Blue 05/21/18; again the wounds are slightly larger. Left medial malleolus slightly larger with eschar  around the circumference. We have been using Hydrofera Blue undergoing a wraps for a prolonged period of time. This got a lot better when she was more recumbent due to a fall and a back injury. I change the primary dressing the silver alginate today. She did not tolerate a 4 layer compression previously although I may need to bring this up with  her next time 05/28/2018; area on the left medial malleolus again is slightly larger with more drainage. Area on the right is roughly unchanged. She has a small area of folliculitis on the right medial just on the lower calf. This does not look ominous. 06/03/2018 left medial malleolus slightly smaller in a better looking surface. We used silver nitrate on this last time with silver alginate. The area on the right appears slightly smaller 1/10; left medial malleolus slightly smaller. Small open area on the right. We used silver nitrate and silver alginate as of 2 weeks ago. We continue with the wound and compression. These got a lot better when she was off her feet 1/17; right medial malleolus wound is smaller. The left may be slightly smaller. Both surfaces look somewhat better. 1/24; both wounds are slightly smaller. Using silver alginate under Unna boots 1/31; both wounds appear smaller in fact the area on the right medial is just about closed. Surface eschar. We have been using silver alginate under Unna boots. The patient is less active now spends let much less time on her feet and I think this is contributed to the general improvement in the wound condition 2/7; both wounds appear smaller. I was hopeful the right medial would be closed however there there is still the same small open area. Slight amount of surface eschar on the left the dimensions are smaller there is eschar but the wound edges appear to be free. We have been using silver alginate under Unna boot's 2/14; both wounds once again measure smaller. Circumferential eschar on the left medial. We have been using silver alginate under Unna boots with gradual improvement 2/21; the area on the right medial malleolus has healed. The area on the left is smaller. We have been using silver alginate and Unna boots. We can discharge wrapping the right leg she has 20/30 stockings at home she will need to protect the scar tissue in this area 2/28;  the area on the right medial malleolus remains closed the patient has a compression stocking. The area on the left is smaller. We have been using silver alginate and Unna boots. 3/6 the area on the right medial ankle remains closed. Good edema control noted she is using her own compression stocking. The area on the left medial ankle is smaller. We have been managing this with silver alginate and Unna boots which we will continue today. 3/13; the area on the right medial ankle remains closed and I'm declaring it healed today. When necessary the left is about the same still a healthy-looking surface but no major change and wound area. No evidence of infection and using silver alginate under unna and generally making considerable improvement 3/27 the area on the right medial ankle remains closed the area on the left is about the same as last week. Certainly not any worse we have been using silver alginate under an Unna boot 4/3; the area on the right medial ankle remains closed per the patient. We did not look at this wound. The wound on the left medial ankle is about the same surface looks healthy we have been  using silver alginate under an Unna boot 4/10; area on the right medial ankle remains closed per the patient. We did not look at this wound. The wound on the left medial ankle is slightly larger. The patient complains that the [facility] caused burning pain all week. She also told us that she was a lot more active this week. Changed her back to silver alginate 4/17; right medial ankle still closed per the patient. Left medial ankle is slightly larger. Using silver alginate. She did not tolerate Hydrofera Blue on this area 4/24; right medial ankle remains closed we have not look at this. The left medial ankle continues to get larger today by about a centimeter. We have been using silver alginate under Unna boots. She complains about 4 layer compression as an alternative. She has been up on her  feet working on her garden 5/8; right medial ankle remains closed we did not look at this. The left medial ankle has increased in size about 100%. We have been using silver alginate under Unna boots. She noted increased pain this week and was not surprised that the wound is deteriorated 5/15; no major change in SA however much less erythema ( one week of doxy ocellulitis). 5/22-65 year old female returns at 1 week to the clinic for left medial ankle wound for which we have been using silver alginate under 3 layer compression She was placed on DOXY at last visit - the wound is wider at this visit. She is in 3 layer compression 5/29; change to [facility] last week. I had given her empiric doxycycline 2 weeks ago for a week. She is in 3 layer compression. She complains of a lot of pain and drainage on presentation today. 6/5; using Hydrofera Blue. I gave her doxycycline recently empirically for erythema and pain around the wound. Believe her cultures showed enterococcus which not would not have been well covered by doxycycline nevertheless the wound looks better and I don't feel specifically that the enterococcus needs to be covered. She has a new what looks like a wrap injury on her lateral left ankle. 6/12; she is using Hydrofera Blue. She has a new area on the left anterior lower tibial area. This was a wrap injury last week. 6/19; the patient is using Hydrofera Blue. She arrived with marked inflammation and erythema around the wound and tenderness. 12/01/18 on evaluation today patient appears to be doing a little bit better based on what I'm hearing from the standpoint of lassos evaluation to this as far as the overall appearance of the wound is concerned. Then sometime substandard she typically sees Dr. Dellia Nims. Nonetheless overall very pleased with the progress that she's made up to this point. No fevers, chills, nausea, or vomiting noted at this time. 7/10; some improvement in the surface area.  Aggressively debrided last week apparently. I went ahead with the debridement today although the patient does not tolerate this very well. We have been using Iodoflex. Still a fair amount of drainage 7/17; slightly smaller. Using Iodoflex. 7/24; no change from last week in terms of surface area. We have been using Iodoflex. Surface looks and continues to look somewhat better 7/31; surface area slightly smaller better looking surface. We have been using Iodoflex. This is under Unna boot compression 8/7-Patient presents at 1 week with Unna boot and Iodoflex, wound appears better 8/14-Patient presents at 1 week with Iodoflex, we use the Unna boot, wound appears to be stable better.Patient is getting Botox treatment for the inversion of  the foot for tendon release, Next week 8/21; we are using Iodoflex. Unna boot. The wound is stable in terms of surface area. Under illumination there is some areas of the wound that appear to be either epithelialized or perhaps this is adherent slough at this point I was not really clear. It did not wipe off and I was reluctant to debride this today. 8/28; we are using Iodoflex in an Unna boot. Seems to be making good improvement. 9/4; using Iodoflex and wound is slightly smaller. 9/18; we are using Iodoflex with topical silver nitrate when she is here. The wound continues to be smaller 10/2; patient missed her appointment last week due to GI issues. She left and Iodoflex based dressing on for 2 weeks. Wound is about the same size about the size of a dime on the left medial lower 10/9 we have been using Iodoflex on the medial left ankle wound. She has a new superficial probable wrap injury on the dorsal left ankle 10/16; we have been using Hydrofera Blue since last week. This is on the left medial ankle 10/23; we have been using Hydrofera Blue since 2 weeks ago. This is on the left medial ankle. Dimensions are better 11/6; using Hydrofera Blue. I think the wound is  smaller but still not closed. Left medial ankle 11/13; we have been using Hydrofera Blue. Wound is certainly no smaller this week. Also the surface not as good. This is the remanent of a very large area on her left medial ankle. 11/20; using Sorbact since last week. Wound was about the same in terms of size although I was disappointed about the surface debris 12/11; 3-week follow-up. Patient was on vacation. Wound is measuring slightly larger we have been using Sorbact. 12/18; wound is about the same size however surface looks better last week after debridement. We have been using Sorbact under compression 1/15 wound is probably twice the size of last time increased in length nonviable surface. We have been using Sorbact. She was running a mild fever and missed her appointment last week 1/22; the wound is come down in size but under illumination still a very adherent debris we have been Hydrofera Blue that I changed her to last week 1/29; dimensions down slightly. We have been using Hydrofera Blue 2/19 dimensions are the same however there is rims of epithelialization under illumination. Therefore more the surface area may be epithelialized 2/26; the patient's wound actually measures smaller. The wound looks healthy. We have been using Hydrofera Blue. I had some thoughts about running Apligraf then I still may do that however this looks so much better this week we will delay that for now 3/5; the wound is small but about the same as last week. We have been using Hydrofera Blue. No debridement is required today. 3/19; the wound is about the size of a dime. Healthy looking wound even under illumination. We have been using Hydrofera Blue. No mechanical debridement is necessary 3/26; not much change from last week although still looks very healthy. We have been using Hydrofera Blue under Unna boots Patient was offered an ankle fusion by podiatry but not until the wound heals with a proceed with  this. 4/9; the patient comes in today with her original wound on the medial ankle looking satisfactory however she has some uncontrolled swelling in the middle part of her leg with 2 new open areas superiorly just lateral to the tibia. I think this was probably a wrap issue. She said she felt  uncomfortable during the week but did not call in. We have been using Hydrofera Blue 4/16; the wound on the medial ankle is about the same. She has innumerable small areas superior to this across her mid tibia. I think this is probably folliculitis. She is also been working in the yard doing a lot of sweating 4/30; the patient issue on the upper areas across her mid tibia of all healed. I think this was excessive yard work if I remember. Her wound on the medial ankle is smaller. Some debris on this we have been using Hydrofera Blue under Unna boots 5/7; mid tibia. She has been using Hydrofera Blue under an Unna wrap. She is apparently going for her ankle surgery on June 3 10/28/19-Patient returns to clinic with the ankle wound, we are using Hydrofera Blue under Unna wrap, surgery is scheduled for her left foot for June 3 so she will be back for nurse visit next week READMISSION 01/17/2020 [name] is a 65 year old woman we have had in this clinic for a long period of time with severe venous hypertension and refractory wounds on her medial lower legs and ankles bilaterally. 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 When she was off her legs for a prolonged period example when she fell and suffered a compression fracture things would heal up quite nicely. She is now retired and we managed to heal up the right medial leg wound. The left one was very tiny last time I saw this although still refractory. 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 It got to the point where this was interfering with her walking and she elected to proceed with a ankle arthrodesis to straighten her her ankle and leave her with a functional outcome for mobilization. The patient was referred to Dr. Doren Custard and really this took some time to arrange. Dr. Doren Custard saw her on 12/07/2019. Once again he verified that she had no arterial issues. She had previously had an angiogram several years ago. Follow-up ABIs on the left showed an ABI of 1.12 with triphasic waveforms and a TBI of 0.92. She is felt to have chronic deep venous insufficiency but I do not think it was felt that anything could be done from about this from an ablation point of view. At the time Dr. Doren Custard saw this patient the wounds actually look closed via the pictures in his clinic. The patient finally underwent her surgery on 12/15/2019. This went reasonably well and there was a good anatomic outcome. She developed a small distal wound dehiscence on the lateral part of the surgical wound. However more problematically she is developed recurrence of the wound on the medial left ankle. There are actually 2 wounds here one in the distal lower leg and 1 pretty much at the level of the medial malleolus. It is a more distal area that is more problematic. She has been using Hydrofera Blue which started on Friday before this she was simply Ace wrapping. There was a culture done that showed Pseudomonas and she is on ciprofloxacin. A recent CNS on 8/11 was negative. The patient reports some pain but I generally think this is improving. She is using a cam boot completely nonweightbearing using a walker for pivot transfers and a wheelchair 8/24; not much improvement  unfortunately she has a surgical wound on the lateral part in the venous insufficiency wound medially. The bottom part of the medial insufficiency wound is still necrotic there is exposed tendon here. We have been using  Hydrofera Blue under compression. Her edema control is however better 8/31; patient in for follow-up of his surgical wound on the lateral part of her left leg and chronic venous insufficiency ulcers medially. We put her back in compression last week. She comes in today with a complaint of 3 or 4 days worth of increasing pain. She felt her cam walker was rubbing on the area on the back of her heel. However there is intense erythema seems more likely she has cellulitis. She had 2 cultures done when she was seeing podiatry in the postop. One of them in late July showed Pseudomonas and she received a course of ciprofloxacin the other was negative on 8/11 she is allergic to penicillin with anaphylactoid complaints of hives oral swelling via information in epic 9/9; when I saw this patient last week she had intense anterior erythema around her wound on the right lateral heel and ankle and also into the right medial heel. Some of this was no doubt drainage and her walker boot however I was convinced she had cellulitis. I gave her Levaquin and Bactrim she is finishing up on this now. She is following up with Dr. [name] he saw her yesterday. He is taken her out of the walking boot of course she is still nonweightbearing. Her x-ray was negative for any worrisome features such as soft tissue air etc. Things are a lot better this week. She has home health. We have been using Hydrofera Blue under an [employer] which she put back on yesterday. I did not wrap her last week 9/17; her surrounding skin looks a lot better. In fact the area on the left lateral ankle has just a scant amount of eschar. The only remaining wound is the large area on the left medial ankle. Probably about 60% of this is healthy granulation at the surface however she has a significant divot distally. This has adherent debris in it. I been using debridement and silver collagen to try and get this area to fill-in although I do not think we have made  much progress this week 9/24; the patient's wound on the left medial ankle looks a lot better. The deeper divot area distally still requires debridement but this is cleaning up quite nicely we have been using silver collagen. The patient is complaining of swelling in her foot and is worried that that is contributing to the nonhealing of the ankle wound. She is also complaining of numbness in her anterior toes 10/4; left medial ankle. The small area distally still has a divot with necrotic material that I have been debriding away. This has an undermining area. She is approved for Apligraf. She saw Dr. [name] her surgeon on 10/1. I think he declared himself is satisfied with the condition of things. Still nonweightbearing till the end of the month. 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 Her surgical wound is well closed. There is no evidence of infection 10/11; the patient arrived in clinic today with the expectation that we be able to put an Apligraf on this area after debridement however she arrives with a relatively large amount of green drainage on the dressing. The patient states that this started on Friday. She has not been systemically unwell. 10/19; culture I did  last week showed both Enterococcus and Pseudomonas. I think this came in separate parts because I stopped her ciprofloxacin I gave her and prescribed her linezolid however now looking at the final culture result this is Pseudomonas which is resistant to quinolones. She has not yet picked up the linezolid apparently phone issues. We are also trying to get a topical antibiotic out of [place] in [place] they can be applied by home health. She is still having green drainage 10/16; the patient has her topical antibiotic from [facility] in [place]. This is a compounded gel with vancomycin and ciprofloxacin and gentamicin. We are applying this on the wound bed with silver alginate over the top  with Unna boot wraps. She arrives in clinic today with a lot less ominous looking drainage although she is only use this topical preparation once the second time today. She sees Dr. [name] her surgeon on Friday she has home health changing the dressing 11/2; still using her compounded topical antibiotic under silver alginate. Surface is cleaning up there is less drainage. We had an Apligraf for her today and I elected to apply it. A light coating of her antibiotic 04/25/2020 upon evaluation today patient appears to be doing well with regard to her ankle ulcer. There is a little bit of slough noted on the surface of the wound I am can have to perform sharp debridement to clear this away today. With that being said other than that fact overall I feel like she is making progress and we do see some new epithelial growth. There is also some improvement in the depth of the wound and that distal portion. There is little bit of slough there as well. 12/7; 2-week follow-up. Apligraf #3. Dimensions are smaller. Closing in especially inferiorly. Still some surface debris. Still using the [facility] topical antibiotic but I told her that I don't think this needs to be renewed 12/21; 2-week follow-up. Apligraf #4 dimensions are smaller. Nice improvement 06/05/2020; 2-week follow-up. The patient's wound on the left medial ankle looks really excellent. Nice granulation. Advancing epithelialization no undermining no evidence of infection. We would have to reapply for another Apligraf but with the condition of this wound I did not feel strongly about it. We used Hydrofera Blue under the same degree of compression. She follows up with Dr. [name] her surgeon a week Friday 06/13/2020 upon evaluation today patient appears to be doing excellent in regard to her wound. She has been tolerating the dressing changes without complication. Fortunately there is no signs of active infection at this time. No fevers, chills, nausea,  vomiting, or diarrhea. She was using Hydrofera Blue last week. 06/20/2020 06/20/2020 on evaluation today patient actually appears to be doing excellent in regard to her wound. This is measuring better and looking much better as well. She has been using the collagen that seems to be doing better for her as well even though the [facility] was and is not sticking or feeling as rough on her wound. She did see Dr. [name] on Friday he is very pleased he also stated none of the hardware has shifted. That is great news 1/27; the patient has a small clean wound all that is remaining. I agree that this is too small to really consider an Apligraf. Under illumination the surface is looking quite good. We have been using collagen although the dimensions are not any better this week 2/2; the patient has a small clean wound on the left medial ankle. Although this left of  her substantial original areas. Measurements are smaller. We have been using polymen Ag under an Haematologist. 2/10; small area on the left medial ankle. This looks clean nothing to debride however dimensions are about the same we have been using polymen I think now for 2 weeks 2/17; not much change in surface area. We have been using polymen Ag without any improvement. 3/17; 1 month follow-up. The patient has been using endoform without any improvement in fact I think this looks worse with more depth and more expansion 3/24; no improvement. Perhaps less debris on the surface. We have been using Sorbact for 1 week 4/4; wound measures larger. She has edema in her leg and her foot which she tells as her wrap came down. We have been using Unna boots. Sorbact of the wound. She has been approved for Apligraf 09/12/2020 upon evaluation today patient appears to be doing well with regard to her wound. We did get the Apligraf reapproved which is great news we have that available for application today. Fortunately there is no signs of infection and overall the  patient seems to be doing great. The wound bed is nice and clean. 4/27; patient presents for her second application of Apligraf. She states over the past week she has been on her feet more often due to being outside in her garden. She has noted more swelling to her foot as a result. She denies increased warmth, pain or erythema to the wound site. 10/10/2020 upon evaluation today patient appears to be doing well with regard to her wound which does not appear to be quite as irritated as last week from what I am hearing. With that being said unfortunately she is having issues with some erythema and warmth to touch as well as an increase size. I do believe this likely is infected. 10/17/2020 upon evaluation today patient appears to be doing excellent in regard to her wound this is significantly improved as compared to last week. Fortunately I think that the infection is much better controlled at this point. She did have evidence of both Enterococcus as well as Staphylococcus noted on culture. Enterococcus really would not be helped significantly by the Cipro but the wound is doing so much better I am under the assumption that the Staphylococcus is probably the main organism that is causing the current infection. Nonetheless I think that she is doing excellent as far as that is concerned and I am very pleased in that regard. I would therefore recommend she continue with the Cipro. 10/31/2020 upon evaluation today patient appears to be doing well with regard to her wound. She has been tolerating the dressing changes without complication. Fortunately there is no signs of active infection and overall I am extremely pleased with where things stand today. No fevers, chills, nausea, vomiting, or diarrhea. With that being said she does have some green drainage coming from the wound and although it looks okay I am a little concerned about the possibility of a continuing infection. Specifically with Pseudomonas. For  that reason I will go ahead and send in a prescription for Cipro for her to be continued. 11/14/2020 upon evaluation today patient appears to be doing very well currently in regard to her wound on her leg. She has been tolerating the dressing changes without complication. Fortunately I feel like the infection is finally under good control here. Unfortunately we do not have the Apligraf for application today although we can definitely order to have it in place for next week.  That will be her fifth and final of the current series. Nonetheless I feel like her wound is really doing quite well which is great news. 11/21/2020 upon evaluation today patient appears to be doing well with regard to her wound on the medial ankle. Fortunately I think the infection is under control and I do believe we can go ahead and reapply the Apligraf today. She is in agreement with that plan. There does not appear to be any signs of active infection at this time which is great news. No fevers, chills, nausea, vomiting, or diarrhea. 12/05/2020 upon evaluation today patient's wound bed actually showed signs of good granulation epithelization at this point. There does not appear to be any signs of infection which is great news and overall very pleased with where things stand. Overall the patient seems to be doing fairly well in my opinion with regard to her wound although I do believe she continues to build up a lot of biofilm I think she could benefit from using PuraPly at this point. 12/12/2020 upon evaluation today patient's wound actually appears to be doing decently well today. The Unna boot has not been quite as well-tolerated so that more uncomfortable for her and even causing some pressure over the plate on the lateral portion of her foot which is 90 where the wound is. There did not appear to be any significant deep tissue injury with that there may be a minimal change in the skin noted I think that we may want to go back to  the Coflex 2 layer which is a little bit easier on her skin it seems. 12/19/2020 upon evaluation today patient actually seems to be making great progress with the PuraPly currently. She in fact seems to be much better as far as the overall appearance of the wound bed is concerned I am very happy in this regard. I do not see any signs of of infection which is great news as well. No fevers, chills, nausea, vomiting, or diarrhea. 12/26/2020 upon evaluation today patient appears to actually be doing better in regard to her wound on the left medial ankle region. The surface of the wound is actually doing significantly better which is great news. There does not appear to be any signs of infection which is also great news and in general I am extremely pleased with where we stand today. 01/02/2021 upon evaluation today patient appears to be doing well with regard to her wound. In fact this is showing signs of excellent improvement and very pleased with where things stand. In fact the last 3 appointments have all shown signs of this getting smaller which is excellent news. I have not even had to perform any debridement and today is no exception. Overall I feel like this is dramatically improved compared to previous. T oday is PuraPly application #4. [id number] upon evaluation today patient appears to be doing excellent in regard to her wound this is continue to show signs of improvement and overall I am extremely pleased with where we stand today. She is actually here for PuraPly application #5. Every time we have applied this she is noted definite improvement on measurements. Objective Constitutional Well-nourished and well-hydrated in no acute distress. Vitals Time Taken: 10:56 AM, Height: 68 in, Temperature: 98.2 F, Pulse: 60 bpm, Respiratory Rate: 18 breaths/min, Blood Pressure: 130/72 mmHg. Respiratory normal breathing without difficulty. Psychiatric this patient is able to make decisions and  demonstrates good insight into disease process. Alert and Oriented x 3. pleasant and  cooperative. General Notes: Upon inspection patient's wound bed showed some signs of good healing and overall I am extremely pleased with where we stand there does not appear to be any evidence of active infection which is great news. No fevers, chills, nausea, vomiting, or diarrhea. Integumentary (Hair, Skin) Wound #15 status is Open. Original cause of wound was Gradually Appeared. The date acquired was: 12/30/2019. The wound has been in treatment 51 weeks. The wound is located on the Left,Medial Malleolus. The wound measures 3.1cm length x 2.2cm width x 0.1cm depth; 5.356cm^2 area and 0.536cm^3 volume. There is Fat Layer (Subcutaneous Tissue) exposed. There is no tunneling or undermining noted. There is a medium amount of serosanguineous drainage noted. The wound margin is distinct with the outline attached to the wound base. There is large (67-100%) red, pink granulation within the wound bed. There is a small (1-33%) amount of necrotic tissue within the wound bed including Adherent Slough. 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 A skin graft procedure using a bioengineered skin substitute/cellular or tissue based product was performed by [name], PA with the following instrument(s): Scissors. Puraply AM was applied and was not secured. 12 sq cm of product was utilized and 0 sq cm was wasted. Post Application, adaptic was applied. A Time Out was conducted at 11:45, prior to the start of the procedure. The procedure was tolerated well with a pain  level of 0 throughout and a pain level of 0 following the procedure. Post procedure Diagnosis Wound #15: Same as Pre-Procedure . Pre-procedure diagnosis of Wound #15 is a Venous Leg Ulcer located on the Left,Medial Malleolus . There was a Double Layer Compression Therapy Procedure by Deon Pilling, RN. Post procedure Diagnosis Wound #15: Same as Pre-Procedure Plan Follow-up Appointments: Return Appointment in 2 weeks. - Wed with Margarita Grizzle - puraply Nurse Visit: - Monday 8/15 - Cellular or Tissue Based Products: Cellular or Tissue Based Product Type: - PuraPly: #5 Cellular or Tissue Based Product applied to wound bed, secured with steri-strips, cover with Adaptic or Mepitel. (DO NOT REMOVE). - no steristrips Bathing/ Shower/ Hygiene: May shower with protection but do not get wound dressing(s) wet. Edema Control - Lymphedema / SCD / Other: Elevate legs to the level of the heart or above for 30 minutes daily and/or when sitting, a frequency of: - throughout the day Avoid standing for long periods of time. Exercise regularly Compression stocking or Garment 20-30 mm/Hg pressure to: - right leg daily Additional Orders / Instructions: Follow Nutritious Diet Non Wound Condition: Apply the following to affected area as directed: - ketoconozole mixed with TCA cream to rash on right calf WOUND #15: - Malleolus Wound Laterality: Left, Medial Cleanser: Soap and Water 1 x Per Week/30 Days Discharge Instructions: May shower and wash wound with dial antibacterial soap and water prior to dressing change. Peri-Wound Care: Triamcinolone 15 (g) 1 x Per Week/30 Days Discharge Instructions: Use triamcinolone 15 (g) mixed with lotion Peri-Wound Care: Sween Lotion (Moisturizing lotion) 1 x Per Week/30 Days Discharge Instructions: Apply moisturizing lotion as directed Prim Dressing: Endoform 2x2 in 1 x Per Week/30 Days ary Discharge Instructions: Moisten with saline, use at nurse visit on 8/15 Prim Dressing:  PuraPly AM 1 x Per Week/30 Days ary Secondary Dressing: Woven Gauze Sponge, Non-Sterile 4x4 in 1 x Per Week/30 Days Discharge Instructions: Apply over primary dressing as directed. Secondary Dressing: ABD Pad, 5x9 1 x Per Week/30 Days Discharge Instructions: Apply over primary dressing and lateral ankle Secondary Dressing: Optifoam Non-Adhesive Dressing, 4x4 in 1 x Per Week/30 Days Discharge Instructions: Apply over primary dressing pad lateral foot with foam donut Secondary Dressing: ADAPTIC TOUCH 3x4.25 in 1 x Per Week/30 Days Discharge Instructions: Apply over skin sub Com pression Wrap: CoFlex TLC XL 2-layer Compression System 4x7 (in/yd) 1 x Per Week/30 Days Discharge Instructions: Apply CoFlex 2-layer compression as directed. (alt for 4 layer) 1. I would recommend currently that we going continue with the wound care measures as before and the patient is in agreement with plan this includes the use of the PuraPly which actually did apply today as well. No sharp debridement was necessary and overall the patient tolerated this without complication I think that we will continue as such with the plans as before for next week. 2. I am also can recommend currently that we continue specifically with a Coflex 2 layer compression wrap which seems to be doing a good job with padding as well over the areas of the hardware protrusion on the lateral portion of the foot which is helping her quite tremendously. We will see patient back for reevaluation in 1 week here in the clinic. If anything worsens or changes patient will contact our office for additional recommendations. Electronic Signature(s) Signed: 01/10/2021 5:44:46 PM By: [name] PA-C Entered By: [name] on 01/10/2021 17:44:46 -------------------------------------------------------------------------------- SuperBill Details Patient Name: Date of Service: [facility] [name], [name] 01/09/2021 Medical Record Number: [id number] Patient  Account Number: [id number] Date of Birth/Sex: Treating RN: 03/16/1956 (65 y.o. [name] Primary Care Provider: [name] Other Clinician: Referring Provider: Treating Provider/Extender: [name] in Treatment: 36 Diagnosis Coding ICD-10 Codes Code Description [phone] Chronic venous hypertension (idiopathic) with ulcer and inflammation of left lower extremity L97.828 Non-pressure chronic ulcer of other part of left lower leg with other specified severity L97.328 Non-pressure chronic ulcer of left ankle with other specified severity Facility Procedures CPT4 Code: [id number] 1 I Description: [id number] PuraPly AM 3X4 (12sq. cm) 5271 - SKIN SUB GRAFT TRNK/ARM/LEG CD-10 Diagnosis Description L97.828 Non-pressure chronic ulcer of other part of left lower leg with other specified sever Modifier: 1 ity Quantity: 12 Physician Procedures : CPT4 Code Description Modifier [id number] - WC PHYS SKIN SUB GRAFT TRNK/ARM/LEG ICD-10 Diagnosis Description L97.828 Non-pressure chronic ulcer of other part of left lower leg with other specified severity Quantity: 1 Electronic Signature(s) Signed: 01/10/2021 5:45:03 PM By: [name] PA-C Previous Signature: 01/09/2021 3:55:35 PM Version By: Baruch Gouty RN, BSN Entered By: [name] on 01/10/2021 17:45:03

## 2021-01-14 ENCOUNTER — Encounter (HOSPITAL_BASED_OUTPATIENT_CLINIC_OR_DEPARTMENT_OTHER): Payer: Medicare Other | Admitting: Internal Medicine

## 2021-01-14 ENCOUNTER — Other Ambulatory Visit: Payer: Self-pay

## 2021-01-14 DIAGNOSIS — I87332 Chronic venous hypertension (idiopathic) with ulcer and inflammation of left lower extremity: Secondary | ICD-10-CM | POA: Diagnosis not present

## 2021-01-15 NOTE — Progress Notes (Signed)
[name], [name] ([id number]) Visit Report for 01/14/2021 Arrival Information Details Patient Name: Date of Service: [place] [name], [name] 01/14/2021 7:30 A [name] Medical Record Number: [id number] Patient Account Number: [id number] Date of Birth/Sex: Treating RN: 1955-09-22 (65 y.o. [name] Primary Care [name]: [name] Other Clinician: Referring [name]: Treating [name]/Extender: [name] in Treatment: 9 Visit Information History Since Last Visit Added or deleted any medications: No Patient Arrived: Ambulatory Any new allergies or adverse reactions: No Arrival Time: 07:46 Had a fall or experienced change in No Accompanied By: self activities of daily living that may affect Transfer Assistance: None risk of falls: Patient Identification Verified: Yes Signs or symptoms of abuse/neglect since last visito No Secondary Verification Process Completed: Yes Hospitalized since last visit: No Patient Requires Transmission-Based Precautions: No Implantable device outside of the clinic excluding No Patient Has Alerts: Yes cellular tissue based products placed in the center Patient Alerts: L ABI =1.12, TBI = .92 since last visit: R ABI= 1.02, TBI= .58 Has Dressing in Place as Prescribed: Yes Pain Present Now: Yes Electronic Signature(s) [name]: 01/15/2021 11:32:43 AM By: [name] Entered By: [name] on 01/14/2021 07:46:38 -------------------------------------------------------------------------------- Compression Therapy Details Patient Name: Date of Service: [name], [name] 01/14/2021 7:30 A [name] Medical Record Number: [id number] Patient Account Number: [id number] Date of Birth/Sex: Treating RN: 22-Aug-1955 (65 y.o. [name] Primary Care Kambra Beachem: [name] Other Clinician: Referring [name]: Treating [name]/Extender: [name] in Treatment: 88 Compression Therapy Performed for  Wound Assessment: Wound #15 Left,Medial Malleolus Performed By: Clinician [name] Pilling, RN Compression Type: Double Layer Electronic Signature(s) [name]: 01/14/2021 5:38:02 PM By: [name] Entered By: [name] on 01/14/2021 08:17:36 -------------------------------------------------------------------------------- Encounter Discharge Information Details Patient Name: Date of Service: [facility] [name], [name] 01/14/2021 7:30 A [name] Medical Record Number: [id number] Patient Account Number: [id number] Date of Birth/Sex: Treating RN: [birth year] (65 y.o. [name] Primary Care Creedon Danielski: Other Clinician: Lennie [name] Referring [name]: Treating Sharonda Llamas/Extender: [name] in Treatment: 64 Encounter Discharge Information Items Discharge Condition: Stable Ambulatory Status: Cane Discharge Destination: Home Transportation: Private Auto Accompanied By: self Schedule Follow-up Appointment: Yes Clinical Summary of Care: Electronic Signature(s) [name]: 01/14/2021 5:38:02 PM By: [name] Entered By: [name] on 01/14/2021 08:19:27 -------------------------------------------------------------------------------- Patient/Caregiver Education Details Patient Name: Date of Service: [name], [name] Medical Record Number: [id number] Patient Account Number: [id number] Date of Birth/Gender: Treating RN: [birth year] (65 y.o. [name] Primary Care Physician: [name] Other Clinician: Referring Physician: Treating Physician/Extender: [name] in Treatment: 13 Education Assessment Education Provided To: Patient Education Topics Provided Wound/Skin Impairment: Handouts: Skin Care Do's and Dont's Methods: Explain/Verbal Responses: Reinforcements needed Electronic Signature(s) [name]: 01/14/2021 5:38:02 PM By: [name] Entered By: [name] on 01/14/2021  08:19:16 -------------------------------------------------------------------------------- Wound Assessment Details Patient Name: Date of Service: [facility] [name], [name] 01/14/2021 7:30 A [name] Medical Record Number: [id number] Patient Account Number: [id number] Date of Birth/Sex: Treating RN: [birth year] (65 y.o. [name] Primary Care Graviela Nodal: [name] Other Clinician: Referring Allissa Albright: Treating [name]/Extender: [name] in Treatment: 51 Wound Status Wound Number: 15 Primary Etiology: Venous Leg Ulcer Wound Location: Left, Medial Malleolus Wound Status: Open Wounding Event: Gradually Appeared Date Acquired: 12/30/2019 Weeks Of Treatment: 51 Clustered Wound: No Wound Measurements Length: (cm) 3.1 Width: (cm) 2.2 Depth: (cm) 0.1 Area: (cm) 5.356 Volume: (cm) 0.536 %  Reduction in Area: 54.1% % Reduction in Volume: 94.3% Wound Description Classification: Full Thickness With Exposed Support Structure Exudate Amount: Medium Exudate Type: Serosanguineous Exudate Color: red, brown s Electronic Signature(s) [name]: 01/14/2021 5:36:52 PM By: [name] [name]: 01/15/2021 11:32:43 AM By: [name] Entered By: [name] on 01/14/2021 07:47:07 -------------------------------------------------------------------------------- Vitals Details Patient Name: Date of Service: [name], [name] 01/14/2021 7:30 A [name] Medical Record Number: [id number] Patient Account Number: [id number] Date of Birth/Sex: Treating RN: Nov 03, 1955 (65 y.o. [name] Primary Care Simmie Camerer: [name] Other Clinician: Referring [name]: Treating [name]/Extender: [name] in Treatment: 42 Vital Signs Time Taken: 07:56 Temperature (F): 98.1 Height (in): 68 Pulse (bpm): 58 Respiratory Rate (breaths/min): 18 Blood Pressure (mmHg): 121/56 Reference Range: 80 - 120 mg / dl Electronic Signature(s) [name]:  01/15/2021 11:32:43 AM By: [name] Entered By: [name] on 01/14/2021 07:46:57

## 2021-01-15 NOTE — Progress Notes (Signed)
[name], [name] ([id number]) Visit Report for 01/14/2021 SuperBill Details Patient Name: Date of Service: [facility] [name], [name] 01/14/2021 Medical Record Number: [id number] Patient Account Number: [id number] Date of Birth/Sex: Treating RN: [birth year] (65 y.o. [name], [name] Primary Care Provider: [name] Other Clinician: Referring Provider: Treating Provider/Extender: [name] in Treatment: 96 Diagnosis Coding ICD-10 Codes Code Description [phone] Chronic venous hypertension (idiopathic) with ulcer and inflammation of left lower extremity L97.828 Non-pressure chronic ulcer of other part of left lower leg with other specified severity L97.328 Non-pressure chronic ulcer of left ankle with other specified severity Facility Procedures CPT4 Code Description Modifier Quantity [id number] (Facility Use Only) [phone] - APPLY MULTLAY COMPRS LWR LT LEG 1 Electronic Signature(s) Signed: 01/14/2021 5:38:02 PM By: Deon Pilling Signed: 01/15/2021 4:37:55 PM By: Kalman Shan DO Entered By: Deon Pilling on 01/14/2021 08:19:38

## 2021-01-23 ENCOUNTER — Encounter (HOSPITAL_BASED_OUTPATIENT_CLINIC_OR_DEPARTMENT_OTHER): Payer: Medicare Other | Admitting: Physician Assistant

## 2021-01-23 ENCOUNTER — Other Ambulatory Visit: Payer: Self-pay

## 2021-01-23 DIAGNOSIS — I87332 Chronic venous hypertension (idiopathic) with ulcer and inflammation of left lower extremity: Secondary | ICD-10-CM | POA: Diagnosis not present

## 2021-01-23 NOTE — Progress Notes (Addendum)
[name], EURE ([id number]) Visit Report for 01/23/2021 Chief Complaint Document Details Patient Name: Date of Service: [facility] [name], [name] 01/23/2021 10:45 A M Medical Record Number: [id number] Patient Account Number: [id number] Date of Birth/Sex: Treating RN: 09-07-1955 (65 y.o. [name] Provider: [name] Other Clinician: Referring Provider: Treating Provider/Extender: [name] in Treatment: 69 Information Obtained from: Patient Chief Complaint patient is been followed long-term in this clinic for venous insufficiency ulcers with inflammation, hypertension and ulceration over the medial ankle bilaterally. 01/17/2020; this is a patient who is here for review of postoperative wounds on the left lateral ankle and recurrence of venous stasis ulceration on the left medial Electronic Signature(s) Signed: 01/23/2021 11:13:23 AM By: [name] PA-C Entered By: [name] on 01/23/2021 11:13:23 -------------------------------------------------------------------------------- Cellular or Tissue Based Product Details Patient Name: Date of Service: [facility] [name], [name] 01/23/2021 10:45 A M Medical Record Number: [id number] Patient Account Number: [id number] Date of Birth/Sex: Treating RN: January 16, 1956 (65 y.o. [name] Provider: [name] Other Clinician: Referring Provider: Treating Provider/Extender: [name] in Treatment: 67 Cellular or Tissue Based Product Type Wound #15 Left,Medial Malleolus Applied to: Performed By: Physician [name], PA Cellular or Tissue Based Product Type: Puraply AM Level of Consciousness (Pre-procedure): Awake and Alert Pre-procedure Verification/Time Out Yes - 11:22 Taken: Location: trunk / arms / legs Wound Size (sq cm): 8.16 Product Size (sq cm): 12 Waste Size (sq cm): 0 Amount of Product Applied (sq cm): 12 Lot #[id number] Order #: [id number] Expiration Date: 04/23/2023 Fenestrated: No Reconstituted: Yes Solution Type: saline Solution Amount: 2 ml Lot #: [id number] Solution Expiration Date: 01/31/2022 Secured: No Dressing Applied: Yes Primary Dressing: adaptic, gauze Procedural Pain: 0 Post Procedural Pain: 0 Response to Treatment: Procedure was tolerated well Level of Consciousness (Post- Awake and Alert procedure): Post Procedure Diagnosis Same as Pre-procedure Electronic Signature(s) Signed: 01/23/2021 5:35:19 PM By: Baruch Gouty RN, BSN Signed: 01/23/2021 6:37:42 PM By: [name] PA-C Entered By: Baruch Gouty on 01/23/2021 11:27:02 -------------------------------------------------------------------------------- Debridement Details Patient Name: Date of Service: [name], [name] 01/23/2021 10:45 A M Medical Record Number: [id number] Patient Account Number: [id number] Date of Birth/Sex: Treating RN: 06-10-1955 (65 y.o. [name] Provider: [name] Other Clinician: Referring Provider: Treating Provider/Extender: [name] in Treatment: 66 Debridement Performed for Assessment: Wound #15 Left,Medial Malleolus Performed By: Physician [name], PA Debridement Type: Debridement Severity of Tissue Pre Debridement: Fat layer exposed Level of Consciousness (Pre-procedure): Awake and Alert Pre-procedure Verification/Time Out Yes - 11:15 Taken: Start Time: 11:15 Pain Control: Lidocaine 5% topical ointment T Area Debrided (L x W): otal 3.4 (cm) x 2.4 (cm) = 8.16 (cm) Tissue and other material debrided: Viable, Non-Viable, Slough, Subcutaneous, Slough Level: Skin/Subcutaneous Tissue Debridement Description: Excisional Instrument: Curette Bleeding: Minimum Hemostasis Achieved: Pressure End Time: 11:20 Procedural Pain: 3 Post Procedural Pain: 1 Response to Treatment: Procedure was tolerated well Level of Consciousness  (Post- Awake and Alert procedure): Post Debridement Measurements of Total Wound Length: (cm) 3.4 Width: (cm) 2.4 Depth: (cm) 0.1 Volume: (cm) 0.641 Character of Wound/Ulcer Post Debridement: Improved Severity of Tissue Post Debridement: Fat layer exposed Post Procedure Diagnosis Same as Pre-procedure Electronic Signature(s) Signed: 01/23/2021 5:35:19 PM By: Baruch Gouty RN, BSN Signed: 01/23/2021 6:37:42 PM By: [name] PA-C Entered By: Baruch Gouty on 01/23/2021 11:24:28 -------------------------------------------------------------------------------- HPI  Details Patient Name: Date of Service: [facility], [name] 01/23/2021 10:45 A M Medical Record Number: [id number] Patient Account Number: [id number] Date of Birth/Sex: Treating RN: 1956-04-25 (65 y.o. [name] Provider: [name] Other Clinician: Referring Provider: Treating Provider/Extender: [name] in Treatment: 49 History of Present Illness HPI Description: the remaining wound is over the left medial ankle. Similar wound over the right medial ankle healed largely with use of Apligraf. Most recently we have been using Hydrofera Blue over this wound with considerable improvement. The patient has been extensively worked up in the past for her venous insufficiency and she is not a candidate for antireflux surgery although I have none of the details available currently. 08/24/14; considerable improvement today. About 50% of this wound areas now epithelialized. The base of the wound appears to be healthier granulation.as opposed to last week when she had deteriorated a considerable improvement 08/17/14; unfortunately the wound has regressed somewhat. The areas of epithelialization from the superior aspect are not nearly as healthy as they were last week. The patient thinks her Hydrofera Blue slipped. 09/07/14; unfortunately the area has markedly regressed in the 2 weeks  since I've seen this. There is an odor surrounding erythema. The healthy granulation tissue that we had at the base of the wound now is a dusky color. The nurse reports green drainage 09/14/14; the area looks somewhat better than last week. There is less erythema and less drainage. The culture I did did not show any growth. Nevertheless I think it is better to continue the Cipro and doxycycline for a further week. The remaining wound area was debridement. 09/21/14. Wound did not require debridement last week. Still less erythema and less drainage. She can complete her antibiotics. The areas of epithelialization in the superior aspect of the wound do not look as healthy as they did some weeks ago 10/05/14 continued improvement in the condition of this wound. There is advancing epithelialization. Less aggressive debridement required 10/19/14 continued improvement in the condition and volume of this wound. Less aggressive debridement to the inferior part of this to remove surface slough and fibrinous eschar 11/02/14 no debridement is required. The surface granulation appears healthy although some of her islands of epithelialization seem to have regressed. No evidence of infection 11/16/14; lites surface debridement done of surface eschar. The wound does not look to be unhealthy. No evidence of infection. Unfortunately the patient has had podiatry issues in the right foot and for some reason has redeveloped small surface ulcerations in the medial right ankle. Her original presentation involved wounds in this area 11/23/14 no debridement. The area on the right ankle has enlarged. The left ankle wound appears stable in terms of the surface although there is periwound inflammation. There has been regression in the amount of new skin 11/30/14 no debridement. Both wound areas appear healthy. There was no evidence of infection. The the new area on the right medial ankle has enlarged although that both the surfaces  appear to be stable. 12/07/14; Debridement of the right medial ankle wound. No no debridement was done on the left. 12/14/14 no major change in and now bilateral medial ankle wounds. Both of these are very painful but the no overt evidence of infection. She has had previous venous ablation 12/21/14; patient states that her right medial ankle wound is considerably more painful last week than usual. Her left is also somewhat painful. She could not tolerate debridement. The right medial ankle wound  has fibrinous surface eschar 12/28/14 this is a patient with severe bilateral venous insufficiency ulcers. For a considerable period of time we actually had the one on the right medial ankle healed however this recently opened up again in June. The left medial ankle wound has been a refractory area with some absent flows. We had some success with Hydrofera Blue on this area and it literally closed by 50% however it is recently opened up Foley. Both of these were debridement today of surface eschar. She tolerates this poorly 01/25/15: No change in the status of this. Thick adherent escar. Very poor tolerance of any attempt at debridement. I had healed the right medial malleolus wound for a considerable amount of time and had the left one down to about 50% of the volume although this is totally regressed over the last 48 weeks. Further the right leg has reopened. she is trying to make a appointment with pain and vascular, previous ablations with Dr. Aleda Grana. I do not believe there is an arterial insufficiency issue here 02/01/15 the status of the adherent eschar bilaterally is actually improved. No debridement was done. She did not manage to get vascular studies done 02/08/15 continued debridement of the area was done today. The slough is less adherent and comes off with less pressure. There is no surrounding infection peripheral pulses are intact 02/15/15 selective debridement with a disposable curette. Again the  slough is less adherent and comes off with less difficulty. No surrounding infection peripheral pulses are intact. 02/22/15 selective debridement of the right medial ankle wound. Slough comes off with less difficulty. No obvious surrounding infection peripheral pulses are intact I did not debridement the one on the left. Both of these are stable to improved 03/01/15 selective debridement of both wound areas using a curette to. Adherent slough cup soft with less difficulty. No obvious surrounding infection. The patient tells me that 2 days ago she noted a rash above the right leg wrap. She did not have this on her lower legs when she change this over she arrives with widespread left greater than right almost folliculitis-looking rash which is extremely pruritic. I don't see anything to culture here. There is no rash on the rest of her body. She feels well systemically. 03/08/15; selective debridement of both wounds using a curette. Base of this does not look unhealthy. She had limegreen drainage coming out of the left leg wound and describes a lot of drainage. The rash on her left leg looks improved to. No cultures were done. 03/22/15; patient was not here last week. Basal wounds does not look healthy and there is no surrounding erythema. No drainage. There is still a rash on the left leg that almost looks vasculitic however it is clearly limited to the top of where the wrap would be. 04/05/15; on the right required a surgical debridement of surface eschar and necrotic subcutaneous tissue. I did not debridement the area on the left. These continue to be large open wounds that are not changing that much. We were successful at one point in healing the area on the right, and at the same time the area on the left was roughly half the size of current measurements. I think a lot of the deterioration has to do with the prolonged time the patient is on her feet at work 04/19/15 I attempted-like surface  debridement bilaterally she does not tolerate this. She tells me that she was in allergic care yesterday with extreme pain over her left lateral malleolus/ankle  and was told that she has an "sprain" 05/03/15; large bilateral venous insufficiency wounds over the medial malleolus/medial aspect of her ankles. She complains of copious amounts of drainage and his usual large amounts of pain. There is some increasing erythema around the wound on the right extending into the medial aspect of her foot to. historically she came in with these wounds the right one healed and the left one came down to roughly half its current size however the right one is reopened and the left is expanded. 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 05/10/15 large bilateral venous insufficiency wounds. There is less adherence surface left however the surface culture that I did last week grew pseudomonas therefore bilateral selective debridement score necessary. There is surrounding erythema. The patient describes severe bilateral drainage and a lot of pain in the left ankle. Apparently her podiatrist was were ready to do a cortisone shot 05/17/15; the patient complains of pain and again copious amounts of drainage. 05/24/15; we used Iodo flex last week. Patient notes considerable improvement in wound drainage. Only needed to change this once. 05/31/15; we continued Iodoflex; the base of these large wounds bilaterally is not too bad but there is probably likely a significant bioburden here. I would like to debridement just doesn't tolerate it. 06/06/14 I would like to continue the Iodoflex although she still hasn't managed to obtain supplies. She has bilateral medial malleoli or large wounds which are mostly superficial. Both of them are covered circumferentially with some nonviable fibrinous slough although she tolerates debridement very poorly. She apparently has an appointment for  an ablation on the right leg by interventional radiology. 06/14/15; the patient arrives with the wounds and static condition. We attempted a debridement although she does not do well with this secondary to pain. I 07/05/15; wounds are not much smaller however there appears to be a cleaner granulating base. The left has tight fibrinous slough greater than the right. Debridement is tolerated poorly due to pain. Iodoflex is done more for these wounds in any of the multitude of different dressings I have tried on the left 1 and then subsequently the right. 07/12/15; no change in the condition of this wound. I am able to do an aggressive debridement on the right but not the left. She simply cannot tolerate it. We have been using Iodoflex which helps somewhat. It is worthwhile remembering that at one point we healed the right medial ankle wound and the left was about 25% of the current circumference. We have suggested returning to vascular surgery for review of possible further ablations for one reason or another she has not been able to do this. 07/26/15 no major change in the condition of either wound on her medial ankle. I did not attempt to debridement of these. She has been aggressively scrubbing these while she is in the shower at home. She has her supply of Iodoflex which seems to have done more for these wounds then anything I have put on recently. 08/09/15 wound area appears larger although not verified by measurements. Using Iodoflex 09/05/2015 -- she was here for avisit today but had significant problems with the wound and I was asked to see her for a physician opinion. I have summarize that this lady has had surgery on her left lower extremity about 10 years ago where the possible veins stripping was done. She has had an opinion from interventional radiology around November  2016 where no further sclerotherapy was ordered. The patient works 12 hours a day and stands on a concrete floor with work boots  and is unable to get the proper compression she requires and cannot elevate her limbs appropriately at any given time. She has recently grown Pseudomonas from her wound culture but has not started her ciprofloxacin which was called in for her. 09/13/15 this continues to be a difficult situation for this patient. At one point I had this wound down to a 1.5 x 1.5" wound on her left leg. This is deteriorated and the right leg has reopened. She now has substantial wounds on her medial calcaneus, malleoli and into her lower leg. One on the left has surface eschar but these are far too painful for me to debridement here. She has a vascular surgery appointment next week to see if anything can be done to help here. I think she has had previous ablations several years ago at [facility]. She has no major edema. She tells me that she did not get product last time [facility] Ag] and went for several days without it. She continues to work in work boots 12 hours a day. She cannot get compression/4-layer under her work boots. 09/20/15 no major change. Periwound edema control was not very good. Her point with pain and vascular is next Wednesday the 25th 09/28/15; the patient is seen vascular surgery and is apparently scheduled for repeat duplex ultrasounds of her bilateral lower legs next week. 10/05/15; the patient was seen by Dr. Doren Custard of vascular surgery. He feels that she should have arterial insufficiency excluded as cause/contributed to her nonhealing stage she is therefore booked for an arteriogram. She has apparently monophasic signals in the dorsalis pedis pulses. She also of course has known severe chronic venous insufficiency with previous procedures as noted previously. I had another long discussion with the patient today about her continuing to work 12 hour shifts. I've written her out for 2 months area had concerns about this as her work location is currently undergoing significant turmoil and this may lead  to her termination. She is aware of this however I agree with her that she simply cannot continue to stand for 12 hours multiple days a week with the substantial wound areas she has. 10/19/15; the Dr. Doren Custard appointment was largely for an arteriogram which was normal. She does not have an arterial issue. He didn't make a comment about her chronic venous insufficiency for which she has had previous ablations. Presumably it was not felt that anything additional could be done. The patient is now out of work as I prescribed 2 weeks ago. Her wounds look somewhat less aggravated presumably because of this. I felt I would give debridement another try today 10/25/15; no major change in this patient's wounds. We are struggling to get her product that she can afford into her own home through her insurance. 11/01/15; no major change in the patient's wounds. I have been using silver alginate as the most affordable product. I spoke to Dr. Marla Roe last week with her requested take her to the OR for surgical debridement and placement of ACEL. Dr. Marla Roe told me that she would be willing to do this however [facility] will not cover this, fortunately the patient has [place] healthcare of some variant 11/08/15; no major change in the patient's wounds. She has been completely nonviable surface that this but is in too much pain with any attempted debridement are clinic. I have arranged for  her to see Dr. Marla Roe ham of plastic surgery and this appointment is on Monday. I am hopeful that they will take her to the OR for debridement, possible ACEL ultimately possible skin graft 11/22/15 no major change in the patient's wounds over her bilateral medial calcaneus medial malleolus into the lower legs. Surface on these does not look too bad however on the left there is surrounding erythema and tenderness. This may be cellulitis or could him sleepy tinea. 11/29/15; no major changes in the patient's wounds over her  bilateral medial malleolus. There is no infection here and I don't think any additional antibiotics are necessary. There is now plan to move forward. She sees Dr. Marla Roe in a week's time for preparation for operative debridement and ACEL placement I believe on 7/12. She then has a follow-up appointment with Dr. Marla Roe on 7/21 12/28/15; the patient returns today having been taken to the [place] by Dr. Marla Roe 12/12/15 she underwent debridement, intraoperative cultures [which were negative]. She had placement of a wound VAC. Parent really ACEL was not available to be placed. The wound VAC foam apparently adhered to the wound since then she's been using silver alginate, Xeroform under Ace wraps. She still says there is a lot of drainage and a lot of pain 01/31/16; this is a patient I see monthly. I had referred her to Dr. Marla Roe him of plastic surgery for large wounds on her bilateral medial ankles. She has been to the OR twice once in early July and once in early August. She tells me over the last 3 weeks she has been using the wound VAC with ACEL underneath it. On the right we've simply been using silver alginate. Under Kerlix Coban wraps. 02/28/16; this is a patient I'm currently seeing monthly. She is gone on to have a skin graft over her large venous insufficiency ulcer on the left medial ankle. This was done by Dr. Marla Roe him. The patient is a bit perturbed about why she didn't have one on her right medial ankle wound. She has been using silver alginate to this. 03/06/16; I received a phone call from her plastic surgery Dr. Marla Roe. She expressed some concern about the viability of the skin graft she did on the left medial ankle wound. Asked me to place Endoform on this. She told me she is not planning to do a subsequent skin graft on the right as the left one did not take very well. I had placed Hydrofera Blue on the right 03/13/16; continue to have a reasonably healthy wound on the  right medial ankle. Down to 3 mm in terms of size. There is epithelialization here. The area on the left medial ankle is her skin graft site. I suppose the last week this looks somewhat better. She has an open area inferiorly however in the center there appears to be some viable tissue. There is a lot of surface callus and eschar that will eventually need to come off however none of this looked to be infected. Patient states that the is able to keep the dressing on for several days which is an improvement. 03/20/16 no major change in the circumference of either wound however on the left side the patient was at Dr. Eusebio Friendly office and they did a debridement of left wound. 50% of the wound seems to be epithelialized. I been using Endoform on the left Hydrofera Blue in the right 03/27/16; she arrives today with her wound is not looking as healthy as they did last week.  The area on the right clearly has an adherent surface to this a very similar surface on the left. Unfortunately for this patient this is all too familiar problem. Clearly the Endoform is not working and will need to change that today that has some potential to help this surface. She does not tolerate debridement in this clinic very well. She is changing the dressing wants 04/03/16; patient arrives with the wounds looking somewhat better especially on the right. Dr. Migdalia Dk change the dressing to silver alginate when she saw her on Monday and also sold her some compression socks. The usefulness of the latter is really not clear and woman with severely draining wounds. 04/10/16; the patient is doing a bit of an experiment wearing the compression stockings that Dr. Migdalia Dk provided her to her left leg and the out of legs based dressings that we provided to the right. 05/01/16; the patient is continuing to wear compression stockings Dr. Migdalia Dk provided her on the left that are apparently silver impregnated. She has been using Iodoflex to the  right leg wound. Still a moderate amount of drainage, when she leaves here the wraps only last for 4 days. She has to change the stocking on the left leg every night 05/15/16; she is now using compression stockings bilaterally provided by Dr. Marla Roe. She is wearing a nonadherent layer over the wounds so really I don't think there is anything specific being done to this now. She has some reduction on the left wound. The right is stable. I think all healing here is being done without a specific dressing 06/09/16; patient arrives here today with not much change in the wound certainly in diameter to large circular wounds over the medial aspect of her ankle bilaterally. Under the light of these services are certainly not viable for healing. There is no evidence of surrounding infection. She is wearing compression stockings with some sort of silver impregnation as prescribed by Dr. Marla Roe. She has a follow-up with her tomorrow. 06/30/16; no major change in the size or condition of her wounds. These are still probably covered with a nonviable surface. She is using only her purchase stockings. She did see Dr. Marla Roe who seemed to want to apply Dakin's solution to this I'm not extreme short what value this would be. I would suggest Iodoflex which she still has at home. 07/28/16; I follow Mrs. Mckinstry episodically along with Dr. Marla Roe. 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 She has been applying a topical collagen ointment to both wounds with Adaptic. I don't think Dr. Marla Roe is planning to take her back to the OR. 08/19/16; I follow Mrs. Jeneen Rinks on a monthly basis along with Dr. Marla Roe of plastic surgery. 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 I been following her for a number of years. At one point I was able to get the right medial malleolus wound  to heal and had the left medial malleolus down to about half its current size however and I had to send her to plastic surgery for an operative debridement. Since then things have been stable to slightly improve the area on the right is slightly better one in the left about the same although there is much less adherent surface than I'm used to with this patient. She is using some form of liquid collagen gel that Dr. Marla Roe provided a Kerlix cover with the patient's own pressure stockings.  She tells me that she has extreme pain in both ankles and along the lateral aspect of both feet. She has been unable to work for some period of time. She is telling me she is retiring at the beginning of April. She sees Dr. Doran Durand of orthopedics next week 09/22/16; patient has not seen Dr. Marla Roe since the last time she is here. I'm not really sure what she is using to the wounds other than bits and pieces of think she had left over including most recently Hydrofera Blue. She is using juxtalite stockings. She is having difficulty with her husband's recent illness "stroke". She is having to transport him to various doctors appointments. Dr. Marla Roe left her the option of a repeat debridement with ACEL however she has not been able to get the time to follow-up on this. She continues to have a fair amount of drainage out of these wounds with certainly precludes leaving dressings on all week 10/13/16; patient has not seen Dr. Marla Roe since she was last in our clinic. I'm not really sure what she is doing with the wounds, we did try to get her [facility] and I think she is actually using this most of the time. Because of drainage she states she has to change this every second day although this is an improvement from what she used to do. She went to see Dr. Doran Durand who did not think she had a muscular issue with regards to her feet, he referred her to a neurologist and I think the appointment is sometime in  June. I changed her back to Iodoflex which she has used in the past but not recently. 11/03/16; the patient has been using Iodoflex although she ran out of this. Still claims that there is a lot of drainage although the wound does not look like this. No surrounding erythema. She has not been back to see Dr. Marla Roe 11/24/16; the patient has been using Iodoflex again but she ran out of it 2 or 3 days ago. There is no major change in the condition of either one of these wounds in fact they are larger and covered in a thick adherent surface slough/nonviable tissue especially on the left. She does not tolerate mechanical debridement in our clinic. Going back to see Dr. Marla Roe of plastic surgery for an operative debridement would seem reasonable. 12/15/16; the patient has not been back to see Dr. Marla Roe. She is been dealing with a series of illnesses and her husband which of monopolized her time. She is been using Sorbact which we largely supplied. She states the drainage is bad enough that it maximum she can go 2-3 days without changing the dressing 01/12/2017 -- the patient has not been back for about 4 weeks and has not seen Dr. Marla Roe not does she have any appointment pending. 01/23/17; patient has not seen Dr. Marla Roe even though I suggested this previously. She is using Santyl that was suggested last week by Dr. Con Memos this Cost her $16 through her insurance which is indeed surprising 02/12/17; continuing Santyl and the patient is changing this daily. A lot of drainage. She has not been back to see plastic surgery she is using an Ace wrap. Our intake nurse suggested wrap around stockings which would make a good reasonable alternative 02/26/17; patient is been using Santyl and changing this daily due to drainage. She has not been to see plastic surgery she uses in April Ace wrap to control the edema. She did obtain extremitease stockings but stated  that the edema in her leg was to big for  these 03/20/17; patient is using Santyl and Anasept. Surfaces looked better today the area on the right is actually measuring a little smaller. She has states she has a lot of pain in her feet and ankles and is asking for a consult to pain control which I'll try to help her with through our case manager. 04/10/17; the patient arrives with better-looking wound surfaces and is slightly smaller wound on the left she is using a combination of Santyl and Anasept. She has an appointment or at least as started in the pain control center associated with [facility] 05/14/17; this is a patient who I followed for a prolonged period of time. She has venous insufficiency ulcers on her bilateral medial ankles. At one point I had this down to a much smaller wound on the left however these reopened and we've never been able to get these to heal. She has been using Santyl and Anasept gel although 2 weeks ago she ran out of the Anasept gel. She has a stable appearance of the wound. She is going to the wound care clinic at [facility]. They wanted do a nerve block/spinal block although she tells me she is reluctant to go forward with that. 05/21/17; this is a patient I have followed for many years. She has venous insufficiency ulcers on her bilateral medial ankles. Chronic pain and deformity in her ankles as well. She is been to see plastic surgery as well as orthopedics. Using PolyMem AG most recently/Kerramax/ABDs and 2 layer compression. She has managed to keep this on and she is coming in for a nurse check to change the dressing on Tuesdays, we see her on Fridays 06/05/17; really quite a good looking surface and the area especially on the right medial has contracted in terms of dimensions. Well granulated healthy-looking tissue on both sides. Even with an open curet there is nothing that even feels abnormal here. This is as good as I've seen this in quite some time. We have been using PolyMem AG and  bringing her in for a nurse check 06/12/17; really quite good surface on both of these wounds. The right medial has contracted a bit left is not. We've been using PolyMem and AG and she is coming in for a nurse visit 06/19/17; we have been using PolyMem AG and bringing her in for a nurse check. Dimensions of her wounds are not better but the surfaces looked better bilaterally. She complained of bleeding last night and the left wound and increasing pain bilaterally. She states her wound pain is more neuropathic than just the wounds. There was some suggestion that this was radicular from her pain management doctor in talking to her it is really difficult to sort this out. 06/26/17; using PolyMem and AG and bringing her in for a nurse check as All of this and reasonably stable condition. Certainly not improved. The dimensions on the lateral part of the right leg look better but not really measuring better. The medial aspect on the left is about the same. 07/03/16; we have been using PolyMen AG and bringing her in for a nurse check to change the dressings as the wounds have drainage which precludes once weekly changing. We are using all secondary absorptive dressings.our intake nurse is brought up the idea of using a wound VAC/snap VAC on the wound to help with the drainage to see if this would result in some contraction. This is not a  bad idea. The area on the right medial is actually looking smaller. Both wounds have a reasonable-looking surface. There is no evidence of cellulitis. The edema is well controlled 07/10/17; the patient was denied for a snap VAC by her insurance. The major issue with these wounds continues to be drainage. We are using wicked PolyMem AG and she is coming in for a nurse visit to change this. The wounds are stable to slightly improved. The surface looks vibrant and the area on the right certainly has shrunk in size but very slowly 07/17/17; the patient still has large wounds on her  bilateral medial malleoli. Surface of both of these wounds looks better. The dimensions seem to come and go but no consistent improvement. There is no epithelialization. We do not have options for advanced treatment products due to insurance issues. They did not approve of the wound VAC to help control the drainage. More recently we've been using PolyMem and AG wicked to allow drainage through. We have been bringing her in for a nurse visit to change this. We do not have a lot of options for wound care products and the home again due to insurance issues 07/24/17; the patient's wound actually looks somewhat better today. No drainage measurements are smaller still healthy-looking surface. We used silver collagen under PolyMen started last week. We have been bringing her in for a dressing change 07/31/17; patient's wound surface continued to look better and I think there is visible change in the dimensions of the wound on the right. Rims of epithelialization. We have been using silver collagen under PolyMen and bringing her in for a dressing change. There appears to be less drainage although she is still in need of the dressing change 08/07/17. Patient's wound surface continues to look better on both sides and the area on the right is definitely smaller. We have been using silver collagen and PolyMen. She feels that the drainage has been it has been better. I asked her about her vascular status. She went to see Dr. Aleda Grana at [facility] and had some form of ablation. I don't have much detail on this. I haven't my notes from 2016 that she was not a candidate for any further ablation but I don't have any more information on this. We had referred her to vein and vascular I don't think she ever went. He does not have a history of PAD although I don't have any information on this either. We don't even have ABIs in our record 08/14/17; we've been using silver collagen and PolyMen cover. And putting the  patient and compression. She we are bringing her in as a nurse visit to change this because ofarge amount of drainage. We didn't the ABIs in clinic today since they had been done in many moons 1.2 bilaterally. She has been to see vein and vascular however this was at [facility] and she had ablation although I really don't have any information on this all seemed biking get a report. She is also been operatively debrided by plastic surgery and had a cell placed probably 8-12 months ago. This didn't have a major effect. We've been making some gains with current dressings 08/19/17-She is here in follow-up evaluation for bilateral medial malleoli ulcers. She continues to tolerate debridement very poorly. We will continue with recently changed topical treatment; if no significant improvement may consider switching to Iodosorb/Iodoflex. 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 She has been using silver collagen and  PolyMem. I believe she has been used and tried on Iodoflex before. 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 These have been very refractory over time. We have been using silver collagen and PolyMen. She is coming in once a week for a doctor's and once a week for nurse visits. We are actually making some progress 09/18/17; the patient's wounds are smaller especially on the right medial. She arrives today to upset to consider even washing these off with Anasept which I think is been part of the reason this is been closing. We've been using collagen covered in PolyMen otherwise. It is noted that she has a small area of folliculitis on the right medial calf that. As we are wrapping her legs I'll give her a short course of doxycycline to make sure this doesn't  amount to anything. She is a long list of complaints today including imbalance, shortness of breath on exertion, inversion of her left ankle. With regards to the latter complaints she is been to see orthopedics and they offered her a tendon release surgery I believe but wanted her wounds to be closed first. I have recommended she go see her primary physician with regards to everything else. 09/25/17; patient's wounds are about the same size. We have made some progress bilaterally although not in recent weeks. She will not allow me T wash these wounds with Anasept even if she is doing her cell. Wheeze we've been using collagen covered in PolyMen. Last week she had a small area of folliculitis this is now opened into a small wound. She completed 5 days of trimethoprim sulfamethoxazole 10/02/17; unfortunately the area on her left medial ankle is worse with a larger wound area towards the Achilles. The patient complains of a lot of pain. She will not allow debridement although visually I don't think there is anything to debridement in any case. We have been using silver collagen and PolyMen for several months now. Initially we are making some progress although I'm not really seeing that today. We will move back to [facility]. His admittedly this is a bit of a repeat however I'm hoping that his situation is different now. The patient tells me she had her leg on the left give out on her yesterday this is process some pain. 10/09/17; the patient is seen twice a week largely because of drainage issues coming out of the chronic medial bimalleolar wounds that are chronic. Last week the dimensions of the one on the left looks a little larger I changed her to [facility]. She comes in today with a history of terrible pain in the bilateral wound areas. She will not allow debridement. She will not even allow a tissue culture. There is no surrounding erythema no no evidence of cellulitis. We have been putting her  Kerlix Coban man. She will not allow more aggressive compression as there was a suggestion to put her in 3 layer wraps. 10/16/17; large wounds on her bilateral medial malleoli. These are chronic. Not much change from last week. The surface looks have healthy but absolutely no epithelialization. A lot of pain little less so of drainage. She will not allow debridement or even washing these off in the vigorous fashion with Anasept. 10/23/17; large wounds on her bilateral malleoli which are chronic. Some improvement in terms of size  perhaps on the right since last time I saw these. She states that after we increased the 3 layer compression there was some bleeding, when she came in for a nurse visit she did not want 3 layer compression put back on about our nurse managed to convince her. She has known chronic venous visit issues and I'm hoping to get her to tolerate the 3 layer compression. using Hydrofera Blue 10/30/17; absolutely no change in the condition of either wound although we've had some improvement in dimensions on the right.. Attempted to put her in 3 layer compression she didn't tolerated she is back in 2 layer compression. We've been using Hydrofera Blue We looked over her past records. She had venous reflux studies in November 2016. There was no evidence of deep venous reflux on the right. Superficial vein did not show the greater saphenous vein at think this is been previously ablated the small saphenous vein was within normal limits. The left deep venous system showed no DVT the vessels were positive for deep venous reflux in the posterior tibial veins at the ankle. The greater saphenous vein was surgically absent small saphenous vein was within normal limits. She went to vein and vascular at [facility]. I believe she had an ablation on the left greater saphenous vein. I'll update her reflux studies perhaps ever reviewed by vein and vascular. We've made absolutely no progress in these  wounds. Will also try to read and TheraSkins through her insurance 11/06/17; W the patient apparently has a 2 week follow-up with vein and vascular I like him to review the whole issue with regards to her previous vascular workup by Dr. Aleda Grana. We've really made no progress on these wounds in many months. She arrives today with less viable looking surface on the left medial ankle wound. This was apparently looking about the same on Tuesday when she was here for nurse visit. 11/13/17; deep tissue culture I did last time of the left lower leg showed multiple organisms without any predominating. In particular no Staphylococcus or group A strep were isolated. We sent her for venous reflux studies. She's had a previous left greater saphenous vein stripping and I think sclerotherapy of the right greater saphenous vein. She didn't really look at the lesser saphenous vein this both wounds are on the medial aspect. She has reflux in the common femoral vein and popliteal vein and an accessory vein on the right and the common femoral vein and popliteal vein on the left. I'm going to have her go to see vein and vascular just the look over things and see if anything else beside aggressive compression is indicated here. We have not been able to make any progress on these wounds in spite of the fact that the surface of the wounds is never look too bad. 11/20/17; no major change in the condition of the wounds. Patient reports a large amount of drainage. She has a lot of complaints of pain although enlisting her today I wonder if some of this at least his neuropathic rather than secondary to her wounds. She has an appointment with vein and vascular on 12/30/17. The refractory nature of these wounds in my mind at least need vein and vascular to look over the wounds the recent reflux studies we did and her history to see if anything further can be done here. I also note her gait is deteriorated quite a bit. Looks like  she has inversion of her foot on the right. She has a  bilateral Trendelenburg gait. I wonder if this is neuropathic or perhaps multilevel radicular. 11/27/17; her wounds actually looks slightly better. Healthy-looking granulation tissue a scant amount of epithelialization. [place] healthcare will not pay for [employer]. They will play for tri layer Oasis and Dermagraft. This is not a diabetic ulcer. We'll try for the tri layer Oasis. She still complains of some drainage. She has a vein and vascular appointment on 12/30/17 12/04/17; the wounds visually look quite good. Healthy-looking granulation with some degree of epithelialization. We are still waiting for response to our request for trial to try layer Oasis. Her appointment with vascular to review venous and arterial issues isn't sold the end of July 7/31. Not allow debridement or even vigorous cleansing of the wound surface. 12/18/17; slightly smaller especially on the right. 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 We've been using Hydrofera Blue. We still are looking into triple layer Oasis through her insurance 01/08/18 on evaluation today patient's wound actually appears to be showing signs of good improvement at this point in time. She has been tolerating the dressing changes without complication. Fortunately there does not appear to be any evidence of infection at this point in time. We have been utilizing silver nitrate which does seem to be of benefit for her which is also good news. Overall I'm very happy with how things seem to be both regards appearance as well as measurement. Patient did see Dr. Bridgett Larsson for evaluation on 12/30/17. In his assessment he felt that stripping would not likely add much more than chronic compression to the patient's healing process. His recommendation was to follow-up in three months with Dr. Doren Custard if she hasn't healed in order to consider referral back to you and see vascular where she  previously was in a trial and was able to get her wound to heal. I'll be see what she feels she when you staying compression and he reiterated this as well. 01/13/18 on evaluation today patient appears to actually be doing very well in regard to her bilateral medial malleolus ulcers. She seems to have tolerated the chemical cauterization with silver nitrate last week she did have some pain through that evening but fortunately states that I'll be see since it seems to be doing better she is overall pleased with the progress. 01/21/18; really quite a remarkable improvement since I've last seen these wounds. We started using silver nitrate specially on the islands of hyper granulation which for some reason her around the wound circumference. This is really done quite nicely. Primary dressing Hydrofera Blue under 4 layer compression. She seems to be able to hold out without a nurse rewrap. Follow-up in 1 week 01/28/18; we've continued the hydrofera blue but continued with chemical cauterization to the wound area that we started about a month ago for irregular hyper granulation. She is made almost stunning improvement in the overall wound dimensions. I was not really expecting this degree of improvement in these chronic wounds 02/05/18; we continue with Hydrofera Bluebut of also continued the aggressive chemical cauterization with silver nitrate. We made nice progress with the right greater than left wound. 02/12/18. We continued with Hydrofera Blue after aggressive chemical cauterization with silver nitrate. We appear to be making nice progress with both wound areas 02/19/2018; we continue with [facility] after washing the wounds vigorously with Anasept spray and chemical cauterization with silver nitrate. We are making excellent progress. The area on the right's just about closed 02/26/2018. The area on the left medial ankle had  too much necrotic debris today. I used a #5 curette we are able to get most of  the soft. I continued with the silver nitrate to the much smaller wound on the right medial ankle she had a new area on her right lower pretibial area which she says was due to a role in her compression 03/05/2018; both wound areas look healthy. Not much change in dimensions from last week. I continue to use silver nitrate and Hydrofera Blue. The patient saw Dr. Doren Custard of vein and vascular. He felt she had venous stasis ulcers. He felt based on her previous arteriogram she should have adequate circulation for healing. Also she has deep venous reflux but really no significant correctable superficial venous reflux at this time. He felt we should continue with conservative management including leg elevation and compression 04/02/2018; since we last saw this woman about a month ago she had a fall apparently suffered a pelvic fracture. I did not look up the x-ray. Nevertheless because of pain she literally was bedbound for 2 weeks and had home health coming out to change the dressing. Somewhat predictably this is resulted in considerable improvement in both wound areas. The right is just about closed on the medial malleolus and the left is about half the size. 04/16/2018; both her wounds continue to go down in size. Using Hydrofera Blue. 05/07/18; both her wounds appeared to be improving especially on the right where it is almost closed. We are using Hydrofera Blue 05/14/2018; slightly worse this week with larger wounds. Surface on the left medial not quite as good. We have been using Hydrofera Blue 05/21/18; again the wounds are slightly larger. Left medial malleolus slightly larger with eschar around the circumference. We have been using Hydrofera Blue undergoing a wraps for a prolonged period of time. This got a lot better when she was more recumbent due to a fall and a back injury. I change the primary dressing the silver alginate today. She did not tolerate a 4 layer compression previously although I may  need to bring this up with her next time 05/28/2018; area on the left medial malleolus again is slightly larger with more drainage. Area on the right is roughly unchanged. She has a small area of folliculitis on the right medial just on the lower calf. This does not look ominous. 06/03/2018 left medial malleolus slightly smaller in a better looking surface. We used silver nitrate on this last time with silver alginate. The area on the right appears slightly smaller 1/10; left medial malleolus slightly smaller. Small open area on the right. We used silver nitrate and silver alginate as of 2 weeks ago. We continue with the wound and compression. These got a lot better when she was off her feet 1/17; right medial malleolus wound is smaller. The left may be slightly smaller. Both surfaces look somewhat better. 1/24; both wounds are slightly smaller. Using silver alginate under Unna boots 1/31; both wounds appear smaller in fact the area on the right medial is just about closed. Surface eschar. We have been using silver alginate under Unna boots. The patient is less active now spends let much less time on her feet and I think this is contributed to the general improvement in the wound condition 2/7; both wounds appear smaller. I was hopeful the right medial would be closed however there there is still the same small open area. Slight amount of surface eschar on the left the dimensions are smaller there is eschar but the wound  edges appear to be free. We have been using silver alginate under Unna boot's 2/14; both wounds once again measure smaller. Circumferential eschar on the left medial. We have been using silver alginate under Unna boots with gradual improvement 2/21; the area on the right medial malleolus has healed. The area on the left is smaller. We have been using silver alginate and Unna boots. We can discharge wrapping the right leg she has 20/30 stockings at home she will need to protect the scar  tissue in this area 2/28; the area on the right medial malleolus remains closed the patient has a compression stocking. The area on the left is smaller. We have been using silver alginate and Unna boots. 3/6 the area on the right medial ankle remains closed. Good edema control noted she is using her own compression stocking. The area on the left medial ankle is smaller. We have been managing this with silver alginate and Unna boots which we will continue today. 3/13; the area on the right medial ankle remains closed and I'm declaring it healed today. When necessary the left is about the same still a healthy-looking surface but no major change and wound area. No evidence of infection and using silver alginate under unna and generally making considerable improvement 3/27 the area on the right medial ankle remains closed the area on the left is about the same as last week. Certainly not any worse we have been using silver alginate under an Unna boot 4/3; the area on the right medial ankle remains closed per the patient. We did not look at this wound. The wound on the left medial ankle is about the same surface looks healthy we have been using silver alginate under an Unna boot 4/10; area on the right medial ankle remains closed per the patient. We did not look at this wound. The wound on the left medial ankle is slightly larger. The patient complains that the [facility] caused burning pain all week. She also told us that she was a lot more active this week. Changed her back to silver alginate 4/17; right medial ankle still closed per the patient. Left medial ankle is slightly larger. Using silver alginate. She did not tolerate Hydrofera Blue on this area 4/24; right medial ankle remains closed we have not look at this. The left medial ankle continues to get larger today by about a centimeter. We have been using silver alginate under Unna boots. She complains about 4 layer compression as an  alternative. She has been up on her feet working on her garden 5/8; right medial ankle remains closed we did not look at this. The left medial ankle has increased in size about 100%. We have been using silver alginate under Unna boots. She noted increased pain this week and was not surprised that the wound is deteriorated 5/15; no major change in SA however much less erythema ( one week of doxy ocellulitis). 5/22-65 year old female returns at 1 week to the clinic for left medial ankle wound for which we have been using silver alginate under 3 layer compression She was placed on DOXY at last visit - the wound is wider at this visit. She is in 3 layer compression 5/29; change to [facility] last week. I had given her empiric doxycycline 2 weeks ago for a week. She is in 3 layer compression. She complains of a lot of pain and drainage on presentation today. 6/5; using Hydrofera Blue. I gave her doxycycline recently empirically for erythema and  pain around the wound. Believe her cultures showed enterococcus which not would not have been well covered by doxycycline nevertheless the wound looks better and I don't feel specifically that the enterococcus needs to be covered. She has a new what looks like a wrap injury on her lateral left ankle. 6/12; she is using Hydrofera Blue. She has a new area on the left anterior lower tibial area. This was a wrap injury last week. 6/19; the patient is using Hydrofera Blue. She arrived with marked inflammation and erythema around the wound and tenderness. 12/01/18 on evaluation today patient appears to be doing a little bit better based on what I'm hearing from the standpoint of lassos evaluation to this as far as the overall appearance of the wound is concerned. Then sometime substandard she typically sees Dr. Dellia Nims. Nonetheless overall very pleased with the progress that she's made up to this point. No fevers, chills, nausea, or vomiting noted at this time. 7/10;  some improvement in the surface area. Aggressively debrided last week apparently. I went ahead with the debridement today although the patient does not tolerate this very well. We have been using Iodoflex. Still a fair amount of drainage 7/17; slightly smaller. Using Iodoflex. 7/24; no change from last week in terms of surface area. We have been using Iodoflex. Surface looks and continues to look somewhat better 7/31; surface area slightly smaller better looking surface. We have been using Iodoflex. This is under Unna boot compression 8/7-Patient presents at 1 week with Unna boot and Iodoflex, wound appears better 8/14-Patient presents at 1 week with Iodoflex, we use the Unna boot, wound appears to be stable better.Patient is getting Botox treatment for the inversion of the foot for tendon release, Next week 8/21; we are using Iodoflex. Unna boot. The wound is stable in terms of surface area. Under illumination there is some areas of the wound that appear to be either epithelialized or perhaps this is adherent slough at this point I was not really clear. It did not wipe off and I was reluctant to debride this today. 8/28; we are using Iodoflex in an Unna boot. Seems to be making good improvement. 9/4; using Iodoflex and wound is slightly smaller. 9/18; we are using Iodoflex with topical silver nitrate when she is here. The wound continues to be smaller 10/2; patient missed her appointment last week due to GI issues. She left and Iodoflex based dressing on for 2 weeks. Wound is about the same size about the size of a dime on the left medial lower 10/9 we have been using Iodoflex on the medial left ankle wound. She has a new superficial probable wrap injury on the dorsal left ankle 10/16; we have been using Hydrofera Blue since last week. This is on the left medial ankle 10/23; we have been using Hydrofera Blue since 2 weeks ago. This is on the left medial ankle. Dimensions are better 11/6; using  Hydrofera Blue. I think the wound is smaller but still not closed. Left medial ankle 11/13; we have been using Hydrofera Blue. Wound is certainly no smaller this week. Also the surface not as good. This is the remanent of a very large area on her left medial ankle. 11/20; using Sorbact since last week. Wound was about the same in terms of size although I was disappointed about the surface debris 12/11; 3-week follow-up. Patient was on vacation. Wound is measuring slightly larger we have been using Sorbact. 12/18; wound is about the same size however surface  looks better last week after debridement. We have been using Sorbact under compression 1/15 wound is probably twice the size of last time increased in length nonviable surface. We have been using Sorbact. She was running a mild fever and missed her appointment last week 1/22; the wound is come down in size but under illumination still a very adherent debris we have been Hydrofera Blue that I changed her to last week 1/29; dimensions down slightly. We have been using Hydrofera Blue 2/19 dimensions are the same however there is rims of epithelialization under illumination. Therefore more the surface area may be epithelialized 2/26; the patient's wound actually measures smaller. The wound looks healthy. We have been using Hydrofera Blue. I had some thoughts about running Apligraf then I still may do that however this looks so much better this week we will delay that for now 3/5; the wound is small but about the same as last week. We have been using Hydrofera Blue. No debridement is required today. 3/19; the wound is about the size of a dime. Healthy looking wound even under illumination. We have been using Hydrofera Blue. No mechanical debridement is necessary 3/26; not much change from last week although still looks very healthy. We have been using Hydrofera Blue under Unna boots Patient was offered an ankle fusion by podiatry but not until the  wound heals with a proceed with this. 4/9; the patient comes in today with her original wound on the medial ankle looking satisfactory however she has some uncontrolled swelling in the middle part of her leg with 2 new open areas superiorly just lateral to the tibia. I think this was probably a wrap issue. She said she felt uncomfortable during the week but did not call in. We have been using Hydrofera Blue 4/16; the wound on the medial ankle is about the same. She has innumerable small areas superior to this across her mid tibia. I think this is probably folliculitis. She is also been working in the yard doing a lot of sweating 4/30; the patient issue on the upper areas across her mid tibia of all healed. I think this was excessive yard work if I remember. Her wound on the medial ankle is smaller. Some debris on this we have been using Hydrofera Blue under Unna boots 5/7; mid tibia. She has been using Hydrofera Blue under an Unna wrap. She is apparently going for her ankle surgery on June 3 10/28/19-Patient returns to clinic with the ankle wound, we are using Hydrofera Blue under Unna wrap, surgery is scheduled for her left foot for June 3 so she will be back for nurse visit next week READMISSION 01/17/2020 Mrs. Mayall is a 64 year old woman we have had in this clinic for a long period of time with severe venous hypertension and refractory wounds on her medial lower legs and ankles bilaterally. 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 When she was off her legs for a prolonged period example when she fell and suffered a compression fracture things would heal up quite nicely. She is now retired and we managed to heal up the right medial leg wound. The left one was very tiny last time I saw this although still refractory. She had an additional problem with inversion of her ankle  which was a complicated process largely a result of peripheral neuropathy. It got to the  point where this was interfering with her walking and she elected to proceed with a ankle arthrodesis to straighten her her ankle and leave her with a functional outcome for mobilization. The patient was referred to Dr. Doren Custard and really this took some time to arrange. Dr. Doren Custard saw her on 12/07/2019. Once again he verified that she had no arterial issues. She had previously had an angiogram several years ago. Follow-up ABIs on the left showed an ABI of 1.12 with triphasic waveforms and a TBI of 0.92. She is felt to have chronic deep venous insufficiency but I do not think it was felt that anything could be done from about this from an ablation point of view. At the time Dr. Doren Custard saw this patient the wounds actually look closed via the pictures in his clinic. The patient finally underwent her surgery on 12/15/2019. This went reasonably well and there was a good anatomic outcome. She developed a small distal wound dehiscence on the lateral part of the surgical wound. However more problematically she is developed recurrence of the wound on the medial left ankle. There are actually 2 wounds here one in the distal lower leg and 1 pretty much at the level of the medial malleolus. It is a more distal area that is more problematic. She has been using Hydrofera Blue which started on Friday before this she was simply Ace wrapping. There was a culture done that showed Pseudomonas and she is on ciprofloxacin. A recent CNS on 8/11 was negative. The patient reports some pain but I generally think this is improving. She is using a cam boot completely nonweightbearing using a walker for pivot transfers and a wheelchair 8/24; not much improvement unfortunately she has a surgical wound on the lateral part in the venous insufficiency wound medially. The bottom part of the medial insufficiency wound is still necrotic there is exposed  tendon here. We have been using Hydrofera Blue under compression. Her edema control is however better 8/31; patient in for follow-up of his surgical wound on the lateral part of her left leg and chronic venous insufficiency ulcers medially. We put her back in compression last week. She comes in today with a complaint of 3 or 4 days worth of increasing pain. She felt her cam walker was rubbing on the area on the back of her heel. However there is intense erythema seems more likely she has cellulitis. She had 2 cultures done when she was seeing podiatry in the postop. One of them in late July showed Pseudomonas and she received a course of ciprofloxacin the other was negative on 8/11 she is allergic to penicillin with anaphylactoid complaints of hives oral swelling via information in epic 9/9; when I saw this patient last week she had intense anterior erythema around her wound on the right lateral heel and ankle and also into the right medial heel. Some of this was no doubt drainage and her walker boot however I was convinced she had cellulitis. I gave her Levaquin and Bactrim she is finishing up on this now. She is following up with Dr. [name] he saw her yesterday. He is taken her out of the walking boot of course she is still nonweightbearing. Her x-ray was negative for any worrisome features such as soft tissue air etc. Things are a lot better this week. She has home health. We have been using Hydrofera Blue under an [employer] which she put back on yesterday. I did not wrap her last week 9/17; her surrounding  skin looks a lot better. In fact the area on the left lateral ankle has just a scant amount of eschar. The only remaining wound is the large area on the left medial ankle. Probably about 60% of this is healthy granulation at the surface however she has a significant divot distally. This has adherent debris in it. I been using debridement and silver collagen to try and get this area to fill-in  although I do not think we have made much progress this week 9/24; the patient's wound on the left medial ankle looks a lot better. The deeper divot area distally still requires debridement but this is cleaning up quite nicely we have been using silver collagen. The patient is complaining of swelling in her foot and is worried that that is contributing to the nonhealing of the ankle wound. She is also complaining of numbness in her anterior toes 10/4; left medial ankle. The small area distally still has a divot with necrotic material that I have been debriding away. This has an undermining area. She is approved for Apligraf. She saw Dr. [name] her surgeon on 10/1. I think he declared himself is satisfied with the condition of things. Still nonweightbearing till the end of the month. 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 Her surgical wound is well closed. There is no evidence of infection 10/11; the patient arrived in clinic today with the expectation that we be able to put an Apligraf on this area after debridement however she arrives with a relatively large amount of green drainage on the dressing. The patient states that this started on Friday. She has not been systemically unwell. 10/19; culture I did last week showed both Enterococcus and Pseudomonas. I think this came in separate parts because I stopped her ciprofloxacin I gave her and prescribed her linezolid however now looking at the final culture result this is Pseudomonas which is resistant to quinolones. She has not yet picked up the linezolid apparently phone issues. We are also trying to get a topical antibiotic out of [facility] in [place] they can be applied by home health. She is still having green drainage 10/16; the patient has her topical antibiotic from [facility] in [place]. This is a compounded gel with vancomycin and ciprofloxacin and gentamicin. We are applying this on the wound  bed with silver alginate over the top with Unna boot wraps. She arrives in clinic today with a lot less ominous looking drainage although she is only use this topical preparation once the second time today. She sees Dr. [name] her surgeon on Friday she has home health changing the dressing 11/2; still using her compounded topical antibiotic under silver alginate. Surface is cleaning up there is less drainage. We had an Apligraf for her today and I elected to apply it. A light coating of her antibiotic 04/25/2020 upon evaluation today patient appears to be doing well with regard to her ankle ulcer. There is a little bit of slough noted on the surface of the wound I am can have to perform sharp debridement to clear this away today. With that being said other than that fact overall I feel like she is making progress and we do see some new epithelial growth. There is also some improvement in the depth of the wound and that distal portion. There is little bit of slough there as well. 12/7; 2-week follow-up. Apligraf #3. Dimensions are smaller. Closing in especially inferiorly. Still some surface debris. Still using  the [facility] topical antibiotic but I told her that I don't think this needs to be renewed 12/21; 2-week follow-up. Apligraf #4 dimensions are smaller. Nice improvement 06/05/2020; 2-week follow-up. The patient's wound on the left medial ankle looks really excellent. Nice granulation. Advancing epithelialization no undermining no evidence of infection. We would have to reapply for another Apligraf but with the condition of this wound I did not feel strongly about it. We used Hydrofera Blue under the same degree of compression. She follows up with Dr. [name] her surgeon a week Friday 06/13/2020 upon evaluation today patient appears to be doing excellent in regard to her wound. She has been tolerating the dressing changes without complication. Fortunately there is no signs of active infection at this  time. No fevers, chills, nausea, vomiting, or diarrhea. She was using Hydrofera Blue last week. 06/20/2020 06/20/2020 on evaluation today patient actually appears to be doing excellent in regard to her wound. This is measuring better and looking much better as well. She has been using the collagen that seems to be doing better for her as well even though the [facility] was and is not sticking or feeling as rough on her wound. She did see Dr. [name] on Friday he is very pleased he also stated none of the hardware has shifted. That is great news 1/27; the patient has a small clean wound all that is remaining. I agree that this is too small to really consider an Apligraf. Under illumination the surface is looking quite good. We have been using collagen although the dimensions are not any better this week 2/2; the patient has a small clean wound on the left medial ankle. Although this left of her substantial original areas. Measurements are smaller. We have been using polymen Ag under an Haematologist. 2/10; small area on the left medial ankle. This looks clean nothing to debride however dimensions are about the same we have been using polymen I think now for 2 weeks 2/17; not much change in surface area. We have been using polymen Ag without any improvement. 3/17; 1 month follow-up. The patient has been using endoform without any improvement in fact I think this looks worse with more depth and more expansion 3/24; no improvement. Perhaps less debris on the surface. We have been using Sorbact for 1 week 4/4; wound measures larger. She has edema in her leg and her foot which she tells as her wrap came down. We have been using Unna boots. Sorbact of the wound. She has been approved for Apligraf 09/12/2020 upon evaluation today patient appears to be doing well with regard to her wound. We did get the Apligraf reapproved which is great news we have that available for application today. Fortunately there is no  signs of infection and overall the patient seems to be doing great. The wound bed is nice and clean. 4/27; patient presents for her second application of Apligraf. She states over the past week she has been on her feet more often due to being outside in her garden. She has noted more swelling to her foot as a result. She denies increased warmth, pain or erythema to the wound site. 10/10/2020 upon evaluation today patient appears to be doing well with regard to her wound which does not appear to be quite as irritated as last week from what I am hearing. With that being said unfortunately she is having issues with some erythema and warmth to touch as well as an increase size. I do believe  this likely is infected. 10/17/2020 upon evaluation today patient appears to be doing excellent in regard to her wound this is significantly improved as compared to last week. Fortunately I think that the infection is much better controlled at this point. She did have evidence of both Enterococcus as well as Staphylococcus noted on culture. Enterococcus really would not be helped significantly by the Cipro but the wound is doing so much better I am under the assumption that the Staphylococcus is probably the main organism that is causing the current infection. Nonetheless I think that she is doing excellent as far as that is concerned and I am very pleased in that regard. I would therefore recommend she continue with the Cipro. 10/31/2020 upon evaluation today patient appears to be doing well with regard to her wound. She has been tolerating the dressing changes without complication. Fortunately there is no signs of active infection and overall I am extremely pleased with where things stand today. No fevers, chills, nausea, vomiting, or diarrhea. With that being said she does have some green drainage coming from the wound and although it looks okay I am a little concerned about the possibility of a continuing infection.  Specifically with Pseudomonas. For that reason I will go ahead and send in a prescription for Cipro for her to be continued. 11/14/2020 upon evaluation today patient appears to be doing very well currently in regard to her wound on her leg. She has been tolerating the dressing changes without complication. Fortunately I feel like the infection is finally under good control here. Unfortunately we do not have the Apligraf for application today although we can definitely order to have it in place for next week. That will be her fifth and final of the current series. Nonetheless I feel like her wound is really doing quite well which is great news. 11/21/2020 upon evaluation today patient appears to be doing well with regard to her wound on the medial ankle. Fortunately I think the infection is under control and I do believe we can go ahead and reapply the Apligraf today. She is in agreement with that plan. There does not appear to be any signs of active infection at this time which is great news. No fevers, chills, nausea, vomiting, or diarrhea. 12/05/2020 upon evaluation today patient's wound bed actually showed signs of good granulation epithelization at this point. There does not appear to be any signs of infection which is great news and overall very pleased with where things stand. Overall the patient seems to be doing fairly well in my opinion with regard to her wound although I do believe she continues to build up a lot of biofilm I think she could benefit from using PuraPly at this point. 12/12/2020 upon evaluation today patient's wound actually appears to be doing decently well today. The Unna boot has not been quite as well-tolerated so that more uncomfortable for her and even causing some pressure over the plate on the lateral portion of her foot which is 90 where the wound is. There did not appear to be any significant deep tissue injury with that there may be a minimal change in the skin noted I  think that we may want to go back to the Coflex 2 layer which is a little bit easier on her skin it seems. 12/19/2020 upon evaluation today patient actually seems to be making great progress with the PuraPly currently. She in fact seems to be much better as far as the overall appearance of  the wound bed is concerned I am very happy in this regard. I do not see any signs of of infection which is great news as well. No fevers, chills, nausea, vomiting, or diarrhea. 12/26/2020 upon evaluation today patient appears to actually be doing better in regard to her wound on the left medial ankle region. The surface of the wound is actually doing significantly better which is great news. There does not appear to be any signs of infection which is also great news and in general I am extremely pleased with where we stand today. 01/02/2021 upon evaluation today patient appears to be doing well with regard to her wound. In fact this is showing signs of excellent improvement and very pleased with where things stand. In fact the last 3 appointments have all shown signs of this getting smaller which is excellent news. I have not even had to perform any debridement and today is no exception. Overall I feel like this is dramatically improved compared to previous. T oday is PuraPly application #4. [id number] upon evaluation today patient appears to be doing excellent in regard to her wound this is continue to show signs of improvement and overall I am extremely pleased with where we stand today. She is actually here for PuraPly application #5. Every time we have applied this she is noted definite improvement on measurements. 01/23/2021 upon evaluation today patient is actually making good progress in regard to her wound. This was actually on just a little bit longer this time compared to previous due to the fact that she did have to go out of town. She is actually here for PuraPly application #6. We have definitely been seeing  improvements in the overall quality of the tissue on the surface of the wound which is awesome news. In general I think that the patient seems to be continuing to make great progress here. Electronic Signature(s) Signed: 01/23/2021 1:41:20 PM By: [name] PA-C Entered By: [name] on 01/23/2021 13:41:20 -------------------------------------------------------------------------------- Physical Exam Details Patient Name: Date of Service: [facility] [name], [name] 01/23/2021 10:45 A M Medical Record Number: [id number] Patient Account Number: [id number] Date of Birth/Sex: Treating RN: April 30, 1956 (65 y.o. [name] Provider: [name] Other Clinician: Referring Provider: Treating Provider/Extender: [name] in Treatment: 4 Constitutional Well-nourished and well-hydrated in no acute distress. Respiratory normal breathing without difficulty. Psychiatric this patient is able to make decisions and demonstrates good insight into disease process. Alert and Oriented x 3. pleasant and cooperative. Notes Upon inspection patient's wound bed showed signs of good granulation epithelization at this point. Fortunately there does not appear to be any signs of active infection which is great news and overall I am extremely pleased with where things stand today. I did have to perform some sharp debridement in preparation for reapplying the PuraPly today. She tolerated the debridement today without complication and postdebridement the wound bed appears to be doing much better which is great news. Electronic Signature(s) Signed: 01/23/2021 1:41:45 PM By: [name] PA-C Entered By: [name] on 01/23/2021 13:41:45 -------------------------------------------------------------------------------- Physician Orders Details Patient Name: Date of Service: [facility] [name], [name] 01/23/2021 10:45 A M Medical Record Number: [id number] Patient  Account Number: [id number] Date of Birth/Sex: Treating RN: 1955-07-20 (65 y.o. [name] Provider: [name] Other Clinician: Referring Provider: Treating Provider/Extender: [name] in Treatment: 5 Verbal / Phone Orders: No Diagnosis Coding ICD-10  Coding Code Description I87.332 Chronic venous hypertension (idiopathic) with ulcer and inflammation of left lower extremity L97.828 Non-pressure chronic ulcer of other part of left lower leg with other specified severity L97.328 Non-pressure chronic ulcer of left ankle with other specified severity Follow-up Appointments ppointment in 1 week. - puraply AM Return A Cellular or Tissue Based Products Cellular or Tissue Based Product Type: - PuraPly: #6 daptic or Mepitel. (DO NOT REMOVE). - no Cellular or Tissue Based Product applied to wound bed, secured with steri-strips, cover with A steristrips Bathing/ Shower/ Hygiene May shower with protection but do not get wound dressing(s) wet. Edema Control - Lymphedema / SCD / Other Elevate legs to the level of the heart or above for 30 minutes daily and/or when sitting, a frequency of: - throughout the day Avoid standing for long periods of time. Exercise regularly Compression stocking or Garment 20-30 mm/Hg pressure to: - right leg daily Additional Orders / Instructions Follow Nutritious Diet Wound Treatment Wound #15 - Malleolus Wound Laterality: Left, Medial Cleanser: Soap and Water 1 x Per Week/30 Days Discharge Instructions: May shower and wash wound with dial antibacterial soap and water prior to dressing change. Peri-Wound Care: Sween Lotion (Moisturizing lotion) 1 x Per Week/30 Days Discharge Instructions: Apply moisturizing lotion as directed Prim Dressing: PuraPly AM ary 1 x Per Week/30 Days Secondary Dressing: Woven Gauze Sponge, Non-Sterile 4x4 in 1 x Per Week/30 Days Discharge Instructions: Apply over primary dressing as  directed. Secondary Dressing: ABD Pad, 5x9 1 x Per Week/30 Days Discharge Instructions: Apply over primary dressing and lateral ankle Secondary Dressing: Optifoam Non-Adhesive Dressing, 4x4 in 1 x Per Week/30 Days Discharge Instructions: Apply over primary dressing pad lateral foot with foam donut Secondary Dressing: ADAPTIC TOUCH 3x4.25 in 1 x Per Week/30 Days Discharge Instructions: Apply over skin sub Compression Wrap: CoFlex TLC XL 2-layer Compression System 4x7 (in/yd) 1 x Per Week/30 Days Discharge Instructions: Apply CoFlex 2-layer compression as directed. (alt for 4 layer) Electronic Signature(s) Signed: 01/23/2021 5:35:19 PM By: Baruch Gouty RN, BSN Signed: 01/23/2021 6:37:42 PM By: [name] PA-C Entered By: Baruch Gouty on 01/23/2021 11:29:07 -------------------------------------------------------------------------------- Problem List Details Patient Name: Date of Service: [name], [name] 01/23/2021 10:45 A M Medical Record Number: [id number] Patient Account Number: [id number] Date of Birth/Sex: Treating RN: 1956-04-20 (64 y.o. [name] Provider: [name] Other Clinician: Referring Provider: Treating Provider/Extender: [name] in Treatment: 26 Active Problems ICD-10 Encounter Code Description Active Date MDM Diagnosis I87.332 Chronic venous hypertension (idiopathic) with ulcer and inflammation of left 01/17/2020 No Yes lower extremity L97.828 Non-pressure chronic ulcer of other part of left lower leg with other specified 01/17/2020 No Yes severity L97.328 Non-pressure chronic ulcer of left ankle with other specified severity 01/17/2020 No Yes Inactive Problems ICD-10 Code Description Active Date Inactive Date L03.116 Cellulitis of left lower limb 01/31/2020 01/31/2020 T81.31XD Disruption of external operation (surgical) wound, not elsewhere classified, subsequent 01/17/2020  01/17/2020 encounter Resolved Problems Electronic Signature(s) Signed: 01/23/2021 11:13:13 AM By: [name] PA-C Entered By: [name] on 01/23/2021 11:13:13 -------------------------------------------------------------------------------- Progress Note Details Patient Name: Date of Service: [facility] [name], [name] 01/23/2021 10:45 A M Medical Record Number: [id number] Patient Account Number: [id number] Date of Birth/Sex: Treating RN: 1956/01/11 (65 y.o. [name] Provider: [name] Other Clinician: Referring Provider: Treating Provider/Extender: [name] in Treatment: 14 Subjective Chief Complaint Information obtained from Patient patient  is been followed long-term in this clinic for venous insufficiency ulcers with inflammation, hypertension and ulceration over the medial ankle bilaterally. 01/17/2020; this is a patient who is here for review of postoperative wounds on the left lateral ankle and recurrence of venous stasis ulceration on the left medial History of Present Illness (HPI) the remaining wound is over the left medial ankle. Similar wound over the right medial ankle healed largely with use of Apligraf. Most recently we have been using Hydrofera Blue over this wound with considerable improvement. The patient has been extensively worked up in the past for her venous insufficiency and she is not a candidate for antireflux surgery although I have none of the details available currently. 08/24/14; considerable improvement today. About 50% of this wound areas now epithelialized. The base of the wound appears to be healthier granulation.as opposed to last week when she had deteriorated a considerable improvement 08/17/14; unfortunately the wound has regressed somewhat. The areas of epithelialization from the superior aspect are not nearly as healthy as they were last week. The patient thinks her Hydrofera Blue  slipped. 09/07/14; unfortunately the area has markedly regressed in the 2 weeks since I've seen this. There is an odor surrounding erythema. The healthy granulation tissue that we had at the base of the wound now is a dusky color. The nurse reports green drainage 09/14/14; the area looks somewhat better than last week. There is less erythema and less drainage. The culture I did did not show any growth. Nevertheless I think it is better to continue the Cipro and doxycycline for a further week. The remaining wound area was debridement. 09/21/14. Wound did not require debridement last week. Still less erythema and less drainage. She can complete her antibiotics. The areas of epithelialization in the superior aspect of the wound do not look as healthy as they did some weeks ago 10/05/14 continued improvement in the condition of this wound. There is advancing epithelialization. Less aggressive debridement required 10/19/14 continued improvement in the condition and volume of this wound. Less aggressive debridement to the inferior part of this to remove surface slough and fibrinous eschar 11/02/14 no debridement is required. The surface granulation appears healthy although some of her islands of epithelialization seem to have regressed. No evidence of infection 11/16/14; lites surface debridement done of surface eschar. The wound does not look to be unhealthy. No evidence of infection. Unfortunately the patient has had podiatry issues in the right foot and for some reason has redeveloped small surface ulcerations in the medial right ankle. Her original presentation involved wounds in this area 11/23/14 no debridement. The area on the right ankle has enlarged. The left ankle wound appears stable in terms of the surface although there is periwound inflammation. There has been regression in the amount of new skin 11/30/14 no debridement. Both wound areas appear healthy. There was no evidence of infection. The the new  area on the right medial ankle has enlarged although that both the surfaces appear to be stable. 12/07/14; Debridement of the right medial ankle wound. No no debridement was done on the left. 12/14/14 no major change in and now bilateral medial ankle wounds. Both of these are very painful but the no overt evidence of infection. She has had previous venous ablation 12/21/14; patient states that her right medial ankle wound is considerably more painful last week than usual. Her left is also somewhat painful. She could not tolerate debridement. The right medial ankle wound has fibrinous surface eschar 12/28/14 this is  a patient with severe bilateral venous insufficiency ulcers. For a considerable period of time we actually had the one on the right medial ankle healed however this recently opened up again in June. The left medial ankle wound has been a refractory area with some absent flows. We had some success with Hydrofera Blue on this area and it literally closed by 50% however it is recently opened up Foley. Both of these were debridement today of surface eschar. She tolerates this poorly 01/25/15: No change in the status of this. Thick adherent escar. Very poor tolerance of any attempt at debridement. I had healed the right medial malleolus wound for a considerable amount of time and had the left one down to about 50% of the volume although this is totally regressed over the last 48 weeks. Further the right leg has reopened. she is trying to make a appointment with pain and vascular, previous ablations with Dr. Aleda Grana. I do not believe there is an arterial insufficiency issue here 02/01/15 the status of the adherent eschar bilaterally is actually improved. No debridement was done. She did not manage to get vascular studies done 02/08/15 continued debridement of the area was done today. The slough is less adherent and comes off with less pressure. There is no surrounding infection peripheral pulses are  intact 02/15/15 selective debridement with a disposable curette. Again the slough is less adherent and comes off with less difficulty. No surrounding infection peripheral pulses are intact. 02/22/15 selective debridement of the right medial ankle wound. Slough comes off with less difficulty. No obvious surrounding infection peripheral pulses are intact I did not debridement the one on the left. Both of these are stable to improved 03/01/15 selective debridement of both wound areas using a curette to. Adherent slough cup soft with less difficulty. No obvious surrounding infection. The patient tells me that 2 days ago she noted a rash above the right leg wrap. She did not have this on her lower legs when she change this over she arrives with widespread left greater than right almost folliculitis-looking rash which is extremely pruritic. I don't see anything to culture here. There is no rash on the rest of her body. She feels well systemically. 03/08/15; selective debridement of both wounds using a curette. Base of this does not look unhealthy. She had limegreen drainage coming out of the left leg wound and describes a lot of drainage. The rash on her left leg looks improved to. No cultures were done. 03/22/15; patient was not here last week. Basal wounds does not look healthy and there is no surrounding erythema. No drainage. There is still a rash on the left leg that almost looks vasculitic however it is clearly limited to the top of where the wrap would be. 04/05/15; on the right required a surgical debridement of surface eschar and necrotic subcutaneous tissue. I did not debridement the area on the left. These continue to be large open wounds that are not changing that much. We were successful at one point in healing the area on the right, and at the same time the area on the left was roughly half the size of current measurements. I think a lot of the deterioration has to do with the prolonged time the  patient is on her feet at work 04/19/15 I attempted-like surface debridement bilaterally she does not tolerate this. She tells me that she was in allergic care yesterday with extreme pain over her left lateral malleolus/ankle and was told that she has an "  sprain" 05/03/15; large bilateral venous insufficiency wounds over the medial malleolus/medial aspect of her ankles. She complains of copious amounts of drainage and his usual large amounts of pain. There is some increasing erythema around the wound on the right extending into the medial aspect of her foot to. historically she came in with these wounds the right one healed and the left one came down to roughly half its current size however the right one is reopened and the left is expanded. 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 05/10/15 large bilateral venous insufficiency wounds. There is less adherence surface left however the surface culture that I did last week grew pseudomonas therefore bilateral selective debridement score necessary. There is surrounding erythema. The patient describes severe bilateral drainage and a lot of pain in the left ankle. Apparently her podiatrist was were ready to do a cortisone shot 05/17/15; the patient complains of pain and again copious amounts of drainage. 05/24/15; we used Iodo flex last week. Patient notes considerable improvement in wound drainage. Only needed to change this once. 05/31/15; we continued Iodoflex; the base of these large wounds bilaterally is not too bad but there is probably likely a significant bioburden here. I would like to debridement just doesn't tolerate it. 06/06/14 I would like to continue the Iodoflex although she still hasn't managed to obtain supplies. She has bilateral medial malleoli or large wounds which are mostly superficial. Both of them are covered circumferentially with some nonviable fibrinous slough although she  tolerates debridement very poorly. She apparently has an appointment for an ablation on the right leg by interventional radiology. 06/14/15; the patient arrives with the wounds and static condition. We attempted a debridement although she does not do well with this secondary to pain. I 07/05/15; wounds are not much smaller however there appears to be a cleaner granulating base. The left has tight fibrinous slough greater than the right. Debridement is tolerated poorly due to pain. Iodoflex is done more for these wounds in any of the multitude of different dressings I have tried on the left 1 and then subsequently the right. 07/12/15; no change in the condition of this wound. I am able to do an aggressive debridement on the right but not the left. She simply cannot tolerate it. We have been using Iodoflex which helps somewhat. It is worthwhile remembering that at one point we healed the right medial ankle wound and the left was about 25% of the current circumference. We have suggested returning to vascular surgery for review of possible further ablations for one reason or another she has not been able to do this. 07/26/15 no major change in the condition of either wound on her medial ankle. I did not attempt to debridement of these. She has been aggressively scrubbing these while she is in the shower at home. She has her supply of Iodoflex which seems to have done more for these wounds then anything I have put on recently. 08/09/15 wound area appears larger although not verified by measurements. Using Iodoflex 09/05/2015 -- she was here for avisit today but had significant problems with the wound and I was asked to see her for a physician opinion. I have summarize that this lady has had surgery on her left lower extremity about 10 years ago where the possible veins stripping was done. She has had an opinion from interventional radiology around November 2016 where no further sclerotherapy was ordered.  The  patient works 12 hours a day and stands on a concrete floor with work boots and is unable to get the proper compression she requires and cannot elevate her limbs appropriately at any given time. She has recently grown Pseudomonas from her wound culture but has not started her ciprofloxacin which was called in for her. 09/13/15 this continues to be a difficult situation for this patient. At one point I had this wound down to a 1.5 x 1.5" wound on her left leg. This is deteriorated and the right leg has reopened. She now has substantial wounds on her medial calcaneus, malleoli and into her lower leg. One on the left has surface eschar but these are far too painful for me to debridement here. She has a vascular surgery appointment next week to see if anything can be done to help here. I think she has had previous ablations several years ago at [facility]. She has no major edema. She tells me that she did not get product last time [facility] and went for several days without it. She continues to work in work boots 12 hours a day. She cannot get compression/4-layer under her work boots. 09/20/15 no major change. Periwound edema control was not very good. Her point with pain and vascular is next Wednesday the 25th 09/28/15; the patient is seen vascular surgery and is apparently scheduled for repeat duplex ultrasounds of her bilateral lower legs next week. 10/05/15; the patient was seen by Dr. Doren Custard of vascular surgery. He feels that she should have arterial insufficiency excluded as cause/contributed to her nonhealing stage she is therefore booked for an arteriogram. She has apparently monophasic signals in the dorsalis pedis pulses. She also of course has known severe chronic venous insufficiency with previous procedures as noted previously. I had another long discussion with the patient today about her continuing to work 12 hour shifts. I've written her out for 2 months area had concerns about this as her  work location is currently undergoing significant turmoil and this may lead to her termination. She is aware of this however I agree with her that she simply cannot continue to stand for 12 hours multiple days a week with the substantial wound areas she has. 10/19/15; the Dr. Doren Custard appointment was largely for an arteriogram which was normal. She does not have an arterial issue. He didn't make a comment about her chronic venous insufficiency for which she has had previous ablations. Presumably it was not felt that anything additional could be done. The patient is now out of work as I prescribed 2 weeks ago. Her wounds look somewhat less aggravated presumably because of this. I felt I would give debridement another try today 10/25/15; no major change in this patient's wounds. We are struggling to get her product that she can afford into her own home through her insurance. 11/01/15; no major change in the patient's wounds. I have been using silver alginate as the most affordable product. I spoke to Dr. Marla Roe last week with her requested take her to the OR for surgical debridement and placement of ACEL. Dr. Marla Roe told me that she would be willing to do this however [facility] will not cover this, fortunately the patient has [place] healthcare of some variant 11/08/15; no major change in the patient's wounds. She has been completely nonviable surface that this but is in too much pain with any attempted debridement are clinic. I have arranged for her to see Dr. Marla Roe ham of  plastic surgery and this appointment is on Monday. I am hopeful that they will take her to the OR for debridement, possible ACEL ultimately possible skin graft 11/22/15 no major change in the patient's wounds over her bilateral medial calcaneus medial malleolus into the lower legs. Surface on these does not look too bad however on the left there is surrounding erythema and tenderness. This may be cellulitis or could him  sleepy tinea. 11/29/15; no major changes in the patient's wounds over her bilateral medial malleolus. There is no infection here and I don't think any additional antibiotics are necessary. There is now plan to move forward. She sees Dr. Marla Roe in a week's time for preparation for operative debridement and ACEL placement I believe on 7/12. She then has a follow-up appointment with Dr. Marla Roe on 7/21 12/28/15; the patient returns today having been taken to the [facility] by Dr. Marla Roe 12/12/15 she underwent debridement, intraoperative cultures [which were negative]. She had placement of a wound VAC. Parent really ACEL was not available to be placed. The wound VAC foam apparently adhered to the wound since then she's been using silver alginate, Xeroform under Ace wraps. She still says there is a lot of drainage and a lot of pain 01/31/16; this is a patient I see monthly. I had referred her to Dr. Marla Roe him of plastic surgery for large wounds on her bilateral medial ankles. She has been to the OR twice once in early July and once in early August. She tells me over the last 3 weeks she has been using the wound VAC with ACEL underneath it. On the right we've simply been using silver alginate. Under Kerlix Coban wraps. 02/28/16; this is a patient I'm currently seeing monthly. She is gone on to have a skin graft over her large venous insufficiency ulcer on the left medial ankle. This was done by Dr. Marla Roe him. The patient is a bit perturbed about why she didn't have one on her right medial ankle wound. She has been using silver alginate to this. 03/06/16; I received a phone call from her plastic surgery Dr. Marla Roe. She expressed some concern about the viability of the skin graft she did on the left medial ankle wound. Asked me to place Endoform on this. She told me she is not planning to do a subsequent skin graft on the right as the left one did not take very well. I had placed Hydrofera Blue on  the right 03/13/16; continue to have a reasonably healthy wound on the right medial ankle. Down to 3 mm in terms of size. There is epithelialization here. The area on the left medial ankle is her skin graft site. I suppose the last week this looks somewhat better. She has an open area inferiorly however in the center there appears to be some viable tissue. There is a lot of surface callus and eschar that will eventually need to come off however none of this looked to be infected. Patient states that the is able to keep the dressing on for several days which is an improvement. 03/20/16 no major change in the circumference of either wound however on the left side the patient was at Dr. Eusebio Friendly office and they did a debridement of left wound. 50% of the wound seems to be epithelialized. I been using Endoform on the left Hydrofera Blue in the right 03/27/16; she arrives today with her wound is not looking as healthy as they did last week. The area on the right clearly has  an adherent surface to this a very similar surface on the left. Unfortunately for this patient this is all too familiar problem. Clearly the Endoform is not working and will need to change that today that has some potential to help this surface. She does not tolerate debridement in this clinic very well. She is changing the dressing wants 04/03/16; patient arrives with the wounds looking somewhat better especially on the right. Dr. Migdalia Dk change the dressing to silver alginate when she saw her on Monday and also sold her some compression socks. The usefulness of the latter is really not clear and woman with severely draining wounds. 04/10/16; the patient is doing a bit of an experiment wearing the compression stockings that Dr. Migdalia Dk provided her to her left leg and the out of legs based dressings that we provided to the right. 05/01/16; the patient is continuing to wear compression stockings Dr. Migdalia Dk provided her on the left that  are apparently silver impregnated. She has been using Iodoflex to the right leg wound. Still a moderate amount of drainage, when she leaves here the wraps only last for 4 days. She has to change the stocking on the left leg every night 05/15/16; she is now using compression stockings bilaterally provided by Dr. Marla Roe. She is wearing a nonadherent layer over the wounds so really I don't think there is anything specific being done to this now. She has some reduction on the left wound. The right is stable. I think all healing here is being done without a specific dressing 06/09/16; patient arrives here today with not much change in the wound certainly in diameter to large circular wounds over the medial aspect of her ankle bilaterally. Under the light of these services are certainly not viable for healing. There is no evidence of surrounding infection. She is wearing compression stockings with some sort of silver impregnation as prescribed by Dr. Marla Roe. She has a follow-up with her tomorrow. 06/30/16; no major change in the size or condition of her wounds. These are still probably covered with a nonviable surface. She is using only her purchase stockings. She did see Dr. Marla Roe who seemed to want to apply Dakin's solution to this I'm not extreme short what value this would be. I would suggest Iodoflex which she still has at home. 07/28/16; I follow Mrs. Minck episodically along with Dr. Marla Roe. 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 She has been applying a topical collagen ointment to both wounds with Adaptic. I don't think Dr. Marla Roe is planning to take her back to the OR. 08/19/16; I follow Mrs. Jeneen Rinks on a monthly basis along with Dr. Marla Roe of plastic surgery. 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 I been following her for a number of years.  At one point I was able to get the right medial malleolus wound to heal and had the left medial malleolus down to about half its current size however and I had to send her to plastic surgery for an operative debridement. Since then things have been stable to slightly improve the area on the right is slightly better one in the left about the same although there is much less adherent surface than I'm used to with this patient. She is using some form of liquid collagen gel that Dr. Marla Roe provided a Kerlix cover with the patient's own pressure stockings. She tells me that she has extreme  pain in both ankles and along the lateral aspect of both feet. She has been unable to work for some period of time. She is telling me she is retiring at the beginning of April. She sees Dr. Doran Durand of orthopedics next week 09/22/16; patient has not seen Dr. Marla Roe since the last time she is here. I'm not really sure what she is using to the wounds other than bits and pieces of think she had left over including most recently Hydrofera Blue. She is using juxtalite stockings. She is having difficulty with her husband's recent illness "stroke". She is having to transport him to various doctors appointments. Dr. Marla Roe left her the option of a repeat debridement with ACEL however she has not been able to get the time to follow-up on this. She continues to have a fair amount of drainage out of these wounds with certainly precludes leaving dressings on all week 10/13/16; patient has not seen Dr. Marla Roe since she was last in our clinic. I'm not really sure what she is doing with the wounds, we did try to get her [facility] and I think she is actually using this most of the time. Because of drainage she states she has to change this every second day although this is an improvement from what she used to do. She went to see Dr. Doran Durand who did not think she had a muscular issue with regards to her feet, he referred  her to a neurologist and I think the appointment is sometime in June. I changed her back to Iodoflex which she has used in the past but not recently. 11/03/16; the patient has been using Iodoflex although she ran out of this. Still claims that there is a lot of drainage although the wound does not look like this. No surrounding erythema. She has not been back to see Dr. Marla Roe 11/24/16; the patient has been using Iodoflex again but she ran out of it 2 or 3 days ago. There is no major change in the condition of either one of these wounds in fact they are larger and covered in a thick adherent surface slough/nonviable tissue especially on the left. She does not tolerate mechanical debridement in our clinic. Going back to see Dr. Marla Roe of plastic surgery for an operative debridement would seem reasonable. 12/15/16; the patient has not been back to see Dr. Marla Roe. She is been dealing with a series of illnesses and her husband which of monopolized her time. She is been using Sorbact which we largely supplied. She states the drainage is bad enough that it maximum she can go 2-3 days without changing the dressing 01/12/2017 -- the patient has not been back for about 4 weeks and has not seen Dr. Marla Roe not does she have any appointment pending. 01/23/17; patient has not seen Dr. Marla Roe even though I suggested this previously. She is using Santyl that was suggested last week by Dr. Con Memos this Cost her $16 through her insurance which is indeed surprising 02/12/17; continuing Santyl and the patient is changing this daily. A lot of drainage. She has not been back to see plastic surgery she is using an Ace wrap. Our intake nurse suggested wrap around stockings which would make a good reasonable alternative 02/26/17; patient is been using Santyl and changing this daily due to drainage. She has not been to see plastic surgery she uses in April Ace wrap to control the edema. She did obtain extremitease  stockings but stated that the edema in her leg  was to big for these 03/20/17; patient is using Santyl and Anasept. Surfaces looked better today the area on the right is actually measuring a little smaller. She has states she has a lot of pain in her feet and ankles and is asking for a consult to pain control which I'll try to help her with through our case manager. 04/10/17; the patient arrives with better-looking wound surfaces and is slightly smaller wound on the left she is using a combination of Santyl and Anasept. She has an appointment or at least as started in the pain control center associated with [facility] 05/14/17; this is a patient who I followed for a prolonged period of time. She has venous insufficiency ulcers on her bilateral medial ankles. At one point I had this down to a much smaller wound on the left however these reopened and we've never been able to get these to heal. She has been using Santyl and Anasept gel although 2 weeks ago she ran out of the Anasept gel. She has a stable appearance of the wound. She is going to the wound care clinic at [facility]. They wanted do a nerve block/spinal block although she tells me she is reluctant to go forward with that. 05/21/17; this is a patient I have followed for many years. She has venous insufficiency ulcers on her bilateral medial ankles. Chronic pain and deformity in her ankles as well. She is been to see plastic surgery as well as orthopedics. Using PolyMem AG most recently/Kerramax/ABDs and 2 layer compression. She has managed to keep this on and she is coming in for a nurse check to change the dressing on Tuesdays, we see her on Fridays 06/05/17; really quite a good looking surface and the area especially on the right medial has contracted in terms of dimensions. Well granulated healthy-looking tissue on both sides. Even with an open curet there is nothing that even feels abnormal here. This is as good as I've seen this  in quite some time. We have been using PolyMem AG and bringing her in for a nurse check 06/12/17; really quite good surface on both of these wounds. The right medial has contracted a bit left is not. We've been using PolyMem and AG and she is coming in for a nurse visit 06/19/17; we have been using PolyMem AG and bringing her in for a nurse check. Dimensions of her wounds are not better but the surfaces looked better bilaterally. She complained of bleeding last night and the left wound and increasing pain bilaterally. She states her wound pain is more neuropathic than just the wounds. There was some suggestion that this was radicular from her pain management doctor in talking to her it is really difficult to sort this out. 06/26/17; using PolyMem and AG and bringing her in for a nurse check as All of this and reasonably stable condition. Certainly not improved. The dimensions on the lateral part of the right leg look better but not really measuring better. The medial aspect on the left is about the same. 07/03/16; we have been using PolyMen AG and bringing her in for a nurse check to change the dressings as the wounds have drainage which precludes once weekly changing. We are using all secondary absorptive dressings.our intake nurse is brought up the idea of using a wound VAC/snap VAC on the wound to help with the drainage to see if this would result in some contraction. This is not a bad idea. The area on the right  medial is actually looking smaller. Both wounds have a reasonable-looking surface. There is no evidence of cellulitis. The edema is well controlled 07/10/17; the patient was denied for a snap VAC by her insurance. The major issue with these wounds continues to be drainage. We are using wicked PolyMem AG and she is coming in for a nurse visit to change this. The wounds are stable to slightly improved. The surface looks vibrant and the area on the right certainly has shrunk in size but very  slowly 07/17/17; the patient still has large wounds on her bilateral medial malleoli. Surface of both of these wounds looks better. The dimensions seem to come and go but no consistent improvement. There is no epithelialization. We do not have options for advanced treatment products due to insurance issues. They did not approve of the wound VAC to help control the drainage. More recently we've been using PolyMem and AG wicked to allow drainage through. We have been bringing her in for a nurse visit to change this. We do not have a lot of options for wound care products and the home again due to insurance issues 07/24/17; the patient's wound actually looks somewhat better today. No drainage measurements are smaller still healthy-looking surface. We used silver collagen under PolyMen started last week. We have been bringing her in for a dressing change 07/31/17; patient's wound surface continued to look better and I think there is visible change in the dimensions of the wound on the right. Rims of epithelialization. We have been using silver collagen under PolyMen and bringing her in for a dressing change. There appears to be less drainage although she is still in need of the dressing change 08/07/17. Patient's wound surface continues to look better on both sides and the area on the right is definitely smaller. We have been using silver collagen and PolyMen. She feels that the drainage has been it has been better. I asked her about her vascular status. She went to see Dr. Aleda Grana at [facility] and had some form of ablation. I don't have much detail on this. I haven't my notes from 2016 that she was not a candidate for any further ablation but I don't have any more information on this. We had referred her to vein and vascular I don't think she ever went. He does not have a history of PAD although I don't have any information on this either. We don't even have ABIs in our record 08/14/17; we've been  using silver collagen and PolyMen cover. And putting the patient and compression. She we are bringing her in as a nurse visit to change this because ofarge amount of drainage. We didn't the ABIs in clinic today since they had been done in many moons 1.2 bilaterally. She has been to see vein and vascular however this was at [facility] and she had ablation although I really don't have any information on this all seemed biking get a report. She is also been operatively debrided by plastic surgery and had a cell placed probably 8-12 months ago. This didn't have a major effect. We've been making some gains with current dressings 08/19/17-She is here in follow-up evaluation for bilateral medial malleoli ulcers. She continues to tolerate debridement very poorly. We will continue with recently changed topical treatment; if no significant improvement may consider switching to Iodosorb/Iodoflex. 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 She has been using silver collagen and PolyMem. I believe she has been used  and tried on Iodoflex before. 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 These have been very refractory over time. We have been using silver collagen and PolyMen. She is coming in once a week for a doctor's and once a week for nurse visits. We are actually making some progress 09/18/17; the patient's wounds are smaller especially on the right medial. She arrives today to upset to consider even washing these off with Anasept which I think is been part of the reason this is been closing. We've been using collagen covered in PolyMen otherwise. It is noted that she has a small area of folliculitis on the right medial calf that. As we are wrapping her legs I'll give her  a short course of doxycycline to make sure this doesn't amount to anything. She is a long list of complaints today including imbalance, shortness of breath on exertion, inversion of her left ankle. With regards to the latter complaints she is been to see orthopedics and they offered her a tendon release surgery I believe but wanted her wounds to be closed first. I have recommended she go see her primary physician with regards to everything else. 09/25/17; patient's wounds are about the same size. We have made some progress bilaterally although not in recent weeks. She will not allow me T wash these wounds with Anasept even if she is doing her cell. Wheeze we've been using collagen covered in PolyMen. Last week she had a small area of folliculitis this is now opened into a small wound. She completed 5 days of trimethoprim sulfamethoxazole 10/02/17; unfortunately the area on her left medial ankle is worse with a larger wound area towards the Achilles. The patient complains of a lot of pain. She will not allow debridement although visually I don't think there is anything to debridement in any case. We have been using silver collagen and PolyMen for several months now. Initially we are making some progress although I'm not really seeing that today. We will move back to [employer]. His admittedly this is a bit of a repeat however I'm hoping that his situation is different now. The patient tells me she had her leg on the left give out on her yesterday this is process some pain. 10/09/17; the patient is seen twice a week largely because of drainage issues coming out of the chronic medial bimalleolar wounds that are chronic. Last week the dimensions of the one on the left looks a little larger I changed her to [facility]. She comes in today with a history of terrible pain in the bilateral wound areas. She will not allow debridement. She will not even allow a tissue culture. There is no surrounding erythema  no no evidence of cellulitis. We have been putting her Kerlix Coban man. She will not allow more aggressive compression as there was a suggestion to put her in 3 layer wraps. 10/16/17; large wounds on her bilateral medial malleoli. These are chronic. Not much change from last week. The surface looks have healthy but absolutely no epithelialization. A lot of pain little less so of drainage. She will not allow debridement or even washing these off in the vigorous fashion with Anasept. 10/23/17; large wounds on her bilateral malleoli which are chronic. Some improvement in terms of size perhaps on the right since last time  I saw these. She states that after we increased the 3 layer compression there was some bleeding, when she came in for a nurse visit she did not want 3 layer compression put back on about our nurse managed to convince her. She has known chronic venous visit issues and I'm hoping to get her to tolerate the 3 layer compression. using Hydrofera Blue 10/30/17; absolutely no change in the condition of either wound although we've had some improvement in dimensions on the right.. Attempted to put her in 3 layer compression she didn't tolerated she is back in 2 layer compression. We've been using Hydrofera Blue We looked over her past records. She had venous reflux studies in November 2016. There was no evidence of deep venous reflux on the right. Superficial vein did not show the greater saphenous vein at think this is been previously ablated the small saphenous vein was within normal limits. The left deep venous system showed no DVT the vessels were positive for deep venous reflux in the posterior tibial veins at the ankle. The greater saphenous vein was surgically absent small saphenous vein was within normal limits. She went to vein and vascular at [facility]. I believe she had an ablation on the left greater saphenous vein. I'll update her reflux studies perhaps ever reviewed by vein and  vascular. We've made absolutely no progress in these wounds. Will also try to read and TheraSkins through her insurance 11/06/17; W the patient apparently has a 2 week follow-up with vein and vascular I like him to review the whole issue with regards to her previous vascular workup by Dr. Aleda Grana. We've really made no progress on these wounds in many months. She arrives today with less viable looking surface on the left medial ankle wound. This was apparently looking about the same on Tuesday when she was here for nurse visit. 11/13/17; deep tissue culture I did last time of the left lower leg showed multiple organisms without any predominating. In particular no Staphylococcus or group A strep were isolated. We sent her for venous reflux studies. She's had a previous left greater saphenous vein stripping and I think sclerotherapy of the right greater saphenous vein. She didn't really look at the lesser saphenous vein this both wounds are on the medial aspect. She has reflux in the common femoral vein and popliteal vein and an accessory vein on the right and the common femoral vein and popliteal vein on the left. I'm going to have her go to see vein and vascular just the look over things and see if anything else beside aggressive compression is indicated here. We have not been able to make any progress on these wounds in spite of the fact that the surface of the wounds is never look too bad. 11/20/17; no major change in the condition of the wounds. Patient reports a large amount of drainage. She has a lot of complaints of pain although enlisting her today I wonder if some of this at least his neuropathic rather than secondary to her wounds. She has an appointment with vein and vascular on 12/30/17. The refractory nature of these wounds in my mind at least need vein and vascular to look over the wounds the recent reflux studies we did and her history to see if anything further can be done here. I also  note her gait is deteriorated quite a bit. 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 She has a bilateral Trendelenburg gait. I wonder if this  is neuropathic or perhaps multilevel radicular. 11/27/17; her wounds actually looks slightly better. Healthy-looking granulation tissue a scant amount of epithelialization. [place] [facility] will not pay for [employer]. They will play for tri layer Oasis and Dermagraft. This is not a diabetic ulcer. We'll try for the tri layer Oasis. She still complains of some drainage. She has a vein and vascular appointment on 12/30/17 12/04/17; the wounds visually look quite good. Healthy-looking granulation with some degree of epithelialization. We are still waiting for response to our request for trial to try layer Oasis. Her appointment with vascular to review venous and arterial issues isn't sold the end of July 7/31. Not allow debridement or even vigorous cleansing of the wound surface. 12/18/17; slightly smaller especially on the right. 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 We've been using Hydrofera Blue. We still are looking into triple layer Oasis through her insurance 01/08/18 on evaluation today patient's wound actually appears to be showing signs of good improvement at this point in time. She has been tolerating the dressing changes without complication. Fortunately there does not appear to be any evidence of infection at this point in time. We have been utilizing silver nitrate which does seem to be of benefit for her which is also good news. Overall I'm very happy with how things seem to be both regards appearance as well as measurement. Patient did see Dr. Bridgett Larsson for evaluation on 12/30/17. In his assessment he felt that stripping would not likely add much more than chronic compression to the patient's healing process. His recommendation was to follow-up in three months with Dr. Doren Custard if she hasn't healed in order to consider  referral back to you and see vascular where she previously was in a trial and was able to get her wound to heal. I'll be see what she feels she when you staying compression and he reiterated this as well. 01/13/18 on evaluation today patient appears to actually be doing very well in regard to her bilateral medial malleolus ulcers. She seems to have tolerated the chemical cauterization with silver nitrate last week she did have some pain through that evening but fortunately states that I'll be see since it seems to be doing better she is overall pleased with the progress. 01/21/18; really quite a remarkable improvement since I've last seen these wounds. We started using silver nitrate specially on the islands of hyper granulation which for some reason her around the wound circumference. This is really done quite nicely. Primary dressing Hydrofera Blue under 4 layer compression. She seems to be able to hold out without a nurse rewrap. Follow-up in 1 week 01/28/18; we've continued the hydrofera blue but continued with chemical cauterization to the wound area that we started about a month ago for irregular hyper granulation. She is made almost stunning improvement in the overall wound dimensions. I was not really expecting this degree of improvement in these chronic wounds 02/05/18; we continue with Hydrofera Bluebut of also continued the aggressive chemical cauterization with silver nitrate. We made nice progress with the right greater than left wound. 02/12/18. We continued with Hydrofera Blue after aggressive chemical cauterization with silver nitrate. We appear to be making nice progress with both wound areas 02/19/2018; we continue with [facility] after washing the wounds vigorously with Anasept spray and chemical cauterization with silver nitrate. We are making excellent progress. The area on the right's just about closed 02/26/2018. The area on the left medial ankle had too much necrotic debris  today. I  used a #5 curette we are able to get most of the soft. I continued with the silver nitrate to the much smaller wound on the right medial ankle she had a new area on her right lower pretibial area which she says was due to a role in her compression 03/05/2018; both wound areas look healthy. Not much change in dimensions from last week. I continue to use silver nitrate and Hydrofera Blue. The patient saw Dr. Doren Custard of vein and vascular. He felt she had venous stasis ulcers. He felt based on her previous arteriogram she should have adequate circulation for healing. Also she has deep venous reflux but really no significant correctable superficial venous reflux at this time. He felt we should continue with conservative management including leg elevation and compression 04/02/2018; since we last saw this woman about a month ago she had a fall apparently suffered a pelvic fracture. I did not look up the x-ray. Nevertheless because of pain she literally was bedbound for 2 weeks and had home health coming out to change the dressing. Somewhat predictably this is resulted in considerable improvement in both wound areas. The right is just about closed on the medial malleolus and the left is about half the size. 04/16/2018; both her wounds continue to go down in size. Using Hydrofera Blue. 05/07/18; both her wounds appeared to be improving especially on the right where it is almost closed. We are using Hydrofera Blue 05/14/2018; slightly worse this week with larger wounds. Surface on the left medial not quite as good. We have been using Hydrofera Blue 05/21/18; again the wounds are slightly larger. Left medial malleolus slightly larger with eschar around the circumference. We have been using Hydrofera Blue undergoing a wraps for a prolonged period of time. This got a lot better when she was more recumbent due to a fall and a back injury. I change the primary dressing the silver alginate today. She did not  tolerate a 4 layer compression previously although I may need to bring this up with her next time 05/28/2018; area on the left medial malleolus again is slightly larger with more drainage. Area on the right is roughly unchanged. She has a small area of folliculitis on the right medial just on the lower calf. This does not look ominous. 06/03/2018 left medial malleolus slightly smaller in a better looking surface. We used silver nitrate on this last time with silver alginate. The area on the right appears slightly smaller 1/10; left medial malleolus slightly smaller. Small open area on the right. We used silver nitrate and silver alginate as of 2 weeks ago. We continue with the wound and compression. These got a lot better when she was off her feet 1/17; right medial malleolus wound is smaller. The left may be slightly smaller. Both surfaces look somewhat better. 1/24; both wounds are slightly smaller. Using silver alginate under Unna boots 1/31; both wounds appear smaller in fact the area on the right medial is just about closed. Surface eschar. We have been using silver alginate under Unna boots. The patient is less active now spends let much less time on her feet and I think this is contributed to the general improvement in the wound condition 2/7; both wounds appear smaller. I was hopeful the right medial would be closed however there there is still the same small open area. Slight amount of surface eschar on the left the dimensions are smaller there is eschar but the wound edges appear to be free. We have  been using silver alginate under Unna boot's 2/14; both wounds once again measure smaller. Circumferential eschar on the left medial. We have been using silver alginate under Unna boots with gradual improvement 2/21; the area on the right medial malleolus has healed. The area on the left is smaller. We have been using silver alginate and Unna boots. We can discharge wrapping the right leg she has  20/30 stockings at home she will need to protect the scar tissue in this area 2/28; the area on the right medial malleolus remains closed the patient has a compression stocking. The area on the left is smaller. We have been using silver alginate and Unna boots. 3/6 the area on the right medial ankle remains closed. Good edema control noted she is using her own compression stocking. The area on the left medial ankle is smaller. We have been managing this with silver alginate and Unna boots which we will continue today. 3/13; the area on the right medial ankle remains closed and I'm declaring it healed today. When necessary the left is about the same still a healthy-looking surface but no major change and wound area. No evidence of infection and using silver alginate under unna and generally making considerable improvement 3/27 the area on the right medial ankle remains closed the area on the left is about the same as last week. Certainly not any worse we have been using silver alginate under an Unna boot 4/3; the area on the right medial ankle remains closed per the patient. We did not look at this wound. The wound on the left medial ankle is about the same surface looks healthy we have been using silver alginate under an Unna boot 4/10; area on the right medial ankle remains closed per the patient. We did not look at this wound. The wound on the left medial ankle is slightly larger. The patient complains that the [facility] caused burning pain all week. She also told us that she was a lot more active this week. Changed her back to silver alginate 4/17; right medial ankle still closed per the patient. Left medial ankle is slightly larger. Using silver alginate. She did not tolerate Hydrofera Blue on this area 4/24; right medial ankle remains closed we have not look at this. The left medial ankle continues to get larger today by about a centimeter. We have been using silver alginate under Unna  boots. She complains about 4 layer compression as an alternative. She has been up on her feet working on her garden 5/8; right medial ankle remains closed we did not look at this. The left medial ankle has increased in size about 100%. We have been using silver alginate under Unna boots. She noted increased pain this week and was not surprised that the wound is deteriorated 5/15; no major change in SA however much less erythema ( one week of doxy ocellulitis). 5/22-65 year old female returns at 1 week to the clinic for left medial ankle wound for which we have been using silver alginate under 3 layer compression She was placed on DOXY at last visit - the wound is wider at this visit. She is in 3 layer compression 5/29; change to [facility] last week. I had given her empiric doxycycline 2 weeks ago for a week. She is in 3 layer compression. She complains of a lot of pain and drainage on presentation today. 6/5; using Hydrofera Blue. I gave her doxycycline recently empirically for erythema and pain around the wound. Believe her cultures  showed enterococcus which not would not have been well covered by doxycycline nevertheless the wound looks better and I don't feel specifically that the enterococcus needs to be covered. She has a new what looks like a wrap injury on her lateral left ankle. 6/12; she is using Hydrofera Blue. She has a new area on the left anterior lower tibial area. This was a wrap injury last week. 6/19; the patient is using Hydrofera Blue. She arrived with marked inflammation and erythema around the wound and tenderness. 12/01/18 on evaluation today patient appears to be doing a little bit better based on what I'm hearing from the standpoint of lassos evaluation to this as far as the overall appearance of the wound is concerned. Then sometime substandard she typically sees Dr. Dellia Nims. Nonetheless overall very pleased with the progress that she's made up to this point. No fevers,  chills, nausea, or vomiting noted at this time. 7/10; some improvement in the surface area. Aggressively debrided last week apparently. I went ahead with the debridement today although the patient does not tolerate this very well. We have been using Iodoflex. Still a fair amount of drainage 7/17; slightly smaller. Using Iodoflex. 7/24; no change from last week in terms of surface area. We have been using Iodoflex. Surface looks and continues to look somewhat better 7/31; surface area slightly smaller better looking surface. We have been using Iodoflex. This is under Unna boot compression 8/7-Patient presents at 1 week with Unna boot and Iodoflex, wound appears better 8/14-Patient presents at 1 week with Iodoflex, we use the Unna boot, wound appears to be stable better.Patient is getting Botox treatment for the inversion of the foot for tendon release, Next week 8/21; we are using Iodoflex. Unna boot. The wound is stable in terms of surface area. Under illumination there is some areas of the wound that appear to be either epithelialized or perhaps this is adherent slough at this point I was not really clear. It did not wipe off and I was reluctant to debride this today. 8/28; we are using Iodoflex in an Unna boot. Seems to be making good improvement. 9/4; using Iodoflex and wound is slightly smaller. 9/18; we are using Iodoflex with topical silver nitrate when she is here. The wound continues to be smaller 10/2; patient missed her appointment last week due to GI issues. She left and Iodoflex based dressing on for 2 weeks. Wound is about the same size about the size of a dime on the left medial lower 10/9 we have been using Iodoflex on the medial left ankle wound. She has a new superficial probable wrap injury on the dorsal left ankle 10/16; we have been using Hydrofera Blue since last week. This is on the left medial ankle 10/23; we have been using Hydrofera Blue since 2 weeks ago. This is on the  left medial ankle. Dimensions are better 11/6; using Hydrofera Blue. I think the wound is smaller but still not closed. Left medial ankle 11/13; we have been using Hydrofera Blue. Wound is certainly no smaller this week. Also the surface not as good. This is the remanent of a very large area on her left medial ankle. 11/20; using Sorbact since last week. Wound was about the same in terms of size although I was disappointed about the surface debris 12/11; 3-week follow-up. Patient was on vacation. Wound is measuring slightly larger we have been using Sorbact. 12/18; wound is about the same size however surface looks better last week after debridement. We  have been using Sorbact under compression 1/15 wound is probably twice the size of last time increased in length nonviable surface. We have been using Sorbact. She was running a mild fever and missed her appointment last week 1/22; the wound is come down in size but under illumination still a very adherent debris we have been Hydrofera Blue that I changed her to last week 1/29; dimensions down slightly. We have been using Hydrofera Blue 2/19 dimensions are the same however there is rims of epithelialization under illumination. Therefore more the surface area may be epithelialized 2/26; the patient's wound actually measures smaller. The wound looks healthy. We have been using Hydrofera Blue. I had some thoughts about running Apligraf then I still may do that however this looks so much better this week we will delay that for now 3/5; the wound is small but about the same as last week. We have been using Hydrofera Blue. No debridement is required today. 3/19; the wound is about the size of a dime. Healthy looking wound even under illumination. We have been using Hydrofera Blue. No mechanical debridement is necessary 3/26; not much change from last week although still looks very healthy. We have been using Hydrofera Blue under Unna boots Patient was  offered an ankle fusion by podiatry but not until the wound heals with a proceed with this. 4/9; the patient comes in today with her original wound on the medial ankle looking satisfactory however she has some uncontrolled swelling in the middle part of her leg with 2 new open areas superiorly just lateral to the tibia. I think this was probably a wrap issue. She said she felt uncomfortable during the week but did not call in. We have been using Hydrofera Blue 4/16; the wound on the medial ankle is about the same. She has innumerable small areas superior to this across her mid tibia. I think this is probably folliculitis. She is also been working in the yard doing a lot of sweating 4/30; the patient issue on the upper areas across her mid tibia of all healed. I think this was excessive yard work if I remember. Her wound on the medial ankle is smaller. Some debris on this we have been using Hydrofera Blue under Unna boots 5/7; mid tibia. She has been using Hydrofera Blue under an Unna wrap. She is apparently going for her ankle surgery on June 3 10/28/19-Patient returns to clinic with the ankle wound, we are using Hydrofera Blue under Unna wrap, surgery is scheduled for her left foot for June 3 so she will be back for nurse visit next week READMISSION 01/17/2020 Mrs. Nuxoll is a 65 year old woman we have had in this clinic for a long period of time with severe venous hypertension and refractory wounds on her medial lower legs and ankles bilaterally. 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 When she was off her legs for a prolonged period example when she fell and suffered a compression fracture things would heal up quite nicely. She is now retired and we managed to heal up the right medial leg wound. The left one was very tiny last time I saw this although still refractory. 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 It got to the point where this was interfering with her  walking and she elected to proceed with a ankle arthrodesis to straighten her her ankle and leave her with a functional outcome for mobilization. The patient was referred to Dr. Doren Custard and really this took some time to arrange. Dr. Doren Custard saw her on 12/07/2019. Once again he verified that she had no arterial issues. She had previously had an angiogram several years ago. Follow-up ABIs on the left showed an ABI of 1.12 with triphasic waveforms and a TBI of 0.92. She is felt to have chronic deep venous insufficiency but I do not think it was felt that anything could be done from about this from an ablation point of view. At the time Dr. Doren Custard saw this patient the wounds actually look closed via the pictures in his clinic. The patient finally underwent her surgery on 12/15/2019. This went reasonably well and there was a good anatomic outcome. She developed a small distal wound dehiscence on the lateral part of the surgical wound. However more problematically she is developed recurrence of the wound on the medial left ankle. There are actually 2 wounds here one in the distal lower leg and 1 pretty much at the level of the medial malleolus. It is a more distal area that is more problematic. She has been using Hydrofera Blue which started on Friday before this she was simply Ace wrapping. There was a culture done that showed Pseudomonas and she is on ciprofloxacin. A recent CNS on 8/11 was negative. The patient reports some pain but I generally think this is improving. She is using a cam boot completely nonweightbearing using a walker for pivot transfers and a wheelchair 8/24; not much improvement unfortunately she has a surgical wound on the lateral part in the venous insufficiency wound medially. The bottom part of the medial  insufficiency wound is still necrotic there is exposed tendon here. We have been using Hydrofera Blue under compression. Her edema control is however better 8/31; patient in for follow-up of his surgical wound on the lateral part of her left leg and chronic venous insufficiency ulcers medially. We put her back in compression last week. She comes in today with a complaint of 3 or 4 days worth of increasing pain. She felt her cam walker was rubbing on the area on the back of her heel. However there is intense erythema seems more likely she has cellulitis. She had 2 cultures done when she was seeing podiatry in the postop. One of them in late July showed Pseudomonas and she received a course of ciprofloxacin the other was negative on 8/11 she is allergic to penicillin with anaphylactoid complaints of hives oral swelling via information in epic 9/9; when I saw this patient last week she had intense anterior erythema around her wound on the right lateral heel and ankle and also into the right medial heel. Some of this was no doubt drainage and her walker boot however I was convinced she had cellulitis. I gave her Levaquin and Bactrim she is finishing up on this now. She is following up with Dr. [name] he saw her yesterday. He is taken her out of the walking boot of course she is still nonweightbearing. Her x-ray was negative for any worrisome features such as soft tissue air etc. Things are a lot better this week. She has home health. We have been using Hydrofera Blue under an [employer] which she put back on yesterday. I did not wrap her last week 9/17; her surrounding skin looks a lot better. In fact  the area on the left lateral ankle has just a scant amount of eschar. The only remaining wound is the large area on the left medial ankle. Probably about 60% of this is healthy granulation at the surface however she has a significant divot distally. This has adherent debris in it. I been using debridement and  silver collagen to try and get this area to fill-in although I do not think we have made much progress this week 9/24; the patient's wound on the left medial ankle looks a lot better. The deeper divot area distally still requires debridement but this is cleaning up quite nicely we have been using silver collagen. The patient is complaining of swelling in her foot and is worried that that is contributing to the nonhealing of the ankle wound. She is also complaining of numbness in her anterior toes 10/4; left medial ankle. The small area distally still has a divot with necrotic material that I have been debriding away. This has an undermining area. She is approved for Apligraf. She saw Dr. [name] her surgeon on 10/1. I think he declared himself is satisfied with the condition of things. Still nonweightbearing till the end of the month. 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 Her surgical wound is well closed. There is no evidence of infection 10/11; the patient arrived in clinic today with the expectation that we be able to put an Apligraf on this area after debridement however she arrives with a relatively large amount of green drainage on the dressing. The patient states that this started on Friday. She has not been systemically unwell. 10/19; culture I did last week showed both Enterococcus and Pseudomonas. I think this came in separate parts because I stopped her ciprofloxacin I gave her and prescribed her linezolid however now looking at the final culture result this is Pseudomonas which is resistant to quinolones. She has not yet picked up the linezolid apparently phone issues. We are also trying to get a topical antibiotic out of [facility] in [place] they can be applied by home health. She is still having green drainage 10/16; the patient has her topical antibiotic from [facility] in [place]. This is a compounded gel with vancomycin and ciprofloxacin  and gentamicin. We are applying this on the wound bed with silver alginate over the top with Unna boot wraps. She arrives in clinic today with a lot less ominous looking drainage although she is only use this topical preparation once the second time today. She sees Dr. [name] her surgeon on Friday she has home health changing the dressing 11/2; still using her compounded topical antibiotic under silver alginate. Surface is cleaning up there is less drainage. We had an Apligraf for her today and I elected to apply it. A light coating of her antibiotic 04/25/2020 upon evaluation today patient appears to be doing well with regard to her ankle ulcer. There is a little bit of slough noted on the surface of the wound I am can have to perform sharp debridement to clear this away today. With that being said other than that fact overall I feel like she is making progress and we do see some new epithelial growth. There is also some improvement in the depth of the wound and that distal portion. There is little bit of slough there as well. 12/7; 2-week follow-up. Apligraf #3. Dimensions are smaller. Closing in especially inferiorly. Still some surface debris. Still using the [facility] topical antibiotic but I told  her that I don't think this needs to be renewed 12/21; 2-week follow-up. Apligraf #4 dimensions are smaller. Nice improvement 06/05/2020; 2-week follow-up. The patient's wound on the left medial ankle looks really excellent. Nice granulation. Advancing epithelialization no undermining no evidence of infection. We would have to reapply for another Apligraf but with the condition of this wound I did not feel strongly about it. We used Hydrofera Blue under the same degree of compression. She follows up with Dr. [name] her surgeon a week Friday 06/13/2020 upon evaluation today patient appears to be doing excellent in regard to her wound. She has been tolerating the dressing changes without complication.  Fortunately there is no signs of active infection at this time. No fevers, chills, nausea, vomiting, or diarrhea. She was using Hydrofera Blue last week. 06/20/2020 06/20/2020 on evaluation today patient actually appears to be doing excellent in regard to her wound. This is measuring better and looking much better as well. She has been using the collagen that seems to be doing better for her as well even though the [facility] was and is not sticking or feeling as rough on her wound. She did see Dr. [name] on Friday he is very pleased he also stated none of the hardware has shifted. That is great news 1/27; the patient has a small clean wound all that is remaining. I agree that this is too small to really consider an Apligraf. Under illumination the surface is looking quite good. We have been using collagen although the dimensions are not any better this week 2/2; the patient has a small clean wound on the left medial ankle. Although this left of her substantial original areas. Measurements are smaller. We have been using polymen Ag under an Haematologist. 2/10; small area on the left medial ankle. This looks clean nothing to debride however dimensions are about the same we have been using polymen I think now for 2 weeks 2/17; not much change in surface area. We have been using polymen Ag without any improvement. 3/17; 1 month follow-up. The patient has been using endoform without any improvement in fact I think this looks worse with more depth and more expansion 3/24; no improvement. Perhaps less debris on the surface. We have been using Sorbact for 1 week 4/4; wound measures larger. She has edema in her leg and her foot which she tells as her wrap came down. We have been using Unna boots. Sorbact of the wound. She has been approved for Apligraf 09/12/2020 upon evaluation today patient appears to be doing well with regard to her wound. We did get the Apligraf reapproved which is great news we have  that available for application today. Fortunately there is no signs of infection and overall the patient seems to be doing great. The wound bed is nice and clean. 4/27; patient presents for her second application of Apligraf. She states over the past week she has been on her feet more often due to being outside in her garden. She has noted more swelling to her foot as a result. She denies increased warmth, pain or erythema to the wound site. 10/10/2020 upon evaluation today patient appears to be doing well with regard to her wound which does not appear to be quite as irritated as last week from what I am hearing. With that being said unfortunately she is having issues with some erythema and warmth to touch as well as an increase size. I do believe this likely is infected. 10/17/2020 upon evaluation  today patient appears to be doing excellent in regard to her wound this is significantly improved as compared to last week. Fortunately I think that the infection is much better controlled at this point. She did have evidence of both Enterococcus as well as Staphylococcus noted on culture. Enterococcus really would not be helped significantly by the Cipro but the wound is doing so much better I am under the assumption that the Staphylococcus is probably the main organism that is causing the current infection. Nonetheless I think that she is doing excellent as far as that is concerned and I am very pleased in that regard. I would therefore recommend she continue with the Cipro. 10/31/2020 upon evaluation today patient appears to be doing well with regard to her wound. She has been tolerating the dressing changes without complication. Fortunately there is no signs of active infection and overall I am extremely pleased with where things stand today. No fevers, chills, nausea, vomiting, or diarrhea. With that being said she does have some green drainage coming from the wound and although it looks okay I am a little  concerned about the possibility of a continuing infection. Specifically with Pseudomonas. For that reason I will go ahead and send in a prescription for Cipro for her to be continued. 11/14/2020 upon evaluation today patient appears to be doing very well currently in regard to her wound on her leg. She has been tolerating the dressing changes without complication. Fortunately I feel like the infection is finally under good control here. Unfortunately we do not have the Apligraf for application today although we can definitely order to have it in place for next week. That will be her fifth and final of the current series. Nonetheless I feel like her wound is really doing quite well which is great news. 11/21/2020 upon evaluation today patient appears to be doing well with regard to her wound on the medial ankle. Fortunately I think the infection is under control and I do believe we can go ahead and reapply the Apligraf today. She is in agreement with that plan. There does not appear to be any signs of active infection at this time which is great news. No fevers, chills, nausea, vomiting, or diarrhea. 12/05/2020 upon evaluation today patient's wound bed actually showed signs of good granulation epithelization at this point. There does not appear to be any signs of infection which is great news and overall very pleased with where things stand. Overall the patient seems to be doing fairly well in my opinion with regard to her wound although I do believe she continues to build up a lot of biofilm I think she could benefit from using PuraPly at this point. 12/12/2020 upon evaluation today patient's wound actually appears to be doing decently well today. The Unna boot has not been quite as well-tolerated so that more uncomfortable for her and even causing some pressure over the plate on the lateral portion of her foot which is 90 where the wound is. There did not appear to be any significant deep tissue injury  with that there may be a minimal change in the skin noted I think that we may want to go back to the Coflex 2 layer which is a little bit easier on her skin it seems. 12/19/2020 upon evaluation today patient actually seems to be making great progress with the PuraPly currently. She in fact seems to be much better as far as the overall appearance of the wound bed is concerned I am  very happy in this regard. I do not see any signs of of infection which is great news as well. No fevers, chills, nausea, vomiting, or diarrhea. 12/26/2020 upon evaluation today patient appears to actually be doing better in regard to her wound on the left medial ankle region. The surface of the wound is actually doing significantly better which is great news. There does not appear to be any signs of infection which is also great news and in general I am extremely pleased with where we stand today. 01/02/2021 upon evaluation today patient appears to be doing well with regard to her wound. In fact this is showing signs of excellent improvement and very pleased with where things stand. In fact the last 3 appointments have all shown signs of this getting smaller which is excellent news. I have not even had to perform any debridement and today is no exception. Overall I feel like this is dramatically improved compared to previous. T oday is PuraPly application #4. [id number] upon evaluation today patient appears to be doing excellent in regard to her wound this is continue to show signs of improvement and overall I am extremely pleased with where we stand today. She is actually here for PuraPly application #5. Every time we have applied this she is noted definite improvement on measurements. 01/23/2021 upon evaluation today patient is actually making good progress in regard to her wound. This was actually on just a little bit longer this time compared to previous due to the fact that she did have to go out of town. She is actually here  for PuraPly application #6. We have definitely been seeing improvements in the overall quality of the tissue on the surface of the wound which is awesome news. In general I think that the patient seems to be continuing to make great progress here. Objective Constitutional Well-nourished and well-hydrated in no acute distress. Vitals Time Taken: 10:49 AM, Height: 68 in, Temperature: 98.2 F, Pulse: 60 bpm, Respiratory Rate: 18 breaths/min, Blood Pressure: 135/69 mmHg. Respiratory normal breathing without difficulty. Psychiatric this patient is able to make decisions and demonstrates good insight into disease process. Alert and Oriented x 3. pleasant and cooperative. General Notes: Upon inspection patient's wound bed showed signs of good granulation epithelization at this point. Fortunately there does not appear to be any signs of active infection which is great news and overall I am extremely pleased with where things stand today. I did have to perform some sharp debridement in preparation for reapplying the PuraPly today. She tolerated the debridement today without complication and postdebridement the wound bed appears to be doing much better which is great news. Integumentary (Hair, Skin) Wound #15 status is Open. Original cause of wound was Gradually Appeared. The date acquired was: 12/30/2019. The wound has been in treatment 53 weeks. The wound is located on the Left,Medial Malleolus. The wound measures 3.4cm length x 2.4cm width x 0.1cm depth; 6.409cm^2 area and 0.641cm^3 volume. There is Fat Layer (Subcutaneous Tissue) exposed. There is no tunneling or undermining noted. There is a medium amount of serosanguineous drainage noted. The wound margin is flat and intact. There is large (67-100%) red granulation within the wound bed. There is a small (1-33%) amount of necrotic tissue within the wound bed including Adherent Slough. 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 There was a Excisional Skin/Subcutaneous Tissue Debridement with a total area of 8.16 sq cm performed by [name], PA. With the following instrument(s): Curette to remove Viable and Non-Viable tissue/material. Material removed includes Subcutaneous Tissue and Slough and after achieving pain control using Lidocaine 5% topical ointment. No specimens were taken. A time out was conducted at 11:15, prior to the start of the procedure. A Minimum amount of bleeding was controlled with Pressure. The procedure was tolerated well with a pain level of 3 throughout and a pain level of 1 following the procedure. Post Debridement Measurements: 3.4cm length x 2.4cm width x 0.1cm depth; 0.641cm^3 volume. Character of Wound/Ulcer Post Debridement is improved. Severity of Tissue Post Debridement is: Fat layer exposed. Post procedure Diagnosis Wound #15: Same as Pre-Procedure Pre-procedure diagnosis of Wound #15 is a Venous Leg Ulcer located on the Left,Medial Malleolus. A skin graft procedure using a bioengineered skin substitute/cellular or tissue based product was performed by [name], PA with the following instrument(s): N/A. Puraply AM was applied and was not secured. 12 sq cm of product was utilized and 0 sq cm was wasted. Post Application, adaptic, gauze was applied. A Time Out was conducted at 11:22, prior to the start of the procedure. The procedure was tolerated well with a pain level of 0 throughout and a pain level of 0 following the procedure. Post procedure Diagnosis Wound #15: Same as  Pre-Procedure . Pre-procedure diagnosis of Wound #15 is a Venous Leg Ulcer located on the Left,Medial Malleolus . There was a Double Layer Compression Therapy Procedure by Baruch Gouty, RN. Post procedure Diagnosis Wound #15: Same as Pre-Procedure Plan Follow-up Appointments: Return Appointment in 1 week. - puraply AM Cellular or Tissue Based Products: Cellular or Tissue Based Product Type: - PuraPly: #6 Cellular or Tissue Based Product applied to wound bed, secured with steri-strips, cover with Adaptic or Mepitel. (DO NOT REMOVE). - no steristrips Bathing/ Shower/ Hygiene: May shower with protection but do not get wound dressing(s) wet. Edema Control - Lymphedema / SCD / Other: Elevate legs to the level of the heart or above for 30 minutes daily and/or when sitting, a frequency of: - throughout the day Avoid standing for long periods of time. Exercise regularly Compression stocking or Garment 20-30 mm/Hg pressure to: - right leg daily Additional Orders / Instructions: Follow Nutritious Diet WOUND #15: - Malleolus Wound Laterality: Left, Medial Cleanser: Soap and Water 1 x Per Week/30 Days Discharge Instructions: May shower and wash wound with dial antibacterial soap and water prior to dressing change. Peri-Wound Care: Sween Lotion (Moisturizing lotion) 1 x Per Week/30 Days Discharge Instructions: Apply moisturizing lotion as directed Prim Dressing: PuraPly AM 1 x Per Week/30 Days ary Secondary Dressing: Woven Gauze Sponge, Non-Sterile 4x4 in 1 x Per Week/30 Days Discharge Instructions: Apply over primary dressing as directed. Secondary Dressing: ABD Pad, 5x9 1 x Per Week/30 Days Discharge Instructions: Apply over primary dressing and lateral ankle Secondary Dressing: Optifoam Non-Adhesive Dressing, 4x4 in 1 x Per Week/30 Days Discharge Instructions: Apply over primary dressing pad lateral foot with foam donut Secondary Dressing: ADAPTIC TOUCH 3x4.25 in 1 x Per Week/30  Days Discharge Instructions: Apply over skin sub Com pression Wrap: CoFlex TLC XL 2-layer Compression System 4x7 (in/yd) 1 x Per Week/30 Days Discharge Instructions: Apply CoFlex 2-layer compression  as directed. (alt for 4 layer) 1. Would recommend currently that we go ahead and continue with the wound care measures as before and the patient is in agreement with the plan. This includes the use of the PuraPly and I did apply #6 today to the wound bed. She tolerated that application without complication this was not secured with Steri- Strips due to irritation of her skin I used a little Desitin around and then put the Adaptic over top. 2. I am also can recommend that we continue with the Coflex 2 layer compression wrap at 30 to 40 mmHg range. We will see patient back for reevaluation in 1 week here in the clinic. If anything worsens or changes patient will contact our office for additional recommendations. Electronic Signature(s) Signed: 01/23/2021 1:42:24 PM By: [name] PA-C Entered By: [name] on 01/23/2021 13:42:24 -------------------------------------------------------------------------------- SuperBill Details Patient Name: Date of Service: [facility] [name], [name] 01/23/2021 Medical Record Number: [id number] Patient Account Number: [id number] Date of Birth/Sex: Treating RN: [birth year] (65 y.o. [name] Provider: [name] Other Clinician: Referring Provider: Treating Provider/Extender: [name] in Treatment: 46 Diagnosis Coding ICD-10 Codes Code Description [phone] Chronic venous hypertension (idiopathic) with ulcer and inflammation of left lower extremity L97.828 Non-pressure chronic ulcer of other part of left lower leg with other specified severity L97.328 Non-pressure chronic ulcer of left ankle with other specified severity Facility Procedures CPT4 Code: [id number] Description: [id number] PuraPly AM 3X4 (12sq.  cm) Modifier: Quantity: 12 CPT4 Code: [id number] Description: [id number] - SKIN SUB GRAFT TRNK/ARM/LEG ICD-10 Diagnosis Description L97.828 Non-pressure chronic ulcer of other part of left lower leg with other specified s Modifier: everity Quantity: 1 Physician Procedures : CPT4 Code Description Modifier [id number] - WC PHYS SKIN SUB GRAFT TRNK/ARM/LEG ICD-10 Diagnosis Description L97.828 Non-pressure chronic ulcer of other part of left lower leg with other specified severity Quantity: 1 Electronic Signature(s) Signed: 01/23/2021 1:42:39 PM By: [name] PA-C Signed: 01/23/2021 1:42:39 PM By: [name] PA-C Entered By: [name] on 01/23/2021 13:42:39

## 2021-01-24 NOTE — Progress Notes (Signed)
[name], BIRCHER ([id number]) Visit Report for 01/23/2021 Arrival Information Details Patient Name: Date of Service: [facility], [name] 01/23/2021 10:45 A M Medical Record Number: [id number] Patient Account Number: [id number] Date of Birth/Sex: Treating RN: 11/25/1955 (65 y.o. [name] Nik Gorrell: [name] Other Clinician: Referring Jaquavis Felmlee: Treating Farah Lepak/Extender: [name] in Treatment: 17 Visit Information History Since Last Visit Added or deleted any medications: No Patient Arrived: Cane Any new allergies or adverse reactions: No Arrival Time: 10:48 Had a fall or experienced change in No Accompanied By: self activities of daily living that may affect Transfer Assistance: None risk of falls: Patient Identification Verified: Yes Signs or symptoms of abuse/neglect since last visito No Secondary Verification Process Completed: Yes Hospitalized since last visit: No Patient Requires Transmission-Based Precautions: No Implantable device outside of the clinic excluding No Patient Has Alerts: Yes cellular tissue based products placed in the center Patient Alerts: L ABI =1.12, TBI = .92 since last visit: R ABI= 1.02, TBI= .58 Has Dressing in Place as Prescribed: Yes Pain Present Now: Yes Electronic Signature(s) Signed: 01/24/2021 7:44:17 AM By: [name] Entered By: [name] on 01/23/2021 10:49:05 -------------------------------------------------------------------------------- Compression Therapy Details Patient Name: Date of Service: [facility] [name], Burnis [name] [name]. 01/23/2021 10:45 A M Medical Record Number: [id number] Patient Account Number: [id number] Date of Birth/Sex: Treating RN: 1956/02/09 (65 y.o. [name] [name]: [name] Other Clinician: Referring Marqus Macphee: Treating Christhoper Busbee/Extender: [name] in Treatment: 45 Compression Therapy Performed for Wound  Assessment: Wound #15 Left,Medial Malleolus Performed By: Clinician Baruch Gouty, RN Compression Type: Double Layer Post Procedure Diagnosis Same as Pre-procedure Electronic Signature(s) Signed: 01/23/2021 5:35:19 PM By: Baruch Gouty RN, BSN Entered By: Baruch Gouty on 01/23/2021 11:23:17 -------------------------------------------------------------------------------- Lower Extremity Assessment Details Patient Name: Date of Service: [name] [name], Burnis [name] [name]. 01/23/2021 10:45 A M Medical Record Number: [id number] Patient Account Number: [id number] Date of Birth/Sex: Treating RN: 05/31/1956 (65 y.o. [name] [name]: [name] Other Clinician: Referring Ayriel Texidor: Treating Ivann Trimarco/Extender: [name] in Treatment: 41 Edema Assessment Assessed: [Left: No] [Right: No] Edema: [Left: Ye] [Right: s] Calf Left: Right: Point of Measurement: 31 cm From Medial Instep 27.3 cm Ankle Left: Right: Point of Measurement: 11 cm From Medial Instep 21.8 cm Vascular Assessment Pulses: Dorsalis Pedis Palpable: [Left:Yes] Electronic Signature(s) Signed: 01/23/2021 5:35:19 PM By: Baruch Gouty RN, BSN Entered By: Baruch Gouty on 01/23/2021 11:14:40 -------------------------------------------------------------------------------- Multi-Disciplinary Care Plan Details Patient Name: Date of Service: [name]. 01/23/2021 10:45 A M Medical Record Number: [id number] Patient Account Number: [id number] Date of Birth/Sex: Treating RN: February 15, 1956 (65 y.o. [name] [name]: [name] Other Clinician: Referring [name]: Treating Dahlton Hinde/Extender: [name] in Treatment: 56 Multidisciplinary Care Plan reviewed with physician Active Inactive Venous Leg Ulcer Nursing Diagnoses: Actual venous Insuffiency (use after diagnosis is confirmed) Knowledge deficit related to disease  process and management Goals: Patient will maintain optimal edema control Date Initiated: 06/20/2020 Target Resolution Date: 01/30/2021 Goal Status: Active Interventions: Assess peripheral edema status every visit. Compression as ordered Treatment Activities: Therapeutic compression applied : 06/20/2020 Notes: Wound/Skin Impairment Nursing Diagnoses: Impaired tissue integrity Knowledge deficit related to ulceration/compromised skin integrity Goals: Patient/caregiver will verbalize understanding of skin care regimen Date Initiated: 01/17/2020 Target Resolution Date: 01/30/2021 Goal Status: Active Ulcer/skin breakdown will have a volume reduction of 30% by week 4 Date  Initiated: 01/17/2020 Date Inactivated: 03/12/2020 Target Resolution Date: 03/09/2020 Goal Status: Met Interventions: Assess patient/caregiver ability to obtain necessary supplies Assess patient/caregiver ability to perform ulcer/skin care regimen upon admission and as needed Assess ulceration(s) every visit Provide education on ulcer and skin care Notes: Electronic Signature(s) Signed: 01/23/2021 5:35:19 PM By: Baruch Gouty RN, BSN Entered By: Baruch Gouty on 01/23/2021 11:16:07 -------------------------------------------------------------------------------- Pain Assessment Details Patient Name: Date of Service: [name] Brandy [name], Burnis [name] [name]. 01/23/2021 10:45 A M Medical Record Number: [id number] Patient Account Number: [id number] Date of Birth/Sex: Treating RN: November 05, 1955 (65 y.o. [name] Elice Crigger: [name] Other Clinician: Referring Collin Hendley: Treating Bhavin Monjaraz/Extender: [name] in Treatment: 85 Active Problems Location of Pain Severity and Description of Pain Patient Has Paino Yes Site Locations Rate the pain. Current Pain Level: 4 Pain Management and Medication Current Pain Management: Electronic Signature(s) Signed: 01/23/2021 5:35:19 PM By:  Baruch Gouty RN, BSN Signed: 01/24/2021 7:44:17 AM By: [name] Entered By: [name] on 01/23/2021 10:51:22 -------------------------------------------------------------------------------- Patient/Caregiver Education Details Patient Name: Date of Service: [name], [name] 8/24/2022andnbsp10:45 A M Medical Record Number: [id number] Patient Account Number: [id number] Date of Birth/Gender: Treating RN: [birth year] (65 y.o. [name] Physician: [name] Other Clinician: Referring Physician: Treating Physician/Extender: [name] in Treatment: 66 Education Assessment Education Provided To: Patient Education Topics Provided Venous: Methods: Explain/Verbal Responses: Reinforcements needed, State content correctly Wound/Skin Impairment: Methods: Explain/Verbal Responses: Reinforcements needed, State content correctly Electronic Signature(s) Signed: 01/23/2021 5:35:19 PM By: Baruch Gouty RN, BSN Entered By: Baruch Gouty on 01/23/2021 11:16:34 -------------------------------------------------------------------------------- Wound Assessment Details Patient Name: Date of Service: [name], ELEA NO R [name]. 01/23/2021 10:45 A M Medical Record Number: [id number] Patient Account Number: [id number] Date of Birth/Sex: Treating RN: January 08, 1956 (65 y.o. [name] Hulda Reddix: [name] Other Clinician: Referring Blessyn [place]: Treating Brailon Don/Extender: [name] in Treatment: 3 Wound Status Wound Number: 15 Primary Venous Leg Ulcer Etiology: Wound Location: Left, Medial Malleolus Wound Status: Open Wounding Event: Gradually Appeared Comorbid Peripheral Venous Disease, Osteoarthritis, Confinement Date Acquired: 12/30/2019 History: Anxiety Weeks Of Treatment: 53 Clustered Wound: No Photos Wound Measurements Length: (cm) 3.4 Width: (cm) 2.4 Depth: (cm) 0.1 Area:  (cm) 6.409 Volume: (cm) 0.641 % Reduction in Area: 45% % Reduction in Volume: 93.1% Epithelialization: Small (1-33%) Tunneling: No Undermining: No Wound Description Classification: Full Thickness With Exposed Support Struct Wound Margin: Flat and Intact Exudate Amount: Medium Exudate Type: Serosanguineous Exudate Color: red, brown ures Wound Bed Granulation Amount: Large (67-100%) Exposed Structure Granulation Quality: Red Fascia Exposed: No Necrotic Amount: Small (1-33%) Fat Layer (Subcutaneous Tissue) Exposed: Yes Necrotic Quality: Adherent Slough Tendon Exposed: No Muscle Exposed: No Joint Exposed: No Bone Exposed: No Electronic Signature(s) Signed: 01/23/2021 5:35:19 PM By: Baruch Gouty RN, BSN Entered By: Baruch Gouty on 01/23/2021 11:15:43 -------------------------------------------------------------------------------- Vitals Details Patient Name: Date of Service: [name], ELEA NO R [name]. 01/23/2021 10:45 A M Medical Record Number: [id number] Patient Account Number: [id number] Date of Birth/Sex: Treating RN: 12/03/1955 (65 y.o. [name] Makhiya Coburn: [name] Other Clinician: Referring Vi Whitesel: Treating Kinsley Holderman/Extender: [name] in Treatment: 35 Vital Signs Time Taken: 10:49 Temperature (F): 98.2 Height (in): 68 Pulse (bpm): 60 Respiratory Rate (breaths/min): 18 Blood Pressure (mmHg): 135/69 Reference Range: 80 - 120 mg / dl Electronic Signature(s) Signed: 01/24/2021 7:44:17 AM By: [name] Entered By: [name] on 01/23/2021 10:51:11

## 2021-01-30 ENCOUNTER — Encounter (HOSPITAL_BASED_OUTPATIENT_CLINIC_OR_DEPARTMENT_OTHER): Payer: Medicare Other | Admitting: Physician Assistant

## 2021-01-30 ENCOUNTER — Other Ambulatory Visit: Payer: Self-pay

## 2021-01-30 DIAGNOSIS — I87332 Chronic venous hypertension (idiopathic) with ulcer and inflammation of left lower extremity: Secondary | ICD-10-CM | POA: Diagnosis not present

## 2021-01-30 NOTE — Progress Notes (Signed)
[name], [name] ([id number]) Visit Report for 01/30/2021 Arrival Information Details Patient Name: Date of Service: [name], [name] 01/30/2021 10:00 A M Medical Record Number: [id number] Patient Account Number: [id number] Date of Birth/Sex: Treating RN: 10-Jun-1955 (65 y.o. [name] Hazelyn Kallen: [name] Other Clinician: Referring Nishan Ovens: Treating Wynona Duhamel/Extender: [name] in Treatment: 10 Visit Information History Since Last Visit Added or deleted any medications: No Patient Arrived: Cane Any new allergies or adverse reactions: No Arrival Time: 10:01 Had a fall or experienced change in No Accompanied By: self activities of daily living that may affect Transfer Assistance: None risk of falls: Patient Identification Verified: Yes Signs or symptoms of abuse/neglect since last visito No Secondary Verification Process Completed: Yes Hospitalized since last visit: No Patient Requires Transmission-Based Precautions: No Implantable device outside of the clinic excluding No Patient Has Alerts: Yes cellular tissue based products placed in the [name] Patient Alerts: L ABI =1.12, TBI = .92 since last visit: R ABI= 1.02, TBI= .58 Has Dressing in Place as Prescribed: Yes Pain Present Now: Yes Electronic Signature(s) Signed: 01/30/2021 3:05:52 PM By: [name] Entered By: [name] on 01/30/2021 10:02:04 -------------------------------------------------------------------------------- Compression Therapy Details Patient Name: Date of Service: [name] [name], [name] 01/30/2021 10:00 A M Medical Record Number: [id number] Patient Account Number: [id number] Date of Birth/Sex: Treating RN: [birth year] (65 y.o. [name] Primary Care Rishik Tubby: [name] Other Clinician: Referring Merion Caton: Treating Felecia Stanfill/Extender: [name] in Treatment: 9 Compression Therapy Performed for Wound  Assessment: Wound #15 Left,Medial Malleolus Performed By: Clinician [name], RN Compression Type: Double Layer Post Procedure Diagnosis Same as Pre-procedure Electronic Signature(s) Signed: 01/30/2021 5:28:58 PM By: [name] Entered By: [name] on 01/30/2021 11:04:15 -------------------------------------------------------------------------------- Encounter Discharge Information Details Patient Name: Date of Service: [name] [name], [name] 01/30/2021 10:00 A M Medical Record Number: [id number] Patient Account Number: [id number] Date of Birth/Sex: Treating RN: Feb 05, 1956 (65 y.o. [name] Primary Care Cyril Railey: [name] Other Clinician: Referring Harrison Zetina: Treating Shanya Ferriss/Extender: [name] in Treatment: 53 Encounter Discharge Information Items Post Procedure Vitals Discharge Condition: Stable Temperature (F): 97.9 Ambulatory Status: Cane Pulse (bpm): 60 Discharge Destination: Home Respiratory Rate (breaths/min): 18 Transportation: Private Auto Blood Pressure (mmHg): 125/68 Schedule Follow-up Appointment: Yes Clinical Summary of Care: Provided on 01/30/2021 Form Type Recipient Paper Patient Patient Electronic Signature(s) Signed: 01/30/2021 1:05:53 PM By: [name] Entered By: [name] on 01/30/2021 13:05:52 -------------------------------------------------------------------------------- Lower Extremity Assessment Details Patient Name: Date of Service: [facility] [name], [name] 01/30/2021 10:00 A M Medical Record Number: [id number] Patient Account Number: [id number] Date of Birth/Sex: Treating RN: Jul 03, 1955 (65 y.o. [name] Primary Care Rowene Suto: [name] Other Clinician: Referring Cas Tracz: Treating Viraaj Vorndran/Extender: [name] in Treatment: 54 Edema Assessment Assessed: [Left: Yes] [Right: No] Edema: [Left: Ye] [Right: s] Calf Left: Right: Point of  Measurement: 31 cm From Medial Instep 27.3 cm Ankle Left: Right: Point of Measurement: 11 cm From Medial Instep 21.8 cm Vascular Assessment Pulses: Dorsalis Pedis Palpable: [Left:Yes] Electronic Signature(s) Signed: 01/30/2021 5:28:58 PM By: [name] Entered By: [name] on 01/30/2021 10:17:16 -------------------------------------------------------------------------------- Multi-Disciplinary Care Plan Details Patient Name: Date of Service: [name] [name], [name] 01/30/2021 10:00 A M Medical Record Number: [id number] Patient Account Number: [id number] Date of Birth/Sex: Treating RN: 14-Mar-1956 (65 y.o. [name] Primary Care Tobby Fawcett: [name] Other Clinician: Referring Greogry Goodwyn:  Treating Marye Eagen/Extender: [name] Weeks in Treatment: 58 Multidisciplinary Care Plan reviewed with physician Active Inactive Venous Leg Ulcer Nursing Diagnoses: Actual venous Insuffiency (use after diagnosis is confirmed) Knowledge deficit related to disease process and management Goals: Patient will maintain optimal edema control Date Initiated: 06/20/2020 Target Resolution Date: 02/27/2021 Goal Status: Active Interventions: Assess peripheral edema status every visit. Compression as ordered Treatment Activities: Therapeutic compression applied : 06/20/2020 Notes: 01/30/21: Edema control ongoing, using CoFlex wrap. Wound/Skin Impairment Nursing Diagnoses: Impaired tissue integrity Knowledge deficit related to ulceration/compromised skin integrity Goals: Patient/caregiver will verbalize understanding of skin care regimen Date Initiated: 01/17/2020 Target Resolution Date: 02/27/2021 Goal Status: Active Ulcer/skin breakdown will have a volume reduction of 30% by week 4 Date Initiated: 01/17/2020 Date Inactivated: 03/12/2020 Target Resolution Date: 03/09/2020 Goal Status: Met Interventions: Assess patient/caregiver ability to obtain necessary  supplies Assess patient/caregiver ability to perform ulcer/skin care regimen upon admission and as needed Assess ulceration(s) every visit Provide education on ulcer and skin care Notes: 01/30/21: Wound care regimen continues, PuraPly skin sub being used. Electronic Signature(s) Signed: 01/30/2021 5:28:58 PM By: [name] Entered By: [name] on 01/30/2021 10:20:12 -------------------------------------------------------------------------------- Pain Assessment Details Patient Name: Date of Service: [name] [name], [name] 01/30/2021 10:00 A M Medical Record Number: [id number] Patient Account Number: [id number] Date of Birth/Sex: Treating RN: 12-Jul-1955 (65 y.o. [name] Kaiden Pech: [name] Other Clinician: Referring Lubna Stegeman: Treating Wesson Stith/Extender: [name] in Treatment: 92 Active Problems Location of Pain Severity and Description of Pain Patient Has Paino Yes Site Locations Rate the pain. Current Pain Level: 3 Pain Management and Medication Current Pain Management: Electronic Signature(s) Signed: 01/30/2021 3:05:52 PM By: [name] Signed: 01/30/2021 4:49:46 PM By: Baruch Gouty RN, BSN Entered By: [name] on 01/30/2021 10:02:25 -------------------------------------------------------------------------------- Patient/Caregiver Education Details Patient Name: Date of Service: [name] [name], [name] 8/31/2022andnbsp10:00 A M Medical Record Number: [id number] Patient Account Number: [id number] Date of Birth/Gender: Treating RN: 05-25-1956 (65 y.o. [name] Primary Care Physician: [name] Other Clinician: Referring Physician: Treating Physician/Extender: [name] in Treatment: 7 Education Assessment Education Provided To: Patient Education Topics Provided Venous: Methods: Explain/Verbal, Printed Responses: State content correctly Wound/Skin  Impairment: Methods: Explain/Verbal, Printed Responses: State content correctly Electronic Signature(s) Signed: 01/30/2021 5:28:58 PM By: [name] Entered By: [name] on 01/30/2021 10:21:11 -------------------------------------------------------------------------------- Wound Assessment Details Patient Name: Date of Service: [name] [name], [name] 01/30/2021 10:00 A M Medical Record Number: [id number] Patient Account Number: [id number] Date of Birth/Sex: Treating RN: 08-08-1955 (65 y.o. [name], [name] Primary Care Lilyana Lippman: [name] Other Clinician: Referring Jeiden Daughtridge: Treating Mattheo Swindle/Extender: [name] in Treatment: 49 Wound Status Wound Number: 15 Primary Venous Leg Ulcer Etiology: Wound Location: Left, Medial Malleolus Wound Status: Open Wounding Event: Gradually Appeared Comorbid Peripheral Venous Disease, Osteoarthritis, Confinement Date Acquired: 12/30/2019 History: Anxiety Weeks Of Treatment: 54 Clustered Wound: No Photos Wound Measurements Length: (cm) 3.2 Width: (cm) 1.5 Depth: (cm) 0.1 Area: (cm) 3.77 Volume: (cm) 0.377 % Reduction in Area: 67.7% % Reduction in Volume: 96% Epithelialization: Small (1-33%) Tunneling: No Undermining: No Wound Description Classification: Full Thickness With Exposed Support Structures Wound Margin: Distinct, outline attached Exudate Amount: Medium Exudate Type: Serosanguineous Exudate Color: red, brown Foul Odor After Cleansing: No Slough/Fibrino Yes Wound Bed Granulation Amount: Large (67-100%) Exposed Structure Granulation Quality: Red Fascia Exposed: No Necrotic Amount: Small (1-33%) Fat Layer (Subcutaneous Tissue) Exposed:  Yes Necrotic Quality: Adherent Slough Tendon Exposed: No Muscle Exposed: No Joint Exposed: No Bone Exposed: No Treatment Notes Wound #15 (Malleolus) Wound Laterality: Left, Medial Cleanser Soap and Water Discharge Instruction: May shower and  wash wound with dial antibacterial soap and water prior to dressing change. Peri-Wound Care Triamcinolone 15 (g) Discharge Instruction: Use triamcinolone 15 (g) as directed Sween Lotion (Moisturizing lotion) Discharge Instruction: Apply moisturizing lotion as directed Topical Primary Dressing PuraPly AM Secondary Dressing Woven Gauze Sponge, Non-Sterile 4x4 in Discharge Instruction: Fold to bolster skin sub ABD Pad, 5x9 Discharge Instruction: Apply over primary dressing and lateral ankle Optifoam Non-Adhesive Dressing, 4x4 in Discharge Instruction: Apply over primary dressing pad lateral foot with foam donut ADAPTIC TOUCH 3x4.25 in Discharge Instruction: Apply over skin sub Secured With Compression Wrap CoFlex TLC XL 2-layer Compression System 4x7 (in/yd) Discharge Instruction: Apply CoFlex 2-layer compression as directed. (alt for 4 layer) Compression Stockings Add-Ons Electronic Signature(s) Signed: 01/30/2021 5:28:58 PM By: [name] Signed: 01/30/2021 6:06:32 PM By: Deon Pilling Entered By: [name] on 01/30/2021 10:18:17 -------------------------------------------------------------------------------- Vitals Details Patient Name: Date of Service: [name] [name], [name] 01/30/2021 10:00 A M Medical Record Number: [id number] Patient Account Number: [id number] Date of Birth/Sex: Treating RN: 1955-09-07 (65 y.o. [name] Rosine Solecki: [name] Other Clinician: Referring Mantaj Chamberlin: Treating Rya Rausch/Extender: [name] in Treatment: 42 Vital Signs Time Taken: 10:05 Temperature (F): 97.9 Height (in): 68 Pulse (bpm): 60 Respiratory Rate (breaths/min): 18 Blood Pressure (mmHg): 125/68 Reference Range: 80 - 120 mg / dl Electronic Signature(s) Signed: 01/30/2021 3:05:52 PM By: [name] Entered By: [name] on 01/30/2021 10:05:57

## 2021-01-31 NOTE — Progress Notes (Addendum)
[name], BIRNBAUM ([id number]) Visit Report for 01/30/2021 Chief Complaint Document Details Patient Name: Date of Service: [facility] [name], [name] 01/30/2021 10:00 A M Medical Record Number: [id number] Patient Account Number: [id number] Date of Birth/Sex: Treating RN: 1955/11/19 (65 y.o. [name] Provider: [name] Other Clinician: Referring Provider: Treating Provider/Extender: [name] in Treatment: 66 Information Obtained from: Patient Chief Complaint patient is been followed long-term in this clinic for venous insufficiency ulcers with inflammation, hypertension and ulceration over the medial ankle bilaterally. 01/17/2020; this is a patient who is here for review of postoperative wounds on the left lateral ankle and recurrence of venous stasis ulceration on the left medial Electronic Signature(s) Signed: 01/30/2021 10:53:28 AM By: [name] PA-C Entered By: [name] on 01/30/2021 10:53:28 -------------------------------------------------------------------------------- Cellular or Tissue Based Product Details Patient Name: Date of Service: [facility] [name], [name] 01/30/2021 10:00 A M Medical Record Number: [id number] Patient Account Number: [id number] Date of Birth/Sex: Treating RN: 07-02-1955 (65 y.o. [name] Primary Care Provider: [name] Other Clinician: Referring Provider: Treating Provider/Extender: [name] in Treatment: 14 Cellular or Tissue Based Product Type Wound #15 Left,Medial Malleolus Applied to: Performed By: Physician [name], PA Cellular or Tissue Based Product Type: Puraply Level of Consciousness (Pre-procedure): Awake and Alert Pre-procedure Verification/Time Out Yes - 11:01 Taken: Location: trunk / arms / legs Wound Size (sq cm): 4.8 Product Size (sq cm): 12 Waste Size (sq cm): 0 Amount of Product Applied (sq cm): 12 Instrument Used: Forceps,  Scissors Lot #: [id number] Expiration Date: 04/23/2023 Reconstituted: Yes Solution Type: normal saline Solution Amount: 3 ml Lot #: [id number] Solution Expiration Date: 01/31/2022 Secured: Yes Secured With: Adaptic Dressing Applied: Yes Primary Dressing: Gauze, CoFlex Response to Treatment: Procedure was tolerated well Level of Consciousness (Post- Awake and Alert procedure): Post Procedure Diagnosis Same as Pre-procedure Electronic Signature(s) Signed: 01/30/2021 5:21:16 PM By: [name] PA-C Signed: 01/30/2021 5:28:58 PM By: [name] Entered By: [name] on 01/30/2021 11:03:56 -------------------------------------------------------------------------------- HPI Details Patient Name: Date of Service: [name], [name] 01/30/2021 10:00 A M Medical Record Number: [id number] Patient Account Number: [id number] Date of Birth/Sex: Treating RN: Jul 21, 1955 (65 y.o. [name] Provider: [name] Other Clinician: Referring Provider: Treating Provider/Extender: [name] in Treatment: 69 History of Present Illness HPI Description: the remaining wound is over the left medial ankle. Similar wound over the right medial ankle healed largely with use of Apligraf. Most recently we have been using Hydrofera Blue over this wound with considerable improvement. The patient has been extensively worked up in the past for her venous insufficiency and she is not a candidate for antireflux surgery although I have none of the details available currently. 08/24/14; considerable improvement today. About 50% of this wound areas now epithelialized. The base of the wound appears to be healthier granulation.as opposed to last week when she had deteriorated a considerable improvement 08/17/14; unfortunately the wound has regressed somewhat. The areas of epithelialization from the superior aspect are not nearly as healthy as they were last week.  The patient thinks her Hydrofera Blue slipped. 09/07/14; unfortunately the area has markedly regressed in the 2 weeks since I've seen this. There is an odor surrounding erythema. The healthy granulation tissue that we had at the base of the wound now is a dusky color. The nurse reports green drainage 09/14/14; the area  looks somewhat better than last week. There is less erythema and less drainage. The culture I did did not show any growth. Nevertheless I think it is better to continue the Cipro and doxycycline for a further week. The remaining wound area was debridement. 09/21/14. Wound did not require debridement last week. Still less erythema and less drainage. She can complete her antibiotics. The areas of epithelialization in the superior aspect of the wound do not look as healthy as they did some weeks ago 10/05/14 continued improvement in the condition of this wound. There is advancing epithelialization. Less aggressive debridement required 10/19/14 continued improvement in the condition and volume of this wound. Less aggressive debridement to the inferior part of this to remove surface slough and fibrinous eschar 11/02/14 no debridement is required. The surface granulation appears healthy although some of her islands of epithelialization seem to have regressed. No evidence of infection 11/16/14; lites surface debridement done of surface eschar. The wound does not look to be unhealthy. No evidence of infection. Unfortunately the patient has had podiatry issues in the right foot and for some reason has redeveloped small surface ulcerations in the medial right ankle. Her original presentation involved wounds in this area 11/23/14 no debridement. The area on the right ankle has enlarged. The left ankle wound appears stable in terms of the surface although there is periwound inflammation. There has been regression in the amount of new skin 11/30/14 no debridement. Both wound areas appear healthy. There was no  evidence of infection. The the new area on the right medial ankle has enlarged although that both the surfaces appear to be stable. 12/07/14; Debridement of the right medial ankle wound. No no debridement was done on the left. 12/14/14 no major change in and now bilateral medial ankle wounds. Both of these are very painful but the no overt evidence of infection. She has had previous venous ablation 12/21/14; patient states that her right medial ankle wound is considerably more painful last week than usual. Her left is also somewhat painful. She could not tolerate debridement. The right medial ankle wound has fibrinous surface eschar 12/28/14 this is a patient with severe bilateral venous insufficiency ulcers. For a considerable period of time we actually had the one on the right medial ankle healed however this recently opened up again in June. The left medial ankle wound has been a refractory area with some absent flows. We had some success with Hydrofera Blue on this area and it literally closed by 50% however it is recently opened up Foley. Both of these were debridement today of surface eschar. She tolerates this poorly 01/25/15: No change in the status of this. Thick adherent escar. Very poor tolerance of any attempt at debridement. I had healed the right medial malleolus wound for a considerable amount of time and had the left one down to about 50% of the volume although this is totally regressed over the last 48 weeks. Further the right leg has reopened. she is trying to make a appointment with pain and vascular, previous ablations with Dr. Aleda Grana. I do not believe there is an arterial insufficiency issue here 02/01/15 the status of the adherent eschar bilaterally is actually improved. No debridement was done. She did not manage to get vascular studies done 02/08/15 continued debridement of the area was done today. The slough is less adherent and comes off with less pressure. There is no  surrounding infection peripheral pulses are intact 02/15/15 selective debridement with a disposable curette. Again the slough  is less adherent and comes off with less difficulty. No surrounding infection peripheral pulses are intact. 02/22/15 selective debridement of the right medial ankle wound. Slough comes off with less difficulty. No obvious surrounding infection peripheral pulses are intact I did not debridement the one on the left. Both of these are stable to improved 03/01/15 selective debridement of both wound areas using a curette to. Adherent slough cup soft with less difficulty. No obvious surrounding infection. The patient tells me that 2 days ago she noted a rash above the right leg wrap. She did not have this on her lower legs when she change this over she arrives with widespread left greater than right almost folliculitis-looking rash which is extremely pruritic. I don't see anything to culture here. There is no rash on the rest of her body. She feels well systemically. 03/08/15; selective debridement of both wounds using a curette. Base of this does not look unhealthy. She had limegreen drainage coming out of the left leg wound and describes a lot of drainage. The rash on her left leg looks improved to. No cultures were done. 03/22/15; patient was not here last week. Basal wounds does not look healthy and there is no surrounding erythema. No drainage. There is still a rash on the left leg that almost looks vasculitic however it is clearly limited to the top of where the wrap would be. 04/05/15; on the right required a surgical debridement of surface eschar and necrotic subcutaneous tissue. I did not debridement the area on the left. These continue to be large open wounds that are not changing that much. We were successful at one point in healing the area on the right, and at the same time the area on the left was roughly half the size of current measurements. I think a lot of the  deterioration has to do with the prolonged time the patient is on her feet at work 04/19/15 I attempted-like surface debridement bilaterally she does not tolerate this. She tells me that she was in allergic care yesterday with extreme pain over her left lateral malleolus/ankle and was told that she has an "sprain" 05/03/15; large bilateral venous insufficiency wounds over the medial malleolus/medial aspect of her ankles. She complains of copious amounts of drainage and his usual large amounts of pain. There is some increasing erythema around the wound on the right extending into the medial aspect of her foot to. historically she came in with these wounds the right one healed and the left one came down to roughly half its current size however the right one is reopened and the left is expanded. 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 05/10/15 large bilateral venous insufficiency wounds. There is less adherence surface left however the surface culture that I did last week grew pseudomonas therefore bilateral selective debridement score necessary. There is surrounding erythema. The patient describes severe bilateral drainage and a lot of pain in the left ankle. Apparently her podiatrist was were ready to do a cortisone shot 05/17/15; the patient complains of pain and again copious amounts of drainage. 05/24/15; we used Iodo flex last week. Patient notes considerable improvement in wound drainage. Only needed to change this once. 05/31/15; we continued Iodoflex; the base of these large wounds bilaterally is not too bad but there is probably likely a significant bioburden here. I would like to debridement just doesn't tolerate it. 06/06/14 I would like to continue the  Iodoflex although she still hasn't managed to obtain supplies. She has bilateral medial malleoli or large wounds which are mostly superficial. Both of them are covered circumferentially with  some nonviable fibrinous slough although she tolerates debridement very poorly. She apparently has an appointment for an ablation on the right leg by interventional radiology. 06/14/15; the patient arrives with the wounds and static condition. We attempted a debridement although she does not do well with this secondary to pain. I 07/05/15; wounds are not much smaller however there appears to be a cleaner granulating base. The left has tight fibrinous slough greater than the right. Debridement is tolerated poorly due to pain. Iodoflex is done more for these wounds in any of the multitude of different dressings I have tried on the left 1 and then subsequently the right. 07/12/15; no change in the condition of this wound. I am able to do an aggressive debridement on the right but not the left. She simply cannot tolerate it. We have been using Iodoflex which helps somewhat. It is worthwhile remembering that at one point we healed the right medial ankle wound and the left was about 25% of the current circumference. We have suggested returning to vascular surgery for review of possible further ablations for one reason or another she has not been able to do this. 07/26/15 no major change in the condition of either wound on her medial ankle. I did not attempt to debridement of these. She has been aggressively scrubbing these while she is in the shower at home. She has her supply of Iodoflex which seems to have done more for these wounds then anything I have put on recently. 08/09/15 wound area appears larger although not verified by measurements. Using Iodoflex 09/05/2015 -- she was here for avisit today but had significant problems with the wound and I was asked to see her for a physician opinion. I have summarize that this lady has had surgery on her left lower extremity about 10 years ago where the possible veins stripping was done. She has had an opinion from interventional radiology around November 2016 where  no further sclerotherapy was ordered. The patient works 12 hours a day and stands on a concrete floor with work boots and is unable to get the proper compression she requires and cannot elevate her limbs appropriately at any given time. She has recently grown Pseudomonas from her wound culture but has not started her ciprofloxacin which was called in for her. 09/13/15 this continues to be a difficult situation for this patient. At one point I had this wound down to a 1.5 x 1.5" wound on her left leg. This is deteriorated and the right leg has reopened. She now has substantial wounds on her medial calcaneus, malleoli and into her lower leg. One on the left has surface eschar but these are far too painful for me to debridement here. She has a vascular surgery appointment next week to see if anything can be done to help here. I think she has had previous ablations several years ago at [facility]. She has no major edema. She tells me that she did not get product last time [facility]] and went for several days without it. She continues to work in work boots 12 hours a day. She cannot get compression/4-layer under her work boots. 09/20/15 no major change. Periwound edema control was not very good. Her point with pain and vascular is next Wednesday the 25th 09/28/15; the patient is seen vascular surgery and is  apparently scheduled for repeat duplex ultrasounds of her bilateral lower legs next week. 10/05/15; the patient was seen by Dr. Doren Custard of vascular surgery. He feels that she should have arterial insufficiency excluded as cause/contributed to her nonhealing stage she is therefore booked for an arteriogram. She has apparently monophasic signals in the dorsalis pedis pulses. She also of course has known severe chronic venous insufficiency with previous procedures as noted previously. I had another long discussion with the patient today about her continuing to work 12 hour shifts. I've written her out for 2  months area had concerns about this as her work location is currently undergoing significant turmoil and this may lead to her termination. She is aware of this however I agree with her that she simply cannot continue to stand for 12 hours multiple days a week with the substantial wound areas she has. 10/19/15; the Dr. Doren Custard appointment was largely for an arteriogram which was normal. She does not have an arterial issue. He didn't make a comment about her chronic venous insufficiency for which she has had previous ablations. Presumably it was not felt that anything additional could be done. The patient is now out of work as I prescribed 2 weeks ago. Her wounds look somewhat less aggravated presumably because of this. I felt I would give debridement another try today 10/25/15; no major change in this patient's wounds. We are struggling to get her product that she can afford into her own home through her insurance. 11/01/15; no major change in the patient's wounds. I have been using silver alginate as the most affordable product. I spoke to Dr. Marla Roe last week with her requested take her to the OR for surgical debridement and placement of ACEL. Dr. Marla Roe told me that she would be willing to do this however [facility] will not cover this, fortunately the patient has [place] healthcare of some variant 11/08/15; no major change in the patient's wounds. She has been completely nonviable surface that this but is in too much pain with any attempted debridement are clinic. I have arranged for her to see Dr. Marla Roe ham of plastic surgery and this appointment is on Monday. I am hopeful that they will take her to the OR for debridement, possible ACEL ultimately possible skin graft 11/22/15 no major change in the patient's wounds over her bilateral medial calcaneus medial malleolus into the lower legs. Surface on these does not look too bad however on the left there is surrounding erythema and  tenderness. This may be cellulitis or could him sleepy tinea. 11/29/15; no major changes in the patient's wounds over her bilateral medial malleolus. There is no infection here and I don't think any additional antibiotics are necessary. There is now plan to move forward. She sees Dr. Marla Roe in a week's time for preparation for operative debridement and ACEL placement I believe on 7/12. She then has a follow-up appointment with Dr. Marla Roe on 7/21 12/28/15; the patient returns today having been taken to the [facility] by Dr. Marla Roe 12/12/15 she underwent debridement, intraoperative cultures [which were negative]. She had placement of a wound VAC. Parent really ACEL was not available to be placed. The wound VAC foam apparently adhered to the wound since then she's been using silver alginate, Xeroform under Ace wraps. She still says there is a lot of drainage and a lot of pain 01/31/16; this is a patient I see monthly. I had referred her to Dr. Marla Roe him of plastic surgery for large wounds on  her bilateral medial ankles. She has been to the OR twice once in early July and once in early August. She tells me over the last 3 weeks she has been using the wound VAC with ACEL underneath it. On the right we've simply been using silver alginate. Under Kerlix Coban wraps. 02/28/16; this is a patient I'm currently seeing monthly. She is gone on to have a skin graft over her large venous insufficiency ulcer on the left medial ankle. This was done by Dr. Marla Roe him. The patient is a bit perturbed about why she didn't have one on her right medial ankle wound. She has been using silver alginate to this. 03/06/16; I received a phone call from her plastic surgery Dr. Marla Roe. She expressed some concern about the viability of the skin graft she did on the left medial ankle wound. Asked me to place Endoform on this. She told me she is not planning to do a subsequent skin graft on the right as the left one did  not take very well. I had placed Hydrofera Blue on the right 03/13/16; continue to have a reasonably healthy wound on the right medial ankle. Down to 3 mm in terms of size. There is epithelialization here. The area on the left medial ankle is her skin graft site. I suppose the last week this looks somewhat better. She has an open area inferiorly however in the center there appears to be some viable tissue. There is a lot of surface callus and eschar that will eventually need to come off however none of this looked to be infected. Patient states that the is able to keep the dressing on for several days which is an improvement. 03/20/16 no major change in the circumference of either wound however on the left side the patient was at Dr. Eusebio Friendly office and they did a debridement of left wound. 50% of the wound seems to be epithelialized. I been using Endoform on the left Hydrofera Blue in the right 03/27/16; she arrives today with her wound is not looking as healthy as they did last week. The area on the right clearly has an adherent surface to this a very similar surface on the left. Unfortunately for this patient this is all too familiar problem. Clearly the Endoform is not working and will need to change that today that has some potential to help this surface. She does not tolerate debridement in this clinic very well. She is changing the dressing wants 04/03/16; patient arrives with the wounds looking somewhat better especially on the right. Dr. Migdalia Dk change the dressing to silver alginate when she saw her on Monday and also sold her some compression socks. The usefulness of the latter is really not clear and woman with severely draining wounds. 04/10/16; the patient is doing a bit of an experiment wearing the compression stockings that Dr. Migdalia Dk provided her to her left leg and the out of legs based dressings that we provided to the right. 05/01/16; the patient is continuing to wear compression  stockings Dr. Migdalia Dk provided her on the left that are apparently silver impregnated. She has been using Iodoflex to the right leg wound. Still a moderate amount of drainage, when she leaves here the wraps only last for 4 days. She has to change the stocking on the left leg every night 05/15/16; she is now using compression stockings bilaterally provided by Dr. Marla Roe. She is wearing a nonadherent layer over the wounds so really I don't think there is anything specific  being done to this now. She has some reduction on the left wound. The right is stable. I think all healing here is being done without a specific dressing 06/09/16; patient arrives here today with not much change in the wound certainly in diameter to large circular wounds over the medial aspect of her ankle bilaterally. Under the light of these services are certainly not viable for healing. There is no evidence of surrounding infection. She is wearing compression stockings with some sort of silver impregnation as prescribed by Dr. Marla Roe. She has a follow-up with her tomorrow. 06/30/16; no major change in the size or condition of her wounds. These are still probably covered with a nonviable surface. She is using only her purchase stockings. She did see Dr. Marla Roe who seemed to want to apply Dakin's solution to this I'm not extreme short what value this would be. I would suggest Iodoflex which she still has at home. 07/28/16; I follow Mrs. Foor episodically along with Dr. Marla Roe. 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 She has been applying a topical collagen ointment to both wounds with Adaptic. I don't think Dr. Marla Roe is planning to take her back to the OR. 08/19/16; I follow Mrs. [name] on a monthly basis along with Dr. Marla Roe of plastic surgery. She has very refractory venous insufficiency wounds on the bilateral medial lower legs left greater than  right. I been following her for a number of years. At one point I was able to get the right medial malleolus wound to heal and had the left medial malleolus down to about half its current size however and I had to send her to plastic surgery for an operative debridement. Since then things have been stable to slightly improve the area on the right is slightly better one in the left about the same although there is much less adherent surface than I'm used to with this patient. She is using some form of liquid collagen gel that Dr. Marla Roe provided a Kerlix cover with the patient's own pressure stockings. She tells me that she has extreme pain in both ankles and along the lateral aspect of both feet. She has been unable to work for some period of time. She is telling me she is retiring at the beginning of April. She sees Dr. Doran Durand of orthopedics next week 09/22/16; patient has not seen Dr. Marla Roe since the last time she is here. I'm not really sure what she is using to the wounds other than bits and pieces of think she had left over including most recently Hydrofera Blue. She is using juxtalite stockings. She is having difficulty with her husband's recent illness "stroke". She is having to transport him to various doctors appointments. Dr. Marla Roe left her the option of a repeat debridement with ACEL however she has not been able to get the time to follow-up on this. She continues to have a fair amount of drainage out of these wounds with certainly precludes leaving dressings on all week 10/13/16; patient has not seen Dr. Marla Roe since she was last in our clinic. I'm not really sure what she is doing with the wounds, we did try to get her [facility] and I think she is actually using this most of the time. Because of drainage she states she has to change this every second day although this is an improvement from what she used to do. She went to see Dr. Doran Durand who did not think  she had a  muscular issue with regards to her feet, he referred her to a neurologist and I think the appointment is sometime in June. I changed her back to Iodoflex which she has used in the past but not recently. 11/03/16; the patient has been using Iodoflex although she ran out of this. Still claims that there is a lot of drainage although the wound does not look like this. No surrounding erythema. She has not been back to see Dr. Marla Roe 11/24/16; the patient has been using Iodoflex again but she ran out of it 2 or 3 days ago. There is no major change in the condition of either one of these wounds in fact they are larger and covered in a thick adherent surface slough/nonviable tissue especially on the left. She does not tolerate mechanical debridement in our clinic. Going back to see Dr. Marla Roe of plastic surgery for an operative debridement would seem reasonable. 12/15/16; the patient has not been back to see Dr. Marla Roe. She is been dealing with a series of illnesses and her husband which of monopolized her time. She is been using Sorbact which we largely supplied. She states the drainage is bad enough that it maximum she can go 2-3 days without changing the dressing 01/12/2017 -- the patient has not been back for about 4 weeks and has not seen Dr. Marla Roe not does she have any appointment pending. 01/23/17; patient has not seen Dr. Marla Roe even though I suggested this previously. She is using Santyl that was suggested last week by Dr. Con Memos this Cost her $16 through her insurance which is indeed surprising 02/12/17; continuing Santyl and the patient is changing this daily. A lot of drainage. She has not been back to see plastic surgery she is using an Ace wrap. Our intake nurse suggested wrap around stockings which would make a good reasonable alternative 02/26/17; patient is been using Santyl and changing this daily due to drainage. She has not been to see plastic surgery she uses in April Ace  wrap to control the edema. She did obtain extremitease stockings but stated that the edema in her leg was to big for these 03/20/17; patient is using Santyl and Anasept. Surfaces looked better today the area on the right is actually measuring a little smaller. She has states she has a lot of pain in her feet and ankles and is asking for a consult to pain control which I'll try to help her with through our case manager. 04/10/17; the patient arrives with better-looking wound surfaces and is slightly smaller wound on the left she is using a combination of Santyl and Anasept. She has an appointment or at least as started in the pain control center associated with [facility] 05/14/17; this is a patient who I followed for a prolonged period of time. She has venous insufficiency ulcers on her bilateral medial ankles. At one point I had this down to a much smaller wound on the left however these reopened and we've never been able to get these to heal. She has been using Santyl and Anasept gel although 2 weeks ago she ran out of the Anasept gel. She has a stable appearance of the wound. She is going to the wound care clinic at [facility]. They wanted do a nerve block/spinal block although she tells me she is reluctant to go forward with that. 05/21/17; this is a patient I have followed for many years. She has venous insufficiency ulcers on her bilateral medial ankles. Chronic  pain and deformity in her ankles as well. She is been to see plastic surgery as well as orthopedics. Using PolyMem AG most recently/Kerramax/ABDs and 2 layer compression. She has managed to keep this on and she is coming in for a nurse check to change the dressing on Tuesdays, we see her on Fridays 06/05/17; really quite a good looking surface and the area especially on the right medial has contracted in terms of dimensions. Well granulated healthy-looking tissue on both sides. Even with an open curet there is nothing that even  feels abnormal here. This is as good as I've seen this in quite some time. We have been using PolyMem AG and bringing her in for a nurse check 06/12/17; really quite good surface on both of these wounds. The right medial has contracted a bit left is not. We've been using PolyMem and AG and she is coming in for a nurse visit 06/19/17; we have been using PolyMem AG and bringing her in for a nurse check. Dimensions of her wounds are not better but the surfaces looked better bilaterally. She complained of bleeding last night and the left wound and increasing pain bilaterally. She states her wound pain is more neuropathic than just the wounds. There was some suggestion that this was radicular from her pain management doctor in talking to her it is really difficult to sort this out. 06/26/17; using PolyMem and AG and bringing her in for a nurse check as All of this and reasonably stable condition. Certainly not improved. The dimensions on the lateral part of the right leg look better but not really measuring better. The medial aspect on the left is about the same. 07/03/16; we have been using PolyMen AG and bringing her in for a nurse check to change the dressings as the wounds have drainage which precludes once weekly changing. We are using all secondary absorptive dressings.our intake nurse is brought up the idea of using a wound VAC/snap VAC on the wound to help with the drainage to see if this would result in some contraction. This is not a bad idea. The area on the right medial is actually looking smaller. Both wounds have a reasonable-looking surface. There is no evidence of cellulitis. The edema is well controlled 07/10/17; the patient was denied for a snap VAC by her insurance. The major issue with these wounds continues to be drainage. We are using wicked PolyMem AG and she is coming in for a nurse visit to change this. The wounds are stable to slightly improved. The surface looks vibrant and the area on  the right certainly has shrunk in size but very slowly 07/17/17; the patient still has large wounds on her bilateral medial malleoli. Surface of both of these wounds looks better. The dimensions seem to come and go but no consistent improvement. There is no epithelialization. We do not have options for advanced treatment products due to insurance issues. They did not approve of the wound VAC to help control the drainage. More recently we've been using PolyMem and AG wicked to allow drainage through. We have been bringing her in for a nurse visit to change this. We do not have a lot of options for wound care products and the home again due to insurance issues 07/24/17; the patient's wound actually looks somewhat better today. No drainage measurements are smaller still healthy-looking surface. We used silver collagen under PolyMen started last week. We have been bringing her in for a dressing change 07/31/17; patient's wound surface  continued to look better and I think there is visible change in the dimensions of the wound on the right. Rims of epithelialization. We have been using silver collagen under PolyMen and bringing her in for a dressing change. There appears to be less drainage although she is still in need of the dressing change 08/07/17. Patient's wound surface continues to look better on both sides and the area on the right is definitely smaller. We have been using silver collagen and PolyMen. She feels that the drainage has been it has been better. I asked her about her vascular status. She went to see Dr. Aleda Grana at [facility] and had some form of ablation. I don't have much detail on this. I haven't my notes from 2016 that she was not a candidate for any further ablation but I don't have any more information on this. We had referred her to vein and vascular I don't think she ever went. He does not have a history of PAD although I don't have any information on this either. We don't even  have ABIs in our record 08/14/17; we've been using silver collagen and PolyMen cover. And putting the patient and compression. She we are bringing her in as a nurse visit to change this because ofarge amount of drainage. We didn't the ABIs in clinic today since they had been done in many moons 1.2 bilaterally. She has been to see vein and vascular however this was at [facility] and she had ablation although I really don't have any information on this all seemed biking get a report. She is also been operatively debrided by plastic surgery and had a cell placed probably 8-12 months ago. This didn't have a major effect. We've been making some gains with current dressings 08/19/17-She is here in follow-up evaluation for bilateral medial malleoli ulcers. She continues to tolerate debridement very poorly. We will continue with recently changed topical treatment; if no significant improvement may consider switching to Iodosorb/Iodoflex. 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 She has been using silver collagen and PolyMem. I believe she has been used and tried on Iodoflex before. 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 These have been very refractory over time. We have been using silver collagen and PolyMen. She is coming in once a week for a doctor's and once a week for nurse visits. We are actually making some progress 09/18/17; the patient's wounds are smaller especially on the right medial. She arrives today to upset to consider even washing these off with Anasept which I think is been part of the reason this is been closing. We've been using collagen covered in PolyMen otherwise. It is noted that she has a small area of folliculitis on the right medial calf  that. As we are wrapping her legs I'll give her a short course of doxycycline to make sure this doesn't amount to anything. She is a long list of complaints today including imbalance, shortness of breath on exertion, inversion of her left ankle. With regards to the latter complaints she is been to see orthopedics and they offered her a tendon release surgery I believe but wanted her wounds to be closed first. I have recommended she go see her primary physician with regards to everything else. 09/25/17; patient's wounds are  about the same size. We have made some progress bilaterally although not in recent weeks. She will not allow me T wash these wounds with Anasept even if she is doing her cell. Wheeze we've been using collagen covered in PolyMen. Last week she had a small area of folliculitis this is now opened into a small wound. She completed 5 days of trimethoprim sulfamethoxazole 10/02/17; unfortunately the area on her left medial ankle is worse with a larger wound area towards the Achilles. The patient complains of a lot of pain. She will not allow debridement although visually I don't think there is anything to debridement in any case. We have been using silver collagen and PolyMen for several months now. Initially we are making some progress although I'm not really seeing that today. We will move back to [facility]. His admittedly this is a bit of a repeat however I'm hoping that his situation is different now. The patient tells me she had her leg on the left give out on her yesterday this is process some pain. 10/09/17; the patient is seen twice a week largely because of drainage issues coming out of the chronic medial bimalleolar wounds that are chronic. Last week the dimensions of the one on the left looks a little larger I changed her to [facility]. She comes in today with a history of terrible pain in the bilateral wound areas. She will not allow debridement. She will not even allow a  tissue culture. There is no surrounding erythema no no evidence of cellulitis. We have been putting her Kerlix Coban man. She will not allow more aggressive compression as there was a suggestion to put her in 3 layer wraps. 10/16/17; large wounds on her bilateral medial malleoli. These are chronic. Not much change from last week. The surface looks have healthy but absolutely no epithelialization. A lot of pain little less so of drainage. She will not allow debridement or even washing these off in the vigorous fashion with Anasept. 10/23/17; large wounds on her bilateral malleoli which are chronic. Some improvement in terms of size perhaps on the right since last time I saw these. She states that after we increased the 3 layer compression there was some bleeding, when she came in for a nurse visit she did not want 3 layer compression put back on about our nurse managed to convince her. She has known chronic venous visit issues and I'm hoping to get her to tolerate the 3 layer compression. using Hydrofera Blue 10/30/17; absolutely no change in the condition of either wound although we've had some improvement in dimensions on the right.. Attempted to put her in 3 layer compression she didn't tolerated she is back in 2 layer compression. We've been using Hydrofera Blue We looked over her past records. She had venous reflux studies in November 2016. There was no evidence of deep venous reflux on the right. Superficial vein did not show the greater saphenous vein at think this is been previously ablated the small saphenous vein was within normal limits. The left deep venous system showed no DVT the vessels were positive for deep venous reflux in the posterior tibial veins at the ankle. The greater saphenous vein was surgically absent small saphenous vein was within normal limits. She went to vein and vascular at [facility]. I believe she had an ablation on the left greater saphenous vein. I'll update her  reflux studies perhaps ever reviewed by vein and vascular. We've made absolutely no progress in these  wounds. Will also try to read and TheraSkins through her insurance 11/06/17; W the patient apparently has a 2 week follow-up with vein and vascular I like him to review the whole issue with regards to her previous vascular workup by Dr. Aleda Grana. We've really made no progress on these wounds in many months. She arrives today with less viable looking surface on the left medial ankle wound. This was apparently looking about the same on Tuesday when she was here for nurse visit. 11/13/17; deep tissue culture I did last time of the left lower leg showed multiple organisms without any predominating. In particular no Staphylococcus or group A strep were isolated. We sent her for venous reflux studies. She's had a previous left greater saphenous vein stripping and I think sclerotherapy of the right greater saphenous vein. She didn't really look at the lesser saphenous vein this both wounds are on the medial aspect. She has reflux in the common femoral vein and popliteal vein and an accessory vein on the right and the common femoral vein and popliteal vein on the left. I'm going to have her go to see vein and vascular just the look over things and see if anything else beside aggressive compression is indicated here. We have not been able to make any progress on these wounds in spite of the fact that the surface of the wounds is never look too bad. 11/20/17; no major change in the condition of the wounds. Patient reports a large amount of drainage. She has a lot of complaints of pain although enlisting her today I wonder if some of this at least his neuropathic rather than secondary to her wounds. She has an appointment with vein and vascular on 12/30/17. The refractory nature of these wounds in my mind at least need vein and vascular to look over the wounds the recent reflux studies we did and her history to  see if anything further can be done here. I also note her gait is deteriorated quite a bit. 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 She has a bilateral Trendelenburg gait. I wonder if this is neuropathic or perhaps multilevel radicular. 11/27/17; her wounds actually looks slightly better. Healthy-looking granulation tissue a scant amount of epithelialization. [place] healthcare will not pay for [employer]. They will play for tri layer Oasis and Dermagraft. This is not a diabetic ulcer. We'll try for the tri layer Oasis. She still complains of some drainage. She has a vein and vascular appointment on 12/30/17 12/04/17; the wounds visually look quite good. Healthy-looking granulation with some degree of epithelialization. We are still waiting for response to our request for trial to try layer Oasis. Her appointment with vascular to review venous and arterial issues isn't sold the end of July 7/31. Not allow debridement or even vigorous cleansing of the wound surface. 12/18/17; slightly smaller especially on the right. 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 We've been using Hydrofera Blue. We still are looking into triple layer Oasis through her insurance 01/08/18 on evaluation today patient's wound actually appears to be showing signs of good improvement at this point in time. She has been tolerating the dressing changes without complication. Fortunately there does not appear to be any evidence of infection at this point in time. We have been utilizing silver nitrate which does seem to be of benefit for her which is also good news. Overall I'm very happy with how things seem to be both regards appearance as well as measurement.  Patient did see Dr. Bridgett Larsson for evaluation on 12/30/17. In his assessment he felt that stripping would not likely add much more than chronic compression to the patient's healing process. His recommendation was to follow-up in three months with  Dr. Doren Custard if she hasn't healed in order to consider referral back to you and see vascular where she previously was in a trial and was able to get her wound to heal. I'll be see what she feels she when you staying compression and he reiterated this as well. 01/13/18 on evaluation today patient appears to actually be doing very well in regard to her bilateral medial malleolus ulcers. She seems to have tolerated the chemical cauterization with silver nitrate last week she did have some pain through that evening but fortunately states that I'll be see since it seems to be doing better she is overall pleased with the progress. 01/21/18; really quite a remarkable improvement since I've last seen these wounds. We started using silver nitrate specially on the islands of hyper granulation which for some reason her around the wound circumference. This is really done quite nicely. Primary dressing Hydrofera Blue under 4 layer compression. She seems to be able to hold out without a nurse rewrap. Follow-up in 1 week 01/28/18; we've continued the hydrofera blue but continued with chemical cauterization to the wound area that we started about a month ago for irregular hyper granulation. She is made almost stunning improvement in the overall wound dimensions. I was not really expecting this degree of improvement in these chronic wounds 02/05/18; we continue with Hydrofera Bluebut of also continued the aggressive chemical cauterization with silver nitrate. We made nice progress with the right greater than left wound. 02/12/18. We continued with Hydrofera Blue after aggressive chemical cauterization with silver nitrate. We appear to be making nice progress with both wound areas 02/19/2018; we continue with [facility] after washing the wounds vigorously with Anasept spray and chemical cauterization with silver nitrate. We are making excellent progress. The area on the right's just about closed 02/26/2018. The area on the  left medial ankle had too much necrotic debris today. I used a #5 curette we are able to get most of the soft. I continued with the silver nitrate to the much smaller wound on the right medial ankle she had a new area on her right lower pretibial area which she says was due to a role in her compression 03/05/2018; both wound areas look healthy. Not much change in dimensions from last week. I continue to use silver nitrate and Hydrofera Blue. The patient saw Dr. Doren Custard of vein and vascular. He felt she had venous stasis ulcers. He felt based on her previous arteriogram she should have adequate circulation for healing. Also she has deep venous reflux but really no significant correctable superficial venous reflux at this time. He felt we should continue with conservative management including leg elevation and compression 04/02/2018; since we last saw this woman about a month ago she had a fall apparently suffered a pelvic fracture. I did not look up the x-ray. Nevertheless because of pain she literally was bedbound for 2 weeks and had home health coming out to change the dressing. Somewhat predictably this is resulted in considerable improvement in both wound areas. The right is just about closed on the medial malleolus and the left is about half the size. 04/16/2018; both her wounds continue to go down in size. Using Hydrofera Blue. 05/07/18; both her wounds appeared to be improving especially on  the right where it is almost closed. We are using Hydrofera Blue 05/14/2018; slightly worse this week with larger wounds. Surface on the left medial not quite as good. We have been using Hydrofera Blue 05/21/18; again the wounds are slightly larger. Left medial malleolus slightly larger with eschar around the circumference. We have been using Hydrofera Blue undergoing a wraps for a prolonged period of time. This got a lot better when she was more recumbent due to a fall and a back injury. I change the  primary dressing the silver alginate today. She did not tolerate a 4 layer compression previously although I may need to bring this up with her next time 05/28/2018; area on the left medial malleolus again is slightly larger with more drainage. Area on the right is roughly unchanged. She has a small area of folliculitis on the right medial just on the lower calf. This does not look ominous. 06/03/2018 left medial malleolus slightly smaller in a better looking surface. We used silver nitrate on this last time with silver alginate. The area on the right appears slightly smaller 1/10; left medial malleolus slightly smaller. Small open area on the right. We used silver nitrate and silver alginate as of 2 weeks ago. We continue with the wound and compression. These got a lot better when she was off her feet 1/17; right medial malleolus wound is smaller. The left may be slightly smaller. Both surfaces look somewhat better. 1/24; both wounds are slightly smaller. Using silver alginate under Unna boots 1/31; both wounds appear smaller in fact the area on the right medial is just about closed. Surface eschar. We have been using silver alginate under Unna boots. The patient is less active now spends let much less time on her feet and I think this is contributed to the general improvement in the wound condition 2/7; both wounds appear smaller. I was hopeful the right medial would be closed however there there is still the same small open area. Slight amount of surface eschar on the left the dimensions are smaller there is eschar but the wound edges appear to be free. We have been using silver alginate under Unna boot's 2/14; both wounds once again measure smaller. Circumferential eschar on the left medial. We have been using silver alginate under Unna boots with gradual improvement 2/21; the area on the right medial malleolus has healed. The area on the left is smaller. We have been using silver alginate and Unna  boots. We can discharge wrapping the right leg she has 20/30 stockings at home she will need to protect the scar tissue in this area 2/28; the area on the right medial malleolus remains closed the patient has a compression stocking. The area on the left is smaller. We have been using silver alginate and Unna boots. 3/6 the area on the right medial ankle remains closed. Good edema control noted she is using her own compression stocking. The area on the left medial ankle is smaller. We have been managing this with silver alginate and Unna boots which we will continue today. 3/13; the area on the right medial ankle remains closed and I'm declaring it healed today. When necessary the left is about the same still a healthy-looking surface but no major change and wound area. No evidence of infection and using silver alginate under unna and generally making considerable improvement 3/27 the area on the right medial ankle remains closed the area on the left is about the same as last week. Certainly not  any worse we have been using silver alginate under an Unna boot 4/3; the area on the right medial ankle remains closed per the patient. We did not look at this wound. The wound on the left medial ankle is about the same surface looks healthy we have been using silver alginate under an Unna boot 4/10; area on the right medial ankle remains closed per the patient. We did not look at this wound. The wound on the left medial ankle is slightly larger. The patient complains that the [facility] caused burning pain all week. She also told us that she was a lot more active this week. Changed her back to silver alginate 4/17; right medial ankle still closed per the patient. Left medial ankle is slightly larger. Using silver alginate. She did not tolerate Hydrofera Blue on this area 4/24; right medial ankle remains closed we have not look at this. The left medial ankle continues to get larger today by about a  centimeter. We have been using silver alginate under Unna boots. She complains about 4 layer compression as an alternative. She has been up on her feet working on her garden 5/8; right medial ankle remains closed we did not look at this. The left medial ankle has increased in size about 100%. We have been using silver alginate under Unna boots. She noted increased pain this week and was not surprised that the wound is deteriorated 5/15; no major change in SA however much less erythema ( one week of doxy ocellulitis). 5/22-65 year old female returns at 1 week to the clinic for left medial ankle wound for which we have been using silver alginate under 3 layer compression She was placed on DOXY at last visit - the wound is wider at this visit. She is in 3 layer compression 5/29; change to [facility] last week. I had given her empiric doxycycline 2 weeks ago for a week. She is in 3 layer compression. She complains of a lot of pain and drainage on presentation today. 6/5; using Hydrofera Blue. I gave her doxycycline recently empirically for erythema and pain around the wound. Believe her cultures showed enterococcus which not would not have been well covered by doxycycline nevertheless the wound looks better and I don't feel specifically that the enterococcus needs to be covered. She has a new what looks like a wrap injury on her lateral left ankle. 6/12; she is using Hydrofera Blue. She has a new area on the left anterior lower tibial area. This was a wrap injury last week. 6/19; the patient is using Hydrofera Blue. She arrived with marked inflammation and erythema around the wound and tenderness. 12/01/18 on evaluation today patient appears to be doing a little bit better based on what I'm hearing from the standpoint of lassos evaluation to this as far as the overall appearance of the wound is concerned. Then sometime substandard she typically sees Dr. Dellia Nims. Nonetheless overall very pleased with the  progress that she's made up to this point. No fevers, chills, nausea, or vomiting noted at this time. 7/10; some improvement in the surface area. Aggressively debrided last week apparently. I went ahead with the debridement today although the patient does not tolerate this very well. We have been using Iodoflex. Still a fair amount of drainage 7/17; slightly smaller. Using Iodoflex. 7/24; no change from last week in terms of surface area. We have been using Iodoflex. Surface looks and continues to look somewhat better 7/31; surface area slightly smaller better looking surface. We  have been using Iodoflex. This is under Unna boot compression 8/7-Patient presents at 1 week with Unna boot and Iodoflex, wound appears better 8/14-Patient presents at 1 week with Iodoflex, we use the Unna boot, wound appears to be stable better.Patient is getting Botox treatment for the inversion of the foot for tendon release, Next week 8/21; we are using Iodoflex. Unna boot. The wound is stable in terms of surface area. Under illumination there is some areas of the wound that appear to be either epithelialized or perhaps this is adherent slough at this point I was not really clear. It did not wipe off and I was reluctant to debride this today. 8/28; we are using Iodoflex in an Unna boot. Seems to be making good improvement. 9/4; using Iodoflex and wound is slightly smaller. 9/18; we are using Iodoflex with topical silver nitrate when she is here. The wound continues to be smaller 10/2; patient missed her appointment last week due to GI issues. She left and Iodoflex based dressing on for 2 weeks. Wound is about the same size about the size of a dime on the left medial lower 10/9 we have been using Iodoflex on the medial left ankle wound. She has a new superficial probable wrap injury on the dorsal left ankle 10/16; we have been using Hydrofera Blue since last week. This is on the left medial ankle 10/23; we have been  using Hydrofera Blue since 2 weeks ago. This is on the left medial ankle. Dimensions are better 11/6; using Hydrofera Blue. I think the wound is smaller but still not closed. Left medial ankle 11/13; we have been using Hydrofera Blue. Wound is certainly no smaller this week. Also the surface not as good. This is the remanent of a very large area on her left medial ankle. 11/20; using Sorbact since last week. Wound was about the same in terms of size although I was disappointed about the surface debris 12/11; 3-week follow-up. Patient was on vacation. Wound is measuring slightly larger we have been using Sorbact. 12/18; wound is about the same size however surface looks better last week after debridement. We have been using Sorbact under compression 1/15 wound is probably twice the size of last time increased in length nonviable surface. We have been using Sorbact. She was running a mild fever and missed her appointment last week 1/22; the wound is come down in size but under illumination still a very adherent debris we have been Hydrofera Blue that I changed her to last week 1/29; dimensions down slightly. We have been using Hydrofera Blue 2/19 dimensions are the same however there is rims of epithelialization under illumination. Therefore more the surface area may be epithelialized 2/26; the patient's wound actually measures smaller. The wound looks healthy. We have been using Hydrofera Blue. I had some thoughts about running Apligraf then I still may do that however this looks so much better this week we will delay that for now 3/5; the wound is small but about the same as last week. We have been using Hydrofera Blue. No debridement is required today. 3/19; the wound is about the size of a dime. Healthy looking wound even under illumination. We have been using Hydrofera Blue. No mechanical debridement is necessary 3/26; not much change from last week although still looks very healthy. We have been  using Hydrofera Blue under Unna boots Patient was offered an ankle fusion by podiatry but not until the wound heals with a proceed with this. 4/9; the patient  comes in today with her original wound on the medial ankle looking satisfactory however she has some uncontrolled swelling in the middle part of her leg with 2 new open areas superiorly just lateral to the tibia. I think this was probably a wrap issue. She said she felt uncomfortable during the week but did not call in. We have been using Hydrofera Blue 4/16; the wound on the medial ankle is about the same. She has innumerable small areas superior to this across her mid tibia. I think this is probably folliculitis. She is also been working in the yard doing a lot of sweating 4/30; the patient issue on the upper areas across her mid tibia of all healed. I think this was excessive yard work if I remember. Her wound on the medial ankle is smaller. Some debris on this we have been using Hydrofera Blue under Unna boots 5/7; mid tibia. She has been using Hydrofera Blue under an Unna wrap. She is apparently going for her ankle surgery on June 3 10/28/19-Patient returns to clinic with the ankle wound, we are using Hydrofera Blue under Unna wrap, surgery is scheduled for her left foot for June 3 so she will be back for nurse visit next week READMISSION 01/17/2020 Mrs. Piercefield is a 65 year old woman we have had in this clinic for a long period of time with severe venous hypertension and refractory wounds on her medial lower legs and ankles bilaterally. 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 When she was off her legs for a prolonged period example when she fell and suffered a compression fracture things would heal up quite nicely. She is now retired and we managed to heal up the right medial leg wound. The left one was very tiny  last time I saw this although still refractory. 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 It got to the point where this was interfering with her walking and she elected to proceed with a ankle arthrodesis to straighten her her ankle and leave her with a functional outcome for mobilization. The patient was referred to Dr. Doren Custard and really this took some time to arrange. Dr. Doren Custard saw her on 12/07/2019. Once again he verified that she had no arterial issues. She had previously had an angiogram several years ago. Follow-up ABIs on the left showed an ABI of 1.12 with triphasic waveforms and a TBI of 0.92. She is felt to have chronic deep venous insufficiency but I do not think it was felt that anything could be done from about this from an ablation point of view. At the time Dr. Doren Custard saw this patient the wounds actually look closed via the pictures in his clinic. The patient finally underwent her surgery on 12/15/2019. This went reasonably well and there was a good anatomic outcome. She developed a small distal wound dehiscence on the lateral part of the surgical wound. However more problematically she is developed recurrence of the wound on the medial left ankle. There are actually 2 wounds here one in the distal lower leg and 1 pretty much at the level of the medial malleolus. It is a more distal area that is more problematic. She has been using Hydrofera Blue which started on Friday before this she was simply Ace wrapping. There was a culture  done that showed Pseudomonas and she is on ciprofloxacin. A recent CNS on 8/11 was negative. The patient reports some pain but I generally think this is improving. She is using a cam boot completely nonweightbearing using a walker for pivot transfers and a wheelchair 8/24; not much improvement unfortunately she has a surgical wound on the lateral part in the venous  insufficiency wound medially. The bottom part of the medial insufficiency wound is still necrotic there is exposed tendon here. We have been using Hydrofera Blue under compression. Her edema control is however better 8/31; patient in for follow-up of his surgical wound on the lateral part of her left leg and chronic venous insufficiency ulcers medially. We put her back in compression last week. She comes in today with a complaint of 3 or 4 days worth of increasing pain. She felt her cam walker was rubbing on the area on the back of her heel. However there is intense erythema seems more likely she has cellulitis. She had 2 cultures done when she was seeing podiatry in the postop. One of them in late July showed Pseudomonas and she received a course of ciprofloxacin the other was negative on 8/11 she is allergic to penicillin with anaphylactoid complaints of hives oral swelling via information in epic 9/9; when I saw this patient last week she had intense anterior erythema around her wound on the right lateral heel and ankle and also into the right medial heel. Some of this was no doubt drainage and her walker boot however I was convinced she had cellulitis. I gave her Levaquin and Bactrim she is finishing up on this now. She is following up with Dr. [name] he saw her yesterday. He is taken her out of the walking boot of course she is still nonweightbearing. Her x-ray was negative for any worrisome features such as soft tissue air etc. Things are a lot better this week. She has home health. We have been using Hydrofera Blue under an [employer] which she put back on yesterday. I did not wrap her last week 9/17; her surrounding skin looks a lot better. In fact the area on the left lateral ankle has just a scant amount of eschar. The only remaining wound is the large area on the left medial ankle. Probably about 60% of this is healthy granulation at the surface however she has a significant divot distally.  This has adherent debris in it. I been using debridement and silver collagen to try and get this area to fill-in although I do not think we have made much progress this week 9/24; the patient's wound on the left medial ankle looks a lot better. The deeper divot area distally still requires debridement but this is cleaning up quite nicely we have been using silver collagen. The patient is complaining of swelling in her foot and is worried that that is contributing to the nonhealing of the ankle wound. She is also complaining of numbness in her anterior toes 10/4; left medial ankle. The small area distally still has a divot with necrotic material that I have been debriding away. This has an undermining area. She is approved for Apligraf. She saw Dr. [name] her surgeon on 10/1. I think he declared himself is satisfied with the condition of things. Still nonweightbearing till the end of the month. 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 Her surgical wound is well closed. There is no evidence of infection 10/11; the patient arrived in clinic  today with the expectation that we be able to put an Apligraf on this area after debridement however she arrives with a relatively large amount of green drainage on the dressing. The patient states that this started on Friday. She has not been systemically unwell. 10/19; culture I did last week showed both Enterococcus and Pseudomonas. I think this came in separate parts because I stopped her ciprofloxacin I gave her and prescribed her linezolid however now looking at the final culture result this is Pseudomonas which is resistant to quinolones. She has not yet picked up the linezolid apparently phone issues. We are also trying to get a topical antibiotic out of [place] in [place] they can be applied by home health. She is still having green drainage 10/16; the patient has her topical antibiotic from [facility] in  [place]. This is a compounded gel with vancomycin and ciprofloxacin and gentamicin. We are applying this on the wound bed with silver alginate over the top with Unna boot wraps. She arrives in clinic today with a lot less ominous looking drainage although she is only use this topical preparation once the second time today. She sees Dr. [name] her surgeon on Friday she has home health changing the dressing 11/2; still using her compounded topical antibiotic under silver alginate. Surface is cleaning up there is less drainage. We had an Apligraf for her today and I elected to apply it. A light coating of her antibiotic 04/25/2020 upon evaluation today patient appears to be doing well with regard to her ankle ulcer. There is a little bit of slough noted on the surface of the wound I am can have to perform sharp debridement to clear this away today. With that being said other than that fact overall I feel like she is making progress and we do see some new epithelial growth. There is also some improvement in the depth of the wound and that distal portion. There is little bit of slough there as well. 12/7; 2-week follow-up. Apligraf #3. Dimensions are smaller. Closing in especially inferiorly. Still some surface debris. Still using the [facility] topical antibiotic but I told her that I don't think this needs to be renewed 12/21; 2-week follow-up. Apligraf #4 dimensions are smaller. Nice improvement 06/05/2020; 2-week follow-up. The patient's wound on the left medial ankle looks really excellent. Nice granulation. Advancing epithelialization no undermining no evidence of infection. We would have to reapply for another Apligraf but with the condition of this wound I did not feel strongly about it. We used Hydrofera Blue under the same degree of compression. She follows up with Dr. [name] her surgeon a week Friday 06/13/2020 upon evaluation today patient appears to be doing excellent in regard to her wound. She has  been tolerating the dressing changes without complication. Fortunately there is no signs of active infection at this time. No fevers, chills, nausea, vomiting, or diarrhea. She was using Hydrofera Blue last week. 06/20/2020 06/20/2020 on evaluation today patient actually appears to be doing excellent in regard to her wound. This is measuring better and looking much better as well. She has been using the collagen that seems to be doing better for her as well even though the [facility] was and is not sticking or feeling as rough on her wound. She did see Dr. [name] on Friday he is very pleased he also stated none of the hardware has shifted. That is great news 1/27; the patient has a small clean wound all that is remaining. I agree  that this is too small to really consider an Apligraf. Under illumination the surface is looking quite good. We have been using collagen although the dimensions are not any better this week 2/2; the patient has a small clean wound on the left medial ankle. Although this left of her substantial original areas. Measurements are smaller. We have been using polymen Ag under an Haematologist. 2/10; small area on the left medial ankle. This looks clean nothing to debride however dimensions are about the same we have been using polymen I think now for 2 weeks 2/17; not much change in surface area. We have been using polymen Ag without any improvement. 3/17; 1 month follow-up. The patient has been using endoform without any improvement in fact I think this looks worse with more depth and more expansion 3/24; no improvement. Perhaps less debris on the surface. We have been using Sorbact for 1 week 4/4; wound measures larger. She has edema in her leg and her foot which she tells as her wrap came down. We have been using Unna boots. Sorbact of the wound. She has been approved for Apligraf 09/12/2020 upon evaluation today patient appears to be doing well with regard to her wound. We did  get the Apligraf reapproved which is great news we have that available for application today. Fortunately there is no signs of infection and overall the patient seems to be doing great. The wound bed is nice and clean. 4/27; patient presents for her second application of Apligraf. She states over the past week she has been on her feet more often due to being outside in her garden. She has noted more swelling to her foot as a result. She denies increased warmth, pain or erythema to the wound site. 10/10/2020 upon evaluation today patient appears to be doing well with regard to her wound which does not appear to be quite as irritated as last week from what I am hearing. With that being said unfortunately she is having issues with some erythema and warmth to touch as well as an increase size. I do believe this likely is infected. 10/17/2020 upon evaluation today patient appears to be doing excellent in regard to her wound this is significantly improved as compared to last week. Fortunately I think that the infection is much better controlled at this point. She did have evidence of both Enterococcus as well as Staphylococcus noted on culture. Enterococcus really would not be helped significantly by the Cipro but the wound is doing so much better I am under the assumption that the Staphylococcus is probably the main organism that is causing the current infection. Nonetheless I think that she is doing excellent as far as that is concerned and I am very pleased in that regard. I would therefore recommend she continue with the Cipro. 10/31/2020 upon evaluation today patient appears to be doing well with regard to her wound. She has been tolerating the dressing changes without complication. Fortunately there is no signs of active infection and overall I am extremely pleased with where things stand today. No fevers, chills, nausea, vomiting, or diarrhea. With that being said she does have some green drainage coming  from the wound and although it looks okay I am a little concerned about the possibility of a continuing infection. Specifically with Pseudomonas. For that reason I will go ahead and send in a prescription for Cipro for her to be continued. 11/14/2020 upon evaluation today patient appears to be doing very well currently in regard to  her wound on her leg. She has been tolerating the dressing changes without complication. Fortunately I feel like the infection is finally under good control here. Unfortunately we do not have the Apligraf for application today although we can definitely order to have it in place for next week. That will be her fifth and final of the current series. Nonetheless I feel like her wound is really doing quite well which is great news. 11/21/2020 upon evaluation today patient appears to be doing well with regard to her wound on the medial ankle. Fortunately I think the infection is under control and I do believe we can go ahead and reapply the Apligraf today. She is in agreement with that plan. There does not appear to be any signs of active infection at this time which is great news. No fevers, chills, nausea, vomiting, or diarrhea. 12/05/2020 upon evaluation today patient's wound bed actually showed signs of good granulation epithelization at this point. There does not appear to be any signs of infection which is great news and overall very pleased with where things stand. Overall the patient seems to be doing fairly well in my opinion with regard to her wound although I do believe she continues to build up a lot of biofilm I think she could benefit from using PuraPly at this point. 12/12/2020 upon evaluation today patient's wound actually appears to be doing decently well today. The Unna boot has not been quite as well-tolerated so that more uncomfortable for her and even causing some pressure over the plate on the lateral portion of her foot which is 90 where the wound is. There did  not appear to be any significant deep tissue injury with that there may be a minimal change in the skin noted I think that we may want to go back to the Coflex 2 layer which is a little bit easier on her skin it seems. 12/19/2020 upon evaluation today patient actually seems to be making great progress with the PuraPly currently. She in fact seems to be much better as far as the overall appearance of the wound bed is concerned I am very happy in this regard. I do not see any signs of of infection which is great news as well. No fevers, chills, nausea, vomiting, or diarrhea. 12/26/2020 upon evaluation today patient appears to actually be doing better in regard to her wound on the left medial ankle region. The surface of the wound is actually doing significantly better which is great news. There does not appear to be any signs of infection which is also great news and in general I am extremely pleased with where we stand today. 01/02/2021 upon evaluation today patient appears to be doing well with regard to her wound. In fact this is showing signs of excellent improvement and very pleased with where things stand. In fact the last 3 appointments have all shown signs of this getting smaller which is excellent news. I have not even had to perform any debridement and today is no exception. Overall I feel like this is dramatically improved compared to previous. T oday is PuraPly application #4. [id number] upon evaluation today patient appears to be doing excellent in regard to her wound this is continue to show signs of improvement and overall I am extremely pleased with where we stand today. She is actually here for PuraPly application #5. Every time we have applied this she is noted definite improvement on measurements. 01/23/2021 upon evaluation today patient is actually making good progress  in regard to her wound. This was actually on just a little bit longer this time compared to previous due to the fact that  she did have to go out of town. She is actually here for PuraPly application #6. We have definitely been seeing improvements in the overall quality of the tissue on the surface of the wound which is awesome news. In general I think that the patient seems to be continuing to make great progress here. 01/30/2021 upon evaluation today patient's wound is actually doing excellent. There is really not any significant biofilm buildup which is great news and overall I am extremely pleased with where things stand today. There does not appear to be any signs of active infection. No fevers, chills, nausea, vomiting, or diarrhea. Electronic Signature(s) Signed: 01/30/2021 11:05:53 AM By: [name] PA-C Signed: 01/30/2021 11:05:53 AM By: [name] PA-C Entered By: [name] on 01/30/2021 11:05:52 -------------------------------------------------------------------------------- Physical Exam Details Patient Name: Date of Service: [name], [name] 01/30/2021 10:00 A M Medical Record Number: [id number] Patient Account Number: [id number] Date of Birth/Sex: Treating RN: 12-31-1955 (65 y.o. [name] Provider: [name] Other Clinician: Referring Provider: Treating Provider/Extender: [name] in Treatment: 74 Constitutional Well-nourished and well-hydrated in no acute distress. Respiratory normal breathing without difficulty. Psychiatric this patient is able to make decisions and demonstrates good insight into disease process. Alert and Oriented x 3. pleasant and cooperative. Notes Upon inspection patient's wound bed showed signs of good granulation epithelization at this point. Fortunately there does not appear to be any signs of infection in fact she had a minimal amount of slough removed with mechanical debridement using saline and gauze. Overall I do not think any sharp debridements even necessary. I do think that she is a prime  candidate to continue with the PuraPly since she is making progress here I think we should continue as such. I did actually go ahead and reapply the PuraPly today secured with Adaptic no Steri-Strips I did put some triamcinolone around the edges of the wound as well to help with itching as she has been having quite a bit of itching. Electronic Signature(s) Signed: 01/30/2021 11:06:38 AM By: [name] PA-C Entered By: [name] on 01/30/2021 11:06:37 -------------------------------------------------------------------------------- Physician Orders Details Patient Name: Date of Service: [name], [name] 01/30/2021 10:00 A M Medical Record Number: [id number] Patient Account Number: [id number] Date of Birth/Sex: Treating RN: 1956-02-05 (64 y.o. [name] Primary Care Provider: [name] Other Clinician: Referring Provider: Treating Provider/Extender: [name] in Treatment: 40 Verbal / Phone Orders: No Diagnosis Coding ICD-10 Coding Code Description [phone] Chronic venous hypertension (idiopathic) with ulcer and inflammation of left lower extremity L97.828 Non-pressure chronic ulcer of other part of left lower leg with other specified severity L97.328 Non-pressure chronic ulcer of left ankle with other specified severity Follow-up Appointments ppointment in 1 week. - with Margarita Grizzle Return A Cellular or Tissue Based Products Cellular or Tissue Based Product Type: - PuraPly: #6, 01/30/21 PuraPly #7 daptic or Mepitel. (DO NOT REMOVE). - no Cellular or Tissue Based Product applied to wound bed, secured with steri-strips, cover with A steristrips Bathing/ Shower/ Hygiene May shower with protection but do not get wound dressing(s) wet. Edema Control - Lymphedema / SCD / Other Elevate legs to the level of the heart or above for 30 minutes daily and/or when sitting, a frequency of: - throughout the  day Avoid standing for long periods of  time. Exercise regularly Compression stocking or Garment 20-30 mm/Hg pressure to: - right leg daily Additional Orders / Instructions Follow Nutritious Diet Wound Treatment Wound #15 - Malleolus Wound Laterality: Left, Medial Cleanser: Soap and Water 1 x Per Week/30 Days Discharge Instructions: May shower and wash wound with dial antibacterial soap and water prior to dressing change. Peri-Wound Care: Triamcinolone 15 (g) 1 x Per Week/30 Days Discharge Instructions: Use triamcinolone 15 (g) as directed Peri-Wound Care: Sween Lotion (Moisturizing lotion) 1 x Per Week/30 Days Discharge Instructions: Apply moisturizing lotion as directed Prim Dressing: PuraPly AM ary 1 x Per Week/30 Days Secondary Dressing: Woven Gauze Sponge, Non-Sterile 4x4 in 1 x Per Week/30 Days Discharge Instructions: to bolster skin sub Secondary Dressing: ABD Pad, 5x9 1 x Per Week/30 Days Discharge Instructions: Apply over primary dressing and lateral ankle Secondary Dressing: Optifoam Non-Adhesive Dressing, 4x4 in 1 x Per Week/30 Days Discharge Instructions: Apply over primary dressing pad lateral foot with foam donut Secondary Dressing: ADAPTIC TOUCH 3x4.25 in 1 x Per Week/30 Days Discharge Instructions: Apply over skin sub Compression Wrap: CoFlex TLC XL 2-layer Compression System 4x7 (in/yd) 1 x Per Week/30 Days Discharge Instructions: Apply CoFlex 2-layer compression as directed. (alt for 4 layer) Electronic Signature(s) Signed: 01/30/2021 5:21:16 PM By: [name] PA-C Signed: 01/30/2021 5:28:58 PM By: [name] Entered By: [name] on 01/30/2021 13:07:58 -------------------------------------------------------------------------------- Problem List Details Patient Name: Date of Service: [name], [name] 01/30/2021 10:00 A M Medical Record Number: [id number] Patient Account Number: [id number] Date of Birth/Sex: Treating RN: 12-21-1955 (65 y.o. [name] Primary Care Provider: [name] Other Clinician: Referring Provider: Treating Provider/Extender: [name] in Treatment: 57 Active Problems ICD-10 Encounter Code Description Active Date MDM Diagnosis I87.332 Chronic venous hypertension (idiopathic) with ulcer and inflammation of left 01/17/2020 No Yes lower extremity L97.828 Non-pressure chronic ulcer of other part of left lower leg with other specified 01/17/2020 No Yes severity L97.328 Non-pressure chronic ulcer of left ankle with other specified severity 01/17/2020 No Yes Inactive Problems ICD-10 Code Description Active Date Inactive Date L03.116 Cellulitis of left lower limb 01/31/2020 01/31/2020 T81.31XD Disruption of external operation (surgical) wound, not elsewhere classified, subsequent 01/17/2020 01/17/2020 encounter Resolved Problems Electronic Signature(s) Signed: 01/30/2021 10:52:01 AM By: [name] PA-C Entered By: [name] on 01/30/2021 10:52:01 -------------------------------------------------------------------------------- Progress Note Details Patient Name: Date of Service: [facility] [name], [name] 01/30/2021 10:00 A M Medical Record Number: [id number] Patient Account Number: [id number] Date of Birth/Sex: Treating RN: 11-Jan-1956 (65 y.o. [name] Provider: [name] Other Clinician: Referring Provider: Treating Provider/Extender: [name] in Treatment: 60 Subjective Chief Complaint Information obtained from Patient patient is been followed long-term in this clinic for venous insufficiency ulcers with inflammation, hypertension and ulceration over the medial ankle bilaterally. 01/17/2020; this is a patient who is here for review of postoperative wounds on the left lateral ankle and recurrence of venous stasis ulceration on the left medial History of Present Illness (HPI) the remaining wound is over the left medial ankle. Similar wound over the right  medial ankle healed largely with use of Apligraf. Most recently we have been using Hydrofera Blue over this wound with considerable improvement. The patient has been extensively worked up in the past for her venous insufficiency and she is not a candidate for antireflux surgery although I have none of the details available  currently. 08/24/14; considerable improvement today. About 50% of this wound areas now epithelialized. The base of the wound appears to be healthier granulation.as opposed to last week when she had deteriorated a considerable improvement 08/17/14; unfortunately the wound has regressed somewhat. The areas of epithelialization from the superior aspect are not nearly as healthy as they were last week. The patient thinks her Hydrofera Blue slipped. 09/07/14; unfortunately the area has markedly regressed in the 2 weeks since I've seen this. There is an odor surrounding erythema. The healthy granulation tissue that we had at the base of the wound now is a dusky color. The nurse reports green drainage 09/14/14; the area looks somewhat better than last week. There is less erythema and less drainage. The culture I did did not show any growth. Nevertheless I think it is better to continue the Cipro and doxycycline for a further week. The remaining wound area was debridement. 09/21/14. Wound did not require debridement last week. Still less erythema and less drainage. She can complete her antibiotics. The areas of epithelialization in the superior aspect of the wound do not look as healthy as they did some weeks ago 10/05/14 continued improvement in the condition of this wound. There is advancing epithelialization. Less aggressive debridement required 10/19/14 continued improvement in the condition and volume of this wound. Less aggressive debridement to the inferior part of this to remove surface slough and fibrinous eschar 11/02/14 no debridement is required. The surface granulation appears healthy  although some of her islands of epithelialization seem to have regressed. No evidence of infection 11/16/14; lites surface debridement done of surface eschar. The wound does not look to be unhealthy. No evidence of infection. Unfortunately the patient has had podiatry issues in the right foot and for some reason has redeveloped small surface ulcerations in the medial right ankle. Her original presentation involved wounds in this area 11/23/14 no debridement. The area on the right ankle has enlarged. The left ankle wound appears stable in terms of the surface although there is periwound inflammation. There has been regression in the amount of new skin 11/30/14 no debridement. Both wound areas appear healthy. There was no evidence of infection. The the new area on the right medial ankle has enlarged although that both the surfaces appear to be stable. 12/07/14; Debridement of the right medial ankle wound. No no debridement was done on the left. 12/14/14 no major change in and now bilateral medial ankle wounds. Both of these are very painful but the no overt evidence of infection. She has had previous venous ablation 12/21/14; patient states that her right medial ankle wound is considerably more painful last week than usual. Her left is also somewhat painful. She could not tolerate debridement. The right medial ankle wound has fibrinous surface eschar 12/28/14 this is a patient with severe bilateral venous insufficiency ulcers. For a considerable period of time we actually had the one on the right medial ankle healed however this recently opened up again in June. The left medial ankle wound has been a refractory area with some absent flows. We had some success with Hydrofera Blue on this area and it literally closed by 50% however it is recently opened up Foley. Both of these were debridement today of surface eschar. She tolerates this poorly 01/25/15: No change in the status of this. Thick adherent escar.  Very poor tolerance of any attempt at debridement. I had healed the right medial malleolus wound for a considerable amount of time and had the left one  down to about 50% of the volume although this is totally regressed over the last 48 weeks. Further the right leg has reopened. she is trying to make a appointment with pain and vascular, previous ablations with Dr. Aleda Grana. I do not believe there is an arterial insufficiency issue here 02/01/15 the status of the adherent eschar bilaterally is actually improved. No debridement was done. She did not manage to get vascular studies done 02/08/15 continued debridement of the area was done today. The slough is less adherent and comes off with less pressure. There is no surrounding infection peripheral pulses are intact 02/15/15 selective debridement with a disposable curette. Again the slough is less adherent and comes off with less difficulty. No surrounding infection peripheral pulses are intact. 02/22/15 selective debridement of the right medial ankle wound. Slough comes off with less difficulty. No obvious surrounding infection peripheral pulses are intact I did not debridement the one on the left. Both of these are stable to improved 03/01/15 selective debridement of both wound areas using a curette to. Adherent slough cup soft with less difficulty. No obvious surrounding infection. The patient tells me that 2 days ago she noted a rash above the right leg wrap. She did not have this on her lower legs when she change this over she arrives with widespread left greater than right almost folliculitis-looking rash which is extremely pruritic. I don't see anything to culture here. There is no rash on the rest of her body. She feels well systemically. 03/08/15; selective debridement of both wounds using a curette. Base of this does not look unhealthy. She had limegreen drainage coming out of the left leg wound and describes a lot of drainage. The rash on her  left leg looks improved to. No cultures were done. 03/22/15; patient was not here last week. Basal wounds does not look healthy and there is no surrounding erythema. No drainage. There is still a rash on the left leg that almost looks vasculitic however it is clearly limited to the top of where the wrap would be. 04/05/15; on the right required a surgical debridement of surface eschar and necrotic subcutaneous tissue. I did not debridement the area on the left. These continue to be large open wounds that are not changing that much. We were successful at one point in healing the area on the right, and at the same time the area on the left was roughly half the size of current measurements. I think a lot of the deterioration has to do with the prolonged time the patient is on her feet at work 04/19/15 I attempted-like surface debridement bilaterally she does not tolerate this. She tells me that she was in allergic care yesterday with extreme pain over her left lateral malleolus/ankle and was told that she has an "sprain" 05/03/15; large bilateral venous insufficiency wounds over the medial malleolus/medial aspect of her ankles. She complains of copious amounts of drainage and his usual large amounts of pain. There is some increasing erythema around the wound on the right extending into the medial aspect of her foot to. historically she came in with these wounds the right one healed and the left one came down to roughly half its current size however the right one is reopened and the left is expanded. 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 05/10/15 large bilateral venous insufficiency wounds. There is less adherence surface left however the surface culture that I  did last week grew pseudomonas therefore bilateral selective debridement score necessary. There is surrounding erythema. The patient describes severe bilateral drainage and a lot of pain in the  left ankle. Apparently her podiatrist was were ready to do a cortisone shot 05/17/15; the patient complains of pain and again copious amounts of drainage. 05/24/15; we used Iodo flex last week. Patient notes considerable improvement in wound drainage. Only needed to change this once. 05/31/15; we continued Iodoflex; the base of these large wounds bilaterally is not too bad but there is probably likely a significant bioburden here. I would like to debridement just doesn't tolerate it. 06/06/14 I would like to continue the Iodoflex although she still hasn't managed to obtain supplies. She has bilateral medial malleoli or large wounds which are mostly superficial. Both of them are covered circumferentially with some nonviable fibrinous slough although she tolerates debridement very poorly. She apparently has an appointment for an ablation on the right leg by interventional radiology. 06/14/15; the patient arrives with the wounds and static condition. We attempted a debridement although she does not do well with this secondary to pain. I 07/05/15; wounds are not much smaller however there appears to be a cleaner granulating base. The left has tight fibrinous slough greater than the right. Debridement is tolerated poorly due to pain. Iodoflex is done more for these wounds in any of the multitude of different dressings I have tried on the left 1 and then subsequently the right. 07/12/15; no change in the condition of this wound. I am able to do an aggressive debridement on the right but not the left. She simply cannot tolerate it. We have been using Iodoflex which helps somewhat. It is worthwhile remembering that at one point we healed the right medial ankle wound and the left was about 25% of the current circumference. We have suggested returning to vascular surgery for review of possible further ablations for one reason or another she has not been able to do this. 07/26/15 no major change in the condition of  either wound on her medial ankle. I did not attempt to debridement of these. She has been aggressively scrubbing these while she is in the shower at home. She has her supply of Iodoflex which seems to have done more for these wounds then anything I have put on recently. 08/09/15 wound area appears larger although not verified by measurements. Using Iodoflex 09/05/2015 -- she was here for avisit today but had significant problems with the wound and I was asked to see her for a physician opinion. I have summarize that this lady has had surgery on her left lower extremity about 10 years ago where the possible veins stripping was done. She has had an opinion from interventional radiology around November 2016 where no further sclerotherapy was ordered. The patient works 12 hours a day and stands on a concrete floor with work boots and is unable to get the proper compression she requires and cannot elevate her limbs appropriately at any given time. She has recently grown Pseudomonas from her wound culture but has not started her ciprofloxacin which was called in for her. 09/13/15 this continues to be a difficult situation for this patient. At one point I had this wound down to a 1.5 x 1.5" wound on her left leg. This is deteriorated and the right leg has reopened. She now has substantial wounds on her medial calcaneus, malleoli and into her lower leg. One on the left has surface eschar but these are far  too painful for me to debridement here. She has a vascular surgery appointment next week to see if anything can be done to help here. I think she has had previous ablations several years ago at [facility]. She has no major edema. She tells me that she did not get product last time [facility]] and went for several days without it. She continues to work in work boots 12 hours a day. She cannot get compression/4-layer under her work boots. 09/20/15 no major change. Periwound edema control was not very good. Her  point with pain and vascular is next Wednesday the 25th 09/28/15; the patient is seen vascular surgery and is apparently scheduled for repeat duplex ultrasounds of her bilateral lower legs next week. 10/05/15; the patient was seen by Dr. Doren Custard of vascular surgery. He feels that she should have arterial insufficiency excluded as cause/contributed to her nonhealing stage she is therefore booked for an arteriogram. She has apparently monophasic signals in the dorsalis pedis pulses. She also of course has known severe chronic venous insufficiency with previous procedures as noted previously. I had another long discussion with the patient today about her continuing to work 12 hour shifts. I've written her out for 2 months area had concerns about this as her work location is currently undergoing significant turmoil and this may lead to her termination. She is aware of this however I agree with her that she simply cannot continue to stand for 12 hours multiple days a week with the substantial wound areas she has. 10/19/15; the Dr. Doren Custard appointment was largely for an arteriogram which was normal. She does not have an arterial issue. He didn't make a comment about her chronic venous insufficiency for which she has had previous ablations. Presumably it was not felt that anything additional could be done. The patient is now out of work as I prescribed 2 weeks ago. Her wounds look somewhat less aggravated presumably because of this. I felt I would give debridement another try today 10/25/15; no major change in this patient's wounds. We are struggling to get her product that she can afford into her own home through her insurance. 11/01/15; no major change in the patient's wounds. I have been using silver alginate as the most affordable product. I spoke to Dr. Marla Roe last week with her requested take her to the OR for surgical debridement and placement of ACEL. Dr. Marla Roe told me that she would be willing to do  this however [facility] will not cover this, fortunately the patient has [place] healthcare of some variant 11/08/15; no major change in the patient's wounds. She has been completely nonviable surface that this but is in too much pain with any attempted debridement are clinic. I have arranged for her to see Dr. Marla Roe ham of plastic surgery and this appointment is on Monday. I am hopeful that they will take her to the OR for debridement, possible ACEL ultimately possible skin graft 11/22/15 no major change in the patient's wounds over her bilateral medial calcaneus medial malleolus into the lower legs. Surface on these does not look too bad however on the left there is surrounding erythema and tenderness. This may be cellulitis or could him sleepy tinea. 11/29/15; no major changes in the patient's wounds over her bilateral medial malleolus. There is no infection here and I don't think any additional antibiotics are necessary. There is now plan to move forward. She sees Dr. Marla Roe in a week's time for preparation for operative debridement and  ACEL placement I believe on 7/12. She then has a follow-up appointment with Dr. Marla Roe on 7/21 12/28/15; the patient returns today having been taken to the [facility] by Dr. Marla Roe 12/12/15 she underwent debridement, intraoperative cultures [which were negative]. She had placement of a wound VAC. Parent really ACEL was not available to be placed. The wound VAC foam apparently adhered to the wound since then she's been using silver alginate, Xeroform under Ace wraps. She still says there is a lot of drainage and a lot of pain 01/31/16; this is a patient I see monthly. I had referred her to Dr. Marla Roe him of plastic surgery for large wounds on her bilateral medial ankles. She has been to the OR twice once in early July and once in early August. She tells me over the last 3 weeks she has been using the wound VAC with ACEL underneath it. On the right  we've simply been using silver alginate. Under Kerlix Coban wraps. 02/28/16; this is a patient I'm currently seeing monthly. She is gone on to have a skin graft over her large venous insufficiency ulcer on the left medial ankle. This was done by Dr. Marla Roe him. The patient is a bit perturbed about why she didn't have one on her right medial ankle wound. She has been using silver alginate to this. 03/06/16; I received a phone call from her plastic surgery Dr. Marla Roe. She expressed some concern about the viability of the skin graft she did on the left medial ankle wound. Asked me to place Endoform on this. She told me she is not planning to do a subsequent skin graft on the right as the left one did not take very well. I had placed Hydrofera Blue on the right 03/13/16; continue to have a reasonably healthy wound on the right medial ankle. Down to 3 mm in terms of size. There is epithelialization here. The area on the left medial ankle is her skin graft site. I suppose the last week this looks somewhat better. She has an open area inferiorly however in the center there appears to be some viable tissue. There is a lot of surface callus and eschar that will eventually need to come off however none of this looked to be infected. Patient states that the is able to keep the dressing on for several days which is an improvement. 03/20/16 no major change in the circumference of either wound however on the left side the patient was at Dr. Eusebio Friendly office and they did a debridement of left wound. 50% of the wound seems to be epithelialized. I been using Endoform on the left Hydrofera Blue in the right 03/27/16; she arrives today with her wound is not looking as healthy as they did last week. The area on the right clearly has an adherent surface to this a very similar surface on the left. Unfortunately for this patient this is all too familiar problem. Clearly the Endoform is not working and will need to  change that today that has some potential to help this surface. She does not tolerate debridement in this clinic very well. She is changing the dressing wants 04/03/16; patient arrives with the wounds looking somewhat better especially on the right. Dr. Migdalia Dk change the dressing to silver alginate when she saw her on Monday and also sold her some compression socks. The usefulness of the latter is really not clear and woman with severely draining wounds. 04/10/16; the patient is doing a bit of an experiment wearing the compression  stockings that Dr. Migdalia Dk provided her to her left leg and the out of legs based dressings that we provided to the right. 05/01/16; the patient is continuing to wear compression stockings Dr. Migdalia Dk provided her on the left that are apparently silver impregnated. She has been using Iodoflex to the right leg wound. Still a moderate amount of drainage, when she leaves here the wraps only last for 4 days. She has to change the stocking on the left leg every night 05/15/16; she is now using compression stockings bilaterally provided by Dr. Marla Roe. She is wearing a nonadherent layer over the wounds so really I don't think there is anything specific being done to this now. She has some reduction on the left wound. The right is stable. I think all healing here is being done without a specific dressing 06/09/16; patient arrives here today with not much change in the wound certainly in diameter to large circular wounds over the medial aspect of her ankle bilaterally. Under the light of these services are certainly not viable for healing. There is no evidence of surrounding infection. She is wearing compression stockings with some sort of silver impregnation as prescribed by Dr. Marla Roe. She has a follow-up with her tomorrow. 06/30/16; no major change in the size or condition of her wounds. These are still probably covered with a nonviable surface. She is using only her  purchase stockings. She did see Dr. Marla Roe who seemed to want to apply Dakin's solution to this I'm not extreme short what value this would be. I would suggest Iodoflex which she still has at home. 07/28/16; I follow Mrs. Gunderson episodically along with Dr. Marla Roe. 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 She has been applying a topical collagen ointment to both wounds with Adaptic. I don't think Dr. Marla Roe is planning to take her back to the OR. 08/19/16; I follow Mrs. [name] on a monthly basis along with Dr. Marla Roe of plastic surgery. 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 I been following her for a number of years. At one point I was able to get the right medial malleolus wound to heal and had the left medial malleolus down to about half its current size however and I had to send her to plastic surgery for an operative debridement. Since then things have been stable to slightly improve the area on the right is slightly better one in the left about the same although there is much less adherent surface than I'm used to with this patient. She is using some form of liquid collagen gel that Dr. Marla Roe provided a Kerlix cover with the patient's own pressure stockings. She tells me that she has extreme pain in both ankles and along the lateral aspect of both feet. She has been unable to work for some period of time. She is telling me she is retiring at the beginning of April. She sees Dr. Doran Durand of orthopedics next week 09/22/16; patient has not seen Dr. Marla Roe since the last time she is here. I'm not really sure what she is using to the wounds other than bits and pieces of think she had left over including most recently Hydrofera Blue. She is using juxtalite stockings. She is having difficulty with her husband's recent illness "stroke". She is having  to transport him to various doctors appointments. Dr. Marla Roe left her the option of a repeat debridement with ACEL  however she has not been able to get the time to follow-up on this. She continues to have a fair amount of drainage out of these wounds with certainly precludes leaving dressings on all week 10/13/16; patient has not seen Dr. Marla Roe since she was last in our clinic. I'm not really sure what she is doing with the wounds, we did try to get her [facility] and I think she is actually using this most of the time. Because of drainage she states she has to change this every second day although this is an improvement from what she used to do. She went to see Dr. Doran Durand who did not think she had a muscular issue with regards to her feet, he referred her to a neurologist and I think the appointment is sometime in June. I changed her back to Iodoflex which she has used in the past but not recently. 11/03/16; the patient has been using Iodoflex although she ran out of this. Still claims that there is a lot of drainage although the wound does not look like this. No surrounding erythema. She has not been back to see Dr. Marla Roe 11/24/16; the patient has been using Iodoflex again but she ran out of it 2 or 3 days ago. There is no major change in the condition of either one of these wounds in fact they are larger and covered in a thick adherent surface slough/nonviable tissue especially on the left. She does not tolerate mechanical debridement in our clinic. Going back to see Dr. Marla Roe of plastic surgery for an operative debridement would seem reasonable. 12/15/16; the patient has not been back to see Dr. Marla Roe. She is been dealing with a series of illnesses and her husband which of monopolized her time. She is been using Sorbact which we largely supplied. She states the drainage is bad enough that it maximum she can go 2-3 days without changing the dressing 01/12/2017 -- the patient  has not been back for about 4 weeks and has not seen Dr. Marla Roe not does she have any appointment pending. 01/23/17; patient has not seen Dr. Marla Roe even though I suggested this previously. She is using Santyl that was suggested last week by Dr. Con Memos this Cost her $16 through her insurance which is indeed surprising 02/12/17; continuing Santyl and the patient is changing this daily. A lot of drainage. She has not been back to see plastic surgery she is using an Ace wrap. Our intake nurse suggested wrap around stockings which would make a good reasonable alternative 02/26/17; patient is been using Santyl and changing this daily due to drainage. She has not been to see plastic surgery she uses in April Ace wrap to control the edema. She did obtain extremitease stockings but stated that the edema in her leg was to big for these 03/20/17; patient is using Santyl and Anasept. Surfaces looked better today the area on the right is actually measuring a little smaller. She has states she has a lot of pain in her feet and ankles and is asking for a consult to pain control which I'll try to help her with through our case manager. 04/10/17; the patient arrives with better-looking wound surfaces and is slightly smaller wound on the left she is using a combination of Santyl and Anasept. She has an appointment or at least as started in the pain control center associated with [facility] 05/14/17; this is a patient who I followed for a prolonged period of time. She has venous insufficiency  ulcers on her bilateral medial ankles. At one point I had this down to a much smaller wound on the left however these reopened and we've never been able to get these to heal. She has been using Santyl and Anasept gel although 2 weeks ago she ran out of the Anasept gel. She has a stable appearance of the wound. She is going to the wound care clinic at [facility]. They wanted do a nerve block/spinal block although  she tells me she is reluctant to go forward with that. 05/21/17; this is a patient I have followed for many years. She has venous insufficiency ulcers on her bilateral medial ankles. Chronic pain and deformity in her ankles as well. She is been to see plastic surgery as well as orthopedics. Using PolyMem AG most recently/Kerramax/ABDs and 2 layer compression. She has managed to keep this on and she is coming in for a nurse check to change the dressing on Tuesdays, we see her on Fridays 06/05/17; really quite a good looking surface and the area especially on the right medial has contracted in terms of dimensions. Well granulated healthy-looking tissue on both sides. Even with an open curet there is nothing that even feels abnormal here. This is as good as I've seen this in quite some time. We have been using PolyMem AG and bringing her in for a nurse check 06/12/17; really quite good surface on both of these wounds. The right medial has contracted a bit left is not. We've been using PolyMem and AG and she is coming in for a nurse visit 06/19/17; we have been using PolyMem AG and bringing her in for a nurse check. Dimensions of her wounds are not better but the surfaces looked better bilaterally. She complained of bleeding last night and the left wound and increasing pain bilaterally. She states her wound pain is more neuropathic than just the wounds. There was some suggestion that this was radicular from her pain management doctor in talking to her it is really difficult to sort this out. 06/26/17; using PolyMem and AG and bringing her in for a nurse check as All of this and reasonably stable condition. Certainly not improved. The dimensions on the lateral part of the right leg look better but not really measuring better. The medial aspect on the left is about the same. 07/03/16; we have been using PolyMen AG and bringing her in for a nurse check to change the dressings as the wounds have drainage which  precludes once weekly changing. We are using all secondary absorptive dressings.our intake nurse is brought up the idea of using a wound VAC/snap VAC on the wound to help with the drainage to see if this would result in some contraction. This is not a bad idea. The area on the right medial is actually looking smaller. Both wounds have a reasonable-looking surface. There is no evidence of cellulitis. The edema is well controlled 07/10/17; the patient was denied for a snap VAC by her insurance. The major issue with these wounds continues to be drainage. We are using wicked PolyMem AG and she is coming in for a nurse visit to change this. The wounds are stable to slightly improved. The surface looks vibrant and the area on the right certainly has shrunk in size but very slowly 07/17/17; the patient still has large wounds on her bilateral medial malleoli. Surface of both of these wounds looks better. The dimensions seem to come and go but no consistent improvement. There is no  epithelialization. We do not have options for advanced treatment products due to insurance issues. They did not approve of the wound VAC to help control the drainage. More recently we've been using PolyMem and AG wicked to allow drainage through. We have been bringing her in for a nurse visit to change this. We do not have a lot of options for wound care products and the home again due to insurance issues 07/24/17; the patient's wound actually looks somewhat better today. No drainage measurements are smaller still healthy-looking surface. We used silver collagen under PolyMen started last week. We have been bringing her in for a dressing change 07/31/17; patient's wound surface continued to look better and I think there is visible change in the dimensions of the wound on the right. Rims of epithelialization. We have been using silver collagen under PolyMen and bringing her in for a dressing change. There appears to be less drainage  although she is still in need of the dressing change 08/07/17. Patient's wound surface continues to look better on both sides and the area on the right is definitely smaller. We have been using silver collagen and PolyMen. She feels that the drainage has been it has been better. I asked her about her vascular status. She went to see Dr. Aleda Grana at [facility] and had some form of ablation. I don't have much detail on this. I haven't my notes from 2016 that she was not a candidate for any further ablation but I don't have any more information on this. We had referred her to vein and vascular I don't think she ever went. He does not have a history of PAD although I don't have any information on this either. We don't even have ABIs in our record 08/14/17; we've been using silver collagen and PolyMen cover. And putting the patient and compression. She we are bringing her in as a nurse visit to change this because ofarge amount of drainage. We didn't the ABIs in clinic today since they had been done in many moons 1.2 bilaterally. She has been to see vein and vascular however this was at [facility] and she had ablation although I really don't have any information on this all seemed biking get a report. She is also been operatively debrided by plastic surgery and had a cell placed probably 8-12 months ago. This didn't have a major effect. We've been making some gains with current dressings 08/19/17-She is here in follow-up evaluation for bilateral medial malleoli ulcers. She continues to tolerate debridement very poorly. We will continue with recently changed topical treatment; if no significant improvement may consider switching to Iodosorb/Iodoflex. 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 She has been using silver collagen and PolyMem. I believe she has been used and tried on Iodoflex before. During her trip to the clinic we've been watching her  wound with Anasept spray and I would like to encourage this on thenurse visit days 09/04/17 bilateral medial malleoli ulcers area is her chronic related to chronic venous insufficiency. These have been very refractory over time. We have been using silver collagen and PolyMen. She is coming in once a week for a doctor's and once a week for nurse visits. We are actually making some progress 09/18/17; the patient's wounds are smaller especially on the right medial. She arrives today to upset to consider even washing these off with Anasept which I think is been part of the reason this is been closing. We've been  using collagen covered in PolyMen otherwise. It is noted that she has a small area of folliculitis on the right medial calf that. As we are wrapping her legs I'll give her a short course of doxycycline to make sure this doesn't amount to anything. She is a long list of complaints today including imbalance, shortness of breath on exertion, inversion of her left ankle. With regards to the latter complaints she is been to see orthopedics and they offered her a tendon release surgery I believe but wanted her wounds to be closed first. I have recommended she go see her primary physician with regards to everything else. 09/25/17; patient's wounds are about the same size. We have made some progress bilaterally although not in recent weeks. She will not allow me T wash these wounds with Anasept even if she is doing her cell. Wheeze we've been using collagen covered in PolyMen. Last week she had a small area of folliculitis this is now opened into a small wound. She completed 5 days of trimethoprim sulfamethoxazole 10/02/17; unfortunately the area on her left medial ankle is worse with a larger wound area towards the Achilles. The patient complains of a lot of pain. She will not allow debridement although visually I don't think there is anything to debridement in any case. We have been using silver collagen and  PolyMen for several months now. Initially we are making some progress although I'm not really seeing that today. We will move back to [facility]. His admittedly this is a bit of a repeat however I'm hoping that his situation is different now. The patient tells me she had her leg on the left give out on her yesterday this is process some pain. 10/09/17; the patient is seen twice a week largely because of drainage issues coming out of the chronic medial bimalleolar wounds that are chronic. Last week the dimensions of the one on the left looks a little larger I changed her to [facility]. She comes in today with a history of terrible pain in the bilateral wound areas. She will not allow debridement. She will not even allow a tissue culture. There is no surrounding erythema no no evidence of cellulitis. We have been putting her Kerlix Coban man. She will not allow more aggressive compression as there was a suggestion to put her in 3 layer wraps. 10/16/17; large wounds on her bilateral medial malleoli. These are chronic. Not much change from last week. The surface looks have healthy but absolutely no epithelialization. A lot of pain little less so of drainage. She will not allow debridement or even washing these off in the vigorous fashion with Anasept. 10/23/17; large wounds on her bilateral malleoli which are chronic. Some improvement in terms of size perhaps on the right since last time I saw these. She states that after we increased the 3 layer compression there was some bleeding, when she came in for a nurse visit she did not want 3 layer compression put back on about our nurse managed to convince her. She has known chronic venous visit issues and I'm hoping to get her to tolerate the 3 layer compression. using Hydrofera Blue 10/30/17; absolutely no change in the condition of either wound although we've had some improvement in dimensions on the right.. Attempted to put her in 3 layer compression  she didn't tolerated she is back in 2 layer compression. We've been using Hydrofera Blue We looked over her past records. She had venous reflux studies in November 2016.  There was no evidence of deep venous reflux on the right. Superficial vein did not show the greater saphenous vein at think this is been previously ablated the small saphenous vein was within normal limits. The left deep venous system showed no DVT the vessels were positive for deep venous reflux in the posterior tibial veins at the ankle. The greater saphenous vein was surgically absent small saphenous vein was within normal limits. She went to vein and vascular at [facility]. I believe she had an ablation on the left greater saphenous vein. I'll update her reflux studies perhaps ever reviewed by vein and vascular. We've made absolutely no progress in these wounds. Will also try to read and TheraSkins through her insurance 11/06/17; W the patient apparently has a 2 week follow-up with vein and vascular I like him to review the whole issue with regards to her previous vascular workup by Dr. Aleda Grana. We've really made no progress on these wounds in many months. She arrives today with less viable looking surface on the left medial ankle wound. This was apparently looking about the same on Tuesday when she was here for nurse visit. 11/13/17; deep tissue culture I did last time of the left lower leg showed multiple organisms without any predominating. In particular no Staphylococcus or group A strep were isolated. We sent her for venous reflux studies. She's had a previous left greater saphenous vein stripping and I think sclerotherapy of the right greater saphenous vein. She didn't really look at the lesser saphenous vein this both wounds are on the medial aspect. She has reflux in the common femoral vein and popliteal vein and an accessory vein on the right and the common femoral vein and popliteal vein on the left. I'm going to  have her go to see vein and vascular just the look over things and see if anything else beside aggressive compression is indicated here. We have not been able to make any progress on these wounds in spite of the fact that the surface of the wounds is never look too bad. 11/20/17; no major change in the condition of the wounds. Patient reports a large amount of drainage. She has a lot of complaints of pain although enlisting her today I wonder if some of this at least his neuropathic rather than secondary to her wounds. She has an appointment with vein and vascular on 12/30/17. The refractory nature of these wounds in my mind at least need vein and vascular to look over the wounds the recent reflux studies we did and her history to see if anything further can be done here. I also note her gait is deteriorated quite a bit. 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 She has a bilateral Trendelenburg gait. I wonder if this is neuropathic or perhaps multilevel radicular. 11/27/17; her wounds actually looks slightly better. Healthy-looking granulation tissue a scant amount of epithelialization. [place] healthcare will not pay for [employer]. They will play for tri layer Oasis and Dermagraft. This is not a diabetic ulcer. We'll try for the tri layer Oasis. She still complains of some drainage. She has a vein and vascular appointment on 12/30/17 12/04/17; the wounds visually look quite good. Healthy-looking granulation with some degree of epithelialization. We are still waiting for response to our request for trial to try layer Oasis. Her appointment with vascular to review venous and arterial issues isn't sold the end of July 7/31. Not allow debridement or even vigorous cleansing of the wound surface.  12/18/17; slightly smaller especially on the right. 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 We've been using Hydrofera Blue. We still are looking into triple layer Oasis  through her insurance 01/08/18 on evaluation today patient's wound actually appears to be showing signs of good improvement at this point in time. She has been tolerating the dressing changes without complication. Fortunately there does not appear to be any evidence of infection at this point in time. We have been utilizing silver nitrate which does seem to be of benefit for her which is also good news. Overall I'm very happy with how things seem to be both regards appearance as well as measurement. Patient did see Dr. Bridgett Larsson for evaluation on 12/30/17. In his assessment he felt that stripping would not likely add much more than chronic compression to the patient's healing process. His recommendation was to follow-up in three months with Dr. Doren Custard if she hasn't healed in order to consider referral back to you and see vascular where she previously was in a trial and was able to get her wound to heal. I'll be see what she feels she when you staying compression and he reiterated this as well. 01/13/18 on evaluation today patient appears to actually be doing very well in regard to her bilateral medial malleolus ulcers. She seems to have tolerated the chemical cauterization with silver nitrate last week she did have some pain through that evening but fortunately states that I'll be see since it seems to be doing better she is overall pleased with the progress. 01/21/18; really quite a remarkable improvement since I've last seen these wounds. We started using silver nitrate specially on the islands of hyper granulation which for some reason her around the wound circumference. This is really done quite nicely. Primary dressing Hydrofera Blue under 4 layer compression. She seems to be able to hold out without a nurse rewrap. Follow-up in 1 week 01/28/18; we've continued the hydrofera blue but continued with chemical cauterization to the wound area that we started about a month ago for irregular hyper granulation.  She is made almost stunning improvement in the overall wound dimensions. I was not really expecting this degree of improvement in these chronic wounds 02/05/18; we continue with Hydrofera Bluebut of also continued the aggressive chemical cauterization with silver nitrate. We made nice progress with the right greater than left wound. 02/12/18. We continued with Hydrofera Blue after aggressive chemical cauterization with silver nitrate. We appear to be making nice progress with both wound areas 02/19/2018; we continue with [facility] after washing the wounds vigorously with Anasept spray and chemical cauterization with silver nitrate. We are making excellent progress. The area on the right's just about closed 02/26/2018. The area on the left medial ankle had too much necrotic debris today. I used a #5 curette we are able to get most of the soft. I continued with the silver nitrate to the much smaller wound on the right medial ankle she had a new area on her right lower pretibial area which she says was due to a role in her compression 03/05/2018; both wound areas look healthy. Not much change in dimensions from last week. I continue to use silver nitrate and Hydrofera Blue. The patient saw Dr. Doren Custard of vein and vascular. He felt she had venous stasis ulcers. He felt based on her previous arteriogram she should have adequate circulation for healing. Also she has deep venous reflux but really no significant correctable superficial venous reflux at this time. He  felt we should continue with conservative management including leg elevation and compression 04/02/2018; since we last saw this woman about a month ago she had a fall apparently suffered a pelvic fracture. I did not look up the x-ray. Nevertheless because of pain she literally was bedbound for 2 weeks and had home health coming out to change the dressing. Somewhat predictably this is resulted in considerable improvement in both wound areas. The  right is just about closed on the medial malleolus and the left is about half the size. 04/16/2018; both her wounds continue to go down in size. Using Hydrofera Blue. 05/07/18; both her wounds appeared to be improving especially on the right where it is almost closed. We are using Hydrofera Blue 05/14/2018; slightly worse this week with larger wounds. Surface on the left medial not quite as good. We have been using Hydrofera Blue 05/21/18; again the wounds are slightly larger. Left medial malleolus slightly larger with eschar around the circumference. We have been using Hydrofera Blue undergoing a wraps for a prolonged period of time. This got a lot better when she was more recumbent due to a fall and a back injury. I change the primary dressing the silver alginate today. She did not tolerate a 4 layer compression previously although I may need to bring this up with her next time 05/28/2018; area on the left medial malleolus again is slightly larger with more drainage. Area on the right is roughly unchanged. She has a small area of folliculitis on the right medial just on the lower calf. This does not look ominous. 06/03/2018 left medial malleolus slightly smaller in a better looking surface. We used silver nitrate on this last time with silver alginate. The area on the right appears slightly smaller 1/10; left medial malleolus slightly smaller. Small open area on the right. We used silver nitrate and silver alginate as of 2 weeks ago. We continue with the wound and compression. These got a lot better when she was off her feet 1/17; right medial malleolus wound is smaller. The left may be slightly smaller. Both surfaces look somewhat better. 1/24; both wounds are slightly smaller. Using silver alginate under Unna boots 1/31; both wounds appear smaller in fact the area on the right medial is just about closed. Surface eschar. We have been using silver alginate under Unna boots. The patient is less active  now spends let much less time on her feet and I think this is contributed to the general improvement in the wound condition 2/7; both wounds appear smaller. I was hopeful the right medial would be closed however there there is still the same small open area. Slight amount of surface eschar on the left the dimensions are smaller there is eschar but the wound edges appear to be free. We have been using silver alginate under Unna boot's 2/14; both wounds once again measure smaller. Circumferential eschar on the left medial. We have been using silver alginate under Unna boots with gradual improvement 2/21; the area on the right medial malleolus has healed. The area on the left is smaller. We have been using silver alginate and Unna boots. We can discharge wrapping the right leg she has 20/30 stockings at home she will need to protect the scar tissue in this area 2/28; the area on the right medial malleolus remains closed the patient has a compression stocking. The area on the left is smaller. We have been using silver alginate and Unna boots. 3/6 the area on the right medial  ankle remains closed. Good edema control noted she is using her own compression stocking. The area on the left medial ankle is smaller. We have been managing this with silver alginate and Unna boots which we will continue today. 3/13; the area on the right medial ankle remains closed and I'm declaring it healed today. When necessary the left is about the same still a healthy-looking surface but no major change and wound area. No evidence of infection and using silver alginate under unna and generally making considerable improvement 3/27 the area on the right medial ankle remains closed the area on the left is about the same as last week. Certainly not any worse we have been using silver alginate under an Unna boot 4/3; the area on the right medial ankle remains closed per the patient. We did not look at this wound. The wound on the  left medial ankle is about the same surface looks healthy we have been using silver alginate under an Unna boot 4/10; area on the right medial ankle remains closed per the patient. We did not look at this wound. The wound on the left medial ankle is slightly larger. The patient complains that the [facility] caused burning pain all week. She also told us that she was a lot more active this week. Changed her back to silver alginate 4/17; right medial ankle still closed per the patient. Left medial ankle is slightly larger. Using silver alginate. She did not tolerate Hydrofera Blue on this area 4/24; right medial ankle remains closed we have not look at this. The left medial ankle continues to get larger today by about a centimeter. We have been using silver alginate under Unna boots. She complains about 4 layer compression as an alternative. She has been up on her feet working on her garden 5/8; right medial ankle remains closed we did not look at this. The left medial ankle has increased in size about 100%. We have been using silver alginate under Unna boots. She noted increased pain this week and was not surprised that the wound is deteriorated 5/15; no major change in SA however much less erythema ( one week of doxy ocellulitis). 5/22-65 year old female returns at 1 week to the clinic for left medial ankle wound for which we have been using silver alginate under 3 layer compression She was placed on DOXY at last visit - the wound is wider at this visit. She is in 3 layer compression 5/29; change to [facility] last week. I had given her empiric doxycycline 2 weeks ago for a week. She is in 3 layer compression. She complains of a lot of pain and drainage on presentation today. 6/5; using Hydrofera Blue. I gave her doxycycline recently empirically for erythema and pain around the wound. Believe her cultures showed enterococcus which not would not have been well covered by doxycycline  nevertheless the wound looks better and I don't feel specifically that the enterococcus needs to be covered. She has a new what looks like a wrap injury on her lateral left ankle. 6/12; she is using Hydrofera Blue. She has a new area on the left anterior lower tibial area. This was a wrap injury last week. 6/19; the patient is using Hydrofera Blue. She arrived with marked inflammation and erythema around the wound and tenderness. 12/01/18 on evaluation today patient appears to be doing a little bit better based on what I'm hearing from the standpoint of lassos evaluation to this as far as the overall appearance of  the wound is concerned. Then sometime substandard she typically sees Dr. Dellia Nims. Nonetheless overall very pleased with the progress that she's made up to this point. No fevers, chills, nausea, or vomiting noted at this time. 7/10; some improvement in the surface area. Aggressively debrided last week apparently. I went ahead with the debridement today although the patient does not tolerate this very well. We have been using Iodoflex. Still a fair amount of drainage 7/17; slightly smaller. Using Iodoflex. 7/24; no change from last week in terms of surface area. We have been using Iodoflex. Surface looks and continues to look somewhat better 7/31; surface area slightly smaller better looking surface. We have been using Iodoflex. This is under Unna boot compression 8/7-Patient presents at 1 week with Unna boot and Iodoflex, wound appears better 8/14-Patient presents at 1 week with Iodoflex, we use the Unna boot, wound appears to be stable better.Patient is getting Botox treatment for the inversion of the foot for tendon release, Next week 8/21; we are using Iodoflex. Unna boot. The wound is stable in terms of surface area. Under illumination there is some areas of the wound that appear to be either epithelialized or perhaps this is adherent slough at this point I was not really clear. It did not  wipe off and I was reluctant to debride this today. 8/28; we are using Iodoflex in an Unna boot. Seems to be making good improvement. 9/4; using Iodoflex and wound is slightly smaller. 9/18; we are using Iodoflex with topical silver nitrate when she is here. The wound continues to be smaller 10/2; patient missed her appointment last week due to GI issues. She left and Iodoflex based dressing on for 2 weeks. Wound is about the same size about the size of a dime on the left medial lower 10/9 we have been using Iodoflex on the medial left ankle wound. She has a new superficial probable wrap injury on the dorsal left ankle 10/16; we have been using Hydrofera Blue since last week. This is on the left medial ankle 10/23; we have been using Hydrofera Blue since 2 weeks ago. This is on the left medial ankle. Dimensions are better 11/6; using Hydrofera Blue. I think the wound is smaller but still not closed. Left medial ankle 11/13; we have been using Hydrofera Blue. Wound is certainly no smaller this week. Also the surface not as good. This is the remanent of a very large area on her left medial ankle. 11/20; using Sorbact since last week. Wound was about the same in terms of size although I was disappointed about the surface debris 12/11; 3-week follow-up. Patient was on vacation. Wound is measuring slightly larger we have been using Sorbact. 12/18; wound is about the same size however surface looks better last week after debridement. We have been using Sorbact under compression 1/15 wound is probably twice the size of last time increased in length nonviable surface. We have been using Sorbact. She was running a mild fever and missed her appointment last week 1/22; the wound is come down in size but under illumination still a very adherent debris we have been Hydrofera Blue that I changed her to last week 1/29; dimensions down slightly. We have been using Hydrofera Blue 2/19 dimensions are the same  however there is rims of epithelialization under illumination. Therefore more the surface area may be epithelialized 2/26; the patient's wound actually measures smaller. The wound looks healthy. We have been using Hydrofera Blue. I had some thoughts about running Apligraf  then I still may do that however this looks so much better this week we will delay that for now 3/5; the wound is small but about the same as last week. We have been using Hydrofera Blue. No debridement is required today. 3/19; the wound is about the size of a dime. Healthy looking wound even under illumination. We have been using Hydrofera Blue. No mechanical debridement is necessary 3/26; not much change from last week although still looks very healthy. We have been using Hydrofera Blue under Unna boots Patient was offered an ankle fusion by podiatry but not until the wound heals with a proceed with this. 4/9; the patient comes in today with her original wound on the medial ankle looking satisfactory however she has some uncontrolled swelling in the middle part of her leg with 2 new open areas superiorly just lateral to the tibia. I think this was probably a wrap issue. She said she felt uncomfortable during the week but did not call in. We have been using Hydrofera Blue 4/16; the wound on the medial ankle is about the same. She has innumerable small areas superior to this across her mid tibia. I think this is probably folliculitis. She is also been working in the yard doing a lot of sweating 4/30; the patient issue on the upper areas across her mid tibia of all healed. I think this was excessive yard work if I remember. Her wound on the medial ankle is smaller. Some debris on this we have been using Hydrofera Blue under Unna boots 5/7; mid tibia. She has been using Hydrofera Blue under an Unna wrap. She is apparently going for her ankle surgery on June 3 10/28/19-Patient returns to clinic with the ankle wound, we are using  Hydrofera Blue under Unna wrap, surgery is scheduled for her left foot for June 3 so she will be back for nurse visit next week READMISSION 01/17/2020 Mrs. Hilker is a 65 year old woman we have had in this clinic for a long period of time with severe venous hypertension and refractory wounds on her medial lower legs and ankles bilaterally. 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 When she was off her legs for a prolonged period example when she fell and suffered a compression fracture things would heal up quite nicely. She is now retired and we managed to heal up the right medial leg wound. The left one was very tiny last time I saw this although still refractory. 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 It got to the point where this was interfering with her walking and she elected to proceed with a ankle arthrodesis to straighten her her ankle and leave her with a functional outcome for mobilization. The patient was referred to Dr. Doren Custard and really this took some time to arrange. Dr. Doren Custard saw her on 12/07/2019. Once again he verified that she had no arterial issues. She had previously had an angiogram several years ago. Follow-up ABIs on the left showed an ABI of 1.12 with triphasic waveforms and a TBI of 0.92. She is felt to have chronic deep venous insufficiency but I do not think it was felt that anything could be done from about this from an ablation point of view. At the time Dr. Doren Custard saw this patient the  wounds actually look closed via the pictures in his clinic. The patient finally underwent her surgery on 12/15/2019. This went reasonably well and there was a good anatomic outcome. She developed a small distal wound dehiscence on the lateral part of the surgical wound. However  more problematically she is developed recurrence of the wound on the medial left ankle. There are actually 2 wounds here one in the distal lower leg and 1 pretty much at the level of the medial malleolus. It is a more distal area that is more problematic. She has been using Hydrofera Blue which started on Friday before this she was simply Ace wrapping. There was a culture done that showed Pseudomonas and she is on ciprofloxacin. A recent CNS on 8/11 was negative. The patient reports some pain but I generally think this is improving. She is using a cam boot completely nonweightbearing using a walker for pivot transfers and a wheelchair 8/24; not much improvement unfortunately she has a surgical wound on the lateral part in the venous insufficiency wound medially. The bottom part of the medial insufficiency wound is still necrotic there is exposed tendon here. We have been using Hydrofera Blue under compression. Her edema control is however better 8/31; patient in for follow-up of his surgical wound on the lateral part of her left leg and chronic venous insufficiency ulcers medially. We put her back in compression last week. She comes in today with a complaint of 3 or 4 days worth of increasing pain. She felt her cam walker was rubbing on the area on the back of her heel. However there is intense erythema seems more likely she has cellulitis. She had 2 cultures done when she was seeing podiatry in the postop. One of them in late July showed Pseudomonas and she received a course of ciprofloxacin the other was negative on 8/11 she is allergic to penicillin with anaphylactoid complaints of hives oral swelling via information in epic 9/9; when I saw this patient last week she had intense anterior erythema around her wound on the right lateral heel and ankle and also into the right medial heel. Some of this was no doubt drainage and her walker boot however I was convinced she had cellulitis. I gave her  Levaquin and Bactrim she is finishing up on this now. She is following up with Dr. [name] he saw her yesterday. He is taken her out of the walking boot of course she is still nonweightbearing. Her x-ray was negative for any worrisome features such as soft tissue air etc. Things are a lot better this week. She has home health. We have been using Hydrofera Blue under an [employer] which she put back on yesterday. I did not wrap her last week 9/17; her surrounding skin looks a lot better. In fact the area on the left lateral ankle has just a scant amount of eschar. The only remaining wound is the large area on the left medial ankle. Probably about 60% of this is healthy granulation at the surface however she has a significant divot distally. This has adherent debris in it. I been using debridement and silver collagen to try and get this area to fill-in although I do not think we have made much progress this week 9/24; the patient's wound on the left medial ankle looks a lot better. The deeper divot area distally still requires debridement but this is cleaning up quite nicely we have been using silver collagen. The patient is complaining of swelling in her foot  and is worried that that is contributing to the nonhealing of the ankle wound. She is also complaining of numbness in her anterior toes 10/4; left medial ankle. The small area distally still has a divot with necrotic material that I have been debriding away. This has an undermining area. She is approved for Apligraf. She saw Dr. [name] her surgeon on 10/1. I think he declared himself is satisfied with the condition of things. Still nonweightbearing till the end of the month. 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 Her surgical wound is well closed. There is no evidence of infection 10/11; the patient arrived in clinic today with the expectation that we be able to put an Apligraf on this area after debridement however  she arrives with a relatively large amount of green drainage on the dressing. The patient states that this started on Friday. She has not been systemically unwell. 10/19; culture I did last week showed both Enterococcus and Pseudomonas. I think this came in separate parts because I stopped her ciprofloxacin I gave her and prescribed her linezolid however now looking at the final culture result this is Pseudomonas which is resistant to quinolones. She has not yet picked up the linezolid apparently phone issues. We are also trying to get a topical antibiotic out of [place] in [place] they can be applied by home health. She is still having green drainage 10/16; the patient has her topical antibiotic from [facility] in [place]. This is a compounded gel with vancomycin and ciprofloxacin and gentamicin. We are applying this on the wound bed with silver alginate over the top with Unna boot wraps. She arrives in clinic today with a lot less ominous looking drainage although she is only use this topical preparation once the second time today. She sees Dr. [name] her surgeon on Friday she has home health changing the dressing 11/2; still using her compounded topical antibiotic under silver alginate. Surface is cleaning up there is less drainage. We had an Apligraf for her today and I elected to apply it. A light coating of her antibiotic 04/25/2020 upon evaluation today patient appears to be doing well with regard to her ankle ulcer. There is a little bit of slough noted on the surface of the wound I am can have to perform sharp debridement to clear this away today. With that being said other than that fact overall I feel like she is making progress and we do see some new epithelial growth. There is also some improvement in the depth of the wound and that distal portion. There is little bit of slough there as well. 12/7; 2-week follow-up. Apligraf #3. Dimensions are smaller. Closing in  especially inferiorly. Still some surface debris. Still using the [facility] topical antibiotic but I told her that I don't think this needs to be renewed 12/21; 2-week follow-up. Apligraf #4 dimensions are smaller. Nice improvement 06/05/2020; 2-week follow-up. The patient's wound on the left medial ankle looks really excellent. Nice granulation. Advancing epithelialization no undermining no evidence of infection. We would have to reapply for another Apligraf but with the condition of this wound I did not feel strongly about it. We used Hydrofera Blue under the same degree of compression. She follows up with Dr. [name] her surgeon a week Friday 06/13/2020 upon evaluation today patient appears to be doing excellent in regard to her wound. She has been tolerating the dressing changes without complication. Fortunately there is no signs of active infection at this  time. No fevers, chills, nausea, vomiting, or diarrhea. She was using Hydrofera Blue last week. 06/20/2020 06/20/2020 on evaluation today patient actually appears to be doing excellent in regard to her wound. This is measuring better and looking much better as well. She has been using the collagen that seems to be doing better for her as well even though the [facility] was and is not sticking or feeling as rough on her wound. She did see Dr. [name] on Friday he is very pleased he also stated none of the hardware has shifted. That is great news 1/27; the patient has a small clean wound all that is remaining. I agree that this is too small to really consider an Apligraf. Under illumination the surface is looking quite good. We have been using collagen although the dimensions are not any better this week 2/2; the patient has a small clean wound on the left medial ankle. Although this left of her substantial original areas. Measurements are smaller. We have been using polymen Ag under an Haematologist. 2/10; small area on the left medial ankle. This looks  clean nothing to debride however dimensions are about the same we have been using polymen I think now for 2 weeks 2/17; not much change in surface area. We have been using polymen Ag without any improvement. 3/17; 1 month follow-up. The patient has been using endoform without any improvement in fact I think this looks worse with more depth and more expansion 3/24; no improvement. Perhaps less debris on the surface. We have been using Sorbact for 1 week 4/4; wound measures larger. She has edema in her leg and her foot which she tells as her wrap came down. We have been using Unna boots. Sorbact of the wound. She has been approved for Apligraf 09/12/2020 upon evaluation today patient appears to be doing well with regard to her wound. We did get the Apligraf reapproved which is great news we have that available for application today. Fortunately there is no signs of infection and overall the patient seems to be doing great. The wound bed is nice and clean. 4/27; patient presents for her second application of Apligraf. She states over the past week she has been on her feet more often due to being outside in her garden. She has noted more swelling to her foot as a result. She denies increased warmth, pain or erythema to the wound site. 10/10/2020 upon evaluation today patient appears to be doing well with regard to her wound which does not appear to be quite as irritated as last week from what I am hearing. With that being said unfortunately she is having issues with some erythema and warmth to touch as well as an increase size. I do believe this likely is infected. 10/17/2020 upon evaluation today patient appears to be doing excellent in regard to her wound this is significantly improved as compared to last week. Fortunately I think that the infection is much better controlled at this point. She did have evidence of both Enterococcus as well as Staphylococcus noted on culture. Enterococcus really would not  be helped significantly by the Cipro but the wound is doing so much better I am under the assumption that the Staphylococcus is probably the main organism that is causing the current infection. Nonetheless I think that she is doing excellent as far as that is concerned and I am very pleased in that regard. I would therefore recommend she continue with the Cipro. 10/31/2020 upon evaluation today patient  appears to be doing well with regard to her wound. She has been tolerating the dressing changes without complication. Fortunately there is no signs of active infection and overall I am extremely pleased with where things stand today. No fevers, chills, nausea, vomiting, or diarrhea. With that being said she does have some green drainage coming from the wound and although it looks okay I am a little concerned about the possibility of a continuing infection. Specifically with Pseudomonas. For that reason I will go ahead and send in a prescription for Cipro for her to be continued. 11/14/2020 upon evaluation today patient appears to be doing very well currently in regard to her wound on her leg. She has been tolerating the dressing changes without complication. Fortunately I feel like the infection is finally under good control here. Unfortunately we do not have the Apligraf for application today although we can definitely order to have it in place for next week. That will be her fifth and final of the current series. Nonetheless I feel like her wound is really doing quite well which is great news. 11/21/2020 upon evaluation today patient appears to be doing well with regard to her wound on the medial ankle. Fortunately I think the infection is under control and I do believe we can go ahead and reapply the Apligraf today. She is in agreement with that plan. There does not appear to be any signs of active infection at this time which is great news. No fevers, chills, nausea, vomiting, or diarrhea. 12/05/2020 upon  evaluation today patient's wound bed actually showed signs of good granulation epithelization at this point. There does not appear to be any signs of infection which is great news and overall very pleased with where things stand. Overall the patient seems to be doing fairly well in my opinion with regard to her wound although I do believe she continues to build up a lot of biofilm I think she could benefit from using PuraPly at this point. 12/12/2020 upon evaluation today patient's wound actually appears to be doing decently well today. The Unna boot has not been quite as well-tolerated so that more uncomfortable for her and even causing some pressure over the plate on the lateral portion of her foot which is 90 where the wound is. There did not appear to be any significant deep tissue injury with that there may be a minimal change in the skin noted I think that we may want to go back to the Coflex 2 layer which is a little bit easier on her skin it seems. 12/19/2020 upon evaluation today patient actually seems to be making great progress with the PuraPly currently. She in fact seems to be much better as far as the overall appearance of the wound bed is concerned I am very happy in this regard. I do not see any signs of of infection which is great news as well. No fevers, chills, nausea, vomiting, or diarrhea. 12/26/2020 upon evaluation today patient appears to actually be doing better in regard to her wound on the left medial ankle region. The surface of the wound is actually doing significantly better which is great news. There does not appear to be any signs of infection which is also great news and in general I am extremely pleased with where we stand today. 01/02/2021 upon evaluation today patient appears to be doing well with regard to her wound. In fact this is showing signs of excellent improvement and very pleased with where things stand. In  fact the last 3 appointments have all shown signs of this  getting smaller which is excellent news. I have not even had to perform any debridement and today is no exception. Overall I feel like this is dramatically improved compared to previous. T oday is PuraPly application #4. [id number] upon evaluation today patient appears to be doing excellent in regard to her wound this is continue to show signs of improvement and overall I am extremely pleased with where we stand today. She is actually here for PuraPly application #5. Every time we have applied this she is noted definite improvement on measurements. 01/23/2021 upon evaluation today patient is actually making good progress in regard to her wound. This was actually on just a little bit longer this time compared to previous due to the fact that she did have to go out of town. She is actually here for PuraPly application #6. We have definitely been seeing improvements in the overall quality of the tissue on the surface of the wound which is awesome news. In general I think that the patient seems to be continuing to make great progress here. 01/30/2021 upon evaluation today patient's wound is actually doing excellent. There is really not any significant biofilm buildup which is great news and overall I am extremely pleased with where things stand today. There does not appear to be any signs of active infection. No fevers, chills, nausea, vomiting, or diarrhea. Objective Constitutional Well-nourished and well-hydrated in no acute distress. Vitals Time Taken: 10:05 AM, Height: 68 in, Temperature: 97.9 F, Pulse: 60 bpm, Respiratory Rate: 18 breaths/min, Blood Pressure: 125/68 mmHg. Respiratory normal breathing without difficulty. Psychiatric this patient is able to make decisions and demonstrates good insight into disease process. Alert and Oriented x 3. pleasant and cooperative. General Notes: Upon inspection patient's wound bed showed signs of good granulation epithelization at this point. Fortunately  there does not appear to be any signs of infection in fact she had a minimal amount of slough removed with mechanical debridement using saline and gauze. Overall I do not think any sharp debridements even necessary. I do think that she is a prime candidate to continue with the PuraPly since she is making progress here I think we should continue as such. I did actually go ahead and reapply the PuraPly today secured with Adaptic no Steri-Strips I did put some triamcinolone around the edges of the wound as well to help with itching as she has been having quite a bit of itching. Integumentary (Hair, Skin) Wound #15 status is Open. Original cause of wound was Gradually Appeared. The date acquired was: 12/30/2019. The wound has been in treatment 54 weeks. The wound is located on the Left,Medial Malleolus. The wound measures 3.2cm length x 1.5cm width x 0.1cm depth; 3.77cm^2 area and 0.377cm^3 volume. There is Fat Layer (Subcutaneous Tissue) exposed. There is no tunneling or undermining noted. There is a medium amount of serosanguineous drainage noted. The wound margin is distinct with the outline attached to the wound base. There is large (67-100%) red granulation within the wound bed. There is a small (1-33%) amount of necrotic tissue within the wound bed including Adherent Slough. 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 A  skin graft procedure using a bioengineered skin substitute/cellular or tissue based product was performed by [name], PA with the following instrument(s): Forceps and Scissors. Puraply was applied and secured with  Adaptic. 12 sq cm of product was utilized and 0 sq cm was wasted. Post Application, Gauze, CoFlex was applied. A Time Out was conducted at 11:01, prior to the start of the procedure. The procedure was tolerated well. Post procedure Diagnosis Wound #15: Same as Pre-Procedure . Pre-procedure diagnosis of Wound #15 is a Venous Leg Ulcer located on the Left,Medial Malleolus . There was a Double Layer Compression Therapy Procedure by [name], RN. Post procedure Diagnosis Wound #15: Same as Pre-Procedure Plan Follow-up Appointments: Return Appointment in 1 week. - with Margarita Grizzle Cellular or Tissue Based Products: Cellular or Tissue Based Product Type: - PuraPly: #6, 01/30/21 PuraPly #7 Cellular or Tissue Based Product applied to wound bed, secured with steri-strips, cover with Adaptic or Mepitel. (DO NOT REMOVE). - no steristrips Bathing/ Shower/ Hygiene: May shower with protection but do not get wound dressing(s) wet. Edema Control - Lymphedema / SCD / Other: Elevate legs to the level of the heart or above for 30 minutes daily and/or when sitting, a frequency of: - throughout the day Avoid standing for long periods of time. Exercise regularly Compression stocking or Garment 20-30 mm/Hg pressure to: - right leg daily Additional Orders / Instructions: Follow Nutritious Diet WOUND #15: - Malleolus Wound Laterality: Left, Medial Cleanser: Soap and Water 1 x Per Week/30 Days Discharge Instructions: May shower and wash wound with dial antibacterial soap and water prior to dressing change. Peri-Wound Care: Triamcinolone 15 (g) 1 x Per Week/30 Days Discharge Instructions: Use triamcinolone 15 (g) as directed Peri-Wound Care: Sween Lotion (Moisturizing lotion) 1 x Per Week/30 Days Discharge Instructions: Apply moisturizing lotion as directed Prim Dressing: PuraPly AM 1 x Per Week/30 Days ary Secondary Dressing: Woven Gauze Sponge, Non-Sterile 4x4 in 1 x Per Week/30 Days Discharge Instructions:  Fold to bolster skin sub Secondary Dressing: ABD Pad, 5x9 1 x Per Week/30 Days Discharge Instructions: Apply over primary dressing and lateral ankle Secondary Dressing: Optifoam Non-Adhesive Dressing, 4x4 in 1 x Per Week/30 Days Discharge Instructions: Apply over primary dressing pad lateral foot with foam donut Secondary Dressing: ADAPTIC TOUCH 3x4.25 in 1 x Per Week/30 Days Discharge Instructions: Apply over skin sub Com pression Wrap: CoFlex TLC XL 2-layer Compression System 4x7 (in/yd) 1 x Per Week/30 Days Discharge Instructions: Apply CoFlex 2-layer compression as directed. (alt for 4 layer) 1. Would recommend currently that we continue with the PuraPly as long as we are seeing improvement like we did this week I think that we should continue as it is definitely helping to get this area heal. 2. I am also can recommend that we have the patient go ahead and continue with the compression wrap as well which I do believe is doing a great job as far as keeping her edema under good control. We will see patient back for reevaluation in 1 week here in the clinic. If anything worsens or changes patient will contact our office for additional recommendations. Electronic Signature(s) Signed: 01/30/2021 11:07:02 AM By: [name] PA-C Entered By: [name] on 01/30/2021 11:07:02 -------------------------------------------------------------------------------- SuperBill Details Patient Name: Date of Service: [facility] [name], [name] 01/30/2021 Medical Record Number: [id number] Patient Account Number: [id number] Date of Birth/Sex: Treating RN: [birth year] (65 y.o. [name] Provider: [name] Other Clinician: Referring Provider: Treating Provider/Extender: Joaquim Lai  III, Wyonia Hough, Barth Kirks Weeks in Treatment: 54 Diagnosis Coding ICD-10 Codes Code Description [phone] Chronic venous hypertension (idiopathic) with ulcer and inflammation of left lower extremity L97.828  Non-pressure chronic ulcer of other part of left lower leg with other specified severity L97.328 Non-pressure chronic ulcer of left ankle with other specified severity Facility Procedures CPT4 Code: [id number] Description: [id number] - SKIN SUB GRAFT TRNK/ARM/LEG ICD-10 Diagnosis Description L97.828 Non-pressure chronic ulcer of other part of left lower leg with other specified se Modifier: verity Quantity: 1 CPT4 Code: [id number] Description: [id number] PuraPly AM 3X4 (12sq. cm) enter 12qty ICD-10 Diagnosis Description L97.828 Non-pressure chronic ulcer of other part of left lower leg with other specified se Modifier: verity Quantity: 12 Physician Procedures : CPT4 Code Description Modifier [id number] - WC PHYS SKIN SUB GRAFT TRNK/ARM/LEG ICD-10 Diagnosis Description L97.828 Non-pressure chronic ulcer of other part of left lower leg with other specified severity Quantity: 1 Electronic Signature(s) Signed: 02/01/2021 11:24:38 AM By: [name] Signed: 02/01/2021 2:25:54 PM By: [name] PA-C Previous Signature: 01/30/2021 5:21:16 PM Version By: [name] PA-C Previous Signature: 01/30/2021 5:28:58 PM Version By: [name] Previous Signature: 01/30/2021 11:07:16 AM Version By: [name] PA-C Entered By: [name] on 02/01/2021 11:24:38

## 2021-02-06 ENCOUNTER — Other Ambulatory Visit: Payer: Self-pay

## 2021-02-06 ENCOUNTER — Encounter (HOSPITAL_BASED_OUTPATIENT_CLINIC_OR_DEPARTMENT_OTHER): Payer: Medicare Other | Attending: Physician Assistant | Admitting: Physician Assistant

## 2021-02-06 DIAGNOSIS — S81801A Unspecified open wound, right lower leg, initial encounter: Secondary | ICD-10-CM | POA: Insufficient documentation

## 2021-02-06 DIAGNOSIS — Z880 Allergy status to penicillin: Secondary | ICD-10-CM | POA: Diagnosis not present

## 2021-02-06 DIAGNOSIS — I87332 Chronic venous hypertension (idiopathic) with ulcer and inflammation of left lower extremity: Secondary | ICD-10-CM | POA: Diagnosis not present

## 2021-02-06 DIAGNOSIS — L97828 Non-pressure chronic ulcer of other part of left lower leg with other specified severity: Secondary | ICD-10-CM | POA: Diagnosis not present

## 2021-02-06 DIAGNOSIS — I872 Venous insufficiency (chronic) (peripheral): Secondary | ICD-10-CM | POA: Diagnosis not present

## 2021-02-06 DIAGNOSIS — L97328 Non-pressure chronic ulcer of left ankle with other specified severity: Secondary | ICD-10-CM | POA: Insufficient documentation

## 2021-02-06 NOTE — Progress Notes (Addendum)
MABREE, BUMAN ([id number]) Visit Report for 02/06/2021 Chief Complaint Document Details Patient Name: Date of Service: [name], [name] 02/06/2021 10:00 A M Medical Record Number: [id number] Patient Account Number: [id number] Date of Birth/Sex: Treating RN: 26-Feb-1956 (65 y.o. [name] Primary Care Provider: [name] Other Clinician: Referring Provider: Treating Provider/Extender: [name] in Treatment: 30 Information Obtained from: Patient Chief Complaint patient is been followed long-term in this clinic for venous insufficiency ulcers with inflammation, hypertension and ulceration over the medial ankle bilaterally. 01/17/2020; this is a patient who is here for review of postoperative wounds on the left lateral ankle and recurrence of venous stasis ulceration on the left medial Electronic Signature(s) Signed: 02/06/2021 10:24:15 AM By: [name] PA-C Entered By: [name] on 02/06/2021 10:24:15 -------------------------------------------------------------------------------- Cellular or Tissue Based Product Details Patient Name: Date of Service: [facility] [name], [name]. 02/06/2021 10:00 A M Medical Record Number: [id number] Patient Account Number: [id number] Date of Birth/Sex: Treating RN: 04/26/1956 (65 y.o. [name] Primary Care Provider: [name] Other Clinician: Referring Provider: Treating Provider/Extender: [name] in Treatment: 31 Cellular or Tissue Based Product Type Wound #15 Left,Medial Malleolus Applied to: Performed By: Physician [name], PA Cellular or Tissue Based Product Type: Puraply AM Level of Consciousness (Pre-procedure): Awake and Alert Pre-procedure Verification/Time Out Yes - 10:45 Taken: Location: trunk / arms / legs Wound Size (sq cm): 4.32 Product Size (sq cm): 12 Waste Size (sq cm): 0 Amount of Product Applied (sq cm): 12 Instrument Used: Forceps,  Scissors Lot #: [id number] Order #: [id number] Expiration Date: 04/23/2023 Fenestrated: No Reconstituted: Yes Solution Type: saline Solution Amount: 2 ml Lot #: [id number] Solution Expiration Date: 01/31/2022 Secured: No Dressing Applied: Yes Primary Dressing: adaptic, gauze Procedural Pain: 0 Post Procedural Pain: 0 Response to Treatment: Procedure was tolerated well Level of Consciousness (Post- Awake and Alert procedure): Post Procedure Diagnosis Same as Pre-procedure Electronic Signature(s) Signed: 02/06/2021 6:00:14 PM By: [name] PA-C Signed: 02/06/2021 6:09:03 PM By: Baruch Gouty RN, BSN Entered By: Baruch Gouty on 02/06/2021 10:52:06 -------------------------------------------------------------------------------- Debridement Details Patient Name: Date of Service: [name], [name]. 02/06/2021 10:00 A M Medical Record Number: [id number] Patient Account Number: [id number] Date of Birth/Sex: Treating RN: 07-24-1955 (65 y.o. [name] Primary Care Provider: [name] Other Clinician: Referring Provider: Treating Provider/Extender: [name] in Treatment: 31 Debridement Performed for Assessment: Wound #15 Left,Medial Malleolus Performed By: Physician [name], PA Debridement Type: Debridement Severity of Tissue Pre Debridement: Fat layer exposed Level of Consciousness (Pre-procedure): Awake and Alert Pre-procedure Verification/Time Out Yes - 10:40 Taken: Start Time: 10:40 Pain Control: Lidocaine 5% topical ointment T Area Debrided (L x W): otal 2.7 (cm) x 1.6 (cm) = 4.32 (cm) Tissue and other material debrided: Viable, Non-Viable, Slough, Subcutaneous, Slough Level: Skin/Subcutaneous Tissue Debridement Description: Excisional Instrument: Curette Bleeding: Minimum Hemostasis Achieved: Pressure End Time: 10:48 Procedural Pain: 4 Post Procedural Pain: 2 Response to Treatment: Procedure was tolerated well Level of  Consciousness (Post- Awake and Alert procedure): Post Debridement Measurements of Total Wound Length: (cm) 2.7 Width: (cm) 1.6 Depth: (cm) 0.1 Volume: (cm) 0.339 Character of Wound/Ulcer Post Debridement: Improved Severity of Tissue Post Debridement: Fat layer exposed Post Procedure Diagnosis Same as Pre-procedure Electronic Signature(s) Signed: 02/06/2021 6:00:14 PM By: [name] PA-C Signed: 02/06/2021 6:09:03 PM By: Baruch Gouty RN, BSN Entered By: Baruch Gouty on  02/06/2021 10:49:52 -------------------------------------------------------------------------------- HPI Details Patient Name: Date of Service: [facility] [name], [name] 02/06/2021 10:00 A M Medical Record Number: [id number] Patient Account Number: [id number] Date of Birth/Sex: Treating RN: [birth year] (65 y.o. [name] Primary Care Provider: [name] Other Clinician: Referring Provider: Treating Provider/Extender: [name] in Treatment: 28 History of Present Illness HPI Description: the remaining wound is over the left medial ankle. Similar wound over the right medial ankle healed largely with use of Apligraf. Most recently we have been using Hydrofera Blue over this wound with considerable improvement. The patient has been extensively worked up in the past for her venous insufficiency and she is not a candidate for antireflux surgery although I have none of the details available currently. 08/24/14; considerable improvement today. About 50% of this wound areas now epithelialized. The base of the wound appears to be healthier granulation.as opposed to last week when she had deteriorated a considerable improvement 08/17/14; unfortunately the wound has regressed somewhat. The areas of epithelialization from the superior aspect are not nearly as healthy as they were last week. The patient thinks her Hydrofera Blue slipped. 09/07/14; unfortunately the area has markedly regressed in  the 2 weeks since I've seen this. There is an odor surrounding erythema. The healthy granulation tissue that we had at the base of the wound now is a dusky color. The nurse reports green drainage 09/14/14; the area looks somewhat better than last week. There is less erythema and less drainage. The culture I did did not show any growth. Nevertheless I think it is better to continue the Cipro and doxycycline for a further week. The remaining wound area was debridement. 09/21/14. Wound did not require debridement last week. Still less erythema and less drainage. She can complete her antibiotics. The areas of epithelialization in the superior aspect of the wound do not look as healthy as they did some weeks ago 10/05/14 continued improvement in the condition of this wound. There is advancing epithelialization. Less aggressive debridement required 10/19/14 continued improvement in the condition and volume of this wound. Less aggressive debridement to the inferior part of this to remove surface slough and fibrinous eschar 11/02/14 no debridement is required. The surface granulation appears healthy although some of her islands of epithelialization seem to have regressed. No evidence of infection 11/16/14; lites surface debridement done of surface eschar. The wound does not look to be unhealthy. No evidence of infection. Unfortunately the patient has had podiatry issues in the right foot and for some reason has redeveloped small surface ulcerations in the medial right ankle. Her original presentation involved wounds in this area 11/23/14 no debridement. The area on the right ankle has enlarged. The left ankle wound appears stable in terms of the surface although there is periwound inflammation. There has been regression in the amount of new skin 11/30/14 no debridement. Both wound areas appear healthy. There was no evidence of infection. The the new area on the right medial ankle has enlarged although that both the  surfaces appear to be stable. 12/07/14; Debridement of the right medial ankle wound. No no debridement was done on the left. 12/14/14 no major change in and now bilateral medial ankle wounds. Both of these are very painful but the no overt evidence of infection. She has had previous venous ablation 12/21/14; patient states that her right medial ankle wound is considerably more painful last week than usual. Her left is also somewhat painful. She could not tolerate debridement. The  right medial ankle wound has fibrinous surface eschar 12/28/14 this is a patient with severe bilateral venous insufficiency ulcers. For a considerable period of time we actually had the one on the right medial ankle healed however this recently opened up again in June. The left medial ankle wound has been a refractory area with some absent flows. We had some success with Hydrofera Blue on this area and it literally closed by 50% however it is recently opened up Foley. Both of these were debridement today of surface eschar. She tolerates this poorly 01/25/15: No change in the status of this. Thick adherent escar. Very poor tolerance of any attempt at debridement. I had healed the right medial malleolus wound for a considerable amount of time and had the left one down to about 50% of the volume although this is totally regressed over the last 48 weeks. Further the right leg has reopened. she is trying to make a appointment with pain and vascular, previous ablations with Dr. Aleda Grana. I do not believe there is an arterial insufficiency issue here 02/01/15 the status of the adherent eschar bilaterally is actually improved. No debridement was done. She did not manage to get vascular studies done 02/08/15 continued debridement of the area was done today. The slough is less adherent and comes off with less pressure. There is no surrounding infection peripheral pulses are intact 02/15/15 selective debridement with a disposable curette.  Again the slough is less adherent and comes off with less difficulty. No surrounding infection peripheral pulses are intact. 02/22/15 selective debridement of the right medial ankle wound. Slough comes off with less difficulty. No obvious surrounding infection peripheral pulses are intact I did not debridement the one on the left. Both of these are stable to improved 03/01/15 selective debridement of both wound areas using a curette to. Adherent slough cup soft with less difficulty. No obvious surrounding infection. The patient tells me that 2 days ago she noted a rash above the right leg wrap. She did not have this on her lower legs when she change this over she arrives with widespread left greater than right almost folliculitis-looking rash which is extremely pruritic. I don't see anything to culture here. There is no rash on the rest of her body. She feels well systemically. 03/08/15; selective debridement of both wounds using a curette. Base of this does not look unhealthy. She had limegreen drainage coming out of the left leg wound and describes a lot of drainage. The rash on her left leg looks improved to. No cultures were done. 03/22/15; patient was not here last week. Basal wounds does not look healthy and there is no surrounding erythema. No drainage. There is still a rash on the left leg that almost looks vasculitic however it is clearly limited to the top of where the wrap would be. 04/05/15; on the right required a surgical debridement of surface eschar and necrotic subcutaneous tissue. I did not debridement the area on the left. These continue to be large open wounds that are not changing that much. We were successful at one point in healing the area on the right, and at the same time the area on the left was roughly half the size of current measurements. I think a lot of the deterioration has to do with the prolonged time the patient is on her feet at work 04/19/15 I attempted-like surface  debridement bilaterally she does not tolerate this. She tells me that she was in allergic care yesterday with extreme pain over  her left lateral malleolus/ankle and was told that she has an "sprain" 05/03/15; large bilateral venous insufficiency wounds over the medial malleolus/medial aspect of her ankles. She complains of copious amounts of drainage and his usual large amounts of pain. There is some increasing erythema around the wound on the right extending into the medial aspect of her foot to. historically she came in with these wounds the right one healed and the left one came down to roughly half its current size however the right one is reopened and the left is expanded. 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 05/10/15 large bilateral venous insufficiency wounds. There is less adherence surface left however the surface culture that I did last week grew pseudomonas therefore bilateral selective debridement score necessary. There is surrounding erythema. The patient describes severe bilateral drainage and a lot of pain in the left ankle. Apparently her podiatrist was were ready to do a cortisone shot 05/17/15; the patient complains of pain and again copious amounts of drainage. 05/24/15; we used Iodo flex last week. Patient notes considerable improvement in wound drainage. Only needed to change this once. 05/31/15; we continued Iodoflex; the base of these large wounds bilaterally is not too bad but there is probably likely a significant bioburden here. I would like to debridement just doesn't tolerate it. 06/06/14 I would like to continue the Iodoflex although she still hasn't managed to obtain supplies. She has bilateral medial malleoli or large wounds which are mostly superficial. Both of them are covered circumferentially with some nonviable fibrinous slough although she tolerates debridement very poorly. She apparently has an appointment for  an ablation on the right leg by interventional radiology. 06/14/15; the patient arrives with the wounds and static condition. We attempted a debridement although she does not do well with this secondary to pain. I 07/05/15; wounds are not much smaller however there appears to be a cleaner granulating base. The left has tight fibrinous slough greater than the right. Debridement is tolerated poorly due to pain. Iodoflex is done more for these wounds in any of the multitude of different dressings I have tried on the left 1 and then subsequently the right. 07/12/15; no change in the condition of this wound. I am able to do an aggressive debridement on the right but not the left. She simply cannot tolerate it. We have been using Iodoflex which helps somewhat. It is worthwhile remembering that at one point we healed the right medial ankle wound and the left was about 25% of the current circumference. We have suggested returning to vascular surgery for review of possible further ablations for one reason or another she has not been able to do this. 07/26/15 no major change in the condition of either wound on her medial ankle. I did not attempt to debridement of these. She has been aggressively scrubbing these while she is in the shower at home. She has her supply of Iodoflex which seems to have done more for these wounds then anything I have put on recently. 08/09/15 wound area appears larger although not verified by measurements. Using Iodoflex 09/05/2015 -- she was here for avisit today but had significant problems with the wound and I was asked to see her for a physician opinion. I have summarize that this lady has had surgery on her left lower extremity about 10 years ago where the possible veins stripping was done. She has had an opinion from  interventional radiology around November 2016 where no further sclerotherapy was ordered. The patient works 12 hours a day and stands on a concrete floor with work boots  and is unable to get the proper compression she requires and cannot elevate her limbs appropriately at any given time. She has recently grown Pseudomonas from her wound culture but has not started her ciprofloxacin which was called in for her. 09/13/15 this continues to be a difficult situation for this patient. At one point I had this wound down to a 1.5 x 1.5" wound on her left leg. This is deteriorated and the right leg has reopened. She now has substantial wounds on her medial calcaneus, malleoli and into her lower leg. One on the left has surface eschar but these are far too painful for me to debridement here. She has a vascular surgery appointment next week to see if anything can be done to help here. I think she has had previous ablations several years ago at [facility]. She has no major edema. She tells me that she did not get product last time [facility]] and went for several days without it. She continues to work in work boots 12 hours a day. She cannot get compression/4-layer under her work boots. 09/20/15 no major change. Periwound edema control was not very good. Her point with pain and vascular is next Wednesday the 25th 09/28/15; the patient is seen vascular surgery and is apparently scheduled for repeat duplex ultrasounds of her bilateral lower legs next week. 10/05/15; the patient was seen by Dr. Doren Custard of vascular surgery. He feels that she should have arterial insufficiency excluded as cause/contributed to her nonhealing stage she is therefore booked for an arteriogram. She has apparently monophasic signals in the dorsalis pedis pulses. She also of course has known severe chronic venous insufficiency with previous procedures as noted previously. I had another long discussion with the patient today about her continuing to work 12 hour shifts. I've written her out for 2 months area had concerns about this as her work location is currently undergoing significant turmoil and this may lead  to her termination. She is aware of this however I agree with her that she simply cannot continue to stand for 12 hours multiple days a week with the substantial wound areas she has. 10/19/15; the Dr. Doren Custard appointment was largely for an arteriogram which was normal. She does not have an arterial issue. He didn't make a comment about her chronic venous insufficiency for which she has had previous ablations. Presumably it was not felt that anything additional could be done. The patient is now out of work as I prescribed 2 weeks ago. Her wounds look somewhat less aggravated presumably because of this. I felt I would give debridement another try today 10/25/15; no major change in this patient's wounds. We are struggling to get her product that she can afford into her own home through her insurance. 11/01/15; no major change in the patient's wounds. I have been using silver alginate as the most affordable product. I spoke to Dr. Marla Roe last week with her requested take her to the OR for surgical debridement and placement of ACEL. Dr. Marla Roe told me that she would be willing to do this however [facility] will not cover this, fortunately the patient has [place] healthcare of some variant 11/08/15; no major change in the patient's wounds. She has been completely nonviable surface that this but is in too much pain with any attempted debridement are clinic.  I have arranged for her to see Dr. Marla Roe ham of plastic surgery and this appointment is on Monday. I am hopeful that they will take her to the OR for debridement, possible ACEL ultimately possible skin graft 11/22/15 no major change in the patient's wounds over her bilateral medial calcaneus medial malleolus into the lower legs. Surface on these does not look too bad however on the left there is surrounding erythema and tenderness. This may be cellulitis or could him sleepy tinea. 11/29/15; no major changes in the patient's wounds over her  bilateral medial malleolus. There is no infection here and I don't think any additional antibiotics are necessary. There is now plan to move forward. She sees Dr. Marla Roe in a week's time for preparation for operative debridement and ACEL placement I believe on 7/12. She then has a follow-up appointment with Dr. Marla Roe on 7/21 12/28/15; the patient returns today having been taken to the [place] by Dr. Marla Roe 12/12/15 she underwent debridement, intraoperative cultures [which were negative]. She had placement of a wound VAC. Parent really ACEL was not available to be placed. The wound VAC foam apparently adhered to the wound since then she's been using silver alginate, Xeroform under Ace wraps. She still says there is a lot of drainage and a lot of pain 01/31/16; this is a patient I see monthly. I had referred her to Dr. Marla Roe him of plastic surgery for large wounds on her bilateral medial ankles. She has been to the OR twice once in early July and once in early August. She tells me over the last 3 weeks she has been using the wound VAC with ACEL underneath it. On the right we've simply been using silver alginate. Under Kerlix Coban wraps. 02/28/16; this is a patient I'm currently seeing monthly. She is gone on to have a skin graft over her large venous insufficiency ulcer on the left medial ankle. This was done by Dr. Marla Roe him. The patient is a bit perturbed about why she didn't have one on her right medial ankle wound. She has been using silver alginate to this. 03/06/16; I received a phone call from her plastic surgery Dr. Marla Roe. She expressed some concern about the viability of the skin graft she did on the left medial ankle wound. Asked me to place Endoform on this. She told me she is not planning to do a subsequent skin graft on the right as the left one did not take very well. I had placed Hydrofera Blue on the right 03/13/16; continue to have a reasonably healthy wound on the  right medial ankle. Down to 3 mm in terms of size. There is epithelialization here. The area on the left medial ankle is her skin graft site. I suppose the last week this looks somewhat better. She has an open area inferiorly however in the center there appears to be some viable tissue. There is a lot of surface callus and eschar that will eventually need to come off however none of this looked to be infected. Patient states that the is able to keep the dressing on for several days which is an improvement. 03/20/16 no major change in the circumference of either wound however on the left side the patient was at Dr. Eusebio Friendly office and they did a debridement of left wound. 50% of the wound seems to be epithelialized. I been using Endoform on the left Hydrofera Blue in the right 03/27/16; she arrives today with her wound is not looking as healthy as  they did last week. The area on the right clearly has an adherent surface to this a very similar surface on the left. Unfortunately for this patient this is all too familiar problem. Clearly the Endoform is not working and will need to change that today that has some potential to help this surface. She does not tolerate debridement in this clinic very well. She is changing the dressing wants 04/03/16; patient arrives with the wounds looking somewhat better especially on the right. Dr. Migdalia Dk change the dressing to silver alginate when she saw her on Monday and also sold her some compression socks. The usefulness of the latter is really not clear and woman with severely draining wounds. 04/10/16; the patient is doing a bit of an experiment wearing the compression stockings that Dr. Migdalia Dk provided her to her left leg and the out of legs based dressings that we provided to the right. 05/01/16; the patient is continuing to wear compression stockings Dr. Migdalia Dk provided her on the left that are apparently silver impregnated. She has been using Iodoflex to the  right leg wound. Still a moderate amount of drainage, when she leaves here the wraps only last for 4 days. She has to change the stocking on the left leg every night 05/15/16; she is now using compression stockings bilaterally provided by Dr. Marla Roe. She is wearing a nonadherent layer over the wounds so really I don't think there is anything specific being done to this now. She has some reduction on the left wound. The right is stable. I think all healing here is being done without a specific dressing 06/09/16; patient arrives here today with not much change in the wound certainly in diameter to large circular wounds over the medial aspect of her ankle bilaterally. Under the light of these services are certainly not viable for healing. There is no evidence of surrounding infection. She is wearing compression stockings with some sort of silver impregnation as prescribed by Dr. Marla Roe. She has a follow-up with her tomorrow. 06/30/16; no major change in the size or condition of her wounds. These are still probably covered with a nonviable surface. She is using only her purchase stockings. She did see Dr. Marla Roe who seemed to want to apply Dakin's solution to this I'm not extreme short what value this would be. I would suggest Iodoflex which she still has at home. 07/28/16; I follow Mrs. Bonham episodically along with Dr. Marla Roe. 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 She has been applying a topical collagen ointment to both wounds with Adaptic. I don't think Dr. Marla Roe is planning to take her back to the OR. 08/19/16; I follow Mrs. Jeneen Rinks on a monthly basis along with Dr. Marla Roe of plastic surgery. 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 I been following her for a number of years. At one point I was able to get the right medial malleolus wound  to heal and had the left medial malleolus down to about half its current size however and I had to send her to plastic surgery for an operative debridement. Since then things have been stable to slightly improve the area on the right is slightly better one in the left about the same although there is much less adherent surface than I'm used to with this patient. She is using some form of liquid collagen gel that Dr. Marla Roe provided a Kerlix cover with the  patient's own pressure stockings. She tells me that she has extreme pain in both ankles and along the lateral aspect of both feet. She has been unable to work for some period of time. She is telling me she is retiring at the beginning of April. She sees Dr. Doran Durand of orthopedics next week 09/22/16; patient has not seen Dr. Marla Roe since the last time she is here. I'm not really sure what she is using to the wounds other than bits and pieces of think she had left over including most recently Hydrofera Blue. She is using juxtalite stockings. She is having difficulty with her husband's recent illness "stroke". She is having to transport him to various doctors appointments. Dr. Marla Roe left her the option of a repeat debridement with ACEL however she has not been able to get the time to follow-up on this. She continues to have a fair amount of drainage out of these wounds with certainly precludes leaving dressings on all week 10/13/16; patient has not seen Dr. Marla Roe since she was last in our clinic. I'm not really sure what she is doing with the wounds, we did try to get her [facility] and I think she is actually using this most of the time. Because of drainage she states she has to change this every second day although this is an improvement from what she used to do. She went to see Dr. Doran Durand who did not think she had a muscular issue with regards to her feet, he referred her to a neurologist and I think the appointment is sometime in  June. I changed her back to Iodoflex which she has used in the past but not recently. 11/03/16; the patient has been using Iodoflex although she ran out of this. Still claims that there is a lot of drainage although the wound does not look like this. No surrounding erythema. She has not been back to see Dr. Marla Roe 11/24/16; the patient has been using Iodoflex again but she ran out of it 2 or 3 days ago. There is no major change in the condition of either one of these wounds in fact they are larger and covered in a thick adherent surface slough/nonviable tissue especially on the left. She does not tolerate mechanical debridement in our clinic. Going back to see Dr. Marla Roe of plastic surgery for an operative debridement would seem reasonable. 12/15/16; the patient has not been back to see Dr. Marla Roe. She is been dealing with a series of illnesses and her husband which of monopolized her time. She is been using Sorbact which we largely supplied. She states the drainage is bad enough that it maximum she can go 2-3 days without changing the dressing 01/12/2017 -- the patient has not been back for about 4 weeks and has not seen Dr. Marla Roe not does she have any appointment pending. 01/23/17; patient has not seen Dr. Marla Roe even though I suggested this previously. She is using Santyl that was suggested last week by Dr. Con Memos this Cost her $16 through her insurance which is indeed surprising 02/12/17; continuing Santyl and the patient is changing this daily. A lot of drainage. She has not been back to see plastic surgery she is using an Ace wrap. Our intake nurse suggested wrap around stockings which would make a good reasonable alternative 02/26/17; patient is been using Santyl and changing this daily due to drainage. She has not been to see plastic surgery she uses in April Ace wrap to control the edema. She did obtain  extremitease stockings but stated that the edema in her leg was to big for  these 03/20/17; patient is using Santyl and Anasept. Surfaces looked better today the area on the right is actually measuring a little smaller. She has states she has a lot of pain in her feet and ankles and is asking for a consult to pain control which I'll try to help her with through our case manager. 04/10/17; the patient arrives with better-looking wound surfaces and is slightly smaller wound on the left she is using a combination of Santyl and Anasept. She has an appointment or at least as started in the pain control center associated with [facility] 05/14/17; this is a patient who I followed for a prolonged period of time. She has venous insufficiency ulcers on her bilateral medial ankles. At one point I had this down to a much smaller wound on the left however these reopened and we've never been able to get these to heal. She has been using Santyl and Anasept gel although 2 weeks ago she ran out of the Anasept gel. She has a stable appearance of the wound. She is going to the wound care clinic at [facility]. They wanted do a nerve block/spinal block although she tells me she is reluctant to go forward with that. 05/21/17; this is a patient I have followed for many years. She has venous insufficiency ulcers on her bilateral medial ankles. Chronic pain and deformity in her ankles as well. She is been to see plastic surgery as well as orthopedics. Using PolyMem AG most recently/Kerramax/ABDs and 2 layer compression. She has managed to keep this on and she is coming in for a nurse check to change the dressing on Tuesdays, we see her on Fridays 06/05/17; really quite a good looking surface and the area especially on the right medial has contracted in terms of dimensions. Well granulated healthy-looking tissue on both sides. Even with an open curet there is nothing that even feels abnormal here. This is as good as I've seen this in quite some time. We have been using PolyMem AG and  bringing her in for a nurse check 06/12/17; really quite good surface on both of these wounds. The right medial has contracted a bit left is not. We've been using PolyMem and AG and she is coming in for a nurse visit 06/19/17; we have been using PolyMem AG and bringing her in for a nurse check. Dimensions of her wounds are not better but the surfaces looked better bilaterally. She complained of bleeding last night and the left wound and increasing pain bilaterally. She states her wound pain is more neuropathic than just the wounds. There was some suggestion that this was radicular from her pain management doctor in talking to her it is really difficult to sort this out. 06/26/17; using PolyMem and AG and bringing her in for a nurse check as All of this and reasonably stable condition. Certainly not improved. The dimensions on the lateral part of the right leg look better but not really measuring better. The medial aspect on the left is about the same. 07/03/16; we have been using PolyMen AG and bringing her in for a nurse check to change the dressings as the wounds have drainage which precludes once weekly changing. We are using all secondary absorptive dressings.our intake nurse is brought up the idea of using a wound VAC/snap VAC on the wound to help with the drainage to see if this would result in some contraction.  This is not a bad idea. The area on the right medial is actually looking smaller. Both wounds have a reasonable-looking surface. There is no evidence of cellulitis. The edema is well controlled 07/10/17; the patient was denied for a snap VAC by her insurance. The major issue with these wounds continues to be drainage. We are using wicked PolyMem AG and she is coming in for a nurse visit to change this. The wounds are stable to slightly improved. The surface looks vibrant and the area on the right certainly has shrunk in size but very slowly 07/17/17; the patient still has large wounds on her  bilateral medial malleoli. Surface of both of these wounds looks better. The dimensions seem to come and go but no consistent improvement. There is no epithelialization. We do not have options for advanced treatment products due to insurance issues. They did not approve of the wound VAC to help control the drainage. More recently we've been using PolyMem and AG wicked to allow drainage through. We have been bringing her in for a nurse visit to change this. We do not have a lot of options for wound care products and the home again due to insurance issues 07/24/17; the patient's wound actually looks somewhat better today. No drainage measurements are smaller still healthy-looking surface. We used silver collagen under PolyMen started last week. We have been bringing her in for a dressing change 07/31/17; patient's wound surface continued to look better and I think there is visible change in the dimensions of the wound on the right. Rims of epithelialization. We have been using silver collagen under PolyMen and bringing her in for a dressing change. There appears to be less drainage although she is still in need of the dressing change 08/07/17. Patient's wound surface continues to look better on both sides and the area on the right is definitely smaller. We have been using silver collagen and PolyMen. She feels that the drainage has been it has been better. I asked her about her vascular status. She went to see Dr. Aleda Grana at [facility] and had some form of ablation. I don't have much detail on this. I haven't my notes from 2016 that she was not a candidate for any further ablation but I don't have any more information on this. We had referred her to vein and vascular I don't think she ever went. He does not have a history of PAD although I don't have any information on this either. We don't even have ABIs in our record 08/14/17; we've been using silver collagen and PolyMen cover. And putting the  patient and compression. She we are bringing her in as a nurse visit to change this because ofarge amount of drainage. We didn't the ABIs in clinic today since they had been done in many moons 1.2 bilaterally. She has been to see vein and vascular however this was at [facility] and she had ablation although I really don't have any information on this all seemed biking get a report. She is also been operatively debrided by plastic surgery and had a cell placed probably 8-12 months ago. This didn't have a major effect. We've been making some gains with current dressings 08/19/17-She is here in follow-up evaluation for bilateral medial malleoli ulcers. She continues to tolerate debridement very poorly. We will continue with recently changed topical treatment; if no significant improvement may consider switching to Iodosorb/Iodoflex. 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 She has been  using silver collagen and PolyMem. I believe she has been used and tried on Iodoflex before. 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 These have been very refractory over time. We have been using silver collagen and PolyMen. She is coming in once a week for a doctor's and once a week for nurse visits. We are actually making some progress 09/18/17; the patient's wounds are smaller especially on the right medial. She arrives today to upset to consider even washing these off with Anasept which I think is been part of the reason this is been closing. We've been using collagen covered in PolyMen otherwise. It is noted that she has a small area of folliculitis on the right medial calf that. As we are wrapping her legs I'll give her a short course of doxycycline to make sure this doesn't  amount to anything. She is a long list of complaints today including imbalance, shortness of breath on exertion, inversion of her left ankle. With regards to the latter complaints she is been to see orthopedics and they offered her a tendon release surgery I believe but wanted her wounds to be closed first. I have recommended she go see her primary physician with regards to everything else. 09/25/17; patient's wounds are about the same size. We have made some progress bilaterally although not in recent weeks. She will not allow me T wash these wounds with Anasept even if she is doing her cell. Wheeze we've been using collagen covered in PolyMen. Last week she had a small area of folliculitis this is now opened into a small wound. She completed 5 days of trimethoprim sulfamethoxazole 10/02/17; unfortunately the area on her left medial ankle is worse with a larger wound area towards the Achilles. The patient complains of a lot of pain. She will not allow debridement although visually I don't think there is anything to debridement in any case. We have been using silver collagen and PolyMen for several months now. Initially we are making some progress although I'm not really seeing that today. We will move back to [facility]. His admittedly this is a bit of a repeat however I'm hoping that his situation is different now. The patient tells me she had her leg on the left give out on her yesterday this is process some pain. 10/09/17; the patient is seen twice a week largely because of drainage issues coming out of the chronic medial bimalleolar wounds that are chronic. Last week the dimensions of the one on the left looks a little larger I changed her to [facility]. She comes in today with a history of terrible pain in the bilateral wound areas. She will not allow debridement. She will not even allow a tissue culture. There is no surrounding erythema no no evidence of cellulitis. We have been putting her  Kerlix Coban man. She will not allow more aggressive compression as there was a suggestion to put her in 3 layer wraps. 10/16/17; large wounds on her bilateral medial malleoli. These are chronic. Not much change from last week. The surface looks have healthy but absolutely no epithelialization. A lot of pain little less so of drainage. She will not allow debridement or even washing these off in the vigorous fashion with Anasept. 10/23/17; large wounds on her bilateral malleoli which are chronic. Some improvement  in terms of size perhaps on the right since last time I saw these. She states that after we increased the 3 layer compression there was some bleeding, when she came in for a nurse visit she did not want 3 layer compression put back on about our nurse managed to convince her. She has known chronic venous visit issues and I'm hoping to get her to tolerate the 3 layer compression. using Hydrofera Blue 10/30/17; absolutely no change in the condition of either wound although we've had some improvement in dimensions on the right.. Attempted to put her in 3 layer compression she didn't tolerated she is back in 2 layer compression. We've been using Hydrofera Blue We looked over her past records. She had venous reflux studies in November 2016. There was no evidence of deep venous reflux on the right. Superficial vein did not show the greater saphenous vein at think this is been previously ablated the small saphenous vein was within normal limits. The left deep venous system showed no DVT the vessels were positive for deep venous reflux in the posterior tibial veins at the ankle. The greater saphenous vein was surgically absent small saphenous vein was within normal limits. She went to vein and vascular at [facility]. I believe she had an ablation on the left greater saphenous vein. I'll update her reflux studies perhaps ever reviewed by vein and vascular. We've made absolutely no progress in these  wounds. Will also try to read and TheraSkins through her insurance 11/06/17; W the patient apparently has a 2 week follow-up with vein and vascular I like him to review the whole issue with regards to her previous vascular workup by Dr. Aleda Grana. We've really made no progress on these wounds in many months. She arrives today with less viable looking surface on the left medial ankle wound. This was apparently looking about the same on Tuesday when she was here for nurse visit. 11/13/17; deep tissue culture I did last time of the left lower leg showed multiple organisms without any predominating. In particular no Staphylococcus or group A strep were isolated. We sent her for venous reflux studies. She's had a previous left greater saphenous vein stripping and I think sclerotherapy of the right greater saphenous vein. She didn't really look at the lesser saphenous vein this both wounds are on the medial aspect. She has reflux in the common femoral vein and popliteal vein and an accessory vein on the right and the common femoral vein and popliteal vein on the left. I'm going to have her go to see vein and vascular just the look over things and see if anything else beside aggressive compression is indicated here. We have not been able to make any progress on these wounds in spite of the fact that the surface of the wounds is never look too bad. 11/20/17; no major change in the condition of the wounds. Patient reports a large amount of drainage. She has a lot of complaints of pain although enlisting her today I wonder if some of this at least his neuropathic rather than secondary to her wounds. She has an appointment with vein and vascular on 12/30/17. The refractory nature of these wounds in my mind at least need vein and vascular to look over the wounds the recent reflux studies we did and her history to see if anything further can be done here. I also note her gait is deteriorated quite a bit. Looks like  she has inversion of her foot on the  right. She has a bilateral Trendelenburg gait. I wonder if this is neuropathic or perhaps multilevel radicular. 11/27/17; her wounds actually looks slightly better. Healthy-looking granulation tissue a scant amount of epithelialization. [place] healthcare will not pay for [employer]. They will play for tri layer Oasis and Dermagraft. This is not a diabetic ulcer. We'll try for the tri layer Oasis. She still complains of some drainage. She has a vein and vascular appointment on 12/30/17 12/04/17; the wounds visually look quite good. Healthy-looking granulation with some degree of epithelialization. We are still waiting for response to our request for trial to try layer Oasis. Her appointment with vascular to review venous and arterial issues isn't sold the end of July 7/31. Not allow debridement or even vigorous cleansing of the wound surface. 12/18/17; slightly smaller especially on the right. 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 We've been using Hydrofera Blue. We still are looking into triple layer Oasis through her insurance 01/08/18 on evaluation today patient's wound actually appears to be showing signs of good improvement at this point in time. She has been tolerating the dressing changes without complication. Fortunately there does not appear to be any evidence of infection at this point in time. We have been utilizing silver nitrate which does seem to be of benefit for her which is also good news. Overall I'm very happy with how things seem to be both regards appearance as well as measurement. Patient did see Dr. Bridgett Larsson for evaluation on 12/30/17. In his assessment he felt that stripping would not likely add much more than chronic compression to the patient's healing process. His recommendation was to follow-up in three months with Dr. Doren Custard if she hasn't healed in order to consider referral back to you and see vascular where she  previously was in a trial and was able to get her wound to heal. I'll be see what she feels she when you staying compression and he reiterated this as well. 01/13/18 on evaluation today patient appears to actually be doing very well in regard to her bilateral medial malleolus ulcers. She seems to have tolerated the chemical cauterization with silver nitrate last week she did have some pain through that evening but fortunately states that I'll be see since it seems to be doing better she is overall pleased with the progress. 01/21/18; really quite a remarkable improvement since I've last seen these wounds. We started using silver nitrate specially on the islands of hyper granulation which for some reason her around the wound circumference. This is really done quite nicely. Primary dressing Hydrofera Blue under 4 layer compression. She seems to be able to hold out without a nurse rewrap. Follow-up in 1 week 01/28/18; we've continued the hydrofera blue but continued with chemical cauterization to the wound area that we started about a month ago for irregular hyper granulation. She is made almost stunning improvement in the overall wound dimensions. I was not really expecting this degree of improvement in these chronic wounds 02/05/18; we continue with Hydrofera Bluebut of also continued the aggressive chemical cauterization with silver nitrate. We made nice progress with the right greater than left wound. 02/12/18. We continued with Hydrofera Blue after aggressive chemical cauterization with silver nitrate. We appear to be making nice progress with both wound areas 02/19/2018; we continue with [facility] after washing the wounds vigorously with Anasept spray and chemical cauterization with silver nitrate. We are making excellent progress. The area on the right's just about closed 02/26/2018. The area on the  left medial ankle had too much necrotic debris today. I used a #5 curette we are able to get most of  the soft. I continued with the silver nitrate to the much smaller wound on the right medial ankle she had a new area on her right lower pretibial area which she says was due to a role in her compression 03/05/2018; both wound areas look healthy. Not much change in dimensions from last week. I continue to use silver nitrate and Hydrofera Blue. The patient saw Dr. Doren Custard of vein and vascular. He felt she had venous stasis ulcers. He felt based on her previous arteriogram she should have adequate circulation for healing. Also she has deep venous reflux but really no significant correctable superficial venous reflux at this time. He felt we should continue with conservative management including leg elevation and compression 04/02/2018; since we last saw this woman about a month ago she had a fall apparently suffered a pelvic fracture. I did not look up the x-ray. Nevertheless because of pain she literally was bedbound for 2 weeks and had home health coming out to change the dressing. Somewhat predictably this is resulted in considerable improvement in both wound areas. The right is just about closed on the medial malleolus and the left is about half the size. 04/16/2018; both her wounds continue to go down in size. Using Hydrofera Blue. 05/07/18; both her wounds appeared to be improving especially on the right where it is almost closed. We are using Hydrofera Blue 05/14/2018; slightly worse this week with larger wounds. Surface on the left medial not quite as good. We have been using Hydrofera Blue 05/21/18; again the wounds are slightly larger. Left medial malleolus slightly larger with eschar around the circumference. We have been using Hydrofera Blue undergoing a wraps for a prolonged period of time. This got a lot better when she was more recumbent due to a fall and a back injury. I change the primary dressing the silver alginate today. She did not tolerate a 4 layer compression previously although I may  need to bring this up with her next time 05/28/2018; area on the left medial malleolus again is slightly larger with more drainage. Area on the right is roughly unchanged. She has a small area of folliculitis on the right medial just on the lower calf. This does not look ominous. 06/03/2018 left medial malleolus slightly smaller in a better looking surface. We used silver nitrate on this last time with silver alginate. The area on the right appears slightly smaller 1/10; left medial malleolus slightly smaller. Small open area on the right. We used silver nitrate and silver alginate as of 2 weeks ago. We continue with the wound and compression. These got a lot better when she was off her feet 1/17; right medial malleolus wound is smaller. The left may be slightly smaller. Both surfaces look somewhat better. 1/24; both wounds are slightly smaller. Using silver alginate under Unna boots 1/31; both wounds appear smaller in fact the area on the right medial is just about closed. Surface eschar. We have been using silver alginate under Unna boots. The patient is less active now spends let much less time on her feet and I think this is contributed to the general improvement in the wound condition 2/7; both wounds appear smaller. I was hopeful the right medial would be closed however there there is still the same small open area. Slight amount of surface eschar on the left the dimensions are smaller there is  eschar but the wound edges appear to be free. We have been using silver alginate under Unna boot's 2/14; both wounds once again measure smaller. Circumferential eschar on the left medial. We have been using silver alginate under Unna boots with gradual improvement 2/21; the area on the right medial malleolus has healed. The area on the left is smaller. We have been using silver alginate and Unna boots. We can discharge wrapping the right leg she has 20/30 stockings at home she will need to protect the scar  tissue in this area 2/28; the area on the right medial malleolus remains closed the patient has a compression stocking. The area on the left is smaller. We have been using silver alginate and Unna boots. 3/6 the area on the right medial ankle remains closed. Good edema control noted she is using her own compression stocking. The area on the left medial ankle is smaller. We have been managing this with silver alginate and Unna boots which we will continue today. 3/13; the area on the right medial ankle remains closed and I'm declaring it healed today. When necessary the left is about the same still a healthy-looking surface but no major change and wound area. No evidence of infection and using silver alginate under unna and generally making considerable improvement 3/27 the area on the right medial ankle remains closed the area on the left is about the same as last week. Certainly not any worse we have been using silver alginate under an Unna boot 4/3; the area on the right medial ankle remains closed per the patient. We did not look at this wound. The wound on the left medial ankle is about the same surface looks healthy we have been using silver alginate under an Unna boot 4/10; area on the right medial ankle remains closed per the patient. We did not look at this wound. The wound on the left medial ankle is slightly larger. The patient complains that the [facility] caused burning pain all week. She also told us that she was a lot more active this week. Changed her back to silver alginate 4/17; right medial ankle still closed per the patient. Left medial ankle is slightly larger. Using silver alginate. She did not tolerate Hydrofera Blue on this area 4/24; right medial ankle remains closed we have not look at this. The left medial ankle continues to get larger today by about a centimeter. We have been using silver alginate under Unna boots. She complains about 4 layer compression as an  alternative. She has been up on her feet working on her garden 5/8; right medial ankle remains closed we did not look at this. The left medial ankle has increased in size about 100%. We have been using silver alginate under Unna boots. She noted increased pain this week and was not surprised that the wound is deteriorated 5/15; no major change in SA however much less erythema ( one week of doxy ocellulitis). 5/22-65 year old female returns at 1 week to the clinic for left medial ankle wound for which we have been using silver alginate under 3 layer compression She was placed on DOXY at last visit - the wound is wider at this visit. She is in 3 layer compression 5/29; change to [facility] last week. I had given her empiric doxycycline 2 weeks ago for a week. She is in 3 layer compression. She complains of a lot of pain and drainage on presentation today. 6/5; using Hydrofera Blue. I gave her doxycycline recently  empirically for erythema and pain around the wound. Believe her cultures showed enterococcus which not would not have been well covered by doxycycline nevertheless the wound looks better and I don't feel specifically that the enterococcus needs to be covered. She has a new what looks like a wrap injury on her lateral left ankle. 6/12; she is using Hydrofera Blue. She has a new area on the left anterior lower tibial area. This was a wrap injury last week. 6/19; the patient is using Hydrofera Blue. She arrived with marked inflammation and erythema around the wound and tenderness. 12/01/18 on evaluation today patient appears to be doing a little bit better based on what I'm hearing from the standpoint of lassos evaluation to this as far as the overall appearance of the wound is concerned. Then sometime substandard she typically sees Dr. Dellia Nims. Nonetheless overall very pleased with the progress that she's made up to this point. No fevers, chills, nausea, or vomiting noted at this time. 7/10;  some improvement in the surface area. Aggressively debrided last week apparently. I went ahead with the debridement today although the patient does not tolerate this very well. We have been using Iodoflex. Still a fair amount of drainage 7/17; slightly smaller. Using Iodoflex. 7/24; no change from last week in terms of surface area. We have been using Iodoflex. Surface looks and continues to look somewhat better 7/31; surface area slightly smaller better looking surface. We have been using Iodoflex. This is under Unna boot compression 8/7-Patient presents at 1 week with Unna boot and Iodoflex, wound appears better 8/14-Patient presents at 1 week with Iodoflex, we use the Unna boot, wound appears to be stable better.Patient is getting Botox treatment for the inversion of the foot for tendon release, Next week 8/21; we are using Iodoflex. Unna boot. The wound is stable in terms of surface area. Under illumination there is some areas of the wound that appear to be either epithelialized or perhaps this is adherent slough at this point I was not really clear. It did not wipe off and I was reluctant to debride this today. 8/28; we are using Iodoflex in an Unna boot. Seems to be making good improvement. 9/4; using Iodoflex and wound is slightly smaller. 9/18; we are using Iodoflex with topical silver nitrate when she is here. The wound continues to be smaller 10/2; patient missed her appointment last week due to GI issues. She left and Iodoflex based dressing on for 2 weeks. Wound is about the same size about the size of a dime on the left medial lower 10/9 we have been using Iodoflex on the medial left ankle wound. She has a new superficial probable wrap injury on the dorsal left ankle 10/16; we have been using Hydrofera Blue since last week. This is on the left medial ankle 10/23; we have been using Hydrofera Blue since 2 weeks ago. This is on the left medial ankle. Dimensions are better 11/6; using  Hydrofera Blue. I think the wound is smaller but still not closed. Left medial ankle 11/13; we have been using Hydrofera Blue. Wound is certainly no smaller this week. Also the surface not as good. This is the remanent of a very large area on her left medial ankle. 11/20; using Sorbact since last week. Wound was about the same in terms of size although I was disappointed about the surface debris 12/11; 3-week follow-up. Patient was on vacation. Wound is measuring slightly larger we have been using Sorbact. 12/18; wound is about the  same size however surface looks better last week after debridement. We have been using Sorbact under compression 1/15 wound is probably twice the size of last time increased in length nonviable surface. We have been using Sorbact. She was running a mild fever and missed her appointment last week 1/22; the wound is come down in size but under illumination still a very adherent debris we have been Hydrofera Blue that I changed her to last week 1/29; dimensions down slightly. We have been using Hydrofera Blue 2/19 dimensions are the same however there is rims of epithelialization under illumination. Therefore more the surface area may be epithelialized 2/26; the patient's wound actually measures smaller. The wound looks healthy. We have been using Hydrofera Blue. I had some thoughts about running Apligraf then I still may do that however this looks so much better this week we will delay that for now 3/5; the wound is small but about the same as last week. We have been using Hydrofera Blue. No debridement is required today. 3/19; the wound is about the size of a dime. Healthy looking wound even under illumination. We have been using Hydrofera Blue. No mechanical debridement is necessary 3/26; not much change from last week although still looks very healthy. We have been using Hydrofera Blue under Unna boots Patient was offered an ankle fusion by podiatry but not until the  wound heals with a proceed with this. 4/9; the patient comes in today with her original wound on the medial ankle looking satisfactory however she has some uncontrolled swelling in the middle part of her leg with 2 new open areas superiorly just lateral to the tibia. I think this was probably a wrap issue. She said she felt uncomfortable during the week but did not call in. We have been using Hydrofera Blue 4/16; the wound on the medial ankle is about the same. She has innumerable small areas superior to this across her mid tibia. I think this is probably folliculitis. She is also been working in the yard doing a lot of sweating 4/30; the patient issue on the upper areas across her mid tibia of all healed. I think this was excessive yard work if I remember. Her wound on the medial ankle is smaller. Some debris on this we have been using Hydrofera Blue under Unna boots 5/7; mid tibia. She has been using Hydrofera Blue under an Unna wrap. She is apparently going for her ankle surgery on June 3 10/28/19-Patient returns to clinic with the ankle wound, we are using Hydrofera Blue under Unna wrap, surgery is scheduled for her left foot for June 3 so she will be back for nurse visit next week READMISSION 01/17/2020 Mrs. Souffrant is a 65 year old woman we have had in this clinic for a long period of time with severe venous hypertension and refractory wounds on her medial lower legs and ankles bilaterally. 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 When she was off her legs for a prolonged period example when she fell and suffered a compression fracture things would heal up quite nicely. She is now retired and we managed to heal up the right medial leg wound. The left one was very tiny last time I saw this although still refractory. She had an additional problem with inversion of her ankle  which was a complicated process largely a result of peripheral neuropathy.  It got to the point where this was interfering with her walking and she elected to proceed with a ankle arthrodesis to straighten her her ankle and leave her with a functional outcome for mobilization. The patient was referred to Dr. Doren Custard and really this took some time to arrange. Dr. Doren Custard saw her on 12/07/2019. Once again he verified that she had no arterial issues. She had previously had an angiogram several years ago. Follow-up ABIs on the left showed an ABI of 1.12 with triphasic waveforms and a TBI of 0.92. She is felt to have chronic deep venous insufficiency but I do not think it was felt that anything could be done from about this from an ablation point of view. At the time Dr. Doren Custard saw this patient the wounds actually look closed via the pictures in his clinic. The patient finally underwent her surgery on 12/15/2019. This went reasonably well and there was a good anatomic outcome. She developed a small distal wound dehiscence on the lateral part of the surgical wound. However more problematically she is developed recurrence of the wound on the medial left ankle. There are actually 2 wounds here one in the distal lower leg and 1 pretty much at the level of the medial malleolus. It is a more distal area that is more problematic. She has been using Hydrofera Blue which started on Friday before this she was simply Ace wrapping. There was a culture done that showed Pseudomonas and she is on ciprofloxacin. A recent CNS on 8/11 was negative. The patient reports some pain but I generally think this is improving. She is using a cam boot completely nonweightbearing using a walker for pivot transfers and a wheelchair 8/24; not much improvement unfortunately she has a surgical wound on the lateral part in the venous insufficiency wound medially. The bottom part of the medial insufficiency wound is still necrotic there is exposed  tendon here. We have been using Hydrofera Blue under compression. Her edema control is however better 8/31; patient in for follow-up of his surgical wound on the lateral part of her left leg and chronic venous insufficiency ulcers medially. We put her back in compression last week. She comes in today with a complaint of 3 or 4 days worth of increasing pain. She felt her cam walker was rubbing on the area on the back of her heel. However there is intense erythema seems more likely she has cellulitis. She had 2 cultures done when she was seeing podiatry in the postop. One of them in late July showed Pseudomonas and she received a course of ciprofloxacin the other was negative on 8/11 she is allergic to penicillin with anaphylactoid complaints of hives oral swelling via information in epic 9/9; when I saw this patient last week she had intense anterior erythema around her wound on the right lateral heel and ankle and also into the right medial heel. Some of this was no doubt drainage and her walker boot however I was convinced she had cellulitis. I gave her Levaquin and Bactrim she is finishing up on this now. She is following up with Dr. [name] he saw her yesterday. He is taken her out of the walking boot of course she is still nonweightbearing. Her x-ray was negative for any worrisome features such as soft tissue air etc. Things are a lot better this week. She has home health. We have been using Hydrofera Blue under an [employer] which she put back on yesterday. I did not wrap her last  week 9/17; her surrounding skin looks a lot better. In fact the area on the left lateral ankle has just a scant amount of eschar. The only remaining wound is the large area on the left medial ankle. Probably about 60% of this is healthy granulation at the surface however she has a significant divot distally. This has adherent debris in it. I been using debridement and silver collagen to try and get this area to fill-in  although I do not think we have made much progress this week 9/24; the patient's wound on the left medial ankle looks a lot better. The deeper divot area distally still requires debridement but this is cleaning up quite nicely we have been using silver collagen. The patient is complaining of swelling in her foot and is worried that that is contributing to the nonhealing of the ankle wound. She is also complaining of numbness in her anterior toes 10/4; left medial ankle. The small area distally still has a divot with necrotic material that I have been debriding away. This has an undermining area. She is approved for Apligraf. She saw Dr. [name] her surgeon on 10/1. I think he declared himself is satisfied with the condition of things. Still nonweightbearing till the end of the month. 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 Her surgical wound is well closed. There is no evidence of infection 10/11; the patient arrived in clinic today with the expectation that we be able to put an Apligraf on this area after debridement however she arrives with a relatively large amount of green drainage on the dressing. The patient states that this started on Friday. She has not been systemically unwell. 10/19; culture I did last week showed both Enterococcus and Pseudomonas. I think this came in separate parts because I stopped her ciprofloxacin I gave her and prescribed her linezolid however now looking at the final culture result this is Pseudomonas which is resistant to quinolones. She has not yet picked up the linezolid apparently phone issues. We are also trying to get a topical antibiotic out of [place] in [place] they can be applied by home health. She is still having green drainage 10/16; the patient has her topical antibiotic from [facility] in [place]. This is a compounded gel with vancomycin and ciprofloxacin and gentamicin. We are applying this on the wound  bed with silver alginate over the top with Unna boot wraps. She arrives in clinic today with a lot less ominous looking drainage although she is only use this topical preparation once the second time today. She sees Dr. [name] her surgeon on Friday she has home health changing the dressing 11/2; still using her compounded topical antibiotic under silver alginate. Surface is cleaning up there is less drainage. We had an Apligraf for her today and I elected to apply it. A light coating of her antibiotic 04/25/2020 upon evaluation today patient appears to be doing well with regard to her ankle ulcer. There is a little bit of slough noted on the surface of the wound I am can have to perform sharp debridement to clear this away today. With that being said other than that fact overall I feel like she is making progress and we do see some new epithelial growth. There is also some improvement in the depth of the wound and that distal portion. There is little bit of slough there as well. 12/7; 2-week follow-up. Apligraf #3. Dimensions are smaller. Closing in especially inferiorly. Still some  surface debris. Still using the [facility] topical antibiotic but I told her that I don't think this needs to be renewed 12/21; 2-week follow-up. Apligraf #4 dimensions are smaller. Nice improvement 06/05/2020; 2-week follow-up. The patient's wound on the left medial ankle looks really excellent. Nice granulation. Advancing epithelialization no undermining no evidence of infection. We would have to reapply for another Apligraf but with the condition of this wound I did not feel strongly about it. We used Hydrofera Blue under the same degree of compression. She follows up with Dr. [name] her surgeon a week Friday 06/13/2020 upon evaluation today patient appears to be doing excellent in regard to her wound. She has been tolerating the dressing changes without complication. Fortunately there is no signs of active infection at this  time. No fevers, chills, nausea, vomiting, or diarrhea. She was using Hydrofera Blue last week. 06/20/2020 06/20/2020 on evaluation today patient actually appears to be doing excellent in regard to her wound. This is measuring better and looking much better as well. She has been using the collagen that seems to be doing better for her as well even though the [facility] was and is not sticking or feeling as rough on her wound. She did see Dr. [name] on Friday he is very pleased he also stated none of the hardware has shifted. That is great news 1/27; the patient has a small clean wound all that is remaining. I agree that this is too small to really consider an Apligraf. Under illumination the surface is looking quite good. We have been using collagen although the dimensions are not any better this week 2/2; the patient has a small clean wound on the left medial ankle. Although this left of her substantial original areas. Measurements are smaller. We have been using polymen Ag under an Haematologist. 2/10; small area on the left medial ankle. This looks clean nothing to debride however dimensions are about the same we have been using polymen I think now for 2 weeks 2/17; not much change in surface area. We have been using polymen Ag without any improvement. 3/17; 1 month follow-up. The patient has been using endoform without any improvement in fact I think this looks worse with more depth and more expansion 3/24; no improvement. Perhaps less debris on the surface. We have been using Sorbact for 1 week 4/4; wound measures larger. She has edema in her leg and her foot which she tells as her wrap came down. We have been using Unna boots. Sorbact of the wound. She has been approved for Apligraf 09/12/2020 upon evaluation today patient appears to be doing well with regard to her wound. We did get the Apligraf reapproved which is great news we have that available for application today. Fortunately there is no  signs of infection and overall the patient seems to be doing great. The wound bed is nice and clean. 4/27; patient presents for her second application of Apligraf. She states over the past week she has been on her feet more often due to being outside in her garden. She has noted more swelling to her foot as a result. She denies increased warmth, pain or erythema to the wound site. 10/10/2020 upon evaluation today patient appears to be doing well with regard to her wound which does not appear to be quite as irritated as last week from what I am hearing. With that being said unfortunately she is having issues with some erythema and warmth to touch as well as an increase  size. I do believe this likely is infected. 10/17/2020 upon evaluation today patient appears to be doing excellent in regard to her wound this is significantly improved as compared to last week. Fortunately I think that the infection is much better controlled at this point. She did have evidence of both Enterococcus as well as Staphylococcus noted on culture. Enterococcus really would not be helped significantly by the Cipro but the wound is doing so much better I am under the assumption that the Staphylococcus is probably the main organism that is causing the current infection. Nonetheless I think that she is doing excellent as far as that is concerned and I am very pleased in that regard. I would therefore recommend she continue with the Cipro. 10/31/2020 upon evaluation today patient appears to be doing well with regard to her wound. She has been tolerating the dressing changes without complication. Fortunately there is no signs of active infection and overall I am extremely pleased with where things stand today. No fevers, chills, nausea, vomiting, or diarrhea. With that being said she does have some green drainage coming from the wound and although it looks okay I am a little concerned about the possibility of a continuing infection.  Specifically with Pseudomonas. For that reason I will go ahead and send in a prescription for Cipro for her to be continued. 11/14/2020 upon evaluation today patient appears to be doing very well currently in regard to her wound on her leg. She has been tolerating the dressing changes without complication. Fortunately I feel like the infection is finally under good control here. Unfortunately we do not have the Apligraf for application today although we can definitely order to have it in place for next week. That will be her fifth and final of the current series. Nonetheless I feel like her wound is really doing quite well which is great news. 11/21/2020 upon evaluation today patient appears to be doing well with regard to her wound on the medial ankle. Fortunately I think the infection is under control and I do believe we can go ahead and reapply the Apligraf today. She is in agreement with that plan. There does not appear to be any signs of active infection at this time which is great news. No fevers, chills, nausea, vomiting, or diarrhea. 12/05/2020 upon evaluation today patient's wound bed actually showed signs of good granulation epithelization at this point. There does not appear to be any signs of infection which is great news and overall very pleased with where things stand. Overall the patient seems to be doing fairly well in my opinion with regard to her wound although I do believe she continues to build up a lot of biofilm I think she could benefit from using PuraPly at this point. 12/12/2020 upon evaluation today patient's wound actually appears to be doing decently well today. The Unna boot has not been quite as well-tolerated so that more uncomfortable for her and even causing some pressure over the plate on the lateral portion of her foot which is 90 where the wound is. There did not appear to be any significant deep tissue injury with that there may be a minimal change in the skin noted I  think that we may want to go back to the Coflex 2 layer which is a little bit easier on her skin it seems. 12/19/2020 upon evaluation today patient actually seems to be making great progress with the PuraPly currently. She in fact seems to be much better as far as  the overall appearance of the wound bed is concerned I am very happy in this regard. I do not see any signs of of infection which is great news as well. No fevers, chills, nausea, vomiting, or diarrhea. 12/26/2020 upon evaluation today patient appears to actually be doing better in regard to her wound on the left medial ankle region. The surface of the wound is actually doing significantly better which is great news. There does not appear to be any signs of infection which is also great news and in general I am extremely pleased with where we stand today. 01/02/2021 upon evaluation today patient appears to be doing well with regard to her wound. In fact this is showing signs of excellent improvement and very pleased with where things stand. In fact the last 3 appointments have all shown signs of this getting smaller which is excellent news. I have not even had to perform any debridement and today is no exception. Overall I feel like this is dramatically improved compared to previous. T oday is PuraPly application #4. [id number] upon evaluation today patient appears to be doing excellent in regard to her wound this is continue to show signs of improvement and overall I am extremely pleased with where we stand today. She is actually here for PuraPly application #5. Every time we have applied this she is noted definite improvement on measurements. 01/23/2021 upon evaluation today patient is actually making good progress in regard to her wound. This was actually on just a little bit longer this time compared to previous due to the fact that she did have to go out of town. She is actually here for PuraPly application #6. We have definitely been seeing  improvements in the overall quality of the tissue on the surface of the wound which is awesome news. In general I think that the patient seems to be continuing to make great progress here. 01/30/2021 upon evaluation today patient's wound is actually doing excellent. There is really not any significant biofilm buildup which is great news and overall I am extremely pleased with where things stand today. There does not appear to be any signs of active infection. No fevers, chills, nausea, vomiting, or diarrhea. 02/06/2021 upon evaluation today patient's wound is actually showing signs of excellent improvement which is great news. We continue to see the benefit of the PuraPly this is doing a great job the wound seems not really irritated whatsoever and is showing signs of good granulation at this point. Overall I am extremely happy with what we are seeing. The patient likewise is happy to hear all of this as well. Electronic Signature(s) Signed: 02/06/2021 1:16:29 PM By: [name] PA-C Entered By: [name] on 02/06/2021 13:16:29 -------------------------------------------------------------------------------- Physical Exam Details Patient Name: Date of Service: [name], [name]. 02/06/2021 10:00 A M Medical Record Number: [id number] Patient Account Number: [id number] Date of Birth/Sex: Treating RN: 08-Dec-1955 (65 y.o. [name] Primary Care Provider: [name] Other Clinician: Referring Provider: Treating Provider/Extender: [name] in Treatment: 63 Constitutional Well-nourished and well-hydrated in no acute distress. Respiratory normal breathing without difficulty. Psychiatric this patient is able to make decisions and demonstrates good insight into disease process. Alert and Oriented x 3. pleasant and cooperative. Notes Upon inspection patient's wound bed showed signs of good granulation epithelization at this point. Fortunately there does  not appear to be any evidence of active infection which is great news and overall I am extremely pleased with  where we stand today. I did perform a light debridement in preparation for reapplying the PuraPly she tolerated that today without complication postdebridement wound bed appears to be doing significantly better. Electronic Signature(s) Signed: 02/06/2021 1:16:53 PM By: [name] PA-C Entered By: [name] on 02/06/2021 13:16:52 -------------------------------------------------------------------------------- Physician Orders Details Patient Name: Date of Service: [name], [name]. 02/06/2021 10:00 A M Medical Record Number: [id number] Patient Account Number: [id number] Date of Birth/Sex: Treating RN: 02/20/1956 (64 y.o. [name] Primary Care Provider: [name] Other Clinician: Referring Provider: Treating Provider/Extender: [name] in Treatment: 68 Verbal / Phone Orders: No Diagnosis Coding ICD-10 Coding Code Description [phone] Chronic venous hypertension (idiopathic) with ulcer and inflammation of left lower extremity L97.828 Non-pressure chronic ulcer of other part of left lower leg with other specified severity L97.328 Non-pressure chronic ulcer of left ankle with other specified severity Follow-up Appointments ppointment in 1 week. - with Margarita Grizzle Return A Cellular or Tissue Based Products Cellular or Tissue Based Product Type: - PuraPly: #6, 01/30/21 PuraPly #7, 02/06/21 purPLY #8 daptic or Mepitel. (DO NOT REMOVE). - no Cellular or Tissue Based Product applied to wound bed, secured with steri-strips, cover with A steristrips Bathing/ Shower/ Hygiene May shower with protection but do not get wound dressing(s) wet. Edema Control - Lymphedema / SCD / Other Elevate legs to the level of the heart or above for 30 minutes daily and/or when sitting, a frequency of: - throughout the day Avoid standing for long periods of  time. Exercise regularly Compression stocking or Garment 20-30 mm/Hg pressure to: - right leg daily Additional Orders / Instructions Follow Nutritious Diet Wound Treatment Wound #15 - Malleolus Wound Laterality: Left, Medial Cleanser: Soap and Water 1 x Per Week/30 Days Discharge Instructions: May shower and wash wound with dial antibacterial soap and water prior to dressing change. Peri-Wound Care: Triamcinolone 15 ([name]) 1 x Per Week/30 Days Discharge Instructions: Use triamcinolone 15 ([name]) as directed Peri-Wound Care: Sween Lotion (Moisturizing lotion) 1 x Per Week/30 Days Discharge Instructions: Apply moisturizing lotion as directed Prim Dressing: PuraPly AM ary 1 x Per Week/30 Days Secondary Dressing: Woven Gauze Sponge, Non-Sterile 4x4 in 1 x Per Week/30 Days Discharge Instructions: to bolster skin sub Secondary Dressing: ABD Pad, 5x9 1 x Per Week/30 Days Discharge Instructions: Apply over primary dressing and lateral ankle Secondary Dressing: Optifoam Non-Adhesive Dressing, 4x4 in 1 x Per Week/30 Days Discharge Instructions: Apply over primary dressing pad lateral foot with foam donut Secondary Dressing: ADAPTIC TOUCH 3x4.25 in 1 x Per Week/30 Days Discharge Instructions: Apply over skin sub Compression Wrap: CoFlex TLC XL 2-layer Compression System 4x7 (in/yd) 1 x Per Week/30 Days Discharge Instructions: Apply CoFlex 2-layer compression as directed. (alt for 4 layer) Electronic Signature(s) Signed: 02/06/2021 6:00:14 PM By: [name] PA-C Signed: 02/06/2021 6:09:03 PM By: Baruch Gouty RN, BSN Entered By: Baruch Gouty on 02/06/2021 10:56:43 -------------------------------------------------------------------------------- Problem List Details Patient Name: Date of Service: [name], [name]. 02/06/2021 10:00 A M Medical Record Number: [id number] Patient Account Number: [id number] Date of Birth/Sex: Treating RN: 05/12/1956 (65 y.o. 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 02/06/2021 10:00 A M Medical Record Number: [id number] Patient Account Number: [id number] Date of Birth/Sex: Treating RN: 1956/05/24 (65 y.o. [name] Primary Care Provider: [name] Other Clinician: Referring Provider: Treating Provider/Extender: [name] in Treatment: 32 Subjective Chief Complaint Information obtained from Patient patient is been followed long-term in this clinic for venous insufficiency ulcers with inflammation, hypertension and ulceration over the medial ankle bilaterally. 01/17/2020; this is a patient who is here for review of postoperative wounds on the left lateral ankle and recurrence of venous stasis ulceration on the left medial History of Present Illness (HPI) the remaining wound is over the left medial ankle. Similar wound over the  right medial ankle healed largely with use of Apligraf. Most recently we have been using Hydrofera Blue over this wound with considerable improvement. The patient has been extensively worked up in the past for her venous insufficiency and she is not a candidate for antireflux surgery although I have none of the details available currently. 08/24/14; considerable improvement today. About 50% of this wound areas now epithelialized. The base of the wound appears to be healthier granulation.as opposed to last week when she had deteriorated a considerable improvement 08/17/14; unfortunately the wound has regressed somewhat. The areas of epithelialization from the superior aspect are not nearly as healthy as they were last week. The patient thinks her Hydrofera Blue slipped. 09/07/14; unfortunately the area has markedly regressed in the 2 weeks since I've seen this. There is an odor surrounding erythema. The healthy granulation tissue that we had at the base of the wound now is a dusky color. The nurse reports green drainage 09/14/14; the area looks somewhat better than last week. There is less erythema and less drainage. The culture I did did not show any growth. Nevertheless I think it is better to continue the Cipro and doxycycline for a further week. The remaining wound area was debridement. 09/21/14. Wound did not require debridement last week. Still less erythema and less drainage. She can complete her antibiotics. The areas of epithelialization in the superior aspect of the wound do not look as healthy as they did some weeks ago 10/05/14 continued improvement in the condition of this wound. There is advancing epithelialization. Less aggressive debridement required 10/19/14 continued improvement in the condition and volume of this wound. Less aggressive debridement to the inferior part of this to remove surface slough and fibrinous eschar 11/02/14 no debridement is required. The surface granulation appears  healthy although some of her islands of epithelialization seem to have regressed. No evidence of infection 11/16/14; lites surface debridement done of surface eschar. The wound does not look to be unhealthy. No evidence of infection. Unfortunately the patient has had podiatry issues in the right foot and for some reason has redeveloped small surface ulcerations in the medial right ankle. Her original presentation involved wounds in this area 11/23/14 no debridement. The area on the right ankle has enlarged. The left ankle wound appears stable in terms  of the surface although there is periwound inflammation. There has been regression in the amount of new skin 11/30/14 no debridement. Both wound areas appear healthy. There was no evidence of infection. The the new area on the right medial ankle has enlarged although that both the surfaces appear to be stable. 12/07/14; Debridement of the right medial ankle wound. No no debridement was done on the left. 12/14/14 no major change in and now bilateral medial ankle wounds. Both of these are very painful but the no overt evidence of infection. She has had previous venous ablation 12/21/14; patient states that her right medial ankle wound is considerably more painful last week than usual. Her left is also somewhat painful. She could not tolerate debridement. The right medial ankle wound has fibrinous surface eschar 12/28/14 this is a patient with severe bilateral venous insufficiency ulcers. For a considerable period of time we actually had the one on the right medial ankle healed however this recently opened up again in June. The left medial ankle wound has been a refractory area with some absent flows. We had some success with Hydrofera Blue on this area and it literally closed by 50% however it is recently opened up Foley. Both of these were debridement today of surface eschar. She tolerates this poorly 01/25/15: No change in the status of this. Thick adherent  escar. Very poor tolerance of any attempt at debridement. I had healed the right medial malleolus wound for a considerable amount of time and had the left one down to about 50% of the volume although this is totally regressed over the last 48 weeks. Further the right leg has reopened. she is trying to make a appointment with pain and vascular, previous ablations with Dr. Aleda Grana. I do not believe there is an arterial insufficiency issue here 02/01/15 the status of the adherent eschar bilaterally is actually improved. No debridement was done. She did not manage to get vascular studies done 02/08/15 continued debridement of the area was done today. The slough is less adherent and comes off with less pressure. There is no surrounding infection peripheral pulses are intact 02/15/15 selective debridement with a disposable curette. Again the slough is less adherent and comes off with less difficulty. No surrounding infection peripheral pulses are intact. 02/22/15 selective debridement of the right medial ankle wound. Slough comes off with less difficulty. No obvious surrounding infection peripheral pulses are intact I did not debridement the one on the left. Both of these are stable to improved 03/01/15 selective debridement of both wound areas using a curette to. Adherent slough cup soft with less difficulty. No obvious surrounding infection. The patient tells me that 2 days ago she noted a rash above the right leg wrap. She did not have this on her lower legs when she change this over she arrives with widespread left greater than right almost folliculitis-looking rash which is extremely pruritic. I don't see anything to culture here. There is no rash on the rest of her body. She feels well systemically. 03/08/15; selective debridement of both wounds using a curette. Base of this does not look unhealthy. She had limegreen drainage coming out of the left leg wound and describes a lot of drainage. The rash on  her left leg looks improved to. No cultures were done. 03/22/15; patient was not here last week. Basal wounds does not look healthy and there is no surrounding erythema. No drainage. There is still a rash on the left leg that almost looks vasculitic however it  is clearly limited to the top of where the wrap would be. 04/05/15; on the right required a surgical debridement of surface eschar and necrotic subcutaneous tissue. I did not debridement the area on the left. These continue to be large open wounds that are not changing that much. We were successful at one point in healing the area on the right, and at the same time the area on the left was roughly half the size of current measurements. I think a lot of the deterioration has to do with the prolonged time the patient is on her feet at work 04/19/15 I attempted-like surface debridement bilaterally she does not tolerate this. She tells me that she was in allergic care yesterday with extreme pain over her left lateral malleolus/ankle and was told that she has an "sprain" 05/03/15; large bilateral venous insufficiency wounds over the medial malleolus/medial aspect of her ankles. She complains of copious amounts of drainage and his usual large amounts of pain. There is some increasing erythema around the wound on the right extending into the medial aspect of her foot to. historically she came in with these wounds the right one healed and the left one came down to roughly half its current size however the right one is reopened and the left is expanded. 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 05/10/15 large bilateral venous insufficiency wounds. There is less adherence surface left however the surface culture that I did last week grew pseudomonas therefore bilateral selective debridement score necessary. There is surrounding erythema. The patient describes severe bilateral drainage and a lot of pain  in the left ankle. Apparently her podiatrist was were ready to do a cortisone shot 05/17/15; the patient complains of pain and again copious amounts of drainage. 05/24/15; we used Iodo flex last week. Patient notes considerable improvement in wound drainage. Only needed to change this once. 05/31/15; we continued Iodoflex; the base of these large wounds bilaterally is not too bad but there is probably likely a significant bioburden here. I would like to debridement just doesn't tolerate it. 06/06/14 I would like to continue the Iodoflex although she still hasn't managed to obtain supplies. She has bilateral medial malleoli or large wounds which are mostly superficial. Both of them are covered circumferentially with some nonviable fibrinous slough although she tolerates debridement very poorly. She apparently has an appointment for an ablation on the right leg by interventional radiology. 06/14/15; the patient arrives with the wounds and static condition. We attempted a debridement although she does not do well with this secondary to pain. I 07/05/15; wounds are not much smaller however there appears to be a cleaner granulating base. The left has tight fibrinous slough greater than the right. Debridement is tolerated poorly due to pain. Iodoflex is done more for these wounds in any of the multitude of different dressings I have tried on the left 1 and then subsequently the right. 07/12/15; no change in the condition of this wound. I am able to do an aggressive debridement on the right but not the left. She simply cannot tolerate it. We have been using Iodoflex which helps somewhat. It is worthwhile remembering that at one point we healed the right medial ankle wound and the left was about 25% of the current circumference. We have suggested returning to vascular surgery for review of possible further ablations for one reason or another she has not been able to do this.  07/26/15 no major change in the  condition of either wound on her medial ankle. I did not attempt to debridement of these. She has been aggressively scrubbing these while she is in the shower at home. She has her supply of Iodoflex which seems to have done more for these wounds then anything I have put on recently. 08/09/15 wound area appears larger although not verified by measurements. Using Iodoflex 09/05/2015 -- she was here for avisit today but had significant problems with the wound and I was asked to see her for a physician opinion. I have summarize that this lady has had surgery on her left lower extremity about 10 years ago where the possible veins stripping was done. She has had an opinion from interventional radiology around November 2016 where no further sclerotherapy was ordered. The patient works 12 hours a day and stands on a concrete floor with work boots and is unable to get the proper compression she requires and cannot elevate her limbs appropriately at any given time. She has recently grown Pseudomonas from her wound culture but has not started her ciprofloxacin which was called in for her. 09/13/15 this continues to be a difficult situation for this patient. At one point I had this wound down to a 1.5 x 1.5" wound on her left leg. This is deteriorated and the right leg has reopened. She now has substantial wounds on her medial calcaneus, malleoli and into her lower leg. One on the left has surface eschar but these are far too painful for me to debridement here. She has a vascular surgery appointment next week to see if anything can be done to help here. I think she has had previous ablations several years ago at [facility]. She has no major edema. She tells me that she did not get product last time [facility]] and went for several days without it. She continues to work in work boots 12 hours a day. She cannot get compression/4-layer under her work boots. 09/20/15 no major change. Periwound edema control was not  very good. Her point with pain and vascular is next Wednesday the 25th 09/28/15; the patient is seen vascular surgery and is apparently scheduled for repeat duplex ultrasounds of her bilateral lower legs next week. 10/05/15; the patient was seen by Dr. Doren Custard of vascular surgery. He feels that she should have arterial insufficiency excluded as cause/contributed to her nonhealing stage she is therefore booked for an arteriogram. She has apparently monophasic signals in the dorsalis pedis pulses. She also of course has known severe chronic venous insufficiency with previous procedures as noted previously. I had another long discussion with the patient today about her continuing to work 12 hour shifts. I've written her out for 2 months area had concerns about this as her work location is currently undergoing significant turmoil and this may lead to her termination. She is aware of this however I agree with her that she simply cannot continue to stand for 12 hours multiple days a week with the substantial wound areas she has. 10/19/15; the Dr. Doren Custard appointment was largely for an arteriogram which was normal. She does not have an arterial issue. He didn't make a comment about her chronic venous insufficiency for which she has had previous ablations. Presumably it was not felt that anything additional could be done. The patient is now out of work as I prescribed 2 weeks ago. Her wounds look somewhat less aggravated presumably because of this. I felt I would give debridement another  try today 10/25/15; no major change in this patient's wounds. We are struggling to get her product that she can afford into her own home through her insurance. 11/01/15; no major change in the patient's wounds. I have been using silver alginate as the most affordable product. I spoke to Dr. Marla Roe last week with her requested take her to the OR for surgical debridement and placement of ACEL. Dr. Marla Roe told me that she would be  willing to do this however [facility] will not cover this, fortunately the patient has [place] healthcare of some variant 11/08/15; no major change in the patient's wounds. She has been completely nonviable surface that this but is in too much pain with any attempted debridement are clinic. I have arranged for her to see Dr. Marla Roe ham of plastic surgery and this appointment is on Monday. I am hopeful that they will take her to the OR for debridement, possible ACEL ultimately possible skin graft 11/22/15 no major change in the patient's wounds over her bilateral medial calcaneus medial malleolus into the lower legs. Surface on these does not look too bad however on the left there is surrounding erythema and tenderness. This may be cellulitis or could him sleepy tinea. 11/29/15; no major changes in the patient's wounds over her bilateral medial malleolus. There is no infection here and I don't think any additional antibiotics are necessary. There is now plan to move forward. She sees Dr. Marla Roe in a week's time for preparation for operative debridement and ACEL placement I believe on 7/12. She then has a follow-up appointment with Dr. Marla Roe on 7/21 12/28/15; the patient returns today having been taken to the [place] by Dr. Marla Roe 12/12/15 she underwent debridement, intraoperative cultures [which were negative]. She had placement of a wound VAC. Parent really ACEL was not available to be placed. The wound VAC foam apparently adhered to the wound since then she's been using silver alginate, Xeroform under Ace wraps. She still says there is a lot of drainage and a lot of pain 01/31/16; this is a patient I see monthly. I had referred her to Dr. Marla Roe him of plastic surgery for large wounds on her bilateral medial ankles. She has been to the OR twice once in early July and once in early August. She tells me over the last 3 weeks she has been using the wound VAC with ACEL underneath  it. On the right we've simply been using silver alginate. Under Kerlix Coban wraps. 02/28/16; this is a patient I'm currently seeing monthly. She is gone on to have a skin graft over her large venous insufficiency ulcer on the left medial ankle. This was done by Dr. Marla Roe him. The patient is a bit perturbed about why she didn't have one on her right medial ankle wound. She has been using silver alginate to this. 03/06/16; I received a phone call from her plastic surgery Dr. Marla Roe. She expressed some concern about the viability of the skin graft she did on the left medial ankle wound. Asked me to place Endoform on this. She told me she is not planning to do a subsequent skin graft on the right as the left one did not take very well. I had placed Hydrofera Blue on the right 03/13/16; continue to have a reasonably healthy wound on the right medial ankle. Down to 3 mm in terms of size. There is epithelialization here. The area on the left medial ankle is her skin graft site. I suppose the last  week this looks somewhat better. She has an open area inferiorly however in the center there appears to be some viable tissue. There is a lot of surface callus and eschar that will eventually need to come off however none of this looked to be infected. Patient states that the is able to keep the dressing on for several days which is an improvement. 03/20/16 no major change in the circumference of either wound however on the left side the patient was at Dr. Eusebio Friendly office and they did a debridement of left wound. 50% of the wound seems to be epithelialized. I been using Endoform on the left Hydrofera Blue in the right 03/27/16; she arrives today with her wound is not looking as healthy as they did last week. The area on the right clearly has an adherent surface to this a very similar surface on the left. Unfortunately for this patient this is all too familiar problem. Clearly the Endoform is not working  and will need to change that today that has some potential to help this surface. She does not tolerate debridement in this clinic very well. She is changing the dressing wants 04/03/16; patient arrives with the wounds looking somewhat better especially on the right. Dr. Migdalia Dk change the dressing to silver alginate when she saw her on Monday and also sold her some compression socks. The usefulness of the latter is really not clear and woman with severely draining wounds. 04/10/16; the patient is doing a bit of an experiment wearing the compression stockings that Dr. Migdalia Dk provided her to her left leg and the out of legs based dressings that we provided to the right. 05/01/16; the patient is continuing to wear compression stockings Dr. Migdalia Dk provided her on the left that are apparently silver impregnated. She has been using Iodoflex to the right leg wound. Still a moderate amount of drainage, when she leaves here the wraps only last for 4 days. She has to change the stocking on the left leg every night 05/15/16; she is now using compression stockings bilaterally provided by Dr. Marla Roe. She is wearing a nonadherent layer over the wounds so really I don't think there is anything specific being done to this now. She has some reduction on the left wound. The right is stable. I think all healing here is being done without a specific dressing 06/09/16; patient arrives here today with not much change in the wound certainly in diameter to large circular wounds over the medial aspect of her ankle bilaterally. Under the light of these services are certainly not viable for healing. There is no evidence of surrounding infection. She is wearing compression stockings with some sort of silver impregnation as prescribed by Dr. Marla Roe. She has a follow-up with her tomorrow. 06/30/16; no major change in the size or condition of her wounds. These are still probably covered with a nonviable surface. She is using only  her purchase stockings. She did see Dr. Marla Roe who seemed to want to apply Dakin's solution to this I'm not extreme short what value this would be. I would suggest Iodoflex which she still has at home. 07/28/16; I follow Mrs. Mayer episodically along with Dr. Marla Roe. 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 She has been applying a topical collagen ointment to both wounds with Adaptic. I don't think Dr. Marla Roe is planning to take her back to the OR. 08/19/16; I follow Mrs. Jeneen Rinks on a monthly basis along with Dr. Marla Roe  of plastic surgery. 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 I been following her for a number of years. At one point I was able to get the right medial malleolus wound to heal and had the left medial malleolus down to about half its current size however and I had to send her to plastic surgery for an operative debridement. Since then things have been stable to slightly improve the area on the right is slightly better one in the left about the same although there is much less adherent surface than I'm used to with this patient. She is using some form of liquid collagen gel that Dr. Marla Roe provided a Kerlix cover with the patient's own pressure stockings. She tells me that she has extreme pain in both ankles and along the lateral aspect of both feet. She has been unable to work for some period of time. She is telling me she is retiring at the beginning of April. She sees Dr. Doran Durand of orthopedics next week 09/22/16; patient has not seen Dr. Marla Roe since the last time she is here. I'm not really sure what she is using to the wounds other than bits and pieces of think she had left over including most recently Hydrofera Blue. She is using juxtalite stockings. She is having difficulty with her husband's recent illness "stroke". She is  having to transport him to various doctors appointments. Dr. Marla Roe left her the option of a repeat debridement with ACEL however she has not been able to get the time to follow-up on this. She continues to have a fair amount of drainage out of these wounds with certainly precludes leaving dressings on all week 10/13/16; patient has not seen Dr. Marla Roe since she was last in our clinic. I'm not really sure what she is doing with the wounds, we did try to get her [facility] and I think she is actually using this most of the time. Because of drainage she states she has to change this every second day although this is an improvement from what she used to do. She went to see Dr. Doran Durand who did not think she had a muscular issue with regards to her feet, he referred her to a neurologist and I think the appointment is sometime in June. I changed her back to Iodoflex which she has used in the past but not recently. 11/03/16; the patient has been using Iodoflex although she ran out of this. Still claims that there is a lot of drainage although the wound does not look like this. No surrounding erythema. She has not been back to see Dr. Marla Roe 11/24/16; the patient has been using Iodoflex again but she ran out of it 2 or 3 days ago. There is no major change in the condition of either one of these wounds in fact they are larger and covered in a thick adherent surface slough/nonviable tissue especially on the left. She does not tolerate mechanical debridement in our clinic. Going back to see Dr. Marla Roe of plastic surgery for an operative debridement would seem reasonable. 12/15/16; the patient has not been back to see Dr. Marla Roe. She is been dealing with a series of illnesses and her husband which of monopolized her time. She is been using Sorbact which we largely supplied. She states the drainage is bad enough that it maximum she can go 2-3 days without changing the dressing 01/12/2017 -- the  patient has not been back for about  4 weeks and has not seen Dr. Marla Roe not does she have any appointment pending. 01/23/17; patient has not seen Dr. Marla Roe even though I suggested this previously. She is using Santyl that was suggested last week by Dr. Con Memos this Cost her $16 through her insurance which is indeed surprising 02/12/17; continuing Santyl and the patient is changing this daily. A lot of drainage. She has not been back to see plastic surgery she is using an Ace wrap. Our intake nurse suggested wrap around stockings which would make a good reasonable alternative 02/26/17; patient is been using Santyl and changing this daily due to drainage. She has not been to see plastic surgery she uses in April Ace wrap to control the edema. She did obtain extremitease stockings but stated that the edema in her leg was to big for these 03/20/17; patient is using Santyl and Anasept. Surfaces looked better today the area on the right is actually measuring a little smaller. She has states she has a lot of pain in her feet and ankles and is asking for a consult to pain control which I'll try to help her with through our case manager. 04/10/17; the patient arrives with better-looking wound surfaces and is slightly smaller wound on the left she is using a combination of Santyl and Anasept. She has an appointment or at least as started in the pain control center associated with [facility] 05/14/17; this is a patient who I followed for a prolonged period of time. She has venous insufficiency ulcers on her bilateral medial ankles. At one point I had this down to a much smaller wound on the left however these reopened and we've never been able to get these to heal. She has been using Santyl and Anasept gel although 2 weeks ago she ran out of the Anasept gel. She has a stable appearance of the wound. She is going to the wound care clinic at [facility]. They wanted do a nerve block/spinal block  although she tells me she is reluctant to go forward with that. 05/21/17; this is a patient I have followed for many years. She has venous insufficiency ulcers on her bilateral medial ankles. Chronic pain and deformity in her ankles as well. She is been to see plastic surgery as well as orthopedics. Using PolyMem AG most recently/Kerramax/ABDs and 2 layer compression. She has managed to keep this on and she is coming in for a nurse check to change the dressing on Tuesdays, we see her on Fridays 06/05/17; really quite a good looking surface and the area especially on the right medial has contracted in terms of dimensions. Well granulated healthy-looking tissue on both sides. Even with an open curet there is nothing that even feels abnormal here. This is as good as I've seen this in quite some time. We have been using PolyMem AG and bringing her in for a nurse check 06/12/17; really quite good surface on both of these wounds. The right medial has contracted a bit left is not. We've been using PolyMem and AG and she is coming in for a nurse visit 06/19/17; we have been using PolyMem AG and bringing her in for a nurse check. Dimensions of her wounds are not better but the surfaces looked better bilaterally. She complained of bleeding last night and the left wound and increasing pain bilaterally. She states her wound pain is more neuropathic than just the wounds. There was some suggestion that this was radicular from her pain management doctor in talking  to her it is really difficult to sort this out. 06/26/17; using PolyMem and AG and bringing her in for a nurse check as All of this and reasonably stable condition. Certainly not improved. The dimensions on the lateral part of the right leg look better but not really measuring better. The medial aspect on the left is about the same. 07/03/16; we have been using PolyMen AG and bringing her in for a nurse check to change the dressings as the wounds have drainage  which precludes once weekly changing. We are using all secondary absorptive dressings.our intake nurse is brought up the idea of using a wound VAC/snap VAC on the wound to help with the drainage to see if this would result in some contraction. This is not a bad idea. The area on the right medial is actually looking smaller. Both wounds have a reasonable-looking surface. There is no evidence of cellulitis. The edema is well controlled 07/10/17; the patient was denied for a snap VAC by her insurance. The major issue with these wounds continues to be drainage. We are using wicked PolyMem AG and she is coming in for a nurse visit to change this. The wounds are stable to slightly improved. The surface looks vibrant and the area on the right certainly has shrunk in size but very slowly 07/17/17; the patient still has large wounds on her bilateral medial malleoli. Surface of both of these wounds looks better. The dimensions seem to come and go but no consistent improvement. There is no epithelialization. We do not have options for advanced treatment products due to insurance issues. They did not approve of the wound VAC to help control the drainage. More recently we've been using PolyMem and AG wicked to allow drainage through. We have been bringing her in for a nurse visit to change this. We do not have a lot of options for wound care products and the home again due to insurance issues 07/24/17; the patient's wound actually looks somewhat better today. No drainage measurements are smaller still healthy-looking surface. We used silver collagen under PolyMen started last week. We have been bringing her in for a dressing change 07/31/17; patient's wound surface continued to look better and I think there is visible change in the dimensions of the wound on the right. Rims of epithelialization. We have been using silver collagen under PolyMen and bringing her in for a dressing change. There appears to be less drainage  although she is still in need of the dressing change 08/07/17. Patient's wound surface continues to look better on both sides and the area on the right is definitely smaller. We have been using silver collagen and PolyMen. She feels that the drainage has been it has been better. I asked her about her vascular status. She went to see Dr. Aleda Grana at [facility] and had some form of ablation. I don't have much detail on this. I haven't my notes from 2016 that she was not a candidate for any further ablation but I don't have any more information on this. We had referred her to vein and vascular I don't think she ever went. He does not have a history of PAD although I don't have any information on this either. We don't even have ABIs in our record 08/14/17; we've been using silver collagen and PolyMen cover. And putting the patient and compression. She we are bringing her in as a nurse visit to change this because ofarge amount of drainage. We didn't the ABIs in clinic  today since they had been done in many moons 1.2 bilaterally. She has been to see vein and vascular however this was at [facility] and she had ablation although I really don't have any information on this all seemed biking get a report. She is also been operatively debrided by plastic surgery and had a cell placed probably 8-12 months ago. This didn't have a major effect. We've been making some gains with current dressings 08/19/17-She is here in follow-up evaluation for bilateral medial malleoli ulcers. She continues to tolerate debridement very poorly. We will continue with recently changed topical treatment; if no significant improvement may consider switching to Iodosorb/Iodoflex. 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 She has been using silver collagen and PolyMem. I believe she has been used and tried on Iodoflex before. During her trip to the clinic we've been watching her  wound with Anasept spray and I would like to encourage this on thenurse visit days 09/04/17 bilateral medial malleoli ulcers area is her chronic related to chronic venous insufficiency. These have been very refractory over time. We have been using silver collagen and PolyMen. She is coming in once a week for a doctor's and once a week for nurse visits. We are actually making some progress 09/18/17; the patient's wounds are smaller especially on the right medial. She arrives today to upset to consider even washing these off with Anasept which I think is been part of the reason this is been closing. We've been using collagen covered in PolyMen otherwise. It is noted that she has a small area of folliculitis on the right medial calf that. As we are wrapping her legs I'll give her a short course of doxycycline to make sure this doesn't amount to anything. She is a long list of complaints today including imbalance, shortness of breath on exertion, inversion of her left ankle. With regards to the latter complaints she is been to see orthopedics and they offered her a tendon release surgery I believe but wanted her wounds to be closed first. I have recommended she go see her primary physician with regards to everything else. 09/25/17; patient's wounds are about the same size. We have made some progress bilaterally although not in recent weeks. She will not allow me T wash these wounds with Anasept even if she is doing her cell. Wheeze we've been using collagen covered in PolyMen. Last week she had a small area of folliculitis this is now opened into a small wound. She completed 5 days of trimethoprim sulfamethoxazole 10/02/17; unfortunately the area on her left medial ankle is worse with a larger wound area towards the Achilles. The patient complains of a lot of pain. She will not allow debridement although visually I don't think there is anything to debridement in any case. We have been using silver collagen and  PolyMen for several months now. Initially we are making some progress although I'm not really seeing that today. We will move back to [facility]. His admittedly this is a bit of a repeat however I'm hoping that his situation is different now. The patient tells me she had her leg on the left give out on her yesterday this is process some pain. 10/09/17; the patient is seen twice a week largely because of drainage issues coming out of the chronic medial bimalleolar wounds that are chronic. Last week the dimensions of the one on the left looks a little larger I changed her to [facility]. She  comes in today with a history of terrible pain in the bilateral wound areas. She will not allow debridement. She will not even allow a tissue culture. There is no surrounding erythema no no evidence of cellulitis. We have been putting her Kerlix Coban man. She will not allow more aggressive compression as there was a suggestion to put her in 3 layer wraps. 10/16/17; large wounds on her bilateral medial malleoli. These are chronic. Not much change from last week. The surface looks have healthy but absolutely no epithelialization. A lot of pain little less so of drainage. She will not allow debridement or even washing these off in the vigorous fashion with Anasept. 10/23/17; large wounds on her bilateral malleoli which are chronic. Some improvement in terms of size perhaps on the right since last time I saw these. She states that after we increased the 3 layer compression there was some bleeding, when she came in for a nurse visit she did not want 3 layer compression put back on about our nurse managed to convince her. She has known chronic venous visit issues and I'm hoping to get her to tolerate the 3 layer compression. using Hydrofera Blue 10/30/17; absolutely no change in the condition of either wound although we've had some improvement in dimensions on the right.. Attempted to put her in 3 layer compression  she didn't tolerated she is back in 2 layer compression. We've been using Hydrofera Blue We looked over her past records. She had venous reflux studies in November 2016. There was no evidence of deep venous reflux on the right. Superficial vein did not show the greater saphenous vein at think this is been previously ablated the small saphenous vein was within normal limits. The left deep venous system showed no DVT the vessels were positive for deep venous reflux in the posterior tibial veins at the ankle. The greater saphenous vein was surgically absent small saphenous vein was within normal limits. She went to vein and vascular at [facility]. I believe she had an ablation on the left greater saphenous vein. I'll update her reflux studies perhaps ever reviewed by vein and vascular. We've made absolutely no progress in these wounds. Will also try to read and TheraSkins through her insurance 11/06/17; W the patient apparently has a 2 week follow-up with vein and vascular I like him to review the whole issue with regards to her previous vascular workup by Dr. Aleda Grana. We've really made no progress on these wounds in many months. She arrives today with less viable looking surface on the left medial ankle wound. This was apparently looking about the same on Tuesday when she was here for nurse visit. 11/13/17; deep tissue culture I did last time of the left lower leg showed multiple organisms without any predominating. In particular no Staphylococcus or group A strep were isolated. We sent her for venous reflux studies. She's had a previous left greater saphenous vein stripping and I think sclerotherapy of the right greater saphenous vein. She didn't really look at the lesser saphenous vein this both wounds are on the medial aspect. She has reflux in the common femoral vein and popliteal vein and an accessory vein on the right and the common femoral vein and popliteal vein on the left. I'm going to  have her go to see vein and vascular just the look over things and see if anything else beside aggressive compression is indicated here. We have not been able to make any progress on these wounds in spite  of the fact that the surface of the wounds is never look too bad. 11/20/17; no major change in the condition of the wounds. Patient reports a large amount of drainage. She has a lot of complaints of pain although enlisting her today I wonder if some of this at least his neuropathic rather than secondary to her wounds. She has an appointment with vein and vascular on 12/30/17. The refractory nature of these wounds in my mind at least need vein and vascular to look over the wounds the recent reflux studies we did and her history to see if anything further can be done here. I also note her gait is deteriorated quite a bit. 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 She has a bilateral Trendelenburg gait. I wonder if this is neuropathic or perhaps multilevel radicular. 11/27/17; her wounds actually looks slightly better. Healthy-looking granulation tissue a scant amount of epithelialization. [place] [facility] will not pay for [employer]. They will play for tri layer Oasis and Dermagraft. This is not a diabetic ulcer. We'll try for the tri layer Oasis. She still complains of some drainage. She has a vein and vascular appointment on 12/30/17 12/04/17; the wounds visually look quite good. Healthy-looking granulation with some degree of epithelialization. We are still waiting for response to our request for trial to try layer Oasis. Her appointment with vascular to review venous and arterial issues isn't sold the end of July 7/31. Not allow debridement or even vigorous cleansing of the wound surface. 12/18/17; slightly smaller especially on the right. 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 We've been using Hydrofera Blue. We still are looking into triple layer Oasis  through her insurance 01/08/18 on evaluation today patient's wound actually appears to be showing signs of good improvement at this point in time. She has been tolerating the dressing changes without complication. Fortunately there does not appear to be any evidence of infection at this point in time. We have been utilizing silver nitrate which does seem to be of benefit for her which is also good news. Overall I'm very happy with how things seem to be both regards appearance as well as measurement. Patient did see Dr. Bridgett Larsson for evaluation on 12/30/17. In his assessment he felt that stripping would not likely add much more than chronic compression to the patient's healing process. His recommendation was to follow-up in three months with Dr. Doren Custard if she hasn't healed in order to consider referral back to you and see vascular where she previously was in a trial and was able to get her wound to heal. I'll be see what she feels she when you staying compression and he reiterated this as well. 01/13/18 on evaluation today patient appears to actually be doing very well in regard to her bilateral medial malleolus ulcers. She seems to have tolerated the chemical cauterization with silver nitrate last week she did have some pain through that evening but fortunately states that I'll be see since it seems to be doing better she is overall pleased with the progress. 01/21/18; really quite a remarkable improvement since I've last seen these wounds. We started using silver nitrate specially on the islands of hyper granulation which for some reason her around the wound circumference. This is really done quite nicely. Primary dressing Hydrofera Blue under 4 layer compression. She seems to be able to hold out without a nurse rewrap. Follow-up in 1 week 01/28/18; we've continued the hydrofera blue but continued with chemical cauterization to  the wound area that we started about a month ago for irregular hyper granulation.  She is made almost stunning improvement in the overall wound dimensions. I was not really expecting this degree of improvement in these chronic wounds 02/05/18; we continue with Hydrofera Bluebut of also continued the aggressive chemical cauterization with silver nitrate. We made nice progress with the right greater than left wound. 02/12/18. We continued with Hydrofera Blue after aggressive chemical cauterization with silver nitrate. We appear to be making nice progress with both wound areas 02/19/2018; we continue with [facility] after washing the wounds vigorously with Anasept spray and chemical cauterization with silver nitrate. We are making excellent progress. The area on the right's just about closed 02/26/2018. The area on the left medial ankle had too much necrotic debris today. I used a #5 curette we are able to get most of the soft. I continued with the silver nitrate to the much smaller wound on the right medial ankle she had a new area on her right lower pretibial area which she says was due to a role in her compression 03/05/2018; both wound areas look healthy. Not much change in dimensions from last week. I continue to use silver nitrate and Hydrofera Blue. The patient saw Dr. Doren Custard of vein and vascular. He felt she had venous stasis ulcers. He felt based on her previous arteriogram she should have adequate circulation for healing. Also she has deep venous reflux but really no significant correctable superficial venous reflux at this time. He felt we should continue with conservative management including leg elevation and compression 04/02/2018; since we last saw this woman about a month ago she had a fall apparently suffered a pelvic fracture. I did not look up the x-ray. Nevertheless because of pain she literally was bedbound for 2 weeks and had home health coming out to change the dressing. Somewhat predictably this is resulted in considerable improvement in both wound areas. The  right is just about closed on the medial malleolus and the left is about half the size. 04/16/2018; both her wounds continue to go down in size. Using Hydrofera Blue. 05/07/18; both her wounds appeared to be improving especially on the right where it is almost closed. We are using Hydrofera Blue 05/14/2018; slightly worse this week with larger wounds. Surface on the left medial not quite as good. We have been using Hydrofera Blue 05/21/18; again the wounds are slightly larger. Left medial malleolus slightly larger with eschar around the circumference. We have been using Hydrofera Blue undergoing a wraps for a prolonged period of time. This got a lot better when she was more recumbent due to a fall and a back injury. I change the primary dressing the silver alginate today. She did not tolerate a 4 layer compression previously although I may need to bring this up with her next time 05/28/2018; area on the left medial malleolus again is slightly larger with more drainage. Area on the right is roughly unchanged. She has a small area of folliculitis on the right medial just on the lower calf. This does not look ominous. 06/03/2018 left medial malleolus slightly smaller in a better looking surface. We used silver nitrate on this last time with silver alginate. The area on the right appears slightly smaller 1/10; left medial malleolus slightly smaller. Small open area on the right. We used silver nitrate and silver alginate as of 2 weeks ago. We continue with the wound and compression. These got a lot better when she was  off her feet 1/17; right medial malleolus wound is smaller. The left may be slightly smaller. Both surfaces look somewhat better. 1/24; both wounds are slightly smaller. Using silver alginate under Unna boots 1/31; both wounds appear smaller in fact the area on the right medial is just about closed. Surface eschar. We have been using silver alginate under Unna boots. The patient is less active  now spends let much less time on her feet and I think this is contributed to the general improvement in the wound condition 2/7; both wounds appear smaller. I was hopeful the right medial would be closed however there there is still the same small open area. Slight amount of surface eschar on the left the dimensions are smaller there is eschar but the wound edges appear to be free. We have been using silver alginate under Unna boot's 2/14; both wounds once again measure smaller. Circumferential eschar on the left medial. We have been using silver alginate under Unna boots with gradual improvement 2/21; the area on the right medial malleolus has healed. The area on the left is smaller. We have been using silver alginate and Unna boots. We can discharge wrapping the right leg she has 20/30 stockings at home she will need to protect the scar tissue in this area 2/28; the area on the right medial malleolus remains closed the patient has a compression stocking. The area on the left is smaller. We have been using silver alginate and Unna boots. 3/6 the area on the right medial ankle remains closed. Good edema control noted she is using her own compression stocking. The area on the left medial ankle is smaller. We have been managing this with silver alginate and Unna boots which we will continue today. 3/13; the area on the right medial ankle remains closed and I'm declaring it healed today. When necessary the left is about the same still a healthy-looking surface but no major change and wound area. No evidence of infection and using silver alginate under unna and generally making considerable improvement 3/27 the area on the right medial ankle remains closed the area on the left is about the same as last week. Certainly not any worse we have been using silver alginate under an Unna boot 4/3; the area on the right medial ankle remains closed per the patient. We did not look at this wound. The wound on the  left medial ankle is about the same surface looks healthy we have been using silver alginate under an Unna boot 4/10; area on the right medial ankle remains closed per the patient. We did not look at this wound. The wound on the left medial ankle is slightly larger. The patient complains that the [facility] caused burning pain all week. She also told us that she was a lot more active this week. Changed her back to silver alginate 4/17; right medial ankle still closed per the patient. Left medial ankle is slightly larger. Using silver alginate. She did not tolerate Hydrofera Blue on this area 4/24; right medial ankle remains closed we have not look at this. The left medial ankle continues to get larger today by about a centimeter. We have been using silver alginate under Unna boots. She complains about 4 layer compression as an alternative. She has been up on her feet working on her garden 5/8; right medial ankle remains closed we did not look at this. The left medial ankle has increased in size about 100%. We have been using silver alginate under  Unna boots. She noted increased pain this week and was not surprised that the wound is deteriorated 5/15; no major change in SA however much less erythema ( one week of doxy ocellulitis). 5/22-65 year old female returns at 1 week to the clinic for left medial ankle wound for which we have been using silver alginate under 3 layer compression She was placed on DOXY at last visit - the wound is wider at this visit. She is in 3 layer compression 5/29; change to [facility] last week. I had given her empiric doxycycline 2 weeks ago for a week. She is in 3 layer compression. She complains of a lot of pain and drainage on presentation today. 6/5; using Hydrofera Blue. I gave her doxycycline recently empirically for erythema and pain around the wound. Believe her cultures showed enterococcus which not would not have been well covered by doxycycline  nevertheless the wound looks better and I don't feel specifically that the enterococcus needs to be covered. She has a new what looks like a wrap injury on her lateral left ankle. 6/12; she is using Hydrofera Blue. She has a new area on the left anterior lower tibial area. This was a wrap injury last week. 6/19; the patient is using Hydrofera Blue. She arrived with marked inflammation and erythema around the wound and tenderness. 12/01/18 on evaluation today patient appears to be doing a little bit better based on what I'm hearing from the standpoint of lassos evaluation to this as far as the overall appearance of the wound is concerned. Then sometime substandard she typically sees Dr. Dellia Nims. Nonetheless overall very pleased with the progress that she's made up to this point. No fevers, chills, nausea, or vomiting noted at this time. 7/10; some improvement in the surface area. Aggressively debrided last week apparently. I went ahead with the debridement today although the patient does not tolerate this very well. We have been using Iodoflex. Still a fair amount of drainage 7/17; slightly smaller. Using Iodoflex. 7/24; no change from last week in terms of surface area. We have been using Iodoflex. Surface looks and continues to look somewhat better 7/31; surface area slightly smaller better looking surface. We have been using Iodoflex. This is under Unna boot compression 8/7-Patient presents at 1 week with Unna boot and Iodoflex, wound appears better 8/14-Patient presents at 1 week with Iodoflex, we use the Unna boot, wound appears to be stable better.Patient is getting Botox treatment for the inversion of the foot for tendon release, Next week 8/21; we are using Iodoflex. Unna boot. The wound is stable in terms of surface area. Under illumination there is some areas of the wound that appear to be either epithelialized or perhaps this is adherent slough at this point I was not really clear. It did not  wipe off and I was reluctant to debride this today. 8/28; we are using Iodoflex in an Unna boot. Seems to be making good improvement. 9/4; using Iodoflex and wound is slightly smaller. 9/18; we are using Iodoflex with topical silver nitrate when she is here. The wound continues to be smaller 10/2; patient missed her appointment last week due to GI issues. She left and Iodoflex based dressing on for 2 weeks. Wound is about the same size about the size of a dime on the left medial lower 10/9 we have been using Iodoflex on the medial left ankle wound. She has a new superficial probable wrap injury on the dorsal left ankle 10/16; we have been using Hydrofera Blue  since last week. This is on the left medial ankle 10/23; we have been using Hydrofera Blue since 2 weeks ago. This is on the left medial ankle. Dimensions are better 11/6; using Hydrofera Blue. I think the wound is smaller but still not closed. Left medial ankle 11/13; we have been using Hydrofera Blue. Wound is certainly no smaller this week. Also the surface not as good. This is the remanent of a very large area on her left medial ankle. 11/20; using Sorbact since last week. Wound was about the same in terms of size although I was disappointed about the surface debris 12/11; 3-week follow-up. Patient was on vacation. Wound is measuring slightly larger we have been using Sorbact. 12/18; wound is about the same size however surface looks better last week after debridement. We have been using Sorbact under compression 1/15 wound is probably twice the size of last time increased in length nonviable surface. We have been using Sorbact. She was running a mild fever and missed her appointment last week 1/22; the wound is come down in size but under illumination still a very adherent debris we have been Hydrofera Blue that I changed her to last week 1/29; dimensions down slightly. We have been using Hydrofera Blue 2/19 dimensions are the same  however there is rims of epithelialization under illumination. Therefore more the surface area may be epithelialized 2/26; the patient's wound actually measures smaller. The wound looks healthy. We have been using Hydrofera Blue. I had some thoughts about running Apligraf then I still may do that however this looks so much better this week we will delay that for now 3/5; the wound is small but about the same as last week. We have been using Hydrofera Blue. No debridement is required today. 3/19; the wound is about the size of a dime. Healthy looking wound even under illumination. We have been using Hydrofera Blue. No mechanical debridement is necessary 3/26; not much change from last week although still looks very healthy. We have been using Hydrofera Blue under Unna boots Patient was offered an ankle fusion by podiatry but not until the wound heals with a proceed with this. 4/9; the patient comes in today with her original wound on the medial ankle looking satisfactory however she has some uncontrolled swelling in the middle part of her leg with 2 new open areas superiorly just lateral to the tibia. I think this was probably a wrap issue. She said she felt uncomfortable during the week but did not call in. We have been using Hydrofera Blue 4/16; the wound on the medial ankle is about the same. She has innumerable small areas superior to this across her mid tibia. I think this is probably folliculitis. She is also been working in the yard doing a lot of sweating 4/30; the patient issue on the upper areas across her mid tibia of all healed. I think this was excessive yard work if I remember. Her wound on the medial ankle is smaller. Some debris on this we have been using Hydrofera Blue under Unna boots 5/7; mid tibia. She has been using Hydrofera Blue under an Unna wrap. She is apparently going for her ankle surgery on June 3 10/28/19-Patient returns to clinic with the ankle wound, we are using  Hydrofera Blue under Unna wrap, surgery is scheduled for her left foot for June 3 so she will be back for nurse visit next week READMISSION 01/17/2020 [name] is a 65 year old woman we have had in this clinic  for a long period of time with severe venous hypertension and refractory wounds on her medial lower legs and ankles bilaterally. 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 When she was off her legs for a prolonged period example when she fell and suffered a compression fracture things would heal up quite nicely. She is now retired and we managed to heal up the right medial leg wound. The left one was very tiny last time I saw this although still refractory. 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 It got to the point where this was interfering with her walking and she elected to proceed with a ankle arthrodesis to straighten her her ankle and leave her with a functional outcome for mobilization. The patient was referred to Dr. Doren Custard and really this took some time to arrange. Dr. Doren Custard saw her on 12/07/2019. Once again he verified that she had no arterial issues. She had previously had an angiogram several years ago. Follow-up ABIs on the left showed an ABI of 1.12 with triphasic waveforms and a TBI of 0.92. She is felt to have chronic deep venous insufficiency but I do not think it was felt that anything could be done from about this from an ablation point of view. At the time Dr. Doren Custard saw this patient the wounds actually look closed via the pictures in his clinic. The patient finally underwent her surgery on 12/15/2019. This went reasonably well and there was a good anatomic outcome. She developed a small distal wound dehiscence on the lateral part of the surgical wound. However  more problematically she is developed recurrence of the wound on the medial left ankle. There are actually 2 wounds here one in the distal lower leg and 1 pretty much at the level of the medial malleolus. It is a more distal area that is more problematic. She has been using Hydrofera Blue which started on Friday before this she was simply Ace wrapping. There was a culture done that showed Pseudomonas and she is on ciprofloxacin. A recent CNS on 8/11 was negative. The patient reports some pain but I generally think this is improving. She is using a cam boot completely nonweightbearing using a walker for pivot transfers and a wheelchair 8/24; not much improvement unfortunately she has a surgical wound on the lateral part in the venous insufficiency wound medially. The bottom part of the medial insufficiency wound is still necrotic there is exposed tendon here. We have been using Hydrofera Blue under compression. Her edema control is however better 8/31; patient in for follow-up of his surgical wound on the lateral part of her left leg and chronic venous insufficiency ulcers medially. We put her back in compression last week. She comes in today with a complaint of 3 or 4 days worth of increasing pain. She felt her cam walker was rubbing on the area on the back of her heel. However there is intense erythema seems more likely she has cellulitis. She had 2 cultures done when she was seeing podiatry in the postop. One of them in late July showed Pseudomonas and she received a course of ciprofloxacin the other was negative on 8/11 she is allergic to penicillin with anaphylactoid complaints of hives oral swelling via information in epic 9/9; when I saw this patient last  week she had intense anterior erythema around her wound on the right lateral heel and ankle and also into the right medial heel. Some of this was no doubt drainage and her walker boot however I was convinced she had cellulitis. I gave her  Levaquin and Bactrim she is finishing up on this now. She is following up with Dr. [name] he saw her yesterday. He is taken her out of the walking boot of course she is still nonweightbearing. Her x-ray was negative for any worrisome features such as soft tissue air etc. Things are a lot better this week. She has home health. We have been using Hydrofera Blue under an [employer] which she put back on yesterday. I did not wrap her last week 9/17; her surrounding skin looks a lot better. In fact the area on the left lateral ankle has just a scant amount of eschar. The only remaining wound is the large area on the left medial ankle. Probably about 60% of this is healthy granulation at the surface however she has a significant divot distally. This has adherent debris in it. I been using debridement and silver collagen to try and get this area to fill-in although I do not think we have made much progress this week 9/24; the patient's wound on the left medial ankle looks a lot better. The deeper divot area distally still requires debridement but this is cleaning up quite nicely we have been using silver collagen. The patient is complaining of swelling in her foot and is worried that that is contributing to the nonhealing of the ankle wound. She is also complaining of numbness in her anterior toes 10/4; left medial ankle. The small area distally still has a divot with necrotic material that I have been debriding away. This has an undermining area. She is approved for Apligraf. She saw Dr. [name] her surgeon on 10/1. I think he declared himself is satisfied with the condition of things. Still nonweightbearing till the end of the month. 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 Her surgical wound is well closed. There is no evidence of infection 10/11; the patient arrived in clinic today with the expectation that we be able to put an Apligraf on this area after debridement however  she arrives with a relatively large amount of green drainage on the dressing. The patient states that this started on Friday. She has not been systemically unwell. 10/19; culture I did last week showed both Enterococcus and Pseudomonas. I think this came in separate parts because I stopped her ciprofloxacin I gave her and prescribed her linezolid however now looking at the final culture result this is Pseudomonas which is resistant to quinolones. She has not yet picked up the linezolid apparently phone issues. We are also trying to get a topical antibiotic out of [place] in [place] they can be applied by home health. She is still having green drainage 10/16; the patient has her topical antibiotic from [facility] in [place]. This is a compounded gel with vancomycin and ciprofloxacin and gentamicin. We are applying this on the wound bed with silver alginate over the top with Unna boot wraps. She arrives in clinic today with a lot less ominous looking drainage although she is only use this topical preparation once the second time today. She sees Dr. [name] her surgeon on Friday she has home health changing the dressing 11/2; still using her compounded topical antibiotic under silver alginate. Surface is cleaning up there  is less drainage. We had an Apligraf for her today and I elected to apply it. A light coating of her antibiotic 04/25/2020 upon evaluation today patient appears to be doing well with regard to her ankle ulcer. There is a little bit of slough noted on the surface of the wound I am can have to perform sharp debridement to clear this away today. With that being said other than that fact overall I feel like she is making progress and we do see some new epithelial growth. There is also some improvement in the depth of the wound and that distal portion. There is little bit of slough there as well. 12/7; 2-week follow-up. Apligraf #3. Dimensions are smaller. Closing in  especially inferiorly. Still some surface debris. Still using the [facility] topical antibiotic but I told her that I don't think this needs to be renewed 12/21; 2-week follow-up. Apligraf #4 dimensions are smaller. Nice improvement 06/05/2020; 2-week follow-up. The patient's wound on the left medial ankle looks really excellent. Nice granulation. Advancing epithelialization no undermining no evidence of infection. We would have to reapply for another Apligraf but with the condition of this wound I did not feel strongly about it. We used Hydrofera Blue under the same degree of compression. She follows up with Dr. [name] her surgeon a week Friday 06/13/2020 upon evaluation today patient appears to be doing excellent in regard to her wound. She has been tolerating the dressing changes without complication. Fortunately there is no signs of active infection at this time. No fevers, chills, nausea, vomiting, or diarrhea. She was using Hydrofera Blue last week. 06/20/2020 06/20/2020 on evaluation today patient actually appears to be doing excellent in regard to her wound. This is measuring better and looking much better as well. She has been using the collagen that seems to be doing better for her as well even though the [facility] was and is not sticking or feeling as rough on her wound. She did see Dr. [name] on Friday he is very pleased he also stated none of the hardware has shifted. That is great news 1/27; the patient has a small clean wound all that is remaining. I agree that this is too small to really consider an Apligraf. Under illumination the surface is looking quite good. We have been using collagen although the dimensions are not any better this week 2/2; the patient has a small clean wound on the left medial ankle. Although this left of her substantial original areas. Measurements are smaller. We have been using polymen Ag under an Haematologist. 2/10; small area on the left medial ankle. This looks  clean nothing to debride however dimensions are about the same we have been using polymen I think now for 2 weeks 2/17; not much change in surface area. We have been using polymen Ag without any improvement. 3/17; 1 month follow-up. The patient has been using endoform without any improvement in fact I think this looks worse with more depth and more expansion 3/24; no improvement. Perhaps less debris on the surface. We have been using Sorbact for 1 week 4/4; wound measures larger. She has edema in her leg and her foot which she tells as her wrap came down. We have been using Unna boots. Sorbact of the wound. She has been approved for Apligraf 09/12/2020 upon evaluation today patient appears to be doing well with regard to her wound. We did get the Apligraf reapproved which is great news we have that available for application today. Fortunately  there is no signs of infection and overall the patient seems to be doing great. The wound bed is nice and clean. 4/27; patient presents for her second application of Apligraf. She states over the past week she has been on her feet more often due to being outside in her garden. She has noted more swelling to her foot as a result. She denies increased warmth, pain or erythema to the wound site. 10/10/2020 upon evaluation today patient appears to be doing well with regard to her wound which does not appear to be quite as irritated as last week from what I am hearing. With that being said unfortunately she is having issues with some erythema and warmth to touch as well as an increase size. I do believe this likely is infected. 10/17/2020 upon evaluation today patient appears to be doing excellent in regard to her wound this is significantly improved as compared to last week. Fortunately I think that the infection is much better controlled at this point. She did have evidence of both Enterococcus as well as Staphylococcus noted on culture. Enterococcus really would not  be helped significantly by the Cipro but the wound is doing so much better I am under the assumption that the Staphylococcus is probably the main organism that is causing the current infection. Nonetheless I think that she is doing excellent as far as that is concerned and I am very pleased in that regard. I would therefore recommend she continue with the Cipro. 10/31/2020 upon evaluation today patient appears to be doing well with regard to her wound. She has been tolerating the dressing changes without complication. Fortunately there is no signs of active infection and overall I am extremely pleased with where things stand today. No fevers, chills, nausea, vomiting, or diarrhea. With that being said she does have some green drainage coming from the wound and although it looks okay I am a little concerned about the possibility of a continuing infection. Specifically with Pseudomonas. For that reason I will go ahead and send in a prescription for Cipro for her to be continued. 11/14/2020 upon evaluation today patient appears to be doing very well currently in regard to her wound on her leg. She has been tolerating the dressing changes without complication. Fortunately I feel like the infection is finally under good control here. Unfortunately we do not have the Apligraf for application today although we can definitely order to have it in place for next week. That will be her fifth and final of the current series. Nonetheless I feel like her wound is really doing quite well which is great news. 11/21/2020 upon evaluation today patient appears to be doing well with regard to her wound on the medial ankle. Fortunately I think the infection is under control and I do believe we can go ahead and reapply the Apligraf today. She is in agreement with that plan. There does not appear to be any signs of active infection at this time which is great news. No fevers, chills, nausea, vomiting, or diarrhea. 12/05/2020 upon  evaluation today patient's wound bed actually showed signs of good granulation epithelization at this point. There does not appear to be any signs of infection which is great news and overall very pleased with where things stand. Overall the patient seems to be doing fairly well in my opinion with regard to her wound although I do believe she continues to build up a lot of biofilm I think she could benefit from using PuraPly at this  point. 12/12/2020 upon evaluation today patient's wound actually appears to be doing decently well today. The Unna boot has not been quite as well-tolerated so that more uncomfortable for her and even causing some pressure over the plate on the lateral portion of her foot which is 90 where the wound is. There did not appear to be any significant deep tissue injury with that there may be a minimal change in the skin noted I think that we may want to go back to the Coflex 2 layer which is a little bit easier on her skin it seems. 12/19/2020 upon evaluation today patient actually seems to be making great progress with the PuraPly currently. She in fact seems to be much better as far as the overall appearance of the wound bed is concerned I am very happy in this regard. I do not see any signs of of infection which is great news as well. No fevers, chills, nausea, vomiting, or diarrhea. 12/26/2020 upon evaluation today patient appears to actually be doing better in regard to her wound on the left medial ankle region. The surface of the wound is actually doing significantly better which is great news. There does not appear to be any signs of infection which is also great news and in general I am extremely pleased with where we stand today. 01/02/2021 upon evaluation today patient appears to be doing well with regard to her wound. In fact this is showing signs of excellent improvement and very pleased with where things stand. In fact the last 3 appointments have all shown signs of this  getting smaller which is excellent news. I have not even had to perform any debridement and today is no exception. Overall I feel like this is dramatically improved compared to previous. T oday is PuraPly application #4. [id number] upon evaluation today patient appears to be doing excellent in regard to her wound this is continue to show signs of improvement and overall I am extremely pleased with where we stand today. She is actually here for PuraPly application #5. Every time we have applied this she is noted definite improvement on measurements. 01/23/2021 upon evaluation today patient is actually making good progress in regard to her wound. This was actually on just a little bit longer this time compared to previous due to the fact that she did have to go out of town. She is actually here for PuraPly application #6. We have definitely been seeing improvements in the overall quality of the tissue on the surface of the wound which is awesome news. In general I think that the patient seems to be continuing to make great progress here. 01/30/2021 upon evaluation today patient's wound is actually doing excellent. There is really not any significant biofilm buildup which is great news and overall I am extremely pleased with where things stand today. There does not appear to be any signs of active infection. No fevers, chills, nausea, vomiting, or diarrhea. 02/06/2021 upon evaluation today patient's wound is actually showing signs of excellent improvement which is great news. We continue to see the benefit of the PuraPly this is doing a great job the wound seems not really irritated whatsoever and is showing signs of good granulation at this point. Overall I am extremely happy with what we are seeing. The patient likewise is happy to hear all of this as well. Objective Constitutional Well-nourished and well-hydrated in no acute distress. Vitals Time Taken: 10:06 AM, Height: 68 in, Temperature: 97.6 F,  Pulse: 59 bpm, Respiratory  Rate: 18 breaths/min, Blood Pressure: 120/64 mmHg. Respiratory normal breathing without difficulty. Psychiatric this patient is able to make decisions and demonstrates good insight into disease process. Alert and Oriented x 3. pleasant and cooperative. General Notes: Upon inspection patient's wound bed showed signs of good granulation epithelization at this point. Fortunately there does not appear to be any evidence of active infection which is great news and overall I am extremely pleased with where we stand today. I did perform a light debridement in preparation for reapplying the PuraPly she tolerated that today without complication postdebridement wound bed appears to be doing significantly better. Integumentary (Hair, Skin) Wound #15 status is Open. Original cause of wound was Gradually Appeared. The date acquired was: 12/30/2019. The wound has been in treatment 55 weeks. The wound is located on the Left,Medial Malleolus. The wound measures 2.7cm length x 1.6cm width x 0.1cm depth; 3.393cm^2 area and 0.339cm^3 volume. There is Fat Layer (Subcutaneous Tissue) exposed. There is no tunneling or undermining noted. There is a medium amount of serosanguineous drainage noted. The wound margin is distinct with the outline attached to the wound base. There is medium (34-66%) red granulation within the wound bed. There is a medium (34-66%) amount of necrotic tissue within the wound bed including Adherent Slough. 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 There  was a Excisional Skin/Subcutaneous Tissue Debridement with a total area of 4.32 sq cm performed by [name], PA. With the following instrument(s): Curette to remove Viable and Non-Viable tissue/material. Material removed includes Subcutaneous Tissue and Slough and after achieving pain control using Lidocaine 5% topical ointment. No specimens were taken. A time out was conducted at 10:40, prior to the start of the procedure. A Minimum amount of bleeding was controlled with Pressure. The procedure was tolerated well with a pain level of 4 throughout and a pain level of 2 following the procedure. Post Debridement Measurements: 2.7cm length x 1.6cm width x 0.1cm depth; 0.339cm^3 volume. Character of Wound/Ulcer Post Debridement is improved. Severity of Tissue Post Debridement is: Fat layer exposed. Post procedure Diagnosis Wound #15: Same as Pre-Procedure Pre-procedure diagnosis of Wound #15 is a Venous Leg Ulcer located on the Left,Medial Malleolus. A skin graft procedure using a bioengineered skin substitute/cellular or tissue based product was performed by [name], PA with the following instrument(s): Forceps and Scissors. Puraply AM was applied and was not secured. 12 sq cm of product was utilized and 0 sq cm was wasted. Post Application, adaptic, gauze was applied. A Time Out was conducted at 10:45, prior to the start of the procedure. The procedure was tolerated well with a pain level of 0 throughout and a pain level of 0 following the procedure. Post procedure Diagnosis Wound #15: Same as Pre-Procedure . Pre-procedure diagnosis of Wound #15 is a Venous Leg Ulcer located on the Left,Medial Malleolus . There was a Double Layer Compression Therapy Procedure by Baruch Gouty, RN. Post procedure Diagnosis Wound #15: Same as Pre-Procedure Plan Follow-up Appointments: Return Appointment in 1 week. - with Margarita Grizzle Cellular or Tissue Based Products: Cellular or Tissue Based Product Type:  - PuraPly: #6, 01/30/21 PuraPly #7, 02/06/21  purPLY #8 Cellular or Tissue Based Product applied to wound bed, secured with steri-strips, cover with Adaptic or Mepitel. (DO NOT REMOVE). - no steristrips Bathing/ Shower/ Hygiene: May shower with protection but do not get wound dressing(s) wet. Edema Control - Lymphedema / SCD / Other: Elevate legs to the level of the heart or above for 30 minutes daily and/or when sitting, a frequency of: - throughout the day Avoid standing for long periods of time. Exercise regularly Compression stocking or Garment 20-30 mm/Hg pressure to: - right leg daily Additional Orders / Instructions: Follow Nutritious Diet WOUND #15: - Malleolus Wound Laterality: Left, Medial Cleanser: Soap and Water 1 x Per Week/30 Days Discharge Instructions: May shower and wash wound with dial antibacterial soap and water prior to dressing change. Peri-Wound Care: Triamcinolone 15 ([name]) 1 x Per Week/30 Days Discharge Instructions: Use triamcinolone 15 ([name]) as directed Peri-Wound Care: Sween Lotion (Moisturizing lotion) 1 x Per Week/30 Days Discharge Instructions: Apply moisturizing lotion as directed Prim Dressing: PuraPly AM 1 x Per Week/30 Days ary Secondary Dressing: Woven Gauze Sponge, Non-Sterile 4x4 in 1 x Per Week/30 Days Discharge Instructions: to bolster skin sub Secondary Dressing: ABD Pad, 5x9 1 x Per Week/30 Days Discharge Instructions: Apply over primary dressing and lateral ankle Secondary Dressing: Optifoam Non-Adhesive Dressing, 4x4 in 1 x Per Week/30 Days Discharge Instructions: Apply over primary dressing pad lateral foot with foam donut Secondary Dressing: ADAPTIC TOUCH 3x4.25 in 1 x Per Week/30 Days Discharge Instructions: Apply over skin sub Com pression Wrap: CoFlex TLC XL 2-layer Compression System 4x7 (in/yd) 1 x Per Week/30 Days Discharge Instructions: Apply CoFlex 2-layer compression as directed. (alt for 4 layer) 1. I would recommend currently that we go  ahead and continue with wound care measures as before and the patient is in agreement with the plan. Specifically recommend using the PuraPly and I did reapply that today as well. 2. I am also can recommend that we continue with Adaptic cover and zinc around the edges of the wound this seems to be doing excellent the periwound is in great shape. 3. We will continue with the Coflex 2 layer compression wrap. We will see patient back for reevaluation in 1 week here in the clinic. If anything worsens or changes patient will contact our office for additional recommendations. Electronic Signature(s) Signed: 02/06/2021 1:17:25 PM By: [name] PA-C Entered By: [name] on 02/06/2021 13:17:25 -------------------------------------------------------------------------------- SuperBill Details Patient Name: Date of Service: [name], [name]. 02/06/2021 Medical Record Number: [id number] Patient Account Number: [id number] Date of Birth/Sex: Treating RN: 12/09/1955 (65 y.o. [name] Primary Care Provider: [name] Other Clinician: Referring Provider: Treating Provider/Extender: [name] in Treatment: 71 Diagnosis Coding ICD-10 Codes Code Description [id number] Chronic venous hypertension (idiopathic) with ulcer and inflammation of left lower extremity L97.828 Non-pressure chronic ulcer of other part of left lower leg with other specified severity L97.328 Non-pressure chronic ulcer of left ankle with other specified severity Facility Procedures CPT4 Code: [id number] Description: [id number] PuraPly AM 3X4 (12sq. cm) enter 12qty Modifier: Quantity: 12 CPT4 Code: [id number] Description: [id number] - SKIN SUB GRAFT TRNK/ARM/LEG ICD-10 Diagnosis Description L97.828 Non-pressure chronic ulcer of other part of left lower leg with other specified se Modifier: verity Quantity: 1 Physician Procedures : CPT4 Code Description Modifier [id number] - WC PHYS SKIN SUB  GRAFT TRNK/ARM/LEG ICD-10 Diagnosis Description L97.828 Non-pressure chronic ulcer of other part of left lower leg with other specified severity Quantity: 1  Electronic Signature(s) Signed: 02/06/2021 1:17:33 PM By: [name] PA-C Entered By: [name] on 02/06/2021 13:17:32

## 2021-02-07 NOTE — Progress Notes (Signed)
BLESSINGS, INGLETT ([id number]) Visit Report for 02/06/2021 Arrival Information Details Patient Name: Date of Service: [facility], [name] 02/06/2021 10:00 A M Medical Record Number: [id number] Patient Account Number: [id number] Date of Birth/Sex: Treating RN: 01/12/1956 (65 y.o. [name] [name]: [name] Other Clinician: Referring Day Deery: Treating [name]/Extender: [name] in Treatment: 32 Visit Information History Since Last Visit Added or deleted any medications: No Patient Arrived: Ambulatory Any new allergies or adverse reactions: No Arrival Time: 10:02 Had a fall or experienced change in No Accompanied By: self activities of daily living that may affect Transfer Assistance: None risk of falls: Patient Identification Verified: Yes Signs or symptoms of abuse/neglect since last visito No Secondary Verification Process Completed: Yes Hospitalized since last visit: No Patient Requires Transmission-Based Precautions: No Implantable device outside of the clinic excluding No Patient Has Alerts: Yes cellular tissue based products placed in the center Patient Alerts: L ABI =1.12, TBI = .92 since last visit: R ABI= 1.02, TBI= .58 Has Dressing in Place as Prescribed: Yes Pain Present Now: Yes Electronic Signature(s) Signed: 02/07/2021 11:10:38 AM By: [name] Entered By: [name] on 02/06/2021 10:03:22 -------------------------------------------------------------------------------- Compression Therapy Details Patient Name: Date of Service: [name], [name] 02/06/2021 10:00 A M Medical Record Number: [id number] Patient Account Number: [id number] Date of Birth/Sex: Treating RN: 08-18-1955 (65 y.o. [name] Bette Brienza: [name] Other Clinician: Referring Treniece Holsclaw: Treating Nyzaiah Kai/Extender: [name] in Treatment: 98 Compression Therapy Performed for Wound  Assessment: Wound #15 Left,Medial Malleolus Performed By: Clinician [name] Gouty, RN Compression Type: Double Layer Post Procedure Diagnosis Same as Pre-procedure Electronic Signature(s) Signed: 02/06/2021 6:09:03 PM By: [name] Gouty RN, BSN Entered By: [name] on 02/06/2021 10:43:22 -------------------------------------------------------------------------------- Lower Extremity Assessment Details Patient Name: Date of Service: [name], [name] 02/06/2021 10:00 A M Medical Record Number: [id number] Patient Account Number: [id number] Date of Birth/Sex: Treating RN: 09/15/1955 (65 y.o. [name] Primary [name] Taevon Aschoff: [name] Other Clinician: Referring Aiana Nordquist: Treating [name]/Extender: [name] in Treatment: 55 Edema Assessment Assessed: [Left: Yes] [Right: No] Edema: [Left: Ye] [Right: s] Calf Left: Right: Point of Measurement: 31 cm From Medial Instep 27 cm Ankle Left: Right: Point of Measurement: 11 cm From Medial Instep 21.5 cm Vascular Assessment Pulses: Dorsalis Pedis Palpable: [Left:Yes] Electronic Signature(s) Signed: 02/06/2021 5:29:03 PM By: [name] Entered By: [name] on 02/06/2021 10:17:06 -------------------------------------------------------------------------------- [place] Details Patient Name: Date of Service: [name], [name] 02/06/2021 10:00 A M Medical Record Number: [id number] Patient Account Number: [id number] Date of Birth/Sex: Treating RN: 01-Jan-1956 (65 y.o. [name] [name]: [name] Other Clinician: Referring Kordell Jafri: Treating Kaleb Sek/Extender: [name] in Treatment: 66 Multidisciplinary [name] Plan reviewed with physician Active Inactive Venous Leg Ulcer Nursing Diagnoses: Actual venous Insuffiency (use after diagnosis is confirmed) Knowledge deficit related to disease process and  management Goals: Patient will maintain optimal edema control Date Initiated: 06/20/2020 Target Resolution Date: 02/27/2021 Goal Status: Active Interventions: Assess peripheral edema status every visit. Compression as ordered Treatment Activities: Therapeutic compression applied : 06/20/2020 Notes: 01/30/21: Edema control ongoing, using CoFlex wrap. Wound/Skin Impairment Nursing Diagnoses: Impaired tissue integrity Knowledge deficit related to ulceration/compromised skin integrity Goals: Patient/caregiver will verbalize understanding of skin [name] regimen Date Initiated: 01/17/2020 Target Resolution Date: 02/27/2021 Goal Status: Active Ulcer/skin breakdown will have a volume reduction of  30% by week 4 Date Initiated: 01/17/2020 Date Inactivated: 03/12/2020 Target Resolution Date: 03/09/2020 Goal Status: Met Interventions: Assess patient/caregiver ability to obtain necessary supplies Assess patient/caregiver ability to perform ulcer/skin [name] regimen upon admission and as needed Assess ulceration(s) every visit Provide education on ulcer and skin [name] Notes: 01/30/21: Wound [name] regimen continues, PuraPly skin sub being used. Electronic Signature(s) Signed: 02/06/2021 6:09:03 PM By: [name] Gouty RN, BSN Entered By: [name] on 02/06/2021 10:42:35 -------------------------------------------------------------------------------- Pain Assessment Details Patient Name: Date of Service: [name], [name] 02/06/2021 10:00 A M Medical Record Number: [id number] Patient Account Number: [id number] Date of Birth/Sex: Treating RN: Nov 29, 1955 (65 y.o. [name] Onna Nodal: [name] Other Clinician: Referring Zacharius Funari: Treating Crystel Demarco/Extender: [name] in Treatment: 35 Active Problems Location of Pain Severity and Description of Pain Patient Has Paino Yes Site Locations Rate the pain. Current Pain Level: 4 Pain Management  and Medication Current Pain Management: Electronic Signature(s) Signed: 02/06/2021 6:09:03 PM By: [name] Gouty RN, BSN Signed: 02/07/2021 11:10:38 AM By: [name] Entered By: [name] on 02/06/2021 10:05:56 -------------------------------------------------------------------------------- Patient/Caregiver Education Details Patient Name: Date of Service: [name], [name] 9/7/2022andnbsp10:00 A M Medical Record Number: [id number] Patient Account Number: [id number] Date of Birth/Gender: Treating RN: 15-Jul-1955 (65 y.o. [name] Physician: [name] Other Clinician: Referring Physician: Treating Physician/Extender: [name] in Treatment: 30 Education Assessment Education Provided To: Patient Education Topics Provided Venous: Methods: Explain/Verbal Responses: Reinforcements needed, State content correctly Electronic Signature(s) Signed: 02/06/2021 6:09:03 PM By: [name] Gouty RN, BSN Entered By: [name] on 02/06/2021 10:42:54 -------------------------------------------------------------------------------- Wound Assessment Details Patient Name: Date of Service: [name], [name] 02/06/2021 10:00 A M Medical Record Number: [id number] Patient Account Number: [id number] Date of Birth/Sex: Treating RN: 12/11/1955 (65 y.o. [name], [name] Primary [name] Serigne Kubicek: [name] Other Clinician: Referring Vergil Burby: Treating Kristianne Albin/Extender: [name] in Treatment: 25 Wound Status Wound Number: 15 Primary Venous Leg Ulcer Etiology: Wound Location: Left, Medial Malleolus Wound Status: Open Wounding Event: Gradually Appeared Comorbid Peripheral Venous Disease, Osteoarthritis, Confinement Date Acquired: 12/30/2019 History: Anxiety [name] Of Treatment: 55 Clustered Wound: No Photos Wound Measurements Length: (cm) 2.7 Width: (cm) 1.6 Depth: (cm) 0.1 Area: (cm)  3.393 Volume: (cm) 0.339 % Reduction in Area: 70.9% % Reduction in Volume: 96.4% Epithelialization: Small (1-33%) Tunneling: No Undermining: No Wound Description Classification: Full Thickness With Exposed Support Structures Wound Margin: Distinct, outline attached Exudate Amount: Medium Exudate Type: Serosanguineous Exudate Color: red, brown Foul [name] After Cleansing: No Slough/Fibrino Yes Wound Bed Granulation Amount: Medium (34-66%) Exposed Structure Granulation Quality: Red Fascia Exposed: No Necrotic Amount: Medium (34-66%) Fat Layer (Subcutaneous Tissue) Exposed: Yes Necrotic Quality: Adherent Slough Tendon Exposed: No Muscle Exposed: No Joint Exposed: No Bone Exposed: No Electronic Signature(s) Signed: 02/06/2021 5:29:03 PM By: [name] Entered By: [name] on 02/06/2021 10:17:58 -------------------------------------------------------------------------------- Vitals Details Patient Name: Date of Service: [name], [name] 02/06/2021 10:00 A M Medical Record Number: [id number] Patient Account Number: [id number] Date of Birth/Sex: Treating RN: Oct 09, 1955 (65 y.o. [name] Mckennah Kretchmer: [name] Other Clinician: Referring Onofre Gains: Treating Cheryle Dark/Extender: [name] in Treatment: 22 Vital Signs Time Taken: 10:06 Temperature (F): 97.6 Height (in): 68 Pulse (bpm): 59 Respiratory Rate (breaths/min): 18 Blood Pressure (mmHg): 120/64 Reference Range: 80 - 120 mg / dl Electronic Signature(s) Signed: 02/07/2021 11:10:38 AM By:  Dawkins, Destiny Entered By: [name] on 02/06/2021 10:06:26

## 2021-02-13 ENCOUNTER — Encounter (HOSPITAL_BASED_OUTPATIENT_CLINIC_OR_DEPARTMENT_OTHER): Payer: Medicare Other | Admitting: Physician Assistant

## 2021-02-13 ENCOUNTER — Other Ambulatory Visit: Payer: Self-pay

## 2021-02-13 DIAGNOSIS — I87332 Chronic venous hypertension (idiopathic) with ulcer and inflammation of left lower extremity: Secondary | ICD-10-CM | POA: Diagnosis not present

## 2021-02-13 NOTE — Progress Notes (Addendum)
[name], [name] ([id number]) Visit Report for 02/13/2021 Chief Complaint Document Details Patient Name: Date of Service: [facility] [name], [name] 02/13/2021 10:00 A M Medical Record Number: [id number] Patient Account Number: [id number] Date of Birth/Sex: Treating RN: [birth year] (65 y.o. [name] Provider: [name] Other Clinician: Referring Provider: Treating Provider/Extender: [name] in Treatment: 48 Information Obtained from: Patient Chief Complaint patient is been followed long-term in this clinic for venous insufficiency ulcers with inflammation, hypertension and ulceration over the medial ankle bilaterally. 01/17/2020; this is a patient who is here for review of postoperative wounds on the left lateral ankle and recurrence of venous stasis ulceration on the left medial Electronic Signature(s) Signed: 02/13/2021 9:55:59 AM By: [name] PA-C Entered By: [name] on 02/13/2021 09:55:59 -------------------------------------------------------------------------------- Cellular or Tissue Based Product Details Patient Name: Date of Service: [facility] [name], [name] 02/13/2021 10:00 [place] Record Number: [id number] Patient Account Number: [id number] Date of Birth/Sex: Treating RN: [birth year] (65 y.o. [name] Provider: [name] Other Clinician: Referring Provider: Treating Provider/Extender: [name] in Treatment: 14 Cellular or Tissue Based Product Type Wound #15 Left,Medial Malleolus Applied to: Performed By: Physician [name], PA Cellular or Tissue Based Product Type: Puraply AM Level of Consciousness (Pre-procedure): Awake and Alert Pre-procedure Verification/Time Out Yes - 11:11 Taken: Location: trunk / arms / legs Wound Size (sq cm): 3.04 Product Size (sq cm): 12 Waste Size (sq cm): 0 Amount of Product Applied (sq cm): 12 Instrument Used:  Forceps, Scissors Lot #: [id number] Order #: [id number] Expiration Date: 04/23/2023 Fenestrated: No Reconstituted: Yes Solution Type: saline Solution Amount: 3 ml Lot #: [id number] Solution Expiration Date: 01/31/2022 Secured: No Dressing Applied: Yes Primary Dressing: adaptic Procedural Pain: 0 Post Procedural Pain: 0 Response to Treatment: Procedure was tolerated well Level of Consciousness (Post- Awake and Alert procedure): Post Procedure Diagnosis Same as Pre-procedure Electronic Signature(s) Signed: 02/13/2021 5:28:57 PM By: [name] PA-C Signed: 02/13/2021 6:35:12 PM By: Baruch Gouty RN, BSN Entered By: Baruch Gouty on 02/13/2021 11:14:28 -------------------------------------------------------------------------------- Debridement Details Patient Name: Date of Service: [name] [name], [name] 02/13/2021 10:00 A M Medical Record Number: [id number] Patient Account Number: [id number] Date of Birth/Sex: Treating RN: 12/27/1955 (65 y.o. [name] Provider: [name] Other Clinician: Referring Provider: Treating Provider/Extender: [name] in Treatment: 51 Debridement Performed for Assessment: Wound #15 Left,Medial Malleolus Performed By: Physician [name], PA Debridement Type: Debridement Severity of Tissue Pre Debridement: Fat layer exposed Level of Consciousness (Pre-procedure): Awake and Alert Pre-procedure Verification/Time Out Yes - 11:00 Taken: Start Time: 11:01 Pain Control: Other : benzocaine 20% spray T Area Debrided (L x W): otal 1.9 (cm) x 1.6 (cm) = 3.04 (cm) Tissue and other material debrided: Viable, Non-Viable, Slough, Subcutaneous, Slough Level: Skin/Subcutaneous Tissue Debridement Description: Excisional Instrument: Curette Bleeding: Minimum Hemostasis Achieved: Pressure End Time: 11:06 Procedural Pain: 4 Post Procedural Pain: 3 Response to Treatment: Procedure was tolerated  well Level of Consciousness (Post- Awake and Alert procedure): Post Debridement Measurements of Total Wound Length: (cm) 1.9 Width: (cm) 1.6 Depth: (cm) 0.1 Volume: (cm) 0.239 Character of Wound/Ulcer Post Debridement: Improved Severity of Tissue Post Debridement: Fat layer exposed Post Procedure Diagnosis Same as Pre-procedure Electronic Signature(s) Signed: 02/13/2021 5:28:57 PM By: [name] PA-C Signed: 02/13/2021 6:35:12 PM By: Baruch Gouty RN, BSN Entered By: Baruch Gouty on  02/13/2021 11:04:53 -------------------------------------------------------------------------------- HPI Details Patient Name: Date of Service: [facility] [name], [name] 02/13/2021 10:00 A M Medical Record Number: [id number] Patient Account Number: [id number] Date of Birth/Sex: Treating RN: March 24, 1956 (65 y.o. [name] Provider: [name] Other Clinician: Referring Provider: Treating Provider/Extender: [name] in Treatment: 58 History of Present Illness HPI Description: the remaining wound is over the left medial ankle. Similar wound over the right medial ankle healed largely with use of Apligraf. Most recently we have been using Hydrofera Blue over this wound with considerable improvement. The patient has been extensively worked up in the past for her venous insufficiency and she is not a candidate for antireflux surgery although I have none of the details available currently. 08/24/14; considerable improvement today. About 50% of this wound areas now epithelialized. The base of the wound appears to be healthier granulation.as opposed to last week when she had deteriorated a considerable improvement 08/17/14; unfortunately the wound has regressed somewhat. The areas of epithelialization from the superior aspect are not nearly as healthy as they were last week. The patient thinks her Hydrofera Blue slipped. 09/07/14; unfortunately the area has  markedly regressed in the 2 weeks since I've seen this. There is an odor surrounding erythema. The healthy granulation tissue that we had at the base of the wound now is a dusky color. The nurse reports green drainage 09/14/14; the area looks somewhat better than last week. There is less erythema and less drainage. The culture I did did not show any growth. Nevertheless I think it is better to continue the Cipro and doxycycline for a further week. The remaining wound area was debridement. 09/21/14. Wound did not require debridement last week. Still less erythema and less drainage. She can complete her antibiotics. The areas of epithelialization in the superior aspect of the wound do not look as healthy as they did some weeks ago 10/05/14 continued improvement in the condition of this wound. There is advancing epithelialization. Less aggressive debridement required 10/19/14 continued improvement in the condition and volume of this wound. Less aggressive debridement to the inferior part of this to remove surface slough and fibrinous eschar 11/02/14 no debridement is required. The surface granulation appears healthy although some of her islands of epithelialization seem to have regressed. No evidence of infection 11/16/14; lites surface debridement done of surface eschar. The wound does not look to be unhealthy. No evidence of infection. Unfortunately the patient has had podiatry issues in the right foot and for some reason has redeveloped small surface ulcerations in the medial right ankle. Her original presentation involved wounds in this area 11/23/14 no debridement. The area on the right ankle has enlarged. The left ankle wound appears stable in terms of the surface although there is periwound inflammation. There has been regression in the amount of new skin 11/30/14 no debridement. Both wound areas appear healthy. There was no evidence of infection. The the new area on the right medial ankle has  enlarged although that both the surfaces appear to be stable. 12/07/14; Debridement of the right medial ankle wound. No no debridement was done on the left. 12/14/14 no major change in and now bilateral medial ankle wounds. Both of these are very painful but the no overt evidence of infection. She has had previous venous ablation 12/21/14; patient states that her right medial ankle wound is considerably more painful last week than usual. Her left is also somewhat painful. She could not tolerate debridement. The  right medial ankle wound has fibrinous surface eschar 12/28/14 this is a patient with severe bilateral venous insufficiency ulcers. For a considerable period of time we actually had the one on the right medial ankle healed however this recently opened up again in June. The left medial ankle wound has been a refractory area with some absent flows. We had some success with Hydrofera Blue on this area and it literally closed by 50% however it is recently opened up Foley. Both of these were debridement today of surface eschar. She tolerates this poorly 01/25/15: No change in the status of this. Thick adherent escar. Very poor tolerance of any attempt at debridement. I had healed the right medial malleolus wound for a considerable amount of time and had the left one down to about 50% of the volume although this is totally regressed over the last 48 weeks. Further the right leg has reopened. she is trying to make a appointment with pain and vascular, previous ablations with Dr. Aleda Grana. I do not believe there is an arterial insufficiency issue here 02/01/15 the status of the adherent eschar bilaterally is actually improved. No debridement was done. She did not manage to get vascular studies done 02/08/15 continued debridement of the area was done today. The slough is less adherent and comes off with less pressure. There is no surrounding infection peripheral pulses are intact 02/15/15 selective  debridement with a disposable curette. Again the slough is less adherent and comes off with less difficulty. No surrounding infection peripheral pulses are intact. 02/22/15 selective debridement of the right medial ankle wound. Slough comes off with less difficulty. No obvious surrounding infection peripheral pulses are intact I did not debridement the one on the left. Both of these are stable to improved 03/01/15 selective debridement of both wound areas using a curette to. Adherent slough cup soft with less difficulty. No obvious surrounding infection. The patient tells me that 2 days ago she noted a rash above the right leg wrap. She did not have this on her lower legs when she change this over she arrives with widespread left greater than right almost folliculitis-looking rash which is extremely pruritic. I don't see anything to culture here. There is no rash on the rest of her body. She feels well systemically. 03/08/15; selective debridement of both wounds using a curette. Base of this does not look unhealthy. She had limegreen drainage coming out of the left leg wound and describes a lot of drainage. The rash on her left leg looks improved to. No cultures were done. 03/22/15; patient was not here last week. Basal wounds does not look healthy and there is no surrounding erythema. No drainage. There is still a rash on the left leg that almost looks vasculitic however it is clearly limited to the top of where the wrap would be. 04/05/15; on the right required a surgical debridement of surface eschar and necrotic subcutaneous tissue. I did not debridement the area on the left. These continue to be large open wounds that are not changing that much. We were successful at one point in healing the area on the right, and at the same time the area on the left was roughly half the size of current measurements. I think a lot of the deterioration has to do with the prolonged time the patient is on her feet at  work 04/19/15 I attempted-like surface debridement bilaterally she does not tolerate this. She tells me that she was in allergic care yesterday with extreme pain over  her left lateral malleolus/ankle and was told that she has an "sprain" 05/03/15; large bilateral venous insufficiency wounds over the medial malleolus/medial aspect of her ankles. She complains of copious amounts of drainage and his usual large amounts of pain. There is some increasing erythema around the wound on the right extending into the medial aspect of her foot to. historically she came in with these wounds the right one healed and the left one came down to roughly half its current size however the right one is reopened and the left is expanded. 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 05/10/15 large bilateral venous insufficiency wounds. There is less adherence surface left however the surface culture that I did last week grew pseudomonas therefore bilateral selective debridement score necessary. There is surrounding erythema. The patient describes severe bilateral drainage and a lot of pain in the left ankle. Apparently her podiatrist was were ready to do a cortisone shot 05/17/15; the patient complains of pain and again copious amounts of drainage. 05/24/15; we used Iodo flex last week. Patient notes considerable improvement in wound drainage. Only needed to change this once. 05/31/15; we continued Iodoflex; the base of these large wounds bilaterally is not too bad but there is probably likely a significant bioburden here. I would like to debridement just doesn't tolerate it. 06/06/14 I would like to continue the Iodoflex although she still hasn't managed to obtain supplies. She has bilateral medial malleoli or large wounds which are mostly superficial. Both of them are covered circumferentially with some nonviable fibrinous slough although she tolerates debridement very poorly.  She apparently has an appointment for an ablation on the right leg by interventional radiology. 06/14/15; the patient arrives with the wounds and static condition. We attempted a debridement although she does not do well with this secondary to pain. I 07/05/15; wounds are not much smaller however there appears to be a cleaner granulating base. The left has tight fibrinous slough greater than the right. Debridement is tolerated poorly due to pain. Iodoflex is done more for these wounds in any of the multitude of different dressings I have tried on the left 1 and then subsequently the right. 07/12/15; no change in the condition of this wound. I am able to do an aggressive debridement on the right but not the left. She simply cannot tolerate it. We have been using Iodoflex which helps somewhat. It is worthwhile remembering that at one point we healed the right medial ankle wound and the left was about 25% of the current circumference. We have suggested returning to vascular surgery for review of possible further ablations for one reason or another she has not been able to do this. 07/26/15 no major change in the condition of either wound on her medial ankle. I did not attempt to debridement of these. She has been aggressively scrubbing these while she is in the shower at home. She has her supply of Iodoflex which seems to have done more for these wounds then anything I have put on recently. 08/09/15 wound area appears larger although not verified by measurements. Using Iodoflex 09/05/2015 -- she was here for avisit today but had significant problems with the wound and I was asked to see her for a physician opinion. I have summarize that this lady has had surgery on her left lower extremity about 10 years ago where the possible veins stripping was done. She has had an opinion from  interventional radiology around November 2016 where no further sclerotherapy was ordered. The patient works 12 hours a day and  stands on a concrete floor with work boots and is unable to get the proper compression she requires and cannot elevate her limbs appropriately at any given time. She has recently grown Pseudomonas from her wound culture but has not started her ciprofloxacin which was called in for her. 09/13/15 this continues to be a difficult situation for this patient. At one point I had this wound down to a 1.5 x 1.5" wound on her left leg. This is deteriorated and the right leg has reopened. She now has substantial wounds on her medial calcaneus, malleoli and into her lower leg. One on the left has surface eschar but these are far too painful for me to debridement here. She has a vascular surgery appointment next week to see if anything can be done to help here. I think she has had previous ablations several years ago at [facility]. She has no major edema. She tells me that she did not get product last time [facility]] and went for several days without it. She continues to work in work boots 12 hours a day. She cannot get compression/4-layer under her work boots. 09/20/15 no major change. Periwound edema control was not very good. Her point with pain and vascular is next Wednesday the 25th 09/28/15; the patient is seen vascular surgery and is apparently scheduled for repeat duplex ultrasounds of her bilateral lower legs next week. 10/05/15; the patient was seen by Dr. Doren Custard of vascular surgery. He feels that she should have arterial insufficiency excluded as cause/contributed to her nonhealing stage she is therefore booked for an arteriogram. She has apparently monophasic signals in the dorsalis pedis pulses. She also of course has known severe chronic venous insufficiency with previous procedures as noted previously. I had another long discussion with the patient today about her continuing to work 12 hour shifts. I've written her out for 2 months area had concerns about this as her work location is currently  undergoing significant turmoil and this may lead to her termination. She is aware of this however I agree with her that she simply cannot continue to stand for 12 hours multiple days a week with the substantial wound areas she has. 10/19/15; the Dr. Doren Custard appointment was largely for an arteriogram which was normal. She does not have an arterial issue. He didn't make a comment about her chronic venous insufficiency for which she has had previous ablations. Presumably it was not felt that anything additional could be done. The patient is now out of work as I prescribed 2 weeks ago. Her wounds look somewhat less aggravated presumably because of this. I felt I would give debridement another try today 10/25/15; no major change in this patient's wounds. We are struggling to get her product that she can afford into her own home through her insurance. 11/01/15; no major change in the patient's wounds. I have been using silver alginate as the most affordable product. I spoke to Dr. Marla Roe last week with her requested take her to the OR for surgical debridement and placement of ACEL. Dr. Marla Roe told me that she would be willing to do this however [facility] will not cover this, fortunately the patient has [place] healthcare of some variant 11/08/15; no major change in the patient's wounds. She has been completely nonviable surface that this but is in too much pain with any attempted debridement are clinic.  I have arranged for her to see Dr. Marla Roe ham of plastic surgery and this appointment is on Monday. I am hopeful that they will take her to the OR for debridement, possible ACEL ultimately possible skin graft 11/22/15 no major change in the patient's wounds over her bilateral medial calcaneus medial malleolus into the lower legs. Surface on these does not look too bad however on the left there is surrounding erythema and tenderness. This may be cellulitis or could him sleepy tinea. 11/29/15; no  major changes in the patient's wounds over her bilateral medial malleolus. There is no infection here and I don't think any additional antibiotics are necessary. There is now plan to move forward. She sees Dr. Marla Roe in a week's time for preparation for operative debridement and ACEL placement I believe on 7/12. She then has a follow-up appointment with Dr. Marla Roe on 7/21 12/28/15; the patient returns today having been taken to the [place] by Dr. Marla Roe 12/12/15 she underwent debridement, intraoperative cultures [which were negative]. She had placement of a wound VAC. Parent really ACEL was not available to be placed. The wound VAC foam apparently adhered to the wound since then she's been using silver alginate, Xeroform under Ace wraps. She still says there is a lot of drainage and a lot of pain 01/31/16; this is a patient I see monthly. I had referred her to Dr. Marla Roe him of plastic surgery for large wounds on her bilateral medial ankles. She has been to the OR twice once in early July and once in early August. She tells me over the last 3 weeks she has been using the wound VAC with ACEL underneath it. On the right we've simply been using silver alginate. Under Kerlix Coban wraps. 02/28/16; this is a patient I'm currently seeing monthly. She is gone on to have a skin graft over her large venous insufficiency ulcer on the left medial ankle. This was done by Dr. Marla Roe him. The patient is a bit perturbed about why she didn't have one on her right medial ankle wound. She has been using silver alginate to this. 03/06/16; I received a phone call from her plastic surgery Dr. Marla Roe. She expressed some concern about the viability of the skin graft she did on the left medial ankle wound. Asked me to place Endoform on this. She told me she is not planning to do a subsequent skin graft on the right as the left one did not take very well. I had placed Hydrofera Blue on the right 03/13/16;  continue to have a reasonably healthy wound on the right medial ankle. Down to 3 mm in terms of size. There is epithelialization here. The area on the left medial ankle is her skin graft site. I suppose the last week this looks somewhat better. She has an open area inferiorly however in the center there appears to be some viable tissue. There is a lot of surface callus and eschar that will eventually need to come off however none of this looked to be infected. Patient states that the is able to keep the dressing on for several days which is an improvement. 03/20/16 no major change in the circumference of either wound however on the left side the patient was at Dr. Eusebio Friendly office and they did a debridement of left wound. 50% of the wound seems to be epithelialized. I been using Endoform on the left Hydrofera Blue in the right 03/27/16; she arrives today with her wound is not looking as healthy as  they did last week. The area on the right clearly has an adherent surface to this a very similar surface on the left. Unfortunately for this patient this is all too familiar problem. Clearly the Endoform is not working and will need to change that today that has some potential to help this surface. She does not tolerate debridement in this clinic very well. She is changing the dressing wants 04/03/16; patient arrives with the wounds looking somewhat better especially on the right. Dr. Migdalia Dk change the dressing to silver alginate when she saw her on Monday and also sold her some compression socks. The usefulness of the latter is really not clear and woman with severely draining wounds. 04/10/16; the patient is doing a bit of an experiment wearing the compression stockings that Dr. Migdalia Dk provided her to her left leg and the out of legs based dressings that we provided to the right. 05/01/16; the patient is continuing to wear compression stockings Dr. Migdalia Dk provided her on the left that are apparently silver  impregnated. She has been using Iodoflex to the right leg wound. Still a moderate amount of drainage, when she leaves here the wraps only last for 4 days. She has to change the stocking on the left leg every night 05/15/16; she is now using compression stockings bilaterally provided by Dr. Marla Roe. She is wearing a nonadherent layer over the wounds so really I don't think there is anything specific being done to this now. She has some reduction on the left wound. The right is stable. I think all healing here is being done without a specific dressing 06/09/16; patient arrives here today with not much change in the wound certainly in diameter to large circular wounds over the medial aspect of her ankle bilaterally. Under the light of these services are certainly not viable for healing. There is no evidence of surrounding infection. She is wearing compression stockings with some sort of silver impregnation as prescribed by Dr. Marla Roe. She has a follow-up with her tomorrow. 06/30/16; no major change in the size or condition of her wounds. These are still probably covered with a nonviable surface. She is using only her purchase stockings. She did see Dr. Marla Roe who seemed to want to apply Dakin's solution to this I'm not extreme short what value this would be. I would suggest Iodoflex which she still has at home. 07/28/16; I follow [name] episodically along with Dr. Marla Roe. 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 She has been applying a topical collagen ointment to both wounds with Adaptic. I don't think Dr. Marla Roe is planning to take her back to the OR. 08/19/16; I follow [name] on a monthly basis along with Dr. Marla Roe of plastic surgery. 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 I been following her for a number of years. At one point I was  able to get the right medial malleolus wound to heal and had the left medial malleolus down to about half its current size however and I had to send her to plastic surgery for an operative debridement. Since then things have been stable to slightly improve the area on the right is slightly better one in the left about the same although there is much less adherent surface than I'm used to with this patient. She is using some form of liquid collagen gel that Dr. Marla Roe provided a Kerlix cover with the  patient's own pressure stockings. She tells me that she has extreme pain in both ankles and along the lateral aspect of both feet. She has been unable to work for some period of time. She is telling me she is retiring at the beginning of April. She sees Dr. Doran Durand of orthopedics next week 09/22/16; patient has not seen Dr. Marla Roe since the last time she is here. I'm not really sure what she is using to the wounds other than bits and pieces of think she had left over including most recently Hydrofera Blue. She is using juxtalite stockings. She is having difficulty with her husband's recent illness "stroke". She is having to transport him to various doctors appointments. Dr. Marla Roe left her the option of a repeat debridement with ACEL however she has not been able to get the time to follow-up on this. She continues to have a fair amount of drainage out of these wounds with certainly precludes leaving dressings on all week 10/13/16; patient has not seen Dr. Marla Roe since she was last in our clinic. I'm not really sure what she is doing with the wounds, we did try to get her [facility] and I think she is actually using this most of the time. Because of drainage she states she has to change this every second day although this is an improvement from what she used to do. She went to see Dr. Doran Durand who did not think she had a muscular issue with regards to her feet, he referred her to a neurologist  and I think the appointment is sometime in June. I changed her back to Iodoflex which she has used in the past but not recently. 11/03/16; the patient has been using Iodoflex although she ran out of this. Still claims that there is a lot of drainage although the wound does not look like this. No surrounding erythema. She has not been back to see Dr. Marla Roe 11/24/16; the patient has been using Iodoflex again but she ran out of it 2 or 3 days ago. There is no major change in the condition of either one of these wounds in fact they are larger and covered in a thick adherent surface slough/nonviable tissue especially on the left. She does not tolerate mechanical debridement in our clinic. Going back to see Dr. Marla Roe of plastic surgery for an operative debridement would seem reasonable. 12/15/16; the patient has not been back to see Dr. Marla Roe. She is been dealing with a series of illnesses and her husband which of monopolized her time. She is been using Sorbact which we largely supplied. She states the drainage is bad enough that it maximum she can go 2-3 days without changing the dressing 01/12/2017 -- the patient has not been back for about 4 weeks and has not seen Dr. Marla Roe not does she have any appointment pending. 01/23/17; patient has not seen Dr. Marla Roe even though I suggested this previously. She is using Santyl that was suggested last week by Dr. Con Memos this Cost her $16 through her insurance which is indeed surprising 02/12/17; continuing Santyl and the patient is changing this daily. A lot of drainage. She has not been back to see plastic surgery she is using an Ace wrap. Our intake nurse suggested wrap around stockings which would make a good reasonable alternative 02/26/17; patient is been using Santyl and changing this daily due to drainage. She has not been to see plastic surgery she uses in April Ace wrap to control the edema. She did obtain  extremitease stockings but stated  that the edema in her leg was to big for these 03/20/17; patient is using Santyl and Anasept. Surfaces looked better today the area on the right is actually measuring a little smaller. She has states she has a lot of pain in her feet and ankles and is asking for a consult to pain control which I'll try to help her with through our case manager. 04/10/17; the patient arrives with better-looking wound surfaces and is slightly smaller wound on the left she is using a combination of Santyl and Anasept. She has an appointment or at least as started in the pain control center associated with [facility] 05/14/17; this is a patient who I followed for a prolonged period of time. She has venous insufficiency ulcers on her bilateral medial ankles. At one point I had this down to a much smaller wound on the left however these reopened and we've never been able to get these to heal. She has been using Santyl and Anasept gel although 2 weeks ago she ran out of the Anasept gel. She has a stable appearance of the wound. She is going to the wound care clinic at [facility]. They wanted do a nerve block/spinal block although she tells me she is reluctant to go forward with that. 05/21/17; this is a patient I have followed for many years. She has venous insufficiency ulcers on her bilateral medial ankles. Chronic pain and deformity in her ankles as well. She is been to see plastic surgery as well as orthopedics. Using PolyMem AG most recently/Kerramax/ABDs and 2 layer compression. She has managed to keep this on and she is coming in for a nurse check to change the dressing on Tuesdays, we see her on Fridays 06/05/17; really quite a good looking surface and the area especially on the right medial has contracted in terms of dimensions. Well granulated healthy-looking tissue on both sides. Even with an open curet there is nothing that even feels abnormal here. This is as good as I've seen this in quite some time.  We have been using PolyMem AG and bringing her in for a nurse check 06/12/17; really quite good surface on both of these wounds. The right medial has contracted a bit left is not. We've been using PolyMem and AG and she is coming in for a nurse visit 06/19/17; we have been using PolyMem AG and bringing her in for a nurse check. Dimensions of her wounds are not better but the surfaces looked better bilaterally. She complained of bleeding last night and the left wound and increasing pain bilaterally. She states her wound pain is more neuropathic than just the wounds. There was some suggestion that this was radicular from her pain management doctor in talking to her it is really difficult to sort this out. 06/26/17; using PolyMem and AG and bringing her in for a nurse check as All of this and reasonably stable condition. Certainly not improved. The dimensions on the lateral part of the right leg look better but not really measuring better. The medial aspect on the left is about the same. 07/03/16; we have been using PolyMen AG and bringing her in for a nurse check to change the dressings as the wounds have drainage which precludes once weekly changing. We are using all secondary absorptive dressings.our intake nurse is brought up the idea of using a wound VAC/snap VAC on the wound to help with the drainage to see if this would result in some contraction.  This is not a bad idea. The area on the right medial is actually looking smaller. Both wounds have a reasonable-looking surface. There is no evidence of cellulitis. The edema is well controlled 07/10/17; the patient was denied for a snap VAC by her insurance. The major issue with these wounds continues to be drainage. We are using wicked PolyMem AG and she is coming in for a nurse visit to change this. The wounds are stable to slightly improved. The surface looks vibrant and the area on the right certainly has shrunk in size but very slowly 07/17/17; the  patient still has large wounds on her bilateral medial malleoli. Surface of both of these wounds looks better. The dimensions seem to come and go but no consistent improvement. There is no epithelialization. We do not have options for advanced treatment products due to insurance issues. They did not approve of the wound VAC to help control the drainage. More recently we've been using PolyMem and AG wicked to allow drainage through. We have been bringing her in for a nurse visit to change this. We do not have a lot of options for wound care products and the home again due to insurance issues 07/24/17; the patient's wound actually looks somewhat better today. No drainage measurements are smaller still healthy-looking surface. We used silver collagen under PolyMen started last week. We have been bringing her in for a dressing change 07/31/17; patient's wound surface continued to look better and I think there is visible change in the dimensions of the wound on the right. Rims of epithelialization. We have been using silver collagen under PolyMen and bringing her in for a dressing change. There appears to be less drainage although she is still in need of the dressing change 08/07/17. Patient's wound surface continues to look better on both sides and the area on the right is definitely smaller. We have been using silver collagen and PolyMen. She feels that the drainage has been it has been better. I asked her about her vascular status. She went to see Dr. Aleda Grana at [facility] and had some form of ablation. I don't have much detail on this. I haven't my notes from 2016 that she was not a candidate for any further ablation but I don't have any more information on this. We had referred her to vein and vascular I don't think she ever went. He does not have a history of PAD although I don't have any information on this either. We don't even have ABIs in our record 08/14/17; we've been using silver collagen and  PolyMen cover. And putting the patient and compression. She we are bringing her in as a nurse visit to change this because ofarge amount of drainage. We didn't the ABIs in clinic today since they had been done in many moons 1.2 bilaterally. She has been to see vein and vascular however this was at [facility] and she had ablation although I really don't have any information on this all seemed biking get a report. She is also been operatively debrided by plastic surgery and had a cell placed probably 8-12 months ago. This didn't have a major effect. We've been making some gains with current dressings 08/19/17-She is here in follow-up evaluation for bilateral medial malleoli ulcers. She continues to tolerate debridement very poorly. We will continue with recently changed topical treatment; if no significant improvement may consider switching to Iodosorb/Iodoflex. 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 She has been  using silver collagen and PolyMem. I believe she has been used and tried on Iodoflex before. 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 These have been very refractory over time. We have been using silver collagen and PolyMen. She is coming in once a week for a doctor's and once a week for nurse visits. We are actually making some progress 09/18/17; the patient's wounds are smaller especially on the right medial. She arrives today to upset to consider even washing these off with Anasept which I think is been part of the reason this is been closing. We've been using collagen covered in PolyMen otherwise. It is noted that she has a small area of folliculitis on the right medial calf that. As we are wrapping her legs I'll give her a short course of  doxycycline to make sure this doesn't amount to anything. She is a long list of complaints today including imbalance, shortness of breath on exertion, inversion of her left ankle. With regards to the latter complaints she is been to see orthopedics and they offered her a tendon release surgery I believe but wanted her wounds to be closed first. I have recommended she go see her primary physician with regards to everything else. 09/25/17; patient's wounds are about the same size. We have made some progress bilaterally although not in recent weeks. She will not allow me T wash these wounds with Anasept even if she is doing her cell. Wheeze we've been using collagen covered in PolyMen. Last week she had a small area of folliculitis this is now opened into a small wound. She completed 5 days of trimethoprim sulfamethoxazole 10/02/17; unfortunately the area on her left medial ankle is worse with a larger wound area towards the Achilles. The patient complains of a lot of pain. She will not allow debridement although visually I don't think there is anything to debridement in any case. We have been using silver collagen and PolyMen for several months now. Initially we are making some progress although I'm not really seeing that today. We will move back to [facility]. His admittedly this is a bit of a repeat however I'm hoping that his situation is different now. The patient tells me she had her leg on the left give out on her yesterday this is process some pain. 10/09/17; the patient is seen twice a week largely because of drainage issues coming out of the chronic medial bimalleolar wounds that are chronic. Last week the dimensions of the one on the left looks a little larger I changed her to [facility]. She comes in today with a history of terrible pain in the bilateral wound areas. She will not allow debridement. She will not even allow a tissue culture. There is no surrounding erythema no no evidence of  cellulitis. We have been putting her Kerlix Coban man. She will not allow more aggressive compression as there was a suggestion to put her in 3 layer wraps. 10/16/17; large wounds on her bilateral medial malleoli. These are chronic. Not much change from last week. The surface looks have healthy but absolutely no epithelialization. A lot of pain little less so of drainage. She will not allow debridement or even washing these off in the vigorous fashion with Anasept. 10/23/17; large wounds on her bilateral malleoli which are chronic. Some improvement  in terms of size perhaps on the right since last time I saw these. She states that after we increased the 3 layer compression there was some bleeding, when she came in for a nurse visit she did not want 3 layer compression put back on about our nurse managed to convince her. She has known chronic venous visit issues and I'm hoping to get her to tolerate the 3 layer compression. using Hydrofera Blue 10/30/17; absolutely no change in the condition of either wound although we've had some improvement in dimensions on the right.. Attempted to put her in 3 layer compression she didn't tolerated she is back in 2 layer compression. We've been using Hydrofera Blue We looked over her past records. She had venous reflux studies in November 2016. There was no evidence of deep venous reflux on the right. Superficial vein did not show the greater saphenous vein at think this is been previously ablated the small saphenous vein was within normal limits. The left deep venous system showed no DVT the vessels were positive for deep venous reflux in the posterior tibial veins at the ankle. The greater saphenous vein was surgically absent small saphenous vein was within normal limits. She went to vein and vascular at [facility]. I believe she had an ablation on the left greater saphenous vein. I'll update her reflux studies perhaps ever reviewed by vein and vascular. We've made  absolutely no progress in these wounds. Will also try to read and TheraSkins through her insurance 11/06/17; W the patient apparently has a 2 week follow-up with vein and vascular I like him to review the whole issue with regards to her previous vascular workup by Dr. Aleda Grana. We've really made no progress on these wounds in many months. She arrives today with less viable looking surface on the left medial ankle wound. This was apparently looking about the same on Tuesday when she was here for nurse visit. 11/13/17; deep tissue culture I did last time of the left lower leg showed multiple organisms without any predominating. In particular no Staphylococcus or group A strep were isolated. We sent her for venous reflux studies. She's had a previous left greater saphenous vein stripping and I think sclerotherapy of the right greater saphenous vein. She didn't really look at the lesser saphenous vein this both wounds are on the medial aspect. She has reflux in the common femoral vein and popliteal vein and an accessory vein on the right and the common femoral vein and popliteal vein on the left. I'm going to have her go to see vein and vascular just the look over things and see if anything else beside aggressive compression is indicated here. We have not been able to make any progress on these wounds in spite of the fact that the surface of the wounds is never look too bad. 11/20/17; no major change in the condition of the wounds. Patient reports a large amount of drainage. She has a lot of complaints of pain although enlisting her today I wonder if some of this at least his neuropathic rather than secondary to her wounds. She has an appointment with vein and vascular on 12/30/17. The refractory nature of these wounds in my mind at least need vein and vascular to look over the wounds the recent reflux studies we did and her history to see if anything further can be done here. I also note her gait is  deteriorated quite a bit. Looks like she has inversion of her foot on the  right. She has a bilateral Trendelenburg gait. I wonder if this is neuropathic or perhaps multilevel radicular. 11/27/17; her wounds actually looks slightly better. Healthy-looking granulation tissue a scant amount of epithelialization. [place] healthcare will not pay for [employer]. They will play for tri layer Oasis and Dermagraft. This is not a diabetic ulcer. We'll try for the tri layer Oasis. She still complains of some drainage. She has a vein and vascular appointment on 12/30/17 12/04/17; the wounds visually look quite good. Healthy-looking granulation with some degree of epithelialization. We are still waiting for response to our request for trial to try layer Oasis. Her appointment with vascular to review venous and arterial issues isn't sold the end of July 7/31. Not allow debridement or even vigorous cleansing of the wound surface. 12/18/17; slightly smaller especially on the right. 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 We've been using Hydrofera Blue. We still are looking into triple layer Oasis through her insurance 01/08/18 on evaluation today patient's wound actually appears to be showing signs of good improvement at this point in time. She has been tolerating the dressing changes without complication. Fortunately there does not appear to be any evidence of infection at this point in time. We have been utilizing silver nitrate which does seem to be of benefit for her which is also good news. Overall I'm very happy with how things seem to be both regards appearance as well as measurement. Patient did see Dr. Bridgett Larsson for evaluation on 12/30/17. In his assessment he felt that stripping would not likely add much more than chronic compression to the patient's healing process. His recommendation was to follow-up in three months with Dr. Doren Custard if she hasn't healed in order to consider referral back to  you and see vascular where she previously was in a trial and was able to get her wound to heal. I'll be see what she feels she when you staying compression and he reiterated this as well. 01/13/18 on evaluation today patient appears to actually be doing very well in regard to her bilateral medial malleolus ulcers. She seems to have tolerated the chemical cauterization with silver nitrate last week she did have some pain through that evening but fortunately states that I'll be see since it seems to be doing better she is overall pleased with the progress. 01/21/18; really quite a remarkable improvement since I've last seen these wounds. We started using silver nitrate specially on the islands of hyper granulation which for some reason her around the wound circumference. This is really done quite nicely. Primary dressing Hydrofera Blue under 4 layer compression. She seems to be able to hold out without a nurse rewrap. Follow-up in 1 week 01/28/18; we've continued the hydrofera blue but continued with chemical cauterization to the wound area that we started about a month ago for irregular hyper granulation. She is made almost stunning improvement in the overall wound dimensions. I was not really expecting this degree of improvement in these chronic wounds 02/05/18; we continue with Hydrofera Bluebut of also continued the aggressive chemical cauterization with silver nitrate. We made nice progress with the right greater than left wound. 02/12/18. We continued with Hydrofera Blue after aggressive chemical cauterization with silver nitrate. We appear to be making nice progress with both wound areas 02/19/2018; we continue with [facility] after washing the wounds vigorously with Anasept spray and chemical cauterization with silver nitrate. We are making excellent progress. The area on the right's just about closed 02/26/2018. The area on the  left medial ankle had too much necrotic debris today. I used a #5  curette we are able to get most of the soft. I continued with the silver nitrate to the much smaller wound on the right medial ankle she had a new area on her right lower pretibial area which she says was due to a role in her compression 03/05/2018; both wound areas look healthy. Not much change in dimensions from last week. I continue to use silver nitrate and Hydrofera Blue. The patient saw Dr. Doren Custard of vein and vascular. He felt she had venous stasis ulcers. He felt based on her previous arteriogram she should have adequate circulation for healing. Also she has deep venous reflux but really no significant correctable superficial venous reflux at this time. He felt we should continue with conservative management including leg elevation and compression 04/02/2018; since we last saw this woman about a month ago she had a fall apparently suffered a pelvic fracture. I did not look up the x-ray. Nevertheless because of pain she literally was bedbound for 2 weeks and had home health coming out to change the dressing. Somewhat predictably this is resulted in considerable improvement in both wound areas. The right is just about closed on the medial malleolus and the left is about half the size. 04/16/2018; both her wounds continue to go down in size. Using Hydrofera Blue. 05/07/18; both her wounds appeared to be improving especially on the right where it is almost closed. We are using Hydrofera Blue 05/14/2018; slightly worse this week with larger wounds. Surface on the left medial not quite as good. We have been using Hydrofera Blue 05/21/18; again the wounds are slightly larger. Left medial malleolus slightly larger with eschar around the circumference. We have been using Hydrofera Blue undergoing a wraps for a prolonged period of time. This got a lot better when she was more recumbent due to a fall and a back injury. I change the primary dressing the silver alginate today. She did not tolerate a 4 layer  compression previously although I may need to bring this up with her next time 05/28/2018; area on the left medial malleolus again is slightly larger with more drainage. Area on the right is roughly unchanged. She has a small area of folliculitis on the right medial just on the lower calf. This does not look ominous. 06/03/2018 left medial malleolus slightly smaller in a better looking surface. We used silver nitrate on this last time with silver alginate. The area on the right appears slightly smaller 1/10; left medial malleolus slightly smaller. Small open area on the right. We used silver nitrate and silver alginate as of 2 weeks ago. We continue with the wound and compression. These got a lot better when she was off her feet 1/17; right medial malleolus wound is smaller. The left may be slightly smaller. Both surfaces look somewhat better. 1/24; both wounds are slightly smaller. Using silver alginate under Unna boots 1/31; both wounds appear smaller in fact the area on the right medial is just about closed. Surface eschar. We have been using silver alginate under Unna boots. The patient is less active now spends let much less time on her feet and I think this is contributed to the general improvement in the wound condition 2/7; both wounds appear smaller. I was hopeful the right medial would be closed however there there is still the same small open area. Slight amount of surface eschar on the left the dimensions are smaller there is  eschar but the wound edges appear to be free. We have been using silver alginate under Unna boot's 2/14; both wounds once again measure smaller. Circumferential eschar on the left medial. We have been using silver alginate under Unna boots with gradual improvement 2/21; the area on the right medial malleolus has healed. The area on the left is smaller. We have been using silver alginate and Unna boots. We can discharge wrapping the right leg she has 20/30 stockings at  home she will need to protect the scar tissue in this area 2/28; the area on the right medial malleolus remains closed the patient has a compression stocking. The area on the left is smaller. We have been using silver alginate and Unna boots. 3/6 the area on the right medial ankle remains closed. Good edema control noted she is using her own compression stocking. The area on the left medial ankle is smaller. We have been managing this with silver alginate and Unna boots which we will continue today. 3/13; the area on the right medial ankle remains closed and I'm declaring it healed today. When necessary the left is about the same still a healthy-looking surface but no major change and wound area. No evidence of infection and using silver alginate under unna and generally making considerable improvement 3/27 the area on the right medial ankle remains closed the area on the left is about the same as last week. Certainly not any worse we have been using silver alginate under an Unna boot 4/3; the area on the right medial ankle remains closed per the patient. We did not look at this wound. The wound on the left medial ankle is about the same surface looks healthy we have been using silver alginate under an Unna boot 4/10; area on the right medial ankle remains closed per the patient. We did not look at this wound. The wound on the left medial ankle is slightly larger. The patient complains that the [facility] caused burning pain all week. She also told us that she was a lot more active this week. Changed her back to silver alginate 4/17; right medial ankle still closed per the patient. Left medial ankle is slightly larger. Using silver alginate. She did not tolerate Hydrofera Blue on this area 4/24; right medial ankle remains closed we have not look at this. The left medial ankle continues to get larger today by about a centimeter. We have been using silver alginate under Unna boots. She complains  about 4 layer compression as an alternative. She has been up on her feet working on her garden 5/8; right medial ankle remains closed we did not look at this. The left medial ankle has increased in size about 100%. We have been using silver alginate under Unna boots. She noted increased pain this week and was not surprised that the wound is deteriorated 5/15; no major change in SA however much less erythema ( one week of doxy ocellulitis). 5/22-65 year old female returns at 1 week to the clinic for left medial ankle wound for which we have been using silver alginate under 3 layer compression She was placed on DOXY at last visit - the wound is wider at this visit. She is in 3 layer compression 5/29; change to [facility] last week. I had given her empiric doxycycline 2 weeks ago for a week. She is in 3 layer compression. She complains of a lot of pain and drainage on presentation today. 6/5; using Hydrofera Blue. I gave her doxycycline recently  empirically for erythema and pain around the wound. Believe her cultures showed enterococcus which not would not have been well covered by doxycycline nevertheless the wound looks better and I don't feel specifically that the enterococcus needs to be covered. She has a new what looks like a wrap injury on her lateral left ankle. 6/12; she is using Hydrofera Blue. She has a new area on the left anterior lower tibial area. This was a wrap injury last week. 6/19; the patient is using Hydrofera Blue. She arrived with marked inflammation and erythema around the wound and tenderness. 12/01/18 on evaluation today patient appears to be doing a little bit better based on what I'm hearing from the standpoint of lassos evaluation to this as far as the overall appearance of the wound is concerned. Then sometime substandard she typically sees Dr. Dellia Nims. Nonetheless overall very pleased with the progress that she's made up to this point. No fevers, chills, nausea, or  vomiting noted at this time. 7/10; some improvement in the surface area. Aggressively debrided last week apparently. I went ahead with the debridement today although the patient does not tolerate this very well. We have been using Iodoflex. Still a fair amount of drainage 7/17; slightly smaller. Using Iodoflex. 7/24; no change from last week in terms of surface area. We have been using Iodoflex. Surface looks and continues to look somewhat better 7/31; surface area slightly smaller better looking surface. We have been using Iodoflex. This is under Unna boot compression 8/7-Patient presents at 1 week with Unna boot and Iodoflex, wound appears better 8/14-Patient presents at 1 week with Iodoflex, we use the Unna boot, wound appears to be stable better.Patient is getting Botox treatment for the inversion of the foot for tendon release, Next week 8/21; we are using Iodoflex. Unna boot. The wound is stable in terms of surface area. Under illumination there is some areas of the wound that appear to be either epithelialized or perhaps this is adherent slough at this point I was not really clear. It did not wipe off and I was reluctant to debride this today. 8/28; we are using Iodoflex in an Unna boot. Seems to be making good improvement. 9/4; using Iodoflex and wound is slightly smaller. 9/18; we are using Iodoflex with topical silver nitrate when she is here. The wound continues to be smaller 10/2; patient missed her appointment last week due to GI issues. She left and Iodoflex based dressing on for 2 weeks. Wound is about the same size about the size of a dime on the left medial lower 10/9 we have been using Iodoflex on the medial left ankle wound. She has a new superficial probable wrap injury on the dorsal left ankle 10/16; we have been using Hydrofera Blue since last week. This is on the left medial ankle 10/23; we have been using Hydrofera Blue since 2 weeks ago. This is on the left medial ankle.  Dimensions are better 11/6; using Hydrofera Blue. I think the wound is smaller but still not closed. Left medial ankle 11/13; we have been using Hydrofera Blue. Wound is certainly no smaller this week. Also the surface not as good. This is the remanent of a very large area on her left medial ankle. 11/20; using Sorbact since last week. Wound was about the same in terms of size although I was disappointed about the surface debris 12/11; 3-week follow-up. Patient was on vacation. Wound is measuring slightly larger we have been using Sorbact. 12/18; wound is about the  same size however surface looks better last week after debridement. We have been using Sorbact under compression 1/15 wound is probably twice the size of last time increased in length nonviable surface. We have been using Sorbact. She was running a mild fever and missed her appointment last week 1/22; the wound is come down in size but under illumination still a very adherent debris we have been Hydrofera Blue that I changed her to last week 1/29; dimensions down slightly. We have been using Hydrofera Blue 2/19 dimensions are the same however there is rims of epithelialization under illumination. Therefore more the surface area may be epithelialized 2/26; the patient's wound actually measures smaller. The wound looks healthy. We have been using Hydrofera Blue. I had some thoughts about running Apligraf then I still may do that however this looks so much better this week we will delay that for now 3/5; the wound is small but about the same as last week. We have been using Hydrofera Blue. No debridement is required today. 3/19; the wound is about the size of a dime. Healthy looking wound even under illumination. We have been using Hydrofera Blue. No mechanical debridement is necessary 3/26; not much change from last week although still looks very healthy. We have been using Hydrofera Blue under Unna boots Patient was offered an ankle  fusion by podiatry but not until the wound heals with a proceed with this. 4/9; the patient comes in today with her original wound on the medial ankle looking satisfactory however she has some uncontrolled swelling in the middle part of her leg with 2 new open areas superiorly just lateral to the tibia. I think this was probably a wrap issue. She said she felt uncomfortable during the week but did not call in. We have been using Hydrofera Blue 4/16; the wound on the medial ankle is about the same. She has innumerable small areas superior to this across her mid tibia. I think this is probably folliculitis. She is also been working in the yard doing a lot of sweating 4/30; the patient issue on the upper areas across her mid tibia of all healed. I think this was excessive yard work if I remember. Her wound on the medial ankle is smaller. Some debris on this we have been using Hydrofera Blue under Unna boots 5/7; mid tibia. She has been using Hydrofera Blue under an Unna wrap. She is apparently going for her ankle surgery on June 3 10/28/19-Patient returns to clinic with the ankle wound, we are using Hydrofera Blue under Unna wrap, surgery is scheduled for her left foot for June 3 so she will be back for nurse visit next week READMISSION 01/17/2020 [name] is a 65 year old woman we have had in this clinic for a long period of time with severe venous hypertension and refractory wounds on her medial lower legs and ankles bilaterally. 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 When she was off her legs for a prolonged period example when she fell and suffered a compression fracture things would heal up quite nicely. She is now retired and we managed to heal up the right medial leg wound. The left one was very tiny last time I saw this although still refractory. 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  It got to the point where this was interfering with her walking and she elected to proceed with a ankle arthrodesis to straighten her her ankle and leave her with a functional outcome for mobilization. The patient was referred to Dr. Doren Custard and really this took some time to arrange. Dr. Doren Custard saw her on 12/07/2019. Once again he verified that she had no arterial issues. She had previously had an angiogram several years ago. Follow-up ABIs on the left showed an ABI of 1.12 with triphasic waveforms and a TBI of 0.92. She is felt to have chronic deep venous insufficiency but I do not think it was felt that anything could be done from about this from an ablation point of view. At the time Dr. Doren Custard saw this patient the wounds actually look closed via the pictures in his clinic. The patient finally underwent her surgery on 12/15/2019. This went reasonably well and there was a good anatomic outcome. She developed a small distal wound dehiscence on the lateral part of the surgical wound. However more problematically she is developed recurrence of the wound on the medial left ankle. There are actually 2 wounds here one in the distal lower leg and 1 pretty much at the level of the medial malleolus. It is a more distal area that is more problematic. She has been using Hydrofera Blue which started on Friday before this she was simply Ace wrapping. There was a culture done that showed Pseudomonas and she is on ciprofloxacin. A recent CNS on 8/11 was negative. The patient reports some pain but I generally think this is improving. She is using a cam boot completely nonweightbearing using a walker for pivot transfers and a wheelchair 8/24; not much improvement unfortunately she has a surgical wound on the lateral part in the venous insufficiency wound medially. The bottom part of the medial insufficiency wound  is still necrotic there is exposed tendon here. We have been using Hydrofera Blue under compression. Her edema control is however better 8/31; patient in for follow-up of his surgical wound on the lateral part of her left leg and chronic venous insufficiency ulcers medially. We put her back in compression last week. She comes in today with a complaint of 3 or 4 days worth of increasing pain. She felt her cam walker was rubbing on the area on the back of her heel. However there is intense erythema seems more likely she has cellulitis. She had 2 cultures done when she was seeing podiatry in the postop. One of them in late July showed Pseudomonas and she received a course of ciprofloxacin the other was negative on 8/11 she is allergic to penicillin with anaphylactoid complaints of hives oral swelling via information in epic 9/9; when I saw this patient last week she had intense anterior erythema around her wound on the right lateral heel and ankle and also into the right medial heel. Some of this was no doubt drainage and her walker boot however I was convinced she had cellulitis. I gave her Levaquin and Bactrim she is finishing up on this now. She is following up with Dr. [name] he saw her yesterday. He is taken her out of the walking boot of course she is still nonweightbearing. Her x-ray was negative for any worrisome features such as soft tissue air etc. Things are a lot better this week. She has home health. We have been using Hydrofera Blue under an [employer] which she put back on yesterday. I did not wrap her last  week 9/17; her surrounding skin looks a lot better. In fact the area on the left lateral ankle has just a scant amount of eschar. The only remaining wound is the large area on the left medial ankle. Probably about 60% of this is healthy granulation at the surface however she has a significant divot distally. This has adherent debris in it. I been using debridement and silver collagen to  try and get this area to fill-in although I do not think we have made much progress this week 9/24; the patient's wound on the left medial ankle looks a lot better. The deeper divot area distally still requires debridement but this is cleaning up quite nicely we have been using silver collagen. The patient is complaining of swelling in her foot and is worried that that is contributing to the nonhealing of the ankle wound. She is also complaining of numbness in her anterior toes 10/4; left medial ankle. The small area distally still has a divot with necrotic material that I have been debriding away. This has an undermining area. She is approved for Apligraf. She saw Dr. [name] her surgeon on 10/1. I think he declared himself is satisfied with the condition of things. Still nonweightbearing till the end of the month. 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 Her surgical wound is well closed. There is no evidence of infection 10/11; the patient arrived in clinic today with the expectation that we be able to put an Apligraf on this area after debridement however she arrives with a relatively large amount of green drainage on the dressing. The patient states that this started on Friday. She has not been systemically unwell. 10/19; culture I did last week showed both Enterococcus and Pseudomonas. I think this came in separate parts because I stopped her ciprofloxacin I gave her and prescribed her linezolid however now looking at the final culture result this is Pseudomonas which is resistant to quinolones. She has not yet picked up the linezolid apparently phone issues. We are also trying to get a topical antibiotic out of [place] in [place] they can be applied by home health. She is still having green drainage 10/16; the patient has her topical antibiotic from [facility] in [place]. This is a compounded gel with vancomycin and ciprofloxacin and  gentamicin. We are applying this on the wound bed with silver alginate over the top with Unna boot wraps. She arrives in clinic today with a lot less ominous looking drainage although she is only use this topical preparation once the second time today. She sees Dr. [name] her surgeon on Friday she has home health changing the dressing 11/2; still using her compounded topical antibiotic under silver alginate. Surface is cleaning up there is less drainage. We had an Apligraf for her today and I elected to apply it. A light coating of her antibiotic 04/25/2020 upon evaluation today patient appears to be doing well with regard to her ankle ulcer. There is a little bit of slough noted on the surface of the wound I am can have to perform sharp debridement to clear this away today. With that being said other than that fact overall I feel like she is making progress and we do see some new epithelial growth. There is also some improvement in the depth of the wound and that distal portion. There is little bit of slough there as well. 12/7; 2-week follow-up. Apligraf #3. Dimensions are smaller. Closing in especially inferiorly. Still some  surface debris. Still using the [facility] topical antibiotic but I told her that I don't think this needs to be renewed 12/21; 2-week follow-up. Apligraf #4 dimensions are smaller. Nice improvement 06/05/2020; 2-week follow-up. The patient's wound on the left medial ankle looks really excellent. Nice granulation. Advancing epithelialization no undermining no evidence of infection. We would have to reapply for another Apligraf but with the condition of this wound I did not feel strongly about it. We used Hydrofera Blue under the same degree of compression. She follows up with Dr. [name] her surgeon a week Friday 06/13/2020 upon evaluation today patient appears to be doing excellent in regard to her wound. She has been tolerating the dressing changes without complication. Fortunately  there is no signs of active infection at this time. No fevers, chills, nausea, vomiting, or diarrhea. She was using Hydrofera Blue last week. 06/20/2020 06/20/2020 on evaluation today patient actually appears to be doing excellent in regard to her wound. This is measuring better and looking much better as well. She has been using the collagen that seems to be doing better for her as well even though the [facility] was and is not sticking or feeling as rough on her wound. She did see Dr. [name] on Friday he is very pleased he also stated none of the hardware has shifted. That is great news 1/27; the patient has a small clean wound all that is remaining. I agree that this is too small to really consider an Apligraf. Under illumination the surface is looking quite good. We have been using collagen although the dimensions are not any better this week 2/2; the patient has a small clean wound on the left medial ankle. Although this left of her substantial original areas. Measurements are smaller. We have been using polymen Ag under an Haematologist. 2/10; small area on the left medial ankle. This looks clean nothing to debride however dimensions are about the same we have been using polymen I think now for 2 weeks 2/17; not much change in surface area. We have been using polymen Ag without any improvement. 3/17; 1 month follow-up. The patient has been using endoform without any improvement in fact I think this looks worse with more depth and more expansion 3/24; no improvement. Perhaps less debris on the surface. We have been using Sorbact for 1 week 4/4; wound measures larger. She has edema in her leg and her foot which she tells as her wrap came down. We have been using Unna boots. Sorbact of the wound. She has been approved for Apligraf 09/12/2020 upon evaluation today patient appears to be doing well with regard to her wound. We did get the Apligraf reapproved which is great news we have that available  for application today. Fortunately there is no signs of infection and overall the patient seems to be doing great. The wound bed is nice and clean. 4/27; patient presents for her second application of Apligraf. She states over the past week she has been on her feet more often due to being outside in her garden. She has noted more swelling to her foot as a result. She denies increased warmth, pain or erythema to the wound site. 10/10/2020 upon evaluation today patient appears to be doing well with regard to her wound which does not appear to be quite as irritated as last week from what I am hearing. With that being said unfortunately she is having issues with some erythema and warmth to touch as well as an increase  size. I do believe this likely is infected. 10/17/2020 upon evaluation today patient appears to be doing excellent in regard to her wound this is significantly improved as compared to last week. Fortunately I think that the infection is much better controlled at this point. She did have evidence of both Enterococcus as well as Staphylococcus noted on culture. Enterococcus really would not be helped significantly by the Cipro but the wound is doing so much better I am under the assumption that the Staphylococcus is probably the main organism that is causing the current infection. Nonetheless I think that she is doing excellent as far as that is concerned and I am very pleased in that regard. I would therefore recommend she continue with the Cipro. 10/31/2020 upon evaluation today patient appears to be doing well with regard to her wound. She has been tolerating the dressing changes without complication. Fortunately there is no signs of active infection and overall I am extremely pleased with where things stand today. No fevers, chills, nausea, vomiting, or diarrhea. With that being said she does have some green drainage coming from the wound and although it looks okay I am a little concerned about  the possibility of a continuing infection. Specifically with Pseudomonas. For that reason I will go ahead and send in a prescription for Cipro for her to be continued. 11/14/2020 upon evaluation today patient appears to be doing very well currently in regard to her wound on her leg. She has been tolerating the dressing changes without complication. Fortunately I feel like the infection is finally under good control here. Unfortunately we do not have the Apligraf for application today although we can definitely order to have it in place for next week. That will be her fifth and final of the current series. Nonetheless I feel like her wound is really doing quite well which is great news. 11/21/2020 upon evaluation today patient appears to be doing well with regard to her wound on the medial ankle. Fortunately I think the infection is under control and I do believe we can go ahead and reapply the Apligraf today. She is in agreement with that plan. There does not appear to be any signs of active infection at this time which is great news. No fevers, chills, nausea, vomiting, or diarrhea. 12/05/2020 upon evaluation today patient's wound bed actually showed signs of good granulation epithelization at this point. There does not appear to be any signs of infection which is great news and overall very pleased with where things stand. Overall the patient seems to be doing fairly well in my opinion with regard to her wound although I do believe she continues to build up a lot of biofilm I think she could benefit from using PuraPly at this point. 12/12/2020 upon evaluation today patient's wound actually appears to be doing decently well today. The Unna boot has not been quite as well-tolerated so that more uncomfortable for her and even causing some pressure over the plate on the lateral portion of her foot which is 90 where the wound is. There did not appear to be any significant deep tissue injury with that there may  be a minimal change in the skin noted I think that we may want to go back to the Coflex 2 layer which is a little bit easier on her skin it seems. 12/19/2020 upon evaluation today patient actually seems to be making great progress with the PuraPly currently. She in fact seems to be much better as far as  the overall appearance of the wound bed is concerned I am very happy in this regard. I do not see any signs of of infection which is great news as well. No fevers, chills, nausea, vomiting, or diarrhea. 12/26/2020 upon evaluation today patient appears to actually be doing better in regard to her wound on the left medial ankle region. The surface of the wound is actually doing significantly better which is great news. There does not appear to be any signs of infection which is also great news and in general I am extremely pleased with where we stand today. 01/02/2021 upon evaluation today patient appears to be doing well with regard to her wound. In fact this is showing signs of excellent improvement and very pleased with where things stand. In fact the last 3 appointments have all shown signs of this getting smaller which is excellent news. I have not even had to perform any debridement and today is no exception. Overall I feel like this is dramatically improved compared to previous. T oday is PuraPly application #4. [id number] upon evaluation today patient appears to be doing excellent in regard to her wound this is continue to show signs of improvement and overall I am extremely pleased with where we stand today. She is actually here for PuraPly application #5. Every time we have applied this she is noted definite improvement on measurements. 01/23/2021 upon evaluation today patient is actually making good progress in regard to her wound. This was actually on just a little bit longer this time compared to previous due to the fact that she did have to go out of town. She is actually here for PuraPly  application #6. We have definitely been seeing improvements in the overall quality of the tissue on the surface of the wound which is awesome news. In general I think that the patient seems to be continuing to make great progress here. 01/30/2021 upon evaluation today patient's wound is actually doing excellent. There is really not any significant biofilm buildup which is great news and overall I am extremely pleased with where things stand today. There does not appear to be any signs of active infection. No fevers, chills, nausea, vomiting, or diarrhea. 02/06/2021 upon evaluation today patient's wound is actually showing signs of excellent improvement which is great news. We continue to see the benefit of the PuraPly this is doing a great job the wound seems not really irritated whatsoever and is showing signs of good granulation at this point. Overall I am extremely happy with what we are seeing. The patient likewise is happy to hear all of this as well. 02/13/2021 upon evaluation today patient appears to be doing well with regard to her wound. This again is measuring smaller today and I am very pleased in this regard. Fortunately there does not appear to be any signs of active infection at this time which is good news from a systemic standpoint. Locally there is still some significant drainage which she does have is concerned about infection locally. No fevers, chills, nausea, vomiting, or diarrhea. Electronic Signature(s) Signed: 02/13/2021 11:20:01 AM By: [name] PA-C Entered By: [name] on 02/13/2021 11:20:01 -------------------------------------------------------------------------------- Physical Exam Details Patient Name: Date of Service: [facility] [name], [name] 02/13/2021 10:00 A M Medical Record Number: [id number] Patient Account Number: [id number] Date of Birth/Sex: Treating RN: 1956/03/14 (65 y.o. [name] Provider: [name] Other  Clinician: Referring Provider: Treating Provider/Extender: [name] Weeks in Treatment:  59 Constitutional Well-nourished and well-hydrated in no acute distress. Respiratory normal breathing without difficulty. Psychiatric this patient is able to make decisions and demonstrates good insight into disease process. Alert and Oriented x 3. pleasant and cooperative. Notes Upon inspection patient's wound bed showed signs of good granulation and epithelization for the most part. There was some need for sharp debridement to remove some of the slough and biofilm from the surface of the wound. Patient did tolerate the dressing changes without complication and postdebridement the wound bed appears to be doing significantly better which is great news. We did do a pre and post fluorescence imaging with the MolecuLight device. The patient did show evidence of blush/red fluorescence noted. Some of this was even underneath intact skin in the 6:00 location again secondary to this I am concerned about infection count of being somewhat deep-seated I would recommend that we go ahead and see about initiating oral antibiotic therapy at this point. Electronic Signature(s) Signed: 02/13/2021 11:21:05 AM By: [name] PA-C Entered By: [name] on 02/13/2021 11:21:04 -------------------------------------------------------------------------------- Physician Orders Details Patient Name: Date of Service: [name], [name] 02/13/2021 10:00 [place] Record Number: [id number] Patient Account Number: [id number] Date of Birth/Sex: Treating RN: 12/15/1955 (65 y.o. [name] Provider: [name] Other Clinician: Referring Provider: Treating Provider/Extender: [name] in Treatment: 41 Verbal / Phone Orders: No Diagnosis Coding ICD-10 Coding Code Description [phone] Chronic venous hypertension (idiopathic) with ulcer and  inflammation of left lower extremity L97.828 Non-pressure chronic ulcer of other part of left lower leg with other specified severity L97.328 Non-pressure chronic ulcer of left ankle with other specified severity Follow-up Appointments ppointment in 1 week. - with Margarita Grizzle Return A Cellular or Tissue Based Products Cellular or Tissue Based Product Type: - PuraPly: #6, 01/30/21 PuraPly #7, 02/06/21 purPLY #8, 02/13/21 Puraply AM #9 daptic or Mepitel. (DO NOT REMOVE). - no Cellular or Tissue Based Product applied to wound bed, secured with steri-strips, cover with A steristrips Bathing/ Shower/ Hygiene May shower with protection but do not get wound dressing(s) wet. Edema Control - Lymphedema / SCD / Other Elevate legs to the level of the heart or above for 30 minutes daily and/or when sitting, a frequency of: - throughout the day Avoid standing for long periods of time. Exercise regularly Compression stocking or Garment 20-30 mm/Hg pressure to: - right leg daily Additional Orders / Instructions Follow Nutritious Diet Wound Treatment Wound #15 - Malleolus Wound Laterality: Left, Medial Cleanser: Soap and Water 1 x Per Week/30 Days Discharge Instructions: May shower and wash wound with dial antibacterial soap and water prior to dressing change. Peri-Wound Care: Triamcinolone 15 (g) 1 x Per Week/30 Days Discharge Instructions: Use triamcinolone 15 (g) as directed Peri-Wound Care: Sween Lotion (Moisturizing lotion) 1 x Per Week/30 Days Discharge Instructions: Apply moisturizing lotion as directed Prim Dressing: PuraPly AM ary 1 x Per Week/30 Days Secondary Dressing: Woven Gauze Sponge, Non-Sterile 4x4 in 1 x Per Week/30 Days Discharge Instructions: to bolster skin sub Secondary Dressing: ABD Pad, 5x9 1 x Per Week/30 Days Discharge Instructions: Apply over primary dressing and lateral ankle Secondary Dressing: Optifoam Non-Adhesive Dressing, 4x4 in 1 x Per Week/30 Days Discharge Instructions:  Apply over primary dressing pad lateral foot with foam donut Secondary Dressing: ADAPTIC TOUCH 3x4.25 in 1 x Per Week/30 Days Discharge Instructions: Apply over skin sub Compression Wrap: CoFlex TLC XL 2-layer Compression System 4x7 (in/yd) 1 x Per Week/30  Days Discharge Instructions: Apply CoFlex 2-layer compression as directed. (alt for 4 layer) Patient Medications llergies: penicillin, doxycycline A Notifications Medication Indication Start End 02/13/2021 Bactrim DS DOSE 1 - oral 800 mg-160 mg tablet - 1 tablet oral taken 2 times per day for 14 days Electronic Signature(s) Signed: 02/13/2021 11:23:11 AM By: [name] PA-C Entered By: [name] on 02/13/2021 11:23:10 -------------------------------------------------------------------------------- Problem List Details Patient Name: Date of Service: [facility] [name], [name] 02/13/2021 10:00 [place] Record Number: [id number] Patient Account Number: [id number] Date of Birth/Sex: Treating RN: 1955-10-09 (65 y.o. [name] Provider: [name] Other Clinician: Referring Provider: Treating Provider/Extender: [name] in Treatment: 45 Active Problems ICD-10 Encounter Code Description Active Date MDM Diagnosis I87.332 Chronic venous hypertension (idiopathic) with ulcer and inflammation of left 01/17/2020 No Yes lower extremity L97.828 Non-pressure chronic ulcer of other part of left lower leg with other specified 01/17/2020 No Yes severity L97.328 Non-pressure chronic ulcer of left ankle with other specified severity 01/17/2020 No Yes Inactive Problems ICD-10 Code Description Active Date Inactive Date L03.116 Cellulitis of left lower limb 01/31/2020 01/31/2020 T81.31XD Disruption of external operation (surgical) wound, not elsewhere classified, subsequent 01/17/2020 01/17/2020 encounter Resolved Problems Electronic Signature(s) Signed: 02/13/2021 9:55:47 AM By: [name] PA-C Entered By: [name] on 02/13/2021 09:55:47 -------------------------------------------------------------------------------- Progress Note Details Patient Name: Date of Service: [facility] [name], [name] 02/13/2021 10:00 A M Medical Record Number: [id number] Patient Account Number: [id number] Date of Birth/Sex: Treating RN: March 16, 1956 (65 y.o. [name] Provider: [name] Other Clinician: Referring Provider: Treating Provider/Extender: [name] in Treatment: 37 Subjective Chief Complaint Information obtained from Patient patient is been followed long-term in this clinic for venous insufficiency ulcers with inflammation, hypertension and ulceration over the medial ankle bilaterally. 01/17/2020; this is a patient who is here for review of postoperative wounds on the left lateral ankle and recurrence of venous stasis ulceration on the left medial History of Present Illness (HPI) the remaining wound is over the left medial ankle. Similar wound over the right medial ankle healed largely with use of Apligraf. Most recently we have been using Hydrofera Blue over this wound with considerable improvement. The patient has been extensively worked up in the past for her venous insufficiency and she is not a candidate for antireflux surgery although I have none of the details available currently. 08/24/14; considerable improvement today. About 50% of this wound areas now epithelialized. The base of the wound appears to be healthier granulation.as opposed to last week when she had deteriorated a considerable improvement 08/17/14; unfortunately the wound has regressed somewhat. The areas of epithelialization from the superior aspect are not nearly as healthy as they were last week. The patient thinks her Hydrofera Blue slipped. 09/07/14; unfortunately the area has markedly regressed in the 2 weeks since I've seen this. There is an odor  surrounding erythema. The healthy granulation tissue that we had at the base of the wound now is a dusky color. The nurse reports green drainage 09/14/14; the area looks somewhat better than last week. There is less erythema and less drainage. The culture I did did not show any growth. Nevertheless I think it is better to continue the Cipro and doxycycline for a further week. The remaining wound area was debridement. 09/21/14. Wound did not require debridement last week. Still less erythema and less drainage. She can complete her antibiotics. The  areas of epithelialization in the superior aspect of the wound do not look as healthy as they did some weeks ago 10/05/14 continued improvement in the condition of this wound. There is advancing epithelialization. Less aggressive debridement required 10/19/14 continued improvement in the condition and volume of this wound. Less aggressive debridement to the inferior part of this to remove surface slough and fibrinous eschar 11/02/14 no debridement is required. The surface granulation appears healthy although some of her islands of epithelialization seem to have regressed. No evidence of infection 11/16/14; lites surface debridement done of surface eschar. The wound does not look to be unhealthy. No evidence of infection. Unfortunately the patient has had podiatry issues in the right foot and for some reason has redeveloped small surface ulcerations in the medial right ankle. Her original presentation involved wounds in this area 11/23/14 no debridement. The area on the right ankle has enlarged. The left ankle wound appears stable in terms of the surface although there is periwound inflammation. There has been regression in the amount of new skin 11/30/14 no debridement. Both wound areas appear healthy. There was no evidence of infection. The the new area on the right medial ankle has enlarged although that both the surfaces appear to be stable. 12/07/14; Debridement  of the right medial ankle wound. No no debridement was done on the left. 12/14/14 no major change in and now bilateral medial ankle wounds. Both of these are very painful but the no overt evidence of infection. She has had previous venous ablation 12/21/14; patient states that her right medial ankle wound is considerably more painful last week than usual. Her left is also somewhat painful. She could not tolerate debridement. The right medial ankle wound has fibrinous surface eschar 12/28/14 this is a patient with severe bilateral venous insufficiency ulcers. For a considerable period of time we actually had the one on the right medial ankle healed however this recently opened up again in June. The left medial ankle wound has been a refractory area with some absent flows. We had some success with Hydrofera Blue on this area and it literally closed by 50% however it is recently opened up Foley. Both of these were debridement today of surface eschar. She tolerates this poorly 01/25/15: No change in the status of this. Thick adherent escar. Very poor tolerance of any attempt at debridement. I had healed the right medial malleolus wound for a considerable amount of time and had the left one down to about 50% of the volume although this is totally regressed over the last 48 weeks. Further the right leg has reopened. she is trying to make a appointment with pain and vascular, previous ablations with Dr. Aleda Grana. I do not believe there is an arterial insufficiency issue here 02/01/15 the status of the adherent eschar bilaterally is actually improved. No debridement was done. She did not manage to get vascular studies done 02/08/15 continued debridement of the area was done today. The slough is less adherent and comes off with less pressure. There is no surrounding infection peripheral pulses are intact 02/15/15 selective debridement with a disposable curette. Again the slough is less adherent and comes off with  less difficulty. No surrounding infection peripheral pulses are intact. 02/22/15 selective debridement of the right medial ankle wound. Slough comes off with less difficulty. No obvious surrounding infection peripheral pulses are intact I did not debridement the one on the left. Both of these are stable to improved 03/01/15 selective debridement of both wound areas using a  curette to. Adherent slough cup soft with less difficulty. No obvious surrounding infection. The patient tells me that 2 days ago she noted a rash above the right leg wrap. She did not have this on her lower legs when she change this over she arrives with widespread left greater than right almost folliculitis-looking rash which is extremely pruritic. I don't see anything to culture here. There is no rash on the rest of her body. She feels well systemically. 03/08/15; selective debridement of both wounds using a curette. Base of this does not look unhealthy. She had limegreen drainage coming out of the left leg wound and describes a lot of drainage. The rash on her left leg looks improved to. No cultures were done. 03/22/15; patient was not here last week. Basal wounds does not look healthy and there is no surrounding erythema. No drainage. There is still a rash on the left leg that almost looks vasculitic however it is clearly limited to the top of where the wrap would be. 04/05/15; on the right required a surgical debridement of surface eschar and necrotic subcutaneous tissue. I did not debridement the area on the left. These continue to be large open wounds that are not changing that much. We were successful at one point in healing the area on the right, and at the same time the area on the left was roughly half the size of current measurements. I think a lot of the deterioration has to do with the prolonged time the patient is on her feet at work 04/19/15 I attempted-like surface debridement bilaterally she does not tolerate this.  She tells me that she was in allergic care yesterday with extreme pain over her left lateral malleolus/ankle and was told that she has an "sprain" 05/03/15; large bilateral venous insufficiency wounds over the medial malleolus/medial aspect of her ankles. She complains of copious amounts of drainage and his usual large amounts of pain. There is some increasing erythema around the wound on the right extending into the medial aspect of her foot to. historically she came in with these wounds the right one healed and the left one came down to roughly half its current size however the right one is reopened and the left is expanded. 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 05/10/15 large bilateral venous insufficiency wounds. There is less adherence surface left however the surface culture that I did last week grew pseudomonas therefore bilateral selective debridement score necessary. There is surrounding erythema. The patient describes severe bilateral drainage and a lot of pain in the left ankle. Apparently her podiatrist was were ready to do a cortisone shot 05/17/15; the patient complains of pain and again copious amounts of drainage. 05/24/15; we used Iodo flex last week. Patient notes considerable improvement in wound drainage. Only needed to change this once. 05/31/15; we continued Iodoflex; the base of these large wounds bilaterally is not too bad but there is probably likely a significant bioburden here. I would like to debridement just doesn't tolerate it. 06/06/14 I would like to continue the Iodoflex although she still hasn't managed to obtain supplies. She has bilateral medial malleoli or large wounds which are mostly superficial. Both of them are covered circumferentially with some nonviable fibrinous slough although she tolerates debridement very poorly. She apparently has an appointment for an ablation on the right leg by interventional  radiology. 06/14/15; the patient arrives with the wounds and static condition.  We attempted a debridement although she does not do well with this secondary to pain. I 07/05/15; wounds are not much smaller however there appears to be a cleaner granulating base. The left has tight fibrinous slough greater than the right. Debridement is tolerated poorly due to pain. Iodoflex is done more for these wounds in any of the multitude of different dressings I have tried on the left 1 and then subsequently the right. 07/12/15; no change in the condition of this wound. I am able to do an aggressive debridement on the right but not the left. She simply cannot tolerate it. We have been using Iodoflex which helps somewhat. It is worthwhile remembering that at one point we healed the right medial ankle wound and the left was about 25% of the current circumference. We have suggested returning to vascular surgery for review of possible further ablations for one reason or another she has not been able to do this. 07/26/15 no major change in the condition of either wound on her medial ankle. I did not attempt to debridement of these. She has been aggressively scrubbing these while she is in the shower at home. She has her supply of Iodoflex which seems to have done more for these wounds then anything I have put on recently. 08/09/15 wound area appears larger although not verified by measurements. Using Iodoflex 09/05/2015 -- she was here for avisit today but had significant problems with the wound and I was asked to see her for a physician opinion. I have summarize that this lady has had surgery on her left lower extremity about 10 years ago where the possible veins stripping was done. She has had an opinion from interventional radiology around November 2016 where no further sclerotherapy was ordered. The patient works 12 hours a day and stands on a concrete floor with work boots and is unable to get the proper compression she  requires and cannot elevate her limbs appropriately at any given time. She has recently grown Pseudomonas from her wound culture but has not started her ciprofloxacin which was called in for her. 09/13/15 this continues to be a difficult situation for this patient. At one point I had this wound down to a 1.5 x 1.5" wound on her left leg. This is deteriorated and the right leg has reopened. She now has substantial wounds on her medial calcaneus, malleoli and into her lower leg. One on the left has surface eschar but these are far too painful for me to debridement here. She has a vascular surgery appointment next week to see if anything can be done to help here. I think she has had previous ablations several years ago at [facility]. She has no major edema. She tells me that she did not get product last time [facility] Ag] and went for several days without it. She continues to work in work boots 12 hours a day. She cannot get compression/4-layer under her work boots. 09/20/15 no major change. Periwound edema control was not very good. Her point with pain and vascular is next Wednesday the 25th 09/28/15; the patient is seen vascular surgery and is apparently scheduled for repeat duplex ultrasounds of her bilateral lower legs next week. 10/05/15; the patient was seen by Dr. Doren Custard of vascular surgery. He feels that she should have arterial insufficiency excluded as cause/contributed to her nonhealing stage she is therefore booked for an arteriogram. She has apparently monophasic signals in the dorsalis pedis pulses. She also of course has known severe chronic  venous insufficiency with previous procedures as noted previously. I had another long discussion with the patient today about her continuing to work 12 hour shifts. I've written her out for 2 months area had concerns about this as her work location is currently undergoing significant turmoil and this may lead to her termination. She is aware of this however  I agree with her that she simply cannot continue to stand for 12 hours multiple days a week with the substantial wound areas she has. 10/19/15; the Dr. Doren Custard appointment was largely for an arteriogram which was normal. She does not have an arterial issue. He didn't make a comment about her chronic venous insufficiency for which she has had previous ablations. Presumably it was not felt that anything additional could be done. The patient is now out of work as I prescribed 2 weeks ago. Her wounds look somewhat less aggravated presumably because of this. I felt I would give debridement another try today 10/25/15; no major change in this patient's wounds. We are struggling to get her product that she can afford into her own home through her insurance. 11/01/15; no major change in the patient's wounds. I have been using silver alginate as the most affordable product. I spoke to Dr. Marla Roe last week with her requested take her to the OR for surgical debridement and placement of ACEL. Dr. Marla Roe told me that she would be willing to do this however [facility] will not cover this, fortunately the patient has [place] healthcare of some variant 11/08/15; no major change in the patient's wounds. She has been completely nonviable surface that this but is in too much pain with any attempted debridement are clinic. I have arranged for her to see Dr. Marla Roe ham of plastic surgery and this appointment is on Monday. I am hopeful that they will take her to the OR for debridement, possible ACEL ultimately possible skin graft 11/22/15 no major change in the patient's wounds over her bilateral medial calcaneus medial malleolus into the lower legs. Surface on these does not look too bad however on the left there is surrounding erythema and tenderness. This may be cellulitis or could him sleepy tinea. 11/29/15; no major changes in the patient's wounds over her bilateral medial malleolus. There is no infection  here and I don't think any additional antibiotics are necessary. There is now plan to move forward. She sees Dr. Marla Roe in a week's time for preparation for operative debridement and ACEL placement I believe on 7/12. She then has a follow-up appointment with Dr. Marla Roe on 7/21 12/28/15; the patient returns today having been taken to the [facility] by Dr. Marla Roe 12/12/15 she underwent debridement, intraoperative cultures [which were negative]. She had placement of a wound VAC. Parent really ACEL was not available to be placed. The wound VAC foam apparently adhered to the wound since then she's been using silver alginate, Xeroform under Ace wraps. She still says there is a lot of drainage and a lot of pain 01/31/16; this is a patient I see monthly. I had referred her to Dr. Marla Roe him of plastic surgery for large wounds on her bilateral medial ankles. She has been to the OR twice once in early July and once in early August. She tells me over the last 3 weeks she has been using the wound VAC with ACEL underneath it. On the right we've simply been using silver alginate. Under Kerlix Coban wraps. 02/28/16; this is a patient I'm currently seeing monthly. She is gone  on to have a skin graft over her large venous insufficiency ulcer on the left medial ankle. This was done by Dr. Marla Roe him. The patient is a bit perturbed about why she didn't have one on her right medial ankle wound. She has been using silver alginate to this. 03/06/16; I received a phone call from her plastic surgery Dr. Marla Roe. She expressed some concern about the viability of the skin graft she did on the left medial ankle wound. Asked me to place Endoform on this. She told me she is not planning to do a subsequent skin graft on the right as the left one did not take very well. I had placed Hydrofera Blue on the right 03/13/16; continue to have a reasonably healthy wound on the right medial ankle. Down to 3 mm in terms of size.  There is epithelialization here. The area on the left medial ankle is her skin graft site. I suppose the last week this looks somewhat better. She has an open area inferiorly however in the center there appears to be some viable tissue. There is a lot of surface callus and eschar that will eventually need to come off however none of this looked to be infected. Patient states that the is able to keep the dressing on for several days which is an improvement. 03/20/16 no major change in the circumference of either wound however on the left side the patient was at Dr. Eusebio Friendly office and they did a debridement of left wound. 50% of the wound seems to be epithelialized. I been using Endoform on the left Hydrofera Blue in the right 03/27/16; she arrives today with her wound is not looking as healthy as they did last week. The area on the right clearly has an adherent surface to this a very similar surface on the left. Unfortunately for this patient this is all too familiar problem. Clearly the Endoform is not working and will need to change that today that has some potential to help this surface. She does not tolerate debridement in this clinic very well. She is changing the dressing wants 04/03/16; patient arrives with the wounds looking somewhat better especially on the right. Dr. Migdalia Dk change the dressing to silver alginate when she saw her on Monday and also sold her some compression socks. The usefulness of the latter is really not clear and woman with severely draining wounds. 04/10/16; the patient is doing a bit of an experiment wearing the compression stockings that Dr. Migdalia Dk provided her to her left leg and the out of legs based dressings that we provided to the right. 05/01/16; the patient is continuing to wear compression stockings Dr. Migdalia Dk provided her on the left that are apparently silver impregnated. She has been using Iodoflex to the right leg wound. Still a moderate amount of drainage,  when she leaves here the wraps only last for 4 days. She has to change the stocking on the left leg every night 05/15/16; she is now using compression stockings bilaterally provided by Dr. Marla Roe. She is wearing a nonadherent layer over the wounds so really I don't think there is anything specific being done to this now. She has some reduction on the left wound. The right is stable. I think all healing here is being done without a specific dressing 06/09/16; patient arrives here today with not much change in the wound certainly in diameter to large circular wounds over the medial aspect of her ankle bilaterally. Under the light of these services are certainly  not viable for healing. There is no evidence of surrounding infection. She is wearing compression stockings with some sort of silver impregnation as prescribed by Dr. Marla Roe. She has a follow-up with her tomorrow. 06/30/16; no major change in the size or condition of her wounds. These are still probably covered with a nonviable surface. She is using only her purchase stockings. She did see Dr. Marla Roe who seemed to want to apply Dakin's solution to this I'm not extreme short what value this would be. I would suggest Iodoflex which she still has at home. 07/28/16; I follow [name] episodically along with Dr. Marla Roe. 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 She has been applying a topical collagen ointment to both wounds with Adaptic. I don't think Dr. Marla Roe is planning to take her back to the OR. 08/19/16; I follow [name] on a monthly basis along with Dr. Marla Roe of plastic surgery. 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 I been following her for a number of years. At one point I was able to get the right medial malleolus wound to heal and had the left medial malleolus down to about half  its current size however and I had to send her to plastic surgery for an operative debridement. Since then things have been stable to slightly improve the area on the right is slightly better one in the left about the same although there is much less adherent surface than I'm used to with this patient. She is using some form of liquid collagen gel that Dr. Marla Roe provided a Kerlix cover with the patient's own pressure stockings. She tells me that she has extreme pain in both ankles and along the lateral aspect of both feet. She has been unable to work for some period of time. She is telling me she is retiring at the beginning of April. She sees Dr. Doran Durand of orthopedics next week 09/22/16; patient has not seen Dr. Marla Roe since the last time she is here. I'm not really sure what she is using to the wounds other than bits and pieces of think she had left over including most recently Hydrofera Blue. She is using juxtalite stockings. She is having difficulty with her husband's recent illness "stroke". She is having to transport him to various doctors appointments. Dr. Marla Roe left her the option of a repeat debridement with ACEL however she has not been able to get the time to follow-up on this. She continues to have a fair amount of drainage out of these wounds with certainly precludes leaving dressings on all week 10/13/16; patient has not seen Dr. Marla Roe since she was last in our clinic. I'm not really sure what she is doing with the wounds, we did try to get her [facility] and I think she is actually using this most of the time. Because of drainage she states she has to change this every second day although this is an improvement from what she used to do. She went to see Dr. Doran Durand who did not think she had a muscular issue with regards to her feet, he referred her to a neurologist and I think the appointment is sometime in June. I changed her back to Iodoflex which she has used in the  past but not recently. 11/03/16; the patient has been using Iodoflex although she ran out of this. Still claims that there is a lot of drainage  although the wound does not look like this. No surrounding erythema. She has not been back to see Dr. Marla Roe 11/24/16; the patient has been using Iodoflex again but she ran out of it 2 or 3 days ago. There is no major change in the condition of either one of these wounds in fact they are larger and covered in a thick adherent surface slough/nonviable tissue especially on the left. She does not tolerate mechanical debridement in our clinic. Going back to see Dr. Marla Roe of plastic surgery for an operative debridement would seem reasonable. 12/15/16; the patient has not been back to see Dr. Marla Roe. She is been dealing with a series of illnesses and her husband which of monopolized her time. She is been using Sorbact which we largely supplied. She states the drainage is bad enough that it maximum she can go 2-3 days without changing the dressing 01/12/2017 -- the patient has not been back for about 4 weeks and has not seen Dr. Marla Roe not does she have any appointment pending. 01/23/17; patient has not seen Dr. Marla Roe even though I suggested this previously. She is using Santyl that was suggested last week by Dr. Con Memos this Cost her $16 through her insurance which is indeed surprising 02/12/17; continuing Santyl and the patient is changing this daily. A lot of drainage. She has not been back to see plastic surgery she is using an Ace wrap. Our intake nurse suggested wrap around stockings which would make a good reasonable alternative 02/26/17; patient is been using Santyl and changing this daily due to drainage. She has not been to see plastic surgery she uses in April Ace wrap to control the edema. She did obtain extremitease stockings but stated that the edema in her leg was to big for these 03/20/17; patient is using Santyl and Anasept. Surfaces  looked better today the area on the right is actually measuring a little smaller. She has states she has a lot of pain in her feet and ankles and is asking for a consult to pain control which I'll try to help her with through our case manager. 04/10/17; the patient arrives with better-looking wound surfaces and is slightly smaller wound on the left she is using a combination of Santyl and Anasept. She has an appointment or at least as started in the pain control center associated with [facility] 05/14/17; this is a patient who I followed for a prolonged period of time. She has venous insufficiency ulcers on her bilateral medial ankles. At one point I had this down to a much smaller wound on the left however these reopened and we've never been able to get these to heal. She has been using Santyl and Anasept gel although 2 weeks ago she ran out of the Anasept gel. She has a stable appearance of the wound. She is going to the wound care clinic at [facility]. They wanted do a nerve block/spinal block although she tells me she is reluctant to go forward with that. 05/21/17; this is a patient I have followed for many years. She has venous insufficiency ulcers on her bilateral medial ankles. Chronic pain and deformity in her ankles as well. She is been to see plastic surgery as well as orthopedics. Using PolyMem AG most recently/Kerramax/ABDs and 2 layer compression. She has managed to keep this on and she is coming in for a nurse check to change the dressing on Tuesdays, we see her on Fridays 06/05/17; really quite a good looking surface and the  area especially on the right medial has contracted in terms of dimensions. Well granulated healthy-looking tissue on both sides. Even with an open curet there is nothing that even feels abnormal here. This is as good as I've seen this in quite some time. We have been using PolyMem AG and bringing her in for a nurse check 06/12/17; really quite good surface  on both of these wounds. The right medial has contracted a bit left is not. We've been using PolyMem and AG and she is coming in for a nurse visit 06/19/17; we have been using PolyMem AG and bringing her in for a nurse check. Dimensions of her wounds are not better but the surfaces looked better bilaterally. She complained of bleeding last night and the left wound and increasing pain bilaterally. She states her wound pain is more neuropathic than just the wounds. There was some suggestion that this was radicular from her pain management doctor in talking to her it is really difficult to sort this out. 06/26/17; using PolyMem and AG and bringing her in for a nurse check as All of this and reasonably stable condition. Certainly not improved. The dimensions on the lateral part of the right leg look better but not really measuring better. The medial aspect on the left is about the same. 07/03/16; we have been using PolyMen AG and bringing her in for a nurse check to change the dressings as the wounds have drainage which precludes once weekly changing. We are using all secondary absorptive dressings.our intake nurse is brought up the idea of using a wound VAC/snap VAC on the wound to help with the drainage to see if this would result in some contraction. This is not a bad idea. The area on the right medial is actually looking smaller. Both wounds have a reasonable-looking surface. There is no evidence of cellulitis. The edema is well controlled 07/10/17; the patient was denied for a snap VAC by her insurance. The major issue with these wounds continues to be drainage. We are using wicked PolyMem AG and she is coming in for a nurse visit to change this. The wounds are stable to slightly improved. The surface looks vibrant and the area on the right certainly has shrunk in size but very slowly 07/17/17; the patient still has large wounds on her bilateral medial malleoli. Surface of both of these wounds looks better.  The dimensions seem to come and go but no consistent improvement. There is no epithelialization. We do not have options for advanced treatment products due to insurance issues. They did not approve of the wound VAC to help control the drainage. More recently we've been using PolyMem and AG wicked to allow drainage through. We have been bringing her in for a nurse visit to change this. We do not have a lot of options for wound care products and the home again due to insurance issues 07/24/17; the patient's wound actually looks somewhat better today. No drainage measurements are smaller still healthy-looking surface. We used silver collagen under PolyMen started last week. We have been bringing her in for a dressing change 07/31/17; patient's wound surface continued to look better and I think there is visible change in the dimensions of the wound on the right. Rims of epithelialization. We have been using silver collagen under PolyMen and bringing her in for a dressing change. There appears to be less drainage although she is still in need of the dressing change 08/07/17. Patient's wound surface continues to look better on  both sides and the area on the right is definitely smaller. We have been using silver collagen and PolyMen. She feels that the drainage has been it has been better. I asked her about her vascular status. She went to see Dr. Aleda Grana at [facility] and had some form of ablation. I don't have much detail on this. I haven't my notes from 2016 that she was not a candidate for any further ablation but I don't have any more information on this. We had referred her to vein and vascular I don't think she ever went. He does not have a history of PAD although I don't have any information on this either. We don't even have ABIs in our record 08/14/17; we've been using silver collagen and PolyMen cover. And putting the patient and compression. She we are bringing her in as a nurse visit to  change this because ofarge amount of drainage. We didn't the ABIs in clinic today since they had been done in many moons 1.2 bilaterally. She has been to see vein and vascular however this was at [facility] and she had ablation although I really don't have any information on this all seemed biking get a report. She is also been operatively debrided by plastic surgery and had a cell placed probably 8-12 months ago. This didn't have a major effect. We've been making some gains with current dressings 08/19/17-She is here in follow-up evaluation for bilateral medial malleoli ulcers. She continues to tolerate debridement very poorly. We will continue with recently changed topical treatment; if no significant improvement may consider switching to Iodosorb/Iodoflex. 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 She has been using silver collagen and PolyMem. I believe she has been used and tried on Iodoflex before. 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 These have been very refractory over time. We have been using silver collagen and PolyMen. She is coming in once a week for a doctor's and once a week for nurse visits. We are actually making some progress 09/18/17; the patient's wounds are smaller especially on the right medial. She arrives today to upset to consider even washing these off with Anasept which I think is been part of the reason this is been closing. We've been using collagen covered in PolyMen otherwise. It is noted that she has a small area of folliculitis on the right medial calf that. As we are wrapping her legs I'll give her a short course of doxycycline to make sure this doesn't amount to anything. She is a long list of complaints today including  imbalance, shortness of breath on exertion, inversion of her left ankle. With regards to the latter complaints she is been to see orthopedics and they offered her a tendon release surgery I believe but wanted her wounds to be closed first. I have recommended she go see her primary physician with regards to everything else. 09/25/17; patient's wounds are about the same size. We have made some progress bilaterally although not in recent weeks. She will not allow me T wash these wounds with Anasept even if she is doing her cell. Wheeze we've been using collagen covered in PolyMen. Last week she had a small area of folliculitis this is now opened into a small wound. She completed 5 days of trimethoprim  sulfamethoxazole 10/02/17; unfortunately the area on her left medial ankle is worse with a larger wound area towards the Achilles. The patient complains of a lot of pain. She will not allow debridement although visually I don't think there is anything to debridement in any case. We have been using silver collagen and PolyMen for several months now. Initially we are making some progress although I'm not really seeing that today. We will move back to [facility]. His admittedly this is a bit of a repeat however I'm hoping that his situation is different now. The patient tells me she had her leg on the left give out on her yesterday this is process some pain. 10/09/17; the patient is seen twice a week largely because of drainage issues coming out of the chronic medial bimalleolar wounds that are chronic. Last week the dimensions of the one on the left looks a little larger I changed her to [facility]. She comes in today with a history of terrible pain in the bilateral wound areas. She will not allow debridement. She will not even allow a tissue culture. There is no surrounding erythema no no evidence of cellulitis. We have been putting her Kerlix Coban man. She will not allow more aggressive compression as  there was a suggestion to put her in 3 layer wraps. 10/16/17; large wounds on her bilateral medial malleoli. These are chronic. Not much change from last week. The surface looks have healthy but absolutely no epithelialization. A lot of pain little less so of drainage. She will not allow debridement or even washing these off in the vigorous fashion with Anasept. 10/23/17; large wounds on her bilateral malleoli which are chronic. Some improvement in terms of size perhaps on the right since last time I saw these. She states that after we increased the 3 layer compression there was some bleeding, when she came in for a nurse visit she did not want 3 layer compression put back on about our nurse managed to convince her. She has known chronic venous visit issues and I'm hoping to get her to tolerate the 3 layer compression. using Hydrofera Blue 10/30/17; absolutely no change in the condition of either wound although we've had some improvement in dimensions on the right.. Attempted to put her in 3 layer compression she didn't tolerated she is back in 2 layer compression. We've been using Hydrofera Blue We looked over her past records. She had venous reflux studies in November 2016. There was no evidence of deep venous reflux on the right. Superficial vein did not show the greater saphenous vein at think this is been previously ablated the small saphenous vein was within normal limits. The left deep venous system showed no DVT the vessels were positive for deep venous reflux in the posterior tibial veins at the ankle. The greater saphenous vein was surgically absent small saphenous vein was within normal limits. She went to vein and vascular at [facility]. I believe she had an ablation on the left greater saphenous vein. I'll update her reflux studies perhaps ever reviewed by vein and vascular. We've made absolutely no progress in these wounds. Will also try to read and TheraSkins through her  insurance 11/06/17; W the patient apparently has a 2 week follow-up with vein and vascular I like him to review the whole issue with regards to her previous vascular workup by Dr. Aleda Grana. We've really made no progress on these wounds in many months. She arrives today with less viable looking surface on the  left medial ankle wound. This was apparently looking about the same on Tuesday when she was here for nurse visit. 11/13/17; deep tissue culture I did last time of the left lower leg showed multiple organisms without any predominating. In particular no Staphylococcus or group A strep were isolated. We sent her for venous reflux studies. She's had a previous left greater saphenous vein stripping and I think sclerotherapy of the right greater saphenous vein. She didn't really look at the lesser saphenous vein this both wounds are on the medial aspect. She has reflux in the common femoral vein and popliteal vein and an accessory vein on the right and the common femoral vein and popliteal vein on the left. I'm going to have her go to see vein and vascular just the look over things and see if anything else beside aggressive compression is indicated here. We have not been able to make any progress on these wounds in spite of the fact that the surface of the wounds is never look too bad. 11/20/17; no major change in the condition of the wounds. Patient reports a large amount of drainage. She has a lot of complaints of pain although enlisting her today I wonder if some of this at least his neuropathic rather than secondary to her wounds. She has an appointment with vein and vascular on 12/30/17. The refractory nature of these wounds in my mind at least need vein and vascular to look over the wounds the recent reflux studies we did and her history to see if anything further can be done here. I also note her gait is deteriorated quite a bit. 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 She has a  bilateral Trendelenburg gait. I wonder if this is neuropathic or perhaps multilevel radicular. 11/27/17; her wounds actually looks slightly better. Healthy-looking granulation tissue a scant amount of epithelialization. [place] [facility] will not pay for [employer]. They will play for tri layer Oasis and Dermagraft. This is not a diabetic ulcer. We'll try for the tri layer Oasis. She still complains of some drainage. She has a vein and vascular appointment on 12/30/17 12/04/17; the wounds visually look quite good. Healthy-looking granulation with some degree of epithelialization. We are still waiting for response to our request for trial to try layer Oasis. Her appointment with vascular to review venous and arterial issues isn't sold the end of July 7/31. Not allow debridement or even vigorous cleansing of the wound surface. 12/18/17; slightly smaller especially on the right. 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 We've been using Hydrofera Blue. We still are looking into triple layer Oasis through her insurance 01/08/18 on evaluation today patient's wound actually appears to be showing signs of good improvement at this point in time. She has been tolerating the dressing changes without complication. Fortunately there does not appear to be any evidence of infection at this point in time. We have been utilizing silver nitrate which does seem to be of benefit for her which is also good news. Overall I'm very happy with how things seem to be both regards appearance as well as measurement. Patient did see Dr. Bridgett Larsson for evaluation on 12/30/17. In his assessment he felt that stripping would not likely add much more than chronic compression to the patient's healing process. His recommendation was to follow-up in three months with Dr. Doren Custard if she hasn't healed in order to consider referral back to you and see vascular where she previously was in a trial and  was able to get her wound to  heal. I'll be see what she feels she when you staying compression and he reiterated this as well. 01/13/18 on evaluation today patient appears to actually be doing very well in regard to her bilateral medial malleolus ulcers. She seems to have tolerated the chemical cauterization with silver nitrate last week she did have some pain through that evening but fortunately states that I'll be see since it seems to be doing better she is overall pleased with the progress. 01/21/18; really quite a remarkable improvement since I've last seen these wounds. We started using silver nitrate specially on the islands of hyper granulation which for some reason her around the wound circumference. This is really done quite nicely. Primary dressing Hydrofera Blue under 4 layer compression. She seems to be able to hold out without a nurse rewrap. Follow-up in 1 week 01/28/18; we've continued the hydrofera blue but continued with chemical cauterization to the wound area that we started about a month ago for irregular hyper granulation. She is made almost stunning improvement in the overall wound dimensions. I was not really expecting this degree of improvement in these chronic wounds 02/05/18; we continue with Hydrofera Bluebut of also continued the aggressive chemical cauterization with silver nitrate. We made nice progress with the right greater than left wound. 02/12/18. We continued with Hydrofera Blue after aggressive chemical cauterization with silver nitrate. We appear to be making nice progress with both wound areas 02/19/2018; we continue with [facility] after washing the wounds vigorously with Anasept spray and chemical cauterization with silver nitrate. We are making excellent progress. The area on the right's just about closed 02/26/2018. The area on the left medial ankle had too much necrotic debris today. I used a #5 curette we are able to get most of the soft. I continued with the silver nitrate to the much  smaller wound on the right medial ankle she had a new area on her right lower pretibial area which she says was due to a role in her compression 03/05/2018; both wound areas look healthy. Not much change in dimensions from last week. I continue to use silver nitrate and Hydrofera Blue. The patient saw Dr. Doren Custard of vein and vascular. He felt she had venous stasis ulcers. He felt based on her previous arteriogram she should have adequate circulation for healing. Also she has deep venous reflux but really no significant correctable superficial venous reflux at this time. He felt we should continue with conservative management including leg elevation and compression 04/02/2018; since we last saw this woman about a month ago she had a fall apparently suffered a pelvic fracture. I did not look up the x-ray. Nevertheless because of pain she literally was bedbound for 2 weeks and had home health coming out to change the dressing. Somewhat predictably this is resulted in considerable improvement in both wound areas. The right is just about closed on the medial malleolus and the left is about half the size. 04/16/2018; both her wounds continue to go down in size. Using Hydrofera Blue. 05/07/18; both her wounds appeared to be improving especially on the right where it is almost closed. We are using Hydrofera Blue 05/14/2018; slightly worse this week with larger wounds. Surface on the left medial not quite as good. We have been using Hydrofera Blue 05/21/18; again the wounds are slightly larger. Left medial malleolus slightly larger with eschar around the circumference. We have been using Hydrofera Blue undergoing a wraps for a prolonged  period of time. This got a lot better when she was more recumbent due to a fall and a back injury. I change the primary dressing the silver alginate today. She did not tolerate a 4 layer compression previously although I may need to bring this up with her next time 05/28/2018; area  on the left medial malleolus again is slightly larger with more drainage. Area on the right is roughly unchanged. She has a small area of folliculitis on the right medial just on the lower calf. This does not look ominous. 06/03/2018 left medial malleolus slightly smaller in a better looking surface. We used silver nitrate on this last time with silver alginate. The area on the right appears slightly smaller 1/10; left medial malleolus slightly smaller. Small open area on the right. We used silver nitrate and silver alginate as of 2 weeks ago. We continue with the wound and compression. These got a lot better when she was off her feet 1/17; right medial malleolus wound is smaller. The left may be slightly smaller. Both surfaces look somewhat better. 1/24; both wounds are slightly smaller. Using silver alginate under Unna boots 1/31; both wounds appear smaller in fact the area on the right medial is just about closed. Surface eschar. We have been using silver alginate under Unna boots. The patient is less active now spends let much less time on her feet and I think this is contributed to the general improvement in the wound condition 2/7; both wounds appear smaller. I was hopeful the right medial would be closed however there there is still the same small open area. Slight amount of surface eschar on the left the dimensions are smaller there is eschar but the wound edges appear to be free. We have been using silver alginate under Unna boot's 2/14; both wounds once again measure smaller. Circumferential eschar on the left medial. We have been using silver alginate under Unna boots with gradual improvement 2/21; the area on the right medial malleolus has healed. The area on the left is smaller. We have been using silver alginate and Unna boots. We can discharge wrapping the right leg she has 20/30 stockings at home she will need to protect the scar tissue in this area 2/28; the area on the right medial  malleolus remains closed the patient has a compression stocking. The area on the left is smaller. We have been using silver alginate and Unna boots. 3/6 the area on the right medial ankle remains closed. Good edema control noted she is using her own compression stocking. The area on the left medial ankle is smaller. We have been managing this with silver alginate and Unna boots which we will continue today. 3/13; the area on the right medial ankle remains closed and I'm declaring it healed today. When necessary the left is about the same still a healthy-looking surface but no major change and wound area. No evidence of infection and using silver alginate under unna and generally making considerable improvement 3/27 the area on the right medial ankle remains closed the area on the left is about the same as last week. Certainly not any worse we have been using silver alginate under an Unna boot 4/3; the area on the right medial ankle remains closed per the patient. We did not look at this wound. The wound on the left medial ankle is about the same surface looks healthy we have been using silver alginate under an Unna boot 4/10; area on the right medial ankle remains  closed per the patient. We did not look at this wound. The wound on the left medial ankle is slightly larger. The patient complains that the [facility] caused burning pain all week. She also told us that she was a lot more active this week. Changed her back to silver alginate 4/17; right medial ankle still closed per the patient. Left medial ankle is slightly larger. Using silver alginate. She did not tolerate Hydrofera Blue on this area 4/24; right medial ankle remains closed we have not look at this. The left medial ankle continues to get larger today by about a centimeter. We have been using silver alginate under Unna boots. She complains about 4 layer compression as an alternative. She has been up on her feet working on her  garden 5/8; right medial ankle remains closed we did not look at this. The left medial ankle has increased in size about 100%. We have been using silver alginate under Unna boots. She noted increased pain this week and was not surprised that the wound is deteriorated 5/15; no major change in SA however much less erythema ( one week of doxy ocellulitis). 5/22-65 year old female returns at 1 week to the clinic for left medial ankle wound for which we have been using silver alginate under 3 layer compression She was placed on DOXY at last visit - the wound is wider at this visit. She is in 3 layer compression 5/29; change to [facility] last week. I had given her empiric doxycycline 2 weeks ago for a week. She is in 3 layer compression. She complains of a lot of pain and drainage on presentation today. 6/5; using Hydrofera Blue. I gave her doxycycline recently empirically for erythema and pain around the wound. Believe her cultures showed enterococcus which not would not have been well covered by doxycycline nevertheless the wound looks better and I don't feel specifically that the enterococcus needs to be covered. She has a new what looks like a wrap injury on her lateral left ankle. 6/12; she is using Hydrofera Blue. She has a new area on the left anterior lower tibial area. This was a wrap injury last week. 6/19; the patient is using Hydrofera Blue. She arrived with marked inflammation and erythema around the wound and tenderness. 12/01/18 on evaluation today patient appears to be doing a little bit better based on what I'm hearing from the standpoint of lassos evaluation to this as far as the overall appearance of the wound is concerned. Then sometime substandard she typically sees Dr. Dellia Nims. Nonetheless overall very pleased with the progress that she's made up to this point. No fevers, chills, nausea, or vomiting noted at this time. 7/10; some improvement in the surface area. Aggressively  debrided last week apparently. I went ahead with the debridement today although the patient does not tolerate this very well. We have been using Iodoflex. Still a fair amount of drainage 7/17; slightly smaller. Using Iodoflex. 7/24; no change from last week in terms of surface area. We have been using Iodoflex. Surface looks and continues to look somewhat better 7/31; surface area slightly smaller better looking surface. We have been using Iodoflex. This is under Unna boot compression 8/7-Patient presents at 1 week with Unna boot and Iodoflex, wound appears better 8/14-Patient presents at 1 week with Iodoflex, we use the Unna boot, wound appears to be stable better.Patient is getting Botox treatment for the inversion of the foot for tendon release, Next week 8/21; we are using Iodoflex. Unna boot. The  wound is stable in terms of surface area. Under illumination there is some areas of the wound that appear to be either epithelialized or perhaps this is adherent slough at this point I was not really clear. It did not wipe off and I was reluctant to debride this today. 8/28; we are using Iodoflex in an Unna boot. Seems to be making good improvement. 9/4; using Iodoflex and wound is slightly smaller. 9/18; we are using Iodoflex with topical silver nitrate when she is here. The wound continues to be smaller 10/2; patient missed her appointment last week due to GI issues. She left and Iodoflex based dressing on for 2 weeks. Wound is about the same size about the size of a dime on the left medial lower 10/9 we have been using Iodoflex on the medial left ankle wound. She has a new superficial probable wrap injury on the dorsal left ankle 10/16; we have been using Hydrofera Blue since last week. This is on the left medial ankle 10/23; we have been using Hydrofera Blue since 2 weeks ago. This is on the left medial ankle. Dimensions are better 11/6; using Hydrofera Blue. I think the wound is smaller but still  not closed. Left medial ankle 11/13; we have been using Hydrofera Blue. Wound is certainly no smaller this week. Also the surface not as good. This is the remanent of a very large area on her left medial ankle. 11/20; using Sorbact since last week. Wound was about the same in terms of size although I was disappointed about the surface debris 12/11; 3-week follow-up. Patient was on vacation. Wound is measuring slightly larger we have been using Sorbact. 12/18; wound is about the same size however surface looks better last week after debridement. We have been using Sorbact under compression 1/15 wound is probably twice the size of last time increased in length nonviable surface. We have been using Sorbact. She was running a mild fever and missed her appointment last week 1/22; the wound is come down in size but under illumination still a very adherent debris we have been Hydrofera Blue that I changed her to last week 1/29; dimensions down slightly. We have been using Hydrofera Blue 2/19 dimensions are the same however there is rims of epithelialization under illumination. Therefore more the surface area may be epithelialized 2/26; the patient's wound actually measures smaller. The wound looks healthy. We have been using Hydrofera Blue. I had some thoughts about running Apligraf then I still may do that however this looks so much better this week we will delay that for now 3/5; the wound is small but about the same as last week. We have been using Hydrofera Blue. No debridement is required today. 3/19; the wound is about the size of a dime. Healthy looking wound even under illumination. We have been using Hydrofera Blue. No mechanical debridement is necessary 3/26; not much change from last week although still looks very healthy. We have been using Hydrofera Blue under Unna boots Patient was offered an ankle fusion by podiatry but not until the wound heals with a proceed with this. 4/9; the patient  comes in today with her original wound on the medial ankle looking satisfactory however she has some uncontrolled swelling in the middle part of her leg with 2 new open areas superiorly just lateral to the tibia. I think this was probably a wrap issue. She said she felt uncomfortable during the week but did not call in. We have been using Hydrofera Blue  4/16; the wound on the medial ankle is about the same. She has innumerable small areas superior to this across her mid tibia. I think this is probably folliculitis. She is also been working in the yard doing a lot of sweating 4/30; the patient issue on the upper areas across her mid tibia of all healed. I think this was excessive yard work if I remember. Her wound on the medial ankle is smaller. Some debris on this we have been using Hydrofera Blue under Unna boots 5/7; mid tibia. She has been using Hydrofera Blue under an Unna wrap. She is apparently going for her ankle surgery on June 3 10/28/19-Patient returns to clinic with the ankle wound, we are using Hydrofera Blue under Unna wrap, surgery is scheduled for her left foot for June 3 so she will be back for nurse visit next week READMISSION 01/17/2020 [name] is a 65 year old woman we have had in this clinic for a long period of time with severe venous hypertension and refractory wounds on her medial lower legs and ankles bilaterally. 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 When she was off her legs for a prolonged period example when she fell and suffered a compression fracture things would heal up quite nicely. She is now retired and we managed to heal up the right medial leg wound. The left one was very tiny last time I saw this although still refractory. 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 It got to the point where this was interfering with her walking and she elected to proceed with a ankle arthrodesis to straighten her her ankle and leave her with a functional outcome for mobilization. The patient was referred to Dr. Doren Custard and really this took some time to arrange. Dr. Doren Custard saw her on 12/07/2019. Once again he verified that she had no arterial issues. She had previously had an angiogram several years ago. Follow-up ABIs on the left showed an ABI of 1.12 with triphasic waveforms and a TBI of 0.92. She is felt to have chronic deep venous insufficiency but I do not think it was felt that anything could be done from about this from an ablation point of view. At the time Dr. Doren Custard saw this patient the wounds actually look closed via the pictures in his clinic. The patient finally underwent her surgery on 12/15/2019. This went reasonably well and there was a good anatomic outcome. She developed a small distal wound dehiscence on the lateral part of the surgical wound. However more problematically she is developed recurrence of the wound on the medial left ankle. There are actually 2 wounds here one in the distal lower leg and 1 pretty much at the level of the medial malleolus. It is a more distal area that is more problematic. She has been using Hydrofera Blue which started on Friday before this she was simply Ace wrapping. There was a culture done that showed Pseudomonas and she is on ciprofloxacin. A recent CNS on 8/11 was negative. The patient reports some pain but I generally think this is improving. She is using a cam boot completely nonweightbearing using a walker for pivot transfers and a wheelchair 8/24; not much improvement unfortunately she has a surgical wound on the lateral part in the venous insufficiency wound  medially. The bottom part of the medial insufficiency wound is still necrotic there is exposed tendon here. We have been using Hydrofera Blue under  compression. Her edema control is however better 8/31; patient in for follow-up of his surgical wound on the lateral part of her left leg and chronic venous insufficiency ulcers medially. We put her back in compression last week. She comes in today with a complaint of 3 or 4 days worth of increasing pain. She felt her cam walker was rubbing on the area on the back of her heel. However there is intense erythema seems more likely she has cellulitis. She had 2 cultures done when she was seeing podiatry in the postop. One of them in late July showed Pseudomonas and she received a course of ciprofloxacin the other was negative on 8/11 she is allergic to penicillin with anaphylactoid complaints of hives oral swelling via information in epic 9/9; when I saw this patient last week she had intense anterior erythema around her wound on the right lateral heel and ankle and also into the right medial heel. Some of this was no doubt drainage and her walker boot however I was convinced she had cellulitis. I gave her Levaquin and Bactrim she is finishing up on this now. She is following up with Dr. [name] he saw her yesterday. He is taken her out of the walking boot of course she is still nonweightbearing. Her x-ray was negative for any worrisome features such as soft tissue air etc. Things are a lot better this week. She has home health. We have been using Hydrofera Blue under an [employer] which she put back on yesterday. I did not wrap her last week 9/17; her surrounding skin looks a lot better. In fact the area on the left lateral ankle has just a scant amount of eschar. The only remaining wound is the large area on the left medial ankle. Probably about 60% of this is healthy granulation at the surface however she has a significant divot distally. This has adherent debris in it. I been using debridement and silver collagen to try and get this area to fill-in although I do not think we have made much progress this  week 9/24; the patient's wound on the left medial ankle looks a lot better. The deeper divot area distally still requires debridement but this is cleaning up quite nicely we have been using silver collagen. The patient is complaining of swelling in her foot and is worried that that is contributing to the nonhealing of the ankle wound. She is also complaining of numbness in her anterior toes 10/4; left medial ankle. The small area distally still has a divot with necrotic material that I have been debriding away. This has an undermining area. She is approved for Apligraf. She saw Dr. [name] her surgeon on 10/1. I think he declared himself is satisfied with the condition of things. Still nonweightbearing till the end of the month. 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 Her surgical wound is well closed. There is no evidence of infection 10/11; the patient arrived in clinic today with the expectation that we be able to put an Apligraf on this area after debridement however she arrives with a relatively large amount of green drainage on the dressing. The patient states that this started on Friday. She has not been systemically unwell. 10/19; culture I did last week showed both Enterococcus and Pseudomonas. I think this came in separate parts because  I stopped her ciprofloxacin I gave her and prescribed her linezolid however now looking at the final culture result this is Pseudomonas which is resistant to quinolones. She has not yet picked up the linezolid apparently phone issues. We are also trying to get a topical antibiotic out of [place] in [place] they can be applied by home health. She is still having green drainage 10/16; the patient has her topical antibiotic from [facility] in [place]. This is a compounded gel with vancomycin and ciprofloxacin and gentamicin. We are applying this on the wound bed with silver alginate over the top with Unna boot  wraps. She arrives in clinic today with a lot less ominous looking drainage although she is only use this topical preparation once the second time today. She sees Dr. [name] her surgeon on Friday she has home health changing the dressing 11/2; still using her compounded topical antibiotic under silver alginate. Surface is cleaning up there is less drainage. We had an Apligraf for her today and I elected to apply it. A light coating of her antibiotic 04/25/2020 upon evaluation today patient appears to be doing well with regard to her ankle ulcer. There is a little bit of slough noted on the surface of the wound I am can have to perform sharp debridement to clear this away today. With that being said other than that fact overall I feel like she is making progress and we do see some new epithelial growth. There is also some improvement in the depth of the wound and that distal portion. There is little bit of slough there as well. 12/7; 2-week follow-up. Apligraf #3. Dimensions are smaller. Closing in especially inferiorly. Still some surface debris. Still using the [facility] topical antibiotic but I told her that I don't think this needs to be renewed 12/21; 2-week follow-up. Apligraf #4 dimensions are smaller. Nice improvement 06/05/2020; 2-week follow-up. The patient's wound on the left medial ankle looks really excellent. Nice granulation. Advancing epithelialization no undermining no evidence of infection. We would have to reapply for another Apligraf but with the condition of this wound I did not feel strongly about it. We used Hydrofera Blue under the same degree of compression. She follows up with Dr. [name] her surgeon a week Friday 06/13/2020 upon evaluation today patient appears to be doing excellent in regard to her wound. She has been tolerating the dressing changes without complication. Fortunately there is no signs of active infection at this time. No fevers, chills, nausea, vomiting, or  diarrhea. She was using Hydrofera Blue last week. 06/20/2020 06/20/2020 on evaluation today patient actually appears to be doing excellent in regard to her wound. This is measuring better and looking much better as well. She has been using the collagen that seems to be doing better for her as well even though the [facility] was and is not sticking or feeling as rough on her wound. She did see Dr. [name] on Friday he is very pleased he also stated none of the hardware has shifted. That is great news 1/27; the patient has a small clean wound all that is remaining. I agree that this is too small to really consider an Apligraf. Under illumination the surface is looking quite good. We have been using collagen although the dimensions are not any better this week 2/2; the patient has a small clean wound on the left medial ankle. Although this left of her substantial original areas. Measurements are smaller. We have been using polymen Ag under an  Unna boot. 2/10; small area on the left medial ankle. This looks clean nothing to debride however dimensions are about the same we have been using polymen I think now for 2 weeks 2/17; not much change in surface area. We have been using polymen Ag without any improvement. 3/17; 1 month follow-up. The patient has been using endoform without any improvement in fact I think this looks worse with more depth and more expansion 3/24; no improvement. Perhaps less debris on the surface. We have been using Sorbact for 1 week 4/4; wound measures larger. She has edema in her leg and her foot which she tells as her wrap came down. We have been using Unna boots. Sorbact of the wound. She has been approved for Apligraf 09/12/2020 upon evaluation today patient appears to be doing well with regard to her wound. We did get the Apligraf reapproved which is great news we have that available for application today. Fortunately there is no signs of infection and overall the patient  seems to be doing great. The wound bed is nice and clean. 4/27; patient presents for her second application of Apligraf. She states over the past week she has been on her feet more often due to being outside in her garden. She has noted more swelling to her foot as a result. She denies increased warmth, pain or erythema to the wound site. 10/10/2020 upon evaluation today patient appears to be doing well with regard to her wound which does not appear to be quite as irritated as last week from what I am hearing. With that being said unfortunately she is having issues with some erythema and warmth to touch as well as an increase size. I do believe this likely is infected. 10/17/2020 upon evaluation today patient appears to be doing excellent in regard to her wound this is significantly improved as compared to last week. Fortunately I think that the infection is much better controlled at this point. She did have evidence of both Enterococcus as well as Staphylococcus noted on culture. Enterococcus really would not be helped significantly by the Cipro but the wound is doing so much better I am under the assumption that the Staphylococcus is probably the main organism that is causing the current infection. Nonetheless I think that she is doing excellent as far as that is concerned and I am very pleased in that regard. I would therefore recommend she continue with the Cipro. 10/31/2020 upon evaluation today patient appears to be doing well with regard to her wound. She has been tolerating the dressing changes without complication. Fortunately there is no signs of active infection and overall I am extremely pleased with where things stand today. No fevers, chills, nausea, vomiting, or diarrhea. With that being said she does have some green drainage coming from the wound and although it looks okay I am a little concerned about the possibility of a continuing infection. Specifically with Pseudomonas. For that reason  I will go ahead and send in a prescription for Cipro for her to be continued. 11/14/2020 upon evaluation today patient appears to be doing very well currently in regard to her wound on her leg. She has been tolerating the dressing changes without complication. Fortunately I feel like the infection is finally under good control here. Unfortunately we do not have the Apligraf for application today although we can definitely order to have it in place for next week. That will be her fifth and final of the current series. Nonetheless I feel like  her wound is really doing quite well which is great news. 11/21/2020 upon evaluation today patient appears to be doing well with regard to her wound on the medial ankle. Fortunately I think the infection is under control and I do believe we can go ahead and reapply the Apligraf today. She is in agreement with that plan. There does not appear to be any signs of active infection at this time which is great news. No fevers, chills, nausea, vomiting, or diarrhea. 12/05/2020 upon evaluation today patient's wound bed actually showed signs of good granulation epithelization at this point. There does not appear to be any signs of infection which is great news and overall very pleased with where things stand. Overall the patient seems to be doing fairly well in my opinion with regard to her wound although I do believe she continues to build up a lot of biofilm I think she could benefit from using PuraPly at this point. 12/12/2020 upon evaluation today patient's wound actually appears to be doing decently well today. The Unna boot has not been quite as well-tolerated so that more uncomfortable for her and even causing some pressure over the plate on the lateral portion of her foot which is 90 where the wound is. There did not appear to be any significant deep tissue injury with that there may be a minimal change in the skin noted I think that we may want to go back to the Coflex  2 layer which is a little bit easier on her skin it seems. 12/19/2020 upon evaluation today patient actually seems to be making great progress with the PuraPly currently. She in fact seems to be much better as far as the overall appearance of the wound bed is concerned I am very happy in this regard. I do not see any signs of of infection which is great news as well. No fevers, chills, nausea, vomiting, or diarrhea. 12/26/2020 upon evaluation today patient appears to actually be doing better in regard to her wound on the left medial ankle region. The surface of the wound is actually doing significantly better which is great news. There does not appear to be any signs of infection which is also great news and in general I am extremely pleased with where we stand today. 01/02/2021 upon evaluation today patient appears to be doing well with regard to her wound. In fact this is showing signs of excellent improvement and very pleased with where things stand. In fact the last 3 appointments have all shown signs of this getting smaller which is excellent news. I have not even had to perform any debridement and today is no exception. Overall I feel like this is dramatically improved compared to previous. T oday is PuraPly application #4. [id number] upon evaluation today patient appears to be doing excellent in regard to her wound this is continue to show signs of improvement and overall I am extremely pleased with where we stand today. She is actually here for PuraPly application #5. Every time we have applied this she is noted definite improvement on measurements. 01/23/2021 upon evaluation today patient is actually making good progress in regard to her wound. This was actually on just a little bit longer this time compared to previous due to the fact that she did have to go out of town. She is actually here for PuraPly application #6. We have definitely been seeing improvements in the overall quality of the  tissue on the surface of the wound which is awesome news.  In general I think that the patient seems to be continuing to make great progress here. 01/30/2021 upon evaluation today patient's wound is actually doing excellent. There is really not any significant biofilm buildup which is great news and overall I am extremely pleased with where things stand today. There does not appear to be any signs of active infection. No fevers, chills, nausea, vomiting, or diarrhea. 02/06/2021 upon evaluation today patient's wound is actually showing signs of excellent improvement which is great news. We continue to see the benefit of the PuraPly this is doing a great job the wound seems not really irritated whatsoever and is showing signs of good granulation at this point. Overall I am extremely happy with what we are seeing. The patient likewise is happy to hear all of this as well. 02/13/2021 upon evaluation today patient appears to be doing well with regard to her wound. This again is measuring smaller today and I am very pleased in this regard. Fortunately there does not appear to be any signs of active infection at this time which is good news from a systemic standpoint. Locally there is still some significant drainage which she does have is concerned about infection locally. No fevers, chills, nausea, vomiting, or diarrhea. Objective Constitutional Well-nourished and well-hydrated in no acute distress. Vitals Time Taken: 10:03 AM, Height: 68 in, Temperature: 97.9 F, Pulse: 56 bpm, Respiratory Rate: 20 breaths/min, Blood Pressure: 119/71 mmHg. Respiratory normal breathing without difficulty. Psychiatric this patient is able to make decisions and demonstrates good insight into disease process. Alert and Oriented x 3. pleasant and cooperative. General Notes: Upon inspection patient's wound bed showed signs of good granulation and epithelization for the most part. There was some need for sharp debridement to  remove some of the slough and biofilm from the surface of the wound. Patient did tolerate the dressing changes without complication and postdebridement the wound bed appears to be doing significantly better which is great news. We did do a pre and post fluorescence imaging with the MolecuLight device. The patient did show evidence of blush/red fluorescence noted. Some of this was even underneath intact skin in the 6:00 location again secondary to this I am concerned about infection count of being somewhat deep-seated I would recommend that we go ahead and see about initiating oral antibiotic therapy at this point. Integumentary (Hair, Skin) Wound #15 status is Open. Original cause of wound was Gradually Appeared. The date acquired was: 12/30/2019. The wound has been in treatment 56 weeks. The wound is located on the Left,Medial Malleolus. The wound measures 1.9cm length x 1.6cm width x 0.1cm depth; 2.388cm^2 area and 0.239cm^3 volume. There is Fat Layer (Subcutaneous Tissue) exposed. There is no tunneling or undermining noted. There is a medium amount of serosanguineous drainage noted. The wound margin is distinct with the outline attached to the wound base. There is medium (34-66%) red granulation within the wound bed. There is a medium (34-66%) amount of necrotic tissue within the wound bed including Adherent Slough. 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 There was a Excisional Skin/Subcutaneous Tissue Debridement with a total area of 3.04 sq cm performed by Joaquim Lai III,  [place], [place].  With the following instrument(s): Curette to remove Viable and Non-Viable tissue/material. Material removed includes Subcutaneous Tissue and Slough and after achieving pain control using Other (benzocaine 20% spray). No specimens were taken. A time out was conducted at 11:00, prior to the start of the procedure. A Minimum amount of bleeding was controlled with Pressure. The procedure was tolerated well with a pain level of 4 throughout and a pain level of 3 following the procedure. Post Debridement Measurements: 1.9cm length x 1.6cm width x 0.1cm depth; 0.239cm^3 volume. Character of Wound/Ulcer Post Debridement is improved. Severity of Tissue Post Debridement is: Fat layer exposed. Post procedure Diagnosis Wound #15: Same as Pre-Procedure Pre-procedure diagnosis of Wound #15 is a Venous Leg Ulcer located on the Left,Medial Malleolus. A skin graft procedure using a bioengineered skin substitute/cellular or tissue based product was performed by [name], PA with the following instrument(s): Forceps and Scissors. Puraply AM was applied and was not secured. 12 sq cm of product was utilized and 0 sq cm was wasted. Post Application, adaptic was applied. A Time Out was conducted at 11:11, prior to the start of the procedure. The procedure was tolerated well with a pain level of 0 throughout and a pain level of 0 following the procedure. Post procedure Diagnosis Wound #15: Same as Pre-Procedure . Pre-procedure diagnosis of Wound #15 is a Venous Leg Ulcer located on the Left,Medial Malleolus . There was a Haematologist Compression Therapy Procedure by Baruch Gouty, RN. Post procedure Diagnosis Wound #15: Same as Pre-Procedure Plan Follow-up Appointments: Return Appointment in 1 week. - with Margarita Grizzle Cellular or Tissue Based Products: Cellular or Tissue Based Product Type: - PuraPly: #6, 01/30/21 PuraPly #7, 02/06/21 purPLY #8, 02/13/21 Puraply AM #9 Cellular or Tissue Based Product applied to wound bed,  secured with steri-strips, cover with Adaptic or Mepitel. (DO NOT REMOVE). - no steristrips Bathing/ Shower/ Hygiene: May shower with protection but do not get wound dressing(s) wet. Edema Control - Lymphedema / SCD / Other: Elevate legs to the level of the heart or above for 30 minutes daily and/or when sitting, a frequency of: - throughout the day Avoid standing for long periods of time. Exercise regularly Compression stocking or Garment 20-30 mm/Hg pressure to: - right leg daily Additional Orders / Instructions: Follow Nutritious Diet The following medication(s) was prescribed: Bactrim DS oral 800 mg-160 mg tablet 1 1 tablet oral taken 2 times per day for 14 days starting 02/13/2021 WOUND #15: - Malleolus Wound Laterality: Left, Medial Cleanser: Soap and Water 1 x Per Week/30 Days Discharge Instructions: May shower and wash wound with dial antibacterial soap and water prior to dressing change. Peri-Wound Care: Triamcinolone 15 (g) 1 x Per Week/30 Days Discharge Instructions: Use triamcinolone 15 (g) as directed Peri-Wound Care: Sween Lotion (Moisturizing lotion) 1 x Per Week/30 Days Discharge Instructions: Apply moisturizing lotion as directed Prim Dressing: PuraPly AM 1 x Per Week/30 Days ary Secondary Dressing: Woven Gauze Sponge, Non-Sterile 4x4 in 1 x Per Week/30 Days Discharge Instructions: to bolster skin sub Secondary Dressing: ABD Pad, 5x9 1 x Per Week/30 Days Discharge Instructions: Apply over primary dressing and lateral ankle Secondary Dressing: Optifoam Non-Adhesive Dressing, 4x4 in 1 x Per Week/30 Days Discharge Instructions: Apply over primary dressing pad lateral foot with foam donut Secondary Dressing: ADAPTIC TOUCH 3x4.25 in 1 x Per Week/30 Days Discharge Instructions: Apply over skin sub Com pression Wrap: CoFlex TLC XL 2-layer Compression System 4x7 (in/yd) 1 x Per Week/30 Days Discharge Instructions:  Apply CoFlex 2-layer compression as directed. (alt for 4  layer) 1. Upon inspection patient's wound bed actually showed some signs of red fluorescence imaging for that reason I am going go ahead and initiate a oral antibiotic as this was underneath the skin surface in the 6:00 location and again this is not something that I could target with specific debridement. Nonetheless I think it is something that we need to try to alleviate as much as possible and again I think that will help with the healing as well. 2. With that being said I do think that continuing with the PuraPly would be ideal especially since this is antimicrobial in and of itself and again can be helpful in cutting the bacterial bioburden quite a bit. 3. I am also going to recommend that we continue with the compression wrapping which I think is also doing a great job for the patient. We will see patient back for reevaluation in 1 week here in the clinic. If anything worsens or changes patient will contact our office for additional recommendations. MolecuLight DX: 1st Scanned Wound The following wound was scanned with MolecuLight DX): Left medial ankle Fluorescence bacterial imaging was medically necessary today due to Initial Evaluation of the wound with MolecuLightDX to determine baseline (Indication): bacterial bioburden level MolecuLight Results Red Colors, Green Colors The indicated colors were noted in the following area(s). In the periphery of the wound As a result of todays scan, the following treatment plans were put in place. Patient started on oral antibiotics continue PuraPly MolecuLight Procedure The MolecularLight DX device was cleaned with a disinfectant wipe prior to use., The correct patient profile was confirmed and correct wound was verified., Range finder sensor used to ensure appropriate distance selected The following was completed: between imaging unit and wound bed, Room lights were turned off and the ambient light sensor was checked., Blue circle appeared around  the lightbulb., The fluorescence icon was selected. Screen was tapped to enhance focus and the image was captured. Additional drapes were used to ensure adequate darknesso No Potential ICD-10 Codes ICD-10 49.9 Bacterial infection unspecified (Red Color) Additional Scanned Wounds Did you scan any additional Woundso No Electronic Signature(s) Signed: 02/13/2021 11:25:19 AM By: [name] PA-C Entered By: [name] on 02/13/2021 11:25:19 -------------------------------------------------------------------------------- SuperBill Details Patient Name: Date of Service: [name], [name] 02/13/2021 Medical Record Number: [id number] Patient Account Number: [id number] Date of Birth/Sex: Treating RN: 06/24/1955 (65 y.o. [name] Provider: [name] Other Clinician: Referring Provider: Treating Provider/Extender: [name] in Treatment: 52 Diagnosis Coding ICD-10 Codes Code Description [id number] Chronic venous hypertension (idiopathic) with ulcer and inflammation of left lower extremity L97.828 Non-pressure chronic ulcer of other part of left lower leg with other specified severity L97.328 Non-pressure chronic ulcer of left ankle with other specified severity Facility Procedures CPT4 Code: [id number] Description: [id number] PuraPly AM 3X4 (12sq. cm) enter 12qty Modifier: Quantity: 12 CPT4 Code: [id number] Description: [id number] - SKIN SUB GRAFT TRNK/ARM/LEG ICD-10 Diagnosis Description L97.828 Non-pressure chronic ulcer of other part of left lower leg with other specified se Modifier: verity Quantity: 1 CPT4 Code: [id number] Description: [id number] NONCNTACT RT FLORO WND 1ST STE ICD-10 Diagnosis Description L97.828 Non-pressure chronic ulcer of other part of left lower leg with other specified se Modifier: 59 verity Quantity: 1 Physician Procedures : CPT4 Code Description Modifier [id number] - WC PHYS SKIN SUB GRAFT TRNK/ARM/LEG ICD-10  Diagnosis Description L97.828 Non-pressure chronic ulcer of other part  of left lower leg with other specified severity Quantity: 1 Electronic Signature(s) Signed: 02/14/2021 5:56:16 PM By: Baruch Gouty RN, BSN Signed: 03/01/2021 4:42:47 PM By: [name] PA-C Previous Signature: 02/13/2021 11:25:31 AM Version By: [name] PA-C Entered By: Baruch Gouty on 02/14/2021 17:30:51

## 2021-02-13 NOTE — Progress Notes (Signed)
AUDREY, THULL ([id number]) Visit Report for 02/13/2021 Arrival Information Details Patient Name: Date of Service: [facility], [name] 02/13/2021 10:00 A M Medical Record Number: [id number] Patient Account Number: [id number] Date of Birth/Sex: Treating RN: 1955/06/21 (65 y.o. [name], Meta.Reding Primary Care Zelma Snead: [name] Other Clinician: Referring Deavin Forst: Treating Kyiesha Millward/Extender: [name] in Treatment: 14 Visit Information History Since Last Visit Added or deleted any medications: No Patient Arrived: Cane Any new allergies or adverse reactions: No Arrival Time: 10:03 Had a fall or experienced change in No Accompanied By: self activities of daily living that may affect Transfer Assistance: None risk of falls: Patient Identification Verified: Yes Signs or symptoms of abuse/neglect since last visito No Secondary Verification Process Completed: Yes Hospitalized since last visit: No Patient Requires Transmission-Based Precautions: No Implantable device outside of the clinic excluding No Patient Has Alerts: Yes cellular tissue based products placed in the center Patient Alerts: L ABI =1.12, TBI = .92 since last visit: R ABI= 1.02, TBI= .58 Has Dressing in Place as Prescribed: Yes Has Compression in Place as Prescribed: Yes Pain Present Now: No Electronic Signature(s) Signed: 02/13/2021 6:11:22 PM By: Deon Pilling RN, BSN Entered By: Deon Pilling on 02/13/2021 10:09:05 -------------------------------------------------------------------------------- Compression Therapy Details Patient Name: Date of Service: [name], [name] 02/13/2021 10:00 A M Medical Record Number: [id number] Patient Account Number: [id number] Date of Birth/Sex: Treating RN: [birth year] (65 y.o. [name] Hendy Brindle: [name] Other Clinician: Referring Dela Sweeny: Treating Ravon Mcilhenny/Extender: [name] in Treatment:  56 Compression Therapy Performed for Wound Assessment: Wound #15 Left,Medial Malleolus Performed By: Clinician Baruch Gouty, RN Compression Type: Double Layer Post Procedure Diagnosis Same as Pre-procedure Electronic Signature(s) Signed: 02/13/2021 6:35:12 PM By: Baruch Gouty RN, BSN Entered By: Baruch Gouty on 02/13/2021 11:25:16 -------------------------------------------------------------------------------- Encounter Discharge Information Details Patient Name: Date of Service: [name], [name] 02/13/2021 10:00 [place] Record Number: [id number] Patient Account Number: [id number] Date of Birth/Sex: Treating RN: Oct 22, 1955 (65 y.o. [name] Primary Care Elim Peale: [name] Other Clinician: Referring Gena Laski: Treating Mailey Landstrom/Extender: [name] in Treatment: 27 Encounter Discharge Information Items Post Procedure Vitals Discharge Condition: Stable Temperature (F): 97.9 Ambulatory Status: Cane Pulse (bpm): 56 Discharge Destination: Home Respiratory Rate (breaths/min): 20 Transportation: Private Auto Blood Pressure (mmHg): 119/71 Accompanied By: self Schedule Follow-up Appointment: Yes Clinical Summary of Care: Electronic Signature(s) Signed: 02/13/2021 6:11:22 PM By: Deon Pilling RN, BSN Entered By: Deon Pilling on 02/13/2021 18:06:55 -------------------------------------------------------------------------------- Lower Extremity Assessment Details Patient Name: Date of Service: [facility] [name], [name] 02/13/2021 10:00 [place] Record Number: [id number] Patient Account Number: [id number] Date of Birth/Sex: Treating RN: 04/22/1956 (65 y.o. [name] Primary Care Brenon Antosh: [name] Other Clinician: Referring Anett Ranker: Treating Noble Cicalese/Extender: [name] Weeks in Treatment: 56 Edema Assessment Assessed: [Left: Yes] [Right: No] Edema: [Left: Ye] [Right: s] Calf Left:  Right: Point of Measurement: 31 cm From Medial Instep 26.5 cm Ankle Left: Right: Point of Measurement: 11 cm From Medial Instep 20.5 cm Vascular Assessment Pulses: Dorsalis Pedis Palpable: [Left:Yes] Electronic Signature(s) Signed: 02/13/2021 6:11:22 PM By: Deon Pilling RN, BSN Entered By: Deon Pilling on 02/13/2021 10:11:55 -------------------------------------------------------------------------------- [place] Details Patient Name: Date of Service: [name], [name] 02/13/2021 10:00 [place] Record Number: [id number] Patient Account Number: [id number] Date of Birth/Sex: Treating RN: [birth year] (65 y.o. F) Baruch Gouty  Primary Care Daralyn Bert: [name] Other Clinician: Referring Lazarus Sudbury: Treating Marck Mcclenny/Extender: [name] in Treatment: 25 Multidisciplinary Care Plan reviewed with physician Active Inactive Venous Leg Ulcer Nursing Diagnoses: Actual venous Insuffiency (use after diagnosis is confirmed) Knowledge deficit related to disease process and management Goals: Patient will maintain optimal edema control Date Initiated: 06/20/2020 Target Resolution Date: 02/27/2021 Goal Status: Active Interventions: Assess peripheral edema status every visit. Compression as ordered Treatment Activities: Therapeutic compression applied : 06/20/2020 Notes: 01/30/21: Edema control ongoing, using CoFlex wrap. Wound/Skin Impairment Nursing Diagnoses: Impaired tissue integrity Knowledge deficit related to ulceration/compromised skin integrity Goals: Patient/caregiver will verbalize understanding of skin care regimen Date Initiated: 01/17/2020 Target Resolution Date: 02/27/2021 Goal Status: Active Ulcer/skin breakdown will have a volume reduction of 30% by week 4 Date Initiated: 01/17/2020 Date Inactivated: 03/12/2020 Target Resolution Date: 03/09/2020 Goal Status: Met Interventions: Assess patient/caregiver ability to  obtain necessary supplies Assess patient/caregiver ability to perform ulcer/skin care regimen upon admission and as needed Assess ulceration(s) every visit Provide education on ulcer and skin care Notes: 01/30/21: Wound care regimen continues, PuraPly skin sub being used. Electronic Signature(s) Signed: 02/13/2021 6:35:12 PM By: Baruch Gouty RN, BSN Entered By: Baruch Gouty on 02/13/2021 11:00:18 -------------------------------------------------------------------------------- Pain Assessment Details Patient Name: Date of Service: [name], [name] 02/13/2021 10:00 A M Medical Record Number: [id number] Patient Account Number: [id number] Date of Birth/Sex: Treating RN: 07-24-1955 (65 y.o. [name] Primary Care Tashauna Caisse: [name] Other Clinician: Referring Asante Ritacco: Treating Clester Chlebowski/Extender: [name] in Treatment: 68 Active Problems Location of Pain Severity and Description of Pain Patient Has Paino No Site Locations Rate the pain. Current Pain Level: 0 Pain Management and Medication Current Pain Management: Medication: No Cold Application: No Rest: No Massage: No Activity: No T.E.N.S.: No Heat Application: No Leg drop or elevation: No Is the Current Pain Management Adequate: Adequate How does your wound impact your activities of daily livingo Sleep: No Bathing: No Appetite: No Relationship With Others: No Bladder Continence: No Emotions: No Bowel Continence: No Work: No Toileting: No Drive: No Dressing: No Hobbies: No Electronic Signature(s) Signed: 02/13/2021 6:11:22 PM By: Deon Pilling RN, BSN Entered By: Deon Pilling on 02/13/2021 10:09:30 -------------------------------------------------------------------------------- Patient/Caregiver Education Details Patient Name: Date of Service: [name], [name] 9/14/2022andnbsp10:00 A M Medical Record Number: [id number] Patient Account Number: [id number] Date of  Birth/Gender: Treating RN: 1955-10-14 (65 y.o. [name] Physician: [name] Other Clinician: Referring Physician: Treating Physician/Extender: [name] in Treatment: 32 Education Assessment Education Provided To: Patient Education Topics Provided Venous: Methods: Explain/Verbal Responses: Reinforcements needed, State content correctly Wound/Skin Impairment: Methods: Explain/Verbal Responses: Reinforcements needed, State content correctly Electronic Signature(s) Signed: 02/13/2021 6:35:12 PM By: Baruch Gouty RN, BSN Entered By: Baruch Gouty on 02/13/2021 11:00:45 -------------------------------------------------------------------------------- Wound Assessment Details Patient Name: Date of Service: [name], [name] 02/13/2021 10:00 A M Medical Record Number: [id number] Patient Account Number: [id number] Date of Birth/Sex: Treating RN: [birth year] (65 y.o. [name], Meta.Reding Primary Care Tynlee Bayle: [name] Other Clinician: Referring Athziri Freundlich: Treating Darryn Kydd/Extender: [name] in Treatment: 84 Wound Status Wound Number: 15 Primary Venous Leg Ulcer Etiology: Wound Location: Left, Medial Malleolus Wound Status: Open Wounding Event: Gradually Appeared Comorbid Peripheral Venous Disease, Osteoarthritis, Confinement Date Acquired: 12/30/2019 History: Anxiety Weeks Of Treatment: 56 Clustered Wound: No Photos Wound Measurements Length: (cm) 1.9 Width: (cm) 1.6 Depth: (cm) 0.1 Area: (cm) 2.388  Volume: (cm) 0.239 Wound Description Classification: Full Thickness With Exposed Support Struct Wound Margin: Distinct, outline attached Exudate Amount: Medium Exudate Type: Serosanguineous Exudate Color: red, brown ures Foul Odor After Cleansing: No Slough/Fibrino Yes % Reduction in Area: 79.5% % Reduction in Volume: 97.4% Epithelialization: Small (1-33%) Tunneling:  No Undermining: No Wound Bed Granulation Amount: Medium (34-66%) Exposed Structure Granulation Quality: Red Fascia Exposed: No Necrotic Amount: Medium (34-66%) Fat Layer (Subcutaneous Tissue) Exposed: Yes Necrotic Quality: Adherent Slough Tendon Exposed: No Muscle Exposed: No Joint Exposed: No Bone Exposed: No Treatment Notes Wound #15 (Malleolus) Wound Laterality: Left, Medial Cleanser Soap and Water Discharge Instruction: May shower and wash wound with dial antibacterial soap and water prior to dressing change. Peri-Wound Care Triamcinolone 15 (g) Discharge Instruction: Use triamcinolone 15 (g) as directed Sween Lotion (Moisturizing lotion) Discharge Instruction: Apply moisturizing lotion as directed Topical Primary Dressing PuraPly AM Secondary Dressing Woven Gauze Sponge, Non-Sterile 4x4 in Discharge Instruction: to bolster skin sub ABD Pad, 5x9 Discharge Instruction: Apply over primary dressing and lateral ankle Optifoam Non-Adhesive Dressing, 4x4 in Discharge Instruction: Apply over primary dressing pad lateral foot with foam donut ADAPTIC TOUCH 3x4.25 in Discharge Instruction: Apply over skin sub Secured With Compression Wrap CoFlex TLC XL 2-layer Compression System 4x7 (in/yd) Discharge Instruction: Apply CoFlex 2-layer compression as directed. (alt for 4 layer) Compression Stockings Add-Ons Electronic Signature(s) Signed: 02/13/2021 6:11:22 PM By: Deon Pilling RN, BSN Signed: 02/13/2021 6:35:12 PM By: Baruch Gouty RN, BSN Entered By: Baruch Gouty on 02/13/2021 11:22:56 -------------------------------------------------------------------------------- Vitals Details Patient Name: Date of Service: [name], [name] 02/13/2021 10:00 A M Medical Record Number: [id number] Patient Account Number: [id number] Date of Birth/Sex: Treating RN: [birth year] (65 y.o. [name], Meta.Reding Primary Care Nashid Pellum: [name] Other Clinician: Referring  Fusae Florio: Treating Kaelynne Christley/Extender: [name] in Treatment: 10 Vital Signs Time Taken: 10:03 Temperature (F): 97.9 Height (in): 68 Pulse (bpm): 56 Respiratory Rate (breaths/min): 20 Blood Pressure (mmHg): 119/71 Reference Range: 80 - 120 mg / dl Electronic Signature(s) Signed: 02/13/2021 6:11:22 PM By: Deon Pilling RN, BSN Entered By: Deon Pilling on 02/13/2021 10:09:20

## 2021-02-17 IMAGING — MR MR CERVICAL SPINE W/O CM
5 series · 37 of 48 positions shown · non-contrast
Comparison: None.

CLINICAL DATA: Hyperreflexia in the upper and lower extremities.
Developing left ankle contracture.

EXAM:
MRI CERVICAL SPINE WITHOUT CONTRAST
TECHNIQUE: Multiplanar, multisequence MR imaging of the cervical spine was
performed. No intravenous contrast was administered.

[Series 5: T2 · sagittal · 3.0mm · 0.69mm/px · 6 of 15 slices shown (1 of 2)]
[im 1/15]
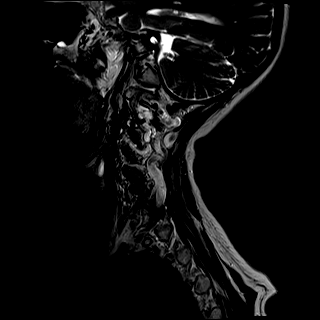
[im 3/15]
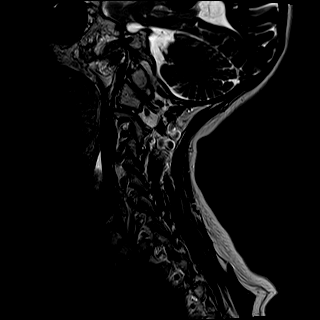
[im 6/15]
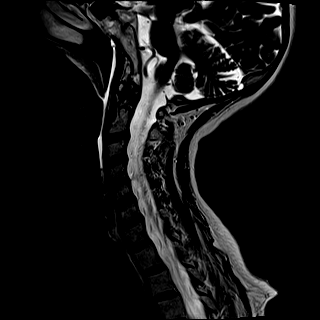
[im 9/15]
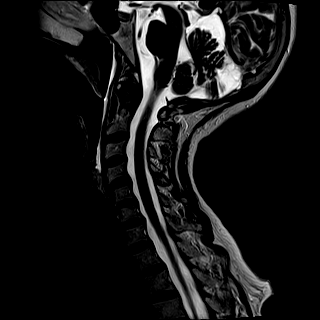
[im 12/15]
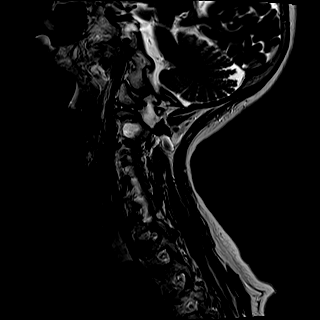
[im 15/15]
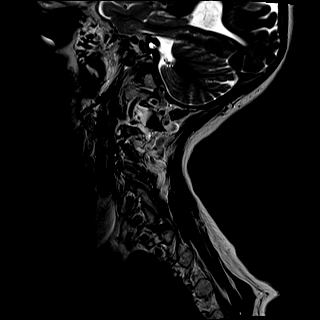

[Series 6: T1 · sagittal · 3.0mm · 0.69mm/px · 6 of 15 slices shown]
[im 1/15]
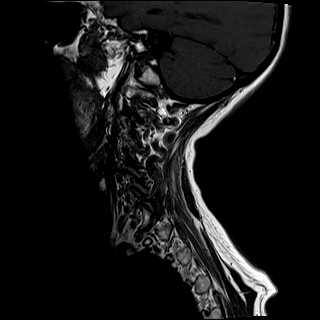
[im 3/15]
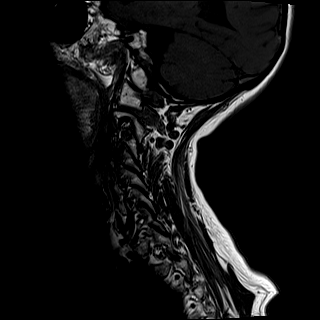
[im 6/15]
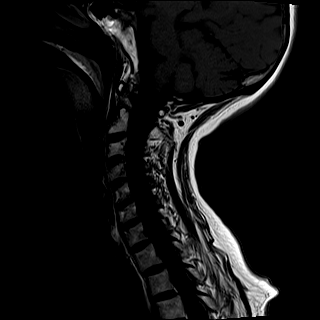
[im 9/15]
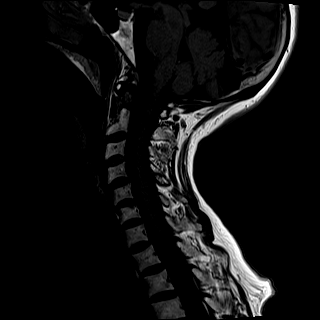
[im 12/15]
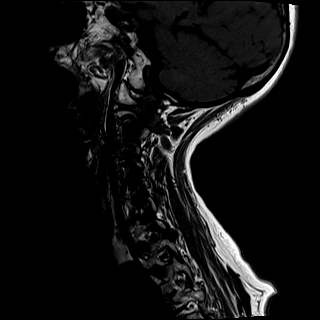
[im 15/15]
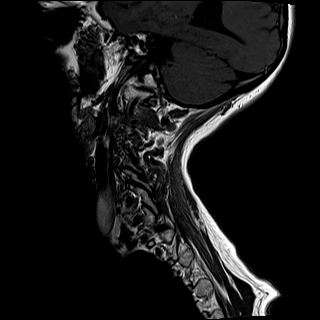

[Series 7: STIR · sagittal · 3.0mm · 0.86mm/px · 6 of 15 slices shown]
[im 1/15]
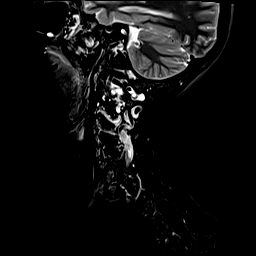
[im 3/15]
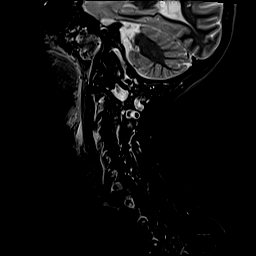
[im 6/15]
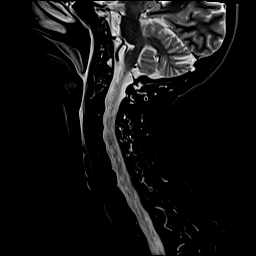
[im 9/15]
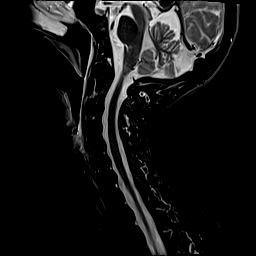
[im 12/15]
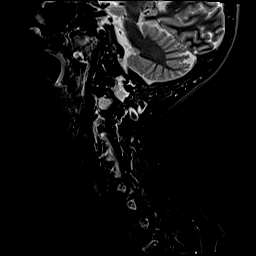
[im 15/15]
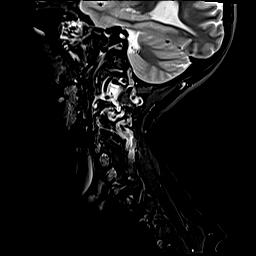

[Series 8: T2 · axial · 3.0mm · 0.66mm/px · z∈[-143,-20]mm · 11 of 40 slices shown (2 of 2)]
[im 1/40]
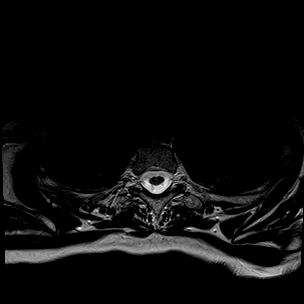
[im 3/40]
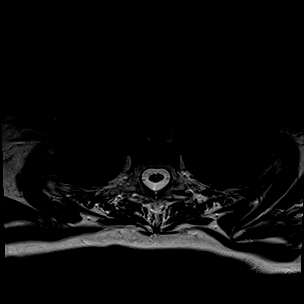
[im 6/40]
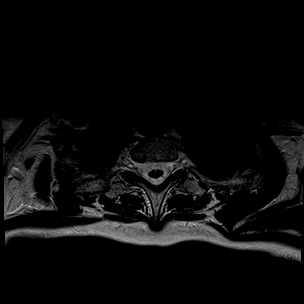
[im 9/40]
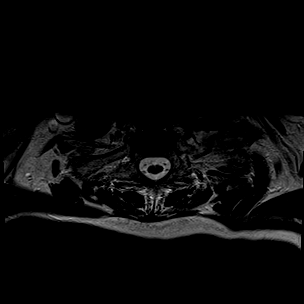
[im 12/40]
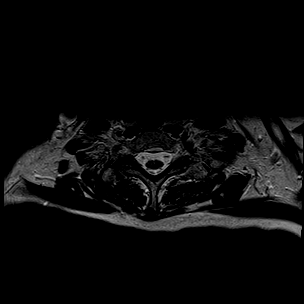
[im 17/40]
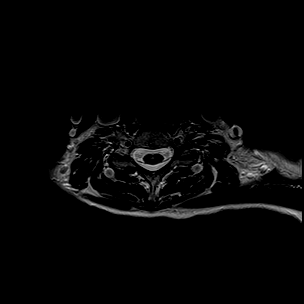
[im 20/40]
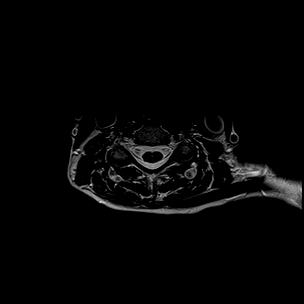
[im 23/40]
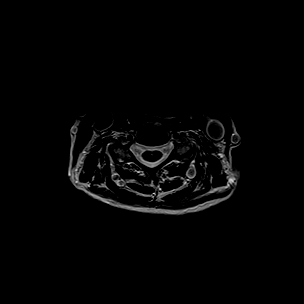
[im 28/40]
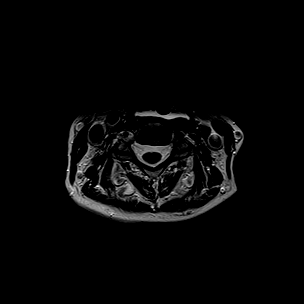
[im 34/40]
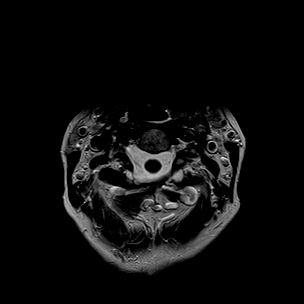
[im 40/40]
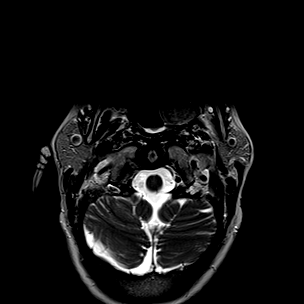

[Series 9: GRE · axial · 3.0mm · 0.39mm/px · z∈[-143,-20]mm · 8 of 40 slices shown]
[im 1/40]
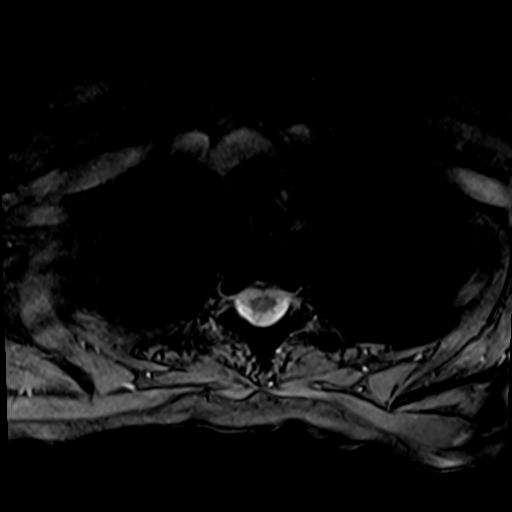
[im 6/40]
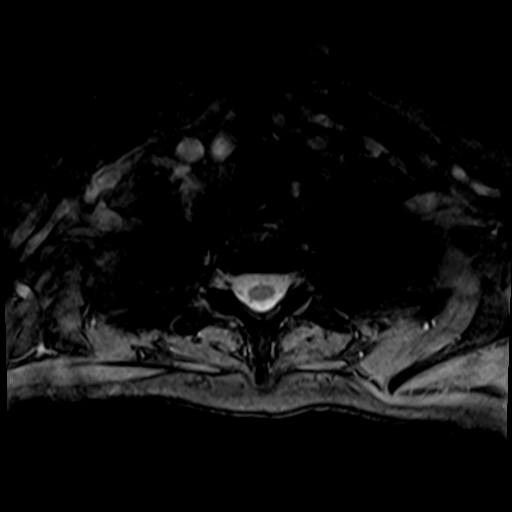
[im 12/40]
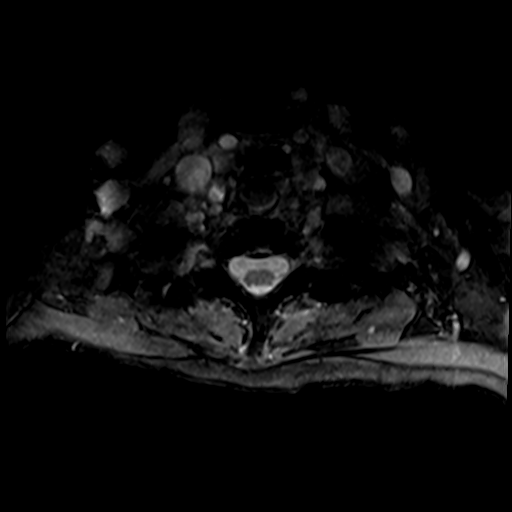
[im 17/40]
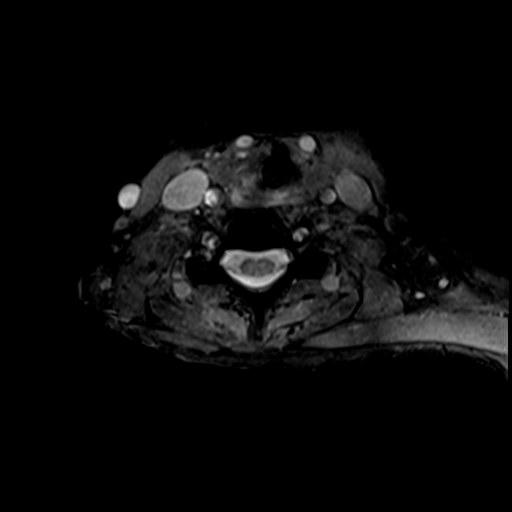
[im 23/40]
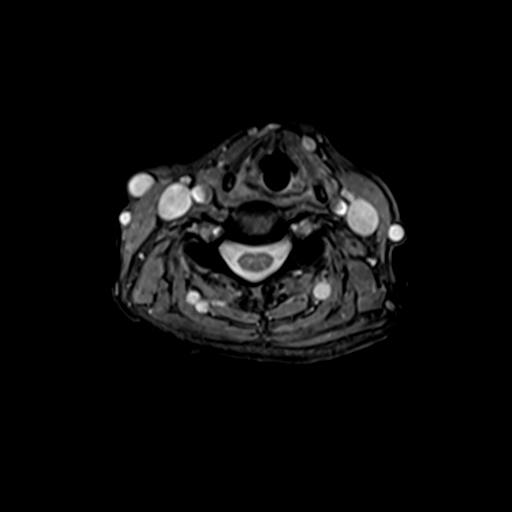
[im 28/40]
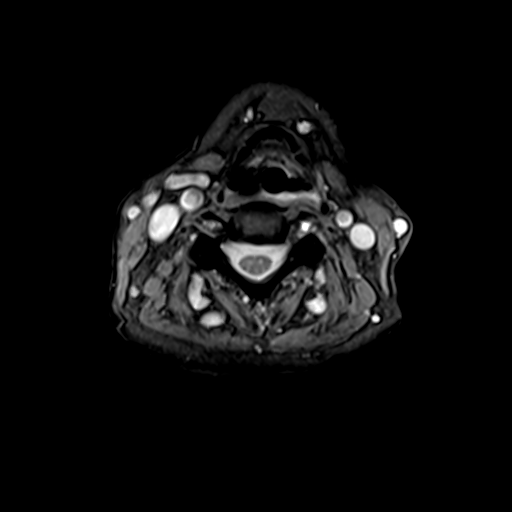
[im 34/40]
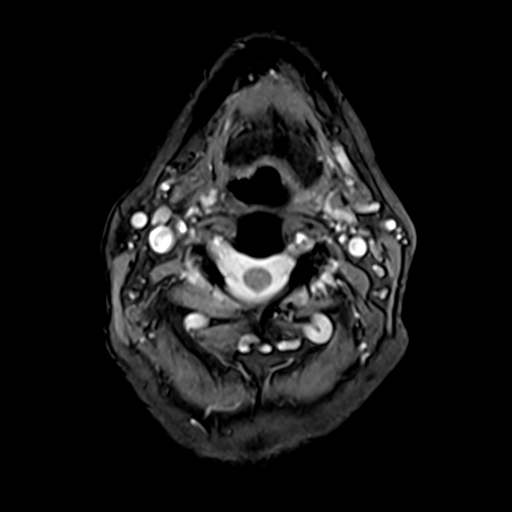
[im 40/40]
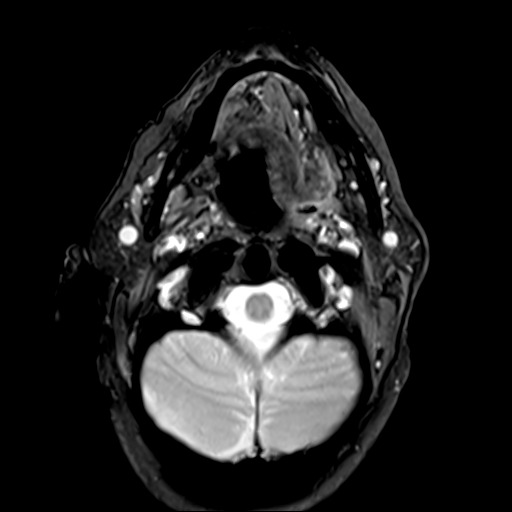

[37 of 48 positions shown; findings below may reference images not displayed]

FINDINGS: The study was performed under general anesthesia. The patient
requested that IV contrast not be administered unless it was deemed
absolutely necessary. It was decided not to give contrast after
reviewing the noncontrast images.

Alignment: 2 mm anterolisthesis of C4 on C5.

Vertebrae: No fracture, suspicious osseous lesion, or significant
marrow edema. Mild disc space narrowing at C5-6 and C6-7.

Cord: Normal signal and morphology.

Posterior Fossa, vertebral arteries, paraspinal tissues: Partially
visualized endotracheal tube. Small volume fluid/secretions in the
pharynx. Preserved vertebral artery flow voids. No significant
paraspinal soft tissue edema.

Disc levels:

Capacious spinal canal.

C2-3: Minimal disc bulging, minimal uncovertebral spurring, and mild
left greater than right facet arthrosis without stenosis.

C3-4: Minimal disc bulging, minimal uncovertebral spurring, and mild
facet arthrosis without stenosis.

C4-5: Minimal disc uncovering and mild facet arthrosis without
stenosis.

C5-6: Minimal disc bulging and mild facet arthrosis without
stenosis.

C6-7: Mild disc bulging and mild left facet arthrosis without
stenosis.

C7-T1: Moderate right facet arthrosis without disc herniation or
stenosis.
IMPRESSION: 1. Mild cervical disc and facet degeneration without stenosis.
2. Normal appearance of the cervical spinal cord.

## 2021-02-20 ENCOUNTER — Other Ambulatory Visit: Payer: Self-pay

## 2021-02-20 ENCOUNTER — Encounter (HOSPITAL_BASED_OUTPATIENT_CLINIC_OR_DEPARTMENT_OTHER): Payer: Medicare Other | Admitting: Physician Assistant

## 2021-02-20 DIAGNOSIS — I87332 Chronic venous hypertension (idiopathic) with ulcer and inflammation of left lower extremity: Secondary | ICD-10-CM | POA: Diagnosis not present

## 2021-02-20 NOTE — Progress Notes (Signed)
[name], [name] ([id number]) Visit Report for 02/20/2021 Arrival Information Details Patient Name: Date of Service: [facility], [name] 02/20/2021 10:00 [name] Record Number: [id number] Patient Account Number: [id number] Date of Birth/Sex: Treating RN: [birth year] (65 y.o. [name] Barnet Benavides: [name] Other Clinician: Referring Ameliarose Shark: Treating [name]/Extender: [name] in Treatment: 47 Visit Information History Since Last Visit Added or deleted any medications: No Patient Arrived: Cane Any new allergies or adverse reactions: No Arrival Time: 10:26 Had a fall or experienced change in No Accompanied By: self activities of daily living that may affect Transfer Assistance: None risk of falls: Patient Identification Verified: Yes Signs or symptoms of abuse/neglect since last visito No Secondary Verification Process Completed: Yes Hospitalized since last visit: No Patient Requires Transmission-Based Precautions: No Implantable device outside of the clinic excluding No Patient Has Alerts: Yes cellular tissue based products placed in the center Patient Alerts: L ABI =1.12, TBI = .92 since last visit: R ABI= 1.02, TBI= .58 Has Dressing in Place as Prescribed: Yes Pain Present Now: Yes Electronic Signature(s) Signed: 02/20/2021 10:42:28 AM By: [name] Entered By: [name] on 02/20/2021 10:27:28 -------------------------------------------------------------------------------- Compression Therapy Details Patient Name: Date of Service: [name] [name], [name] 02/20/2021 10:00 A M Medical Record Number: [id number] Patient Account Number: [id number] Date of Birth/Sex: Treating RN: Jan 24, 1956 (65 y.o. [name] Jyquan Kenley: [name] Other Clinician: Referring Wilmarie Sparlin: Treating [name]/Extender: [name] in Treatment: 47 Compression Therapy Performed for Wound  Assessment: Wound #15 Left,Medial Malleolus Performed By: Clinician [name] Pilling, RN Compression Type: Double Layer Post Procedure Diagnosis Same as Pre-procedure Electronic Signature(s) Signed: 02/20/2021 5:43:52 PM By: Baruch Gouty RN, BSN Entered By: Baruch Gouty on 02/20/2021 11:25:50 -------------------------------------------------------------------------------- Encounter Discharge Information Details Patient Name: Date of Service: [name] [name], [name] 02/20/2021 10:00 [name] Record Number: [id number] Patient Account Number: [id number] Date of Birth/Sex: Treating RN: [birth year] (66 y.o. [name] Primary [name] Ronnita Paz: [name] Other Clinician: Referring Donnelle Olmeda: Treating Triniti Gruetzmacher/Extender: [name] in Treatment: 26 Encounter Discharge Information Items Post Procedure Vitals Discharge Condition: Stable Temperature (F): 98.0 Ambulatory Status: Cane Pulse (bpm): 50 Discharge Destination: Home Respiratory Rate (breaths/min): 20 Transportation: Private Auto Blood Pressure (mmHg): 120/53 Schedule Follow-up Appointment: Yes Clinical Summary of [name]: Provided on 02/20/2021 Form Type Recipient Paper Patient Patient Electronic Signature(s) Signed: 02/20/2021 5:07:25 PM By: [name] Entered By: [name] on 02/20/2021 11:58:33 -------------------------------------------------------------------------------- Lower Extremity Assessment Details Patient Name: Date of Service: [name] [facility] [name], [name] 02/20/2021 10:00 A M Medical Record Number: [id number] Patient Account Number: [id number] Date of Birth/Sex: Treating RN: 1956/05/04 (65 y.o. [name] Primary [name] Wilver Tignor: [name] Other Clinician: Referring Alece Koppel: Treating Eddy Termine/Extender: [name] in Treatment: 34 Edema Assessment Assessed: [Left: Yes] [Right: No] Edema: [Left: Ye] [Right: s] Calf Left: Right: Point of  Measurement: 31 cm From Medial Instep 25.5 cm Ankle Left: Right: Point of Measurement: 11 cm From Medial Instep 21.5 cm Vascular Assessment Pulses: Dorsalis Pedis Palpable: [Left:Yes] Electronic Signature(s) Signed: 02/20/2021 5:58:14 PM By: [name] Pilling RN, BSN Entered By: [name] on 02/20/2021 10:58:47 -------------------------------------------------------------------------------- Multi-Disciplinary [name] Plan Details Patient Name: Date of Service: [name] [name], [name] 02/20/2021 10:00 [place] Record Number: [id number] Patient Account Number: [id number] Date of Birth/Sex: Treating RN: 10-18-1955 (65 y.o. [name] Samai Corea: [name]  Other Clinician: Referring Cody Albus: Treating Kimarion Chery/Extender: [name] in Treatment: 20 [place] reviewed with physician Active Inactive Venous Leg Ulcer Nursing Diagnoses: Actual venous Insuffiency (use after diagnosis is confirmed) Knowledge deficit related to disease process and management Goals: Patient will maintain optimal edema control Date Initiated: 06/20/2020 Target Resolution Date: 02/27/2021 Goal Status: Active Interventions: Assess peripheral edema status every visit. Compression as ordered Treatment Activities: Therapeutic compression applied : 06/20/2020 Notes: 01/30/21: Edema control ongoing, using CoFlex wrap. Wound/Skin Impairment Nursing Diagnoses: Impaired tissue integrity Knowledge deficit related to ulceration/compromised skin integrity Goals: Patient/caregiver will verbalize understanding of skin [name] regimen Date Initiated: 01/17/2020 Target Resolution Date: 02/27/2021 Goal Status: Active Ulcer/skin breakdown will have a volume reduction of 30% by week 4 Date Initiated: 01/17/2020 Date Inactivated: 03/12/2020 Target Resolution Date: 03/09/2020 Goal Status: Met Interventions: Assess patient/caregiver ability to obtain necessary  supplies Assess patient/caregiver ability to perform ulcer/skin [name] regimen upon admission and as needed Assess ulceration(s) every visit Provide education on ulcer and skin [name] Notes: 01/30/21: Wound [name] regimen continues, PuraPly skin sub being used. Electronic Signature(s) Signed: 02/20/2021 5:43:52 PM By: Baruch Gouty RN, BSN Entered By: Baruch Gouty on 02/20/2021 11:23:20 -------------------------------------------------------------------------------- Pain Assessment Details Patient Name: Date of Service: [name] [name], [name] 02/20/2021 10:00 A M Medical Record Number: [id number] Patient Account Number: [id number] Date of Birth/Sex: Treating RN: 17-Feb-1956 (65 y.o. [name] Ole Lafon: [name] Other Clinician: Referring Finneas Mathe: Treating Zilpha Mcandrew/Extender: [name] in Treatment: 30 Active Problems Location of Pain Severity and Description of Pain Patient Has Paino No Site Locations Pain Management and Medication Current Pain Management: Electronic Signature(s) Signed: 02/20/2021 10:42:28 AM By: [name] Signed: 02/20/2021 5:43:52 PM By: Baruch Gouty RN, BSN Entered By: [name] on 02/20/2021 10:27:41 -------------------------------------------------------------------------------- Patient/Caregiver Education Details Patient Name: Date of Service: [name] [name], [name] 9/21/2022andnbsp10:00 A M Medical Record Number: [id number] Patient Account Number: [id number] Date of Birth/Gender: Treating RN: 1955/12/12 (65 y.o. [name] Physician: [name] Other Clinician: Referring Physician: Treating Physician/Extender: [name] in Treatment: 49 Education Assessment Education Provided To: Patient Education Topics Provided Venous: Methods: Explain/Verbal Responses: Reinforcements needed, State content correctly Wound/Skin  Impairment: Methods: Explain/Verbal Responses: Reinforcements needed, State content correctly Electronic Signature(s) Signed: 02/20/2021 5:43:52 PM By: Baruch Gouty RN, BSN Entered By: Baruch Gouty on 02/20/2021 11:23:47 -------------------------------------------------------------------------------- Wound Assessment Details Patient Name: Date of Service: [name] [name], [name] 02/20/2021 10:00 A M Medical Record Number: [id number] Patient Account Number: [id number] Date of Birth/Sex: Treating RN: 02-26-1956 (65 y.o. [name], [name] Primary [name] [place]: [name] Other Clinician: Referring Loney Domingo: Treating Adyline Huberty/Extender: [name] in Treatment: 59 Wound Status Wound Number: 15 Primary Venous Leg Ulcer Etiology: Wound Location: Left, Medial Malleolus Wound Status: Open Wounding Event: Gradually Appeared Comorbid Peripheral Venous Disease, Osteoarthritis, Confinement Date Acquired: 12/30/2019 History: Anxiety Weeks Of Treatment: 57 Clustered Wound: No Photos Wound Measurements Length: (cm) 2.7 Width: (cm) 1.6 Depth: (cm) 0.1 Area: (cm) 3.393 Volume: (cm) 0.339 % Reduction in Area: 70.9% % Reduction in Volume: 96.4% Epithelialization: Small (1-33%) Tunneling: No Undermining: No Wound Description Classification: Full Thickness With Exposed Support Structures Wound Margin: Distinct, outline attached Exudate Amount: Medium Exudate Type: Serosanguineous Exudate Color: red, brown Foul [name] After Cleansing: No Slough/Fibrino Yes Wound Bed Granulation Amount: Large (67-100%) Exposed Structure Granulation Quality: Red Fascia Exposed: No Necrotic Amount: Small (1-33%) Fat Layer (  Subcutaneous Tissue) Exposed: Yes Tendon Exposed: No Muscle Exposed: No Joint Exposed: No Bone Exposed: No Treatment Notes Wound #15 (Malleolus) Wound Laterality: Left, Medial Cleanser Soap and Water Discharge Instruction: May shower and wash wound  with dial antibacterial soap and water prior to dressing change. Peri-Wound [name] Triamcinolone 15 (g) Discharge Instruction: Use triamcinolone 15 (g) as directed Sween Lotion (Moisturizing lotion) Discharge Instruction: Apply moisturizing lotion as directed Topical Primary Dressing PuraPly AM Secondary Dressing Woven Gauze Sponge, Non-Sterile 4x4 in Discharge Instruction: to bolster skin sub ABD Pad, 5x9 Discharge Instruction: Apply over primary dressing and lateral ankle Optifoam Non-Adhesive Dressing, 4x4 in Discharge Instruction: Apply over primary dressing pad lateral foot with foam donut ADAPTIC TOUCH 3x4.25 in Discharge Instruction: Apply over skin sub Secured With Compression Wrap CoFlex TLC XL 2-layer Compression System 4x7 (in/yd) Discharge Instruction: Apply CoFlex 2-layer compression as directed. (alt for 4 layer) Compression Stockings Add-Ons Electronic Signature(s) Signed: 02/20/2021 5:58:14 PM By: [name] Pilling RN, BSN Previous Signature: 02/20/2021 10:42:28 AM Version By: [name] Entered By: [name] on 02/20/2021 11:06:10 -------------------------------------------------------------------------------- Vitals Details Patient Name: Date of Service: [name] [name], [name] 02/20/2021 10:00 [place] Record Number: [id number] Patient Account Number: [id number] Date of Birth/Sex: Treating RN: 03/26/1956 (65 y.o. [name] Awad Gladd: [name] Other Clinician: Referring [place] Rollyson: Treating Criss Bartles/Extender: [name] in Treatment: 68 Vital Signs Time Taken: 10:28 Temperature (F): 98.0 Height (in): 68 Pulse (bpm): 50 Respiratory Rate (breaths/min): 20 Blood Pressure (mmHg): 120/53 Reference Range: 80 - 120 mg / dl Electronic Signature(s) Signed: 02/20/2021 10:42:28 AM By: [name] Entered By: [name] on 02/20/2021 10:30:48

## 2021-02-20 NOTE — Progress Notes (Addendum)
[name], [name] ([id number]) Visit Report for 02/20/2021 Chief Complaint Document Details Patient Name: Date of Service: [facility] [name], [name] 02/20/2021 10:00 A M Medical Record Number: [id number] Patient Account Number: [id number] Date of Birth/Sex: Treating RN: [birth year] (65 y.o. [name] Primary Care Provider: [name] Other Clinician: Referring Provider: Treating Provider/Extender: [name] in Treatment: 14 Information Obtained from: Patient Chief Complaint patient is been followed long-term in this clinic for venous insufficiency ulcers with inflammation, hypertension and ulceration over the medial ankle bilaterally. 01/17/2020; this is a patient who is here for review of postoperative wounds on the left lateral ankle and recurrence of venous stasis ulceration on the left medial Electronic Signature(s) Signed: 02/20/2021 10:55:34 AM By: [name] PA-C Entered By: [name] on 02/20/2021 10:55:33 -------------------------------------------------------------------------------- Cellular or Tissue Based Product Details Patient Name: Date of Service: [facility] [name], ELEA NO R [name]. 02/20/2021 10:00 [place] Record Number: [id number] Patient Account Number: [id number] Date of Birth/Sex: Treating RN: [birth year] (65 y.o. [name] Primary Care Provider: [name] Other Clinician: Referring Provider: Treating Provider/Extender: [name] in Treatment: 50 Cellular or Tissue Based Product Type Wound #15 Left,Medial Malleolus Applied to: Performed By: Physician [name], PA Cellular or Tissue Based Product Type: Puraply AM Level of Consciousness (Pre-procedure): Awake and Alert Pre-procedure Verification/Time Out Yes - 11:30 Taken: Location: trunk / arms / legs Wound Size (sq cm): 4.32 Product Size (sq cm): 12 Waste Size (sq cm): 0 Amount of Product Applied (sq cm): 12 Lot #:  [id number] Order #: [id number] Expiration Date: 04/23/2023 Fenestrated: No Reconstituted: Yes Solution Type: saline Solution Amount: 2 ml Lot #: [id number] Solution Expiration Date: 01/31/2022 Secured: Yes Secured With: adaptic Dressing Applied: Yes Primary Dressing: gauze Procedural Pain: 0 Post Procedural Pain: 0 Response to Treatment: Procedure was tolerated well Level of Consciousness (Post- Awake and Alert procedure): Post Procedure Diagnosis Same as Pre-procedure Electronic Signature(s) Signed: 02/20/2021 5:43:52 PM By: Baruch Gouty RN, BSN Signed: 02/21/2021 2:21:00 PM By: [name] PA-C Entered By: Baruch Gouty on 02/20/2021 11:32:49 -------------------------------------------------------------------------------- HPI Details Patient Name: Date of Service: [name], ELEA NO R [name]. 02/20/2021 10:00 A M Medical Record Number: [id number] Patient Account Number: [id number] Date of Birth/Sex: Treating RN: [birth year] (65 y.o. [name] Primary Care Provider: [name] Other Clinician: Referring Provider: Treating Provider/Extender: [name] in Treatment: 28 History of Present Illness HPI Description: the remaining wound is over the left medial ankle. Similar wound over the right medial ankle healed largely with use of Apligraf. Most recently we have been using Hydrofera Blue over this wound with considerable improvement. The patient has been extensively worked up in the past for her venous insufficiency and she is not a candidate for antireflux surgery although I have none of the details available currently. 08/24/14; considerable improvement today. About 50% of this wound areas now epithelialized. The base of the wound appears to be healthier granulation.as opposed to last week when she had deteriorated a considerable improvement 08/17/14; unfortunately the wound has regressed somewhat. The areas of epithelialization from the superior aspect  are not nearly as healthy as they were last week. The patient thinks her Hydrofera Blue slipped. 09/07/14; unfortunately the area has markedly regressed in the 2 weeks since I've seen this. There is an odor surrounding erythema. The healthy granulation tissue that we had at the base of the wound now is a dusky  color. The nurse reports green drainage 09/14/14; the area looks somewhat better than last week. There is less erythema and less drainage. The culture I did did not show any growth. Nevertheless I think it is better to continue the Cipro and doxycycline for a further week. The remaining wound area was debridement. 09/21/14. Wound did not require debridement last week. Still less erythema and less drainage. She can complete her antibiotics. The areas of epithelialization in the superior aspect of the wound do not look as healthy as they did some weeks ago 10/05/14 continued improvement in the condition of this wound. There is advancing epithelialization. Less aggressive debridement required 10/19/14 continued improvement in the condition and volume of this wound. Less aggressive debridement to the inferior part of this to remove surface slough and fibrinous eschar 11/02/14 no debridement is required. The surface granulation appears healthy although some of her islands of epithelialization seem to have regressed. No evidence of infection 11/16/14; lites surface debridement done of surface eschar. The wound does not look to be unhealthy. No evidence of infection. Unfortunately the patient has had podiatry issues in the right foot and for some reason has redeveloped small surface ulcerations in the medial right ankle. Her original presentation involved wounds in this area 11/23/14 no debridement. The area on the right ankle has enlarged. The left ankle wound appears stable in terms of the surface although there is periwound inflammation. There has been regression in the amount of new skin 11/30/14 no  debridement. Both wound areas appear healthy. There was no evidence of infection. The the new area on the right medial ankle has enlarged although that both the surfaces appear to be stable. 12/07/14; Debridement of the right medial ankle wound. No no debridement was done on the left. 12/14/14 no major change in and now bilateral medial ankle wounds. Both of these are very painful but the no overt evidence of infection. She has had previous venous ablation 12/21/14; patient states that her right medial ankle wound is considerably more painful last week than usual. Her left is also somewhat painful. She could not tolerate debridement. The right medial ankle wound has fibrinous surface eschar 12/28/14 this is a patient with severe bilateral venous insufficiency ulcers. For a considerable period of time we actually had the one on the right medial ankle healed however this recently opened up again in June. The left medial ankle wound has been a refractory area with some absent flows. We had some success with Hydrofera Blue on this area and it literally closed by 50% however it is recently opened up Foley. Both of these were debridement today of surface eschar. She tolerates this poorly 01/25/15: No change in the status of this. Thick adherent escar. Very poor tolerance of any attempt at debridement. I had healed the right medial malleolus wound for a considerable amount of time and had the left one down to about 50% of the volume although this is totally regressed over the last 48 weeks. Further the right leg has reopened. she is trying to make a appointment with pain and vascular, previous ablations with Dr. Aleda Grana. I do not believe there is an arterial insufficiency issue here 02/01/15 the status of the adherent eschar bilaterally is actually improved. No debridement was done. She did not manage to get vascular studies done 02/08/15 continued debridement of the area was done today. The slough is less  adherent and comes off with less pressure. There is no surrounding infection peripheral pulses are intact 02/15/15  selective debridement with a disposable curette. Again the slough is less adherent and comes off with less difficulty. No surrounding infection peripheral pulses are intact. 02/22/15 selective debridement of the right medial ankle wound. Slough comes off with less difficulty. No obvious surrounding infection peripheral pulses are intact I did not debridement the one on the left. Both of these are stable to improved 03/01/15 selective debridement of both wound areas using a curette to. Adherent slough cup soft with less difficulty. No obvious surrounding infection. The patient tells me that 2 days ago she noted a rash above the right leg wrap. She did not have this on her lower legs when she change this over she arrives with widespread left greater than right almost folliculitis-looking rash which is extremely pruritic. I don't see anything to culture here. There is no rash on the rest of her body. She feels well systemically. 03/08/15; selective debridement of both wounds using a curette. Base of this does not look unhealthy. She had limegreen drainage coming out of the left leg wound and describes a lot of drainage. The rash on her left leg looks improved to. No cultures were done. 03/22/15; patient was not here last week. Basal wounds does not look healthy and there is no surrounding erythema. No drainage. There is still a rash on the left leg that almost looks vasculitic however it is clearly limited to the top of where the wrap would be. 04/05/15; on the right required a surgical debridement of surface eschar and necrotic subcutaneous tissue. I did not debridement the area on the left. These continue to be large open wounds that are not changing that much. We were successful at one point in healing the area on the right, and at the same time the area on the left was roughly half the size  of current measurements. I think a lot of the deterioration has to do with the prolonged time the patient is on her feet at work 04/19/15 I attempted-like surface debridement bilaterally she does not tolerate this. She tells me that she was in allergic care yesterday with extreme pain over her left lateral malleolus/ankle and was told that she has an "sprain" 05/03/15; large bilateral venous insufficiency wounds over the medial malleolus/medial aspect of her ankles. She complains of copious amounts of drainage and his usual large amounts of pain. There is some increasing erythema around the wound on the right extending into the medial aspect of her foot to. historically she came in with these wounds the right one healed and the left one came down to roughly half its current size however the right one is reopened and the left is expanded. 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 05/10/15 large bilateral venous insufficiency wounds. There is less adherence surface left however the surface culture that I did last week grew pseudomonas therefore bilateral selective debridement score necessary. There is surrounding erythema. The patient describes severe bilateral drainage and a lot of pain in the left ankle. Apparently her podiatrist was were ready to do a cortisone shot 05/17/15; the patient complains of pain and again copious amounts of drainage. 05/24/15; we used Iodo flex last week. Patient notes considerable improvement in wound drainage. Only needed to change this once. 05/31/15; we continued Iodoflex; the base of these large wounds bilaterally is not too bad but there is probably likely a significant bioburden here. I would like to debridement just doesn't  tolerate it. 06/06/14 I would like to continue the Iodoflex although she still hasn't managed to obtain supplies. She has bilateral medial malleoli or large wounds which are mostly superficial.  Both of them are covered circumferentially with some nonviable fibrinous slough although she tolerates debridement very poorly. She apparently has an appointment for an ablation on the right leg by interventional radiology. 06/14/15; the patient arrives with the wounds and static condition. We attempted a debridement although she does not do well with this secondary to pain. I 07/05/15; wounds are not much smaller however there appears to be a cleaner granulating base. The left has tight fibrinous slough greater than the right. Debridement is tolerated poorly due to pain. Iodoflex is done more for these wounds in any of the multitude of different dressings I have tried on the left 1 and then subsequently the right. 07/12/15; no change in the condition of this wound. I am able to do an aggressive debridement on the right but not the left. She simply cannot tolerate it. We have been using Iodoflex which helps somewhat. It is worthwhile remembering that at one point we healed the right medial ankle wound and the left was about 25% of the current circumference. We have suggested returning to vascular surgery for review of possible further ablations for one reason or another she has not been able to do this. 07/26/15 no major change in the condition of either wound on her medial ankle. I did not attempt to debridement of these. She has been aggressively scrubbing these while she is in the shower at home. She has her supply of Iodoflex which seems to have done more for these wounds then anything I have put on recently. 08/09/15 wound area appears larger although not verified by measurements. Using Iodoflex 09/05/2015 -- she was here for avisit today but had significant problems with the wound and I was asked to see her for a physician opinion. I have summarize that this lady has had surgery on her left lower extremity about 10 years ago where the possible veins stripping was done. She has had an opinion from  interventional radiology around November 2016 where no further sclerotherapy was ordered. The patient works 12 hours a day and stands on a concrete floor with work boots and is unable to get the proper compression she requires and cannot elevate her limbs appropriately at any given time. She has recently grown Pseudomonas from her wound culture but has not started her ciprofloxacin which was called in for her. 09/13/15 this continues to be a difficult situation for this patient. At one point I had this wound down to a 1.5 x 1.5" wound on her left leg. This is deteriorated and the right leg has reopened. She now has substantial wounds on her medial calcaneus, malleoli and into her lower leg. One on the left has surface eschar but these are far too painful for me to debridement here. She has a vascular surgery appointment next week to see if anything can be done to help here. I think she has had previous ablations several years ago at [facility]. She has no major edema. She tells me that she did not get product last time [facility]] and went for several days without it. She continues to work in work boots 12 hours a day. She cannot get compression/4-layer under her work boots. 09/20/15 no major change. Periwound edema control was not very good. Her point with pain and vascular is next Wednesday the 25th  09/28/15; the patient is seen vascular surgery and is apparently scheduled for repeat duplex ultrasounds of her bilateral lower legs next week. 10/05/15; the patient was seen by Dr. Doren Custard of vascular surgery. He feels that she should have arterial insufficiency excluded as cause/contributed to her nonhealing stage she is therefore booked for an arteriogram. She has apparently monophasic signals in the dorsalis pedis pulses. She also of course has known severe chronic venous insufficiency with previous procedures as noted previously. I had another long discussion with the patient today about her continuing  to work 12 hour shifts. I've written her out for 2 months area had concerns about this as her work location is currently undergoing significant turmoil and this may lead to her termination. She is aware of this however I agree with her that she simply cannot continue to stand for 12 hours multiple days a week with the substantial wound areas she has. 10/19/15; the Dr. Doren Custard appointment was largely for an arteriogram which was normal. She does not have an arterial issue. He didn't make a comment about her chronic venous insufficiency for which she has had previous ablations. Presumably it was not felt that anything additional could be done. The patient is now out of work as I prescribed 2 weeks ago. Her wounds look somewhat less aggravated presumably because of this. I felt I would give debridement another try today 10/25/15; no major change in this patient's wounds. We are struggling to get her product that she can afford into her own home through her insurance. 11/01/15; no major change in the patient's wounds. I have been using silver alginate as the most affordable product. I spoke to Dr. Marla Roe last week with her requested take her to the OR for surgical debridement and placement of ACEL. Dr. Marla Roe told me that she would be willing to do this however [facility] will not cover this, fortunately the patient has [place] healthcare of some variant 11/08/15; no major change in the patient's wounds. She has been completely nonviable surface that this but is in too much pain with any attempted debridement are clinic. I have arranged for her to see Dr. Marla Roe ham of plastic surgery and this appointment is on Monday. I am hopeful that they will take her to the OR for debridement, possible ACEL ultimately possible skin graft 11/22/15 no major change in the patient's wounds over her bilateral medial calcaneus medial malleolus into the lower legs. Surface on these does not look too bad  however on the left there is surrounding erythema and tenderness. This may be cellulitis or could him sleepy tinea. 11/29/15; no major changes in the patient's wounds over her bilateral medial malleolus. There is no infection here and I don't think any additional antibiotics are necessary. There is now plan to move forward. She sees Dr. Marla Roe in a week's time for preparation for operative debridement and ACEL placement I believe on 7/12. She then has a follow-up appointment with Dr. Marla Roe on 7/21 12/28/15; the patient returns today having been taken to the [place] by Dr. Marla Roe 12/12/15 she underwent debridement, intraoperative cultures [which were negative]. She had placement of a wound VAC. Parent really ACEL was not available to be placed. The wound VAC foam apparently adhered to the wound since then she's been using silver alginate, Xeroform under Ace wraps. She still says there is a lot of drainage and a lot of pain 01/31/16; this is a patient I see monthly. I had referred her to Dr.  Dillingham him of plastic surgery for large wounds on her bilateral medial ankles. She has been to the OR twice once in early July and once in early August. She tells me over the last 3 weeks she has been using the wound VAC with ACEL underneath it. On the right we've simply been using silver alginate. Under Kerlix Coban wraps. 02/28/16; this is a patient I'm currently seeing monthly. She is gone on to have a skin graft over her large venous insufficiency ulcer on the left medial ankle. This was done by Dr. Marla Roe him. The patient is a bit perturbed about why she didn't have one on her right medial ankle wound. She has been using silver alginate to this. 03/06/16; I received a phone call from her plastic surgery Dr. Marla Roe. She expressed some concern about the viability of the skin graft she did on the left medial ankle wound. Asked me to place Endoform on this. She told me she is not planning to do a  subsequent skin graft on the right as the left one did not take very well. I had placed Hydrofera Blue on the right 03/13/16; continue to have a reasonably healthy wound on the right medial ankle. Down to 3 mm in terms of size. There is epithelialization here. The area on the left medial ankle is her skin graft site. I suppose the last week this looks somewhat better. She has an open area inferiorly however in the center there appears to be some viable tissue. There is a lot of surface callus and eschar that will eventually need to come off however none of this looked to be infected. Patient states that the is able to keep the dressing on for several days which is an improvement. 03/20/16 no major change in the circumference of either wound however on the left side the patient was at Dr. Eusebio Friendly office and they did a debridement of left wound. 50% of the wound seems to be epithelialized. I been using Endoform on the left Hydrofera Blue in the right 03/27/16; she arrives today with her wound is not looking as healthy as they did last week. The area on the right clearly has an adherent surface to this a very similar surface on the left. Unfortunately for this patient this is all too familiar problem. Clearly the Endoform is not working and will need to change that today that has some potential to help this surface. She does not tolerate debridement in this clinic very well. She is changing the dressing wants 04/03/16; patient arrives with the wounds looking somewhat better especially on the right. Dr. Migdalia Dk change the dressing to silver alginate when she saw her on Monday and also sold her some compression socks. The usefulness of the latter is really not clear and woman with severely draining wounds. 04/10/16; the patient is doing a bit of an experiment wearing the compression stockings that Dr. Migdalia Dk provided her to her left leg and the out of legs based dressings that we provided to the  right. 05/01/16; the patient is continuing to wear compression stockings Dr. Migdalia Dk provided her on the left that are apparently silver impregnated. She has been using Iodoflex to the right leg wound. Still a moderate amount of drainage, when she leaves here the wraps only last for 4 days. She has to change the stocking on the left leg every night 05/15/16; she is now using compression stockings bilaterally provided by Dr. Marla Roe. She is wearing a nonadherent layer over the wounds  so really I don't think there is anything specific being done to this now. She has some reduction on the left wound. The right is stable. I think all healing here is being done without a specific dressing 06/09/16; patient arrives here today with not much change in the wound certainly in diameter to large circular wounds over the medial aspect of her ankle bilaterally. Under the light of these services are certainly not viable for healing. There is no evidence of surrounding infection. She is wearing compression stockings with some sort of silver impregnation as prescribed by Dr. Marla Roe. She has a follow-up with her tomorrow. 06/30/16; no major change in the size or condition of her wounds. These are still probably covered with a nonviable surface. She is using only her purchase stockings. She did see Dr. Marla Roe who seemed to want to apply Dakin's solution to this I'm not extreme short what value this would be. I would suggest Iodoflex which she still has at home. 07/28/16; I follow Mrs. Carli episodically along with Dr. Marla Roe. 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 She has been applying a topical collagen ointment to both wounds with Adaptic. I don't think Dr. Marla Roe is planning to take her back to the OR. 08/19/16; I follow Mrs. Jeneen Rinks on a monthly basis along with Dr. Marla Roe of plastic surgery. She has very refractory venous  insufficiency wounds on the bilateral medial lower legs left greater than right. I been following her for a number of years. At one point I was able to get the right medial malleolus wound to heal and had the left medial malleolus down to about half its current size however and I had to send her to plastic surgery for an operative debridement. Since then things have been stable to slightly improve the area on the right is slightly better one in the left about the same although there is much less adherent surface than I'm used to with this patient. She is using some form of liquid collagen gel that Dr. Marla Roe provided a Kerlix cover with the patient's own pressure stockings. She tells me that she has extreme pain in both ankles and along the lateral aspect of both feet. She has been unable to work for some period of time. She is telling me she is retiring at the beginning of April. She sees Dr. Doran Durand of orthopedics next week 09/22/16; patient has not seen Dr. Marla Roe since the last time she is here. I'm not really sure what she is using to the wounds other than bits and pieces of think she had left over including most recently Hydrofera Blue. She is using juxtalite stockings. She is having difficulty with her husband's recent illness "stroke". She is having to transport him to various doctors appointments. Dr. Marla Roe left her the option of a repeat debridement with ACEL however she has not been able to get the time to follow-up on this. She continues to have a fair amount of drainage out of these wounds with certainly precludes leaving dressings on all week 10/13/16; patient has not seen Dr. Marla Roe since she was last in our clinic. I'm not really sure what she is doing with the wounds, we did try to get her [facility] and I think she is actually using this most of the time. Because of drainage she states she has to change this every second day although this is an improvement from what  she used to do.  She went to see Dr. Doran Durand who did not think she had a muscular issue with regards to her feet, he referred her to a neurologist and I think the appointment is sometime in June. I changed her back to Iodoflex which she has used in the past but not recently. 11/03/16; the patient has been using Iodoflex although she ran out of this. Still claims that there is a lot of drainage although the wound does not look like this. No surrounding erythema. She has not been back to see Dr. Marla Roe 11/24/16; the patient has been using Iodoflex again but she ran out of it 2 or 3 days ago. There is no major change in the condition of either one of these wounds in fact they are larger and covered in a thick adherent surface slough/nonviable tissue especially on the left. She does not tolerate mechanical debridement in our clinic. Going back to see Dr. Marla Roe of plastic surgery for an operative debridement would seem reasonable. 12/15/16; the patient has not been back to see Dr. Marla Roe. She is been dealing with a series of illnesses and her husband which of monopolized her time. She is been using Sorbact which we largely supplied. She states the drainage is bad enough that it maximum she can go 2-3 days without changing the dressing 01/12/2017 -- the patient has not been back for about 4 weeks and has not seen Dr. Marla Roe not does she have any appointment pending. 01/23/17; patient has not seen Dr. Marla Roe even though I suggested this previously. She is using Santyl that was suggested last week by Dr. Con Memos this Cost her $16 through her insurance which is indeed surprising 02/12/17; continuing Santyl and the patient is changing this daily. A lot of drainage. She has not been back to see plastic surgery she is using an Ace wrap. Our intake nurse suggested wrap around stockings which would make a good reasonable alternative 02/26/17; patient is been using Santyl and changing this daily due to  drainage. She has not been to see plastic surgery she uses in April Ace wrap to control the edema. She did obtain extremitease stockings but stated that the edema in her leg was to big for these 03/20/17; patient is using Santyl and Anasept. Surfaces looked better today the area on the right is actually measuring a little smaller. She has states she has a lot of pain in her feet and ankles and is asking for a consult to pain control which I'll try to help her with through our case manager. 04/10/17; the patient arrives with better-looking wound surfaces and is slightly smaller wound on the left she is using a combination of Santyl and Anasept. She has an appointment or at least as started in the pain control center associated with [facility] 05/14/17; this is a patient who I followed for a prolonged period of time. She has venous insufficiency ulcers on her bilateral medial ankles. At one point I had this down to a much smaller wound on the left however these reopened and we've never been able to get these to heal. She has been using Santyl and Anasept gel although 2 weeks ago she ran out of the Anasept gel. She has a stable appearance of the wound. She is going to the wound care clinic at [facility]. They wanted do a nerve block/spinal block although she tells me she is reluctant to go forward with that. 05/21/17; this is a patient I have followed for many years. She has  venous insufficiency ulcers on her bilateral medial ankles. Chronic pain and deformity in her ankles as well. She is been to see plastic surgery as well as orthopedics. Using PolyMem AG most recently/Kerramax/ABDs and 2 layer compression. She has managed to keep this on and she is coming in for a nurse check to change the dressing on Tuesdays, we see her on Fridays 06/05/17; really quite a good looking surface and the area especially on the right medial has contracted in terms of dimensions. Well granulated  healthy-looking tissue on both sides. Even with an open curet there is nothing that even feels abnormal here. This is as good as I've seen this in quite some time. We have been using PolyMem AG and bringing her in for a nurse check 06/12/17; really quite good surface on both of these wounds. The right medial has contracted a bit left is not. We've been using PolyMem and AG and she is coming in for a nurse visit 06/19/17; we have been using PolyMem AG and bringing her in for a nurse check. Dimensions of her wounds are not better but the surfaces looked better bilaterally. She complained of bleeding last night and the left wound and increasing pain bilaterally. She states her wound pain is more neuropathic than just the wounds. There was some suggestion that this was radicular from her pain management doctor in talking to her it is really difficult to sort this out. 06/26/17; using PolyMem and AG and bringing her in for a nurse check as All of this and reasonably stable condition. Certainly not improved. The dimensions on the lateral part of the right leg look better but not really measuring better. The medial aspect on the left is about the same. 07/03/16; we have been using PolyMen AG and bringing her in for a nurse check to change the dressings as the wounds have drainage which precludes once weekly changing. We are using all secondary absorptive dressings.our intake nurse is brought up the idea of using a wound VAC/snap VAC on the wound to help with the drainage to see if this would result in some contraction. This is not a bad idea. The area on the right medial is actually looking smaller. Both wounds have a reasonable-looking surface. There is no evidence of cellulitis. The edema is well controlled 07/10/17; the patient was denied for a snap VAC by her insurance. The major issue with these wounds continues to be drainage. We are using wicked PolyMem AG and she is coming in for a nurse visit to change  this. The wounds are stable to slightly improved. The surface looks vibrant and the area on the right certainly has shrunk in size but very slowly 07/17/17; the patient still has large wounds on her bilateral medial malleoli. Surface of both of these wounds looks better. The dimensions seem to come and go but no consistent improvement. There is no epithelialization. We do not have options for advanced treatment products due to insurance issues. They did not approve of the wound VAC to help control the drainage. More recently we've been using PolyMem and AG wicked to allow drainage through. We have been bringing her in for a nurse visit to change this. We do not have a lot of options for wound care products and the home again due to insurance issues 07/24/17; the patient's wound actually looks somewhat better today. No drainage measurements are smaller still healthy-looking surface. We used silver collagen under PolyMen started last week. We have been bringing her  in for a dressing change 07/31/17; patient's wound surface continued to look better and I think there is visible change in the dimensions of the wound on the right. Rims of epithelialization. We have been using silver collagen under PolyMen and bringing her in for a dressing change. There appears to be less drainage although she is still in need of the dressing change 08/07/17. Patient's wound surface continues to look better on both sides and the area on the right is definitely smaller. We have been using silver collagen and PolyMen. She feels that the drainage has been it has been better. I asked her about her vascular status. She went to see Dr. Aleda Grana at [facility] and had some form of ablation. I don't have much detail on this. I haven't my notes from 2016 that she was not a candidate for any further ablation but I don't have any more information on this. We had referred her to vein and vascular I don't think she ever went. He does not  have a history of PAD although I don't have any information on this either. We don't even have ABIs in our record 08/14/17; we've been using silver collagen and PolyMen cover. And putting the patient and compression. She we are bringing her in as a nurse visit to change this because ofarge amount of drainage. We didn't the ABIs in clinic today since they had been done in many moons 1.2 bilaterally. She has been to see vein and vascular however this was at [facility] and she had ablation although I really don't have any information on this all seemed biking get a report. She is also been operatively debrided by plastic surgery and had a cell placed probably 8-12 months ago. This didn't have a major effect. We've been making some gains with current dressings 08/19/17-She is here in follow-up evaluation for bilateral medial malleoli ulcers. She continues to tolerate debridement very poorly. We will continue with recently changed topical treatment; if no significant improvement may consider switching to Iodosorb/Iodoflex. 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 She has been using silver collagen and PolyMem. I believe she has been used and tried on Iodoflex before. 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 These have been very refractory over time. We have been using silver collagen and PolyMen. She is coming in once a week for a doctor's and once a week for nurse visits. We are actually making some progress 09/18/17; the patient's wounds are smaller especially on the right medial. She arrives today to upset to consider even washing these off with Anasept which I think is been part of the reason this is been closing. We've been using collagen covered in PolyMen  otherwise. It is noted that she has a small area of folliculitis on the right medial calf that. As we are wrapping her legs I'll give her a short course of doxycycline to make sure this doesn't amount to anything. She is a long list of complaints today including imbalance, shortness of breath on exertion, inversion of her left ankle. With regards to the latter complaints she is been to see orthopedics and they offered her a tendon release surgery I believe but wanted her wounds to be closed first. I have recommended she go see her primary physician  with regards to everything else. 09/25/17; patient's wounds are about the same size. We have made some progress bilaterally although not in recent weeks. She will not allow me T wash these wounds with Anasept even if she is doing her cell. Wheeze we've been using collagen covered in PolyMen. Last week she had a small area of folliculitis this is now opened into a small wound. She completed 5 days of trimethoprim sulfamethoxazole 10/02/17; unfortunately the area on her left medial ankle is worse with a larger wound area towards the Achilles. The patient complains of a lot of pain. She will not allow debridement although visually I don't think there is anything to debridement in any case. We have been using silver collagen and PolyMen for several months now. Initially we are making some progress although I'm not really seeing that today. We will move back to [facility]. His admittedly this is a bit of a repeat however I'm hoping that his situation is different now. The patient tells me she had her leg on the left give out on her yesterday this is process some pain. 10/09/17; the patient is seen twice a week largely because of drainage issues coming out of the chronic medial bimalleolar wounds that are chronic. Last week the dimensions of the one on the left looks a little larger I changed her to [facility]. She comes in today with a history of terrible  pain in the bilateral wound areas. She will not allow debridement. She will not even allow a tissue culture. There is no surrounding erythema no no evidence of cellulitis. We have been putting her Kerlix Coban man. She will not allow more aggressive compression as there was a suggestion to put her in 3 layer wraps. 10/16/17; large wounds on her bilateral medial malleoli. These are chronic. Not much change from last week. The surface looks have healthy but absolutely no epithelialization. A lot of pain little less so of drainage. She will not allow debridement or even washing these off in the vigorous fashion with Anasept. 10/23/17; large wounds on her bilateral malleoli which are chronic. Some improvement in terms of size perhaps on the right since last time I saw these. She states that after we increased the 3 layer compression there was some bleeding, when she came in for a nurse visit she did not want 3 layer compression put back on about our nurse managed to convince her. She has known chronic venous visit issues and I'm hoping to get her to tolerate the 3 layer compression. using Hydrofera Blue 10/30/17; absolutely no change in the condition of either wound although we've had some improvement in dimensions on the right.. Attempted to put her in 3 layer compression she didn't tolerated she is back in 2 layer compression. We've been using Hydrofera Blue We looked over her past records. She had venous reflux studies in November 2016. There was no evidence of deep venous reflux on the right. Superficial vein did not show the greater saphenous vein at think this is been previously ablated the small saphenous vein was within normal limits. The left deep venous system showed no DVT the vessels were positive for deep venous reflux in the posterior tibial veins at the ankle. The greater saphenous vein was surgically absent small saphenous vein was within normal limits. She went to vein and vascular at  [facility]. I believe she had an ablation on the left greater saphenous vein. I'll update her reflux studies perhaps ever reviewed by vein  and vascular. We've made absolutely no progress in these wounds. Will also try to read and TheraSkins through her insurance 11/06/17; W the patient apparently has a 2 week follow-up with vein and vascular I like him to review the whole issue with regards to her previous vascular workup by Dr. Aleda Grana. We've really made no progress on these wounds in many months. She arrives today with less viable looking surface on the left medial ankle wound. This was apparently looking about the same on Tuesday when she was here for nurse visit. 11/13/17; deep tissue culture I did last time of the left lower leg showed multiple organisms without any predominating. In particular no Staphylococcus or group A strep were isolated. We sent her for venous reflux studies. She's had a previous left greater saphenous vein stripping and I think sclerotherapy of the right greater saphenous vein. She didn't really look at the lesser saphenous vein this both wounds are on the medial aspect. She has reflux in the common femoral vein and popliteal vein and an accessory vein on the right and the common femoral vein and popliteal vein on the left. I'm going to have her go to see vein and vascular just the look over things and see if anything else beside aggressive compression is indicated here. We have not been able to make any progress on these wounds in spite of the fact that the surface of the wounds is never look too bad. 11/20/17; no major change in the condition of the wounds. Patient reports a large amount of drainage. She has a lot of complaints of pain although enlisting her today I wonder if some of this at least his neuropathic rather than secondary to her wounds. She has an appointment with vein and vascular on 12/30/17. The refractory nature of these wounds in my mind at least  need vein and vascular to look over the wounds the recent reflux studies we did and her history to see if anything further can be done here. I also note her gait is deteriorated quite a bit. 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 She has a bilateral Trendelenburg gait. I wonder if this is neuropathic or perhaps multilevel radicular. 11/27/17; her wounds actually looks slightly better. Healthy-looking granulation tissue a scant amount of epithelialization. [place] healthcare will not pay for [employer]. They will play for tri layer Oasis and Dermagraft. This is not a diabetic ulcer. We'll try for the tri layer Oasis. She still complains of some drainage. She has a vein and vascular appointment on 12/30/17 12/04/17; the wounds visually look quite good. Healthy-looking granulation with some degree of epithelialization. We are still waiting for response to our request for trial to try layer Oasis. Her appointment with vascular to review venous and arterial issues isn't sold the end of July 7/31. Not allow debridement or even vigorous cleansing of the wound surface. 12/18/17; slightly smaller especially on the right. 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 We've been using Hydrofera Blue. We still are looking into triple layer Oasis through her insurance 01/08/18 on evaluation today patient's wound actually appears to be showing signs of good improvement at this point in time. She has been tolerating the dressing changes without complication. Fortunately there does not appear to be any evidence of infection at this point in time. We have been utilizing silver nitrate which does seem to be of benefit for her which is also good news. Overall I'm very happy with how things seem  to be both regards appearance as well as measurement. Patient did see Dr. Bridgett Larsson for evaluation on 12/30/17. In his assessment he felt that stripping would not likely add much more than chronic  compression to the patient's healing process. His recommendation was to follow-up in three months with Dr. Doren Custard if she hasn't healed in order to consider referral back to you and see vascular where she previously was in a trial and was able to get her wound to heal. I'll be see what she feels she when you staying compression and he reiterated this as well. 01/13/18 on evaluation today patient appears to actually be doing very well in regard to her bilateral medial malleolus ulcers. She seems to have tolerated the chemical cauterization with silver nitrate last week she did have some pain through that evening but fortunately states that I'll be see since it seems to be doing better she is overall pleased with the progress. 01/21/18; really quite a remarkable improvement since I've last seen these wounds. We started using silver nitrate specially on the islands of hyper granulation which for some reason her around the wound circumference. This is really done quite nicely. Primary dressing Hydrofera Blue under 4 layer compression. She seems to be able to hold out without a nurse rewrap. Follow-up in 1 week 01/28/18; we've continued the hydrofera blue but continued with chemical cauterization to the wound area that we started about a month ago for irregular hyper granulation. She is made almost stunning improvement in the overall wound dimensions. I was not really expecting this degree of improvement in these chronic wounds 02/05/18; we continue with Hydrofera Bluebut of also continued the aggressive chemical cauterization with silver nitrate. We made nice progress with the right greater than left wound. 02/12/18. We continued with Hydrofera Blue after aggressive chemical cauterization with silver nitrate. We appear to be making nice progress with both wound areas 02/19/2018; we continue with [facility] after washing the wounds vigorously with Anasept spray and chemical cauterization with silver nitrate.  We are making excellent progress. The area on the right's just about closed 02/26/2018. The area on the left medial ankle had too much necrotic debris today. I used a #5 curette we are able to get most of the soft. I continued with the silver nitrate to the much smaller wound on the right medial ankle she had a new area on her right lower pretibial area which she says was due to a role in her compression 03/05/2018; both wound areas look healthy. Not much change in dimensions from last week. I continue to use silver nitrate and Hydrofera Blue. The patient saw Dr. Doren Custard of vein and vascular. He felt she had venous stasis ulcers. He felt based on her previous arteriogram she should have adequate circulation for healing. Also she has deep venous reflux but really no significant correctable superficial venous reflux at this time. He felt we should continue with conservative management including leg elevation and compression 04/02/2018; since we last saw this woman about a month ago she had a fall apparently suffered a pelvic fracture. I did not look up the x-ray. Nevertheless because of pain she literally was bedbound for 2 weeks and had home health coming out to change the dressing. Somewhat predictably this is resulted in considerable improvement in both wound areas. The right is just about closed on the medial malleolus and the left is about half the size. 04/16/2018; both her wounds continue to go down in size. Using Hydrofera Blue. 05/07/18;  both her wounds appeared to be improving especially on the right where it is almost closed. We are using Hydrofera Blue 05/14/2018; slightly worse this week with larger wounds. Surface on the left medial not quite as good. We have been using Hydrofera Blue 05/21/18; again the wounds are slightly larger. Left medial malleolus slightly larger with eschar around the circumference. We have been using Hydrofera Blue undergoing a wraps for a prolonged period of time. This  got a lot better when she was more recumbent due to a fall and a back injury. I change the primary dressing the silver alginate today. She did not tolerate a 4 layer compression previously although I may need to bring this up with her next time 05/28/2018; area on the left medial malleolus again is slightly larger with more drainage. Area on the right is roughly unchanged. She has a small area of folliculitis on the right medial just on the lower calf. This does not look ominous. 06/03/2018 left medial malleolus slightly smaller in a better looking surface. We used silver nitrate on this last time with silver alginate. The area on the right appears slightly smaller 1/10; left medial malleolus slightly smaller. Small open area on the right. We used silver nitrate and silver alginate as of 2 weeks ago. We continue with the wound and compression. These got a lot better when she was off her feet 1/17; right medial malleolus wound is smaller. The left may be slightly smaller. Both surfaces look somewhat better. 1/24; both wounds are slightly smaller. Using silver alginate under Unna boots 1/31; both wounds appear smaller in fact the area on the right medial is just about closed. Surface eschar. We have been using silver alginate under Unna boots. The patient is less active now spends let much less time on her feet and I think this is contributed to the general improvement in the wound condition 2/7; both wounds appear smaller. I was hopeful the right medial would be closed however there there is still the same small open area. Slight amount of surface eschar on the left the dimensions are smaller there is eschar but the wound edges appear to be free. We have been using silver alginate under Unna boot's 2/14; both wounds once again measure smaller. Circumferential eschar on the left medial. We have been using silver alginate under Unna boots with gradual improvement 2/21; the area on the right medial  malleolus has healed. The area on the left is smaller. We have been using silver alginate and Unna boots. We can discharge wrapping the right leg she has 20/30 stockings at home she will need to protect the scar tissue in this area 2/28; the area on the right medial malleolus remains closed the patient has a compression stocking. The area on the left is smaller. We have been using silver alginate and Unna boots. 3/6 the area on the right medial ankle remains closed. Good edema control noted she is using her own compression stocking. The area on the left medial ankle is smaller. We have been managing this with silver alginate and Unna boots which we will continue today. 3/13; the area on the right medial ankle remains closed and I'm declaring it healed today. When necessary the left is about the same still a healthy-looking surface but no major change and wound area. No evidence of infection and using silver alginate under unna and generally making considerable improvement 3/27 the area on the right medial ankle remains closed the area on the left  is about the same as last week. Certainly not any worse we have been using silver alginate under an Unna boot 4/3; the area on the right medial ankle remains closed per the patient. We did not look at this wound. The wound on the left medial ankle is about the same surface looks healthy we have been using silver alginate under an Unna boot 4/10; area on the right medial ankle remains closed per the patient. We did not look at this wound. The wound on the left medial ankle is slightly larger. The patient complains that the [facility] caused burning pain all week. She also told us that she was a lot more active this week. Changed her back to silver alginate 4/17; right medial ankle still closed per the patient. Left medial ankle is slightly larger. Using silver alginate. She did not tolerate Hydrofera Blue on this area 4/24; right medial ankle remains  closed we have not look at this. The left medial ankle continues to get larger today by about a centimeter. We have been using silver alginate under Unna boots. She complains about 4 layer compression as an alternative. She has been up on her feet working on her garden 5/8; right medial ankle remains closed we did not look at this. The left medial ankle has increased in size about 100%. We have been using silver alginate under Unna boots. She noted increased pain this week and was not surprised that the wound is deteriorated 5/15; no major change in SA however much less erythema ( one week of doxy ocellulitis). 5/22-65 year old female returns at 1 week to the clinic for left medial ankle wound for which we have been using silver alginate under 3 layer compression She was placed on DOXY at last visit - the wound is wider at this visit. She is in 3 layer compression 5/29; change to [facility] last week. I had given her empiric doxycycline 2 weeks ago for a week. She is in 3 layer compression. She complains of a lot of pain and drainage on presentation today. 6/5; using Hydrofera Blue. I gave her doxycycline recently empirically for erythema and pain around the wound. Believe her cultures showed enterococcus which not would not have been well covered by doxycycline nevertheless the wound looks better and I don't feel specifically that the enterococcus needs to be covered. She has a new what looks like a wrap injury on her lateral left ankle. 6/12; she is using Hydrofera Blue. She has a new area on the left anterior lower tibial area. This was a wrap injury last week. 6/19; the patient is using Hydrofera Blue. She arrived with marked inflammation and erythema around the wound and tenderness. 12/01/18 on evaluation today patient appears to be doing a little bit better based on what I'm hearing from the standpoint of lassos evaluation to this as far as the overall appearance of the wound is concerned.  Then sometime substandard she typically sees Dr. Dellia Nims. Nonetheless overall very pleased with the progress that she's made up to this point. No fevers, chills, nausea, or vomiting noted at this time. 7/10; some improvement in the surface area. Aggressively debrided last week apparently. I went ahead with the debridement today although the patient does not tolerate this very well. We have been using Iodoflex. Still a fair amount of drainage 7/17; slightly smaller. Using Iodoflex. 7/24; no change from last week in terms of surface area. We have been using Iodoflex. Surface looks and continues to look somewhat better  7/31; surface area slightly smaller better looking surface. We have been using Iodoflex. This is under Unna boot compression 8/7-Patient presents at 1 week with Unna boot and Iodoflex, wound appears better 8/14-Patient presents at 1 week with Iodoflex, we use the Unna boot, wound appears to be stable better.Patient is getting Botox treatment for the inversion of the foot for tendon release, Next week 8/21; we are using Iodoflex. Unna boot. The wound is stable in terms of surface area. Under illumination there is some areas of the wound that appear to be either epithelialized or perhaps this is adherent slough at this point I was not really clear. It did not wipe off and I was reluctant to debride this today. 8/28; we are using Iodoflex in an Unna boot. Seems to be making good improvement. 9/4; using Iodoflex and wound is slightly smaller. 9/18; we are using Iodoflex with topical silver nitrate when she is here. The wound continues to be smaller 10/2; patient missed her appointment last week due to GI issues. She left and Iodoflex based dressing on for 2 weeks. Wound is about the same size about the size of a dime on the left medial lower 10/9 we have been using Iodoflex on the medial left ankle wound. She has a new superficial probable wrap injury on the dorsal left ankle 10/16; we have  been using Hydrofera Blue since last week. This is on the left medial ankle 10/23; we have been using Hydrofera Blue since 2 weeks ago. This is on the left medial ankle. Dimensions are better 11/6; using Hydrofera Blue. I think the wound is smaller but still not closed. Left medial ankle 11/13; we have been using Hydrofera Blue. Wound is certainly no smaller this week. Also the surface not as good. This is the remanent of a very large area on her left medial ankle. 11/20; using Sorbact since last week. Wound was about the same in terms of size although I was disappointed about the surface debris 12/11; 3-week follow-up. Patient was on vacation. Wound is measuring slightly larger we have been using Sorbact. 12/18; wound is about the same size however surface looks better last week after debridement. We have been using Sorbact under compression 1/15 wound is probably twice the size of last time increased in length nonviable surface. We have been using Sorbact. She was running a mild fever and missed her appointment last week 1/22; the wound is come down in size but under illumination still a very adherent debris we have been Hydrofera Blue that I changed her to last week 1/29; dimensions down slightly. We have been using Hydrofera Blue 2/19 dimensions are the same however there is rims of epithelialization under illumination. Therefore more the surface area may be epithelialized 2/26; the patient's wound actually measures smaller. The wound looks healthy. We have been using Hydrofera Blue. I had some thoughts about running Apligraf then I still may do that however this looks so much better this week we will delay that for now 3/5; the wound is small but about the same as last week. We have been using Hydrofera Blue. No debridement is required today. 3/19; the wound is about the size of a dime. Healthy looking wound even under illumination. We have been using Hydrofera Blue. No mechanical  debridement is necessary 3/26; not much change from last week although still looks very healthy. We have been using Hydrofera Blue under Unna boots Patient was offered an ankle fusion by podiatry but not until the wound  heals with a proceed with this. 4/9; the patient comes in today with her original wound on the medial ankle looking satisfactory however she has some uncontrolled swelling in the middle part of her leg with 2 new open areas superiorly just lateral to the tibia. I think this was probably a wrap issue. She said she felt uncomfortable during the week but did not call in. We have been using Hydrofera Blue 4/16; the wound on the medial ankle is about the same. She has innumerable small areas superior to this across her mid tibia. I think this is probably folliculitis. She is also been working in the yard doing a lot of sweating 4/30; the patient issue on the upper areas across her mid tibia of all healed. I think this was excessive yard work if I remember. Her wound on the medial ankle is smaller. Some debris on this we have been using Hydrofera Blue under Unna boots 5/7; mid tibia. She has been using Hydrofera Blue under an Unna wrap. She is apparently going for her ankle surgery on June 3 10/28/19-Patient returns to clinic with the ankle wound, we are using Hydrofera Blue under Unna wrap, surgery is scheduled for her left foot for June 3 so she will be back for nurse visit next week READMISSION 01/17/2020 Mrs. [name] is a 65 year old woman we have had in this clinic for a long period of time with severe venous hypertension and refractory wounds on her medial lower legs and ankles bilaterally. 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 When she was off her legs for a prolonged period example when she fell and suffered a compression fracture things would heal up quite  nicely. She is now retired and we managed to heal up the right medial leg wound. The left one was very tiny last time I saw this although still refractory. 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 It got to the point where this was interfering with her walking and she elected to proceed with a ankle arthrodesis to straighten her her ankle and leave her with a functional outcome for mobilization. The patient was referred to Dr. Doren Custard and really this took some time to arrange. Dr. Doren Custard saw her on 12/07/2019. Once again he verified that she had no arterial issues. She had previously had an angiogram several years ago. Follow-up ABIs on the left showed an ABI of 1.12 with triphasic waveforms and a TBI of 0.92. She is felt to have chronic deep venous insufficiency but I do not think it was felt that anything could be done from about this from an ablation point of view. At the time Dr. Doren Custard saw this patient the wounds actually look closed via the pictures in his clinic. The patient finally underwent her surgery on 12/15/2019. This went reasonably well and there was a good anatomic outcome. She developed a small distal wound dehiscence on the lateral part of the surgical wound. However more problematically she is developed recurrence of the wound on the medial left ankle. There are actually 2 wounds here one in the distal lower leg and 1 pretty much at the level of the medial malleolus. It is a more distal area that is more problematic. She has been using Hydrofera Blue which started on Friday before this  she was simply Ace wrapping. There was a culture done that showed Pseudomonas and she is on ciprofloxacin. A recent CNS on 8/11 was negative. The patient reports some pain but I generally think this is improving. She is using a cam boot completely nonweightbearing using a walker for pivot transfers and a  wheelchair 8/24; not much improvement unfortunately she has a surgical wound on the lateral part in the venous insufficiency wound medially. The bottom part of the medial insufficiency wound is still necrotic there is exposed tendon here. We have been using Hydrofera Blue under compression. Her edema control is however better 8/31; patient in for follow-up of his surgical wound on the lateral part of her left leg and chronic venous insufficiency ulcers medially. We put her back in compression last week. She comes in today with a complaint of 3 or 4 days worth of increasing pain. She felt her cam walker was rubbing on the area on the back of her heel. However there is intense erythema seems more likely she has cellulitis. She had 2 cultures done when she was seeing podiatry in the postop. One of them in late July showed Pseudomonas and she received a course of ciprofloxacin the other was negative on 8/11 she is allergic to penicillin with anaphylactoid complaints of hives oral swelling via information in epic 9/9; when I saw this patient last week she had intense anterior erythema around her wound on the right lateral heel and ankle and also into the right medial heel. Some of this was no doubt drainage and her walker boot however I was convinced she had cellulitis. I gave her Levaquin and Bactrim she is finishing up on this now. She is following up with Dr. [name] he saw her yesterday. He is taken her out of the walking boot of course she is still nonweightbearing. Her x-ray was negative for any worrisome features such as soft tissue air etc. Things are a lot better this week. She has home health. We have been using Hydrofera Blue under an [employer] which she put back on yesterday. I did not wrap her last week 9/17; her surrounding skin looks a lot better. In fact the area on the left lateral ankle has just a scant amount of eschar. The only remaining wound is the large area on the left medial ankle.  Probably about 60% of this is healthy granulation at the surface however she has a significant divot distally. This has adherent debris in it. I been using debridement and silver collagen to try and get this area to fill-in although I do not think we have made much progress this week 9/24; the patient's wound on the left medial ankle looks a lot better. The deeper divot area distally still requires debridement but this is cleaning up quite nicely we have been using silver collagen. The patient is complaining of swelling in her foot and is worried that that is contributing to the nonhealing of the ankle wound. She is also complaining of numbness in her anterior toes 10/4; left medial ankle. The small area distally still has a divot with necrotic material that I have been debriding away. This has an undermining area. She is approved for Apligraf. She saw Dr. [name] her surgeon on 10/1. I think he declared himself is satisfied with the condition of things. Still nonweightbearing till the end of the month. 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 Her surgical wound is well closed. There is no  evidence of infection 10/11; the patient arrived in clinic today with the expectation that we be able to put an Apligraf on this area after debridement however she arrives with a relatively large amount of green drainage on the dressing. The patient states that this started on Friday. She has not been systemically unwell. 10/19; culture I did last week showed both Enterococcus and Pseudomonas. I think this came in separate parts because I stopped her ciprofloxacin I gave her and prescribed her linezolid however now looking at the final culture result this is Pseudomonas which is resistant to quinolones. She has not yet picked up the linezolid apparently phone issues. We are also trying to get a topical antibiotic out of [facility] in [place] they can be applied by home health. She  is still having green drainage 10/16; the patient has her topical antibiotic from [facility] in [place]. This is a compounded gel with vancomycin and ciprofloxacin and gentamicin. We are applying this on the wound bed with silver alginate over the top with Unna boot wraps. She arrives in clinic today with a lot less ominous looking drainage although she is only use this topical preparation once the second time today. She sees Dr. [name] her surgeon on Friday she has home health changing the dressing 11/2; still using her compounded topical antibiotic under silver alginate. Surface is cleaning up there is less drainage. We had an Apligraf for her today and I elected to apply it. A light coating of her antibiotic 04/25/2020 upon evaluation today patient appears to be doing well with regard to her ankle ulcer. There is a little bit of slough noted on the surface of the wound I am can have to perform sharp debridement to clear this away today. With that being said other than that fact overall I feel like she is making progress and we do see some new epithelial growth. There is also some improvement in the depth of the wound and that distal portion. There is little bit of slough there as well. 12/7; 2-week follow-up. Apligraf #3. Dimensions are smaller. Closing in especially inferiorly. Still some surface debris. Still using the [facility] topical antibiotic but I told her that I don't think this needs to be renewed 12/21; 2-week follow-up. Apligraf #4 dimensions are smaller. Nice improvement 06/05/2020; 2-week follow-up. The patient's wound on the left medial ankle looks really excellent. Nice granulation. Advancing epithelialization no undermining no evidence of infection. We would have to reapply for another Apligraf but with the condition of this wound I did not feel strongly about it. We used Hydrofera Blue under the same degree of compression. She follows up with Dr. [name] her surgeon a week  Friday 06/13/2020 upon evaluation today patient appears to be doing excellent in regard to her wound. She has been tolerating the dressing changes without complication. Fortunately there is no signs of active infection at this time. No fevers, chills, nausea, vomiting, or diarrhea. She was using Hydrofera Blue last week. 06/20/2020 06/20/2020 on evaluation today patient actually appears to be doing excellent in regard to her wound. This is measuring better and looking much better as well. She has been using the collagen that seems to be doing better for her as well even though the [facility] was and is not sticking or feeling as rough on her wound. She did see Dr. [name] on Friday he is very pleased he also stated none of the hardware has shifted. That is great news 1/27; the patient has a  small clean wound all that is remaining. I agree that this is too small to really consider an Apligraf. Under illumination the surface is looking quite good. We have been using collagen although the dimensions are not any better this week 2/2; the patient has a small clean wound on the left medial ankle. Although this left of her substantial original areas. Measurements are smaller. We have been using polymen Ag under an Haematologist. 2/10; small area on the left medial ankle. This looks clean nothing to debride however dimensions are about the same we have been using polymen I think now for 2 weeks 2/17; not much change in surface area. We have been using polymen Ag without any improvement. 3/17; 1 month follow-up. The patient has been using endoform without any improvement in fact I think this looks worse with more depth and more expansion 3/24; no improvement. Perhaps less debris on the surface. We have been using Sorbact for 1 week 4/4; wound measures larger. She has edema in her leg and her foot which she tells as her wrap came down. We have been using Unna boots. Sorbact of the wound. She has been approved for  Apligraf 09/12/2020 upon evaluation today patient appears to be doing well with regard to her wound. We did get the Apligraf reapproved which is great news we have that available for application today. Fortunately there is no signs of infection and overall the patient seems to be doing great. The wound bed is nice and clean. 4/27; patient presents for her second application of Apligraf. She states over the past week she has been on her feet more often due to being outside in her garden. She has noted more swelling to her foot as a result. She denies increased warmth, pain or erythema to the wound site. 10/10/2020 upon evaluation today patient appears to be doing well with regard to her wound which does not appear to be quite as irritated as last week from what I am hearing. With that being said unfortunately she is having issues with some erythema and warmth to touch as well as an increase size. I do believe this likely is infected. 10/17/2020 upon evaluation today patient appears to be doing excellent in regard to her wound this is significantly improved as compared to last week. Fortunately I think that the infection is much better controlled at this point. She did have evidence of both Enterococcus as well as Staphylococcus noted on culture. Enterococcus really would not be helped significantly by the Cipro but the wound is doing so much better I am under the assumption that the Staphylococcus is probably the main organism that is causing the current infection. Nonetheless I think that she is doing excellent as far as that is concerned and I am very pleased in that regard. I would therefore recommend she continue with the Cipro. 10/31/2020 upon evaluation today patient appears to be doing well with regard to her wound. She has been tolerating the dressing changes without complication. Fortunately there is no signs of active infection and overall I am extremely pleased with where things stand today. No  fevers, chills, nausea, vomiting, or diarrhea. With that being said she does have some green drainage coming from the wound and although it looks okay I am a little concerned about the possibility of a continuing infection. Specifically with Pseudomonas. For that reason I will go ahead and send in a prescription for Cipro for her to be continued. 11/14/2020 upon evaluation today patient appears  to be doing very well currently in regard to her wound on her leg. She has been tolerating the dressing changes without complication. Fortunately I feel like the infection is finally under good control here. Unfortunately we do not have the Apligraf for application today although we can definitely order to have it in place for next week. That will be her fifth and final of the current series. Nonetheless I feel like her wound is really doing quite well which is great news. 11/21/2020 upon evaluation today patient appears to be doing well with regard to her wound on the medial ankle. Fortunately I think the infection is under control and I do believe we can go ahead and reapply the Apligraf today. She is in agreement with that plan. There does not appear to be any signs of active infection at this time which is great news. No fevers, chills, nausea, vomiting, or diarrhea. 12/05/2020 upon evaluation today patient's wound bed actually showed signs of good granulation epithelization at this point. There does not appear to be any signs of infection which is great news and overall very pleased with where things stand. Overall the patient seems to be doing fairly well in my opinion with regard to her wound although I do believe she continues to build up a lot of biofilm I think she could benefit from using PuraPly at this point. 12/12/2020 upon evaluation today patient's wound actually appears to be doing decently well today. The Unna boot has not been quite as well-tolerated so that more uncomfortable for her and even  causing some pressure over the plate on the lateral portion of her foot which is 90 where the wound is. There did not appear to be any significant deep tissue injury with that there may be a minimal change in the skin noted I think that we may want to go back to the Coflex 2 layer which is a little bit easier on her skin it seems. 12/19/2020 upon evaluation today patient actually seems to be making great progress with the PuraPly currently. She in fact seems to be much better as far as the overall appearance of the wound bed is concerned I am very happy in this regard. I do not see any signs of of infection which is great news as well. No fevers, chills, nausea, vomiting, or diarrhea. 12/26/2020 upon evaluation today patient appears to actually be doing better in regard to her wound on the left medial ankle region. The surface of the wound is actually doing significantly better which is great news. There does not appear to be any signs of infection which is also great news and in general I am extremely pleased with where we stand today. 01/02/2021 upon evaluation today patient appears to be doing well with regard to her wound. In fact this is showing signs of excellent improvement and very pleased with where things stand. In fact the last 3 appointments have all shown signs of this getting smaller which is excellent news. I have not even had to perform any debridement and today is no exception. Overall I feel like this is dramatically improved compared to previous. T oday is PuraPly application #4. [id number] upon evaluation today patient appears to be doing excellent in regard to her wound this is continue to show signs of improvement and overall I am extremely pleased with where we stand today. She is actually here for PuraPly application #5. Every time we have applied this she is noted definite improvement on measurements. 01/23/2021  upon evaluation today patient is actually making good progress in  regard to her wound. This was actually on just a little bit longer this time compared to previous due to the fact that she did have to go out of town. She is actually here for PuraPly application #6. We have definitely been seeing improvements in the overall quality of the tissue on the surface of the wound which is awesome news. In general I think that the patient seems to be continuing to make great progress here. 01/30/2021 upon evaluation today patient's wound is actually doing excellent. There is really not any significant biofilm buildup which is great news and overall I am extremely pleased with where things stand today. There does not appear to be any signs of active infection. No fevers, chills, nausea, vomiting, or diarrhea. 02/06/2021 upon evaluation today patient's wound is actually showing signs of excellent improvement which is great news. We continue to see the benefit of the PuraPly this is doing a great job the wound seems not really irritated whatsoever and is showing signs of good granulation at this point. Overall I am extremely happy with what we are seeing. The patient likewise is happy to hear all of this as well. 02/13/2021 upon evaluation today patient appears to be doing well with regard to her wound. This again is measuring smaller today and I am very pleased in this regard. Fortunately there does not appear to be any signs of active infection at this time which is good news from a systemic standpoint. Locally there is still some significant drainage which she does have is concerned about infection locally. No fevers, chills, nausea, vomiting, or diarrhea. 02/20/2021 upon evaluation today patient actually appears to be doing decently well in regard to her wound. She has been tolerating the dressing changes without complication. I do believe the PuraPly is helping wound bed does appear to be doing better. There is no evidence of active infection locally or systemically at this  point visually although on fluorescence imaging there still appear to be bacterial load and bioburden noted. Electronic Signature(s) Signed: 02/20/2021 11:35:31 AM By: [name] PA-C Entered By: [name] on 02/20/2021 11:35:31 -------------------------------------------------------------------------------- Physical Exam Details Patient Name: Date of Service: [facility] [name], ELEA NO R [name]. 02/20/2021 10:00 A M Medical Record Number: [id number] Patient Account Number: [id number] Date of Birth/Sex: Treating RN: 1955-10-31 (65 y.o. [name] Primary Care Provider: [name] Other Clinician: Referring Provider: Treating Provider/Extender: [name] in Treatment: 48 Constitutional Well-nourished and well-hydrated in no acute distress. Respiratory normal breathing without difficulty. Psychiatric this patient is able to make decisions and demonstrates good insight into disease process. Alert and Oriented x 3. pleasant and cooperative. Notes Upon inspection patient's wound bed actually showed signs of good granulation epithelization at this point. I am actually pleased with where things stand I think we are headed in appropriate direction here. I do not see any signs of active infection systemically and even locally though the fluorescence imaging did show signs of red and blush fluorescence I do not see this with the naked eye. I do believe that nonetheless there may be a bioburden level that is at least at risk for causing this complication here. I would have her continue the Bactrim we did actually do a PCR screening today to see what was shown in that regard. We will see the results of that and make any adjustments necessary as far as her treatment is concerned.  Electronic Signature(s) Signed: 02/20/2021 11:36:11 AM By: [name] PA-C Entered By: [name] on 02/20/2021  11:36:11 -------------------------------------------------------------------------------- Physician Orders Details Patient Name: Date of Service: [name], ELEA NO R [name]. 02/20/2021 10:00 [place] Record Number: [id number] Patient Account Number: [id number] Date of Birth/Sex: Treating RN: [birth year] (65 y.o. [name] Primary Care Provider: [name] Other Clinician: Referring Provider: Treating Provider/Extender: [name] in Treatment: 24 Verbal / Phone Orders: No Diagnosis Coding ICD-10 Coding Code Description [phone] Chronic venous hypertension (idiopathic) with ulcer and inflammation of left lower extremity L97.828 Non-pressure chronic ulcer of other part of left lower leg with other specified severity L97.328 Non-pressure chronic ulcer of left ankle with other specified severity Follow-up Appointments ppointment in 1 week. - with Margarita Grizzle Return A Cellular or Tissue Based Products Cellular or Tissue Based Product Type: - puraply AM#10 daptic or Mepitel. (DO NOT REMOVE). - no Cellular or Tissue Based Product applied to wound bed, secured with steri-strips, cover with A steristrips Bathing/ Shower/ Hygiene May shower with protection but do not get wound dressing(s) wet. Edema Control - Lymphedema / SCD / Other Elevate legs to the level of the heart or above for 30 minutes daily and/or when sitting, a frequency of: - throughout the day Avoid standing for long periods of time. Exercise regularly Compression stocking or Garment 20-30 mm/Hg pressure to: - right leg daily Additional Orders / Instructions Follow Nutritious Diet Wound Treatment Wound #15 - Malleolus Wound Laterality: Left, Medial Cleanser: Soap and Water 1 x Per Week/30 Days Discharge Instructions: May shower and wash wound with dial antibacterial soap and water prior to dressing change. Peri-Wound Care: Triamcinolone 15 ([name]) 1 x Per Week/30 Days Discharge Instructions: Use  triamcinolone 15 ([name]) as directed Peri-Wound Care: Sween Lotion (Moisturizing lotion) 1 x Per Week/30 Days Discharge Instructions: Apply moisturizing lotion as directed Prim Dressing: PuraPly AM ary 1 x Per Week/30 Days Secondary Dressing: Woven Gauze Sponge, Non-Sterile 4x4 in 1 x Per Week/30 Days Discharge Instructions: to bolster skin sub Secondary Dressing: ABD Pad, 5x9 1 x Per Week/30 Days Discharge Instructions: Apply over primary dressing and lateral ankle Secondary Dressing: Optifoam Non-Adhesive Dressing, 4x4 in 1 x Per Week/30 Days Discharge Instructions: Apply over primary dressing pad lateral foot with foam donut Secondary Dressing: ADAPTIC TOUCH 3x4.25 in 1 x Per Week/30 Days Discharge Instructions: Apply over skin sub Compression Wrap: CoFlex TLC XL 2-layer Compression System 4x7 (in/yd) 1 x Per Week/30 Days Discharge Instructions: Apply CoFlex 2-layer compression as directed. (alt for 4 layer) Laboratory naerobe culture (MICRO) Bacteria identified in Unspecified specimen by A LOINC Code: [id number] Convenience Name: Anerobic culture Electronic Signature(s) Signed: 02/20/2021 5:43:52 PM By: Baruch Gouty RN, BSN Signed: 02/21/2021 2:21:00 PM By: [name] PA-C Entered By: Baruch Gouty on 02/20/2021 16:15:04 -------------------------------------------------------------------------------- Problem List Details Patient Name: Date of Service: [name], ELEA NO R [name]. 02/20/2021 10:00 [place] Record Number: [id number] Patient Account Number: [id number] Date of Birth/Sex: Treating RN: Sep 11, 1955 (65 y.o. 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 02/20/2021 10:00 A M Medical Record Number: [id number] Patient Account Number: [id number] Date of Birth/Sex: Treating RN: 06/08/1955 (65 y.o. [name] Primary Care Provider: [name] Other Clinician: Referring Provider: Treating Provider/Extender: [name] in Treatment: 14 Subjective Chief Complaint Information obtained from Patient patient is been followed long-term in this clinic for venous insufficiency ulcers with inflammation, hypertension and ulceration over the medial ankle bilaterally. 01/17/2020; this is a patient who is here for review of postoperative wounds on the left lateral ankle and recurrence of venous stasis ulceration on the left medial History of Present Illness (HPI) the remaining wound is over the left medial ankle. Similar wound over the right medial ankle healed largely with use of Apligraf. Most recently we have been using Hydrofera Blue over this wound with considerable improvement. The patient has been extensively worked up in the past for her venous insufficiency and she is not a candidate for antireflux surgery although I have none of the details  available currently. 08/24/14; considerable improvement today. About 50% of this wound areas now epithelialized. The base of the wound appears to be healthier granulation.as opposed to last week when she had deteriorated a considerable improvement 08/17/14; unfortunately the wound has regressed somewhat. The areas of epithelialization from the superior aspect are not nearly as healthy as they were last week. The patient thinks her Hydrofera Blue slipped. 09/07/14; unfortunately the area has markedly regressed in the 2 weeks since I've seen this. There is an odor surrounding erythema. The healthy granulation tissue that we had at the base of the wound now is a dusky color. The nurse reports green drainage 09/14/14; the area looks somewhat better than last week. There is less erythema and less drainage. The culture I did did not show any growth. Nevertheless I think it is better to continue the Cipro and doxycycline for a further week. The remaining wound area was debridement. 09/21/14. Wound did not require debridement last week. Still less erythema and less drainage. She can complete her antibiotics. The areas of epithelialization in the superior aspect of the wound do not look as healthy as they did some weeks ago 10/05/14 continued improvement in the condition of this wound. There is advancing epithelialization. Less aggressive debridement required 10/19/14 continued improvement in the condition and volume of this wound. Less aggressive debridement to the inferior part of this to remove surface slough and fibrinous eschar 11/02/14 no debridement is required. The surface granulation appears healthy although some of her islands of epithelialization seem to have regressed. No evidence of infection 11/16/14; lites surface debridement done of surface eschar. The wound does not look to be unhealthy. No evidence of infection. Unfortunately the patient has had podiatry issues in the right foot and for some reason has  redeveloped small surface ulcerations in the medial right ankle. Her original presentation involved wounds in this area 11/23/14 no debridement. The area on the right ankle has enlarged. The left ankle wound appears stable in terms of the surface although there is periwound inflammation. There has been regression in the amount of new skin  11/30/14 no debridement. Both wound areas appear healthy. There was no evidence of infection. The the new area on the right medial ankle has enlarged although that both the surfaces appear to be stable. 12/07/14; Debridement of the right medial ankle wound. No no debridement was done on the left. 12/14/14 no major change in and now bilateral medial ankle wounds. Both of these are very painful but the no overt evidence of infection. She has had previous venous ablation 12/21/14; patient states that her right medial ankle wound is considerably more painful last week than usual. Her left is also somewhat painful. She could not tolerate debridement. The right medial ankle wound has fibrinous surface eschar 12/28/14 this is a patient with severe bilateral venous insufficiency ulcers. For a considerable period of time we actually had the one on the right medial ankle healed however this recently opened up again in June. The left medial ankle wound has been a refractory area with some absent flows. We had some success with Hydrofera Blue on this area and it literally closed by 50% however it is recently opened up Foley. Both of these were debridement today of surface eschar. She tolerates this poorly 01/25/15: No change in the status of this. Thick adherent escar. Very poor tolerance of any attempt at debridement. I had healed the right medial malleolus wound for a considerable amount of time and had the left one down to about 50% of the volume although this is totally regressed over the last 48 weeks. Further the right leg has reopened. she is trying to make a appointment with  pain and vascular, previous ablations with Dr. Aleda Grana. I do not believe there is an arterial insufficiency issue here 02/01/15 the status of the adherent eschar bilaterally is actually improved. No debridement was done. She did not manage to get vascular studies done 02/08/15 continued debridement of the area was done today. The slough is less adherent and comes off with less pressure. There is no surrounding infection peripheral pulses are intact 02/15/15 selective debridement with a disposable curette. Again the slough is less adherent and comes off with less difficulty. No surrounding infection peripheral pulses are intact. 02/22/15 selective debridement of the right medial ankle wound. Slough comes off with less difficulty. No obvious surrounding infection peripheral pulses are intact I did not debridement the one on the left. Both of these are stable to improved 03/01/15 selective debridement of both wound areas using a curette to. Adherent slough cup soft with less difficulty. No obvious surrounding infection. The patient tells me that 2 days ago she noted a rash above the right leg wrap. She did not have this on her lower legs when she change this over she arrives with widespread left greater than right almost folliculitis-looking rash which is extremely pruritic. I don't see anything to culture here. There is no rash on the rest of her body. She feels well systemically. 03/08/15; selective debridement of both wounds using a curette. Base of this does not look unhealthy. She had limegreen drainage coming out of the left leg wound and describes a lot of drainage. The rash on her left leg looks improved to. No cultures were done. 03/22/15; patient was not here last week. Basal wounds does not look healthy and there is no surrounding erythema. No drainage. There is still a rash on the left leg that almost looks vasculitic however it is clearly limited to the top of where the wrap would  be. 04/05/15; on the right required a  surgical debridement of surface eschar and necrotic subcutaneous tissue. I did not debridement the area on the left. These continue to be large open wounds that are not changing that much. We were successful at one point in healing the area on the right, and at the same time the area on the left was roughly half the size of current measurements. I think a lot of the deterioration has to do with the prolonged time the patient is on her feet at work 04/19/15 I attempted-like surface debridement bilaterally she does not tolerate this. She tells me that she was in allergic care yesterday with extreme pain over her left lateral malleolus/ankle and was told that she has an "sprain" 05/03/15; large bilateral venous insufficiency wounds over the medial malleolus/medial aspect of her ankles. She complains of copious amounts of drainage and his usual large amounts of pain. There is some increasing erythema around the wound on the right extending into the medial aspect of her foot to. historically she came in with these wounds the right one healed and the left one came down to roughly half its current size however the right one is reopened and the left is expanded. 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 05/10/15 large bilateral venous insufficiency wounds. There is less adherence surface left however the surface culture that I did last week grew pseudomonas therefore bilateral selective debridement score necessary. There is surrounding erythema. The patient describes severe bilateral drainage and a lot of pain in the left ankle. Apparently her podiatrist was were ready to do a cortisone shot 05/17/15; the patient complains of pain and again copious amounts of drainage. 05/24/15; we used Iodo flex last week. Patient notes considerable improvement in wound drainage. Only needed to change this once. 05/31/15; we continued  Iodoflex; the base of these large wounds bilaterally is not too bad but there is probably likely a significant bioburden here. I would like to debridement just doesn't tolerate it. 06/06/14 I would like to continue the Iodoflex although she still hasn't managed to obtain supplies. She has bilateral medial malleoli or large wounds which are mostly superficial. Both of them are covered circumferentially with some nonviable fibrinous slough although she tolerates debridement very poorly. She apparently has an appointment for an ablation on the right leg by interventional radiology. 06/14/15; the patient arrives with the wounds and static condition. We attempted a debridement although she does not do well with this secondary to pain. I 07/05/15; wounds are not much smaller however there appears to be a cleaner granulating base. The left has tight fibrinous slough greater than the right. Debridement is tolerated poorly due to pain. Iodoflex is done more for these wounds in any of the multitude of different dressings I have tried on the left 1 and then subsequently the right. 07/12/15; no change in the condition of this wound. I am able to do an aggressive debridement on the right but not the left. She simply cannot tolerate it. We have been using Iodoflex which helps somewhat. It is worthwhile remembering that at one point we healed the right medial ankle wound and the left was about 25% of the current circumference. We have suggested returning to vascular surgery for review of possible further ablations for one reason or another she has not been able to do this. 07/26/15 no major change in the condition of either wound on her medial ankle. I did not attempt  to debridement of these. She has been aggressively scrubbing these while she is in the shower at home. She has her supply of Iodoflex which seems to have done more for these wounds then anything I have put on recently. 08/09/15 wound area appears larger  although not verified by measurements. Using Iodoflex 09/05/2015 -- she was here for avisit today but had significant problems with the wound and I was asked to see her for a physician opinion. I have summarize that this lady has had surgery on her left lower extremity about 10 years ago where the possible veins stripping was done. She has had an opinion from interventional radiology around November 2016 where no further sclerotherapy was ordered. The patient works 12 hours a day and stands on a concrete floor with work boots and is unable to get the proper compression she requires and cannot elevate her limbs appropriately at any given time. She has recently grown Pseudomonas from her wound culture but has not started her ciprofloxacin which was called in for her. 09/13/15 this continues to be a difficult situation for this patient. At one point I had this wound down to a 1.5 x 1.5" wound on her left leg. This is deteriorated and the right leg has reopened. She now has substantial wounds on her medial calcaneus, malleoli and into her lower leg. One on the left has surface eschar but these are far too painful for me to debridement here. She has a vascular surgery appointment next week to see if anything can be done to help here. I think she has had previous ablations several years ago at [facility]. She has no major edema. She tells me that she did not get product last time [facility] Ag] and went for several days without it. She continues to work in work boots 12 hours a day. She cannot get compression/4-layer under her work boots. 09/20/15 no major change. Periwound edema control was not very good. Her point with pain and vascular is next Wednesday the 25th 09/28/15; the patient is seen vascular surgery and is apparently scheduled for repeat duplex ultrasounds of her bilateral lower legs next week. 10/05/15; the patient was seen by Dr. Doren Custard of vascular surgery. He feels that she should have arterial  insufficiency excluded as cause/contributed to her nonhealing stage she is therefore booked for an arteriogram. She has apparently monophasic signals in the dorsalis pedis pulses. She also of course has known severe chronic venous insufficiency with previous procedures as noted previously. I had another long discussion with the patient today about her continuing to work 12 hour shifts. I've written her out for 2 months area had concerns about this as her work location is currently undergoing significant turmoil and this may lead to her termination. She is aware of this however I agree with her that she simply cannot continue to stand for 12 hours multiple days a week with the substantial wound areas she has. 10/19/15; the Dr. Doren Custard appointment was largely for an arteriogram which was normal. She does not have an arterial issue. He didn't make a comment about her chronic venous insufficiency for which she has had previous ablations. Presumably it was not felt that anything additional could be done. The patient is now out of work as I prescribed 2 weeks ago. Her wounds look somewhat less aggravated presumably because of this. I felt I would give debridement another try today 10/25/15; no major change in this patient's wounds. We are struggling to get her product that  she can afford into her own home through her insurance. 11/01/15; no major change in the patient's wounds. I have been using silver alginate as the most affordable product. I spoke to Dr. Marla Roe last week with her requested take her to the OR for surgical debridement and placement of ACEL. Dr. Marla Roe told me that she would be willing to do this however [facility] will not cover this, fortunately the patient has [place] healthcare of some variant 11/08/15; no major change in the patient's wounds. She has been completely nonviable surface that this but is in too much pain with any attempted debridement are clinic. I have arranged  for her to see Dr. Marla Roe ham of plastic surgery and this appointment is on Monday. I am hopeful that they will take her to the OR for debridement, possible ACEL ultimately possible skin graft 11/22/15 no major change in the patient's wounds over her bilateral medial calcaneus medial malleolus into the lower legs. Surface on these does not look too bad however on the left there is surrounding erythema and tenderness. This may be cellulitis or could him sleepy tinea. 11/29/15; no major changes in the patient's wounds over her bilateral medial malleolus. There is no infection here and I don't think any additional antibiotics are necessary. There is now plan to move forward. She sees Dr. Marla Roe in a week's time for preparation for operative debridement and ACEL placement I believe on 7/12. She then has a follow-up appointment with Dr. Marla Roe on 7/21 12/28/15; the patient returns today having been taken to the [facility] by Dr. Marla Roe 12/12/15 she underwent debridement, intraoperative cultures [which were negative]. She had placement of a wound VAC. Parent really ACEL was not available to be placed. The wound VAC foam apparently adhered to the wound since then she's been using silver alginate, Xeroform under Ace wraps. She still says there is a lot of drainage and a lot of pain 01/31/16; this is a patient I see monthly. I had referred her to Dr. Marla Roe him of plastic surgery for large wounds on her bilateral medial ankles. She has been to the OR twice once in early July and once in early August. She tells me over the last 3 weeks she has been using the wound VAC with ACEL underneath it. On the right we've simply been using silver alginate. Under Kerlix Coban wraps. 02/28/16; this is a patient I'm currently seeing monthly. She is gone on to have a skin graft over her large venous insufficiency ulcer on the left medial ankle. This was done by Dr. Marla Roe him. The patient is a bit perturbed about why  she didn't have one on her right medial ankle wound. She has been using silver alginate to this. 03/06/16; I received a phone call from her plastic surgery Dr. Marla Roe. She expressed some concern about the viability of the skin graft she did on the left medial ankle wound. Asked me to place Endoform on this. She told me she is not planning to do a subsequent skin graft on the right as the left one did not take very well. I had placed Hydrofera Blue on the right 03/13/16; continue to have a reasonably healthy wound on the right medial ankle. Down to 3 mm in terms of size. There is epithelialization here. The area on the left medial ankle is her skin graft site. I suppose the last week this looks somewhat better. She has an open area inferiorly however in the center there appears to  be some viable tissue. There is a lot of surface callus and eschar that will eventually need to come off however none of this looked to be infected. Patient states that the is able to keep the dressing on for several days which is an improvement. 03/20/16 no major change in the circumference of either wound however on the left side the patient was at Dr. Eusebio Friendly office and they did a debridement of left wound. 50% of the wound seems to be epithelialized. I been using Endoform on the left Hydrofera Blue in the right 03/27/16; she arrives today with her wound is not looking as healthy as they did last week. The area on the right clearly has an adherent surface to this a very similar surface on the left. Unfortunately for this patient this is all too familiar problem. Clearly the Endoform is not working and will need to change that today that has some potential to help this surface. She does not tolerate debridement in this clinic very well. She is changing the dressing wants 04/03/16; patient arrives with the wounds looking somewhat better especially on the right. Dr. Migdalia Dk change the dressing to silver alginate when she  saw her on Monday and also sold her some compression socks. The usefulness of the latter is really not clear and woman with severely draining wounds. 04/10/16; the patient is doing a bit of an experiment wearing the compression stockings that Dr. Migdalia Dk provided her to her left leg and the out of legs based dressings that we provided to the right. 05/01/16; the patient is continuing to wear compression stockings Dr. Migdalia Dk provided her on the left that are apparently silver impregnated. She has been using Iodoflex to the right leg wound. Still a moderate amount of drainage, when she leaves here the wraps only last for 4 days. She has to change the stocking on the left leg every night 05/15/16; she is now using compression stockings bilaterally provided by Dr. Marla Roe. She is wearing a nonadherent layer over the wounds so really I don't think there is anything specific being done to this now. She has some reduction on the left wound. The right is stable. I think all healing here is being done without a specific dressing 06/09/16; patient arrives here today with not much change in the wound certainly in diameter to large circular wounds over the medial aspect of her ankle bilaterally. Under the light of these services are certainly not viable for healing. There is no evidence of surrounding infection. She is wearing compression stockings with some sort of silver impregnation as prescribed by Dr. Marla Roe. She has a follow-up with her tomorrow. 06/30/16; no major change in the size or condition of her wounds. These are still probably covered with a nonviable surface. She is using only her purchase stockings. She did see Dr. Marla Roe who seemed to want to apply Dakin's solution to this I'm not extreme short what value this would be. I would suggest Iodoflex which she still has at home. 07/28/16; I follow Mrs. Teixeira episodically along with Dr. Marla Roe. She has very refractory venous insufficiency  wounds on her bilateral medial legs left greater than right. She has been applying a topical collagen ointment to both wounds with Adaptic. I don't think Dr. Marla Roe is planning to take her back to the OR. 08/19/16; I follow Mrs. Jeneen Rinks on a monthly basis along with Dr. Marla Roe of plastic surgery. She has very refractory venous insufficiency wounds on the bilateral medial lower legs left greater  than right. I been following her for a number of years. At one point I was able to get the right medial malleolus wound to heal and had the left medial malleolus down to about half its current size however and I had to send her to plastic surgery for an operative debridement. Since then things have been stable to slightly improve the area on the right is slightly better one in the left about the same although there is much less adherent surface than I'm used to with this patient. She is using some form of liquid collagen gel that Dr. Marla Roe provided a Kerlix cover with the patient's own pressure stockings. She tells me that she has extreme pain in both ankles and along the lateral aspect of both feet. She has been unable to work for some period of time. She is telling me she is retiring at the beginning of April. She sees Dr. Doran Durand of orthopedics next week 09/22/16; patient has not seen Dr. Marla Roe since the last time she is here. I'm not really sure what she is using to the wounds other than bits and pieces of think she had left over including most recently Hydrofera Blue. She is using juxtalite stockings. She is having difficulty with her husband's recent illness "stroke". She is having to transport him to various doctors appointments. Dr. Marla Roe left her the option of a repeat debridement with ACEL however she has not been able to get the time to follow-up on this. She continues to have a fair amount of drainage out of these wounds with certainly precludes leaving dressings on all  week 10/13/16; patient has not seen Dr. Marla Roe since she was last in our clinic. I'm not really sure what she is doing with the wounds, we did try to get her [facility] and I think she is actually using this most of the time. Because of drainage she states she has to change this every second day although this is an improvement from what she used to do. She went to see Dr. Doran Durand who did not think she had a muscular issue with regards to her feet, he referred her to a neurologist and I think the appointment is sometime in June. I changed her back to Iodoflex which she has used in the past but not recently. 11/03/16; the patient has been using Iodoflex although she ran out of this. Still claims that there is a lot of drainage although the wound does not look like this. No surrounding erythema. She has not been back to see Dr. Marla Roe 11/24/16; the patient has been using Iodoflex again but she ran out of it 2 or 3 days ago. There is no major change in the condition of either one of these wounds in fact they are larger and covered in a thick adherent surface slough/nonviable tissue especially on the left. She does not tolerate mechanical debridement in our clinic. Going back to see Dr. Marla Roe of plastic surgery for an operative debridement would seem reasonable. 12/15/16; the patient has not been back to see Dr. Marla Roe. She is been dealing with a series of illnesses and her husband which of monopolized her time. She is been using Sorbact which we largely supplied. She states the drainage is bad enough that it maximum she can go 2-3 days without changing the dressing 01/12/2017 -- the patient has not been back for about 4 weeks and has not seen Dr. Marla Roe not does she have any appointment pending. 01/23/17; patient has not  seen Dr. Marla Roe even though I suggested this previously. She is using Santyl that was suggested last week by Dr. Con Memos this Cost her $16 through her insurance which  is indeed surprising 02/12/17; continuing Santyl and the patient is changing this daily. A lot of drainage. She has not been back to see plastic surgery she is using an Ace wrap. Our intake nurse suggested wrap around stockings which would make a good reasonable alternative 02/26/17; patient is been using Santyl and changing this daily due to drainage. She has not been to see plastic surgery she uses in April Ace wrap to control the edema. She did obtain extremitease stockings but stated that the edema in her leg was to big for these 03/20/17; patient is using Santyl and Anasept. Surfaces looked better today the area on the right is actually measuring a little smaller. She has states she has a lot of pain in her feet and ankles and is asking for a consult to pain control which I'll try to help her with through our case manager. 04/10/17; the patient arrives with better-looking wound surfaces and is slightly smaller wound on the left she is using a combination of Santyl and Anasept. She has an appointment or at least as started in the pain control center associated with [facility] 05/14/17; this is a patient who I followed for a prolonged period of time. She has venous insufficiency ulcers on her bilateral medial ankles. At one point I had this down to a much smaller wound on the left however these reopened and we've never been able to get these to heal. She has been using Santyl and Anasept gel although 2 weeks ago she ran out of the Anasept gel. She has a stable appearance of the wound. She is going to the wound care clinic at [facility]. They wanted do a nerve block/spinal block although she tells me she is reluctant to go forward with that. 05/21/17; this is a patient I have followed for many years. She has venous insufficiency ulcers on her bilateral medial ankles. Chronic pain and deformity in her ankles as well. She is been to see plastic surgery as well as orthopedics. Using PolyMem  AG most recently/Kerramax/ABDs and 2 layer compression. She has managed to keep this on and she is coming in for a nurse check to change the dressing on Tuesdays, we see her on Fridays 06/05/17; really quite a good looking surface and the area especially on the right medial has contracted in terms of dimensions. Well granulated healthy-looking tissue on both sides. Even with an open curet there is nothing that even feels abnormal here. This is as good as I've seen this in quite some time. We have been using PolyMem AG and bringing her in for a nurse check 06/12/17; really quite good surface on both of these wounds. The right medial has contracted a bit left is not. We've been using PolyMem and AG and she is coming in for a nurse visit 06/19/17; we have been using PolyMem AG and bringing her in for a nurse check. Dimensions of her wounds are not better but the surfaces looked better bilaterally. She complained of bleeding last night and the left wound and increasing pain bilaterally. She states her wound pain is more neuropathic than just the wounds. There was some suggestion that this was radicular from her pain management doctor in talking to her it is really difficult to sort this out. 06/26/17; using PolyMem and AG and bringing her  in for a nurse check as All of this and reasonably stable condition. Certainly not improved. The dimensions on the lateral part of the right leg look better but not really measuring better. The medial aspect on the left is about the same. 07/03/16; we have been using PolyMen AG and bringing her in for a nurse check to change the dressings as the wounds have drainage which precludes once weekly changing. We are using all secondary absorptive dressings.our intake nurse is brought up the idea of using a wound VAC/snap VAC on the wound to help with the drainage to see if this would result in some contraction. This is not a bad idea. The area on the right medial is actually looking  smaller. Both wounds have a reasonable-looking surface. There is no evidence of cellulitis. The edema is well controlled 07/10/17; the patient was denied for a snap VAC by her insurance. The major issue with these wounds continues to be drainage. We are using wicked PolyMem AG and she is coming in for a nurse visit to change this. The wounds are stable to slightly improved. The surface looks vibrant and the area on the right certainly has shrunk in size but very slowly 07/17/17; the patient still has large wounds on her bilateral medial malleoli. Surface of both of these wounds looks better. The dimensions seem to come and go but no consistent improvement. There is no epithelialization. We do not have options for advanced treatment products due to insurance issues. They did not approve of the wound VAC to help control the drainage. More recently we've been using PolyMem and AG wicked to allow drainage through. We have been bringing her in for a nurse visit to change this. We do not have a lot of options for wound care products and the home again due to insurance issues 07/24/17; the patient's wound actually looks somewhat better today. No drainage measurements are smaller still healthy-looking surface. We used silver collagen under PolyMen started last week. We have been bringing her in for a dressing change 07/31/17; patient's wound surface continued to look better and I think there is visible change in the dimensions of the wound on the right. Rims of epithelialization. We have been using silver collagen under PolyMen and bringing her in for a dressing change. There appears to be less drainage although she is still in need of the dressing change 08/07/17. Patient's wound surface continues to look better on both sides and the area on the right is definitely smaller. We have been using silver collagen and PolyMen. She feels that the drainage has been it has been better. I asked her about her vascular status.  She went to see Dr. Aleda Grana at [facility] and had some form of ablation. I don't have much detail on this. I haven't my notes from 2016 that she was not a candidate for any further ablation but I don't have any more information on this. We had referred her to vein and vascular I don't think she ever went. He does not have a history of PAD although I don't have any information on this either. We don't even have ABIs in our record 08/14/17; we've been using silver collagen and PolyMen cover. And putting the patient and compression. She we are bringing her in as a nurse visit to change this because ofarge amount of drainage. We didn't the ABIs in clinic today since they had been done in many moons 1.2 bilaterally. She has been to see vein and  vascular however this was at [facility] and she had ablation although I really don't have any information on this all seemed biking get a report. She is also been operatively debrided by plastic surgery and had a cell placed probably 8-12 months ago. This didn't have a major effect. We've been making some gains with current dressings 08/19/17-She is here in follow-up evaluation for bilateral medial malleoli ulcers. She continues to tolerate debridement very poorly. We will continue with recently changed topical treatment; if no significant improvement may consider switching to Iodosorb/Iodoflex. 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 She has been using silver collagen and PolyMem. I believe she has been used and tried on Iodoflex before. 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 These have been very refractory over time. We have been using silver collagen and PolyMen. She is coming in once a week for a doctor's and  once a week for nurse visits. We are actually making some progress 09/18/17; the patient's wounds are smaller especially on the right medial. She arrives today to upset to consider even washing these off with Anasept which I think is been part of the reason this is been closing. We've been using collagen covered in PolyMen otherwise. It is noted that she has a small area of folliculitis on the right medial calf that. As we are wrapping her legs I'll give her a short course of doxycycline to make sure this doesn't amount to anything. She is a long list of complaints today including imbalance, shortness of breath on exertion, inversion of her left ankle. With regards to the latter complaints she is been to see orthopedics and they offered her a tendon release surgery I believe but wanted her wounds to be closed first. I have recommended she go see her primary physician with regards to everything else. 09/25/17; patient's wounds are about the same size. We have made some progress bilaterally although not in recent weeks. She will not allow me T wash these wounds with Anasept even if she is doing her cell. Wheeze we've been using collagen covered in PolyMen. Last week she had a small area of folliculitis this is now opened into a small wound. She completed 5 days of trimethoprim sulfamethoxazole 10/02/17; unfortunately the area on her left medial ankle is worse with a larger wound area towards the Achilles. The patient complains of a lot of pain. She will not allow debridement although visually I don't think there is anything to debridement in any case. We have been using silver collagen and PolyMen for several months now. Initially we are making some progress although I'm not really seeing that today. We will move back to [facility]. His admittedly this is a bit of a repeat however I'm hoping that his situation is different now. The patient tells me she had her leg on the left give out on her yesterday  this is process some pain. 10/09/17; the patient is seen twice a week largely because of drainage issues coming out of the chronic medial bimalleolar wounds that are chronic. Last week the dimensions of the one on the left looks a little larger I changed her to [facility]. She comes in today with a history of terrible pain in the bilateral wound areas. She will not allow  debridement. She will not even allow a tissue culture. There is no surrounding erythema no no evidence of cellulitis. We have been putting her Kerlix Coban man. She will not allow more aggressive compression as there was a suggestion to put her in 3 layer wraps. 10/16/17; large wounds on her bilateral medial malleoli. These are chronic. Not much change from last week. The surface looks have healthy but absolutely no epithelialization. A lot of pain little less so of drainage. She will not allow debridement or even washing these off in the vigorous fashion with Anasept. 10/23/17; large wounds on her bilateral malleoli which are chronic. Some improvement in terms of size perhaps on the right since last time I saw these. She states that after we increased the 3 layer compression there was some bleeding, when she came in for a nurse visit she did not want 3 layer compression put back on about our nurse managed to convince her. She has known chronic venous visit issues and I'm hoping to get her to tolerate the 3 layer compression. using Hydrofera Blue 10/30/17; absolutely no change in the condition of either wound although we've had some improvement in dimensions on the right.. Attempted to put her in 3 layer compression she didn't tolerated she is back in 2 layer compression. We've been using Hydrofera Blue We looked over her past records. She had venous reflux studies in November 2016. There was no evidence of deep venous reflux on the right. Superficial vein did not show the greater saphenous vein at think this is been previously ablated  the small saphenous vein was within normal limits. The left deep venous system showed no DVT the vessels were positive for deep venous reflux in the posterior tibial veins at the ankle. The greater saphenous vein was surgically absent small saphenous vein was within normal limits. She went to vein and vascular at [facility]. I believe she had an ablation on the left greater saphenous vein. I'll update her reflux studies perhaps ever reviewed by vein and vascular. We've made absolutely no progress in these wounds. Will also try to read and TheraSkins through her insurance 11/06/17; W the patient apparently has a 2 week follow-up with vein and vascular I like him to review the whole issue with regards to her previous vascular workup by Dr. Aleda Grana. We've really made no progress on these wounds in many months. She arrives today with less viable looking surface on the left medial ankle wound. This was apparently looking about the same on Tuesday when she was here for nurse visit. 11/13/17; deep tissue culture I did last time of the left lower leg showed multiple organisms without any predominating. In particular no Staphylococcus or group A strep were isolated. We sent her for venous reflux studies. She's had a previous left greater saphenous vein stripping and I think sclerotherapy of the right greater saphenous vein. She didn't really look at the lesser saphenous vein this both wounds are on the medial aspect. She has reflux in the common femoral vein and popliteal vein and an accessory vein on the right and the common femoral vein and popliteal vein on the left. I'm going to have her go to see vein and vascular just the look over things and see if anything else beside aggressive compression is indicated here. We have not been able to make any progress on these wounds in spite of the fact that the surface of the wounds is never look too bad. 11/20/17; no major change in  the condition of the wounds.  Patient reports a large amount of drainage. She has a lot of complaints of pain although enlisting her today I wonder if some of this at least his neuropathic rather than secondary to her wounds. She has an appointment with vein and vascular on 12/30/17. The refractory nature of these wounds in my mind at least need vein and vascular to look over the wounds the recent reflux studies we did and her history to see if anything further can be done here. I also note her gait is deteriorated quite a bit. 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 She has a bilateral Trendelenburg gait. I wonder if this is neuropathic or perhaps multilevel radicular. 11/27/17; her wounds actually looks slightly better. Healthy-looking granulation tissue a scant amount of epithelialization. [place] [facility] will not pay for [employer]. They will play for tri layer Oasis and Dermagraft. This is not a diabetic ulcer. We'll try for the tri layer Oasis. She still complains of some drainage. She has a vein and vascular appointment on 12/30/17 12/04/17; the wounds visually look quite good. Healthy-looking granulation with some degree of epithelialization. We are still waiting for response to our request for trial to try layer Oasis. Her appointment with vascular to review venous and arterial issues isn't sold the end of July 7/31. Not allow debridement or even vigorous cleansing of the wound surface. 12/18/17; slightly smaller especially on the right. 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 We've been using Hydrofera Blue. We still are looking into triple layer Oasis through her insurance 01/08/18 on evaluation today patient's wound actually appears to be showing signs of good improvement at this point in time. She has been tolerating the dressing changes without complication. Fortunately there does not appear to be any evidence of infection at this point in time. We have been utilizing  silver nitrate which does seem to be of benefit for her which is also good news. Overall I'm very happy with how things seem to be both regards appearance as well as measurement. Patient did see Dr. Bridgett Larsson for evaluation on 12/30/17. In his assessment he felt that stripping would not likely add much more than chronic compression to the patient's healing process. His recommendation was to follow-up in three months with Dr. Doren Custard if she hasn't healed in order to consider referral back to you and see vascular where she previously was in a trial and was able to get her wound to heal. I'll be see what she feels she when you staying compression and he reiterated this as well. 01/13/18 on evaluation today patient appears to actually be doing very well in regard to her bilateral medial malleolus ulcers. She seems to have tolerated the chemical cauterization with silver nitrate last week she did have some pain through that evening but fortunately states that I'll be see since it seems to be doing better she is overall pleased with the progress. 01/21/18; really quite a remarkable improvement since I've last seen these wounds. We started using silver nitrate specially on the islands of hyper granulation which for some reason her around the wound circumference. This is really done quite nicely. Primary dressing Hydrofera Blue under 4 layer compression. She seems to be able to hold out without a nurse rewrap. Follow-up in 1 week 01/28/18; we've continued the hydrofera blue but continued with chemical cauterization to the wound area that we started about a month ago for irregular hyper granulation. She is made almost  stunning improvement in the overall wound dimensions. I was not really expecting this degree of improvement in these chronic wounds 02/05/18; we continue with Hydrofera Bluebut of also continued the aggressive chemical cauterization with silver nitrate. We made nice progress with the right greater than left  wound. 02/12/18. We continued with Hydrofera Blue after aggressive chemical cauterization with silver nitrate. We appear to be making nice progress with both wound areas 02/19/2018; we continue with [facility] after washing the wounds vigorously with Anasept spray and chemical cauterization with silver nitrate. We are making excellent progress. The area on the right's just about closed 02/26/2018. The area on the left medial ankle had too much necrotic debris today. I used a #5 curette we are able to get most of the soft. I continued with the silver nitrate to the much smaller wound on the right medial ankle she had a new area on her right lower pretibial area which she says was due to a role in her compression 03/05/2018; both wound areas look healthy. Not much change in dimensions from last week. I continue to use silver nitrate and Hydrofera Blue. The patient saw Dr. Doren Custard of vein and vascular. He felt she had venous stasis ulcers. He felt based on her previous arteriogram she should have adequate circulation for healing. Also she has deep venous reflux but really no significant correctable superficial venous reflux at this time. He felt we should continue with conservative management including leg elevation and compression 04/02/2018; since we last saw this woman about a month ago she had a fall apparently suffered a pelvic fracture. I did not look up the x-ray. Nevertheless because of pain she literally was bedbound for 2 weeks and had home health coming out to change the dressing. Somewhat predictably this is resulted in considerable improvement in both wound areas. The right is just about closed on the medial malleolus and the left is about half the size. 04/16/2018; both her wounds continue to go down in size. Using Hydrofera Blue. 05/07/18; both her wounds appeared to be improving especially on the right where it is almost closed. We are using Hydrofera Blue 05/14/2018; slightly worse this  week with larger wounds. Surface on the left medial not quite as good. We have been using Hydrofera Blue 05/21/18; again the wounds are slightly larger. Left medial malleolus slightly larger with eschar around the circumference. We have been using Hydrofera Blue undergoing a wraps for a prolonged period of time. This got a lot better when she was more recumbent due to a fall and a back injury. I change the primary dressing the silver alginate today. She did not tolerate a 4 layer compression previously although I may need to bring this up with her next time 05/28/2018; area on the left medial malleolus again is slightly larger with more drainage. Area on the right is roughly unchanged. She has a small area of folliculitis on the right medial just on the lower calf. This does not look ominous. 06/03/2018 left medial malleolus slightly smaller in a better looking surface. We used silver nitrate on this last time with silver alginate. The area on the right appears slightly smaller 1/10; left medial malleolus slightly smaller. Small open area on the right. We used silver nitrate and silver alginate as of 2 weeks ago. We continue with the wound and compression. These got a lot better when she was off her feet 1/17; right medial malleolus wound is smaller. The left may be slightly smaller. Both surfaces  look somewhat better. 1/24; both wounds are slightly smaller. Using silver alginate under Unna boots 1/31; both wounds appear smaller in fact the area on the right medial is just about closed. Surface eschar. We have been using silver alginate under Unna boots. The patient is less active now spends let much less time on her feet and I think this is contributed to the general improvement in the wound condition 2/7; both wounds appear smaller. I was hopeful the right medial would be closed however there there is still the same small open area. Slight amount of surface eschar on the left the dimensions are  smaller there is eschar but the wound edges appear to be free. We have been using silver alginate under Unna boot's 2/14; both wounds once again measure smaller. Circumferential eschar on the left medial. We have been using silver alginate under Unna boots with gradual improvement 2/21; the area on the right medial malleolus has healed. The area on the left is smaller. We have been using silver alginate and Unna boots. We can discharge wrapping the right leg she has 20/30 stockings at home she will need to protect the scar tissue in this area 2/28; the area on the right medial malleolus remains closed the patient has a compression stocking. The area on the left is smaller. We have been using silver alginate and Unna boots. 3/6 the area on the right medial ankle remains closed. Good edema control noted she is using her own compression stocking. The area on the left medial ankle is smaller. We have been managing this with silver alginate and Unna boots which we will continue today. 3/13; the area on the right medial ankle remains closed and I'm declaring it healed today. When necessary the left is about the same still a healthy-looking surface but no major change and wound area. No evidence of infection and using silver alginate under unna and generally making considerable improvement 3/27 the area on the right medial ankle remains closed the area on the left is about the same as last week. Certainly not any worse we have been using silver alginate under an Unna boot 4/3; the area on the right medial ankle remains closed per the patient. We did not look at this wound. The wound on the left medial ankle is about the same surface looks healthy we have been using silver alginate under an Unna boot 4/10; area on the right medial ankle remains closed per the patient. We did not look at this wound. The wound on the left medial ankle is slightly larger. The patient complains that the [facility] caused  burning pain all week. She also told us that she was a lot more active this week. Changed her back to silver alginate 4/17; right medial ankle still closed per the patient. Left medial ankle is slightly larger. Using silver alginate. She did not tolerate Hydrofera Blue on this area 4/24; right medial ankle remains closed we have not look at this. The left medial ankle continues to get larger today by about a centimeter. We have been using silver alginate under Unna boots. She complains about 4 layer compression as an alternative. She has been up on her feet working on her garden 5/8; right medial ankle remains closed we did not look at this. The left medial ankle has increased in size about 100%. We have been using silver alginate under Unna boots. She noted increased pain this week and was not surprised that the wound is deteriorated 5/15;  no major change in SA however much less erythema ( one week of doxy ocellulitis). 5/22-65 year old female returns at 1 week to the clinic for left medial ankle wound for which we have been using silver alginate under 3 layer compression She was placed on DOXY at last visit - the wound is wider at this visit. She is in 3 layer compression 5/29; change to [facility] last week. I had given her empiric doxycycline 2 weeks ago for a week. She is in 3 layer compression. She complains of a lot of pain and drainage on presentation today. 6/5; using Hydrofera Blue. I gave her doxycycline recently empirically for erythema and pain around the wound. Believe her cultures showed enterococcus which not would not have been well covered by doxycycline nevertheless the wound looks better and I don't feel specifically that the enterococcus needs to be covered. She has a new what looks like a wrap injury on her lateral left ankle. 6/12; she is using Hydrofera Blue. She has a new area on the left anterior lower tibial area. This was a wrap injury last week. 6/19; the patient is  using Hydrofera Blue. She arrived with marked inflammation and erythema around the wound and tenderness. 12/01/18 on evaluation today patient appears to be doing a little bit better based on what I'm hearing from the standpoint of lassos evaluation to this as far as the overall appearance of the wound is concerned. Then sometime substandard she typically sees Dr. Dellia Nims. Nonetheless overall very pleased with the progress that she's made up to this point. No fevers, chills, nausea, or vomiting noted at this time. 7/10; some improvement in the surface area. Aggressively debrided last week apparently. I went ahead with the debridement today although the patient does not tolerate this very well. We have been using Iodoflex. Still a fair amount of drainage 7/17; slightly smaller. Using Iodoflex. 7/24; no change from last week in terms of surface area. We have been using Iodoflex. Surface looks and continues to look somewhat better 7/31; surface area slightly smaller better looking surface. We have been using Iodoflex. This is under Unna boot compression 8/7-Patient presents at 1 week with Unna boot and Iodoflex, wound appears better 8/14-Patient presents at 1 week with Iodoflex, we use the Unna boot, wound appears to be stable better.Patient is getting Botox treatment for the inversion of the foot for tendon release, Next week 8/21; we are using Iodoflex. Unna boot. The wound is stable in terms of surface area. Under illumination there is some areas of the wound that appear to be either epithelialized or perhaps this is adherent slough at this point I was not really clear. It did not wipe off and I was reluctant to debride this today. 8/28; we are using Iodoflex in an Unna boot. Seems to be making good improvement. 9/4; using Iodoflex and wound is slightly smaller. 9/18; we are using Iodoflex with topical silver nitrate when she is here. The wound continues to be smaller 10/2; patient missed her  appointment last week due to GI issues. She left and Iodoflex based dressing on for 2 weeks. Wound is about the same size about the size of a dime on the left medial lower 10/9 we have been using Iodoflex on the medial left ankle wound. She has a new superficial probable wrap injury on the dorsal left ankle 10/16; we have been using Hydrofera Blue since last week. This is on the left medial ankle 10/23; we have been using Hydrofera Blue since  2 weeks ago. This is on the left medial ankle. Dimensions are better 11/6; using Hydrofera Blue. I think the wound is smaller but still not closed. Left medial ankle 11/13; we have been using Hydrofera Blue. Wound is certainly no smaller this week. Also the surface not as good. This is the remanent of a very large area on her left medial ankle. 11/20; using Sorbact since last week. Wound was about the same in terms of size although I was disappointed about the surface debris 12/11; 3-week follow-up. Patient was on vacation. Wound is measuring slightly larger we have been using Sorbact. 12/18; wound is about the same size however surface looks better last week after debridement. We have been using Sorbact under compression 1/15 wound is probably twice the size of last time increased in length nonviable surface. We have been using Sorbact. She was running a mild fever and missed her appointment last week 1/22; the wound is come down in size but under illumination still a very adherent debris we have been Hydrofera Blue that I changed her to last week 1/29; dimensions down slightly. We have been using Hydrofera Blue 2/19 dimensions are the same however there is rims of epithelialization under illumination. Therefore more the surface area may be epithelialized 2/26; the patient's wound actually measures smaller. The wound looks healthy. We have been using Hydrofera Blue. I had some thoughts about running Apligraf then I still may do that however this looks so much  better this week we will delay that for now 3/5; the wound is small but about the same as last week. We have been using Hydrofera Blue. No debridement is required today. 3/19; the wound is about the size of a dime. Healthy looking wound even under illumination. We have been using Hydrofera Blue. No mechanical debridement is necessary 3/26; not much change from last week although still looks very healthy. We have been using Hydrofera Blue under Unna boots Patient was offered an ankle fusion by podiatry but not until the wound heals with a proceed with this. 4/9; the patient comes in today with her original wound on the medial ankle looking satisfactory however she has some uncontrolled swelling in the middle part of her leg with 2 new open areas superiorly just lateral to the tibia. I think this was probably a wrap issue. She said she felt uncomfortable during the week but did not call in. We have been using Hydrofera Blue 4/16; the wound on the medial ankle is about the same. She has innumerable small areas superior to this across her mid tibia. I think this is probably folliculitis. She is also been working in the yard doing a lot of sweating 4/30; the patient issue on the upper areas across her mid tibia of all healed. I think this was excessive yard work if I remember. Her wound on the medial ankle is smaller. Some debris on this we have been using Hydrofera Blue under Unna boots 5/7; mid tibia. She has been using Hydrofera Blue under an Unna wrap. She is apparently going for her ankle surgery on June 3 10/28/19-Patient returns to clinic with the ankle wound, we are using Hydrofera Blue under Unna wrap, surgery is scheduled for her left foot for June 3 so she will be back for nurse visit next week READMISSION 01/17/2020 Mrs. [name] is a 65 year old woman we have had in this clinic for a long period of time with severe venous hypertension and refractory wounds on her medial lower legs and  ankles  bilaterally. 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 When she was off her legs for a prolonged period example when she fell and suffered a compression fracture things would heal up quite nicely. She is now retired and we managed to heal up the right medial leg wound. The left one was very tiny last time I saw this although still refractory. 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 It got to the point where this was interfering with her walking and she elected to proceed with a ankle arthrodesis to straighten her her ankle and leave her with a functional outcome for mobilization. The patient was referred to Dr. Doren Custard and really this took some time to arrange. Dr. Doren Custard saw her on 12/07/2019. Once again he verified that she had no arterial issues. She had previously had an angiogram several years ago. Follow-up ABIs on the left showed an ABI of 1.12 with triphasic waveforms and a TBI of 0.92. She is felt to have chronic deep venous insufficiency but I do not think it was felt that anything could be done from about this from an ablation point of view. At the time Dr. Doren Custard saw this patient the wounds actually look closed via the pictures in his clinic. The patient finally underwent her surgery on 12/15/2019. This went reasonably well and there was a good anatomic outcome. She developed a small distal wound dehiscence on the lateral part of the surgical wound. However more problematically she is developed recurrence of the wound on the medial left ankle. There are actually 2 wounds here one in the distal lower leg and 1 pretty much at the level of the medial malleolus. It is a more distal area that is more problematic. She has been using Hydrofera Blue which started on Friday before this she was  simply Ace wrapping. There was a culture done that showed Pseudomonas and she is on ciprofloxacin. A recent CNS on 8/11 was negative. The patient reports some pain but I generally think this is improving. She is using a cam boot completely nonweightbearing using a walker for pivot transfers and a wheelchair 8/24; not much improvement unfortunately she has a surgical wound on the lateral part in the venous insufficiency wound medially. The bottom part of the medial insufficiency wound is still necrotic there is exposed tendon here. We have been using Hydrofera Blue under compression. Her edema control is however better 8/31; patient in for follow-up of his surgical wound on the lateral part of her left leg and chronic venous insufficiency ulcers medially. We put her back in compression last week. She comes in today with a complaint of 3 or 4 days worth of increasing pain. She felt her cam walker was rubbing on the area on the back of her heel. However there is intense erythema seems more likely she has cellulitis. She had 2 cultures done when she was seeing podiatry in the postop. One of them in late July showed Pseudomonas and she received a course of ciprofloxacin the other was negative on 8/11 she is allergic to penicillin with anaphylactoid complaints of hives oral swelling via information in epic 9/9; when I saw this patient last week she had intense anterior erythema around her wound on the right lateral heel and ankle and also  into the right medial heel. Some of this was no doubt drainage and her walker boot however I was convinced she had cellulitis. I gave her Levaquin and Bactrim she is finishing up on this now. She is following up with Dr. [name] he saw her yesterday. He is taken her out of the walking boot of course she is still nonweightbearing. Her x-ray was negative for any worrisome features such as soft tissue air etc. Things are a lot better this week. She has home health. We have been  using Hydrofera Blue under an [employer] which she put back on yesterday. I did not wrap her last week 9/17; her surrounding skin looks a lot better. In fact the area on the left lateral ankle has just a scant amount of eschar. The only remaining wound is the large area on the left medial ankle. Probably about 60% of this is healthy granulation at the surface however she has a significant divot distally. This has adherent debris in it. I been using debridement and silver collagen to try and get this area to fill-in although I do not think we have made much progress this week 9/24; the patient's wound on the left medial ankle looks a lot better. The deeper divot area distally still requires debridement but this is cleaning up quite nicely we have been using silver collagen. The patient is complaining of swelling in her foot and is worried that that is contributing to the nonhealing of the ankle wound. She is also complaining of numbness in her anterior toes 10/4; left medial ankle. The small area distally still has a divot with necrotic material that I have been debriding away. This has an undermining area. She is approved for Apligraf. She saw Dr. [name] her surgeon on 10/1. I think he declared himself is satisfied with the condition of things. Still nonweightbearing till the end of the month. 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 Her surgical wound is well closed. There is no evidence of infection 10/11; the patient arrived in clinic today with the expectation that we be able to put an Apligraf on this area after debridement however she arrives with a relatively large amount of green drainage on the dressing. The patient states that this started on Friday. She has not been systemically unwell. 10/19; culture I did last week showed both Enterococcus and Pseudomonas. I think this came in separate parts because I stopped her ciprofloxacin I gave her and prescribed her  linezolid however now looking at the final culture result this is Pseudomonas which is resistant to quinolones. She has not yet picked up the linezolid apparently phone issues. We are also trying to get a topical antibiotic out of [place] in [place] they can be applied by home health. She is still having green drainage 10/16; the patient has her topical antibiotic from [facility] in [place]. This is a compounded gel with vancomycin and ciprofloxacin and gentamicin. We are applying this on the wound bed with silver alginate over the top with Unna boot wraps. She arrives in clinic today with a lot less ominous looking drainage although she is only use this topical preparation once the second time today. She sees Dr. [name] her surgeon on Friday she has home health changing the dressing 11/2; still using her compounded topical antibiotic under silver alginate. Surface is cleaning up there is less drainage. We had an Apligraf for her today and I elected to apply it. A light  coating of her antibiotic 04/25/2020 upon evaluation today patient appears to be doing well with regard to her ankle ulcer. There is a little bit of slough noted on the surface of the wound I am can have to perform sharp debridement to clear this away today. With that being said other than that fact overall I feel like she is making progress and we do see some new epithelial growth. There is also some improvement in the depth of the wound and that distal portion. There is little bit of slough there as well. 12/7; 2-week follow-up. Apligraf #3. Dimensions are smaller. Closing in especially inferiorly. Still some surface debris. Still using the [facility] topical antibiotic but I told her that I don't think this needs to be renewed 12/21; 2-week follow-up. Apligraf #4 dimensions are smaller. Nice improvement 06/05/2020; 2-week follow-up. The patient's wound on the left medial ankle looks really excellent. Nice granulation.  Advancing epithelialization no undermining no evidence of infection. We would have to reapply for another Apligraf but with the condition of this wound I did not feel strongly about it. We used Hydrofera Blue under the same degree of compression. She follows up with Dr. [name] her surgeon a week Friday 06/13/2020 upon evaluation today patient appears to be doing excellent in regard to her wound. She has been tolerating the dressing changes without complication. Fortunately there is no signs of active infection at this time. No fevers, chills, nausea, vomiting, or diarrhea. She was using Hydrofera Blue last week. 06/20/2020 06/20/2020 on evaluation today patient actually appears to be doing excellent in regard to her wound. This is measuring better and looking much better as well. She has been using the collagen that seems to be doing better for her as well even though the [facility] was and is not sticking or feeling as rough on her wound. She did see Dr. [name] on Friday he is very pleased he also stated none of the hardware has shifted. That is great news 1/27; the patient has a small clean wound all that is remaining. I agree that this is too small to really consider an Apligraf. Under illumination the surface is looking quite good. We have been using collagen although the dimensions are not any better this week 2/2; the patient has a small clean wound on the left medial ankle. Although this left of her substantial original areas. Measurements are smaller. We have been using polymen Ag under an Haematologist. 2/10; small area on the left medial ankle. This looks clean nothing to debride however dimensions are about the same we have been using polymen I think now for 2 weeks 2/17; not much change in surface area. We have been using polymen Ag without any improvement. 3/17; 1 month follow-up. The patient has been using endoform without any improvement in fact I think this looks worse with more depth and  more expansion 3/24; no improvement. Perhaps less debris on the surface. We have been using Sorbact for 1 week 4/4; wound measures larger. She has edema in her leg and her foot which she tells as her wrap came down. We have been using Unna boots. Sorbact of the wound. She has been approved for Apligraf 09/12/2020 upon evaluation today patient appears to be doing well with regard to her wound. We did get the Apligraf reapproved which is great news we have that available for application today. Fortunately there is no signs of infection and overall the patient seems to be doing great. The wound bed  is nice and clean. 4/27; patient presents for her second application of Apligraf. She states over the past week she has been on her feet more often due to being outside in her garden. She has noted more swelling to her foot as a result. She denies increased warmth, pain or erythema to the wound site. 10/10/2020 upon evaluation today patient appears to be doing well with regard to her wound which does not appear to be quite as irritated as last week from what I am hearing. With that being said unfortunately she is having issues with some erythema and warmth to touch as well as an increase size. I do believe this likely is infected. 10/17/2020 upon evaluation today patient appears to be doing excellent in regard to her wound this is significantly improved as compared to last week. Fortunately I think that the infection is much better controlled at this point. She did have evidence of both Enterococcus as well as Staphylococcus noted on culture. Enterococcus really would not be helped significantly by the Cipro but the wound is doing so much better I am under the assumption that the Staphylococcus is probably the main organism that is causing the current infection. Nonetheless I think that she is doing excellent as far as that is concerned and I am very pleased in that regard. I would therefore recommend she  continue with the Cipro. 10/31/2020 upon evaluation today patient appears to be doing well with regard to her wound. She has been tolerating the dressing changes without complication. Fortunately there is no signs of active infection and overall I am extremely pleased with where things stand today. No fevers, chills, nausea, vomiting, or diarrhea. With that being said she does have some green drainage coming from the wound and although it looks okay I am a little concerned about the possibility of a continuing infection. Specifically with Pseudomonas. For that reason I will go ahead and send in a prescription for Cipro for her to be continued. 11/14/2020 upon evaluation today patient appears to be doing very well currently in regard to her wound on her leg. She has been tolerating the dressing changes without complication. Fortunately I feel like the infection is finally under good control here. Unfortunately we do not have the Apligraf for application today although we can definitely order to have it in place for next week. That will be her fifth and final of the current series. Nonetheless I feel like her wound is really doing quite well which is great news. 11/21/2020 upon evaluation today patient appears to be doing well with regard to her wound on the medial ankle. Fortunately I think the infection is under control and I do believe we can go ahead and reapply the Apligraf today. She is in agreement with that plan. There does not appear to be any signs of active infection at this time which is great news. No fevers, chills, nausea, vomiting, or diarrhea. 12/05/2020 upon evaluation today patient's wound bed actually showed signs of good granulation epithelization at this point. There does not appear to be any signs of infection which is great news and overall very pleased with where things stand. Overall the patient seems to be doing fairly well in my opinion with regard to her wound although I do believe  she continues to build up a lot of biofilm I think she could benefit from using PuraPly at this point. 12/12/2020 upon evaluation today patient's wound actually appears to be doing decently well today. The [employer]  has not been quite as well-tolerated so that more uncomfortable for her and even causing some pressure over the plate on the lateral portion of her foot which is 90 where the wound is. There did not appear to be any significant deep tissue injury with that there may be a minimal change in the skin noted I think that we may want to go back to the Coflex 2 layer which is a little bit easier on her skin it seems. 12/19/2020 upon evaluation today patient actually seems to be making great progress with the PuraPly currently. She in fact seems to be much better as far as the overall appearance of the wound bed is concerned I am very happy in this regard. I do not see any signs of of infection which is great news as well. No fevers, chills, nausea, vomiting, or diarrhea. 12/26/2020 upon evaluation today patient appears to actually be doing better in regard to her wound on the left medial ankle region. The surface of the wound is actually doing significantly better which is great news. There does not appear to be any signs of infection which is also great news and in general I am extremely pleased with where we stand today. 01/02/2021 upon evaluation today patient appears to be doing well with regard to her wound. In fact this is showing signs of excellent improvement and very pleased with where things stand. In fact the last 3 appointments have all shown signs of this getting smaller which is excellent news. I have not even had to perform any debridement and today is no exception. Overall I feel like this is dramatically improved compared to previous. T oday is PuraPly application #4. [id number] upon evaluation today patient appears to be doing excellent in regard to her wound this is continue to show  signs of improvement and overall I am extremely pleased with where we stand today. She is actually here for PuraPly application #5. Every time we have applied this she is noted definite improvement on measurements. 01/23/2021 upon evaluation today patient is actually making good progress in regard to her wound. This was actually on just a little bit longer this time compared to previous due to the fact that she did have to go out of town. She is actually here for PuraPly application #6. We have definitely been seeing improvements in the overall quality of the tissue on the surface of the wound which is awesome news. In general I think that the patient seems to be continuing to make great progress here. 01/30/2021 upon evaluation today patient's wound is actually doing excellent. There is really not any significant biofilm buildup which is great news and overall I am extremely pleased with where things stand today. There does not appear to be any signs of active infection. No fevers, chills, nausea, vomiting, or diarrhea. 02/06/2021 upon evaluation today patient's wound is actually showing signs of excellent improvement which is great news. We continue to see the benefit of the PuraPly this is doing a great job the wound seems not really irritated whatsoever and is showing signs of good granulation at this point. Overall I am extremely happy with what we are seeing. The patient likewise is happy to hear all of this as well. 02/13/2021 upon evaluation today patient appears to be doing well with regard to her wound. This again is measuring smaller today and I am very pleased in this regard. Fortunately there does not appear to be any signs of active infection at this  time which is good news from a systemic standpoint. Locally there is still some significant drainage which she does have is concerned about infection locally. No fevers, chills, nausea, vomiting, or diarrhea. 02/20/2021 upon evaluation today  patient actually appears to be doing decently well in regard to her wound. She has been tolerating the dressing changes without complication. I do believe the PuraPly is helping wound bed does appear to be doing better. There is no evidence of active infection locally or systemically at this point visually although on fluorescence imaging there still appear to be bacterial load and bioburden noted. Objective Constitutional Well-nourished and well-hydrated in no acute distress. Vitals Time Taken: 10:28 AM, Height: 68 in, Temperature: 98.0 F, Pulse: 50 bpm, Respiratory Rate: 20 breaths/min, Blood Pressure: 120/53 mmHg. Respiratory normal breathing without difficulty. Psychiatric this patient is able to make decisions and demonstrates good insight into disease process. Alert and Oriented x 3. pleasant and cooperative. General Notes: Upon inspection patient's wound bed actually showed signs of good granulation epithelization at this point. I am actually pleased with where things stand I think we are headed in appropriate direction here. I do not see any signs of active infection systemically and even locally though the fluorescence imaging did show signs of red and blush fluorescence I do not see this with the naked eye. I do believe that nonetheless there may be a bioburden level that is at least at risk for causing this complication here. I would have her continue the Bactrim we did actually do a PCR screening today to see what was shown in that regard. We will see the results of that and make any adjustments necessary as far as her treatment is concerned. Integumentary (Hair, Skin) Wound #15 status is Open. Original cause of wound was Gradually Appeared. The date acquired was: 12/30/2019. The wound has been in treatment 57 weeks. The wound is located on the Left,Medial Malleolus. The wound measures 2.7cm length x 1.6cm width x 0.1cm depth; 3.393cm^2 area and 0.339cm^3 volume. There is Fat Layer  (Subcutaneous Tissue) exposed. There is no tunneling or undermining noted. There is a medium amount of serosanguineous drainage noted. The wound margin is distinct with the outline attached to the wound base. There is large (67-100%) red granulation within the wound bed. There is a small (1-33%) amount of necrotic tissue within the wound bed. 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 A skin graft procedure using a bioengineered skin substitute/cellular or tissue based product was performed by [name], PA with the following instrument(s): N/A. Puraply AM was applied and secured with adaptic. 12 sq cm of product was utilized and 0 sq cm was wasted. Post Application, gauze was applied. A Time Out was conducted at 11:30, prior to the start of the procedure. The procedure was tolerated well with a pain level of 0 throughout and a pain level of 0 following the procedure. Post procedure Diagnosis Wound #15: Same as Pre-Procedure . Pre-procedure diagnosis of Wound #15 is a Venous Leg Ulcer located on the Left,Medial Malleolus . There was a Double Layer Compression Therapy Procedure by Deon Pilling, RN. Post procedure Diagnosis Wound #15: Same as Pre-Procedure Plan Follow-up Appointments: Return Appointment in  1 week. - with Margarita Grizzle Cellular or Tissue Based Products: Cellular or Tissue Based Product Type: - puraply AM#10 Cellular or Tissue Based Product applied to wound bed, secured with steri-strips, cover with Adaptic or Mepitel. (DO NOT REMOVE). - no steristrips Bathing/ Shower/ Hygiene: May shower with protection but do not get  wound dressing(s) wet. Edema Control - Lymphedema / SCD / Other: Elevate legs to the level of the heart or above for 30 minutes daily and/or when sitting, a frequency of: - throughout the day Avoid standing for long periods of time. Exercise regularly Compression stocking or Garment 20-30 mm/Hg pressure to: - right leg daily Additional Orders / Instructions: Follow Nutritious Diet WOUND #15: - Malleolus Wound Laterality: Left, Medial Cleanser: Soap and Water 1 x Per Week/30 Days Discharge Instructions: May shower and wash wound with dial antibacterial soap and water prior to dressing change. Peri-Wound Care: Triamcinolone 15 ([name]) 1 x Per Week/30 Days Discharge Instructions: Use triamcinolone 15 ([name]) as directed Peri-Wound Care: Sween Lotion (Moisturizing lotion) 1 x Per Week/30 Days Discharge Instructions: Apply moisturizing lotion as directed Prim Dressing: PuraPly AM 1 x Per Week/30 Days ary Secondary Dressing: Woven Gauze Sponge, Non-Sterile 4x4 in 1 x Per Week/30 Days Discharge Instructions: to bolster skin sub Secondary Dressing: ABD Pad, 5x9 1 x Per Week/30 Days Discharge Instructions: Apply over primary dressing and lateral ankle Secondary Dressing: Optifoam Non-Adhesive Dressing, 4x4 in 1 x Per Week/30 Days Discharge Instructions: Apply over primary dressing pad lateral foot with foam donut Secondary Dressing: ADAPTIC TOUCH 3x4.25 in 1 x Per Week/30 Days Discharge Instructions: Apply over skin sub Com pression Wrap: CoFlex TLC XL 2-layer Compression System 4x7 (in/yd) 1 x Per Week/30 Days Discharge Instructions: Apply CoFlex 2-layer compression as directed. (alt for 4 layer) 1. Would recommend currently that we going to continue with the PuraPly and again that was reapplied during the office visit today. 2. I am also going to recommend that we have the patient continue to elevate her leg is much as possible were also using a compression wrap which is doing great and keeping the  edema under good control. 3. I would also suggest that she continue with the Bactrim and we did perform a PCR culture today we will see what that shows make any adjustments in therapy as necessary. MolecuLight DX: 1st Scanned Wound The following wound was scanned with MolecuLight DX): Right lower extremity ulcer Fluorescence bacterial imaging was medically necessary today due to Prior visit fluorescence image demonstrated a high bioburden load monitoring (Indication): impact of prescribe tx MolecuLight Results Red Colors, Blush Colors, Green Colors The indicated colors were noted in the following area(s). In the periphery of the wound As a result of todays scan, the following treatment plans were put in place. PCR culture obtained continue Bactrim MolecuLight Procedure The MolecularLight DX device was cleaned with a disinfectant wipe prior to use., The correct patient profile was confirmed and correct wound was verified., Range finder sensor used to ensure appropriate distance selected The following was completed: between imaging unit and wound bed, Room lights were turned off and the ambient light sensor was checked., Blue circle appeared around the lightbulb., The fluorescence icon was selected. Screen was tapped to enhance focus and the image was captured. Additional drapes were used to ensure adequate darknesso No Potential ICD-10 Codes ICD-10 A49.9 Bacterial infection unspecified (Red/Blush/Yellow Color) Additional Scanned Wounds Did you scan any additional Woundso No Electronic Signature(s) Signed: 02/20/2021 11:37:17 AM By: [name] PA-C Entered  By: [name] on 02/20/2021 11:37:17 -------------------------------------------------------------------------------- SuperBill Details Patient Name: Date of Service: [facility], [name] 02/20/2021 Medical Record Number: [id number] Patient Account Number: [id number] Date of Birth/Sex: Treating RN: 10/17/1955 (65 y.o. [name] Primary Care Provider: [name] Other Clinician: Referring Provider: Treating Provider/Extender: [name] in Treatment: 32 Diagnosis Coding ICD-10 Codes Code Description [phone] Chronic venous hypertension (idiopathic) with ulcer and inflammation of left lower extremity L97.828 Non-pressure chronic ulcer of other part of left lower leg with other specified severity L97.328 Non-pressure chronic ulcer of left ankle with other specified severity Facility Procedures CPT4 Code: [id number] Description: [id number] PuraPly AM 3X4 (12sq. cm) enter 12qty Modifier: Quantity: 12 CPT4 Code: [id number] Description: [id number] - SKIN SUB GRAFT TRNK/ARM/LEG ICD-10 Diagnosis Description L97.328 Non-pressure chronic ulcer of left ankle with other specified severity Modifier: Quantity: 1 CPT4 Code: [id number] Description: [id number] NONCNTACT RT FLORO WND 1ST STE ICD-10 Diagnosis Description L97.328 Non-pressure chronic ulcer of left ankle with other specified severity Modifier: 59 Quantity: 1 Physician Procedures : CPT4 Code Description Modifier [id number] - WC PHYS LEVEL 4 - EST PT 25 ICD-10 Diagnosis Description I87.332 Chronic venous hypertension (idiopathic) with ulcer and inflammation of left lower extremity L97.828 Non-pressure chronic ulcer of other  part of left lower leg with other specified severity L97.328 Non-pressure chronic ulcer of left ankle with other specified severity [id number] - WC PHYS SKIN SUB GRAFT TRNK/ARM/LEG 1 ICD-10 Diagnosis Description L97.328 Non-pressure chronic ulcer of  left ankle with other specified severity Quantity: 1 : [id number] NONCNTACT RT FLORO WND 1ST STE 59 1 ICD-10 Diagnosis Description L97.328 Non-pressure chronic ulcer of left ankle with other specified severity Quantity: Electronic Signature(s) Signed: 02/20/2021 5:43:52 PM By: Baruch Gouty RN, BSN Signed: 02/21/2021 2:21:00 PM By: [name] PA-C Previous  Signature: 02/20/2021 11:38:28 AM Version By: [name] PA-C Entered By: Baruch Gouty on 02/20/2021 15:43:12

## 2021-02-27 ENCOUNTER — Other Ambulatory Visit: Payer: Self-pay

## 2021-02-27 ENCOUNTER — Encounter (HOSPITAL_BASED_OUTPATIENT_CLINIC_OR_DEPARTMENT_OTHER): Payer: Medicare Other | Admitting: Physician Assistant

## 2021-02-27 DIAGNOSIS — I87332 Chronic venous hypertension (idiopathic) with ulcer and inflammation of left lower extremity: Secondary | ICD-10-CM | POA: Diagnosis not present

## 2021-02-27 NOTE — Progress Notes (Signed)
[name], [name] ([id number]) Visit Report for 02/27/2021 Arrival Information [name] Patient Name: Date of Service: [facility], [name] 02/27/2021 10:00 A M Medical Record Number: [id number] Patient Account Number: [id number] Date of Birth/Sex: Treating RN: [name] (65 y.o. [name] Primary Care Jody Silas: [name] Other Clinician: Referring Irmgard Rampersaud: Treating Hensley Treat/Extender: [name] in Treatment: 39 Visit Information History Since Last Visit Added or deleted any medications: No Patient Arrived: Cane Any new allergies or adverse reactions: No Arrival Time: 10:38 Had a fall or experienced change in No Accompanied By: self activities of daily living that [name] affect Transfer Assistance: None risk of falls: Patient Identification Verified: Yes Signs or symptoms of abuse/neglect since last visito No Secondary Verification Process Completed: Yes Hospitalized since last visit: No Patient Requires Transmission-Based Precautions: No Implantable device outside of the clinic excluding No Patient Has Alerts: Yes cellular tissue based products placed in the center Patient Alerts: L ABI =1.12, TBI = .92 since last visit: R ABI= 1.02, TBI= .58 Has Dressing in Place as Prescribed: Yes Has Compression in Place as Prescribed: Yes Pain Present Now: Yes Electronic Signature(s) Signed: 02/27/2021 5:46:28 PM By: Baruch Gouty RN, BSN Entered By: Baruch Gouty on 02/27/2021 10:40:43 -------------------------------------------------------------------------------- Compression Therapy [name] Patient Name: Date of Service: [name] Brandy [name], [name] 02/27/2021 10:00 A M Medical Record Number: [id number] Patient Account Number: [id number] Date of Birth/Sex: Treating RN: [name][birth year] (65 y.o. [name] Primary Care Amany Rando: [name] Other Clinician: Referring Babara Buffalo: Treating Malike Foglio/Extender: [name] in  Treatment: 74 Compression Therapy Performed for Wound Assessment: Wound #15 Left,Medial Malleolus Performed By: Clinician Baruch Gouty, RN Compression Type: Double Layer Post Procedure Diagnosis Same as Pre-procedure Electronic Signature(s) Signed: 02/27/2021 5:46:28 PM By: Baruch Gouty RN, BSN Entered By: Baruch Gouty on 02/27/2021 10:53:43 -------------------------------------------------------------------------------- Encounter Discharge Information [name] Patient Name: Date of Service: [name] [name], [name] 02/27/2021 10:00 A M Medical Record Number: [id number] Patient Account Number: [id number] Date of Birth/Sex: Treating RN: [name] (65 y.o. [name] Primary Care Oza Oberle: [name] Other Clinician: Referring Alya Smaltz: Treating Dianna Deshler/Extender: [name] in Treatment: 58 Encounter Discharge Information Items Post Procedure Vitals Discharge Condition: Stable Temperature (F): 98.1 Ambulatory Status: Cane Pulse (bpm): 55 Discharge Destination: Home Respiratory Rate (breaths/min): 18 Transportation: Private Auto Blood Pressure (mmHg): 126/57 Accompanied By: self Schedule Follow-up Appointment: Yes Clinical Summary of Care: Patient Declined Electronic Signature(s) Signed: 02/27/2021 5:46:28 PM By: Baruch Gouty RN, BSN Entered By: Baruch Gouty on 02/27/2021 11:44:02 -------------------------------------------------------------------------------- Lower Extremity Assessment [name] Patient Name: Date of Service: [name] [name], [name] 02/27/2021 10:00 A M Medical Record Number: [id number] Patient Account Number: [id number] Date of Birth/Sex: Treating RN: [name] (66 y.o. [name] Primary Care Zniyah Midkiff: [name] Other Clinician: Referring Zacharia Sowles: Treating Anwar Sakata/Extender: [name] in Treatment: 69 Edema Assessment Assessed: [Left: No] [Right: No] Edema: [Left: Ye]  [Right: s] Calf Left: Right: Point of Measurement: 31 cm From Medial Instep 27 cm Ankle Left: Right: Point of Measurement: 11 cm From Medial Instep 21.5 cm Vascular Assessment Pulses: Dorsalis Pedis Palpable: [Left:Yes] Electronic Signature(s) Signed: 02/27/2021 5:46:28 PM By: Baruch Gouty RN, BSN Entered By: Baruch Gouty on 02/27/2021 10:48:46 -------------------------------------------------------------------------------- [name] Patient Name: Date of Service: [name] Brandy [name], [name] 02/27/2021 10:00 A M Medical Record Number: [id number] Patient Account Number: [id number] Date of Birth/Sex: Treating RN: [name] (65 y.o. F)  Baruch Gouty Primary Care Anirudh Baiz: [name] Other Clinician: Referring Alvon Nygaard: Treating Emmanuela Ghazi/Extender: [name] in Treatment: 54 Multidisciplinary Care Plan reviewed with physician Active Inactive Venous Leg Ulcer Nursing Diagnoses: Actual venous Insuffiency (use after diagnosis is confirmed) Knowledge deficit related to disease process and management Goals: Patient will maintain optimal edema control Date Initiated: 06/20/2020 Target Resolution Date: 03/27/2021 Goal Status: Active Interventions: Assess peripheral edema status every visit. Compression as ordered Treatment Activities: Therapeutic compression applied : 06/20/2020 Notes: 01/30/21: Edema control ongoing, using CoFlex wrap. Wound/Skin Impairment Nursing Diagnoses: Impaired tissue integrity Knowledge deficit related to ulceration/compromised skin integrity Goals: Patient/caregiver will verbalize understanding of skin care regimen Date Initiated: 01/17/2020 Target Resolution Date: 03/27/2021 Goal Status: Active Ulcer/skin breakdown will have a volume reduction of 30% by week 4 Date Initiated: 01/17/2020 Date Inactivated: 03/12/2020 Target Resolution Date: 03/09/2020 Goal Status: Met Interventions: Assess  patient/caregiver ability to obtain necessary supplies Assess patient/caregiver ability to perform ulcer/skin care regimen upon admission and as needed Assess ulceration(s) every visit Provide education on ulcer and skin care Notes: 01/30/21: Wound care regimen continues, PuraPly skin sub being used. Electronic Signature(s) Signed: 02/27/2021 5:46:28 PM By: Baruch Gouty RN, BSN Entered By: Baruch Gouty on 02/27/2021 10:52:19 -------------------------------------------------------------------------------- Pain Assessment [name] Patient Name: Date of Service: [name] [name], [name] 02/27/2021 10:00 A M Medical Record Number: [id number] Patient Account Number: [id number] Date of Birth/Sex: Treating RN: 11-[name]-[name] (65 y.o. [name] Primary Care Olie Scaffidi: [name] Other Clinician: Referring Micaiah Litle: Treating Lanard Arguijo/Extender: [name] in Treatment: 13 Active Problems Location of Pain Severity and Description of Pain Patient Has Paino Yes Site Locations Pain Location: Pain in Ulcers With Dressing Change: No Duration of the Pain. Constant / Intermittento Intermittent Rate the pain. Current Pain Level: 3 Worst Pain Level: 5 Least Pain Level: 0 Character of Pain Describe the Pain: Aching Pain Management and Medication Current Pain Management: Medication: Yes Is the Current Pain Management Adequate: Adequate Rest: Yes How does your wound impact your activities of daily livingo Sleep: No Bathing: No Appetite: No Relationship With Others: No Bladder Continence: No Emotions: No Bowel Continence: No Work: No Toileting: No Drive: No Dressing: No Hobbies: No Electronic Signature(s) Signed: 02/27/2021 5:46:28 PM By: Baruch Gouty RN, BSN Entered By: Baruch Gouty on 02/27/2021 10:48:24 -------------------------------------------------------------------------------- Patient/Caregiver Education [name] Patient Name: Date of  Service: [name] Brandy [name], [name] 9/28/2022andnbsp10:00 A M Medical Record Number: [id number] Patient Account Number: [id number] Date of Birth/Gender: Treating RN: [birth year] (65 y.o. [name] Primary Care Physician: [name] Other Clinician: Referring Physician: Treating Physician/Extender: [name] in Treatment: 54 Education Assessment Education Provided To: Patient Education Topics Provided Venous: Methods: Explain/Verbal Responses: Reinforcements needed, State content correctly Electronic Signature(s) Signed: 02/27/2021 5:46:28 PM By: Baruch Gouty RN, BSN Entered By: Baruch Gouty on 02/27/2021 10:53:01 -------------------------------------------------------------------------------- Wound Assessment [name] Patient Name: Date of Service: [name] [name], [name] 02/27/2021 10:00 A M Medical Record Number: [id number] Patient Account Number: [id number] Date of Birth/Sex: Treating RN: 10[birth year]-[name] (65 y.o. [name] Primary Care Lakechia Nay: [name] Other Clinician: Referring Latania Bascomb: Treating Caasi Giglia/Extender: [name] in Treatment: 52 Wound Status Wound Number: 15 Primary Venous Leg Ulcer Etiology: Wound Location: Left, Medial Malleolus Wound Status: Open Wounding Event: Gradually Appeared Comorbid Peripheral Venous Disease, Osteoarthritis, Confinement Date Acquired: 12/30/2019 History: Anxiety Weeks Of Treatment: 58 Clustered Wound: No Photos Wound Measurements Length: (cm) 2.1 Width: (cm)  1 Depth: (cm) 0.1 Area: (cm) 1.649 Volume: (cm) 0.165 % Reduction in Area: 85.9% % Reduction in Volume: 98.2% Epithelialization: Small (1-33%) Tunneling: No Undermining: No Wound Description Classification: Full Thickness With Exposed Support Structures Wound Margin: Flat and Intact Exudate Amount: Medium Exudate Type: Purulent Exudate Color: yellow, brown, green Foul Odor After  Cleansing: No Slough/Fibrino No Wound Bed Granulation Amount: Large (67-100%) Exposed Structure Granulation Quality: Pink Fascia Exposed: No Necrotic Amount: None Present (0%) Fat Layer (Subcutaneous Tissue) Exposed: Yes Tendon Exposed: No Muscle Exposed: No Joint Exposed: No Bone Exposed: No Treatment Notes Wound #15 (Malleolus) Wound Laterality: Left, Medial Cleanser Soap and Water Discharge Instruction: [name] shower and wash wound with dial antibacterial soap and water prior to dressing change. Peri-Wound Care Triamcinolone 15 (g) Discharge Instruction: Use triamcinolone 15 (g) as directed Sween Lotion (Moisturizing lotion) Discharge Instruction: Apply moisturizing lotion as directed Topical Primary Dressing PuraPly AM Secondary Dressing Woven Gauze Sponge, Non-Sterile 4x4 in Discharge Instruction: to bolster skin sub ABD Pad, 5x9 Discharge Instruction: Apply over primary dressing and lateral ankle Optifoam Non-Adhesive Dressing, 4x4 in Discharge Instruction: Apply over primary dressing pad lateral foot with foam donut ADAPTIC TOUCH 3x4.25 in Discharge Instruction: Apply over skin sub Secured With Compression Wrap CoFlex TLC XL 2-layer Compression System 4x7 (in/yd) Discharge Instruction: Apply CoFlex 2-layer compression as directed. (alt for 4 layer) Compression Stockings Add-Ons Electronic Signature(s) Signed: 02/27/2021 5:46:28 PM By: Baruch Gouty RN, BSN Entered By: Baruch Gouty on 02/27/2021 10:51:51 -------------------------------------------------------------------------------- Vitals [name] Patient Name: Date of Service: [name] [name], [name] 02/27/2021 10:00 A M Medical Record Number: [id number] Patient Account Number: [id number] Date of Birth/Sex: Treating RN: [birth year] [name] (65 y.o. [name] Primary Care Celisse Ciulla: [name] Other Clinician: Referring Sakura Denis: Treating Eartha Vonbehren/Extender: [name] in  Treatment: 49 Vital Signs Time Taken: 10:40 Temperature (F): 98.1 Height (in): 68 Pulse (bpm): 55 Source: Stated Respiratory Rate (breaths/min): 18 Source: Stated Blood Pressure (mmHg): 126/57 Reference Range: 80 - 120 mg / dl Electronic Signature(s) Signed: 02/27/2021 5:46:28 PM By: Baruch Gouty RN, BSN Entered By: Baruch Gouty on 02/27/2021 10:47:44

## 2021-02-28 NOTE — Progress Notes (Addendum)
CALLEIGH, [name] ([id number]) Visit Report for 02/27/2021 Chief Complaint Document Details Patient Name: Date of Service: [facility] [name], [name] 02/27/2021 10:00 A M Medical Record Number: [id number] Patient Account Number: [id number] Date of Birth/Sex: Treating RN: [birth year] (65 y.o. [name] Primary Care Provider: Lennie [name] Other Clinician: Referring Provider: Treating Provider/Extender: [name] in Treatment: 82 Information Obtained from: Patient Chief Complaint patient is been followed long-term in this clinic for venous insufficiency ulcers with inflammation, hypertension and ulceration over the medial ankle bilaterally. 01/17/2020; this is a patient who is here for review of postoperative wounds on the left lateral ankle and recurrence of venous stasis ulceration on the left medial Electronic Signature(s) Signed: 02/27/2021 11:05:08 AM By: [name] PA-C Entered By: Worthy [name] on 02/27/2021 11:05:08 -------------------------------------------------------------------------------- Cellular or Tissue Based Product Details Patient Name: Date of Service: [facility] [name], [name] 02/27/2021 10:00 A M Medical Record Number: [id number] Patient Account Number: [id number] Date of Birth/Sex: Treating RN: 06-19-1955 (65 y.o. [name] Primary Care Provider: Lennie [name] Other Clinician: Referring Provider: Treating Provider/Extender: [name] in Treatment: 63 Cellular or Tissue Based Product Type Wound #15 Left,Medial Malleolus Applied to: Performed By: Physician [name], PA Cellular or Tissue Based Product Type: Puraply AM Level of Consciousness (Pre-procedure): Awake and Alert Pre-procedure Verification/Time Out Yes - 11:27 Taken: Location: trunk / arms / legs Wound Size (sq cm): 2.1 Product Size (sq cm): 12 Waste Size (sq cm): 6 Waste Reason: wound size Amount of Product Applied (sq cm):  6 Instrument Used: Forceps, Scissors Lot #: [id number] Order #: [id number] Expiration Date: 04/23/2023 Fenestrated: No Reconstituted: Yes Solution Type: saline Solution Amount: 2 ml Lot #: [id number] Solution Expiration Date: 01/31/2022 Secured: No Dressing Applied: Yes Primary Dressing: adaptic Procedural Pain: 0 Post Procedural Pain: 0 Response to Treatment: Procedure was tolerated well Level of Consciousness (Post- Awake and Alert procedure): Post Procedure Diagnosis Same as Pre-procedure Electronic Signature(s) Signed: 02/27/2021 5:46:28 PM By: [name] Gouty RN, BSN Signed: 03/01/2021 4:42:31 PM By: [name] PA-C Entered By: [name] on 02/27/2021 11:31:23 -------------------------------------------------------------------------------- HPI Details Patient Name: Date of Service: [name], [name] 02/27/2021 10:00 A M Medical Record Number: [id number] Patient Account Number: [id number] Date of Birth/Sex: Treating RN: 1955-12-01 (65 y.o. [name] Primary Care Provider: Lennie [name] Other Clinician: Referring Provider: Treating Provider/Extender: [name] in Treatment: 39 History of Present Illness HPI Description: the remaining wound is over the left medial ankle. Similar wound over the right medial ankle healed largely with use of Apligraf. Most recently we have been using Hydrofera Blue over this wound with considerable improvement. The patient has been extensively worked up in the past for her venous insufficiency and she is not a candidate for antireflux surgery although I have none of the details available currently. 08/24/14; considerable improvement today. About 50% of this wound areas now epithelialized. The base of the wound appears to be healthier granulation.as opposed to last week when she had deteriorated a considerable improvement 08/17/14; unfortunately the wound has regressed somewhat. The areas of epithelialization  from the superior aspect are not nearly as healthy as they were last week. The patient thinks her Hydrofera Blue slipped. 09/07/14; unfortunately the area has markedly regressed in the 2 weeks since I've seen this. There is an [name] surrounding erythema. The healthy granulation tissue that we had at the base of the  wound now is a dusky color. The nurse reports green drainage 09/14/14; the area looks somewhat better than last week. There is less erythema and less drainage. The culture I did did not show any growth. Nevertheless I think it is better to continue the Cipro and doxycycline for a further week. The remaining wound area was debridement. 09/21/14. Wound did not require debridement last week. Still less erythema and less drainage. She can complete her antibiotics. The areas of epithelialization in the superior aspect of the wound do not look as healthy as they did some weeks ago 10/05/14 continued improvement in the condition of this wound. There is advancing epithelialization. Less aggressive debridement required 10/19/14 continued improvement in the condition and volume of this wound. Less aggressive debridement to the inferior part of this to remove surface slough and fibrinous eschar 11/02/14 no debridement is required. The surface granulation appears healthy although some of her islands of epithelialization seem to have regressed. No evidence of infection 11/16/14; lites surface debridement done of surface eschar. The wound does not look to be unhealthy. No evidence of infection. Unfortunately the patient has had podiatry issues in the right foot and for some reason has redeveloped small surface ulcerations in the medial right ankle. Her original presentation involved wounds in this area 11/23/14 no debridement. The area on the right ankle has enlarged. The left ankle wound appears stable in terms of the surface although there is periwound inflammation. There has been regression in the amount of new  skin 11/30/14 no debridement. Both wound areas appear healthy. There was no evidence of infection. The the new area on the right medial ankle has enlarged although that both the surfaces appear to be stable. 12/07/14; Debridement of the right medial ankle wound. No no debridement was done on the left. 12/14/14 no major change in and now bilateral medial ankle wounds. Both of these are very painful but the no overt evidence of infection. She has had previous venous ablation 12/21/14; patient states that her right medial ankle wound is considerably more painful last week than usual. Her left is also somewhat painful. She could not tolerate debridement. The right medial ankle wound has fibrinous surface eschar 12/28/14 this is a patient with severe bilateral venous insufficiency ulcers. For a considerable period of time we actually had the one on the right medial ankle healed however this recently opened up again in June. The left medial ankle wound has been a refractory area with some absent flows. We had some success with Hydrofera Blue on this area and it literally closed by 50% however it is recently opened up Foley. Both of these were debridement today of surface eschar. She tolerates this poorly 01/25/15: No change in the status of this. Thick adherent escar. Very poor tolerance of any attempt at debridement. I had healed the right medial malleolus wound for a considerable amount of time and had the left one down to about 50% of the volume although this is totally regressed over the last 48 weeks. Further the right leg has reopened. she is trying to make a appointment with pain and vascular, previous ablations with Dr. Aleda Grana. I do not believe there is an arterial insufficiency issue here 02/01/15 the status of the adherent eschar bilaterally is actually improved. No debridement was done. She did not manage to get vascular studies done 02/08/15 continued debridement of the area was done today. The  slough is less adherent and comes off with less pressure. There is no surrounding infection  peripheral pulses are intact 02/15/15 selective debridement with a disposable curette. Again the slough is less adherent and comes off with less difficulty. No surrounding infection peripheral pulses are intact. 02/22/15 selective debridement of the right medial ankle wound. Slough comes off with less difficulty. No obvious surrounding infection peripheral pulses are intact I did not debridement the one on the left. Both of these are stable to improved 03/01/15 selective debridement of both wound areas using a curette to. Adherent slough cup soft with less difficulty. No obvious surrounding infection. The patient tells me that 2 days ago she noted a rash above the right leg wrap. She did not have this on her lower legs when she change this over she arrives with widespread left greater than right almost folliculitis-looking rash which is extremely pruritic. I don't see anything to culture here. There is no rash on the rest of her body. She feels well systemically. 03/08/15; selective debridement of both wounds using a curette. Base of this does not look unhealthy. She had limegreen drainage coming out of the left leg wound and describes a lot of drainage. The rash on her left leg looks improved to. No cultures were done. 03/22/15; patient was not here last week. Basal wounds does not look healthy and there is no surrounding erythema. No drainage. There is still a rash on the left leg that almost looks vasculitic however it is clearly limited to the top of where the wrap would be. 04/05/15; on the right required a surgical debridement of surface eschar and necrotic subcutaneous tissue. I did not debridement the area on the left. These continue to be large open wounds that are not changing that much. We were successful at one point in healing the area on the right, and at the same time the area on the left was roughly  half the size of current measurements. I think a lot of the deterioration has to do with the prolonged time the patient is on her feet at work 04/19/15 I attempted-like surface debridement bilaterally she does not tolerate this. She tells me that she was in allergic care yesterday with extreme pain over her left lateral malleolus/ankle and was told that she has an "sprain" 05/03/15; large bilateral venous insufficiency wounds over the medial malleolus/medial aspect of her ankles. She complains of copious amounts of drainage and his usual large amounts of pain. There is some increasing erythema around the wound on the right extending into the medial aspect of her foot to. historically she came in with these wounds the right one healed and the left one came down to roughly half its current size however the right one is reopened and the left is expanded. 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 05/10/15 large bilateral venous insufficiency wounds. There is less adherence surface left however the surface culture that I did last week grew pseudomonas therefore bilateral selective debridement score necessary. There is surrounding erythema. The patient describes severe bilateral drainage and a lot of pain in the left ankle. Apparently her podiatrist was were ready to do a cortisone shot 05/17/15; the patient complains of pain and again copious amounts of drainage. 05/24/15; we used Iodo flex last week. Patient notes considerable improvement in wound drainage. Only needed to change this once. 05/31/15; we continued Iodoflex; the base of these large wounds bilaterally is not too bad but there is probably likely a significant bioburden here. I would  like to debridement just doesn't tolerate it. 06/06/14 I would like to continue the Iodoflex although she still hasn't managed to obtain supplies. She has bilateral medial malleoli or large wounds which are mostly  superficial. Both of them are covered circumferentially with some nonviable fibrinous slough although she tolerates debridement very poorly. She apparently has an appointment for an ablation on the right leg by interventional radiology. 06/14/15; the patient arrives with the wounds and static condition. We attempted a debridement although she does not do well with this secondary to pain. I 07/05/15; wounds are not much smaller however there appears to be a cleaner granulating base. The left has tight fibrinous slough greater than the right. Debridement is tolerated poorly due to pain. Iodoflex is done more for these wounds in any of the multitude of different dressings I have tried on the left 1 and then subsequently the right. 07/12/15; no change in the condition of this wound. I am able to do an aggressive debridement on the right but not the left. She simply cannot tolerate it. We have been using Iodoflex which helps somewhat. It is worthwhile remembering that at one point we healed the right medial ankle wound and the left was about 25% of the current circumference. We have suggested returning to vascular surgery for review of possible further ablations for one reason or another she has not been able to do this. 07/26/15 no major change in the condition of either wound on her medial ankle. I did not attempt to debridement of these. She has been aggressively scrubbing these while she is in the shower at home. She has her supply of Iodoflex which seems to have done more for these wounds then anything I have put on recently. 08/09/15 wound area appears larger although not verified by measurements. Using Iodoflex 09/05/2015 -- she was here for avisit today but had significant problems with the wound and I was asked to see her for a physician opinion. I have summarize that this lady has had surgery on her left lower extremity about 10 years ago where the possible veins stripping was done. She has had  an opinion from interventional radiology around November 2016 where no further sclerotherapy was ordered. The patient works 12 hours a day and stands on a concrete floor with work boots and is unable to get the proper compression she requires and cannot elevate her limbs appropriately at any given time. She has recently grown Pseudomonas from her wound culture but has not started her ciprofloxacin which was called in for her. 09/13/15 this continues to be a difficult situation for this patient. At one point I had this wound down to a 1.5 x 1.5" wound on her left leg. This is deteriorated and the right leg has reopened. She now has substantial wounds on her medial calcaneus, malleoli and into her lower leg. One on the left has surface eschar but these are far too painful for me to debridement here. She has a vascular surgery appointment next week to see if anything can be done to help here. I think she has had previous ablations several years ago at [facility]. She has no major edema. She tells me that she did not get product last time [facility]] and went for several days without it. She continues to work in work boots 12 hours a day. She cannot get compression/4-layer under her work boots. 09/20/15 no major change. Periwound edema control was not very good. Her point with pain and vascular  is next Wednesday the 25th 09/28/15; the patient is seen vascular surgery and is apparently scheduled for repeat duplex ultrasounds of her bilateral lower legs next week. 10/05/15; the patient was seen by Dr. Doren Custard of vascular surgery. He feels that she should have arterial insufficiency excluded as cause/contributed to her nonhealing stage she is therefore booked for an arteriogram. She has apparently monophasic signals in the dorsalis pedis pulses. She also of course has known severe chronic venous insufficiency with previous procedures as noted previously. I had another long discussion with the patient today  about her continuing to work 12 hour shifts. I've written her out for 2 months area had concerns about this as her work location is currently undergoing significant turmoil and this may lead to her termination. She is aware of this however I agree with her that she simply cannot continue to stand for 12 hours multiple days a week with the substantial wound areas she has. 10/19/15; the Dr. Doren Custard appointment was largely for an arteriogram which was normal. She does not have an arterial issue. He didn't make a comment about her chronic venous insufficiency for which she has had previous ablations. Presumably it was not felt that anything additional could be done. The patient is now out of work as I prescribed 2 weeks ago. Her wounds look somewhat less aggravated presumably because of this. I felt I would give debridement another try today 10/25/15; no major change in this patient's wounds. We are struggling to get her product that she can afford into her own home through her insurance. 11/01/15; no major change in the patient's wounds. I have been using silver alginate as the most affordable product. I spoke to Dr. Marla Roe last week with her requested take her to the OR for surgical debridement and placement of ACEL. Dr. Marla Roe told me that she would be willing to do this however [facility] will not cover this, fortunately the patient has [place] healthcare of some variant 11/08/15; no major change in the patient's wounds. She has been completely nonviable surface that this but is in too much pain with any attempted debridement are clinic. I have arranged for her to see Dr. Marla Roe ham of plastic surgery and this appointment is on Monday. I am hopeful that they will take her to the OR for debridement, possible ACEL ultimately possible skin graft 11/22/15 no major change in the patient's wounds over her bilateral medial calcaneus medial malleolus into the lower legs. Surface on these does not  look too bad however on the left there is surrounding erythema and tenderness. This may be cellulitis or could him sleepy tinea. 11/29/15; no major changes in the patient's wounds over her bilateral medial malleolus. There is no infection here and I don't think any additional antibiotics are necessary. There is now plan to move forward. She sees Dr. Marla Roe in a week's time for preparation for operative debridement and ACEL placement I believe on 7/12. She then has a follow-up appointment with Dr. Marla Roe on 7/21 12/28/15; the patient returns today having been taken to the [facility] by Dr. Marla Roe 12/12/15 she underwent debridement, intraoperative cultures [which were negative]. She had placement of a wound VAC. Parent really ACEL was not available to be placed. The wound VAC foam apparently adhered to the wound since then she's been using silver alginate, Xeroform under Ace wraps. She still says there is a lot of drainage and a lot of pain 01/31/16; this is a patient I see monthly. I  had referred her to Dr. Marla Roe him of plastic surgery for large wounds on her bilateral medial ankles. She has been to the OR twice once in early July and once in early August. She tells me over the last 3 weeks she has been using the wound VAC with ACEL underneath it. On the right we've simply been using silver alginate. Under Kerlix Coban wraps. 02/28/16; this is a patient I'm currently seeing monthly. She is gone on to have a skin graft over her large venous insufficiency ulcer on the left medial ankle. This was done by Dr. Marla Roe him. The patient is a bit perturbed about why she didn't have one on her right medial ankle wound. She has been using silver alginate to this. 03/06/16; I received a phone call from her plastic surgery Dr. Marla Roe. She expressed some concern about the viability of the skin graft she did on the left medial ankle wound. Asked me to place Endoform on this. She told me she is not planning  to do a subsequent skin graft on the right as the left one did not take very well. I had placed Hydrofera Blue on the right 03/13/16; continue to have a reasonably healthy wound on the right medial ankle. Down to 3 mm in terms of size. There is epithelialization here. The area on the left medial ankle is her skin graft site. I suppose the last week this looks somewhat better. She has an open area inferiorly however in the center there appears to be some viable tissue. There is a lot of surface callus and eschar that will eventually need to come off however none of this looked to be infected. Patient states that the is able to keep the dressing on for several days which is an improvement. 03/20/16 no major change in the circumference of either wound however on the left side the patient was at Dr. Eusebio Friendly office and they did a debridement of left wound. 50% of the wound seems to be epithelialized. I been using Endoform on the left Hydrofera Blue in the right 03/27/16; she arrives today with her wound is not looking as healthy as they did last week. The area on the right clearly has an adherent surface to this a very similar surface on the left. Unfortunately for this patient this is all too familiar problem. Clearly the Endoform is not working and will need to change that today that has some potential to help this surface. She does not tolerate debridement in this clinic very well. She is changing the dressing wants 04/03/16; patient arrives with the wounds looking somewhat better especially on the right. Dr. Migdalia Dk change the dressing to silver alginate when she saw her on Monday and also sold her some compression socks. The usefulness of the latter is really not clear and woman with severely draining wounds. 04/10/16; the patient is doing a bit of an experiment wearing the compression stockings that Dr. Migdalia Dk provided her to her left leg and the out of legs based dressings that we provided to the  right. 05/01/16; the patient is continuing to wear compression stockings Dr. Migdalia Dk provided her on the left that are apparently silver impregnated. She has been using Iodoflex to the right leg wound. Still a moderate amount of drainage, when she leaves here the wraps only last for 4 days. She has to change the stocking on the left leg every night 05/15/16; she is now using compression stockings bilaterally provided by Dr. Marla Roe. She is wearing a  nonadherent layer over the wounds so really I don't think there is anything specific being done to this now. She has some reduction on the left wound. The right is stable. I think all healing here is being done without a specific dressing 06/09/16; patient arrives here today with not much change in the wound certainly in diameter to large circular wounds over the medial aspect of her ankle bilaterally. Under the light of these services are certainly not viable for healing. There is no evidence of surrounding infection. She is wearing compression stockings with some sort of silver impregnation as prescribed by Dr. Marla Roe. She has a follow-up with her tomorrow. 06/30/16; no major change in the size or condition of her wounds. These are still probably covered with a nonviable surface. She is using only her purchase stockings. She did see Dr. Marla Roe who seemed to want to apply Dakin's solution to this I'm not extreme short what value this would be. I would suggest Iodoflex which she still has at home. 07/28/16; I follow Mrs. Slick episodically along with Dr. Marla Roe. 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 She has been applying a topical collagen ointment to both wounds with Adaptic. I don't think Dr. Marla Roe is planning to take her back to the OR. 08/19/16; I follow Mrs. Jeneen Rinks on a monthly basis along with Dr. Marla Roe of plastic surgery. She has very refractory venous  insufficiency wounds on the bilateral medial lower legs left greater than right. I been following her for a number of years. At one point I was able to get the right medial malleolus wound to heal and had the left medial malleolus down to about half its current size however and I had to send her to plastic surgery for an operative debridement. Since then things have been stable to slightly improve the area on the right is slightly better one in the left about the same although there is much less adherent surface than I'm used to with this patient. She is using some form of liquid collagen gel that Dr. Marla Roe provided a Kerlix cover with the patient's own pressure stockings. She tells me that she has extreme pain in both ankles and along the lateral aspect of both feet. She has been unable to work for some period of time. She is telling me she is retiring at the beginning of April. She sees Dr. Doran Durand of orthopedics next week 09/22/16; patient has not seen Dr. Marla Roe since the last time she is here. I'm not really sure what she is using to the wounds other than bits and pieces of think she had left over including most recently Hydrofera Blue. She is using juxtalite stockings. She is having difficulty with her husband's recent illness "stroke". She is having to transport him to various doctors appointments. Dr. Marla Roe left her the option of a repeat debridement with ACEL however she has not been able to get the time to follow-up on this. She continues to have a fair amount of drainage out of these wounds with certainly precludes leaving dressings on all week 10/13/16; patient has not seen Dr. Marla Roe since she was last in our clinic. I'm not really sure what she is doing with the wounds, we did try to get her [facility] and I think she is actually using this most of the time. Because of drainage she states she has to change this every second day although this is an improvement from what  she used to do. She went to see Dr. Doran Durand who did not think she had a muscular issue with regards to her feet, he referred her to a neurologist and I think the appointment is sometime in June. I changed her back to Iodoflex which she has used in the past but not recently. 11/03/16; the patient has been using Iodoflex although she ran out of this. Still claims that there is a lot of drainage although the wound does not look like this. No surrounding erythema. She has not been back to see Dr. Marla Roe 11/24/16; the patient has been using Iodoflex again but she ran out of it 2 or 3 days ago. There is no major change in the condition of either one of these wounds in fact they are larger and covered in a thick adherent surface slough/nonviable tissue especially on the left. She does not tolerate mechanical debridement in our clinic. Going back to see Dr. Marla Roe of plastic surgery for an operative debridement would seem reasonable. 12/15/16; the patient has not been back to see Dr. Marla Roe. She is been dealing with a series of illnesses and her husband which of monopolized her time. She is been using Sorbact which we largely supplied. She states the drainage is bad enough that it maximum she can go 2-3 days without changing the dressing 01/12/2017 -- the patient has not been back for about 4 weeks and has not seen Dr. Marla Roe not does she have any appointment pending. 01/23/17; patient has not seen Dr. Marla Roe even though I suggested this previously. She is using Santyl that was suggested last week by Dr. Con Memos this Cost her $16 through her insurance which is indeed surprising 02/12/17; continuing Santyl and the patient is changing this daily. A lot of drainage. She has not been back to see plastic surgery she is using an Ace wrap. Our intake nurse suggested wrap around stockings which would make a good reasonable alternative 02/26/17; patient is been using Santyl and changing this daily due to  drainage. She has not been to see plastic surgery she uses in April Ace wrap to control the edema. She did obtain extremitease stockings but stated that the edema in her leg was to big for these 03/20/17; patient is using Santyl and Anasept. Surfaces looked better today the area on the right is actually measuring a little smaller. She has states she has a lot of pain in her feet and ankles and is asking for a consult to pain control which I'll try to help her with through our case manager. 04/10/17; the patient arrives with better-looking wound surfaces and is slightly smaller wound on the left she is using a combination of Santyl and Anasept. She has an appointment or at least as started in the pain control center associated with [facility] 05/14/17; this is a patient who I followed for a prolonged period of time. She has venous insufficiency ulcers on her bilateral medial ankles. At one point I had this down to a much smaller wound on the left however these reopened and we've never been able to get these to heal. She has been using Santyl and Anasept gel although 2 weeks ago she ran out of the Anasept gel. She has a stable appearance of the wound. She is going to the wound care [facility] at [facility]. They wanted do a nerve block/spinal block although she tells me she is reluctant to go forward with that. 05/21/17; this is a patient I have followed for  many years. She has venous insufficiency ulcers on her bilateral medial ankles. Chronic pain and deformity in her ankles as well. She is been to see plastic surgery as well as orthopedics. Using PolyMem AG most recently/Kerramax/ABDs and 2 layer compression. She has managed to keep this on and she is coming in for a nurse check to change the dressing on Tuesdays, we see her on Fridays 06/05/17; really quite a good looking surface and the area especially on the right medial has contracted in terms of dimensions. Well granulated  healthy-looking tissue on both sides. Even with an open curet there is nothing that even feels abnormal here. This is as good as I've seen this in quite some time. We have been using PolyMem AG and bringing her in for a nurse check 06/12/17; really quite good surface on both of these wounds. The right medial has contracted a bit left is not. We've been using PolyMem and AG and she is coming in for a nurse visit 06/19/17; we have been using PolyMem AG and bringing her in for a nurse check. Dimensions of her wounds are not better but the surfaces looked better bilaterally. She complained of bleeding last night and the left wound and increasing pain bilaterally. She states her wound pain is more neuropathic than just the wounds. There was some suggestion that this was radicular from her pain management doctor in talking to her it is really difficult to sort this out. 06/26/17; using PolyMem and AG and bringing her in for a nurse check as All of this and reasonably stable condition. Certainly not improved. The dimensions on the lateral part of the right leg look better but not really measuring better. The medial aspect on the left is about the same. 07/03/16; we have been using PolyMen AG and bringing her in for a nurse check to change the dressings as the wounds have drainage which precludes once weekly changing. We are using all secondary absorptive dressings.our intake nurse is brought up the idea of using a wound VAC/snap VAC on the wound to help with the drainage to see if this would result in some contraction. This is not a bad idea. The area on the right medial is actually looking smaller. Both wounds have a reasonable-looking surface. There is no evidence of cellulitis. The edema is well controlled 07/10/17; the patient was denied for a snap VAC by her insurance. The major issue with these wounds continues to be drainage. We are using wicked PolyMem AG and she is coming in for a nurse visit to change  this. The wounds are stable to slightly improved. The surface looks vibrant and the area on the right certainly has shrunk in size but very slowly 07/17/17; the patient still has large wounds on her bilateral medial malleoli. Surface of both of these wounds looks better. The dimensions seem to come and go but no consistent improvement. There is no epithelialization. We do not have options for advanced treatment products due to insurance issues. They did not approve of the wound VAC to help control the drainage. More recently we've been using PolyMem and AG wicked to allow drainage through. We have been bringing her in for a nurse visit to change this. We do not have a lot of options for wound care products and the home again due to insurance issues 07/24/17; the patient's wound actually looks somewhat better today. No drainage measurements are smaller still healthy-looking surface. We used silver collagen under PolyMen started last week. We  have been bringing her in for a dressing change 07/31/17; patient's wound surface continued to look better and I think there is visible change in the dimensions of the wound on the right. Rims of epithelialization. We have been using silver collagen under PolyMen and bringing her in for a dressing change. There appears to be less drainage although she is still in need of the dressing change 08/07/17. Patient's wound surface continues to look better on both sides and the area on the right is definitely smaller. We have been using silver collagen and PolyMen. She feels that the drainage has been it has been better. I asked her about her vascular status. She went to see Dr. Aleda Grana at [facility] and had some form of ablation. I don't have much detail on this. I haven't my notes from 2016 that she was not a candidate for any further ablation but I don't have any more information on this. We had referred her to vein and vascular I don't think she ever went. He does not  have a history of PAD although I don't have any information on this either. We don't even have ABIs in our record 08/14/17; we've been using silver collagen and PolyMen cover. And putting the patient and compression. She we are bringing her in as a nurse visit to change this because ofarge amount of drainage. We didn't the ABIs in clinic today since they had been done in many moons 1.2 bilaterally. She has been to see vein and vascular however this was at [facility] and she had ablation although I really don't have any information on this all seemed biking get a report. She is also been operatively debrided by plastic surgery and had a cell placed probably 8-12 months ago. This didn't have a major effect. We've been making some gains with current dressings 08/19/17-She is here in follow-up evaluation for bilateral medial malleoli ulcers. She continues to tolerate debridement very poorly. We will continue with recently changed topical treatment; if no significant improvement may consider switching to Iodosorb/Iodoflex. 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 She has been using silver collagen and PolyMem. I believe she has been used and tried on Iodoflex before. 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 These have been very refractory over time. We have been using silver collagen and PolyMen. She is coming in once a week for a doctor's and once a week for nurse visits. We are actually making some progress 09/18/17; the patient's wounds are smaller especially on the right medial. She arrives today to upset to consider even washing these off with Anasept which I think is been part of the reason this is been closing. We've been using collagen covered in PolyMen  otherwise. It is noted that she has a small area of folliculitis on the right medial calf that. As we are wrapping her legs I'll give her a short course of doxycycline to make sure this doesn't amount to anything. She is a long list of complaints today including imbalance, shortness of breath on exertion, inversion of her left ankle. With regards to the latter complaints she is been to see orthopedics and they offered her a tendon release surgery I believe but wanted her wounds to be closed first. I have recommended she go  see her primary physician with regards to everything else. 09/25/17; patient's wounds are about the same size. We have made some progress bilaterally although not in recent weeks. She will not allow me T wash these wounds with Anasept even if she is doing her cell. Wheeze we've been using collagen covered in PolyMen. Last week she had a small area of folliculitis this is now opened into a small wound. She completed 5 days of trimethoprim sulfamethoxazole 10/02/17; unfortunately the area on her left medial ankle is worse with a larger wound area towards the Achilles. The patient complains of a lot of pain. She will not allow debridement although visually I don't think there is anything to debridement in any case. We have been using silver collagen and PolyMen for several months now. Initially we are making some progress although I'm not really seeing that today. We will move back to [facility]. His admittedly this is a bit of a repeat however I'm hoping that his situation is different now. The patient tells me she had her leg on the left give out on her yesterday this is process some pain. 10/09/17; the patient is seen twice a week largely because of drainage issues coming out of the chronic medial bimalleolar wounds that are chronic. Last week the dimensions of the one on the left looks a little larger I changed her to [facility]. She comes in today with a history of terrible  pain in the bilateral wound areas. She will not allow debridement. She will not even allow a tissue culture. There is no surrounding erythema no no evidence of cellulitis. We have been putting her Kerlix Coban man. She will not allow more aggressive compression as there was a suggestion to put her in 3 layer wraps. 10/16/17; large wounds on her bilateral medial malleoli. These are chronic. Not much change from last week. The surface looks have healthy but absolutely no epithelialization. A lot of pain little less so of drainage. She will not allow debridement or even washing these off in the vigorous fashion with Anasept. 10/23/17; large wounds on her bilateral malleoli which are chronic. Some improvement in terms of size perhaps on the right since last time I saw these. She states that after we increased the 3 layer compression there was some bleeding, when she came in for a nurse visit she did not want 3 layer compression put back on about our nurse managed to convince her. She has known chronic venous visit issues and I'm hoping to get her to tolerate the 3 layer compression. using Hydrofera Blue 10/30/17; absolutely no change in the condition of either wound although we've had some improvement in dimensions on the right.. Attempted to put her in 3 layer compression she didn't tolerated she is back in 2 layer compression. We've been using Hydrofera Blue We looked over her past records. She had venous reflux studies in November 2016. There was no evidence of deep venous reflux on the right. Superficial vein did not show the greater saphenous vein at think this is been previously ablated the small saphenous vein was within normal limits. The left deep venous system showed no DVT the vessels were positive for deep venous reflux in the posterior tibial veins at the ankle. The greater saphenous vein was surgically absent small saphenous vein was within normal limits. She went to vein and vascular at  [facility]. I believe she had an ablation on the left greater saphenous vein. I'll update her reflux studies perhaps  ever reviewed by vein and vascular. We've made absolutely no progress in these wounds. Will also try to read and TheraSkins through her insurance 11/06/17; W the patient apparently has a 2 week follow-up with vein and vascular I like him to review the whole issue with regards to her previous vascular workup by Dr. Aleda Grana. We've really made no progress on these wounds in many months. She arrives today with less viable looking surface on the left medial ankle wound. This was apparently looking about the same on Tuesday when she was here for nurse visit. 11/13/17; deep tissue culture I did last time of the left lower leg showed multiple organisms without any predominating. In particular no Staphylococcus or group A strep were isolated. We sent her for venous reflux studies. She's had a previous left greater saphenous vein stripping and I think sclerotherapy of the right greater saphenous vein. She didn't really look at the lesser saphenous vein this both wounds are on the medial aspect. She has reflux in the common femoral vein and popliteal vein and an accessory vein on the right and the common femoral vein and popliteal vein on the left. I'm going to have her go to see vein and vascular just the look over things and see if anything else beside aggressive compression is indicated here. We have not been able to make any progress on these wounds in spite of the fact that the surface of the wounds is never look too bad. 11/20/17; no major change in the condition of the wounds. Patient reports a large amount of drainage. She has a lot of complaints of pain although enlisting her today I wonder if some of this at least his neuropathic rather than secondary to her wounds. She has an appointment with vein and vascular on 12/30/17. The refractory nature of these wounds in my mind at least  need vein and vascular to look over the wounds the recent reflux studies we did and her history to see if anything further can be done here. I also note her gait is deteriorated quite a bit. 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 She has a bilateral Trendelenburg gait. I wonder if this is neuropathic or perhaps multilevel radicular. 11/27/17; her wounds actually looks slightly better. Healthy-looking granulation tissue a scant amount of epithelialization. [place] healthcare will not pay for [employer]. They will play for tri layer Oasis and Dermagraft. This is not a diabetic ulcer. We'll try for the tri layer Oasis. She still complains of some drainage. She has a vein and vascular appointment on 12/30/17 12/04/17; the wounds visually look quite good. Healthy-looking granulation with some degree of epithelialization. We are still waiting for response to our request for trial to try layer Oasis. Her appointment with vascular to review venous and arterial issues isn't sold the end of July 7/31. Not allow debridement or even vigorous cleansing of the wound surface. 12/18/17; slightly smaller especially on the right. 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 We've been using Hydrofera Blue. We still are looking into triple layer Oasis through her insurance 01/08/18 on evaluation today patient's wound actually appears to be showing signs of good improvement at this point in time. She has been tolerating the dressing changes without complication. Fortunately there does not appear to be any evidence of infection at this point in time. We have been utilizing silver nitrate which does seem to be of benefit for her which is also good news. Overall I'm very happy  with how things seem to be both regards appearance as well as measurement. Patient did see Dr. Bridgett Larsson for evaluation on 12/30/17. In his assessment he felt that stripping would not likely add much more than chronic  compression to the patient's healing process. His recommendation was to follow-up in three months with Dr. Doren Custard if she hasn't healed in order to consider referral back to you and see vascular where she previously was in a trial and was able to get her wound to heal. I'll be see what she feels she when you staying compression and he reiterated this as well. 01/13/18 on evaluation today patient appears to actually be doing very well in regard to her bilateral medial malleolus ulcers. She seems to have tolerated the chemical cauterization with silver nitrate last week she did have some pain through that evening but fortunately states that I'll be see since it seems to be doing better she is overall pleased with the progress. 01/21/18; really quite a remarkable improvement since I've last seen these wounds. We started using silver nitrate specially on the islands of hyper granulation which for some reason her around the wound circumference. This is really done quite nicely. Primary dressing Hydrofera Blue under 4 layer compression. She seems to be able to hold out without a nurse rewrap. Follow-up in 1 week 01/28/18; we've continued the hydrofera blue but continued with chemical cauterization to the wound area that we started about a month ago for irregular hyper granulation. She is made almost stunning improvement in the overall wound dimensions. I was not really expecting this degree of improvement in these chronic wounds 02/05/18; we continue with Hydrofera Bluebut of also continued the aggressive chemical cauterization with silver nitrate. We made nice progress with the right greater than left wound. 02/12/18. We continued with Hydrofera Blue after aggressive chemical cauterization with silver nitrate. We appear to be making nice progress with both wound areas 02/19/2018; we continue with [facility] after washing the wounds vigorously with Anasept spray and chemical cauterization with silver nitrate.  We are making excellent progress. The area on the right's just about closed 02/26/2018. The area on the left medial ankle had too much necrotic debris today. I used a #5 curette we are able to get most of the soft. I continued with the silver nitrate to the much smaller wound on the right medial ankle she had a new area on her right lower pretibial area which she says was due to a role in her compression 03/05/2018; both wound areas look healthy. Not much change in dimensions from last week. I continue to use silver nitrate and Hydrofera Blue. The patient saw Dr. Doren Custard of vein and vascular. He felt she had venous stasis ulcers. He felt based on her previous arteriogram she should have adequate circulation for healing. Also she has deep venous reflux but really no significant correctable superficial venous reflux at this time. He felt we should continue with conservative management including leg elevation and compression 04/02/2018; since we last saw this woman about a month ago she had a fall apparently suffered a pelvic fracture. I did not look up the x-ray. Nevertheless because of pain she literally was bedbound for 2 weeks and had home health coming out to change the dressing. Somewhat predictably this is resulted in considerable improvement in both wound areas. The right is just about closed on the medial malleolus and the left is about half the size. 04/16/2018; both her wounds continue to go down in size.  Using Hydrofera Blue. 05/07/18; both her wounds appeared to be improving especially on the right where it is almost closed. We are using Hydrofera Blue 05/14/2018; slightly worse this week with larger wounds. Surface on the left medial not quite as good. We have been using Hydrofera Blue 05/21/18; again the wounds are slightly larger. Left medial malleolus slightly larger with eschar around the circumference. We have been using Hydrofera Blue undergoing a wraps for a prolonged period of time. This  got a lot better when she was more recumbent due to a fall and a back injury. I change the primary dressing the silver alginate today. She did not tolerate a 4 layer compression previously although I may need to bring this up with her next time 05/28/2018; area on the left medial malleolus again is slightly larger with more drainage. Area on the right is roughly unchanged. She has a small area of folliculitis on the right medial just on the lower calf. This does not look ominous. 06/03/2018 left medial malleolus slightly smaller in a better looking surface. We used silver nitrate on this last time with silver alginate. The area on the right appears slightly smaller 1/10; left medial malleolus slightly smaller. Small open area on the right. We used silver nitrate and silver alginate as of 2 weeks ago. We continue with the wound and compression. These got a lot better when she was off her feet 1/17; right medial malleolus wound is smaller. The left may be slightly smaller. Both surfaces look somewhat better. 1/24; both wounds are slightly smaller. Using silver alginate under Unna boots 1/31; both wounds appear smaller in fact the area on the right medial is just about closed. Surface eschar. We have been using silver alginate under Unna boots. The patient is less active now spends let much less time on her feet and I think this is contributed to the general improvement in the wound condition 2/7; both wounds appear smaller. I was hopeful the right medial would be closed however there there is still the same small open area. Slight amount of surface eschar on the left the dimensions are smaller there is eschar but the wound edges appear to be free. We have been using silver alginate under Unna boot's 2/14; both wounds once again measure smaller. Circumferential eschar on the left medial. We have been using silver alginate under Unna boots with gradual improvement 2/21; the area on the right medial  malleolus has healed. The area on the left is smaller. We have been using silver alginate and Unna boots. We can discharge wrapping the right leg she has 20/30 stockings at home she will need to protect the scar tissue in this area 2/28; the area on the right medial malleolus remains closed the patient has a compression stocking. The area on the left is smaller. We have been using silver alginate and Unna boots. 3/6 the area on the right medial ankle remains closed. Good edema control noted she is using her own compression stocking. The area on the left medial ankle is smaller. We have been managing this with silver alginate and Unna boots which we will continue today. 3/13; the area on the right medial ankle remains closed and I'm declaring it healed today. When necessary the left is about the same still a healthy-looking surface but no major change and wound area. No evidence of infection and using silver alginate under unna and generally making considerable improvement 3/27 the area on the right medial ankle remains closed the  area on the left is about the same as last week. Certainly not any worse we have been using silver alginate under an Unna boot 4/3; the area on the right medial ankle remains closed per the patient. We did not look at this wound. The wound on the left medial ankle is about the same surface looks healthy we have been using silver alginate under an Unna boot 4/10; area on the right medial ankle remains closed per the patient. We did not look at this wound. The wound on the left medial ankle is slightly larger. The patient complains that the [facility] caused burning pain all week. She also told us that she was a lot more active this week. Changed her back to silver alginate 4/17; right medial ankle still closed per the patient. Left medial ankle is slightly larger. Using silver alginate. She did not tolerate Hydrofera Blue on this area 4/24; right medial ankle remains  closed we have not look at this. The left medial ankle continues to get larger today by about a centimeter. We have been using silver alginate under Unna boots. She complains about 4 layer compression as an alternative. She has been up on her feet working on her garden 5/8; right medial ankle remains closed we did not look at this. The left medial ankle has increased in size about 100%. We have been using silver alginate under Unna boots. She noted increased pain this week and was not surprised that the wound is deteriorated 5/15; no major change in SA however much less erythema ( one week of doxy ocellulitis). 5/22-65 year old female returns at 1 week to the clinic for left medial ankle wound for which we have been using silver alginate under 3 layer compression She was placed on DOXY at last visit - the wound is wider at this visit. She is in 3 layer compression 5/29; change to [facility] last week. I had given her empiric doxycycline 2 weeks ago for a week. She is in 3 layer compression. She complains of a lot of pain and drainage on presentation today. 6/5; using Hydrofera Blue. I gave her doxycycline recently empirically for erythema and pain around the wound. Believe her cultures showed enterococcus which not would not have been well covered by doxycycline nevertheless the wound looks better and I don't feel specifically that the enterococcus needs to be covered. She has a new what looks like a wrap injury on her lateral left ankle. 6/12; she is using Hydrofera Blue. She has a new area on the left anterior lower tibial area. This was a wrap injury last week. 6/19; the patient is using Hydrofera Blue. She arrived with marked inflammation and erythema around the wound and tenderness. 12/01/18 on evaluation today patient appears to be doing a little bit better based on what I'm hearing from the standpoint of lassos evaluation to this as far as the overall appearance of the wound is concerned.  Then sometime substandard she typically sees Dr. Dellia Nims. Nonetheless overall very pleased with the progress that she's made up to this point. No fevers, chills, nausea, or vomiting noted at this time. 7/10; some improvement in the surface area. Aggressively debrided last week apparently. I went ahead with the debridement today although the patient does not tolerate this very well. We have been using Iodoflex. Still a fair amount of drainage 7/17; slightly smaller. Using Iodoflex. 7/24; no change from last week in terms of surface area. We have been using Iodoflex. Surface looks and continues  to look somewhat better 7/31; surface area slightly smaller better looking surface. We have been using Iodoflex. This is under Unna boot compression 8/7-Patient presents at 1 week with Unna boot and Iodoflex, wound appears better 8/14-Patient presents at 1 week with Iodoflex, we use the Unna boot, wound appears to be stable better.Patient is getting Botox treatment for the inversion of the foot for tendon release, Next week 8/21; we are using Iodoflex. Unna boot. The wound is stable in terms of surface area. Under illumination there is some areas of the wound that appear to be either epithelialized or perhaps this is adherent slough at this point I was not really clear. It did not wipe off and I was reluctant to debride this today. 8/28; we are using Iodoflex in an Unna boot. Seems to be making good improvement. 9/4; using Iodoflex and wound is slightly smaller. 9/18; we are using Iodoflex with topical silver nitrate when she is here. The wound continues to be smaller 10/2; patient missed her appointment last week due to GI issues. She left and Iodoflex based dressing on for 2 weeks. Wound is about the same size about the size of a dime on the left medial lower 10/9 we have been using Iodoflex on the medial left ankle wound. She has a new superficial probable wrap injury on the dorsal left ankle 10/16; we have  been using Hydrofera Blue since last week. This is on the left medial ankle 10/23; we have been using Hydrofera Blue since 2 weeks ago. This is on the left medial ankle. Dimensions are better 11/6; using Hydrofera Blue. I think the wound is smaller but still not closed. Left medial ankle 11/13; we have been using Hydrofera Blue. Wound is certainly no smaller this week. Also the surface not as good. This is the remanent of a very large area on her left medial ankle. 11/20; using Sorbact since last week. Wound was about the same in terms of size although I was disappointed about the surface debris 12/11; 3-week follow-up. Patient was on vacation. Wound is measuring slightly larger we have been using Sorbact. 12/18; wound is about the same size however surface looks better last week after debridement. We have been using Sorbact under compression 1/15 wound is probably twice the size of last time increased in length nonviable surface. We have been using Sorbact. She was running a mild fever and missed her appointment last week 1/22; the wound is come down in size but under illumination still a very adherent debris we have been Hydrofera Blue that I changed her to last week 1/29; dimensions down slightly. We have been using Hydrofera Blue 2/19 dimensions are the same however there is rims of epithelialization under illumination. Therefore more the surface area may be epithelialized 2/26; the patient's wound actually measures smaller. The wound looks healthy. We have been using Hydrofera Blue. I had some thoughts about running Apligraf then I still may do that however this looks so much better this week we will delay that for now 3/5; the wound is small but about the same as last week. We have been using Hydrofera Blue. No debridement is required today. 3/19; the wound is about the size of a dime. Healthy looking wound even under illumination. We have been using Hydrofera Blue. No mechanical  debridement is necessary 3/26; not much change from last week although still looks very healthy. We have been using Hydrofera Blue under Unna boots Patient was offered an ankle fusion by podiatry but  not until the wound heals with a proceed with this. 4/9; the patient comes in today with her original wound on the medial ankle looking satisfactory however she has some uncontrolled swelling in the middle part of her leg with 2 new open areas superiorly just lateral to the tibia. I think this was probably a wrap issue. She said she felt uncomfortable during the week but did not call in. We have been using Hydrofera Blue 4/16; the wound on the medial ankle is about the same. She has innumerable small areas superior to this across her mid tibia. I think this is probably folliculitis. She is also been working in the yard doing a lot of sweating 4/30; the patient issue on the upper areas across her mid tibia of all healed. I think this was excessive yard work if I remember. Her wound on the medial ankle is smaller. Some debris on this we have been using Hydrofera Blue under Unna boots 5/7; mid tibia. She has been using Hydrofera Blue under an Unna wrap. She is apparently going for her ankle surgery on June 3 10/28/19-Patient returns to clinic with the ankle wound, we are using Hydrofera Blue under Unna wrap, surgery is scheduled for her left foot for June 3 so she will be back for nurse visit next week READMISSION 01/17/2020 Mrs. Whatley is a 65 year old woman we have had in this clinic for a long period of time with severe venous hypertension and refractory wounds on her medial lower legs and ankles bilaterally. 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 When she was off her legs for a prolonged period example when she fell and suffered a compression fracture things would heal up quite  nicely. She is now retired and we managed to heal up the right medial leg wound. The left one was very tiny last time I saw this although still refractory. 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 It got to the point where this was interfering with her walking and she elected to proceed with a ankle arthrodesis to straighten her her ankle and leave her with a functional outcome for mobilization. The patient was referred to Dr. Doren Custard and really this took some time to arrange. Dr. Doren Custard saw her on 12/07/2019. Once again he verified that she had no arterial issues. She had previously had an angiogram several years ago. Follow-up ABIs on the left showed an ABI of 1.12 with triphasic waveforms and a TBI of 0.92. She is felt to have chronic deep venous insufficiency but I do not think it was felt that anything could be done from about this from an ablation point of view. At the time Dr. Doren Custard saw this patient the wounds actually look closed via the pictures in his clinic. The patient finally underwent her surgery on 12/15/2019. This went reasonably well and there was a good anatomic outcome. She developed a small distal wound dehiscence on the lateral part of the surgical wound. However more problematically she is developed recurrence of the wound on the medial left ankle. There are actually 2 wounds here one in the distal lower leg and 1 pretty much at the level of the medial malleolus. It is a more distal area that is more problematic. She has been using Hydrofera Blue which started  on Friday before this she was simply Ace wrapping. There was a culture done that showed Pseudomonas and she is on ciprofloxacin. A recent CNS on 8/11 was negative. The patient reports some pain but I generally think this is improving. She is using a cam boot completely nonweightbearing using a walker for pivot transfers and a  wheelchair 8/24; not much improvement unfortunately she has a surgical wound on the lateral part in the venous insufficiency wound medially. The bottom part of the medial insufficiency wound is still necrotic there is exposed tendon here. We have been using Hydrofera Blue under compression. Her edema control is however better 8/31; patient in for follow-up of his surgical wound on the lateral part of her left leg and chronic venous insufficiency ulcers medially. We put her back in compression last week. She comes in today with a complaint of 3 or 4 days [name] of increasing pain. She felt her cam walker was rubbing on the area on the back of her heel. However there is intense erythema seems more likely she has cellulitis. She had 2 cultures done when she was seeing podiatry in the postop. One of them in late July showed Pseudomonas and she received a course of ciprofloxacin the other was negative on 8/11 she is allergic to penicillin with anaphylactoid complaints of hives oral swelling via information in epic 9/9; when I saw this patient last week she had intense anterior erythema around her wound on the right lateral heel and ankle and also into the right medial heel. Some of this was no doubt drainage and her walker boot however I was convinced she had cellulitis. I gave her Levaquin and Bactrim she is finishing up on this now. She is following up with Dr. [name] he saw her yesterday. He is taken her out of the walking boot of course she is still nonweightbearing. Her x-ray was negative for any worrisome features such as soft tissue air etc. Things are a lot better this week. She has home health. We have been using Hydrofera Blue under an [employer] which she put back on yesterday. I did not wrap her last week 9/17; her surrounding skin looks a lot better. In fact the area on the left lateral ankle has just a scant amount of eschar. The only remaining wound is the large area on the left medial ankle.  Probably about 60% of this is healthy granulation at the surface however she has a significant divot distally. This has adherent debris in it. I been using debridement and silver collagen to try and get this area to fill-in although I do not think we have made much progress this week 9/24; the patient's wound on the left medial ankle looks a lot better. The deeper divot area distally still requires debridement but this is cleaning up quite nicely we have been using silver collagen. The patient is complaining of swelling in her foot and is worried that that is contributing to the nonhealing of the ankle wound. She is also complaining of numbness in her anterior toes 10/4; left medial ankle. The small area distally still has a divot with necrotic material that I have been debriding away. This has an undermining area. She is approved for Apligraf. She saw Dr. [name] her surgeon on 10/1. I think he declared himself is satisfied with the condition of things. Still nonweightbearing till the end of the month. 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 Her surgical wound is well  closed. There is no evidence of infection 10/11; the patient arrived in clinic today with the expectation that we be able to put an Apligraf on this area after debridement however she arrives with a relatively large amount of green drainage on the dressing. The patient states that this started on Friday. She has not been systemically unwell. 10/19; culture I did last week showed both Enterococcus and Pseudomonas. I think this came in separate parts because I stopped her ciprofloxacin I gave her and prescribed her linezolid however now looking at the final culture result this is Pseudomonas which is resistant to quinolones. She has not yet picked up the linezolid apparently phone issues. We are also trying to get a topical antibiotic out of [place] in [place] they can be applied by home health. She  is still having green drainage 10/16; the patient has her topical antibiotic from [facility] in [place]. This is a compounded gel with vancomycin and ciprofloxacin and gentamicin. We are applying this on the wound bed with silver alginate over the top with Unna boot wraps. She arrives in clinic today with a lot less ominous looking drainage although she is only use this topical preparation once the second time today. She sees Dr. [name] her surgeon on Friday she has home health changing the dressing 11/2; still using her compounded topical antibiotic under silver alginate. Surface is cleaning up there is less drainage. We had an Apligraf for her today and I elected to apply it. A light coating of her antibiotic 04/25/2020 upon evaluation today patient appears to be doing well with regard to her ankle ulcer. There is a little bit of slough noted on the surface of the wound I am can have to perform sharp debridement to clear this away today. With that being said other than that fact overall I feel like she is making progress and we do see some new epithelial growth. There is also some improvement in the depth of the wound and that distal portion. There is little bit of slough there as well. 12/7; 2-week follow-up. Apligraf #3. Dimensions are smaller. Closing in especially inferiorly. Still some surface debris. Still using the [facility] topical antibiotic but I told her that I don't think this needs to be renewed 12/21; 2-week follow-up. Apligraf #4 dimensions are smaller. Nice improvement 06/05/2020; 2-week follow-up. The patient's wound on the left medial ankle looks really excellent. Nice granulation. Advancing epithelialization no undermining no evidence of infection. We would have to reapply for another Apligraf but with the condition of this wound I did not feel strongly about it. We used Hydrofera Blue under the same degree of compression. She follows up with Dr. [name] her surgeon a week  Friday 06/13/2020 upon evaluation today patient appears to be doing excellent in regard to her wound. She has been tolerating the dressing changes without complication. Fortunately there is no signs of active infection at this time. No fevers, chills, nausea, vomiting, or diarrhea. She was using Hydrofera Blue last week. 06/20/2020 06/20/2020 on evaluation today patient actually appears to be doing excellent in regard to her wound. This is measuring better and looking much better as well. She has been using the collagen that seems to be doing better for her as well even though the [facility] was and is not sticking or feeling as rough on her wound. She did see Dr. [name] on Friday he is very pleased he also stated none of the hardware has shifted. That is great news 1/27;  the patient has a small clean wound all that is remaining. I agree that this is too small to really consider an Apligraf. Under illumination the surface is looking quite good. We have been using collagen although the dimensions are not any better this week 2/2; the patient has a small clean wound on the left medial ankle. Although this left of her substantial original areas. Measurements are smaller. We have been using polymen Ag under an Haematologist. 2/10; small area on the left medial ankle. This looks clean nothing to debride however dimensions are about the same we have been using polymen I think now for 2 weeks 2/17; not much change in surface area. We have been using polymen Ag without any improvement. 3/17; 1 month follow-up. The patient has been using endoform without any improvement in fact I think this looks worse with more depth and more expansion 3/24; no improvement. Perhaps less debris on the surface. We have been using Sorbact for 1 week 4/4; wound measures larger. She has edema in her leg and her foot which she tells as her wrap came down. We have been using Unna boots. Sorbact of the wound. She has been approved for  Apligraf 09/12/2020 upon evaluation today patient appears to be doing well with regard to her wound. We did get the Apligraf reapproved which is great news we have that available for application today. Fortunately there is no signs of infection and overall the patient seems to be doing great. The wound bed is nice and clean. 4/27; patient presents for her second application of Apligraf. She states over the past week she has been on her feet more often due to being outside in her garden. She has noted more swelling to her foot as a result. She denies increased warmth, pain or erythema to the wound site. 10/10/2020 upon evaluation today patient appears to be doing well with regard to her wound which does not appear to be quite as irritated as last week from what I am hearing. With that being said unfortunately she is having issues with some erythema and warmth to touch as well as an increase size. I do believe this likely is infected. 10/17/2020 upon evaluation today patient appears to be doing excellent in regard to her wound this is significantly improved as compared to last week. Fortunately I think that the infection is much better controlled at this point. She did have evidence of both Enterococcus as well as Staphylococcus noted on culture. Enterococcus really would not be helped significantly by the Cipro but the wound is doing so much better I am under the assumption that the Staphylococcus is probably the main organism that is causing the current infection. Nonetheless I think that she is doing excellent as far as that is concerned and I am very pleased in that regard. I would therefore recommend she continue with the Cipro. 10/31/2020 upon evaluation today patient appears to be doing well with regard to her wound. She has been tolerating the dressing changes without complication. Fortunately there is no signs of active infection and overall I am extremely pleased with where things stand today. No  fevers, chills, nausea, vomiting, or diarrhea. With that being said she does have some green drainage coming from the wound and although it looks okay I am a little concerned about the possibility of a continuing infection. Specifically with Pseudomonas. For that reason I will go ahead and send in a prescription for Cipro for her to be continued. 11/14/2020 upon  evaluation today patient appears to be doing very well currently in regard to her wound on her leg. She has been tolerating the dressing changes without complication. Fortunately I feel like the infection is finally under good control here. Unfortunately we do not have the Apligraf for application today although we can definitely order to have it in place for next week. That will be her fifth and final of the current series. Nonetheless I feel like her wound is really doing quite well which is great news. 11/21/2020 upon evaluation today patient appears to be doing well with regard to her wound on the medial ankle. Fortunately I think the infection is under control and I do believe we can go ahead and reapply the Apligraf today. She is in agreement with that plan. There does not appear to be any signs of active infection at this time which is great news. No fevers, chills, nausea, vomiting, or diarrhea. 12/05/2020 upon evaluation today patient's wound bed actually showed signs of good granulation epithelization at this point. There does not appear to be any signs of infection which is great news and overall very pleased with where things stand. Overall the patient seems to be doing fairly well in my opinion with regard to her wound although I do believe she continues to build up a lot of biofilm I think she could benefit from using PuraPly at this point. 12/12/2020 upon evaluation today patient's wound actually appears to be doing decently well today. The Unna boot has not been quite as well-tolerated so that more uncomfortable for her and even  causing some pressure over the plate on the lateral portion of her foot which is 90 where the wound is. There did not appear to be any significant deep tissue injury with that there may be a minimal change in the skin noted I think that we may want to go back to the Coflex 2 layer which is a little bit easier on her skin it seems. 12/19/2020 upon evaluation today patient actually seems to be making great progress with the PuraPly currently. She in fact seems to be much better as far as the overall appearance of the wound bed is concerned I am very happy in this regard. I do not see any signs of of infection which is great news as well. No fevers, chills, nausea, vomiting, or diarrhea. 12/26/2020 upon evaluation today patient appears to actually be doing better in regard to her wound on the left medial ankle region. The surface of the wound is actually doing significantly better which is great news. There does not appear to be any signs of infection which is also great news and in general I am extremely pleased with where we stand today. 01/02/2021 upon evaluation today patient appears to be doing well with regard to her wound. In fact this is showing signs of excellent improvement and very pleased with where things stand. In fact the last 3 appointments have all shown signs of this getting smaller which is excellent news. I have not even had to perform any debridement and today is no exception. Overall I feel like this is dramatically improved compared to previous. T oday is PuraPly application #4. [id number] upon evaluation today patient appears to be doing excellent in regard to her wound this is continue to show signs of improvement and overall I am extremely pleased with where we stand today. She is actually here for PuraPly application #5. Every time we have applied this she is noted definite  improvement on measurements. 01/23/2021 upon evaluation today patient is actually making good progress in  regard to her wound. This was actually on just a little bit longer this time compared to previous due to the fact that she did have to go out of town. She is actually here for PuraPly application #6. We have definitely been seeing improvements in the overall quality of the tissue on the surface of the wound which is awesome news. In general I think that the patient seems to be continuing to make great progress here. 01/30/2021 upon evaluation today patient's wound is actually doing excellent. There is really not any significant biofilm buildup which is great news and overall I am extremely pleased with where things stand today. There does not appear to be any signs of active infection. No fevers, chills, nausea, vomiting, or diarrhea. 02/06/2021 upon evaluation today patient's wound is actually showing signs of excellent improvement which is great news. We continue to see the benefit of the PuraPly this is doing a great job the wound seems not really irritated whatsoever and is showing signs of good granulation at this point. Overall I am extremely happy with what we are seeing. The patient likewise is happy to hear all of this as well. 02/13/2021 upon evaluation today patient appears to be doing well with regard to her wound. This again is measuring smaller today and I am very pleased in this regard. Fortunately there does not appear to be any signs of active infection at this time which is good news from a systemic standpoint. Locally there is still some significant drainage which she does have is concerned about infection locally. No fevers, chills, nausea, vomiting, or diarrhea. 02/20/2021 upon evaluation today patient actually appears to be doing decently well in regard to her wound. She has been tolerating the dressing changes without complication. I do believe the PuraPly is helping wound bed does appear to be doing better. There is no evidence of active infection locally or systemically at this  point visually although on fluorescence imaging there still appear to be bacterial load and bioburden noted. 02/27/2021 upon evaluation today patient fortunately continues to show signs of improvement with use of the PuraPly currently. Subsequently we did review her culture results and to be honest I think that the Bactrim is probably the best option to have her continue at this point. For that reason I am get a go ahead and send in a refill today for her for an additional 2 weeks. Nonetheless I think that we are making excellent progress here. It was Enterococcus and Staphylococcus that were noted she is allergic to penicillin so there is not much I can do from the Enterococcus standpoint though the staff does seem to be sensitive to the Bactrim. Electronic Signature(s) Signed: 03/01/2021 4:11:55 PM By: [name] PA-C Entered By: Worthy [name] on 03/01/2021 16:11:54 -------------------------------------------------------------------------------- Physical Exam Details Patient Name: Date of Service: [facility] [name], [name] 02/27/2021 10:00 A M Medical Record Number: [id number] Patient Account Number: [id number] Date of Birth/Sex: Treating RN: 02/17/1956 (65 y.o. [name] Primary Care Provider: Lennie [name] Other Clinician: Referring Provider: Treating Provider/Extender: [name] in Treatment: 1 Constitutional Well-nourished and well-hydrated in no acute distress. Respiratory normal breathing without difficulty. Psychiatric this patient is able to make decisions and demonstrates good insight into disease process. Alert and Oriented x 3. pleasant and cooperative. Notes Upon inspection patient's wound bed did not even require sharp debridement today  it seems to be doing so well I am extremely pleased in this regard. Subsequently I did go ahead and perform the PuraPly application today since we are continuing to see improvement with this and I do  believe it is helping to control her overall bacterial load. Subsequently I did not secure this with Steri-Strips as her skin does much better without the additional injury from the [place] application. She always seems to have a reaction to this. Electronic Signature(s) Signed: 03/01/2021 4:12:48 PM By: [name] PA-C Previous Signature: 03/01/2021 4:12:15 PM Version By: [name] PA-C Entered By: Worthy [name] on 03/01/2021 16:12:47 -------------------------------------------------------------------------------- Physician Orders Details Patient Name: Date of Service: [name], [name] 02/27/2021 10:00 A M Medical Record Number: [id number] Patient Account Number: [id number] Date of Birth/Sex: Treating RN: 1955/08/31 (65 y.o. [name] Primary Care Provider: Lennie [name] Other Clinician: Referring Provider: Treating Provider/Extender: [name] in Treatment: 10 Verbal / Phone Orders: No Diagnosis Coding ICD-10 Coding Code Description [phone] Chronic venous hypertension (idiopathic) with ulcer and inflammation of left lower extremity L97.828 Non-pressure chronic ulcer of other part of left lower leg with other specified severity L97.328 Non-pressure chronic ulcer of left ankle with other specified severity Follow-up Appointments ppointment in 1 week. - with Margarita Grizzle Return A Cellular or Tissue Based Products Cellular or Tissue Based Product Type: - puraply AM#11 daptic or Mepitel. (DO NOT REMOVE). - no Cellular or Tissue Based Product applied to wound bed, secured with steri-strips, cover with A steristrips Bathing/ Shower/ Hygiene May shower with protection but do not get wound dressing(s) wet. Edema Control - Lymphedema / SCD / Other Elevate legs to the level of the heart or above for 30 minutes daily and/or when sitting, a frequency of: - throughout the day Avoid standing for long periods of time. Exercise  regularly Compression stocking or Garment 20-30 mm/Hg pressure to: - right leg daily Additional Orders / Instructions Follow Nutritious Diet Wound Treatment Wound #15 - Malleolus Wound Laterality: Left, Medial Cleanser: Soap and Water 1 x Per Week/30 Days Discharge Instructions: May shower and wash wound with dial antibacterial soap and water prior to dressing change. Peri-Wound Care: Triamcinolone 15 (g) 1 x Per Week/30 Days Discharge Instructions: Use triamcinolone 15 (g) as directed Peri-Wound Care: Sween Lotion (Moisturizing lotion) 1 x Per Week/30 Days Discharge Instructions: Apply moisturizing lotion as directed Prim Dressing: PuraPly AM ary 1 x Per Week/30 Days Secondary Dressing: Woven Gauze Sponge, Non-Sterile 4x4 in 1 x Per Week/30 Days Discharge Instructions: to bolster skin sub Secondary Dressing: ABD Pad, 5x9 1 x Per Week/30 Days Discharge Instructions: Apply over primary dressing and lateral ankle Secondary Dressing: Optifoam Non-Adhesive Dressing, 4x4 in 1 x Per Week/30 Days Discharge Instructions: Apply over primary dressing pad lateral foot with foam donut Secondary Dressing: ADAPTIC TOUCH 3x4.25 in 1 x Per Week/30 Days Discharge Instructions: Apply over skin sub Compression Wrap: CoFlex TLC XL 2-layer Compression System 4x7 (in/yd) 1 x Per Week/30 Days Discharge Instructions: Apply CoFlex 2-layer compression as directed. (alt for 4 layer) Patient Medications llergies: penicillin, doxycycline A Notifications Medication Indication Start End 02/27/2021 Bactrim DS DOSE 1 - oral 800 mg-160 mg tablet - 1 tablet oral taken 2 times per day for 14 days Electronic Signature(s) Signed: 02/27/2021 4:48:54 PM By: [name] PA-C Entered By: Worthy [name] on 02/27/2021 16:48:53 -------------------------------------------------------------------------------- Problem List Details Patient Name: Date of Service: [name], [name] 02/27/2021 10:00  A M Medical Record  Number: [id number] Patient Account Number: [id number] Date of Birth/Sex: Treating RN: [birth year] (65 y.o. 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 01/17/2020 No Yes Inactive Problems ICD-10 Code Description Active Date Inactive Date L03.116 Cellulitis of left lower limb 01/31/2020 01/31/2020 T81.31XD Disruption of external operation (surgical) wound, not elsewhere classified, subsequent 01/17/2020 01/17/2020 encounter Resolved Problems Electronic Signature(s) Signed: 02/27/2021 11:04:46 AM By: [name] PA-C Entered By: Worthy [name] on 02/27/2021 11:04:46 -------------------------------------------------------------------------------- Progress Note Details Patient Name: Date of Service: [facility] [name], [name] 02/27/2021 10:00 A M Medical Record Number: [id number] Patient Account Number: [id number] Date of Birth/Sex: Treating RN: 1956-01-18 (65 y.o. [name] Primary Care Provider: Lennie [name] Other Clinician: Referring Provider: Treating Provider/Extender: [name] in Treatment: 23 Subjective Chief Complaint Information obtained from Patient patient is been followed long-term in this clinic for venous insufficiency ulcers with inflammation, hypertension and ulceration over the medial ankle bilaterally. 01/17/2020; this is a patient who is here for review of postoperative wounds on the left lateral ankle and recurrence of  venous stasis ulceration on the left medial History of Present Illness (HPI) the remaining wound is over the left medial ankle. Similar wound over the right medial ankle healed largely with use of Apligraf. Most recently we have been using Hydrofera Blue over this wound with considerable improvement. The patient has been extensively worked up in the past for her venous insufficiency and she is not a candidate for antireflux surgery although I have none of the details available currently. 08/24/14; considerable improvement today. About 50% of this wound areas now epithelialized. The base of the wound appears to be healthier granulation.as opposed to last week when she had deteriorated a considerable improvement 08/17/14; unfortunately the wound has regressed somewhat. The areas of epithelialization from the superior aspect are not nearly as healthy as they were last week. The patient thinks her Hydrofera Blue slipped. 09/07/14; unfortunately the area has markedly regressed in the 2 weeks since I've seen this. There is an [name] surrounding erythema. The healthy granulation tissue that we had at the base of the wound now is a dusky color. The nurse reports green drainage 09/14/14; the area looks somewhat better than last week. There is less erythema and less drainage. The culture I did did not show any growth. Nevertheless I think it is better to continue the Cipro and doxycycline for a further week. The remaining wound area was debridement. 09/21/14. Wound did not require debridement last week. Still less erythema and less drainage. She can complete her antibiotics. The areas of epithelialization in the superior aspect of the wound do not look as healthy as they did some weeks ago 10/05/14 continued improvement in the condition of this wound. There is advancing epithelialization. Less aggressive debridement required 10/19/14 continued improvement in the condition and volume of this wound. Less aggressive  debridement to the inferior part of this to remove surface slough and fibrinous eschar 11/02/14 no debridement is required. The surface granulation appears healthy although some of her islands of epithelialization  seem to have regressed. No evidence of infection 11/16/14; lites surface debridement done of surface eschar. The wound does not look to be unhealthy. No evidence of infection. Unfortunately the patient has had podiatry issues in the right foot and for some reason has redeveloped small surface ulcerations in the medial right ankle. Her original presentation involved wounds in this area 11/23/14 no debridement. The area on the right ankle has enlarged. The left ankle wound appears stable in terms of the surface although there is periwound inflammation. There has been regression in the amount of new skin 11/30/14 no debridement. Both wound areas appear healthy. There was no evidence of infection. The the new area on the right medial ankle has enlarged although that both the surfaces appear to be stable. 12/07/14; Debridement of the right medial ankle wound. No no debridement was done on the left. 12/14/14 no major change in and now bilateral medial ankle wounds. Both of these are very painful but the no overt evidence of infection. She has had previous venous ablation 12/21/14; patient states that her right medial ankle wound is considerably more painful last week than usual. Her left is also somewhat painful. She could not tolerate debridement. The right medial ankle wound has fibrinous surface eschar 12/28/14 this is a patient with severe bilateral venous insufficiency ulcers. For a considerable period of time we actually had the one on the right medial ankle healed however this recently opened up again in June. The left medial ankle wound has been a refractory area with some absent flows. We had some success with Hydrofera Blue on this area and it literally closed by 50% however it is recently  opened up Foley. Both of these were debridement today of surface eschar. She tolerates this poorly 01/25/15: No change in the status of this. Thick adherent escar. Very poor tolerance of any attempt at debridement. I had healed the right medial malleolus wound for a considerable amount of time and had the left one down to about 50% of the volume although this is totally regressed over the last 48 weeks. Further the right leg has reopened. she is trying to make a appointment with pain and vascular, previous ablations with Dr. Aleda Grana. I do not believe there is an arterial insufficiency issue here 02/01/15 the status of the adherent eschar bilaterally is actually improved. No debridement was done. She did not manage to get vascular studies done 02/08/15 continued debridement of the area was done today. The slough is less adherent and comes off with less pressure. There is no surrounding infection peripheral pulses are intact 02/15/15 selective debridement with a disposable curette. Again the slough is less adherent and comes off with less difficulty. No surrounding infection peripheral pulses are intact. 02/22/15 selective debridement of the right medial ankle wound. Slough comes off with less difficulty. No obvious surrounding infection peripheral pulses are intact I did not debridement the one on the left. Both of these are stable to improved 03/01/15 selective debridement of both wound areas using a curette to. Adherent slough cup soft with less difficulty. No obvious surrounding infection. The patient tells me that 2 days ago she noted a rash above the right leg wrap. She did not have this on her lower legs when she change this over she arrives with widespread left greater than right almost folliculitis-looking rash which is extremely pruritic. I don't see anything to culture here. There is no rash on the rest of her body. She feels well systemically. 03/08/15; selective debridement  of both wounds  using a curette. Base of this does not look unhealthy. She had limegreen drainage coming out of the left leg wound and describes a lot of drainage. The rash on her left leg looks improved to. No cultures were done. 03/22/15; patient was not here last week. Basal wounds does not look healthy and there is no surrounding erythema. No drainage. There is still a rash on the left leg that almost looks vasculitic however it is clearly limited to the top of where the wrap would be. 04/05/15; on the right required a surgical debridement of surface eschar and necrotic subcutaneous tissue. I did not debridement the area on the left. These continue to be large open wounds that are not changing that much. We were successful at one point in healing the area on the right, and at the same time the area on the left was roughly half the size of current measurements. I think a lot of the deterioration has to do with the prolonged time the patient is on her feet at work 04/19/15 I attempted-like surface debridement bilaterally she does not tolerate this. She tells me that she was in allergic care yesterday with extreme pain over her left lateral malleolus/ankle and was told that she has an "sprain" 05/03/15; large bilateral venous insufficiency wounds over the medial malleolus/medial aspect of her ankles. She complains of copious amounts of drainage and his usual large amounts of pain. There is some increasing erythema around the wound on the right extending into the medial aspect of her foot to. historically she came in with these wounds the right one healed and the left one came down to roughly half its current size however the right one is reopened and the left is expanded. 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 05/10/15 large bilateral venous insufficiency wounds. There is less adherence surface left however the surface culture that I did last week grew  pseudomonas therefore bilateral selective debridement score necessary. There is surrounding erythema. The patient describes severe bilateral drainage and a lot of pain in the left ankle. Apparently her podiatrist was were ready to do a cortisone shot 05/17/15; the patient complains of pain and again copious amounts of drainage. 05/24/15; we used Iodo flex last week. Patient notes considerable improvement in wound drainage. Only needed to change this once. 05/31/15; we continued Iodoflex; the base of these large wounds bilaterally is not too bad but there is probably likely a significant bioburden here. I would like to debridement just doesn't tolerate it. 06/06/14 I would like to continue the Iodoflex although she still hasn't managed to obtain supplies. She has bilateral medial malleoli or large wounds which are mostly superficial. Both of them are covered circumferentially with some nonviable fibrinous slough although she tolerates debridement very poorly. She apparently has an appointment for an ablation on the right leg by interventional radiology. 06/14/15; the patient arrives with the wounds and static condition. We attempted a debridement although she does not do well with this secondary to pain. I 07/05/15; wounds are not much smaller however there appears to be a cleaner granulating base. The left has tight fibrinous slough greater than the right. Debridement is tolerated poorly due to pain. Iodoflex is done more for these wounds in any of the multitude of different dressings I have tried on the left 1 and then subsequently the right. 07/12/15; no change in the condition of this  wound. I am able to do an aggressive debridement on the right but not the left. She simply cannot tolerate it. We have been using Iodoflex which helps somewhat. It is worthwhile remembering that at one point we healed the right medial ankle wound and the left was about 25% of the current circumference. We have suggested  returning to vascular surgery for review of possible further ablations for one reason or another she has not been able to do this. 07/26/15 no major change in the condition of either wound on her medial ankle. I did not attempt to debridement of these. She has been aggressively scrubbing these while she is in the shower at home. She has her supply of Iodoflex which seems to have done more for these wounds then anything I have put on recently. 08/09/15 wound area appears larger although not verified by measurements. Using Iodoflex 09/05/2015 -- she was here for avisit today but had significant problems with the wound and I was asked to see her for a physician opinion. I have summarize that this lady has had surgery on her left lower extremity about 10 years ago where the possible veins stripping was done. She has had an opinion from interventional radiology around November 2016 where no further sclerotherapy was ordered. The patient works 12 hours a day and stands on a concrete floor with work boots and is unable to get the proper compression she requires and cannot elevate her limbs appropriately at any given time. She has recently grown Pseudomonas from her wound culture but has not started her ciprofloxacin which was called in for her. 09/13/15 this continues to be a difficult situation for this patient. At one point I had this wound down to a 1.5 x 1.5" wound on her left leg. This is deteriorated and the right leg has reopened. She now has substantial wounds on her medial calcaneus, malleoli and into her lower leg. One on the left has surface eschar but these are far too painful for me to debridement here. She has a vascular surgery appointment next week to see if anything can be done to help here. I think she has had previous ablations several years ago at [facility]. She has no major edema. She tells me that she did not get product last time [facility]] and went for several days without it. She  continues to work in work boots 12 hours a day. She cannot get compression/4-layer under her work boots. 09/20/15 no major change. Periwound edema control was not very good. Her point with pain and vascular is next Wednesday the 25th 09/28/15; the patient is seen vascular surgery and is apparently scheduled for repeat duplex ultrasounds of her bilateral lower legs next week. 10/05/15; the patient was seen by Dr. Doren Custard of vascular surgery. He feels that she should have arterial insufficiency excluded as cause/contributed to her nonhealing stage she is therefore booked for an arteriogram. She has apparently monophasic signals in the dorsalis pedis pulses. She also of course has known severe chronic venous insufficiency with previous procedures as noted previously. I had another long discussion with the patient today about her continuing to work 12 hour shifts. I've written her out for 2 months area had concerns about this as her work location is currently undergoing significant turmoil and this may lead to her termination. She is aware of this however I agree with her that she simply cannot continue to stand for 12 hours multiple days a week with the substantial wound areas  she has. 10/19/15; the Dr. Doren Custard appointment was largely for an arteriogram which was normal. She does not have an arterial issue. He didn't make a comment about her chronic venous insufficiency for which she has had previous ablations. Presumably it was not felt that anything additional could be done. The patient is now out of work as I prescribed 2 weeks ago. Her wounds look somewhat less aggravated presumably because of this. I felt I would give debridement another try today 10/25/15; no major change in this patient's wounds. We are struggling to get her product that she can afford into her own home through her insurance. 11/01/15; no major change in the patient's wounds. I have been using silver alginate as the most affordable product. I  spoke to Dr. Marla Roe last week with her requested take her to the OR for surgical debridement and placement of ACEL. Dr. Marla Roe told me that she would be willing to do this however [facility] will not cover this, fortunately the patient has [place] healthcare of some variant 11/08/15; no major change in the patient's wounds. She has been completely nonviable surface that this but is in too much pain with any attempted debridement are clinic. I have arranged for her to see Dr. Marla Roe ham of plastic surgery and this appointment is on Monday. I am hopeful that they will take her to the OR for debridement, possible ACEL ultimately possible skin graft 11/22/15 no major change in the patient's wounds over her bilateral medial calcaneus medial malleolus into the lower legs. Surface on these does not look too bad however on the left there is surrounding erythema and tenderness. This may be cellulitis or could him sleepy tinea. 11/29/15; no major changes in the patient's wounds over her bilateral medial malleolus. There is no infection here and I don't think any additional antibiotics are necessary. There is now plan to move forward. She sees Dr. Marla Roe in a week's time for preparation for operative debridement and ACEL placement I believe on 7/12. She then has a follow-up appointment with Dr. Marla Roe on 7/21 12/28/15; the patient returns today having been taken to the [place] by Dr. Marla Roe 12/12/15 she underwent debridement, intraoperative cultures [which were negative]. She had placement of a wound VAC. Parent really ACEL was not available to be placed. The wound VAC foam apparently adhered to the wound since then she's been using silver alginate, Xeroform under Ace wraps. She still says there is a lot of drainage and a lot of pain 01/31/16; this is a patient I see monthly. I had referred her to Dr. Marla Roe him of plastic surgery for large wounds on her bilateral medial ankles. She has  been to the OR twice once in early July and once in early August. She tells me over the last 3 weeks she has been using the wound VAC with ACEL underneath it. On the right we've simply been using silver alginate. Under Kerlix Coban wraps. 02/28/16; this is a patient I'm currently seeing monthly. She is gone on to have a skin graft over her large venous insufficiency ulcer on the left medial ankle. This was done by Dr. Marla Roe him. The patient is a bit perturbed about why she didn't have one on her right medial ankle wound. She has been using silver alginate to this. 03/06/16; I received a phone call from her plastic surgery Dr. Marla Roe. She expressed some concern about the viability of the skin graft she did on the left medial ankle wound. Asked  me to place Endoform on this. She told me she is not planning to do a subsequent skin graft on the right as the left one did not take very well. I had placed Hydrofera Blue on the right 03/13/16; continue to have a reasonably healthy wound on the right medial ankle. Down to 3 mm in terms of size. There is epithelialization here. The area on the left medial ankle is her skin graft site. I suppose the last week this looks somewhat better. She has an open area inferiorly however in the center there appears to be some viable tissue. There is a lot of surface callus and eschar that will eventually need to come off however none of this looked to be infected. Patient states that the is able to keep the dressing on for several days which is an improvement. 03/20/16 no major change in the circumference of either wound however on the left side the patient was at Dr. Eusebio Friendly office and they did a debridement of left wound. 50% of the wound seems to be epithelialized. I been using Endoform on the left Hydrofera Blue in the right 03/27/16; she arrives today with her wound is not looking as healthy as they did last week. The area on the right clearly has an  adherent surface to this a very similar surface on the left. Unfortunately for this patient this is all too familiar problem. Clearly the Endoform is not working and will need to change that today that has some potential to help this surface. She does not tolerate debridement in this clinic very well. She is changing the dressing wants 04/03/16; patient arrives with the wounds looking somewhat better especially on the right. Dr. Migdalia Dk change the dressing to silver alginate when she saw her on Monday and also sold her some compression socks. The usefulness of the latter is really not clear and woman with severely draining wounds. 04/10/16; the patient is doing a bit of an experiment wearing the compression stockings that Dr. Migdalia Dk provided her to her left leg and the out of legs based dressings that we provided to the right. 05/01/16; the patient is continuing to wear compression stockings Dr. Migdalia Dk provided her on the left that are apparently silver impregnated. She has been using Iodoflex to the right leg wound. Still a moderate amount of drainage, when she leaves here the wraps only last for 4 days. She has to change the stocking on the left leg every night 05/15/16; she is now using compression stockings bilaterally provided by Dr. Marla Roe. She is wearing a nonadherent layer over the wounds so really I don't think there is anything specific being done to this now. She has some reduction on the left wound. The right is stable. I think all healing here is being done without a specific dressing 06/09/16; patient arrives here today with not much change in the wound certainly in diameter to large circular wounds over the medial aspect of her ankle bilaterally. Under the light of these services are certainly not viable for healing. There is no evidence of surrounding infection. She is wearing compression stockings with some sort of silver impregnation as prescribed by Dr. Marla Roe. She has a  follow-up with her tomorrow. 06/30/16; no major change in the size or condition of her wounds. These are still probably covered with a nonviable surface. She is using only her purchase stockings. She did see Dr. Marla Roe who seemed to want to apply Dakin's solution to this I'm not extreme short what  value this would be. I would suggest Iodoflex which she still has at home. 07/28/16; I follow Mrs. Feuer episodically along with Dr. Marla Roe. 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 She has been applying a topical collagen ointment to both wounds with Adaptic. I don't think Dr. Marla Roe is planning to take her back to the OR. 08/19/16; I follow Mrs. Jeneen Rinks on a monthly basis along with Dr. Marla Roe of plastic surgery. 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 I been following her for a number of years. At one point I was able to get the right medial malleolus wound to heal and had the left medial malleolus down to about half its current size however and I had to send her to plastic surgery for an operative debridement. Since then things have been stable to slightly improve the area on the right is slightly better one in the left about the same although there is much less adherent surface than I'm used to with this patient. She is using some form of liquid collagen gel that Dr. Marla Roe provided a Kerlix cover with the patient's own pressure stockings. She tells me that she has extreme pain in both ankles and along the lateral aspect of both feet. She has been unable to work for some period of time. She is telling me she is retiring at the beginning of April. She sees Dr. Doran Durand of orthopedics next week 09/22/16; patient has not seen Dr. Marla Roe since the last time she is here. I'm not really sure what she is using to the wounds other than bits and pieces of think  she had left over including most recently Hydrofera Blue. She is using juxtalite stockings. She is having difficulty with her husband's recent illness "stroke". She is having to transport him to various doctors appointments. Dr. Marla Roe left her the option of a repeat debridement with ACEL however she has not been able to get the time to follow-up on this. She continues to have a fair amount of drainage out of these wounds with certainly precludes leaving dressings on all week 10/13/16; patient has not seen Dr. Marla Roe since she was last in our clinic. I'm not really sure what she is doing with the wounds, we did try to get her [facility] and I think she is actually using this most of the time. Because of drainage she states she has to change this every second day although this is an improvement from what she used to do. She went to see Dr. Doran Durand who did not think she had a muscular issue with regards to her feet, he referred her to a neurologist and I think the appointment is sometime in June. I changed her back to Iodoflex which she has used in the past but not recently. 11/03/16; the patient has been using Iodoflex although she ran out of this. Still claims that there is a lot of drainage although the wound does not look like this. No surrounding erythema. She has not been back to see Dr. Marla Roe 11/24/16; the patient has been using Iodoflex again but she ran out of it 2 or 3 days ago. There is no major change in the condition of either one of these wounds in fact they are larger and covered in a thick adherent surface slough/nonviable tissue especially on the left. She does not tolerate mechanical debridement in our clinic. Going back to  see Dr. Marla Roe of plastic surgery for an operative debridement would seem reasonable. 12/15/16; the patient has not been back to see Dr. Marla Roe. She is been dealing with a series of illnesses and her husband which of monopolized her time. She is  been using Sorbact which we largely supplied. She states the drainage is bad enough that it maximum she can go 2-3 days without changing the dressing 01/12/2017 -- the patient has not been back for about 4 weeks and has not seen Dr. Marla Roe not does she have any appointment pending. 01/23/17; patient has not seen Dr. Marla Roe even though I suggested this previously. She is using Santyl that was suggested last week by Dr. Con Memos this Cost her $16 through her insurance which is indeed surprising 02/12/17; continuing Santyl and the patient is changing this daily. A lot of drainage. She has not been back to see plastic surgery she is using an Ace wrap. Our intake nurse suggested wrap around stockings which would make a good reasonable alternative 02/26/17; patient is been using Santyl and changing this daily due to drainage. She has not been to see plastic surgery she uses in April Ace wrap to control the edema. She did obtain extremitease stockings but stated that the edema in her leg was to big for these 03/20/17; patient is using Santyl and Anasept. Surfaces looked better today the area on the right is actually measuring a little smaller. She has states she has a lot of pain in her feet and ankles and is asking for a consult to pain control which I'll try to help her with through our case manager. 04/10/17; the patient arrives with better-looking wound surfaces and is slightly smaller wound on the left she is using a combination of Santyl and Anasept. She has an appointment or at least as started in the pain control center associated with [facility] 05/14/17; this is a patient who I followed for a prolonged period of time. She has venous insufficiency ulcers on her bilateral medial ankles. At one point I had this down to a much smaller wound on the left however these reopened and we've never been able to get these to heal. She has been using Santyl and Anasept gel although 2 weeks ago she ran  out of the Anasept gel. She has a stable appearance of the wound. She is going to the wound care clinic at [facility]. They wanted do a nerve block/spinal block although she tells me she is reluctant to go forward with that. 05/21/17; this is a patient I have followed for many years. She has venous insufficiency ulcers on her bilateral medial ankles. Chronic pain and deformity in her ankles as well. She is been to see plastic surgery as well as orthopedics. Using PolyMem AG most recently/Kerramax/ABDs and 2 layer compression. She has managed to keep this on and she is coming in for a nurse check to change the dressing on Tuesdays, we see her on Fridays 06/05/17; really quite a good looking surface and the area especially on the right medial has contracted in terms of dimensions. Well granulated healthy-looking tissue on both sides. Even with an open curet there is nothing that even feels abnormal here. This is as good as I've seen this in quite some time. We have been using PolyMem AG and bringing her in for a nurse check 06/12/17; really quite good surface on both of these wounds. The right medial has contracted a bit left is not. We've been using PolyMem  and AG and she is coming in for a nurse visit 06/19/17; we have been using PolyMem AG and bringing her in for a nurse check. Dimensions of her wounds are not better but the surfaces looked better bilaterally. She complained of bleeding last night and the left wound and increasing pain bilaterally. She states her wound pain is more neuropathic than just the wounds. There was some suggestion that this was radicular from her pain management doctor in talking to her it is really difficult to sort this out. 06/26/17; using PolyMem and AG and bringing her in for a nurse check as All of this and reasonably stable condition. Certainly not improved. The dimensions on the lateral part of the right leg look better but not really measuring better. The medial  aspect on the left is about the same. 07/03/16; we have been using PolyMen AG and bringing her in for a nurse check to change the dressings as the wounds have drainage which precludes once weekly changing. We are using all secondary absorptive dressings.our intake nurse is brought up the idea of using a wound VAC/snap VAC on the wound to help with the drainage to see if this would result in some contraction. This is not a bad idea. The area on the right medial is actually looking smaller. Both wounds have a reasonable-looking surface. There is no evidence of cellulitis. The edema is well controlled 07/10/17; the patient was denied for a snap VAC by her insurance. The major issue with these wounds continues to be drainage. We are using wicked PolyMem AG and she is coming in for a nurse visit to change this. The wounds are stable to slightly improved. The surface looks vibrant and the area on the right certainly has shrunk in size but very slowly 07/17/17; the patient still has large wounds on her bilateral medial malleoli. Surface of both of these wounds looks better. The dimensions seem to come and go but no consistent improvement. There is no epithelialization. We do not have options for advanced treatment products due to insurance issues. They did not approve of the wound VAC to help control the drainage. More recently we've been using PolyMem and AG wicked to allow drainage through. We have been bringing her in for a nurse visit to change this. We do not have a lot of options for wound care products and the home again due to insurance issues 07/24/17; the patient's wound actually looks somewhat better today. No drainage measurements are smaller still healthy-looking surface. We used silver collagen under PolyMen started last week. We have been bringing her in for a dressing change 07/31/17; patient's wound surface continued to look better and I think there is visible change in the dimensions of the wound on  the right. Rims of epithelialization. We have been using silver collagen under PolyMen and bringing her in for a dressing change. There appears to be less drainage although she is still in need of the dressing change 08/07/17. Patient's wound surface continues to look better on both sides and the area on the right is definitely smaller. We have been using silver collagen and PolyMen. She feels that the drainage has been it has been better. I asked her about her vascular status. She went to see Dr. Aleda Grana at [facility] and had some form of ablation. I don't have much detail on this. I haven't my notes from 2016 that she was not a candidate for any further ablation but I don't have any more information  on this. We had referred her to vein and vascular I don't think she ever went. He does not have a history of PAD although I don't have any information on this either. We don't even have ABIs in our record 08/14/17; we've been using silver collagen and PolyMen cover. And putting the patient and compression. She we are bringing her in as a nurse visit to change this because ofarge amount of drainage. We didn't the ABIs in clinic today since they had been done in many moons 1.2 bilaterally. She has been to see vein and vascular however this was at [facility] and she had ablation although I really don't have any information on this all seemed biking get a report. She is also been operatively debrided by plastic surgery and had a cell placed probably 8-12 months ago. This didn't have a major effect. We've been making some gains with current dressings 08/19/17-She is here in follow-up evaluation for bilateral medial malleoli ulcers. She continues to tolerate debridement very poorly. We will continue with recently changed topical treatment; if no significant improvement may consider switching to Iodosorb/Iodoflex. 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  She has been using silver collagen and PolyMem. I believe she has been used and tried on Iodoflex before. 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 These have been very refractory over time. We have been using silver collagen and PolyMen. She is coming in once a week for a doctor's and once a week for nurse visits. We are actually making some progress 09/18/17; the patient's wounds are smaller especially on the right medial. She arrives today to upset to consider even washing these off with Anasept which I think is been part of the reason this is been closing. We've been using collagen covered in PolyMen otherwise. It is noted that she has a small area of folliculitis on the right medial calf that. As we are wrapping her legs I'll give her a short course of doxycycline to make sure this doesn't amount to anything. She is a long list of complaints today including imbalance, shortness of breath on exertion, inversion of her left ankle. With regards to the latter complaints she is been to see orthopedics and they offered her a tendon release surgery I believe but wanted her wounds to be closed first. I have recommended she go see her primary physician with regards to everything else. 09/25/17; patient's wounds are about the same size. We have made some progress bilaterally although not in recent weeks. She will not allow me T wash these wounds with Anasept even if she is doing her cell. Wheeze we've been using collagen covered in PolyMen. Last week she had a small area of folliculitis this is now opened into a small wound. She completed 5 days of trimethoprim sulfamethoxazole 10/02/17; unfortunately the area on her left medial ankle is worse with a larger wound area towards the Achilles. The patient complains of a lot  of pain. She will not allow debridement although visually I don't think there is anything to debridement in any case. We have been using silver collagen and PolyMen for several months now. Initially we are making some progress although I'm not really seeing that today. We will move back to [facility]. His admittedly this is  a bit of a repeat however I'm hoping that his situation is different now. The patient tells me she had her leg on the left give out on her yesterday this is process some pain. 10/09/17; the patient is seen twice a week largely because of drainage issues coming out of the chronic medial bimalleolar wounds that are chronic. Last week the dimensions of the one on the left looks a little larger I changed her to [facility]. She comes in today with a history of terrible pain in the bilateral wound areas. She will not allow debridement. She will not even allow a tissue culture. There is no surrounding erythema no no evidence of cellulitis. We have been putting her Kerlix Coban man. She will not allow more aggressive compression as there was a suggestion to put her in 3 layer wraps. 10/16/17; large wounds on her bilateral medial malleoli. These are chronic. Not much change from last week. The surface looks have healthy but absolutely no epithelialization. A lot of pain little less so of drainage. She will not allow debridement or even washing these off in the vigorous fashion with Anasept. 10/23/17; large wounds on her bilateral malleoli which are chronic. Some improvement in terms of size perhaps on the right since last time I saw these. She states that after we increased the 3 layer compression there was some bleeding, when she came in for a nurse visit she did not want 3 layer compression put back on about our nurse managed to convince her. She has known chronic venous visit issues and I'm hoping to get her to tolerate the 3 layer compression. using Hydrofera Blue 10/30/17;  absolutely no change in the condition of either wound although we've had some improvement in dimensions on the right.. Attempted to put her in 3 layer compression she didn't tolerated she is back in 2 layer compression. We've been using Hydrofera Blue We looked over her past records. She had venous reflux studies in November 2016. There was no evidence of deep venous reflux on the right. Superficial vein did not show the greater saphenous vein at think this is been previously ablated the small saphenous vein was within normal limits. The left deep venous system showed no DVT the vessels were positive for deep venous reflux in the posterior tibial veins at the ankle. The greater saphenous vein was surgically absent small saphenous vein was within normal limits. She went to vein and vascular at [facility]. I believe she had an ablation on the left greater saphenous vein. I'll update her reflux studies perhaps ever reviewed by vein and vascular. We've made absolutely no progress in these wounds. Will also try to read and TheraSkins through her insurance 11/06/17; W the patient apparently has a 2 week follow-up with vein and vascular I like him to review the whole issue with regards to her previous vascular workup by Dr. Aleda Grana. We've really made no progress on these wounds in many months. She arrives today with less viable looking surface on the left medial ankle wound. This was apparently looking about the same on Tuesday when she was here for nurse visit. 11/13/17; deep tissue culture I did last time of the left lower leg showed multiple organisms without any predominating. In particular no Staphylococcus or group A strep were isolated. We sent her for venous reflux studies. She's had a previous left greater saphenous vein stripping and I think sclerotherapy of the right greater saphenous vein. She didn't really look at the lesser saphenous  vein this both wounds are on the medial aspect. She has  reflux in the common femoral vein and popliteal vein and an accessory vein on the right and the common femoral vein and popliteal vein on the left. I'm going to have her go to see vein and vascular just the look over things and see if anything else beside aggressive compression is indicated here. We have not been able to make any progress on these wounds in spite of the fact that the surface of the wounds is never look too bad. 11/20/17; no major change in the condition of the wounds. Patient reports a large amount of drainage. She has a lot of complaints of pain although enlisting her today I wonder if some of this at least his neuropathic rather than secondary to her wounds. She has an appointment with vein and vascular on 12/30/17. The refractory nature of these wounds in my mind at least need vein and vascular to look over the wounds the recent reflux studies we did and her history to see if anything further can be done here. I also note her gait is deteriorated quite a bit. 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 She has a bilateral Trendelenburg gait. I wonder if this is neuropathic or perhaps multilevel radicular. 11/27/17; her wounds actually looks slightly better. Healthy-looking granulation tissue a scant amount of epithelialization. [place] [facility] will not pay for [employer]. They will play for tri layer Oasis and Dermagraft. This is not a diabetic ulcer. We'll try for the tri layer Oasis. She still complains of some drainage. She has a vein and vascular appointment on 12/30/17 12/04/17; the wounds visually look quite good. Healthy-looking granulation with some degree of epithelialization. We are still waiting for response to our request for trial to try layer Oasis. Her appointment with vascular to review venous and arterial issues isn't sold the end of July 7/31. Not allow debridement or even vigorous cleansing of the wound surface. 12/18/17; slightly smaller especially on the  right. 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 We've been using Hydrofera Blue. We still are looking into triple layer Oasis through her insurance 01/08/18 on evaluation today patient's wound actually appears to be showing signs of good improvement at this point in time. She has been tolerating the dressing changes without complication. Fortunately there does not appear to be any evidence of infection at this point in time. We have been utilizing silver nitrate which does seem to be of benefit for her which is also good news. Overall I'm very happy with how things seem to be both regards appearance as well as measurement. Patient did see Dr. Bridgett Larsson for evaluation on 12/30/17. In his assessment he felt that stripping would not likely add much more than chronic compression to the patient's healing process. His recommendation was to follow-up in three months with Dr. Doren Custard if she hasn't healed in order to consider referral back to you and see vascular where she previously was in a trial and was able to get her wound to heal. I'll be see what she feels she when you staying compression and he reiterated this as well. 01/13/18 on evaluation today patient appears to actually be doing very well in regard to her bilateral medial malleolus ulcers. She seems to have tolerated the chemical cauterization with silver nitrate last week she did have some pain through that evening but fortunately states that I'll be see since it seems to be doing better she is  overall pleased with the progress. 01/21/18; really quite a remarkable improvement since I've last seen these wounds. We started using silver nitrate specially on the islands of hyper granulation which for some reason her around the wound circumference. This is really done quite nicely. Primary dressing Hydrofera Blue under 4 layer compression. She seems to be able to hold out without a nurse rewrap. Follow-up in 1 week 01/28/18; we've  continued the hydrofera blue but continued with chemical cauterization to the wound area that we started about a month ago for irregular hyper granulation. She is made almost stunning improvement in the overall wound dimensions. I was not really expecting this degree of improvement in these chronic wounds 02/05/18; we continue with Hydrofera Bluebut of also continued the aggressive chemical cauterization with silver nitrate. We made nice progress with the right greater than left wound. 02/12/18. We continued with Hydrofera Blue after aggressive chemical cauterization with silver nitrate. We appear to be making nice progress with both wound areas 02/19/2018; we continue with [facility] after washing the wounds vigorously with Anasept spray and chemical cauterization with silver nitrate. We are making excellent progress. The area on the right's just about closed 02/26/2018. The area on the left medial ankle had too much necrotic debris today. I used a #5 curette we are able to get most of the soft. I continued with the silver nitrate to the much smaller wound on the right medial ankle she had a new area on her right lower pretibial area which she says was due to a role in her compression 03/05/2018; both wound areas look healthy. Not much change in dimensions from last week. I continue to use silver nitrate and Hydrofera Blue. The patient saw Dr. Doren Custard of vein and vascular. He felt she had venous stasis ulcers. He felt based on her previous arteriogram she should have adequate circulation for healing. Also she has deep venous reflux but really no significant correctable superficial venous reflux at this time. He felt we should continue with conservative management including leg elevation and compression 04/02/2018; since we last saw this woman about a month ago she had a fall apparently suffered a pelvic fracture. I did not look up the x-ray. Nevertheless because of pain she literally was bedbound for 2  weeks and had home health coming out to change the dressing. Somewhat predictably this is resulted in considerable improvement in both wound areas. The right is just about closed on the medial malleolus and the left is about half the size. 04/16/2018; both her wounds continue to go down in size. Using Hydrofera Blue. 05/07/18; both her wounds appeared to be improving especially on the right where it is almost closed. We are using Hydrofera Blue 05/14/2018; slightly worse this week with larger wounds. Surface on the left medial not quite as good. We have been using Hydrofera Blue 05/21/18; again the wounds are slightly larger. Left medial malleolus slightly larger with eschar around the circumference. We have been using Hydrofera Blue undergoing a wraps for a prolonged period of time. This got a lot better when she was more recumbent due to a fall and a back injury. I change the primary dressing the silver alginate today. She did not tolerate a 4 layer compression previously although I may need to bring this up with her next time 05/28/2018; area on the left medial malleolus again is slightly larger with more drainage. Area on the right is roughly unchanged. She has a small area of folliculitis on the right  medial just on the lower calf. This does not look ominous. 06/03/2018 left medial malleolus slightly smaller in a better looking surface. We used silver nitrate on this last time with silver alginate. The area on the right appears slightly smaller 1/10; left medial malleolus slightly smaller. Small open area on the right. We used silver nitrate and silver alginate as of 2 weeks ago. We continue with the wound and compression. These got a lot better when she was off her feet 1/17; right medial malleolus wound is smaller. The left may be slightly smaller. Both surfaces look somewhat better. 1/24; both wounds are slightly smaller. Using silver alginate under Unna boots 1/31; both wounds appear smaller in  fact the area on the right medial is just about closed. Surface eschar. We have been using silver alginate under Unna boots. The patient is less active now spends let much less time on her feet and I think this is contributed to the general improvement in the wound condition 2/7; both wounds appear smaller. I was hopeful the right medial would be closed however there there is still the same small open area. Slight amount of surface eschar on the left the dimensions are smaller there is eschar but the wound edges appear to be free. We have been using silver alginate under Unna boot's 2/14; both wounds once again measure smaller. Circumferential eschar on the left medial. We have been using silver alginate under Unna boots with gradual improvement 2/21; the area on the right medial malleolus has healed. The area on the left is smaller. We have been using silver alginate and Unna boots. We can discharge wrapping the right leg she has 20/30 stockings at home she will need to protect the scar tissue in this area 2/28; the area on the right medial malleolus remains closed the patient has a compression stocking. The area on the left is smaller. We have been using silver alginate and Unna boots. 3/6 the area on the right medial ankle remains closed. Good edema control noted she is using her own compression stocking. The area on the left medial ankle is smaller. We have been managing this with silver alginate and Unna boots which we will continue today. 3/13; the area on the right medial ankle remains closed and I'm declaring it healed today. When necessary the left is about the same still a healthy-looking surface but no major change and wound area. No evidence of infection and using silver alginate under unna and generally making considerable improvement 3/27 the area on the right medial ankle remains closed the area on the left is about the same as last week. Certainly not any worse we have been using  silver alginate under an Unna boot 4/3; the area on the right medial ankle remains closed per the patient. We did not look at this wound. The wound on the left medial ankle is about the same surface looks healthy we have been using silver alginate under an Unna boot 4/10; area on the right medial ankle remains closed per the patient. We did not look at this wound. The wound on the left medial ankle is slightly larger. The patient complains that the [facility] caused burning pain all week. She also told us that she was a lot more active this week. Changed her back to silver alginate 4/17; right medial ankle still closed per the patient. Left medial ankle is slightly larger. Using silver alginate. She did not tolerate Hydrofera Blue on this area 4/24; right medial  ankle remains closed we have not look at this. The left medial ankle continues to get larger today by about a centimeter. We have been using silver alginate under Unna boots. She complains about 4 layer compression as an alternative. She has been up on her feet working on her garden 5/8; right medial ankle remains closed we did not look at this. The left medial ankle has increased in size about 100%. We have been using silver alginate under Unna boots. She noted increased pain this week and was not surprised that the wound is deteriorated 5/15; no major change in SA however much less erythema ( one week of doxy ocellulitis). 5/22-65 year old female returns at 1 week to the clinic for left medial ankle wound for which we have been using silver alginate under 3 layer compression She was placed on DOXY at last visit - the wound is wider at this visit. She is in 3 layer compression 5/29; change to [facility] last week. I had given her empiric doxycycline 2 weeks ago for a week. She is in 3 layer compression. She complains of a lot of pain and drainage on presentation today. 6/5; using Hydrofera Blue. I gave her doxycycline recently  empirically for erythema and pain around the wound. Believe her cultures showed enterococcus which not would not have been well covered by doxycycline nevertheless the wound looks better and I don't feel specifically that the enterococcus needs to be covered. She has a new what looks like a wrap injury on her lateral left ankle. 6/12; she is using Hydrofera Blue. She has a new area on the left anterior lower tibial area. This was a wrap injury last week. 6/19; the patient is using Hydrofera Blue. She arrived with marked inflammation and erythema around the wound and tenderness. 12/01/18 on evaluation today patient appears to be doing a little bit better based on what I'm hearing from the standpoint of lassos evaluation to this as far as the overall appearance of the wound is concerned. Then sometime substandard she typically sees Dr. Dellia Nims. Nonetheless overall very pleased with the progress that she's made up to this point. No fevers, chills, nausea, or vomiting noted at this time. 7/10; some improvement in the surface area. Aggressively debrided last week apparently. I went ahead with the debridement today although the patient does not tolerate this very well. We have been using Iodoflex. Still a fair amount of drainage 7/17; slightly smaller. Using Iodoflex. 7/24; no change from last week in terms of surface area. We have been using Iodoflex. Surface looks and continues to look somewhat better 7/31; surface area slightly smaller better looking surface. We have been using Iodoflex. This is under Unna boot compression 8/7-Patient presents at 1 week with Unna boot and Iodoflex, wound appears better 8/14-Patient presents at 1 week with Iodoflex, we use the Unna boot, wound appears to be stable better.Patient is getting Botox treatment for the inversion of the foot for tendon release, Next week 8/21; we are using Iodoflex. Unna boot. The wound is stable in terms of surface area. Under illumination there  is some areas of the wound that appear to be either epithelialized or perhaps this is adherent slough at this point I was not really clear. It did not wipe off and I was reluctant to debride this today. 8/28; we are using Iodoflex in an Unna boot. Seems to be making good improvement. 9/4; using Iodoflex and wound is slightly smaller. 9/18; we are using Iodoflex with topical silver nitrate  when she is here. The wound continues to be smaller 10/2; patient missed her appointment last week due to GI issues. She left and Iodoflex based dressing on for 2 weeks. Wound is about the same size about the size of a dime on the left medial lower 10/9 we have been using Iodoflex on the medial left ankle wound. She has a new superficial probable wrap injury on the dorsal left ankle 10/16; we have been using Hydrofera Blue since last week. This is on the left medial ankle 10/23; we have been using Hydrofera Blue since 2 weeks ago. This is on the left medial ankle. Dimensions are better 11/6; using Hydrofera Blue. I think the wound is smaller but still not closed. Left medial ankle 11/13; we have been using Hydrofera Blue. Wound is certainly no smaller this week. Also the surface not as good. This is the remanent of a very large area on her left medial ankle. 11/20; using Sorbact since last week. Wound was about the same in terms of size although I was disappointed about the surface debris 12/11; 3-week follow-up. Patient was on vacation. Wound is measuring slightly larger we have been using Sorbact. 12/18; wound is about the same size however surface looks better last week after debridement. We have been using Sorbact under compression 1/15 wound is probably twice the size of last time increased in length nonviable surface. We have been using Sorbact. She was running a mild fever and missed her appointment last week 1/22; the wound is come down in size but under illumination still a very adherent debris we have  been Hydrofera Blue that I changed her to last week 1/29; dimensions down slightly. We have been using Hydrofera Blue 2/19 dimensions are the same however there is rims of epithelialization under illumination. Therefore more the surface area may be epithelialized 2/26; the patient's wound actually measures smaller. The wound looks healthy. We have been using Hydrofera Blue. I had some thoughts about running Apligraf then I still may do that however this looks so much better this week we will delay that for now 3/5; the wound is small but about the same as last week. We have been using Hydrofera Blue. No debridement is required today. 3/19; the wound is about the size of a dime. Healthy looking wound even under illumination. We have been using Hydrofera Blue. No mechanical debridement is necessary 3/26; not much change from last week although still looks very healthy. We have been using Hydrofera Blue under Unna boots Patient was offered an ankle fusion by podiatry but not until the wound heals with a proceed with this. 4/9; the patient comes in today with her original wound on the medial ankle looking satisfactory however she has some uncontrolled swelling in the middle part of her leg with 2 new open areas superiorly just lateral to the tibia. I think this was probably a wrap issue. She said she felt uncomfortable during the week but did not call in. We have been using Hydrofera Blue 4/16; the wound on the medial ankle is about the same. She has innumerable small areas superior to this across her mid tibia. I think this is probably folliculitis. She is also been working in the yard doing a lot of sweating 4/30; the patient issue on the upper areas across her mid tibia of all healed. I think this was excessive yard work if I remember. Her wound on the medial ankle is smaller. Some debris on this we have been using  Hydrofera Blue under Unna boots 5/7; mid tibia. She has been using Hydrofera Blue  under an Unna wrap. She is apparently going for her ankle surgery on June 3 10/28/19-Patient returns to clinic with the ankle wound, we are using Hydrofera Blue under Unna wrap, surgery is scheduled for her left foot for June 3 so she will be back for nurse visit next week READMISSION 01/17/2020 Mrs. Meine is a 65 year old woman we have had in this clinic for a long period of time with severe venous hypertension and refractory wounds on her medial lower legs and ankles bilaterally. 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 When she was off her legs for a prolonged period example when she fell and suffered a compression fracture things would heal up quite nicely. She is now retired and we managed to heal up the right medial leg wound. The left one was very tiny last time I saw this although still refractory. 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 It got to the point where this was interfering with her walking and she elected to proceed with a ankle arthrodesis to straighten her her ankle and leave her with a functional outcome for mobilization. The patient was referred to Dr. Doren Custard and really this took some time to arrange. Dr. Doren Custard saw her on 12/07/2019. Once again he verified that she had no arterial issues. She had previously had an angiogram several years ago. Follow-up ABIs on the left showed an ABI of 1.12 with triphasic waveforms and a TBI of 0.92. She is felt to have chronic deep venous insufficiency but I do not think it was felt that anything could be done from about this from an ablation point of view. At the time Dr. Doren Custard saw this patient the wounds actually look closed via the pictures in his clinic. The patient finally underwent her surgery on 12/15/2019. This went reasonably  well and there was a good anatomic outcome. She developed a small distal wound dehiscence on the lateral part of the surgical wound. However more problematically she is developed recurrence of the wound on the medial left ankle. There are actually 2 wounds here one in the distal lower leg and 1 pretty much at the level of the medial malleolus. It is a more distal area that is more problematic. She has been using Hydrofera Blue which started on Friday before this she was simply Ace wrapping. There was a culture done that showed Pseudomonas and she is on ciprofloxacin. A recent CNS on 8/11 was negative. The patient reports some pain but I generally think this is improving. She is using a cam boot completely nonweightbearing using a walker for pivot transfers and a wheelchair 8/24; not much improvement unfortunately she has a surgical wound on the lateral part in the venous insufficiency wound medially. The bottom part of the medial insufficiency wound is still necrotic there is exposed tendon here. We have been using Hydrofera Blue under compression. Her edema control is however better 8/31; patient in for follow-up of his surgical wound on the lateral part of her left leg and chronic venous insufficiency ulcers medially. We put her back in compression last week. She comes in today with a complaint of 3 or 4 days [name] of increasing pain. She felt her cam  walker was rubbing on the area on the back of her heel. However there is intense erythema seems more likely she has cellulitis. She had 2 cultures done when she was seeing podiatry in the postop. One of them in late July showed Pseudomonas and she received a course of ciprofloxacin the other was negative on 8/11 she is allergic to penicillin with anaphylactoid complaints of hives oral swelling via information in epic 9/9; when I saw this patient last week she had intense anterior erythema around her wound on the right lateral heel and ankle and also  into the right medial heel. Some of this was no doubt drainage and her walker boot however I was convinced she had cellulitis. I gave her Levaquin and Bactrim she is finishing up on this now. She is following up with Dr. [name] he saw her yesterday. He is taken her out of the walking boot of course she is still nonweightbearing. Her x-ray was negative for any worrisome features such as soft tissue air etc. Things are a lot better this week. She has home health. We have been using Hydrofera Blue under an [employer] which she put back on yesterday. I did not wrap her last week 9/17; her surrounding skin looks a lot better. In fact the area on the left lateral ankle has just a scant amount of eschar. The only remaining wound is the large area on the left medial ankle. Probably about 60% of this is healthy granulation at the surface however she has a significant divot distally. This has adherent debris in it. I been using debridement and silver collagen to try and get this area to fill-in although I do not think we have made much progress this week 9/24; the patient's wound on the left medial ankle looks a lot better. The deeper divot area distally still requires debridement but this is cleaning up quite nicely we have been using silver collagen. The patient is complaining of swelling in her foot and is worried that that is contributing to the nonhealing of the ankle wound. She is also complaining of numbness in her anterior toes 10/4; left medial ankle. The small area distally still has a divot with necrotic material that I have been debriding away. This has an undermining area. She is approved for Apligraf. She saw Dr. [name] her surgeon on 10/1. I think he declared himself is satisfied with the condition of things. Still nonweightbearing till the end of the month. 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 Her surgical wound is well closed. There is no evidence of  infection 10/11; the patient arrived in clinic today with the expectation that we be able to put an Apligraf on this area after debridement however she arrives with a relatively large amount of green drainage on the dressing. The patient states that this started on Friday. She has not been systemically unwell. 10/19; culture I did last week showed both Enterococcus and Pseudomonas. I think this came in separate parts because I stopped her ciprofloxacin I gave her and prescribed her linezolid however now looking at the final culture result this is Pseudomonas which is resistant to quinolones. She has not yet picked up the linezolid apparently phone issues. We are also trying to get a topical antibiotic out of [facility] in [place] they can be applied by home health. She is still having green drainage 10/16; the patient has her topical antibiotic from [facility] [place] in [place]. This is a compounded  gel with vancomycin and ciprofloxacin and gentamicin. We are applying this on the wound bed with silver alginate over the top with Unna boot wraps. She arrives in clinic today with a lot less ominous looking drainage although she is only use this topical preparation once the second time today. She sees Dr. [name] her surgeon on Friday she has home health changing the dressing 11/2; still using her compounded topical antibiotic under silver alginate. Surface is cleaning up there is less drainage. We had an Apligraf for her today and I elected to apply it. A light coating of her antibiotic 04/25/2020 upon evaluation today patient appears to be doing well with regard to her ankle ulcer. There is a little bit of slough noted on the surface of the wound I am can have to perform sharp debridement to clear this away today. With that being said other than that fact overall I feel like she is making progress and we do see some new epithelial growth. There is also some improvement in the depth of the wound and  that distal portion. There is little bit of slough there as well. 12/7; 2-week follow-up. Apligraf #3. Dimensions are smaller. Closing in especially inferiorly. Still some surface debris. Still using the [facility] topical antibiotic but I told her that I don't think this needs to be renewed 12/21; 2-week follow-up. Apligraf #4 dimensions are smaller. Nice improvement 06/05/2020; 2-week follow-up. The patient's wound on the left medial ankle looks really excellent. Nice granulation. Advancing epithelialization no undermining no evidence of infection. We would have to reapply for another Apligraf but with the condition of this wound I did not feel strongly about it. We used Hydrofera Blue under the same degree of compression. She follows up with Dr. [name] her surgeon a week Friday 06/13/2020 upon evaluation today patient appears to be doing excellent in regard to her wound. She has been tolerating the dressing changes without complication. Fortunately there is no signs of active infection at this time. No fevers, chills, nausea, vomiting, or diarrhea. She was using Hydrofera Blue last week. 06/20/2020 06/20/2020 on evaluation today patient actually appears to be doing excellent in regard to her wound. This is measuring better and looking much better as well. She has been using the collagen that seems to be doing better for her as well even though the [facility] was and is not sticking or feeling as rough on her wound. She did see Dr. [name] on Friday he is very pleased he also stated none of the hardware has shifted. That is great news 1/27; the patient has a small clean wound all that is remaining. I agree that this is too small to really consider an Apligraf. Under illumination the surface is looking quite good. We have been using collagen although the dimensions are not any better this week 2/2; the patient has a small clean wound on the left medial ankle. Although this left of her substantial original  areas. Measurements are smaller. We have been using polymen Ag under an Haematologist. 2/10; small area on the left medial ankle. This looks clean nothing to debride however dimensions are about the same we have been using polymen I think now for 2 weeks 2/17; not much change in surface area. We have been using polymen Ag without any improvement. 3/17; 1 month follow-up. The patient has been using endoform without any improvement in fact I think this looks worse with more depth and more expansion 3/24; no improvement. Perhaps less debris on  the surface. We have been using Sorbact for 1 week 4/4; wound measures larger. She has edema in her leg and her foot which she tells as her wrap came down. We have been using Unna boots. Sorbact of the wound. She has been approved for Apligraf 09/12/2020 upon evaluation today patient appears to be doing well with regard to her wound. We did get the Apligraf reapproved which is great news we have that available for application today. Fortunately there is no signs of infection and overall the patient seems to be doing great. The wound bed is nice and clean. 4/27; patient presents for her second application of Apligraf. She states over the past week she has been on her feet more often due to being outside in her garden. She has noted more swelling to her foot as a result. She denies increased warmth, pain or erythema to the wound site. 10/10/2020 upon evaluation today patient appears to be doing well with regard to her wound which does not appear to be quite as irritated as last week from what I am hearing. With that being said unfortunately she is having issues with some erythema and warmth to touch as well as an increase size. I do believe this likely is infected. 10/17/2020 upon evaluation today patient appears to be doing excellent in regard to her wound this is significantly improved as compared to last week. Fortunately I think that the infection is much better  controlled at this point. She did have evidence of both Enterococcus as well as Staphylococcus noted on culture. Enterococcus really would not be helped significantly by the Cipro but the wound is doing so much better I am under the assumption that the Staphylococcus is probably the main organism that is causing the current infection. Nonetheless I think that she is doing excellent as far as that is concerned and I am very pleased in that regard. I would therefore recommend she continue with the Cipro. 10/31/2020 upon evaluation today patient appears to be doing well with regard to her wound. She has been tolerating the dressing changes without complication. Fortunately there is no signs of active infection and overall I am extremely pleased with where things stand today. No fevers, chills, nausea, vomiting, or diarrhea. With that being said she does have some green drainage coming from the wound and although it looks okay I am a little concerned about the possibility of a continuing infection. Specifically with Pseudomonas. For that reason I will go ahead and send in a prescription for Cipro for her to be continued. 11/14/2020 upon evaluation today patient appears to be doing very well currently in regard to her wound on her leg. She has been tolerating the dressing changes without complication. Fortunately I feel like the infection is finally under good control here. Unfortunately we do not have the Apligraf for application today although we can definitely order to have it in place for next week. That will be her fifth and final of the current series. Nonetheless I feel like her wound is really doing quite well which is great news. 11/21/2020 upon evaluation today patient appears to be doing well with regard to her wound on the medial ankle. Fortunately I think the infection is under control and I do believe we can go ahead and reapply the Apligraf today. She is in agreement with that plan. There does not  appear to be any signs of active infection at this time which is great news. No fevers, chills, nausea, vomiting, or  diarrhea. 12/05/2020 upon evaluation today patient's wound bed actually showed signs of good granulation epithelization at this point. There does not appear to be any signs of infection which is great news and overall very pleased with where things stand. Overall the patient seems to be doing fairly well in my opinion with regard to her wound although I do believe she continues to build up a lot of biofilm I think she could benefit from using PuraPly at this point. 12/12/2020 upon evaluation today patient's wound actually appears to be doing decently well today. The Unna boot has not been quite as well-tolerated so that more uncomfortable for her and even causing some pressure over the plate on the lateral portion of her foot which is 90 where the wound is. There did not appear to be any significant deep tissue injury with that there may be a minimal change in the skin noted I think that we may want to go back to the Coflex 2 layer which is a little bit easier on her skin it seems. 12/19/2020 upon evaluation today patient actually seems to be making great progress with the PuraPly currently. She in fact seems to be much better as far as the overall appearance of the wound bed is concerned I am very happy in this regard. I do not see any signs of of infection which is great news as well. No fevers, chills, nausea, vomiting, or diarrhea. 12/26/2020 upon evaluation today patient appears to actually be doing better in regard to her wound on the left medial ankle region. The surface of the wound is actually doing significantly better which is great news. There does not appear to be any signs of infection which is also great news and in general I am extremely pleased with where we stand today. 01/02/2021 upon evaluation today patient appears to be doing well with regard to her wound. In fact this is  showing signs of excellent improvement and very pleased with where things stand. In fact the last 3 appointments have all shown signs of this getting smaller which is excellent news. I have not even had to perform any debridement and today is no exception. Overall I feel like this is dramatically improved compared to previous. T oday is PuraPly application #4. [id number] upon evaluation today patient appears to be doing excellent in regard to her wound this is continue to show signs of improvement and overall I am extremely pleased with where we stand today. She is actually here for PuraPly application #5. Every time we have applied this she is noted definite improvement on measurements. 01/23/2021 upon evaluation today patient is actually making good progress in regard to her wound. This was actually on just a little bit longer this time compared to previous due to the fact that she did have to go out of town. She is actually here for PuraPly application #6. We have definitely been seeing improvements in the overall quality of the tissue on the surface of the wound which is awesome news. In general I think that the patient seems to be continuing to make great progress here. 01/30/2021 upon evaluation today patient's wound is actually doing excellent. There is really not any significant biofilm buildup which is great news and overall I am extremely pleased with where things stand today. There does not appear to be any signs of active infection. No fevers, chills, nausea, vomiting, or diarrhea. 02/06/2021 upon evaluation today patient's wound is actually showing signs of excellent improvement which is great  news. We continue to see the benefit of the PuraPly this is doing a great job the wound seems not really irritated whatsoever and is showing signs of good granulation at this point. Overall I am extremely happy with what we are seeing. The patient likewise is happy to hear all of this as well. 02/13/2021  upon evaluation today patient appears to be doing well with regard to her wound. This again is measuring smaller today and I am very pleased in this regard. Fortunately there does not appear to be any signs of active infection at this time which is good news from a systemic standpoint. Locally there is still some significant drainage which she does have is concerned about infection locally. No fevers, chills, nausea, vomiting, or diarrhea. 02/20/2021 upon evaluation today patient actually appears to be doing decently well in regard to her wound. She has been tolerating the dressing changes without complication. I do believe the PuraPly is helping wound bed does appear to be doing better. There is no evidence of active infection locally or systemically at this point visually although on fluorescence imaging there still appear to be bacterial load and bioburden noted. 02/27/2021 upon evaluation today patient fortunately continues to show signs of improvement with use of the PuraPly currently. Subsequently we did review her culture results and to be honest I think that the Bactrim is probably the best option to have her continue at this point. For that reason I am get a go ahead and send in a refill today for her for an additional 2 weeks. Nonetheless I think that we are making excellent progress here. It was Enterococcus and Staphylococcus that were noted she is allergic to penicillin so there is not much I can do from the Enterococcus standpoint though the staff does seem to be sensitive to the Bactrim. Objective Constitutional Well-nourished and well-hydrated in no acute distress. Vitals Time Taken: 10:40 AM, Height: 68 in, Source: Stated, Source: Stated, Temperature: 98.1 F, Pulse: 55 bpm, Respiratory Rate: 18 breaths/min, Blood Pressure: 126/57 mmHg. Respiratory normal breathing without difficulty. Psychiatric this patient is able to make decisions and demonstrates good insight into disease  process. Alert and Oriented x 3. pleasant and cooperative. General Notes: Upon inspection patient's wound bed did not even require sharp debridement today it seems to be doing so well I am extremely pleased in this regard. Subsequently I did go ahead and perform the PuraPly application today since we are continuing to see improvement with this and I do believe it is helping to control her overall bacterial load. Subsequently I did not secure this with Steri-Strips as her skin does much better without the additional injury from the Steri-Strip application. She always seems to have a reaction to this. Integumentary (Hair, Skin) Wound #15 status is Open. Original cause of wound was Gradually Appeared. The date acquired was: 12/30/2019. The wound has been in treatment 58 weeks. The wound is located on the Left,Medial Malleolus. The wound measures 2.1cm length x 1cm width x 0.1cm depth; 1.649cm^2 area and 0.165cm^3 volume. There is Fat Layer (Subcutaneous Tissue) exposed. There is no tunneling or undermining noted. There is a medium amount of purulent drainage noted. The wound margin is flat and intact. There is large (67-100%) pink granulation within the wound bed. There is no necrotic tissue within the wound bed. 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 A skin graft procedure using a bioengineered skin substitute/cellular or tissue based product was performed by [name], PA with the following instrument(s): Forceps and Scissors. Puraply AM was applied and was not secured. 6 sq cm of product was utilized and 6 sq cm was wasted due to  wound size. Post Application, adaptic was applied. A Time Out was conducted at 11:27, prior to the start of the procedure. The procedure was tolerated well with a pain level of 0 throughout and a pain level of 0 following the procedure. Post procedure Diagnosis Wound #15: Same as Pre-Procedure . Pre-procedure diagnosis of Wound #15 is a Venous Leg Ulcer located on the Left,Medial Malleolus . There was a Double Layer Compression Therapy Procedure by [name] Gouty, RN. Post procedure Diagnosis Wound #15: Same as Pre-Procedure Plan Follow-up Appointments: Return Appointment in 1 week. - with Margarita Grizzle Cellular or Tissue Based Products: Cellular or Tissue Based Product Type: - puraply AM#11 Cellular or Tissue Based Product applied to wound bed, secured with steri-strips, cover with Adaptic or Mepitel. (DO NOT REMOVE). - no steristrips Bathing/ Shower/ Hygiene: May shower with protection but do not get wound dressing(s) wet. Edema Control - Lymphedema / SCD / Other: Elevate legs to the level of the heart or above for 30 minutes daily and/or when sitting, a frequency of: - throughout the day Avoid standing for long periods of time. Exercise regularly Compression stocking or Garment 20-30 mm/Hg pressure to: - right leg daily Additional Orders / Instructions: Follow Nutritious Diet The following medication(s) was prescribed: Bactrim DS oral 800 mg-160 mg tablet 1 1 tablet oral taken 2 times per day for 14 days starting 02/27/2021 WOUND #15: - Malleolus Wound Laterality: Left, Medial Cleanser: Soap and Water 1 x Per Week/30 Days Discharge Instructions: May shower and wash wound with dial antibacterial soap and water prior to dressing change. Peri-Wound Care: Triamcinolone 15 (g) 1 x Per Week/30 Days Discharge Instructions: Use triamcinolone 15 (g) as directed Peri-Wound Care: Sween Lotion (Moisturizing lotion) 1 x Per Week/30 Days Discharge Instructions: Apply moisturizing lotion as directed Prim  Dressing: PuraPly AM 1 x Per Week/30 Days ary Secondary Dressing: Woven Gauze Sponge, Non-Sterile 4x4 in 1 x Per Week/30 Days Discharge Instructions: to bolster skin sub Secondary Dressing: ABD Pad, 5x9 1 x Per Week/30 Days Discharge Instructions: Apply over primary dressing and lateral ankle Secondary Dressing: Optifoam Non-Adhesive Dressing, 4x4 in 1 x Per Week/30 Days Discharge Instructions: Apply over primary dressing pad lateral foot with foam donut Secondary Dressing: ADAPTIC TOUCH 3x4.25 in 1 x Per Week/30 Days Discharge Instructions: Apply over skin sub Compression Wrap: CoFlex TLC XL 2-layer Compression System 4x7 (in/yd) 1 x Per Week/30 Days Discharge Instructions: Apply CoFlex 2-layer compression as directed. (alt for 4 layer) 1. Would recommend currently that we continue with the PuraPly which we did today followed by the application of the compression wrapping which I think is also doing an awesome job for her. 2. I am also can recommend that we have the patient continue with specifically the Coflex 2 layer compression wrap which seems to be doing well she should also continue to elevate her leg. 3. I would also recommend that she continue to monitor for any signs of worsening such as increased pain if anything occurs she should let me know as soon as possible.  We will see patient back for reevaluation in 1 week here in the clinic. If anything worsens or changes patient will contact our office for additional recommendations. Electronic Signature(s) Signed: 03/01/2021 4:13:39 PM By: [name] PA-C Entered By: Worthy [name] on 03/01/2021 16:13:39 -------------------------------------------------------------------------------- SuperBill Details Patient Name: Date of Service: [facility] [name], [name] 02/27/2021 Medical Record Number: [id number] Patient Account Number: [id number] Date of Birth/Sex: Treating RN: [birth year] (65 y.o. [name] Primary Care Provider: Lennie [name] Other Clinician: Referring Provider: Treating Provider/Extender: [name] in Treatment: 10 Diagnosis Coding ICD-10 Codes Code Description [phone] Chronic venous hypertension (idiopathic) with ulcer and inflammation of left lower extremity L97.828 Non-pressure chronic ulcer of other part of left lower leg with other specified severity L97.328 Non-pressure chronic ulcer of left ankle with other specified severity Facility Procedures CPT4 Code: [id number] Description: [id number] PuraPly AM 3X4 (12sq. cm) enter 12qty Modifier: Quantity: 12 CPT4 Code: [id number] Description: [id number] - SKIN SUB GRAFT TRNK/ARM/LEG ICD-10 Diagnosis Description L97.828 Non-pressure chronic ulcer of other part of left lower leg with other specified s Modifier: everity Quantity: 1 Physician Procedures : CPT4 Code Description Modifier [id number] - WC PHYS SKIN SUB GRAFT TRNK/ARM/LEG ICD-10 Diagnosis Description L97.828 Non-pressure chronic ulcer of other part of left lower leg with other specified severity Quantity: 1 Electronic Signature(s) Signed: 03/01/2021 4:13:57 PM By: [name] PA-C Previous Signature: 02/27/2021 5:46:28 PM Version By: [name] Gouty RN, BSN Entered By: Worthy [name] on 03/01/2021 16:13:56

## 2021-03-06 ENCOUNTER — Encounter (HOSPITAL_BASED_OUTPATIENT_CLINIC_OR_DEPARTMENT_OTHER): Payer: Medicare Other | Attending: Physician Assistant | Admitting: Physician Assistant

## 2021-03-06 ENCOUNTER — Other Ambulatory Visit: Payer: Self-pay

## 2021-03-06 DIAGNOSIS — L97828 Non-pressure chronic ulcer of other part of left lower leg with other specified severity: Secondary | ICD-10-CM | POA: Insufficient documentation

## 2021-03-06 DIAGNOSIS — I87332 Chronic venous hypertension (idiopathic) with ulcer and inflammation of left lower extremity: Secondary | ICD-10-CM | POA: Diagnosis not present

## 2021-03-06 DIAGNOSIS — L97328 Non-pressure chronic ulcer of left ankle with other specified severity: Secondary | ICD-10-CM | POA: Diagnosis not present

## 2021-03-06 DIAGNOSIS — I10 Essential (primary) hypertension: Secondary | ICD-10-CM | POA: Insufficient documentation

## 2021-03-06 DIAGNOSIS — I872 Venous insufficiency (chronic) (peripheral): Secondary | ICD-10-CM | POA: Diagnosis present

## 2021-03-06 NOTE — Progress Notes (Addendum)
[name], [name] ([id number]) Visit Report for 03/06/2021 Chief Complaint Document Details Patient Name: Date of Service: [facility] [name], [name] 03/06/2021 10:00 A M Medical Record Number: [id number] Patient Account Number: [id number] Date of Birth/Sex: Treating RN: 05/19/1956 (65 y.o. [name] Primary Care Provider: [name] Other Clinician: Referring Provider: Treating Provider/Extender: [name] in Treatment: 20 Information Obtained from: Patient Chief Complaint patient is been followed long-term in this clinic for venous insufficiency ulcers with inflammation, hypertension and ulceration over the medial ankle bilaterally. 01/17/2020; this is a patient who is here for review of postoperative wounds on the left lateral ankle and recurrence of venous stasis ulceration on the left medial Electronic Signature(s) Signed: 03/06/2021 10:43:03 AM By: [name] PA-C Entered By: [name] on 03/06/2021 10:43:03 -------------------------------------------------------------------------------- Cellular or Tissue Based Product Details Patient Name: Date of Service: [facility] [name], [name] 03/06/2021 10:00 A M Medical Record Number: [id number] Patient Account Number: [id number] Date of Birth/Sex: Treating RN: 09/21/1955 (65 y.o. [name] Primary Care Provider: [name] Other Clinician: Referring Provider: Treating Provider/Extender: [name] in Treatment: 10 Cellular or Tissue Based Product Type Wound #15 Left,Medial Malleolus Applied to: Performed By: Physician [name], PA Cellular or Tissue Based Product Type: Puraply AM Level of Consciousness (Pre-procedure): Awake and Alert Pre-procedure Verification/Time Out Yes - 11:07 Taken: Location: trunk / arms / legs Wound Size (sq cm): 1.12 Product Size (sq cm): 12 Waste Size (sq cm): 6 Waste Reason: wound size Amount of Product Applied (sq cm):  6 Instrument Used: Forceps, Scissors Lot #: [id number] Order #: [id number] Expiration Date: 04/23/2023 Fenestrated: No Reconstituted: Yes Solution Type: normal saline Solution Amount: 3 ml Lot #: [id number] Solution Expiration Date: 03/02/2022 Secured: Yes Secured With: adaptic Dressing Applied: No Procedural Pain: 3 Post Procedural Pain: 3 Response to Treatment: Procedure was tolerated well Level of Consciousness (Post- Awake and Alert procedure): Post Procedure Diagnosis Same as Pre-procedure Electronic Signature(s) Signed: 03/07/2021 6:25:33 PM By: Deon Pilling RN, BSN Signed: 03/08/2021 9:30:20 AM By: [name] PA-C Entered By: Deon Pilling on 03/06/2021 11:11:08 -------------------------------------------------------------------------------- Debridement Details Patient Name: Date of Service: [name], [name] 03/06/2021 10:00 A M Medical Record Number: [id number] Patient Account Number: [id number] Date of Birth/Sex: Treating RN: [birth year] (65 y.o. [name], [name] Primary Care Provider: [name] Other Clinician: Referring Provider: Treating Provider/Extender: [name] in Treatment: 59 Debridement Performed for Assessment: Wound #15 Left,Medial Malleolus Performed By: Physician [name], PA Debridement Type: Debridement Severity of Tissue Pre Debridement: Fat layer exposed Level of Consciousness (Pre-procedure): Awake and Alert Pre-procedure Verification/Time Out Yes - 10:57 Taken: Start Time: 10:58 Pain Control: Lidocaine 4% T opical Solution T Area Debrided (L x W): otal 1.6 (cm) x 0.7 (cm) = 1.12 (cm) Tissue and other material debrided: Viable, Non-Viable, Skin: Dermis , Skin: Epidermis Level: Skin/Epidermis Debridement Description: Selective/Open Wound Instrument: Curette Bleeding: Minimum Hemostasis Achieved: Pressure End Time: 11:06 Procedural Pain: 0 Post Procedural Pain: 0 Response to Treatment: Procedure  was tolerated well Level of Consciousness (Post- Awake and Alert procedure): Post Debridement Measurements of Total Wound Length: (cm) 1.6 Width: (cm) 0.7 Depth: (cm) 0.1 Volume: (cm) 0.088 Character of Wound/Ulcer Post Debridement: Improved Severity of Tissue Post Debridement: Fat layer exposed Post Procedure Diagnosis Same as Pre-procedure Electronic Signature(s) Signed: 03/07/2021 6:25:33 PM By: Deon Pilling RN, BSN Signed: 03/08/2021 9:30:20 AM By: Melburn Hake,  [name] Kneip PA-C Entered By: Deon Pilling on 03/06/2021 11:07:39 -------------------------------------------------------------------------------- HPI Details Patient Name: Date of Service: [facility] [name], [name] 03/06/2021 10:00 A M Medical Record Number: [id number] Patient Account Number: [id number] Date of Birth/Sex: Treating RN: 08-Apr-1956 (65 y.o. [name] Primary Care Provider: [name] Other Clinician: Referring Provider: Treating Provider/Extender: [name] in Treatment: 56 History of Present Illness HPI Description: the remaining wound is over the left medial ankle. Similar wound over the right medial ankle healed largely with use of Apligraf. Most recently we have been using Hydrofera Blue over this wound with considerable improvement. The patient has been extensively worked up in the past for her venous insufficiency and she is not a candidate for antireflux surgery although I have none of the details available currently. 08/24/14; considerable improvement today. About 50% of this wound areas now epithelialized. The base of the wound appears to be healthier granulation.as opposed to last week when she had deteriorated a considerable improvement 08/17/14; unfortunately the wound has regressed somewhat. The areas of epithelialization from the superior aspect are not nearly as healthy as they were last week. The patient thinks her Hydrofera Blue slipped. 09/07/14; unfortunately the area  has markedly regressed in the 2 weeks since I've seen this. There is an odor surrounding erythema. The healthy granulation tissue that we had at the base of the wound now is a dusky color. The nurse reports green drainage 09/14/14; the area looks somewhat better than last week. There is less erythema and less drainage. The culture I did did not show any growth. Nevertheless I think it is better to continue the Cipro and doxycycline for a further week. The remaining wound area was debridement. 09/21/14. Wound did not require debridement last week. Still less erythema and less drainage. She can complete her antibiotics. The areas of epithelialization in the superior aspect of the wound do not look as healthy as they did some weeks ago 10/05/14 continued improvement in the condition of this wound. There is advancing epithelialization. Less aggressive debridement required 10/19/14 continued improvement in the condition and volume of this wound. Less aggressive debridement to the inferior part of this to remove surface slough and fibrinous eschar 11/02/14 no debridement is required. The surface granulation appears healthy although some of her islands of epithelialization seem to have regressed. No evidence of infection 11/16/14; lites surface debridement done of surface eschar. The wound does not look to be unhealthy. No evidence of infection. Unfortunately the patient has had podiatry issues in the right foot and for some reason has redeveloped small surface ulcerations in the medial right ankle. Her original presentation involved wounds in this area 11/23/14 no debridement. The area on the right ankle has enlarged. The left ankle wound appears stable in terms of the surface although there is periwound inflammation. There has been regression in the amount of new skin 11/30/14 no debridement. Both wound areas appear healthy. There was no evidence of infection. The the new area on the right medial ankle has  enlarged although that both the surfaces appear to be stable. 12/07/14; Debridement of the right medial ankle wound. No no debridement was done on the left. 12/14/14 no major change in and now bilateral medial ankle wounds. Both of these are very painful but the no overt evidence of infection. She has had previous venous ablation 12/21/14; patient states that her right medial ankle wound is considerably more painful last week than usual. Her left is also somewhat  painful. She could not tolerate debridement. The right medial ankle wound has fibrinous surface eschar 12/28/14 this is a patient with severe bilateral venous insufficiency ulcers. For a considerable period of time we actually had the one on the right medial ankle healed however this recently opened up again in June. The left medial ankle wound has been a refractory area with some absent flows. We had some success with Hydrofera Blue on this area and it literally closed by 50% however it is recently opened up Foley. Both of these were debridement today of surface eschar. She tolerates this poorly 01/25/15: No change in the status of this. Thick adherent escar. Very poor tolerance of any attempt at debridement. I had healed the right medial malleolus wound for a considerable amount of time and had the left one down to about 50% of the volume although this is totally regressed over the last 48 weeks. Further the right leg has reopened. she is trying to make a appointment with pain and vascular, previous ablations with Dr. Aleda Grana. I do not believe there is an arterial insufficiency issue here 02/01/15 the status of the adherent eschar bilaterally is actually improved. No debridement was done. She did not manage to get vascular studies done 02/08/15 continued debridement of the area was done today. The slough is less adherent and comes off with less pressure. There is no surrounding infection peripheral pulses are intact 02/15/15 selective  debridement with a disposable curette. Again the slough is less adherent and comes off with less difficulty. No surrounding infection peripheral pulses are intact. 02/22/15 selective debridement of the right medial ankle wound. Slough comes off with less difficulty. No obvious surrounding infection peripheral pulses are intact I did not debridement the one on the left. Both of these are stable to improved 03/01/15 selective debridement of both wound areas using a curette to. Adherent slough cup soft with less difficulty. No obvious surrounding infection. The patient tells me that 2 days ago she noted a rash above the right leg wrap. She did not have this on her lower legs when she change this over she arrives with widespread left greater than right almost folliculitis-looking rash which is extremely pruritic. I don't see anything to culture here. There is no rash on the rest of her body. She feels well systemically. 03/08/15; selective debridement of both wounds using a curette. Base of this does not look unhealthy. She had limegreen drainage coming out of the left leg wound and describes a lot of drainage. The rash on her left leg looks improved to. No cultures were done. 03/22/15; patient was not here last week. Basal wounds does not look healthy and there is no surrounding erythema. No drainage. There is still a rash on the left leg that almost looks vasculitic however it is clearly limited to the top of where the wrap would be. 04/05/15; on the right required a surgical debridement of surface eschar and necrotic subcutaneous tissue. I did not debridement the area on the left. These continue to be large open wounds that are not changing that much. We were successful at one point in healing the area on the right, and at the same time the area on the left was roughly half the size of current measurements. I think a lot of the deterioration has to do with the prolonged time the patient is on her feet at  work 04/19/15 I attempted-like surface debridement bilaterally she does not tolerate this. She tells me that she was in  allergic care yesterday with extreme pain over her left lateral malleolus/ankle and was told that she has an "sprain" 05/03/15; large bilateral venous insufficiency wounds over the medial malleolus/medial aspect of her ankles. She complains of copious amounts of drainage and his usual large amounts of pain. There is some increasing erythema around the wound on the right extending into the medial aspect of her foot to. historically she came in with these wounds the right one healed and the left one came down to roughly half its current size however the right one is reopened and the left is expanded. 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 05/10/15 large bilateral venous insufficiency wounds. There is less adherence surface left however the surface culture that I did last week grew pseudomonas therefore bilateral selective debridement score necessary. There is surrounding erythema. The patient describes severe bilateral drainage and a lot of pain in the left ankle. Apparently her podiatrist was were ready to do a cortisone shot 05/17/15; the patient complains of pain and again copious amounts of drainage. 05/24/15; we used Iodo flex last week. Patient notes considerable improvement in wound drainage. Only needed to change this once. 05/31/15; we continued Iodoflex; the base of these large wounds bilaterally is not too bad but there is probably likely a significant bioburden here. I would like to debridement just doesn't tolerate it. 06/06/14 I would like to continue the Iodoflex although she still hasn't managed to obtain supplies. She has bilateral medial malleoli or large wounds which are mostly superficial. Both of them are covered circumferentially with some nonviable fibrinous slough although she tolerates debridement very poorly.  She apparently has an appointment for an ablation on the right leg by interventional radiology. 06/14/15; the patient arrives with the wounds and static condition. We attempted a debridement although she does not do well with this secondary to pain. I 07/05/15; wounds are not much smaller however there appears to be a cleaner granulating base. The left has tight fibrinous slough greater than the right. Debridement is tolerated poorly due to pain. Iodoflex is done more for these wounds in any of the multitude of different dressings I have tried on the left 1 and then subsequently the right. 07/12/15; no change in the condition of this wound. I am able to do an aggressive debridement on the right but not the left. She simply cannot tolerate it. We have been using Iodoflex which helps somewhat. It is worthwhile remembering that at one point we healed the right medial ankle wound and the left was about 25% of the current circumference. We have suggested returning to vascular surgery for review of possible further ablations for one reason or another she has not been able to do this. 07/26/15 no major change in the condition of either wound on her medial ankle. I did not attempt to debridement of these. She has been aggressively scrubbing these while she is in the shower at home. She has her supply of Iodoflex which seems to have done more for these wounds then anything I have put on recently. 08/09/15 wound area appears larger although not verified by measurements. Using Iodoflex 09/05/2015 -- she was here for avisit today but had significant problems with the wound and I was asked to see her for a physician opinion. I have summarize that this lady has had surgery on her left lower extremity about 10 years ago where the possible veins stripping was  done. She has had an opinion from interventional radiology around November 2016 where no further sclerotherapy was ordered. The patient works 12 hours a day and  stands on a concrete floor with work boots and is unable to get the proper compression she requires and cannot elevate her limbs appropriately at any given time. She has recently grown Pseudomonas from her wound culture but has not started her ciprofloxacin which was called in for her. 09/13/15 this continues to be a difficult situation for this patient. At one point I had this wound down to a 1.5 x 1.5" wound on her left leg. This is deteriorated and the right leg has reopened. She now has substantial wounds on her medial calcaneus, malleoli and into her lower leg. One on the left has surface eschar but these are far too painful for me to debridement here. She has a vascular surgery appointment next week to see if anything can be done to help here. I think she has had previous ablations several years ago at [facility]. She has no major edema. She tells me that she did not get product last time [facility]] and went for several days without it. She continues to work in work boots 12 hours a day. She cannot get compression/4-layer under her work boots. 09/20/15 no major change. Periwound edema control was not very good. Her point with pain and vascular is next Wednesday the 25th 09/28/15; the patient is seen vascular surgery and is apparently scheduled for repeat duplex ultrasounds of her bilateral lower legs next week. 10/05/15; the patient was seen by Dr. Doren Custard of vascular surgery. He feels that she should have arterial insufficiency excluded as cause/contributed to her nonhealing stage she is therefore booked for an arteriogram. She has apparently monophasic signals in the dorsalis pedis pulses. She also of course has known severe chronic venous insufficiency with previous procedures as noted previously. I had another long discussion with the patient today about her continuing to work 12 hour shifts. I've written her out for 2 months area had concerns about this as her work location is currently  undergoing significant turmoil and this may lead to her termination. She is aware of this however I agree with her that she simply cannot continue to stand for 12 hours multiple days a week with the substantial wound areas she has. 10/19/15; the Dr. Doren Custard appointment was largely for an arteriogram which was normal. She does not have an arterial issue. He didn't make a comment about her chronic venous insufficiency for which she has had previous ablations. Presumably it was not felt that anything additional could be done. The patient is now out of work as I prescribed 2 weeks ago. Her wounds look somewhat less aggravated presumably because of this. I felt I would give debridement another try today 10/25/15; no major change in this patient's wounds. We are struggling to get her product that she can afford into her own home through her insurance. 11/01/15; no major change in the patient's wounds. I have been using silver alginate as the most affordable product. I spoke to Dr. Marla Roe last week with her requested take her to the OR for surgical debridement and placement of ACEL. Dr. Marla Roe told me that she would be willing to do this however [employer] will not cover this, fortunately the patient has [place] healthcare of some variant 11/08/15; no major change in the patient's wounds. She has been completely nonviable surface that this but is in too much  pain with any attempted debridement are clinic. I have arranged for her to see Dr. Marla Roe ham of plastic surgery and this appointment is on Monday. I am hopeful that they will take her to the OR for debridement, possible ACEL ultimately possible skin graft 11/22/15 no major change in the patient's wounds over her bilateral medial calcaneus medial malleolus into the lower legs. Surface on these does not look too bad however on the left there is surrounding erythema and tenderness. This may be cellulitis or could him sleepy tinea. 11/29/15; no  major changes in the patient's wounds over her bilateral medial malleolus. There is no infection here and I don't think any additional antibiotics are necessary. There is now plan to move forward. She sees Dr. Marla Roe in a week's time for preparation for operative debridement and ACEL placement I believe on 7/12. She then has a follow-up appointment with Dr. Marla Roe on 7/21 12/28/15; the patient returns today having been taken to the [place] by Dr. Marla Roe 12/12/15 she underwent debridement, intraoperative cultures [which were negative]. She had placement of a wound VAC. Parent really ACEL was not available to be placed. The wound VAC foam apparently adhered to the wound since then she's been using silver alginate, Xeroform under Ace wraps. She still says there is a lot of drainage and a lot of pain 01/31/16; this is a patient I see monthly. I had referred her to Dr. Marla Roe him of plastic surgery for large wounds on her bilateral medial ankles. She has been to the OR twice once in early July and once in early August. She tells me over the last 3 weeks she has been using the wound VAC with ACEL underneath it. On the right we've simply been using silver alginate. Under Kerlix Coban wraps. 02/28/16; this is a patient I'm currently seeing monthly. She is gone on to have a skin graft over her large venous insufficiency ulcer on the left medial ankle. This was done by Dr. Marla Roe him. The patient is a bit perturbed about why she didn't have one on her right medial ankle wound. She has been using silver alginate to this. 03/06/16; I received a phone call from her plastic surgery Dr. Marla Roe. She expressed some concern about the viability of the skin graft she did on the left medial ankle wound. Asked me to place Endoform on this. She told me she is not planning to do a subsequent skin graft on the right as the left one did not take very well. I had placed Hydrofera Blue on the right 03/13/16;  continue to have a reasonably healthy wound on the right medial ankle. Down to 3 mm in terms of size. There is epithelialization here. The area on the left medial ankle is her skin graft site. I suppose the last week this looks somewhat better. She has an open area inferiorly however in the center there appears to be some viable tissue. There is a lot of surface callus and eschar that will eventually need to come off however none of this looked to be infected. Patient states that the is able to keep the dressing on for several days which is an improvement. 03/20/16 no major change in the circumference of either wound however on the left side the patient was at Dr. Eusebio Friendly office and they did a debridement of left wound. 50% of the wound seems to be epithelialized. I been using Endoform on the left Hydrofera Blue in the right 03/27/16; she arrives today with her  wound is not looking as healthy as they did last week. The area on the right clearly has an adherent surface to this a very similar surface on the left. Unfortunately for this patient this is all too familiar problem. Clearly the Endoform is not working and will need to change that today that has some potential to help this surface. She does not tolerate debridement in this clinic very well. She is changing the dressing wants 04/03/16; patient arrives with the wounds looking somewhat better especially on the right. Dr. Migdalia Dk change the dressing to silver alginate when she saw her on Monday and also sold her some compression socks. The usefulness of the latter is really not clear and woman with severely draining wounds. 04/10/16; the patient is doing a bit of an experiment wearing the compression stockings that Dr. Migdalia Dk provided her to her left leg and the out of legs based dressings that we provided to the right. 05/01/16; the patient is continuing to wear compression stockings Dr. Migdalia Dk provided her on the left that are apparently silver  impregnated. She has been using Iodoflex to the right leg wound. Still a moderate amount of drainage, when she leaves here the wraps only last for 4 days. She has to change the stocking on the left leg every night 05/15/16; she is now using compression stockings bilaterally provided by Dr. Marla Roe. She is wearing a nonadherent layer over the wounds so really I don't think there is anything specific being done to this now. She has some reduction on the left wound. The right is stable. I think all healing here is being done without a specific dressing 06/09/16; patient arrives here today with not much change in the wound certainly in diameter to large circular wounds over the medial aspect of her ankle bilaterally. Under the light of these services are certainly not viable for healing. There is no evidence of surrounding infection. She is wearing compression stockings with some sort of silver impregnation as prescribed by Dr. Marla Roe. She has a follow-up with her tomorrow. 06/30/16; no major change in the size or condition of her wounds. These are still probably covered with a nonviable surface. She is using only her purchase stockings. She did see Dr. Marla Roe who seemed to want to apply Dakin's solution to this I'm not extreme short what value this would be. I would suggest Iodoflex which she still has at home. 07/28/16; I follow Mrs. Naef episodically along with Dr. Marla Roe. 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 She has been applying a topical collagen ointment to both wounds with Adaptic. I don't think Dr. Marla Roe is planning to take her back to the OR. 08/19/16; I follow Mrs. Jeneen Rinks on a monthly basis along with Dr. Marla Roe of plastic surgery. 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 I been following her for a number of years. At one point I was  able to get the right medial malleolus wound to heal and had the left medial malleolus down to about half its current size however and I had to send her to plastic surgery for an operative debridement. Since then things have been stable to slightly improve the area on the right is slightly better one in the left about the same although there is much less adherent surface than I'm used to with this patient. She is using some form of liquid collagen gel that Dr.  Dillingham provided a Kerlix cover with the patient's own pressure stockings. She tells me that she has extreme pain in both ankles and along the lateral aspect of both feet. She has been unable to work for some period of time. She is telling me she is retiring at the beginning of April. She sees Dr. Doran Durand of orthopedics next week 09/22/16; patient has not seen Dr. Marla Roe since the last time she is here. I'm not really sure what she is using to the wounds other than bits and pieces of think she had left over including most recently Hydrofera Blue. She is using juxtalite stockings. She is having difficulty with her husband's recent illness "stroke". She is having to transport him to various doctors appointments. Dr. Marla Roe left her the option of a repeat debridement with ACEL however she has not been able to get the time to follow-up on this. She continues to have a fair amount of drainage out of these wounds with certainly precludes leaving dressings on all week 10/13/16; patient has not seen Dr. Marla Roe since she was last in our clinic. I'm not really sure what she is doing with the wounds, we did try to get her [facility] and I think she is actually using this most of the time. Because of drainage she states she has to change this every second day although this is an improvement from what she used to do. She went to see Dr. Doran Durand who did not think she had a muscular issue with regards to her feet, he referred her to a neurologist  and I think the appointment is sometime in June. I changed her back to Iodoflex which she has used in the past but not recently. 11/03/16; the patient has been using Iodoflex although she ran out of this. Still claims that there is a lot of drainage although the wound does not look like this. No surrounding erythema. She has not been back to see Dr. Marla Roe 11/24/16; the patient has been using Iodoflex again but she ran out of it 2 or 3 days ago. There is no major change in the condition of either one of these wounds in fact they are larger and covered in a thick adherent surface slough/nonviable tissue especially on the left. She does not tolerate mechanical debridement in our clinic. Going back to see Dr. Marla Roe of plastic surgery for an operative debridement would seem reasonable. 12/15/16; the patient has not been back to see Dr. Marla Roe. She is been dealing with a series of illnesses and her husband which of monopolized her time. She is been using Sorbact which we largely supplied. She states the drainage is bad enough that it maximum she can go 2-3 days without changing the dressing 01/12/2017 -- the patient has not been back for about 4 weeks and has not seen Dr. Marla Roe not does she have any appointment pending. 01/23/17; patient has not seen Dr. Marla Roe even though I suggested this previously. She is using Santyl that was suggested last week by Dr. Con Memos this Cost her $16 through her insurance which is indeed surprising 02/12/17; continuing Santyl and the patient is changing this daily. A lot of drainage. She has not been back to see plastic surgery she is using an Ace wrap. Our intake nurse suggested wrap around stockings which would make a good reasonable alternative 02/26/17; patient is been using Santyl and changing this daily due to drainage. She has not been to see plastic surgery she uses in April Ace wrap  to control the edema. She did obtain extremitease stockings but stated  that the edema in her leg was to big for these 03/20/17; patient is using Santyl and Anasept. Surfaces looked better today the area on the right is actually measuring a little smaller. She has states she has a lot of pain in her feet and ankles and is asking for a consult to pain control which I'll try to help her with through our case manager. 04/10/17; the patient arrives with better-looking wound surfaces and is slightly smaller wound on the left she is using a combination of Santyl and Anasept. She has an appointment or at least as started in the pain control center associated with [facility] 05/14/17; this is a patient who I followed for a prolonged period of time. She has venous insufficiency ulcers on her bilateral medial ankles. At one point I had this down to a much smaller wound on the left however these reopened and we've never been able to get these to heal. She has been using Santyl and Anasept gel although 2 weeks ago she ran out of the Anasept gel. She has a stable appearance of the wound. She is going to the wound care clinic at [facility]. They wanted do a nerve block/spinal block although she tells me she is reluctant to go forward with that. 05/21/17; this is a patient I have followed for many years. She has venous insufficiency ulcers on her bilateral medial ankles. Chronic pain and deformity in her ankles as well. She is been to see plastic surgery as well as orthopedics. Using PolyMem AG most recently/Kerramax/ABDs and 2 layer compression. She has managed to keep this on and she is coming in for a nurse check to change the dressing on Tuesdays, we see her on Fridays 06/05/17; really quite a good looking surface and the area especially on the right medial has contracted in terms of dimensions. Well granulated healthy-looking tissue on both sides. Even with an open curet there is nothing that even feels abnormal here. This is as good as I've seen this in quite some time.  We have been using PolyMem AG and bringing her in for a nurse check 06/12/17; really quite good surface on both of these wounds. The right medial has contracted a bit left is not. We've been using PolyMem and AG and she is coming in for a nurse visit 06/19/17; we have been using PolyMem AG and bringing her in for a nurse check. Dimensions of her wounds are not better but the surfaces looked better bilaterally. She complained of bleeding last night and the left wound and increasing pain bilaterally. She states her wound pain is more neuropathic than just the wounds. There was some suggestion that this was radicular from her pain management doctor in talking to her it is really difficult to sort this out. 06/26/17; using PolyMem and AG and bringing her in for a nurse check as All of this and reasonably stable condition. Certainly not improved. The dimensions on the lateral part of the right leg look better but not really measuring better. The medial aspect on the left is about the same. 07/03/16; we have been using PolyMen AG and bringing her in for a nurse check to change the dressings as the wounds have drainage which precludes once weekly changing. We are using all secondary absorptive dressings.our intake nurse is brought up the idea of using a wound VAC/snap VAC on the wound to help with the drainage to see  if this would result in some contraction. This is not a bad idea. The area on the right medial is actually looking smaller. Both wounds have a reasonable-looking surface. There is no evidence of cellulitis. The edema is well controlled 07/10/17; the patient was denied for a snap VAC by her insurance. The major issue with these wounds continues to be drainage. We are using wicked PolyMem AG and she is coming in for a nurse visit to change this. The wounds are stable to slightly improved. The surface looks vibrant and the area on the right certainly has shrunk in size but very slowly 07/17/17; the  patient still has large wounds on her bilateral medial malleoli. Surface of both of these wounds looks better. The dimensions seem to come and go but no consistent improvement. There is no epithelialization. We do not have options for advanced treatment products due to insurance issues. They did not approve of the wound VAC to help control the drainage. More recently we've been using PolyMem and AG wicked to allow drainage through. We have been bringing her in for a nurse visit to change this. We do not have a lot of options for wound care products and the home again due to insurance issues 07/24/17; the patient's wound actually looks somewhat better today. No drainage measurements are smaller still healthy-looking surface. We used silver collagen under PolyMen started last week. We have been bringing her in for a dressing change 07/31/17; patient's wound surface continued to look better and I think there is visible change in the dimensions of the wound on the right. Rims of epithelialization. We have been using silver collagen under PolyMen and bringing her in for a dressing change. There appears to be less drainage although she is still in need of the dressing change 08/07/17. Patient's wound surface continues to look better on both sides and the area on the right is definitely smaller. We have been using silver collagen and PolyMen. She feels that the drainage has been it has been better. I asked her about her vascular status. She went to see Dr. Aleda Grana at [facility] and had some form of ablation. I don't have much detail on this. I haven't my notes from 2016 that she was not a candidate for any further ablation but I don't have any more information on this. We had referred her to vein and vascular I don't think she ever went. He does not have a history of PAD although I don't have any information on this either. We don't even have ABIs in our record 08/14/17; we've been using silver collagen and  PolyMen cover. And putting the patient and compression. She we are bringing her in as a nurse visit to change this because ofarge amount of drainage. We didn't the ABIs in clinic today since they had been done in many moons 1.2 bilaterally. She has been to see vein and vascular however this was at [facility] and she had ablation although I really don't have any information on this all seemed biking get a report. She is also been operatively debrided by plastic surgery and had a cell placed probably 8-12 months ago. This didn't have a major effect. We've been making some gains with current dressings 08/19/17-She is here in follow-up evaluation for bilateral medial malleoli ulcers. She continues to tolerate debridement very poorly. We will continue with recently changed topical treatment; if no significant improvement may consider switching to Iodosorb/Iodoflex. She will follow-up next week 08/27/17; bilateral medial malleoli  ulcers. These are chronic. She has been using silver collagen and PolyMem. I believe she has been used and tried on Iodoflex before. 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 These have been very refractory over time. We have been using silver collagen and PolyMen. She is coming in once a week for a doctor's and once a week for nurse visits. We are actually making some progress 09/18/17; the patient's wounds are smaller especially on the right medial. She arrives today to upset to consider even washing these off with Anasept which I think is been part of the reason this is been closing. We've been using collagen covered in PolyMen otherwise. It is noted that she has a small area of folliculitis on the right medial calf that. As we are wrapping her legs I'll give her a short course of  doxycycline to make sure this doesn't amount to anything. She is a long list of complaints today including imbalance, shortness of breath on exertion, inversion of her left ankle. With regards to the latter complaints she is been to see orthopedics and they offered her a tendon release surgery I believe but wanted her wounds to be closed first. I have recommended she go see her primary physician with regards to everything else. 09/25/17; patient's wounds are about the same size. We have made some progress bilaterally although not in recent weeks. She will not allow me T wash these wounds with Anasept even if she is doing her cell. Wheeze we've been using collagen covered in PolyMen. Last week she had a small area of folliculitis this is now opened into a small wound. She completed 5 days of trimethoprim sulfamethoxazole 10/02/17; unfortunately the area on her left medial ankle is worse with a larger wound area towards the Achilles. The patient complains of a lot of pain. She will not allow debridement although visually I don't think there is anything to debridement in any case. We have been using silver collagen and PolyMen for several months now. Initially we are making some progress although I'm not really seeing that today. We will move back to [facility]. His admittedly this is a bit of a repeat however I'm hoping that his situation is different now. The patient tells me she had her leg on the left give out on her yesterday this is process some pain. 10/09/17; the patient is seen twice a week largely because of drainage issues coming out of the chronic medial bimalleolar wounds that are chronic. Last week the dimensions of the one on the left looks a little larger I changed her to [facility]. She comes in today with a history of terrible pain in the bilateral wound areas. She will not allow debridement. She will not even allow a tissue culture. There is no surrounding erythema no no evidence of  cellulitis. We have been putting her Kerlix Coban man. She will not allow more aggressive compression as there was a suggestion to put her in 3 layer wraps. 10/16/17; large wounds on her bilateral medial malleoli. These are chronic. Not much change from last week. The surface looks have healthy but absolutely no epithelialization. A lot of pain little less so of drainage. She will not allow debridement or even washing these off in the vigorous fashion with Anasept. 10/23/17; large wounds on her  bilateral malleoli which are chronic. Some improvement in terms of size perhaps on the right since last time I saw these. She states that after we increased the 3 layer compression there was some bleeding, when she came in for a nurse visit she did not want 3 layer compression put back on about our nurse managed to convince her. She has known chronic venous visit issues and I'm hoping to get her to tolerate the 3 layer compression. using Hydrofera Blue 10/30/17; absolutely no change in the condition of either wound although we've had some improvement in dimensions on the right.. Attempted to put her in 3 layer compression she didn't tolerated she is back in 2 layer compression. We've been using Hydrofera Blue We looked over her past records. She had venous reflux studies in November 2016. There was no evidence of deep venous reflux on the right. Superficial vein did not show the greater saphenous vein at think this is been previously ablated the small saphenous vein was within normal limits. The left deep venous system showed no DVT the vessels were positive for deep venous reflux in the posterior tibial veins at the ankle. The greater saphenous vein was surgically absent small saphenous vein was within normal limits. She went to vein and vascular at [facility]. I believe she had an ablation on the left greater saphenous vein. I'll update her reflux studies perhaps ever reviewed by vein and vascular. We've made  absolutely no progress in these wounds. Will also try to read and TheraSkins through her insurance 11/06/17; W the patient apparently has a 2 week follow-up with vein and vascular I like him to review the whole issue with regards to her previous vascular workup by Dr. Aleda Grana. We've really made no progress on these wounds in many months. She arrives today with less viable looking surface on the left medial ankle wound. This was apparently looking about the same on Tuesday when she was here for nurse visit. 11/13/17; deep tissue culture I did last time of the left lower leg showed multiple organisms without any predominating. In particular no Staphylococcus or group A strep were isolated. We sent her for venous reflux studies. She's had a previous left greater saphenous vein stripping and I think sclerotherapy of the right greater saphenous vein. She didn't really look at the lesser saphenous vein this both wounds are on the medial aspect. She has reflux in the common femoral vein and popliteal vein and an accessory vein on the right and the common femoral vein and popliteal vein on the left. I'm going to have her go to see vein and vascular just the look over things and see if anything else beside aggressive compression is indicated here. We have not been able to make any progress on these wounds in spite of the fact that the surface of the wounds is never look too bad. 11/20/17; no major change in the condition of the wounds. Patient reports a large amount of drainage. She has a lot of complaints of pain although enlisting her today I wonder if some of this at least his neuropathic rather than secondary to her wounds. She has an appointment with vein and vascular on 12/30/17. The refractory nature of these wounds in my mind at least need vein and vascular to look over the wounds the recent reflux studies we did and her history to see if anything further can be done here. I also note her gait is  deteriorated quite a bit. Looks like she  has inversion of her foot on the right. She has a bilateral Trendelenburg gait. I wonder if this is neuropathic or perhaps multilevel radicular. 11/27/17; her wounds actually looks slightly better. Healthy-looking granulation tissue a scant amount of epithelialization. [place] healthcare will not pay for [employer]. They will play for tri layer Oasis and Dermagraft. This is not a diabetic ulcer. We'll try for the tri layer Oasis. She still complains of some drainage. She has a vein and vascular appointment on 12/30/17 12/04/17; the wounds visually look quite good. Healthy-looking granulation with some degree of epithelialization. We are still waiting for response to our request for trial to try layer Oasis. Her appointment with vascular to review venous and arterial issues isn't sold the end of July 7/31. Not allow debridement or even vigorous cleansing of the wound surface. 12/18/17; slightly smaller especially on the right. 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 We've been using Hydrofera Blue. We still are looking into triple layer Oasis through her insurance 01/08/18 on evaluation today patient's wound actually appears to be showing signs of good improvement at this point in time. She has been tolerating the dressing changes without complication. Fortunately there does not appear to be any evidence of infection at this point in time. We have been utilizing silver nitrate which does seem to be of benefit for her which is also good news. Overall I'm very happy with how things seem to be both regards appearance as well as measurement. Patient did see Dr. Bridgett Larsson for evaluation on 12/30/17. In his assessment he felt that stripping would not likely add much more than chronic compression to the patient's healing process. His recommendation was to follow-up in three months with Dr. Doren Custard if she hasn't healed in order to consider referral back to  you and see vascular where she previously was in a trial and was able to get her wound to heal. I'll be see what she feels she when you staying compression and he reiterated this as well. 01/13/18 on evaluation today patient appears to actually be doing very well in regard to her bilateral medial malleolus ulcers. She seems to have tolerated the chemical cauterization with silver nitrate last week she did have some pain through that evening but fortunately states that I'll be see since it seems to be doing better she is overall pleased with the progress. 01/21/18; really quite a remarkable improvement since I've last seen these wounds. We started using silver nitrate specially on the islands of hyper granulation which for some reason her around the wound circumference. This is really done quite nicely. Primary dressing Hydrofera Blue under 4 layer compression. She seems to be able to hold out without a nurse rewrap. Follow-up in 1 week 01/28/18; we've continued the hydrofera blue but continued with chemical cauterization to the wound area that we started about a month ago for irregular hyper granulation. She is made almost stunning improvement in the overall wound dimensions. I was not really expecting this degree of improvement in these chronic wounds 02/05/18; we continue with Hydrofera Bluebut of also continued the aggressive chemical cauterization with silver nitrate. We made nice progress with the right greater than left wound. 02/12/18. We continued with Hydrofera Blue after aggressive chemical cauterization with silver nitrate. We appear to be making nice progress with both wound areas 02/19/2018; we continue with [facility] after washing the wounds vigorously with Anasept spray and chemical cauterization with silver nitrate. We are making excellent progress. The area on the right's just  about closed 02/26/2018. The area on the left medial ankle had too much necrotic debris today. I used a #5  curette we are able to get most of the soft. I continued with the silver nitrate to the much smaller wound on the right medial ankle she had a new area on her right lower pretibial area which she says was due to a role in her compression 03/05/2018; both wound areas look healthy. Not much change in dimensions from last week. I continue to use silver nitrate and Hydrofera Blue. The patient saw Dr. Doren Custard of vein and vascular. He felt she had venous stasis ulcers. He felt based on her previous arteriogram she should have adequate circulation for healing. Also she has deep venous reflux but really no significant correctable superficial venous reflux at this time. He felt we should continue with conservative management including leg elevation and compression 04/02/2018; since we last saw this woman about a month ago she had a fall apparently suffered a pelvic fracture. I did not look up the x-ray. Nevertheless because of pain she literally was bedbound for 2 weeks and had home health coming out to change the dressing. Somewhat predictably this is resulted in considerable improvement in both wound areas. The right is just about closed on the medial malleolus and the left is about half the size. 04/16/2018; both her wounds continue to go down in size. Using Hydrofera Blue. 05/07/18; both her wounds appeared to be improving especially on the right where it is almost closed. We are using Hydrofera Blue 05/14/2018; slightly worse this week with larger wounds. Surface on the left medial not quite as good. We have been using Hydrofera Blue 05/21/18; again the wounds are slightly larger. Left medial malleolus slightly larger with eschar around the circumference. We have been using Hydrofera Blue undergoing a wraps for a prolonged period of time. This got a lot better when she was more recumbent due to a fall and a back injury. I change the primary dressing the silver alginate today. She did not tolerate a 4 layer  compression previously although I may need to bring this up with her next time 05/28/2018; area on the left medial malleolus again is slightly larger with more drainage. Area on the right is roughly unchanged. She has a small area of folliculitis on the right medial just on the lower calf. This does not look ominous. 06/03/2018 left medial malleolus slightly smaller in a better looking surface. We used silver nitrate on this last time with silver alginate. The area on the right appears slightly smaller 1/10; left medial malleolus slightly smaller. Small open area on the right. We used silver nitrate and silver alginate as of 2 weeks ago. We continue with the wound and compression. These got a lot better when she was off her feet 1/17; right medial malleolus wound is smaller. The left may be slightly smaller. Both surfaces look somewhat better. 1/24; both wounds are slightly smaller. Using silver alginate under Unna boots 1/31; both wounds appear smaller in fact the area on the right medial is just about closed. Surface eschar. We have been using silver alginate under Unna boots. The patient is less active now spends let much less time on her feet and I think this is contributed to the general improvement in the wound condition 2/7; both wounds appear smaller. I was hopeful the right medial would be closed however there there is still the same small open area. Slight amount of surface eschar on the  left the dimensions are smaller there is eschar but the wound edges appear to be free. We have been using silver alginate under Unna boot's 2/14; both wounds once again measure smaller. Circumferential eschar on the left medial. We have been using silver alginate under Unna boots with gradual improvement 2/21; the area on the right medial malleolus has healed. The area on the left is smaller. We have been using silver alginate and Unna boots. We can discharge wrapping the right leg she has 20/30 stockings at  home she will need to protect the scar tissue in this area 2/28; the area on the right medial malleolus remains closed the patient has a compression stocking. The area on the left is smaller. We have been using silver alginate and Unna boots. 3/6 the area on the right medial ankle remains closed. Good edema control noted she is using her own compression stocking. The area on the left medial ankle is smaller. We have been managing this with silver alginate and Unna boots which we will continue today. 3/13; the area on the right medial ankle remains closed and I'm declaring it healed today. When necessary the left is about the same still a healthy-looking surface but no major change and wound area. No evidence of infection and using silver alginate under unna and generally making considerable improvement 3/27 the area on the right medial ankle remains closed the area on the left is about the same as last week. Certainly not any worse we have been using silver alginate under an Unna boot 4/3; the area on the right medial ankle remains closed per the patient. We did not look at this wound. The wound on the left medial ankle is about the same surface looks healthy we have been using silver alginate under an Unna boot 4/10; area on the right medial ankle remains closed per the patient. We did not look at this wound. The wound on the left medial ankle is slightly larger. The patient complains that the [facility] caused burning pain all week. She also told us that she was a lot more active this week. Changed her back to silver alginate 4/17; right medial ankle still closed per the patient. Left medial ankle is slightly larger. Using silver alginate. She did not tolerate Hydrofera Blue on this area 4/24; right medial ankle remains closed we have not look at this. The left medial ankle continues to get larger today by about a centimeter. We have been using silver alginate under Unna boots. She complains  about 4 layer compression as an alternative. She has been up on her feet working on her garden 5/8; right medial ankle remains closed we did not look at this. The left medial ankle has increased in size about 100%. We have been using silver alginate under Unna boots. She noted increased pain this week and was not surprised that the wound is deteriorated 5/15; no major change in SA however much less erythema ( one week of doxy ocellulitis). 5/22-65 year old female returns at 1 week to the clinic for left medial ankle wound for which we have been using silver alginate under 3 layer compression She was placed on DOXY at last visit - the wound is wider at this visit. She is in 3 layer compression 5/29; change to [facility] last week. I had given her empiric doxycycline 2 weeks ago for a week. She is in 3 layer compression. She complains of a lot of pain and drainage on presentation today. 6/5; using  Hydrofera Blue. I gave her doxycycline recently empirically for erythema and pain around the wound. Believe her cultures showed enterococcus which not would not have been well covered by doxycycline nevertheless the wound looks better and I don't feel specifically that the enterococcus needs to be covered. She has a new what looks like a wrap injury on her lateral left ankle. 6/12; she is using Hydrofera Blue. She has a new area on the left anterior lower tibial area. This was a wrap injury last week. 6/19; the patient is using Hydrofera Blue. She arrived with marked inflammation and erythema around the wound and tenderness. 12/01/18 on evaluation today patient appears to be doing a little bit better based on what I'm hearing from the standpoint of lassos evaluation to this as far as the overall appearance of the wound is concerned. Then sometime substandard she typically sees Dr. Dellia Nims. Nonetheless overall very pleased with the progress that she's made up to this point. No fevers, chills, nausea, or  vomiting noted at this time. 7/10; some improvement in the surface area. Aggressively debrided last week apparently. I went ahead with the debridement today although the patient does not tolerate this very well. We have been using Iodoflex. Still a fair amount of drainage 7/17; slightly smaller. Using Iodoflex. 7/24; no change from last week in terms of surface area. We have been using Iodoflex. Surface looks and continues to look somewhat better 7/31; surface area slightly smaller better looking surface. We have been using Iodoflex. This is under Unna boot compression 8/7-Patient presents at 1 week with Unna boot and Iodoflex, wound appears better 8/14-Patient presents at 1 week with Iodoflex, we use the Unna boot, wound appears to be stable better.Patient is getting Botox treatment for the inversion of the foot for tendon release, Next week 8/21; we are using Iodoflex. Unna boot. The wound is stable in terms of surface area. Under illumination there is some areas of the wound that appear to be either epithelialized or perhaps this is adherent slough at this point I was not really clear. It did not wipe off and I was reluctant to debride this today. 8/28; we are using Iodoflex in an Unna boot. Seems to be making good improvement. 9/4; using Iodoflex and wound is slightly smaller. 9/18; we are using Iodoflex with topical silver nitrate when she is here. The wound continues to be smaller 10/2; patient missed her appointment last week due to GI issues. She left and Iodoflex based dressing on for 2 weeks. Wound is about the same size about the size of a dime on the left medial lower 10/9 we have been using Iodoflex on the medial left ankle wound. She has a new superficial probable wrap injury on the dorsal left ankle 10/16; we have been using Hydrofera Blue since last week. This is on the left medial ankle 10/23; we have been using Hydrofera Blue since 2 weeks ago. This is on the left medial ankle.  Dimensions are better 11/6; using Hydrofera Blue. I think the wound is smaller but still not closed. Left medial ankle 11/13; we have been using Hydrofera Blue. Wound is certainly no smaller this week. Also the surface not as good. This is the remanent of a very large area on her left medial ankle. 11/20; using Sorbact since last week. Wound was about the same in terms of size although I was disappointed about the surface debris 12/11; 3-week follow-up. Patient was on vacation. Wound is measuring slightly larger we have been  using Sorbact. 12/18; wound is about the same size however surface looks better last week after debridement. We have been using Sorbact under compression 1/15 wound is probably twice the size of last time increased in length nonviable surface. We have been using Sorbact. She was running a mild fever and missed her appointment last week 1/22; the wound is come down in size but under illumination still a very adherent debris we have been Hydrofera Blue that I changed her to last week 1/29; dimensions down slightly. We have been using Hydrofera Blue 2/19 dimensions are the same however there is rims of epithelialization under illumination. Therefore more the surface area may be epithelialized 2/26; the patient's wound actually measures smaller. The wound looks healthy. We have been using Hydrofera Blue. I had some thoughts about running Apligraf then I still may do that however this looks so much better this week we will delay that for now 3/5; the wound is small but about the same as last week. We have been using Hydrofera Blue. No debridement is required today. 3/19; the wound is about the size of a dime. Healthy looking wound even under illumination. We have been using Hydrofera Blue. No mechanical debridement is necessary 3/26; not much change from last week although still looks very healthy. We have been using Hydrofera Blue under Unna boots Patient was offered an ankle  fusion by podiatry but not until the wound heals with a proceed with this. 4/9; the patient comes in today with her original wound on the medial ankle looking satisfactory however she has some uncontrolled swelling in the middle part of her leg with 2 new open areas superiorly just lateral to the tibia. I think this was probably a wrap issue. She said she felt uncomfortable during the week but did not call in. We have been using Hydrofera Blue 4/16; the wound on the medial ankle is about the same. She has innumerable small areas superior to this across her mid tibia. I think this is probably folliculitis. She is also been working in the yard doing a lot of sweating 4/30; the patient issue on the upper areas across her mid tibia of all healed. I think this was excessive yard work if I remember. Her wound on the medial ankle is smaller. Some debris on this we have been using Hydrofera Blue under Unna boots 5/7; mid tibia. She has been using Hydrofera Blue under an Unna wrap. She is apparently going for her ankle surgery on June 3 10/28/19-Patient returns to clinic with the ankle wound, we are using Hydrofera Blue under Unna wrap, surgery is scheduled for her left foot for June 3 so she will be back for nurse visit next week READMISSION 01/17/2020 Mrs. Aseltine is a 65 year old woman we have had in this clinic for a long period of time with severe venous hypertension and refractory wounds on her medial lower legs and ankles bilaterally. 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 When she was off her legs for a prolonged period example when she fell and suffered a compression fracture things would heal up quite nicely. She is now retired and we managed to heal up the right medial leg wound. The left one was very tiny last time I saw this although still refractory. 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 It got to the point where this was interfering with her walking and she elected to proceed with a ankle arthrodesis to straighten her her ankle and leave her with a functional outcome for mobilization. The patient was referred to Dr. Doren Custard and really this took some time to arrange. Dr. Doren Custard saw her on 12/07/2019. Once again he verified that she had no arterial issues. She had previously had an angiogram several years ago. Follow-up ABIs on the left showed an ABI of 1.12 with triphasic waveforms and a TBI of 0.92. She is felt to have chronic deep venous insufficiency but I do not think it was felt that anything could be done from about this from an ablation point of view. At the time Dr. Doren Custard saw this patient the wounds actually look closed via the pictures in his clinic. The patient finally underwent her surgery on 12/15/2019. This went reasonably well and there was a good anatomic outcome. She developed a small distal wound dehiscence on the lateral part of the surgical wound. However more problematically she is developed recurrence of the wound on the medial left ankle. There are actually 2 wounds here one in the distal lower leg and 1 pretty much at the level of the medial malleolus. It is a more distal area that is more problematic. She has been using Hydrofera Blue which started on Friday before this she was simply Ace wrapping. There was a culture done that showed Pseudomonas and she is on ciprofloxacin. A recent CNS on 8/11 was negative. The patient reports some pain but I generally think this is improving. She is using a cam boot completely nonweightbearing using a walker for pivot transfers and a wheelchair 8/24; not much improvement unfortunately she has a surgical wound on the lateral part in the venous insufficiency wound medially. The bottom part of the medial insufficiency wound  is still necrotic there is exposed tendon here. We have been using Hydrofera Blue under compression. Her edema control is however better 8/31; patient in for follow-up of his surgical wound on the lateral part of her left leg and chronic venous insufficiency ulcers medially. We put her back in compression last week. She comes in today with a complaint of 3 or 4 days worth of increasing pain. She felt her cam walker was rubbing on the area on the back of her heel. However there is intense erythema seems more likely she has cellulitis. She had 2 cultures done when she was seeing podiatry in the postop. One of them in late July showed Pseudomonas and she received a course of ciprofloxacin the other was negative on 8/11 she is allergic to penicillin with anaphylactoid complaints of hives oral swelling via information in epic 9/9; when I saw this patient last week she had intense anterior erythema around her wound on the right lateral heel and ankle and also into the right medial heel. Some of this was no doubt drainage and her walker boot however I was convinced she had cellulitis. I gave her Levaquin and Bactrim she is finishing up on this now. She is following up with Dr. [name] he saw her yesterday. He is taken her out of the walking boot of course she is still nonweightbearing. Her x-ray was negative for any worrisome features such as soft tissue air etc. Things are a lot better this week. She has home health. We have been using Hydrofera Blue under an [employer] which she put back on  yesterday. I did not wrap her last week 9/17; her surrounding skin looks a lot better. In fact the area on the left lateral ankle has just a scant amount of eschar. The only remaining wound is the large area on the left medial ankle. Probably about 60% of this is healthy granulation at the surface however she has a significant divot distally. This has adherent debris in it. I been using debridement and silver collagen to  try and get this area to fill-in although I do not think we have made much progress this week 9/24; the patient's wound on the left medial ankle looks a lot better. The deeper divot area distally still requires debridement but this is cleaning up quite nicely we have been using silver collagen. The patient is complaining of swelling in her foot and is worried that that is contributing to the nonhealing of the ankle wound. She is also complaining of numbness in her anterior toes 10/4; left medial ankle. The small area distally still has a divot with necrotic material that I have been debriding away. This has an undermining area. She is approved for Apligraf. She saw Dr. [name] her surgeon on 10/1. I think he declared himself is satisfied with the condition of things. Still nonweightbearing till the end of the month. 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 Her surgical wound is well closed. There is no evidence of infection 10/11; the patient arrived in clinic today with the expectation that we be able to put an Apligraf on this area after debridement however she arrives with a relatively large amount of green drainage on the dressing. The patient states that this started on Friday. She has not been systemically unwell. 10/19; culture I did last week showed both Enterococcus and Pseudomonas. I think this came in separate parts because I stopped her ciprofloxacin I gave her and prescribed her linezolid however now looking at the final culture result this is Pseudomonas which is resistant to quinolones. She has not yet picked up the linezolid apparently phone issues. We are also trying to get a topical antibiotic out of [facility] in [place] they can be applied by home health. She is still having green drainage 10/16; the patient has her topical antibiotic from [facility] in [place]. This is a compounded gel with vancomycin and ciprofloxacin and  gentamicin. We are applying this on the wound bed with silver alginate over the top with Unna boot wraps. She arrives in clinic today with a lot less ominous looking drainage although she is only use this topical preparation once the second time today. She sees Dr. [name] her surgeon on Friday she has home health changing the dressing 11/2; still using her compounded topical antibiotic under silver alginate. Surface is cleaning up there is less drainage. We had an Apligraf for her today and I elected to apply it. A light coating of her antibiotic 04/25/2020 upon evaluation today patient appears to be doing well with regard to her ankle ulcer. There is a little bit of slough noted on the surface of the wound I am can have to perform sharp debridement to clear this away today. With that being said other than that fact overall I feel like she is making progress and we do see some new epithelial growth. There is also some improvement in the depth of the wound and that distal portion. There is little bit of slough there as well. 12/7; 2-week follow-up. Apligraf #3. Dimensions are  smaller. Closing in especially inferiorly. Still some surface debris. Still using the [facility] topical antibiotic but I told her that I don't think this needs to be renewed 12/21; 2-week follow-up. Apligraf #4 dimensions are smaller. Nice improvement 06/05/2020; 2-week follow-up. The patient's wound on the left medial ankle looks really excellent. Nice granulation. Advancing epithelialization no undermining no evidence of infection. We would have to reapply for another Apligraf but with the condition of this wound I did not feel strongly about it. We used Hydrofera Blue under the same degree of compression. She follows up with Dr. [name] her surgeon a week Friday 06/13/2020 upon evaluation today patient appears to be doing excellent in regard to her wound. She has been tolerating the dressing changes without complication. Fortunately  there is no signs of active infection at this time. No fevers, chills, nausea, vomiting, or diarrhea. She was using Hydrofera Blue last week. 06/20/2020 06/20/2020 on evaluation today patient actually appears to be doing excellent in regard to her wound. This is measuring better and looking much better as well. She has been using the collagen that seems to be doing better for her as well even though the [facility] was and is not sticking or feeling as rough on her wound. She did see Dr. [name] on Friday he is very pleased he also stated none of the hardware has shifted. That is great news 1/27; the patient has a small clean wound all that is remaining. I agree that this is too small to really consider an Apligraf. Under illumination the surface is looking quite good. We have been using collagen although the dimensions are not any better this week 2/2; the patient has a small clean wound on the left medial ankle. Although this left of her substantial original areas. Measurements are smaller. We have been using polymen Ag under an Haematologist. 2/10; small area on the left medial ankle. This looks clean nothing to debride however dimensions are about the same we have been using polymen I think now for 2 weeks 2/17; not much change in surface area. We have been using polymen Ag without any improvement. 3/17; 1 month follow-up. The patient has been using endoform without any improvement in fact I think this looks worse with more depth and more expansion 3/24; no improvement. Perhaps less debris on the surface. We have been using Sorbact for 1 week 4/4; wound measures larger. She has edema in her leg and her foot which she tells as her wrap came down. We have been using Unna boots. Sorbact of the wound. She has been approved for Apligraf 09/12/2020 upon evaluation today patient appears to be doing well with regard to her wound. We did get the Apligraf reapproved which is great news we have that available  for application today. Fortunately there is no signs of infection and overall the patient seems to be doing great. The wound bed is nice and clean. 4/27; patient presents for her second application of Apligraf. She states over the past week she has been on her feet more often due to being outside in her garden. She has noted more swelling to her foot as a result. She denies increased warmth, pain or erythema to the wound site. 10/10/2020 upon evaluation today patient appears to be doing well with regard to her wound which does not appear to be quite as irritated as last week from what I am hearing. With that being said unfortunately she is having issues with some erythema and warmth  to touch as well as an increase size. I do believe this likely is infected. 10/17/2020 upon evaluation today patient appears to be doing excellent in regard to her wound this is significantly improved as compared to last week. Fortunately I think that the infection is much better controlled at this point. She did have evidence of both Enterococcus as well as Staphylococcus noted on culture. Enterococcus really would not be helped significantly by the Cipro but the wound is doing so much better I am under the assumption that the Staphylococcus is probably the main organism that is causing the current infection. Nonetheless I think that she is doing excellent as far as that is concerned and I am very pleased in that regard. I would therefore recommend she continue with the Cipro. 10/31/2020 upon evaluation today patient appears to be doing well with regard to her wound. She has been tolerating the dressing changes without complication. Fortunately there is no signs of active infection and overall I am extremely pleased with where things stand today. No fevers, chills, nausea, vomiting, or diarrhea. With that being said she does have some green drainage coming from the wound and although it looks okay I am a little concerned about  the possibility of a continuing infection. Specifically with Pseudomonas. For that reason I will go ahead and send in a prescription for Cipro for her to be continued. 11/14/2020 upon evaluation today patient appears to be doing very well currently in regard to her wound on her leg. She has been tolerating the dressing changes without complication. Fortunately I feel like the infection is finally under good control here. Unfortunately we do not have the Apligraf for application today although we can definitely order to have it in place for next week. That will be her fifth and final of the current series. Nonetheless I feel like her wound is really doing quite well which is great news. 11/21/2020 upon evaluation today patient appears to be doing well with regard to her wound on the medial ankle. Fortunately I think the infection is under control and I do believe we can go ahead and reapply the Apligraf today. She is in agreement with that plan. There does not appear to be any signs of active infection at this time which is great news. No fevers, chills, nausea, vomiting, or diarrhea. 12/05/2020 upon evaluation today patient's wound bed actually showed signs of good granulation epithelization at this point. There does not appear to be any signs of infection which is great news and overall very pleased with where things stand. Overall the patient seems to be doing fairly well in my opinion with regard to her wound although I do believe she continues to build up a lot of biofilm I think she could benefit from using PuraPly at this point. 12/12/2020 upon evaluation today patient's wound actually appears to be doing decently well today. The Unna boot has not been quite as well-tolerated so that more uncomfortable for her and even causing some pressure over the plate on the lateral portion of her foot which is 90 where the wound is. There did not appear to be any significant deep tissue injury with that there may  be a minimal change in the skin noted I think that we may want to go back to the Coflex 2 layer which is a little bit easier on her skin it seems. 12/19/2020 upon evaluation today patient actually seems to be making great progress with the PuraPly currently. She in fact seems  to be much better as far as the overall appearance of the wound bed is concerned I am very happy in this regard. I do not see any signs of of infection which is great news as well. No fevers, chills, nausea, vomiting, or diarrhea. 12/26/2020 upon evaluation today patient appears to actually be doing better in regard to her wound on the left medial ankle region. The surface of the wound is actually doing significantly better which is great news. There does not appear to be any signs of infection which is also great news and in general I am extremely pleased with where we stand today. 01/02/2021 upon evaluation today patient appears to be doing well with regard to her wound. In fact this is showing signs of excellent improvement and very pleased with where things stand. In fact the last 3 appointments have all shown signs of this getting smaller which is excellent news. I have not even had to perform any debridement and today is no exception. Overall I feel like this is dramatically improved compared to previous. T oday is PuraPly application #4. [id number] upon evaluation today patient appears to be doing excellent in regard to her wound this is continue to show signs of improvement and overall I am extremely pleased with where we stand today. She is actually here for PuraPly application #5. Every time we have applied this she is noted definite improvement on measurements. 01/23/2021 upon evaluation today patient is actually making good progress in regard to her wound. This was actually on just a little bit longer this time compared to previous due to the fact that she did have to go out of town. She is actually here for PuraPly  application #6. We have definitely been seeing improvements in the overall quality of the tissue on the surface of the wound which is awesome news. In general I think that the patient seems to be continuing to make great progress here. 01/30/2021 upon evaluation today patient's wound is actually doing excellent. There is really not any significant biofilm buildup which is great news and overall I am extremely pleased with where things stand today. There does not appear to be any signs of active infection. No fevers, chills, nausea, vomiting, or diarrhea. 02/06/2021 upon evaluation today patient's wound is actually showing signs of excellent improvement which is great news. We continue to see the benefit of the PuraPly this is doing a great job the wound seems not really irritated whatsoever and is showing signs of good granulation at this point. Overall I am extremely happy with what we are seeing. The patient likewise is happy to hear all of this as well. 02/13/2021 upon evaluation today patient appears to be doing well with regard to her wound. This again is measuring smaller today and I am very pleased in this regard. Fortunately there does not appear to be any signs of active infection at this time which is good news from a systemic standpoint. Locally there is still some significant drainage which she does have is concerned about infection locally. No fevers, chills, nausea, vomiting, or diarrhea. 02/20/2021 upon evaluation today patient actually appears to be doing decently well in regard to her wound. She has been tolerating the dressing changes without complication. I do believe the PuraPly is helping wound bed does appear to be doing better. There is no evidence of active infection locally or systemically at this point visually although on fluorescence imaging there still appear to be bacterial load and bioburden noted. 02/27/2021  upon evaluation today patient fortunately continues to show signs of  improvement with use of the PuraPly currently. Subsequently we did review her culture results and to be honest I think that the Bactrim is probably the best option to have her continue at this point. For that reason I am get a go ahead and send in a refill today for her for an additional 2 weeks. Nonetheless I think that we are making excellent progress here. It was Enterococcus and Staphylococcus that were noted she is allergic to penicillin so there is not much I can do from the Enterococcus standpoint though the staff does seem to be sensitive to the Bactrim. 03/06/2021 upon evaluation today patient appears to be doing well with regard to her wound. This is definitely showing signs of improvement which is great news and overall I am extremely pleased with where things stand today. There does not appear to be any signs of active infection which is great news and overall and I do believe that we are headed in the appropriate direction I think the PuraPly is doing an awesome job for her. Electronic Signature(s) Signed: 03/06/2021 1:44:19 PM By: [name] PA-C Entered By: [name] on 03/06/2021 13:44:19 -------------------------------------------------------------------------------- Physical Exam Details Patient Name: Date of Service: [name], [name] 03/06/2021 10:00 A M Medical Record Number: [id number] Patient Account Number: [id number] Date of Birth/Sex: Treating RN: 10-10-1955 (65 y.o. [name] Primary Care Provider: [name] Other Clinician: Referring Provider: Treating Provider/Extender: [name] in Treatment: 36 Constitutional Well-nourished and well-hydrated in no acute distress. Respiratory normal breathing without difficulty. Psychiatric this patient is able to make decisions and demonstrates good insight into disease process. Alert and Oriented x 3. pleasant and cooperative. Notes Upon inspection patient's wound bed did  require some slight sharp debridement to clear away some of the necrotic debris she tolerated that debridement today without complication and postdebridement wound bed appears to be doing much better which is great news. I then did proceed to apply the PuraPly today and she tolerated that application without complication we used Adaptic to cover. Electronic Signature(s) Signed: 03/06/2021 1:44:45 PM By: [name] PA-C Entered By: [name] on 03/06/2021 13:44:45 -------------------------------------------------------------------------------- Physician Orders Details Patient Name: Date of Service: [name], [name] 03/06/2021 10:00 A M Medical Record Number: [id number] Patient Account Number: [id number] Date of Birth/Sex: Treating RN: 12/22/1955 (65 y.o. [name], [name] Primary Care Provider: [name] Other Clinician: Referring Provider: Treating Provider/Extender: [name] in Treatment: 30 Verbal / Phone Orders: No Diagnosis Coding ICD-10 Coding Code Description [phone] Chronic venous hypertension (idiopathic) with ulcer and inflammation of left lower extremity L97.828 Non-pressure chronic ulcer of other part of left lower leg with other specified severity L97.328 Non-pressure chronic ulcer of left ankle with other specified severity Follow-up Appointments ppointment in 2 weeks. Margarita Grizzle Return A Nurse Visit: - Nurse visit next week 03/13/2021 ****Use Endoform as primary dressing at nurse visit.**** Cellular or Tissue Based Products Cellular or Tissue Based Product Type: - puraply AM#12 daptic or Mepitel. (DO NOT REMOVE). - no Cellular or Tissue Based Product applied to wound bed, secured with steri-strips, cover with A steristrips Bathing/ Shower/ Hygiene May shower with protection but do not get wound dressing(s) wet. Edema Control - Lymphedema / SCD / Other Elevate legs to the level of the heart or above for 30 minutes daily and/or  when sitting, a frequency  of: - throughout the day Avoid standing for long periods of time. Exercise regularly Compression stocking or Garment 20-30 mm/Hg pressure to: - right leg daily Additional Orders / Instructions Follow Nutritious Diet Wound Treatment Wound #15 - Malleolus Wound Laterality: Left, Medial Cleanser: Soap and Water 1 x Per Week/30 Days Discharge Instructions: May shower and wash wound with dial antibacterial soap and water prior to dressing change. Peri-Wound Care: Triamcinolone 15 (g) 1 x Per Week/30 Days Discharge Instructions: Use triamcinolone 15 (g) as directed Peri-Wound Care: Sween Lotion (Moisturizing lotion) 1 x Per Week/30 Days Discharge Instructions: Apply moisturizing lotion as directed Prim Dressing: PuraPly AM 1 x Per Week/30 Days ary Discharge Instructions: applied by provider covered with adaptic only. *****At nurse visit on 03/13/2021 use ENDOFORM AS PRIMARY*** Secondary Dressing: Woven Gauze Sponge, Non-Sterile 4x4 in 1 x Per Week/30 Days Discharge Instructions: to bolster skin sub Secondary Dressing: ABD Pad, 5x9 1 x Per Week/30 Days Discharge Instructions: Apply over primary dressing and lateral ankle Secondary Dressing: Optifoam Non-Adhesive Dressing, 4x4 in 1 x Per Week/30 Days Discharge Instructions: Apply over primary dressing pad lateral foot with foam donut Secondary Dressing: ADAPTIC TOUCH 3x4.25 in 1 x Per Week/30 Days Discharge Instructions: Apply over skin sub Compression Wrap: CoFlex TLC XL 2-layer Compression System 4x7 (in/yd) 1 x Per Week/30 Days Discharge Instructions: Apply CoFlex 2-layer compression as directed. (alt for 4 layer) Electronic Signature(s) Signed: 03/07/2021 6:25:33 PM By: Deon Pilling RN, BSN Signed: 03/08/2021 9:30:20 AM By: [name] PA-C Entered By: Deon Pilling on 03/06/2021 11:13:10 -------------------------------------------------------------------------------- Problem List Details Patient Name: Date  of Service: [name], [name] 03/06/2021 10:00 A M Medical Record Number: [id number] Patient Account Number: [id number] Date of Birth/Sex: Treating RN: 1955/08/10 (65 y.o. 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 01/17/2020 No Yes Inactive Problems ICD-10 Code Description Active Date Inactive Date L03.116 Cellulitis of left lower limb 01/31/2020 01/31/2020 T81.31XD Disruption of external operation (surgical) wound, not elsewhere classified, subsequent 01/17/2020 01/17/2020 encounter Resolved Problems Electronic Signature(s) Signed: 03/06/2021 10:42:47 AM By: [name] PA-C Entered By: [name] on 03/06/2021 10:42:47 -------------------------------------------------------------------------------- Progress Note Details Patient Name: Date of Service: [facility] [name], [name] 03/06/2021 10:00 A M Medical Record Number: [id number] Patient Account Number: [id number] Date of Birth/Sex: Treating RN: 04/11/1956 (65 y.o. [name] Primary Care Provider: [name] Other Clinician: Referring Provider: Treating Provider/Extender: [name] in Treatment: 79 Subjective Chief Complaint Information obtained from Patient patient is been followed long-term in this clinic for venous insufficiency ulcers with inflammation, hypertension and ulceration over the medial ankle bilaterally. 01/17/2020; this is a patient who is here for review of  postoperative wounds on the left lateral ankle and recurrence of venous stasis ulceration on the left medial History of Present Illness (HPI) the remaining wound is over the left medial ankle. Similar wound over the right medial ankle healed largely with use of Apligraf. Most recently we have been using Hydrofera Blue over this wound with considerable improvement. The patient has been extensively worked up in  the past for her venous insufficiency and she is not a candidate for antireflux surgery although I have none of the details available currently. 08/24/14; considerable improvement today. About 50% of this wound areas now epithelialized. The base of the wound appears to be healthier granulation.as opposed to last week when she had deteriorated a considerable improvement 08/17/14; unfortunately the wound has regressed somewhat. The areas of epithelialization from the superior aspect are not nearly as healthy as they were last week. The patient thinks her Hydrofera Blue slipped. 09/07/14; unfortunately the area has markedly regressed in the 2 weeks since I've seen this. There is an odor surrounding erythema. The healthy granulation tissue that we had at the base of the wound now is a dusky color. The nurse reports green drainage 09/14/14; the area looks somewhat better than last week. There is less erythema and less drainage. The culture I did did not show any growth. Nevertheless I think it is better to continue the Cipro and doxycycline for a further week. The remaining wound area was debridement. 09/21/14. Wound did not require debridement last week. Still less erythema and less drainage. She can complete her antibiotics. The areas of epithelialization in the superior aspect of the wound do not look as healthy as they did some weeks ago 10/05/14 continued improvement in the condition of this wound. There is advancing epithelialization. Less aggressive debridement required 10/19/14 continued improvement  in the condition and volume of this wound. Less aggressive debridement to the inferior part of this to remove surface slough and fibrinous eschar 11/02/14 no debridement is required. The surface granulation appears healthy although some of her islands of epithelialization seem to have regressed. No evidence of infection 11/16/14; lites surface debridement done of surface eschar. The wound does not look to be unhealthy. No evidence of infection. Unfortunately the patient has had podiatry issues in the right foot and for some reason has redeveloped small surface ulcerations in the medial right ankle. Her original presentation involved wounds in this area 11/23/14 no debridement. The area on the right ankle has enlarged. The left ankle wound appears stable in terms of the surface although there is periwound inflammation. There has been regression in the amount of new skin 11/30/14 no debridement. Both wound areas appear healthy. There was no evidence of infection. The the new area on the right medial ankle has enlarged although that both the surfaces appear to be stable. 12/07/14; Debridement of the right medial ankle wound. No no debridement was done on the left. 12/14/14 no major change in and now bilateral medial ankle wounds. Both of these are very painful but the no overt evidence of infection. She has had previous venous ablation 12/21/14; patient states that her right medial ankle wound is considerably more painful last week than usual. Her left is also somewhat painful. She could not tolerate debridement. The right medial ankle wound has fibrinous surface eschar 12/28/14 this is a patient with severe bilateral venous insufficiency ulcers. For a considerable period of time we actually had the one on the right medial ankle healed however this recently opened up again in June. The left medial ankle wound has been a refractory area with some absent flows. We had some success with Hydrofera Blue on this area  and it literally closed by 50% however it is recently opened up Foley. Both of these were debridement today of surface eschar. She tolerates this poorly 01/25/15: No change in the status of this. Thick adherent escar. Very poor tolerance of  any attempt at debridement. I had healed the right medial malleolus wound for a considerable amount of time and had the left one down to about 50% of the volume although this is totally regressed over the last 48 weeks. Further the right leg has reopened. she is trying to make a appointment with pain and vascular, previous ablations with Dr. Aleda Grana. I do not believe there is an arterial insufficiency issue here 02/01/15 the status of the adherent eschar bilaterally is actually improved. No debridement was done. She did not manage to get vascular studies done 02/08/15 continued debridement of the area was done today. The slough is less adherent and comes off with less pressure. There is no surrounding infection peripheral pulses are intact 02/15/15 selective debridement with a disposable curette. Again the slough is less adherent and comes off with less difficulty. No surrounding infection peripheral pulses are intact. 02/22/15 selective debridement of the right medial ankle wound. Slough comes off with less difficulty. No obvious surrounding infection peripheral pulses are intact I did not debridement the one on the left. Both of these are stable to improved 03/01/15 selective debridement of both wound areas using a curette to. Adherent slough cup soft with less difficulty. No obvious surrounding infection. The patient tells me that 2 days ago she noted a rash above the right leg wrap. She did not have this on her lower legs when she change this over she arrives with widespread left greater than right almost folliculitis-looking rash which is extremely pruritic. I don't see anything to culture here. There is no rash on the rest of her body. She feels well  systemically. 03/08/15; selective debridement of both wounds using a curette. Base of this does not look unhealthy. She had limegreen drainage coming out of the left leg wound and describes a lot of drainage. The rash on her left leg looks improved to. No cultures were done. 03/22/15; patient was not here last week. Basal wounds does not look healthy and there is no surrounding erythema. No drainage. There is still a rash on the left leg that almost looks vasculitic however it is clearly limited to the top of where the wrap would be. 04/05/15; on the right required a surgical debridement of surface eschar and necrotic subcutaneous tissue. I did not debridement the area on the left. These continue to be large open wounds that are not changing that much. We were successful at one point in healing the area on the right, and at the same time the area on the left was roughly half the size of current measurements. I think a lot of the deterioration has to do with the prolonged time the patient is on her feet at work 04/19/15 I attempted-like surface debridement bilaterally she does not tolerate this. She tells me that she was in allergic care yesterday with extreme pain over her left lateral malleolus/ankle and was told that she has an "sprain" 05/03/15; large bilateral venous insufficiency wounds over the medial malleolus/medial aspect of her ankles. She complains of copious amounts of drainage and his usual large amounts of pain. There is some increasing erythema around the wound on the right extending into the medial aspect of her foot to. historically she came in with these wounds the right one healed and the left one came down to roughly half its current size however the right one is reopened and the left is expanded. 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 05/10/15 large bilateral venous insufficiency wounds. There is less adherence surface left  however the surface culture that I did last week grew pseudomonas therefore bilateral selective debridement score necessary. There is surrounding erythema. The patient describes severe bilateral drainage and a lot of pain in the left ankle. Apparently her podiatrist was were ready to do a cortisone shot 05/17/15; the patient complains of pain and again copious amounts of drainage. 05/24/15; we used Iodo flex last week. Patient notes considerable improvement in wound drainage. Only needed to change this once. 05/31/15; we continued Iodoflex; the base of these large wounds bilaterally is not too bad but there is probably likely a significant bioburden here. I would like to debridement just doesn't tolerate it. 06/06/14 I would like to continue the Iodoflex although she still hasn't managed to obtain supplies. She has bilateral medial malleoli or large wounds which are mostly superficial. Both of them are covered circumferentially with some nonviable fibrinous slough although she tolerates debridement very poorly. She apparently has an appointment for an ablation on the right leg by interventional radiology. 06/14/15; the patient arrives with the wounds and static condition. We attempted a debridement although she does not do well with this secondary to pain. I 07/05/15; wounds are not much smaller however there appears to be a cleaner granulating base. The left has tight fibrinous slough greater than the right. Debridement is tolerated poorly due to pain. Iodoflex is done more for these wounds in any of the multitude of different dressings I have tried on the left 1 and then subsequently the right. 07/12/15; no change in the condition of this wound. I am able to do an aggressive debridement on the right but not the left. She simply cannot tolerate it. We have been using Iodoflex which helps somewhat. It is worthwhile remembering that at one point we healed the right medial ankle wound and the left was  about 25% of the current circumference. We have suggested returning to vascular surgery for review of possible further ablations for one reason or another she has not been able to do this. 07/26/15 no major change in the condition of either wound on her medial ankle. I did not attempt to debridement of these. She has been aggressively scrubbing these while she is in the shower at home. She has her supply of Iodoflex which seems to have done more for these wounds then anything I have put on recently. 08/09/15 wound area appears larger although not verified by measurements. Using Iodoflex 09/05/2015 -- she was here for avisit today but had significant problems with the wound and I was asked to see her for a physician opinion. I have summarize that this lady has had surgery on her left lower extremity about 10 years ago where the possible veins stripping was done. She has had an opinion from interventional radiology around November 2016 where no further sclerotherapy was ordered. The patient works 12 hours a day and stands on a concrete floor with work boots and is unable to get the proper compression she requires and cannot elevate her limbs appropriately at any given time. She has recently grown Pseudomonas from her wound culture but has not started her ciprofloxacin which was called in for her. 09/13/15 this continues to be a difficult situation for this patient. At one point I had this wound down to a 1.5 x 1.5" wound on her left leg. This is deteriorated and the right leg has reopened. She now has  substantial wounds on her medial calcaneus, malleoli and into her lower leg. One on the left has surface eschar but these are far too painful for me to debridement here. She has a vascular surgery appointment next week to see if anything can be done to help here. I think she has had previous ablations several years ago at [facility]. She has no major edema. She tells me that she did not get product last  time [facility] Ag] and went for several days without it. She continues to work in work boots 12 hours a day. She cannot get compression/4-layer under her work boots. 09/20/15 no major change. Periwound edema control was not very good. Her point with pain and vascular is next Wednesday the 25th 09/28/15; the patient is seen vascular surgery and is apparently scheduled for repeat duplex ultrasounds of her bilateral lower legs next week. 10/05/15; the patient was seen by Dr. Doren Custard of vascular surgery. He feels that she should have arterial insufficiency excluded as cause/contributed to her nonhealing stage she is therefore booked for an arteriogram. She has apparently monophasic signals in the dorsalis pedis pulses. She also of course has known severe chronic venous insufficiency with previous procedures as noted previously. I had another long discussion with the patient today about her continuing to work 12 hour shifts. I've written her out for 2 months area had concerns about this as her work location is currently undergoing significant turmoil and this may lead to her termination. She is aware of this however I agree with her that she simply cannot continue to stand for 12 hours multiple days a week with the substantial wound areas she has. 10/19/15; the Dr. Doren Custard appointment was largely for an arteriogram which was normal. She does not have an arterial issue. He didn't make a comment about her chronic venous insufficiency for which she has had previous ablations. Presumably it was not felt that anything additional could be done. The patient is now out of work as I prescribed 2 weeks ago. Her wounds look somewhat less aggravated presumably because of this. I felt I would give debridement another try today 10/25/15; no major change in this patient's wounds. We are struggling to get her product that she can afford into her own home through her insurance. 11/01/15; no major change in the patient's wounds. I have  been using silver alginate as the most affordable product. I spoke to Dr. Marla Roe last week with her requested take her to the OR for surgical debridement and placement of ACEL. Dr. Marla Roe told me that she would be willing to do this however [facility] will not cover this, fortunately the patient has [place] healthcare of some variant 11/08/15; no major change in the patient's wounds. She has been completely nonviable surface that this but is in too much pain with any attempted debridement are clinic. I have arranged for her to see Dr. Marla Roe ham of plastic surgery and this appointment is on Monday. I am hopeful that they will take her to the OR for debridement, possible ACEL ultimately possible skin graft 11/22/15 no major change in the patient's wounds over her bilateral medial calcaneus medial malleolus into the lower legs. Surface on these does not look too bad however on the left there is surrounding erythema and tenderness. This may be cellulitis or could him sleepy tinea. 11/29/15; no major changes in the patient's wounds over her bilateral medial malleolus. There is no infection here and I don't think any additional antibiotics  are necessary. There is now plan to move forward. She sees Dr. Marla Roe in a week's time for preparation for operative debridement and ACEL placement I believe on 7/12. She then has a follow-up appointment with Dr. Marla Roe on 7/21 12/28/15; the patient returns today having been taken to the [place] by Dr. Marla Roe 12/12/15 she underwent debridement, intraoperative cultures [which were negative]. She had placement of a wound VAC. Parent really ACEL was not available to be placed. The wound VAC foam apparently adhered to the wound since then she's been using silver alginate, Xeroform under Ace wraps. She still says there is a lot of drainage and a lot of pain 01/31/16; this is a patient I see monthly. I had referred her to Dr. Marla Roe him of plastic  surgery for large wounds on her bilateral medial ankles. She has been to the OR twice once in early July and once in early August. She tells me over the last 3 weeks she has been using the wound VAC with ACEL underneath it. On the right we've simply been using silver alginate. Under Kerlix Coban wraps. 02/28/16; this is a patient I'm currently seeing monthly. She is gone on to have a skin graft over her large venous insufficiency ulcer on the left medial ankle. This was done by Dr. Marla Roe him. The patient is a bit perturbed about why she didn't have one on her right medial ankle wound. She has been using silver alginate to this. 03/06/16; I received a phone call from her plastic surgery Dr. Marla Roe. She expressed some concern about the viability of the skin graft she did on the left medial ankle wound. Asked me to place Endoform on this. She told me she is not planning to do a subsequent skin graft on the right as the left one did not take very well. I had placed Hydrofera Blue on the right 03/13/16; continue to have a reasonably healthy wound on the right medial ankle. Down to 3 mm in terms of size. There is epithelialization here. The area on the left medial ankle is her skin graft site. I suppose the last week this looks somewhat better. She has an open area inferiorly however in the center there appears to be some viable tissue. There is a lot of surface callus and eschar that will eventually need to come off however none of this looked to be infected. Patient states that the is able to keep the dressing on for several days which is an improvement. 03/20/16 no major change in the circumference of either wound however on the left side the patient was at Dr. Eusebio Friendly office and they did a debridement of left wound. 50% of the wound seems to be epithelialized. I been using Endoform on the left Hydrofera Blue in the right 03/27/16; she arrives today with her wound is not looking as healthy as  they did last week. The area on the right clearly has an adherent surface to this a very similar surface on the left. Unfortunately for this patient this is all too familiar problem. Clearly the Endoform is not working and will need to change that today that has some potential to help this surface. She does not tolerate debridement in this clinic very well. She is changing the dressing wants 04/03/16; patient arrives with the wounds looking somewhat better especially on the right. Dr. Migdalia Dk change the dressing to silver alginate when she saw her on Monday and also sold her some compression socks. The usefulness of the latter  is really not clear and woman with severely draining wounds. 04/10/16; the patient is doing a bit of an experiment wearing the compression stockings that Dr. Migdalia Dk provided her to her left leg and the out of legs based dressings that we provided to the right. 05/01/16; the patient is continuing to wear compression stockings Dr. Migdalia Dk provided her on the left that are apparently silver impregnated. She has been using Iodoflex to the right leg wound. Still a moderate amount of drainage, when she leaves here the wraps only last for 4 days. She has to change the stocking on the left leg every night 05/15/16; she is now using compression stockings bilaterally provided by Dr. Marla Roe. She is wearing a nonadherent layer over the wounds so really I don't think there is anything specific being done to this now. She has some reduction on the left wound. The right is stable. I think all healing here is being done without a specific dressing 06/09/16; patient arrives here today with not much change in the wound certainly in diameter to large circular wounds over the medial aspect of her ankle bilaterally. Under the light of these services are certainly not viable for healing. There is no evidence of surrounding infection. She is wearing compression stockings with some sort of silver  impregnation as prescribed by Dr. Marla Roe. She has a follow-up with her tomorrow. 06/30/16; no major change in the size or condition of her wounds. These are still probably covered with a nonviable surface. She is using only her purchase stockings. She did see Dr. Marla Roe who seemed to want to apply Dakin's solution to this I'm not extreme short what value this would be. I would suggest Iodoflex which she still has at home. 07/28/16; I follow Mrs. Bolan episodically along with Dr. Marla Roe. 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 She has been applying a topical collagen ointment to both wounds with Adaptic. I don't think Dr. Marla Roe is planning to take her back to the OR. 08/19/16; I follow Mrs. Jeneen Rinks on a monthly basis along with Dr. Marla Roe of plastic surgery. 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 I been following her for a number of years. At one point I was able to get the right medial malleolus wound to heal and had the left medial malleolus down to about half its current size however and I had to send her to plastic surgery for an operative debridement. Since then things have been stable to slightly improve the area on the right is slightly better one in the left about the same although there is much less adherent surface than I'm used to with this patient. She is using some form of liquid collagen gel that Dr. Marla Roe provided a Kerlix cover with the patient's own pressure stockings. She tells me that she has extreme pain in both ankles and along the lateral aspect of both feet. She has been unable to work for some period of time. She is telling me she is retiring at the beginning of April. She sees Dr. Doran Durand of orthopedics next week 09/22/16; patient has not seen Dr. Marla Roe since the last time she is here. I'm not really sure what she is  using to the wounds other than bits and pieces of think she had left over including most recently Hydrofera Blue. She is using juxtalite stockings. She is having difficulty with her husband's recent illness "  stroke". She is having to transport him to various doctors appointments. Dr. Marla Roe left her the option of a repeat debridement with ACEL however she has not been able to get the time to follow-up on this. She continues to have a fair amount of drainage out of these wounds with certainly precludes leaving dressings on all week 10/13/16; patient has not seen Dr. Marla Roe since she was last in our clinic. I'm not really sure what she is doing with the wounds, we did try to get her [facility] and I think she is actually using this most of the time. Because of drainage she states she has to change this every second day although this is an improvement from what she used to do. She went to see Dr. Doran Durand who did not think she had a muscular issue with regards to her feet, he referred her to a neurologist and I think the appointment is sometime in June. I changed her back to Iodoflex which she has used in the past but not recently. 11/03/16; the patient has been using Iodoflex although she ran out of this. Still claims that there is a lot of drainage although the wound does not look like this. No surrounding erythema. She has not been back to see Dr. Marla Roe 11/24/16; the patient has been using Iodoflex again but she ran out of it 2 or 3 days ago. There is no major change in the condition of either one of these wounds in fact they are larger and covered in a thick adherent surface slough/nonviable tissue especially on the left. She does not tolerate mechanical debridement in our clinic. Going back to see Dr. Marla Roe of plastic surgery for an operative debridement would seem reasonable. 12/15/16; the patient has not been back to see Dr. Marla Roe. She is been dealing with a series of illnesses  and her husband which of monopolized her time. She is been using Sorbact which we largely supplied. She states the drainage is bad enough that it maximum she can go 2-3 days without changing the dressing 01/12/2017 -- the patient has not been back for about 4 weeks and has not seen Dr. Marla Roe not does she have any appointment pending. 01/23/17; patient has not seen Dr. Marla Roe even though I suggested this previously. She is using Santyl that was suggested last week by Dr. Con Memos this Cost her $16 through her insurance which is indeed surprising 02/12/17; continuing Santyl and the patient is changing this daily. A lot of drainage. She has not been back to see plastic surgery she is using an Ace wrap. Our intake nurse suggested wrap around stockings which would make a good reasonable alternative 02/26/17; patient is been using Santyl and changing this daily due to drainage. She has not been to see plastic surgery she uses in April Ace wrap to control the edema. She did obtain extremitease stockings but stated that the edema in her leg was to big for these 03/20/17; patient is using Santyl and Anasept. Surfaces looked better today the area on the right is actually measuring a little smaller. She has states she has a lot of pain in her feet and ankles and is asking for a consult to pain control which I'll try to help her with through our case manager. 04/10/17; the patient arrives with better-looking wound surfaces and is slightly smaller wound on the left she is using a combination of Santyl and Anasept. She has an appointment or at least as started in the pain control  center associated with [facility] 05/14/17; this is a patient who I followed for a prolonged period of time. She has venous insufficiency ulcers on her bilateral medial ankles. At one point I had this down to a much smaller wound on the left however these reopened and we've never been able to get these to heal. She has been using  Santyl and Anasept gel although 2 weeks ago she ran out of the Anasept gel. She has a stable appearance of the wound. She is going to the wound care clinic at [facility]. They wanted do a nerve block/spinal block although she tells me she is reluctant to go forward with that. 05/21/17; this is a patient I have followed for many years. She has venous insufficiency ulcers on her bilateral medial ankles. Chronic pain and deformity in her ankles as well. She is been to see plastic surgery as well as orthopedics. Using PolyMem AG most recently/Kerramax/ABDs and 2 layer compression. She has managed to keep this on and she is coming in for a nurse check to change the dressing on Tuesdays, we see her on Fridays 06/05/17; really quite a good looking surface and the area especially on the right medial has contracted in terms of dimensions. Well granulated healthy-looking tissue on both sides. Even with an open curet there is nothing that even feels abnormal here. This is as good as I've seen this in quite some time. We have been using PolyMem AG and bringing her in for a nurse check 06/12/17; really quite good surface on both of these wounds. The right medial has contracted a bit left is not. We've been using PolyMem and AG and she is coming in for a nurse visit 06/19/17; we have been using PolyMem AG and bringing her in for a nurse check. Dimensions of her wounds are not better but the surfaces looked better bilaterally. She complained of bleeding last night and the left wound and increasing pain bilaterally. She states her wound pain is more neuropathic than just the wounds. There was some suggestion that this was radicular from her pain management doctor in talking to her it is really difficult to sort this out. 06/26/17; using PolyMem and AG and bringing her in for a nurse check as All of this and reasonably stable condition. Certainly not improved. The dimensions on the lateral part of the right leg look  better but not really measuring better. The medial aspect on the left is about the same. 07/03/16; we have been using PolyMen AG and bringing her in for a nurse check to change the dressings as the wounds have drainage which precludes once weekly changing. We are using all secondary absorptive dressings.our intake nurse is brought up the idea of using a wound VAC/snap VAC on the wound to help with the drainage to see if this would result in some contraction. This is not a bad idea. The area on the right medial is actually looking smaller. Both wounds have a reasonable-looking surface. There is no evidence of cellulitis. The edema is well controlled 07/10/17; the patient was denied for a snap VAC by her insurance. The major issue with these wounds continues to be drainage. We are using wicked PolyMem AG and she is coming in for a nurse visit to change this. The wounds are stable to slightly improved. The surface looks vibrant and the area on the right certainly has shrunk in size but very slowly 07/17/17; the patient still has large wounds on her bilateral medial  malleoli. Surface of both of these wounds looks better. The dimensions seem to come and go but no consistent improvement. There is no epithelialization. We do not have options for advanced treatment products due to insurance issues. They did not approve of the wound VAC to help control the drainage. More recently we've been using PolyMem and AG wicked to allow drainage through. We have been bringing her in for a nurse visit to change this. We do not have a lot of options for wound care products and the home again due to insurance issues 07/24/17; the patient's wound actually looks somewhat better today. No drainage measurements are smaller still healthy-looking surface. We used silver collagen under PolyMen started last week. We have been bringing her in for a dressing change 07/31/17; patient's wound surface continued to look better and I think there  is visible change in the dimensions of the wound on the right. Rims of epithelialization. We have been using silver collagen under PolyMen and bringing her in for a dressing change. There appears to be less drainage although she is still in need of the dressing change 08/07/17. Patient's wound surface continues to look better on both sides and the area on the right is definitely smaller. We have been using silver collagen and PolyMen. She feels that the drainage has been it has been better. I asked her about her vascular status. She went to see Dr. Aleda Grana at [facility] and had some form of ablation. I don't have much detail on this. I haven't my notes from 2016 that she was not a candidate for any further ablation but I don't have any more information on this. We had referred her to vein and vascular I don't think she ever went. He does not have a history of PAD although I don't have any information on this either. We don't even have ABIs in our record 08/14/17; we've been using silver collagen and PolyMen cover. And putting the patient and compression. She we are bringing her in as a nurse visit to change this because ofarge amount of drainage. We didn't the ABIs in clinic today since they had been done in many moons 1.2 bilaterally. She has been to see vein and vascular however this was at [facility] and she had ablation although I really don't have any information on this all seemed biking get a report. She is also been operatively debrided by plastic surgery and had a cell placed probably 8-12 months ago. This didn't have a major effect. We've been making some gains with current dressings 08/19/17-She is here in follow-up evaluation for bilateral medial malleoli ulcers. She continues to tolerate debridement very poorly. We will continue with recently changed topical treatment; if no significant improvement may consider switching to Iodosorb/Iodoflex. 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 She has been using silver collagen and PolyMem. I believe she has been used and tried on Iodoflex before. 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 These have been very refractory over time. We have been using silver collagen and PolyMen. She is coming in once a week for a doctor's and once a week for nurse visits. We are actually making some progress 09/18/17; the patient's wounds are smaller especially on the right medial. She arrives today to upset  to consider even washing these off with Anasept which I think is been part of the reason this is been closing. We've been using collagen covered in PolyMen otherwise. It is noted that she has a small area of folliculitis on the right medial calf that. As we are wrapping her legs I'll give her a short course of doxycycline to make sure this doesn't amount to anything. She is a long list of complaints today including imbalance, shortness of breath on exertion, inversion of her left ankle. With regards to the latter complaints she is been to see orthopedics and they offered her a tendon release surgery I believe but wanted her wounds to be closed first. I have recommended she go see her primary physician with regards to everything else. 09/25/17; patient's wounds are about the same size. We have made some progress bilaterally although not in recent weeks. She will not allow me T wash these wounds with Anasept even if she is doing her cell. Wheeze we've been using collagen covered in PolyMen. Last week she had a small area of folliculitis this is now opened into a small wound. She completed 5 days of trimethoprim sulfamethoxazole 10/02/17; unfortunately the area on her left medial ankle is worse with a larger wound area  towards the Achilles. The patient complains of a lot of pain. She will not allow debridement although visually I don't think there is anything to debridement in any case. We have been using silver collagen and PolyMen for several months now. Initially we are making some progress although I'm not really seeing that today. We will move back to [facility] His admittedly this is a bit of a repeat however I'm hoping that his situation is different now. The patient tells me she had her leg on the left give out on her yesterday this is process some pain. 10/09/17; the patient is seen twice a week largely because of drainage issues coming out of the chronic medial bimalleolar wounds that are chronic. Last week the dimensions of the one on the left looks a little larger I changed her to [facility]. She comes in today with a history of terrible pain in the bilateral wound areas. She will not allow debridement. She will not even allow a tissue culture. There is no surrounding erythema no no evidence of cellulitis. We have been putting her Kerlix Coban man. She will not allow more aggressive compression as there was a suggestion to put her in 3 layer wraps. 10/16/17; large wounds on her bilateral medial malleoli. These are chronic. Not much change from last week. The surface looks have healthy but absolutely no epithelialization. A lot of pain little less so of drainage. She will not allow debridement or even washing these off in the vigorous fashion with Anasept. 10/23/17; large wounds on her bilateral malleoli which are chronic. Some improvement in terms of size perhaps on the right since last time I saw these. She states that after we increased the 3 layer compression there was some bleeding, when she came in for a nurse visit she did not want 3 layer compression put back on about our nurse managed to convince her. She has known chronic venous visit issues and I'm hoping to get her to tolerate the 3 layer  compression. using Hydrofera Blue 10/30/17; absolutely no change in the condition of either wound although we've had some improvement in dimensions on the right.. Attempted to put her in 3 layer compression she didn't tolerated she is back  in 2 layer compression. We've been using Hydrofera Blue We looked over her past records. She had venous reflux studies in November 2016. There was no evidence of deep venous reflux on the right. Superficial vein did not show the greater saphenous vein at think this is been previously ablated the small saphenous vein was within normal limits. The left deep venous system showed no DVT the vessels were positive for deep venous reflux in the posterior tibial veins at the ankle. The greater saphenous vein was surgically absent small saphenous vein was within normal limits. She went to vein and vascular at [facility]. I believe she had an ablation on the left greater saphenous vein. I'll update her reflux studies perhaps ever reviewed by vein and vascular. We've made absolutely no progress in these wounds. Will also try to read and TheraSkins through her insurance 11/06/17; W the patient apparently has a 2 week follow-up with vein and vascular I like him to review the whole issue with regards to her previous vascular workup by Dr. Aleda Grana. We've really made no progress on these wounds in many months. She arrives today with less viable looking surface on the left medial ankle wound. This was apparently looking about the same on Tuesday when she was here for nurse visit. 11/13/17; deep tissue culture I did last time of the left lower leg showed multiple organisms without any predominating. In particular no Staphylococcus or group A strep were isolated. We sent her for venous reflux studies. She's had a previous left greater saphenous vein stripping and I think sclerotherapy of the right greater saphenous vein. She didn't really look at the lesser saphenous vein this  both wounds are on the medial aspect. She has reflux in the common femoral vein and popliteal vein and an accessory vein on the right and the common femoral vein and popliteal vein on the left. I'm going to have her go to see vein and vascular just the look over things and see if anything else beside aggressive compression is indicated here. We have not been able to make any progress on these wounds in spite of the fact that the surface of the wounds is never look too bad. 11/20/17; no major change in the condition of the wounds. Patient reports a large amount of drainage. She has a lot of complaints of pain although enlisting her today I wonder if some of this at least his neuropathic rather than secondary to her wounds. She has an appointment with vein and vascular on 12/30/17. The refractory nature of these wounds in my mind at least need vein and vascular to look over the wounds the recent reflux studies we did and her history to see if anything further can be done here. I also note her gait is deteriorated quite a bit. 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 She has a bilateral Trendelenburg gait. I wonder if this is neuropathic or perhaps multilevel radicular. 11/27/17; her wounds actually looks slightly better. Healthy-looking granulation tissue a scant amount of epithelialization. [place] [facility] will not pay for [employer]. They will play for tri layer Oasis and Dermagraft. This is not a diabetic ulcer. We'll try for the tri layer Oasis. She still complains of some drainage. She has a vein and vascular appointment on 12/30/17 12/04/17; the wounds visually look quite good. Healthy-looking granulation with some degree of epithelialization. We are still waiting for response to our request for trial to try layer Oasis. Her appointment with vascular to  review venous and arterial issues isn't sold the end of July 7/31. Not allow debridement or even vigorous cleansing of the wound  surface. 12/18/17; slightly smaller especially on the right. 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 We've been using Hydrofera Blue. We still are looking into triple layer Oasis through her insurance 01/08/18 on evaluation today patient's wound actually appears to be showing signs of good improvement at this point in time. She has been tolerating the dressing changes without complication. Fortunately there does not appear to be any evidence of infection at this point in time. We have been utilizing silver nitrate which does seem to be of benefit for her which is also good news. Overall I'm very happy with how things seem to be both regards appearance as well as measurement. Patient did see Dr. Bridgett Larsson for evaluation on 12/30/17. In his assessment he felt that stripping would not likely add much more than chronic compression to the patient's healing process. His recommendation was to follow-up in three months with Dr. Doren Custard if she hasn't healed in order to consider referral back to you and see vascular where she previously was in a trial and was able to get her wound to heal. I'll be see what she feels she when you staying compression and he reiterated this as well. 01/13/18 on evaluation today patient appears to actually be doing very well in regard to her bilateral medial malleolus ulcers. She seems to have tolerated the chemical cauterization with silver nitrate last week she did have some pain through that evening but fortunately states that I'll be see since it seems to be doing better she is overall pleased with the progress. 01/21/18; really quite a remarkable improvement since I've last seen these wounds. We started using silver nitrate specially on the islands of hyper granulation which for some reason her around the wound circumference. This is really done quite nicely. Primary dressing Hydrofera Blue under 4 layer compression. She seems to be able to hold out without a  nurse rewrap. Follow-up in 1 week 01/28/18; we've continued the hydrofera blue but continued with chemical cauterization to the wound area that we started about a month ago for irregular hyper granulation. She is made almost stunning improvement in the overall wound dimensions. I was not really expecting this degree of improvement in these chronic wounds 02/05/18; we continue with Hydrofera Bluebut of also continued the aggressive chemical cauterization with silver nitrate. We made nice progress with the right greater than left wound. 02/12/18. We continued with Hydrofera Blue after aggressive chemical cauterization with silver nitrate. We appear to be making nice progress with both wound areas 02/19/2018; we continue with [facility] after washing the wounds vigorously with Anasept spray and chemical cauterization with silver nitrate. We are making excellent progress. The area on the right's just about closed 02/26/2018. The area on the left medial ankle had too much necrotic debris today. I used a #5 curette we are able to get most of the soft. I continued with the silver nitrate to the much smaller wound on the right medial ankle she had a new area on her right lower pretibial area which she says was due to a role in her compression 03/05/2018; both wound areas look healthy. Not much change in dimensions from last week. I continue to use silver nitrate and Hydrofera Blue. The patient saw Dr. Doren Custard of vein and vascular. He felt she had venous stasis ulcers. He felt based on her previous arteriogram she should  have adequate circulation for healing. Also she has deep venous reflux but really no significant correctable superficial venous reflux at this time. He felt we should continue with conservative management including leg elevation and compression 04/02/2018; since we last saw this woman about a month ago she had a fall apparently suffered a pelvic fracture. I did not look up the x-ray.  Nevertheless because of pain she literally was bedbound for 2 weeks and had home health coming out to change the dressing. Somewhat predictably this is resulted in considerable improvement in both wound areas. The right is just about closed on the medial malleolus and the left is about half the size. 04/16/2018; both her wounds continue to go down in size. Using Hydrofera Blue. 05/07/18; both her wounds appeared to be improving especially on the right where it is almost closed. We are using Hydrofera Blue 05/14/2018; slightly worse this week with larger wounds. Surface on the left medial not quite as good. We have been using Hydrofera Blue 05/21/18; again the wounds are slightly larger. Left medial malleolus slightly larger with eschar around the circumference. We have been using Hydrofera Blue undergoing a wraps for a prolonged period of time. This got a lot better when she was more recumbent due to a fall and a back injury. I change the primary dressing the silver alginate today. She did not tolerate a 4 layer compression previously although I may need to bring this up with her next time 05/28/2018; area on the left medial malleolus again is slightly larger with more drainage. Area on the right is roughly unchanged. She has a small area of folliculitis on the right medial just on the lower calf. This does not look ominous. 06/03/2018 left medial malleolus slightly smaller in a better looking surface. We used silver nitrate on this last time with silver alginate. The area on the right appears slightly smaller 1/10; left medial malleolus slightly smaller. Small open area on the right. We used silver nitrate and silver alginate as of 2 weeks ago. We continue with the wound and compression. These got a lot better when she was off her feet 1/17; right medial malleolus wound is smaller. The left may be slightly smaller. Both surfaces look somewhat better. 1/24; both wounds are slightly smaller. Using silver  alginate under Unna boots 1/31; both wounds appear smaller in fact the area on the right medial is just about closed. Surface eschar. We have been using silver alginate under Unna boots. The patient is less active now spends let much less time on her feet and I think this is contributed to the general improvement in the wound condition 2/7; both wounds appear smaller. I was hopeful the right medial would be closed however there there is still the same small open area. Slight amount of surface eschar on the left the dimensions are smaller there is eschar but the wound edges appear to be free. We have been using silver alginate under Unna boot's 2/14; both wounds once again measure smaller. Circumferential eschar on the left medial. We have been using silver alginate under Unna boots with gradual improvement 2/21; the area on the right medial malleolus has healed. The area on the left is smaller. We have been using silver alginate and Unna boots. We can discharge wrapping the right leg she has 20/30 stockings at home she will need to protect the scar tissue in this area 2/28; the area on the right medial malleolus remains closed the patient has a compression stocking.  The area on the left is smaller. We have been using silver alginate and Unna boots. 3/6 the area on the right medial ankle remains closed. Good edema control noted she is using her own compression stocking. The area on the left medial ankle is smaller. We have been managing this with silver alginate and Unna boots which we will continue today. 3/13; the area on the right medial ankle remains closed and I'm declaring it healed today. When necessary the left is about the same still a healthy-looking surface but no major change and wound area. No evidence of infection and using silver alginate under unna and generally making considerable improvement 3/27 the area on the right medial ankle remains closed the area on the left is about the same  as last week. Certainly not any worse we have been using silver alginate under an Unna boot 4/3; the area on the right medial ankle remains closed per the patient. We did not look at this wound. The wound on the left medial ankle is about the same surface looks healthy we have been using silver alginate under an Unna boot 4/10; area on the right medial ankle remains closed per the patient. We did not look at this wound. The wound on the left medial ankle is slightly larger. The patient complains that the [facility] caused burning pain all week. She also told us that she was a lot more active this week. Changed her back to silver alginate 4/17; right medial ankle still closed per the patient. Left medial ankle is slightly larger. Using silver alginate. She did not tolerate Hydrofera Blue on this area 4/24; right medial ankle remains closed we have not look at this. The left medial ankle continues to get larger today by about a centimeter. We have been using silver alginate under Unna boots. She complains about 4 layer compression as an alternative. She has been up on her feet working on her garden 5/8; right medial ankle remains closed we did not look at this. The left medial ankle has increased in size about 100%. We have been using silver alginate under Unna boots. She noted increased pain this week and was not surprised that the wound is deteriorated 5/15; no major change in SA however much less erythema ( one week of doxy ocellulitis). 5/22-65 year old female returns at 1 week to the clinic for left medial ankle wound for which we have been using silver alginate under 3 layer compression She was placed on DOXY at last visit - the wound is wider at this visit. She is in 3 layer compression 5/29; change to [facility] last week. I had given her empiric doxycycline 2 weeks ago for a week. She is in 3 layer compression. She complains of a lot of pain and drainage on presentation today. 6/5;  using Hydrofera Blue. I gave her doxycycline recently empirically for erythema and pain around the wound. Believe her cultures showed enterococcus which not would not have been well covered by doxycycline nevertheless the wound looks better and I don't feel specifically that the enterococcus needs to be covered. She has a new what looks like a wrap injury on her lateral left ankle. 6/12; she is using Hydrofera Blue. She has a new area on the left anterior lower tibial area. This was a wrap injury last week. 6/19; the patient is using Hydrofera Blue. She arrived with marked inflammation and erythema around the wound and tenderness. 12/01/18 on evaluation today patient appears to be doing a  little bit better based on what I'm hearing from the standpoint of lassos evaluation to this as far as the overall appearance of the wound is concerned. Then sometime substandard she typically sees Dr. Dellia Nims. Nonetheless overall very pleased with the progress that she's made up to this point. No fevers, chills, nausea, or vomiting noted at this time. 7/10; some improvement in the surface area. Aggressively debrided last week apparently. I went ahead with the debridement today although the patient does not tolerate this very well. We have been using Iodoflex. Still a fair amount of drainage 7/17; slightly smaller. Using Iodoflex. 7/24; no change from last week in terms of surface area. We have been using Iodoflex. Surface looks and continues to look somewhat better 7/31; surface area slightly smaller better looking surface. We have been using Iodoflex. This is under Unna boot compression 8/7-Patient presents at 1 week with Unna boot and Iodoflex, wound appears better 8/14-Patient presents at 1 week with Iodoflex, we use the Unna boot, wound appears to be stable better.Patient is getting Botox treatment for the inversion of the foot for tendon release, Next week 8/21; we are using Iodoflex. Unna boot. The wound is  stable in terms of surface area. Under illumination there is some areas of the wound that appear to be either epithelialized or perhaps this is adherent slough at this point I was not really clear. It did not wipe off and I was reluctant to debride this today. 8/28; we are using Iodoflex in an Unna boot. Seems to be making good improvement. 9/4; using Iodoflex and wound is slightly smaller. 9/18; we are using Iodoflex with topical silver nitrate when she is here. The wound continues to be smaller 10/2; patient missed her appointment last week due to GI issues. She left and Iodoflex based dressing on for 2 weeks. Wound is about the same size about the size of a dime on the left medial lower 10/9 we have been using Iodoflex on the medial left ankle wound. She has a new superficial probable wrap injury on the dorsal left ankle 10/16; we have been using Hydrofera Blue since last week. This is on the left medial ankle 10/23; we have been using Hydrofera Blue since 2 weeks ago. This is on the left medial ankle. Dimensions are better 11/6; using Hydrofera Blue. I think the wound is smaller but still not closed. Left medial ankle 11/13; we have been using Hydrofera Blue. Wound is certainly no smaller this week. Also the surface not as good. This is the remanent of a very large area on her left medial ankle. 11/20; using Sorbact since last week. Wound was about the same in terms of size although I was disappointed about the surface debris 12/11; 3-week follow-up. Patient was on vacation. Wound is measuring slightly larger we have been using Sorbact. 12/18; wound is about the same size however surface looks better last week after debridement. We have been using Sorbact under compression 1/15 wound is probably twice the size of last time increased in length nonviable surface. We have been using Sorbact. She was running a mild fever and missed her appointment last week 1/22; the wound is come down in size but  under illumination still a very adherent debris we have been Hydrofera Blue that I changed her to last week 1/29; dimensions down slightly. We have been using Hydrofera Blue 2/19 dimensions are the same however there is rims of epithelialization under illumination. Therefore more the surface area may be epithelialized 2/26;  the patient's wound actually measures smaller. The wound looks healthy. We have been using Hydrofera Blue. I had some thoughts about running Apligraf then I still may do that however this looks so much better this week we will delay that for now 3/5; the wound is small but about the same as last week. We have been using Hydrofera Blue. No debridement is required today. 3/19; the wound is about the size of a dime. Healthy looking wound even under illumination. We have been using Hydrofera Blue. No mechanical debridement is necessary 3/26; not much change from last week although still looks very healthy. We have been using Hydrofera Blue under Unna boots Patient was offered an ankle fusion by podiatry but not until the wound heals with a proceed with this. 4/9; the patient comes in today with her original wound on the medial ankle looking satisfactory however she has some uncontrolled swelling in the middle part of her leg with 2 new open areas superiorly just lateral to the tibia. I think this was probably a wrap issue. She said she felt uncomfortable during the week but did not call in. We have been using Hydrofera Blue 4/16; the wound on the medial ankle is about the same. She has innumerable small areas superior to this across her mid tibia. I think this is probably folliculitis. She is also been working in the yard doing a lot of sweating 4/30; the patient issue on the upper areas across her mid tibia of all healed. I think this was excessive yard work if I remember. Her wound on the medial ankle is smaller. Some debris on this we have been using Hydrofera Blue under Unna  boots 5/7; mid tibia. She has been using Hydrofera Blue under an Unna wrap. She is apparently going for her ankle surgery on June 3 10/28/19-Patient returns to clinic with the ankle wound, we are using Hydrofera Blue under Unna wrap, surgery is scheduled for her left foot for June 3 so she will be back for nurse visit next week READMISSION 01/17/2020 Mrs. Delia is a 65 year old woman we have had in this clinic for a long period of time with severe venous hypertension and refractory wounds on her medial lower legs and ankles bilaterally. 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 When she was off her legs for a prolonged period example when she fell and suffered a compression fracture things would heal up quite nicely. She is now retired and we managed to heal up the right medial leg wound. The left one was very tiny last time I saw this although still refractory. 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 It got to the point where this was interfering with her walking and she elected to proceed with a ankle arthrodesis to straighten her her ankle and leave her with a functional outcome for mobilization. The patient was referred to Dr. Doren Custard and really this took some time to arrange. Dr. Doren Custard saw her on 12/07/2019. Once again he verified that she had no arterial issues. She had previously had an angiogram several years ago. Follow-up ABIs on the left showed an ABI of 1.12 with triphasic waveforms and a TBI of 0.92. She is felt to have chronic deep venous insufficiency but I do not think it was felt  that anything could be done from about this from an ablation point of view. At the time Dr. Doren Custard saw this patient the wounds actually look closed via the pictures in his clinic. The patient finally  underwent her surgery on 12/15/2019. This went reasonably well and there was a good anatomic outcome. She developed a small distal wound dehiscence on the lateral part of the surgical wound. However more problematically she is developed recurrence of the wound on the medial left ankle. There are actually 2 wounds here one in the distal lower leg and 1 pretty much at the level of the medial malleolus. It is a more distal area that is more problematic. She has been using Hydrofera Blue which started on Friday before this she was simply Ace wrapping. There was a culture done that showed Pseudomonas and she is on ciprofloxacin. A recent CNS on 8/11 was negative. The patient reports some pain but I generally think this is improving. She is using a cam boot completely nonweightbearing using a walker for pivot transfers and a wheelchair 8/24; not much improvement unfortunately she has a surgical wound on the lateral part in the venous insufficiency wound medially. The bottom part of the medial insufficiency wound is still necrotic there is exposed tendon here. We have been using Hydrofera Blue under compression. Her edema control is however better 8/31; patient in for follow-up of his surgical wound on the lateral part of her left leg and chronic venous insufficiency ulcers medially. We put her back in compression last week. She comes in today with a complaint of 3 or 4 days worth of increasing pain. She felt her cam walker was rubbing on the area on the back of her heel. However there is intense erythema seems more likely she has cellulitis. She had 2 cultures done when she was seeing podiatry in the postop. One of them in late July showed Pseudomonas and she received a course of ciprofloxacin the other was negative on 8/11 she is allergic to penicillin with anaphylactoid complaints of hives oral swelling via information in epic 9/9; when I saw this patient last week she had intense anterior erythema around  her wound on the right lateral heel and ankle and also into the right medial heel. Some of this was no doubt drainage and her walker boot however I was convinced she had cellulitis. I gave her Levaquin and Bactrim she is finishing up on this now. She is following up with Dr. [name] he saw her yesterday. He is taken her out of the walking boot of course she is still nonweightbearing. Her x-ray was negative for any worrisome features such as soft tissue air etc. Things are a lot better this week. She has home health. We have been using Hydrofera Blue under an [employer] which she put back on yesterday. I did not wrap her last week 9/17; her surrounding skin looks a lot better. In fact the area on the left lateral ankle has just a scant amount of eschar. The only remaining wound is the large area on the left medial ankle. Probably about 60% of this is healthy granulation at the surface however she has a significant divot distally. This has adherent debris in it. I been using debridement and silver collagen to try and get this area to fill-in although I do not think we have made much progress this week 9/24; the patient's wound on the left medial ankle looks a lot better. The deeper divot area distally still requires  debridement but this is cleaning up quite nicely we have been using silver collagen. The patient is complaining of swelling in her foot and is worried that that is contributing to the nonhealing of the ankle wound. She is also complaining of numbness in her anterior toes 10/4; left medial ankle. The small area distally still has a divot with necrotic material that I have been debriding away. This has an undermining area. She is approved for Apligraf. She saw Dr. [name] her surgeon on 10/1. I think he declared himself is satisfied with the condition of things. Still nonweightbearing till the end of the month. 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 Her surgical  wound is well closed. There is no evidence of infection 10/11; the patient arrived in clinic today with the expectation that we be able to put an Apligraf on this area after debridement however she arrives with a relatively large amount of green drainage on the dressing. The patient states that this started on Friday. She has not been systemically unwell. 10/19; culture I did last week showed both Enterococcus and Pseudomonas. I think this came in separate parts because I stopped her ciprofloxacin I gave her and prescribed her linezolid however now looking at the final culture result this is Pseudomonas which is resistant to quinolones. She has not yet picked up the linezolid apparently phone issues. We are also trying to get a topical antibiotic out of [place] in [place] they can be applied by home health. She is still having green drainage 10/16; the patient has her topical antibiotic from [facility] in [place]. This is a compounded gel with vancomycin and ciprofloxacin and gentamicin. We are applying this on the wound bed with silver alginate over the top with Unna boot wraps. She arrives in clinic today with a lot less ominous looking drainage although she is only use this topical preparation once the second time today. She sees Dr. [name] her surgeon on Friday she has home health changing the dressing 11/2; still using her compounded topical antibiotic under silver alginate. Surface is cleaning up there is less drainage. We had an Apligraf for her today and I elected to apply it. A light coating of her antibiotic 04/25/2020 upon evaluation today patient appears to be doing well with regard to her ankle ulcer. There is a little bit of slough noted on the surface of the wound I am can have to perform sharp debridement to clear this away today. With that being said other than that fact overall I feel like she is making progress and we do see some new epithelial growth. There is also  some improvement in the depth of the wound and that distal portion. There is little bit of slough there as well. 12/7; 2-week follow-up. Apligraf #3. Dimensions are smaller. Closing in especially inferiorly. Still some surface debris. Still using the [facility] topical antibiotic but I told her that I don't think this needs to be renewed 12/21; 2-week follow-up. Apligraf #4 dimensions are smaller. Nice improvement 06/05/2020; 2-week follow-up. The patient's wound on the left medial ankle looks really excellent. Nice granulation. Advancing epithelialization no undermining no evidence of infection. We would have to reapply for another Apligraf but with the condition of this wound I did not feel strongly about it. We used Hydrofera Blue under the same degree of compression. She follows up with Dr. [name] her surgeon a week Friday 06/13/2020 upon evaluation today patient appears to be doing excellent in  regard to her wound. She has been tolerating the dressing changes without complication. Fortunately there is no signs of active infection at this time. No fevers, chills, nausea, vomiting, or diarrhea. She was using Hydrofera Blue last week. 06/20/2020 06/20/2020 on evaluation today patient actually appears to be doing excellent in regard to her wound. This is measuring better and looking much better as well. She has been using the collagen that seems to be doing better for her as well even though the [facility] was and is not sticking or feeling as rough on her wound. She did see Dr. [name] on Friday he is very pleased he also stated none of the hardware has shifted. That is great news 1/27; the patient has a small clean wound all that is remaining. I agree that this is too small to really consider an Apligraf. Under illumination the surface is looking quite good. We have been using collagen although the dimensions are not any better this week 2/2; the patient has a small clean wound on the left medial ankle.  Although this left of her substantial original areas. Measurements are smaller. We have been using polymen Ag under an Haematologist. 2/10; small area on the left medial ankle. This looks clean nothing to debride however dimensions are about the same we have been using polymen I think now for 2 weeks 2/17; not much change in surface area. We have been using polymen Ag without any improvement. 3/17; 1 month follow-up. The patient has been using endoform without any improvement in fact I think this looks worse with more depth and more expansion 3/24; no improvement. Perhaps less debris on the surface. We have been using Sorbact for 1 week 4/4; wound measures larger. She has edema in her leg and her foot which she tells as her wrap came down. We have been using Unna boots. Sorbact of the wound. She has been approved for Apligraf 09/12/2020 upon evaluation today patient appears to be doing well with regard to her wound. We did get the Apligraf reapproved which is great news we have that available for application today. Fortunately there is no signs of infection and overall the patient seems to be doing great. The wound bed is nice and clean. 4/27; patient presents for her second application of Apligraf. She states over the past week she has been on her feet more often due to being outside in her garden. She has noted more swelling to her foot as a result. She denies increased warmth, pain or erythema to the wound site. 10/10/2020 upon evaluation today patient appears to be doing well with regard to her wound which does not appear to be quite as irritated as last week from what I am hearing. With that being said unfortunately she is having issues with some erythema and warmth to touch as well as an increase size. I do believe this likely is infected. 10/17/2020 upon evaluation today patient appears to be doing excellent in regard to her wound this is significantly improved as compared to last week. Fortunately  I think that the infection is much better controlled at this point. She did have evidence of both Enterococcus as well as Staphylococcus noted on culture. Enterococcus really would not be helped significantly by the Cipro but the wound is doing so much better I am under the assumption that the Staphylococcus is probably the main organism that is causing the current infection. Nonetheless I think that she is doing excellent as far as that is  concerned and I am very pleased in that regard. I would therefore recommend she continue with the Cipro. 10/31/2020 upon evaluation today patient appears to be doing well with regard to her wound. She has been tolerating the dressing changes without complication. Fortunately there is no signs of active infection and overall I am extremely pleased with where things stand today. No fevers, chills, nausea, vomiting, or diarrhea. With that being said she does have some green drainage coming from the wound and although it looks okay I am a little concerned about the possibility of a continuing infection. Specifically with Pseudomonas. For that reason I will go ahead and send in a prescription for Cipro for her to be continued. 11/14/2020 upon evaluation today patient appears to be doing very well currently in regard to her wound on her leg. She has been tolerating the dressing changes without complication. Fortunately I feel like the infection is finally under good control here. Unfortunately we do not have the Apligraf for application today although we can definitely order to have it in place for next week. That will be her fifth and final of the current series. Nonetheless I feel like her wound is really doing quite well which is great news. 11/21/2020 upon evaluation today patient appears to be doing well with regard to her wound on the medial ankle. Fortunately I think the infection is under control and I do believe we can go ahead and reapply the Apligraf today. She is  in agreement with that plan. There does not appear to be any signs of active infection at this time which is great news. No fevers, chills, nausea, vomiting, or diarrhea. 12/05/2020 upon evaluation today patient's wound bed actually showed signs of good granulation epithelization at this point. There does not appear to be any signs of infection which is great news and overall very pleased with where things stand. Overall the patient seems to be doing fairly well in my opinion with regard to her wound although I do believe she continues to build up a lot of biofilm I think she could benefit from using PuraPly at this point. 12/12/2020 upon evaluation today patient's wound actually appears to be doing decently well today. The Unna boot has not been quite as well-tolerated so that more uncomfortable for her and even causing some pressure over the plate on the lateral portion of her foot which is 90 where the wound is. There did not appear to be any significant deep tissue injury with that there may be a minimal change in the skin noted I think that we may want to go back to the Coflex 2 layer which is a little bit easier on her skin it seems. 12/19/2020 upon evaluation today patient actually seems to be making great progress with the PuraPly currently. She in fact seems to be much better as far as the overall appearance of the wound bed is concerned I am very happy in this regard. I do not see any signs of of infection which is great news as well. No fevers, chills, nausea, vomiting, or diarrhea. 12/26/2020 upon evaluation today patient appears to actually be doing better in regard to her wound on the left medial ankle region. The surface of the wound is actually doing significantly better which is great news. There does not appear to be any signs of infection which is also great news and in general I am extremely pleased with where we stand today. 01/02/2021 upon evaluation today patient appears to be doing  well with regard to her wound. In fact this is showing signs of excellent improvement and very pleased with where things stand. In fact the last 3 appointments have all shown signs of this getting smaller which is excellent news. I have not even had to perform any debridement and today is no exception. Overall I feel like this is dramatically improved compared to previous. T oday is PuraPly application #4. [id number] upon evaluation today patient appears to be doing excellent in regard to her wound this is continue to show signs of improvement and overall I am extremely pleased with where we stand today. She is actually here for PuraPly application #5. Every time we have applied this she is noted definite improvement on measurements. 01/23/2021 upon evaluation today patient is actually making good progress in regard to her wound. This was actually on just a little bit longer this time compared to previous due to the fact that she did have to go out of town. She is actually here for PuraPly application #6. We have definitely been seeing improvements in the overall quality of the tissue on the surface of the wound which is awesome news. In general I think that the patient seems to be continuing to make great progress here. 01/30/2021 upon evaluation today patient's wound is actually doing excellent. There is really not any significant biofilm buildup which is great news and overall I am extremely pleased with where things stand today. There does not appear to be any signs of active infection. No fevers, chills, nausea, vomiting, or diarrhea. 02/06/2021 upon evaluation today patient's wound is actually showing signs of excellent improvement which is great news. We continue to see the benefit of the PuraPly this is doing a great job the wound seems not really irritated whatsoever and is showing signs of good granulation at this point. Overall I am extremely happy with what we are seeing. The patient likewise  is happy to hear all of this as well. 02/13/2021 upon evaluation today patient appears to be doing well with regard to her wound. This again is measuring smaller today and I am very pleased in this regard. Fortunately there does not appear to be any signs of active infection at this time which is good news from a systemic standpoint. Locally there is still some significant drainage which she does have is concerned about infection locally. No fevers, chills, nausea, vomiting, or diarrhea. 02/20/2021 upon evaluation today patient actually appears to be doing decently well in regard to her wound. She has been tolerating the dressing changes without complication. I do believe the PuraPly is helping wound bed does appear to be doing better. There is no evidence of active infection locally or systemically at this point visually although on fluorescence imaging there still appear to be bacterial load and bioburden noted. 02/27/2021 upon evaluation today patient fortunately continues to show signs of improvement with use of the PuraPly currently. Subsequently we did review her culture results and to be honest I think that the Bactrim is probably the best option to have her continue at this point. For that reason I am get a go ahead and send in a refill today for her for an additional 2 weeks. Nonetheless I think that we are making excellent progress here. It was Enterococcus and Staphylococcus that were noted she is allergic to penicillin so there is not much I can do from the Enterococcus standpoint though the staff does seem to be sensitive to the Bactrim. 03/06/2021 upon evaluation today  patient appears to be doing well with regard to her wound. This is definitely showing signs of improvement which is great news and overall I am extremely pleased with where things stand today. There does not appear to be any signs of active infection which is great news and overall and I do believe that we are headed in the  appropriate direction I think the PuraPly is doing an awesome job for her. Objective Constitutional Well-nourished and well-hydrated in no acute distress. Vitals Time Taken: 10:42 AM, Height: 68 in, Temperature: 98.2 F, Pulse: 53 bpm, Respiratory Rate: 20 breaths/min, Blood Pressure: 133/68 mmHg. Respiratory normal breathing without difficulty. Psychiatric this patient is able to make decisions and demonstrates good insight into disease process. Alert and Oriented x 3. pleasant and cooperative. General Notes: Upon inspection patient's wound bed did require some slight sharp debridement to clear away some of the necrotic debris she tolerated that debridement today without complication and postdebridement wound bed appears to be doing much better which is great news. I then did proceed to apply the PuraPly today and she tolerated that application without complication we used Adaptic to cover. Integumentary (Hair, Skin) Wound #15 status is Open. Original cause of wound was Gradually Appeared. The date acquired was: 12/30/2019. The wound has been in treatment 59 weeks. The wound is located on the Left,Medial Malleolus. The wound measures 1.6cm length x 0.7cm width x 0.1cm depth; 0.88cm^2 area and 0.088cm^3 volume. There is Fat Layer (Subcutaneous Tissue) exposed. There is no tunneling or undermining noted. There is a small amount of purulent drainage noted. The wound margin is flat and intact. There is large (67-100%) pink granulation within the wound bed. There is no necrotic tissue within the wound bed. 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 There was a Selective/Open Wound Skin/Epidermis Debridement with a total area of 1.12 sq cm performed by [name], PA. With the following instrument(s): Curette to remove Viable and Non-Viable tissue/material. Material removed includes Skin: Dermis and Skin: Epidermis and after achieving pain control using Lidocaine 4% T opical Solution. A time out was conducted at 10:57, prior to the start of the procedure. A Minimum amount of bleeding was controlled with Pressure. The procedure was tolerated well with a pain level of 0 throughout and a pain level of 0 following the procedure. Post Debridement Measurements: 1.6cm length x 0.7cm width x 0.1cm depth; 0.088cm^3 volume. Character of Wound/Ulcer Post Debridement is improved. Severity of Tissue Post Debridement is: Fat layer exposed. Post procedure Diagnosis Wound #15: Same as Pre-Procedure Pre-procedure diagnosis of Wound #15 is a Venous Leg Ulcer located on the Left,Medial Malleolus. A skin graft procedure using a bioengineered skin substitute/cellular or tissue based product was performed by [name], PA with the following instrument(s): Forceps and Scissors. Puraply AM was applied and secured with adaptic. 6 sq cm of product was utilized and 6 sq cm was wasted due to wound size. Post Application, no dressing was applied. A Time Out was conducted at 11:07, prior to the start of the procedure. The procedure was tolerated well with a pain level of 3 throughout and a pain level  of 3 following the procedure. Post procedure Diagnosis Wound #15: Same as Pre-Procedure . Plan Follow-up Appointments: Return Appointment in 2 weeks. Margarita Grizzle Nurse Visit: - Nurse visit next week 03/13/2021 ****Use Endoform as primary dressing at nurse visit.**** Cellular or Tissue Based Products: Cellular or Tissue Based Product Type: - puraply AM#12 Cellular or Tissue Based Product applied to  wound bed, secured with steri-strips, cover with Adaptic or Mepitel. (DO NOT REMOVE). - no steristrips Bathing/ Shower/ Hygiene: May shower with protection but do not get wound dressing(s) wet. Edema Control - Lymphedema / SCD / Other: Elevate legs to the level of the heart or above for 30 minutes daily and/or when sitting, a frequency of: - throughout the day Avoid standing for long periods of time. Exercise regularly Compression stocking or Garment 20-30 mm/Hg pressure to: - right leg daily Additional Orders / Instructions: Follow Nutritious Diet WOUND #15: - Malleolus Wound Laterality: Left, Medial Cleanser: Soap and Water 1 x Per Week/30 Days Discharge Instructions: May shower and wash wound with dial antibacterial soap and water prior to dressing change. Peri-Wound Care: Triamcinolone 15 (g) 1 x Per Week/30 Days Discharge Instructions: Use triamcinolone 15 (g) as directed Peri-Wound Care: Sween Lotion (Moisturizing lotion) 1 x Per Week/30 Days Discharge Instructions: Apply moisturizing lotion as directed Prim Dressing: PuraPly AM 1 x Per Week/30 Days ary Discharge Instructions: applied by provider covered with adaptic only. *****At nurse visit on 03/13/2021 use ENDOFORM AS PRIMARY*** Secondary Dressing: Woven Gauze Sponge, Non-Sterile 4x4 in 1 x Per Week/30 Days Discharge Instructions: to bolster skin sub Secondary Dressing: ABD Pad, 5x9 1 x Per Week/30 Days Discharge Instructions: Apply over primary dressing and lateral ankle Secondary Dressing: Optifoam Non-Adhesive Dressing, 4x4 in 1 x Per Week/30 Days Discharge Instructions: Apply over primary dressing pad lateral foot with foam donut Secondary Dressing: ADAPTIC TOUCH 3x4.25 in 1 x Per Week/30 Days Discharge Instructions: Apply over skin sub Com pression Wrap: CoFlex TLC XL 2-layer Compression System 4x7 (in/yd) 1 x Per Week/30 Days Discharge Instructions: Apply CoFlex 2-layer compression as directed. (alt for 4 layer) 1. Would  recommend currently that we continue with the PuraPly and Adaptic I think she is doing a great job. Next week as I am on vacation and out of the office I am going to have them use endoform still using Adaptic over top. 2. With regard to the compression wrapping we will also continue with the Coflex 2 layer compression wrap 3. I would also recommend that we have the patient continue with the ABD pad to cover before the compression wrap is applied. We will see patient back for reevaluation in 1 week here in the clinic. If anything worsens or changes patient will contact our office for additional recommendations. Electronic Signature(s) Signed: 03/06/2021 1:45:24 PM By: [name] PA-C Entered By: [name] on 03/06/2021 13:45:23 -------------------------------------------------------------------------------- SuperBill Details Patient Name: Date of Service: [facility] [name], [name] 03/06/2021 Medical Record Number: [id number] Patient Account Number: [id number] Date of Birth/Sex: Treating RN: [birth year] (65 y.o. [name], Meta.Reding Primary Care Provider: [name] Other Clinician: Referring Provider: Treating Provider/Extender: [name] in Treatment: 23 Diagnosis Coding ICD-10 Codes Code Description [phone] Chronic venous hypertension (idiopathic) with ulcer and inflammation of left lower extremity L97.828 Non-pressure chronic ulcer of other part of left lower leg with other specified severity L97.328 Non-pressure chronic ulcer of left ankle with other specified severity Facility Procedures CPT4 Code: [id number] Description: [id number] PuraPly AM 3X4 (12sq.  cm) enter 12qty Modifier: Quantity: 12 CPT4 Code: [id number] Description: [id number] - SKIN SUB GRAFT TRNK/ARM/LEG ICD-10 Diagnosis Description L97.328 Non-pressure chronic ulcer of left ankle with other specified severity Modifier: Quantity: 1 Physician Procedures : CPT4 Code Description Modifier [id number]  [id number] - WC PHYS SKIN SUB GRAFT TRNK/ARM/LEG ICD-10 Diagnosis Description L97.328 Non-pressure chronic ulcer of left ankle with other specified severity Quantity: 1 Electronic Signature(s) Signed: 03/06/2021 1:45:36 PM By: [name] PA-C Entered By: [name] on 03/06/2021 13:45:35

## 2021-03-07 NOTE — Progress Notes (Signed)
STEFAN, KAREN ([id number]) Visit Report for 03/06/2021 Arrival Information Details Patient Name: Date of Service: [facility] [name], [name] 03/06/2021 10:00 A M Medical Record Number: [id number] Patient Account Number: [id number] Date of Birth/Sex: Treating RN: 1956/04/17 (65 y.o. [name], Meta.Reding Primary Care Demita Tobia: [name] Other Clinician: Referring Matilyn Fehrman: Treating Nadira Single/Extender: [name] in Treatment: 19 Visit Information History Since Last Visit Added or deleted any medications: No Patient Arrived: Cane Any new allergies or adverse reactions: No Arrival Time: 10:45 Had a fall or experienced change in No Accompanied By: self activities of daily living that may affect Transfer Assistance: None risk of falls: Patient Identification Verified: Yes Signs or symptoms of abuse/neglect since last visito No Secondary Verification Process Completed: Yes Hospitalized since last visit: No Patient Requires Transmission-Based Precautions: No Implantable device outside of the clinic excluding No Patient Has Alerts: Yes cellular tissue based products placed in the center Patient Alerts: L ABI =1.12, TBI = .92 since last visit: R ABI= 1.02, TBI= .58 Has Dressing in Place as Prescribed: Yes Has Compression in Place as Prescribed: Yes Pain Present Now: Yes Electronic Signature(s) Signed: 03/07/2021 6:25:33 PM By: Deon Pilling RN, BSN Entered By: Deon Pilling on 03/06/2021 10:46:08 -------------------------------------------------------------------------------- Encounter Discharge Information Details Patient Name: Date of Service: [name], [name] 03/06/2021 10:00 A M Medical Record Number: [id number] Patient Account Number: [id number] Date of Birth/Sex: Treating RN: Nov 20, 1955 (65 y.o. [name] Primary Care Amir Glaus: [name] Other Clinician: Referring Lakrisha Iseman: Treating Aundray Cartlidge/Extender: [name] in  Treatment: 2 Encounter Discharge Information Items Post Procedure Vitals Discharge Condition: Stable Temperature (F): 98.2 Ambulatory Status: Cane Pulse (bpm): 52 Discharge Destination: Home Respiratory Rate (breaths/min): 20 Transportation: Private Auto Blood Pressure (mmHg): 133/68 Accompanied By: self Schedule Follow-up Appointment: Yes Clinical Summary of Care: Electronic Signature(s) Signed: 03/07/2021 6:25:33 PM By: Deon Pilling RN, BSN Entered By: Deon Pilling on 03/06/2021 11:15:14 -------------------------------------------------------------------------------- Lower Extremity Assessment Details Patient Name: Date of Service: [name], [name] 03/06/2021 10:00 A M Medical Record Number: [id number] Patient Account Number: [id number] Date of Birth/Sex: Treating RN: Nov 29, 1955 (65 y.o. [name] Primary Care Emry Tobin: [name] Other Clinician: Referring Laverne Hursey: Treating Amin Fornwalt/Extender: [name] in Treatment: 20 Edema Assessment Assessed: [Left: Yes] [Right: No] Edema: [Left: Ye] [Right: s] Calf Left: Right: Point of Measurement: 31 cm From Medial Instep 26.5 cm Ankle Left: Right: Point of Measurement: 11 cm From Medial Instep 22 cm Vascular Assessment Pulses: Dorsalis Pedis Palpable: [Left:Yes] Electronic Signature(s) Signed: 03/07/2021 6:25:33 PM By: Deon Pilling RN, BSN Entered By: Deon Pilling on 03/06/2021 10:50:45 -------------------------------------------------------------------------------- [place] Details Patient Name: Date of Service: [name], [name] 03/06/2021 10:00 A M Medical Record Number: [id number] Patient Account Number: [id number] Date of Birth/Sex: Treating RN: May 11, 1956 (65 y.o. [name] Primary Care Rhys Lichty: [name] Other Clinician: Referring Mylinda Brook: Treating Genesia Caslin/Extender: [name] in Treatment:  49 Multidisciplinary Care Plan reviewed with physician Active Inactive Venous Leg Ulcer Nursing Diagnoses: Actual venous Insuffiency (use after diagnosis is confirmed) Knowledge deficit related to disease process and management Goals: Patient will maintain optimal edema control Date Initiated: 06/20/2020 Target Resolution Date: 03/27/2021 Goal Status: Active Interventions: Assess peripheral edema status every visit. Compression as ordered Treatment Activities: Therapeutic compression applied : 06/20/2020 Notes: 01/30/21: Edema control ongoing, using CoFlex wrap. Wound/Skin Impairment Nursing Diagnoses: Impaired tissue integrity Knowledge deficit related to  ulceration/compromised skin integrity Goals: Patient/caregiver will verbalize understanding of skin care regimen Date Initiated: 01/17/2020 Target Resolution Date: 03/27/2021 Goal Status: Active Ulcer/skin breakdown will have a volume reduction of 30% by week 4 Date Initiated: 01/17/2020 Date Inactivated: 03/12/2020 Target Resolution Date: 03/09/2020 Goal Status: Met Interventions: Assess patient/caregiver ability to obtain necessary supplies Assess patient/caregiver ability to perform ulcer/skin care regimen upon admission and as needed Assess ulceration(s) every visit Provide education on ulcer and skin care Notes: 01/30/21: Wound care regimen continues, PuraPly skin sub being used. Electronic Signature(s) Signed: 03/07/2021 6:25:33 PM By: Deon Pilling RN, BSN Entered By: Deon Pilling on 03/06/2021 10:58:07 -------------------------------------------------------------------------------- Pain Assessment Details Patient Name: Date of Service: [name], [name] 03/06/2021 10:00 A M Medical Record Number: [id number] Patient Account Number: [id number] Date of Birth/Sex: Treating RN: [birth year] (66 y.o. [name] Primary Care Tamel Abel: [name] Other Clinician: Referring Dhruti Ghuman: Treating Annalynn Centanni/Extender:  [name] in Treatment: 49 Active Problems Location of Pain Severity and Description of Pain Patient Has Paino Yes Site Locations Pain Location: Pain in Ulcers Rate the pain. Current Pain Level: 3 Worst Pain Level: 10 Least Pain Level: 0 Tolerable Pain Level: 8 Pain Management and Medication Current Pain Management: Medication: No Cold Application: No Rest: No Massage: No Activity: No T.E.N.S.: No Heat Application: No Leg drop or elevation: No Is the Current Pain Management Adequate: Adequate How does your wound impact your activities of daily livingo Sleep: No Bathing: No Appetite: No Relationship With Others: No Bladder Continence: No Emotions: No Bowel Continence: No Work: No Toileting: No Drive: No Dressing: No Hobbies: No Engineer, maintenance) Signed: 03/07/2021 6:25:33 PM By: Deon Pilling RN, BSN Entered By: Deon Pilling on 03/06/2021 10:46:45 -------------------------------------------------------------------------------- Patient/Caregiver Education Details Patient Name: Date of Service: [name], [name] 10/5/2022andnbsp10:00 A M Medical Record Number: [id number] Patient Account Number: [id number] Date of Birth/Gender: Treating RN: [birth year] (65 y.o. [name] Primary Care Physician: [name] Other Clinician: Referring Physician: Treating Physician/Extender: [name] in Treatment: 80 Education Assessment Education Provided To: Patient Education Topics Provided Wound/Skin Impairment: Handouts: Skin Care Do's and Dont's Methods: Explain/Verbal Responses: Reinforcements needed Electronic Signature(s) Signed: 03/07/2021 6:25:33 PM By: Deon Pilling RN, BSN Entered By: Deon Pilling on 03/06/2021 10:58:24 -------------------------------------------------------------------------------- Wound Assessment Details Patient Name: Date of Service: [name], [name] 03/06/2021  10:00 A M Medical Record Number: [id number] Patient Account Number: [id number] Date of Birth/Sex: Treating RN: Jun 14, 1955 (65 y.o. [name], [name] Primary Care Yaeli Hartung: [name] Other Clinician: Referring Romelle Reiley: Treating Bettye Sitton/Extender: [name] in Treatment: 5 Wound Status Wound Number: 15 Primary Venous Leg Ulcer Etiology: Wound Location: Left, Medial Malleolus Wound Status: Open Wounding Event: Gradually Appeared Comorbid Peripheral Venous Disease, Osteoarthritis, Confinement Date Acquired: 12/30/2019 History: Anxiety Weeks Of Treatment: 59 Clustered Wound: No Photos Wound Measurements Length: (cm) 1.6 Width: (cm) 0.7 Depth: (cm) 0.1 Area: (cm) 0.88 Volume: (cm) 0.088 % Reduction in Area: 92.5% % Reduction in Volume: 99.1% Epithelialization: Medium (34-66%) Tunneling: No Undermining: No Wound Description Classification: Full Thickness With Exposed Support Structures Wound Margin: Flat and Intact Exudate Amount: Small Exudate Type: Purulent Exudate Color: yellow, brown, green Foul Odor After Cleansing: No Slough/Fibrino No Wound Bed Granulation Amount: Large (67-100%) Exposed Structure Granulation Quality: Pink Fascia Exposed: No Necrotic Amount: None Present (0%) Fat Layer (Subcutaneous Tissue) Exposed: Yes Tendon Exposed: No Muscle Exposed: No Joint Exposed: No Bone Exposed: No Treatment Notes  Wound #15 (Malleolus) Wound Laterality: Left, Medial Cleanser Soap and Water Discharge Instruction: May shower and wash wound with dial antibacterial soap and water prior to dressing change. Peri-Wound Care Triamcinolone 15 (g) Discharge Instruction: Use triamcinolone 15 (g) as directed Sween Lotion (Moisturizing lotion) Discharge Instruction: Apply moisturizing lotion as directed Topical Primary Dressing PuraPly AM Discharge Instruction: applied by Tyrion Glaude covered with adaptic only. *****At nurse visit on 03/13/2021  use ENDOFORM AS PRIMARY*** Secondary Dressing Woven Gauze Sponge, Non-Sterile 4x4 in Discharge Instruction: to bolster skin sub ABD Pad, 5x9 Discharge Instruction: Apply over primary dressing and lateral ankle Optifoam Non-Adhesive Dressing, 4x4 in Discharge Instruction: Apply over primary dressing pad lateral foot with foam donut ADAPTIC TOUCH 3x4.25 in Discharge Instruction: Apply over skin sub Secured With Compression Wrap CoFlex TLC XL 2-layer Compression System 4x7 (in/yd) Discharge Instruction: Apply CoFlex 2-layer compression as directed. (alt for 4 layer) Compression Stockings Add-Ons Electronic Signature(s) Signed: 03/07/2021 6:25:33 PM By: Deon Pilling RN, BSN Entered By: Deon Pilling on 03/06/2021 10:56:52 -------------------------------------------------------------------------------- Vitals Details Patient Name: Date of Service: [name], [name] 03/06/2021 10:00 A M Medical Record Number: [id number] Patient Account Number: [id number] Date of Birth/Sex: Treating RN: [birth year] (65 y.o. [name], Meta.Reding Primary Care Bjorn Hallas: [name] Other Clinician: Referring Aissata Wilmore: Treating Raimundo Corbit/Extender: [name] in Treatment: 47 Vital Signs Time Taken: 10:42 Temperature (F): 98.2 Height (in): 68 Pulse (bpm): 53 Respiratory Rate (breaths/min): 20 Blood Pressure (mmHg): 133/68 Reference Range: 80 - 120 mg / dl Electronic Signature(s) Signed: 03/07/2021 6:25:33 PM By: Deon Pilling RN, BSN Entered By: Deon Pilling on 03/06/2021 10:46:26

## 2021-03-13 ENCOUNTER — Other Ambulatory Visit: Payer: Self-pay

## 2021-03-13 ENCOUNTER — Encounter (HOSPITAL_BASED_OUTPATIENT_CLINIC_OR_DEPARTMENT_OTHER): Payer: Medicare Other | Admitting: Internal Medicine

## 2021-03-13 DIAGNOSIS — I87332 Chronic venous hypertension (idiopathic) with ulcer and inflammation of left lower extremity: Secondary | ICD-10-CM | POA: Diagnosis not present

## 2021-03-13 NOTE — Progress Notes (Signed)
JACARIA, LIGGON ([id number]) Visit Report for 03/13/2021 SuperBill Details Patient Name: Date of Service: [facility], [name] 03/13/2021 Medical Record Number: [id number] Patient Account Number: [id number] Date of Birth/Sex: Treating RN: Jul 28, 1955 (65 y.o. 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

## 2021-03-14 NOTE — Progress Notes (Signed)
LASHAUNDA, BRIGNONI ([id number]) Visit Report for 03/13/2021 Arrival Information Details Patient Name: Date of Service: [facility] [name], [name] 03/13/2021 9:00 A M Medical Record Number: [id number] Patient Account Number: [id number] Date of Birth/Sex: Treating RN: 11-19-1955 (65 y.o. [name] Primary Care Soleia Badolato: [name] Other Clinician: Referring Daphene Chisholm: Treating Jeremaine Maraj/Extender: [name] in Treatment: 49 Visit Information History Since Last Visit Added or deleted any medications: No Patient Arrived: [name] Any new allergies or adverse reactions: No Arrival Time: 09:09 Had a fall or experienced change in No Accompanied By: self activities of daily living that may affect Transfer Assistance: None risk of falls: Patient Identification Verified: Yes Signs or symptoms of abuse/neglect since last visito No Secondary Verification Process Completed: Yes Hospitalized since last visit: No Patient Requires Transmission-Based Precautions: No Implantable device outside of the clinic excluding No Patient Has Alerts: Yes cellular tissue based products placed in the center Patient Alerts: L ABI =1.12, TBI = .92 since last visit: R ABI= 1.02, TBI= .58 Has Dressing in Place as Prescribed: Yes Pain Present Now: Yes Electronic Signature(s) Signed: 03/13/2021 1:46:44 PM By: [name] Entered By: [name] on 03/13/2021 09:11:33 -------------------------------------------------------------------------------- Compression Therapy Details Patient Name: Date of Service: [name], [name] 03/13/2021 9:00 A M Medical Record Number: [id number] Patient Account Number: [id number] Date of Birth/Sex: Treating RN: 10/29/1955 (65 y.o. [name] Tammara Massing: [name] Other Clinician: Referring Stephnie Parlier: Treating Annamaria Salah/Extender: [name] in Treatment: 60 Compression Therapy Performed for Wound  Assessment: Wound #15 Left,Medial Malleolus Performed By: Clinician Baruch Gouty, RN Compression Type: Double Layer Electronic Signature(s) Signed: 03/13/2021 4:40:08 PM By: Baruch Gouty RN, BSN Entered By: Baruch Gouty on 03/13/2021 09:39:34 -------------------------------------------------------------------------------- Encounter Discharge Information Details Patient Name: Date of Service: [name], [name] 03/13/2021 9:00 A M Medical Record Number: [id number] Patient Account Number: [id number] Date of Birth/Sex: Treating RN: 1955/11/07 (65 y.o. [name] Celicia Minahan: Other Clinician: [name] Referring Reyan Helle: Treating Dearius Hoffmann/Extender: [name] in Treatment: 53 Encounter Discharge Information Items Discharge Condition: Stable Ambulatory Status: Cane Discharge Destination: Home Transportation: Private Auto Accompanied By: self Schedule Follow-up Appointment: Yes Clinical Summary of Care: Patient Declined Electronic Signature(s) Signed: 03/13/2021 4:40:08 PM By: Baruch Gouty RN, BSN Entered By: Baruch Gouty on 03/13/2021 09:41:39 -------------------------------------------------------------------------------- Patient/Caregiver Education Details Patient Name: Date of Service: [name], [name] 10/12/2022andnbsp9:00 A M Medical Record Number: [id number] Patient Account Number: [id number] Date of Birth/Gender: Treating RN: 12-Dec-1955 (65 y.o. [name] Physician: [name] Other Clinician: Referring Physician: Treating Physician/Extender: [name] in Treatment: 36 Education Assessment Education Provided To: Patient Education Topics Provided Venous: Methods: Explain/Verbal Responses: Reinforcements needed, State content correctly Electronic Signature(s) Signed: 03/13/2021 4:40:08 PM By: Baruch Gouty RN, BSN Entered By: Baruch Gouty  on 03/13/2021 09:41:11 -------------------------------------------------------------------------------- Wound Assessment Details Patient Name: Date of Service: [name], [name] 03/13/2021 9:00 A M Medical Record Number: [id number] Patient Account Number: [id number] Date of Birth/Sex: Treating RN: [birth year] (65 y.o. [name] Primary Care Siriyah Ambrosius: [name] Other Clinician: Referring Takirah Binford: Treating Amelianna Meller/Extender: [name] in Treatment: 60 Wound Status Wound Number: 15 Primary Etiology: Venous Leg Ulcer Wound Location: Left, Medial Malleolus Wound Status: Open Wounding Event: Gradually Appeared Date Acquired: 12/30/2019 Weeks Of Treatment: 60 Clustered Wound: No Wound Measurements Length: (cm) 1.6 Width: (cm) 0.7 Depth: (cm) 0.1 Area: (cm)  0.88 Volume: (cm) 0.088 % Reduction in Area: 92.5% % Reduction in Volume: 99.1% Wound Description Classification: Full Thickness With Exposed Support Structure Exudate Amount: Small Exudate Type: Purulent Exudate Color: yellow, brown, green s Treatment Notes Wound #15 (Malleolus) Wound Laterality: Left, Medial Cleanser Soap and Water Discharge Instruction: May shower and wash wound with dial antibacterial soap and water prior to dressing change. Peri-Wound Care Triamcinolone 15 (g) Discharge Instruction: Use triamcinolone 15 (g) as directed Sween Lotion (Moisturizing lotion) Discharge Instruction: Apply moisturizing lotion as directed Topical Primary Dressing Endoform 2x2 in Discharge Instruction: Moisten with saline Secondary Dressing Woven Gauze Sponge, Non-Sterile 4x4 in Discharge Instruction: to bolster skin sub ABD Pad, 5x9 Discharge Instruction: Apply over primary dressing and lateral ankle Optifoam Non-Adhesive Dressing, 4x4 in Discharge Instruction: Apply over primary dressing pad lateral foot with foam donut Secured With Compression Wrap CoFlex TLC XL 2-layer  Compression System 4x7 (in/yd) Discharge Instruction: Apply CoFlex 2-layer compression as directed. (alt for 4 layer) Compression Stockings Add-Ons Electronic Signature(s) Signed: 03/13/2021 1:46:44 PM By: [name] Signed: 03/14/2021 4:58:13 PM By: Levan Hurst RN, BSN Entered By: [name] on 03/13/2021 09:12:33 -------------------------------------------------------------------------------- Vitals Details Patient Name: Date of Service: [name], [name] 03/13/2021 9:00 A M Medical Record Number: [id number] Patient Account Number: [id number] Date of Birth/Sex: Treating RN: September 14, 1955 (65 y.o. [name] Primary Care Buel Molder: [name] Other Clinician: Referring Rhen Dossantos: Treating Winter Trefz/Extender: [name] in Treatment: 60 Vital Signs Time Taken: 09:11 Temperature (F): 98.1 Height (in): 68 Pulse (bpm): 63 Respiratory Rate (breaths/min): 20 Blood Pressure (mmHg): 120/73 Reference Range: 80 - 120 mg / dl Electronic Signature(s) Signed: 03/13/2021 1:46:44 PM By: [name] Entered By: [name] on 03/13/2021 09:12:01

## 2021-03-20 ENCOUNTER — Encounter (HOSPITAL_BASED_OUTPATIENT_CLINIC_OR_DEPARTMENT_OTHER): Payer: Medicare Other | Admitting: Physician Assistant

## 2021-03-20 ENCOUNTER — Other Ambulatory Visit: Payer: Self-pay

## 2021-03-20 DIAGNOSIS — I87332 Chronic venous hypertension (idiopathic) with ulcer and inflammation of left lower extremity: Secondary | ICD-10-CM | POA: Diagnosis not present

## 2021-03-20 NOTE — Progress Notes (Addendum)
[name], [name] ([id number]) Visit Report for 03/20/2021 Chief Complaint Document Details Patient Name: Date of Service: [facility] [name], [name] 03/20/2021 10:00 A M Medical Record Number: [id number] Patient Account Number: [id number] Date of Birth/Sex: Treating RN: May 27, 1956 (65 y.o. [name] Primary Care Provider: [name] Other Clinician: Referring Provider: Treating Provider/Extender: [name] in Treatment: 83 Information Obtained from: Patient Chief Complaint patient is been followed long-term in this clinic for venous insufficiency ulcers with inflammation, hypertension and ulceration over the medial ankle bilaterally. 01/17/2020; this is a patient who is here for review of postoperative wounds on the left lateral ankle and recurrence of venous stasis ulceration on the left medial Electronic Signature(s) Signed: 03/20/2021 10:28:15 AM By: [name] PA-C Entered By: [name] on 03/20/2021 10:28:15 -------------------------------------------------------------------------------- Cellular or Tissue Based Product Details Patient Name: Date of Service: [facility] [name], [name]. 03/20/2021 10:00 A M Medical Record Number: [id number] Patient Account Number: [id number] Date of Birth/Sex: Treating RN: 1955/10/15 (65 y.o. [name] Primary Care Provider: [name] Other Clinician: Referring Provider: Treating Provider/Extender: [name] in Treatment: 72 Cellular or Tissue Based Product Type Wound #15 Left,Medial Malleolus Applied to: Performed By: Physician [name], PA Cellular or Tissue Based Product Type: Puraply AM Level of Consciousness (Pre-procedure): Awake and Alert Pre-procedure Verification/Time Out Yes - 10:51 Taken: Location: genitalia / hands / feet / multiple digits Wound Size (sq cm): 0.2 Product Size (sq cm): 12 Waste Size (sq cm): 9 Waste Reason: wound size Amount of  Product Applied (sq cm): 3 Instrument Used: Forceps, Scissors Lot #: [id number] Order #: [id number] Expiration Date: 04/23/2023 Fenestrated: No Reconstituted: Yes Solution Type: normal saline Solution Amount: 3 ml Lot #: [id number] Expiration Date: 03/02/2022 Secured: Yes Secured With: adaptic Dressing Applied: No Procedural Pain: 0 Post Procedural Pain: 0 Response to Treatment: Procedure was tolerated well Level of Consciousness (Post- Awake and Alert procedure): Post Procedure Diagnosis Same as Pre-procedure Electronic Signature(s) Signed: 03/20/2021 4:03:51 PM By: [name] PA-C Signed: 03/20/2021 5:51:35 PM By: Deon Pilling RN, BSN Entered By: Deon Pilling on 03/20/2021 10:58:30 -------------------------------------------------------------------------------- Debridement Details Patient Name: Date of Service: [name], [name]. 03/20/2021 10:00 A M Medical Record Number: [id number] Patient Account Number: [id number] Date of Birth/Sex: Treating RN: 03-Jul-1955 (65 y.o. [name], [name] Primary Care Provider: [name] Other Clinician: Referring Provider: Treating Provider/Extender: [name] in Treatment: 61 Debridement Performed for Assessment: Wound #15 Left,Medial Malleolus Performed By: Physician [name], PA Debridement Type: Debridement Severity of Tissue Pre Debridement: Fat layer exposed Level of Consciousness (Pre-procedure): Awake and Alert Pre-procedure Verification/Time Out Yes - 10:45 Taken: Start Time: 10:46 Pain Control: Lidocaine 4% T opical Solution T Area Debrided (L x W): otal 2 (cm) x 1 (cm) = 2 (cm) Tissue and other material debrided: Viable, Non-Viable, Subcutaneous, Skin: Dermis , Skin: Epidermis Level: Skin/Subcutaneous Tissue Debridement Description: Excisional Instrument: Curette Bleeding: None End Time: 10:50 Procedural Pain: 0 Post Procedural Pain: 0 Response to Treatment:  Procedure was tolerated well Level of Consciousness (Post- Awake and Alert procedure): Post Debridement Measurements of Total Wound Length: (cm) 0.5 Width: (cm) 0.4 Depth: (cm) 0.1 Volume: (cm) 0.016 Character of Wound/Ulcer Post Debridement: Improved Severity of Tissue Post Debridement: Fat layer exposed Post Procedure Diagnosis Same as Pre-procedure Electronic Signature(s) Signed: 03/20/2021 4:03:51 PM By: [name] PA-C Signed: 03/20/2021 5:51:35 PM By: Rolin Barry  [name] RN, BSN Entered By: Deon Pilling on 03/20/2021 10:51:15 -------------------------------------------------------------------------------- HPI Details Patient Name: Date of Service: [name], [name]. 03/20/2021 10:00 A M Medical Record Number: [id number] Patient Account Number: [id number] Date of Birth/Sex: Treating RN: 03-Aug-1955 (65 y.o. [name] Primary Care Provider: [name] Other Clinician: Referring Provider: Treating Provider/Extender: [name] in Treatment: 98 History of Present Illness HPI Description: the remaining wound is over the left medial ankle. Similar wound over the right medial ankle healed largely with use of Apligraf. Most recently we have been using Hydrofera Blue over this wound with considerable improvement. The patient has been extensively worked up in the past for her venous insufficiency and she is not a candidate for antireflux surgery although I have none of the details available currently. 08/24/14; considerable improvement today. About 50% of this wound areas now epithelialized. The base of the wound appears to be healthier granulation.as opposed to last week when she had deteriorated a considerable improvement 08/17/14; unfortunately the wound has regressed somewhat. The areas of epithelialization from the superior aspect are not nearly as healthy as they were last week. The patient thinks her Hydrofera Blue slipped. 09/07/14;  unfortunately the area has markedly regressed in the 2 weeks since I've seen this. There is an odor surrounding erythema. The healthy granulation tissue that we had at the base of the wound now is a dusky color. The nurse reports green drainage 09/14/14; the area looks somewhat better than last week. There is less erythema and less drainage. The culture I did did not show any growth. Nevertheless I think it is better to continue the Cipro and doxycycline for a further week. The remaining wound area was debridement. 09/21/14. Wound did not require debridement last week. Still less erythema and less drainage. She can complete her antibiotics. The areas of epithelialization in the superior aspect of the wound do not look as healthy as they did some weeks ago 10/05/14 continued improvement in the condition of this wound. There is advancing epithelialization. Less aggressive debridement required 10/19/14 continued improvement in the condition and volume of this wound. Less aggressive debridement to the inferior part of this to remove surface slough and fibrinous eschar 11/02/14 no debridement is required. The surface granulation appears healthy although some of her islands of epithelialization seem to have regressed. No evidence of infection 11/16/14; lites surface debridement done of surface eschar. The wound does not look to be unhealthy. No evidence of infection. Unfortunately the patient has had podiatry issues in the right foot and for some reason has redeveloped small surface ulcerations in the medial right ankle. Her original presentation involved wounds in this area 11/23/14 no debridement. The area on the right ankle has enlarged. The left ankle wound appears stable in terms of the surface although there is periwound inflammation. There has been regression in the amount of new skin 11/30/14 no debridement. Both wound areas appear healthy. There was no evidence of infection. The the new area on the right  medial ankle has enlarged although that both the surfaces appear to be stable. 12/07/14; Debridement of the right medial ankle wound. No no debridement was done on the left. 12/14/14 no major change in and now bilateral medial ankle wounds. Both of these are very painful but the no overt evidence of infection. She has had previous venous ablation 12/21/14; patient states that her right medial ankle wound is considerably more painful last week than usual. Her left is also  somewhat painful. She could not tolerate debridement. The right medial ankle wound has fibrinous surface eschar 12/28/14 this is a patient with severe bilateral venous insufficiency ulcers. For a considerable period of time we actually had the one on the right medial ankle healed however this recently opened up again in June. The left medial ankle wound has been a refractory area with some absent flows. We had some success with Hydrofera Blue on this area and it literally closed by 50% however it is recently opened up Foley. Both of these were debridement today of surface eschar. She tolerates this poorly 01/25/15: No change in the status of this. Thick adherent escar. Very poor tolerance of any attempt at debridement. I had healed the right medial malleolus wound for a considerable amount of time and had the left one down to about 50% of the volume although this is totally regressed over the last 48 weeks. Further the right leg has reopened. she is trying to make a appointment with pain and vascular, previous ablations with Dr. Aleda Grana. I do not believe there is an arterial insufficiency issue here 02/01/15 the status of the adherent eschar bilaterally is actually improved. No debridement was done. She did not manage to get vascular studies done 02/08/15 continued debridement of the area was done today. The slough is less adherent and comes off with less pressure. There is no surrounding infection peripheral pulses are intact 02/15/15  selective debridement with a disposable curette. Again the slough is less adherent and comes off with less difficulty. No surrounding infection peripheral pulses are intact. 02/22/15 selective debridement of the right medial ankle wound. Slough comes off with less difficulty. No obvious surrounding infection peripheral pulses are intact I did not debridement the one on the left. Both of these are stable to improved 03/01/15 selective debridement of both wound areas using a curette to. Adherent slough cup soft with less difficulty. No obvious surrounding infection. The patient tells me that 2 days ago she noted a rash above the right leg wrap. She did not have this on her lower legs when she change this over she arrives with widespread left greater than right almost folliculitis-looking rash which is extremely pruritic. I don't see anything to culture here. There is no rash on the rest of her body. She feels well systemically. 03/08/15; selective debridement of both wounds using a curette. Base of this does not look unhealthy. She had limegreen drainage coming out of the left leg wound and describes a lot of drainage. The rash on her left leg looks improved to. No cultures were done. 03/22/15; patient was not here last week. Basal wounds does not look healthy and there is no surrounding erythema. No drainage. There is still a rash on the left leg that almost looks vasculitic however it is clearly limited to the top of where the wrap would be. 04/05/15; on the right required a surgical debridement of surface eschar and necrotic subcutaneous tissue. I did not debridement the area on the left. These continue to be large open wounds that are not changing that much. We were successful at one point in healing the area on the right, and at the same time the area on the left was roughly half the size of current measurements. I think a lot of the deterioration has to do with the prolonged time the patient is on her  feet at work 04/19/15 I attempted-like surface debridement bilaterally she does not tolerate this. She tells me that she was  in allergic care yesterday with extreme pain over her left lateral malleolus/ankle and was told that she has an "sprain" 05/03/15; large bilateral venous insufficiency wounds over the medial malleolus/medial aspect of her ankles. She complains of copious amounts of drainage and his usual large amounts of pain. There is some increasing erythema around the wound on the right extending into the medial aspect of her foot to. historically she came in with these wounds the right one healed and the left one came down to roughly half its current size however the right one is reopened and the left is expanded. 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 05/10/15 large bilateral venous insufficiency wounds. There is less adherence surface left however the surface culture that I did last week grew pseudomonas therefore bilateral selective debridement score necessary. There is surrounding erythema. The patient describes severe bilateral drainage and a lot of pain in the left ankle. Apparently her podiatrist was were ready to do a cortisone shot 05/17/15; the patient complains of pain and again copious amounts of drainage. 05/24/15; we used Iodo flex last week. Patient notes considerable improvement in wound drainage. Only needed to change this once. 05/31/15; we continued Iodoflex; the base of these large wounds bilaterally is not too bad but there is probably likely a significant bioburden here. I would like to debridement just doesn't tolerate it. 06/06/14 I would like to continue the Iodoflex although she still hasn't managed to obtain supplies. She has bilateral medial malleoli or large wounds which are mostly superficial. Both of them are covered circumferentially with some nonviable fibrinous slough although she tolerates debridement very  poorly. She apparently has an appointment for an ablation on the right leg by interventional radiology. 06/14/15; the patient arrives with the wounds and static condition. We attempted a debridement although she does not do well with this secondary to pain. I 07/05/15; wounds are not much smaller however there appears to be a cleaner granulating base. The left has tight fibrinous slough greater than the right. Debridement is tolerated poorly due to pain. Iodoflex is done more for these wounds in any of the multitude of different dressings I have tried on the left 1 and then subsequently the right. 07/12/15; no change in the condition of this wound. I am able to do an aggressive debridement on the right but not the left. She simply cannot tolerate it. We have been using Iodoflex which helps somewhat. It is worthwhile remembering that at one point we healed the right medial ankle wound and the left was about 25% of the current circumference. We have suggested returning to vascular surgery for review of possible further ablations for one reason or another she has not been able to do this. 07/26/15 no major change in the condition of either wound on her medial ankle. I did not attempt to debridement of these. She has been aggressively scrubbing these while she is in the shower at home. She has her supply of Iodoflex which seems to have done more for these wounds then anything I have put on recently. 08/09/15 wound area appears larger although not verified by measurements. Using Iodoflex 09/05/2015 -- she was here for avisit today but had significant problems with the wound and I was asked to see her for a physician opinion. I have summarize that this lady has had surgery on her left lower extremity about 10 years ago where the possible veins stripping  was done. She has had an opinion from interventional radiology around November 2016 where no further sclerotherapy was ordered. The patient works 12 hours a day  and stands on a concrete floor with work boots and is unable to get the proper compression she requires and cannot elevate her limbs appropriately at any given time. She has recently grown Pseudomonas from her wound culture but has not started her ciprofloxacin which was called in for her. 09/13/15 this continues to be a difficult situation for this patient. At one point I had this wound down to a 1.5 x 1.5" wound on her left leg. This is deteriorated and the right leg has reopened. She now has substantial wounds on her medial calcaneus, malleoli and into her lower leg. One on the left has surface eschar but these are far too painful for me to debridement here. She has a vascular surgery appointment next week to see if anything can be done to help here. I think she has had previous ablations several years ago at [facility]. She has no major edema. She tells me that she did not get product last time [facility] Ag] and went for several days without it. She continues to work in work boots 12 hours a day. She cannot get compression/4-layer under her work boots. 09/20/15 no major change. Periwound edema control was not very good. Her point with pain and vascular is next Wednesday the 25th 09/28/15; the patient is seen vascular surgery and is apparently scheduled for repeat duplex ultrasounds of her bilateral lower legs next week. 10/05/15; the patient was seen by Dr. Doren Custard of vascular surgery. He feels that she should have arterial insufficiency excluded as cause/contributed to her nonhealing stage she is therefore booked for an arteriogram. She has apparently monophasic signals in the dorsalis pedis pulses. She also of course has known severe chronic venous insufficiency with previous procedures as noted previously. I had another long discussion with the patient today about her continuing to work 12 hour shifts. I've written her out for 2 months area had concerns about this as her work location is currently  undergoing significant turmoil and this may lead to her termination. She is aware of this however I agree with her that she simply cannot continue to stand for 12 hours multiple days a week with the substantial wound areas she has. 10/19/15; the Dr. Doren Custard appointment was largely for an arteriogram which was normal. She does not have an arterial issue. He didn't make a comment about her chronic venous insufficiency for which she has had previous ablations. Presumably it was not felt that anything additional could be done. The patient is now out of work as I prescribed 2 weeks ago. Her wounds look somewhat less aggravated presumably because of this. I felt I would give debridement another try today 10/25/15; no major change in this patient's wounds. We are struggling to get her product that she can afford into her own home through her insurance. 11/01/15; no major change in the patient's wounds. I have been using silver alginate as the most affordable product. I spoke to Dr. Marla Roe last week with her requested take her to the OR for surgical debridement and placement of ACEL. Dr. Marla Roe told me that she would be willing to do this however [facility] will not cover this, fortunately the patient has [place] healthcare of some variant 11/08/15; no major change in the patient's wounds. She has been completely nonviable surface that this but is in too  much pain with any attempted debridement are clinic. I have arranged for her to see Dr. Marla Roe ham of plastic surgery and this appointment is on Monday. I am hopeful that they will take her to the OR for debridement, possible ACEL ultimately possible skin graft 11/22/15 no major change in the patient's wounds over her bilateral medial calcaneus medial malleolus into the lower legs. Surface on these does not look too bad however on the left there is surrounding erythema and tenderness. This may be cellulitis or could him sleepy tinea. 11/29/15; no  major changes in the patient's wounds over her bilateral medial malleolus. There is no infection here and I don't think any additional antibiotics are necessary. There is now plan to move forward. She sees Dr. Marla Roe in a week's time for preparation for operative debridement and ACEL placement I believe on 7/12. She then has a follow-up appointment with Dr. Marla Roe on 7/21 12/28/15; the patient returns today having been taken to the [place] by Dr. Marla Roe 12/12/15 she underwent debridement, intraoperative cultures [which were negative]. She had placement of a wound VAC. Parent really ACEL was not available to be placed. The wound VAC foam apparently adhered to the wound since then she's been using silver alginate, Xeroform under Ace wraps. She still says there is a lot of drainage and a lot of pain 01/31/16; this is a patient I see monthly. I had referred her to Dr. Marla Roe him of plastic surgery for large wounds on her bilateral medial ankles. She has been to the OR twice once in early July and once in early August. She tells me over the last 3 weeks she has been using the wound VAC with ACEL underneath it. On the right we've simply been using silver alginate. Under Kerlix Coban wraps. 02/28/16; this is a patient I'm currently seeing monthly. She is gone on to have a skin graft over her large venous insufficiency ulcer on the left medial ankle. This was done by Dr. Marla Roe him. The patient is a bit perturbed about why she didn't have one on her right medial ankle wound. She has been using silver alginate to this. 03/06/16; I received a phone call from her plastic surgery Dr. Marla Roe. She expressed some concern about the viability of the skin graft she did on the left medial ankle wound. Asked me to place Endoform on this. She told me she is not planning to do a subsequent skin graft on the right as the left one did not take very well. I had placed Hydrofera Blue on the right 03/13/16;  continue to have a reasonably healthy wound on the right medial ankle. Down to 3 mm in terms of size. There is epithelialization here. The area on the left medial ankle is her skin graft site. I suppose the last week this looks somewhat better. She has an open area inferiorly however in the center there appears to be some viable tissue. There is a lot of surface callus and eschar that will eventually need to come off however none of this looked to be infected. Patient states that the is able to keep the dressing on for several days which is an improvement. 03/20/16 no major change in the circumference of either wound however on the left side the patient was at Dr. Eusebio Friendly office and they did a debridement of left wound. 50% of the wound seems to be epithelialized. I been using Endoform on the left Hydrofera Blue in the right 03/27/16; she arrives today with  her wound is not looking as healthy as they did last week. The area on the right clearly has an adherent surface to this a very similar surface on the left. Unfortunately for this patient this is all too familiar problem. Clearly the Endoform is not working and will need to change that today that has some potential to help this surface. She does not tolerate debridement in this clinic very well. She is changing the dressing wants 04/03/16; patient arrives with the wounds looking somewhat better especially on the right. Dr. Migdalia Dk change the dressing to silver alginate when she saw her on Monday and also sold her some compression socks. The usefulness of the latter is really not clear and woman with severely draining wounds. 04/10/16; the patient is doing a bit of an experiment wearing the compression stockings that Dr. Migdalia Dk provided her to her left leg and the out of legs based dressings that we provided to the right. 05/01/16; the patient is continuing to wear compression stockings Dr. Migdalia Dk provided her on the left that are apparently silver  impregnated. She has been using Iodoflex to the right leg wound. Still a moderate amount of drainage, when she leaves here the wraps only last for 4 days. She has to change the stocking on the left leg every night 05/15/16; she is now using compression stockings bilaterally provided by Dr. Marla Roe. She is wearing a nonadherent layer over the wounds so really I don't think there is anything specific being done to this now. She has some reduction on the left wound. The right is stable. I think all healing here is being done without a specific dressing 06/09/16; patient arrives here today with not much change in the wound certainly in diameter to large circular wounds over the medial aspect of her ankle bilaterally. Under the light of these services are certainly not viable for healing. There is no evidence of surrounding infection. She is wearing compression stockings with some sort of silver impregnation as prescribed by Dr. Marla Roe. She has a follow-up with her tomorrow. 06/30/16; no major change in the size or condition of her wounds. These are still probably covered with a nonviable surface. She is using only her purchase stockings. She did see Dr. Marla Roe who seemed to want to apply Dakin's solution to this I'm not extreme short what value this would be. I would suggest Iodoflex which she still has at home. 07/28/16; I follow [name] episodically along with Dr. Marla Roe. 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 She has been applying a topical collagen ointment to both wounds with Adaptic. I don't think Dr. Marla Roe is planning to take her back to the OR. 08/19/16; I follow [name] on a monthly basis along with Dr. Marla Roe of plastic surgery. 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 I been following her for a number of years. At one point I was  able to get the right medial malleolus wound to heal and had the left medial malleolus down to about half its current size however and I had to send her to plastic surgery for an operative debridement. Since then things have been stable to slightly improve the area on the right is slightly better one in the left about the same although there is much less adherent surface than I'm used to with this patient. She is using some form of liquid collagen gel that  Dr. Marla Roe provided a Kerlix cover with the patient's own pressure stockings. She tells me that she has extreme pain in both ankles and along the lateral aspect of both feet. She has been unable to work for some period of time. She is telling me she is retiring at the beginning of April. She sees Dr. Doran Durand of orthopedics next week 09/22/16; patient has not seen Dr. Marla Roe since the last time she is here. I'm not really sure what she is using to the wounds other than bits and pieces of think she had left over including most recently Hydrofera Blue. She is using juxtalite stockings. She is having difficulty with her husband's recent illness "stroke". She is having to transport him to various doctors appointments. Dr. Marla Roe left her the option of a repeat debridement with ACEL however she has not been able to get the time to follow-up on this. She continues to have a fair amount of drainage out of these wounds with certainly precludes leaving dressings on all week 10/13/16; patient has not seen Dr. Marla Roe since she was last in our clinic. I'm not really sure what she is doing with the wounds, we did try to get her [facility] and I think she is actually using this most of the time. Because of drainage she states she has to change this every second day although this is an improvement from what she used to do. She went to see Dr. Doran Durand who did not think she had a muscular issue with regards to her feet, he referred her to a neurologist  and I think the appointment is sometime in June. I changed her back to Iodoflex which she has used in the past but not recently. 11/03/16; the patient has been using Iodoflex although she ran out of this. Still claims that there is a lot of drainage although the wound does not look like this. No surrounding erythema. She has not been back to see Dr. Marla Roe 11/24/16; the patient has been using Iodoflex again but she ran out of it 2 or 3 days ago. There is no major change in the condition of either one of these wounds in fact they are larger and covered in a thick adherent surface slough/nonviable tissue especially on the left. She does not tolerate mechanical debridement in our clinic. Going back to see Dr. Marla Roe of plastic surgery for an operative debridement would seem reasonable. 12/15/16; the patient has not been back to see Dr. Marla Roe. She is been dealing with a series of illnesses and her husband which of monopolized her time. She is been using Sorbact which we largely supplied. She states the drainage is bad enough that it maximum she can go 2-3 days without changing the dressing 01/12/2017 -- the patient has not been back for about 4 weeks and has not seen Dr. Marla Roe not does she have any appointment pending. 01/23/17; patient has not seen Dr. Marla Roe even though I suggested this previously. She is using Santyl that was suggested last week by Dr. Con Memos this Cost her $16 through her insurance which is indeed surprising 02/12/17; continuing Santyl and the patient is changing this daily. A lot of drainage. She has not been back to see plastic surgery she is using an Ace wrap. Our intake nurse suggested wrap around stockings which would make a good reasonable alternative 02/26/17; patient is been using Santyl and changing this daily due to drainage. She has not been to see plastic surgery she uses in April Ace  wrap to control the edema. She did obtain extremitease stockings but stated  that the edema in her leg was to big for these 03/20/17; patient is using Santyl and Anasept. Surfaces looked better today the area on the right is actually measuring a little smaller. She has states she has a lot of pain in her feet and ankles and is asking for a consult to pain control which I'll try to help her with through our case manager. 04/10/17; the patient arrives with better-looking wound surfaces and is slightly smaller wound on the left she is using a combination of Santyl and Anasept. She has an appointment or at least as started in the pain control center associated with [facility] 05/14/17; this is a patient who I followed for a prolonged period of time. She has venous insufficiency ulcers on her bilateral medial ankles. At one point I had this down to a much smaller wound on the left however these reopened and we've never been able to get these to heal. She has been using Santyl and Anasept gel although 2 weeks ago she ran out of the Anasept gel. She has a stable appearance of the wound. She is going to the wound care clinic at [facility] They wanted do a nerve block/spinal block although she tells me she is reluctant to go forward with that. 05/21/17; this is a patient I have followed for many years. She has venous insufficiency ulcers on her bilateral medial ankles. Chronic pain and deformity in her ankles as well. She is been to see plastic surgery as well as orthopedics. Using PolyMem AG most recently/Kerramax/ABDs and 2 layer compression. She has managed to keep this on and she is coming in for a nurse check to change the dressing on Tuesdays, we see her on Fridays 06/05/17; really quite a good looking surface and the area especially on the right medial has contracted in terms of dimensions. Well granulated healthy-looking tissue on both sides. Even with an open curet there is nothing that even feels abnormal here. This is as good as I've seen this in quite some time.  We have been using PolyMem AG and bringing her in for a nurse check 06/12/17; really quite good surface on both of these wounds. The right medial has contracted a bit left is not. We've been using PolyMem and AG and she is coming in for a nurse visit 06/19/17; we have been using PolyMem AG and bringing her in for a nurse check. Dimensions of her wounds are not better but the surfaces looked better bilaterally. She complained of bleeding last night and the left wound and increasing pain bilaterally. She states her wound pain is more neuropathic than just the wounds. There was some suggestion that this was radicular from her pain management doctor in talking to her it is really difficult to sort this out. 06/26/17; using PolyMem and AG and bringing her in for a nurse check as All of this and reasonably stable condition. Certainly not improved. The dimensions on the lateral part of the right leg look better but not really measuring better. The medial aspect on the left is about the same. 07/03/16; we have been using PolyMen AG and bringing her in for a nurse check to change the dressings as the wounds have drainage which precludes once weekly changing. We are using all secondary absorptive dressings.our intake nurse is brought up the idea of using a wound VAC/snap VAC on the wound to help with the drainage to  see if this would result in some contraction. This is not a bad idea. The area on the right medial is actually looking smaller. Both wounds have a reasonable-looking surface. There is no evidence of cellulitis. The edema is well controlled 07/10/17; the patient was denied for a snap VAC by her insurance. The major issue with these wounds continues to be drainage. We are using wicked PolyMem AG and she is coming in for a nurse visit to change this. The wounds are stable to slightly improved. The surface looks vibrant and the area on the right certainly has shrunk in size but very slowly 07/17/17; the  patient still has large wounds on her bilateral medial malleoli. Surface of both of these wounds looks better. The dimensions seem to come and go but no consistent improvement. There is no epithelialization. We do not have options for advanced treatment products due to insurance issues. They did not approve of the wound VAC to help control the drainage. More recently we've been using PolyMem and AG wicked to allow drainage through. We have been bringing her in for a nurse visit to change this. We do not have a lot of options for wound care products and the home again due to insurance issues 07/24/17; the patient's wound actually looks somewhat better today. No drainage measurements are smaller still healthy-looking surface. We used silver collagen under PolyMen started last week. We have been bringing her in for a dressing change 07/31/17; patient's wound surface continued to look better and I think there is visible change in the dimensions of the wound on the right. Rims of epithelialization. We have been using silver collagen under PolyMen and bringing her in for a dressing change. There appears to be less drainage although she is still in need of the dressing change 08/07/17. Patient's wound surface continues to look better on both sides and the area on the right is definitely smaller. We have been using silver collagen and PolyMen. She feels that the drainage has been it has been better. I asked her about her vascular status. She went to see Dr. Aleda Grana at [facility] and had some form of ablation. I don't have much detail on this. I haven't my notes from 2016 that she was not a candidate for any further ablation but I don't have any more information on this. We had referred her to vein and vascular I don't think she ever went. He does not have a history of PAD although I don't have any information on this either. We don't even have ABIs in our record 08/14/17; we've been using silver collagen and  PolyMen cover. And putting the patient and compression. She we are bringing her in as a nurse visit to change this because ofarge amount of drainage. We didn't the ABIs in clinic today since they had been done in many moons 1.2 bilaterally. She has been to see vein and vascular however this was at [facility] and she had ablation although I really don't have any information on this all seemed biking get a report. She is also been operatively debrided by plastic surgery and had a cell placed probably 8-12 months ago. This didn't have a major effect. We've been making some gains with current dressings 08/19/17-She is here in follow-up evaluation for bilateral medial malleoli ulcers. She continues to tolerate debridement very poorly. We will continue with recently changed topical treatment; if no significant improvement may consider switching to Iodosorb/Iodoflex. 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 She has been using silver collagen and PolyMem. I believe she has been used and tried on Iodoflex before. 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 These have been very refractory over time. We have been using silver collagen and PolyMen. She is coming in once a week for a doctor's and once a week for nurse visits. We are actually making some progress 09/18/17; the patient's wounds are smaller especially on the right medial. She arrives today to upset to consider even washing these off with Anasept which I think is been part of the reason this is been closing. We've been using collagen covered in PolyMen otherwise. It is noted that she has a small area of folliculitis on the right medial calf that. As we are wrapping her legs I'll give her a short course of  doxycycline to make sure this doesn't amount to anything. She is a long list of complaints today including imbalance, shortness of breath on exertion, inversion of her left ankle. With regards to the latter complaints she is been to see orthopedics and they offered her a tendon release surgery I believe but wanted her wounds to be closed first. I have recommended she go see her primary physician with regards to everything else. 09/25/17; patient's wounds are about the same size. We have made some progress bilaterally although not in recent weeks. She will not allow me T wash these wounds with Anasept even if she is doing her cell. Wheeze we've been using collagen covered in PolyMen. Last week she had a small area of folliculitis this is now opened into a small wound. She completed 5 days of trimethoprim sulfamethoxazole 10/02/17; unfortunately the area on her left medial ankle is worse with a larger wound area towards the Achilles. The patient complains of a lot of pain. She will not allow debridement although visually I don't think there is anything to debridement in any case. We have been using silver collagen and PolyMen for several months now. Initially we are making some progress although I'm not really seeing that today. We will move back to [facility]. His admittedly this is a bit of a repeat however I'm hoping that his situation is different now. The patient tells me she had her leg on the left give out on her yesterday this is process some pain. 10/09/17; the patient is seen twice a week largely because of drainage issues coming out of the chronic medial bimalleolar wounds that are chronic. Last week the dimensions of the one on the left looks a little larger I changed her to [facility]. She comes in today with a history of terrible pain in the bilateral wound areas. She will not allow debridement. She will not even allow a tissue culture. There is no surrounding erythema no no evidence of  cellulitis. We have been putting her Kerlix Coban man. She will not allow more aggressive compression as there was a suggestion to put her in 3 layer wraps. 10/16/17; large wounds on her bilateral medial malleoli. These are chronic. Not much change from last week. The surface looks have healthy but absolutely no epithelialization. A lot of pain little less so of drainage. She will not allow debridement or even washing these off in the vigorous fashion with Anasept. 10/23/17; large wounds on  her bilateral malleoli which are chronic. Some improvement in terms of size perhaps on the right since last time I saw these. She states that after we increased the 3 layer compression there was some bleeding, when she came in for a nurse visit she did not want 3 layer compression put back on about our nurse managed to convince her. She has known chronic venous visit issues and I'm hoping to get her to tolerate the 3 layer compression. using Hydrofera Blue 10/30/17; absolutely no change in the condition of either wound although we've had some improvement in dimensions on the right.. Attempted to put her in 3 layer compression she didn't tolerated she is back in 2 layer compression. We've been using Hydrofera Blue We looked over her past records. She had venous reflux studies in November 2016. There was no evidence of deep venous reflux on the right. Superficial vein did not show the greater saphenous vein at think this is been previously ablated the small saphenous vein was within normal limits. The left deep venous system showed no DVT the vessels were positive for deep venous reflux in the posterior tibial veins at the ankle. The greater saphenous vein was surgically absent small saphenous vein was within normal limits. She went to vein and vascular at [facility]. I believe she had an ablation on the left greater saphenous vein. I'll update her reflux studies perhaps ever reviewed by vein and vascular. We've made  absolutely no progress in these wounds. Will also try to read and TheraSkins through her insurance 11/06/17; W the patient apparently has a 2 week follow-up with vein and vascular I like him to review the whole issue with regards to her previous vascular workup by Dr. Aleda Grana. We've really made no progress on these wounds in many months. She arrives today with less viable looking surface on the left medial ankle wound. This was apparently looking about the same on Tuesday when she was here for nurse visit. 11/13/17; deep tissue culture I did last time of the left lower leg showed multiple organisms without any predominating. In particular no Staphylococcus or group A strep were isolated. We sent her for venous reflux studies. She's had a previous left greater saphenous vein stripping and I think sclerotherapy of the right greater saphenous vein. She didn't really look at the lesser saphenous vein this both wounds are on the medial aspect. She has reflux in the common femoral vein and popliteal vein and an accessory vein on the right and the common femoral vein and popliteal vein on the left. I'm going to have her go to see vein and vascular just the look over things and see if anything else beside aggressive compression is indicated here. We have not been able to make any progress on these wounds in spite of the fact that the surface of the wounds is never look too bad. 11/20/17; no major change in the condition of the wounds. Patient reports a large amount of drainage. She has a lot of complaints of pain although enlisting her today I wonder if some of this at least his neuropathic rather than secondary to her wounds. She has an appointment with vein and vascular on 12/30/17. The refractory nature of these wounds in my mind at least need vein and vascular to look over the wounds the recent reflux studies we did and her history to see if anything further can be done here. I also note her gait is  deteriorated quite a bit. Looks like  she has inversion of her foot on the right. She has a bilateral Trendelenburg gait. I wonder if this is neuropathic or perhaps multilevel radicular. 11/27/17; her wounds actually looks slightly better. Healthy-looking granulation tissue a scant amount of epithelialization. [place] healthcare will not pay for [employer]. They will play for tri layer Oasis and Dermagraft. This is not a diabetic ulcer. We'll try for the tri layer Oasis. She still complains of some drainage. She has a vein and vascular appointment on 12/30/17 12/04/17; the wounds visually look quite good. Healthy-looking granulation with some degree of epithelialization. We are still waiting for response to our request for trial to try layer Oasis. Her appointment with vascular to review venous and arterial issues isn't sold the end of July 7/31. Not allow debridement or even vigorous cleansing of the wound surface. 12/18/17; slightly smaller especially on the right. 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 We've been using Hydrofera Blue. We still are looking into triple layer Oasis through her insurance 01/08/18 on evaluation today patient's wound actually appears to be showing signs of good improvement at this point in time. She has been tolerating the dressing changes without complication. Fortunately there does not appear to be any evidence of infection at this point in time. We have been utilizing silver nitrate which does seem to be of benefit for her which is also good news. Overall I'm very happy with how things seem to be both regards appearance as well as measurement. Patient did see Dr. Bridgett Larsson for evaluation on 12/30/17. In his assessment he felt that stripping would not likely add much more than chronic compression to the patient's healing process. His recommendation was to follow-up in three months with Dr. Doren Custard if she hasn't healed in order to consider referral back to  you and see vascular where she previously was in a trial and was able to get her wound to heal. I'll be see what she feels she when you staying compression and he reiterated this as well. 01/13/18 on evaluation today patient appears to actually be doing very well in regard to her bilateral medial malleolus ulcers. She seems to have tolerated the chemical cauterization with silver nitrate last week she did have some pain through that evening but fortunately states that I'll be see since it seems to be doing better she is overall pleased with the progress. 01/21/18; really quite a remarkable improvement since I've last seen these wounds. We started using silver nitrate specially on the islands of hyper granulation which for some reason her around the wound circumference. This is really done quite nicely. Primary dressing Hydrofera Blue under 4 layer compression. She seems to be able to hold out without a nurse rewrap. Follow-up in 1 week 01/28/18; we've continued the hydrofera blue but continued with chemical cauterization to the wound area that we started about a month ago for irregular hyper granulation. She is made almost stunning improvement in the overall wound dimensions. I was not really expecting this degree of improvement in these chronic wounds 02/05/18; we continue with Hydrofera Bluebut of also continued the aggressive chemical cauterization with silver nitrate. We made nice progress with the right greater than left wound. 02/12/18. We continued with Hydrofera Blue after aggressive chemical cauterization with silver nitrate. We appear to be making nice progress with both wound areas 02/19/2018; we continue with [facility] after washing the wounds vigorously with Anasept spray and chemical cauterization with silver nitrate. We are making excellent progress. The area on the right's  just about closed 02/26/2018. The area on the left medial ankle had too much necrotic debris today. I used a #5  curette we are able to get most of the soft. I continued with the silver nitrate to the much smaller wound on the right medial ankle she had a new area on her right lower pretibial area which she says was due to a role in her compression 03/05/2018; both wound areas look healthy. Not much change in dimensions from last week. I continue to use silver nitrate and Hydrofera Blue. The patient saw Dr. Doren Custard of vein and vascular. He felt she had venous stasis ulcers. He felt based on her previous arteriogram she should have adequate circulation for healing. Also she has deep venous reflux but really no significant correctable superficial venous reflux at this time. He felt we should continue with conservative management including leg elevation and compression 04/02/2018; since we last saw this woman about a month ago she had a fall apparently suffered a pelvic fracture. I did not look up the x-ray. Nevertheless because of pain she literally was bedbound for 2 weeks and had home health coming out to change the dressing. Somewhat predictably this is resulted in considerable improvement in both wound areas. The right is just about closed on the medial malleolus and the left is about half the size. 04/16/2018; both her wounds continue to go down in size. Using Hydrofera Blue. 05/07/18; both her wounds appeared to be improving especially on the right where it is almost closed. We are using Hydrofera Blue 05/14/2018; slightly worse this week with larger wounds. Surface on the left medial not quite as good. We have been using Hydrofera Blue 05/21/18; again the wounds are slightly larger. Left medial malleolus slightly larger with eschar around the circumference. We have been using Hydrofera Blue undergoing a wraps for a prolonged period of time. This got a lot better when she was more recumbent due to a fall and a back injury. I change the primary dressing the silver alginate today. She did not tolerate a 4 layer  compression previously although I may need to bring this up with her next time 05/28/2018; area on the left medial malleolus again is slightly larger with more drainage. Area on the right is roughly unchanged. She has a small area of folliculitis on the right medial just on the lower calf. This does not look ominous. 06/03/2018 left medial malleolus slightly smaller in a better looking surface. We used silver nitrate on this last time with silver alginate. The area on the right appears slightly smaller 1/10; left medial malleolus slightly smaller. Small open area on the right. We used silver nitrate and silver alginate as of 2 weeks ago. We continue with the wound and compression. These got a lot better when she was off her feet 1/17; right medial malleolus wound is smaller. The left may be slightly smaller. Both surfaces look somewhat better. 1/24; both wounds are slightly smaller. Using silver alginate under Unna boots 1/31; both wounds appear smaller in fact the area on the right medial is just about closed. Surface eschar. We have been using silver alginate under Unna boots. The patient is less active now spends let much less time on her feet and I think this is contributed to the general improvement in the wound condition 2/7; both wounds appear smaller. I was hopeful the right medial would be closed however there there is still the same small open area. Slight amount of surface eschar on  the left the dimensions are smaller there is eschar but the wound edges appear to be free. We have been using silver alginate under Unna boot's 2/14; both wounds once again measure smaller. Circumferential eschar on the left medial. We have been using silver alginate under Unna boots with gradual improvement 2/21; the area on the right medial malleolus has healed. The area on the left is smaller. We have been using silver alginate and Unna boots. We can discharge wrapping the right leg she has 20/30 stockings at  home she will need to protect the scar tissue in this area 2/28; the area on the right medial malleolus remains closed the patient has a compression stocking. The area on the left is smaller. We have been using silver alginate and Unna boots. 3/6 the area on the right medial ankle remains closed. Good edema control noted she is using her own compression stocking. The area on the left medial ankle is smaller. We have been managing this with silver alginate and Unna boots which we will continue today. 3/13; the area on the right medial ankle remains closed and I'm declaring it healed today. When necessary the left is about the same still a healthy-looking surface but no major change and wound area. No evidence of infection and using silver alginate under unna and generally making considerable improvement 3/27 the area on the right medial ankle remains closed the area on the left is about the same as last week. Certainly not any worse we have been using silver alginate under an Unna boot 4/3; the area on the right medial ankle remains closed per the patient. We did not look at this wound. The wound on the left medial ankle is about the same surface looks healthy we have been using silver alginate under an Unna boot 4/10; area on the right medial ankle remains closed per the patient. We did not look at this wound. The wound on the left medial ankle is slightly larger. The patient complains that the [facility] caused burning pain all week. She also told us that she was a lot more active this week. Changed her back to silver alginate 4/17; right medial ankle still closed per the patient. Left medial ankle is slightly larger. Using silver alginate. She did not tolerate Hydrofera Blue on this area 4/24; right medial ankle remains closed we have not look at this. The left medial ankle continues to get larger today by about a centimeter. We have been using silver alginate under Unna boots. She complains  about 4 layer compression as an alternative. She has been up on her feet working on her garden 5/8; right medial ankle remains closed we did not look at this. The left medial ankle has increased in size about 100%. We have been using silver alginate under Unna boots. She noted increased pain this week and was not surprised that the wound is deteriorated 5/15; no major change in SA however much less erythema ( one week of doxy ocellulitis). 5/22-65 year old female returns at 1 week to the clinic for left medial ankle wound for which we have been using silver alginate under 3 layer compression She was placed on DOXY at last visit - the wound is wider at this visit. She is in 3 layer compression 5/29; change to [facility] last week. I had given her empiric doxycycline 2 weeks ago for a week. She is in 3 layer compression. She complains of a lot of pain and drainage on presentation today. 6/5;  using Hydrofera Blue. I gave her doxycycline recently empirically for erythema and pain around the wound. Believe her cultures showed enterococcus which not would not have been well covered by doxycycline nevertheless the wound looks better and I don't feel specifically that the enterococcus needs to be covered. She has a new what looks like a wrap injury on her lateral left ankle. 6/12; she is using Hydrofera Blue. She has a new area on the left anterior lower tibial area. This was a wrap injury last week. 6/19; the patient is using Hydrofera Blue. She arrived with marked inflammation and erythema around the wound and tenderness. 12/01/18 on evaluation today patient appears to be doing a little bit better based on what I'm hearing from the standpoint of lassos evaluation to this as far as the overall appearance of the wound is concerned. Then sometime substandard she typically sees Dr. Dellia Nims. Nonetheless overall very pleased with the progress that she's made up to this point. No fevers, chills, nausea, or  vomiting noted at this time. 7/10; some improvement in the surface area. Aggressively debrided last week apparently. I went ahead with the debridement today although the patient does not tolerate this very well. We have been using Iodoflex. Still a fair amount of drainage 7/17; slightly smaller. Using Iodoflex. 7/24; no change from last week in terms of surface area. We have been using Iodoflex. Surface looks and continues to look somewhat better 7/31; surface area slightly smaller better looking surface. We have been using Iodoflex. This is under Unna boot compression 8/7-Patient presents at 1 week with Unna boot and Iodoflex, wound appears better 8/14-Patient presents at 1 week with Iodoflex, we use the Unna boot, wound appears to be stable better.Patient is getting Botox treatment for the inversion of the foot for tendon release, Next week 8/21; we are using Iodoflex. Unna boot. The wound is stable in terms of surface area. Under illumination there is some areas of the wound that appear to be either epithelialized or perhaps this is adherent slough at this point I was not really clear. It did not wipe off and I was reluctant to debride this today. 8/28; we are using Iodoflex in an Unna boot. Seems to be making good improvement. 9/4; using Iodoflex and wound is slightly smaller. 9/18; we are using Iodoflex with topical silver nitrate when she is here. The wound continues to be smaller 10/2; patient missed her appointment last week due to GI issues. She left and Iodoflex based dressing on for 2 weeks. Wound is about the same size about the size of a dime on the left medial lower 10/9 we have been using Iodoflex on the medial left ankle wound. She has a new superficial probable wrap injury on the dorsal left ankle 10/16; we have been using Hydrofera Blue since last week. This is on the left medial ankle 10/23; we have been using Hydrofera Blue since 2 weeks ago. This is on the left medial ankle.  Dimensions are better 11/6; using Hydrofera Blue. I think the wound is smaller but still not closed. Left medial ankle 11/13; we have been using Hydrofera Blue. Wound is certainly no smaller this week. Also the surface not as good. This is the remanent of a very large area on her left medial ankle. 11/20; using Sorbact since last week. Wound was about the same in terms of size although I was disappointed about the surface debris 12/11; 3-week follow-up. Patient was on vacation. Wound is measuring slightly larger we have  been using Sorbact. 12/18; wound is about the same size however surface looks better last week after debridement. We have been using Sorbact under compression 1/15 wound is probably twice the size of last time increased in length nonviable surface. We have been using Sorbact. She was running a mild fever and missed her appointment last week 1/22; the wound is come down in size but under illumination still a very adherent debris we have been Hydrofera Blue that I changed her to last week 1/29; dimensions down slightly. We have been using Hydrofera Blue 2/19 dimensions are the same however there is rims of epithelialization under illumination. Therefore more the surface area may be epithelialized 2/26; the patient's wound actually measures smaller. The wound looks healthy. We have been using Hydrofera Blue. I had some thoughts about running Apligraf then I still may do that however this looks so much better this week we will delay that for now 3/5; the wound is small but about the same as last week. We have been using Hydrofera Blue. No debridement is required today. 3/19; the wound is about the size of a dime. Healthy looking wound even under illumination. We have been using Hydrofera Blue. No mechanical debridement is necessary 3/26; not much change from last week although still looks very healthy. We have been using Hydrofera Blue under Unna boots Patient was offered an ankle  fusion by podiatry but not until the wound heals with a proceed with this. 4/9; the patient comes in today with her original wound on the medial ankle looking satisfactory however she has some uncontrolled swelling in the middle part of her leg with 2 new open areas superiorly just lateral to the tibia. I think this was probably a wrap issue. She said she felt uncomfortable during the week but did not call in. We have been using Hydrofera Blue 4/16; the wound on the medial ankle is about the same. She has innumerable small areas superior to this across her mid tibia. I think this is probably folliculitis. She is also been working in the yard doing a lot of sweating 4/30; the patient issue on the upper areas across her mid tibia of all healed. I think this was excessive yard work if I remember. Her wound on the medial ankle is smaller. Some debris on this we have been using Hydrofera Blue under Unna boots 5/7; mid tibia. She has been using Hydrofera Blue under an Unna wrap. She is apparently going for her ankle surgery on June 3 10/28/19-Patient returns to clinic with the ankle wound, we are using Hydrofera Blue under Unna wrap, surgery is scheduled for her left foot for June 3 so she will be back for nurse visit next week READMISSION 01/17/2020 [name] is a 65 year old woman we have had in this clinic for a long period of time with severe venous hypertension and refractory wounds on her medial lower legs and ankles bilaterally. 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 When she was off her legs for a prolonged period example when she fell and suffered a compression fracture things would heal up quite nicely. She is now retired and we managed to heal up the right medial leg wound. The left one was very tiny last time I saw this although still refractory. 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 It got to the point where this was interfering with her walking and she elected to proceed with a ankle arthrodesis to straighten her her ankle and leave her with a functional outcome for mobilization. The patient was referred to Dr. Doren Custard and really this took some time to arrange. Dr. Doren Custard saw her on 12/07/2019. Once again he verified that she had no arterial issues. She had previously had an angiogram several years ago. Follow-up ABIs on the left showed an ABI of 1.12 with triphasic waveforms and a TBI of 0.92. She is felt to have chronic deep venous insufficiency but I do not think it was felt that anything could be done from about this from an ablation point of view. At the time Dr. Doren Custard saw this patient the wounds actually look closed via the pictures in his clinic. The patient finally underwent her surgery on 12/15/2019. This went reasonably well and there was a good anatomic outcome. She developed a small distal wound dehiscence on the lateral part of the surgical wound. However more problematically she is developed recurrence of the wound on the medial left ankle. There are actually 2 wounds here one in the distal lower leg and 1 pretty much at the level of the medial malleolus. It is a more distal area that is more problematic. She has been using Hydrofera Blue which started on Friday before this she was simply Ace wrapping. There was a culture done that showed Pseudomonas and she is on ciprofloxacin. A recent CNS on 8/11 was negative. The patient reports some pain but I generally think this is improving. She is using a cam boot completely nonweightbearing using a walker for pivot transfers and a wheelchair 8/24; not much improvement unfortunately she has a surgical wound on the lateral part in the venous insufficiency wound medially. The bottom part of the medial insufficiency wound  is still necrotic there is exposed tendon here. We have been using Hydrofera Blue under compression. Her edema control is however better 8/31; patient in for follow-up of his surgical wound on the lateral part of her left leg and chronic venous insufficiency ulcers medially. We put her back in compression last week. She comes in today with a complaint of 3 or 4 days worth of increasing pain. She felt her cam walker was rubbing on the area on the back of her heel. However there is intense erythema seems more likely she has cellulitis. She had 2 cultures done when she was seeing podiatry in the postop. One of them in late July showed Pseudomonas and she received a course of ciprofloxacin the other was negative on 8/11 she is allergic to penicillin with anaphylactoid complaints of hives oral swelling via information in epic 9/9; when I saw this patient last week she had intense anterior erythema around her wound on the right lateral heel and ankle and also into the right medial heel. Some of this was no doubt drainage and her walker boot however I was convinced she had cellulitis. I gave her Levaquin and Bactrim she is finishing up on this now. She is following up with Dr. [name] he saw her yesterday. He is taken her out of the walking boot of course she is still nonweightbearing. Her x-ray was negative for any worrisome features such as soft tissue air etc. Things are a lot better this week. She has home health. We have been using Hydrofera Blue under an [employer] which she put back  on yesterday. I did not wrap her last week 9/17; her surrounding skin looks a lot better. In fact the area on the left lateral ankle has just a scant amount of eschar. The only remaining wound is the large area on the left medial ankle. Probably about 60% of this is healthy granulation at the surface however she has a significant divot distally. This has adherent debris in it. I been using debridement and silver collagen to  try and get this area to fill-in although I do not think we have made much progress this week 9/24; the patient's wound on the left medial ankle looks a lot better. The deeper divot area distally still requires debridement but this is cleaning up quite nicely we have been using silver collagen. The patient is complaining of swelling in her foot and is worried that that is contributing to the nonhealing of the ankle wound. She is also complaining of numbness in her anterior toes 10/4; left medial ankle. The small area distally still has a divot with necrotic material that I have been debriding away. This has an undermining area. She is approved for Apligraf. She saw Dr. [name] her surgeon on 10/1. I think he declared himself is satisfied with the condition of things. Still nonweightbearing till the end of the month. 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 Her surgical wound is well closed. There is no evidence of infection 10/11; the patient arrived in clinic today with the expectation that we be able to put an Apligraf on this area after debridement however she arrives with a relatively large amount of green drainage on the dressing. The patient states that this started on Friday. She has not been systemically unwell. 10/19; culture I did last week showed both Enterococcus and Pseudomonas. I think this came in separate parts because I stopped her ciprofloxacin I gave her and prescribed her linezolid however now looking at the final culture result this is Pseudomonas which is resistant to quinolones. She has not yet picked up the linezolid apparently phone issues. We are also trying to get a topical antibiotic out of [place] in [place] they can be applied by home health. She is still having green drainage 10/16; the patient has her topical antibiotic from [facility] in [place]. This is a compounded gel with vancomycin and ciprofloxacin and  gentamicin. We are applying this on the wound bed with silver alginate over the top with Unna boot wraps. She arrives in clinic today with a lot less ominous looking drainage although she is only use this topical preparation once the second time today. She sees Dr. [name] her surgeon on Friday she has home health changing the dressing 11/2; still using her compounded topical antibiotic under silver alginate. Surface is cleaning up there is less drainage. We had an Apligraf for her today and I elected to apply it. A light coating of her antibiotic 04/25/2020 upon evaluation today patient appears to be doing well with regard to her ankle ulcer. There is a little bit of slough noted on the surface of the wound I am can have to perform sharp debridement to clear this away today. With that being said other than that fact overall I feel like she is making progress and we do see some new epithelial growth. There is also some improvement in the depth of the wound and that distal portion. There is little bit of slough there as well. 12/7; 2-week follow-up. Apligraf #3. Dimensions  are smaller. Closing in especially inferiorly. Still some surface debris. Still using the [facility] topical antibiotic but I told her that I don't think this needs to be renewed 12/21; 2-week follow-up. Apligraf #4 dimensions are smaller. Nice improvement 06/05/2020; 2-week follow-up. The patient's wound on the left medial ankle looks really excellent. Nice granulation. Advancing epithelialization no undermining no evidence of infection. We would have to reapply for another Apligraf but with the condition of this wound I did not feel strongly about it. We used Hydrofera Blue under the same degree of compression. She follows up with Dr. [name] her surgeon a week Friday 06/13/2020 upon evaluation today patient appears to be doing excellent in regard to her wound. She has been tolerating the dressing changes without complication. Fortunately  there is no signs of active infection at this time. No fevers, chills, nausea, vomiting, or diarrhea. She was using Hydrofera Blue last week. 06/20/2020 06/20/2020 on evaluation today patient actually appears to be doing excellent in regard to her wound. This is measuring better and looking much better as well. She has been using the collagen that seems to be doing better for her as well even though the [facility] was and is not sticking or feeling as rough on her wound. She did see Dr. [name] on Friday he is very pleased he also stated none of the hardware has shifted. That is great news 1/27; the patient has a small clean wound all that is remaining. I agree that this is too small to really consider an Apligraf. Under illumination the surface is looking quite good. We have been using collagen although the dimensions are not any better this week 2/2; the patient has a small clean wound on the left medial ankle. Although this left of her substantial original areas. Measurements are smaller. We have been using polymen Ag under an Haematologist. 2/10; small area on the left medial ankle. This looks clean nothing to debride however dimensions are about the same we have been using polymen I think now for 2 weeks 2/17; not much change in surface area. We have been using polymen Ag without any improvement. 3/17; 1 month follow-up. The patient has been using endoform without any improvement in fact I think this looks worse with more depth and more expansion 3/24; no improvement. Perhaps less debris on the surface. We have been using Sorbact for 1 week 4/4; wound measures larger. She has edema in her leg and her foot which she tells as her wrap came down. We have been using Unna boots. Sorbact of the wound. She has been approved for Apligraf 09/12/2020 upon evaluation today patient appears to be doing well with regard to her wound. We did get the Apligraf reapproved which is great news we have that available  for application today. Fortunately there is no signs of infection and overall the patient seems to be doing great. The wound bed is nice and clean. 4/27; patient presents for her second application of Apligraf. She states over the past week she has been on her feet more often due to being outside in her garden. She has noted more swelling to her foot as a result. She denies increased warmth, pain or erythema to the wound site. 10/10/2020 upon evaluation today patient appears to be doing well with regard to her wound which does not appear to be quite as irritated as last week from what I am hearing. With that being said unfortunately she is having issues with some erythema and  warmth to touch as well as an increase size. I do believe this likely is infected. 10/17/2020 upon evaluation today patient appears to be doing excellent in regard to her wound this is significantly improved as compared to last week. Fortunately I think that the infection is much better controlled at this point. She did have evidence of both Enterococcus as well as Staphylococcus noted on culture. Enterococcus really would not be helped significantly by the Cipro but the wound is doing so much better I am under the assumption that the Staphylococcus is probably the main organism that is causing the current infection. Nonetheless I think that she is doing excellent as far as that is concerned and I am very pleased in that regard. I would therefore recommend she continue with the Cipro. 10/31/2020 upon evaluation today patient appears to be doing well with regard to her wound. She has been tolerating the dressing changes without complication. Fortunately there is no signs of active infection and overall I am extremely pleased with where things stand today. No fevers, chills, nausea, vomiting, or diarrhea. With that being said she does have some green drainage coming from the wound and although it looks okay I am a little concerned about  the possibility of a continuing infection. Specifically with Pseudomonas. For that reason I will go ahead and send in a prescription for Cipro for her to be continued. 11/14/2020 upon evaluation today patient appears to be doing very well currently in regard to her wound on her leg. She has been tolerating the dressing changes without complication. Fortunately I feel like the infection is finally under good control here. Unfortunately we do not have the Apligraf for application today although we can definitely order to have it in place for next week. That will be her fifth and final of the current series. Nonetheless I feel like her wound is really doing quite well which is great news. 11/21/2020 upon evaluation today patient appears to be doing well with regard to her wound on the medial ankle. Fortunately I think the infection is under control and I do believe we can go ahead and reapply the Apligraf today. She is in agreement with that plan. There does not appear to be any signs of active infection at this time which is great news. No fevers, chills, nausea, vomiting, or diarrhea. 12/05/2020 upon evaluation today patient's wound bed actually showed signs of good granulation epithelization at this point. There does not appear to be any signs of infection which is great news and overall very pleased with where things stand. Overall the patient seems to be doing fairly well in my opinion with regard to her wound although I do believe she continues to build up a lot of biofilm I think she could benefit from using PuraPly at this point. 12/12/2020 upon evaluation today patient's wound actually appears to be doing decently well today. The Unna boot has not been quite as well-tolerated so that more uncomfortable for her and even causing some pressure over the plate on the lateral portion of her foot which is 90 where the wound is. There did not appear to be any significant deep tissue injury with that there may  be a minimal change in the skin noted I think that we may want to go back to the Coflex 2 layer which is a little bit easier on her skin it seems. 12/19/2020 upon evaluation today patient actually seems to be making great progress with the PuraPly currently. She in fact  seems to be much better as far as the overall appearance of the wound bed is concerned I am very happy in this regard. I do not see any signs of of infection which is great news as well. No fevers, chills, nausea, vomiting, or diarrhea. 12/26/2020 upon evaluation today patient appears to actually be doing better in regard to her wound on the left medial ankle region. The surface of the wound is actually doing significantly better which is great news. There does not appear to be any signs of infection which is also great news and in general I am extremely pleased with where we stand today. 01/02/2021 upon evaluation today patient appears to be doing well with regard to her wound. In fact this is showing signs of excellent improvement and very pleased with where things stand. In fact the last 3 appointments have all shown signs of this getting smaller which is excellent news. I have not even had to perform any debridement and today is no exception. Overall I feel like this is dramatically improved compared to previous. T oday is PuraPly application #4. [id number] upon evaluation today patient appears to be doing excellent in regard to her wound this is continue to show signs of improvement and overall I am extremely pleased with where we stand today. She is actually here for PuraPly application #5. Every time we have applied this she is noted definite improvement on measurements. 01/23/2021 upon evaluation today patient is actually making good progress in regard to her wound. This was actually on just a little bit longer this time compared to previous due to the fact that she did have to go out of town. She is actually here for PuraPly  application #6. We have definitely been seeing improvements in the overall quality of the tissue on the surface of the wound which is awesome news. In general I think that the patient seems to be continuing to make great progress here. 01/30/2021 upon evaluation today patient's wound is actually doing excellent. There is really not any significant biofilm buildup which is great news and overall I am extremely pleased with where things stand today. There does not appear to be any signs of active infection. No fevers, chills, nausea, vomiting, or diarrhea. 02/06/2021 upon evaluation today patient's wound is actually showing signs of excellent improvement which is great news. We continue to see the benefit of the PuraPly this is doing a great job the wound seems not really irritated whatsoever and is showing signs of good granulation at this point. Overall I am extremely happy with what we are seeing. The patient likewise is happy to hear all of this as well. 02/13/2021 upon evaluation today patient appears to be doing well with regard to her wound. This again is measuring smaller today and I am very pleased in this regard. Fortunately there does not appear to be any signs of active infection at this time which is good news from a systemic standpoint. Locally there is still some significant drainage which she does have is concerned about infection locally. No fevers, chills, nausea, vomiting, or diarrhea. 02/20/2021 upon evaluation today patient actually appears to be doing decently well in regard to her wound. She has been tolerating the dressing changes without complication. I do believe the PuraPly is helping wound bed does appear to be doing better. There is no evidence of active infection locally or systemically at this point visually although on fluorescence imaging there still appear to be bacterial load and bioburden noted.  02/27/2021 upon evaluation today patient fortunately continues to show signs of  improvement with use of the PuraPly currently. Subsequently we did review her culture results and to be honest I think that the Bactrim is probably the best option to have her continue at this point. For that reason I am get a go ahead and send in a refill today for her for an additional 2 weeks. Nonetheless I think that we are making excellent progress here. It was Enterococcus and Staphylococcus that were noted she is allergic to penicillin so there is not much I can do from the Enterococcus standpoint though the staff does seem to be sensitive to the Bactrim. 03/06/2021 upon evaluation today patient appears to be doing well with regard to her wound. This is definitely showing signs of improvement which is great news and overall I am extremely pleased with where things stand today. There does not appear to be any signs of active infection which is great news and overall and I do believe that we are headed in the appropriate direction I think the PuraPly is doing an awesome job for her. 03/20/2021 upon evaluation today patient actually appears to be doing well with regard to her wound. We have been using the PuraPly although last week when I was out of the office they actually just used endoform nonetheless this still seems to be doing great. Fortunately there does not appear to be any signs of active infection which is great news overall I think the patient is making wonderful progress. Electronic Signature(s) Signed: 03/20/2021 11:02:30 AM By: [name] PA-C Entered By: [name] on 03/20/2021 11:02:29 -------------------------------------------------------------------------------- Physical Exam Details Patient Name: Date of Service: [name], [name]. 03/20/2021 10:00 A M Medical Record Number: [id number] Patient Account Number: [id number] Date of Birth/Sex: Treating RN: April 06, 1956 (65 y.o. [name] Primary Care Provider: [name] Other Clinician: Referring  Provider: Treating Provider/Extender: [name] in Treatment: 32 Constitutional Well-nourished and well-hydrated in no acute distress. Respiratory normal breathing without difficulty. Psychiatric this patient is able to make decisions and demonstrates good insight into disease process. Alert and Oriented x 3. pleasant and cooperative. Notes Upon inspection patient's wound bed showed signs of good granulation and epithelization at this point. Fortunately I think that we are tracking towards getting this completely closed I did go ahead and perform debridement to clear away some of the necrotic debris in preparation for reapplying the PuraPly today she tolerated this today without complication. Post debridement the wound bed appears to be doing much better which is great news. Electronic Signature(s) Signed: 03/20/2021 11:04:22 AM By: [name] PA-C Entered By: [name] on 03/20/2021 11:04:22 -------------------------------------------------------------------------------- Physician Orders Details Patient Name: Date of Service: [name], [name]. 03/20/2021 10:00 A M Medical Record Number: [id number] Patient Account Number: [id number] Date of Birth/Sex: Treating RN: 07/28/1955 (65 y.o. [name], [name] Primary Care Provider: [name] Other Clinician: Referring Provider: Treating Provider/Extender: [name] in Treatment: 30 Verbal / Phone Orders: No Diagnosis Coding ICD-10 Coding Code Description [phone] Chronic venous hypertension (idiopathic) with ulcer and inflammation of left lower extremity L97.828 Non-pressure chronic ulcer of other part of left lower leg with other specified severity L97.328 Non-pressure chronic ulcer of left ankle with other specified severity Follow-up Appointments ppointment in 1 week. Margarita Grizzle Return A Cellular or Product/process development scientist or Tissue Based Product Type: - puraply  AM#13 daptic  or Mepitel. (DO NOT REMOVE). - no Cellular or Tissue Based Product applied to wound bed, secured with steri-strips, cover with A steristrips Bathing/ Shower/ Hygiene May shower with protection but do not get wound dressing(s) wet. Edema Control - Lymphedema / SCD / Other Elevate legs to the level of the heart or above for 30 minutes daily and/or when sitting, a frequency of: - throughout the day Avoid standing for long periods of time. Exercise regularly Compression stocking or Garment 20-30 mm/Hg pressure to: - right leg daily Additional Orders / Instructions Follow Nutritious Diet Wound Treatment Wound #15 - Malleolus Wound Laterality: Left, Medial Cleanser: Soap and Water 1 x Per Week/30 Days Discharge Instructions: May shower and wash wound with dial antibacterial soap and water prior to dressing change. Peri-Wound Care: Triamcinolone 15 ([name]) 1 x Per Week/30 Days Discharge Instructions: Use triamcinolone 15 ([name]) as directed Peri-Wound Care: Sween Lotion (Moisturizing lotion) 1 x Per Week/30 Days Discharge Instructions: Apply moisturizing lotion as directed Prim Dressing: PuraPly AM 1 x Per Week/30 Days ary Discharge Instructions: applied by provider covered with adaptic only. Secondary Dressing: Woven Gauze Sponge, Non-Sterile 4x4 in 1 x Per Week/30 Days Discharge Instructions: to bolster skin sub Secondary Dressing: ABD Pad, 5x9 1 x Per Week/30 Days Discharge Instructions: Apply over primary dressing and lateral ankle Secondary Dressing: Optifoam Non-Adhesive Dressing, 4x4 in 1 x Per Week/30 Days Discharge Instructions: Apply over primary dressing pad lateral foot with foam donut Secondary Dressing: ADAPTIC TOUCH 3x4.25 in 1 x Per Week/30 Days Discharge Instructions: Apply over skin sub Compression Wrap: CoFlex TLC XL 2-layer Compression System 4x7 (in/yd) 1 x Per Week/30 Days Discharge Instructions: Apply CoFlex 2-layer compression as directed. (alt for 4  layer) Electronic Signature(s) Signed: 03/20/2021 4:03:51 PM By: [name] PA-C Signed: 03/20/2021 5:51:35 PM By: Deon Pilling RN, BSN Entered By: Deon Pilling on 03/20/2021 10:58:48 -------------------------------------------------------------------------------- Problem List Details Patient Name: Date of Service: [name], [name]. 03/20/2021 10:00 A M Medical Record Number: [id number] Patient Account Number: [id number] Date of Birth/Sex: Treating RN: [birth year] (65 y.o. 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 03/20/2021 10:00 A M Medical Record Number: [id number] Patient Account Number: [id number] Date of Birth/Sex: Treating RN: 1956-04-13 (65 y.o. [name] Primary Care Provider: [name] Other Clinician: Referring Provider: Treating Provider/Extender: [name] in Treatment: 7 Subjective Chief Complaint Information obtained from Patient patient is been followed long-term in this clinic for venous insufficiency ulcers with inflammation, hypertension and ulceration over the medial ankle bilaterally. 01/17/2020; this is a patient who is here for review of postoperative wounds on the left lateral ankle and recurrence of  venous stasis ulceration on the left medial History of Present Illness (HPI) the remaining wound is over the left medial ankle. Similar wound over the right medial ankle healed largely with use of Apligraf. Most recently we have been using Hydrofera Blue over this wound with considerable improvement. The patient has been extensively worked up in the past for her venous insufficiency and she is not a candidate for antireflux surgery although I have none of the details available currently. 08/24/14; considerable improvement today. About 50% of this wound areas now epithelialized. The base of the wound appears to be healthier granulation.as opposed to last week when she had deteriorated a considerable improvement 08/17/14; unfortunately the wound has regressed somewhat. The areas of epithelialization from the superior aspect are not nearly as healthy as they were last week. The patient thinks her Hydrofera Blue slipped. 09/07/14; unfortunately the area has markedly regressed in the 2 weeks since I've seen this. There is an odor surrounding erythema. The healthy granulation tissue that we had at the base of the wound now is a dusky color. The nurse reports green drainage 09/14/14; the area looks somewhat better than last week. There is less erythema and less drainage. The culture I did did not show any growth. Nevertheless I think it is better to continue the Cipro and doxycycline for a further week. The remaining wound area was debridement. 09/21/14. Wound did not require debridement last week. Still less erythema and  less drainage. She can complete her antibiotics. The areas of epithelialization in the superior aspect of the wound do not look as healthy as they did some weeks ago 10/05/14 continued improvement in the condition of this wound. There is advancing epithelialization. Less aggressive debridement required 10/19/14 continued improvement in the condition and volume of this wound. Less aggressive debridement to the inferior part of this to remove surface slough and fibrinous eschar 11/02/14 no debridement is required. The surface granulation appears healthy although some of her islands of epithelialization seem to have regressed. No evidence of infection 11/16/14; lites surface debridement done of surface eschar. The wound does not look to be unhealthy. No evidence of infection. Unfortunately the patient has had podiatry issues in the right foot and for some reason has redeveloped small surface ulcerations in the medial right ankle. Her original presentation involved wounds in this area 11/23/14 no debridement. The area on the right ankle has enlarged. The left ankle wound appears stable in terms of the surface although there is periwound inflammation. There has been regression in the amount of new skin 11/30/14 no debridement. Both wound areas appear healthy. There was no evidence of infection. The the new area on the right medial ankle has enlarged although that both the surfaces appear to be stable. 12/07/14; Debridement of the right medial ankle wound. No no debridement was done on the left. 12/14/14 no major change in and now bilateral medial ankle wounds. Both of these are very painful but the no overt evidence of infection. She has had previous venous ablation 12/21/14; patient states that her right medial ankle wound is considerably more painful last week than usual. Her left is also somewhat painful. She could not tolerate debridement. The right medial ankle wound has fibrinous surface eschar 12/28/14 this  is a patient with severe bilateral venous insufficiency ulcers. For a considerable period of time we actually had the one on the right medial ankle healed however this recently opened up again in June. The left medial ankle wound has been a refractory  area with some absent flows. We had some success with Hydrofera Blue on this area and it literally closed by 50% however it is recently opened up Foley. Both of these were debridement today of surface eschar. She tolerates this poorly 01/25/15: No change in the status of this. Thick adherent escar. Very poor tolerance of any attempt at debridement. I had healed the right medial malleolus wound for a considerable amount of time and had the left one down to about 50% of the volume although this is totally regressed over the last 48 weeks. Further the right leg has reopened. she is trying to make a appointment with pain and vascular, previous ablations with Dr. Aleda Grana. I do not believe there is an arterial insufficiency issue here 02/01/15 the status of the adherent eschar bilaterally is actually improved. No debridement was done. She did not manage to get vascular studies done 02/08/15 continued debridement of the area was done today. The slough is less adherent and comes off with less pressure. There is no surrounding infection peripheral pulses are intact 02/15/15 selective debridement with a disposable curette. Again the slough is less adherent and comes off with less difficulty. No surrounding infection peripheral pulses are intact. 02/22/15 selective debridement of the right medial ankle wound. Slough comes off with less difficulty. No obvious surrounding infection peripheral pulses are intact I did not debridement the one on the left. Both of these are stable to improved 03/01/15 selective debridement of both wound areas using a curette to. Adherent slough cup soft with less difficulty. No obvious surrounding infection. The patient tells me that 2  days ago she noted a rash above the right leg wrap. She did not have this on her lower legs when she change this over she arrives with widespread left greater than right almost folliculitis-looking rash which is extremely pruritic. I don't see anything to culture here. There is no rash on the rest of her body. She feels well systemically. 03/08/15; selective debridement of both wounds using a curette. Base of this does not look unhealthy. She had limegreen drainage coming out of the left leg wound and describes a lot of drainage. The rash on her left leg looks improved to. No cultures were done. 03/22/15; patient was not here last week. Basal wounds does not look healthy and there is no surrounding erythema. No drainage. There is still a rash on the left leg that almost looks vasculitic however it is clearly limited to the top of where the wrap would be. 04/05/15; on the right required a surgical debridement of surface eschar and necrotic subcutaneous tissue. I did not debridement the area on the left. These continue to be large open wounds that are not changing that much. We were successful at one point in healing the area on the right, and at the same time the area on the left was roughly half the size of current measurements. I think a lot of the deterioration has to do with the prolonged time the patient is on her feet at work 04/19/15 I attempted-like surface debridement bilaterally she does not tolerate this. She tells me that she was in allergic care yesterday with extreme pain over her left lateral malleolus/ankle and was told that she has an "sprain" 05/03/15; large bilateral venous insufficiency wounds over the medial malleolus/medial aspect of her ankles. She complains of copious amounts of drainage and his usual large amounts of pain. There is some increasing erythema around the wound on the right extending into the  medial aspect of her foot to. historically she came in with these wounds the  right one healed and the left one came down to roughly half its current size however the right one is reopened and the left is expanded. 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 05/10/15 large bilateral venous insufficiency wounds. There is less adherence surface left however the surface culture that I did last week grew pseudomonas therefore bilateral selective debridement score necessary. There is surrounding erythema. The patient describes severe bilateral drainage and a lot of pain in the left ankle. Apparently her podiatrist was were ready to do a cortisone shot 05/17/15; the patient complains of pain and again copious amounts of drainage. 05/24/15; we used Iodo flex last week. Patient notes considerable improvement in wound drainage. Only needed to change this once. 05/31/15; we continued Iodoflex; the base of these large wounds bilaterally is not too bad but there is probably likely a significant bioburden here. I would like to debridement just doesn't tolerate it. 06/06/14 I would like to continue the Iodoflex although she still hasn't managed to obtain supplies. She has bilateral medial malleoli or large wounds which are mostly superficial. Both of them are covered circumferentially with some nonviable fibrinous slough although she tolerates debridement very poorly. She apparently has an appointment for an ablation on the right leg by interventional radiology. 06/14/15; the patient arrives with the wounds and static condition. We attempted a debridement although she does not do well with this secondary to pain. I 07/05/15; wounds are not much smaller however there appears to be a cleaner granulating base. The left has tight fibrinous slough greater than the right. Debridement is tolerated poorly due to pain. Iodoflex is done more for these wounds in any of the multitude of different dressings I have tried on the left 1 and then subsequently the  right. 07/12/15; no change in the condition of this wound. I am able to do an aggressive debridement on the right but not the left. She simply cannot tolerate it. We have been using Iodoflex which helps somewhat. It is worthwhile remembering that at one point we healed the right medial ankle wound and the left was about 25% of the current circumference. We have suggested returning to vascular surgery for review of possible further ablations for one reason or another she has not been able to do this. 07/26/15 no major change in the condition of either wound on her medial ankle. I did not attempt to debridement of these. She has been aggressively scrubbing these while she is in the shower at home. She has her supply of Iodoflex which seems to have done more for these wounds then anything I have put on recently. 08/09/15 wound area appears larger although not verified by measurements. Using Iodoflex 09/05/2015 -- she was here for avisit today but had significant problems with the wound and I was asked to see her for a physician opinion. I have summarize that this lady has had surgery on her left lower extremity about 10 years ago where the possible veins stripping was done. She has had an opinion from interventional radiology around November 2016 where no further sclerotherapy was ordered. The patient works 12 hours a day and stands on a concrete floor with work boots and is unable to get the proper compression she requires and cannot elevate her limbs appropriately at any given time. She has recently grown Pseudomonas from  her wound culture but has not started her ciprofloxacin which was called in for her. 09/13/15 this continues to be a difficult situation for this patient. At one point I had this wound down to a 1.5 x 1.5" wound on her left leg. This is deteriorated and the right leg has reopened. She now has substantial wounds on her medial calcaneus, malleoli and into her lower leg. One on the left has  surface eschar but these are far too painful for me to debridement here. She has a vascular surgery appointment next week to see if anything can be done to help here. I think she has had previous ablations several years ago at [facility]. She has no major edema. She tells me that she did not get product last time [facility] Ag] and went for several days without it. She continues to work in work boots 12 hours a day. She cannot get compression/4-layer under her work boots. 09/20/15 no major change. Periwound edema control was not very good. Her point with pain and vascular is next Wednesday the 25th 09/28/15; the patient is seen vascular surgery and is apparently scheduled for repeat duplex ultrasounds of her bilateral lower legs next week. 10/05/15; the patient was seen by Dr. Doren Custard of vascular surgery. He feels that she should have arterial insufficiency excluded as cause/contributed to her nonhealing stage she is therefore booked for an arteriogram. She has apparently monophasic signals in the dorsalis pedis pulses. She also of course has known severe chronic venous insufficiency with previous procedures as noted previously. I had another long discussion with the patient today about her continuing to work 12 hour shifts. I've written her out for 2 months area had concerns about this as her work location is currently undergoing significant turmoil and this may lead to her termination. She is aware of this however I agree with her that she simply cannot continue to stand for 12 hours multiple days a week with the substantial wound areas she has. 10/19/15; the Dr. Doren Custard appointment was largely for an arteriogram which was normal. She does not have an arterial issue. He didn't make a comment about her chronic venous insufficiency for which she has had previous ablations. Presumably it was not felt that anything additional could be done. The patient is now out of work as I prescribed 2 weeks ago. Her wounds  look somewhat less aggravated presumably because of this. I felt I would give debridement another try today 10/25/15; no major change in this patient's wounds. We are struggling to get her product that she can afford into her own home through her insurance. 11/01/15; no major change in the patient's wounds. I have been using silver alginate as the most affordable product. I spoke to Dr. Marla Roe last week with her requested take her to the OR for surgical debridement and placement of ACEL. Dr. Marla Roe told me that she would be willing to do this however [facility] will not cover this, fortunately the patient has [place] healthcare of some variant 11/08/15; no major change in the patient's wounds. She has been completely nonviable surface that this but is in too much pain with any attempted debridement are clinic. I have arranged for her to see Dr. Marla Roe ham of plastic surgery and this appointment is on Monday. I am hopeful that they will take her to the OR for debridement, possible ACEL ultimately possible skin graft 11/22/15 no major change in the patient's wounds over her bilateral medial calcaneus medial malleolus  into the lower legs. Surface on these does not look too bad however on the left there is surrounding erythema and tenderness. This may be cellulitis or could him sleepy tinea. 11/29/15; no major changes in the patient's wounds over her bilateral medial malleolus. There is no infection here and I don't think any additional antibiotics are necessary. There is now plan to move forward. She sees Dr. Marla Roe in a week's time for preparation for operative debridement and ACEL placement I believe on 7/12. She then has a follow-up appointment with Dr. Marla Roe on 7/21 12/28/15; the patient returns today having been taken to the [facility] by Dr. Marla Roe 12/12/15 she underwent debridement, intraoperative cultures [which were negative]. She had placement of a wound VAC. Parent really  ACEL was not available to be placed. The wound VAC foam apparently adhered to the wound since then she's been using silver alginate, Xeroform under Ace wraps. She still says there is a lot of drainage and a lot of pain 01/31/16; this is a patient I see monthly. I had referred her to Dr. Marla Roe him of plastic surgery for large wounds on her bilateral medial ankles. She has been to the OR twice once in early July and once in early August. She tells me over the last 3 weeks she has been using the wound VAC with ACEL underneath it. On the right we've simply been using silver alginate. Under Kerlix Coban wraps. 02/28/16; this is a patient I'm currently seeing monthly. She is gone on to have a skin graft over her large venous insufficiency ulcer on the left medial ankle. This was done by Dr. Marla Roe him. The patient is a bit perturbed about why she didn't have one on her right medial ankle wound. She has been using silver alginate to this. 03/06/16; I received a phone call from her plastic surgery Dr. Marla Roe. She expressed some concern about the viability of the skin graft she did on the left medial ankle wound. Asked me to place Endoform on this. She told me she is not planning to do a subsequent skin graft on the right as the left one did not take very well. I had placed Hydrofera Blue on the right 03/13/16; continue to have a reasonably healthy wound on the right medial ankle. Down to 3 mm in terms of size. There is epithelialization here. The area on the left medial ankle is her skin graft site. I suppose the last week this looks somewhat better. She has an open area inferiorly however in the center there appears to be some viable tissue. There is a lot of surface callus and eschar that will eventually need to come off however none of this looked to be infected. Patient states that the is able to keep the dressing on for several days which is an improvement. 03/20/16 no major change in the  circumference of either wound however on the left side the patient was at Dr. Eusebio Friendly office and they did a debridement of left wound. 50% of the wound seems to be epithelialized. I been using Endoform on the left Hydrofera Blue in the right 03/27/16; she arrives today with her wound is not looking as healthy as they did last week. The area on the right clearly has an adherent surface to this a very similar surface on the left. Unfortunately for this patient this is all too familiar problem. Clearly the Endoform is not working and will need to change that today that has some potential to help this  surface. She does not tolerate debridement in this clinic very well. She is changing the dressing wants 04/03/16; patient arrives with the wounds looking somewhat better especially on the right. Dr. Migdalia Dk change the dressing to silver alginate when she saw her on Monday and also sold her some compression socks. The usefulness of the latter is really not clear and woman with severely draining wounds. 04/10/16; the patient is doing a bit of an experiment wearing the compression stockings that Dr. Migdalia Dk provided her to her left leg and the out of legs based dressings that we provided to the right. 05/01/16; the patient is continuing to wear compression stockings Dr. Migdalia Dk provided her on the left that are apparently silver impregnated. She has been using Iodoflex to the right leg wound. Still a moderate amount of drainage, when she leaves here the wraps only last for 4 days. She has to change the stocking on the left leg every night 05/15/16; she is now using compression stockings bilaterally provided by Dr. Marla Roe. She is wearing a nonadherent layer over the wounds so really I don't think there is anything specific being done to this now. She has some reduction on the left wound. The right is stable. I think all healing here is being done without a specific dressing 06/09/16; patient arrives here today  with not much change in the wound certainly in diameter to large circular wounds over the medial aspect of her ankle bilaterally. Under the light of these services are certainly not viable for healing. There is no evidence of surrounding infection. She is wearing compression stockings with some sort of silver impregnation as prescribed by Dr. Marla Roe. She has a follow-up with her tomorrow. 06/30/16; no major change in the size or condition of her wounds. These are still probably covered with a nonviable surface. She is using only her purchase stockings. She did see Dr. Marla Roe who seemed to want to apply Dakin's solution to this I'm not extreme short what value this would be. I would suggest Iodoflex which she still has at home. 07/28/16; I follow [name] episodically along with Dr. Marla Roe. 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 She has been applying a topical collagen ointment to both wounds with Adaptic. I don't think Dr. Marla Roe is planning to take her back to the OR. 08/19/16; I follow [name] on a monthly basis along with Dr. Marla Roe of plastic surgery. 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 I been following her for a number of years. At one point I was able to get the right medial malleolus wound to heal and had the left medial malleolus down to about half its current size however and I had to send her to plastic surgery for an operative debridement. Since then things have been stable to slightly improve the area on the right is slightly better one in the left about the same although there is much less adherent surface than I'm used to with this patient. She is using some form of liquid collagen gel that Dr. Marla Roe provided a Kerlix cover with the patient's own pressure stockings. She tells me that she has extreme pain in both ankles  and along the lateral aspect of both feet. She has been unable to work for some period of time. She is telling me she is retiring at the beginning of April. She sees Dr. Doran Durand of orthopedics next  week 09/22/16; patient has not seen Dr. Marla Roe since the last time she is here. I'm not really sure what she is using to the wounds other than bits and pieces of think she had left over including most recently Hydrofera Blue. She is using juxtalite stockings. She is having difficulty with her husband's recent illness "stroke". She is having to transport him to various doctors appointments. Dr. Marla Roe left her the option of a repeat debridement with ACEL however she has not been able to get the time to follow-up on this. She continues to have a fair amount of drainage out of these wounds with certainly precludes leaving dressings on all week 10/13/16; patient has not seen Dr. Marla Roe since she was last in our clinic. I'm not really sure what she is doing with the wounds, we did try to get her [facility] and I think she is actually using this most of the time. Because of drainage she states she has to change this every second day although this is an improvement from what she used to do. She went to see Dr. Doran Durand who did not think she had a muscular issue with regards to her feet, he referred her to a neurologist and I think the appointment is sometime in June. I changed her back to Iodoflex which she has used in the past but not recently. 11/03/16; the patient has been using Iodoflex although she ran out of this. Still claims that there is a lot of drainage although the wound does not look like this. No surrounding erythema. She has not been back to see Dr. Marla Roe 11/24/16; the patient has been using Iodoflex again but she ran out of it 2 or 3 days ago. There is no major change in the condition of either one of these wounds in fact they are larger and covered in a thick adherent surface  slough/nonviable tissue especially on the left. She does not tolerate mechanical debridement in our clinic. Going back to see Dr. Marla Roe of plastic surgery for an operative debridement would seem reasonable. 12/15/16; the patient has not been back to see Dr. Marla Roe. She is been dealing with a series of illnesses and her husband which of monopolized her time. She is been using Sorbact which we largely supplied. She states the drainage is bad enough that it maximum she can go 2-3 days without changing the dressing 01/12/2017 -- the patient has not been back for about 4 weeks and has not seen Dr. Marla Roe not does she have any appointment pending. 01/23/17; patient has not seen Dr. Marla Roe even though I suggested this previously. She is using Santyl that was suggested last week by Dr. Con Memos this Cost her $16 through her insurance which is indeed surprising 02/12/17; continuing Santyl and the patient is changing this daily. A lot of drainage. She has not been back to see plastic surgery she is using an Ace wrap. Our intake nurse suggested wrap around stockings which would make a good reasonable alternative 02/26/17; patient is been using Santyl and changing this daily due to drainage. She has not been to see plastic surgery she uses in April Ace wrap to control the edema. She did obtain extremitease stockings but stated that the edema in her leg was to big for these 03/20/17; patient is using Santyl and Anasept. Surfaces looked better today the area on the right is actually measuring a little smaller. She has states she has a lot of pain in her feet and ankles and is  asking for a consult to pain control which I'll try to help her with through our case manager. 04/10/17; the patient arrives with better-looking wound surfaces and is slightly smaller wound on the left she is using a combination of Santyl and Anasept. She has an appointment or at least as started in the pain control center associated  with [facility] 05/14/17; this is a patient who I followed for a prolonged period of time. She has venous insufficiency ulcers on her bilateral medial ankles. At one point I had this down to a much smaller wound on the left however these reopened and we've never been able to get these to heal. She has been using Santyl and Anasept gel although 2 weeks ago she ran out of the Anasept gel. She has a stable appearance of the wound. She is going to the wound care clinic at [facility]. They wanted do a nerve block/spinal block although she tells me she is reluctant to go forward with that. 05/21/17; this is a patient I have followed for many years. She has venous insufficiency ulcers on her bilateral medial ankles. Chronic pain and deformity in her ankles as well. She is been to see plastic surgery as well as orthopedics. Using PolyMem AG most recently/Kerramax/ABDs and 2 layer compression. She has managed to keep this on and she is coming in for a nurse check to change the dressing on Tuesdays, we see her on Fridays 06/05/17; really quite a good looking surface and the area especially on the right medial has contracted in terms of dimensions. Well granulated healthy-looking tissue on both sides. Even with an open curet there is nothing that even feels abnormal here. This is as good as I've seen this in quite some time. We have been using PolyMem AG and bringing her in for a nurse check 06/12/17; really quite good surface on both of these wounds. The right medial has contracted a bit left is not. We've been using PolyMem and AG and she is coming in for a nurse visit 06/19/17; we have been using PolyMem AG and bringing her in for a nurse check. Dimensions of her wounds are not better but the surfaces looked better bilaterally. She complained of bleeding last night and the left wound and increasing pain bilaterally. She states her wound pain is more neuropathic than just the wounds. There was some  suggestion that this was radicular from her pain management doctor in talking to her it is really difficult to sort this out. 06/26/17; using PolyMem and AG and bringing her in for a nurse check as All of this and reasonably stable condition. Certainly not improved. The dimensions on the lateral part of the right leg look better but not really measuring better. The medial aspect on the left is about the same. 07/03/16; we have been using PolyMen AG and bringing her in for a nurse check to change the dressings as the wounds have drainage which precludes once weekly changing. We are using all secondary absorptive dressings.our intake nurse is brought up the idea of using a wound VAC/snap VAC on the wound to help with the drainage to see if this would result in some contraction. This is not a bad idea. The area on the right medial is actually looking smaller. Both wounds have a reasonable-looking surface. There is no evidence of cellulitis. The edema is well controlled 07/10/17; the patient was denied for a snap VAC by her insurance. The major issue with these wounds continues to  be drainage. We are using wicked PolyMem AG and she is coming in for a nurse visit to change this. The wounds are stable to slightly improved. The surface looks vibrant and the area on the right certainly has shrunk in size but very slowly 07/17/17; the patient still has large wounds on her bilateral medial malleoli. Surface of both of these wounds looks better. The dimensions seem to come and go but no consistent improvement. There is no epithelialization. We do not have options for advanced treatment products due to insurance issues. They did not approve of the wound VAC to help control the drainage. More recently we've been using PolyMem and AG wicked to allow drainage through. We have been bringing her in for a nurse visit to change this. We do not have a lot of options for wound care products and the home again due to insurance  issues 07/24/17; the patient's wound actually looks somewhat better today. No drainage measurements are smaller still healthy-looking surface. We used silver collagen under PolyMen started last week. We have been bringing her in for a dressing change 07/31/17; patient's wound surface continued to look better and I think there is visible change in the dimensions of the wound on the right. Rims of epithelialization. We have been using silver collagen under PolyMen and bringing her in for a dressing change. There appears to be less drainage although she is still in need of the dressing change 08/07/17. Patient's wound surface continues to look better on both sides and the area on the right is definitely smaller. We have been using silver collagen and PolyMen. She feels that the drainage has been it has been better. I asked her about her vascular status. She went to see Dr. Aleda Grana at [facility] and had some form of ablation. I don't have much detail on this. I haven't my notes from 2016 that she was not a candidate for any further ablation but I don't have any more information on this. We had referred her to vein and vascular I don't think she ever went. He does not have a history of PAD although I don't have any information on this either. We don't even have ABIs in our record 08/14/17; we've been using silver collagen and PolyMen cover. And putting the patient and compression. She we are bringing her in as a nurse visit to change this because ofarge amount of drainage. We didn't the ABIs in clinic today since they had been done in many moons 1.2 bilaterally. She has been to see vein and vascular however this was at [facility] and she had ablation although I really don't have any information on this all seemed biking get a report. She is also been operatively debrided by plastic surgery and had a cell placed probably 8-12 months ago. This didn't have a major effect. We've been making some gains  with current dressings 08/19/17-She is here in follow-up evaluation for bilateral medial malleoli ulcers. She continues to tolerate debridement very poorly. We will continue with recently changed topical treatment; if no significant improvement may consider switching to Iodosorb/Iodoflex. 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 She has been using silver collagen and PolyMem. I believe she has been used and tried on Iodoflex before. During her trip to the clinic we've been watching her wound with Anasept spray and I would like to encourage this on thenurse visit days 09/04/17 bilateral medial malleoli ulcers area is her chronic related to chronic  venous insufficiency. These have been very refractory over time. We have been using silver collagen and PolyMen. She is coming in once a week for a doctor's and once a week for nurse visits. We are actually making some progress 09/18/17; the patient's wounds are smaller especially on the right medial. She arrives today to upset to consider even washing these off with Anasept which I think is been part of the reason this is been closing. We've been using collagen covered in PolyMen otherwise. It is noted that she has a small area of folliculitis on the right medial calf that. As we are wrapping her legs I'll give her a short course of doxycycline to make sure this doesn't amount to anything. She is a long list of complaints today including imbalance, shortness of breath on exertion, inversion of her left ankle. With regards to the latter complaints she is been to see orthopedics and they offered her a tendon release surgery I believe but wanted her wounds to be closed first. I have recommended she go see her primary physician with regards to everything else. 09/25/17; patient's wounds are about the same size. We have made some progress bilaterally although not in recent weeks. She will not allow me T wash  these wounds with Anasept even if she is doing her cell. Wheeze we've been using collagen covered in PolyMen. Last week she had a small area of folliculitis this is now opened into a small wound. She completed 5 days of trimethoprim sulfamethoxazole 10/02/17; unfortunately the area on her left medial ankle is worse with a larger wound area towards the Achilles. The patient complains of a lot of pain. She will not allow debridement although visually I don't think there is anything to debridement in any case. We have been using silver collagen and PolyMen for several months now. Initially we are making some progress although I'm not really seeing that today. We will move back to [facility]. His admittedly this is a bit of a repeat however I'm hoping that his situation is different now. The patient tells me she had her leg on the left give out on her yesterday this is process some pain. 10/09/17; the patient is seen twice a week largely because of drainage issues coming out of the chronic medial bimalleolar wounds that are chronic. Last week the dimensions of the one on the left looks a little larger I changed her to [facility]. She comes in today with a history of terrible pain in the bilateral wound areas. She will not allow debridement. She will not even allow a tissue culture. There is no surrounding erythema no no evidence of cellulitis. We have been putting her Kerlix Coban man. She will not allow more aggressive compression as there was a suggestion to put her in 3 layer wraps. 10/16/17; large wounds on her bilateral medial malleoli. These are chronic. Not much change from last week. The surface looks have healthy but absolutely no epithelialization. A lot of pain little less so of drainage. She will not allow debridement or even washing these off in the vigorous fashion with Anasept. 10/23/17; large wounds on her bilateral malleoli which are chronic. Some improvement in terms of size perhaps on  the right since last time I saw these. She states that after we increased the 3 layer compression there was some bleeding, when she came in for a nurse visit she did not want 3 layer compression put back on about our nurse managed to convince her.  She has known chronic venous visit issues and I'm hoping to get her to tolerate the 3 layer compression. using Hydrofera Blue 10/30/17; absolutely no change in the condition of either wound although we've had some improvement in dimensions on the right.. Attempted to put her in 3 layer compression she didn't tolerated she is back in 2 layer compression. We've been using Hydrofera Blue We looked over her past records. She had venous reflux studies in November 2016. There was no evidence of deep venous reflux on the right. Superficial vein did not show the greater saphenous vein at think this is been previously ablated the small saphenous vein was within normal limits. The left deep venous system showed no DVT the vessels were positive for deep venous reflux in the posterior tibial veins at the ankle. The greater saphenous vein was surgically absent small saphenous vein was within normal limits. She went to vein and vascular at [facility]. I believe she had an ablation on the left greater saphenous vein. I'll update her reflux studies perhaps ever reviewed by vein and vascular. We've made absolutely no progress in these wounds. Will also try to read and TheraSkins through her insurance 11/06/17; W the patient apparently has a 2 week follow-up with vein and vascular I like him to review the whole issue with regards to her previous vascular workup by Dr. Aleda Grana. We've really made no progress on these wounds in many months. She arrives today with less viable looking surface on the left medial ankle wound. This was apparently looking about the same on Tuesday when she was here for nurse visit. 11/13/17; deep tissue culture I did last time of the left lower  leg showed multiple organisms without any predominating. In particular no Staphylococcus or group A strep were isolated. We sent her for venous reflux studies. She's had a previous left greater saphenous vein stripping and I think sclerotherapy of the right greater saphenous vein. She didn't really look at the lesser saphenous vein this both wounds are on the medial aspect. She has reflux in the common femoral vein and popliteal vein and an accessory vein on the right and the common femoral vein and popliteal vein on the left. I'm going to have her go to see vein and vascular just the look over things and see if anything else beside aggressive compression is indicated here. We have not been able to make any progress on these wounds in spite of the fact that the surface of the wounds is never look too bad. 11/20/17; no major change in the condition of the wounds. Patient reports a large amount of drainage. She has a lot of complaints of pain although enlisting her today I wonder if some of this at least his neuropathic rather than secondary to her wounds. She has an appointment with vein and vascular on 12/30/17. The refractory nature of these wounds in my mind at least need vein and vascular to look over the wounds the recent reflux studies we did and her history to see if anything further can be done here. I also note her gait is deteriorated quite a bit. 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 She has a bilateral Trendelenburg gait. I wonder if this is neuropathic or perhaps multilevel radicular. 11/27/17; her wounds actually looks slightly better. Healthy-looking granulation tissue a scant amount of epithelialization. [place] [facility] will not pay for [employer]. They will play for tri layer Oasis and Dermagraft. This is not a diabetic ulcer.  We'll try for the tri layer Oasis. She still complains of some drainage. She has a vein and vascular appointment on 12/30/17 12/04/17; the wounds  visually look quite good. Healthy-looking granulation with some degree of epithelialization. We are still waiting for response to our request for trial to try layer Oasis. Her appointment with vascular to review venous and arterial issues isn't sold the end of July 7/31. Not allow debridement or even vigorous cleansing of the wound surface. 12/18/17; slightly smaller especially on the right. 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 We've been using Hydrofera Blue. We still are looking into triple layer Oasis through her insurance 01/08/18 on evaluation today patient's wound actually appears to be showing signs of good improvement at this point in time. She has been tolerating the dressing changes without complication. Fortunately there does not appear to be any evidence of infection at this point in time. We have been utilizing silver nitrate which does seem to be of benefit for her which is also good news. Overall I'm very happy with how things seem to be both regards appearance as well as measurement. Patient did see Dr. Bridgett Larsson for evaluation on 12/30/17. In his assessment he felt that stripping would not likely add much more than chronic compression to the patient's healing process. His recommendation was to follow-up in three months with Dr. Doren Custard if she hasn't healed in order to consider referral back to you and see vascular where she previously was in a trial and was able to get her wound to heal. I'll be see what she feels she when you staying compression and he reiterated this as well. 01/13/18 on evaluation today patient appears to actually be doing very well in regard to her bilateral medial malleolus ulcers. She seems to have tolerated the chemical cauterization with silver nitrate last week she did have some pain through that evening but fortunately states that I'll be see since it seems to be doing better she is overall pleased with the progress. 01/21/18; really  quite a remarkable improvement since I've last seen these wounds. We started using silver nitrate specially on the islands of hyper granulation which for some reason her around the wound circumference. This is really done quite nicely. Primary dressing Hydrofera Blue under 4 layer compression. She seems to be able to hold out without a nurse rewrap. Follow-up in 1 week 01/28/18; we've continued the hydrofera blue but continued with chemical cauterization to the wound area that we started about a month ago for irregular hyper granulation. She is made almost stunning improvement in the overall wound dimensions. I was not really expecting this degree of improvement in these chronic wounds 02/05/18; we continue with Hydrofera Bluebut of also continued the aggressive chemical cauterization with silver nitrate. We made nice progress with the right greater than left wound. 02/12/18. We continued with Hydrofera Blue after aggressive chemical cauterization with silver nitrate. We appear to be making nice progress with both wound areas 02/19/2018; we continue with [facility] after washing the wounds vigorously with Anasept spray and chemical cauterization with silver nitrate. We are making excellent progress. The area on the right's just about closed 02/26/2018. The area on the left medial ankle had too much necrotic debris today. I used a #5 curette we are able to get most of the soft. I continued with the silver nitrate to the much smaller wound on the right medial ankle she had a new area on her right lower pretibial area which she says  was due to a role in her compression 03/05/2018; both wound areas look healthy. Not much change in dimensions from last week. I continue to use silver nitrate and Hydrofera Blue. The patient saw Dr. Doren Custard of vein and vascular. He felt she had venous stasis ulcers. He felt based on her previous arteriogram she should have adequate circulation for healing. Also she has deep  venous reflux but really no significant correctable superficial venous reflux at this time. He felt we should continue with conservative management including leg elevation and compression 04/02/2018; since we last saw this woman about a month ago she had a fall apparently suffered a pelvic fracture. I did not look up the x-ray. Nevertheless because of pain she literally was bedbound for 2 weeks and had home health coming out to change the dressing. Somewhat predictably this is resulted in considerable improvement in both wound areas. The right is just about closed on the medial malleolus and the left is about half the size. 04/16/2018; both her wounds continue to go down in size. Using Hydrofera Blue. 05/07/18; both her wounds appeared to be improving especially on the right where it is almost closed. We are using Hydrofera Blue 05/14/2018; slightly worse this week with larger wounds. Surface on the left medial not quite as good. We have been using Hydrofera Blue 05/21/18; again the wounds are slightly larger. Left medial malleolus slightly larger with eschar around the circumference. We have been using Hydrofera Blue undergoing a wraps for a prolonged period of time. This got a lot better when she was more recumbent due to a fall and a back injury. I change the primary dressing the silver alginate today. She did not tolerate a 4 layer compression previously although I may need to bring this up with her next time 05/28/2018; area on the left medial malleolus again is slightly larger with more drainage. Area on the right is roughly unchanged. She has a small area of folliculitis on the right medial just on the lower calf. This does not look ominous. 06/03/2018 left medial malleolus slightly smaller in a better looking surface. We used silver nitrate on this last time with silver alginate. The area on the right appears slightly smaller 1/10; left medial malleolus slightly smaller. Small open area on the  right. We used silver nitrate and silver alginate as of 2 weeks ago. We continue with the wound and compression. These got a lot better when she was off her feet 1/17; right medial malleolus wound is smaller. The left may be slightly smaller. Both surfaces look somewhat better. 1/24; both wounds are slightly smaller. Using silver alginate under Unna boots 1/31; both wounds appear smaller in fact the area on the right medial is just about closed. Surface eschar. We have been using silver alginate under Unna boots. The patient is less active now spends let much less time on her feet and I think this is contributed to the general improvement in the wound condition 2/7; both wounds appear smaller. I was hopeful the right medial would be closed however there there is still the same small open area. Slight amount of surface eschar on the left the dimensions are smaller there is eschar but the wound edges appear to be free. We have been using silver alginate under Unna boot's 2/14; both wounds once again measure smaller. Circumferential eschar on the left medial. We have been using silver alginate under Unna boots with gradual improvement 2/21; the area on the right medial malleolus has healed.  The area on the left is smaller. We have been using silver alginate and Unna boots. We can discharge wrapping the right leg she has 20/30 stockings at home she will need to protect the scar tissue in this area 2/28; the area on the right medial malleolus remains closed the patient has a compression stocking. The area on the left is smaller. We have been using silver alginate and Unna boots. 3/6 the area on the right medial ankle remains closed. Good edema control noted she is using her own compression stocking. The area on the left medial ankle is smaller. We have been managing this with silver alginate and Unna boots which we will continue today. 3/13; the area on the right medial ankle remains closed and I'm  declaring it healed today. When necessary the left is about the same still a healthy-looking surface but no major change and wound area. No evidence of infection and using silver alginate under unna and generally making considerable improvement 3/27 the area on the right medial ankle remains closed the area on the left is about the same as last week. Certainly not any worse we have been using silver alginate under an Unna boot 4/3; the area on the right medial ankle remains closed per the patient. We did not look at this wound. The wound on the left medial ankle is about the same surface looks healthy we have been using silver alginate under an Unna boot 4/10; area on the right medial ankle remains closed per the patient. We did not look at this wound. The wound on the left medial ankle is slightly larger. The patient complains that the [facility] caused burning pain all week. She also told us that she was a lot more active this week. Changed her back to silver alginate 4/17; right medial ankle still closed per the patient. Left medial ankle is slightly larger. Using silver alginate. She did not tolerate Hydrofera Blue on this area 4/24; right medial ankle remains closed we have not look at this. The left medial ankle continues to get larger today by about a centimeter. We have been using silver alginate under Unna boots. She complains about 4 layer compression as an alternative. She has been up on her feet working on her garden 5/8; right medial ankle remains closed we did not look at this. The left medial ankle has increased in size about 100%. We have been using silver alginate under Unna boots. She noted increased pain this week and was not surprised that the wound is deteriorated 5/15; no major change in SA however much less erythema ( one week of doxy ocellulitis). 5/22-65 year old female returns at 1 week to the clinic for left medial ankle wound for which we have been using silver alginate  under 3 layer compression She was placed on DOXY at last visit - the wound is wider at this visit. She is in 3 layer compression 5/29; change to [facility] last week. I had given her empiric doxycycline 2 weeks ago for a week. She is in 3 layer compression. She complains of a lot of pain and drainage on presentation today. 6/5; using Hydrofera Blue. I gave her doxycycline recently empirically for erythema and pain around the wound. Believe her cultures showed enterococcus which not would not have been well covered by doxycycline nevertheless the wound looks better and I don't feel specifically that the enterococcus needs to be covered. She has a new what looks like a wrap injury on her lateral  left ankle. 6/12; she is using Hydrofera Blue. She has a new area on the left anterior lower tibial area. This was a wrap injury last week. 6/19; the patient is using Hydrofera Blue. She arrived with marked inflammation and erythema around the wound and tenderness. 12/01/18 on evaluation today patient appears to be doing a little bit better based on what I'm hearing from the standpoint of lassos evaluation to this as far as the overall appearance of the wound is concerned. Then sometime substandard she typically sees Dr. Dellia Nims. Nonetheless overall very pleased with the progress that she's made up to this point. No fevers, chills, nausea, or vomiting noted at this time. 7/10; some improvement in the surface area. Aggressively debrided last week apparently. I went ahead with the debridement today although the patient does not tolerate this very well. We have been using Iodoflex. Still a fair amount of drainage 7/17; slightly smaller. Using Iodoflex. 7/24; no change from last week in terms of surface area. We have been using Iodoflex. Surface looks and continues to look somewhat better 7/31; surface area slightly smaller better looking surface. We have been using Iodoflex. This is under Unna boot  compression 8/7-Patient presents at 1 week with Unna boot and Iodoflex, wound appears better 8/14-Patient presents at 1 week with Iodoflex, we use the Unna boot, wound appears to be stable better.Patient is getting Botox treatment for the inversion of the foot for tendon release, Next week 8/21; we are using Iodoflex. Unna boot. The wound is stable in terms of surface area. Under illumination there is some areas of the wound that appear to be either epithelialized or perhaps this is adherent slough at this point I was not really clear. It did not wipe off and I was reluctant to debride this today. 8/28; we are using Iodoflex in an Unna boot. Seems to be making good improvement. 9/4; using Iodoflex and wound is slightly smaller. 9/18; we are using Iodoflex with topical silver nitrate when she is here. The wound continues to be smaller 10/2; patient missed her appointment last week due to GI issues. She left and Iodoflex based dressing on for 2 weeks. Wound is about the same size about the size of a dime on the left medial lower 10/9 we have been using Iodoflex on the medial left ankle wound. She has a new superficial probable wrap injury on the dorsal left ankle 10/16; we have been using Hydrofera Blue since last week. This is on the left medial ankle 10/23; we have been using Hydrofera Blue since 2 weeks ago. This is on the left medial ankle. Dimensions are better 11/6; using Hydrofera Blue. I think the wound is smaller but still not closed. Left medial ankle 11/13; we have been using Hydrofera Blue. Wound is certainly no smaller this week. Also the surface not as good. This is the remanent of a very large area on her left medial ankle. 11/20; using Sorbact since last week. Wound was about the same in terms of size although I was disappointed about the surface debris 12/11; 3-week follow-up. Patient was on vacation. Wound is measuring slightly larger we have been using Sorbact. 12/18; wound is  about the same size however surface looks better last week after debridement. We have been using Sorbact under compression 1/15 wound is probably twice the size of last time increased in length nonviable surface. We have been using Sorbact. She was running a mild fever and missed her appointment last week 1/22; the wound is  come down in size but under illumination still a very adherent debris we have been Hydrofera Blue that I changed her to last week 1/29; dimensions down slightly. We have been using Hydrofera Blue 2/19 dimensions are the same however there is rims of epithelialization under illumination. Therefore more the surface area may be epithelialized 2/26; the patient's wound actually measures smaller. The wound looks healthy. We have been using Hydrofera Blue. I had some thoughts about running Apligraf then I still may do that however this looks so much better this week we will delay that for now 3/5; the wound is small but about the same as last week. We have been using Hydrofera Blue. No debridement is required today. 3/19; the wound is about the size of a dime. Healthy looking wound even under illumination. We have been using Hydrofera Blue. No mechanical debridement is necessary 3/26; not much change from last week although still looks very healthy. We have been using Hydrofera Blue under Unna boots Patient was offered an ankle fusion by podiatry but not until the wound heals with a proceed with this. 4/9; the patient comes in today with her original wound on the medial ankle looking satisfactory however she has some uncontrolled swelling in the middle part of her leg with 2 new open areas superiorly just lateral to the tibia. I think this was probably a wrap issue. She said she felt uncomfortable during the week but did not call in. We have been using Hydrofera Blue 4/16; the wound on the medial ankle is about the same. She has innumerable small areas superior to this across her mid  tibia. I think this is probably folliculitis. She is also been working in the yard doing a lot of sweating 4/30; the patient issue on the upper areas across her mid tibia of all healed. I think this was excessive yard work if I remember. Her wound on the medial ankle is smaller. Some debris on this we have been using Hydrofera Blue under Unna boots 5/7; mid tibia. She has been using Hydrofera Blue under an Unna wrap. She is apparently going for her ankle surgery on June 3 10/28/19-Patient returns to clinic with the ankle wound, we are using Hydrofera Blue under Unna wrap, surgery is scheduled for her left foot for June 3 so she will be back for nurse visit next week READMISSION 01/17/2020 [name] is a 65 year old woman we have had in this clinic for a long period of time with severe venous hypertension and refractory wounds on her medial lower legs and ankles bilaterally. 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 When she was off her legs for a prolonged period example when she fell and suffered a compression fracture things would heal up quite nicely. She is now retired and we managed to heal up the right medial leg wound. The left one was very tiny last time I saw this although still refractory. 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 It got to the point where this was interfering with her walking and she elected to proceed with a ankle arthrodesis to straighten her her ankle and leave her with a functional outcome for mobilization. The patient was referred to Dr. Doren Custard and really this took some time to arrange. Dr. Doren Custard saw her  on 12/07/2019. Once again he verified that she had no arterial issues. She had previously had an angiogram several years ago. Follow-up ABIs on the left  showed an ABI of 1.12 with triphasic waveforms and a TBI of 0.92. She is felt to have chronic deep venous insufficiency but I do not think it was felt that anything could be done from about this from an ablation point of view. At the time Dr. Doren Custard saw this patient the wounds actually look closed via the pictures in his clinic. The patient finally underwent her surgery on 12/15/2019. This went reasonably well and there was a good anatomic outcome. She developed a small distal wound dehiscence on the lateral part of the surgical wound. However more problematically she is developed recurrence of the wound on the medial left ankle. There are actually 2 wounds here one in the distal lower leg and 1 pretty much at the level of the medial malleolus. It is a more distal area that is more problematic. She has been using Hydrofera Blue which started on Friday before this she was simply Ace wrapping. There was a culture done that showed Pseudomonas and she is on ciprofloxacin. A recent CNS on 8/11 was negative. The patient reports some pain but I generally think this is improving. She is using a cam boot completely nonweightbearing using a walker for pivot transfers and a wheelchair 8/24; not much improvement unfortunately she has a surgical wound on the lateral part in the venous insufficiency wound medially. The bottom part of the medial insufficiency wound is still necrotic there is exposed tendon here. We have been using Hydrofera Blue under compression. Her edema control is however better 8/31; patient in for follow-up of his surgical wound on the lateral part of her left leg and chronic venous insufficiency ulcers medially. We put her back in compression last week. She comes in today with a complaint of 3 or 4 days worth of increasing pain. She felt her cam walker was rubbing on the area on the back of her heel. However there is intense erythema seems more likely she has cellulitis. She had 2 cultures done  when she was seeing podiatry in the postop. One of them in late July showed Pseudomonas and she received a course of ciprofloxacin the other was negative on 8/11 she is allergic to penicillin with anaphylactoid complaints of hives oral swelling via information in epic 9/9; when I saw this patient last week she had intense anterior erythema around her wound on the right lateral heel and ankle and also into the right medial heel. Some of this was no doubt drainage and her walker boot however I was convinced she had cellulitis. I gave her Levaquin and Bactrim she is finishing up on this now. She is following up with Dr. [name] he saw her yesterday. He is taken her out of the walking boot of course she is still nonweightbearing. Her x-ray was negative for any worrisome features such as soft tissue air etc. Things are a lot better this week. She has home health. We have been using Hydrofera Blue under an [employer] which she put back on yesterday. I did not wrap her last week 9/17; her surrounding skin looks a lot better. In fact the area on the left lateral ankle has just a scant amount of eschar. The only remaining wound is the large area on the left medial ankle. Probably about 60% of this is healthy granulation at the surface however she has a  significant divot distally. This has adherent debris in it. I been using debridement and silver collagen to try and get this area to fill-in although I do not think we have made much progress this week 9/24; the patient's wound on the left medial ankle looks a lot better. The deeper divot area distally still requires debridement but this is cleaning up quite nicely we have been using silver collagen. The patient is complaining of swelling in her foot and is worried that that is contributing to the nonhealing of the ankle wound. She is also complaining of numbness in her anterior toes 10/4; left medial ankle. The small area distally still has a divot with necrotic  material that I have been debriding away. This has an undermining area. She is approved for Apligraf. She saw Dr. [name] her surgeon on 10/1. I think he declared himself is satisfied with the condition of things. Still nonweightbearing till the end of the month. 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 Her surgical wound is well closed. There is no evidence of infection 10/11; the patient arrived in clinic today with the expectation that we be able to put an Apligraf on this area after debridement however she arrives with a relatively large amount of green drainage on the dressing. The patient states that this started on Friday. She has not been systemically unwell. 10/19; culture I did last week showed both Enterococcus and Pseudomonas. I think this came in separate parts because I stopped her ciprofloxacin I gave her and prescribed her linezolid however now looking at the final culture result this is Pseudomonas which is resistant to quinolones. She has not yet picked up the linezolid apparently phone issues. We are also trying to get a topical antibiotic out of [place] in [place] they can be applied by home health. She is still having green drainage 10/16; the patient has her topical antibiotic from [facility] in [place]. This is a compounded gel with vancomycin and ciprofloxacin and gentamicin. We are applying this on the wound bed with silver alginate over the top with Unna boot wraps. She arrives in clinic today with a lot less ominous looking drainage although she is only use this topical preparation once the second time today. She sees Dr. [name] her surgeon on Friday she has home health changing the dressing 11/2; still using her compounded topical antibiotic under silver alginate. Surface is cleaning up there is less drainage. We had an Apligraf for her today and I elected to apply it. A light coating of her antibiotic 04/25/2020 upon  evaluation today patient appears to be doing well with regard to her ankle ulcer. There is a little bit of slough noted on the surface of the wound I am can have to perform sharp debridement to clear this away today. With that being said other than that fact overall I feel like she is making progress and we do see some new epithelial growth. There is also some improvement in the depth of the wound and that distal portion. There is little bit of slough there as well. 12/7; 2-week follow-up. Apligraf #3. Dimensions are smaller. Closing in especially inferiorly. Still some surface debris. Still using the [facility] topical antibiotic but I told her that I don't think this needs to be renewed 12/21; 2-week follow-up. Apligraf #4 dimensions are smaller. Nice improvement 06/05/2020; 2-week follow-up. The patient's wound on the left medial ankle looks really excellent. Nice granulation. Advancing epithelialization no undermining no evidence  of infection. We would have to reapply for another Apligraf but with the condition of this wound I did not feel strongly about it. We used Hydrofera Blue under the same degree of compression. She follows up with Dr. [name] her surgeon a week Friday 06/13/2020 upon evaluation today patient appears to be doing excellent in regard to her wound. She has been tolerating the dressing changes without complication. Fortunately there is no signs of active infection at this time. No fevers, chills, nausea, vomiting, or diarrhea. She was using Hydrofera Blue last week. 06/20/2020 06/20/2020 on evaluation today patient actually appears to be doing excellent in regard to her wound. This is measuring better and looking much better as well. She has been using the collagen that seems to be doing better for her as well even though the [facility] was and is not sticking or feeling as rough on her wound. She did see Dr. [name] on Friday he is very pleased he also stated none of the hardware has  shifted. That is great news 1/27; the patient has a small clean wound all that is remaining. I agree that this is too small to really consider an Apligraf. Under illumination the surface is looking quite good. We have been using collagen although the dimensions are not any better this week 2/2; the patient has a small clean wound on the left medial ankle. Although this left of her substantial original areas. Measurements are smaller. We have been using polymen Ag under an Haematologist. 2/10; small area on the left medial ankle. This looks clean nothing to debride however dimensions are about the same we have been using polymen I think now for 2 weeks 2/17; not much change in surface area. We have been using polymen Ag without any improvement. 3/17; 1 month follow-up. The patient has been using endoform without any improvement in fact I think this looks worse with more depth and more expansion 3/24; no improvement. Perhaps less debris on the surface. We have been using Sorbact for 1 week 4/4; wound measures larger. She has edema in her leg and her foot which she tells as her wrap came down. We have been using Unna boots. Sorbact of the wound. She has been approved for Apligraf 09/12/2020 upon evaluation today patient appears to be doing well with regard to her wound. We did get the Apligraf reapproved which is great news we have that available for application today. Fortunately there is no signs of infection and overall the patient seems to be doing great. The wound bed is nice and clean. 4/27; patient presents for her second application of Apligraf. She states over the past week she has been on her feet more often due to being outside in her garden. She has noted more swelling to her foot as a result. She denies increased warmth, pain or erythema to the wound site. 10/10/2020 upon evaluation today patient appears to be doing well with regard to her wound which does not appear to be quite as irritated as  last week from what I am hearing. With that being said unfortunately she is having issues with some erythema and warmth to touch as well as an increase size. I do believe this likely is infected. 10/17/2020 upon evaluation today patient appears to be doing excellent in regard to her wound this is significantly improved as compared to last week. Fortunately I think that the infection is much better controlled at this point. She did have evidence of both Enterococcus as  well as Staphylococcus noted on culture. Enterococcus really would not be helped significantly by the Cipro but the wound is doing so much better I am under the assumption that the Staphylococcus is probably the main organism that is causing the current infection. Nonetheless I think that she is doing excellent as far as that is concerned and I am very pleased in that regard. I would therefore recommend she continue with the Cipro. 10/31/2020 upon evaluation today patient appears to be doing well with regard to her wound. She has been tolerating the dressing changes without complication. Fortunately there is no signs of active infection and overall I am extremely pleased with where things stand today. No fevers, chills, nausea, vomiting, or diarrhea. With that being said she does have some green drainage coming from the wound and although it looks okay I am a little concerned about the possibility of a continuing infection. Specifically with Pseudomonas. For that reason I will go ahead and send in a prescription for Cipro for her to be continued. 11/14/2020 upon evaluation today patient appears to be doing very well currently in regard to her wound on her leg. She has been tolerating the dressing changes without complication. Fortunately I feel like the infection is finally under good control here. Unfortunately we do not have the Apligraf for application today although we can definitely order to have it in place for next week. That will be  her fifth and final of the current series. Nonetheless I feel like her wound is really doing quite well which is great news. 11/21/2020 upon evaluation today patient appears to be doing well with regard to her wound on the medial ankle. Fortunately I think the infection is under control and I do believe we can go ahead and reapply the Apligraf today. She is in agreement with that plan. There does not appear to be any signs of active infection at this time which is great news. No fevers, chills, nausea, vomiting, or diarrhea. 12/05/2020 upon evaluation today patient's wound bed actually showed signs of good granulation epithelization at this point. There does not appear to be any signs of infection which is great news and overall very pleased with where things stand. Overall the patient seems to be doing fairly well in my opinion with regard to her wound although I do believe she continues to build up a lot of biofilm I think she could benefit from using PuraPly at this point. 12/12/2020 upon evaluation today patient's wound actually appears to be doing decently well today. The Unna boot has not been quite as well-tolerated so that more uncomfortable for her and even causing some pressure over the plate on the lateral portion of her foot which is 90 where the wound is. There did not appear to be any significant deep tissue injury with that there may be a minimal change in the skin noted I think that we may want to go back to the Coflex 2 layer which is a little bit easier on her skin it seems. 12/19/2020 upon evaluation today patient actually seems to be making great progress with the PuraPly currently. She in fact seems to be much better as far as the overall appearance of the wound bed is concerned I am very happy in this regard. I do not see any signs of of infection which is great news as well. No fevers, chills, nausea, vomiting, or diarrhea. 12/26/2020 upon evaluation today patient appears to actually  be doing better in regard to her  wound on the left medial ankle region. The surface of the wound is actually doing significantly better which is great news. There does not appear to be any signs of infection which is also great news and in general I am extremely pleased with where we stand today. 01/02/2021 upon evaluation today patient appears to be doing well with regard to her wound. In fact this is showing signs of excellent improvement and very pleased with where things stand. In fact the last 3 appointments have all shown signs of this getting smaller which is excellent news. I have not even had to perform any debridement and today is no exception. Overall I feel like this is dramatically improved compared to previous. T oday is PuraPly application #4. [id number] upon evaluation today patient appears to be doing excellent in regard to her wound this is continue to show signs of improvement and overall I am extremely pleased with where we stand today. She is actually here for PuraPly application #5. Every time we have applied this she is noted definite improvement on measurements. 01/23/2021 upon evaluation today patient is actually making good progress in regard to her wound. This was actually on just a little bit longer this time compared to previous due to the fact that she did have to go out of town. She is actually here for PuraPly application #6. We have definitely been seeing improvements in the overall quality of the tissue on the surface of the wound which is awesome news. In general I think that the patient seems to be continuing to make great progress here. 01/30/2021 upon evaluation today patient's wound is actually doing excellent. There is really not any significant biofilm buildup which is great news and overall I am extremely pleased with where things stand today. There does not appear to be any signs of active infection. No fevers, chills, nausea, vomiting, or diarrhea. 02/06/2021 upon  evaluation today patient's wound is actually showing signs of excellent improvement which is great news. We continue to see the benefit of the PuraPly this is doing a great job the wound seems not really irritated whatsoever and is showing signs of good granulation at this point. Overall I am extremely happy with what we are seeing. The patient likewise is happy to hear all of this as well. 02/13/2021 upon evaluation today patient appears to be doing well with regard to her wound. This again is measuring smaller today and I am very pleased in this regard. Fortunately there does not appear to be any signs of active infection at this time which is good news from a systemic standpoint. Locally there is still some significant drainage which she does have is concerned about infection locally. No fevers, chills, nausea, vomiting, or diarrhea. 02/20/2021 upon evaluation today patient actually appears to be doing decently well in regard to her wound. She has been tolerating the dressing changes without complication. I do believe the PuraPly is helping wound bed does appear to be doing better. There is no evidence of active infection locally or systemically at this point visually although on fluorescence imaging there still appear to be bacterial load and bioburden noted. 02/27/2021 upon evaluation today patient fortunately continues to show signs of improvement with use of the PuraPly currently. Subsequently we did review her culture results and to be honest I think that the Bactrim is probably the best option to have her continue at this point. For that reason I am get a go ahead and send in a refill today  for her for an additional 2 weeks. Nonetheless I think that we are making excellent progress here. It was Enterococcus and Staphylococcus that were noted she is allergic to penicillin so there is not much I can do from the Enterococcus standpoint though the staff does seem to be sensitive to the  Bactrim. 03/06/2021 upon evaluation today patient appears to be doing well with regard to her wound. This is definitely showing signs of improvement which is great news and overall I am extremely pleased with where things stand today. There does not appear to be any signs of active infection which is great news and overall and I do believe that we are headed in the appropriate direction I think the PuraPly is doing an awesome job for her. 03/20/2021 upon evaluation today patient actually appears to be doing well with regard to her wound. We have been using the PuraPly although last week when I was out of the office they actually just used endoform nonetheless this still seems to be doing great. Fortunately there does not appear to be any signs of active infection which is great news overall I think the patient is making wonderful progress. Objective Constitutional Well-nourished and well-hydrated in no acute distress. Vitals Time Taken: 10:16 AM, Height: 68 in, Temperature: 98.1 F, Pulse: 68 bpm, Respiratory Rate: 20 breaths/min, Blood Pressure: 110/64 mmHg. Respiratory normal breathing without difficulty. Psychiatric this patient is able to make decisions and demonstrates good insight into disease process. Alert and Oriented x 3. pleasant and cooperative. General Notes: Upon inspection patient's wound bed showed signs of good granulation and epithelization at this point. Fortunately I think that we are tracking towards getting this completely closed I did go ahead and perform debridement to clear away some of the necrotic debris in preparation for reapplying the PuraPly today she tolerated this today without complication. Post debridement the wound bed appears to be doing much better which is great news. Integumentary (Hair, Skin) Wound #15 status is Open. Original cause of wound was Gradually Appeared. The date acquired was: 12/30/2019. The wound has been in treatment 61 weeks. The wound is  located on the Left,Medial Malleolus. The wound measures 0.5cm length x 0.4cm width x 0.1cm depth; 0.157cm^2 area and 0.016cm^3 volume. There is Fat Layer (Subcutaneous Tissue) exposed. There is no tunneling or undermining noted. There is a small amount of serosanguineous drainage noted. The wound margin is distinct with the outline attached to the wound base. There is large (67-100%) red, pink, pale granulation within the wound bed. There is no necrotic tissue within the wound bed. 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 There was a Excisional Skin/Subcutaneous Tissue Debridement with a total area of 2 sq cm performed by [name], PA. With the following instrument(s): Curette to remove Viable and Non-Viable tissue/material. Material removed includes Subcutaneous Tissue, Skin: Dermis, and Skin: Epidermis after achieving pain control using Lidocaine 4% T opical Solution. A time out was conducted at 10:45, prior to the start of the procedure. There was no bleeding. The procedure was tolerated well with a pain level of 0 throughout and a pain level of 0 following the procedure.  Post Debridement Measurements: 0.5cm length x 0.4cm width x 0.1cm depth; 0.016cm^3 volume. Character of Wound/Ulcer Post Debridement is improved. Severity of Tissue Post Debridement is: Fat layer exposed. Post procedure Diagnosis Wound #15: Same as Pre-Procedure Pre-procedure diagnosis of Wound #15 is a Venous Leg Ulcer located on the Left,Medial Malleolus. A skin graft procedure using a bioengineered  skin substitute/cellular or tissue based product was performed by [name], PA with the following instrument(s): Forceps and Scissors. Puraply AM was applied and secured with adaptic. 3 sq cm of product was utilized and 9 sq cm was wasted due to wound size. Post Application, no dressing was applied. A Time Out was conducted at 10:51, prior to the start of the procedure. The procedure was tolerated well with a pain level of 0 throughout and a pain level of 0 following the procedure. Post procedure Diagnosis Wound #15: Same as Pre-Procedure . Pre-procedure diagnosis of Wound #15 is a Venous Leg Ulcer located on the Left,Medial Malleolus . There was a Double Layer Compression Therapy Procedure by Deon Pilling, RN. Post procedure Diagnosis Wound #15: Same as Pre-Procedure Plan Follow-up Appointments: Return Appointment in 1 week. Margarita Grizzle Cellular or Tissue Based Products: Cellular or Tissue Based Product Type: - puraply AM#13 Cellular or Tissue Based Product applied to wound bed, secured with steri-strips, cover with Adaptic or Mepitel. (DO NOT REMOVE). - no steristrips Bathing/ Shower/ Hygiene: May shower with protection but do not get wound dressing(s) wet. Edema Control - Lymphedema / SCD / Other: Elevate legs to the level of the heart or above for 30 minutes daily and/or when sitting, a frequency of: - throughout the day Avoid standing for long periods of time. Exercise regularly Compression stocking or Garment 20-30 mm/Hg pressure to: - right leg daily Additional Orders / Instructions: Follow Nutritious Diet WOUND #15: - Malleolus Wound Laterality: Left, Medial Cleanser: Soap and Water 1 x Per Week/30 Days Discharge Instructions: May shower and wash wound with dial antibacterial soap and water prior to dressing change. Peri-Wound Care: Triamcinolone 15 ([name]) 1 x Per Week/30 Days Discharge Instructions: Use triamcinolone 15 ([name]) as directed Peri-Wound Care: Sween Lotion  (Moisturizing lotion) 1 x Per Week/30 Days Discharge Instructions: Apply moisturizing lotion as directed Prim Dressing: PuraPly AM 1 x Per Week/30 Days ary Discharge Instructions: applied by provider covered with adaptic only. Secondary Dressing: Woven Gauze Sponge, Non-Sterile 4x4 in 1 x Per Week/30 Days Discharge Instructions: to bolster skin sub Secondary Dressing: ABD Pad, 5x9 1 x Per Week/30 Days Discharge Instructions: Apply over primary dressing and lateral ankle Secondary Dressing: Optifoam Non-Adhesive Dressing, 4x4 in 1 x Per Week/30 Days Discharge Instructions: Apply over primary dressing pad lateral foot with foam donut Secondary Dressing: ADAPTIC TOUCH 3x4.25 in 1 x Per Week/30 Days Discharge Instructions: Apply over skin sub Com pression Wrap: CoFlex TLC XL 2-layer Compression System 4x7 (in/yd) 1 x Per Week/30 Days Discharge Instructions: Apply CoFlex 2-layer compression as directed. (alt for 4 layer) 1. Would recommend currently that we actually go ahead and continue with the PuraPly I think this is doing a great job for her. Hopefully were getting close to complete resolution. 2. I did put Adaptic over top of this followed by the Coflex 2 layer compression wrap. 3. I am also can recommend at this time that we have the patient continue to monitor for any signs of worsening or infection if anything changes in general she should let me know soon as possible. We will see patient back for  reevaluation in 1 week here in the clinic. If anything worsens or changes patient will contact our office for additional recommendations. Electronic Signature(s) Signed: 03/20/2021 11:05:52 AM By: [name] PA-C Entered By: [name] on 03/20/2021 11:05:52 -------------------------------------------------------------------------------- SuperBill Details Patient Name: Date of Service: [facility] [name], [name] [name]. 03/20/2021 Medical Record Number: [id number] Patient Account Number:  [id number] Date of Birth/Sex: Treating RN: 04/02/1956 (65 y.o. [name], Meta.Reding Primary Care Provider: [name] Other Clinician: Referring Provider: Treating Provider/Extender: [name] in Treatment: 31 Diagnosis Coding ICD-10 Codes Code Description [phone] Chronic venous hypertension (idiopathic) with ulcer and inflammation of left lower extremity L97.828 Non-pressure chronic ulcer of other part of left lower leg with other specified severity L97.328 Non-pressure chronic ulcer of left ankle with other specified severity Facility Procedures CPT4 Code: [id number] Description: [id number] PuraPly AM 3X4 (12sq. cm) enter 12qty Modifier: Quantity: 12 CPT4 Code: [id number] Description: [id number] - SKIN SUB GRAFT FACE/NK/HF/[name] ICD-10 Diagnosis Description L97.828 Non-pressure chronic ulcer of other part of left lower leg with other specified s Modifier: everity Quantity: 1 Physician Procedures : CPT4 Code Description Modifier [id number] - WC PHYS SKIN SUB GRAFT FACE/NK/HF/[name] ICD-10 Diagnosis Description L97.828 Non-pressure chronic ulcer of other part of left lower leg with other specified severity Quantity: 1 Electronic Signature(s) Signed: 03/20/2021 11:07:15 AM By: [name] PA-C Entered By: [name] on 03/20/2021 11:07:15

## 2021-03-20 NOTE — Progress Notes (Signed)
[name], [name] ([id number]) Visit Report for 03/20/2021 Arrival Information Details Patient Name: Date of Service: [name] [name], [name] 03/20/2021 10:00 A M Medical Record Number: [id number] Patient Account Number: [id number] Date of Birth/Sex: Treating RN: April 20, 1956 (65 y.o. [name], [name] Primary Care [name]: [name] Other Clinician: Referring [name]: Treating [name]/Extender: [name] in Treatment: 37 Visit Information History Since Last Visit Added or deleted any medications: No Patient Arrived: Cane Any new allergies or adverse reactions: No Arrival Time: 10:16 Had a fall or experienced change in No Accompanied By: self activities of daily living that may affect Transfer Assistance: None risk of falls: Patient Identification Verified: Yes Signs or symptoms of abuse/neglect since last visito No Secondary Verification Process Completed: Yes Hospitalized since last visit: No Patient Requires Transmission-Based Precautions: No Implantable device outside of the clinic excluding No Patient Has Alerts: Yes cellular tissue based products placed in the center Patient Alerts: L ABI =1.12, TBI = .92 since last visit: R ABI= 1.02, TBI= .58 Has Dressing in Place as Prescribed: Yes Has Compression in Place as Prescribed: Yes Pain Present Now: Yes Electronic Signature(s) Signed: 03/20/2021 5:51:35 PM By: [name] Pilling RN, BSN Entered By: [name] on 03/20/2021 10:17:03 -------------------------------------------------------------------------------- Compression Therapy Details Patient Name: Date of Service: [name], [name]. 03/20/2021 10:00 A M Medical Record Number: [id number] Patient Account Number: [id number] Date of Birth/Sex: Treating RN: August 26, 1955 (65 y.o. [name] Primary Care [name]: [name] Other Clinician: Referring Avyanna Spada: Treating Tashanda Fuhrer/Extender: [name] in Treatment:  74 Compression Therapy Performed for Wound Assessment: Wound #15 Left,Medial Malleolus Performed By: Clinician [name] Pilling, RN Compression Type: Double Layer Post Procedure Diagnosis Same as Pre-procedure Electronic Signature(s) Signed: 03/20/2021 5:51:35 PM By: [name] Pilling RN, BSN Entered By: [name] on 03/20/2021 10:51:37 -------------------------------------------------------------------------------- Encounter Discharge Information Details Patient Name: Date of Service: [name], [name]. 03/20/2021 10:00 A M Medical Record Number: [id number] Patient Account Number: [id number] Date of Birth/Sex: Treating RN: [birth year] (65 y.o. [name] Primary Care [name]: [name] Other Clinician: Referring Jameka Ivie: Treating Dak Szumski/Extender: [name] in Treatment: 68 Encounter Discharge Information Items Post Procedure Vitals Discharge Condition: Stable Temperature (F): 98.1 Ambulatory Status: Cane Pulse (bpm): 68 Discharge Destination: Home Respiratory Rate (breaths/min): 20 Transportation: Private Auto Blood Pressure (mmHg): 110/64 Accompanied By: self Schedule Follow-up Appointment: Yes Clinical Summary of Care: Electronic Signature(s) Signed: 03/20/2021 5:51:35 PM By: [name] Pilling RN, BSN Entered By: [name] on 03/20/2021 11:00:25 -------------------------------------------------------------------------------- Lower Extremity Assessment Details Patient Name: Date of Service: [name], [name]. 03/20/2021 10:00 A M Medical Record Number: [id number] Patient Account Number: [id number] Date of Birth/Sex: Treating RN: Mar 20, 1956 (65 y.o. [name] Primary Care [name]: [name] Other Clinician: Referring [name]: Treating [name]/Extender: [name] in Treatment: 87 [name] Assessment Assessed: [Left: Yes] [Right: No] [name]: [Left: Ye] [Right: s] Calf Left: Right: Point  of Measurement: 31 cm From Medial Instep 27 cm Ankle Left: Right: Point of Measurement: 11 cm From Medial Instep 21.5 cm Vascular Assessment Pulses: Dorsalis Pedis Palpable: [Left:Yes] Electronic Signature(s) Signed: 03/20/2021 5:51:35 PM By: [name] Pilling RN, BSN Entered By: [name] on 03/20/2021 10:34:44 -------------------------------------------------------------------------------- Camp Details Patient Name: Date of Service: [name], [name]. 03/20/2021 10:00 A M Medical Record Number: [id number] Patient Account Number: [id number] Date of Birth/Sex: Treating RN: 1956/02/25 (65 y.o. F) [name]  Primary Care [name]: [name] Other Clinician: Referring [name]: Treating [name]/Extender: [name] in Treatment: 69 Multidisciplinary Care Plan reviewed with physician Active Inactive Venous Leg Ulcer Nursing Diagnoses: Actual venous Insuffiency (use after diagnosis is confirmed) Knowledge deficit related to disease process and management Goals: Patient will maintain optimal [name] control Date Initiated: 06/20/2020 Target Resolution Date: 03/27/2021 Goal Status: Active Interventions: Assess peripheral [name] status every visit. Compression as ordered Treatment Activities: Therapeutic compression applied : 06/20/2020 Notes: 01/30/21: [name], using CoFlex wrap. Wound/Skin Impairment Nursing Diagnoses: Impaired tissue integrity Knowledge deficit related to ulceration/compromised skin integrity Goals: Patient/caregiver will verbalize understanding of skin care regimen Date Initiated: 01/17/2020 Target Resolution Date: 03/27/2021 Goal Status: Active Ulcer/skin breakdown will have a volume reduction of 30% by week 4 Date Initiated: 01/17/2020 Date Inactivated: 03/12/2020 Target Resolution Date: 03/09/2020 Goal Status: Met Interventions: Assess patient/caregiver ability to obtain necessary  supplies Assess patient/caregiver ability to perform ulcer/skin care regimen upon admission and as needed Assess ulceration(s) every visit Provide education on ulcer and skin care Notes: 01/30/21: Wound care regimen continues, PuraPly skin sub being used. Electronic Signature(s) Signed: 03/20/2021 5:51:35 PM By: [name] Pilling RN, BSN Entered By: [name] on 03/20/2021 10:40:53 -------------------------------------------------------------------------------- Pain Assessment Details Patient Name: Date of Service: [name], [name]. 03/20/2021 10:00 A M Medical Record Number: [id number] Patient Account Number: [id number] Date of Birth/Sex: Treating RN: [birth year] (65 y.o. [name] Primary Care Rasheem Figiel: [name] Other Clinician: Referring Easten Maceachern: Treating Willamina Grieshop/Extender: [name] in Treatment: 41 Active Problems Location of Pain Severity and Description of Pain Patient Has Paino Yes Site Locations Pain Location: Pain in Ulcers Rate the pain. Current Pain Level: 3 Pain Management and Medication Current Pain Management: Medication: No Cold Application: No Rest: No Massage: No Activity: No T.E.N.S.: No Heat Application: No Leg drop or elevation: No Is the Current Pain Management Adequate: Adequate How does your wound impact your activities of daily livingo Sleep: No Bathing: No Appetite: No Relationship With Others: No Bladder Continence: No Emotions: No Bowel Continence: No Work: No Toileting: No Drive: No Dressing: No Hobbies: No Engineer, maintenance) Signed: 03/20/2021 5:51:35 PM By: [name] Pilling RN, BSN Entered By: [name] on 03/20/2021 10:17:21 -------------------------------------------------------------------------------- Patient/Caregiver Education Details Patient Name: Date of Service: [name], [name] 10/19/2022andnbsp10:00 A M Medical Record Number: [id number] Patient Account Number:  [id number] Date of Birth/Gender: Treating RN: 01-May-1956 (65 y.o. [name] Primary Care Physician: [name] Other Clinician: Referring Physician: Treating Physician/Extender: [name] in Treatment: 25 Education Assessment Education Provided To: Patient Education Topics Provided Wound/Skin Impairment: Handouts: Skin Care Do's and Dont's Methods: Explain/Verbal Responses: Reinforcements needed Electronic Signature(s) Signed: 03/20/2021 5:51:35 PM By: [name] Pilling RN, BSN Entered By: [name] on 03/20/2021 10:41:09 -------------------------------------------------------------------------------- Wound Assessment Details Patient Name: Date of Service: [name], [name]. 03/20/2021 10:00 A M Medical Record Number: [id number] Patient Account Number: [id number] Date of Birth/Sex: Treating RN: 12-24-1955 (65 y.o. [name], [name] Primary Care Avangeline Stockburger: [name] Other Clinician: Referring Colvin Blatt: Treating Mliss Wedin/Extender: [name] in Treatment: 75 Wound Status Wound Number: 15 Primary Venous Leg Ulcer Etiology: Wound Location: Left, Medial Malleolus Wound Status: Open Wounding Event: Gradually Appeared Comorbid Peripheral Venous Disease, Osteoarthritis, Confinement Date Acquired: 12/30/2019 History: Anxiety Weeks Of Treatment: 61 Clustered Wound: No Photos Wound Measurements Length: (cm) 0.5 Width: (cm) 0.4 Depth: (cm) 0.1 Area: (cm) 0.157 Volume: (  cm) 0.016 % Reduction in Area: 98.7% % Reduction in Volume: 99.8% Epithelialization: Large (67-100%) Tunneling: No Undermining: No Wound Description Classification: Full Thickness With Exposed Support Structures Wound Margin: Distinct, outline attached Exudate Amount: Small Exudate Type: Serosanguineous Exudate Color: red, brown Foul [name] After Cleansing: No Slough/Fibrino Yes Wound Bed Granulation Amount: Large (67-100%) Exposed  Structure Granulation Quality: Red, Pink, Pale Fascia Exposed: No Necrotic Amount: None Present (0%) Fat Layer (Subcutaneous Tissue) Exposed: Yes Tendon Exposed: No Muscle Exposed: No Joint Exposed: No Bone Exposed: No Treatment Notes Wound #15 (Malleolus) Wound Laterality: Left, Medial Cleanser Soap and Water Discharge Instruction: May shower and wash wound with dial antibacterial soap and water prior to dressing change. Peri-Wound Care Triamcinolone 15 ([name]) Discharge Instruction: Use triamcinolone 15 ([name]) as directed Sween Lotion (Moisturizing lotion) Discharge Instruction: Apply moisturizing lotion as directed Topical Primary Dressing PuraPly AM Discharge Instruction: applied by Rayne Loiseau covered with adaptic only. Secondary Dressing Woven Gauze Sponge, Non-Sterile 4x4 in Discharge Instruction: to bolster skin sub ABD Pad, 5x9 Discharge Instruction: Apply over primary dressing and lateral ankle Optifoam Non-Adhesive Dressing, 4x4 in Discharge Instruction: Apply over primary dressing pad lateral foot with foam donut ADAPTIC TOUCH 3x4.25 in Discharge Instruction: Apply over skin sub Secured With Compression Wrap CoFlex TLC XL 2-layer Compression System 4x7 (in/yd) Discharge Instruction: Apply CoFlex 2-layer compression as directed. (alt for 4 layer) Compression Stockings Add-Ons Electronic Signature(s) Signed: 03/20/2021 5:51:35 PM By: [name] Pilling RN, BSN Entered By: [name] on 03/20/2021 10:41:53 -------------------------------------------------------------------------------- Vitals Details Patient Name: Date of Service: [name], [name]. 03/20/2021 10:00 A M Medical Record Number: [id number] Patient Account Number: [id number] Date of Birth/Sex: Treating RN: 1956/03/17 (65 y.o. [name], [name] Primary Care Kiwana Deblasi: [name] Other Clinician: Referring Shonteria Abeln: Treating Leonides Minder/Extender: [name] in Treatment: 36 Vital  Signs Time Taken: 10:16 Temperature (F): 98.1 Height (in): 68 Pulse (bpm): 68 Respiratory Rate (breaths/min): 20 Blood Pressure (mmHg): 110/64 Reference Range: 80 - 120 mg / dl Electronic Signature(s) Signed: 03/20/2021 5:51:35 PM By: [name] Pilling RN, BSN Signed: 03/20/2021 5:51:35 PM By: [name] Pilling RN, BSN Entered By: [name] on 03/20/2021 10:19:05

## 2021-03-22 ENCOUNTER — Ambulatory Visit: Payer: Medicare Other | Admitting: Podiatry

## 2021-03-26 ENCOUNTER — Ambulatory Visit (INDEPENDENT_AMBULATORY_CARE_PROVIDER_SITE_OTHER): Payer: Medicare Other | Admitting: Podiatry

## 2021-03-26 ENCOUNTER — Other Ambulatory Visit: Payer: Self-pay

## 2021-03-26 DIAGNOSIS — L97322 Non-pressure chronic ulcer of left ankle with fat layer exposed: Secondary | ICD-10-CM

## 2021-03-26 DIAGNOSIS — M216X2 Other acquired deformities of left foot: Secondary | ICD-10-CM | POA: Diagnosis not present

## 2021-03-26 DIAGNOSIS — Z79899 Other long term (current) drug therapy: Secondary | ICD-10-CM | POA: Diagnosis not present

## 2021-03-26 MED ORDER — PREGABALIN 75 MG PO CAPS: 75.0000 mg | ORAL_CAPSULE | Freq: Two times a day (BID) | ORAL | 1 refills | Status: DC

## 2021-03-27 ENCOUNTER — Encounter (HOSPITAL_BASED_OUTPATIENT_CLINIC_OR_DEPARTMENT_OTHER): Payer: Medicare Other | Admitting: Physician Assistant

## 2021-03-27 DIAGNOSIS — I87332 Chronic venous hypertension (idiopathic) with ulcer and inflammation of left lower extremity: Secondary | ICD-10-CM | POA: Diagnosis not present

## 2021-03-27 LAB — HEPATIC FUNCTION PANEL
ALT: 10 IU/L (ref 0–32)
AST: 17 IU/L (ref 0–40)
Albumin: 4.4 g/dL (ref 3.8–4.8)
Alkaline Phosphatase: 44 IU/L (ref 44–121)
Bilirubin Total: 0.3 mg/dL (ref 0.0–1.2)
Bilirubin, Direct: <0.1 mg/dL (ref 0.00–0.40)
Total Protein: 6.8 g/dL (ref 6.0–8.5)

## 2021-03-27 NOTE — Progress Notes (Addendum)
HAVA, [place] ([id number]) Visit Report for 03/27/2021 Chief Complaint Document Details Patient Name: Date of Service: [facility] [name], [name] 03/27/2021 10:00 A M Medical Record Number: [id number] Patient Account Number: [id number] Date of Birth/Sex: Treating RN: 08/03/1955 (65 y.o. [name] Provider: [name] Other Clinician: Referring Provider: Treating Provider/Extender: [name] in Treatment: 59 Information Obtained from: Patient Chief Complaint patient is been followed long-term in this clinic for venous insufficiency ulcers with inflammation, hypertension and ulceration over the medial ankle bilaterally. 01/17/2020; this is a patient who is here for review of postoperative wounds on the left lateral ankle and recurrence of venous stasis ulceration on the left medial Electronic Signature(s) Signed: 03/27/2021 10:05:19 AM By: [name] PA-C Entered By: [name] on 03/27/2021 10:05:19 -------------------------------------------------------------------------------- Debridement Details Patient Name: Date of Service: [facility] [name], [name] 03/27/2021 10:00 A M Medical Record Number: [id number] Patient Account Number: [id number] Date of Birth/Sex: Treating RN: [birth year] (65 y.o. [name] Provider: [name] Other Clinician: Referring Provider: Treating Provider/Extender: [name] in Treatment: 17 Debridement Performed for Assessment: Wound #15 Left,Medial Malleolus Performed By: Physician [name], PA Debridement Type: Debridement Severity of Tissue Pre Debridement: Fat layer exposed Level of Consciousness (Pre-procedure): Awake and Alert Pre-procedure Verification/Time Out Yes - 10:40 Taken: Start Time: 10:40 Pain Control: Lidocaine 4% T opical Solution T Area Debrided (L x W): otal 1.4 (cm) x 0.9 (cm) = 1.26 (cm) Tissue and other material  debrided: Non-Viable, Callus, Fibrin/Exudate Level: Non-Viable Tissue Debridement Description: Selective/Open Wound Instrument: Curette Bleeding: None End Time: 10:43 Procedural Pain: 1 Post Procedural Pain: 0 Response to Treatment: Procedure was tolerated well Level of Consciousness (Post- Awake and Alert procedure): Post Debridement Measurements of Total Wound Length: (cm) 0.2 Width: (cm) 0.2 Depth: (cm) 0.1 Volume: (cm) 0.003 Character of Wound/Ulcer Post Debridement: Improved Severity of Tissue Post Debridement: Fat layer exposed Post Procedure Diagnosis Same as Pre-procedure Electronic Signature(s) Signed: 03/27/2021 4:55:06 PM By: [name] PA-C Signed: 03/27/2021 5:09:33 PM By: Baruch Gouty RN, BSN Entered By: Baruch Gouty on 03/27/2021 10:43:43 -------------------------------------------------------------------------------- HPI Details Patient Name: Date of Service: [name], [name] 03/27/2021 10:00 A M Medical Record Number: [id number] Patient Account Number: [id number] Date of Birth/Sex: Treating RN: 1956-04-01 (65 y.o. [name] Provider: [name] Other Clinician: Referring Provider: Treating Provider/Extender: [name] in Treatment: 88 History of Present Illness HPI Description: the remaining wound is over the left medial ankle. Similar wound over the right medial ankle healed largely with use of Apligraf. Most recently we have been using Hydrofera Blue over this wound with considerable improvement. The patient has been extensively worked up in the past for her venous insufficiency and she is not a candidate for antireflux surgery although I have none of the details available currently. 08/24/14; considerable improvement today. About 50% of this wound areas now epithelialized. The base of the wound appears to be healthier granulation.as opposed to last week when she had deteriorated a  considerable improvement 08/17/14; unfortunately the wound has regressed somewhat. The areas of epithelialization from the superior aspect are not nearly as healthy as they were last week. The patient thinks her Hydrofera Blue slipped. 09/07/14; unfortunately the area has markedly regressed in the 2 weeks since I've seen this. There is an odor surrounding erythema. The healthy granulation tissue that we had at the  base of the wound now is a dusky color. The nurse reports green drainage 09/14/14; the area looks somewhat better than last week. There is less erythema and less drainage. The culture I did did not show any growth. Nevertheless I think it is better to continue the Cipro and doxycycline for a further week. The remaining wound area was debridement. 09/21/14. Wound did not require debridement last week. Still less erythema and less drainage. She can complete her antibiotics. The areas of epithelialization in the superior aspect of the wound do not look as healthy as they did some weeks ago 10/05/14 continued improvement in the condition of this wound. There is advancing epithelialization. Less aggressive debridement required 10/19/14 continued improvement in the condition and volume of this wound. Less aggressive debridement to the inferior part of this to remove surface slough and fibrinous eschar 11/02/14 no debridement is required. The surface granulation appears healthy although some of her islands of epithelialization seem to have regressed. No evidence of infection 11/16/14; lites surface debridement done of surface eschar. The wound does not look to be unhealthy. No evidence of infection. Unfortunately the patient has had podiatry issues in the right foot and for some reason has redeveloped small surface ulcerations in the medial right ankle. Her original presentation involved wounds in this area 11/23/14 no debridement. The area on the right ankle has enlarged. The left ankle wound appears stable  in terms of the surface although there is periwound inflammation. There has been regression in the amount of new skin 11/30/14 no debridement. Both wound areas appear healthy. There was no evidence of infection. The the new area on the right medial ankle has enlarged although that both the surfaces appear to be stable. 12/07/14; Debridement of the right medial ankle wound. No no debridement was done on the left. 12/14/14 no major change in and now bilateral medial ankle wounds. Both of these are very painful but the no overt evidence of infection. She has had previous venous ablation 12/21/14; patient states that her right medial ankle wound is considerably more painful last week than usual. Her left is also somewhat painful. She could not tolerate debridement. The right medial ankle wound has fibrinous surface eschar 12/28/14 this is a patient with severe bilateral venous insufficiency ulcers. For a considerable period of time we actually had the one on the right medial ankle healed however this recently opened up again in June. The left medial ankle wound has been a refractory area with some absent flows. We had some success with Hydrofera Blue on this area and it literally closed by 50% however it is recently opened up Foley. Both of these were debridement today of surface eschar. She tolerates this poorly 01/25/15: No change in the status of this. Thick adherent escar. Very poor tolerance of any attempt at debridement. I had healed the right medial malleolus wound for a considerable amount of time and had the left one down to about 50% of the volume although this is totally regressed over the last 48 weeks. Further the right leg has reopened. she is trying to make a appointment with pain and vascular, previous ablations with Dr. Aleda Grana. I do not believe there is an arterial insufficiency issue here 02/01/15 the status of the adherent eschar bilaterally is actually improved. No debridement was done.  She did not manage to get vascular studies done 02/08/15 continued debridement of the area was done today. The slough is less adherent and comes off with less pressure. There is  no surrounding infection peripheral pulses are intact 02/15/15 selective debridement with a disposable curette. Again the slough is less adherent and comes off with less difficulty. No surrounding infection peripheral pulses are intact. 02/22/15 selective debridement of the right medial ankle wound. Slough comes off with less difficulty. No obvious surrounding infection peripheral pulses are intact I did not debridement the one on the left. Both of these are stable to improved 03/01/15 selective debridement of both wound areas using a curette to. Adherent slough cup soft with less difficulty. No obvious surrounding infection. The patient tells me that 2 days ago she noted a rash above the right leg wrap. She did not have this on her lower legs when she change this over she arrives with widespread left greater than right almost folliculitis-looking rash which is extremely pruritic. I don't see anything to culture here. There is no rash on the rest of her body. She feels well systemically. 03/08/15; selective debridement of both wounds using a curette. Base of this does not look unhealthy. She had limegreen drainage coming out of the left leg wound and describes a lot of drainage. The rash on her left leg looks improved to. No cultures were done. 03/22/15; patient was not here last week. Basal wounds does not look healthy and there is no surrounding erythema. No drainage. There is still a rash on the left leg that almost looks vasculitic however it is clearly limited to the top of where the wrap would be. 04/05/15; on the right required a surgical debridement of surface eschar and necrotic subcutaneous tissue. I did not debridement the area on the left. These continue to be large open wounds that are not changing that much. We were  successful at one point in healing the area on the right, and at the same time the area on the left was roughly half the size of current measurements. I think a lot of the deterioration has to do with the prolonged time the patient is on her feet at work 04/19/15 I attempted-like surface debridement bilaterally she does not tolerate this. She tells me that she was in allergic care yesterday with extreme pain over her left lateral malleolus/ankle and was told that she has an "sprain" 05/03/15; large bilateral venous insufficiency wounds over the medial malleolus/medial aspect of her ankles. She complains of copious amounts of drainage and his usual large amounts of pain. There is some increasing erythema around the wound on the right extending into the medial aspect of her foot to. historically she came in with these wounds the right one healed and the left one came down to roughly half its current size however the right one is reopened and the left is expanded. 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 05/10/15 large bilateral venous insufficiency wounds. There is less adherence surface left however the surface culture that I did last week grew pseudomonas therefore bilateral selective debridement score necessary. There is surrounding erythema. The patient describes severe bilateral drainage and a lot of pain in the left ankle. Apparently her podiatrist was were ready to do a cortisone shot 05/17/15; the patient complains of pain and again copious amounts of drainage. 05/24/15; we used Iodo flex last week. Patient notes considerable improvement in wound drainage. Only needed to change this once. 05/31/15; we continued Iodoflex; the base of these large wounds bilaterally is not too bad but there is probably likely a significant bioburden  here. I would like to debridement just doesn't tolerate it. 06/06/14 I would like to continue the Iodoflex although  she still hasn't managed to obtain supplies. She has bilateral medial malleoli or large wounds which are mostly superficial. Both of them are covered circumferentially with some nonviable fibrinous slough although she tolerates debridement very poorly. She apparently has an appointment for an ablation on the right leg by interventional radiology. 06/14/15; the patient arrives with the wounds and static condition. We attempted a debridement although she does not do well with this secondary to pain. I 07/05/15; wounds are not much smaller however there appears to be a cleaner granulating base. The left has tight fibrinous slough greater than the right. Debridement is tolerated poorly due to pain. Iodoflex is done more for these wounds in any of the multitude of different dressings I have tried on the left 1 and then subsequently the right. 07/12/15; no change in the condition of this wound. I am able to do an aggressive debridement on the right but not the left. She simply cannot tolerate it. We have been using Iodoflex which helps somewhat. It is worthwhile remembering that at one point we healed the right medial ankle wound and the left was about 25% of the current circumference. We have suggested returning to vascular surgery for review of possible further ablations for one reason or another she has not been able to do this. 07/26/15 no major change in the condition of either wound on her medial ankle. I did not attempt to debridement of these. She has been aggressively scrubbing these while she is in the shower at home. She has her supply of Iodoflex which seems to have done more for these wounds then anything I have put on recently. 08/09/15 wound area appears larger although not verified by measurements. Using Iodoflex 09/05/2015 -- she was here for avisit today but had significant problems with the wound and I was asked to see her for a physician opinion. I have summarize that this lady has had surgery  on her left lower extremity about 10 years ago where the possible veins stripping was done. She has had an opinion from interventional radiology around November 2016 where no further sclerotherapy was ordered. The patient works 12 hours a day and stands on a concrete floor with work boots and is unable to get the proper compression she requires and cannot elevate her limbs appropriately at any given time. She has recently grown Pseudomonas from her wound culture but has not started her ciprofloxacin which was called in for her. 09/13/15 this continues to be a difficult situation for this patient. At one point I had this wound down to a 1.5 x 1.5" wound on her left leg. This is deteriorated and the right leg has reopened. She now has substantial wounds on her medial calcaneus, malleoli and into her lower leg. One on the left has surface eschar but these are far too painful for me to debridement here. She has a vascular surgery appointment next week to see if anything can be done to help here. I think she has had previous ablations several years ago at [facility]. She has no major edema. She tells me that she did not get product last time [facility]] and went for several days without it. She continues to work in work boots 12 hours a day. She cannot get compression/4-layer under her work boots. 09/20/15 no major change. Periwound edema control was not very good. Her point with  pain and vascular is next Wednesday the 25th 09/28/15; the patient is seen vascular surgery and is apparently scheduled for repeat duplex ultrasounds of her bilateral lower legs next week. 10/05/15; the patient was seen by Dr. Doren Custard of vascular surgery. He feels that she should have arterial insufficiency excluded as cause/contributed to her nonhealing stage she is therefore booked for an arteriogram. She has apparently monophasic signals in the dorsalis pedis pulses. She also of course has known severe chronic venous insufficiency  with previous procedures as noted previously. I had another long discussion with the patient today about her continuing to work 12 hour shifts. I've written her out for 2 months area had concerns about this as her work location is currently undergoing significant turmoil and this may lead to her termination. She is aware of this however I agree with her that she simply cannot continue to stand for 12 hours multiple days a week with the substantial wound areas she has. 10/19/15; the Dr. Doren Custard appointment was largely for an arteriogram which was normal. She does not have an arterial issue. He didn't make a comment about her chronic venous insufficiency for which she has had previous ablations. Presumably it was not felt that anything additional could be done. The patient is now out of work as I prescribed 2 weeks ago. Her wounds look somewhat less aggravated presumably because of this. I felt I would give debridement another try today 10/25/15; no major change in this patient's wounds. We are struggling to get her product that she can afford into her own home through her insurance. 11/01/15; no major change in the patient's wounds. I have been using silver alginate as the most affordable product. I spoke to Dr. Marla Roe last week with her requested take her to the OR for surgical debridement and placement of ACEL. Dr. Marla Roe told me that she would be willing to do this however [facility] will not cover this, fortunately the patient has [place] healthcare of some variant 11/08/15; no major change in the patient's wounds. She has been completely nonviable surface that this but is in too much pain with any attempted debridement are clinic. I have arranged for her to see Dr. Marla Roe ham of plastic surgery and this appointment is on Monday. I am hopeful that they will take her to the OR for debridement, possible ACEL ultimately possible skin graft 11/22/15 no major change in the patient's wounds  over her bilateral medial calcaneus medial malleolus into the lower legs. Surface on these does not look too bad however on the left there is surrounding erythema and tenderness. This may be cellulitis or could him sleepy tinea. 11/29/15; no major changes in the patient's wounds over her bilateral medial malleolus. There is no infection here and I don't think any additional antibiotics are necessary. There is now plan to move forward. She sees Dr. Marla Roe in a week's time for preparation for operative debridement and ACEL placement I believe on 7/12. She then has a follow-up appointment with Dr. Marla Roe on 7/21 12/28/15; the patient returns today having been taken to the [place] by Dr. Marla Roe 12/12/15 she underwent debridement, intraoperative cultures [which were negative]. She had placement of a wound VAC. Parent really ACEL was not available to be placed. The wound VAC foam apparently adhered to the wound since then she's been using silver alginate, Xeroform under Ace wraps. She still says there is a lot of drainage and a lot of pain 01/31/16; this is a patient I  see monthly. I had referred her to Dr. Marla Roe him of plastic surgery for large wounds on her bilateral medial ankles. She has been to the OR twice once in early July and once in early August. She tells me over the last 3 weeks she has been using the wound VAC with ACEL underneath it. On the right we've simply been using silver alginate. Under Kerlix Coban wraps. 02/28/16; this is a patient I'm currently seeing monthly. She is gone on to have a skin graft over her large venous insufficiency ulcer on the left medial ankle. This was done by Dr. Marla Roe him. The patient is a bit perturbed about why she didn't have one on her right medial ankle wound. She has been using silver alginate to this. 03/06/16; I received a phone call from her plastic surgery Dr. Marla Roe. She expressed some concern about the viability of the skin graft she did  on the left medial ankle wound. Asked me to place Endoform on this. She told me she is not planning to do a subsequent skin graft on the right as the left one did not take very well. I had placed Hydrofera Blue on the right 03/13/16; continue to have a reasonably healthy wound on the right medial ankle. Down to 3 mm in terms of size. There is epithelialization here. The area on the left medial ankle is her skin graft site. I suppose the last week this looks somewhat better. She has an open area inferiorly however in the center there appears to be some viable tissue. There is a lot of surface callus and eschar that will eventually need to come off however none of this looked to be infected. Patient states that the is able to keep the dressing on for several days which is an improvement. 03/20/16 no major change in the circumference of either wound however on the left side the patient was at Dr. Eusebio Friendly office and they did a debridement of left wound. 50% of the wound seems to be epithelialized. I been using Endoform on the left Hydrofera Blue in the right 03/27/16; she arrives today with her wound is not looking as healthy as they did last week. The area on the right clearly has an adherent surface to this a very similar surface on the left. Unfortunately for this patient this is all too familiar problem. Clearly the Endoform is not working and will need to change that today that has some potential to help this surface. She does not tolerate debridement in this clinic very well. She is changing the dressing wants 04/03/16; patient arrives with the wounds looking somewhat better especially on the right. Dr. Migdalia Dk change the dressing to silver alginate when she saw her on Monday and also sold her some compression socks. The usefulness of the latter is really not clear and woman with severely draining wounds. 04/10/16; the patient is doing a bit of an experiment wearing the compression stockings that  Dr. Migdalia Dk provided her to her left leg and the out of legs based dressings that we provided to the right. 05/01/16; the patient is continuing to wear compression stockings Dr. Migdalia Dk provided her on the left that are apparently silver impregnated. She has been using Iodoflex to the right leg wound. Still a moderate amount of drainage, when she leaves here the wraps only last for 4 days. She has to change the stocking on the left leg every night 05/15/16; she is now using compression stockings bilaterally provided by Dr. Marla Roe. She  is wearing a nonadherent layer over the wounds so really I don't think there is anything specific being done to this now. She has some reduction on the left wound. The right is stable. I think all healing here is being done without a specific dressing 06/09/16; patient arrives here today with not much change in the wound certainly in diameter to large circular wounds over the medial aspect of her ankle bilaterally. Under the light of these services are certainly not viable for healing. There is no evidence of surrounding infection. She is wearing compression stockings with some sort of silver impregnation as prescribed by Dr. Marla Roe. She has a follow-up with her tomorrow. 06/30/16; no major change in the size or condition of her wounds. These are still probably covered with a nonviable surface. She is using only her purchase stockings. She did see Dr. Marla Roe who seemed to want to apply Dakin's solution to this I'm not extreme short what value this would be. I would suggest Iodoflex which she still has at home. 07/28/16; I follow Mrs. Wahba episodically along with Dr. Marla Roe. 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 She has been applying a topical collagen ointment to both wounds with Adaptic. I don't think Dr. Marla Roe is planning to take her back to the OR. 08/19/16; I follow Mrs. Jeneen Rinks on a  monthly basis along with Dr. Marla Roe of plastic surgery. 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 I been following her for a number of years. At one point I was able to get the right medial malleolus wound to heal and had the left medial malleolus down to about half its current size however and I had to send her to plastic surgery for an operative debridement. Since then things have been stable to slightly improve the area on the right is slightly better one in the left about the same although there is much less adherent surface than I'm used to with this patient. She is using some form of liquid collagen gel that Dr. Marla Roe provided a Kerlix cover with the patient's own pressure stockings. She tells me that she has extreme pain in both ankles and along the lateral aspect of both feet. She has been unable to work for some period of time. She is telling me she is retiring at the beginning of April. She sees Dr. Doran Durand of orthopedics next week 09/22/16; patient has not seen Dr. Marla Roe since the last time she is here. I'm not really sure what she is using to the wounds other than bits and pieces of think she had left over including most recently Hydrofera Blue. She is using juxtalite stockings. She is having difficulty with her husband's recent illness "stroke". She is having to transport him to various doctors appointments. Dr. Marla Roe left her the option of a repeat debridement with ACEL however she has not been able to get the time to follow-up on this. She continues to have a fair amount of drainage out of these wounds with certainly precludes leaving dressings on all week 10/13/16; patient has not seen Dr. Marla Roe since she was last in our clinic. I'm not really sure what she is doing with the wounds, we did try to get her [facility] and I think she is actually using this most of the time. Because of drainage she  states she has to change this every second day although this is  an improvement from what she used to do. She went to see Dr. Doran Durand who did not think she had a muscular issue with regards to her feet, he referred her to a neurologist and I think the appointment is sometime in June. I changed her back to Iodoflex which she has used in the past but not recently. 11/03/16; the patient has been using Iodoflex although she ran out of this. Still claims that there is a lot of drainage although the wound does not look like this. No surrounding erythema. She has not been back to see Dr. Marla Roe 11/24/16; the patient has been using Iodoflex again but she ran out of it 2 or 3 days ago. There is no major change in the condition of either one of these wounds in fact they are larger and covered in a thick adherent surface slough/nonviable tissue especially on the left. She does not tolerate mechanical debridement in our clinic. Going back to see Dr. Marla Roe of plastic surgery for an operative debridement would seem reasonable. 12/15/16; the patient has not been back to see Dr. Marla Roe. She is been dealing with a series of illnesses and her husband which of monopolized her time. She is been using Sorbact which we largely supplied. She states the drainage is bad enough that it maximum she can go 2-3 days without changing the dressing 01/12/2017 -- the patient has not been back for about 4 weeks and has not seen Dr. Marla Roe not does she have any appointment pending. 01/23/17; patient has not seen Dr. Marla Roe even though I suggested this previously. She is using Santyl that was suggested last week by Dr. Con Memos this Cost her $16 through her insurance which is indeed surprising 02/12/17; continuing Santyl and the patient is changing this daily. A lot of drainage. She has not been back to see plastic surgery she is using an Ace wrap. Our intake nurse suggested wrap around stockings which would make a good  reasonable alternative 02/26/17; patient is been using Santyl and changing this daily due to drainage. She has not been to see plastic surgery she uses in April Ace wrap to control the edema. She did obtain extremitease stockings but stated that the edema in her leg was to big for these 03/20/17; patient is using Santyl and Anasept. Surfaces looked better today the area on the right is actually measuring a little smaller. She has states she has a lot of pain in her feet and ankles and is asking for a consult to pain control which I'll try to help her with through our case manager. 04/10/17; the patient arrives with better-looking wound surfaces and is slightly smaller wound on the left she is using a combination of Santyl and Anasept. She has an appointment or at least as started in the pain control center associated with [facility] 05/14/17; this is a patient who I followed for a prolonged period of time. She has venous insufficiency ulcers on her bilateral medial ankles. At one point I had this down to a much smaller wound on the left however these reopened and we've never been able to get these to heal. She has been using Santyl and Anasept gel although 2 weeks ago she ran out of the Anasept gel. She has a stable appearance of the wound. She is going to the wound care clinic at [facility]. They wanted do a nerve block/spinal block although she tells me she is reluctant to go forward with that. 05/21/17; this is a patient  I have followed for many years. She has venous insufficiency ulcers on her bilateral medial ankles. Chronic pain and deformity in her ankles as well. She is been to see plastic surgery as well as orthopedics. Using PolyMem AG most recently/Kerramax/ABDs and 2 layer compression. She has managed to keep this on and she is coming in for a nurse check to change the dressing on Tuesdays, we see her on Fridays 06/05/17; really quite a good looking surface and the area especially  on the right medial has contracted in terms of dimensions. Well granulated healthy-looking tissue on both sides. Even with an open curet there is nothing that even feels abnormal here. This is as good as I've seen this in quite some time. We have been using PolyMem AG and bringing her in for a nurse check 06/12/17; really quite good surface on both of these wounds. The right medial has contracted a bit left is not. We've been using PolyMem and AG and she is coming in for a nurse visit 06/19/17; we have been using PolyMem AG and bringing her in for a nurse check. Dimensions of her wounds are not better but the surfaces looked better bilaterally. She complained of bleeding last night and the left wound and increasing pain bilaterally. She states her wound pain is more neuropathic than just the wounds. There was some suggestion that this was radicular from her pain management doctor in talking to her it is really difficult to sort this out. 06/26/17; using PolyMem and AG and bringing her in for a nurse check as All of this and reasonably stable condition. Certainly not improved. The dimensions on the lateral part of the right leg look better but not really measuring better. The medial aspect on the left is about the same. 07/03/16; we have been using PolyMen AG and bringing her in for a nurse check to change the dressings as the wounds have drainage which precludes once weekly changing. We are using all secondary absorptive dressings.our intake nurse is brought up the idea of using a wound VAC/snap VAC on the wound to help with the drainage to see if this would result in some contraction. This is not a bad idea. The area on the right medial is actually looking smaller. Both wounds have a reasonable-looking surface. There is no evidence of cellulitis. The edema is well controlled 07/10/17; the patient was denied for a snap VAC by her insurance. The major issue with these wounds continues to be drainage. We are  using wicked PolyMem AG and she is coming in for a nurse visit to change this. The wounds are stable to slightly improved. The surface looks vibrant and the area on the right certainly has shrunk in size but very slowly 07/17/17; the patient still has large wounds on her bilateral medial malleoli. Surface of both of these wounds looks better. The dimensions seem to come and go but no consistent improvement. There is no epithelialization. We do not have options for advanced treatment products due to insurance issues. They did not approve of the wound VAC to help control the drainage. More recently we've been using PolyMem and AG wicked to allow drainage through. We have been bringing her in for a nurse visit to change this. We do not have a lot of options for wound care products and the home again due to insurance issues 07/24/17; the patient's wound actually looks somewhat better today. No drainage measurements are smaller still healthy-looking surface. We used silver collagen under PolyMen  started last week. We have been bringing her in for a dressing change 07/31/17; patient's wound surface continued to look better and I think there is visible change in the dimensions of the wound on the right. Rims of epithelialization. We have been using silver collagen under PolyMen and bringing her in for a dressing change. There appears to be less drainage although she is still in need of the dressing change 08/07/17. Patient's wound surface continues to look better on both sides and the area on the right is definitely smaller. We have been using silver collagen and PolyMen. She feels that the drainage has been it has been better. I asked her about her vascular status. She went to see Dr. Aleda Grana at [facility] and had some form of ablation. I don't have much detail on this. I haven't my notes from 2016 that she was not a candidate for any further ablation but I don't have any more information on this. We had  referred her to vein and vascular I don't think she ever went. He does not have a history of PAD although I don't have any information on this either. We don't even have ABIs in our record 08/14/17; we've been using silver collagen and PolyMen cover. And putting the patient and compression. She we are bringing her in as a nurse visit to change this because ofarge amount of drainage. We didn't the ABIs in clinic today since they had been done in many moons 1.2 bilaterally. She has been to see vein and vascular however this was at [facility] and she had ablation although I really don't have any information on this all seemed biking get a report. She is also been operatively debrided by plastic surgery and had a cell placed probably 8-12 months ago. This didn't have a major effect. We've been making some gains with current dressings 08/19/17-She is here in follow-up evaluation for bilateral medial malleoli ulcers. She continues to tolerate debridement very poorly. We will continue with recently changed topical treatment; if no significant improvement may consider switching to Iodosorb/Iodoflex. 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 She has been using silver collagen and PolyMem. I believe she has been used and tried on Iodoflex before. 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 These have been very refractory over time. We have been using silver collagen and PolyMen. She is coming in once a week for a doctor's and once a week for nurse visits. We are actually making some progress 09/18/17; the patient's wounds are smaller especially on the right medial. She arrives today to upset to consider even washing these off with Anasept which I think is been part of the  reason this is been closing. We've been using collagen covered in PolyMen otherwise. It is noted that she has a small area of folliculitis on the right medial calf that. As we are wrapping her legs I'll give her a short course of doxycycline to make sure this doesn't amount to anything. She is a long list of complaints today including imbalance, shortness of breath on exertion, inversion of her left ankle. With regards to the latter complaints she is been to see orthopedics and they offered her a tendon release surgery I believe but wanted her wounds to be closed first. I  have recommended she go see her primary physician with regards to everything else. 09/25/17; patient's wounds are about the same size. We have made some progress bilaterally although not in recent weeks. She will not allow me T wash these wounds with Anasept even if she is doing her cell. Wheeze we've been using collagen covered in PolyMen. Last week she had a small area of folliculitis this is now opened into a small wound. She completed 5 days of trimethoprim sulfamethoxazole 10/02/17; unfortunately the area on her left medial ankle is worse with a larger wound area towards the Achilles. The patient complains of a lot of pain. She will not allow debridement although visually I don't think there is anything to debridement in any case. We have been using silver collagen and PolyMen for several months now. Initially we are making some progress although I'm not really seeing that today. We will move back to [facility]. His admittedly this is a bit of a repeat however I'm hoping that his situation is different now. The patient tells me she had her leg on the left give out on her yesterday this is process some pain. 10/09/17; the patient is seen twice a week largely because of drainage issues coming out of the chronic medial bimalleolar wounds that are chronic. Last week the dimensions of the one on the left looks a little larger I  changed her to [facility]. She comes in today with a history of terrible pain in the bilateral wound areas. She will not allow debridement. She will not even allow a tissue culture. There is no surrounding erythema no no evidence of cellulitis. We have been putting her Kerlix Coban man. She will not allow more aggressive compression as there was a suggestion to put her in 3 layer wraps. 10/16/17; large wounds on her bilateral medial malleoli. These are chronic. Not much change from last week. The surface looks have healthy but absolutely no epithelialization. A lot of pain little less so of drainage. She will not allow debridement or even washing these off in the vigorous fashion with Anasept. 10/23/17; large wounds on her bilateral malleoli which are chronic. Some improvement in terms of size perhaps on the right since last time I saw these. She states that after we increased the 3 layer compression there was some bleeding, when she came in for a nurse visit she did not want 3 layer compression put back on about our nurse managed to convince her. She has known chronic venous visit issues and I'm hoping to get her to tolerate the 3 layer compression. using Hydrofera Blue 10/30/17; absolutely no change in the condition of either wound although we've had some improvement in dimensions on the right.. Attempted to put her in 3 layer compression she didn't tolerated she is back in 2 layer compression. We've been using Hydrofera Blue We looked over her past records. She had venous reflux studies in November 2016. There was no evidence of deep venous reflux on the right. Superficial vein did not show the greater saphenous vein at think this is been previously ablated the small saphenous vein was within normal limits. The left deep venous system showed no DVT the vessels were positive for deep venous reflux in the posterior tibial veins at the ankle. The greater saphenous vein was surgically absent small  saphenous vein was within normal limits. She went to vein and vascular at [facility]. I believe she had an ablation on the left greater saphenous vein. I'll update  her reflux studies perhaps ever reviewed by vein and vascular. We've made absolutely no progress in these wounds. Will also try to read and TheraSkins through her insurance 11/06/17; W the patient apparently has a 2 week follow-up with vein and vascular I like him to review the whole issue with regards to her previous vascular workup by Dr. Aleda Grana. We've really made no progress on these wounds in many months. She arrives today with less viable looking surface on the left medial ankle wound. This was apparently looking about the same on Tuesday when she was here for nurse visit. 11/13/17; deep tissue culture I did last time of the left lower leg showed multiple organisms without any predominating. In particular no Staphylococcus or group A strep were isolated. We sent her for venous reflux studies. She's had a previous left greater saphenous vein stripping and I think sclerotherapy of the right greater saphenous vein. She didn't really look at the lesser saphenous vein this both wounds are on the medial aspect. She has reflux in the common femoral vein and popliteal vein and an accessory vein on the right and the common femoral vein and popliteal vein on the left. I'm going to have her go to see vein and vascular just the look over things and see if anything else beside aggressive compression is indicated here. We have not been able to make any progress on these wounds in spite of the fact that the surface of the wounds is never look too bad. 11/20/17; no major change in the condition of the wounds. Patient reports a large amount of drainage. She has a lot of complaints of pain although enlisting her today I wonder if some of this at least his neuropathic rather than secondary to her wounds. She has an appointment with vein and vascular  on 12/30/17. The refractory nature of these wounds in my mind at least need vein and vascular to look over the wounds the recent reflux studies we did and her history to see if anything further can be done here. I also note her gait is deteriorated quite a bit. 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 She has a bilateral Trendelenburg gait. I wonder if this is neuropathic or perhaps multilevel radicular. 11/27/17; her wounds actually looks slightly better. Healthy-looking granulation tissue a scant amount of epithelialization. [place] healthcare will not pay for [employer]. They will play for tri layer Oasis and Dermagraft. This is not a diabetic ulcer. We'll try for the tri layer Oasis. She still complains of some drainage. She has a vein and vascular appointment on 12/30/17 12/04/17; the wounds visually look quite good. Healthy-looking granulation with some degree of epithelialization. We are still waiting for response to our request for trial to try layer Oasis. Her appointment with vascular to review venous and arterial issues isn't sold the end of July 7/31. Not allow debridement or even vigorous cleansing of the wound surface. 12/18/17; slightly smaller especially on the right. 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 We've been using Hydrofera Blue. We still are looking into triple layer Oasis through her insurance 01/08/18 on evaluation today patient's wound actually appears to be showing signs of good improvement at this point in time. She has been tolerating the dressing changes without complication. Fortunately there does not appear to be any evidence of infection at this point in time. We have been utilizing silver nitrate which does seem to be of benefit for her which is also good news.  Overall I'm very happy with how things seem to be both regards appearance as well as measurement. Patient did see Dr. Bridgett Larsson for evaluation on 12/30/17. In his assessment he  felt that stripping would not likely add much more than chronic compression to the patient's healing process. His recommendation was to follow-up in three months with Dr. Doren Custard if she hasn't healed in order to consider referral back to you and see vascular where she previously was in a trial and was able to get her wound to heal. I'll be see what she feels she when you staying compression and he reiterated this as well. 01/13/18 on evaluation today patient appears to actually be doing very well in regard to her bilateral medial malleolus ulcers. She seems to have tolerated the chemical cauterization with silver nitrate last week she did have some pain through that evening but fortunately states that I'll be see since it seems to be doing better she is overall pleased with the progress. 01/21/18; really quite a remarkable improvement since I've last seen these wounds. We started using silver nitrate specially on the islands of hyper granulation which for some reason her around the wound circumference. This is really done quite nicely. Primary dressing Hydrofera Blue under 4 layer compression. She seems to be able to hold out without a nurse rewrap. Follow-up in 1 week 01/28/18; we've continued the hydrofera blue but continued with chemical cauterization to the wound area that we started about a month ago for irregular hyper granulation. She is made almost stunning improvement in the overall wound dimensions. I was not really expecting this degree of improvement in these chronic wounds 02/05/18; we continue with Hydrofera Bluebut of also continued the aggressive chemical cauterization with silver nitrate. We made nice progress with the right greater than left wound. 02/12/18. We continued with Hydrofera Blue after aggressive chemical cauterization with silver nitrate. We appear to be making nice progress with both wound areas 02/19/2018; we continue with [facility] after washing the wounds vigorously  with Anasept spray and chemical cauterization with silver nitrate. We are making excellent progress. The area on the right's just about closed 02/26/2018. The area on the left medial ankle had too much necrotic debris today. I used a #5 curette we are able to get most of the soft. I continued with the silver nitrate to the much smaller wound on the right medial ankle she had a new area on her right lower pretibial area which she says was due to a role in her compression 03/05/2018; both wound areas look healthy. Not much change in dimensions from last week. I continue to use silver nitrate and Hydrofera Blue. The patient saw Dr. Doren Custard of vein and vascular. He felt she had venous stasis ulcers. He felt based on her previous arteriogram she should have adequate circulation for healing. Also she has deep venous reflux but really no significant correctable superficial venous reflux at this time. He felt we should continue with conservative management including leg elevation and compression 04/02/2018; since we last saw this woman about a month ago she had a fall apparently suffered a pelvic fracture. I did not look up the x-ray. Nevertheless because of pain she literally was bedbound for 2 weeks and had home health coming out to change the dressing. Somewhat predictably this is resulted in considerable improvement in both wound areas. The right is just about closed on the medial malleolus and the left is about half the size. 04/16/2018; both her wounds continue to  go down in size. Using Hydrofera Blue. 05/07/18; both her wounds appeared to be improving especially on the right where it is almost closed. We are using Hydrofera Blue 05/14/2018; slightly worse this week with larger wounds. Surface on the left medial not quite as good. We have been using Hydrofera Blue 05/21/18; again the wounds are slightly larger. Left medial malleolus slightly larger with eschar around the circumference. We have been using  Hydrofera Blue undergoing a wraps for a prolonged period of time. This got a lot better when she was more recumbent due to a fall and a back injury. I change the primary dressing the silver alginate today. She did not tolerate a 4 layer compression previously although I may need to bring this up with her next time 05/28/2018; area on the left medial malleolus again is slightly larger with more drainage. Area on the right is roughly unchanged. She has a small area of folliculitis on the right medial just on the lower calf. This does not look ominous. 06/03/2018 left medial malleolus slightly smaller in a better looking surface. We used silver nitrate on this last time with silver alginate. The area on the right appears slightly smaller 1/10; left medial malleolus slightly smaller. Small open area on the right. We used silver nitrate and silver alginate as of 2 weeks ago. We continue with the wound and compression. These got a lot better when she was off her feet 1/17; right medial malleolus wound is smaller. The left may be slightly smaller. Both surfaces look somewhat better. 1/24; both wounds are slightly smaller. Using silver alginate under Unna boots 1/31; both wounds appear smaller in fact the area on the right medial is just about closed. Surface eschar. We have been using silver alginate under Unna boots. The patient is less active now spends let much less time on her feet and I think this is contributed to the general improvement in the wound condition 2/7; both wounds appear smaller. I was hopeful the right medial would be closed however there there is still the same small open area. Slight amount of surface eschar on the left the dimensions are smaller there is eschar but the wound edges appear to be free. We have been using silver alginate under Unna boot's 2/14; both wounds once again measure smaller. Circumferential eschar on the left medial. We have been using silver alginate under Unna  boots with gradual improvement 2/21; the area on the right medial malleolus has healed. The area on the left is smaller. We have been using silver alginate and Unna boots. We can discharge wrapping the right leg she has 20/30 stockings at home she will need to protect the scar tissue in this area 2/28; the area on the right medial malleolus remains closed the patient has a compression stocking. The area on the left is smaller. We have been using silver alginate and Unna boots. 3/6 the area on the right medial ankle remains closed. Good edema control noted she is using her own compression stocking. The area on the left medial ankle is smaller. We have been managing this with silver alginate and Unna boots which we will continue today. 3/13; the area on the right medial ankle remains closed and I'm declaring it healed today. When necessary the left is about the same still a healthy-looking surface but no major change and wound area. No evidence of infection and using silver alginate under unna and generally making considerable improvement 3/27 the area on the right medial  ankle remains closed the area on the left is about the same as last week. Certainly not any worse we have been using silver alginate under an Unna boot 4/3; the area on the right medial ankle remains closed per the patient. We did not look at this wound. The wound on the left medial ankle is about the same surface looks healthy we have been using silver alginate under an Unna boot 4/10; area on the right medial ankle remains closed per the patient. We did not look at this wound. The wound on the left medial ankle is slightly larger. The patient complains that the [facility] caused burning pain all week. She also told us that she was a lot more active this week. Changed her back to silver alginate 4/17; right medial ankle still closed per the patient. Left medial ankle is slightly larger. Using silver alginate. She did not  tolerate Hydrofera Blue on this area 4/24; right medial ankle remains closed we have not look at this. The left medial ankle continues to get larger today by about a centimeter. We have been using silver alginate under Unna boots. She complains about 4 layer compression as an alternative. She has been up on her feet working on her garden 5/8; right medial ankle remains closed we did not look at this. The left medial ankle has increased in size about 100%. We have been using silver alginate under Unna boots. She noted increased pain this week and was not surprised that the wound is deteriorated 5/15; no major change in SA however much less erythema ( one week of doxy ocellulitis). 5/22-65 year old female returns at 1 week to the clinic for left medial ankle wound for which we have been using silver alginate under 3 layer compression She was placed on DOXY at last visit - the wound is wider at this visit. She is in 3 layer compression 5/29; change to [facility] last week. I had given her empiric doxycycline 2 weeks ago for a week. She is in 3 layer compression. She complains of a lot of pain and drainage on presentation today. 6/5; using Hydrofera Blue. I gave her doxycycline recently empirically for erythema and pain around the wound. Believe her cultures showed enterococcus which not would not have been well covered by doxycycline nevertheless the wound looks better and I don't feel specifically that the enterococcus needs to be covered. She has a new what looks like a wrap injury on her lateral left ankle. 6/12; she is using Hydrofera Blue. She has a new area on the left anterior lower tibial area. This was a wrap injury last week. 6/19; the patient is using Hydrofera Blue. She arrived with marked inflammation and erythema around the wound and tenderness. 12/01/18 on evaluation today patient appears to be doing a little bit better based on what I'm hearing from the standpoint of lassos evaluation  to this as far as the overall appearance of the wound is concerned. Then sometime substandard she typically sees Dr. Dellia Nims. Nonetheless overall very pleased with the progress that she's made up to this point. No fevers, chills, nausea, or vomiting noted at this time. 7/10; some improvement in the surface area. Aggressively debrided last week apparently. I went ahead with the debridement today although the patient does not tolerate this very well. We have been using Iodoflex. Still a fair amount of drainage 7/17; slightly smaller. Using Iodoflex. 7/24; no change from last week in terms of surface area. We have been using Iodoflex.  Surface looks and continues to look somewhat better 7/31; surface area slightly smaller better looking surface. We have been using Iodoflex. This is under Unna boot compression 8/7-Patient presents at 1 week with Unna boot and Iodoflex, wound appears better 8/14-Patient presents at 1 week with Iodoflex, we use the Unna boot, wound appears to be stable better.Patient is getting Botox treatment for the inversion of the foot for tendon release, Next week 8/21; we are using Iodoflex. Unna boot. The wound is stable in terms of surface area. Under illumination there is some areas of the wound that appear to be either epithelialized or perhaps this is adherent slough at this point I was not really clear. It did not wipe off and I was reluctant to debride this today. 8/28; we are using Iodoflex in an Unna boot. Seems to be making good improvement. 9/4; using Iodoflex and wound is slightly smaller. 9/18; we are using Iodoflex with topical silver nitrate when she is here. The wound continues to be smaller 10/2; patient missed her appointment last week due to GI issues. She left and Iodoflex based dressing on for 2 weeks. Wound is about the same size about the size of a dime on the left medial lower 10/9 we have been using Iodoflex on the medial left ankle wound. She has a new  superficial probable wrap injury on the dorsal left ankle 10/16; we have been using Hydrofera Blue since last week. This is on the left medial ankle 10/23; we have been using Hydrofera Blue since 2 weeks ago. This is on the left medial ankle. Dimensions are better 11/6; using Hydrofera Blue. I think the wound is smaller but still not closed. Left medial ankle 11/13; we have been using Hydrofera Blue. Wound is certainly no smaller this week. Also the surface not as good. This is the remanent of a very large area on her left medial ankle. 11/20; using Sorbact since last week. Wound was about the same in terms of size although I was disappointed about the surface debris 12/11; 3-week follow-up. Patient was on vacation. Wound is measuring slightly larger we have been using Sorbact. 12/18; wound is about the same size however surface looks better last week after debridement. We have been using Sorbact under compression 1/15 wound is probably twice the size of last time increased in length nonviable surface. We have been using Sorbact. She was running a mild fever and missed her appointment last week 1/22; the wound is come down in size but under illumination still a very adherent debris we have been Hydrofera Blue that I changed her to last week 1/29; dimensions down slightly. We have been using Hydrofera Blue 2/19 dimensions are the same however there is rims of epithelialization under illumination. Therefore more the surface area may be epithelialized 2/26; the patient's wound actually measures smaller. The wound looks healthy. We have been using Hydrofera Blue. I had some thoughts about running Apligraf then I still may do that however this looks so much better this week we will delay that for now 3/5; the wound is small but about the same as last week. We have been using Hydrofera Blue. No debridement is required today. 3/19; the wound is about the size of a dime. Healthy looking wound even under  illumination. We have been using Hydrofera Blue. No mechanical debridement is necessary 3/26; not much change from last week although still looks very healthy. We have been using Hydrofera Blue under Unna boots Patient was offered an ankle  fusion by podiatry but not until the wound heals with a proceed with this. 4/9; the patient comes in today with her original wound on the medial ankle looking satisfactory however she has some uncontrolled swelling in the middle part of her leg with 2 new open areas superiorly just lateral to the tibia. I think this was probably a wrap issue. She said she felt uncomfortable during the week but did not call in. We have been using Hydrofera Blue 4/16; the wound on the medial ankle is about the same. She has innumerable small areas superior to this across her mid tibia. I think this is probably folliculitis. She is also been working in the yard doing a lot of sweating 4/30; the patient issue on the upper areas across her mid tibia of all healed. I think this was excessive yard work if I remember. Her wound on the medial ankle is smaller. Some debris on this we have been using Hydrofera Blue under Unna boots 5/7; mid tibia. She has been using Hydrofera Blue under an Unna wrap. She is apparently going for her ankle surgery on June 3 10/28/19-Patient returns to clinic with the ankle wound, we are using Hydrofera Blue under Unna wrap, surgery is scheduled for her left foot for June 3 so she will be back for nurse visit next week READMISSION 01/17/2020 Mrs. Banfield is a 65 year old woman we have had in this clinic for a long period of time with severe venous hypertension and refractory wounds on her medial lower legs and ankles bilaterally. 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 When she was off her legs for a prolonged period example when she fell  and suffered a compression fracture things would heal up quite nicely. She is now retired and we managed to heal up the right medial leg wound. The left one was very tiny last time I saw this although still refractory. 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 It got to the point where this was interfering with her walking and she elected to proceed with a ankle arthrodesis to straighten her her ankle and leave her with a functional outcome for mobilization. The patient was referred to Dr. Doren Custard and really this took some time to arrange. Dr. Doren Custard saw her on 12/07/2019. Once again he verified that she had no arterial issues. She had previously had an angiogram several years ago. Follow-up ABIs on the left showed an ABI of 1.12 with triphasic waveforms and a TBI of 0.92. She is felt to have chronic deep venous insufficiency but I do not think it was felt that anything could be done from about this from an ablation point of view. At the time Dr. Doren Custard saw this patient the wounds actually look closed via the pictures in his clinic. The patient finally underwent her surgery on 12/15/2019. This went reasonably well and there was a good anatomic outcome. She developed a small distal wound dehiscence on the lateral part of the surgical wound. However more problematically she is developed recurrence of the wound on the medial left ankle. There are actually 2 wounds here one in the distal lower leg and 1 pretty much at the level of the medial malleolus. It is a more distal area that is more problematic. She has been using  Hydrofera Blue which started on Friday before this she was simply Ace wrapping. There was a culture done that showed Pseudomonas and she is on ciprofloxacin. A recent CNS on 8/11 was negative. The patient reports some pain but I generally think this is improving. She is using a cam boot completely  nonweightbearing using a walker for pivot transfers and a wheelchair 8/24; not much improvement unfortunately she has a surgical wound on the lateral part in the venous insufficiency wound medially. The bottom part of the medial insufficiency wound is still necrotic there is exposed tendon here. We have been using Hydrofera Blue under compression. Her edema control is however better 8/31; patient in for follow-up of his surgical wound on the lateral part of her left leg and chronic venous insufficiency ulcers medially. We put her back in compression last week. She comes in today with a complaint of 3 or 4 days worth of increasing pain. She felt her cam walker was rubbing on the area on the back of her heel. However there is intense erythema seems more likely she has cellulitis. She had 2 cultures done when she was seeing podiatry in the postop. One of them in late July showed Pseudomonas and she received a course of ciprofloxacin the other was negative on 8/11 she is allergic to penicillin with anaphylactoid complaints of hives oral swelling via information in epic 9/9; when I saw this patient last week she had intense anterior erythema around her wound on the right lateral heel and ankle and also into the right medial heel. Some of this was no doubt drainage and her walker boot however I was convinced she had cellulitis. I gave her Levaquin and Bactrim she is finishing up on this now. She is following up with Dr. [name] he saw her yesterday. He is taken her out of the walking boot of course she is still nonweightbearing. Her x-ray was negative for any worrisome features such as soft tissue air etc. Things are a lot better this week. She has home health. We have been using Hydrofera Blue under an [employer] which she put back on yesterday. I did not wrap her last week 9/17; her surrounding skin looks a lot better. In fact the area on the left lateral ankle has just a scant amount of eschar. The only  remaining wound is the large area on the left medial ankle. Probably about 60% of this is healthy granulation at the surface however she has a significant divot distally. This has adherent debris in it. I been using debridement and silver collagen to try and get this area to fill-in although I do not think we have made much progress this week 9/24; the patient's wound on the left medial ankle looks a lot better. The deeper divot area distally still requires debridement but this is cleaning up quite nicely we have been using silver collagen. The patient is complaining of swelling in her foot and is worried that that is contributing to the nonhealing of the ankle wound. She is also complaining of numbness in her anterior toes 10/4; left medial ankle. The small area distally still has a divot with necrotic material that I have been debriding away. This has an undermining area. She is approved for Apligraf. She saw Dr. [name] her surgeon on 10/1. I think he declared himself is satisfied with the condition of things. Still nonweightbearing till the end of the month. 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 Her  surgical wound is well closed. There is no evidence of infection 10/11; the patient arrived in clinic today with the expectation that we be able to put an Apligraf on this area after debridement however she arrives with a relatively large amount of green drainage on the dressing. The patient states that this started on Friday. She has not been systemically unwell. 10/19; culture I did last week showed both Enterococcus and Pseudomonas. I think this came in separate parts because I stopped her ciprofloxacin I gave her and prescribed her linezolid however now looking at the final culture result this is Pseudomonas which is resistant to quinolones. She has not yet picked up the linezolid apparently phone issues. We are also trying to get a topical antibiotic out of  [place] in [place] they can be applied by home health. She is still having green drainage 10/16; the patient has her topical antibiotic from [facility] in [place]. This is a compounded gel with vancomycin and ciprofloxacin and gentamicin. We are applying this on the wound bed with silver alginate over the top with Unna boot wraps. She arrives in clinic today with a lot less ominous looking drainage although she is only use this topical preparation once the second time today. She sees Dr. [name] her surgeon on Friday she has home health changing the dressing 11/2; still using her compounded topical antibiotic under silver alginate. Surface is cleaning up there is less drainage. We had an Apligraf for her today and I elected to apply it. A light coating of her antibiotic 04/25/2020 upon evaluation today patient appears to be doing well with regard to her ankle ulcer. There is a little bit of slough noted on the surface of the wound I am can have to perform sharp debridement to clear this away today. With that being said other than that fact overall I feel like she is making progress and we do see some new epithelial growth. There is also some improvement in the depth of the wound and that distal portion. There is little bit of slough there as well. 12/7; 2-week follow-up. Apligraf #3. Dimensions are smaller. Closing in especially inferiorly. Still some surface debris. Still using the [facility] topical antibiotic but I told her that I don't think this needs to be renewed 12/21; 2-week follow-up. Apligraf #4 dimensions are smaller. Nice improvement 06/05/2020; 2-week follow-up. The patient's wound on the left medial ankle looks really excellent. Nice granulation. Advancing epithelialization no undermining no evidence of infection. We would have to reapply for another Apligraf but with the condition of this wound I did not feel strongly about it. We used Hydrofera Blue under the same degree  of compression. She follows up with Dr. [name] her surgeon a week Friday 06/13/2020 upon evaluation today patient appears to be doing excellent in regard to her wound. She has been tolerating the dressing changes without complication. Fortunately there is no signs of active infection at this time. No fevers, chills, nausea, vomiting, or diarrhea. She was using Hydrofera Blue last week. 06/20/2020 06/20/2020 on evaluation today patient actually appears to be doing excellent in regard to her wound. This is measuring better and looking much better as well. She has been using the collagen that seems to be doing better for her as well even though the [facility] was and is not sticking or feeling as rough on her wound. She did see Dr. [name] on Friday he is very pleased he also stated none of the hardware has shifted. That  is great news 1/27; the patient has a small clean wound all that is remaining. I agree that this is too small to really consider an Apligraf. Under illumination the surface is looking quite good. We have been using collagen although the dimensions are not any better this week 2/2; the patient has a small clean wound on the left medial ankle. Although this left of her substantial original areas. Measurements are smaller. We have been using polymen Ag under an Haematologist. 2/10; small area on the left medial ankle. This looks clean nothing to debride however dimensions are about the same we have been using polymen I think now for 2 weeks 2/17; not much change in surface area. We have been using polymen Ag without any improvement. 3/17; 1 month follow-up. The patient has been using endoform without any improvement in fact I think this looks worse with more depth and more expansion 3/24; no improvement. Perhaps less debris on the surface. We have been using Sorbact for 1 week 4/4; wound measures larger. She has edema in her leg and her foot which she tells as her wrap came down. We have been  using Unna boots. Sorbact of the wound. She has been approved for Apligraf 09/12/2020 upon evaluation today patient appears to be doing well with regard to her wound. We did get the Apligraf reapproved which is great news we have that available for application today. Fortunately there is no signs of infection and overall the patient seems to be doing great. The wound bed is nice and clean. 4/27; patient presents for her second application of Apligraf. She states over the past week she has been on her feet more often due to being outside in her garden. She has noted more swelling to her foot as a result. She denies increased warmth, pain or erythema to the wound site. 10/10/2020 upon evaluation today patient appears to be doing well with regard to her wound which does not appear to be quite as irritated as last week from what I am hearing. With that being said unfortunately she is having issues with some erythema and warmth to touch as well as an increase size. I do believe this likely is infected. 10/17/2020 upon evaluation today patient appears to be doing excellent in regard to her wound this is significantly improved as compared to last week. Fortunately I think that the infection is much better controlled at this point. She did have evidence of both Enterococcus as well as Staphylococcus noted on culture. Enterococcus really would not be helped significantly by the Cipro but the wound is doing so much better I am under the assumption that the Staphylococcus is probably the main organism that is causing the current infection. Nonetheless I think that she is doing excellent as far as that is concerned and I am very pleased in that regard. I would therefore recommend she continue with the Cipro. 10/31/2020 upon evaluation today patient appears to be doing well with regard to her wound. She has been tolerating the dressing changes without complication. Fortunately there is no signs of active infection and  overall I am extremely pleased with where things stand today. No fevers, chills, nausea, vomiting, or diarrhea. With that being said she does have some green drainage coming from the wound and although it looks okay I am a little concerned about the possibility of a continuing infection. Specifically with Pseudomonas. For that reason I will go ahead and send in a prescription for Cipro for her to  be continued. 11/14/2020 upon evaluation today patient appears to be doing very well currently in regard to her wound on her leg. She has been tolerating the dressing changes without complication. Fortunately I feel like the infection is finally under good control here. Unfortunately we do not have the Apligraf for application today although we can definitely order to have it in place for next week. That will be her fifth and final of the current series. Nonetheless I feel like her wound is really doing quite well which is great news. 11/21/2020 upon evaluation today patient appears to be doing well with regard to her wound on the medial ankle. Fortunately I think the infection is under control and I do believe we can go ahead and reapply the Apligraf today. She is in agreement with that plan. There does not appear to be any signs of active infection at this time which is great news. No fevers, chills, nausea, vomiting, or diarrhea. 12/05/2020 upon evaluation today patient's wound bed actually showed signs of good granulation epithelization at this point. There does not appear to be any signs of infection which is great news and overall very pleased with where things stand. Overall the patient seems to be doing fairly well in my opinion with regard to her wound although I do believe she continues to build up a lot of biofilm I think she could benefit from using PuraPly at this point. 12/12/2020 upon evaluation today patient's wound actually appears to be doing decently well today. The Unna boot has not been quite as  well-tolerated so that more uncomfortable for her and even causing some pressure over the plate on the lateral portion of her foot which is 90 where the wound is. There did not appear to be any significant deep tissue injury with that there may be a minimal change in the skin noted I think that we may want to go back to the Coflex 2 layer which is a little bit easier on her skin it seems. 12/19/2020 upon evaluation today patient actually seems to be making great progress with the PuraPly currently. She in fact seems to be much better as far as the overall appearance of the wound bed is concerned I am very happy in this regard. I do not see any signs of of infection which is great news as well. No fevers, chills, nausea, vomiting, or diarrhea. 12/26/2020 upon evaluation today patient appears to actually be doing better in regard to her wound on the left medial ankle region. The surface of the wound is actually doing significantly better which is great news. There does not appear to be any signs of infection which is also great news and in general I am extremely pleased with where we stand today. 01/02/2021 upon evaluation today patient appears to be doing well with regard to her wound. In fact this is showing signs of excellent improvement and very pleased with where things stand. In fact the last 3 appointments have all shown signs of this getting smaller which is excellent news. I have not even had to perform any debridement and today is no exception. Overall I feel like this is dramatically improved compared to previous. T oday is PuraPly application #4. 5/57/3220 upon evaluation today patient appears to be doing excellent in regard to her wound this is continue to show signs of improvement and overall I am extremely pleased with where we stand today. She is actually here for PuraPly application #5. Every time we have applied this  she is noted definite improvement on measurements. 01/23/2021 upon  evaluation today patient is actually making good progress in regard to her wound. This was actually on just a little bit longer this time compared to previous due to the fact that she did have to go out of town. She is actually here for PuraPly application #6. We have definitely been seeing improvements in the overall quality of the tissue on the surface of the wound which is awesome news. In general I think that the patient seems to be continuing to make great progress here. 01/30/2021 upon evaluation today patient's wound is actually doing excellent. There is really not any significant biofilm buildup which is great news and overall I am extremely pleased with where things stand today. There does not appear to be any signs of active infection. No fevers, chills, nausea, vomiting, or diarrhea. 02/06/2021 upon evaluation today patient's wound is actually showing signs of excellent improvement which is great news. We continue to see the benefit of the PuraPly this is doing a great job the wound seems not really irritated whatsoever and is showing signs of good granulation at this point. Overall I am extremely happy with what we are seeing. The patient likewise is happy to hear all of this as well. 02/13/2021 upon evaluation today patient appears to be doing well with regard to her wound. This again is measuring smaller today and I am very pleased in this regard. Fortunately there does not appear to be any signs of active infection at this time which is good news from a systemic standpoint. Locally there is still some significant drainage which she does have is concerned about infection locally. No fevers, chills, nausea, vomiting, or diarrhea. 02/20/2021 upon evaluation today patient actually appears to be doing decently well in regard to her wound. She has been tolerating the dressing changes without complication. I do believe the PuraPly is helping wound bed does appear to be doing better. There is no  evidence of active infection locally or systemically at this point visually although on fluorescence imaging there still appear to be bacterial load and bioburden noted. 02/27/2021 upon evaluation today patient fortunately continues to show signs of improvement with use of the PuraPly currently. Subsequently we did review her culture results and to be honest I think that the Bactrim is probably the best option to have her continue at this point. For that reason I am get a go ahead and send in a refill today for her for an additional 2 weeks. Nonetheless I think that we are making excellent progress here. It was Enterococcus and Staphylococcus that were noted she is allergic to penicillin so there is not much I can do from the Enterococcus standpoint though the staff does seem to be sensitive to the Bactrim. 03/06/2021 upon evaluation today patient appears to be doing well with regard to her wound. This is definitely showing signs of improvement which is great news and overall I am extremely pleased with where things stand today. There does not appear to be any signs of active infection which is great news and overall and I do believe that we are headed in the appropriate direction I think the PuraPly is doing an awesome job for her. 03/20/2021 upon evaluation today patient actually appears to be doing well with regard to her wound. We have been using the PuraPly although last week when I was out of the office they actually just used endoform nonetheless this still seems to be doing great.  Fortunately there does not appear to be any signs of active infection which is great news overall I think the patient is making wonderful progress. 03/27/2021 upon evaluation today patient appears to be doing well with regard to her wound in fact there is a little drainage but otherwise her does not appear to be any major signs of open wounds nor infection at this time. Overall I am extremely pleased with where things  stand. No fevers, chills, nausea, vomiting, or diarrhea. Electronic Signature(s) Signed: 03/27/2021 11:06:05 AM By: [name] PA-C Entered By: [name] on 03/27/2021 11:06:05 -------------------------------------------------------------------------------- Physical Exam Details Patient Name: Date of Service: [facility] [name], [name] 03/27/2021 10:00 A M Medical Record Number: [id number] Patient Account Number: [id number] Date of Birth/Sex: Treating RN: June 29, 1955 (65 y.o. [name] Provider: [name] Other Clinician: Referring Provider: Treating Provider/Extender: [name] in Treatment: 39 Constitutional Well-nourished and well-hydrated in no acute distress. Respiratory normal breathing without difficulty. Psychiatric this patient is able to make decisions and demonstrates good insight into disease process. Alert and Oriented x 3. pleasant and cooperative. Notes Upon inspection patient's wound bed showed signs of good granulation epithelization at this point. I did clear away some of the drainage and this appears to be doing quite well there may be a tiny pinpoint opening center but overall this seems to be healing quite nicely. I think just a small alginate dressing may be the best way to go for this week. Electronic Signature(s) Signed: 03/27/2021 11:06:31 AM By: [name] PA-C Entered By: [name] on 03/27/2021 11:06:31 -------------------------------------------------------------------------------- Physician Orders Details Patient Name: Date of Service: [name], [name] 03/27/2021 10:00 A M Medical Record Number: [id number] Patient Account Number: [id number] Date of Birth/Sex: Treating RN: April 07, 1956 (65 y.o. 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 Margarita Grizzle Return A Bathing/ Shower/ Hygiene May shower with protection but do not get wound dressing(s) wet. Edema Control - Lymphedema / SCD / Other Elevate legs to the level of the heart or above for 30 minutes daily and/or when sitting, a frequency of: - throughout the day Avoid standing for long periods of time. Exercise regularly Compression stocking or Garment 20-30 mm/Hg pressure to: - right leg daily Additional Orders / Instructions Follow Nutritious Diet Wound Treatment Wound #15 - Malleolus Wound Laterality: Left, Medial Cleanser: Soap and Water 1 x Per Week/30 Days Discharge Instructions: May shower and wash wound with dial antibacterial soap and water prior to dressing change. Peri-Wound Care: Triamcinolone 15 (g) 1 x Per Week/30 Days Discharge Instructions: Use triamcinolone 15 (g) as directed Peri-Wound Care: Sween Lotion (Moisturizing lotion) 1 x Per Week/30 Days Discharge Instructions: Apply moisturizing lotion as directed Prim Dressing: KerraCel Ag Gelling Fiber Dressing, 2x2 in (silver alginate) 1 x Per Week/30 Days ary Discharge Instructions: Apply silver alginate to wound bed as instructed Secondary Dressing: Woven Gauze Sponge, Non-Sterile 4x4 in 1 x Per Week/30 Days Discharge Instructions: to bolster skin sub Secondary Dressing: ABD Pad, 5x9 1 x Per Week/30  Days Discharge Instructions: Apply over primary dressing and lateral ankle Secondary Dressing: Optifoam Non-Adhesive Dressing, 4x4 in 1 x Per Week/30 Days Discharge Instructions: Apply over primary dressing pad lateral foot with  foam donut Compression Wrap: CoFlex TLC XL 2-layer Compression System 4x7 (in/yd) 1 x Per Week/30 Days Discharge Instructions: Apply CoFlex 2-layer compression as directed. (alt for 4 layer) Electronic Signature(s) Signed: 03/27/2021 4:55:06 PM By: [name] PA-C Signed: 03/27/2021 5:09:33 PM By: Baruch Gouty RN, BSN Entered By: Baruch Gouty on 03/27/2021 10:44:55 -------------------------------------------------------------------------------- Problem List Details Patient Name: Date of Service: [name], [name] 03/27/2021 10:00 A M Medical Record Number: [id number] Patient Account Number: [id number] Date of Birth/Sex: Treating RN: 09/23/1955 (65 y.o. 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 01/17/2020 No Yes Inactive Problems ICD-10 Code Description Active Date Inactive Date L03.116 Cellulitis of left lower limb 01/31/2020 01/31/2020 T81.31XD Disruption of external operation (surgical) wound, not elsewhere classified, subsequent 01/17/2020 01/17/2020 encounter Resolved Problems Electronic Signature(s) Signed: 03/27/2021 10:05:12 AM By: [name] PA-C Entered By: [name] on 03/27/2021 10:05:11 -------------------------------------------------------------------------------- Progress Note Details Patient Name: Date of Service: [facility] [name], [name] 03/27/2021 10:00 A M Medical Record Number: [id number] Patient Account Number: [id number] Date  of Birth/Sex: Treating RN: 1955-09-17 (65 y.o. [name] Provider: [name] Other Clinician: Referring Provider: Treating Provider/Extender: [name] in Treatment: 53 Subjective Chief Complaint Information obtained from Patient patient is been followed long-term in this clinic for venous insufficiency ulcers with inflammation, hypertension and ulceration over the medial ankle bilaterally. 01/17/2020; this is a patient who is here for review of postoperative wounds on the left lateral ankle and recurrence of venous stasis ulceration on the left medial History of Present Illness (HPI) the remaining wound is over the left medial ankle. Similar wound over the right medial ankle healed largely with use of Apligraf. Most recently we have been using Hydrofera Blue over this wound with considerable improvement. The patient has been extensively worked up in the past for her venous insufficiency and she is not a candidate for antireflux surgery although I have none of the details available currently. 08/24/14; considerable improvement today. About 50% of this wound areas now epithelialized. The base of the wound appears to be healthier granulation.as opposed to last week when she had deteriorated a considerable improvement 08/17/14; unfortunately the wound has regressed somewhat. The areas of epithelialization from the superior aspect are not nearly as healthy as they were last week. The patient thinks her Hydrofera Blue slipped. 09/07/14; unfortunately the area has markedly regressed in the 2 weeks since I've seen this. There is an odor surrounding erythema. The healthy granulation tissue that we had at the base of the wound now is a dusky color. The nurse reports green drainage 09/14/14; the area looks somewhat better than last week. There is less erythema and less drainage. The culture I did did not show any growth. Nevertheless I think it is better to  continue the Cipro and doxycycline for a further week. The remaining wound  area was debridement. 09/21/14. Wound did not require debridement last week. Still less erythema and less drainage. She can complete her antibiotics. The areas of epithelialization in the superior aspect of the wound do not look as healthy as they did some weeks ago 10/05/14 continued improvement in the condition of this wound. There is advancing epithelialization. Less aggressive debridement required 10/19/14 continued improvement in the condition and volume of this wound. Less aggressive debridement to the inferior part of this to remove surface slough and fibrinous eschar 11/02/14 no debridement is required. The surface granulation appears healthy although some of her islands of epithelialization seem to have regressed. No evidence of infection 11/16/14; lites surface debridement done of surface eschar. The wound does not look to be unhealthy. No evidence of infection. Unfortunately the patient has had podiatry issues in the right foot and for some reason has redeveloped small surface ulcerations in the medial right ankle. Her original presentation involved wounds in this area 11/23/14 no debridement. The area on the right ankle has enlarged. The left ankle wound appears stable in terms of the surface although there is periwound inflammation. There has been regression in the amount of new skin 11/30/14 no debridement. Both wound areas appear healthy. There was no evidence of infection. The the new area on the right medial ankle has enlarged although that both the surfaces appear to be stable. 12/07/14; Debridement of the right medial ankle wound. No no debridement was done on the left. 12/14/14 no major change in and now bilateral medial ankle wounds. Both of these are very painful but the no overt evidence of infection. She has had previous venous ablation 12/21/14; patient states that her right medial ankle wound is considerably  more painful last week than usual. Her left is also somewhat painful. She could not tolerate debridement. The right medial ankle wound has fibrinous surface eschar 12/28/14 this is a patient with severe bilateral venous insufficiency ulcers. For a considerable period of time we actually had the one on the right medial ankle healed however this recently opened up again in June. The left medial ankle wound has been a refractory area with some absent flows. We had some success with Hydrofera Blue on this area and it literally closed by 50% however it is recently opened up Foley. Both of these were debridement today of surface eschar. She tolerates this poorly 01/25/15: No change in the status of this. Thick adherent escar. Very poor tolerance of any attempt at debridement. I had healed the right medial malleolus wound for a considerable amount of time and had the left one down to about 50% of the volume although this is totally regressed over the last 48 weeks. Further the right leg has reopened. she is trying to make a appointment with pain and vascular, previous ablations with Dr. Aleda Grana. I do not believe there is an arterial insufficiency issue here 02/01/15 the status of the adherent eschar bilaterally is actually improved. No debridement was done. She did not manage to get vascular studies done 02/08/15 continued debridement of the area was done today. The slough is less adherent and comes off with less pressure. There is no surrounding infection peripheral pulses are intact 02/15/15 selective debridement with a disposable curette. Again the slough is less adherent and comes off with less difficulty. No surrounding infection peripheral pulses are intact. 02/22/15 selective debridement of the right medial ankle wound. Slough comes off with less difficulty. No obvious surrounding infection peripheral pulses are intact I did not  debridement the one on the left. Both of these are stable to  improved 03/01/15 selective debridement of both wound areas using a curette to. Adherent slough cup soft with less difficulty. No obvious surrounding infection. The patient tells me that 2 days ago she noted a rash above the right leg wrap. She did not have this on her lower legs when she change this over she arrives with widespread left greater than right almost folliculitis-looking rash which is extremely pruritic. I don't see anything to culture here. There is no rash on the rest of her body. She feels well systemically. 03/08/15; selective debridement of both wounds using a curette. Base of this does not look unhealthy. She had limegreen drainage coming out of the left leg wound and describes a lot of drainage. The rash on her left leg looks improved to. No cultures were done. 03/22/15; patient was not here last week. Basal wounds does not look healthy and there is no surrounding erythema. No drainage. There is still a rash on the left leg that almost looks vasculitic however it is clearly limited to the top of where the wrap would be. 04/05/15; on the right required a surgical debridement of surface eschar and necrotic subcutaneous tissue. I did not debridement the area on the left. These continue to be large open wounds that are not changing that much. We were successful at one point in healing the area on the right, and at the same time the area on the left was roughly half the size of current measurements. I think a lot of the deterioration has to do with the prolonged time the patient is on her feet at work 04/19/15 I attempted-like surface debridement bilaterally she does not tolerate this. She tells me that she was in allergic care yesterday with extreme pain over her left lateral malleolus/ankle and was told that she has an "sprain" 05/03/15; large bilateral venous insufficiency wounds over the medial malleolus/medial aspect of her ankles. She complains of copious amounts of drainage and his  usual large amounts of pain. There is some increasing erythema around the wound on the right extending into the medial aspect of her foot to. historically she came in with these wounds the right one healed and the left one came down to roughly half its current size however the right one is reopened and the left is expanded. 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 05/10/15 large bilateral venous insufficiency wounds. There is less adherence surface left however the surface culture that I did last week grew pseudomonas therefore bilateral selective debridement score necessary. There is surrounding erythema. The patient describes severe bilateral drainage and a lot of pain in the left ankle. Apparently her podiatrist was were ready to do a cortisone shot 05/17/15; the patient complains of pain and again copious amounts of drainage. 05/24/15; we used Iodo flex last week. Patient notes considerable improvement in wound drainage. Only needed to change this once. 05/31/15; we continued Iodoflex; the base of these large wounds bilaterally is not too bad but there is probably likely a significant bioburden here. I would like to debridement just doesn't tolerate it. 06/06/14 I would like to continue the Iodoflex although she still hasn't managed to obtain supplies. She has bilateral medial malleoli or large wounds which are mostly superficial. Both of them are covered circumferentially with some nonviable fibrinous slough although she tolerates debridement very poorly. She apparently has  an appointment for an ablation on the right leg by interventional radiology. 06/14/15; the patient arrives with the wounds and static condition. We attempted a debridement although she does not do well with this secondary to pain. I 07/05/15; wounds are not much smaller however there appears to be a cleaner granulating base. The left has tight fibrinous slough greater than the  right. Debridement is tolerated poorly due to pain. Iodoflex is done more for these wounds in any of the multitude of different dressings I have tried on the left 1 and then subsequently the right. 07/12/15; no change in the condition of this wound. I am able to do an aggressive debridement on the right but not the left. She simply cannot tolerate it. We have been using Iodoflex which helps somewhat. It is worthwhile remembering that at one point we healed the right medial ankle wound and the left was about 25% of the current circumference. We have suggested returning to vascular surgery for review of possible further ablations for one reason or another she has not been able to do this. 07/26/15 no major change in the condition of either wound on her medial ankle. I did not attempt to debridement of these. She has been aggressively scrubbing these while she is in the shower at home. She has her supply of Iodoflex which seems to have done more for these wounds then anything I have put on recently. 08/09/15 wound area appears larger although not verified by measurements. Using Iodoflex 09/05/2015 -- she was here for avisit today but had significant problems with the wound and I was asked to see her for a physician opinion. I have summarize that this lady has had surgery on her left lower extremity about 10 years ago where the possible veins stripping was done. She has had an opinion from interventional radiology around November 2016 where no further sclerotherapy was ordered. The patient works 12 hours a day and stands on a concrete floor with work boots and is unable to get the proper compression she requires and cannot elevate her limbs appropriately at any given time. She has recently grown Pseudomonas from her wound culture but has not started her ciprofloxacin which was called in for her. 09/13/15 this continues to be a difficult situation for this patient. At one point I had this wound down to a 1.5 x  1.5" wound on her left leg. This is deteriorated and the right leg has reopened. She now has substantial wounds on her medial calcaneus, malleoli and into her lower leg. One on the left has surface eschar but these are far too painful for me to debridement here. She has a vascular surgery appointment next week to see if anything can be done to help here. I think she has had previous ablations several years ago at [facility]. She has no major edema. She tells me that she did not get product last time [facility] Ag] and went for several days without it. She continues to work in work boots 12 hours a day. She cannot get compression/4-layer under her work boots. 09/20/15 no major change. Periwound edema control was not very good. Her point with pain and vascular is next Wednesday the 25th 09/28/15; the patient is seen vascular surgery and is apparently scheduled for repeat duplex ultrasounds of her bilateral lower legs next week. 10/05/15; the patient was seen by Dr. Doren Custard of vascular surgery. He feels that she should have arterial insufficiency excluded as cause/contributed to her nonhealing stage she is  therefore booked for an arteriogram. She has apparently monophasic signals in the dorsalis pedis pulses. She also of course has known severe chronic venous insufficiency with previous procedures as noted previously. I had another long discussion with the patient today about her continuing to work 12 hour shifts. I've written her out for 2 months area had concerns about this as her work location is currently undergoing significant turmoil and this may lead to her termination. She is aware of this however I agree with her that she simply cannot continue to stand for 12 hours multiple days a week with the substantial wound areas she has. 10/19/15; the Dr. Doren Custard appointment was largely for an arteriogram which was normal. She does not have an arterial issue. He didn't make a comment about her chronic venous  insufficiency for which she has had previous ablations. Presumably it was not felt that anything additional could be done. The patient is now out of work as I prescribed 2 weeks ago. Her wounds look somewhat less aggravated presumably because of this. I felt I would give debridement another try today 10/25/15; no major change in this patient's wounds. We are struggling to get her product that she can afford into her own home through her insurance. 11/01/15; no major change in the patient's wounds. I have been using silver alginate as the most affordable product. I spoke to Dr. Marla Roe last week with her requested take her to the OR for surgical debridement and placement of ACEL. Dr. Marla Roe told me that she would be willing to do this however [facility] will not cover this, fortunately the patient has [place] healthcare of some variant 11/08/15; no major change in the patient's wounds. She has been completely nonviable surface that this but is in too much pain with any attempted debridement are clinic. I have arranged for her to see Dr. Marla Roe ham of plastic surgery and this appointment is on Monday. I am hopeful that they will take her to the OR for debridement, possible ACEL ultimately possible skin graft 11/22/15 no major change in the patient's wounds over her bilateral medial calcaneus medial malleolus into the lower legs. Surface on these does not look too bad however on the left there is surrounding erythema and tenderness. This may be cellulitis or could him sleepy tinea. 11/29/15; no major changes in the patient's wounds over her bilateral medial malleolus. There is no infection here and I don't think any additional antibiotics are necessary. There is now plan to move forward. She sees Dr. Marla Roe in a week's time for preparation for operative debridement and ACEL placement I believe on 7/12. She then has a follow-up appointment with Dr. Marla Roe on 7/21 12/28/15; the  patient returns today having been taken to the [place] by Dr. Marla Roe 12/12/15 she underwent debridement, intraoperative cultures [which were negative]. She had placement of a wound VAC. Parent really ACEL was not available to be placed. The wound VAC foam apparently adhered to the wound since then she's been using silver alginate, Xeroform under Ace wraps. She still says there is a lot of drainage and a lot of pain 01/31/16; this is a patient I see monthly. I had referred her to Dr. Marla Roe him of plastic surgery for large wounds on her bilateral medial ankles. She has been to the OR twice once in early July and once in early August. She tells me over the last 3 weeks she has been using the wound VAC with ACEL underneath it. On the  right we've simply been using silver alginate. Under Kerlix Coban wraps. 02/28/16; this is a patient I'm currently seeing monthly. She is gone on to have a skin graft over her large venous insufficiency ulcer on the left medial ankle. This was done by Dr. Marla Roe him. The patient is a bit perturbed about why she didn't have one on her right medial ankle wound. She has been using silver alginate to this. 03/06/16; I received a phone call from her plastic surgery Dr. Marla Roe. She expressed some concern about the viability of the skin graft she did on the left medial ankle wound. Asked me to place Endoform on this. She told me she is not planning to do a subsequent skin graft on the right as the left one did not take very well. I had placed Hydrofera Blue on the right 03/13/16; continue to have a reasonably healthy wound on the right medial ankle. Down to 3 mm in terms of size. There is epithelialization here. The area on the left medial ankle is her skin graft site. I suppose the last week this looks somewhat better. She has an open area inferiorly however in the center there appears to be some viable tissue. There is a lot of surface callus and eschar that will eventually  need to come off however none of this looked to be infected. Patient states that the is able to keep the dressing on for several days which is an improvement. 03/20/16 no major change in the circumference of either wound however on the left side the patient was at Dr. Eusebio Friendly office and they did a debridement of left wound. 50% of the wound seems to be epithelialized. I been using Endoform on the left Hydrofera Blue in the right 03/27/16; she arrives today with her wound is not looking as healthy as they did last week. The area on the right clearly has an adherent surface to this a very similar surface on the left. Unfortunately for this patient this is all too familiar problem. Clearly the Endoform is not working and will need to change that today that has some potential to help this surface. She does not tolerate debridement in this clinic very well. She is changing the dressing wants 04/03/16; patient arrives with the wounds looking somewhat better especially on the right. Dr. Migdalia Dk change the dressing to silver alginate when she saw her on Monday and also sold her some compression socks. The usefulness of the latter is really not clear and woman with severely draining wounds. 04/10/16; the patient is doing a bit of an experiment wearing the compression stockings that Dr. Migdalia Dk provided her to her left leg and the out of legs based dressings that we provided to the right. 05/01/16; the patient is continuing to wear compression stockings Dr. Migdalia Dk provided her on the left that are apparently silver impregnated. She has been using Iodoflex to the right leg wound. Still a moderate amount of drainage, when she leaves here the wraps only last for 4 days. She has to change the stocking on the left leg every night 05/15/16; she is now using compression stockings bilaterally provided by Dr. Marla Roe. She is wearing a nonadherent layer over the wounds so really I don't think there is anything  specific being done to this now. She has some reduction on the left wound. The right is stable. I think all healing here is being done without a specific dressing 06/09/16; patient arrives here today with not much change in the wound  certainly in diameter to large circular wounds over the medial aspect of her ankle bilaterally. Under the light of these services are certainly not viable for healing. There is no evidence of surrounding infection. She is wearing compression stockings with some sort of silver impregnation as prescribed by Dr. Marla Roe. She has a follow-up with her tomorrow. 06/30/16; no major change in the size or condition of her wounds. These are still probably covered with a nonviable surface. She is using only her purchase stockings. She did see Dr. Marla Roe who seemed to want to apply Dakin's solution to this I'm not extreme short what value this would be. I would suggest Iodoflex which she still has at home. 07/28/16; I follow Mrs. Cammon episodically along with Dr. Marla Roe. 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 She has been applying a topical collagen ointment to both wounds with Adaptic. I don't think Dr. Marla Roe is planning to take her back to the OR. 08/19/16; I follow Mrs. Jeneen Rinks on a monthly basis along with Dr. Marla Roe of plastic surgery. 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 I been following her for a number of years. At one point I was able to get the right medial malleolus wound to heal and had the left medial malleolus down to about half its current size however and I had to send her to plastic surgery for an operative debridement. Since then things have been stable to slightly improve the area on the right is slightly better one in the left about the same although there is much less adherent surface than I'm used to with this  patient. She is using some form of liquid collagen gel that Dr. Marla Roe provided a Kerlix cover with the patient's own pressure stockings. She tells me that she has extreme pain in both ankles and along the lateral aspect of both feet. She has been unable to work for some period of time. She is telling me she is retiring at the beginning of April. She sees Dr. Doran Durand of orthopedics next week 09/22/16; patient has not seen Dr. Marla Roe since the last time she is here. I'm not really sure what she is using to the wounds other than bits and pieces of think she had left over including most recently Hydrofera Blue. She is using juxtalite stockings. She is having difficulty with her husband's recent illness "stroke". She is having to transport him to various doctors appointments. Dr. Marla Roe left her the option of a repeat debridement with ACEL however she has not been able to get the time to follow-up on this. She continues to have a fair amount of drainage out of these wounds with certainly precludes leaving dressings on all week 10/13/16; patient has not seen Dr. Marla Roe since she was last in our clinic. I'm not really sure what she is doing with the wounds, we did try to get her [facility] and I think she is actually using this most of the time. Because of drainage she states she has to change this every second day although this is an improvement from what she used to do. She went to see Dr. Doran Durand who did not think she had a muscular issue with regards to her feet, he referred her to a neurologist and I think the appointment is sometime in June. I changed her back to Iodoflex which she has used in the past but not recently.  11/03/16; the patient has been using Iodoflex although she ran out of this. Still claims that there is a lot of drainage although the wound does not look like this. No surrounding erythema. She has not been back to see Dr. Marla Roe 11/24/16; the patient has been using  Iodoflex again but she ran out of it 2 or 3 days ago. There is no major change in the condition of either one of these wounds in fact they are larger and covered in a thick adherent surface slough/nonviable tissue especially on the left. She does not tolerate mechanical debridement in our clinic. Going back to see Dr. Marla Roe of plastic surgery for an operative debridement would seem reasonable. 12/15/16; the patient has not been back to see Dr. Marla Roe. She is been dealing with a series of illnesses and her husband which of monopolized her time. She is been using Sorbact which we largely supplied. She states the drainage is bad enough that it maximum she can go 2-3 days without changing the dressing 01/12/2017 -- the patient has not been back for about 4 weeks and has not seen Dr. Marla Roe not does she have any appointment pending. 01/23/17; patient has not seen Dr. Marla Roe even though I suggested this previously. She is using Santyl that was suggested last week by Dr. Con Memos this Cost her $16 through her insurance which is indeed surprising 02/12/17; continuing Santyl and the patient is changing this daily. A lot of drainage. She has not been back to see plastic surgery she is using an Ace wrap. Our intake nurse suggested wrap around stockings which would make a good reasonable alternative 02/26/17; patient is been using Santyl and changing this daily due to drainage. She has not been to see plastic surgery she uses in April Ace wrap to control the edema. She did obtain extremitease stockings but stated that the edema in her leg was to big for these 03/20/17; patient is using Santyl and Anasept. Surfaces looked better today the area on the right is actually measuring a little smaller. She has states she has a lot of pain in her feet and ankles and is asking for a consult to pain control which I'll try to help her with through our case manager. 04/10/17; the patient arrives with better-looking  wound surfaces and is slightly smaller wound on the left she is using a combination of Santyl and Anasept. She has an appointment or at least as started in the pain control center associated with [facility] 05/14/17; this is a patient who I followed for a prolonged period of time. She has venous insufficiency ulcers on her bilateral medial ankles. At one point I had this down to a much smaller wound on the left however these reopened and we've never been able to get these to heal. She has been using Santyl and Anasept gel although 2 weeks ago she ran out of the Anasept gel. She has a stable appearance of the wound. She is going to the wound care clinic at [facility]. They wanted do a nerve block/spinal block although she tells me she is reluctant to go forward with that. 05/21/17; this is a patient I have followed for many years. She has venous insufficiency ulcers on her bilateral medial ankles. Chronic pain and deformity in her ankles as well. She is been to see plastic surgery as well as orthopedics. Using PolyMem AG most recently/Kerramax/ABDs and 2 layer compression. She has managed to keep this on and she is coming in for  a nurse check to change the dressing on Tuesdays, we see her on Fridays 06/05/17; really quite a good looking surface and the area especially on the right medial has contracted in terms of dimensions. Well granulated healthy-looking tissue on both sides. Even with an open curet there is nothing that even feels abnormal here. This is as good as I've seen this in quite some time. We have been using PolyMem AG and bringing her in for a nurse check 06/12/17; really quite good surface on both of these wounds. The right medial has contracted a bit left is not. We've been using PolyMem and AG and she is coming in for a nurse visit 06/19/17; we have been using PolyMem AG and bringing her in for a nurse check. Dimensions of her wounds are not better but the surfaces looked  better bilaterally. She complained of bleeding last night and the left wound and increasing pain bilaterally. She states her wound pain is more neuropathic than just the wounds. There was some suggestion that this was radicular from her pain management doctor in talking to her it is really difficult to sort this out. 06/26/17; using PolyMem and AG and bringing her in for a nurse check as All of this and reasonably stable condition. Certainly not improved. The dimensions on the lateral part of the right leg look better but not really measuring better. The medial aspect on the left is about the same. 07/03/16; we have been using PolyMen AG and bringing her in for a nurse check to change the dressings as the wounds have drainage which precludes once weekly changing. We are using all secondary absorptive dressings.our intake nurse is brought up the idea of using a wound VAC/snap VAC on the wound to help with the drainage to see if this would result in some contraction. This is not a bad idea. The area on the right medial is actually looking smaller. Both wounds have a reasonable-looking surface. There is no evidence of cellulitis. The edema is well controlled 07/10/17; the patient was denied for a snap VAC by her insurance. The major issue with these wounds continues to be drainage. We are using wicked PolyMem AG and she is coming in for a nurse visit to change this. The wounds are stable to slightly improved. The surface looks vibrant and the area on the right certainly has shrunk in size but very slowly 07/17/17; the patient still has large wounds on her bilateral medial malleoli. Surface of both of these wounds looks better. The dimensions seem to come and go but no consistent improvement. There is no epithelialization. We do not have options for advanced treatment products due to insurance issues. They did not approve of the wound VAC to help control the drainage. More recently we've been using PolyMem and  AG wicked to allow drainage through. We have been bringing her in for a nurse visit to change this. We do not have a lot of options for wound care products and the home again due to insurance issues 07/24/17; the patient's wound actually looks somewhat better today. No drainage measurements are smaller still healthy-looking surface. We used silver collagen under PolyMen started last week. We have been bringing her in for a dressing change 07/31/17; patient's wound surface continued to look better and I think there is visible change in the dimensions of the wound on the right. Rims of epithelialization. We have been using silver collagen under PolyMen and bringing her in for a dressing change. There appears  to be less drainage although she is still in need of the dressing change 08/07/17. Patient's wound surface continues to look better on both sides and the area on the right is definitely smaller. We have been using silver collagen and PolyMen. She feels that the drainage has been it has been better. I asked her about her vascular status. She went to see Dr. Aleda Grana at [facility] and had some form of ablation. I don't have much detail on this. I haven't my notes from 2016 that she was not a candidate for any further ablation but I don't have any more information on this. We had referred her to vein and vascular I don't think she ever went. He does not have a history of PAD although I don't have any information on this either. We don't even have ABIs in our record 08/14/17; we've been using silver collagen and PolyMen cover. And putting the patient and compression. She we are bringing her in as a nurse visit to change this because ofarge amount of drainage. We didn't the ABIs in clinic today since they had been done in many moons 1.2 bilaterally. She has been to see vein and vascular however this was at [facility] and she had ablation although I really don't have any information on this all seemed  biking get a report. She is also been operatively debrided by plastic surgery and had a cell placed probably 8-12 months ago. This didn't have a major effect. We've been making some gains with current dressings 08/19/17-She is here in follow-up evaluation for bilateral medial malleoli ulcers. She continues to tolerate debridement very poorly. We will continue with recently changed topical treatment; if no significant improvement may consider switching to Iodosorb/Iodoflex. 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 She has been using silver collagen and PolyMem. I believe she has been used and tried on Iodoflex before. 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 These have been very refractory over time. We have been using silver collagen and PolyMen. She is coming in once a week for a doctor's and once a week for nurse visits. We are actually making some progress 09/18/17; the patient's wounds are smaller especially on the right medial. She arrives today to upset to consider even washing these off with Anasept which I think is been part of the reason this is been closing. We've been using collagen covered in PolyMen otherwise. It is noted that she has a small area of folliculitis on the right medial calf that. As we are wrapping her legs I'll give her a short course of doxycycline to make sure this doesn't amount to anything. She is a long list of complaints today including imbalance, shortness of breath on exertion, inversion of her left ankle. With regards to the latter complaints she is been to see orthopedics and they offered her a tendon release surgery I believe but wanted her wounds to be closed first. I have recommended she go see her primary physician with  regards to everything else. 09/25/17; patient's wounds are about the same size. We have made some progress bilaterally although not in recent weeks. She will not allow me T wash these wounds with Anasept even if she is doing her cell. Wheeze we've been using collagen covered in PolyMen.  Last week she had a small area of folliculitis this is now opened into a small wound. She completed 5 days of trimethoprim sulfamethoxazole 10/02/17; unfortunately the area on her left medial ankle is worse with a larger wound area towards the Achilles. The patient complains of a lot of pain. She will not allow debridement although visually I don't think there is anything to debridement in any case. We have been using silver collagen and PolyMen for several months now. Initially we are making some progress although I'm not really seeing that today. We will move back to [facility]. His admittedly this is a bit of a repeat however I'm hoping that his situation is different now. The patient tells me she had her leg on the left give out on her yesterday this is process some pain. 10/09/17; the patient is seen twice a week largely because of drainage issues coming out of the chronic medial bimalleolar wounds that are chronic. Last week the dimensions of the one on the left looks a little larger I changed her to [facility]. She comes in today with a history of terrible pain in the bilateral wound areas. She will not allow debridement. She will not even allow a tissue culture. There is no surrounding erythema no no evidence of cellulitis. We have been putting her Kerlix Coban man. She will not allow more aggressive compression as there was a suggestion to put her in 3 layer wraps. 10/16/17; large wounds on her bilateral medial malleoli. These are chronic. Not much change from last week. The surface looks have healthy but absolutely no epithelialization. A lot of pain little less so of drainage. She will not allow  debridement or even washing these off in the vigorous fashion with Anasept. 10/23/17; large wounds on her bilateral malleoli which are chronic. Some improvement in terms of size perhaps on the right since last time I saw these. She states that after we increased the 3 layer compression there was some bleeding, when she came in for a nurse visit she did not want 3 layer compression put back on about our nurse managed to convince her. She has known chronic venous visit issues and I'm hoping to get her to tolerate the 3 layer compression. using Hydrofera Blue 10/30/17; absolutely no change in the condition of either wound although we've had some improvement in dimensions on the right.. Attempted to put her in 3 layer compression she didn't tolerated she is back in 2 layer compression. We've been using Hydrofera Blue We looked over her past records. She had venous reflux studies in November 2016. There was no evidence of deep venous reflux on the right. Superficial vein did not show the greater saphenous vein at think this is been previously ablated the small saphenous vein was within normal limits. The left deep venous system showed no DVT the vessels were positive for deep venous reflux in the posterior tibial veins at the ankle. The greater saphenous vein was surgically absent small saphenous vein was within normal limits. She went to vein and vascular at [facility]. I believe she had an ablation on the left greater saphenous vein. I'll update her reflux studies perhaps ever reviewed by vein and vascular. We've made absolutely no progress in these wounds. Will also try to read and TheraSkins through her insurance 11/06/17; W the patient apparently has a 2 week follow-up with vein and vascular I like him to review the whole issue with regards to her previous vascular workup by Dr.  Featherstone. We've really made no progress on these wounds in many months. She arrives today with less viable looking  surface on the left medial ankle wound. This was apparently looking about the same on Tuesday when she was here for nurse visit. 11/13/17; deep tissue culture I did last time of the left lower leg showed multiple organisms without any predominating. In particular no Staphylococcus or group A strep were isolated. We sent her for venous reflux studies. She's had a previous left greater saphenous vein stripping and I think sclerotherapy of the right greater saphenous vein. She didn't really look at the lesser saphenous vein this both wounds are on the medial aspect. She has reflux in the common femoral vein and popliteal vein and an accessory vein on the right and the common femoral vein and popliteal vein on the left. I'm going to have her go to see vein and vascular just the look over things and see if anything else beside aggressive compression is indicated here. We have not been able to make any progress on these wounds in spite of the fact that the surface of the wounds is never look too bad. 11/20/17; no major change in the condition of the wounds. Patient reports a large amount of drainage. She has a lot of complaints of pain although enlisting her today I wonder if some of this at least his neuropathic rather than secondary to her wounds. She has an appointment with vein and vascular on 12/30/17. The refractory nature of these wounds in my mind at least need vein and vascular to look over the wounds the recent reflux studies we did and her history to see if anything further can be done here. I also note her gait is deteriorated quite a bit. 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 She has a bilateral Trendelenburg gait. I wonder if this is neuropathic or perhaps multilevel radicular. 11/27/17; her wounds actually looks slightly better. Healthy-looking granulation tissue a scant amount of epithelialization. [place] [facility] will not pay for [employer]. They will play for tri layer Oasis  and Dermagraft. This is not a diabetic ulcer. We'll try for the tri layer Oasis. She still complains of some drainage. She has a vein and vascular appointment on 12/30/17 12/04/17; the wounds visually look quite good. Healthy-looking granulation with some degree of epithelialization. We are still waiting for response to our request for trial to try layer Oasis. Her appointment with vascular to review venous and arterial issues isn't sold the end of July 7/31. Not allow debridement or even vigorous cleansing of the wound surface. 12/18/17; slightly smaller especially on the right. 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 We've been using Hydrofera Blue. We still are looking into triple layer Oasis through her insurance 01/08/18 on evaluation today patient's wound actually appears to be showing signs of good improvement at this point in time. She has been tolerating the dressing changes without complication. Fortunately there does not appear to be any evidence of infection at this point in time. We have been utilizing silver nitrate which does seem to be of benefit for her which is also good news. Overall I'm very happy with how things seem to be both regards appearance as well as measurement. Patient did see Dr. Bridgett Larsson for evaluation on 12/30/17. In his assessment he felt that stripping would not likely add much more than chronic compression to the patient's healing process. His recommendation was to follow-up in three months with Dr. Doren Custard  if she hasn't healed in order to consider referral back to you and see vascular where she previously was in a trial and was able to get her wound to heal. I'll be see what she feels she when you staying compression and he reiterated this as well. 01/13/18 on evaluation today patient appears to actually be doing very well in regard to her bilateral medial malleolus ulcers. She seems to have tolerated the chemical cauterization with silver nitrate  last week she did have some pain through that evening but fortunately states that I'll be see since it seems to be doing better she is overall pleased with the progress. 01/21/18; really quite a remarkable improvement since I've last seen these wounds. We started using silver nitrate specially on the islands of hyper granulation which for some reason her around the wound circumference. This is really done quite nicely. Primary dressing Hydrofera Blue under 4 layer compression. She seems to be able to hold out without a nurse rewrap. Follow-up in 1 week 01/28/18; we've continued the hydrofera blue but continued with chemical cauterization to the wound area that we started about a month ago for irregular hyper granulation. She is made almost stunning improvement in the overall wound dimensions. I was not really expecting this degree of improvement in these chronic wounds 02/05/18; we continue with Hydrofera Bluebut of also continued the aggressive chemical cauterization with silver nitrate. We made nice progress with the right greater than left wound. 02/12/18. We continued with Hydrofera Blue after aggressive chemical cauterization with silver nitrate. We appear to be making nice progress with both wound areas 02/19/2018; we continue with [facility] after washing the wounds vigorously with Anasept spray and chemical cauterization with silver nitrate. We are making excellent progress. The area on the right's just about closed 02/26/2018. The area on the left medial ankle had too much necrotic debris today. I used a #5 curette we are able to get most of the soft. I continued with the silver nitrate to the much smaller wound on the right medial ankle she had a new area on her right lower pretibial area which she says was due to a role in her compression 03/05/2018; both wound areas look healthy. Not much change in dimensions from last week. I continue to use silver nitrate and Hydrofera Blue. The patient  saw Dr. Doren Custard of vein and vascular. He felt she had venous stasis ulcers. He felt based on her previous arteriogram she should have adequate circulation for healing. Also she has deep venous reflux but really no significant correctable superficial venous reflux at this time. He felt we should continue with conservative management including leg elevation and compression 04/02/2018; since we last saw this woman about a month ago she had a fall apparently suffered a pelvic fracture. I did not look up the x-ray. Nevertheless because of pain she literally was bedbound for 2 weeks and had home health coming out to change the dressing. Somewhat predictably this is resulted in considerable improvement in both wound areas. The right is just about closed on the medial malleolus and the left is about half the size. 04/16/2018; both her wounds continue to go down in size. Using Hydrofera Blue. 05/07/18; both her wounds appeared to be improving especially on the right where it is almost closed. We are using Hydrofera Blue 05/14/2018; slightly worse this week with larger wounds. Surface on the left medial not quite as good. We have been using Hydrofera Blue 05/21/18; again the wounds are slightly  larger. Left medial malleolus slightly larger with eschar around the circumference. We have been using Hydrofera Blue undergoing a wraps for a prolonged period of time. This got a lot better when she was more recumbent due to a fall and a back injury. I change the primary dressing the silver alginate today. She did not tolerate a 4 layer compression previously although I may need to bring this up with her next time 05/28/2018; area on the left medial malleolus again is slightly larger with more drainage. Area on the right is roughly unchanged. She has a small area of folliculitis on the right medial just on the lower calf. This does not look ominous. 06/03/2018 left medial malleolus slightly smaller in a better looking surface.  We used silver nitrate on this last time with silver alginate. The area on the right appears slightly smaller 1/10; left medial malleolus slightly smaller. Small open area on the right. We used silver nitrate and silver alginate as of 2 weeks ago. We continue with the wound and compression. These got a lot better when she was off her feet 1/17; right medial malleolus wound is smaller. The left may be slightly smaller. Both surfaces look somewhat better. 1/24; both wounds are slightly smaller. Using silver alginate under Unna boots 1/31; both wounds appear smaller in fact the area on the right medial is just about closed. Surface eschar. We have been using silver alginate under Unna boots. The patient is less active now spends let much less time on her feet and I think this is contributed to the general improvement in the wound condition 2/7; both wounds appear smaller. I was hopeful the right medial would be closed however there there is still the same small open area. Slight amount of surface eschar on the left the dimensions are smaller there is eschar but the wound edges appear to be free. We have been using silver alginate under Unna boot's 2/14; both wounds once again measure smaller. Circumferential eschar on the left medial. We have been using silver alginate under Unna boots with gradual improvement 2/21; the area on the right medial malleolus has healed. The area on the left is smaller. We have been using silver alginate and Unna boots. We can discharge wrapping the right leg she has 20/30 stockings at home she will need to protect the scar tissue in this area 2/28; the area on the right medial malleolus remains closed the patient has a compression stocking. The area on the left is smaller. We have been using silver alginate and Unna boots. 3/6 the area on the right medial ankle remains closed. Good edema control noted she is using her own compression stocking. The area on the left medial  ankle is smaller. We have been managing this with silver alginate and Unna boots which we will continue today. 3/13; the area on the right medial ankle remains closed and I'm declaring it healed today. When necessary the left is about the same still a healthy-looking surface but no major change and wound area. No evidence of infection and using silver alginate under unna and generally making considerable improvement 3/27 the area on the right medial ankle remains closed the area on the left is about the same as last week. Certainly not any worse we have been using silver alginate under an Unna boot 4/3; the area on the right medial ankle remains closed per the patient. We did not look at this wound. The wound on the left medial ankle is about  the same surface looks healthy we have been using silver alginate under an Unna boot 4/10; area on the right medial ankle remains closed per the patient. We did not look at this wound. The wound on the left medial ankle is slightly larger. The patient complains that the [facility] caused burning pain all week. She also told us that she was a lot more active this week. Changed her back to silver alginate 4/17; right medial ankle still closed per the patient. Left medial ankle is slightly larger. Using silver alginate. She did not tolerate Hydrofera Blue on this area 4/24; right medial ankle remains closed we have not look at this. The left medial ankle continues to get larger today by about a centimeter. We have been using silver alginate under Unna boots. She complains about 4 layer compression as an alternative. She has been up on her feet working on her garden 5/8; right medial ankle remains closed we did not look at this. The left medial ankle has increased in size about 100%. We have been using silver alginate under Unna boots. She noted increased pain this week and was not surprised that the wound is deteriorated 5/15; no major change in SA however much  less erythema ( one week of doxy ocellulitis). 5/22-65 year old female returns at 1 week to the clinic for left medial ankle wound for which we have been using silver alginate under 3 layer compression She was placed on DOXY at last visit - the wound is wider at this visit. She is in 3 layer compression 5/29; change to [facility] last week. I had given her empiric doxycycline 2 weeks ago for a week. She is in 3 layer compression. She complains of a lot of pain and drainage on presentation today. 6/5; using Hydrofera Blue. I gave her doxycycline recently empirically for erythema and pain around the wound. Believe her cultures showed enterococcus which not would not have been well covered by doxycycline nevertheless the wound looks better and I don't feel specifically that the enterococcus needs to be covered. She has a new what looks like a wrap injury on her lateral left ankle. 6/12; she is using Hydrofera Blue. She has a new area on the left anterior lower tibial area. This was a wrap injury last week. 6/19; the patient is using Hydrofera Blue. She arrived with marked inflammation and erythema around the wound and tenderness. 12/01/18 on evaluation today patient appears to be doing a little bit better based on what I'm hearing from the standpoint of lassos evaluation to this as far as the overall appearance of the wound is concerned. Then sometime substandard she typically sees Dr. Dellia Nims. Nonetheless overall very pleased with the progress that she's made up to this point. No fevers, chills, nausea, or vomiting noted at this time. 7/10; some improvement in the surface area. Aggressively debrided last week apparently. I went ahead with the debridement today although the patient does not tolerate this very well. We have been using Iodoflex. Still a fair amount of drainage 7/17; slightly smaller. Using Iodoflex. 7/24; no change from last week in terms of surface area. We have been using Iodoflex.  Surface looks and continues to look somewhat better 7/31; surface area slightly smaller better looking surface. We have been using Iodoflex. This is under Unna boot compression 8/7-Patient presents at 1 week with Unna boot and Iodoflex, wound appears better 8/14-Patient presents at 1 week with Iodoflex, we use the Unna boot, wound appears to be stable better.Patient  is getting Botox treatment for the inversion of the foot for tendon release, Next week 8/21; we are using Iodoflex. Unna boot. The wound is stable in terms of surface area. Under illumination there is some areas of the wound that appear to be either epithelialized or perhaps this is adherent slough at this point I was not really clear. It did not wipe off and I was reluctant to debride this today. 8/28; we are using Iodoflex in an Unna boot. Seems to be making good improvement. 9/4; using Iodoflex and wound is slightly smaller. 9/18; we are using Iodoflex with topical silver nitrate when she is here. The wound continues to be smaller 10/2; patient missed her appointment last week due to GI issues. She left and Iodoflex based dressing on for 2 weeks. Wound is about the same size about the size of a dime on the left medial lower 10/9 we have been using Iodoflex on the medial left ankle wound. She has a new superficial probable wrap injury on the dorsal left ankle 10/16; we have been using Hydrofera Blue since last week. This is on the left medial ankle 10/23; we have been using Hydrofera Blue since 2 weeks ago. This is on the left medial ankle. Dimensions are better 11/6; using Hydrofera Blue. I think the wound is smaller but still not closed. Left medial ankle 11/13; we have been using Hydrofera Blue. Wound is certainly no smaller this week. Also the surface not as good. This is the remanent of a very large area on her left medial ankle. 11/20; using Sorbact since last week. Wound was about the same in terms of size although I was  disappointed about the surface debris 12/11; 3-week follow-up. Patient was on vacation. Wound is measuring slightly larger we have been using Sorbact. 12/18; wound is about the same size however surface looks better last week after debridement. We have been using Sorbact under compression 1/15 wound is probably twice the size of last time increased in length nonviable surface. We have been using Sorbact. She was running a mild fever and missed her appointment last week 1/22; the wound is come down in size but under illumination still a very adherent debris we have been Hydrofera Blue that I changed her to last week 1/29; dimensions down slightly. We have been using Hydrofera Blue 2/19 dimensions are the same however there is rims of epithelialization under illumination. Therefore more the surface area may be epithelialized 2/26; the patient's wound actually measures smaller. The wound looks healthy. We have been using Hydrofera Blue. I had some thoughts about running Apligraf then I still may do that however this looks so much better this week we will delay that for now 3/5; the wound is small but about the same as last week. We have been using Hydrofera Blue. No debridement is required today. 3/19; the wound is about the size of a dime. Healthy looking wound even under illumination. We have been using Hydrofera Blue. No mechanical debridement is necessary 3/26; not much change from last week although still looks very healthy. We have been using Hydrofera Blue under Unna boots Patient was offered an ankle fusion by podiatry but not until the wound heals with a proceed with this. 4/9; the patient comes in today with her original wound on the medial ankle looking satisfactory however she has some uncontrolled swelling in the middle part of her leg with 2 new open areas superiorly just lateral to the tibia. I think this was probably  a wrap issue. She said she felt uncomfortable during the week but did  not call in. We have been using Hydrofera Blue 4/16; the wound on the medial ankle is about the same. She has innumerable small areas superior to this across her mid tibia. I think this is probably folliculitis. She is also been working in the yard doing a lot of sweating 4/30; the patient issue on the upper areas across her mid tibia of all healed. I think this was excessive yard work if I remember. Her wound on the medial ankle is smaller. Some debris on this we have been using Hydrofera Blue under Unna boots 5/7; mid tibia. She has been using Hydrofera Blue under an Unna wrap. She is apparently going for her ankle surgery on June 3 10/28/19-Patient returns to clinic with the ankle wound, we are using Hydrofera Blue under Unna wrap, surgery is scheduled for her left foot for June 3 so she will be back for nurse visit next week READMISSION 01/17/2020 Mrs. Kingdon is a 65 year old woman we have had in this clinic for a long period of time with severe venous hypertension and refractory wounds on her medial lower legs and ankles bilaterally. 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 When she was off her legs for a prolonged period example when she fell and suffered a compression fracture things would heal up quite nicely. She is now retired and we managed to heal up the right medial leg wound. The left one was very tiny last time I saw this although still refractory. 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 It got to the point where this was interfering with her walking and she elected to proceed with a ankle arthrodesis to straighten her her ankle and leave her with a functional outcome for mobilization. The patient was referred to Dr. Doren Custard and really this took some time to arrange. Dr.  Doren Custard saw her on 12/07/2019. Once again he verified that she had no arterial issues. She had previously had an angiogram several years ago. Follow-up ABIs on the left showed an ABI of 1.12 with triphasic waveforms and a TBI of 0.92. She is felt to have chronic deep venous insufficiency but I do not think it was felt that anything could be done from about this from an ablation point of view. At the time Dr. Doren Custard saw this patient the wounds actually look closed via the pictures in his clinic. The patient finally underwent her surgery on 12/15/2019. This went reasonably well and there was a good anatomic outcome. She developed a small distal wound dehiscence on the lateral part of the surgical wound. However more problematically she is developed recurrence of the wound on the medial left ankle. There are actually 2 wounds here one in the distal lower leg and 1 pretty much at the level of the medial malleolus. It is a more distal area that is more problematic. She has been using Hydrofera Blue which started on Friday before this she was simply Ace wrapping. There was a culture done that showed Pseudomonas and she is on ciprofloxacin. A recent CNS on 8/11 was negative. The patient reports some pain but I generally think this is improving. She is using a cam boot completely nonweightbearing using a walker for pivot  transfers and a wheelchair 8/24; not much improvement unfortunately she has a surgical wound on the lateral part in the venous insufficiency wound medially. The bottom part of the medial insufficiency wound is still necrotic there is exposed tendon here. We have been using Hydrofera Blue under compression. Her edema control is however better 8/31; patient in for follow-up of his surgical wound on the lateral part of her left leg and chronic venous insufficiency ulcers medially. We put her back in compression last week. She comes in today with a complaint of 3 or 4 days worth of increasing pain. She  felt her cam walker was rubbing on the area on the back of her heel. However there is intense erythema seems more likely she has cellulitis. She had 2 cultures done when she was seeing podiatry in the postop. One of them in late July showed Pseudomonas and she received a course of ciprofloxacin the other was negative on 8/11 she is allergic to penicillin with anaphylactoid complaints of hives oral swelling via information in epic 9/9; when I saw this patient last week she had intense anterior erythema around her wound on the right lateral heel and ankle and also into the right medial heel. Some of this was no doubt drainage and her walker boot however I was convinced she had cellulitis. I gave her Levaquin and Bactrim she is finishing up on this now. She is following up with Dr. [name] he saw her yesterday. He is taken her out of the walking boot of course she is still nonweightbearing. Her x-ray was negative for any worrisome features such as soft tissue air etc. Things are a lot better this week. She has home health. We have been using Hydrofera Blue under an [employer] which she put back on yesterday. I did not wrap her last week 9/17; her surrounding skin looks a lot better. In fact the area on the left lateral ankle has just a scant amount of eschar. The only remaining wound is the large area on the left medial ankle. Probably about 60% of this is healthy granulation at the surface however she has a significant divot distally. This has adherent debris in it. I been using debridement and silver collagen to try and get this area to fill-in although I do not think we have made much progress this week 9/24; the patient's wound on the left medial ankle looks a lot better. The deeper divot area distally still requires debridement but this is cleaning up quite nicely we have been using silver collagen. The patient is complaining of swelling in her foot and is worried that that is contributing to the  nonhealing of the ankle wound. She is also complaining of numbness in her anterior toes 10/4; left medial ankle. The small area distally still has a divot with necrotic material that I have been debriding away. This has an undermining area. She is approved for Apligraf. She saw Dr. [name] her surgeon on 10/1. I think he declared himself is satisfied with the condition of things. Still nonweightbearing till the end of the month. 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 Her surgical wound is well closed. There is no evidence of infection 10/11; the patient arrived in clinic today with the expectation that we be able to put an Apligraf on this area after debridement however she arrives with a relatively large amount of green drainage on the dressing. The patient states that this started on Friday. She has  not been systemically unwell. 10/19; culture I did last week showed both Enterococcus and Pseudomonas. I think this came in separate parts because I stopped her ciprofloxacin I gave her and prescribed her linezolid however now looking at the final culture result this is Pseudomonas which is resistant to quinolones. She has not yet picked up the linezolid apparently phone issues. We are also trying to get a topical antibiotic out of [facility] in [place] they can be applied by home health. She is still having green drainage 10/16; the patient has her topical antibiotic from [facility] in [place]. This is a compounded gel with vancomycin and ciprofloxacin and gentamicin. We are applying this on the wound bed with silver alginate over the top with Unna boot wraps. She arrives in clinic today with a lot less ominous looking drainage although she is only use this topical preparation once the second time today. She sees Dr. [name] her surgeon on Friday she has home health changing the dressing 11/2; still using her compounded topical antibiotic under silver  alginate. Surface is cleaning up there is less drainage. We had an Apligraf for her today and I elected to apply it. A light coating of her antibiotic 04/25/2020 upon evaluation today patient appears to be doing well with regard to her ankle ulcer. There is a little bit of slough noted on the surface of the wound I am can have to perform sharp debridement to clear this away today. With that being said other than that fact overall I feel like she is making progress and we do see some new epithelial growth. There is also some improvement in the depth of the wound and that distal portion. There is little bit of slough there as well. 12/7; 2-week follow-up. Apligraf #3. Dimensions are smaller. Closing in especially inferiorly. Still some surface debris. Still using the [facility] topical antibiotic but I told her that I don't think this needs to be renewed 12/21; 2-week follow-up. Apligraf #4 dimensions are smaller. Nice improvement 06/05/2020; 2-week follow-up. The patient's wound on the left medial ankle looks really excellent. Nice granulation. Advancing epithelialization no undermining no evidence of infection. We would have to reapply for another Apligraf but with the condition of this wound I did not feel strongly about it. We used Hydrofera Blue under the same degree of compression. She follows up with Dr. [name] her surgeon a week Friday 06/13/2020 upon evaluation today patient appears to be doing excellent in regard to her wound. She has been tolerating the dressing changes without complication. Fortunately there is no signs of active infection at this time. No fevers, chills, nausea, vomiting, or diarrhea. She was using Hydrofera Blue last week. 06/20/2020 06/20/2020 on evaluation today patient actually appears to be doing excellent in regard to her wound. This is measuring better and looking much better as well. She has been using the collagen that seems to be doing better for her as well even though  the [facility] was and is not sticking or feeling as rough on her wound. She did see Dr. [name] on Friday he is very pleased he also stated none of the hardware has shifted. That is great news 1/27; the patient has a small clean wound all that is remaining. I agree that this is too small to really consider an Apligraf. Under illumination the surface is looking quite good. We have been using collagen although the dimensions are not any better this week 2/2; the patient has a small clean wound on  the left medial ankle. Although this left of her substantial original areas. Measurements are smaller. We have been using polymen Ag under an Haematologist. 2/10; small area on the left medial ankle. This looks clean nothing to debride however dimensions are about the same we have been using polymen I think now for 2 weeks 2/17; not much change in surface area. We have been using polymen Ag without any improvement. 3/17; 1 month follow-up. The patient has been using endoform without any improvement in fact I think this looks worse with more depth and more expansion 3/24; no improvement. Perhaps less debris on the surface. We have been using Sorbact for 1 week 4/4; wound measures larger. She has edema in her leg and her foot which she tells as her wrap came down. We have been using Unna boots. Sorbact of the wound. She has been approved for Apligraf 09/12/2020 upon evaluation today patient appears to be doing well with regard to her wound. We did get the Apligraf reapproved which is great news we have that available for application today. Fortunately there is no signs of infection and overall the patient seems to be doing great. The wound bed is nice and clean. 4/27; patient presents for her second application of Apligraf. She states over the past week she has been on her feet more often due to being outside in her garden. She has noted more swelling to her foot as a result. She denies increased warmth, pain or  erythema to the wound site. 10/10/2020 upon evaluation today patient appears to be doing well with regard to her wound which does not appear to be quite as irritated as last week from what I am hearing. With that being said unfortunately she is having issues with some erythema and warmth to touch as well as an increase size. I do believe this likely is infected. 10/17/2020 upon evaluation today patient appears to be doing excellent in regard to her wound this is significantly improved as compared to last week. Fortunately I think that the infection is much better controlled at this point. She did have evidence of both Enterococcus as well as Staphylococcus noted on culture. Enterococcus really would not be helped significantly by the Cipro but the wound is doing so much better I am under the assumption that the Staphylococcus is probably the main organism that is causing the current infection. Nonetheless I think that she is doing excellent as far as that is concerned and I am very pleased in that regard. I would therefore recommend she continue with the Cipro. 10/31/2020 upon evaluation today patient appears to be doing well with regard to her wound. She has been tolerating the dressing changes without complication. Fortunately there is no signs of active infection and overall I am extremely pleased with where things stand today. No fevers, chills, nausea, vomiting, or diarrhea. With that being said she does have some green drainage coming from the wound and although it looks okay I am a little concerned about the possibility of a continuing infection. Specifically with Pseudomonas. For that reason I will go ahead and send in a prescription for Cipro for her to be continued. 11/14/2020 upon evaluation today patient appears to be doing very well currently in regard to her wound on her leg. She has been tolerating the dressing changes without complication. Fortunately I feel like the infection is finally  under good control here. Unfortunately we do not have the Apligraf for application today although we can definitely order  to have it in place for next week. That will be her fifth and final of the current series. Nonetheless I feel like her wound is really doing quite well which is great news. 11/21/2020 upon evaluation today patient appears to be doing well with regard to her wound on the medial ankle. Fortunately I think the infection is under control and I do believe we can go ahead and reapply the Apligraf today. She is in agreement with that plan. There does not appear to be any signs of active infection at this time which is great news. No fevers, chills, nausea, vomiting, or diarrhea. 12/05/2020 upon evaluation today patient's wound bed actually showed signs of good granulation epithelization at this point. There does not appear to be any signs of infection which is great news and overall very pleased with where things stand. Overall the patient seems to be doing fairly well in my opinion with regard to her wound although I do believe she continues to build up a lot of biofilm I think she could benefit from using PuraPly at this point. 12/12/2020 upon evaluation today patient's wound actually appears to be doing decently well today. The Unna boot has not been quite as well-tolerated so that more uncomfortable for her and even causing some pressure over the plate on the lateral portion of her foot which is 90 where the wound is. There did not appear to be any significant deep tissue injury with that there may be a minimal change in the skin noted I think that we may want to go back to the Coflex 2 layer which is a little bit easier on her skin it seems. 12/19/2020 upon evaluation today patient actually seems to be making great progress with the PuraPly currently. She in fact seems to be much better as far as the overall appearance of the wound bed is concerned I am very happy in this regard. I do not  see any signs of of infection which is great news as well. No fevers, chills, nausea, vomiting, or diarrhea. 12/26/2020 upon evaluation today patient appears to actually be doing better in regard to her wound on the left medial ankle region. The surface of the wound is actually doing significantly better which is great news. There does not appear to be any signs of infection which is also great news and in general I am extremely pleased with where we stand today. 01/02/2021 upon evaluation today patient appears to be doing well with regard to her wound. In fact this is showing signs of excellent improvement and very pleased with where things stand. In fact the last 3 appointments have all shown signs of this getting smaller which is excellent news. I have not even had to perform any debridement and today is no exception. Overall I feel like this is dramatically improved compared to previous. T oday is PuraPly application #4. 08/23/4008 upon evaluation today patient appears to be doing excellent in regard to her wound this is continue to show signs of improvement and overall I am extremely pleased with where we stand today. She is actually here for PuraPly application #5. Every time we have applied this she is noted definite improvement on measurements. 01/23/2021 upon evaluation today patient is actually making good progress in regard to her wound. This was actually on just a little bit longer this time compared to previous due to the fact that she did have to go out of town. She is actually here for PuraPly application #6. We  have definitely been seeing improvements in the overall quality of the tissue on the surface of the wound which is awesome news. In general I think that the patient seems to be continuing to make great progress here. 01/30/2021 upon evaluation today patient's wound is actually doing excellent. There is really not any significant biofilm buildup which is great news and overall I am  extremely pleased with where things stand today. There does not appear to be any signs of active infection. No fevers, chills, nausea, vomiting, or diarrhea. 02/06/2021 upon evaluation today patient's wound is actually showing signs of excellent improvement which is great news. We continue to see the benefit of the PuraPly this is doing a great job the wound seems not really irritated whatsoever and is showing signs of good granulation at this point. Overall I am extremely happy with what we are seeing. The patient likewise is happy to hear all of this as well. 02/13/2021 upon evaluation today patient appears to be doing well with regard to her wound. This again is measuring smaller today and I am very pleased in this regard. Fortunately there does not appear to be any signs of active infection at this time which is good news from a systemic standpoint. Locally there is still some significant drainage which she does have is concerned about infection locally. No fevers, chills, nausea, vomiting, or diarrhea. 02/20/2021 upon evaluation today patient actually appears to be doing decently well in regard to her wound. She has been tolerating the dressing changes without complication. I do believe the PuraPly is helping wound bed does appear to be doing better. There is no evidence of active infection locally or systemically at this point visually although on fluorescence imaging there still appear to be bacterial load and bioburden noted. 02/27/2021 upon evaluation today patient fortunately continues to show signs of improvement with use of the PuraPly currently. Subsequently we did review her culture results and to be honest I think that the Bactrim is probably the best option to have her continue at this point. For that reason I am get a go ahead and send in a refill today for her for an additional 2 weeks. Nonetheless I think that we are making excellent progress here. It was Enterococcus and Staphylococcus  that were noted she is allergic to penicillin so there is not much I can do from the Enterococcus standpoint though the staff does seem to be sensitive to the Bactrim. 03/06/2021 upon evaluation today patient appears to be doing well with regard to her wound. This is definitely showing signs of improvement which is great news and overall I am extremely pleased with where things stand today. There does not appear to be any signs of active infection which is great news and overall and I do believe that we are headed in the appropriate direction I think the PuraPly is doing an awesome job for her. 03/20/2021 upon evaluation today patient actually appears to be doing well with regard to her wound. We have been using the PuraPly although last week when I was out of the office they actually just used endoform nonetheless this still seems to be doing great. Fortunately there does not appear to be any signs of active infection which is great news overall I think the patient is making wonderful progress. 03/27/2021 upon evaluation today patient appears to be doing well with regard to her wound in fact there is a little drainage but otherwise her does not appear to be any major signs  of open wounds nor infection at this time. Overall I am extremely pleased with where things stand. No fevers, chills, nausea, vomiting, or diarrhea. Objective Constitutional Well-nourished and well-hydrated in no acute distress. Vitals Time Taken: 10:15 AM, Height: 68 in, Temperature: 98 F, Pulse: 59 bpm, Respiratory Rate: 20 breaths/min, Blood Pressure: 130/66 mmHg. Respiratory normal breathing without difficulty. Psychiatric this patient is able to make decisions and demonstrates good insight into disease process. Alert and Oriented x 3. pleasant and cooperative. General Notes: Upon inspection patient's wound bed showed signs of good granulation epithelization at this point. I did clear away some of the drainage and  this appears to be doing quite well there may be a tiny pinpoint opening center but overall this seems to be healing quite nicely. I think just a small alginate dressing may be the best way to go for this week. Integumentary (Hair, Skin) Wound #15 status is Open. Original cause of wound was Gradually Appeared. The date acquired was: 12/30/2019. The wound has been in treatment 62 weeks. The wound is located on the Left,Medial Malleolus. The wound measures 0.2cm length x 0.2cm width x 0.1cm depth; 0.031cm^2 area and 0.003cm^3 volume. There is Fat Layer (Subcutaneous Tissue) exposed. There is no tunneling or undermining noted. There is a small amount of serosanguineous drainage noted. The wound margin is distinct with the outline attached to the wound base. There is no granulation within the wound bed. There is a large (67-100%) amount of necrotic tissue within the wound bed. General Notes: scabbed over. 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 There was a Selective/Open Wound Non-Viable Tissue Debridement with a total area of 1.26 sq cm performed by [name], PA. With the following instrument(s): Curette to remove Non-Viable tissue/material. Material removed includes Callus and Fibrin/Exudate and after achieving pain control using Lidocaine 4% T opical Solution. No specimens were taken. A time out was conducted at 10:40, prior to the start of the procedure. There was no bleeding. The procedure was tolerated well with a pain level of 1 throughout and a pain level of 0 following  the procedure. Post Debridement Measurements: 0.2cm length x 0.2cm width x 0.1cm depth; 0.003cm^3 volume. Character of Wound/Ulcer Post Debridement is improved. Severity of Tissue Post Debridement is: Fat layer exposed. Post procedure Diagnosis Wound #15: Same as Pre-Procedure Pre-procedure diagnosis of Wound #15 is a Venous Leg Ulcer located on the Left,Medial Malleolus . There was a Double Layer Compression Therapy Procedure by Baruch Gouty, RN. Post procedure Diagnosis Wound #15: Same as Pre-Procedure Plan Follow-up Appointments: Return Appointment in 1 week. Margarita Grizzle Bathing/ Shower/ Hygiene: May shower with protection but do not get wound dressing(s) wet. Edema Control - Lymphedema / SCD / Other: Elevate legs to the level of the heart or above for 30 minutes daily and/or when sitting, a frequency of: - throughout the day Avoid standing for long periods of time. Exercise regularly Compression stocking or Garment 20-30 mm/Hg pressure to: - right leg daily Additional Orders / Instructions: Follow Nutritious Diet WOUND #15: - Malleolus Wound Laterality: Left, Medial Cleanser: Soap and Water 1 x Per Week/30 Days Discharge Instructions: May shower  and wash wound with dial antibacterial soap and water prior to dressing change. Peri-Wound Care: Triamcinolone 15 (g) 1 x Per Week/30 Days Discharge Instructions: Use triamcinolone 15 (g) as directed Peri-Wound Care: Sween Lotion (Moisturizing lotion) 1 x Per Week/30 Days Discharge Instructions: Apply moisturizing lotion as directed Prim Dressing: KerraCel Ag Gelling Fiber Dressing, 2x2 in (silver alginate) 1 x Per Week/30 Days ary Discharge Instructions: Apply silver alginate to wound bed as instructed Secondary Dressing: Woven Gauze Sponge, Non-Sterile 4x4 in 1 x Per Week/30 Days Discharge Instructions: to bolster skin sub Secondary Dressing: ABD Pad, 5x9 1 x Per Week/30 Days Discharge Instructions: Apply over primary dressing and lateral  ankle Secondary Dressing: Optifoam Non-Adhesive Dressing, 4x4 in 1 x Per Week/30 Days Discharge Instructions: Apply over primary dressing pad lateral foot with foam donut Compression Wrap: CoFlex TLC XL 2-layer Compression System 4x7 (in/yd) 1 x Per Week/30 Days Discharge Instructions: Apply CoFlex 2-layer compression as directed. (alt for 4 layer) 1. Would recommend currently that we initiate treatment with a plain alginate dressing and we will see how things go over the next week. The patient is in agreement with that plan. 2. I am also can recommend that we have the patient continue with the compression wrap. We have been doing this for some time and it does seem to help significantly with her swelling and edema overall I think she is doing quite well. We will see patient back for reevaluation in 1 week here in the clinic. If anything worsens or changes patient will contact our office for additional recommendations. Electronic Signature(s) Signed: 03/27/2021 11:06:57 AM By: [name] PA-C Entered By: [name] on 03/27/2021 11:06:56 -------------------------------------------------------------------------------- SuperBill Details Patient Name: Date of Service: [facility] [name], [name] 03/27/2021 Medical Record Number: [id number] Patient Account Number: [id number] Date of Birth/Sex: Treating RN: Aug 16, 1955 (65 y.o. [name] Provider: [name] Other Clinician: Referring Provider: Treating Provider/Extender: [name] in Treatment: 14 Diagnosis Coding ICD-10 Codes Code Description [phone] Chronic venous hypertension (idiopathic) with ulcer and inflammation of left lower extremity L97.828 Non-pressure chronic ulcer of other part of left lower leg with other specified severity L97.328 Non-pressure chronic ulcer of left ankle with other specified severity Facility Procedures CPT4 Code: [id number] Description: [phone] - [facility]BRIDE  WOUND 1ST 20 SQ CM OR < ICD-10 Diagnosis Description L97.828 Non-pressure chronic ulcer of other part of left lower leg with other specified se Modifier: verity Quantity: 1 Physician Procedures : CPT4 Code Description Modifier [id number] - WC PHYS DEBR WO ANESTH 20 SQ CM ICD-10 Diagnosis Description L97.828 Non-pressure chronic ulcer of other part of left lower leg with other specified severity Quantity: 1 Electronic Signature(s) Signed: 03/27/2021 11:08:18 AM By: [name] PA-C Entered By: [name] on 03/27/2021 11:08:18

## 2021-03-27 NOTE — Progress Notes (Signed)
[name], [name] ([id number]) Visit Report for 03/27/2021 Arrival Information Details Patient Name: Date of Service: [facility], [name] 03/27/2021 10:00 A M Medical Record Number: [id number] Patient Account Number: [id number] Date of Birth/Sex: Treating RN: [birth year] (65 y.o. [name], Meta.Reding Primary Care Nakiah Osgood: [name] Other Clinician: Referring Zimir Kittleson: Treating Riordan Walle/Extender: [name] in Treatment: 18 Visit Information History Since Last Visit Added or deleted any medications: No Patient Arrived: Cane Any new allergies or adverse reactions: No Arrival Time: 10:15 Had a fall or experienced change in No Accompanied By: self activities of daily living that may affect Transfer Assistance: None risk of falls: Patient Identification Verified: Yes Signs or symptoms of abuse/neglect since last visito No Secondary Verification Process Completed: Yes Hospitalized since last visit: No Patient Requires Transmission-Based Precautions: No Implantable device outside of the clinic excluding No Patient Has Alerts: Yes cellular tissue based products placed in the center Patient Alerts: L ABI =1.12, TBI = .92 since last visit: R ABI= 1.02, TBI= .58 Has Dressing in Place as Prescribed: Yes Has Compression in Place as Prescribed: Yes Pain Present Now: Yes Electronic Signature(s) Signed: 03/27/2021 5:43:04 PM By: Deon Pilling RN, BSN Entered By: Deon Pilling on 03/27/2021 10:19:06 -------------------------------------------------------------------------------- Compression Therapy Details Patient Name: Date of Service: [name], [name] 03/27/2021 10:00 A M Medical Record Number: [id number] Patient Account Number: [id number] Date of Birth/Sex: Treating RN: 28-Jan-1956 (65 y.o. [name] Primary Care Kaleth Koy: [name] Other Clinician: Referring Markeita Alicia: Treating Sidrah Harden/Extender: [name] in  Treatment: 70 Compression Therapy Performed for Wound Assessment: Wound #15 Left,Medial Malleolus Performed By: Clinician Baruch Gouty, RN Compression Type: Double Layer Post Procedure Diagnosis Same as Pre-procedure Electronic Signature(s) Signed: 03/27/2021 5:09:33 PM By: Baruch Gouty RN, BSN Entered By: Baruch Gouty on 03/27/2021 10:41:54 -------------------------------------------------------------------------------- Encounter Discharge Information Details Patient Name: Date of Service: [name], [name] 03/27/2021 10:00 A M Medical Record Number: [id number] Patient Account Number: [id number] Date of Birth/Sex: Treating RN: [birth year] (65 y.o. [name] Primary Care Viviene Thurston: [name] Other Clinician: Referring Margee Trentham: Treating Shadaya Marschner/Extender: [name] in Treatment: 27 Encounter Discharge Information Items Post Procedure Vitals Discharge Condition: Stable Temperature (F): 98 Ambulatory Status: Cane Pulse (bpm): 59 Discharge Destination: Home Respiratory Rate (breaths/min): 18 Transportation: Private Auto Blood Pressure (mmHg): 130/66 Accompanied By: self Schedule Follow-up Appointment: Yes Clinical Summary of Care: Patient Declined Electronic Signature(s) Signed: 03/27/2021 5:09:33 PM By: Baruch Gouty RN, BSN Entered By: Baruch Gouty on 03/27/2021 10:56:50 -------------------------------------------------------------------------------- Lower Extremity Assessment Details Patient Name: Date of Service: [name] [name], [name] 03/27/2021 10:00 A M Medical Record Number: [id number] Patient Account Number: [id number] Date of Birth/Sex: Treating RN: Aug 16, 1955 (65 y.o. [name] Primary Care Perris Tripathi: [name] Other Clinician: Referring Dalisa Forrer: Treating Gracie Gupta/Extender: [name] in Treatment: 34 Edema Assessment Assessed: [Left: Yes] [Right: No] Edema: [Left: Ye]  [Right: s] Calf Left: Right: Point of Measurement: 31 cm From Medial Instep 25 cm Ankle Left: Right: Point of Measurement: 11 cm From Medial Instep 21 cm Vascular Assessment Pulses: Dorsalis Pedis Palpable: [Left:Yes] Electronic Signature(s) Signed: 03/27/2021 5:43:04 PM By: Deon Pilling RN, BSN Entered By: Deon Pilling on 03/27/2021 10:21:57 -------------------------------------------------------------------------------- Imperial Details Patient Name: Date of Service: [name], [name] 03/27/2021 10:00 A M Medical Record Number: [id number] Patient Account Number: [id number] Date of Birth/Sex: Treating RN: 06/04/1955 (65 y.o. F)  Baruch Gouty Primary Care [name]: [name] Other Clinician: Referring Cole Eastridge: Treating Macoy Rodwell/Extender: [name] in Treatment: 65 Multidisciplinary Care Plan reviewed with physician Active Inactive Venous Leg Ulcer Nursing Diagnoses: Actual venous Insuffiency (use after diagnosis is confirmed) Knowledge deficit related to disease process and management Goals: Patient will maintain optimal edema control Date Initiated: 06/20/2020 Target Resolution Date: 04/24/2021 Goal Status: Active Interventions: Assess peripheral edema status every visit. Compression as ordered Treatment Activities: Therapeutic compression applied : 06/20/2020 Notes: 01/30/21: Edema control ongoing, using CoFlex wrap. Wound/Skin Impairment Nursing Diagnoses: Impaired tissue integrity Knowledge deficit related to ulceration/compromised skin integrity Goals: Patient/caregiver will verbalize understanding of skin care regimen Date Initiated: 01/17/2020 Target Resolution Date: 04/24/2021 Goal Status: Active Ulcer/skin breakdown will have a volume reduction of 30% by week 4 Date Initiated: 01/17/2020 Date Inactivated: 03/12/2020 Target Resolution Date: 03/09/2020 Goal Status: Met Interventions: Assess  patient/caregiver ability to obtain necessary supplies Assess patient/caregiver ability to perform ulcer/skin care regimen upon admission and as needed Assess ulceration(s) every visit Provide education on ulcer and skin care Notes: 01/30/21: Wound care regimen continues, PuraPly skin sub being used. Electronic Signature(s) Signed: 03/27/2021 5:09:33 PM By: Baruch Gouty RN, BSN Entered By: Baruch Gouty on 03/27/2021 10:38:07 -------------------------------------------------------------------------------- Pain Assessment Details Patient Name: Date of Service: [name], [name] 03/27/2021 10:00 A M Medical Record Number: [id number] Patient Account Number: [id number] Date of Birth/Sex: Treating RN: February 13, 1956 (65 y.o. [name] Primary Care Ryver Poblete: [name] Other Clinician: Referring Lexiana Spindel: Treating Bruk Tumolo/Extender: [name] in Treatment: 84 Active Problems Location of Pain Severity and Description of Pain Patient Has Paino Yes Site Locations Pain Location: Pain in Ulcers Rate the pain. Current Pain Level: 5 Worst Pain Level: 10 Least Pain Level: 0 Tolerable Pain Level: 8 Pain Management and Medication Current Pain Management: Medication: No Cold Application: No Rest: No Massage: No Activity: No T.E.N.S.: No Heat Application: No Leg drop or elevation: No Is the Current Pain Management Adequate: Adequate How does your wound impact your activities of daily livingo Sleep: No Bathing: No Appetite: No Relationship With Others: No Bladder Continence: No Emotions: No Bowel Continence: No Work: No Toileting: No Drive: No Dressing: No Hobbies: No Engineer, maintenance) Signed: 03/27/2021 5:43:04 PM By: Deon Pilling RN, BSN Entered By: Deon Pilling on 03/27/2021 10:22:21 -------------------------------------------------------------------------------- Patient/Caregiver Education Details Patient Name: Date of  Service: [name], [name] 10/26/2022andnbsp10:00 A M Medical Record Number: [id number] Patient Account Number: [id number] Date of Birth/Gender: Treating RN: [birth year] (65 y.o. [name] Primary Care Physician: [name] Other Clinician: Referring Physician: Treating Physician/Extender: [name] in Treatment: 8 Education Assessment Education Provided To: Patient Education Topics Provided Venous: Methods: Explain/Verbal Responses: Reinforcements needed, State content correctly Electronic Signature(s) Signed: 03/27/2021 5:09:33 PM By: Baruch Gouty RN, BSN Entered By: Baruch Gouty on 03/27/2021 10:38:52 -------------------------------------------------------------------------------- Wound Assessment Details Patient Name: Date of Service: [name], [name] 03/27/2021 10:00 A M Medical Record Number: [id number] Patient Account Number: [id number] Date of Birth/Sex: Treating RN: 1956/04/08 (65 y.o. [name] Primary Care Jaaziel Peatross: [name] Other Clinician: Referring Carter Kaman: Treating Ramesha Poster/Extender: [name] Weeks in Treatment: 65 Wound Status Wound Number: 15 Primary Venous Leg Ulcer Etiology: Wound Location: Left, Medial Malleolus Wound Status: Open Wounding Event: Gradually Appeared Comorbid Peripheral Venous Disease, Osteoarthritis, Confinement Date Acquired: 12/30/2019 History: Anxiety Weeks Of Treatment: 62 Clustered Wound: No Wound Measurements Length: (cm) 0.2 Width: (  cm) 0.2 Depth: (cm) 0.1 Area: (cm) 0.031 Volume: (cm) 0.003 % Reduction in Area: 99.7% % Reduction in Volume: 100% Epithelialization: Large (67-100%) Tunneling: No Undermining: No Wound Description Classification: Full Thickness With Exposed Support Structures Wound Margin: Distinct, outline attached Exudate Amount: Small Exudate Type: Serosanguineous Exudate Color: red, brown Foul Odor After  Cleansing: No Slough/Fibrino Yes Wound Bed Granulation Amount: None Present (0%) Exposed Structure Necrotic Amount: Large (67-100%) Fascia Exposed: No Fat Layer (Subcutaneous Tissue) Exposed: Yes Tendon Exposed: No Muscle Exposed: No Joint Exposed: No Bone Exposed: No Assessment Notes scabbed over. Treatment Notes Wound #15 (Malleolus) Wound Laterality: Left, Medial Cleanser Soap and Water Discharge Instruction: May shower and wash wound with dial antibacterial soap and water prior to dressing change. Peri-Wound Care Triamcinolone 15 (g) Discharge Instruction: Use triamcinolone 15 (g) as directed Sween Lotion (Moisturizing lotion) Discharge Instruction: Apply moisturizing lotion as directed Topical Primary Dressing KerraCel Ag Gelling Fiber Dressing, 2x2 in (silver alginate) Discharge Instruction: Apply silver alginate to wound bed as instructed Secondary Dressing Woven Gauze Sponge, Non-Sterile 4x4 in Discharge Instruction: to bolster skin sub ABD Pad, 5x9 Discharge Instruction: Apply over primary dressing and lateral ankle Optifoam Non-Adhesive Dressing, 4x4 in Discharge Instruction: Apply over primary dressing pad lateral foot with foam donut Secured With Compression Wrap CoFlex TLC XL 2-layer Compression System 4x7 (in/yd) Discharge Instruction: Apply CoFlex 2-layer compression as directed. (alt for 4 layer) Compression Stockings Add-Ons Electronic Signature(s) Signed: 03/27/2021 5:43:04 PM By: Deon Pilling RN, BSN Entered By: Deon Pilling on 03/27/2021 10:27:21 -------------------------------------------------------------------------------- Vitals Details Patient Name: Date of Service: [name], [name] 03/27/2021 10:00 A M Medical Record Number: [id number] Patient Account Number: [id number] Date of Birth/Sex: Treating RN: 01-23-1956 (65 y.o. [name], [name] Primary Care Jatia Musa: [name] Other Clinician: Referring Sueellen Kayes: Treating  Harlow Basley/Extender: [name] in Treatment: 63 Vital Signs Time Taken: 10:15 Temperature (F): 98 Height (in): 68 Pulse (bpm): 59 Respiratory Rate (breaths/min): 20 Blood Pressure (mmHg): 130/66 Reference Range: 80 - 120 mg / dl Electronic Signature(s) Signed: 03/27/2021 5:43:04 PM By: Deon Pilling RN, BSN Entered By: Deon Pilling on 03/27/2021 10:19:20

## 2021-04-03 ENCOUNTER — Encounter (HOSPITAL_BASED_OUTPATIENT_CLINIC_OR_DEPARTMENT_OTHER): Payer: Medicare Other | Attending: Physician Assistant | Admitting: Physician Assistant

## 2021-04-03 ENCOUNTER — Other Ambulatory Visit: Payer: Self-pay

## 2021-04-03 DIAGNOSIS — L97328 Non-pressure chronic ulcer of left ankle with other specified severity: Secondary | ICD-10-CM | POA: Diagnosis not present

## 2021-04-03 DIAGNOSIS — L97828 Non-pressure chronic ulcer of other part of left lower leg with other specified severity: Secondary | ICD-10-CM | POA: Diagnosis not present

## 2021-04-03 DIAGNOSIS — I10 Essential (primary) hypertension: Secondary | ICD-10-CM | POA: Diagnosis not present

## 2021-04-03 DIAGNOSIS — I87332 Chronic venous hypertension (idiopathic) with ulcer and inflammation of left lower extremity: Secondary | ICD-10-CM | POA: Insufficient documentation

## 2021-04-03 NOTE — Progress Notes (Addendum)
[name], [name] ([id number]) Visit Report for 04/03/2021 Chief Complaint Document Details Patient Name: Date of Service: [facility] [name], [name] 04/03/2021 10:00 A M Medical Record Number: [id number] Patient Account Number: [id number] Date of Birth/Sex: Treating RN: 06/24/1955 (65 y.o. [name] Provider: [name] Other Clinician: Referring Provider: Treating Provider/Extender: [name] in Treatment: 47 Information Obtained from: Patient Chief Complaint patient is been followed long-term in this clinic for venous insufficiency ulcers with inflammation, hypertension and ulceration over the medial ankle bilaterally. 01/17/2020; this is a patient who is here for review of postoperative wounds on the left lateral ankle and recurrence of venous stasis ulceration on the left medial Electronic Signature(s) Signed: 04/03/2021 10:13:24 AM By: [name] PA-C Entered By: [name] on 04/03/2021 10:13:23 -------------------------------------------------------------------------------- Debridement Details Patient Name: Date of Service: [name] [name], [name] 04/03/2021 10:00 [place] Record Number: [id number] Patient Account Number: [id number] Date of Birth/Sex: Treating RN: [birth year] (65 y.o. [name], [name] Primary Care Provider: [name] Other Clinician: Referring Provider: Treating Provider/Extender: [name] in Treatment: 63 Debridement Performed for Assessment: Wound #15 Left,Medial Malleolus Performed By: Physician [name], PA Debridement Type: Debridement Severity of Tissue Pre Debridement: Fat layer exposed Level of Consciousness (Pre-procedure): Awake and Alert Pre-procedure Verification/Time Out Yes - 11:12 Taken: Start Time: 11:13 Pain Control: Lidocaine 5% topical ointment T Area Debrided (L x W): otal 1 (cm) x 1 (cm) = 1 (cm) Tissue and other material debrided: Viable,  Non-Viable, Slough, Subcutaneous, Skin: Dermis , Skin: Epidermis, Slough Level: Skin/Subcutaneous Tissue Debridement Description: Excisional Instrument: Curette Bleeding: None Hemostasis Achieved: Pressure End Time: 11:15 Procedural Pain: 0 Post Procedural Pain: 0 Response to Treatment: Procedure was tolerated well Level of Consciousness (Post- Awake and Alert procedure): Post Debridement Measurements of Total Wound Length: (cm) 0.2 Width: (cm) 0.2 Depth: (cm) 0.1 Volume: (cm) 0.003 Character of Wound/Ulcer Post Debridement: Stable Severity of Tissue Post Debridement: Fat layer exposed Post Procedure Diagnosis Same as Pre-procedure Electronic Signature(s) Signed: 04/03/2021 6:26:15 PM By: [name] PA-C Signed: 04/04/2021 6:11:26 PM By: Deon Pilling RN, BSN Entered By: Deon Pilling on 04/03/2021 11:19:04 -------------------------------------------------------------------------------- HPI Details Patient Name: Date of Service: [name] [name], [name] 04/03/2021 10:00 A M Medical Record Number: [id number] Patient Account Number: [id number] Date of Birth/Sex: Treating RN: 12/01/1955 (65 y.o. [name] Provider: [name] Other Clinician: Referring Provider: Treating Provider/Extender: [name] in Treatment: 76 History of Present Illness HPI Description: the remaining wound is over the left medial ankle. Similar wound over the right medial ankle healed largely with use of Apligraf. Most recently we have been using Hydrofera Blue over this wound with considerable improvement. The patient has been extensively worked up in the past for her venous insufficiency and she is not a candidate for antireflux surgery although I have none of the details available currently. 08/24/14; considerable improvement today. About 50% of this wound areas now epithelialized. The base of the wound appears to be healthier granulation.as opposed to  last week when she had deteriorated a considerable improvement 08/17/14; unfortunately the wound has regressed somewhat. The areas of epithelialization from the superior aspect are not nearly as healthy as they were last week. The patient thinks her Hydrofera Blue slipped. 09/07/14; unfortunately the area has markedly regressed in the 2 weeks since I've seen this. There is an odor surrounding erythema. The  healthy granulation tissue that we had at the base of the wound now is a dusky color. The nurse reports green drainage 09/14/14; the area looks somewhat better than last week. There is less erythema and less drainage. The culture I did did not show any growth. Nevertheless I think it is better to continue the Cipro and doxycycline for a further week. The remaining wound area was debridement. 09/21/14. Wound did not require debridement last week. Still less erythema and less drainage. She can complete her antibiotics. The areas of epithelialization in the superior aspect of the wound do not look as healthy as they did some weeks ago 10/05/14 continued improvement in the condition of this wound. There is advancing epithelialization. Less aggressive debridement required 10/19/14 continued improvement in the condition and volume of this wound. Less aggressive debridement to the inferior part of this to remove surface slough and fibrinous eschar 11/02/14 no debridement is required. The surface granulation appears healthy although some of her islands of epithelialization seem to have regressed. No evidence of infection 11/16/14; lites surface debridement done of surface eschar. The wound does not look to be unhealthy. No evidence of infection. Unfortunately the patient has had podiatry issues in the right foot and for some reason has redeveloped small surface ulcerations in the medial right ankle. Her original presentation involved wounds in this area 11/23/14 no debridement. The area on the right ankle has  enlarged. The left ankle wound appears stable in terms of the surface although there is periwound inflammation. There has been regression in the amount of new skin 11/30/14 no debridement. Both wound areas appear healthy. There was no evidence of infection. The the new area on the right medial ankle has enlarged although that both the surfaces appear to be stable. 12/07/14; Debridement of the right medial ankle wound. No no debridement was done on the left. 12/14/14 no major change in and now bilateral medial ankle wounds. Both of these are very painful but the no overt evidence of infection. She has had previous venous ablation 12/21/14; patient states that her right medial ankle wound is considerably more painful last week than usual. Her left is also somewhat painful. She could not tolerate debridement. The right medial ankle wound has fibrinous surface eschar 12/28/14 this is a patient with severe bilateral venous insufficiency ulcers. For a considerable period of time we actually had the one on the right medial ankle healed however this recently opened up again in June. The left medial ankle wound has been a refractory area with some absent flows. We had some success with Hydrofera Blue on this area and it literally closed by 50% however it is recently opened up Foley. Both of these were debridement today of surface eschar. She tolerates this poorly 01/25/15: No change in the status of this. Thick adherent escar. Very poor tolerance of any attempt at debridement. I had healed the right medial malleolus wound for a considerable amount of time and had the left one down to about 50% of the volume although this is totally regressed over the last 48 weeks. Further the right leg has reopened. she is trying to make a appointment with pain and vascular, previous ablations with Dr. Aleda Grana. I do not believe there is an arterial insufficiency issue here 02/01/15 the status of the adherent eschar bilaterally  is actually improved. No debridement was done. She did not manage to get vascular studies done 02/08/15 continued debridement of the area was done today. The slough is less adherent  and comes off with less pressure. There is no surrounding infection peripheral pulses are intact 02/15/15 selective debridement with a disposable curette. Again the slough is less adherent and comes off with less difficulty. No surrounding infection peripheral pulses are intact. 02/22/15 selective debridement of the right medial ankle wound. Slough comes off with less difficulty. No obvious surrounding infection peripheral pulses are intact I did not debridement the one on the left. Both of these are stable to improved 03/01/15 selective debridement of both wound areas using a curette to. Adherent slough cup soft with less difficulty. No obvious surrounding infection. The patient tells me that 2 days ago she noted a rash above the right leg wrap. She did not have this on her lower legs when she change this over she arrives with widespread left greater than right almost folliculitis-looking rash which is extremely pruritic. I don't see anything to culture here. There is no rash on the rest of her body. She feels well systemically. 03/08/15; selective debridement of both wounds using a curette. Base of this does not look unhealthy. She had limegreen drainage coming out of the left leg wound and describes a lot of drainage. The rash on her left leg looks improved to. No cultures were done. 03/22/15; patient was not here last week. Basal wounds does not look healthy and there is no surrounding erythema. No drainage. There is still a rash on the left leg that almost looks vasculitic however it is clearly limited to the top of where the wrap would be. 04/05/15; on the right required a surgical debridement of surface eschar and necrotic subcutaneous tissue. I did not debridement the area on the left. These continue to be large open  wounds that are not changing that much. We were successful at one point in healing the area on the right, and at the same time the area on the left was roughly half the size of current measurements. I think a lot of the deterioration has to do with the prolonged time the patient is on her feet at work 04/19/15 I attempted-like surface debridement bilaterally she does not tolerate this. She tells me that she was in allergic care yesterday with extreme pain over her left lateral malleolus/ankle and was told that she has an "sprain" 05/03/15; large bilateral venous insufficiency wounds over the medial malleolus/medial aspect of her ankles. She complains of copious amounts of drainage and his usual large amounts of pain. There is some increasing erythema around the wound on the right extending into the medial aspect of her foot to. historically she came in with these wounds the right one healed and the left one came down to roughly half its current size however the right one is reopened and the left is expanded. 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 05/10/15 large bilateral venous insufficiency wounds. There is less adherence surface left however the surface culture that I did last week grew pseudomonas therefore bilateral selective debridement score necessary. There is surrounding erythema. The patient describes severe bilateral drainage and a lot of pain in the left ankle. Apparently her podiatrist was were ready to do a cortisone shot 05/17/15; the patient complains of pain and again copious amounts of drainage. 05/24/15; we used Iodo flex last week. Patient notes considerable improvement in wound drainage. Only needed to change this once. 05/31/15; we continued Iodoflex; the base of these large wounds bilaterally is not too bad  but there is probably likely a significant bioburden here. I would like to debridement just doesn't tolerate it. 06/06/14 I  would like to continue the Iodoflex although she still hasn't managed to obtain supplies. She has bilateral medial malleoli or large wounds which are mostly superficial. Both of them are covered circumferentially with some nonviable fibrinous slough although she tolerates debridement very poorly. She apparently has an appointment for an ablation on the right leg by interventional radiology. 06/14/15; the patient arrives with the wounds and static condition. We attempted a debridement although she does not do well with this secondary to pain. I 07/05/15; wounds are not much smaller however there appears to be a cleaner granulating base. The left has tight fibrinous slough greater than the right. Debridement is tolerated poorly due to pain. Iodoflex is done more for these wounds in any of the multitude of different dressings I have tried on the left 1 and then subsequently the right. 07/12/15; no change in the condition of this wound. I am able to do an aggressive debridement on the right but not the left. She simply cannot tolerate it. We have been using Iodoflex which helps somewhat. It is worthwhile remembering that at one point we healed the right medial ankle wound and the left was about 25% of the current circumference. We have suggested returning to vascular surgery for review of possible further ablations for one reason or another she has not been able to do this. 07/26/15 no major change in the condition of either wound on her medial ankle. I did not attempt to debridement of these. She has been aggressively scrubbing these while she is in the shower at home. She has her supply of Iodoflex which seems to have done more for these wounds then anything I have put on recently. 08/09/15 wound area appears larger although not verified by measurements. Using Iodoflex 09/05/2015 -- she was here for avisit today but had significant problems with the wound and I was asked to see her for a physician opinion. I  have summarize that this lady has had surgery on her left lower extremity about 10 years ago where the possible veins stripping was done. She has had an opinion from interventional radiology around November 2016 where no further sclerotherapy was ordered. The patient works 12 hours a day and stands on a concrete floor with work boots and is unable to get the proper compression she requires and cannot elevate her limbs appropriately at any given time. She has recently grown Pseudomonas from her wound culture but has not started her ciprofloxacin which was called in for her. 09/13/15 this continues to be a difficult situation for this patient. At one point I had this wound down to a 1.5 x 1.5" wound on her left leg. This is deteriorated and the right leg has reopened. She now has substantial wounds on her medial calcaneus, malleoli and into her lower leg. One on the left has surface eschar but these are far too painful for me to debridement here. She has a vascular surgery appointment next week to see if anything can be done to help here. I think she has had previous ablations several years ago at [facility]. She has no major edema. She tells me that she did not get product last time [facility] Ag] and went for several days without it. She continues to work in work boots 12 hours a day. She cannot get compression/4-layer under her work boots. 09/20/15 no major change. Periwound edema  control was not very good. Her point with pain and vascular is next Wednesday the 25th 09/28/15; the patient is seen vascular surgery and is apparently scheduled for repeat duplex ultrasounds of her bilateral lower legs next week. 10/05/15; the patient was seen by Dr. Doren Custard of vascular surgery. He feels that she should have arterial insufficiency excluded as cause/contributed to her nonhealing stage she is therefore booked for an arteriogram. She has apparently monophasic signals in the dorsalis pedis pulses. She also of course  has known severe chronic venous insufficiency with previous procedures as noted previously. I had another long discussion with the patient today about her continuing to work 12 hour shifts. I've written her out for 2 months area had concerns about this as her work location is currently undergoing significant turmoil and this may lead to her termination. She is aware of this however I agree with her that she simply cannot continue to stand for 12 hours multiple days a week with the substantial wound areas she has. 10/19/15; the Dr. Doren Custard appointment was largely for an arteriogram which was normal. She does not have an arterial issue. He didn't make a comment about her chronic venous insufficiency for which she has had previous ablations. Presumably it was not felt that anything additional could be done. The patient is now out of work as I prescribed 2 weeks ago. Her wounds look somewhat less aggravated presumably because of this. I felt I would give debridement another try today 10/25/15; no major change in this patient's wounds. We are struggling to get her product that she can afford into her own home through her insurance. 11/01/15; no major change in the patient's wounds. I have been using silver alginate as the most affordable product. I spoke to Dr. Marla Roe last week with her requested take her to the OR for surgical debridement and placement of ACEL. Dr. Marla Roe told me that she would be willing to do this however [facility] will not cover this, fortunately the patient has [place] healthcare of some variant 11/08/15; no major change in the patient's wounds. She has been completely nonviable surface that this but is in too much pain with any attempted debridement are clinic. I have arranged for her to see Dr. Marla Roe ham of plastic surgery and this appointment is on Monday. I am hopeful that they will take her to the OR for debridement, possible ACEL ultimately possible skin  graft 11/22/15 no major change in the patient's wounds over her bilateral medial calcaneus medial malleolus into the lower legs. Surface on these does not look too bad however on the left there is surrounding erythema and tenderness. This may be cellulitis or could him sleepy tinea. 11/29/15; no major changes in the patient's wounds over her bilateral medial malleolus. There is no infection here and I don't think any additional antibiotics are necessary. There is now plan to move forward. She sees Dr. Marla Roe in a week's time for preparation for operative debridement and ACEL placement I believe on 7/12. She then has a follow-up appointment with Dr. Marla Roe on 7/21 12/28/15; the patient returns today having been taken to the [place] by Dr. Marla Roe 12/12/15 she underwent debridement, intraoperative cultures [which were negative]. She had placement of a wound VAC. Parent really ACEL was not available to be placed. The wound VAC foam apparently adhered to the wound since then she's been using silver alginate, Xeroform under Ace wraps. She still says there is a lot of drainage and a lot  of pain 01/31/16; this is a patient I see monthly. I had referred her to Dr. Marla Roe him of plastic surgery for large wounds on her bilateral medial ankles. She has been to the OR twice once in early July and once in early August. She tells me over the last 3 weeks she has been using the wound VAC with ACEL underneath it. On the right we've simply been using silver alginate. Under Kerlix Coban wraps. 02/28/16; this is a patient I'm currently seeing monthly. She is gone on to have a skin graft over her large venous insufficiency ulcer on the left medial ankle. This was done by Dr. Marla Roe him. The patient is a bit perturbed about why she didn't have one on her right medial ankle wound. She has been using silver alginate to this. 03/06/16; I received a phone call from her plastic surgery Dr. Marla Roe. She expressed some  concern about the viability of the skin graft she did on the left medial ankle wound. Asked me to place Endoform on this. She told me she is not planning to do a subsequent skin graft on the right as the left one did not take very well. I had placed Hydrofera Blue on the right 03/13/16; continue to have a reasonably healthy wound on the right medial ankle. Down to 3 mm in terms of size. There is epithelialization here. The area on the left medial ankle is her skin graft site. I suppose the last week this looks somewhat better. She has an open area inferiorly however in the center there appears to be some viable tissue. There is a lot of surface callus and eschar that will eventually need to come off however none of this looked to be infected. Patient states that the is able to keep the dressing on for several days which is an improvement. 03/20/16 no major change in the circumference of either wound however on the left side the patient was at Dr. Eusebio Friendly office and they did a debridement of left wound. 50% of the wound seems to be epithelialized. I been using Endoform on the left Hydrofera Blue in the right 03/27/16; she arrives today with her wound is not looking as healthy as they did last week. The area on the right clearly has an adherent surface to this a very similar surface on the left. Unfortunately for this patient this is all too familiar problem. Clearly the Endoform is not working and will need to change that today that has some potential to help this surface. She does not tolerate debridement in this clinic very well. She is changing the dressing wants 04/03/16; patient arrives with the wounds looking somewhat better especially on the right. Dr. Migdalia Dk change the dressing to silver alginate when she saw her on Monday and also sold her some compression socks. The usefulness of the latter is really not clear and woman with severely draining wounds. 04/10/16; the patient is doing a bit of  an experiment wearing the compression stockings that Dr. Migdalia Dk provided her to her left leg and the out of legs based dressings that we provided to the right. 05/01/16; the patient is continuing to wear compression stockings Dr. Migdalia Dk provided her on the left that are apparently silver impregnated. She has been using Iodoflex to the right leg wound. Still a moderate amount of drainage, when she leaves here the wraps only last for 4 days. She has to change the stocking on the left leg every night 05/15/16; she is now using  compression stockings bilaterally provided by Dr. Marla Roe. She is wearing a nonadherent layer over the wounds so really I don't think there is anything specific being done to this now. She has some reduction on the left wound. The right is stable. I think all healing here is being done without a specific dressing 06/09/16; patient arrives here today with not much change in the wound certainly in diameter to large circular wounds over the medial aspect of her ankle bilaterally. Under the light of these services are certainly not viable for healing. There is no evidence of surrounding infection. She is wearing compression stockings with some sort of silver impregnation as prescribed by Dr. Marla Roe. She has a follow-up with her tomorrow. 06/30/16; no major change in the size or condition of her wounds. These are still probably covered with a nonviable surface. She is using only her purchase stockings. She did see Dr. Marla Roe who seemed to want to apply Dakin's solution to this I'm not extreme short what value this would be. I would suggest Iodoflex which she still has at home. 07/28/16; I follow Mrs. Wolz episodically along with Dr. Marla Roe. 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 She has been applying a topical collagen ointment to both wounds with Adaptic. I don't think Dr. Marla Roe is planning to take her  back to the OR. 08/19/16; I follow Mrs. Jeneen Rinks on a monthly basis along with Dr. Marla Roe of plastic surgery. 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 I been following her for a number of years. At one point I was able to get the right medial malleolus wound to heal and had the left medial malleolus down to about half its current size however and I had to send her to plastic surgery for an operative debridement. Since then things have been stable to slightly improve the area on the right is slightly better one in the left about the same although there is much less adherent surface than I'm used to with this patient. She is using some form of liquid collagen gel that Dr. Marla Roe provided a Kerlix cover with the patient's own pressure stockings. She tells me that she has extreme pain in both ankles and along the lateral aspect of both feet. She has been unable to work for some period of time. She is telling me she is retiring at the beginning of April. She sees Dr. Doran Durand of orthopedics next week 09/22/16; patient has not seen Dr. Marla Roe since the last time she is here. I'm not really sure what she is using to the wounds other than bits and pieces of think she had left over including most recently Hydrofera Blue. She is using juxtalite stockings. She is having difficulty with her husband's recent illness "stroke". She is having to transport him to various doctors appointments. Dr. Marla Roe left her the option of a repeat debridement with ACEL however she has not been able to get the time to follow-up on this. She continues to have a fair amount of drainage out of these wounds with certainly precludes leaving dressings on all week 10/13/16; patient has not seen Dr. Marla Roe since she was last in our clinic. I'm not really sure what she is doing with the wounds, we did try to get her [facility] and I think she is actually  using this most of the time. Because of drainage she states she has to  change this every second day although this is an improvement from what she used to do. She went to see Dr. Doran Durand who did not think she had a muscular issue with regards to her feet, he referred her to a neurologist and I think the appointment is sometime in June. I changed her back to Iodoflex which she has used in the past but not recently. 11/03/16; the patient has been using Iodoflex although she ran out of this. Still claims that there is a lot of drainage although the wound does not look like this. No surrounding erythema. She has not been back to see Dr. Marla Roe 11/24/16; the patient has been using Iodoflex again but she ran out of it 2 or 3 days ago. There is no major change in the condition of either one of these wounds in fact they are larger and covered in a thick adherent surface slough/nonviable tissue especially on the left. She does not tolerate mechanical debridement in our clinic. Going back to see Dr. Marla Roe of plastic surgery for an operative debridement would seem reasonable. 12/15/16; the patient has not been back to see Dr. Marla Roe. She is been dealing with a series of illnesses and her husband which of monopolized her time. She is been using Sorbact which we largely supplied. She states the drainage is bad enough that it maximum she can go 2-3 days without changing the dressing 01/12/2017 -- the patient has not been back for about 4 weeks and has not seen Dr. Marla Roe not does she have any appointment pending. 01/23/17; patient has not seen Dr. Marla Roe even though I suggested this previously. She is using Santyl that was suggested last week by Dr. Con Memos this Cost her $16 through her insurance which is indeed surprising 02/12/17; continuing Santyl and the patient is changing this daily. A lot of drainage. She has not been back to see plastic surgery she is using an Ace wrap. Our intake nurse suggested  wrap around stockings which would make a good reasonable alternative 02/26/17; patient is been using Santyl and changing this daily due to drainage. She has not been to see plastic surgery she uses in April Ace wrap to control the edema. She did obtain extremitease stockings but stated that the edema in her leg was to big for these 03/20/17; patient is using Santyl and Anasept. Surfaces looked better today the area on the right is actually measuring a little smaller. She has states she has a lot of pain in her feet and ankles and is asking for a consult to pain control which I'll try to help her with through our case manager. 04/10/17; the patient arrives with better-looking wound surfaces and is slightly smaller wound on the left she is using a combination of Santyl and Anasept. She has an appointment or at least as started in the pain control center associated with [facility] 05/14/17; this is a patient who I followed for a prolonged period of time. She has venous insufficiency ulcers on her bilateral medial ankles. At one point I had this down to a much smaller wound on the left however these reopened and we've never been able to get these to heal. She has been using Santyl and Anasept gel although 2 weeks ago she ran out of the Anasept gel. She has a stable appearance of the wound. She is going to the wound care clinic at [facility]. They wanted do a nerve block/spinal block although she tells me she is reluctant to go  forward with that. 05/21/17; this is a patient I have followed for many years. She has venous insufficiency ulcers on her bilateral medial ankles. Chronic pain and deformity in her ankles as well. She is been to see plastic surgery as well as orthopedics. Using PolyMem AG most recently/Kerramax/ABDs and 2 layer compression. She has managed to keep this on and she is coming in for a nurse check to change the dressing on Tuesdays, we see her on Fridays 06/05/17; really quite  a good looking surface and the area especially on the right medial has contracted in terms of dimensions. Well granulated healthy-looking tissue on both sides. Even with an open curet there is nothing that even feels abnormal here. This is as good as I've seen this in quite some time. We have been using PolyMem AG and bringing her in for a nurse check 06/12/17; really quite good surface on both of these wounds. The right medial has contracted a bit left is not. We've been using PolyMem and AG and she is coming in for a nurse visit 06/19/17; we have been using PolyMem AG and bringing her in for a nurse check. Dimensions of her wounds are not better but the surfaces looked better bilaterally. She complained of bleeding last night and the left wound and increasing pain bilaterally. She states her wound pain is more neuropathic than just the wounds. There was some suggestion that this was radicular from her pain management doctor in talking to her it is really difficult to sort this out. 06/26/17; using PolyMem and AG and bringing her in for a nurse check as All of this and reasonably stable condition. Certainly not improved. The dimensions on the lateral part of the right leg look better but not really measuring better. The medial aspect on the left is about the same. 07/03/16; we have been using PolyMen AG and bringing her in for a nurse check to change the dressings as the wounds have drainage which precludes once weekly changing. We are using all secondary absorptive dressings.our intake nurse is brought up the idea of using a wound VAC/snap VAC on the wound to help with the drainage to see if this would result in some contraction. This is not a bad idea. The area on the right medial is actually looking smaller. Both wounds have a reasonable-looking surface. There is no evidence of cellulitis. The edema is well controlled 07/10/17; the patient was denied for a snap VAC by her insurance. The major issue with  these wounds continues to be drainage. We are using wicked PolyMem AG and she is coming in for a nurse visit to change this. The wounds are stable to slightly improved. The surface looks vibrant and the area on the right certainly has shrunk in size but very slowly 07/17/17; the patient still has large wounds on her bilateral medial malleoli. Surface of both of these wounds looks better. The dimensions seem to come and go but no consistent improvement. There is no epithelialization. We do not have options for advanced treatment products due to insurance issues. They did not approve of the wound VAC to help control the drainage. More recently we've been using PolyMem and AG wicked to allow drainage through. We have been bringing her in for a nurse visit to change this. We do not have a lot of options for wound care products and the home again due to insurance issues 07/24/17; the patient's wound actually looks somewhat better today. No drainage measurements are smaller still  healthy-looking surface. We used silver collagen under PolyMen started last week. We have been bringing her in for a dressing change 07/31/17; patient's wound surface continued to look better and I think there is visible change in the dimensions of the wound on the right. Rims of epithelialization. We have been using silver collagen under PolyMen and bringing her in for a dressing change. There appears to be less drainage although she is still in need of the dressing change 08/07/17. Patient's wound surface continues to look better on both sides and the area on the right is definitely smaller. We have been using silver collagen and PolyMen. She feels that the drainage has been it has been better. I asked her about her vascular status. She went to see Dr. Aleda Grana at [facility] and had some form of ablation. I don't have much detail on this. I haven't my notes from 2016 that she was not a candidate for any further ablation but I  don't have any more information on this. We had referred her to vein and vascular I don't think she ever went. He does not have a history of PAD although I don't have any information on this either. We don't even have ABIs in our record 08/14/17; we've been using silver collagen and PolyMen cover. And putting the patient and compression. She we are bringing her in as a nurse visit to change this because ofarge amount of drainage. We didn't the ABIs in clinic today since they had been done in many moons 1.2 bilaterally. She has been to see vein and vascular however this was at [facility] and she had ablation although I really don't have any information on this all seemed biking get a report. She is also been operatively debrided by plastic surgery and had a cell placed probably 8-12 months ago. This didn't have a major effect. We've been making some gains with current dressings 08/19/17-She is here in follow-up evaluation for bilateral medial malleoli ulcers. She continues to tolerate debridement very poorly. We will continue with recently changed topical treatment; if no significant improvement may consider switching to Iodosorb/Iodoflex. 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 She has been using silver collagen and PolyMem. I believe she has been used and tried on Iodoflex before. 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 These have been very refractory over time. We have been using silver collagen and PolyMen. She is coming in once a week for a doctor's and once a week for nurse visits. We are actually making some progress 09/18/17; the patient's wounds are smaller especially on the right medial. She arrives today to upset to consider even washing these off  with Anasept which I think is been part of the reason this is been closing. We've been using collagen covered in PolyMen otherwise. It is noted that she has a small area of folliculitis on the right medial calf that. As we are wrapping her legs I'll give her a short course of doxycycline to make sure this doesn't amount to anything. She is a long list of complaints today including imbalance, shortness of breath on exertion, inversion of her left ankle. With regards to the latter complaints she is been to see orthopedics and they offered her a tendon release surgery I believe but  wanted her wounds to be closed first. I have recommended she go see her primary physician with regards to everything else. 09/25/17; patient's wounds are about the same size. We have made some progress bilaterally although not in recent weeks. She will not allow me T wash these wounds with Anasept even if she is doing her cell. Wheeze we've been using collagen covered in PolyMen. Last week she had a small area of folliculitis this is now opened into a small wound. She completed 5 days of trimethoprim sulfamethoxazole 10/02/17; unfortunately the area on her left medial ankle is worse with a larger wound area towards the Achilles. The patient complains of a lot of pain. She will not allow debridement although visually I don't think there is anything to debridement in any case. We have been using silver collagen and PolyMen for several months now. Initially we are making some progress although I'm not really seeing that today. We will move back to [facility]. His admittedly this is a bit of a repeat however I'm hoping that his situation is different now. The patient tells me she had her leg on the left give out on her yesterday this is process some pain. 10/09/17; the patient is seen twice a week largely because of drainage issues coming out of the chronic medial bimalleolar wounds that are chronic. Last week the dimensions of  the one on the left looks a little larger I changed her to [facility]. She comes in today with a history of terrible pain in the bilateral wound areas. She will not allow debridement. She will not even allow a tissue culture. There is no surrounding erythema no no evidence of cellulitis. We have been putting her Kerlix Coban man. She will not allow more aggressive compression as there was a suggestion to put her in 3 layer wraps. 10/16/17; large wounds on her bilateral medial malleoli. These are chronic. Not much change from last week. The surface looks have healthy but absolutely no epithelialization. A lot of pain little less so of drainage. She will not allow debridement or even washing these off in the vigorous fashion with Anasept. 10/23/17; large wounds on her bilateral malleoli which are chronic. Some improvement in terms of size perhaps on the right since last time I saw these. She states that after we increased the 3 layer compression there was some bleeding, when she came in for a nurse visit she did not want 3 layer compression put back on about our nurse managed to convince her. She has known chronic venous visit issues and I'm hoping to get her to tolerate the 3 layer compression. using Hydrofera Blue 10/30/17; absolutely no change in the condition of either wound although we've had some improvement in dimensions on the right.. Attempted to put her in 3 layer compression she didn't tolerated she is back in 2 layer compression. We've been using Hydrofera Blue We looked over her past records. She had venous reflux studies in November 2016. There was no evidence of deep venous reflux on the right. Superficial vein did not show the greater saphenous vein at think this is been previously ablated the small saphenous vein was within normal limits. The left deep venous system showed no DVT the vessels were positive for deep venous reflux in the posterior tibial veins at the ankle. The greater  saphenous vein was surgically absent small saphenous vein was within normal limits. She went to vein and vascular at [facility]. I believe she had an ablation  on the left greater saphenous vein. I'll update her reflux studies perhaps ever reviewed by vein and vascular. We've made absolutely no progress in these wounds. Will also try to read and TheraSkins through her insurance 11/06/17; W the patient apparently has a 2 week follow-up with vein and vascular I like him to review the whole issue with regards to her previous vascular workup by Dr. Aleda Grana. We've really made no progress on these wounds in many months. She arrives today with less viable looking surface on the left medial ankle wound. This was apparently looking about the same on Tuesday when she was here for nurse visit. 11/13/17; deep tissue culture I did last time of the left lower leg showed multiple organisms without any predominating. In particular no Staphylococcus or group A strep were isolated. We sent her for venous reflux studies. She's had a previous left greater saphenous vein stripping and I think sclerotherapy of the right greater saphenous vein. She didn't really look at the lesser saphenous vein this both wounds are on the medial aspect. She has reflux in the common femoral vein and popliteal vein and an accessory vein on the right and the common femoral vein and popliteal vein on the left. I'm going to have her go to see vein and vascular just the look over things and see if anything else beside aggressive compression is indicated here. We have not been able to make any progress on these wounds in spite of the fact that the surface of the wounds is never look too bad. 11/20/17; no major change in the condition of the wounds. Patient reports a large amount of drainage. She has a lot of complaints of pain although enlisting her today I wonder if some of this at least his neuropathic rather than secondary to her wounds.  She has an appointment with vein and vascular on 12/30/17. The refractory nature of these wounds in my mind at least need vein and vascular to look over the wounds the recent reflux studies we did and her history to see if anything further can be done here. I also note her gait is deteriorated quite a bit. 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 She has a bilateral Trendelenburg gait. I wonder if this is neuropathic or perhaps multilevel radicular. 11/27/17; her wounds actually looks slightly better. Healthy-looking granulation tissue a scant amount of epithelialization. [place] healthcare will not pay for [employer]. They will play for tri layer Oasis and Dermagraft. This is not a diabetic ulcer. We'll try for the tri layer Oasis. She still complains of some drainage. She has a vein and vascular appointment on 12/30/17 12/04/17; the wounds visually look quite good. Healthy-looking granulation with some degree of epithelialization. We are still waiting for response to our request for trial to try layer Oasis. Her appointment with vascular to review venous and arterial issues isn't sold the end of July 7/31. Not allow debridement or even vigorous cleansing of the wound surface. 12/18/17; slightly smaller especially on the right. 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 We've been using Hydrofera Blue. We still are looking into triple layer Oasis through her insurance 01/08/18 on evaluation today patient's wound actually appears to be showing signs of good improvement at this point in time. She has been tolerating the dressing changes without complication. Fortunately there does not appear to be any evidence of infection at this point in time. We have been utilizing silver nitrate which does seem to be of  benefit for her which is also good news. Overall I'm very happy with how things seem to be both regards appearance as well as measurement. Patient did see Dr. Bridgett Larsson  for evaluation on 12/30/17. In his assessment he felt that stripping would not likely add much more than chronic compression to the patient's healing process. His recommendation was to follow-up in three months with Dr. Doren Custard if she hasn't healed in order to consider referral back to you and see vascular where she previously was in a trial and was able to get her wound to heal. I'll be see what she feels she when you staying compression and he reiterated this as well. 01/13/18 on evaluation today patient appears to actually be doing very well in regard to her bilateral medial malleolus ulcers. She seems to have tolerated the chemical cauterization with silver nitrate last week she did have some pain through that evening but fortunately states that I'll be see since it seems to be doing better she is overall pleased with the progress. 01/21/18; really quite a remarkable improvement since I've last seen these wounds. We started using silver nitrate specially on the islands of hyper granulation which for some reason her around the wound circumference. This is really done quite nicely. Primary dressing Hydrofera Blue under 4 layer compression. She seems to be able to hold out without a nurse rewrap. Follow-up in 1 week 01/28/18; we've continued the hydrofera blue but continued with chemical cauterization to the wound area that we started about a month ago for irregular hyper granulation. She is made almost stunning improvement in the overall wound dimensions. I was not really expecting this degree of improvement in these chronic wounds 02/05/18; we continue with Hydrofera Bluebut of also continued the aggressive chemical cauterization with silver nitrate. We made nice progress with the right greater than left wound. 02/12/18. We continued with Hydrofera Blue after aggressive chemical cauterization with silver nitrate. We appear to be making nice progress with both wound areas 02/19/2018; we continue with  [facility] after washing the wounds vigorously with Anasept spray and chemical cauterization with silver nitrate. We are making excellent progress. The area on the right's just about closed 02/26/2018. The area on the left medial ankle had too much necrotic debris today. I used a #5 curette we are able to get most of the soft. I continued with the silver nitrate to the much smaller wound on the right medial ankle she had a new area on her right lower pretibial area which she says was due to a role in her compression 03/05/2018; both wound areas look healthy. Not much change in dimensions from last week. I continue to use silver nitrate and Hydrofera Blue. The patient saw Dr. Doren Custard of vein and vascular. He felt she had venous stasis ulcers. He felt based on her previous arteriogram she should have adequate circulation for healing. Also she has deep venous reflux but really no significant correctable superficial venous reflux at this time. He felt we should continue with conservative management including leg elevation and compression 04/02/2018; since we last saw this woman about a month ago she had a fall apparently suffered a pelvic fracture. I did not look up the x-ray. Nevertheless because of pain she literally was bedbound for 2 weeks and had home health coming out to change the dressing. Somewhat predictably this is resulted in considerable improvement in both wound areas. The right is just about closed on the medial malleolus and the left is about half  the size. 04/16/2018; both her wounds continue to go down in size. Using Hydrofera Blue. 05/07/18; both her wounds appeared to be improving especially on the right where it is almost closed. We are using Hydrofera Blue 05/14/2018; slightly worse this week with larger wounds. Surface on the left medial not quite as good. We have been using Hydrofera Blue 05/21/18; again the wounds are slightly larger. Left medial malleolus slightly larger with  eschar around the circumference. We have been using Hydrofera Blue undergoing a wraps for a prolonged period of time. This got a lot better when she was more recumbent due to a fall and a back injury. I change the primary dressing the silver alginate today. She did not tolerate a 4 layer compression previously although I may need to bring this up with her next time 05/28/2018; area on the left medial malleolus again is slightly larger with more drainage. Area on the right is roughly unchanged. She has a small area of folliculitis on the right medial just on the lower calf. This does not look ominous. 06/03/2018 left medial malleolus slightly smaller in a better looking surface. We used silver nitrate on this last time with silver alginate. The area on the right appears slightly smaller 1/10; left medial malleolus slightly smaller. Small open area on the right. We used silver nitrate and silver alginate as of 2 weeks ago. We continue with the wound and compression. These got a lot better when she was off her feet 1/17; right medial malleolus wound is smaller. The left may be slightly smaller. Both surfaces look somewhat better. 1/24; both wounds are slightly smaller. Using silver alginate under Unna boots 1/31; both wounds appear smaller in fact the area on the right medial is just about closed. Surface eschar. We have been using silver alginate under Unna boots. The patient is less active now spends let much less time on her feet and I think this is contributed to the general improvement in the wound condition 2/7; both wounds appear smaller. I was hopeful the right medial would be closed however there there is still the same small open area. Slight amount of surface eschar on the left the dimensions are smaller there is eschar but the wound edges appear to be free. We have been using silver alginate under Unna boot's 2/14; both wounds once again measure smaller. Circumferential eschar on the left  medial. We have been using silver alginate under Unna boots with gradual improvement 2/21; the area on the right medial malleolus has healed. The area on the left is smaller. We have been using silver alginate and Unna boots. We can discharge wrapping the right leg she has 20/30 stockings at home she will need to protect the scar tissue in this area 2/28; the area on the right medial malleolus remains closed the patient has a compression stocking. The area on the left is smaller. We have been using silver alginate and Unna boots. 3/6 the area on the right medial ankle remains closed. Good edema control noted she is using her own compression stocking. The area on the left medial ankle is smaller. We have been managing this with silver alginate and Unna boots which we will continue today. 3/13; the area on the right medial ankle remains closed and I'm declaring it healed today. When necessary the left is about the same still a healthy-looking surface but no major change and wound area. No evidence of infection and using silver alginate under unna and generally making considerable  improvement 3/27 the area on the right medial ankle remains closed the area on the left is about the same as last week. Certainly not any worse we have been using silver alginate under an Unna boot 4/3; the area on the right medial ankle remains closed per the patient. We did not look at this wound. The wound on the left medial ankle is about the same surface looks healthy we have been using silver alginate under an Unna boot 4/10; area on the right medial ankle remains closed per the patient. We did not look at this wound. The wound on the left medial ankle is slightly larger. The patient complains that the [facility] caused burning pain all week. She also told us that she was a lot more active this week. Changed her back to silver alginate 4/17; right medial ankle still closed per the patient. Left medial ankle is  slightly larger. Using silver alginate. She did not tolerate Hydrofera Blue on this area 4/24; right medial ankle remains closed we have not look at this. The left medial ankle continues to get larger today by about a centimeter. We have been using silver alginate under Unna boots. She complains about 4 layer compression as an alternative. She has been up on her feet working on her garden 5/8; right medial ankle remains closed we did not look at this. The left medial ankle has increased in size about 100%. We have been using silver alginate under Unna boots. She noted increased pain this week and was not surprised that the wound is deteriorated 5/15; no major change in SA however much less erythema ( one week of doxy ocellulitis). 5/22-65 year old female returns at 1 week to the clinic for left medial ankle wound for which we have been using silver alginate under 3 layer compression She was placed on DOXY at last visit - the wound is wider at this visit. She is in 3 layer compression 5/29; change to [facility] last week. I had given her empiric doxycycline 2 weeks ago for a week. She is in 3 layer compression. She complains of a lot of pain and drainage on presentation today. 6/5; using Hydrofera Blue. I gave her doxycycline recently empirically for erythema and pain around the wound. Believe her cultures showed enterococcus which not would not have been well covered by doxycycline nevertheless the wound looks better and I don't feel specifically that the enterococcus needs to be covered. She has a new what looks like a wrap injury on her lateral left ankle. 6/12; she is using Hydrofera Blue. She has a new area on the left anterior lower tibial area. This was a wrap injury last week. 6/19; the patient is using Hydrofera Blue. She arrived with marked inflammation and erythema around the wound and tenderness. 12/01/18 on evaluation today patient appears to be doing a little bit better based on what  I'm hearing from the standpoint of lassos evaluation to this as far as the overall appearance of the wound is concerned. Then sometime substandard she typically sees Dr. Dellia Nims. Nonetheless overall very pleased with the progress that she's made up to this point. No fevers, chills, nausea, or vomiting noted at this time. 7/10; some improvement in the surface area. Aggressively debrided last week apparently. I went ahead with the debridement today although the patient does not tolerate this very well. We have been using Iodoflex. Still a fair amount of drainage 7/17; slightly smaller. Using Iodoflex. 7/24; no change from last week in terms  of surface area. We have been using Iodoflex. Surface looks and continues to look somewhat better 7/31; surface area slightly smaller better looking surface. We have been using Iodoflex. This is under Unna boot compression 8/7-Patient presents at 1 week with Unna boot and Iodoflex, wound appears better 8/14-Patient presents at 1 week with Iodoflex, we use the Unna boot, wound appears to be stable better.Patient is getting Botox treatment for the inversion of the foot for tendon release, Next week 8/21; we are using Iodoflex. Unna boot. The wound is stable in terms of surface area. Under illumination there is some areas of the wound that appear to be either epithelialized or perhaps this is adherent slough at this point I was not really clear. It did not wipe off and I was reluctant to debride this today. 8/28; we are using Iodoflex in an Unna boot. Seems to be making good improvement. 9/4; using Iodoflex and wound is slightly smaller. 9/18; we are using Iodoflex with topical silver nitrate when she is here. The wound continues to be smaller 10/2; patient missed her appointment last week due to GI issues. She left and Iodoflex based dressing on for 2 weeks. Wound is about the same size about the size of a dime on the left medial lower 10/9 we have been using  Iodoflex on the medial left ankle wound. She has a new superficial probable wrap injury on the dorsal left ankle 10/16; we have been using Hydrofera Blue since last week. This is on the left medial ankle 10/23; we have been using Hydrofera Blue since 2 weeks ago. This is on the left medial ankle. Dimensions are better 11/6; using Hydrofera Blue. I think the wound is smaller but still not closed. Left medial ankle 11/13; we have been using Hydrofera Blue. Wound is certainly no smaller this week. Also the surface not as good. This is the remanent of a very large area on her left medial ankle. 11/20; using Sorbact since last week. Wound was about the same in terms of size although I was disappointed about the surface debris 12/11; 3-week follow-up. Patient was on vacation. Wound is measuring slightly larger we have been using Sorbact. 12/18; wound is about the same size however surface looks better last week after debridement. We have been using Sorbact under compression 1/15 wound is probably twice the size of last time increased in length nonviable surface. We have been using Sorbact. She was running a mild fever and missed her appointment last week 1/22; the wound is come down in size but under illumination still a very adherent debris we have been Hydrofera Blue that I changed her to last week 1/29; dimensions down slightly. We have been using Hydrofera Blue 2/19 dimensions are the same however there is rims of epithelialization under illumination. Therefore more the surface area may be epithelialized 2/26; the patient's wound actually measures smaller. The wound looks healthy. We have been using Hydrofera Blue. I had some thoughts about running Apligraf then I still may do that however this looks so much better this week we will delay that for now 3/5; the wound is small but about the same as last week. We have been using Hydrofera Blue. No debridement is required today. 3/19; the wound is about  the size of a dime. Healthy looking wound even under illumination. We have been using Hydrofera Blue. No mechanical debridement is necessary 3/26; not much change from last week although still looks very healthy. We have been using Hydrofera Blue  under Unna boots Patient was offered an ankle fusion by podiatry but not until the wound heals with a proceed with this. 4/9; the patient comes in today with her original wound on the medial ankle looking satisfactory however she has some uncontrolled swelling in the middle part of her leg with 2 new open areas superiorly just lateral to the tibia. I think this was probably a wrap issue. She said she felt uncomfortable during the week but did not call in. We have been using Hydrofera Blue 4/16; the wound on the medial ankle is about the same. She has innumerable small areas superior to this across her mid tibia. I think this is probably folliculitis. She is also been working in the yard doing a lot of sweating 4/30; the patient issue on the upper areas across her mid tibia of all healed. I think this was excessive yard work if I remember. Her wound on the medial ankle is smaller. Some debris on this we have been using Hydrofera Blue under Unna boots 5/7; mid tibia. She has been using Hydrofera Blue under an Unna wrap. She is apparently going for her ankle surgery on June 3 10/28/19-Patient returns to clinic with the ankle wound, we are using Hydrofera Blue under Unna wrap, surgery is scheduled for her left foot for June 3 so she will be back for nurse visit next week READMISSION 01/17/2020 Mrs. Timmers is a 65 year old woman we have had in this clinic for a long period of time with severe venous hypertension and refractory wounds on her medial lower legs and ankles bilaterally. 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 When she was off  her legs for a prolonged period example when she fell and suffered a compression fracture things would heal up quite nicely. She is now retired and we managed to heal up the right medial leg wound. The left one was very tiny last time I saw this although still refractory. 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 It got to the point where this was interfering with her walking and she elected to proceed with a ankle arthrodesis to straighten her her ankle and leave her with a functional outcome for mobilization. The patient was referred to Dr. Doren Custard and really this took some time to arrange. Dr. Doren Custard saw her on 12/07/2019. Once again he verified that she had no arterial issues. She had previously had an angiogram several years ago. Follow-up ABIs on the left showed an ABI of 1.12 with triphasic waveforms and a TBI of 0.92. She is felt to have chronic deep venous insufficiency but I do not think it was felt that anything could be done from about this from an ablation point of view. At the time Dr. Doren Custard saw this patient the wounds actually look closed via the pictures in his clinic. The patient finally underwent her surgery on 12/15/2019. This went reasonably well and there was a good anatomic outcome. She developed a small distal wound dehiscence on the lateral part of the surgical wound. However more problematically she is developed recurrence of the wound on the medial left ankle. There are actually 2 wounds here one in the distal lower leg and 1 pretty much at the level of the medial malleolus. It is a more distal area  that is more problematic. She has been using Hydrofera Blue which started on Friday before this she was simply Ace wrapping. There was a culture done that showed Pseudomonas and she is on ciprofloxacin. A recent CNS on 8/11 was negative. The patient reports some pain but I generally think this is  improving. She is using a cam boot completely nonweightbearing using a walker for pivot transfers and a wheelchair 8/24; not much improvement unfortunately she has a surgical wound on the lateral part in the venous insufficiency wound medially. The bottom part of the medial insufficiency wound is still necrotic there is exposed tendon here. We have been using Hydrofera Blue under compression. Her edema control is however better 8/31; patient in for follow-up of his surgical wound on the lateral part of her left leg and chronic venous insufficiency ulcers medially. We put her back in compression last week. She comes in today with a complaint of 3 or 4 days worth of increasing pain. She felt her cam walker was rubbing on the area on the back of her heel. However there is intense erythema seems more likely she has cellulitis. She had 2 cultures done when she was seeing podiatry in the postop. One of them in late July showed Pseudomonas and she received a course of ciprofloxacin the other was negative on 8/11 she is allergic to penicillin with anaphylactoid complaints of hives oral swelling via information in epic 9/9; when I saw this patient last week she had intense anterior erythema around her wound on the right lateral heel and ankle and also into the right medial heel. Some of this was no doubt drainage and her walker boot however I was convinced she had cellulitis. I gave her Levaquin and Bactrim she is finishing up on this now. She is following up with Dr. [name] he saw her yesterday. He is taken her out of the walking boot of course she is still nonweightbearing. Her x-ray was negative for any worrisome features such as soft tissue air etc. Things are a lot better this week. She has home health. We have been using Hydrofera Blue under an [employer] which she put back on yesterday. I did not wrap her last week 9/17; her surrounding skin looks a lot better. In fact the area on the left lateral ankle  has just a scant amount of eschar. The only remaining wound is the large area on the left medial ankle. Probably about 60% of this is healthy granulation at the surface however she has a significant divot distally. This has adherent debris in it. I been using debridement and silver collagen to try and get this area to fill-in although I do not think we have made much progress this week 9/24; the patient's wound on the left medial ankle looks a lot better. The deeper divot area distally still requires debridement but this is cleaning up quite nicely we have been using silver collagen. The patient is complaining of swelling in her foot and is worried that that is contributing to the nonhealing of the ankle wound. She is also complaining of numbness in her anterior toes 10/4; left medial ankle. The small area distally still has a divot with necrotic material that I have been debriding away. This has an undermining area. She is approved for Apligraf. She saw Dr. [name] her surgeon on 10/1. I think he declared himself is satisfied with the condition of things. Still nonweightbearing till the end of the month. We are dealing with the  venous insufficiency wounds on the medial ankle. Her surgical wound is well closed. There is no evidence of infection 10/11; the patient arrived in clinic today with the expectation that we be able to put an Apligraf on this area after debridement however she arrives with a relatively large amount of green drainage on the dressing. The patient states that this started on Friday. She has not been systemically unwell. 10/19; culture I did last week showed both Enterococcus and Pseudomonas. I think this came in separate parts because I stopped her ciprofloxacin I gave her and prescribed her linezolid however now looking at the final culture result this is Pseudomonas which is resistant to quinolones. She has not yet picked up the linezolid apparently phone issues. We are also  trying to get a topical antibiotic out of [facility] in [place] they can be applied by home health. She is still having green drainage 10/16; the patient has her topical antibiotic from [facility] in [place]. This is a compounded gel with vancomycin and ciprofloxacin and gentamicin. We are applying this on the wound bed with silver alginate over the top with Unna boot wraps. She arrives in clinic today with a lot less ominous looking drainage although she is only use this topical preparation once the second time today. She sees Dr. [name] her surgeon on Friday she has home health changing the dressing 11/2; still using her compounded topical antibiotic under silver alginate. Surface is cleaning up there is less drainage. We had an Apligraf for her today and I elected to apply it. A light coating of her antibiotic 04/25/2020 upon evaluation today patient appears to be doing well with regard to her ankle ulcer. There is a little bit of slough noted on the surface of the wound I am can have to perform sharp debridement to clear this away today. With that being said other than that fact overall I feel like she is making progress and we do see some new epithelial growth. There is also some improvement in the depth of the wound and that distal portion. There is little bit of slough there as well. 12/7; 2-week follow-up. Apligraf #3. Dimensions are smaller. Closing in especially inferiorly. Still some surface debris. Still using the [facility] topical antibiotic but I told her that I don't think this needs to be renewed 12/21; 2-week follow-up. Apligraf #4 dimensions are smaller. Nice improvement 06/05/2020; 2-week follow-up. The patient's wound on the left medial ankle looks really excellent. Nice granulation. Advancing epithelialization no undermining no evidence of infection. We would have to reapply for another Apligraf but with the condition of this wound I did not feel strongly about it. We  used Hydrofera Blue under the same degree of compression. She follows up with Dr. [name] her surgeon a week Friday 06/13/2020 upon evaluation today patient appears to be doing excellent in regard to her wound. She has been tolerating the dressing changes without complication. Fortunately there is no signs of active infection at this time. No fevers, chills, nausea, vomiting, or diarrhea. She was using Hydrofera Blue last week. 06/20/2020 06/20/2020 on evaluation today patient actually appears to be doing excellent in regard to her wound. This is measuring better and looking much better as well. She has been using the collagen that seems to be doing better for her as well even though the [facility] was and is not sticking or feeling as rough on her wound. She did see Dr. [name] on Friday he is very pleased he also  stated none of the hardware has shifted. That is great news 1/27; the patient has a small clean wound all that is remaining. I agree that this is too small to really consider an Apligraf. Under illumination the surface is looking quite good. We have been using collagen although the dimensions are not any better this week 2/2; the patient has a small clean wound on the left medial ankle. Although this left of her substantial original areas. Measurements are smaller. We have been using polymen Ag under an Haematologist. 2/10; small area on the left medial ankle. This looks clean nothing to debride however dimensions are about the same we have been using polymen I think now for 2 weeks 2/17; not much change in surface area. We have been using polymen Ag without any improvement. 3/17; 1 month follow-up. The patient has been using endoform without any improvement in fact I think this looks worse with more depth and more expansion 3/24; no improvement. Perhaps less debris on the surface. We have been using Sorbact for 1 week 4/4; wound measures larger. She has edema in her leg and her foot which she  tells as her wrap came down. We have been using Unna boots. Sorbact of the wound. She has been approved for Apligraf 09/12/2020 upon evaluation today patient appears to be doing well with regard to her wound. We did get the Apligraf reapproved which is great news we have that available for application today. Fortunately there is no signs of infection and overall the patient seems to be doing great. The wound bed is nice and clean. 4/27; patient presents for her second application of Apligraf. She states over the past week she has been on her feet more often due to being outside in her garden. She has noted more swelling to her foot as a result. She denies increased warmth, pain or erythema to the wound site. 10/10/2020 upon evaluation today patient appears to be doing well with regard to her wound which does not appear to be quite as irritated as last week from what I am hearing. With that being said unfortunately she is having issues with some erythema and warmth to touch as well as an increase size. I do believe this likely is infected. 10/17/2020 upon evaluation today patient appears to be doing excellent in regard to her wound this is significantly improved as compared to last week. Fortunately I think that the infection is much better controlled at this point. She did have evidence of both Enterococcus as well as Staphylococcus noted on culture. Enterococcus really would not be helped significantly by the Cipro but the wound is doing so much better I am under the assumption that the Staphylococcus is probably the main organism that is causing the current infection. Nonetheless I think that she is doing excellent as far as that is concerned and I am very pleased in that regard. I would therefore recommend she continue with the Cipro. 10/31/2020 upon evaluation today patient appears to be doing well with regard to her wound. She has been tolerating the dressing changes without complication. Fortunately  there is no signs of active infection and overall I am extremely pleased with where things stand today. No fevers, chills, nausea, vomiting, or diarrhea. With that being said she does have some green drainage coming from the wound and although it looks okay I am a little concerned about the possibility of a continuing infection. Specifically with Pseudomonas. For that reason I will go ahead and send  in a prescription for Cipro for her to be continued. 11/14/2020 upon evaluation today patient appears to be doing very well currently in regard to her wound on her leg. She has been tolerating the dressing changes without complication. Fortunately I feel like the infection is finally under good control here. Unfortunately we do not have the Apligraf for application today although we can definitely order to have it in place for next week. That will be her fifth and final of the current series. Nonetheless I feel like her wound is really doing quite well which is great news. 11/21/2020 upon evaluation today patient appears to be doing well with regard to her wound on the medial ankle. Fortunately I think the infection is under control and I do believe we can go ahead and reapply the Apligraf today. She is in agreement with that plan. There does not appear to be any signs of active infection at this time which is great news. No fevers, chills, nausea, vomiting, or diarrhea. 12/05/2020 upon evaluation today patient's wound bed actually showed signs of good granulation epithelization at this point. There does not appear to be any signs of infection which is great news and overall very pleased with where things stand. Overall the patient seems to be doing fairly well in my opinion with regard to her wound although I do believe she continues to build up a lot of biofilm I think she could benefit from using PuraPly at this point. 12/12/2020 upon evaluation today patient's wound actually appears to be doing decently well  today. The Unna boot has not been quite as well-tolerated so that more uncomfortable for her and even causing some pressure over the plate on the lateral portion of her foot which is 90 where the wound is. There did not appear to be any significant deep tissue injury with that there may be a minimal change in the skin noted I think that we may want to go back to the Coflex 2 layer which is a little bit easier on her skin it seems. 12/19/2020 upon evaluation today patient actually seems to be making great progress with the PuraPly currently. She in fact seems to be much better as far as the overall appearance of the wound bed is concerned I am very happy in this regard. I do not see any signs of of infection which is great news as well. No fevers, chills, nausea, vomiting, or diarrhea. 12/26/2020 upon evaluation today patient appears to actually be doing better in regard to her wound on the left medial ankle region. The surface of the wound is actually doing significantly better which is great news. There does not appear to be any signs of infection which is also great news and in general I am extremely pleased with where we stand today. 01/02/2021 upon evaluation today patient appears to be doing well with regard to her wound. In fact this is showing signs of excellent improvement and very pleased with where things stand. In fact the last 3 appointments have all shown signs of this getting smaller which is excellent news. I have not even had to perform any debridement and today is no exception. Overall I feel like this is dramatically improved compared to previous. T oday is PuraPly application #4. 07/16/863 upon evaluation today patient appears to be doing excellent in regard to her wound this is continue to show signs of improvement and overall I am extremely pleased with where we stand today. She is actually here for PuraPly  application #5. Every time we have applied this she is noted  definite improvement on measurements. 01/23/2021 upon evaluation today patient is actually making good progress in regard to her wound. This was actually on just a little bit longer this time compared to previous due to the fact that she did have to go out of town. She is actually here for PuraPly application #6. We have definitely been seeing improvements in the overall quality of the tissue on the surface of the wound which is awesome news. In general I think that the patient seems to be continuing to make great progress here. 01/30/2021 upon evaluation today patient's wound is actually doing excellent. There is really not any significant biofilm buildup which is great news and overall I am extremely pleased with where things stand today. There does not appear to be any signs of active infection. No fevers, chills, nausea, vomiting, or diarrhea. 02/06/2021 upon evaluation today patient's wound is actually showing signs of excellent improvement which is great news. We continue to see the benefit of the PuraPly this is doing a great job the wound seems not really irritated whatsoever and is showing signs of good granulation at this point. Overall I am extremely happy with what we are seeing. The patient likewise is happy to hear all of this as well. 02/13/2021 upon evaluation today patient appears to be doing well with regard to her wound. This again is measuring smaller today and I am very pleased in this regard. Fortunately there does not appear to be any signs of active infection at this time which is good news from a systemic standpoint. Locally there is still some significant drainage which she does have is concerned about infection locally. No fevers, chills, nausea, vomiting, or diarrhea. 02/20/2021 upon evaluation today patient actually appears to be doing decently well in regard to her wound. She has been tolerating the dressing changes without complication. I do believe the PuraPly is helping  wound bed does appear to be doing better. There is no evidence of active infection locally or systemically at this point visually although on fluorescence imaging there still appear to be bacterial load and bioburden noted. 02/27/2021 upon evaluation today patient fortunately continues to show signs of improvement with use of the PuraPly currently. Subsequently we did review her culture results and to be honest I think that the Bactrim is probably the best option to have her continue at this point. For that reason I am get a go ahead and send in a refill today for her for an additional 2 weeks. Nonetheless I think that we are making excellent progress here. It was Enterococcus and Staphylococcus that were noted she is allergic to penicillin so there is not much I can do from the Enterococcus standpoint though the staff does seem to be sensitive to the Bactrim. 03/06/2021 upon evaluation today patient appears to be doing well with regard to her wound. This is definitely showing signs of improvement which is great news and overall I am extremely pleased with where things stand today. There does not appear to be any signs of active infection which is great news and overall and I do believe that we are headed in the appropriate direction I think the PuraPly is doing an awesome job for her. 03/20/2021 upon evaluation today patient actually appears to be doing well with regard to her wound. We have been using the PuraPly although last week when I was out of the office they actually just used endoform  nonetheless this still seems to be doing great. Fortunately there does not appear to be any signs of active infection which is great news overall I think the patient is making wonderful progress. 03/27/2021 upon evaluation today patient appears to be doing well with regard to her wound in fact there is a little drainage but otherwise her does not appear to be any major signs of open wounds nor infection at this  time. Overall I am extremely pleased with where things stand. No fevers, chills, nausea, vomiting, or diarrhea. 04/03/2021 upon evaluation today patient presents for reevaluation here in the clinic and overall she seems to be doing quite well. Fortunately there does not appear to be any signs of active infection which is great news and in general I am very pleased with how things appear. There is still a very small opening remaining but in general she is looking much better. Fortunately I do not think there is any need for the PuraPly at this point. Electronic Signature(s) Signed: 04/03/2021 3:39:04 PM By: [name] PA-C Entered By: [name] on 04/03/2021 15:39:04 -------------------------------------------------------------------------------- Physical Exam Details Patient Name: Date of Service: [facility] [name], [name] 04/03/2021 10:00 A M Medical Record Number: [id number] Patient Account Number: [id number] Date of Birth/Sex: Treating RN: August 06, 1955 (65 y.o. [name] Provider: [name] Other Clinician: Referring Provider: Treating Provider/Extender: [name] in Treatment: 69 Constitutional Well-nourished and well-hydrated in no acute distress. Respiratory normal breathing without difficulty. Psychiatric this patient is able to make decisions and demonstrates good insight into disease process. Alert and Oriented x 3. pleasant and cooperative. Notes Upon inspection patient's wound bed actually showed signs of good granulation epithelization at this point. Fortunately there does not appear to be any signs of infection I did perform a light cleaning with the 3 mm curette in order to clear away some of the dry skin around the edges of the wound. She tolerated that today without complication and postdebridement the wound bed appears to be doing much better which is great news. Electronic Signature(s) Signed: 04/03/2021 3:39:54 PM  By: [name] PA-C Entered By: [name] on 04/03/2021 15:39:54 -------------------------------------------------------------------------------- Physician Orders Details Patient Name: Date of Service: [name] [name], [name] 04/03/2021 10:00 [place] Record Number: [id number] Patient Account Number: [id number] Date of Birth/Sex: Treating RN: 1955-07-01 (65 y.o. [name], [name] Primary Care Provider: [name] Other Clinician: Referring Provider: Treating Provider/Extender: [name] in Treatment: 7 Verbal / Phone Orders: No Diagnosis Coding ICD-10 Coding Code Description [phone] Chronic venous hypertension (idiopathic) with ulcer and inflammation of left lower extremity L97.828 Non-pressure chronic ulcer of other part of left lower leg with other specified severity L97.328 Non-pressure chronic ulcer of left ankle with other specified severity Follow-up Appointments ppointment in 1 week. Margarita Grizzle Return A Bring in compression stocking to left leg in each week. Bathing/ Shower/ Hygiene May shower with protection but do not get wound dressing(s) wet. Edema Control - Lymphedema / SCD / Other Elevate legs to the level of the heart or above for 30 minutes daily and/or when sitting, a frequency of: - throughout the day Avoid standing for long periods of time. Exercise regularly Compression stocking or Garment 20-30 mm/Hg pressure to: - right leg daily Additional Orders / Instructions Follow Nutritious Diet Wound Treatment Wound #15 - Malleolus Wound Laterality: Left, Medial Cleanser: Soap and Water 1 x Per Week/30 Days Discharge  Instructions: May shower and wash wound with dial antibacterial soap and water prior to dressing change. Peri-Wound Care: Triamcinolone 15 (g) 1 x Per Week/30 Days Discharge Instructions: Use triamcinolone 15 (g) as directed Peri-Wound Care: Sween Lotion (Moisturizing lotion) 1 x Per Week/30 Days Discharge  Instructions: Apply moisturizing lotion as directed Prim Dressing: Promogran Prisma Matrix, 4.34 (sq in) (silver collagen) 1 x Per Week/30 Days ary Discharge Instructions: Moisten collagen with saline or hydrogel Prim Dressing: adaptic 1 x Per Week/30 Days ary Discharge Instructions: over the Prisma. Secondary Dressing: Woven Gauze Sponge, Non-Sterile 4x4 in 1 x Per Week/30 Days Discharge Instructions: to bolster skin sub Secondary Dressing: ABD Pad, 5x9 1 x Per Week/30 Days Discharge Instructions: Apply over primary dressing and lateral ankle Secondary Dressing: Optifoam Non-Adhesive Dressing, 4x4 in 1 x Per Week/30 Days Discharge Instructions: Apply over primary dressing pad lateral foot with foam donut Compression Wrap: CoFlex TLC XL 2-layer Compression System 4x7 (in/yd) 1 x Per Week/30 Days Discharge Instructions: Apply CoFlex 2-layer compression as directed. (alt for 4 layer) Electronic Signature(s) Signed: 04/03/2021 6:26:15 PM By: [name] PA-C Signed: 04/04/2021 6:11:26 PM By: Deon Pilling RN, BSN Entered By: Deon Pilling on 04/03/2021 11:20:16 -------------------------------------------------------------------------------- Problem List Details Patient Name: Date of Service: [name] [name], [name] 04/03/2021 10:00 A M Medical Record Number: [id number] Patient Account Number: [id number] Date of Birth/Sex: Treating RN: November 23, 1955 (65 y.o. [name] Provider: [name] Other Clinician: Referring Provider: Treating Provider/Extender: [name] in Treatment: 95 Active Problems ICD-10 Encounter Code Description Active Date MDM Diagnosis I87.332 Chronic venous hypertension (idiopathic) with ulcer and inflammation of left 01/17/2020 No Yes lower extremity L97.828 Non-pressure chronic ulcer of other part of left lower leg with other specified 01/17/2020 No Yes severity L97.328 Non-pressure chronic ulcer of left ankle with  other specified severity 01/17/2020 No Yes Inactive Problems ICD-10 Code Description Active Date Inactive Date L03.116 Cellulitis of left lower limb 01/31/2020 01/31/2020 T81.31XD Disruption of external operation (surgical) wound, not elsewhere classified, subsequent 01/17/2020 01/17/2020 encounter Resolved Problems Electronic Signature(s) Signed: 04/03/2021 10:13:10 AM By: [name] PA-C Entered By: [name] on 04/03/2021 10:13:10 -------------------------------------------------------------------------------- Progress Note Details Patient Name: Date of Service: [facility] [name], [name] 04/03/2021 10:00 [place] Record Number: [id number] Patient Account Number: [id number] Date of Birth/Sex: Treating RN: November 01, 1955 (65 y.o. [name] Provider: [name] Other Clinician: Referring Provider: Treating Provider/Extender: [name] in Treatment: 80 Subjective Chief Complaint Information obtained from Patient patient is been followed long-term in this clinic for venous insufficiency ulcers with inflammation, hypertension and ulceration over the medial ankle bilaterally. 01/17/2020; this is a patient who is here for review of postoperative wounds on the left lateral ankle and recurrence of venous stasis ulceration on the left medial History of Present Illness (HPI) the remaining wound is over the left medial ankle. Similar wound over the right medial ankle healed largely with use of Apligraf. Most recently we have been using Hydrofera Blue over this wound with considerable improvement. The patient has been extensively worked up in the past for her venous insufficiency and she is not a candidate for antireflux surgery although I have none of the details available currently. 08/24/14; considerable improvement today. About 50% of this wound areas now epithelialized. The base of the wound appears to be healthier granulation.as opposed to  last week when she had deteriorated a considerable improvement 08/17/14; unfortunately  the wound has regressed somewhat. The areas of epithelialization from the superior aspect are not nearly as healthy as they were last week. The patient thinks her Hydrofera Blue slipped. 09/07/14; unfortunately the area has markedly regressed in the 2 weeks since I've seen this. There is an odor surrounding erythema. The healthy granulation tissue that we had at the base of the wound now is a dusky color. The nurse reports green drainage 09/14/14; the area looks somewhat better than last week. There is less erythema and less drainage. The culture I did did not show any growth. Nevertheless I think it is better to continue the Cipro and doxycycline for a further week. The remaining wound area was debridement. 09/21/14. Wound did not require debridement last week. Still less erythema and less drainage. She can complete her antibiotics. The areas of epithelialization in the superior aspect of the wound do not look as healthy as they did some weeks ago 10/05/14 continued improvement in the condition of this wound. There is advancing epithelialization. Less aggressive debridement required 10/19/14 continued improvement in the condition and volume of this wound. Less aggressive debridement to the inferior part of this to remove surface slough and fibrinous eschar 11/02/14 no debridement is required. The surface granulation appears healthy although some of her islands of epithelialization seem to have regressed. No evidence of infection 11/16/14; lites surface debridement done of surface eschar. The wound does not look to be unhealthy. No evidence of infection. Unfortunately the patient has had podiatry issues in the right foot and for some reason has redeveloped small surface ulcerations in the medial right ankle. Her original presentation involved wounds in this area 11/23/14 no debridement. The area on the right ankle has  enlarged. The left ankle wound appears stable in terms of the surface although there is periwound inflammation. There has been regression in the amount of new skin 11/30/14 no debridement. Both wound areas appear healthy. There was no evidence of infection. The the new area on the right medial ankle has enlarged although that both the surfaces appear to be stable. 12/07/14; Debridement of the right medial ankle wound. No no debridement was done on the left. 12/14/14 no major change in and now bilateral medial ankle wounds. Both of these are very painful but the no overt evidence of infection. She has had previous venous ablation 12/21/14; patient states that her right medial ankle wound is considerably more painful last week than usual. Her left is also somewhat painful. She could not tolerate debridement. The right medial ankle wound has fibrinous surface eschar 12/28/14 this is a patient with severe bilateral venous insufficiency ulcers. For a considerable period of time we actually had the one on the right medial ankle healed however this recently opened up again in June. The left medial ankle wound has been a refractory area with some absent flows. We had some success with Hydrofera Blue on this area and it literally closed by 50% however it is recently opened up Foley. Both of these were debridement today of surface eschar. She tolerates this poorly 01/25/15: No change in the status of this. Thick adherent escar. Very poor tolerance of any attempt at debridement. I had healed the right medial malleolus wound for a considerable amount of time and had the left one down to about 50% of the volume although this is totally regressed over the last 48 weeks. Further the right leg has reopened. she is trying to make a appointment with pain and vascular, previous ablations with  Dr. Aleda Grana. I do not believe there is an arterial insufficiency issue here 02/01/15 the status of the adherent eschar bilaterally  is actually improved. No debridement was done. She did not manage to get vascular studies done 02/08/15 continued debridement of the area was done today. The slough is less adherent and comes off with less pressure. There is no surrounding infection peripheral pulses are intact 02/15/15 selective debridement with a disposable curette. Again the slough is less adherent and comes off with less difficulty. No surrounding infection peripheral pulses are intact. 02/22/15 selective debridement of the right medial ankle wound. Slough comes off with less difficulty. No obvious surrounding infection peripheral pulses are intact I did not debridement the one on the left. Both of these are stable to improved 03/01/15 selective debridement of both wound areas using a curette to. Adherent slough cup soft with less difficulty. No obvious surrounding infection. The patient tells me that 2 days ago she noted a rash above the right leg wrap. She did not have this on her lower legs when she change this over she arrives with widespread left greater than right almost folliculitis-looking rash which is extremely pruritic. I don't see anything to culture here. There is no rash on the rest of her body. She feels well systemically. 03/08/15; selective debridement of both wounds using a curette. Base of this does not look unhealthy. She had limegreen drainage coming out of the left leg wound and describes a lot of drainage. The rash on her left leg looks improved to. No cultures were done. 03/22/15; patient was not here last week. Basal wounds does not look healthy and there is no surrounding erythema. No drainage. There is still a rash on the left leg that almost looks vasculitic however it is clearly limited to the top of where the wrap would be. 04/05/15; on the right required a surgical debridement of surface eschar and necrotic subcutaneous tissue. I did not debridement the area on the left. These continue to be large open  wounds that are not changing that much. We were successful at one point in healing the area on the right, and at the same time the area on the left was roughly half the size of current measurements. I think a lot of the deterioration has to do with the prolonged time the patient is on her feet at work 04/19/15 I attempted-like surface debridement bilaterally she does not tolerate this. She tells me that she was in allergic care yesterday with extreme pain over her left lateral malleolus/ankle and was told that she has an "sprain" 05/03/15; large bilateral venous insufficiency wounds over the medial malleolus/medial aspect of her ankles. She complains of copious amounts of drainage and his usual large amounts of pain. There is some increasing erythema around the wound on the right extending into the medial aspect of her foot to. historically she came in with these wounds the right one healed and the left one came down to roughly half its current size however the right one is reopened and the left is expanded. 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 05/10/15 large bilateral venous insufficiency wounds. There is less adherence surface left however the surface culture that I did last week grew pseudomonas therefore bilateral selective debridement score necessary. There is surrounding erythema. The patient describes severe bilateral drainage and a lot of pain in the left ankle. Apparently her podiatrist was were ready  to do a cortisone shot 05/17/15; the patient complains of pain and again copious amounts of drainage. 05/24/15; we used Iodo flex last week. Patient notes considerable improvement in wound drainage. Only needed to change this once. 05/31/15; we continued Iodoflex; the base of these large wounds bilaterally is not too bad but there is probably likely a significant bioburden here. I would like to debridement just doesn't tolerate it. 06/06/14 I  would like to continue the Iodoflex although she still hasn't managed to obtain supplies. She has bilateral medial malleoli or large wounds which are mostly superficial. Both of them are covered circumferentially with some nonviable fibrinous slough although she tolerates debridement very poorly. She apparently has an appointment for an ablation on the right leg by interventional radiology. 06/14/15; the patient arrives with the wounds and static condition. We attempted a debridement although she does not do well with this secondary to pain. I 07/05/15; wounds are not much smaller however there appears to be a cleaner granulating base. The left has tight fibrinous slough greater than the right. Debridement is tolerated poorly due to pain. Iodoflex is done more for these wounds in any of the multitude of different dressings I have tried on the left 1 and then subsequently the right. 07/12/15; no change in the condition of this wound. I am able to do an aggressive debridement on the right but not the left. She simply cannot tolerate it. We have been using Iodoflex which helps somewhat. It is worthwhile remembering that at one point we healed the right medial ankle wound and the left was about 25% of the current circumference. We have suggested returning to vascular surgery for review of possible further ablations for one reason or another she has not been able to do this. 07/26/15 no major change in the condition of either wound on her medial ankle. I did not attempt to debridement of these. She has been aggressively scrubbing these while she is in the shower at home. She has her supply of Iodoflex which seems to have done more for these wounds then anything I have put on recently. 08/09/15 wound area appears larger although not verified by measurements. Using Iodoflex 09/05/2015 -- she was here for avisit today but had significant problems with the wound and I was asked to see her for a physician opinion. I  have summarize that this lady has had surgery on her left lower extremity about 10 years ago where the possible veins stripping was done. She has had an opinion from interventional radiology around November 2016 where no further sclerotherapy was ordered. The patient works 12 hours a day and stands on a concrete floor with work boots and is unable to get the proper compression she requires and cannot elevate her limbs appropriately at any given time. She has recently grown Pseudomonas from her wound culture but has not started her ciprofloxacin which was called in for her. 09/13/15 this continues to be a difficult situation for this patient. At one point I had this wound down to a 1.5 x 1.5" wound on her left leg. This is deteriorated and the right leg has reopened. She now has substantial wounds on her medial calcaneus, malleoli and into her lower leg. One on the left has surface eschar but these are far too painful for me to debridement here. She has a vascular surgery appointment next week to see if anything can be done to help here. I think she has had previous ablations several years ago at  [facility]. She has no major edema. She tells me that she did not get product last time [facility]] and went for several days without it. She continues to work in work boots 12 hours a day. She cannot get compression/4-layer under her work boots. 09/20/15 no major change. Periwound edema control was not very good. Her point with pain and vascular is next Wednesday the 25th 09/28/15; the patient is seen vascular surgery and is apparently scheduled for repeat duplex ultrasounds of her bilateral lower legs next week. 10/05/15; the patient was seen by Dr. Doren Custard of vascular surgery. He feels that she should have arterial insufficiency excluded as cause/contributed to her nonhealing stage she is therefore booked for an arteriogram. She has apparently monophasic signals in the dorsalis pedis pulses. She also of course  has known severe chronic venous insufficiency with previous procedures as noted previously. I had another long discussion with the patient today about her continuing to work 12 hour shifts. I've written her out for 2 months area had concerns about this as her work location is currently undergoing significant turmoil and this may lead to her termination. She is aware of this however I agree with her that she simply cannot continue to stand for 12 hours multiple days a week with the substantial wound areas she has. 10/19/15; the Dr. Doren Custard appointment was largely for an arteriogram which was normal. She does not have an arterial issue. He didn't make a comment about her chronic venous insufficiency for which she has had previous ablations. Presumably it was not felt that anything additional could be done. The patient is now out of work as I prescribed 2 weeks ago. Her wounds look somewhat less aggravated presumably because of this. I felt I would give debridement another try today 10/25/15; no major change in this patient's wounds. We are struggling to get her product that she can afford into her own home through her insurance. 11/01/15; no major change in the patient's wounds. I have been using silver alginate as the most affordable product. I spoke to Dr. Marla Roe last week with her requested take her to the OR for surgical debridement and placement of ACEL. Dr. Marla Roe told me that she would be willing to do this however [facility] will not cover this, fortunately the patient has [place] healthcare of some variant 11/08/15; no major change in the patient's wounds. She has been completely nonviable surface that this but is in too much pain with any attempted debridement are clinic. I have arranged for her to see Dr. Marla Roe ham of plastic surgery and this appointment is on Monday. I am hopeful that they will take her to the OR for debridement, possible ACEL ultimately possible skin  graft 11/22/15 no major change in the patient's wounds over her bilateral medial calcaneus medial malleolus into the lower legs. Surface on these does not look too bad however on the left there is surrounding erythema and tenderness. This may be cellulitis or could him sleepy tinea. 11/29/15; no major changes in the patient's wounds over her bilateral medial malleolus. There is no infection here and I don't think any additional antibiotics are necessary. There is now plan to move forward. She sees Dr. Marla Roe in a week's time for preparation for operative debridement and ACEL placement I believe on 7/12. She then has a follow-up appointment with Dr. Marla Roe on 7/21 12/28/15; the patient returns today having been taken to the [place] by Dr. Marla Roe 12/12/15 she underwent debridement, intraoperative  cultures [which were negative]. She had placement of a wound VAC. Parent really ACEL was not available to be placed. The wound VAC foam apparently adhered to the wound since then she's been using silver alginate, Xeroform under Ace wraps. She still says there is a lot of drainage and a lot of pain 01/31/16; this is a patient I see monthly. I had referred her to Dr. Marla Roe him of plastic surgery for large wounds on her bilateral medial ankles. She has been to the OR twice once in early July and once in early August. She tells me over the last 3 weeks she has been using the wound VAC with ACEL underneath it. On the right we've simply been using silver alginate. Under Kerlix Coban wraps. 02/28/16; this is a patient I'm currently seeing monthly. She is gone on to have a skin graft over her large venous insufficiency ulcer on the left medial ankle. This was done by Dr. Marla Roe him. The patient is a bit perturbed about why she didn't have one on her right medial ankle wound. She has been using silver alginate to this. 03/06/16; I received a phone call from her plastic surgery Dr. Marla Roe. She expressed some  concern about the viability of the skin graft she did on the left medial ankle wound. Asked me to place Endoform on this. She told me she is not planning to do a subsequent skin graft on the right as the left one did not take very well. I had placed Hydrofera Blue on the right 03/13/16; continue to have a reasonably healthy wound on the right medial ankle. Down to 3 mm in terms of size. There is epithelialization here. The area on the left medial ankle is her skin graft site. I suppose the last week this looks somewhat better. She has an open area inferiorly however in the center there appears to be some viable tissue. There is a lot of surface callus and eschar that will eventually need to come off however none of this looked to be infected. Patient states that the is able to keep the dressing on for several days which is an improvement. 03/20/16 no major change in the circumference of either wound however on the left side the patient was at Dr. Eusebio Friendly office and they did a debridement of left wound. 50% of the wound seems to be epithelialized. I been using Endoform on the left Hydrofera Blue in the right 03/27/16; she arrives today with her wound is not looking as healthy as they did last week. The area on the right clearly has an adherent surface to this a very similar surface on the left. Unfortunately for this patient this is all too familiar problem. Clearly the Endoform is not working and will need to change that today that has some potential to help this surface. She does not tolerate debridement in this clinic very well. She is changing the dressing wants 04/03/16; patient arrives with the wounds looking somewhat better especially on the right. Dr. Migdalia Dk change the dressing to silver alginate when she saw her on Monday and also sold her some compression socks. The usefulness of the latter is really not clear and woman with severely draining wounds. 04/10/16; the patient is doing a bit of  an experiment wearing the compression stockings that Dr. Migdalia Dk provided her to her left leg and the out of legs based dressings that we provided to the right. 05/01/16; the patient is continuing to wear compression stockings Dr. Migdalia Dk provided her  on the left that are apparently silver impregnated. She has been using Iodoflex to the right leg wound. Still a moderate amount of drainage, when she leaves here the wraps only last for 4 days. She has to change the stocking on the left leg every night 05/15/16; she is now using compression stockings bilaterally provided by Dr. Marla Roe. She is wearing a nonadherent layer over the wounds so really I don't think there is anything specific being done to this now. She has some reduction on the left wound. The right is stable. I think all healing here is being done without a specific dressing 06/09/16; patient arrives here today with not much change in the wound certainly in diameter to large circular wounds over the medial aspect of her ankle bilaterally. Under the light of these services are certainly not viable for healing. There is no evidence of surrounding infection. She is wearing compression stockings with some sort of silver impregnation as prescribed by Dr. Marla Roe. She has a follow-up with her tomorrow. 06/30/16; no major change in the size or condition of her wounds. These are still probably covered with a nonviable surface. She is using only her purchase stockings. She did see Dr. Marla Roe who seemed to want to apply Dakin's solution to this I'm not extreme short what value this would be. I would suggest Iodoflex which she still has at home. 07/28/16; I follow Mrs. Wenk episodically along with Dr. Marla Roe. 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 She has been applying a topical collagen ointment to both wounds with Adaptic. I don't think Dr. Marla Roe is planning to take her  back to the OR. 08/19/16; I follow Mrs. Jeneen Rinks on a monthly basis along with Dr. Marla Roe of plastic surgery. 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 I been following her for a number of years. At one point I was able to get the right medial malleolus wound to heal and had the left medial malleolus down to about half its current size however and I had to send her to plastic surgery for an operative debridement. Since then things have been stable to slightly improve the area on the right is slightly better one in the left about the same although there is much less adherent surface than I'm used to with this patient. She is using some form of liquid collagen gel that Dr. Marla Roe provided a Kerlix cover with the patient's own pressure stockings. She tells me that she has extreme pain in both ankles and along the lateral aspect of both feet. She has been unable to work for some period of time. She is telling me she is retiring at the beginning of April. She sees Dr. Doran Durand of orthopedics next week 09/22/16; patient has not seen Dr. Marla Roe since the last time she is here. I'm not really sure what she is using to the wounds other than bits and pieces of think she had left over including most recently Hydrofera Blue. She is using juxtalite stockings. She is having difficulty with her husband's recent illness "stroke". She is having to transport him to various doctors appointments. Dr. Marla Roe left her the option of a repeat debridement with ACEL however she has not been able to get the time to follow-up on this. She continues to have a fair amount of drainage out of these wounds with certainly precludes leaving dressings on all week 10/13/16; patient  has not seen Dr. Marla Roe since she was last in our clinic. I'm not really sure what she is doing with the wounds, we did try to get her [facility] and I think she is actually  using this most of the time. Because of drainage she states she has to change this every second day although this is an improvement from what she used to do. She went to see Dr. Doran Durand who did not think she had a muscular issue with regards to her feet, he referred her to a neurologist and I think the appointment is sometime in June. I changed her back to Iodoflex which she has used in the past but not recently. 11/03/16; the patient has been using Iodoflex although she ran out of this. Still claims that there is a lot of drainage although the wound does not look like this. No surrounding erythema. She has not been back to see Dr. Marla Roe 11/24/16; the patient has been using Iodoflex again but she ran out of it 2 or 3 days ago. There is no major change in the condition of either one of these wounds in fact they are larger and covered in a thick adherent surface slough/nonviable tissue especially on the left. She does not tolerate mechanical debridement in our clinic. Going back to see Dr. Marla Roe of plastic surgery for an operative debridement would seem reasonable. 12/15/16; the patient has not been back to see Dr. Marla Roe. She is been dealing with a series of illnesses and her husband which of monopolized her time. She is been using Sorbact which we largely supplied. She states the drainage is bad enough that it maximum she can go 2-3 days without changing the dressing 01/12/2017 -- the patient has not been back for about 4 weeks and has not seen Dr. Marla Roe not does she have any appointment pending. 01/23/17; patient has not seen Dr. Marla Roe even though I suggested this previously. She is using Santyl that was suggested last week by Dr. Con Memos this Cost her $16 through her insurance which is indeed surprising 02/12/17; continuing Santyl and the patient is changing this daily. A lot of drainage. She has not been back to see plastic surgery she is using an Ace wrap. Our intake nurse suggested  wrap around stockings which would make a good reasonable alternative 02/26/17; patient is been using Santyl and changing this daily due to drainage. She has not been to see plastic surgery she uses in April Ace wrap to control the edema. She did obtain extremitease stockings but stated that the edema in her leg was to big for these 03/20/17; patient is using Santyl and Anasept. Surfaces looked better today the area on the right is actually measuring a little smaller. She has states she has a lot of pain in her feet and ankles and is asking for a consult to pain control which I'll try to help her with through our case manager. 04/10/17; the patient arrives with better-looking wound surfaces and is slightly smaller wound on the left she is using a combination of Santyl and Anasept. She has an appointment or at least as started in the pain control center associated with [facility] 05/14/17; this is a patient who I followed for a prolonged period of time. She has venous insufficiency ulcers on her bilateral medial ankles. At one point I had this down to a much smaller wound on the left however these reopened and we've never been able to get these to heal. She has  been using Santyl and Anasept gel although 2 weeks ago she ran out of the Anasept gel. She has a stable appearance of the wound. She is going to the wound care clinic at [facility]. They wanted do a nerve block/spinal block although she tells me she is reluctant to go forward with that. 05/21/17; this is a patient I have followed for many years. She has venous insufficiency ulcers on her bilateral medial ankles. Chronic pain and deformity in her ankles as well. She is been to see plastic surgery as well as orthopedics. Using PolyMem AG most recently/Kerramax/ABDs and 2 layer compression. She has managed to keep this on and she is coming in for a nurse check to change the dressing on Tuesdays, we see her on Fridays 06/05/17; really quite  a good looking surface and the area especially on the right medial has contracted in terms of dimensions. Well granulated healthy-looking tissue on both sides. Even with an open curet there is nothing that even feels abnormal here. This is as good as I've seen this in quite some time. We have been using PolyMem AG and bringing her in for a nurse check 06/12/17; really quite good surface on both of these wounds. The right medial has contracted a bit left is not. We've been using PolyMem and AG and she is coming in for a nurse visit 06/19/17; we have been using PolyMem AG and bringing her in for a nurse check. Dimensions of her wounds are not better but the surfaces looked better bilaterally. She complained of bleeding last night and the left wound and increasing pain bilaterally. She states her wound pain is more neuropathic than just the wounds. There was some suggestion that this was radicular from her pain management doctor in talking to her it is really difficult to sort this out. 06/26/17; using PolyMem and AG and bringing her in for a nurse check as All of this and reasonably stable condition. Certainly not improved. The dimensions on the lateral part of the right leg look better but not really measuring better. The medial aspect on the left is about the same. 07/03/16; we have been using PolyMen AG and bringing her in for a nurse check to change the dressings as the wounds have drainage which precludes once weekly changing. We are using all secondary absorptive dressings.our intake nurse is brought up the idea of using a wound VAC/snap VAC on the wound to help with the drainage to see if this would result in some contraction. This is not a bad idea. The area on the right medial is actually looking smaller. Both wounds have a reasonable-looking surface. There is no evidence of cellulitis. The edema is well controlled 07/10/17; the patient was denied for a snap VAC by her insurance. The major issue with  these wounds continues to be drainage. We are using wicked PolyMem AG and she is coming in for a nurse visit to change this. The wounds are stable to slightly improved. The surface looks vibrant and the area on the right certainly has shrunk in size but very slowly 07/17/17; the patient still has large wounds on her bilateral medial malleoli. Surface of both of these wounds looks better. The dimensions seem to come and go but no consistent improvement. There is no epithelialization. We do not have options for advanced treatment products due to insurance issues. They did not approve of the wound VAC to help control the drainage. More recently we've been using PolyMem and AG wicked  to allow drainage through. We have been bringing her in for a nurse visit to change this. We do not have a lot of options for wound care products and the home again due to insurance issues 07/24/17; the patient's wound actually looks somewhat better today. No drainage measurements are smaller still healthy-looking surface. We used silver collagen under PolyMen started last week. We have been bringing her in for a dressing change 07/31/17; patient's wound surface continued to look better and I think there is visible change in the dimensions of the wound on the right. Rims of epithelialization. We have been using silver collagen under PolyMen and bringing her in for a dressing change. There appears to be less drainage although she is still in need of the dressing change 08/07/17. Patient's wound surface continues to look better on both sides and the area on the right is definitely smaller. We have been using silver collagen and PolyMen. She feels that the drainage has been it has been better. I asked her about her vascular status. She went to see Dr. Aleda Grana at [facility] and had some form of ablation. I don't have much detail on this. I haven't my notes from 2016 that she was not a candidate for any further ablation but I  don't have any more information on this. We had referred her to vein and vascular I don't think she ever went. He does not have a history of PAD although I don't have any information on this either. We don't even have ABIs in our record 08/14/17; we've been using silver collagen and PolyMen cover. And putting the patient and compression. She we are bringing her in as a nurse visit to change this because ofarge amount of drainage. We didn't the ABIs in clinic today since they had been done in many moons 1.2 bilaterally. She has been to see vein and vascular however this was at [facility] and she had ablation although I really don't have any information on this all seemed biking get a report. She is also been operatively debrided by plastic surgery and had a cell placed probably 8-12 months ago. This didn't have a major effect. We've been making some gains with current dressings 08/19/17-She is here in follow-up evaluation for bilateral medial malleoli ulcers. She continues to tolerate debridement very poorly. We will continue with recently changed topical treatment; if no significant improvement may consider switching to Iodosorb/Iodoflex. 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 She has been using silver collagen and PolyMem. I believe she has been used and tried on Iodoflex before. 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 These have been very refractory over time. We have been using silver collagen and PolyMen. She is coming in once a week for a doctor's and once a week for nurse visits. We are actually making some progress 09/18/17; the patient's wounds are smaller especially on the right medial. She arrives today to upset to consider even washing these off  with Anasept which I think is been part of the reason this is been closing. We've been using collagen covered in PolyMen otherwise. It is noted that she has a small area of folliculitis on the right medial calf that. As we are wrapping her legs I'll give her a short course of  doxycycline to make sure this doesn't amount to anything. She is a long list of complaints today including imbalance, shortness of breath on exertion, inversion of her left ankle. With regards to the latter complaints she is been to see orthopedics and they offered her a tendon release surgery I believe but wanted her wounds to be closed first. I have recommended she go see her primary physician with regards to everything else. 09/25/17; patient's wounds are about the same size. We have made some progress bilaterally although not in recent weeks. She will not allow me T wash these wounds with Anasept even if she is doing her cell. Wheeze we've been using collagen covered in PolyMen. Last week she had a small area of folliculitis this is now opened into a small wound. She completed 5 days of trimethoprim sulfamethoxazole 10/02/17; unfortunately the area on her left medial ankle is worse with a larger wound area towards the Achilles. The patient complains of a lot of pain. She will not allow debridement although visually I don't think there is anything to debridement in any case. We have been using silver collagen and PolyMen for several months now. Initially we are making some progress although I'm not really seeing that today. We will move back to [facility]. His admittedly this is a bit of a repeat however I'm hoping that his situation is different now. The patient tells me she had her leg on the left give out on her yesterday this is process some pain. 10/09/17; the patient is seen twice a week largely because of drainage issues coming out of the chronic medial bimalleolar wounds that are chronic. Last week the dimensions of  the one on the left looks a little larger I changed her to [facility]. She comes in today with a history of terrible pain in the bilateral wound areas. She will not allow debridement. She will not even allow a tissue culture. There is no surrounding erythema no no evidence of cellulitis. We have been putting her Kerlix Coban man. She will not allow more aggressive compression as there was a suggestion to put her in 3 layer wraps. 10/16/17; large wounds on her bilateral medial malleoli. These are chronic. Not much change from last week. The surface looks have healthy but absolutely no epithelialization. A lot of pain little less so of drainage. She will not allow debridement or even washing these off in the vigorous fashion with Anasept. 10/23/17; large wounds on her bilateral malleoli which are chronic. Some improvement in terms of size perhaps on the right since last time I saw these. She states that after we increased the 3 layer compression there was some bleeding, when she came in for a nurse visit she did not want 3 layer compression put back on about our nurse managed to convince her. She has known chronic venous visit issues and I'm hoping to get her to tolerate the 3 layer compression. using Hydrofera Blue 10/30/17; absolutely no change in the condition of either wound although we've had some improvement in dimensions on the right.. Attempted to put her in 3 layer compression she didn't tolerated she is back in 2 layer compression. We've been using Hydrofera Blue We looked over her past records. She had venous reflux studies in November 2016. There was no evidence of deep venous reflux on the right. Superficial vein did not show the greater saphenous vein at think this is been previously ablated the small saphenous vein was within normal limits. The left  deep venous system showed no DVT the vessels were positive for deep venous reflux in the posterior tibial veins at the ankle. The greater  saphenous vein was surgically absent small saphenous vein was within normal limits. She went to vein and vascular at [facility]. I believe she had an ablation on the left greater saphenous vein. I'll update her reflux studies perhaps ever reviewed by vein and vascular. We've made absolutely no progress in these wounds. Will also try to read and TheraSkins through her insurance 11/06/17; W the patient apparently has a 2 week follow-up with vein and vascular I like him to review the whole issue with regards to her previous vascular workup by Dr. Aleda Grana. We've really made no progress on these wounds in many months. She arrives today with less viable looking surface on the left medial ankle wound. This was apparently looking about the same on Tuesday when she was here for nurse visit. 11/13/17; deep tissue culture I did last time of the left lower leg showed multiple organisms without any predominating. In particular no Staphylococcus or group A strep were isolated. We sent her for venous reflux studies. She's had a previous left greater saphenous vein stripping and I think sclerotherapy of the right greater saphenous vein. She didn't really look at the lesser saphenous vein this both wounds are on the medial aspect. She has reflux in the common femoral vein and popliteal vein and an accessory vein on the right and the common femoral vein and popliteal vein on the left. I'm going to have her go to see vein and vascular just the look over things and see if anything else beside aggressive compression is indicated here. We have not been able to make any progress on these wounds in spite of the fact that the surface of the wounds is never look too bad. 11/20/17; no major change in the condition of the wounds. Patient reports a large amount of drainage. She has a lot of complaints of pain although enlisting her today I wonder if some of this at least his neuropathic rather than secondary to her wounds.  She has an appointment with vein and vascular on 12/30/17. The refractory nature of these wounds in my mind at least need vein and vascular to look over the wounds the recent reflux studies we did and her history to see if anything further can be done here. I also note her gait is deteriorated quite a bit. 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 She has a bilateral Trendelenburg gait. I wonder if this is neuropathic or perhaps multilevel radicular. 11/27/17; her wounds actually looks slightly better. Healthy-looking granulation tissue a scant amount of epithelialization. [place] [facility] will not pay for [employer]. They will play for tri layer Oasis and Dermagraft. This is not a diabetic ulcer. We'll try for the tri layer Oasis. She still complains of some drainage. She has a vein and vascular appointment on 12/30/17 12/04/17; the wounds visually look quite good. Healthy-looking granulation with some degree of epithelialization. We are still waiting for response to our request for trial to try layer Oasis. Her appointment with vascular to review venous and arterial issues isn't sold the end of July 7/31. Not allow debridement or even vigorous cleansing of the wound surface. 12/18/17; slightly smaller especially on the right. 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 We've been using Hydrofera Blue. We still are looking into triple layer Oasis through her insurance 01/08/18 on evaluation today patient's  wound actually appears to be showing signs of good improvement at this point in time. She has been tolerating the dressing changes without complication. Fortunately there does not appear to be any evidence of infection at this point in time. We have been utilizing silver nitrate which does seem to be of benefit for her which is also good news. Overall I'm very happy with how things seem to be both regards appearance as well as measurement. Patient did see Dr. Bridgett Larsson  for evaluation on 12/30/17. In his assessment he felt that stripping would not likely add much more than chronic compression to the patient's healing process. His recommendation was to follow-up in three months with Dr. Doren Custard if she hasn't healed in order to consider referral back to you and see vascular where she previously was in a trial and was able to get her wound to heal. I'll be see what she feels she when you staying compression and he reiterated this as well. 01/13/18 on evaluation today patient appears to actually be doing very well in regard to her bilateral medial malleolus ulcers. She seems to have tolerated the chemical cauterization with silver nitrate last week she did have some pain through that evening but fortunately states that I'll be see since it seems to be doing better she is overall pleased with the progress. 01/21/18; really quite a remarkable improvement since I've last seen these wounds. We started using silver nitrate specially on the islands of hyper granulation which for some reason her around the wound circumference. This is really done quite nicely. Primary dressing Hydrofera Blue under 4 layer compression. She seems to be able to hold out without a nurse rewrap. Follow-up in 1 week 01/28/18; we've continued the hydrofera blue but continued with chemical cauterization to the wound area that we started about a month ago for irregular hyper granulation. She is made almost stunning improvement in the overall wound dimensions. I was not really expecting this degree of improvement in these chronic wounds 02/05/18; we continue with Hydrofera Bluebut of also continued the aggressive chemical cauterization with silver nitrate. We made nice progress with the right greater than left wound. 02/12/18. We continued with Hydrofera Blue after aggressive chemical cauterization with silver nitrate. We appear to be making nice progress with both wound areas 02/19/2018; we continue with  [facility] after washing the wounds vigorously with Anasept spray and chemical cauterization with silver nitrate. We are making excellent progress. The area on the right's just about closed 02/26/2018. The area on the left medial ankle had too much necrotic debris today. I used a #5 curette we are able to get most of the soft. I continued with the silver nitrate to the much smaller wound on the right medial ankle she had a new area on her right lower pretibial area which she says was due to a role in her compression 03/05/2018; both wound areas look healthy. Not much change in dimensions from last week. I continue to use silver nitrate and Hydrofera Blue. The patient saw Dr. Doren Custard of vein and vascular. He felt she had venous stasis ulcers. He felt based on her previous arteriogram she should have adequate circulation for healing. Also she has deep venous reflux but really no significant correctable superficial venous reflux at this time. He felt we should continue with conservative management including leg elevation and compression 04/02/2018; since we last saw this woman about a month ago she had a fall apparently suffered a pelvic fracture. I did not look  up the x-ray. Nevertheless because of pain she literally was bedbound for 2 weeks and had home health coming out to change the dressing. Somewhat predictably this is resulted in considerable improvement in both wound areas. The right is just about closed on the medial malleolus and the left is about half the size. 04/16/2018; both her wounds continue to go down in size. Using Hydrofera Blue. 05/07/18; both her wounds appeared to be improving especially on the right where it is almost closed. We are using Hydrofera Blue 05/14/2018; slightly worse this week with larger wounds. Surface on the left medial not quite as good. We have been using Hydrofera Blue 05/21/18; again the wounds are slightly larger. Left medial malleolus slightly larger with  eschar around the circumference. We have been using Hydrofera Blue undergoing a wraps for a prolonged period of time. This got a lot better when she was more recumbent due to a fall and a back injury. I change the primary dressing the silver alginate today. She did not tolerate a 4 layer compression previously although I may need to bring this up with her next time 05/28/2018; area on the left medial malleolus again is slightly larger with more drainage. Area on the right is roughly unchanged. She has a small area of folliculitis on the right medial just on the lower calf. This does not look ominous. 06/03/2018 left medial malleolus slightly smaller in a better looking surface. We used silver nitrate on this last time with silver alginate. The area on the right appears slightly smaller 1/10; left medial malleolus slightly smaller. Small open area on the right. We used silver nitrate and silver alginate as of 2 weeks ago. We continue with the wound and compression. These got a lot better when she was off her feet 1/17; right medial malleolus wound is smaller. The left may be slightly smaller. Both surfaces look somewhat better. 1/24; both wounds are slightly smaller. Using silver alginate under Unna boots 1/31; both wounds appear smaller in fact the area on the right medial is just about closed. Surface eschar. We have been using silver alginate under Unna boots. The patient is less active now spends let much less time on her feet and I think this is contributed to the general improvement in the wound condition 2/7; both wounds appear smaller. I was hopeful the right medial would be closed however there there is still the same small open area. Slight amount of surface eschar on the left the dimensions are smaller there is eschar but the wound edges appear to be free. We have been using silver alginate under Unna boot's 2/14; both wounds once again measure smaller. Circumferential eschar on the left  medial. We have been using silver alginate under Unna boots with gradual improvement 2/21; the area on the right medial malleolus has healed. The area on the left is smaller. We have been using silver alginate and Unna boots. We can discharge wrapping the right leg she has 20/30 stockings at home she will need to protect the scar tissue in this area 2/28; the area on the right medial malleolus remains closed the patient has a compression stocking. The area on the left is smaller. We have been using silver alginate and Unna boots. 3/6 the area on the right medial ankle remains closed. Good edema control noted she is using her own compression stocking. The area on the left medial ankle is smaller. We have been managing this with silver alginate and Unna boots which we  will continue today. 3/13; the area on the right medial ankle remains closed and I'm declaring it healed today. When necessary the left is about the same still a healthy-looking surface but no major change and wound area. No evidence of infection and using silver alginate under unna and generally making considerable improvement 3/27 the area on the right medial ankle remains closed the area on the left is about the same as last week. Certainly not any worse we have been using silver alginate under an Unna boot 4/3; the area on the right medial ankle remains closed per the patient. We did not look at this wound. The wound on the left medial ankle is about the same surface looks healthy we have been using silver alginate under an Unna boot 4/10; area on the right medial ankle remains closed per the patient. We did not look at this wound. The wound on the left medial ankle is slightly larger. The patient complains that the [facility] caused burning pain all week. She also told us that she was a lot more active this week. Changed her back to silver alginate 4/17; right medial ankle still closed per the patient. Left medial ankle is  slightly larger. Using silver alginate. She did not tolerate Hydrofera Blue on this area 4/24; right medial ankle remains closed we have not look at this. The left medial ankle continues to get larger today by about a centimeter. We have been using silver alginate under Unna boots. She complains about 4 layer compression as an alternative. She has been up on her feet working on her garden 5/8; right medial ankle remains closed we did not look at this. The left medial ankle has increased in size about 100%. We have been using silver alginate under Unna boots. She noted increased pain this week and was not surprised that the wound is deteriorated 5/15; no major change in SA however much less erythema ( one week of doxy ocellulitis). 5/22-65 year old female returns at 1 week to the clinic for left medial ankle wound for which we have been using silver alginate under 3 layer compression She was placed on DOXY at last visit - the wound is wider at this visit. She is in 3 layer compression 5/29; change to [facility] last week. I had given her empiric doxycycline 2 weeks ago for a week. She is in 3 layer compression. She complains of a lot of pain and drainage on presentation today. 6/5; using Hydrofera Blue. I gave her doxycycline recently empirically for erythema and pain around the wound. Believe her cultures showed enterococcus which not would not have been well covered by doxycycline nevertheless the wound looks better and I don't feel specifically that the enterococcus needs to be covered. She has a new what looks like a wrap injury on her lateral left ankle. 6/12; she is using Hydrofera Blue. She has a new area on the left anterior lower tibial area. This was a wrap injury last week. 6/19; the patient is using Hydrofera Blue. She arrived with marked inflammation and erythema around the wound and tenderness. 12/01/18 on evaluation today patient appears to be doing a little bit better based on what  I'm hearing from the standpoint of lassos evaluation to this as far as the overall appearance of the wound is concerned. Then sometime substandard she typically sees Dr. Dellia Nims. Nonetheless overall very pleased with the progress that she's made up to this point. No fevers, chills, nausea, or vomiting noted at this time.  7/10; some improvement in the surface area. Aggressively debrided last week apparently. I went ahead with the debridement today although the patient does not tolerate this very well. We have been using Iodoflex. Still a fair amount of drainage 7/17; slightly smaller. Using Iodoflex. 7/24; no change from last week in terms of surface area. We have been using Iodoflex. Surface looks and continues to look somewhat better 7/31; surface area slightly smaller better looking surface. We have been using Iodoflex. This is under Unna boot compression 8/7-Patient presents at 1 week with Unna boot and Iodoflex, wound appears better 8/14-Patient presents at 1 week with Iodoflex, we use the Unna boot, wound appears to be stable better.Patient is getting Botox treatment for the inversion of the foot for tendon release, Next week 8/21; we are using Iodoflex. Unna boot. The wound is stable in terms of surface area. Under illumination there is some areas of the wound that appear to be either epithelialized or perhaps this is adherent slough at this point I was not really clear. It did not wipe off and I was reluctant to debride this today. 8/28; we are using Iodoflex in an Unna boot. Seems to be making good improvement. 9/4; using Iodoflex and wound is slightly smaller. 9/18; we are using Iodoflex with topical silver nitrate when she is here. The wound continues to be smaller 10/2; patient missed her appointment last week due to GI issues. She left and Iodoflex based dressing on for 2 weeks. Wound is about the same size about the size of a dime on the left medial lower 10/9 we have been using  Iodoflex on the medial left ankle wound. She has a new superficial probable wrap injury on the dorsal left ankle 10/16; we have been using Hydrofera Blue since last week. This is on the left medial ankle 10/23; we have been using Hydrofera Blue since 2 weeks ago. This is on the left medial ankle. Dimensions are better 11/6; using Hydrofera Blue. I think the wound is smaller but still not closed. Left medial ankle 11/13; we have been using Hydrofera Blue. Wound is certainly no smaller this week. Also the surface not as good. This is the remanent of a very large area on her left medial ankle. 11/20; using Sorbact since last week. Wound was about the same in terms of size although I was disappointed about the surface debris 12/11; 3-week follow-up. Patient was on vacation. Wound is measuring slightly larger we have been using Sorbact. 12/18; wound is about the same size however surface looks better last week after debridement. We have been using Sorbact under compression 1/15 wound is probably twice the size of last time increased in length nonviable surface. We have been using Sorbact. She was running a mild fever and missed her appointment last week 1/22; the wound is come down in size but under illumination still a very adherent debris we have been Hydrofera Blue that I changed her to last week 1/29; dimensions down slightly. We have been using Hydrofera Blue 2/19 dimensions are the same however there is rims of epithelialization under illumination. Therefore more the surface area may be epithelialized 2/26; the patient's wound actually measures smaller. The wound looks healthy. We have been using Hydrofera Blue. I had some thoughts about running Apligraf then I still may do that however this looks so much better this week we will delay that for now 3/5; the wound is small but about the same as last week. We have been using Hydrofera  Blue. No debridement is required today. 3/19; the wound is about  the size of a dime. Healthy looking wound even under illumination. We have been using Hydrofera Blue. No mechanical debridement is necessary 3/26; not much change from last week although still looks very healthy. We have been using Hydrofera Blue under Unna boots Patient was offered an ankle fusion by podiatry but not until the wound heals with a proceed with this. 4/9; the patient comes in today with her original wound on the medial ankle looking satisfactory however she has some uncontrolled swelling in the middle part of her leg with 2 new open areas superiorly just lateral to the tibia. I think this was probably a wrap issue. She said she felt uncomfortable during the week but did not call in. We have been using Hydrofera Blue 4/16; the wound on the medial ankle is about the same. She has innumerable small areas superior to this across her mid tibia. I think this is probably folliculitis. She is also been working in the yard doing a lot of sweating 4/30; the patient issue on the upper areas across her mid tibia of all healed. I think this was excessive yard work if I remember. Her wound on the medial ankle is smaller. Some debris on this we have been using Hydrofera Blue under Unna boots 5/7; mid tibia. She has been using Hydrofera Blue under an Unna wrap. She is apparently going for her ankle surgery on June 3 10/28/19-Patient returns to clinic with the ankle wound, we are using Hydrofera Blue under Unna wrap, surgery is scheduled for her left foot for June 3 so she will be back for nurse visit next week READMISSION 01/17/2020 Mrs. Latona is a 65 year old woman we have had in this clinic for a long period of time with severe venous hypertension and refractory wounds on her medial lower legs and ankles bilaterally. 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 When she was off  her legs for a prolonged period example when she fell and suffered a compression fracture things would heal up quite nicely. She is now retired and we managed to heal up the right medial leg wound. The left one was very tiny last time I saw this although still refractory. 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 It got to the point where this was interfering with her walking and she elected to proceed with a ankle arthrodesis to straighten her her ankle and leave her with a functional outcome for mobilization. The patient was referred to Dr. Doren Custard and really this took some time to arrange. Dr. Doren Custard saw her on 12/07/2019. Once again he verified that she had no arterial issues. She had previously had an angiogram several years ago. Follow-up ABIs on the left showed an ABI of 1.12 with triphasic waveforms and a TBI of 0.92. She is felt to have chronic deep venous insufficiency but I do not think it was felt that anything could be done from about this from an ablation point of view. At the time Dr. Doren Custard saw this patient the wounds actually look closed via the pictures in his clinic. The patient finally underwent her surgery on 12/15/2019. This went reasonably well and there was a good anatomic outcome. She developed a small distal wound dehiscence  on the lateral part of the surgical wound. However more problematically she is developed recurrence of the wound on the medial left ankle. There are actually 2 wounds here one in the distal lower leg and 1 pretty much at the level of the medial malleolus. It is a more distal area that is more problematic. She has been using Hydrofera Blue which started on Friday before this she was simply Ace wrapping. There was a culture done that showed Pseudomonas and she is on ciprofloxacin. A recent CNS on 8/11 was negative. The patient reports some pain but I generally think this is  improving. She is using a cam boot completely nonweightbearing using a walker for pivot transfers and a wheelchair 8/24; not much improvement unfortunately she has a surgical wound on the lateral part in the venous insufficiency wound medially. The bottom part of the medial insufficiency wound is still necrotic there is exposed tendon here. We have been using Hydrofera Blue under compression. Her edema control is however better 8/31; patient in for follow-up of his surgical wound on the lateral part of her left leg and chronic venous insufficiency ulcers medially. We put her back in compression last week. She comes in today with a complaint of 3 or 4 days worth of increasing pain. She felt her cam walker was rubbing on the area on the back of her heel. However there is intense erythema seems more likely she has cellulitis. She had 2 cultures done when she was seeing podiatry in the postop. One of them in late July showed Pseudomonas and she received a course of ciprofloxacin the other was negative on 8/11 she is allergic to penicillin with anaphylactoid complaints of hives oral swelling via information in epic 9/9; when I saw this patient last week she had intense anterior erythema around her wound on the right lateral heel and ankle and also into the right medial heel. Some of this was no doubt drainage and her walker boot however I was convinced she had cellulitis. I gave her Levaquin and Bactrim she is finishing up on this now. She is following up with Dr. [name] he saw her yesterday. He is taken her out of the walking boot of course she is still nonweightbearing. Her x-ray was negative for any worrisome features such as soft tissue air etc. Things are a lot better this week. She has home health. We have been using Hydrofera Blue under an [employer] which she put back on yesterday. I did not wrap her last week 9/17; her surrounding skin looks a lot better. In fact the area on the left lateral ankle  has just a scant amount of eschar. The only remaining wound is the large area on the left medial ankle. Probably about 60% of this is healthy granulation at the surface however she has a significant divot distally. This has adherent debris in it. I been using debridement and silver collagen to try and get this area to fill-in although I do not think we have made much progress this week 9/24; the patient's wound on the left medial ankle looks a lot better. The deeper divot area distally still requires debridement but this is cleaning up quite nicely we have been using silver collagen. The patient is complaining of swelling in her foot and is worried that that is contributing to the nonhealing of the ankle wound. She is also complaining of numbness in her anterior toes 10/4; left medial ankle. The small area distally still has a divot  with necrotic material that I have been debriding away. This has an undermining area. She is approved for Apligraf. She saw Dr. [name] her surgeon on 10/1. I think he declared himself is satisfied with the condition of things. Still nonweightbearing till the end of the month. 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 Her surgical wound is well closed. There is no evidence of infection 10/11; the patient arrived in clinic today with the expectation that we be able to put an Apligraf on this area after debridement however she arrives with a relatively large amount of green drainage on the dressing. The patient states that this started on Friday. She has not been systemically unwell. 10/19; culture I did last week showed both Enterococcus and Pseudomonas. I think this came in separate parts because I stopped her ciprofloxacin I gave her and prescribed her linezolid however now looking at the final culture result this is Pseudomonas which is resistant to quinolones. She has not yet picked up the linezolid apparently phone issues. We are also  trying to get a topical antibiotic out of [place] in [place] they can be applied by home health. She is still having green drainage 10/16; the patient has her topical antibiotic from [facility] in [place]. This is a compounded gel with vancomycin and ciprofloxacin and gentamicin. We are applying this on the wound bed with silver alginate over the top with Unna boot wraps. She arrives in clinic today with a lot less ominous looking drainage although she is only use this topical preparation once the second time today. She sees Dr. [name] her surgeon on Friday she has home health changing the dressing 11/2; still using her compounded topical antibiotic under silver alginate. Surface is cleaning up there is less drainage. We had an Apligraf for her today and I elected to apply it. A light coating of her antibiotic 04/25/2020 upon evaluation today patient appears to be doing well with regard to her ankle ulcer. There is a little bit of slough noted on the surface of the wound I am can have to perform sharp debridement to clear this away today. With that being said other than that fact overall I feel like she is making progress and we do see some new epithelial growth. There is also some improvement in the depth of the wound and that distal portion. There is little bit of slough there as well. 12/7; 2-week follow-up. Apligraf #3. Dimensions are smaller. Closing in especially inferiorly. Still some surface debris. Still using the [facility] topical antibiotic but I told her that I don't think this needs to be renewed 12/21; 2-week follow-up. Apligraf #4 dimensions are smaller. Nice improvement 06/05/2020; 2-week follow-up. The patient's wound on the left medial ankle looks really excellent. Nice granulation. Advancing epithelialization no undermining no evidence of infection. We would have to reapply for another Apligraf but with the condition of this wound I did not feel strongly about it. We  used Hydrofera Blue under the same degree of compression. She follows up with Dr. [name] her surgeon a week Friday 06/13/2020 upon evaluation today patient appears to be doing excellent in regard to her wound. She has been tolerating the dressing changes without complication. Fortunately there is no signs of active infection at this time. No fevers, chills, nausea, vomiting, or diarrhea. She was using Hydrofera Blue last week. 06/20/2020 06/20/2020 on evaluation today patient actually appears to be doing excellent in regard to her wound. This is measuring better  and looking much better as well. She has been using the collagen that seems to be doing better for her as well even though the [facility] was and is not sticking or feeling as rough on her wound. She did see Dr. [name] on Friday he is very pleased he also stated none of the hardware has shifted. That is great news 1/27; the patient has a small clean wound all that is remaining. I agree that this is too small to really consider an Apligraf. Under illumination the surface is looking quite good. We have been using collagen although the dimensions are not any better this week 2/2; the patient has a small clean wound on the left medial ankle. Although this left of her substantial original areas. Measurements are smaller. We have been using polymen Ag under an Haematologist. 2/10; small area on the left medial ankle. This looks clean nothing to debride however dimensions are about the same we have been using polymen I think now for 2 weeks 2/17; not much change in surface area. We have been using polymen Ag without any improvement. 3/17; 1 month follow-up. The patient has been using endoform without any improvement in fact I think this looks worse with more depth and more expansion 3/24; no improvement. Perhaps less debris on the surface. We have been using Sorbact for 1 week 4/4; wound measures larger. She has edema in her leg and her foot which she  tells as her wrap came down. We have been using Unna boots. Sorbact of the wound. She has been approved for Apligraf 09/12/2020 upon evaluation today patient appears to be doing well with regard to her wound. We did get the Apligraf reapproved which is great news we have that available for application today. Fortunately there is no signs of infection and overall the patient seems to be doing great. The wound bed is nice and clean. 4/27; patient presents for her second application of Apligraf. She states over the past week she has been on her feet more often due to being outside in her garden. She has noted more swelling to her foot as a result. She denies increased warmth, pain or erythema to the wound site. 10/10/2020 upon evaluation today patient appears to be doing well with regard to her wound which does not appear to be quite as irritated as last week from what I am hearing. With that being said unfortunately she is having issues with some erythema and warmth to touch as well as an increase size. I do believe this likely is infected. 10/17/2020 upon evaluation today patient appears to be doing excellent in regard to her wound this is significantly improved as compared to last week. Fortunately I think that the infection is much better controlled at this point. She did have evidence of both Enterococcus as well as Staphylococcus noted on culture. Enterococcus really would not be helped significantly by the Cipro but the wound is doing so much better I am under the assumption that the Staphylococcus is probably the main organism that is causing the current infection. Nonetheless I think that she is doing excellent as far as that is concerned and I am very pleased in that regard. I would therefore recommend she continue with the Cipro. 10/31/2020 upon evaluation today patient appears to be doing well with regard to her wound. She has been tolerating the dressing changes without complication. Fortunately  there is no signs of active infection and overall I am extremely pleased with where things  stand today. No fevers, chills, nausea, vomiting, or diarrhea. With that being said she does have some green drainage coming from the wound and although it looks okay I am a little concerned about the possibility of a continuing infection. Specifically with Pseudomonas. For that reason I will go ahead and send in a prescription for Cipro for her to be continued. 11/14/2020 upon evaluation today patient appears to be doing very well currently in regard to her wound on her leg. She has been tolerating the dressing changes without complication. Fortunately I feel like the infection is finally under good control here. Unfortunately we do not have the Apligraf for application today although we can definitely order to have it in place for next week. That will be her fifth and final of the current series. Nonetheless I feel like her wound is really doing quite well which is great news. 11/21/2020 upon evaluation today patient appears to be doing well with regard to her wound on the medial ankle. Fortunately I think the infection is under control and I do believe we can go ahead and reapply the Apligraf today. She is in agreement with that plan. There does not appear to be any signs of active infection at this time which is great news. No fevers, chills, nausea, vomiting, or diarrhea. 12/05/2020 upon evaluation today patient's wound bed actually showed signs of good granulation epithelization at this point. There does not appear to be any signs of infection which is great news and overall very pleased with where things stand. Overall the patient seems to be doing fairly well in my opinion with regard to her wound although I do believe she continues to build up a lot of biofilm I think she could benefit from using PuraPly at this point. 12/12/2020 upon evaluation today patient's wound actually appears to be doing decently well  today. The Unna boot has not been quite as well-tolerated so that more uncomfortable for her and even causing some pressure over the plate on the lateral portion of her foot which is 90 where the wound is. There did not appear to be any significant deep tissue injury with that there may be a minimal change in the skin noted I think that we may want to go back to the Coflex 2 layer which is a little bit easier on her skin it seems. 12/19/2020 upon evaluation today patient actually seems to be making great progress with the PuraPly currently. She in fact seems to be much better as far as the overall appearance of the wound bed is concerned I am very happy in this regard. I do not see any signs of of infection which is great news as well. No fevers, chills, nausea, vomiting, or diarrhea. 12/26/2020 upon evaluation today patient appears to actually be doing better in regard to her wound on the left medial ankle region. The surface of the wound is actually doing significantly better which is great news. There does not appear to be any signs of infection which is also great news and in general I am extremely pleased with where we stand today. 01/02/2021 upon evaluation today patient appears to be doing well with regard to her wound. In fact this is showing signs of excellent improvement and very pleased with where things stand. In fact the last 3 appointments have all shown signs of this getting smaller which is excellent news. I have not even had to perform any debridement and today is no exception. Overall I feel like this  is dramatically improved compared to previous. T oday is PuraPly application #4. 1/76/1607 upon evaluation today patient appears to be doing excellent in regard to her wound this is continue to show signs of improvement and overall I am extremely pleased with where we stand today. She is actually here for PuraPly application #5. Every time we have applied this she is noted  definite improvement on measurements. 01/23/2021 upon evaluation today patient is actually making good progress in regard to her wound. This was actually on just a little bit longer this time compared to previous due to the fact that she did have to go out of town. She is actually here for PuraPly application #6. We have definitely been seeing improvements in the overall quality of the tissue on the surface of the wound which is awesome news. In general I think that the patient seems to be continuing to make great progress here. 01/30/2021 upon evaluation today patient's wound is actually doing excellent. There is really not any significant biofilm buildup which is great news and overall I am extremely pleased with where things stand today. There does not appear to be any signs of active infection. No fevers, chills, nausea, vomiting, or diarrhea. 02/06/2021 upon evaluation today patient's wound is actually showing signs of excellent improvement which is great news. We continue to see the benefit of the PuraPly this is doing a great job the wound seems not really irritated whatsoever and is showing signs of good granulation at this point. Overall I am extremely happy with what we are seeing. The patient likewise is happy to hear all of this as well. 02/13/2021 upon evaluation today patient appears to be doing well with regard to her wound. This again is measuring smaller today and I am very pleased in this regard. Fortunately there does not appear to be any signs of active infection at this time which is good news from a systemic standpoint. Locally there is still some significant drainage which she does have is concerned about infection locally. No fevers, chills, nausea, vomiting, or diarrhea. 02/20/2021 upon evaluation today patient actually appears to be doing decently well in regard to her wound. She has been tolerating the dressing changes without complication. I do believe the PuraPly is helping  wound bed does appear to be doing better. There is no evidence of active infection locally or systemically at this point visually although on fluorescence imaging there still appear to be bacterial load and bioburden noted. 02/27/2021 upon evaluation today patient fortunately continues to show signs of improvement with use of the PuraPly currently. Subsequently we did review her culture results and to be honest I think that the Bactrim is probably the best option to have her continue at this point. For that reason I am get a go ahead and send in a refill today for her for an additional 2 weeks. Nonetheless I think that we are making excellent progress here. It was Enterococcus and Staphylococcus that were noted she is allergic to penicillin so there is not much I can do from the Enterococcus standpoint though the staff does seem to be sensitive to the Bactrim. 03/06/2021 upon evaluation today patient appears to be doing well with regard to her wound. This is definitely showing signs of improvement which is great news and overall I am extremely pleased with where things stand today. There does not appear to be any signs of active infection which is great news and overall and I do believe that we are headed  in the appropriate direction I think the PuraPly is doing an awesome job for her. 03/20/2021 upon evaluation today patient actually appears to be doing well with regard to her wound. We have been using the PuraPly although last week when I was out of the office they actually just used endoform nonetheless this still seems to be doing great. Fortunately there does not appear to be any signs of active infection which is great news overall I think the patient is making wonderful progress. 03/27/2021 upon evaluation today patient appears to be doing well with regard to her wound in fact there is a little drainage but otherwise her does not appear to be any major signs of open wounds nor infection at this  time. Overall I am extremely pleased with where things stand. No fevers, chills, nausea, vomiting, or diarrhea. 04/03/2021 upon evaluation today patient presents for reevaluation here in the clinic and overall she seems to be doing quite well. Fortunately there does not appear to be any signs of active infection which is great news and in general I am very pleased with how things appear. There is still a very small opening remaining but in general she is looking much better. Fortunately I do not think there is any need for the PuraPly at this point. Objective Constitutional Well-nourished and well-hydrated in no acute distress. Vitals Time Taken: 10:30 AM, Height: 68 in, Temperature: 97.9 F, Pulse: 57 bpm, Respiratory Rate: 20 breaths/min, Blood Pressure: 123/68 mmHg. Respiratory normal breathing without difficulty. Psychiatric this patient is able to make decisions and demonstrates good insight into disease process. Alert and Oriented x 3. pleasant and cooperative. General Notes: Upon inspection patient's wound bed actually showed signs of good granulation epithelization at this point. Fortunately there does not appear to be any signs of infection I did perform a light cleaning with the 3 mm curette in order to clear away some of the dry skin around the edges of the wound. She tolerated that today without complication and postdebridement the wound bed appears to be doing much better which is great news. Integumentary (Hair, Skin) Wound #15 status is Open. Original cause of wound was Gradually Appeared. The date acquired was: 12/30/2019. The wound has been in treatment 63 weeks. The wound is located on the Left,Medial Malleolus. The wound measures 0.2cm length x 0.2cm width x 0.1cm depth; 0.031cm^2 area and 0.003cm^3 volume. There is Fat Layer (Subcutaneous Tissue) exposed. There is no tunneling or undermining noted. There is a small amount of serosanguineous drainage noted. The wound margin is  distinct with the outline attached to the wound base. There is small (1-33%) red granulation within the wound bed. There is a large (67-100%) amount of necrotic tissue within the wound bed including Adherent Slough. 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 There was a Excisional Skin/Subcutaneous Tissue Debridement with a total area of 1 sq cm performed by [name], PA. With the following instrument(s): Curette to remove Viable and Non-Viable tissue/material. Material removed includes Subcutaneous Tissue, Slough, Skin: Dermis, and Skin: Epidermis after achieving pain control using Lidocaine 5% topical ointment. A time out was conducted at 11:12, prior to  the start of the procedure. There was no bleeding. The procedure was tolerated well with a pain level of 0 throughout and a pain level of 0 following the procedure. Post Debridement Measurements: 0.2cm length x 0.2cm width x 0.1cm depth; 0.003cm^3 volume. Character of Wound/Ulcer Post Debridement is stable. Severity of Tissue Post Debridement is: Fat layer exposed. Post procedure Diagnosis Wound #15: Same as Pre-Procedure Pre-procedure diagnosis of Wound #15 is a Venous Leg Ulcer located on the Left,Medial Malleolus . There was a Double Layer Compression Therapy Procedure by Deon Pilling, RN. Post procedure Diagnosis Wound #15: Same as Pre-Procedure Plan Follow-up Appointments: Return Appointment in 1 week. Margarita Grizzle Bring in compression stocking to left leg in each week. Bathing/ Shower/ Hygiene: May shower with  protection but do not get wound dressing(s) wet. Edema Control - Lymphedema / SCD / Other: Elevate legs to the level of the heart or above for 30 minutes daily and/or when sitting, a frequency of: - throughout the day Avoid standing for long periods of time. Exercise regularly Compression stocking or Garment 20-30 mm/Hg pressure to: - right leg daily Additional Orders / Instructions: Follow Nutritious Diet WOUND #15: - Malleolus Wound Laterality: Left, Medial Cleanser: Soap and Water 1 x Per Week/30 Days Discharge Instructions: May shower and wash wound with dial antibacterial soap and water prior to dressing change. Peri-Wound Care: Triamcinolone 15 (g) 1 x Per Week/30 Days Discharge Instructions: Use triamcinolone 15 (g) as directed Peri-Wound Care: Sween Lotion (Moisturizing lotion) 1 x Per Week/30 Days Discharge Instructions: Apply moisturizing lotion as directed Prim Dressing: Promogran Prisma Matrix, 4.34 (sq in) (silver collagen) 1 x Per Week/30 Days ary Discharge Instructions: Moisten collagen with saline or hydrogel Prim Dressing: adaptic 1 x Per Week/30 Days ary Discharge Instructions: over the Prisma. Secondary Dressing: Woven Gauze Sponge, Non-Sterile 4x4 in 1 x Per Week/30 Days Discharge Instructions: to bolster skin sub Secondary Dressing: ABD Pad, 5x9 1 x Per Week/30 Days Discharge Instructions: Apply over primary dressing and lateral ankle Secondary Dressing: Optifoam Non-Adhesive Dressing, 4x4 in 1 x Per Week/30 Days Discharge Instructions: Apply over primary dressing pad lateral foot with foam donut Com pression Wrap: CoFlex TLC XL 2-layer Compression System 4x7 (in/yd) 1 x Per Week/30 Days Discharge Instructions: Apply CoFlex 2-layer compression as directed. (alt for 4 layer) 1. Would recommend currently that we going continue with the wound care measures as before and the patient is in agreement with the plan. This includes the use of the compression wrap which I  think is doing an awesome job for her. 2. With regard to the medication we can use a little bit of silver collagen followed by the Adaptic which has done well for her in the past the alginate seem to rub a little bit she tells me. We will see patient back for reevaluation in 1 week here in the clinic. If anything worsens or changes patient will contact our office for additional recommendations. Electronic Signature(s) Signed: 04/03/2021 3:40:35 PM By: [name] PA-C Entered By: [name] on 04/03/2021 15:40:35 -------------------------------------------------------------------------------- SuperBill Details Patient Name: Date of Service: [facility] [name], [name] 04/03/2021 Medical Record Number: [id number] Patient Account Number: [id number] Date of Birth/Sex: Treating RN: [id number] (65 y.o. [name] Primary Care Provider: [name] Other Clinician: Referring Provider: Treating Provider/Extender: [name] in Treatment: 59 Diagnosis Coding ICD-10 Codes Code Description [phone] Chronic venous hypertension (idiopathic) with ulcer and inflammation of left lower extremity L97.828 Non-pressure chronic  ulcer of other part of left lower leg with other specified severity L97.328 Non-pressure chronic ulcer of left ankle with other specified severity Facility Procedures CPT4 Code: [id number] Description: 94585 - DEB SUBQ TISSUE 20 SQ CM/< ICD-10 Diagnosis Description L97.828 Non-pressure chronic ulcer of other part of left lower leg with other specified Modifier: severity Quantity: 1 Physician Procedures Electronic Signature(s) Signed: 04/03/2021 3:40:52 PM By: [name] PA-C Entered By: [name] on 04/03/2021 15:40:52

## 2021-04-04 NOTE — Progress Notes (Signed)
[name], [name] ([id number]) Visit Report for 04/03/2021 Arrival Information Details Patient Name: Date of Service: [name] [name], [name] 04/03/2021 10:00 A M Medical Record Number: [id number] Patient Account Number: [id number] Date of Birth/Sex: Treating RN: 1956-06-01 (65 y.o. [name] Chaylee Ehrsam: [name] Other Clinician: Referring Abrahan Fulmore: Treating Demoni Parmar/Extender: [name] in Treatment: 58 Visit Information History Since Last Visit Added or deleted any medications: No Patient Arrived: Cane Any new allergies or adverse reactions: No Arrival Time: 10:28 Had a fall or experienced change in No Accompanied By: self activities of daily living that may affect Transfer Assistance: None risk of falls: Patient Identification Verified: Yes Signs or symptoms of abuse/neglect since last visito No Secondary Verification Process Completed: Yes Hospitalized since last visit: No Patient Requires Transmission-Based Precautions: No Implantable device outside of the clinic excluding No Patient Has Alerts: Yes cellular tissue based products placed in the center Patient Alerts: L ABI =1.12, TBI = .92 since last visit: R ABI= 1.02, TBI= .58 Has Dressing in Place as Prescribed: Yes Pain Present Now: No Electronic Signature(s) Signed: 04/04/2021 10:21:29 AM By: [name] Entered By: [name] on 04/03/2021 10:29:06 -------------------------------------------------------------------------------- Compression Therapy Details Patient Name: Date of Service: [name], [name] 04/03/2021 10:00 A M Medical Record Number: [id number] Patient Account Number: [id number] Date of Birth/Sex: Treating RN: 11-Jun-1955 (65 y.o. [name] Primary Care Jayelyn Barno: [name] Other Clinician: Referring Teneshia Hedeen: Treating [name]/Extender: [name] in Treatment: 80 Compression Therapy Performed for Wound  Assessment: Wound #15 Left,Medial Malleolus Performed By: Clinician Deon Pilling, RN Compression Type: Double Layer Post Procedure Diagnosis Same as Pre-procedure Electronic Signature(s) Signed: 04/04/2021 6:11:26 PM By: Deon Pilling RN, BSN Entered By: Deon Pilling on 04/03/2021 11:21:16 -------------------------------------------------------------------------------- Encounter Discharge Information Details Patient Name: Date of Service: [name], [name] 04/03/2021 10:00 [place] Record Number: [id number] Patient Account Number: [id number] Date of Birth/Sex: Treating RN: Oct 07, 1955 (65 y.o. [name] Primary Care Stephania Macfarlane: [name] Other Clinician: Referring Tremon Sainvil: Treating [name]/Extender: [name] in Treatment: 31 Encounter Discharge Information Items Post Procedure Vitals Discharge Condition: Stable Temperature (F): 97.9 Ambulatory Status: Cane Pulse (bpm): 57 Discharge Destination: Home Respiratory Rate (breaths/min): 20 Transportation: Private Auto Blood Pressure (mmHg): 123/68 Accompanied By: self Schedule Follow-up Appointment: Yes Clinical Summary of Care: Electronic Signature(s) Signed: 04/04/2021 6:11:26 PM By: Deon Pilling RN, BSN Entered By: Deon Pilling on 04/03/2021 11:24:02 -------------------------------------------------------------------------------- Lower Extremity Assessment Details Patient Name: Date of Service: [name], [name] 04/03/2021 10:00 A M Medical Record Number: [id number] Patient Account Number: [id number] Date of Birth/Sex: Treating RN: 05/04/1956 (65 y.o. [name] Primary Care Kristeena Meineke: [name] Other Clinician: Referring [name]: Treating [name]/Extender: [name] in Treatment: 14 Edema Assessment Assessed: [Left: Yes] [Right: No] Edema: [Left: Ye] [Right: s] Calf Left: Right: Point of Measurement: 31 cm From Medial Instep 24  cm Ankle Left: Right: Point of Measurement: 11 cm From Medial Instep 21 cm Vascular Assessment Pulses: Dorsalis Pedis Palpable: [Left:Yes] Electronic Signature(s) Signed: 04/04/2021 6:11:26 PM By: Deon Pilling RN, BSN Entered By: Deon Pilling on 04/03/2021 10:45:09 -------------------------------------------------------------------------------- [place] Details Patient Name: Date of Service: [name], [name] 04/03/2021 10:00 [place] Record Number: [id number] Patient Account Number: [id number] Date of Birth/Sex: Treating RN: 02-14-1956 (65 y.o. [name] Primary Care [name]: [name] Other Clinician: Referring [name]:  Treating [name]/Extender: [name] Weeks in Treatment: 80 Multidisciplinary Care Plan reviewed with physician Active Inactive Venous Leg Ulcer Nursing Diagnoses: Actual venous Insuffiency (use after diagnosis is confirmed) Knowledge deficit related to disease process and management Goals: Patient will maintain optimal edema control Date Initiated: 06/20/2020 Target Resolution Date: 04/24/2021 Goal Status: Active Interventions: Assess peripheral edema status every visit. Compression as ordered Treatment Activities: Therapeutic compression applied : 06/20/2020 Notes: 01/30/21: Edema control ongoing, using CoFlex wrap. Wound/Skin Impairment Nursing Diagnoses: Impaired tissue integrity Knowledge deficit related to ulceration/compromised skin integrity Goals: Patient/caregiver will verbalize understanding of skin care regimen Date Initiated: 01/17/2020 Target Resolution Date: 04/24/2021 Goal Status: Active Ulcer/skin breakdown will have a volume reduction of 30% by week 4 Date Initiated: 01/17/2020 Date Inactivated: 03/12/2020 Target Resolution Date: 03/09/2020 Goal Status: Met Interventions: Assess patient/caregiver ability to obtain necessary supplies Assess patient/caregiver ability to  perform ulcer/skin care regimen upon admission and as needed Assess ulceration(s) every visit Provide education on ulcer and skin care Notes: 01/30/21: Wound care regimen continues, PuraPly skin sub being used. Electronic Signature(s) Signed: 04/04/2021 6:11:26 PM By: Deon Pilling RN, BSN Entered By: Deon Pilling on 04/03/2021 10:47:25 -------------------------------------------------------------------------------- Pain Assessment Details Patient Name: Date of Service: [name], [name] 04/03/2021 10:00 A M Medical Record Number: [id number] Patient Account Number: [id number] Date of Birth/Sex: Treating RN: 1956/01/24 (65 y.o. [name] Maryetta Shafer: [name] Other Clinician: Referring Joda Braatz: Treating Tauno Falotico/Extender: [name] in Treatment: 41 Active Problems Location of Pain Severity and Description of Pain Patient Has Paino No Site Locations Pain Management and Medication Current Pain Management: Electronic Signature(s) Signed: 04/03/2021 5:50:27 PM By: Baruch Gouty RN, BSN Signed: 04/04/2021 10:21:29 AM By: [name] Entered By: [name] on 04/03/2021 10:30:54 -------------------------------------------------------------------------------- Patient/Caregiver Education Details Patient Name: Date of Service: [name], [name] 11/2/2022andnbsp10:00 A M Medical Record Number: [id number] Patient Account Number: [id number] Date of Birth/Gender: Treating RN: February 12, 1956 (65 y.o. [name] Primary Care Physician: [name] Other Clinician: Referring Physician: Treating Physician/Extender: [name] in Treatment: 40 Education Assessment Education Provided To: Patient Education Topics Provided Wound/Skin Impairment: Handouts: Skin Care Do's and Dont's Methods: Explain/Verbal Responses: Reinforcements needed Electronic Signature(s) Signed: 04/04/2021 6:11:26 PM  By: Deon Pilling RN, BSN Entered By: Deon Pilling on 04/03/2021 10:48:03 -------------------------------------------------------------------------------- Wound Assessment Details Patient Name: Date of Service: [name], [name] 04/03/2021 10:00 A M Medical Record Number: [id number] Patient Account Number: [id number] Date of Birth/Sex: Treating RN: 08/01/1955 (65 y.o. [name] Marikay Roads: [name] Other Clinician: Referring Kadesia Robel: Treating Kaly Mcquary/Extender: [name] in Treatment: 19 Wound Status Wound Number: 15 Primary Venous Leg Ulcer Etiology: Wound Location: Left, Medial Malleolus Wound Status: Open Wounding Event: Gradually Appeared Comorbid Peripheral Venous Disease, Osteoarthritis, Confinement Date Acquired: 12/30/2019 History: Anxiety Weeks Of Treatment: 63 Clustered Wound: No Photos Wound Measurements Length: (cm) 0.2 Width: (cm) 0.2 Depth: (cm) 0.1 Area: (cm) 0.031 Volume: (cm) 0.003 % Reduction in Area: 99.7% % Reduction in Volume: 100% Epithelialization: Large (67-100%) Tunneling: No Undermining: No Wound Description Classification: Full Thickness With Exposed Support Structures Wound Margin: Distinct, outline attached Exudate Amount: Small Exudate Type: Serosanguineous Exudate Color: red, brown Foul [name] After Cleansing: No Slough/Fibrino Yes Wound Bed Granulation Amount: Small (1-33%) Exposed Structure Granulation Quality: Red Fascia Exposed: No Necrotic Amount: Large (67-100%) Fat Layer (Subcutaneous Tissue) Exposed: Yes Necrotic Quality: Adherent Slough Tendon Exposed:  No Muscle Exposed: No Joint Exposed: No Bone Exposed: No Treatment Notes Wound #15 (Malleolus) Wound Laterality: Left, Medial Cleanser Soap and Water Discharge Instruction: May shower and wash wound with dial antibacterial soap and water prior to dressing change. Peri-Wound Care Triamcinolone 15 (g) Discharge  Instruction: Use triamcinolone 15 (g) as directed Sween Lotion (Moisturizing lotion) Discharge Instruction: Apply moisturizing lotion as directed Topical Primary Dressing Promogran Prisma Matrix, 4.34 (sq in) (silver collagen) Discharge Instruction: Moisten collagen with saline or hydrogel adaptic Discharge Instruction: over the Prisma. Secondary Dressing Woven Gauze Sponge, Non-Sterile 4x4 in Discharge Instruction: to bolster skin sub ABD Pad, 5x9 Discharge Instruction: Apply over primary dressing and lateral ankle Optifoam Non-Adhesive Dressing, 4x4 in Discharge Instruction: Apply over primary dressing pad lateral foot with foam donut Secured With Compression Wrap CoFlex TLC XL 2-layer Compression System 4x7 (in/yd) Discharge Instruction: Apply CoFlex 2-layer compression as directed. (alt for 4 layer) Compression Stockings Add-Ons Electronic Signature(s) Signed: 04/03/2021 5:50:27 PM By: Baruch Gouty RN, BSN Signed: 04/04/2021 6:11:26 PM By: Deon Pilling RN, BSN Entered By: Deon Pilling on 04/03/2021 10:46:19 -------------------------------------------------------------------------------- Vitals Details Patient Name: Date of Service: [name], [name] 04/03/2021 10:00 [place] Record Number: [id number] Patient Account Number: [id number] Date of Birth/Sex: Treating RN: [birth year] (65 y.o. [name] Littie Chiem: [name] Other Clinician: Referring Kemaria Dedic: Treating Buel Molder/Extender: [name] in Treatment: 28 Vital Signs Time Taken: 10:30 Temperature (F): 97.9 Height (in): 68 Pulse (bpm): 57 Respiratory Rate (breaths/min): 20 Blood Pressure (mmHg): 123/68 Reference Range: 80 - 120 mg / dl Electronic Signature(s) Signed: 04/04/2021 10:21:29 AM By: [name] Entered By: [name] on 04/03/2021 10:30:36

## 2021-04-05 ENCOUNTER — Telehealth: Payer: Self-pay | Admitting: Podiatry

## 2021-04-05 NOTE — Telephone Encounter (Signed)
Patient called stating she had lab work done and would like the results. Please call pt at [phone].

## 2021-04-07 MED ORDER — TERBINAFINE HCL 250 MG PO TABS: 250.0000 mg | ORAL_TABLET | Freq: Every day | ORAL | 0 refills | Status: DC

## 2021-04-07 NOTE — Telephone Encounter (Signed)
Rx Lamisil sent to pharmacy. Spoke with patient on the phone.  Reviewed the hepatic function panel.  Within normal limits.

## 2021-04-07 NOTE — Progress Notes (Signed)
   Subjective:  Patient presents today status post tibiotalar calcaneal arthrodesis left lower extremity. DOS: 12/15/2019.  Patient continues to be managed at the wound care center for the ulcers that have developed to the ankle.  Overall the patient states that she is doing well.  She does have some chronic ankle instability and would like to know about different bracing options to help stabilize the foot.  Patient also experiences thickened elongated nails to the bilateral feet.  She would like to discuss different treatment options for toenail fungus.  Past Medical History:  Diagnosis Date   Allergy    Ankle wound 02/2016   bilateral   Dental bridge present    lower   Dental crowns present    upper right   Open wounds involving multiple regions of lower extremity    Peripheral vascular disease ([place])    venous stasis ulcers bilateral lower extremities   PONV (postoperative nausea and vomiting)    Objective/Physical Exam Neurovascular status intact.  The rearfoot is in a rectus position with good alignment of the foot in relationship to the leg.  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  From the midtarsal joint distal, noted forefoot adductus deformity.  Ankle joint almost 90 degrees.  There is some palpable orthopedic hardware noted to the lateral aspect of the calcaneus.  Mild sensitivity to the area.  No open wounds noted.  Hyperkeratotic dystrophic onychomycotic nails also noted 1-5 bilateral  Radiographic exam LT foot and ankle 12/21/2020: Orthopedic hardware appears intact and stable.  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  Patient has now over 3 months postoperative and radiographically appears stable and healing well.  No significant change since last x-rays  Assessment: 1. s/p tibiotalar calcaneal arthrodesis left. DOS: 12/15/2019 2.  Postsurgical edema left-mostly resolved 3.  Ulcer right medial ankle secondary to venous insufficiency 4.   History of recurrent ulcers secondary to venous insufficiency 5.  Onychomycosis of toenails bilateral  Plan of Care:  1. Patient was evaluated. 2.  Continue management at the wound care center for the ulcer of the ankle 3.  Today were going to make an appointment with the Pedorthist for custom molded AFO to help support and stabilize the foot and ankle 4.  Prescription for Lyrica 75 mg 2 times daily.  Patient experiences nausea and vomiting with the gabapentin 5.  Order placed for hepatic function panel.  If this is within normal limits we will prescribe Lamisil 2 and 50 mg #90 6.  Return to clinic 3 months   Edrick Kins, DPM [facility]  Dr. Edrick Kins, DPM    [address], [place] [address]                Office [phone]  Fax [phone]

## 2021-04-10 ENCOUNTER — Other Ambulatory Visit: Payer: Self-pay

## 2021-04-10 ENCOUNTER — Encounter (HOSPITAL_BASED_OUTPATIENT_CLINIC_OR_DEPARTMENT_OTHER): Payer: Medicare Other | Admitting: Physician Assistant

## 2021-04-10 DIAGNOSIS — I87332 Chronic venous hypertension (idiopathic) with ulcer and inflammation of left lower extremity: Secondary | ICD-10-CM | POA: Diagnosis not present

## 2021-04-10 NOTE — Progress Notes (Addendum)
[name], [name] ([id number]) Visit Report for 04/10/2021 Chief Complaint Document Details Patient Name: Date of Service: [name], [name] 04/10/2021 9:45 A M Medical Record Number: [id number] Patient Account Number: [id number] Date of Birth/Sex: Treating RN: 11/24/1955 (65 y.o. [name] Provider: [name] Other Clinician: Referring Provider: Treating Provider/Extender: [name] in Treatment: 79 Information Obtained from: Patient Chief Complaint patient is been followed long-term in this clinic for venous insufficiency ulcers with inflammation, hypertension and ulceration over the medial ankle bilaterally. 01/17/2020; this is a patient who is here for review of postoperative wounds on the left lateral ankle and recurrence of venous stasis ulceration on the left medial Electronic Signature(s) Signed: 04/10/2021 10:29:53 AM By: [name] PA-C Entered By: [name] on 04/10/2021 10:29:52 -------------------------------------------------------------------------------- HPI Details Patient Name: Date of Service: [name], [name]. 04/10/2021 9:45 A M Medical Record Number: [id number] Patient Account Number: [id number] Date of Birth/Sex: Treating RN: January 20, 1956 (65 y.o. [name] Provider: [name] Other Clinician: Referring Provider: Treating Provider/Extender: [name] in Treatment: 34 History of Present Illness HPI Description: the remaining wound is over the left medial ankle. Similar wound over the right medial ankle healed largely with use of Apligraf. Most recently we have been using Hydrofera Blue over this wound with considerable improvement. The patient has been extensively worked up in the past for her venous insufficiency and she is not a candidate for antireflux surgery although I have none of the details available currently. 08/24/14; considerable  improvement today. About 50% of this wound areas now epithelialized. The base of the wound appears to be healthier granulation.as opposed to last week when she had deteriorated a considerable improvement 08/17/14; unfortunately the wound has regressed somewhat. The areas of epithelialization from the superior aspect are not nearly as healthy as they were last week. The patient thinks her Hydrofera Blue slipped. 09/07/14; unfortunately the area has markedly regressed in the 2 weeks since I've seen this. There is an odor surrounding erythema. The healthy granulation tissue that we had at the base of the wound now is a dusky color. The nurse reports green drainage 09/14/14; the area looks somewhat better than last week. There is less erythema and less drainage. The culture I did did not show any growth. Nevertheless I think it is better to continue the Cipro and doxycycline for a further week. The remaining wound area was debridement. 09/21/14. Wound did not require debridement last week. Still less erythema and less drainage. She can complete her antibiotics. The areas of epithelialization in the superior aspect of the wound do not look as healthy as they did some weeks ago 10/05/14 continued improvement in the condition of this wound. There is advancing epithelialization. Less aggressive debridement required 10/19/14 continued improvement in the condition and volume of this wound. Less aggressive debridement to the inferior part of this to remove surface slough and fibrinous eschar 11/02/14 no debridement is required. The surface granulation appears healthy although some of her islands of epithelialization seem to have regressed. No evidence of infection 11/16/14; lites surface debridement done of surface eschar. The wound does not look to be unhealthy. No evidence of infection. Unfortunately the patient has had podiatry issues in the right foot and for some reason has redeveloped small surface ulcerations in  the medial right ankle. Her original presentation involved wounds in this area 11/23/14 no debridement. The area on the  right ankle has enlarged. The left ankle wound appears stable in terms of the surface although there is periwound inflammation. There has been regression in the amount of new skin 11/30/14 no debridement. Both wound areas appear healthy. There was no evidence of infection. The the new area on the right medial ankle has enlarged although that both the surfaces appear to be stable. 12/07/14; Debridement of the right medial ankle wound. No no debridement was done on the left. 12/14/14 no major change in and now bilateral medial ankle wounds. Both of these are very painful but the no overt evidence of infection. She has had previous venous ablation 12/21/14; patient states that her right medial ankle wound is considerably more painful last week than usual. Her left is also somewhat painful. She could not tolerate debridement. The right medial ankle wound has fibrinous surface eschar 12/28/14 this is a patient with severe bilateral venous insufficiency ulcers. For a considerable period of time we actually had the one on the right medial ankle healed however this recently opened up again in June. The left medial ankle wound has been a refractory area with some absent flows. We had some success with Hydrofera Blue on this area and it literally closed by 50% however it is recently opened up Foley. Both of these were debridement today of surface eschar. She tolerates this poorly 01/25/15: No change in the status of this. Thick adherent escar. Very poor tolerance of any attempt at debridement. I had healed the right medial malleolus wound for a considerable amount of time and had the left one down to about 50% of the volume although this is totally regressed over the last 48 weeks. Further the right leg has reopened. she is trying to make a appointment with pain and vascular, previous ablations with  Dr. Aleda Grana. I do not believe there is an arterial insufficiency issue here 02/01/15 the status of the adherent eschar bilaterally is actually improved. No debridement was done. She did not manage to get vascular studies done 02/08/15 continued debridement of the area was done today. The slough is less adherent and comes off with less pressure. There is no surrounding infection peripheral pulses are intact 02/15/15 selective debridement with a disposable curette. Again the slough is less adherent and comes off with less difficulty. No surrounding infection peripheral pulses are intact. 02/22/15 selective debridement of the right medial ankle wound. Slough comes off with less difficulty. No obvious surrounding infection peripheral pulses are intact I did not debridement the one on the left. Both of these are stable to improved 03/01/15 selective debridement of both wound areas using a curette to. Adherent slough cup soft with less difficulty. No obvious surrounding infection. The patient tells me that 2 days ago she noted a rash above the right leg wrap. She did not have this on her lower legs when she change this over she arrives with widespread left greater than right almost folliculitis-looking rash which is extremely pruritic. I don't see anything to culture here. There is no rash on the rest of her body. She feels well systemically. 03/08/15; selective debridement of both wounds using a curette. Base of this does not look unhealthy. She had limegreen drainage coming out of the left leg wound and describes a lot of drainage. The rash on her left leg looks improved to. No cultures were done. 03/22/15; patient was not here last week. Basal wounds does not look healthy and there is no surrounding erythema. No drainage. There is still a  rash on the left leg that almost looks vasculitic however it is clearly limited to the top of where the wrap would be. 04/05/15; on the right required a surgical  debridement of surface eschar and necrotic subcutaneous tissue. I did not debridement the area on the left. These continue to be large open wounds that are not changing that much. We were successful at one point in healing the area on the right, and at the same time the area on the left was roughly half the size of current measurements. I think a lot of the deterioration has to do with the prolonged time the patient is on her feet at work 04/19/15 I attempted-like surface debridement bilaterally she does not tolerate this. She tells me that she was in allergic care yesterday with extreme pain over her left lateral malleolus/ankle and was told that she has an "sprain" 05/03/15; large bilateral venous insufficiency wounds over the medial malleolus/medial aspect of her ankles. She complains of copious amounts of drainage and his usual large amounts of pain. There is some increasing erythema around the wound on the right extending into the medial aspect of her foot to. historically she came in with these wounds the right one healed and the left one came down to roughly half its current size however the right one is reopened and the left is expanded. 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 05/10/15 large bilateral venous insufficiency wounds. There is less adherence surface left however the surface culture that I did last week grew pseudomonas therefore bilateral selective debridement score necessary. There is surrounding erythema. The patient describes severe bilateral drainage and a lot of pain in the left ankle. Apparently her podiatrist was were ready to do a cortisone shot 05/17/15; the patient complains of pain and again copious amounts of drainage. 05/24/15; we used Iodo flex last week. Patient notes considerable improvement in wound drainage. Only needed to change this once. 05/31/15; we continued Iodoflex; the base of these large wounds  bilaterally is not too bad but there is probably likely a significant bioburden here. I would like to debridement just doesn't tolerate it. 06/06/14 I would like to continue the Iodoflex although she still hasn't managed to obtain supplies. She has bilateral medial malleoli or large wounds which are mostly superficial. Both of them are covered circumferentially with some nonviable fibrinous slough although she tolerates debridement very poorly. She apparently has an appointment for an ablation on the right leg by interventional radiology. 06/14/15; the patient arrives with the wounds and static condition. We attempted a debridement although she does not do well with this secondary to pain. I 07/05/15; wounds are not much smaller however there appears to be a cleaner granulating base. The left has tight fibrinous slough greater than the right. Debridement is tolerated poorly due to pain. Iodoflex is done more for these wounds in any of the multitude of different dressings I have tried on the left 1 and then subsequently the right. 07/12/15; no change in the condition of this wound. I am able to do an aggressive debridement on the right but not the left. She simply cannot tolerate it. We have been using Iodoflex which helps somewhat. It is worthwhile remembering that at one point we healed the right medial ankle wound and the left was about 25% of the current circumference. We have suggested returning to vascular surgery for review of possible further ablations for  one reason or another she has not been able to do this. 07/26/15 no major change in the condition of either wound on her medial ankle. I did not attempt to debridement of these. She has been aggressively scrubbing these while she is in the shower at home. She has her supply of Iodoflex which seems to have done more for these wounds then anything I have put on recently. 08/09/15 wound area appears larger although not verified by measurements. Using  Iodoflex 09/05/2015 -- she was here for avisit today but had significant problems with the wound and I was asked to see her for a physician opinion. I have summarize that this lady has had surgery on her left lower extremity about 10 years ago where the possible veins stripping was done. She has had an opinion from interventional radiology around November 2016 where no further sclerotherapy was ordered. The patient works 12 hours a day and stands on a concrete floor with work boots and is unable to get the proper compression she requires and cannot elevate her limbs appropriately at any given time. She has recently grown Pseudomonas from her wound culture but has not started her ciprofloxacin which was called in for her. 09/13/15 this continues to be a difficult situation for this patient. At one point I had this wound down to a 1.5 x 1.5" wound on her left leg. This is deteriorated and the right leg has reopened. She now has substantial wounds on her medial calcaneus, malleoli and into her lower leg. One on the left has surface eschar but these are far too painful for me to debridement here. She has a vascular surgery appointment next week to see if anything can be done to help here. I think she has had previous ablations several years ago at [facility]. She has no major edema. She tells me that she did not get product last time [facility]] and went for several days without it. She continues to work in work boots 12 hours a day. She cannot get compression/4-layer under her work boots. 09/20/15 no major change. Periwound edema control was not very good. Her point with pain and vascular is next Wednesday the 25th 09/28/15; the patient is seen vascular surgery and is apparently scheduled for repeat duplex ultrasounds of her bilateral lower legs next week. 10/05/15; the patient was seen by Dr. Doren Custard of vascular surgery. He feels that she should have arterial insufficiency excluded as cause/contributed to  her nonhealing stage she is therefore booked for an arteriogram. She has apparently monophasic signals in the dorsalis pedis pulses. She also of course has known severe chronic venous insufficiency with previous procedures as noted previously. I had another long discussion with the patient today about her continuing to work 12 hour shifts. I've written her out for 2 months area had concerns about this as her work location is currently undergoing significant turmoil and this may lead to her termination. She is aware of this however I agree with her that she simply cannot continue to stand for 12 hours multiple days a week with the substantial wound areas she has. 10/19/15; the Dr. Doren Custard appointment was largely for an arteriogram which was normal. She does not have an arterial issue. He didn't make a comment about her chronic venous insufficiency for which she has had previous ablations. Presumably it was not felt that anything additional could be done. The patient is now out of work as I prescribed 2 weeks ago. Her wounds look somewhat less  aggravated presumably because of this. I felt I would give debridement another try today 10/25/15; no major change in this patient's wounds. We are struggling to get her product that she can afford into her own home through her insurance. 11/01/15; no major change in the patient's wounds. I have been using silver alginate as the most affordable product. I spoke to Dr. Marla Roe last week with her requested take her to the OR for surgical debridement and placement of ACEL. Dr. Marla Roe told me that she would be willing to do this however [facility] will not cover this, fortunately the patient has [place] healthcare of some variant 11/08/15; no major change in the patient's wounds. She has been completely nonviable surface that this but is in too much pain with any attempted debridement are clinic. I have arranged for her to see Dr. Marla Roe ham of plastic  surgery and this appointment is on Monday. I am hopeful that they will take her to the OR for debridement, possible ACEL ultimately possible skin graft 11/22/15 no major change in the patient's wounds over her bilateral medial calcaneus medial malleolus into the lower legs. Surface on these does not look too bad however on the left there is surrounding erythema and tenderness. This may be cellulitis or could him sleepy tinea. 11/29/15; no major changes in the patient's wounds over her bilateral medial malleolus. There is no infection here and I don't think any additional antibiotics are necessary. There is now plan to move forward. She sees Dr. Marla Roe in a week's time for preparation for operative debridement and ACEL placement I believe on 7/12. She then has a follow-up appointment with Dr. Marla Roe on 7/21 12/28/15; the patient returns today having been taken to the [place] by Dr. Marla Roe 12/12/15 she underwent debridement, intraoperative cultures [which were negative]. She had placement of a wound VAC. Parent really ACEL was not available to be placed. The wound VAC foam apparently adhered to the wound since then she's been using silver alginate, Xeroform under Ace wraps. She still says there is a lot of drainage and a lot of pain 01/31/16; this is a patient I see monthly. I had referred her to Dr. Marla Roe him of plastic surgery for large wounds on her bilateral medial ankles. She has been to the OR twice once in early July and once in early August. She tells me over the last 3 weeks she has been using the wound VAC with ACEL underneath it. On the right we've simply been using silver alginate. Under Kerlix Coban wraps. 02/28/16; this is a patient I'm currently seeing monthly. She is gone on to have a skin graft over her large venous insufficiency ulcer on the left medial ankle. This was done by Dr. Marla Roe him. The patient is a bit perturbed about why she didn't have one on her right medial  ankle wound. She has been using silver alginate to this. 03/06/16; I received a phone call from her plastic surgery Dr. Marla Roe. She expressed some concern about the viability of the skin graft she did on the left medial ankle wound. Asked me to place Endoform on this. She told me she is not planning to do a subsequent skin graft on the right as the left one did not take very well. I had placed Hydrofera Blue on the right 03/13/16; continue to have a reasonably healthy wound on the right medial ankle. Down to 3 mm in terms of size. There is epithelialization here. The area on the  left medial ankle is her skin graft site. I suppose the last week this looks somewhat better. She has an open area inferiorly however in the center there appears to be some viable tissue. There is a lot of surface callus and eschar that will eventually need to come off however none of this looked to be infected. Patient states that the is able to keep the dressing on for several days which is an improvement. 03/20/16 no major change in the circumference of either wound however on the left side the patient was at Dr. Eusebio Friendly office and they did a debridement of left wound. 50% of the wound seems to be epithelialized. I been using Endoform on the left Hydrofera Blue in the right 03/27/16; she arrives today with her wound is not looking as healthy as they did last week. The area on the right clearly has an adherent surface to this a very similar surface on the left. Unfortunately for this patient this is all too familiar problem. Clearly the Endoform is not working and will need to change that today that has some potential to help this surface. She does not tolerate debridement in this clinic very well. She is changing the dressing wants 04/03/16; patient arrives with the wounds looking somewhat better especially on the right. Dr. Migdalia Dk change the dressing to silver alginate when she saw her on Monday and also sold her  some compression socks. The usefulness of the latter is really not clear and woman with severely draining wounds. 04/10/16; the patient is doing a bit of an experiment wearing the compression stockings that Dr. Migdalia Dk provided her to her left leg and the out of legs based dressings that we provided to the right. 05/01/16; the patient is continuing to wear compression stockings Dr. Migdalia Dk provided her on the left that are apparently silver impregnated. She has been using Iodoflex to the right leg wound. Still a moderate amount of drainage, when she leaves here the wraps only last for 4 days. She has to change the stocking on the left leg every night 05/15/16; she is now using compression stockings bilaterally provided by Dr. Marla Roe. She is wearing a nonadherent layer over the wounds so really I don't think there is anything specific being done to this now. She has some reduction on the left wound. The right is stable. I think all healing here is being done without a specific dressing 06/09/16; patient arrives here today with not much change in the wound certainly in diameter to large circular wounds over the medial aspect of her ankle bilaterally. Under the light of these services are certainly not viable for healing. There is no evidence of surrounding infection. She is wearing compression stockings with some sort of silver impregnation as prescribed by Dr. Marla Roe. She has a follow-up with her tomorrow. 06/30/16; no major change in the size or condition of her wounds. These are still probably covered with a nonviable surface. She is using only her purchase stockings. She did see Dr. Marla Roe who seemed to want to apply Dakin's solution to this I'm not extreme short what value this would be. I would suggest Iodoflex which she still has at home. 07/28/16; I follow Mrs. Risk episodically along with Dr. Marla Roe. She has very refractory venous insufficiency wounds on her bilateral medial legs  left greater than right. She has been applying a topical collagen ointment to both wounds with Adaptic. I don't think Dr. Marla Roe is planning to take her back to the OR. 08/19/16;  I follow Mrs. Jeneen Rinks on a monthly basis along with Dr. Marla Roe of plastic surgery. 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 I been following her for a number of years. At one point I was able to get the right medial malleolus wound to heal and had the left medial malleolus down to about half its current size however and I had to send her to plastic surgery for an operative debridement. Since then things have been stable to slightly improve the area on the right is slightly better one in the left about the same although there is much less adherent surface than I'm used to with this patient. She is using some form of liquid collagen gel that Dr. Marla Roe provided a Kerlix cover with the patient's own pressure stockings. She tells me that she has extreme pain in both ankles and along the lateral aspect of both feet. She has been unable to work for some period of time. She is telling me she is retiring at the beginning of April. She sees Dr. Doran Durand of orthopedics next week 09/22/16; patient has not seen Dr. Marla Roe since the last time she is here. I'm not really sure what she is using to the wounds other than bits and pieces of think she had left over including most recently Hydrofera Blue. She is using juxtalite stockings. She is having difficulty with her husband's recent illness "stroke". She is having to transport him to various doctors appointments. Dr. Marla Roe left her the option of a repeat debridement with ACEL however she has not been able to get the time to follow-up on this. She continues to have a fair amount of drainage out of these wounds with certainly precludes leaving dressings on all week 10/13/16; patient has not seen Dr.  Marla Roe since she was last in our clinic. I'm not really sure what she is doing with the wounds, we did try to get her [facility] and I think she is actually using this most of the time. Because of drainage she states she has to change this every second day although this is an improvement from what she used to do. She went to see Dr. Doran Durand who did not think she had a muscular issue with regards to her feet, he referred her to a neurologist and I think the appointment is sometime in June. I changed her back to Iodoflex which she has used in the past but not recently. 11/03/16; the patient has been using Iodoflex although she ran out of this. Still claims that there is a lot of drainage although the wound does not look like this. No surrounding erythema. She has not been back to see Dr. Marla Roe 11/24/16; the patient has been using Iodoflex again but she ran out of it 2 or 3 days ago. There is no major change in the condition of either one of these wounds in fact they are larger and covered in a thick adherent surface slough/nonviable tissue especially on the left. She does not tolerate mechanical debridement in our clinic. Going back to see Dr. Marla Roe of plastic surgery for an operative debridement would seem reasonable. 12/15/16; the patient has not been back to see Dr. Marla Roe. She is been dealing with a series of illnesses and her husband which of monopolized her time. She is been using Sorbact which we largely supplied. She states the drainage is bad enough that it maximum she can go 2-3 days without changing the  dressing 01/12/2017 -- the patient has not been back for about 4 weeks and has not seen Dr. Marla Roe not does she have any appointment pending. 01/23/17; patient has not seen Dr. Marla Roe even though I suggested this previously. She is using Santyl that was suggested last week by Dr. Con Memos this Cost her $16 through her insurance which is indeed surprising 02/12/17;  continuing Santyl and the patient is changing this daily. A lot of drainage. She has not been back to see plastic surgery she is using an Ace wrap. Our intake nurse suggested wrap around stockings which would make a good reasonable alternative 02/26/17; patient is been using Santyl and changing this daily due to drainage. She has not been to see plastic surgery she uses in April Ace wrap to control the edema. She did obtain extremitease stockings but stated that the edema in her leg was to big for these 03/20/17; patient is using Santyl and Anasept. Surfaces looked better today the area on the right is actually measuring a little smaller. She has states she has a lot of pain in her feet and ankles and is asking for a consult to pain control which I'll try to help her with through our case manager. 04/10/17; the patient arrives with better-looking wound surfaces and is slightly smaller wound on the left she is using a combination of Santyl and Anasept. She has an appointment or at least as started in the pain control center associated with [facility] 05/14/17; this is a patient who I followed for a prolonged period of time. She has venous insufficiency ulcers on her bilateral medial ankles. At one point I had this down to a much smaller wound on the left however these reopened and we've never been able to get these to heal. She has been using Santyl and Anasept gel although 2 weeks ago she ran out of the Anasept gel. She has a stable appearance of the wound. She is going to the wound care clinic at [facility]. They wanted do a nerve block/spinal block although she tells me she is reluctant to go forward with that. 05/21/17; this is a patient I have followed for many years. She has venous insufficiency ulcers on her bilateral medial ankles. Chronic pain and deformity in her ankles as well. She is been to see plastic surgery as well as orthopedics. Using PolyMem AG most recently/Kerramax/ABDs  and 2 layer compression. She has managed to keep this on and she is coming in for a nurse check to change the dressing on Tuesdays, we see her on Fridays 06/05/17; really quite a good looking surface and the area especially on the right medial has contracted in terms of dimensions. Well granulated healthy-looking tissue on both sides. Even with an open curet there is nothing that even feels abnormal here. This is as good as I've seen this in quite some time. We have been using PolyMem AG and bringing her in for a nurse check 06/12/17; really quite good surface on both of these wounds. The right medial has contracted a bit left is not. We've been using PolyMem and AG and she is coming in for a nurse visit 06/19/17; we have been using PolyMem AG and bringing her in for a nurse check. Dimensions of her wounds are not better but the surfaces looked better bilaterally. She complained of bleeding last night and the left wound and increasing pain bilaterally. She states her wound pain is more neuropathic than just the wounds. There was some  suggestion that this was radicular from her pain management doctor in talking to her it is really difficult to sort this out. 06/26/17; using PolyMem and AG and bringing her in for a nurse check as All of this and reasonably stable condition. Certainly not improved. The dimensions on the lateral part of the right leg look better but not really measuring better. The medial aspect on the left is about the same. 07/03/16; we have been using PolyMen AG and bringing her in for a nurse check to change the dressings as the wounds have drainage which precludes once weekly changing. We are using all secondary absorptive dressings.our intake nurse is brought up the idea of using a wound VAC/snap VAC on the wound to help with the drainage to see if this would result in some contraction. This is not a bad idea. The area on the right medial is actually looking smaller. Both wounds have a  reasonable-looking surface. There is no evidence of cellulitis. The edema is well controlled 07/10/17; the patient was denied for a snap VAC by her insurance. The major issue with these wounds continues to be drainage. We are using wicked PolyMem AG and she is coming in for a nurse visit to change this. The wounds are stable to slightly improved. The surface looks vibrant and the area on the right certainly has shrunk in size but very slowly 07/17/17; the patient still has large wounds on her bilateral medial malleoli. Surface of both of these wounds looks better. The dimensions seem to come and go but no consistent improvement. There is no epithelialization. We do not have options for advanced treatment products due to insurance issues. They did not approve of the wound VAC to help control the drainage. More recently we've been using PolyMem and AG wicked to allow drainage through. We have been bringing her in for a nurse visit to change this. We do not have a lot of options for wound care products and the home again due to insurance issues 07/24/17; the patient's wound actually looks somewhat better today. No drainage measurements are smaller still healthy-looking surface. We used silver collagen under PolyMen started last week. We have been bringing her in for a dressing change 07/31/17; patient's wound surface continued to look better and I think there is visible change in the dimensions of the wound on the right. Rims of epithelialization. We have been using silver collagen under PolyMen and bringing her in for a dressing change. There appears to be less drainage although she is still in need of the dressing change 08/07/17. Patient's wound surface continues to look better on both sides and the area on the right is definitely smaller. We have been using silver collagen and PolyMen. She feels that the drainage has been it has been better. I asked her about her vascular status. She went to see Dr.  Aleda Grana at [facility] and had some form of ablation. I don't have much detail on this. I haven't my notes from 2016 that she was not a candidate for any further ablation but I don't have any more information on this. We had referred her to vein and vascular I don't think she ever went. He does not have a history of PAD although I don't have any information on this either. We don't even have ABIs in our record 08/14/17; we've been using silver collagen and PolyMen cover. And putting the patient and compression. She we are bringing her in as a nurse visit to change  this because ofarge amount of drainage. We didn't the ABIs in clinic today since they had been done in many moons 1.2 bilaterally. She has been to see vein and vascular however this was at [facility] and she had ablation although I really don't have any information on this all seemed biking get a report. She is also been operatively debrided by plastic surgery and had a cell placed probably 8-12 months ago. This didn't have a major effect. We've been making some gains with current dressings 08/19/17-She is here in follow-up evaluation for bilateral medial malleoli ulcers. She continues to tolerate debridement very poorly. We will continue with recently changed topical treatment; if no significant improvement may consider switching to Iodosorb/Iodoflex. 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 She has been using silver collagen and PolyMem. I believe she has been used and tried on Iodoflex before. 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 These have been very refractory over time. We have been using silver collagen and PolyMen. She is coming in once a week for a doctor's and once a week for  nurse visits. We are actually making some progress 09/18/17; the patient's wounds are smaller especially on the right medial. She arrives today to upset to consider even washing these off with Anasept which I think is been part of the reason this is been closing. We've been using collagen covered in PolyMen otherwise. It is noted that she has a small area of folliculitis on the right medial calf that. As we are wrapping her legs I'll give her a short course of doxycycline to make sure this doesn't amount to anything. She is a long list of complaints today including imbalance, shortness of breath on exertion, inversion of her left ankle. With regards to the latter complaints she is been to see orthopedics and they offered her a tendon release surgery I believe but wanted her wounds to be closed first. I have recommended she go see her primary physician with regards to everything else. 09/25/17; patient's wounds are about the same size. We have made some progress bilaterally although not in recent weeks. She will not allow me T wash these wounds with Anasept even if she is doing her cell. Wheeze we've been using collagen covered in PolyMen. Last week she had a small area of folliculitis this is now opened into a small wound. She completed 5 days of trimethoprim sulfamethoxazole 10/02/17; unfortunately the area on her left medial ankle is worse with a larger wound area towards the Achilles. The patient complains of a lot of pain. She will not allow debridement although visually I don't think there is anything to debridement in any case. We have been using silver collagen and PolyMen for several months now. Initially we are making some progress although I'm not really seeing that today. We will move back to [facility]. His admittedly this is a bit of a repeat however I'm hoping that his situation is different now. The patient tells me she had her leg on the left give out on her yesterday this is process  some pain. 10/09/17; the patient is seen twice a week largely because of drainage issues coming out of the chronic medial bimalleolar wounds that are chronic. Last week the dimensions of the one on the  left looks a little larger I changed her to [facility]. She comes in today with a history of terrible pain in the bilateral wound areas. She will not allow debridement. She will not even allow a tissue culture. There is no surrounding erythema no no evidence of cellulitis. We have been putting her Kerlix Coban man. She will not allow more aggressive compression as there was a suggestion to put her in 3 layer wraps. 10/16/17; large wounds on her bilateral medial malleoli. These are chronic. Not much change from last week. The surface looks have healthy but absolutely no epithelialization. A lot of pain little less so of drainage. She will not allow debridement or even washing these off in the vigorous fashion with Anasept. 10/23/17; large wounds on her bilateral malleoli which are chronic. Some improvement in terms of size perhaps on the right since last time I saw these. She states that after we increased the 3 layer compression there was some bleeding, when she came in for a nurse visit she did not want 3 layer compression put back on about our nurse managed to convince her. She has known chronic venous visit issues and I'm hoping to get her to tolerate the 3 layer compression. using Hydrofera Blue 10/30/17; absolutely no change in the condition of either wound although we've had some improvement in dimensions on the right.. Attempted to put her in 3 layer compression she didn't tolerated she is back in 2 layer compression. We've been using Hydrofera Blue We looked over her past records. She had venous reflux studies in November 2016. There was no evidence of deep venous reflux on the right. Superficial vein did not show the greater saphenous vein at think this is been previously ablated the small  saphenous vein was within normal limits. The left deep venous system showed no DVT the vessels were positive for deep venous reflux in the posterior tibial veins at the ankle. The greater saphenous vein was surgically absent small saphenous vein was within normal limits. She went to vein and vascular at [facility]. I believe she had an ablation on the left greater saphenous vein. I'll update her reflux studies perhaps ever reviewed by vein and vascular. We've made absolutely no progress in these wounds. Will also try to read and TheraSkins through her insurance 11/06/17; W the patient apparently has a 2 week follow-up with vein and vascular I like him to review the whole issue with regards to her previous vascular workup by Dr. Aleda Grana. We've really made no progress on these wounds in many months. She arrives today with less viable looking surface on the left medial ankle wound. This was apparently looking about the same on Tuesday when she was here for nurse visit. 11/13/17; deep tissue culture I did last time of the left lower leg showed multiple organisms without any predominating. In particular no Staphylococcus or group A strep were isolated. We sent her for venous reflux studies. She's had a previous left greater saphenous vein stripping and I think sclerotherapy of the right greater saphenous vein. She didn't really look at the lesser saphenous vein this both wounds are on the medial aspect. She has reflux in the common femoral vein and popliteal vein and an accessory vein on the right and the common femoral vein and popliteal vein on the left. I'm going to have her go to see vein and vascular just the look over things and see if anything else beside aggressive compression is indicated here. We have not  been able to make any progress on these wounds in spite of the fact that the surface of the wounds is never look too bad. 11/20/17; no major change in the condition of the wounds. Patient  reports a large amount of drainage. She has a lot of complaints of pain although enlisting her today I wonder if some of this at least his neuropathic rather than secondary to her wounds. She has an appointment with vein and vascular on 12/30/17. The refractory nature of these wounds in my mind at least need vein and vascular to look over the wounds the recent reflux studies we did and her history to see if anything further can be done here. I also note her gait is deteriorated quite a bit. 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 She has a bilateral Trendelenburg gait. I wonder if this is neuropathic or perhaps multilevel radicular. 11/27/17; her wounds actually looks slightly better. Healthy-looking granulation tissue a scant amount of epithelialization. [place] healthcare will not pay for [employer]. They will play for tri layer Oasis and Dermagraft. This is not a diabetic ulcer. We'll try for the tri layer Oasis. She still complains of some drainage. She has a vein and vascular appointment on 12/30/17 12/04/17; the wounds visually look quite good. Healthy-looking granulation with some degree of epithelialization. We are still waiting for response to our request for trial to try layer Oasis. Her appointment with vascular to review venous and arterial issues isn't sold the end of July 7/31. Not allow debridement or even vigorous cleansing of the wound surface. 12/18/17; slightly smaller especially on the right. 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 We've been using Hydrofera Blue. We still are looking into triple layer Oasis through her insurance 01/08/18 on evaluation today patient's wound actually appears to be showing signs of good improvement at this point in time. She has been tolerating the dressing changes without complication. Fortunately there does not appear to be any evidence of infection at this point in time. We have been utilizing silver nitrate  which does seem to be of benefit for her which is also good news. Overall I'm very happy with how things seem to be both regards appearance as well as measurement. Patient did see Dr. Bridgett Larsson for evaluation on 12/30/17. In his assessment he felt that stripping would not likely add much more than chronic compression to the patient's healing process. His recommendation was to follow-up in three months with Dr. Doren Custard if she hasn't healed in order to consider referral back to you and see vascular where she previously was in a trial and was able to get her wound to heal. I'll be see what she feels she when you staying compression and he reiterated this as well. 01/13/18 on evaluation today patient appears to actually be doing very well in regard to her bilateral medial malleolus ulcers. She seems to have tolerated the chemical cauterization with silver nitrate last week she did have some pain through that evening but fortunately states that I'll be see since it seems to be doing better she is overall pleased with the progress. 01/21/18; really quite a remarkable improvement since I've last seen these wounds. We started using silver nitrate specially on the islands of hyper granulation which for some reason her around the wound circumference. This is really done quite nicely. Primary dressing Hydrofera Blue under 4 layer compression. She seems to be able to hold out without a nurse rewrap. Follow-up in 1 week  01/28/18; we've continued the hydrofera blue but continued with chemical cauterization to the wound area that we started about a month ago for irregular hyper granulation. She is made almost stunning improvement in the overall wound dimensions. I was not really expecting this degree of improvement in these chronic wounds 02/05/18; we continue with Hydrofera Bluebut of also continued the aggressive chemical cauterization with silver nitrate. We made nice progress with the right greater than left  wound. 02/12/18. We continued with Hydrofera Blue after aggressive chemical cauterization with silver nitrate. We appear to be making nice progress with both wound areas 02/19/2018; we continue with [facility] after washing the wounds vigorously with Anasept spray and chemical cauterization with silver nitrate. We are making excellent progress. The area on the right's just about closed 02/26/2018. The area on the left medial ankle had too much necrotic debris today. I used a #5 curette we are able to get most of the soft. I continued with the silver nitrate to the much smaller wound on the right medial ankle she had a new area on her right lower pretibial area which she says was due to a role in her compression 03/05/2018; both wound areas look healthy. Not much change in dimensions from last week. I continue to use silver nitrate and Hydrofera Blue. The patient saw Dr. Doren Custard of vein and vascular. He felt she had venous stasis ulcers. He felt based on her previous arteriogram she should have adequate circulation for healing. Also she has deep venous reflux but really no significant correctable superficial venous reflux at this time. He felt we should continue with conservative management including leg elevation and compression 04/02/2018; since we last saw this woman about a month ago she had a fall apparently suffered a pelvic fracture. I did not look up the x-[name]. Nevertheless because of pain she literally was bedbound for 2 weeks and had home health coming out to change the dressing. Somewhat predictably this is resulted in considerable improvement in both wound areas. The right is just about closed on the medial malleolus and the left is about half the size. 04/16/2018; both her wounds continue to go down in size. Using Hydrofera Blue. 05/07/18; both her wounds appeared to be improving especially on the right where it is almost closed. We are using Hydrofera Blue 05/14/2018; slightly worse this  week with larger wounds. Surface on the left medial not quite as good. We have been using Hydrofera Blue 05/21/18; again the wounds are slightly larger. Left medial malleolus slightly larger with eschar around the circumference. We have been using Hydrofera Blue undergoing a wraps for a prolonged period of time. This got a lot better when she was more recumbent due to a fall and a back injury. I change the primary dressing the silver alginate today. She did not tolerate a 4 layer compression previously although I may need to bring this up with her next time 05/28/2018; area on the left medial malleolus again is slightly larger with more drainage. Area on the right is roughly unchanged. She has a small area of folliculitis on the right medial just on the lower calf. This does not look ominous. 06/03/2018 left medial malleolus slightly smaller in a better looking surface. We used silver nitrate on this last time with silver alginate. The area on the right appears slightly smaller 1/10; left medial malleolus slightly smaller. Small open area on the right. We used silver nitrate and silver alginate as of 2 weeks ago. We continue with  the wound and compression. These got a lot better when she was off her feet 1/17; right medial malleolus wound is smaller. The left may be slightly smaller. Both surfaces look somewhat better. 1/24; both wounds are slightly smaller. Using silver alginate under Unna boots 1/31; both wounds appear smaller in fact the area on the right medial is just about closed. Surface eschar. We have been using silver alginate under Unna boots. The patient is less active now spends let much less time on her feet and I think this is contributed to the general improvement in the wound condition 2/7; both wounds appear smaller. I was hopeful the right medial would be closed however there there is still the same small open area. Slight amount of surface eschar on the left the dimensions are  smaller there is eschar but the wound edges appear to be free. We have been using silver alginate under Unna boot's 2/14; both wounds once again measure smaller. Circumferential eschar on the left medial. We have been using silver alginate under Unna boots with gradual improvement 2/21; the area on the right medial malleolus has healed. The area on the left is smaller. We have been using silver alginate and Unna boots. We can discharge wrapping the right leg she has 20/30 stockings at home she will need to protect the scar tissue in this area 2/28; the area on the right medial malleolus remains closed the patient has a compression stocking. The area on the left is smaller. We have been using silver alginate and Unna boots. 3/6 the area on the right medial ankle remains closed. Good edema control noted she is using her own compression stocking. The area on the left medial ankle is smaller. We have been managing this with silver alginate and Unna boots which we will continue today. 3/13; the area on the right medial ankle remains closed and I'm declaring it healed today. When necessary the left is about the same still a healthy-looking surface but no major change and wound area. No evidence of infection and using silver alginate under unna and generally making considerable improvement 3/27 the area on the right medial ankle remains closed the area on the left is about the same as last week. Certainly not any worse we have been using silver alginate under an Unna boot 4/3; the area on the right medial ankle remains closed per the patient. We did not look at this wound. The wound on the left medial ankle is about the same surface looks healthy we have been using silver alginate under an Unna boot 4/10; area on the right medial ankle remains closed per the patient. We did not look at this wound. The wound on the left medial ankle is slightly larger. The patient complains that the [facility] caused  burning pain all week. She also told us that she was a lot more active this week. Changed her back to silver alginate 4/17; right medial ankle still closed per the patient. Left medial ankle is slightly larger. Using silver alginate. She did not tolerate Hydrofera Blue on this area 4/24; right medial ankle remains closed we have not look at this. The left medial ankle continues to get larger today by about a centimeter. We have been using silver alginate under Unna boots. She complains about 4 layer compression as an alternative. She has been up on her feet working on her garden 5/8; right medial ankle remains closed we did not look at this. The left medial ankle has  increased in size about 100%. We have been using silver alginate under Unna boots. She noted increased pain this week and was not surprised that the wound is deteriorated 5/15; no major change in SA however much less erythema ( one week of doxy ocellulitis). 5/22-65 year old female returns at 1 week to the clinic for left medial ankle wound for which we have been using silver alginate under 3 layer compression She was placed on DOXY at last visit - the wound is wider at this visit. She is in 3 layer compression 5/29; change to [facility] last week. I had given her empiric doxycycline 2 weeks ago for a week. She is in 3 layer compression. She complains of a lot of pain and drainage on presentation today. 6/5; using Hydrofera Blue. I gave her doxycycline recently empirically for erythema and pain around the wound. Believe her cultures showed enterococcus which not would not have been well covered by doxycycline nevertheless the wound looks better and I don't feel specifically that the enterococcus needs to be covered. She has a new what looks like a wrap injury on her lateral left ankle. 6/12; she is using Hydrofera Blue. She has a new area on the left anterior lower tibial area. This was a wrap injury last week. 6/19; the patient is  using Hydrofera Blue. She arrived with marked inflammation and erythema around the wound and tenderness. 12/01/18 on evaluation today patient appears to be doing a little bit better based on what I'm hearing from the standpoint of lassos evaluation to this as far as the overall appearance of the wound is concerned. Then sometime substandard she typically sees Dr. Dellia Nims. Nonetheless overall very pleased with the progress that she's made up to this point. No fevers, chills, nausea, or vomiting noted at this time. 7/10; some improvement in the surface area. Aggressively debrided last week apparently. I went ahead with the debridement today although the patient does not tolerate this very well. We have been using Iodoflex. Still a fair amount of drainage 7/17; slightly smaller. Using Iodoflex. 7/24; no change from last week in terms of surface area. We have been using Iodoflex. Surface looks and continues to look somewhat better 7/31; surface area slightly smaller better looking surface. We have been using Iodoflex. This is under Unna boot compression 8/7-Patient presents at 1 week with Unna boot and Iodoflex, wound appears better 8/14-Patient presents at 1 week with Iodoflex, we use the Unna boot, wound appears to be stable better.Patient is getting Botox treatment for the inversion of the foot for tendon release, Next week 8/21; we are using Iodoflex. Unna boot. The wound is stable in terms of surface area. Under illumination there is some areas of the wound that appear to be either epithelialized or perhaps this is adherent slough at this point I was not really clear. It did not wipe off and I was reluctant to debride this today. 8/28; we are using Iodoflex in an Unna boot. Seems to be making good improvement. 9/4; using Iodoflex and wound is slightly smaller. 9/18; we are using Iodoflex with topical silver nitrate when she is here. The wound continues to be smaller 10/2; patient missed her  appointment last week due to GI issues. She left and Iodoflex based dressing on for 2 weeks. Wound is about the same size about the size of a dime on the left medial lower 10/9 we have been using Iodoflex on the medial left ankle wound. She has a new superficial probable wrap injury  on the dorsal left ankle 10/16; we have been using Hydrofera Blue since last week. This is on the left medial ankle 10/23; we have been using Hydrofera Blue since 2 weeks ago. This is on the left medial ankle. Dimensions are better 11/6; using Hydrofera Blue. I think the wound is smaller but still not closed. Left medial ankle 11/13; we have been using Hydrofera Blue. Wound is certainly no smaller this week. Also the surface not as good. This is the remanent of a very large area on her left medial ankle. 11/20; using Sorbact since last week. Wound was about the same in terms of size although I was disappointed about the surface debris 12/11; 3-week follow-up. Patient was on vacation. Wound is measuring slightly larger we have been using Sorbact. 12/18; wound is about the same size however surface looks better last week after debridement. We have been using Sorbact under compression 1/15 wound is probably twice the size of last time increased in length nonviable surface. We have been using Sorbact. She was running a mild fever and missed her appointment last week 1/22; the wound is come down in size but under illumination still a very adherent debris we have been Hydrofera Blue that I changed her to last week 1/29; dimensions down slightly. We have been using Hydrofera Blue 2/19 dimensions are the same however there is rims of epithelialization under illumination. Therefore more the surface area may be epithelialized 2/26; the patient's wound actually measures smaller. The wound looks healthy. We have been using Hydrofera Blue. I had some thoughts about running Apligraf then I still may do that however this looks so much  better this week we will delay that for now 3/5; the wound is small but about the same as last week. We have been using Hydrofera Blue. No debridement is required today. 3/19; the wound is about the size of a dime. Healthy looking wound even under illumination. We have been using Hydrofera Blue. No mechanical debridement is necessary 3/26; not much change from last week although still looks very healthy. We have been using Hydrofera Blue under Unna boots Patient was offered an ankle fusion by podiatry but not until the wound heals with a proceed with this. 4/9; the patient comes in today with her original wound on the medial ankle looking satisfactory however she has some uncontrolled swelling in the middle part of her leg with 2 new open areas superiorly just lateral to the tibia. I think this was probably a wrap issue. She said she felt uncomfortable during the week but did not call in. We have been using Hydrofera Blue 4/16; the wound on the medial ankle is about the same. She has innumerable small areas superior to this across her mid tibia. I think this is probably folliculitis. She is also been working in the yard doing a lot of sweating 4/30; the patient issue on the upper areas across her mid tibia of all healed. I think this was excessive yard work if I remember. Her wound on the medial ankle is smaller. Some debris on this we have been using Hydrofera Blue under Unna boots 5/7; mid tibia. She has been using Hydrofera Blue under an Unna wrap. She is apparently going for her ankle surgery on June 3 10/28/19-Patient returns to clinic with the ankle wound, we are using Hydrofera Blue under Unna wrap, surgery is scheduled for her left foot for June 3 so she will be back for nurse visit next week READMISSION 01/17/2020 Mrs.  [name] is a 65 year old woman we have had in this clinic for a long period of time with severe venous hypertension and refractory wounds on her medial lower legs and ankles  bilaterally. 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 When she was off her legs for a prolonged period example when she fell and suffered a compression fracture things would heal up quite nicely. She is now retired and we managed to heal up the right medial leg wound. The left one was very tiny last time I saw this although still refractory. 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 It got to the point where this was interfering with her walking and she elected to proceed with a ankle arthrodesis to straighten her her ankle and leave her with a functional outcome for mobilization. The patient was referred to Dr. Doren Custard and really this took some time to arrange. Dr. Doren Custard saw her on 12/07/2019. Once again he verified that she had no arterial issues. She had previously had an angiogram several years ago. Follow-up ABIs on the left showed an ABI of 1.12 with triphasic waveforms and a TBI of 0.92. She is felt to have chronic deep venous insufficiency but I do not think it was felt that anything could be done from about this from an ablation point of view. At the time Dr. Doren Custard saw this patient the wounds actually look closed via the pictures in his clinic. The patient finally underwent her surgery on 12/15/2019. This went reasonably well and there was a good anatomic outcome. She developed a small distal wound dehiscence on the lateral part of the surgical wound. However more problematically she is developed recurrence of the wound on the medial left ankle. There are actually 2 wounds here one in the distal lower leg and 1 pretty much at the level of the medial malleolus. It is a more distal area that is more problematic. She has been using Hydrofera Blue which started on Friday before this she was  simply Ace wrapping. There was a culture done that showed Pseudomonas and she is on ciprofloxacin. A recent CNS on 8/11 was negative. The patient reports some pain but I generally think this is improving. She is using a cam boot completely nonweightbearing using a walker for pivot transfers and a wheelchair 8/24; not much improvement unfortunately she has a surgical wound on the lateral part in the venous insufficiency wound medially. The bottom part of the medial insufficiency wound is still necrotic there is exposed tendon here. We have been using Hydrofera Blue under compression. Her edema control is however better 8/31; patient in for follow-up of his surgical wound on the lateral part of her left leg and chronic venous insufficiency ulcers medially. We put her back in compression last week. She comes in today with a complaint of 3 or 4 days worth of increasing pain. She felt her cam walker was rubbing on the area on the back of her heel. However there is intense erythema seems more likely she has cellulitis. She had 2 cultures done when she was seeing podiatry in the postop. One of them in late July showed Pseudomonas and she received a course of ciprofloxacin the other was negative on 8/11 she is allergic to penicillin with anaphylactoid complaints of hives oral  swelling via information in epic 9/9; when I saw this patient last week she had intense anterior erythema around her wound on the right lateral heel and ankle and also into the right medial heel. Some of this was no doubt drainage and her walker boot however I was convinced she had cellulitis. I gave her Levaquin and Bactrim she is finishing up on this now. She is following up with Dr. [name] he saw her yesterday. He is taken her out of the walking boot of course she is still nonweightbearing. Her x-[name] was negative for any worrisome features such as soft tissue air etc. Things are a lot better this week. She has home health. We have been  using Hydrofera Blue under an [employer] which she put back on yesterday. I did not wrap her last week 9/17; her surrounding skin looks a lot better. In fact the area on the left lateral ankle has just a scant amount of eschar. The only remaining wound is the large area on the left medial ankle. Probably about 60% of this is healthy granulation at the surface however she has a significant divot distally. This has adherent debris in it. I been using debridement and silver collagen to try and get this area to fill-in although I do not think we have made much progress this week 9/24; the patient's wound on the left medial ankle looks a lot better. The deeper divot area distally still requires debridement but this is cleaning up quite nicely we have been using silver collagen. The patient is complaining of swelling in her foot and is worried that that is contributing to the nonhealing of the ankle wound. She is also complaining of numbness in her anterior toes 10/4; left medial ankle. The small area distally still has a divot with necrotic material that I have been debriding away. This has an undermining area. She is approved for Apligraf. She saw Dr. [name] her surgeon on 10/1. I think he declared himself is satisfied with the condition of things. Still nonweightbearing till the end of the month. 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 Her surgical wound is well closed. There is no evidence of infection 10/11; the patient arrived in clinic today with the expectation that we be able to put an Apligraf on this area after debridement however she arrives with a relatively large amount of green drainage on the dressing. The patient states that this started on Friday. She has not been systemically unwell. 10/19; culture I did last week showed both Enterococcus and Pseudomonas. I think this came in separate parts because I stopped her ciprofloxacin I gave her and prescribed her  linezolid however now looking at the final culture result this is Pseudomonas which is resistant to quinolones. She has not yet picked up the linezolid apparently phone issues. We are also trying to get a topical antibiotic out of [place] in [place] they can be applied by home health. She is still having green drainage 10/16; the patient has her topical antibiotic from [facility] in [place]. This is a compounded gel with vancomycin and ciprofloxacin and gentamicin. We are applying this on the wound bed with silver alginate over the top with Unna boot wraps. She arrives in clinic today with a lot less ominous looking drainage although she is only use this topical preparation once the second time today. She sees Dr. [name] her surgeon on Friday she has home health changing the dressing 11/2; still using  her compounded topical antibiotic under silver alginate. Surface is cleaning up there is less drainage. We had an Apligraf for her today and I elected to apply it. A light coating of her antibiotic 04/25/2020 upon evaluation today patient appears to be doing well with regard to her ankle ulcer. There is a little bit of slough noted on the surface of the wound I am can have to perform sharp debridement to clear this away today. With that being said other than that fact overall I feel like she is making progress and we do see some new epithelial growth. There is also some improvement in the depth of the wound and that distal portion. There is little bit of slough there as well. 12/7; 2-week follow-up. Apligraf #3. Dimensions are smaller. Closing in especially inferiorly. Still some surface debris. Still using the [facility] topical antibiotic but I told her that I don't think this needs to be renewed 12/21; 2-week follow-up. Apligraf #4 dimensions are smaller. Nice improvement 06/05/2020; 2-week follow-up. The patient's wound on the left medial ankle looks really excellent. Nice granulation.  Advancing epithelialization no undermining no evidence of infection. We would have to reapply for another Apligraf but with the condition of this wound I did not feel strongly about it. We used Hydrofera Blue under the same degree of compression. She follows up with Dr. [name] her surgeon a week Friday 06/13/2020 upon evaluation today patient appears to be doing excellent in regard to her wound. She has been tolerating the dressing changes without complication. Fortunately there is no signs of active infection at this time. No fevers, chills, nausea, vomiting, or diarrhea. She was using Hydrofera Blue last week. 06/20/2020 06/20/2020 on evaluation today patient actually appears to be doing excellent in regard to her wound. This is measuring better and looking much better as well. She has been using the collagen that seems to be doing better for her as well even though the [facility] was and is not sticking or feeling as rough on her wound. She did see Dr. [name] on Friday he is very pleased he also stated none of the hardware has shifted. That is great news 1/27; the patient has a small clean wound all that is remaining. I agree that this is too small to really consider an Apligraf. Under illumination the surface is looking quite good. We have been using collagen although the dimensions are not any better this week 2/2; the patient has a small clean wound on the left medial ankle. Although this left of her substantial original areas. Measurements are smaller. We have been using polymen Ag under an Haematologist. 2/10; small area on the left medial ankle. This looks clean nothing to debride however dimensions are about the same we have been using polymen I think now for 2 weeks 2/17; not much change in surface area. We have been using polymen Ag without any improvement. 3/17; 1 month follow-up. The patient has been using endoform without any improvement in fact I think this looks worse with more depth and  more expansion 3/24; no improvement. Perhaps less debris on the surface. We have been using Sorbact for 1 week 4/4; wound measures larger. She has edema in her leg and her foot which she tells as her wrap came down. We have been using Unna boots. Sorbact of the wound. She has been approved for Apligraf 09/12/2020 upon evaluation today patient appears to be doing well with regard to her wound. We did get the Apligraf reapproved  which is great news we have that available for application today. Fortunately there is no signs of infection and overall the patient seems to be doing great. The wound bed is nice and clean. 4/27; patient presents for her second application of Apligraf. She states over the past week she has been on her feet more often due to being outside in her garden. She has noted more swelling to her foot as a result. She denies increased warmth, pain or erythema to the wound site. 10/10/2020 upon evaluation today patient appears to be doing well with regard to her wound which does not appear to be quite as irritated as last week from what I am hearing. With that being said unfortunately she is having issues with some erythema and warmth to touch as well as an increase size. I do believe this likely is infected. 10/17/2020 upon evaluation today patient appears to be doing excellent in regard to her wound this is significantly improved as compared to last week. Fortunately I think that the infection is much better controlled at this point. She did have evidence of both Enterococcus as well as Staphylococcus noted on culture. Enterococcus really would not be helped significantly by the Cipro but the wound is doing so much better I am under the assumption that the Staphylococcus is probably the main organism that is causing the current infection. Nonetheless I think that she is doing excellent as far as that is concerned and I am very pleased in that regard. I would therefore recommend she  continue with the Cipro. 10/31/2020 upon evaluation today patient appears to be doing well with regard to her wound. She has been tolerating the dressing changes without complication. Fortunately there is no signs of active infection and overall I am extremely pleased with where things stand today. No fevers, chills, nausea, vomiting, or diarrhea. With that being said she does have some green drainage coming from the wound and although it looks okay I am a little concerned about the possibility of a continuing infection. Specifically with Pseudomonas. For that reason I will go ahead and send in a prescription for Cipro for her to be continued. 11/14/2020 upon evaluation today patient appears to be doing very well currently in regard to her wound on her leg. She has been tolerating the dressing changes without complication. Fortunately I feel like the infection is finally under good control here. Unfortunately we do not have the Apligraf for application today although we can definitely order to have it in place for next week. That will be her fifth and final of the current series. Nonetheless I feel like her wound is really doing quite well which is great news. 11/21/2020 upon evaluation today patient appears to be doing well with regard to her wound on the medial ankle. Fortunately I think the infection is under control and I do believe we can go ahead and reapply the Apligraf today. She is in agreement with that plan. There does not appear to be any signs of active infection at this time which is great news. No fevers, chills, nausea, vomiting, or diarrhea. 12/05/2020 upon evaluation today patient's wound bed actually showed signs of good granulation epithelization at this point. There does not appear to be any signs of infection which is great news and overall very pleased with where things stand. Overall the patient seems to be doing fairly well in my opinion with regard to her wound although I do believe  she continues to build up a lot  of biofilm I think she could benefit from using PuraPly at this point. 12/12/2020 upon evaluation today patient's wound actually appears to be doing decently well today. The Unna boot has not been quite as well-tolerated so that more uncomfortable for her and even causing some pressure over the plate on the lateral portion of her foot which is 90 where the wound is. There did not appear to be any significant deep tissue injury with that there may be a minimal change in the skin noted I think that we may want to go back to the Coflex 2 layer which is a little bit easier on her skin it seems. 12/19/2020 upon evaluation today patient actually seems to be making great progress with the PuraPly currently. She in fact seems to be much better as far as the overall appearance of the wound bed is concerned I am very happy in this regard. I do not see any signs of of infection which is great news as well. No fevers, chills, nausea, vomiting, or diarrhea. 12/26/2020 upon evaluation today patient appears to actually be doing better in regard to her wound on the left medial ankle region. The surface of the wound is actually doing significantly better which is great news. There does not appear to be any signs of infection which is also great news and in general I am extremely pleased with where we stand today. 01/02/2021 upon evaluation today patient appears to be doing well with regard to her wound. In fact this is showing signs of excellent improvement and very pleased with where things stand. In fact the last 3 appointments have all shown signs of this getting smaller which is excellent news. I have not even had to perform any debridement and today is no exception. Overall I feel like this is dramatically improved compared to previous. T oday is PuraPly application #4. 0/17/5102 upon evaluation today patient appears to be doing excellent in regard to her wound this is continue to show  signs of improvement and overall I am extremely pleased with where we stand today. She is actually here for PuraPly application #5. Every time we have applied this she is noted definite improvement on measurements. 01/23/2021 upon evaluation today patient is actually making good progress in regard to her wound. This was actually on just a little bit longer this time compared to previous due to the fact that she did have to go out of town. She is actually here for PuraPly application #6. We have definitely been seeing improvements in the overall quality of the tissue on the surface of the wound which is awesome news. In general I think that the patient seems to be continuing to make great progress here. 01/30/2021 upon evaluation today patient's wound is actually doing excellent. There is really not any significant biofilm buildup which is great news and overall I am extremely pleased with where things stand today. There does not appear to be any signs of active infection. No fevers, chills, nausea, vomiting, or diarrhea. 02/06/2021 upon evaluation today patient's wound is actually showing signs of excellent improvement which is great news. We continue to see the benefit of the PuraPly this is doing a great job the wound seems not really irritated whatsoever and is showing signs of good granulation at this point. Overall I am extremely happy with what we are seeing. The patient likewise is happy to hear all of this as well. 02/13/2021 upon evaluation today patient appears to be doing well with regard to  her wound. This again is measuring smaller today and I am very pleased in this regard. Fortunately there does not appear to be any signs of active infection at this time which is good news from a systemic standpoint. Locally there is still some significant drainage which she does have is concerned about infection locally. No fevers, chills, nausea, vomiting, or diarrhea. 02/20/2021 upon evaluation today  patient actually appears to be doing decently well in regard to her wound. She has been tolerating the dressing changes without complication. I do believe the PuraPly is helping wound bed does appear to be doing better. There is no evidence of active infection locally or systemically at this point visually although on fluorescence imaging there still appear to be bacterial load and bioburden noted. 02/27/2021 upon evaluation today patient fortunately continues to show signs of improvement with use of the PuraPly currently. Subsequently we did review her culture results and to be honest I think that the Bactrim is probably the best option to have her continue at this point. For that reason I am get a go ahead and send in a refill today for her for an additional 2 weeks. Nonetheless I think that we are making excellent progress here. It was Enterococcus and Staphylococcus that were noted she is allergic to penicillin so there is not much I can do from the Enterococcus standpoint though the staff does seem to be sensitive to the Bactrim. 03/06/2021 upon evaluation today patient appears to be doing well with regard to her wound. This is definitely showing signs of improvement which is great news and overall I am extremely pleased with where things stand today. There does not appear to be any signs of active infection which is great news and overall and I do believe that we are headed in the appropriate direction I think the PuraPly is doing an awesome job for her. 03/20/2021 upon evaluation today patient actually appears to be doing well with regard to her wound. We have been using the PuraPly although last week when I was out of the office they actually just used endoform nonetheless this still seems to be doing great. Fortunately there does not appear to be any signs of active infection which is great news overall I think the patient is making wonderful progress. 03/27/2021 upon evaluation today patient  appears to be doing well with regard to her wound in fact there is a little drainage but otherwise her does not appear to be any major signs of open wounds nor infection at this time. Overall I am extremely pleased with where things stand. No fevers, chills, nausea, vomiting, or diarrhea. 04/03/2021 upon evaluation today patient presents for reevaluation here in the clinic and overall she seems to be doing quite well. Fortunately there does not appear to be any signs of active infection which is great news and in general I am very pleased with how things appear. There is still a very small opening remaining but in general she is looking much better. Fortunately I do not think there is any need for the PuraPly at this point. 04/10/2021 upon evaluation today patient's wound is actually showing signs of excellent improvement. I am actually very pleased with where it stands today. I think she is very close to complete closure in fact this may be healed today. Either way I think that she may benefit from using her own compression socks for the next week and then will subsequently see how things stand following. Electronic Signature(s) Signed: 04/10/2021  11:04:54 AM By: [name] PA-C Entered By: [name] on 04/10/2021 11:04:53 -------------------------------------------------------------------------------- Physical Exam Details Patient Name: Date of Service: [facility] [name], [name]. 04/10/2021 9:45 A M Medical Record Number: [id number] Patient Account Number: [id number] Date of Birth/Sex: Treating RN: 15-Dec-1955 (66 y.o. [name] Provider: [name] Other Clinician: Referring Provider: Treating Provider/Extender: [name] in Treatment: 79 Constitutional Well-nourished and well-hydrated in no acute distress. Respiratory normal breathing without difficulty. Psychiatric this patient is able to make decisions and demonstrates good insight  into disease process. Alert and Oriented x 3. pleasant and cooperative. Notes Patient's wound again seem to show signs of complete epithelization I did not see any specific wound opening there was a small pinpoint area right in the middle where there might of been some drainage but I think this was just a little fluid trapped underneath some dried drainage and this is actually healed I do believe. Nonetheless at this point my suggestion is probably can to be to use her own compression sock with a protective silicone border foam dressing for the next week and see how you are doing. Electronic Signature(s) Signed: 04/10/2021 11:05:33 AM By: [name] PA-C Entered By: [name] on 04/10/2021 11:05:32 -------------------------------------------------------------------------------- Physician Orders Details Patient Name: Date of Service: [name], [name]. 04/10/2021 9:45 A M Medical Record Number: [id number] Patient Account Number: [id number] Date of Birth/Sex: Treating RN: Dec 16, 1955 (65 y.o. [name] Primary Care Provider: [name] Other Clinician: Referring Provider: Treating Provider/Extender: [name] in Treatment: 31 Verbal / Phone Orders: No Diagnosis Coding ICD-10 Coding Code Description [phone] Chronic venous hypertension (idiopathic) with ulcer and inflammation of left lower extremity L97.828 Non-pressure chronic ulcer of other part of left lower leg with other specified severity L97.328 Non-pressure chronic ulcer of left ankle with other specified severity Follow-up Appointments ppointment in 1 week. Margarita Grizzle Return A Bathing/ Shower/ Hygiene May shower and wash wound with soap and water. Edema Control - Lymphedema / SCD / Other Elevate legs to the level of the heart or above for 30 minutes daily and/or when sitting, a frequency of: - throughout the day Avoid standing for long periods of time. Exercise regularly Compression  stocking or Garment 20-30 mm/Hg pressure to: - bilateral legs Additional Orders / Instructions Follow Nutritious Diet Wound Treatment Wound #15 - Malleolus Wound Laterality: Left, Medial Cleanser: Soap and Water Every Other Day/30 Days Discharge Instructions: May shower and wash wound with dial antibacterial soap and water prior to dressing change. Secondary Dressing: Zetuvit Plus Silicone Border Dressing 4x4 (in/in) Every Other Day/30 Days Discharge Instructions: Apply silicone border over primary dressing as directed. Electronic Signature(s) Signed: 04/10/2021 5:39:20 PM By: [name] Signed: 04/18/2021 5:50:22 PM By: [name] PA-C Entered By: [name] on 04/10/2021 10:40:27 -------------------------------------------------------------------------------- Problem List Details Patient Name: Date of Service: [name], [name]. 04/10/2021 9:45 A M Medical Record Number: [id number] Patient Account Number: [id number] Date of Birth/Sex: Treating RN: December 22, 1955 (65 y.o. [name] Provider: [name] Other Clinician: Referring Provider: Treating Provider/Extender: [name] in Treatment: 69 Active Problems ICD-10 Encounter Code Description Active Date MDM Diagnosis I87.332 Chronic venous hypertension (idiopathic) with ulcer and inflammation of left 01/17/2020 No Yes lower extremity L97.828 Non-pressure chronic ulcer of other part of left lower leg with other specified 01/17/2020 No Yes severity L97.328 Non-pressure chronic ulcer  of left ankle with other specified severity 01/17/2020 No Yes Inactive Problems ICD-10 Code Description Active Date Inactive Date L03.116 Cellulitis of left lower limb 01/31/2020 01/31/2020 T81.31XD Disruption of external operation (surgical) wound, not elsewhere classified, subsequent 01/17/2020 01/17/2020 encounter Resolved Problems Electronic Signature(s) Signed: 04/10/2021 9:59:51 AM By:  [name] PA-C Entered By: [name] on 04/10/2021 09:59:50 -------------------------------------------------------------------------------- Progress Note Details Patient Name: Date of Service: [facility] [name], [name]. 04/10/2021 9:45 A M Medical Record Number: [id number] Patient Account Number: [id number] Date of Birth/Sex: Treating RN: 03/24/1956 (65 y.o. [name] Provider: [name] Other Clinician: Referring Provider: Treating Provider/Extender: [name] in Treatment: 5 Subjective Chief Complaint Information obtained from Patient patient is been followed long-term in this clinic for venous insufficiency ulcers with inflammation, hypertension and ulceration over the medial ankle bilaterally. 01/17/2020; this is a patient who is here for review of postoperative wounds on the left lateral ankle and recurrence of venous stasis ulceration on the left medial History of Present Illness (HPI) the remaining wound is over the left medial ankle. Similar wound over the right medial ankle healed largely with use of Apligraf. Most recently we have been using Hydrofera Blue over this wound with considerable improvement. The patient has been extensively worked up in the past for her venous insufficiency and she is not a candidate for antireflux surgery although I have none of the details available currently. 08/24/14; considerable improvement today. About 50% of this wound areas now epithelialized. The base of the wound appears to be healthier granulation.as opposed to last week when she had deteriorated a considerable improvement 08/17/14; unfortunately the wound has regressed somewhat. The areas of epithelialization from the superior aspect are not nearly as healthy as they were last week. The patient thinks her Hydrofera Blue slipped. 09/07/14; unfortunately the area has markedly regressed in the 2 weeks since I've seen this. There is an  odor surrounding erythema. The healthy granulation tissue that we had at the base of the wound now is a dusky color. The nurse reports green drainage 09/14/14; the area looks somewhat better than last week. There is less erythema and less drainage. The culture I did did not show any growth. Nevertheless I think it is better to continue the Cipro and doxycycline for a further week. The remaining wound area was debridement. 09/21/14. Wound did not require debridement last week. Still less erythema and less drainage. She can complete her antibiotics. The areas of epithelialization in the superior aspect of the wound do not look as healthy as they did some weeks ago 10/05/14 continued improvement in the condition of this wound. There is advancing epithelialization. Less aggressive debridement required 10/19/14 continued improvement in the condition and volume of this wound. Less aggressive debridement to the inferior part of this to remove surface slough and fibrinous eschar 11/02/14 no debridement is required. The surface granulation appears healthy although some of her islands of epithelialization seem to have regressed. No evidence of infection 11/16/14; lites surface debridement done of surface eschar. The wound does not look to be unhealthy. No evidence of infection. Unfortunately the patient has had podiatry issues in the right foot and for some reason has redeveloped small surface ulcerations in the medial right ankle. Her original presentation involved wounds in this area 11/23/14 no debridement. The area on the right ankle has enlarged. The left ankle wound appears stable in terms of the surface although there is periwound inflammation. There has  been regression in the amount of new skin 11/30/14 no debridement. Both wound areas appear healthy. There was no evidence of infection. The the new area on the right medial ankle has enlarged although that both the surfaces appear to be stable. 12/07/14;  Debridement of the right medial ankle wound. No no debridement was done on the left. 12/14/14 no major change in and now bilateral medial ankle wounds. Both of these are very painful but the no overt evidence of infection. She has had previous venous ablation 12/21/14; patient states that her right medial ankle wound is considerably more painful last week than usual. Her left is also somewhat painful. She could not tolerate debridement. The right medial ankle wound has fibrinous surface eschar 12/28/14 this is a patient with severe bilateral venous insufficiency ulcers. For a considerable period of time we actually had the one on the right medial ankle healed however this recently opened up again in June. The left medial ankle wound has been a refractory area with some absent flows. We had some success with Hydrofera Blue on this area and it literally closed by 50% however it is recently opened up Foley. Both of these were debridement today of surface eschar. She tolerates this poorly 01/25/15: No change in the status of this. Thick adherent escar. Very poor tolerance of any attempt at debridement. I had healed the right medial malleolus wound for a considerable amount of time and had the left one down to about 50% of the volume although this is totally regressed over the last 48 weeks. Further the right leg has reopened. she is trying to make a appointment with pain and vascular, previous ablations with Dr. Aleda Grana. I do not believe there is an arterial insufficiency issue here 02/01/15 the status of the adherent eschar bilaterally is actually improved. No debridement was done. She did not manage to get vascular studies done 02/08/15 continued debridement of the area was done today. The slough is less adherent and comes off with less pressure. There is no surrounding infection peripheral pulses are intact 02/15/15 selective debridement with a disposable curette. Again the slough is less adherent and  comes off with less difficulty. No surrounding infection peripheral pulses are intact. 02/22/15 selective debridement of the right medial ankle wound. Slough comes off with less difficulty. No obvious surrounding infection peripheral pulses are intact I did not debridement the one on the left. Both of these are stable to improved 03/01/15 selective debridement of both wound areas using a curette to. Adherent slough cup soft with less difficulty. No obvious surrounding infection. The patient tells me that 2 days ago she noted a rash above the right leg wrap. She did not have this on her lower legs when she change this over she arrives with widespread left greater than right almost folliculitis-looking rash which is extremely pruritic. I don't see anything to culture here. There is no rash on the rest of her body. She feels well systemically. 03/08/15; selective debridement of both wounds using a curette. Base of this does not look unhealthy. She had limegreen drainage coming out of the left leg wound and describes a lot of drainage. The rash on her left leg looks improved to. No cultures were done. 03/22/15; patient was not here last week. Basal wounds does not look healthy and there is no surrounding erythema. No drainage. There is still a rash on the left leg that almost looks vasculitic however it is clearly limited to the top of where the wrap  would be. 04/05/15; on the right required a surgical debridement of surface eschar and necrotic subcutaneous tissue. I did not debridement the area on the left. These continue to be large open wounds that are not changing that much. We were successful at one point in healing the area on the right, and at the same time the area on the left was roughly half the size of current measurements. I think a lot of the deterioration has to do with the prolonged time the patient is on her feet at work 04/19/15 I attempted-like surface debridement bilaterally she does not  tolerate this. She tells me that she was in allergic care yesterday with extreme pain over her left lateral malleolus/ankle and was told that she has an "sprain" 05/03/15; large bilateral venous insufficiency wounds over the medial malleolus/medial aspect of her ankles. She complains of copious amounts of drainage and his usual large amounts of pain. There is some increasing erythema around the wound on the right extending into the medial aspect of her foot to. historically she came in with these wounds the right one healed and the left one came down to roughly half its current size however the right one is reopened and the left is expanded. 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 05/10/15 large bilateral venous insufficiency wounds. There is less adherence surface left however the surface culture that I did last week grew pseudomonas therefore bilateral selective debridement score necessary. There is surrounding erythema. The patient describes severe bilateral drainage and a lot of pain in the left ankle. Apparently her podiatrist was were ready to do a cortisone shot 05/17/15; the patient complains of pain and again copious amounts of drainage. 05/24/15; we used Iodo flex last week. Patient notes considerable improvement in wound drainage. Only needed to change this once. 05/31/15; we continued Iodoflex; the base of these large wounds bilaterally is not too bad but there is probably likely a significant bioburden here. I would like to debridement just doesn't tolerate it. 06/06/14 I would like to continue the Iodoflex although she still hasn't managed to obtain supplies. She has bilateral medial malleoli or large wounds which are mostly superficial. Both of them are covered circumferentially with some nonviable fibrinous slough although she tolerates debridement very poorly. She apparently has an appointment for an ablation on the right leg by  interventional radiology. 06/14/15; the patient arrives with the wounds and static condition. We attempted a debridement although she does not do well with this secondary to pain. I 07/05/15; wounds are not much smaller however there appears to be a cleaner granulating base. The left has tight fibrinous slough greater than the right. Debridement is tolerated poorly due to pain. Iodoflex is done more for these wounds in any of the multitude of different dressings I have tried on the left 1 and then subsequently the right. 07/12/15; no change in the condition of this wound. I am able to do an aggressive debridement on the right but not the left. She simply cannot tolerate it. We have been using Iodoflex which helps somewhat. It is worthwhile remembering that at one point we healed the right medial ankle wound and the left was about 25% of the current circumference. We have suggested returning to vascular surgery for review of possible further ablations for one reason or another she has not been able to do this. 07/26/15 no major change in the condition of either  wound on her medial ankle. I did not attempt to debridement of these. She has been aggressively scrubbing these while she is in the shower at home. She has her supply of Iodoflex which seems to have done more for these wounds then anything I have put on recently. 08/09/15 wound area appears larger although not verified by measurements. Using Iodoflex 09/05/2015 -- she was here for avisit today but had significant problems with the wound and I was asked to see her for a physician opinion. I have summarize that this lady has had surgery on her left lower extremity about 10 years ago where the possible veins stripping was done. She has had an opinion from interventional radiology around November 2016 where no further sclerotherapy was ordered. The patient works 12 hours a day and stands on a concrete floor with work boots and is unable to get the proper  compression she requires and cannot elevate her limbs appropriately at any given time. She has recently grown Pseudomonas from her wound culture but has not started her ciprofloxacin which was called in for her. 09/13/15 this continues to be a difficult situation for this patient. At one point I had this wound down to a 1.5 x 1.5" wound on her left leg. This is deteriorated and the right leg has reopened. She now has substantial wounds on her medial calcaneus, malleoli and into her lower leg. One on the left has surface eschar but these are far too painful for me to debridement here. She has a vascular surgery appointment next week to see if anything can be done to help here. I think she has had previous ablations several years ago at [facility]. She has no major edema. She tells me that she did not get product last time [facility]] and went for several days without it. She continues to work in work boots 12 hours a day. She cannot get compression/4-layer under her work boots. 09/20/15 no major change. Periwound edema control was not very good. Her point with pain and vascular is next Wednesday the 25th 09/28/15; the patient is seen vascular surgery and is apparently scheduled for repeat duplex ultrasounds of her bilateral lower legs next week. 10/05/15; the patient was seen by Dr. Doren Custard of vascular surgery. He feels that she should have arterial insufficiency excluded as cause/contributed to her nonhealing stage she is therefore booked for an arteriogram. She has apparently monophasic signals in the dorsalis pedis pulses. She also of course has known severe chronic venous insufficiency with previous procedures as noted previously. I had another long discussion with the patient today about her continuing to work 12 hour shifts. I've written her out for 2 months area had concerns about this as her work location is currently undergoing significant turmoil and this may lead to her termination. She is aware  of this however I agree with her that she simply cannot continue to stand for 12 hours multiple days a week with the substantial wound areas she has. 10/19/15; the Dr. Doren Custard appointment was largely for an arteriogram which was normal. She does not have an arterial issue. He didn't make a comment about her chronic venous insufficiency for which she has had previous ablations. Presumably it was not felt that anything additional could be done. The patient is now out of work as I prescribed 2 weeks ago. Her wounds look somewhat less aggravated presumably because of this. I felt I would give debridement another try today 10/25/15; no major change in this patient's  wounds. We are struggling to get her product that she can afford into her own home through her insurance. 11/01/15; no major change in the patient's wounds. I have been using silver alginate as the most affordable product. I spoke to Dr. Marla Roe last week with her requested take her to the OR for surgical debridement and placement of ACEL. Dr. Marla Roe told me that she would be willing to do this however [facility] will not cover this, fortunately the patient has [place] [employer] of some variant 11/08/15; no major change in the patient's wounds. She has been completely nonviable surface that this but is in too much pain with any attempted debridement are clinic. I have arranged for her to see Dr. Marla Roe ham of plastic surgery and this appointment is on Monday. I am hopeful that they will take her to the OR for debridement, possible ACEL ultimately possible skin graft 11/22/15 no major change in the patient's wounds over her bilateral medial calcaneus medial malleolus into the lower legs. Surface on these does not look too bad however on the left there is surrounding erythema and tenderness. This may be cellulitis or could him sleepy tinea. 11/29/15; no major changes in the patient's wounds over her bilateral medial malleolus. There  is no infection here and I don't think any additional antibiotics are necessary. There is now plan to move forward. She sees Dr. Marla Roe in a week's time for preparation for operative debridement and ACEL placement I believe on 7/12. She then has a follow-up appointment with Dr. Marla Roe on 7/21 12/28/15; the patient returns today having been taken to the [facility] by Dr. Marla Roe 12/12/15 she underwent debridement, intraoperative cultures [which were negative]. She had placement of a wound VAC. Parent really ACEL was not available to be placed. The wound VAC foam apparently adhered to the wound since then she's been using silver alginate, Xeroform under Ace wraps. She still says there is a lot of drainage and a lot of pain 01/31/16; this is a patient I see monthly. I had referred her to Dr. Marla Roe him of plastic surgery for large wounds on her bilateral medial ankles. She has been to the OR twice once in early July and once in early August. She tells me over the last 3 weeks she has been using the wound VAC with ACEL underneath it. On the right we've simply been using silver alginate. Under Kerlix Coban wraps. 02/28/16; this is a patient I'm currently seeing monthly. She is gone on to have a skin graft over her large venous insufficiency ulcer on the left medial ankle. This was done by Dr. Marla Roe him. The patient is a bit perturbed about why she didn't have one on her right medial ankle wound. She has been using silver alginate to this. 03/06/16; I received a phone call from her plastic surgery Dr. Marla Roe. She expressed some concern about the viability of the skin graft she did on the left medial ankle wound. Asked me to place Endoform on this. She told me she is not planning to do a subsequent skin graft on the right as the left one did not take very well. I had placed Hydrofera Blue on the right 03/13/16; continue to have a reasonably healthy wound on the right medial ankle. Down to 3 mm in  terms of size. There is epithelialization here. The area on the left medial ankle is her skin graft site. I suppose the last week this looks somewhat better. She has an open  area inferiorly however in the center there appears to be some viable tissue. There is a lot of surface callus and eschar that will eventually need to come off however none of this looked to be infected. Patient states that the is able to keep the dressing on for several days which is an improvement. 03/20/16 no major change in the circumference of either wound however on the left side the patient was at Dr. Eusebio Friendly office and they did a debridement of left wound. 50% of the wound seems to be epithelialized. I been using Endoform on the left Hydrofera Blue in the right 03/27/16; she arrives today with her wound is not looking as healthy as they did last week. The area on the right clearly has an adherent surface to this a very similar surface on the left. Unfortunately for this patient this is all too familiar problem. Clearly the Endoform is not working and will need to change that today that has some potential to help this surface. She does not tolerate debridement in this clinic very well. She is changing the dressing wants 04/03/16; patient arrives with the wounds looking somewhat better especially on the right. Dr. Migdalia Dk change the dressing to silver alginate when she saw her on Monday and also sold her some compression socks. The usefulness of the latter is really not clear and woman with severely draining wounds. 04/10/16; the patient is doing a bit of an experiment wearing the compression stockings that Dr. Migdalia Dk provided her to her left leg and the out of legs based dressings that we provided to the right. 05/01/16; the patient is continuing to wear compression stockings Dr. Migdalia Dk provided her on the left that are apparently silver impregnated. She has been using Iodoflex to the right leg wound. Still a moderate  amount of drainage, when she leaves here the wraps only last for 4 days. She has to change the stocking on the left leg every night 05/15/16; she is now using compression stockings bilaterally provided by Dr. Marla Roe. She is wearing a nonadherent layer over the wounds so really I don't think there is anything specific being done to this now. She has some reduction on the left wound. The right is stable. I think all healing here is being done without a specific dressing 06/09/16; patient arrives here today with not much change in the wound certainly in diameter to large circular wounds over the medial aspect of her ankle bilaterally. Under the light of these services are certainly not viable for healing. There is no evidence of surrounding infection. She is wearing compression stockings with some sort of silver impregnation as prescribed by Dr. Marla Roe. She has a follow-up with her tomorrow. 06/30/16; no major change in the size or condition of her wounds. These are still probably covered with a nonviable surface. She is using only her purchase stockings. She did see Dr. Marla Roe who seemed to want to apply Dakin's solution to this I'm not extreme short what value this would be. I would suggest Iodoflex which she still has at home. 07/28/16; I follow Mrs. Kamen episodically along with Dr. Marla Roe. 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 She has been applying a topical collagen ointment to both wounds with Adaptic. I don't think Dr. Marla Roe is planning to take her back to the OR. 08/19/16; I follow Mrs. Jeneen Rinks on a monthly basis along with Dr. Marla Roe of plastic surgery. She has very refractory venous insufficiency wounds  on the bilateral medial lower legs left greater than right. I been following her for a number of years. At one point I was able to get the right medial malleolus wound to heal and had the left medial  malleolus down to about half its current size however and I had to send her to plastic surgery for an operative debridement. Since then things have been stable to slightly improve the area on the right is slightly better one in the left about the same although there is much less adherent surface than I'm used to with this patient. She is using some form of liquid collagen gel that Dr. Marla Roe provided a Kerlix cover with the patient's own pressure stockings. She tells me that she has extreme pain in both ankles and along the lateral aspect of both feet. She has been unable to work for some period of time. She is telling me she is retiring at the beginning of April. She sees Dr. Doran Durand of orthopedics next week 09/22/16; patient has not seen Dr. Marla Roe since the last time she is here. I'm not really sure what she is using to the wounds other than bits and pieces of think she had left over including most recently Hydrofera Blue. She is using juxtalite stockings. She is having difficulty with her husband's recent illness "stroke". She is having to transport him to various doctors appointments. Dr. Marla Roe left her the option of a repeat debridement with ACEL however she has not been able to get the time to follow-up on this. She continues to have a fair amount of drainage out of these wounds with certainly precludes leaving dressings on all week 10/13/16; patient has not seen Dr. Marla Roe since she was last in our clinic. I'm not really sure what she is doing with the wounds, we did try to get her [facility] and I think she is actually using this most of the time. Because of drainage she states she has to change this every second day although this is an improvement from what she used to do. She went to see Dr. Doran Durand who did not think she had a muscular issue with regards to her feet, he referred her to a neurologist and I think the appointment is sometime in June. I changed her back to  Iodoflex which she has used in the past but not recently. 11/03/16; the patient has been using Iodoflex although she ran out of this. Still claims that there is a lot of drainage although the wound does not look like this. No surrounding erythema. She has not been back to see Dr. Marla Roe 11/24/16; the patient has been using Iodoflex again but she ran out of it 2 or 3 days ago. There is no major change in the condition of either one of these wounds in fact they are larger and covered in a thick adherent surface slough/nonviable tissue especially on the left. She does not tolerate mechanical debridement in our clinic. Going back to see Dr. Marla Roe of plastic surgery for an operative debridement would seem reasonable. 12/15/16; the patient has not been back to see Dr. Marla Roe. She is been dealing with a series of illnesses and her husband which of monopolized her time. She is been using Sorbact which we largely supplied. She states the drainage is bad enough that it maximum she can go 2-3 days without changing the dressing 01/12/2017 -- the patient has not been back for about 4 weeks and has not seen Dr. Marla Roe not does  she have any appointment pending. 01/23/17; patient has not seen Dr. Marla Roe even though I suggested this previously. She is using Santyl that was suggested last week by Dr. Con Memos this Cost her $16 through her insurance which is indeed surprising 02/12/17; continuing Santyl and the patient is changing this daily. A lot of drainage. She has not been back to see plastic surgery she is using an Ace wrap. Our intake nurse suggested wrap around stockings which would make a good reasonable alternative 02/26/17; patient is been using Santyl and changing this daily due to drainage. She has not been to see plastic surgery she uses in April Ace wrap to control the edema. She did obtain extremitease stockings but stated that the edema in her leg was to big for these 03/20/17; patient is  using Santyl and Anasept. Surfaces looked better today the area on the right is actually measuring a little smaller. She has states she has a lot of pain in her feet and ankles and is asking for a consult to pain control which I'll try to help her with through our case manager. 04/10/17; the patient arrives with better-looking wound surfaces and is slightly smaller wound on the left she is using a combination of Santyl and Anasept. She has an appointment or at least as started in the pain control center associated with [place] [facility] 05/14/17; this is a patient who I followed for a prolonged period of time. She has venous insufficiency ulcers on her bilateral medial ankles. At one point I had this down to a much smaller wound on the left however these reopened and we've never been able to get these to heal. She has been using Santyl and Anasept gel although 2 weeks ago she ran out of the Anasept gel. She has a stable appearance of the wound. She is going to the wound care clinic at [facility]. They wanted do a nerve block/spinal block although she tells me she is reluctant to go forward with that. 05/21/17; this is a patient I have followed for many years. She has venous insufficiency ulcers on her bilateral medial ankles. Chronic pain and deformity in her ankles as well. She is been to see plastic surgery as well as orthopedics. Using PolyMem AG most recently/Kerramax/ABDs and 2 layer compression. She has managed to keep this on and she is coming in for a nurse check to change the dressing on Tuesdays, we see her on Fridays 06/05/17; really quite a good looking surface and the area especially on the right medial has contracted in terms of dimensions. Well granulated healthy-looking tissue on both sides. Even with an open curet there is nothing that even feels abnormal here. This is as good as I've seen this in quite some time. We have been using PolyMem AG and bringing her in for a nurse  check 06/12/17; really quite good surface on both of these wounds. The right medial has contracted a bit left is not. We've been using PolyMem and AG and she is coming in for a nurse visit 06/19/17; we have been using PolyMem AG and bringing her in for a nurse check. Dimensions of her wounds are not better but the surfaces looked better bilaterally. She complained of bleeding last night and the left wound and increasing pain bilaterally. She states her wound pain is more neuropathic than just the wounds. There was some suggestion that this was radicular from her pain management doctor in talking to her it is really difficult to sort this  out. 06/26/17; using PolyMem and AG and bringing her in for a nurse check as All of this and reasonably stable condition. Certainly not improved. The dimensions on the lateral part of the right leg look better but not really measuring better. The medial aspect on the left is about the same. 07/03/16; we have been using PolyMen AG and bringing her in for a nurse check to change the dressings as the wounds have drainage which precludes once weekly changing. We are using all secondary absorptive dressings.our intake nurse is brought up the idea of using a wound VAC/snap VAC on the wound to help with the drainage to see if this would result in some contraction. This is not a bad idea. The area on the right medial is actually looking smaller. Both wounds have a reasonable-looking surface. There is no evidence of cellulitis. The edema is well controlled 07/10/17; the patient was denied for a snap VAC by her insurance. The major issue with these wounds continues to be drainage. We are using wicked PolyMem AG and she is coming in for a nurse visit to change this. The wounds are stable to slightly improved. The surface looks vibrant and the area on the right certainly has shrunk in size but very slowly 07/17/17; the patient still has large wounds on her bilateral medial malleoli.  Surface of both of these wounds looks better. The dimensions seem to come and go but no consistent improvement. There is no epithelialization. We do not have options for advanced treatment products due to insurance issues. They did not approve of the wound VAC to help control the drainage. More recently we've been using PolyMem and AG wicked to allow drainage through. We have been bringing her in for a nurse visit to change this. We do not have a lot of options for wound care products and the home again due to insurance issues 07/24/17; the patient's wound actually looks somewhat better today. No drainage measurements are smaller still healthy-looking surface. We used silver collagen under PolyMen started last week. We have been bringing her in for a dressing change 07/31/17; patient's wound surface continued to look better and I think there is visible change in the dimensions of the wound on the right. Rims of epithelialization. We have been using silver collagen under PolyMen and bringing her in for a dressing change. There appears to be less drainage although she is still in need of the dressing change 08/07/17. Patient's wound surface continues to look better on both sides and the area on the right is definitely smaller. We have been using silver collagen and PolyMen. She feels that the drainage has been it has been better. I asked her about her vascular status. She went to see Dr. Aleda Grana at [facility] and had some form of ablation. I don't have much detail on this. I haven't my notes from 2016 that she was not a candidate for any further ablation but I don't have any more information on this. We had referred her to vein and vascular I don't think she ever went. He does not have a history of PAD although I don't have any information on this either. We don't even have ABIs in our record 08/14/17; we've been using silver collagen and PolyMen cover. And putting the patient and compression. She we  are bringing her in as a nurse visit to change this because ofarge amount of drainage. We didn't the ABIs in clinic today since they had been done in many moons  1.2 bilaterally. She has been to see vein and vascular however this was at [facility] and she had ablation although I really don't have any information on this all seemed biking get a report. She is also been operatively debrided by plastic surgery and had a cell placed probably 8-12 months ago. This didn't have a major effect. We've been making some gains with current dressings 08/19/17-She is here in follow-up evaluation for bilateral medial malleoli ulcers. She continues to tolerate debridement very poorly. We will continue with recently changed topical treatment; if no significant improvement may consider switching to Iodosorb/Iodoflex. 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 She has been using silver collagen and PolyMem. I believe she has been used and tried on Iodoflex before. 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 These have been very refractory over time. We have been using silver collagen and PolyMen. She is coming in once a week for a doctor's and once a week for nurse visits. We are actually making some progress 09/18/17; the patient's wounds are smaller especially on the right medial. She arrives today to upset to consider even washing these off with Anasept which I think is been part of the reason this is been closing. We've been using collagen covered in PolyMen otherwise. It is noted that she has a small area of folliculitis on the right medial calf that. As we are wrapping her legs I'll give her a short course of doxycycline to make sure this doesn't amount to anything. She is a  long list of complaints today including imbalance, shortness of breath on exertion, inversion of her left ankle. With regards to the latter complaints she is been to see orthopedics and they offered her a tendon release surgery I believe but wanted her wounds to be closed first. I have recommended she go see her primary physician with regards to everything else. 09/25/17; patient's wounds are about the same size. We have made some progress bilaterally although not in recent weeks. She will not allow me T wash these wounds with Anasept even if she is doing her cell. Wheeze we've been using collagen covered in PolyMen. Last week she had a small area of folliculitis this is now opened into a small wound. She completed 5 days of trimethoprim sulfamethoxazole 10/02/17; unfortunately the area on her left medial ankle is worse with a larger wound area towards the Achilles. The patient complains of a lot of pain. She will not allow debridement although visually I don't think there is anything to debridement in any case. We have been using silver collagen and PolyMen for several months now. Initially we are making some progress although I'm not really seeing that today. We will move back to [facility]. His admittedly this is a bit of a repeat however I'm hoping that his situation is different now. The patient tells me she had her leg on the left give out on her yesterday this is process some pain. 10/09/17; the patient is seen twice a week largely because of drainage issues coming out of the chronic medial bimalleolar wounds that are chronic. Last week the dimensions of the one on the left looks a little larger I changed her to [facility]. She comes in today with a history of terrible pain in  the bilateral wound areas. She will not allow debridement. She will not even allow a tissue culture. There is no surrounding erythema no no evidence of cellulitis. We have been putting her Kerlix Coban man. She will  not allow more aggressive compression as there was a suggestion to put her in 3 layer wraps. 10/16/17; large wounds on her bilateral medial malleoli. These are chronic. Not much change from last week. The surface looks have healthy but absolutely no epithelialization. A lot of pain little less so of drainage. She will not allow debridement or even washing these off in the vigorous fashion with Anasept. 10/23/17; large wounds on her bilateral malleoli which are chronic. Some improvement in terms of size perhaps on the right since last time I saw these. She states that after we increased the 3 layer compression there was some bleeding, when she came in for a nurse visit she did not want 3 layer compression put back on about our nurse managed to convince her. She has known chronic venous visit issues and I'm hoping to get her to tolerate the 3 layer compression. using Hydrofera Blue 10/30/17; absolutely no change in the condition of either wound although we've had some improvement in dimensions on the right.. Attempted to put her in 3 layer compression she didn't tolerated she is back in 2 layer compression. We've been using Hydrofera Blue We looked over her past records. She had venous reflux studies in November 2016. There was no evidence of deep venous reflux on the right. Superficial vein did not show the greater saphenous vein at think this is been previously ablated the small saphenous vein was within normal limits. The left deep venous system showed no DVT the vessels were positive for deep venous reflux in the posterior tibial veins at the ankle. The greater saphenous vein was surgically absent small saphenous vein was within normal limits. She went to vein and vascular at [facility]. I believe she had an ablation on the left greater saphenous vein. I'll update her reflux studies perhaps ever reviewed by vein and vascular. We've made absolutely no progress in these wounds. Will also try to read  and TheraSkins through her insurance 11/06/17; W the patient apparently has a 2 week follow-up with vein and vascular I like him to review the whole issue with regards to her previous vascular workup by Dr. Aleda Grana. We've really made no progress on these wounds in many months. She arrives today with less viable looking surface on the left medial ankle wound. This was apparently looking about the same on Tuesday when she was here for nurse visit. 11/13/17; deep tissue culture I did last time of the left lower leg showed multiple organisms without any predominating. In particular no Staphylococcus or group A strep were isolated. We sent her for venous reflux studies. She's had a previous left greater saphenous vein stripping and I think sclerotherapy of the right greater saphenous vein. She didn't really look at the lesser saphenous vein this both wounds are on the medial aspect. She has reflux in the common femoral vein and popliteal vein and an accessory vein on the right and the common femoral vein and popliteal vein on the left. I'm going to have her go to see vein and vascular just the look over things and see if anything else beside aggressive compression is indicated here. We have not been able to make any progress on these wounds in spite of the fact that the surface of the wounds is  never look too bad. 11/20/17; no major change in the condition of the wounds. Patient reports a large amount of drainage. She has a lot of complaints of pain although enlisting her today I wonder if some of this at least his neuropathic rather than secondary to her wounds. She has an appointment with vein and vascular on 12/30/17. The refractory nature of these wounds in my mind at least need vein and vascular to look over the wounds the recent reflux studies we did and her history to see if anything further can be done here. I also note her gait is deteriorated quite a bit. Looks like she has inversion of her foot  on the right. She has a bilateral Trendelenburg gait. I wonder if this is neuropathic or perhaps multilevel radicular. 11/27/17; her wounds actually looks slightly better. Healthy-looking granulation tissue a scant amount of epithelialization. [place] [facility] will not pay for [employer]. They will play for tri layer Oasis and Dermagraft. This is not a diabetic ulcer. We'll try for the tri layer Oasis. She still complains of some drainage. She has a vein and vascular appointment on 12/30/17 12/04/17; the wounds visually look quite good. Healthy-looking granulation with some degree of epithelialization. We are still waiting for response to our request for trial to try layer Oasis. Her appointment with vascular to review venous and arterial issues isn't sold the end of July 7/31. Not allow debridement or even vigorous cleansing of the wound surface. 12/18/17; slightly smaller especially on the right. 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 We've been using Hydrofera Blue. We still are looking into triple layer Oasis through her insurance 01/08/18 on evaluation today patient's wound actually appears to be showing signs of good improvement at this point in time. She has been tolerating the dressing changes without complication. Fortunately there does not appear to be any evidence of infection at this point in time. We have been utilizing silver nitrate which does seem to be of benefit for her which is also good news. Overall I'm very happy with how things seem to be both regards appearance as well as measurement. Patient did see Dr. Bridgett Larsson for evaluation on 12/30/17. In his assessment he felt that stripping would not likely add much more than chronic compression to the patient's healing process. His recommendation was to follow-up in three months with Dr. Doren Custard if she hasn't healed in order to consider referral back to you and see vascular where she previously was in a trial and was  able to get her wound to heal. I'll be see what she feels she when you staying compression and he reiterated this as well. 01/13/18 on evaluation today patient appears to actually be doing very well in regard to her bilateral medial malleolus ulcers. She seems to have tolerated the chemical cauterization with silver nitrate last week she did have some pain through that evening but fortunately states that I'll be see since it seems to be doing better she is overall pleased with the progress. 01/21/18; really quite a remarkable improvement since I've last seen these wounds. We started using silver nitrate specially on the islands of hyper granulation which for some reason her around the wound circumference. This is really done quite nicely. Primary dressing Hydrofera Blue under 4 layer compression. She seems to be able to hold out without a nurse rewrap. Follow-up in 1 week 01/28/18; we've continued the hydrofera blue but continued with chemical cauterization to the wound area that we started about a month  ago for irregular hyper granulation. She is made almost stunning improvement in the overall wound dimensions. I was not really expecting this degree of improvement in these chronic wounds 02/05/18; we continue with Hydrofera Bluebut of also continued the aggressive chemical cauterization with silver nitrate. We made nice progress with the right greater than left wound. 02/12/18. We continued with Hydrofera Blue after aggressive chemical cauterization with silver nitrate. We appear to be making nice progress with both wound areas 02/19/2018; we continue with [facility] after washing the wounds vigorously with Anasept spray and chemical cauterization with silver nitrate. We are making excellent progress. The area on the right's just about closed 02/26/2018. The area on the left medial ankle had too much necrotic debris today. I used a #5 curette we are able to get most of the soft. I continued with  the silver nitrate to the much smaller wound on the right medial ankle she had a new area on her right lower pretibial area which she says was due to a role in her compression 03/05/2018; both wound areas look healthy. Not much change in dimensions from last week. I continue to use silver nitrate and Hydrofera Blue. The patient saw Dr. Doren Custard of vein and vascular. He felt she had venous stasis ulcers. He felt based on her previous arteriogram she should have adequate circulation for healing. Also she has deep venous reflux but really no significant correctable superficial venous reflux at this time. He felt we should continue with conservative management including leg elevation and compression 04/02/2018; since we last saw this woman about a month ago she had a fall apparently suffered a pelvic fracture. I did not look up the x-[name]. Nevertheless because of pain she literally was bedbound for 2 weeks and had home health coming out to change the dressing. Somewhat predictably this is resulted in considerable improvement in both wound areas. The right is just about closed on the medial malleolus and the left is about half the size. 04/16/2018; both her wounds continue to go down in size. Using Hydrofera Blue. 05/07/18; both her wounds appeared to be improving especially on the right where it is almost closed. We are using Hydrofera Blue 05/14/2018; slightly worse this week with larger wounds. Surface on the left medial not quite as good. We have been using Hydrofera Blue 05/21/18; again the wounds are slightly larger. Left medial malleolus slightly larger with eschar around the circumference. We have been using Hydrofera Blue undergoing a wraps for a prolonged period of time. This got a lot better when she was more recumbent due to a fall and a back injury. I change the primary dressing the silver alginate today. She did not tolerate a 4 layer compression previously although I may need to bring this up with  her next time 05/28/2018; area on the left medial malleolus again is slightly larger with more drainage. Area on the right is roughly unchanged. She has a small area of folliculitis on the right medial just on the lower calf. This does not look ominous. 06/03/2018 left medial malleolus slightly smaller in a better looking surface. We used silver nitrate on this last time with silver alginate. The area on the right appears slightly smaller 1/10; left medial malleolus slightly smaller. Small open area on the right. We used silver nitrate and silver alginate as of 2 weeks ago. We continue with the wound and compression. These got a lot better when she was off her feet 1/17; right medial malleolus wound is  smaller. The left may be slightly smaller. Both surfaces look somewhat better. 1/24; both wounds are slightly smaller. Using silver alginate under Unna boots 1/31; both wounds appear smaller in fact the area on the right medial is just about closed. Surface eschar. We have been using silver alginate under Unna boots. The patient is less active now spends let much less time on her feet and I think this is contributed to the general improvement in the wound condition 2/7; both wounds appear smaller. I was hopeful the right medial would be closed however there there is still the same small open area. Slight amount of surface eschar on the left the dimensions are smaller there is eschar but the wound edges appear to be free. We have been using silver alginate under Unna boot's 2/14; both wounds once again measure smaller. Circumferential eschar on the left medial. We have been using silver alginate under Unna boots with gradual improvement 2/21; the area on the right medial malleolus has healed. The area on the left is smaller. We have been using silver alginate and Unna boots. We can discharge wrapping the right leg she has 20/30 stockings at home she will need to protect the scar tissue in this area 2/28;  the area on the right medial malleolus remains closed the patient has a compression stocking. The area on the left is smaller. We have been using silver alginate and Unna boots. 3/6 the area on the right medial ankle remains closed. Good edema control noted she is using her own compression stocking. The area on the left medial ankle is smaller. We have been managing this with silver alginate and Unna boots which we will continue today. 3/13; the area on the right medial ankle remains closed and I'm declaring it healed today. When necessary the left is about the same still a healthy-looking surface but no major change and wound area. No evidence of infection and using silver alginate under unna and generally making considerable improvement 3/27 the area on the right medial ankle remains closed the area on the left is about the same as last week. Certainly not any worse we have been using silver alginate under an Unna boot 4/3; the area on the right medial ankle remains closed per the patient. We did not look at this wound. The wound on the left medial ankle is about the same surface looks healthy we have been using silver alginate under an Unna boot 4/10; area on the right medial ankle remains closed per the patient. We did not look at this wound. The wound on the left medial ankle is slightly larger. The patient complains that the [facility] caused burning pain all week. She also told us that she was a lot more active this week. Changed her back to silver alginate 4/17; right medial ankle still closed per the patient. Left medial ankle is slightly larger. Using silver alginate. She did not tolerate Hydrofera Blue on this area 4/24; right medial ankle remains closed we have not look at this. The left medial ankle continues to get larger today by about a centimeter. We have been using silver alginate under Unna boots. She complains about 4 layer compression as an alternative. She has been up on her  feet working on her garden 5/8; right medial ankle remains closed we did not look at this. The left medial ankle has increased in size about 100%. We have been using silver alginate under Unna boots. She noted increased pain this week and  was not surprised that the wound is deteriorated 5/15; no major change in SA however much less erythema ( one week of doxy ocellulitis). 5/22-65 year old female returns at 1 week to the clinic for left medial ankle wound for which we have been using silver alginate under 3 layer compression She was placed on DOXY at last visit - the wound is wider at this visit. She is in 3 layer compression 5/29; change to [facility] last week. I had given her empiric doxycycline 2 weeks ago for a week. She is in 3 layer compression. She complains of a lot of pain and drainage on presentation today. 6/5; using Hydrofera Blue. I gave her doxycycline recently empirically for erythema and pain around the wound. Believe her cultures showed enterococcus which not would not have been well covered by doxycycline nevertheless the wound looks better and I don't feel specifically that the enterococcus needs to be covered. She has a new what looks like a wrap injury on her lateral left ankle. 6/12; she is using Hydrofera Blue. She has a new area on the left anterior lower tibial area. This was a wrap injury last week. 6/19; the patient is using Hydrofera Blue. She arrived with marked inflammation and erythema around the wound and tenderness. 12/01/18 on evaluation today patient appears to be doing a little bit better based on what I'm hearing from the standpoint of lassos evaluation to this as far as the overall appearance of the wound is concerned. Then sometime substandard she typically sees Dr. Dellia Nims. Nonetheless overall very pleased with the progress that she's made up to this point. No fevers, chills, nausea, or vomiting noted at this time. 7/10; some improvement in the surface area.  Aggressively debrided last week apparently. I went ahead with the debridement today although the patient does not tolerate this very well. We have been using Iodoflex. Still a fair amount of drainage 7/17; slightly smaller. Using Iodoflex. 7/24; no change from last week in terms of surface area. We have been using Iodoflex. Surface looks and continues to look somewhat better 7/31; surface area slightly smaller better looking surface. We have been using Iodoflex. This is under Unna boot compression 8/7-Patient presents at 1 week with Unna boot and Iodoflex, wound appears better 8/14-Patient presents at 1 week with Iodoflex, we use the Unna boot, wound appears to be stable better.Patient is getting Botox treatment for the inversion of the foot for tendon release, Next week 8/21; we are using Iodoflex. Unna boot. The wound is stable in terms of surface area. Under illumination there is some areas of the wound that appear to be either epithelialized or perhaps this is adherent slough at this point I was not really clear. It did not wipe off and I was reluctant to debride this today. 8/28; we are using Iodoflex in an Unna boot. Seems to be making good improvement. 9/4; using Iodoflex and wound is slightly smaller. 9/18; we are using Iodoflex with topical silver nitrate when she is here. The wound continues to be smaller 10/2; patient missed her appointment last week due to GI issues. She left and Iodoflex based dressing on for 2 weeks. Wound is about the same size about the size of a dime on the left medial lower 10/9 we have been using Iodoflex on the medial left ankle wound. She has a new superficial probable wrap injury on the dorsal left ankle 10/16; we have been using Hydrofera Blue since last week. This is on the left medial ankle  10/23; we have been using Hydrofera Blue since 2 weeks ago. This is on the left medial ankle. Dimensions are better 11/6; using Hydrofera Blue. I think the wound is  smaller but still not closed. Left medial ankle 11/13; we have been using Hydrofera Blue. Wound is certainly no smaller this week. Also the surface not as good. This is the remanent of a very large area on her left medial ankle. 11/20; using Sorbact since last week. Wound was about the same in terms of size although I was disappointed about the surface debris 12/11; 3-week follow-up. Patient was on vacation. Wound is measuring slightly larger we have been using Sorbact. 12/18; wound is about the same size however surface looks better last week after debridement. We have been using Sorbact under compression 1/15 wound is probably twice the size of last time increased in length nonviable surface. We have been using Sorbact. She was running a mild fever and missed her appointment last week 1/22; the wound is come down in size but under illumination still a very adherent debris we have been Hydrofera Blue that I changed her to last week 1/29; dimensions down slightly. We have been using Hydrofera Blue 2/19 dimensions are the same however there is rims of epithelialization under illumination. Therefore more the surface area may be epithelialized 2/26; the patient's wound actually measures smaller. The wound looks healthy. We have been using Hydrofera Blue. I had some thoughts about running Apligraf then I still may do that however this looks so much better this week we will delay that for now 3/5; the wound is small but about the same as last week. We have been using Hydrofera Blue. No debridement is required today. 3/19; the wound is about the size of a dime. Healthy looking wound even under illumination. We have been using Hydrofera Blue. No mechanical debridement is necessary 3/26; not much change from last week although still looks very healthy. We have been using Hydrofera Blue under Unna boots Patient was offered an ankle fusion by podiatry but not until the wound heals with a proceed with  this. 4/9; the patient comes in today with her original wound on the medial ankle looking satisfactory however she has some uncontrolled swelling in the middle part of her leg with 2 new open areas superiorly just lateral to the tibia. I think this was probably a wrap issue. She said she felt uncomfortable during the week but did not call in. We have been using Hydrofera Blue 4/16; the wound on the medial ankle is about the same. She has innumerable small areas superior to this across her mid tibia. I think this is probably folliculitis. She is also been working in the yard doing a lot of sweating 4/30; the patient issue on the upper areas across her mid tibia of all healed. I think this was excessive yard work if I remember. Her wound on the medial ankle is smaller. Some debris on this we have been using Hydrofera Blue under Unna boots 5/7; mid tibia. She has been using Hydrofera Blue under an Unna wrap. She is apparently going for her ankle surgery on June 3 10/28/19-Patient returns to clinic with the ankle wound, we are using Hydrofera Blue under Unna wrap, surgery is scheduled for her left foot for June 3 so she will be back for nurse visit next week READMISSION 01/17/2020 [name] is a 65 year old woman we have had in this clinic for a long period of time with severe venous hypertension  and refractory wounds on her medial lower legs and ankles bilaterally. 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 When she was off her legs for a prolonged period example when she fell and suffered a compression fracture things would heal up quite nicely. She is now retired and we managed to heal up the right medial leg wound. The left one was very tiny last time I saw this although still refractory. 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 It got to the point where this was interfering with her walking and she elected to proceed with a ankle arthrodesis to straighten her her ankle and leave her with a functional outcome for mobilization. The patient was referred to Dr. Doren Custard and really this took some time to arrange. Dr. Doren Custard saw her on 12/07/2019. Once again he verified that she had no arterial issues. She had previously had an angiogram several years ago. Follow-up ABIs on the left showed an ABI of 1.12 with triphasic waveforms and a TBI of 0.92. She is felt to have chronic deep venous insufficiency but I do not think it was felt that anything could be done from about this from an ablation point of view. At the time Dr. Doren Custard saw this patient the wounds actually look closed via the pictures in his clinic. The patient finally underwent her surgery on 12/15/2019. This went reasonably well and there was a good anatomic outcome. She developed a small distal wound dehiscence on the lateral part of the surgical wound. However more problematically she is developed recurrence of the wound on the medial left ankle. There are actually 2 wounds here one in the distal lower leg and 1 pretty much at the level of the medial malleolus. It is a more distal area that is more problematic. She has been using Hydrofera Blue which started on Friday before this she was simply Ace wrapping. There was a culture done that showed Pseudomonas and she is on ciprofloxacin. A recent CNS on 8/11 was negative. The patient reports some pain but I generally think this is improving. She is using a cam boot completely nonweightbearing using a walker for pivot transfers and a wheelchair 8/24; not much improvement unfortunately she has a surgical wound on the lateral part in the venous insufficiency wound medially. The bottom part of the medial insufficiency wound is still necrotic there is exposed tendon here. We have been using  Hydrofera Blue under compression. Her edema control is however better 8/31; patient in for follow-up of his surgical wound on the lateral part of her left leg and chronic venous insufficiency ulcers medially. We put her back in compression last week. She comes in today with a complaint of 3 or 4 days worth of increasing pain. She felt her cam walker was rubbing on the area on the back of her heel. However there is intense erythema seems more likely she has cellulitis. She had 2 cultures done when she was seeing podiatry in the postop. One of them in late July showed Pseudomonas and she received a course of ciprofloxacin the other was negative on 8/11 she is allergic to penicillin with anaphylactoid complaints of hives oral swelling via information in epic 9/9; when I saw this patient last week she had intense anterior erythema around her wound  on the right lateral heel and ankle and also into the right medial heel. Some of this was no doubt drainage and her walker boot however I was convinced she had cellulitis. I gave her Levaquin and Bactrim she is finishing up on this now. She is following up with Dr. [name] he saw her yesterday. He is taken her out of the walking boot of course she is still nonweightbearing. Her x-[name] was negative for any worrisome features such as soft tissue air etc. Things are a lot better this week. She has home health. We have been using Hydrofera Blue under an [employer] which she put back on yesterday. I did not wrap her last week 9/17; her surrounding skin looks a lot better. In fact the area on the left lateral ankle has just a scant amount of eschar. The only remaining wound is the large area on the left medial ankle. Probably about 60% of this is healthy granulation at the surface however she has a significant divot distally. This has adherent debris in it. I been using debridement and silver collagen to try and get this area to fill-in although I do not think we have made  much progress this week 9/24; the patient's wound on the left medial ankle looks a lot better. The deeper divot area distally still requires debridement but this is cleaning up quite nicely we have been using silver collagen. The patient is complaining of swelling in her foot and is worried that that is contributing to the nonhealing of the ankle wound. She is also complaining of numbness in her anterior toes 10/4; left medial ankle. The small area distally still has a divot with necrotic material that I have been debriding away. This has an undermining area. She is approved for Apligraf. She saw Dr. [name] her surgeon on 10/1. I think he declared himself is satisfied with the condition of things. Still nonweightbearing till the end of the month. 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 Her surgical wound is well closed. There is no evidence of infection 10/11; the patient arrived in clinic today with the expectation that we be able to put an Apligraf on this area after debridement however she arrives with a relatively large amount of green drainage on the dressing. The patient states that this started on Friday. She has not been systemically unwell. 10/19; culture I did last week showed both Enterococcus and Pseudomonas. I think this came in separate parts because I stopped her ciprofloxacin I gave her and prescribed her linezolid however now looking at the final culture result this is Pseudomonas which is resistant to quinolones. She has not yet picked up the linezolid apparently phone issues. We are also trying to get a topical antibiotic out of [place] in [place] they can be applied by home health. She is still having green drainage 10/16; the patient has her topical antibiotic from [facility] in [place]. This is a compounded gel with vancomycin and ciprofloxacin and gentamicin. We are applying this on the wound bed with silver alginate over the top  with Unna boot wraps. She arrives in clinic today with a lot less ominous looking drainage although she is only use this topical preparation once the second time today. She sees Dr. [name] her surgeon on Friday she has home health changing the dressing 11/2; still using her compounded topical antibiotic under silver alginate. Surface is cleaning up there is less drainage. We had an Apligraf for her  today and I elected to apply it. A light coating of her antibiotic 04/25/2020 upon evaluation today patient appears to be doing well with regard to her ankle ulcer. There is a little bit of slough noted on the surface of the wound I am can have to perform sharp debridement to clear this away today. With that being said other than that fact overall I feel like she is making progress and we do see some new epithelial growth. There is also some improvement in the depth of the wound and that distal portion. There is little bit of slough there as well. 12/7; 2-week follow-up. Apligraf #3. Dimensions are smaller. Closing in especially inferiorly. Still some surface debris. Still using the [facility] topical antibiotic but I told her that I don't think this needs to be renewed 12/21; 2-week follow-up. Apligraf #4 dimensions are smaller. Nice improvement 06/05/2020; 2-week follow-up. The patient's wound on the left medial ankle looks really excellent. Nice granulation. Advancing epithelialization no undermining no evidence of infection. We would have to reapply for another Apligraf but with the condition of this wound I did not feel strongly about it. We used Hydrofera Blue under the same degree of compression. She follows up with Dr. [name] her surgeon a week Friday 06/13/2020 upon evaluation today patient appears to be doing excellent in regard to her wound. She has been tolerating the dressing changes without complication. Fortunately there is no signs of active infection at this time. No fevers, chills, nausea,  vomiting, or diarrhea. She was using Hydrofera Blue last week. 06/20/2020 06/20/2020 on evaluation today patient actually appears to be doing excellent in regard to her wound. This is measuring better and looking much better as well. She has been using the collagen that seems to be doing better for her as well even though the [facility] was and is not sticking or feeling as rough on her wound. She did see Dr. [name] on Friday he is very pleased he also stated none of the hardware has shifted. That is great news 1/27; the patient has a small clean wound all that is remaining. I agree that this is too small to really consider an Apligraf. Under illumination the surface is looking quite good. We have been using collagen although the dimensions are not any better this week 2/2; the patient has a small clean wound on the left medial ankle. Although this left of her substantial original areas. Measurements are smaller. We have been using polymen Ag under an Haematologist. 2/10; small area on the left medial ankle. This looks clean nothing to debride however dimensions are about the same we have been using polymen I think now for 2 weeks 2/17; not much change in surface area. We have been using polymen Ag without any improvement. 3/17; 1 month follow-up. The patient has been using endoform without any improvement in fact I think this looks worse with more depth and more expansion 3/24; no improvement. Perhaps less debris on the surface. We have been using Sorbact for 1 week 4/4; wound measures larger. She has edema in her leg and her foot which she tells as her wrap came down. We have been using Unna boots. Sorbact of the wound. She has been approved for Apligraf 09/12/2020 upon evaluation today patient appears to be doing well with regard to her wound. We did get the Apligraf reapproved which is great news we have that available for application today. Fortunately there is no signs of infection and overall the  patient seems to be doing great. The wound bed is nice and clean. 4/27; patient presents for her second application of Apligraf. She states over the past week she has been on her feet more often due to being outside in her garden. She has noted more swelling to her foot as a result. She denies increased warmth, pain or erythema to the wound site. 10/10/2020 upon evaluation today patient appears to be doing well with regard to her wound which does not appear to be quite as irritated as last week from what I am hearing. With that being said unfortunately she is having issues with some erythema and warmth to touch as well as an increase size. I do believe this likely is infected. 10/17/2020 upon evaluation today patient appears to be doing excellent in regard to her wound this is significantly improved as compared to last week. Fortunately I think that the infection is much better controlled at this point. She did have evidence of both Enterococcus as well as Staphylococcus noted on culture. Enterococcus really would not be helped significantly by the Cipro but the wound is doing so much better I am under the assumption that the Staphylococcus is probably the main organism that is causing the current infection. Nonetheless I think that she is doing excellent as far as that is concerned and I am very pleased in that regard. I would therefore recommend she continue with the Cipro. 10/31/2020 upon evaluation today patient appears to be doing well with regard to her wound. She has been tolerating the dressing changes without complication. Fortunately there is no signs of active infection and overall I am extremely pleased with where things stand today. No fevers, chills, nausea, vomiting, or diarrhea. With that being said she does have some green drainage coming from the wound and although it looks okay I am a little concerned about the possibility of a continuing infection. Specifically with Pseudomonas. For  that reason I will go ahead and send in a prescription for Cipro for her to be continued. 11/14/2020 upon evaluation today patient appears to be doing very well currently in regard to her wound on her leg. She has been tolerating the dressing changes without complication. Fortunately I feel like the infection is finally under good control here. Unfortunately we do not have the Apligraf for application today although we can definitely order to have it in place for next week. That will be her fifth and final of the current series. Nonetheless I feel like her wound is really doing quite well which is great news. 11/21/2020 upon evaluation today patient appears to be doing well with regard to her wound on the medial ankle. Fortunately I think the infection is under control and I do believe we can go ahead and reapply the Apligraf today. She is in agreement with that plan. There does not appear to be any signs of active infection at this time which is great news. No fevers, chills, nausea, vomiting, or diarrhea. 12/05/2020 upon evaluation today patient's wound bed actually showed signs of good granulation epithelization at this point. There does not appear to be any signs of infection which is great news and overall very pleased with where things stand. Overall the patient seems to be doing fairly well in my opinion with regard to her wound although I do believe she continues to build up a lot of biofilm I think she could benefit from using PuraPly at this point. 12/12/2020 upon evaluation today patient's wound actually appears to  be doing decently well today. The Unna boot has not been quite as well-tolerated so that more uncomfortable for her and even causing some pressure over the plate on the lateral portion of her foot which is 90 where the wound is. There did not appear to be any significant deep tissue injury with that there may be a minimal change in the skin noted I think that we may want to go back to  the Coflex 2 layer which is a little bit easier on her skin it seems. 12/19/2020 upon evaluation today patient actually seems to be making great progress with the PuraPly currently. She in fact seems to be much better as far as the overall appearance of the wound bed is concerned I am very happy in this regard. I do not see any signs of of infection which is great news as well. No fevers, chills, nausea, vomiting, or diarrhea. 12/26/2020 upon evaluation today patient appears to actually be doing better in regard to her wound on the left medial ankle region. The surface of the wound is actually doing significantly better which is great news. There does not appear to be any signs of infection which is also great news and in general I am extremely pleased with where we stand today. 01/02/2021 upon evaluation today patient appears to be doing well with regard to her wound. In fact this is showing signs of excellent improvement and very pleased with where things stand. In fact the last 3 appointments have all shown signs of this getting smaller which is excellent news. I have not even had to perform any debridement and today is no exception. Overall I feel like this is dramatically improved compared to previous. T oday is PuraPly application #4. 0/96/0454 upon evaluation today patient appears to be doing excellent in regard to her wound this is continue to show signs of improvement and overall I am extremely pleased with where we stand today. She is actually here for PuraPly application #5. Every time we have applied this she is noted definite improvement on measurements. 01/23/2021 upon evaluation today patient is actually making good progress in regard to her wound. This was actually on just a little bit longer this time compared to previous due to the fact that she did have to go out of town. She is actually here for PuraPly application #6. We have definitely been seeing improvements in the overall quality  of the tissue on the surface of the wound which is awesome news. In general I think that the patient seems to be continuing to make great progress here. 01/30/2021 upon evaluation today patient's wound is actually doing excellent. There is really not any significant biofilm buildup which is great news and overall I am extremely pleased with where things stand today. There does not appear to be any signs of active infection. No fevers, chills, nausea, vomiting, or diarrhea. 02/06/2021 upon evaluation today patient's wound is actually showing signs of excellent improvement which is great news. We continue to see the benefit of the PuraPly this is doing a great job the wound seems not really irritated whatsoever and is showing signs of good granulation at this point. Overall I am extremely happy with what we are seeing. The patient likewise is happy to hear all of this as well. 02/13/2021 upon evaluation today patient appears to be doing well with regard to her wound. This again is measuring smaller today and I am very pleased in this regard. Fortunately there does not appear  to be any signs of active infection at this time which is good news from a systemic standpoint. Locally there is still some significant drainage which she does have is concerned about infection locally. No fevers, chills, nausea, vomiting, or diarrhea. 02/20/2021 upon evaluation today patient actually appears to be doing decently well in regard to her wound. She has been tolerating the dressing changes without complication. I do believe the PuraPly is helping wound bed does appear to be doing better. There is no evidence of active infection locally or systemically at this point visually although on fluorescence imaging there still appear to be bacterial load and bioburden noted. 02/27/2021 upon evaluation today patient fortunately continues to show signs of improvement with use of the PuraPly currently. Subsequently we did review  her culture results and to be honest I think that the Bactrim is probably the best option to have her continue at this point. For that reason I am get a go ahead and send in a refill today for her for an additional 2 weeks. Nonetheless I think that we are making excellent progress here. It was Enterococcus and Staphylococcus that were noted she is allergic to penicillin so there is not much I can do from the Enterococcus standpoint though the staff does seem to be sensitive to the Bactrim. 03/06/2021 upon evaluation today patient appears to be doing well with regard to her wound. This is definitely showing signs of improvement which is great news and overall I am extremely pleased with where things stand today. There does not appear to be any signs of active infection which is great news and overall and I do believe that we are headed in the appropriate direction I think the PuraPly is doing an awesome job for her. 03/20/2021 upon evaluation today patient actually appears to be doing well with regard to her wound. We have been using the PuraPly although last week when I was out of the office they actually just used endoform nonetheless this still seems to be doing great. Fortunately there does not appear to be any signs of active infection which is great news overall I think the patient is making wonderful progress. 03/27/2021 upon evaluation today patient appears to be doing well with regard to her wound in fact there is a little drainage but otherwise her does not appear to be any major signs of open wounds nor infection at this time. Overall I am extremely pleased with where things stand. No fevers, chills, nausea, vomiting, or diarrhea. 04/03/2021 upon evaluation today patient presents for reevaluation here in the clinic and overall she seems to be doing quite well. Fortunately there does not appear to be any signs of active infection which is great news and in general I am very pleased with how  things appear. There is still a very small opening remaining but in general she is looking much better. Fortunately I do not think there is any need for the PuraPly at this point. 04/10/2021 upon evaluation today patient's wound is actually showing signs of excellent improvement. I am actually very pleased with where it stands today. I think she is very close to complete closure in fact this may be healed today. Either way I think that she may benefit from using her own compression socks for the next week and then will subsequently see how things stand following. Objective Constitutional Well-nourished and well-hydrated in no acute distress. Vitals Time Taken: 9:57 AM, Height: 68 in, Temperature: 97.6 F, Pulse: 85 bpm, Respiratory Rate:  18 breaths/min, Blood Pressure: 119/84 mmHg. Respiratory normal breathing without difficulty. Psychiatric this patient is able to make decisions and demonstrates good insight into disease process. Alert and Oriented x 3. pleasant and cooperative. General Notes: Patient's wound again seem to show signs of complete epithelization I did not see any specific wound opening there was a small pinpoint area right in the middle where there might of been some drainage but I think this was just a little fluid trapped underneath some dried drainage and this is actually healed I do believe. Nonetheless at this point my suggestion is probably can to be to use her own compression sock with a protective silicone border foam dressing for the next week and see how you are doing. Integumentary (Hair, Skin) Wound #15 status is Open. Original cause of wound was Gradually Appeared. The date acquired was: 12/30/2019. The wound has been in treatment 64 weeks. The wound is located on the Left,Medial Malleolus. The wound measures 0.1cm length x 0.1cm width x 0.1cm depth; 0.008cm^2 area and 0.001cm^3 volume. There is Fat Layer (Subcutaneous Tissue) exposed. There is no tunneling or  undermining noted. There is a small amount of serosanguineous drainage noted. The wound margin is distinct with the outline attached to the wound base. There is large (67-100%) pink granulation within the wound bed. There is no necrotic tissue within the wound bed. 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 Margarita Grizzle Bathing/ Shower/ Hygiene: May shower and wash wound with soap and water. Edema Control - Lymphedema / SCD / Other: Elevate legs to the level of the heart or above for 30 minutes daily and/or when sitting, a frequency of: - throughout the day Avoid standing for long periods of time. Exercise regularly Compression stocking or Garment 20-30 mm/Hg pressure to: - bilateral legs Additional Orders / Instructions: Follow Nutritious Diet WOUND #15: - Malleolus Wound Laterality: Left, Medial Cleanser: Soap and Water Every Other Day/30 Days Discharge Instructions: May shower and wash wound with dial antibacterial soap and water prior to dressing change. Secondary Dressing: Zetuvit Plus Silicone Border Dressing 4x4 (in/in) Every Other Day/30 Days Discharge Instructions: Apply silicone border over primary dressing as directed. 1. Would recommend currently that we have the patient use of border foam dressing of the silicone variety over the next week and will subsequently see how things are doing at follow-up. 2. With regard to compression on when I have her use her own compression sock which I think will be appropriate as well. 3. If she is still closed and completely healed come next week I think she is ready for discharge. She is extremely excited as this has been a very long time coming.  In fact I think right now she has a treatment week 80. We will see patient back for reevaluation in 1 week here in the clinic. If anything worsens or changes patient will contact our office for additional recommendations. Electronic Signature(s) Signed: 04/10/2021 11:06:23 AM By: [name] PA-C Entered By: [name] on 04/10/2021 11:06:23 -------------------------------------------------------------------------------- SuperBill Details Patient Name: Date of Service: [name], [name]. 04/10/2021 Medical Record Number: [id number] Patient Account Number: [id number] Date of Birth/Sex: Treating RN: Aug 12, 1955 (65 y.o. [name] Primary Care Provider: [name] Other Clinician: Referring  Provider: Treating Provider/Extender: [name] Weeks in Treatment: 64 Diagnosis Coding ICD-10 Codes Code Description I87.332 Chronic venous hypertension (idiopathic) with ulcer and inflammation of left lower extremity L97.828 Non-pressure chronic ulcer of other part of left lower leg with other specified severity L97.328 Non-pressure chronic ulcer of left ankle with other specified severity Facility Procedures CPT4 Code: [id number] Description: [phone] - WOUND CARE VISIT-LEV 2 EST PT Modifier: Quantity: 1 Physician Procedures : CPT4 Code Description Modifier [id number] - WC PHYS LEVEL 3 - EST PT ICD-10 Diagnosis Description I87.332 Chronic venous hypertension (idiopathic) with ulcer and inflammation of left lower extremity L97.828 Non-pressure chronic ulcer of other part  of left lower leg with other specified severity L97.328 Non-pressure chronic ulcer of left ankle with other specified severity Quantity: 1 Electronic Signature(s) Signed: 04/10/2021 11:06:44 AM By: [name] PA-C Entered By: [name] on 04/10/2021 11:06:43

## 2021-04-10 NOTE — Progress Notes (Signed)
[name], [name] ([id number]) Visit Report for 04/10/2021 Arrival Information Details Patient Name: Date of Service: [place] [name], [name] 04/10/2021 9:45 A M Medical Record Number: [id number] Patient Account Number: [id number] Date of Birth/Sex: Treating RN: 1956/01/17 (65 y.o. [name] Primary Care Krystn Dermody: [name] Other Clinician: Referring Lynnwood Beckford: Treating Rondrick Barreira/Extender: [name] in Treatment: 21 Visit Information History Since Last Visit Added or deleted any medications: No Patient Arrived: Cane Any new allergies or adverse reactions: No Arrival Time: 09:53 Had a fall or experienced change in No Transfer Assistance: None activities of daily living that may affect Patient Identification Verified: Yes risk of falls: Secondary Verification Process Completed: Yes Signs or symptoms of abuse/neglect since last visito No Patient Requires Transmission-Based Precautions: No Hospitalized since last visit: No Patient Has Alerts: Yes Implantable device outside of the clinic excluding No Patient Alerts: L ABI =1.12, TBI = .92 cellular tissue based products placed in the center R ABI= 1.02, TBI= .58 since last visit: Has Dressing in Place as Prescribed: Yes Has Compression in Place as Prescribed: Yes Pain Present Now: No Electronic Signature(s) Signed: 04/10/2021 5:39:20 PM By: [name] Entered By: [name] on 04/10/2021 09:53:48 -------------------------------------------------------------------------------- Clinic Level of Care Assessment Details Patient Name: Date of Service: [facility] [name], [name] 04/10/2021 9:45 A M Medical Record Number: [id number] Patient Account Number: [id number] Date of Birth/Sex: Treating RN: 1955/08/09 (65 y.o. [name] Primary Care Anelisse Jacobson: [name] Other Clinician: Referring Barbette Mcglaun: Treating Lakhia Gengler/Extender: [name] in Treatment: 11 Clinic Level  of Care Assessment Items TOOL 4 Quantity Score X- 1 0 Use when only an EandM is performed on FOLLOW-UP visit ASSESSMENTS - Nursing Assessment / Reassessment X- 1 10 Reassessment of Co-morbidities (includes updates in patient status) X- 1 5 Reassessment of Adherence to Treatment Plan ASSESSMENTS - Wound and Skin A ssessment / Reassessment X - Simple Wound Assessment / Reassessment - one wound 1 5 _0  - 0 Complex Wound Assessment / Reassessment - multiple wounds _1  - 0 Dermatologic / Skin Assessment (not related to wound area) ASSESSMENTS - Focused Assessment _2  - 0 Circumferential Edema Measurements - multi extremities _3  - 0 Nutritional Assessment / Counseling / Intervention _4  - 0 Lower Extremity Assessment (monofilament, tuning fork, pulses) _5  - 0 Peripheral Arterial Disease Assessment (using hand held doppler) ASSESSMENTS - Ostomy and/or Continence Assessment and Care _6  - 0 Incontinence Assessment and Management _7  - 0 Ostomy Care Assessment and Management (repouching, etc.) PROCESS - Coordination of Care _8  - 0 Simple Patient / Family Education for ongoing care X- 1 20 Complex (extensive) Patient / Family Education for ongoing care _9  - 0 Staff obtains Programmer, systems, Records, T Results / Process Orders est _10  - 0 Staff telephones HHA, Nursing Homes / Clarify orders / etc _11  - 0 Routine Transfer to another Facility (non-emergent condition) _12  - 0 Routine Hospital Admission (non-emergent condition) _13  - 0 New Admissions / Biomedical engineer / Ordering NPWT Apligraf, etc. , _14  - 0 Emergency Hospital Admission (emergent condition) _15  - 0 Simple Discharge Coordination _16  - 0 Complex (extensive) Discharge Coordination PROCESS - Special Needs _17  - 0 Pediatric / Minor Patient Management _18  - 0 Isolation Patient Management _19  - 0 Hearing / Language / Visual special needs _20  - 0 Assessment of Community assistance (transportation, D/C planning, etc.) _21  -  0 Additional assistance / Altered mentation _22  - 0 Support Surface(s) Assessment (bed, cushion, seat, etc.) INTERVENTIONS - Wound  Cleansing / Measurement X - Simple Wound Cleansing - one wound 1 5 _0  - 0 Complex Wound Cleansing - multiple wounds X- 1 5 Wound Imaging (photographs - any number of wounds) _1  - 0 Wound Tracing (instead of photographs) X- 1 5 Simple Wound Measurement - one wound _2  - 0 Complex Wound Measurement - multiple wounds INTERVENTIONS - Wound Dressings X - Small Wound Dressing one or multiple wounds 1 10 _3  - 0 Medium Wound Dressing one or multiple wounds _4  - 0 Large Wound Dressing one or multiple wounds <UXLKGMWNUUVOZDGU>_4<\/QIHKVQQVZDGLOVFI>_4  - 0 Application of Medications - topical <PPIRJJOACZYSAYTK>_1<\/SWFUXNATFTDDUKGU>_5  - 0 Application of Medications - injection INTERVENTIONS - Miscellaneous _7  - 0 External ear exam _8  - 0 Specimen Collection (cultures, biopsies, blood, body fluids, etc.) _9  - 0 Specimen(s) / Culture(s) sent or taken to Lab for analysis _10  - 0 Patient Transfer (multiple staff / Civil Service fast streamer / Similar devices) _11  - 0 Simple Staple / Suture removal (25 or less) _12  - 0 Complex Staple / Suture removal (26 or more) _13  - 0 Hypo / Hyperglycemic Management (close monitor of Blood Glucose) _14  - 0 Ankle / Brachial Index (ABI) - do not check if billed separately X- 1 5 Vital Signs Has the patient been seen at the hospital within the last three years: Yes Total Score: 70 Level Of Care: New/Established - Level 2 Electronic Signature(s) Signed: 04/10/2021 5:39:20 PM By: [name] Entered By: [name] on 04/10/2021 10:46:42 -------------------------------------------------------------------------------- Encounter Discharge Information Details Patient Name: Date of Service: [name], [name] 04/10/2021 9:45 A M Medical Record Number: [id number] Patient Account Number: [id number] Date of Birth/Sex: Treating RN: 1955-12-11 (65 y.o. [name] Primary Care Akul Leggette: [name] Other  Clinician: Referring Thaddeaus Monica: Treating Jazmin Vensel/Extender: [name] in Treatment: 13 Encounter Discharge Information Items Discharge Condition: Stable Ambulatory Status: Cane Discharge Destination: Home Transportation: Private Auto Schedule Follow-up Appointment: Yes Clinical Summary of Care: Provided on 04/10/2021 Form Type Recipient Paper Patient Patient Electronic Signature(s) Signed: 04/10/2021 5:39:20 PM By: [name] Entered By: [name] on 04/10/2021 10:48:15 -------------------------------------------------------------------------------- Lower Extremity Assessment Details Patient Name: Date of Service: [facility] [name], [name] 04/10/2021 9:45 A M Medical Record Number: [id number] Patient Account Number: [id number] Date of Birth/Sex: Treating RN: Mar 20, 1956 (65 y.o. [name] Primary Care Maezie Justin: [name] Other Clinician: Referring Ordean Fouts: Treating Gracelyn Coventry/Extender: [name] in Treatment: 64 Edema Assessment Assessed: [Left: Yes] [Right: No] Edema: [Left: Ye] [Right: s] Calf Left: Right: Point of Measurement: 31 cm From Medial Instep 24.5 cm Ankle Left: Right: Point of Measurement: 11 cm From Medial Instep 20.5 cm Vascular Assessment Pulses: Dorsalis Pedis Palpable: [Left:Yes] Electronic Signature(s) Signed: 04/10/2021 5:39:20 PM By: [name] Entered By: [name] on 04/10/2021 10:02:38 -------------------------------------------------------------------------------- Multi-Disciplinary Care Plan Details Patient Name: Date of Service: [facility] [name], [name] 04/10/2021 9:45 A M Medical Record Number: [id number] Patient Account Number: [id number] Date of Birth/Sex: Treating RN: 1955-09-23 (65 y.o. [name] Primary Care Izaia Say: [name] Other Clinician: Referring Jakel Alphin: Treating Sharetha Newson/Extender: [name] in Treatment:  66 Multidisciplinary Care Plan reviewed with physician Active Inactive Venous Leg Ulcer Nursing Diagnoses: Actual venous Insuffiency (use after diagnosis is confirmed) Knowledge deficit related to disease process and management Goals: Patient will maintain optimal edema control Date Initiated: 06/20/2020 Target Resolution Date: 04/24/2021 Goal Status: Active Interventions: Assess peripheral edema status every visit. Compression as ordered Treatment Activities: Therapeutic compression applied :  06/20/2020 Notes: 01/30/21: Edema control ongoing, using CoFlex wrap. Wound/Skin Impairment Nursing Diagnoses: Impaired tissue integrity Knowledge deficit related to ulceration/compromised skin integrity Goals: Patient/caregiver will verbalize understanding of skin care regimen Date Initiated: 01/17/2020 Target Resolution Date: 04/24/2021 Goal Status: Active Ulcer/skin breakdown will have a volume reduction of 30% by week 4 Date Initiated: 01/17/2020 Date Inactivated: 03/12/2020 Target Resolution Date: 03/09/2020 Goal Status: Met Interventions: Assess patient/caregiver ability to obtain necessary supplies Assess patient/caregiver ability to perform ulcer/skin care regimen upon admission and as needed Assess ulceration(s) every visit Provide education on ulcer and skin care Notes: 01/30/21: Wound care regimen continues, PuraPly skin sub being used. Electronic Signature(s) Signed: 04/10/2021 5:39:20 PM By: [name] Entered By: [name] on 04/10/2021 09:52:33 -------------------------------------------------------------------------------- Pain Assessment Details Patient Name: Date of Service: [name], [name] 04/10/2021 9:45 A M Medical Record Number: [id number] Patient Account Number: [id number] Date of Birth/Sex: Treating RN: [birth year] (65 y.o. [name] Primary Care Servando Kyllonen: [name] Other Clinician: Referring Gevorg Brum: Treating Keiffer Piper/Extender: [name] in Treatment: 40 Active Problems Location of Pain Severity and Description of Pain Patient Has Paino No Site Locations Pain Management and Medication Current Pain Management: Electronic Signature(s) Signed: 04/10/2021 5:39:20 PM By: [name] Entered By: [name] on 04/10/2021 10:02:09 -------------------------------------------------------------------------------- Patient/Caregiver Education Details Patient Name: Date of Service: [name], [name] 11/9/2022andnbsp9:45 A M Medical Record Number: [id number] Patient Account Number: [id number] Date of Birth/Gender: Treating RN: [birth year] (65 y.o. [name] Primary Care Physician: [name] Other Clinician: Referring Physician: Treating Physician/Extender: [name] in Treatment: 39 Education Assessment Education Provided To: Patient Education Topics Provided Venous: Methods: Explain/Verbal, Printed Responses: State content correctly Wound/Skin Impairment: Methods: Explain/Verbal, Printed Responses: State content correctly Electronic Signature(s) Signed: 04/10/2021 5:39:20 PM By: [name] Entered By: [name] on 04/10/2021 09:53:11 -------------------------------------------------------------------------------- Wound Assessment Details Patient Name: Date of Service: [facility] [name], [name] 04/10/2021 9:45 A M Medical Record Number: [id number] Patient Account Number: [id number] Date of Birth/Sex: Treating RN: [birth year] (65 y.o. [name] Primary Care Chan Rosasco: [name] Other Clinician: Referring Torsha Lemus: Treating Hadlie Gipson/Extender: [name] in Treatment: 76 Wound Status Wound Number: 15 Primary Venous Leg Ulcer Etiology: Wound Location: Left, Medial Malleolus Wound Status: Open Wounding Event: Gradually Appeared Comorbid Peripheral Venous Disease, Osteoarthritis, Confinement Date  Acquired: 12/30/2019 History: Anxiety Weeks Of Treatment: 64 Clustered Wound: No Photos Wound Measurements Length: (cm) 0.1 Width: (cm) 0.1 Depth: (cm) 0.1 Area: (cm) 0.008 Volume: (cm) 0.001 % Reduction in Area: 99.9% % Reduction in Volume: 100% Epithelialization: Large (67-100%) Tunneling: No Undermining: No Wound Description Classification: Full Thickness With Exposed Support Structures Wound Margin: Distinct, outline attached Exudate Amount: Small Exudate Type: Serosanguineous Exudate Color: red, brown Foul Odor After Cleansing: No Slough/Fibrino No Wound Bed Granulation Amount: Large (67-100%) Exposed Structure Granulation Quality: Pink Fascia Exposed: No Necrotic Amount: None Present (0%) Fat Layer (Subcutaneous Tissue) Exposed: Yes Tendon Exposed: No Muscle Exposed: No Joint Exposed: No Bone Exposed: No Treatment Notes Wound #15 (Malleolus) Wound Laterality: Left, Medial Cleanser Soap and Water Discharge Instruction: May shower and wash wound with dial antibacterial soap and water prior to dressing change. Peri-Wound Care Topical Primary Dressing Secondary Dressing Zetuvit Plus Silicone Border Dressing 4x4 (in/in) Discharge Instruction: Apply silicone border over primary dressing as directed. Secured With Compression Wrap Compression Stockings Environmental education officer) Signed: 04/10/2021 5:39:20 PM By: [name] Entered By: [name] on  04/10/2021 10:10:54 -------------------------------------------------------------------------------- Vitals Details Patient Name: Date of Service: [facility], [name] 04/10/2021 9:45 A M Medical Record Number: [id number] Patient Account Number: [id number] Date of Birth/Sex: Treating RN: [birth year] (65 y.o. [name] Primary Care Izabela Ow: [name] Other Clinician: Referring Nason Conradt: Treating Lajada Janes/Extender: [name] in Treatment: 64 Vital Signs Time  Taken: 09:57 Temperature (F): 97.6 Height (in): 68 Pulse (bpm): 85 Respiratory Rate (breaths/min): 18 Blood Pressure (mmHg): 119/84 Reference Range: 80 - 120 mg / dl Electronic Signature(s) Signed: 04/10/2021 5:39:20 PM By: [name] Entered By: [name] on 04/10/2021 10:01:52

## 2021-04-17 ENCOUNTER — Other Ambulatory Visit: Payer: Self-pay

## 2021-04-17 ENCOUNTER — Encounter (HOSPITAL_BASED_OUTPATIENT_CLINIC_OR_DEPARTMENT_OTHER): Payer: Medicare Other | Admitting: Physician Assistant

## 2021-04-17 DIAGNOSIS — I87332 Chronic venous hypertension (idiopathic) with ulcer and inflammation of left lower extremity: Secondary | ICD-10-CM | POA: Diagnosis not present

## 2021-04-17 NOTE — Progress Notes (Signed)
[name], [name] ([id number]) Visit Report for 04/17/2021 Arrival Information Details Patient Name: Date of Service: [facility], [name] 04/17/2021 12:30 PM Medical Record Number: [id number] Patient Account Number: [id number] Date of Birth/Sex: Treating RN: 10-25-1955 (65 y.o. [name] Primary Care [name]: [name] Other Clinician: Referring [name]: Treating [name]/Extender: [name] in [name]: 85 Visit Information History Since Last Visit Added or deleted any medications: No Patient Arrived: Cane Any new allergies or adverse reactions: No Arrival Time: 12:40 Had a fall or experienced change in No Transfer Assistance: None activities of daily living that may affect Patient Identification Verified: Yes risk of falls: Secondary Verification Process Completed: Yes Signs or symptoms of abuse/neglect since last visito No Patient Requires Transmission-Based Precautions: No Hospitalized since last visit: No Patient Has Alerts: Yes Implantable device outside of the clinic excluding No Patient Alerts: L ABI =1.12, TBI = .92 cellular tissue based products placed in the center R ABI= 1.02, TBI= .58 since last visit: Has Dressing in Place as Prescribed: Yes Has Compression in Place as Prescribed: Yes Pain Present Now: No Electronic Signature(s) Signed: 04/17/2021 5:29:48 PM By: [name] Entered By: [name] on 04/17/2021 12:40:29 -------------------------------------------------------------------------------- Clinic Level of Care Assessment Details Patient Name: Date of Service: [facility] [name], [name] 04/17/2021 12:30 PM Medical Record Number: [id number] Patient Account Number: [id number] Date of Birth/Sex: Treating RN: 05/13/1956 (65 y.o. [name] Primary Care [name]: [name] Other Clinician: Referring [name]: Treating [name]/Extender: [name] in [name]: 24 Clinic  Level of Care Assessment Items TOOL 4 Quantity Score X- 1 0 Use when only an EandM is performed on FOLLOW-UP visit ASSESSMENTS - Nursing Assessment / Reassessment X- 1 10 Reassessment of Co-morbidities (includes updates in patient status) X- 1 5 Reassessment of Adherence to [name] Plan ASSESSMENTS - Wound and Skin A ssessment / Reassessment X - Simple Wound Assessment / Reassessment - one wound 1 5 []  - 0 Complex Wound Assessment / Reassessment - multiple wounds []  - 0 Dermatologic / Skin Assessment (not related to wound area) ASSESSMENTS - Focused Assessment []  - 0 Circumferential Edema Measurements - multi extremities []  - 0 Nutritional Assessment / Counseling / Intervention []  - 0 Lower Extremity Assessment (monofilament, tuning fork, pulses) []  - 0 Peripheral Arterial Disease Assessment (using hand held doppler) ASSESSMENTS - Ostomy and/or Continence Assessment and Care []  - 0 Incontinence Assessment and Management []  - 0 Ostomy Care Assessment and Management (repouching, etc.) PROCESS - Coordination of Care []  - 0 Simple Patient / Family Education for ongoing care X- 1 20 Complex (extensive) Patient / Family Education for ongoing care []  - 0 Staff obtains Programmer, systems, Records, T Results / Process Orders est X- 1 10 Staff telephones HHA, Nursing Homes / Clarify orders / etc []  - 0 Routine Transfer to another Facility (non-emergent condition) []  - 0 Routine Hospital Admission (non-emergent condition) []  - 0 New Admissions / Biomedical engineer / Ordering NPWT Apligraf, etc. , []  - 0 Emergency Hospital Admission (emergent condition) []  - 0 Simple Discharge Coordination []  - 0 Complex (extensive) Discharge Coordination PROCESS - Special Needs []  - 0 Pediatric / Minor Patient Management []  - 0 Isolation Patient Management []  - 0 Hearing / Language / Visual special needs []  - 0 Assessment of Community assistance (transportation, D/C planning, etc.) []   - 0 Additional assistance / Altered mentation []  - 0 Support Surface(s) Assessment (bed, cushion, seat, etc.) INTERVENTIONS - Wound Cleansing /  Measurement []  - 0 Simple Wound Cleansing - one wound []  - 0 Complex Wound Cleansing - multiple wounds []  - 0 Wound Imaging (photographs - any number of wounds) []  - 0 Wound Tracing (instead of photographs) []  - 0 Simple Wound Measurement - one wound []  - 0 Complex Wound Measurement - multiple wounds INTERVENTIONS - Wound Dressings X - Small Wound Dressing one or multiple wounds 1 10 []  - 0 Medium Wound Dressing one or multiple wounds []  - 0 Large Wound Dressing one or multiple wounds []  - 0 Application of Medications - topical []  - 0 Application of Medications - injection INTERVENTIONS - Miscellaneous []  - 0 External ear exam []  - 0 Specimen Collection (cultures, biopsies, blood, body fluids, etc.) []  - 0 Specimen(s) / Culture(s) sent or taken to Lab for analysis []  - 0 Patient Transfer (multiple staff / Civil Service fast streamer / Similar devices) []  - 0 Simple Staple / Suture removal (25 or less) []  - 0 Complex Staple / Suture removal (26 or more) []  - 0 Hypo / Hyperglycemic Management (close monitor of Blood Glucose) []  - 0 Ankle / Brachial Index (ABI) - do not check if billed separately X- 1 5 Vital Signs Has the patient been seen at the hospital within the last three years: Yes Total Score: 65 Level Of Care: New/Established - Level 2 Electronic Signature(s) Signed: 04/17/2021 5:29:48 PM By: [name] Entered By: [name] on 04/17/2021 13:05:18 -------------------------------------------------------------------------------- Lower Extremity Assessment Details Patient Name: Date of Service: [facility] [name], [name] 04/17/2021 12:30 PM Medical Record Number: [id number] Patient Account Number: [id number] Date of Birth/Sex: Treating RN: 1956-01-05 (65 y.o. [name] Primary Care [name]: [name] Other  Clinician: Referring [name]: Treating Aizley Stenseth/Extender: [name] Weeks in [name]: 67 Edema Assessment Assessed: [Left: Yes] [Right: No] Edema: [Left: Ye] [Right: s] Calf Left: Right: Point of Measurement: 31 cm From Medial Instep 26 cm Ankle Left: Right: Point of Measurement: 11 cm From Medial Instep 21.8 cm Vascular Assessment Pulses: Dorsalis Pedis Palpable: [Left:Yes] Electronic Signature(s) Signed: 04/17/2021 5:29:48 PM By: [name] Entered By: [name] on 04/17/2021 12:45:32 -------------------------------------------------------------------------------- Multi-Disciplinary Care Plan Details Patient Name: Date of Service: [facility] [name], [name] 04/17/2021 12:30 PM Medical Record Number: [id number] Patient Account Number: [id number] Date of Birth/Sex: Treating RN: 1955-06-12 (65 y.o. [name] Primary Care Corby Vandenberghe: [name] Other Clinician: Referring Zoriana Oats: Treating Anuoluwapo Mefferd/Extender: [name] in [name]: 29 Multidisciplinary Care Plan reviewed with physician Active Inactive Venous Leg Ulcer Nursing Diagnoses: Actual venous Insuffiency (use after diagnosis is confirmed) Knowledge deficit related to disease process and management Goals: Patient will maintain optimal edema control Date Initiated: 06/20/2020 Target Resolution Date: 05/08/2021 Goal Status: Active Interventions: Assess peripheral edema status every visit. Compression as ordered [name] Activities: Therapeutic compression applied : 06/20/2020 Notes: 01/30/21: Edema control ongoing, using CoFlex wrap. Electronic Signature(s) Signed: 04/17/2021 5:29:48 PM By: [name] Entered By: [name] on 04/17/2021 13:03:21 -------------------------------------------------------------------------------- Pain Assessment Details Patient Name: Date of Service: [facility] [name], [name] 04/17/2021 12:30 PM Medical Record  Number: [id number] Patient Account Number: [id number] Date of Birth/Sex: Treating RN: Sep 27, 1955 (65 y.o. [name] Primary Care Alvie Speltz: [name] Other Clinician: Referring Thao Vanover: Treating Laniece Hornbaker/Extender: [name] in [name]: 44 Active Problems Location of Pain Severity and Description of Pain Patient Has Paino No Site Locations Pain Management and Medication Current Pain Management: Electronic Signature(s) Signed: 04/17/2021 5:29:48 PM By:  [name] Entered By: [name] on 04/17/2021 12:43:09 -------------------------------------------------------------------------------- Patient/Caregiver Education Details Patient Name: Date of Service: [facility] [name], [name] 11/16/2022andnbsp12:30 PM Medical Record Number: [id number] Patient Account Number: [id number] Date of Birth/Gender: Treating RN: 1956/05/03 (65 y.o. [name] Primary Care Physician: [name] Other Clinician: Referring Physician: Treating Physician/Extender: [name] in [name]: 60 Education Assessment Education Provided To: Patient Education Topics Provided Venous: Methods: Explain/Verbal, Printed Responses: State content correctly Electronic Signature(s) Signed: 04/17/2021 5:29:48 PM By: [name] Entered By: [name] on 04/17/2021 13:04:05 -------------------------------------------------------------------------------- Wound Assessment Details Patient Name: Date of Service: [facility] [name], [name] 04/17/2021 12:30 PM Medical Record Number: [id number] Patient Account Number: [id number] Date of Birth/Sex: Treating RN: 25-Oct-1955 (65 y.o. [name] Primary Care Izell Labat: [name] Other Clinician: Referring Charrisse Masley: Treating Shawnee Gambone/Extender: [name] in [name]: 76 Wound Status Wound Number: 15 Primary Venous Leg Ulcer Etiology: Wound Location: Left,  Medial Malleolus Wound Status: Healed - Epithelialized Wounding Event: Gradually Appeared Comorbid Peripheral Venous Disease, Osteoarthritis, Confinement Date Acquired: 12/30/2019 History: Anxiety Weeks Of [name]: 67 Clustered Wound: No Wound Measurements Length: (cm) Width: (cm) Depth: (cm) Area: (cm) Volume: (cm) 0 % Reduction in Area: 100% 0 % Reduction in Volume: 100% 0 0 0 Wound Description Classification: Full Thickness With Exposed Support Structures Electronic Signature(s) Signed: 04/17/2021 5:29:48 PM By: [name] Entered By: [name] on 04/17/2021 12:58:21 -------------------------------------------------------------------------------- Vitals Details Patient Name: Date of Service: [name], [name] 04/17/2021 12:30 PM Medical Record Number: [id number] Patient Account Number: [id number] Date of Birth/Sex: Treating RN: [birth year] (65 y.o. [name] Primary Care Prestina Raigoza: [name] Other Clinician: Referring Reni Hausner: Treating Zerenity Bowron/Extender: [name] in [name]: 66 Vital Signs Time Taken: 12:40 Temperature (F): 98.1 Height (in): 68 Pulse (bpm): 54 Respiratory Rate (breaths/min): 16 Blood Pressure (mmHg): 111/65 Reference Range: 80 - 120 mg / dl Electronic Signature(s) Signed: 04/17/2021 5:29:48 PM By: [name] Entered By: [name] on 04/17/2021 12:42:58

## 2021-04-17 NOTE — Progress Notes (Addendum)
[name] ([id number]) Visit Report for 04/17/2021 Chief Complaint Document Details Patient Name: Date of Service: [facility] [name], [name] 04/17/2021 12:30 PM Medical Record Number: [id number] Patient Account Number: [id number] Date of Birth/Sex: Treating RN: 10-Dec-1955 (65 y.o. [name] Primary Care Provider: [name] Other Clinician: Referring Provider: Treating Provider/Extender: [name] in Treatment: 65 Information Obtained from: Patient Chief Complaint patient is been followed long-term in this clinic for venous insufficiency ulcers with inflammation, hypertension and ulceration over the medial ankle bilaterally. 01/17/2020; this is a patient who is here for review of postoperative wounds on the left lateral ankle and recurrence of venous stasis ulceration on the left medial Electronic Signature(s) Signed: 04/17/2021 12:49:58 PM By: [name] PA-C Entered By: [name] on 04/17/2021 12:49:58 -------------------------------------------------------------------------------- HPI Details Patient Name: Date of Service: [name] [name], [name] 04/17/2021 12:30 PM Medical Record Number: [id number] Patient Account Number: [id number] Date of Birth/Sex: Treating RN: 04/18/1956 (65 y.o. [name] Primary Care Provider: [name] Other Clinician: Referring Provider: Treating Provider/Extender: [name] in Treatment: 62 History of Present Illness HPI Description: the remaining wound is over the left medial ankle. Similar wound over the right medial ankle healed largely with use of Apligraf. Most recently we have been using Hydrofera Blue over this wound with considerable improvement. The patient has been extensively worked up in the past for her venous insufficiency and she is not a candidate for antireflux surgery although I have none of the details available currently. 08/24/14; considerable  improvement today. About 50% of this wound areas now epithelialized. The base of the wound appears to be healthier granulation.as opposed to last week when she had deteriorated a considerable improvement 08/17/14; unfortunately the wound has regressed somewhat. The areas of epithelialization from the superior aspect are not nearly as healthy as they were last week. The patient thinks her Hydrofera Blue slipped. 09/07/14; unfortunately the area has markedly regressed in the 2 weeks since I've seen this. There is an odor surrounding erythema. The healthy granulation tissue that we had at the base of the wound now is a dusky color. The nurse reports green drainage 09/14/14; the area looks somewhat better than last week. There is less erythema and less drainage. The culture I did did not show any growth. Nevertheless I think it is better to continue the Cipro and doxycycline for a further week. The remaining wound area was debridement. 09/21/14. Wound did not require debridement last week. Still less erythema and less drainage. She can complete her antibiotics. The areas of epithelialization in the superior aspect of the wound do not look as healthy as they did some weeks ago 10/05/14 continued improvement in the condition of this wound. There is advancing epithelialization. Less aggressive debridement required 10/19/14 continued improvement in the condition and volume of this wound. Less aggressive debridement to the inferior part of this to remove surface slough and fibrinous eschar 11/02/14 no debridement is required. The surface granulation appears healthy although some of her islands of epithelialization seem to have regressed. No evidence of infection 11/16/14; lites surface debridement done of surface eschar. The wound does not look to be unhealthy. No evidence of infection. Unfortunately the patient has had podiatry issues in the right foot and for some reason has redeveloped small surface ulcerations in  the medial right ankle. Her original presentation involved wounds in this area 11/23/14 no debridement. The area on the right ankle  has enlarged. The left ankle wound appears stable in terms of the surface although there is periwound inflammation. There has been regression in the amount of new skin 11/30/14 no debridement. Both wound areas appear healthy. There was no evidence of infection. The the new area on the right medial ankle has enlarged although that both the surfaces appear to be stable. 12/07/14; Debridement of the right medial ankle wound. No no debridement was done on the left. 12/14/14 no major change in and now bilateral medial ankle wounds. Both of these are very painful but the no overt evidence of infection. She has had previous venous ablation 12/21/14; patient states that her right medial ankle wound is considerably more painful last week than usual. Her left is also somewhat painful. She could not tolerate debridement. The right medial ankle wound has fibrinous surface eschar 12/28/14 this is a patient with severe bilateral venous insufficiency ulcers. For a considerable period of time we actually had the one on the right medial ankle healed however this recently opened up again in June. The left medial ankle wound has been a refractory area with some absent flows. We had some success with Hydrofera Blue on this area and it literally closed by 50% however it is recently opened up Foley. Both of these were debridement today of surface eschar. She tolerates this poorly 01/25/15: No change in the status of this. Thick adherent escar. Very poor tolerance of any attempt at debridement. I had healed the right medial malleolus wound for a considerable amount of time and had the left one down to about 50% of the volume although this is totally regressed over the last 48 weeks. Further the right leg has reopened. she is trying to make a appointment with pain and vascular, previous ablations with  Dr. Aleda Grana. I do not believe there is an arterial insufficiency issue here 02/01/15 the status of the adherent eschar bilaterally is actually improved. No debridement was done. She did not manage to get vascular studies done 02/08/15 continued debridement of the area was done today. The slough is less adherent and comes off with less pressure. There is no surrounding infection peripheral pulses are intact 02/15/15 selective debridement with a disposable curette. Again the slough is less adherent and comes off with less difficulty. No surrounding infection peripheral pulses are intact. 02/22/15 selective debridement of the right medial ankle wound. Slough comes off with less difficulty. No obvious surrounding infection peripheral pulses are intact I did not debridement the one on the left. Both of these are stable to improved 03/01/15 selective debridement of both wound areas using a curette to. Adherent slough cup soft with less difficulty. No obvious surrounding infection. The patient tells me that 2 days ago she noted a rash above the right leg wrap. She did not have this on her lower legs when she change this over she arrives with widespread left greater than right almost folliculitis-looking rash which is extremely pruritic. I don't see anything to culture here. There is no rash on the rest of her body. She feels well systemically. 03/08/15; selective debridement of both wounds using a curette. Base of this does not look unhealthy. She had limegreen drainage coming out of the left leg wound and describes a lot of drainage. The rash on her left leg looks improved to. No cultures were done. 03/22/15; patient was not here last week. Basal wounds does not look healthy and there is no surrounding erythema. No drainage. There is still a rash on  the left leg that almost looks vasculitic however it is clearly limited to the top of where the wrap would be. 04/05/15; on the right required a surgical  debridement of surface eschar and necrotic subcutaneous tissue. I did not debridement the area on the left. These continue to be large open wounds that are not changing that much. We were successful at one point in healing the area on the right, and at the same time the area on the left was roughly half the size of current measurements. I think a lot of the deterioration has to do with the prolonged time the patient is on her feet at work 04/19/15 I attempted-like surface debridement bilaterally she does not tolerate this. She tells me that she was in allergic care yesterday with extreme pain over her left lateral malleolus/ankle and was told that she has an "sprain" 05/03/15; large bilateral venous insufficiency wounds over the medial malleolus/medial aspect of her ankles. She complains of copious amounts of drainage and his usual large amounts of pain. There is some increasing erythema around the wound on the right extending into the medial aspect of her foot to. historically she came in with these wounds the right one healed and the left one came down to roughly half its current size however the right one is reopened and the left is expanded. 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 05/10/15 large bilateral venous insufficiency wounds. There is less adherence surface left however the surface culture that I did last week grew pseudomonas therefore bilateral selective debridement score necessary. There is surrounding erythema. The patient describes severe bilateral drainage and a lot of pain in the left ankle. Apparently her podiatrist was were ready to do a cortisone shot 05/17/15; the patient complains of pain and again copious amounts of drainage. 05/24/15; we used Iodo flex last week. Patient notes considerable improvement in wound drainage. Only needed to change this once. 05/31/15; we continued Iodoflex; the base of these large wounds  bilaterally is not too bad but there is probably likely a significant bioburden here. I would like to debridement just doesn't tolerate it. 06/06/14 I would like to continue the Iodoflex although she still hasn't managed to obtain supplies. She has bilateral medial malleoli or large wounds which are mostly superficial. Both of them are covered circumferentially with some nonviable fibrinous slough although she tolerates debridement very poorly. She apparently has an appointment for an ablation on the right leg by interventional radiology. 06/14/15; the patient arrives with the wounds and static condition. We attempted a debridement although she does not do well with this secondary to pain. I 07/05/15; wounds are not much smaller however there appears to be a cleaner granulating base. The left has tight fibrinous slough greater than the right. Debridement is tolerated poorly due to pain. Iodoflex is done more for these wounds in any of the multitude of different dressings I have tried on the left 1 and then subsequently the right. 07/12/15; no change in the condition of this wound. I am able to do an aggressive debridement on the right but not the left. She simply cannot tolerate it. We have been using Iodoflex which helps somewhat. It is worthwhile remembering that at one point we healed the right medial ankle wound and the left was about 25% of the current circumference. We have suggested returning to vascular surgery for review of possible further ablations for one reason  or another she has not been able to do this. 07/26/15 no major change in the condition of either wound on her medial ankle. I did not attempt to debridement of these. She has been aggressively scrubbing these while she is in the shower at home. She has her supply of Iodoflex which seems to have done more for these wounds then anything I have put on recently. 08/09/15 wound area appears larger although not verified by measurements. Using  Iodoflex 09/05/2015 -- she was here for avisit today but had significant problems with the wound and I was asked to see her for a physician opinion. I have summarize that this lady has had surgery on her left lower extremity about 10 years ago where the possible veins stripping was done. She has had an opinion from interventional radiology around November 2016 where no further sclerotherapy was ordered. The patient works 12 hours a day and stands on a concrete floor with work boots and is unable to get the proper compression she requires and cannot elevate her limbs appropriately at any given time. She has recently grown Pseudomonas from her wound culture but has not started her ciprofloxacin which was called in for her. 09/13/15 this continues to be a difficult situation for this patient. At one point I had this wound down to a 1.5 x 1.5" wound on her left leg. This is deteriorated and the right leg has reopened. She now has substantial wounds on her medial calcaneus, malleoli and into her lower leg. One on the left has surface eschar but these are far too painful for me to debridement here. She has a vascular surgery appointment next week to see if anything can be done to help here. I think she has had previous ablations several years ago at [facility]. She has no major edema. She tells me that she did not get product last time [facility] Ag] and went for several days without it. She continues to work in work boots 12 hours a day. She cannot get compression/4-layer under her work boots. 09/20/15 no major change. Periwound edema control was not very good. Her point with pain and vascular is next Wednesday the 25th 09/28/15; the patient is seen vascular surgery and is apparently scheduled for repeat duplex ultrasounds of her bilateral lower legs next week. 10/05/15; the patient was seen by Dr. Doren Custard of vascular surgery. He feels that she should have arterial insufficiency excluded as cause/contributed to  her nonhealing stage she is therefore booked for an arteriogram. She has apparently monophasic signals in the dorsalis pedis pulses. She also of course has known severe chronic venous insufficiency with previous procedures as noted previously. I had another long discussion with the patient today about her continuing to work 12 hour shifts. I've written her out for 2 months area had concerns about this as her work location is currently undergoing significant turmoil and this may lead to her termination. She is aware of this however I agree with her that she simply cannot continue to stand for 12 hours multiple days a week with the substantial wound areas she has. 10/19/15; the Dr. Doren Custard appointment was largely for an arteriogram which was normal. She does not have an arterial issue. He didn't make a comment about her chronic venous insufficiency for which she has had previous ablations. Presumably it was not felt that anything additional could be done. The patient is now out of work as I prescribed 2 weeks ago. Her wounds look somewhat less aggravated presumably  because of this. I felt I would give debridement another try today 10/25/15; no major change in this patient's wounds. We are struggling to get her product that she can afford into her own home through her insurance. 11/01/15; no major change in the patient's wounds. I have been using silver alginate as the most affordable product. I spoke to Dr. Marla Roe last week with her requested take her to the OR for surgical debridement and placement of ACEL. Dr. Marla Roe told me that she would be willing to do this however [facility] will not cover this, fortunately the patient has [place] healthcare of some variant 11/08/15; no major change in the patient's wounds. She has been completely nonviable surface that this but is in too much pain with any attempted debridement are clinic. I have arranged for her to see Dr. Marla Roe ham of plastic  surgery and this appointment is on Monday. I am hopeful that they will take her to the OR for debridement, possible ACEL ultimately possible skin graft 11/22/15 no major change in the patient's wounds over her bilateral medial calcaneus medial malleolus into the lower legs. Surface on these does not look too bad however on the left there is surrounding erythema and tenderness. This may be cellulitis or could him sleepy tinea. 11/29/15; no major changes in the patient's wounds over her bilateral medial malleolus. There is no infection here and I don't think any additional antibiotics are necessary. There is now plan to move forward. She sees Dr. Marla Roe in a week's time for preparation for operative debridement and ACEL placement I believe on 7/12. She then has a follow-up appointment with Dr. Marla Roe on 7/21 12/28/15; the patient returns today having been taken to the [facility] by Dr. Marla Roe 12/12/15 she underwent debridement, intraoperative cultures [which were negative]. She had placement of a wound VAC. Parent really ACEL was not available to be placed. The wound VAC foam apparently adhered to the wound since then she's been using silver alginate, Xeroform under Ace wraps. She still says there is a lot of drainage and a lot of pain 01/31/16; this is a patient I see monthly. I had referred her to Dr. Marla Roe him of plastic surgery for large wounds on her bilateral medial ankles. She has been to the OR twice once in early July and once in early August. She tells me over the last 3 weeks she has been using the wound VAC with ACEL underneath it. On the right we've simply been using silver alginate. Under Kerlix Coban wraps. 02/28/16; this is a patient I'm currently seeing monthly. She is gone on to have a skin graft over her large venous insufficiency ulcer on the left medial ankle. This was done by Dr. Marla Roe him. The patient is a bit perturbed about why she didn't have one on her right medial  ankle wound. She has been using silver alginate to this. 03/06/16; I received a phone call from her plastic surgery Dr. Marla Roe. She expressed some concern about the viability of the skin graft she did on the left medial ankle wound. Asked me to place Endoform on this. She told me she is not planning to do a subsequent skin graft on the right as the left one did not take very well. I had placed Hydrofera Blue on the right 03/13/16; continue to have a reasonably healthy wound on the right medial ankle. Down to 3 mm in terms of size. There is epithelialization here. The area on the left medial  ankle is her skin graft site. I suppose the last week this looks somewhat better. She has an open area inferiorly however in the center there appears to be some viable tissue. There is a lot of surface callus and eschar that will eventually need to come off however none of this looked to be infected. Patient states that the is able to keep the dressing on for several days which is an improvement. 03/20/16 no major change in the circumference of either wound however on the left side the patient was at Dr. Eusebio Friendly office and they did a debridement of left wound. 50% of the wound seems to be epithelialized. I been using Endoform on the left Hydrofera Blue in the right 03/27/16; she arrives today with her wound is not looking as healthy as they did last week. The area on the right clearly has an adherent surface to this a very similar surface on the left. Unfortunately for this patient this is all too familiar problem. Clearly the Endoform is not working and will need to change that today that has some potential to help this surface. She does not tolerate debridement in this clinic very well. She is changing the dressing wants 04/03/16; patient arrives with the wounds looking somewhat better especially on the right. Dr. Migdalia Dk change the dressing to silver alginate when she saw her on Monday and also sold her  some compression socks. The usefulness of the latter is really not clear and woman with severely draining wounds. 04/10/16; the patient is doing a bit of an experiment wearing the compression stockings that Dr. Migdalia Dk provided her to her left leg and the out of legs based dressings that we provided to the right. 05/01/16; the patient is continuing to wear compression stockings Dr. Migdalia Dk provided her on the left that are apparently silver impregnated. She has been using Iodoflex to the right leg wound. Still a moderate amount of drainage, when she leaves here the wraps only last for 4 days. She has to change the stocking on the left leg every night 05/15/16; she is now using compression stockings bilaterally provided by Dr. Marla Roe. She is wearing a nonadherent layer over the wounds so really I don't think there is anything specific being done to this now. She has some reduction on the left wound. The right is stable. I think all healing here is being done without a specific dressing 06/09/16; patient arrives here today with not much change in the wound certainly in diameter to large circular wounds over the medial aspect of her ankle bilaterally. Under the light of these services are certainly not viable for healing. There is no evidence of surrounding infection. She is wearing compression stockings with some sort of silver impregnation as prescribed by Dr. Marla Roe. She has a follow-up with her tomorrow. 06/30/16; no major change in the size or condition of her wounds. These are still probably covered with a nonviable surface. She is using only her purchase stockings. She did see Dr. Marla Roe who seemed to want to apply Dakin's solution to this I'm not extreme short what value this would be. I would suggest Iodoflex which she still has at home. 07/28/16; I follow Mrs. Spraggins episodically along with Dr. Marla Roe. She has very refractory venous insufficiency wounds on her bilateral medial legs  left greater than right. She has been applying a topical collagen ointment to both wounds with Adaptic. I don't think Dr. Marla Roe is planning to take her back to the OR. 08/19/16; I follow  Mrs. Consuegra on a monthly basis along with Dr. Marla Roe of plastic surgery. 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 I been following her for a number of years. At one point I was able to get the right medial malleolus wound to heal and had the left medial malleolus down to about half its current size however and I had to send her to plastic surgery for an operative debridement. Since then things have been stable to slightly improve the area on the right is slightly better one in the left about the same although there is much less adherent surface than I'm used to with this patient. She is using some form of liquid collagen gel that Dr. Marla Roe provided a Kerlix cover with the patient's own pressure stockings. She tells me that she has extreme pain in both ankles and along the lateral aspect of both feet. She has been unable to work for some period of time. She is telling me she is retiring at the beginning of April. She sees Dr. Doran Durand of orthopedics next week 09/22/16; patient has not seen Dr. Marla Roe since the last time she is here. I'm not really sure what she is using to the wounds other than bits and pieces of think she had left over including most recently Hydrofera Blue. She is using juxtalite stockings. She is having difficulty with her husband's recent illness "stroke". She is having to transport him to various doctors appointments. Dr. Marla Roe left her the option of a repeat debridement with ACEL however she has not been able to get the time to follow-up on this. She continues to have a fair amount of drainage out of these wounds with certainly precludes leaving dressings on all week 10/13/16; patient has not seen Dr.  Marla Roe since she was last in our clinic. I'm not really sure what she is doing with the wounds, we did try to get her [facility] and I think she is actually using this most of the time. Because of drainage she states she has to change this every second day although this is an improvement from what she used to do. She went to see Dr. Doran Durand who did not think she had a muscular issue with regards to her feet, he referred her to a neurologist and I think the appointment is sometime in June. I changed her back to Iodoflex which she has used in the past but not recently. 11/03/16; the patient has been using Iodoflex although she ran out of this. Still claims that there is a lot of drainage although the wound does not look like this. No surrounding erythema. She has not been back to see Dr. Marla Roe 11/24/16; the patient has been using Iodoflex again but she ran out of it 2 or 3 days ago. There is no major change in the condition of either one of these wounds in fact they are larger and covered in a thick adherent surface slough/nonviable tissue especially on the left. She does not tolerate mechanical debridement in our clinic. Going back to see Dr. Marla Roe of plastic surgery for an operative debridement would seem reasonable. 12/15/16; the patient has not been back to see Dr. Marla Roe. She is been dealing with a series of illnesses and her husband which of monopolized her time. She is been using Sorbact which we largely supplied. She states the drainage is bad enough that it maximum she can go 2-3 days without changing the dressing 01/12/2017 --  the patient has not been back for about 4 weeks and has not seen Dr. Marla Roe not does she have any appointment pending. 01/23/17; patient has not seen Dr. Marla Roe even though I suggested this previously. She is using Santyl that was suggested last week by Dr. Con Memos this Cost her $16 through her insurance which is indeed surprising 02/12/17;  continuing Santyl and the patient is changing this daily. A lot of drainage. She has not been back to see plastic surgery she is using an Ace wrap. Our intake nurse suggested wrap around stockings which would make a good reasonable alternative 02/26/17; patient is been using Santyl and changing this daily due to drainage. She has not been to see plastic surgery she uses in April Ace wrap to control the edema. She did obtain extremitease stockings but stated that the edema in her leg was to big for these 03/20/17; patient is using Santyl and Anasept. Surfaces looked better today the area on the right is actually measuring a little smaller. She has states she has a lot of pain in her feet and ankles and is asking for a consult to pain control which I'll try to help her with through our case manager. 04/10/17; the patient arrives with better-looking wound surfaces and is slightly smaller wound on the left she is using a combination of Santyl and Anasept. She has an appointment or at least as started in the pain control center associated with [facility] 05/14/17; this is a patient who I followed for a prolonged period of time. She has venous insufficiency ulcers on her bilateral medial ankles. At one point I had this down to a much smaller wound on the left however these reopened and we've never been able to get these to heal. She has been using Santyl and Anasept gel although 2 weeks ago she ran out of the Anasept gel. She has a stable appearance of the wound. She is going to the wound care clinic at [facility]. They wanted do a nerve block/spinal block although she tells me she is reluctant to go forward with that. 05/21/17; this is a patient I have followed for many years. She has venous insufficiency ulcers on her bilateral medial ankles. Chronic pain and deformity in her ankles as well. She is been to see plastic surgery as well as orthopedics. Using PolyMem AG most recently/Kerramax/ABDs  and 2 layer compression. She has managed to keep this on and she is coming in for a nurse check to change the dressing on Tuesdays, we see her on Fridays 06/05/17; really quite a good looking surface and the area especially on the right medial has contracted in terms of dimensions. Well granulated healthy-looking tissue on both sides. Even with an open curet there is nothing that even feels abnormal here. This is as good as I've seen this in quite some time. We have been using PolyMem AG and bringing her in for a nurse check 06/12/17; really quite good surface on both of these wounds. The right medial has contracted a bit left is not. We've been using PolyMem and AG and she is coming in for a nurse visit 06/19/17; we have been using PolyMem AG and bringing her in for a nurse check. Dimensions of her wounds are not better but the surfaces looked better bilaterally. She complained of bleeding last night and the left wound and increasing pain bilaterally. She states her wound pain is more neuropathic than just the wounds. There was some suggestion that this  was radicular from her pain management doctor in talking to her it is really difficult to sort this out. 06/26/17; using PolyMem and AG and bringing her in for a nurse check as All of this and reasonably stable condition. Certainly not improved. The dimensions on the lateral part of the right leg look better but not really measuring better. The medial aspect on the left is about the same. 07/03/16; we have been using PolyMen AG and bringing her in for a nurse check to change the dressings as the wounds have drainage which precludes once weekly changing. We are using all secondary absorptive dressings.our intake nurse is brought up the idea of using a wound VAC/snap VAC on the wound to help with the drainage to see if this would result in some contraction. This is not a bad idea. The area on the right medial is actually looking smaller. Both wounds have a  reasonable-looking surface. There is no evidence of cellulitis. The edema is well controlled 07/10/17; the patient was denied for a snap VAC by her insurance. The major issue with these wounds continues to be drainage. We are using wicked PolyMem AG and she is coming in for a nurse visit to change this. The wounds are stable to slightly improved. The surface looks vibrant and the area on the right certainly has shrunk in size but very slowly 07/17/17; the patient still has large wounds on her bilateral medial malleoli. Surface of both of these wounds looks better. The dimensions seem to come and go but no consistent improvement. There is no epithelialization. We do not have options for advanced treatment products due to insurance issues. They did not approve of the wound VAC to help control the drainage. More recently we've been using PolyMem and AG wicked to allow drainage through. We have been bringing her in for a nurse visit to change this. We do not have a lot of options for wound care products and the home again due to insurance issues 07/24/17; the patient's wound actually looks somewhat better today. No drainage measurements are smaller still healthy-looking surface. We used silver collagen under PolyMen started last week. We have been bringing her in for a dressing change 07/31/17; patient's wound surface continued to look better and I think there is visible change in the dimensions of the wound on the right. Rims of epithelialization. We have been using silver collagen under PolyMen and bringing her in for a dressing change. There appears to be less drainage although she is still in need of the dressing change 08/07/17. Patient's wound surface continues to look better on both sides and the area on the right is definitely smaller. We have been using silver collagen and PolyMen. She feels that the drainage has been it has been better. I asked her about her vascular status. She went to see Dr.  Aleda Grana at [facility] and had some form of ablation. I don't have much detail on this. I haven't my notes from 2016 that she was not a candidate for any further ablation but I don't have any more information on this. We had referred her to vein and vascular I don't think she ever went. He does not have a history of PAD although I don't have any information on this either. We don't even have ABIs in our record 08/14/17; we've been using silver collagen and PolyMen cover. And putting the patient and compression. She we are bringing her in as a nurse visit to change this because ofarge  amount of drainage. We didn't the ABIs in clinic today since they had been done in many moons 1.2 bilaterally. She has been to see vein and vascular however this was at [facility] and she had ablation although I really don't have any information on this all seemed biking get a report. She is also been operatively debrided by plastic surgery and had a cell placed probably 8-12 months ago. This didn't have a major effect. We've been making some gains with current dressings 08/19/17-She is here in follow-up evaluation for bilateral medial malleoli ulcers. She continues to tolerate debridement very poorly. We will continue with recently changed topical treatment; if no significant improvement may consider switching to Iodosorb/Iodoflex. 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 She has been using silver collagen and PolyMem. I believe she has been used and tried on Iodoflex before. 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 These have been very refractory over time. We have been using silver collagen and PolyMen. She is coming in once a week for a doctor's and once a week for  nurse visits. We are actually making some progress 09/18/17; the patient's wounds are smaller especially on the right medial. She arrives today to upset to consider even washing these off with Anasept which I think is been part of the reason this is been closing. We've been using collagen covered in PolyMen otherwise. It is noted that she has a small area of folliculitis on the right medial calf that. As we are wrapping her legs I'll give her a short course of doxycycline to make sure this doesn't amount to anything. She is a long list of complaints today including imbalance, shortness of breath on exertion, inversion of her left ankle. With regards to the latter complaints she is been to see orthopedics and they offered her a tendon release surgery I believe but wanted her wounds to be closed first. I have recommended she go see her primary physician with regards to everything else. 09/25/17; patient's wounds are about the same size. We have made some progress bilaterally although not in recent weeks. She will not allow me T wash these wounds with Anasept even if she is doing her cell. Wheeze we've been using collagen covered in PolyMen. Last week she had a small area of folliculitis this is now opened into a small wound. She completed 5 days of trimethoprim sulfamethoxazole 10/02/17; unfortunately the area on her left medial ankle is worse with a larger wound area towards the Achilles. The patient complains of a lot of pain. She will not allow debridement although visually I don't think there is anything to debridement in any case. We have been using silver collagen and PolyMen for several months now. Initially we are making some progress although I'm not really seeing that today. We will move back to [facility]. His admittedly this is a bit of a repeat however I'm hoping that his situation is different now. The patient tells me she had her leg on the left give out on her yesterday this is process  some pain. 10/09/17; the patient is seen twice a week largely because of drainage issues coming out of the chronic medial bimalleolar wounds that are chronic. Last week the dimensions of the one on the left looks a  little larger I changed her to [facility]. She comes in today with a history of terrible pain in the bilateral wound areas. She will not allow debridement. She will not even allow a tissue culture. There is no surrounding erythema no no evidence of cellulitis. We have been putting her Kerlix Coban man. She will not allow more aggressive compression as there was a suggestion to put her in 3 layer wraps. 10/16/17; large wounds on her bilateral medial malleoli. These are chronic. Not much change from last week. The surface looks have healthy but absolutely no epithelialization. A lot of pain little less so of drainage. She will not allow debridement or even washing these off in the vigorous fashion with Anasept. 10/23/17; large wounds on her bilateral malleoli which are chronic. Some improvement in terms of size perhaps on the right since last time I saw these. She states that after we increased the 3 layer compression there was some bleeding, when she came in for a nurse visit she did not want 3 layer compression put back on about our nurse managed to convince her. She has known chronic venous visit issues and I'm hoping to get her to tolerate the 3 layer compression. using Hydrofera Blue 10/30/17; absolutely no change in the condition of either wound although we've had some improvement in dimensions on the right.. Attempted to put her in 3 layer compression she didn't tolerated she is back in 2 layer compression. We've been using Hydrofera Blue We looked over her past records. She had venous reflux studies in November 2016. There was no evidence of deep venous reflux on the right. Superficial vein did not show the greater saphenous vein at think this is been previously ablated the small  saphenous vein was within normal limits. The left deep venous system showed no DVT the vessels were positive for deep venous reflux in the posterior tibial veins at the ankle. The greater saphenous vein was surgically absent small saphenous vein was within normal limits. She went to vein and vascular at [facility]. I believe she had an ablation on the left greater saphenous vein. I'll update her reflux studies perhaps ever reviewed by vein and vascular. We've made absolutely no progress in these wounds. Will also try to read and TheraSkins through her insurance 11/06/17; W the patient apparently has a 2 week follow-up with vein and vascular I like him to review the whole issue with regards to her previous vascular workup by Dr. Aleda Grana. We've really made no progress on these wounds in many months. She arrives today with less viable looking surface on the left medial ankle wound. This was apparently looking about the same on Tuesday when she was here for nurse visit. 11/13/17; deep tissue culture I did last time of the left lower leg showed multiple organisms without any predominating. In particular no Staphylococcus or group A strep were isolated. We sent her for venous reflux studies. She's had a previous left greater saphenous vein stripping and I think sclerotherapy of the right greater saphenous vein. She didn't really look at the lesser saphenous vein this both wounds are on the medial aspect. She has reflux in the common femoral vein and popliteal vein and an accessory vein on the right and the common femoral vein and popliteal vein on the left. I'm going to have her go to see vein and vascular just the look over things and see if anything else beside aggressive compression is indicated here. We have not been able to  make any progress on these wounds in spite of the fact that the surface of the wounds is never look too bad. 11/20/17; no major change in the condition of the wounds. Patient  reports a large amount of drainage. She has a lot of complaints of pain although enlisting her today I wonder if some of this at least his neuropathic rather than secondary to her wounds. She has an appointment with vein and vascular on 12/30/17. The refractory nature of these wounds in my mind at least need vein and vascular to look over the wounds the recent reflux studies we did and her history to see if anything further can be done here. I also note her gait is deteriorated quite a bit. 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 She has a bilateral Trendelenburg gait. I wonder if this is neuropathic or perhaps multilevel radicular. 11/27/17; her wounds actually looks slightly better. Healthy-looking granulation tissue a scant amount of epithelialization. [place] healthcare will not pay for [employer]. They will play for tri layer Oasis and Dermagraft. This is not a diabetic ulcer. We'll try for the tri layer Oasis. She still complains of some drainage. She has a vein and vascular appointment on 12/30/17 12/04/17; the wounds visually look quite good. Healthy-looking granulation with some degree of epithelialization. We are still waiting for response to our request for trial to try layer Oasis. Her appointment with vascular to review venous and arterial issues isn't sold the end of July 7/31. Not allow debridement or even vigorous cleansing of the wound surface. 12/18/17; slightly smaller especially on the right. 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 We've been using Hydrofera Blue. We still are looking into triple layer Oasis through her insurance 01/08/18 on evaluation today patient's wound actually appears to be showing signs of good improvement at this point in time. She has been tolerating the dressing changes without complication. Fortunately there does not appear to be any evidence of infection at this point in time. We have been utilizing silver nitrate  which does seem to be of benefit for her which is also good news. Overall I'm very happy with how things seem to be both regards appearance as well as measurement. Patient did see Dr. Bridgett Larsson for evaluation on 12/30/17. In his assessment he felt that stripping would not likely add much more than chronic compression to the patient's healing process. His recommendation was to follow-up in three months with Dr. Doren Custard if she hasn't healed in order to consider referral back to you and see vascular where she previously was in a trial and was able to get her wound to heal. I'll be see what she feels she when you staying compression and he reiterated this as well. 01/13/18 on evaluation today patient appears to actually be doing very well in regard to her bilateral medial malleolus ulcers. She seems to have tolerated the chemical cauterization with silver nitrate last week she did have some pain through that evening but fortunately states that I'll be see since it seems to be doing better she is overall pleased with the progress. 01/21/18; really quite a remarkable improvement since I've last seen these wounds. We started using silver nitrate specially on the islands of hyper granulation which for some reason her around the wound circumference. This is really done quite nicely. Primary dressing Hydrofera Blue under 4 layer compression. She seems to be able to hold out without a nurse rewrap. Follow-up in 1 week 01/28/18; we've continued  the hydrofera blue but continued with chemical cauterization to the wound area that we started about a month ago for irregular hyper granulation. She is made almost stunning improvement in the overall wound dimensions. I was not really expecting this degree of improvement in these chronic wounds 02/05/18; we continue with Hydrofera Bluebut of also continued the aggressive chemical cauterization with silver nitrate. We made nice progress with the right greater than left  wound. 02/12/18. We continued with Hydrofera Blue after aggressive chemical cauterization with silver nitrate. We appear to be making nice progress with both wound areas 02/19/2018; we continue with [facility] after washing the wounds vigorously with Anasept spray and chemical cauterization with silver nitrate. We are making excellent progress. The area on the right's just about closed 02/26/2018. The area on the left medial ankle had too much necrotic debris today. I used a #5 curette we are able to get most of the soft. I continued with the silver nitrate to the much smaller wound on the right medial ankle she had a new area on her right lower pretibial area which she says was due to a role in her compression 03/05/2018; both wound areas look healthy. Not much change in dimensions from last week. I continue to use silver nitrate and Hydrofera Blue. The patient saw Dr. Doren Custard of vein and vascular. He felt she had venous stasis ulcers. He felt based on her previous arteriogram she should have adequate circulation for healing. Also she has deep venous reflux but really no significant correctable superficial venous reflux at this time. He felt we should continue with conservative management including leg elevation and compression 04/02/2018; since we last saw this woman about a month ago she had a fall apparently suffered a pelvic fracture. I did not look up the x-ray. Nevertheless because of pain she literally was bedbound for 2 weeks and had home health coming out to change the dressing. Somewhat predictably this is resulted in considerable improvement in both wound areas. The right is just about closed on the medial malleolus and the left is about half the size. 04/16/2018; both her wounds continue to go down in size. Using Hydrofera Blue. 05/07/18; both her wounds appeared to be improving especially on the right where it is almost closed. We are using Hydrofera Blue 05/14/2018; slightly worse this  week with larger wounds. Surface on the left medial not quite as good. We have been using Hydrofera Blue 05/21/18; again the wounds are slightly larger. Left medial malleolus slightly larger with eschar around the circumference. We have been using Hydrofera Blue undergoing a wraps for a prolonged period of time. This got a lot better when she was more recumbent due to a fall and a back injury. I change the primary dressing the silver alginate today. She did not tolerate a 4 layer compression previously although I may need to bring this up with her next time 05/28/2018; area on the left medial malleolus again is slightly larger with more drainage. Area on the right is roughly unchanged. She has a small area of folliculitis on the right medial just on the lower calf. This does not look ominous. 06/03/2018 left medial malleolus slightly smaller in a better looking surface. We used silver nitrate on this last time with silver alginate. The area on the right appears slightly smaller 1/10; left medial malleolus slightly smaller. Small open area on the right. We used silver nitrate and silver alginate as of 2 weeks ago. We continue with the wound and  compression. These got a lot better when she was off her feet 1/17; right medial malleolus wound is smaller. The left may be slightly smaller. Both surfaces look somewhat better. 1/24; both wounds are slightly smaller. Using silver alginate under Unna boots 1/31; both wounds appear smaller in fact the area on the right medial is just about closed. Surface eschar. We have been using silver alginate under Unna boots. The patient is less active now spends let much less time on her feet and I think this is contributed to the general improvement in the wound condition 2/7; both wounds appear smaller. I was hopeful the right medial would be closed however there there is still the same small open area. Slight amount of surface eschar on the left the dimensions are  smaller there is eschar but the wound edges appear to be free. We have been using silver alginate under Unna boot's 2/14; both wounds once again measure smaller. Circumferential eschar on the left medial. We have been using silver alginate under Unna boots with gradual improvement 2/21; the area on the right medial malleolus has healed. The area on the left is smaller. We have been using silver alginate and Unna boots. We can discharge wrapping the right leg she has 20/30 stockings at home she will need to protect the scar tissue in this area 2/28; the area on the right medial malleolus remains closed the patient has a compression stocking. The area on the left is smaller. We have been using silver alginate and Unna boots. 3/6 the area on the right medial ankle remains closed. Good edema control noted she is using her own compression stocking. The area on the left medial ankle is smaller. We have been managing this with silver alginate and Unna boots which we will continue today. 3/13; the area on the right medial ankle remains closed and I'm declaring it healed today. When necessary the left is about the same still a healthy-looking surface but no major change and wound area. No evidence of infection and using silver alginate under unna and generally making considerable improvement 3/27 the area on the right medial ankle remains closed the area on the left is about the same as last week. Certainly not any worse we have been using silver alginate under an Unna boot 4/3; the area on the right medial ankle remains closed per the patient. We did not look at this wound. The wound on the left medial ankle is about the same surface looks healthy we have been using silver alginate under an Unna boot 4/10; area on the right medial ankle remains closed per the patient. We did not look at this wound. The wound on the left medial ankle is slightly larger. The patient complains that the [facility] caused  burning pain all week. She also told us that she was a lot more active this week. Changed her back to silver alginate 4/17; right medial ankle still closed per the patient. Left medial ankle is slightly larger. Using silver alginate. She did not tolerate Hydrofera Blue on this area 4/24; right medial ankle remains closed we have not look at this. The left medial ankle continues to get larger today by about a centimeter. We have been using silver alginate under Unna boots. She complains about 4 layer compression as an alternative. She has been up on her feet working on her garden 5/8; right medial ankle remains closed we did not look at this. The left medial ankle has increased in size  about 100%. We have been using silver alginate under Unna boots. She noted increased pain this week and was not surprised that the wound is deteriorated 5/15; no major change in SA however much less erythema ( one week of doxy ocellulitis). 5/22-65 year old female returns at 1 week to the clinic for left medial ankle wound for which we have been using silver alginate under 3 layer compression She was placed on DOXY at last visit - the wound is wider at this visit. She is in 3 layer compression 5/29; change to [facility] last week. I had given her empiric doxycycline 2 weeks ago for a week. She is in 3 layer compression. She complains of a lot of pain and drainage on presentation today. 6/5; using Hydrofera Blue. I gave her doxycycline recently empirically for erythema and pain around the wound. Believe her cultures showed enterococcus which not would not have been well covered by doxycycline nevertheless the wound looks better and I don't feel specifically that the enterococcus needs to be covered. She has a new what looks like a wrap injury on her lateral left ankle. 6/12; she is using Hydrofera Blue. She has a new area on the left anterior lower tibial area. This was a wrap injury last week. 6/19; the patient is  using Hydrofera Blue. She arrived with marked inflammation and erythema around the wound and tenderness. 12/01/18 on evaluation today patient appears to be doing a little bit better based on what I'm hearing from the standpoint of lassos evaluation to this as far as the overall appearance of the wound is concerned. Then sometime substandard she typically sees Dr. Dellia Nims. Nonetheless overall very pleased with the progress that she's made up to this point. No fevers, chills, nausea, or vomiting noted at this time. 7/10; some improvement in the surface area. Aggressively debrided last week apparently. I went ahead with the debridement today although the patient does not tolerate this very well. We have been using Iodoflex. Still a fair amount of drainage 7/17; slightly smaller. Using Iodoflex. 7/24; no change from last week in terms of surface area. We have been using Iodoflex. Surface looks and continues to look somewhat better 7/31; surface area slightly smaller better looking surface. We have been using Iodoflex. This is under Unna boot compression 8/7-Patient presents at 1 week with Unna boot and Iodoflex, wound appears better 8/14-Patient presents at 1 week with Iodoflex, we use the Unna boot, wound appears to be stable better.Patient is getting Botox treatment for the inversion of the foot for tendon release, Next week 8/21; we are using Iodoflex. Unna boot. The wound is stable in terms of surface area. Under illumination there is some areas of the wound that appear to be either epithelialized or perhaps this is adherent slough at this point I was not really clear. It did not wipe off and I was reluctant to debride this today. 8/28; we are using Iodoflex in an Unna boot. Seems to be making good improvement. 9/4; using Iodoflex and wound is slightly smaller. 9/18; we are using Iodoflex with topical silver nitrate when she is here. The wound continues to be smaller 10/2; patient missed her  appointment last week due to GI issues. She left and Iodoflex based dressing on for 2 weeks. Wound is about the same size about the size of a dime on the left medial lower 10/9 we have been using Iodoflex on the medial left ankle wound. She has a new superficial probable wrap injury on the dorsal  left ankle 10/16; we have been using Hydrofera Blue since last week. This is on the left medial ankle 10/23; we have been using Hydrofera Blue since 2 weeks ago. This is on the left medial ankle. Dimensions are better 11/6; using Hydrofera Blue. I think the wound is smaller but still not closed. Left medial ankle 11/13; we have been using Hydrofera Blue. Wound is certainly no smaller this week. Also the surface not as good. This is the remanent of a very large area on her left medial ankle. 11/20; using Sorbact since last week. Wound was about the same in terms of size although I was disappointed about the surface debris 12/11; 3-week follow-up. Patient was on vacation. Wound is measuring slightly larger we have been using Sorbact. 12/18; wound is about the same size however surface looks better last week after debridement. We have been using Sorbact under compression 1/15 wound is probably twice the size of last time increased in length nonviable surface. We have been using Sorbact. She was running a mild fever and missed her appointment last week 1/22; the wound is come down in size but under illumination still a very adherent debris we have been Hydrofera Blue that I changed her to last week 1/29; dimensions down slightly. We have been using Hydrofera Blue 2/19 dimensions are the same however there is rims of epithelialization under illumination. Therefore more the surface area may be epithelialized 2/26; the patient's wound actually measures smaller. The wound looks healthy. We have been using Hydrofera Blue. I had some thoughts about running Apligraf then I still may do that however this looks so much  better this week we will delay that for now 3/5; the wound is small but about the same as last week. We have been using Hydrofera Blue. No debridement is required today. 3/19; the wound is about the size of a dime. Healthy looking wound even under illumination. We have been using Hydrofera Blue. No mechanical debridement is necessary 3/26; not much change from last week although still looks very healthy. We have been using Hydrofera Blue under Unna boots Patient was offered an ankle fusion by podiatry but not until the wound heals with a proceed with this. 4/9; the patient comes in today with her original wound on the medial ankle looking satisfactory however she has some uncontrolled swelling in the middle part of her leg with 2 new open areas superiorly just lateral to the tibia. I think this was probably a wrap issue. She said she felt uncomfortable during the week but did not call in. We have been using Hydrofera Blue 4/16; the wound on the medial ankle is about the same. She has innumerable small areas superior to this across her mid tibia. I think this is probably folliculitis. She is also been working in the yard doing a lot of sweating 4/30; the patient issue on the upper areas across her mid tibia of all healed. I think this was excessive yard work if I remember. Her wound on the medial ankle is smaller. Some debris on this we have been using Hydrofera Blue under Unna boots 5/7; mid tibia. She has been using Hydrofera Blue under an Unna wrap. She is apparently going for her ankle surgery on June 3 10/28/19-Patient returns to clinic with the ankle wound, we are using Hydrofera Blue under Unna wrap, surgery is scheduled for her left foot for June 3 so she will be back for nurse visit next week READMISSION 01/17/2020 [name] is a  65 year old woman we have had in this clinic for a long period of time with severe venous hypertension and refractory wounds on her medial lower legs and ankles  bilaterally. 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 When she was off her legs for a prolonged period example when she fell and suffered a compression fracture things would heal up quite nicely. She is now retired and we managed to heal up the right medial leg wound. The left one was very tiny last time I saw this although still refractory. 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 It got to the point where this was interfering with her walking and she elected to proceed with a ankle arthrodesis to straighten her her ankle and leave her with a functional outcome for mobilization. The patient was referred to Dr. Doren Custard and really this took some time to arrange. Dr. Doren Custard saw her on 12/07/2019. Once again he verified that she had no arterial issues. She had previously had an angiogram several years ago. Follow-up ABIs on the left showed an ABI of 1.12 with triphasic waveforms and a TBI of 0.92. She is felt to have chronic deep venous insufficiency but I do not think it was felt that anything could be done from about this from an ablation point of view. At the time Dr. Doren Custard saw this patient the wounds actually look closed via the pictures in his clinic. The patient finally underwent her surgery on 12/15/2019. This went reasonably well and there was a good anatomic outcome. She developed a small distal wound dehiscence on the lateral part of the surgical wound. However more problematically she is developed recurrence of the wound on the medial left ankle. There are actually 2 wounds here one in the distal lower leg and 1 pretty much at the level of the medial malleolus. It is a more distal area that is more problematic. She has been using Hydrofera Blue which started on Friday before this she was  simply Ace wrapping. There was a culture done that showed Pseudomonas and she is on ciprofloxacin. A recent CNS on 8/11 was negative. The patient reports some pain but I generally think this is improving. She is using a cam boot completely nonweightbearing using a walker for pivot transfers and a wheelchair 8/24; not much improvement unfortunately she has a surgical wound on the lateral part in the venous insufficiency wound medially. The bottom part of the medial insufficiency wound is still necrotic there is exposed tendon here. We have been using Hydrofera Blue under compression. Her edema control is however better 8/31; patient in for follow-up of his surgical wound on the lateral part of her left leg and chronic venous insufficiency ulcers medially. We put her back in compression last week. She comes in today with a complaint of 3 or 4 days worth of increasing pain. She felt her cam walker was rubbing on the area on the back of her heel. However there is intense erythema seems more likely she has cellulitis. She had 2 cultures done when she was seeing podiatry in the postop. One of them in late July showed Pseudomonas and she received a course of ciprofloxacin the other was negative on 8/11 she is allergic to penicillin with anaphylactoid complaints of hives oral swelling via information  in epic 9/9; when I saw this patient last week she had intense anterior erythema around her wound on the right lateral heel and ankle and also into the right medial heel. Some of this was no doubt drainage and her walker boot however I was convinced she had cellulitis. I gave her Levaquin and Bactrim she is finishing up on this now. She is following up with Dr. [name] he saw her yesterday. He is taken her out of the walking boot of course she is still nonweightbearing. Her x-ray was negative for any worrisome features such as soft tissue air etc. Things are a lot better this week. She has home health. We have been  using Hydrofera Blue under an [employer] which she put back on yesterday. I did not wrap her last week 9/17; her surrounding skin looks a lot better. In fact the area on the left lateral ankle has just a scant amount of eschar. The only remaining wound is the large area on the left medial ankle. Probably about 60% of this is healthy granulation at the surface however she has a significant divot distally. This has adherent debris in it. I been using debridement and silver collagen to try and get this area to fill-in although I do not think we have made much progress this week 9/24; the patient's wound on the left medial ankle looks a lot better. The deeper divot area distally still requires debridement but this is cleaning up quite nicely we have been using silver collagen. The patient is complaining of swelling in her foot and is worried that that is contributing to the nonhealing of the ankle wound. She is also complaining of numbness in her anterior toes 10/4; left medial ankle. The small area distally still has a divot with necrotic material that I have been debriding away. This has an undermining area. She is approved for Apligraf. She saw Dr. [name] her surgeon on 10/1. I think he declared himself is satisfied with the condition of things. Still nonweightbearing till the end of the month. 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 Her surgical wound is well closed. There is no evidence of infection 10/11; the patient arrived in clinic today with the expectation that we be able to put an Apligraf on this area after debridement however she arrives with a relatively large amount of green drainage on the dressing. The patient states that this started on Friday. She has not been systemically unwell. 10/19; culture I did last week showed both Enterococcus and Pseudomonas. I think this came in separate parts because I stopped her ciprofloxacin I gave her and prescribed her  linezolid however now looking at the final culture result this is Pseudomonas which is resistant to quinolones. She has not yet picked up the linezolid apparently phone issues. We are also trying to get a topical antibiotic out of [place] in [place] they can be applied by home health. She is still having green drainage 10/16; the patient has her topical antibiotic from [facility] in [place]. This is a compounded gel with vancomycin and ciprofloxacin and gentamicin. We are applying this on the wound bed with silver alginate over the top with Unna boot wraps. She arrives in clinic today with a lot less ominous looking drainage although she is only use this topical preparation once the second time today. She sees Dr. [name] her surgeon on Friday she has home health changing the dressing 11/2; still using her compounded topical  antibiotic under silver alginate. Surface is cleaning up there is less drainage. We had an Apligraf for her today and I elected to apply it. A light coating of her antibiotic 04/25/2020 upon evaluation today patient appears to be doing well with regard to her ankle ulcer. There is a little bit of slough noted on the surface of the wound I am can have to perform sharp debridement to clear this away today. With that being said other than that fact overall I feel like she is making progress and we do see some new epithelial growth. There is also some improvement in the depth of the wound and that distal portion. There is little bit of slough there as well. 12/7; 2-week follow-up. Apligraf #3. Dimensions are smaller. Closing in especially inferiorly. Still some surface debris. Still using the [facility] topical antibiotic but I told her that I don't think this needs to be renewed 12/21; 2-week follow-up. Apligraf #4 dimensions are smaller. Nice improvement 06/05/2020; 2-week follow-up. The patient's wound on the left medial ankle looks really excellent. Nice granulation.  Advancing epithelialization no undermining no evidence of infection. We would have to reapply for another Apligraf but with the condition of this wound I did not feel strongly about it. We used Hydrofera Blue under the same degree of compression. She follows up with Dr. [name] her surgeon a week Friday 06/13/2020 upon evaluation today patient appears to be doing excellent in regard to her wound. She has been tolerating the dressing changes without complication. Fortunately there is no signs of active infection at this time. No fevers, chills, nausea, vomiting, or diarrhea. She was using Hydrofera Blue last week. 06/20/2020 06/20/2020 on evaluation today patient actually appears to be doing excellent in regard to her wound. This is measuring better and looking much better as well. She has been using the collagen that seems to be doing better for her as well even though the [facility] was and is not sticking or feeling as rough on her wound. She did see Dr. [name] on Friday he is very pleased he also stated none of the hardware has shifted. That is great news 1/27; the patient has a small clean wound all that is remaining. I agree that this is too small to really consider an Apligraf. Under illumination the surface is looking quite good. We have been using collagen although the dimensions are not any better this week 2/2; the patient has a small clean wound on the left medial ankle. Although this left of her substantial original areas. Measurements are smaller. We have been using polymen Ag under an Haematologist. 2/10; small area on the left medial ankle. This looks clean nothing to debride however dimensions are about the same we have been using polymen I think now for 2 weeks 2/17; not much change in surface area. We have been using polymen Ag without any improvement. 3/17; 1 month follow-up. The patient has been using endoform without any improvement in fact I think this looks worse with more depth and  more expansion 3/24; no improvement. Perhaps less debris on the surface. We have been using Sorbact for 1 week 4/4; wound measures larger. She has edema in her leg and her foot which she tells as her wrap came down. We have been using Unna boots. Sorbact of the wound. She has been approved for Apligraf 09/12/2020 upon evaluation today patient appears to be doing well with regard to her wound. We did get the Apligraf reapproved which is great  news we have that available for application today. Fortunately there is no signs of infection and overall the patient seems to be doing great. The wound bed is nice and clean. 4/27; patient presents for her second application of Apligraf. She states over the past week she has been on her feet more often due to being outside in her garden. She has noted more swelling to her foot as a result. She denies increased warmth, pain or erythema to the wound site. 10/10/2020 upon evaluation today patient appears to be doing well with regard to her wound which does not appear to be quite as irritated as last week from what I am hearing. With that being said unfortunately she is having issues with some erythema and warmth to touch as well as an increase size. I do believe this likely is infected. 10/17/2020 upon evaluation today patient appears to be doing excellent in regard to her wound this is significantly improved as compared to last week. Fortunately I think that the infection is much better controlled at this point. She did have evidence of both Enterococcus as well as Staphylococcus noted on culture. Enterococcus really would not be helped significantly by the Cipro but the wound is doing so much better I am under the assumption that the Staphylococcus is probably the main organism that is causing the current infection. Nonetheless I think that she is doing excellent as far as that is concerned and I am very pleased in that regard. I would therefore recommend she  continue with the Cipro. 10/31/2020 upon evaluation today patient appears to be doing well with regard to her wound. She has been tolerating the dressing changes without complication. Fortunately there is no signs of active infection and overall I am extremely pleased with where things stand today. No fevers, chills, nausea, vomiting, or diarrhea. With that being said she does have some green drainage coming from the wound and although it looks okay I am a little concerned about the possibility of a continuing infection. Specifically with Pseudomonas. For that reason I will go ahead and send in a prescription for Cipro for her to be continued. 11/14/2020 upon evaluation today patient appears to be doing very well currently in regard to her wound on her leg. She has been tolerating the dressing changes without complication. Fortunately I feel like the infection is finally under good control here. Unfortunately we do not have the Apligraf for application today although we can definitely order to have it in place for next week. That will be her fifth and final of the current series. Nonetheless I feel like her wound is really doing quite well which is great news. 11/21/2020 upon evaluation today patient appears to be doing well with regard to her wound on the medial ankle. Fortunately I think the infection is under control and I do believe we can go ahead and reapply the Apligraf today. She is in agreement with that plan. There does not appear to be any signs of active infection at this time which is great news. No fevers, chills, nausea, vomiting, or diarrhea. 12/05/2020 upon evaluation today patient's wound bed actually showed signs of good granulation epithelization at this point. There does not appear to be any signs of infection which is great news and overall very pleased with where things stand. Overall the patient seems to be doing fairly well in my opinion with regard to her wound although I do believe  she continues to build up a lot of biofilm I  think she could benefit from using PuraPly at this point. 12/12/2020 upon evaluation today patient's wound actually appears to be doing decently well today. The Unna boot has not been quite as well-tolerated so that more uncomfortable for her and even causing some pressure over the plate on the lateral portion of her foot which is 90 where the wound is. There did not appear to be any significant deep tissue injury with that there may be a minimal change in the skin noted I think that we may want to go back to the Coflex 2 layer which is a little bit easier on her skin it seems. 12/19/2020 upon evaluation today patient actually seems to be making great progress with the PuraPly currently. She in fact seems to be much better as far as the overall appearance of the wound bed is concerned I am very happy in this regard. I do not see any signs of of infection which is great news as well. No fevers, chills, nausea, vomiting, or diarrhea. 12/26/2020 upon evaluation today patient appears to actually be doing better in regard to her wound on the left medial ankle region. The surface of the wound is actually doing significantly better which is great news. There does not appear to be any signs of infection which is also great news and in general I am extremely pleased with where we stand today. 01/02/2021 upon evaluation today patient appears to be doing well with regard to her wound. In fact this is showing signs of excellent improvement and very pleased with where things stand. In fact the last 3 appointments have all shown signs of this getting smaller which is excellent news. I have not even had to perform any debridement and today is no exception. Overall I feel like this is dramatically improved compared to previous. T oday is PuraPly application #4. 08/21/2246 upon evaluation today patient appears to be doing excellent in regard to her wound this is continue to show  signs of improvement and overall I am extremely pleased with where we stand today. She is actually here for PuraPly application #5. Every time we have applied this she is noted definite improvement on measurements. 01/23/2021 upon evaluation today patient is actually making good progress in regard to her wound. This was actually on just a little bit longer this time compared to previous due to the fact that she did have to go out of town. She is actually here for PuraPly application #6. We have definitely been seeing improvements in the overall quality of the tissue on the surface of the wound which is awesome news. In general I think that the patient seems to be continuing to make great progress here. 01/30/2021 upon evaluation today patient's wound is actually doing excellent. There is really not any significant biofilm buildup which is great news and overall I am extremely pleased with where things stand today. There does not appear to be any signs of active infection. No fevers, chills, nausea, vomiting, or diarrhea. 02/06/2021 upon evaluation today patient's wound is actually showing signs of excellent improvement which is great news. We continue to see the benefit of the PuraPly this is doing a great job the wound seems not really irritated whatsoever and is showing signs of good granulation at this point. Overall I am extremely happy with what we are seeing. The patient likewise is happy to hear all of this as well. 02/13/2021 upon evaluation today patient appears to be doing well with regard to her wound. This  again is measuring smaller today and I am very pleased in this regard. Fortunately there does not appear to be any signs of active infection at this time which is good news from a systemic standpoint. Locally there is still some significant drainage which she does have is concerned about infection locally. No fevers, chills, nausea, vomiting, or diarrhea. 02/20/2021 upon evaluation today  patient actually appears to be doing decently well in regard to her wound. She has been tolerating the dressing changes without complication. I do believe the PuraPly is helping wound bed does appear to be doing better. There is no evidence of active infection locally or systemically at this point visually although on fluorescence imaging there still appear to be bacterial load and bioburden noted. 02/27/2021 upon evaluation today patient fortunately continues to show signs of improvement with use of the PuraPly currently. Subsequently we did review her culture results and to be honest I think that the Bactrim is probably the best option to have her continue at this point. For that reason I am get a go ahead and send in a refill today for her for an additional 2 weeks. Nonetheless I think that we are making excellent progress here. It was Enterococcus and Staphylococcus that were noted she is allergic to penicillin so there is not much I can do from the Enterococcus standpoint though the staff does seem to be sensitive to the Bactrim. 03/06/2021 upon evaluation today patient appears to be doing well with regard to her wound. This is definitely showing signs of improvement which is great news and overall I am extremely pleased with where things stand today. There does not appear to be any signs of active infection which is great news and overall and I do believe that we are headed in the appropriate direction I think the PuraPly is doing an awesome job for her. 03/20/2021 upon evaluation today patient actually appears to be doing well with regard to her wound. We have been using the PuraPly although last week when I was out of the office they actually just used endoform nonetheless this still seems to be doing great. Fortunately there does not appear to be any signs of active infection which is great news overall I think the patient is making wonderful progress. 03/27/2021 upon evaluation today patient  appears to be doing well with regard to her wound in fact there is a little drainage but otherwise her does not appear to be any major signs of open wounds nor infection at this time. Overall I am extremely pleased with where things stand. No fevers, chills, nausea, vomiting, or diarrhea. 04/03/2021 upon evaluation today patient presents for reevaluation here in the clinic and overall she seems to be doing quite well. Fortunately there does not appear to be any signs of active infection which is great news and in general I am very pleased with how things appear. There is still a very small opening remaining but in general she is looking much better. Fortunately I do not think there is any need for the PuraPly at this point. 04/10/2021 upon evaluation today patient's wound is actually showing signs of excellent improvement. I am actually very pleased with where it stands today. I think she is very close to complete closure in fact this may be healed today. Either way I think that she may benefit from using her own compression socks for the next week and then will subsequently see how things stand following. 04/17/2021 upon evaluation today patient appears to  be doing well with regard to her wound on the ankle region. This is actually showing signs of excellent improvement which is great news. I do not see any signs of active infection at this time which is great and no open wound. With that being said on the lateral portion of her ankle right at the base of her plate she actually has an area of pressure that occurred over the past week when she was not in the wrap or we been padding her. This does seem to be an issue which I think is good to be an ongoing problem I am can actually get in touch with Dr. [name] today to see what we can do in that regard. Electronic Signature(s) Signed: 04/17/2021 3:11:24 PM By: [name] PA-C Entered By: [name] on 04/17/2021  15:11:23 -------------------------------------------------------------------------------- Physical Exam Details Patient Name: Date of Service: [facility] [name], [name] 04/17/2021 12:30 PM Medical Record Number: [id number] Patient Account Number: [id number] Date of Birth/Sex: Treating RN: 06-Jul-1955 (65 y.o. [name] Primary Care Provider: [name] Other Clinician: Referring Provider: Treating Provider/Extender: [name] in Treatment: 77 Constitutional Well-nourished and well-hydrated in no acute distress. Respiratory normal breathing without difficulty. Psychiatric this patient is able to make decisions and demonstrates good insight into disease process. Alert and Oriented x 3. pleasant and cooperative. Notes Upon inspection patient's wound appears to be completely closed today which is great news. This was the case last week as well and nothing is changed in that regard. The lateral foot however does appear to have a beginnings of a pressure injury where the plate is I did actually speak with Dr. [name] today. He is going to try to remove her fitting up for her brace so that hopefully we get something that will appropriately offload this area and prevent this from cause an ongoing problem for her. I think that is absolutely a good plan. I discussed this with the patient as well. Electronic Signature(s) Signed: 04/17/2021 3:21:01 PM By: [name] PA-C Entered By: [name] on 04/17/2021 15:21:01 -------------------------------------------------------------------------------- Physician Orders Details Patient Name: Date of Service: [facility] [name], [name] 04/17/2021 12:30 PM Medical Record Number: [id number] Patient Account Number: [id number] Date of Birth/Sex: Treating RN: 1955/10/31 (65 y.o. [name] Primary Care Provider: [name] Other Clinician: Referring Provider: Treating Provider/Extender: [name] in Treatment: 84 Verbal / Phone Orders: No Diagnosis Coding ICD-10 Coding Code Description [phone] Chronic venous hypertension (idiopathic) with ulcer and inflammation of left lower extremity L97.828 Non-pressure chronic ulcer of other part of left lower leg with other specified severity L97.328 Non-pressure chronic ulcer of left ankle with other specified severity Follow-up Appointments ppointment in 1 week. - with Margarita Grizzle Return A Edema Control - Lymphedema / SCD / Other Patient to wear own compression stockings every day. Non Wound Condition pply the following to affected area as directed: - Apply silicone bordered foam to left lateral foot for protection until your f/u with Dr. Kathlen Brunswick Electronic Signature(s) Signed: 04/17/2021 4:59:49 PM By: [name] PA-C Signed: 04/17/2021 5:29:48 PM By: [name] Entered By: [name] on 04/17/2021 13:00:59 -------------------------------------------------------------------------------- Problem List Details Patient Name: Date of Service: [name] [name], [name] 04/17/2021 12:30 PM Medical Record Number: [id number] Patient Account Number: [id number] Date of Birth/Sex: Treating RN: 1956/03/14 (65 y.o. [name] Primary Care Provider: [name] Other Clinician: Referring Provider: Treating Provider/Extender: Joaquim Lai  III, Wyonia Hough, Barth Kirks Weeks in Treatment: 65 Active Problems ICD-10 Encounter Code Description Active Date MDM Diagnosis I87.332 Chronic venous hypertension (idiopathic) with ulcer and inflammation of left 01/17/2020 No Yes lower extremity L97.828 Non-pressure chronic ulcer of other part of left lower leg with other specified 01/17/2020 No Yes severity L97.328 Non-pressure chronic ulcer of left ankle with other specified severity 01/17/2020 No Yes Inactive Problems ICD-10 Code Description Active Date Inactive Date L03.116 Cellulitis of left lower limb 01/31/2020 01/31/2020 T81.31XD  Disruption of external operation (surgical) wound, not elsewhere classified, subsequent 01/17/2020 01/17/2020 encounter Resolved Problems Electronic Signature(s) Signed: 04/17/2021 12:49:50 PM By: [name] PA-C Entered By: [name] on 04/17/2021 12:49:50 -------------------------------------------------------------------------------- Progress Note Details Patient Name: Date of Service: [facility] [name], [name] 04/17/2021 12:30 PM Medical Record Number: [id number] Patient Account Number: [id number] Date of Birth/Sex: Treating RN: Feb 12, 1956 (65 y.o. [name] Primary Care Provider: [name] Other Clinician: Referring Provider: Treating Provider/Extender: [name] in Treatment: 42 Subjective Chief Complaint Information obtained from Patient patient is been followed long-term in this clinic for venous insufficiency ulcers with inflammation, hypertension and ulceration over the medial ankle bilaterally. 01/17/2020; this is a patient who is here for review of postoperative wounds on the left lateral ankle and recurrence of venous stasis ulceration on the left medial History of Present Illness (HPI) the remaining wound is over the left medial ankle. Similar wound over the right medial ankle healed largely with use of Apligraf. Most recently we have been using Hydrofera Blue over this wound with considerable improvement. The patient has been extensively worked up in the past for her venous insufficiency and she is not a candidate for antireflux surgery although I have none of the details available currently. 08/24/14; considerable improvement today. About 50% of this wound areas now epithelialized. The base of the wound appears to be healthier granulation.as opposed to last week when she had deteriorated a considerable improvement 08/17/14; unfortunately the wound has regressed somewhat. The areas of epithelialization from the superior aspect are  not nearly as healthy as they were last week. The patient thinks her Hydrofera Blue slipped. 09/07/14; unfortunately the area has markedly regressed in the 2 weeks since I've seen this. There is an odor surrounding erythema. The healthy granulation tissue that we had at the base of the wound now is a dusky color. The nurse reports green drainage 09/14/14; the area looks somewhat better than last week. There is less erythema and less drainage. The culture I did did not show any growth. Nevertheless I think it is better to continue the Cipro and doxycycline for a further week. The remaining wound area was debridement. 09/21/14. Wound did not require debridement last week. Still less erythema and less drainage. She can complete her antibiotics. The areas of epithelialization in the superior aspect of the wound do not look as healthy as they did some weeks ago 10/05/14 continued improvement in the condition of this wound. There is advancing epithelialization. Less aggressive debridement required 10/19/14 continued improvement in the condition and volume of this wound. Less aggressive debridement to the inferior part of this to remove surface slough and fibrinous eschar 11/02/14 no debridement is required. The surface granulation appears healthy although some of her islands of epithelialization seem to have regressed. No evidence of infection 11/16/14; lites surface debridement done of surface eschar. The wound does not look to be unhealthy. No evidence of infection. Unfortunately the patient has had podiatry issues  in the right foot and for some reason has redeveloped small surface ulcerations in the medial right ankle. Her original presentation involved wounds in this area 11/23/14 no debridement. The area on the right ankle has enlarged. The left ankle wound appears stable in terms of the surface although there is periwound inflammation. There has been regression in the amount of new skin 11/30/14 no  debridement. Both wound areas appear healthy. There was no evidence of infection. The the new area on the right medial ankle has enlarged although that both the surfaces appear to be stable. 12/07/14; Debridement of the right medial ankle wound. No no debridement was done on the left. 12/14/14 no major change in and now bilateral medial ankle wounds. Both of these are very painful but the no overt evidence of infection. She has had previous venous ablation 12/21/14; patient states that her right medial ankle wound is considerably more painful last week than usual. Her left is also somewhat painful. She could not tolerate debridement. The right medial ankle wound has fibrinous surface eschar 12/28/14 this is a patient with severe bilateral venous insufficiency ulcers. For a considerable period of time we actually had the one on the right medial ankle healed however this recently opened up again in June. The left medial ankle wound has been a refractory area with some absent flows. We had some success with Hydrofera Blue on this area and it literally closed by 50% however it is recently opened up Foley. Both of these were debridement today of surface eschar. She tolerates this poorly 01/25/15: No change in the status of this. Thick adherent escar. Very poor tolerance of any attempt at debridement. I had healed the right medial malleolus wound for a considerable amount of time and had the left one down to about 50% of the volume although this is totally regressed over the last 48 weeks. Further the right leg has reopened. she is trying to make a appointment with pain and vascular, previous ablations with Dr. Aleda Grana. I do not believe there is an arterial insufficiency issue here 02/01/15 the status of the adherent eschar bilaterally is actually improved. No debridement was done. She did not manage to get vascular studies done 02/08/15 continued debridement of the area was done today. The slough is less  adherent and comes off with less pressure. There is no surrounding infection peripheral pulses are intact 02/15/15 selective debridement with a disposable curette. Again the slough is less adherent and comes off with less difficulty. No surrounding infection peripheral pulses are intact. 02/22/15 selective debridement of the right medial ankle wound. Slough comes off with less difficulty. No obvious surrounding infection peripheral pulses are intact I did not debridement the one on the left. Both of these are stable to improved 03/01/15 selective debridement of both wound areas using a curette to. Adherent slough cup soft with less difficulty. No obvious surrounding infection. The patient tells me that 2 days ago she noted a rash above the right leg wrap. She did not have this on her lower legs when she change this over she arrives with widespread left greater than right almost folliculitis-looking rash which is extremely pruritic. I don't see anything to culture here. There is no rash on the rest of her body. She feels well systemically. 03/08/15; selective debridement of both wounds using a curette. Base of this does not look unhealthy. She had limegreen drainage coming out of the left leg wound and describes a lot of drainage. The rash on her  left leg looks improved to. No cultures were done. 03/22/15; patient was not here last week. Basal wounds does not look healthy and there is no surrounding erythema. No drainage. There is still a rash on the left leg that almost looks vasculitic however it is clearly limited to the top of where the wrap would be. 04/05/15; on the right required a surgical debridement of surface eschar and necrotic subcutaneous tissue. I did not debridement the area on the left. These continue to be large open wounds that are not changing that much. We were successful at one point in healing the area on the right, and at the same time the area on the left was roughly half the size  of current measurements. I think a lot of the deterioration has to do with the prolonged time the patient is on her feet at work 04/19/15 I attempted-like surface debridement bilaterally she does not tolerate this. She tells me that she was in allergic care yesterday with extreme pain over her left lateral malleolus/ankle and was told that she has an "sprain" 05/03/15; large bilateral venous insufficiency wounds over the medial malleolus/medial aspect of her ankles. She complains of copious amounts of drainage and his usual large amounts of pain. There is some increasing erythema around the wound on the right extending into the medial aspect of her foot to. historically she came in with these wounds the right one healed and the left one came down to roughly half its current size however the right one is reopened and the left is expanded. 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 05/10/15 large bilateral venous insufficiency wounds. There is less adherence surface left however the surface culture that I did last week grew pseudomonas therefore bilateral selective debridement score necessary. There is surrounding erythema. The patient describes severe bilateral drainage and a lot of pain in the left ankle. Apparently her podiatrist was were ready to do a cortisone shot 05/17/15; the patient complains of pain and again copious amounts of drainage. 05/24/15; we used Iodo flex last week. Patient notes considerable improvement in wound drainage. Only needed to change this once. 05/31/15; we continued Iodoflex; the base of these large wounds bilaterally is not too bad but there is probably likely a significant bioburden here. I would like to debridement just doesn't tolerate it. 06/06/14 I would like to continue the Iodoflex although she still hasn't managed to obtain supplies. She has bilateral medial malleoli or large wounds which are mostly superficial.  Both of them are covered circumferentially with some nonviable fibrinous slough although she tolerates debridement very poorly. She apparently has an appointment for an ablation on the right leg by interventional radiology. 06/14/15; the patient arrives with the wounds and static condition. We attempted a debridement although she does not do well with this secondary to pain. I 07/05/15; wounds are not much smaller however there appears to be a cleaner granulating base. The left has tight fibrinous slough greater than the right. Debridement is tolerated poorly due to pain. Iodoflex is done more for these wounds in any of the multitude of different dressings I have tried on the left 1 and then subsequently the right. 07/12/15; no change in the condition of this wound. I am able to do an aggressive debridement on the right but not the left. She simply cannot tolerate it. We have been using Iodoflex which helps somewhat. It is worthwhile remembering that  at one point we healed the right medial ankle wound and the left was about 25% of the current circumference. We have suggested returning to vascular surgery for review of possible further ablations for one reason or another she has not been able to do this. 07/26/15 no major change in the condition of either wound on her medial ankle. I did not attempt to debridement of these. She has been aggressively scrubbing these while she is in the shower at home. She has her supply of Iodoflex which seems to have done more for these wounds then anything I have put on recently. 08/09/15 wound area appears larger although not verified by measurements. Using Iodoflex 09/05/2015 -- she was here for avisit today but had significant problems with the wound and I was asked to see her for a physician opinion. I have summarize that this lady has had surgery on her left lower extremity about 10 years ago where the possible veins stripping was done. She has had an opinion from  interventional radiology around November 2016 where no further sclerotherapy was ordered. The patient works 12 hours a day and stands on a concrete floor with work boots and is unable to get the proper compression she requires and cannot elevate her limbs appropriately at any given time. She has recently grown Pseudomonas from her wound culture but has not started her ciprofloxacin which was called in for her. 09/13/15 this continues to be a difficult situation for this patient. At one point I had this wound down to a 1.5 x 1.5" wound on her left leg. This is deteriorated and the right leg has reopened. She now has substantial wounds on her medial calcaneus, malleoli and into her lower leg. One on the left has surface eschar but these are far too painful for me to debridement here. She has a vascular surgery appointment next week to see if anything can be done to help here. I think she has had previous ablations several years ago at [facility]. She has no major edema. She tells me that she did not get product last time [facility]] and went for several days without it. She continues to work in work boots 12 hours a day. She cannot get compression/4-layer under her work boots. 09/20/15 no major change. Periwound edema control was not very good. Her point with pain and vascular is next Wednesday the 25th 09/28/15; the patient is seen vascular surgery and is apparently scheduled for repeat duplex ultrasounds of her bilateral lower legs next week. 10/05/15; the patient was seen by Dr. Doren Custard of vascular surgery. He feels that she should have arterial insufficiency excluded as cause/contributed to her nonhealing stage she is therefore booked for an arteriogram. She has apparently monophasic signals in the dorsalis pedis pulses. She also of course has known severe chronic venous insufficiency with previous procedures as noted previously. I had another long discussion with the patient today about her continuing  to work 12 hour shifts. I've written her out for 2 months area had concerns about this as her work location is currently undergoing significant turmoil and this may lead to her termination. She is aware of this however I agree with her that she simply cannot continue to stand for 12 hours multiple days a week with the substantial wound areas she has. 10/19/15; the Dr. Doren Custard appointment was largely for an arteriogram which was normal. She does not have an arterial issue. He didn't make a comment about her chronic venous insufficiency for which  she has had previous ablations. Presumably it was not felt that anything additional could be done. The patient is now out of work as I prescribed 2 weeks ago. Her wounds look somewhat less aggravated presumably because of this. I felt I would give debridement another try today 10/25/15; no major change in this patient's wounds. We are struggling to get her product that she can afford into her own home through her insurance. 11/01/15; no major change in the patient's wounds. I have been using silver alginate as the most affordable product. I spoke to Dr. Marla Roe last week with her requested take her to the OR for surgical debridement and placement of ACEL. Dr. Marla Roe told me that she would be willing to do this however [facility] will not cover this, fortunately the patient has [place] healthcare of some variant 11/08/15; no major change in the patient's wounds. She has been completely nonviable surface that this but is in too much pain with any attempted debridement are clinic. I have arranged for her to see Dr. Marla Roe ham of plastic surgery and this appointment is on Monday. I am hopeful that they will take her to the OR for debridement, possible ACEL ultimately possible skin graft 11/22/15 no major change in the patient's wounds over her bilateral medial calcaneus medial malleolus into the lower legs. Surface on these does not look too bad  however on the left there is surrounding erythema and tenderness. This may be cellulitis or could him sleepy tinea. 11/29/15; no major changes in the patient's wounds over her bilateral medial malleolus. There is no infection here and I don't think any additional antibiotics are necessary. There is now plan to move forward. She sees Dr. Marla Roe in a week's time for preparation for operative debridement and ACEL placement I believe on 7/12. She then has a follow-up appointment with Dr. Marla Roe on 7/21 12/28/15; the patient returns today having been taken to the [place] by Dr. Marla Roe 12/12/15 she underwent debridement, intraoperative cultures [which were negative]. She had placement of a wound VAC. Parent really ACEL was not available to be placed. The wound VAC foam apparently adhered to the wound since then she's been using silver alginate, Xeroform under Ace wraps. She still says there is a lot of drainage and a lot of pain 01/31/16; this is a patient I see monthly. I had referred her to Dr. Marla Roe him of plastic surgery for large wounds on her bilateral medial ankles. She has been to the OR twice once in early July and once in early August. She tells me over the last 3 weeks she has been using the wound VAC with ACEL underneath it. On the right we've simply been using silver alginate. Under Kerlix Coban wraps. 02/28/16; this is a patient I'm currently seeing monthly. She is gone on to have a skin graft over her large venous insufficiency ulcer on the left medial ankle. This was done by Dr. Marla Roe him. The patient is a bit perturbed about why she didn't have one on her right medial ankle wound. She has been using silver alginate to this. 03/06/16; I received a phone call from her plastic surgery Dr. Marla Roe. She expressed some concern about the viability of the skin graft she did on the left medial ankle wound. Asked me to place Endoform on this. She told me she is not planning to do a  subsequent skin graft on the right as the left one did not take very well. I had placed  Hydrofera Blue on the right 03/13/16; continue to have a reasonably healthy wound on the right medial ankle. Down to 3 mm in terms of size. There is epithelialization here. The area on the left medial ankle is her skin graft site. I suppose the last week this looks somewhat better. She has an open area inferiorly however in the center there appears to be some viable tissue. There is a lot of surface callus and eschar that will eventually need to come off however none of this looked to be infected. Patient states that the is able to keep the dressing on for several days which is an improvement. 03/20/16 no major change in the circumference of either wound however on the left side the patient was at Dr. Eusebio Friendly office and they did a debridement of left wound. 50% of the wound seems to be epithelialized. I been using Endoform on the left Hydrofera Blue in the right 03/27/16; she arrives today with her wound is not looking as healthy as they did last week. The area on the right clearly has an adherent surface to this a very similar surface on the left. Unfortunately for this patient this is all too familiar problem. Clearly the Endoform is not working and will need to change that today that has some potential to help this surface. She does not tolerate debridement in this clinic very well. She is changing the dressing wants 04/03/16; patient arrives with the wounds looking somewhat better especially on the right. Dr. Migdalia Dk change the dressing to silver alginate when she saw her on Monday and also sold her some compression socks. The usefulness of the latter is really not clear and woman with severely draining wounds. 04/10/16; the patient is doing a bit of an experiment wearing the compression stockings that Dr. Migdalia Dk provided her to her left leg and the out of legs based dressings that we provided to the  right. 05/01/16; the patient is continuing to wear compression stockings Dr. Migdalia Dk provided her on the left that are apparently silver impregnated. She has been using Iodoflex to the right leg wound. Still a moderate amount of drainage, when she leaves here the wraps only last for 4 days. She has to change the stocking on the left leg every night 05/15/16; she is now using compression stockings bilaterally provided by Dr. Marla Roe. She is wearing a nonadherent layer over the wounds so really I don't think there is anything specific being done to this now. She has some reduction on the left wound. The right is stable. I think all healing here is being done without a specific dressing 06/09/16; patient arrives here today with not much change in the wound certainly in diameter to large circular wounds over the medial aspect of her ankle bilaterally. Under the light of these services are certainly not viable for healing. There is no evidence of surrounding infection. She is wearing compression stockings with some sort of silver impregnation as prescribed by Dr. Marla Roe. She has a follow-up with her tomorrow. 06/30/16; no major change in the size or condition of her wounds. These are still probably covered with a nonviable surface. She is using only her purchase stockings. She did see Dr. Marla Roe who seemed to want to apply Dakin's solution to this I'm not extreme short what value this would be. I would suggest Iodoflex which she still has at home. 07/28/16; I follow Mrs. Petrides episodically along with Dr. Marla Roe. She has very refractory venous insufficiency wounds on her bilateral medial  legs left greater than right. She has been applying a topical collagen ointment to both wounds with Adaptic. I don't think Dr. Marla Roe is planning to take her back to the OR. 08/19/16; I follow Mrs. Jeneen Rinks on a monthly basis along with Dr. Marla Roe of plastic surgery. She has very refractory venous  insufficiency wounds on the bilateral medial lower legs left greater than right. I been following her for a number of years. At one point I was able to get the right medial malleolus wound to heal and had the left medial malleolus down to about half its current size however and I had to send her to plastic surgery for an operative debridement. Since then things have been stable to slightly improve the area on the right is slightly better one in the left about the same although there is much less adherent surface than I'm used to with this patient. She is using some form of liquid collagen gel that Dr. Marla Roe provided a Kerlix cover with the patient's own pressure stockings. She tells me that she has extreme pain in both ankles and along the lateral aspect of both feet. She has been unable to work for some period of time. She is telling me she is retiring at the beginning of April. She sees Dr. Doran Durand of orthopedics next week 09/22/16; patient has not seen Dr. Marla Roe since the last time she is here. I'm not really sure what she is using to the wounds other than bits and pieces of think she had left over including most recently Hydrofera Blue. She is using juxtalite stockings. She is having difficulty with her husband's recent illness "stroke". She is having to transport him to various doctors appointments. Dr. Marla Roe left her the option of a repeat debridement with ACEL however she has not been able to get the time to follow-up on this. She continues to have a fair amount of drainage out of these wounds with certainly precludes leaving dressings on all week 10/13/16; patient has not seen Dr. Marla Roe since she was last in our clinic. I'm not really sure what she is doing with the wounds, we did try to get her [facility] and I think she is actually using this most of the time. Because of drainage she states she has to change this every second day although this is an improvement from what  she used to do. She went to see Dr. Doran Durand who did not think she had a muscular issue with regards to her feet, he referred her to a neurologist and I think the appointment is sometime in June. I changed her back to Iodoflex which she has used in the past but not recently. 11/03/16; the patient has been using Iodoflex although she ran out of this. Still claims that there is a lot of drainage although the wound does not look like this. No surrounding erythema. She has not been back to see Dr. Marla Roe 11/24/16; the patient has been using Iodoflex again but she ran out of it 2 or 3 days ago. There is no major change in the condition of either one of these wounds in fact they are larger and covered in a thick adherent surface slough/nonviable tissue especially on the left. She does not tolerate mechanical debridement in our clinic. Going back to see Dr. Marla Roe of plastic surgery for an operative debridement would seem reasonable. 12/15/16; the patient has not been back to see Dr. Marla Roe. She is been dealing with a series of illnesses and  her husband which of monopolized her time. She is been using Sorbact which we largely supplied. She states the drainage is bad enough that it maximum she can go 2-3 days without changing the dressing 01/12/2017 -- the patient has not been back for about 4 weeks and has not seen Dr. Marla Roe not does she have any appointment pending. 01/23/17; patient has not seen Dr. Marla Roe even though I suggested this previously. She is using Santyl that was suggested last week by Dr. Con Memos this Cost her $16 through her insurance which is indeed surprising 02/12/17; continuing Santyl and the patient is changing this daily. A lot of drainage. She has not been back to see plastic surgery she is using an Ace wrap. Our intake nurse suggested wrap around stockings which would make a good reasonable alternative 02/26/17; patient is been using Santyl and changing this daily due to  drainage. She has not been to see plastic surgery she uses in April Ace wrap to control the edema. She did obtain extremitease stockings but stated that the edema in her leg was to big for these 03/20/17; patient is using Santyl and Anasept. Surfaces looked better today the area on the right is actually measuring a little smaller. She has states she has a lot of pain in her feet and ankles and is asking for a consult to pain control which I'll try to help her with through our case manager. 04/10/17; the patient arrives with better-looking wound surfaces and is slightly smaller wound on the left she is using a combination of Santyl and Anasept. She has an appointment or at least as started in the pain control center associated with [facility] 05/14/17; this is a patient who I followed for a prolonged period of time. She has venous insufficiency ulcers on her bilateral medial ankles. At one point I had this down to a much smaller wound on the left however these reopened and we've never been able to get these to heal. She has been using Santyl and Anasept gel although 2 weeks ago she ran out of the Anasept gel. She has a stable appearance of the wound. She is going to the wound care clinic at [facility]. They wanted do a nerve block/spinal block although she tells me she is reluctant to go forward with that. 05/21/17; this is a patient I have followed for many years. She has venous insufficiency ulcers on her bilateral medial ankles. Chronic pain and deformity in her ankles as well. She is been to see plastic surgery as well as orthopedics. Using PolyMem AG most recently/Kerramax/ABDs and 2 layer compression. She has managed to keep this on and she is coming in for a nurse check to change the dressing on Tuesdays, we see her on Fridays 06/05/17; really quite a good looking surface and the area especially on the right medial has contracted in terms of dimensions. Well granulated  healthy-looking tissue on both sides. Even with an open curet there is nothing that even feels abnormal here. This is as good as I've seen this in quite some time. We have been using PolyMem AG and bringing her in for a nurse check 06/12/17; really quite good surface on both of these wounds. The right medial has contracted a bit left is not. We've been using PolyMem and AG and she is coming in for a nurse visit 06/19/17; we have been using PolyMem AG and bringing her in for a nurse check. Dimensions of her wounds are not better but  the surfaces looked better bilaterally. She complained of bleeding last night and the left wound and increasing pain bilaterally. She states her wound pain is more neuropathic than just the wounds. There was some suggestion that this was radicular from her pain management doctor in talking to her it is really difficult to sort this out. 06/26/17; using PolyMem and AG and bringing her in for a nurse check as All of this and reasonably stable condition. Certainly not improved. The dimensions on the lateral part of the right leg look better but not really measuring better. The medial aspect on the left is about the same. 07/03/16; we have been using PolyMen AG and bringing her in for a nurse check to change the dressings as the wounds have drainage which precludes once weekly changing. We are using all secondary absorptive dressings.our intake nurse is brought up the idea of using a wound VAC/snap VAC on the wound to help with the drainage to see if this would result in some contraction. This is not a bad idea. The area on the right medial is actually looking smaller. Both wounds have a reasonable-looking surface. There is no evidence of cellulitis. The edema is well controlled 07/10/17; the patient was denied for a snap VAC by her insurance. The major issue with these wounds continues to be drainage. We are using wicked PolyMem AG and she is coming in for a nurse visit to change  this. The wounds are stable to slightly improved. The surface looks vibrant and the area on the right certainly has shrunk in size but very slowly 07/17/17; the patient still has large wounds on her bilateral medial malleoli. Surface of both of these wounds looks better. The dimensions seem to come and go but no consistent improvement. There is no epithelialization. We do not have options for advanced treatment products due to insurance issues. They did not approve of the wound VAC to help control the drainage. More recently we've been using PolyMem and AG wicked to allow drainage through. We have been bringing her in for a nurse visit to change this. We do not have a lot of options for wound care products and the home again due to insurance issues 07/24/17; the patient's wound actually looks somewhat better today. No drainage measurements are smaller still healthy-looking surface. We used silver collagen under PolyMen started last week. We have been bringing her in for a dressing change 07/31/17; patient's wound surface continued to look better and I think there is visible change in the dimensions of the wound on the right. Rims of epithelialization. We have been using silver collagen under PolyMen and bringing her in for a dressing change. There appears to be less drainage although she is still in need of the dressing change 08/07/17. Patient's wound surface continues to look better on both sides and the area on the right is definitely smaller. We have been using silver collagen and PolyMen. She feels that the drainage has been it has been better. I asked her about her vascular status. She went to see Dr. Aleda Grana at [facility] and had some form of ablation. I don't have much detail on this. I haven't my notes from 2016 that she was not a candidate for any further ablation but I don't have any more information on this. We had referred her to vein and vascular I don't think she ever went. He does not  have a history of PAD although I don't have any information on this either. We  don't even have ABIs in our record 08/14/17; we've been using silver collagen and PolyMen cover. And putting the patient and compression. She we are bringing her in as a nurse visit to change this because ofarge amount of drainage. We didn't the ABIs in clinic today since they had been done in many moons 1.2 bilaterally. She has been to see vein and vascular however this was at [facility] and she had ablation although I really don't have any information on this all seemed biking get a report. She is also been operatively debrided by plastic surgery and had a cell placed probably 8-12 months ago. This didn't have a major effect. We've been making some gains with current dressings 08/19/17-She is here in follow-up evaluation for bilateral medial malleoli ulcers. She continues to tolerate debridement very poorly. We will continue with recently changed topical treatment; if no significant improvement may consider switching to Iodosorb/Iodoflex. 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 She has been using silver collagen and PolyMem. I believe she has been used and tried on Iodoflex before. 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 These have been very refractory over time. We have been using silver collagen and PolyMen. She is coming in once a week for a doctor's and once a week for nurse visits. We are actually making some progress 09/18/17; the patient's wounds are smaller especially on the right medial. She arrives today to upset to consider even washing these off with Anasept which I think is been part of the reason this is been closing. We've been using collagen covered in PolyMen  otherwise. It is noted that she has a small area of folliculitis on the right medial calf that. As we are wrapping her legs I'll give her a short course of doxycycline to make sure this doesn't amount to anything. She is a long list of complaints today including imbalance, shortness of breath on exertion, inversion of her left ankle. With regards to the latter complaints she is been to see orthopedics and they offered her a tendon release surgery I believe but wanted her wounds to be closed first. I have recommended she go see her primary physician with regards to everything else. 09/25/17; patient's wounds are about the same size. We have made some progress bilaterally although not in recent weeks. She will not allow me T wash these wounds with Anasept even if she is doing her cell. Wheeze we've been using collagen covered in PolyMen. Last week she had a small area of folliculitis this is now opened into a small wound. She completed 5 days of trimethoprim sulfamethoxazole 10/02/17; unfortunately the area on her left medial ankle is worse with a larger wound area towards the Achilles. The patient complains of a lot of pain. She will not allow debridement although visually I don't think there is anything to debridement in any case. We have been using silver collagen and PolyMen for several months now. Initially we are making some progress although I'm not really seeing that today. We will move back to [facility]. His admittedly this is a bit of a repeat however I'm hoping that his situation is different now. The patient tells me she had her leg on the left give out on her yesterday this is process some pain.  10/09/17; the patient is seen twice a week largely because of drainage issues coming out of the chronic medial bimalleolar wounds that are chronic. Last week the dimensions of the one on the left looks a little larger I changed her to [facility]. She comes in today with a history of terrible  pain in the bilateral wound areas. She will not allow debridement. She will not even allow a tissue culture. There is no surrounding erythema no no evidence of cellulitis. We have been putting her Kerlix Coban man. She will not allow more aggressive compression as there was a suggestion to put her in 3 layer wraps. 10/16/17; large wounds on her bilateral medial malleoli. These are chronic. Not much change from last week. The surface looks have healthy but absolutely no epithelialization. A lot of pain little less so of drainage. She will not allow debridement or even washing these off in the vigorous fashion with Anasept. 10/23/17; large wounds on her bilateral malleoli which are chronic. Some improvement in terms of size perhaps on the right since last time I saw these. She states that after we increased the 3 layer compression there was some bleeding, when she came in for a nurse visit she did not want 3 layer compression put back on about our nurse managed to convince her. She has known chronic venous visit issues and I'm hoping to get her to tolerate the 3 layer compression. using Hydrofera Blue 10/30/17; absolutely no change in the condition of either wound although we've had some improvement in dimensions on the right.. Attempted to put her in 3 layer compression she didn't tolerated she is back in 2 layer compression. We've been using Hydrofera Blue We looked over her past records. She had venous reflux studies in November 2016. There was no evidence of deep venous reflux on the right. Superficial vein did not show the greater saphenous vein at think this is been previously ablated the small saphenous vein was within normal limits. The left deep venous system showed no DVT the vessels were positive for deep venous reflux in the posterior tibial veins at the ankle. The greater saphenous vein was surgically absent small saphenous vein was within normal limits. She went to vein and vascular at  [facility]. I believe she had an ablation on the left greater saphenous vein. I'll update her reflux studies perhaps ever reviewed by vein and vascular. We've made absolutely no progress in these wounds. Will also try to read and TheraSkins through her insurance 11/06/17; W the patient apparently has a 2 week follow-up with vein and vascular I like him to review the whole issue with regards to her previous vascular workup by Dr. Aleda Grana. We've really made no progress on these wounds in many months. She arrives today with less viable looking surface on the left medial ankle wound. This was apparently looking about the same on Tuesday when she was here for nurse visit. 11/13/17; deep tissue culture I did last time of the left lower leg showed multiple organisms without any predominating. In particular no Staphylococcus or group A strep were isolated. We sent her for venous reflux studies. She's had a previous left greater saphenous vein stripping and I think sclerotherapy of the right greater saphenous vein. She didn't really look at the lesser saphenous vein this both wounds are on the medial aspect. She has reflux in the common femoral vein and popliteal vein and an accessory vein on the right and the common femoral vein and popliteal  vein on the left. I'm going to have her go to see vein and vascular just the look over things and see if anything else beside aggressive compression is indicated here. We have not been able to make any progress on these wounds in spite of the fact that the surface of the wounds is never look too bad. 11/20/17; no major change in the condition of the wounds. Patient reports a large amount of drainage. She has a lot of complaints of pain although enlisting her today I wonder if some of this at least his neuropathic rather than secondary to her wounds. She has an appointment with vein and vascular on 12/30/17. The refractory nature of these wounds in my mind at least  need vein and vascular to look over the wounds the recent reflux studies we did and her history to see if anything further can be done here. I also note her gait is deteriorated quite a bit. 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 She has a bilateral Trendelenburg gait. I wonder if this is neuropathic or perhaps multilevel radicular. 11/27/17; her wounds actually looks slightly better. Healthy-looking granulation tissue a scant amount of epithelialization. [place] [facility] will not pay for [employer]. They will play for tri layer Oasis and Dermagraft. This is not a diabetic ulcer. We'll try for the tri layer Oasis. She still complains of some drainage. She has a vein and vascular appointment on 12/30/17 12/04/17; the wounds visually look quite good. Healthy-looking granulation with some degree of epithelialization. We are still waiting for response to our request for trial to try layer Oasis. Her appointment with vascular to review venous and arterial issues isn't sold the end of July 7/31. Not allow debridement or even vigorous cleansing of the wound surface. 12/18/17; slightly smaller especially on the right. 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 We've been using Hydrofera Blue. We still are looking into triple layer Oasis through her insurance 01/08/18 on evaluation today patient's wound actually appears to be showing signs of good improvement at this point in time. She has been tolerating the dressing changes without complication. Fortunately there does not appear to be any evidence of infection at this point in time. We have been utilizing silver nitrate which does seem to be of benefit for her which is also good news. Overall I'm very happy with how things seem to be both regards appearance as well as measurement. Patient did see Dr. Bridgett Larsson for evaluation on 12/30/17. In his assessment he felt that stripping would not likely add much more than chronic  compression to the patient's healing process. His recommendation was to follow-up in three months with Dr. Doren Custard if she hasn't healed in order to consider referral back to you and see vascular where she previously was in a trial and was able to get her wound to heal. I'll be see what she feels she when you staying compression and he reiterated this as well. 01/13/18 on evaluation today patient appears to actually be doing very well in regard to her bilateral medial malleolus ulcers. She seems to have tolerated the chemical cauterization with silver nitrate last week she did have some pain through that evening but fortunately states that I'll be see since it seems to be doing better she is overall pleased with the progress. 01/21/18; really quite a remarkable improvement since I've last seen these wounds. We started using silver nitrate specially on the islands of hyper granulation which for some reason her  around the wound circumference. This is really done quite nicely. Primary dressing Hydrofera Blue under 4 layer compression. She seems to be able to hold out without a nurse rewrap. Follow-up in 1 week 01/28/18; we've continued the hydrofera blue but continued with chemical cauterization to the wound area that we started about a month ago for irregular hyper granulation. She is made almost stunning improvement in the overall wound dimensions. I was not really expecting this degree of improvement in these chronic wounds 02/05/18; we continue with Hydrofera Bluebut of also continued the aggressive chemical cauterization with silver nitrate. We made nice progress with the right greater than left wound. 02/12/18. We continued with Hydrofera Blue after aggressive chemical cauterization with silver nitrate. We appear to be making nice progress with both wound areas 02/19/2018; we continue with [facility] after washing the wounds vigorously with Anasept spray and chemical cauterization with silver nitrate.  We are making excellent progress. The area on the right's just about closed 02/26/2018. The area on the left medial ankle had too much necrotic debris today. I used a #5 curette we are able to get most of the soft. I continued with the silver nitrate to the much smaller wound on the right medial ankle she had a new area on her right lower pretibial area which she says was due to a role in her compression 03/05/2018; both wound areas look healthy. Not much change in dimensions from last week. I continue to use silver nitrate and Hydrofera Blue. The patient saw Dr. Doren Custard of vein and vascular. He felt she had venous stasis ulcers. He felt based on her previous arteriogram she should have adequate circulation for healing. Also she has deep venous reflux but really no significant correctable superficial venous reflux at this time. He felt we should continue with conservative management including leg elevation and compression 04/02/2018; since we last saw this woman about a month ago she had a fall apparently suffered a pelvic fracture. I did not look up the x-ray. Nevertheless because of pain she literally was bedbound for 2 weeks and had home health coming out to change the dressing. Somewhat predictably this is resulted in considerable improvement in both wound areas. The right is just about closed on the medial malleolus and the left is about half the size. 04/16/2018; both her wounds continue to go down in size. Using Hydrofera Blue. 05/07/18; both her wounds appeared to be improving especially on the right where it is almost closed. We are using Hydrofera Blue 05/14/2018; slightly worse this week with larger wounds. Surface on the left medial not quite as good. We have been using Hydrofera Blue 05/21/18; again the wounds are slightly larger. Left medial malleolus slightly larger with eschar around the circumference. We have been using Hydrofera Blue undergoing a wraps for a prolonged period of time. This  got a lot better when she was more recumbent due to a fall and a back injury. I change the primary dressing the silver alginate today. She did not tolerate a 4 layer compression previously although I may need to bring this up with her next time 05/28/2018; area on the left medial malleolus again is slightly larger with more drainage. Area on the right is roughly unchanged. She has a small area of folliculitis on the right medial just on the lower calf. This does not look ominous. 06/03/2018 left medial malleolus slightly smaller in a better looking surface. We used silver nitrate on this last time with silver alginate. The  area on the right appears slightly smaller 1/10; left medial malleolus slightly smaller. Small open area on the right. We used silver nitrate and silver alginate as of 2 weeks ago. We continue with the wound and compression. These got a lot better when she was off her feet 1/17; right medial malleolus wound is smaller. The left may be slightly smaller. Both surfaces look somewhat better. 1/24; both wounds are slightly smaller. Using silver alginate under Unna boots 1/31; both wounds appear smaller in fact the area on the right medial is just about closed. Surface eschar. We have been using silver alginate under Unna boots. The patient is less active now spends let much less time on her feet and I think this is contributed to the general improvement in the wound condition 2/7; both wounds appear smaller. I was hopeful the right medial would be closed however there there is still the same small open area. Slight amount of surface eschar on the left the dimensions are smaller there is eschar but the wound edges appear to be free. We have been using silver alginate under Unna boot's 2/14; both wounds once again measure smaller. Circumferential eschar on the left medial. We have been using silver alginate under Unna boots with gradual improvement 2/21; the area on the right medial  malleolus has healed. The area on the left is smaller. We have been using silver alginate and Unna boots. We can discharge wrapping the right leg she has 20/30 stockings at home she will need to protect the scar tissue in this area 2/28; the area on the right medial malleolus remains closed the patient has a compression stocking. The area on the left is smaller. We have been using silver alginate and Unna boots. 3/6 the area on the right medial ankle remains closed. Good edema control noted she is using her own compression stocking. The area on the left medial ankle is smaller. We have been managing this with silver alginate and Unna boots which we will continue today. 3/13; the area on the right medial ankle remains closed and I'm declaring it healed today. When necessary the left is about the same still a healthy-looking surface but no major change and wound area. No evidence of infection and using silver alginate under unna and generally making considerable improvement 3/27 the area on the right medial ankle remains closed the area on the left is about the same as last week. Certainly not any worse we have been using silver alginate under an Unna boot 4/3; the area on the right medial ankle remains closed per the patient. We did not look at this wound. The wound on the left medial ankle is about the same surface looks healthy we have been using silver alginate under an Unna boot 4/10; area on the right medial ankle remains closed per the patient. We did not look at this wound. The wound on the left medial ankle is slightly larger. The patient complains that the [facility] caused burning pain all week. She also told us that she was a lot more active this week. Changed her back to silver alginate 4/17; right medial ankle still closed per the patient. Left medial ankle is slightly larger. Using silver alginate. She did not tolerate Hydrofera Blue on this area 4/24; right medial ankle remains  closed we have not look at this. The left medial ankle continues to get larger today by about a centimeter. We have been using silver alginate under Unna boots. She complains about  4 layer compression as an alternative. She has been up on her feet working on her garden 5/8; right medial ankle remains closed we did not look at this. The left medial ankle has increased in size about 100%. We have been using silver alginate under Unna boots. She noted increased pain this week and was not surprised that the wound is deteriorated 5/15; no major change in SA however much less erythema ( one week of doxy ocellulitis). 5/22-65 year old female returns at 1 week to the clinic for left medial ankle wound for which we have been using silver alginate under 3 layer compression She was placed on DOXY at last visit - the wound is wider at this visit. She is in 3 layer compression 5/29; change to [facility] last week. I had given her empiric doxycycline 2 weeks ago for a week. She is in 3 layer compression. She complains of a lot of pain and drainage on presentation today. 6/5; using Hydrofera Blue. I gave her doxycycline recently empirically for erythema and pain around the wound. Believe her cultures showed enterococcus which not would not have been well covered by doxycycline nevertheless the wound looks better and I don't feel specifically that the enterococcus needs to be covered. She has a new what looks like a wrap injury on her lateral left ankle. 6/12; she is using Hydrofera Blue. She has a new area on the left anterior lower tibial area. This was a wrap injury last week. 6/19; the patient is using Hydrofera Blue. She arrived with marked inflammation and erythema around the wound and tenderness. 12/01/18 on evaluation today patient appears to be doing a little bit better based on what I'm hearing from the standpoint of lassos evaluation to this as far as the overall appearance of the wound is concerned.  Then sometime substandard she typically sees Dr. Dellia Nims. Nonetheless overall very pleased with the progress that she's made up to this point. No fevers, chills, nausea, or vomiting noted at this time. 7/10; some improvement in the surface area. Aggressively debrided last week apparently. I went ahead with the debridement today although the patient does not tolerate this very well. We have been using Iodoflex. Still a fair amount of drainage 7/17; slightly smaller. Using Iodoflex. 7/24; no change from last week in terms of surface area. We have been using Iodoflex. Surface looks and continues to look somewhat better 7/31; surface area slightly smaller better looking surface. We have been using Iodoflex. This is under Unna boot compression 8/7-Patient presents at 1 week with Unna boot and Iodoflex, wound appears better 8/14-Patient presents at 1 week with Iodoflex, we use the Unna boot, wound appears to be stable better.Patient is getting Botox treatment for the inversion of the foot for tendon release, Next week 8/21; we are using Iodoflex. Unna boot. The wound is stable in terms of surface area. Under illumination there is some areas of the wound that appear to be either epithelialized or perhaps this is adherent slough at this point I was not really clear. It did not wipe off and I was reluctant to debride this today. 8/28; we are using Iodoflex in an Unna boot. Seems to be making good improvement. 9/4; using Iodoflex and wound is slightly smaller. 9/18; we are using Iodoflex with topical silver nitrate when she is here. The wound continues to be smaller 10/2; patient missed her appointment last week due to GI issues. She left and Iodoflex based dressing on for 2 weeks. Wound is about the  same size about the size of a dime on the left medial lower 10/9 we have been using Iodoflex on the medial left ankle wound. She has a new superficial probable wrap injury on the dorsal left ankle 10/16; we have  been using Hydrofera Blue since last week. This is on the left medial ankle 10/23; we have been using Hydrofera Blue since 2 weeks ago. This is on the left medial ankle. Dimensions are better 11/6; using Hydrofera Blue. I think the wound is smaller but still not closed. Left medial ankle 11/13; we have been using Hydrofera Blue. Wound is certainly no smaller this week. Also the surface not as good. This is the remanent of a very large area on her left medial ankle. 11/20; using Sorbact since last week. Wound was about the same in terms of size although I was disappointed about the surface debris 12/11; 3-week follow-up. Patient was on vacation. Wound is measuring slightly larger we have been using Sorbact. 12/18; wound is about the same size however surface looks better last week after debridement. We have been using Sorbact under compression 1/15 wound is probably twice the size of last time increased in length nonviable surface. We have been using Sorbact. She was running a mild fever and missed her appointment last week 1/22; the wound is come down in size but under illumination still a very adherent debris we have been Hydrofera Blue that I changed her to last week 1/29; dimensions down slightly. We have been using Hydrofera Blue 2/19 dimensions are the same however there is rims of epithelialization under illumination. Therefore more the surface area may be epithelialized 2/26; the patient's wound actually measures smaller. The wound looks healthy. We have been using Hydrofera Blue. I had some thoughts about running Apligraf then I still may do that however this looks so much better this week we will delay that for now 3/5; the wound is small but about the same as last week. We have been using Hydrofera Blue. No debridement is required today. 3/19; the wound is about the size of a dime. Healthy looking wound even under illumination. We have been using Hydrofera Blue. No mechanical  debridement is necessary 3/26; not much change from last week although still looks very healthy. We have been using Hydrofera Blue under Unna boots Patient was offered an ankle fusion by podiatry but not until the wound heals with a proceed with this. 4/9; the patient comes in today with her original wound on the medial ankle looking satisfactory however she has some uncontrolled swelling in the middle part of her leg with 2 new open areas superiorly just lateral to the tibia. I think this was probably a wrap issue. She said she felt uncomfortable during the week but did not call in. We have been using Hydrofera Blue 4/16; the wound on the medial ankle is about the same. She has innumerable small areas superior to this across her mid tibia. I think this is probably folliculitis. She is also been working in the yard doing a lot of sweating 4/30; the patient issue on the upper areas across her mid tibia of all healed. I think this was excessive yard work if I remember. Her wound on the medial ankle is smaller. Some debris on this we have been using Hydrofera Blue under Unna boots 5/7; mid tibia. She has been using Hydrofera Blue under an Unna wrap. She is apparently going for her ankle surgery on June 3 10/28/19-Patient returns to clinic with  the ankle wound, we are using Hydrofera Blue under Unna wrap, surgery is scheduled for her left foot for June 3 so she will be back for nurse visit next week READMISSION 01/17/2020 [name] is a 65 year old woman we have had in this clinic for a long period of time with severe venous hypertension and refractory wounds on her medial lower legs and ankles bilaterally. 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 When she was off her legs for a prolonged period example when she fell and suffered a compression fracture things would heal up quite  nicely. She is now retired and we managed to heal up the right medial leg wound. The left one was very tiny last time I saw this although still refractory. 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 It got to the point where this was interfering with her walking and she elected to proceed with a ankle arthrodesis to straighten her her ankle and leave her with a functional outcome for mobilization. The patient was referred to Dr. Doren Custard and really this took some time to arrange. Dr. Doren Custard saw her on 12/07/2019. Once again he verified that she had no arterial issues. She had previously had an angiogram several years ago. Follow-up ABIs on the left showed an ABI of 1.12 with triphasic waveforms and a TBI of 0.92. She is felt to have chronic deep venous insufficiency but I do not think it was felt that anything could be done from about this from an ablation point of view. At the time Dr. Doren Custard saw this patient the wounds actually look closed via the pictures in his clinic. The patient finally underwent her surgery on 12/15/2019. This went reasonably well and there was a good anatomic outcome. She developed a small distal wound dehiscence on the lateral part of the surgical wound. However more problematically she is developed recurrence of the wound on the medial left ankle. There are actually 2 wounds here one in the distal lower leg and 1 pretty much at the level of the medial malleolus. It is a more distal area that is more problematic. She has been using Hydrofera Blue which started on Friday before this she was simply Ace wrapping. There was a culture done that showed Pseudomonas and she is on ciprofloxacin. A recent CNS on 8/11 was negative. The patient reports some pain but I generally think this is improving. She is using a cam boot completely nonweightbearing using a walker for pivot transfers and a  wheelchair 8/24; not much improvement unfortunately she has a surgical wound on the lateral part in the venous insufficiency wound medially. The bottom part of the medial insufficiency wound is still necrotic there is exposed tendon here. We have been using Hydrofera Blue under compression. Her edema control is however better 8/31; patient in for follow-up of his surgical wound on the lateral part of her left leg and chronic venous insufficiency ulcers medially. We put her back in compression last week. She comes in today with a complaint of 3 or 4 days worth of increasing pain. She felt her cam walker was rubbing on the area on the back of her heel. However there is intense erythema seems more likely she has cellulitis. She had 2 cultures done when she was seeing podiatry in  the postop. One of them in late July showed Pseudomonas and she received a course of ciprofloxacin the other was negative on 8/11 she is allergic to penicillin with anaphylactoid complaints of hives oral swelling via information in epic 9/9; when I saw this patient last week she had intense anterior erythema around her wound on the right lateral heel and ankle and also into the right medial heel. Some of this was no doubt drainage and her walker boot however I was convinced she had cellulitis. I gave her Levaquin and Bactrim she is finishing up on this now. She is following up with Dr. [name] he saw her yesterday. He is taken her out of the walking boot of course she is still nonweightbearing. Her x-ray was negative for any worrisome features such as soft tissue air etc. Things are a lot better this week. She has home health. We have been using Hydrofera Blue under an [employer] which she put back on yesterday. I did not wrap her last week 9/17; her surrounding skin looks a lot better. In fact the area on the left lateral ankle has just a scant amount of eschar. The only remaining wound is the large area on the left medial ankle.  Probably about 60% of this is healthy granulation at the surface however she has a significant divot distally. This has adherent debris in it. I been using debridement and silver collagen to try and get this area to fill-in although I do not think we have made much progress this week 9/24; the patient's wound on the left medial ankle looks a lot better. The deeper divot area distally still requires debridement but this is cleaning up quite nicely we have been using silver collagen. The patient is complaining of swelling in her foot and is worried that that is contributing to the nonhealing of the ankle wound. She is also complaining of numbness in her anterior toes 10/4; left medial ankle. The small area distally still has a divot with necrotic material that I have been debriding away. This has an undermining area. She is approved for Apligraf. She saw Dr. [name] her surgeon on 10/1. I think he declared himself is satisfied with the condition of things. Still nonweightbearing till the end of the month. 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 Her surgical wound is well closed. There is no evidence of infection 10/11; the patient arrived in clinic today with the expectation that we be able to put an Apligraf on this area after debridement however she arrives with a relatively large amount of green drainage on the dressing. The patient states that this started on Friday. She has not been systemically unwell. 10/19; culture I did last week showed both Enterococcus and Pseudomonas. I think this came in separate parts because I stopped her ciprofloxacin I gave her and prescribed her linezolid however now looking at the final culture result this is Pseudomonas which is resistant to quinolones. She has not yet picked up the linezolid apparently phone issues. We are also trying to get a topical antibiotic out of [place] in [place] they can be applied by home health. She  is still having green drainage 10/16; the patient has her topical antibiotic from [facility] in [place]. This is a compounded gel with vancomycin and ciprofloxacin and gentamicin. We are applying this on the wound bed with silver alginate over the top with Unna boot wraps. She arrives in clinic today with a lot less  ominous looking drainage although she is only use this topical preparation once the second time today. She sees Dr. [name] her surgeon on Friday she has home health changing the dressing 11/2; still using her compounded topical antibiotic under silver alginate. Surface is cleaning up there is less drainage. We had an Apligraf for her today and I elected to apply it. A light coating of her antibiotic 04/25/2020 upon evaluation today patient appears to be doing well with regard to her ankle ulcer. There is a little bit of slough noted on the surface of the wound I am can have to perform sharp debridement to clear this away today. With that being said other than that fact overall I feel like she is making progress and we do see some new epithelial growth. There is also some improvement in the depth of the wound and that distal portion. There is little bit of slough there as well. 12/7; 2-week follow-up. Apligraf #3. Dimensions are smaller. Closing in especially inferiorly. Still some surface debris. Still using the [facility] topical antibiotic but I told her that I don't think this needs to be renewed 12/21; 2-week follow-up. Apligraf #4 dimensions are smaller. Nice improvement 06/05/2020; 2-week follow-up. The patient's wound on the left medial ankle looks really excellent. Nice granulation. Advancing epithelialization no undermining no evidence of infection. We would have to reapply for another Apligraf but with the condition of this wound I did not feel strongly about it. We used Hydrofera Blue under the same degree of compression. She follows up with Dr. [name] her surgeon a week  Friday 06/13/2020 upon evaluation today patient appears to be doing excellent in regard to her wound. She has been tolerating the dressing changes without complication. Fortunately there is no signs of active infection at this time. No fevers, chills, nausea, vomiting, or diarrhea. She was using Hydrofera Blue last week. 06/20/2020 06/20/2020 on evaluation today patient actually appears to be doing excellent in regard to her wound. This is measuring better and looking much better as well. She has been using the collagen that seems to be doing better for her as well even though the [facility] was and is not sticking or feeling as rough on her wound. She did see Dr. [name] on Friday he is very pleased he also stated none of the hardware has shifted. That is great news 1/27; the patient has a small clean wound all that is remaining. I agree that this is too small to really consider an Apligraf. Under illumination the surface is looking quite good. We have been using collagen although the dimensions are not any better this week 2/2; the patient has a small clean wound on the left medial ankle. Although this left of her substantial original areas. Measurements are smaller. We have been using polymen Ag under an Haematologist. 2/10; small area on the left medial ankle. This looks clean nothing to debride however dimensions are about the same we have been using polymen I think now for 2 weeks 2/17; not much change in surface area. We have been using polymen Ag without any improvement. 3/17; 1 month follow-up. The patient has been using endoform without any improvement in fact I think this looks worse with more depth and more expansion 3/24; no improvement. Perhaps less debris on the surface. We have been using Sorbact for 1 week 4/4; wound measures larger. She has edema in her leg and her foot which she tells as her wrap came down. We have been using  Unna boots. Sorbact of the wound. She has been approved for  Apligraf 09/12/2020 upon evaluation today patient appears to be doing well with regard to her wound. We did get the Apligraf reapproved which is great news we have that available for application today. Fortunately there is no signs of infection and overall the patient seems to be doing great. The wound bed is nice and clean. 4/27; patient presents for her second application of Apligraf. She states over the past week she has been on her feet more often due to being outside in her garden. She has noted more swelling to her foot as a result. She denies increased warmth, pain or erythema to the wound site. 10/10/2020 upon evaluation today patient appears to be doing well with regard to her wound which does not appear to be quite as irritated as last week from what I am hearing. With that being said unfortunately she is having issues with some erythema and warmth to touch as well as an increase size. I do believe this likely is infected. 10/17/2020 upon evaluation today patient appears to be doing excellent in regard to her wound this is significantly improved as compared to last week. Fortunately I think that the infection is much better controlled at this point. She did have evidence of both Enterococcus as well as Staphylococcus noted on culture. Enterococcus really would not be helped significantly by the Cipro but the wound is doing so much better I am under the assumption that the Staphylococcus is probably the main organism that is causing the current infection. Nonetheless I think that she is doing excellent as far as that is concerned and I am very pleased in that regard. I would therefore recommend she continue with the Cipro. 10/31/2020 upon evaluation today patient appears to be doing well with regard to her wound. She has been tolerating the dressing changes without complication. Fortunately there is no signs of active infection and overall I am extremely pleased with where things stand today. No  fevers, chills, nausea, vomiting, or diarrhea. With that being said she does have some green drainage coming from the wound and although it looks okay I am a little concerned about the possibility of a continuing infection. Specifically with Pseudomonas. For that reason I will go ahead and send in a prescription for Cipro for her to be continued. 11/14/2020 upon evaluation today patient appears to be doing very well currently in regard to her wound on her leg. She has been tolerating the dressing changes without complication. Fortunately I feel like the infection is finally under good control here. Unfortunately we do not have the Apligraf for application today although we can definitely order to have it in place for next week. That will be her fifth and final of the current series. Nonetheless I feel like her wound is really doing quite well which is great news. 11/21/2020 upon evaluation today patient appears to be doing well with regard to her wound on the medial ankle. Fortunately I think the infection is under control and I do believe we can go ahead and reapply the Apligraf today. She is in agreement with that plan. There does not appear to be any signs of active infection at this time which is great news. No fevers, chills, nausea, vomiting, or diarrhea. 12/05/2020 upon evaluation today patient's wound bed actually showed signs of good granulation epithelization at this point. There does not appear to be any signs of infection which is great news and overall very  pleased with where things stand. Overall the patient seems to be doing fairly well in my opinion with regard to her wound although I do believe she continues to build up a lot of biofilm I think she could benefit from using PuraPly at this point. 12/12/2020 upon evaluation today patient's wound actually appears to be doing decently well today. The Unna boot has not been quite as well-tolerated so that more uncomfortable for her and even  causing some pressure over the plate on the lateral portion of her foot which is 90 where the wound is. There did not appear to be any significant deep tissue injury with that there may be a minimal change in the skin noted I think that we may want to go back to the Coflex 2 layer which is a little bit easier on her skin it seems. 12/19/2020 upon evaluation today patient actually seems to be making great progress with the PuraPly currently. She in fact seems to be much better as far as the overall appearance of the wound bed is concerned I am very happy in this regard. I do not see any signs of of infection which is great news as well. No fevers, chills, nausea, vomiting, or diarrhea. 12/26/2020 upon evaluation today patient appears to actually be doing better in regard to her wound on the left medial ankle region. The surface of the wound is actually doing significantly better which is great news. There does not appear to be any signs of infection which is also great news and in general I am extremely pleased with where we stand today. 01/02/2021 upon evaluation today patient appears to be doing well with regard to her wound. In fact this is showing signs of excellent improvement and very pleased with where things stand. In fact the last 3 appointments have all shown signs of this getting smaller which is excellent news. I have not even had to perform any debridement and today is no exception. Overall I feel like this is dramatically improved compared to previous. T oday is PuraPly application #4. 1/61/0960 upon evaluation today patient appears to be doing excellent in regard to her wound this is continue to show signs of improvement and overall I am extremely pleased with where we stand today. She is actually here for PuraPly application #5. Every time we have applied this she is noted definite improvement on measurements. 01/23/2021 upon evaluation today patient is actually making good progress in  regard to her wound. This was actually on just a little bit longer this time compared to previous due to the fact that she did have to go out of town. She is actually here for PuraPly application #6. We have definitely been seeing improvements in the overall quality of the tissue on the surface of the wound which is awesome news. In general I think that the patient seems to be continuing to make great progress here. 01/30/2021 upon evaluation today patient's wound is actually doing excellent. There is really not any significant biofilm buildup which is great news and overall I am extremely pleased with where things stand today. There does not appear to be any signs of active infection. No fevers, chills, nausea, vomiting, or diarrhea. 02/06/2021 upon evaluation today patient's wound is actually showing signs of excellent improvement which is great news. We continue to see the benefit of the PuraPly this is doing a great job the wound seems not really irritated whatsoever and is showing signs of good granulation at this point. Overall  I am extremely happy with what we are seeing. The patient likewise is happy to hear all of this as well. 02/13/2021 upon evaluation today patient appears to be doing well with regard to her wound. This again is measuring smaller today and I am very pleased in this regard. Fortunately there does not appear to be any signs of active infection at this time which is good news from a systemic standpoint. Locally there is still some significant drainage which she does have is concerned about infection locally. No fevers, chills, nausea, vomiting, or diarrhea. 02/20/2021 upon evaluation today patient actually appears to be doing decently well in regard to her wound. She has been tolerating the dressing changes without complication. I do believe the PuraPly is helping wound bed does appear to be doing better. There is no evidence of active infection locally or systemically at this  point visually although on fluorescence imaging there still appear to be bacterial load and bioburden noted. 02/27/2021 upon evaluation today patient fortunately continues to show signs of improvement with use of the PuraPly currently. Subsequently we did review her culture results and to be honest I think that the Bactrim is probably the best option to have her continue at this point. For that reason I am get a go ahead and send in a refill today for her for an additional 2 weeks. Nonetheless I think that we are making excellent progress here. It was Enterococcus and Staphylococcus that were noted she is allergic to penicillin so there is not much I can do from the Enterococcus standpoint though the staff does seem to be sensitive to the Bactrim. 03/06/2021 upon evaluation today patient appears to be doing well with regard to her wound. This is definitely showing signs of improvement which is great news and overall I am extremely pleased with where things stand today. There does not appear to be any signs of active infection which is great news and overall and I do believe that we are headed in the appropriate direction I think the PuraPly is doing an awesome job for her. 03/20/2021 upon evaluation today patient actually appears to be doing well with regard to her wound. We have been using the PuraPly although last week when I was out of the office they actually just used endoform nonetheless this still seems to be doing great. Fortunately there does not appear to be any signs of active infection which is great news overall I think the patient is making wonderful progress. 03/27/2021 upon evaluation today patient appears to be doing well with regard to her wound in fact there is a little drainage but otherwise her does not appear to be any major signs of open wounds nor infection at this time. Overall I am extremely pleased with where things stand. No fevers, chills, nausea, vomiting,  or diarrhea. 04/03/2021 upon evaluation today patient presents for reevaluation here in the clinic and overall she seems to be doing quite well. Fortunately there does not appear to be any signs of active infection which is great news and in general I am very pleased with how things appear. There is still a very small opening remaining but in general she is looking much better. Fortunately I do not think there is any need for the PuraPly at this point. 04/10/2021 upon evaluation today patient's wound is actually showing signs of excellent improvement. I am actually very pleased with where it stands today. I think she is very close to complete closure in fact this may  be healed today. Either way I think that she may benefit from using her own compression socks for the next week and then will subsequently see how things stand following. 04/17/2021 upon evaluation today patient appears to be doing well with regard to her wound on the ankle region. This is actually showing signs of excellent improvement which is great news. I do not see any signs of active infection at this time which is great and no open wound. With that being said on the lateral portion of her ankle right at the base of her plate she actually has an area of pressure that occurred over the past week when she was not in the wrap or we been padding her. This does seem to be an issue which I think is good to be an ongoing problem I am can actually get in touch with Dr. [name] today to see what we can do in that regard. Objective Constitutional Well-nourished and well-hydrated in no acute distress. Vitals Time Taken: 12:40 PM, Height: 68 in, Temperature: 98.1 F, Pulse: 54 bpm, Respiratory Rate: 16 breaths/min, Blood Pressure: 111/65 mmHg. Respiratory normal breathing without difficulty. Psychiatric this patient is able to make decisions and demonstrates good insight into disease process. Alert and Oriented x 3. pleasant and  cooperative. General Notes: Upon inspection patient's wound appears to be completely closed today which is great news. This was the case last week as well and nothing is changed in that regard. The lateral foot however does appear to have a beginnings of a pressure injury where the plate is I did actually speak with Dr. [name] today. He is going to try to remove her fitting up for her brace so that hopefully we get something that will appropriately offload this area and prevent this from cause an ongoing problem for her. I think that is absolutely a good plan. I discussed this with the patient as well. Integumentary (Hair, Skin) Wound #15 status is Healed - Epithelialized. Original cause of wound was Gradually Appeared. The date acquired was: 12/30/2019. The wound has been in treatment 65 weeks. The wound is located on the Left,Medial Malleolus. The wound measures 0cm length x 0cm width x 0cm depth; 0cm^2 area and 0cm^3 volume. 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 - with Sammy Douthitt Edema Control - Lymphedema / SCD / Other: Patient to wear own compression stockings every day. Non Wound Condition: Apply the following to affected area as directed: - Apply silicone bordered foam to left lateral foot for protection until your f/u with Dr. [name] 1. Just to keep a close eye on things I am going to plan to see her next week for reevaluation just to make sure that this area on the lateral portion of her foot/ankle region is not attempting to open. Again we padded this very well using a foam cut in the doughnut followed by an Allevyn dressing for offloading. She tolerated this today without complication. I  am hopeful this padding will be enough to protect this region. 2. Also can recommend that we have the patient continue with a compression sock which I think is doing a good job as well. We will see patient back for reevaluation in 1 week here in the clinic. If anything worsens or changes  patient will contact our office for additional recommendations. If all things go according to plan this should be her last visit. Electronic Signature(s) Signed: 04/17/2021 3:21:47 PM By: [name] PA-C Entered By: [name] on 04/17/2021 15:21:47 -------------------------------------------------------------------------------- SuperBill Details Patient Name: Date of Service: [name] [name], [name] 04/17/2021 Medical Record Number: [id number] Patient Account Number: [id number] Date of Birth/Sex: Treating RN: February 25, 1956 (65 y.o. [name] Primary Care Provider: [name] Other Clinician: Referring Provider: Treating Provider/Extender: [name] in Treatment: 34 Diagnosis Coding ICD-10 Codes Code Description [id number] Chronic venous hypertension (idiopathic) with ulcer and inflammation of left lower extremity L97.828 Non-pressure chronic ulcer of other part of left lower leg with other specified severity L97.328 Non-pressure chronic ulcer of left ankle with other specified severity Facility Procedures CPT4 Code: [id number] Description: [phone] - WOUND CARE VISIT-LEV 2 EST PT Modifier: Quantity: 1 Physician Procedures : CPT4 Code Description Modifier [id number] - WC PHYS LEVEL 4 - EST PT ICD-10 Diagnosis Description I87.332 Chronic venous hypertension (idiopathic) with ulcer and inflammation of left lower extremity L97.828 Non-pressure chronic ulcer of other part  of left lower leg with other specified severity L97.328 Non-pressure chronic ulcer of left ankle with other specified severity Quantity: 1 Electronic Signature(s) Signed: 04/17/2021 3:22:00 PM  By: [name] PA-C Entered By: [name] on 04/17/2021 15:22:00

## 2021-04-24 ENCOUNTER — Encounter (HOSPITAL_BASED_OUTPATIENT_CLINIC_OR_DEPARTMENT_OTHER): Payer: Medicare Other | Admitting: Physician Assistant

## 2021-04-24 ENCOUNTER — Other Ambulatory Visit: Payer: Self-pay

## 2021-04-24 DIAGNOSIS — I87332 Chronic venous hypertension (idiopathic) with ulcer and inflammation of left lower extremity: Secondary | ICD-10-CM | POA: Diagnosis not present

## 2021-04-24 NOTE — Progress Notes (Signed)
PAW, KARSTENS ([id number]) Visit Report for 04/24/2021 Arrival Information Details Patient Name: Date of Service: [name], [name] 04/24/2021 9:45 A M Medical Record Number: [id number] Patient Account Number: [id number] Date of Birth/Sex: Treating RN: [name] (65 y.o. [name], [name] Primary Care [name]: [name] Other Clinician: Referring [name]: Treating [name]/Extender: [name] in Treatment: 61 Visit Information History Since Last Visit Added or deleted any medications: No Patient Arrived: Cane Any new allergies or adverse reactions: No Arrival Time: 10:09 Had a fall or experienced change in No Accompanied By: self activities of daily living that may affect Transfer Assistance: None risk of falls: Patient Identification Verified: Yes Signs or symptoms of abuse/neglect since last visito No Secondary Verification Process Completed: Yes Hospitalized since last visit: No Patient Requires Transmission-Based Precautions: No Implantable device outside of the clinic excluding No Patient Has Alerts: Yes cellular tissue based products placed in the center Patient Alerts: L ABI =1.12, TBI = .92 since last visit: R ABI= 1.02, TBI= .58 Has Dressing in Place as Prescribed: Yes Has Compression in Place as Prescribed: Yes Pain Present Now: No Electronic Signature(s) Signed: 04/24/2021 5:48:49 PM By: [name] Pilling RN, BSN Entered By: [name] on 04/24/2021 10:10:23 -------------------------------------------------------------------------------- Encounter Discharge Information Details Patient Name: Date of Service: [name], [name] 04/24/2021 9:45 A M Medical Record Number: [id number] Patient Account Number: [id number] Date of Birth/Sex: Treating RN: [birth year] [name] (65 y.o. [name], [name] Primary Care [name]: [name] Other Clinician: Referring Samiyyah Moffa: Treating [name]/Extender: [name]  in Treatment: 41 Encounter Discharge Information Items Post Procedure Vitals Discharge Condition: Stable Temperature (F): 97.4 Ambulatory Status: Ambulatory Pulse (bpm): 74 Discharge Destination: Home Respiratory Rate (breaths/min): 17 Transportation: Private Auto Blood Pressure (mmHg): 134/73 Accompanied By: self Schedule Follow-up Appointment: Yes Clinical Summary of Care: Patient Declined Electronic Signature(s) Signed: 04/24/2021 3:00:15 PM By: [name] Hammock RN Entered By: [name] on 04/24/2021 11:09:12 -------------------------------------------------------------------------------- Lower Extremity Assessment Details Patient Name: Date of Service: [facility] [name], [name] 04/24/2021 9:45 A M Medical Record Number: [id number] Patient Account Number: [id number] Date of Birth/Sex: Treating RN: [birth year] (65 y.o. [name] Primary Care Lorry Furber: [name] Other Clinician: Referring [name]: Treating [name]/Extender: [name] in Treatment: 66 Edema Assessment Assessed: [Left: Yes] [Right: No] Edema: [Left: N] [Right: o] Calf Left: Right: Point of Measurement: 31 cm From Medial Instep 28.5 cm Ankle Left: Right: Point of Measurement: 11 cm From Medial Instep 21 cm Vascular Assessment Pulses: Dorsalis Pedis Palpable: [Left:Yes] Electronic Signature(s) Signed: 04/24/2021 5:48:49 PM By: [name] Pilling RN, BSN Entered By: [name] on 04/24/2021 10:13:07 -------------------------------------------------------------------------------- [place] Details Patient Name: Date of Service: [facility] [name], [name] 04/24/2021 9:45 A M Medical Record Number: [id number] Patient Account Number: [id number] Date of Birth/Sex: Treating RN: [name] (65 y.o. [name] Primary Care [name]: [name] Other Clinician: Referring [name]: Treating [name]/Extender: [name]  in Treatment: 40 [place] reviewed with physician Active Inactive Electronic Signature(s) Signed: 04/24/2021 5:48:49 PM By: [name] Pilling RN, BSN Entered By: [name] on 04/24/2021 10:14:22 -------------------------------------------------------------------------------- Pain Assessment Details Patient Name: Date of Service: [name], [name] 04/24/2021 9:45 A M Medical Record Number: [id number] Patient Account Number: [id number] Date of Birth/Sex: Treating RN: [name] (65 y.o. [name] Primary Care [name]: [name] Other Clinician: Referring [name]: Treating [name]/Extender: [name]  in Treatment: 66 Active Problems Location of Pain Severity and Description of Pain Patient Has Paino No Site Locations Rate the pain. Current Pain Level: 0 Pain Management and Medication Current Pain Management: Medication: No Cold Application: No Rest: No Massage: No Activity: No T.E.N.S.: No Heat Application: No Leg drop or elevation: No Is the Current Pain Management Adequate: Adequate How does your wound impact your activities of daily livingo Sleep: No Bathing: No Appetite: No Relationship With Others: No Bladder Continence: No Emotions: No Bowel Continence: No Work: No Toileting: No Drive: No Dressing: No Hobbies: No Engineer, maintenance) Signed: 04/24/2021 5:48:49 PM By: [name] Pilling RN, BSN Entered By: [name] on 04/24/2021 10:10:58 -------------------------------------------------------------------------------- Patient/Caregiver Education Details Patient Name: Date of Service: [name], [name] 11/23/2022andnbsp9:45 A M Medical Record Number: [id number] Patient Account Number: [id number] Date of Birth/Gender: Treating RN: [name] (65 y.o. [name] Primary Care Physician: [name] Other Clinician: Referring Physician: Treating Physician/Extender: [name] in Treatment: 53 Education Assessment Education Provided To: Patient Education Topics Provided Wound/Skin Impairment: Handouts: Skin Care Do's and Dont's Methods: Explain/Verbal Responses: Reinforcements needed Electronic Signature(s) Signed: 04/24/2021 5:48:49 PM By: [name] Pilling RN, BSN Entered By: [name] on 04/24/2021 10:14:37 -------------------------------------------------------------------------------- Wound Assessment Details Patient Name: Date of Service: [name], [name] Kingfisher G. 04/24/2021 9:45 A M Medical Record Number: [id number] Patient Account Number: [id number] Date of Birth/Sex: Treating RN: [name] (65 y.o. [name], [name] Primary Care Finch Costanzo: [name] Other Clinician: Referring Shantrice Rodenberg: Treating Madlynn Lundeen/Extender: [name] in Treatment: 66 Wound Status Wound Number: [id number] Primary Venous Leg Ulcer Etiology: Wound Location: Left, Medial Malleolus Wound Status: Open Wounding Event: Gradually Appeared Comorbid Peripheral Venous Disease, Osteoarthritis, Confinement Date Acquired: 12/30/2019 History: Anxiety Weeks Of Treatment: 66 Clustered Wound: No Wound Measurements Length: (cm) 0.1 Width: (cm) 0.1 Depth: (cm) 0.1 Area: (cm) 0.008 Volume: (cm) 0.001 % Reduction in Area: 99.9% % Reduction in Volume: 100% Epithelialization: Large (67-100%) Tunneling: No Undermining: No Wound Description Classification: Full Thickness With Exposed Support Structures Wound Margin: Distinct, outline attached Exudate Amount: Medium Exudate Type: Serosanguineous Exudate Color: red, brown Foul [name] After Cleansing: No Slough/Fibrino No Wound Bed Granulation Amount: Large (67-100%) Exposed Structure Granulation Quality: Red, Pink Fascia Exposed: No Necrotic Amount: None Present (0%) Fat Layer (Subcutaneous Tissue) Exposed: Yes Tendon Exposed: No Muscle Exposed: No Joint Exposed: No Bone  Exposed: No Treatment Notes Wound #[id number] (Malleolus) Wound Laterality: Left, Medial Cleanser Wound Cleanser Discharge Instruction: Cleanse the wound with wound cleanser prior to applying a clean dressing using gauze sponges, not tissue or cotton balls. Peri-Wound Care Topical Primary Dressing Maxorb Extra Calcium Alginate 2x2 in Discharge Instruction: Apply calcium alginate to wound bed as instructed Secondary Dressing Woven Gauze Sponge, Non-Sterile 4x4 in Discharge Instruction: Apply over primary dressing as directed. Zetuvit Plus Silicone Border Dressing 4x4 (in/in) Discharge Instruction: Apply silicone border over primary dressing as directed. Secured With Compression Wrap 30M ACE Elastic Bandage With VELCRO Brand Closure, 4 (in) Discharge Instruction: may use instead of compression stocking for this week Compression Stockings Add-Ons Electronic Signature(s) Signed: 04/24/2021 3:00:15 PM By: [name] Hammock RN Entered By: [name] on 04/24/2021 10:52:52 -------------------------------------------------------------------------------- Vitals Details Patient Name: Date of Service: [name], [name] 04/24/2021 9:45 A M Medical Record Number: [id number] Patient Account Number: [id number] Date of Birth/Sex: Treating RN: [birth year]-[name] (65 y.o. [name] Primary Care Tykira Wachs: [name] Other Clinician: Referring Eliyah Bazzi: Treating Braylei Totino/Extender: Melburn Hake,  Dereck Ligas Weeks in Treatment: 66 Vital Signs Time Taken: 10:10 Temperature (F): 98.2 Height (in): 68 Pulse (bpm): 59 Respiratory Rate (breaths/min): 20 Blood Pressure (mmHg): 115/71 Reference Range: 80 - 120 mg / dl Electronic Signature(s) Signed: 04/24/2021 5:48:49 PM By: [name] Pilling RN, BSN Entered By: [name] on 04/24/2021 10:10:39

## 2021-04-24 NOTE — Progress Notes (Addendum)
ZURII, HEWES ([id number]) Visit Report for 04/24/2021 Chief Complaint Document Details Patient Name: Date of Service: [facility] [name], [name] 04/24/2021 9:45 A M Medical Record Number: [id number] Patient Account Number: [id number] Date of Birth/Sex: Treating RN: 03-17-1956 (65 y.o. [name] Provider: [name] Other Clinician: Referring Provider: Treating Provider/Extender: [name] in Treatment: 67 Information Obtained from: Patient Chief Complaint patient is been followed long-term in this clinic for venous insufficiency ulcers with inflammation, hypertension and ulceration over the medial ankle bilaterally. 01/17/2020; this is a patient who is here for review of postoperative wounds on the left lateral ankle and recurrence of venous stasis ulceration on the left medial Electronic Signature(s) Signed: 04/24/2021 10:21:14 AM By: [name] PA-C Entered By: [name] on 04/24/2021 10:21:14 -------------------------------------------------------------------------------- Debridement Details Patient Name: Date of Service: [facility] [name], [name] 04/24/2021 9:45 A M Medical Record Number: [id number] Patient Account Number: [id number] Date of Birth/Sex: Treating RN: 1955-09-03 (65 y.o. [name], [name] Primary Care Provider: [name] Other Clinician: Referring Provider: Treating Provider/Extender: [name] in Treatment: 66 Debridement Performed for Assessment: Wound #15R Left,Medial Malleolus Performed By: Physician [name], PA Debridement Type: Debridement Severity of Tissue Pre Debridement: Fat layer exposed Level of Consciousness (Pre-procedure): Awake and Alert Pre-procedure Verification/Time Out Yes - 10:53 Taken: Start Time: 10:53 Pain Control: Lidocaine T Area Debrided (L x W): otal 0.1 (cm) x 0.1 (cm) = 0.01 (cm) Tissue and other material debrided: Callus, Skin:  Dermis , Skin: Epidermis Level: Skin/Epidermis Debridement Description: Selective/Open Wound Instrument: Curette Bleeding: Minimum Hemostasis Achieved: Pressure End Time: 10:53 Procedural Pain: 0 Post Procedural Pain: 0 Response to Treatment: Procedure was tolerated well Level of Consciousness (Post- Awake and Alert procedure): Post Debridement Measurements of Total Wound Length: (cm) 0.1 Width: (cm) 0.1 Depth: (cm) 0.1 Volume: (cm) 0.001 Character of Wound/Ulcer Post Debridement: Improved Severity of Tissue Post Debridement: Fat layer exposed Post Procedure Diagnosis Same as Pre-procedure Electronic Signature(s) Signed: 04/24/2021 3:00:15 PM By: [name] RN Signed: 04/24/2021 5:33:12 PM By: [name] PA-C Entered By: [name] on 04/24/2021 10:54:07 -------------------------------------------------------------------------------- HPI Details Patient Name: Date of Service: [name], [name] 04/24/2021 9:45 A M Medical Record Number: [id number] Patient Account Number: [id number] Date of Birth/Sex: Treating RN: 07-28-1955 (65 y.o. [name] Provider: [name] Other Clinician: Referring Provider: Treating Provider/Extender: [name] in Treatment: 17 History of Present Illness HPI Description: the remaining wound is over the left medial ankle. Similar wound over the right medial ankle healed largely with use of Apligraf. Most recently we have been using Hydrofera Blue over this wound with considerable improvement. The patient has been extensively worked up in the past for her venous insufficiency and she is not a candidate for antireflux surgery although I have none of the details available currently. 08/24/14; considerable improvement today. About 50% of this wound areas now epithelialized. The base of the wound appears to be healthier granulation.as opposed to last week when she had deteriorated a  considerable improvement 08/17/14; unfortunately the wound has regressed somewhat. The areas of epithelialization from the superior aspect are not nearly as healthy as they were last week. The patient thinks her Hydrofera Blue slipped. 09/07/14; unfortunately the area has markedly regressed in the 2 weeks since I've seen this. There is an odor surrounding erythema. The healthy granulation tissue that we had at the  base of the wound now is a dusky color. The nurse reports green drainage 09/14/14; the area looks somewhat better than last week. There is less erythema and less drainage. The culture I did did not show any growth. Nevertheless I think it is better to continue the Cipro and doxycycline for a further week. The remaining wound area was debridement. 09/21/14. Wound did not require debridement last week. Still less erythema and less drainage. She can complete her antibiotics. The areas of epithelialization in the superior aspect of the wound do not look as healthy as they did some weeks ago 10/05/14 continued improvement in the condition of this wound. There is advancing epithelialization. Less aggressive debridement required 10/19/14 continued improvement in the condition and volume of this wound. Less aggressive debridement to the inferior part of this to remove surface slough and fibrinous eschar 11/02/14 no debridement is required. The surface granulation appears healthy although some of her islands of epithelialization seem to have regressed. No evidence of infection 11/16/14; lites surface debridement done of surface eschar. The wound does not look to be unhealthy. No evidence of infection. Unfortunately the patient has had podiatry issues in the right foot and for some reason has redeveloped small surface ulcerations in the medial right ankle. Her original presentation involved wounds in this area 11/23/14 no debridement. The area on the right ankle has enlarged. The left ankle wound appears stable  in terms of the surface although there is periwound inflammation. There has been regression in the amount of new skin 11/30/14 no debridement. Both wound areas appear healthy. There was no evidence of infection. The the new area on the right medial ankle has enlarged although that both the surfaces appear to be stable. 12/07/14; Debridement of the right medial ankle wound. No no debridement was done on the left. 12/14/14 no major change in and now bilateral medial ankle wounds. Both of these are very painful but the no overt evidence of infection. She has had previous venous ablation 12/21/14; patient states that her right medial ankle wound is considerably more painful last week than usual. Her left is also somewhat painful. She could not tolerate debridement. The right medial ankle wound has fibrinous surface eschar 12/28/14 this is a patient with severe bilateral venous insufficiency ulcers. For a considerable period of time we actually had the one on the right medial ankle healed however this recently opened up again in June. The left medial ankle wound has been a refractory area with some absent flows. We had some success with Hydrofera Blue on this area and it literally closed by 50% however it is recently opened up Foley. Both of these were debridement today of surface eschar. She tolerates this poorly 01/25/15: No change in the status of this. Thick adherent escar. Very poor tolerance of any attempt at debridement. I had healed the right medial malleolus wound for a considerable amount of time and had the left one down to about 50% of the volume although this is totally regressed over the last 48 weeks. Further the right leg has reopened. she is trying to make a appointment with pain and vascular, previous ablations with Dr. Aleda Grana. I do not believe there is an arterial insufficiency issue here 02/01/15 the status of the adherent eschar bilaterally is actually improved. No debridement was done.  She did not manage to get vascular studies done 02/08/15 continued debridement of the area was done today. The slough is less adherent and comes off with less pressure. There is  no surrounding infection peripheral pulses are intact 02/15/15 selective debridement with a disposable curette. Again the slough is less adherent and comes off with less difficulty. No surrounding infection peripheral pulses are intact. 02/22/15 selective debridement of the right medial ankle wound. Slough comes off with less difficulty. No obvious surrounding infection peripheral pulses are intact I did not debridement the one on the left. Both of these are stable to improved 03/01/15 selective debridement of both wound areas using a curette to. Adherent slough cup soft with less difficulty. No obvious surrounding infection. The patient tells me that 2 days ago she noted a rash above the right leg wrap. She did not have this on her lower legs when she change this over she arrives with widespread left greater than right almost folliculitis-looking rash which is extremely pruritic. I don't see anything to culture here. There is no rash on the rest of her body. She feels well systemically. 03/08/15; selective debridement of both wounds using a curette. Base of this does not look unhealthy. She had limegreen drainage coming out of the left leg wound and describes a lot of drainage. The rash on her left leg looks improved to. No cultures were done. 03/22/15; patient was not here last week. Basal wounds does not look healthy and there is no surrounding erythema. No drainage. There is still a rash on the left leg that almost looks vasculitic however it is clearly limited to the top of where the wrap would be. 04/05/15; on the right required a surgical debridement of surface eschar and necrotic subcutaneous tissue. I did not debridement the area on the left. These continue to be large open wounds that are not changing that much. We were  successful at one point in healing the area on the right, and at the same time the area on the left was roughly half the size of current measurements. I think a lot of the deterioration has to do with the prolonged time the patient is on her feet at work 04/19/15 I attempted-like surface debridement bilaterally she does not tolerate this. She tells me that she was in allergic care yesterday with extreme pain over her left lateral malleolus/ankle and was told that she has an "sprain" 05/03/15; large bilateral venous insufficiency wounds over the medial malleolus/medial aspect of her ankles. She complains of copious amounts of drainage and his usual large amounts of pain. There is some increasing erythema around the wound on the right extending into the medial aspect of her foot to. historically she came in with these wounds the right one healed and the left one came down to roughly half its current size however the right one is reopened and the left is expanded. 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 05/10/15 large bilateral venous insufficiency wounds. There is less adherence surface left however the surface culture that I did last week grew pseudomonas therefore bilateral selective debridement score necessary. There is surrounding erythema. The patient describes severe bilateral drainage and a lot of pain in the left ankle. Apparently her podiatrist was were ready to do a cortisone shot 05/17/15; the patient complains of pain and again copious amounts of drainage. 05/24/15; we used Iodo flex last week. Patient notes considerable improvement in wound drainage. Only needed to change this once. 05/31/15; we continued Iodoflex; the base of these large wounds bilaterally is not too bad but there is probably likely a significant bioburden  here. I would like to debridement just doesn't tolerate it. 06/06/14 I would like to continue the Iodoflex although  she still hasn't managed to obtain supplies. She has bilateral medial malleoli or large wounds which are mostly superficial. Both of them are covered circumferentially with some nonviable fibrinous slough although she tolerates debridement very poorly. She apparently has an appointment for an ablation on the right leg by interventional radiology. 06/14/15; the patient arrives with the wounds and static condition. We attempted a debridement although she does not do well with this secondary to pain. I 07/05/15; wounds are not much smaller however there appears to be a cleaner granulating base. The left has tight fibrinous slough greater than the right. Debridement is tolerated poorly due to pain. Iodoflex is done more for these wounds in any of the multitude of different dressings I have tried on the left 1 and then subsequently the right. 07/12/15; no change in the condition of this wound. I am able to do an aggressive debridement on the right but not the left. She simply cannot tolerate it. We have been using Iodoflex which helps somewhat. It is worthwhile remembering that at one point we healed the right medial ankle wound and the left was about 25% of the current circumference. We have suggested returning to vascular surgery for review of possible further ablations for one reason or another she has not been able to do this. 07/26/15 no major change in the condition of either wound on her medial ankle. I did not attempt to debridement of these. She has been aggressively scrubbing these while she is in the shower at home. She has her supply of Iodoflex which seems to have done more for these wounds then anything I have put on recently. 08/09/15 wound area appears larger although not verified by measurements. Using Iodoflex 09/05/2015 -- she was here for avisit today but had significant problems with the wound and I was asked to see her for a physician opinion. I have summarize that this lady has had surgery  on her left lower extremity about 10 years ago where the possible veins stripping was done. She has had an opinion from interventional radiology around November 2016 where no further sclerotherapy was ordered. The patient works 12 hours a day and stands on a concrete floor with work boots and is unable to get the proper compression she requires and cannot elevate her limbs appropriately at any given time. She has recently grown Pseudomonas from her wound culture but has not started her ciprofloxacin which was called in for her. 09/13/15 this continues to be a difficult situation for this patient. At one point I had this wound down to a 1.5 x 1.5" wound on her left leg. This is deteriorated and the right leg has reopened. She now has substantial wounds on her medial calcaneus, malleoli and into her lower leg. One on the left has surface eschar but these are far too painful for me to debridement here. She has a vascular surgery appointment next week to see if anything can be done to help here. I think she has had previous ablations several years ago at [facility]. She has no major edema. She tells me that she did not get product last time [facility]] and went for several days without it. She continues to work in work boots 12 hours a day. She cannot get compression/4-layer under her work boots. 09/20/15 no major change. Periwound edema control was not very good. Her point with  pain and vascular is next Wednesday the 25th 09/28/15; the patient is seen vascular surgery and is apparently scheduled for repeat duplex ultrasounds of her bilateral lower legs next week. 10/05/15; the patient was seen by Dr. Doren Custard of vascular surgery. He feels that she should have arterial insufficiency excluded as cause/contributed to her nonhealing stage she is therefore booked for an arteriogram. She has apparently monophasic signals in the dorsalis pedis pulses. She also of course has known severe chronic venous insufficiency  with previous procedures as noted previously. I had another long discussion with the patient today about her continuing to work 12 hour shifts. I've written her out for 2 months area had concerns about this as her work location is currently undergoing significant turmoil and this may lead to her termination. She is aware of this however I agree with her that she simply cannot continue to stand for 12 hours multiple days a week with the substantial wound areas she has. 10/19/15; the Dr. Doren Custard appointment was largely for an arteriogram which was normal. She does not have an arterial issue. He didn't make a comment about her chronic venous insufficiency for which she has had previous ablations. Presumably it was not felt that anything additional could be done. The patient is now out of work as I prescribed 2 weeks ago. Her wounds look somewhat less aggravated presumably because of this. I felt I would give debridement another try today 10/25/15; no major change in this patient's wounds. We are struggling to get her product that she can afford into her own home through her insurance. 11/01/15; no major change in the patient's wounds. I have been using silver alginate as the most affordable product. I spoke to Dr. Marla Roe last week with her requested take her to the OR for surgical debridement and placement of ACEL. Dr. Marla Roe told me that she would be willing to do this however [facility] will not cover this, fortunately the patient has [place] healthcare of some variant 11/08/15; no major change in the patient's wounds. She has been completely nonviable surface that this but is in too much pain with any attempted debridement are clinic. I have arranged for her to see Dr. Marla Roe ham of plastic surgery and this appointment is on Monday. I am hopeful that they will take her to the OR for debridement, possible ACEL ultimately possible skin graft 11/22/15 no major change in the patient's wounds  over her bilateral medial calcaneus medial malleolus into the lower legs. Surface on these does not look too bad however on the left there is surrounding erythema and tenderness. This may be cellulitis or could him sleepy tinea. 11/29/15; no major changes in the patient's wounds over her bilateral medial malleolus. There is no infection here and I don't think any additional antibiotics are necessary. There is now plan to move forward. She sees Dr. Marla Roe in a week's time for preparation for operative debridement and ACEL placement I believe on 7/12. She then has a follow-up appointment with Dr. Marla Roe on 7/21 12/28/15; the patient returns today having been taken to the [facility] by Dr. Marla Roe 12/12/15 she underwent debridement, intraoperative cultures [which were negative]. She had placement of a wound VAC. Parent really ACEL was not available to be placed. The wound VAC foam apparently adhered to the wound since then she's been using silver alginate, Xeroform under Ace wraps. She still says there is a lot of drainage and a lot of pain 01/31/16; this is a patient I  see monthly. I had referred her to Dr. Marla Roe him of plastic surgery for large wounds on her bilateral medial ankles. She has been to the OR twice once in early July and once in early August. She tells me over the last 3 weeks she has been using the wound VAC with ACEL underneath it. On the right we've simply been using silver alginate. Under Kerlix Coban wraps. 02/28/16; this is a patient I'm currently seeing monthly. She is gone on to have a skin graft over her large venous insufficiency ulcer on the left medial ankle. This was done by Dr. Marla Roe him. The patient is a bit perturbed about why she didn't have one on her right medial ankle wound. She has been using silver alginate to this. 03/06/16; I received a phone call from her plastic surgery Dr. Marla Roe. She expressed some concern about the viability of the skin graft she did  on the left medial ankle wound. Asked me to place Endoform on this. She told me she is not planning to do a subsequent skin graft on the right as the left one did not take very well. I had placed Hydrofera Blue on the right 03/13/16; continue to have a reasonably healthy wound on the right medial ankle. Down to 3 mm in terms of size. There is epithelialization here. The area on the left medial ankle is her skin graft site. I suppose the last week this looks somewhat better. She has an open area inferiorly however in the center there appears to be some viable tissue. There is a lot of surface callus and eschar that will eventually need to come off however none of this looked to be infected. Patient states that the is able to keep the dressing on for several days which is an improvement. 03/20/16 no major change in the circumference of either wound however on the left side the patient was at Dr. Eusebio Friendly office and they did a debridement of left wound. 50% of the wound seems to be epithelialized. I been using Endoform on the left Hydrofera Blue in the right 03/27/16; she arrives today with her wound is not looking as healthy as they did last week. The area on the right clearly has an adherent surface to this a very similar surface on the left. Unfortunately for this patient this is all too familiar problem. Clearly the Endoform is not working and will need to change that today that has some potential to help this surface. She does not tolerate debridement in this clinic very well. She is changing the dressing wants 04/03/16; patient arrives with the wounds looking somewhat better especially on the right. Dr. Migdalia Dk change the dressing to silver alginate when she saw her on Monday and also sold her some compression socks. The usefulness of the latter is really not clear and woman with severely draining wounds. 04/10/16; the patient is doing a bit of an experiment wearing the compression stockings that  Dr. Migdalia Dk provided her to her left leg and the out of legs based dressings that we provided to the right. 05/01/16; the patient is continuing to wear compression stockings Dr. Migdalia Dk provided her on the left that are apparently silver impregnated. She has been using Iodoflex to the right leg wound. Still a moderate amount of drainage, when she leaves here the wraps only last for 4 days. She has to change the stocking on the left leg every night 05/15/16; she is now using compression stockings bilaterally provided by Dr. Marla Roe. She  is wearing a nonadherent layer over the wounds so really I don't think there is anything specific being done to this now. She has some reduction on the left wound. The right is stable. I think all healing here is being done without a specific dressing 06/09/16; patient arrives here today with not much change in the wound certainly in diameter to large circular wounds over the medial aspect of her ankle bilaterally. Under the light of these services are certainly not viable for healing. There is no evidence of surrounding infection. She is wearing compression stockings with some sort of silver impregnation as prescribed by Dr. Marla Roe. She has a follow-up with her tomorrow. 06/30/16; no major change in the size or condition of her wounds. These are still probably covered with a nonviable surface. She is using only her purchase stockings. She did see Dr. Marla Roe who seemed to want to apply Dakin's solution to this I'm not extreme short what value this would be. I would suggest Iodoflex which she still has at home. 07/28/16; I follow Mrs. Eisman episodically along with Dr. Marla Roe. 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 She has been applying a topical collagen ointment to both wounds with Adaptic. I don't think Dr. Marla Roe is planning to take her back to the OR. 08/19/16; I follow Mrs. Jeneen Rinks on a  monthly basis along with Dr. Marla Roe of plastic surgery. 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 I been following her for a number of years. At one point I was able to get the right medial malleolus wound to heal and had the left medial malleolus down to about half its current size however and I had to send her to plastic surgery for an operative debridement. Since then things have been stable to slightly improve the area on the right is slightly better one in the left about the same although there is much less adherent surface than I'm used to with this patient. She is using some form of liquid collagen gel that Dr. Marla Roe provided a Kerlix cover with the patient's own pressure stockings. She tells me that she has extreme pain in both ankles and along the lateral aspect of both feet. She has been unable to work for some period of time. She is telling me she is retiring at the beginning of April. She sees Dr. Doran Durand of orthopedics next week 09/22/16; patient has not seen Dr. Marla Roe since the last time she is here. I'm not really sure what she is using to the wounds other than bits and pieces of think she had left over including most recently Hydrofera Blue. She is using juxtalite stockings. She is having difficulty with her husband's recent illness "stroke". She is having to transport him to various doctors appointments. Dr. Marla Roe left her the option of a repeat debridement with ACEL however she has not been able to get the time to follow-up on this. She continues to have a fair amount of drainage out of these wounds with certainly precludes leaving dressings on all week 10/13/16; patient has not seen Dr. Marla Roe since she was last in our clinic. I'm not really sure what she is doing with the wounds, we did try to get her [facility] and I think she is actually using this most of the time. Because of drainage she  states she has to change this every second day although this is  an improvement from what she used to do. She went to see Dr. Doran Durand who did not think she had a muscular issue with regards to her feet, he referred her to a neurologist and I think the appointment is sometime in June. I changed her back to Iodoflex which she has used in the past but not recently. 11/03/16; the patient has been using Iodoflex although she ran out of this. Still claims that there is a lot of drainage although the wound does not look like this. No surrounding erythema. She has not been back to see Dr. Marla Roe 11/24/16; the patient has been using Iodoflex again but she ran out of it 2 or 3 days ago. There is no major change in the condition of either one of these wounds in fact they are larger and covered in a thick adherent surface slough/nonviable tissue especially on the left. She does not tolerate mechanical debridement in our clinic. Going back to see Dr. Marla Roe of plastic surgery for an operative debridement would seem reasonable. 12/15/16; the patient has not been back to see Dr. Marla Roe. She is been dealing with a series of illnesses and her husband which of monopolized her time. She is been using Sorbact which we largely supplied. She states the drainage is bad enough that it maximum she can go 2-3 days without changing the dressing 01/12/2017 -- the patient has not been back for about 4 weeks and has not seen Dr. Marla Roe not does she have any appointment pending. 01/23/17; patient has not seen Dr. Marla Roe even though I suggested this previously. She is using Santyl that was suggested last week by Dr. Con Memos this Cost her $16 through her insurance which is indeed surprising 02/12/17; continuing Santyl and the patient is changing this daily. A lot of drainage. She has not been back to see plastic surgery she is using an Ace wrap. Our intake nurse suggested wrap around stockings which would make a good  reasonable alternative 02/26/17; patient is been using Santyl and changing this daily due to drainage. She has not been to see plastic surgery she uses in April Ace wrap to control the edema. She did obtain extremitease stockings but stated that the edema in her leg was to big for these 03/20/17; patient is using Santyl and Anasept. Surfaces looked better today the area on the right is actually measuring a little smaller. She has states she has a lot of pain in her feet and ankles and is asking for a consult to pain control which I'll try to help her with through our case manager. 04/10/17; the patient arrives with better-looking wound surfaces and is slightly smaller wound on the left she is using a combination of Santyl and Anasept. She has an appointment or at least as started in the pain control center associated with [facility] 05/14/17; this is a patient who I followed for a prolonged period of time. She has venous insufficiency ulcers on her bilateral medial ankles. At one point I had this down to a much smaller wound on the left however these reopened and we've never been able to get these to heal. She has been using Santyl and Anasept gel although 2 weeks ago she ran out of the Anasept gel. She has a stable appearance of the wound. She is going to the wound care clinic at [facility]. They wanted do a nerve block/spinal block although she tells me she is reluctant to go forward with that. 05/21/17; this is a patient  I have followed for many years. She has venous insufficiency ulcers on her bilateral medial ankles. Chronic pain and deformity in her ankles as well. She is been to see plastic surgery as well as orthopedics. Using PolyMem AG most recently/Kerramax/ABDs and 2 layer compression. She has managed to keep this on and she is coming in for a nurse check to change the dressing on Tuesdays, we see her on Fridays 06/05/17; really quite a good looking surface and the area especially  on the right medial has contracted in terms of dimensions. Well granulated healthy-looking tissue on both sides. Even with an open curet there is nothing that even feels abnormal here. This is as good as I've seen this in quite some time. We have been using PolyMem AG and bringing her in for a nurse check 06/12/17; really quite good surface on both of these wounds. The right medial has contracted a bit left is not. We've been using PolyMem and AG and she is coming in for a nurse visit 06/19/17; we have been using PolyMem AG and bringing her in for a nurse check. Dimensions of her wounds are not better but the surfaces looked better bilaterally. She complained of bleeding last night and the left wound and increasing pain bilaterally. She states her wound pain is more neuropathic than just the wounds. There was some suggestion that this was radicular from her pain management doctor in talking to her it is really difficult to sort this out. 06/26/17; using PolyMem and AG and bringing her in for a nurse check as All of this and reasonably stable condition. Certainly not improved. The dimensions on the lateral part of the right leg look better but not really measuring better. The medial aspect on the left is about the same. 07/03/16; we have been using PolyMen AG and bringing her in for a nurse check to change the dressings as the wounds have drainage which precludes once weekly changing. We are using all secondary absorptive dressings.our intake nurse is brought up the idea of using a wound VAC/snap VAC on the wound to help with the drainage to see if this would result in some contraction. This is not a bad idea. The area on the right medial is actually looking smaller. Both wounds have a reasonable-looking surface. There is no evidence of cellulitis. The edema is well controlled 07/10/17; the patient was denied for a snap VAC by her insurance. The major issue with these wounds continues to be drainage. We are  using wicked PolyMem AG and she is coming in for a nurse visit to change this. The wounds are stable to slightly improved. The surface looks vibrant and the area on the right certainly has shrunk in size but very slowly 07/17/17; the patient still has large wounds on her bilateral medial malleoli. Surface of both of these wounds looks better. The dimensions seem to come and go but no consistent improvement. There is no epithelialization. We do not have options for advanced treatment products due to insurance issues. They did not approve of the wound VAC to help control the drainage. More recently we've been using PolyMem and AG wicked to allow drainage through. We have been bringing her in for a nurse visit to change this. We do not have a lot of options for wound care products and the home again due to insurance issues 07/24/17; the patient's wound actually looks somewhat better today. No drainage measurements are smaller still healthy-looking surface. We used silver collagen under PolyMen  started last week. We have been bringing her in for a dressing change 07/31/17; patient's wound surface continued to look better and I think there is visible change in the dimensions of the wound on the right. Rims of epithelialization. We have been using silver collagen under PolyMen and bringing her in for a dressing change. There appears to be less drainage although she is still in need of the dressing change 08/07/17. Patient's wound surface continues to look better on both sides and the area on the right is definitely smaller. We have been using silver collagen and PolyMen. She feels that the drainage has been it has been better. I asked her about her vascular status. She went to see Dr. Aleda Grana at [facility] and had some form of ablation. I don't have much detail on this. I haven't my notes from 2016 that she was not a candidate for any further ablation but I don't have any more information on this. We had  referred her to vein and vascular I don't think she ever went. He does not have a history of PAD although I don't have any information on this either. We don't even have ABIs in our record 08/14/17; we've been using silver collagen and PolyMen cover. And putting the patient and compression. She we are bringing her in as a nurse visit to change this because ofarge amount of drainage. We didn't the ABIs in clinic today since they had been done in many moons 1.2 bilaterally. She has been to see vein and vascular however this was at [facility] and she had ablation although I really don't have any information on this all seemed biking get a report. She is also been operatively debrided by plastic surgery and had a cell placed probably 8-12 months ago. This didn't have a major effect. We've been making some gains with current dressings 08/19/17-She is here in follow-up evaluation for bilateral medial malleoli ulcers. She continues to tolerate debridement very poorly. We will continue with recently changed topical treatment; if no significant improvement may consider switching to Iodosorb/Iodoflex. 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 She has been using silver collagen and PolyMem. I believe she has been used and tried on Iodoflex before. 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 These have been very refractory over time. We have been using silver collagen and PolyMen. She is coming in once a week for a doctor's and once a week for nurse visits. We are actually making some progress 09/18/17; the patient's wounds are smaller especially on the right medial. She arrives today to upset to consider even washing these off with Anasept which I think is been part of the  reason this is been closing. We've been using collagen covered in PolyMen otherwise. It is noted that she has a small area of folliculitis on the right medial calf that. As we are wrapping her legs I'll give her a short course of doxycycline to make sure this doesn't amount to anything. She is a long list of complaints today including imbalance, shortness of breath on exertion, inversion of her left ankle. With regards to the latter complaints she is been to see orthopedics and they offered her a tendon release surgery I believe but wanted her wounds to be closed first. I  have recommended she go see her primary physician with regards to everything else. 09/25/17; patient's wounds are about the same size. We have made some progress bilaterally although not in recent weeks. She will not allow me T wash these wounds with Anasept even if she is doing her cell. Wheeze we've been using collagen covered in PolyMen. Last week she had a small area of folliculitis this is now opened into a small wound. She completed 5 days of trimethoprim sulfamethoxazole 10/02/17; unfortunately the area on her left medial ankle is worse with a larger wound area towards the Achilles. The patient complains of a lot of pain. She will not allow debridement although visually I don't think there is anything to debridement in any case. We have been using silver collagen and PolyMen for several months now. Initially we are making some progress although I'm not really seeing that today. We will move back to [facility]. His admittedly this is a bit of a repeat however I'm hoping that his situation is different now. The patient tells me she had her leg on the left give out on her yesterday this is process some pain. 10/09/17; the patient is seen twice a week largely because of drainage issues coming out of the chronic medial bimalleolar wounds that are chronic. Last week the dimensions of the one on the left looks a little larger I  changed her to [facility]. She comes in today with a history of terrible pain in the bilateral wound areas. She will not allow debridement. She will not even allow a tissue culture. There is no surrounding erythema no no evidence of cellulitis. We have been putting her Kerlix Coban man. She will not allow more aggressive compression as there was a suggestion to put her in 3 layer wraps. 10/16/17; large wounds on her bilateral medial malleoli. These are chronic. Not much change from last week. The surface looks have healthy but absolutely no epithelialization. A lot of pain little less so of drainage. She will not allow debridement or even washing these off in the vigorous fashion with Anasept. 10/23/17; large wounds on her bilateral malleoli which are chronic. Some improvement in terms of size perhaps on the right since last time I saw these. She states that after we increased the 3 layer compression there was some bleeding, when she came in for a nurse visit she did not want 3 layer compression put back on about our nurse managed to convince her. She has known chronic venous visit issues and I'm hoping to get her to tolerate the 3 layer compression. using Hydrofera Blue 10/30/17; absolutely no change in the condition of either wound although we've had some improvement in dimensions on the right.. Attempted to put her in 3 layer compression she didn't tolerated she is back in 2 layer compression. We've been using Hydrofera Blue We looked over her past records. She had venous reflux studies in November 2016. There was no evidence of deep venous reflux on the right. Superficial vein did not show the greater saphenous vein at think this is been previously ablated the small saphenous vein was within normal limits. The left deep venous system showed no DVT the vessels were positive for deep venous reflux in the posterior tibial veins at the ankle. The greater saphenous vein was surgically absent small  saphenous vein was within normal limits. She went to vein and vascular at [facility]. I believe she had an ablation on the left greater saphenous vein. I'll update  her reflux studies perhaps ever reviewed by vein and vascular. We've made absolutely no progress in these wounds. Will also try to read and TheraSkins through her insurance 11/06/17; W the patient apparently has a 2 week follow-up with vein and vascular I like him to review the whole issue with regards to her previous vascular workup by Dr. Aleda Grana. We've really made no progress on these wounds in many months. She arrives today with less viable looking surface on the left medial ankle wound. This was apparently looking about the same on Tuesday when she was here for nurse visit. 11/13/17; deep tissue culture I did last time of the left lower leg showed multiple organisms without any predominating. In particular no Staphylococcus or group A strep were isolated. We sent her for venous reflux studies. She's had a previous left greater saphenous vein stripping and I think sclerotherapy of the right greater saphenous vein. She didn't really look at the lesser saphenous vein this both wounds are on the medial aspect. She has reflux in the common femoral vein and popliteal vein and an accessory vein on the right and the common femoral vein and popliteal vein on the left. I'm going to have her go to see vein and vascular just the look over things and see if anything else beside aggressive compression is indicated here. We have not been able to make any progress on these wounds in spite of the fact that the surface of the wounds is never look too bad. 11/20/17; no major change in the condition of the wounds. Patient reports a large amount of drainage. She has a lot of complaints of pain although enlisting her today I wonder if some of this at least his neuropathic rather than secondary to her wounds. She has an appointment with vein and vascular  on 12/30/17. The refractory nature of these wounds in my mind at least need vein and vascular to look over the wounds the recent reflux studies we did and her history to see if anything further can be done here. I also note her gait is deteriorated quite a bit. 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 She has a bilateral Trendelenburg gait. I wonder if this is neuropathic or perhaps multilevel radicular. 11/27/17; her wounds actually looks slightly better. Healthy-looking granulation tissue a scant amount of epithelialization. [place] healthcare will not pay for [employer]. They will play for tri layer Oasis and Dermagraft. This is not a diabetic ulcer. We'll try for the tri layer Oasis. She still complains of some drainage. She has a vein and vascular appointment on 12/30/17 12/04/17; the wounds visually look quite good. Healthy-looking granulation with some degree of epithelialization. We are still waiting for response to our request for trial to try layer Oasis. Her appointment with vascular to review venous and arterial issues isn't sold the end of July 7/31. Not allow debridement or even vigorous cleansing of the wound surface. 12/18/17; slightly smaller especially on the right. 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 We've been using Hydrofera Blue. We still are looking into triple layer Oasis through her insurance 01/08/18 on evaluation today patient's wound actually appears to be showing signs of good improvement at this point in time. She has been tolerating the dressing changes without complication. Fortunately there does not appear to be any evidence of infection at this point in time. We have been utilizing silver nitrate which does seem to be of benefit for her which is also good news.  Overall I'm very happy with how things seem to be both regards appearance as well as measurement. Patient did see Dr. Bridgett Larsson for evaluation on 12/30/17. In his assessment he  felt that stripping would not likely add much more than chronic compression to the patient's healing process. His recommendation was to follow-up in three months with Dr. Doren Custard if she hasn't healed in order to consider referral back to you and see vascular where she previously was in a trial and was able to get her wound to heal. I'll be see what she feels she when you staying compression and he reiterated this as well. 01/13/18 on evaluation today patient appears to actually be doing very well in regard to her bilateral medial malleolus ulcers. She seems to have tolerated the chemical cauterization with silver nitrate last week she did have some pain through that evening but fortunately states that I'll be see since it seems to be doing better she is overall pleased with the progress. 01/21/18; really quite a remarkable improvement since I've last seen these wounds. We started using silver nitrate specially on the islands of hyper granulation which for some reason her around the wound circumference. This is really done quite nicely. Primary dressing Hydrofera Blue under 4 layer compression. She seems to be able to hold out without a nurse rewrap. Follow-up in 1 week 01/28/18; we've continued the hydrofera blue but continued with chemical cauterization to the wound area that we started about a month ago for irregular hyper granulation. She is made almost stunning improvement in the overall wound dimensions. I was not really expecting this degree of improvement in these chronic wounds 02/05/18; we continue with Hydrofera Bluebut of also continued the aggressive chemical cauterization with silver nitrate. We made nice progress with the right greater than left wound. 02/12/18. We continued with Hydrofera Blue after aggressive chemical cauterization with silver nitrate. We appear to be making nice progress with both wound areas 02/19/2018; we continue with [facility] after washing the wounds vigorously  with Anasept spray and chemical cauterization with silver nitrate. We are making excellent progress. The area on the right's just about closed 02/26/2018. The area on the left medial ankle had too much necrotic debris today. I used a #5 curette we are able to get most of the soft. I continued with the silver nitrate to the much smaller wound on the right medial ankle she had a new area on her right lower pretibial area which she says was due to a role in her compression 03/05/2018; both wound areas look healthy. Not much change in dimensions from last week. I continue to use silver nitrate and Hydrofera Blue. The patient saw Dr. Doren Custard of vein and vascular. He felt she had venous stasis ulcers. He felt based on her previous arteriogram she should have adequate circulation for healing. Also she has deep venous reflux but really no significant correctable superficial venous reflux at this time. He felt we should continue with conservative management including leg elevation and compression 04/02/2018; since we last saw this woman about a month ago she had a fall apparently suffered a pelvic fracture. I did not look up the x-ray. Nevertheless because of pain she literally was bedbound for 2 weeks and had home health coming out to change the dressing. Somewhat predictably this is resulted in considerable improvement in both wound areas. The right is just about closed on the medial malleolus and the left is about half the size. 04/16/2018; both her wounds continue to  go down in size. Using Hydrofera Blue. 05/07/18; both her wounds appeared to be improving especially on the right where it is almost closed. We are using Hydrofera Blue 05/14/2018; slightly worse this week with larger wounds. Surface on the left medial not quite as good. We have been using Hydrofera Blue 05/21/18; again the wounds are slightly larger. Left medial malleolus slightly larger with eschar around the circumference. We have been using  Hydrofera Blue undergoing a wraps for a prolonged period of time. This got a lot better when she was more recumbent due to a fall and a back injury. I change the primary dressing the silver alginate today. She did not tolerate a 4 layer compression previously although I may need to bring this up with her next time 05/28/2018; area on the left medial malleolus again is slightly larger with more drainage. Area on the right is roughly unchanged. She has a small area of folliculitis on the right medial just on the lower calf. This does not look ominous. 06/03/2018 left medial malleolus slightly smaller in a better looking surface. We used silver nitrate on this last time with silver alginate. The area on the right appears slightly smaller 1/10; left medial malleolus slightly smaller. Small open area on the right. We used silver nitrate and silver alginate as of 2 weeks ago. We continue with the wound and compression. These got a lot better when she was off her feet 1/17; right medial malleolus wound is smaller. The left may be slightly smaller. Both surfaces look somewhat better. 1/24; both wounds are slightly smaller. Using silver alginate under Unna boots 1/31; both wounds appear smaller in fact the area on the right medial is just about closed. Surface eschar. We have been using silver alginate under Unna boots. The patient is less active now spends let much less time on her feet and I think this is contributed to the general improvement in the wound condition 2/7; both wounds appear smaller. I was hopeful the right medial would be closed however there there is still the same small open area. Slight amount of surface eschar on the left the dimensions are smaller there is eschar but the wound edges appear to be free. We have been using silver alginate under Unna boot's 2/14; both wounds once again measure smaller. Circumferential eschar on the left medial. We have been using silver alginate under Unna  boots with gradual improvement 2/21; the area on the right medial malleolus has healed. The area on the left is smaller. We have been using silver alginate and Unna boots. We can discharge wrapping the right leg she has 20/30 stockings at home she will need to protect the scar tissue in this area 2/28; the area on the right medial malleolus remains closed the patient has a compression stocking. The area on the left is smaller. We have been using silver alginate and Unna boots. 3/6 the area on the right medial ankle remains closed. Good edema control noted she is using her own compression stocking. The area on the left medial ankle is smaller. We have been managing this with silver alginate and Unna boots which we will continue today. 3/13; the area on the right medial ankle remains closed and I'm declaring it healed today. When necessary the left is about the same still a healthy-looking surface but no major change and wound area. No evidence of infection and using silver alginate under unna and generally making considerable improvement 3/27 the area on the right medial  ankle remains closed the area on the left is about the same as last week. Certainly not any worse we have been using silver alginate under an Unna boot 4/3; the area on the right medial ankle remains closed per the patient. We did not look at this wound. The wound on the left medial ankle is about the same surface looks healthy we have been using silver alginate under an Unna boot 4/10; area on the right medial ankle remains closed per the patient. We did not look at this wound. The wound on the left medial ankle is slightly larger. The patient complains that the [facility] caused burning pain all week. She also told us that she was a lot more active this week. Changed her back to silver alginate 4/17; right medial ankle still closed per the patient. Left medial ankle is slightly larger. Using silver alginate. She did not  tolerate Hydrofera Blue on this area 4/24; right medial ankle remains closed we have not look at this. The left medial ankle continues to get larger today by about a centimeter. We have been using silver alginate under Unna boots. She complains about 4 layer compression as an alternative. She has been up on her feet working on her garden 5/8; right medial ankle remains closed we did not look at this. The left medial ankle has increased in size about 100%. We have been using silver alginate under Unna boots. She noted increased pain this week and was not surprised that the wound is deteriorated 5/15; no major change in SA however much less erythema ( one week of doxy ocellulitis). 5/22-65 year old female returns at 1 week to the clinic for left medial ankle wound for which we have been using silver alginate under 3 layer compression She was placed on DOXY at last visit - the wound is wider at this visit. She is in 3 layer compression 5/29; change to [facility] last week. I had given her empiric doxycycline 2 weeks ago for a week. She is in 3 layer compression. She complains of a lot of pain and drainage on presentation today. 6/5; using Hydrofera Blue. I gave her doxycycline recently empirically for erythema and pain around the wound. Believe her cultures showed enterococcus which not would not have been well covered by doxycycline nevertheless the wound looks better and I don't feel specifically that the enterococcus needs to be covered. She has a new what looks like a wrap injury on her lateral left ankle. 6/12; she is using Hydrofera Blue. She has a new area on the left anterior lower tibial area. This was a wrap injury last week. 6/19; the patient is using Hydrofera Blue. She arrived with marked inflammation and erythema around the wound and tenderness. 12/01/18 on evaluation today patient appears to be doing a little bit better based on what I'm hearing from the standpoint of lassos evaluation  to this as far as the overall appearance of the wound is concerned. Then sometime substandard she typically sees Dr. Dellia Nims. Nonetheless overall very pleased with the progress that she's made up to this point. No fevers, chills, nausea, or vomiting noted at this time. 7/10; some improvement in the surface area. Aggressively debrided last week apparently. I went ahead with the debridement today although the patient does not tolerate this very well. We have been using Iodoflex. Still a fair amount of drainage 7/17; slightly smaller. Using Iodoflex. 7/24; no change from last week in terms of surface area. We have been using Iodoflex.  Surface looks and continues to look somewhat better 7/31; surface area slightly smaller better looking surface. We have been using Iodoflex. This is under Unna boot compression 8/7-Patient presents at 1 week with Unna boot and Iodoflex, wound appears better 8/14-Patient presents at 1 week with Iodoflex, we use the Unna boot, wound appears to be stable better.Patient is getting Botox treatment for the inversion of the foot for tendon release, Next week 8/21; we are using Iodoflex. Unna boot. The wound is stable in terms of surface area. Under illumination there is some areas of the wound that appear to be either epithelialized or perhaps this is adherent slough at this point I was not really clear. It did not wipe off and I was reluctant to debride this today. 8/28; we are using Iodoflex in an Unna boot. Seems to be making good improvement. 9/4; using Iodoflex and wound is slightly smaller. 9/18; we are using Iodoflex with topical silver nitrate when she is here. The wound continues to be smaller 10/2; patient missed her appointment last week due to GI issues. She left and Iodoflex based dressing on for 2 weeks. Wound is about the same size about the size of a dime on the left medial lower 10/9 we have been using Iodoflex on the medial left ankle wound. She has a new  superficial probable wrap injury on the dorsal left ankle 10/16; we have been using Hydrofera Blue since last week. This is on the left medial ankle 10/23; we have been using Hydrofera Blue since 2 weeks ago. This is on the left medial ankle. Dimensions are better 11/6; using Hydrofera Blue. I think the wound is smaller but still not closed. Left medial ankle 11/13; we have been using Hydrofera Blue. Wound is certainly no smaller this week. Also the surface not as good. This is the remanent of a very large area on her left medial ankle. 11/20; using Sorbact since last week. Wound was about the same in terms of size although I was disappointed about the surface debris 12/11; 3-week follow-up. Patient was on vacation. Wound is measuring slightly larger we have been using Sorbact. 12/18; wound is about the same size however surface looks better last week after debridement. We have been using Sorbact under compression 1/15 wound is probably twice the size of last time increased in length nonviable surface. We have been using Sorbact. She was running a mild fever and missed her appointment last week 1/22; the wound is come down in size but under illumination still a very adherent debris we have been Hydrofera Blue that I changed her to last week 1/29; dimensions down slightly. We have been using Hydrofera Blue 2/19 dimensions are the same however there is rims of epithelialization under illumination. Therefore more the surface area may be epithelialized 2/26; the patient's wound actually measures smaller. The wound looks healthy. We have been using Hydrofera Blue. I had some thoughts about running Apligraf then I still may do that however this looks so much better this week we will delay that for now 3/5; the wound is small but about the same as last week. We have been using Hydrofera Blue. No debridement is required today. 3/19; the wound is about the size of a dime. Healthy looking wound even under  illumination. We have been using Hydrofera Blue. No mechanical debridement is necessary 3/26; not much change from last week although still looks very healthy. We have been using Hydrofera Blue under Unna boots Patient was offered an ankle  fusion by podiatry but not until the wound heals with a proceed with this. 4/9; the patient comes in today with her original wound on the medial ankle looking satisfactory however she has some uncontrolled swelling in the middle part of her leg with 2 new open areas superiorly just lateral to the tibia. I think this was probably a wrap issue. She said she felt uncomfortable during the week but did not call in. We have been using Hydrofera Blue 4/16; the wound on the medial ankle is about the same. She has innumerable small areas superior to this across her mid tibia. I think this is probably folliculitis. She is also been working in the yard doing a lot of sweating 4/30; the patient issue on the upper areas across her mid tibia of all healed. I think this was excessive yard work if I remember. Her wound on the medial ankle is smaller. Some debris on this we have been using Hydrofera Blue under Unna boots 5/7; mid tibia. She has been using Hydrofera Blue under an Unna wrap. She is apparently going for her ankle surgery on June 3 10/28/19-Patient returns to clinic with the ankle wound, we are using Hydrofera Blue under Unna wrap, surgery is scheduled for her left foot for June 3 so she will be back for nurse visit next week READMISSION 01/17/2020 Mrs. Bachicha is a 65 year old woman we have had in this clinic for a long period of time with severe venous hypertension and refractory wounds on her medial lower legs and ankles bilaterally. 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 When she was off her legs for a prolonged period example when she fell  and suffered a compression fracture things would heal up quite nicely. She is now retired and we managed to heal up the right medial leg wound. The left one was very tiny last time I saw this although still refractory. 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 It got to the point where this was interfering with her walking and she elected to proceed with a ankle arthrodesis to straighten her her ankle and leave her with a functional outcome for mobilization. The patient was referred to Dr. Doren Custard and really this took some time to arrange. Dr. Doren Custard saw her on 12/07/2019. Once again he verified that she had no arterial issues. She had previously had an angiogram several years ago. Follow-up ABIs on the left showed an ABI of 1.12 with triphasic waveforms and a TBI of 0.92. She is felt to have chronic deep venous insufficiency but I do not think it was felt that anything could be done from about this from an ablation point of view. At the time Dr. Doren Custard saw this patient the wounds actually look closed via the pictures in his clinic. The patient finally underwent her surgery on 12/15/2019. This went reasonably well and there was a good anatomic outcome. She developed a small distal wound dehiscence on the lateral part of the surgical wound. However more problematically she is developed recurrence of the wound on the medial left ankle. There are actually 2 wounds here one in the distal lower leg and 1 pretty much at the level of the medial malleolus. It is a more distal area that is more problematic. She has been using  Hydrofera Blue which started on Friday before this she was simply Ace wrapping. There was a culture done that showed Pseudomonas and she is on ciprofloxacin. A recent CNS on 8/11 was negative. The patient reports some pain but I generally think this is improving. She is using a cam boot completely  nonweightbearing using a walker for pivot transfers and a wheelchair 8/24; not much improvement unfortunately she has a surgical wound on the lateral part in the venous insufficiency wound medially. The bottom part of the medial insufficiency wound is still necrotic there is exposed tendon here. We have been using Hydrofera Blue under compression. Her edema control is however better 8/31; patient in for follow-up of his surgical wound on the lateral part of her left leg and chronic venous insufficiency ulcers medially. We put her back in compression last week. She comes in today with a complaint of 3 or 4 days worth of increasing pain. She felt her cam walker was rubbing on the area on the back of her heel. However there is intense erythema seems more likely she has cellulitis. She had 2 cultures done when she was seeing podiatry in the postop. One of them in late July showed Pseudomonas and she received a course of ciprofloxacin the other was negative on 8/11 she is allergic to penicillin with anaphylactoid complaints of hives oral swelling via information in epic 9/9; when I saw this patient last week she had intense anterior erythema around her wound on the right lateral heel and ankle and also into the right medial heel. Some of this was no doubt drainage and her walker boot however I was convinced she had cellulitis. I gave her Levaquin and Bactrim she is finishing up on this now. She is following up with Dr. [name] he saw her yesterday. He is taken her out of the walking boot of course she is still nonweightbearing. Her x-ray was negative for any worrisome features such as soft tissue air etc. Things are a lot better this week. She has home health. We have been using Hydrofera Blue under an [employer] which she put back on yesterday. I did not wrap her last week 9/17; her surrounding skin looks a lot better. In fact the area on the left lateral ankle has just a scant amount of eschar. The only  remaining wound is the large area on the left medial ankle. Probably about 60% of this is healthy granulation at the surface however she has a significant divot distally. This has adherent debris in it. I been using debridement and silver collagen to try and get this area to fill-in although I do not think we have made much progress this week 9/24; the patient's wound on the left medial ankle looks a lot better. The deeper divot area distally still requires debridement but this is cleaning up quite nicely we have been using silver collagen. The patient is complaining of swelling in her foot and is worried that that is contributing to the nonhealing of the ankle wound. She is also complaining of numbness in her anterior toes 10/4; left medial ankle. The small area distally still has a divot with necrotic material that I have been debriding away. This has an undermining area. She is approved for Apligraf. She saw Dr. [name] her surgeon on 10/1. I think he declared himself is satisfied with the condition of things. Still nonweightbearing till the end of the month. 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 Her  surgical wound is well closed. There is no evidence of infection 10/11; the patient arrived in clinic today with the expectation that we be able to put an Apligraf on this area after debridement however she arrives with a relatively large amount of green drainage on the dressing. The patient states that this started on Friday. She has not been systemically unwell. 10/19; culture I did last week showed both Enterococcus and Pseudomonas. I think this came in separate parts because I stopped her ciprofloxacin I gave her and prescribed her linezolid however now looking at the final culture result this is Pseudomonas which is resistant to quinolones. She has not yet picked up the linezolid apparently phone issues. We are also trying to get a topical antibiotic out of  [place] in [place] they can be applied by home health. She is still having green drainage 10/16; the patient has her topical antibiotic from [facility] in [place]. This is a compounded gel with vancomycin and ciprofloxacin and gentamicin. We are applying this on the wound bed with silver alginate over the top with Unna boot wraps. She arrives in clinic today with a lot less ominous looking drainage although she is only use this topical preparation once the second time today. She sees Dr. [name] her surgeon on Friday she has home health changing the dressing 11/2; still using her compounded topical antibiotic under silver alginate. Surface is cleaning up there is less drainage. We had an Apligraf for her today and I elected to apply it. A light coating of her antibiotic 04/25/2020 upon evaluation today patient appears to be doing well with regard to her ankle ulcer. There is a little bit of slough noted on the surface of the wound I am can have to perform sharp debridement to clear this away today. With that being said other than that fact overall I feel like she is making progress and we do see some new epithelial growth. There is also some improvement in the depth of the wound and that distal portion. There is little bit of slough there as well. 12/7; 2-week follow-up. Apligraf #3. Dimensions are smaller. Closing in especially inferiorly. Still some surface debris. Still using the [facility] topical antibiotic but I told her that I don't think this needs to be renewed 12/21; 2-week follow-up. Apligraf #4 dimensions are smaller. Nice improvement 06/05/2020; 2-week follow-up. The patient's wound on the left medial ankle looks really excellent. Nice granulation. Advancing epithelialization no undermining no evidence of infection. We would have to reapply for another Apligraf but with the condition of this wound I did not feel strongly about it. We used Hydrofera Blue under the same degree  of compression. She follows up with Dr. [name] her surgeon a week Friday 06/13/2020 upon evaluation today patient appears to be doing excellent in regard to her wound. She has been tolerating the dressing changes without complication. Fortunately there is no signs of active infection at this time. No fevers, chills, nausea, vomiting, or diarrhea. She was using Hydrofera Blue last week. 06/20/2020 06/20/2020 on evaluation today patient actually appears to be doing excellent in regard to her wound. This is measuring better and looking much better as well. She has been using the collagen that seems to be doing better for her as well even though the [facility] was and is not sticking or feeling as rough on her wound. She did see Dr. [name] on Friday he is very pleased he also stated none of the hardware has shifted. That  is great news 1/27; the patient has a small clean wound all that is remaining. I agree that this is too small to really consider an Apligraf. Under illumination the surface is looking quite good. We have been using collagen although the dimensions are not any better this week 2/2; the patient has a small clean wound on the left medial ankle. Although this left of her substantial original areas. Measurements are smaller. We have been using polymen Ag under an Haematologist. 2/10; small area on the left medial ankle. This looks clean nothing to debride however dimensions are about the same we have been using polymen I think now for 2 weeks 2/17; not much change in surface area. We have been using polymen Ag without any improvement. 3/17; 1 month follow-up. The patient has been using endoform without any improvement in fact I think this looks worse with more depth and more expansion 3/24; no improvement. Perhaps less debris on the surface. We have been using Sorbact for 1 week 4/4; wound measures larger. She has edema in her leg and her foot which she tells as her wrap came down. We have been  using Unna boots. Sorbact of the wound. She has been approved for Apligraf 09/12/2020 upon evaluation today patient appears to be doing well with regard to her wound. We did get the Apligraf reapproved which is great news we have that available for application today. Fortunately there is no signs of infection and overall the patient seems to be doing great. The wound bed is nice and clean. 4/27; patient presents for her second application of Apligraf. She states over the past week she has been on her feet more often due to being outside in her garden. She has noted more swelling to her foot as a result. She denies increased warmth, pain or erythema to the wound site. 10/10/2020 upon evaluation today patient appears to be doing well with regard to her wound which does not appear to be quite as irritated as last week from what I am hearing. With that being said unfortunately she is having issues with some erythema and warmth to touch as well as an increase size. I do believe this likely is infected. 10/17/2020 upon evaluation today patient appears to be doing excellent in regard to her wound this is significantly improved as compared to last week. Fortunately I think that the infection is much better controlled at this point. She did have evidence of both Enterococcus as well as Staphylococcus noted on culture. Enterococcus really would not be helped significantly by the Cipro but the wound is doing so much better I am under the assumption that the Staphylococcus is probably the main organism that is causing the current infection. Nonetheless I think that she is doing excellent as far as that is concerned and I am very pleased in that regard. I would therefore recommend she continue with the Cipro. 10/31/2020 upon evaluation today patient appears to be doing well with regard to her wound. She has been tolerating the dressing changes without complication. Fortunately there is no signs of active infection and  overall I am extremely pleased with where things stand today. No fevers, chills, nausea, vomiting, or diarrhea. With that being said she does have some green drainage coming from the wound and although it looks okay I am a little concerned about the possibility of a continuing infection. Specifically with Pseudomonas. For that reason I will go ahead and send in a prescription for Cipro for her to  be continued. 11/14/2020 upon evaluation today patient appears to be doing very well currently in regard to her wound on her leg. She has been tolerating the dressing changes without complication. Fortunately I feel like the infection is finally under good control here. Unfortunately we do not have the Apligraf for application today although we can definitely order to have it in place for next week. That will be her fifth and final of the current series. Nonetheless I feel like her wound is really doing quite well which is great news. 11/21/2020 upon evaluation today patient appears to be doing well with regard to her wound on the medial ankle. Fortunately I think the infection is under control and I do believe we can go ahead and reapply the Apligraf today. She is in agreement with that plan. There does not appear to be any signs of active infection at this time which is great news. No fevers, chills, nausea, vomiting, or diarrhea. 12/05/2020 upon evaluation today patient's wound bed actually showed signs of good granulation epithelization at this point. There does not appear to be any signs of infection which is great news and overall very pleased with where things stand. Overall the patient seems to be doing fairly well in my opinion with regard to her wound although I do believe she continues to build up a lot of biofilm I think she could benefit from using PuraPly at this point. 12/12/2020 upon evaluation today patient's wound actually appears to be doing decently well today. The Unna boot has not been quite as  well-tolerated so that more uncomfortable for her and even causing some pressure over the plate on the lateral portion of her foot which is 90 where the wound is. There did not appear to be any significant deep tissue injury with that there may be a minimal change in the skin noted I think that we may want to go back to the Coflex 2 layer which is a little bit easier on her skin it seems. 12/19/2020 upon evaluation today patient actually seems to be making great progress with the PuraPly currently. She in fact seems to be much better as far as the overall appearance of the wound bed is concerned I am very happy in this regard. I do not see any signs of of infection which is great news as well. No fevers, chills, nausea, vomiting, or diarrhea. 12/26/2020 upon evaluation today patient appears to actually be doing better in regard to her wound on the left medial ankle region. The surface of the wound is actually doing significantly better which is great news. There does not appear to be any signs of infection which is also great news and in general I am extremely pleased with where we stand today. 01/02/2021 upon evaluation today patient appears to be doing well with regard to her wound. In fact this is showing signs of excellent improvement and very pleased with where things stand. In fact the last 3 appointments have all shown signs of this getting smaller which is excellent news. I have not even had to perform any debridement and today is no exception. Overall I feel like this is dramatically improved compared to previous. T oday is PuraPly application #4. 5/40/9811 upon evaluation today patient appears to be doing excellent in regard to her wound this is continue to show signs of improvement and overall I am extremely pleased with where we stand today. She is actually here for PuraPly application #5. Every time we have applied this  she is noted definite improvement on measurements. 01/23/2021 upon  evaluation today patient is actually making good progress in regard to her wound. This was actually on just a little bit longer this time compared to previous due to the fact that she did have to go out of town. She is actually here for PuraPly application #6. We have definitely been seeing improvements in the overall quality of the tissue on the surface of the wound which is awesome news. In general I think that the patient seems to be continuing to make great progress here. 01/30/2021 upon evaluation today patient's wound is actually doing excellent. There is really not any significant biofilm buildup which is great news and overall I am extremely pleased with where things stand today. There does not appear to be any signs of active infection. No fevers, chills, nausea, vomiting, or diarrhea. 02/06/2021 upon evaluation today patient's wound is actually showing signs of excellent improvement which is great news. We continue to see the benefit of the PuraPly this is doing a great job the wound seems not really irritated whatsoever and is showing signs of good granulation at this point. Overall I am extremely happy with what we are seeing. The patient likewise is happy to hear all of this as well. 02/13/2021 upon evaluation today patient appears to be doing well with regard to her wound. This again is measuring smaller today and I am very pleased in this regard. Fortunately there does not appear to be any signs of active infection at this time which is good news from a systemic standpoint. Locally there is still some significant drainage which she does have is concerned about infection locally. No fevers, chills, nausea, vomiting, or diarrhea. 02/20/2021 upon evaluation today patient actually appears to be doing decently well in regard to her wound. She has been tolerating the dressing changes without complication. I do believe the PuraPly is helping wound bed does appear to be doing better. There is no  evidence of active infection locally or systemically at this point visually although on fluorescence imaging there still appear to be bacterial load and bioburden noted. 02/27/2021 upon evaluation today patient fortunately continues to show signs of improvement with use of the PuraPly currently. Subsequently we did review her culture results and to be honest I think that the Bactrim is probably the best option to have her continue at this point. For that reason I am get a go ahead and send in a refill today for her for an additional 2 weeks. Nonetheless I think that we are making excellent progress here. It was Enterococcus and Staphylococcus that were noted she is allergic to penicillin so there is not much I can do from the Enterococcus standpoint though the staff does seem to be sensitive to the Bactrim. 03/06/2021 upon evaluation today patient appears to be doing well with regard to her wound. This is definitely showing signs of improvement which is great news and overall I am extremely pleased with where things stand today. There does not appear to be any signs of active infection which is great news and overall and I do believe that we are headed in the appropriate direction I think the PuraPly is doing an awesome job for her. 03/20/2021 upon evaluation today patient actually appears to be doing well with regard to her wound. We have been using the PuraPly although last week when I was out of the office they actually just used endoform nonetheless this still seems to be doing great.  Fortunately there does not appear to be any signs of active infection which is great news overall I think the patient is making wonderful progress. 03/27/2021 upon evaluation today patient appears to be doing well with regard to her wound in fact there is a little drainage but otherwise her does not appear to be any major signs of open wounds nor infection at this time. Overall I am extremely pleased with where things  stand. No fevers, chills, nausea, vomiting, or diarrhea. 04/03/2021 upon evaluation today patient presents for reevaluation here in the clinic and overall she seems to be doing quite well. Fortunately there does not appear to be any signs of active infection which is great news and in general I am very pleased with how things appear. There is still a very small opening remaining but in general she is looking much better. Fortunately I do not think there is any need for the PuraPly at this point. 04/10/2021 upon evaluation today patient's wound is actually showing signs of excellent improvement. I am actually very pleased with where it stands today. I think she is very close to complete closure in fact this may be healed today. Either way I think that she may benefit from using her own compression socks for the next week and then will subsequently see how things stand following. 04/17/2021 upon evaluation today patient appears to be doing well with regard to her wound on the ankle region. This is actually showing signs of excellent improvement which is great news. I do not see any signs of active infection at this time which is great and no open wound. With that being said on the lateral portion of her ankle right at the base of her plate she actually has an area of pressure that occurred over the past week when she was not in the wrap or we been padding her. This does seem to be an issue which I think is good to be an ongoing problem I am can actually get in touch with Dr. [name] today to see what we can do in that regard. 04/24/2021 upon evaluation today patient appears to be doing well with regard to her ankle region for the most part although there does appear to be some fluid trapped underneath the area where we thought this was healed last week. Unfortunately I think at this time that we can have to reopen and clean away some of this area and then subsequently depending on how things do over the  next week or so we will see about discharge but right now I do not think that is the right way to go. She voiced understanding. Electronic Signature(s) Signed: 04/24/2021 11:50:43 AM By: [name] PA-C Entered By: [name] on 04/24/2021 11:50:43 -------------------------------------------------------------------------------- Physical Exam Details Patient Name: Date of Service: [facility] [name], [name] 04/24/2021 9:45 A M Medical Record Number: [id number] Patient Account Number: [id number] Date of Birth/Sex: Treating RN: Nov 23, 1955 (65 y.o. [name] Provider: [name] Other Clinician: Referring Provider: Treating Provider/Extender: [name] in Treatment: 24 Constitutional Well-nourished and well-hydrated in no acute distress. Respiratory normal breathing without difficulty. Psychiatric this patient is able to make decisions and demonstrates good insight into disease process. Alert and Oriented x 3. pleasant and cooperative. Notes Upon inspection patient's wound bed actually showed signs of descent granulation epithelization though she did have some drainage overlying what appeared to be a trapped area of fluid in the center part  of the wound that has been the more concerned I did have to clear that away today. She tolerated that without complication and postdebridement the wound bed appears to be doing significantly better which is great news. Electronic Signature(s) Signed: 04/24/2021 11:51:12 AM By: [name] PA-C Entered By: [name] on 04/24/2021 11:51:12 -------------------------------------------------------------------------------- Physician Orders Details Patient Name: Date of Service: [facility] [name], [name] 04/24/2021 9:45 A M Medical Record Number: [id number] Patient Account Number: [id number] Date of Birth/Sex: Treating RN: [birth year] (65 y.o. 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 - with Margarita Grizzle Return A Edema Control - Lymphedema / SCD / Other Patient to wear own compression stockings every day. - May use an ace wrap over the holiday long weekend Non Wound Condition pply the following to affected area as directed: - Apply silicone bordered foam to left lateral foot for protection until your f/u with Dr. [name] A Wound Treatment Wound #15R - Malleolus Wound Laterality: Left, Medial Cleanser: Wound Cleanser (DME) (Generic) 1 x Per Day/15 Days Discharge Instructions: Cleanse the wound with wound cleanser prior to applying a clean dressing using gauze sponges, not tissue or cotton balls. Prim Dressing: Maxorb Extra Calcium Alginate 2x2 in (DME) (Generic) 1 x Per Day/15 Days ary Discharge Instructions: Apply calcium alginate to wound bed as instructed Secondary Dressing: Woven Gauze Sponge, Non-Sterile 4x4 in (DME) (Generic) 1 x Per Day/15 Days Discharge Instructions: Apply over primary dressing as directed. Secondary Dressing: Zetuvit Plus Silicone Border Dressing 4x4 (in/in) (DME) (Generic) 1 x Per Day/15 Days Discharge Instructions: Apply silicone border over primary dressing as directed. Compression Wrap: 82M ACE Elastic Bandage With VELCRO Brand Closure, 4 (in) (DME) (Generic) 1 x Per Day/15 Days Discharge Instructions: may use instead of compression stocking for this week Electronic Signature(s) Signed: 04/24/2021 3:00:15 PM By: [name]  RN Signed: 04/24/2021 5:33:12 PM By: [name] PA-C Entered By: [name] on 04/24/2021 10:57:24 -------------------------------------------------------------------------------- Problem List Details Patient Name: Date of Service: [name], [name] 04/24/2021 9:45 A M Medical Record Number: [id number] Patient Account Number: [id number] Date of Birth/Sex: Treating RN: 11-14-1955 (65 y.o. 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 01/17/2020 No Yes Inactive Problems ICD-10 Code Description Active Date Inactive Date L03.116 Cellulitis of left lower limb 01/31/2020 01/31/2020 T81.31XD Disruption of external operation (surgical) wound, not elsewhere classified, subsequent 01/17/2020 01/17/2020 encounter Resolved Problems Electronic Signature(s) Signed: 04/24/2021 10:21:07 AM By: [name] PA-C Entered By: [name] on 04/24/2021 10:21:06 -------------------------------------------------------------------------------- Progress Note Details Patient Name: Date of Service: [facility] [name], [name] 04/24/2021 9:45 A M Medical Record Number: [id number] Patient Account Number:  [id number] Date of Birth/Sex: Treating RN: [birth year] (65 y.o. [name] Provider: [name] Other Clinician: Referring Provider: Treating Provider/Extender: [name] in Treatment: 41 Subjective Chief Complaint Information obtained  from Patient patient is been followed long-term in this clinic for venous insufficiency ulcers with inflammation, hypertension and ulceration over the medial ankle bilaterally. 01/17/2020; this is a patient who is here for review of postoperative wounds on the left lateral ankle and recurrence of venous stasis ulceration on the left medial History of Present Illness (HPI) the remaining wound is over the left medial ankle. Similar wound over the right medial ankle healed largely with use of Apligraf. Most recently we have been using Hydrofera Blue over this wound with considerable improvement. The patient has been extensively worked up in the past for her venous insufficiency and she is not a candidate for antireflux surgery although I have none of the details available currently. 08/24/14; considerable improvement today. About 50% of this wound areas now epithelialized. The base of the wound appears to be healthier granulation.as opposed to last week when she had deteriorated a considerable improvement 08/17/14; unfortunately the wound has regressed somewhat. The areas of epithelialization from the superior aspect are not nearly as healthy as they were last week. The patient thinks her Hydrofera Blue slipped. 09/07/14; unfortunately the area has markedly regressed in the 2 weeks since I've seen this. There is an odor surrounding erythema. The healthy granulation tissue that we had at the base of the wound now is a dusky color. The nurse reports green drainage 09/14/14; the area looks somewhat better than last week. There is less erythema and less drainage. The culture I did did not show any growth. Nevertheless I think it is better to continue the Cipro and doxycycline for a further week. The remaining wound area was debridement. 09/21/14. Wound did not require debridement last week. Still less erythema and less drainage. She can complete her antibiotics. The areas of epithelialization in the superior  aspect of the wound do not look as healthy as they did some weeks ago 10/05/14 continued improvement in the condition of this wound. There is advancing epithelialization. Less aggressive debridement required 10/19/14 continued improvement in the condition and volume of this wound. Less aggressive debridement to the inferior part of this to remove surface slough and fibrinous eschar 11/02/14 no debridement is required. The surface granulation appears healthy although some of her islands of epithelialization seem to have regressed. No evidence of infection 11/16/14; lites surface debridement done of surface eschar. The wound does not look to be unhealthy. No evidence of infection. Unfortunately the patient has had podiatry issues in the right foot and for some reason has redeveloped small surface ulcerations in the medial right ankle. Her original presentation involved wounds in this area 11/23/14 no debridement. The area on the right ankle has enlarged. The left ankle wound appears stable in terms of the surface although there is periwound inflammation. There has been regression in the amount of new skin 11/30/14 no debridement. Both wound areas appear healthy. There was no evidence of infection. The the new area on the right medial ankle has enlarged although that both the surfaces appear to be stable. 12/07/14; Debridement of the right medial ankle wound. No no debridement was done on the left. 12/14/14 no major change in and now bilateral medial ankle wounds. Both of these are very painful but the no overt evidence of infection. She has had previous venous ablation  12/21/14; patient states that her right medial ankle wound is considerably more painful last week than usual. Her left is also somewhat painful. She could not tolerate debridement. The right medial ankle wound has fibrinous surface eschar 12/28/14 this is a patient with severe bilateral venous insufficiency ulcers. For a considerable period of time  we actually had the one on the right medial ankle healed however this recently opened up again in June. The left medial ankle wound has been a refractory area with some absent flows. We had some success with Hydrofera Blue on this area and it literally closed by 50% however it is recently opened up Foley. Both of these were debridement today of surface eschar. She tolerates this poorly 01/25/15: No change in the status of this. Thick adherent escar. Very poor tolerance of any attempt at debridement. I had healed the right medial malleolus wound for a considerable amount of time and had the left one down to about 50% of the volume although this is totally regressed over the last 48 weeks. Further the right leg has reopened. she is trying to make a appointment with pain and vascular, previous ablations with Dr. Aleda Grana. I do not believe there is an arterial insufficiency issue here 02/01/15 the status of the adherent eschar bilaterally is actually improved. No debridement was done. She did not manage to get vascular studies done 02/08/15 continued debridement of the area was done today. The slough is less adherent and comes off with less pressure. There is no surrounding infection peripheral pulses are intact 02/15/15 selective debridement with a disposable curette. Again the slough is less adherent and comes off with less difficulty. No surrounding infection peripheral pulses are intact. 02/22/15 selective debridement of the right medial ankle wound. Slough comes off with less difficulty. No obvious surrounding infection peripheral pulses are intact I did not debridement the one on the left. Both of these are stable to improved 03/01/15 selective debridement of both wound areas using a curette to. Adherent slough cup soft with less difficulty. No obvious surrounding infection. The patient tells me that 2 days ago she noted a rash above the right leg wrap. She did not have this on her lower legs when she  change this over she arrives with widespread left greater than right almost folliculitis-looking rash which is extremely pruritic. I don't see anything to culture here. There is no rash on the rest of her body. She feels well systemically. 03/08/15; selective debridement of both wounds using a curette. Base of this does not look unhealthy. She had limegreen drainage coming out of the left leg wound and describes a lot of drainage. The rash on her left leg looks improved to. No cultures were done. 03/22/15; patient was not here last week. Basal wounds does not look healthy and there is no surrounding erythema. No drainage. There is still a rash on the left leg that almost looks vasculitic however it is clearly limited to the top of where the wrap would be. 04/05/15; on the right required a surgical debridement of surface eschar and necrotic subcutaneous tissue. I did not debridement the area on the left. These continue to be large open wounds that are not changing that much. We were successful at one point in healing the area on the right, and at the same time the area on the left was roughly half the size of current measurements. I think a lot of the deterioration has to do with the prolonged time the patient is on  her feet at work 04/19/15 I attempted-like surface debridement bilaterally she does not tolerate this. She tells me that she was in allergic care yesterday with extreme pain over her left lateral malleolus/ankle and was told that she has an "sprain" 05/03/15; large bilateral venous insufficiency wounds over the medial malleolus/medial aspect of her ankles. She complains of copious amounts of drainage and his usual large amounts of pain. There is some increasing erythema around the wound on the right extending into the medial aspect of her foot to. historically she came in with these wounds the right one healed and the left one came down to roughly half its current size however the right one is  reopened and the left is expanded. 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 05/10/15 large bilateral venous insufficiency wounds. There is less adherence surface left however the surface culture that I did last week grew pseudomonas therefore bilateral selective debridement score necessary. There is surrounding erythema. The patient describes severe bilateral drainage and a lot of pain in the left ankle. Apparently her podiatrist was were ready to do a cortisone shot 05/17/15; the patient complains of pain and again copious amounts of drainage. 05/24/15; we used Iodo flex last week. Patient notes considerable improvement in wound drainage. Only needed to change this once. 05/31/15; we continued Iodoflex; the base of these large wounds bilaterally is not too bad but there is probably likely a significant bioburden here. I would like to debridement just doesn't tolerate it. 06/06/14 I would like to continue the Iodoflex although she still hasn't managed to obtain supplies. She has bilateral medial malleoli or large wounds which are mostly superficial. Both of them are covered circumferentially with some nonviable fibrinous slough although she tolerates debridement very poorly. She apparently has an appointment for an ablation on the right leg by interventional radiology. 06/14/15; the patient arrives with the wounds and static condition. We attempted a debridement although she does not do well with this secondary to pain. I 07/05/15; wounds are not much smaller however there appears to be a cleaner granulating base. The left has tight fibrinous slough greater than the right. Debridement is tolerated poorly due to pain. Iodoflex is done more for these wounds in any of the multitude of different dressings I have tried on the left 1 and then subsequently the right. 07/12/15; no change in the condition of this wound. I am able to do an aggressive debridement on  the right but not the left. She simply cannot tolerate it. We have been using Iodoflex which helps somewhat. It is worthwhile remembering that at one point we healed the right medial ankle wound and the left was about 25% of the current circumference. We have suggested returning to vascular surgery for review of possible further ablations for one reason or another she has not been able to do this. 07/26/15 no major change in the condition of either wound on her medial ankle. I did not attempt to debridement of these. She has been aggressively scrubbing these while she is in the shower at home. She has her supply of Iodoflex which seems to have done more for these wounds then anything I have put on recently. 08/09/15 wound area appears larger although not verified by measurements. Using Iodoflex 09/05/2015 -- she was here for avisit today but had significant problems with the wound and I was asked to see her for a physician opinion. I  have summarize that this lady has had surgery on her left lower extremity about 10 years ago where the possible veins stripping was done. She has had an opinion from interventional radiology around November 2016 where no further sclerotherapy was ordered. The patient works 12 hours a day and stands on a concrete floor with work boots and is unable to get the proper compression she requires and cannot elevate her limbs appropriately at any given time. She has recently grown Pseudomonas from her wound culture but has not started her ciprofloxacin which was called in for her. 09/13/15 this continues to be a difficult situation for this patient. At one point I had this wound down to a 1.5 x 1.5" wound on her left leg. This is deteriorated and the right leg has reopened. She now has substantial wounds on her medial calcaneus, malleoli and into her lower leg. One on the left has surface eschar but these are far too painful for me to debridement here. She has a vascular surgery  appointment next week to see if anything can be done to help here. I think she has had previous ablations several years ago at [facility]. She has no major edema. She tells me that she did not get product last time [facility] Ag] and went for several days without it. She continues to work in work boots 12 hours a day. She cannot get compression/4-layer under her work boots. 09/20/15 no major change. Periwound edema control was not very good. Her point with pain and vascular is next Wednesday the 25th 09/28/15; the patient is seen vascular surgery and is apparently scheduled for repeat duplex ultrasounds of her bilateral lower legs next week. 10/05/15; the patient was seen by Dr. Doren Custard of vascular surgery. He feels that she should have arterial insufficiency excluded as cause/contributed to her nonhealing stage she is therefore booked for an arteriogram. She has apparently monophasic signals in the dorsalis pedis pulses. She also of course has known severe chronic venous insufficiency with previous procedures as noted previously. I had another long discussion with the patient today about her continuing to work 12 hour shifts. I've written her out for 2 months area had concerns about this as her work location is currently undergoing significant turmoil and this may lead to her termination. She is aware of this however I agree with her that she simply cannot continue to stand for 12 hours multiple days a week with the substantial wound areas she has. 10/19/15; the Dr. Doren Custard appointment was largely for an arteriogram which was normal. She does not have an arterial issue. He didn't make a comment about her chronic venous insufficiency for which she has had previous ablations. Presumably it was not felt that anything additional could be done. The patient is now out of work as I prescribed 2 weeks ago. Her wounds look somewhat less aggravated presumably because of this. I felt I would give debridement another  try today 10/25/15; no major change in this patient's wounds. We are struggling to get her product that she can afford into her own home through her insurance. 11/01/15; no major change in the patient's wounds. I have been using silver alginate as the most affordable product. I spoke to Dr. Marla Roe last week with her requested take her to the OR for surgical debridement and placement of ACEL. Dr. Marla Roe told me that she would be willing to do this however [facility] will not cover this, fortunately the patient has [place] healthcare of  some variant 11/08/15; no major change in the patient's wounds. She has been completely nonviable surface that this but is in too much pain with any attempted debridement are clinic. I have arranged for her to see Dr. Marla Roe ham of plastic surgery and this appointment is on Monday. I am hopeful that they will take her to the OR for debridement, possible ACEL ultimately possible skin graft 11/22/15 no major change in the patient's wounds over her bilateral medial calcaneus medial malleolus into the lower legs. Surface on these does not look too bad however on the left there is surrounding erythema and tenderness. This may be cellulitis or could him sleepy tinea. 11/29/15; no major changes in the patient's wounds over her bilateral medial malleolus. There is no infection here and I don't think any additional antibiotics are necessary. There is now plan to move forward. She sees Dr. Marla Roe in a week's time for preparation for operative debridement and ACEL placement I believe on 7/12. She then has a follow-up appointment with Dr. Marla Roe on 7/21 12/28/15; the patient returns today having been taken to the [facility] by Dr. Marla Roe 12/12/15 she underwent debridement, intraoperative cultures [which were negative]. She had placement of a wound VAC. Parent really ACEL was not available to be placed. The wound VAC foam apparently adhered to the wound since then  she's been using silver alginate, Xeroform under Ace wraps. She still says there is a lot of drainage and a lot of pain 01/31/16; this is a patient I see monthly. I had referred her to Dr. Marla Roe him of plastic surgery for large wounds on her bilateral medial ankles. She has been to the OR twice once in early July and once in early August. She tells me over the last 3 weeks she has been using the wound VAC with ACEL underneath it. On the right we've simply been using silver alginate. Under Kerlix Coban wraps. 02/28/16; this is a patient I'm currently seeing monthly. She is gone on to have a skin graft over her large venous insufficiency ulcer on the left medial ankle. This was done by Dr. Marla Roe him. The patient is a bit perturbed about why she didn't have one on her right medial ankle wound. She has been using silver alginate to this. 03/06/16; I received a phone call from her plastic surgery Dr. Marla Roe. She expressed some concern about the viability of the skin graft she did on the left medial ankle wound. Asked me to place Endoform on this. She told me she is not planning to do a subsequent skin graft on the right as the left one did not take very well. I had placed Hydrofera Blue on the right 03/13/16; continue to have a reasonably healthy wound on the right medial ankle. Down to 3 mm in terms of size. There is epithelialization here. The area on the left medial ankle is her skin graft site. I suppose the last week this looks somewhat better. She has an open area inferiorly however in the center there appears to be some viable tissue. There is a lot of surface callus and eschar that will eventually need to come off however none of this looked to be infected. Patient states that the is able to keep the dressing on for several days which is an improvement. 03/20/16 no major change in the circumference of either wound however on the left side the patient was at Dr. Eusebio Friendly office and  they did a debridement of left wound. 50% of the  wound seems to be epithelialized. I been using Endoform on the left Hydrofera Blue in the right 03/27/16; she arrives today with her wound is not looking as healthy as they did last week. The area on the right clearly has an adherent surface to this a very similar surface on the left. Unfortunately for this patient this is all too familiar problem. Clearly the Endoform is not working and will need to change that today that has some potential to help this surface. She does not tolerate debridement in this clinic very well. She is changing the dressing wants 04/03/16; patient arrives with the wounds looking somewhat better especially on the right. Dr. Migdalia Dk change the dressing to silver alginate when she saw her on Monday and also sold her some compression socks. The usefulness of the latter is really not clear and woman with severely draining wounds. 04/10/16; the patient is doing a bit of an experiment wearing the compression stockings that Dr. Migdalia Dk provided her to her left leg and the out of legs based dressings that we provided to the right. 05/01/16; the patient is continuing to wear compression stockings Dr. Migdalia Dk provided her on the left that are apparently silver impregnated. She has been using Iodoflex to the right leg wound. Still a moderate amount of drainage, when she leaves here the wraps only last for 4 days. She has to change the stocking on the left leg every night 05/15/16; she is now using compression stockings bilaterally provided by Dr. Marla Roe. She is wearing a nonadherent layer over the wounds so really I don't think there is anything specific being done to this now. She has some reduction on the left wound. The right is stable. I think all healing here is being done without a specific dressing 06/09/16; patient arrives here today with not much change in the wound certainly in diameter to large circular wounds over the medial  aspect of her ankle bilaterally. Under the light of these services are certainly not viable for healing. There is no evidence of surrounding infection. She is wearing compression stockings with some sort of silver impregnation as prescribed by Dr. Marla Roe. She has a follow-up with her tomorrow. 06/30/16; no major change in the size or condition of her wounds. These are still probably covered with a nonviable surface. She is using only her purchase stockings. She did see Dr. Marla Roe who seemed to want to apply Dakin's solution to this I'm not extreme short what value this would be. I would suggest Iodoflex which she still has at home. 07/28/16; I follow Mrs. Crance episodically along with Dr. Marla Roe. 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 She has been applying a topical collagen ointment to both wounds with Adaptic. I don't think Dr. Marla Roe is planning to take her back to the OR. 08/19/16; I follow Mrs. Jeneen Rinks on a monthly basis along with Dr. Marla Roe of plastic surgery. 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 I been following her for a number of years. At one point I was able to get the right medial malleolus wound to heal and had the left medial malleolus down to about half its current size however and I had to send her to plastic surgery for an operative debridement. Since then things have been stable to slightly improve the area on the right is slightly better one in the left about the same although there is  much less adherent surface than I'm used to with this patient. She is using some form of liquid collagen gel that Dr. Marla Roe provided a Kerlix cover with the patient's own pressure stockings. She tells me that she has extreme pain in both ankles and along the lateral aspect of both feet. She has been unable to work for some period of time.  She is telling me she is retiring at the beginning of April. She sees Dr. Doran Durand of orthopedics next week 09/22/16; patient has not seen Dr. Marla Roe since the last time she is here. I'm not really sure what she is using to the wounds other than bits and pieces of think she had left over including most recently Hydrofera Blue. She is using juxtalite stockings. She is having difficulty with her husband's recent illness "stroke". She is having to transport him to various doctors appointments. Dr. Marla Roe left her the option of a repeat debridement with ACEL however she has not been able to get the time to follow-up on this. She continues to have a fair amount of drainage out of these wounds with certainly precludes leaving dressings on all week 10/13/16; patient has not seen Dr. Marla Roe since she was last in our clinic. I'm not really sure what she is doing with the wounds, we did try to get her [employer] and I think she is actually using this most of the time. Because of drainage she states she has to change this every second day although this is an improvement from what she used to do. She went to see Dr. Doran Durand who did not think she had a muscular issue with regards to her feet, he referred her to a neurologist and I think the appointment is sometime in June. I changed her back to Iodoflex which she has used in the past but not recently. 11/03/16; the patient has been using Iodoflex although she ran out of this. Still claims that there is a lot of drainage although the wound does not look like this. No surrounding erythema. She has not been back to see Dr. Marla Roe 11/24/16; the patient has been using Iodoflex again but she ran out of it 2 or 3 days ago. There is no major change in the condition of either one of these wounds in fact they are larger and covered in a thick adherent surface slough/nonviable tissue especially on the left. She does not tolerate mechanical debridement in our  clinic. Going back to see Dr. Marla Roe of plastic surgery for an operative debridement would seem reasonable. 12/15/16; the patient has not been back to see Dr. Marla Roe. She is been dealing with a series of illnesses and her husband which of monopolized her time. She is been using Sorbact which we largely supplied. She states the drainage is bad enough that it maximum she can go 2-3 days without changing the dressing 01/12/2017 -- the patient has not been back for about 4 weeks and has not seen Dr. Marla Roe not does she have any appointment pending. 01/23/17; patient has not seen Dr. Marla Roe even though I suggested this previously. She is using Santyl that was suggested last week by Dr. Con Memos this Cost her $16 through her insurance which is indeed surprising 02/12/17; continuing Santyl and the patient is changing this daily. A lot of drainage. She has not been back to see plastic surgery she is using an Ace wrap. Our intake nurse suggested wrap around stockings which would make a good reasonable alternative 02/26/17; patient is been  using Santyl and changing this daily due to drainage. She has not been to see plastic surgery she uses in April Ace wrap to control the edema. She did obtain extremitease stockings but stated that the edema in her leg was to big for these 03/20/17; patient is using Santyl and Anasept. Surfaces looked better today the area on the right is actually measuring a little smaller. She has states she has a lot of pain in her feet and ankles and is asking for a consult to pain control which I'll try to help her with through our case manager. 04/10/17; the patient arrives with better-looking wound surfaces and is slightly smaller wound on the left she is using a combination of Santyl and Anasept. She has an appointment or at least as started in the pain control center associated with [facility] 05/14/17; this is a patient who I followed for a prolonged period of time.  She has venous insufficiency ulcers on her bilateral medial ankles. At one point I had this down to a much smaller wound on the left however these reopened and we've never been able to get these to heal. She has been using Santyl and Anasept gel although 2 weeks ago she ran out of the Anasept gel. She has a stable appearance of the wound. She is going to the wound care clinic at [facility]. They wanted do a nerve block/spinal block although she tells me she is reluctant to go forward with that. 05/21/17; this is a patient I have followed for many years. She has venous insufficiency ulcers on her bilateral medial ankles. Chronic pain and deformity in her ankles as well. She is been to see plastic surgery as well as orthopedics. Using PolyMem AG most recently/Kerramax/ABDs and 2 layer compression. She has managed to keep this on and she is coming in for a nurse check to change the dressing on Tuesdays, we see her on Fridays 06/05/17; really quite a good looking surface and the area especially on the right medial has contracted in terms of dimensions. Well granulated healthy-looking tissue on both sides. Even with an open curet there is nothing that even feels abnormal here. This is as good as I've seen this in quite some time. We have been using PolyMem AG and bringing her in for a nurse check 06/12/17; really quite good surface on both of these wounds. The right medial has contracted a bit left is not. We've been using PolyMem and AG and she is coming in for a nurse visit 06/19/17; we have been using PolyMem AG and bringing her in for a nurse check. Dimensions of her wounds are not better but the surfaces looked better bilaterally. She complained of bleeding last night and the left wound and increasing pain bilaterally. She states her wound pain is more neuropathic than just the wounds. There was some suggestion that this was radicular from her pain management doctor in talking to her it is really  difficult to sort this out. 06/26/17; using PolyMem and AG and bringing her in for a nurse check as All of this and reasonably stable condition. Certainly not improved. The dimensions on the lateral part of the right leg look better but not really measuring better. The medial aspect on the left is about the same. 07/03/16; we have been using PolyMen AG and bringing her in for a nurse check to change the dressings as the wounds have drainage which precludes once weekly changing. We are using all secondary absorptive dressings.our  intake nurse is brought up the idea of using a wound VAC/snap VAC on the wound to help with the drainage to see if this would result in some contraction. This is not a bad idea. The area on the right medial is actually looking smaller. Both wounds have a reasonable-looking surface. There is no evidence of cellulitis. The edema is well controlled 07/10/17; the patient was denied for a snap VAC by her insurance. The major issue with these wounds continues to be drainage. We are using wicked PolyMem AG and she is coming in for a nurse visit to change this. The wounds are stable to slightly improved. The surface looks vibrant and the area on the right certainly has shrunk in size but very slowly 07/17/17; the patient still has large wounds on her bilateral medial malleoli. Surface of both of these wounds looks better. The dimensions seem to come and go but no consistent improvement. There is no epithelialization. We do not have options for advanced treatment products due to insurance issues. They did not approve of the wound VAC to help control the drainage. More recently we've been using PolyMem and AG wicked to allow drainage through. We have been bringing her in for a nurse visit to change this. We do not have a lot of options for wound care products and the home again due to insurance issues 07/24/17; the patient's wound actually looks somewhat better today. No drainage measurements  are smaller still healthy-looking surface. We used silver collagen under PolyMen started last week. We have been bringing her in for a dressing change 07/31/17; patient's wound surface continued to look better and I think there is visible change in the dimensions of the wound on the right. Rims of epithelialization. We have been using silver collagen under PolyMen and bringing her in for a dressing change. There appears to be less drainage although she is still in need of the dressing change 08/07/17. Patient's wound surface continues to look better on both sides and the area on the right is definitely smaller. We have been using silver collagen and PolyMen. She feels that the drainage has been it has been better. I asked her about her vascular status. She went to see Dr. Aleda Grana at [facility] and had some form of ablation. I don't have much detail on this. I haven't my notes from 2016 that she was not a candidate for any further ablation but I don't have any more information on this. We had referred her to vein and vascular I don't think she ever went. He does not have a history of PAD although I don't have any information on this either. We don't even have ABIs in our record 08/14/17; we've been using silver collagen and PolyMen cover. And putting the patient and compression. She we are bringing her in as a nurse visit to change this because ofarge amount of drainage. We didn't the ABIs in clinic today since they had been done in many moons 1.2 bilaterally. She has been to see vein and vascular however this was at [facility] and she had ablation although I really don't have any information on this all seemed biking get a report. She is also been operatively debrided by plastic surgery and had a cell placed probably 8-12 months ago. This didn't have a major effect. We've been making some gains with current dressings 08/19/17-She is here in follow-up evaluation for bilateral medial malleoli  ulcers. She continues to tolerate debridement very poorly. We will continue  with recently changed topical treatment; if no significant improvement may consider switching to Iodosorb/Iodoflex. 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 She has been using silver collagen and PolyMem. I believe she has been used and tried on Iodoflex before. 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 These have been very refractory over time. We have been using silver collagen and PolyMen. She is coming in once a week for a doctor's and once a week for nurse visits. We are actually making some progress 09/18/17; the patient's wounds are smaller especially on the right medial. She arrives today to upset to consider even washing these off with Anasept which I think is been part of the reason this is been closing. We've been using collagen covered in PolyMen otherwise. It is noted that she has a small area of folliculitis on the right medial calf that. As we are wrapping her legs I'll give her a short course of doxycycline to make sure this doesn't amount to anything. She is a long list of complaints today including imbalance, shortness of breath on exertion, inversion of her left ankle. With regards to the latter complaints she is been to see orthopedics and they offered her a tendon release surgery I believe but wanted her wounds to be closed first. I have recommended she go see her primary physician with regards to everything else. 09/25/17; patient's wounds are about the same size. We have made some progress bilaterally although not in recent weeks. She will not allow me T wash these wounds with Anasept even if she is doing her cell. Wheeze we've been using collagen covered in PolyMen.  Last week she had a small area of folliculitis this is now opened into a small wound. She completed 5 days of trimethoprim sulfamethoxazole 10/02/17; unfortunately the area on her left medial ankle is worse with a larger wound area towards the Achilles. The patient complains of a lot of pain. She will not allow debridement although visually I don't think there is anything to debridement in any case. We have been using silver collagen and PolyMen for several months now. Initially we are making some progress although I'm not really seeing that today. We will move back to [facility]. His admittedly this is a bit of a repeat however I'm hoping that his situation is different now. The patient tells me she had her leg on the left give out on her yesterday this is process some pain. 10/09/17; the patient is seen twice a week largely because of drainage issues coming out of the chronic medial bimalleolar wounds that are chronic. Last week the dimensions of the one on the left looks a little larger I changed her to [facility]. She comes in today with a history of terrible pain in the bilateral wound areas. She will not allow debridement. She will not even allow a tissue culture. There is no surrounding erythema no no evidence of cellulitis. We have been putting her Kerlix Coban man. She will not allow more aggressive compression as there was a suggestion to put her in 3 layer wraps. 10/16/17; large wounds on her bilateral medial malleoli. These are chronic. Not much change from last week. The surface looks have healthy but absolutely no epithelialization. A lot of pain little less so of  drainage. She will not allow debridement or even washing these off in the vigorous fashion with Anasept. 10/23/17; large wounds on her bilateral malleoli which are chronic. Some improvement in terms of size perhaps on the right since last time I saw these. She states that after we increased the 3 layer compression there was  some bleeding, when she came in for a nurse visit she did not want 3 layer compression put back on about our nurse managed to convince her. She has known chronic venous visit issues and I'm hoping to get her to tolerate the 3 layer compression. using Hydrofera Blue 10/30/17; absolutely no change in the condition of either wound although we've had some improvement in dimensions on the right.. Attempted to put her in 3 layer compression she didn't tolerated she is back in 2 layer compression. We've been using Hydrofera Blue We looked over her past records. She had venous reflux studies in November 2016. There was no evidence of deep venous reflux on the right. Superficial vein did not show the greater saphenous vein at think this is been previously ablated the small saphenous vein was within normal limits. The left deep venous system showed no DVT the vessels were positive for deep venous reflux in the posterior tibial veins at the ankle. The greater saphenous vein was surgically absent small saphenous vein was within normal limits. She went to vein and vascular at [facility]. I believe she had an ablation on the left greater saphenous vein. I'll update her reflux studies perhaps ever reviewed by vein and vascular. We've made absolutely no progress in these wounds. Will also try to read and TheraSkins through her insurance 11/06/17; W the patient apparently has a 2 week follow-up with vein and vascular I like him to review the whole issue with regards to her previous vascular workup by Dr. Aleda Grana. We've really made no progress on these wounds in many months. She arrives today with less viable looking surface on the left medial ankle wound. This was apparently looking about the same on Tuesday when she was here for nurse visit. 11/13/17; deep tissue culture I did last time of the left lower leg showed multiple organisms without any predominating. In particular no Staphylococcus or group A strep  were isolated. We sent her for venous reflux studies. She's had a previous left greater saphenous vein stripping and I think sclerotherapy of the right greater saphenous vein. She didn't really look at the lesser saphenous vein this both wounds are on the medial aspect. She has reflux in the common femoral vein and popliteal vein and an accessory vein on the right and the common femoral vein and popliteal vein on the left. I'm going to have her go to see vein and vascular just the look over things and see if anything else beside aggressive compression is indicated here. We have not been able to make any progress on these wounds in spite of the fact that the surface of the wounds is never look too bad. 11/20/17; no major change in the condition of the wounds. Patient reports a large amount of drainage. She has a lot of complaints of pain although enlisting her today I wonder if some of this at least his neuropathic rather than secondary to her wounds. She has an appointment with vein and vascular on 12/30/17. The refractory nature of these wounds in my mind at least need vein and vascular to look over the wounds the recent reflux studies we did and her history  to see if anything further can be done here. I also note her gait is deteriorated quite a bit. 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 She has a bilateral Trendelenburg gait. I wonder if this is neuropathic or perhaps multilevel radicular. 11/27/17; her wounds actually looks slightly better. Healthy-looking granulation tissue a scant amount of epithelialization. [place] [facility] will not pay for [employer]. They will play for tri layer Oasis and Dermagraft. This is not a diabetic ulcer. We'll try for the tri layer Oasis. She still complains of some drainage. She has a vein and vascular appointment on 12/30/17 12/04/17; the wounds visually look quite good. Healthy-looking granulation with some degree of epithelialization. We are still  waiting for response to our request for trial to try layer Oasis. Her appointment with vascular to review venous and arterial issues isn't sold the end of July 7/31. Not allow debridement or even vigorous cleansing of the wound surface. 12/18/17; slightly smaller especially on the right. 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 We've been using Hydrofera Blue. We still are looking into triple layer Oasis through her insurance 01/08/18 on evaluation today patient's wound actually appears to be showing signs of good improvement at this point in time. She has been tolerating the dressing changes without complication. Fortunately there does not appear to be any evidence of infection at this point in time. We have been utilizing silver nitrate which does seem to be of benefit for her which is also good news. Overall I'm very happy with how things seem to be both regards appearance as well as measurement. Patient did see Dr. Bridgett Larsson for evaluation on 12/30/17. In his assessment he felt that stripping would not likely add much more than chronic compression to the patient's healing process. His recommendation was to follow-up in three months with Dr. Doren Custard if she hasn't healed in order to consider referral back to you and see vascular where she previously was in a trial and was able to get her wound to heal. I'll be see what she feels she when you staying compression and he reiterated this as well. 01/13/18 on evaluation today patient appears to actually be doing very well in regard to her bilateral medial malleolus ulcers. She seems to have tolerated the chemical cauterization with silver nitrate last week she did have some pain through that evening but fortunately states that I'll be see since it seems to be doing better she is overall pleased with the progress. 01/21/18; really quite a remarkable improvement since I've last seen these wounds. We started using silver nitrate specially  on the islands of hyper granulation which for some reason her around the wound circumference. This is really done quite nicely. Primary dressing Hydrofera Blue under 4 layer compression. She seems to be able to hold out without a nurse rewrap. Follow-up in 1 week 01/28/18; we've continued the hydrofera blue but continued with chemical cauterization to the wound area that we started about a month ago for irregular hyper granulation. She is made almost stunning improvement in the overall wound dimensions. I was not really expecting this degree of improvement in these chronic wounds 02/05/18; we continue with Hydrofera Bluebut of also continued the aggressive chemical cauterization with silver nitrate. We made nice progress with the right greater than left wound. 02/12/18. We continued with Hydrofera Blue after aggressive chemical cauterization with silver nitrate. We appear to be making nice progress with both wound areas 02/19/2018; we continue with [facility] after washing  the wounds vigorously with Anasept spray and chemical cauterization with silver nitrate. We are making excellent progress. The area on the right's just about closed 02/26/2018. The area on the left medial ankle had too much necrotic debris today. I used a #5 curette we are able to get most of the soft. I continued with the silver nitrate to the much smaller wound on the right medial ankle she had a new area on her right lower pretibial area which she says was due to a role in her compression 03/05/2018; both wound areas look healthy. Not much change in dimensions from last week. I continue to use silver nitrate and Hydrofera Blue. The patient saw Dr. Doren Custard of vein and vascular. He felt she had venous stasis ulcers. He felt based on her previous arteriogram she should have adequate circulation for healing. Also she has deep venous reflux but really no significant correctable superficial venous reflux at this time. He felt we should  continue with conservative management including leg elevation and compression 04/02/2018; since we last saw this woman about a month ago she had a fall apparently suffered a pelvic fracture. I did not look up the x-ray. Nevertheless because of pain she literally was bedbound for 2 weeks and had home health coming out to change the dressing. Somewhat predictably this is resulted in considerable improvement in both wound areas. The right is just about closed on the medial malleolus and the left is about half the size. 04/16/2018; both her wounds continue to go down in size. Using Hydrofera Blue. 05/07/18; both her wounds appeared to be improving especially on the right where it is almost closed. We are using Hydrofera Blue 05/14/2018; slightly worse this week with larger wounds. Surface on the left medial not quite as good. We have been using Hydrofera Blue 05/21/18; again the wounds are slightly larger. Left medial malleolus slightly larger with eschar around the circumference. We have been using Hydrofera Blue undergoing a wraps for a prolonged period of time. This got a lot better when she was more recumbent due to a fall and a back injury. I change the primary dressing the silver alginate today. She did not tolerate a 4 layer compression previously although I may need to bring this up with her next time 05/28/2018; area on the left medial malleolus again is slightly larger with more drainage. Area on the right is roughly unchanged. She has a small area of folliculitis on the right medial just on the lower calf. This does not look ominous. 06/03/2018 left medial malleolus slightly smaller in a better looking surface. We used silver nitrate on this last time with silver alginate. The area on the right appears slightly smaller 1/10; left medial malleolus slightly smaller. Small open area on the right. We used silver nitrate and silver alginate as of 2 weeks ago. We continue with the wound and compression.  These got a lot better when she was off her feet 1/17; right medial malleolus wound is smaller. The left may be slightly smaller. Both surfaces look somewhat better. 1/24; both wounds are slightly smaller. Using silver alginate under Unna boots 1/31; both wounds appear smaller in fact the area on the right medial is just about closed. Surface eschar. We have been using silver alginate under Unna boots. The patient is less active now spends let much less time on her feet and I think this is contributed to the general improvement in the wound condition 2/7; both wounds appear smaller. I was hopeful  the right medial would be closed however there there is still the same small open area. Slight amount of surface eschar on the left the dimensions are smaller there is eschar but the wound edges appear to be free. We have been using silver alginate under Unna boot's 2/14; both wounds once again measure smaller. Circumferential eschar on the left medial. We have been using silver alginate under Unna boots with gradual improvement 2/21; the area on the right medial malleolus has healed. The area on the left is smaller. We have been using silver alginate and Unna boots. We can discharge wrapping the right leg she has 20/30 stockings at home she will need to protect the scar tissue in this area 2/28; the area on the right medial malleolus remains closed the patient has a compression stocking. The area on the left is smaller. We have been using silver alginate and Unna boots. 3/6 the area on the right medial ankle remains closed. Good edema control noted she is using her own compression stocking. The area on the left medial ankle is smaller. We have been managing this with silver alginate and Unna boots which we will continue today. 3/13; the area on the right medial ankle remains closed and I'm declaring it healed today. When necessary the left is about the same still a healthy-looking surface but no major  change and wound area. No evidence of infection and using silver alginate under unna and generally making considerable improvement 3/27 the area on the right medial ankle remains closed the area on the left is about the same as last week. Certainly not any worse we have been using silver alginate under an Unna boot 4/3; the area on the right medial ankle remains closed per the patient. We did not look at this wound. The wound on the left medial ankle is about the same surface looks healthy we have been using silver alginate under an Unna boot 4/10; area on the right medial ankle remains closed per the patient. We did not look at this wound. The wound on the left medial ankle is slightly larger. The patient complains that the [facility] caused burning pain all week. She also told us that she was a lot more active this week. Changed her back to silver alginate 4/17; right medial ankle still closed per the patient. Left medial ankle is slightly larger. Using silver alginate. She did not tolerate Hydrofera Blue on this area 4/24; right medial ankle remains closed we have not look at this. The left medial ankle continues to get larger today by about a centimeter. We have been using silver alginate under Unna boots. She complains about 4 layer compression as an alternative. She has been up on her feet working on her garden 5/8; right medial ankle remains closed we did not look at this. The left medial ankle has increased in size about 100%. We have been using silver alginate under Unna boots. She noted increased pain this week and was not surprised that the wound is deteriorated 5/15; no major change in SA however much less erythema ( one week of doxy ocellulitis). 5/22-65 year old female returns at 1 week to the clinic for left medial ankle wound for which we have been using silver alginate under 3 layer compression She was placed on DOXY at last visit - the wound is wider at this visit. She is in 3  layer compression 5/29; change to [facility] last week. I had given her empiric doxycycline 2 weeks ago  for a week. She is in 3 layer compression. She complains of a lot of pain and drainage on presentation today. 6/5; using Hydrofera Blue. I gave her doxycycline recently empirically for erythema and pain around the wound. Believe her cultures showed enterococcus which not would not have been well covered by doxycycline nevertheless the wound looks better and I don't feel specifically that the enterococcus needs to be covered. She has a new what looks like a wrap injury on her lateral left ankle. 6/12; she is using Hydrofera Blue. She has a new area on the left anterior lower tibial area. This was a wrap injury last week. 6/19; the patient is using Hydrofera Blue. She arrived with marked inflammation and erythema around the wound and tenderness. 12/01/18 on evaluation today patient appears to be doing a little bit better based on what I'm hearing from the standpoint of lassos evaluation to this as far as the overall appearance of the wound is concerned. Then sometime substandard she typically sees Dr. Dellia Nims. Nonetheless overall very pleased with the progress that she's made up to this point. No fevers, chills, nausea, or vomiting noted at this time. 7/10; some improvement in the surface area. Aggressively debrided last week apparently. I went ahead with the debridement today although the patient does not tolerate this very well. We have been using Iodoflex. Still a fair amount of drainage 7/17; slightly smaller. Using Iodoflex. 7/24; no change from last week in terms of surface area. We have been using Iodoflex. Surface looks and continues to look somewhat better 7/31; surface area slightly smaller better looking surface. We have been using Iodoflex. This is under Unna boot compression 8/7-Patient presents at 1 week with Unna boot and Iodoflex, wound appears better 8/14-Patient presents at 1 week  with Iodoflex, we use the Unna boot, wound appears to be stable better.Patient is getting Botox treatment for the inversion of the foot for tendon release, Next week 8/21; we are using Iodoflex. Unna boot. The wound is stable in terms of surface area. Under illumination there is some areas of the wound that appear to be either epithelialized or perhaps this is adherent slough at this point I was not really clear. It did not wipe off and I was reluctant to debride this today. 8/28; we are using Iodoflex in an Unna boot. Seems to be making good improvement. 9/4; using Iodoflex and wound is slightly smaller. 9/18; we are using Iodoflex with topical silver nitrate when she is here. The wound continues to be smaller 10/2; patient missed her appointment last week due to GI issues. She left and Iodoflex based dressing on for 2 weeks. Wound is about the same size about the size of a dime on the left medial lower 10/9 we have been using Iodoflex on the medial left ankle wound. She has a new superficial probable wrap injury on the dorsal left ankle 10/16; we have been using Hydrofera Blue since last week. This is on the left medial ankle 10/23; we have been using Hydrofera Blue since 2 weeks ago. This is on the left medial ankle. Dimensions are better 11/6; using Hydrofera Blue. I think the wound is smaller but still not closed. Left medial ankle 11/13; we have been using Hydrofera Blue. Wound is certainly no smaller this week. Also the surface not as good. This is the remanent of a very large area on her left medial ankle. 11/20; using Sorbact since last week. Wound was about the same in terms of size although  I was disappointed about the surface debris 12/11; 3-week follow-up. Patient was on vacation. Wound is measuring slightly larger we have been using Sorbact. 12/18; wound is about the same size however surface looks better last week after debridement. We have been using Sorbact under compression 1/15  wound is probably twice the size of last time increased in length nonviable surface. We have been using Sorbact. She was running a mild fever and missed her appointment last week 1/22; the wound is come down in size but under illumination still a very adherent debris we have been Hydrofera Blue that I changed her to last week 1/29; dimensions down slightly. We have been using Hydrofera Blue 2/19 dimensions are the same however there is rims of epithelialization under illumination. Therefore more the surface area may be epithelialized 2/26; the patient's wound actually measures smaller. The wound looks healthy. We have been using Hydrofera Blue. I had some thoughts about running Apligraf then I still may do that however this looks so much better this week we will delay that for now 3/5; the wound is small but about the same as last week. We have been using Hydrofera Blue. No debridement is required today. 3/19; the wound is about the size of a dime. Healthy looking wound even under illumination. We have been using Hydrofera Blue. No mechanical debridement is necessary 3/26; not much change from last week although still looks very healthy. We have been using Hydrofera Blue under Unna boots Patient was offered an ankle fusion by podiatry but not until the wound heals with a proceed with this. 4/9; the patient comes in today with her original wound on the medial ankle looking satisfactory however she has some uncontrolled swelling in the middle part of her leg with 2 new open areas superiorly just lateral to the tibia. I think this was probably a wrap issue. She said she felt uncomfortable during the week but did not call in. We have been using Hydrofera Blue 4/16; the wound on the medial ankle is about the same. She has innumerable small areas superior to this across her mid tibia. I think this is probably folliculitis. She is also been working in the yard doing a lot of sweating 4/30; the patient  issue on the upper areas across her mid tibia of all healed. I think this was excessive yard work if I remember. Her wound on the medial ankle is smaller. Some debris on this we have been using Hydrofera Blue under Unna boots 5/7; mid tibia. She has been using Hydrofera Blue under an Unna wrap. She is apparently going for her ankle surgery on June 3 10/28/19-Patient returns to clinic with the ankle wound, we are using Hydrofera Blue under Unna wrap, surgery is scheduled for her left foot for June 3 so she will be back for nurse visit next week READMISSION 01/17/2020 Mrs. Danser is a 65 year old woman we have had in this clinic for a long period of time with severe venous hypertension and refractory wounds on her medial lower legs and ankles bilaterally. 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 When she was off her legs for a prolonged period example when she fell and suffered a compression fracture things would heal up quite nicely. She is now retired and we managed to heal up the right medial leg wound. The left one was very tiny  last time I saw this although still refractory. 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 It got to the point where this was interfering with her walking and she elected to proceed with a ankle arthrodesis to straighten her her ankle and leave her with a functional outcome for mobilization. The patient was referred to Dr. Doren Custard and really this took some time to arrange. Dr. Doren Custard saw her on 12/07/2019. Once again he verified that she had no arterial issues. She had previously had an angiogram several years ago. Follow-up ABIs on the left showed an ABI of 1.12 with triphasic waveforms and a TBI of 0.92. She is felt to have chronic deep venous insufficiency but I do  not think it was felt that anything could be done from about this from an ablation point of view. At the time Dr. Doren Custard saw this patient the wounds actually look closed via the pictures in his clinic. The patient finally underwent her surgery on 12/15/2019. This went reasonably well and there was a good anatomic outcome. She developed a small distal wound dehiscence on the lateral part of the surgical wound. However more problematically she is developed recurrence of the wound on the medial left ankle. There are actually 2 wounds here one in the distal lower leg and 1 pretty much at the level of the medial malleolus. It is a more distal area that is more problematic. She has been using Hydrofera Blue which started on Friday before this she was simply Ace wrapping. There was a culture done that showed Pseudomonas and she is on ciprofloxacin. A recent CNS on 8/11 was negative. The patient reports some pain but I generally think this is improving. She is using a cam boot completely nonweightbearing using a walker for pivot transfers and a wheelchair 8/24; not much improvement unfortunately she has a surgical wound on the lateral part in the venous insufficiency wound medially. The bottom part of the medial insufficiency wound is still necrotic there is exposed tendon here. We have been using Hydrofera Blue under compression. Her edema control is however better 8/31; patient in for follow-up of his surgical wound on the lateral part of her left leg and chronic venous insufficiency ulcers medially. We put her back in compression last week. She comes in today with a complaint of 3 or 4 days worth of increasing pain. She felt her cam walker was rubbing on the area on the back of her heel. However there is intense erythema seems more likely she has cellulitis. She had 2 cultures done when she was seeing podiatry in the postop. One of them in late July showed Pseudomonas and she received a course of  ciprofloxacin the other was negative on 8/11 she is allergic to penicillin with anaphylactoid complaints of hives oral swelling via information in epic 9/9; when I saw this patient last week she had intense anterior erythema around her wound on the right lateral heel and ankle and also into the right medial heel. Some of this was no doubt drainage and her walker boot however I was convinced she had cellulitis. I gave her Levaquin and Bactrim she is finishing up on this now. She is following up with Dr. [name] he saw her yesterday. He is taken her out of the walking boot of course she is still nonweightbearing. Her x-ray was negative for any worrisome features such as soft tissue air etc. Things are a  lot better this week. She has home health. We have been using Hydrofera Blue under an [employer] which she put back on yesterday. I did not wrap her last week 9/17; her surrounding skin looks a lot better. In fact the area on the left lateral ankle has just a scant amount of eschar. The only remaining wound is the large area on the left medial ankle. Probably about 60% of this is healthy granulation at the surface however she has a significant divot distally. This has adherent debris in it. I been using debridement and silver collagen to try and get this area to fill-in although I do not think we have made much progress this week 9/24; the patient's wound on the left medial ankle looks a lot better. The deeper divot area distally still requires debridement but this is cleaning up quite nicely we have been using silver collagen. The patient is complaining of swelling in her foot and is worried that that is contributing to the nonhealing of the ankle wound. She is also complaining of numbness in her anterior toes 10/4; left medial ankle. The small area distally still has a divot with necrotic material that I have been debriding away. This has an undermining area. She is approved for Apligraf. She saw Dr.  [name] her surgeon on 10/1. I think he declared himself is satisfied with the condition of things. Still nonweightbearing till the end of the month. 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 Her surgical wound is well closed. There is no evidence of infection 10/11; the patient arrived in clinic today with the expectation that we be able to put an Apligraf on this area after debridement however she arrives with a relatively large amount of green drainage on the dressing. The patient states that this started on Friday. She has not been systemically unwell. 10/19; culture I did last week showed both Enterococcus and Pseudomonas. I think this came in separate parts because I stopped her ciprofloxacin I gave her and prescribed her linezolid however now looking at the final culture result this is Pseudomonas which is resistant to quinolones. She has not yet picked up the linezolid apparently phone issues. We are also trying to get a topical antibiotic out of [place] in [place] they can be applied by home health. She is still having green drainage 10/16; the patient has her topical antibiotic from [facility] in [place]. This is a compounded gel with vancomycin and ciprofloxacin and gentamicin. We are applying this on the wound bed with silver alginate over the top with Unna boot wraps. She arrives in clinic today with a lot less ominous looking drainage although she is only use this topical preparation once the second time today. She sees Dr. [name] her surgeon on Friday she has home health changing the dressing 11/2; still using her compounded topical antibiotic under silver alginate. Surface is cleaning up there is less drainage. We had an Apligraf for her today and I elected to apply it. A light coating of her antibiotic 04/25/2020 upon evaluation today patient appears to be doing well with regard to her ankle ulcer. There is a little bit of slough noted on  the surface of the wound I am can have to perform sharp debridement to clear this away today. With that being said other than that fact overall I feel like she is making progress and we do see some new epithelial growth. There is also some improvement in the depth  of the wound and that distal portion. There is little bit of slough there as well. 12/7; 2-week follow-up. Apligraf #3. Dimensions are smaller. Closing in especially inferiorly. Still some surface debris. Still using the [facility] topical antibiotic but I told her that I don't think this needs to be renewed 12/21; 2-week follow-up. Apligraf #4 dimensions are smaller. Nice improvement 06/05/2020; 2-week follow-up. The patient's wound on the left medial ankle looks really excellent. Nice granulation. Advancing epithelialization no undermining no evidence of infection. We would have to reapply for another Apligraf but with the condition of this wound I did not feel strongly about it. We used Hydrofera Blue under the same degree of compression. She follows up with Dr. [name] her surgeon a week Friday 06/13/2020 upon evaluation today patient appears to be doing excellent in regard to her wound. She has been tolerating the dressing changes without complication. Fortunately there is no signs of active infection at this time. No fevers, chills, nausea, vomiting, or diarrhea. She was using Hydrofera Blue last week. 06/20/2020 06/20/2020 on evaluation today patient actually appears to be doing excellent in regard to her wound. This is measuring better and looking much better as well. She has been using the collagen that seems to be doing better for her as well even though the [facility] was and is not sticking or feeling as rough on her wound. She did see Dr. [name] on Friday he is very pleased he also stated none of the hardware has shifted. That is great news 1/27; the patient has a small clean wound all that is remaining. I agree that this is too  small to really consider an Apligraf. Under illumination the surface is looking quite good. We have been using collagen although the dimensions are not any better this week 2/2; the patient has a small clean wound on the left medial ankle. Although this left of her substantial original areas. Measurements are smaller. We have been using polymen Ag under an Haematologist. 2/10; small area on the left medial ankle. This looks clean nothing to debride however dimensions are about the same we have been using polymen I think now for 2 weeks 2/17; not much change in surface area. We have been using polymen Ag without any improvement. 3/17; 1 month follow-up. The patient has been using endoform without any improvement in fact I think this looks worse with more depth and more expansion 3/24; no improvement. Perhaps less debris on the surface. We have been using Sorbact for 1 week 4/4; wound measures larger. She has edema in her leg and her foot which she tells as her wrap came down. We have been using Unna boots. Sorbact of the wound. She has been approved for Apligraf 09/12/2020 upon evaluation today patient appears to be doing well with regard to her wound. We did get the Apligraf reapproved which is great news we have that available for application today. Fortunately there is no signs of infection and overall the patient seems to be doing great. The wound bed is nice and clean. 4/27; patient presents for her second application of Apligraf. She states over the past week she has been on her feet more often due to being outside in her garden. She has noted more swelling to her foot as a result. She denies increased warmth, pain or erythema to the wound site. 10/10/2020 upon evaluation today patient appears to be doing well with regard to her wound which does not appear to be quite as irritated  as last week from what I am hearing. With that being said unfortunately she is having issues with some erythema and warmth  to touch as well as an increase size. I do believe this likely is infected. 10/17/2020 upon evaluation today patient appears to be doing excellent in regard to her wound this is significantly improved as compared to last week. Fortunately I think that the infection is much better controlled at this point. She did have evidence of both Enterococcus as well as Staphylococcus noted on culture. Enterococcus really would not be helped significantly by the Cipro but the wound is doing so much better I am under the assumption that the Staphylococcus is probably the main organism that is causing the current infection. Nonetheless I think that she is doing excellent as far as that is concerned and I am very pleased in that regard. I would therefore recommend she continue with the Cipro. 10/31/2020 upon evaluation today patient appears to be doing well with regard to her wound. She has been tolerating the dressing changes without complication. Fortunately there is no signs of active infection and overall I am extremely pleased with where things stand today. No fevers, chills, nausea, vomiting, or diarrhea. With that being said she does have some green drainage coming from the wound and although it looks okay I am a little concerned about the possibility of a continuing infection. Specifically with Pseudomonas. For that reason I will go ahead and send in a prescription for Cipro for her to be continued. 11/14/2020 upon evaluation today patient appears to be doing very well currently in regard to her wound on her leg. She has been tolerating the dressing changes without complication. Fortunately I feel like the infection is finally under good control here. Unfortunately we do not have the Apligraf for application today although we can definitely order to have it in place for next week. That will be her fifth and final of the current series. Nonetheless I feel like her wound is really doing quite well which is great  news. 11/21/2020 upon evaluation today patient appears to be doing well with regard to her wound on the medial ankle. Fortunately I think the infection is under control and I do believe we can go ahead and reapply the Apligraf today. She is in agreement with that plan. There does not appear to be any signs of active infection at this time which is great news. No fevers, chills, nausea, vomiting, or diarrhea. 12/05/2020 upon evaluation today patient's wound bed actually showed signs of good granulation epithelization at this point. There does not appear to be any signs of infection which is great news and overall very pleased with where things stand. Overall the patient seems to be doing fairly well in my opinion with regard to her wound although I do believe she continues to build up a lot of biofilm I think she could benefit from using PuraPly at this point. 12/12/2020 upon evaluation today patient's wound actually appears to be doing decently well today. The Unna boot has not been quite as well-tolerated so that more uncomfortable for her and even causing some pressure over the plate on the lateral portion of her foot which is 90 where the wound is. There did not appear to be any significant deep tissue injury with that there may be a minimal change in the skin noted I think that we may want to go back to the Coflex 2 layer which is a little bit easier on her skin  it seems. 12/19/2020 upon evaluation today patient actually seems to be making great progress with the PuraPly currently. She in fact seems to be much better as far as the overall appearance of the wound bed is concerned I am very happy in this regard. I do not see any signs of of infection which is great news as well. No fevers, chills, nausea, vomiting, or diarrhea. 12/26/2020 upon evaluation today patient appears to actually be doing better in regard to her wound on the left medial ankle region. The surface of the wound is actually doing  significantly better which is great news. There does not appear to be any signs of infection which is also great news and in general I am extremely pleased with where we stand today. 01/02/2021 upon evaluation today patient appears to be doing well with regard to her wound. In fact this is showing signs of excellent improvement and very pleased with where things stand. In fact the last 3 appointments have all shown signs of this getting smaller which is excellent news. I have not even had to perform any debridement and today is no exception. Overall I feel like this is dramatically improved compared to previous. T oday is PuraPly application #4. 9/83/3825 upon evaluation today patient appears to be doing excellent in regard to her wound this is continue to show signs of improvement and overall I am extremely pleased with where we stand today. She is actually here for PuraPly application #5. Every time we have applied this she is noted definite improvement on measurements. 01/23/2021 upon evaluation today patient is actually making good progress in regard to her wound. This was actually on just a little bit longer this time compared to previous due to the fact that she did have to go out of town. She is actually here for PuraPly application #6. We have definitely been seeing improvements in the overall quality of the tissue on the surface of the wound which is awesome news. In general I think that the patient seems to be continuing to make great progress here. 01/30/2021 upon evaluation today patient's wound is actually doing excellent. There is really not any significant biofilm buildup which is great news and overall I am extremely pleased with where things stand today. There does not appear to be any signs of active infection. No fevers, chills, nausea, vomiting, or diarrhea. 02/06/2021 upon evaluation today patient's wound is actually showing signs of excellent improvement which is great news. We  continue to see the benefit of the PuraPly this is doing a great job the wound seems not really irritated whatsoever and is showing signs of good granulation at this point. Overall I am extremely happy with what we are seeing. The patient likewise is happy to hear all of this as well. 02/13/2021 upon evaluation today patient appears to be doing well with regard to her wound. This again is measuring smaller today and I am very pleased in this regard. Fortunately there does not appear to be any signs of active infection at this time which is good news from a systemic standpoint. Locally there is still some significant drainage which she does have is concerned about infection locally. No fevers, chills, nausea, vomiting, or diarrhea. 02/20/2021 upon evaluation today patient actually appears to be doing decently well in regard to her wound. She has been tolerating the dressing changes without complication. I do believe the PuraPly is helping wound bed does appear to be doing better. There is no evidence of active  infection locally or systemically at this point visually although on fluorescence imaging there still appear to be bacterial load and bioburden noted. 02/27/2021 upon evaluation today patient fortunately continues to show signs of improvement with use of the PuraPly currently. Subsequently we did review her culture results and to be honest I think that the Bactrim is probably the best option to have her continue at this point. For that reason I am get a go ahead and send in a refill today for her for an additional 2 weeks. Nonetheless I think that we are making excellent progress here. It was Enterococcus and Staphylococcus that were noted she is allergic to penicillin so there is not much I can do from the Enterococcus standpoint though the staff does seem to be sensitive to the Bactrim. 03/06/2021 upon evaluation today patient appears to be doing well with regard to her wound. This is definitely  showing signs of improvement which is great news and overall I am extremely pleased with where things stand today. There does not appear to be any signs of active infection which is great news and overall and I do believe that we are headed in the appropriate direction I think the PuraPly is doing an awesome job for her. 03/20/2021 upon evaluation today patient actually appears to be doing well with regard to her wound. We have been using the PuraPly although last week when I was out of the office they actually just used endoform nonetheless this still seems to be doing great. Fortunately there does not appear to be any signs of active infection which is great news overall I think the patient is making wonderful progress. 03/27/2021 upon evaluation today patient appears to be doing well with regard to her wound in fact there is a little drainage but otherwise her does not appear to be any major signs of open wounds nor infection at this time. Overall I am extremely pleased with where things stand. No fevers, chills, nausea, vomiting, or diarrhea. 04/03/2021 upon evaluation today patient presents for reevaluation here in the clinic and overall she seems to be doing quite well. Fortunately there does not appear to be any signs of active infection which is great news and in general I am very pleased with how things appear. There is still a very small opening remaining but in general she is looking much better. Fortunately I do not think there is any need for the PuraPly at this point. 04/10/2021 upon evaluation today patient's wound is actually showing signs of excellent improvement. I am actually very pleased with where it stands today. I think she is very close to complete closure in fact this may be healed today. Either way I think that she may benefit from using her own compression socks for the next week and then will subsequently see how things stand following. 04/17/2021 upon evaluation today  patient appears to be doing well with regard to her wound on the ankle region. This is actually showing signs of excellent improvement which is great news. I do not see any signs of active infection at this time which is great and no open wound. With that being said on the lateral portion of her ankle right at the base of her plate she actually has an area of pressure that occurred over the past week when she was not in the wrap or we been padding her. This does seem to be an issue which I think is good to be an ongoing problem I am can  actually get in touch with Dr. [name] today to see what we can do in that regard. 04/24/2021 upon evaluation today patient appears to be doing well with regard to her ankle region for the most part although there does appear to be some fluid trapped underneath the area where we thought this was healed last week. Unfortunately I think at this time that we can have to reopen and clean away some of this area and then subsequently depending on how things do over the next week or so we will see about discharge but right now I do not think that is the right way to go. She voiced understanding. Objective Constitutional Well-nourished and well-hydrated in no acute distress. Vitals Time Taken: 10:10 AM, Height: 68 in, Temperature: 98.2 F, Pulse: 59 bpm, Respiratory Rate: 20 breaths/min, Blood Pressure: 115/71 mmHg. Respiratory normal breathing without difficulty. Psychiatric this patient is able to make decisions and demonstrates good insight into disease process. Alert and Oriented x 3. pleasant and cooperative. General Notes: Upon inspection patient's wound bed actually showed signs of descent granulation epithelization though she did have some drainage overlying what appeared to be a trapped area of fluid in the center part of the wound that has been the more concerned I did have to clear that away today. She tolerated that without complication and postdebridement the  wound bed appears to be doing significantly better which is great news. Integumentary (Hair, Skin) Wound #15R status is Open. Original cause of wound was Gradually Appeared. The date acquired was: 12/30/2019. The wound has been in treatment 66 weeks. The wound is located on the Left,Medial Malleolus. The wound measures 0.1cm length x 0.1cm width x 0.1cm depth; 0.008cm^2 area and 0.001cm^3 volume. There is Fat Layer (Subcutaneous Tissue) exposed. There is no tunneling or undermining noted. There is a medium amount of serosanguineous drainage noted. The wound margin is distinct with the outline attached to the wound base. There is large (67-100%) red, pink granulation within the wound bed. There is no necrotic tissue within the wound bed. 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 There was a Selective/Open Wound Skin/Epidermis Debridement with a total area of 0.01 sq cm performed by [name], PA. With the following instrument(s): Curette Material removed includes Callus, Skin: Dermis, and Skin: Epidermis after achieving pain control using Lidocaine. No specimens were taken. A time out was conducted at 10:53, prior to the start of the procedure. A Minimum amount of bleeding was controlled with Pressure. The procedure was tolerated well with a pain level of 0 throughout and a pain level of 0 following the procedure. Post Debridement Measurements: 0.1cm length x 0.1cm width x 0.1cm depth; 0.001cm^3 volume. Character of Wound/Ulcer Post Debridement is improved. Severity of Tissue  Post Debridement is: Fat layer exposed. Post procedure Diagnosis Wound #15R: Same as Pre-Procedure Plan Follow-up Appointments: Return Appointment in 1 week. - with Ismael Treptow Edema Control - Lymphedema / SCD / Other: Patient to wear own compression stockings every day. - May use an ace wrap over the holiday long weekend Non Wound Condition: Apply the following to affected area as directed: - Apply silicone bordered foam to  left lateral foot for protection until your f/u with Dr. [name] WOUND #15R: - Malleolus Wound Laterality: Left, Medial Cleanser: Wound Cleanser (DME) (Generic) 1 x Per Day/15 Days Discharge Instructions: Cleanse the wound with wound cleanser prior to applying a clean dressing using gauze sponges, not tissue or cotton balls. Prim Dressing: Maxorb Extra Calcium Alginate 2x2 in (DME) (Generic) 1 x Per Day/15 Days ary Discharge Instructions: Apply calcium alginate to wound bed as instructed Secondary Dressing: Woven Gauze Sponge, Non-Sterile 4x4 in (DME) (Generic) 1 x Per Day/15 Days Discharge Instructions: Apply over primary dressing as directed. Secondary Dressing: Zetuvit Plus Silicone Border Dressing 4x4 (in/in) (DME) (Generic) 1 x Per Day/15 Days Discharge Instructions: Apply silicone border over primary dressing as directed. Com pression Wrap: 33M ACE Elastic Bandage With VELCRO Brand Closure, 4 (in) (DME) (Generic) 1 x Per Day/15 Days Discharge Instructions: may use instead of compression stocking for this week 1. Would recommend currently that we going continue with the use of a plain alginate dressing followed by bordered foam dressing over this area I do think that is good to do well for her. 2. With regard to the lateral part of her foot fortunately the border foam dressing has kept this under control and from breaking down and in fact looks much better and very pleased in that regard. She still waiting hear back from Dr. [name] office about what to do next year. 3. I am  also can recommend the patient continue to monitor for any signs of infection if anything changes she should let me know. We will see patient back for reevaluation in 1 week here in the clinic. If anything worsens or changes patient will contact our office for additional recommendations. Electronic Signature(s) Signed: 04/24/2021 11:51:56 AM By: [name] PA-C Entered By: [name] on 04/24/2021 11:51:56 -------------------------------------------------------------------------------- SuperBill Details Patient Name: Date of Service: [facility] [name], [name] 04/24/2021 Medical Record Number: [id number] Patient Account Number: [id number] Date of Birth/Sex: Treating RN: 09/08/1955 (65 y.o. [name], [name] Primary Care Provider: [name] Other Clinician: Referring Provider: Treating Provider/Extender: [name] in Treatment: 66 Diagnosis Coding ICD-10 Codes Code Description [phone] Chronic venous hypertension (idiopathic) with ulcer and inflammation of left lower extremity L97.828 Non-pressure chronic ulcer of other part of left lower leg with other specified severity L97.328 Non-pressure chronic ulcer of left ankle with other specified severity Facility Procedures CPT4 Code: [id number] Description: 84696 - DEBRIDE WOUND 1ST 20 SQ CM OR < ICD-10 Diagnosis Description L97.328 Non-pressure chronic ulcer of left ankle with other specified severity Modifier: Quantity: 1 Physician Procedures : CPT4 Code Description Modifier [id number] - WC PHYS DEBR WO ANESTH 20 SQ CM ICD-10 Diagnosis Description L97.328 Non-pressure chronic ulcer of left ankle with other specified severity Quantity: 1 Electronic Signature(s) Signed: 04/24/2021 11:56:19 AM By: [name] PA-C Entered By: [name] on 04/24/2021 11:56:19

## 2021-05-01 ENCOUNTER — Other Ambulatory Visit: Payer: Self-pay

## 2021-05-01 ENCOUNTER — Encounter (HOSPITAL_BASED_OUTPATIENT_CLINIC_OR_DEPARTMENT_OTHER): Payer: Medicare Other | Admitting: Physician Assistant

## 2021-05-01 DIAGNOSIS — I87332 Chronic venous hypertension (idiopathic) with ulcer and inflammation of left lower extremity: Secondary | ICD-10-CM | POA: Diagnosis not present

## 2021-05-01 NOTE — Progress Notes (Signed)
[name], EBRAHIM ([id number]) Visit Report for 05/01/2021 Arrival Information Details Patient Name: Date of Service: [facility] [name], [name] 05/01/2021 10:00 A M Medical Record Number: [id number] Patient Account Number: [id number] Date of Birth/Sex: Treating RN: Feb 22, 1956 (65 y.o. [name] Laylani Pudwill: [name] Other Clinician: Referring Janean Eischen: Treating Jovanne Riggenbach/Extender: [name] in Treatment: 14 Visit Information History Since Last Visit Added or deleted any medications: No Patient Arrived: Cane Any new allergies or adverse reactions: No Arrival Time: 10:36 Had a fall or experienced change in No Accompanied By: self activities of daily living that may affect Transfer Assistance: None risk of falls: Patient Identification Verified: Yes Signs or symptoms of abuse/neglect since last visito No Secondary Verification Process Completed: Yes Hospitalized since last visit: No Patient Requires Transmission-Based Precautions: No Implantable device outside of the clinic excluding No Patient Has Alerts: Yes cellular tissue based products placed in the center Patient Alerts: L ABI =1.12, TBI = .92 since last visit: R ABI= 1.02, TBI= .58 Has Dressing in Place as Prescribed: Yes Has Compression in Place as Prescribed: Yes Pain Present Now: Yes Electronic Signature(s) Signed: 05/01/2021 4:52:58 PM By: Baruch Gouty RN, BSN Entered By: Baruch Gouty on 05/01/2021 10:37:47 -------------------------------------------------------------------------------- Compression Therapy Details Patient Name: Date of Service: [name], [name]. 05/01/2021 10:00 A M Medical Record Number: [id number] Patient Account Number: [id number] Date of Birth/Sex: Treating RN: 07-Apr-1956 (65 y.o. [name] Milley Vining: [name] Other Clinician: Referring Desarai Barrack: Treating Lebron Nauert/Extender: [name] in  Treatment: 36 Compression Therapy Performed for Wound Assessment: Wound #15R Left,Medial Malleolus Performed By: Clinician Baruch Gouty, RN Compression Type: Double Layer Post Procedure Diagnosis Same as Pre-procedure Electronic Signature(s) Signed: 05/01/2021 4:52:58 PM By: Baruch Gouty RN, BSN Entered By: Baruch Gouty on 05/01/2021 11:19:05 -------------------------------------------------------------------------------- Encounter Discharge Information Details Patient Name: Date of Service: [name], [name]. 05/01/2021 10:00 A M Medical Record Number: [id number] Patient Account Number: [id number] Date of Birth/Sex: Treating RN: [birth year] (65 y.o. [name] Karess Harner: [name] Other Clinician: Referring Ender Rorke: Treating Sarrah Fiorenza/Extender: [name] in Treatment: 47 Encounter Discharge Information Items Discharge Condition: Stable Ambulatory Status: Cane Discharge Destination: Home Transportation: Private Auto Accompanied By: self Schedule Follow-up Appointment: Yes Clinical Summary of Care: Patient Declined Electronic Signature(s) Signed: 05/01/2021 4:52:58 PM By: Baruch Gouty RN, BSN Entered By: Baruch Gouty on 05/01/2021 11:22:50 -------------------------------------------------------------------------------- Lower Extremity Assessment Details Patient Name: Date of Service: [facility] [name], [name]. 05/01/2021 10:00 A M Medical Record Number: [id number] Patient Account Number: [id number] Date of Birth/Sex: Treating RN: 09-30-1955 (65 y.o. [name] Shelby Peltz: [name] Other Clinician: Referring Laquinta Hazell: Treating Lylla Eifler/Extender: [name] in Treatment: 28 Edema Assessment Assessed: [Left: No] [Right: No] Edema: [Left: N] [Right: o] Calf Left: Right: Point of Measurement: 31 cm From Medial Instep 27.7 cm Ankle Left: Right: Point of  Measurement: 11 cm From Medial Instep 20.8 cm Vascular Assessment Pulses: Dorsalis Pedis Palpable: [Left:Yes] Electronic Signature(s) Signed: 05/01/2021 4:52:58 PM By: Baruch Gouty RN, BSN Entered By: Baruch Gouty on 05/01/2021 10:42:26 -------------------------------------------------------------------------------- Bruin Details Patient Name: Date of Service: [name], [name]. 05/01/2021 10:00 A M Medical Record Number: [id number] Patient Account Number: [id number] Date of Birth/Sex: Treating RN: Feb 17, 1956 (65 y.o. [name] Breyon Blass: [name] Other Clinician: Referring Yandriel Boening: Treating Roux Brandy/Extender: Clementeen Graham,  Noelle Weeks in Treatment: 89 Multidisciplinary Care Plan reviewed with physician Active Inactive Venous Leg Ulcer Nursing Diagnoses: Actual venous Insuffiency (use after diagnosis is confirmed) Knowledge deficit related to disease process and management Goals: Patient will maintain optimal edema control Date Initiated: 06/20/2020 Target Resolution Date: 05/29/2021 Goal Status: Active Interventions: Assess peripheral edema status every visit. Compression as ordered Treatment Activities: Therapeutic compression applied : 06/20/2020 Notes: 01/30/21: Edema control ongoing, using CoFlex wrap. Wound/Skin Impairment Nursing Diagnoses: Impaired tissue integrity Knowledge deficit related to ulceration/compromised skin integrity Goals: Patient/caregiver will verbalize understanding of skin care regimen Date Initiated: 01/17/2020 Target Resolution Date: 05/29/2021 Goal Status: Active Ulcer/skin breakdown will have a volume reduction of 30% by week 4 Date Initiated: 01/17/2020 Date Inactivated: 03/12/2020 Target Resolution Date: 03/09/2020 Goal Status: Met Interventions: Assess patient/caregiver ability to obtain necessary supplies Assess patient/caregiver ability to perform ulcer/skin care  regimen upon admission and as needed Assess ulceration(s) every visit Provide education on ulcer and skin care Notes: 01/30/21: Wound care regimen continues, PuraPly skin sub being used. 04/17/21: Wound healed Electronic Signature(s) Signed: 05/01/2021 4:52:58 PM By: Baruch Gouty RN, BSN Entered By: Baruch Gouty on 05/01/2021 10:48:35 -------------------------------------------------------------------------------- Pain Assessment Details Patient Name: Date of Service: [name], [name]. 05/01/2021 10:00 A M Medical Record Number: [id number] Patient Account Number: [id number] Date of Birth/Sex: Treating RN: [birth year] (65 y.o. [name] Caine Barfield: [name] Other Clinician: Referring Levii Hairfield: Treating Ketra Duchesne/Extender: [name] in Treatment: 18 Active Problems Location of Pain Severity and Description of Pain Patient Has Paino Yes Site Locations Pain Location: Pain in Ulcers With Dressing Change: Yes Duration of the Pain. Constant / Intermittento Intermittent Rate the pain. Current Pain Level: 0 Worst Pain Level: 5 Character of Pain Describe the Pain: Sharp, Shooting Pain Management and Medication Current Pain Management: Medication: Yes Is the Current Pain Management Adequate: Adequate How does your wound impact your activities of daily livingo Sleep: No Bathing: No Appetite: No Relationship With Others: No Bladder Continence: No Emotions: No Bowel Continence: No Work: No Toileting: No Drive: No Dressing: No Hobbies: No Engineer, maintenance) Signed: 05/01/2021 4:52:58 PM By: Baruch Gouty RN, BSN Entered By: Baruch Gouty on 05/01/2021 10:38:39 -------------------------------------------------------------------------------- Patient/Caregiver Education Details Patient Name: Date of Service: [name], [name] 11/30/2022andnbsp10:00 A M Medical Record Number: [id number] Patient Account  Number: [id number] Date of Birth/Gender: Treating RN: [birth year] (65 y.o. [name] Physician: [name] Other Clinician: Referring Physician: Treating Physician/Extender: [name] in Treatment: 45 Education Assessment Education Provided To: Patient Education Topics Provided Venous: Methods: Explain/Verbal Responses: Reinforcements needed, State content correctly Wound/Skin Impairment: Methods: Explain/Verbal Responses: Reinforcements needed, State content correctly Electronic Signature(s) Signed: 05/01/2021 4:52:58 PM By: Baruch Gouty RN, BSN Entered By: Baruch Gouty on 05/01/2021 10:49:02 -------------------------------------------------------------------------------- Wound Assessment Details Patient Name: Date of Service: [name], [name]. 05/01/2021 10:00 A M Medical Record Number: [id number] Patient Account Number: [id number] Date of Birth/Sex: Treating RN: 1956/01/30 (65 y.o. [name] Krizia Flight: [name] Other Clinician: Referring Lisabeth Mian: Treating Philamena Kramar/Extender: [name] in Treatment: 7 Wound Status Wound Number: 15R Primary Venous Leg Ulcer Etiology: Wound Location: Left, Medial Malleolus Wound Status: Open Wounding Event: Gradually Appeared Comorbid Peripheral Venous Disease, Osteoarthritis, Confinement Date Acquired: 12/30/2019 History: Anxiety Weeks Of Treatment: 67 Clustered Wound: No Photos Wound Measurements Length: (cm) 0.7 Width: (cm) 0.6 Depth: (cm) 0.1 Area: (cm) 0.33 Volume: (cm)  0.033 % Reduction in Area: 97.2% % Reduction in Volume: 99.6% Epithelialization: Medium (34-66%) Tunneling: No Undermining: No Wound Description Classification: Full Thickness With Exposed Support Structures Wound Margin: Indistinct, nonvisible Exudate Amount: Medium Exudate Type: Serous Exudate Color: amber Foul Odor After Cleansing:  No Slough/Fibrino No Wound Bed Granulation Amount: Large (67-100%) Exposed Structure Granulation Quality: Pink Fascia Exposed: No Necrotic Amount: None Present (0%) Fat Layer (Subcutaneous Tissue) Exposed: Yes Tendon Exposed: No Muscle Exposed: No Joint Exposed: No Bone Exposed: No Treatment Notes Wound #15R (Malleolus) Wound Laterality: Left, Medial Cleanser Wound Cleanser Discharge Instruction: Cleanse the wound with wound cleanser prior to applying a clean dressing using gauze sponges, not tissue or cotton balls. Peri-Wound Care Topical Primary Dressing Maxorb Extra Calcium Alginate 2x2 in Discharge Instruction: Apply calcium alginate to wound bed as instructed Secondary Dressing Woven Gauze Sponge, Non-Sterile 4x4 in Discharge Instruction: Apply over primary dressing as directed. Secured With Compression Wrap CoFlex TLC XL 2-layer Compression System 4x7 (in/yd) Discharge Instruction: Apply CoFlex 2-layer compression as directed. (alt for 4 layer) Compression Stockings Add-Ons Electronic Signature(s) Signed: 05/01/2021 4:52:58 PM By: Baruch Gouty RN, BSN Entered By: Baruch Gouty on 05/01/2021 10:47:13 -------------------------------------------------------------------------------- Vitals Details Patient Name: Date of Service: [name], [name]. 05/01/2021 10:00 A M Medical Record Number: [id number] Patient Account Number: [id number] Date of Birth/Sex: Treating RN: 10/20/1955 (65 y.o. [name] Tayleigh Wetherell: [name] Other Clinician: Referring Lynden Carrithers: Treating Kaelie Henigan/Extender: [name] in Treatment: 59 Vital Signs Time Taken: 10:40 Temperature (F): 98.1 Height (in): 68 Pulse (bpm): 61 Source: Stated Respiratory Rate (breaths/min): 18 Source: Stated Blood Pressure (mmHg): 101/58 Reference Range: 80 - 120 mg / dl Electronic Signature(s) Signed: 05/01/2021 4:52:58 PM By: Baruch Gouty RN,  BSN Entered By: Baruch Gouty on 05/01/2021 10:40:42

## 2021-05-01 NOTE — Progress Notes (Addendum)
ARICELA, BERTAGNOLLI ([id number]) Visit Report for 05/01/2021 Chief Complaint Document Details Patient Name: Date of Service: [facility] [name], [name] 05/01/2021 10:00 A M Medical Record Number: [id number] Patient Account Number: [id number] Date of Birth/Sex: Treating RN: 1956/01/07 (65 y.o. [name] Provider: [name] Other Clinician: Referring Provider: Treating Provider/Extender: [name] in Treatment: 64 Information Obtained from: Patient Chief Complaint patient is been followed long-term in this clinic for venous insufficiency ulcers with inflammation, hypertension and ulceration over the medial ankle bilaterally. 01/17/2020; this is a patient who is here for review of postoperative wounds on the left lateral ankle and recurrence of venous stasis ulceration on the left medial Electronic Signature(s) Signed: 05/01/2021 10:01:07 AM By: [name] PA-C Entered By: [name] on 05/01/2021 10:01:06 -------------------------------------------------------------------------------- HPI Details Patient Name: Date of Service: [name], [name] 05/01/2021 10:00 A M Medical Record Number: [id number] Patient Account Number: [id number] Date of Birth/Sex: Treating RN: 16-Feb-1956 (65 y.o. [name] Provider: [name] Other Clinician: Referring Provider: Treating Provider/Extender: [name] in Treatment: 46 History of Present Illness HPI Description: the remaining wound is over the left medial ankle. Similar wound over the right medial ankle healed largely with use of Apligraf. Most recently we have been using Hydrofera Blue over this wound with considerable improvement. The patient has been extensively worked up in the past for her venous insufficiency and she is not a candidate for antireflux surgery although I have none of the details available currently. 08/24/14; considerable  improvement today. About 50% of this wound areas now epithelialized. The base of the wound appears to be healthier granulation.as opposed to last week when she had deteriorated a considerable improvement 08/17/14; unfortunately the wound has regressed somewhat. The areas of epithelialization from the superior aspect are not nearly as healthy as they were last week. The patient thinks her Hydrofera Blue slipped. 09/07/14; unfortunately the area has markedly regressed in the 2 weeks since I've seen this. There is an odor surrounding erythema. The healthy granulation tissue that we had at the base of the wound now is a dusky color. The nurse reports green drainage 09/14/14; the area looks somewhat better than last week. There is less erythema and less drainage. The culture I did did not show any growth. Nevertheless I think it is better to continue the Cipro and doxycycline for a further week. The remaining wound area was debridement. 09/21/14. Wound did not require debridement last week. Still less erythema and less drainage. She can complete her antibiotics. The areas of epithelialization in the superior aspect of the wound do not look as healthy as they did some weeks ago 10/05/14 continued improvement in the condition of this wound. There is advancing epithelialization. Less aggressive debridement required 10/19/14 continued improvement in the condition and volume of this wound. Less aggressive debridement to the inferior part of this to remove surface slough and fibrinous eschar 11/02/14 no debridement is required. The surface granulation appears healthy although some of her islands of epithelialization seem to have regressed. No evidence of infection 11/16/14; lites surface debridement done of surface eschar. The wound does not look to be unhealthy. No evidence of infection. Unfortunately the patient has had podiatry issues in the right foot and for some reason has redeveloped small surface ulcerations in  the medial right ankle. Her original presentation involved wounds in this area 11/23/14 no debridement. The area on the  right ankle has enlarged. The left ankle wound appears stable in terms of the surface although there is periwound inflammation. There has been regression in the amount of new skin 11/30/14 no debridement. Both wound areas appear healthy. There was no evidence of infection. The the new area on the right medial ankle has enlarged although that both the surfaces appear to be stable. 12/07/14; Debridement of the right medial ankle wound. No no debridement was done on the left. 12/14/14 no major change in and now bilateral medial ankle wounds. Both of these are very painful but the no overt evidence of infection. She has had previous venous ablation 12/21/14; patient states that her right medial ankle wound is considerably more painful last week than usual. Her left is also somewhat painful. She could not tolerate debridement. The right medial ankle wound has fibrinous surface eschar 12/28/14 this is a patient with severe bilateral venous insufficiency ulcers. For a considerable period of time we actually had the one on the right medial ankle healed however this recently opened up again in June. The left medial ankle wound has been a refractory area with some absent flows. We had some success with Hydrofera Blue on this area and it literally closed by 50% however it is recently opened up Foley. Both of these were debridement today of surface eschar. She tolerates this poorly 01/25/15: No change in the status of this. Thick adherent escar. Very poor tolerance of any attempt at debridement. I had healed the right medial malleolus wound for a considerable amount of time and had the left one down to about 50% of the volume although this is totally regressed over the last 48 weeks. Further the right leg has reopened. she is trying to make a appointment with pain and vascular, previous ablations with  Dr. Aleda Grana. I do not believe there is an arterial insufficiency issue here 02/01/15 the status of the adherent eschar bilaterally is actually improved. No debridement was done. She did not manage to get vascular studies done 02/08/15 continued debridement of the area was done today. The slough is less adherent and comes off with less pressure. There is no surrounding infection peripheral pulses are intact 02/15/15 selective debridement with a disposable curette. Again the slough is less adherent and comes off with less difficulty. No surrounding infection peripheral pulses are intact. 02/22/15 selective debridement of the right medial ankle wound. Slough comes off with less difficulty. No obvious surrounding infection peripheral pulses are intact I did not debridement the one on the left. Both of these are stable to improved 03/01/15 selective debridement of both wound areas using a curette to. Adherent slough cup soft with less difficulty. No obvious surrounding infection. The patient tells me that 2 days ago she noted a rash above the right leg wrap. She did not have this on her lower legs when she change this over she arrives with widespread left greater than right almost folliculitis-looking rash which is extremely pruritic. I don't see anything to culture here. There is no rash on the rest of her body. She feels well systemically. 03/08/15; selective debridement of both wounds using a curette. Base of this does not look unhealthy. She had limegreen drainage coming out of the left leg wound and describes a lot of drainage. The rash on her left leg looks improved to. No cultures were done. 03/22/15; patient was not here last week. Basal wounds does not look healthy and there is no surrounding erythema. No drainage. There is still a  rash on the left leg that almost looks vasculitic however it is clearly limited to the top of where the wrap would be. 04/05/15; on the right required a surgical  debridement of surface eschar and necrotic subcutaneous tissue. I did not debridement the area on the left. These continue to be large open wounds that are not changing that much. We were successful at one point in healing the area on the right, and at the same time the area on the left was roughly half the size of current measurements. I think a lot of the deterioration has to do with the prolonged time the patient is on her feet at work 04/19/15 I attempted-like surface debridement bilaterally she does not tolerate this. She tells me that she was in allergic care yesterday with extreme pain over her left lateral malleolus/ankle and was told that she has an "sprain" 05/03/15; large bilateral venous insufficiency wounds over the medial malleolus/medial aspect of her ankles. She complains of copious amounts of drainage and his usual large amounts of pain. There is some increasing erythema around the wound on the right extending into the medial aspect of her foot to. historically she came in with these wounds the right one healed and the left one came down to roughly half its current size however the right one is reopened and the left is expanded. 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 05/10/15 large bilateral venous insufficiency wounds. There is less adherence surface left however the surface culture that I did last week grew pseudomonas therefore bilateral selective debridement score necessary. There is surrounding erythema. The patient describes severe bilateral drainage and a lot of pain in the left ankle. Apparently her podiatrist was were ready to do a cortisone shot 05/17/15; the patient complains of pain and again copious amounts of drainage. 05/24/15; we used Iodo flex last week. Patient notes considerable improvement in wound drainage. Only needed to change this once. 05/31/15; we continued Iodoflex; the base of these large wounds  bilaterally is not too bad but there is probably likely a significant bioburden here. I would like to debridement just doesn't tolerate it. 06/06/14 I would like to continue the Iodoflex although she still hasn't managed to obtain supplies. She has bilateral medial malleoli or large wounds which are mostly superficial. Both of them are covered circumferentially with some nonviable fibrinous slough although she tolerates debridement very poorly. She apparently has an appointment for an ablation on the right leg by interventional radiology. 06/14/15; the patient arrives with the wounds and static condition. We attempted a debridement although she does not do well with this secondary to pain. I 07/05/15; wounds are not much smaller however there appears to be a cleaner granulating base. The left has tight fibrinous slough greater than the right. Debridement is tolerated poorly due to pain. Iodoflex is done more for these wounds in any of the multitude of different dressings I have tried on the left 1 and then subsequently the right. 07/12/15; no change in the condition of this wound. I am able to do an aggressive debridement on the right but not the left. She simply cannot tolerate it. We have been using Iodoflex which helps somewhat. It is worthwhile remembering that at one point we healed the right medial ankle wound and the left was about 25% of the current circumference. We have suggested returning to vascular surgery for review of possible further ablations for  one reason or another she has not been able to do this. 07/26/15 no major change in the condition of either wound on her medial ankle. I did not attempt to debridement of these. She has been aggressively scrubbing these while she is in the shower at home. She has her supply of Iodoflex which seems to have done more for these wounds then anything I have put on recently. 08/09/15 wound area appears larger although not verified by measurements. Using  Iodoflex 09/05/2015 -- she was here for avisit today but had significant problems with the wound and I was asked to see her for a physician opinion. I have summarize that this lady has had surgery on her left lower extremity about 10 years ago where the possible veins stripping was done. She has had an opinion from interventional radiology around November 2016 where no further sclerotherapy was ordered. The patient works 12 hours a day and stands on a concrete floor with work boots and is unable to get the proper compression she requires and cannot elevate her limbs appropriately at any given time. She has recently grown Pseudomonas from her wound culture but has not started her ciprofloxacin which was called in for her. 09/13/15 this continues to be a difficult situation for this patient. At one point I had this wound down to a 1.5 x 1.5" wound on her left leg. This is deteriorated and the right leg has reopened. She now has substantial wounds on her medial calcaneus, malleoli and into her lower leg. One on the left has surface eschar but these are far too painful for me to debridement here. She has a vascular surgery appointment next week to see if anything can be done to help here. I think she has had previous ablations several years ago at [facility]. She has no major edema. She tells me that she did not get product last time [facility] and went for several days without it. She continues to work in work boots 12 hours a day. She cannot get compression/4-layer under her work boots. 09/20/15 no major change. Periwound edema control was not very good. Her point with pain and vascular is next Wednesday the 25th 09/28/15; the patient is seen vascular surgery and is apparently scheduled for repeat duplex ultrasounds of her bilateral lower legs next week. 10/05/15; the patient was seen by Dr. Doren Custard of vascular surgery. He feels that she should have arterial insufficiency excluded as cause/contributed to  her nonhealing stage she is therefore booked for an arteriogram. She has apparently monophasic signals in the dorsalis pedis pulses. She also of course has known severe chronic venous insufficiency with previous procedures as noted previously. I had another long discussion with the patient today about her continuing to work 12 hour shifts. I've written her out for 2 months area had concerns about this as her work location is currently undergoing significant turmoil and this may lead to her termination. She is aware of this however I agree with her that she simply cannot continue to stand for 12 hours multiple days a week with the substantial wound areas she has. 10/19/15; the Dr. Doren Custard appointment was largely for an arteriogram which was normal. She does not have an arterial issue. He didn't make a comment about her chronic venous insufficiency for which she has had previous ablations. Presumably it was not felt that anything additional could be done. The patient is now out of work as I prescribed 2 weeks ago. Her wounds look somewhat less  aggravated presumably because of this. I felt I would give debridement another try today 10/25/15; no major change in this patient's wounds. We are struggling to get her product that she can afford into her own home through her insurance. 11/01/15; no major change in the patient's wounds. I have been using silver alginate as the most affordable product. I spoke to Dr. Marla Roe last week with her requested take her to the OR for surgical debridement and placement of ACEL. Dr. Marla Roe told me that she would be willing to do this however [facility] will not cover this, fortunately the patient has [place] healthcare of some variant 11/08/15; no major change in the patient's wounds. She has been completely nonviable surface that this but is in too much pain with any attempted debridement are clinic. I have arranged for her to see Dr. Marla Roe ham of plastic  surgery and this appointment is on Monday. I am hopeful that they will take her to the OR for debridement, possible ACEL ultimately possible skin graft 11/22/15 no major change in the patient's wounds over her bilateral medial calcaneus medial malleolus into the lower legs. Surface on these does not look too bad however on the left there is surrounding erythema and tenderness. This may be cellulitis or could him sleepy tinea. 11/29/15; no major changes in the patient's wounds over her bilateral medial malleolus. There is no infection here and I don't think any additional antibiotics are necessary. There is now plan to move forward. She sees Dr. Marla Roe in a week's time for preparation for operative debridement and ACEL placement I believe on 7/12. She then has a follow-up appointment with Dr. Marla Roe on 7/21 12/28/15; the patient returns today having been taken to the [place] by Dr. Marla Roe 12/12/15 she underwent debridement, intraoperative cultures [which were negative]. She had placement of a wound VAC. Parent really ACEL was not available to be placed. The wound VAC foam apparently adhered to the wound since then she's been using silver alginate, Xeroform under Ace wraps. She still says there is a lot of drainage and a lot of pain 01/31/16; this is a patient I see monthly. I had referred her to Dr. Marla Roe him of plastic surgery for large wounds on her bilateral medial ankles. She has been to the OR twice once in early July and once in early August. She tells me over the last 3 weeks she has been using the wound VAC with ACEL underneath it. On the right we've simply been using silver alginate. Under Kerlix Coban wraps. 02/28/16; this is a patient I'm currently seeing monthly. She is gone on to have a skin graft over her large venous insufficiency ulcer on the left medial ankle. This was done by Dr. Marla Roe him. The patient is a bit perturbed about why she didn't have one on her right medial  ankle wound. She has been using silver alginate to this. 03/06/16; I received a phone call from her plastic surgery Dr. Marla Roe. She expressed some concern about the viability of the skin graft she did on the left medial ankle wound. Asked me to place Endoform on this. She told me she is not planning to do a subsequent skin graft on the right as the left one did not take very well. I had placed Hydrofera Blue on the right 03/13/16; continue to have a reasonably healthy wound on the right medial ankle. Down to 3 mm in terms of size. There is epithelialization here. The area on the  left medial ankle is her skin graft site. I suppose the last week this looks somewhat better. She has an open area inferiorly however in the center there appears to be some viable tissue. There is a lot of surface callus and eschar that will eventually need to come off however none of this looked to be infected. Patient states that the is able to keep the dressing on for several days which is an improvement. 03/20/16 no major change in the circumference of either wound however on the left side the patient was at Dr. Eusebio Friendly office and they did a debridement of left wound. 50% of the wound seems to be epithelialized. I been using Endoform on the left Hydrofera Blue in the right 03/27/16; she arrives today with her wound is not looking as healthy as they did last week. The area on the right clearly has an adherent surface to this a very similar surface on the left. Unfortunately for this patient this is all too familiar problem. Clearly the Endoform is not working and will need to change that today that has some potential to help this surface. She does not tolerate debridement in this clinic very well. She is changing the dressing wants 04/03/16; patient arrives with the wounds looking somewhat better especially on the right. Dr. Migdalia Dk change the dressing to silver alginate when she saw her on Monday and also sold her  some compression socks. The usefulness of the latter is really not clear and woman with severely draining wounds. 04/10/16; the patient is doing a bit of an experiment wearing the compression stockings that Dr. Migdalia Dk provided her to her left leg and the out of legs based dressings that we provided to the right. 05/01/16; the patient is continuing to wear compression stockings Dr. Migdalia Dk provided her on the left that are apparently silver impregnated. She has been using Iodoflex to the right leg wound. Still a moderate amount of drainage, when she leaves here the wraps only last for 4 days. She has to change the stocking on the left leg every night 05/15/16; she is now using compression stockings bilaterally provided by Dr. Marla Roe. She is wearing a nonadherent layer over the wounds so really I don't think there is anything specific being done to this now. She has some reduction on the left wound. The right is stable. I think all healing here is being done without a specific dressing 06/09/16; patient arrives here today with not much change in the wound certainly in diameter to large circular wounds over the medial aspect of her ankle bilaterally. Under the light of these services are certainly not viable for healing. There is no evidence of surrounding infection. She is wearing compression stockings with some sort of silver impregnation as prescribed by Dr. Marla Roe. She has a follow-up with her tomorrow. 06/30/16; no major change in the size or condition of her wounds. These are still probably covered with a nonviable surface. She is using only her purchase stockings. She did see Dr. Marla Roe who seemed to want to apply Dakin's solution to this I'm not extreme short what value this would be. I would suggest Iodoflex which she still has at home. 07/28/16; I follow Mrs. Bacus episodically along with Dr. Marla Roe. She has very refractory venous insufficiency wounds on her bilateral medial legs  left greater than right. She has been applying a topical collagen ointment to both wounds with Adaptic. I don't think Dr. Marla Roe is planning to take her back to the OR. 08/19/16;  I follow Mrs. Jeneen Rinks on a monthly basis along with Dr. Marla Roe of plastic surgery. 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 I been following her for a number of years. At one point I was able to get the right medial malleolus wound to heal and had the left medial malleolus down to about half its current size however and I had to send her to plastic surgery for an operative debridement. Since then things have been stable to slightly improve the area on the right is slightly better one in the left about the same although there is much less adherent surface than I'm used to with this patient. She is using some form of liquid collagen gel that Dr. Marla Roe provided a Kerlix cover with the patient's own pressure stockings. She tells me that she has extreme pain in both ankles and along the lateral aspect of both feet. She has been unable to work for some period of time. She is telling me she is retiring at the beginning of April. She sees Dr. Doran Durand of orthopedics next week 09/22/16; patient has not seen Dr. Marla Roe since the last time she is here. I'm not really sure what she is using to the wounds other than bits and pieces of think she had left over including most recently Hydrofera Blue. She is using juxtalite stockings. She is having difficulty with her husband's recent illness "stroke". She is having to transport him to various doctors appointments. Dr. Marla Roe left her the option of a repeat debridement with ACEL however she has not been able to get the time to follow-up on this. She continues to have a fair amount of drainage out of these wounds with certainly precludes leaving dressings on all week 10/13/16; patient has not seen Dr.  Marla Roe since she was last in our clinic. I'm not really sure what she is doing with the wounds, we did try to get her [facility] and I think she is actually using this most of the time. Because of drainage she states she has to change this every second day although this is an improvement from what she used to do. She went to see Dr. Doran Durand who did not think she had a muscular issue with regards to her feet, he referred her to a neurologist and I think the appointment is sometime in June. I changed her back to Iodoflex which she has used in the past but not recently. 11/03/16; the patient has been using Iodoflex although she ran out of this. Still claims that there is a lot of drainage although the wound does not look like this. No surrounding erythema. She has not been back to see Dr. Marla Roe 11/24/16; the patient has been using Iodoflex again but she ran out of it 2 or 3 days ago. There is no major change in the condition of either one of these wounds in fact they are larger and covered in a thick adherent surface slough/nonviable tissue especially on the left. She does not tolerate mechanical debridement in our clinic. Going back to see Dr. Marla Roe of plastic surgery for an operative debridement would seem reasonable. 12/15/16; the patient has not been back to see Dr. Marla Roe. She is been dealing with a series of illnesses and her husband which of monopolized her time. She is been using Sorbact which we largely supplied. She states the drainage is bad enough that it maximum she can go 2-3 days without changing the  dressing 01/12/2017 -- the patient has not been back for about 4 weeks and has not seen Dr. Marla Roe not does she have any appointment pending. 01/23/17; patient has not seen Dr. Marla Roe even though I suggested this previously. She is using Santyl that was suggested last week by Dr. Con Memos this Cost her $16 through her insurance which is indeed surprising 02/12/17;  continuing Santyl and the patient is changing this daily. A lot of drainage. She has not been back to see plastic surgery she is using an Ace wrap. Our intake nurse suggested wrap around stockings which would make a good reasonable alternative 02/26/17; patient is been using Santyl and changing this daily due to drainage. She has not been to see plastic surgery she uses in April Ace wrap to control the edema. She did obtain extremitease stockings but stated that the edema in her leg was to big for these 03/20/17; patient is using Santyl and Anasept. Surfaces looked better today the area on the right is actually measuring a little smaller. She has states she has a lot of pain in her feet and ankles and is asking for a consult to pain control which I'll try to help her with through our case manager. 04/10/17; the patient arrives with better-looking wound surfaces and is slightly smaller wound on the left she is using a combination of Santyl and Anasept. She has an appointment or at least as started in the pain control center associated with [facility] 05/14/17; this is a patient who I followed for a prolonged period of time. She has venous insufficiency ulcers on her bilateral medial ankles. At one point I had this down to a much smaller wound on the left however these reopened and we've never been able to get these to heal. She has been using Santyl and Anasept gel although 2 weeks ago she ran out of the Anasept gel. She has a stable appearance of the wound. She is going to the wound care clinic at [facility]. They wanted do a nerve block/spinal block although she tells me she is reluctant to go forward with that. 05/21/17; this is a patient I have followed for many years. She has venous insufficiency ulcers on her bilateral medial ankles. Chronic pain and deformity in her ankles as well. She is been to see plastic surgery as well as orthopedics. Using PolyMem AG most recently/Kerramax/ABDs  and 2 layer compression. She has managed to keep this on and she is coming in for a nurse check to change the dressing on Tuesdays, we see her on Fridays 06/05/17; really quite a good looking surface and the area especially on the right medial has contracted in terms of dimensions. Well granulated healthy-looking tissue on both sides. Even with an open curet there is nothing that even feels abnormal here. This is as good as I've seen this in quite some time. We have been using PolyMem AG and bringing her in for a nurse check 06/12/17; really quite good surface on both of these wounds. The right medial has contracted a bit left is not. We've been using PolyMem and AG and she is coming in for a nurse visit 06/19/17; we have been using PolyMem AG and bringing her in for a nurse check. Dimensions of her wounds are not better but the surfaces looked better bilaterally. She complained of bleeding last night and the left wound and increasing pain bilaterally. She states her wound pain is more neuropathic than just the wounds. There was some  suggestion that this was radicular from her pain management doctor in talking to her it is really difficult to sort this out. 06/26/17; using PolyMem and AG and bringing her in for a nurse check as All of this and reasonably stable condition. Certainly not improved. The dimensions on the lateral part of the right leg look better but not really measuring better. The medial aspect on the left is about the same. 07/03/16; we have been using PolyMen AG and bringing her in for a nurse check to change the dressings as the wounds have drainage which precludes once weekly changing. We are using all secondary absorptive dressings.our intake nurse is brought up the idea of using a wound VAC/snap VAC on the wound to help with the drainage to see if this would result in some contraction. This is not a bad idea. The area on the right medial is actually looking smaller. Both wounds have a  reasonable-looking surface. There is no evidence of cellulitis. The edema is well controlled 07/10/17; the patient was denied for a snap VAC by her insurance. The major issue with these wounds continues to be drainage. We are using wicked PolyMem AG and she is coming in for a nurse visit to change this. The wounds are stable to slightly improved. The surface looks vibrant and the area on the right certainly has shrunk in size but very slowly 07/17/17; the patient still has large wounds on her bilateral medial malleoli. Surface of both of these wounds looks better. The dimensions seem to come and go but no consistent improvement. There is no epithelialization. We do not have options for advanced treatment products due to insurance issues. They did not approve of the wound VAC to help control the drainage. More recently we've been using PolyMem and AG wicked to allow drainage through. We have been bringing her in for a nurse visit to change this. We do not have a lot of options for wound care products and the home again due to insurance issues 07/24/17; the patient's wound actually looks somewhat better today. No drainage measurements are smaller still healthy-looking surface. We used silver collagen under PolyMen started last week. We have been bringing her in for a dressing change 07/31/17; patient's wound surface continued to look better and I think there is visible change in the dimensions of the wound on the right. Rims of epithelialization. We have been using silver collagen under PolyMen and bringing her in for a dressing change. There appears to be less drainage although she is still in need of the dressing change 08/07/17. Patient's wound surface continues to look better on both sides and the area on the right is definitely smaller. We have been using silver collagen and PolyMen. She feels that the drainage has been it has been better. I asked her about her vascular status. She went to see Dr.  Aleda Grana at [facility] and had some form of ablation. I don't have much detail on this. I haven't my notes from 2016 that she was not a candidate for any further ablation but I don't have any more information on this. We had referred her to vein and vascular I don't think she ever went. He does not have a history of PAD although I don't have any information on this either. We don't even have ABIs in our record 08/14/17; we've been using silver collagen and PolyMen cover. And putting the patient and compression. She we are bringing her in as a nurse visit to change  this because ofarge amount of drainage. We didn't the ABIs in clinic today since they had been done in many moons 1.2 bilaterally. She has been to see vein and vascular however this was at [facility] and she had ablation although I really don't have any information on this all seemed biking get a report. She is also been operatively debrided by plastic surgery and had a cell placed probably 8-12 months ago. This didn't have a major effect. We've been making some gains with current dressings 08/19/17-She is here in follow-up evaluation for bilateral medial malleoli ulcers. She continues to tolerate debridement very poorly. We will continue with recently changed topical treatment; if no significant improvement may consider switching to Iodosorb/Iodoflex. 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 She has been using silver collagen and PolyMem. I believe she has been used and tried on Iodoflex before. 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 These have been very refractory over time. We have been using silver collagen and PolyMen. She is coming in once a week for a doctor's and once a week for  nurse visits. We are actually making some progress 09/18/17; the patient's wounds are smaller especially on the right medial. She arrives today to upset to consider even washing these off with Anasept which I think is been part of the reason this is been closing. We've been using collagen covered in PolyMen otherwise. It is noted that she has a small area of folliculitis on the right medial calf that. As we are wrapping her legs I'll give her a short course of doxycycline to make sure this doesn't amount to anything. She is a long list of complaints today including imbalance, shortness of breath on exertion, inversion of her left ankle. With regards to the latter complaints she is been to see orthopedics and they offered her a tendon release surgery I believe but wanted her wounds to be closed first. I have recommended she go see her primary physician with regards to everything else. 09/25/17; patient's wounds are about the same size. We have made some progress bilaterally although not in recent weeks. She will not allow me T wash these wounds with Anasept even if she is doing her cell. Wheeze we've been using collagen covered in PolyMen. Last week she had a small area of folliculitis this is now opened into a small wound. She completed 5 days of trimethoprim sulfamethoxazole 10/02/17; unfortunately the area on her left medial ankle is worse with a larger wound area towards the Achilles. The patient complains of a lot of pain. She will not allow debridement although visually I don't think there is anything to debridement in any case. We have been using silver collagen and PolyMen for several months now. Initially we are making some progress although I'm not really seeing that today. We will move back to [facility]. His admittedly this is a bit of a repeat however I'm hoping that his situation is different now. The patient tells me she had her leg on the left give out on her yesterday this is process  some pain. 10/09/17; the patient is seen twice a week largely because of drainage issues coming out of the chronic medial bimalleolar wounds that are chronic. Last week the dimensions of the one on the  left looks a little larger I changed her to [facility]. She comes in today with a history of terrible pain in the bilateral wound areas. She will not allow debridement. She will not even allow a tissue culture. There is no surrounding erythema no no evidence of cellulitis. We have been putting her Kerlix Coban man. She will not allow more aggressive compression as there was a suggestion to put her in 3 layer wraps. 10/16/17; large wounds on her bilateral medial malleoli. These are chronic. Not much change from last week. The surface looks have healthy but absolutely no epithelialization. A lot of pain little less so of drainage. She will not allow debridement or even washing these off in the vigorous fashion with Anasept. 10/23/17; large wounds on her bilateral malleoli which are chronic. Some improvement in terms of size perhaps on the right since last time I saw these. She states that after we increased the 3 layer compression there was some bleeding, when she came in for a nurse visit she did not want 3 layer compression put back on about our nurse managed to convince her. She has known chronic venous visit issues and I'm hoping to get her to tolerate the 3 layer compression. using Hydrofera Blue 10/30/17; absolutely no change in the condition of either wound although we've had some improvement in dimensions on the right.. Attempted to put her in 3 layer compression she didn't tolerated she is back in 2 layer compression. We've been using Hydrofera Blue We looked over her past records. She had venous reflux studies in November 2016. There was no evidence of deep venous reflux on the right. Superficial vein did not show the greater saphenous vein at think this is been previously ablated the small  saphenous vein was within normal limits. The left deep venous system showed no DVT the vessels were positive for deep venous reflux in the posterior tibial veins at the ankle. The greater saphenous vein was surgically absent small saphenous vein was within normal limits. She went to vein and vascular at [facility]. I believe she had an ablation on the left greater saphenous vein. I'll update her reflux studies perhaps ever reviewed by vein and vascular. We've made absolutely no progress in these wounds. Will also try to read and TheraSkins through her insurance 11/06/17; W the patient apparently has a 2 week follow-up with vein and vascular I like him to review the whole issue with regards to her previous vascular workup by Dr. Aleda Grana. We've really made no progress on these wounds in many months. She arrives today with less viable looking surface on the left medial ankle wound. This was apparently looking about the same on Tuesday when she was here for nurse visit. 11/13/17; deep tissue culture I did last time of the left lower leg showed multiple organisms without any predominating. In particular no Staphylococcus or group A strep were isolated. We sent her for venous reflux studies. She's had a previous left greater saphenous vein stripping and I think sclerotherapy of the right greater saphenous vein. She didn't really look at the lesser saphenous vein this both wounds are on the medial aspect. She has reflux in the common femoral vein and popliteal vein and an accessory vein on the right and the common femoral vein and popliteal vein on the left. I'm going to have her go to see vein and vascular just the look over things and see if anything else beside aggressive compression is indicated here. We have not  been able to make any progress on these wounds in spite of the fact that the surface of the wounds is never look too bad. 11/20/17; no major change in the condition of the wounds. Patient  reports a large amount of drainage. She has a lot of complaints of pain although enlisting her today I wonder if some of this at least his neuropathic rather than secondary to her wounds. She has an appointment with vein and vascular on 12/30/17. The refractory nature of these wounds in my mind at least need vein and vascular to look over the wounds the recent reflux studies we did and her history to see if anything further can be done here. I also note her gait is deteriorated quite a bit. 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 She has a bilateral Trendelenburg gait. I wonder if this is neuropathic or perhaps multilevel radicular. 11/27/17; her wounds actually looks slightly better. Healthy-looking granulation tissue a scant amount of epithelialization. [place] [employer] will not pay for [employer]. They will play for tri layer Oasis and Dermagraft. This is not a diabetic ulcer. We'll try for the tri layer Oasis. She still complains of some drainage. She has a vein and vascular appointment on 12/30/17 12/04/17; the wounds visually look quite good. Healthy-looking granulation with some degree of epithelialization. We are still waiting for response to our request for trial to try layer Oasis. Her appointment with vascular to review venous and arterial issues isn't sold the end of July 7/31. Not allow debridement or even vigorous cleansing of the wound surface. 12/18/17; slightly smaller especially on the right. 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 We've been using Hydrofera Blue. We still are looking into triple layer Oasis through her insurance 01/08/18 on evaluation today patient's wound actually appears to be showing signs of good improvement at this point in time. She has been tolerating the dressing changes without complication. Fortunately there does not appear to be any evidence of infection at this point in time. We have been utilizing silver nitrate  which does seem to be of benefit for her which is also good news. Overall I'm very happy with how things seem to be both regards appearance as well as measurement. Patient did see Dr. Bridgett Larsson for evaluation on 12/30/17. In his assessment he felt that stripping would not likely add much more than chronic compression to the patient's healing process. His recommendation was to follow-up in three months with Dr. Doren Custard if she hasn't healed in order to consider referral back to you and see vascular where she previously was in a trial and was able to get her wound to heal. I'll be see what she feels she when you staying compression and he reiterated this as well. 01/13/18 on evaluation today patient appears to actually be doing very well in regard to her bilateral medial malleolus ulcers. She seems to have tolerated the chemical cauterization with silver nitrate last week she did have some pain through that evening but fortunately states that I'll be see since it seems to be doing better she is overall pleased with the progress. 01/21/18; really quite a remarkable improvement since I've last seen these wounds. We started using silver nitrate specially on the islands of hyper granulation which for some reason her around the wound circumference. This is really done quite nicely. Primary dressing Hydrofera Blue under 4 layer compression. She seems to be able to hold out without a nurse rewrap. Follow-up in 1 week  01/28/18; we've continued the hydrofera blue but continued with chemical cauterization to the wound area that we started about a month ago for irregular hyper granulation. She is made almost stunning improvement in the overall wound dimensions. I was not really expecting this degree of improvement in these chronic wounds 02/05/18; we continue with Hydrofera Bluebut of also continued the aggressive chemical cauterization with silver nitrate. We made nice progress with the right greater than left  wound. 02/12/18. We continued with Hydrofera Blue after aggressive chemical cauterization with silver nitrate. We appear to be making nice progress with both wound areas 02/19/2018; we continue with [facility] after washing the wounds vigorously with Anasept spray and chemical cauterization with silver nitrate. We are making excellent progress. The area on the right's just about closed 02/26/2018. The area on the left medial ankle had too much necrotic debris today. I used a #5 curette we are able to get most of the soft. I continued with the silver nitrate to the much smaller wound on the right medial ankle she had a new area on her right lower pretibial area which she says was due to a role in her compression 03/05/2018; both wound areas look healthy. Not much change in dimensions from last week. I continue to use silver nitrate and Hydrofera Blue. The patient saw Dr. Doren Custard of vein and vascular. He felt she had venous stasis ulcers. He felt based on her previous arteriogram she should have adequate circulation for healing. Also she has deep venous reflux but really no significant correctable superficial venous reflux at this time. He felt we should continue with conservative management including leg elevation and compression 04/02/2018; since we last saw this woman about a month ago she had a fall apparently suffered a pelvic fracture. I did not look up the x-ray. Nevertheless because of pain she literally was bedbound for 2 weeks and had home health coming out to change the dressing. Somewhat predictably this is resulted in considerable improvement in both wound areas. The right is just about closed on the medial malleolus and the left is about half the size. 04/16/2018; both her wounds continue to go down in size. Using Hydrofera Blue. 05/07/18; both her wounds appeared to be improving especially on the right where it is almost closed. We are using Hydrofera Blue 05/14/2018; slightly worse this  week with larger wounds. Surface on the left medial not quite as good. We have been using Hydrofera Blue 05/21/18; again the wounds are slightly larger. Left medial malleolus slightly larger with eschar around the circumference. We have been using Hydrofera Blue undergoing a wraps for a prolonged period of time. This got a lot better when she was more recumbent due to a fall and a back injury. I change the primary dressing the silver alginate today. She did not tolerate a 4 layer compression previously although I may need to bring this up with her next time 05/28/2018; area on the left medial malleolus again is slightly larger with more drainage. Area on the right is roughly unchanged. She has a small area of folliculitis on the right medial just on the lower calf. This does not look ominous. 06/03/2018 left medial malleolus slightly smaller in a better looking surface. We used silver nitrate on this last time with silver alginate. The area on the right appears slightly smaller 1/10; left medial malleolus slightly smaller. Small open area on the right. We used silver nitrate and silver alginate as of 2 weeks ago. We continue with  the wound and compression. These got a lot better when she was off her feet 1/17; right medial malleolus wound is smaller. The left may be slightly smaller. Both surfaces look somewhat better. 1/24; both wounds are slightly smaller. Using silver alginate under Unna boots 1/31; both wounds appear smaller in fact the area on the right medial is just about closed. Surface eschar. We have been using silver alginate under Unna boots. The patient is less active now spends let much less time on her feet and I think this is contributed to the general improvement in the wound condition 2/7; both wounds appear smaller. I was hopeful the right medial would be closed however there there is still the same small open area. Slight amount of surface eschar on the left the dimensions are  smaller there is eschar but the wound edges appear to be free. We have been using silver alginate under Unna boot's 2/14; both wounds once again measure smaller. Circumferential eschar on the left medial. We have been using silver alginate under Unna boots with gradual improvement 2/21; the area on the right medial malleolus has healed. The area on the left is smaller. We have been using silver alginate and Unna boots. We can discharge wrapping the right leg she has 20/30 stockings at home she will need to protect the scar tissue in this area 2/28; the area on the right medial malleolus remains closed the patient has a compression stocking. The area on the left is smaller. We have been using silver alginate and Unna boots. 3/6 the area on the right medial ankle remains closed. Good edema control noted she is using her own compression stocking. The area on the left medial ankle is smaller. We have been managing this with silver alginate and Unna boots which we will continue today. 3/13; the area on the right medial ankle remains closed and I'm declaring it healed today. When necessary the left is about the same still a healthy-looking surface but no major change and wound area. No evidence of infection and using silver alginate under unna and generally making considerable improvement 3/27 the area on the right medial ankle remains closed the area on the left is about the same as last week. Certainly not any worse we have been using silver alginate under an Unna boot 4/3; the area on the right medial ankle remains closed per the patient. We did not look at this wound. The wound on the left medial ankle is about the same surface looks healthy we have been using silver alginate under an Unna boot 4/10; area on the right medial ankle remains closed per the patient. We did not look at this wound. The wound on the left medial ankle is slightly larger. The patient complains that the [facility] caused  burning pain all week. She also told us that she was a lot more active this week. Changed her back to silver alginate 4/17; right medial ankle still closed per the patient. Left medial ankle is slightly larger. Using silver alginate. She did not tolerate Hydrofera Blue on this area 4/24; right medial ankle remains closed we have not look at this. The left medial ankle continues to get larger today by about a centimeter. We have been using silver alginate under Unna boots. She complains about 4 layer compression as an alternative. She has been up on her feet working on her garden 5/8; right medial ankle remains closed we did not look at this. The left medial ankle has  increased in size about 100%. We have been using silver alginate under Unna boots. She noted increased pain this week and was not surprised that the wound is deteriorated 5/15; no major change in SA however much less erythema ( one week of doxy ocellulitis). 5/22-65 year old female returns at 1 week to the clinic for left medial ankle wound for which we have been using silver alginate under 3 layer compression She was placed on DOXY at last visit - the wound is wider at this visit. She is in 3 layer compression 5/29; change to [facility] last week. I had given her empiric doxycycline 2 weeks ago for a week. She is in 3 layer compression. She complains of a lot of pain and drainage on presentation today. 6/5; using Hydrofera Blue. I gave her doxycycline recently empirically for erythema and pain around the wound. Believe her cultures showed enterococcus which not would not have been well covered by doxycycline nevertheless the wound looks better and I don't feel specifically that the enterococcus needs to be covered. She has a new what looks like a wrap injury on her lateral left ankle. 6/12; she is using Hydrofera Blue. She has a new area on the left anterior lower tibial area. This was a wrap injury last week. 6/19; the patient is  using Hydrofera Blue. She arrived with marked inflammation and erythema around the wound and tenderness. 12/01/18 on evaluation today patient appears to be doing a little bit better based on what I'm hearing from the standpoint of lassos evaluation to this as far as the overall appearance of the wound is concerned. Then sometime substandard she typically sees Dr. Dellia Nims. Nonetheless overall very pleased with the progress that she's made up to this point. No fevers, chills, nausea, or vomiting noted at this time. 7/10; some improvement in the surface area. Aggressively debrided last week apparently. I went ahead with the debridement today although the patient does not tolerate this very well. We have been using Iodoflex. Still a fair amount of drainage 7/17; slightly smaller. Using Iodoflex. 7/24; no change from last week in terms of surface area. We have been using Iodoflex. Surface looks and continues to look somewhat better 7/31; surface area slightly smaller better looking surface. We have been using Iodoflex. This is under Unna boot compression 8/7-Patient presents at 1 week with Unna boot and Iodoflex, wound appears better 8/14-Patient presents at 1 week with Iodoflex, we use the Unna boot, wound appears to be stable better.Patient is getting Botox treatment for the inversion of the foot for tendon release, Next week 8/21; we are using Iodoflex. Unna boot. The wound is stable in terms of surface area. Under illumination there is some areas of the wound that appear to be either epithelialized or perhaps this is adherent slough at this point I was not really clear. It did not wipe off and I was reluctant to debride this today. 8/28; we are using Iodoflex in an Unna boot. Seems to be making good improvement. 9/4; using Iodoflex and wound is slightly smaller. 9/18; we are using Iodoflex with topical silver nitrate when she is here. The wound continues to be smaller 10/2; patient missed her  appointment last week due to GI issues. She left and Iodoflex based dressing on for 2 weeks. Wound is about the same size about the size of a dime on the left medial lower 10/9 we have been using Iodoflex on the medial left ankle wound. She has a new superficial probable wrap injury  on the dorsal left ankle 10/16; we have been using Hydrofera Blue since last week. This is on the left medial ankle 10/23; we have been using Hydrofera Blue since 2 weeks ago. This is on the left medial ankle. Dimensions are better 11/6; using Hydrofera Blue. I think the wound is smaller but still not closed. Left medial ankle 11/13; we have been using Hydrofera Blue. Wound is certainly no smaller this week. Also the surface not as good. This is the remanent of a very large area on her left medial ankle. 11/20; using Sorbact since last week. Wound was about the same in terms of size although I was disappointed about the surface debris 12/11; 3-week follow-up. Patient was on vacation. Wound is measuring slightly larger we have been using Sorbact. 12/18; wound is about the same size however surface looks better last week after debridement. We have been using Sorbact under compression 1/15 wound is probably twice the size of last time increased in length nonviable surface. We have been using Sorbact. She was running a mild fever and missed her appointment last week 1/22; the wound is come down in size but under illumination still a very adherent debris we have been Hydrofera Blue that I changed her to last week 1/29; dimensions down slightly. We have been using Hydrofera Blue 2/19 dimensions are the same however there is rims of epithelialization under illumination. Therefore more the surface area may be epithelialized 2/26; the patient's wound actually measures smaller. The wound looks healthy. We have been using Hydrofera Blue. I had some thoughts about running Apligraf then I still may do that however this looks so much  better this week we will delay that for now 3/5; the wound is small but about the same as last week. We have been using Hydrofera Blue. No debridement is required today. 3/19; the wound is about the size of a dime. Healthy looking wound even under illumination. We have been using Hydrofera Blue. No mechanical debridement is necessary 3/26; not much change from last week although still looks very healthy. We have been using Hydrofera Blue under Unna boots Patient was offered an ankle fusion by podiatry but not until the wound heals with a proceed with this. 4/9; the patient comes in today with her original wound on the medial ankle looking satisfactory however she has some uncontrolled swelling in the middle part of her leg with 2 new open areas superiorly just lateral to the tibia. I think this was probably a wrap issue. She said she felt uncomfortable during the week but did not call in. We have been using Hydrofera Blue 4/16; the wound on the medial ankle is about the same. She has innumerable small areas superior to this across her mid tibia. I think this is probably folliculitis. She is also been working in the yard doing a lot of sweating 4/30; the patient issue on the upper areas across her mid tibia of all healed. I think this was excessive yard work if I remember. Her wound on the medial ankle is smaller. Some debris on this we have been using Hydrofera Blue under Unna boots 5/7; mid tibia. She has been using Hydrofera Blue under an Unna wrap. She is apparently going for her ankle surgery on June 3 10/28/19-Patient returns to clinic with the ankle wound, we are using Hydrofera Blue under Unna wrap, surgery is scheduled for her left foot for June 3 so she will be back for nurse visit next week READMISSION 01/17/2020 Mrs.  Cunning is a 65 year old woman we have had in this clinic for a long period of time with severe venous hypertension and refractory wounds on her medial lower legs and ankles  bilaterally. 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 When she was off her legs for a prolonged period example when she fell and suffered a compression fracture things would heal up quite nicely. She is now retired and we managed to heal up the right medial leg wound. The left one was very tiny last time I saw this although still refractory. 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 It got to the point where this was interfering with her walking and she elected to proceed with a ankle arthrodesis to straighten her her ankle and leave her with a functional outcome for mobilization. The patient was referred to Dr. Doren Custard and really this took some time to arrange. Dr. Doren Custard saw her on 12/07/2019. Once again he verified that she had no arterial issues. She had previously had an angiogram several years ago. Follow-up ABIs on the left showed an ABI of 1.12 with triphasic waveforms and a TBI of 0.92. She is felt to have chronic deep venous insufficiency but I do not think it was felt that anything could be done from about this from an ablation point of view. At the time Dr. Doren Custard saw this patient the wounds actually look closed via the pictures in his clinic. The patient finally underwent her surgery on 12/15/2019. This went reasonably well and there was a good anatomic outcome. She developed a small distal wound dehiscence on the lateral part of the surgical wound. However more problematically she is developed recurrence of the wound on the medial left ankle. There are actually 2 wounds here one in the distal lower leg and 1 pretty much at the level of the medial malleolus. It is a more distal area that is more problematic. She has been using Hydrofera Blue which started on Friday before this she was  simply Ace wrapping. There was a culture done that showed Pseudomonas and she is on ciprofloxacin. A recent CNS on 8/11 was negative. The patient reports some pain but I generally think this is improving. She is using a cam boot completely nonweightbearing using a walker for pivot transfers and a wheelchair 8/24; not much improvement unfortunately she has a surgical wound on the lateral part in the venous insufficiency wound medially. The bottom part of the medial insufficiency wound is still necrotic there is exposed tendon here. We have been using Hydrofera Blue under compression. Her edema control is however better 8/31; patient in for follow-up of his surgical wound on the lateral part of her left leg and chronic venous insufficiency ulcers medially. We put her back in compression last week. She comes in today with a complaint of 3 or 4 days worth of increasing pain. She felt her cam walker was rubbing on the area on the back of her heel. However there is intense erythema seems more likely she has cellulitis. She had 2 cultures done when she was seeing podiatry in the postop. One of them in late July showed Pseudomonas and she received a course of ciprofloxacin the other was negative on 8/11 she is allergic to penicillin with anaphylactoid complaints of hives oral  swelling via information in epic 9/9; when I saw this patient last week she had intense anterior erythema around her wound on the right lateral heel and ankle and also into the right medial heel. Some of this was no doubt drainage and her walker boot however I was convinced she had cellulitis. I gave her Levaquin and Bactrim she is finishing up on this now. She is following up with Dr. [name] he saw her yesterday. He is taken her out of the walking boot of course she is still nonweightbearing. Her x-ray was negative for any worrisome features such as soft tissue air etc. Things are a lot better this week. She has home health. We have been  using Hydrofera Blue under an [employer] which she put back on yesterday. I did not wrap her last week 9/17; her surrounding skin looks a lot better. In fact the area on the left lateral ankle has just a scant amount of eschar. The only remaining wound is the large area on the left medial ankle. Probably about 60% of this is healthy granulation at the surface however she has a significant divot distally. This has adherent debris in it. I been using debridement and silver collagen to try and get this area to fill-in although I do not think we have made much progress this week 9/24; the patient's wound on the left medial ankle looks a lot better. The deeper divot area distally still requires debridement but this is cleaning up quite nicely we have been using silver collagen. The patient is complaining of swelling in her foot and is worried that that is contributing to the nonhealing of the ankle wound. She is also complaining of numbness in her anterior toes 10/4; left medial ankle. The small area distally still has a divot with necrotic material that I have been debriding away. This has an undermining area. She is approved for Apligraf. She saw Dr. [name] her surgeon on 10/1. I think he declared himself is satisfied with the condition of things. Still nonweightbearing till the end of the month. 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 Her surgical wound is well closed. There is no evidence of infection 10/11; the patient arrived in clinic today with the expectation that we be able to put an Apligraf on this area after debridement however she arrives with a relatively large amount of green drainage on the dressing. The patient states that this started on Friday. She has not been systemically unwell. 10/19; culture I did last week showed both Enterococcus and Pseudomonas. I think this came in separate parts because I stopped her ciprofloxacin I gave her and prescribed her  linezolid however now looking at the final culture result this is Pseudomonas which is resistant to quinolones. She has not yet picked up the linezolid apparently phone issues. We are also trying to get a topical antibiotic out of [place] in [place] they can be applied by home health. She is still having green drainage 10/16; the patient has her topical antibiotic from [facility] in [place]. This is a compounded gel with vancomycin and ciprofloxacin and gentamicin. We are applying this on the wound bed with silver alginate over the top with Unna boot wraps. She arrives in clinic today with a lot less ominous looking drainage although she is only use this topical preparation once the second time today. She sees Dr. [name] her surgeon on Friday she has home health changing the dressing 11/2; still using  her compounded topical antibiotic under silver alginate. Surface is cleaning up there is less drainage. We had an Apligraf for her today and I elected to apply it. A light coating of her antibiotic 04/25/2020 upon evaluation today patient appears to be doing well with regard to her ankle ulcer. There is a little bit of slough noted on the surface of the wound I am can have to perform sharp debridement to clear this away today. With that being said other than that fact overall I feel like she is making progress and we do see some new epithelial growth. There is also some improvement in the depth of the wound and that distal portion. There is little bit of slough there as well. 12/7; 2-week follow-up. Apligraf #3. Dimensions are smaller. Closing in especially inferiorly. Still some surface debris. Still using the [facility] topical antibiotic but I told her that I don't think this needs to be renewed 12/21; 2-week follow-up. Apligraf #4 dimensions are smaller. Nice improvement 06/05/2020; 2-week follow-up. The patient's wound on the left medial ankle looks really excellent. Nice granulation.  Advancing epithelialization no undermining no evidence of infection. We would have to reapply for another Apligraf but with the condition of this wound I did not feel strongly about it. We used Hydrofera Blue under the same degree of compression. She follows up with Dr. [name] her surgeon a week Friday 06/13/2020 upon evaluation today patient appears to be doing excellent in regard to her wound. She has been tolerating the dressing changes without complication. Fortunately there is no signs of active infection at this time. No fevers, chills, nausea, vomiting, or diarrhea. She was using Hydrofera Blue last week. 06/20/2020 06/20/2020 on evaluation today patient actually appears to be doing excellent in regard to her wound. This is measuring better and looking much better as well. She has been using the collagen that seems to be doing better for her as well even though the [facility] was and is not sticking or feeling as rough on her wound. She did see Dr. [name] on Friday he is very pleased he also stated none of the hardware has shifted. That is great news 1/27; the patient has a small clean wound all that is remaining. I agree that this is too small to really consider an Apligraf. Under illumination the surface is looking quite good. We have been using collagen although the dimensions are not any better this week 2/2; the patient has a small clean wound on the left medial ankle. Although this left of her substantial original areas. Measurements are smaller. We have been using polymen Ag under an Haematologist. 2/10; small area on the left medial ankle. This looks clean nothing to debride however dimensions are about the same we have been using polymen I think now for 2 weeks 2/17; not much change in surface area. We have been using polymen Ag without any improvement. 3/17; 1 month follow-up. The patient has been using endoform without any improvement in fact I think this looks worse with more depth and  more expansion 3/24; no improvement. Perhaps less debris on the surface. We have been using Sorbact for 1 week 4/4; wound measures larger. She has edema in her leg and her foot which she tells as her wrap came down. We have been using Unna boots. Sorbact of the wound. She has been approved for Apligraf 09/12/2020 upon evaluation today patient appears to be doing well with regard to her wound. We did get the Apligraf reapproved  which is great news we have that available for application today. Fortunately there is no signs of infection and overall the patient seems to be doing great. The wound bed is nice and clean. 4/27; patient presents for her second application of Apligraf. She states over the past week she has been on her feet more often due to being outside in her garden. She has noted more swelling to her foot as a result. She denies increased warmth, pain or erythema to the wound site. 10/10/2020 upon evaluation today patient appears to be doing well with regard to her wound which does not appear to be quite as irritated as last week from what I am hearing. With that being said unfortunately she is having issues with some erythema and warmth to touch as well as an increase size. I do believe this likely is infected. 10/17/2020 upon evaluation today patient appears to be doing excellent in regard to her wound this is significantly improved as compared to last week. Fortunately I think that the infection is much better controlled at this point. She did have evidence of both Enterococcus as well as Staphylococcus noted on culture. Enterococcus really would not be helped significantly by the Cipro but the wound is doing so much better I am under the assumption that the Staphylococcus is probably the main organism that is causing the current infection. Nonetheless I think that she is doing excellent as far as that is concerned and I am very pleased in that regard. I would therefore recommend she  continue with the Cipro. 10/31/2020 upon evaluation today patient appears to be doing well with regard to her wound. She has been tolerating the dressing changes without complication. Fortunately there is no signs of active infection and overall I am extremely pleased with where things stand today. No fevers, chills, nausea, vomiting, or diarrhea. With that being said she does have some green drainage coming from the wound and although it looks okay I am a little concerned about the possibility of a continuing infection. Specifically with Pseudomonas. For that reason I will go ahead and send in a prescription for Cipro for her to be continued. 11/14/2020 upon evaluation today patient appears to be doing very well currently in regard to her wound on her leg. She has been tolerating the dressing changes without complication. Fortunately I feel like the infection is finally under good control here. Unfortunately we do not have the Apligraf for application today although we can definitely order to have it in place for next week. That will be her fifth and final of the current series. Nonetheless I feel like her wound is really doing quite well which is great news. 11/21/2020 upon evaluation today patient appears to be doing well with regard to her wound on the medial ankle. Fortunately I think the infection is under control and I do believe we can go ahead and reapply the Apligraf today. She is in agreement with that plan. There does not appear to be any signs of active infection at this time which is great news. No fevers, chills, nausea, vomiting, or diarrhea. 12/05/2020 upon evaluation today patient's wound bed actually showed signs of good granulation epithelization at this point. There does not appear to be any signs of infection which is great news and overall very pleased with where things stand. Overall the patient seems to be doing fairly well in my opinion with regard to her wound although I do believe  she continues to build up a lot  of biofilm I think she could benefit from using PuraPly at this point. 12/12/2020 upon evaluation today patient's wound actually appears to be doing decently well today. The Unna boot has not been quite as well-tolerated so that more uncomfortable for her and even causing some pressure over the plate on the lateral portion of her foot which is 90 where the wound is. There did not appear to be any significant deep tissue injury with that there may be a minimal change in the skin noted I think that we may want to go back to the Coflex 2 layer which is a little bit easier on her skin it seems. 12/19/2020 upon evaluation today patient actually seems to be making great progress with the PuraPly currently. She in fact seems to be much better as far as the overall appearance of the wound bed is concerned I am very happy in this regard. I do not see any signs of of infection which is great news as well. No fevers, chills, nausea, vomiting, or diarrhea. 12/26/2020 upon evaluation today patient appears to actually be doing better in regard to her wound on the left medial ankle region. The surface of the wound is actually doing significantly better which is great news. There does not appear to be any signs of infection which is also great news and in general I am extremely pleased with where we stand today. 01/02/2021 upon evaluation today patient appears to be doing well with regard to her wound. In fact this is showing signs of excellent improvement and very pleased with where things stand. In fact the last 3 appointments have all shown signs of this getting smaller which is excellent news. I have not even had to perform any debridement and today is no exception. Overall I feel like this is dramatically improved compared to previous. T oday is PuraPly application #4. 4/48/1856 upon evaluation today patient appears to be doing excellent in regard to her wound this is continue to show  signs of improvement and overall I am extremely pleased with where we stand today. She is actually here for PuraPly application #5. Every time we have applied this she is noted definite improvement on measurements. 01/23/2021 upon evaluation today patient is actually making good progress in regard to her wound. This was actually on just a little bit longer this time compared to previous due to the fact that she did have to go out of town. She is actually here for PuraPly application #6. We have definitely been seeing improvements in the overall quality of the tissue on the surface of the wound which is awesome news. In general I think that the patient seems to be continuing to make great progress here. 01/30/2021 upon evaluation today patient's wound is actually doing excellent. There is really not any significant biofilm buildup which is great news and overall I am extremely pleased with where things stand today. There does not appear to be any signs of active infection. No fevers, chills, nausea, vomiting, or diarrhea. 02/06/2021 upon evaluation today patient's wound is actually showing signs of excellent improvement which is great news. We continue to see the benefit of the PuraPly this is doing a great job the wound seems not really irritated whatsoever and is showing signs of good granulation at this point. Overall I am extremely happy with what we are seeing. The patient likewise is happy to hear all of this as well. 02/13/2021 upon evaluation today patient appears to be doing well with regard to  her wound. This again is measuring smaller today and I am very pleased in this regard. Fortunately there does not appear to be any signs of active infection at this time which is good news from a systemic standpoint. Locally there is still some significant drainage which she does have is concerned about infection locally. No fevers, chills, nausea, vomiting, or diarrhea. 02/20/2021 upon evaluation today  patient actually appears to be doing decently well in regard to her wound. She has been tolerating the dressing changes without complication. I do believe the PuraPly is helping wound bed does appear to be doing better. There is no evidence of active infection locally or systemically at this point visually although on fluorescence imaging there still appear to be bacterial load and bioburden noted. 02/27/2021 upon evaluation today patient fortunately continues to show signs of improvement with use of the PuraPly currently. Subsequently we did review her culture results and to be honest I think that the Bactrim is probably the best option to have her continue at this point. For that reason I am get a go ahead and send in a refill today for her for an additional 2 weeks. Nonetheless I think that we are making excellent progress here. It was Enterococcus and Staphylococcus that were noted she is allergic to penicillin so there is not much I can do from the Enterococcus standpoint though the staff does seem to be sensitive to the Bactrim. 03/06/2021 upon evaluation today patient appears to be doing well with regard to her wound. This is definitely showing signs of improvement which is great news and overall I am extremely pleased with where things stand today. There does not appear to be any signs of active infection which is great news and overall and I do believe that we are headed in the appropriate direction I think the PuraPly is doing an awesome job for her. 03/20/2021 upon evaluation today patient actually appears to be doing well with regard to her wound. We have been using the PuraPly although last week when I was out of the office they actually just used endoform nonetheless this still seems to be doing great. Fortunately there does not appear to be any signs of active infection which is great news overall I think the patient is making wonderful progress. 03/27/2021 upon evaluation today patient  appears to be doing well with regard to her wound in fact there is a little drainage but otherwise her does not appear to be any major signs of open wounds nor infection at this time. Overall I am extremely pleased with where things stand. No fevers, chills, nausea, vomiting, or diarrhea. 04/03/2021 upon evaluation today patient presents for reevaluation here in the clinic and overall she seems to be doing quite well. Fortunately there does not appear to be any signs of active infection which is great news and in general I am very pleased with how things appear. There is still a very small opening remaining but in general she is looking much better. Fortunately I do not think there is any need for the PuraPly at this point. 04/10/2021 upon evaluation today patient's wound is actually showing signs of excellent improvement. I am actually very pleased with where it stands today. I think she is very close to complete closure in fact this may be healed today. Either way I think that she may benefit from using her own compression socks for the next week and then will subsequently see how things stand following. 04/17/2021 upon evaluation today  patient appears to be doing well with regard to her wound on the ankle region. This is actually showing signs of excellent improvement which is great news. I do not see any signs of active infection at this time which is great and no open wound. With that being said on the lateral portion of her ankle right at the base of her plate she actually has an area of pressure that occurred over the past week when she was not in the wrap or we been padding her. This does seem to be an issue which I think is good to be an ongoing problem I am can actually get in touch with Dr. [name] today to see what we can do in that regard. 04/24/2021 upon evaluation today patient appears to be doing well with regard to her ankle region for the most part although there does appear to be some  fluid trapped underneath the area where we thought this was healed last week. Unfortunately I think at this time that we can have to reopen and clean away some of this area and then subsequently depending on how things do over the next week or so we will see about discharge but right now I do not think that is the right way to go. She voiced understanding. 05/01/2021 upon evaluation today patient's wound still appears to be open unfortunately. I do believe that we do need to see what we can do about trying to get this closed I believe compression wrapping is probably can to be indicated here. Fortunately there is no signs of active infection at this time. Electronic Signature(s) Signed: 05/01/2021 5:55:48 PM By: [name] PA-C Entered By: [name] on 05/01/2021 17:55:47 -------------------------------------------------------------------------------- Physical Exam Details Patient Name: Date of Service: [name] [name], [name] 05/01/2021 10:00 A M Medical Record Number: [id number] Patient Account Number: [id number] Date of Birth/Sex: Treating RN: 12-20-1955 (65 y.o. [name] Provider: [name] Other Clinician: Referring Provider: Treating Provider/Extender: [name] in Treatment: 10 Constitutional Well-nourished and well-hydrated in no acute distress. Respiratory normal breathing without difficulty. Psychiatric this patient is able to make decisions and demonstrates good insight into disease process. Alert and Oriented x 3. pleasant and cooperative. Notes Upon inspection patient's wound bed showed signs of good granulation and epithelization at this point. I do not see any evidence of active infection locally nor systemically currently and this overall is great news. No fevers, chills, nausea, vomiting, or diarrhea. Electronic Signature(s) Signed: 05/01/2021 5:56:04 PM By: [name] PA-C Entered By: [name] on  05/01/2021 17:56:04 -------------------------------------------------------------------------------- Physician Orders Details Patient Name: Date of Service: [name], [name] 05/01/2021 10:00 A M Medical Record Number: [id number] Patient Account Number: [id number] Date of Birth/Sex: Treating RN: 02/16/1956 (65 y.o. [name] Provider: [name] Other Clinician: Referring Provider: Treating Provider/Extender: [name] in Treatment: 1 Verbal / Phone Orders: No Diagnosis Coding ICD-10 Coding Code Description [phone] Chronic venous hypertension (idiopathic) with ulcer and inflammation of left lower extremity L97.828 Non-pressure chronic ulcer of other part of left lower leg with other specified severity L97.328 Non-pressure chronic ulcer of left ankle with other specified severity Follow-up Appointments ppointment in 1 week. - with Margarita Grizzle Return A Edema Control - Lymphedema / SCD / Other Bilateral Lower Extremities Patient to wear own compression stockings every day. Exercise regularly Moisturize legs daily. Non Wound Condition pply the following to  affected area as directed: - Apply silicone bordered foam to left lateral foot for protection until your f/u with Dr. [name] A Wound Treatment Wound #15R - Malleolus Wound Laterality: Left, Medial Cleanser: Wound Cleanser (Generic) 1 x Per Day/15 Days Discharge Instructions: Cleanse the wound with wound cleanser prior to applying a clean dressing using gauze sponges, not tissue or cotton balls. Prim Dressing: Maxorb Extra Calcium Alginate 2x2 in (Generic) 1 x Per Day/15 Days ary Discharge Instructions: Apply calcium alginate to wound bed as instructed Secondary Dressing: Woven Gauze Sponge, Non-Sterile 4x4 in (Generic) 1 x Per Day/15 Days Discharge Instructions: Apply over primary dressing as directed. Compression Wrap: CoFlex TLC XL 2-layer Compression System 4x7 (in/yd) 1 x Per  Day/15 Days Discharge Instructions: Apply CoFlex 2-layer compression as directed. (alt for 4 layer) Electronic Signature(s) Signed: 05/01/2021 4:52:58 PM By: Baruch Gouty RN, BSN Signed: 05/01/2021 6:11:30 PM By: [name] PA-C Entered By: Baruch Gouty on 05/01/2021 11:18:31 -------------------------------------------------------------------------------- Problem List Details Patient Name: Date of Service: [name], [name] 05/01/2021 10:00 A M Medical Record Number: [id number] Patient Account Number: [id number] Date of Birth/Sex: Treating RN: 11/14/1955 (65 y.o. 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 05/01/2021 10:00 A M Medical Record Number: [id number] Patient Account Number: [id number] Date of Birth/Sex: Treating RN: 11-18-1955 (65 y.o. [name] Provider:  [name] Other Clinician: Referring Provider: Treating Provider/Extender: [name] in Treatment: 90 Subjective Chief Complaint Information obtained from Patient patient is been followed long-term in this clinic for venous insufficiency ulcers with inflammation, hypertension and ulceration over the medial ankle bilaterally. 01/17/2020; this is a patient who is here for review of postoperative wounds on the left lateral ankle and recurrence of venous stasis ulceration on the left medial History of Present Illness (HPI) the remaining wound is over the left medial ankle. Similar wound over the right medial ankle healed largely with use of Apligraf. Most recently we have been using Hydrofera Blue over this wound with considerable improvement. The patient has been extensively worked up in the past for her venous insufficiency and she is not a candidate for antireflux surgery although I have none of the details available currently. 08/24/14; considerable improvement today. About 50% of this wound areas now epithelialized. The base of the wound appears to be healthier granulation.as opposed to last week when she had deteriorated a considerable improvement 08/17/14; unfortunately the wound has regressed somewhat. The areas of epithelialization from the superior aspect are not nearly as healthy as they were last week. The patient thinks her Hydrofera Blue slipped. 09/07/14; unfortunately the area has markedly regressed in the 2 weeks since I've  seen this. There is an odor surrounding erythema. The healthy granulation tissue that we had at the base of the wound now is a dusky color. The nurse reports green drainage 09/14/14; the area looks somewhat better than last week. There is less erythema and less drainage. The culture I did did not show any growth. Nevertheless I think it is better to continue the Cipro and doxycycline for a further week. The remaining wound area was  debridement. 09/21/14. Wound did not require debridement last week. Still less erythema and less drainage. She can complete her antibiotics. The areas of epithelialization in the superior aspect of the wound do not look as healthy as they did some weeks ago 10/05/14 continued improvement in the condition of this wound. There is advancing epithelialization. Less aggressive debridement required 10/19/14 continued improvement in the condition and volume of this wound. Less aggressive debridement to the inferior part of this to remove surface slough and fibrinous eschar 11/02/14 no debridement is required. The surface granulation appears healthy although some of her islands of epithelialization seem to have regressed. No evidence of infection 11/16/14; lites surface debridement done of surface eschar. The wound does not look to be unhealthy. No evidence of infection. Unfortunately the patient has had podiatry issues in the right foot and for some reason has redeveloped small surface ulcerations in the medial right ankle. Her original presentation involved wounds in this area 11/23/14 no debridement. The area on the right ankle has enlarged. The left ankle wound appears stable in terms of the surface although there is periwound inflammation. There has been regression in the amount of new skin 11/30/14 no debridement. Both wound areas appear healthy. There was no evidence of infection. The the new area on the right medial ankle has enlarged although that both the surfaces appear to be stable. 12/07/14; Debridement of the right medial ankle wound. No no debridement was done on the left. 12/14/14 no major change in and now bilateral medial ankle wounds. Both of these are very painful but the no overt evidence of infection. She has had previous venous ablation 12/21/14; patient states that her right medial ankle wound is considerably more painful last week than usual. Her left is also somewhat painful. She could  not tolerate debridement. The right medial ankle wound has fibrinous surface eschar 12/28/14 this is a patient with severe bilateral venous insufficiency ulcers. For a considerable period of time we actually had the one on the right medial ankle healed however this recently opened up again in June. The left medial ankle wound has been a refractory area with some absent flows. We had some success with Hydrofera Blue on this area and it literally closed by 50% however it is recently opened up Foley. Both of these were debridement today of surface eschar. She tolerates this poorly 01/25/15: No change in the status of this. Thick adherent escar. Very poor tolerance of any attempt at debridement. I had healed the right medial malleolus wound for a considerable amount of time and had the left one down to about 50% of the volume although this is totally regressed over the last 48 weeks. Further the right leg has reopened. she is trying to make a appointment with pain and vascular, previous ablations with Dr. Aleda Grana. I do not believe there is an arterial insufficiency issue here 02/01/15 the status of the adherent eschar bilaterally is actually improved. No debridement was done. She did not manage to get vascular studies done 02/08/15 continued debridement of the  area was done today. The slough is less adherent and comes off with less pressure. There is no surrounding infection peripheral pulses are intact 02/15/15 selective debridement with a disposable curette. Again the slough is less adherent and comes off with less difficulty. No surrounding infection peripheral pulses are intact. 02/22/15 selective debridement of the right medial ankle wound. Slough comes off with less difficulty. No obvious surrounding infection peripheral pulses are intact I did not debridement the one on the left. Both of these are stable to improved 03/01/15 selective debridement of both wound areas using a curette to. Adherent  slough cup soft with less difficulty. No obvious surrounding infection. The patient tells me that 2 days ago she noted a rash above the right leg wrap. She did not have this on her lower legs when she change this over she arrives with widespread left greater than right almost folliculitis-looking rash which is extremely pruritic. I don't see anything to culture here. There is no rash on the rest of her body. She feels well systemically. 03/08/15; selective debridement of both wounds using a curette. Base of this does not look unhealthy. She had limegreen drainage coming out of the left leg wound and describes a lot of drainage. The rash on her left leg looks improved to. No cultures were done. 03/22/15; patient was not here last week. Basal wounds does not look healthy and there is no surrounding erythema. No drainage. There is still a rash on the left leg that almost looks vasculitic however it is clearly limited to the top of where the wrap would be. 04/05/15; on the right required a surgical debridement of surface eschar and necrotic subcutaneous tissue. I did not debridement the area on the left. These continue to be large open wounds that are not changing that much. We were successful at one point in healing the area on the right, and at the same time the area on the left was roughly half the size of current measurements. I think a lot of the deterioration has to do with the prolonged time the patient is on her feet at work 04/19/15 I attempted-like surface debridement bilaterally she does not tolerate this. She tells me that she was in allergic care yesterday with extreme pain over her left lateral malleolus/ankle and was told that she has an "sprain" 05/03/15; large bilateral venous insufficiency wounds over the medial malleolus/medial aspect of her ankles. She complains of copious amounts of drainage and his usual large amounts of pain. There is some increasing erythema around the wound on the  right extending into the medial aspect of her foot to. historically she came in with these wounds the right one healed and the left one came down to roughly half its current size however the right one is reopened and the left is expanded. 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 05/10/15 large bilateral venous insufficiency wounds. There is less adherence surface left however the surface culture that I did last week grew pseudomonas therefore bilateral selective debridement score necessary. There is surrounding erythema. The patient describes severe bilateral drainage and a lot of pain in the left ankle. Apparently her podiatrist was were ready to do a cortisone shot 05/17/15; the patient complains of pain and again copious amounts of drainage. 05/24/15; we used Iodo flex last week. Patient notes considerable improvement in wound drainage. Only needed to change this once. 05/31/15; we continued Iodoflex; the  base of these large wounds bilaterally is not too bad but there is probably likely a significant bioburden here. I would like to debridement just doesn't tolerate it. 06/06/14 I would like to continue the Iodoflex although she still hasn't managed to obtain supplies. She has bilateral medial malleoli or large wounds which are mostly superficial. Both of them are covered circumferentially with some nonviable fibrinous slough although she tolerates debridement very poorly. She apparently has an appointment for an ablation on the right leg by interventional radiology. 06/14/15; the patient arrives with the wounds and static condition. We attempted a debridement although she does not do well with this secondary to pain. I 07/05/15; wounds are not much smaller however there appears to be a cleaner granulating base. The left has tight fibrinous slough greater than the right. Debridement is tolerated poorly due to pain. Iodoflex is done more for these wounds  in any of the multitude of different dressings I have tried on the left 1 and then subsequently the right. 07/12/15; no change in the condition of this wound. I am able to do an aggressive debridement on the right but not the left. She simply cannot tolerate it. We have been using Iodoflex which helps somewhat. It is worthwhile remembering that at one point we healed the right medial ankle wound and the left was about 25% of the current circumference. We have suggested returning to vascular surgery for review of possible further ablations for one reason or another she has not been able to do this. 07/26/15 no major change in the condition of either wound on her medial ankle. I did not attempt to debridement of these. She has been aggressively scrubbing these while she is in the shower at home. She has her supply of Iodoflex which seems to have done more for these wounds then anything I have put on recently. 08/09/15 wound area appears larger although not verified by measurements. Using Iodoflex 09/05/2015 -- she was here for avisit today but had significant problems with the wound and I was asked to see her for a physician opinion. I have summarize that this lady has had surgery on her left lower extremity about 10 years ago where the possible veins stripping was done. She has had an opinion from interventional radiology around November 2016 where no further sclerotherapy was ordered. The patient works 12 hours a day and stands on a concrete floor with work boots and is unable to get the proper compression she requires and cannot elevate her limbs appropriately at any given time. She has recently grown Pseudomonas from her wound culture but has not started her ciprofloxacin which was called in for her. 09/13/15 this continues to be a difficult situation for this patient. At one point I had this wound down to a 1.5 x 1.5" wound on her left leg. This is deteriorated and the right leg has reopened. She now  has substantial wounds on her medial calcaneus, malleoli and into her lower leg. One on the left has surface eschar but these are far too painful for me to debridement here. She has a vascular surgery appointment next week to see if anything can be done to help here. I think she has had previous ablations several years ago at [facility]. She has no major edema. She tells me that she did not get product last time [facility] Ag] and went for several days without it. She continues to work in work boots 12 hours a day. She cannot get compression/4-layer  under her work boots. 09/20/15 no major change. Periwound edema control was not very good. Her point with pain and vascular is next Wednesday the 25th 09/28/15; the patient is seen vascular surgery and is apparently scheduled for repeat duplex ultrasounds of her bilateral lower legs next week. 10/05/15; the patient was seen by Dr. Doren Custard of vascular surgery. He feels that she should have arterial insufficiency excluded as cause/contributed to her nonhealing stage she is therefore booked for an arteriogram. She has apparently monophasic signals in the dorsalis pedis pulses. She also of course has known severe chronic venous insufficiency with previous procedures as noted previously. I had another long discussion with the patient today about her continuing to work 12 hour shifts. I've written her out for 2 months area had concerns about this as her work location is currently undergoing significant turmoil and this may lead to her termination. She is aware of this however I agree with her that she simply cannot continue to stand for 12 hours multiple days a week with the substantial wound areas she has. 10/19/15; the Dr. Doren Custard appointment was largely for an arteriogram which was normal. She does not have an arterial issue. He didn't make a comment about her chronic venous insufficiency for which she has had previous ablations. Presumably it was not felt that  anything additional could be done. The patient is now out of work as I prescribed 2 weeks ago. Her wounds look somewhat less aggravated presumably because of this. I felt I would give debridement another try today 10/25/15; no major change in this patient's wounds. We are struggling to get her product that she can afford into her own home through her insurance. 11/01/15; no major change in the patient's wounds. I have been using silver alginate as the most affordable product. I spoke to Dr. Marla Roe last week with her requested take her to the OR for surgical debridement and placement of ACEL. Dr. Marla Roe told me that she would be willing to do this however [employer] will not cover this, fortunately the patient has [place] healthcare of some variant 11/08/15; no major change in the patient's wounds. She has been completely nonviable surface that this but is in too much pain with any attempted debridement are clinic. I have arranged for her to see Dr. Marla Roe ham of plastic surgery and this appointment is on Monday. I am hopeful that they will take her to the OR for debridement, possible ACEL ultimately possible skin graft 11/22/15 no major change in the patient's wounds over her bilateral medial calcaneus medial malleolus into the lower legs. Surface on these does not look too bad however on the left there is surrounding erythema and tenderness. This may be cellulitis or could him sleepy tinea. 11/29/15; no major changes in the patient's wounds over her bilateral medial malleolus. There is no infection here and I don't think any additional antibiotics are necessary. There is now plan to move forward. She sees Dr. Marla Roe in a week's time for preparation for operative debridement and ACEL placement I believe on 7/12. She then has a follow-up appointment with Dr. Marla Roe on 7/21 12/28/15; the patient returns today having been taken to the [facility] by Dr. Marla Roe 12/12/15 she underwent  debridement, intraoperative cultures [which were negative]. She had placement of a wound VAC. Parent really ACEL was not available to be placed. The wound VAC foam apparently adhered to the wound since then she's been using silver alginate, Xeroform under Ace wraps. She still  says there is a lot of drainage and a lot of pain 01/31/16; this is a patient I see monthly. I had referred her to Dr. Marla Roe him of plastic surgery for large wounds on her bilateral medial ankles. She has been to the OR twice once in early July and once in early August. She tells me over the last 3 weeks she has been using the wound VAC with ACEL underneath it. On the right we've simply been using silver alginate. Under Kerlix Coban wraps. 02/28/16; this is a patient I'm currently seeing monthly. She is gone on to have a skin graft over her large venous insufficiency ulcer on the left medial ankle. This was done by Dr. Marla Roe him. The patient is a bit perturbed about why she didn't have one on her right medial ankle wound. She has been using silver alginate to this. 03/06/16; I received a phone call from her plastic surgery Dr. Marla Roe. She expressed some concern about the viability of the skin graft she did on the left medial ankle wound. Asked me to place Endoform on this. She told me she is not planning to do a subsequent skin graft on the right as the left one did not take very well. I had placed Hydrofera Blue on the right 03/13/16; continue to have a reasonably healthy wound on the right medial ankle. Down to 3 mm in terms of size. There is epithelialization here. The area on the left medial ankle is her skin graft site. I suppose the last week this looks somewhat better. She has an open area inferiorly however in the center there appears to be some viable tissue. There is a lot of surface callus and eschar that will eventually need to come off however none of this looked to be infected. Patient states that the is  able to keep the dressing on for several days which is an improvement. 03/20/16 no major change in the circumference of either wound however on the left side the patient was at Dr. Eusebio Friendly office and they did a debridement of left wound. 50% of the wound seems to be epithelialized. I been using Endoform on the left Hydrofera Blue in the right 03/27/16; she arrives today with her wound is not looking as healthy as they did last week. The area on the right clearly has an adherent surface to this a very similar surface on the left. Unfortunately for this patient this is all too familiar problem. Clearly the Endoform is not working and will need to change that today that has some potential to help this surface. She does not tolerate debridement in this clinic very well. She is changing the dressing wants 04/03/16; patient arrives with the wounds looking somewhat better especially on the right. Dr. Migdalia Dk change the dressing to silver alginate when she saw her on Monday and also sold her some compression socks. The usefulness of the latter is really not clear and woman with severely draining wounds. 04/10/16; the patient is doing a bit of an experiment wearing the compression stockings that Dr. Migdalia Dk provided her to her left leg and the out of legs based dressings that we provided to the right. 05/01/16; the patient is continuing to wear compression stockings Dr. Migdalia Dk provided her on the left that are apparently silver impregnated. She has been using Iodoflex to the right leg wound. Still a moderate amount of drainage, when she leaves here the wraps only last for 4 days. She has to change the stocking on the  left leg every night 05/15/16; she is now using compression stockings bilaterally provided by Dr. Marla Roe. She is wearing a nonadherent layer over the wounds so really I don't think there is anything specific being done to this now. She has some reduction on the left wound. The right is stable.  I think all healing here is being done without a specific dressing 06/09/16; patient arrives here today with not much change in the wound certainly in diameter to large circular wounds over the medial aspect of her ankle bilaterally. Under the light of these services are certainly not viable for healing. There is no evidence of surrounding infection. She is wearing compression stockings with some sort of silver impregnation as prescribed by Dr. Marla Roe. She has a follow-up with her tomorrow. 06/30/16; no major change in the size or condition of her wounds. These are still probably covered with a nonviable surface. She is using only her purchase stockings. She did see Dr. Marla Roe who seemed to want to apply Dakin's solution to this I'm not extreme short what value this would be. I would suggest Iodoflex which she still has at home. 07/28/16; I follow Mrs. Husak episodically along with Dr. Marla Roe. 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 She has been applying a topical collagen ointment to both wounds with Adaptic. I don't think Dr. Marla Roe is planning to take her back to the OR. 08/19/16; I follow Mrs. Jeneen Rinks on a monthly basis along with Dr. Marla Roe of plastic surgery. 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 I been following her for a number of years. At one point I was able to get the right medial malleolus wound to heal and had the left medial malleolus down to about half its current size however and I had to send her to plastic surgery for an operative debridement. Since then things have been stable to slightly improve the area on the right is slightly better one in the left about the same although there is much less adherent surface than I'm used to with this patient. She is using some form of liquid collagen gel that Dr. Marla Roe provided a Kerlix  cover with the patient's own pressure stockings. She tells me that she has extreme pain in both ankles and along the lateral aspect of both feet. She has been unable to work for some period of time. She is telling me she is retiring at the beginning of April. She sees Dr. Doran Durand of orthopedics next week 09/22/16; patient has not seen Dr. Marla Roe since the last time she is here. I'm not really sure what she is using to the wounds other than bits and pieces of think she had left over including most recently Hydrofera Blue. She is using juxtalite stockings. She is having difficulty with her husband's recent illness "stroke". She is having to transport him to various doctors appointments. Dr. Marla Roe left her the option of a repeat debridement with ACEL however she has not been able to get the time to follow-up on this. She continues to have a fair amount of drainage out of these wounds with certainly precludes leaving dressings on all week 10/13/16; patient has not seen Dr. Marla Roe since she was last in our clinic. I'm not really sure what she is doing with the wounds, we did try to get her [facility] and I think she is actually using this most of the  time. Because of drainage she states she has to change this every second day although this is an improvement from what she used to do. She went to see Dr. Doran Durand who did not think she had a muscular issue with regards to her feet, he referred her to a neurologist and I think the appointment is sometime in June. I changed her back to Iodoflex which she has used in the past but not recently. 11/03/16; the patient has been using Iodoflex although she ran out of this. Still claims that there is a lot of drainage although the wound does not look like this. No surrounding erythema. She has not been back to see Dr. Marla Roe 11/24/16; the patient has been using Iodoflex again but she ran out of it 2 or 3 days ago. There is no major change in the condition  of either one of these wounds in fact they are larger and covered in a thick adherent surface slough/nonviable tissue especially on the left. She does not tolerate mechanical debridement in our clinic. Going back to see Dr. Marla Roe of plastic surgery for an operative debridement would seem reasonable. 12/15/16; the patient has not been back to see Dr. Marla Roe. She is been dealing with a series of illnesses and her husband which of monopolized her time. She is been using Sorbact which we largely supplied. She states the drainage is bad enough that it maximum she can go 2-3 days without changing the dressing 01/12/2017 -- the patient has not been back for about 4 weeks and has not seen Dr. Marla Roe not does she have any appointment pending. 01/23/17; patient has not seen Dr. Marla Roe even though I suggested this previously. She is using Santyl that was suggested last week by Dr. Con Memos this Cost her $16 through her insurance which is indeed surprising 02/12/17; continuing Santyl and the patient is changing this daily. A lot of drainage. She has not been back to see plastic surgery she is using an Ace wrap. Our intake nurse suggested wrap around stockings which would make a good reasonable alternative 02/26/17; patient is been using Santyl and changing this daily due to drainage. She has not been to see plastic surgery she uses in April Ace wrap to control the edema. She did obtain extremitease stockings but stated that the edema in her leg was to big for these 03/20/17; patient is using Santyl and Anasept. Surfaces looked better today the area on the right is actually measuring a little smaller. She has states she has a lot of pain in her feet and ankles and is asking for a consult to pain control which I'll try to help her with through our case manager. 04/10/17; the patient arrives with better-looking wound surfaces and is slightly smaller wound on the left she is using a combination of Santyl and  Anasept. She has an appointment or at least as started in the pain control center associated with [facility] 05/14/17; this is a patient who I followed for a prolonged period of time. She has venous insufficiency ulcers on her bilateral medial ankles. At one point I had this down to a much smaller wound on the left however these reopened and we've never been able to get these to heal. She has been using Santyl and Anasept gel although 2 weeks ago she ran out of the Anasept gel. She has a stable appearance of the wound. She is going to the wound care clinic at [facility]. They wanted do a nerve block/spinal  block although she tells me she is reluctant to go forward with that. 05/21/17; this is a patient I have followed for many years. She has venous insufficiency ulcers on her bilateral medial ankles. Chronic pain and deformity in her ankles as well. She is been to see plastic surgery as well as orthopedics. Using PolyMem AG most recently/Kerramax/ABDs and 2 layer compression. She has managed to keep this on and she is coming in for a nurse check to change the dressing on Tuesdays, we see her on Fridays 06/05/17; really quite a good looking surface and the area especially on the right medial has contracted in terms of dimensions. Well granulated healthy-looking tissue on both sides. Even with an open curet there is nothing that even feels abnormal here. This is as good as I've seen this in quite some time. We have been using PolyMem AG and bringing her in for a nurse check 06/12/17; really quite good surface on both of these wounds. The right medial has contracted a bit left is not. We've been using PolyMem and AG and she is coming in for a nurse visit 06/19/17; we have been using PolyMem AG and bringing her in for a nurse check. Dimensions of her wounds are not better but the surfaces looked better bilaterally. She complained of bleeding last night and the left wound and increasing pain  bilaterally. She states her wound pain is more neuropathic than just the wounds. There was some suggestion that this was radicular from her pain management doctor in talking to her it is really difficult to sort this out. 06/26/17; using PolyMem and AG and bringing her in for a nurse check as All of this and reasonably stable condition. Certainly not improved. The dimensions on the lateral part of the right leg look better but not really measuring better. The medial aspect on the left is about the same. 07/03/16; we have been using PolyMen AG and bringing her in for a nurse check to change the dressings as the wounds have drainage which precludes once weekly changing. We are using all secondary absorptive dressings.our intake nurse is brought up the idea of using a wound VAC/snap VAC on the wound to help with the drainage to see if this would result in some contraction. This is not a bad idea. The area on the right medial is actually looking smaller. Both wounds have a reasonable-looking surface. There is no evidence of cellulitis. The edema is well controlled 07/10/17; the patient was denied for a snap VAC by her insurance. The major issue with these wounds continues to be drainage. We are using wicked PolyMem AG and she is coming in for a nurse visit to change this. The wounds are stable to slightly improved. The surface looks vibrant and the area on the right certainly has shrunk in size but very slowly 07/17/17; the patient still has large wounds on her bilateral medial malleoli. Surface of both of these wounds looks better. The dimensions seem to come and go but no consistent improvement. There is no epithelialization. We do not have options for advanced treatment products due to insurance issues. They did not approve of the wound VAC to help control the drainage. More recently we've been using PolyMem and AG wicked to allow drainage through. We have been bringing her in for a nurse visit to change  this. We do not have a lot of options for wound care products and the home again due to insurance issues 07/24/17; the patient's wound actually  looks somewhat better today. No drainage measurements are smaller still healthy-looking surface. We used silver collagen under PolyMen started last week. We have been bringing her in for a dressing change 07/31/17; patient's wound surface continued to look better and I think there is visible change in the dimensions of the wound on the right. Rims of epithelialization. We have been using silver collagen under PolyMen and bringing her in for a dressing change. There appears to be less drainage although she is still in need of the dressing change 08/07/17. Patient's wound surface continues to look better on both sides and the area on the right is definitely smaller. We have been using silver collagen and PolyMen. She feels that the drainage has been it has been better. I asked her about her vascular status. She went to see Dr. Aleda Grana at [facility] and had some form of ablation. I don't have much detail on this. I haven't my notes from 2016 that she was not a candidate for any further ablation but I don't have any more information on this. We had referred her to vein and vascular I don't think she ever went. He does not have a history of PAD although I don't have any information on this either. We don't even have ABIs in our record 08/14/17; we've been using silver collagen and PolyMen cover. And putting the patient and compression. She we are bringing her in as a nurse visit to change this because ofarge amount of drainage. We didn't the ABIs in clinic today since they had been done in many moons 1.2 bilaterally. She has been to see vein and vascular however this was at [facility] and she had ablation although I really don't have any information on this all seemed biking get a report. She is also been operatively debrided by plastic surgery and had a cell  placed probably 8-12 months ago. This didn't have a major effect. We've been making some gains with current dressings 08/19/17-She is here in follow-up evaluation for bilateral medial malleoli ulcers. She continues to tolerate debridement very poorly. We will continue with recently changed topical treatment; if no significant improvement may consider switching to Iodosorb/Iodoflex. 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 She has been using silver collagen and PolyMem. I believe she has been used and tried on Iodoflex before. 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 These have been very refractory over time. We have been using silver collagen and PolyMen. She is coming in once a week for a doctor's and once a week for nurse visits. We are actually making some progress 09/18/17; the patient's wounds are smaller especially on the right medial. She arrives today to upset to consider even washing these off with Anasept which I think is been part of the reason this is been closing. We've been using collagen covered in PolyMen otherwise. It is noted that she has a small area of folliculitis on the right medial calf that. As we are wrapping her legs I'll give her a short course of doxycycline to make sure this doesn't amount to anything. She is a long list of complaints today including imbalance, shortness of breath on exertion, inversion of her left ankle. With regards to the latter complaints she is been to see orthopedics and they  offered her a tendon release surgery I believe but wanted her wounds to be closed first. I have recommended she go see her primary physician with regards to everything else. 09/25/17; patient's wounds are about the same size. We have made  some progress bilaterally although not in recent weeks. She will not allow me T wash these wounds with Anasept even if she is doing her cell. Wheeze we've been using collagen covered in PolyMen. Last week she had a small area of folliculitis this is now opened into a small wound. She completed 5 days of trimethoprim sulfamethoxazole 10/02/17; unfortunately the area on her left medial ankle is worse with a larger wound area towards the Achilles. The patient complains of a lot of pain. She will not allow debridement although visually I don't think there is anything to debridement in any case. We have been using silver collagen and PolyMen for several months now. Initially we are making some progress although I'm not really seeing that today. We will move back to [facility]. His admittedly this is a bit of a repeat however I'm hoping that his situation is different now. The patient tells me she had her leg on the left give out on her yesterday this is process some pain. 10/09/17; the patient is seen twice a week largely because of drainage issues coming out of the chronic medial bimalleolar wounds that are chronic. Last week the dimensions of the one on the left looks a little larger I changed her to [facility]. She comes in today with a history of terrible pain in the bilateral wound areas. She will not allow debridement. She will not even allow a tissue culture. There is no surrounding erythema no no evidence of cellulitis. We have been putting her Kerlix Coban man. She will not allow more aggressive compression as there was a suggestion to put her in 3 layer wraps. 10/16/17; large wounds on her bilateral medial malleoli. These are chronic. Not much change from last week. The surface looks have healthy but absolutely no epithelialization. A lot of pain little less so of drainage. She will not allow debridement or even washing these off in the vigorous fashion with Anasept. 10/23/17; large wounds on  her bilateral malleoli which are chronic. Some improvement in terms of size perhaps on the right since last time I saw these. She states that after we increased the 3 layer compression there was some bleeding, when she came in for a nurse visit she did not want 3 layer compression put back on about our nurse managed to convince her. She has known chronic venous visit issues and I'm hoping to get her to tolerate the 3 layer compression. using Hydrofera Blue 10/30/17; absolutely no change in the condition of either wound although we've had some improvement in dimensions on the right.. Attempted to put her in 3 layer compression she didn't tolerated she is back in 2 layer compression. We've been using Hydrofera Blue We looked over her past records. She had venous reflux studies in November 2016. There was no evidence of deep venous reflux on the right. Superficial vein did not show the greater saphenous vein at think this is been previously ablated the small saphenous vein was within normal limits. The left deep venous system showed no DVT the vessels were positive for deep venous reflux in the posterior tibial veins at the ankle. The greater saphenous vein was surgically absent small saphenous vein was within normal limits. She went to vein and vascular  at [facility]. I believe she had an ablation on the left greater saphenous vein. I'll update her reflux studies perhaps ever reviewed by vein and vascular. We've made absolutely no progress in these wounds. Will also try to read and [employer] through her insurance 11/06/17; W the patient apparently has a 2 week follow-up with vein and vascular I like him to review the whole issue with regards to her previous vascular workup by Dr. Aleda Grana. We've really made no progress on these wounds in many months. She arrives today with less viable looking surface on the left medial ankle wound. This was apparently looking about the same on Tuesday when she was  here for nurse visit. 11/13/17; deep tissue culture I did last time of the left lower leg showed multiple organisms without any predominating. In particular no Staphylococcus or group A strep were isolated. We sent her for venous reflux studies. She's had a previous left greater saphenous vein stripping and I think sclerotherapy of the right greater saphenous vein. She didn't really look at the lesser saphenous vein this both wounds are on the medial aspect. She has reflux in the common femoral vein and popliteal vein and an accessory vein on the right and the common femoral vein and popliteal vein on the left. I'm going to have her go to see vein and vascular just the look over things and see if anything else beside aggressive compression is indicated here. We have not been able to make any progress on these wounds in spite of the fact that the surface of the wounds is never look too bad. 11/20/17; no major change in the condition of the wounds. Patient reports a large amount of drainage. She has a lot of complaints of pain although enlisting her today I wonder if some of this at least his neuropathic rather than secondary to her wounds. She has an appointment with vein and vascular on 12/30/17. The refractory nature of these wounds in my mind at least need vein and vascular to look over the wounds the recent reflux studies we did and her history to see if anything further can be done here. I also note her gait is deteriorated quite a bit. 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 She has a bilateral Trendelenburg gait. I wonder if this is neuropathic or perhaps multilevel radicular. 11/27/17; her wounds actually looks slightly better. Healthy-looking granulation tissue a scant amount of epithelialization. [place] [facility] will not pay for [employer]. They will play for tri layer Oasis and Dermagraft. This is not a diabetic ulcer. We'll try for the tri layer Oasis. She still complains of some  drainage. She has a vein and vascular appointment on 12/30/17 12/04/17; the wounds visually look quite good. Healthy-looking granulation with some degree of epithelialization. We are still waiting for response to our request for trial to try layer Oasis. Her appointment with vascular to review venous and arterial issues isn't sold the end of July 7/31. Not allow debridement or even vigorous cleansing of the wound surface. 12/18/17; slightly smaller especially on the right. 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 We've been using Hydrofera Blue. We still are looking into triple layer Oasis through her insurance 01/08/18 on evaluation today patient's wound actually appears to be showing signs of good improvement at this point in time. She has been tolerating the dressing changes without complication. Fortunately there does not appear to be any evidence of infection at this point in time. We have  been utilizing silver nitrate which does seem to be of benefit for her which is also good news. Overall I'm very happy with how things seem to be both regards appearance as well as measurement. Patient did see Dr. Bridgett Larsson for evaluation on 12/30/17. In his assessment he felt that stripping would not likely add much more than chronic compression to the patient's healing process. His recommendation was to follow-up in three months with Dr. Doren Custard if she hasn't healed in order to consider referral back to you and see vascular where she previously was in a trial and was able to get her wound to heal. I'll be see what she feels she when you staying compression and he reiterated this as well. 01/13/18 on evaluation today patient appears to actually be doing very well in regard to her bilateral medial malleolus ulcers. She seems to have tolerated the chemical cauterization with silver nitrate last week she did have some pain through that evening but fortunately states that I'll be see since it seems  to be doing better she is overall pleased with the progress. 01/21/18; really quite a remarkable improvement since I've last seen these wounds. We started using silver nitrate specially on the islands of hyper granulation which for some reason her around the wound circumference. This is really done quite nicely. Primary dressing Hydrofera Blue under 4 layer compression. She seems to be able to hold out without a nurse rewrap. Follow-up in 1 week 01/28/18; we've continued the hydrofera blue but continued with chemical cauterization to the wound area that we started about a month ago for irregular hyper granulation. She is made almost stunning improvement in the overall wound dimensions. I was not really expecting this degree of improvement in these chronic wounds 02/05/18; we continue with Hydrofera Bluebut of also continued the aggressive chemical cauterization with silver nitrate. We made nice progress with the right greater than left wound. 02/12/18. We continued with Hydrofera Blue after aggressive chemical cauterization with silver nitrate. We appear to be making nice progress with both wound areas 02/19/2018; we continue with [facility] after washing the wounds vigorously with Anasept spray and chemical cauterization with silver nitrate. We are making excellent progress. The area on the right's just about closed 02/26/2018. The area on the left medial ankle had too much necrotic debris today. I used a #5 curette we are able to get most of the soft. I continued with the silver nitrate to the much smaller wound on the right medial ankle she had a new area on her right lower pretibial area which she says was due to a role in her compression 03/05/2018; both wound areas look healthy. Not much change in dimensions from last week. I continue to use silver nitrate and Hydrofera Blue. The patient saw Dr. Doren Custard of vein and vascular. He felt she had venous stasis ulcers. He felt based on her previous  arteriogram she should have adequate circulation for healing. Also she has deep venous reflux but really no significant correctable superficial venous reflux at this time. He felt we should continue with conservative management including leg elevation and compression 04/02/2018; since we last saw this woman about a month ago she had a fall apparently suffered a pelvic fracture. I did not look up the x-ray. Nevertheless because of pain she literally was bedbound for 2 weeks and had home health coming out to change the dressing. Somewhat predictably this is resulted in considerable improvement in both wound areas. The right is just about closed  on the medial malleolus and the left is about half the size. 04/16/2018; both her wounds continue to go down in size. Using Hydrofera Blue. 05/07/18; both her wounds appeared to be improving especially on the right where it is almost closed. We are using Hydrofera Blue 05/14/2018; slightly worse this week with larger wounds. Surface on the left medial not quite as good. We have been using Hydrofera Blue 05/21/18; again the wounds are slightly larger. Left medial malleolus slightly larger with eschar around the circumference. We have been using Hydrofera Blue undergoing a wraps for a prolonged period of time. This got a lot better when she was more recumbent due to a fall and a back injury. I change the primary dressing the silver alginate today. She did not tolerate a 4 layer compression previously although I may need to bring this up with her next time 05/28/2018; area on the left medial malleolus again is slightly larger with more drainage. Area on the right is roughly unchanged. She has a small area of folliculitis on the right medial just on the lower calf. This does not look ominous. 06/03/2018 left medial malleolus slightly smaller in a better looking surface. We used silver nitrate on this last time with silver alginate. The area on the right appears slightly  smaller 1/10; left medial malleolus slightly smaller. Small open area on the right. We used silver nitrate and silver alginate as of 2 weeks ago. We continue with the wound and compression. These got a lot better when she was off her feet 1/17; right medial malleolus wound is smaller. The left may be slightly smaller. Both surfaces look somewhat better. 1/24; both wounds are slightly smaller. Using silver alginate under Unna boots 1/31; both wounds appear smaller in fact the area on the right medial is just about closed. Surface eschar. We have been using silver alginate under Unna boots. The patient is less active now spends let much less time on her feet and I think this is contributed to the general improvement in the wound condition 2/7; both wounds appear smaller. I was hopeful the right medial would be closed however there there is still the same small open area. Slight amount of surface eschar on the left the dimensions are smaller there is eschar but the wound edges appear to be free. We have been using silver alginate under Unna boot's 2/14; both wounds once again measure smaller. Circumferential eschar on the left medial. We have been using silver alginate under Unna boots with gradual improvement 2/21; the area on the right medial malleolus has healed. The area on the left is smaller. We have been using silver alginate and Unna boots. We can discharge wrapping the right leg she has 20/30 stockings at home she will need to protect the scar tissue in this area 2/28; the area on the right medial malleolus remains closed the patient has a compression stocking. The area on the left is smaller. We have been using silver alginate and Unna boots. 3/6 the area on the right medial ankle remains closed. Good edema control noted she is using her own compression stocking. The area on the left medial ankle is smaller. We have been managing this with silver alginate and Unna boots which we will continue  today. 3/13; the area on the right medial ankle remains closed and I'm declaring it healed today. When necessary the left is about the same still a healthy-looking surface but no major change and wound area. No evidence of infection  and using silver alginate under unna and generally making considerable improvement 3/27 the area on the right medial ankle remains closed the area on the left is about the same as last week. Certainly not any worse we have been using silver alginate under an Unna boot 4/3; the area on the right medial ankle remains closed per the patient. We did not look at this wound. The wound on the left medial ankle is about the same surface looks healthy we have been using silver alginate under an Unna boot 4/10; area on the right medial ankle remains closed per the patient. We did not look at this wound. The wound on the left medial ankle is slightly larger. The patient complains that the [facility] caused burning pain all week. She also told us that she was a lot more active this week. Changed her back to silver alginate 4/17; right medial ankle still closed per the patient. Left medial ankle is slightly larger. Using silver alginate. She did not tolerate Hydrofera Blue on this area 4/24; right medial ankle remains closed we have not look at this. The left medial ankle continues to get larger today by about a centimeter. We have been using silver alginate under Unna boots. She complains about 4 layer compression as an alternative. She has been up on her feet working on her garden 5/8; right medial ankle remains closed we did not look at this. The left medial ankle has increased in size about 100%. We have been using silver alginate under Unna boots. She noted increased pain this week and was not surprised that the wound is deteriorated 5/15; no major change in SA however much less erythema ( one week of doxy ocellulitis). 5/22-65 year old female returns at 1 week to the clinic  for left medial ankle wound for which we have been using silver alginate under 3 layer compression She was placed on DOXY at last visit - the wound is wider at this visit. She is in 3 layer compression 5/29; change to [facility] last week. I had given her empiric doxycycline 2 weeks ago for a week. She is in 3 layer compression. She complains of a lot of pain and drainage on presentation today. 6/5; using Hydrofera Blue. I gave her doxycycline recently empirically for erythema and pain around the wound. Believe her cultures showed enterococcus which not would not have been well covered by doxycycline nevertheless the wound looks better and I don't feel specifically that the enterococcus needs to be covered. She has a new what looks like a wrap injury on her lateral left ankle. 6/12; she is using Hydrofera Blue. She has a new area on the left anterior lower tibial area. This was a wrap injury last week. 6/19; the patient is using Hydrofera Blue. She arrived with marked inflammation and erythema around the wound and tenderness. 12/01/18 on evaluation today patient appears to be doing a little bit better based on what I'm hearing from the standpoint of lassos evaluation to this as far as the overall appearance of the wound is concerned. Then sometime substandard she typically sees Dr. Dellia Nims. Nonetheless overall very pleased with the progress that she's made up to this point. No fevers, chills, nausea, or vomiting noted at this time. 7/10; some improvement in the surface area. Aggressively debrided last week apparently. I went ahead with the debridement today although the patient does not tolerate this very well. We have been using Iodoflex. Still a fair amount of drainage 7/17; slightly smaller. Using  Iodoflex. 7/24; no change from last week in terms of surface area. We have been using Iodoflex. Surface looks and continues to look somewhat better 7/31; surface area slightly smaller better looking  surface. We have been using Iodoflex. This is under Unna boot compression 8/7-Patient presents at 1 week with Unna boot and Iodoflex, wound appears better 8/14-Patient presents at 1 week with Iodoflex, we use the Unna boot, wound appears to be stable better.Patient is getting Botox treatment for the inversion of the foot for tendon release, Next week 8/21; we are using Iodoflex. Unna boot. The wound is stable in terms of surface area. Under illumination there is some areas of the wound that appear to be either epithelialized or perhaps this is adherent slough at this point I was not really clear. It did not wipe off and I was reluctant to debride this today. 8/28; we are using Iodoflex in an Unna boot. Seems to be making good improvement. 9/4; using Iodoflex and wound is slightly smaller. 9/18; we are using Iodoflex with topical silver nitrate when she is here. The wound continues to be smaller 10/2; patient missed her appointment last week due to GI issues. She left and Iodoflex based dressing on for 2 weeks. Wound is about the same size about the size of a dime on the left medial lower 10/9 we have been using Iodoflex on the medial left ankle wound. She has a new superficial probable wrap injury on the dorsal left ankle 10/16; we have been using Hydrofera Blue since last week. This is on the left medial ankle 10/23; we have been using Hydrofera Blue since 2 weeks ago. This is on the left medial ankle. Dimensions are better 11/6; using Hydrofera Blue. I think the wound is smaller but still not closed. Left medial ankle 11/13; we have been using Hydrofera Blue. Wound is certainly no smaller this week. Also the surface not as good. This is the remanent of a very large area on her left medial ankle. 11/20; using Sorbact since last week. Wound was about the same in terms of size although I was disappointed about the surface debris 12/11; 3-week follow-up. Patient was on vacation. Wound is measuring  slightly larger we have been using Sorbact. 12/18; wound is about the same size however surface looks better last week after debridement. We have been using Sorbact under compression 1/15 wound is probably twice the size of last time increased in length nonviable surface. We have been using Sorbact. She was running a mild fever and missed her appointment last week 1/22; the wound is come down in size but under illumination still a very adherent debris we have been Hydrofera Blue that I changed her to last week 1/29; dimensions down slightly. We have been using Hydrofera Blue 2/19 dimensions are the same however there is rims of epithelialization under illumination. Therefore more the surface area may be epithelialized 2/26; the patient's wound actually measures smaller. The wound looks healthy. We have been using Hydrofera Blue. I had some thoughts about running Apligraf then I still may do that however this looks so much better this week we will delay that for now 3/5; the wound is small but about the same as last week. We have been using Hydrofera Blue. No debridement is required today. 3/19; the wound is about the size of a dime. Healthy looking wound even under illumination. We have been using Hydrofera Blue. No mechanical debridement is necessary 3/26; not much change from last week although still  looks very healthy. We have been using Hydrofera Blue under Unna boots Patient was offered an ankle fusion by podiatry but not until the wound heals with a proceed with this. 4/9; the patient comes in today with her original wound on the medial ankle looking satisfactory however she has some uncontrolled swelling in the middle part of her leg with 2 new open areas superiorly just lateral to the tibia. I think this was probably a wrap issue. She said she felt uncomfortable during the week but did not call in. We have been using Hydrofera Blue 4/16; the wound on the medial ankle is about the same. She  has innumerable small areas superior to this across her mid tibia. I think this is probably folliculitis. She is also been working in the yard doing a lot of sweating 4/30; the patient issue on the upper areas across her mid tibia of all healed. I think this was excessive yard work if I remember. Her wound on the medial ankle is smaller. Some debris on this we have been using Hydrofera Blue under Unna boots 5/7; mid tibia. She has been using Hydrofera Blue under an Unna wrap. She is apparently going for her ankle surgery on June 3 10/28/19-Patient returns to clinic with the ankle wound, we are using Hydrofera Blue under Unna wrap, surgery is scheduled for her left foot for June 3 so she will be back for nurse visit next week READMISSION 01/17/2020 Mrs. Iovine is a 65 year old woman we have had in this clinic for a long period of time with severe venous hypertension and refractory wounds on her medial lower legs and ankles bilaterally. 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 When she was off her legs for a prolonged period example when she fell and suffered a compression fracture things would heal up quite nicely. She is now retired and we managed to heal up the right medial leg wound. The left one was very tiny last time I saw this although still refractory. 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 It got to the point where this was interfering with her walking and she elected to proceed with a ankle arthrodesis to straighten her her ankle and leave her with a functional outcome for mobilization. The patient was referred to Dr. Doren Custard and really this took some time to arrange. Dr. Doren Custard saw her on 12/07/2019. Once again he verified that she had no arterial issues. She had previously had an  angiogram several years ago. Follow-up ABIs on the left showed an ABI of 1.12 with triphasic waveforms and a TBI of 0.92. She is felt to have chronic deep venous insufficiency but I do not think it was felt that anything could be done from about this from an ablation point of view. At the time Dr. Doren Custard saw this patient the wounds actually look closed via the pictures in his clinic. The patient finally underwent her surgery on 12/15/2019. This went reasonably well and there was a good anatomic outcome. She developed a small distal wound dehiscence on the lateral part of the surgical wound. However more problematically she is developed recurrence of the wound on the medial left ankle. There are actually 2 wounds here one in the distal lower leg and 1 pretty much at the level  of the medial malleolus. It is a more distal area that is more problematic. She has been using Hydrofera Blue which started on Friday before this she was simply Ace wrapping. There was a culture done that showed Pseudomonas and she is on ciprofloxacin. A recent CNS on 8/11 was negative. The patient reports some pain but I generally think this is improving. She is using a cam boot completely nonweightbearing using a walker for pivot transfers and a wheelchair 8/24; not much improvement unfortunately she has a surgical wound on the lateral part in the venous insufficiency wound medially. The bottom part of the medial insufficiency wound is still necrotic there is exposed tendon here. We have been using Hydrofera Blue under compression. Her edema control is however better 8/31; patient in for follow-up of his surgical wound on the lateral part of her left leg and chronic venous insufficiency ulcers medially. We put her back in compression last week. She comes in today with a complaint of 3 or 4 days worth of increasing pain. She felt her cam walker was rubbing on the area on the back of her heel. However there is intense erythema seems  more likely she has cellulitis. She had 2 cultures done when she was seeing podiatry in the postop. One of them in late July showed Pseudomonas and she received a course of ciprofloxacin the other was negative on 8/11 she is allergic to penicillin with anaphylactoid complaints of hives oral swelling via information in epic 9/9; when I saw this patient last week she had intense anterior erythema around her wound on the right lateral heel and ankle and also into the right medial heel. Some of this was no doubt drainage and her walker boot however I was convinced she had cellulitis. I gave her Levaquin and Bactrim she is finishing up on this now. She is following up with Dr. [name] he saw her yesterday. He is taken her out of the walking boot of course she is still nonweightbearing. Her x-ray was negative for any worrisome features such as soft tissue air etc. Things are a lot better this week. She has home health. We have been using Hydrofera Blue under an [employer] which she put back on yesterday. I did not wrap her last week 9/17; her surrounding skin looks a lot better. In fact the area on the left lateral ankle has just a scant amount of eschar. The only remaining wound is the large area on the left medial ankle. Probably about 60% of this is healthy granulation at the surface however she has a significant divot distally. This has adherent debris in it. I been using debridement and silver collagen to try and get this area to fill-in although I do not think we have made much progress this week 9/24; the patient's wound on the left medial ankle looks a lot better. The deeper divot area distally still requires debridement but this is cleaning up quite nicely we have been using silver collagen. The patient is complaining of swelling in her foot and is worried that that is contributing to the nonhealing of the ankle wound. She is also complaining of numbness in her anterior toes 10/4; left medial ankle.  The small area distally still has a divot with necrotic material that I have been debriding away. This has an undermining area. She is approved for Apligraf. She saw Dr. [name] her surgeon on 10/1. I think he declared himself is satisfied with the condition of things. Still nonweightbearing till  the end of the month. 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 Her surgical wound is well closed. There is no evidence of infection 10/11; the patient arrived in clinic today with the expectation that we be able to put an Apligraf on this area after debridement however she arrives with a relatively large amount of green drainage on the dressing. The patient states that this started on Friday. She has not been systemically unwell. 10/19; culture I did last week showed both Enterococcus and Pseudomonas. I think this came in separate parts because I stopped her ciprofloxacin I gave her and prescribed her linezolid however now looking at the final culture result this is Pseudomonas which is resistant to quinolones. She has not yet picked up the linezolid apparently phone issues. We are also trying to get a topical antibiotic out of [facility] in [place] they can be applied by home health. She is still having green drainage 10/16; the patient has her topical antibiotic from [facility] in [place]. This is a compounded gel with vancomycin and ciprofloxacin and gentamicin. We are applying this on the wound bed with silver alginate over the top with Unna boot wraps. She arrives in clinic today with a lot less ominous looking drainage although she is only use this topical preparation once the second time today. She sees Dr. [name] her surgeon on Friday she has home health changing the dressing 11/2; still using her compounded topical antibiotic under silver alginate. Surface is cleaning up there is less drainage. We had an Apligraf for her today and I elected to apply it. A  light coating of her antibiotic 04/25/2020 upon evaluation today patient appears to be doing well with regard to her ankle ulcer. There is a little bit of slough noted on the surface of the wound I am can have to perform sharp debridement to clear this away today. With that being said other than that fact overall I feel like she is making progress and we do see some new epithelial growth. There is also some improvement in the depth of the wound and that distal portion. There is little bit of slough there as well. 12/7; 2-week follow-up. Apligraf #3. Dimensions are smaller. Closing in especially inferiorly. Still some surface debris. Still using the [facility] topical antibiotic but I told her that I don't think this needs to be renewed 12/21; 2-week follow-up. Apligraf #4 dimensions are smaller. Nice improvement 06/05/2020; 2-week follow-up. The patient's wound on the left medial ankle looks really excellent. Nice granulation. Advancing epithelialization no undermining no evidence of infection. We would have to reapply for another Apligraf but with the condition of this wound I did not feel strongly about it. We used Hydrofera Blue under the same degree of compression. She follows up with Dr. [name] her surgeon a week Friday 06/13/2020 upon evaluation today patient appears to be doing excellent in regard to her wound. She has been tolerating the dressing changes without complication. Fortunately there is no signs of active infection at this time. No fevers, chills, nausea, vomiting, or diarrhea. She was using Hydrofera Blue last week. 06/20/2020 06/20/2020 on evaluation today patient actually appears to be doing excellent in regard to her wound. This is measuring better and looking much better as well. She has been using the collagen that seems to be doing better for her as well even though the [facility] was and is not sticking or feeling as rough on her wound. She did see Dr.  Evans on Friday he is very  pleased he also stated none of the hardware has shifted. That is great news 1/27; the patient has a small clean wound all that is remaining. I agree that this is too small to really consider an Apligraf. Under illumination the surface is looking quite good. We have been using collagen although the dimensions are not any better this week 2/2; the patient has a small clean wound on the left medial ankle. Although this left of her substantial original areas. Measurements are smaller. We have been using polymen Ag under an Haematologist. 2/10; small area on the left medial ankle. This looks clean nothing to debride however dimensions are about the same we have been using polymen I think now for 2 weeks 2/17; not much change in surface area. We have been using polymen Ag without any improvement. 3/17; 1 month follow-up. The patient has been using endoform without any improvement in fact I think this looks worse with more depth and more expansion 3/24; no improvement. Perhaps less debris on the surface. We have been using Sorbact for 1 week 4/4; wound measures larger. She has edema in her leg and her foot which she tells as her wrap came down. We have been using Unna boots. Sorbact of the wound. She has been approved for Apligraf 09/12/2020 upon evaluation today patient appears to be doing well with regard to her wound. We did get the Apligraf reapproved which is great news we have that available for application today. Fortunately there is no signs of infection and overall the patient seems to be doing great. The wound bed is nice and clean. 4/27; patient presents for her second application of Apligraf. She states over the past week she has been on her feet more often due to being outside in her garden. She has noted more swelling to her foot as a result. She denies increased warmth, pain or erythema to the wound site. 10/10/2020 upon evaluation today patient appears to be doing well with regard to her wound  which does not appear to be quite as irritated as last week from what I am hearing. With that being said unfortunately she is having issues with some erythema and warmth to touch as well as an increase size. I do believe this likely is infected. 10/17/2020 upon evaluation today patient appears to be doing excellent in regard to her wound this is significantly improved as compared to last week. Fortunately I think that the infection is much better controlled at this point. She did have evidence of both Enterococcus as well as Staphylococcus noted on culture. Enterococcus really would not be helped significantly by the Cipro but the wound is doing so much better I am under the assumption that the Staphylococcus is probably the main organism that is causing the current infection. Nonetheless I think that she is doing excellent as far as that is concerned and I am very pleased in that regard. I would therefore recommend she continue with the Cipro. 10/31/2020 upon evaluation today patient appears to be doing well with regard to her wound. She has been tolerating the dressing changes without complication. Fortunately there is no signs of active infection and overall I am extremely pleased with where things stand today. No fevers, chills, nausea, vomiting, or diarrhea. With that being said she does have some green drainage coming from the wound and although it looks okay I am a little concerned about the possibility of a continuing infection. Specifically with Pseudomonas.  For that reason I will go ahead and send in a prescription for Cipro for her to be continued. 11/14/2020 upon evaluation today patient appears to be doing very well currently in regard to her wound on her leg. She has been tolerating the dressing changes without complication. Fortunately I feel like the infection is finally under good control here. Unfortunately we do not have the Apligraf for application today although we can definitely order  to have it in place for next week. That will be her fifth and final of the current series. Nonetheless I feel like her wound is really doing quite well which is great news. 11/21/2020 upon evaluation today patient appears to be doing well with regard to her wound on the medial ankle. Fortunately I think the infection is under control and I do believe we can go ahead and reapply the Apligraf today. She is in agreement with that plan. There does not appear to be any signs of active infection at this time which is great news. No fevers, chills, nausea, vomiting, or diarrhea. 12/05/2020 upon evaluation today patient's wound bed actually showed signs of good granulation epithelization at this point. There does not appear to be any signs of infection which is great news and overall very pleased with where things stand. Overall the patient seems to be doing fairly well in my opinion with regard to her wound although I do believe she continues to build up a lot of biofilm I think she could benefit from using PuraPly at this point. 12/12/2020 upon evaluation today patient's wound actually appears to be doing decently well today. The Unna boot has not been quite as well-tolerated so that more uncomfortable for her and even causing some pressure over the plate on the lateral portion of her foot which is 90 where the wound is. There did not appear to be any significant deep tissue injury with that there may be a minimal change in the skin noted I think that we may want to go back to the Coflex 2 layer which is a little bit easier on her skin it seems. 12/19/2020 upon evaluation today patient actually seems to be making great progress with the PuraPly currently. She in fact seems to be much better as far as the overall appearance of the wound bed is concerned I am very happy in this regard. I do not see any signs of of infection which is great news as well. No fevers, chills, nausea, vomiting, or diarrhea. 12/26/2020  upon evaluation today patient appears to actually be doing better in regard to her wound on the left medial ankle region. The surface of the wound is actually doing significantly better which is great news. There does not appear to be any signs of infection which is also great news and in general I am extremely pleased with where we stand today. 01/02/2021 upon evaluation today patient appears to be doing well with regard to her wound. In fact this is showing signs of excellent improvement and very pleased with where things stand. In fact the last 3 appointments have all shown signs of this getting smaller which is excellent news. I have not even had to perform any debridement and today is no exception. Overall I feel like this is dramatically improved compared to previous. T oday is PuraPly application #4. 0/63/0160 upon evaluation today patient appears to be doing excellent in regard to her wound this is continue to show signs of improvement and overall I am extremely pleased with  where we stand today. She is actually here for PuraPly application #5. Every time we have applied this she is noted definite improvement on measurements. 01/23/2021 upon evaluation today patient is actually making good progress in regard to her wound. This was actually on just a little bit longer this time compared to previous due to the fact that she did have to go out of town. She is actually here for PuraPly application #6. We have definitely been seeing improvements in the overall quality of the tissue on the surface of the wound which is awesome news. In general I think that the patient seems to be continuing to make great progress here. 01/30/2021 upon evaluation today patient's wound is actually doing excellent. There is really not any significant biofilm buildup which is great news and overall I am extremely pleased with where things stand today. There does not appear to be any signs of active infection. No fevers,  chills, nausea, vomiting, or diarrhea. 02/06/2021 upon evaluation today patient's wound is actually showing signs of excellent improvement which is great news. We continue to see the benefit of the PuraPly this is doing a great job the wound seems not really irritated whatsoever and is showing signs of good granulation at this point. Overall I am extremely happy with what we are seeing. The patient likewise is happy to hear all of this as well. 02/13/2021 upon evaluation today patient appears to be doing well with regard to her wound. This again is measuring smaller today and I am very pleased in this regard. Fortunately there does not appear to be any signs of active infection at this time which is good news from a systemic standpoint. Locally there is still some significant drainage which she does have is concerned about infection locally. No fevers, chills, nausea, vomiting, or diarrhea. 02/20/2021 upon evaluation today patient actually appears to be doing decently well in regard to her wound. She has been tolerating the dressing changes without complication. I do believe the PuraPly is helping wound bed does appear to be doing better. There is no evidence of active infection locally or systemically at this point visually although on fluorescence imaging there still appear to be bacterial load and bioburden noted. 02/27/2021 upon evaluation today patient fortunately continues to show signs of improvement with use of the PuraPly currently. Subsequently we did review her culture results and to be honest I think that the Bactrim is probably the best option to have her continue at this point. For that reason I am get a go ahead and send in a refill today for her for an additional 2 weeks. Nonetheless I think that we are making excellent progress here. It was Enterococcus and Staphylococcus that were noted she is allergic to penicillin so there is not much I can do from the Enterococcus standpoint though the  staff does seem to be sensitive to the Bactrim. 03/06/2021 upon evaluation today patient appears to be doing well with regard to her wound. This is definitely showing signs of improvement which is great news and overall I am extremely pleased with where things stand today. There does not appear to be any signs of active infection which is great news and overall and I do believe that we are headed in the appropriate direction I think the PuraPly is doing an awesome job for her. 03/20/2021 upon evaluation today patient actually appears to be doing well with regard to her wound. We have been using the PuraPly although last week when I  was out of the office they actually just used endoform nonetheless this still seems to be doing great. Fortunately there does not appear to be any signs of active infection which is great news overall I think the patient is making wonderful progress. 03/27/2021 upon evaluation today patient appears to be doing well with regard to her wound in fact there is a little drainage but otherwise her does not appear to be any major signs of open wounds nor infection at this time. Overall I am extremely pleased with where things stand. No fevers, chills, nausea, vomiting, or diarrhea. 04/03/2021 upon evaluation today patient presents for reevaluation here in the clinic and overall she seems to be doing quite well. Fortunately there does not appear to be any signs of active infection which is great news and in general I am very pleased with how things appear. There is still a very small opening remaining but in general she is looking much better. Fortunately I do not think there is any need for the PuraPly at this point. 04/10/2021 upon evaluation today patient's wound is actually showing signs of excellent improvement. I am actually very pleased with where it stands today. I think she is very close to complete closure in fact this may be healed today. Either way I think that she may  benefit from using her own compression socks for the next week and then will subsequently see how things stand following. 04/17/2021 upon evaluation today patient appears to be doing well with regard to her wound on the ankle region. This is actually showing signs of excellent improvement which is great news. I do not see any signs of active infection at this time which is great and no open wound. With that being said on the lateral portion of her ankle right at the base of her plate she actually has an area of pressure that occurred over the past week when she was not in the wrap or we been padding her. This does seem to be an issue which I think is good to be an ongoing problem I am can actually get in touch with Dr. [name] today to see what we can do in that regard. 04/24/2021 upon evaluation today patient appears to be doing well with regard to her ankle region for the most part although there does appear to be some fluid trapped underneath the area where we thought this was healed last week. Unfortunately I think at this time that we can have to reopen and clean away some of this area and then subsequently depending on how things do over the next week or so we will see about discharge but right now I do not think that is the right way to go. She voiced understanding. 05/01/2021 upon evaluation today patient's wound still appears to be open unfortunately. I do believe that we do need to see what we can do about trying to get this closed I believe compression wrapping is probably can to be indicated here. Fortunately there is no signs of active infection at this time. Objective Constitutional Well-nourished and well-hydrated in no acute distress. Vitals Time Taken: 10:40 AM, Height: 68 in, Source: Stated, Source: Stated, Temperature: 98.1 F, Pulse: 61 bpm, Respiratory Rate: 18 breaths/min, Blood Pressure: 101/58 mmHg. Respiratory normal breathing without difficulty. Psychiatric this patient  is able to make decisions and demonstrates good insight into disease process. Alert and Oriented x 3. pleasant and cooperative. General Notes: Upon inspection patient's wound bed showed signs of good granulation  and epithelization at this point. I do not see any evidence of active infection locally nor systemically currently and this overall is great news. No fevers, chills, nausea, vomiting, or diarrhea. Integumentary (Hair, Skin) Wound #15R status is Open. Original cause of wound was Gradually Appeared. The date acquired was: 12/30/2019. The wound has been in treatment 67 weeks. The wound is located on the Left,Medial Malleolus. The wound measures 0.7cm length x 0.6cm width x 0.1cm depth; 0.33cm^2 area and 0.033cm^3 volume. There is Fat Layer (Subcutaneous Tissue) exposed. There is no tunneling or undermining noted. There is a medium amount of serous drainage noted. The wound margin is indistinct and nonvisible. There is large (67-100%) pink granulation within the wound bed. There is no necrotic tissue within the wound bed. 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 There was a Double Layer Compression Therapy Procedure by Baruch Gouty, RN. Post procedure Diagnosis Wound #15R: Same as Pre-Procedure Plan Follow-up Appointments: Return Appointment in 1 week. - with Shyteria Lewis Edema Control - Lymphedema / SCD / Other: Patient to wear own compression stockings every day. Exercise regularly Moisturize legs daily. Non Wound Condition: Apply the following to affected area as directed: - Apply silicone bordered foam to left lateral foot for  protection until your f/u with Dr. [name] WOUND #15R: - Malleolus Wound Laterality: Left, Medial Cleanser: Wound Cleanser (Generic) 1 x Per Day/15 Days Discharge Instructions: Cleanse the wound with wound cleanser prior to applying a clean dressing using gauze sponges, not tissue or cotton balls. Prim Dressing: Maxorb Extra Calcium Alginate 2x2 in (Generic) 1 x Per Day/15 Days ary Discharge Instructions: Apply calcium alginate to wound bed as instructed Secondary Dressing: Woven Gauze Sponge, Non-Sterile 4x4 in (Generic) 1 x Per Day/15 Days Discharge Instructions: Apply over primary dressing as directed. Com pression Wrap: CoFlex TLC XL 2-layer Compression System 4x7 (in/yd) 1 x Per Day/15 Days Discharge Instructions: Apply CoFlex 2-layer compression as directed. (alt for 4 layer) 1. Would recommend that we go ahead and continue with the wound care measures as before. I do think that the alginate is probably the best way to go we will just initiate the compression wrapping again and try to see if we can get the edema under better control. 2. I am also can recommend that we go back to the 2 layer Coflex compression wrap which does well for her. We will see patient back for reevaluation in 1 week here in the clinic. If anything worsens or changes patient will contact our office for additional recommendations. Electronic Signature(s) Signed: 05/01/2021 5:56:31 PM By: [name] PA-C Entered By: [name] on 05/01/2021 17:56:30 -------------------------------------------------------------------------------- SuperBill Details Patient Name: Date of Service: [facility] [name], [name] 05/01/2021 Medical Record Number: [id number] Patient Account Number: [id number] Date of Birth/Sex: Treating RN: 10/30/1955 (65 y.o. [name] Provider: [name] Other Clinician: Referring Provider: Treating Provider/Extender: [name] in Treatment:  25 Diagnosis Coding ICD-10 Codes Code Description [phone] Chronic venous hypertension (idiopathic) with ulcer and inflammation of left lower extremity L97.828 Non-pressure chronic ulcer of other part of left lower leg with other specified severity L97.328 Non-pressure chronic ulcer of left ankle  with other specified severity Facility Procedures Physician Procedures : CPT4 Code Description Modifier [id number] - WC PHYS LEVEL 4 - EST PT ICD-10 Diagnosis Description I87.332 Chronic venous hypertension (idiopathic) with ulcer and inflammation of left lower extremity L97.828 Non-pressure chronic ulcer of other part  of left lower leg with other specified severity L97.328 Non-pressure chronic ulcer of left ankle with other specified severity Quantity: 1 Electronic Signature(s) Signed: 05/01/2021 5:57:08 PM By: [name] PA-C Previous Signature: 05/01/2021 4:52:58 PM Version By: Baruch Gouty RN, BSN Entered By: [name] on 05/01/2021 17:57:08

## 2021-05-08 ENCOUNTER — Other Ambulatory Visit: Payer: Self-pay

## 2021-05-08 ENCOUNTER — Encounter (HOSPITAL_BASED_OUTPATIENT_CLINIC_OR_DEPARTMENT_OTHER): Payer: Medicare Other | Attending: Physician Assistant | Admitting: Physician Assistant

## 2021-05-08 DIAGNOSIS — L97828 Non-pressure chronic ulcer of other part of left lower leg with other specified severity: Secondary | ICD-10-CM | POA: Diagnosis not present

## 2021-05-08 DIAGNOSIS — L97328 Non-pressure chronic ulcer of left ankle with other specified severity: Secondary | ICD-10-CM | POA: Insufficient documentation

## 2021-05-08 NOTE — Progress Notes (Signed)
BRITTNAY, PIGMAN ([id number]) Visit Report for 05/08/2021 Arrival Information Details Patient Name: Date of Service: [place] [name], [name] 05/08/2021 10:30 A M Medical Record Number: [id number] Patient Account Number: [id number] Date of Birth/Sex: Treating RN: [birth year] (65 y.o. [name] Kynedi Profitt: [name] Other Clinician: Referring Nami Strawder: Treating Rayn Enderson/Extender: [name] in Treatment: 20 Visit Information History Since Last Visit Added or deleted any medications: No Patient Arrived: Ambulatory Any new allergies or adverse reactions: No Arrival Time: 10:29 Had a fall or experienced change in No Accompanied By: self activities of daily living that may affect Transfer Assistance: None risk of falls: Patient Identification Verified: Yes Signs or symptoms of abuse/neglect since last visito No Secondary Verification Process Completed: Yes Hospitalized since last visit: No Patient Requires Transmission-Based Precautions: No Implantable device outside of the clinic excluding No Patient Has Alerts: Yes cellular tissue based products placed in the center Patient Alerts: L ABI =1.12, TBI = .92 since last visit: R ABI= 1.02, TBI= .58 Has Dressing in Place as Prescribed: Yes Has Compression in Place as Prescribed: Yes Pain Present Now: Yes Electronic Signature(s) Signed: 05/08/2021 4:49:37 PM By: Baruch Gouty RN, BSN Entered By: Baruch Gouty on 05/08/2021 10:30:33 -------------------------------------------------------------------------------- Compression Therapy Details Patient Name: Date of Service: [name], [name] 05/08/2021 10:30 A M Medical Record Number: [id number] Patient Account Number: [id number] Date of Birth/Sex: Treating RN: [birth year] (65 y.o. [name] Marielena Harvell: [name] Other Clinician: Referring Shonette Rhames: Treating Toryn Dewalt/Extender: [name] in  Treatment: 77 Compression Therapy Performed for Wound Assessment: Wound #15R Left,Medial Malleolus Performed By: Clinician Baruch Gouty, RN Compression Type: Double Layer Post Procedure Diagnosis Same as Pre-procedure Electronic Signature(s) Signed: 05/08/2021 4:49:37 PM By: Baruch Gouty RN, BSN Entered By: Baruch Gouty on 05/08/2021 10:59:47 -------------------------------------------------------------------------------- Encounter Discharge Information Details Patient Name: Date of Service: [name], [name] 05/08/2021 10:30 A M Medical Record Number: [id number] Patient Account Number: [id number] Date of Birth/Sex: Treating RN: 05-Dec-1955 (65 y.o. [name] Ryka Beighley: [name] Other Clinician: Referring Lamone Ferrelli: Treating Corianne Buccellato/Extender: [name] in Treatment: 81 Encounter Discharge Information Items Discharge Condition: Stable Ambulatory Status: Ambulatory Discharge Destination: Home Transportation: Private Auto Accompanied By: self Schedule Follow-up Appointment: Yes Clinical Summary of Care: Patient Declined Electronic Signature(s) Signed: 05/08/2021 4:49:37 PM By: Baruch Gouty RN, BSN Entered By: Baruch Gouty on 05/08/2021 11:14:46 -------------------------------------------------------------------------------- Lower Extremity Assessment Details Patient Name: Date of Service: [facility] [name], [name] 05/08/2021 10:30 A M Medical Record Number: [id number] Patient Account Number: [id number] Date of Birth/Sex: Treating RN: 1956-01-13 (65 y.o. [name] Khalif Stender: [name] Other Clinician: Referring Longino Trefz: Treating Dalaney Needle/Extender: [name] in Treatment: 72 Edema Assessment Assessed: [Left: No] [Right: No] Edema: [Left: N] [Right: o] Calf Left: Right: Point of Measurement: 31 cm From Medial Instep 27 cm Ankle Left: Right: Point of  Measurement: 11 cm From Medial Instep 21.5 cm Vascular Assessment Pulses: Dorsalis Pedis Palpable: [Left:Yes] Electronic Signature(s) Signed: 05/08/2021 4:49:37 PM By: Baruch Gouty RN, BSN Entered By: Baruch Gouty on 05/08/2021 10:35:38 -------------------------------------------------------------------------------- Multi-Disciplinary Care Plan Details Patient Name: Date of Service: [name], [name] 05/08/2021 10:30 A M Medical Record Number: [id number] Patient Account Number: [id number] Date of Birth/Sex: Treating RN: 1956-03-11 (65 y.o. [name] Debbera Wolken: [name] Other Clinician: Referring [employer] Belay: Treating Rhemi Balbach/Extender: Clementeen Graham,  Noelle Weeks in Treatment: 39 Multidisciplinary Care Plan reviewed with physician Active Inactive Venous Leg Ulcer Nursing Diagnoses: Actual venous Insuffiency (use after diagnosis is confirmed) Knowledge deficit related to disease process and management Goals: Patient will maintain optimal edema control Date Initiated: 06/20/2020 Target Resolution Date: 05/29/2021 Goal Status: Active Interventions: Assess peripheral edema status every visit. Compression as ordered Treatment Activities: Therapeutic compression applied : 06/20/2020 Notes: 01/30/21: Edema control ongoing, using CoFlex wrap. Wound/Skin Impairment Nursing Diagnoses: Impaired tissue integrity Knowledge deficit related to ulceration/compromised skin integrity Goals: Patient/caregiver will verbalize understanding of skin care regimen Date Initiated: 01/17/2020 Target Resolution Date: 05/29/2021 Goal Status: Active Ulcer/skin breakdown will have a volume reduction of 30% by week 4 Date Initiated: 01/17/2020 Date Inactivated: 03/12/2020 Target Resolution Date: 03/09/2020 Goal Status: Met Interventions: Assess patient/caregiver ability to obtain necessary supplies Assess patient/caregiver ability to perform ulcer/skin care  regimen upon admission and as needed Assess ulceration(s) every visit Provide education on ulcer and skin care Notes: 01/30/21: Wound care regimen continues, PuraPly skin sub being used. 04/17/21: Wound healed Electronic Signature(s) Signed: 05/08/2021 4:49:37 PM By: Baruch Gouty RN, BSN Entered By: Baruch Gouty on 05/08/2021 10:47:22 -------------------------------------------------------------------------------- Pain Assessment Details Patient Name: Date of Service: [name], [name] 05/08/2021 10:30 A M Medical Record Number: [id number] Patient Account Number: [id number] Date of Birth/Sex: Treating RN: June 16, 1955 (66 y.o. [name] Xitlaly Ault: [name] Other Clinician: Referring Johnette Teigen: Treating Estanislado Surgeon/Extender: [name] in Treatment: 53 Active Problems Location of Pain Severity and Description of Pain Patient Has Paino Yes Site Locations Pain Location: Pain in Ulcers With Dressing Change: Yes Duration of the Pain. Constant / Intermittento Intermittent Rate the pain. Current Pain Level: 3 Character of Pain Describe the Pain: Aching Pain Management and Medication Current Pain Management: Other: tolerable Is the Current Pain Management Adequate: Adequate How does your wound impact your activities of daily livingo Sleep: No Bathing: No Appetite: No Relationship With Others: No Bladder Continence: No Emotions: No Bowel Continence: No Work: No Toileting: No Drive: No Dressing: No Hobbies: No Electronic Signature(s) Signed: 05/08/2021 4:49:37 PM By: Baruch Gouty RN, BSN Entered By: Baruch Gouty on 05/08/2021 10:32:16 -------------------------------------------------------------------------------- Patient/Caregiver Education Details Patient Name: Date of Service: [name], [name] 12/7/2022andnbsp10:30 A M Medical Record Number: [id number] Patient Account Number: [id number] Date of  Birth/Gender: Treating RN: 10-19-1955 (65 y.o. [name] Physician: [name] Other Clinician: Referring Physician: Treating Physician/Extender: [name] in Treatment: 87 Education Assessment Education Provided To: Patient Education Topics Provided Venous: Methods: Explain/Verbal Responses: Reinforcements needed, State content correctly Wound/Skin Impairment: Methods: Explain/Verbal Responses: Reinforcements needed, State content correctly Electronic Signature(s) Signed: 05/08/2021 4:49:37 PM By: Baruch Gouty RN, BSN Entered By: Baruch Gouty on 05/08/2021 10:48:02 -------------------------------------------------------------------------------- Wound Assessment Details Patient Name: Date of Service: [name], [name] 05/08/2021 10:30 A M Medical Record Number: [id number] Patient Account Number: [id number] Date of Birth/Sex: Treating RN: 04-25-1956 (65 y.o. [name] Kaliq Lege: [name] Other Clinician: Referring Trica Usery: Treating Haru Anspaugh/Extender: [name] in Treatment: 32 Wound Status Wound Number: [id number] Primary Venous Leg Ulcer Etiology: Wound Location: Left, Medial Malleolus Wound Status: Open Wounding Event: Gradually Appeared Comorbid Peripheral Venous Disease, Osteoarthritis, Confinement Date Acquired: 12/30/2019 History: Anxiety Weeks Of Treatment: 68 Clustered Wound: No Wound Measurements Length: (cm) 1.3 Width: (cm) 0.8 Depth: (cm) 0.1 Area: (cm) 0.817 Volume: (cm) 0.082 % Reduction in Area: 93% %  Reduction in Volume: 99.1% Epithelialization: Small (1-33%) Tunneling: No Undermining: No Wound Description Classification: Full Thickness With Exposed Support Structures Wound Margin: Flat and Intact Exudate Amount: Medium Exudate Type: Serous Exudate Color: amber Foul Odor After Cleansing: No Slough/Fibrino No Wound Bed Granulation  Amount: Large (67-100%) Exposed Structure Granulation Quality: Red, Pink Fascia Exposed: No Necrotic Amount: None Present (0%) Fat Layer (Subcutaneous Tissue) Exposed: Yes Tendon Exposed: No Muscle Exposed: No Joint Exposed: No Bone Exposed: No Electronic Signature(s) Signed: 05/08/2021 4:49:37 PM By: Baruch Gouty RN, BSN Entered By: Baruch Gouty on 05/08/2021 10:46:41 -------------------------------------------------------------------------------- Wound Assessment Details Patient Name: Date of Service: [name], [name] 05/08/2021 10:30 A M Medical Record Number: [id number] Patient Account Number: [id number] Date of Birth/Sex: Treating RN: 02-07-1956 (65 y.o. [name] Dieter Hane: [name] Other Clinician: Referring Krishan Mcbreen: Treating Irja Wheless/Extender: [name] in Treatment: 70 Wound Status Wound Number: 15R Primary Venous Leg Ulcer Etiology: Wound Location: Left, Medial Malleolus Wound Status: Open Wounding Event: Gradually Appeared Comorbid Peripheral Venous Disease, Osteoarthritis, Confinement Date Acquired: 12/30/2019 History: Anxiety Weeks Of Treatment: 68 Clustered Wound: No Photos Wound Measurements Length: (cm) 1.3 Width: (cm) 0.8 Depth: (cm) 0.1 Area: (cm) 0.817 Volume: (cm) 0.082 % Reduction in Area: 93% % Reduction in Volume: 99.1% Epithelialization: Small (1-33%) Tunneling: No Undermining: No Wound Description Classification: Full Thickness With Exposed Support Structures Wound Margin: Flat and Intact Exudate Amount: Medium Exudate Type: Serous Exudate Color: amber Foul Odor After Cleansing: No Slough/Fibrino No Wound Bed Granulation Amount: Large (67-100%) Exposed Structure Granulation Quality: Red, Pink Fascia Exposed: No Necrotic Amount: None Present (0%) Fat Layer (Subcutaneous Tissue) Exposed: Yes Tendon Exposed: No Muscle Exposed: No Joint Exposed: No Bone Exposed:  No Treatment Notes Wound #15R (Malleolus) Wound Laterality: Left, Medial Cleanser Wound Cleanser Discharge Instruction: Cleanse the wound with wound cleanser prior to applying a clean dressing using gauze sponges, not tissue or cotton balls. Peri-Wound Care Topical Primary Dressing Promogran Prisma Matrix, 4.34 (sq in) (silver collagen) Discharge Instruction: Moisten collagen with saline or hydrogel Secondary Dressing Woven Gauze Sponge, Non-Sterile 4x4 in Discharge Instruction: Apply over primary dressing as directed. Secured With Compression Wrap CoFlex TLC XL 2-layer Compression System 4x7 (in/yd) Discharge Instruction: Apply CoFlex 2-layer compression as directed. (alt for 4 layer) Compression Stockings Add-Ons Electronic Signature(s) Signed: 05/08/2021 4:49:37 PM By: Baruch Gouty RN, BSN Entered By: Baruch Gouty on 05/08/2021 10:47:10 -------------------------------------------------------------------------------- Vitals Details Patient Name: Date of Service: [name], [name] 05/08/2021 10:30 A M Medical Record Number: [id number] Patient Account Number: [id number] Date of Birth/Sex: Treating RN: [birth year] (65 y.o. [name] Katricia Prehn: [name] Other Clinician: Referring Kaliq Lege: Treating Christobal Morado/Extender: [name] in Treatment: 85 Vital Signs Time Taken: 10:30 Temperature (F): 98.1 Height (in): 68 Pulse (bpm): 55 Respiratory Rate (breaths/min): 18 Blood Pressure (mmHg): 130/72 Reference Range: 80 - 120 mg / dl Electronic Signature(s) Signed: 05/08/2021 4:49:37 PM By: Baruch Gouty RN, BSN Entered By: Baruch Gouty on 05/08/2021 10:31:22

## 2021-05-08 NOTE — Progress Notes (Addendum)
DARYN, PISANI ([id number]) Visit Report for 05/08/2021 Chief Complaint Document Details Patient Name: Date of Service: [facility] [name], [name] 05/08/2021 10:30 A M Medical Record Number: [id number] Patient Account Number: [id number] Date of Birth/Sex: Treating RN: 1955/09/21 (65 y.o. [name] Provider: [name] Other Clinician: Referring Provider: Treating Provider/Extender: [name] in Treatment: 9 Information Obtained from: Patient Chief Complaint patient is been followed long-term in this clinic for venous insufficiency ulcers with inflammation, hypertension and ulceration over the medial ankle bilaterally. 01/17/2020; this is a patient who is here for review of postoperative wounds on the left lateral ankle and recurrence of venous stasis ulceration on the left medial Electronic Signature(s) Signed: 05/08/2021 10:56:08 AM By: [name] PA-C Entered By: [name] on 05/08/2021 10:56:07 -------------------------------------------------------------------------------- HPI Details Patient Name: Date of Service: [name], [name] 05/08/2021 10:30 A M Medical Record Number: [id number] Patient Account Number: [id number] Date of Birth/Sex: Treating RN: [birth year] (65 y.o. [name] Provider: [name] Other Clinician: Referring Provider: Treating Provider/Extender: [name] in Treatment: 24 History of Present Illness HPI Description: the remaining wound is over the left medial ankle. Similar wound over the right medial ankle healed largely with use of Apligraf. Most recently we have been using Hydrofera Blue over this wound with considerable improvement. The patient has been extensively worked up in the past for her venous insufficiency and she is not a candidate for antireflux surgery although I have none of the details available currently. 08/24/14; considerable  improvement today. About 50% of this wound areas now epithelialized. The base of the wound appears to be healthier granulation.as opposed to last week when she had deteriorated a considerable improvement 08/17/14; unfortunately the wound has regressed somewhat. The areas of epithelialization from the superior aspect are not nearly as healthy as they were last week. The patient thinks her Hydrofera Blue slipped. 09/07/14; unfortunately the area has markedly regressed in the 2 weeks since I've seen this. There is an odor surrounding erythema. The healthy granulation tissue that we had at the base of the wound now is a dusky color. The nurse reports green drainage 09/14/14; the area looks somewhat better than last week. There is less erythema and less drainage. The culture I did did not show any growth. Nevertheless I think it is better to continue the Cipro and doxycycline for a further week. The remaining wound area was debridement. 09/21/14. Wound did not require debridement last week. Still less erythema and less drainage. She can complete her antibiotics. The areas of epithelialization in the superior aspect of the wound do not look as healthy as they did some weeks ago 10/05/14 continued improvement in the condition of this wound. There is advancing epithelialization. Less aggressive debridement required 10/19/14 continued improvement in the condition and volume of this wound. Less aggressive debridement to the inferior part of this to remove surface slough and fibrinous eschar 11/02/14 no debridement is required. The surface granulation appears healthy although some of her islands of epithelialization seem to have regressed. No evidence of infection 11/16/14; lites surface debridement done of surface eschar. The wound does not look to be unhealthy. No evidence of infection. Unfortunately the patient has had podiatry issues in the right foot and for some reason has redeveloped small surface ulcerations in  the medial right ankle. Her original presentation involved wounds in this area 11/23/14 no debridement. The area on the  right ankle has enlarged. The left ankle wound appears stable in terms of the surface although there is periwound inflammation. There has been regression in the amount of new skin 11/30/14 no debridement. Both wound areas appear healthy. There was no evidence of infection. The the new area on the right medial ankle has enlarged although that both the surfaces appear to be stable. 12/07/14; Debridement of the right medial ankle wound. No no debridement was done on the left. 12/14/14 no major change in and now bilateral medial ankle wounds. Both of these are very painful but the no overt evidence of infection. She has had previous venous ablation 12/21/14; patient states that her right medial ankle wound is considerably more painful last week than usual. Her left is also somewhat painful. She could not tolerate debridement. The right medial ankle wound has fibrinous surface eschar 12/28/14 this is a patient with severe bilateral venous insufficiency ulcers. For a considerable period of time we actually had the one on the right medial ankle healed however this recently opened up again in June. The left medial ankle wound has been a refractory area with some absent flows. We had some success with Hydrofera Blue on this area and it literally closed by 50% however it is recently opened up Foley. Both of these were debridement today of surface eschar. She tolerates this poorly 01/25/15: No change in the status of this. Thick adherent escar. Very poor tolerance of any attempt at debridement. I had healed the right medial malleolus wound for a considerable amount of time and had the left one down to about 50% of the volume although this is totally regressed over the last 48 weeks. Further the right leg has reopened. she is trying to make a appointment with pain and vascular, previous ablations with  Dr. Aleda Grana. I do not believe there is an arterial insufficiency issue here 02/01/15 the status of the adherent eschar bilaterally is actually improved. No debridement was done. She did not manage to get vascular studies done 02/08/15 continued debridement of the area was done today. The slough is less adherent and comes off with less pressure. There is no surrounding infection peripheral pulses are intact 02/15/15 selective debridement with a disposable curette. Again the slough is less adherent and comes off with less difficulty. No surrounding infection peripheral pulses are intact. 02/22/15 selective debridement of the right medial ankle wound. Slough comes off with less difficulty. No obvious surrounding infection peripheral pulses are intact I did not debridement the one on the left. Both of these are stable to improved 03/01/15 selective debridement of both wound areas using a curette to. Adherent slough cup soft with less difficulty. No obvious surrounding infection. The patient tells me that 2 days ago she noted a rash above the right leg wrap. She did not have this on her lower legs when she change this over she arrives with widespread left greater than right almost folliculitis-looking rash which is extremely pruritic. I don't see anything to culture here. There is no rash on the rest of her body. She feels well systemically. 03/08/15; selective debridement of both wounds using a curette. Base of this does not look unhealthy. She had limegreen drainage coming out of the left leg wound and describes a lot of drainage. The rash on her left leg looks improved to. No cultures were done. 03/22/15; patient was not here last week. Basal wounds does not look healthy and there is no surrounding erythema. No drainage. There is still a  rash on the left leg that almost looks vasculitic however it is clearly limited to the top of where the wrap would be. 04/05/15; on the right required a surgical  debridement of surface eschar and necrotic subcutaneous tissue. I did not debridement the area on the left. These continue to be large open wounds that are not changing that much. We were successful at one point in healing the area on the right, and at the same time the area on the left was roughly half the size of current measurements. I think a lot of the deterioration has to do with the prolonged time the patient is on her feet at work 04/19/15 I attempted-like surface debridement bilaterally she does not tolerate this. She tells me that she was in allergic care yesterday with extreme pain over her left lateral malleolus/ankle and was told that she has an "sprain" 05/03/15; large bilateral venous insufficiency wounds over the medial malleolus/medial aspect of her ankles. She complains of copious amounts of drainage and his usual large amounts of pain. There is some increasing erythema around the wound on the right extending into the medial aspect of her foot to. historically she came in with these wounds the right one healed and the left one came down to roughly half its current size however the right one is reopened and the left is expanded. 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 05/10/15 large bilateral venous insufficiency wounds. There is less adherence surface left however the surface culture that I did last week grew pseudomonas therefore bilateral selective debridement score necessary. There is surrounding erythema. The patient describes severe bilateral drainage and a lot of pain in the left ankle. Apparently her podiatrist was were ready to do a cortisone shot 05/17/15; the patient complains of pain and again copious amounts of drainage. 05/24/15; we used Iodo flex last week. Patient notes considerable improvement in wound drainage. Only needed to change this once. 05/31/15; we continued Iodoflex; the base of these large wounds  bilaterally is not too bad but there is probably likely a significant bioburden here. I would like to debridement just doesn't tolerate it. 06/06/14 I would like to continue the Iodoflex although she still hasn't managed to obtain supplies. She has bilateral medial malleoli or large wounds which are mostly superficial. Both of them are covered circumferentially with some nonviable fibrinous slough although she tolerates debridement very poorly. She apparently has an appointment for an ablation on the right leg by interventional radiology. 06/14/15; the patient arrives with the wounds and static condition. We attempted a debridement although she does not do well with this secondary to pain. I 07/05/15; wounds are not much smaller however there appears to be a cleaner granulating base. The left has tight fibrinous slough greater than the right. Debridement is tolerated poorly due to pain. Iodoflex is done more for these wounds in any of the multitude of different dressings I have tried on the left 1 and then subsequently the right. 07/12/15; no change in the condition of this wound. I am able to do an aggressive debridement on the right but not the left. She simply cannot tolerate it. We have been using Iodoflex which helps somewhat. It is worthwhile remembering that at one point we healed the right medial ankle wound and the left was about 25% of the current circumference. We have suggested returning to vascular surgery for review of possible further ablations for  one reason or another she has not been able to do this. 07/26/15 no major change in the condition of either wound on her medial ankle. I did not attempt to debridement of these. She has been aggressively scrubbing these while she is in the shower at home. She has her supply of Iodoflex which seems to have done more for these wounds then anything I have put on recently. 08/09/15 wound area appears larger although not verified by measurements. Using  Iodoflex 09/05/2015 -- she was here for avisit today but had significant problems with the wound and I was asked to see her for a physician opinion. I have summarize that this lady has had surgery on her left lower extremity about 10 years ago where the possible veins stripping was done. She has had an opinion from interventional radiology around November 2016 where no further sclerotherapy was ordered. The patient works 12 hours a day and stands on a concrete floor with work boots and is unable to get the proper compression she requires and cannot elevate her limbs appropriately at any given time. She has recently grown Pseudomonas from her wound culture but has not started her ciprofloxacin which was called in for her. 09/13/15 this continues to be a difficult situation for this patient. At one point I had this wound down to a 1.5 x 1.5" wound on her left leg. This is deteriorated and the right leg has reopened. She now has substantial wounds on her medial calcaneus, malleoli and into her lower leg. One on the left has surface eschar but these are far too painful for me to debridement here. She has a vascular surgery appointment next week to see if anything can be done to help here. I think she has had previous ablations several years ago at [facility]. She has no major edema. She tells me that she did not get product last time [facility] and went for several days without it. She continues to work in work boots 12 hours a day. She cannot get compression/4-layer under her work boots. 09/20/15 no major change. Periwound edema control was not very good. Her point with pain and vascular is next Wednesday the 25th 09/28/15; the patient is seen vascular surgery and is apparently scheduled for repeat duplex ultrasounds of her bilateral lower legs next week. 10/05/15; the patient was seen by Dr. Doren Custard of vascular surgery. He feels that she should have arterial insufficiency excluded as cause/contributed to  her nonhealing stage she is therefore booked for an arteriogram. She has apparently monophasic signals in the dorsalis pedis pulses. She also of course has known severe chronic venous insufficiency with previous procedures as noted previously. I had another long discussion with the patient today about her continuing to work 12 hour shifts. I've written her out for 2 months area had concerns about this as her work location is currently undergoing significant turmoil and this may lead to her termination. She is aware of this however I agree with her that she simply cannot continue to stand for 12 hours multiple days a week with the substantial wound areas she has. 10/19/15; the Dr. Doren Custard appointment was largely for an arteriogram which was normal. She does not have an arterial issue. He didn't make a comment about her chronic venous insufficiency for which she has had previous ablations. Presumably it was not felt that anything additional could be done. The patient is now out of work as I prescribed 2 weeks ago. Her wounds look somewhat less  aggravated presumably because of this. I felt I would give debridement another try today 10/25/15; no major change in this patient's wounds. We are struggling to get her product that she can afford into her own home through her insurance. 11/01/15; no major change in the patient's wounds. I have been using silver alginate as the most affordable product. I spoke to Dr. Marla Roe last week with her requested take her to the OR for surgical debridement and placement of ACEL. Dr. Marla Roe told me that she would be willing to do this however [facility] will not cover this, fortunately the patient has [place] healthcare of some variant 11/08/15; no major change in the patient's wounds. She has been completely nonviable surface that this but is in too much pain with any attempted debridement are clinic. I have arranged for her to see Dr. Marla Roe ham of plastic  surgery and this appointment is on Monday. I am hopeful that they will take her to the OR for debridement, possible ACEL ultimately possible skin graft 11/22/15 no major change in the patient's wounds over her bilateral medial calcaneus medial malleolus into the lower legs. Surface on these does not look too bad however on the left there is surrounding erythema and tenderness. This may be cellulitis or could him sleepy tinea. 11/29/15; no major changes in the patient's wounds over her bilateral medial malleolus. There is no infection here and I don't think any additional antibiotics are necessary. There is now plan to move forward. She sees Dr. Marla Roe in a week's time for preparation for operative debridement and ACEL placement I believe on 7/12. She then has a follow-up appointment with Dr. Marla Roe on 7/21 12/28/15; the patient returns today having been taken to the [facility] by Dr. Marla Roe 12/12/15 she underwent debridement, intraoperative cultures [which were negative]. She had placement of a wound VAC. Parent really ACEL was not available to be placed. The wound VAC foam apparently adhered to the wound since then she's been using silver alginate, Xeroform under Ace wraps. She still says there is a lot of drainage and a lot of pain 01/31/16; this is a patient I see monthly. I had referred her to Dr. Marla Roe him of plastic surgery for large wounds on her bilateral medial ankles. She has been to the OR twice once in early July and once in early August. She tells me over the last 3 weeks she has been using the wound VAC with ACEL underneath it. On the right we've simply been using silver alginate. Under Kerlix Coban wraps. 02/28/16; this is a patient I'm currently seeing monthly. She is gone on to have a skin graft over her large venous insufficiency ulcer on the left medial ankle. This was done by Dr. Marla Roe him. The patient is a bit perturbed about why she didn't have one on her right medial  ankle wound. She has been using silver alginate to this. 03/06/16; I received a phone call from her plastic surgery Dr. Marla Roe. She expressed some concern about the viability of the skin graft she did on the left medial ankle wound. Asked me to place Endoform on this. She told me she is not planning to do a subsequent skin graft on the right as the left one did not take very well. I had placed Hydrofera Blue on the right 03/13/16; continue to have a reasonably healthy wound on the right medial ankle. Down to 3 mm in terms of size. There is epithelialization here. The area on the  left medial ankle is her skin graft site. I suppose the last week this looks somewhat better. She has an open area inferiorly however in the center there appears to be some viable tissue. There is a lot of surface callus and eschar that will eventually need to come off however none of this looked to be infected. Patient states that the is able to keep the dressing on for several days which is an improvement. 03/20/16 no major change in the circumference of either wound however on the left side the patient was at Dr. Eusebio Friendly office and they did a debridement of left wound. 50% of the wound seems to be epithelialized. I been using Endoform on the left Hydrofera Blue in the right 03/27/16; she arrives today with her wound is not looking as healthy as they did last week. The area on the right clearly has an adherent surface to this a very similar surface on the left. Unfortunately for this patient this is all too familiar problem. Clearly the Endoform is not working and will need to change that today that has some potential to help this surface. She does not tolerate debridement in this clinic very well. She is changing the dressing wants 04/03/16; patient arrives with the wounds looking somewhat better especially on the right. Dr. Migdalia Dk change the dressing to silver alginate when she saw her on Monday and also sold her  some compression socks. The usefulness of the latter is really not clear and woman with severely draining wounds. 04/10/16; the patient is doing a bit of an experiment wearing the compression stockings that Dr. Migdalia Dk provided her to her left leg and the out of legs based dressings that we provided to the right. 05/01/16; the patient is continuing to wear compression stockings Dr. Migdalia Dk provided her on the left that are apparently silver impregnated. She has been using Iodoflex to the right leg wound. Still a moderate amount of drainage, when she leaves here the wraps only last for 4 days. She has to change the stocking on the left leg every night 05/15/16; she is now using compression stockings bilaterally provided by Dr. Marla Roe. She is wearing a nonadherent layer over the wounds so really I don't think there is anything specific being done to this now. She has some reduction on the left wound. The right is stable. I think all healing here is being done without a specific dressing 06/09/16; patient arrives here today with not much change in the wound certainly in diameter to large circular wounds over the medial aspect of her ankle bilaterally. Under the light of these services are certainly not viable for healing. There is no evidence of surrounding infection. She is wearing compression stockings with some sort of silver impregnation as prescribed by Dr. Marla Roe. She has a follow-up with her tomorrow. 06/30/16; no major change in the size or condition of her wounds. These are still probably covered with a nonviable surface. She is using only her purchase stockings. She did see Dr. Marla Roe who seemed to want to apply Dakin's solution to this I'm not extreme short what value this would be. I would suggest Iodoflex which she still has at home. 07/28/16; I follow Mrs. Ruperto episodically along with Dr. Marla Roe. She has very refractory venous insufficiency wounds on her bilateral medial legs  left greater than right. She has been applying a topical collagen ointment to both wounds with Adaptic. I don't think Dr. Marla Roe is planning to take her back to the OR. 08/19/16;  I follow Mrs. Jeneen Rinks on a monthly basis along with Dr. Marla Roe of plastic surgery. 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 I been following her for a number of years. At one point I was able to get the right medial malleolus wound to heal and had the left medial malleolus down to about half its current size however and I had to send her to plastic surgery for an operative debridement. Since then things have been stable to slightly improve the area on the right is slightly better one in the left about the same although there is much less adherent surface than I'm used to with this patient. She is using some form of liquid collagen gel that Dr. Marla Roe provided a Kerlix cover with the patient's own pressure stockings. She tells me that she has extreme pain in both ankles and along the lateral aspect of both feet. She has been unable to work for some period of time. She is telling me she is retiring at the beginning of April. She sees Dr. Doran Durand of orthopedics next week 09/22/16; patient has not seen Dr. Marla Roe since the last time she is here. I'm not really sure what she is using to the wounds other than bits and pieces of think she had left over including most recently Hydrofera Blue. She is using juxtalite stockings. She is having difficulty with her husband's recent illness "stroke". She is having to transport him to various doctors appointments. Dr. Marla Roe left her the option of a repeat debridement with ACEL however she has not been able to get the time to follow-up on this. She continues to have a fair amount of drainage out of these wounds with certainly precludes leaving dressings on all week 10/13/16; patient has not seen Dr.  Marla Roe since she was last in our clinic. I'm not really sure what she is doing with the wounds, we did try to get her [facility] and I think she is actually using this most of the time. Because of drainage she states she has to change this every second day although this is an improvement from what she used to do. She went to see Dr. Doran Durand who did not think she had a muscular issue with regards to her feet, he referred her to a neurologist and I think the appointment is sometime in June. I changed her back to Iodoflex which she has used in the past but not recently. 11/03/16; the patient has been using Iodoflex although she ran out of this. Still claims that there is a lot of drainage although the wound does not look like this. No surrounding erythema. She has not been back to see Dr. Marla Roe 11/24/16; the patient has been using Iodoflex again but she ran out of it 2 or 3 days ago. There is no major change in the condition of either one of these wounds in fact they are larger and covered in a thick adherent surface slough/nonviable tissue especially on the left. She does not tolerate mechanical debridement in our clinic. Going back to see Dr. Marla Roe of plastic surgery for an operative debridement would seem reasonable. 12/15/16; the patient has not been back to see Dr. Marla Roe. She is been dealing with a series of illnesses and her husband which of monopolized her time. She is been using Sorbact which we largely supplied. She states the drainage is bad enough that it maximum she can go 2-3 days without changing the  dressing 01/12/2017 -- the patient has not been back for about 4 weeks and has not seen Dr. Marla Roe not does she have any appointment pending. 01/23/17; patient has not seen Dr. Marla Roe even though I suggested this previously. She is using Santyl that was suggested last week by Dr. Con Memos this Cost her $16 through her insurance which is indeed surprising 02/12/17;  continuing Santyl and the patient is changing this daily. A lot of drainage. She has not been back to see plastic surgery she is using an Ace wrap. Our intake nurse suggested wrap around stockings which would make a good reasonable alternative 02/26/17; patient is been using Santyl and changing this daily due to drainage. She has not been to see plastic surgery she uses in April Ace wrap to control the edema. She did obtain extremitease stockings but stated that the edema in her leg was to big for these 03/20/17; patient is using Santyl and Anasept. Surfaces looked better today the area on the right is actually measuring a little smaller. She has states she has a lot of pain in her feet and ankles and is asking for a consult to pain control which I'll try to help her with through our case manager. 04/10/17; the patient arrives with better-looking wound surfaces and is slightly smaller wound on the left she is using a combination of Santyl and Anasept. She has an appointment or at least as started in the pain control center associated with [facility] 05/14/17; this is a patient who I followed for a prolonged period of time. She has venous insufficiency ulcers on her bilateral medial ankles. At one point I had this down to a much smaller wound on the left however these reopened and we've never been able to get these to heal. She has been using Santyl and Anasept gel although 2 weeks ago she ran out of the Anasept gel. She has a stable appearance of the wound. She is going to the wound care clinic at [facility] They wanted do a nerve block/spinal block although she tells me she is reluctant to go forward with that. 05/21/17; this is a patient I have followed for many years. She has venous insufficiency ulcers on her bilateral medial ankles. Chronic pain and deformity in her ankles as well. She is been to see plastic surgery as well as orthopedics. Using PolyMem AG most recently/Kerramax/ABDs  and 2 layer compression. She has managed to keep this on and she is coming in for a nurse check to change the dressing on Tuesdays, we see her on Fridays 06/05/17; really quite a good looking surface and the area especially on the right medial has contracted in terms of dimensions. Well granulated healthy-looking tissue on both sides. Even with an open curet there is nothing that even feels abnormal here. This is as good as I've seen this in quite some time. We have been using PolyMem AG and bringing her in for a nurse check 06/12/17; really quite good surface on both of these wounds. The right medial has contracted a bit left is not. We've been using PolyMem and AG and she is coming in for a nurse visit 06/19/17; we have been using PolyMem AG and bringing her in for a nurse check. Dimensions of her wounds are not better but the surfaces looked better bilaterally. She complained of bleeding last night and the left wound and increasing pain bilaterally. She states her wound pain is more neuropathic than just the wounds. There was some  suggestion that this was radicular from her pain management doctor in talking to her it is really difficult to sort this out. 06/26/17; using PolyMem and AG and bringing her in for a nurse check as All of this and reasonably stable condition. Certainly not improved. The dimensions on the lateral part of the right leg look better but not really measuring better. The medial aspect on the left is about the same. 07/03/16; we have been using PolyMen AG and bringing her in for a nurse check to change the dressings as the wounds have drainage which precludes once weekly changing. We are using all secondary absorptive dressings.our intake nurse is brought up the idea of using a wound VAC/snap VAC on the wound to help with the drainage to see if this would result in some contraction. This is not a bad idea. The area on the right medial is actually looking smaller. Both wounds have a  reasonable-looking surface. There is no evidence of cellulitis. The edema is well controlled 07/10/17; the patient was denied for a snap VAC by her insurance. The major issue with these wounds continues to be drainage. We are using wicked PolyMem AG and she is coming in for a nurse visit to change this. The wounds are stable to slightly improved. The surface looks vibrant and the area on the right certainly has shrunk in size but very slowly 07/17/17; the patient still has large wounds on her bilateral medial malleoli. Surface of both of these wounds looks better. The dimensions seem to come and go but no consistent improvement. There is no epithelialization. We do not have options for advanced treatment products due to insurance issues. They did not approve of the wound VAC to help control the drainage. More recently we've been using PolyMem and AG wicked to allow drainage through. We have been bringing her in for a nurse visit to change this. We do not have a lot of options for wound care products and the home again due to insurance issues 07/24/17; the patient's wound actually looks somewhat better today. No drainage measurements are smaller still healthy-looking surface. We used silver collagen under PolyMen started last week. We have been bringing her in for a dressing change 07/31/17; patient's wound surface continued to look better and I think there is visible change in the dimensions of the wound on the right. Rims of epithelialization. We have been using silver collagen under PolyMen and bringing her in for a dressing change. There appears to be less drainage although she is still in need of the dressing change 08/07/17. Patient's wound surface continues to look better on both sides and the area on the right is definitely smaller. We have been using silver collagen and PolyMen. She feels that the drainage has been it has been better. I asked her about her vascular status. She went to see Dr.  Aleda Grana at [facility] and had some form of ablation. I don't have much detail on this. I haven't my notes from 2016 that she was not a candidate for any further ablation but I don't have any more information on this. We had referred her to vein and vascular I don't think she ever went. He does not have a history of PAD although I don't have any information on this either. We don't even have ABIs in our record 08/14/17; we've been using silver collagen and PolyMen cover. And putting the patient and compression. She we are bringing her in as a nurse visit to change  this because ofarge amount of drainage. We didn't the ABIs in clinic today since they had been done in many moons 1.2 bilaterally. She has been to see vein and vascular however this was at [facility] and she had ablation although I really don't have any information on this all seemed biking get a report. She is also been operatively debrided by plastic surgery and had a cell placed probably 8-12 months ago. This didn't have a major effect. We've been making some gains with current dressings 08/19/17-She is here in follow-up evaluation for bilateral medial malleoli ulcers. She continues to tolerate debridement very poorly. We will continue with recently changed topical treatment; if no significant improvement may consider switching to Iodosorb/Iodoflex. 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 She has been using silver collagen and PolyMem. I believe she has been used and tried on Iodoflex before. 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 These have been very refractory over time. We have been using silver collagen and PolyMen. She is coming in once a week for a doctor's and once a week for  nurse visits. We are actually making some progress 09/18/17; the patient's wounds are smaller especially on the right medial. She arrives today to upset to consider even washing these off with Anasept which I think is been part of the reason this is been closing. We've been using collagen covered in PolyMen otherwise. It is noted that she has a small area of folliculitis on the right medial calf that. As we are wrapping her legs I'll give her a short course of doxycycline to make sure this doesn't amount to anything. She is a long list of complaints today including imbalance, shortness of breath on exertion, inversion of her left ankle. With regards to the latter complaints she is been to see orthopedics and they offered her a tendon release surgery I believe but wanted her wounds to be closed first. I have recommended she go see her primary physician with regards to everything else. 09/25/17; patient's wounds are about the same size. We have made some progress bilaterally although not in recent weeks. She will not allow me T wash these wounds with Anasept even if she is doing her cell. Wheeze we've been using collagen covered in PolyMen. Last week she had a small area of folliculitis this is now opened into a small wound. She completed 5 days of trimethoprim sulfamethoxazole 10/02/17; unfortunately the area on her left medial ankle is worse with a larger wound area towards the Achilles. The patient complains of a lot of pain. She will not allow debridement although visually I don't think there is anything to debridement in any case. We have been using silver collagen and PolyMen for several months now. Initially we are making some progress although I'm not really seeing that today. We will move back to [facility]. His admittedly this is a bit of a repeat however I'm hoping that his situation is different now. The patient tells me she had her leg on the left give out on her yesterday this is process  some pain. 10/09/17; the patient is seen twice a week largely because of drainage issues coming out of the chronic medial bimalleolar wounds that are chronic. Last week the dimensions of the one on the  left looks a little larger I changed her to [facility]. She comes in today with a history of terrible pain in the bilateral wound areas. She will not allow debridement. She will not even allow a tissue culture. There is no surrounding erythema no no evidence of cellulitis. We have been putting her Kerlix Coban man. She will not allow more aggressive compression as there was a suggestion to put her in 3 layer wraps. 10/16/17; large wounds on her bilateral medial malleoli. These are chronic. Not much change from last week. The surface looks have healthy but absolutely no epithelialization. A lot of pain little less so of drainage. She will not allow debridement or even washing these off in the vigorous fashion with Anasept. 10/23/17; large wounds on her bilateral malleoli which are chronic. Some improvement in terms of size perhaps on the right since last time I saw these. She states that after we increased the 3 layer compression there was some bleeding, when she came in for a nurse visit she did not want 3 layer compression put back on about our nurse managed to convince her. She has known chronic venous visit issues and I'm hoping to get her to tolerate the 3 layer compression. using Hydrofera Blue 10/30/17; absolutely no change in the condition of either wound although we've had some improvement in dimensions on the right.. Attempted to put her in 3 layer compression she didn't tolerated she is back in 2 layer compression. We've been using Hydrofera Blue We looked over her past records. She had venous reflux studies in November 2016. There was no evidence of deep venous reflux on the right. Superficial vein did not show the greater saphenous vein at think this is been previously ablated the small  saphenous vein was within normal limits. The left deep venous system showed no DVT the vessels were positive for deep venous reflux in the posterior tibial veins at the ankle. The greater saphenous vein was surgically absent small saphenous vein was within normal limits. She went to vein and vascular at [facility]. I believe she had an ablation on the left greater saphenous vein. I'll update her reflux studies perhaps ever reviewed by vein and vascular. We've made absolutely no progress in these wounds. Will also try to read and TheraSkins through her insurance 11/06/17; W the patient apparently has a 2 week follow-up with vein and vascular I like him to review the whole issue with regards to her previous vascular workup by Dr. Aleda Grana. We've really made no progress on these wounds in many months. She arrives today with less viable looking surface on the left medial ankle wound. This was apparently looking about the same on Tuesday when she was here for nurse visit. 11/13/17; deep tissue culture I did last time of the left lower leg showed multiple organisms without any predominating. In particular no Staphylococcus or group A strep were isolated. We sent her for venous reflux studies. She's had a previous left greater saphenous vein stripping and I think sclerotherapy of the right greater saphenous vein. She didn't really look at the lesser saphenous vein this both wounds are on the medial aspect. She has reflux in the common femoral vein and popliteal vein and an accessory vein on the right and the common femoral vein and popliteal vein on the left. I'm going to have her go to see vein and vascular just the look over things and see if anything else beside aggressive compression is indicated here. We have not  been able to make any progress on these wounds in spite of the fact that the surface of the wounds is never look too bad. 11/20/17; no major change in the condition of the wounds. Patient  reports a large amount of drainage. She has a lot of complaints of pain although enlisting her today I wonder if some of this at least his neuropathic rather than secondary to her wounds. She has an appointment with vein and vascular on 12/30/17. The refractory nature of these wounds in my mind at least need vein and vascular to look over the wounds the recent reflux studies we did and her history to see if anything further can be done here. I also note her gait is deteriorated quite a bit. 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 She has a bilateral Trendelenburg gait. I wonder if this is neuropathic or perhaps multilevel radicular. 11/27/17; her wounds actually looks slightly better. Healthy-looking granulation tissue a scant amount of epithelialization. [place] healthcare will not pay for [employer]. They will play for tri layer Oasis and Dermagraft. This is not a diabetic ulcer. We'll try for the tri layer Oasis. She still complains of some drainage. She has a vein and vascular appointment on 12/30/17 12/04/17; the wounds visually look quite good. Healthy-looking granulation with some degree of epithelialization. We are still waiting for response to our request for trial to try layer Oasis. Her appointment with vascular to review venous and arterial issues isn't sold the end of July 7/31. Not allow debridement or even vigorous cleansing of the wound surface. 12/18/17; slightly smaller especially on the right. 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 We've been using Hydrofera Blue. We still are looking into triple layer Oasis through her insurance 01/08/18 on evaluation today patient's wound actually appears to be showing signs of good improvement at this point in time. She has been tolerating the dressing changes without complication. Fortunately there does not appear to be any evidence of infection at this point in time. We have been utilizing silver nitrate  which does seem to be of benefit for her which is also good news. Overall I'm very happy with how things seem to be both regards appearance as well as measurement. Patient did see Dr. Bridgett Larsson for evaluation on 12/30/17. In his assessment he felt that stripping would not likely add much more than chronic compression to the patient's healing process. His recommendation was to follow-up in three months with Dr. Doren Custard if she hasn't healed in order to consider referral back to you and see vascular where she previously was in a trial and was able to get her wound to heal. I'll be see what she feels she when you staying compression and he reiterated this as well. 01/13/18 on evaluation today patient appears to actually be doing very well in regard to her bilateral medial malleolus ulcers. She seems to have tolerated the chemical cauterization with silver nitrate last week she did have some pain through that evening but fortunately states that I'll be see since it seems to be doing better she is overall pleased with the progress. 01/21/18; really quite a remarkable improvement since I've last seen these wounds. We started using silver nitrate specially on the islands of hyper granulation which for some reason her around the wound circumference. This is really done quite nicely. Primary dressing Hydrofera Blue under 4 layer compression. She seems to be able to hold out without a nurse rewrap. Follow-up in 1 week  01/28/18; we've continued the hydrofera blue but continued with chemical cauterization to the wound area that we started about a month ago for irregular hyper granulation. She is made almost stunning improvement in the overall wound dimensions. I was not really expecting this degree of improvement in these chronic wounds 02/05/18; we continue with Hydrofera Bluebut of also continued the aggressive chemical cauterization with silver nitrate. We made nice progress with the right greater than left  wound. 02/12/18. We continued with Hydrofera Blue after aggressive chemical cauterization with silver nitrate. We appear to be making nice progress with both wound areas 02/19/2018; we continue with [facility] after washing the wounds vigorously with Anasept spray and chemical cauterization with silver nitrate. We are making excellent progress. The area on the right's just about closed 02/26/2018. The area on the left medial ankle had too much necrotic debris today. I used a #5 curette we are able to get most of the soft. I continued with the silver nitrate to the much smaller wound on the right medial ankle she had a new area on her right lower pretibial area which she says was due to a role in her compression 03/05/2018; both wound areas look healthy. Not much change in dimensions from last week. I continue to use silver nitrate and Hydrofera Blue. The patient saw Dr. Doren Custard of vein and vascular. He felt she had venous stasis ulcers. He felt based on her previous arteriogram she should have adequate circulation for healing. Also she has deep venous reflux but really no significant correctable superficial venous reflux at this time. He felt we should continue with conservative management including leg elevation and compression 04/02/2018; since we last saw this woman about a month ago she had a fall apparently suffered a pelvic fracture. I did not look up the x-ray. Nevertheless because of pain she literally was bedbound for 2 weeks and had home health coming out to change the dressing. Somewhat predictably this is resulted in considerable improvement in both wound areas. The right is just about closed on the medial malleolus and the left is about half the size. 04/16/2018; both her wounds continue to go down in size. Using Hydrofera Blue. 05/07/18; both her wounds appeared to be improving especially on the right where it is almost closed. We are using Hydrofera Blue 05/14/2018; slightly worse this  week with larger wounds. Surface on the left medial not quite as good. We have been using Hydrofera Blue 05/21/18; again the wounds are slightly larger. Left medial malleolus slightly larger with eschar around the circumference. We have been using Hydrofera Blue undergoing a wraps for a prolonged period of time. This got a lot better when she was more recumbent due to a fall and a back injury. I change the primary dressing the silver alginate today. She did not tolerate a 4 layer compression previously although I may need to bring this up with her next time 05/28/2018; area on the left medial malleolus again is slightly larger with more drainage. Area on the right is roughly unchanged. She has a small area of folliculitis on the right medial just on the lower calf. This does not look ominous. 06/03/2018 left medial malleolus slightly smaller in a better looking surface. We used silver nitrate on this last time with silver alginate. The area on the right appears slightly smaller 1/10; left medial malleolus slightly smaller. Small open area on the right. We used silver nitrate and silver alginate as of 2 weeks ago. We continue with  the wound and compression. These got a lot better when she was off her feet 1/17; right medial malleolus wound is smaller. The left may be slightly smaller. Both surfaces look somewhat better. 1/24; both wounds are slightly smaller. Using silver alginate under Unna boots 1/31; both wounds appear smaller in fact the area on the right medial is just about closed. Surface eschar. We have been using silver alginate under Unna boots. The patient is less active now spends let much less time on her feet and I think this is contributed to the general improvement in the wound condition 2/7; both wounds appear smaller. I was hopeful the right medial would be closed however there there is still the same small open area. Slight amount of surface eschar on the left the dimensions are  smaller there is eschar but the wound edges appear to be free. We have been using silver alginate under Unna boot's 2/14; both wounds once again measure smaller. Circumferential eschar on the left medial. We have been using silver alginate under Unna boots with gradual improvement 2/21; the area on the right medial malleolus has healed. The area on the left is smaller. We have been using silver alginate and Unna boots. We can discharge wrapping the right leg she has 20/30 stockings at home she will need to protect the scar tissue in this area 2/28; the area on the right medial malleolus remains closed the patient has a compression stocking. The area on the left is smaller. We have been using silver alginate and Unna boots. 3/6 the area on the right medial ankle remains closed. Good edema control noted she is using her own compression stocking. The area on the left medial ankle is smaller. We have been managing this with silver alginate and Unna boots which we will continue today. 3/13; the area on the right medial ankle remains closed and I'm declaring it healed today. When necessary the left is about the same still a healthy-looking surface but no major change and wound area. No evidence of infection and using silver alginate under unna and generally making considerable improvement 3/27 the area on the right medial ankle remains closed the area on the left is about the same as last week. Certainly not any worse we have been using silver alginate under an Unna boot 4/3; the area on the right medial ankle remains closed per the patient. We did not look at this wound. The wound on the left medial ankle is about the same surface looks healthy we have been using silver alginate under an Unna boot 4/10; area on the right medial ankle remains closed per the patient. We did not look at this wound. The wound on the left medial ankle is slightly larger. The patient complains that the [facility] caused  burning pain all week. She also told us that she was a lot more active this week. Changed her back to silver alginate 4/17; right medial ankle still closed per the patient. Left medial ankle is slightly larger. Using silver alginate. She did not tolerate Hydrofera Blue on this area 4/24; right medial ankle remains closed we have not look at this. The left medial ankle continues to get larger today by about a centimeter. We have been using silver alginate under Unna boots. She complains about 4 layer compression as an alternative. She has been up on her feet working on her garden 5/8; right medial ankle remains closed we did not look at this. The left medial ankle has  increased in size about 100%. We have been using silver alginate under Unna boots. She noted increased pain this week and was not surprised that the wound is deteriorated 5/15; no major change in SA however much less erythema ( one week of doxy ocellulitis). 5/22-65 year old female returns at 1 week to the clinic for left medial ankle wound for which we have been using silver alginate under 3 layer compression She was placed on DOXY at last visit - the wound is wider at this visit. She is in 3 layer compression 5/29; change to [facility] last week. I had given her empiric doxycycline 2 weeks ago for a week. She is in 3 layer compression. She complains of a lot of pain and drainage on presentation today. 6/5; using Hydrofera Blue. I gave her doxycycline recently empirically for erythema and pain around the wound. Believe her cultures showed enterococcus which not would not have been well covered by doxycycline nevertheless the wound looks better and I don't feel specifically that the enterococcus needs to be covered. She has a new what looks like a wrap injury on her lateral left ankle. 6/12; she is using Hydrofera Blue. She has a new area on the left anterior lower tibial area. This was a wrap injury last week. 6/19; the patient is  using Hydrofera Blue. She arrived with marked inflammation and erythema around the wound and tenderness. 12/01/18 on evaluation today patient appears to be doing a little bit better based on what I'm hearing from the standpoint of lassos evaluation to this as far as the overall appearance of the wound is concerned. Then sometime substandard she typically sees Dr. Dellia Nims. Nonetheless overall very pleased with the progress that she's made up to this point. No fevers, chills, nausea, or vomiting noted at this time. 7/10; some improvement in the surface area. Aggressively debrided last week apparently. I went ahead with the debridement today although the patient does not tolerate this very well. We have been using Iodoflex. Still a fair amount of drainage 7/17; slightly smaller. Using Iodoflex. 7/24; no change from last week in terms of surface area. We have been using Iodoflex. Surface looks and continues to look somewhat better 7/31; surface area slightly smaller better looking surface. We have been using Iodoflex. This is under Unna boot compression 8/7-Patient presents at 1 week with Unna boot and Iodoflex, wound appears better 8/14-Patient presents at 1 week with Iodoflex, we use the Unna boot, wound appears to be stable better.Patient is getting Botox treatment for the inversion of the foot for tendon release, Next week 8/21; we are using Iodoflex. Unna boot. The wound is stable in terms of surface area. Under illumination there is some areas of the wound that appear to be either epithelialized or perhaps this is adherent slough at this point I was not really clear. It did not wipe off and I was reluctant to debride this today. 8/28; we are using Iodoflex in an Unna boot. Seems to be making good improvement. 9/4; using Iodoflex and wound is slightly smaller. 9/18; we are using Iodoflex with topical silver nitrate when she is here. The wound continues to be smaller 10/2; patient missed her  appointment last week due to GI issues. She left and Iodoflex based dressing on for 2 weeks. Wound is about the same size about the size of a dime on the left medial lower 10/9 we have been using Iodoflex on the medial left ankle wound. She has a new superficial probable wrap injury  on the dorsal left ankle 10/16; we have been using Hydrofera Blue since last week. This is on the left medial ankle 10/23; we have been using Hydrofera Blue since 2 weeks ago. This is on the left medial ankle. Dimensions are better 11/6; using Hydrofera Blue. I think the wound is smaller but still not closed. Left medial ankle 11/13; we have been using Hydrofera Blue. Wound is certainly no smaller this week. Also the surface not as good. This is the remanent of a very large area on her left medial ankle. 11/20; using Sorbact since last week. Wound was about the same in terms of size although I was disappointed about the surface debris 12/11; 3-week follow-up. Patient was on vacation. Wound is measuring slightly larger we have been using Sorbact. 12/18; wound is about the same size however surface looks better last week after debridement. We have been using Sorbact under compression 1/15 wound is probably twice the size of last time increased in length nonviable surface. We have been using Sorbact. She was running a mild fever and missed her appointment last week 1/22; the wound is come down in size but under illumination still a very adherent debris we have been Hydrofera Blue that I changed her to last week 1/29; dimensions down slightly. We have been using Hydrofera Blue 2/19 dimensions are the same however there is rims of epithelialization under illumination. Therefore more the surface area may be epithelialized 2/26; the patient's wound actually measures smaller. The wound looks healthy. We have been using Hydrofera Blue. I had some thoughts about running Apligraf then I still may do that however this looks so much  better this week we will delay that for now 3/5; the wound is small but about the same as last week. We have been using Hydrofera Blue. No debridement is required today. 3/19; the wound is about the size of a dime. Healthy looking wound even under illumination. We have been using Hydrofera Blue. No mechanical debridement is necessary 3/26; not much change from last week although still looks very healthy. We have been using Hydrofera Blue under Unna boots Patient was offered an ankle fusion by podiatry but not until the wound heals with a proceed with this. 4/9; the patient comes in today with her original wound on the medial ankle looking satisfactory however she has some uncontrolled swelling in the middle part of her leg with 2 new open areas superiorly just lateral to the tibia. I think this was probably a wrap issue. She said she felt uncomfortable during the week but did not call in. We have been using Hydrofera Blue 4/16; the wound on the medial ankle is about the same. She has innumerable small areas superior to this across her mid tibia. I think this is probably folliculitis. She is also been working in the yard doing a lot of sweating 4/30; the patient issue on the upper areas across her mid tibia of all healed. I think this was excessive yard work if I remember. Her wound on the medial ankle is smaller. Some debris on this we have been using Hydrofera Blue under Unna boots 5/7; mid tibia. She has been using Hydrofera Blue under an Unna wrap. She is apparently going for her ankle surgery on June 3 10/28/19-Patient returns to clinic with the ankle wound, we are using Hydrofera Blue under Unna wrap, surgery is scheduled for her left foot for June 3 so she will be back for nurse visit next week READMISSION 01/17/2020 Mrs.  Lineback is a 65 year old woman we have had in this clinic for a long period of time with severe venous hypertension and refractory wounds on her medial lower legs and ankles  bilaterally. 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 When she was off her legs for a prolonged period example when she fell and suffered a compression fracture things would heal up quite nicely. She is now retired and we managed to heal up the right medial leg wound. The left one was very tiny last time I saw this although still refractory. 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 It got to the point where this was interfering with her walking and she elected to proceed with a ankle arthrodesis to straighten her her ankle and leave her with a functional outcome for mobilization. The patient was referred to Dr. Doren Custard and really this took some time to arrange. Dr. Doren Custard saw her on 12/07/2019. Once again he verified that she had no arterial issues. She had previously had an angiogram several years ago. Follow-up ABIs on the left showed an ABI of 1.12 with triphasic waveforms and a TBI of 0.92. She is felt to have chronic deep venous insufficiency but I do not think it was felt that anything could be done from about this from an ablation point of view. At the time Dr. Doren Custard saw this patient the wounds actually look closed via the pictures in his clinic. The patient finally underwent her surgery on 12/15/2019. This went reasonably well and there was a good anatomic outcome. She developed a small distal wound dehiscence on the lateral part of the surgical wound. However more problematically she is developed recurrence of the wound on the medial left ankle. There are actually 2 wounds here one in the distal lower leg and 1 pretty much at the level of the medial malleolus. It is a more distal area that is more problematic. She has been using Hydrofera Blue which started on Friday before this she was  simply Ace wrapping. There was a culture done that showed Pseudomonas and she is on ciprofloxacin. A recent CNS on 8/11 was negative. The patient reports some pain but I generally think this is improving. She is using a cam boot completely nonweightbearing using a walker for pivot transfers and a wheelchair 8/24; not much improvement unfortunately she has a surgical wound on the lateral part in the venous insufficiency wound medially. The bottom part of the medial insufficiency wound is still necrotic there is exposed tendon here. We have been using Hydrofera Blue under compression. Her edema control is however better 8/31; patient in for follow-up of his surgical wound on the lateral part of her left leg and chronic venous insufficiency ulcers medially. We put her back in compression last week. She comes in today with a complaint of 3 or 4 days worth of increasing pain. She felt her cam walker was rubbing on the area on the back of her heel. However there is intense erythema seems more likely she has cellulitis. She had 2 cultures done when she was seeing podiatry in the postop. One of them in late July showed Pseudomonas and she received a course of ciprofloxacin the other was negative on 8/11 she is allergic to penicillin with anaphylactoid complaints of hives oral  swelling via information in epic 9/9; when I saw this patient last week she had intense anterior erythema around her wound on the right lateral heel and ankle and also into the right medial heel. Some of this was no doubt drainage and her walker boot however I was convinced she had cellulitis. I gave her Levaquin and Bactrim she is finishing up on this now. She is following up with Dr. [name] he saw her yesterday. He is taken her out of the walking boot of course she is still nonweightbearing. Her x-ray was negative for any worrisome features such as soft tissue air etc. Things are a lot better this week. She has home health. We have been  using Hydrofera Blue under an [employer] which she put back on yesterday. I did not wrap her last week 9/17; her surrounding skin looks a lot better. In fact the area on the left lateral ankle has just a scant amount of eschar. The only remaining wound is the large area on the left medial ankle. Probably about 60% of this is healthy granulation at the surface however she has a significant divot distally. This has adherent debris in it. I been using debridement and silver collagen to try and get this area to fill-in although I do not think we have made much progress this week 9/24; the patient's wound on the left medial ankle looks a lot better. The deeper divot area distally still requires debridement but this is cleaning up quite nicely we have been using silver collagen. The patient is complaining of swelling in her foot and is worried that that is contributing to the nonhealing of the ankle wound. She is also complaining of numbness in her anterior toes 10/4; left medial ankle. The small area distally still has a divot with necrotic material that I have been debriding away. This has an undermining area. She is approved for Apligraf. She saw Dr. [name] her surgeon on 10/1. I think he declared himself is satisfied with the condition of things. Still nonweightbearing till the end of the month. 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 Her surgical wound is well closed. There is no evidence of infection 10/11; the patient arrived in clinic today with the expectation that we be able to put an Apligraf on this area after debridement however she arrives with a relatively large amount of green drainage on the dressing. The patient states that this started on Friday. She has not been systemically unwell. 10/19; culture I did last week showed both Enterococcus and Pseudomonas. I think this came in separate parts because I stopped her ciprofloxacin I gave her and prescribed her  linezolid however now looking at the final culture result this is Pseudomonas which is resistant to quinolones. She has not yet picked up the linezolid apparently phone issues. We are also trying to get a topical antibiotic out of [facility] in [place] they can be applied by home health. She is still having green drainage 10/16; the patient has her topical antibiotic from [facility] in [place]. This is a compounded gel with vancomycin and ciprofloxacin and gentamicin. We are applying this on the wound bed with silver alginate over the top with Unna boot wraps. She arrives in clinic today with a lot less ominous looking drainage although she is only use this topical preparation once the second time today. She sees Dr. [name] her surgeon on Friday she has home health changing the dressing 11/2; still using  her compounded topical antibiotic under silver alginate. Surface is cleaning up there is less drainage. We had an Apligraf for her today and I elected to apply it. A light coating of her antibiotic 04/25/2020 upon evaluation today patient appears to be doing well with regard to her ankle ulcer. There is a little bit of slough noted on the surface of the wound I am can have to perform sharp debridement to clear this away today. With that being said other than that fact overall I feel like she is making progress and we do see some new epithelial growth. There is also some improvement in the depth of the wound and that distal portion. There is little bit of slough there as well. 12/7; 2-week follow-up. Apligraf #3. Dimensions are smaller. Closing in especially inferiorly. Still some surface debris. Still using the [facility] topical antibiotic but I told her that I don't think this needs to be renewed 12/21; 2-week follow-up. Apligraf #4 dimensions are smaller. Nice improvement 06/05/2020; 2-week follow-up. The patient's wound on the left medial ankle looks really excellent. Nice granulation.  Advancing epithelialization no undermining no evidence of infection. We would have to reapply for another Apligraf but with the condition of this wound I did not feel strongly about it. We used Hydrofera Blue under the same degree of compression. She follows up with Dr. [name] her surgeon a week Friday 06/13/2020 upon evaluation today patient appears to be doing excellent in regard to her wound. She has been tolerating the dressing changes without complication. Fortunately there is no signs of active infection at this time. No fevers, chills, nausea, vomiting, or diarrhea. She was using Hydrofera Blue last week. 06/20/2020 06/20/2020 on evaluation today patient actually appears to be doing excellent in regard to her wound. This is measuring better and looking much better as well. She has been using the collagen that seems to be doing better for her as well even though the [facility] was and is not sticking or feeling as rough on her wound. She did see Dr. [name] on Friday he is very pleased he also stated none of the hardware has shifted. That is great news 1/27; the patient has a small clean wound all that is remaining. I agree that this is too small to really consider an Apligraf. Under illumination the surface is looking quite good. We have been using collagen although the dimensions are not any better this week 2/2; the patient has a small clean wound on the left medial ankle. Although this left of her substantial original areas. Measurements are smaller. We have been using polymen Ag under an Haematologist. 2/10; small area on the left medial ankle. This looks clean nothing to debride however dimensions are about the same we have been using polymen I think now for 2 weeks 2/17; not much change in surface area. We have been using polymen Ag without any improvement. 3/17; 1 month follow-up. The patient has been using endoform without any improvement in fact I think this looks worse with more depth and  more expansion 3/24; no improvement. Perhaps less debris on the surface. We have been using Sorbact for 1 week 4/4; wound measures larger. She has edema in her leg and her foot which she tells as her wrap came down. We have been using Unna boots. Sorbact of the wound. She has been approved for Apligraf 09/12/2020 upon evaluation today patient appears to be doing well with regard to her wound. We did get the Apligraf reapproved  which is great news we have that available for application today. Fortunately there is no signs of infection and overall the patient seems to be doing great. The wound bed is nice and clean. 4/27; patient presents for her second application of Apligraf. She states over the past week she has been on her feet more often due to being outside in her garden. She has noted more swelling to her foot as a result. She denies increased warmth, pain or erythema to the wound site. 10/10/2020 upon evaluation today patient appears to be doing well with regard to her wound which does not appear to be quite as irritated as last week from what I am hearing. With that being said unfortunately she is having issues with some erythema and warmth to touch as well as an increase size. I do believe this likely is infected. 10/17/2020 upon evaluation today patient appears to be doing excellent in regard to her wound this is significantly improved as compared to last week. Fortunately I think that the infection is much better controlled at this point. She did have evidence of both Enterococcus as well as Staphylococcus noted on culture. Enterococcus really would not be helped significantly by the Cipro but the wound is doing so much better I am under the assumption that the Staphylococcus is probably the main organism that is causing the current infection. Nonetheless I think that she is doing excellent as far as that is concerned and I am very pleased in that regard. I would therefore recommend she  continue with the Cipro. 10/31/2020 upon evaluation today patient appears to be doing well with regard to her wound. She has been tolerating the dressing changes without complication. Fortunately there is no signs of active infection and overall I am extremely pleased with where things stand today. No fevers, chills, nausea, vomiting, or diarrhea. With that being said she does have some green drainage coming from the wound and although it looks okay I am a little concerned about the possibility of a continuing infection. Specifically with Pseudomonas. For that reason I will go ahead and send in a prescription for Cipro for her to be continued. 11/14/2020 upon evaluation today patient appears to be doing very well currently in regard to her wound on her leg. She has been tolerating the dressing changes without complication. Fortunately I feel like the infection is finally under good control here. Unfortunately we do not have the Apligraf for application today although we can definitely order to have it in place for next week. That will be her fifth and final of the current series. Nonetheless I feel like her wound is really doing quite well which is great news. 11/21/2020 upon evaluation today patient appears to be doing well with regard to her wound on the medial ankle. Fortunately I think the infection is under control and I do believe we can go ahead and reapply the Apligraf today. She is in agreement with that plan. There does not appear to be any signs of active infection at this time which is great news. No fevers, chills, nausea, vomiting, or diarrhea. 12/05/2020 upon evaluation today patient's wound bed actually showed signs of good granulation epithelization at this point. There does not appear to be any signs of infection which is great news and overall very pleased with where things stand. Overall the patient seems to be doing fairly well in my opinion with regard to her wound although I do believe  she continues to build up a lot  of biofilm I think she could benefit from using PuraPly at this point. 12/12/2020 upon evaluation today patient's wound actually appears to be doing decently well today. The Unna boot has not been quite as well-tolerated so that more uncomfortable for her and even causing some pressure over the plate on the lateral portion of her foot which is 90 where the wound is. There did not appear to be any significant deep tissue injury with that there may be a minimal change in the skin noted I think that we may want to go back to the Coflex 2 layer which is a little bit easier on her skin it seems. 12/19/2020 upon evaluation today patient actually seems to be making great progress with the PuraPly currently. She in fact seems to be much better as far as the overall appearance of the wound bed is concerned I am very happy in this regard. I do not see any signs of of infection which is great news as well. No fevers, chills, nausea, vomiting, or diarrhea. 12/26/2020 upon evaluation today patient appears to actually be doing better in regard to her wound on the left medial ankle region. The surface of the wound is actually doing significantly better which is great news. There does not appear to be any signs of infection which is also great news and in general I am extremely pleased with where we stand today. 01/02/2021 upon evaluation today patient appears to be doing well with regard to her wound. In fact this is showing signs of excellent improvement and very pleased with where things stand. In fact the last 3 appointments have all shown signs of this getting smaller which is excellent news. I have not even had to perform any debridement and today is no exception. Overall I feel like this is dramatically improved compared to previous. T oday is PuraPly application #4. 11/26/348 upon evaluation today patient appears to be doing excellent in regard to her wound this is continue to show  signs of improvement and overall I am extremely pleased with where we stand today. She is actually here for PuraPly application #5. Every time we have applied this she is noted definite improvement on measurements. 01/23/2021 upon evaluation today patient is actually making good progress in regard to her wound. This was actually on just a little bit longer this time compared to previous due to the fact that she did have to go out of town. She is actually here for PuraPly application #6. We have definitely been seeing improvements in the overall quality of the tissue on the surface of the wound which is awesome news. In general I think that the patient seems to be continuing to make great progress here. 01/30/2021 upon evaluation today patient's wound is actually doing excellent. There is really not any significant biofilm buildup which is great news and overall I am extremely pleased with where things stand today. There does not appear to be any signs of active infection. No fevers, chills, nausea, vomiting, or diarrhea. 02/06/2021 upon evaluation today patient's wound is actually showing signs of excellent improvement which is great news. We continue to see the benefit of the PuraPly this is doing a great job the wound seems not really irritated whatsoever and is showing signs of good granulation at this point. Overall I am extremely happy with what we are seeing. The patient likewise is happy to hear all of this as well. 02/13/2021 upon evaluation today patient appears to be doing well with regard to  her wound. This again is measuring smaller today and I am very pleased in this regard. Fortunately there does not appear to be any signs of active infection at this time which is good news from a systemic standpoint. Locally there is still some significant drainage which she does have is concerned about infection locally. No fevers, chills, nausea, vomiting, or diarrhea. 02/20/2021 upon evaluation today  patient actually appears to be doing decently well in regard to her wound. She has been tolerating the dressing changes without complication. I do believe the PuraPly is helping wound bed does appear to be doing better. There is no evidence of active infection locally or systemically at this point visually although on fluorescence imaging there still appear to be bacterial load and bioburden noted. 02/27/2021 upon evaluation today patient fortunately continues to show signs of improvement with use of the PuraPly currently. Subsequently we did review her culture results and to be honest I think that the Bactrim is probably the best option to have her continue at this point. For that reason I am get a go ahead and send in a refill today for her for an additional 2 weeks. Nonetheless I think that we are making excellent progress here. It was Enterococcus and Staphylococcus that were noted she is allergic to penicillin so there is not much I can do from the Enterococcus standpoint though the staff does seem to be sensitive to the Bactrim. 03/06/2021 upon evaluation today patient appears to be doing well with regard to her wound. This is definitely showing signs of improvement which is great news and overall I am extremely pleased with where things stand today. There does not appear to be any signs of active infection which is great news and overall and I do believe that we are headed in the appropriate direction I think the PuraPly is doing an awesome job for her. 03/20/2021 upon evaluation today patient actually appears to be doing well with regard to her wound. We have been using the PuraPly although last week when I was out of the office they actually just used endoform nonetheless this still seems to be doing great. Fortunately there does not appear to be any signs of active infection which is great news overall I think the patient is making wonderful progress. 03/27/2021 upon evaluation today patient  appears to be doing well with regard to her wound in fact there is a little drainage but otherwise her does not appear to be any major signs of open wounds nor infection at this time. Overall I am extremely pleased with where things stand. No fevers, chills, nausea, vomiting, or diarrhea. 04/03/2021 upon evaluation today patient presents for reevaluation here in the clinic and overall she seems to be doing quite well. Fortunately there does not appear to be any signs of active infection which is great news and in general I am very pleased with how things appear. There is still a very small opening remaining but in general she is looking much better. Fortunately I do not think there is any need for the PuraPly at this point. 04/10/2021 upon evaluation today patient's wound is actually showing signs of excellent improvement. I am actually very pleased with where it stands today. I think she is very close to complete closure in fact this may be healed today. Either way I think that she may benefit from using her own compression socks for the next week and then will subsequently see how things stand following. 04/17/2021 upon evaluation today  patient appears to be doing well with regard to her wound on the ankle region. This is actually showing signs of excellent improvement which is great news. I do not see any signs of active infection at this time which is great and no open wound. With that being said on the lateral portion of her ankle right at the base of her plate she actually has an area of pressure that occurred over the past week when she was not in the wrap or we been padding her. This does seem to be an issue which I think is good to be an ongoing problem I am can actually get in touch with Dr. [name] today to see what we can do in that regard. 04/24/2021 upon evaluation today patient appears to be doing well with regard to her ankle region for the most part although there does appear to be some  fluid trapped underneath the area where we thought this was healed last week. Unfortunately I think at this time that we can have to reopen and clean away some of this area and then subsequently depending on how things do over the next week or so we will see about discharge but right now I do not think that is the right way to go. She voiced understanding. 05/01/2021 upon evaluation today patient's wound still appears to be open unfortunately. I do believe that we do need to see what we can do about trying to get this closed I believe compression wrapping is probably can to be indicated here. Fortunately there is no signs of active infection at this time. 05/08/2021 upon evaluation today patient appears to be doing okay in regard to her ulcer this is very superficial and we will see any major issues here. The biggest problem currently that I find is that we just cannot get this area to completely seal up. She seemed to almost be and then things digressed a little bit. Right now is not too bad but we do need to see if we can make some difference here. I think maybe switching over to collagen may be better. Electronic Signature(s) Signed: 05/08/2021 4:49:42 PM By: [name] PA-C Entered By: [name] on 05/08/2021 16:49:42 -------------------------------------------------------------------------------- Physical Exam Details Patient Name: Date of Service: [facility] [name], [name] 05/08/2021 10:30 A M Medical Record Number: [id number] Patient Account Number: [id number] Date of Birth/Sex: Treating RN: 1956-04-04 (65 y.o. [name] Provider: [name] Other Clinician: Referring Provider: Treating Provider/Extender: [name] in Treatment: 29 Constitutional Well-nourished and well-hydrated in no acute distress. Respiratory normal breathing without difficulty. Psychiatric this patient is able to make decisions and demonstrates good insight  into disease process. Alert and Oriented x 3. pleasant and cooperative. Notes Upon inspection patient's wound bed showed signs of very superficial ulceration there does not appear to be any evidence of infection which is great news and overall very pleased in that regard. No fevers, chills, nausea, vomiting, or diarrhea. Electronic Signature(s) Signed: 05/08/2021 4:50:02 PM By: [name] PA-C Entered By: [name] on 05/08/2021 16:50:02 -------------------------------------------------------------------------------- Physician Orders Details Patient Name: Date of Service: [facility] [name], [name] 05/08/2021 10:30 A M Medical Record Number: [id number] Patient Account Number: [id number] Date of Birth/Sex: Treating RN: 1956-04-03 (65 y.o. [name] Provider: [name] Other Clinician: Referring Provider: Treating Provider/Extender: [name] in Treatment: 56 Verbal / Phone Orders: No Diagnosis Coding  ICD-10 Coding Code Description I87.332 Chronic venous hypertension (idiopathic) with ulcer and inflammation of left lower extremity L97.828 Non-pressure chronic ulcer of other part of left lower leg with other specified severity L97.328 Non-pressure chronic ulcer of left ankle with other specified severity Follow-up Appointments ppointment in 1 week. - with Margarita Grizzle Return A Edema Control - Lymphedema / SCD / Other Bilateral Lower Extremities Patient to wear own compression stockings every day. Exercise regularly Moisturize legs daily. Non Wound Condition pply the following to affected area as directed: - Apply silicone bordered foam to left lateral foot for protection until your f/u with Dr. [name] A Wound Treatment Wound #15R - Malleolus Wound Laterality: Left, Medial Cleanser: Wound Cleanser (Generic) 1 x Per Day/15 Days Discharge Instructions: Cleanse the wound with wound cleanser prior to applying a clean dressing using gauze  sponges, not tissue or cotton balls. Prim Dressing: Promogran Prisma Matrix, 4.34 (sq in) (silver collagen) 1 x Per Day/15 Days ary Discharge Instructions: Moisten collagen with saline or hydrogel Secondary Dressing: Woven Gauze Sponge, Non-Sterile 4x4 in (Generic) 1 x Per Day/15 Days Discharge Instructions: Apply over primary dressing as directed. Compression Wrap: CoFlex TLC XL 2-layer Compression System 4x7 (in/yd) 1 x Per Day/15 Days Discharge Instructions: Apply CoFlex 2-layer compression as directed. (alt for 4 layer) Electronic Signature(s) Signed: 05/08/2021 4:49:37 PM By: Baruch Gouty RN, BSN Signed: 05/08/2021 4:51:36 PM By: [name] PA-C Entered By: Baruch Gouty on 05/08/2021 11:02:47 -------------------------------------------------------------------------------- Problem List Details Patient Name: Date of Service: [name], [name] 05/08/2021 10:30 A M Medical Record Number: [id number] Patient Account Number: [id number] Date of Birth/Sex: Treating RN: Oct 30, 1955 (65 y.o. [name] Provider: [name] Other Clinician: Referring Provider: Treating Provider/Extender: [name] in Treatment: 53 Active Problems ICD-10 Encounter Code Description Active Date MDM Diagnosis I87.332 Chronic venous hypertension (idiopathic) with ulcer and inflammation of left 01/17/2020 No Yes lower extremity L97.828 Non-pressure chronic ulcer of other part of left lower leg with other specified 01/17/2020 No Yes severity L97.328 Non-pressure chronic ulcer of left ankle with other specified severity 01/17/2020 No Yes Inactive Problems ICD-10 Code Description Active Date Inactive Date L03.116 Cellulitis of left lower limb 01/31/2020 01/31/2020 T81.31XD Disruption of external operation (surgical) wound, not elsewhere classified, subsequent 01/17/2020 01/17/2020 encounter Resolved Problems Electronic Signature(s) Signed: 05/08/2021  10:55:37 AM By: [name] PA-C Entered By: [name] on 05/08/2021 10:55:37 -------------------------------------------------------------------------------- Progress Note Details Patient Name: Date of Service: [facility] [name], [name] 05/08/2021 10:30 A M Medical Record Number: [id number] Patient Account Number: [id number] Date of Birth/Sex: Treating RN: Jan 03, 1956 (65 y.o. [name] Provider: [name] Other Clinician: Referring Provider: Treating Provider/Extender: [name] in Treatment: 45 Subjective Chief Complaint Information obtained from Patient patient is been followed long-term in this clinic for venous insufficiency ulcers with inflammation, hypertension and ulceration over the medial ankle bilaterally. 01/17/2020; this is a patient who is here for review of postoperative wounds on the left lateral ankle and recurrence of venous stasis ulceration on the left medial History of Present Illness (HPI) the remaining wound is over the left medial ankle. Similar wound over the right medial ankle healed largely with use of Apligraf. Most recently we have been using Hydrofera Blue over this wound with considerable improvement. The patient has been extensively worked up in the past for her venous insufficiency and she is not a candidate for antireflux surgery although I have  none of the details available currently. 08/24/14; considerable improvement today. About 50% of this wound areas now epithelialized. The base of the wound appears to be healthier granulation.as opposed to last week when she had deteriorated a considerable improvement 08/17/14; unfortunately the wound has regressed somewhat. The areas of epithelialization from the superior aspect are not nearly as healthy as they were last week. The patient thinks her Hydrofera Blue slipped. 09/07/14; unfortunately the area has markedly regressed in the 2 weeks since I've seen this.  There is an odor surrounding erythema. The healthy granulation tissue that we had at the base of the wound now is a dusky color. The nurse reports green drainage 09/14/14; the area looks somewhat better than last week. There is less erythema and less drainage. The culture I did did not show any growth. Nevertheless I think it is better to continue the Cipro and doxycycline for a further week. The remaining wound area was debridement. 09/21/14. Wound did not require debridement last week. Still less erythema and less drainage. She can complete her antibiotics. The areas of epithelialization in the superior aspect of the wound do not look as healthy as they did some weeks ago 10/05/14 continued improvement in the condition of this wound. There is advancing epithelialization. Less aggressive debridement required 10/19/14 continued improvement in the condition and volume of this wound. Less aggressive debridement to the inferior part of this to remove surface slough and fibrinous eschar 11/02/14 no debridement is required. The surface granulation appears healthy although some of her islands of epithelialization seem to have regressed. No evidence of infection 11/16/14; lites surface debridement done of surface eschar. The wound does not look to be unhealthy. No evidence of infection. Unfortunately the patient has had podiatry issues in the right foot and for some reason has redeveloped small surface ulcerations in the medial right ankle. Her original presentation involved wounds in this area 11/23/14 no debridement. The area on the right ankle has enlarged. The left ankle wound appears stable in terms of the surface although there is periwound inflammation. There has been regression in the amount of new skin 11/30/14 no debridement. Both wound areas appear healthy. There was no evidence of infection. The the new area on the right medial ankle has enlarged although that both the surfaces appear to be  stable. 12/07/14; Debridement of the right medial ankle wound. No no debridement was done on the left. 12/14/14 no major change in and now bilateral medial ankle wounds. Both of these are very painful but the no overt evidence of infection. She has had previous venous ablation 12/21/14; patient states that her right medial ankle wound is considerably more painful last week than usual. Her left is also somewhat painful. She could not tolerate debridement. The right medial ankle wound has fibrinous surface eschar 12/28/14 this is a patient with severe bilateral venous insufficiency ulcers. For a considerable period of time we actually had the one on the right medial ankle healed however this recently opened up again in June. The left medial ankle wound has been a refractory area with some absent flows. We had some success with Hydrofera Blue on this area and it literally closed by 50% however it is recently opened up Foley. Both of these were debridement today of surface eschar. She tolerates this poorly 01/25/15: No change in the status of this. Thick adherent escar. Very poor tolerance of any attempt at debridement. I had healed the right medial malleolus wound for a considerable amount of time  and had the left one down to about 50% of the volume although this is totally regressed over the last 48 weeks. Further the right leg has reopened. she is trying to make a appointment with pain and vascular, previous ablations with Dr. Aleda Grana. I do not believe there is an arterial insufficiency issue here 02/01/15 the status of the adherent eschar bilaterally is actually improved. No debridement was done. She did not manage to get vascular studies done 02/08/15 continued debridement of the area was done today. The slough is less adherent and comes off with less pressure. There is no surrounding infection peripheral pulses are intact 02/15/15 selective debridement with a disposable curette. Again the slough is less  adherent and comes off with less difficulty. No surrounding infection peripheral pulses are intact. 02/22/15 selective debridement of the right medial ankle wound. Slough comes off with less difficulty. No obvious surrounding infection peripheral pulses are intact I did not debridement the one on the left. Both of these are stable to improved 03/01/15 selective debridement of both wound areas using a curette to. Adherent slough cup soft with less difficulty. No obvious surrounding infection. The patient tells me that 2 days ago she noted a rash above the right leg wrap. She did not have this on her lower legs when she change this over she arrives with widespread left greater than right almost folliculitis-looking rash which is extremely pruritic. I don't see anything to culture here. There is no rash on the rest of her body. She feels well systemically. 03/08/15; selective debridement of both wounds using a curette. Base of this does not look unhealthy. She had limegreen drainage coming out of the left leg wound and describes a lot of drainage. The rash on her left leg looks improved to. No cultures were done. 03/22/15; patient was not here last week. Basal wounds does not look healthy and there is no surrounding erythema. No drainage. There is still a rash on the left leg that almost looks vasculitic however it is clearly limited to the top of where the wrap would be. 04/05/15; on the right required a surgical debridement of surface eschar and necrotic subcutaneous tissue. I did not debridement the area on the left. These continue to be large open wounds that are not changing that much. We were successful at one point in healing the area on the right, and at the same time the area on the left was roughly half the size of current measurements. I think a lot of the deterioration has to do with the prolonged time the patient is on her feet at work 04/19/15 I attempted-like surface debridement bilaterally  she does not tolerate this. She tells me that she was in allergic care yesterday with extreme pain over her left lateral malleolus/ankle and was told that she has an "sprain" 05/03/15; large bilateral venous insufficiency wounds over the medial malleolus/medial aspect of her ankles. She complains of copious amounts of drainage and his usual large amounts of pain. There is some increasing erythema around the wound on the right extending into the medial aspect of her foot to. historically she came in with these wounds the right one healed and the left one came down to roughly half its current size however the right one is reopened and the left is expanded. 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 05/10/15 large bilateral venous insufficiency wounds. There is less adherence surface left  however the surface culture that I did last week grew pseudomonas therefore bilateral selective debridement score necessary. There is surrounding erythema. The patient describes severe bilateral drainage and a lot of pain in the left ankle. Apparently her podiatrist was were ready to do a cortisone shot 05/17/15; the patient complains of pain and again copious amounts of drainage. 05/24/15; we used Iodo flex last week. Patient notes considerable improvement in wound drainage. Only needed to change this once. 05/31/15; we continued Iodoflex; the base of these large wounds bilaterally is not too bad but there is probably likely a significant bioburden here. I would like to debridement just doesn't tolerate it. 06/06/14 I would like to continue the Iodoflex although she still hasn't managed to obtain supplies. She has bilateral medial malleoli or large wounds which are mostly superficial. Both of them are covered circumferentially with some nonviable fibrinous slough although she tolerates debridement very poorly. She apparently has an appointment for an ablation on the right  leg by interventional radiology. 06/14/15; the patient arrives with the wounds and static condition. We attempted a debridement although she does not do well with this secondary to pain. I 07/05/15; wounds are not much smaller however there appears to be a cleaner granulating base. The left has tight fibrinous slough greater than the right. Debridement is tolerated poorly due to pain. Iodoflex is done more for these wounds in any of the multitude of different dressings I have tried on the left 1 and then subsequently the right. 07/12/15; no change in the condition of this wound. I am able to do an aggressive debridement on the right but not the left. She simply cannot tolerate it. We have been using Iodoflex which helps somewhat. It is worthwhile remembering that at one point we healed the right medial ankle wound and the left was about 25% of the current circumference. We have suggested returning to vascular surgery for review of possible further ablations for one reason or another she has not been able to do this. 07/26/15 no major change in the condition of either wound on her medial ankle. I did not attempt to debridement of these. She has been aggressively scrubbing these while she is in the shower at home. She has her supply of Iodoflex which seems to have done more for these wounds then anything I have put on recently. 08/09/15 wound area appears larger although not verified by measurements. Using Iodoflex 09/05/2015 -- she was here for avisit today but had significant problems with the wound and I was asked to see her for a physician opinion. I have summarize that this lady has had surgery on her left lower extremity about 10 years ago where the possible veins stripping was done. She has had an opinion from interventional radiology around November 2016 where no further sclerotherapy was ordered. The patient works 12 hours a day and stands on a concrete floor with work boots and is unable to get the  proper compression she requires and cannot elevate her limbs appropriately at any given time. She has recently grown Pseudomonas from her wound culture but has not started her ciprofloxacin which was called in for her. 09/13/15 this continues to be a difficult situation for this patient. At one point I had this wound down to a 1.5 x 1.5" wound on her left leg. This is deteriorated and the right leg has reopened. She now has substantial wounds on her medial calcaneus, malleoli and into her lower leg. One on the left has  surface eschar but these are far too painful for me to debridement here. She has a vascular surgery appointment next week to see if anything can be done to help here. I think she has had previous ablations several years ago at [facility]. She has no major edema. She tells me that she did not get product last time Helen [facility]] and went for several days without it. She continues to work in work boots 12 hours a day. She cannot get compression/4-layer under her work boots. 09/20/15 no major change. Periwound edema control was not very good. Her point with pain and vascular is next Wednesday the 25th 09/28/15; the patient is seen vascular surgery and is apparently scheduled for repeat duplex ultrasounds of her bilateral lower legs next week. 10/05/15; the patient was seen by Dr. Doren Custard of vascular surgery. He feels that she should have arterial insufficiency excluded as cause/contributed to her nonhealing stage she is therefore booked for an arteriogram. She has apparently monophasic signals in the dorsalis pedis pulses. She also of course has known severe chronic venous insufficiency with previous procedures as noted previously. I had another long discussion with the patient today about her continuing to work 12 hour shifts. I've written her out for 2 months area had concerns about this as her work location is currently undergoing significant turmoil and this may lead to her termination. She  is aware of this however I agree with her that she simply cannot continue to stand for 12 hours multiple days a week with the substantial wound areas she has. 10/19/15; the Dr. Doren Custard appointment was largely for an arteriogram which was normal. She does not have an arterial issue. He didn't make a comment about her chronic venous insufficiency for which she has had previous ablations. Presumably it was not felt that anything additional could be done. The patient is now out of work as I prescribed 2 weeks ago. Her wounds look somewhat less aggravated presumably because of this. I felt I would give debridement another try today 10/25/15; no major change in this patient's wounds. We are struggling to get her product that she can afford into her own home through her insurance. 11/01/15; no major change in the patient's wounds. I have been using silver alginate as the most affordable product. I spoke to Dr. Marla Roe last week with her requested take her to the OR for surgical debridement and placement of ACEL. Dr. Marla Roe told me that she would be willing to do this however [facility] will not cover this, fortunately the patient has [place] healthcare of some variant 11/08/15; no major change in the patient's wounds. She has been completely nonviable surface that this but is in too much pain with any attempted debridement are clinic. I have arranged for her to see Dr. Marla Roe ham of plastic surgery and this appointment is on Monday. I am hopeful that they will take her to the OR for debridement, possible ACEL ultimately possible skin graft 11/22/15 no major change in the patient's wounds over her bilateral medial calcaneus medial malleolus into the lower legs. Surface on these does not look too bad however on the left there is surrounding erythema and tenderness. This may be cellulitis or could him sleepy tinea. 11/29/15; no major changes in the patient's wounds over her bilateral medial  malleolus. There is no infection here and I don't think any additional antibiotics are necessary. There is now plan to move forward. She sees Dr. Marla Roe in a week's time  for preparation for operative debridement and ACEL placement I believe on 7/12. She then has a follow-up appointment with Dr. Marla Roe on 7/21 12/28/15; the patient returns today having been taken to the [place] by Dr. Marla Roe 12/12/15 she underwent debridement, intraoperative cultures [which were negative]. She had placement of a wound VAC. Parent really ACEL was not available to be placed. The wound VAC foam apparently adhered to the wound since then she's been using silver alginate, Xeroform under Ace wraps. She still says there is a lot of drainage and a lot of pain 01/31/16; this is a patient I see monthly. I had referred her to Dr. Marla Roe him of plastic surgery for large wounds on her bilateral medial ankles. She has been to the OR twice once in early July and once in early August. She tells me over the last 3 weeks she has been using the wound VAC with ACEL underneath it. On the right we've simply been using silver alginate. Under Kerlix Coban wraps. 02/28/16; this is a patient I'm currently seeing monthly. She is gone on to have a skin graft over her large venous insufficiency ulcer on the left medial ankle. This was done by Dr. Marla Roe him. The patient is a bit perturbed about why she didn't have one on her right medial ankle wound. She has been using silver alginate to this. 03/06/16; I received a phone call from her plastic surgery Dr. Marla Roe. She expressed some concern about the viability of the skin graft she did on the left medial ankle wound. Asked me to place Endoform on this. She told me she is not planning to do a subsequent skin graft on the right as the left one did not take very well. I had placed Hydrofera Blue on the right 03/13/16; continue to have a reasonably healthy wound on the right medial ankle.  Down to 3 mm in terms of size. There is epithelialization here. The area on the left medial ankle is her skin graft site. I suppose the last week this looks somewhat better. She has an open area inferiorly however in the center there appears to be some viable tissue. There is a lot of surface callus and eschar that will eventually need to come off however none of this looked to be infected. Patient states that the is able to keep the dressing on for several days which is an improvement. 03/20/16 no major change in the circumference of either wound however on the left side the patient was at Dr. Eusebio Friendly office and they did a debridement of left wound. 50% of the wound seems to be epithelialized. I been using Endoform on the left Hydrofera Blue in the right 03/27/16; she arrives today with her wound is not looking as healthy as they did last week. The area on the right clearly has an adherent surface to this a very similar surface on the left. Unfortunately for this patient this is all too familiar problem. Clearly the Endoform is not working and will need to change that today that has some potential to help this surface. She does not tolerate debridement in this clinic very well. She is changing the dressing wants 04/03/16; patient arrives with the wounds looking somewhat better especially on the right. Dr. Migdalia Dk change the dressing to silver alginate when she saw her on Monday and also sold her some compression socks. The usefulness of the latter is really not clear and woman with severely draining wounds. 04/10/16; the patient is doing a bit of  an experiment wearing the compression stockings that Dr. Migdalia Dk provided her to her left leg and the out of legs based dressings that we provided to the right. 05/01/16; the patient is continuing to wear compression stockings Dr. Migdalia Dk provided her on the left that are apparently silver impregnated. She has been using Iodoflex to the right leg wound. Still  a moderate amount of drainage, when she leaves here the wraps only last for 4 days. She has to change the stocking on the left leg every night 05/15/16; she is now using compression stockings bilaterally provided by Dr. Marla Roe. She is wearing a nonadherent layer over the wounds so really I don't think there is anything specific being done to this now. She has some reduction on the left wound. The right is stable. I think all healing here is being done without a specific dressing 06/09/16; patient arrives here today with not much change in the wound certainly in diameter to large circular wounds over the medial aspect of her ankle bilaterally. Under the light of these services are certainly not viable for healing. There is no evidence of surrounding infection. She is wearing compression stockings with some sort of silver impregnation as prescribed by Dr. Marla Roe. She has a follow-up with her tomorrow. 06/30/16; no major change in the size or condition of her wounds. These are still probably covered with a nonviable surface. She is using only her purchase stockings. She did see Dr. Marla Roe who seemed to want to apply Dakin's solution to this I'm not extreme short what value this would be. I would suggest Iodoflex which she still has at home. 07/28/16; I follow Mrs. Glasner episodically along with Dr. Marla Roe. 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 She has been applying a topical collagen ointment to both wounds with Adaptic. I don't think Dr. Marla Roe is planning to take her back to the OR. 08/19/16; I follow Mrs. Jeneen Rinks on a monthly basis along with Dr. Marla Roe of plastic surgery. 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 I been following her for a number of years. At one point I was able to get the right medial malleolus wound to heal and had the left  medial malleolus down to about half its current size however and I had to send her to plastic surgery for an operative debridement. Since then things have been stable to slightly improve the area on the right is slightly better one in the left about the same although there is much less adherent surface than I'm used to with this patient. She is using some form of liquid collagen gel that Dr. Marla Roe provided a Kerlix cover with the patient's own pressure stockings. She tells me that she has extreme pain in both ankles and along the lateral aspect of both feet. She has been unable to work for some period of time. She is telling me she is retiring at the beginning of April. She sees Dr. Doran Durand of orthopedics next week 09/22/16; patient has not seen Dr. Marla Roe since the last time she is here. I'm not really sure what she is using to the wounds other than bits and pieces of think she had left over including most recently Hydrofera Blue. She is using juxtalite stockings. She is having difficulty with her husband's recent illness "stroke". She is having to transport him to various doctors appointments. Dr. Marla Roe left her the option of  a repeat debridement with ACEL however she has not been able to get the time to follow-up on this. She continues to have a fair amount of drainage out of these wounds with certainly precludes leaving dressings on all week 10/13/16; patient has not seen Dr. Marla Roe since she was last in our clinic. I'm not really sure what she is doing with the wounds, we did try to get her [facility] and I think she is actually using this most of the time. Because of drainage she states she has to change this every second day although this is an improvement from what she used to do. She went to see Dr. Doran Durand who did not think she had a muscular issue with regards to her feet, he referred her to a neurologist and I think the appointment is sometime in June. I changed her back to  Iodoflex which she has used in the past but not recently. 11/03/16; the patient has been using Iodoflex although she ran out of this. Still claims that there is a lot of drainage although the wound does not look like this. No surrounding erythema. She has not been back to see Dr. Marla Roe 11/24/16; the patient has been using Iodoflex again but she ran out of it 2 or 3 days ago. There is no major change in the condition of either one of these wounds in fact they are larger and covered in a thick adherent surface slough/nonviable tissue especially on the left. She does not tolerate mechanical debridement in our clinic. Going back to see Dr. Marla Roe of plastic surgery for an operative debridement would seem reasonable. 12/15/16; the patient has not been back to see Dr. Marla Roe. She is been dealing with a series of illnesses and her husband which of monopolized her time. She is been using Sorbact which we largely supplied. She states the drainage is bad enough that it maximum she can go 2-3 days without changing the dressing 01/12/2017 -- the patient has not been back for about 4 weeks and has not seen Dr. Marla Roe not does she have any appointment pending. 01/23/17; patient has not seen Dr. Marla Roe even though I suggested this previously. She is using Santyl that was suggested last week by Dr. Con Memos this Cost her $16 through her insurance which is indeed surprising 02/12/17; continuing Santyl and the patient is changing this daily. A lot of drainage. She has not been back to see plastic surgery she is using an Ace wrap. Our intake nurse suggested wrap around stockings which would make a good reasonable alternative 02/26/17; patient is been using Santyl and changing this daily due to drainage. She has not been to see plastic surgery she uses in April Ace wrap to control the edema. She did obtain extremitease stockings but stated that the edema in her leg was to big for these 03/20/17; patient is  using Santyl and Anasept. Surfaces looked better today the area on the right is actually measuring a little smaller. She has states she has a lot of pain in her feet and ankles and is asking for a consult to pain control which I'll try to help her with through our case manager. 04/10/17; the patient arrives with better-looking wound surfaces and is slightly smaller wound on the left she is using a combination of Santyl and Anasept. She has an appointment or at least as started in the pain control center associated with [facility] 05/14/17; this is a patient who I followed for a prolonged period  of time. She has venous insufficiency ulcers on her bilateral medial ankles. At one point I had this down to a much smaller wound on the left however these reopened and we've never been able to get these to heal. She has been using Santyl and Anasept gel although 2 weeks ago she ran out of the Anasept gel. She has a stable appearance of the wound. She is going to the wound care clinic at [facility]. They wanted do a nerve block/spinal block although she tells me she is reluctant to go forward with that. 05/21/17; this is a patient I have followed for many years. She has venous insufficiency ulcers on her bilateral medial ankles. Chronic pain and deformity in her ankles as well. She is been to see plastic surgery as well as orthopedics. Using PolyMem AG most recently/Kerramax/ABDs and 2 layer compression. She has managed to keep this on and she is coming in for a nurse check to change the dressing on Tuesdays, we see her on Fridays 06/05/17; really quite a good looking surface and the area especially on the right medial has contracted in terms of dimensions. Well granulated healthy-looking tissue on both sides. Even with an open curet there is nothing that even feels abnormal here. This is as good as I've seen this in quite some time. We have been using PolyMem AG and bringing her in for a nurse  check 06/12/17; really quite good surface on both of these wounds. The right medial has contracted a bit left is not. We've been using PolyMem and AG and she is coming in for a nurse visit 06/19/17; we have been using PolyMem AG and bringing her in for a nurse check. Dimensions of her wounds are not better but the surfaces looked better bilaterally. She complained of bleeding last night and the left wound and increasing pain bilaterally. She states her wound pain is more neuropathic than just the wounds. There was some suggestion that this was radicular from her pain management doctor in talking to her it is really difficult to sort this out. 06/26/17; using PolyMem and AG and bringing her in for a nurse check as All of this and reasonably stable condition. Certainly not improved. The dimensions on the lateral part of the right leg look better but not really measuring better. The medial aspect on the left is about the same. 07/03/16; we have been using PolyMen AG and bringing her in for a nurse check to change the dressings as the wounds have drainage which precludes once weekly changing. We are using all secondary absorptive dressings.our intake nurse is brought up the idea of using a wound VAC/snap VAC on the wound to help with the drainage to see if this would result in some contraction. This is not a bad idea. The area on the right medial is actually looking smaller. Both wounds have a reasonable-looking surface. There is no evidence of cellulitis. The edema is well controlled 07/10/17; the patient was denied for a snap VAC by her insurance. The major issue with these wounds continues to be drainage. We are using wicked PolyMem AG and she is coming in for a nurse visit to change this. The wounds are stable to slightly improved. The surface looks vibrant and the area on the right certainly has shrunk in size but very slowly 07/17/17; the patient still has large wounds on her bilateral medial malleoli.  Surface of both of these wounds looks better. The dimensions seem to come and go but  no consistent improvement. There is no epithelialization. We do not have options for advanced treatment products due to insurance issues. They did not approve of the wound VAC to help control the drainage. More recently we've been using PolyMem and AG wicked to allow drainage through. We have been bringing her in for a nurse visit to change this. We do not have a lot of options for wound care products and the home again due to insurance issues 07/24/17; the patient's wound actually looks somewhat better today. No drainage measurements are smaller still healthy-looking surface. We used silver collagen under PolyMen started last week. We have been bringing her in for a dressing change 07/31/17; patient's wound surface continued to look better and I think there is visible change in the dimensions of the wound on the right. Rims of epithelialization. We have been using silver collagen under PolyMen and bringing her in for a dressing change. There appears to be less drainage although she is still in need of the dressing change 08/07/17. Patient's wound surface continues to look better on both sides and the area on the right is definitely smaller. We have been using silver collagen and PolyMen. She feels that the drainage has been it has been better. I asked her about her vascular status. She went to see Dr. Aleda Grana at [facility] and had some form of ablation. I don't have much detail on this. I haven't my notes from 2016 that she was not a candidate for any further ablation but I don't have any more information on this. We had referred her to vein and vascular I don't think she ever went. He does not have a history of PAD although I don't have any information on this either. We don't even have ABIs in our record 08/14/17; we've been using silver collagen and PolyMen cover. And putting the patient and compression. She we  are bringing her in as a nurse visit to change this because ofarge amount of drainage. We didn't the ABIs in clinic today since they had been done in many moons 1.2 bilaterally. She has been to see vein and vascular however this was at [facility] and she had ablation although I really don't have any information on this all seemed biking get a report. She is also been operatively debrided by plastic surgery and had a cell placed probably 8-12 months ago. This didn't have a major effect. We've been making some gains with current dressings 08/19/17-She is here in follow-up evaluation for bilateral medial malleoli ulcers. She continues to tolerate debridement very poorly. We will continue with recently changed topical treatment; if no significant improvement may consider switching to Iodosorb/Iodoflex. 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 She has been using silver collagen and PolyMem. I believe she has been used and tried on Iodoflex before. 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 These have been very refractory over time. We have been using silver collagen and PolyMen. She is coming in once a week for a doctor's and once a week for nurse visits. We are actually making some progress 09/18/17; the patient's wounds are smaller especially on the right medial. She arrives today to upset to consider even washing these off with Anasept which I think is been part of the reason this  is been closing. We've been using collagen covered in PolyMen otherwise. It is noted that she has a small area of folliculitis on the right medial calf that. As we are wrapping her legs I'll give her a short course of doxycycline to make sure this doesn't amount to anything. She is a  long list of complaints today including imbalance, shortness of breath on exertion, inversion of her left ankle. With regards to the latter complaints she is been to see orthopedics and they offered her a tendon release surgery I believe but wanted her wounds to be closed first. I have recommended she go see her primary physician with regards to everything else. 09/25/17; patient's wounds are about the same size. We have made some progress bilaterally although not in recent weeks. She will not allow me T wash these wounds with Anasept even if she is doing her cell. Wheeze we've been using collagen covered in PolyMen. Last week she had a small area of folliculitis this is now opened into a small wound. She completed 5 days of trimethoprim sulfamethoxazole 10/02/17; unfortunately the area on her left medial ankle is worse with a larger wound area towards the Achilles. The patient complains of a lot of pain. She will not allow debridement although visually I don't think there is anything to debridement in any case. We have been using silver collagen and PolyMen for several months now. Initially we are making some progress although I'm not really seeing that today. We will move back to [facility]. His admittedly this is a bit of a repeat however I'm hoping that his situation is different now. The patient tells me she had her leg on the left give out on her yesterday this is process some pain. 10/09/17; the patient is seen twice a week largely because of drainage issues coming out of the chronic medial bimalleolar wounds that are chronic. Last week the dimensions of the one on the left looks a little larger I changed her to [facility]. She comes in today with a history of terrible pain in the bilateral wound areas. She will not allow debridement. She will not even allow a tissue culture. There is no surrounding erythema no no evidence of cellulitis. We have been putting her Kerlix Coban man. She will  not allow more aggressive compression as there was a suggestion to put her in 3 layer wraps. 10/16/17; large wounds on her bilateral medial malleoli. These are chronic. Not much change from last week. The surface looks have healthy but absolutely no epithelialization. A lot of pain little less so of drainage. She will not allow debridement or even washing these off in the vigorous fashion with Anasept. 10/23/17; large wounds on her bilateral malleoli which are chronic. Some improvement in terms of size perhaps on the right since last time I saw these. She states that after we increased the 3 layer compression there was some bleeding, when she came in for a nurse visit she did not want 3 layer compression put back on about our nurse managed to convince her. She has known chronic venous visit issues and I'm hoping to get her to tolerate the 3 layer compression. using Hydrofera Blue 10/30/17; absolutely no change in the condition of either wound although we've had some improvement in dimensions on the right.. Attempted to put her in 3 layer compression she didn't tolerated she is back in 2 layer compression. We've been using Hydrofera Blue We looked over her past records. She had venous  reflux studies in November 2016. There was no evidence of deep venous reflux on the right. Superficial vein did not show the greater saphenous vein at think this is been previously ablated the small saphenous vein was within normal limits. The left deep venous system showed no DVT the vessels were positive for deep venous reflux in the posterior tibial veins at the ankle. The greater saphenous vein was surgically absent small saphenous vein was within normal limits. She went to vein and vascular at [facility]. I believe she had an ablation on the left greater saphenous vein. I'll update her reflux studies perhaps ever reviewed by vein and vascular. We've made absolutely no progress in these wounds. Will also try to read  and TheraSkins through her insurance 11/06/17; W the patient apparently has a 2 week follow-up with vein and vascular I like him to review the whole issue with regards to her previous vascular workup by Dr. Aleda Grana. We've really made no progress on these wounds in many months. She arrives today with less viable looking surface on the left medial ankle wound. This was apparently looking about the same on Tuesday when she was here for nurse visit. 11/13/17; deep tissue culture I did last time of the left lower leg showed multiple organisms without any predominating. In particular no Staphylococcus or group A strep were isolated. We sent her for venous reflux studies. She's had a previous left greater saphenous vein stripping and I think sclerotherapy of the right greater saphenous vein. She didn't really look at the lesser saphenous vein this both wounds are on the medial aspect. She has reflux in the common femoral vein and popliteal vein and an accessory vein on the right and the common femoral vein and popliteal vein on the left. I'm going to have her go to see vein and vascular just the look over things and see if anything else beside aggressive compression is indicated here. We have not been able to make any progress on these wounds in spite of the fact that the surface of the wounds is never look too bad. 11/20/17; no major change in the condition of the wounds. Patient reports a large amount of drainage. She has a lot of complaints of pain although enlisting her today I wonder if some of this at least his neuropathic rather than secondary to her wounds. She has an appointment with vein and vascular on 12/30/17. The refractory nature of these wounds in my mind at least need vein and vascular to look over the wounds the recent reflux studies we did and her history to see if anything further can be done here. I also note her gait is deteriorated quite a bit. Looks like she has inversion of her foot  on the right. She has a bilateral Trendelenburg gait. I wonder if this is neuropathic or perhaps multilevel radicular. 11/27/17; her wounds actually looks slightly better. Healthy-looking granulation tissue a scant amount of epithelialization. [place] [facility] will not pay for [employer]. They will play for tri layer Oasis and Dermagraft. This is not a diabetic ulcer. We'll try for the tri layer Oasis. She still complains of some drainage. She has a vein and vascular appointment on 12/30/17 12/04/17; the wounds visually look quite good. Healthy-looking granulation with some degree of epithelialization. We are still waiting for response to our request for trial to try layer Oasis. Her appointment with vascular to review venous and arterial issues isn't sold the end of July 7/31. Not allow debridement or even  vigorous cleansing of the wound surface. 12/18/17; slightly smaller especially on the right. 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 We've been using Hydrofera Blue. We still are looking into triple layer Oasis through her insurance 01/08/18 on evaluation today patient's wound actually appears to be showing signs of good improvement at this point in time. She has been tolerating the dressing changes without complication. Fortunately there does not appear to be any evidence of infection at this point in time. We have been utilizing silver nitrate which does seem to be of benefit for her which is also good news. Overall I'm very happy with how things seem to be both regards appearance as well as measurement. Patient did see Dr. Bridgett Larsson for evaluation on 12/30/17. In his assessment he felt that stripping would not likely add much more than chronic compression to the patient's healing process. His recommendation was to follow-up in three months with Dr. Doren Custard if she hasn't healed in order to consider referral back to you and see vascular where she previously was in a trial and was  able to get her wound to heal. I'll be see what she feels she when you staying compression and he reiterated this as well. 01/13/18 on evaluation today patient appears to actually be doing very well in regard to her bilateral medial malleolus ulcers. She seems to have tolerated the chemical cauterization with silver nitrate last week she did have some pain through that evening but fortunately states that I'll be see since it seems to be doing better she is overall pleased with the progress. 01/21/18; really quite a remarkable improvement since I've last seen these wounds. We started using silver nitrate specially on the islands of hyper granulation which for some reason her around the wound circumference. This is really done quite nicely. Primary dressing Hydrofera Blue under 4 layer compression. She seems to be able to hold out without a nurse rewrap. Follow-up in 1 week 01/28/18; we've continued the hydrofera blue but continued with chemical cauterization to the wound area that we started about a month ago for irregular hyper granulation. She is made almost stunning improvement in the overall wound dimensions. I was not really expecting this degree of improvement in these chronic wounds 02/05/18; we continue with Hydrofera Bluebut of also continued the aggressive chemical cauterization with silver nitrate. We made nice progress with the right greater than left wound. 02/12/18. We continued with Hydrofera Blue after aggressive chemical cauterization with silver nitrate. We appear to be making nice progress with both wound areas 02/19/2018; we continue with [facility] after washing the wounds vigorously with Anasept spray and chemical cauterization with silver nitrate. We are making excellent progress. The area on the right's just about closed 02/26/2018. The area on the left medial ankle had too much necrotic debris today. I used a #5 curette we are able to get most of the soft. I continued with  the silver nitrate to the much smaller wound on the right medial ankle she had a new area on her right lower pretibial area which she says was due to a role in her compression 03/05/2018; both wound areas look healthy. Not much change in dimensions from last week. I continue to use silver nitrate and Hydrofera Blue. The patient saw Dr. Doren Custard of vein and vascular. He felt she had venous stasis ulcers. He felt based on her previous arteriogram she should have adequate circulation for healing. Also she has deep venous reflux but really no significant correctable superficial  venous reflux at this time. He felt we should continue with conservative management including leg elevation and compression 04/02/2018; since we last saw this woman about a month ago she had a fall apparently suffered a pelvic fracture. I did not look up the x-ray. Nevertheless because of pain she literally was bedbound for 2 weeks and had home health coming out to change the dressing. Somewhat predictably this is resulted in considerable improvement in both wound areas. The right is just about closed on the medial malleolus and the left is about half the size. 04/16/2018; both her wounds continue to go down in size. Using Hydrofera Blue. 05/07/18; both her wounds appeared to be improving especially on the right where it is almost closed. We are using Hydrofera Blue 05/14/2018; slightly worse this week with larger wounds. Surface on the left medial not quite as good. We have been using Hydrofera Blue 05/21/18; again the wounds are slightly larger. Left medial malleolus slightly larger with eschar around the circumference. We have been using Hydrofera Blue undergoing a wraps for a prolonged period of time. This got a lot better when she was more recumbent due to a fall and a back injury. I change the primary dressing the silver alginate today. She did not tolerate a 4 layer compression previously although I may need to bring this up with  her next time 05/28/2018; area on the left medial malleolus again is slightly larger with more drainage. Area on the right is roughly unchanged. She has a small area of folliculitis on the right medial just on the lower calf. This does not look ominous. 06/03/2018 left medial malleolus slightly smaller in a better looking surface. We used silver nitrate on this last time with silver alginate. The area on the right appears slightly smaller 1/10; left medial malleolus slightly smaller. Small open area on the right. We used silver nitrate and silver alginate as of 2 weeks ago. We continue with the wound and compression. These got a lot better when she was off her feet 1/17; right medial malleolus wound is smaller. The left may be slightly smaller. Both surfaces look somewhat better. 1/24; both wounds are slightly smaller. Using silver alginate under Unna boots 1/31; both wounds appear smaller in fact the area on the right medial is just about closed. Surface eschar. We have been using silver alginate under Unna boots. The patient is less active now spends let much less time on her feet and I think this is contributed to the general improvement in the wound condition 2/7; both wounds appear smaller. I was hopeful the right medial would be closed however there there is still the same small open area. Slight amount of surface eschar on the left the dimensions are smaller there is eschar but the wound edges appear to be free. We have been using silver alginate under Unna boot's 2/14; both wounds once again measure smaller. Circumferential eschar on the left medial. We have been using silver alginate under Unna boots with gradual improvement 2/21; the area on the right medial malleolus has healed. The area on the left is smaller. We have been using silver alginate and Unna boots. We can discharge wrapping the right leg she has 20/30 stockings at home she will need to protect the scar tissue in this area 2/28;  the area on the right medial malleolus remains closed the patient has a compression stocking. The area on the left is smaller. We have been using silver alginate and Unna boots. 3/6  the area on the right medial ankle remains closed. Good edema control noted she is using her own compression stocking. The area on the left medial ankle is smaller. We have been managing this with silver alginate and Unna boots which we will continue today. 3/13; the area on the right medial ankle remains closed and I'm declaring it healed today. When necessary the left is about the same still a healthy-looking surface but no major change and wound area. No evidence of infection and using silver alginate under unna and generally making considerable improvement 3/27 the area on the right medial ankle remains closed the area on the left is about the same as last week. Certainly not any worse we have been using silver alginate under an Unna boot 4/3; the area on the right medial ankle remains closed per the patient. We did not look at this wound. The wound on the left medial ankle is about the same surface looks healthy we have been using silver alginate under an Unna boot 4/10; area on the right medial ankle remains closed per the patient. We did not look at this wound. The wound on the left medial ankle is slightly larger. The patient complains that the [facility] caused burning pain all week. She also told us that she was a lot more active this week. Changed her back to silver alginate 4/17; right medial ankle still closed per the patient. Left medial ankle is slightly larger. Using silver alginate. She did not tolerate Hydrofera Blue on this area 4/24; right medial ankle remains closed we have not look at this. The left medial ankle continues to get larger today by about a centimeter. We have been using silver alginate under Unna boots. She complains about 4 layer compression as an alternative. She has been up on her  feet working on her garden 5/8; right medial ankle remains closed we did not look at this. The left medial ankle has increased in size about 100%. We have been using silver alginate under Unna boots. She noted increased pain this week and was not surprised that the wound is deteriorated 5/15; no major change in SA however much less erythema ( one week of doxy ocellulitis). 5/22-65 year old female returns at 1 week to the clinic for left medial ankle wound for which we have been using silver alginate under 3 layer compression She was placed on DOXY at last visit - the wound is wider at this visit. She is in 3 layer compression 5/29; change to [facility] last week. I had given her empiric doxycycline 2 weeks ago for a week. She is in 3 layer compression. She complains of a lot of pain and drainage on presentation today. 6/5; using Hydrofera Blue. I gave her doxycycline recently empirically for erythema and pain around the wound. Believe her cultures showed enterococcus which not would not have been well covered by doxycycline nevertheless the wound looks better and I don't feel specifically that the enterococcus needs to be covered. She has a new what looks like a wrap injury on her lateral left ankle. 6/12; she is using Hydrofera Blue. She has a new area on the left anterior lower tibial area. This was a wrap injury last week. 6/19; the patient is using Hydrofera Blue. She arrived with marked inflammation and erythema around the wound and tenderness. 12/01/18 on evaluation today patient appears to be doing a little bit better based on what I'm hearing from the standpoint of lassos evaluation to this as far  as the overall appearance of the wound is concerned. Then sometime substandard she typically sees Dr. Dellia Nims. Nonetheless overall very pleased with the progress that she's made up to this point. No fevers, chills, nausea, or vomiting noted at this time. 7/10; some improvement in the surface area.  Aggressively debrided last week apparently. I went ahead with the debridement today although the patient does not tolerate this very well. We have been using Iodoflex. Still a fair amount of drainage 7/17; slightly smaller. Using Iodoflex. 7/24; no change from last week in terms of surface area. We have been using Iodoflex. Surface looks and continues to look somewhat better 7/31; surface area slightly smaller better looking surface. We have been using Iodoflex. This is under Unna boot compression 8/7-Patient presents at 1 week with Unna boot and Iodoflex, wound appears better 8/14-Patient presents at 1 week with Iodoflex, we use the Unna boot, wound appears to be stable better.Patient is getting Botox treatment for the inversion of the foot for tendon release, Next week 8/21; we are using Iodoflex. Unna boot. The wound is stable in terms of surface area. Under illumination there is some areas of the wound that appear to be either epithelialized or perhaps this is adherent slough at this point I was not really clear. It did not wipe off and I was reluctant to debride this today. 8/28; we are using Iodoflex in an Unna boot. Seems to be making good improvement. 9/4; using Iodoflex and wound is slightly smaller. 9/18; we are using Iodoflex with topical silver nitrate when she is here. The wound continues to be smaller 10/2; patient missed her appointment last week due to GI issues. She left and Iodoflex based dressing on for 2 weeks. Wound is about the same size about the size of a dime on the left medial lower 10/9 we have been using Iodoflex on the medial left ankle wound. She has a new superficial probable wrap injury on the dorsal left ankle 10/16; we have been using Hydrofera Blue since last week. This is on the left medial ankle 10/23; we have been using Hydrofera Blue since 2 weeks ago. This is on the left medial ankle. Dimensions are better 11/6; using Hydrofera Blue. I think the wound is  smaller but still not closed. Left medial ankle 11/13; we have been using Hydrofera Blue. Wound is certainly no smaller this week. Also the surface not as good. This is the remanent of a very large area on her left medial ankle. 11/20; using Sorbact since last week. Wound was about the same in terms of size although I was disappointed about the surface debris 12/11; 3-week follow-up. Patient was on vacation. Wound is measuring slightly larger we have been using Sorbact. 12/18; wound is about the same size however surface looks better last week after debridement. We have been using Sorbact under compression 1/15 wound is probably twice the size of last time increased in length nonviable surface. We have been using Sorbact. She was running a mild fever and missed her appointment last week 1/22; the wound is come down in size but under illumination still a very adherent debris we have been Hydrofera Blue that I changed her to last week 1/29; dimensions down slightly. We have been using Hydrofera Blue 2/19 dimensions are the same however there is rims of epithelialization under illumination. Therefore more the surface area may be epithelialized 2/26; the patient's wound actually measures smaller. The wound looks healthy. We have been using Hydrofera Blue. I had  some thoughts about running Apligraf then I still may do that however this looks so much better this week we will delay that for now 3/5; the wound is small but about the same as last week. We have been using Hydrofera Blue. No debridement is required today. 3/19; the wound is about the size of a dime. Healthy looking wound even under illumination. We have been using Hydrofera Blue. No mechanical debridement is necessary 3/26; not much change from last week although still looks very healthy. We have been using Hydrofera Blue under Unna boots Patient was offered an ankle fusion by podiatry but not until the wound heals with a proceed with  this. 4/9; the patient comes in today with her original wound on the medial ankle looking satisfactory however she has some uncontrolled swelling in the middle part of her leg with 2 new open areas superiorly just lateral to the tibia. I think this was probably a wrap issue. She said she felt uncomfortable during the week but did not call in. We have been using Hydrofera Blue 4/16; the wound on the medial ankle is about the same. She has innumerable small areas superior to this across her mid tibia. I think this is probably folliculitis. She is also been working in the yard doing a lot of sweating 4/30; the patient issue on the upper areas across her mid tibia of all healed. I think this was excessive yard work if I remember. Her wound on the medial ankle is smaller. Some debris on this we have been using Hydrofera Blue under Unna boots 5/7; mid tibia. She has been using Hydrofera Blue under an Unna wrap. She is apparently going for her ankle surgery on June 3 10/28/19-Patient returns to clinic with the ankle wound, we are using Hydrofera Blue under Unna wrap, surgery is scheduled for her left foot for June 3 so she will be back for nurse visit next week READMISSION 01/17/2020 Mrs. Eckart is a 65 year old woman we have had in this clinic for a long period of time with severe venous hypertension and refractory wounds on her medial lower legs and ankles bilaterally. 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 When she was off her legs for a prolonged period example when she fell and suffered a compression fracture things would heal up quite nicely. She is now retired and we managed to heal up the right medial leg wound. The left one was very tiny last time I saw this although still refractory. 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 It got to the point where this was interfering with her walking and she elected to proceed with a ankle arthrodesis to straighten her her ankle and leave her with a functional outcome for mobilization. The patient was referred to Dr. Doren Custard and really this took some time to arrange. Dr. Doren Custard saw her on 12/07/2019. Once again he verified that she had no arterial issues. She had previously had an angiogram several years ago. Follow-up ABIs on the left showed an ABI of 1.12 with triphasic waveforms and a TBI of 0.92. She is felt to have chronic deep venous insufficiency but I do not think it was felt that anything could be done from about this from an ablation point of view. At the time  Dr. Doren Custard saw this patient the wounds actually look closed via the pictures in his clinic. The patient finally underwent her surgery on 12/15/2019. This went reasonably well and there was a good anatomic outcome. She developed a small distal wound dehiscence on the lateral part of the surgical wound. However more problematically she is developed recurrence of the wound on the medial left ankle. There are actually 2 wounds here one in the distal lower leg and 1 pretty much at the level of the medial malleolus. It is a more distal area that is more problematic. She has been using Hydrofera Blue which started on Friday before this she was simply Ace wrapping. There was a culture done that showed Pseudomonas and she is on ciprofloxacin. A recent CNS on 8/11 was negative. The patient reports some pain but I generally think this is improving. She is using a cam boot completely nonweightbearing using a walker for pivot transfers and a wheelchair 8/24; not much improvement unfortunately she has a surgical wound on the lateral part in the venous insufficiency wound medially. The bottom part of the medial insufficiency wound is still necrotic there is exposed tendon here. We have been using  Hydrofera Blue under compression. Her edema control is however better 8/31; patient in for follow-up of his surgical wound on the lateral part of her left leg and chronic venous insufficiency ulcers medially. We put her back in compression last week. She comes in today with a complaint of 3 or 4 days worth of increasing pain. She felt her cam walker was rubbing on the area on the back of her heel. However there is intense erythema seems more likely she has cellulitis. She had 2 cultures done when she was seeing podiatry in the postop. One of them in late July showed Pseudomonas and she received a course of ciprofloxacin the other was negative on 8/11 she is allergic to penicillin with anaphylactoid complaints of hives oral swelling via information in epic 9/9; when I saw this patient last week she had intense anterior erythema around her wound on the right lateral heel and ankle and also into the right medial heel. Some of this was no doubt drainage and her walker boot however I was convinced she had cellulitis. I gave her Levaquin and Bactrim she is finishing up on this now. She is following up with Dr. [name] he saw her yesterday. He is taken her out of the walking boot of course she is still nonweightbearing. Her x-ray was negative for any worrisome features such as soft tissue air etc. Things are a lot better this week. She has home health. We have been using Hydrofera Blue under an [employer] which she put back on yesterday. I did not wrap her last week 9/17; her surrounding skin looks a lot better. In fact the area on the left lateral ankle has just a scant amount of eschar. The only remaining wound is the large area on the left medial ankle. Probably about 60% of this is healthy granulation at the surface however she has a significant divot distally. This has adherent debris in it. I been using debridement and silver collagen to try and get this area to fill-in although I do not think we have made  much progress this week 9/24; the patient's wound on the left medial ankle looks a lot better. The deeper divot area distally still requires debridement but this is cleaning up quite nicely we have been using silver collagen. The patient is  complaining of swelling in her foot and is worried that that is contributing to the nonhealing of the ankle wound. She is also complaining of numbness in her anterior toes 10/4; left medial ankle. The small area distally still has a divot with necrotic material that I have been debriding away. This has an undermining area. She is approved for Apligraf. She saw Dr. [name] her surgeon on 10/1. I think he declared himself is satisfied with the condition of things. Still nonweightbearing till the end of the month. 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 Her surgical wound is well closed. There is no evidence of infection 10/11; the patient arrived in clinic today with the expectation that we be able to put an Apligraf on this area after debridement however she arrives with a relatively large amount of green drainage on the dressing. The patient states that this started on Friday. She has not been systemically unwell. 10/19; culture I did last week showed both Enterococcus and Pseudomonas. I think this came in separate parts because I stopped her ciprofloxacin I gave her and prescribed her linezolid however now looking at the final culture result this is Pseudomonas which is resistant to quinolones. She has not yet picked up the linezolid apparently phone issues. We are also trying to get a topical antibiotic out of [facility] in [place] they can be applied by home health. She is still having green drainage 10/16; the patient has her topical antibiotic from [facility] in [place]. This is a compounded gel with vancomycin and ciprofloxacin and gentamicin. We are applying this on the wound bed with silver alginate over the top  with Unna boot wraps. She arrives in clinic today with a lot less ominous looking drainage although she is only use this topical preparation once the second time today. She sees Dr. [name] her surgeon on Friday she has home health changing the dressing 11/2; still using her compounded topical antibiotic under silver alginate. Surface is cleaning up there is less drainage. We had an Apligraf for her today and I elected to apply it. A light coating of her antibiotic 04/25/2020 upon evaluation today patient appears to be doing well with regard to her ankle ulcer. There is a little bit of slough noted on the surface of the wound I am can have to perform sharp debridement to clear this away today. With that being said other than that fact overall I feel like she is making progress and we do see some new epithelial growth. There is also some improvement in the depth of the wound and that distal portion. There is little bit of slough there as well. 12/7; 2-week follow-up. Apligraf #3. Dimensions are smaller. Closing in especially inferiorly. Still some surface debris. Still using the [facility] topical antibiotic but I told her that I don't think this needs to be renewed 12/21; 2-week follow-up. Apligraf #4 dimensions are smaller. Nice improvement 06/05/2020; 2-week follow-up. The patient's wound on the left medial ankle looks really excellent. Nice granulation. Advancing epithelialization no undermining no evidence of infection. We would have to reapply for another Apligraf but with the condition of this wound I did not feel strongly about it. We used Hydrofera Blue under the same degree of compression. She follows up with Dr. [name] her surgeon a week Friday 06/13/2020 upon evaluation today patient appears to be doing excellent in regard to her wound. She has been tolerating the dressing changes without complication. Fortunately there is no signs  of active infection at this time. No fevers, chills, nausea,  vomiting, or diarrhea. She was using Hydrofera Blue last week. 06/20/2020 06/20/2020 on evaluation today patient actually appears to be doing excellent in regard to her wound. This is measuring better and looking much better as well. She has been using the collagen that seems to be doing better for her as well even though the [facility] was and is not sticking or feeling as rough on her wound. She did see Dr. [name] on Friday he is very pleased he also stated none of the hardware has shifted. That is great news 1/27; the patient has a small clean wound all that is remaining. I agree that this is too small to really consider an Apligraf. Under illumination the surface is looking quite good. We have been using collagen although the dimensions are not any better this week 2/2; the patient has a small clean wound on the left medial ankle. Although this left of her substantial original areas. Measurements are smaller. We have been using polymen Ag under an Haematologist. 2/10; small area on the left medial ankle. This looks clean nothing to debride however dimensions are about the same we have been using polymen I think now for 2 weeks 2/17; not much change in surface area. We have been using polymen Ag without any improvement. 3/17; 1 month follow-up. The patient has been using endoform without any improvement in fact I think this looks worse with more depth and more expansion 3/24; no improvement. Perhaps less debris on the surface. We have been using Sorbact for 1 week 4/4; wound measures larger. She has edema in her leg and her foot which she tells as her wrap came down. We have been using Unna boots. Sorbact of the wound. She has been approved for Apligraf 09/12/2020 upon evaluation today patient appears to be doing well with regard to her wound. We did get the Apligraf reapproved which is great news we have that available for application today. Fortunately there is no signs of infection and overall the  patient seems to be doing great. The wound bed is nice and clean. 4/27; patient presents for her second application of Apligraf. She states over the past week she has been on her feet more often due to being outside in her garden. She has noted more swelling to her foot as a result. She denies increased warmth, pain or erythema to the wound site. 10/10/2020 upon evaluation today patient appears to be doing well with regard to her wound which does not appear to be quite as irritated as last week from what I am hearing. With that being said unfortunately she is having issues with some erythema and warmth to touch as well as an increase size. I do believe this likely is infected. 10/17/2020 upon evaluation today patient appears to be doing excellent in regard to her wound this is significantly improved as compared to last week. Fortunately I think that the infection is much better controlled at this point. She did have evidence of both Enterococcus as well as Staphylococcus noted on culture. Enterococcus really would not be helped significantly by the Cipro but the wound is doing so much better I am under the assumption that the Staphylococcus is probably the main organism that is causing the current infection. Nonetheless I think that she is doing excellent as far as that is concerned and I am very pleased in that regard. I would therefore recommend she continue with the Cipro.  10/31/2020 upon evaluation today patient appears to be doing well with regard to her wound. She has been tolerating the dressing changes without complication. Fortunately there is no signs of active infection and overall I am extremely pleased with where things stand today. No fevers, chills, nausea, vomiting, or diarrhea. With that being said she does have some green drainage coming from the wound and although it looks okay I am a little concerned about the possibility of a continuing infection. Specifically with Pseudomonas. For  that reason I will go ahead and send in a prescription for Cipro for her to be continued. 11/14/2020 upon evaluation today patient appears to be doing very well currently in regard to her wound on her leg. She has been tolerating the dressing changes without complication. Fortunately I feel like the infection is finally under good control here. Unfortunately we do not have the Apligraf for application today although we can definitely order to have it in place for next week. That will be her fifth and final of the current series. Nonetheless I feel like her wound is really doing quite well which is great news. 11/21/2020 upon evaluation today patient appears to be doing well with regard to her wound on the medial ankle. Fortunately I think the infection is under control and I do believe we can go ahead and reapply the Apligraf today. She is in agreement with that plan. There does not appear to be any signs of active infection at this time which is great news. No fevers, chills, nausea, vomiting, or diarrhea. 12/05/2020 upon evaluation today patient's wound bed actually showed signs of good granulation epithelization at this point. There does not appear to be any signs of infection which is great news and overall very pleased with where things stand. Overall the patient seems to be doing fairly well in my opinion with regard to her wound although I do believe she continues to build up a lot of biofilm I think she could benefit from using PuraPly at this point. 12/12/2020 upon evaluation today patient's wound actually appears to be doing decently well today. The Unna boot has not been quite as well-tolerated so that more uncomfortable for her and even causing some pressure over the plate on the lateral portion of her foot which is 90 where the wound is. There did not appear to be any significant deep tissue injury with that there may be a minimal change in the skin noted I think that we may want to go back to  the Coflex 2 layer which is a little bit easier on her skin it seems. 12/19/2020 upon evaluation today patient actually seems to be making great progress with the PuraPly currently. She in fact seems to be much better as far as the overall appearance of the wound bed is concerned I am very happy in this regard. I do not see any signs of of infection which is great news as well. No fevers, chills, nausea, vomiting, or diarrhea. 12/26/2020 upon evaluation today patient appears to actually be doing better in regard to her wound on the left medial ankle region. The surface of the wound is actually doing significantly better which is great news. There does not appear to be any signs of infection which is also great news and in general I am extremely pleased with where we stand today. 01/02/2021 upon evaluation today patient appears to be doing well with regard to her wound. In fact this is showing signs of excellent improvement and very  pleased with where things stand. In fact the last 3 appointments have all shown signs of this getting smaller which is excellent news. I have not even had to perform any debridement and today is no exception. Overall I feel like this is dramatically improved compared to previous. T oday is PuraPly application #4. 1/63/8466 upon evaluation today patient appears to be doing excellent in regard to her wound this is continue to show signs of improvement and overall I am extremely pleased with where we stand today. She is actually here for PuraPly application #5. Every time we have applied this she is noted definite improvement on measurements. 01/23/2021 upon evaluation today patient is actually making good progress in regard to her wound. This was actually on just a little bit longer this time compared to previous due to the fact that she did have to go out of town. She is actually here for PuraPly application #6. We have definitely been seeing improvements in the overall quality  of the tissue on the surface of the wound which is awesome news. In general I think that the patient seems to be continuing to make great progress here. 01/30/2021 upon evaluation today patient's wound is actually doing excellent. There is really not any significant biofilm buildup which is great news and overall I am extremely pleased with where things stand today. There does not appear to be any signs of active infection. No fevers, chills, nausea, vomiting, or diarrhea. 02/06/2021 upon evaluation today patient's wound is actually showing signs of excellent improvement which is great news. We continue to see the benefit of the PuraPly this is doing a great job the wound seems not really irritated whatsoever and is showing signs of good granulation at this point. Overall I am extremely happy with what we are seeing. The patient likewise is happy to hear all of this as well. 02/13/2021 upon evaluation today patient appears to be doing well with regard to her wound. This again is measuring smaller today and I am very pleased in this regard. Fortunately there does not appear to be any signs of active infection at this time which is good news from a systemic standpoint. Locally there is still some significant drainage which she does have is concerned about infection locally. No fevers, chills, nausea, vomiting, or diarrhea. 02/20/2021 upon evaluation today patient actually appears to be doing decently well in regard to her wound. She has been tolerating the dressing changes without complication. I do believe the PuraPly is helping wound bed does appear to be doing better. There is no evidence of active infection locally or systemically at this point visually although on fluorescence imaging there still appear to be bacterial load and bioburden noted. 02/27/2021 upon evaluation today patient fortunately continues to show signs of improvement with use of the PuraPly currently. Subsequently we did review  her culture results and to be honest I think that the Bactrim is probably the best option to have her continue at this point. For that reason I am get a go ahead and send in a refill today for her for an additional 2 weeks. Nonetheless I think that we are making excellent progress here. It was Enterococcus and Staphylococcus that were noted she is allergic to penicillin so there is not much I can do from the Enterococcus standpoint though the staff does seem to be sensitive to the Bactrim. 03/06/2021 upon evaluation today patient appears to be doing well with regard to her wound. This is definitely showing signs  of improvement which is great news and overall I am extremely pleased with where things stand today. There does not appear to be any signs of active infection which is great news and overall and I do believe that we are headed in the appropriate direction I think the PuraPly is doing an awesome job for her. 03/20/2021 upon evaluation today patient actually appears to be doing well with regard to her wound. We have been using the PuraPly although last week when I was out of the office they actually just used endoform nonetheless this still seems to be doing great. Fortunately there does not appear to be any signs of active infection which is great news overall I think the patient is making wonderful progress. 03/27/2021 upon evaluation today patient appears to be doing well with regard to her wound in fact there is a little drainage but otherwise her does not appear to be any major signs of open wounds nor infection at this time. Overall I am extremely pleased with where things stand. No fevers, chills, nausea, vomiting, or diarrhea. 04/03/2021 upon evaluation today patient presents for reevaluation here in the clinic and overall she seems to be doing quite well. Fortunately there does not appear to be any signs of active infection which is great news and in general I am very pleased with how  things appear. There is still a very small opening remaining but in general she is looking much better. Fortunately I do not think there is any need for the PuraPly at this point. 04/10/2021 upon evaluation today patient's wound is actually showing signs of excellent improvement. I am actually very pleased with where it stands today. I think she is very close to complete closure in fact this may be healed today. Either way I think that she may benefit from using her own compression socks for the next week and then will subsequently see how things stand following. 04/17/2021 upon evaluation today patient appears to be doing well with regard to her wound on the ankle region. This is actually showing signs of excellent improvement which is great news. I do not see any signs of active infection at this time which is great and no open wound. With that being said on the lateral portion of her ankle right at the base of her plate she actually has an area of pressure that occurred over the past week when she was not in the wrap or we been padding her. This does seem to be an issue which I think is good to be an ongoing problem I am can actually get in touch with Dr. [name] today to see what we can do in that regard. 04/24/2021 upon evaluation today patient appears to be doing well with regard to her ankle region for the most part although there does appear to be some fluid trapped underneath the area where we thought this was healed last week. Unfortunately I think at this time that we can have to reopen and clean away some of this area and then subsequently depending on how things do over the next week or so we will see about discharge but right now I do not think that is the right way to go. She voiced understanding. 05/01/2021 upon evaluation today patient's wound still appears to be open unfortunately. I do believe that we do need to see what we can do about trying to get this closed I believe compression  wrapping is probably can to be indicated here. Fortunately there  is no signs of active infection at this time. 05/08/2021 upon evaluation today patient appears to be doing okay in regard to her ulcer this is very superficial and we will see any major issues here. The biggest problem currently that I find is that we just cannot get this area to completely seal up. She seemed to almost be and then things digressed a little bit. Right now is not too bad but we do need to see if we can make some difference here. I think maybe switching over to collagen may be better. Objective Constitutional Well-nourished and well-hydrated in no acute distress. Vitals Time Taken: 10:30 AM, Height: 68 in, Temperature: 98.1 F, Pulse: 55 bpm, Respiratory Rate: 18 breaths/min, Blood Pressure: 130/72 mmHg. Respiratory normal breathing without difficulty. Psychiatric this patient is able to make decisions and demonstrates good insight into disease process. Alert and Oriented x 3. pleasant and cooperative. General Notes: Upon inspection patient's wound bed showed signs of very superficial ulceration there does not appear to be any evidence of infection which is great news and overall very pleased in that regard. No fevers, chills, nausea, vomiting, or diarrhea. Integumentary (Hair, Skin) Wound #15R status is Open. Original cause of wound was Gradually Appeared. The date acquired was: 12/30/2019. The wound has been in treatment 68 weeks. The wound is located on the Left,Medial Malleolus. The wound measures 1.3cm length x 0.8cm width x 0.1cm depth; 0.817cm^2 area and 0.082cm^3 volume. There is Fat Layer (Subcutaneous Tissue) exposed. There is no tunneling or undermining noted. There is a medium amount of serous drainage noted. The wound margin is flat and intact. There is large (67-100%) red, pink granulation within the wound bed. There is no necrotic tissue within the wound bed. Wound #15R status is Open. Original cause of  wound was Gradually Appeared. The date acquired was: 12/30/2019. The wound has been in treatment 68 weeks. The wound is located on the Left,Medial Malleolus. The wound measures 1.3cm length x 0.8cm width x 0.1cm depth; 0.817cm^2 area and 0.082cm^3 volume. There is Fat Layer (Subcutaneous Tissue) exposed. There is no tunneling or undermining noted. There is a medium amount of serous drainage noted. The wound margin is flat and intact. There is large (67-100%) red, pink granulation within the wound bed. There is no necrotic tissue within the wound bed. 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 There was a Double Layer Compression Therapy Procedure by Baruch Gouty, RN. Post procedure Diagnosis Wound #15R: Same as Pre-Procedure Plan Follow-up Appointments: Return Appointment in 1 week. - with Darragh Nay Edema Control - Lymphedema / SCD / Other: Patient to wear own compression stockings every day. Exercise regularly Moisturize legs daily. Non Wound Condition: Apply the following to affected area as directed: - Apply silicone bordered foam to left lateral foot for protection until your f/u with Dr. [name] WOUND #15R: - Malleolus Wound Laterality: Left, Medial Cleanser: Wound Cleanser (Generic) 1 x Per Day/15 Days Discharge Instructions: Cleanse the wound with wound cleanser prior to applying a clean dressing using gauze sponges, not tissue or cotton balls. Prim Dressing: Promogran Prisma Matrix, 4.34 (sq in) (silver collagen) 1 x Per Day/15 Days ary Discharge Instructions: Moisten collagen with saline or hydrogel  Secondary Dressing: Woven  Gauze Sponge, Non-Sterile 4x4 in (Generic) 1 x Per Day/15 Days Discharge Instructions: Apply over primary dressing as directed. Com pression Wrap: CoFlex TLC XL 2-layer Compression System 4x7 (in/yd) 1 x Per Day/15 Days Discharge Instructions: Apply CoFlex 2-layer compression as directed. (alt for 4 layer) 1. I am good I suggest currently that we going continue with the wound care measures as before specifically with the compression wrap I think that is doing a good job. 2. I am also can recommend that we go ahead and switch over to a silver collagen dressing and see if this will be beneficial for her. 3. I am also can recommend that we have the patient continue to elevate is much as possible should help with edema control as well. We will see patient back for reevaluation in 1 week here in the clinic. If anything worsens or changes patient will contact our office for additional recommendations. Electronic Signature(s) Signed: 05/08/2021 4:50:41 PM By: [name] PA-C Entered By: [name] on 05/08/2021 16:50:40 -------------------------------------------------------------------------------- SuperBill Details Patient Name: Date of Service: [facility] [name], [name] 05/08/2021 Medical Record Number: [id number] Patient Account Number: [id number] Date of Birth/Sex: Treating RN: June 18, 1955 (65 y.o. [name] Provider: [name] Other Clinician: Referring Provider: Treating Provider/Extender: [name] in Treatment: 54 Diagnosis Coding ICD-10 Codes Code Description [phone] Chronic venous hypertension (idiopathic) with ulcer and inflammation of left lower extremity L97.828 Non-pressure chronic ulcer of other part of left lower leg with other specified severity L97.328 Non-pressure chronic ulcer of left ankle with other specified severity Facility Procedures CPT4 Code: [id number] Description: (Facility Use Only) [id number] - [facility]  COMPRS LWR LT LEG Modifier: Quantity: 1 Physician Procedures : CPT4 Code Description Modifier [id number] - WC PHYS LEVEL 3 - EST PT ICD-10 Diagnosis Description I87.332 Chronic venous hypertension (idiopathic) with ulcer and inflammation of left lower extremity L97.828 Non-pressure chronic ulcer of other part  of left lower leg with other specified severity L97.328 Non-pressure chronic ulcer of left ankle with other specified severity Quantity: 1 Electronic Signature(s) Signed: 05/08/2021 4:51:05 PM By: [name] PA-C Previous Signature: 05/08/2021 4:49:37 PM Version By: Baruch Gouty RN, BSN Entered By: [name] on 05/08/2021 16:51:05

## 2021-05-15 ENCOUNTER — Encounter (HOSPITAL_BASED_OUTPATIENT_CLINIC_OR_DEPARTMENT_OTHER): Payer: Medicare Other | Admitting: Physician Assistant

## 2021-05-15 ENCOUNTER — Other Ambulatory Visit: Payer: Self-pay

## 2021-05-15 ENCOUNTER — Other Ambulatory Visit: Payer: Medicare Other

## 2021-05-15 DIAGNOSIS — L97828 Non-pressure chronic ulcer of other part of left lower leg with other specified severity: Secondary | ICD-10-CM | POA: Diagnosis not present

## 2021-05-15 NOTE — Progress Notes (Signed)
[name], [name] ([id number]) Visit Report for 05/15/2021 Arrival Information Details Patient Name: Date of Service: [facility], [name] 05/15/2021 1:15 PM Medical Record Number: [id number] Patient Account Number: [id number] Date of Birth/Sex: Treating RN: [birth year] (65 y.o. [name] Primary Care Lakyia Behe: [name] Other Clinician: Referring Tamaka Sawin: Treating Faustine Tates/Extender: [name] in Treatment: 18 Visit Information History Since Last Visit Added or deleted any medications: No Patient Arrived: Ambulatory Any new allergies or adverse reactions: No Arrival Time: 13:31 Had a fall or experienced change in No Accompanied By: self activities of daily living that may affect Transfer Assistance: None risk of falls: Patient Identification Verified: Yes Signs or symptoms of abuse/neglect since last visito No Secondary Verification Process Completed: Yes Hospitalized since last visit: No Patient Requires Transmission-Based Precautions: No Implantable device outside of the clinic excluding No Patient Has Alerts: Yes cellular tissue based products placed in the center Patient Alerts: L ABI =1.12, TBI = .92 since last visit: R ABI= 1.02, TBI= .58 Has Dressing in Place as Prescribed: Yes Pain Present Now: No Electronic Signature(s) Signed: 05/15/2021 5:19:40 PM By: [name] Entered By: [name] on 05/15/2021 13:31:42 -------------------------------------------------------------------------------- Compression Therapy Details Patient Name: Date of Service: [facility] [name], [name] 05/15/2021 1:15 PM Medical Record Number: [id number] Patient Account Number: [id number] Date of Birth/Sex: Treating RN: [birth year] (65 y.o. [name] Primary Care Caylin Nass: [name] Other Clinician: Referring Adja Ruff: Treating Odes Lolli/Extender: [name] in Treatment: 47 Compression Therapy Performed for Wound  Assessment: Wound #15R Left,Medial Malleolus Performed By: Clinician [name], RN Compression Type: Double Layer Post Procedure Diagnosis Same as Pre-procedure Electronic Signature(s) Signed: 05/15/2021 5:19:40 PM By: [name] Entered By: [name] on 05/15/2021 13:53:56 -------------------------------------------------------------------------------- Encounter Discharge Information Details Patient Name: Date of Service: [facility] [name], [name] 05/15/2021 1:15 PM Medical Record Number: [id number] Patient Account Number: [id number] Date of Birth/Sex: Treating RN: 1955/11/29 (65 y.o. [name] Primary Care Morgin Halls: [name] Other Clinician: Referring Jakyah Bradby: Treating Jaleya Pebley/Extender: [name] in Treatment: 28 Encounter Discharge Information Items Discharge Condition: Stable Ambulatory Status: Ambulatory Discharge Destination: Home Transportation: Private Auto Schedule Follow-up Appointment: Yes Clinical Summary of Care: Provided on 05/15/2021 Form Type Recipient Paper Patient Patient Electronic Signature(s) Signed: 05/15/2021 5:19:40 PM By: [name] Entered By: [name] on 05/15/2021 13:56:21 -------------------------------------------------------------------------------- Lower Extremity Assessment Details Patient Name: Date of Service: [facility] [name], [name] 05/15/2021 1:15 PM Medical Record Number: [id number] Patient Account Number: [id number] Date of Birth/Sex: Treating RN: Jun 08, 1955 (65 y.o. [name] Primary Care Dashea Mcmullan: [name] Other Clinician: Referring Khyron Garno: Treating Necole Minassian/Extender: [name] in Treatment: 69 Edema Assessment Assessed: [Left: Yes] [Right: No] Edema: [Left: N] [Right: o] Calf Left: Right: Point of Measurement: 31 cm From Medial Instep 27 cm Ankle Left: Right: Point of Measurement: 11 cm From Medial Instep 22 cm Vascular  Assessment Pulses: Dorsalis Pedis Palpable: [Left:Yes] Electronic Signature(s) Signed: 05/15/2021 5:19:40 PM By: [name] Entered By: [name] on 05/15/2021 13:43:00 -------------------------------------------------------------------------------- Multi-Disciplinary Care Plan Details Patient Name: Date of Service: [facility] [name], [name] 05/15/2021 1:15 PM Medical Record Number: [id number] Patient Account Number: [id number] Date of Birth/Sex: Treating RN: [birth year] (65 y.o. [name] Primary Care Aulton Routt: [name] Other Clinician: Referring Elnora Quizon: Treating Destry Bezdek/Extender: [name] in Treatment: 23 [place] reviewed with physician Active Inactive Venous Leg Ulcer  Nursing Diagnoses: Actual venous Insuffiency (use after diagnosis is confirmed) Knowledge deficit related to disease process and management Goals: Patient will maintain optimal edema control Date Initiated: 06/20/2020 Target Resolution Date: 05/29/2021 Goal Status: Active Interventions: Assess peripheral edema status every visit. Compression as ordered Treatment Activities: Therapeutic compression applied : 06/20/2020 Notes: 01/30/21: Edema control ongoing, using CoFlex wrap. Wound/Skin Impairment Nursing Diagnoses: Impaired tissue integrity Knowledge deficit related to ulceration/compromised skin integrity Goals: Patient/caregiver will verbalize understanding of skin care regimen Date Initiated: 01/17/2020 Target Resolution Date: 05/29/2021 Goal Status: Active Ulcer/skin breakdown will have a volume reduction of 30% by week 4 Date Initiated: 01/17/2020 Date Inactivated: 03/12/2020 Target Resolution Date: 03/09/2020 Goal Status: Met Interventions: Assess patient/caregiver ability to obtain necessary supplies Assess patient/caregiver ability to perform ulcer/skin care regimen upon admission and as needed Assess ulceration(s) every  visit Provide education on ulcer and skin care Notes: 01/30/21: Wound care regimen continues, PuraPly skin sub being used. 04/17/21: Wound healed Electronic Signature(s) Signed: 05/15/2021 1:33:48 PM By: [name] Entered By: [name] on 05/15/2021 13:33:47 -------------------------------------------------------------------------------- Pain Assessment Details Patient Name: Date of Service: [facility] [name], [name] 05/15/2021 1:15 PM Medical Record Number: [id number] Patient Account Number: [id number] Date of Birth/Sex: Treating RN: [birth year] (65 y.o. [name] Primary Care Dorise Gangi: [name] Other Clinician: Referring Kanylah Muench: Treating Raheen Capili/Extender: [name] in Treatment: 28 Active Problems Location of Pain Severity and Description of Pain Patient Has Paino No Site Locations Pain Management and Medication Current Pain Management: Electronic Signature(s) Signed: 05/15/2021 5:19:40 PM By: [name] Entered By: [name] on 05/15/2021 13:32:18 -------------------------------------------------------------------------------- Patient/Caregiver Education Details Patient Name: Date of Service: [name], [name] 12/14/2022andnbsp1:15 PM Medical Record Number: [id number] Patient Account Number: [id number] Date of Birth/Gender: Treating RN: [birth year] (65 y.o. [name] Primary Care Physician: [name] Other Clinician: Referring Physician: Treating Physician/Extender: [name] in Treatment: 43 Education Assessment Education Provided To: Patient Education Topics Provided Venous: Methods: Explain/Verbal, Printed Responses: State content correctly Wound/Skin Impairment: Methods: Explain/Verbal, Printed Responses: State content correctly Electronic Signature(s) Signed: 05/15/2021 5:19:40 PM By: [name] Entered By: [name] on 05/15/2021  13:34:11 -------------------------------------------------------------------------------- Wound Assessment Details Patient Name: Date of Service: [facility] [name], [name] 05/15/2021 1:15 PM Medical Record Number: [id number] Patient Account Number: [id number] Date of Birth/Sex: Treating RN: June 25, 1955 (65 y.o. [name] Primary Care Mildreth Reek: [name] Other Clinician: Referring Nana Hoselton: Treating Laurell Coalson/Extender: [name] in Treatment: 64 Wound Status Wound Number: 15R Primary Venous Leg Ulcer Etiology: Wound Location: Left, Medial Malleolus Wound Status: Open Wounding Event: Gradually Appeared Comorbid Peripheral Venous Disease, Osteoarthritis, Confinement Date Acquired: 12/30/2019 History: Anxiety Weeks Of Treatment: 69 Clustered Wound: No Wound Measurements Length: (cm) 1.1 Width: (cm) 0.7 Depth: (cm) 0.1 Area: (cm) 0.605 Volume: (cm) 0.06 % Reduction in Area: 94.8% % Reduction in Volume: 99.4% Epithelialization: Small (1-33%) Tunneling: No Undermining: No Wound Description Classification: Full Thickness With Exposed Support Structures Wound Margin: Distinct, outline attached Exudate Amount: Medium Exudate Type: Serous Exudate Color: amber Foul Odor After Cleansing: No Slough/Fibrino No Wound Bed Granulation Amount: Large (67-100%) Exposed Structure Granulation Quality: Red, Pink Fascia Exposed: No Necrotic Amount: None Present (0%) Fat Layer (Subcutaneous Tissue) Exposed: Yes Tendon Exposed: No Muscle Exposed: No Joint Exposed: No Bone Exposed: No Treatment Notes Wound #15R (Malleolus) Wound Laterality: Left, Medial Cleanser Wound Cleanser Discharge Instruction: Cleanse the wound with wound cleanser prior to applying a clean  dressing using gauze sponges, not tissue or cotton balls. Peri-Wound Care Topical Primary Dressing Promogran Prisma Matrix, 4.34 (sq in) (silver collagen) Discharge Instruction: Moisten  collagen with saline or hydrogel Secondary Dressing Woven Gauze Sponge, Non-Sterile 4x4 in Discharge Instruction: Apply over primary dressing as directed. Secured With Compression Wrap CoFlex TLC XL 2-layer Compression System 4x7 (in/yd) Discharge Instruction: Apply CoFlex 2-layer compression as directed. (alt for 4 layer) Compression Stockings Add-Ons Electronic Signature(s) Signed: 05/15/2021 5:19:40 PM By: [name] Entered By: [name] on 05/15/2021 13:42:36 -------------------------------------------------------------------------------- Vitals Details Patient Name: Date of Service: [name], [name] 05/15/2021 1:15 PM Medical Record Number: [id number] Patient Account Number: [id number] Date of Birth/Sex: Treating RN: 02/01/1956 (65 y.o. [name] Primary Care Willys Salvino: [name] Other Clinician: Referring Isiaah Cuervo: Treating Irish Breisch/Extender: [name] in Treatment: 48 Vital Signs Time Taken: 13:31 Temperature (F): 98.2 Height (in): 68 Pulse (bpm): 64 Respiratory Rate (breaths/min): 18 Blood Pressure (mmHg): 114/55 Reference Range: 80 - 120 mg / dl Electronic Signature(s) Signed: 05/15/2021 5:19:40 PM By: [name] Entered By: [name] on 05/15/2021 13:32:04

## 2021-05-15 NOTE — Progress Notes (Addendum)
ABCDE, ONEIL ([id number]) Visit Report for 05/15/2021 Chief Complaint Document Details Patient Name: Date of Service: [facility] [name], [name] 05/15/2021 1:15 PM Medical Record Number: [id number] Patient Account Number: [id number] Date of Birth/Sex: Treating RN: 12-27-1955 (65 y.o. [name] Provider: [name] Other Clinician: Referring Provider: Treating Provider/Extender: [name] in Treatment: 58 Information Obtained from: Patient Chief Complaint patient is been followed long-term in this clinic for venous insufficiency ulcers with inflammation, hypertension and ulceration over the medial ankle bilaterally. 01/17/2020; this is a patient who is here for review of postoperative wounds on the left lateral ankle and recurrence of venous stasis ulceration on the left medial Electronic Signature(s) Signed: 05/15/2021 1:21:31 PM By: [name] PA-C Entered By: [name] on 05/15/2021 13:21:31 -------------------------------------------------------------------------------- HPI Details Patient Name: Date of Service: [facility] [name], [name] 05/15/2021 1:15 PM Medical Record Number: [id number] Patient Account Number: [id number] Date of Birth/Sex: Treating RN: Oct 31, 1955 (65 y.o. [name] Provider: [name] Other Clinician: Referring Provider: Treating Provider/Extender: [name] in Treatment: 63 History of Present Illness HPI Description: the remaining wound is over the left medial ankle. Similar wound over the right medial ankle healed largely with use of Apligraf. Most recently we have been using Hydrofera Blue over this wound with considerable improvement. The patient has been extensively worked up in the past for her venous insufficiency and she is not a candidate for antireflux surgery although I have none of the details available currently. 08/24/14; considerable  improvement today. About 50% of this wound areas now epithelialized. The base of the wound appears to be healthier granulation.as opposed to last week when she had deteriorated a considerable improvement 08/17/14; unfortunately the wound has regressed somewhat. The areas of epithelialization from the superior aspect are not nearly as healthy as they were last week. The patient thinks her Hydrofera Blue slipped. 09/07/14; unfortunately the area has markedly regressed in the 2 weeks since I've seen this. There is an odor surrounding erythema. The healthy granulation tissue that we had at the base of the wound now is a dusky color. The nurse reports green drainage 09/14/14; the area looks somewhat better than last week. There is less erythema and less drainage. The culture I did did not show any growth. Nevertheless I think it is better to continue the Cipro and doxycycline for a further week. The remaining wound area was debridement. 09/21/14. Wound did not require debridement last week. Still less erythema and less drainage. She can complete her antibiotics. The areas of epithelialization in the superior aspect of the wound do not look as healthy as they did some weeks ago 10/05/14 continued improvement in the condition of this wound. There is advancing epithelialization. Less aggressive debridement required 10/19/14 continued improvement in the condition and volume of this wound. Less aggressive debridement to the inferior part of this to remove surface slough and fibrinous eschar 11/02/14 no debridement is required. The surface granulation appears healthy although some of her islands of epithelialization seem to have regressed. No evidence of infection 11/16/14; lites surface debridement done of surface eschar. The wound does not look to be unhealthy. No evidence of infection. Unfortunately the patient has had podiatry issues in the right foot and for some reason has redeveloped small surface ulcerations in  the medial right ankle. Her original presentation involved wounds in this area 11/23/14 no debridement. The area on the right ankle  has enlarged. The left ankle wound appears stable in terms of the surface although there is periwound inflammation. There has been regression in the amount of new skin 11/30/14 no debridement. Both wound areas appear healthy. There was no evidence of infection. The the new area on the right medial ankle has enlarged although that both the surfaces appear to be stable. 12/07/14; Debridement of the right medial ankle wound. No no debridement was done on the left. 12/14/14 no major change in and now bilateral medial ankle wounds. Both of these are very painful but the no overt evidence of infection. She has had previous venous ablation 12/21/14; patient states that her right medial ankle wound is considerably more painful last week than usual. Her left is also somewhat painful. She could not tolerate debridement. The right medial ankle wound has fibrinous surface eschar 12/28/14 this is a patient with severe bilateral venous insufficiency ulcers. For a considerable period of time we actually had the one on the right medial ankle healed however this recently opened up again in June. The left medial ankle wound has been a refractory area with some absent flows. We had some success with Hydrofera Blue on this area and it literally closed by 50% however it is recently opened up Foley. Both of these were debridement today of surface eschar. She tolerates this poorly 01/25/15: No change in the status of this. Thick adherent escar. Very poor tolerance of any attempt at debridement. I had healed the right medial malleolus wound for a considerable amount of time and had the left one down to about 50% of the volume although this is totally regressed over the last 48 weeks. Further the right leg has reopened. she is trying to make a appointment with pain and vascular, previous ablations with  Dr. Aleda Grana. I do not believe there is an arterial insufficiency issue here 02/01/15 the status of the adherent eschar bilaterally is actually improved. No debridement was done. She did not manage to get vascular studies done 02/08/15 continued debridement of the area was done today. The slough is less adherent and comes off with less pressure. There is no surrounding infection peripheral pulses are intact 02/15/15 selective debridement with a disposable curette. Again the slough is less adherent and comes off with less difficulty. No surrounding infection peripheral pulses are intact. 02/22/15 selective debridement of the right medial ankle wound. Slough comes off with less difficulty. No obvious surrounding infection peripheral pulses are intact I did not debridement the one on the left. Both of these are stable to improved 03/01/15 selective debridement of both wound areas using a curette to. Adherent slough cup soft with less difficulty. No obvious surrounding infection. The patient tells me that 2 days ago she noted a rash above the right leg wrap. She did not have this on her lower legs when she change this over she arrives with widespread left greater than right almost folliculitis-looking rash which is extremely pruritic. I don't see anything to culture here. There is no rash on the rest of her body. She feels well systemically. 03/08/15; selective debridement of both wounds using a curette. Base of this does not look unhealthy. She had limegreen drainage coming out of the left leg wound and describes a lot of drainage. The rash on her left leg looks improved to. No cultures were done. 03/22/15; patient was not here last week. Basal wounds does not look healthy and there is no surrounding erythema. No drainage. There is still a rash on  the left leg that almost looks vasculitic however it is clearly limited to the top of where the wrap would be. 04/05/15; on the right required a surgical  debridement of surface eschar and necrotic subcutaneous tissue. I did not debridement the area on the left. These continue to be large open wounds that are not changing that much. We were successful at one point in healing the area on the right, and at the same time the area on the left was roughly half the size of current measurements. I think a lot of the deterioration has to do with the prolonged time the patient is on her feet at work 04/19/15 I attempted-like surface debridement bilaterally she does not tolerate this. She tells me that she was in allergic care yesterday with extreme pain over her left lateral malleolus/ankle and was told that she has an "sprain" 05/03/15; large bilateral venous insufficiency wounds over the medial malleolus/medial aspect of her ankles. She complains of copious amounts of drainage and his usual large amounts of pain. There is some increasing erythema around the wound on the right extending into the medial aspect of her foot to. historically she came in with these wounds the right one healed and the left one came down to roughly half its current size however the right one is reopened and the left is expanded. 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 05/10/15 large bilateral venous insufficiency wounds. There is less adherence surface left however the surface culture that I did last week grew pseudomonas therefore bilateral selective debridement score necessary. There is surrounding erythema. The patient describes severe bilateral drainage and a lot of pain in the left ankle. Apparently her podiatrist was were ready to do a cortisone shot 05/17/15; the patient complains of pain and again copious amounts of drainage. 05/24/15; we used Iodo flex last week. Patient notes considerable improvement in wound drainage. Only needed to change this once. 05/31/15; we continued Iodoflex; the base of these large wounds  bilaterally is not too bad but there is probably likely a significant bioburden here. I would like to debridement just doesn't tolerate it. 06/06/14 I would like to continue the Iodoflex although she still hasn't managed to obtain supplies. She has bilateral medial malleoli or large wounds which are mostly superficial. Both of them are covered circumferentially with some nonviable fibrinous slough although she tolerates debridement very poorly. She apparently has an appointment for an ablation on the right leg by interventional radiology. 06/14/15; the patient arrives with the wounds and static condition. We attempted a debridement although she does not do well with this secondary to pain. I 07/05/15; wounds are not much smaller however there appears to be a cleaner granulating base. The left has tight fibrinous slough greater than the right. Debridement is tolerated poorly due to pain. Iodoflex is done more for these wounds in any of the multitude of different dressings I have tried on the left 1 and then subsequently the right. 07/12/15; no change in the condition of this wound. I am able to do an aggressive debridement on the right but not the left. She simply cannot tolerate it. We have been using Iodoflex which helps somewhat. It is worthwhile remembering that at one point we healed the right medial ankle wound and the left was about 25% of the current circumference. We have suggested returning to vascular surgery for review of possible further ablations for one reason  or another she has not been able to do this. 07/26/15 no major change in the condition of either wound on her medial ankle. I did not attempt to debridement of these. She has been aggressively scrubbing these while she is in the shower at home. She has her supply of Iodoflex which seems to have done more for these wounds then anything I have put on recently. 08/09/15 wound area appears larger although not verified by measurements. Using  Iodoflex 09/05/2015 -- she was here for avisit today but had significant problems with the wound and I was asked to see her for a physician opinion. I have summarize that this lady has had surgery on her left lower extremity about 10 years ago where the possible veins stripping was done. She has had an opinion from interventional radiology around November 2016 where no further sclerotherapy was ordered. The patient works 12 hours a day and stands on a concrete floor with work boots and is unable to get the proper compression she requires and cannot elevate her limbs appropriately at any given time. She has recently grown Pseudomonas from her wound culture but has not started her ciprofloxacin which was called in for her. 09/13/15 this continues to be a difficult situation for this patient. At one point I had this wound down to a 1.5 x 1.5" wound on her left leg. This is deteriorated and the right leg has reopened. She now has substantial wounds on her medial calcaneus, malleoli and into her lower leg. One on the left has surface eschar but these are far too painful for me to debridement here. She has a vascular surgery appointment next week to see if anything can be done to help here. I think she has had previous ablations several years ago at [facility]. She has no major edema. She tells me that she did not get product last time [facility] and went for several days without it. She continues to work in work boots 12 hours a day. She cannot get compression/4-layer under her work boots. 09/20/15 no major change. Periwound edema control was not very good. Her point with pain and vascular is next Wednesday the 25th 09/28/15; the patient is seen vascular surgery and is apparently scheduled for repeat duplex ultrasounds of her bilateral lower legs next week. 10/05/15; the patient was seen by Dr. Doren Custard of vascular surgery. He feels that she should have arterial insufficiency excluded as cause/contributed to  her nonhealing stage she is therefore booked for an arteriogram. She has apparently monophasic signals in the dorsalis pedis pulses. She also of course has known severe chronic venous insufficiency with previous procedures as noted previously. I had another long discussion with the patient today about her continuing to work 12 hour shifts. I've written her out for 2 months area had concerns about this as her work location is currently undergoing significant turmoil and this may lead to her termination. She is aware of this however I agree with her that she simply cannot continue to stand for 12 hours multiple days a week with the substantial wound areas she has. 10/19/15; the Dr. Doren Custard appointment was largely for an arteriogram which was normal. She does not have an arterial issue. He didn't make a comment about her chronic venous insufficiency for which she has had previous ablations. Presumably it was not felt that anything additional could be done. The patient is now out of work as I prescribed 2 weeks ago. Her wounds look somewhat less aggravated presumably  because of this. I felt I would give debridement another try today 10/25/15; no major change in this patient's wounds. We are struggling to get her product that she can afford into her own home through her insurance. 11/01/15; no major change in the patient's wounds. I have been using silver alginate as the most affordable product. I spoke to Dr. Marla Roe last week with her requested take her to the OR for surgical debridement and placement of ACEL. Dr. Marla Roe told me that she would be willing to do this however [facility] will not cover this, fortunately the patient has [place] healthcare of some variant 11/08/15; no major change in the patient's wounds. She has been completely nonviable surface that this but is in too much pain with any attempted debridement are clinic. I have arranged for her to see Dr. Marla Roe ham of plastic  surgery and this appointment is on Monday. I am hopeful that they will take her to the OR for debridement, possible ACEL ultimately possible skin graft 11/22/15 no major change in the patient's wounds over her bilateral medial calcaneus medial malleolus into the lower legs. Surface on these does not look too bad however on the left there is surrounding erythema and tenderness. This may be cellulitis or could him sleepy tinea. 11/29/15; no major changes in the patient's wounds over her bilateral medial malleolus. There is no infection here and I don't think any additional antibiotics are necessary. There is now plan to move forward. She sees Dr. Marla Roe in a week's time for preparation for operative debridement and ACEL placement I believe on 7/12. She then has a follow-up appointment with Dr. Marla Roe on 7/21 12/28/15; the patient returns today having been taken to the [facility] by Dr. Marla Roe 12/12/15 she underwent debridement, intraoperative cultures [which were negative]. She had placement of a wound VAC. Parent really ACEL was not available to be placed. The wound VAC foam apparently adhered to the wound since then she's been using silver alginate, Xeroform under Ace wraps. She still says there is a lot of drainage and a lot of pain 01/31/16; this is a patient I see monthly. I had referred her to Dr. Marla Roe him of plastic surgery for large wounds on her bilateral medial ankles. She has been to the OR twice once in early July and once in early August. She tells me over the last 3 weeks she has been using the wound VAC with ACEL underneath it. On the right we've simply been using silver alginate. Under Kerlix Coban wraps. 02/28/16; this is a patient I'm currently seeing monthly. She is gone on to have a skin graft over her large venous insufficiency ulcer on the left medial ankle. This was done by Dr. Marla Roe him. The patient is a bit perturbed about why she didn't have one on her right medial  ankle wound. She has been using silver alginate to this. 03/06/16; I received a phone call from her plastic surgery Dr. Marla Roe. She expressed some concern about the viability of the skin graft she did on the left medial ankle wound. Asked me to place Endoform on this. She told me she is not planning to do a subsequent skin graft on the right as the left one did not take very well. I had placed Hydrofera Blue on the right 03/13/16; continue to have a reasonably healthy wound on the right medial ankle. Down to 3 mm in terms of size. There is epithelialization here. The area on the left medial  ankle is her skin graft site. I suppose the last week this looks somewhat better. She has an open area inferiorly however in the center there appears to be some viable tissue. There is a lot of surface callus and eschar that will eventually need to come off however none of this looked to be infected. Patient states that the is able to keep the dressing on for several days which is an improvement. 03/20/16 no major change in the circumference of either wound however on the left side the patient was at Dr. Eusebio Friendly office and they did a debridement of left wound. 50% of the wound seems to be epithelialized. I been using Endoform on the left Hydrofera Blue in the right 03/27/16; she arrives today with her wound is not looking as healthy as they did last week. The area on the right clearly has an adherent surface to this a very similar surface on the left. Unfortunately for this patient this is all too familiar problem. Clearly the Endoform is not working and will need to change that today that has some potential to help this surface. She does not tolerate debridement in this clinic very well. She is changing the dressing wants 04/03/16; patient arrives with the wounds looking somewhat better especially on the right. Dr. Migdalia Dk change the dressing to silver alginate when she saw her on Monday and also sold her  some compression socks. The usefulness of the latter is really not clear and woman with severely draining wounds. 04/10/16; the patient is doing a bit of an experiment wearing the compression stockings that Dr. Migdalia Dk provided her to her left leg and the out of legs based dressings that we provided to the right. 05/01/16; the patient is continuing to wear compression stockings Dr. Migdalia Dk provided her on the left that are apparently silver impregnated. She has been using Iodoflex to the right leg wound. Still a moderate amount of drainage, when she leaves here the wraps only last for 4 days. She has to change the stocking on the left leg every night 05/15/16; she is now using compression stockings bilaterally provided by Dr. Marla Roe. She is wearing a nonadherent layer over the wounds so really I don't think there is anything specific being done to this now. She has some reduction on the left wound. The right is stable. I think all healing here is being done without a specific dressing 06/09/16; patient arrives here today with not much change in the wound certainly in diameter to large circular wounds over the medial aspect of her ankle bilaterally. Under the light of these services are certainly not viable for healing. There is no evidence of surrounding infection. She is wearing compression stockings with some sort of silver impregnation as prescribed by Dr. Marla Roe. She has a follow-up with her tomorrow. 06/30/16; no major change in the size or condition of her wounds. These are still probably covered with a nonviable surface. She is using only her purchase stockings. She did see Dr. Marla Roe who seemed to want to apply Dakin's solution to this I'm not extreme short what value this would be. I would suggest Iodoflex which she still has at home. 07/28/16; I follow Mrs. Perfetti episodically along with Dr. Marla Roe. She has very refractory venous insufficiency wounds on her bilateral medial legs  left greater than right. She has been applying a topical collagen ointment to both wounds with Adaptic. I don't think Dr. Marla Roe is planning to take her back to the OR. 08/19/16; I follow  Mrs. Guymon on a monthly basis along with Dr. Marla Roe of plastic surgery. 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 I been following her for a number of years. At one point I was able to get the right medial malleolus wound to heal and had the left medial malleolus down to about half its current size however and I had to send her to plastic surgery for an operative debridement. Since then things have been stable to slightly improve the area on the right is slightly better one in the left about the same although there is much less adherent surface than I'm used to with this patient. She is using some form of liquid collagen gel that Dr. Marla Roe provided a Kerlix cover with the patient's own pressure stockings. She tells me that she has extreme pain in both ankles and along the lateral aspect of both feet. She has been unable to work for some period of time. She is telling me she is retiring at the beginning of April. She sees Dr. Doran Durand of orthopedics next week 09/22/16; patient has not seen Dr. Marla Roe since the last time she is here. I'm not really sure what she is using to the wounds other than bits and pieces of think she had left over including most recently Hydrofera Blue. She is using juxtalite stockings. She is having difficulty with her husband's recent illness "stroke". She is having to transport him to various doctors appointments. Dr. Marla Roe left her the option of a repeat debridement with ACEL however she has not been able to get the time to follow-up on this. She continues to have a fair amount of drainage out of these wounds with certainly precludes leaving dressings on all week 10/13/16; patient has not seen Dr.  Marla Roe since she was last in our clinic. I'm not really sure what she is doing with the wounds, we did try to get her [facility] and I think she is actually using this most of the time. Because of drainage she states she has to change this every second day although this is an improvement from what she used to do. She went to see Dr. Doran Durand who did not think she had a muscular issue with regards to her feet, he referred her to a neurologist and I think the appointment is sometime in June. I changed her back to Iodoflex which she has used in the past but not recently. 11/03/16; the patient has been using Iodoflex although she ran out of this. Still claims that there is a lot of drainage although the wound does not look like this. No surrounding erythema. She has not been back to see Dr. Marla Roe 11/24/16; the patient has been using Iodoflex again but she ran out of it 2 or 3 days ago. There is no major change in the condition of either one of these wounds in fact they are larger and covered in a thick adherent surface slough/nonviable tissue especially on the left. She does not tolerate mechanical debridement in our clinic. Going back to see Dr. Marla Roe of plastic surgery for an operative debridement would seem reasonable. 12/15/16; the patient has not been back to see Dr. Marla Roe. She is been dealing with a series of illnesses and her husband which of monopolized her time. She is been using Sorbact which we largely supplied. She states the drainage is bad enough that it maximum she can go 2-3 days without changing the dressing 01/12/2017 --  the patient has not been back for about 4 weeks and has not seen Dr. Marla Roe not does she have any appointment pending. 01/23/17; patient has not seen Dr. Marla Roe even though I suggested this previously. She is using Santyl that was suggested last week by Dr. Con Memos this Cost her $16 through her insurance which is indeed surprising 02/12/17;  continuing Santyl and the patient is changing this daily. A lot of drainage. She has not been back to see plastic surgery she is using an Ace wrap. Our intake nurse suggested wrap around stockings which would make a good reasonable alternative 02/26/17; patient is been using Santyl and changing this daily due to drainage. She has not been to see plastic surgery she uses in April Ace wrap to control the edema. She did obtain extremitease stockings but stated that the edema in her leg was to big for these 03/20/17; patient is using Santyl and Anasept. Surfaces looked better today the area on the right is actually measuring a little smaller. She has states she has a lot of pain in her feet and ankles and is asking for a consult to pain control which I'll try to help her with through our case manager. 04/10/17; the patient arrives with better-looking wound surfaces and is slightly smaller wound on the left she is using a combination of Santyl and Anasept. She has an appointment or at least as started in the pain control center associated with [facility] 05/14/17; this is a patient who I followed for a prolonged period of time. She has venous insufficiency ulcers on her bilateral medial ankles. At one point I had this down to a much smaller wound on the left however these reopened and we've never been able to get these to heal. She has been using Santyl and Anasept gel although 2 weeks ago she ran out of the Anasept gel. She has a stable appearance of the wound. She is going to the wound care clinic at [facility]. They wanted do a nerve block/spinal block although she tells me she is reluctant to go forward with that. 05/21/17; this is a patient I have followed for many years. She has venous insufficiency ulcers on her bilateral medial ankles. Chronic pain and deformity in her ankles as well. She is been to see plastic surgery as well as orthopedics. Using PolyMem AG most recently/Kerramax/ABDs  and 2 layer compression. She has managed to keep this on and she is coming in for a nurse check to change the dressing on Tuesdays, we see her on Fridays 06/05/17; really quite a good looking surface and the area especially on the right medial has contracted in terms of dimensions. Well granulated healthy-looking tissue on both sides. Even with an open curet there is nothing that even feels abnormal here. This is as good as I've seen this in quite some time. We have been using PolyMem AG and bringing her in for a nurse check 06/12/17; really quite good surface on both of these wounds. The right medial has contracted a bit left is not. We've been using PolyMem and AG and she is coming in for a nurse visit 06/19/17; we have been using PolyMem AG and bringing her in for a nurse check. Dimensions of her wounds are not better but the surfaces looked better bilaterally. She complained of bleeding last night and the left wound and increasing pain bilaterally. She states her wound pain is more neuropathic than just the wounds. There was some suggestion that this  was radicular from her pain management doctor in talking to her it is really difficult to sort this out. 06/26/17; using PolyMem and AG and bringing her in for a nurse check as All of this and reasonably stable condition. Certainly not improved. The dimensions on the lateral part of the right leg look better but not really measuring better. The medial aspect on the left is about the same. 07/03/16; we have been using PolyMen AG and bringing her in for a nurse check to change the dressings as the wounds have drainage which precludes once weekly changing. We are using all secondary absorptive dressings.our intake nurse is brought up the idea of using a wound VAC/snap VAC on the wound to help with the drainage to see if this would result in some contraction. This is not a bad idea. The area on the right medial is actually looking smaller. Both wounds have a  reasonable-looking surface. There is no evidence of cellulitis. The edema is well controlled 07/10/17; the patient was denied for a snap VAC by her insurance. The major issue with these wounds continues to be drainage. We are using wicked PolyMem AG and she is coming in for a nurse visit to change this. The wounds are stable to slightly improved. The surface looks vibrant and the area on the right certainly has shrunk in size but very slowly 07/17/17; the patient still has large wounds on her bilateral medial malleoli. Surface of both of these wounds looks better. The dimensions seem to come and go but no consistent improvement. There is no epithelialization. We do not have options for advanced treatment products due to insurance issues. They did not approve of the wound VAC to help control the drainage. More recently we've been using PolyMem and AG wicked to allow drainage through. We have been bringing her in for a nurse visit to change this. We do not have a lot of options for wound care products and the home again due to insurance issues 07/24/17; the patient's wound actually looks somewhat better today. No drainage measurements are smaller still healthy-looking surface. We used silver collagen under PolyMen started last week. We have been bringing her in for a dressing change 07/31/17; patient's wound surface continued to look better and I think there is visible change in the dimensions of the wound on the right. Rims of epithelialization. We have been using silver collagen under PolyMen and bringing her in for a dressing change. There appears to be less drainage although she is still in need of the dressing change 08/07/17. Patient's wound surface continues to look better on both sides and the area on the right is definitely smaller. We have been using silver collagen and PolyMen. She feels that the drainage has been it has been better. I asked her about her vascular status. She went to see Dr.  Aleda Grana at [facility] and had some form of ablation. I don't have much detail on this. I haven't my notes from 2016 that she was not a candidate for any further ablation but I don't have any more information on this. We had referred her to vein and vascular I don't think she ever went. He does not have a history of PAD although I don't have any information on this either. We don't even have ABIs in our record 08/14/17; we've been using silver collagen and PolyMen cover. And putting the patient and compression. She we are bringing her in as a nurse visit to change this because ofarge  amount of drainage. We didn't the ABIs in clinic today since they had been done in many moons 1.2 bilaterally. She has been to see vein and vascular however this was at [facility] and she had ablation although I really don't have any information on this all seemed biking get a report. She is also been operatively debrided by plastic surgery and had a cell placed probably 8-12 months ago. This didn't have a major effect. We've been making some gains with current dressings 08/19/17-She is here in follow-up evaluation for bilateral medial malleoli ulcers. She continues to tolerate debridement very poorly. We will continue with recently changed topical treatment; if no significant improvement may consider switching to Iodosorb/Iodoflex. 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 She has been using silver collagen and PolyMem. I believe she has been used and tried on Iodoflex before. 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 These have been very refractory over time. We have been using silver collagen and PolyMen. She is coming in once a week for a doctor's and once a week for  nurse visits. We are actually making some progress 09/18/17; the patient's wounds are smaller especially on the right medial. She arrives today to upset to consider even washing these off with Anasept which I think is been part of the reason this is been closing. We've been using collagen covered in PolyMen otherwise. It is noted that she has a small area of folliculitis on the right medial calf that. As we are wrapping her legs I'll give her a short course of doxycycline to make sure this doesn't amount to anything. She is a long list of complaints today including imbalance, shortness of breath on exertion, inversion of her left ankle. With regards to the latter complaints she is been to see orthopedics and they offered her a tendon release surgery I believe but wanted her wounds to be closed first. I have recommended she go see her primary physician with regards to everything else. 09/25/17; patient's wounds are about the same size. We have made some progress bilaterally although not in recent weeks. She will not allow me T wash these wounds with Anasept even if she is doing her cell. Wheeze we've been using collagen covered in PolyMen. Last week she had a small area of folliculitis this is now opened into a small wound. She completed 5 days of trimethoprim sulfamethoxazole 10/02/17; unfortunately the area on her left medial ankle is worse with a larger wound area towards the Achilles. The patient complains of a lot of pain. She will not allow debridement although visually I don't think there is anything to debridement in any case. We have been using silver collagen and PolyMen for several months now. Initially we are making some progress although I'm not really seeing that today. We will move back to [facility]. His admittedly this is a bit of a repeat however I'm hoping that his situation is different now. The patient tells me she had her leg on the left give out on her yesterday this is process  some pain. 10/09/17; the patient is seen twice a week largely because of drainage issues coming out of the chronic medial bimalleolar wounds that are chronic. Last week the dimensions of the one on the left looks a  little larger I changed her to [facility] She comes in today with a history of terrible pain in the bilateral wound areas. She will not allow debridement. She will not even allow a tissue culture. There is no surrounding erythema no no evidence of cellulitis. We have been putting her Kerlix Coban man. She will not allow more aggressive compression as there was a suggestion to put her in 3 layer wraps. 10/16/17; large wounds on her bilateral medial malleoli. These are chronic. Not much change from last week. The surface looks have healthy but absolutely no epithelialization. A lot of pain little less so of drainage. She will not allow debridement or even washing these off in the vigorous fashion with Anasept. 10/23/17; large wounds on her bilateral malleoli which are chronic. Some improvement in terms of size perhaps on the right since last time I saw these. She states that after we increased the 3 layer compression there was some bleeding, when she came in for a nurse visit she did not want 3 layer compression put back on about our nurse managed to convince her. She has known chronic venous visit issues and I'm hoping to get her to tolerate the 3 layer compression. using Hydrofera Blue 10/30/17; absolutely no change in the condition of either wound although we've had some improvement in dimensions on the right.. Attempted to put her in 3 layer compression she didn't tolerated she is back in 2 layer compression. We've been using Hydrofera Blue We looked over her past records. She had venous reflux studies in November 2016. There was no evidence of deep venous reflux on the right. Superficial vein did not show the greater saphenous vein at think this is been previously ablated the small  saphenous vein was within normal limits. The left deep venous system showed no DVT the vessels were positive for deep venous reflux in the posterior tibial veins at the ankle. The greater saphenous vein was surgically absent small saphenous vein was within normal limits. She went to vein and vascular at [facility]. I believe she had an ablation on the left greater saphenous vein. I'll update her reflux studies perhaps ever reviewed by vein and vascular. We've made absolutely no progress in these wounds. Will also try to read and TheraSkins through her insurance 11/06/17; W the patient apparently has a 2 week follow-up with vein and vascular I like him to review the whole issue with regards to her previous vascular workup by Dr. Aleda Grana. We've really made no progress on these wounds in many months. She arrives today with less viable looking surface on the left medial ankle wound. This was apparently looking about the same on Tuesday when she was here for nurse visit. 11/13/17; deep tissue culture I did last time of the left lower leg showed multiple organisms without any predominating. In particular no Staphylococcus or group A strep were isolated. We sent her for venous reflux studies. She's had a previous left greater saphenous vein stripping and I think sclerotherapy of the right greater saphenous vein. She didn't really look at the lesser saphenous vein this both wounds are on the medial aspect. She has reflux in the common femoral vein and popliteal vein and an accessory vein on the right and the common femoral vein and popliteal vein on the left. I'm going to have her go to see vein and vascular just the look over things and see if anything else beside aggressive compression is indicated here. We have not been able to  make any progress on these wounds in spite of the fact that the surface of the wounds is never look too bad. 11/20/17; no major change in the condition of the wounds. Patient  reports a large amount of drainage. She has a lot of complaints of pain although enlisting her today I wonder if some of this at least his neuropathic rather than secondary to her wounds. She has an appointment with vein and vascular on 12/30/17. The refractory nature of these wounds in my mind at least need vein and vascular to look over the wounds the recent reflux studies we did and her history to see if anything further can be done here. I also note her gait is deteriorated quite a bit. 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 She has a bilateral Trendelenburg gait. I wonder if this is neuropathic or perhaps multilevel radicular. 11/27/17; her wounds actually looks slightly better. Healthy-looking granulation tissue a scant amount of epithelialization. [place] healthcare will not pay for [employer]. They will play for tri layer Oasis and Dermagraft. This is not a diabetic ulcer. We'll try for the tri layer Oasis. She still complains of some drainage. She has a vein and vascular appointment on 12/30/17 12/04/17; the wounds visually look quite good. Healthy-looking granulation with some degree of epithelialization. We are still waiting for response to our request for trial to try layer Oasis. Her appointment with vascular to review venous and arterial issues isn't sold the end of July 7/31. Not allow debridement or even vigorous cleansing of the wound surface. 12/18/17; slightly smaller especially on the right. 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 We've been using Hydrofera Blue. We still are looking into triple layer Oasis through her insurance 01/08/18 on evaluation today patient's wound actually appears to be showing signs of good improvement at this point in time. She has been tolerating the dressing changes without complication. Fortunately there does not appear to be any evidence of infection at this point in time. We have been utilizing silver nitrate  which does seem to be of benefit for her which is also good news. Overall I'm very happy with how things seem to be both regards appearance as well as measurement. Patient did see Dr. Bridgett Larsson for evaluation on 12/30/17. In his assessment he felt that stripping would not likely add much more than chronic compression to the patient's healing process. His recommendation was to follow-up in three months with Dr. Doren Custard if she hasn't healed in order to consider referral back to you and see vascular where she previously was in a trial and was able to get her wound to heal. I'll be see what she feels she when you staying compression and he reiterated this as well. 01/13/18 on evaluation today patient appears to actually be doing very well in regard to her bilateral medial malleolus ulcers. She seems to have tolerated the chemical cauterization with silver nitrate last week she did have some pain through that evening but fortunately states that I'll be see since it seems to be doing better she is overall pleased with the progress. 01/21/18; really quite a remarkable improvement since I've last seen these wounds. We started using silver nitrate specially on the islands of hyper granulation which for some reason her around the wound circumference. This is really done quite nicely. Primary dressing Hydrofera Blue under 4 layer compression. She seems to be able to hold out without a nurse rewrap. Follow-up in 1 week 01/28/18; we've continued  the hydrofera blue but continued with chemical cauterization to the wound area that we started about a month ago for irregular hyper granulation. She is made almost stunning improvement in the overall wound dimensions. I was not really expecting this degree of improvement in these chronic wounds 02/05/18; we continue with Hydrofera Bluebut of also continued the aggressive chemical cauterization with silver nitrate. We made nice progress with the right greater than left  wound. 02/12/18. We continued with Hydrofera Blue after aggressive chemical cauterization with silver nitrate. We appear to be making nice progress with both wound areas 02/19/2018; we continue with [facility] after washing the wounds vigorously with Anasept spray and chemical cauterization with silver nitrate. We are making excellent progress. The area on the right's just about closed 02/26/2018. The area on the left medial ankle had too much necrotic debris today. I used a #5 curette we are able to get most of the soft. I continued with the silver nitrate to the much smaller wound on the right medial ankle she had a new area on her right lower pretibial area which she says was due to a role in her compression 03/05/2018; both wound areas look healthy. Not much change in dimensions from last week. I continue to use silver nitrate and Hydrofera Blue. The patient saw Dr. Doren Custard of vein and vascular. He felt she had venous stasis ulcers. He felt based on her previous arteriogram she should have adequate circulation for healing. Also she has deep venous reflux but really no significant correctable superficial venous reflux at this time. He felt we should continue with conservative management including leg elevation and compression 04/02/2018; since we last saw this woman about a month ago she had a fall apparently suffered a pelvic fracture. I did not look up the x-ray. Nevertheless because of pain she literally was bedbound for 2 weeks and had home health coming out to change the dressing. Somewhat predictably this is resulted in considerable improvement in both wound areas. The right is just about closed on the medial malleolus and the left is about half the size. 04/16/2018; both her wounds continue to go down in size. Using Hydrofera Blue. 05/07/18; both her wounds appeared to be improving especially on the right where it is almost closed. We are using Hydrofera Blue 05/14/2018; slightly worse this  week with larger wounds. Surface on the left medial not quite as good. We have been using Hydrofera Blue 05/21/18; again the wounds are slightly larger. Left medial malleolus slightly larger with eschar around the circumference. We have been using Hydrofera Blue undergoing a wraps for a prolonged period of time. This got a lot better when she was more recumbent due to a fall and a back injury. I change the primary dressing the silver alginate today. She did not tolerate a 4 layer compression previously although I may need to bring this up with her next time 05/28/2018; area on the left medial malleolus again is slightly larger with more drainage. Area on the right is roughly unchanged. She has a small area of folliculitis on the right medial just on the lower calf. This does not look ominous. 06/03/2018 left medial malleolus slightly smaller in a better looking surface. We used silver nitrate on this last time with silver alginate. The area on the right appears slightly smaller 1/10; left medial malleolus slightly smaller. Small open area on the right. We used silver nitrate and silver alginate as of 2 weeks ago. We continue with the wound and  compression. These got a lot better when she was off her feet 1/17; right medial malleolus wound is smaller. The left may be slightly smaller. Both surfaces look somewhat better. 1/24; both wounds are slightly smaller. Using silver alginate under Unna boots 1/31; both wounds appear smaller in fact the area on the right medial is just about closed. Surface eschar. We have been using silver alginate under Unna boots. The patient is less active now spends let much less time on her feet and I think this is contributed to the general improvement in the wound condition 2/7; both wounds appear smaller. I was hopeful the right medial would be closed however there there is still the same small open area. Slight amount of surface eschar on the left the dimensions are  smaller there is eschar but the wound edges appear to be free. We have been using silver alginate under Unna boot's 2/14; both wounds once again measure smaller. Circumferential eschar on the left medial. We have been using silver alginate under Unna boots with gradual improvement 2/21; the area on the right medial malleolus has healed. The area on the left is smaller. We have been using silver alginate and Unna boots. We can discharge wrapping the right leg she has 20/30 stockings at home she will need to protect the scar tissue in this area 2/28; the area on the right medial malleolus remains closed the patient has a compression stocking. The area on the left is smaller. We have been using silver alginate and Unna boots. 3/6 the area on the right medial ankle remains closed. Good edema control noted she is using her own compression stocking. The area on the left medial ankle is smaller. We have been managing this with silver alginate and Unna boots which we will continue today. 3/13; the area on the right medial ankle remains closed and I'm declaring it healed today. When necessary the left is about the same still a healthy-looking surface but no major change and wound area. No evidence of infection and using silver alginate under unna and generally making considerable improvement 3/27 the area on the right medial ankle remains closed the area on the left is about the same as last week. Certainly not any worse we have been using silver alginate under an Unna boot 4/3; the area on the right medial ankle remains closed per the patient. We did not look at this wound. The wound on the left medial ankle is about the same surface looks healthy we have been using silver alginate under an Unna boot 4/10; area on the right medial ankle remains closed per the patient. We did not look at this wound. The wound on the left medial ankle is slightly larger. The patient complains that the [facility] caused  burning pain all week. She also told us that she was a lot more active this week. Changed her back to silver alginate 4/17; right medial ankle still closed per the patient. Left medial ankle is slightly larger. Using silver alginate. She did not tolerate Hydrofera Blue on this area 4/24; right medial ankle remains closed we have not look at this. The left medial ankle continues to get larger today by about a centimeter. We have been using silver alginate under Unna boots. She complains about 4 layer compression as an alternative. She has been up on her feet working on her garden 5/8; right medial ankle remains closed we did not look at this. The left medial ankle has increased in size  about 100%. We have been using silver alginate under Unna boots. She noted increased pain this week and was not surprised that the wound is deteriorated 5/15; no major change in SA however much less erythema ( one week of doxy ocellulitis). 5/22-65 year old female returns at 1 week to the clinic for left medial ankle wound for which we have been using silver alginate under 3 layer compression She was placed on DOXY at last visit - the wound is wider at this visit. She is in 3 layer compression 5/29; change to [facility] last week. I had given her empiric doxycycline 2 weeks ago for a week. She is in 3 layer compression. She complains of a lot of pain and drainage on presentation today. 6/5; using Hydrofera Blue. I gave her doxycycline recently empirically for erythema and pain around the wound. Believe her cultures showed enterococcus which not would not have been well covered by doxycycline nevertheless the wound looks better and I don't feel specifically that the enterococcus needs to be covered. She has a new what looks like a wrap injury on her lateral left ankle. 6/12; she is using Hydrofera Blue. She has a new area on the left anterior lower tibial area. This was a wrap injury last week. 6/19; the patient is  using Hydrofera Blue. She arrived with marked inflammation and erythema around the wound and tenderness. 12/01/18 on evaluation today patient appears to be doing a little bit better based on what I'm hearing from the standpoint of lassos evaluation to this as far as the overall appearance of the wound is concerned. Then sometime substandard she typically sees Dr. Dellia Nims. Nonetheless overall very pleased with the progress that she's made up to this point. No fevers, chills, nausea, or vomiting noted at this time. 7/10; some improvement in the surface area. Aggressively debrided last week apparently. I went ahead with the debridement today although the patient does not tolerate this very well. We have been using Iodoflex. Still a fair amount of drainage 7/17; slightly smaller. Using Iodoflex. 7/24; no change from last week in terms of surface area. We have been using Iodoflex. Surface looks and continues to look somewhat better 7/31; surface area slightly smaller better looking surface. We have been using Iodoflex. This is under Unna boot compression 8/7-Patient presents at 1 week with Unna boot and Iodoflex, wound appears better 8/14-Patient presents at 1 week with Iodoflex, we use the Unna boot, wound appears to be stable better.Patient is getting Botox treatment for the inversion of the foot for tendon release, Next week 8/21; we are using Iodoflex. Unna boot. The wound is stable in terms of surface area. Under illumination there is some areas of the wound that appear to be either epithelialized or perhaps this is adherent slough at this point I was not really clear. It did not wipe off and I was reluctant to debride this today. 8/28; we are using Iodoflex in an Unna boot. Seems to be making good improvement. 9/4; using Iodoflex and wound is slightly smaller. 9/18; we are using Iodoflex with topical silver nitrate when she is here. The wound continues to be smaller 10/2; patient missed her  appointment last week due to GI issues. She left and Iodoflex based dressing on for 2 weeks. Wound is about the same size about the size of a dime on the left medial lower 10/9 we have been using Iodoflex on the medial left ankle wound. She has a new superficial probable wrap injury on the dorsal  left ankle 10/16; we have been using Hydrofera Blue since last week. This is on the left medial ankle 10/23; we have been using Hydrofera Blue since 2 weeks ago. This is on the left medial ankle. Dimensions are better 11/6; using Hydrofera Blue. I think the wound is smaller but still not closed. Left medial ankle 11/13; we have been using Hydrofera Blue. Wound is certainly no smaller this week. Also the surface not as good. This is the remanent of a very large area on her left medial ankle. 11/20; using Sorbact since last week. Wound was about the same in terms of size although I was disappointed about the surface debris 12/11; 3-week follow-up. Patient was on vacation. Wound is measuring slightly larger we have been using Sorbact. 12/18; wound is about the same size however surface looks better last week after debridement. We have been using Sorbact under compression 1/15 wound is probably twice the size of last time increased in length nonviable surface. We have been using Sorbact. She was running a mild fever and missed her appointment last week 1/22; the wound is come down in size but under illumination still a very adherent debris we have been Hydrofera Blue that I changed her to last week 1/29; dimensions down slightly. We have been using Hydrofera Blue 2/19 dimensions are the same however there is rims of epithelialization under illumination. Therefore more the surface area may be epithelialized 2/26; the patient's wound actually measures smaller. The wound looks healthy. We have been using Hydrofera Blue. I had some thoughts about running Apligraf then I still may do that however this looks so much  better this week we will delay that for now 3/5; the wound is small but about the same as last week. We have been using Hydrofera Blue. No debridement is required today. 3/19; the wound is about the size of a dime. Healthy looking wound even under illumination. We have been using Hydrofera Blue. No mechanical debridement is necessary 3/26; not much change from last week although still looks very healthy. We have been using Hydrofera Blue under Unna boots Patient was offered an ankle fusion by podiatry but not until the wound heals with a proceed with this. 4/9; the patient comes in today with her original wound on the medial ankle looking satisfactory however she has some uncontrolled swelling in the middle part of her leg with 2 new open areas superiorly just lateral to the tibia. I think this was probably a wrap issue. She said she felt uncomfortable during the week but did not call in. We have been using Hydrofera Blue 4/16; the wound on the medial ankle is about the same. She has innumerable small areas superior to this across her mid tibia. I think this is probably folliculitis. She is also been working in the yard doing a lot of sweating 4/30; the patient issue on the upper areas across her mid tibia of all healed. I think this was excessive yard work if I remember. Her wound on the medial ankle is smaller. Some debris on this we have been using Hydrofera Blue under Unna boots 5/7; mid tibia. She has been using Hydrofera Blue under an Unna wrap. She is apparently going for her ankle surgery on June 3 10/28/19-Patient returns to clinic with the ankle wound, we are using Hydrofera Blue under Unna wrap, surgery is scheduled for her left foot for June 3 so she will be back for nurse visit next week READMISSION 01/17/2020 Mrs. Poppell is a  65 year old woman we have had in this clinic for a long period of time with severe venous hypertension and refractory wounds on her medial lower legs and ankles  bilaterally. 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 When she was off her legs for a prolonged period example when she fell and suffered a compression fracture things would heal up quite nicely. She is now retired and we managed to heal up the right medial leg wound. The left one was very tiny last time I saw this although still refractory. 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 It got to the point where this was interfering with her walking and she elected to proceed with a ankle arthrodesis to straighten her her ankle and leave her with a functional outcome for mobilization. The patient was referred to Dr. Doren Custard and really this took some time to arrange. Dr. Doren Custard saw her on 12/07/2019. Once again he verified that she had no arterial issues. She had previously had an angiogram several years ago. Follow-up ABIs on the left showed an ABI of 1.12 with triphasic waveforms and a TBI of 0.92. She is felt to have chronic deep venous insufficiency but I do not think it was felt that anything could be done from about this from an ablation point of view. At the time Dr. Doren Custard saw this patient the wounds actually look closed via the pictures in his clinic. The patient finally underwent her surgery on 12/15/2019. This went reasonably well and there was a good anatomic outcome. She developed a small distal wound dehiscence on the lateral part of the surgical wound. However more problematically she is developed recurrence of the wound on the medial left ankle. There are actually 2 wounds here one in the distal lower leg and 1 pretty much at the level of the medial malleolus. It is a more distal area that is more problematic. She has been using Hydrofera Blue which started on Friday before this she was  simply Ace wrapping. There was a culture done that showed Pseudomonas and she is on ciprofloxacin. A recent CNS on 8/11 was negative. The patient reports some pain but I generally think this is improving. She is using a cam boot completely nonweightbearing using a walker for pivot transfers and a wheelchair 8/24; not much improvement unfortunately she has a surgical wound on the lateral part in the venous insufficiency wound medially. The bottom part of the medial insufficiency wound is still necrotic there is exposed tendon here. We have been using Hydrofera Blue under compression. Her edema control is however better 8/31; patient in for follow-up of his surgical wound on the lateral part of her left leg and chronic venous insufficiency ulcers medially. We put her back in compression last week. She comes in today with a complaint of 3 or 4 days worth of increasing pain. She felt her cam walker was rubbing on the area on the back of her heel. However there is intense erythema seems more likely she has cellulitis. She had 2 cultures done when she was seeing podiatry in the postop. One of them in late July showed Pseudomonas and she received a course of ciprofloxacin the other was negative on 8/11 she is allergic to penicillin with anaphylactoid complaints of hives oral swelling via information  in epic 9/9; when I saw this patient last week she had intense anterior erythema around her wound on the right lateral heel and ankle and also into the right medial heel. Some of this was no doubt drainage and her walker boot however I was convinced she had cellulitis. I gave her Levaquin and Bactrim she is finishing up on this now. She is following up with Dr. [name] he saw her yesterday. He is taken her out of the walking boot of course she is still nonweightbearing. Her x-ray was negative for any worrisome features such as soft tissue air etc. Things are a lot better this week. She has home health. We have been  using Hydrofera Blue under an [employer] which she put back on yesterday. I did not wrap her last week 9/17; her surrounding skin looks a lot better. In fact the area on the left lateral ankle has just a scant amount of eschar. The only remaining wound is the large area on the left medial ankle. Probably about 60% of this is healthy granulation at the surface however she has a significant divot distally. This has adherent debris in it. I been using debridement and silver collagen to try and get this area to fill-in although I do not think we have made much progress this week 9/24; the patient's wound on the left medial ankle looks a lot better. The deeper divot area distally still requires debridement but this is cleaning up quite nicely we have been using silver collagen. The patient is complaining of swelling in her foot and is worried that that is contributing to the nonhealing of the ankle wound. She is also complaining of numbness in her anterior toes 10/4; left medial ankle. The small area distally still has a divot with necrotic material that I have been debriding away. This has an undermining area. She is approved for Apligraf. She saw Dr. [name] her surgeon on 10/1. I think he declared himself is satisfied with the condition of things. Still nonweightbearing till the end of the month. 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 Her surgical wound is well closed. There is no evidence of infection 10/11; the patient arrived in clinic today with the expectation that we be able to put an Apligraf on this area after debridement however she arrives with a relatively large amount of green drainage on the dressing. The patient states that this started on Friday. She has not been systemically unwell. 10/19; culture I did last week showed both Enterococcus and Pseudomonas. I think this came in separate parts because I stopped her ciprofloxacin I gave her and prescribed her  linezolid however now looking at the final culture result this is Pseudomonas which is resistant to quinolones. She has not yet picked up the linezolid apparently phone issues. We are also trying to get a topical antibiotic out of [facility] in [place] they can be applied by home health. She is still having green drainage 10/16; the patient has her topical antibiotic from [facility] in [place]. This is a compounded gel with vancomycin and ciprofloxacin and gentamicin. We are applying this on the wound bed with silver alginate over the top with Unna boot wraps. She arrives in clinic today with a lot less ominous looking drainage although she is only use this topical preparation once the second time today. She sees Dr. [name] her surgeon on Friday she has home health changing the dressing 11/2; still using her compounded topical  antibiotic under silver alginate. Surface is cleaning up there is less drainage. We had an Apligraf for her today and I elected to apply it. A light coating of her antibiotic 04/25/2020 upon evaluation today patient appears to be doing well with regard to her ankle ulcer. There is a little bit of slough noted on the surface of the wound I am can have to perform sharp debridement to clear this away today. With that being said other than that fact overall I feel like she is making progress and we do see some new epithelial growth. There is also some improvement in the depth of the wound and that distal portion. There is little bit of slough there as well. 12/7; 2-week follow-up. Apligraf #3. Dimensions are smaller. Closing in especially inferiorly. Still some surface debris. Still using the [facility] topical antibiotic but I told her that I don't think this needs to be renewed 12/21; 2-week follow-up. Apligraf #4 dimensions are smaller. Nice improvement 06/05/2020; 2-week follow-up. The patient's wound on the left medial ankle looks really excellent. Nice granulation.  Advancing epithelialization no undermining no evidence of infection. We would have to reapply for another Apligraf but with the condition of this wound I did not feel strongly about it. We used Hydrofera Blue under the same degree of compression. She follows up with Dr. [name] her surgeon a week Friday 06/13/2020 upon evaluation today patient appears to be doing excellent in regard to her wound. She has been tolerating the dressing changes without complication. Fortunately there is no signs of active infection at this time. No fevers, chills, nausea, vomiting, or diarrhea. She was using Hydrofera Blue last week. 06/20/2020 06/20/2020 on evaluation today patient actually appears to be doing excellent in regard to her wound. This is measuring better and looking much better as well. She has been using the collagen that seems to be doing better for her as well even though the [facility] was and is not sticking or feeling as rough on her wound. She did see Dr. [name] on Friday he is very pleased he also stated none of the hardware has shifted. That is great news 1/27; the patient has a small clean wound all that is remaining. I agree that this is too small to really consider an Apligraf. Under illumination the surface is looking quite good. We have been using collagen although the dimensions are not any better this week 2/2; the patient has a small clean wound on the left medial ankle. Although this left of her substantial original areas. Measurements are smaller. We have been using polymen Ag under an Haematologist. 2/10; small area on the left medial ankle. This looks clean nothing to debride however dimensions are about the same we have been using polymen I think now for 2 weeks 2/17; not much change in surface area. We have been using polymen Ag without any improvement. 3/17; 1 month follow-up. The patient has been using endoform without any improvement in fact I think this looks worse with more depth and  more expansion 3/24; no improvement. Perhaps less debris on the surface. We have been using Sorbact for 1 week 4/4; wound measures larger. She has edema in her leg and her foot which she tells as her wrap came down. We have been using Unna boots. Sorbact of the wound. She has been approved for Apligraf 09/12/2020 upon evaluation today patient appears to be doing well with regard to her wound. We did get the Apligraf reapproved which is great  news we have that available for application today. Fortunately there is no signs of infection and overall the patient seems to be doing great. The wound bed is nice and clean. 4/27; patient presents for her second application of Apligraf. She states over the past week she has been on her feet more often due to being outside in her garden. She has noted more swelling to her foot as a result. She denies increased warmth, pain or erythema to the wound site. 10/10/2020 upon evaluation today patient appears to be doing well with regard to her wound which does not appear to be quite as irritated as last week from what I am hearing. With that being said unfortunately she is having issues with some erythema and warmth to touch as well as an increase size. I do believe this likely is infected. 10/17/2020 upon evaluation today patient appears to be doing excellent in regard to her wound this is significantly improved as compared to last week. Fortunately I think that the infection is much better controlled at this point. She did have evidence of both Enterococcus as well as Staphylococcus noted on culture. Enterococcus really would not be helped significantly by the Cipro but the wound is doing so much better I am under the assumption that the Staphylococcus is probably the main organism that is causing the current infection. Nonetheless I think that she is doing excellent as far as that is concerned and I am very pleased in that regard. I would therefore recommend she  continue with the Cipro. 10/31/2020 upon evaluation today patient appears to be doing well with regard to her wound. She has been tolerating the dressing changes without complication. Fortunately there is no signs of active infection and overall I am extremely pleased with where things stand today. No fevers, chills, nausea, vomiting, or diarrhea. With that being said she does have some green drainage coming from the wound and although it looks okay I am a little concerned about the possibility of a continuing infection. Specifically with Pseudomonas. For that reason I will go ahead and send in a prescription for Cipro for her to be continued. 11/14/2020 upon evaluation today patient appears to be doing very well currently in regard to her wound on her leg. She has been tolerating the dressing changes without complication. Fortunately I feel like the infection is finally under good control here. Unfortunately we do not have the Apligraf for application today although we can definitely order to have it in place for next week. That will be her fifth and final of the current series. Nonetheless I feel like her wound is really doing quite well which is great news. 11/21/2020 upon evaluation today patient appears to be doing well with regard to her wound on the medial ankle. Fortunately I think the infection is under control and I do believe we can go ahead and reapply the Apligraf today. She is in agreement with that plan. There does not appear to be any signs of active infection at this time which is great news. No fevers, chills, nausea, vomiting, or diarrhea. 12/05/2020 upon evaluation today patient's wound bed actually showed signs of good granulation epithelization at this point. There does not appear to be any signs of infection which is great news and overall very pleased with where things stand. Overall the patient seems to be doing fairly well in my opinion with regard to her wound although I do believe  she continues to build up a lot of biofilm I  think she could benefit from using PuraPly at this point. 12/12/2020 upon evaluation today patient's wound actually appears to be doing decently well today. The Unna boot has not been quite as well-tolerated so that more uncomfortable for her and even causing some pressure over the plate on the lateral portion of her foot which is 90 where the wound is. There did not appear to be any significant deep tissue injury with that there may be a minimal change in the skin noted I think that we may want to go back to the Coflex 2 layer which is a little bit easier on her skin it seems. 12/19/2020 upon evaluation today patient actually seems to be making great progress with the PuraPly currently. She in fact seems to be much better as far as the overall appearance of the wound bed is concerned I am very happy in this regard. I do not see any signs of of infection which is great news as well. No fevers, chills, nausea, vomiting, or diarrhea. 12/26/2020 upon evaluation today patient appears to actually be doing better in regard to her wound on the left medial ankle region. The surface of the wound is actually doing significantly better which is great news. There does not appear to be any signs of infection which is also great news and in general I am extremely pleased with where we stand today. 01/02/2021 upon evaluation today patient appears to be doing well with regard to her wound. In fact this is showing signs of excellent improvement and very pleased with where things stand. In fact the last 3 appointments have all shown signs of this getting smaller which is excellent news. I have not even had to perform any debridement and today is no exception. Overall I feel like this is dramatically improved compared to previous. T oday is PuraPly application #4. 11/01/930 upon evaluation today patient appears to be doing excellent in regard to her wound this is continue to show  signs of improvement and overall I am extremely pleased with where we stand today. She is actually here for PuraPly application #5. Every time we have applied this she is noted definite improvement on measurements. 01/23/2021 upon evaluation today patient is actually making good progress in regard to her wound. This was actually on just a little bit longer this time compared to previous due to the fact that she did have to go out of town. She is actually here for PuraPly application #6. We have definitely been seeing improvements in the overall quality of the tissue on the surface of the wound which is awesome news. In general I think that the patient seems to be continuing to make great progress here. 01/30/2021 upon evaluation today patient's wound is actually doing excellent. There is really not any significant biofilm buildup which is great news and overall I am extremely pleased with where things stand today. There does not appear to be any signs of active infection. No fevers, chills, nausea, vomiting, or diarrhea. 02/06/2021 upon evaluation today patient's wound is actually showing signs of excellent improvement which is great news. We continue to see the benefit of the PuraPly this is doing a great job the wound seems not really irritated whatsoever and is showing signs of good granulation at this point. Overall I am extremely happy with what we are seeing. The patient likewise is happy to hear all of this as well. 02/13/2021 upon evaluation today patient appears to be doing well with regard to her wound. This  again is measuring smaller today and I am very pleased in this regard. Fortunately there does not appear to be any signs of active infection at this time which is good news from a systemic standpoint. Locally there is still some significant drainage which she does have is concerned about infection locally. No fevers, chills, nausea, vomiting, or diarrhea. 02/20/2021 upon evaluation today  patient actually appears to be doing decently well in regard to her wound. She has been tolerating the dressing changes without complication. I do believe the PuraPly is helping wound bed does appear to be doing better. There is no evidence of active infection locally or systemically at this point visually although on fluorescence imaging there still appear to be bacterial load and bioburden noted. 02/27/2021 upon evaluation today patient fortunately continues to show signs of improvement with use of the PuraPly currently. Subsequently we did review her culture results and to be honest I think that the Bactrim is probably the best option to have her continue at this point. For that reason I am get a go ahead and send in a refill today for her for an additional 2 weeks. Nonetheless I think that we are making excellent progress here. It was Enterococcus and Staphylococcus that were noted she is allergic to penicillin so there is not much I can do from the Enterococcus standpoint though the staff does seem to be sensitive to the Bactrim. 03/06/2021 upon evaluation today patient appears to be doing well with regard to her wound. This is definitely showing signs of improvement which is great news and overall I am extremely pleased with where things stand today. There does not appear to be any signs of active infection which is great news and overall and I do believe that we are headed in the appropriate direction I think the PuraPly is doing an awesome job for her. 03/20/2021 upon evaluation today patient actually appears to be doing well with regard to her wound. We have been using the PuraPly although last week when I was out of the office they actually just used endoform nonetheless this still seems to be doing great. Fortunately there does not appear to be any signs of active infection which is great news overall I think the patient is making wonderful progress. 03/27/2021 upon evaluation today patient  appears to be doing well with regard to her wound in fact there is a little drainage but otherwise her does not appear to be any major signs of open wounds nor infection at this time. Overall I am extremely pleased with where things stand. No fevers, chills, nausea, vomiting, or diarrhea. 04/03/2021 upon evaluation today patient presents for reevaluation here in the clinic and overall she seems to be doing quite well. Fortunately there does not appear to be any signs of active infection which is great news and in general I am very pleased with how things appear. There is still a very small opening remaining but in general she is looking much better. Fortunately I do not think there is any need for the PuraPly at this point. 04/10/2021 upon evaluation today patient's wound is actually showing signs of excellent improvement. I am actually very pleased with where it stands today. I think she is very close to complete closure in fact this may be healed today. Either way I think that she may benefit from using her own compression socks for the next week and then will subsequently see how things stand following. 04/17/2021 upon evaluation today patient appears to  be doing well with regard to her wound on the ankle region. This is actually showing signs of excellent improvement which is great news. I do not see any signs of active infection at this time which is great and no open wound. With that being said on the lateral portion of her ankle right at the base of her plate she actually has an area of pressure that occurred over the past week when she was not in the wrap or we been padding her. This does seem to be an issue which I think is good to be an ongoing problem I am can actually get in touch with Dr. [name] today to see what we can do in that regard. 04/24/2021 upon evaluation today patient appears to be doing well with regard to her ankle region for the most part although there does appear to be some  fluid trapped underneath the area where we thought this was healed last week. Unfortunately I think at this time that we can have to reopen and clean away some of this area and then subsequently depending on how things do over the next week or so we will see about discharge but right now I do not think that is the right way to go. She voiced understanding. 05/01/2021 upon evaluation today patient's wound still appears to be open unfortunately. I do believe that we do need to see what we can do about trying to get this closed I believe compression wrapping is probably can to be indicated here. Fortunately there is no signs of active infection at this time. 05/08/2021 upon evaluation today patient appears to be doing okay in regard to her ulcer this is very superficial and we will see any major issues here. The biggest problem currently that I find is that we just cannot get this area to completely seal up. She seemed to almost be and then things digressed a little bit. Right now is not too bad but we do need to see if we can make some difference here. I think maybe switching over to collagen may be better. 05/15/2021 upon evaluation today patient actually appears to be doing pretty well in regard to her wound. This is measuring a little smaller and looking much better. She did see orthopedics today there to me making her a insert for her shoe to try to help straighten out her gait. I think this can be beneficial for her as well. Electronic Signature(s) Signed: 05/15/2021 2:01:08 PM By: [name] PA-C Entered By: [name] on 05/15/2021 14:01:07 -------------------------------------------------------------------------------- Physical Exam Details Patient Name: Date of Service: [facility] [name], [name] 05/15/2021 1:15 PM Medical Record Number: [id number] Patient Account Number: [id number] Date of Birth/Sex: Treating RN: 1955/06/12 (65 y.o. [name] Provider: [name] Other Clinician: Referring Provider: Treating Provider/Extender: [name] in Treatment: 50 Constitutional Well-nourished and well-hydrated in no acute distress. Respiratory normal breathing without difficulty. Psychiatric this patient is able to make decisions and demonstrates good insight into disease process. Alert and Oriented x 3. pleasant and cooperative. Notes Upon inspection patient's wound bed actually showed signs of good granulation and epithelization at this point. Fortunately I think that she is making progress here. There is a little bit of minimal slough noted on the surface of the wound I was able to clean this away with saline and gauze no sharp debridement was even necessary today. Electronic Signature(s) Signed: 05/15/2021 2:01:34 PM By: Melburn Hake,  Margarita Grizzle PA-C Entered By: [name] on 05/15/2021 14:01:34 -------------------------------------------------------------------------------- Physician Orders Details Patient Name: Date of Service: [facility] [name], [name] 05/15/2021 1:15 PM Medical Record Number: [id number] Patient Account Number: [id number] Date of Birth/Sex: Treating RN: 1955/11/11 (65 y.o. [name] Primary Care Provider: [name] Other Clinician: Referring Provider: Treating Provider/Extender: [name] in Treatment: 43 Verbal / Phone Orders: No Diagnosis Coding ICD-10 Coding Code Description [phone] Chronic venous hypertension (idiopathic) with ulcer and inflammation of left lower extremity L97.828 Non-pressure chronic ulcer of other part of left lower leg with other specified severity L97.328 Non-pressure chronic ulcer of left ankle with other specified severity Follow-up Appointments ppointment in 1 week. - with Margarita Grizzle Return A Edema Control - Lymphedema / SCD / Other Bilateral Lower Extremities Patient to wear own compression stockings every day. Exercise  regularly Moisturize legs daily. Non Wound Condition pply the following to affected area as directed: - Apply silicone bordered foam to left lateral foot for protection A Wound Treatment Wound #15R - Malleolus Wound Laterality: Left, Medial Cleanser: Wound Cleanser (Generic) 1 x Per Day/15 Days Discharge Instructions: Cleanse the wound with wound cleanser prior to applying a clean dressing using gauze sponges, not tissue or cotton balls. Prim Dressing: Promogran Prisma Matrix, 4.34 (sq in) (silver collagen) 1 x Per Day/15 Days ary Discharge Instructions: Moisten collagen with saline or hydrogel Secondary Dressing: Woven Gauze Sponge, Non-Sterile 4x4 in (Generic) 1 x Per Day/15 Days Discharge Instructions: Apply over primary dressing as directed. Compression Wrap: CoFlex TLC XL 2-layer Compression System 4x7 (in/yd) 1 x Per Day/15 Days Discharge Instructions: Apply CoFlex 2-layer compression as directed. (alt for 4 layer) Electronic Signature(s) Signed: 05/15/2021 3:33:06 PM By: [name] PA-C Signed: 05/15/2021 5:19:40 PM By: [name] Entered By: [name] on 05/15/2021 13:54:40 -------------------------------------------------------------------------------- Problem List Details Patient Name: Date of Service: [facility] [name], [name] 05/15/2021 1:15 PM Medical Record Number: [id number] Patient Account Number: [id number] Date of Birth/Sex: Treating RN: [birth year] (65 y.o. [name] Provider: [name] Other Clinician: Referring Provider: Treating Provider/Extender: [name] in Treatment: 2 Active Problems ICD-10 Encounter Code Description Active Date MDM Diagnosis I87.332 Chronic venous hypertension (idiopathic) with ulcer and inflammation of left 01/17/2020 No Yes lower extremity L97.828 Non-pressure chronic ulcer of other part of left lower leg with other specified 01/17/2020 No Yes severity L97.328  Non-pressure chronic ulcer of left ankle with other specified severity 01/17/2020 No Yes Inactive Problems ICD-10 Code Description Active Date Inactive Date L03.116 Cellulitis of left lower limb 01/31/2020 01/31/2020 T81.31XD Disruption of external operation (surgical) wound, not elsewhere classified, subsequent 01/17/2020 01/17/2020 encounter Resolved Problems Electronic Signature(s) Signed: 05/15/2021 1:33:29 PM By: [name] Signed: 05/15/2021 3:33:06 PM By: [name] PA-C Previous Signature: 05/15/2021 1:21:23 PM Version By: [name] PA-C Entered By: [name] on 05/15/2021 13:33:29 -------------------------------------------------------------------------------- Progress Note Details Patient Name: Date of Service: [name], [name] 05/15/2021 1:15 PM Medical Record Number: [id number] Patient Account Number: [id number] Date of Birth/Sex: Treating RN: [birth year] (65 y.o. [name] Provider: [name] Other Clinician: Referring Provider: Treating Provider/Extender: [name] in Treatment: 42 Subjective Chief Complaint Information obtained from Patient patient is been followed long-term in this clinic for venous insufficiency ulcers with inflammation, hypertension and ulceration over the medial ankle bilaterally. 01/17/2020; this is a patient who is here for review of postoperative wounds on  the left lateral ankle and recurrence of venous stasis ulceration on the left medial History of Present Illness (HPI) the remaining wound is over the left medial ankle. Similar wound over the right medial ankle healed largely with use of Apligraf. Most recently we have been using Hydrofera Blue over this wound with considerable improvement. The patient has been extensively worked up in the past for her venous insufficiency and she is not a candidate for antireflux surgery although I have none of the details available  currently. 08/24/14; considerable improvement today. About 50% of this wound areas now epithelialized. The base of the wound appears to be healthier granulation.as opposed to last week when she had deteriorated a considerable improvement 08/17/14; unfortunately the wound has regressed somewhat. The areas of epithelialization from the superior aspect are not nearly as healthy as they were last week. The patient thinks her Hydrofera Blue slipped. 09/07/14; unfortunately the area has markedly regressed in the 2 weeks since I've seen this. There is an odor surrounding erythema. The healthy granulation tissue that we had at the base of the wound now is a dusky color. The nurse reports green drainage 09/14/14; the area looks somewhat better than last week. There is less erythema and less drainage. The culture I did did not show any growth. Nevertheless I think it is better to continue the Cipro and doxycycline for a further week. The remaining wound area was debridement. 09/21/14. Wound did not require debridement last week. Still less erythema and less drainage. She can complete her antibiotics. The areas of epithelialization in the superior aspect of the wound do not look as healthy as they did some weeks ago 10/05/14 continued improvement in the condition of this wound. There is advancing epithelialization. Less aggressive debridement required 10/19/14 continued improvement in the condition and volume of this wound. Less aggressive debridement to the inferior part of this to remove surface slough and fibrinous eschar 11/02/14 no debridement is required. The surface granulation appears healthy although some of her islands of epithelialization seem to have regressed. No evidence of infection 11/16/14; lites surface debridement done of surface eschar. The wound does not look to be unhealthy. No evidence of infection. Unfortunately the patient has had podiatry issues in the right foot and for some reason has  redeveloped small surface ulcerations in the medial right ankle. Her original presentation involved wounds in this area 11/23/14 no debridement. The area on the right ankle has enlarged. The left ankle wound appears stable in terms of the surface although there is periwound inflammation. There has been regression in the amount of new skin 11/30/14 no debridement. Both wound areas appear healthy. There was no evidence of infection. The the new area on the right medial ankle has enlarged although that both the surfaces appear to be stable. 12/07/14; Debridement of the right medial ankle wound. No no debridement was done on the left. 12/14/14 no major change in and now bilateral medial ankle wounds. Both of these are very painful but the no overt evidence of infection. She has had previous venous ablation 12/21/14; patient states that her right medial ankle wound is considerably more painful last week than usual. Her left is also somewhat painful. She could not tolerate debridement. The right medial ankle wound has fibrinous surface eschar 12/28/14 this is a patient with severe bilateral venous insufficiency ulcers. For a considerable period of time we actually had the one on the right medial ankle healed however this recently opened up again in June. The left  medial ankle wound has been a refractory area with some absent flows. We had some success with Hydrofera Blue on this area and it literally closed by 50% however it is recently opened up Foley. Both of these were debridement today of surface eschar. She tolerates this poorly 01/25/15: No change in the status of this. Thick adherent escar. Very poor tolerance of any attempt at debridement. I had healed the right medial malleolus wound for a considerable amount of time and had the left one down to about 50% of the volume although this is totally regressed over the last 48 weeks. Further the right leg has reopened. she is trying to make a appointment with  pain and vascular, previous ablations with Dr. Aleda Grana. I do not believe there is an arterial insufficiency issue here 02/01/15 the status of the adherent eschar bilaterally is actually improved. No debridement was done. She did not manage to get vascular studies done 02/08/15 continued debridement of the area was done today. The slough is less adherent and comes off with less pressure. There is no surrounding infection peripheral pulses are intact 02/15/15 selective debridement with a disposable curette. Again the slough is less adherent and comes off with less difficulty. No surrounding infection peripheral pulses are intact. 02/22/15 selective debridement of the right medial ankle wound. Slough comes off with less difficulty. No obvious surrounding infection peripheral pulses are intact I did not debridement the one on the left. Both of these are stable to improved 03/01/15 selective debridement of both wound areas using a curette to. Adherent slough cup soft with less difficulty. No obvious surrounding infection. The patient tells me that 2 days ago she noted a rash above the right leg wrap. She did not have this on her lower legs when she change this over she arrives with widespread left greater than right almost folliculitis-looking rash which is extremely pruritic. I don't see anything to culture here. There is no rash on the rest of her body. She feels well systemically. 03/08/15; selective debridement of both wounds using a curette. Base of this does not look unhealthy. She had limegreen drainage coming out of the left leg wound and describes a lot of drainage. The rash on her left leg looks improved to. No cultures were done. 03/22/15; patient was not here last week. Basal wounds does not look healthy and there is no surrounding erythema. No drainage. There is still a rash on the left leg that almost looks vasculitic however it is clearly limited to the top of where the wrap would  be. 04/05/15; on the right required a surgical debridement of surface eschar and necrotic subcutaneous tissue. I did not debridement the area on the left. These continue to be large open wounds that are not changing that much. We were successful at one point in healing the area on the right, and at the same time the area on the left was roughly half the size of current measurements. I think a lot of the deterioration has to do with the prolonged time the patient is on her feet at work 04/19/15 I attempted-like surface debridement bilaterally she does not tolerate this. She tells me that she was in allergic care yesterday with extreme pain over her left lateral malleolus/ankle and was told that she has an "sprain" 05/03/15; large bilateral venous insufficiency wounds over the medial malleolus/medial aspect of her ankles. She complains of copious amounts of drainage and his usual large amounts of pain. There is some increasing erythema around  the wound on the right extending into the medial aspect of her foot to. historically she came in with these wounds the right one healed and the left one came down to roughly half its current size however the right one is reopened and the left is expanded. 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 05/10/15 large bilateral venous insufficiency wounds. There is less adherence surface left however the surface culture that I did last week grew pseudomonas therefore bilateral selective debridement score necessary. There is surrounding erythema. The patient describes severe bilateral drainage and a lot of pain in the left ankle. Apparently her podiatrist was were ready to do a cortisone shot 05/17/15; the patient complains of pain and again copious amounts of drainage. 05/24/15; we used Iodo flex last week. Patient notes considerable improvement in wound drainage. Only needed to change this once. 05/31/15; we continued  Iodoflex; the base of these large wounds bilaterally is not too bad but there is probably likely a significant bioburden here. I would like to debridement just doesn't tolerate it. 06/06/14 I would like to continue the Iodoflex although she still hasn't managed to obtain supplies. She has bilateral medial malleoli or large wounds which are mostly superficial. Both of them are covered circumferentially with some nonviable fibrinous slough although she tolerates debridement very poorly. She apparently has an appointment for an ablation on the right leg by interventional radiology. 06/14/15; the patient arrives with the wounds and static condition. We attempted a debridement although she does not do well with this secondary to pain. I 07/05/15; wounds are not much smaller however there appears to be a cleaner granulating base. The left has tight fibrinous slough greater than the right. Debridement is tolerated poorly due to pain. Iodoflex is done more for these wounds in any of the multitude of different dressings I have tried on the left 1 and then subsequently the right. 07/12/15; no change in the condition of this wound. I am able to do an aggressive debridement on the right but not the left. She simply cannot tolerate it. We have been using Iodoflex which helps somewhat. It is worthwhile remembering that at one point we healed the right medial ankle wound and the left was about 25% of the current circumference. We have suggested returning to vascular surgery for review of possible further ablations for one reason or another she has not been able to do this. 07/26/15 no major change in the condition of either wound on her medial ankle. I did not attempt to debridement of these. She has been aggressively scrubbing these while she is in the shower at home. She has her supply of Iodoflex which seems to have done more for these wounds then anything I have put on recently. 08/09/15 wound area appears larger  although not verified by measurements. Using Iodoflex 09/05/2015 -- she was here for avisit today but had significant problems with the wound and I was asked to see her for a physician opinion. I have summarize that this lady has had surgery on her left lower extremity about 10 years ago where the possible veins stripping was done. She has had an opinion from interventional radiology around November 2016 where no further sclerotherapy was ordered. The patient works 12 hours a day and stands on a concrete floor with work boots and is unable to get the proper compression she requires and cannot elevate her limbs appropriately at any  given time. She has recently grown Pseudomonas from her wound culture but has not started her ciprofloxacin which was called in for her. 09/13/15 this continues to be a difficult situation for this patient. At one point I had this wound down to a 1.5 x 1.5" wound on her left leg. This is deteriorated and the right leg has reopened. She now has substantial wounds on her medial calcaneus, malleoli and into her lower leg. One on the left has surface eschar but these are far too painful for me to debridement here. She has a vascular surgery appointment next week to see if anything can be done to help here. I think she has had previous ablations several years ago at [facility]. She has no major edema. She tells me that she did not get product last time [facility]] and went for several days without it. She continues to work in work boots 12 hours a day. She cannot get compression/4-layer under her work boots. 09/20/15 no major change. Periwound edema control was not very good. Her point with pain and vascular is next Wednesday the 25th 09/28/15; the patient is seen vascular surgery and is apparently scheduled for repeat duplex ultrasounds of her bilateral lower legs next week. 10/05/15; the patient was seen by Dr. Doren Custard of vascular surgery. He feels that she should have arterial  insufficiency excluded as cause/contributed to her nonhealing stage she is therefore booked for an arteriogram. She has apparently monophasic signals in the dorsalis pedis pulses. She also of course has known severe chronic venous insufficiency with previous procedures as noted previously. I had another long discussion with the patient today about her continuing to work 12 hour shifts. I've written her out for 2 months area had concerns about this as her work location is currently undergoing significant turmoil and this may lead to her termination. She is aware of this however I agree with her that she simply cannot continue to stand for 12 hours multiple days a week with the substantial wound areas she has. 10/19/15; the Dr. Doren Custard appointment was largely for an arteriogram which was normal. She does not have an arterial issue. He didn't make a comment about her chronic venous insufficiency for which she has had previous ablations. Presumably it was not felt that anything additional could be done. The patient is now out of work as I prescribed 2 weeks ago. Her wounds look somewhat less aggravated presumably because of this. I felt I would give debridement another try today 10/25/15; no major change in this patient's wounds. We are struggling to get her product that she can afford into her own home through her insurance. 11/01/15; no major change in the patient's wounds. I have been using silver alginate as the most affordable product. I spoke to Dr. Marla Roe last week with her requested take her to the OR for surgical debridement and placement of ACEL. Dr. Marla Roe told me that she would be willing to do this however [facility] will not cover this, fortunately the patient has [place] healthcare of some variant 11/08/15; no major change in the patient's wounds. She has been completely nonviable surface that this but is in too much pain with any attempted debridement are clinic. I have arranged  for her to see Dr. Marla Roe ham of plastic surgery and this appointment is on Monday. I am hopeful that they will take her to the OR for debridement, possible ACEL ultimately possible skin graft 11/22/15 no major change in the patient's  wounds over her bilateral medial calcaneus medial malleolus into the lower legs. Surface on these does not look too bad however on the left there is surrounding erythema and tenderness. This may be cellulitis or could him sleepy tinea. 11/29/15; no major changes in the patient's wounds over her bilateral medial malleolus. There is no infection here and I don't think any additional antibiotics are necessary. There is now plan to move forward. She sees Dr. Marla Roe in a week's time for preparation for operative debridement and ACEL placement I believe on 7/12. She then has a follow-up appointment with Dr. Marla Roe on 7/21 12/28/15; the patient returns today having been taken to the [place] by Dr. Marla Roe 12/12/15 she underwent debridement, intraoperative cultures [which were negative]. She had placement of a wound VAC. Parent really ACEL was not available to be placed. The wound VAC foam apparently adhered to the wound since then she's been using silver alginate, Xeroform under Ace wraps. She still says there is a lot of drainage and a lot of pain 01/31/16; this is a patient I see monthly. I had referred her to Dr. Marla Roe him of plastic surgery for large wounds on her bilateral medial ankles. She has been to the OR twice once in early July and once in early August. She tells me over the last 3 weeks she has been using the wound VAC with ACEL underneath it. On the right we've simply been using silver alginate. Under Kerlix Coban wraps. 02/28/16; this is a patient I'm currently seeing monthly. She is gone on to have a skin graft over her large venous insufficiency ulcer on the left medial ankle. This was done by Dr. Marla Roe him. The patient is a bit perturbed about why  she didn't have one on her right medial ankle wound. She has been using silver alginate to this. 03/06/16; I received a phone call from her plastic surgery Dr. Marla Roe. She expressed some concern about the viability of the skin graft she did on the left medial ankle wound. Asked me to place Endoform on this. She told me she is not planning to do a subsequent skin graft on the right as the left one did not take very well. I had placed Hydrofera Blue on the right 03/13/16; continue to have a reasonably healthy wound on the right medial ankle. Down to 3 mm in terms of size. There is epithelialization here. The area on the left medial ankle is her skin graft site. I suppose the last week this looks somewhat better. She has an open area inferiorly however in the center there appears to be some viable tissue. There is a lot of surface callus and eschar that will eventually need to come off however none of this looked to be infected. Patient states that the is able to keep the dressing on for several days which is an improvement. 03/20/16 no major change in the circumference of either wound however on the left side the patient was at Dr. Eusebio Friendly office and they did a debridement of left wound. 50% of the wound seems to be epithelialized. I been using Endoform on the left Hydrofera Blue in the right 03/27/16; she arrives today with her wound is not looking as healthy as they did last week. The area on the right clearly has an adherent surface to this a very similar surface on the left. Unfortunately for this patient this is all too familiar problem. Clearly the Endoform is not working and will need to change that today  that has some potential to help this surface. She does not tolerate debridement in this clinic very well. She is changing the dressing wants 04/03/16; patient arrives with the wounds looking somewhat better especially on the right. Dr. Migdalia Dk change the dressing to silver alginate when she  saw her on Monday and also sold her some compression socks. The usefulness of the latter is really not clear and woman with severely draining wounds. 04/10/16; the patient is doing a bit of an experiment wearing the compression stockings that Dr. Migdalia Dk provided her to her left leg and the out of legs based dressings that we provided to the right. 05/01/16; the patient is continuing to wear compression stockings Dr. Migdalia Dk provided her on the left that are apparently silver impregnated. She has been using Iodoflex to the right leg wound. Still a moderate amount of drainage, when she leaves here the wraps only last for 4 days. She has to change the stocking on the left leg every night 05/15/16; she is now using compression stockings bilaterally provided by Dr. Marla Roe. She is wearing a nonadherent layer over the wounds so really I don't think there is anything specific being done to this now. She has some reduction on the left wound. The right is stable. I think all healing here is being done without a specific dressing 06/09/16; patient arrives here today with not much change in the wound certainly in diameter to large circular wounds over the medial aspect of her ankle bilaterally. Under the light of these services are certainly not viable for healing. There is no evidence of surrounding infection. She is wearing compression stockings with some sort of silver impregnation as prescribed by Dr. Marla Roe. She has a follow-up with her tomorrow. 06/30/16; no major change in the size or condition of her wounds. These are still probably covered with a nonviable surface. She is using only her purchase stockings. She did see Dr. Marla Roe who seemed to want to apply Dakin's solution to this I'm not extreme short what value this would be. I would suggest Iodoflex which she still has at home. 07/28/16; I follow Mrs. Badgett episodically along with Dr. Marla Roe. She has very refractory venous insufficiency  wounds on her bilateral medial legs left greater than right. She has been applying a topical collagen ointment to both wounds with Adaptic. I don't think Dr. Marla Roe is planning to take her back to the OR. 08/19/16; I follow Mrs. Jeneen Rinks on a monthly basis along with Dr. Marla Roe of plastic surgery. 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 I been following her for a number of years. At one point I was able to get the right medial malleolus wound to heal and had the left medial malleolus down to about half its current size however and I had to send her to plastic surgery for an operative debridement. Since then things have been stable to slightly improve the area on the right is slightly better one in the left about the same although there is much less adherent surface than I'm used to with this patient. She is using some form of liquid collagen gel that Dr. Marla Roe provided a Kerlix cover with the patient's own pressure stockings. She tells me that she has extreme pain in both ankles and along the lateral aspect of both feet. She has been unable to work for some period of time. She is telling me she is retiring at the beginning of April.  She sees Dr. Doran Durand of orthopedics next week 09/22/16; patient has not seen Dr. Marla Roe since the last time she is here. I'm not really sure what she is using to the wounds other than bits and pieces of think she had left over including most recently Hydrofera Blue. She is using juxtalite stockings. She is having difficulty with her husband's recent illness "stroke". She is having to transport him to various doctors appointments. Dr. Marla Roe left her the option of a repeat debridement with ACEL however she has not been able to get the time to follow-up on this. She continues to have a fair amount of drainage out of these wounds with certainly precludes leaving dressings on all  week 10/13/16; patient has not seen Dr. Marla Roe since she was last in our clinic. I'm not really sure what she is doing with the wounds, we did try to get her [facility] and I think she is actually using this most of the time. Because of drainage she states she has to change this every second day although this is an improvement from what she used to do. She went to see Dr. Doran Durand who did not think she had a muscular issue with regards to her feet, he referred her to a neurologist and I think the appointment is sometime in June. I changed her back to Iodoflex which she has used in the past but not recently. 11/03/16; the patient has been using Iodoflex although she ran out of this. Still claims that there is a lot of drainage although the wound does not look like this. No surrounding erythema. She has not been back to see Dr. Marla Roe 11/24/16; the patient has been using Iodoflex again but she ran out of it 2 or 3 days ago. There is no major change in the condition of either one of these wounds in fact they are larger and covered in a thick adherent surface slough/nonviable tissue especially on the left. She does not tolerate mechanical debridement in our clinic. Going back to see Dr. Marla Roe of plastic surgery for an operative debridement would seem reasonable. 12/15/16; the patient has not been back to see Dr. Marla Roe. She is been dealing with a series of illnesses and her husband which of monopolized her time. She is been using Sorbact which we largely supplied. She states the drainage is bad enough that it maximum she can go 2-3 days without changing the dressing 01/12/2017 -- the patient has not been back for about 4 weeks and has not seen Dr. Marla Roe not does she have any appointment pending. 01/23/17; patient has not seen Dr. Marla Roe even though I suggested this previously. She is using Santyl that was suggested last week by Dr. Con Memos this Cost her $16 through her insurance which  is indeed surprising 02/12/17; continuing Santyl and the patient is changing this daily. A lot of drainage. She has not been back to see plastic surgery she is using an Ace wrap. Our intake nurse suggested wrap around stockings which would make a good reasonable alternative 02/26/17; patient is been using Santyl and changing this daily due to drainage. She has not been to see plastic surgery she uses in April Ace wrap to control the edema. She did obtain extremitease stockings but stated that the edema in her leg was to big for these 03/20/17; patient is using Santyl and Anasept. Surfaces looked better today the area on the right is actually measuring a little smaller. She has states she has a lot of  pain in her feet and ankles and is asking for a consult to pain control which I'll try to help her with through our case manager. 04/10/17; the patient arrives with better-looking wound surfaces and is slightly smaller wound on the left she is using a combination of Santyl and Anasept. She has an appointment or at least as started in the pain control center associated with [facility] 05/14/17; this is a patient who I followed for a prolonged period of time. She has venous insufficiency ulcers on her bilateral medial ankles. At one point I had this down to a much smaller wound on the left however these reopened and we've never been able to get these to heal. She has been using Santyl and Anasept gel although 2 weeks ago she ran out of the Anasept gel. She has a stable appearance of the wound. She is going to the wound care clinic at [facility]. They wanted do a nerve block/spinal block although she tells me she is reluctant to go forward with that. 05/21/17; this is a patient I have followed for many years. She has venous insufficiency ulcers on her bilateral medial ankles. Chronic pain and deformity in her ankles as well. She is been to see plastic surgery as well as orthopedics. Using PolyMem  AG most recently/Kerramax/ABDs and 2 layer compression. She has managed to keep this on and she is coming in for a nurse check to change the dressing on Tuesdays, we see her on Fridays 06/05/17; really quite a good looking surface and the area especially on the right medial has contracted in terms of dimensions. Well granulated healthy-looking tissue on both sides. Even with an open curet there is nothing that even feels abnormal here. This is as good as I've seen this in quite some time. We have been using PolyMem AG and bringing her in for a nurse check 06/12/17; really quite good surface on both of these wounds. The right medial has contracted a bit left is not. We've been using PolyMem and AG and she is coming in for a nurse visit 06/19/17; we have been using PolyMem AG and bringing her in for a nurse check. Dimensions of her wounds are not better but the surfaces looked better bilaterally. She complained of bleeding last night and the left wound and increasing pain bilaterally. She states her wound pain is more neuropathic than just the wounds. There was some suggestion that this was radicular from her pain management doctor in talking to her it is really difficult to sort this out. 06/26/17; using PolyMem and AG and bringing her in for a nurse check as All of this and reasonably stable condition. Certainly not improved. The dimensions on the lateral part of the right leg look better but not really measuring better. The medial aspect on the left is about the same. 07/03/16; we have been using PolyMen AG and bringing her in for a nurse check to change the dressings as the wounds have drainage which precludes once weekly changing. We are using all secondary absorptive dressings.our intake nurse is brought up the idea of using a wound VAC/snap VAC on the wound to help with the drainage to see if this would result in some contraction. This is not a bad idea. The area on the right medial is actually looking  smaller. Both wounds have a reasonable-looking surface. There is no evidence of cellulitis. The edema is well controlled 07/10/17; the patient was denied for a snap VAC by her insurance.  The major issue with these wounds continues to be drainage. We are using wicked PolyMem AG and she is coming in for a nurse visit to change this. The wounds are stable to slightly improved. The surface looks vibrant and the area on the right certainly has shrunk in size but very slowly 07/17/17; the patient still has large wounds on her bilateral medial malleoli. Surface of both of these wounds looks better. The dimensions seem to come and go but no consistent improvement. There is no epithelialization. We do not have options for advanced treatment products due to insurance issues. They did not approve of the wound VAC to help control the drainage. More recently we've been using PolyMem and AG wicked to allow drainage through. We have been bringing her in for a nurse visit to change this. We do not have a lot of options for wound care products and the home again due to insurance issues 07/24/17; the patient's wound actually looks somewhat better today. No drainage measurements are smaller still healthy-looking surface. We used silver collagen under PolyMen started last week. We have been bringing her in for a dressing change 07/31/17; patient's wound surface continued to look better and I think there is visible change in the dimensions of the wound on the right. Rims of epithelialization. We have been using silver collagen under PolyMen and bringing her in for a dressing change. There appears to be less drainage although she is still in need of the dressing change 08/07/17. Patient's wound surface continues to look better on both sides and the area on the right is definitely smaller. We have been using silver collagen and PolyMen. She feels that the drainage has been it has been better. I asked her about her vascular status.  She went to see Dr. Aleda Grana at [facility] and had some form of ablation. I don't have much detail on this. I haven't my notes from 2016 that she was not a candidate for any further ablation but I don't have any more information on this. We had referred her to vein and vascular I don't think she ever went. He does not have a history of PAD although I don't have any information on this either. We don't even have ABIs in our record 08/14/17; we've been using silver collagen and PolyMen cover. And putting the patient and compression. She we are bringing her in as a nurse visit to change this because ofarge amount of drainage. We didn't the ABIs in clinic today since they had been done in many moons 1.2 bilaterally. She has been to see vein and vascular however this was at [facility] and she had ablation although I really don't have any information on this all seemed biking get a report. She is also been operatively debrided by plastic surgery and had a cell placed probably 8-12 months ago. This didn't have a major effect. We've been making some gains with current dressings 08/19/17-She is here in follow-up evaluation for bilateral medial malleoli ulcers. She continues to tolerate debridement very poorly. We will continue with recently changed topical treatment; if no significant improvement may consider switching to Iodosorb/Iodoflex. 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 She has been using silver collagen and PolyMem. I believe she has been used and tried on Iodoflex before. During her trip to the clinic we've been watching her wound with Anasept spray and I would like to encourage this on thenurse visit days 09/04/17 bilateral medial malleoli ulcers  area is her chronic related to chronic venous insufficiency. These have been very refractory over time. We have been using silver collagen and PolyMen. She is coming in once a week for a doctor's and  once a week for nurse visits. We are actually making some progress 09/18/17; the patient's wounds are smaller especially on the right medial. She arrives today to upset to consider even washing these off with Anasept which I think is been part of the reason this is been closing. We've been using collagen covered in PolyMen otherwise. It is noted that she has a small area of folliculitis on the right medial calf that. As we are wrapping her legs I'll give her a short course of doxycycline to make sure this doesn't amount to anything. She is a long list of complaints today including imbalance, shortness of breath on exertion, inversion of her left ankle. With regards to the latter complaints she is been to see orthopedics and they offered her a tendon release surgery I believe but wanted her wounds to be closed first. I have recommended she go see her primary physician with regards to everything else. 09/25/17; patient's wounds are about the same size. We have made some progress bilaterally although not in recent weeks. She will not allow me T wash these wounds with Anasept even if she is doing her cell. Wheeze we've been using collagen covered in PolyMen. Last week she had a small area of folliculitis this is now opened into a small wound. She completed 5 days of trimethoprim sulfamethoxazole 10/02/17; unfortunately the area on her left medial ankle is worse with a larger wound area towards the Achilles. The patient complains of a lot of pain. She will not allow debridement although visually I don't think there is anything to debridement in any case. We have been using silver collagen and PolyMen for several months now. Initially we are making some progress although I'm not really seeing that today. We will move back to [facility]. His admittedly this is a bit of a repeat however I'm hoping that his situation is different now. The patient tells me she had her leg on the left give out on her yesterday  this is process some pain. 10/09/17; the patient is seen twice a week largely because of drainage issues coming out of the chronic medial bimalleolar wounds that are chronic. Last week the dimensions of the one on the left looks a little larger I changed her to [facility]. She comes in today with a history of terrible pain in the bilateral wound areas. She will not allow debridement. She will not even allow a tissue culture. There is no surrounding erythema no no evidence of cellulitis. We have been putting her Kerlix Coban man. She will not allow more aggressive compression as there was a suggestion to put her in 3 layer wraps. 10/16/17; large wounds on her bilateral medial malleoli. These are chronic. Not much change from last week. The surface looks have healthy but absolutely no epithelialization. A lot of pain little less so of drainage. She will not allow debridement or even washing these off in the vigorous fashion with Anasept. 10/23/17; large wounds on her bilateral malleoli which are chronic. Some improvement in terms of size perhaps on the right since last time I saw these. She states that after we increased the 3 layer compression there was some bleeding, when she came in for a nurse visit she did not want 3 layer compression put back on  about our nurse managed to convince her. She has known chronic venous visit issues and I'm hoping to get her to tolerate the 3 layer compression. using Hydrofera Blue 10/30/17; absolutely no change in the condition of either wound although we've had some improvement in dimensions on the right.. Attempted to put her in 3 layer compression she didn't tolerated she is back in 2 layer compression. We've been using Hydrofera Blue We looked over her past records. She had venous reflux studies in November 2016. There was no evidence of deep venous reflux on the right. Superficial vein did not show the greater saphenous vein at think this is been previously ablated  the small saphenous vein was within normal limits. The left deep venous system showed no DVT the vessels were positive for deep venous reflux in the posterior tibial veins at the ankle. The greater saphenous vein was surgically absent small saphenous vein was within normal limits. She went to vein and vascular at [facility]. I believe she had an ablation on the left greater saphenous vein. I'll update her reflux studies perhaps ever reviewed by vein and vascular. We've made absolutely no progress in these wounds. Will also try to read and TheraSkins through her insurance 11/06/17; W the patient apparently has a 2 week follow-up with vein and vascular I like him to review the whole issue with regards to her previous vascular workup by Dr. Aleda Grana. We've really made no progress on these wounds in many months. She arrives today with less viable looking surface on the left medial ankle wound. This was apparently looking about the same on Tuesday when she was here for nurse visit. 11/13/17; deep tissue culture I did last time of the left lower leg showed multiple organisms without any predominating. In particular no Staphylococcus or group A strep were isolated. We sent her for venous reflux studies. She's had a previous left greater saphenous vein stripping and I think sclerotherapy of the right greater saphenous vein. She didn't really look at the lesser saphenous vein this both wounds are on the medial aspect. She has reflux in the common femoral vein and popliteal vein and an accessory vein on the right and the common femoral vein and popliteal vein on the left. I'm going to have her go to see vein and vascular just the look over things and see if anything else beside aggressive compression is indicated here. We have not been able to make any progress on these wounds in spite of the fact that the surface of the wounds is never look too bad. 11/20/17; no major change in the condition of the wounds.  Patient reports a large amount of drainage. She has a lot of complaints of pain although enlisting her today I wonder if some of this at least his neuropathic rather than secondary to her wounds. She has an appointment with vein and vascular on 12/30/17. The refractory nature of these wounds in my mind at least need vein and vascular to look over the wounds the recent reflux studies we did and her history to see if anything further can be done here. I also note her gait is deteriorated quite a bit. 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 She has a bilateral Trendelenburg gait. I wonder if this is neuropathic or perhaps multilevel radicular. 11/27/17; her wounds actually looks slightly better. Healthy-looking granulation tissue a scant amount of epithelialization. [place] [facility] will not pay for [employer]. They will play for tri layer Oasis  and Dermagraft. This is not a diabetic ulcer. We'll try for the tri layer Oasis. She still complains of some drainage. She has a vein and vascular appointment on 12/30/17 12/04/17; the wounds visually look quite good. Healthy-looking granulation with some degree of epithelialization. We are still waiting for response to our request for trial to try layer Oasis. Her appointment with vascular to review venous and arterial issues isn't sold the end of July 7/31. Not allow debridement or even vigorous cleansing of the wound surface. 12/18/17; slightly smaller especially on the right. 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 We've been using Hydrofera Blue. We still are looking into triple layer Oasis through her insurance 01/08/18 on evaluation today patient's wound actually appears to be showing signs of good improvement at this point in time. She has been tolerating the dressing changes without complication. Fortunately there does not appear to be any evidence of infection at this point in time. We have been utilizing  silver nitrate which does seem to be of benefit for her which is also good news. Overall I'm very happy with how things seem to be both regards appearance as well as measurement. Patient did see Dr. Bridgett Larsson for evaluation on 12/30/17. In his assessment he felt that stripping would not likely add much more than chronic compression to the patient's healing process. His recommendation was to follow-up in three months with Dr. Doren Custard if she hasn't healed in order to consider referral back to you and see vascular where she previously was in a trial and was able to get her wound to heal. I'll be see what she feels she when you staying compression and he reiterated this as well. 01/13/18 on evaluation today patient appears to actually be doing very well in regard to her bilateral medial malleolus ulcers. She seems to have tolerated the chemical cauterization with silver nitrate last week she did have some pain through that evening but fortunately states that I'll be see since it seems to be doing better she is overall pleased with the progress. 01/21/18; really quite a remarkable improvement since I've last seen these wounds. We started using silver nitrate specially on the islands of hyper granulation which for some reason her around the wound circumference. This is really done quite nicely. Primary dressing Hydrofera Blue under 4 layer compression. She seems to be able to hold out without a nurse rewrap. Follow-up in 1 week 01/28/18; we've continued the hydrofera blue but continued with chemical cauterization to the wound area that we started about a month ago for irregular hyper granulation. She is made almost stunning improvement in the overall wound dimensions. I was not really expecting this degree of improvement in these chronic wounds 02/05/18; we continue with Hydrofera Bluebut of also continued the aggressive chemical cauterization with silver nitrate. We made nice progress with the right greater than left  wound. 02/12/18. We continued with Hydrofera Blue after aggressive chemical cauterization with silver nitrate. We appear to be making nice progress with both wound areas 02/19/2018; we continue with [facility] after washing the wounds vigorously with Anasept spray and chemical cauterization with silver nitrate. We are making excellent progress. The area on the right's just about closed 02/26/2018. The area on the left medial ankle had too much necrotic debris today. I used a #5 curette we are able to get most of the soft. I continued with the silver nitrate to the much smaller wound on the right medial ankle she had a new area on  her right lower pretibial area which she says was due to a role in her compression 03/05/2018; both wound areas look healthy. Not much change in dimensions from last week. I continue to use silver nitrate and Hydrofera Blue. The patient saw Dr. Doren Custard of vein and vascular. He felt she had venous stasis ulcers. He felt based on her previous arteriogram she should have adequate circulation for healing. Also she has deep venous reflux but really no significant correctable superficial venous reflux at this time. He felt we should continue with conservative management including leg elevation and compression 04/02/2018; since we last saw this woman about a month ago she had a fall apparently suffered a pelvic fracture. I did not look up the x-ray. Nevertheless because of pain she literally was bedbound for 2 weeks and had home health coming out to change the dressing. Somewhat predictably this is resulted in considerable improvement in both wound areas. The right is just about closed on the medial malleolus and the left is about half the size. 04/16/2018; both her wounds continue to go down in size. Using Hydrofera Blue. 05/07/18; both her wounds appeared to be improving especially on the right where it is almost closed. We are using Hydrofera Blue 05/14/2018; slightly worse this  week with larger wounds. Surface on the left medial not quite as good. We have been using Hydrofera Blue 05/21/18; again the wounds are slightly larger. Left medial malleolus slightly larger with eschar around the circumference. We have been using Hydrofera Blue undergoing a wraps for a prolonged period of time. This got a lot better when she was more recumbent due to a fall and a back injury. I change the primary dressing the silver alginate today. She did not tolerate a 4 layer compression previously although I may need to bring this up with her next time 05/28/2018; area on the left medial malleolus again is slightly larger with more drainage. Area on the right is roughly unchanged. She has a small area of folliculitis on the right medial just on the lower calf. This does not look ominous. 06/03/2018 left medial malleolus slightly smaller in a better looking surface. We used silver nitrate on this last time with silver alginate. The area on the right appears slightly smaller 1/10; left medial malleolus slightly smaller. Small open area on the right. We used silver nitrate and silver alginate as of 2 weeks ago. We continue with the wound and compression. These got a lot better when she was off her feet 1/17; right medial malleolus wound is smaller. The left may be slightly smaller. Both surfaces look somewhat better. 1/24; both wounds are slightly smaller. Using silver alginate under Unna boots 1/31; both wounds appear smaller in fact the area on the right medial is just about closed. Surface eschar. We have been using silver alginate under Unna boots. The patient is less active now spends let much less time on her feet and I think this is contributed to the general improvement in the wound condition 2/7; both wounds appear smaller. I was hopeful the right medial would be closed however there there is still the same small open area. Slight amount of surface eschar on the left the dimensions are  smaller there is eschar but the wound edges appear to be free. We have been using silver alginate under Unna boot's 2/14; both wounds once again measure smaller. Circumferential eschar on the left medial. We have been using silver alginate under Unna boots with gradual improvement 2/21; the  area on the right medial malleolus has healed. The area on the left is smaller. We have been using silver alginate and Unna boots. We can discharge wrapping the right leg she has 20/30 stockings at home she will need to protect the scar tissue in this area 2/28; the area on the right medial malleolus remains closed the patient has a compression stocking. The area on the left is smaller. We have been using silver alginate and Unna boots. 3/6 the area on the right medial ankle remains closed. Good edema control noted she is using her own compression stocking. The area on the left medial ankle is smaller. We have been managing this with silver alginate and Unna boots which we will continue today. 3/13; the area on the right medial ankle remains closed and I'm declaring it healed today. When necessary the left is about the same still a healthy-looking surface but no major change and wound area. No evidence of infection and using silver alginate under unna and generally making considerable improvement 3/27 the area on the right medial ankle remains closed the area on the left is about the same as last week. Certainly not any worse we have been using silver alginate under an Unna boot 4/3; the area on the right medial ankle remains closed per the patient. We did not look at this wound. The wound on the left medial ankle is about the same surface looks healthy we have been using silver alginate under an Unna boot 4/10; area on the right medial ankle remains closed per the patient. We did not look at this wound. The wound on the left medial ankle is slightly larger. The patient complains that the [facility] caused  burning pain all week. She also told us that she was a lot more active this week. Changed her back to silver alginate 4/17; right medial ankle still closed per the patient. Left medial ankle is slightly larger. Using silver alginate. She did not tolerate Hydrofera Blue on this area 4/24; right medial ankle remains closed we have not look at this. The left medial ankle continues to get larger today by about a centimeter. We have been using silver alginate under Unna boots. She complains about 4 layer compression as an alternative. She has been up on her feet working on her garden 5/8; right medial ankle remains closed we did not look at this. The left medial ankle has increased in size about 100%. We have been using silver alginate under Unna boots. She noted increased pain this week and was not surprised that the wound is deteriorated 5/15; no major change in SA however much less erythema ( one week of doxy ocellulitis). 5/22-65 year old female returns at 1 week to the clinic for left medial ankle wound for which we have been using silver alginate under 3 layer compression She was placed on DOXY at last visit - the wound is wider at this visit. She is in 3 layer compression 5/29; change to [facility] last week. I had given her empiric doxycycline 2 weeks ago for a week. She is in 3 layer compression. She complains of a lot of pain and drainage on presentation today. 6/5; using Hydrofera Blue. I gave her doxycycline recently empirically for erythema and pain around the wound. Believe her cultures showed enterococcus which not would not have been well covered by doxycycline nevertheless the wound looks better and I don't feel specifically that the enterococcus needs to be covered. She has a new what looks  like a wrap injury on her lateral left ankle. 6/12; she is using Hydrofera Blue. She has a new area on the left anterior lower tibial area. This was a wrap injury last week. 6/19; the patient is  using Hydrofera Blue. She arrived with marked inflammation and erythema around the wound and tenderness. 12/01/18 on evaluation today patient appears to be doing a little bit better based on what I'm hearing from the standpoint of lassos evaluation to this as far as the overall appearance of the wound is concerned. Then sometime substandard she typically sees Dr. Dellia Nims. Nonetheless overall very pleased with the progress that she's made up to this point. No fevers, chills, nausea, or vomiting noted at this time. 7/10; some improvement in the surface area. Aggressively debrided last week apparently. I went ahead with the debridement today although the patient does not tolerate this very well. We have been using Iodoflex. Still a fair amount of drainage 7/17; slightly smaller. Using Iodoflex. 7/24; no change from last week in terms of surface area. We have been using Iodoflex. Surface looks and continues to look somewhat better 7/31; surface area slightly smaller better looking surface. We have been using Iodoflex. This is under Unna boot compression 8/7-Patient presents at 1 week with Unna boot and Iodoflex, wound appears better 8/14-Patient presents at 1 week with Iodoflex, we use the Unna boot, wound appears to be stable better.Patient is getting Botox treatment for the inversion of the foot for tendon release, Next week 8/21; we are using Iodoflex. Unna boot. The wound is stable in terms of surface area. Under illumination there is some areas of the wound that appear to be either epithelialized or perhaps this is adherent slough at this point I was not really clear. It did not wipe off and I was reluctant to debride this today. 8/28; we are using Iodoflex in an Unna boot. Seems to be making good improvement. 9/4; using Iodoflex and wound is slightly smaller. 9/18; we are using Iodoflex with topical silver nitrate when she is here. The wound continues to be smaller 10/2; patient missed her  appointment last week due to GI issues. She left and Iodoflex based dressing on for 2 weeks. Wound is about the same size about the size of a dime on the left medial lower 10/9 we have been using Iodoflex on the medial left ankle wound. She has a new superficial probable wrap injury on the dorsal left ankle 10/16; we have been using Hydrofera Blue since last week. This is on the left medial ankle 10/23; we have been using Hydrofera Blue since 2 weeks ago. This is on the left medial ankle. Dimensions are better 11/6; using Hydrofera Blue. I think the wound is smaller but still not closed. Left medial ankle 11/13; we have been using Hydrofera Blue. Wound is certainly no smaller this week. Also the surface not as good. This is the remanent of a very large area on her left medial ankle. 11/20; using Sorbact since last week. Wound was about the same in terms of size although I was disappointed about the surface debris 12/11; 3-week follow-up. Patient was on vacation. Wound is measuring slightly larger we have been using Sorbact. 12/18; wound is about the same size however surface looks better last week after debridement. We have been using Sorbact under compression 1/15 wound is probably twice the size of last time increased in length nonviable surface. We have been using Sorbact. She was running a mild fever and missed her  appointment last week 1/22; the wound is come down in size but under illumination still a very adherent debris we have been Hydrofera Blue that I changed her to last week 1/29; dimensions down slightly. We have been using Hydrofera Blue 2/19 dimensions are the same however there is rims of epithelialization under illumination. Therefore more the surface area may be epithelialized 2/26; the patient's wound actually measures smaller. The wound looks healthy. We have been using Hydrofera Blue. I had some thoughts about running Apligraf then I still may do that however this looks so much  better this week we will delay that for now 3/5; the wound is small but about the same as last week. We have been using Hydrofera Blue. No debridement is required today. 3/19; the wound is about the size of a dime. Healthy looking wound even under illumination. We have been using Hydrofera Blue. No mechanical debridement is necessary 3/26; not much change from last week although still looks very healthy. We have been using Hydrofera Blue under Unna boots Patient was offered an ankle fusion by podiatry but not until the wound heals with a proceed with this. 4/9; the patient comes in today with her original wound on the medial ankle looking satisfactory however she has some uncontrolled swelling in the middle part of her leg with 2 new open areas superiorly just lateral to the tibia. I think this was probably a wrap issue. She said she felt uncomfortable during the week but did not call in. We have been using Hydrofera Blue 4/16; the wound on the medial ankle is about the same. She has innumerable small areas superior to this across her mid tibia. I think this is probably folliculitis. She is also been working in the yard doing a lot of sweating 4/30; the patient issue on the upper areas across her mid tibia of all healed. I think this was excessive yard work if I remember. Her wound on the medial ankle is smaller. Some debris on this we have been using Hydrofera Blue under Unna boots 5/7; mid tibia. She has been using Hydrofera Blue under an Unna wrap. She is apparently going for her ankle surgery on June 3 10/28/19-Patient returns to clinic with the ankle wound, we are using Hydrofera Blue under Unna wrap, surgery is scheduled for her left foot for June 3 so she will be back for nurse visit next week READMISSION 01/17/2020 Mrs. Ebron is a 65 year old woman we have had in this clinic for a long period of time with severe venous hypertension and refractory wounds on her medial lower legs and ankles  bilaterally. 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 When she was off her legs for a prolonged period example when she fell and suffered a compression fracture things would heal up quite nicely. She is now retired and we managed to heal up the right medial leg wound. The left one was very tiny last time I saw this although still refractory. 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 It got to the point where this was interfering with her walking and she elected to proceed with a ankle arthrodesis to straighten her her ankle and leave her with a functional outcome for mobilization. The patient was referred to Dr. Doren Custard and really this took  some time to arrange. Dr. Doren Custard saw her on 12/07/2019. Once again he verified that she had no arterial issues. She had previously had an angiogram several years ago. Follow-up ABIs on the left showed an ABI of 1.12 with triphasic waveforms and a TBI of 0.92. She is felt to have chronic deep venous insufficiency but I do not think it was felt that anything could be done from about this from an ablation point of view. At the time Dr. Doren Custard saw this patient the wounds actually look closed via the pictures in his clinic. The patient finally underwent her surgery on 12/15/2019. This went reasonably well and there was a good anatomic outcome. She developed a small distal wound dehiscence on the lateral part of the surgical wound. However more problematically she is developed recurrence of the wound on the medial left ankle. There are actually 2 wounds here one in the distal lower leg and 1 pretty much at the level of the medial malleolus. It is a more distal area that is more problematic. She has been using Hydrofera Blue which started on Friday before this she was  simply Ace wrapping. There was a culture done that showed Pseudomonas and she is on ciprofloxacin. A recent CNS on 8/11 was negative. The patient reports some pain but I generally think this is improving. She is using a cam boot completely nonweightbearing using a walker for pivot transfers and a wheelchair 8/24; not much improvement unfortunately she has a surgical wound on the lateral part in the venous insufficiency wound medially. The bottom part of the medial insufficiency wound is still necrotic there is exposed tendon here. We have been using Hydrofera Blue under compression. Her edema control is however better 8/31; patient in for follow-up of his surgical wound on the lateral part of her left leg and chronic venous insufficiency ulcers medially. We put her back in compression last week. She comes in today with a complaint of 3 or 4 days worth of increasing pain. She felt her cam walker was rubbing on the area on the back of her heel. However there is intense erythema seems more likely she has cellulitis. She had 2 cultures done when she was seeing podiatry in the postop. One of them in late July showed Pseudomonas and she received a course of ciprofloxacin the other was negative on 8/11 she is allergic to penicillin with anaphylactoid complaints of hives oral swelling via information in epic 9/9; when I saw this patient last week she had intense anterior erythema around her wound on the right lateral heel and ankle and also into the right medial heel. Some of this was no doubt drainage and her walker boot however I was convinced she had cellulitis. I gave her Levaquin and Bactrim she is finishing up on this now. She is following up with Dr. [name] he saw her yesterday. He is taken her out of the walking boot of course she is still nonweightbearing. Her x-ray was negative for any worrisome features such as soft tissue air etc. Things are a lot better this week. She has home health. We have been  using Hydrofera Blue under an [employer] which she put back on yesterday. I did not wrap her last week 9/17; her surrounding skin looks a lot better. In fact the area on the left lateral ankle has just a scant amount of eschar. The only remaining wound is the large area on the left medial ankle. Probably about 60% of this is healthy  granulation at the surface however she has a significant divot distally. This has adherent debris in it. I been using debridement and silver collagen to try and get this area to fill-in although I do not think we have made much progress this week 9/24; the patient's wound on the left medial ankle looks a lot better. The deeper divot area distally still requires debridement but this is cleaning up quite nicely we have been using silver collagen. The patient is complaining of swelling in her foot and is worried that that is contributing to the nonhealing of the ankle wound. She is also complaining of numbness in her anterior toes 10/4; left medial ankle. The small area distally still has a divot with necrotic material that I have been debriding away. This has an undermining area. She is approved for Apligraf. She saw Dr. [name] her surgeon on 10/1. I think he declared himself is satisfied with the condition of things. Still nonweightbearing till the end of the month. 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 Her surgical wound is well closed. There is no evidence of infection 10/11; the patient arrived in clinic today with the expectation that we be able to put an Apligraf on this area after debridement however she arrives with a relatively large amount of green drainage on the dressing. The patient states that this started on Friday. She has not been systemically unwell. 10/19; culture I did last week showed both Enterococcus and Pseudomonas. I think this came in separate parts because I stopped her ciprofloxacin I gave her and prescribed her  linezolid however now looking at the final culture result this is Pseudomonas which is resistant to quinolones. She has not yet picked up the linezolid apparently phone issues. We are also trying to get a topical antibiotic out of [place] in [place] they can be applied by home health. She is still having green drainage 10/16; the patient has her topical antibiotic from [facility] in [place]. This is a compounded gel with vancomycin and ciprofloxacin and gentamicin. We are applying this on the wound bed with silver alginate over the top with Unna boot wraps. She arrives in clinic today with a lot less ominous looking drainage although she is only use this topical preparation once the second time today. She sees Dr. [name] her surgeon on Friday she has home health changing the dressing 11/2; still using her compounded topical antibiotic under silver alginate. Surface is cleaning up there is less drainage. We had an Apligraf for her today and I elected to apply it. A light coating of her antibiotic 04/25/2020 upon evaluation today patient appears to be doing well with regard to her ankle ulcer. There is a little bit of slough noted on the surface of the wound I am can have to perform sharp debridement to clear this away today. With that being said other than that fact overall I feel like she is making progress and we do see some new epithelial growth. There is also some improvement in the depth of the wound and that distal portion. There is little bit of slough there as well. 12/7; 2-week follow-up. Apligraf #3. Dimensions are smaller. Closing in especially inferiorly. Still some surface debris. Still using the [facility] topical antibiotic but I told her that I don't think this needs to be renewed 12/21; 2-week follow-up. Apligraf #4 dimensions are smaller. Nice improvement 06/05/2020; 2-week follow-up. The patient's wound on the left medial ankle looks really excellent. Nice  granulation.  Advancing epithelialization no undermining no evidence of infection. We would have to reapply for another Apligraf but with the condition of this wound I did not feel strongly about it. We used Hydrofera Blue under the same degree of compression. She follows up with Dr. [name] her surgeon a week Friday 06/13/2020 upon evaluation today patient appears to be doing excellent in regard to her wound. She has been tolerating the dressing changes without complication. Fortunately there is no signs of active infection at this time. No fevers, chills, nausea, vomiting, or diarrhea. She was using Hydrofera Blue last week. 06/20/2020 06/20/2020 on evaluation today patient actually appears to be doing excellent in regard to her wound. This is measuring better and looking much better as well. She has been using the collagen that seems to be doing better for her as well even though the [facility] was and is not sticking or feeling as rough on her wound. She did see Dr. [name] on Friday he is very pleased he also stated none of the hardware has shifted. That is great news 1/27; the patient has a small clean wound all that is remaining. I agree that this is too small to really consider an Apligraf. Under illumination the surface is looking quite good. We have been using collagen although the dimensions are not any better this week 2/2; the patient has a small clean wound on the left medial ankle. Although this left of her substantial original areas. Measurements are smaller. We have been using polymen Ag under an Haematologist. 2/10; small area on the left medial ankle. This looks clean nothing to debride however dimensions are about the same we have been using polymen I think now for 2 weeks 2/17; not much change in surface area. We have been using polymen Ag without any improvement. 3/17; 1 month follow-up. The patient has been using endoform without any improvement in fact I think this looks worse with more depth and  more expansion 3/24; no improvement. Perhaps less debris on the surface. We have been using Sorbact for 1 week 4/4; wound measures larger. She has edema in her leg and her foot which she tells as her wrap came down. We have been using Unna boots. Sorbact of the wound. She has been approved for Apligraf 09/12/2020 upon evaluation today patient appears to be doing well with regard to her wound. We did get the Apligraf reapproved which is great news we have that available for application today. Fortunately there is no signs of infection and overall the patient seems to be doing great. The wound bed is nice and clean. 4/27; patient presents for her second application of Apligraf. She states over the past week she has been on her feet more often due to being outside in her garden. She has noted more swelling to her foot as a result. She denies increased warmth, pain or erythema to the wound site. 10/10/2020 upon evaluation today patient appears to be doing well with regard to her wound which does not appear to be quite as irritated as last week from what I am hearing. With that being said unfortunately she is having issues with some erythema and warmth to touch as well as an increase size. I do believe this likely is infected. 10/17/2020 upon evaluation today patient appears to be doing excellent in regard to her wound this is significantly improved as compared to last week. Fortunately I think that the infection is much better controlled at this point. She  did have evidence of both Enterococcus as well as Staphylococcus noted on culture. Enterococcus really would not be helped significantly by the Cipro but the wound is doing so much better I am under the assumption that the Staphylococcus is probably the main organism that is causing the current infection. Nonetheless I think that she is doing excellent as far as that is concerned and I am very pleased in that regard. I would therefore recommend she  continue with the Cipro. 10/31/2020 upon evaluation today patient appears to be doing well with regard to her wound. She has been tolerating the dressing changes without complication. Fortunately there is no signs of active infection and overall I am extremely pleased with where things stand today. No fevers, chills, nausea, vomiting, or diarrhea. With that being said she does have some green drainage coming from the wound and although it looks okay I am a little concerned about the possibility of a continuing infection. Specifically with Pseudomonas. For that reason I will go ahead and send in a prescription for Cipro for her to be continued. 11/14/2020 upon evaluation today patient appears to be doing very well currently in regard to her wound on her leg. She has been tolerating the dressing changes without complication. Fortunately I feel like the infection is finally under good control here. Unfortunately we do not have the Apligraf for application today although we can definitely order to have it in place for next week. That will be her fifth and final of the current series. Nonetheless I feel like her wound is really doing quite well which is great news. 11/21/2020 upon evaluation today patient appears to be doing well with regard to her wound on the medial ankle. Fortunately I think the infection is under control and I do believe we can go ahead and reapply the Apligraf today. She is in agreement with that plan. There does not appear to be any signs of active infection at this time which is great news. No fevers, chills, nausea, vomiting, or diarrhea. 12/05/2020 upon evaluation today patient's wound bed actually showed signs of good granulation epithelization at this point. There does not appear to be any signs of infection which is great news and overall very pleased with where things stand. Overall the patient seems to be doing fairly well in my opinion with regard to her wound although I do believe  she continues to build up a lot of biofilm I think she could benefit from using PuraPly at this point. 12/12/2020 upon evaluation today patient's wound actually appears to be doing decently well today. The Unna boot has not been quite as well-tolerated so that more uncomfortable for her and even causing some pressure over the plate on the lateral portion of her foot which is 90 where the wound is. There did not appear to be any significant deep tissue injury with that there may be a minimal change in the skin noted I think that we may want to go back to the Coflex 2 layer which is a little bit easier on her skin it seems. 12/19/2020 upon evaluation today patient actually seems to be making great progress with the PuraPly currently. She in fact seems to be much better as far as the overall appearance of the wound bed is concerned I am very happy in this regard. I do not see any signs of of infection which is great news as well. No fevers, chills, nausea, vomiting, or diarrhea. 12/26/2020 upon evaluation today patient appears to actually  be doing better in regard to her wound on the left medial ankle region. The surface of the wound is actually doing significantly better which is great news. There does not appear to be any signs of infection which is also great news and in general I am extremely pleased with where we stand today. 01/02/2021 upon evaluation today patient appears to be doing well with regard to her wound. In fact this is showing signs of excellent improvement and very pleased with where things stand. In fact the last 3 appointments have all shown signs of this getting smaller which is excellent news. I have not even had to perform any debridement and today is no exception. Overall I feel like this is dramatically improved compared to previous. T oday is PuraPly application #4. 9/73/5329 upon evaluation today patient appears to be doing excellent in regard to her wound this is continue to show  signs of improvement and overall I am extremely pleased with where we stand today. She is actually here for PuraPly application #5. Every time we have applied this she is noted definite improvement on measurements. 01/23/2021 upon evaluation today patient is actually making good progress in regard to her wound. This was actually on just a little bit longer this time compared to previous due to the fact that she did have to go out of town. She is actually here for PuraPly application #6. We have definitely been seeing improvements in the overall quality of the tissue on the surface of the wound which is awesome news. In general I think that the patient seems to be continuing to make great progress here. 01/30/2021 upon evaluation today patient's wound is actually doing excellent. There is really not any significant biofilm buildup which is great news and overall I am extremely pleased with where things stand today. There does not appear to be any signs of active infection. No fevers, chills, nausea, vomiting, or diarrhea. 02/06/2021 upon evaluation today patient's wound is actually showing signs of excellent improvement which is great news. We continue to see the benefit of the PuraPly this is doing a great job the wound seems not really irritated whatsoever and is showing signs of good granulation at this point. Overall I am extremely happy with what we are seeing. The patient likewise is happy to hear all of this as well. 02/13/2021 upon evaluation today patient appears to be doing well with regard to her wound. This again is measuring smaller today and I am very pleased in this regard. Fortunately there does not appear to be any signs of active infection at this time which is good news from a systemic standpoint. Locally there is still some significant drainage which she does have is concerned about infection locally. No fevers, chills, nausea, vomiting, or diarrhea. 02/20/2021 upon evaluation today  patient actually appears to be doing decently well in regard to her wound. She has been tolerating the dressing changes without complication. I do believe the PuraPly is helping wound bed does appear to be doing better. There is no evidence of active infection locally or systemically at this point visually although on fluorescence imaging there still appear to be bacterial load and bioburden noted. 02/27/2021 upon evaluation today patient fortunately continues to show signs of improvement with use of the PuraPly currently. Subsequently we did review her culture results and to be honest I think that the Bactrim is probably the best option to have her continue at this point. For that reason I am get a  go ahead and send in a refill today for her for an additional 2 weeks. Nonetheless I think that we are making excellent progress here. It was Enterococcus and Staphylococcus that were noted she is allergic to penicillin so there is not much I can do from the Enterococcus standpoint though the staff does seem to be sensitive to the Bactrim. 03/06/2021 upon evaluation today patient appears to be doing well with regard to her wound. This is definitely showing signs of improvement which is great news and overall I am extremely pleased with where things stand today. There does not appear to be any signs of active infection which is great news and overall and I do believe that we are headed in the appropriate direction I think the PuraPly is doing an awesome job for her. 03/20/2021 upon evaluation today patient actually appears to be doing well with regard to her wound. We have been using the PuraPly although last week when I was out of the office they actually just used endoform nonetheless this still seems to be doing great. Fortunately there does not appear to be any signs of active infection which is great news overall I think the patient is making wonderful progress. 03/27/2021 upon evaluation today patient  appears to be doing well with regard to her wound in fact there is a little drainage but otherwise her does not appear to be any major signs of open wounds nor infection at this time. Overall I am extremely pleased with where things stand. No fevers, chills, nausea, vomiting, or diarrhea. 04/03/2021 upon evaluation today patient presents for reevaluation here in the clinic and overall she seems to be doing quite well. Fortunately there does not appear to be any signs of active infection which is great news and in general I am very pleased with how things appear. There is still a very small opening remaining but in general she is looking much better. Fortunately I do not think there is any need for the PuraPly at this point. 04/10/2021 upon evaluation today patient's wound is actually showing signs of excellent improvement. I am actually very pleased with where it stands today. I think she is very close to complete closure in fact this may be healed today. Either way I think that she may benefit from using her own compression socks for the next week and then will subsequently see how things stand following. 04/17/2021 upon evaluation today patient appears to be doing well with regard to her wound on the ankle region. This is actually showing signs of excellent improvement which is great news. I do not see any signs of active infection at this time which is great and no open wound. With that being said on the lateral portion of her ankle right at the base of her plate she actually has an area of pressure that occurred over the past week when she was not in the wrap or we been padding her. This does seem to be an issue which I think is good to be an ongoing problem I am can actually get in touch with Dr. [name] today to see what we can do in that regard. 04/24/2021 upon evaluation today patient appears to be doing well with regard to her ankle region for the most part although there does appear to be some  fluid trapped underneath the area where we thought this was healed last week. Unfortunately I think at this time that we can have to reopen and clean away some of this  area and then subsequently depending on how things do over the next week or so we will see about discharge but right now I do not think that is the right way to go. She voiced understanding. 05/01/2021 upon evaluation today patient's wound still appears to be open unfortunately. I do believe that we do need to see what we can do about trying to get this closed I believe compression wrapping is probably can to be indicated here. Fortunately there is no signs of active infection at this time. 05/08/2021 upon evaluation today patient appears to be doing okay in regard to her ulcer this is very superficial and we will see any major issues here. The biggest problem currently that I find is that we just cannot get this area to completely seal up. She seemed to almost be and then things digressed a little bit. Right now is not too bad but we do need to see if we can make some difference here. I think maybe switching over to collagen may be better. 05/15/2021 upon evaluation today patient actually appears to be doing pretty well in regard to her wound. This is measuring a little smaller and looking much better. She did see orthopedics today there to me making her a insert for her shoe to try to help straighten out her gait. I think this can be beneficial for her as well. Objective Constitutional Well-nourished and well-hydrated in no acute distress. Vitals Time Taken: 1:31 PM, Height: 68 in, Temperature: 98.2 F, Pulse: 64 bpm, Respiratory Rate: 18 breaths/min, Blood Pressure: 114/55 mmHg. Respiratory normal breathing without difficulty. Psychiatric this patient is able to make decisions and demonstrates good insight into disease process. Alert and Oriented x 3. pleasant and cooperative. General Notes: Upon inspection patient's wound bed  actually showed signs of good granulation and epithelization at this point. Fortunately I think that she is making progress here. There is a little bit of minimal slough noted on the surface of the wound I was able to clean this away with saline and gauze no sharp debridement was even necessary today. Integumentary (Hair, Skin) Wound #15R status is Open. Original cause of wound was Gradually Appeared. The date acquired was: 12/30/2019. The wound has been in treatment 69 weeks. The wound is located on the Left,Medial Malleolus. The wound measures 1.1cm length x 0.7cm width x 0.1cm depth; 0.605cm^2 area and 0.06cm^3 volume. There is Fat Layer (Subcutaneous Tissue) exposed. There is no tunneling or undermining noted. There is a medium amount of serous drainage noted. The wound margin is distinct with the outline attached to the wound base. There is large (67-100%) red, pink granulation within the wound bed. There is no necrotic tissue within the wound bed. 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 There was a Double Layer Compression Therapy Procedure by [name], RN. Post procedure Diagnosis Wound #15R: Same as Pre-Procedure Plan Follow-up Appointments: Return Appointment in 1 week. - with Zaineb Nowaczyk Edema Control - Lymphedema / SCD / Other: Patient to wear own compression stockings every day. Exercise regularly Moisturize legs daily. Non Wound Condition: Apply the following to affected area as directed: - Apply silicone bordered foam to left lateral foot for protection WOUND #15R: -  Malleolus Wound Laterality: Left,  Medial Cleanser: Wound Cleanser (Generic) 1 x Per Day/15 Days Discharge Instructions: Cleanse the wound with wound cleanser prior to applying a clean dressing using gauze sponges, not tissue or cotton balls. Prim Dressing: Promogran Prisma Matrix, 4.34 (sq in) (silver collagen) 1 x Per Day/15 Days ary Discharge Instructions: Moisten collagen with saline or hydrogel Secondary Dressing: Woven Gauze Sponge, Non-Sterile 4x4 in (Generic) 1 x Per Day/15 Days Discharge Instructions: Apply over primary dressing as directed. Com pression Wrap: CoFlex TLC XL 2-layer Compression System 4x7 (in/yd) 1 x Per Day/15 Days Discharge Instructions: Apply CoFlex 2-layer compression as directed. (alt for 4 layer) 1. I am good recommend currently that we go ahead and continue with the silver collagen dressing as I feel like this is helping her for the most part I think we are making progress here. 2. I am also can recommend that we have the patient continue with the compression wrap for the time being I think that is doing better than just using her own compression socks he was get a little bit too much swelling with that. 3. I am also can recommend that we have the patient continue to monitor for any signs of worsening such as increased pain or increased swelling if she has any of that going on she should let me know soon as possible. We will see patient back for reevaluation in 1 week here in the clinic. If anything worsens or changes patient will contact our office for additional recommendations. Electronic Signature(s) Signed: 05/15/2021 2:02:06 PM By: [name] PA-C Entered By: [name] on 05/15/2021 14:02:06 -------------------------------------------------------------------------------- SuperBill Details Patient Name: Date of Service: [facility] [name], [name] 05/15/2021 Medical Record Number: [id number] Patient Account Number: [id number] Date of Birth/Sex: Treating RN: 1955-06-12 (65 y.o. [name] Primary Care Provider: [name] Other Clinician: Referring Provider: Treating Provider/Extender: [name] in Treatment: 69 Diagnosis Coding ICD-10 Codes Code Description [phone] Chronic venous hypertension (idiopathic) with ulcer and inflammation of left lower extremity L97.828 Non-pressure chronic ulcer of other part of left lower leg with other specified severity L97.328 Non-pressure chronic ulcer of left ankle with other specified severity Facility Procedures CPT4 Code: [id number] Description: (Facility Use Only) [phone] - [place] [facility] LWR LT LEG ICD-10 Diagnosis Description L97.828 Non-pressure chronic ulcer of other part of left lower leg with other specified sever Modifier: ity Quantity: 1 Physician Procedures : CPT4 Code Description Modifier [id number] - WC PHYS LEVEL 3 - EST PT ICD-10 Diagnosis Description I87.332 Chronic venous hypertension (idiopathic) with ulcer and inflammation of left lower extremity L97.828 Non-pressure chronic ulcer of other part  of left lower leg with other specified severity L97.328 Non-pressure chronic ulcer of left ankle with other specified severity Quantity: 1 Electronic Signature(s) Signed: 05/15/2021 2:02:18 PM By: [name] PA-C Entered By: [name] on 05/15/2021 14:02:18

## 2021-05-22 ENCOUNTER — Other Ambulatory Visit: Payer: Self-pay

## 2021-05-22 ENCOUNTER — Encounter (HOSPITAL_BASED_OUTPATIENT_CLINIC_OR_DEPARTMENT_OTHER): Payer: Medicare Other | Admitting: Physician Assistant

## 2021-05-22 DIAGNOSIS — L97828 Non-pressure chronic ulcer of other part of left lower leg with other specified severity: Secondary | ICD-10-CM | POA: Diagnosis not present

## 2021-05-22 NOTE — Progress Notes (Signed)
SANTIA, LABATE ([id number]) Visit Report for 05/22/2021 Arrival Information Details Patient Name: Date of Service: [place] [name], [name] 05/22/2021 9:30 A M Medical Record Number: [id number] Patient Account Number: [id number] Date of Birth/Sex: Treating RN: March 24, 1956 (65 y.o. [name], Meta.Reding Primary Care Naidelin Gugliotta: [name] Other Clinician: Referring Maxamus Colao: Treating Bond Grieshop/Extender: [name] in Treatment: 36 Visit Information History Since Last Visit Added or deleted any medications: No Patient Arrived: Cane Any new allergies or adverse reactions: No Arrival Time: 10:24 Had a fall or experienced change in No Accompanied By: self activities of daily living that may affect Transfer Assistance: None risk of falls: Patient Identification Verified: Yes Signs or symptoms of abuse/neglect since last visito No Secondary Verification Process Completed: Yes Hospitalized since last visit: No Patient Requires Transmission-Based Precautions: No Implantable device outside of the clinic excluding No Patient Has Alerts: Yes cellular tissue based products placed in the center Patient Alerts: L ABI =1.12, TBI = .92 since last visit: R ABI= 1.02, TBI= .58 Has Dressing in Place as Prescribed: Yes Has Compression in Place as Prescribed: Yes Pain Present Now: No Electronic Signature(s) Signed: 05/22/2021 6:10:28 PM By: Deon Pilling RN, BSN Entered By: Deon Pilling on 05/22/2021 10:25:54 -------------------------------------------------------------------------------- Compression Therapy Details Patient Name: Date of Service: [name], [name] 05/22/2021 9:30 A M Medical Record Number: [id number] Patient Account Number: [id number] Date of Birth/Sex: Treating RN: 1956-02-27 (65 y.o. [name] Primary Care Alice Burnside: [name] Other Clinician: Referring Kylen Ismael: Treating Grisell Bissette/Extender: [name] in Treatment:  19 Compression Therapy Performed for Wound Assessment: Wound #15R Left,Medial Malleolus Performed By: Clinician Deon Pilling, RN Compression Type: Double Layer Post Procedure Diagnosis Same as Pre-procedure Electronic Signature(s) Signed: 05/22/2021 6:10:28 PM By: Deon Pilling RN, BSN Entered By: Deon Pilling on 05/22/2021 11:13:04 -------------------------------------------------------------------------------- Encounter Discharge Information Details Patient Name: Date of Service: [name], [name] 05/22/2021 9:30 A M Medical Record Number: [id number] Patient Account Number: [id number] Date of Birth/Sex: Treating RN: [birth year] (65 y.o. [name] Primary Care Biagio Snelson: [name] Other Clinician: Referring Kayti Poss: Treating Elic Vencill/Extender: [name] in Treatment: 74 Encounter Discharge Information Items Post Procedure Vitals Discharge Condition: Stable Temperature (F): 97.6 Ambulatory Status: Cane Pulse (bpm): 58 Discharge Destination: Home Respiratory Rate (breaths/min): 16 Transportation: Private Auto Blood Pressure (mmHg): 130/67 Accompanied By: self Schedule Follow-up Appointment: Yes Clinical Summary of Care: Electronic Signature(s) Signed: 05/22/2021 6:10:28 PM By: Deon Pilling RN, BSN Entered By: Deon Pilling on 05/22/2021 11:18:52 -------------------------------------------------------------------------------- Lower Extremity Assessment Details Patient Name: Date of Service: [facility] [name], [name] 05/22/2021 9:30 A M Medical Record Number: [id number] Patient Account Number: [id number] Date of Birth/Sex: Treating RN: [birth year] (65 y.o. [name] Primary Care Maddisyn Hegwood: [name] Other Clinician: Referring Leighanna Kirn: Treating Pasha Broad/Extender: [name] Weeks in Treatment: 70 Edema Assessment Assessed: [Left: Yes] [Right: No] Edema: [Left: N] [Right: o] Calf Left: Right: Point  of Measurement: 31 cm From Medial Instep 27 cm Ankle Left: Right: Point of Measurement: 11 cm From Medial Instep 23 cm Vascular Assessment Pulses: Dorsalis Pedis Palpable: [Left:Yes] Electronic Signature(s) Signed: 05/22/2021 6:10:28 PM By: Deon Pilling RN, BSN Entered By: Deon Pilling on 05/22/2021 10:34:48 -------------------------------------------------------------------------------- [place] Details Patient Name: Date of Service: [name], [name] 05/22/2021 9:30 A M Medical Record Number: [id number] Patient Account Number: [id number] Date of Birth/Sex: Treating RN: June 16, 1955 (65 y.o. F) Deaton, Ney  Primary Care Clarnce Homan: [name] Other Clinician: Referring Anicia Leuthold: Treating Tavarus Poteete/Extender: [name] in Treatment: 23 Multidisciplinary Care Plan reviewed with physician Active Inactive Venous Leg Ulcer Nursing Diagnoses: Actual venous Insuffiency (use after diagnosis is confirmed) Knowledge deficit related to disease process and management Goals: Patient will maintain optimal edema control Date Initiated: 06/20/2020 Target Resolution Date: 06/05/2021 Goal Status: Active Interventions: Assess peripheral edema status every visit. Compression as ordered Treatment Activities: Therapeutic compression applied : 06/20/2020 Notes: 01/30/21: Edema control ongoing, using CoFlex wrap. Wound/Skin Impairment Nursing Diagnoses: Impaired tissue integrity Knowledge deficit related to ulceration/compromised skin integrity Goals: Patient/caregiver will verbalize understanding of skin care regimen Date Initiated: 01/17/2020 Target Resolution Date: 06/05/2021 Goal Status: Active Ulcer/skin breakdown will have a volume reduction of 30% by week 4 Date Initiated: 01/17/2020 Date Inactivated: 03/12/2020 Target Resolution Date: 03/09/2020 Goal Status: Met Interventions: Assess patient/caregiver ability to obtain necessary  supplies Assess patient/caregiver ability to perform ulcer/skin care regimen upon admission and as needed Assess ulceration(s) every visit Provide education on ulcer and skin care Notes: 01/30/21: Wound care regimen continues, PuraPly skin sub being used. 04/17/21: Wound healed Electronic Signature(s) Signed: 05/22/2021 6:10:28 PM By: Deon Pilling RN, BSN Entered By: Deon Pilling on 05/22/2021 11:10:57 -------------------------------------------------------------------------------- Pain Assessment Details Patient Name: Date of Service: [name], [name] 05/22/2021 9:30 A M Medical Record Number: [id number] Patient Account Number: [id number] Date of Birth/Sex: Treating RN: September 18, 1955 (65 y.o. [name], Meta.Reding Primary Care Jonne Rote: [name] Other Clinician: Referring Eriko Economos: Treating Charleigh Correnti/Extender: [name] in Treatment: 33 Active Problems Location of Pain Severity and Description of Pain Patient Has Paino No Site Locations Pain Management and Medication Current Pain Management: Medication: No Cold Application: No Rest: No Massage: No Activity: No T.E.N.S.: No Heat Application: No Leg drop or elevation: No Is the Current Pain Management Adequate: Adequate How does your wound impact your activities of daily livingo Sleep: No Bathing: No Appetite: No Relationship With Others: No Bladder Continence: No Emotions: No Bowel Continence: No Work: No Toileting: No Drive: No Dressing: No Hobbies: No Electronic Signature(s) Signed: 05/22/2021 6:10:28 PM By: Deon Pilling RN, BSN Entered By: Deon Pilling on 05/22/2021 10:34:36 -------------------------------------------------------------------------------- Patient/Caregiver Education Details Patient Name: Date of Service: [name], [name] 12/21/2022andnbsp9:30 A M Medical Record Number: [id number] Patient Account Number: [id number] Date of Birth/Gender: Treating  RN: 1955-06-30 (65 y.o. [name] Primary Care Physician: [name] Other Clinician: Referring Physician: Treating Physician/Extender: [name] in Treatment: 30 Education Assessment Education Provided To: Patient Education Topics Provided Wound/Skin Impairment: Handouts: Skin Care Do's and Dont's Methods: Explain/Verbal Responses: Reinforcements needed Electronic Signature(s) Signed: 05/22/2021 6:10:28 PM By: Deon Pilling RN, BSN Entered By: Deon Pilling on 05/22/2021 11:11:20 -------------------------------------------------------------------------------- Wound Assessment Details Patient Name: Date of Service: [name], [name] 05/22/2021 9:30 A M Medical Record Number: [id number] Patient Account Number: [id number] Date of Birth/Sex: Treating RN: 1955-12-27 (65 y.o. [name], [name] Primary Care Yan Okray: [name] Other Clinician: Referring Pennye Beeghly: Treating Garlene Apperson/Extender: [name] in Treatment: 27 Wound Status Wound Number: 15R Primary Venous Leg Ulcer Etiology: Wound Location: Left, Medial Malleolus Wound Status: Open Wounding Event: Gradually Appeared Comorbid Peripheral Venous Disease, Osteoarthritis, Confinement Date Acquired: 12/30/2019 History: Anxiety Weeks Of Treatment: 70 Clustered Wound: No Photos Wound Measurements Length: (cm) 1.6 Width: (cm) 1 Depth: (cm) 0.1 Area: (cm) 1.257 Volume: (cm) 0.126 % Reduction in Area: 89.2% % Reduction  in Volume: 98.6% Epithelialization: Small (1-33%) Tunneling: No Undermining: No Wound Description Classification: Full Thickness With Exposed Support Structures Wound Margin: Distinct, outline attached Exudate Amount: Medium Exudate Type: Serous Exudate Color: amber Foul Odor After Cleansing: No Slough/Fibrino Yes Wound Bed Granulation Amount: Medium (34-66%) Exposed Structure Granulation Quality: Red, Pink Fascia Exposed:  No Necrotic Amount: Medium (34-66%) Fat Layer (Subcutaneous Tissue) Exposed: Yes Necrotic Quality: Adherent Slough Tendon Exposed: No Muscle Exposed: No Joint Exposed: No Bone Exposed: No Treatment Notes Wound #15R (Malleolus) Wound Laterality: Left, Medial Cleanser Wound Cleanser Discharge Instruction: Cleanse the wound with wound cleanser prior to applying a clean dressing using gauze sponges, not tissue or cotton balls. Peri-Wound Care Topical Primary Dressing Endoform 2x2 in Discharge Instruction: Moisten with saline adaptic Discharge Instruction: apply adaptic over the endoform Secondary Dressing Woven Gauze Sponge, Non-Sterile 4x4 in Discharge Instruction: Apply over primary dressing as directed. Secured With Compression Wrap CoFlex TLC XL 2-layer Compression System 4x7 (in/yd) Discharge Instruction: Apply CoFlex 2-layer compression as directed. (alt for 4 layer) Compression Stockings Add-Ons Electronic Signature(s) Signed: 05/22/2021 6:10:28 PM By: Deon Pilling RN, BSN Entered By: Deon Pilling on 05/22/2021 10:35:53 -------------------------------------------------------------------------------- Vitals Details Patient Name: Date of Service: [name], [name] 05/22/2021 9:30 A M Medical Record Number: [id number] Patient Account Number: [id number] Date of Birth/Sex: Treating RN: 18-Sep-1955 (65 y.o. [name], [name] Primary Care Twinkle [name]: [name] Other Clinician: Referring Ona Roehrs: Treating Jolana Runkles/Extender: [name] in Treatment: 67 Vital Signs Time Taken: 10:25 Temperature (F): 97.6 Height (in): 68 Pulse (bpm): 58 Respiratory Rate (breaths/min): 20 Blood Pressure (mmHg): 130/67 Reference Range: 80 - 120 mg / dl Electronic Signature(s) Signed: 05/22/2021 6:10:28 PM By: Deon Pilling RN, BSN Entered By: Deon Pilling on 05/22/2021 10:25:48

## 2021-05-22 NOTE — Progress Notes (Signed)
[name], [name] ([id number]) Visit Report for 05/22/2021 Chief Complaint Document Details Patient Name: Date of Service: [facility] [name], [name] 05/22/2021 9:30 A M Medical Record Number: [id number] Patient Account Number: [id number] Date of Birth/Sex: Treating RN: 1956-02-03 (65 y.o. [name] Primary Care Provider: Lennie [name] Other Clinician: Referring Provider: Treating Provider/Extender: [name] in Treatment: 57 Information Obtained from: Patient Chief Complaint patient is been followed long-term in this clinic for venous insufficiency ulcers with inflammation, hypertension and ulceration over the medial ankle bilaterally. 01/17/2020; this is a patient who is here for review of postoperative wounds on the left lateral ankle and recurrence of venous stasis ulceration on the left medial Electronic Signature(s) Signed: 05/22/2021 11:30:25 AM By: [name] PA-C Entered By: Worthy [name] on 05/22/2021 11:30:25 -------------------------------------------------------------------------------- Debridement Details Patient Name: Date of Service: [facility] [name], [name] NO R [name]. 05/22/2021 9:30 A M Medical Record Number: [id number] Patient Account Number: [id number] Date of Birth/Sex: Treating RN: [birth year] (65 y.o. [name], [name] Primary Care Provider: Lennie [name] Other Clinician: Referring Provider: Treating Provider/Extender: [name] in Treatment: 24 Debridement Performed for Assessment: Wound #15R Left,Medial Malleolus Performed By: Physician [name], PA Debridement Type: Debridement Severity of Tissue Pre Debridement: Fat layer exposed Level of Consciousness (Pre-procedure): Awake and Alert Pre-procedure Verification/Time Out Yes - 11:10 Taken: Start Time: 11:11 Pain Control: Lidocaine 5% topical ointment T Area Debrided (L x W): otal 1.6 (cm) x 1 (cm) = 1.6 (cm) Tissue and other material  debrided: Viable, Non-Viable, Slough, Subcutaneous, Skin: Dermis , Skin: Epidermis, Fibrin/Exudate, Slough Level: Skin/Subcutaneous Tissue Debridement Description: Excisional Instrument: Curette Bleeding: Minimum Hemostasis Achieved: Pressure End Time: 11:14 Procedural Pain: 0 Post Procedural Pain: 0 Response to Treatment: Procedure was tolerated well Level of Consciousness (Post- Awake and Alert procedure): Post Debridement Measurements of Total Wound Length: (cm) 1.6 Width: (cm) 1 Depth: (cm) 0.1 Volume: (cm) 0.126 Character of Wound/Ulcer Post Debridement: Improved Severity of Tissue Post Debridement: Fat layer exposed Post Procedure Diagnosis Same as Pre-procedure Electronic Signature(s) Signed: 05/22/2021 5:18:31 PM By: [name] PA-C Signed: 05/22/2021 6:10:28 PM By: [name] Pilling RN, BSN Entered By: [name] on 05/22/2021 11:14:44 -------------------------------------------------------------------------------- HPI Details Patient Name: Date of Service: [name]. 05/22/2021 9:30 A M Medical Record Number: [id number] Patient Account Number: [id number] Date of Birth/Sex: Treating RN: [birth year] (65 y.o. [name] Primary Care Provider: Lennie [name] Other Clinician: Referring Provider: Treating Provider/Extender: [name] in Treatment: 18 History of Present Illness HPI Description: the remaining wound is over the left medial ankle. Similar wound over the right medial ankle healed largely with use of Apligraf. Most recently we have been using Hydrofera Blue over this wound with considerable improvement. The patient has been extensively worked up in the past for her venous insufficiency and she is not a candidate for antireflux surgery although I have none of the details available currently. 08/24/14; considerable improvement today. About 50% of this wound areas now epithelialized. The base of the wound appears to be  healthier granulation.as opposed to last week when she had deteriorated a considerable improvement 08/17/14; unfortunately the wound has regressed somewhat. The areas of epithelialization from the superior aspect are not nearly as healthy as they were last week. The patient thinks her Hydrofera Blue slipped. 09/07/14; unfortunately the area has markedly regressed in the 2 weeks since I've seen this. There is an [name] surrounding erythema.  The healthy granulation tissue that we had at the base of the wound now is a dusky color. The nurse reports green drainage 09/14/14; the area looks somewhat better than last week. There is less erythema and less drainage. The culture I did did not show any growth. Nevertheless I think it is better to continue the Cipro and doxycycline for a further week. The remaining wound area was debridement. 09/21/14. Wound did not require debridement last week. Still less erythema and less drainage. She can complete her antibiotics. The areas of epithelialization in the superior aspect of the wound do not look as healthy as they did some weeks ago 10/05/14 continued improvement in the condition of this wound. There is advancing epithelialization. Less aggressive debridement required 10/19/14 continued improvement in the condition and volume of this wound. Less aggressive debridement to the inferior part of this to remove surface slough and fibrinous eschar 11/02/14 no debridement is required. The surface granulation appears healthy although some of her islands of epithelialization seem to have regressed. No evidence of infection 11/16/14; lites surface debridement done of surface eschar. The wound does not look to be unhealthy. No evidence of infection. Unfortunately the patient has had podiatry issues in the right foot and for some reason has redeveloped small surface ulcerations in the medial right ankle. Her original presentation involved wounds in this area 11/23/14 no debridement.  The area on the right ankle has enlarged. The left ankle wound appears stable in terms of the surface although there is periwound inflammation. There has been regression in the amount of new skin 11/30/14 no debridement. Both wound areas appear healthy. There was no evidence of infection. The the new area on the right medial ankle has enlarged although that both the surfaces appear to be stable. 12/07/14; Debridement of the right medial ankle wound. No no debridement was done on the left. 12/14/14 no major change in and now bilateral medial ankle wounds. Both of these are very painful but the no overt evidence of infection. She has had previous venous ablation 12/21/14; patient states that her right medial ankle wound is considerably more painful last week than usual. Her left is also somewhat painful. She could not tolerate debridement. The right medial ankle wound has fibrinous surface eschar 12/28/14 this is a patient with severe bilateral venous insufficiency ulcers. For a considerable period of time we actually had the one on the right medial ankle healed however this recently opened up again in June. The left medial ankle wound has been a refractory area with some absent flows. We had some success with Hydrofera Blue on this area and it literally closed by 50% however it is recently opened up Foley. Both of these were debridement today of surface eschar. She tolerates this poorly 01/25/15: No change in the status of this. Thick adherent escar. Very poor tolerance of any attempt at debridement. I had healed the right medial malleolus wound for a considerable amount of time and had the left one down to about 50% of the volume although this is totally regressed over the last 48 weeks. Further the right leg has reopened. she is trying to make a appointment with pain and vascular, previous ablations with Dr. Aleda Grana. I do not believe there is an arterial insufficiency issue here 02/01/15 the status of  the adherent eschar bilaterally is actually improved. No debridement was done. She did not manage to get vascular studies done 02/08/15 continued debridement of the area was done today. The slough is less  adherent and comes off with less pressure. There is no surrounding infection peripheral pulses are intact 02/15/15 selective debridement with a disposable curette. Again the slough is less adherent and comes off with less difficulty. No surrounding infection peripheral pulses are intact. 02/22/15 selective debridement of the right medial ankle wound. Slough comes off with less difficulty. No obvious surrounding infection peripheral pulses are intact I did not debridement the one on the left. Both of these are stable to improved 03/01/15 selective debridement of both wound areas using a curette to. Adherent slough cup soft with less difficulty. No obvious surrounding infection. The patient tells me that 2 days ago she noted a rash above the right leg wrap. She did not have this on her lower legs when she change this over she arrives with widespread left greater than right almost folliculitis-looking rash which is extremely pruritic. I don't see anything to culture here. There is no rash on the rest of her body. She feels well systemically. 03/08/15; selective debridement of both wounds using a curette. Base of this does not look unhealthy. She had limegreen drainage coming out of the left leg wound and describes a lot of drainage. The rash on her left leg looks improved to. No cultures were done. 03/22/15; patient was not here last week. Basal wounds does not look healthy and there is no surrounding erythema. No drainage. There is still a rash on the left leg that almost looks vasculitic however it is clearly limited to the top of where the wrap would be. 04/05/15; on the right required a surgical debridement of surface eschar and necrotic subcutaneous tissue. I did not debridement the area on the left.  These continue to be large open wounds that are not changing that much. We were successful at one point in healing the area on the right, and at the same time the area on the left was roughly half the size of current measurements. I think a lot of the deterioration has to do with the prolonged time the patient is on her feet at work 04/19/15 I attempted-like surface debridement bilaterally she does not tolerate this. She tells me that she was in allergic care yesterday with extreme pain over her left lateral malleolus/ankle and was told that she has an "sprain" 05/03/15; large bilateral venous insufficiency wounds over the medial malleolus/medial aspect of her ankles. She complains of copious amounts of drainage and his usual large amounts of pain. There is some increasing erythema around the wound on the right extending into the medial aspect of her foot to. historically she came in with these wounds the right one healed and the left one came down to roughly half its current size however the right one is reopened and the left is expanded. 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 05/10/15 large bilateral venous insufficiency wounds. There is less adherence surface left however the surface culture that I did last week grew pseudomonas therefore bilateral selective debridement score necessary. There is surrounding erythema. The patient describes severe bilateral drainage and a lot of pain in the left ankle. Apparently her podiatrist was were ready to do a cortisone shot 05/17/15; the patient complains of pain and again copious amounts of drainage. 05/24/15; we used Iodo flex last week. Patient notes considerable improvement in wound drainage. Only needed to change this once. 05/31/15; we continued Iodoflex; the base of these large wounds bilaterally is not too  bad but there is probably likely a significant bioburden here. I would like to debridement  just doesn't tolerate it. 06/06/14 I would like to continue the Iodoflex although she still hasn't managed to obtain supplies. She has bilateral medial malleoli or large wounds which are mostly superficial. Both of them are covered circumferentially with some nonviable fibrinous slough although she tolerates debridement very poorly. She apparently has an appointment for an ablation on the right leg by interventional radiology. 06/14/15; the patient arrives with the wounds and static condition. We attempted a debridement although she does not do well with this secondary to pain. I 07/05/15; wounds are not much smaller however there appears to be a cleaner granulating base. The left has tight fibrinous slough greater than the right. Debridement is tolerated poorly due to pain. Iodoflex is done more for these wounds in any of the multitude of different dressings I have tried on the left 1 and then subsequently the right. 07/12/15; no change in the condition of this wound. I am able to do an aggressive debridement on the right but not the left. She simply cannot tolerate it. We have been using Iodoflex which helps somewhat. It is worthwhile remembering that at one point we healed the right medial ankle wound and the left was about 25% of the current circumference. We have suggested returning to vascular surgery for review of possible further ablations for one reason or another she has not been able to do this. 07/26/15 no major change in the condition of either wound on her medial ankle. I did not attempt to debridement of these. She has been aggressively scrubbing these while she is in the shower at home. She has her supply of Iodoflex which seems to have done more for these wounds then anything I have put on recently. 08/09/15 wound area appears larger although not verified by measurements. Using Iodoflex 09/05/2015 -- she was here for avisit today but had significant problems with the wound and I was asked to  see her for a physician opinion. I have summarize that this lady has had surgery on her left lower extremity about 10 years ago where the possible veins stripping was done. She has had an opinion from interventional radiology around November 2016 where no further sclerotherapy was ordered. The patient works 12 hours a day and stands on a concrete floor with work boots and is unable to get the proper compression she requires and cannot elevate her limbs appropriately at any given time. She has recently grown Pseudomonas from her wound culture but has not started her ciprofloxacin which was called in for her. 09/13/15 this continues to be a difficult situation for this patient. At one point I had this wound down to a 1.5 x 1.5" wound on her left leg. This is deteriorated and the right leg has reopened. She now has substantial wounds on her medial calcaneus, malleoli and into her lower leg. One on the left has surface eschar but these are far too painful for me to debridement here. She has a vascular surgery appointment next week to see if anything can be done to help here. I think she has had previous ablations several years ago at [facility]. She has no major edema. She tells me that she did not get product last time [facility]] and went for several days without it. She continues to work in work boots 12 hours a day. She cannot get compression/4-layer under her work boots. 09/20/15 no major change. Periwound  edema control was not very good. Her point with pain and vascular is next Wednesday the 25th 09/28/15; the patient is seen vascular surgery and is apparently scheduled for repeat duplex ultrasounds of her bilateral lower legs next week. 10/05/15; the patient was seen by Dr. Doren Custard of vascular surgery. He feels that she should have arterial insufficiency excluded as cause/contributed to her nonhealing stage she is therefore booked for an arteriogram. She has apparently monophasic signals in the  dorsalis pedis pulses. She also of course has known severe chronic venous insufficiency with previous procedures as noted previously. I had another long discussion with the patient today about her continuing to work 12 hour shifts. I've written her out for 2 months area had concerns about this as her work location is currently undergoing significant turmoil and this may lead to her termination. She is aware of this however I agree with her that she simply cannot continue to stand for 12 hours multiple days a week with the substantial wound areas she has. 10/19/15; the Dr. Doren Custard appointment was largely for an arteriogram which was normal. She does not have an arterial issue. He didn't make a comment about her chronic venous insufficiency for which she has had previous ablations. Presumably it was not felt that anything additional could be done. The patient is now out of work as I prescribed 2 weeks ago. Her wounds look somewhat less aggravated presumably because of this. I felt I would give debridement another try today 10/25/15; no major change in this patient's wounds. We are struggling to get her product that she can afford into her own home through her insurance. 11/01/15; no major change in the patient's wounds. I have been using silver alginate as the most affordable product. I spoke to Dr. Marla Roe last week with her requested take her to the OR for surgical debridement and placement of ACEL. Dr. Marla Roe told me that she would be willing to do this however [facility] will not cover this, fortunately the patient has [place] healthcare of some variant 11/08/15; no major change in the patient's wounds. She has been completely nonviable surface that this but is in too much pain with any attempted debridement are clinic. I have arranged for her to see Dr. Marla Roe ham of plastic surgery and this appointment is on Monday. I am hopeful that they will take her to the OR for debridement,  possible ACEL ultimately possible skin graft 11/22/15 no major change in the patient's wounds over her bilateral medial calcaneus medial malleolus into the lower legs. Surface on these does not look too bad however on the left there is surrounding erythema and tenderness. This may be cellulitis or could him sleepy tinea. 11/29/15; no major changes in the patient's wounds over her bilateral medial malleolus. There is no infection here and I don't think any additional antibiotics are necessary. There is now plan to move forward. She sees Dr. Marla Roe in a week's time for preparation for operative debridement and ACEL placement I believe on 7/12. She then has a follow-up appointment with Dr. Marla Roe on 7/21 12/28/15; the patient returns today having been taken to the [facility] by Dr. Marla Roe 12/12/15 she underwent debridement, intraoperative cultures [which were negative]. She had placement of a wound VAC. Parent really ACEL was not available to be placed. The wound VAC foam apparently adhered to the wound since then she's been using silver alginate, Xeroform under Ace wraps. She still says there is a lot of drainage and a  lot of pain 01/31/16; this is a patient I see monthly. I had referred her to Dr. Marla Roe him of plastic surgery for large wounds on her bilateral medial ankles. She has been to the OR twice once in early July and once in early August. She tells me over the last 3 weeks she has been using the wound VAC with ACEL underneath it. On the right we've simply been using silver alginate. Under Kerlix Coban wraps. 02/28/16; this is a patient I'm currently seeing monthly. She is gone on to have a skin graft over her large venous insufficiency ulcer on the left medial ankle. This was done by Dr. Marla Roe him. The patient is a bit perturbed about why she didn't have one on her right medial ankle wound. She has been using silver alginate to this. 03/06/16; I received a phone call from her plastic  surgery Dr. Marla Roe. She expressed some concern about the viability of the skin graft she did on the left medial ankle wound. Asked me to place Endoform on this. She told me she is not planning to do a subsequent skin graft on the right as the left one did not take very well. I had placed Hydrofera Blue on the right 03/13/16; continue to have a reasonably healthy wound on the right medial ankle. Down to 3 mm in terms of size. There is epithelialization here. The area on the left medial ankle is her skin graft site. I suppose the last week this looks somewhat better. She has an open area inferiorly however in the center there appears to be some viable tissue. There is a lot of surface callus and eschar that will eventually need to come off however none of this looked to be infected. Patient states that the is able to keep the dressing on for several days which is an improvement. 03/20/16 no major change in the circumference of either wound however on the left side the patient was at Dr. Eusebio Friendly office and they did a debridement of left wound. 50% of the wound seems to be epithelialized. I been using Endoform on the left Hydrofera Blue in the right 03/27/16; she arrives today with her wound is not looking as healthy as they did last week. The area on the right clearly has an adherent surface to this a very similar surface on the left. Unfortunately for this patient this is all too familiar problem. Clearly the Endoform is not working and will need to change that today that has some potential to help this surface. She does not tolerate debridement in this clinic very well. She is changing the dressing wants 04/03/16; patient arrives with the wounds looking somewhat better especially on the right. Dr. Migdalia Dk change the dressing to silver alginate when she saw her on Monday and also sold her some compression socks. The usefulness of the latter is really not clear and woman with severely draining  wounds. 04/10/16; the patient is doing a bit of an experiment wearing the compression stockings that Dr. Migdalia Dk provided her to her left leg and the out of legs based dressings that we provided to the right. 05/01/16; the patient is continuing to wear compression stockings Dr. Migdalia Dk provided her on the left that are apparently silver impregnated. She has been using Iodoflex to the right leg wound. Still a moderate amount of drainage, when she leaves here the wraps only last for 4 days. She has to change the stocking on the left leg every night 05/15/16; she is now  using compression stockings bilaterally provided by Dr. Marla Roe. She is wearing a nonadherent layer over the wounds so really I don't think there is anything specific being done to this now. She has some reduction on the left wound. The right is stable. I think all healing here is being done without a specific dressing 06/09/16; patient arrives here today with not much change in the wound certainly in diameter to large circular wounds over the medial aspect of her ankle bilaterally. Under the light of these services are certainly not viable for healing. There is no evidence of surrounding infection. She is wearing compression stockings with some sort of silver impregnation as prescribed by Dr. Marla Roe. She has a follow-up with her tomorrow. 06/30/16; no major change in the size or condition of her wounds. These are still probably covered with a nonviable surface. She is using only her purchase stockings. She did see Dr. Marla Roe who seemed to want to apply Dakin's solution to this I'm not extreme short what value this would be. I would suggest Iodoflex which she still has at home. 07/28/16; I follow Mrs. Boultinghouse episodically along with Dr. Marla Roe. 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 She has been applying a topical collagen ointment to both wounds with Adaptic. I  don't think Dr. Marla Roe is planning to take her back to the OR. 08/19/16; I follow Mrs. Jeneen Rinks on a monthly basis along with Dr. Marla Roe of plastic surgery. 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 I been following her for a number of years. At one point I was able to get the right medial malleolus wound to heal and had the left medial malleolus down to about half its current size however and I had to send her to plastic surgery for an operative debridement. Since then things have been stable to slightly improve the area on the right is slightly better one in the left about the same although there is much less adherent surface than I'm used to with this patient. She is using some form of liquid collagen gel that Dr. Marla Roe provided a Kerlix cover with the patient's own pressure stockings. She tells me that she has extreme pain in both ankles and along the lateral aspect of both feet. She has been unable to work for some period of time. She is telling me she is retiring at the beginning of April. She sees Dr. Doran Durand of orthopedics next week 09/22/16; patient has not seen Dr. Marla Roe since the last time she is here. I'm not really sure what she is using to the wounds other than bits and pieces of think she had left over including most recently Hydrofera Blue. She is using juxtalite stockings. She is having difficulty with her husband's recent illness "stroke". She is having to transport him to various doctors appointments. Dr. Marla Roe left her the option of a repeat debridement with ACEL however she has not been able to get the time to follow-up on this. She continues to have a fair amount of drainage out of these wounds with certainly precludes leaving dressings on all week 10/13/16; patient has not seen Dr. Marla Roe since she was last in our clinic. I'm not really sure what she is doing with the wounds, we did try to  get her [facility] and I think she is actually using this most of the time. Because of drainage she states she has  to change this every second day although this is an improvement from what she used to do. She went to see Dr. Doran Durand who did not think she had a muscular issue with regards to her feet, he referred her to a neurologist and I think the appointment is sometime in June. I changed her back to Iodoflex which she has used in the past but not recently. 11/03/16; the patient has been using Iodoflex although she ran out of this. Still claims that there is a lot of drainage although the wound does not look like this. No surrounding erythema. She has not been back to see Dr. Marla Roe 11/24/16; the patient has been using Iodoflex again but she ran out of it 2 or 3 days ago. There is no major change in the condition of either one of these wounds in fact they are larger and covered in a thick adherent surface slough/nonviable tissue especially on the left. She does not tolerate mechanical debridement in our clinic. Going back to see Dr. Marla Roe of plastic surgery for an operative debridement would seem reasonable. 12/15/16; the patient has not been back to see Dr. Marla Roe. She is been dealing with a series of illnesses and her husband which of monopolized her time. She is been using Sorbact which we largely supplied. She states the drainage is bad enough that it maximum she can go 2-3 days without changing the dressing 01/12/2017 -- the patient has not been back for about 4 weeks and has not seen Dr. Marla Roe not does she have any appointment pending. 01/23/17; patient has not seen Dr. Marla Roe even though I suggested this previously. She is using Santyl that was suggested last week by Dr. Con Memos this Cost her $16 through her insurance which is indeed surprising 02/12/17; continuing Santyl and the patient is changing this daily. A lot of drainage. She has not been back to see plastic surgery  she is using an Ace wrap. Our intake nurse suggested wrap around stockings which would make a good reasonable alternative 02/26/17; patient is been using Santyl and changing this daily due to drainage. She has not been to see plastic surgery she uses in April Ace wrap to control the edema. She did obtain extremitease stockings but stated that the edema in her leg was to big for these 03/20/17; patient is using Santyl and Anasept. Surfaces looked better today the area on the right is actually measuring a little smaller. She has states she has a lot of pain in her feet and ankles and is asking for a consult to pain control which I'll try to help her with through our case manager. 04/10/17; the patient arrives with better-looking wound surfaces and is slightly smaller wound on the left she is using a combination of Santyl and Anasept. She has an appointment or at least as started in the pain control center associated with [facility] 05/14/17; this is a patient who I followed for a prolonged period of time. She has venous insufficiency ulcers on her bilateral medial ankles. At one point I had this down to a much smaller wound on the left however these reopened and we've never been able to get these to heal. She has been using Santyl and Anasept gel although 2 weeks ago she ran out of the Anasept gel. She has a stable appearance of the wound. She is going to the wound care clinic at [facility]. They wanted do a nerve block/spinal block although she tells me she is reluctant to  go forward with that. 05/21/17; this is a patient I have followed for many years. She has venous insufficiency ulcers on her bilateral medial ankles. Chronic pain and deformity in her ankles as well. She is been to see plastic surgery as well as orthopedics. Using PolyMem AG most recently/Kerramax/ABDs and 2 layer compression. She has managed to keep this on and she is coming in for a nurse check to change the dressing on  Tuesdays, we see her on Fridays 06/05/17; really quite a good looking surface and the area especially on the right medial has contracted in terms of dimensions. Well granulated healthy-looking tissue on both sides. Even with an open curet there is nothing that even feels abnormal here. This is as good as I've seen this in quite some time. We have been using PolyMem AG and bringing her in for a nurse check 06/12/17; really quite good surface on both of these wounds. The right medial has contracted a bit left is not. We've been using PolyMem and AG and she is coming in for a nurse visit 06/19/17; we have been using PolyMem AG and bringing her in for a nurse check. Dimensions of her wounds are not better but the surfaces looked better bilaterally. She complained of bleeding last night and the left wound and increasing pain bilaterally. She states her wound pain is more neuropathic than just the wounds. There was some suggestion that this was radicular from her pain management doctor in talking to her it is really difficult to sort this out. 06/26/17; using PolyMem and AG and bringing her in for a nurse check as All of this and reasonably stable condition. Certainly not improved. The dimensions on the lateral part of the right leg look better but not really measuring better. The medial aspect on the left is about the same. 07/03/16; we have been using PolyMen AG and bringing her in for a nurse check to change the dressings as the wounds have drainage which precludes once weekly changing. We are using all secondary absorptive dressings.our intake nurse is brought up the idea of using a wound VAC/snap VAC on the wound to help with the drainage to see if this would result in some contraction. This is not a bad idea. The area on the right medial is actually looking smaller. Both wounds have a reasonable-looking surface. There is no evidence of cellulitis. The edema is well controlled 07/10/17; the patient was denied  for a snap VAC by her insurance. The major issue with these wounds continues to be drainage. We are using wicked PolyMem AG and she is coming in for a nurse visit to change this. The wounds are stable to slightly improved. The surface looks vibrant and the area on the right certainly has shrunk in size but very slowly 07/17/17; the patient still has large wounds on her bilateral medial malleoli. Surface of both of these wounds looks better. The dimensions seem to come and go but no consistent improvement. There is no epithelialization. We do not have options for advanced treatment products due to insurance issues. They did not approve of the wound VAC to help control the drainage. More recently we've been using PolyMem and AG wicked to allow drainage through. We have been bringing her in for a nurse visit to change this. We do not have a lot of options for wound care products and the home again due to insurance issues 07/24/17; the patient's wound actually looks somewhat better today. No drainage measurements are smaller  still healthy-looking surface. We used silver collagen under PolyMen started last week. We have been bringing her in for a dressing change 07/31/17; patient's wound surface continued to look better and I think there is visible change in the dimensions of the wound on the right. Rims of epithelialization. We have been using silver collagen under PolyMen and bringing her in for a dressing change. There appears to be less drainage although she is still in need of the dressing change 08/07/17. Patient's wound surface continues to look better on both sides and the area on the right is definitely smaller. We have been using silver collagen and PolyMen. She feels that the drainage has been it has been better. I asked her about her vascular status. She went to see Dr. Aleda Grana at [facility] and had some form of ablation. I don't have much detail on this. I haven't my notes from 2016 that she  was not a candidate for any further ablation but I don't have any more information on this. We had referred her to vein and vascular I don't think she ever went. He does not have a history of PAD although I don't have any information on this either. We don't even have ABIs in our record 08/14/17; we've been using silver collagen and PolyMen cover. And putting the patient and compression. She we are bringing her in as a nurse visit to change this because ofarge amount of drainage. We didn't the ABIs in clinic today since they had been done in many moons 1.2 bilaterally. She has been to see vein and vascular however this was at [facility] and she had ablation although I really don't have any information on this all seemed biking get a report. She is also been operatively debrided by plastic surgery and had a cell placed probably 8-12 months ago. This didn't have a major effect. We've been making some gains with current dressings 08/19/17-She is here in follow-up evaluation for bilateral medial malleoli ulcers. She continues to tolerate debridement very poorly. We will continue with recently changed topical treatment; if no significant improvement may consider switching to Iodosorb/Iodoflex. 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 She has been using silver collagen and PolyMem. I believe she has been used and tried on Iodoflex before. 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 These have been very refractory over time. We have been using silver collagen and PolyMen. She is coming in once a week for a doctor's and once a week for nurse visits. We are actually making some progress 09/18/17; the patient's wounds are smaller especially on the right medial. She arrives  today to upset to consider even washing these off with Anasept which I think is been part of the reason this is been closing. We've been using collagen covered in PolyMen otherwise. It is noted that she has a small area of folliculitis on the right medial calf that. As we are wrapping her legs I'll give her a short course of doxycycline to make sure this doesn't amount to anything. She is a long list of complaints today including imbalance, shortness of breath on exertion, inversion of her left ankle. With regards to the latter complaints she is been to see orthopedics and they offered her a tendon release surgery I believe  but wanted her wounds to be closed first. I have recommended she go see her primary physician with regards to everything else. 09/25/17; patient's wounds are about the same size. We have made some progress bilaterally although not in recent weeks. She will not allow me T wash these wounds with Anasept even if she is doing her cell. Wheeze we've been using collagen covered in PolyMen. Last week she had a small area of folliculitis this is now opened into a small wound. She completed 5 days of trimethoprim sulfamethoxazole 10/02/17; unfortunately the area on her left medial ankle is worse with a larger wound area towards the Achilles. The patient complains of a lot of pain. She will not allow debridement although visually I don't think there is anything to debridement in any case. We have been using silver collagen and PolyMen for several months now. Initially we are making some progress although I'm not really seeing that today. We will move back to [facility]. His admittedly this is a bit of a repeat however I'm hoping that his situation is different now. The patient tells me she had her leg on the left give out on her yesterday this is process some pain. 10/09/17; the patient is seen twice a week largely because of drainage issues coming out of the chronic medial bimalleolar wounds  that are chronic. Last week the dimensions of the one on the left looks a little larger I changed her to [facility]. She comes in today with a history of terrible pain in the bilateral wound areas. She will not allow debridement. She will not even allow a tissue culture. There is no surrounding erythema no no evidence of cellulitis. We have been putting her Kerlix Coban man. She will not allow more aggressive compression as there was a suggestion to put her in 3 layer wraps. 10/16/17; large wounds on her bilateral medial malleoli. These are chronic. Not much change from last week. The surface looks have healthy but absolutely no epithelialization. A lot of pain little less so of drainage. She will not allow debridement or even washing these off in the vigorous fashion with Anasept. 10/23/17; large wounds on her bilateral malleoli which are chronic. Some improvement in terms of size perhaps on the right since last time I saw these. She states that after we increased the 3 layer compression there was some bleeding, when she came in for a nurse visit she did not want 3 layer compression put back on about our nurse managed to convince her. She has known chronic venous visit issues and I'm hoping to get her to tolerate the 3 layer compression. using Hydrofera Blue 10/30/17; absolutely no change in the condition of either wound although we've had some improvement in dimensions on the right.. Attempted to put her in 3 layer compression she didn't tolerated she is back in 2 layer compression. We've been using Hydrofera Blue We looked over her past records. She had venous reflux studies in November 2016. There was no evidence of deep venous reflux on the right. Superficial vein did not show the greater saphenous vein at think this is been previously ablated the small saphenous vein was within normal limits. The left deep venous system showed no DVT the vessels were positive for deep venous reflux in the  posterior tibial veins at the ankle. The greater saphenous vein was surgically absent small saphenous vein was within normal limits. She went to vein and vascular at [facility]. I believe she had an  ablation on the left greater saphenous vein. I'll update her reflux studies perhaps ever reviewed by vein and vascular. We've made absolutely no progress in these wounds. Will also try to read and TheraSkins through her insurance 11/06/17; W the patient apparently has a 2 week follow-up with vein and vascular I like him to review the whole issue with regards to her previous vascular workup by Dr. Aleda Grana. We've really made no progress on these wounds in many months. She arrives today with less viable looking surface on the left medial ankle wound. This was apparently looking about the same on Tuesday when she was here for nurse visit. 11/13/17; deep tissue culture I did last time of the left lower leg showed multiple organisms without any predominating. In particular no Staphylococcus or group A strep were isolated. We sent her for venous reflux studies. She's had a previous left greater saphenous vein stripping and I think sclerotherapy of the right greater saphenous vein. She didn't really look at the lesser saphenous vein this both wounds are on the medial aspect. She has reflux in the common femoral vein and popliteal vein and an accessory vein on the right and the common femoral vein and popliteal vein on the left. I'm going to have her go to see vein and vascular just the look over things and see if anything else beside aggressive compression is indicated here. We have not been able to make any progress on these wounds in spite of the fact that the surface of the wounds is never look too bad. 11/20/17; no major change in the condition of the wounds. Patient reports a large amount of drainage. She has a lot of complaints of pain although enlisting her today I wonder if some of this at least his  neuropathic rather than secondary to her wounds. She has an appointment with vein and vascular on 12/30/17. The refractory nature of these wounds in my mind at least need vein and vascular to look over the wounds the recent reflux studies we did and her history to see if anything further can be done here. I also note her gait is deteriorated quite a bit. 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 She has a bilateral Trendelenburg gait. I wonder if this is neuropathic or perhaps multilevel radicular. 11/27/17; her wounds actually looks slightly better. Healthy-looking granulation tissue a scant amount of epithelialization. [place] healthcare will not pay for [employer]. They will play for tri layer Oasis and Dermagraft. This is not a diabetic ulcer. We'll try for the tri layer Oasis. She still complains of some drainage. She has a vein and vascular appointment on 12/30/17 12/04/17; the wounds visually look quite good. Healthy-looking granulation with some degree of epithelialization. We are still waiting for response to our request for trial to try layer Oasis. Her appointment with vascular to review venous and arterial issues isn't sold the end of July 7/31. Not allow debridement or even vigorous cleansing of the wound surface. 12/18/17; slightly smaller especially on the right. 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 We've been using Hydrofera Blue. We still are looking into triple layer Oasis through her insurance 01/08/18 on evaluation today patient's wound actually appears to be showing signs of good improvement at this point in time. She has been tolerating the dressing changes without complication. Fortunately there does not appear to be any evidence of infection at this point in time. We have been utilizing silver nitrate which does seem to be  of benefit for her which is also good news. Overall I'm very happy with how things seem to be both regards appearance as  well as measurement. Patient did see Dr. Bridgett Larsson for evaluation on 12/30/17. In his assessment he felt that stripping would not likely add much more than chronic compression to the patient's healing process. His recommendation was to follow-up in three months with Dr. Doren Custard if she hasn't healed in order to consider referral back to you and see vascular where she previously was in a trial and was able to get her wound to heal. I'll be see what she feels she when you staying compression and he reiterated this as well. 01/13/18 on evaluation today patient appears to actually be doing very well in regard to her bilateral medial malleolus ulcers. She seems to have tolerated the chemical cauterization with silver nitrate last week she did have some pain through that evening but fortunately states that I'll be see since it seems to be doing better she is overall pleased with the progress. 01/21/18; really quite a remarkable improvement since I've last seen these wounds. We started using silver nitrate specially on the islands of hyper granulation which for some reason her around the wound circumference. This is really done quite nicely. Primary dressing Hydrofera Blue under 4 layer compression. She seems to be able to hold out without a nurse rewrap. Follow-up in 1 week 01/28/18; we've continued the hydrofera blue but continued with chemical cauterization to the wound area that we started about a month ago for irregular hyper granulation. She is made almost stunning improvement in the overall wound dimensions. I was not really expecting this degree of improvement in these chronic wounds 02/05/18; we continue with Hydrofera Bluebut of also continued the aggressive chemical cauterization with silver nitrate. We made nice progress with the right greater than left wound. 02/12/18. We continued with Hydrofera Blue after aggressive chemical cauterization with silver nitrate. We appear to be making nice progress with both  wound areas 02/19/2018; we continue with [facility] after washing the wounds vigorously with Anasept spray and chemical cauterization with silver nitrate. We are making excellent progress. The area on the right's just about closed 02/26/2018. The area on the left medial ankle had too much necrotic debris today. I used a #5 curette we are able to get most of the soft. I continued with the silver nitrate to the much smaller wound on the right medial ankle she had a new area on her right lower pretibial area which she says was due to a role in her compression 03/05/2018; both wound areas look healthy. Not much change in dimensions from last week. I continue to use silver nitrate and Hydrofera Blue. The patient saw Dr. Doren Custard of vein and vascular. He felt she had venous stasis ulcers. He felt based on her previous arteriogram she should have adequate circulation for healing. Also she has deep venous reflux but really no significant correctable superficial venous reflux at this time. He felt we should continue with conservative management including leg elevation and compression 04/02/2018; since we last saw this woman about a month ago she had a fall apparently suffered a pelvic fracture. I did not look up the x-ray. Nevertheless because of pain she literally was bedbound for 2 weeks and had home health coming out to change the dressing. Somewhat predictably this is resulted in considerable improvement in both wound areas. The right is just about closed on the medial malleolus and the left is about  half the size. 04/16/2018; both her wounds continue to go down in size. Using Hydrofera Blue. 05/07/18; both her wounds appeared to be improving especially on the right where it is almost closed. We are using Hydrofera Blue 05/14/2018; slightly worse this week with larger wounds. Surface on the left medial not quite as good. We have been using Hydrofera Blue 05/21/18; again the wounds are slightly larger. Left  medial malleolus slightly larger with eschar around the circumference. We have been using Hydrofera Blue undergoing a wraps for a prolonged period of time. This got a lot better when she was more recumbent due to a fall and a back injury. I change the primary dressing the silver alginate today. She did not tolerate a 4 layer compression previously although I may need to bring this up with her next time 05/28/2018; area on the left medial malleolus again is slightly larger with more drainage. Area on the right is roughly unchanged. She has a small area of folliculitis on the right medial just on the lower calf. This does not look ominous. 06/03/2018 left medial malleolus slightly smaller in a better looking surface. We used silver nitrate on this last time with silver alginate. The area on the right appears slightly smaller 1/10; left medial malleolus slightly smaller. Small open area on the right. We used silver nitrate and silver alginate as of 2 weeks ago. We continue with the wound and compression. These got a lot better when she was off her feet 1/17; right medial malleolus wound is smaller. The left may be slightly smaller. Both surfaces look somewhat better. 1/24; both wounds are slightly smaller. Using silver alginate under Unna boots 1/31; both wounds appear smaller in fact the area on the right medial is just about closed. Surface eschar. We have been using silver alginate under Unna boots. The patient is less active now spends let much less time on her feet and I think this is contributed to the general improvement in the wound condition 2/7; both wounds appear smaller. I was hopeful the right medial would be closed however there there is still the same small open area. Slight amount of surface eschar on the left the dimensions are smaller there is eschar but the wound edges appear to be free. We have been using silver alginate under Unna boot's 2/14; both wounds once again measure smaller.  Circumferential eschar on the left medial. We have been using silver alginate under Unna boots with gradual improvement 2/21; the area on the right medial malleolus has healed. The area on the left is smaller. We have been using silver alginate and Unna boots. We can discharge wrapping the right leg she has 20/30 stockings at home she will need to protect the scar tissue in this area 2/28; the area on the right medial malleolus remains closed the patient has a compression stocking. The area on the left is smaller. We have been using silver alginate and Unna boots. 3/6 the area on the right medial ankle remains closed. Good edema control noted she is using her own compression stocking. The area on the left medial ankle is smaller. We have been managing this with silver alginate and Unna boots which we will continue today. 3/13; the area on the right medial ankle remains closed and I'm declaring it healed today. When necessary the left is about the same still a healthy-looking surface but no major change and wound area. No evidence of infection and using silver alginate under unna and generally making  considerable improvement 3/27 the area on the right medial ankle remains closed the area on the left is about the same as last week. Certainly not any worse we have been using silver alginate under an Unna boot 4/3; the area on the right medial ankle remains closed per the patient. We did not look at this wound. The wound on the left medial ankle is about the same surface looks healthy we have been using silver alginate under an Unna boot 4/10; area on the right medial ankle remains closed per the patient. We did not look at this wound. The wound on the left medial ankle is slightly larger. The patient complains that the [employer] caused burning pain all week. She also told us that she was a lot more active this week. Changed her back to silver alginate 4/17; right medial ankle still closed per the  patient. Left medial ankle is slightly larger. Using silver alginate. She did not tolerate Hydrofera Blue on this area 4/24; right medial ankle remains closed we have not look at this. The left medial ankle continues to get larger today by about a centimeter. We have been using silver alginate under Unna boots. She complains about 4 layer compression as an alternative. She has been up on her feet working on her garden 5/8; right medial ankle remains closed we did not look at this. The left medial ankle has increased in size about 100%. We have been using silver alginate under Unna boots. She noted increased pain this week and was not surprised that the wound is deteriorated 5/15; no major change in SA however much less erythema ( one week of doxy ocellulitis). 5/22-65 year old female returns at 1 week to the clinic for left medial ankle wound for which we have been using silver alginate under 3 layer compression She was placed on DOXY at last visit - the wound is wider at this visit. She is in 3 layer compression 5/29; change to [facility] last week. I had given her empiric doxycycline 2 weeks ago for a week. She is in 3 layer compression. She complains of a lot of pain and drainage on presentation today. 6/5; using Hydrofera Blue. I gave her doxycycline recently empirically for erythema and pain around the wound. Believe her cultures showed enterococcus which not would not have been well covered by doxycycline nevertheless the wound looks better and I don't feel specifically that the enterococcus needs to be covered. She has a new what looks like a wrap injury on her lateral left ankle. 6/12; she is using Hydrofera Blue. She has a new area on the left anterior lower tibial area. This was a wrap injury last week. 6/19; the patient is using Hydrofera Blue. She arrived with marked inflammation and erythema around the wound and tenderness. 12/01/18 on evaluation today patient appears to be doing a  little bit better based on what I'm hearing from the standpoint of lassos evaluation to this as far as the overall appearance of the wound is concerned. Then sometime substandard she typically sees Dr. Dellia Nims. Nonetheless overall very pleased with the progress that she's made up to this point. No fevers, chills, nausea, or vomiting noted at this time. 7/10; some improvement in the surface area. Aggressively debrided last week apparently. I went ahead with the debridement today although the patient does not tolerate this very well. We have been using Iodoflex. Still a fair amount of drainage 7/17; slightly smaller. Using Iodoflex. 7/24; no change from last week in  terms of surface area. We have been using Iodoflex. Surface looks and continues to look somewhat better 7/31; surface area slightly smaller better looking surface. We have been using Iodoflex. This is under Unna boot compression 8/7-Patient presents at 1 week with Unna boot and Iodoflex, wound appears better 8/14-Patient presents at 1 week with Iodoflex, we use the Unna boot, wound appears to be stable better.Patient is getting Botox treatment for the inversion of the foot for tendon release, Next week 8/21; we are using Iodoflex. Unna boot. The wound is stable in terms of surface area. Under illumination there is some areas of the wound that appear to be either epithelialized or perhaps this is adherent slough at this point I was not really clear. It did not wipe off and I was reluctant to debride this today. 8/28; we are using Iodoflex in an Unna boot. Seems to be making good improvement. 9/4; using Iodoflex and wound is slightly smaller. 9/18; we are using Iodoflex with topical silver nitrate when she is here. The wound continues to be smaller 10/2; patient missed her appointment last week due to GI issues. She left and Iodoflex based dressing on for 2 weeks. Wound is about the same size about the size of a dime on the left medial  lower 10/9 we have been using Iodoflex on the medial left ankle wound. She has a new superficial probable wrap injury on the dorsal left ankle 10/16; we have been using Hydrofera Blue since last week. This is on the left medial ankle 10/23; we have been using Hydrofera Blue since 2 weeks ago. This is on the left medial ankle. Dimensions are better 11/6; using Hydrofera Blue. I think the wound is smaller but still not closed. Left medial ankle 11/13; we have been using Hydrofera Blue. Wound is certainly no smaller this week. Also the surface not as good. This is the remanent of a very large area on her left medial ankle. 11/20; using Sorbact since last week. Wound was about the same in terms of size although I was disappointed about the surface debris 12/11; 3-week follow-up. Patient was on vacation. Wound is measuring slightly larger we have been using Sorbact. 12/18; wound is about the same size however surface looks better last week after debridement. We have been using Sorbact under compression 1/15 wound is probably twice the size of last time increased in length nonviable surface. We have been using Sorbact. She was running a mild fever and missed her appointment last week 1/22; the wound is come down in size but under illumination still a very adherent debris we have been Hydrofera Blue that I changed her to last week 1/29; dimensions down slightly. We have been using Hydrofera Blue 2/19 dimensions are the same however there is rims of epithelialization under illumination. Therefore more the surface area may be epithelialized 2/26; the patient's wound actually measures smaller. The wound looks healthy. We have been using Hydrofera Blue. I had some thoughts about running Apligraf then I still may do that however this looks so much better this week we will delay that for now 3/5; the wound is small but about the same as last week. We have been using Hydrofera Blue. No debridement is required  today. 3/19; the wound is about the size of a dime. Healthy looking wound even under illumination. We have been using Hydrofera Blue. No mechanical debridement is necessary 3/26; not much change from last week although still looks very healthy. We have been using Hydrofera  Blue under [employer] Patient was offered an ankle fusion by podiatry but not until the wound heals with a proceed with this. 4/9; the patient comes in today with her original wound on the medial ankle looking satisfactory however she has some uncontrolled swelling in the middle part of her leg with 2 new open areas superiorly just lateral to the tibia. I think this was probably a wrap issue. She said she felt uncomfortable during the week but did not call in. We have been using Hydrofera Blue 4/16; the wound on the medial ankle is about the same. She has innumerable small areas superior to this across her mid tibia. I think this is probably folliculitis. She is also been working in the yard doing a lot of sweating 4/30; the patient issue on the upper areas across her mid tibia of all healed. I think this was excessive yard work if I remember. Her wound on the medial ankle is smaller. Some debris on this we have been using Hydrofera Blue under Unna boots 5/7; mid tibia. She has been using Hydrofera Blue under an Unna wrap. She is apparently going for her ankle surgery on June 3 10/28/19-Patient returns to clinic with the ankle wound, we are using Hydrofera Blue under Unna wrap, surgery is scheduled for her left foot for June 3 so she will be back for nurse visit next week READMISSION 01/17/2020 [name] is a 65 year old woman we have had in this clinic for a long period of time with severe venous hypertension and refractory wounds on her medial lower legs and ankles bilaterally. 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 When she was off her legs for a prolonged period example when she fell and suffered a compression fracture things would heal up quite nicely. She is now retired and we managed to heal up the right medial leg wound. The left one was very tiny last time I saw this although still refractory. 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 It got to the point where this was interfering with her walking and she elected to proceed with a ankle arthrodesis to straighten her her ankle and leave her with a functional outcome for mobilization. The patient was referred to Dr. Doren Custard and really this took some time to arrange. Dr. Doren Custard saw her on 12/07/2019. Once again he verified that she had no arterial issues. She had previously had an angiogram several years ago. Follow-up ABIs on the left showed an ABI of 1.12 with triphasic waveforms and a TBI of 0.92. She is felt to have chronic deep venous insufficiency but I do not think it was felt that anything could be done from about this from an ablation point of view. At the time Dr. Doren Custard saw this patient the wounds actually look closed via the pictures in his clinic. The patient finally underwent her surgery on 12/15/2019. This went reasonably well and there was a good anatomic outcome. She developed a small distal wound dehiscence on the lateral part of the surgical wound. However more problematically she is developed recurrence of the wound on the medial left ankle. There are actually 2 wounds here one in the distal lower leg and 1 pretty much at the level of the medial malleolus. It is a more distal  area that is more problematic. She has been using Hydrofera Blue which started on Friday before this she was simply Ace wrapping. There was a culture done that showed Pseudomonas and she is on ciprofloxacin. A recent CNS on 8/11 was negative. The patient reports some  pain but I generally think this is improving. She is using a cam boot completely nonweightbearing using a walker for pivot transfers and a wheelchair 8/24; not much improvement unfortunately she has a surgical wound on the lateral part in the venous insufficiency wound medially. The bottom part of the medial insufficiency wound is still necrotic there is exposed tendon here. We have been using Hydrofera Blue under compression. Her edema control is however better 8/31; patient in for follow-up of his surgical wound on the lateral part of her left leg and chronic venous insufficiency ulcers medially. We put her back in compression last week. She comes in today with a complaint of 3 or 4 days [name] of increasing pain. She felt her cam walker was rubbing on the area on the back of her heel. However there is intense erythema seems more likely she has cellulitis. She had 2 cultures done when she was seeing podiatry in the postop. One of them in late July showed Pseudomonas and she received a course of ciprofloxacin the other was negative on 8/11 she is allergic to penicillin with anaphylactoid complaints of hives oral swelling via information in epic 9/9; when I saw this patient last week she had intense anterior erythema around her wound on the right lateral heel and ankle and also into the right medial heel. Some of this was no doubt drainage and her walker boot however I was convinced she had cellulitis. I gave her Levaquin and Bactrim she is finishing up on this now. She is following up with Dr. [name] he saw her yesterday. He is taken her out of the walking boot of course she is still nonweightbearing. Her x-ray was negative for any worrisome features such as soft tissue air etc. Things are a lot better this week. She has home health. We have been using Hydrofera Blue under an [employer] which she put back on yesterday. I did not wrap her last week 9/17; her surrounding skin looks a lot better. In fact  the area on the left lateral ankle has just a scant amount of eschar. The only remaining wound is the large area on the left medial ankle. Probably about 60% of this is healthy granulation at the surface however she has a significant divot distally. This has adherent debris in it. I been using debridement and silver collagen to try and get this area to fill-in although I do not think we have made much progress this week 9/24; the patient's wound on the left medial ankle looks a lot better. The deeper divot area distally still requires debridement but this is cleaning up quite nicely we have been using silver collagen. The patient is complaining of swelling in her foot and is worried that that is contributing to the nonhealing of the ankle wound. She is also complaining of numbness in her anterior toes 10/4; left medial ankle. The small area distally still has a divot with necrotic material that I have been debriding away. This has an undermining area. She is approved for Apligraf. She saw Dr. [name] her surgeon on 10/1. I think he declared himself is satisfied with the condition of things. Still nonweightbearing till the end of the month. We are dealing with  the venous insufficiency wounds on the medial ankle. Her surgical wound is well closed. There is no evidence of infection 10/11; the patient arrived in clinic today with the expectation that we be able to put an Apligraf on this area after debridement however she arrives with a relatively large amount of green drainage on the dressing. The patient states that this started on Friday. She has not been systemically unwell. 10/19; culture I did last week showed both Enterococcus and Pseudomonas. I think this came in separate parts because I stopped her ciprofloxacin I gave her and prescribed her linezolid however now looking at the final culture result this is Pseudomonas which is resistant to quinolones. She has not yet picked up the linezolid  apparently phone issues. We are also trying to get a topical antibiotic out of [facility] in [place] they can be applied by home health. She is still having green drainage 10/16; the patient has her topical antibiotic from [facility] in [place]. This is a compounded gel with vancomycin and ciprofloxacin and gentamicin. We are applying this on the wound bed with silver alginate over the top with Unna boot wraps. She arrives in clinic today with a lot less ominous looking drainage although she is only use this topical preparation once the second time today. She sees Dr. [name] her surgeon on Friday she has home health changing the dressing 11/2; still using her compounded topical antibiotic under silver alginate. Surface is cleaning up there is less drainage. We had an Apligraf for her today and I elected to apply it. A light coating of her antibiotic 04/25/2020 upon evaluation today patient appears to be doing well with regard to her ankle ulcer. There is a little bit of slough noted on the surface of the wound I am can have to perform sharp debridement to clear this away today. With that being said other than that fact overall I feel like she is making progress and we do see some new epithelial growth. There is also some improvement in the depth of the wound and that distal portion. There is little bit of slough there as well. 12/7; 2-week follow-up. Apligraf #3. Dimensions are smaller. Closing in especially inferiorly. Still some surface debris. Still using the [facility] topical antibiotic but I told her that I don't think this needs to be renewed 12/21; 2-week follow-up. Apligraf #4 dimensions are smaller. Nice improvement 06/05/2020; 2-week follow-up. The patient's wound on the left medial ankle looks really excellent. Nice granulation. Advancing epithelialization no undermining no evidence of infection. We would have to reapply for another Apligraf but with the condition of this wound I  did not feel strongly about it. We used Hydrofera Blue under the same degree of compression. She follows up with Dr. [name] her surgeon a week Friday 06/13/2020 upon evaluation today patient appears to be doing excellent in regard to her wound. She has been tolerating the dressing changes without complication. Fortunately there is no signs of active infection at this time. No fevers, chills, nausea, vomiting, or diarrhea. She was using Hydrofera Blue last week. 06/20/2020 06/20/2020 on evaluation today patient actually appears to be doing excellent in regard to her wound. This is measuring better and looking much better as well. She has been using the collagen that seems to be doing better for her as well even though the [facility] was and is not sticking or feeling as rough on her wound. She did see Dr. [name] on Friday he is very pleased he  also stated none of the hardware has shifted. That is great news 1/27; the patient has a small clean wound all that is remaining. I agree that this is too small to really consider an Apligraf. Under illumination the surface is looking quite good. We have been using collagen although the dimensions are not any better this week 2/2; the patient has a small clean wound on the left medial ankle. Although this left of her substantial original areas. Measurements are smaller. We have been using polymen Ag under an Haematologist. 2/10; small area on the left medial ankle. This looks clean nothing to debride however dimensions are about the same we have been using polymen I think now for 2 weeks 2/17; not much change in surface area. We have been using polymen Ag without any improvement. 3/17; 1 month follow-up. The patient has been using endoform without any improvement in fact I think this looks worse with more depth and more expansion 3/24; no improvement. Perhaps less debris on the surface. We have been using Sorbact for 1 week 4/4; wound measures larger. She has edema  in her leg and her foot which she tells as her wrap came down. We have been using Unna boots. Sorbact of the wound. She has been approved for Apligraf 09/12/2020 upon evaluation today patient appears to be doing well with regard to her wound. We did get the Apligraf reapproved which is great news we have that available for application today. Fortunately there is no signs of infection and overall the patient seems to be doing great. The wound bed is nice and clean. 4/27; patient presents for her second application of Apligraf. She states over the past week she has been on her feet more often due to being outside in her garden. She has noted more swelling to her foot as a result. She denies increased warmth, pain or erythema to the wound site. 10/10/2020 upon evaluation today patient appears to be doing well with regard to her wound which does not appear to be quite as irritated as last week from what I am hearing. With that being said unfortunately she is having issues with some erythema and warmth to touch as well as an increase size. I do believe this likely is infected. 10/17/2020 upon evaluation today patient appears to be doing excellent in regard to her wound this is significantly improved as compared to last week. Fortunately I think that the infection is much better controlled at this point. She did have evidence of both Enterococcus as well as Staphylococcus noted on culture. Enterococcus really would not be helped significantly by the Cipro but the wound is doing so much better I am under the assumption that the Staphylococcus is probably the main organism that is causing the current infection. Nonetheless I think that she is doing excellent as far as that is concerned and I am very pleased in that regard. I would therefore recommend she continue with the Cipro. 10/31/2020 upon evaluation today patient appears to be doing well with regard to her wound. She has been tolerating the dressing changes  without complication. Fortunately there is no signs of active infection and overall I am extremely pleased with where things stand today. No fevers, chills, nausea, vomiting, or diarrhea. With that being said she does have some green drainage coming from the wound and although it looks okay I am a little concerned about the possibility of a continuing infection. Specifically with Pseudomonas. For that reason I will go ahead and  send in a prescription for Cipro for her to be continued. 11/14/2020 upon evaluation today patient appears to be doing very well currently in regard to her wound on her leg. She has been tolerating the dressing changes without complication. Fortunately I feel like the infection is finally under good control here. Unfortunately we do not have the Apligraf for application today although we can definitely order to have it in place for next week. That will be her fifth and final of the current series. Nonetheless I feel like her wound is really doing quite well which is great news. 11/21/2020 upon evaluation today patient appears to be doing well with regard to her wound on the medial ankle. Fortunately I think the infection is under control and I do believe we can go ahead and reapply the Apligraf today. She is in agreement with that plan. There does not appear to be any signs of active infection at this time which is great news. No fevers, chills, nausea, vomiting, or diarrhea. 12/05/2020 upon evaluation today patient's wound bed actually showed signs of good granulation epithelization at this point. There does not appear to be any signs of infection which is great news and overall very pleased with where things stand. Overall the patient seems to be doing fairly well in my opinion with regard to her wound although I do believe she continues to build up a lot of biofilm I think she could benefit from using PuraPly at this point. 12/12/2020 upon evaluation today patient's wound actually  appears to be doing decently well today. The Unna boot has not been quite as well-tolerated so that more uncomfortable for her and even causing some pressure over the plate on the lateral portion of her foot which is 90 where the wound is. There did not appear to be any significant deep tissue injury with that there may be a minimal change in the skin noted I think that we may want to go back to the Coflex 2 layer which is a little bit easier on her skin it seems. 12/19/2020 upon evaluation today patient actually seems to be making great progress with the PuraPly currently. She in fact seems to be much better as far as the overall appearance of the wound bed is concerned I am very happy in this regard. I do not see any signs of of infection which is great news as well. No fevers, chills, nausea, vomiting, or diarrhea. 12/26/2020 upon evaluation today patient appears to actually be doing better in regard to her wound on the left medial ankle region. The surface of the wound is actually doing significantly better which is great news. There does not appear to be any signs of infection which is also great news and in general I am extremely pleased with where we stand today. 01/02/2021 upon evaluation today patient appears to be doing well with regard to her wound. In fact this is showing signs of excellent improvement and very pleased with where things stand. In fact the last 3 appointments have all shown signs of this getting smaller which is excellent news. I have not even had to perform any debridement and today is no exception. Overall I feel like this is dramatically improved compared to previous. T oday is PuraPly application #4. 9/47/6546 upon evaluation today patient appears to be doing excellent in regard to her wound this is continue to show signs of improvement and overall I am extremely pleased with where we stand today. She is actually here for  PuraPly application #5. Every time we have applied  this she is noted definite improvement on measurements. 01/23/2021 upon evaluation today patient is actually making good progress in regard to her wound. This was actually on just a little bit longer this time compared to previous due to the fact that she did have to go out of town. She is actually here for PuraPly application #6. We have definitely been seeing improvements in the overall quality of the tissue on the surface of the wound which is awesome news. In general I think that the patient seems to be continuing to make great progress here. 01/30/2021 upon evaluation today patient's wound is actually doing excellent. There is really not any significant biofilm buildup which is great news and overall I am extremely pleased with where things stand today. There does not appear to be any signs of active infection. No fevers, chills, nausea, vomiting, or diarrhea. 02/06/2021 upon evaluation today patient's wound is actually showing signs of excellent improvement which is great news. We continue to see the benefit of the PuraPly this is doing a great job the wound seems not really irritated whatsoever and is showing signs of good granulation at this point. Overall I am extremely happy with what we are seeing. The patient likewise is happy to hear all of this as well. 02/13/2021 upon evaluation today patient appears to be doing well with regard to her wound. This again is measuring smaller today and I am very pleased in this regard. Fortunately there does not appear to be any signs of active infection at this time which is good news from a systemic standpoint. Locally there is still some significant drainage which she does have is concerned about infection locally. No fevers, chills, nausea, vomiting, or diarrhea. 02/20/2021 upon evaluation today patient actually appears to be doing decently well in regard to her wound. She has been tolerating the dressing changes without complication. I do believe the  PuraPly is helping wound bed does appear to be doing better. There is no evidence of active infection locally or systemically at this point visually although on fluorescence imaging there still appear to be bacterial load and bioburden noted. 02/27/2021 upon evaluation today patient fortunately continues to show signs of improvement with use of the PuraPly currently. Subsequently we did review her culture results and to be honest I think that the Bactrim is probably the best option to have her continue at this point. For that reason I am get a go ahead and send in a refill today for her for an additional 2 weeks. Nonetheless I think that we are making excellent progress here. It was Enterococcus and Staphylococcus that were noted she is allergic to penicillin so there is not much I can do from the Enterococcus standpoint though the staff does seem to be sensitive to the Bactrim. 03/06/2021 upon evaluation today patient appears to be doing well with regard to her wound. This is definitely showing signs of improvement which is great news and overall I am extremely pleased with where things stand today. There does not appear to be any signs of active infection which is great news and overall and I do believe that we are headed in the appropriate direction I think the PuraPly is doing an awesome job for her. 03/20/2021 upon evaluation today patient actually appears to be doing well with regard to her wound. We have been using the PuraPly although last week when I was out of the office they actually just used  endoform nonetheless this still seems to be doing great. Fortunately there does not appear to be any signs of active infection which is great news overall I think the patient is making wonderful progress. 03/27/2021 upon evaluation today patient appears to be doing well with regard to her wound in fact there is a little drainage but otherwise her does not appear to be any major signs of open wounds nor  infection at this time. Overall I am extremely pleased with where things stand. No fevers, chills, nausea, vomiting, or diarrhea. 04/03/2021 upon evaluation today patient presents for reevaluation here in the clinic and overall she seems to be doing quite well. Fortunately there does not appear to be any signs of active infection which is great news and in general I am very pleased with how things appear. There is still a very small opening remaining but in general she is looking much better. Fortunately I do not think there is any need for the PuraPly at this point. 04/10/2021 upon evaluation today patient's wound is actually showing signs of excellent improvement. I am actually very pleased with where it stands today. I think she is very close to complete closure in fact this may be healed today. Either way I think that she may benefit from using her own compression socks for the next week and then will subsequently see how things stand following. 04/17/2021 upon evaluation today patient appears to be doing well with regard to her wound on the ankle region. This is actually showing signs of excellent improvement which is great news. I do not see any signs of active infection at this time which is great and no open wound. With that being said on the lateral portion of her ankle right at the base of her plate she actually has an area of pressure that occurred over the past week when she was not in the wrap or we been padding her. This does seem to be an issue which I think is good to be an ongoing problem I am can actually get in touch with Dr. [name] today to see what we can do in that regard. 04/24/2021 upon evaluation today patient appears to be doing well with regard to her ankle region for the most part although there does appear to be some fluid trapped underneath the area where we thought this was healed last week. Unfortunately I think at this time that we can have to reopen and clean away some  of this area and then subsequently depending on how things do over the next week or so we will see about discharge but right now I do not think that is the right way to go. She voiced understanding. 05/01/2021 upon evaluation today patient's wound still appears to be open unfortunately. I do believe that we do need to see what we can do about trying to get this closed I believe compression wrapping is probably can to be indicated here. Fortunately there is no signs of active infection at this time. 05/08/2021 upon evaluation today patient appears to be doing okay in regard to her ulcer this is very superficial and we will see any major issues here. The biggest problem currently that I find is that we just cannot get this area to completely seal up. She seemed to almost be and then things digressed a little bit. Right now is not too bad but we do need to see if we can make some difference here. I think maybe switching over to collagen  may be better. 05/15/2021 upon evaluation today patient actually appears to be doing pretty well in regard to her wound. This is measuring a little smaller and looking much better. She did see orthopedics today there to me making her a insert for her [name] to try to help straighten out her gait. I think this can be beneficial for her as well. 05/22/2021 upon evaluation today patient appears to be doing well currently in regard to her wound although is not significantly smaller unfortunately this at least does not show any signs of active infection at this time which is great news. No fevers, chills, nausea, vomiting, or diarrhea. I am going to see about switching over to endoform that is what we wanted to use in the past always been on backorder. Electronic Signature(s) Signed: 05/22/2021 11:30:43 AM By: [name] PA-C Entered By: Worthy [name] on 05/22/2021 11:30:43 -------------------------------------------------------------------------------- Physical Exam  Details Patient Name: Date of Service: [facility] [name], [name] NO R [name]. 05/22/2021 9:30 A M Medical Record Number: [id number] Patient Account Number: [id number] Date of Birth/Sex: Treating RN: 02-Apr-1956 (65 y.o. [name] Primary Care Provider: Lennie [name] Other Clinician: Referring Provider: Treating Provider/Extender: [name] in Treatment: 10 Constitutional Well-nourished and well-hydrated in no acute distress. Respiratory normal breathing without difficulty. Psychiatric this patient is able to make decisions and demonstrates good insight into disease process. Alert and Oriented x 3. pleasant and cooperative. Notes Upon inspection patient's wound bed actually showed signs of good granulation and epithelization at this point. Fortunately I do not see any signs of infection which is great news and overall I am very pleased. I am going to perform some debridement to clear away the necrotic debris he tolerated that today without complication postdebridement wound bed appears to be doing much better which is great news. Electronic Signature(s) Signed: 05/22/2021 11:31:06 AM By: [name] PA-C Entered By: Worthy [name] on 05/22/2021 11:31:05 -------------------------------------------------------------------------------- Physician Orders Details Patient Name: Date of Service: [facility] [name], [name] NO R [name]. 05/22/2021 9:30 A M Medical Record Number: [id number] Patient Account Number: [id number] Date of Birth/Sex: Treating RN: Jan 10, 1956 (65 y.o. 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 Margarita Grizzle Wednesday 06/05/2021 Return A Nurse Visit: - Wednesday 05/29/2021 Edema Control - Lymphedema / SCD / Other Bilateral Lower Extremities Patient to wear own compression stockings every day. Exercise regularly Moisturize legs daily. Non Wound Condition pply the following to affected area as directed: - Apply silicone bordered foam to left lateral foot for protection A Wound Treatment Wound #15R - Malleolus Wound Laterality: Left, Medial Cleanser: Wound Cleanser (Generic) 1 x Per Day/15 Days Discharge Instructions: Cleanse the wound with wound cleanser prior to applying a clean dressing using gauze sponges, not tissue or cotton balls. Prim Dressing: Endoform 2x2 in 1 x Per Day/15 Days ary Discharge Instructions: Moisten with saline Prim Dressing: adaptic 1 x Per Day/15 Days ary Discharge Instructions: apply adaptic over the endoform Secondary Dressing: Woven Gauze Sponge, Non-Sterile 4x4 in (Generic) 1 x Per Day/15 Days Discharge Instructions: Apply over primary  dressing as directed. Compression Wrap: CoFlex TLC XL 2-layer Compression System 4x7 (in/yd) 1 x Per Day/15 Days Discharge Instructions: Apply CoFlex 2-layer compression as directed. (alt for 4 layer) Electronic Signature(s) Signed: 05/22/2021 5:18:31 PM By: [name] PA-C Signed: 05/22/2021 6:10:28 PM By: [name] Pilling RN, BSN Entered By: [name] on 05/22/2021 11:16:57 -------------------------------------------------------------------------------- Problem List Details Patient Name: Date of Service: [name]. 05/22/2021 9:30 A M Medical Record Number: [id number] Patient Account Number: [id number] Date of Birth/Sex: Treating RN: July 01, 1955 (65 y.o. 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 01/17/2020 No Yes Inactive Problems ICD-10 Code Description Active Date Inactive Date L03.116 Cellulitis of left lower limb 01/31/2020 01/31/2020 T81.31XD Disruption of external operation (surgical) wound, not elsewhere classified, subsequent 01/17/2020 01/17/2020 encounter Resolved Problems Electronic Signature(s) Signed: 05/22/2021 11:30:09 AM By: [name] PA-C Entered By: Worthy [name] on 05/22/2021 11:30:08 -------------------------------------------------------------------------------- Progress Note Details Patient Name: Date of Service: [facility] [name], [name] NO R [name]. 05/22/2021 9:30 A M Medical Record Number: [id number] Patient Account Number: [id number] Date of Birth/Sex: Treating RN: November 03, 1955 (65 y.o. [name] Primary Care Provider: Lennie [name] Other Clinician: Referring Provider: Treating Provider/Extender: [name] in Treatment: 54 Subjective Chief Complaint Information obtained from Patient patient is been followed long-term in this clinic for venous insufficiency ulcers with inflammation, hypertension and ulceration over the medial ankle bilaterally. 01/17/2020; this is a patient who is here for review of postoperative wounds on the left lateral ankle and recurrence of venous stasis ulceration on the left medial History of Present Illness (HPI) the remaining wound is over the left medial ankle. Similar wound over the right medial ankle healed largely with  use of Apligraf. Most recently we have been using Hydrofera Blue over this wound with considerable improvement. The patient has been extensively worked up in the past for her venous insufficiency and she is not a candidate for antireflux surgery although I have none of the details available currently. 08/24/14; considerable improvement today. About 50% of this wound areas now epithelialized. The base of the wound appears to be healthier granulation.as opposed to last week when she had deteriorated a considerable improvement 08/17/14; unfortunately the wound has regressed somewhat. The areas of epithelialization from the superior aspect are not nearly as healthy as they were last week. The patient thinks her Hydrofera Blue slipped. 09/07/14; unfortunately the area has markedly regressed in the 2 weeks since I've seen this. There is an [name] surrounding erythema. The healthy granulation tissue that we had at the base of the wound now is a dusky color. The nurse reports green drainage 09/14/14; the area looks somewhat better than last week. There is less erythema and less drainage. The culture I did did not show any growth. Nevertheless I think it is better to continue the Cipro and doxycycline for a further week. The remaining wound area was debridement. 09/21/14. Wound did not require debridement last week. Still less erythema and less drainage. She can complete her antibiotics. The areas of epithelialization in the superior aspect of the  wound do not look as healthy as they did some weeks ago 10/05/14 continued improvement in the condition of this wound. There is advancing epithelialization. Less aggressive debridement required 10/19/14 continued improvement in the condition and volume of this wound. Less aggressive debridement to the inferior part of this to remove surface slough and fibrinous eschar 11/02/14 no debridement is required. The surface granulation appears healthy although some of her islands of  epithelialization seem to have regressed. No evidence of infection 11/16/14; lites surface debridement done of surface eschar. The wound does not look to be unhealthy. No evidence of infection. Unfortunately the patient has had podiatry issues in the right foot and for some reason has redeveloped small surface ulcerations in the medial right ankle. Her original presentation involved wounds in this area 11/23/14 no debridement. The area on the right ankle has enlarged. The left ankle wound appears stable in terms of the surface although there is periwound inflammation. There has been regression in the amount of new skin 11/30/14 no debridement. Both wound areas appear healthy. There was no evidence of infection. The the new area on the right medial ankle has enlarged although that both the surfaces appear to be stable. 12/07/14; Debridement of the right medial ankle wound. No no debridement was done on the left. 12/14/14 no major change in and now bilateral medial ankle wounds. Both of these are very painful but the no overt evidence of infection. She has had previous venous ablation 12/21/14; patient states that her right medial ankle wound is considerably more painful last week than usual. Her left is also somewhat painful. She could not tolerate debridement. The right medial ankle wound has fibrinous surface eschar 12/28/14 this is a patient with severe bilateral venous insufficiency ulcers. For a considerable period of time we actually had the one on the right medial ankle healed however this recently opened up again in June. The left medial ankle wound has been a refractory area with some absent flows. We had some success with Hydrofera Blue on this area and it literally closed by 50% however it is recently opened up Foley. Both of these were debridement today of surface eschar. She tolerates this poorly 01/25/15: No change in the status of this. Thick adherent escar. Very poor tolerance of any attempt  at debridement. I had healed the right medial malleolus wound for a considerable amount of time and had the left one down to about 50% of the volume although this is totally regressed over the last 48 weeks. Further the right leg has reopened. she is trying to make a appointment with pain and vascular, previous ablations with Dr. Aleda Grana. I do not believe there is an arterial insufficiency issue here 02/01/15 the status of the adherent eschar bilaterally is actually improved. No debridement was done. She did not manage to get vascular studies done 02/08/15 continued debridement of the area was done today. The slough is less adherent and comes off with less pressure. There is no surrounding infection peripheral pulses are intact 02/15/15 selective debridement with a disposable curette. Again the slough is less adherent and comes off with less difficulty. No surrounding infection peripheral pulses are intact. 02/22/15 selective debridement of the right medial ankle wound. Slough comes off with less difficulty. No obvious surrounding infection peripheral pulses are intact I did not debridement the one on the left. Both of these are stable to improved 03/01/15 selective debridement of both wound areas using a curette to. Adherent slough cup soft with less difficulty.  No obvious surrounding infection. The patient tells me that 2 days ago she noted a rash above the right leg wrap. She did not have this on her lower legs when she change this over she arrives with widespread left greater than right almost folliculitis-looking rash which is extremely pruritic. I don't see anything to culture here. There is no rash on the rest of her body. She feels well systemically. 03/08/15; selective debridement of both wounds using a curette. Base of this does not look unhealthy. She had limegreen drainage coming out of the left leg wound and describes a lot of drainage. The rash on her left leg looks improved to. No  cultures were done. 03/22/15; patient was not here last week. Basal wounds does not look healthy and there is no surrounding erythema. No drainage. There is still a rash on the left leg that almost looks vasculitic however it is clearly limited to the top of where the wrap would be. 04/05/15; on the right required a surgical debridement of surface eschar and necrotic subcutaneous tissue. I did not debridement the area on the left. These continue to be large open wounds that are not changing that much. We were successful at one point in healing the area on the right, and at the same time the area on the left was roughly half the size of current measurements. I think a lot of the deterioration has to do with the prolonged time the patient is on her feet at work 04/19/15 I attempted-like surface debridement bilaterally she does not tolerate this. She tells me that she was in allergic care yesterday with extreme pain over her left lateral malleolus/ankle and was told that she has an "sprain" 05/03/15; large bilateral venous insufficiency wounds over the medial malleolus/medial aspect of her ankles. She complains of copious amounts of drainage and his usual large amounts of pain. There is some increasing erythema around the wound on the right extending into the medial aspect of her foot to. historically she came in with these wounds the right one healed and the left one came down to roughly half its current size however the right one is reopened and the left is expanded. 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 05/10/15 large bilateral venous insufficiency wounds. There is less adherence surface left however the surface culture that I did last week grew pseudomonas therefore bilateral selective debridement score necessary. There is surrounding erythema. The patient describes severe bilateral drainage and a lot of pain in the left ankle. Apparently her  podiatrist was were ready to do a cortisone shot 05/17/15; the patient complains of pain and again copious amounts of drainage. 05/24/15; we used Iodo flex last week. Patient notes considerable improvement in wound drainage. Only needed to change this once. 05/31/15; we continued Iodoflex; the base of these large wounds bilaterally is not too bad but there is probably likely a significant bioburden here. I would like to debridement just doesn't tolerate it. 06/06/14 I would like to continue the Iodoflex although she still hasn't managed to obtain supplies. She has bilateral medial malleoli or large wounds which are mostly superficial. Both of them are covered circumferentially with some nonviable fibrinous slough although she tolerates debridement very poorly. She apparently has an appointment for an ablation on the right leg by interventional radiology. 06/14/15; the patient arrives with the wounds and static condition. We attempted a debridement although she does not do  well with this secondary to pain. I 07/05/15; wounds are not much smaller however there appears to be a cleaner granulating base. The left has tight fibrinous slough greater than the right. Debridement is tolerated poorly due to pain. Iodoflex is done more for these wounds in any of the multitude of different dressings I have tried on the left 1 and then subsequently the right. 07/12/15; no change in the condition of this wound. I am able to do an aggressive debridement on the right but not the left. She simply cannot tolerate it. We have been using Iodoflex which helps somewhat. It is worthwhile remembering that at one point we healed the right medial ankle wound and the left was about 25% of the current circumference. We have suggested returning to vascular surgery for review of possible further ablations for one reason or another she has not been able to do this. 07/26/15 no major change in the condition of either wound on her medial  ankle. I did not attempt to debridement of these. She has been aggressively scrubbing these while she is in the shower at home. She has her supply of Iodoflex which seems to have done more for these wounds then anything I have put on recently. 08/09/15 wound area appears larger although not verified by measurements. Using Iodoflex 09/05/2015 -- she was here for avisit today but had significant problems with the wound and I was asked to see her for a physician opinion. I have summarize that this lady has had surgery on her left lower extremity about 10 years ago where the possible veins stripping was done. She has had an opinion from interventional radiology around November 2016 where no further sclerotherapy was ordered. The patient works 12 hours a day and stands on a concrete floor with work boots and is unable to get the proper compression she requires and cannot elevate her limbs appropriately at any given time. She has recently grown Pseudomonas from her wound culture but has not started her ciprofloxacin which was called in for her. 09/13/15 this continues to be a difficult situation for this patient. At one point I had this wound down to a 1.5 x 1.5" wound on her left leg. This is deteriorated and the right leg has reopened. She now has substantial wounds on her medial calcaneus, malleoli and into her lower leg. One on the left has surface eschar but these are far too painful for me to debridement here. She has a vascular surgery appointment next week to see if anything can be done to help here. I think she has had previous ablations several years ago at [facility]. She has no major edema. She tells me that she did not get product last time [facility]] and went for several days without it. She continues to work in work boots 12 hours a day. She cannot get compression/4-layer under her work boots. 09/20/15 no major change. Periwound edema control was not very good. Her point with pain and  vascular is next Wednesday the 25th 09/28/15; the patient is seen vascular surgery and is apparently scheduled for repeat duplex ultrasounds of her bilateral lower legs next week. 10/05/15; the patient was seen by Dr. Doren Custard of vascular surgery. He feels that she should have arterial insufficiency excluded as cause/contributed to her nonhealing stage she is therefore booked for an arteriogram. She has apparently monophasic signals in the dorsalis pedis pulses. She also of course has known severe chronic venous insufficiency with previous procedures as noted previously.  I had another long discussion with the patient today about her continuing to work 12 hour shifts. I've written her out for 2 months area had concerns about this as her work location is currently undergoing significant turmoil and this may lead to her termination. She is aware of this however I agree with her that she simply cannot continue to stand for 12 hours multiple days a week with the substantial wound areas she has. 10/19/15; the Dr. Doren Custard appointment was largely for an arteriogram which was normal. She does not have an arterial issue. He didn't make a comment about her chronic venous insufficiency for which she has had previous ablations. Presumably it was not felt that anything additional could be done. The patient is now out of work as I prescribed 2 weeks ago. Her wounds look somewhat less aggravated presumably because of this. I felt I would give debridement another try today 10/25/15; no major change in this patient's wounds. We are struggling to get her product that she can afford into her own home through her insurance. 11/01/15; no major change in the patient's wounds. I have been using silver alginate as the most affordable product. I spoke to Dr. Marla Roe last week with her requested take her to the OR for surgical debridement and placement of ACEL. Dr. Marla Roe told me that she would be willing to do this however [facility] will not cover this, fortunately the patient has [place] healthcare of some variant 11/08/15; no major change in the patient's wounds. She has been completely nonviable surface that this but is in too much pain with any attempted debridement are clinic. I have arranged for her to see Dr. Marla Roe ham of plastic surgery and this appointment is on Monday. I am hopeful that they will take her to the OR for debridement, possible ACEL ultimately possible skin graft 11/22/15 no major change in the patient's wounds over her bilateral medial calcaneus medial malleolus into the lower legs. Surface on these does not look too bad however on the left there is surrounding erythema and tenderness. This may be cellulitis or could him sleepy tinea. 11/29/15; no major changes in the patient's wounds over her bilateral medial malleolus. There is no infection here and I don't think any additional antibiotics are necessary. There is now plan to move forward. She sees Dr. Marla Roe in a week's time for preparation for operative debridement and ACEL placement I believe on 7/12. She then has a follow-up appointment with Dr. Marla Roe on 7/21 12/28/15; the patient returns today having been taken to the [facility] by Dr. Marla Roe 12/12/15 she underwent debridement, intraoperative cultures [which were negative]. She had placement of a wound VAC. Parent really ACEL was not available to be placed. The wound VAC foam apparently adhered to the wound since then she's been using silver alginate, Xeroform under Ace wraps. She still says there is a lot of drainage and a lot of pain 01/31/16; this is a patient I see monthly. I had referred her to Dr. Marla Roe him of plastic surgery for large wounds on her bilateral medial ankles. She has been to the OR twice once in early July and once in early August. She tells me over the last 3 weeks she has been using the wound VAC with ACEL underneath it. On the right we've simply been  using silver alginate. Under Kerlix Coban wraps. 02/28/16; this is a patient I'm currently seeing monthly. She is gone on to have a skin graft over her  large venous insufficiency ulcer on the left medial ankle. This was done by Dr. Marla Roe him. The patient is a bit perturbed about why she didn't have one on her right medial ankle wound. She has been using silver alginate to this. 03/06/16; I received a phone call from her plastic surgery Dr. Marla Roe. She expressed some concern about the viability of the skin graft she did on the left medial ankle wound. Asked me to place Endoform on this. She told me she is not planning to do a subsequent skin graft on the right as the left one did not take very well. I had placed Hydrofera Blue on the right 03/13/16; continue to have a reasonably healthy wound on the right medial ankle. Down to 3 mm in terms of size. There is epithelialization here. The area on the left medial ankle is her skin graft site. I suppose the last week this looks somewhat better. She has an open area inferiorly however in the center there appears to be some viable tissue. There is a lot of surface callus and eschar that will eventually need to come off however none of this looked to be infected. Patient states that the is able to keep the dressing on for several days which is an improvement. 03/20/16 no major change in the circumference of either wound however on the left side the patient was at Dr. Eusebio Friendly office and they did a debridement of left wound. 50% of the wound seems to be epithelialized. I been using Endoform on the left Hydrofera Blue in the right 03/27/16; she arrives today with her wound is not looking as healthy as they did last week. The area on the right clearly has an adherent surface to this a very similar surface on the left. Unfortunately for this patient this is all too familiar problem. Clearly the Endoform is not working and will need to change that  today that has some potential to help this surface. She does not tolerate debridement in this clinic very well. She is changing the dressing wants 04/03/16; patient arrives with the wounds looking somewhat better especially on the right. Dr. Migdalia Dk change the dressing to silver alginate when she saw her on Monday and also sold her some compression socks. The usefulness of the latter is really not clear and woman with severely draining wounds. 04/10/16; the patient is doing a bit of an experiment wearing the compression stockings that Dr. Migdalia Dk provided her to her left leg and the out of legs based dressings that we provided to the right. 05/01/16; the patient is continuing to wear compression stockings Dr. Migdalia Dk provided her on the left that are apparently silver impregnated. She has been using Iodoflex to the right leg wound. Still a moderate amount of drainage, when she leaves here the wraps only last for 4 days. She has to change the stocking on the left leg every night 05/15/16; she is now using compression stockings bilaterally provided by Dr. Marla Roe. She is wearing a nonadherent layer over the wounds so really I don't think there is anything specific being done to this now. She has some reduction on the left wound. The right is stable. I think all healing here is being done without a specific dressing 06/09/16; patient arrives here today with not much change in the wound certainly in diameter to large circular wounds over the medial aspect of her ankle bilaterally. Under the light of these services are certainly not viable for healing. There is no evidence of  surrounding infection. She is wearing compression stockings with some sort of silver impregnation as prescribed by Dr. Marla Roe. She has a follow-up with her tomorrow. 06/30/16; no major change in the size or condition of her wounds. These are still probably covered with a nonviable surface. She is using only her purchase stockings. She  did see Dr. Marla Roe who seemed to want to apply Dakin's solution to this I'm not extreme short what value this would be. I would suggest Iodoflex which she still has at home. 07/28/16; I follow Mrs. Brook episodically along with Dr. Marla Roe. 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 She has been applying a topical collagen ointment to both wounds with Adaptic. I don't think Dr. Marla Roe is planning to take her back to the OR. 08/19/16; I follow Mrs. Jeneen Rinks on a monthly basis along with Dr. Marla Roe of plastic surgery. 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 I been following her for a number of years. At one point I was able to get the right medial malleolus wound to heal and had the left medial malleolus down to about half its current size however and I had to send her to plastic surgery for an operative debridement. Since then things have been stable to slightly improve the area on the right is slightly better one in the left about the same although there is much less adherent surface than I'm used to with this patient. She is using some form of liquid collagen gel that Dr. Marla Roe provided a Kerlix cover with the patient's own pressure stockings. She tells me that she has extreme pain in both ankles and along the lateral aspect of both feet. She has been unable to work for some period of time. She is telling me she is retiring at the beginning of April. She sees Dr. Doran Durand of orthopedics next week 09/22/16; patient has not seen Dr. Marla Roe since the last time she is here. I'm not really sure what she is using to the wounds other than bits and pieces of think she had left over including most recently Hydrofera Blue. She is using juxtalite stockings. She is having difficulty with her husband's recent illness "stroke". She is having to transport him to  various doctors appointments. Dr. Marla Roe left her the option of a repeat debridement with ACEL however she has not been able to get the time to follow-up on this. She continues to have a fair amount of drainage out of these wounds with certainly precludes leaving dressings on all week 10/13/16; patient has not seen Dr. Marla Roe since she was last in our clinic. I'm not really sure what she is doing with the wounds, we did try to get her [facility] and I think she is actually using this most of the time. Because of drainage she states she has to change this every second day although this is an improvement from what she used to do. She went to see Dr. Doran Durand who did not think she had a muscular issue with regards to her feet, he referred her to a neurologist and I think the appointment is sometime in June. I changed her back to Iodoflex which she has used in the past but not recently. 11/03/16; the patient has been using Iodoflex although she ran out of this. Still claims that there is a lot of drainage although the wound does not look like this. No  surrounding erythema. She has not been back to see Dr. Marla Roe 11/24/16; the patient has been using Iodoflex again but she ran out of it 2 or 3 days ago. There is no major change in the condition of either one of these wounds in fact they are larger and covered in a thick adherent surface slough/nonviable tissue especially on the left. She does not tolerate mechanical debridement in our clinic. Going back to see Dr. Marla Roe of plastic surgery for an operative debridement would seem reasonable. 12/15/16; the patient has not been back to see Dr. Marla Roe. She is been dealing with a series of illnesses and her husband which of monopolized her time. She is been using Sorbact which we largely supplied. She states the drainage is bad enough that it maximum she can go 2-3 days without changing the dressing 01/12/2017 -- the patient has not been back for  about 4 weeks and has not seen Dr. Marla Roe not does she have any appointment pending. 01/23/17; patient has not seen Dr. Marla Roe even though I suggested this previously. She is using Santyl that was suggested last week by Dr. Con Memos this Cost her $16 through her insurance which is indeed surprising 02/12/17; continuing Santyl and the patient is changing this daily. A lot of drainage. She has not been back to see plastic surgery she is using an Ace wrap. Our intake nurse suggested wrap around stockings which would make a good reasonable alternative 02/26/17; patient is been using Santyl and changing this daily due to drainage. She has not been to see plastic surgery she uses in April Ace wrap to control the edema. She did obtain extremitease stockings but stated that the edema in her leg was to big for these 03/20/17; patient is using Santyl and Anasept. Surfaces looked better today the area on the right is actually measuring a little smaller. She has states she has a lot of pain in her feet and ankles and is asking for a consult to pain control which I'll try to help her with through our case manager. 04/10/17; the patient arrives with better-looking wound surfaces and is slightly smaller wound on the left she is using a combination of Santyl and Anasept. She has an appointment or at least as started in the pain control center associated with  regional 05/14/17; this is a patient who I followed for a prolonged period of time. She has venous insufficiency ulcers on her bilateral medial ankles. At one point I had this down to a much smaller wound on the left however these reopened and we've never been able to get these to heal. She has been using Santyl and Anasept gel although 2 weeks ago she ran out of the Anasept gel. She has a stable appearance of the wound. She is going to the wound care clinic at [facility]. They wanted do a nerve block/spinal block although she tells me she is  reluctant to go forward with that. 05/21/17; this is a patient I have followed for many years. She has venous insufficiency ulcers on her bilateral medial ankles. Chronic pain and deformity in her ankles as well. She is been to see plastic surgery as well as orthopedics. Using PolyMem AG most recently/Kerramax/ABDs and 2 layer compression. She has managed to keep this on and she is coming in for a nurse check to change the dressing on Tuesdays, we see her on Fridays 06/05/17; really quite a good looking surface and the area especially on the right medial has contracted  in terms of dimensions. Well granulated healthy-looking tissue on both sides. Even with an open curet there is nothing that even feels abnormal here. This is as good as I've seen this in quite some time. We have been using PolyMem AG and bringing her in for a nurse check 06/12/17; really quite good surface on both of these wounds. The right medial has contracted a bit left is not. We've been using PolyMem and AG and she is coming in for a nurse visit 06/19/17; we have been using PolyMem AG and bringing her in for a nurse check. Dimensions of her wounds are not better but the surfaces looked better bilaterally. She complained of bleeding last night and the left wound and increasing pain bilaterally. She states her wound pain is more neuropathic than just the wounds. There was some suggestion that this was radicular from her pain management doctor in talking to her it is really difficult to sort this out. 06/26/17; using PolyMem and AG and bringing her in for a nurse check as All of this and reasonably stable condition. Certainly not improved. The dimensions on the lateral part of the right leg look better but not really measuring better. The medial aspect on the left is about the same. 07/03/16; we have been using PolyMen AG and bringing her in for a nurse check to change the dressings as the wounds have drainage which precludes once weekly  changing. We are using all secondary absorptive dressings.our intake nurse is brought up the idea of using a wound VAC/snap VAC on the wound to help with the drainage to see if this would result in some contraction. This is not a bad idea. The area on the right medial is actually looking smaller. Both wounds have a reasonable-looking surface. There is no evidence of cellulitis. The edema is well controlled 07/10/17; the patient was denied for a snap VAC by her insurance. The major issue with these wounds continues to be drainage. We are using wicked PolyMem AG and she is coming in for a nurse visit to change this. The wounds are stable to slightly improved. The surface looks vibrant and the area on the right certainly has shrunk in size but very slowly 07/17/17; the patient still has large wounds on her bilateral medial malleoli. Surface of both of these wounds looks better. The dimensions seem to come and go but no consistent improvement. There is no epithelialization. We do not have options for advanced treatment products due to insurance issues. They did not approve of the wound VAC to help control the drainage. More recently we've been using PolyMem and AG wicked to allow drainage through. We have been bringing her in for a nurse visit to change this. We do not have a lot of options for wound care products and the home again due to insurance issues 07/24/17; the patient's wound actually looks somewhat better today. No drainage measurements are smaller still healthy-looking surface. We used silver collagen under PolyMen started last week. We have been bringing her in for a dressing change 07/31/17; patient's wound surface continued to look better and I think there is visible change in the dimensions of the wound on the right. Rims of epithelialization. We have been using silver collagen under PolyMen and bringing her in for a dressing change. There appears to be less drainage although she is still in need  of the dressing change 08/07/17. Patient's wound surface continues to look better on both sides and the area on the right  is definitely smaller. We have been using silver collagen and PolyMen. She feels that the drainage has been it has been better. I asked her about her vascular status. She went to see Dr. Aleda Grana at [facility] and had some form of ablation. I don't have much detail on this. I haven't my notes from 2016 that she was not a candidate for any further ablation but I don't have any more information on this. We had referred her to vein and vascular I don't think she ever went. He does not have a history of PAD although I don't have any information on this either. We don't even have ABIs in our record 08/14/17; we've been using silver collagen and PolyMen cover. And putting the patient and compression. She we are bringing her in as a nurse visit to change this because ofarge amount of drainage. We didn't the ABIs in clinic today since they had been done in many moons 1.2 bilaterally. She has been to see vein and vascular however this was at [facility] and she had ablation although I really don't have any information on this all seemed biking get a report. She is also been operatively debrided by plastic surgery and had a cell placed probably 8-12 months ago. This didn't have a major effect. We've been making some gains with current dressings 08/19/17-She is here in follow-up evaluation for bilateral medial malleoli ulcers. She continues to tolerate debridement very poorly. We will continue with recently changed topical treatment; if no significant improvement may consider switching to Iodosorb/Iodoflex. 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 She has been using silver collagen and PolyMem. I believe she has been used and tried on Iodoflex before. During her trip to the clinic we've been watching her wound with Anasept spray and I  would like to encourage this on thenurse visit days 09/04/17 bilateral medial malleoli ulcers area is her chronic related to chronic venous insufficiency. These have been very refractory over time. We have been using silver collagen and PolyMen. She is coming in once a week for a doctor's and once a week for nurse visits. We are actually making some progress 09/18/17; the patient's wounds are smaller especially on the right medial. She arrives today to upset to consider even washing these off with Anasept which I think is been part of the reason this is been closing. We've been using collagen covered in PolyMen otherwise. It is noted that she has a small area of folliculitis on the right medial calf that. As we are wrapping her legs I'll give her a short course of doxycycline to make sure this doesn't amount to anything. She is a long list of complaints today including imbalance, shortness of breath on exertion, inversion of her left ankle. With regards to the latter complaints she is been to see orthopedics and they offered her a tendon release surgery I believe but wanted her wounds to be closed first. I have recommended she go see her primary physician with regards to everything else. 09/25/17; patient's wounds are about the same size. We have made some progress bilaterally although not in recent weeks. She will not allow me T wash these wounds with Anasept even if she is doing her cell. Wheeze we've been using collagen covered in PolyMen. Last week she had a small area of folliculitis this is now opened into a small wound. She completed 5 days of trimethoprim sulfamethoxazole 10/02/17; unfortunately the area on her left medial  ankle is worse with a larger wound area towards the Achilles. The patient complains of a lot of pain. She will not allow debridement although visually I don't think there is anything to debridement in any case. We have been using silver collagen and PolyMen for several months now.  Initially we are making some progress although I'm not really seeing that today. We will move back to [facility]. His admittedly this is a bit of a repeat however I'm hoping that his situation is different now. The patient tells me she had her leg on the left give out on her yesterday this is process some pain. 10/09/17; the patient is seen twice a week largely because of drainage issues coming out of the chronic medial bimalleolar wounds that are chronic. Last week the dimensions of the one on the left looks a little larger I changed her to [facility]. She comes in today with a history of terrible pain in the bilateral wound areas. She will not allow debridement. She will not even allow a tissue culture. There is no surrounding erythema no no evidence of cellulitis. We have been putting her Kerlix Coban man. She will not allow more aggressive compression as there was a suggestion to put her in 3 layer wraps. 10/16/17; large wounds on her bilateral medial malleoli. These are chronic. Not much change from last week. The surface looks have healthy but absolutely no epithelialization. A lot of pain little less so of drainage. She will not allow debridement or even washing these off in the vigorous fashion with Anasept. 10/23/17; large wounds on her bilateral malleoli which are chronic. Some improvement in terms of size perhaps on the right since last time I saw these. She states that after we increased the 3 layer compression there was some bleeding, when she came in for a nurse visit she did not want 3 layer compression put back on about our nurse managed to convince her. She has known chronic venous visit issues and I'm hoping to get her to tolerate the 3 layer compression. using Hydrofera Blue 10/30/17; absolutely no change in the condition of either wound although we've had some improvement in dimensions on the right.. Attempted to put her in 3 layer compression she didn't tolerated she is back in  2 layer compression. We've been using Hydrofera Blue We looked over her past records. She had venous reflux studies in November 2016. There was no evidence of deep venous reflux on the right. Superficial vein did not show the greater saphenous vein at think this is been previously ablated the small saphenous vein was within normal limits. The left deep venous system showed no DVT the vessels were positive for deep venous reflux in the posterior tibial veins at the ankle. The greater saphenous vein was surgically absent small saphenous vein was within normal limits. She went to vein and vascular at [place] [facility]. I believe she had an ablation on the left greater saphenous vein. I'll update her reflux studies perhaps ever reviewed by vein and vascular. We've made absolutely no progress in these wounds. Will also try to read and TheraSkins through her insurance 11/06/17; W the patient apparently has a 2 week follow-up with vein and vascular I like him to review the whole issue with regards to her previous vascular workup by Dr. Aleda Grana. We've really made no progress on these wounds in many months. She arrives today with less viable looking surface on the left medial ankle wound. This was apparently looking about  the same on Tuesday when she was here for nurse visit. 11/13/17; deep tissue culture I did last time of the left lower leg showed multiple organisms without any predominating. In particular no Staphylococcus or group A strep were isolated. We sent her for venous reflux studies. She's had a previous left greater saphenous vein stripping and I think sclerotherapy of the right greater saphenous vein. She didn't really look at the lesser saphenous vein this both wounds are on the medial aspect. She has reflux in the common femoral vein and popliteal vein and an accessory vein on the right and the common femoral vein and popliteal vein on the left. I'm going to have her go to see vein and vascular  just the look over things and see if anything else beside aggressive compression is indicated here. We have not been able to make any progress on these wounds in spite of the fact that the surface of the wounds is never look too bad. 11/20/17; no major change in the condition of the wounds. Patient reports a large amount of drainage. She has a lot of complaints of pain although enlisting her today I wonder if some of this at least his neuropathic rather than secondary to her wounds. She has an appointment with vein and vascular on 12/30/17. The refractory nature of these wounds in my mind at least need vein and vascular to look over the wounds the recent reflux studies we did and her history to see if anything further can be done here. I also note her gait is deteriorated quite a bit. 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 She has a bilateral Trendelenburg gait. I wonder if this is neuropathic or perhaps multilevel radicular. 11/27/17; her wounds actually looks slightly better. Healthy-looking granulation tissue a scant amount of epithelialization. [place] [employer] will not pay for [employer]. They will play for tri layer Oasis and Dermagraft. This is not a diabetic ulcer. We'll try for the tri layer Oasis. She still complains of some drainage. She has a vein and vascular appointment on 12/30/17 12/04/17; the wounds visually look quite good. Healthy-looking granulation with some degree of epithelialization. We are still waiting for response to our request for trial to try layer Oasis. Her appointment with vascular to review venous and arterial issues isn't sold the end of July 7/31. Not allow debridement or even vigorous cleansing of the wound surface. 12/18/17; slightly smaller especially on the right. 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 We've been using Hydrofera Blue. We still are looking into triple layer Oasis through her insurance 01/08/18 on evaluation  today patient's wound actually appears to be showing signs of good improvement at this point in time. She has been tolerating the dressing changes without complication. Fortunately there does not appear to be any evidence of infection at this point in time. We have been utilizing silver nitrate which does seem to be of benefit for her which is also good news. Overall I'm very happy with how things seem to be both regards appearance as well as measurement. Patient did see Dr. Bridgett Larsson for evaluation on 12/30/17. In his assessment he felt that stripping would not likely add much more than chronic compression to the patient's healing process. His recommendation was to follow-up in three months with Dr. Doren Custard if she hasn't healed in order to consider referral back to you and see vascular where she previously was in a trial and was able to get her wound to heal.  I'll be see what she feels she when you staying compression and he reiterated this as well. 01/13/18 on evaluation today patient appears to actually be doing very well in regard to her bilateral medial malleolus ulcers. She seems to have tolerated the chemical cauterization with silver nitrate last week she did have some pain through that evening but fortunately states that I'll be see since it seems to be doing better she is overall pleased with the progress. 01/21/18; really quite a remarkable improvement since I've last seen these wounds. We started using silver nitrate specially on the islands of hyper granulation which for some reason her around the wound circumference. This is really done quite nicely. Primary dressing Hydrofera Blue under 4 layer compression. She seems to be able to hold out without a nurse rewrap. Follow-up in 1 week 01/28/18; we've continued the hydrofera blue but continued with chemical cauterization to the wound area that we started about a month ago for irregular hyper granulation. She is made almost stunning improvement in the  overall wound dimensions. I was not really expecting this degree of improvement in these chronic wounds 02/05/18; we continue with Hydrofera Bluebut of also continued the aggressive chemical cauterization with silver nitrate. We made nice progress with the right greater than left wound. 02/12/18. We continued with Hydrofera Blue after aggressive chemical cauterization with silver nitrate. We appear to be making nice progress with both wound areas 02/19/2018; we continue with [facility] after washing the wounds vigorously with Anasept spray and chemical cauterization with silver nitrate. We are making excellent progress. The area on the right's just about closed 02/26/2018. The area on the left medial ankle had too much necrotic debris today. I used a #5 curette we are able to get most of the soft. I continued with the silver nitrate to the much smaller wound on the right medial ankle she had a new area on her right lower pretibial area which she says was due to a role in her compression 03/05/2018; both wound areas look healthy. Not much change in dimensions from last week. I continue to use silver nitrate and Hydrofera Blue. The patient saw Dr. Doren Custard of vein and vascular. He felt she had venous stasis ulcers. He felt based on her previous arteriogram she should have adequate circulation for healing. Also she has deep venous reflux but really no significant correctable superficial venous reflux at this time. He felt we should continue with conservative management including leg elevation and compression 04/02/2018; since we last saw this woman about a month ago she had a fall apparently suffered a pelvic fracture. I did not look up the x-ray. Nevertheless because of pain she literally was bedbound for 2 weeks and had home health coming out to change the dressing. Somewhat predictably this is resulted in considerable improvement in both wound areas. The right is just about closed on the medial malleolus  and the left is about half the size. 04/16/2018; both her wounds continue to go down in size. Using Hydrofera Blue. 05/07/18; both her wounds appeared to be improving especially on the right where it is almost closed. We are using Hydrofera Blue 05/14/2018; slightly worse this week with larger wounds. Surface on the left medial not quite as good. We have been using Hydrofera Blue 05/21/18; again the wounds are slightly larger. Left medial malleolus slightly larger with eschar around the circumference. We have been using Hydrofera Blue undergoing a wraps for a prolonged period of time. This got a lot better  when she was more recumbent due to a fall and a back injury. I change the primary dressing the silver alginate today. She did not tolerate a 4 layer compression previously although I may need to bring this up with her next time 05/28/2018; area on the left medial malleolus again is slightly larger with more drainage. Area on the right is roughly unchanged. She has a small area of folliculitis on the right medial just on the lower calf. This does not look ominous. 06/03/2018 left medial malleolus slightly smaller in a better looking surface. We used silver nitrate on this last time with silver alginate. The area on the right appears slightly smaller 1/10; left medial malleolus slightly smaller. Small open area on the right. We used silver nitrate and silver alginate as of 2 weeks ago. We continue with the wound and compression. These got a lot better when she was off her feet 1/17; right medial malleolus wound is smaller. The left may be slightly smaller. Both surfaces look somewhat better. 1/24; both wounds are slightly smaller. Using silver alginate under Unna boots 1/31; both wounds appear smaller in fact the area on the right medial is just about closed. Surface eschar. We have been using silver alginate under Unna boots. The patient is less active now spends let much less time on her feet and I  think this is contributed to the general improvement in the wound condition 2/7; both wounds appear smaller. I was hopeful the right medial would be closed however there there is still the same small open area. Slight amount of surface eschar on the left the dimensions are smaller there is eschar but the wound edges appear to be free. We have been using silver alginate under Unna boot's 2/14; both wounds once again measure smaller. Circumferential eschar on the left medial. We have been using silver alginate under Unna boots with gradual improvement 2/21; the area on the right medial malleolus has healed. The area on the left is smaller. We have been using silver alginate and Unna boots. We can discharge wrapping the right leg she has 20/30 stockings at home she will need to protect the scar tissue in this area 2/28; the area on the right medial malleolus remains closed the patient has a compression stocking. The area on the left is smaller. We have been using silver alginate and Unna boots. 3/6 the area on the right medial ankle remains closed. Good edema control noted she is using her own compression stocking. The area on the left medial ankle is smaller. We have been managing this with silver alginate and Unna boots which we will continue today. 3/13; the area on the right medial ankle remains closed and I'm declaring it healed today. When necessary the left is about the same still a healthy-looking surface but no major change and wound area. No evidence of infection and using silver alginate under unna and generally making considerable improvement 3/27 the area on the right medial ankle remains closed the area on the left is about the same as last week. Certainly not any worse we have been using silver alginate under an Unna boot 4/3; the area on the right medial ankle remains closed per the patient. We did not look at this wound. The wound on the left medial ankle is about the same surface looks  healthy we have been using silver alginate under an Unna boot 4/10; area on the right medial ankle remains closed per the patient. We did not look  at this wound. The wound on the left medial ankle is slightly larger. The patient complains that the [facility] caused burning pain all week. She also told us that she was a lot more active this week. Changed her back to silver alginate 4/17; right medial ankle still closed per the patient. Left medial ankle is slightly larger. Using silver alginate. She did not tolerate Hydrofera Blue on this area 4/24; right medial ankle remains closed we have not look at this. The left medial ankle continues to get larger today by about a centimeter. We have been using silver alginate under Unna boots. She complains about 4 layer compression as an alternative. She has been up on her feet working on her garden 5/8; right medial ankle remains closed we did not look at this. The left medial ankle has increased in size about 100%. We have been using silver alginate under Unna boots. She noted increased pain this week and was not surprised that the wound is deteriorated 5/15; no major change in SA however much less erythema ( one week of doxy ocellulitis). 5/22-65 year old female returns at 1 week to the clinic for left medial ankle wound for which we have been using silver alginate under 3 layer compression She was placed on DOXY at last visit - the wound is wider at this visit. She is in 3 layer compression 5/29; change to [facility] last week. I had given her empiric doxycycline 2 weeks ago for a week. She is in 3 layer compression. She complains of a lot of pain and drainage on presentation today. 6/5; using Hydrofera Blue. I gave her doxycycline recently empirically for erythema and pain around the wound. Believe her cultures showed enterococcus which not would not have been well covered by doxycycline nevertheless the wound looks better and I don't feel  specifically that the enterococcus needs to be covered. She has a new what looks like a wrap injury on her lateral left ankle. 6/12; she is using Hydrofera Blue. She has a new area on the left anterior lower tibial area. This was a wrap injury last week. 6/19; the patient is using Hydrofera Blue. She arrived with marked inflammation and erythema around the wound and tenderness. 12/01/18 on evaluation today patient appears to be doing a little bit better based on what I'm hearing from the standpoint of lassos evaluation to this as far as the overall appearance of the wound is concerned. Then sometime substandard she typically sees Dr. Dellia Nims. Nonetheless overall very pleased with the progress that she's made up to this point. No fevers, chills, nausea, or vomiting noted at this time. 7/10; some improvement in the surface area. Aggressively debrided last week apparently. I went ahead with the debridement today although the patient does not tolerate this very well. We have been using Iodoflex. Still a fair amount of drainage 7/17; slightly smaller. Using Iodoflex. 7/24; no change from last week in terms of surface area. We have been using Iodoflex. Surface looks and continues to look somewhat better 7/31; surface area slightly smaller better looking surface. We have been using Iodoflex. This is under Unna boot compression 8/7-Patient presents at 1 week with Unna boot and Iodoflex, wound appears better 8/14-Patient presents at 1 week with Iodoflex, we use the Unna boot, wound appears to be stable better.Patient is getting Botox treatment for the inversion of the foot for tendon release, Next week 8/21; we are using Iodoflex. Unna boot. The wound is stable in terms of surface area. Under  illumination there is some areas of the wound that appear to be either epithelialized or perhaps this is adherent slough at this point I was not really clear. It did not wipe off and I was reluctant to debride this  today. 8/28; we are using Iodoflex in an Unna boot. Seems to be making good improvement. 9/4; using Iodoflex and wound is slightly smaller. 9/18; we are using Iodoflex with topical silver nitrate when she is here. The wound continues to be smaller 10/2; patient missed her appointment last week due to GI issues. She left and Iodoflex based dressing on for 2 weeks. Wound is about the same size about the size of a dime on the left medial lower 10/9 we have been using Iodoflex on the medial left ankle wound. She has a new superficial probable wrap injury on the dorsal left ankle 10/16; we have been using Hydrofera Blue since last week. This is on the left medial ankle 10/23; we have been using Hydrofera Blue since 2 weeks ago. This is on the left medial ankle. Dimensions are better 11/6; using Hydrofera Blue. I think the wound is smaller but still not closed. Left medial ankle 11/13; we have been using Hydrofera Blue. Wound is certainly no smaller this week. Also the surface not as good. This is the remanent of a very large area on her left medial ankle. 11/20; using Sorbact since last week. Wound was about the same in terms of size although I was disappointed about the surface debris 12/11; 3-week follow-up. Patient was on vacation. Wound is measuring slightly larger we have been using Sorbact. 12/18; wound is about the same size however surface looks better last week after debridement. We have been using Sorbact under compression 1/15 wound is probably twice the size of last time increased in length nonviable surface. We have been using Sorbact. She was running a mild fever and missed her appointment last week 1/22; the wound is come down in size but under illumination still a very adherent debris we have been Hydrofera Blue that I changed her to last week 1/29; dimensions down slightly. We have been using Hydrofera Blue 2/19 dimensions are the same however there is rims of epithelialization under  illumination. Therefore more the surface area may be epithelialized 2/26; the patient's wound actually measures smaller. The wound looks healthy. We have been using Hydrofera Blue. I had some thoughts about running Apligraf then I still may do that however this looks so much better this week we will delay that for now 3/5; the wound is small but about the same as last week. We have been using Hydrofera Blue. No debridement is required today. 3/19; the wound is about the size of a dime. Healthy looking wound even under illumination. We have been using Hydrofera Blue. No mechanical debridement is necessary 3/26; not much change from last week although still looks very healthy. We have been using Hydrofera Blue under Unna boots Patient was offered an ankle fusion by podiatry but not until the wound heals with a proceed with this. 4/9; the patient comes in today with her original wound on the medial ankle looking satisfactory however she has some uncontrolled swelling in the middle part of her leg with 2 new open areas superiorly just lateral to the tibia. I think this was probably a wrap issue. She said she felt uncomfortable during the week but did not call in. We have been using Hydrofera Blue 4/16; the wound on the medial ankle is about  the same. She has innumerable small areas superior to this across her mid tibia. I think this is probably folliculitis. She is also been working in the yard doing a lot of sweating 4/30; the patient issue on the upper areas across her mid tibia of all healed. I think this was excessive yard work if I remember. Her wound on the medial ankle is smaller. Some debris on this we have been using Hydrofera Blue under Unna boots 5/7; mid tibia. She has been using Hydrofera Blue under an Unna wrap. She is apparently going for her ankle surgery on June 3 10/28/19-Patient returns to clinic with the ankle wound, we are using Hydrofera Blue under Unna wrap, surgery is scheduled for  her left foot for June 3 so she will be back for nurse visit next week READMISSION 01/17/2020 Mrs. Garver is a 64 year old woman we have had in this clinic for a long period of time with severe venous hypertension and refractory wounds on her medial lower legs and ankles bilaterally. 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 When she was off her legs for a prolonged period example when she fell and suffered a compression fracture things would heal up quite nicely. She is now retired and we managed to heal up the right medial leg wound. The left one was very tiny last time I saw this although still refractory. 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 It got to the point where this was interfering with her walking and she elected to proceed with a ankle arthrodesis to straighten her her ankle and leave her with a functional outcome for mobilization. The patient was referred to Dr. Doren Custard and really this took some time to arrange. Dr. Doren Custard saw her on 12/07/2019. Once again he verified that she had no arterial issues. She had previously had an angiogram several years ago. Follow-up ABIs on the left showed an ABI of 1.12 with triphasic waveforms and a TBI of 0.92. She is felt to have chronic deep venous insufficiency but I do not think it was felt that anything could be done from about this from an ablation point of view. At the time Dr. Doren Custard saw this patient the wounds actually look closed via the pictures in his clinic. The patient finally underwent her surgery on 12/15/2019. This went reasonably well and there was a good anatomic outcome. She developed a small distal wound dehiscence on the lateral part of the surgical wound. However more problematically she is developed recurrence of the  wound on the medial left ankle. There are actually 2 wounds here one in the distal lower leg and 1 pretty much at the level of the medial malleolus. It is a more distal area that is more problematic. She has been using Hydrofera Blue which started on Friday before this she was simply Ace wrapping. There was a culture done that showed Pseudomonas and she is on ciprofloxacin. A recent CNS on 8/11 was negative. The patient reports some pain but I generally think this is improving. She is using a cam boot completely nonweightbearing using a walker for pivot transfers and a wheelchair 8/24; not much improvement unfortunately she has a surgical wound on the lateral part in the venous insufficiency wound medially. The bottom part of the medial insufficiency  wound is still necrotic there is exposed tendon here. We have been using Hydrofera Blue under compression. Her edema control is however better 8/31; patient in for follow-up of his surgical wound on the lateral part of her left leg and chronic venous insufficiency ulcers medially. We put her back in compression last week. She comes in today with a complaint of 3 or 4 days [name] of increasing pain. She felt her cam walker was rubbing on the area on the back of her heel. However there is intense erythema seems more likely she has cellulitis. She had 2 cultures done when she was seeing podiatry in the postop. One of them in late July showed Pseudomonas and she received a course of ciprofloxacin the other was negative on 8/11 she is allergic to penicillin with anaphylactoid complaints of hives oral swelling via information in epic 9/9; when I saw this patient last week she had intense anterior erythema around her wound on the right lateral heel and ankle and also into the right medial heel. Some of this was no doubt drainage and her walker boot however I was convinced she had cellulitis. I gave her Levaquin and Bactrim she is finishing up on this now. She is  following up with Dr. [name] he saw her yesterday. He is taken her out of the walking boot of course she is still nonweightbearing. Her x-ray was negative for any worrisome features such as soft tissue air etc. Things are a lot better this week. She has home health. We have been using Hydrofera Blue under an [employer] which she put back on yesterday. I did not wrap her last week 9/17; her surrounding skin looks a lot better. In fact the area on the left lateral ankle has just a scant amount of eschar. The only remaining wound is the large area on the left medial ankle. Probably about 60% of this is healthy granulation at the surface however she has a significant divot distally. This has adherent debris in it. I been using debridement and silver collagen to try and get this area to fill-in although I do not think we have made much progress this week 9/24; the patient's wound on the left medial ankle looks a lot better. The deeper divot area distally still requires debridement but this is cleaning up quite nicely we have been using silver collagen. The patient is complaining of swelling in her foot and is worried that that is contributing to the nonhealing of the ankle wound. She is also complaining of numbness in her anterior toes 10/4; left medial ankle. The small area distally still has a divot with necrotic material that I have been debriding away. This has an undermining area. She is approved for Apligraf. She saw Dr. [name] her surgeon on 10/1. I think he declared himself is satisfied with the condition of things. Still nonweightbearing till the end of the month. 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 Her surgical wound is well closed. There is no evidence of infection 10/11; the patient arrived in clinic today with the expectation that we be able to put an Apligraf on this area after debridement however she arrives with a relatively large amount of green drainage  on the dressing. The patient states that this started on Friday. She has not been systemically unwell. 10/19; culture I did last week showed both Enterococcus and Pseudomonas. I think this came in separate parts because I stopped her ciprofloxacin I gave her and  prescribed her linezolid however now looking at the final culture result this is Pseudomonas which is resistant to quinolones. She has not yet picked up the linezolid apparently phone issues. We are also trying to get a topical antibiotic out of [place] in [place] they can be applied by home health. She is still having green drainage 10/16; the patient has her topical antibiotic from [facility] in [place]. This is a compounded gel with vancomycin and ciprofloxacin and gentamicin. We are applying this on the wound bed with silver alginate over the top with Unna boot wraps. She arrives in clinic today with a lot less ominous looking drainage although she is only use this topical preparation once the second time today. She sees Dr. [name] her surgeon on Friday she has home health changing the dressing 11/2; still using her compounded topical antibiotic under silver alginate. Surface is cleaning up there is less drainage. We had an Apligraf for her today and I elected to apply it. A light coating of her antibiotic 04/25/2020 upon evaluation today patient appears to be doing well with regard to her ankle ulcer. There is a little bit of slough noted on the surface of the wound I am can have to perform sharp debridement to clear this away today. With that being said other than that fact overall I feel like she is making progress and we do see some new epithelial growth. There is also some improvement in the depth of the wound and that distal portion. There is little bit of slough there as well. 12/7; 2-week follow-up. Apligraf #3. Dimensions are smaller. Closing in especially inferiorly. Still some surface debris. Still using the  [facility] topical antibiotic but I told her that I don't think this needs to be renewed 12/21; 2-week follow-up. Apligraf #4 dimensions are smaller. Nice improvement 06/05/2020; 2-week follow-up. The patient's wound on the left medial ankle looks really excellent. Nice granulation. Advancing epithelialization no undermining no evidence of infection. We would have to reapply for another Apligraf but with the condition of this wound I did not feel strongly about it. We used Hydrofera Blue under the same degree of compression. She follows up with Dr. [name] her surgeon a week Friday 06/13/2020 upon evaluation today patient appears to be doing excellent in regard to her wound. She has been tolerating the dressing changes without complication. Fortunately there is no signs of active infection at this time. No fevers, chills, nausea, vomiting, or diarrhea. She was using Hydrofera Blue last week. 06/20/2020 06/20/2020 on evaluation today patient actually appears to be doing excellent in regard to her wound. This is measuring better and looking much better as well. She has been using the collagen that seems to be doing better for her as well even though the [facility] was and is not sticking or feeling as rough on her wound. She did see Dr. [name] on Friday he is very pleased he also stated none of the hardware has shifted. That is great news 1/27; the patient has a small clean wound all that is remaining. I agree that this is too small to really consider an Apligraf. Under illumination the surface is looking quite good. We have been using collagen although the dimensions are not any better this week 2/2; the patient has a small clean wound on the left medial ankle. Although this left of her substantial original areas. Measurements are smaller. We have been using polymen Ag under an Haematologist. 2/10; small area on the left medial  ankle. This looks clean nothing to debride however dimensions are about the same we  have been using polymen I think now for 2 weeks 2/17; not much change in surface area. We have been using polymen Ag without any improvement. 3/17; 1 month follow-up. The patient has been using endoform without any improvement in fact I think this looks worse with more depth and more expansion 3/24; no improvement. Perhaps less debris on the surface. We have been using Sorbact for 1 week 4/4; wound measures larger. She has edema in her leg and her foot which she tells as her wrap came down. We have been using Unna boots. Sorbact of the wound. She has been approved for Apligraf 09/12/2020 upon evaluation today patient appears to be doing well with regard to her wound. We did get the Apligraf reapproved which is great news we have that available for application today. Fortunately there is no signs of infection and overall the patient seems to be doing great. The wound bed is nice and clean. 4/27; patient presents for her second application of Apligraf. She states over the past week she has been on her feet more often due to being outside in her garden. She has noted more swelling to her foot as a result. She denies increased warmth, pain or erythema to the wound site. 10/10/2020 upon evaluation today patient appears to be doing well with regard to her wound which does not appear to be quite as irritated as last week from what I am hearing. With that being said unfortunately she is having issues with some erythema and warmth to touch as well as an increase size. I do believe this likely is infected. 10/17/2020 upon evaluation today patient appears to be doing excellent in regard to her wound this is significantly improved as compared to last week. Fortunately I think that the infection is much better controlled at this point. She did have evidence of both Enterococcus as well as Staphylococcus noted on culture. Enterococcus really would not be helped significantly by the Cipro but the wound is doing so  much better I am under the assumption that the Staphylococcus is probably the main organism that is causing the current infection. Nonetheless I think that she is doing excellent as far as that is concerned and I am very pleased in that regard. I would therefore recommend she continue with the Cipro. 10/31/2020 upon evaluation today patient appears to be doing well with regard to her wound. She has been tolerating the dressing changes without complication. Fortunately there is no signs of active infection and overall I am extremely pleased with where things stand today. No fevers, chills, nausea, vomiting, or diarrhea. With that being said she does have some green drainage coming from the wound and although it looks okay I am a little concerned about the possibility of a continuing infection. Specifically with Pseudomonas. For that reason I will go ahead and send in a prescription for Cipro for her to be continued. 11/14/2020 upon evaluation today patient appears to be doing very well currently in regard to her wound on her leg. She has been tolerating the dressing changes without complication. Fortunately I feel like the infection is finally under good control here. Unfortunately we do not have the Apligraf for application today although we can definitely order to have it in place for next week. That will be her fifth and final of the current series. Nonetheless I feel like her wound is really doing quite well which is  great news. 11/21/2020 upon evaluation today patient appears to be doing well with regard to her wound on the medial ankle. Fortunately I think the infection is under control and I do believe we can go ahead and reapply the Apligraf today. She is in agreement with that plan. There does not appear to be any signs of active infection at this time which is great news. No fevers, chills, nausea, vomiting, or diarrhea. 12/05/2020 upon evaluation today patient's wound bed actually showed signs of  good granulation epithelization at this point. There does not appear to be any signs of infection which is great news and overall very pleased with where things stand. Overall the patient seems to be doing fairly well in my opinion with regard to her wound although I do believe she continues to build up a lot of biofilm I think she could benefit from using PuraPly at this point. 12/12/2020 upon evaluation today patient's wound actually appears to be doing decently well today. The Unna boot has not been quite as well-tolerated so that more uncomfortable for her and even causing some pressure over the plate on the lateral portion of her foot which is 90 where the wound is. There did not appear to be any significant deep tissue injury with that there may be a minimal change in the skin noted I think that we may want to go back to the Coflex 2 layer which is a little bit easier on her skin it seems. 12/19/2020 upon evaluation today patient actually seems to be making great progress with the PuraPly currently. She in fact seems to be much better as far as the overall appearance of the wound bed is concerned I am very happy in this regard. I do not see any signs of of infection which is great news as well. No fevers, chills, nausea, vomiting, or diarrhea. 12/26/2020 upon evaluation today patient appears to actually be doing better in regard to her wound on the left medial ankle region. The surface of the wound is actually doing significantly better which is great news. There does not appear to be any signs of infection which is also great news and in general I am extremely pleased with where we stand today. 01/02/2021 upon evaluation today patient appears to be doing well with regard to her wound. In fact this is showing signs of excellent improvement and very pleased with where things stand. In fact the last 3 appointments have all shown signs of this getting smaller which is excellent news. I have not even had  to perform any debridement and today is no exception. Overall I feel like this is dramatically improved compared to previous. T oday is PuraPly application #4. 0/63/0160 upon evaluation today patient appears to be doing excellent in regard to her wound this is continue to show signs of improvement and overall I am extremely pleased with where we stand today. She is actually here for PuraPly application #5. Every time we have applied this she is noted definite improvement on measurements. 01/23/2021 upon evaluation today patient is actually making good progress in regard to her wound. This was actually on just a little bit longer this time compared to previous due to the fact that she did have to go out of town. She is actually here for PuraPly application #6. We have definitely been seeing improvements in the overall quality of the tissue on the surface of the wound which is awesome news. In general I think that the patient seems to  be continuing to make great progress here. 01/30/2021 upon evaluation today patient's wound is actually doing excellent. There is really not any significant biofilm buildup which is great news and overall I am extremely pleased with where things stand today. There does not appear to be any signs of active infection. No fevers, chills, nausea, vomiting, or diarrhea. 02/06/2021 upon evaluation today patient's wound is actually showing signs of excellent improvement which is great news. We continue to see the benefit of the PuraPly this is doing a great job the wound seems not really irritated whatsoever and is showing signs of good granulation at this point. Overall I am extremely happy with what we are seeing. The patient likewise is happy to hear all of this as well. 02/13/2021 upon evaluation today patient appears to be doing well with regard to her wound. This again is measuring smaller today and I am very pleased in this regard. Fortunately there does not appear to be any  signs of active infection at this time which is good news from a systemic standpoint. Locally there is still some significant drainage which she does have is concerned about infection locally. No fevers, chills, nausea, vomiting, or diarrhea. 02/20/2021 upon evaluation today patient actually appears to be doing decently well in regard to her wound. She has been tolerating the dressing changes without complication. I do believe the PuraPly is helping wound bed does appear to be doing better. There is no evidence of active infection locally or systemically at this point visually although on fluorescence imaging there still appear to be bacterial load and bioburden noted. 02/27/2021 upon evaluation today patient fortunately continues to show signs of improvement with use of the PuraPly currently. Subsequently we did review her culture results and to be honest I think that the Bactrim is probably the best option to have her continue at this point. For that reason I am get a go ahead and send in a refill today for her for an additional 2 weeks. Nonetheless I think that we are making excellent progress here. It was Enterococcus and Staphylococcus that were noted she is allergic to penicillin so there is not much I can do from the Enterococcus standpoint though the staff does seem to be sensitive to the Bactrim. 03/06/2021 upon evaluation today patient appears to be doing well with regard to her wound. This is definitely showing signs of improvement which is great news and overall I am extremely pleased with where things stand today. There does not appear to be any signs of active infection which is great news and overall and I do believe that we are headed in the appropriate direction I think the PuraPly is doing an awesome job for her. 03/20/2021 upon evaluation today patient actually appears to be doing well with regard to her wound. We have been using the PuraPly although last week when I was out of the  office they actually just used endoform nonetheless this still seems to be doing great. Fortunately there does not appear to be any signs of active infection which is great news overall I think the patient is making wonderful progress. 03/27/2021 upon evaluation today patient appears to be doing well with regard to her wound in fact there is a little drainage but otherwise her does not appear to be any major signs of open wounds nor infection at this time. Overall I am extremely pleased with where things stand. No fevers, chills, nausea, vomiting, or diarrhea. 04/03/2021 upon evaluation today patient presents for  reevaluation here in the clinic and overall she seems to be doing quite well. Fortunately there does not appear to be any signs of active infection which is great news and in general I am very pleased with how things appear. There is still a very small opening remaining but in general she is looking much better. Fortunately I do not think there is any need for the PuraPly at this point. 04/10/2021 upon evaluation today patient's wound is actually showing signs of excellent improvement. I am actually very pleased with where it stands today. I think she is very close to complete closure in fact this may be healed today. Either way I think that she may benefit from using her own compression socks for the next week and then will subsequently see how things stand following. 04/17/2021 upon evaluation today patient appears to be doing well with regard to her wound on the ankle region. This is actually showing signs of excellent improvement which is great news. I do not see any signs of active infection at this time which is great and no open wound. With that being said on the lateral portion of her ankle right at the base of her plate she actually has an area of pressure that occurred over the past week when she was not in the wrap or we been padding her. This does seem to be an issue which I think is  good to be an ongoing problem I am can actually get in touch with Dr. [name] today to see what we can do in that regard. 04/24/2021 upon evaluation today patient appears to be doing well with regard to her ankle region for the most part although there does appear to be some fluid trapped underneath the area where we thought this was healed last week. Unfortunately I think at this time that we can have to reopen and clean away some of this area and then subsequently depending on how things do over the next week or so we will see about discharge but right now I do not think that is the right way to go. She voiced understanding. 05/01/2021 upon evaluation today patient's wound still appears to be open unfortunately. I do believe that we do need to see what we can do about trying to get this closed I believe compression wrapping is probably can to be indicated here. Fortunately there is no signs of active infection at this time. 05/08/2021 upon evaluation today patient appears to be doing okay in regard to her ulcer this is very superficial and we will see any major issues here. The biggest problem currently that I find is that we just cannot get this area to completely seal up. She seemed to almost be and then things digressed a little bit. Right now is not too bad but we do need to see if we can make some difference here. I think maybe switching over to collagen may be better. 05/15/2021 upon evaluation today patient actually appears to be doing pretty well in regard to her wound. This is measuring a little smaller and looking much better. She did see orthopedics today there to me making her a insert for her [name] to try to help straighten out her gait. I think this can be beneficial for her as well. 05/22/2021 upon evaluation today patient appears to be doing well currently in regard to her wound although is not significantly smaller unfortunately this at least does not show any signs of active infection  at this  time which is great news. No fevers, chills, nausea, vomiting, or diarrhea. I am going to see about switching over to endoform that is what we wanted to use in the past always been on backorder. Objective Constitutional Well-nourished and well-hydrated in no acute distress. Vitals Time Taken: 10:25 AM, Height: 68 in, Temperature: 97.6 F, Pulse: 58 bpm, Respiratory Rate: 20 breaths/min, Blood Pressure: 130/67 mmHg. Respiratory normal breathing without difficulty. Psychiatric this patient is able to make decisions and demonstrates good insight into disease process. Alert and Oriented x 3. pleasant and cooperative. General Notes: Upon inspection patient's wound bed actually showed signs of good granulation and epithelization at this point. Fortunately I do not see any signs of infection which is great news and overall I am very pleased. I am going to perform some debridement to clear away the necrotic debris he tolerated that today without complication postdebridement wound bed appears to be doing much better which is great news. Integumentary (Hair, Skin) Wound #15R status is Open. Original cause of wound was Gradually Appeared. The date acquired was: 12/30/2019. The wound has been in treatment 70 weeks. The wound is located on the Left,Medial Malleolus. The wound measures 1.6cm length x 1cm width x 0.1cm depth; 1.257cm^2 area and 0.126cm^3 volume. There is Fat Layer (Subcutaneous Tissue) exposed. There is no tunneling or undermining noted. There is a medium amount of serous drainage noted. The wound margin is distinct with the outline attached to the wound base. There is medium (34-66%) red, pink granulation within the wound bed. There is a medium (34-66%) amount of necrotic tissue within the wound bed including Adherent Slough. Assessment Active Problems ICD-10 Chronic venous hypertension (idiopathic) with ulcer and inflammation of left lower extremity Non-pressure chronic ulcer of  other part of left lower leg with other specified severity Non-pressure chronic ulcer of left ankle with other specified severity Procedures Wound #15R Pre-procedure diagnosis of Wound #15R is a Venous Leg Ulcer located on the Left,Medial Malleolus .Severity of Tissue Pre Debridement is: Fat layer exposed. There was a Excisional Skin/Subcutaneous Tissue Debridement with a total area of 1.6 sq cm performed by [name], PA. With the following instrument(s): Curette to remove Viable and Non-Viable tissue/material. Material removed includes Subcutaneous Tissue, Slough, Skin: Dermis, Skin: Epidermis, and Fibrin/Exudate after achieving pain control using Lidocaine 5% topical ointment. A time out was conducted at 11:10, prior to the start of the procedure. A Minimum amount of bleeding was controlled with Pressure. The procedure was tolerated well with a pain level of 0 throughout and a pain level of 0 following the procedure. Post Debridement Measurements: 1.6cm length x 1cm width x 0.1cm depth; 0.126cm^3 volume. Character of Wound/Ulcer Post Debridement is improved. Severity of Tissue Post Debridement is: Fat layer exposed. Post procedure Diagnosis Wound #15R: Same as Pre-Procedure Pre-procedure diagnosis of Wound #15R is a Venous Leg Ulcer located on the Left,Medial Malleolus . There was a Double Layer Compression Therapy Procedure by [name] Pilling, RN. Post procedure Diagnosis Wound #15R: Same as Pre-Procedure Plan Follow-up Appointments: Return Appointment in 2 weeks. Margarita Grizzle Wednesday 06/05/2021 Nurse Visit: - Wednesday 05/29/2021 Edema Control - Lymphedema / SCD / Other: Patient to wear own compression stockings every day. Exercise regularly Moisturize legs daily. Non Wound Condition: Apply the following to affected area as directed: - Apply silicone bordered foam to left lateral foot for protection WOUND #15R: - Malleolus Wound Laterality: Left, Medial Cleanser: Wound Cleanser  (Generic) 1 x Per Day/15 Days Discharge Instructions: Cleanse the wound  with wound cleanser prior to applying a clean dressing using gauze sponges, not tissue or cotton balls. Prim Dressing: Endoform 2x2 in 1 x Per Day/15 Days ary Discharge Instructions: Moisten with saline Prim Dressing: adaptic 1 x Per Day/15 Days ary Discharge Instructions: apply adaptic over the endoform Secondary Dressing: Woven Gauze Sponge, Non-Sterile 4x4 in (Generic) 1 x Per Day/15 Days Discharge Instructions: Apply over primary dressing as directed. Com pression Wrap: CoFlex TLC XL 2-layer Compression System 4x7 (in/yd) 1 x Per Day/15 Days Discharge Instructions: Apply CoFlex 2-layer compression as directed. (alt for 4 layer) 1. I Am going to suggest we go ahead and continue with the wound care measures as before and the patient is in agreement with plan this includes the use of the compression wrap which I think is doing a good job. 2. I am also can recommend that we switch over to endoform which I think is can be a better option for her currently. 3. I am also going to suggest that we have the patient continue to monitor for any signs of worsening or infection. Obviously if anything changes she should let me know soon as possible. We will see patient back for reevaluation in 1 week here in the clinic. If anything worsens or changes patient will contact our office for additional recommendations. Electronic Signature(s) Signed: 05/22/2021 11:31:51 AM By: [name] PA-C Entered By: Worthy [name] on 05/22/2021 11:31:50 -------------------------------------------------------------------------------- SuperBill Details Patient Name: Date of Service: [facility] [name], [name] [name]. 05/22/2021 Medical Record Number: [id number] Patient Account Number: [id number] Date of Birth/Sex: Treating RN: 10-08-1955 (65 y.o. 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

## 2021-05-29 ENCOUNTER — Encounter (HOSPITAL_BASED_OUTPATIENT_CLINIC_OR_DEPARTMENT_OTHER): Payer: Medicare Other | Admitting: Internal Medicine

## 2021-05-29 ENCOUNTER — Other Ambulatory Visit: Payer: Self-pay

## 2021-05-29 DIAGNOSIS — L97828 Non-pressure chronic ulcer of other part of left lower leg with other specified severity: Secondary | ICD-10-CM | POA: Diagnosis not present

## 2021-05-29 NOTE — Progress Notes (Signed)
[name], WHITEHEAD ([id number]) Visit Report for 05/29/2021 Arrival Information Details Patient Name: Date of Service: [name], [name] 05/29/2021 10:30 A M Medical Record Number: [id number] Patient Account Number: [id number] Date of Birth/Sex: Treating RN: 05-Aug-1955 (65 y.o. [name] Primary Care Caellum Mancil: [name] Other Clinician: Referring Braxtin Bamba: Treating [name]/Extender: [name] in Treatment: 86 Visit Information History Since Last Visit Added or deleted any medications: No Patient Arrived: [name] Any new allergies or adverse reactions: No Arrival Time: 10:48 Had a fall or experienced change in No Accompanied By: self activities of daily living that may affect Transfer Assistance: None risk of falls: Patient Identification Verified: Yes Signs or symptoms of abuse/neglect since last visito No Secondary Verification Process Completed: Yes Hospitalized since last visit: No Patient Requires Transmission-Based Precautions: No Implantable device outside of the clinic excluding No Patient Has Alerts: Yes cellular tissue based products placed in the center Patient Alerts: L ABI =1.12, TBI = .92 since last visit: R ABI= 1.02, TBI= .58 Has Dressing in Place as Prescribed: Yes Has Compression in Place as Prescribed: Yes Pain Present Now: Yes Electronic Signature(s) Signed: 05/29/2021 4:57:25 PM By: [name] Gouty RN, BSN Entered By: [name] on 05/29/2021 10:51:33 -------------------------------------------------------------------------------- Compression Therapy Details Patient Name: Date of Service: [name], [name] 05/29/2021 10:30 A M Medical Record Number: [id number] Patient Account Number: [id number] Date of Birth/Sex: Treating RN: [birth year] (65 y.o. [name] Primary Care Jarrod Mcenery: [name] Other Clinician: Referring Asiya Cutbirth: Treating [name]/Extender: [name] in  Treatment: 47 Compression Therapy Performed for Wound Assessment: Wound #15R Left,Medial Malleolus Performed By: Clinician [name] Gouty, RN Compression Type: Double Layer Electronic Signature(s) Signed: 05/29/2021 4:57:25 PM By: [name] Gouty RN, BSN Entered By: [name] on 05/29/2021 11:08:18 -------------------------------------------------------------------------------- Encounter Discharge Information Details Patient Name: Date of Service: [name], [name] 05/29/2021 10:30 A M Medical Record Number: [id number] Patient Account Number: [id number] Date of Birth/Sex: Treating RN: [birth year] (65 y.o. [name] Primary Care [name]: [name] Other Clinician: Referring [name]: Treating [name]/Extender: [name] in Treatment: 29 Encounter Discharge Information Items Discharge Condition: Stable Ambulatory Status: Cane Discharge Destination: Home Transportation: Private Auto Accompanied By: self Schedule Follow-up Appointment: Yes Clinical Summary of Care: Patient Declined Electronic Signature(s) Signed: 05/29/2021 4:57:25 PM By: [name] Gouty RN, BSN Entered By: [name] on 05/29/2021 11:10:01 -------------------------------------------------------------------------------- Patient/Caregiver Education Details Patient Name: Date of Service: [name], [name] 12/28/2022andnbsp10:30 A M Medical Record Number: [id number] Patient Account Number: [id number] Date of Birth/Gender: Treating RN: May 27, 1956 (65 y.o. [name] Primary Care Physician: [name] Other Clinician: Referring Physician: Treating Physician/Extender: [name] in Treatment: 71 Education Assessment Education Provided To: Patient Education Topics Provided Venous: Methods: Explain/Verbal Responses: Reinforcements needed, State content correctly Electronic Signature(s) Signed: 05/29/2021 4:57:25 PM  By: [name] Gouty RN, BSN Entered By: [name] on 05/29/2021 11:09:38 -------------------------------------------------------------------------------- Wound Assessment Details Patient Name: Date of Service: [name], [name] 05/29/2021 10:30 A M Medical Record Number: [id number] Patient Account Number: [id number] Date of Birth/Sex: Treating RN: Jul 12, 1955 (65 y.o. [name] Primary Care Judia Arnott: [name] Other Clinician: Referring [name]: Treating [name]/Extender: [name] in Treatment: 52 Wound Status Wound Number: 15R Primary Venous Leg Ulcer Etiology: Wound Location: Left, Medial Malleolus Wound Status: Open Wounding Event: Gradually Appeared Comorbid Peripheral Venous Disease, Osteoarthritis, Confinement Date Acquired: 12/30/2019 History: Anxiety Weeks Of Treatment:  71 Clustered Wound: No Wound Measurements Length: (cm) 1.6 Width: (cm) 1 Depth: (cm) 0.1 Area: (cm) 1.257 Volume: (cm) 0.126 % Reduction in Area: 89.2% % Reduction in Volume: 98.6% Epithelialization: None Tunneling: No Undermining: No Wound Description Classification: Full Thickness With Exposed Support Structures Wound Margin: Distinct, outline attached Exudate Amount: Medium Exudate Type: Serous Exudate Color: amber Foul [name] After Cleansing: No Slough/Fibrino Yes Wound Bed Granulation Amount: Medium (34-66%) Exposed Structure Granulation Quality: Red, Pink Fascia Exposed: No Necrotic Amount: Medium (34-66%) Fat Layer (Subcutaneous Tissue) Exposed: Yes Necrotic Quality: Adherent Slough Tendon Exposed: No Muscle Exposed: No Joint Exposed: No Bone Exposed: No Treatment Notes Wound #15R (Malleolus) Wound Laterality: Left, Medial Cleanser Wound Cleanser Discharge Instruction: Cleanse the wound with wound cleanser prior to applying a clean dressing using gauze sponges, not tissue or cotton balls. Peri-Wound Care Topical Primary  Dressing Endoform 2x2 in Discharge Instruction: Moisten with saline adaptic Discharge Instruction: apply adaptic over the endoform Secondary Dressing Woven Gauze Sponge, Non-Sterile 4x4 in Discharge Instruction: Apply over primary dressing as directed. Secured With Compression Wrap CoFlex TLC XL 2-layer Compression System 4x7 (in/yd) Discharge Instruction: Apply CoFlex 2-layer compression as directed. (alt for 4 layer) Compression Stockings Add-Ons Electronic Signature(s) Signed: 05/29/2021 4:57:25 PM By: [name] Gouty RN, BSN Entered By: [name] on 05/29/2021 11:07:36 -------------------------------------------------------------------------------- Vitals Details Patient Name: Date of Service: [name], [name] 05/29/2021 10:30 A M Medical Record Number: [id number] Patient Account Number: [id number] Date of Birth/Sex: Treating RN: [birth year] (65 y.o. [name] Primary Care Aiyah Scarpelli: [name] Other Clinician: Referring Florena Kozma: Treating Adhira Jamil/Extender: [name] in Treatment: 66 Vital Signs Time Taken: 10:51 Temperature (F): 98.1 Height (in): 68 Pulse (bpm): 59 Respiratory Rate (breaths/min): 18 Blood Pressure (mmHg): 122/78 Reference Range: 80 - 120 mg / dl Electronic Signature(s) Signed: 05/29/2021 4:57:25 PM By: [name] Gouty RN, BSN Entered By: [name] on 05/29/2021 10:53:37

## 2021-05-29 NOTE — Progress Notes (Signed)
[name], [name] ([id number]) Visit Report for 05/29/2021 SuperBill Details Patient Name: Date of Service: [facility], [name] 05/29/2021 Medical Record Number: [id number] Patient Account Number: [id number] Date of Birth/Sex: Treating RN: [birth year] (65 y.o. 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

## 2021-05-31 ENCOUNTER — Other Ambulatory Visit: Payer: Self-pay

## 2021-05-31 ENCOUNTER — Ambulatory Visit: Payer: Medicare Other

## 2021-05-31 DIAGNOSIS — M21542 Acquired clubfoot, left foot: Secondary | ICD-10-CM

## 2021-05-31 DIAGNOSIS — Z9889 Other specified postprocedural states: Secondary | ICD-10-CM

## 2021-05-31 DIAGNOSIS — M216X2 Other acquired deformities of left foot: Secondary | ICD-10-CM

## 2021-05-31 DIAGNOSIS — L97322 Non-pressure chronic ulcer of left ankle with fat layer exposed: Secondary | ICD-10-CM

## 2021-05-31 NOTE — Progress Notes (Signed)
SITUATION Reason for Consult: Evaluation for Bilateral Custom Foot Orthoses Patient / Caregiver Report: Patient would like to make sure she has finances before proceeding.  Discussed with patient and Dr. [name] about goals with left foot and whether or not she wanted to proceed with AFO or foot orthotics. Patient has chosen foot orthotics but would like to wait to order until she gets her finances in order in the first of the year.

## 2021-06-05 ENCOUNTER — Other Ambulatory Visit: Payer: Self-pay

## 2021-06-05 ENCOUNTER — Encounter (HOSPITAL_BASED_OUTPATIENT_CLINIC_OR_DEPARTMENT_OTHER): Payer: Medicare Other | Attending: Physician Assistant | Admitting: Physician Assistant

## 2021-06-05 DIAGNOSIS — G629 Polyneuropathy, unspecified: Secondary | ICD-10-CM | POA: Diagnosis not present

## 2021-06-05 DIAGNOSIS — S81801A Unspecified open wound, right lower leg, initial encounter: Secondary | ICD-10-CM | POA: Diagnosis not present

## 2021-06-05 DIAGNOSIS — L97828 Non-pressure chronic ulcer of other part of left lower leg with other specified severity: Secondary | ICD-10-CM | POA: Insufficient documentation

## 2021-06-05 DIAGNOSIS — L97328 Non-pressure chronic ulcer of left ankle with other specified severity: Secondary | ICD-10-CM | POA: Insufficient documentation

## 2021-06-05 DIAGNOSIS — I872 Venous insufficiency (chronic) (peripheral): Secondary | ICD-10-CM | POA: Insufficient documentation

## 2021-06-05 NOTE — Progress Notes (Signed)
KENNADEE, WALTHOUR ([id number]) Visit Report for 06/05/2021 Arrival Information Details Patient Name: Date of Service: [facility], [name] 06/05/2021 10:00 A M Medical Record Number: [id number] Patient Account Number: [id number] Date of Birth/Sex: Treating RN: September 11, 1955 (66 y.o. [name] Primary Care Joelle Roswell: [name] Other Clinician: Referring Jaise Moser: Treating Nerine Pulse/Extender: [name] in Treatment: 10 Visit Information History Since Last Visit Added or deleted any medications: No Patient Arrived: Cane Any new allergies or adverse reactions: No Arrival Time: 10:22 Had a fall or experienced change in No Transfer Assistance: None activities of daily living that may affect Patient Identification Verified: Yes risk of falls: Secondary Verification Process Completed: Yes Signs or symptoms of abuse/neglect since last visito No Patient Requires Transmission-Based Precautions: No Hospitalized since last visit: No Patient Has Alerts: Yes Implantable device outside of the clinic excluding No Patient Alerts: L ABI =1.12, TBI = .92 cellular tissue based products placed in the center R ABI= 1.02, TBI= .58 since last visit: Has Dressing in Place as Prescribed: Yes Has Compression in Place as Prescribed: Yes Pain Present Now: No Electronic Signature(s) Signed: 06/05/2021 5:15:33 PM By: [name] Entered By: [name] on 06/05/2021 10:26:06 -------------------------------------------------------------------------------- Compression Therapy Details Patient Name: Date of Service: [name], [name] 06/05/2021 10:00 A M Medical Record Number: [id number] Patient Account Number: [id number] Date of Birth/Sex: Treating RN: 10-Dec-1955 (66 y.o. [name] Primary Care Jayant Kriz: [name] Other Clinician: Referring Liana Camerer: Treating Radford Pease/Extender: [name] in Treatment: 72 Compression Therapy  Performed for Wound Assessment: Wound #15R Left,Medial Malleolus Performed By: Clinician [name], RN Compression Type: Double Layer Post Procedure Diagnosis Same as Pre-procedure Electronic Signature(s) Signed: 06/05/2021 10:49:01 AM By: [name] Entered By: [name] on 06/05/2021 10:49:00 -------------------------------------------------------------------------------- Encounter Discharge Information Details Patient Name: Date of Service: [name], [name] 06/05/2021 10:00 A M Medical Record Number: [id number] Patient Account Number: [id number] Date of Birth/Sex: Treating RN: 1955-08-27 (66 y.o. [name] Primary Care Koralee Wedeking: [name] Other Clinician: Referring Raghad Lorenz: Treating Treasure Ingrum/Extender: [name] in Treatment: 57 Encounter Discharge Information Items Post Procedure Vitals Discharge Condition: Stable Temperature (F): 98.5 Ambulatory Status: Cane Pulse (bpm): 57 Discharge Destination: Home Respiratory Rate (breaths/min): 18 Transportation: Private Auto Blood Pressure (mmHg): 123/70 Schedule Follow-up Appointment: Yes Clinical Summary of Care: Provided on 06/05/2021 Form Type Recipient Paper Patient Patient Electronic Signature(s) Signed: 06/05/2021 11:33:27 AM By: [name] Entered By: [name] on 06/05/2021 11:33:27 -------------------------------------------------------------------------------- Lower Extremity Assessment Details Patient Name: Date of Service: [name], [name] 06/05/2021 10:00 A M Medical Record Number: [id number] Patient Account Number: [id number] Date of Birth/Sex: Treating RN: 07-24-1955 (66 y.o. [name] Primary Care Corynne Scibilia: [name] Other Clinician: Referring Kieron Kantner: Treating Julyan Gales/Extender: [name] Weeks in Treatment: 72 Edema Assessment Assessed: [Left: Yes] [Right: No] Edema: [Left: N] [Right: o] Calf Left:  Right: Point of Measurement: 31 cm From Medial Instep 26 cm Ankle Left: Right: Point of Measurement: 11 cm From Medial Instep 20 cm Vascular Assessment Pulses: Dorsalis Pedis Palpable: [Left:Yes] Electronic Signature(s) Signed: 06/05/2021 5:15:33 PM By: [name] Entered By: [name] on 06/05/2021 10:30:18 -------------------------------------------------------------------------------- Multi-Disciplinary Care Plan Details Patient Name: Date of Service: [name], [name] 06/05/2021 10:00 A M Medical Record Number: [id number] Patient Account Number: [id number] Date of Birth/Sex: Treating RN: 07-11-1955 (66 y.o. [name] Primary Care Ulonda Klosowski: [name]  Other Clinician: Referring Tayshon Winker: Treating Marlaysia Lenig/Extender: [name] in Treatment: 29 Multidisciplinary Care Plan reviewed with physician Active Inactive Venous Leg Ulcer Nursing Diagnoses: Actual venous Insuffiency (use after diagnosis is confirmed) Knowledge deficit related to disease process and management Goals: Patient will maintain optimal edema control Date Initiated: 06/20/2020 Target Resolution Date: 07/10/2021 Goal Status: Active Interventions: Assess peripheral edema status every visit. Compression as ordered Treatment Activities: Therapeutic compression applied : 06/20/2020 Notes: 01/30/21: Edema control ongoing, using CoFlex wrap. 06/05/21: Edema control continues Wound/Skin Impairment Nursing Diagnoses: Impaired tissue integrity Knowledge deficit related to ulceration/compromised skin integrity Goals: Patient/caregiver will verbalize understanding of skin care regimen Date Initiated: 01/17/2020 Target Resolution Date: 07/10/2021 Goal Status: Active Ulcer/skin breakdown will have a volume reduction of 30% by week 4 Date Initiated: 01/17/2020 Date Inactivated: 03/12/2020 Target Resolution Date: 03/09/2020 Goal Status: Met Interventions: Assess  patient/caregiver ability to obtain necessary supplies Assess patient/caregiver ability to perform ulcer/skin care regimen upon admission and as needed Assess ulceration(s) every visit Provide education on ulcer and skin care Notes: 01/30/21: Wound care regimen continues, PuraPly skin sub being used. 04/17/21: Wound healed 06/05/21: Wound re-opened, wound care regimen continues. Electronic Signature(s) Signed: 06/05/2021 5:15:33 PM By: [name] Entered By: [name] on 06/05/2021 10:21:58 -------------------------------------------------------------------------------- Pain Assessment Details Patient Name: Date of Service: [name], [name] 06/05/2021 10:00 A M Medical Record Number: [id number] Patient Account Number: [id number] Date of Birth/Sex: Treating RN: 1955-07-26 (66 y.o. [name] Primary Care Mackenzye Mackel: [name] Other Clinician: Referring Adaijah Endres: Treating Jhonatan Lomeli/Extender: [name] in Treatment: 60 Active Problems Location of Pain Severity and Description of Pain Patient Has Paino No Site Locations Pain Management and Medication Current Pain Management: Electronic Signature(s) Signed: 06/05/2021 5:15:33 PM By: [name] Entered By: [name] on 06/05/2021 10:27:22 -------------------------------------------------------------------------------- Patient/Caregiver Education Details Patient Name: Date of Service: [name], [name] 1/4/2023andnbsp10:00 A M Medical Record Number: [id number] Patient Account Number: [id number] Date of Birth/Gender: Treating RN: 02/12/1956 (66 y.o. [name] Primary Care Physician: [name] Other Clinician: Referring Physician: Treating Physician/Extender: [name] in Treatment: 62 Education Assessment Education Provided To: Patient Education Topics Provided Venous: Methods: Explain/Verbal, Printed Responses: State content  correctly Wound/Skin Impairment: Methods: Explain/Verbal, Printed Responses: State content correctly Electronic Signature(s) Signed: 06/05/2021 5:15:33 PM By: [name] Entered By: [name] on 06/05/2021 10:22:32 -------------------------------------------------------------------------------- Wound Assessment Details Patient Name: Date of Service: [name], [name] 06/05/2021 10:00 A M Medical Record Number: [id number] Patient Account Number: [id number] Date of Birth/Sex: Treating RN: 1956-05-25 (66 y.o. [name] Primary Care Airabella Barley: [name] Other Clinician: Referring Cade Olberding: Treating Deneane Stifter/Extender: [name] in Treatment: 72 Wound Status Wound Number: 15R Primary Venous Leg Ulcer Etiology: Wound Location: Left, Medial Malleolus Wound Status: Open Wounding Event: Gradually Appeared Comorbid Peripheral Venous Disease, Osteoarthritis, Confinement Date Acquired: 12/30/2019 History: Anxiety Weeks Of Treatment: 72 Clustered Wound: No Photos Wound Measurements Length: (cm) 1.8 Width: (cm) 1.2 Depth: (cm) 0.1 Area: (cm) 1.696 Volume: (cm) 0.17 % Reduction in Area: 85.5% % Reduction in Volume: 98.2% Epithelialization: Medium (34-66%) Tunneling: No Undermining: No Wound Description Classification: Full Thickness With Exposed Support Structures Wound Margin: Distinct, outline attached Exudate Amount: Medium Exudate Type: Serous Exudate Color: amber Foul Odor After Cleansing: No Slough/Fibrino Yes Wound Bed Granulation Amount: Large (67-100%) Exposed Structure Granulation Quality: Red, Pink Fascia Exposed: No Necrotic Amount: Small (1-33%) Fat Layer (Subcutaneous  Tissue) Exposed: Yes Necrotic Quality: Adherent Slough Tendon Exposed: No Muscle Exposed: No Joint Exposed: No Bone Exposed: No Treatment Notes Wound #15R (Malleolus) Wound Laterality: Left, Medial Cleanser Wound Cleanser Discharge  Instruction: Cleanse the wound with wound cleanser prior to applying a clean dressing using gauze sponges, not tissue or cotton balls. Peri-Wound Care Sween Lotion (Moisturizing lotion) Discharge Instruction: Apply moisturizing lotion as directed Topical Primary Dressing Hydrofera Blue Ready Foam, 2.5 x2.5 in Discharge Instruction: Apply to wound bed as instructed Secondary Dressing Woven Gauze Sponge, Non-Sterile 4x4 in Discharge Instruction: Apply over primary dressing as directed. Secured With Compression Wrap CoFlex TLC XL 2-layer Compression System 4x7 (in/yd) Discharge Instruction: Apply CoFlex 2-layer compression as directed. (alt for 4 layer) Compression Stockings Add-Ons Electronic Signature(s) Signed: 06/05/2021 5:15:33 PM By: [name] Entered By: [name] on 06/05/2021 10:33:47 -------------------------------------------------------------------------------- Vitals Details Patient Name: Date of Service: [name], [name] 06/05/2021 10:00 A M Medical Record Number: [id number] Patient Account Number: [id number] Date of Birth/Sex: Treating RN: November 12, 1955 (66 y.o. [name] Primary Care Kathryn Linarez: [name] Other Clinician: Referring Addysyn Fern: Treating Koichi Platte/Extender: [name] in Treatment: 62 Vital Signs Time Taken: 10:26 Temperature (F): 98.5 Height (in): 68 Pulse (bpm): 57 Respiratory Rate (breaths/min): 18 Blood Pressure (mmHg): 123/70 Reference Range: 80 - 120 mg / dl Electronic Signature(s) Signed: 06/05/2021 5:15:33 PM By: [name] Entered By: [name] on 06/05/2021 10:26:38

## 2021-06-05 NOTE — Progress Notes (Addendum)
[name], [name] ([id number]) Visit Report for 06/05/2021 Chief Complaint Document Details Patient Name: Date of Service: [facility] [name], [name] 06/05/2021 10:00 A M Medical Record Number: [id number] Patient Account Number: [id number] Date of Birth/Sex: Treating RN: 1955/12/19 (66 y.o. [name] Primary Care Provider: [name] Other Clinician: Referring Provider: Treating Provider/Extender: [name] in Treatment: 30 Information Obtained from: Patient Chief Complaint patient is been followed long-term in this clinic for venous insufficiency ulcers with inflammation, hypertension and ulceration over the medial ankle bilaterally. 01/17/2020; this is a patient who is here for review of postoperative wounds on the left lateral ankle and recurrence of venous stasis ulceration on the left medial Electronic Signature(s) Signed: 06/05/2021 10:18:20 AM By: [name] PA-C Entered By: [name] on 06/05/2021 10:18:20 -------------------------------------------------------------------------------- Debridement Details Patient Name: Date of Service: [name]. 06/05/2021 10:00 A M Medical Record Number: [id number] Patient Account Number: [id number] Date of Birth/Sex: Treating RN: [birth year] (66 y.o. [name] Primary Care Provider: [name] Other Clinician: Referring Provider: Treating Provider/Extender: [name] in Treatment: 72 Debridement Performed for Assessment: Wound #15R Left,Medial Malleolus Performed By: Physician [name], PA Debridement Type: Debridement Severity of Tissue Pre Debridement: Fat layer exposed Level of Consciousness (Pre-procedure): Awake and Alert Pre-procedure Verification/Time Out Yes - 10:54 Taken: Start Time: 10:55 Pain Control: Lidocaine 5% topical ointment T Area Debrided (L x W): otal 1.8 (cm) x 1.2 (cm) = 2.16 (cm) Tissue and other material debrided:  Non-Viable, Slough, Subcutaneous, Slough Level: Skin/Subcutaneous Tissue Debridement Description: Excisional Instrument: Curette Specimen: Swab, Number of Specimens T aken: 1 Bleeding: Minimum Hemostasis Achieved: Pressure End Time: 11:03 Response to Treatment: Procedure was tolerated well Level of Consciousness (Post- Awake and Alert procedure): Post Debridement Measurements of Total Wound Length: (cm) 1.8 Width: (cm) 1.2 Depth: (cm) 0.1 Volume: (cm) 0.17 Character of Wound/Ulcer Post Debridement: Stable Severity of Tissue Post Debridement: Fat layer exposed Post Procedure Diagnosis Same as Pre-procedure Electronic Signature(s) Signed: 06/05/2021 4:51:59 PM By: [name] PA-C Signed: 06/05/2021 5:15:33 PM By: [name] Entered By: [name] on 06/05/2021 11:04:56 -------------------------------------------------------------------------------- HPI Details Patient Name: Date of Service: [name]. 06/05/2021 10:00 A M Medical Record Number: [id number] Patient Account Number: [id number] Date of Birth/Sex: Treating RN: June 10, 1955 (66 y.o. [name] Primary Care Provider: [name] Other Clinician: Referring Provider: Treating Provider/Extender: [name] in Treatment: 3 History of Present Illness HPI Description: the remaining wound is over the left medial ankle. Similar wound over the right medial ankle healed largely with use of Apligraf. Most recently we have been using Hydrofera Blue over this wound with considerable improvement. The patient has been extensively worked up in the past for her venous insufficiency and she is not a candidate for antireflux surgery although I have none of the details available currently. 08/24/14; considerable improvement today. About 50% of this wound areas now epithelialized. The base of the wound appears to be healthier granulation.as opposed to last week when she had deteriorated a  considerable improvement 08/17/14; unfortunately the wound has regressed somewhat. The areas of epithelialization from the superior aspect are not nearly as healthy as they were last week. The patient thinks her Hydrofera Blue slipped. 09/07/14; unfortunately the area has markedly regressed in the 2 weeks since I've seen this. There is an odor surrounding erythema. The healthy granulation tissue that we had at  the base of the wound now is a dusky color. The nurse reports green drainage 09/14/14; the area looks somewhat better than last week. There is less erythema and less drainage. The culture I did did not show any growth. Nevertheless I think it is better to continue the Cipro and doxycycline for a further week. The remaining wound area was debridement. 09/21/14. Wound did not require debridement last week. Still less erythema and less drainage. She can complete her antibiotics. The areas of epithelialization in the superior aspect of the wound do not look as healthy as they did some weeks ago 10/05/14 continued improvement in the condition of this wound. There is advancing epithelialization. Less aggressive debridement required 10/19/14 continued improvement in the condition and volume of this wound. Less aggressive debridement to the inferior part of this to remove surface slough and fibrinous eschar 11/02/14 no debridement is required. The surface granulation appears healthy although some of her islands of epithelialization seem to have regressed. No evidence of infection 11/16/14; lites surface debridement done of surface eschar. The wound does not look to be unhealthy. No evidence of infection. Unfortunately the patient has had podiatry issues in the right foot and for some reason has redeveloped small surface ulcerations in the medial right ankle. Her original presentation involved wounds in this area 11/23/14 no debridement. The area on the right ankle has enlarged. The left ankle wound appears stable  in terms of the surface although there is periwound inflammation. There has been regression in the amount of new skin 11/30/14 no debridement. Both wound areas appear healthy. There was no evidence of infection. The the new area on the right medial ankle has enlarged although that both the surfaces appear to be stable. 12/07/14; Debridement of the right medial ankle wound. No no debridement was done on the left. 12/14/14 no major change in and now bilateral medial ankle wounds. Both of these are very painful but the no overt evidence of infection. She has had previous venous ablation 12/21/14; patient states that her right medial ankle wound is considerably more painful last week than usual. Her left is also somewhat painful. She could not tolerate debridement. The right medial ankle wound has fibrinous surface eschar 12/28/14 this is a patient with severe bilateral venous insufficiency ulcers. For a considerable period of time we actually had the one on the right medial ankle healed however this recently opened up again in June. The left medial ankle wound has been a refractory area with some absent flows. We had some success with Hydrofera Blue on this area and it literally closed by 50% however it is recently opened up Foley. Both of these were debridement today of surface eschar. She tolerates this poorly 01/25/15: No change in the status of this. Thick adherent escar. Very poor tolerance of any attempt at debridement. I had healed the right medial malleolus wound for a considerable amount of time and had the left one down to about 50% of the volume although this is totally regressed over the last 48 weeks. Further the right leg has reopened. she is trying to make a appointment with pain and vascular, previous ablations with Dr. Aleda Grana. I do not believe there is an arterial insufficiency issue here 02/01/15 the status of the adherent eschar bilaterally is actually improved. No debridement was done.  She did not manage to get vascular studies done 02/08/15 continued debridement of the area was done today. The slough is less adherent and comes off with less pressure. There  is no surrounding infection peripheral pulses are intact 02/15/15 selective debridement with a disposable curette. Again the slough is less adherent and comes off with less difficulty. No surrounding infection peripheral pulses are intact. 02/22/15 selective debridement of the right medial ankle wound. Slough comes off with less difficulty. No obvious surrounding infection peripheral pulses are intact I did not debridement the one on the left. Both of these are stable to improved 03/01/15 selective debridement of both wound areas using a curette to. Adherent slough cup soft with less difficulty. No obvious surrounding infection. The patient tells me that 2 days ago she noted a rash above the right leg wrap. She did not have this on her lower legs when she change this over she arrives with widespread left greater than right almost folliculitis-looking rash which is extremely pruritic. I don't see anything to culture here. There is no rash on the rest of her body. She feels well systemically. 03/08/15; selective debridement of both wounds using a curette. Base of this does not look unhealthy. She had limegreen drainage coming out of the left leg wound and describes a lot of drainage. The rash on her left leg looks improved to. No cultures were done. 03/22/15; patient was not here last week. Basal wounds does not look healthy and there is no surrounding erythema. No drainage. There is still a rash on the left leg that almost looks vasculitic however it is clearly limited to the top of where the wrap would be. 04/05/15; on the right required a surgical debridement of surface eschar and necrotic subcutaneous tissue. I did not debridement the area on the left. These continue to be large open wounds that are not changing that much. We were  successful at one point in healing the area on the right, and at the same time the area on the left was roughly half the size of current measurements. I think a lot of the deterioration has to do with the prolonged time the patient is on her feet at work 04/19/15 I attempted-like surface debridement bilaterally she does not tolerate this. She tells me that she was in allergic care yesterday with extreme pain over her left lateral malleolus/ankle and was told that she has an "sprain" 05/03/15; large bilateral venous insufficiency wounds over the medial malleolus/medial aspect of her ankles. She complains of copious amounts of drainage and his usual large amounts of pain. There is some increasing erythema around the wound on the right extending into the medial aspect of her foot to. historically she came in with these wounds the right one healed and the left one came down to roughly half its current size however the right one is reopened and the left is expanded. 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 05/10/15 large bilateral venous insufficiency wounds. There is less adherence surface left however the surface culture that I did last week grew pseudomonas therefore bilateral selective debridement score necessary. There is surrounding erythema. The patient describes severe bilateral drainage and a lot of pain in the left ankle. Apparently her podiatrist was were ready to do a cortisone shot 05/17/15; the patient complains of pain and again copious amounts of drainage. 05/24/15; we used Iodo flex last week. Patient notes considerable improvement in wound drainage. Only needed to change this once. 05/31/15; we continued Iodoflex; the base of these large wounds bilaterally is not too bad but there is probably likely a significant  bioburden here. I would like to debridement just doesn't tolerate it. 06/06/14 I would like to continue the Iodoflex although  she still hasn't managed to obtain supplies. She has bilateral medial malleoli or large wounds which are mostly superficial. Both of them are covered circumferentially with some nonviable fibrinous slough although she tolerates debridement very poorly. She apparently has an appointment for an ablation on the right leg by interventional radiology. 06/14/15; the patient arrives with the wounds and static condition. We attempted a debridement although she does not do well with this secondary to pain. I 07/05/15; wounds are not much smaller however there appears to be a cleaner granulating base. The left has tight fibrinous slough greater than the right. Debridement is tolerated poorly due to pain. Iodoflex is done more for these wounds in any of the multitude of different dressings I have tried on the left 1 and then subsequently the right. 07/12/15; no change in the condition of this wound. I am able to do an aggressive debridement on the right but not the left. She simply cannot tolerate it. We have been using Iodoflex which helps somewhat. It is worthwhile remembering that at one point we healed the right medial ankle wound and the left was about 25% of the current circumference. We have suggested returning to vascular surgery for review of possible further ablations for one reason or another she has not been able to do this. 07/26/15 no major change in the condition of either wound on her medial ankle. I did not attempt to debridement of these. She has been aggressively scrubbing these while she is in the shower at home. She has her supply of Iodoflex which seems to have done more for these wounds then anything I have put on recently. 08/09/15 wound area appears larger although not verified by measurements. Using Iodoflex 09/05/2015 -- she was here for avisit today but had significant problems with the wound and I was asked to see her for a physician opinion. I have summarize that this lady has had surgery  on her left lower extremity about 10 years ago where the possible veins stripping was done. She has had an opinion from interventional radiology around November 2016 where no further sclerotherapy was ordered. The patient works 12 hours a day and stands on a concrete floor with work boots and is unable to get the proper compression she requires and cannot elevate her limbs appropriately at any given time. She has recently grown Pseudomonas from her wound culture but has not started her ciprofloxacin which was called in for her. 09/13/15 this continues to be a difficult situation for this patient. At one point I had this wound down to a 1.5 x 1.5" wound on her left leg. This is deteriorated and the right leg has reopened. She now has substantial wounds on her medial calcaneus, malleoli and into her lower leg. One on the left has surface eschar but these are far too painful for me to debridement here. She has a vascular surgery appointment next week to see if anything can be done to help here. I think she has had previous ablations several years ago at [facility]. She has no major edema. She tells me that she did not get product last time [facility]] and went for several days without it. She continues to work in work boots 12 hours a day. She cannot get compression/4-layer under her work boots. 09/20/15 no major change. Periwound edema control was not very good. Her point  with pain and vascular is next Wednesday the 25th 09/28/15; the patient is seen vascular surgery and is apparently scheduled for repeat duplex ultrasounds of her bilateral lower legs next week. 10/05/15; the patient was seen by Dr. Doren Custard of vascular surgery. He feels that she should have arterial insufficiency excluded as cause/contributed to her nonhealing stage she is therefore booked for an arteriogram. She has apparently monophasic signals in the dorsalis pedis pulses. She also of course has known severe chronic venous insufficiency  with previous procedures as noted previously. I had another long discussion with the patient today about her continuing to work 12 hour shifts. I've written her out for 2 months area had concerns about this as her work location is currently undergoing significant turmoil and this may lead to her termination. She is aware of this however I agree with her that she simply cannot continue to stand for 12 hours multiple days a week with the substantial wound areas she has. 10/19/15; the Dr. Doren Custard appointment was largely for an arteriogram which was normal. She does not have an arterial issue. He didn't make a comment about her chronic venous insufficiency for which she has had previous ablations. Presumably it was not felt that anything additional could be done. The patient is now out of work as I prescribed 2 weeks ago. Her wounds look somewhat less aggravated presumably because of this. I felt I would give debridement another try today 10/25/15; no major change in this patient's wounds. We are struggling to get her product that she can afford into her own home through her insurance. 11/01/15; no major change in the patient's wounds. I have been using silver alginate as the most affordable product. I spoke to Dr. Marla Roe last week with her requested take her to the OR for surgical debridement and placement of ACEL. Dr. Marla Roe told me that she would be willing to do this however [facility] will not cover this, fortunately the patient has [place] healthcare of some variant 11/08/15; no major change in the patient's wounds. She has been completely nonviable surface that this but is in too much pain with any attempted debridement are clinic. I have arranged for her to see Dr. Marla Roe ham of plastic surgery and this appointment is on Monday. I am hopeful that they will take her to the OR for debridement, possible ACEL ultimately possible skin graft 11/22/15 no major change in the patient's wounds  over her bilateral medial calcaneus medial malleolus into the lower legs. Surface on these does not look too bad however on the left there is surrounding erythema and tenderness. This may be cellulitis or could him sleepy tinea. 11/29/15; no major changes in the patient's wounds over her bilateral medial malleolus. There is no infection here and I don't think any additional antibiotics are necessary. There is now plan to move forward. She sees Dr. Marla Roe in a week's time for preparation for operative debridement and ACEL placement I believe on 7/12. She then has a follow-up appointment with Dr. Marla Roe on 7/21 12/28/15; the patient returns today having been taken to the [place] by Dr. Marla Roe 12/12/15 she underwent debridement, intraoperative cultures [which were negative]. She had placement of a wound VAC. Parent really ACEL was not available to be placed. The wound VAC foam apparently adhered to the wound since then she's been using silver alginate, Xeroform under Ace wraps. She still says there is a lot of drainage and a lot of pain 01/31/16; this is a patient  I see monthly. I had referred her to Dr. Marla Roe him of plastic surgery for large wounds on her bilateral medial ankles. She has been to the OR twice once in early July and once in early August. She tells me over the last 3 weeks she has been using the wound VAC with ACEL underneath it. On the right we've simply been using silver alginate. Under Kerlix Coban wraps. 02/28/16; this is a patient I'm currently seeing monthly. She is gone on to have a skin graft over her large venous insufficiency ulcer on the left medial ankle. This was done by Dr. Marla Roe him. The patient is a bit perturbed about why she didn't have one on her right medial ankle wound. She has been using silver alginate to this. 03/06/16; I received a phone call from her plastic surgery Dr. Marla Roe. She expressed some concern about the viability of the skin graft she did  on the left medial ankle wound. Asked me to place Endoform on this. She told me she is not planning to do a subsequent skin graft on the right as the left one did not take very well. I had placed Hydrofera Blue on the right 03/13/16; continue to have a reasonably healthy wound on the right medial ankle. Down to 3 mm in terms of size. There is epithelialization here. The area on the left medial ankle is her skin graft site. I suppose the last week this looks somewhat better. She has an open area inferiorly however in the center there appears to be some viable tissue. There is a lot of surface callus and eschar that will eventually need to come off however none of this looked to be infected. Patient states that the is able to keep the dressing on for several days which is an improvement. 03/20/16 no major change in the circumference of either wound however on the left side the patient was at Dr. Eusebio Friendly office and they did a debridement of left wound. 50% of the wound seems to be epithelialized. I been using Endoform on the left Hydrofera Blue in the right 03/27/16; she arrives today with her wound is not looking as healthy as they did last week. The area on the right clearly has an adherent surface to this a very similar surface on the left. Unfortunately for this patient this is all too familiar problem. Clearly the Endoform is not working and will need to change that today that has some potential to help this surface. She does not tolerate debridement in this clinic very well. She is changing the dressing wants 04/03/16; patient arrives with the wounds looking somewhat better especially on the right. Dr. Migdalia Dk change the dressing to silver alginate when she saw her on Monday and also sold her some compression socks. The usefulness of the latter is really not clear and woman with severely draining wounds. 04/10/16; the patient is doing a bit of an experiment wearing the compression stockings that  Dr. Migdalia Dk provided her to her left leg and the out of legs based dressings that we provided to the right. 05/01/16; the patient is continuing to wear compression stockings Dr. Migdalia Dk provided her on the left that are apparently silver impregnated. She has been using Iodoflex to the right leg wound. Still a moderate amount of drainage, when she leaves here the wraps only last for 4 days. She has to change the stocking on the left leg every night 05/15/16; she is now using compression stockings bilaterally provided by Dr. Marla Roe.  She is wearing a nonadherent layer over the wounds so really I don't think there is anything specific being done to this now. She has some reduction on the left wound. The right is stable. I think all healing here is being done without a specific dressing 06/09/16; patient arrives here today with not much change in the wound certainly in diameter to large circular wounds over the medial aspect of her ankle bilaterally. Under the light of these services are certainly not viable for healing. There is no evidence of surrounding infection. She is wearing compression stockings with some sort of silver impregnation as prescribed by Dr. Marla Roe. She has a follow-up with her tomorrow. 06/30/16; no major change in the size or condition of her wounds. These are still probably covered with a nonviable surface. She is using only her purchase stockings. She did see Dr. Marla Roe who seemed to want to apply Dakin's solution to this I'm not extreme short what value this would be. I would suggest Iodoflex which she still has at home. 07/28/16; I follow Mrs. Donnellan episodically along with Dr. Marla Roe. 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 She has been applying a topical collagen ointment to both wounds with Adaptic. I don't think Dr. Marla Roe is planning to take her back to the OR. 08/19/16; I follow Mrs. Jeneen Rinks on a  monthly basis along with Dr. Marla Roe of plastic surgery. 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 I been following her for a number of years. At one point I was able to get the right medial malleolus wound to heal and had the left medial malleolus down to about half its current size however and I had to send her to plastic surgery for an operative debridement. Since then things have been stable to slightly improve the area on the right is slightly better one in the left about the same although there is much less adherent surface than I'm used to with this patient. She is using some form of liquid collagen gel that Dr. Marla Roe provided a Kerlix cover with the patient's own pressure stockings. She tells me that she has extreme pain in both ankles and along the lateral aspect of both feet. She has been unable to work for some period of time. She is telling me she is retiring at the beginning of April. She sees Dr. Doran Durand of orthopedics next week 09/22/16; patient has not seen Dr. Marla Roe since the last time she is here. I'm not really sure what she is using to the wounds other than bits and pieces of think she had left over including most recently Hydrofera Blue. She is using juxtalite stockings. She is having difficulty with her husband's recent illness "stroke". She is having to transport him to various doctors appointments. Dr. Marla Roe left her the option of a repeat debridement with ACEL however she has not been able to get the time to follow-up on this. She continues to have a fair amount of drainage out of these wounds with certainly precludes leaving dressings on all week 10/13/16; patient has not seen Dr. Marla Roe since she was last in our clinic. I'm not really sure what she is doing with the wounds, we did try to get her [facility] and I think she is actually using this most of the time. Because of drainage she  states she has to change this every second day although this  is an improvement from what she used to do. She went to see Dr. Doran Durand who did not think she had a muscular issue with regards to her feet, he referred her to a neurologist and I think the appointment is sometime in June. I changed her back to Iodoflex which she has used in the past but not recently. 11/03/16; the patient has been using Iodoflex although she ran out of this. Still claims that there is a lot of drainage although the wound does not look like this. No surrounding erythema. She has not been back to see Dr. Marla Roe 11/24/16; the patient has been using Iodoflex again but she ran out of it 2 or 3 days ago. There is no major change in the condition of either one of these wounds in fact they are larger and covered in a thick adherent surface slough/nonviable tissue especially on the left. She does not tolerate mechanical debridement in our clinic. Going back to see Dr. Marla Roe of plastic surgery for an operative debridement would seem reasonable. 12/15/16; the patient has not been back to see Dr. Marla Roe. She is been dealing with a series of illnesses and her husband which of monopolized her time. She is been using Sorbact which we largely supplied. She states the drainage is bad enough that it maximum she can go 2-3 days without changing the dressing 01/12/2017 -- the patient has not been back for about 4 weeks and has not seen Dr. Marla Roe not does she have any appointment pending. 01/23/17; patient has not seen Dr. Marla Roe even though I suggested this previously. She is using Santyl that was suggested last week by Dr. Con Memos this Cost her $16 through her insurance which is indeed surprising 02/12/17; continuing Santyl and the patient is changing this daily. A lot of drainage. She has not been back to see plastic surgery she is using an Ace wrap. Our intake nurse suggested wrap around stockings which would make a good  reasonable alternative 02/26/17; patient is been using Santyl and changing this daily due to drainage. She has not been to see plastic surgery she uses in April Ace wrap to control the edema. She did obtain extremitease stockings but stated that the edema in her leg was to big for these 03/20/17; patient is using Santyl and Anasept. Surfaces looked better today the area on the right is actually measuring a little smaller. She has states she has a lot of pain in her feet and ankles and is asking for a consult to pain control which I'll try to help her with through our case manager. 04/10/17; the patient arrives with better-looking wound surfaces and is slightly smaller wound on the left she is using a combination of Santyl and Anasept. She has an appointment or at least as started in the pain control center associated with [facility] 05/14/17; this is a patient who I followed for a prolonged period of time. She has venous insufficiency ulcers on her bilateral medial ankles. At one point I had this down to a much smaller wound on the left however these reopened and we've never been able to get these to heal. She has been using Santyl and Anasept gel although 2 weeks ago she ran out of the Anasept gel. She has a stable appearance of the wound. She is going to the wound care clinic at [facility]. They wanted do a nerve block/spinal block although she tells me she is reluctant to go forward with that. 05/21/17; this is a  patient I have followed for many years. She has venous insufficiency ulcers on her bilateral medial ankles. Chronic pain and deformity in her ankles as well. She is been to see plastic surgery as well as orthopedics. Using PolyMem AG most recently/Kerramax/ABDs and 2 layer compression. She has managed to keep this on and she is coming in for a nurse check to change the dressing on Tuesdays, we see her on Fridays 06/05/17; really quite a good looking surface and the area especially  on the right medial has contracted in terms of dimensions. Well granulated healthy-looking tissue on both sides. Even with an open curet there is nothing that even feels abnormal here. This is as good as I've seen this in quite some time. We have been using PolyMem AG and bringing her in for a nurse check 06/12/17; really quite good surface on both of these wounds. The right medial has contracted a bit left is not. We've been using PolyMem and AG and she is coming in for a nurse visit 06/19/17; we have been using PolyMem AG and bringing her in for a nurse check. Dimensions of her wounds are not better but the surfaces looked better bilaterally. She complained of bleeding last night and the left wound and increasing pain bilaterally. She states her wound pain is more neuropathic than just the wounds. There was some suggestion that this was radicular from her pain management doctor in talking to her it is really difficult to sort this out. 06/26/17; using PolyMem and AG and bringing her in for a nurse check as All of this and reasonably stable condition. Certainly not improved. The dimensions on the lateral part of the right leg look better but not really measuring better. The medial aspect on the left is about the same. 07/03/16; we have been using PolyMen AG and bringing her in for a nurse check to change the dressings as the wounds have drainage which precludes once weekly changing. We are using all secondary absorptive dressings.our intake nurse is brought up the idea of using a wound VAC/snap VAC on the wound to help with the drainage to see if this would result in some contraction. This is not a bad idea. The area on the right medial is actually looking smaller. Both wounds have a reasonable-looking surface. There is no evidence of cellulitis. The edema is well controlled 07/10/17; the patient was denied for a snap VAC by her insurance. The major issue with these wounds continues to be drainage. We are  using wicked PolyMem AG and she is coming in for a nurse visit to change this. The wounds are stable to slightly improved. The surface looks vibrant and the area on the right certainly has shrunk in size but very slowly 07/17/17; the patient still has large wounds on her bilateral medial malleoli. Surface of both of these wounds looks better. The dimensions seem to come and go but no consistent improvement. There is no epithelialization. We do not have options for advanced treatment products due to insurance issues. They did not approve of the wound VAC to help control the drainage. More recently we've been using PolyMem and AG wicked to allow drainage through. We have been bringing her in for a nurse visit to change this. We do not have a lot of options for wound care products and the home again due to insurance issues 07/24/17; the patient's wound actually looks somewhat better today. No drainage measurements are smaller still healthy-looking surface. We used silver collagen under  PolyMen started last week. We have been bringing her in for a dressing change 07/31/17; patient's wound surface continued to look better and I think there is visible change in the dimensions of the wound on the right. Rims of epithelialization. We have been using silver collagen under PolyMen and bringing her in for a dressing change. There appears to be less drainage although she is still in need of the dressing change 08/07/17. Patient's wound surface continues to look better on both sides and the area on the right is definitely smaller. We have been using silver collagen and PolyMen. She feels that the drainage has been it has been better. I asked her about her vascular status. She went to see Dr. Aleda Grana at [facility] and had some form of ablation. I don't have much detail on this. I haven't my notes from 2016 that she was not a candidate for any further ablation but I don't have any more information on this. We had  referred her to vein and vascular I don't think she ever went. He does not have a history of PAD although I don't have any information on this either. We don't even have ABIs in our record 08/14/17; we've been using silver collagen and PolyMen cover. And putting the patient and compression. She we are bringing her in as a nurse visit to change this because ofarge amount of drainage. We didn't the ABIs in clinic today since they had been done in many moons 1.2 bilaterally. She has been to see vein and vascular however this was at [facility] and she had ablation although I really don't have any information on this all seemed biking get a report. She is also been operatively debrided by plastic surgery and had a cell placed probably 8-12 months ago. This didn't have a major effect. We've been making some gains with current dressings 08/19/17-She is here in follow-up evaluation for bilateral medial malleoli ulcers. She continues to tolerate debridement very poorly. We will continue with recently changed topical treatment; if no significant improvement may consider switching to Iodosorb/Iodoflex. 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 She has been using silver collagen and PolyMem. I believe she has been used and tried on Iodoflex before. 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 These have been very refractory over time. We have been using silver collagen and PolyMen. She is coming in once a week for a doctor's and once a week for nurse visits. We are actually making some progress 09/18/17; the patient's wounds are smaller especially on the right medial. She arrives today to upset to consider even washing these off with Anasept which I think is been part of the  reason this is been closing. We've been using collagen covered in PolyMen otherwise. It is noted that she has a small area of folliculitis on the right medial calf that. As we are wrapping her legs I'll give her a short course of doxycycline to make sure this doesn't amount to anything. She is a long list of complaints today including imbalance, shortness of breath on exertion, inversion of her left ankle. With regards to the latter complaints she is been to see orthopedics and they offered her a tendon release surgery I believe but wanted her wounds to be closed first.  I have recommended she go see her primary physician with regards to everything else. 09/25/17; patient's wounds are about the same size. We have made some progress bilaterally although not in recent weeks. She will not allow me T wash these wounds with Anasept even if she is doing her cell. Wheeze we've been using collagen covered in PolyMen. Last week she had a small area of folliculitis this is now opened into a small wound. She completed 5 days of trimethoprim sulfamethoxazole 10/02/17; unfortunately the area on her left medial ankle is worse with a larger wound area towards the Achilles. The patient complains of a lot of pain. She will not allow debridement although visually I don't think there is anything to debridement in any case. We have been using silver collagen and PolyMen for several months now. Initially we are making some progress although I'm not really seeing that today. We will move back to [facility]. His admittedly this is a bit of a repeat however I'm hoping that his situation is different now. The patient tells me she had her leg on the left give out on her yesterday this is process some pain. 10/09/17; the patient is seen twice a week largely because of drainage issues coming out of the chronic medial bimalleolar wounds that are chronic. Last week the dimensions of the one on the left looks a little larger I  changed her to [facility]. She comes in today with a history of terrible pain in the bilateral wound areas. She will not allow debridement. She will not even allow a tissue culture. There is no surrounding erythema no no evidence of cellulitis. We have been putting her Kerlix Coban man. She will not allow more aggressive compression as there was a suggestion to put her in 3 layer wraps. 10/16/17; large wounds on her bilateral medial malleoli. These are chronic. Not much change from last week. The surface looks have healthy but absolutely no epithelialization. A lot of pain little less so of drainage. She will not allow debridement or even washing these off in the vigorous fashion with Anasept. 10/23/17; large wounds on her bilateral malleoli which are chronic. Some improvement in terms of size perhaps on the right since last time I saw these. She states that after we increased the 3 layer compression there was some bleeding, when she came in for a nurse visit she did not want 3 layer compression put back on about our nurse managed to convince her. She has known chronic venous visit issues and I'm hoping to get her to tolerate the 3 layer compression. using Hydrofera Blue 10/30/17; absolutely no change in the condition of either wound although we've had some improvement in dimensions on the right.. Attempted to put her in 3 layer compression she didn't tolerated she is back in 2 layer compression. We've been using Hydrofera Blue We looked over her past records. She had venous reflux studies in November 2016. There was no evidence of deep venous reflux on the right. Superficial vein did not show the greater saphenous vein at think this is been previously ablated the small saphenous vein was within normal limits. The left deep venous system showed no DVT the vessels were positive for deep venous reflux in the posterior tibial veins at the ankle. The greater saphenous vein was surgically absent small  saphenous vein was within normal limits. She went to vein and vascular at [facility]. I believe she had an ablation on the left greater saphenous vein. I'll  update her reflux studies perhaps ever reviewed by vein and vascular. We've made absolutely no progress in these wounds. Will also try to read and TheraSkins through her insurance 11/06/17; W the patient apparently has a 2 week follow-up with vein and vascular I like him to review the whole issue with regards to her previous vascular workup by Dr. Aleda Grana. We've really made no progress on these wounds in many months. She arrives today with less viable looking surface on the left medial ankle wound. This was apparently looking about the same on Tuesday when she was here for nurse visit. 11/13/17; deep tissue culture I did last time of the left lower leg showed multiple organisms without any predominating. In particular no Staphylococcus or group A strep were isolated. We sent her for venous reflux studies. She's had a previous left greater saphenous vein stripping and I think sclerotherapy of the right greater saphenous vein. She didn't really look at the lesser saphenous vein this both wounds are on the medial aspect. She has reflux in the common femoral vein and popliteal vein and an accessory vein on the right and the common femoral vein and popliteal vein on the left. I'm going to have her go to see vein and vascular just the look over things and see if anything else beside aggressive compression is indicated here. We have not been able to make any progress on these wounds in spite of the fact that the surface of the wounds is never look too bad. 11/20/17; no major change in the condition of the wounds. Patient reports a large amount of drainage. She has a lot of complaints of pain although enlisting her today I wonder if some of this at least his neuropathic rather than secondary to her wounds. She has an appointment with vein and vascular  on 12/30/17. The refractory nature of these wounds in my mind at least need vein and vascular to look over the wounds the recent reflux studies we did and her history to see if anything further can be done here. I also note her gait is deteriorated quite a bit. 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 She has a bilateral Trendelenburg gait. I wonder if this is neuropathic or perhaps multilevel radicular. 11/27/17; her wounds actually looks slightly better. Healthy-looking granulation tissue a scant amount of epithelialization. [place] healthcare will not pay for [employer]. They will play for tri layer Oasis and Dermagraft. This is not a diabetic ulcer. We'll try for the tri layer Oasis. She still complains of some drainage. She has a vein and vascular appointment on 12/30/17 12/04/17; the wounds visually look quite good. Healthy-looking granulation with some degree of epithelialization. We are still waiting for response to our request for trial to try layer Oasis. Her appointment with vascular to review venous and arterial issues isn't sold the end of July 7/31. Not allow debridement or even vigorous cleansing of the wound surface. 12/18/17; slightly smaller especially on the right. 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 We've been using Hydrofera Blue. We still are looking into triple layer Oasis through her insurance 01/08/18 on evaluation today patient's wound actually appears to be showing signs of good improvement at this point in time. She has been tolerating the dressing changes without complication. Fortunately there does not appear to be any evidence of infection at this point in time. We have been utilizing silver nitrate which does seem to be of benefit for her which is also good  news. Overall I'm very happy with how things seem to be both regards appearance as well as measurement. Patient did see Dr. Bridgett Larsson for evaluation on 12/30/17. In his assessment he  felt that stripping would not likely add much more than chronic compression to the patient's healing process. His recommendation was to follow-up in three months with Dr. Doren Custard if she hasn't healed in order to consider referral back to you and see vascular where she previously was in a trial and was able to get her wound to heal. I'll be see what she feels she when you staying compression and he reiterated this as well. 01/13/18 on evaluation today patient appears to actually be doing very well in regard to her bilateral medial malleolus ulcers. She seems to have tolerated the chemical cauterization with silver nitrate last week she did have some pain through that evening but fortunately states that I'll be see since it seems to be doing better she is overall pleased with the progress. 01/21/18; really quite a remarkable improvement since I've last seen these wounds. We started using silver nitrate specially on the islands of hyper granulation which for some reason her around the wound circumference. This is really done quite nicely. Primary dressing Hydrofera Blue under 4 layer compression. She seems to be able to hold out without a nurse rewrap. Follow-up in 1 week 01/28/18; we've continued the hydrofera blue but continued with chemical cauterization to the wound area that we started about a month ago for irregular hyper granulation. She is made almost stunning improvement in the overall wound dimensions. I was not really expecting this degree of improvement in these chronic wounds 02/05/18; we continue with Hydrofera Bluebut of also continued the aggressive chemical cauterization with silver nitrate. We made nice progress with the right greater than left wound. 02/12/18. We continued with Hydrofera Blue after aggressive chemical cauterization with silver nitrate. We appear to be making nice progress with both wound areas 02/19/2018; we continue with [facility] after washing the wounds vigorously  with Anasept spray and chemical cauterization with silver nitrate. We are making excellent progress. The area on the right's just about closed 02/26/2018. The area on the left medial ankle had too much necrotic debris today. I used a #5 curette we are able to get most of the soft. I continued with the silver nitrate to the much smaller wound on the right medial ankle she had a new area on her right lower pretibial area which she says was due to a role in her compression 03/05/2018; both wound areas look healthy. Not much change in dimensions from last week. I continue to use silver nitrate and Hydrofera Blue. The patient saw Dr. Doren Custard of vein and vascular. He felt she had venous stasis ulcers. He felt based on her previous arteriogram she should have adequate circulation for healing. Also she has deep venous reflux but really no significant correctable superficial venous reflux at this time. He felt we should continue with conservative management including leg elevation and compression 04/02/2018; since we last saw this woman about a month ago she had a fall apparently suffered a pelvic fracture. I did not look up the x-ray. Nevertheless because of pain she literally was bedbound for 2 weeks and had home health coming out to change the dressing. Somewhat predictably this is resulted in considerable improvement in both wound areas. The right is just about closed on the medial malleolus and the left is about half the size. 04/16/2018; both her wounds continue  to go down in size. Using Hydrofera Blue. 05/07/18; both her wounds appeared to be improving especially on the right where it is almost closed. We are using Hydrofera Blue 05/14/2018; slightly worse this week with larger wounds. Surface on the left medial not quite as good. We have been using Hydrofera Blue 05/21/18; again the wounds are slightly larger. Left medial malleolus slightly larger with eschar around the circumference. We have been using  Hydrofera Blue undergoing a wraps for a prolonged period of time. This got a lot better when she was more recumbent due to a fall and a back injury. I change the primary dressing the silver alginate today. She did not tolerate a 4 layer compression previously although I may need to bring this up with her next time 05/28/2018; area on the left medial malleolus again is slightly larger with more drainage. Area on the right is roughly unchanged. She has a small area of folliculitis on the right medial just on the lower calf. This does not look ominous. 06/03/2018 left medial malleolus slightly smaller in a better looking surface. We used silver nitrate on this last time with silver alginate. The area on the right appears slightly smaller 1/10; left medial malleolus slightly smaller. Small open area on the right. We used silver nitrate and silver alginate as of 2 weeks ago. We continue with the wound and compression. These got a lot better when she was off her feet 1/17; right medial malleolus wound is smaller. The left may be slightly smaller. Both surfaces look somewhat better. 1/24; both wounds are slightly smaller. Using silver alginate under Unna boots 1/31; both wounds appear smaller in fact the area on the right medial is just about closed. Surface eschar. We have been using silver alginate under Unna boots. The patient is less active now spends let much less time on her feet and I think this is contributed to the general improvement in the wound condition 2/7; both wounds appear smaller. I was hopeful the right medial would be closed however there there is still the same small open area. Slight amount of surface eschar on the left the dimensions are smaller there is eschar but the wound edges appear to be free. We have been using silver alginate under Unna boot's 2/14; both wounds once again measure smaller. Circumferential eschar on the left medial. We have been using silver alginate under Unna  boots with gradual improvement 2/21; the area on the right medial malleolus has healed. The area on the left is smaller. We have been using silver alginate and Unna boots. We can discharge wrapping the right leg she has 20/30 stockings at home she will need to protect the scar tissue in this area 2/28; the area on the right medial malleolus remains closed the patient has a compression stocking. The area on the left is smaller. We have been using silver alginate and Unna boots. 3/6 the area on the right medial ankle remains closed. Good edema control noted she is using her own compression stocking. The area on the left medial ankle is smaller. We have been managing this with silver alginate and Unna boots which we will continue today. 3/13; the area on the right medial ankle remains closed and I'm declaring it healed today. When necessary the left is about the same still a healthy-looking surface but no major change and wound area. No evidence of infection and using silver alginate under unna and generally making considerable improvement 3/27 the area on the right  medial ankle remains closed the area on the left is about the same as last week. Certainly not any worse we have been using silver alginate under an Unna boot 4/3; the area on the right medial ankle remains closed per the patient. We did not look at this wound. The wound on the left medial ankle is about the same surface looks healthy we have been using silver alginate under an Unna boot 4/10; area on the right medial ankle remains closed per the patient. We did not look at this wound. The wound on the left medial ankle is slightly larger. The patient complains that the [facility] caused burning pain all week. She also told us that she was a lot more active this week. Changed her back to silver alginate 4/17; right medial ankle still closed per the patient. Left medial ankle is slightly larger. Using silver alginate. She did not  tolerate Hydrofera Blue on this area 4/24; right medial ankle remains closed we have not look at this. The left medial ankle continues to get larger today by about a centimeter. We have been using silver alginate under Unna boots. She complains about 4 layer compression as an alternative. She has been up on her feet working on her garden 5/8; right medial ankle remains closed we did not look at this. The left medial ankle has increased in size about 100%. We have been using silver alginate under Unna boots. She noted increased pain this week and was not surprised that the wound is deteriorated 5/15; no major change in SA however much less erythema ( one week of doxy ocellulitis). 5/22-66 year old female returns at 1 week to the clinic for left medial ankle wound for which we have been using silver alginate under 3 layer compression She was placed on DOXY at last visit - the wound is wider at this visit. She is in 3 layer compression 5/29; change to [facility] last week. I had given her empiric doxycycline 2 weeks ago for a week. She is in 3 layer compression. She complains of a lot of pain and drainage on presentation today. 6/5; using Hydrofera Blue. I gave her doxycycline recently empirically for erythema and pain around the wound. Believe her cultures showed enterococcus which not would not have been well covered by doxycycline nevertheless the wound looks better and I don't feel specifically that the enterococcus needs to be covered. She has a new what looks like a wrap injury on her lateral left ankle. 6/12; she is using Hydrofera Blue. She has a new area on the left anterior lower tibial area. This was a wrap injury last week. 6/19; the patient is using Hydrofera Blue. She arrived with marked inflammation and erythema around the wound and tenderness. 12/01/18 on evaluation today patient appears to be doing a little bit better based on what I'm hearing from the standpoint of lassos evaluation  to this as far as the overall appearance of the wound is concerned. Then sometime substandard she typically sees Dr. Dellia Nims. Nonetheless overall very pleased with the progress that she's made up to this point. No fevers, chills, nausea, or vomiting noted at this time. 7/10; some improvement in the surface area. Aggressively debrided last week apparently. I went ahead with the debridement today although the patient does not tolerate this very well. We have been using Iodoflex. Still a fair amount of drainage 7/17; slightly smaller. Using Iodoflex. 7/24; no change from last week in terms of surface area. We have been using  Iodoflex. Surface looks and continues to look somewhat better 7/31; surface area slightly smaller better looking surface. We have been using Iodoflex. This is under Unna boot compression 8/7-Patient presents at 1 week with Unna boot and Iodoflex, wound appears better 8/14-Patient presents at 1 week with Iodoflex, we use the Unna boot, wound appears to be stable better.Patient is getting Botox treatment for the inversion of the foot for tendon release, Next week 8/21; we are using Iodoflex. Unna boot. The wound is stable in terms of surface area. Under illumination there is some areas of the wound that appear to be either epithelialized or perhaps this is adherent slough at this point I was not really clear. It did not wipe off and I was reluctant to debride this today. 8/28; we are using Iodoflex in an Unna boot. Seems to be making good improvement. 9/4; using Iodoflex and wound is slightly smaller. 9/18; we are using Iodoflex with topical silver nitrate when she is here. The wound continues to be smaller 10/2; patient missed her appointment last week due to GI issues. She left and Iodoflex based dressing on for 2 weeks. Wound is about the same size about the size of a dime on the left medial lower 10/9 we have been using Iodoflex on the medial left ankle wound. She has a new  superficial probable wrap injury on the dorsal left ankle 10/16; we have been using Hydrofera Blue since last week. This is on the left medial ankle 10/23; we have been using Hydrofera Blue since 2 weeks ago. This is on the left medial ankle. Dimensions are better 11/6; using Hydrofera Blue. I think the wound is smaller but still not closed. Left medial ankle 11/13; we have been using Hydrofera Blue. Wound is certainly no smaller this week. Also the surface not as good. This is the remanent of a very large area on her left medial ankle. 11/20; using Sorbact since last week. Wound was about the same in terms of size although I was disappointed about the surface debris 12/11; 3-week follow-up. Patient was on vacation. Wound is measuring slightly larger we have been using Sorbact. 12/18; wound is about the same size however surface looks better last week after debridement. We have been using Sorbact under compression 1/15 wound is probably twice the size of last time increased in length nonviable surface. We have been using Sorbact. She was running a mild fever and missed her appointment last week 1/22; the wound is come down in size but under illumination still a very adherent debris we have been Hydrofera Blue that I changed her to last week 1/29; dimensions down slightly. We have been using Hydrofera Blue 2/19 dimensions are the same however there is rims of epithelialization under illumination. Therefore more the surface area may be epithelialized 2/26; the patient's wound actually measures smaller. The wound looks healthy. We have been using Hydrofera Blue. I had some thoughts about running Apligraf then I still may do that however this looks so much better this week we will delay that for now 3/5; the wound is small but about the same as last week. We have been using Hydrofera Blue. No debridement is required today. 3/19; the wound is about the size of a dime. Healthy looking wound even under  illumination. We have been using Hydrofera Blue. No mechanical debridement is necessary 3/26; not much change from last week although still looks very healthy. We have been using Hydrofera Blue under Unna boots Patient was offered an  ankle fusion by podiatry but not until the wound heals with a proceed with this. 4/9; the patient comes in today with her original wound on the medial ankle looking satisfactory however she has some uncontrolled swelling in the middle part of her leg with 2 new open areas superiorly just lateral to the tibia. I think this was probably a wrap issue. She said she felt uncomfortable during the week but did not call in. We have been using Hydrofera Blue 4/16; the wound on the medial ankle is about the same. She has innumerable small areas superior to this across her mid tibia. I think this is probably folliculitis. She is also been working in the yard doing a lot of sweating 4/30; the patient issue on the upper areas across her mid tibia of all healed. I think this was excessive yard work if I remember. Her wound on the medial ankle is smaller. Some debris on this we have been using Hydrofera Blue under Unna boots 5/7; mid tibia. She has been using Hydrofera Blue under an Unna wrap. She is apparently going for her ankle surgery on June 3 10/28/19-Patient returns to clinic with the ankle wound, we are using Hydrofera Blue under Unna wrap, surgery is scheduled for her left foot for June 3 so she will be back for nurse visit next week READMISSION 01/17/2020 Mrs. Ruest is a 66 year old woman we have had in this clinic for a long period of time with severe venous hypertension and refractory wounds on her medial lower legs and ankles bilaterally. 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 When she was off her legs for a prolonged period example when she fell  and suffered a compression fracture things would heal up quite nicely. She is now retired and we managed to heal up the right medial leg wound. The left one was very tiny last time I saw this although still refractory. 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 It got to the point where this was interfering with her walking and she elected to proceed with a ankle arthrodesis to straighten her her ankle and leave her with a functional outcome for mobilization. The patient was referred to Dr. Doren Custard and really this took some time to arrange. Dr. Doren Custard saw her on 12/07/2019. Once again he verified that she had no arterial issues. She had previously had an angiogram several years ago. Follow-up ABIs on the left showed an ABI of 1.12 with triphasic waveforms and a TBI of 0.92. She is felt to have chronic deep venous insufficiency but I do not think it was felt that anything could be done from about this from an ablation point of view. At the time Dr. Doren Custard saw this patient the wounds actually look closed via the pictures in his clinic. The patient finally underwent her surgery on 12/15/2019. This went reasonably well and there was a good anatomic outcome. She developed a small distal wound dehiscence on the lateral part of the surgical wound. However more problematically she is developed recurrence of the wound on the medial left ankle. There are actually 2 wounds here one in the distal lower leg and 1 pretty much at the level of the medial malleolus. It is a more distal area that is more problematic. She has been  using Hydrofera Blue which started on Friday before this she was simply Ace wrapping. There was a culture done that showed Pseudomonas and she is on ciprofloxacin. A recent CNS on 8/11 was negative. The patient reports some pain but I generally think this is improving. She is using a cam boot completely  nonweightbearing using a walker for pivot transfers and a wheelchair 8/24; not much improvement unfortunately she has a surgical wound on the lateral part in the venous insufficiency wound medially. The bottom part of the medial insufficiency wound is still necrotic there is exposed tendon here. We have been using Hydrofera Blue under compression. Her edema control is however better 8/31; patient in for follow-up of his surgical wound on the lateral part of her left leg and chronic venous insufficiency ulcers medially. We put her back in compression last week. She comes in today with a complaint of 3 or 4 days worth of increasing pain. She felt her cam walker was rubbing on the area on the back of her heel. However there is intense erythema seems more likely she has cellulitis. She had 2 cultures done when she was seeing podiatry in the postop. One of them in late July showed Pseudomonas and she received a course of ciprofloxacin the other was negative on 8/11 she is allergic to penicillin with anaphylactoid complaints of hives oral swelling via information in epic 9/9; when I saw this patient last week she had intense anterior erythema around her wound on the right lateral heel and ankle and also into the right medial heel. Some of this was no doubt drainage and her walker boot however I was convinced she had cellulitis. I gave her Levaquin and Bactrim she is finishing up on this now. She is following up with Dr. [name] he saw her yesterday. He is taken her out of the walking boot of course she is still nonweightbearing. Her x-ray was negative for any worrisome features such as soft tissue air etc. Things are a lot better this week. She has home health. We have been using Hydrofera Blue under an [employer] which she put back on yesterday. I did not wrap her last week 9/17; her surrounding skin looks a lot better. In fact the area on the left lateral ankle has just a scant amount of eschar. The only  remaining wound is the large area on the left medial ankle. Probably about 60% of this is healthy granulation at the surface however she has a significant divot distally. This has adherent debris in it. I been using debridement and silver collagen to try and get this area to fill-in although I do not think we have made much progress this week 9/24; the patient's wound on the left medial ankle looks a lot better. The deeper divot area distally still requires debridement but this is cleaning up quite nicely we have been using silver collagen. The patient is complaining of swelling in her foot and is worried that that is contributing to the nonhealing of the ankle wound. She is also complaining of numbness in her anterior toes 10/4; left medial ankle. The small area distally still has a divot with necrotic material that I have been debriding away. This has an undermining area. She is approved for Apligraf. She saw Dr. [name] her surgeon on 10/1. I think he declared himself is satisfied with the condition of things. Still nonweightbearing till the end of the month. 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  Her surgical wound is well closed. There is no evidence of infection 10/11; the patient arrived in clinic today with the expectation that we be able to put an Apligraf on this area after debridement however she arrives with a relatively large amount of green drainage on the dressing. The patient states that this started on Friday. She has not been systemically unwell. 10/19; culture I did last week showed both Enterococcus and Pseudomonas. I think this came in separate parts because I stopped her ciprofloxacin I gave her and prescribed her linezolid however now looking at the final culture result this is Pseudomonas which is resistant to quinolones. She has not yet picked up the linezolid apparently phone issues. We are also trying to get a topical antibiotic out of  [place] in [place] they can be applied by home health. She is still having green drainage 10/16; the patient has her topical antibiotic from [facility] in [place]. This is a compounded gel with vancomycin and ciprofloxacin and gentamicin. We are applying this on the wound bed with silver alginate over the top with Unna boot wraps. She arrives in clinic today with a lot less ominous looking drainage although she is only use this topical preparation once the second time today. She sees Dr. [name] her surgeon on Friday she has home health changing the dressing 11/2; still using her compounded topical antibiotic under silver alginate. Surface is cleaning up there is less drainage. We had an Apligraf for her today and I elected to apply it. A light coating of her antibiotic 04/25/2020 upon evaluation today patient appears to be doing well with regard to her ankle ulcer. There is a little bit of slough noted on the surface of the wound I am can have to perform sharp debridement to clear this away today. With that being said other than that fact overall I feel like she is making progress and we do see some new epithelial growth. There is also some improvement in the depth of the wound and that distal portion. There is little bit of slough there as well. 12/7; 2-week follow-up. Apligraf #3. Dimensions are smaller. Closing in especially inferiorly. Still some surface debris. Still using the [facility] topical antibiotic but I told her that I don't think this needs to be renewed 12/21; 2-week follow-up. Apligraf #4 dimensions are smaller. Nice improvement 06/05/2020; 2-week follow-up. The patient's wound on the left medial ankle looks really excellent. Nice granulation. Advancing epithelialization no undermining no evidence of infection. We would have to reapply for another Apligraf but with the condition of this wound I did not feel strongly about it. We used Hydrofera Blue under the same degree  of compression. She follows up with Dr. [name] her surgeon a week Friday 06/13/2020 upon evaluation today patient appears to be doing excellent in regard to her wound. She has been tolerating the dressing changes without complication. Fortunately there is no signs of active infection at this time. No fevers, chills, nausea, vomiting, or diarrhea. She was using Hydrofera Blue last week. 06/20/2020 06/20/2020 on evaluation today patient actually appears to be doing excellent in regard to her wound. This is measuring better and looking much better as well. She has been using the collagen that seems to be doing better for her as well even though the [facility] was and is not sticking or feeling as rough on her wound. She did see Dr. [name] on Friday he is very pleased he also stated none of the hardware has shifted.  That is great news 1/27; the patient has a small clean wound all that is remaining. I agree that this is too small to really consider an Apligraf. Under illumination the surface is looking quite good. We have been using collagen although the dimensions are not any better this week 2/2; the patient has a small clean wound on the left medial ankle. Although this left of her substantial original areas. Measurements are smaller. We have been using polymen Ag under an Haematologist. 2/10; small area on the left medial ankle. This looks clean nothing to debride however dimensions are about the same we have been using polymen I think now for 2 weeks 2/17; not much change in surface area. We have been using polymen Ag without any improvement. 3/17; 1 month follow-up. The patient has been using endoform without any improvement in fact I think this looks worse with more depth and more expansion 3/24; no improvement. Perhaps less debris on the surface. We have been using Sorbact for 1 week 4/4; wound measures larger. She has edema in her leg and her foot which she tells as her wrap came down. We have been  using Unna boots. Sorbact of the wound. She has been approved for Apligraf 09/12/2020 upon evaluation today patient appears to be doing well with regard to her wound. We did get the Apligraf reapproved which is great news we have that available for application today. Fortunately there is no signs of infection and overall the patient seems to be doing great. The wound bed is nice and clean. 4/27; patient presents for her second application of Apligraf. She states over the past week she has been on her feet more often due to being outside in her garden. She has noted more swelling to her foot as a result. She denies increased warmth, pain or erythema to the wound site. 10/10/2020 upon evaluation today patient appears to be doing well with regard to her wound which does not appear to be quite as irritated as last week from what I am hearing. With that being said unfortunately she is having issues with some erythema and warmth to touch as well as an increase size. I do believe this likely is infected. 10/17/2020 upon evaluation today patient appears to be doing excellent in regard to her wound this is significantly improved as compared to last week. Fortunately I think that the infection is much better controlled at this point. She did have evidence of both Enterococcus as well as Staphylococcus noted on culture. Enterococcus really would not be helped significantly by the Cipro but the wound is doing so much better I am under the assumption that the Staphylococcus is probably the main organism that is causing the current infection. Nonetheless I think that she is doing excellent as far as that is concerned and I am very pleased in that regard. I would therefore recommend she continue with the Cipro. 10/31/2020 upon evaluation today patient appears to be doing well with regard to her wound. She has been tolerating the dressing changes without complication. Fortunately there is no signs of active infection and  overall I am extremely pleased with where things stand today. No fevers, chills, nausea, vomiting, or diarrhea. With that being said she does have some green drainage coming from the wound and although it looks okay I am a little concerned about the possibility of a continuing infection. Specifically with Pseudomonas. For that reason I will go ahead and send in a prescription for Cipro for her  to be continued. 11/14/2020 upon evaluation today patient appears to be doing very well currently in regard to her wound on her leg. She has been tolerating the dressing changes without complication. Fortunately I feel like the infection is finally under good control here. Unfortunately we do not have the Apligraf for application today although we can definitely order to have it in place for next week. That will be her fifth and final of the current series. Nonetheless I feel like her wound is really doing quite well which is great news. 11/21/2020 upon evaluation today patient appears to be doing well with regard to her wound on the medial ankle. Fortunately I think the infection is under control and I do believe we can go ahead and reapply the Apligraf today. She is in agreement with that plan. There does not appear to be any signs of active infection at this time which is great news. No fevers, chills, nausea, vomiting, or diarrhea. 12/05/2020 upon evaluation today patient's wound bed actually showed signs of good granulation epithelization at this point. There does not appear to be any signs of infection which is great news and overall very pleased with where things stand. Overall the patient seems to be doing fairly well in my opinion with regard to her wound although I do believe she continues to build up a lot of biofilm I think she could benefit from using PuraPly at this point. 12/12/2020 upon evaluation today patient's wound actually appears to be doing decently well today. The Unna boot has not been quite as  well-tolerated so that more uncomfortable for her and even causing some pressure over the plate on the lateral portion of her foot which is 90 where the wound is. There did not appear to be any significant deep tissue injury with that there may be a minimal change in the skin noted I think that we may want to go back to the Coflex 2 layer which is a little bit easier on her skin it seems. 12/19/2020 upon evaluation today patient actually seems to be making great progress with the PuraPly currently. She in fact seems to be much better as far as the overall appearance of the wound bed is concerned I am very happy in this regard. I do not see any signs of of infection which is great news as well. No fevers, chills, nausea, vomiting, or diarrhea. 12/26/2020 upon evaluation today patient appears to actually be doing better in regard to her wound on the left medial ankle region. The surface of the wound is actually doing significantly better which is great news. There does not appear to be any signs of infection which is also great news and in general I am extremely pleased with where we stand today. 01/02/2021 upon evaluation today patient appears to be doing well with regard to her wound. In fact this is showing signs of excellent improvement and very pleased with where things stand. In fact the last 3 appointments have all shown signs of this getting smaller which is excellent news. I have not even had to perform any debridement and today is no exception. Overall I feel like this is dramatically improved compared to previous. T oday is PuraPly application #4. 08/29/5186 upon evaluation today patient appears to be doing excellent in regard to her wound this is continue to show signs of improvement and overall I am extremely pleased with where we stand today. She is actually here for PuraPly application #5. Every time we have applied  this she is noted definite improvement on measurements. 01/23/2021 upon  evaluation today patient is actually making good progress in regard to her wound. This was actually on just a little bit longer this time compared to previous due to the fact that she did have to go out of town. She is actually here for PuraPly application #6. We have definitely been seeing improvements in the overall quality of the tissue on the surface of the wound which is awesome news. In general I think that the patient seems to be continuing to make great progress here. 01/30/2021 upon evaluation today patient's wound is actually doing excellent. There is really not any significant biofilm buildup which is great news and overall I am extremely pleased with where things stand today. There does not appear to be any signs of active infection. No fevers, chills, nausea, vomiting, or diarrhea. 02/06/2021 upon evaluation today patient's wound is actually showing signs of excellent improvement which is great news. We continue to see the benefit of the PuraPly this is doing a great job the wound seems not really irritated whatsoever and is showing signs of good granulation at this point. Overall I am extremely happy with what we are seeing. The patient likewise is happy to hear all of this as well. 02/13/2021 upon evaluation today patient appears to be doing well with regard to her wound. This again is measuring smaller today and I am very pleased in this regard. Fortunately there does not appear to be any signs of active infection at this time which is good news from a systemic standpoint. Locally there is still some significant drainage which she does have is concerned about infection locally. No fevers, chills, nausea, vomiting, or diarrhea. 02/20/2021 upon evaluation today patient actually appears to be doing decently well in regard to her wound. She has been tolerating the dressing changes without complication. I do believe the PuraPly is helping wound bed does appear to be doing better. There is no  evidence of active infection locally or systemically at this point visually although on fluorescence imaging there still appear to be bacterial load and bioburden noted. 02/27/2021 upon evaluation today patient fortunately continues to show signs of improvement with use of the PuraPly currently. Subsequently we did review her culture results and to be honest I think that the Bactrim is probably the best option to have her continue at this point. For that reason I am get a go ahead and send in a refill today for her for an additional 2 weeks. Nonetheless I think that we are making excellent progress here. It was Enterococcus and Staphylococcus that were noted she is allergic to penicillin so there is not much I can do from the Enterococcus standpoint though the staff does seem to be sensitive to the Bactrim. 03/06/2021 upon evaluation today patient appears to be doing well with regard to her wound. This is definitely showing signs of improvement which is great news and overall I am extremely pleased with where things stand today. There does not appear to be any signs of active infection which is great news and overall and I do believe that we are headed in the appropriate direction I think the PuraPly is doing an awesome job for her. 03/20/2021 upon evaluation today patient actually appears to be doing well with regard to her wound. We have been using the PuraPly although last week when I was out of the office they actually just used endoform nonetheless this still seems to be doing  great. Fortunately there does not appear to be any signs of active infection which is great news overall I think the patient is making wonderful progress. 03/27/2021 upon evaluation today patient appears to be doing well with regard to her wound in fact there is a little drainage but otherwise her does not appear to be any major signs of open wounds nor infection at this time. Overall I am extremely pleased with where things  stand. No fevers, chills, nausea, vomiting, or diarrhea. 04/03/2021 upon evaluation today patient presents for reevaluation here in the clinic and overall she seems to be doing quite well. Fortunately there does not appear to be any signs of active infection which is great news and in general I am very pleased with how things appear. There is still a very small opening remaining but in general she is looking much better. Fortunately I do not think there is any need for the PuraPly at this point. 04/10/2021 upon evaluation today patient's wound is actually showing signs of excellent improvement. I am actually very pleased with where it stands today. I think she is very close to complete closure in fact this may be healed today. Either way I think that she may benefit from using her own compression socks for the next week and then will subsequently see how things stand following. 04/17/2021 upon evaluation today patient appears to be doing well with regard to her wound on the ankle region. This is actually showing signs of excellent improvement which is great news. I do not see any signs of active infection at this time which is great and no open wound. With that being said on the lateral portion of her ankle right at the base of her plate she actually has an area of pressure that occurred over the past week when she was not in the wrap or we been padding her. This does seem to be an issue which I think is good to be an ongoing problem I am can actually get in touch with Dr. [name] today to see what we can do in that regard. 04/24/2021 upon evaluation today patient appears to be doing well with regard to her ankle region for the most part although there does appear to be some fluid trapped underneath the area where we thought this was healed last week. Unfortunately I think at this time that we can have to reopen and clean away some of this area and then subsequently depending on how things do over the  next week or so we will see about discharge but right now I do not think that is the right way to go. She voiced understanding. 05/01/2021 upon evaluation today patient's wound still appears to be open unfortunately. I do believe that we do need to see what we can do about trying to get this closed I believe compression wrapping is probably can to be indicated here. Fortunately there is no signs of active infection at this time. 05/08/2021 upon evaluation today patient appears to be doing okay in regard to her ulcer this is very superficial and we will see any major issues here. The biggest problem currently that I find is that we just cannot get this area to completely seal up. She seemed to almost be and then things digressed a little bit. Right now is not too bad but we do need to see if we can make some difference here. I think maybe switching over to collagen may be better. 05/15/2021 upon evaluation today patient  actually appears to be doing pretty well in regard to her wound. This is measuring a little smaller and looking much better. She did see orthopedics today there to me making her a insert for her shoe to try to help straighten out her gait. I think this can be beneficial for her as well. 05/22/2021 upon evaluation today patient appears to be doing well currently in regard to her wound although is not significantly smaller unfortunately this at least does not show any signs of active infection at this time which is great news. No fevers, chills, nausea, vomiting, or diarrhea. I am going to see about switching over to endoform that is what we wanted to use in the past always been on backorder. 06/05/2021 upon evaluation today patient appears to be doing somewhat poorly in regard to her wound. This is really not showing the signs of improvement that I would have like to have seen. Fortunately I do not see any signs of active infection locally nor systemically which is great news. With that  being said even though I do not see the sign she is having some increased discomfort which is not good. She is also having some issues here with the wound looking a little larger today compared to where its been. With that being said overall I believe that we probably need to consider at least that there may be a subacute infection here and that is what is going on. We have tried multiple different medications and dressings which have done well for her in the past without seeing any signs of improvement. She is in agreement with addressing this as such. Electronic Signature(s) Signed: 06/05/2021 1:32:16 PM By: [name] PA-C Entered By: [name] on 06/05/2021 13:32:16 -------------------------------------------------------------------------------- Physical Exam Details Patient Name: Date of Service: [facility] [name]. 06/05/2021 10:00 A M Medical Record Number: [id number] Patient Account Number: [id number] Date of Birth/Sex: Treating RN: [birth year] (66 y.o. [name] Primary Care Provider: [name] Other Clinician: Referring Provider: Treating Provider/Extender: [name] in Treatment: 58 Constitutional Well-nourished and well-hydrated in no acute distress. Respiratory normal breathing without difficulty. Psychiatric this patient is able to make decisions and demonstrates good insight into disease process. Alert and Oriented x 3. pleasant and cooperative. Notes Upon inspection patient's wound bed did require some sharp debridement. It was also a little bit deeper postdebridement compared to where its been. All in all I did obtain a wound culture from the deep wound area once this was debrided away she tolerated that without complication or pain. Electronic Signature(s) Signed: 06/05/2021 1:32:32 PM By: [name] PA-C Entered By: [name] on 06/05/2021  13:32:32 -------------------------------------------------------------------------------- Physician Orders Details Patient Name: Date of Service: [name]. 06/05/2021 10:00 [place] Record Number: [id number] Patient Account Number: [id number] Date of Birth/Sex: Treating RN: 1955-12-04 (67 y.o. 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 Margarita Grizzle Return A Other: - Prescription for Cipro sent to pharmacy, take as directed. Edema Control - Lymphedema / SCD /  Other Bilateral Lower Extremities Patient to wear own compression stockings every day. Exercise regularly Moisturize legs daily. Non Wound Condition pply the following to affected area as directed: - Apply silicone bordered foam to left lateral foot for protection A Wound Treatment Wound #15R - Malleolus Wound Laterality: Left, Medial Cleanser: Wound Cleanser (Generic) 1 x Per Day/15 Days Discharge Instructions: Cleanse the wound with wound cleanser prior to applying a clean dressing using gauze sponges, not tissue or cotton balls. Peri-Wound Care: Sween Lotion (Moisturizing lotion) 1 x Per Day/15 Days Discharge Instructions: Apply moisturizing lotion as directed Prim Dressing: Hydrofera Blue Ready Foam, 2.5 x2.5 in 1 x Per Day/15 Days ary Discharge Instructions: Apply to wound bed as instructed Secondary Dressing: Woven Gauze Sponge, Non-Sterile 4x4 in (Generic) 1 x Per Day/15 Days Discharge  Instructions: Apply over primary dressing as directed. Compression Wrap: CoFlex TLC XL 2-layer Compression System 4x7 (in/yd) 1 x Per Day/15 Days Discharge Instructions: Apply CoFlex 2-layer compression as directed. (alt for 4 layer) Laboratory erobe culture (MICRO) - Left Leg Wound - (ICD10 L97.828 - Non-pressure chronic ulcer of other Bacteria identified in Unspecified specimen by A part of left lower leg with other specified severity) LOINC Code: 381-8 Convenience Name: Areobic culture-specimen not specified Patient Medications llergies: penicillin, doxycycline A Notifications Medication Indication Start End 06/05/2021 Cipro DOSE 1 - oral 500 mg tablet - 1 tablet oral taken 2 times per day for 14 days Electronic Signature(s) Signed: 06/05/2021 1:34:21 PM By: [name] PA-C Previous Signature: 06/05/2021 10:48:20 AM Version By: [name] Entered By: [name] on 06/05/2021 13:34:19 -------------------------------------------------------------------------------- Problem List Details Patient Name: Date of Service: [name]. 06/05/2021 10:00 A M Medical Record Number: [id number] Patient Account Number: [id number] Date of Birth/Sex: Treating RN: [birth year] (66 y.o. 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 06/05/2021 10:00 A M Medical Record Number: [id number] Patient Account Number: [id number] Date of Birth/Sex: Treating RN: 17-Feb-1956 (66 y.o. [name] Primary Care Provider: [name] Other Clinician: Referring Provider: Treating Provider/Extender: [name] in Treatment: 46 Subjective Chief Complaint Information obtained from Patient patient is been followed long-term in this clinic for venous insufficiency ulcers with inflammation, hypertension and ulceration over the medial ankle bilaterally. 01/17/2020; this is a patient who is here for review of postoperative wounds on the left lateral ankle and recurrence of venous stasis ulceration on the left medial History of Present Illness (HPI) the remaining wound is over the left medial ankle. Similar wound over the right medial ankle healed largely with use of Apligraf. Most recently we have been using Hydrofera Blue over this wound with considerable improvement. The  patient has been extensively worked up in the past for her venous insufficiency and she is not a candidate for antireflux surgery although I have none of the details available currently. 08/24/14; considerable improvement today. About 50% of this wound areas now epithelialized. The base of the wound appears to be healthier granulation.as opposed to last week when she had deteriorated a considerable improvement 08/17/14; unfortunately the wound has regressed somewhat. The areas of epithelialization from the  superior aspect are not nearly as healthy as they were last week. The patient thinks her Hydrofera Blue slipped. 09/07/14; unfortunately the area has markedly regressed in the 2 weeks since I've seen this. There is an odor surrounding erythema. The healthy granulation tissue that we had at the base of the wound now is a dusky color. The nurse reports green drainage 09/14/14; the area looks somewhat better than last week. There is less erythema and less drainage. The culture I did did not show any growth. Nevertheless I think it is better to continue the Cipro and doxycycline for a further week. The remaining wound area was debridement. 09/21/14. Wound did not require debridement last week. Still less erythema and less drainage. She can complete her antibiotics. The areas of epithelialization in the superior aspect of the wound do not look as healthy as they did some weeks ago 10/05/14 continued improvement in the condition of this wound. There is advancing epithelialization. Less aggressive debridement required 10/19/14 continued improvement in the condition and volume of this wound. Less aggressive debridement to the inferior part of this to remove surface slough and fibrinous eschar 11/02/14 no debridement is required. The surface granulation appears healthy although some of her islands of epithelialization seem to have regressed. No evidence of infection 11/16/14; lites surface debridement done of surface eschar. The wound does not look to be unhealthy. No evidence of infection. Unfortunately the patient has had podiatry issues in the right foot and for some reason has redeveloped small surface ulcerations in the medial right ankle. Her original presentation involved wounds in this area 11/23/14 no debridement. The area on the right ankle has enlarged. The left ankle wound appears stable in terms of the surface although there is periwound inflammation. There has been regression in the amount of new  skin 11/30/14 no debridement. Both wound areas appear healthy. There was no evidence of infection. The the new area on the right medial ankle has enlarged although that both the surfaces appear to be stable. 12/07/14; Debridement of the right medial ankle wound. No no debridement was done on the left. 12/14/14 no major change in and now bilateral medial ankle wounds. Both of these are very painful but the no overt evidence of infection. She has had previous venous ablation 12/21/14; patient states that her right medial ankle wound is considerably more painful last week than usual. Her left is also somewhat painful. She could not tolerate debridement. The right medial ankle wound has fibrinous surface eschar 12/28/14 this is a patient with severe bilateral venous insufficiency ulcers. For a considerable period of time we actually had the one on the right medial ankle healed however this recently opened up again in June. The left medial ankle wound has been a refractory area with some absent flows. We had some success with Hydrofera Blue on this area and it literally closed by 50% however it is recently opened up Foley. Both of these were debridement today of surface eschar. She tolerates this poorly 01/25/15: No change in the status of this.  Thick adherent escar. Very poor tolerance of any attempt at debridement. I had healed the right medial malleolus wound for a considerable amount of time and had the left one down to about 50% of the volume although this is totally regressed over the last 48 weeks. Further the right leg has reopened. she is trying to make a appointment with pain and vascular, previous ablations with Dr. Aleda Grana. I do not believe there is an arterial insufficiency issue here 02/01/15 the status of the adherent eschar bilaterally is actually improved. No debridement was done. She did not manage to get vascular studies done 02/08/15 continued debridement of the area was done today. The  slough is less adherent and comes off with less pressure. There is no surrounding infection peripheral pulses are intact 02/15/15 selective debridement with a disposable curette. Again the slough is less adherent and comes off with less difficulty. No surrounding infection peripheral pulses are intact. 02/22/15 selective debridement of the right medial ankle wound. Slough comes off with less difficulty. No obvious surrounding infection peripheral pulses are intact I did not debridement the one on the left. Both of these are stable to improved 03/01/15 selective debridement of both wound areas using a curette to. Adherent slough cup soft with less difficulty. No obvious surrounding infection. The patient tells me that 2 days ago she noted a rash above the right leg wrap. She did not have this on her lower legs when she change this over she arrives with widespread left greater than right almost folliculitis-looking rash which is extremely pruritic. I don't see anything to culture here. There is no rash on the rest of her body. She feels well systemically. 03/08/15; selective debridement of both wounds using a curette. Base of this does not look unhealthy. She had limegreen drainage coming out of the left leg wound and describes a lot of drainage. The rash on her left leg looks improved to. No cultures were done. 03/22/15; patient was not here last week. Basal wounds does not look healthy and there is no surrounding erythema. No drainage. There is still a rash on the left leg that almost looks vasculitic however it is clearly limited to the top of where the wrap would be. 04/05/15; on the right required a surgical debridement of surface eschar and necrotic subcutaneous tissue. I did not debridement the area on the left. These continue to be large open wounds that are not changing that much. We were successful at one point in healing the area on the right, and at the same time the area on the left was roughly  half the size of current measurements. I think a lot of the deterioration has to do with the prolonged time the patient is on her feet at work 04/19/15 I attempted-like surface debridement bilaterally she does not tolerate this. She tells me that she was in allergic care yesterday with extreme pain over her left lateral malleolus/ankle and was told that she has an "sprain" 05/03/15; large bilateral venous insufficiency wounds over the medial malleolus/medial aspect of her ankles. She complains of copious amounts of drainage and his usual large amounts of pain. There is some increasing erythema around the wound on the right extending into the medial aspect of her foot to. historically she came in with these wounds the right one healed and the left one came down to roughly half its current size however the right one is reopened and the left is expanded. 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 05/10/15 large bilateral venous insufficiency wounds. There is less adherence surface left however the surface culture that I did last week grew pseudomonas therefore bilateral selective debridement score necessary. There is surrounding erythema. The patient describes severe bilateral drainage and a lot of pain in the left ankle. Apparently her podiatrist was were ready to do a cortisone shot 05/17/15; the patient complains of pain and again copious amounts of drainage. 05/24/15; we used Iodo flex last week. Patient notes considerable improvement in wound drainage. Only needed to change this once. 05/31/15; we continued Iodoflex; the base of these large wounds bilaterally is not too bad but there is probably likely a significant bioburden here. I would like to debridement just doesn't tolerate it. 06/06/14 I would like to continue the Iodoflex although she still hasn't managed to obtain supplies. She has bilateral medial malleoli or large wounds which are mostly  superficial. Both of them are covered circumferentially with some nonviable fibrinous slough although she tolerates debridement very poorly. She apparently has an appointment for an ablation on the right leg by interventional radiology. 06/14/15; the patient arrives with the wounds and static condition. We attempted a debridement although she does not do well with this secondary to pain. I 07/05/15; wounds are not much smaller however there appears to be a cleaner granulating base. The left has tight fibrinous slough greater than the right. Debridement is tolerated poorly due to pain. Iodoflex is done more for these wounds in any of the multitude of different dressings I have tried on the left 1 and then subsequently the right. 07/12/15; no change in the condition of this wound. I am able to do an aggressive debridement on the right but not the left. She simply cannot tolerate it. We have been using Iodoflex which helps somewhat. It is worthwhile remembering that at one point we healed the right medial ankle wound and the left was about 25% of the current circumference. We have suggested returning to vascular surgery for review of possible further ablations for one reason or another she has not been able to do this. 07/26/15 no major change in the condition of either wound on her medial ankle. I did not attempt to debridement of these. She has been aggressively scrubbing these while she is in the shower at home. She has her supply of Iodoflex which seems to have done more for these wounds then anything I have put on recently. 08/09/15 wound area appears larger although not verified by measurements. Using Iodoflex 09/05/2015 -- she was here for avisit today but had significant problems with the wound and I was asked to see her for a physician opinion. I have summarize that this lady has had surgery on her left lower extremity about 10 years ago where the possible veins stripping was done. She has had  an opinion from interventional radiology around November 2016 where no further sclerotherapy was ordered. The patient works 12 hours a day and stands on a concrete floor with work boots and is unable to get the proper compression she requires and cannot elevate her limbs appropriately at any given time. She has recently grown Pseudomonas from her wound culture but has not started her ciprofloxacin which was called in for her. 09/13/15 this continues to be a difficult situation for this patient. At one point I had this wound down to a 1.5 x 1.5" wound on her left leg. This is deteriorated and  the right leg has reopened. She now has substantial wounds on her medial calcaneus, malleoli and into her lower leg. One on the left has surface eschar but these are far too painful for me to debridement here. She has a vascular surgery appointment next week to see if anything can be done to help here. I think she has had previous ablations several years ago at [facility]. She has no major edema. She tells me that she did not get product last time [facility]] and went for several days without it. She continues to work in work boots 12 hours a day. She cannot get compression/4-layer under her work boots. 09/20/15 no major change. Periwound edema control was not very good. Her point with pain and vascular is next Wednesday the 25th 09/28/15; the patient is seen vascular surgery and is apparently scheduled for repeat duplex ultrasounds of her bilateral lower legs next week. 10/05/15; the patient was seen by Dr. Doren Custard of vascular surgery. He feels that she should have arterial insufficiency excluded as cause/contributed to her nonhealing stage she is therefore booked for an arteriogram. She has apparently monophasic signals in the dorsalis pedis pulses. She also of course has known severe chronic venous insufficiency with previous procedures as noted previously. I had another long discussion with the patient today  about her continuing to work 12 hour shifts. I've written her out for 2 months area had concerns about this as her work location is currently undergoing significant turmoil and this may lead to her termination. She is aware of this however I agree with her that she simply cannot continue to stand for 12 hours multiple days a week with the substantial wound areas she has. 10/19/15; the Dr. Doren Custard appointment was largely for an arteriogram which was normal. She does not have an arterial issue. He didn't make a comment about her chronic venous insufficiency for which she has had previous ablations. Presumably it was not felt that anything additional could be done. The patient is now out of work as I prescribed 2 weeks ago. Her wounds look somewhat less aggravated presumably because of this. I felt I would give debridement another try today 10/25/15; no major change in this patient's wounds. We are struggling to get her product that she can afford into her own home through her insurance. 11/01/15; no major change in the patient's wounds. I have been using silver alginate as the most affordable product. I spoke to Dr. Marla Roe last week with her requested take her to the OR for surgical debridement and placement of ACEL. Dr. Marla Roe told me that she would be willing to do this however [facility] will not cover this, fortunately the patient has [place] healthcare of some variant 11/08/15; no major change in the patient's wounds. She has been completely nonviable surface that this but is in too much pain with any attempted debridement are clinic. I have arranged for her to see Dr. Marla Roe ham of plastic surgery and this appointment is on Monday. I am hopeful that they will take her to the OR for debridement, possible ACEL ultimately possible skin graft 11/22/15 no major change in the patient's wounds over her bilateral medial calcaneus medial malleolus into the lower legs. Surface on these does not  look too bad however on the left there is surrounding erythema and tenderness. This may be cellulitis or could him sleepy tinea. 11/29/15; no major changes in the patient's wounds over her bilateral medial malleolus. There is no infection  here and I don't think any additional antibiotics are necessary. There is now plan to move forward. She sees Dr. Marla Roe in a week's time for preparation for operative debridement and ACEL placement I believe on 7/12. She then has a follow-up appointment with Dr. Marla Roe on 7/21 12/28/15; the patient returns today having been taken to the [facility] by Dr. Marla Roe 12/12/15 she underwent debridement, intraoperative cultures [which were negative]. She had placement of a wound VAC. Parent really ACEL was not available to be placed. The wound VAC foam apparently adhered to the wound since then she's been using silver alginate, Xeroform under Ace wraps. She still says there is a lot of drainage and a lot of pain 01/31/16; this is a patient I see monthly. I had referred her to Dr. Marla Roe him of plastic surgery for large wounds on her bilateral medial ankles. She has been to the OR twice once in early July and once in early August. She tells me over the last 3 weeks she has been using the wound VAC with ACEL underneath it. On the right we've simply been using silver alginate. Under Kerlix Coban wraps. 02/28/16; this is a patient I'm currently seeing monthly. She is gone on to have a skin graft over her large venous insufficiency ulcer on the left medial ankle. This was done by Dr. Marla Roe him. The patient is a bit perturbed about why she didn't have one on her right medial ankle wound. She has been using silver alginate to this. 03/06/16; I received a phone call from her plastic surgery Dr. Marla Roe. She expressed some concern about the viability of the skin graft she did on the left medial ankle wound. Asked me to place Endoform on this. She told me she is not planning  to do a subsequent skin graft on the right as the left one did not take very well. I had placed Hydrofera Blue on the right 03/13/16; continue to have a reasonably healthy wound on the right medial ankle. Down to 3 mm in terms of size. There is epithelialization here. The area on the left medial ankle is her skin graft site. I suppose the last week this looks somewhat better. She has an open area inferiorly however in the center there appears to be some viable tissue. There is a lot of surface callus and eschar that will eventually need to come off however none of this looked to be infected. Patient states that the is able to keep the dressing on for several days which is an improvement. 03/20/16 no major change in the circumference of either wound however on the left side the patient was at Dr. Eusebio Friendly office and they did a debridement of left wound. 50% of the wound seems to be epithelialized. I been using Endoform on the left Hydrofera Blue in the right 03/27/16; she arrives today with her wound is not looking as healthy as they did last week. The area on the right clearly has an adherent surface to this a very similar surface on the left. Unfortunately for this patient this is all too familiar problem. Clearly the Endoform is not working and will need to change that today that has some potential to help this surface. She does not tolerate debridement in this clinic very well. She is changing the dressing wants 04/03/16; patient arrives with the wounds looking somewhat better especially on the right. Dr. Migdalia Dk change the dressing to silver alginate when she saw her on Monday and also sold her some  compression socks. The usefulness of the latter is really not clear and woman with severely draining wounds. 04/10/16; the patient is doing a bit of an experiment wearing the compression stockings that Dr. Migdalia Dk provided her to her left leg and the out of legs based dressings that we provided to the  right. 05/01/16; the patient is continuing to wear compression stockings Dr. Migdalia Dk provided her on the left that are apparently silver impregnated. She has been using Iodoflex to the right leg wound. Still a moderate amount of drainage, when she leaves here the wraps only last for 4 days. She has to change the stocking on the left leg every night 05/15/16; she is now using compression stockings bilaterally provided by Dr. Marla Roe. She is wearing a nonadherent layer over the wounds so really I don't think there is anything specific being done to this now. She has some reduction on the left wound. The right is stable. I think all healing here is being done without a specific dressing 06/09/16; patient arrives here today with not much change in the wound certainly in diameter to large circular wounds over the medial aspect of her ankle bilaterally. Under the light of these services are certainly not viable for healing. There is no evidence of surrounding infection. She is wearing compression stockings with some sort of silver impregnation as prescribed by Dr. Marla Roe. She has a follow-up with her tomorrow. 06/30/16; no major change in the size or condition of her wounds. These are still probably covered with a nonviable surface. She is using only her purchase stockings. She did see Dr. Marla Roe who seemed to want to apply Dakin's solution to this I'm not extreme short what value this would be. I would suggest Iodoflex which she still has at home. 07/28/16; I follow Mrs. Royse episodically along with Dr. Marla Roe. 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 She has been applying a topical collagen ointment to both wounds with Adaptic. I don't think Dr. Marla Roe is planning to take her back to the OR. 08/19/16; I follow Mrs. Jeneen Rinks on a monthly basis along with Dr. Marla Roe of plastic surgery. She has very refractory venous  insufficiency wounds on the bilateral medial lower legs left greater than right. I been following her for a number of years. At one point I was able to get the right medial malleolus wound to heal and had the left medial malleolus down to about half its current size however and I had to send her to plastic surgery for an operative debridement. Since then things have been stable to slightly improve the area on the right is slightly better one in the left about the same although there is much less adherent surface than I'm used to with this patient. She is using some form of liquid collagen gel that Dr. Marla Roe provided a Kerlix cover with the patient's own pressure stockings. She tells me that she has extreme pain in both ankles and along the lateral aspect of both feet. She has been unable to work for some period of time. She is telling me she is retiring at the beginning of April. She sees Dr. Doran Durand of orthopedics next week 09/22/16; patient has not seen Dr. Marla Roe since the last time she is here. I'm not really sure what she is using to the wounds other than bits and pieces of think she had left over including most recently Hydrofera Blue. She is using juxtalite stockings. She is  having difficulty with her husband's recent illness "stroke". She is having to transport him to various doctors appointments. Dr. Marla Roe left her the option of a repeat debridement with ACEL however she has not been able to get the time to follow-up on this. She continues to have a fair amount of drainage out of these wounds with certainly precludes leaving dressings on all week 10/13/16; patient has not seen Dr. Marla Roe since she was last in our clinic. I'm not really sure what she is doing with the wounds, we did try to get her [facility] and I think she is actually using this most of the time. Because of drainage she states she has to change this every second day although this is an improvement from what  she used to do. She went to see Dr. Doran Durand who did not think she had a muscular issue with regards to her feet, he referred her to a neurologist and I think the appointment is sometime in June. I changed her back to Iodoflex which she has used in the past but not recently. 11/03/16; the patient has been using Iodoflex although she ran out of this. Still claims that there is a lot of drainage although the wound does not look like this. No surrounding erythema. She has not been back to see Dr. Marla Roe 11/24/16; the patient has been using Iodoflex again but she ran out of it 2 or 3 days ago. There is no major change in the condition of either one of these wounds in fact they are larger and covered in a thick adherent surface slough/nonviable tissue especially on the left. She does not tolerate mechanical debridement in our clinic. Going back to see Dr. Marla Roe of plastic surgery for an operative debridement would seem reasonable. 12/15/16; the patient has not been back to see Dr. Marla Roe. She is been dealing with a series of illnesses and her husband which of monopolized her time. She is been using Sorbact which we largely supplied. She states the drainage is bad enough that it maximum she can go 2-3 days without changing the dressing 01/12/2017 -- the patient has not been back for about 4 weeks and has not seen Dr. Marla Roe not does she have any appointment pending. 01/23/17; patient has not seen Dr. Marla Roe even though I suggested this previously. She is using Santyl that was suggested last week by Dr. Con Memos this Cost her $16 through her insurance which is indeed surprising 02/12/17; continuing Santyl and the patient is changing this daily. A lot of drainage. She has not been back to see plastic surgery she is using an Ace wrap. Our intake nurse suggested wrap around stockings which would make a good reasonable alternative 02/26/17; patient is been using Santyl and changing this daily due to  drainage. She has not been to see plastic surgery she uses in April Ace wrap to control the edema. She did obtain extremitease stockings but stated that the edema in her leg was to big for these 03/20/17; patient is using Santyl and Anasept. Surfaces looked better today the area on the right is actually measuring a little smaller. She has states she has a lot of pain in her feet and ankles and is asking for a consult to pain control which I'll try to help her with through our case manager. 04/10/17; the patient arrives with better-looking wound surfaces and is slightly smaller wound on the left she is using a combination of Santyl and Anasept. She has an appointment or  at least as started in the pain control center associated with [facility] 05/14/17; this is a patient who I followed for a prolonged period of time. She has venous insufficiency ulcers on her bilateral medial ankles. At one point I had this down to a much smaller wound on the left however these reopened and we've never been able to get these to heal. She has been using Santyl and Anasept gel although 2 weeks ago she ran out of the Anasept gel. She has a stable appearance of the wound. She is going to the wound care clinic at [facility]. They wanted do a nerve block/spinal block although she tells me she is reluctant to go forward with that. 05/21/17; this is a patient I have followed for many years. She has venous insufficiency ulcers on her bilateral medial ankles. Chronic pain and deformity in her ankles as well. She is been to see plastic surgery as well as orthopedics. Using PolyMem AG most recently/Kerramax/ABDs and 2 layer compression. She has managed to keep this on and she is coming in for a nurse check to change the dressing on Tuesdays, we see her on Fridays 06/05/17; really quite a good looking surface and the area especially on the right medial has contracted in terms of dimensions. Well granulated  healthy-looking tissue on both sides. Even with an open curet there is nothing that even feels abnormal here. This is as good as I've seen this in quite some time. We have been using PolyMem AG and bringing her in for a nurse check 06/12/17; really quite good surface on both of these wounds. The right medial has contracted a bit left is not. We've been using PolyMem and AG and she is coming in for a nurse visit 06/19/17; we have been using PolyMem AG and bringing her in for a nurse check. Dimensions of her wounds are not better but the surfaces looked better bilaterally. She complained of bleeding last night and the left wound and increasing pain bilaterally. She states her wound pain is more neuropathic than just the wounds. There was some suggestion that this was radicular from her pain management doctor in talking to her it is really difficult to sort this out. 06/26/17; using PolyMem and AG and bringing her in for a nurse check as All of this and reasonably stable condition. Certainly not improved. The dimensions on the lateral part of the right leg look better but not really measuring better. The medial aspect on the left is about the same. 07/03/16; we have been using PolyMen AG and bringing her in for a nurse check to change the dressings as the wounds have drainage which precludes once weekly changing. We are using all secondary absorptive dressings.our intake nurse is brought up the idea of using a wound VAC/snap VAC on the wound to help with the drainage to see if this would result in some contraction. This is not a bad idea. The area on the right medial is actually looking smaller. Both wounds have a reasonable-looking surface. There is no evidence of cellulitis. The edema is well controlled 07/10/17; the patient was denied for a snap VAC by her insurance. The major issue with these wounds continues to be drainage. We are using wicked PolyMem AG and she is coming in for a nurse visit to change  this. The wounds are stable to slightly improved. The surface looks vibrant and the area on the right certainly has shrunk in size but very slowly 07/17/17; the patient  still has large wounds on her bilateral medial malleoli. Surface of both of these wounds looks better. The dimensions seem to come and go but no consistent improvement. There is no epithelialization. We do not have options for advanced treatment products due to insurance issues. They did not approve of the wound VAC to help control the drainage. More recently we've been using PolyMem and AG wicked to allow drainage through. We have been bringing her in for a nurse visit to change this. We do not have a lot of options for wound care products and the home again due to insurance issues 07/24/17; the patient's wound actually looks somewhat better today. No drainage measurements are smaller still healthy-looking surface. We used silver collagen under PolyMen started last week. We have been bringing her in for a dressing change 07/31/17; patient's wound surface continued to look better and I think there is visible change in the dimensions of the wound on the right. Rims of epithelialization. We have been using silver collagen under PolyMen and bringing her in for a dressing change. There appears to be less drainage although she is still in need of the dressing change 08/07/17. Patient's wound surface continues to look better on both sides and the area on the right is definitely smaller. We have been using silver collagen and PolyMen. She feels that the drainage has been it has been better. I asked her about her vascular status. She went to see Dr. Aleda Grana at [facility] and had some form of ablation. I don't have much detail on this. I haven't my notes from 2016 that she was not a candidate for any further ablation but I don't have any more information on this. We had referred her to vein and vascular I don't think she ever went. He does not  have a history of PAD although I don't have any information on this either. We don't even have ABIs in our record 08/14/17; we've been using silver collagen and PolyMen cover. And putting the patient and compression. She we are bringing her in as a nurse visit to change this because ofarge amount of drainage. We didn't the ABIs in clinic today since they had been done in many moons 1.2 bilaterally. She has been to see vein and vascular however this was at [facility] and she had ablation although I really don't have any information on this all seemed biking get a report. She is also been operatively debrided by plastic surgery and had a cell placed probably 8-12 months ago. This didn't have a major effect. We've been making some gains with current dressings 08/19/17-She is here in follow-up evaluation for bilateral medial malleoli ulcers. She continues to tolerate debridement very poorly. We will continue with recently changed topical treatment; if no significant improvement may consider switching to Iodosorb/Iodoflex. 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 She has been using silver collagen and PolyMem. I believe she has been used and tried on Iodoflex before. 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 These have been very refractory over time. We have been using silver collagen and PolyMen. She is coming in once a week for a doctor's and once a week for nurse visits. We are actually making some progress 09/18/17; the patient's wounds are smaller especially on the  right medial. She arrives today to upset to consider even washing these off with Anasept which I think is been part of the reason this is been closing. We've been using collagen covered in PolyMen  otherwise. It is noted that she has a small area of folliculitis on the right medial calf that. As we are wrapping her legs I'll give her a short course of doxycycline to make sure this doesn't amount to anything. She is a long list of complaints today including imbalance, shortness of breath on exertion, inversion of her left ankle. With regards to the latter complaints she is been to see orthopedics and they offered her a tendon release surgery I believe but wanted her wounds to be closed first. I have recommended she go see her primary physician with regards to everything else. 09/25/17; patient's wounds are about the same size. We have made some progress bilaterally although not in recent weeks. She will not allow me T wash these wounds with Anasept even if she is doing her cell. Wheeze we've been using collagen covered in PolyMen. Last week she had a small area of folliculitis this is now opened into a small wound. She completed 5 days of trimethoprim sulfamethoxazole 10/02/17; unfortunately the area on her left medial ankle is worse with a larger wound area towards the Achilles. The patient complains of a lot of pain. She will not allow debridement although visually I don't think there is anything to debridement in any case. We have been using silver collagen and PolyMen for several months now. Initially we are making some progress although I'm not really seeing that today. We will move back to [facility]. His admittedly this is a bit of a repeat however I'm hoping that his situation is different now. The patient tells me she had her leg on the left give out on her yesterday this is process some pain. 10/09/17; the patient is seen twice a week largely because of drainage issues coming out of the chronic medial bimalleolar wounds that are chronic. Last week the dimensions of the one on the left looks a little larger I changed her to [facility]. She comes in today with a history of terrible  pain in the bilateral wound areas. She will not allow debridement. She will not even allow a tissue culture. There is no surrounding erythema no no evidence of cellulitis. We have been putting her Kerlix Coban man. She will not allow more aggressive compression as there was a suggestion to put her in 3 layer wraps. 10/16/17; large wounds on her bilateral medial malleoli. These are chronic. Not much change from last week. The surface looks have healthy but absolutely no epithelialization. A lot of pain little less so of drainage. She will not allow debridement or even washing these off in the vigorous fashion with Anasept. 10/23/17; large wounds on her bilateral malleoli which are chronic. Some improvement in terms of size perhaps on the right since last time I saw these. She states that after we increased the 3 layer compression there was some bleeding, when she came in for a nurse visit she did not want 3 layer compression put back on about our nurse managed to convince her. She has known chronic venous visit issues and I'm hoping to get her to tolerate the 3 layer compression. using Hydrofera Blue 10/30/17; absolutely no change in the condition of either wound although we've had some improvement in dimensions on the right.. Attempted to put her in 3 layer  compression she didn't tolerated she is back in 2 layer compression. We've been using Hydrofera Blue We looked over her past records. She had venous reflux studies in November 2016. There was no evidence of deep venous reflux on the right. Superficial vein did not show the greater saphenous vein at think this is been previously ablated the small saphenous vein was within normal limits. The left deep venous system showed no DVT the vessels were positive for deep venous reflux in the posterior tibial veins at the ankle. The greater saphenous vein was surgically absent small saphenous vein was within normal limits. She went to vein and vascular at  [place] [facility]. I believe she had an ablation on the left greater saphenous vein. I'll update her reflux studies perhaps ever reviewed by vein and vascular. We've made absolutely no progress in these wounds. Will also try to read and TheraSkins through her insurance 11/06/17; W the patient apparently has a 2 week follow-up with vein and vascular I like him to review the whole issue with regards to her previous vascular workup by Dr. Aleda Grana. We've really made no progress on these wounds in many months. She arrives today with less viable looking surface on the left medial ankle wound. This was apparently looking about the same on Tuesday when she was here for nurse visit. 11/13/17; deep tissue culture I did last time of the left lower leg showed multiple organisms without any predominating. In particular no Staphylococcus or group A strep were isolated. We sent her for venous reflux studies. She's had a previous left greater saphenous vein stripping and I think sclerotherapy of the right greater saphenous vein. She didn't really look at the lesser saphenous vein this both wounds are on the medial aspect. She has reflux in the common femoral vein and popliteal vein and an accessory vein on the right and the common femoral vein and popliteal vein on the left. I'm going to have her go to see vein and vascular just the look over things and see if anything else beside aggressive compression is indicated here. We have not been able to make any progress on these wounds in spite of the fact that the surface of the wounds is never look too bad. 11/20/17; no major change in the condition of the wounds. Patient reports a large amount of drainage. She has a lot of complaints of pain although enlisting her today I wonder if some of this at least his neuropathic rather than secondary to her wounds. She has an appointment with vein and vascular on 12/30/17. The refractory nature of these wounds in my mind at least  need vein and vascular to look over the wounds the recent reflux studies we did and her history to see if anything further can be done here. I also note her gait is deteriorated quite a bit. 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 She has a bilateral Trendelenburg gait. I wonder if this is neuropathic or perhaps multilevel radicular. 11/27/17; her wounds actually looks slightly better. Healthy-looking granulation tissue a scant amount of epithelialization. [place] [facility] will not pay for [employer]. They will play for tri layer Oasis and Dermagraft. This is not a diabetic ulcer. We'll try for the tri layer Oasis. She still complains of some drainage. She has a vein and vascular appointment on 12/30/17 12/04/17; the wounds visually look quite good. Healthy-looking granulation with some degree of epithelialization. We are still waiting for response to our request for trial to  try layer Oasis. Her appointment with vascular to review venous and arterial issues isn't sold the end of July 7/31. Not allow debridement or even vigorous cleansing of the wound surface. 12/18/17; slightly smaller especially on the right. 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 We've been using Hydrofera Blue. We still are looking into triple layer Oasis through her insurance 01/08/18 on evaluation today patient's wound actually appears to be showing signs of good improvement at this point in time. She has been tolerating the dressing changes without complication. Fortunately there does not appear to be any evidence of infection at this point in time. We have been utilizing silver nitrate which does seem to be of benefit for her which is also good news. Overall I'm very happy with how things seem to be both regards appearance as well as measurement. Patient did see Dr. Bridgett Larsson for evaluation on 12/30/17. In his assessment he felt that stripping would not likely add much more than chronic  compression to the patient's healing process. His recommendation was to follow-up in three months with Dr. Doren Custard if she hasn't healed in order to consider referral back to you and see vascular where she previously was in a trial and was able to get her wound to heal. I'll be see what she feels she when you staying compression and he reiterated this as well. 01/13/18 on evaluation today patient appears to actually be doing very well in regard to her bilateral medial malleolus ulcers. She seems to have tolerated the chemical cauterization with silver nitrate last week she did have some pain through that evening but fortunately states that I'll be see since it seems to be doing better she is overall pleased with the progress. 01/21/18; really quite a remarkable improvement since I've last seen these wounds. We started using silver nitrate specially on the islands of hyper granulation which for some reason her around the wound circumference. This is really done quite nicely. Primary dressing Hydrofera Blue under 4 layer compression. She seems to be able to hold out without a nurse rewrap. Follow-up in 1 week 01/28/18; we've continued the hydrofera blue but continued with chemical cauterization to the wound area that we started about a month ago for irregular hyper granulation. She is made almost stunning improvement in the overall wound dimensions. I was not really expecting this degree of improvement in these chronic wounds 02/05/18; we continue with Hydrofera Bluebut of also continued the aggressive chemical cauterization with silver nitrate. We made nice progress with the right greater than left wound. 02/12/18. We continued with Hydrofera Blue after aggressive chemical cauterization with silver nitrate. We appear to be making nice progress with both wound areas 02/19/2018; we continue with [facility] after washing the wounds vigorously with Anasept spray and chemical cauterization with silver nitrate.  We are making excellent progress. The area on the right's just about closed 02/26/2018. The area on the left medial ankle had too much necrotic debris today. I used a #5 curette we are able to get most of the soft. I continued with the silver nitrate to the much smaller wound on the right medial ankle she had a new area on her right lower pretibial area which she says was due to a role in her compression 03/05/2018; both wound areas look healthy. Not much change in dimensions from last week. I continue to use silver nitrate and Hydrofera Blue. The patient saw Dr. Doren Custard of vein and vascular. He felt she had venous stasis ulcers. He  felt based on her previous arteriogram she should have adequate circulation for healing. Also she has deep venous reflux but really no significant correctable superficial venous reflux at this time. He felt we should continue with conservative management including leg elevation and compression 04/02/2018; since we last saw this woman about a month ago she had a fall apparently suffered a pelvic fracture. I did not look up the x-ray. Nevertheless because of pain she literally was bedbound for 2 weeks and had home health coming out to change the dressing. Somewhat predictably this is resulted in considerable improvement in both wound areas. The right is just about closed on the medial malleolus and the left is about half the size. 04/16/2018; both her wounds continue to go down in size. Using Hydrofera Blue. 05/07/18; both her wounds appeared to be improving especially on the right where it is almost closed. We are using Hydrofera Blue 05/14/2018; slightly worse this week with larger wounds. Surface on the left medial not quite as good. We have been using Hydrofera Blue 05/21/18; again the wounds are slightly larger. Left medial malleolus slightly larger with eschar around the circumference. We have been using Hydrofera Blue undergoing a wraps for a prolonged period of time. This  got a lot better when she was more recumbent due to a fall and a back injury. I change the primary dressing the silver alginate today. She did not tolerate a 4 layer compression previously although I may need to bring this up with her next time 05/28/2018; area on the left medial malleolus again is slightly larger with more drainage. Area on the right is roughly unchanged. She has a small area of folliculitis on the right medial just on the lower calf. This does not look ominous. 06/03/2018 left medial malleolus slightly smaller in a better looking surface. We used silver nitrate on this last time with silver alginate. The area on the right appears slightly smaller 1/10; left medial malleolus slightly smaller. Small open area on the right. We used silver nitrate and silver alginate as of 2 weeks ago. We continue with the wound and compression. These got a lot better when she was off her feet 1/17; right medial malleolus wound is smaller. The left may be slightly smaller. Both surfaces look somewhat better. 1/24; both wounds are slightly smaller. Using silver alginate under Unna boots 1/31; both wounds appear smaller in fact the area on the right medial is just about closed. Surface eschar. We have been using silver alginate under Unna boots. The patient is less active now spends let much less time on her feet and I think this is contributed to the general improvement in the wound condition 2/7; both wounds appear smaller. I was hopeful the right medial would be closed however there there is still the same small open area. Slight amount of surface eschar on the left the dimensions are smaller there is eschar but the wound edges appear to be free. We have been using silver alginate under Unna boot's 2/14; both wounds once again measure smaller. Circumferential eschar on the left medial. We have been using silver alginate under Unna boots with gradual improvement 2/21; the area on the right medial  malleolus has healed. The area on the left is smaller. We have been using silver alginate and Unna boots. We can discharge wrapping the right leg she has 20/30 stockings at home she will need to protect the scar tissue in this area 2/28; the area on the right medial malleolus  remains closed the patient has a compression stocking. The area on the left is smaller. We have been using silver alginate and Unna boots. 3/6 the area on the right medial ankle remains closed. Good edema control noted she is using her own compression stocking. The area on the left medial ankle is smaller. We have been managing this with silver alginate and Unna boots which we will continue today. 3/13; the area on the right medial ankle remains closed and I'm declaring it healed today. When necessary the left is about the same still a healthy-looking surface but no major change and wound area. No evidence of infection and using silver alginate under unna and generally making considerable improvement 3/27 the area on the right medial ankle remains closed the area on the left is about the same as last week. Certainly not any worse we have been using silver alginate under an Unna boot 4/3; the area on the right medial ankle remains closed per the patient. We did not look at this wound. The wound on the left medial ankle is about the same surface looks healthy we have been using silver alginate under an Unna boot 4/10; area on the right medial ankle remains closed per the patient. We did not look at this wound. The wound on the left medial ankle is slightly larger. The patient complains that the [facility] caused burning pain all week. She also told us that she was a lot more active this week. Changed her back to silver alginate 4/17; right medial ankle still closed per the patient. Left medial ankle is slightly larger. Using silver alginate. She did not tolerate Hydrofera Blue on this area 4/24; right medial ankle remains  closed we have not look at this. The left medial ankle continues to get larger today by about a centimeter. We have been using silver alginate under Unna boots. She complains about 4 layer compression as an alternative. She has been up on her feet working on her garden 5/8; right medial ankle remains closed we did not look at this. The left medial ankle has increased in size about 100%. We have been using silver alginate under Unna boots. She noted increased pain this week and was not surprised that the wound is deteriorated 5/15; no major change in SA however much less erythema ( one week of doxy ocellulitis). 5/22-66 year old female returns at 1 week to the clinic for left medial ankle wound for which we have been using silver alginate under 3 layer compression She was placed on DOXY at last visit - the wound is wider at this visit. She is in 3 layer compression 5/29; change to [facility] last week. I had given her empiric doxycycline 2 weeks ago for a week. She is in 3 layer compression. She complains of a lot of pain and drainage on presentation today. 6/5; using Hydrofera Blue. I gave her doxycycline recently empirically for erythema and pain around the wound. Believe her cultures showed enterococcus which not would not have been well covered by doxycycline nevertheless the wound looks better and I don't feel specifically that the enterococcus needs to be covered. She has a new what looks like a wrap injury on her lateral left ankle. 6/12; she is using Hydrofera Blue. She has a new area on the left anterior lower tibial area. This was a wrap injury last week. 6/19; the patient is using Hydrofera Blue. She arrived with marked inflammation and erythema around the wound and tenderness. 12/01/18 on evaluation  today patient appears to be doing a little bit better based on what I'm hearing from the standpoint of lassos evaluation to this as far as the overall appearance of the wound is concerned.  Then sometime substandard she typically sees Dr. Dellia Nims. Nonetheless overall very pleased with the progress that she's made up to this point. No fevers, chills, nausea, or vomiting noted at this time. 7/10; some improvement in the surface area. Aggressively debrided last week apparently. I went ahead with the debridement today although the patient does not tolerate this very well. We have been using Iodoflex. Still a fair amount of drainage 7/17; slightly smaller. Using Iodoflex. 7/24; no change from last week in terms of surface area. We have been using Iodoflex. Surface looks and continues to look somewhat better 7/31; surface area slightly smaller better looking surface. We have been using Iodoflex. This is under Unna boot compression 8/7-Patient presents at 1 week with Unna boot and Iodoflex, wound appears better 8/14-Patient presents at 1 week with Iodoflex, we use the Unna boot, wound appears to be stable better.Patient is getting Botox treatment for the inversion of the foot for tendon release, Next week 8/21; we are using Iodoflex. Unna boot. The wound is stable in terms of surface area. Under illumination there is some areas of the wound that appear to be either epithelialized or perhaps this is adherent slough at this point I was not really clear. It did not wipe off and I was reluctant to debride this today. 8/28; we are using Iodoflex in an Unna boot. Seems to be making good improvement. 9/4; using Iodoflex and wound is slightly smaller. 9/18; we are using Iodoflex with topical silver nitrate when she is here. The wound continues to be smaller 10/2; patient missed her appointment last week due to GI issues. She left and Iodoflex based dressing on for 2 weeks. Wound is about the same size about the size of a dime on the left medial lower 10/9 we have been using Iodoflex on the medial left ankle wound. She has a new superficial probable wrap injury on the dorsal left ankle 10/16; we have  been using Hydrofera Blue since last week. This is on the left medial ankle 10/23; we have been using Hydrofera Blue since 2 weeks ago. This is on the left medial ankle. Dimensions are better 11/6; using Hydrofera Blue. I think the wound is smaller but still not closed. Left medial ankle 11/13; we have been using Hydrofera Blue. Wound is certainly no smaller this week. Also the surface not as good. This is the remanent of a very large area on her left medial ankle. 11/20; using Sorbact since last week. Wound was about the same in terms of size although I was disappointed about the surface debris 12/11; 3-week follow-up. Patient was on vacation. Wound is measuring slightly larger we have been using Sorbact. 12/18; wound is about the same size however surface looks better last week after debridement. We have been using Sorbact under compression 1/15 wound is probably twice the size of last time increased in length nonviable surface. We have been using Sorbact. She was running a mild fever and missed her appointment last week 1/22; the wound is come down in size but under illumination still a very adherent debris we have been Hydrofera Blue that I changed her to last week 1/29; dimensions down slightly. We have been using Hydrofera Blue 2/19 dimensions are the same however there is rims of epithelialization under illumination. Therefore more  the surface area may be epithelialized 2/26; the patient's wound actually measures smaller. The wound looks healthy. We have been using Hydrofera Blue. I had some thoughts about running Apligraf then I still may do that however this looks so much better this week we will delay that for now 3/5; the wound is small but about the same as last week. We have been using Hydrofera Blue. No debridement is required today. 3/19; the wound is about the size of a dime. Healthy looking wound even under illumination. We have been using Hydrofera Blue. No mechanical  debridement is necessary 3/26; not much change from last week although still looks very healthy. We have been using Hydrofera Blue under Unna boots Patient was offered an ankle fusion by podiatry but not until the wound heals with a proceed with this. 4/9; the patient comes in today with her original wound on the medial ankle looking satisfactory however she has some uncontrolled swelling in the middle part of her leg with 2 new open areas superiorly just lateral to the tibia. I think this was probably a wrap issue. She said she felt uncomfortable during the week but did not call in. We have been using Hydrofera Blue 4/16; the wound on the medial ankle is about the same. She has innumerable small areas superior to this across her mid tibia. I think this is probably folliculitis. She is also been working in the yard doing a lot of sweating 4/30; the patient issue on the upper areas across her mid tibia of all healed. I think this was excessive yard work if I remember. Her wound on the medial ankle is smaller. Some debris on this we have been using Hydrofera Blue under Unna boots 5/7; mid tibia. She has been using Hydrofera Blue under an Unna wrap. She is apparently going for her ankle surgery on June 3 10/28/19-Patient returns to clinic with the ankle wound, we are using Hydrofera Blue under Unna wrap, surgery is scheduled for her left foot for June 3 so she will be back for nurse visit next week READMISSION 01/17/2020 Mrs. Bok is a 66 year old woman we have had in this clinic for a long period of time with severe venous hypertension and refractory wounds on her medial lower legs and ankles bilaterally. 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 When she was off her legs for a prolonged period example when she fell and suffered a compression fracture things would heal up quite  nicely. She is now retired and we managed to heal up the right medial leg wound. The left one was very tiny last time I saw this although still refractory. 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 It got to the point where this was interfering with her walking and she elected to proceed with a ankle arthrodesis to straighten her her ankle and leave her with a functional outcome for mobilization. The patient was referred to Dr. Doren Custard and really this took some time to arrange. Dr. Doren Custard saw her on 12/07/2019. Once again he verified that she had no arterial issues. She had previously had an angiogram several years ago. Follow-up ABIs on the left showed an ABI of 1.12 with triphasic waveforms and a TBI of 0.92. She is felt to have chronic deep venous insufficiency  but I do not think it was felt that anything could be done from about this from an ablation point of view. At the time Dr. Doren Custard saw this patient the wounds actually look closed via the pictures in his clinic. The patient finally underwent her surgery on 12/15/2019. This went reasonably well and there was a good anatomic outcome. She developed a small distal wound dehiscence on the lateral part of the surgical wound. However more problematically she is developed recurrence of the wound on the medial left ankle. There are actually 2 wounds here one in the distal lower leg and 1 pretty much at the level of the medial malleolus. It is a more distal area that is more problematic. She has been using Hydrofera Blue which started on Friday before this she was simply Ace wrapping. There was a culture done that showed Pseudomonas and she is on ciprofloxacin. A recent CNS on 8/11 was negative. The patient reports some pain but I generally think this is improving. She is using a cam boot completely nonweightbearing using a walker for pivot transfers and a  wheelchair 8/24; not much improvement unfortunately she has a surgical wound on the lateral part in the venous insufficiency wound medially. The bottom part of the medial insufficiency wound is still necrotic there is exposed tendon here. We have been using Hydrofera Blue under compression. Her edema control is however better 8/31; patient in for follow-up of his surgical wound on the lateral part of her left leg and chronic venous insufficiency ulcers medially. We put her back in compression last week. She comes in today with a complaint of 3 or 4 days worth of increasing pain. She felt her cam walker was rubbing on the area on the back of her heel. However there is intense erythema seems more likely she has cellulitis. She had 2 cultures done when she was seeing podiatry in the postop. One of them in late July showed Pseudomonas and she received a course of ciprofloxacin the other was negative on 8/11 she is allergic to penicillin with anaphylactoid complaints of hives oral swelling via information in epic 9/9; when I saw this patient last week she had intense anterior erythema around her wound on the right lateral heel and ankle and also into the right medial heel. Some of this was no doubt drainage and her walker boot however I was convinced she had cellulitis. I gave her Levaquin and Bactrim she is finishing up on this now. She is following up with Dr. [name] he saw her yesterday. He is taken her out of the walking boot of course she is still nonweightbearing. Her x-ray was negative for any worrisome features such as soft tissue air etc. Things are a lot better this week. She has home health. We have been using Hydrofera Blue under an [employer] which she put back on yesterday. I did not wrap her last week 9/17; her surrounding skin looks a lot better. In fact the area on the left lateral ankle has just a scant amount of eschar. The only remaining wound is the large area on the left medial ankle.  Probably about 60% of this is healthy granulation at the surface however she has a significant divot distally. This has adherent debris in it. I been using debridement and silver collagen to try and get this area to fill-in although I do not think we have made much progress this week 9/24; the patient's wound on the left medial ankle looks a lot  better. The deeper divot area distally still requires debridement but this is cleaning up quite nicely we have been using silver collagen. The patient is complaining of swelling in her foot and is worried that that is contributing to the nonhealing of the ankle wound. She is also complaining of numbness in her anterior toes 10/4; left medial ankle. The small area distally still has a divot with necrotic material that I have been debriding away. This has an undermining area. She is approved for Apligraf. She saw Dr. [name] her surgeon on 10/1. I think he declared himself is satisfied with the condition of things. Still nonweightbearing till the end of the month. 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 Her surgical wound is well closed. There is no evidence of infection 10/11; the patient arrived in clinic today with the expectation that we be able to put an Apligraf on this area after debridement however she arrives with a relatively large amount of green drainage on the dressing. The patient states that this started on Friday. She has not been systemically unwell. 10/19; culture I did last week showed both Enterococcus and Pseudomonas. I think this came in separate parts because I stopped her ciprofloxacin I gave her and prescribed her linezolid however now looking at the final culture result this is Pseudomonas which is resistant to quinolones. She has not yet picked up the linezolid apparently phone issues. We are also trying to get a topical antibiotic out of [place] in [place] they can be applied by home health. She  is still having green drainage 10/16; the patient has her topical antibiotic from [facility] in [place]. This is a compounded gel with vancomycin and ciprofloxacin and gentamicin. We are applying this on the wound bed with silver alginate over the top with Unna boot wraps. She arrives in clinic today with a lot less ominous looking drainage although she is only use this topical preparation once the second time today. She sees Dr. [name] her surgeon on Friday she has home health changing the dressing 11/2; still using her compounded topical antibiotic under silver alginate. Surface is cleaning up there is less drainage. We had an Apligraf for her today and I elected to apply it. A light coating of her antibiotic 04/25/2020 upon evaluation today patient appears to be doing well with regard to her ankle ulcer. There is a little bit of slough noted on the surface of the wound I am can have to perform sharp debridement to clear this away today. With that being said other than that fact overall I feel like she is making progress and we do see some new epithelial growth. There is also some improvement in the depth of the wound and that distal portion. There is little bit of slough there as well. 12/7; 2-week follow-up. Apligraf #3. Dimensions are smaller. Closing in especially inferiorly. Still some surface debris. Still using the [facility] topical antibiotic but I told her that I don't think this needs to be renewed 12/21; 2-week follow-up. Apligraf #4 dimensions are smaller. Nice improvement 06/05/2020; 2-week follow-up. The patient's wound on the left medial ankle looks really excellent. Nice granulation. Advancing epithelialization no undermining no evidence of infection. We would have to reapply for another Apligraf but with the condition of this wound I did not feel strongly about it. We used Hydrofera Blue under the same degree of compression. She follows up with Dr. [name] her surgeon a week  Friday 06/13/2020 upon evaluation  today patient appears to be doing excellent in regard to her wound. She has been tolerating the dressing changes without complication. Fortunately there is no signs of active infection at this time. No fevers, chills, nausea, vomiting, or diarrhea. She was using Hydrofera Blue last week. 06/20/2020 06/20/2020 on evaluation today patient actually appears to be doing excellent in regard to her wound. This is measuring better and looking much better as well. She has been using the collagen that seems to be doing better for her as well even though the [facility] was and is not sticking or feeling as rough on her wound. She did see Dr. [name] on Friday he is very pleased he also stated none of the hardware has shifted. That is great news 1/27; the patient has a small clean wound all that is remaining. I agree that this is too small to really consider an Apligraf. Under illumination the surface is looking quite good. We have been using collagen although the dimensions are not any better this week 2/2; the patient has a small clean wound on the left medial ankle. Although this left of her substantial original areas. Measurements are smaller. We have been using polymen Ag under an Haematologist. 2/10; small area on the left medial ankle. This looks clean nothing to debride however dimensions are about the same we have been using polymen I think now for 2 weeks 2/17; not much change in surface area. We have been using polymen Ag without any improvement. 3/17; 1 month follow-up. The patient has been using endoform without any improvement in fact I think this looks worse with more depth and more expansion 3/24; no improvement. Perhaps less debris on the surface. We have been using Sorbact for 1 week 4/4; wound measures larger. She has edema in her leg and her foot which she tells as her wrap came down. We have been using Unna boots. Sorbact of the wound. She has been approved for  Apligraf 09/12/2020 upon evaluation today patient appears to be doing well with regard to her wound. We did get the Apligraf reapproved which is great news we have that available for application today. Fortunately there is no signs of infection and overall the patient seems to be doing great. The wound bed is nice and clean. 4/27; patient presents for her second application of Apligraf. She states over the past week she has been on her feet more often due to being outside in her garden. She has noted more swelling to her foot as a result. She denies increased warmth, pain or erythema to the wound site. 10/10/2020 upon evaluation today patient appears to be doing well with regard to her wound which does not appear to be quite as irritated as last week from what I am hearing. With that being said unfortunately she is having issues with some erythema and warmth to touch as well as an increase size. I do believe this likely is infected. 10/17/2020 upon evaluation today patient appears to be doing excellent in regard to her wound this is significantly improved as compared to last week. Fortunately I think that the infection is much better controlled at this point. She did have evidence of both Enterococcus as well as Staphylococcus noted on culture. Enterococcus really would not be helped significantly by the Cipro but the wound is doing so much better I am under the assumption that the Staphylococcus is probably the main organism that is causing the current infection. Nonetheless I think that she is  doing excellent as far as that is concerned and I am very pleased in that regard. I would therefore recommend she continue with the Cipro. 10/31/2020 upon evaluation today patient appears to be doing well with regard to her wound. She has been tolerating the dressing changes without complication. Fortunately there is no signs of active infection and overall I am extremely pleased with where things stand today. No  fevers, chills, nausea, vomiting, or diarrhea. With that being said she does have some green drainage coming from the wound and although it looks okay I am a little concerned about the possibility of a continuing infection. Specifically with Pseudomonas. For that reason I will go ahead and send in a prescription for Cipro for her to be continued. 11/14/2020 upon evaluation today patient appears to be doing very well currently in regard to her wound on her leg. She has been tolerating the dressing changes without complication. Fortunately I feel like the infection is finally under good control here. Unfortunately we do not have the Apligraf for application today although we can definitely order to have it in place for next week. That will be her fifth and final of the current series. Nonetheless I feel like her wound is really doing quite well which is great news. 11/21/2020 upon evaluation today patient appears to be doing well with regard to her wound on the medial ankle. Fortunately I think the infection is under control and I do believe we can go ahead and reapply the Apligraf today. She is in agreement with that plan. There does not appear to be any signs of active infection at this time which is great news. No fevers, chills, nausea, vomiting, or diarrhea. 12/05/2020 upon evaluation today patient's wound bed actually showed signs of good granulation epithelization at this point. There does not appear to be any signs of infection which is great news and overall very pleased with where things stand. Overall the patient seems to be doing fairly well in my opinion with regard to her wound although I do believe she continues to build up a lot of biofilm I think she could benefit from using PuraPly at this point. 12/12/2020 upon evaluation today patient's wound actually appears to be doing decently well today. The Unna boot has not been quite as well-tolerated so that more uncomfortable for her and even  causing some pressure over the plate on the lateral portion of her foot which is 90 where the wound is. There did not appear to be any significant deep tissue injury with that there may be a minimal change in the skin noted I think that we may want to go back to the Coflex 2 layer which is a little bit easier on her skin it seems. 12/19/2020 upon evaluation today patient actually seems to be making great progress with the PuraPly currently. She in fact seems to be much better as far as the overall appearance of the wound bed is concerned I am very happy in this regard. I do not see any signs of of infection which is great news as well. No fevers, chills, nausea, vomiting, or diarrhea. 12/26/2020 upon evaluation today patient appears to actually be doing better in regard to her wound on the left medial ankle region. The surface of the wound is actually doing significantly better which is great news. There does not appear to be any signs of infection which is also great news and in general I am extremely pleased with where we stand today. 01/02/2021  upon evaluation today patient appears to be doing well with regard to her wound. In fact this is showing signs of excellent improvement and very pleased with where things stand. In fact the last 3 appointments have all shown signs of this getting smaller which is excellent news. I have not even had to perform any debridement and today is no exception. Overall I feel like this is dramatically improved compared to previous. T oday is PuraPly application #4. 5/39/7673 upon evaluation today patient appears to be doing excellent in regard to her wound this is continue to show signs of improvement and overall I am extremely pleased with where we stand today. She is actually here for PuraPly application #5. Every time we have applied this she is noted definite improvement on measurements. 01/23/2021 upon evaluation today patient is actually making good progress in  regard to her wound. This was actually on just a little bit longer this time compared to previous due to the fact that she did have to go out of town. She is actually here for PuraPly application #6. We have definitely been seeing improvements in the overall quality of the tissue on the surface of the wound which is awesome news. In general I think that the patient seems to be continuing to make great progress here. 01/30/2021 upon evaluation today patient's wound is actually doing excellent. There is really not any significant biofilm buildup which is great news and overall I am extremely pleased with where things stand today. There does not appear to be any signs of active infection. No fevers, chills, nausea, vomiting, or diarrhea. 02/06/2021 upon evaluation today patient's wound is actually showing signs of excellent improvement which is great news. We continue to see the benefit of the PuraPly this is doing a great job the wound seems not really irritated whatsoever and is showing signs of good granulation at this point. Overall I am extremely happy with what we are seeing. The patient likewise is happy to hear all of this as well. 02/13/2021 upon evaluation today patient appears to be doing well with regard to her wound. This again is measuring smaller today and I am very pleased in this regard. Fortunately there does not appear to be any signs of active infection at this time which is good news from a systemic standpoint. Locally there is still some significant drainage which she does have is concerned about infection locally. No fevers, chills, nausea, vomiting, or diarrhea. 02/20/2021 upon evaluation today patient actually appears to be doing decently well in regard to her wound. She has been tolerating the dressing changes without complication. I do believe the PuraPly is helping wound bed does appear to be doing better. There is no evidence of active infection locally or systemically at this  point visually although on fluorescence imaging there still appear to be bacterial load and bioburden noted. 02/27/2021 upon evaluation today patient fortunately continues to show signs of improvement with use of the PuraPly currently. Subsequently we did review her culture results and to be honest I think that the Bactrim is probably the best option to have her continue at this point. For that reason I am get a go ahead and send in a refill today for her for an additional 2 weeks. Nonetheless I think that we are making excellent progress here. It was Enterococcus and Staphylococcus that were noted she is allergic to penicillin so there is not much I can do from the Enterococcus standpoint though the staff does seem to  be sensitive to the Bactrim. 03/06/2021 upon evaluation today patient appears to be doing well with regard to her wound. This is definitely showing signs of improvement which is great news and overall I am extremely pleased with where things stand today. There does not appear to be any signs of active infection which is great news and overall and I do believe that we are headed in the appropriate direction I think the PuraPly is doing an awesome job for her. 03/20/2021 upon evaluation today patient actually appears to be doing well with regard to her wound. We have been using the PuraPly although last week when I was out of the office they actually just used endoform nonetheless this still seems to be doing great. Fortunately there does not appear to be any signs of active infection which is great news overall I think the patient is making wonderful progress. 03/27/2021 upon evaluation today patient appears to be doing well with regard to her wound in fact there is a little drainage but otherwise her does not appear to be any major signs of open wounds nor infection at this time. Overall I am extremely pleased with where things stand. No fevers, chills, nausea, vomiting,  or diarrhea. 04/03/2021 upon evaluation today patient presents for reevaluation here in the clinic and overall she seems to be doing quite well. Fortunately there does not appear to be any signs of active infection which is great news and in general I am very pleased with how things appear. There is still a very small opening remaining but in general she is looking much better. Fortunately I do not think there is any need for the PuraPly at this point. 04/10/2021 upon evaluation today patient's wound is actually showing signs of excellent improvement. I am actually very pleased with where it stands today. I think she is very close to complete closure in fact this may be healed today. Either way I think that she may benefit from using her own compression socks for the next week and then will subsequently see how things stand following. 04/17/2021 upon evaluation today patient appears to be doing well with regard to her wound on the ankle region. This is actually showing signs of excellent improvement which is great news. I do not see any signs of active infection at this time which is great and no open wound. With that being said on the lateral portion of her ankle right at the base of her plate she actually has an area of pressure that occurred over the past week when she was not in the wrap or we been padding her. This does seem to be an issue which I think is good to be an ongoing problem I am can actually get in touch with Dr. [name] today to see what we can do in that regard. 04/24/2021 upon evaluation today patient appears to be doing well with regard to her ankle region for the most part although there does appear to be some fluid trapped underneath the area where we thought this was healed last week. Unfortunately I think at this time that we can have to reopen and clean away some of this area and then subsequently depending on how things do over the next week or so we will see about discharge but  right now I do not think that is the right way to go. She voiced understanding. 05/01/2021 upon evaluation today patient's wound still appears to be open unfortunately. I do believe that we do need  to see what we can do about trying to get this closed I believe compression wrapping is probably can to be indicated here. Fortunately there is no signs of active infection at this time. 05/08/2021 upon evaluation today patient appears to be doing okay in regard to her ulcer this is very superficial and we will see any major issues here. The biggest problem currently that I find is that we just cannot get this area to completely seal up. She seemed to almost be and then things digressed a little bit. Right now is not too bad but we do need to see if we can make some difference here. I think maybe switching over to collagen may be better. 05/15/2021 upon evaluation today patient actually appears to be doing pretty well in regard to her wound. This is measuring a little smaller and looking much better. She did see orthopedics today there to me making her a insert for her shoe to try to help straighten out her gait. I think this can be beneficial for her as well. 05/22/2021 upon evaluation today patient appears to be doing well currently in regard to her wound although is not significantly smaller unfortunately this at least does not show any signs of active infection at this time which is great news. No fevers, chills, nausea, vomiting, or diarrhea. I am going to see about switching over to endoform that is what we wanted to use in the past always been on backorder. 06/05/2021 upon evaluation today patient appears to be doing somewhat poorly in regard to her wound. This is really not showing the signs of improvement that I would have like to have seen. Fortunately I do not see any signs of active infection locally nor systemically which is great news. With that being said even though I do not see the sign she is  having some increased discomfort which is not good. She is also having some issues here with the wound looking a little larger today compared to where its been. With that being said overall I believe that we probably need to consider at least that there may be a subacute infection here and that is what is going on. We have tried multiple different medications and dressings which have done well for her in the past without seeing any signs of improvement. She is in agreement with addressing this as such. Objective Constitutional Well-nourished and well-hydrated in no acute distress. Vitals Time Taken: 10:26 AM, Height: 68 in, Temperature: 98.5 F, Pulse: 57 bpm, Respiratory Rate: 18 breaths/min, Blood Pressure: 123/70 mmHg. Respiratory normal breathing without difficulty. Psychiatric this patient is able to make decisions and demonstrates good insight into disease process. Alert and Oriented x 3. pleasant and cooperative. General Notes: Upon inspection patient's wound bed did require some sharp debridement. It was also a little bit deeper postdebridement compared to where its been. All in all I did obtain a wound culture from the deep wound area once this was debrided away she tolerated that without complication or pain. Integumentary (Hair, Skin) Wound #15R status is Open. Original cause of wound was Gradually Appeared. The date acquired was: 12/30/2019. The wound has been in treatment 72 weeks. The wound is located on the Left,Medial Malleolus. The wound measures 1.8cm length x 1.2cm width x 0.1cm depth; 1.696cm^2 area and 0.17cm^3 volume. There is Fat Layer (Subcutaneous Tissue) exposed. There is no tunneling or undermining noted. There is a medium amount of serous drainage noted. The wound margin is distinct with the outline  attached to the wound base. There is large (67-100%) red, pink granulation within the wound bed. There is a small (1- 33%) amount of necrotic tissue within the wound bed  including Adherent Slough. 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 There was a Excisional Skin/Subcutaneous Tissue Debridement with a total area of 2.16 sq cm performed by [name], PA. With the following instrument(s): Curette to remove Non-Viable tissue/material. Material removed includes Subcutaneous Tissue and Slough and after achieving pain control using Lidocaine 5% topical ointment. 1 specimen was taken by a Swab and sent to the lab per facility protocol. A time out was conducted at 10:54, prior to the start of the procedure. A Minimum amount of bleeding was controlled with Pressure. The procedure was tolerated well. Post Debridement Measurements: 1.8cm length x 1.2cm width x 0.1cm depth; 0.17cm^3 volume. Character of Wound/Ulcer Post Debridement is stable. Severity of Tissue Post Debridement is: Fat layer exposed. Post procedure Diagnosis Wound #15R: Same as Pre-Procedure Pre-procedure diagnosis of Wound #15R is a Venous Leg Ulcer located on the Left,Medial Malleolus . There was a Double Layer Compression Therapy Procedure by [name], RN. Post procedure Diagnosis Wound #15R: Same as Pre-Procedure Plan Follow-up Appointments: Return Appointment in 1 week. Margarita Grizzle Other: - Prescription for Cipro sent to pharmacy, take as directed. Edema Control - Lymphedema / SCD / Other: Patient to wear own compression stockings every day. Exercise regularly Moisturize legs daily. Non Wound Condition: Apply the following to affected area as directed: - Apply silicone  bordered foam to left lateral foot for protection Laboratory ordered were: Areobic culture-specimen not specified - Left Leg Wound The following medication(s) was prescribed: Cipro oral 500 mg tablet 1 1 tablet oral taken 2 times per day for 14 days starting 06/05/2021 WOUND #15R: - Malleolus Wound Laterality: Left, Medial Cleanser: Wound Cleanser (Generic) 1 x Per Day/15 Days Discharge Instructions: Cleanse the wound with wound cleanser prior to applying a clean dressing using gauze sponges, not tissue or cotton balls. Peri-Wound Care: Sween Lotion (Moisturizing lotion) 1 x Per Day/15 Days Discharge Instructions: Apply moisturizing lotion as directed Prim Dressing: Hydrofera Blue Ready Foam, 2.5 x2.5 in 1 x Per Day/15 Days ary Discharge Instructions: Apply to wound bed as instructed Secondary Dressing: Woven Gauze Sponge, Non-Sterile 4x4 in (Generic) 1 x Per Day/15 Days Discharge Instructions: Apply over primary dressing as directed. Com pression Wrap: CoFlex TLC XL 2-layer Compression System 4x7 (in/yd) 1 x Per Day/15 Days Discharge Instructions: Apply CoFlex 2-layer compression as directed. (alt for 4 layer) 1. I would recommend currently that we go ahead and on top of the doing the wound culture sent in a prescription as well for Cipro which she has done well with in the past. The patient is in agreement with that plan. 2. I am also can recommend at this time that we continue with the 4-layer compression wrap or rather the 2 layer alternative that is doing well for her. She does seem to be tolerating this quite well. 3. I would recommend Hydrofera Blue as the dressing of choice currently. She is in agreement with that plan as well. Hopefully will start seeing  some of this turnaround with the addition of the antibiotic therapy. We will see patient back for reevaluation in 1 week here in the clinic. If anything worsens or changes patient will contact our office for  additional recommendations. Electronic Signature(s) Signed: 06/05/2021 1:34:40 PM By: [name] PA-C Entered By: [name] on 06/05/2021 13:34:40 -------------------------------------------------------------------------------- SuperBill Details Patient Name: Date of Service: [name], [name] [name]. 06/05/2021 Medical Record Number: [id number] Patient Account Number: [id number] Date of Birth/Sex: Treating RN: 1955/08/14 (66 y.o. [name] Primary Care Provider: [name] Other Clinician: Referring Provider: Treating Provider/Extender: [name] in Treatment: 72 Diagnosis Coding ICD-10 Codes Code Description 6576003639 Chronic venous hypertension (idiopathic) with ulcer and inflammation of left lower extremity L97.828 Non-pressure chronic ulcer of other part of left lower leg with other specified severity L97.328 Non-pressure chronic ulcer of left ankle with other specified severity Facility Procedures CPT4 Code: 29528413 Description: 24401 - DEB SUBQ TISSUE 20 SQ CM/< ICD-10 Diagnosis Description L97.828 Non-pressure chronic ulcer of other part of left lower leg with other specified Modifier: severity Quantity: 1 Physician Procedures : CPT4 Code Description Modifier [id number] 99214 - WC PHYS LEVEL 4 - EST PT 25 ICD-10 Diagnosis Description I87.332 Chronic venous hypertension (idiopathic) with ulcer and inflammation of left lower extremity L97.828 Non-pressure chronic ulcer of other  part of left lower leg with other specified severity L97.328 Non-pressure chronic ulcer of left ankle with other specified severity Quantity: 1 : [id number] 11042 - WC PHYS SUBQ TISS 20 SQ CM ICD-10 Diagnosis Description L97.828 Non-pressure chronic ulcer of other part of left lower leg with other specified severity Quantity: 1 Electronic Signature(s) Signed: 06/05/2021 1:35:41 PM By: [name] PA-C Entered By: [name] on 06/05/2021 13:35:40

## 2021-06-11 LAB — AEROBIC/ANAEROBIC CULTURE W GRAM STAIN (SURGICAL/DEEP WOUND): Gram Stain: NONE SEEN

## 2021-06-12 ENCOUNTER — Encounter (HOSPITAL_BASED_OUTPATIENT_CLINIC_OR_DEPARTMENT_OTHER): Payer: Medicare Other | Admitting: Physician Assistant

## 2021-06-12 ENCOUNTER — Other Ambulatory Visit: Payer: Self-pay

## 2021-06-12 DIAGNOSIS — L97828 Non-pressure chronic ulcer of other part of left lower leg with other specified severity: Secondary | ICD-10-CM | POA: Diagnosis not present

## 2021-06-12 NOTE — Progress Notes (Signed)
[name], [name] ([id number]) Visit Report for 06/12/2021 Arrival Information Details Patient Name: Date of Service: [facility] [name] 06/12/2021 10:00 A M Medical Record Number: [id number] Patient Account Number: [id number] Date of Birth/Sex: Treating RN: [birth year] (66 y.o. [name] Windy Dudek: [name] Other Clinician: Referring Morrissa Shein: Treating Salle Brandle/Extender: [name] in Treatment: 44 Visit Information History Since Last Visit Added or deleted any medications: No Patient Arrived: Cane Any new allergies or adverse reactions: No Arrival Time: 10:07 Had a fall or experienced change in No Accompanied By: self activities of daily living that may affect Transfer Assistance: None risk of falls: Patient Identification Verified: Yes Signs or symptoms of abuse/neglect since last visito No Secondary Verification Process Completed: Yes Hospitalized since last visit: No Patient Requires Transmission-Based Precautions: No Implantable device outside of the clinic excluding No Patient Has Alerts: Yes cellular tissue based products placed in the center Patient Alerts: L ABI =1.12, TBI = .92 since last visit: R ABI= 1.02, TBI= .58 Has Dressing in Place as Prescribed: Yes Has Compression in Place as Prescribed: Yes Pain Present Now: Yes Electronic Signature(s) Signed: 06/12/2021 5:54:22 PM By: Baruch Gouty RN, BSN Entered By: Baruch Gouty on 06/12/2021 10:12:29 -------------------------------------------------------------------------------- Compression Therapy Details Patient Name: Date of Service: [name] Brandy [name]. 06/12/2021 10:00 A M Medical Record Number: [id number] Patient Account Number: [id number] Date of Birth/Sex: Treating RN: 24-Dec-1955 (66 y.o. [name] Marycatherine Maniscalco: [name] Other Clinician: Referring Ford Peddie: Treating Dyami Umbach/Extender: [name] in  Treatment: 58 Compression Therapy Performed for Wound Assessment: Wound #15R Left,Medial Malleolus Performed By: Clinician Baruch Gouty, RN Compression Type: Double Layer Post Procedure Diagnosis Same as Pre-procedure Electronic Signature(s) Signed: 06/12/2021 5:54:22 PM By: Baruch Gouty RN, BSN Entered By: Baruch Gouty on 06/12/2021 10:41:13 -------------------------------------------------------------------------------- Encounter Discharge Information Details Patient Name: Date of Service: [name] [name]. 06/12/2021 10:00 [place] Record Number: [id number] Patient Account Number: [id number] Date of Birth/Sex: Treating RN: 01/25/1956 (66 y.o. [name] Joshuwa Vecchio: [name] Other Clinician: Referring Gill Delrossi: Treating Valkyrie Guardiola/Extender: [name] in Treatment: 21 Encounter Discharge Information Items Discharge Condition: Stable Ambulatory Status: Cane Discharge Destination: Home Transportation: Private Auto Accompanied By: self Schedule Follow-up Appointment: Yes Clinical Summary of Care: Patient Declined Electronic Signature(s) Signed: 06/12/2021 5:54:22 PM By: Baruch Gouty RN, BSN Entered By: Baruch Gouty on 06/12/2021 11:00:37 -------------------------------------------------------------------------------- Lower Extremity Assessment Details Patient Name: Date of Service: [facility] [name]. 06/12/2021 10:00 [place] Record Number: [id number] Patient Account Number: [id number] Date of Birth/Sex: Treating RN: [birth year] (66 y.o. [name] Laron Boorman: [name] Other Clinician: Referring Sachi Boulay: Treating Sophiana Milanese/Extender: [name] in Treatment: 68 Edema Assessment Assessed: [Left: No] [Right: No] Edema: [Left: N] [Right: o] Calf Left: Right: Point of Measurement: 31 cm From Medial Instep 27 cm Ankle Left: Right: Point of Measurement:  11 cm From Medial Instep 20.5 cm Vascular Assessment Pulses: Dorsalis Pedis Palpable: [Left:Yes] Electronic Signature(s) Signed: 06/12/2021 5:54:22 PM By: Baruch Gouty RN, BSN Entered By: Baruch Gouty on 06/12/2021 10:22:13 -------------------------------------------------------------------------------- [place] Details Patient Name: Date of Service: [name] Brandy [name]. 06/12/2021 10:00 A M Medical Record Number: [id number] Patient Account Number: [id number] Date of Birth/Sex: Treating RN: November 01, 1955 (66 y.o. [name] Caasi Giglia: [name] Other Clinician: Referring Fadi Menter: Treating Kieren Adkison/Extender: Clementeen Graham,  Noelle Weeks in Treatment: 35 Multidisciplinary Care Plan reviewed with physician Active Inactive Venous Leg Ulcer Nursing Diagnoses: Actual venous Insuffiency (use after diagnosis is confirmed) Knowledge deficit related to disease process and management Goals: Patient will maintain optimal edema control Date Initiated: 06/20/2020 Target Resolution Date: 07/10/2021 Goal Status: Active Interventions: Assess peripheral edema status every visit. Compression as ordered Treatment Activities: Therapeutic compression applied : 06/20/2020 Notes: 01/30/21: Edema control ongoing, using CoFlex wrap. 06/05/21: Edema control continues Wound/Skin Impairment Nursing Diagnoses: Impaired tissue integrity Knowledge deficit related to ulceration/compromised skin integrity Goals: Patient/caregiver will verbalize understanding of skin care regimen Date Initiated: 01/17/2020 Target Resolution Date: 07/10/2021 Goal Status: Active Ulcer/skin breakdown will have a volume reduction of 30% by week 4 Date Initiated: 01/17/2020 Date Inactivated: 03/12/2020 Target Resolution Date: 03/09/2020 Goal Status: Met Interventions: Assess patient/caregiver ability to obtain necessary supplies Assess patient/caregiver ability to perform  ulcer/skin care regimen upon admission and as needed Assess ulceration(s) every visit Provide education on ulcer and skin care Notes: 01/30/21: Wound care regimen continues, PuraPly skin sub being used. 04/17/21: Wound healed 06/05/21: Wound re-opened, wound care regimen continues. Electronic Signature(s) Signed: 06/12/2021 5:54:22 PM By: Baruch Gouty RN, BSN Entered By: Baruch Gouty on 06/12/2021 10:30:10 -------------------------------------------------------------------------------- Pain Assessment Details Patient Name: Date of Service: [name] [name]. 06/12/2021 10:00 A M Medical Record Number: [id number] Patient Account Number: [id number] Date of Birth/Sex: Treating RN: Nov 24, 1955 (66 y.o. [name] Prithvi Kooi: [name] Other Clinician: Referring Ulysee Fyock: Treating Harmony Sandell/Extender: [name] in Treatment: 52 Active Problems Location of Pain Severity and Description of Pain Patient Has Paino Yes Site Locations Pain Location: Generalized Pain With Dressing Change: Yes Rate the pain. Current Pain Level: 2 Least Pain Level: 0 Character of Pain Describe the Pain: Other: numbness, tingling Pain Management and Medication Current Pain Management: Is the Current Pain Management Adequate: Adequate How does your wound impact your activities of daily livingo Sleep: No Bathing: No Appetite: No Relationship With Others: No Bladder Continence: No Emotions: No Bowel Continence: No Work: No Toileting: No Drive: No Dressing: No Hobbies: No Electronic Signature(s) Signed: 06/12/2021 5:54:22 PM By: Baruch Gouty RN, BSN Entered By: Baruch Gouty on 06/12/2021 10:16:16 -------------------------------------------------------------------------------- Patient/Caregiver Education Details Patient Name: Date of Service: [name] Brandy [name] 1/11/2023andnbsp10:00 A M Medical Record Number: [id number] Patient Account  Number: [id number] Date of Birth/Gender: Treating RN: 1955/12/23 (66 y.o. [name] Physician: [name] Other Clinician: Referring Physician: Treating Physician/Extender: [name] in Treatment: 73 Education Assessment Education Provided To: Patient Education Topics Provided Venous: Methods: Explain/Verbal Responses: Reinforcements needed, State content correctly Wound/Skin Impairment: Methods: Explain/Verbal Responses: Reinforcements needed, State content correctly Electronic Signature(s) Signed: 06/12/2021 5:54:22 PM By: Baruch Gouty RN, BSN Entered By: Baruch Gouty on 06/12/2021 10:30:54 -------------------------------------------------------------------------------- Wound Assessment Details Patient Name: Date of Service: [name] [name]. 06/12/2021 10:00 A M Medical Record Number: [id number] Patient Account Number: [id number] Date of Birth/Sex: Treating RN: 1955/11/06 (66 y.o. [name] Art Levan: [name] Other Clinician: Referring Breindy Meadow: Treating Sparrow Sanzo/Extender: [name] in Treatment: 42 Wound Status Wound Number: 15R Primary Venous Leg Ulcer Etiology: Wound Location: Left, Medial Malleolus Wound Status: Open Wounding Event: Gradually Appeared Comorbid Peripheral Venous Disease, Osteoarthritis, Confinement Date Acquired: 12/30/2019 History: Anxiety Weeks Of Treatment: 73 Clustered Wound: No Photos Wound Measurements Length: (cm) 1.8 Width: (cm) 1 Depth: (cm) 0.1 Area: (cm) 1.414 Volume: (  cm) 0.141 % Reduction in Area: 87.9% % Reduction in Volume: 98.5% Epithelialization: Small (1-33%) Tunneling: No Undermining: No Wound Description Classification: Full Thickness With Exposed Support Structures Wound Margin: Distinct, outline attached Exudate Amount: Medium Exudate Type: Serosanguineous Exudate Color: red, brown Foul Odor After  Cleansing: No Slough/Fibrino Yes Wound Bed Granulation Amount: Large (67-100%) Exposed Structure Granulation Quality: Pink Fascia Exposed: No Necrotic Amount: Small (1-33%) Fat Layer (Subcutaneous Tissue) Exposed: Yes Necrotic Quality: Adherent Slough Tendon Exposed: No Muscle Exposed: No Joint Exposed: No Bone Exposed: No Treatment Notes Wound #15R (Malleolus) Wound Laterality: Left, Medial Cleanser Wound Cleanser Discharge Instruction: Cleanse the wound with wound cleanser prior to applying a clean dressing using gauze sponges, not tissue or cotton balls. Peri-Wound Care Sween Lotion (Moisturizing lotion) Discharge Instruction: Apply moisturizing lotion as directed Topical Primary Dressing Hydrofera Blue Ready Foam, 2.5 x2.5 in Discharge Instruction: Apply to wound bed as instructed Secondary Dressing Woven Gauze Sponge, Non-Sterile 4x4 in Discharge Instruction: Apply over primary dressing as directed. Secured With Compression Wrap CoFlex TLC XL 2-layer Compression System 4x7 (in/yd) Discharge Instruction: Apply CoFlex 2-layer compression as directed. (alt for 4 layer) Compression Stockings Add-Ons Electronic Signature(s) Signed: 06/12/2021 5:54:22 PM By: Baruch Gouty RN, BSN Entered By: Baruch Gouty on 06/12/2021 10:26:56 -------------------------------------------------------------------------------- Vitals Details Patient Name: Date of Service: [name] [name]. 06/12/2021 10:00 [place] Record Number: [id number] Patient Account Number: [id number] Date of Birth/Sex: Treating RN: 05-11-1956 (66 y.o. [name] Deloros Beretta: [name] Other Clinician: Referring Millette Halberstam: Treating Ellanore Vanhook/Extender: [name] in Treatment: 22 Vital Signs Time Taken: 10:13 Temperature (F): 97.9 Height (in): 68 Pulse (bpm): 55 Source: Stated Respiratory Rate (breaths/min): 18 Blood Pressure (mmHg): 109/52 Reference  Range: 80 - 120 mg / dl Electronic Signature(s) Signed: 06/12/2021 5:54:22 PM By: Baruch Gouty RN, BSN Entered By: Baruch Gouty on 06/12/2021 10:14:23

## 2021-06-12 NOTE — Progress Notes (Addendum)
HARUKO, MERSCH ([id number]) Visit Report for 06/12/2021 Chief Complaint Document Details Patient Name: Date of Service: [facility] [name], [name] 06/12/2021 10:00 A M Medical Record Number: [id number] Patient Account Number: [id number] Date of Birth/Sex: Treating RN: 20-Feb-1956 (66 y.o. [name] Provider: [name] Other Clinician: Referring Provider: Treating Provider/Extender: [name] in Treatment: 85 Information Obtained from: Patient Chief Complaint patient is been followed long-term in this clinic for venous insufficiency ulcers with inflammation, hypertension and ulceration over the medial ankle bilaterally. 01/17/2020; this is a patient who is here for review of postoperative wounds on the left lateral ankle and recurrence of venous stasis ulceration on the left medial Electronic Signature(s) Signed: 06/12/2021 10:32:40 AM By: [name] PA-C Entered By: [name] on 06/12/2021 10:32:40 -------------------------------------------------------------------------------- HPI Details Patient Name: Date of Service: [name], [name] 06/12/2021 10:00 A M Medical Record Number: [id number] Patient Account Number: [id number] Date of Birth/Sex: Treating RN: [birth year] (66 y.o. [name] Provider: [name] Other Clinician: Referring Provider: Treating Provider/Extender: [name] in Treatment: 50 History of Present Illness HPI Description: the remaining wound is over the left medial ankle. Similar wound over the right medial ankle healed largely with use of Apligraf. Most recently we have been using Hydrofera Blue over this wound with considerable improvement. The patient has been extensively worked up in the past for her venous insufficiency and she is not a candidate for antireflux surgery although I have none of the details available currently. 08/24/14; considerable  improvement today. About 50% of this wound areas now epithelialized. The base of the wound appears to be healthier granulation.as opposed to last week when she had deteriorated a considerable improvement 08/17/14; unfortunately the wound has regressed somewhat. The areas of epithelialization from the superior aspect are not nearly as healthy as they were last week. The patient thinks her Hydrofera Blue slipped. 09/07/14; unfortunately the area has markedly regressed in the 2 weeks since I've seen this. There is an odor surrounding erythema. The healthy granulation tissue that we had at the base of the wound now is a dusky color. The nurse reports green drainage 09/14/14; the area looks somewhat better than last week. There is less erythema and less drainage. The culture I did did not show any growth. Nevertheless I think it is better to continue the Cipro and doxycycline for a further week. The remaining wound area was debridement. 09/21/14. Wound did not require debridement last week. Still less erythema and less drainage. She can complete her antibiotics. The areas of epithelialization in the superior aspect of the wound do not look as healthy as they did some weeks ago 10/05/14 continued improvement in the condition of this wound. There is advancing epithelialization. Less aggressive debridement required 10/19/14 continued improvement in the condition and volume of this wound. Less aggressive debridement to the inferior part of this to remove surface slough and fibrinous eschar 11/02/14 no debridement is required. The surface granulation appears healthy although some of her islands of epithelialization seem to have regressed. No evidence of infection 11/16/14; lites surface debridement done of surface eschar. The wound does not look to be unhealthy. No evidence of infection. Unfortunately the patient has had podiatry issues in the right foot and for some reason has redeveloped small surface ulcerations in  the medial right ankle. Her original presentation involved wounds in this area 11/23/14 no debridement. The area on the  right ankle has enlarged. The left ankle wound appears stable in terms of the surface although there is periwound inflammation. There has been regression in the amount of new skin 11/30/14 no debridement. Both wound areas appear healthy. There was no evidence of infection. The the new area on the right medial ankle has enlarged although that both the surfaces appear to be stable. 12/07/14; Debridement of the right medial ankle wound. No no debridement was done on the left. 12/14/14 no major change in and now bilateral medial ankle wounds. Both of these are very painful but the no overt evidence of infection. She has had previous venous ablation 12/21/14; patient states that her right medial ankle wound is considerably more painful last week than usual. Her left is also somewhat painful. She could not tolerate debridement. The right medial ankle wound has fibrinous surface eschar 12/28/14 this is a patient with severe bilateral venous insufficiency ulcers. For a considerable period of time we actually had the one on the right medial ankle healed however this recently opened up again in June. The left medial ankle wound has been a refractory area with some absent flows. We had some success with Hydrofera Blue on this area and it literally closed by 50% however it is recently opened up Foley. Both of these were debridement today of surface eschar. She tolerates this poorly 01/25/15: No change in the status of this. Thick adherent escar. Very poor tolerance of any attempt at debridement. I had healed the right medial malleolus wound for a considerable amount of time and had the left one down to about 50% of the volume although this is totally regressed over the last 48 weeks. Further the right leg has reopened. she is trying to make a appointment with pain and vascular, previous ablations with  Dr. Aleda Grana. I do not believe there is an arterial insufficiency issue here 02/01/15 the status of the adherent eschar bilaterally is actually improved. No debridement was done. She did not manage to get vascular studies done 02/08/15 continued debridement of the area was done today. The slough is less adherent and comes off with less pressure. There is no surrounding infection peripheral pulses are intact 02/15/15 selective debridement with a disposable curette. Again the slough is less adherent and comes off with less difficulty. No surrounding infection peripheral pulses are intact. 02/22/15 selective debridement of the right medial ankle wound. Slough comes off with less difficulty. No obvious surrounding infection peripheral pulses are intact I did not debridement the one on the left. Both of these are stable to improved 03/01/15 selective debridement of both wound areas using a curette to. Adherent slough cup soft with less difficulty. No obvious surrounding infection. The patient tells me that 2 days ago she noted a rash above the right leg wrap. She did not have this on her lower legs when she change this over she arrives with widespread left greater than right almost folliculitis-looking rash which is extremely pruritic. I don't see anything to culture here. There is no rash on the rest of her body. She feels well systemically. 03/08/15; selective debridement of both wounds using a curette. Base of this does not look unhealthy. She had limegreen drainage coming out of the left leg wound and describes a lot of drainage. The rash on her left leg looks improved to. No cultures were done. 03/22/15; patient was not here last week. Basal wounds does not look healthy and there is no surrounding erythema. No drainage. There is still a  rash on the left leg that almost looks vasculitic however it is clearly limited to the top of where the wrap would be. 04/05/15; on the right required a surgical  debridement of surface eschar and necrotic subcutaneous tissue. I did not debridement the area on the left. These continue to be large open wounds that are not changing that much. We were successful at one point in healing the area on the right, and at the same time the area on the left was roughly half the size of current measurements. I think a lot of the deterioration has to do with the prolonged time the patient is on her feet at work 04/19/15 I attempted-like surface debridement bilaterally she does not tolerate this. She tells me that she was in allergic care yesterday with extreme pain over her left lateral malleolus/ankle and was told that she has an "sprain" 05/03/15; large bilateral venous insufficiency wounds over the medial malleolus/medial aspect of her ankles. She complains of copious amounts of drainage and his usual large amounts of pain. There is some increasing erythema around the wound on the right extending into the medial aspect of her foot to. historically she came in with these wounds the right one healed and the left one came down to roughly half its current size however the right one is reopened and the left is expanded. 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 05/10/15 large bilateral venous insufficiency wounds. There is less adherence surface left however the surface culture that I did last week grew pseudomonas therefore bilateral selective debridement score necessary. There is surrounding erythema. The patient describes severe bilateral drainage and a lot of pain in the left ankle. Apparently her podiatrist was were ready to do a cortisone shot 05/17/15; the patient complains of pain and again copious amounts of drainage. 05/24/15; we used Iodo flex last week. Patient notes considerable improvement in wound drainage. Only needed to change this once. 05/31/15; we continued Iodoflex; the base of these large wounds  bilaterally is not too bad but there is probably likely a significant bioburden here. I would like to debridement just doesn't tolerate it. 06/06/14 I would like to continue the Iodoflex although she still hasn't managed to obtain supplies. She has bilateral medial malleoli or large wounds which are mostly superficial. Both of them are covered circumferentially with some nonviable fibrinous slough although she tolerates debridement very poorly. She apparently has an appointment for an ablation on the right leg by interventional radiology. 06/14/15; the patient arrives with the wounds and static condition. We attempted a debridement although she does not do well with this secondary to pain. I 07/05/15; wounds are not much smaller however there appears to be a cleaner granulating base. The left has tight fibrinous slough greater than the right. Debridement is tolerated poorly due to pain. Iodoflex is done more for these wounds in any of the multitude of different dressings I have tried on the left 1 and then subsequently the right. 07/12/15; no change in the condition of this wound. I am able to do an aggressive debridement on the right but not the left. She simply cannot tolerate it. We have been using Iodoflex which helps somewhat. It is worthwhile remembering that at one point we healed the right medial ankle wound and the left was about 25% of the current circumference. We have suggested returning to vascular surgery for review of possible further ablations for  one reason or another she has not been able to do this. 07/26/15 no major change in the condition of either wound on her medial ankle. I did not attempt to debridement of these. She has been aggressively scrubbing these while she is in the shower at home. She has her supply of Iodoflex which seems to have done more for these wounds then anything I have put on recently. 08/09/15 wound area appears larger although not verified by measurements. Using  Iodoflex 09/05/2015 -- she was here for avisit today but had significant problems with the wound and I was asked to see her for a physician opinion. I have summarize that this lady has had surgery on her left lower extremity about 10 years ago where the possible veins stripping was done. She has had an opinion from interventional radiology around November 2016 where no further sclerotherapy was ordered. The patient works 12 hours a day and stands on a concrete floor with work boots and is unable to get the proper compression she requires and cannot elevate her limbs appropriately at any given time. She has recently grown Pseudomonas from her wound culture but has not started her ciprofloxacin which was called in for her. 09/13/15 this continues to be a difficult situation for this patient. At one point I had this wound down to a 1.5 x 1.5" wound on her left leg. This is deteriorated and the right leg has reopened. She now has substantial wounds on her medial calcaneus, malleoli and into her lower leg. One on the left has surface eschar but these are far too painful for me to debridement here. She has a vascular surgery appointment next week to see if anything can be done to help here. I think she has had previous ablations several years ago at [facility]. She has no major edema. She tells me that she did not get product last time [facility]] and went for several days without it. She continues to work in work boots 12 hours a day. She cannot get compression/4-layer under her work boots. 09/20/15 no major change. Periwound edema control was not very good. Her point with pain and vascular is next Wednesday the 25th 09/28/15; the patient is seen vascular surgery and is apparently scheduled for repeat duplex ultrasounds of her bilateral lower legs next week. 10/05/15; the patient was seen by Dr. Doren Custard of vascular surgery. He feels that she should have arterial insufficiency excluded as cause/contributed to  her nonhealing stage she is therefore booked for an arteriogram. She has apparently monophasic signals in the dorsalis pedis pulses. She also of course has known severe chronic venous insufficiency with previous procedures as noted previously. I had another long discussion with the patient today about her continuing to work 12 hour shifts. I've written her out for 2 months area had concerns about this as her work location is currently undergoing significant turmoil and this may lead to her termination. She is aware of this however I agree with her that she simply cannot continue to stand for 12 hours multiple days a week with the substantial wound areas she has. 10/19/15; the Dr. Doren Custard appointment was largely for an arteriogram which was normal. She does not have an arterial issue. He didn't make a comment about her chronic venous insufficiency for which she has had previous ablations. Presumably it was not felt that anything additional could be done. The patient is now out of work as I prescribed 2 weeks ago. Her wounds look somewhat less  aggravated presumably because of this. I felt I would give debridement another try today 10/25/15; no major change in this patient's wounds. We are struggling to get her product that she can afford into her own home through her insurance. 11/01/15; no major change in the patient's wounds. I have been using silver alginate as the most affordable product. I spoke to Dr. Marla Roe last week with her requested take her to the OR for surgical debridement and placement of ACEL. Dr. Marla Roe told me that she would be willing to do this however [facility] will not cover this, fortunately the patient has [place] healthcare of some variant 11/08/15; no major change in the patient's wounds. She has been completely nonviable surface that this but is in too much pain with any attempted debridement are clinic. I have arranged for her to see Dr. Marla Roe ham of plastic  surgery and this appointment is on Monday. I am hopeful that they will take her to the OR for debridement, possible ACEL ultimately possible skin graft 11/22/15 no major change in the patient's wounds over her bilateral medial calcaneus medial malleolus into the lower legs. Surface on these does not look too bad however on the left there is surrounding erythema and tenderness. This may be cellulitis or could him sleepy tinea. 11/29/15; no major changes in the patient's wounds over her bilateral medial malleolus. There is no infection here and I don't think any additional antibiotics are necessary. There is now plan to move forward. She sees Dr. Marla Roe in a week's time for preparation for operative debridement and ACEL placement I believe on 7/12. She then has a follow-up appointment with Dr. Marla Roe on 7/21 12/28/15; the patient returns today having been taken to the [place] by Dr. Marla Roe 12/12/15 she underwent debridement, intraoperative cultures [which were negative]. She had placement of a wound VAC. Parent really ACEL was not available to be placed. The wound VAC foam apparently adhered to the wound since then she's been using silver alginate, Xeroform under Ace wraps. She still says there is a lot of drainage and a lot of pain 01/31/16; this is a patient I see monthly. I had referred her to Dr. Marla Roe him of plastic surgery for large wounds on her bilateral medial ankles. She has been to the OR twice once in early July and once in early August. She tells me over the last 3 weeks she has been using the wound VAC with ACEL underneath it. On the right we've simply been using silver alginate. Under Kerlix Coban wraps. 02/28/16; this is a patient I'm currently seeing monthly. She is gone on to have a skin graft over her large venous insufficiency ulcer on the left medial ankle. This was done by Dr. Marla Roe him. The patient is a bit perturbed about why she didn't have one on her right medial  ankle wound. She has been using silver alginate to this. 03/06/16; I received a phone call from her plastic surgery Dr. Marla Roe. She expressed some concern about the viability of the skin graft she did on the left medial ankle wound. Asked me to place Endoform on this. She told me she is not planning to do a subsequent skin graft on the right as the left one did not take very well. I had placed Hydrofera Blue on the right 03/13/16; continue to have a reasonably healthy wound on the right medial ankle. Down to 3 mm in terms of size. There is epithelialization here. The area on the  left medial ankle is her skin graft site. I suppose the last week this looks somewhat better. She has an open area inferiorly however in the center there appears to be some viable tissue. There is a lot of surface callus and eschar that will eventually need to come off however none of this looked to be infected. Patient states that the is able to keep the dressing on for several days which is an improvement. 03/20/16 no major change in the circumference of either wound however on the left side the patient was at Dr. Eusebio Friendly office and they did a debridement of left wound. 50% of the wound seems to be epithelialized. I been using Endoform on the left Hydrofera Blue in the right 03/27/16; she arrives today with her wound is not looking as healthy as they did last week. The area on the right clearly has an adherent surface to this a very similar surface on the left. Unfortunately for this patient this is all too familiar problem. Clearly the Endoform is not working and will need to change that today that has some potential to help this surface. She does not tolerate debridement in this clinic very well. She is changing the dressing wants 04/03/16; patient arrives with the wounds looking somewhat better especially on the right. Dr. Migdalia Dk change the dressing to silver alginate when she saw her on Monday and also sold her  some compression socks. The usefulness of the latter is really not clear and woman with severely draining wounds. 04/10/16; the patient is doing a bit of an experiment wearing the compression stockings that Dr. Migdalia Dk provided her to her left leg and the out of legs based dressings that we provided to the right. 05/01/16; the patient is continuing to wear compression stockings Dr. Migdalia Dk provided her on the left that are apparently silver impregnated. She has been using Iodoflex to the right leg wound. Still a moderate amount of drainage, when she leaves here the wraps only last for 4 days. She has to change the stocking on the left leg every night 05/15/16; she is now using compression stockings bilaterally provided by Dr. Marla Roe. She is wearing a nonadherent layer over the wounds so really I don't think there is anything specific being done to this now. She has some reduction on the left wound. The right is stable. I think all healing here is being done without a specific dressing 06/09/16; patient arrives here today with not much change in the wound certainly in diameter to large circular wounds over the medial aspect of her ankle bilaterally. Under the light of these services are certainly not viable for healing. There is no evidence of surrounding infection. She is wearing compression stockings with some sort of silver impregnation as prescribed by Dr. Marla Roe. She has a follow-up with her tomorrow. 06/30/16; no major change in the size or condition of her wounds. These are still probably covered with a nonviable surface. She is using only her purchase stockings. She did see Dr. Marla Roe who seemed to want to apply Dakin's solution to this I'm not extreme short what value this would be. I would suggest Iodoflex which she still has at home. 07/28/16; I follow Mrs. Driskill episodically along with Dr. Marla Roe. She has very refractory venous insufficiency wounds on her bilateral medial legs  left greater than right. She has been applying a topical collagen ointment to both wounds with Adaptic. I don't think Dr. Marla Roe is planning to take her back to the OR. 08/19/16;  I follow Mrs. Jeneen Rinks on a monthly basis along with Dr. Marla Roe of plastic surgery. 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 I been following her for a number of years. At one point I was able to get the right medial malleolus wound to heal and had the left medial malleolus down to about half its current size however and I had to send her to plastic surgery for an operative debridement. Since then things have been stable to slightly improve the area on the right is slightly better one in the left about the same although there is much less adherent surface than I'm used to with this patient. She is using some form of liquid collagen gel that Dr. Marla Roe provided a Kerlix cover with the patient's own pressure stockings. She tells me that she has extreme pain in both ankles and along the lateral aspect of both feet. She has been unable to work for some period of time. She is telling me she is retiring at the beginning of April. She sees Dr. Doran Durand of orthopedics next week 09/22/16; patient has not seen Dr. Marla Roe since the last time she is here. I'm not really sure what she is using to the wounds other than bits and pieces of think she had left over including most recently Hydrofera Blue. She is using juxtalite stockings. She is having difficulty with her husband's recent illness "stroke". She is having to transport him to various doctors appointments. Dr. Marla Roe left her the option of a repeat debridement with ACEL however she has not been able to get the time to follow-up on this. She continues to have a fair amount of drainage out of these wounds with certainly precludes leaving dressings on all week 10/13/16; patient has not seen Dr.  Marla Roe since she was last in our clinic. I'm not really sure what she is doing with the wounds, we did try to get her [facility] and I think she is actually using this most of the time. Because of drainage she states she has to change this every second day although this is an improvement from what she used to do. She went to see Dr. Doran Durand who did not think she had a muscular issue with regards to her feet, he referred her to a neurologist and I think the appointment is sometime in June. I changed her back to Iodoflex which she has used in the past but not recently. 11/03/16; the patient has been using Iodoflex although she ran out of this. Still claims that there is a lot of drainage although the wound does not look like this. No surrounding erythema. She has not been back to see Dr. Marla Roe 11/24/16; the patient has been using Iodoflex again but she ran out of it 2 or 3 days ago. There is no major change in the condition of either one of these wounds in fact they are larger and covered in a thick adherent surface slough/nonviable tissue especially on the left. She does not tolerate mechanical debridement in our clinic. Going back to see Dr. Marla Roe of plastic surgery for an operative debridement would seem reasonable. 12/15/16; the patient has not been back to see Dr. Marla Roe. She is been dealing with a series of illnesses and her husband which of monopolized her time. She is been using Sorbact which we largely supplied. She states the drainage is bad enough that it maximum she can go 2-3 days without changing the  dressing 01/12/2017 -- the patient has not been back for about 4 weeks and has not seen Dr. Marla Roe not does she have any appointment pending. 01/23/17; patient has not seen Dr. Marla Roe even though I suggested this previously. She is using Santyl that was suggested last week by Dr. Con Memos this Cost her $16 through her insurance which is indeed surprising 02/12/17;  continuing Santyl and the patient is changing this daily. A lot of drainage. She has not been back to see plastic surgery she is using an Ace wrap. Our intake nurse suggested wrap around stockings which would make a good reasonable alternative 02/26/17; patient is been using Santyl and changing this daily due to drainage. She has not been to see plastic surgery she uses in April Ace wrap to control the edema. She did obtain extremitease stockings but stated that the edema in her leg was to big for these 03/20/17; patient is using Santyl and Anasept. Surfaces looked better today the area on the right is actually measuring a little smaller. She has states she has a lot of pain in her feet and ankles and is asking for a consult to pain control which I'll try to help her with through our case manager. 04/10/17; the patient arrives with better-looking wound surfaces and is slightly smaller wound on the left she is using a combination of Santyl and Anasept. She has an appointment or at least as started in the pain control center associated with [facility] 05/14/17; this is a patient who I followed for a prolonged period of time. She has venous insufficiency ulcers on her bilateral medial ankles. At one point I had this down to a much smaller wound on the left however these reopened and we've never been able to get these to heal. She has been using Santyl and Anasept gel although 2 weeks ago she ran out of the Anasept gel. She has a stable appearance of the wound. She is going to the wound care clinic at [facility]. They wanted do a nerve block/spinal block although she tells me she is reluctant to go forward with that. 05/21/17; this is a patient I have followed for many years. She has venous insufficiency ulcers on her bilateral medial ankles. Chronic pain and deformity in her ankles as well. She is been to see plastic surgery as well as orthopedics. Using PolyMem AG most recently/Kerramax/ABDs  and 2 layer compression. She has managed to keep this on and she is coming in for a nurse check to change the dressing on Tuesdays, we see her on Fridays 06/05/17; really quite a good looking surface and the area especially on the right medial has contracted in terms of dimensions. Well granulated healthy-looking tissue on both sides. Even with an open curet there is nothing that even feels abnormal here. This is as good as I've seen this in quite some time. We have been using PolyMem AG and bringing her in for a nurse check 06/12/17; really quite good surface on both of these wounds. The right medial has contracted a bit left is not. We've been using PolyMem and AG and she is coming in for a nurse visit 06/19/17; we have been using PolyMem AG and bringing her in for a nurse check. Dimensions of her wounds are not better but the surfaces looked better bilaterally. She complained of bleeding last night and the left wound and increasing pain bilaterally. She states her wound pain is more neuropathic than just the wounds. There was some  suggestion that this was radicular from her pain management doctor in talking to her it is really difficult to sort this out. 06/26/17; using PolyMem and AG and bringing her in for a nurse check as All of this and reasonably stable condition. Certainly not improved. The dimensions on the lateral part of the right leg look better but not really measuring better. The medial aspect on the left is about the same. 07/03/16; we have been using PolyMen AG and bringing her in for a nurse check to change the dressings as the wounds have drainage which precludes once weekly changing. We are using all secondary absorptive dressings.our intake nurse is brought up the idea of using a wound VAC/snap VAC on the wound to help with the drainage to see if this would result in some contraction. This is not a bad idea. The area on the right medial is actually looking smaller. Both wounds have a  reasonable-looking surface. There is no evidence of cellulitis. The edema is well controlled 07/10/17; the patient was denied for a snap VAC by her insurance. The major issue with these wounds continues to be drainage. We are using wicked PolyMem AG and she is coming in for a nurse visit to change this. The wounds are stable to slightly improved. The surface looks vibrant and the area on the right certainly has shrunk in size but very slowly 07/17/17; the patient still has large wounds on her bilateral medial malleoli. Surface of both of these wounds looks better. The dimensions seem to come and go but no consistent improvement. There is no epithelialization. We do not have options for advanced treatment products due to insurance issues. They did not approve of the wound VAC to help control the drainage. More recently we've been using PolyMem and AG wicked to allow drainage through. We have been bringing her in for a nurse visit to change this. We do not have a lot of options for wound care products and the home again due to insurance issues 07/24/17; the patient's wound actually looks somewhat better today. No drainage measurements are smaller still healthy-looking surface. We used silver collagen under PolyMen started last week. We have been bringing her in for a dressing change 07/31/17; patient's wound surface continued to look better and I think there is visible change in the dimensions of the wound on the right. Rims of epithelialization. We have been using silver collagen under PolyMen and bringing her in for a dressing change. There appears to be less drainage although she is still in need of the dressing change 08/07/17. Patient's wound surface continues to look better on both sides and the area on the right is definitely smaller. We have been using silver collagen and PolyMen. She feels that the drainage has been it has been better. I asked her about her vascular status. She went to see Dr.  Aleda Grana at [facility] and had some form of ablation. I don't have much detail on this. I haven't my notes from 2016 that she was not a candidate for any further ablation but I don't have any more information on this. We had referred her to vein and vascular I don't think she ever went. He does not have a history of PAD although I don't have any information on this either. We don't even have ABIs in our record 08/14/17; we've been using silver collagen and PolyMen cover. And putting the patient and compression. She we are bringing her in as a nurse visit to change  this because ofarge amount of drainage. We didn't the ABIs in clinic today since they had been done in many moons 1.2 bilaterally. She has been to see vein and vascular however this was at [facility] and she had ablation although I really don't have any information on this all seemed biking get a report. She is also been operatively debrided by plastic surgery and had a cell placed probably 8-12 months ago. This didn't have a major effect. We've been making some gains with current dressings 08/19/17-She is here in follow-up evaluation for bilateral medial malleoli ulcers. She continues to tolerate debridement very poorly. We will continue with recently changed topical treatment; if no significant improvement may consider switching to Iodosorb/Iodoflex. 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 She has been using silver collagen and PolyMem. I believe she has been used and tried on Iodoflex before. 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 These have been very refractory over time. We have been using silver collagen and PolyMen. She is coming in once a week for a doctor's and once a week for  nurse visits. We are actually making some progress 09/18/17; the patient's wounds are smaller especially on the right medial. She arrives today to upset to consider even washing these off with Anasept which I think is been part of the reason this is been closing. We've been using collagen covered in PolyMen otherwise. It is noted that she has a small area of folliculitis on the right medial calf that. As we are wrapping her legs I'll give her a short course of doxycycline to make sure this doesn't amount to anything. She is a long list of complaints today including imbalance, shortness of breath on exertion, inversion of her left ankle. With regards to the latter complaints she is been to see orthopedics and they offered her a tendon release surgery I believe but wanted her wounds to be closed first. I have recommended she go see her primary physician with regards to everything else. 09/25/17; patient's wounds are about the same size. We have made some progress bilaterally although not in recent weeks. She will not allow me T wash these wounds with Anasept even if she is doing her cell. Wheeze we've been using collagen covered in PolyMen. Last week she had a small area of folliculitis this is now opened into a small wound. She completed 5 days of trimethoprim sulfamethoxazole 10/02/17; unfortunately the area on her left medial ankle is worse with a larger wound area towards the Achilles. The patient complains of a lot of pain. She will not allow debridement although visually I don't think there is anything to debridement in any case. We have been using silver collagen and PolyMen for several months now. Initially we are making some progress although I'm not really seeing that today. We will move back to [facility]. His admittedly this is a bit of a repeat however I'm hoping that his situation is different now. The patient tells me she had her leg on the left give out on her yesterday this is process  some pain. 10/09/17; the patient is seen twice a week largely because of drainage issues coming out of the chronic medial bimalleolar wounds that are chronic. Last week the dimensions of the one on the  left looks a little larger I changed her to [facility]. She comes in today with a history of terrible pain in the bilateral wound areas. She will not allow debridement. She will not even allow a tissue culture. There is no surrounding erythema no no evidence of cellulitis. We have been putting her Kerlix Coban man. She will not allow more aggressive compression as there was a suggestion to put her in 3 layer wraps. 10/16/17; large wounds on her bilateral medial malleoli. These are chronic. Not much change from last week. The surface looks have healthy but absolutely no epithelialization. A lot of pain little less so of drainage. She will not allow debridement or even washing these off in the vigorous fashion with Anasept. 10/23/17; large wounds on her bilateral malleoli which are chronic. Some improvement in terms of size perhaps on the right since last time I saw these. She states that after we increased the 3 layer compression there was some bleeding, when she came in for a nurse visit she did not want 3 layer compression put back on about our nurse managed to convince her. She has known chronic venous visit issues and I'm hoping to get her to tolerate the 3 layer compression. using Hydrofera Blue 10/30/17; absolutely no change in the condition of either wound although we've had some improvement in dimensions on the right.. Attempted to put her in 3 layer compression she didn't tolerated she is back in 2 layer compression. We've been using Hydrofera Blue We looked over her past records. She had venous reflux studies in November 2016. There was no evidence of deep venous reflux on the right. Superficial vein did not show the greater saphenous vein at think this is been previously ablated the small  saphenous vein was within normal limits. The left deep venous system showed no DVT the vessels were positive for deep venous reflux in the posterior tibial veins at the ankle. The greater saphenous vein was surgically absent small saphenous vein was within normal limits. She went to vein and vascular at [facility]. I believe she had an ablation on the left greater saphenous vein. I'll update her reflux studies perhaps ever reviewed by vein and vascular. We've made absolutely no progress in these wounds. Will also try to read and TheraSkins through her insurance 11/06/17; W the patient apparently has a 2 week follow-up with vein and vascular I like him to review the whole issue with regards to her previous vascular workup by Dr. Aleda Grana. We've really made no progress on these wounds in many months. She arrives today with less viable looking surface on the left medial ankle wound. This was apparently looking about the same on Tuesday when she was here for nurse visit. 11/13/17; deep tissue culture I did last time of the left lower leg showed multiple organisms without any predominating. In particular no Staphylococcus or group A strep were isolated. We sent her for venous reflux studies. She's had a previous left greater saphenous vein stripping and I think sclerotherapy of the right greater saphenous vein. She didn't really look at the lesser saphenous vein this both wounds are on the medial aspect. She has reflux in the common femoral vein and popliteal vein and an accessory vein on the right and the common femoral vein and popliteal vein on the left. I'm going to have her go to see vein and vascular just the look over things and see if anything else beside aggressive compression is indicated here. We have not  been able to make any progress on these wounds in spite of the fact that the surface of the wounds is never look too bad. 11/20/17; no major change in the condition of the wounds. Patient  reports a large amount of drainage. She has a lot of complaints of pain although enlisting her today I wonder if some of this at least his neuropathic rather than secondary to her wounds. She has an appointment with vein and vascular on 12/30/17. The refractory nature of these wounds in my mind at least need vein and vascular to look over the wounds the recent reflux studies we did and her history to see if anything further can be done here. I also note her gait is deteriorated quite a bit. 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 She has a bilateral Trendelenburg gait. I wonder if this is neuropathic or perhaps multilevel radicular. 11/27/17; her wounds actually looks slightly better. Healthy-looking granulation tissue a scant amount of epithelialization. [place] healthcare will not pay for [employer]. They will play for tri layer Oasis and Dermagraft. This is not a diabetic ulcer. We'll try for the tri layer Oasis. She still complains of some drainage. She has a vein and vascular appointment on 12/30/17 12/04/17; the wounds visually look quite good. Healthy-looking granulation with some degree of epithelialization. We are still waiting for response to our request for trial to try layer Oasis. Her appointment with vascular to review venous and arterial issues isn't sold the end of July 7/31. Not allow debridement or even vigorous cleansing of the wound surface. 12/18/17; slightly smaller especially on the right. 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 We've been using Hydrofera Blue. We still are looking into triple layer Oasis through her insurance 01/08/18 on evaluation today patient's wound actually appears to be showing signs of good improvement at this point in time. She has been tolerating the dressing changes without complication. Fortunately there does not appear to be any evidence of infection at this point in time. We have been utilizing silver nitrate  which does seem to be of benefit for her which is also good news. Overall I'm very happy with how things seem to be both regards appearance as well as measurement. Patient did see Dr. Bridgett Larsson for evaluation on 12/30/17. In his assessment he felt that stripping would not likely add much more than chronic compression to the patient's healing process. His recommendation was to follow-up in three months with Dr. Doren Custard if she hasn't healed in order to consider referral back to you and see vascular where she previously was in a trial and was able to get her wound to heal. I'll be see what she feels she when you staying compression and he reiterated this as well. 01/13/18 on evaluation today patient appears to actually be doing very well in regard to her bilateral medial malleolus ulcers. She seems to have tolerated the chemical cauterization with silver nitrate last week she did have some pain through that evening but fortunately states that I'll be see since it seems to be doing better she is overall pleased with the progress. 01/21/18; really quite a remarkable improvement since I've last seen these wounds. We started using silver nitrate specially on the islands of hyper granulation which for some reason her around the wound circumference. This is really done quite nicely. Primary dressing Hydrofera Blue under 4 layer compression. She seems to be able to hold out without a nurse rewrap. Follow-up in 1 week  01/28/18; we've continued the hydrofera blue but continued with chemical cauterization to the wound area that we started about a month ago for irregular hyper granulation. She is made almost stunning improvement in the overall wound dimensions. I was not really expecting this degree of improvement in these chronic wounds 02/05/18; we continue with Hydrofera Bluebut of also continued the aggressive chemical cauterization with silver nitrate. We made nice progress with the right greater than left  wound. 02/12/18. We continued with Hydrofera Blue after aggressive chemical cauterization with silver nitrate. We appear to be making nice progress with both wound areas 02/19/2018; we continue with [facility] after washing the wounds vigorously with Anasept spray and chemical cauterization with silver nitrate. We are making excellent progress. The area on the right's just about closed 02/26/2018. The area on the left medial ankle had too much necrotic debris today. I used a #5 curette we are able to get most of the soft. I continued with the silver nitrate to the much smaller wound on the right medial ankle she had a new area on her right lower pretibial area which she says was due to a role in her compression 03/05/2018; both wound areas look healthy. Not much change in dimensions from last week. I continue to use silver nitrate and Hydrofera Blue. The patient saw Dr. Doren Custard of vein and vascular. He felt she had venous stasis ulcers. He felt based on her previous arteriogram she should have adequate circulation for healing. Also she has deep venous reflux but really no significant correctable superficial venous reflux at this time. He felt we should continue with conservative management including leg elevation and compression 04/02/2018; since we last saw this woman about a month ago she had a fall apparently suffered a pelvic fracture. I did not look up the x-ray. Nevertheless because of pain she literally was bedbound for 2 weeks and had home health coming out to change the dressing. Somewhat predictably this is resulted in considerable improvement in both wound areas. The right is just about closed on the medial malleolus and the left is about half the size. 04/16/2018; both her wounds continue to go down in size. Using Hydrofera Blue. 05/07/18; both her wounds appeared to be improving especially on the right where it is almost closed. We are using Hydrofera Blue 05/14/2018; slightly worse this  week with larger wounds. Surface on the left medial not quite as good. We have been using Hydrofera Blue 05/21/18; again the wounds are slightly larger. Left medial malleolus slightly larger with eschar around the circumference. We have been using Hydrofera Blue undergoing a wraps for a prolonged period of time. This got a lot better when she was more recumbent due to a fall and a back injury. I change the primary dressing the silver alginate today. She did not tolerate a 4 layer compression previously although I may need to bring this up with her next time 05/28/2018; area on the left medial malleolus again is slightly larger with more drainage. Area on the right is roughly unchanged. She has a small area of folliculitis on the right medial just on the lower calf. This does not look ominous. 06/03/2018 left medial malleolus slightly smaller in a better looking surface. We used silver nitrate on this last time with silver alginate. The area on the right appears slightly smaller 1/10; left medial malleolus slightly smaller. Small open area on the right. We used silver nitrate and silver alginate as of 2 weeks ago. We continue with  the wound and compression. These got a lot better when she was off her feet 1/17; right medial malleolus wound is smaller. The left may be slightly smaller. Both surfaces look somewhat better. 1/24; both wounds are slightly smaller. Using silver alginate under Unna boots 1/31; both wounds appear smaller in fact the area on the right medial is just about closed. Surface eschar. We have been using silver alginate under Unna boots. The patient is less active now spends let much less time on her feet and I think this is contributed to the general improvement in the wound condition 2/7; both wounds appear smaller. I was hopeful the right medial would be closed however there there is still the same small open area. Slight amount of surface eschar on the left the dimensions are  smaller there is eschar but the wound edges appear to be free. We have been using silver alginate under Unna boot's 2/14; both wounds once again measure smaller. Circumferential eschar on the left medial. We have been using silver alginate under Unna boots with gradual improvement 2/21; the area on the right medial malleolus has healed. The area on the left is smaller. We have been using silver alginate and Unna boots. We can discharge wrapping the right leg she has 20/30 stockings at home she will need to protect the scar tissue in this area 2/28; the area on the right medial malleolus remains closed the patient has a compression stocking. The area on the left is smaller. We have been using silver alginate and Unna boots. 3/6 the area on the right medial ankle remains closed. Good edema control noted she is using her own compression stocking. The area on the left medial ankle is smaller. We have been managing this with silver alginate and Unna boots which we will continue today. 3/13; the area on the right medial ankle remains closed and I'm declaring it healed today. When necessary the left is about the same still a healthy-looking surface but no major change and wound area. No evidence of infection and using silver alginate under unna and generally making considerable improvement 3/27 the area on the right medial ankle remains closed the area on the left is about the same as last week. Certainly not any worse we have been using silver alginate under an Unna boot 4/3; the area on the right medial ankle remains closed per the patient. We did not look at this wound. The wound on the left medial ankle is about the same surface looks healthy we have been using silver alginate under an Unna boot 4/10; area on the right medial ankle remains closed per the patient. We did not look at this wound. The wound on the left medial ankle is slightly larger. The patient complains that the [employer] caused  burning pain all week. She also told us that she was a lot more active this week. Changed her back to silver alginate 4/17; right medial ankle still closed per the patient. Left medial ankle is slightly larger. Using silver alginate. She did not tolerate Hydrofera Blue on this area 4/24; right medial ankle remains closed we have not look at this. The left medial ankle continues to get larger today by about a centimeter. We have been using silver alginate under Unna boots. She complains about 4 layer compression as an alternative. She has been up on her feet working on her garden 5/8; right medial ankle remains closed we did not look at this. The left medial ankle has  increased in size about 100%. We have been using silver alginate under Unna boots. She noted increased pain this week and was not surprised that the wound is deteriorated 5/15; no major change in SA however much less erythema ( one week of doxy ocellulitis). 5/22-66 year old female returns at 1 week to the clinic for left medial ankle wound for which we have been using silver alginate under 3 layer compression She was placed on DOXY at last visit - the wound is wider at this visit. She is in 3 layer compression 5/29; change to [facility] last week. I had given her empiric doxycycline 2 weeks ago for a week. She is in 3 layer compression. She complains of a lot of pain and drainage on presentation today. 6/5; using Hydrofera Blue. I gave her doxycycline recently empirically for erythema and pain around the wound. Believe her cultures showed enterococcus which not would not have been well covered by doxycycline nevertheless the wound looks better and I don't feel specifically that the enterococcus needs to be covered. She has a new what looks like a wrap injury on her lateral left ankle. 6/12; she is using Hydrofera Blue. She has a new area on the left anterior lower tibial area. This was a wrap injury last week. 6/19; the patient is  using Hydrofera Blue. She arrived with marked inflammation and erythema around the wound and tenderness. 12/01/18 on evaluation today patient appears to be doing a little bit better based on what I'm hearing from the standpoint of lassos evaluation to this as far as the overall appearance of the wound is concerned. Then sometime substandard she typically sees Dr. Dellia Nims. Nonetheless overall very pleased with the progress that she's made up to this point. No fevers, chills, nausea, or vomiting noted at this time. 7/10; some improvement in the surface area. Aggressively debrided last week apparently. I went ahead with the debridement today although the patient does not tolerate this very well. We have been using Iodoflex. Still a fair amount of drainage 7/17; slightly smaller. Using Iodoflex. 7/24; no change from last week in terms of surface area. We have been using Iodoflex. Surface looks and continues to look somewhat better 7/31; surface area slightly smaller better looking surface. We have been using Iodoflex. This is under Unna boot compression 8/7-Patient presents at 1 week with Unna boot and Iodoflex, wound appears better 8/14-Patient presents at 1 week with Iodoflex, we use the Unna boot, wound appears to be stable better.Patient is getting Botox treatment for the inversion of the foot for tendon release, Next week 8/21; we are using Iodoflex. Unna boot. The wound is stable in terms of surface area. Under illumination there is some areas of the wound that appear to be either epithelialized or perhaps this is adherent slough at this point I was not really clear. It did not wipe off and I was reluctant to debride this today. 8/28; we are using Iodoflex in an Unna boot. Seems to be making good improvement. 9/4; using Iodoflex and wound is slightly smaller. 9/18; we are using Iodoflex with topical silver nitrate when she is here. The wound continues to be smaller 10/2; patient missed her  appointment last week due to GI issues. She left and Iodoflex based dressing on for 2 weeks. Wound is about the same size about the size of a dime on the left medial lower 10/9 we have been using Iodoflex on the medial left ankle wound. She has a new superficial probable wrap injury  on the dorsal left ankle 10/16; we have been using Hydrofera Blue since last week. This is on the left medial ankle 10/23; we have been using Hydrofera Blue since 2 weeks ago. This is on the left medial ankle. Dimensions are better 11/6; using Hydrofera Blue. I think the wound is smaller but still not closed. Left medial ankle 11/13; we have been using Hydrofera Blue. Wound is certainly no smaller this week. Also the surface not as good. This is the remanent of a very large area on her left medial ankle. 11/20; using Sorbact since last week. Wound was about the same in terms of size although I was disappointed about the surface debris 12/11; 3-week follow-up. Patient was on vacation. Wound is measuring slightly larger we have been using Sorbact. 12/18; wound is about the same size however surface looks better last week after debridement. We have been using Sorbact under compression 1/15 wound is probably twice the size of last time increased in length nonviable surface. We have been using Sorbact. She was running a mild fever and missed her appointment last week 1/22; the wound is come down in size but under illumination still a very adherent debris we have been Hydrofera Blue that I changed her to last week 1/29; dimensions down slightly. We have been using Hydrofera Blue 2/19 dimensions are the same however there is rims of epithelialization under illumination. Therefore more the surface area may be epithelialized 2/26; the patient's wound actually measures smaller. The wound looks healthy. We have been using Hydrofera Blue. I had some thoughts about running Apligraf then I still may do that however this looks so much  better this week we will delay that for now 3/5; the wound is small but about the same as last week. We have been using Hydrofera Blue. No debridement is required today. 3/19; the wound is about the size of a dime. Healthy looking wound even under illumination. We have been using Hydrofera Blue. No mechanical debridement is necessary 3/26; not much change from last week although still looks very healthy. We have been using Hydrofera Blue under Unna boots Patient was offered an ankle fusion by podiatry but not until the wound heals with a proceed with this. 4/9; the patient comes in today with her original wound on the medial ankle looking satisfactory however she has some uncontrolled swelling in the middle part of her leg with 2 new open areas superiorly just lateral to the tibia. I think this was probably a wrap issue. She said she felt uncomfortable during the week but did not call in. We have been using Hydrofera Blue 4/16; the wound on the medial ankle is about the same. She has innumerable small areas superior to this across her mid tibia. I think this is probably folliculitis. She is also been working in the yard doing a lot of sweating 4/30; the patient issue on the upper areas across her mid tibia of all healed. I think this was excessive yard work if I remember. Her wound on the medial ankle is smaller. Some debris on this we have been using Hydrofera Blue under Unna boots 5/7; mid tibia. She has been using Hydrofera Blue under an Unna wrap. She is apparently going for her ankle surgery on June 3 10/28/19-Patient returns to clinic with the ankle wound, we are using Hydrofera Blue under Unna wrap, surgery is scheduled for her left foot for June 3 so she will be back for nurse visit next week READMISSION 01/17/2020 Mrs.  [name] is a 66 year old woman we have had in this clinic for a long period of time with severe venous hypertension and refractory wounds on her medial lower legs and ankles  bilaterally. 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 When she was off her legs for a prolonged period example when she fell and suffered a compression fracture things would heal up quite nicely. She is now retired and we managed to heal up the right medial leg wound. The left one was very tiny last time I saw this although still refractory. 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 It got to the point where this was interfering with her walking and she elected to proceed with a ankle arthrodesis to straighten her her ankle and leave her with a functional outcome for mobilization. The patient was referred to Dr. Doren Custard and really this took some time to arrange. Dr. Doren Custard saw her on 12/07/2019. Once again he verified that she had no arterial issues. She had previously had an angiogram several years ago. Follow-up ABIs on the left showed an ABI of 1.12 with triphasic waveforms and a TBI of 0.92. She is felt to have chronic deep venous insufficiency but I do not think it was felt that anything could be done from about this from an ablation point of view. At the time Dr. Doren Custard saw this patient the wounds actually look closed via the pictures in his clinic. The patient finally underwent her surgery on 12/15/2019. This went reasonably well and there was a good anatomic outcome. She developed a small distal wound dehiscence on the lateral part of the surgical wound. However more problematically she is developed recurrence of the wound on the medial left ankle. There are actually 2 wounds here one in the distal lower leg and 1 pretty much at the level of the medial malleolus. It is a more distal area that is more problematic. She has been using Hydrofera Blue which started on Friday before this she was  simply Ace wrapping. There was a culture done that showed Pseudomonas and she is on ciprofloxacin. A recent CNS on 8/11 was negative. The patient reports some pain but I generally think this is improving. She is using a cam boot completely nonweightbearing using a walker for pivot transfers and a wheelchair 8/24; not much improvement unfortunately she has a surgical wound on the lateral part in the venous insufficiency wound medially. The bottom part of the medial insufficiency wound is still necrotic there is exposed tendon here. We have been using Hydrofera Blue under compression. Her edema control is however better 8/31; patient in for follow-up of his surgical wound on the lateral part of her left leg and chronic venous insufficiency ulcers medially. We put her back in compression last week. She comes in today with a complaint of 3 or 4 days worth of increasing pain. She felt her cam walker was rubbing on the area on the back of her heel. However there is intense erythema seems more likely she has cellulitis. She had 2 cultures done when she was seeing podiatry in the postop. One of them in late July showed Pseudomonas and she received a course of ciprofloxacin the other was negative on 8/11 she is allergic to penicillin with anaphylactoid complaints of hives oral  swelling via information in epic 9/9; when I saw this patient last week she had intense anterior erythema around her wound on the right lateral heel and ankle and also into the right medial heel. Some of this was no doubt drainage and her walker boot however I was convinced she had cellulitis. I gave her Levaquin and Bactrim she is finishing up on this now. She is following up with Dr. [name] he saw her yesterday. He is taken her out of the walking boot of course she is still nonweightbearing. Her x-ray was negative for any worrisome features such as soft tissue air etc. Things are a lot better this week. She has home health. We have been  using Hydrofera Blue under an [employer] which she put back on yesterday. I did not wrap her last week 9/17; her surrounding skin looks a lot better. In fact the area on the left lateral ankle has just a scant amount of eschar. The only remaining wound is the large area on the left medial ankle. Probably about 60% of this is healthy granulation at the surface however she has a significant divot distally. This has adherent debris in it. I been using debridement and silver collagen to try and get this area to fill-in although I do not think we have made much progress this week 9/24; the patient's wound on the left medial ankle looks a lot better. The deeper divot area distally still requires debridement but this is cleaning up quite nicely we have been using silver collagen. The patient is complaining of swelling in her foot and is worried that that is contributing to the nonhealing of the ankle wound. She is also complaining of numbness in her anterior toes 10/4; left medial ankle. The small area distally still has a divot with necrotic material that I have been debriding away. This has an undermining area. She is approved for Apligraf. She saw Dr. [name] her surgeon on 10/1. I think he declared himself is satisfied with the condition of things. Still nonweightbearing till the end of the month. 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 Her surgical wound is well closed. There is no evidence of infection 10/11; the patient arrived in clinic today with the expectation that we be able to put an Apligraf on this area after debridement however she arrives with a relatively large amount of green drainage on the dressing. The patient states that this started on Friday. She has not been systemically unwell. 10/19; culture I did last week showed both Enterococcus and Pseudomonas. I think this came in separate parts because I stopped her ciprofloxacin I gave her and prescribed her  linezolid however now looking at the final culture result this is Pseudomonas which is resistant to quinolones. She has not yet picked up the linezolid apparently phone issues. We are also trying to get a topical antibiotic out of [place] in [place] they can be applied by home health. She is still having green drainage 10/16; the patient has her topical antibiotic from [facility] in [place]. This is a compounded gel with vancomycin and ciprofloxacin and gentamicin. We are applying this on the wound bed with silver alginate over the top with Unna boot wraps. She arrives in clinic today with a lot less ominous looking drainage although she is only use this topical preparation once the second time today. She sees Dr. [name] her surgeon on Friday she has home health changing the dressing 11/2; still using  her compounded topical antibiotic under silver alginate. Surface is cleaning up there is less drainage. We had an Apligraf for her today and I elected to apply it. A light coating of her antibiotic 04/25/2020 upon evaluation today patient appears to be doing well with regard to her ankle ulcer. There is a little bit of slough noted on the surface of the wound I am can have to perform sharp debridement to clear this away today. With that being said other than that fact overall I feel like she is making progress and we do see some new epithelial growth. There is also some improvement in the depth of the wound and that distal portion. There is little bit of slough there as well. 12/7; 2-week follow-up. Apligraf #3. Dimensions are smaller. Closing in especially inferiorly. Still some surface debris. Still using the [facility] topical antibiotic but I told her that I don't think this needs to be renewed 12/21; 2-week follow-up. Apligraf #4 dimensions are smaller. Nice improvement 06/05/2020; 2-week follow-up. The patient's wound on the left medial ankle looks really excellent. Nice granulation.  Advancing epithelialization no undermining no evidence of infection. We would have to reapply for another Apligraf but with the condition of this wound I did not feel strongly about it. We used Hydrofera Blue under the same degree of compression. She follows up with Dr. [name] her surgeon a week Friday 06/13/2020 upon evaluation today patient appears to be doing excellent in regard to her wound. She has been tolerating the dressing changes without complication. Fortunately there is no signs of active infection at this time. No fevers, chills, nausea, vomiting, or diarrhea. She was using Hydrofera Blue last week. 06/20/2020 06/20/2020 on evaluation today patient actually appears to be doing excellent in regard to her wound. This is measuring better and looking much better as well. She has been using the collagen that seems to be doing better for her as well even though the [facility] was and is not sticking or feeling as rough on her wound. She did see Dr. [name] on Friday he is very pleased he also stated none of the hardware has shifted. That is great news 1/27; the patient has a small clean wound all that is remaining. I agree that this is too small to really consider an Apligraf. Under illumination the surface is looking quite good. We have been using collagen although the dimensions are not any better this week 2/2; the patient has a small clean wound on the left medial ankle. Although this left of her substantial original areas. Measurements are smaller. We have been using polymen Ag under an Haematologist. 2/10; small area on the left medial ankle. This looks clean nothing to debride however dimensions are about the same we have been using polymen I think now for 2 weeks 2/17; not much change in surface area. We have been using polymen Ag without any improvement. 3/17; 1 month follow-up. The patient has been using endoform without any improvement in fact I think this looks worse with more depth and  more expansion 3/24; no improvement. Perhaps less debris on the surface. We have been using Sorbact for 1 week 4/4; wound measures larger. She has edema in her leg and her foot which she tells as her wrap came down. We have been using Unna boots. Sorbact of the wound. She has been approved for Apligraf 09/12/2020 upon evaluation today patient appears to be doing well with regard to her wound. We did get the Apligraf reapproved  which is great news we have that available for application today. Fortunately there is no signs of infection and overall the patient seems to be doing great. The wound bed is nice and clean. 4/27; patient presents for her second application of Apligraf. She states over the past week she has been on her feet more often due to being outside in her garden. She has noted more swelling to her foot as a result. She denies increased warmth, pain or erythema to the wound site. 10/10/2020 upon evaluation today patient appears to be doing well with regard to her wound which does not appear to be quite as irritated as last week from what I am hearing. With that being said unfortunately she is having issues with some erythema and warmth to touch as well as an increase size. I do believe this likely is infected. 10/17/2020 upon evaluation today patient appears to be doing excellent in regard to her wound this is significantly improved as compared to last week. Fortunately I think that the infection is much better controlled at this point. She did have evidence of both Enterococcus as well as Staphylococcus noted on culture. Enterococcus really would not be helped significantly by the Cipro but the wound is doing so much better I am under the assumption that the Staphylococcus is probably the main organism that is causing the current infection. Nonetheless I think that she is doing excellent as far as that is concerned and I am very pleased in that regard. I would therefore recommend she  continue with the Cipro. 10/31/2020 upon evaluation today patient appears to be doing well with regard to her wound. She has been tolerating the dressing changes without complication. Fortunately there is no signs of active infection and overall I am extremely pleased with where things stand today. No fevers, chills, nausea, vomiting, or diarrhea. With that being said she does have some green drainage coming from the wound and although it looks okay I am a little concerned about the possibility of a continuing infection. Specifically with Pseudomonas. For that reason I will go ahead and send in a prescription for Cipro for her to be continued. 11/14/2020 upon evaluation today patient appears to be doing very well currently in regard to her wound on her leg. She has been tolerating the dressing changes without complication. Fortunately I feel like the infection is finally under good control here. Unfortunately we do not have the Apligraf for application today although we can definitely order to have it in place for next week. That will be her fifth and final of the current series. Nonetheless I feel like her wound is really doing quite well which is great news. 11/21/2020 upon evaluation today patient appears to be doing well with regard to her wound on the medial ankle. Fortunately I think the infection is under control and I do believe we can go ahead and reapply the Apligraf today. She is in agreement with that plan. There does not appear to be any signs of active infection at this time which is great news. No fevers, chills, nausea, vomiting, or diarrhea. 12/05/2020 upon evaluation today patient's wound bed actually showed signs of good granulation epithelization at this point. There does not appear to be any signs of infection which is great news and overall very pleased with where things stand. Overall the patient seems to be doing fairly well in my opinion with regard to her wound although I do believe  she continues to build up a lot  of biofilm I think she could benefit from using PuraPly at this point. 12/12/2020 upon evaluation today patient's wound actually appears to be doing decently well today. The Unna boot has not been quite as well-tolerated so that more uncomfortable for her and even causing some pressure over the plate on the lateral portion of her foot which is 90 where the wound is. There did not appear to be any significant deep tissue injury with that there may be a minimal change in the skin noted I think that we may want to go back to the Coflex 2 layer which is a little bit easier on her skin it seems. 12/19/2020 upon evaluation today patient actually seems to be making great progress with the PuraPly currently. She in fact seems to be much better as far as the overall appearance of the wound bed is concerned I am very happy in this regard. I do not see any signs of of infection which is great news as well. No fevers, chills, nausea, vomiting, or diarrhea. 12/26/2020 upon evaluation today patient appears to actually be doing better in regard to her wound on the left medial ankle region. The surface of the wound is actually doing significantly better which is great news. There does not appear to be any signs of infection which is also great news and in general I am extremely pleased with where we stand today. 01/02/2021 upon evaluation today patient appears to be doing well with regard to her wound. In fact this is showing signs of excellent improvement and very pleased with where things stand. In fact the last 3 appointments have all shown signs of this getting smaller which is excellent news. I have not even had to perform any debridement and today is no exception. Overall I feel like this is dramatically improved compared to previous. T oday is PuraPly application #4. 06/26/5807 upon evaluation today patient appears to be doing excellent in regard to her wound this is continue to show  signs of improvement and overall I am extremely pleased with where we stand today. She is actually here for PuraPly application #5. Every time we have applied this she is noted definite improvement on measurements. 01/23/2021 upon evaluation today patient is actually making good progress in regard to her wound. This was actually on just a little bit longer this time compared to previous due to the fact that she did have to go out of town. She is actually here for PuraPly application #6. We have definitely been seeing improvements in the overall quality of the tissue on the surface of the wound which is awesome news. In general I think that the patient seems to be continuing to make great progress here. 01/30/2021 upon evaluation today patient's wound is actually doing excellent. There is really not any significant biofilm buildup which is great news and overall I am extremely pleased with where things stand today. There does not appear to be any signs of active infection. No fevers, chills, nausea, vomiting, or diarrhea. 02/06/2021 upon evaluation today patient's wound is actually showing signs of excellent improvement which is great news. We continue to see the benefit of the PuraPly this is doing a great job the wound seems not really irritated whatsoever and is showing signs of good granulation at this point. Overall I am extremely happy with what we are seeing. The patient likewise is happy to hear all of this as well. 02/13/2021 upon evaluation today patient appears to be doing well with regard to  her wound. This again is measuring smaller today and I am very pleased in this regard. Fortunately there does not appear to be any signs of active infection at this time which is good news from a systemic standpoint. Locally there is still some significant drainage which she does have is concerned about infection locally. No fevers, chills, nausea, vomiting, or diarrhea. 02/20/2021 upon evaluation today  patient actually appears to be doing decently well in regard to her wound. She has been tolerating the dressing changes without complication. I do believe the PuraPly is helping wound bed does appear to be doing better. There is no evidence of active infection locally or systemically at this point visually although on fluorescence imaging there still appear to be bacterial load and bioburden noted. 02/27/2021 upon evaluation today patient fortunately continues to show signs of improvement with use of the PuraPly currently. Subsequently we did review her culture results and to be honest I think that the Bactrim is probably the best option to have her continue at this point. For that reason I am get a go ahead and send in a refill today for her for an additional 2 weeks. Nonetheless I think that we are making excellent progress here. It was Enterococcus and Staphylococcus that were noted she is allergic to penicillin so there is not much I can do from the Enterococcus standpoint though the staff does seem to be sensitive to the Bactrim. 03/06/2021 upon evaluation today patient appears to be doing well with regard to her wound. This is definitely showing signs of improvement which is great news and overall I am extremely pleased with where things stand today. There does not appear to be any signs of active infection which is great news and overall and I do believe that we are headed in the appropriate direction I think the PuraPly is doing an awesome job for her. 03/20/2021 upon evaluation today patient actually appears to be doing well with regard to her wound. We have been using the PuraPly although last week when I was out of the office they actually just used endoform nonetheless this still seems to be doing great. Fortunately there does not appear to be any signs of active infection which is great news overall I think the patient is making wonderful progress. 03/27/2021 upon evaluation today patient  appears to be doing well with regard to her wound in fact there is a little drainage but otherwise her does not appear to be any major signs of open wounds nor infection at this time. Overall I am extremely pleased with where things stand. No fevers, chills, nausea, vomiting, or diarrhea. 04/03/2021 upon evaluation today patient presents for reevaluation here in the clinic and overall she seems to be doing quite well. Fortunately there does not appear to be any signs of active infection which is great news and in general I am very pleased with how things appear. There is still a very small opening remaining but in general she is looking much better. Fortunately I do not think there is any need for the PuraPly at this point. 04/10/2021 upon evaluation today patient's wound is actually showing signs of excellent improvement. I am actually very pleased with where it stands today. I think she is very close to complete closure in fact this may be healed today. Either way I think that she may benefit from using her own compression socks for the next week and then will subsequently see how things stand following. 04/17/2021 upon evaluation today  patient appears to be doing well with regard to her wound on the ankle region. This is actually showing signs of excellent improvement which is great news. I do not see any signs of active infection at this time which is great and no open wound. With that being said on the lateral portion of her ankle right at the base of her plate she actually has an area of pressure that occurred over the past week when she was not in the wrap or we been padding her. This does seem to be an issue which I think is good to be an ongoing problem I am can actually get in touch with Dr. [name] today to see what we can do in that regard. 04/24/2021 upon evaluation today patient appears to be doing well with regard to her ankle region for the most part although there does appear to be some  fluid trapped underneath the area where we thought this was healed last week. Unfortunately I think at this time that we can have to reopen and clean away some of this area and then subsequently depending on how things do over the next week or so we will see about discharge but right now I do not think that is the right way to go. She voiced understanding. 05/01/2021 upon evaluation today patient's wound still appears to be open unfortunately. I do believe that we do need to see what we can do about trying to get this closed I believe compression wrapping is probably can to be indicated here. Fortunately there is no signs of active infection at this time. 05/08/2021 upon evaluation today patient appears to be doing okay in regard to her ulcer this is very superficial and we will see any major issues here. The biggest problem currently that I find is that we just cannot get this area to completely seal up. She seemed to almost be and then things digressed a little bit. Right now is not too bad but we do need to see if we can make some difference here. I think maybe switching over to collagen may be better. 05/15/2021 upon evaluation today patient actually appears to be doing pretty well in regard to her wound. This is measuring a little smaller and looking much better. She did see orthopedics today there to me making her a insert for her shoe to try to help straighten out her gait. I think this can be beneficial for her as well. 05/22/2021 upon evaluation today patient appears to be doing well currently in regard to her wound although is not significantly smaller unfortunately this at least does not show any signs of active infection at this time which is great news. No fevers, chills, nausea, vomiting, or diarrhea. I am going to see about switching over to endoform that is what we wanted to use in the past always been on backorder. 06/05/2021 upon evaluation today patient appears to be doing somewhat  poorly in regard to her wound. This is really not showing the signs of improvement that I would have like to have seen. Fortunately I do not see any signs of active infection locally nor systemically which is great news. With that being said even though I do not see the sign she is having some increased discomfort which is not good. She is also having some issues here with the wound looking a little larger today compared to where its been. With that being said overall I believe that we probably need to consider at  least that there may be a subacute infection here and that is what is going on. We have tried multiple different medications and dressings which have done well for her in the past without seeing any signs of improvement. She is in agreement with addressing this as such. 06/12/2020 upon evaluation today patient's wound is actually showing some signs of improvement today. In fact I do not even see any need for sharp debridement based on what I see at this point today. Fortunately there is no evidence of active infection currently which is great news. No fevers, chills, nausea, vomiting, or diarrhea. Electronic Signature(s) Signed: 06/12/2021 12:33:54 PM By: [name] PA-C Entered By: [name] on 06/12/2021 12:33:54 -------------------------------------------------------------------------------- Physical Exam Details Patient Name: Date of Service: [facility] [name], [name] 06/12/2021 10:00 A M Medical Record Number: [id number] Patient Account Number: [id number] Date of Birth/Sex: Treating RN: 1955/06/24 (66 y.o. [name] Provider: [name] Other Clinician: Referring Provider: Treating Provider/Extender: [name] in Treatment: 61 Constitutional Well-nourished and well-hydrated in no acute distress. Respiratory normal breathing without difficulty. Psychiatric this patient is able to make decisions and demonstrates good  insight into disease process. Alert and Oriented x 3. pleasant and cooperative. Notes Upon inspection patient's wound bed showed signs of good granulation epithelization at this point. Overall I do believe that she is actually making fairly good progress here based on what I see and I think that the antibiotic has been beneficial from last week to this week. My suggestion is to continue with the current wound care measures since the culture did show results that were intermediate with the Cipro can have her actually switch over to the Bactrim which she is done well with in the past. This should help take care of the situation and we will do that for an additional 2 weeks to ensure this completely clears. Electronic Signature(s) Signed: 06/12/2021 12:34:46 PM By: [name] PA-C Entered By: [name] on 06/12/2021 12:34:46 -------------------------------------------------------------------------------- Physician Orders Details Patient Name: Date of Service: [name], [name] 06/12/2021 10:00 Yoe Record Number: [id number] Patient Account Number: [id number] Date of Birth/Sex: Treating RN: [birth year] (66 y.o. 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 Margarita Grizzle Return A Bathing/ Shower/ Hygiene May shower with protection but do not get wound dressing(s) wet. Edema Control - Lymphedema / SCD / Other Bilateral Lower Extremities Elevate legs to the level  of the heart or above for 30 minutes daily and/or when sitting, a frequency of: - throughout the day when sitting Avoid standing for long periods of time. Patient to wear own compression stockings every day. Exercise regularly Moisturize legs daily. Non Wound Condition pply the following to affected area as directed: - Apply silicone bordered foam to left lateral foot for protection A Wound Treatment Wound #15R - Malleolus Wound Laterality: Left, Medial Cleanser: Wound Cleanser (Generic) 1 x Per Day/15 Days Discharge Instructions: Cleanse the wound with wound cleanser prior to applying a clean dressing using gauze sponges, not tissue or  cotton balls. Peri-Wound Care: Sween Lotion (Moisturizing lotion) 1 x Per Day/15 Days Discharge Instructions: Apply moisturizing lotion as directed Prim Dressing: Hydrofera Blue Ready Foam, 2.5 x2.5 in 1 x Per Day/15 Days ary Discharge Instructions: Apply to wound bed as instructed Secondary Dressing: Woven Gauze Sponge, Non-Sterile 4x4 in (Generic) 1 x Per Day/15 Days Discharge Instructions: Apply over primary dressing as directed. Compression Wrap: CoFlex TLC XL 2-layer Compression System 4x7 (in/yd) 1 x Per Day/15 Days Discharge Instructions: Apply CoFlex 2-layer compression as directed. (alt for 4 layer) Patient Medications llergies: penicillin, doxycycline A Notifications Medication Indication Start End 06/12/2021 Bactrim DS DOSE 1 - oral 800 mg-160 mg tablet - 1 tablet oral taken 2 times per day for 14 days Electronic Signature(s) Signed: 06/12/2021 12:36:06 PM By: [name] PA-C Entered By: [name] on 06/12/2021 12:36:04 -------------------------------------------------------------------------------- Problem List Details Patient Name: Date of Service: [name], [name] 06/12/2021 10:00 A M Medical Record Number: [id number] Patient Account Number: [id number] Date of Birth/Sex: Treating RN: 09/24/1955 (66 y.o. 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 06/12/2021 10:00 A M Medical Record Number: [id number] Patient Account Number: [id number] Date of Birth/Sex: Treating RN: 01/20/1956 (67 y.o. [name] Provider: [name] Other Clinician: Referring Provider: Treating Provider/Extender: [name] in Treatment: 33 Subjective Chief Complaint Information obtained from Patient patient is been followed long-term in this clinic for venous insufficiency ulcers with inflammation, hypertension and ulceration over the medial ankle bilaterally. 01/17/2020; this is a patient who is here for review of postoperative wounds on the left lateral ankle and recurrence of venous stasis ulceration on the left medial History of Present Illness (HPI) the remaining wound is over the left medial  ankle. Similar wound over the right medial ankle healed largely with use of Apligraf. Most recently we have been using Hydrofera Blue over this wound with considerable improvement. The patient has been extensively worked up in the past for her venous insufficiency and she is not a candidate for antireflux surgery although I have none of the details available currently. 08/24/14; considerable improvement today. About 50% of this wound areas now epithelialized. The base of the wound appears to be healthier granulation.as opposed to last week when she had deteriorated a considerable improvement 08/17/14; unfortunately the wound has regressed somewhat. The areas of epithelialization from the superior aspect are not nearly as healthy as they were last week. The patient thinks her Hydrofera Blue slipped. 09/07/14; unfortunately the area has markedly regressed in the 2 weeks since I've seen this. There is an odor surrounding erythema. The healthy granulation tissue that we had at the base of  the wound now is a dusky color. The nurse reports green drainage 09/14/14; the area looks somewhat better than last week. There is less erythema and less drainage. The culture I did did not show any growth. Nevertheless I think it is better to continue the Cipro and doxycycline for a further week. The remaining wound area was debridement. 09/21/14. Wound did not require debridement last week. Still less erythema and less drainage. She can complete her antibiotics. The areas of epithelialization in the superior aspect of the wound do not look as healthy as they did some weeks ago 10/05/14 continued improvement in the condition of this wound. There is advancing epithelialization. Less aggressive debridement required 10/19/14 continued improvement in the condition and volume of this wound. Less aggressive debridement to the inferior part of this to remove surface slough and fibrinous eschar 11/02/14 no debridement is required. The  surface granulation appears healthy although some of her islands of epithelialization seem to have regressed. No evidence of infection 11/16/14; lites surface debridement done of surface eschar. The wound does not look to be unhealthy. No evidence of infection. Unfortunately the patient has had podiatry issues in the right foot and for some reason has redeveloped small surface ulcerations in the medial right ankle. Her original presentation involved wounds in this area 11/23/14 no debridement. The area on the right ankle has enlarged. The left ankle wound appears stable in terms of the surface although there is periwound inflammation. There has been regression in the amount of new skin 11/30/14 no debridement. Both wound areas appear healthy. There was no evidence of infection. The the new area on the right medial ankle has enlarged although that both the surfaces appear to be stable. 12/07/14; Debridement of the right medial ankle wound. No no debridement was done on the left. 12/14/14 no major change in and now bilateral medial ankle wounds. Both of these are very painful but the no overt evidence of infection. She has had previous venous ablation 12/21/14; patient states that her right medial ankle wound is considerably more painful last week than usual. Her left is also somewhat painful. She could not tolerate debridement. The right medial ankle wound has fibrinous surface eschar 12/28/14 this is a patient with severe bilateral venous insufficiency ulcers. For a considerable period of time we actually had the one on the right medial ankle healed however this recently opened up again in June. The left medial ankle wound has been a refractory area with some absent flows. We had some success with Hydrofera Blue on this area and it literally closed by 50% however it is recently opened up Foley. Both of these were debridement today of surface eschar. She tolerates this poorly 01/25/15: No change in the  status of this. Thick adherent escar. Very poor tolerance of any attempt at debridement. I had healed the right medial malleolus wound for a considerable amount of time and had the left one down to about 50% of the volume although this is totally regressed over the last 48 weeks. Further the right leg has reopened. she is trying to make a appointment with pain and vascular, previous ablations with Dr. Aleda Grana. I do not believe there is an arterial insufficiency issue here 02/01/15 the status of the adherent eschar bilaterally is actually improved. No debridement was done. She did not manage to get vascular studies done 02/08/15 continued debridement of the area was done today. The slough is less adherent and comes off with less pressure. There is no surrounding  infection peripheral pulses are intact 02/15/15 selective debridement with a disposable curette. Again the slough is less adherent and comes off with less difficulty. No surrounding infection peripheral pulses are intact. 02/22/15 selective debridement of the right medial ankle wound. Slough comes off with less difficulty. No obvious surrounding infection peripheral pulses are intact I did not debridement the one on the left. Both of these are stable to improved 03/01/15 selective debridement of both wound areas using a curette to. Adherent slough cup soft with less difficulty. No obvious surrounding infection. The patient tells me that 2 days ago she noted a rash above the right leg wrap. She did not have this on her lower legs when she change this over she arrives with widespread left greater than right almost folliculitis-looking rash which is extremely pruritic. I don't see anything to culture here. There is no rash on the rest of her body. She feels well systemically. 03/08/15; selective debridement of both wounds using a curette. Base of this does not look unhealthy. She had limegreen drainage coming out of the left leg wound and describes  a lot of drainage. The rash on her left leg looks improved to. No cultures were done. 03/22/15; patient was not here last week. Basal wounds does not look healthy and there is no surrounding erythema. No drainage. There is still a rash on the left leg that almost looks vasculitic however it is clearly limited to the top of where the wrap would be. 04/05/15; on the right required a surgical debridement of surface eschar and necrotic subcutaneous tissue. I did not debridement the area on the left. These continue to be large open wounds that are not changing that much. We were successful at one point in healing the area on the right, and at the same time the area on the left was roughly half the size of current measurements. I think a lot of the deterioration has to do with the prolonged time the patient is on her feet at work 04/19/15 I attempted-like surface debridement bilaterally she does not tolerate this. She tells me that she was in allergic care yesterday with extreme pain over her left lateral malleolus/ankle and was told that she has an "sprain" 05/03/15; large bilateral venous insufficiency wounds over the medial malleolus/medial aspect of her ankles. She complains of copious amounts of drainage and his usual large amounts of pain. There is some increasing erythema around the wound on the right extending into the medial aspect of her foot to. historically she came in with these wounds the right one healed and the left one came down to roughly half its current size however the right one is reopened and the left is expanded. 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 05/10/15 large bilateral venous insufficiency wounds. There is less adherence surface left however the surface culture that I did last week grew pseudomonas therefore bilateral selective debridement score necessary. There is surrounding erythema. The patient describes severe bilateral  drainage and a lot of pain in the left ankle. Apparently her podiatrist was were ready to do a cortisone shot 05/17/15; the patient complains of pain and again copious amounts of drainage. 05/24/15; we used Iodo flex last week. Patient notes considerable improvement in wound drainage. Only needed to change this once. 05/31/15; we continued Iodoflex; the base of these large wounds bilaterally is not too bad but there is probably likely a significant bioburden here.  I would like to debridement just doesn't tolerate it. 06/06/14 I would like to continue the Iodoflex although she still hasn't managed to obtain supplies. She has bilateral medial malleoli or large wounds which are mostly superficial. Both of them are covered circumferentially with some nonviable fibrinous slough although she tolerates debridement very poorly. She apparently has an appointment for an ablation on the right leg by interventional radiology. 06/14/15; the patient arrives with the wounds and static condition. We attempted a debridement although she does not do well with this secondary to pain. I 07/05/15; wounds are not much smaller however there appears to be a cleaner granulating base. The left has tight fibrinous slough greater than the right. Debridement is tolerated poorly due to pain. Iodoflex is done more for these wounds in any of the multitude of different dressings I have tried on the left 1 and then subsequently the right. 07/12/15; no change in the condition of this wound. I am able to do an aggressive debridement on the right but not the left. She simply cannot tolerate it. We have been using Iodoflex which helps somewhat. It is worthwhile remembering that at one point we healed the right medial ankle wound and the left was about 25% of the current circumference. We have suggested returning to vascular surgery for review of possible further ablations for one reason or another she has not been able to do this. 07/26/15 no  major change in the condition of either wound on her medial ankle. I did not attempt to debridement of these. She has been aggressively scrubbing these while she is in the shower at home. She has her supply of Iodoflex which seems to have done more for these wounds then anything I have put on recently. 08/09/15 wound area appears larger although not verified by measurements. Using Iodoflex 09/05/2015 -- she was here for avisit today but had significant problems with the wound and I was asked to see her for a physician opinion. I have summarize that this lady has had surgery on her left lower extremity about 10 years ago where the possible veins stripping was done. She has had an opinion from interventional radiology around November 2016 where no further sclerotherapy was ordered. The patient works 12 hours a day and stands on a concrete floor with work boots and is unable to get the proper compression she requires and cannot elevate her limbs appropriately at any given time. She has recently grown Pseudomonas from her wound culture but has not started her ciprofloxacin which was called in for her. 09/13/15 this continues to be a difficult situation for this patient. At one point I had this wound down to a 1.5 x 1.5" wound on her left leg. This is deteriorated and the right leg has reopened. She now has substantial wounds on her medial calcaneus, malleoli and into her lower leg. One on the left has surface eschar but these are far too painful for me to debridement here. She has a vascular surgery appointment next week to see if anything can be done to help here. I think she has had previous ablations several years ago at [facility]. She has no major edema. She tells me that she did not get product last time [facility]] and went for several days without it. She continues to work in work boots 12 hours a day. She cannot get compression/4-layer under her work boots. 09/20/15 no major change. Periwound  edema control was not very good. Her point with pain  and vascular is next Wednesday the 25th 09/28/15; the patient is seen vascular surgery and is apparently scheduled for repeat duplex ultrasounds of her bilateral lower legs next week. 10/05/15; the patient was seen by Dr. Doren Custard of vascular surgery. He feels that she should have arterial insufficiency excluded as cause/contributed to her nonhealing stage she is therefore booked for an arteriogram. She has apparently monophasic signals in the dorsalis pedis pulses. She also of course has known severe chronic venous insufficiency with previous procedures as noted previously. I had another long discussion with the patient today about her continuing to work 12 hour shifts. I've written her out for 2 months area had concerns about this as her work location is currently undergoing significant turmoil and this may lead to her termination. She is aware of this however I agree with her that she simply cannot continue to stand for 12 hours multiple days a week with the substantial wound areas she has. 10/19/15; the Dr. Doren Custard appointment was largely for an arteriogram which was normal. She does not have an arterial issue. He didn't make a comment about her chronic venous insufficiency for which she has had previous ablations. Presumably it was not felt that anything additional could be done. The patient is now out of work as I prescribed 2 weeks ago. Her wounds look somewhat less aggravated presumably because of this. I felt I would give debridement another try today 10/25/15; no major change in this patient's wounds. We are struggling to get her product that she can afford into her own home through her insurance. 11/01/15; no major change in the patient's wounds. I have been using silver alginate as the most affordable product. I spoke to Dr. Marla Roe last week with her requested take her to the OR for surgical debridement and placement of ACEL. Dr. Marla Roe told  me that she would be willing to do this however [facility] will not cover this, fortunately the patient has [place] healthcare of some variant 11/08/15; no major change in the patient's wounds. She has been completely nonviable surface that this but is in too much pain with any attempted debridement are clinic. I have arranged for her to see Dr. Marla Roe ham of plastic surgery and this appointment is on Monday. I am hopeful that they will take her to the OR for debridement, possible ACEL ultimately possible skin graft 11/22/15 no major change in the patient's wounds over her bilateral medial calcaneus medial malleolus into the lower legs. Surface on these does not look too bad however on the left there is surrounding erythema and tenderness. This may be cellulitis or could him sleepy tinea. 11/29/15; no major changes in the patient's wounds over her bilateral medial malleolus. There is no infection here and I don't think any additional antibiotics are necessary. There is now plan to move forward. She sees Dr. Marla Roe in a week's time for preparation for operative debridement and ACEL placement I believe on 7/12. She then has a follow-up appointment with Dr. Marla Roe on 7/21 12/28/15; the patient returns today having been taken to the [facility] by Dr. Marla Roe 12/12/15 she underwent debridement, intraoperative cultures [which were negative]. She had placement of a wound VAC. Parent really ACEL was not available to be placed. The wound VAC foam apparently adhered to the wound since then she's been using silver alginate, Xeroform under Ace wraps. She still says there is a lot of drainage and a lot of pain 01/31/16; this is a patient I see monthly.  I had referred her to Dr. Marla Roe him of plastic surgery for large wounds on her bilateral medial ankles. She has been to the OR twice once in early July and once in early August. She tells me over the last 3 weeks she has been using the wound VAC with  ACEL underneath it. On the right we've simply been using silver alginate. Under Kerlix Coban wraps. 02/28/16; this is a patient I'm currently seeing monthly. She is gone on to have a skin graft over her large venous insufficiency ulcer on the left medial ankle. This was done by Dr. Marla Roe him. The patient is a bit perturbed about why she didn't have one on her right medial ankle wound. She has been using silver alginate to this. 03/06/16; I received a phone call from her plastic surgery Dr. Marla Roe. She expressed some concern about the viability of the skin graft she did on the left medial ankle wound. Asked me to place Endoform on this. She told me she is not planning to do a subsequent skin graft on the right as the left one did not take very well. I had placed Hydrofera Blue on the right 03/13/16; continue to have a reasonably healthy wound on the right medial ankle. Down to 3 mm in terms of size. There is epithelialization here. The area on the left medial ankle is her skin graft site. I suppose the last week this looks somewhat better. She has an open area inferiorly however in the center there appears to be some viable tissue. There is a lot of surface callus and eschar that will eventually need to come off however none of this looked to be infected. Patient states that the is able to keep the dressing on for several days which is an improvement. 03/20/16 no major change in the circumference of either wound however on the left side the patient was at Dr. Eusebio Friendly office and they did a debridement of left wound. 50% of the wound seems to be epithelialized. I been using Endoform on the left Hydrofera Blue in the right 03/27/16; she arrives today with her wound is not looking as healthy as they did last week. The area on the right clearly has an adherent surface to this a very similar surface on the left. Unfortunately for this patient this is all too familiar problem. Clearly the Endoform  is not working and will need to change that today that has some potential to help this surface. She does not tolerate debridement in this clinic very well. She is changing the dressing wants 04/03/16; patient arrives with the wounds looking somewhat better especially on the right. Dr. Migdalia Dk change the dressing to silver alginate when she saw her on Monday and also sold her some compression socks. The usefulness of the latter is really not clear and woman with severely draining wounds. 04/10/16; the patient is doing a bit of an experiment wearing the compression stockings that Dr. Migdalia Dk provided her to her left leg and the out of legs based dressings that we provided to the right. 05/01/16; the patient is continuing to wear compression stockings Dr. Migdalia Dk provided her on the left that are apparently silver impregnated. She has been using Iodoflex to the right leg wound. Still a moderate amount of drainage, when she leaves here the wraps only last for 4 days. She has to change the stocking on the left leg every night 05/15/16; she is now using compression stockings bilaterally provided by Dr. Marla Roe. She is wearing  a nonadherent layer over the wounds so really I don't think there is anything specific being done to this now. She has some reduction on the left wound. The right is stable. I think all healing here is being done without a specific dressing 06/09/16; patient arrives here today with not much change in the wound certainly in diameter to large circular wounds over the medial aspect of her ankle bilaterally. Under the light of these services are certainly not viable for healing. There is no evidence of surrounding infection. She is wearing compression stockings with some sort of silver impregnation as prescribed by Dr. Marla Roe. She has a follow-up with her tomorrow. 06/30/16; no major change in the size or condition of her wounds. These are still probably covered with a nonviable surface.  She is using only her purchase stockings. She did see Dr. Marla Roe who seemed to want to apply Dakin's solution to this I'm not extreme short what value this would be. I would suggest Iodoflex which she still has at home. 07/28/16; I follow Mrs. Lybeck episodically along with Dr. Marla Roe. 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 She has been applying a topical collagen ointment to both wounds with Adaptic. I don't think Dr. Marla Roe is planning to take her back to the OR. 08/19/16; I follow Mrs. Jeneen Rinks on a monthly basis along with Dr. Marla Roe of plastic surgery. 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 I been following her for a number of years. At one point I was able to get the right medial malleolus wound to heal and had the left medial malleolus down to about half its current size however and I had to send her to plastic surgery for an operative debridement. Since then things have been stable to slightly improve the area on the right is slightly better one in the left about the same although there is much less adherent surface than I'm used to with this patient. She is using some form of liquid collagen gel that Dr. Marla Roe provided a Kerlix cover with the patient's own pressure stockings. She tells me that she has extreme pain in both ankles and along the lateral aspect of both feet. She has been unable to work for some period of time. She is telling me she is retiring at the beginning of April. She sees Dr. Doran Durand of orthopedics next week 09/22/16; patient has not seen Dr. Marla Roe since the last time she is here. I'm not really sure what she is using to the wounds other than bits and pieces of think she had left over including most recently Hydrofera Blue. She is using juxtalite stockings. She is having difficulty with her husband's recent  illness "stroke". She is having to transport him to various doctors appointments. Dr. Marla Roe left her the option of a repeat debridement with ACEL however she has not been able to get the time to follow-up on this. She continues to have a fair amount of drainage out of these wounds with certainly precludes leaving dressings on all week 10/13/16; patient has not seen Dr. Marla Roe since she was last in our clinic. I'm not really sure what she is doing with the wounds, we did try to get her [facility] and I think she is actually using this most of the time. Because of drainage she states she has to change this every second day although this is an improvement  from what she used to do. She went to see Dr. Doran Durand who did not think she had a muscular issue with regards to her feet, he referred her to a neurologist and I think the appointment is sometime in June. I changed her back to Iodoflex which she has used in the past but not recently. 11/03/16; the patient has been using Iodoflex although she ran out of this. Still claims that there is a lot of drainage although the wound does not look like this. No surrounding erythema. She has not been back to see Dr. Marla Roe 11/24/16; the patient has been using Iodoflex again but she ran out of it 2 or 3 days ago. There is no major change in the condition of either one of these wounds in fact they are larger and covered in a thick adherent surface slough/nonviable tissue especially on the left. She does not tolerate mechanical debridement in our clinic. Going back to see Dr. Marla Roe of plastic surgery for an operative debridement would seem reasonable. 12/15/16; the patient has not been back to see Dr. Marla Roe. She is been dealing with a series of illnesses and her husband which of monopolized her time. She is been using Sorbact which we largely supplied. She states the drainage is bad enough that it maximum she can go 2-3 days without changing the  dressing 01/12/2017 -- the patient has not been back for about 4 weeks and has not seen Dr. Marla Roe not does she have any appointment pending. 01/23/17; patient has not seen Dr. Marla Roe even though I suggested this previously. She is using Santyl that was suggested last week by Dr. Con Memos this Cost her $16 through her insurance which is indeed surprising 02/12/17; continuing Santyl and the patient is changing this daily. A lot of drainage. She has not been back to see plastic surgery she is using an Ace wrap. Our intake nurse suggested wrap around stockings which would make a good reasonable alternative 02/26/17; patient is been using Santyl and changing this daily due to drainage. She has not been to see plastic surgery she uses in April Ace wrap to control the edema. She did obtain extremitease stockings but stated that the edema in her leg was to big for these 03/20/17; patient is using Santyl and Anasept. Surfaces looked better today the area on the right is actually measuring a little smaller. She has states she has a lot of pain in her feet and ankles and is asking for a consult to pain control which I'll try to help her with through our case manager. 04/10/17; the patient arrives with better-looking wound surfaces and is slightly smaller wound on the left she is using a combination of Santyl and Anasept. She has an appointment or at least as started in the pain control center associated with [facility] 05/14/17; this is a patient who I followed for a prolonged period of time. She has venous insufficiency ulcers on her bilateral medial ankles. At one point I had this down to a much smaller wound on the left however these reopened and we've never been able to get these to heal. She has been using Santyl and Anasept gel although 2 weeks ago she ran out of the Anasept gel. She has a stable appearance of the wound. She is going to the wound care clinic at [facility]. They wanted do a  nerve block/spinal block although she tells me she is reluctant to go forward with that. 05/21/17; this is a patient I  have followed for many years. She has venous insufficiency ulcers on her bilateral medial ankles. Chronic pain and deformity in her ankles as well. She is been to see plastic surgery as well as orthopedics. Using PolyMem AG most recently/Kerramax/ABDs and 2 layer compression. She has managed to keep this on and she is coming in for a nurse check to change the dressing on Tuesdays, we see her on Fridays 06/05/17; really quite a good looking surface and the area especially on the right medial has contracted in terms of dimensions. Well granulated healthy-looking tissue on both sides. Even with an open curet there is nothing that even feels abnormal here. This is as good as I've seen this in quite some time. We have been using PolyMem AG and bringing her in for a nurse check 06/12/17; really quite good surface on both of these wounds. The right medial has contracted a bit left is not. We've been using PolyMem and AG and she is coming in for a nurse visit 06/19/17; we have been using PolyMem AG and bringing her in for a nurse check. Dimensions of her wounds are not better but the surfaces looked better bilaterally. She complained of bleeding last night and the left wound and increasing pain bilaterally. She states her wound pain is more neuropathic than just the wounds. There was some suggestion that this was radicular from her pain management doctor in talking to her it is really difficult to sort this out. 06/26/17; using PolyMem and AG and bringing her in for a nurse check as All of this and reasonably stable condition. Certainly not improved. The dimensions on the lateral part of the right leg look better but not really measuring better. The medial aspect on the left is about the same. 07/03/16; we have been using PolyMen AG and bringing her in for a nurse check to change the dressings as the  wounds have drainage which precludes once weekly changing. We are using all secondary absorptive dressings.our intake nurse is brought up the idea of using a wound VAC/snap VAC on the wound to help with the drainage to see if this would result in some contraction. This is not a bad idea. The area on the right medial is actually looking smaller. Both wounds have a reasonable-looking surface. There is no evidence of cellulitis. The edema is well controlled 07/10/17; the patient was denied for a snap VAC by her insurance. The major issue with these wounds continues to be drainage. We are using wicked PolyMem AG and she is coming in for a nurse visit to change this. The wounds are stable to slightly improved. The surface looks vibrant and the area on the right certainly has shrunk in size but very slowly 07/17/17; the patient still has large wounds on her bilateral medial malleoli. Surface of both of these wounds looks better. The dimensions seem to come and go but no consistent improvement. There is no epithelialization. We do not have options for advanced treatment products due to insurance issues. They did not approve of the wound VAC to help control the drainage. More recently we've been using PolyMem and AG wicked to allow drainage through. We have been bringing her in for a nurse visit to change this. We do not have a lot of options for wound care products and the home again due to insurance issues 07/24/17; the patient's wound actually looks somewhat better today. No drainage measurements are smaller still healthy-looking surface. We used silver collagen under PolyMen started last  week. We have been bringing her in for a dressing change 07/31/17; patient's wound surface continued to look better and I think there is visible change in the dimensions of the wound on the right. Rims of epithelialization. We have been using silver collagen under PolyMen and bringing her in for a dressing change. There appears  to be less drainage although she is still in need of the dressing change 08/07/17. Patient's wound surface continues to look better on both sides and the area on the right is definitely smaller. We have been using silver collagen and PolyMen. She feels that the drainage has been it has been better. I asked her about her vascular status. She went to see Dr. Aleda Grana at [facility] and had some form of ablation. I don't have much detail on this. I haven't my notes from 2016 that she was not a candidate for any further ablation but I don't have any more information on this. We had referred her to vein and vascular I don't think she ever went. He does not have a history of PAD although I don't have any information on this either. We don't even have ABIs in our record 08/14/17; we've been using silver collagen and PolyMen cover. And putting the patient and compression. She we are bringing her in as a nurse visit to change this because ofarge amount of drainage. We didn't the ABIs in clinic today since they had been done in many moons 1.2 bilaterally. She has been to see vein and vascular however this was at [facility] and she had ablation although I really don't have any information on this all seemed biking get a report. She is also been operatively debrided by plastic surgery and had a cell placed probably 8-12 months ago. This didn't have a major effect. We've been making some gains with current dressings 08/19/17-She is here in follow-up evaluation for bilateral medial malleoli ulcers. She continues to tolerate debridement very poorly. We will continue with recently changed topical treatment; if no significant improvement may consider switching to Iodosorb/Iodoflex. 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 She has been using silver collagen and PolyMem. I believe she has been used and tried on Iodoflex before. During her trip to the clinic we've  been watching her wound with Anasept spray and I would like to encourage this on thenurse visit days 09/04/17 bilateral medial malleoli ulcers area is her chronic related to chronic venous insufficiency. These have been very refractory over time. We have been using silver collagen and PolyMen. She is coming in once a week for a doctor's and once a week for nurse visits. We are actually making some progress 09/18/17; the patient's wounds are smaller especially on the right medial. She arrives today to upset to consider even washing these off with Anasept which I think is been part of the reason this is been closing. We've been using collagen covered in PolyMen otherwise. It is noted that she has a small area of folliculitis on the right medial calf that. As we are wrapping her legs I'll give her a short course of doxycycline to make sure this doesn't amount to anything. She is a long list of complaints today including imbalance, shortness of breath on exertion, inversion of her left ankle. With regards to the latter complaints she is been to see orthopedics and they offered her a tendon release surgery I believe but wanted her wounds to be closed first. I have recommended  she go see her primary physician with regards to everything else. 09/25/17; patient's wounds are about the same size. We have made some progress bilaterally although not in recent weeks. She will not allow me T wash these wounds with Anasept even if she is doing her cell. Wheeze we've been using collagen covered in PolyMen. Last week she had a small area of folliculitis this is now opened into a small wound. She completed 5 days of trimethoprim sulfamethoxazole 10/02/17; unfortunately the area on her left medial ankle is worse with a larger wound area towards the Achilles. The patient complains of a lot of pain. She will not allow debridement although visually I don't think there is anything to debridement in any case. We have been using  silver collagen and PolyMen for several months now. Initially we are making some progress although I'm not really seeing that today. We will move back to [facility]. His admittedly this is a bit of a repeat however I'm hoping that his situation is different now. The patient tells me she had her leg on the left give out on her yesterday this is process some pain. 10/09/17; the patient is seen twice a week largely because of drainage issues coming out of the chronic medial bimalleolar wounds that are chronic. Last week the dimensions of the one on the left looks a little larger I changed her to [facility]. She comes in today with a history of terrible pain in the bilateral wound areas. She will not allow debridement. She will not even allow a tissue culture. There is no surrounding erythema no no evidence of cellulitis. We have been putting her Kerlix Coban man. She will not allow more aggressive compression as there was a suggestion to put her in 3 layer wraps. 10/16/17; large wounds on her bilateral medial malleoli. These are chronic. Not much change from last week. The surface looks have healthy but absolutely no epithelialization. A lot of pain little less so of drainage. She will not allow debridement or even washing these off in the vigorous fashion with Anasept. 10/23/17; large wounds on her bilateral malleoli which are chronic. Some improvement in terms of size perhaps on the right since last time I saw these. She states that after we increased the 3 layer compression there was some bleeding, when she came in for a nurse visit she did not want 3 layer compression put back on about our nurse managed to convince her. She has known chronic venous visit issues and I'm hoping to get her to tolerate the 3 layer compression. using Hydrofera Blue 10/30/17; absolutely no change in the condition of either wound although we've had some improvement in dimensions on the right.. Attempted to put her in  3 layer compression she didn't tolerated she is back in 2 layer compression. We've been using Hydrofera Blue We looked over her past records. She had venous reflux studies in November 2016. There was no evidence of deep venous reflux on the right. Superficial vein did not show the greater saphenous vein at think this is been previously ablated the small saphenous vein was within normal limits. The left deep venous system showed no DVT the vessels were positive for deep venous reflux in the posterior tibial veins at the ankle. The greater saphenous vein was surgically absent small saphenous vein was within normal limits. She went to vein and vascular at [place] [facility]. I believe she had an ablation on the left greater saphenous vein. I'll update her reflux  studies perhaps ever reviewed by vein and vascular. We've made absolutely no progress in these wounds. Will also try to read and TheraSkins through her insurance 11/06/17; W the patient apparently has a 2 week follow-up with vein and vascular I like him to review the whole issue with regards to her previous vascular workup by Dr. Aleda Grana. We've really made no progress on these wounds in many months. She arrives today with less viable looking surface on the left medial ankle wound. This was apparently looking about the same on Tuesday when she was here for nurse visit. 11/13/17; deep tissue culture I did last time of the left lower leg showed multiple organisms without any predominating. In particular no Staphylococcus or group A strep were isolated. We sent her for venous reflux studies. She's had a previous left greater saphenous vein stripping and I think sclerotherapy of the right greater saphenous vein. She didn't really look at the lesser saphenous vein this both wounds are on the medial aspect. She has reflux in the common femoral vein and popliteal vein and an accessory vein on the right and the common femoral vein and popliteal vein on the  left. I'm going to have her go to see vein and vascular just the look over things and see if anything else beside aggressive compression is indicated here. We have not been able to make any progress on these wounds in spite of the fact that the surface of the wounds is never look too bad. 11/20/17; no major change in the condition of the wounds. Patient reports a large amount of drainage. She has a lot of complaints of pain although enlisting her today I wonder if some of this at least his neuropathic rather than secondary to her wounds. She has an appointment with vein and vascular on 12/30/17. The refractory nature of these wounds in my mind at least need vein and vascular to look over the wounds the recent reflux studies we did and her history to see if anything further can be done here. I also note her gait is deteriorated quite a bit. 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 She has a bilateral Trendelenburg gait. I wonder if this is neuropathic or perhaps multilevel radicular. 11/27/17; her wounds actually looks slightly better. Healthy-looking granulation tissue a scant amount of epithelialization. [place] [employer] will not pay for [employer]. They will play for tri layer Oasis and Dermagraft. This is not a diabetic ulcer. We'll try for the tri layer Oasis. She still complains of some drainage. She has a vein and vascular appointment on 12/30/17 12/04/17; the wounds visually look quite good. Healthy-looking granulation with some degree of epithelialization. We are still waiting for response to our request for trial to try layer Oasis. Her appointment with vascular to review venous and arterial issues isn't sold the end of July 7/31. Not allow debridement or even vigorous cleansing of the wound surface. 12/18/17; slightly smaller especially on the right. 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 We've been using Hydrofera Blue. We still are looking into triple  layer Oasis through her insurance 01/08/18 on evaluation today patient's wound actually appears to be showing signs of good improvement at this point in time. She has been tolerating the dressing changes without complication. Fortunately there does not appear to be any evidence of infection at this point in time. We have been utilizing silver nitrate which does seem to be of benefit for her which is also good news. Overall  I'm very happy with how things seem to be both regards appearance as well as measurement. Patient did see Dr. Bridgett Larsson for evaluation on 12/30/17. In his assessment he felt that stripping would not likely add much more than chronic compression to the patient's healing process. His recommendation was to follow-up in three months with Dr. Doren Custard if she hasn't healed in order to consider referral back to you and see vascular where she previously was in a trial and was able to get her wound to heal. I'll be see what she feels she when you staying compression and he reiterated this as well. 01/13/18 on evaluation today patient appears to actually be doing very well in regard to her bilateral medial malleolus ulcers. She seems to have tolerated the chemical cauterization with silver nitrate last week she did have some pain through that evening but fortunately states that I'll be see since it seems to be doing better she is overall pleased with the progress. 01/21/18; really quite a remarkable improvement since I've last seen these wounds. We started using silver nitrate specially on the islands of hyper granulation which for some reason her around the wound circumference. This is really done quite nicely. Primary dressing Hydrofera Blue under 4 layer compression. She seems to be able to hold out without a nurse rewrap. Follow-up in 1 week 01/28/18; we've continued the hydrofera blue but continued with chemical cauterization to the wound area that we started about a month ago for irregular  hyper granulation. She is made almost stunning improvement in the overall wound dimensions. I was not really expecting this degree of improvement in these chronic wounds 02/05/18; we continue with Hydrofera Bluebut of also continued the aggressive chemical cauterization with silver nitrate. We made nice progress with the right greater than left wound. 02/12/18. We continued with Hydrofera Blue after aggressive chemical cauterization with silver nitrate. We appear to be making nice progress with both wound areas 02/19/2018; we continue with [facility] after washing the wounds vigorously with Anasept spray and chemical cauterization with silver nitrate. We are making excellent progress. The area on the right's just about closed 02/26/2018. The area on the left medial ankle had too much necrotic debris today. I used a #5 curette we are able to get most of the soft. I continued with the silver nitrate to the much smaller wound on the right medial ankle she had a new area on her right lower pretibial area which she says was due to a role in her compression 03/05/2018; both wound areas look healthy. Not much change in dimensions from last week. I continue to use silver nitrate and Hydrofera Blue. The patient saw Dr. Doren Custard of vein and vascular. He felt she had venous stasis ulcers. He felt based on her previous arteriogram she should have adequate circulation for healing. Also she has deep venous reflux but really no significant correctable superficial venous reflux at this time. He felt we should continue with conservative management including leg elevation and compression 04/02/2018; since we last saw this woman about a month ago she had a fall apparently suffered a pelvic fracture. I did not look up the x-ray. Nevertheless because of pain she literally was bedbound for 2 weeks and had home health coming out to change the dressing. Somewhat predictably this is resulted in considerable improvement in both  wound areas. The right is just about closed on the medial malleolus and the left is about half the size. 04/16/2018; both her wounds continue to go  down in size. Using Hydrofera Blue. 05/07/18; both her wounds appeared to be improving especially on the right where it is almost closed. We are using Hydrofera Blue 05/14/2018; slightly worse this week with larger wounds. Surface on the left medial not quite as good. We have been using Hydrofera Blue 05/21/18; again the wounds are slightly larger. Left medial malleolus slightly larger with eschar around the circumference. We have been using Hydrofera Blue undergoing a wraps for a prolonged period of time. This got a lot better when she was more recumbent due to a fall and a back injury. I change the primary dressing the silver alginate today. She did not tolerate a 4 layer compression previously although I may need to bring this up with her next time 05/28/2018; area on the left medial malleolus again is slightly larger with more drainage. Area on the right is roughly unchanged. She has a small area of folliculitis on the right medial just on the lower calf. This does not look ominous. 06/03/2018 left medial malleolus slightly smaller in a better looking surface. We used silver nitrate on this last time with silver alginate. The area on the right appears slightly smaller 1/10; left medial malleolus slightly smaller. Small open area on the right. We used silver nitrate and silver alginate as of 2 weeks ago. We continue with the wound and compression. These got a lot better when she was off her feet 1/17; right medial malleolus wound is smaller. The left may be slightly smaller. Both surfaces look somewhat better. 1/24; both wounds are slightly smaller. Using silver alginate under Unna boots 1/31; both wounds appear smaller in fact the area on the right medial is just about closed. Surface eschar. We have been using silver alginate under Unna boots. The  patient is less active now spends let much less time on her feet and I think this is contributed to the general improvement in the wound condition 2/7; both wounds appear smaller. I was hopeful the right medial would be closed however there there is still the same small open area. Slight amount of surface eschar on the left the dimensions are smaller there is eschar but the wound edges appear to be free. We have been using silver alginate under Unna boot's 2/14; both wounds once again measure smaller. Circumferential eschar on the left medial. We have been using silver alginate under Unna boots with gradual improvement 2/21; the area on the right medial malleolus has healed. The area on the left is smaller. We have been using silver alginate and Unna boots. We can discharge wrapping the right leg she has 20/30 stockings at home she will need to protect the scar tissue in this area 2/28; the area on the right medial malleolus remains closed the patient has a compression stocking. The area on the left is smaller. We have been using silver alginate and Unna boots. 3/6 the area on the right medial ankle remains closed. Good edema control noted she is using her own compression stocking. The area on the left medial ankle is smaller. We have been managing this with silver alginate and Unna boots which we will continue today. 3/13; the area on the right medial ankle remains closed and I'm declaring it healed today. When necessary the left is about the same still a healthy-looking surface but no major change and wound area. No evidence of infection and using silver alginate under unna and generally making considerable improvement 3/27 the area on the right medial ankle remains  closed the area on the left is about the same as last week. Certainly not any worse we have been using silver alginate under an Unna boot 4/3; the area on the right medial ankle remains closed per the patient. We did not look at this  wound. The wound on the left medial ankle is about the same surface looks healthy we have been using silver alginate under an Unna boot 4/10; area on the right medial ankle remains closed per the patient. We did not look at this wound. The wound on the left medial ankle is slightly larger. The patient complains that the [facility] caused burning pain all week. She also told us that she was a lot more active this week. Changed her back to silver alginate 4/17; right medial ankle still closed per the patient. Left medial ankle is slightly larger. Using silver alginate. She did not tolerate Hydrofera Blue on this area 4/24; right medial ankle remains closed we have not look at this. The left medial ankle continues to get larger today by about a centimeter. We have been using silver alginate under Unna boots. She complains about 4 layer compression as an alternative. She has been up on her feet working on her garden 5/8; right medial ankle remains closed we did not look at this. The left medial ankle has increased in size about 100%. We have been using silver alginate under Unna boots. She noted increased pain this week and was not surprised that the wound is deteriorated 5/15; no major change in SA however much less erythema ( one week of doxy ocellulitis). 5/22-66 year old female returns at 1 week to the clinic for left medial ankle wound for which we have been using silver alginate under 3 layer compression She was placed on DOXY at last visit - the wound is wider at this visit. She is in 3 layer compression 5/29; change to [facility] last week. I had given her empiric doxycycline 2 weeks ago for a week. She is in 3 layer compression. She complains of a lot of pain and drainage on presentation today. 6/5; using Hydrofera Blue. I gave her doxycycline recently empirically for erythema and pain around the wound. Believe her cultures showed enterococcus which not would not have been well covered  by doxycycline nevertheless the wound looks better and I don't feel specifically that the enterococcus needs to be covered. She has a new what looks like a wrap injury on her lateral left ankle. 6/12; she is using Hydrofera Blue. She has a new area on the left anterior lower tibial area. This was a wrap injury last week. 6/19; the patient is using Hydrofera Blue. She arrived with marked inflammation and erythema around the wound and tenderness. 12/01/18 on evaluation today patient appears to be doing a little bit better based on what I'm hearing from the standpoint of lassos evaluation to this as far as the overall appearance of the wound is concerned. Then sometime substandard she typically sees Dr. Dellia Nims. Nonetheless overall very pleased with the progress that she's made up to this point. No fevers, chills, nausea, or vomiting noted at this time. 7/10; some improvement in the surface area. Aggressively debrided last week apparently. I went ahead with the debridement today although the patient does not tolerate this very well. We have been using Iodoflex. Still a fair amount of drainage 7/17; slightly smaller. Using Iodoflex. 7/24; no change from last week in terms of surface area. We have been using Iodoflex. Surface looks  and continues to look somewhat better 7/31; surface area slightly smaller better looking surface. We have been using Iodoflex. This is under Unna boot compression 8/7-Patient presents at 1 week with Unna boot and Iodoflex, wound appears better 8/14-Patient presents at 1 week with Iodoflex, we use the Unna boot, wound appears to be stable better.Patient is getting Botox treatment for the inversion of the foot for tendon release, Next week 8/21; we are using Iodoflex. Unna boot. The wound is stable in terms of surface area. Under illumination there is some areas of the wound that appear to be either epithelialized or perhaps this is adherent slough at this point I was not really  clear. It did not wipe off and I was reluctant to debride this today. 8/28; we are using Iodoflex in an Unna boot. Seems to be making good improvement. 9/4; using Iodoflex and wound is slightly smaller. 9/18; we are using Iodoflex with topical silver nitrate when she is here. The wound continues to be smaller 10/2; patient missed her appointment last week due to GI issues. She left and Iodoflex based dressing on for 2 weeks. Wound is about the same size about the size of a dime on the left medial lower 10/9 we have been using Iodoflex on the medial left ankle wound. She has a new superficial probable wrap injury on the dorsal left ankle 10/16; we have been using Hydrofera Blue since last week. This is on the left medial ankle 10/23; we have been using Hydrofera Blue since 2 weeks ago. This is on the left medial ankle. Dimensions are better 11/6; using Hydrofera Blue. I think the wound is smaller but still not closed. Left medial ankle 11/13; we have been using Hydrofera Blue. Wound is certainly no smaller this week. Also the surface not as good. This is the remanent of a very large area on her left medial ankle. 11/20; using Sorbact since last week. Wound was about the same in terms of size although I was disappointed about the surface debris 12/11; 3-week follow-up. Patient was on vacation. Wound is measuring slightly larger we have been using Sorbact. 12/18; wound is about the same size however surface looks better last week after debridement. We have been using Sorbact under compression 1/15 wound is probably twice the size of last time increased in length nonviable surface. We have been using Sorbact. She was running a mild fever and missed her appointment last week 1/22; the wound is come down in size but under illumination still a very adherent debris we have been Hydrofera Blue that I changed her to last week 1/29; dimensions down slightly. We have been using Hydrofera Blue 2/19 dimensions  are the same however there is rims of epithelialization under illumination. Therefore more the surface area may be epithelialized 2/26; the patient's wound actually measures smaller. The wound looks healthy. We have been using Hydrofera Blue. I had some thoughts about running Apligraf then I still may do that however this looks so much better this week we will delay that for now 3/5; the wound is small but about the same as last week. We have been using Hydrofera Blue. No debridement is required today. 3/19; the wound is about the size of a dime. Healthy looking wound even under illumination. We have been using Hydrofera Blue. No mechanical debridement is necessary 3/26; not much change from last week although still looks very healthy. We have been using Hydrofera Blue under Unna boots Patient was offered an ankle fusion by  podiatry but not until the wound heals with a proceed with this. 4/9; the patient comes in today with her original wound on the medial ankle looking satisfactory however she has some uncontrolled swelling in the middle part of her leg with 2 new open areas superiorly just lateral to the tibia. I think this was probably a wrap issue. She said she felt uncomfortable during the week but did not call in. We have been using Hydrofera Blue 4/16; the wound on the medial ankle is about the same. She has innumerable small areas superior to this across her mid tibia. I think this is probably folliculitis. She is also been working in the yard doing a lot of sweating 4/30; the patient issue on the upper areas across her mid tibia of all healed. I think this was excessive yard work if I remember. Her wound on the medial ankle is smaller. Some debris on this we have been using Hydrofera Blue under Unna boots 5/7; mid tibia. She has been using Hydrofera Blue under an Unna wrap. She is apparently going for her ankle surgery on June 3 10/28/19-Patient returns to clinic with the ankle wound, we are  using Hydrofera Blue under Unna wrap, surgery is scheduled for her left foot for June 3 so she will be back for nurse visit next week READMISSION 01/17/2020 Mrs. Abair is a 66 year old woman we have had in this clinic for a long period of time with severe venous hypertension and refractory wounds on her medial lower legs and ankles bilaterally. 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 When she was off her legs for a prolonged period example when she fell and suffered a compression fracture things would heal up quite nicely. She is now retired and we managed to heal up the right medial leg wound. The left one was very tiny last time I saw this although still refractory. 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 It got to the point where this was interfering with her walking and she elected to proceed with a ankle arthrodesis to straighten her her ankle and leave her with a functional outcome for mobilization. The patient was referred to Dr. Doren Custard and really this took some time to arrange. Dr. Doren Custard saw her on 12/07/2019. Once again he verified that she had no arterial issues. She had previously had an angiogram several years ago. Follow-up ABIs on the left showed an ABI of 1.12 with triphasic waveforms and a TBI of 0.92. She is felt to have chronic deep venous insufficiency but I do not think it was felt that anything could be done from about this from an ablation point of view. At the time Dr. Doren Custard saw this patient the wounds actually look closed via the pictures in his clinic. The patient finally underwent her surgery on 12/15/2019. This went reasonably well and there was a good anatomic outcome. She developed a small distal wound dehiscence on the lateral part of the surgical wound.  However more problematically she is developed recurrence of the wound on the medial left ankle. There are actually 2 wounds here one in the distal lower leg and 1 pretty much at the level of the medial malleolus. It is a more distal area that is more problematic. She has been using Hydrofera  Blue which started on Friday before this she was simply Ace wrapping. There was a culture done that showed Pseudomonas and she is on ciprofloxacin. A recent CNS on 8/11 was negative. The patient reports some pain but I generally think this is improving. She is using a cam boot completely nonweightbearing using a walker for pivot transfers and a wheelchair 8/24; not much improvement unfortunately she has a surgical wound on the lateral part in the venous insufficiency wound medially. The bottom part of the medial insufficiency wound is still necrotic there is exposed tendon here. We have been using Hydrofera Blue under compression. Her edema control is however better 8/31; patient in for follow-up of his surgical wound on the lateral part of her left leg and chronic venous insufficiency ulcers medially. We put her back in compression last week. She comes in today with a complaint of 3 or 4 days worth of increasing pain. She felt her cam walker was rubbing on the area on the back of her heel. However there is intense erythema seems more likely she has cellulitis. She had 2 cultures done when she was seeing podiatry in the postop. One of them in late July showed Pseudomonas and she received a course of ciprofloxacin the other was negative on 8/11 she is allergic to penicillin with anaphylactoid complaints of hives oral swelling via information in epic 9/9; when I saw this patient last week she had intense anterior erythema around her wound on the right lateral heel and ankle and also into the right medial heel. Some of this was no doubt drainage and her walker boot however I was convinced she had cellulitis. I gave  her Levaquin and Bactrim she is finishing up on this now. She is following up with Dr. [name] he saw her yesterday. He is taken her out of the walking boot of course she is still nonweightbearing. Her x-ray was negative for any worrisome features such as soft tissue air etc. Things are a lot better this week. She has home health. We have been using Hydrofera Blue under an [employer] which she put back on yesterday. I did not wrap her last week 9/17; her surrounding skin looks a lot better. In fact the area on the left lateral ankle has just a scant amount of eschar. The only remaining wound is the large area on the left medial ankle. Probably about 60% of this is healthy granulation at the surface however she has a significant divot distally. This has adherent debris in it. I been using debridement and silver collagen to try and get this area to fill-in although I do not think we have made much progress this week 9/24; the patient's wound on the left medial ankle looks a lot better. The deeper divot area distally still requires debridement but this is cleaning up quite nicely we have been using silver collagen. The patient is complaining of swelling in her foot and is worried that that is contributing to the nonhealing of the ankle wound. She is also complaining of numbness in her anterior toes 10/4; left medial ankle. The small area distally still has a divot with necrotic material that I have been debriding away. This has an undermining area. She is approved for Apligraf. She saw Dr. [name] her surgeon on 10/1. I think he declared himself is satisfied with the condition of things. Still nonweightbearing till the end of the month. 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 Her surgical wound  is well closed. There is no evidence of infection 10/11; the patient arrived in clinic today with the expectation that we be able to put an Apligraf on this area after debridement  however she arrives with a relatively large amount of green drainage on the dressing. The patient states that this started on Friday. She has not been systemically unwell. 10/19; culture I did last week showed both Enterococcus and Pseudomonas. I think this came in separate parts because I stopped her ciprofloxacin I gave her and prescribed her linezolid however now looking at the final culture result this is Pseudomonas which is resistant to quinolones. She has not yet picked up the linezolid apparently phone issues. We are also trying to get a topical antibiotic out of [place] in [place] they can be applied by home health. She is still having green drainage 10/16; the patient has her topical antibiotic from [facility] in [place]. This is a compounded gel with vancomycin and ciprofloxacin and gentamicin. We are applying this on the wound bed with silver alginate over the top with Unna boot wraps. She arrives in clinic today with a lot less ominous looking drainage although she is only use this topical preparation once the second time today. She sees Dr. [name] her surgeon on Friday she has home health changing the dressing 11/2; still using her compounded topical antibiotic under silver alginate. Surface is cleaning up there is less drainage. We had an Apligraf for her today and I elected to apply it. A light coating of her antibiotic 04/25/2020 upon evaluation today patient appears to be doing well with regard to her ankle ulcer. There is a little bit of slough noted on the surface of the wound I am can have to perform sharp debridement to clear this away today. With that being said other than that fact overall I feel like she is making progress and we do see some new epithelial growth. There is also some improvement in the depth of the wound and that distal portion. There is little bit of slough there as well. 12/7; 2-week follow-up. Apligraf #3. Dimensions are smaller. Closing in  especially inferiorly. Still some surface debris. Still using the [facility] topical antibiotic but I told her that I don't think this needs to be renewed 12/21; 2-week follow-up. Apligraf #4 dimensions are smaller. Nice improvement 06/05/2020; 2-week follow-up. The patient's wound on the left medial ankle looks really excellent. Nice granulation. Advancing epithelialization no undermining no evidence of infection. We would have to reapply for another Apligraf but with the condition of this wound I did not feel strongly about it. We used Hydrofera Blue under the same degree of compression. She follows up with Dr. [name] her surgeon a week Friday 06/13/2020 upon evaluation today patient appears to be doing excellent in regard to her wound. She has been tolerating the dressing changes without complication. Fortunately there is no signs of active infection at this time. No fevers, chills, nausea, vomiting, or diarrhea. She was using Hydrofera Blue last week. 06/20/2020 06/20/2020 on evaluation today patient actually appears to be doing excellent in regard to her wound. This is measuring better and looking much better as well. She has been using the collagen that seems to be doing better for her as well even though the [facility] was and is not sticking or feeling as rough on her wound. She did see Dr. [name] on Friday he is very pleased he also stated none of the hardware has shifted. That is great  news 1/27; the patient has a small clean wound all that is remaining. I agree that this is too small to really consider an Apligraf. Under illumination the surface is looking quite good. We have been using collagen although the dimensions are not any better this week 2/2; the patient has a small clean wound on the left medial ankle. Although this left of her substantial original areas. Measurements are smaller. We have been using polymen Ag under an Haematologist. 2/10; small area on the left medial ankle. This looks  clean nothing to debride however dimensions are about the same we have been using polymen I think now for 2 weeks 2/17; not much change in surface area. We have been using polymen Ag without any improvement. 3/17; 1 month follow-up. The patient has been using endoform without any improvement in fact I think this looks worse with more depth and more expansion 3/24; no improvement. Perhaps less debris on the surface. We have been using Sorbact for 1 week 4/4; wound measures larger. She has edema in her leg and her foot which she tells as her wrap came down. We have been using Unna boots. Sorbact of the wound. She has been approved for Apligraf 09/12/2020 upon evaluation today patient appears to be doing well with regard to her wound. We did get the Apligraf reapproved which is great news we have that available for application today. Fortunately there is no signs of infection and overall the patient seems to be doing great. The wound bed is nice and clean. 4/27; patient presents for her second application of Apligraf. She states over the past week she has been on her feet more often due to being outside in her garden. She has noted more swelling to her foot as a result. She denies increased warmth, pain or erythema to the wound site. 10/10/2020 upon evaluation today patient appears to be doing well with regard to her wound which does not appear to be quite as irritated as last week from what I am hearing. With that being said unfortunately she is having issues with some erythema and warmth to touch as well as an increase size. I do believe this likely is infected. 10/17/2020 upon evaluation today patient appears to be doing excellent in regard to her wound this is significantly improved as compared to last week. Fortunately I think that the infection is much better controlled at this point. She did have evidence of both Enterococcus as well as Staphylococcus noted on culture. Enterococcus really would not  be helped significantly by the Cipro but the wound is doing so much better I am under the assumption that the Staphylococcus is probably the main organism that is causing the current infection. Nonetheless I think that she is doing excellent as far as that is concerned and I am very pleased in that regard. I would therefore recommend she continue with the Cipro. 10/31/2020 upon evaluation today patient appears to be doing well with regard to her wound. She has been tolerating the dressing changes without complication. Fortunately there is no signs of active infection and overall I am extremely pleased with where things stand today. No fevers, chills, nausea, vomiting, or diarrhea. With that being said she does have some green drainage coming from the wound and although it looks okay I am a little concerned about the possibility of a continuing infection. Specifically with Pseudomonas. For that reason I will go ahead and send in a prescription for Cipro for her to be continued.  11/14/2020 upon evaluation today patient appears to be doing very well currently in regard to her wound on her leg. She has been tolerating the dressing changes without complication. Fortunately I feel like the infection is finally under good control here. Unfortunately we do not have the Apligraf for application today although we can definitely order to have it in place for next week. That will be her fifth and final of the current series. Nonetheless I feel like her wound is really doing quite well which is great news. 11/21/2020 upon evaluation today patient appears to be doing well with regard to her wound on the medial ankle. Fortunately I think the infection is under control and I do believe we can go ahead and reapply the Apligraf today. She is in agreement with that plan. There does not appear to be any signs of active infection at this time which is great news. No fevers, chills, nausea, vomiting, or diarrhea. 12/05/2020 upon  evaluation today patient's wound bed actually showed signs of good granulation epithelization at this point. There does not appear to be any signs of infection which is great news and overall very pleased with where things stand. Overall the patient seems to be doing fairly well in my opinion with regard to her wound although I do believe she continues to build up a lot of biofilm I think she could benefit from using PuraPly at this point. 12/12/2020 upon evaluation today patient's wound actually appears to be doing decently well today. The Unna boot has not been quite as well-tolerated so that more uncomfortable for her and even causing some pressure over the plate on the lateral portion of her foot which is 90 where the wound is. There did not appear to be any significant deep tissue injury with that there may be a minimal change in the skin noted I think that we may want to go back to the Coflex 2 layer which is a little bit easier on her skin it seems. 12/19/2020 upon evaluation today patient actually seems to be making great progress with the PuraPly currently. She in fact seems to be much better as far as the overall appearance of the wound bed is concerned I am very happy in this regard. I do not see any signs of of infection which is great news as well. No fevers, chills, nausea, vomiting, or diarrhea. 12/26/2020 upon evaluation today patient appears to actually be doing better in regard to her wound on the left medial ankle region. The surface of the wound is actually doing significantly better which is great news. There does not appear to be any signs of infection which is also great news and in general I am extremely pleased with where we stand today. 01/02/2021 upon evaluation today patient appears to be doing well with regard to her wound. In fact this is showing signs of excellent improvement and very pleased with where things stand. In fact the last 3 appointments have all shown signs of this  getting smaller which is excellent news. I have not even had to perform any debridement and today is no exception. Overall I feel like this is dramatically improved compared to previous. T oday is PuraPly application #4. 6/64/4034 upon evaluation today patient appears to be doing excellent in regard to her wound this is continue to show signs of improvement and overall I am extremely pleased with where we stand today. She is actually here for PuraPly application #5. Every time we have applied this she  is noted definite improvement on measurements. 01/23/2021 upon evaluation today patient is actually making good progress in regard to her wound. This was actually on just a little bit longer this time compared to previous due to the fact that she did have to go out of town. She is actually here for PuraPly application #6. We have definitely been seeing improvements in the overall quality of the tissue on the surface of the wound which is awesome news. In general I think that the patient seems to be continuing to make great progress here. 01/30/2021 upon evaluation today patient's wound is actually doing excellent. There is really not any significant biofilm buildup which is great news and overall I am extremely pleased with where things stand today. There does not appear to be any signs of active infection. No fevers, chills, nausea, vomiting, or diarrhea. 02/06/2021 upon evaluation today patient's wound is actually showing signs of excellent improvement which is great news. We continue to see the benefit of the PuraPly this is doing a great job the wound seems not really irritated whatsoever and is showing signs of good granulation at this point. Overall I am extremely happy with what we are seeing. The patient likewise is happy to hear all of this as well. 02/13/2021 upon evaluation today patient appears to be doing well with regard to her wound. This again is measuring smaller today and I am very pleased  in this regard. Fortunately there does not appear to be any signs of active infection at this time which is good news from a systemic standpoint. Locally there is still some significant drainage which she does have is concerned about infection locally. No fevers, chills, nausea, vomiting, or diarrhea. 02/20/2021 upon evaluation today patient actually appears to be doing decently well in regard to her wound. She has been tolerating the dressing changes without complication. I do believe the PuraPly is helping wound bed does appear to be doing better. There is no evidence of active infection locally or systemically at this point visually although on fluorescence imaging there still appear to be bacterial load and bioburden noted. 02/27/2021 upon evaluation today patient fortunately continues to show signs of improvement with use of the PuraPly currently. Subsequently we did review her culture results and to be honest I think that the Bactrim is probably the best option to have her continue at this point. For that reason I am get a go ahead and send in a refill today for her for an additional 2 weeks. Nonetheless I think that we are making excellent progress here. It was Enterococcus and Staphylococcus that were noted she is allergic to penicillin so there is not much I can do from the Enterococcus standpoint though the staff does seem to be sensitive to the Bactrim. 03/06/2021 upon evaluation today patient appears to be doing well with regard to her wound. This is definitely showing signs of improvement which is great news and overall I am extremely pleased with where things stand today. There does not appear to be any signs of active infection which is great news and overall and I do believe that we are headed in the appropriate direction I think the PuraPly is doing an awesome job for her. 03/20/2021 upon evaluation today patient actually appears to be doing well with regard to her wound. We have been  using the PuraPly although last week when I was out of the office they actually just used endoform nonetheless this still seems to be doing great. Fortunately  there does not appear to be any signs of active infection which is great news overall I think the patient is making wonderful progress. 03/27/2021 upon evaluation today patient appears to be doing well with regard to her wound in fact there is a little drainage but otherwise her does not appear to be any major signs of open wounds nor infection at this time. Overall I am extremely pleased with where things stand. No fevers, chills, nausea, vomiting, or diarrhea. 04/03/2021 upon evaluation today patient presents for reevaluation here in the clinic and overall she seems to be doing quite well. Fortunately there does not appear to be any signs of active infection which is great news and in general I am very pleased with how things appear. There is still a very small opening remaining but in general she is looking much better. Fortunately I do not think there is any need for the PuraPly at this point. 04/10/2021 upon evaluation today patient's wound is actually showing signs of excellent improvement. I am actually very pleased with where it stands today. I think she is very close to complete closure in fact this may be healed today. Either way I think that she may benefit from using her own compression socks for the next week and then will subsequently see how things stand following. 04/17/2021 upon evaluation today patient appears to be doing well with regard to her wound on the ankle region. This is actually showing signs of excellent improvement which is great news. I do not see any signs of active infection at this time which is great and no open wound. With that being said on the lateral portion of her ankle right at the base of her plate she actually has an area of pressure that occurred over the past week when she was not in the wrap or we been  padding her. This does seem to be an issue which I think is good to be an ongoing problem I am can actually get in touch with Dr. [name] today to see what we can do in that regard. 04/24/2021 upon evaluation today patient appears to be doing well with regard to her ankle region for the most part although there does appear to be some fluid trapped underneath the area where we thought this was healed last week. Unfortunately I think at this time that we can have to reopen and clean away some of this area and then subsequently depending on how things do over the next week or so we will see about discharge but right now I do not think that is the right way to go. She voiced understanding. 05/01/2021 upon evaluation today patient's wound still appears to be open unfortunately. I do believe that we do need to see what we can do about trying to get this closed I believe compression wrapping is probably can to be indicated here. Fortunately there is no signs of active infection at this time. 05/08/2021 upon evaluation today patient appears to be doing okay in regard to her ulcer this is very superficial and we will see any major issues here. The biggest problem currently that I find is that we just cannot get this area to completely seal up. She seemed to almost be and then things digressed a little bit. Right now is not too bad but we do need to see if we can make some difference here. I think maybe switching over to collagen may be better. 05/15/2021 upon evaluation today patient actually appears to  be doing pretty well in regard to her wound. This is measuring a little smaller and looking much better. She did see orthopedics today there to me making her a insert for her shoe to try to help straighten out her gait. I think this can be beneficial for her as well. 05/22/2021 upon evaluation today patient appears to be doing well currently in regard to her wound although is not significantly smaller unfortunately  this at least does not show any signs of active infection at this time which is great news. No fevers, chills, nausea, vomiting, or diarrhea. I am going to see about switching over to endoform that is what we wanted to use in the past always been on backorder. 06/05/2021 upon evaluation today patient appears to be doing somewhat poorly in regard to her wound. This is really not showing the signs of improvement that I would have like to have seen. Fortunately I do not see any signs of active infection locally nor systemically which is great news. With that being said even though I do not see the sign she is having some increased discomfort which is not good. She is also having some issues here with the wound looking a little larger today compared to where its been. With that being said overall I believe that we probably need to consider at least that there may be a subacute infection here and that is what is going on. We have tried multiple different medications and dressings which have done well for her in the past without seeing any signs of improvement. She is in agreement with addressing this as such. 06/12/2020 upon evaluation today patient's wound is actually showing some signs of improvement today. In fact I do not even see any need for sharp debridement based on what I see at this point today. Fortunately there is no evidence of active infection currently which is great news. No fevers, chills, nausea, vomiting, or diarrhea. Objective Constitutional Well-nourished and well-hydrated in no acute distress. Vitals Time Taken: 10:13 AM, Height: 68 in, Source: Stated, Temperature: 97.9 F, Pulse: 55 bpm, Respiratory Rate: 18 breaths/min, Blood Pressure: 109/52 mmHg. Respiratory normal breathing without difficulty. Psychiatric this patient is able to make decisions and demonstrates good insight into disease process. Alert and Oriented x 3. pleasant and cooperative. General Notes: Upon inspection  patient's wound bed showed signs of good granulation epithelization at this point. Overall I do believe that she is actually making fairly good progress here based on what I see and I think that the antibiotic has been beneficial from last week to this week. My suggestion is to continue with the current wound care measures since the culture did show results that were intermediate with the Cipro can have her actually switch over to the Bactrim which she is done well with in the past. This should help take care of the situation and we will do that for an additional 2 weeks to ensure this completely clears. Integumentary (Hair, Skin) Wound #15R status is Open. Original cause of wound was Gradually Appeared. The date acquired was: 12/30/2019. The wound has been in treatment 73 weeks. The wound is located on the Left,Medial Malleolus. The wound measures 1.8cm length x 1cm width x 0.1cm depth; 1.414cm^2 area and 0.141cm^3 volume. There is Fat Layer (Subcutaneous Tissue) exposed. There is no tunneling or undermining noted. There is a medium amount of serosanguineous drainage noted. The wound margin is distinct with the outline attached to the wound base. There is large (67-100%)  pink granulation within the wound bed. There is a small (1- 33%) amount of necrotic tissue within the wound bed including Adherent Slough. 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 There was a Double Layer Compression Therapy Procedure by Baruch Gouty, RN. Post procedure Diagnosis Wound #15R: Same as Pre-Procedure Plan Follow-up Appointments: Return Appointment in  1 week. Margarita Grizzle Bathing/ Shower/ Hygiene: May shower with protection but do not get wound dressing(s) wet. Edema Control - Lymphedema / SCD / Other: Elevate legs to the level of the heart or above for 30 minutes daily and/or when sitting, a frequency of: - throughout the day when sitting Avoid standing for long periods of time. Patient to wear own compression stockings every day. Exercise regularly Moisturize legs daily. Non Wound Condition: Apply the following to affected area as directed: - Apply silicone bordered foam to left lateral foot for protection The following medication(s) was prescribed: Bactrim DS oral 800 mg-160 mg tablet 1 1 tablet oral taken 2 times per day for 14 days starting 06/12/2021 WOUND #15R: - Malleolus Wound Laterality: Left, Medial Cleanser: Wound Cleanser (Generic) 1 x Per Day/15 Days Discharge Instructions: Cleanse the wound with wound cleanser prior to applying a clean dressing using gauze sponges, not tissue or cotton balls. Peri-Wound Care: Sween Lotion (Moisturizing lotion) 1 x Per Day/15 Days Discharge Instructions: Apply moisturizing lotion as directed Prim Dressing: Hydrofera Blue Ready Foam, 2.5 x2.5 in 1 x Per Day/15 Days ary Discharge Instructions: Apply to wound bed as instructed Secondary Dressing: Woven Gauze Sponge, Non-Sterile 4x4 in (Generic) 1 x Per Day/15 Days Discharge Instructions: Apply over primary dressing as directed. Com pression Wrap: CoFlex TLC XL 2-layer Compression System 4x7 (in/yd) 1 x Per Day/15 Days Discharge Instructions: Apply CoFlex 2-layer compression as directed. (alt for 4 layer) 1. I would recommend at this point that we going to continue with the wound care measures as before with regard to the [facility] which I think is actually doing a good job here. 2. Also can recommend that we continue with the gauze to cover which she seems to be well within the Coflex 2 layer compression wrap. 3. I am also going to send in a  prescription for the Bactrim which I think will do well for her and she is done excellent taken this before with no complications I think this is a good option to make sure that the infection is completely cleared. We will see patient back for reevaluation in 1 week here in the clinic. If anything worsens or changes patient will contact our office for additional recommendations. Electronic Signature(s) Signed: 06/12/2021 12:40:21 PM By: [name] PA-C Entered By: [name] on 06/12/2021 12:40:20 -------------------------------------------------------------------------------- SuperBill Details Patient Name: Date of Service: [facility] [name], [name] 06/12/2021 Medical Record Number: [id number] Patient Account Number: [id number] Date of Birth/Sex: Treating RN: Sep 21, 1955 (66 y.o. [name] Provider: [name] Other Clinician: Referring Provider: Treating Provider/Extender: [name] in Treatment: 51 Diagnosis Coding ICD-10 Codes Code Description [phone] Chronic venous hypertension (idiopathic) with ulcer and inflammation of left lower extremity L97.828 Non-pressure chronic ulcer of other part of left lower leg with other  specified severity L97.328 Non-pressure chronic ulcer of left ankle with other specified severity Facility Procedures CPT4 Code: [id number] Description: (Facility Use Only) [id number] - [place] LWR LT LEG Modifier: Quantity: 1 Physician Procedures : CPT4 Code Description Modifier [id number] - WC PHYS LEVEL 4 - EST PT ICD-10 Diagnosis Description I87.332 Chronic venous hypertension (idiopathic) with ulcer and inflammation of left lower extremity L97.828 Non-pressure chronic ulcer of other part  of left lower leg with other specified severity L97.328 Non-pressure chronic ulcer of left ankle with other specified severity Quantity: 1 Electronic Signature(s) Signed: 06/12/2021 12:47:06 PM By: [name] PA-C Entered By: [name] on 06/12/2021 12:47:06

## 2021-06-19 ENCOUNTER — Encounter (HOSPITAL_BASED_OUTPATIENT_CLINIC_OR_DEPARTMENT_OTHER): Payer: Medicare Other | Admitting: Internal Medicine

## 2021-06-19 ENCOUNTER — Other Ambulatory Visit: Payer: Self-pay

## 2021-06-19 DIAGNOSIS — L97828 Non-pressure chronic ulcer of other part of left lower leg with other specified severity: Secondary | ICD-10-CM | POA: Diagnosis not present

## 2021-06-19 NOTE — Progress Notes (Signed)
[name], [name] ([id number]) Visit Report for 06/19/2021 SuperBill Details Patient Name: Date of Service: [facility] [name], [name] 06/19/2021 Medical Record Number: [id number] Patient Account Number: [id number] Date of Birth/Sex: Treating RN: 28-Jul-1955 (66 y.o. [name] Primary Care Provider: [name] Other Clinician: Referring Provider: Treating Provider/Extender: [name] in Treatment: 74 Diagnosis Coding ICD-10 Codes Code Description [phone] Chronic venous hypertension (idiopathic) with ulcer and inflammation of left lower extremity L97.828 Non-pressure chronic ulcer of other part of left lower leg with other specified severity L97.328 Non-pressure chronic ulcer of left ankle with other specified severity Facility Procedures CPT4 Code Description Modifier Quantity [phone] (Facility Use Only) [phone] - APPLY MULTLAY COMPRS LWR LT LEG 1 Electronic Signature(s) Signed: 06/19/2021 12:02:35 PM By: Deon Pilling RN, BSN Signed: 06/19/2021 3:43:46 PM By: Linton Ham MD Entered By: Deon Pilling on 06/19/2021 10:22:56

## 2021-06-19 NOTE — Progress Notes (Signed)
FEBE, CHAMPA ([id number]) Visit Report for 06/19/2021 Arrival Information Details Patient Name: Date of Service: [facility] [name], [name] 06/19/2021 9:45 A M Medical Record Number: [id number] Patient Account Number: [id number] Date of Birth/Sex: Treating RN: 09/26/1955 (66 y.o. [name], [name] Primary Care Korrie Hofbauer: [name] Other Clinician: Referring Launi Asencio: Treating Petar Mucci/Extender: [name] in Treatment: 83 Visit Information History Since Last Visit Added or deleted any medications: No Patient Arrived: [name] Any new allergies or adverse reactions: No Arrival Time: 10:06 Had a fall or experienced change in No Accompanied By: self activities of daily living that may affect Transfer Assistance: None risk of falls: Patient Identification Verified: Yes Signs or symptoms of abuse/neglect since last visito No Secondary Verification Process Completed: Yes Hospitalized since last visit: No Patient Requires Transmission-Based Precautions: No Implantable device outside of the clinic excluding No Patient Has Alerts: Yes cellular tissue based products placed in the center Patient Alerts: L ABI =1.12, TBI = .92 since last visit: R ABI= 1.02, TBI= .58 Has Dressing in Place as Prescribed: Yes Has Compression in Place as Prescribed: Yes Pain Present Now: No Electronic Signature(s) Signed: 06/19/2021 12:02:35 PM By: Deon Pilling RN, BSN Entered By: Deon Pilling on 06/19/2021 10:07:19 -------------------------------------------------------------------------------- Compression Therapy Details Patient Name: Date of Service: [name], [name] 06/19/2021 9:45 A M Medical Record Number: [id number] Patient Account Number: [id number] Date of Birth/Sex: Treating RN: 11-01-1955 (66 y.o. [name] Primary Care Jakobe Blau: [name] Other Clinician: Referring Acy [name]: Treating Haylin Camilli/Extender: [name] in Treatment:  74 Compression Therapy Performed for Wound Assessment: Wound #15R Left,Medial Malleolus Performed By: Clinician Deon Pilling, RN Compression Type: Double Layer Electronic Signature(s) Signed: 06/19/2021 12:02:35 PM By: Deon Pilling RN, BSN Entered By: Deon Pilling on 06/19/2021 10:21:52 -------------------------------------------------------------------------------- Encounter Discharge Information Details Patient Name: Date of Service: [name], [name] 06/19/2021 9:45 A M Medical Record Number: [id number] Patient Account Number: [id number] Date of Birth/Sex: Treating RN: [birth year] (66 y.o. [name] Primary Care Kimon Loewen: [name] Other Clinician: Referring Kadrian Partch: Treating Kameo Bains/Extender: [name] in Treatment: 81 Encounter Discharge Information Items Discharge Condition: Stable Ambulatory Status: Cane Discharge Destination: Home Transportation: Private Auto Accompanied By: self Schedule Follow-up Appointment: Yes Clinical Summary of Care: Electronic Signature(s) Signed: 06/19/2021 12:02:35 PM By: Deon Pilling RN, BSN Entered By: Deon Pilling on 06/19/2021 10:22:44 -------------------------------------------------------------------------------- Patient/Caregiver Education Details Patient Name: Date of Service: [name], [name] 1/18/2023andnbsp9:45 A M Medical Record Number: [id number] Patient Account Number: [id number] Date of Birth/Gender: Treating RN: [birth year] (66 y.o. [name] Primary Care Physician: [name] Other Clinician: Referring Physician: Treating Physician/Extender: [name] in Treatment: 9 Education Assessment Education Provided To: Patient Education Topics Provided Wound/Skin Impairment: Handouts: Skin Care Do's and Dont's Methods: Explain/Verbal Responses: Reinforcements needed Electronic Signature(s) Signed: 06/19/2021 12:02:35 PM By: Deon Pilling  RN, BSN Entered By: Deon Pilling on 06/19/2021 10:22:20 -------------------------------------------------------------------------------- Wound Assessment Details Patient Name: Date of Service: [name], [name] 06/19/2021 9:45 A M Medical Record Number: [id number] Patient Account Number: [id number] Date of Birth/Sex: Treating RN: 28-Oct-1955 (66 y.o. [name] Primary Care Manda Holstad: [name] Other Clinician: Referring Chikita Dogan: Treating Cane Dubray/Extender: [name] in Treatment: 74 Wound Status Wound Number: 15R Primary Etiology: Venous Leg Ulcer Wound Location: Left, Medial Malleolus Wound Status: Open Wounding Event: Gradually Appeared Date Acquired: 12/30/2019 Weeks Of Treatment: 74 Clustered Wound: No Wound Measurements  Length: (cm) 1.8 Width: (cm) 1 Depth: (cm) 0.1 Area: (cm) 1.414 Volume: (cm) 0.141 % Reduction in Area: 87.9% % Reduction in Volume: 98.5% Wound Description Classification: Full Thickness With Exposed Support Structur Exudate Amount: Medium Exudate Type: Serosanguineous Exudate Color: red, brown es Treatment Notes Wound #15R (Malleolus) Wound Laterality: Left, Medial Cleanser Wound Cleanser Discharge Instruction: Cleanse the wound with wound cleanser prior to applying a clean dressing using gauze sponges, not tissue or cotton balls. Peri-Wound Care Sween Lotion (Moisturizing lotion) Discharge Instruction: Apply moisturizing lotion as directed Topical Primary Dressing Hydrofera Blue Ready Foam, 2.5 x2.5 in Discharge Instruction: Apply to wound bed as instructed Secondary Dressing Woven Gauze Sponge, Non-Sterile 4x4 in Discharge Instruction: Apply over primary dressing as directed. Secured With Compression Wrap CoFlex TLC XL 2-layer Compression System 4x7 (in/yd) Discharge Instruction: Apply CoFlex 2-layer compression as directed. (alt for 4 layer) Compression Stockings Add-Ons Electronic  Signature(s) Signed: 06/19/2021 12:02:35 PM By: Deon Pilling RN, BSN Entered By: Deon Pilling on 06/19/2021 10:21:27 -------------------------------------------------------------------------------- Vitals Details Patient Name: Date of Service: [name], [name] 06/19/2021 9:45 A M Medical Record Number: [id number] Patient Account Number: [id number] Date of Birth/Sex: Treating RN: January 16, 1956 (66 y.o. [name], [name] Primary Care Yonna Alwin: [name] Other Clinician: Referring Tamarah Bhullar: Treating Olina Melfi/Extender: [name] in Treatment: 60 Vital Signs Time Taken: 10:06 Temperature (F): 97.9 Height (in): 68 Pulse (bpm): 60 Respiratory Rate (breaths/min): 20 Blood Pressure (mmHg): 111/64 Reference Range: 80 - 120 mg / dl Electronic Signature(s) Signed: 06/19/2021 12:02:35 PM By: Deon Pilling RN, BSN Entered By: Deon Pilling on 06/19/2021 10:07:38

## 2021-06-26 ENCOUNTER — Other Ambulatory Visit: Payer: Self-pay

## 2021-06-26 ENCOUNTER — Encounter (HOSPITAL_BASED_OUTPATIENT_CLINIC_OR_DEPARTMENT_OTHER): Payer: Medicare Other | Admitting: Physician Assistant

## 2021-06-26 DIAGNOSIS — L97828 Non-pressure chronic ulcer of other part of left lower leg with other specified severity: Secondary | ICD-10-CM | POA: Diagnosis not present

## 2021-06-26 NOTE — Progress Notes (Addendum)
ANSLEY, MANGIAPANE ([id number]) Visit Report for 06/26/2021 Chief Complaint Document Details Patient Name: Date of Service: [facility] [name], [name] 06/26/2021 10:15 A M Medical Record Number: [id number] Patient Account Number: [id number] Date of Birth/Sex: Treating RN: 05/15/1956 (66 y.o. [name] Provider: [name] Other Clinician: Referring Provider: Treating Provider/Extender: [name] in Treatment: 29 Information Obtained from: Patient Chief Complaint patient is been followed long-term in this clinic for venous insufficiency ulcers with inflammation, hypertension and ulceration over the medial ankle bilaterally. 01/17/2020; this is a patient who is here for review of postoperative wounds on the left lateral ankle and recurrence of venous stasis ulceration on the left medial Electronic Signature(s) Signed: 06/26/2021 10:27:00 AM By: [name] PA-C Entered By: [name] on 06/26/2021 10:27:00 -------------------------------------------------------------------------------- Debridement Details Patient Name: Date of Service: [facility] [name], [name] 06/26/2021 10:15 A M Medical Record Number: [id number] Patient Account Number: [id number] Date of Birth/Sex: Treating RN: 27-Mar-1956 (66 y.o. [name] Provider: [name] Other Clinician: Referring Provider: Treating Provider/Extender: [name] in Treatment: 31 Debridement Performed for Assessment: Wound #15R Left,Medial Malleolus Performed By: Physician [name], PA Debridement Type: Debridement Severity of Tissue Pre Debridement: Fat layer exposed Level of Consciousness (Pre-procedure): Awake and Alert Pre-procedure Verification/Time Out Yes - 10:55 Taken: Start Time: 10:55 Pain Control: Lidocaine 4% T opical Solution T Area Debrided (L x W): otal 1.2 (cm) x 0.7 (cm) = 0.84 (cm) Tissue and other material  debrided: Non-Viable, Callus, Skin: Epidermis Level: Skin/Epidermis Debridement Description: Selective/Open Wound Instrument: Curette Bleeding: None Procedural Pain: 0 Post Procedural Pain: 3 Response to Treatment: Procedure was tolerated well Level of Consciousness (Post- Awake and Alert procedure): Post Debridement Measurements of Total Wound Length: (cm) 1.2 Width: (cm) 0.7 Depth: (cm) 0.2 Volume: (cm) 0.132 Character of Wound/Ulcer Post Debridement: Improved Severity of Tissue Post Debridement: Fat layer exposed Post Procedure Diagnosis Same as Pre-procedure Electronic Signature(s) Signed: 06/26/2021 3:45:35 PM By: [name] PA-C Signed: 06/26/2021 4:46:14 PM By: Baruch Gouty RN, BSN Entered By: Baruch Gouty on 06/26/2021 10:59:01 -------------------------------------------------------------------------------- HPI Details Patient Name: Date of Service: [name], [name] 06/26/2021 10:15 A M Medical Record Number: [id number] Patient Account Number: [id number] Date of Birth/Sex: Treating RN: 06/28/1955 (66 y.o. [name] Provider: [name] Other Clinician: Referring Provider: Treating Provider/Extender: [name] in Treatment: 27 History of Present Illness HPI Description: the remaining wound is over the left medial ankle. Similar wound over the right medial ankle healed largely with use of Apligraf. Most recently we have been using Hydrofera Blue over this wound with considerable improvement. The patient has been extensively worked up in the past for her venous insufficiency and she is not a candidate for antireflux surgery although I have none of the details available currently. 08/24/14; considerable improvement today. About 50% of this wound areas now epithelialized. The base of the wound appears to be healthier granulation.as opposed to last week when she had deteriorated a considerable  improvement 08/17/14; unfortunately the wound has regressed somewhat. The areas of epithelialization from the superior aspect are not nearly as healthy as they were last week. The patient thinks her Hydrofera Blue slipped. 09/07/14; unfortunately the area has markedly regressed in the 2 weeks since I've seen this. There is an odor surrounding erythema. The healthy granulation tissue that we had at the base of the  wound now is a dusky color. The nurse reports green drainage 09/14/14; the area looks somewhat better than last week. There is less erythema and less drainage. The culture I did did not show any growth. Nevertheless I think it is better to continue the Cipro and doxycycline for a further week. The remaining wound area was debridement. 09/21/14. Wound did not require debridement last week. Still less erythema and less drainage. She can complete her antibiotics. The areas of epithelialization in the superior aspect of the wound do not look as healthy as they did some weeks ago 10/05/14 continued improvement in the condition of this wound. There is advancing epithelialization. Less aggressive debridement required 10/19/14 continued improvement in the condition and volume of this wound. Less aggressive debridement to the inferior part of this to remove surface slough and fibrinous eschar 11/02/14 no debridement is required. The surface granulation appears healthy although some of her islands of epithelialization seem to have regressed. No evidence of infection 11/16/14; lites surface debridement done of surface eschar. The wound does not look to be unhealthy. No evidence of infection. Unfortunately the patient has had podiatry issues in the right foot and for some reason has redeveloped small surface ulcerations in the medial right ankle. Her original presentation involved wounds in this area 11/23/14 no debridement. The area on the right ankle has enlarged. The left ankle wound appears stable in terms of  the surface although there is periwound inflammation. There has been regression in the amount of new skin 11/30/14 no debridement. Both wound areas appear healthy. There was no evidence of infection. The the new area on the right medial ankle has enlarged although that both the surfaces appear to be stable. 12/07/14; Debridement of the right medial ankle wound. No no debridement was done on the left. 12/14/14 no major change in and now bilateral medial ankle wounds. Both of these are very painful but the no overt evidence of infection. She has had previous venous ablation 12/21/14; patient states that her right medial ankle wound is considerably more painful last week than usual. Her left is also somewhat painful. She could not tolerate debridement. The right medial ankle wound has fibrinous surface eschar 12/28/14 this is a patient with severe bilateral venous insufficiency ulcers. For a considerable period of time we actually had the one on the right medial ankle healed however this recently opened up again in June. The left medial ankle wound has been a refractory area with some absent flows. We had some success with Hydrofera Blue on this area and it literally closed by 50% however it is recently opened up Foley. Both of these were debridement today of surface eschar. She tolerates this poorly 01/25/15: No change in the status of this. Thick adherent escar. Very poor tolerance of any attempt at debridement. I had healed the right medial malleolus wound for a considerable amount of time and had the left one down to about 50% of the volume although this is totally regressed over the last 48 weeks. Further the right leg has reopened. she is trying to make a appointment with pain and vascular, previous ablations with Dr. Aleda Grana. I do not believe there is an arterial insufficiency issue here 02/01/15 the status of the adherent eschar bilaterally is actually improved. No debridement was done. She did not  manage to get vascular studies done 02/08/15 continued debridement of the area was done today. The slough is less adherent and comes off with less pressure. There is no surrounding infection  peripheral pulses are intact 02/15/15 selective debridement with a disposable curette. Again the slough is less adherent and comes off with less difficulty. No surrounding infection peripheral pulses are intact. 02/22/15 selective debridement of the right medial ankle wound. Slough comes off with less difficulty. No obvious surrounding infection peripheral pulses are intact I did not debridement the one on the left. Both of these are stable to improved 03/01/15 selective debridement of both wound areas using a curette to. Adherent slough cup soft with less difficulty. No obvious surrounding infection. The patient tells me that 2 days ago she noted a rash above the right leg wrap. She did not have this on her lower legs when she change this over she arrives with widespread left greater than right almost folliculitis-looking rash which is extremely pruritic. I don't see anything to culture here. There is no rash on the rest of her body. She feels well systemically. 03/08/15; selective debridement of both wounds using a curette. Base of this does not look unhealthy. She had limegreen drainage coming out of the left leg wound and describes a lot of drainage. The rash on her left leg looks improved to. No cultures were done. 03/22/15; patient was not here last week. Basal wounds does not look healthy and there is no surrounding erythema. No drainage. There is still a rash on the left leg that almost looks vasculitic however it is clearly limited to the top of where the wrap would be. 04/05/15; on the right required a surgical debridement of surface eschar and necrotic subcutaneous tissue. I did not debridement the area on the left. These continue to be large open wounds that are not changing that much. We were successful at  one point in healing the area on the right, and at the same time the area on the left was roughly half the size of current measurements. I think a lot of the deterioration has to do with the prolonged time the patient is on her feet at work 04/19/15 I attempted-like surface debridement bilaterally she does not tolerate this. She tells me that she was in allergic care yesterday with extreme pain over her left lateral malleolus/ankle and was told that she has an "sprain" 05/03/15; large bilateral venous insufficiency wounds over the medial malleolus/medial aspect of her ankles. She complains of copious amounts of drainage and his usual large amounts of pain. There is some increasing erythema around the wound on the right extending into the medial aspect of her foot to. historically she came in with these wounds the right one healed and the left one came down to roughly half its current size however the right one is reopened and the left is expanded. 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 05/10/15 large bilateral venous insufficiency wounds. There is less adherence surface left however the surface culture that I did last week grew pseudomonas therefore bilateral selective debridement score necessary. There is surrounding erythema. The patient describes severe bilateral drainage and a lot of pain in the left ankle. Apparently her podiatrist was were ready to do a cortisone shot 05/17/15; the patient complains of pain and again copious amounts of drainage. 05/24/15; we used Iodo flex last week. Patient notes considerable improvement in wound drainage. Only needed to change this once. 05/31/15; we continued Iodoflex; the base of these large wounds bilaterally is not too bad but there is probably likely a significant bioburden here. I would  like to debridement just doesn't tolerate it. 06/06/14 I would like to continue the Iodoflex although she still hasn't  managed to obtain supplies. She has bilateral medial malleoli or large wounds which are mostly superficial. Both of them are covered circumferentially with some nonviable fibrinous slough although she tolerates debridement very poorly. She apparently has an appointment for an ablation on the right leg by interventional radiology. 06/14/15; the patient arrives with the wounds and static condition. We attempted a debridement although she does not do well with this secondary to pain. I 07/05/15; wounds are not much smaller however there appears to be a cleaner granulating base. The left has tight fibrinous slough greater than the right. Debridement is tolerated poorly due to pain. Iodoflex is done more for these wounds in any of the multitude of different dressings I have tried on the left 1 and then subsequently the right. 07/12/15; no change in the condition of this wound. I am able to do an aggressive debridement on the right but not the left. She simply cannot tolerate it. We have been using Iodoflex which helps somewhat. It is worthwhile remembering that at one point we healed the right medial ankle wound and the left was about 25% of the current circumference. We have suggested returning to vascular surgery for review of possible further ablations for one reason or another she has not been able to do this. 07/26/15 no major change in the condition of either wound on her medial ankle. I did not attempt to debridement of these. She has been aggressively scrubbing these while she is in the shower at home. She has her supply of Iodoflex which seems to have done more for these wounds then anything I have put on recently. 08/09/15 wound area appears larger although not verified by measurements. Using Iodoflex 09/05/2015 -- she was here for avisit today but had significant problems with the wound and I was asked to see her for a physician opinion. I have summarize that this lady has had surgery on her left lower  extremity about 10 years ago where the possible veins stripping was done. She has had an opinion from interventional radiology around November 2016 where no further sclerotherapy was ordered. The patient works 12 hours a day and stands on a concrete floor with work boots and is unable to get the proper compression she requires and cannot elevate her limbs appropriately at any given time. She has recently grown Pseudomonas from her wound culture but has not started her ciprofloxacin which was called in for her. 09/13/15 this continues to be a difficult situation for this patient. At one point I had this wound down to a 1.5 x 1.5" wound on her left leg. This is deteriorated and the right leg has reopened. She now has substantial wounds on her medial calcaneus, malleoli and into her lower leg. One on the left has surface eschar but these are far too painful for me to debridement here. She has a vascular surgery appointment next week to see if anything can be done to help here. I think she has had previous ablations several years ago at [facility]. She has no major edema. She tells me that she did not get product last time [facility] and went for several days without it. She continues to work in work boots 12 hours a day. She cannot get compression/4-layer under her work boots. 09/20/15 no major change. Periwound edema control was not very good. Her point with pain and vascular  is next Wednesday the 25th 09/28/15; the patient is seen vascular surgery and is apparently scheduled for repeat duplex ultrasounds of her bilateral lower legs next week. 10/05/15; the patient was seen by Dr. Doren Custard of vascular surgery. He feels that she should have arterial insufficiency excluded as cause/contributed to her nonhealing stage she is therefore booked for an arteriogram. She has apparently monophasic signals in the dorsalis pedis pulses. She also of course has known severe chronic venous insufficiency with previous  procedures as noted previously. I had another long discussion with the patient today about her continuing to work 12 hour shifts. I've written her out for 2 months area had concerns about this as her work location is currently undergoing significant turmoil and this may lead to her termination. She is aware of this however I agree with her that she simply cannot continue to stand for 12 hours multiple days a week with the substantial wound areas she has. 10/19/15; the Dr. Doren Custard appointment was largely for an arteriogram which was normal. She does not have an arterial issue. He didn't make a comment about her chronic venous insufficiency for which she has had previous ablations. Presumably it was not felt that anything additional could be done. The patient is now out of work as I prescribed 2 weeks ago. Her wounds look somewhat less aggravated presumably because of this. I felt I would give debridement another try today 10/25/15; no major change in this patient's wounds. We are struggling to get her product that she can afford into her own home through her insurance. 11/01/15; no major change in the patient's wounds. I have been using silver alginate as the most affordable product. I spoke to Dr. Marla Roe last week with her requested take her to the OR for surgical debridement and placement of ACEL. Dr. Marla Roe told me that she would be willing to do this however [facility] will not cover this, fortunately the patient has [place] healthcare of some variant 11/08/15; no major change in the patient's wounds. She has been completely nonviable surface that this but is in too much pain with any attempted debridement are clinic. I have arranged for her to see Dr. Marla Roe ham of plastic surgery and this appointment is on Monday. I am hopeful that they will take her to the OR for debridement, possible ACEL ultimately possible skin graft 11/22/15 no major change in the patient's wounds over her  bilateral medial calcaneus medial malleolus into the lower legs. Surface on these does not look too bad however on the left there is surrounding erythema and tenderness. This may be cellulitis or could him sleepy tinea. 11/29/15; no major changes in the patient's wounds over her bilateral medial malleolus. There is no infection here and I don't think any additional antibiotics are necessary. There is now plan to move forward. She sees Dr. Marla Roe in a week's time for preparation for operative debridement and ACEL placement I believe on 7/12. She then has a follow-up appointment with Dr. Marla Roe on 7/21 12/28/15; the patient returns today having been taken to the [facility] by Dr. Marla Roe 12/12/15 she underwent debridement, intraoperative cultures [which were negative]. She had placement of a wound VAC. Parent really ACEL was not available to be placed. The wound VAC foam apparently adhered to the wound since then she's been using silver alginate, Xeroform under Ace wraps. She still says there is a lot of drainage and a lot of pain 01/31/16; this is a patient I see monthly. I  had referred her to Dr. Marla Roe him of plastic surgery for large wounds on her bilateral medial ankles. She has been to the OR twice once in early July and once in early August. She tells me over the last 3 weeks she has been using the wound VAC with ACEL underneath it. On the right we've simply been using silver alginate. Under Kerlix Coban wraps. 02/28/16; this is a patient I'm currently seeing monthly. She is gone on to have a skin graft over her large venous insufficiency ulcer on the left medial ankle. This was done by Dr. Marla Roe him. The patient is a bit perturbed about why she didn't have one on her right medial ankle wound. She has been using silver alginate to this. 03/06/16; I received a phone call from her plastic surgery Dr. Marla Roe. She expressed some concern about the viability of the skin graft she did on the  left medial ankle wound. Asked me to place Endoform on this. She told me she is not planning to do a subsequent skin graft on the right as the left one did not take very well. I had placed Hydrofera Blue on the right 03/13/16; continue to have a reasonably healthy wound on the right medial ankle. Down to 3 mm in terms of size. There is epithelialization here. The area on the left medial ankle is her skin graft site. I suppose the last week this looks somewhat better. She has an open area inferiorly however in the center there appears to be some viable tissue. There is a lot of surface callus and eschar that will eventually need to come off however none of this looked to be infected. Patient states that the is able to keep the dressing on for several days which is an improvement. 03/20/16 no major change in the circumference of either wound however on the left side the patient was at Dr. Eusebio Friendly office and they did a debridement of left wound. 50% of the wound seems to be epithelialized. I been using Endoform on the left Hydrofera Blue in the right 03/27/16; she arrives today with her wound is not looking as healthy as they did last week. The area on the right clearly has an adherent surface to this a very similar surface on the left. Unfortunately for this patient this is all too familiar problem. Clearly the Endoform is not working and will need to change that today that has some potential to help this surface. She does not tolerate debridement in this clinic very well. She is changing the dressing wants 04/03/16; patient arrives with the wounds looking somewhat better especially on the right. Dr. Migdalia Dk change the dressing to silver alginate when she saw her on Monday and also sold her some compression socks. The usefulness of the latter is really not clear and woman with severely draining wounds. 04/10/16; the patient is doing a bit of an experiment wearing the compression stockings that Dr.  Migdalia Dk provided her to her left leg and the out of legs based dressings that we provided to the right. 05/01/16; the patient is continuing to wear compression stockings Dr. Migdalia Dk provided her on the left that are apparently silver impregnated. She has been using Iodoflex to the right leg wound. Still a moderate amount of drainage, when she leaves here the wraps only last for 4 days. She has to change the stocking on the left leg every night 05/15/16; she is now using compression stockings bilaterally provided by Dr. Marla Roe. She is wearing a  nonadherent layer over the wounds so really I don't think there is anything specific being done to this now. She has some reduction on the left wound. The right is stable. I think all healing here is being done without a specific dressing 06/09/16; patient arrives here today with not much change in the wound certainly in diameter to large circular wounds over the medial aspect of her ankle bilaterally. Under the light of these services are certainly not viable for healing. There is no evidence of surrounding infection. She is wearing compression stockings with some sort of silver impregnation as prescribed by Dr. Marla Roe. She has a follow-up with her tomorrow. 06/30/16; no major change in the size or condition of her wounds. These are still probably covered with a nonviable surface. She is using only her purchase stockings. She did see Dr. Marla Roe who seemed to want to apply Dakin's solution to this I'm not extreme short what value this would be. I would suggest Iodoflex which she still has at home. 07/28/16; I follow Mrs. Rachel episodically along with Dr. Marla Roe. 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 She has been applying a topical collagen ointment to both wounds with Adaptic. I don't think Dr. Marla Roe is planning to take her back to the OR. 08/19/16; I follow Mrs. Jeneen Rinks on a  monthly basis along with Dr. Marla Roe of plastic surgery. 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 I been following her for a number of years. At one point I was able to get the right medial malleolus wound to heal and had the left medial malleolus down to about half its current size however and I had to send her to plastic surgery for an operative debridement. Since then things have been stable to slightly improve the area on the right is slightly better one in the left about the same although there is much less adherent surface than I'm used to with this patient. She is using some form of liquid collagen gel that Dr. Marla Roe provided a Kerlix cover with the patient's own pressure stockings. She tells me that she has extreme pain in both ankles and along the lateral aspect of both feet. She has been unable to work for some period of time. She is telling me she is retiring at the beginning of April. She sees Dr. Doran Durand of orthopedics next week 09/22/16; patient has not seen Dr. Marla Roe since the last time she is here. I'm not really sure what she is using to the wounds other than bits and pieces of think she had left over including most recently Hydrofera Blue. She is using juxtalite stockings. She is having difficulty with her husband's recent illness "stroke". She is having to transport him to various doctors appointments. Dr. Marla Roe left her the option of a repeat debridement with ACEL however she has not been able to get the time to follow-up on this. She continues to have a fair amount of drainage out of these wounds with certainly precludes leaving dressings on all week 10/13/16; patient has not seen Dr. Marla Roe since she was last in our clinic. I'm not really sure what she is doing with the wounds, we did try to get her [facility] and I think she is actually using this most of the time. Because of drainage she  states she has to change this every second day although this is an improvement from  what she used to do. She went to see Dr. Doran Durand who did not think she had a muscular issue with regards to her feet, he referred her to a neurologist and I think the appointment is sometime in June. I changed her back to Iodoflex which she has used in the past but not recently. 11/03/16; the patient has been using Iodoflex although she ran out of this. Still claims that there is a lot of drainage although the wound does not look like this. No surrounding erythema. She has not been back to see Dr. Marla Roe 11/24/16; the patient has been using Iodoflex again but she ran out of it 2 or 3 days ago. There is no major change in the condition of either one of these wounds in fact they are larger and covered in a thick adherent surface slough/nonviable tissue especially on the left. She does not tolerate mechanical debridement in our clinic. Going back to see Dr. Marla Roe of plastic surgery for an operative debridement would seem reasonable. 12/15/16; the patient has not been back to see Dr. Marla Roe. She is been dealing with a series of illnesses and her husband which of monopolized her time. She is been using Sorbact which we largely supplied. She states the drainage is bad enough that it maximum she can go 2-3 days without changing the dressing 01/12/2017 -- the patient has not been back for about 4 weeks and has not seen Dr. Marla Roe not does she have any appointment pending. 01/23/17; patient has not seen Dr. Marla Roe even though I suggested this previously. She is using Santyl that was suggested last week by Dr. Con Memos this Cost her $16 through her insurance which is indeed surprising 02/12/17; continuing Santyl and the patient is changing this daily. A lot of drainage. She has not been back to see plastic surgery she is using an Ace wrap. Our intake nurse suggested wrap around stockings which would make a good  reasonable alternative 02/26/17; patient is been using Santyl and changing this daily due to drainage. She has not been to see plastic surgery she uses in April Ace wrap to control the edema. She did obtain extremitease stockings but stated that the edema in her leg was to big for these 03/20/17; patient is using Santyl and Anasept. Surfaces looked better today the area on the right is actually measuring a little smaller. She has states she has a lot of pain in her feet and ankles and is asking for a consult to pain control which I'll try to help her with through our case manager. 04/10/17; the patient arrives with better-looking wound surfaces and is slightly smaller wound on the left she is using a combination of Santyl and Anasept. She has an appointment or at least as started in the pain control center associated with [facility] 05/14/17; this is a patient who I followed for a prolonged period of time. She has venous insufficiency ulcers on her bilateral medial ankles. At one point I had this down to a much smaller wound on the left however these reopened and we've never been able to get these to heal. She has been using Santyl and Anasept gel although 2 weeks ago she ran out of the Anasept gel. She has a stable appearance of the wound. She is going to the wound care clinic at [facility]. They wanted do a nerve block/spinal block although she tells me she is reluctant to go forward with that. 05/21/17; this is a patient I have followed  for many years. She has venous insufficiency ulcers on her bilateral medial ankles. Chronic pain and deformity in her ankles as well. She is been to see plastic surgery as well as orthopedics. Using PolyMem AG most recently/Kerramax/ABDs and 2 layer compression. She has managed to keep this on and she is coming in for a nurse check to change the dressing on Tuesdays, we see her on Fridays 06/05/17; really quite a good looking surface and the area especially  on the right medial has contracted in terms of dimensions. Well granulated healthy-looking tissue on both sides. Even with an open curet there is nothing that even feels abnormal here. This is as good as I've seen this in quite some time. We have been using PolyMem AG and bringing her in for a nurse check 06/12/17; really quite good surface on both of these wounds. The right medial has contracted a bit left is not. We've been using PolyMem and AG and she is coming in for a nurse visit 06/19/17; we have been using PolyMem AG and bringing her in for a nurse check. Dimensions of her wounds are not better but the surfaces looked better bilaterally. She complained of bleeding last night and the left wound and increasing pain bilaterally. She states her wound pain is more neuropathic than just the wounds. There was some suggestion that this was radicular from her pain management doctor in talking to her it is really difficult to sort this out. 06/26/17; using PolyMem and AG and bringing her in for a nurse check as All of this and reasonably stable condition. Certainly not improved. The dimensions on the lateral part of the right leg look better but not really measuring better. The medial aspect on the left is about the same. 07/03/16; we have been using PolyMen AG and bringing her in for a nurse check to change the dressings as the wounds have drainage which precludes once weekly changing. We are using all secondary absorptive dressings.our intake nurse is brought up the idea of using a wound VAC/snap VAC on the wound to help with the drainage to see if this would result in some contraction. This is not a bad idea. The area on the right medial is actually looking smaller. Both wounds have a reasonable-looking surface. There is no evidence of cellulitis. The edema is well controlled 07/10/17; the patient was denied for a snap VAC by her insurance. The major issue with these wounds continues to be drainage. We are  using wicked PolyMem AG and she is coming in for a nurse visit to change this. The wounds are stable to slightly improved. The surface looks vibrant and the area on the right certainly has shrunk in size but very slowly 07/17/17; the patient still has large wounds on her bilateral medial malleoli. Surface of both of these wounds looks better. The dimensions seem to come and go but no consistent improvement. There is no epithelialization. We do not have options for advanced treatment products due to insurance issues. They did not approve of the wound VAC to help control the drainage. More recently we've been using PolyMem and AG wicked to allow drainage through. We have been bringing her in for a nurse visit to change this. We do not have a lot of options for wound care products and the home again due to insurance issues 07/24/17; the patient's wound actually looks somewhat better today. No drainage measurements are smaller still healthy-looking surface. We used silver collagen under PolyMen started last week.  We have been bringing her in for a dressing change 07/31/17; patient's wound surface continued to look better and I think there is visible change in the dimensions of the wound on the right. Rims of epithelialization. We have been using silver collagen under PolyMen and bringing her in for a dressing change. There appears to be less drainage although she is still in need of the dressing change 08/07/17. Patient's wound surface continues to look better on both sides and the area on the right is definitely smaller. We have been using silver collagen and PolyMen. She feels that the drainage has been it has been better. I asked her about her vascular status. She went to see Dr. Aleda Grana at [facility] and had some form of ablation. I don't have much detail on this. I haven't my notes from 2016 that she was not a candidate for any further ablation but I don't have any more information on this. We had  referred her to vein and vascular I don't think she ever went. He does not have a history of PAD although I don't have any information on this either. We don't even have ABIs in our record 08/14/17; we've been using silver collagen and PolyMen cover. And putting the patient and compression. She we are bringing her in as a nurse visit to change this because ofarge amount of drainage. We didn't the ABIs in clinic today since they had been done in many moons 1.2 bilaterally. She has been to see vein and vascular however this was at [facility] and she had ablation although I really don't have any information on this all seemed biking get a report. She is also been operatively debrided by plastic surgery and had a cell placed probably 8-12 months ago. This didn't have a major effect. We've been making some gains with current dressings 08/19/17-She is here in follow-up evaluation for bilateral medial malleoli ulcers. She continues to tolerate debridement very poorly. We will continue with recently changed topical treatment; if no significant improvement may consider switching to Iodosorb/Iodoflex. 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 She has been using silver collagen and PolyMem. I believe she has been used and tried on Iodoflex before. 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 These have been very refractory over time. We have been using silver collagen and PolyMen. She is coming in once a week for a doctor's and once a week for nurse visits. We are actually making some progress 09/18/17; the patient's wounds are smaller especially on the right medial. She arrives today to upset to consider even washing these off with Anasept which I think is been part of the  reason this is been closing. We've been using collagen covered in PolyMen otherwise. It is noted that she has a small area of folliculitis on the right medial calf that. As we are wrapping her legs I'll give her a short course of doxycycline to make sure this doesn't amount to anything. She is a long list of complaints today including imbalance, shortness of breath on exertion, inversion of her left ankle. With regards to the latter complaints she is been to see orthopedics and they offered her a tendon release surgery I believe but wanted her wounds to be closed first. I have recommended she  go see her primary physician with regards to everything else. 09/25/17; patient's wounds are about the same size. We have made some progress bilaterally although not in recent weeks. She will not allow me T wash these wounds with Anasept even if she is doing her cell. Wheeze we've been using collagen covered in PolyMen. Last week she had a small area of folliculitis this is now opened into a small wound. She completed 5 days of trimethoprim sulfamethoxazole 10/02/17; unfortunately the area on her left medial ankle is worse with a larger wound area towards the Achilles. The patient complains of a lot of pain. She will not allow debridement although visually I don't think there is anything to debridement in any case. We have been using silver collagen and PolyMen for several months now. Initially we are making some progress although I'm not really seeing that today. We will move back to [facility]. His admittedly this is a bit of a repeat however I'm hoping that his situation is different now. The patient tells me she had her leg on the left give out on her yesterday this is process some pain. 10/09/17; the patient is seen twice a week largely because of drainage issues coming out of the chronic medial bimalleolar wounds that are chronic. Last week the dimensions of the one on the left looks a little larger I  changed her to [facility]. She comes in today with a history of terrible pain in the bilateral wound areas. She will not allow debridement. She will not even allow a tissue culture. There is no surrounding erythema no no evidence of cellulitis. We have been putting her Kerlix Coban man. She will not allow more aggressive compression as there was a suggestion to put her in 3 layer wraps. 10/16/17; large wounds on her bilateral medial malleoli. These are chronic. Not much change from last week. The surface looks have healthy but absolutely no epithelialization. A lot of pain little less so of drainage. She will not allow debridement or even washing these off in the vigorous fashion with Anasept. 10/23/17; large wounds on her bilateral malleoli which are chronic. Some improvement in terms of size perhaps on the right since last time I saw these. She states that after we increased the 3 layer compression there was some bleeding, when she came in for a nurse visit she did not want 3 layer compression put back on about our nurse managed to convince her. She has known chronic venous visit issues and I'm hoping to get her to tolerate the 3 layer compression. using Hydrofera Blue 10/30/17; absolutely no change in the condition of either wound although we've had some improvement in dimensions on the right.. Attempted to put her in 3 layer compression she didn't tolerated she is back in 2 layer compression. We've been using Hydrofera Blue We looked over her past records. She had venous reflux studies in November 2016. There was no evidence of deep venous reflux on the right. Superficial vein did not show the greater saphenous vein at think this is been previously ablated the small saphenous vein was within normal limits. The left deep venous system showed no DVT the vessels were positive for deep venous reflux in the posterior tibial veins at the ankle. The greater saphenous vein was surgically absent small  saphenous vein was within normal limits. She went to vein and vascular at [facility]. I believe she had an ablation on the left greater saphenous vein. I'll update her reflux studies  perhaps ever reviewed by vein and vascular. We've made absolutely no progress in these wounds. Will also try to read and TheraSkins through her insurance 11/06/17; W the patient apparently has a 2 week follow-up with vein and vascular I like him to review the whole issue with regards to her previous vascular workup by Dr. Aleda Grana. We've really made no progress on these wounds in many months. She arrives today with less viable looking surface on the left medial ankle wound. This was apparently looking about the same on Tuesday when she was here for nurse visit. 11/13/17; deep tissue culture I did last time of the left lower leg showed multiple organisms without any predominating. In particular no Staphylococcus or group A strep were isolated. We sent her for venous reflux studies. She's had a previous left greater saphenous vein stripping and I think sclerotherapy of the right greater saphenous vein. She didn't really look at the lesser saphenous vein this both wounds are on the medial aspect. She has reflux in the common femoral vein and popliteal vein and an accessory vein on the right and the common femoral vein and popliteal vein on the left. I'm going to have her go to see vein and vascular just the look over things and see if anything else beside aggressive compression is indicated here. We have not been able to make any progress on these wounds in spite of the fact that the surface of the wounds is never look too bad. 11/20/17; no major change in the condition of the wounds. Patient reports a large amount of drainage. She has a lot of complaints of pain although enlisting her today I wonder if some of this at least his neuropathic rather than secondary to her wounds. She has an appointment with vein and vascular  on 12/30/17. The refractory nature of these wounds in my mind at least need vein and vascular to look over the wounds the recent reflux studies we did and her history to see if anything further can be done here. I also note her gait is deteriorated quite a bit. 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 She has a bilateral Trendelenburg gait. I wonder if this is neuropathic or perhaps multilevel radicular. 11/27/17; her wounds actually looks slightly better. Healthy-looking granulation tissue a scant amount of epithelialization. [place] healthcare will not pay for [employer]. They will play for tri layer Oasis and Dermagraft. This is not a diabetic ulcer. We'll try for the tri layer Oasis. She still complains of some drainage. She has a vein and vascular appointment on 12/30/17 12/04/17; the wounds visually look quite good. Healthy-looking granulation with some degree of epithelialization. We are still waiting for response to our request for trial to try layer Oasis. Her appointment with vascular to review venous and arterial issues isn't sold the end of July 7/31. Not allow debridement or even vigorous cleansing of the wound surface. 12/18/17; slightly smaller especially on the right. 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 We've been using Hydrofera Blue. We still are looking into triple layer Oasis through her insurance 01/08/18 on evaluation today patient's wound actually appears to be showing signs of good improvement at this point in time. She has been tolerating the dressing changes without complication. Fortunately there does not appear to be any evidence of infection at this point in time. We have been utilizing silver nitrate which does seem to be of benefit for her which is also good news. Overall I'm very  happy with how things seem to be both regards appearance as well as measurement. Patient did see Dr. Bridgett Larsson for evaluation on 12/30/17. In his assessment he  felt that stripping would not likely add much more than chronic compression to the patient's healing process. His recommendation was to follow-up in three months with Dr. Doren Custard if she hasn't healed in order to consider referral back to you and see vascular where she previously was in a trial and was able to get her wound to heal. I'll be see what she feels she when you staying compression and he reiterated this as well. 01/13/18 on evaluation today patient appears to actually be doing very well in regard to her bilateral medial malleolus ulcers. She seems to have tolerated the chemical cauterization with silver nitrate last week she did have some pain through that evening but fortunately states that I'll be see since it seems to be doing better she is overall pleased with the progress. 01/21/18; really quite a remarkable improvement since I've last seen these wounds. We started using silver nitrate specially on the islands of hyper granulation which for some reason her around the wound circumference. This is really done quite nicely. Primary dressing Hydrofera Blue under 4 layer compression. She seems to be able to hold out without a nurse rewrap. Follow-up in 1 week 01/28/18; we've continued the hydrofera blue but continued with chemical cauterization to the wound area that we started about a month ago for irregular hyper granulation. She is made almost stunning improvement in the overall wound dimensions. I was not really expecting this degree of improvement in these chronic wounds 02/05/18; we continue with Hydrofera Bluebut of also continued the aggressive chemical cauterization with silver nitrate. We made nice progress with the right greater than left wound. 02/12/18. We continued with Hydrofera Blue after aggressive chemical cauterization with silver nitrate. We appear to be making nice progress with both wound areas 02/19/2018; we continue with [facility] after washing the wounds vigorously  with Anasept spray and chemical cauterization with silver nitrate. We are making excellent progress. The area on the right's just about closed 02/26/2018. The area on the left medial ankle had too much necrotic debris today. I used a #5 curette we are able to get most of the soft. I continued with the silver nitrate to the much smaller wound on the right medial ankle she had a new area on her right lower pretibial area which she says was due to a role in her compression 03/05/2018; both wound areas look healthy. Not much change in dimensions from last week. I continue to use silver nitrate and Hydrofera Blue. The patient saw Dr. Doren Custard of vein and vascular. He felt she had venous stasis ulcers. He felt based on her previous arteriogram she should have adequate circulation for healing. Also she has deep venous reflux but really no significant correctable superficial venous reflux at this time. He felt we should continue with conservative management including leg elevation and compression 04/02/2018; since we last saw this woman about a month ago she had a fall apparently suffered a pelvic fracture. I did not look up the x-ray. Nevertheless because of pain she literally was bedbound for 2 weeks and had home health coming out to change the dressing. Somewhat predictably this is resulted in considerable improvement in both wound areas. The right is just about closed on the medial malleolus and the left is about half the size. 04/16/2018; both her wounds continue to go down in  size. Using Hydrofera Blue. 05/07/18; both her wounds appeared to be improving especially on the right where it is almost closed. We are using Hydrofera Blue 05/14/2018; slightly worse this week with larger wounds. Surface on the left medial not quite as good. We have been using Hydrofera Blue 05/21/18; again the wounds are slightly larger. Left medial malleolus slightly larger with eschar around the circumference. We have been using  Hydrofera Blue undergoing a wraps for a prolonged period of time. This got a lot better when she was more recumbent due to a fall and a back injury. I change the primary dressing the silver alginate today. She did not tolerate a 4 layer compression previously although I may need to bring this up with her next time 05/28/2018; area on the left medial malleolus again is slightly larger with more drainage. Area on the right is roughly unchanged. She has a small area of folliculitis on the right medial just on the lower calf. This does not look ominous. 06/03/2018 left medial malleolus slightly smaller in a better looking surface. We used silver nitrate on this last time with silver alginate. The area on the right appears slightly smaller 1/10; left medial malleolus slightly smaller. Small open area on the right. We used silver nitrate and silver alginate as of 2 weeks ago. We continue with the wound and compression. These got a lot better when she was off her feet 1/17; right medial malleolus wound is smaller. The left may be slightly smaller. Both surfaces look somewhat better. 1/24; both wounds are slightly smaller. Using silver alginate under Unna boots 1/31; both wounds appear smaller in fact the area on the right medial is just about closed. Surface eschar. We have been using silver alginate under Unna boots. The patient is less active now spends let much less time on her feet and I think this is contributed to the general improvement in the wound condition 2/7; both wounds appear smaller. I was hopeful the right medial would be closed however there there is still the same small open area. Slight amount of surface eschar on the left the dimensions are smaller there is eschar but the wound edges appear to be free. We have been using silver alginate under Unna boot's 2/14; both wounds once again measure smaller. Circumferential eschar on the left medial. We have been using silver alginate under Unna  boots with gradual improvement 2/21; the area on the right medial malleolus has healed. The area on the left is smaller. We have been using silver alginate and Unna boots. We can discharge wrapping the right leg she has 20/30 stockings at home she will need to protect the scar tissue in this area 2/28; the area on the right medial malleolus remains closed the patient has a compression stocking. The area on the left is smaller. We have been using silver alginate and Unna boots. 3/6 the area on the right medial ankle remains closed. Good edema control noted she is using her own compression stocking. The area on the left medial ankle is smaller. We have been managing this with silver alginate and Unna boots which we will continue today. 3/13; the area on the right medial ankle remains closed and I'm declaring it healed today. When necessary the left is about the same still a healthy-looking surface but no major change and wound area. No evidence of infection and using silver alginate under unna and generally making considerable improvement 3/27 the area on the right medial ankle remains closed  the area on the left is about the same as last week. Certainly not any worse we have been using silver alginate under an Unna boot 4/3; the area on the right medial ankle remains closed per the patient. We did not look at this wound. The wound on the left medial ankle is about the same surface looks healthy we have been using silver alginate under an Unna boot 4/10; area on the right medial ankle remains closed per the patient. We did not look at this wound. The wound on the left medial ankle is slightly larger. The patient complains that the [facility] caused burning pain all week. She also told us that she was a lot more active this week. Changed her back to silver alginate 4/17; right medial ankle still closed per the patient. Left medial ankle is slightly larger. Using silver alginate. She did not  tolerate Hydrofera Blue on this area 4/24; right medial ankle remains closed we have not look at this. The left medial ankle continues to get larger today by about a centimeter. We have been using silver alginate under Unna boots. She complains about 4 layer compression as an alternative. She has been up on her feet working on her garden 5/8; right medial ankle remains closed we did not look at this. The left medial ankle has increased in size about 100%. We have been using silver alginate under Unna boots. She noted increased pain this week and was not surprised that the wound is deteriorated 5/15; no major change in SA however much less erythema ( one week of doxy ocellulitis). 5/22-67 year old female returns at 1 week to the clinic for left medial ankle wound for which we have been using silver alginate under 3 layer compression She was placed on DOXY at last visit - the wound is wider at this visit. She is in 3 layer compression 5/29; change to [facility] last week. I had given her empiric doxycycline 2 weeks ago for a week. She is in 3 layer compression. She complains of a lot of pain and drainage on presentation today. 6/5; using Hydrofera Blue. I gave her doxycycline recently empirically for erythema and pain around the wound. Believe her cultures showed enterococcus which not would not have been well covered by doxycycline nevertheless the wound looks better and I don't feel specifically that the enterococcus needs to be covered. She has a new what looks like a wrap injury on her lateral left ankle. 6/12; she is using Hydrofera Blue. She has a new area on the left anterior lower tibial area. This was a wrap injury last week. 6/19; the patient is using Hydrofera Blue. She arrived with marked inflammation and erythema around the wound and tenderness. 12/01/18 on evaluation today patient appears to be doing a little bit better based on what I'm hearing from the standpoint of lassos evaluation  to this as far as the overall appearance of the wound is concerned. Then sometime substandard she typically sees Dr. Dellia Nims. Nonetheless overall very pleased with the progress that she's made up to this point. No fevers, chills, nausea, or vomiting noted at this time. 7/10; some improvement in the surface area. Aggressively debrided last week apparently. I went ahead with the debridement today although the patient does not tolerate this very well. We have been using Iodoflex. Still a fair amount of drainage 7/17; slightly smaller. Using Iodoflex. 7/24; no change from last week in terms of surface area. We have been using Iodoflex. Surface looks and  continues to look somewhat better 7/31; surface area slightly smaller better looking surface. We have been using Iodoflex. This is under Unna boot compression 8/7-Patient presents at 1 week with Unna boot and Iodoflex, wound appears better 8/14-Patient presents at 1 week with Iodoflex, we use the Unna boot, wound appears to be stable better.Patient is getting Botox treatment for the inversion of the foot for tendon release, Next week 8/21; we are using Iodoflex. Unna boot. The wound is stable in terms of surface area. Under illumination there is some areas of the wound that appear to be either epithelialized or perhaps this is adherent slough at this point I was not really clear. It did not wipe off and I was reluctant to debride this today. 8/28; we are using Iodoflex in an Unna boot. Seems to be making good improvement. 9/4; using Iodoflex and wound is slightly smaller. 9/18; we are using Iodoflex with topical silver nitrate when she is here. The wound continues to be smaller 10/2; patient missed her appointment last week due to GI issues. She left and Iodoflex based dressing on for 2 weeks. Wound is about the same size about the size of a dime on the left medial lower 10/9 we have been using Iodoflex on the medial left ankle wound. She has a new  superficial probable wrap injury on the dorsal left ankle 10/16; we have been using Hydrofera Blue since last week. This is on the left medial ankle 10/23; we have been using Hydrofera Blue since 2 weeks ago. This is on the left medial ankle. Dimensions are better 11/6; using Hydrofera Blue. I think the wound is smaller but still not closed. Left medial ankle 11/13; we have been using Hydrofera Blue. Wound is certainly no smaller this week. Also the surface not as good. This is the remanent of a very large area on her left medial ankle. 11/20; using Sorbact since last week. Wound was about the same in terms of size although I was disappointed about the surface debris 12/11; 3-week follow-up. Patient was on vacation. Wound is measuring slightly larger we have been using Sorbact. 12/18; wound is about the same size however surface looks better last week after debridement. We have been using Sorbact under compression 1/15 wound is probably twice the size of last time increased in length nonviable surface. We have been using Sorbact. She was running a mild fever and missed her appointment last week 1/22; the wound is come down in size but under illumination still a very adherent debris we have been Hydrofera Blue that I changed her to last week 1/29; dimensions down slightly. We have been using Hydrofera Blue 2/19 dimensions are the same however there is rims of epithelialization under illumination. Therefore more the surface area may be epithelialized 2/26; the patient's wound actually measures smaller. The wound looks healthy. We have been using Hydrofera Blue. I had some thoughts about running Apligraf then I still may do that however this looks so much better this week we will delay that for now 3/5; the wound is small but about the same as last week. We have been using Hydrofera Blue. No debridement is required today. 3/19; the wound is about the size of a dime. Healthy looking wound even under  illumination. We have been using Hydrofera Blue. No mechanical debridement is necessary 3/26; not much change from last week although still looks very healthy. We have been using Hydrofera Blue under Unna boots Patient was offered an ankle fusion by podiatry  but not until the wound heals with a proceed with this. 4/9; the patient comes in today with her original wound on the medial ankle looking satisfactory however she has some uncontrolled swelling in the middle part of her leg with 2 new open areas superiorly just lateral to the tibia. I think this was probably a wrap issue. She said she felt uncomfortable during the week but did not call in. We have been using Hydrofera Blue 4/16; the wound on the medial ankle is about the same. She has innumerable small areas superior to this across her mid tibia. I think this is probably folliculitis. She is also been working in the yard doing a lot of sweating 4/30; the patient issue on the upper areas across her mid tibia of all healed. I think this was excessive yard work if I remember. Her wound on the medial ankle is smaller. Some debris on this we have been using Hydrofera Blue under Unna boots 5/7; mid tibia. She has been using Hydrofera Blue under an Unna wrap. She is apparently going for her ankle surgery on June 3 10/28/19-Patient returns to clinic with the ankle wound, we are using Hydrofera Blue under Unna wrap, surgery is scheduled for her left foot for June 3 so she will be back for nurse visit next week READMISSION 01/17/2020 Mrs. Gorczyca is a 66 year old woman we have had in this clinic for a long period of time with severe venous hypertension and refractory wounds on her medial lower legs and ankles bilaterally. 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 When she was off her legs for a prolonged period example when she fell  and suffered a compression fracture things would heal up quite nicely. She is now retired and we managed to heal up the right medial leg wound. The left one was very tiny last time I saw this although still refractory. 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 It got to the point where this was interfering with her walking and she elected to proceed with a ankle arthrodesis to straighten her her ankle and leave her with a functional outcome for mobilization. The patient was referred to Dr. Doren Custard and really this took some time to arrange. Dr. Doren Custard saw her on 12/07/2019. Once again he verified that she had no arterial issues. She had previously had an angiogram several years ago. Follow-up ABIs on the left showed an ABI of 1.12 with triphasic waveforms and a TBI of 0.92. She is felt to have chronic deep venous insufficiency but I do not think it was felt that anything could be done from about this from an ablation point of view. At the time Dr. Doren Custard saw this patient the wounds actually look closed via the pictures in his clinic. The patient finally underwent her surgery on 12/15/2019. This went reasonably well and there was a good anatomic outcome. She developed a small distal wound dehiscence on the lateral part of the surgical wound. However more problematically she is developed recurrence of the wound on the medial left ankle. There are actually 2 wounds here one in the distal lower leg and 1 pretty much at the level of the medial malleolus. It is a more distal area that is more problematic. She has been using Hydrofera Blue which  started on Friday before this she was simply Ace wrapping. There was a culture done that showed Pseudomonas and she is on ciprofloxacin. A recent CNS on 8/11 was negative. The patient reports some pain but I generally think this is improving. She is using a cam boot completely  nonweightbearing using a walker for pivot transfers and a wheelchair 8/24; not much improvement unfortunately she has a surgical wound on the lateral part in the venous insufficiency wound medially. The bottom part of the medial insufficiency wound is still necrotic there is exposed tendon here. We have been using Hydrofera Blue under compression. Her edema control is however better 8/31; patient in for follow-up of his surgical wound on the lateral part of her left leg and chronic venous insufficiency ulcers medially. We put her back in compression last week. She comes in today with a complaint of 3 or 4 days worth of increasing pain. She felt her cam walker was rubbing on the area on the back of her heel. However there is intense erythema seems more likely she has cellulitis. She had 2 cultures done when she was seeing podiatry in the postop. One of them in late July showed Pseudomonas and she received a course of ciprofloxacin the other was negative on 8/11 she is allergic to penicillin with anaphylactoid complaints of hives oral swelling via information in epic 9/9; when I saw this patient last week she had intense anterior erythema around her wound on the right lateral heel and ankle and also into the right medial heel. Some of this was no doubt drainage and her walker boot however I was convinced she had cellulitis. I gave her Levaquin and Bactrim she is finishing up on this now. She is following up with Dr. [name] he saw her yesterday. He is taken her out of the walking boot of course she is still nonweightbearing. Her x-ray was negative for any worrisome features such as soft tissue air etc. Things are a lot better this week. She has home health. We have been using Hydrofera Blue under an [employer] which she put back on yesterday. I did not wrap her last week 9/17; her surrounding skin looks a lot better. In fact the area on the left lateral ankle has just a scant amount of eschar. The only  remaining wound is the large area on the left medial ankle. Probably about 60% of this is healthy granulation at the surface however she has a significant divot distally. This has adherent debris in it. I been using debridement and silver collagen to try and get this area to fill-in although I do not think we have made much progress this week 9/24; the patient's wound on the left medial ankle looks a lot better. The deeper divot area distally still requires debridement but this is cleaning up quite nicely we have been using silver collagen. The patient is complaining of swelling in her foot and is worried that that is contributing to the nonhealing of the ankle wound. She is also complaining of numbness in her anterior toes 10/4; left medial ankle. The small area distally still has a divot with necrotic material that I have been debriding away. This has an undermining area. She is approved for Apligraf. She saw Dr. [name] her surgeon on 10/1. I think he declared himself is satisfied with the condition of things. Still nonweightbearing till the end of the month. 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 Her surgical wound is  well closed. There is no evidence of infection 10/11; the patient arrived in clinic today with the expectation that we be able to put an Apligraf on this area after debridement however she arrives with a relatively large amount of green drainage on the dressing. The patient states that this started on Friday. She has not been systemically unwell. 10/19; culture I did last week showed both Enterococcus and Pseudomonas. I think this came in separate parts because I stopped her ciprofloxacin I gave her and prescribed her linezolid however now looking at the final culture result this is Pseudomonas which is resistant to quinolones. She has not yet picked up the linezolid apparently phone issues. We are also trying to get a topical antibiotic out of  [place] in [place] they can be applied by home health. She is still having green drainage 10/16; the patient has her topical antibiotic from [facility] in [place]. This is a compounded gel with vancomycin and ciprofloxacin and gentamicin. We are applying this on the wound bed with silver alginate over the top with Unna boot wraps. She arrives in clinic today with a lot less ominous looking drainage although she is only use this topical preparation once the second time today. She sees Dr. [name] her surgeon on Friday she has home health changing the dressing 11/2; still using her compounded topical antibiotic under silver alginate. Surface is cleaning up there is less drainage. We had an Apligraf for her today and I elected to apply it. A light coating of her antibiotic 04/25/2020 upon evaluation today patient appears to be doing well with regard to her ankle ulcer. There is a little bit of slough noted on the surface of the wound I am can have to perform sharp debridement to clear this away today. With that being said other than that fact overall I feel like she is making progress and we do see some new epithelial growth. There is also some improvement in the depth of the wound and that distal portion. There is little bit of slough there as well. 12/7; 2-week follow-up. Apligraf #3. Dimensions are smaller. Closing in especially inferiorly. Still some surface debris. Still using the [facility] topical antibiotic but I told her that I don't think this needs to be renewed 12/21; 2-week follow-up. Apligraf #4 dimensions are smaller. Nice improvement 06/05/2020; 2-week follow-up. The patient's wound on the left medial ankle looks really excellent. Nice granulation. Advancing epithelialization no undermining no evidence of infection. We would have to reapply for another Apligraf but with the condition of this wound I did not feel strongly about it. We used Hydrofera Blue under the same degree  of compression. She follows up with Dr. [name] her surgeon a week Friday 06/13/2020 upon evaluation today patient appears to be doing excellent in regard to her wound. She has been tolerating the dressing changes without complication. Fortunately there is no signs of active infection at this time. No fevers, chills, nausea, vomiting, or diarrhea. She was using Hydrofera Blue last week. 06/20/2020 06/20/2020 on evaluation today patient actually appears to be doing excellent in regard to her wound. This is measuring better and looking much better as well. She has been using the collagen that seems to be doing better for her as well even though the [facility] was and is not sticking or feeling as rough on her wound. She did see Dr. [name] on Friday he is very pleased he also stated none of the hardware has shifted. That is great news  1/27; the patient has a small clean wound all that is remaining. I agree that this is too small to really consider an Apligraf. Under illumination the surface is looking quite good. We have been using collagen although the dimensions are not any better this week 2/2; the patient has a small clean wound on the left medial ankle. Although this left of her substantial original areas. Measurements are smaller. We have been using polymen Ag under an Haematologist. 2/10; small area on the left medial ankle. This looks clean nothing to debride however dimensions are about the same we have been using polymen I think now for 2 weeks 2/17; not much change in surface area. We have been using polymen Ag without any improvement. 3/17; 1 month follow-up. The patient has been using endoform without any improvement in fact I think this looks worse with more depth and more expansion 3/24; no improvement. Perhaps less debris on the surface. We have been using Sorbact for 1 week 4/4; wound measures larger. She has edema in her leg and her foot which she tells as her wrap came down. We have been  using Unna boots. Sorbact of the wound. She has been approved for Apligraf 09/12/2020 upon evaluation today patient appears to be doing well with regard to her wound. We did get the Apligraf reapproved which is great news we have that available for application today. Fortunately there is no signs of infection and overall the patient seems to be doing great. The wound bed is nice and clean. 4/27; patient presents for her second application of Apligraf. She states over the past week she has been on her feet more often due to being outside in her garden. She has noted more swelling to her foot as a result. She denies increased warmth, pain or erythema to the wound site. 10/10/2020 upon evaluation today patient appears to be doing well with regard to her wound which does not appear to be quite as irritated as last week from what I am hearing. With that being said unfortunately she is having issues with some erythema and warmth to touch as well as an increase size. I do believe this likely is infected. 10/17/2020 upon evaluation today patient appears to be doing excellent in regard to her wound this is significantly improved as compared to last week. Fortunately I think that the infection is much better controlled at this point. She did have evidence of both Enterococcus as well as Staphylococcus noted on culture. Enterococcus really would not be helped significantly by the Cipro but the wound is doing so much better I am under the assumption that the Staphylococcus is probably the main organism that is causing the current infection. Nonetheless I think that she is doing excellent as far as that is concerned and I am very pleased in that regard. I would therefore recommend she continue with the Cipro. 10/31/2020 upon evaluation today patient appears to be doing well with regard to her wound. She has been tolerating the dressing changes without complication. Fortunately there is no signs of active infection and  overall I am extremely pleased with where things stand today. No fevers, chills, nausea, vomiting, or diarrhea. With that being said she does have some green drainage coming from the wound and although it looks okay I am a little concerned about the possibility of a continuing infection. Specifically with Pseudomonas. For that reason I will go ahead and send in a prescription for Cipro for her to be continued. 11/14/2020  upon evaluation today patient appears to be doing very well currently in regard to her wound on her leg. She has been tolerating the dressing changes without complication. Fortunately I feel like the infection is finally under good control here. Unfortunately we do not have the Apligraf for application today although we can definitely order to have it in place for next week. That will be her fifth and final of the current series. Nonetheless I feel like her wound is really doing quite well which is great news. 11/21/2020 upon evaluation today patient appears to be doing well with regard to her wound on the medial ankle. Fortunately I think the infection is under control and I do believe we can go ahead and reapply the Apligraf today. She is in agreement with that plan. There does not appear to be any signs of active infection at this time which is great news. No fevers, chills, nausea, vomiting, or diarrhea. 12/05/2020 upon evaluation today patient's wound bed actually showed signs of good granulation epithelization at this point. There does not appear to be any signs of infection which is great news and overall very pleased with where things stand. Overall the patient seems to be doing fairly well in my opinion with regard to her wound although I do believe she continues to build up a lot of biofilm I think she could benefit from using PuraPly at this point. 12/12/2020 upon evaluation today patient's wound actually appears to be doing decently well today. The Unna boot has not been quite as  well-tolerated so that more uncomfortable for her and even causing some pressure over the plate on the lateral portion of her foot which is 90 where the wound is. There did not appear to be any significant deep tissue injury with that there may be a minimal change in the skin noted I think that we may want to go back to the Coflex 2 layer which is a little bit easier on her skin it seems. 12/19/2020 upon evaluation today patient actually seems to be making great progress with the PuraPly currently. She in fact seems to be much better as far as the overall appearance of the wound bed is concerned I am very happy in this regard. I do not see any signs of of infection which is great news as well. No fevers, chills, nausea, vomiting, or diarrhea. 12/26/2020 upon evaluation today patient appears to actually be doing better in regard to her wound on the left medial ankle region. The surface of the wound is actually doing significantly better which is great news. There does not appear to be any signs of infection which is also great news and in general I am extremely pleased with where we stand today. 01/02/2021 upon evaluation today patient appears to be doing well with regard to her wound. In fact this is showing signs of excellent improvement and very pleased with where things stand. In fact the last 3 appointments have all shown signs of this getting smaller which is excellent news. I have not even had to perform any debridement and today is no exception. Overall I feel like this is dramatically improved compared to previous. T oday is PuraPly application #4. 10/27/7822 upon evaluation today patient appears to be doing excellent in regard to her wound this is continue to show signs of improvement and overall I am extremely pleased with where we stand today. She is actually here for PuraPly application #5. Every time we have applied this she is noted  definite improvement on measurements. 01/23/2021 upon  evaluation today patient is actually making good progress in regard to her wound. This was actually on just a little bit longer this time compared to previous due to the fact that she did have to go out of town. She is actually here for PuraPly application #6. We have definitely been seeing improvements in the overall quality of the tissue on the surface of the wound which is awesome news. In general I think that the patient seems to be continuing to make great progress here. 01/30/2021 upon evaluation today patient's wound is actually doing excellent. There is really not any significant biofilm buildup which is great news and overall I am extremely pleased with where things stand today. There does not appear to be any signs of active infection. No fevers, chills, nausea, vomiting, or diarrhea. 02/06/2021 upon evaluation today patient's wound is actually showing signs of excellent improvement which is great news. We continue to see the benefit of the PuraPly this is doing a great job the wound seems not really irritated whatsoever and is showing signs of good granulation at this point. Overall I am extremely happy with what we are seeing. The patient likewise is happy to hear all of this as well. 02/13/2021 upon evaluation today patient appears to be doing well with regard to her wound. This again is measuring smaller today and I am very pleased in this regard. Fortunately there does not appear to be any signs of active infection at this time which is good news from a systemic standpoint. Locally there is still some significant drainage which she does have is concerned about infection locally. No fevers, chills, nausea, vomiting, or diarrhea. 02/20/2021 upon evaluation today patient actually appears to be doing decently well in regard to her wound. She has been tolerating the dressing changes without complication. I do believe the PuraPly is helping wound bed does appear to be doing better. There is no  evidence of active infection locally or systemically at this point visually although on fluorescence imaging there still appear to be bacterial load and bioburden noted. 02/27/2021 upon evaluation today patient fortunately continues to show signs of improvement with use of the PuraPly currently. Subsequently we did review her culture results and to be honest I think that the Bactrim is probably the best option to have her continue at this point. For that reason I am get a go ahead and send in a refill today for her for an additional 2 weeks. Nonetheless I think that we are making excellent progress here. It was Enterococcus and Staphylococcus that were noted she is allergic to penicillin so there is not much I can do from the Enterococcus standpoint though the staff does seem to be sensitive to the Bactrim. 03/06/2021 upon evaluation today patient appears to be doing well with regard to her wound. This is definitely showing signs of improvement which is great news and overall I am extremely pleased with where things stand today. There does not appear to be any signs of active infection which is great news and overall and I do believe that we are headed in the appropriate direction I think the PuraPly is doing an awesome job for her. 03/20/2021 upon evaluation today patient actually appears to be doing well with regard to her wound. We have been using the PuraPly although last week when I was out of the office they actually just used endoform nonetheless this still seems to be doing great. Fortunately there does  not appear to be any signs of active infection which is great news overall I think the patient is making wonderful progress. 03/27/2021 upon evaluation today patient appears to be doing well with regard to her wound in fact there is a little drainage but otherwise her does not appear to be any major signs of open wounds nor infection at this time. Overall I am extremely pleased with where things  stand. No fevers, chills, nausea, vomiting, or diarrhea. 04/03/2021 upon evaluation today patient presents for reevaluation here in the clinic and overall she seems to be doing quite well. Fortunately there does not appear to be any signs of active infection which is great news and in general I am very pleased with how things appear. There is still a very small opening remaining but in general she is looking much better. Fortunately I do not think there is any need for the PuraPly at this point. 04/10/2021 upon evaluation today patient's wound is actually showing signs of excellent improvement. I am actually very pleased with where it stands today. I think she is very close to complete closure in fact this may be healed today. Either way I think that she may benefit from using her own compression socks for the next week and then will subsequently see how things stand following. 04/17/2021 upon evaluation today patient appears to be doing well with regard to her wound on the ankle region. This is actually showing signs of excellent improvement which is great news. I do not see any signs of active infection at this time which is great and no open wound. With that being said on the lateral portion of her ankle right at the base of her plate she actually has an area of pressure that occurred over the past week when she was not in the wrap or we been padding her. This does seem to be an issue which I think is good to be an ongoing problem I am can actually get in touch with Dr. [name] today to see what we can do in that regard. 04/24/2021 upon evaluation today patient appears to be doing well with regard to her ankle region for the most part although there does appear to be some fluid trapped underneath the area where we thought this was healed last week. Unfortunately I think at this time that we can have to reopen and clean away some of this area and then subsequently depending on how things do over the  next week or so we will see about discharge but right now I do not think that is the right way to go. She voiced understanding. 05/01/2021 upon evaluation today patient's wound still appears to be open unfortunately. I do believe that we do need to see what we can do about trying to get this closed I believe compression wrapping is probably can to be indicated here. Fortunately there is no signs of active infection at this time. 05/08/2021 upon evaluation today patient appears to be doing okay in regard to her ulcer this is very superficial and we will see any major issues here. The biggest problem currently that I find is that we just cannot get this area to completely seal up. She seemed to almost be and then things digressed a little bit. Right now is not too bad but we do need to see if we can make some difference here. I think maybe switching over to collagen may be better. 05/15/2021 upon evaluation today patient actually appears to be  doing pretty well in regard to her wound. This is measuring a little smaller and looking much better. She did see orthopedics today there to me making her a insert for her shoe to try to help straighten out her gait. I think this can be beneficial for her as well. 05/22/2021 upon evaluation today patient appears to be doing well currently in regard to her wound although is not significantly smaller unfortunately this at least does not show any signs of active infection at this time which is great news. No fevers, chills, nausea, vomiting, or diarrhea. I am going to see about switching over to endoform that is what we wanted to use in the past always been on backorder. 06/05/2021 upon evaluation today patient appears to be doing somewhat poorly in regard to her wound. This is really not showing the signs of improvement that I would have like to have seen. Fortunately I do not see any signs of active infection locally nor systemically which is great news. With that  being said even though I do not see the sign she is having some increased discomfort which is not good. She is also having some issues here with the wound looking a little larger today compared to where its been. With that being said overall I believe that we probably need to consider at least that there may be a subacute infection here and that is what is going on. We have tried multiple different medications and dressings which have done well for her in the past without seeing any signs of improvement. She is in agreement with addressing this as such. 06/12/2020 upon evaluation today patient's wound is actually showing some signs of improvement today. In fact I do not even see any need for sharp debridement based on what I see at this point today. Fortunately there is no evidence of active infection currently which is great news. No fevers, chills, nausea, vomiting, or diarrhea. 06/26/2021 upon evaluation today patient appears to be doing well with regard to her wounds. Fortunately there does not appear to be any signs of active infection at this time which is great news. I do believe the Bactrim is doing well for her. Fortunately there does not appear to be any signs of active infection at this time. With the hardware in place and everything else I do believe extending this for 2 additional weeks for 4 weeks total would be advisable. Electronic Signature(s) Signed: 06/26/2021 11:09:46 AM By: [name] PA-C Entered By: [name] on 06/26/2021 11:09:46 -------------------------------------------------------------------------------- Physical Exam Details Patient Name: Date of Service: [facility] [name], [name] 06/26/2021 10:15 A M Medical Record Number: [id number] Patient Account Number: [id number] Date of Birth/Sex: Treating RN: May 06, 1956 (66 y.o. [name] Provider: [name] Other Clinician: Referring Provider: Treating Provider/Extender: [name] in Treatment: 33 Constitutional Well-nourished and well-hydrated in no acute distress. Respiratory normal breathing without difficulty. Psychiatric this patient is able to make decisions and demonstrates good insight into disease process. Alert and Oriented x 3. pleasant and cooperative. Notes Upon inspection patient's wound bed actually showed signs of good granulation and epithelization at this point. Fortunately I do not see any signs of active infection at this time which is great news and overall I am extremely pleased with where we stand today. I am getting go ahead and see about checking into PuraPly for her in the past this did very well. Electronic Signature(s) Signed: 06/26/2021 11:10:09 AM  By: [name] PA-C Entered By: [name] on 06/26/2021 11:10:09 -------------------------------------------------------------------------------- Physician Orders Details Patient Name: Date of Service: [facility] [name], [name] 06/26/2021 10:15 A M Medical Record Number: [id number] Patient Account Number: [id number] Date of Birth/Sex: Treating RN: 01/15/1956 (66 y.o. 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 Margarita Grizzle Return A Cellular or Product/process development scientist or Tissue Based Product Type: - run IVR for [employer] May shower with protection but do not get wound dressing(s) wet. Edema Control - Lymphedema / SCD /  Other Bilateral Lower Extremities Elevate legs to the level of the heart or above for 30 minutes daily and/or when sitting, a frequency of: - throughout the day when sitting Avoid standing for long periods of time. Patient to wear own compression stockings every day. Exercise regularly Moisturize legs daily. Non Wound Condition pply the following to affected area as directed: - Apply silicone bordered foam to left lateral foot for protection A Wound Treatment Wound #15R - Malleolus Wound Laterality: Left, Medial Cleanser: Wound Cleanser (Generic) 1 x Per Day/15 Days Discharge Instructions: Cleanse the wound with wound cleanser prior to applying a clean dressing using gauze sponges, not tissue or cotton balls. Peri-Wound Care: Sween Lotion (Moisturizing lotion) 1 x Per Day/15 Days Discharge Instructions: Apply moisturizing lotion as directed Prim Dressing: Hydrofera Blue Ready Foam, 2.5 x2.5 in 1 x Per Day/15 Days ary Discharge Instructions: Apply to wound bed as instructed Secondary Dressing: Woven Gauze Sponge, Non-Sterile 4x4 in (Generic) 1 x Per Day/15 Days Discharge Instructions: Apply over primary dressing as directed. Compression Wrap: CoFlex TLC XL 2-layer Compression System 4x7 (in/yd) 1 x Per Day/15 Days Discharge Instructions: Apply CoFlex 2-layer compression as directed. (alt for 4 layer) Patient Medications llergies: penicillin, doxycycline A Notifications Medication Indication Start End 06/26/2021 Bactrim DS DOSE 1 - oral 800 mg-160 mg tablet - 1 tablet oral taken 2 times per day for 14 days Electronic Signature(s) Signed: 06/26/2021 11:11:28 AM By: [name] PA-C Entered By: [name] on 06/26/2021 11:11:26 -------------------------------------------------------------------------------- Problem List Details Patient Name: Date of Service: [employer] [facility] [name], [name] 06/26/2021 10:15 A M Medical Record Number: [id number] Patient Account Number: [id number] Date of  Birth/Sex: Treating RN: [birth year] (66 y.o. 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 01/17/2020 No Yes Inactive Problems ICD-10 Code Description Active Date Inactive Date L03.116 Cellulitis of left lower limb 01/31/2020 01/31/2020 T81.31XD Disruption of external operation (surgical) wound, not elsewhere classified, subsequent 01/17/2020 01/17/2020 encounter Resolved Problems Electronic Signature(s) Signed: 06/26/2021 10:26:47 AM By: [name] PA-C Previous Signature: 06/26/2021 10:24:50 AM Version By: [name] PA-C Entered By:  [name] on 06/26/2021 10:26:47 -------------------------------------------------------------------------------- Progress Note Details Patient Name: Date of Service: [facility] [name], [name] 06/26/2021 10:15 A M Medical Record Number: [id number] Patient Account Number: [id number] Date of Birth/Sex: Treating RN: 1956-05-23 (66 y.o. [name] Provider: [name] Other Clinician: Referring Provider: Treating Provider/Extender: [name] in Treatment: 49 Subjective Chief Complaint Information obtained from Patient patient is been followed long-term in this clinic for venous insufficiency ulcers with inflammation, hypertension and ulceration over the medial ankle bilaterally. 01/17/2020; this is a patient who is here for review of postoperative wounds on the left lateral ankle and  recurrence of venous stasis ulceration on the left medial History of Present Illness (HPI) the remaining wound is over the left medial ankle. Similar wound over the right medial ankle healed largely with use of Apligraf. Most recently we have been using Hydrofera Blue over this wound with considerable improvement. The patient has been extensively worked up in the past for her venous insufficiency and she is not a candidate for antireflux surgery although I have none of the details available currently. 08/24/14; considerable improvement today. About 50% of this wound areas now epithelialized. The base of the wound appears to be healthier granulation.as opposed to last week when she had deteriorated a considerable improvement 08/17/14; unfortunately the wound has regressed somewhat. The areas of epithelialization from the superior aspect are not nearly as healthy as they were last week. The patient thinks her Hydrofera Blue slipped. 09/07/14; unfortunately the area has markedly regressed in the 2 weeks since I've seen this. There is an odor surrounding erythema. The healthy granulation tissue that we had at the base of the wound now is a dusky color. The nurse reports green drainage 09/14/14; the area looks somewhat better than last week. There is less erythema and less drainage. The culture I did did not show any growth. Nevertheless I think it is better to continue the Cipro and doxycycline for a further week. The remaining wound area was debridement. 09/21/14. Wound did not require debridement last week. Still less erythema and less drainage. She can complete her antibiotics. The areas of epithelialization in the superior aspect of the wound do not look as healthy as they did some weeks ago 10/05/14 continued improvement in the condition of this wound. There is advancing epithelialization. Less aggressive debridement required 10/19/14 continued improvement in the condition and volume of this wound. Less  aggressive debridement to the inferior part of this to remove surface slough and fibrinous eschar 11/02/14 no debridement is required. The surface granulation appears healthy although some of her islands of epithelialization seem to have regressed. No evidence of infection 11/16/14; lites surface debridement done of surface eschar. The wound does not look to be unhealthy. No evidence of infection. Unfortunately the patient has had podiatry issues in the right foot and for some reason has redeveloped small surface ulcerations in the medial right ankle. Her original presentation involved wounds in this area 11/23/14 no debridement. The area on the right ankle has enlarged. The left ankle wound appears stable in terms of the surface although there is periwound inflammation. There has been regression in the amount of new skin 11/30/14 no debridement. Both wound areas appear healthy. There was no evidence of infection. The the new area on the right medial ankle has enlarged although that both the surfaces appear to be stable. 12/07/14; Debridement of the right medial ankle wound. No no debridement was done on  the left. 12/14/14 no major change in and now bilateral medial ankle wounds. Both of these are very painful but the no overt evidence of infection. She has had previous venous ablation 12/21/14; patient states that her right medial ankle wound is considerably more painful last week than usual. Her left is also somewhat painful. She could not tolerate debridement. The right medial ankle wound has fibrinous surface eschar 12/28/14 this is a patient with severe bilateral venous insufficiency ulcers. For a considerable period of time we actually had the one on the right medial ankle healed however this recently opened up again in June. The left medial ankle wound has been a refractory area with some absent flows. We had some success with Hydrofera Blue on this area and it literally closed by 50% however it is  recently opened up Foley. Both of these were debridement today of surface eschar. She tolerates this poorly 01/25/15: No change in the status of this. Thick adherent escar. Very poor tolerance of any attempt at debridement. I had healed the right medial malleolus wound for a considerable amount of time and had the left one down to about 50% of the volume although this is totally regressed over the last 48 weeks. Further the right leg has reopened. she is trying to make a appointment with pain and vascular, previous ablations with Dr. Aleda Grana. I do not believe there is an arterial insufficiency issue here 02/01/15 the status of the adherent eschar bilaterally is actually improved. No debridement was done. She did not manage to get vascular studies done 02/08/15 continued debridement of the area was done today. The slough is less adherent and comes off with less pressure. There is no surrounding infection peripheral pulses are intact 02/15/15 selective debridement with a disposable curette. Again the slough is less adherent and comes off with less difficulty. No surrounding infection peripheral pulses are intact. 02/22/15 selective debridement of the right medial ankle wound. Slough comes off with less difficulty. No obvious surrounding infection peripheral pulses are intact I did not debridement the one on the left. Both of these are stable to improved 03/01/15 selective debridement of both wound areas using a curette to. Adherent slough cup soft with less difficulty. No obvious surrounding infection. The patient tells me that 2 days ago she noted a rash above the right leg wrap. She did not have this on her lower legs when she change this over she arrives with widespread left greater than right almost folliculitis-looking rash which is extremely pruritic. I don't see anything to culture here. There is no rash on the rest of her body. She feels well systemically. 03/08/15; selective debridement of both  wounds using a curette. Base of this does not look unhealthy. She had limegreen drainage coming out of the left leg wound and describes a lot of drainage. The rash on her left leg looks improved to. No cultures were done. 03/22/15; patient was not here last week. Basal wounds does not look healthy and there is no surrounding erythema. No drainage. There is still a rash on the left leg that almost looks vasculitic however it is clearly limited to the top of where the wrap would be. 04/05/15; on the right required a surgical debridement of surface eschar and necrotic subcutaneous tissue. I did not debridement the area on the left. These continue to be large open wounds that are not changing that much. We were successful at one point in healing the area on the right, and at the same  time the area on the left was roughly half the size of current measurements. I think a lot of the deterioration has to do with the prolonged time the patient is on her feet at work 04/19/15 I attempted-like surface debridement bilaterally she does not tolerate this. She tells me that she was in allergic care yesterday with extreme pain over her left lateral malleolus/ankle and was told that she has an "sprain" 05/03/15; large bilateral venous insufficiency wounds over the medial malleolus/medial aspect of her ankles. She complains of copious amounts of drainage and his usual large amounts of pain. There is some increasing erythema around the wound on the right extending into the medial aspect of her foot to. historically she came in with these wounds the right one healed and the left one came down to roughly half its current size however the right one is reopened and the left is expanded. 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 05/10/15 large bilateral venous insufficiency wounds. There is less adherence surface left however the surface culture that I did last week grew  pseudomonas therefore bilateral selective debridement score necessary. There is surrounding erythema. The patient describes severe bilateral drainage and a lot of pain in the left ankle. Apparently her podiatrist was were ready to do a cortisone shot 05/17/15; the patient complains of pain and again copious amounts of drainage. 05/24/15; we used Iodo flex last week. Patient notes considerable improvement in wound drainage. Only needed to change this once. 05/31/15; we continued Iodoflex; the base of these large wounds bilaterally is not too bad but there is probably likely a significant bioburden here. I would like to debridement just doesn't tolerate it. 06/06/14 I would like to continue the Iodoflex although she still hasn't managed to obtain supplies. She has bilateral medial malleoli or large wounds which are mostly superficial. Both of them are covered circumferentially with some nonviable fibrinous slough although she tolerates debridement very poorly. She apparently has an appointment for an ablation on the right leg by interventional radiology. 06/14/15; the patient arrives with the wounds and static condition. We attempted a debridement although she does not do well with this secondary to pain. I 07/05/15; wounds are not much smaller however there appears to be a cleaner granulating base. The left has tight fibrinous slough greater than the right. Debridement is tolerated poorly due to pain. Iodoflex is done more for these wounds in any of the multitude of different dressings I have tried on the left 1 and then subsequently the right. 07/12/15; no change in the condition of this wound. I am able to do an aggressive debridement on the right but not the left. She simply cannot tolerate it. We have been using Iodoflex which helps somewhat. It is worthwhile remembering that at one point we healed the right medial ankle wound and the left was about 25% of the current circumference. We have suggested  returning to vascular surgery for review of possible further ablations for one reason or another she has not been able to do this. 07/26/15 no major change in the condition of either wound on her medial ankle. I did not attempt to debridement of these. She has been aggressively scrubbing these while she is in the shower at home. She has her supply of Iodoflex which seems to have done more for these wounds then anything I have put on recently. 08/09/15 wound area appears larger although not  verified by measurements. Using Iodoflex 09/05/2015 -- she was here for avisit today but had significant problems with the wound and I was asked to see her for a physician opinion. I have summarize that this lady has had surgery on her left lower extremity about 10 years ago where the possible veins stripping was done. She has had an opinion from interventional radiology around November 2016 where no further sclerotherapy was ordered. The patient works 12 hours a day and stands on a concrete floor with work boots and is unable to get the proper compression she requires and cannot elevate her limbs appropriately at any given time. She has recently grown Pseudomonas from her wound culture but has not started her ciprofloxacin which was called in for her. 09/13/15 this continues to be a difficult situation for this patient. At one point I had this wound down to a 1.5 x 1.5" wound on her left leg. This is deteriorated and the right leg has reopened. She now has substantial wounds on her medial calcaneus, malleoli and into her lower leg. One on the left has surface eschar but these are far too painful for me to debridement here. She has a vascular surgery appointment next week to see if anything can be done to help here. I think she has had previous ablations several years ago at [facility]. She has no major edema. She tells me that she did not get product last time [facility]] and went for several days without it. She  continues to work in work boots 12 hours a day. She cannot get compression/4-layer under her work boots. 09/20/15 no major change. Periwound edema control was not very good. Her point with pain and vascular is next Wednesday the 25th 09/28/15; the patient is seen vascular surgery and is apparently scheduled for repeat duplex ultrasounds of her bilateral lower legs next week. 10/05/15; the patient was seen by Dr. Doren Custard of vascular surgery. He feels that she should have arterial insufficiency excluded as cause/contributed to her nonhealing stage she is therefore booked for an arteriogram. She has apparently monophasic signals in the dorsalis pedis pulses. She also of course has known severe chronic venous insufficiency with previous procedures as noted previously. I had another long discussion with the patient today about her continuing to work 12 hour shifts. I've written her out for 2 months area had concerns about this as her work location is currently undergoing significant turmoil and this may lead to her termination. She is aware of this however I agree with her that she simply cannot continue to stand for 12 hours multiple days a week with the substantial wound areas she has. 10/19/15; the Dr. Doren Custard appointment was largely for an arteriogram which was normal. She does not have an arterial issue. He didn't make a comment about her chronic venous insufficiency for which she has had previous ablations. Presumably it was not felt that anything additional could be done. The patient is now out of work as I prescribed 2 weeks ago. Her wounds look somewhat less aggravated presumably because of this. I felt I would give debridement another try today 10/25/15; no major change in this patient's wounds. We are struggling to get her product that she can afford into her own home through her insurance. 11/01/15; no major change in the patient's wounds. I have been using silver alginate as the most affordable product. I  spoke to Dr. Marla Roe last week with her requested take her to the OR for surgical debridement  and placement of ACEL. Dr. Marla Roe told me that she would be willing to do this however [facility] will not cover this, fortunately the patient has [place] healthcare of some variant 11/08/15; no major change in the patient's wounds. She has been completely nonviable surface that this but is in too much pain with any attempted debridement are clinic. I have arranged for her to see Dr. Marla Roe ham of plastic surgery and this appointment is on Monday. I am hopeful that they will take her to the OR for debridement, possible ACEL ultimately possible skin graft 11/22/15 no major change in the patient's wounds over her bilateral medial calcaneus medial malleolus into the lower legs. Surface on these does not look too bad however on the left there is surrounding erythema and tenderness. This may be cellulitis or could him sleepy tinea. 11/29/15; no major changes in the patient's wounds over her bilateral medial malleolus. There is no infection here and I don't think any additional antibiotics are necessary. There is now plan to move forward. She sees Dr. Marla Roe in a week's time for preparation for operative debridement and ACEL placement I believe on 7/12. She then has a follow-up appointment with Dr. Marla Roe on 7/21 12/28/15; the patient returns today having been taken to the [place] by Dr. Marla Roe 12/12/15 she underwent debridement, intraoperative cultures [which were negative]. She had placement of a wound VAC. Parent really ACEL was not available to be placed. The wound VAC foam apparently adhered to the wound since then she's been using silver alginate, Xeroform under Ace wraps. She still says there is a lot of drainage and a lot of pain 01/31/16; this is a patient I see monthly. I had referred her to Dr. Marla Roe him of plastic surgery for large wounds on her bilateral medial ankles. She has  been to the OR twice once in early July and once in early August. She tells me over the last 3 weeks she has been using the wound VAC with ACEL underneath it. On the right we've simply been using silver alginate. Under Kerlix Coban wraps. 02/28/16; this is a patient I'm currently seeing monthly. She is gone on to have a skin graft over her large venous insufficiency ulcer on the left medial ankle. This was done by Dr. Marla Roe him. The patient is a bit perturbed about why she didn't have one on her right medial ankle wound. She has been using silver alginate to this. 03/06/16; I received a phone call from her plastic surgery Dr. Marla Roe. She expressed some concern about the viability of the skin graft she did on the left medial ankle wound. Asked me to place Endoform on this. She told me she is not planning to do a subsequent skin graft on the right as the left one did not take very well. I had placed Hydrofera Blue on the right 03/13/16; continue to have a reasonably healthy wound on the right medial ankle. Down to 3 mm in terms of size. There is epithelialization here. The area on the left medial ankle is her skin graft site. I suppose the last week this looks somewhat better. She has an open area inferiorly however in the center there appears to be some viable tissue. There is a lot of surface callus and eschar that will eventually need to come off however none of this looked to be infected. Patient states that the is able to keep the dressing on for several days which is an improvement. 03/20/16 no  major change in the circumference of either wound however on the left side the patient was at Dr. Eusebio Friendly office and they did a debridement of left wound. 50% of the wound seems to be epithelialized. I been using Endoform on the left Hydrofera Blue in the right 03/27/16; she arrives today with her wound is not looking as healthy as they did last week. The area on the right clearly has an  adherent surface to this a very similar surface on the left. Unfortunately for this patient this is all too familiar problem. Clearly the Endoform is not working and will need to change that today that has some potential to help this surface. She does not tolerate debridement in this clinic very well. She is changing the dressing wants 04/03/16; patient arrives with the wounds looking somewhat better especially on the right. Dr. Migdalia Dk change the dressing to silver alginate when she saw her on Monday and also sold her some compression socks. The usefulness of the latter is really not clear and woman with severely draining wounds. 04/10/16; the patient is doing a bit of an experiment wearing the compression stockings that Dr. Migdalia Dk provided her to her left leg and the out of legs based dressings that we provided to the right. 05/01/16; the patient is continuing to wear compression stockings Dr. Migdalia Dk provided her on the left that are apparently silver impregnated. She has been using Iodoflex to the right leg wound. Still a moderate amount of drainage, when she leaves here the wraps only last for 4 days. She has to change the stocking on the left leg every night 05/15/16; she is now using compression stockings bilaterally provided by Dr. Marla Roe. She is wearing a nonadherent layer over the wounds so really I don't think there is anything specific being done to this now. She has some reduction on the left wound. The right is stable. I think all healing here is being done without a specific dressing 06/09/16; patient arrives here today with not much change in the wound certainly in diameter to large circular wounds over the medial aspect of her ankle bilaterally. Under the light of these services are certainly not viable for healing. There is no evidence of surrounding infection. She is wearing compression stockings with some sort of silver impregnation as prescribed by Dr. Marla Roe. She has a  follow-up with her tomorrow. 06/30/16; no major change in the size or condition of her wounds. These are still probably covered with a nonviable surface. She is using only her purchase stockings. She did see Dr. Marla Roe who seemed to want to apply Dakin's solution to this I'm not extreme short what value this would be. I would suggest Iodoflex which she still has at home. 07/28/16; I follow Mrs. Geddis episodically along with Dr. Marla Roe. 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 She has been applying a topical collagen ointment to both wounds with Adaptic. I don't think Dr. Marla Roe is planning to take her back to the OR. 08/19/16; I follow Mrs. Jeneen Rinks on a monthly basis along with Dr. Marla Roe of plastic surgery. 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 I been following her for a number of years. At one point I was able to get the right medial malleolus wound to heal and had the left medial malleolus down to about half its current size however and I had to send her to plastic  surgery for an operative debridement. Since then things have been stable to slightly improve the area on the right is slightly better one in the left about the same although there is much less adherent surface than I'm used to with this patient. She is using some form of liquid collagen gel that Dr. Marla Roe provided a Kerlix cover with the patient's own pressure stockings. She tells me that she has extreme pain in both ankles and along the lateral aspect of both feet. She has been unable to work for some period of time. She is telling me she is retiring at the beginning of April. She sees Dr. Doran Durand of orthopedics next week 09/22/16; patient has not seen Dr. Marla Roe since the last time she is here. I'm not really sure what she is using to the wounds other than bits and pieces of think  she had left over including most recently Hydrofera Blue. She is using juxtalite stockings. She is having difficulty with her husband's recent illness "stroke". She is having to transport him to various doctors appointments. Dr. Marla Roe left her the option of a repeat debridement with ACEL however she has not been able to get the time to follow-up on this. She continues to have a fair amount of drainage out of these wounds with certainly precludes leaving dressings on all week 10/13/16; patient has not seen Dr. Marla Roe since she was last in our clinic. I'm not really sure what she is doing with the wounds, we did try to get her [facility] and I think she is actually using this most of the time. Because of drainage she states she has to change this every second day although this is an improvement from what she used to do. She went to see Dr. Doran Durand who did not think she had a muscular issue with regards to her feet, he referred her to a neurologist and I think the appointment is sometime in June. I changed her back to Iodoflex which she has used in the past but not recently. 11/03/16; the patient has been using Iodoflex although she ran out of this. Still claims that there is a lot of drainage although the wound does not look like this. No surrounding erythema. She has not been back to see Dr. Marla Roe 11/24/16; the patient has been using Iodoflex again but she ran out of it 2 or 3 days ago. There is no major change in the condition of either one of these wounds in fact they are larger and covered in a thick adherent surface slough/nonviable tissue especially on the left. She does not tolerate mechanical debridement in our clinic. Going back to see Dr. Marla Roe of plastic surgery for an operative debridement would seem reasonable. 12/15/16; the patient has not been back to see Dr. Marla Roe. She is been dealing with a series of illnesses and her husband which of monopolized her time. She is  been using Sorbact which we largely supplied. She states the drainage is bad enough that it maximum she can go 2-3 days without changing the dressing 01/12/2017 -- the patient has not been back for about 4 weeks and has not seen Dr. Marla Roe not does she have any appointment pending. 01/23/17; patient has not seen Dr. Marla Roe even though I suggested this previously. She is using Santyl that was suggested last week by Dr. Con Memos this Cost her $16 through her insurance which is indeed surprising 02/12/17; continuing Santyl and the patient is changing this daily. A lot of drainage. She  has not been back to see plastic surgery she is using an Ace wrap. Our intake nurse suggested wrap around stockings which would make a good reasonable alternative 02/26/17; patient is been using Santyl and changing this daily due to drainage. She has not been to see plastic surgery she uses in April Ace wrap to control the edema. She did obtain extremitease stockings but stated that the edema in her leg was to big for these 03/20/17; patient is using Santyl and Anasept. Surfaces looked better today the area on the right is actually measuring a little smaller. She has states she has a lot of pain in her feet and ankles and is asking for a consult to pain control which I'll try to help her with through our case manager. 04/10/17; the patient arrives with better-looking wound surfaces and is slightly smaller wound on the left she is using a combination of Santyl and Anasept. She has an appointment or at least as started in the pain control center associated with [facility] 05/14/17; this is a patient who I followed for a prolonged period of time. She has venous insufficiency ulcers on her bilateral medial ankles. At one point I had this down to a much smaller wound on the left however these reopened and we've never been able to get these to heal. She has been using Santyl and Anasept gel although 2 weeks ago she ran  out of the Anasept gel. She has a stable appearance of the wound. She is going to the wound care clinic at [facility]. They wanted do a nerve block/spinal block although she tells me she is reluctant to go forward with that. 05/21/17; this is a patient I have followed for many years. She has venous insufficiency ulcers on her bilateral medial ankles. Chronic pain and deformity in her ankles as well. She is been to see plastic surgery as well as orthopedics. Using PolyMem AG most recently/Kerramax/ABDs and 2 layer compression. She has managed to keep this on and she is coming in for a nurse check to change the dressing on Tuesdays, we see her on Fridays 06/05/17; really quite a good looking surface and the area especially on the right medial has contracted in terms of dimensions. Well granulated healthy-looking tissue on both sides. Even with an open curet there is nothing that even feels abnormal here. This is as good as I've seen this in quite some time. We have been using PolyMem AG and bringing her in for a nurse check 06/12/17; really quite good surface on both of these wounds. The right medial has contracted a bit left is not. We've been using PolyMem and AG and she is coming in for a nurse visit 06/19/17; we have been using PolyMem AG and bringing her in for a nurse check. Dimensions of her wounds are not better but the surfaces looked better bilaterally. She complained of bleeding last night and the left wound and increasing pain bilaterally. She states her wound pain is more neuropathic than just the wounds. There was some suggestion that this was radicular from her pain management doctor in talking to her it is really difficult to sort this out. 06/26/17; using PolyMem and AG and bringing her in for a nurse check as All of this and reasonably stable condition. Certainly not improved. The dimensions on the lateral part of the right leg look better but not really measuring better. The medial  aspect on the left is about the same. 07/03/16; we have been  using PolyMen AG and bringing her in for a nurse check to change the dressings as the wounds have drainage which precludes once weekly changing. We are using all secondary absorptive dressings.our intake nurse is brought up the idea of using a wound VAC/snap VAC on the wound to help with the drainage to see if this would result in some contraction. This is not a bad idea. The area on the right medial is actually looking smaller. Both wounds have a reasonable-looking surface. There is no evidence of cellulitis. The edema is well controlled 07/10/17; the patient was denied for a snap VAC by her insurance. The major issue with these wounds continues to be drainage. We are using wicked PolyMem AG and she is coming in for a nurse visit to change this. The wounds are stable to slightly improved. The surface looks vibrant and the area on the right certainly has shrunk in size but very slowly 07/17/17; the patient still has large wounds on her bilateral medial malleoli. Surface of both of these wounds looks better. The dimensions seem to come and go but no consistent improvement. There is no epithelialization. We do not have options for advanced treatment products due to insurance issues. They did not approve of the wound VAC to help control the drainage. More recently we've been using PolyMem and AG wicked to allow drainage through. We have been bringing her in for a nurse visit to change this. We do not have a lot of options for wound care products and the home again due to insurance issues 07/24/17; the patient's wound actually looks somewhat better today. No drainage measurements are smaller still healthy-looking surface. We used silver collagen under PolyMen started last week. We have been bringing her in for a dressing change 07/31/17; patient's wound surface continued to look better and I think there is visible change in the dimensions of the wound on  the right. Rims of epithelialization. We have been using silver collagen under PolyMen and bringing her in for a dressing change. There appears to be less drainage although she is still in need of the dressing change 08/07/17. Patient's wound surface continues to look better on both sides and the area on the right is definitely smaller. We have been using silver collagen and PolyMen. She feels that the drainage has been it has been better. I asked her about her vascular status. She went to see Dr. Aleda Grana at [facility] and had some form of ablation. I don't have much detail on this. I haven't my notes from 2016 that she was not a candidate for any further ablation but I don't have any more information on this. We had referred her to vein and vascular I don't think she ever went. He does not have a history of PAD although I don't have any information on this either. We don't even have ABIs in our record 08/14/17; we've been using silver collagen and PolyMen cover. And putting the patient and compression. She we are bringing her in as a nurse visit to change this because ofarge amount of drainage. We didn't the ABIs in clinic today since they had been done in many moons 1.2 bilaterally. She has been to see vein and vascular however this was at [facility] and she had ablation although I really don't have any information on this all seemed biking get a report. She is also been operatively debrided by plastic surgery and had a cell placed probably 8-12 months ago. This didn't have a  major effect. We've been making some gains with current dressings 08/19/17-She is here in follow-up evaluation for bilateral medial malleoli ulcers. She continues to tolerate debridement very poorly. We will continue with recently changed topical treatment; if no significant improvement may consider switching to Iodosorb/Iodoflex. 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  She has been using silver collagen and PolyMem. I believe she has been used and tried on Iodoflex before. 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 These have been very refractory over time. We have been using silver collagen and PolyMen. She is coming in once a week for a doctor's and once a week for nurse visits. We are actually making some progress 09/18/17; the patient's wounds are smaller especially on the right medial. She arrives today to upset to consider even washing these off with Anasept which I think is been part of the reason this is been closing. We've been using collagen covered in PolyMen otherwise. It is noted that she has a small area of folliculitis on the right medial calf that. As we are wrapping her legs I'll give her a short course of doxycycline to make sure this doesn't amount to anything. She is a long list of complaints today including imbalance, shortness of breath on exertion, inversion of her left ankle. With regards to the latter complaints she is been to see orthopedics and they offered her a tendon release surgery I believe but wanted her wounds to be closed first. I have recommended she go see her primary physician with regards to everything else. 09/25/17; patient's wounds are about the same size. We have made some progress bilaterally although not in recent weeks. She will not allow me T wash these wounds with Anasept even if she is doing her cell. Wheeze we've been using collagen covered in PolyMen. Last week she had a small area of folliculitis this is now opened into a small wound. She completed 5 days of trimethoprim sulfamethoxazole 10/02/17; unfortunately the area on her left medial ankle is worse with a larger wound area towards the Achilles. The patient complains of a lot  of pain. She will not allow debridement although visually I don't think there is anything to debridement in any case. We have been using silver collagen and PolyMen for several months now. Initially we are making some progress although I'm not really seeing that today. We will move back to [facility]. His admittedly this is a bit of a repeat however I'm hoping that his situation is different now. The patient tells me she had her leg on the left give out on her yesterday this is process some pain. 10/09/17; the patient is seen twice a week largely because of drainage issues coming out of the chronic medial bimalleolar wounds that are chronic. Last week the dimensions of the one on the left looks a little larger I changed her to [facility]. She comes in today with a history of terrible pain in the bilateral wound areas. She will not allow debridement. She will not even allow a tissue culture. There is no surrounding erythema no no evidence of cellulitis. We have been putting her Kerlix Coban man. She will not allow more aggressive compression as there was a suggestion to put her in 3 layer wraps. 10/16/17; large  wounds on her bilateral medial malleoli. These are chronic. Not much change from last week. The surface looks have healthy but absolutely no epithelialization. A lot of pain little less so of drainage. She will not allow debridement or even washing these off in the vigorous fashion with Anasept. 10/23/17; large wounds on her bilateral malleoli which are chronic. Some improvement in terms of size perhaps on the right since last time I saw these. She states that after we increased the 3 layer compression there was some bleeding, when she came in for a nurse visit she did not want 3 layer compression put back on about our nurse managed to convince her. She has known chronic venous visit issues and I'm hoping to get her to tolerate the 3 layer compression. using Hydrofera Blue 10/30/17;  absolutely no change in the condition of either wound although we've had some improvement in dimensions on the right.. Attempted to put her in 3 layer compression she didn't tolerated she is back in 2 layer compression. We've been using Hydrofera Blue We looked over her past records. She had venous reflux studies in November 2016. There was no evidence of deep venous reflux on the right. Superficial vein did not show the greater saphenous vein at think this is been previously ablated the small saphenous vein was within normal limits. The left deep venous system showed no DVT the vessels were positive for deep venous reflux in the posterior tibial veins at the ankle. The greater saphenous vein was surgically absent small saphenous vein was within normal limits. She went to vein and vascular at [place] [facility]. I believe she had an ablation on the left greater saphenous vein. I'll update her reflux studies perhaps ever reviewed by vein and vascular. We've made absolutely no progress in these wounds. Will also try to read and TheraSkins through her insurance 11/06/17; W the patient apparently has a 2 week follow-up with vein and vascular I like him to review the whole issue with regards to her previous vascular workup by Dr. Aleda Grana. We've really made no progress on these wounds in many months. She arrives today with less viable looking surface on the left medial ankle wound. This was apparently looking about the same on Tuesday when she was here for nurse visit. 11/13/17; deep tissue culture I did last time of the left lower leg showed multiple organisms without any predominating. In particular no Staphylococcus or group A strep were isolated. We sent her for venous reflux studies. She's had a previous left greater saphenous vein stripping and I think sclerotherapy of the right greater saphenous vein. She didn't really look at the lesser saphenous vein this both wounds are on the medial aspect. She has  reflux in the common femoral vein and popliteal vein and an accessory vein on the right and the common femoral vein and popliteal vein on the left. I'm going to have her go to see vein and vascular just the look over things and see if anything else beside aggressive compression is indicated here. We have not been able to make any progress on these wounds in spite of the fact that the surface of the wounds is never look too bad. 11/20/17; no major change in the condition of the wounds. Patient reports a large amount of drainage. She has a lot of complaints of pain although enlisting her today I wonder if some of this at least his neuropathic rather than secondary to her wounds. She has an appointment with vein and  vascular on 12/30/17. The refractory nature of these wounds in my mind at least need vein and vascular to look over the wounds the recent reflux studies we did and her history to see if anything further can be done here. I also note her gait is deteriorated quite a bit. 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 She has a bilateral Trendelenburg gait. I wonder if this is neuropathic or perhaps multilevel radicular. 11/27/17; her wounds actually looks slightly better. Healthy-looking granulation tissue a scant amount of epithelialization. [place] [employer] will not pay for [employer]. They will play for tri layer Oasis and Dermagraft. This is not a diabetic ulcer. We'll try for the tri layer Oasis. She still complains of some drainage. She has a vein and vascular appointment on 12/30/17 12/04/17; the wounds visually look quite good. Healthy-looking granulation with some degree of epithelialization. We are still waiting for response to our request for trial to try layer Oasis. Her appointment with vascular to review venous and arterial issues isn't sold the end of July 7/31. Not allow debridement or even vigorous cleansing of the wound surface. 12/18/17; slightly smaller especially on the  right. 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 We've been using Hydrofera Blue. We still are looking into triple layer Oasis through her insurance 01/08/18 on evaluation today patient's wound actually appears to be showing signs of good improvement at this point in time. She has been tolerating the dressing changes without complication. Fortunately there does not appear to be any evidence of infection at this point in time. We have been utilizing silver nitrate which does seem to be of benefit for her which is also good news. Overall I'm very happy with how things seem to be both regards appearance as well as measurement. Patient did see Dr. Bridgett Larsson for evaluation on 12/30/17. In his assessment he felt that stripping would not likely add much more than chronic compression to the patient's healing process. His recommendation was to follow-up in three months with Dr. Doren Custard if she hasn't healed in order to consider referral back to you and see vascular where she previously was in a trial and was able to get her wound to heal. I'll be see what she feels she when you staying compression and he reiterated this as well. 01/13/18 on evaluation today patient appears to actually be doing very well in regard to her bilateral medial malleolus ulcers. She seems to have tolerated the chemical cauterization with silver nitrate last week she did have some pain through that evening but fortunately states that I'll be see since it seems to be doing better she is overall pleased with the progress. 01/21/18; really quite a remarkable improvement since I've last seen these wounds. We started using silver nitrate specially on the islands of hyper granulation which for some reason her around the wound circumference. This is really done quite nicely. Primary dressing Hydrofera Blue under 4 layer compression. She seems to be able to hold out without a nurse rewrap. Follow-up in 1 week 01/28/18; we've  continued the hydrofera blue but continued with chemical cauterization to the wound area that we started about a month ago for irregular hyper granulation. She is made almost stunning improvement in the overall wound dimensions. I was not really expecting this degree of improvement in these chronic wounds 02/05/18; we continue with Hydrofera Bluebut of also continued the aggressive chemical cauterization with silver nitrate. We made nice progress with the right greater than left wound.  02/12/18. We continued with Hydrofera Blue after aggressive chemical cauterization with silver nitrate. We appear to be making nice progress with both wound areas 02/19/2018; we continue with [facility] after washing the wounds vigorously with Anasept spray and chemical cauterization with silver nitrate. We are making excellent progress. The area on the right's just about closed 02/26/2018. The area on the left medial ankle had too much necrotic debris today. I used a #5 curette we are able to get most of the soft. I continued with the silver nitrate to the much smaller wound on the right medial ankle she had a new area on her right lower pretibial area which she says was due to a role in her compression 03/05/2018; both wound areas look healthy. Not much change in dimensions from last week. I continue to use silver nitrate and Hydrofera Blue. The patient saw Dr. Doren Custard of vein and vascular. He felt she had venous stasis ulcers. He felt based on her previous arteriogram she should have adequate circulation for healing. Also she has deep venous reflux but really no significant correctable superficial venous reflux at this time. He felt we should continue with conservative management including leg elevation and compression 04/02/2018; since we last saw this woman about a month ago she had a fall apparently suffered a pelvic fracture. I did not look up the x-ray. Nevertheless because of pain she literally was bedbound for 2  weeks and had home health coming out to change the dressing. Somewhat predictably this is resulted in considerable improvement in both wound areas. The right is just about closed on the medial malleolus and the left is about half the size. 04/16/2018; both her wounds continue to go down in size. Using Hydrofera Blue. 05/07/18; both her wounds appeared to be improving especially on the right where it is almost closed. We are using Hydrofera Blue 05/14/2018; slightly worse this week with larger wounds. Surface on the left medial not quite as good. We have been using Hydrofera Blue 05/21/18; again the wounds are slightly larger. Left medial malleolus slightly larger with eschar around the circumference. We have been using Hydrofera Blue undergoing a wraps for a prolonged period of time. This got a lot better when she was more recumbent due to a fall and a back injury. I change the primary dressing the silver alginate today. She did not tolerate a 4 layer compression previously although I may need to bring this up with her next time 05/28/2018; area on the left medial malleolus again is slightly larger with more drainage. Area on the right is roughly unchanged. She has a small area of folliculitis on the right medial just on the lower calf. This does not look ominous. 06/03/2018 left medial malleolus slightly smaller in a better looking surface. We used silver nitrate on this last time with silver alginate. The area on the right appears slightly smaller 1/10; left medial malleolus slightly smaller. Small open area on the right. We used silver nitrate and silver alginate as of 2 weeks ago. We continue with the wound and compression. These got a lot better when she was off her feet 1/17; right medial malleolus wound is smaller. The left may be slightly smaller. Both surfaces look somewhat better. 1/24; both wounds are slightly smaller. Using silver alginate under Unna boots 1/31; both wounds appear smaller in  fact the area on the right medial is just about closed. Surface eschar. We have been using silver alginate under Unna boots. The patient is less  active now spends let much less time on her feet and I think this is contributed to the general improvement in the wound condition 2/7; both wounds appear smaller. I was hopeful the right medial would be closed however there there is still the same small open area. Slight amount of surface eschar on the left the dimensions are smaller there is eschar but the wound edges appear to be free. We have been using silver alginate under Unna boot's 2/14; both wounds once again measure smaller. Circumferential eschar on the left medial. We have been using silver alginate under Unna boots with gradual improvement 2/21; the area on the right medial malleolus has healed. The area on the left is smaller. We have been using silver alginate and Unna boots. We can discharge wrapping the right leg she has 20/30 stockings at home she will need to protect the scar tissue in this area 2/28; the area on the right medial malleolus remains closed the patient has a compression stocking. The area on the left is smaller. We have been using silver alginate and Unna boots. 3/6 the area on the right medial ankle remains closed. Good edema control noted she is using her own compression stocking. The area on the left medial ankle is smaller. We have been managing this with silver alginate and Unna boots which we will continue today. 3/13; the area on the right medial ankle remains closed and I'm declaring it healed today. When necessary the left is about the same still a healthy-looking surface but no major change and wound area. No evidence of infection and using silver alginate under unna and generally making considerable improvement 3/27 the area on the right medial ankle remains closed the area on the left is about the same as last week. Certainly not any worse we have been using  silver alginate under an Unna boot 4/3; the area on the right medial ankle remains closed per the patient. We did not look at this wound. The wound on the left medial ankle is about the same surface looks healthy we have been using silver alginate under an Unna boot 4/10; area on the right medial ankle remains closed per the patient. We did not look at this wound. The wound on the left medial ankle is slightly larger. The patient complains that the [facility] caused burning pain all week. She also told us that she was a lot more active this week. Changed her back to silver alginate 4/17; right medial ankle still closed per the patient. Left medial ankle is slightly larger. Using silver alginate. She did not tolerate Hydrofera Blue on this area 4/24; right medial ankle remains closed we have not look at this. The left medial ankle continues to get larger today by about a centimeter. We have been using silver alginate under Unna boots. She complains about 4 layer compression as an alternative. She has been up on her feet working on her garden 5/8; right medial ankle remains closed we did not look at this. The left medial ankle has increased in size about 100%. We have been using silver alginate under Unna boots. She noted increased pain this week and was not surprised that the wound is deteriorated 5/15; no major change in SA however much less erythema ( one week of doxy ocellulitis). 5/22-66 year old female returns at 1 week to the clinic for left medial ankle wound for which we have been using silver alginate under 3 layer compression She was placed on DOXY at last  visit - the wound is wider at this visit. She is in 3 layer compression 5/29; change to [facility] last week. I had given her empiric doxycycline 2 weeks ago for a week. She is in 3 layer compression. She complains of a lot of pain and drainage on presentation today. 6/5; using Hydrofera Blue. I gave her doxycycline recently  empirically for erythema and pain around the wound. Believe her cultures showed enterococcus which not would not have been well covered by doxycycline nevertheless the wound looks better and I don't feel specifically that the enterococcus needs to be covered. She has a new what looks like a wrap injury on her lateral left ankle. 6/12; she is using Hydrofera Blue. She has a new area on the left anterior lower tibial area. This was a wrap injury last week. 6/19; the patient is using Hydrofera Blue. She arrived with marked inflammation and erythema around the wound and tenderness. 12/01/18 on evaluation today patient appears to be doing a little bit better based on what I'm hearing from the standpoint of lassos evaluation to this as far as the overall appearance of the wound is concerned. Then sometime substandard she typically sees Dr. Dellia Nims. Nonetheless overall very pleased with the progress that she's made up to this point. No fevers, chills, nausea, or vomiting noted at this time. 7/10; some improvement in the surface area. Aggressively debrided last week apparently. I went ahead with the debridement today although the patient does not tolerate this very well. We have been using Iodoflex. Still a fair amount of drainage 7/17; slightly smaller. Using Iodoflex. 7/24; no change from last week in terms of surface area. We have been using Iodoflex. Surface looks and continues to look somewhat better 7/31; surface area slightly smaller better looking surface. We have been using Iodoflex. This is under Unna boot compression 8/7-Patient presents at 1 week with Unna boot and Iodoflex, wound appears better 8/14-Patient presents at 1 week with Iodoflex, we use the Unna boot, wound appears to be stable better.Patient is getting Botox treatment for the inversion of the foot for tendon release, Next week 8/21; we are using Iodoflex. Unna boot. The wound is stable in terms of surface area. Under illumination there  is some areas of the wound that appear to be either epithelialized or perhaps this is adherent slough at this point I was not really clear. It did not wipe off and I was reluctant to debride this today. 8/28; we are using Iodoflex in an Unna boot. Seems to be making good improvement. 9/4; using Iodoflex and wound is slightly smaller. 9/18; we are using Iodoflex with topical silver nitrate when she is here. The wound continues to be smaller 10/2; patient missed her appointment last week due to GI issues. She left and Iodoflex based dressing on for 2 weeks. Wound is about the same size about the size of a dime on the left medial lower 10/9 we have been using Iodoflex on the medial left ankle wound. She has a new superficial probable wrap injury on the dorsal left ankle 10/16; we have been using Hydrofera Blue since last week. This is on the left medial ankle 10/23; we have been using Hydrofera Blue since 2 weeks ago. This is on the left medial ankle. Dimensions are better 11/6; using Hydrofera Blue. I think the wound is smaller but still not closed. Left medial ankle 11/13; we have been using Hydrofera Blue. Wound is certainly no smaller this week. Also the surface not  as good. This is the remanent of a very large area on her left medial ankle. 11/20; using Sorbact since last week. Wound was about the same in terms of size although I was disappointed about the surface debris 12/11; 3-week follow-up. Patient was on vacation. Wound is measuring slightly larger we have been using Sorbact. 12/18; wound is about the same size however surface looks better last week after debridement. We have been using Sorbact under compression 1/15 wound is probably twice the size of last time increased in length nonviable surface. We have been using Sorbact. She was running a mild fever and missed her appointment last week 1/22; the wound is come down in size but under illumination still a very adherent debris we have  been Hydrofera Blue that I changed her to last week 1/29; dimensions down slightly. We have been using Hydrofera Blue 2/19 dimensions are the same however there is rims of epithelialization under illumination. Therefore more the surface area may be epithelialized 2/26; the patient's wound actually measures smaller. The wound looks healthy. We have been using Hydrofera Blue. I had some thoughts about running Apligraf then I still may do that however this looks so much better this week we will delay that for now 3/5; the wound is small but about the same as last week. We have been using Hydrofera Blue. No debridement is required today. 3/19; the wound is about the size of a dime. Healthy looking wound even under illumination. We have been using Hydrofera Blue. No mechanical debridement is necessary 3/26; not much change from last week although still looks very healthy. We have been using Hydrofera Blue under Unna boots Patient was offered an ankle fusion by podiatry but not until the wound heals with a proceed with this. 4/9; the patient comes in today with her original wound on the medial ankle looking satisfactory however she has some uncontrolled swelling in the middle part of her leg with 2 new open areas superiorly just lateral to the tibia. I think this was probably a wrap issue. She said she felt uncomfortable during the week but did not call in. We have been using Hydrofera Blue 4/16; the wound on the medial ankle is about the same. She has innumerable small areas superior to this across her mid tibia. I think this is probably folliculitis. She is also been working in the yard doing a lot of sweating 4/30; the patient issue on the upper areas across her mid tibia of all healed. I think this was excessive yard work if I remember. Her wound on the medial ankle is smaller. Some debris on this we have been using Hydrofera Blue under Unna boots 5/7; mid tibia. She has been using Hydrofera Blue  under an Unna wrap. She is apparently going for her ankle surgery on June 3 10/28/19-Patient returns to clinic with the ankle wound, we are using Hydrofera Blue under Unna wrap, surgery is scheduled for her left foot for June 3 so she will be back for nurse visit next week READMISSION 01/17/2020 Mrs. Wynes is a 66 year old woman we have had in this clinic for a long period of time with severe venous hypertension and refractory wounds on her medial lower legs and ankles bilaterally. 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 When she was off her legs for a prolonged period example when she fell  and suffered a compression fracture things would heal up quite nicely. She is now retired and we managed to heal up the right medial leg wound. The left one was very tiny last time I saw this although still refractory. 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 It got to the point where this was interfering with her walking and she elected to proceed with a ankle arthrodesis to straighten her her ankle and leave her with a functional outcome for mobilization. The patient was referred to Dr. Doren Custard and really this took some time to arrange. Dr. Doren Custard saw her on 12/07/2019. Once again he verified that she had no arterial issues. She had previously had an angiogram several years ago. Follow-up ABIs on the left showed an ABI of 1.12 with triphasic waveforms and a TBI of 0.92. She is felt to have chronic deep venous insufficiency but I do not think it was felt that anything could be done from about this from an ablation point of view. At the time Dr. Doren Custard saw this patient the wounds actually look closed via the pictures in his clinic. The patient finally underwent her surgery on 12/15/2019. This went reasonably  well and there was a good anatomic outcome. She developed a small distal wound dehiscence on the lateral part of the surgical wound. However more problematically she is developed recurrence of the wound on the medial left ankle. There are actually 2 wounds here one in the distal lower leg and 1 pretty much at the level of the medial malleolus. It is a more distal area that is more problematic. She has been using Hydrofera Blue which started on Friday before this she was simply Ace wrapping. There was a culture done that showed Pseudomonas and she is on ciprofloxacin. A recent CNS on 8/11 was negative. The patient reports some pain but I generally think this is improving. She is using a cam boot completely nonweightbearing using a walker for pivot transfers and a wheelchair 8/24; not much improvement unfortunately she has a surgical wound on the lateral part in the venous insufficiency wound medially. The bottom part of the medial insufficiency wound is still necrotic there is exposed tendon here. We have been using Hydrofera Blue under compression. Her edema control is however better 8/31; patient in for follow-up of his surgical wound on the lateral part of her left leg and chronic venous insufficiency ulcers medially. We put her back in compression last week. She comes in today with a complaint of 3 or 4 days worth of increasing pain. She felt her cam walker was rubbing on the area on the back of her heel. However there is intense erythema seems more likely she has cellulitis. She had 2 cultures done when she was seeing podiatry in the postop. One of them in late July showed Pseudomonas and she received a course of ciprofloxacin the other was negative on 8/11 she is allergic to penicillin with anaphylactoid complaints of hives oral swelling via information in epic 9/9; when I saw this patient last week she had intense anterior erythema around her wound on the right lateral heel and ankle and also  into the right medial heel. Some of this was no doubt drainage and her walker boot however I was convinced she had cellulitis. I gave her Levaquin and Bactrim she is finishing up on this now. She is following up with Dr. [name] he saw her yesterday.  He is taken her out of the walking boot of course she is still nonweightbearing. Her x-ray was negative for any worrisome features such as soft tissue air etc. Things are a lot better this week. She has home health. We have been using Hydrofera Blue under an [employer] which she put back on yesterday. I did not wrap her last week 9/17; her surrounding skin looks a lot better. In fact the area on the left lateral ankle has just a scant amount of eschar. The only remaining wound is the large area on the left medial ankle. Probably about 60% of this is healthy granulation at the surface however she has a significant divot distally. This has adherent debris in it. I been using debridement and silver collagen to try and get this area to fill-in although I do not think we have made much progress this week 9/24; the patient's wound on the left medial ankle looks a lot better. The deeper divot area distally still requires debridement but this is cleaning up quite nicely we have been using silver collagen. The patient is complaining of swelling in her foot and is worried that that is contributing to the nonhealing of the ankle wound. She is also complaining of numbness in her anterior toes 10/4; left medial ankle. The small area distally still has a divot with necrotic material that I have been debriding away. This has an undermining area. She is approved for Apligraf. She saw Dr. [name] her surgeon on 10/1. I think he declared himself is satisfied with the condition of things. Still nonweightbearing till the end of the month. 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 Her surgical wound is well closed. There is no evidence of  infection 10/11; the patient arrived in clinic today with the expectation that we be able to put an Apligraf on this area after debridement however she arrives with a relatively large amount of green drainage on the dressing. The patient states that this started on Friday. She has not been systemically unwell. 10/19; culture I did last week showed both Enterococcus and Pseudomonas. I think this came in separate parts because I stopped her ciprofloxacin I gave her and prescribed her linezolid however now looking at the final culture result this is Pseudomonas which is resistant to quinolones. She has not yet picked up the linezolid apparently phone issues. We are also trying to get a topical antibiotic out of [place] in [place] they can be applied by home health. She is still having green drainage 10/16; the patient has her topical antibiotic from [facility] in [place]. This is a compounded gel with vancomycin and ciprofloxacin and gentamicin. We are applying this on the wound bed with silver alginate over the top with Unna boot wraps. She arrives in clinic today with a lot less ominous looking drainage although she is only use this topical preparation once the second time today. She sees Dr. [name] her surgeon on Friday she has home health changing the dressing 11/2; still using her compounded topical antibiotic under silver alginate. Surface is cleaning up there is less drainage. We had an Apligraf for her today and I elected to apply it. A light coating of her antibiotic 04/25/2020 upon evaluation today patient appears to be doing well with regard to her ankle ulcer. There is a little bit of slough noted on the surface of the wound I am can have to perform sharp debridement to clear this away today. With  that being said other than that fact overall I feel like she is making progress and we do see some new epithelial growth. There is also some improvement in the depth of the wound and  that distal portion. There is little bit of slough there as well. 12/7; 2-week follow-up. Apligraf #3. Dimensions are smaller. Closing in especially inferiorly. Still some surface debris. Still using the [facility] topical antibiotic but I told her that I don't think this needs to be renewed 12/21; 2-week follow-up. Apligraf #4 dimensions are smaller. Nice improvement 06/05/2020; 2-week follow-up. The patient's wound on the left medial ankle looks really excellent. Nice granulation. Advancing epithelialization no undermining no evidence of infection. We would have to reapply for another Apligraf but with the condition of this wound I did not feel strongly about it. We used Hydrofera Blue under the same degree of compression. She follows up with Dr. [name] her surgeon a week Friday 06/13/2020 upon evaluation today patient appears to be doing excellent in regard to her wound. She has been tolerating the dressing changes without complication. Fortunately there is no signs of active infection at this time. No fevers, chills, nausea, vomiting, or diarrhea. She was using Hydrofera Blue last week. 06/20/2020 06/20/2020 on evaluation today patient actually appears to be doing excellent in regard to her wound. This is measuring better and looking much better as well. She has been using the collagen that seems to be doing better for her as well even though the [facility] was and is not sticking or feeling as rough on her wound. She did see Dr. [name] on Friday he is very pleased he also stated none of the hardware has shifted. That is great news 1/27; the patient has a small clean wound all that is remaining. I agree that this is too small to really consider an Apligraf. Under illumination the surface is looking quite good. We have been using collagen although the dimensions are not any better this week 2/2; the patient has a small clean wound on the left medial ankle. Although this left of her substantial original  areas. Measurements are smaller. We have been using polymen Ag under an Haematologist. 2/10; small area on the left medial ankle. This looks clean nothing to debride however dimensions are about the same we have been using polymen I think now for 2 weeks 2/17; not much change in surface area. We have been using polymen Ag without any improvement. 3/17; 1 month follow-up. The patient has been using endoform without any improvement in fact I think this looks worse with more depth and more expansion 3/24; no improvement. Perhaps less debris on the surface. We have been using Sorbact for 1 week 4/4; wound measures larger. She has edema in her leg and her foot which she tells as her wrap came down. We have been using Unna boots. Sorbact of the wound. She has been approved for Apligraf 09/12/2020 upon evaluation today patient appears to be doing well with regard to her wound. We did get the Apligraf reapproved which is great news we have that available for application today. Fortunately there is no signs of infection and overall the patient seems to be doing great. The wound bed is nice and clean. 4/27; patient presents for her second application of Apligraf. She states over the past week she has been on her feet more often due to being outside in her garden. She has noted more swelling to her foot as a result. She denies increased  warmth, pain or erythema to the wound site. 10/10/2020 upon evaluation today patient appears to be doing well with regard to her wound which does not appear to be quite as irritated as last week from what I am hearing. With that being said unfortunately she is having issues with some erythema and warmth to touch as well as an increase size. I do believe this likely is infected. 10/17/2020 upon evaluation today patient appears to be doing excellent in regard to her wound this is significantly improved as compared to last week. Fortunately I think that the infection is much better  controlled at this point. She did have evidence of both Enterococcus as well as Staphylococcus noted on culture. Enterococcus really would not be helped significantly by the Cipro but the wound is doing so much better I am under the assumption that the Staphylococcus is probably the main organism that is causing the current infection. Nonetheless I think that she is doing excellent as far as that is concerned and I am very pleased in that regard. I would therefore recommend she continue with the Cipro. 10/31/2020 upon evaluation today patient appears to be doing well with regard to her wound. She has been tolerating the dressing changes without complication. Fortunately there is no signs of active infection and overall I am extremely pleased with where things stand today. No fevers, chills, nausea, vomiting, or diarrhea. With that being said she does have some green drainage coming from the wound and although it looks okay I am a little concerned about the possibility of a continuing infection. Specifically with Pseudomonas. For that reason I will go ahead and send in a prescription for Cipro for her to be continued. 11/14/2020 upon evaluation today patient appears to be doing very well currently in regard to her wound on her leg. She has been tolerating the dressing changes without complication. Fortunately I feel like the infection is finally under good control here. Unfortunately we do not have the Apligraf for application today although we can definitely order to have it in place for next week. That will be her fifth and final of the current series. Nonetheless I feel like her wound is really doing quite well which is great news. 11/21/2020 upon evaluation today patient appears to be doing well with regard to her wound on the medial ankle. Fortunately I think the infection is under control and I do believe we can go ahead and reapply the Apligraf today. She is in agreement with that plan. There does not  appear to be any signs of active infection at this time which is great news. No fevers, chills, nausea, vomiting, or diarrhea. 12/05/2020 upon evaluation today patient's wound bed actually showed signs of good granulation epithelization at this point. There does not appear to be any signs of infection which is great news and overall very pleased with where things stand. Overall the patient seems to be doing fairly well in my opinion with regard to her wound although I do believe she continues to build up a lot of biofilm I think she could benefit from using PuraPly at this point. 12/12/2020 upon evaluation today patient's wound actually appears to be doing decently well today. The Unna boot has not been quite as well-tolerated so that more uncomfortable for her and even causing some pressure over the plate on the lateral portion of her foot which is 90 where the wound is. There did not appear to be any significant deep tissue injury with that there  may be a minimal change in the skin noted I think that we may want to go back to the Coflex 2 layer which is a little bit easier on her skin it seems. 12/19/2020 upon evaluation today patient actually seems to be making great progress with the PuraPly currently. She in fact seems to be much better as far as the overall appearance of the wound bed is concerned I am very happy in this regard. I do not see any signs of of infection which is great news as well. No fevers, chills, nausea, vomiting, or diarrhea. 12/26/2020 upon evaluation today patient appears to actually be doing better in regard to her wound on the left medial ankle region. The surface of the wound is actually doing significantly better which is great news. There does not appear to be any signs of infection which is also great news and in general I am extremely pleased with where we stand today. 01/02/2021 upon evaluation today patient appears to be doing well with regard to her wound. In fact this is  showing signs of excellent improvement and very pleased with where things stand. In fact the last 3 appointments have all shown signs of this getting smaller which is excellent news. I have not even had to perform any debridement and today is no exception. Overall I feel like this is dramatically improved compared to previous. T oday is PuraPly application #4. 2/35/3614 upon evaluation today patient appears to be doing excellent in regard to her wound this is continue to show signs of improvement and overall I am extremely pleased with where we stand today. She is actually here for PuraPly application #5. Every time we have applied this she is noted definite improvement on measurements. 01/23/2021 upon evaluation today patient is actually making good progress in regard to her wound. This was actually on just a little bit longer this time compared to previous due to the fact that she did have to go out of town. She is actually here for PuraPly application #6. We have definitely been seeing improvements in the overall quality of the tissue on the surface of the wound which is awesome news. In general I think that the patient seems to be continuing to make great progress here. 01/30/2021 upon evaluation today patient's wound is actually doing excellent. There is really not any significant biofilm buildup which is great news and overall I am extremely pleased with where things stand today. There does not appear to be any signs of active infection. No fevers, chills, nausea, vomiting, or diarrhea. 02/06/2021 upon evaluation today patient's wound is actually showing signs of excellent improvement which is great news. We continue to see the benefit of the PuraPly this is doing a great job the wound seems not really irritated whatsoever and is showing signs of good granulation at this point. Overall I am extremely happy with what we are seeing. The patient likewise is happy to hear all of this as well. 02/13/2021  upon evaluation today patient appears to be doing well with regard to her wound. This again is measuring smaller today and I am very pleased in this regard. Fortunately there does not appear to be any signs of active infection at this time which is good news from a systemic standpoint. Locally there is still some significant drainage which she does have is concerned about infection locally. No fevers, chills, nausea, vomiting, or diarrhea. 02/20/2021 upon evaluation today patient actually appears to be doing decently well in regard to  her wound. She has been tolerating the dressing changes without complication. I do believe the PuraPly is helping wound bed does appear to be doing better. There is no evidence of active infection locally or systemically at this point visually although on fluorescence imaging there still appear to be bacterial load and bioburden noted. 02/27/2021 upon evaluation today patient fortunately continues to show signs of improvement with use of the PuraPly currently. Subsequently we did review her culture results and to be honest I think that the Bactrim is probably the best option to have her continue at this point. For that reason I am get a go ahead and send in a refill today for her for an additional 2 weeks. Nonetheless I think that we are making excellent progress here. It was Enterococcus and Staphylococcus that were noted she is allergic to penicillin so there is not much I can do from the Enterococcus standpoint though the staff does seem to be sensitive to the Bactrim. 03/06/2021 upon evaluation today patient appears to be doing well with regard to her wound. This is definitely showing signs of improvement which is great news and overall I am extremely pleased with where things stand today. There does not appear to be any signs of active infection which is great news and overall and I do believe that we are headed in the appropriate direction I think the PuraPly is doing  an awesome job for her. 03/20/2021 upon evaluation today patient actually appears to be doing well with regard to her wound. We have been using the PuraPly although last week when I was out of the office they actually just used endoform nonetheless this still seems to be doing great. Fortunately there does not appear to be any signs of active infection which is great news overall I think the patient is making wonderful progress. 03/27/2021 upon evaluation today patient appears to be doing well with regard to her wound in fact there is a little drainage but otherwise her does not appear to be any major signs of open wounds nor infection at this time. Overall I am extremely pleased with where things stand. No fevers, chills, nausea, vomiting, or diarrhea. 04/03/2021 upon evaluation today patient presents for reevaluation here in the clinic and overall she seems to be doing quite well. Fortunately there does not appear to be any signs of active infection which is great news and in general I am very pleased with how things appear. There is still a very small opening remaining but in general she is looking much better. Fortunately I do not think there is any need for the PuraPly at this point. 04/10/2021 upon evaluation today patient's wound is actually showing signs of excellent improvement. I am actually very pleased with where it stands today. I think she is very close to complete closure in fact this may be healed today. Either way I think that she may benefit from using her own compression socks for the next week and then will subsequently see how things stand following. 04/17/2021 upon evaluation today patient appears to be doing well with regard to her wound on the ankle region. This is actually showing signs of excellent improvement which is great news. I do not see any signs of active infection at this time which is great and no open wound. With that being said on the lateral portion of her ankle  right at the base of her plate she actually has an area of pressure that occurred over the past week  when she was not in the wrap or we been padding her. This does seem to be an issue which I think is good to be an ongoing problem I am can actually get in touch with Dr. [name] today to see what we can do in that regard. 04/24/2021 upon evaluation today patient appears to be doing well with regard to her ankle region for the most part although there does appear to be some fluid trapped underneath the area where we thought this was healed last week. Unfortunately I think at this time that we can have to reopen and clean away some of this area and then subsequently depending on how things do over the next week or so we will see about discharge but right now I do not think that is the right way to go. She voiced understanding. 05/01/2021 upon evaluation today patient's wound still appears to be open unfortunately. I do believe that we do need to see what we can do about trying to get this closed I believe compression wrapping is probably can to be indicated here. Fortunately there is no signs of active infection at this time. 05/08/2021 upon evaluation today patient appears to be doing okay in regard to her ulcer this is very superficial and we will see any major issues here. The biggest problem currently that I find is that we just cannot get this area to completely seal up. She seemed to almost be and then things digressed a little bit. Right now is not too bad but we do need to see if we can make some difference here. I think maybe switching over to collagen may be better. 05/15/2021 upon evaluation today patient actually appears to be doing pretty well in regard to her wound. This is measuring a little smaller and looking much better. She did see orthopedics today there to me making her a insert for her shoe to try to help straighten out her gait. I think this can be beneficial for her  as well. 05/22/2021 upon evaluation today patient appears to be doing well currently in regard to her wound although is not significantly smaller unfortunately this at least does not show any signs of active infection at this time which is great news. No fevers, chills, nausea, vomiting, or diarrhea. I am going to see about switching over to endoform that is what we wanted to use in the past always been on backorder. 06/05/2021 upon evaluation today patient appears to be doing somewhat poorly in regard to her wound. This is really not showing the signs of improvement that I would have like to have seen. Fortunately I do not see any signs of active infection locally nor systemically which is great news. With that being said even though I do not see the sign she is having some increased discomfort which is not good. She is also having some issues here with the wound looking a little larger today compared to where its been. With that being said overall I believe that we probably need to consider at least that there may be a subacute infection here and that is what is going on. We have tried multiple different medications and dressings which have done well for her in the past without seeing any signs of improvement. She is in agreement with addressing this as such. 06/12/2020 upon evaluation today patient's wound is actually showing some signs of improvement today. In fact I do not even see any need for sharp debridement based on what I  see at this point today. Fortunately there is no evidence of active infection currently which is great news. No fevers, chills, nausea, vomiting, or diarrhea. 06/26/2021 upon evaluation today patient appears to be doing well with regard to her wounds. Fortunately there does not appear to be any signs of active infection at this time which is great news. I do believe the Bactrim is doing well for her. Fortunately there does not appear to be any signs of active infection  at this time. With the hardware in place and everything else I do believe extending this for 2 additional weeks for 4 weeks total would be advisable. Objective Constitutional Well-nourished and well-hydrated in no acute distress. Vitals Time Taken: 10:09 AM, Height: 68 in, Temperature: 97.9 F, Pulse: 65 bpm, Respiratory Rate: 20 breaths/min, Blood Pressure: 115/48 mmHg. Respiratory normal breathing without difficulty. Psychiatric this patient is able to make decisions and demonstrates good insight into disease process. Alert and Oriented x 3. pleasant and cooperative. General Notes: Upon inspection patient's wound bed actually showed signs of good granulation and epithelization at this point. Fortunately I do not see any signs of active infection at this time which is great news and overall I am extremely pleased with where we stand today. I am getting go ahead and see about checking into PuraPly for her in the past this did very well. Integumentary (Hair, Skin) Wound #15R status is Open. Original cause of wound was Gradually Appeared. The date acquired was: 12/30/2019. The wound has been in treatment 75 weeks. The wound is located on the Left,Medial Malleolus. The wound measures 1.2cm length x 0.7cm width x 0.2cm depth; 0.66cm^2 area and 0.132cm^3 volume. There is Fat Layer (Subcutaneous Tissue) exposed. There is no tunneling or undermining noted. There is a medium amount of serosanguineous drainage noted. The wound margin is distinct with the outline attached to the wound base. There is large (67-100%) red granulation within the wound bed. There is no necrotic tissue within the wound bed. 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 There was a Selective/Open Wound Skin/Epidermis Debridement with a total area of 0.84 sq cm performed by [name], PA. With the following instrument(s): Curette to remove Non-Viable tissue/material. Material removed includes Callus and Skin: Epidermis and after achieving pain control using Lidocaine 4% T opical Solution. No specimens were taken. A time out was conducted at 10:55, prior to the start of the procedure. There was no bleeding. The procedure was tolerated well with a pain level of 0 throughout and a pain level of 3 following the procedure. Post Debridement Measurements: 1.2cm length x 0.7cm width x 0.2cm depth; 0.132cm^3 volume. Character of Wound/Ulcer Post Debridement is improved. Severity of Tissue Post Debridement is: Fat layer exposed. Post procedure Diagnosis Wound #15R: Same as Pre-Procedure Pre-procedure diagnosis of Wound #15R is a Venous Leg Ulcer located on the Left,Medial Malleolus . There was a Double Layer Compression Therapy Procedure by Baruch Gouty, RN. Post procedure Diagnosis Wound #15R: Same as Pre-Procedure Plan Follow-up Appointments: Return Appointment in 1 week. Margarita Grizzle Cellular or Tissue Based Products: Cellular or Tissue Based Product Type: - run IVR for Puraply Bathing/  Shower/ Hygiene: May shower with protection but do not get wound dressing(s) wet. Edema Control - Lymphedema / SCD / Other: Elevate legs to the level of the heart or above for 30 minutes daily and/or when sitting, a frequency of: - throughout the day when sitting Avoid standing for long periods of time. Patient to wear own compression stockings every day. Exercise regularly Moisturize legs daily. Non Wound Condition: Apply the following to affected area as directed: - Apply silicone bordered foam to left lateral foot for  protection The following medication(s) was prescribed: Bactrim DS oral 800 mg-160 mg tablet 1 1 tablet oral taken 2 times per day for 14 days starting 06/26/2021 WOUND #15R: - Malleolus Wound Laterality: Left, Medial Cleanser: Wound Cleanser (Generic) 1 x Per Day/15 Days Discharge Instructions: Cleanse the wound with wound cleanser prior to applying a clean dressing using gauze sponges, not tissue or cotton balls. Peri-Wound Care: Sween Lotion (Moisturizing lotion) 1 x Per Day/15 Days Discharge Instructions: Apply moisturizing lotion as directed Prim Dressing: Hydrofera Blue Ready Foam, 2.5 x2.5 in 1 x Per Day/15 Days ary Discharge Instructions: Apply to wound bed as instructed Secondary Dressing: Woven Gauze Sponge, Non-Sterile 4x4 in (Generic) 1 x Per Day/15 Days Discharge Instructions: Apply over primary dressing as directed. Com pression Wrap: CoFlex TLC XL 2-layer Compression System 4x7 (in/yd) 1 x Per Day/15 Days Discharge Instructions: Apply CoFlex 2-layer compression as directed. (alt for 4 layer) 1. Would recommend currently that we go ahead and continue with the wound care measures as before for the time being and the patient is in agreement with plan. This includes the use of the [facility] which I think is doing a good job. 2. Also can recommend that we have her continue with the Bactrim for a little bit longer. I am going to send in a refill for the prescription today and she is in agreement with that plan. That will be for an additional 2 weeks which will be 4 weeks total. 3. I am also going to go ahead and see about looking into PuraPly as a possibility in the past she did very well with this and I think that she may do so again. We will see if we get that approved. We will see patient back for reevaluation in 1 week here in the clinic. If anything worsens or changes patient will contact our office for additional recommendations. Electronic Signature(s) Signed: 06/26/2021  11:12:11 AM By: [name] PA-C Entered By: [name] on 06/26/2021 11:12:11 -------------------------------------------------------------------------------- SuperBill Details Patient Name: Date of Service: [facility] [name], [name] 06/26/2021 Medical Record Number: [id number] Patient Account Number: [id number] Date of Birth/Sex: Treating RN: 1955/11/17 (66 y.o. [name] Provider: [name] Other Clinician: Referring Provider: Treating Provider/Extender: [name] in Treatment: 40 Diagnosis Coding ICD-10 Codes Code Description [phone] Chronic venous hypertension (idiopathic) with ulcer and inflammation of left lower extremity L97.828 Non-pressure chronic ulcer of other part of left lower leg with other specified severity L97.328 Non-pressure chronic ulcer of left ankle with other specified severity Facility Procedures CPT4 Code: [id number] Description: 60109 - DEBRIDE WOUND 1ST 20 SQ CM OR < ICD-10 Diagnosis Description L97.328 Non-pressure chronic ulcer of left ankle with other specified severity Modifier: Quantity: 1 Physician Procedures : CPT4 Code Description Modifier [id number] - WC PHYS LEVEL 4 - EST PT 25 ICD-10 Diagnosis Description I87.332 Chronic venous hypertension (idiopathic) with ulcer and inflammation of left lower extremity L97.828 Non-pressure  chronic ulcer of other  part of left lower leg with other specified severity L97.328 Non-pressure chronic ulcer of left ankle with other specified severity Quantity: 1 : [id number] - WC PHYS DEBR WO ANESTH 20 SQ CM ICD-10 Diagnosis Description L97.328 Non-pressure chronic ulcer of left ankle with other specified severity Quantity: 1 Electronic Signature(s) Signed: 06/26/2021 11:12:31 AM By: [name] PA-C Entered By: [name] on 06/26/2021 11:12:31

## 2021-06-27 NOTE — Progress Notes (Signed)
[name], [name] ([id number]) Visit Report for 06/26/2021 Arrival Information Details Patient Name: Date of Service: [facility], [name] 06/26/2021 10:15 A M Medical Record Number: [id number] Patient Account Number: [id number] Date of Birth/Sex: Treating RN: 1956-05-09 (66 y.o. [name] Roan Sawchuk: [name] Other Clinician: Referring Birdie Beveridge: Treating [name]/Extender: [name] in Treatment: 79 Visit Information History Since Last Visit Added or deleted any medications: No Patient Arrived: [name] Any new allergies or adverse reactions: No Arrival Time: 10:08 Had a fall or experienced change in No Accompanied By: Self activities of daily living that may affect Transfer Assistance: None risk of falls: Patient Identification Verified: Yes Signs or symptoms of abuse/neglect since last visito No Secondary Verification Process Completed: Yes Hospitalized since last visit: No Patient Requires Transmission-Based Precautions: No Implantable device outside of the clinic excluding No Patient Has Alerts: Yes cellular tissue based products placed in the center Patient Alerts: L ABI =1.12, TBI = .92 since last visit: R ABI= 1.02, TBI= .58 Has Dressing in Place as Prescribed: Yes Pain Present Now: Yes Electronic Signature(s) Signed: 06/27/2021 3:23:49 PM By: [name] Entered By: [name] on 06/26/2021 10:08:56 -------------------------------------------------------------------------------- Compression Therapy Details Patient Name: Date of Service: [name], [name] 06/26/2021 10:15 A M Medical Record Number: [id number] Patient Account Number: [id number] Date of Birth/Sex: Treating RN: 30-Jun-1955 (65 y.o. [name] Roi Jafari: [name] Other Clinician: Referring Juana Montini: Treating Nora Rooke/Extender: [name] in Treatment: 31 Compression Therapy Performed for Wound  Assessment: Wound #15R Left,Medial Malleolus Performed By: Clinician Baruch Gouty, RN Compression Type: Double Layer Post Procedure Diagnosis Same as Pre-procedure Electronic Signature(s) Signed: 06/26/2021 4:46:14 PM By: Baruch Gouty RN, BSN Entered By: Baruch Gouty on 06/26/2021 10:53:03 -------------------------------------------------------------------------------- Encounter Discharge Information Details Patient Name: Date of Service: [facility] [name], [name] 06/26/2021 10:15 A M Medical Record Number: [id number] Patient Account Number: [id number] Date of Birth/Sex: Treating RN: 1956/02/18 (66 y.o. [name] Suhaan Perleberg: [name] Other Clinician: Referring Alvis Pulcini: Treating Zaccheus Edmister/Extender: [name] in Treatment: 66 Encounter Discharge Information Items Post Procedure Vitals Discharge Condition: Stable Temperature (F): 97.9 Ambulatory Status: Cane Pulse (bpm): 65 Discharge Destination: Home Respiratory Rate (breaths/min): 18 Transportation: Private Auto Blood Pressure (mmHg): 115/48 Accompanied By: self Schedule Follow-up Appointment: Yes Clinical Summary of Care: Patient Declined Electronic Signature(s) Signed: 06/26/2021 4:46:14 PM By: Baruch Gouty RN, BSN Entered By: Baruch Gouty on 06/26/2021 11:16:22 -------------------------------------------------------------------------------- Lower Extremity Assessment Details Patient Name: Date of Service: [facility] [name], [name] 06/26/2021 10:15 A M Medical Record Number: [id number] Patient Account Number: [id number] Date of Birth/Sex: Treating RN: 1956/01/30 (66 y.o. [name] Primary Care [name]: [name] Other Clinician: Referring [name]: Treating [name]/Extender: [name] in Treatment: 75 Edema Assessment Assessed: [Left: Yes] [Right: No] Edema: [Left: N] [Right: o] Calf Left: Right: Point of Measurement: 31  cm From Medial Instep 27.8 cm Ankle Left: Right: Point of Measurement: 11 cm From Medial Instep 20.5 cm Vascular Assessment Pulses: Dorsalis Pedis Palpable: [Left:Yes] Electronic Signature(s) Signed: 06/26/2021 4:52:26 PM By: Deon Pilling RN, BSN Entered By: Deon Pilling on 06/26/2021 10:10:51 -------------------------------------------------------------------------------- Multi-Disciplinary Care Plan Details Patient Name: Date of Service: [name], [name] 06/26/2021 10:15 A M Medical Record Number: [id number] Patient Account Number: [id number] Date of Birth/Sex: Treating RN: 02-04-1956 (66 y.o. [name] Primary Care Ronzell Laban: [name] Other Clinician:  Referring Joahan Swatzell: Treating [name]/Extender: [name] in Treatment: 52 Multidisciplinary Care Plan reviewed with physician Active Inactive Venous Leg Ulcer Nursing Diagnoses: Actual venous Insuffiency (use after diagnosis is confirmed) Knowledge deficit related to disease process and management Goals: Patient will maintain optimal edema control Date Initiated: 06/20/2020 Target Resolution Date: 07/10/2021 Goal Status: Active Interventions: Assess peripheral edema status every visit. Compression as ordered Treatment Activities: Therapeutic compression applied : 06/20/2020 Notes: 01/30/21: Edema control ongoing, using CoFlex wrap. 06/05/21: Edema control continues Wound/Skin Impairment Nursing Diagnoses: Impaired tissue integrity Knowledge deficit related to ulceration/compromised skin integrity Goals: Patient/caregiver will verbalize understanding of skin care regimen Date Initiated: 01/17/2020 Target Resolution Date: 07/10/2021 Goal Status: Active Ulcer/skin breakdown will have a volume reduction of 30% by week 4 Date Initiated: 01/17/2020 Date Inactivated: 03/12/2020 Target Resolution Date: 03/09/2020 Goal Status: Met Interventions: Assess patient/caregiver ability to obtain  necessary supplies Assess patient/caregiver ability to perform ulcer/skin care regimen upon admission and as needed Assess ulceration(s) every visit Provide education on ulcer and skin care Notes: 01/30/21: Wound care regimen continues, PuraPly skin sub being used. 04/17/21: Wound healed 06/05/21: Wound re-opened, wound care regimen continues. Electronic Signature(s) Signed: 06/26/2021 4:52:26 PM By: Deon Pilling RN, BSN Entered By: Deon Pilling on 06/26/2021 10:17:59 -------------------------------------------------------------------------------- Pain Assessment Details Patient Name: Date of Service: [name], [name] 06/26/2021 10:15 A M Medical Record Number: [id number] Patient Account Number: [id number] Date of Birth/Sex: Treating RN: [birth year] (66 y.o. [name] Primary Care Massiah Minjares: [name] Other Clinician: Referring Mirabelle Cyphers: Treating Kharma Sampsel/Extender: [name] in Treatment: 78 Active Problems Location of Pain Severity and Description of Pain Patient Has Paino No Site Locations Rate the pain. Current Pain Level: 0 Pain Management and Medication Current Pain Management: Medication: No Cold Application: No Rest: No Massage: No Activity: No T.E.N.S.: No Heat Application: No Leg drop or elevation: No Is the Current Pain Management Adequate: Adequate How does your wound impact your activities of daily livingo Sleep: No Bathing: No Appetite: No Relationship With Others: No Bladder Continence: No Emotions: No Bowel Continence: No Work: No Toileting: No Drive: No Dressing: No Hobbies: No Engineer, maintenance) Signed: 06/26/2021 4:52:26 PM By: Deon Pilling RN, BSN Entered By: Deon Pilling on 06/26/2021 10:10:36 -------------------------------------------------------------------------------- Patient/Caregiver Education Details Patient Name: Date of Service: [name], [name] 1/25/2023andnbsp10:15 A M Medical  Record Number: [id number] Patient Account Number: [id number] Date of Birth/Gender: Treating RN: Jul 02, 1955 (66 y.o. [name] Primary Care Physician: [name] Other Clinician: Referring Physician: Treating Physician/Extender: [name] in Treatment: 6 Education Assessment Education Provided To: Patient Education Topics Provided Wound/Skin Impairment: Handouts: Skin Care Do's and Dont's Methods: Explain/Verbal Responses: Reinforcements needed Electronic Signature(s) Signed: 06/26/2021 4:52:26 PM By: Deon Pilling RN, BSN Entered By: Deon Pilling on 06/26/2021 10:18:15 -------------------------------------------------------------------------------- Wound Assessment Details Patient Name: Date of Service: [name], [name] 06/26/2021 10:15 A M Medical Record Number: [id number] Patient Account Number: [id number] Date of Birth/Sex: Treating RN: 30-Sep-1955 (66 y.o. [name] Shanece Cochrane: [name] Other Clinician: Referring Lochlyn Zullo: Treating Tej Murdaugh/Extender: [name] in Treatment: 46 Wound Status Wound Number: 15R Primary Venous Leg Ulcer Etiology: Wound Location: Left, Medial Malleolus Wound Status: Open Wounding Event: Gradually Appeared Comorbid Peripheral Venous Disease, Osteoarthritis, Confinement Date Acquired: 12/30/2019 History: Anxiety Weeks Of Treatment: 75 Clustered Wound: No Photos Wound Measurements Length: (cm) 1.2 Width: (cm) 0.7 Depth: (cm) 0.2 Area: (cm)  0.66 Volume: (cm) 0.132 % Reduction in Area: 94.3% % Reduction in Volume: 98.6% Epithelialization: Medium (34-66%) Tunneling: No Undermining: No Wound Description Classification: Full Thickness With Exposed Support Structures Wound Margin: Distinct, outline attached Exudate Amount: Medium Exudate Type: Serosanguineous Exudate Color: red, brown Foul Odor After Cleansing: No Slough/Fibrino No Wound  Bed Granulation Amount: Large (67-100%) Exposed Structure Granulation Quality: Red Fascia Exposed: No Necrotic Amount: None Present (0%) Fat Layer (Subcutaneous Tissue) Exposed: Yes Tendon Exposed: No Muscle Exposed: No Joint Exposed: No Bone Exposed: No Treatment Notes Wound #15R (Malleolus) Wound Laterality: Left, Medial Cleanser Wound Cleanser Discharge Instruction: Cleanse the wound with wound cleanser prior to applying a clean dressing using gauze sponges, not tissue or cotton balls. Peri-Wound Care Sween Lotion (Moisturizing lotion) Discharge Instruction: Apply moisturizing lotion as directed Topical Primary Dressing Hydrofera Blue Ready Foam, 2.5 x2.5 in Discharge Instruction: Apply to wound bed as instructed Secondary Dressing Woven Gauze Sponge, Non-Sterile 4x4 in Discharge Instruction: Apply over primary dressing as directed. Secured With Compression Wrap CoFlex TLC XL 2-layer Compression System 4x7 (in/yd) Discharge Instruction: Apply CoFlex 2-layer compression as directed. (alt for 4 layer) Compression Stockings Add-Ons Electronic Signature(s) Signed: 06/26/2021 4:46:14 PM By: Baruch Gouty RN, BSN Signed: 06/26/2021 4:52:26 PM By: Deon Pilling RN, BSN Entered By: Deon Pilling on 06/26/2021 10:11:17 -------------------------------------------------------------------------------- Vitals Details Patient Name: Date of Service: [name], [name] 06/26/2021 10:15 A M Medical Record Number: [id number] Patient Account Number: [id number] Date of Birth/Sex: Treating RN: 1955/06/22 (66 y.o. [name] Foday Cone: [name] Other Clinician: Referring Kolina Kube: Treating Aithan Farrelly/Extender: [name] in Treatment: 48 Vital Signs Time Taken: 10:09 Temperature (F): 97.9 Height (in): 68 Pulse (bpm): 65 Respiratory Rate (breaths/min): 20 Blood Pressure (mmHg): 115/48 Reference Range: 80 - 120 mg / dl Electronic  Signature(s) Signed: 06/27/2021 3:23:49 PM By: [name] Entered By: [name] on 06/26/2021 10:10:09

## 2021-07-03 ENCOUNTER — Encounter (HOSPITAL_BASED_OUTPATIENT_CLINIC_OR_DEPARTMENT_OTHER): Payer: Medicare Other | Attending: Physician Assistant | Admitting: Physician Assistant

## 2021-07-03 ENCOUNTER — Other Ambulatory Visit: Payer: Self-pay

## 2021-07-03 DIAGNOSIS — L97828 Non-pressure chronic ulcer of other part of left lower leg with other specified severity: Secondary | ICD-10-CM | POA: Insufficient documentation

## 2021-07-03 DIAGNOSIS — I87332 Chronic venous hypertension (idiopathic) with ulcer and inflammation of left lower extremity: Secondary | ICD-10-CM | POA: Insufficient documentation

## 2021-07-03 DIAGNOSIS — L97328 Non-pressure chronic ulcer of left ankle with other specified severity: Secondary | ICD-10-CM | POA: Insufficient documentation

## 2021-07-03 NOTE — Progress Notes (Addendum)
CHEALSEA, PASKE ([id number]) Visit Report for 07/03/2021 Chief Complaint Document Details Patient Name: Date of Service: [name], [name] 07/03/2021 10:15 A M Medical Record Number: [id number] Patient Account Number: [id number] Date of Birth/Sex: Treating RN: 1956-01-16 (66 y.o. [name] Provider: [name] Other Clinician: Referring Provider: Treating Provider/Extender: [name] in Treatment: 35 Information Obtained from: Patient Chief Complaint patient is been followed long-term in this clinic for venous insufficiency ulcers with inflammation, hypertension and ulceration over the medial ankle bilaterally. 01/17/2020; this is a patient who is here for review of postoperative wounds on the left lateral ankle and recurrence of venous stasis ulceration on the left medial Electronic Signature(s) Signed: 07/03/2021 10:26:48 AM By: [name] PA-C Entered By: [name] on 07/03/2021 10:26:48 -------------------------------------------------------------------------------- Cellular or Tissue Based Product Details Patient Name: Date of Service: [name], [name] 07/03/2021 10:15 A M Medical Record Number: [id number] Patient Account Number: [id number] Date of Birth/Sex: Treating RN: 10-Mar-1956 (66 y.o. [name] Provider: [name] Other Clinician: Referring Provider: Treating Provider/Extender: [name] in Treatment: 83 Cellular or Tissue Based Product Type Wound #15R Left,Medial Malleolus Applied to: Performed By: Physician [name], PA Cellular or Tissue Based Product Type: Puraply AM Level of Consciousness (Pre-procedure): Awake and Alert Pre-procedure Verification/Time Out Yes - 10:30 Taken: Location: trunk / arms / legs Wound Size (sq cm): 0.7 Product Size (sq cm): 4 Waste Size (sq cm): 0 Amount of Product Applied (sq cm): 4 Instrument Used: Forceps,  Scissors Lot #: [id number] Order #: 1 Expiration Date: 06/06/2023 Fenestrated: No Reconstituted: Yes Solution Type: saline Solution Amount: 2 ml Lot #: [id number] Solution Expiration Date: 01/31/2022 Secured: No Dressing Applied: Yes Primary Dressing: adaptic, gauze Procedural Pain: 0 Post Procedural Pain: 2 Response to Treatment: Procedure was tolerated well Level of Consciousness (Post- Awake and Alert procedure): Post Procedure Diagnosis Same as Pre-procedure Electronic Signature(s) Signed: 07/03/2021 3:56:32 PM By: [name] PA-C Signed: 07/03/2021 5:21:41 PM By: Baruch Gouty RN, BSN Entered By: Baruch Gouty on 07/03/2021 10:35:03 -------------------------------------------------------------------------------- HPI Details Patient Name: Date of Service: [name], [name] 07/03/2021 10:15 A M Medical Record Number: [id number] Patient Account Number: [id number] Date of Birth/Sex: Treating RN: 08-Nov-1955 (66 y.o. [name] Provider: [name] Other Clinician: Referring Provider: Treating Provider/Extender: [name] in Treatment: 89 History of Present Illness HPI Description: the remaining wound is over the left medial ankle. Similar wound over the right medial ankle healed largely with use of Apligraf. Most recently we have been using Hydrofera Blue over this wound with considerable improvement. The patient has been extensively worked up in the past for her venous insufficiency and she is not a candidate for antireflux surgery although I have none of the details available currently. 08/24/14; considerable improvement today. About 50% of this wound areas now epithelialized. The base of the wound appears to be healthier granulation.as opposed to last week when she had deteriorated a considerable improvement 08/17/14; unfortunately the wound has regressed somewhat. The areas of epithelialization from the superior aspect are  not nearly as healthy as they were last week. The patient thinks her Hydrofera Blue slipped. 09/07/14; unfortunately the area has markedly regressed in the 2 weeks since I've seen this. There is an odor surrounding erythema. The healthy granulation tissue that we had at the base of the wound now is  a dusky color. The nurse reports green drainage 09/14/14; the area looks somewhat better than last week. There is less erythema and less drainage. The culture I did did not show any growth. Nevertheless I think it is better to continue the Cipro and doxycycline for a further week. The remaining wound area was debridement. 09/21/14. Wound did not require debridement last week. Still less erythema and less drainage. She can complete her antibiotics. The areas of epithelialization in the superior aspect of the wound do not look as healthy as they did some weeks ago 10/05/14 continued improvement in the condition of this wound. There is advancing epithelialization. Less aggressive debridement required 10/19/14 continued improvement in the condition and volume of this wound. Less aggressive debridement to the inferior part of this to remove surface slough and fibrinous eschar 11/02/14 no debridement is required. The surface granulation appears healthy although some of her islands of epithelialization seem to have regressed. No evidence of infection 11/16/14; lites surface debridement done of surface eschar. The wound does not look to be unhealthy. No evidence of infection. Unfortunately the patient has had podiatry issues in the right foot and for some reason has redeveloped small surface ulcerations in the medial right ankle. Her original presentation involved wounds in this area 11/23/14 no debridement. The area on the right ankle has enlarged. The left ankle wound appears stable in terms of the surface although there is periwound inflammation. There has been regression in the amount of new skin 11/30/14 no  debridement. Both wound areas appear healthy. There was no evidence of infection. The the new area on the right medial ankle has enlarged although that both the surfaces appear to be stable. 12/07/14; Debridement of the right medial ankle wound. No no debridement was done on the left. 12/14/14 no major change in and now bilateral medial ankle wounds. Both of these are very painful but the no overt evidence of infection. She has had previous venous ablation 12/21/14; patient states that her right medial ankle wound is considerably more painful last week than usual. Her left is also somewhat painful. She could not tolerate debridement. The right medial ankle wound has fibrinous surface eschar 12/28/14 this is a patient with severe bilateral venous insufficiency ulcers. For a considerable period of time we actually had the one on the right medial ankle healed however this recently opened up again in June. The left medial ankle wound has been a refractory area with some absent flows. We had some success with Hydrofera Blue on this area and it literally closed by 50% however it is recently opened up Foley. Both of these were debridement today of surface eschar. She tolerates this poorly 01/25/15: No change in the status of this. Thick adherent escar. Very poor tolerance of any attempt at debridement. I had healed the right medial malleolus wound for a considerable amount of time and had the left one down to about 50% of the volume although this is totally regressed over the last 48 weeks. Further the right leg has reopened. she is trying to make a appointment with pain and vascular, previous ablations with Dr. Aleda Grana. I do not believe there is an arterial insufficiency issue here 02/01/15 the status of the adherent eschar bilaterally is actually improved. No debridement was done. She did not manage to get vascular studies done 02/08/15 continued debridement of the area was done today. The slough is less  adherent and comes off with less pressure. There is no surrounding infection peripheral pulses are  intact 02/15/15 selective debridement with a disposable curette. Again the slough is less adherent and comes off with less difficulty. No surrounding infection peripheral pulses are intact. 02/22/15 selective debridement of the right medial ankle wound. Slough comes off with less difficulty. No obvious surrounding infection peripheral pulses are intact I did not debridement the one on the left. Both of these are stable to improved 03/01/15 selective debridement of both wound areas using a curette to. Adherent slough cup soft with less difficulty. No obvious surrounding infection. The patient tells me that 2 days ago she noted a rash above the right leg wrap. She did not have this on her lower legs when she change this over she arrives with widespread left greater than right almost folliculitis-looking rash which is extremely pruritic. I don't see anything to culture here. There is no rash on the rest of her body. She feels well systemically. 03/08/15; selective debridement of both wounds using a curette. Base of this does not look unhealthy. She had limegreen drainage coming out of the left leg wound and describes a lot of drainage. The rash on her left leg looks improved to. No cultures were done. 03/22/15; patient was not here last week. Basal wounds does not look healthy and there is no surrounding erythema. No drainage. There is still a rash on the left leg that almost looks vasculitic however it is clearly limited to the top of where the wrap would be. 04/05/15; on the right required a surgical debridement of surface eschar and necrotic subcutaneous tissue. I did not debridement the area on the left. These continue to be large open wounds that are not changing that much. We were successful at one point in healing the area on the right, and at the same time the area on the left was roughly half the size  of current measurements. I think a lot of the deterioration has to do with the prolonged time the patient is on her feet at work 04/19/15 I attempted-like surface debridement bilaterally she does not tolerate this. She tells me that she was in allergic care yesterday with extreme pain over her left lateral malleolus/ankle and was told that she has an "sprain" 05/03/15; large bilateral venous insufficiency wounds over the medial malleolus/medial aspect of her ankles. She complains of copious amounts of drainage and his usual large amounts of pain. There is some increasing erythema around the wound on the right extending into the medial aspect of her foot to. historically she came in with these wounds the right one healed and the left one came down to roughly half its current size however the right one is reopened and the left is expanded. 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 05/10/15 large bilateral venous insufficiency wounds. There is less adherence surface left however the surface culture that I did last week grew pseudomonas therefore bilateral selective debridement score necessary. There is surrounding erythema. The patient describes severe bilateral drainage and a lot of pain in the left ankle. Apparently her podiatrist was were ready to do a cortisone shot 05/17/15; the patient complains of pain and again copious amounts of drainage. 05/24/15; we used Iodo flex last week. Patient notes considerable improvement in wound drainage. Only needed to change this once. 05/31/15; we continued Iodoflex; the base of these large wounds bilaterally is not too bad but there is probably likely a significant bioburden here. I would like to debridement  just doesn't tolerate it. 06/06/14 I would like to continue the Iodoflex although she still hasn't managed to obtain supplies. She has bilateral medial malleoli or large wounds which are mostly superficial.  Both of them are covered circumferentially with some nonviable fibrinous slough although she tolerates debridement very poorly. She apparently has an appointment for an ablation on the right leg by interventional radiology. 06/14/15; the patient arrives with the wounds and static condition. We attempted a debridement although she does not do well with this secondary to pain. I 07/05/15; wounds are not much smaller however there appears to be a cleaner granulating base. The left has tight fibrinous slough greater than the right. Debridement is tolerated poorly due to pain. Iodoflex is done more for these wounds in any of the multitude of different dressings I have tried on the left 1 and then subsequently the right. 07/12/15; no change in the condition of this wound. I am able to do an aggressive debridement on the right but not the left. She simply cannot tolerate it. We have been using Iodoflex which helps somewhat. It is worthwhile remembering that at one point we healed the right medial ankle wound and the left was about 25% of the current circumference. We have suggested returning to vascular surgery for review of possible further ablations for one reason or another she has not been able to do this. 07/26/15 no major change in the condition of either wound on her medial ankle. I did not attempt to debridement of these. She has been aggressively scrubbing these while she is in the shower at home. She has her supply of Iodoflex which seems to have done more for these wounds then anything I have put on recently. 08/09/15 wound area appears larger although not verified by measurements. Using Iodoflex 09/05/2015 -- she was here for avisit today but had significant problems with the wound and I was asked to see her for a physician opinion. I have summarize that this lady has had surgery on her left lower extremity about 10 years ago where the possible veins stripping was done. She has had an opinion from  interventional radiology around November 2016 where no further sclerotherapy was ordered. The patient works 12 hours a day and stands on a concrete floor with work boots and is unable to get the proper compression she requires and cannot elevate her limbs appropriately at any given time. She has recently grown Pseudomonas from her wound culture but has not started her ciprofloxacin which was called in for her. 09/13/15 this continues to be a difficult situation for this patient. At one point I had this wound down to a 1.5 x 1.5" wound on her left leg. This is deteriorated and the right leg has reopened. She now has substantial wounds on her medial calcaneus, malleoli and into her lower leg. One on the left has surface eschar but these are far too painful for me to debridement here. She has a vascular surgery appointment next week to see if anything can be done to help here. I think she has had previous ablations several years ago at [facility]. She has no major edema. She tells me that she did not get product last time [facility] and went for several days without it. She continues to work in work boots 12 hours a day. She cannot get compression/4-layer under her work boots. 09/20/15 no major change. Periwound edema control was not very good. Her point with pain and vascular is next Wednesday  the 25th 09/28/15; the patient is seen vascular surgery and is apparently scheduled for repeat duplex ultrasounds of her bilateral lower legs next week. 10/05/15; the patient was seen by Dr. Doren Custard of vascular surgery. He feels that she should have arterial insufficiency excluded as cause/contributed to her nonhealing stage she is therefore booked for an arteriogram. She has apparently monophasic signals in the dorsalis pedis pulses. She also of course has known severe chronic venous insufficiency with previous procedures as noted previously. I had another long discussion with the patient today about her continuing  to work 12 hour shifts. I've written her out for 2 months area had concerns about this as her work location is currently undergoing significant turmoil and this may lead to her termination. She is aware of this however I agree with her that she simply cannot continue to stand for 12 hours multiple days a week with the substantial wound areas she has. 10/19/15; the Dr. Doren Custard appointment was largely for an arteriogram which was normal. She does not have an arterial issue. He didn't make a comment about her chronic venous insufficiency for which she has had previous ablations. Presumably it was not felt that anything additional could be done. The patient is now out of work as I prescribed 2 weeks ago. Her wounds look somewhat less aggravated presumably because of this. I felt I would give debridement another try today 10/25/15; no major change in this patient's wounds. We are struggling to get her product that she can afford into her own home through her insurance. 11/01/15; no major change in the patient's wounds. I have been using silver alginate as the most affordable product. I spoke to Dr. Marla Roe last week with her requested take her to the OR for surgical debridement and placement of ACEL. Dr. Marla Roe told me that she would be willing to do this however [facility] will not cover this, fortunately the patient has [place] healthcare of some variant 11/08/15; no major change in the patient's wounds. She has been completely nonviable surface that this but is in too much pain with any attempted debridement are clinic. I have arranged for her to see Dr. Marla Roe ham of plastic surgery and this appointment is on Monday. I am hopeful that they will take her to the OR for debridement, possible ACEL ultimately possible skin graft 11/22/15 no major change in the patient's wounds over her bilateral medial calcaneus medial malleolus into the lower legs. Surface on these does not look too bad  however on the left there is surrounding erythema and tenderness. This may be cellulitis or could him sleepy tinea. 11/29/15; no major changes in the patient's wounds over her bilateral medial malleolus. There is no infection here and I don't think any additional antibiotics are necessary. There is now plan to move forward. She sees Dr. Marla Roe in a week's time for preparation for operative debridement and ACEL placement I believe on 7/12. She then has a follow-up appointment with Dr. Marla Roe on 7/21 12/28/15; the patient returns today having been taken to the [facility] by Dr. Marla Roe 12/12/15 she underwent debridement, intraoperative cultures [which were negative]. She had placement of a wound VAC. Parent really ACEL was not available to be placed. The wound VAC foam apparently adhered to the wound since then she's been using silver alginate, Xeroform under Ace wraps. She still says there is a lot of drainage and a lot of pain 01/31/16; this is a patient I see monthly. I had referred her  to Dr. Marla Roe him of plastic surgery for large wounds on her bilateral medial ankles. She has been to the OR twice once in early July and once in early August. She tells me over the last 3 weeks she has been using the wound VAC with ACEL underneath it. On the right we've simply been using silver alginate. Under Kerlix Coban wraps. 02/28/16; this is a patient I'm currently seeing monthly. She is gone on to have a skin graft over her large venous insufficiency ulcer on the left medial ankle. This was done by Dr. Marla Roe him. The patient is a bit perturbed about why she didn't have one on her right medial ankle wound. She has been using silver alginate to this. 03/06/16; I received a phone call from her plastic surgery Dr. Marla Roe. She expressed some concern about the viability of the skin graft she did on the left medial ankle wound. Asked me to place Endoform on this. She told me she is not planning to do a  subsequent skin graft on the right as the left one did not take very well. I had placed Hydrofera Blue on the right 03/13/16; continue to have a reasonably healthy wound on the right medial ankle. Down to 3 mm in terms of size. There is epithelialization here. The area on the left medial ankle is her skin graft site. I suppose the last week this looks somewhat better. She has an open area inferiorly however in the center there appears to be some viable tissue. There is a lot of surface callus and eschar that will eventually need to come off however none of this looked to be infected. Patient states that the is able to keep the dressing on for several days which is an improvement. 03/20/16 no major change in the circumference of either wound however on the left side the patient was at Dr. Eusebio Friendly office and they did a debridement of left wound. 50% of the wound seems to be epithelialized. I been using Endoform on the left Hydrofera Blue in the right 03/27/16; she arrives today with her wound is not looking as healthy as they did last week. The area on the right clearly has an adherent surface to this a very similar surface on the left. Unfortunately for this patient this is all too familiar problem. Clearly the Endoform is not working and will need to change that today that has some potential to help this surface. She does not tolerate debridement in this clinic very well. She is changing the dressing wants 04/03/16; patient arrives with the wounds looking somewhat better especially on the right. Dr. Migdalia Dk change the dressing to silver alginate when she saw her on Monday and also sold her some compression socks. The usefulness of the latter is really not clear and woman with severely draining wounds. 04/10/16; the patient is doing a bit of an experiment wearing the compression stockings that Dr. Migdalia Dk provided her to her left leg and the out of legs based dressings that we provided to the  right. 05/01/16; the patient is continuing to wear compression stockings Dr. Migdalia Dk provided her on the left that are apparently silver impregnated. She has been using Iodoflex to the right leg wound. Still a moderate amount of drainage, when she leaves here the wraps only last for 4 days. She has to change the stocking on the left leg every night 05/15/16; she is now using compression stockings bilaterally provided by Dr. Marla Roe. She is wearing a nonadherent layer over  the wounds so really I don't think there is anything specific being done to this now. She has some reduction on the left wound. The right is stable. I think all healing here is being done without a specific dressing 06/09/16; patient arrives here today with not much change in the wound certainly in diameter to large circular wounds over the medial aspect of her ankle bilaterally. Under the light of these services are certainly not viable for healing. There is no evidence of surrounding infection. She is wearing compression stockings with some sort of silver impregnation as prescribed by Dr. Marla Roe. She has a follow-up with her tomorrow. 06/30/16; no major change in the size or condition of her wounds. These are still probably covered with a nonviable surface. She is using only her purchase stockings. She did see Dr. Marla Roe who seemed to want to apply Dakin's solution to this I'm not extreme short what value this would be. I would suggest Iodoflex which she still has at home. 07/28/16; I follow Mrs. Marlin episodically along with Dr. Marla Roe. 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 She has been applying a topical collagen ointment to both wounds with Adaptic. I don't think Dr. Marla Roe is planning to take her back to the OR. 08/19/16; I follow Mrs. Jeneen Rinks on a monthly basis along with Dr. Marla Roe of plastic surgery. She has very refractory venous  insufficiency wounds on the bilateral medial lower legs left greater than right. I been following her for a number of years. At one point I was able to get the right medial malleolus wound to heal and had the left medial malleolus down to about half its current size however and I had to send her to plastic surgery for an operative debridement. Since then things have been stable to slightly improve the area on the right is slightly better one in the left about the same although there is much less adherent surface than I'm used to with this patient. She is using some form of liquid collagen gel that Dr. Marla Roe provided a Kerlix cover with the patient's own pressure stockings. She tells me that she has extreme pain in both ankles and along the lateral aspect of both feet. She has been unable to work for some period of time. She is telling me she is retiring at the beginning of April. She sees Dr. Doran Durand of orthopedics next week 09/22/16; patient has not seen Dr. Marla Roe since the last time she is here. I'm not really sure what she is using to the wounds other than bits and pieces of think she had left over including most recently Hydrofera Blue. She is using juxtalite stockings. She is having difficulty with her husband's recent illness "stroke". She is having to transport him to various doctors appointments. Dr. Marla Roe left her the option of a repeat debridement with ACEL however she has not been able to get the time to follow-up on this. She continues to have a fair amount of drainage out of these wounds with certainly precludes leaving dressings on all week 10/13/16; patient has not seen Dr. Marla Roe since she was last in our clinic. I'm not really sure what she is doing with the wounds, we did try to get her [facility] and I think she is actually using this most of the time. Because of drainage she states she has to change this every second day although this is an improvement from what  she used  to do. She went to see Dr. Doran Durand who did not think she had a muscular issue with regards to her feet, he referred her to a neurologist and I think the appointment is sometime in June. I changed her back to Iodoflex which she has used in the past but not recently. 11/03/16; the patient has been using Iodoflex although she ran out of this. Still claims that there is a lot of drainage although the wound does not look like this. No surrounding erythema. She has not been back to see Dr. Marla Roe 11/24/16; the patient has been using Iodoflex again but she ran out of it 2 or 3 days ago. There is no major change in the condition of either one of these wounds in fact they are larger and covered in a thick adherent surface slough/nonviable tissue especially on the left. She does not tolerate mechanical debridement in our clinic. Going back to see Dr. Marla Roe of plastic surgery for an operative debridement would seem reasonable. 12/15/16; the patient has not been back to see Dr. Marla Roe. She is been dealing with a series of illnesses and her husband which of monopolized her time. She is been using Sorbact which we largely supplied. She states the drainage is bad enough that it maximum she can go 2-3 days without changing the dressing 01/12/2017 -- the patient has not been back for about 4 weeks and has not seen Dr. Marla Roe not does she have any appointment pending. 01/23/17; patient has not seen Dr. Marla Roe even though I suggested this previously. She is using Santyl that was suggested last week by Dr. Con Memos this Cost her $16 through her insurance which is indeed surprising 02/12/17; continuing Santyl and the patient is changing this daily. A lot of drainage. She has not been back to see plastic surgery she is using an Ace wrap. Our intake nurse suggested wrap around stockings which would make a good reasonable alternative 02/26/17; patient is been using Santyl and changing this daily due to  drainage. She has not been to see plastic surgery she uses in April Ace wrap to control the edema. She did obtain extremitease stockings but stated that the edema in her leg was to big for these 03/20/17; patient is using Santyl and Anasept. Surfaces looked better today the area on the right is actually measuring a little smaller. She has states she has a lot of pain in her feet and ankles and is asking for a consult to pain control which I'll try to help her with through our case manager. 04/10/17; the patient arrives with better-looking wound surfaces and is slightly smaller wound on the left she is using a combination of Santyl and Anasept. She has an appointment or at least as started in the pain control center associated with [facility] 05/14/17; this is a patient who I followed for a prolonged period of time. She has venous insufficiency ulcers on her bilateral medial ankles. At one point I had this down to a much smaller wound on the left however these reopened and we've never been able to get these to heal. She has been using Santyl and Anasept gel although 2 weeks ago she ran out of the Anasept gel. She has a stable appearance of the wound. She is going to the wound care clinic at [facility]. They wanted do a nerve block/spinal block although she tells me she is reluctant to go forward with that. 05/21/17; this is a patient I have followed for many years.  She has venous insufficiency ulcers on her bilateral medial ankles. Chronic pain and deformity in her ankles as well. She is been to see plastic surgery as well as orthopedics. Using PolyMem AG most recently/Kerramax/ABDs and 2 layer compression. She has managed to keep this on and she is coming in for a nurse check to change the dressing on Tuesdays, we see her on Fridays 06/05/17; really quite a good looking surface and the area especially on the right medial has contracted in terms of dimensions. Well granulated  healthy-looking tissue on both sides. Even with an open curet there is nothing that even feels abnormal here. This is as good as I've seen this in quite some time. We have been using PolyMem AG and bringing her in for a nurse check 06/12/17; really quite good surface on both of these wounds. The right medial has contracted a bit left is not. We've been using PolyMem and AG and she is coming in for a nurse visit 06/19/17; we have been using PolyMem AG and bringing her in for a nurse check. Dimensions of her wounds are not better but the surfaces looked better bilaterally. She complained of bleeding last night and the left wound and increasing pain bilaterally. She states her wound pain is more neuropathic than just the wounds. There was some suggestion that this was radicular from her pain management doctor in talking to her it is really difficult to sort this out. 06/26/17; using PolyMem and AG and bringing her in for a nurse check as All of this and reasonably stable condition. Certainly not improved. The dimensions on the lateral part of the right leg look better but not really measuring better. The medial aspect on the left is about the same. 07/03/16; we have been using PolyMen AG and bringing her in for a nurse check to change the dressings as the wounds have drainage which precludes once weekly changing. We are using all secondary absorptive dressings.our intake nurse is brought up the idea of using a wound VAC/snap VAC on the wound to help with the drainage to see if this would result in some contraction. This is not a bad idea. The area on the right medial is actually looking smaller. Both wounds have a reasonable-looking surface. There is no evidence of cellulitis. The edema is well controlled 07/10/17; the patient was denied for a snap VAC by her insurance. The major issue with these wounds continues to be drainage. We are using wicked PolyMem AG and she is coming in for a nurse visit to change  this. The wounds are stable to slightly improved. The surface looks vibrant and the area on the right certainly has shrunk in size but very slowly 07/17/17; the patient still has large wounds on her bilateral medial malleoli. Surface of both of these wounds looks better. The dimensions seem to come and go but no consistent improvement. There is no epithelialization. We do not have options for advanced treatment products due to insurance issues. They did not approve of the wound VAC to help control the drainage. More recently we've been using PolyMem and AG wicked to allow drainage through. We have been bringing her in for a nurse visit to change this. We do not have a lot of options for wound care products and the home again due to insurance issues 07/24/17; the patient's wound actually looks somewhat better today. No drainage measurements are smaller still healthy-looking surface. We used silver collagen under PolyMen started last week. We have been  bringing her in for a dressing change 07/31/17; patient's wound surface continued to look better and I think there is visible change in the dimensions of the wound on the right. Rims of epithelialization. We have been using silver collagen under PolyMen and bringing her in for a dressing change. There appears to be less drainage although she is still in need of the dressing change 08/07/17. Patient's wound surface continues to look better on both sides and the area on the right is definitely smaller. We have been using silver collagen and PolyMen. She feels that the drainage has been it has been better. I asked her about her vascular status. She went to see Dr. Aleda Grana at [facility] and had some form of ablation. I don't have much detail on this. I haven't my notes from 2016 that she was not a candidate for any further ablation but I don't have any more information on this. We had referred her to vein and vascular I don't think she ever went. He does not  have a history of PAD although I don't have any information on this either. We don't even have ABIs in our record 08/14/17; we've been using silver collagen and PolyMen cover. And putting the patient and compression. She we are bringing her in as a nurse visit to change this because ofarge amount of drainage. We didn't the ABIs in clinic today since they had been done in many moons 1.2 bilaterally. She has been to see vein and vascular however this was at [facility] and she had ablation although I really don't have any information on this all seemed biking get a report. She is also been operatively debrided by plastic surgery and had a cell placed probably 8-12 months ago. This didn't have a major effect. We've been making some gains with current dressings 08/19/17-She is here in follow-up evaluation for bilateral medial malleoli ulcers. She continues to tolerate debridement very poorly. We will continue with recently changed topical treatment; if no significant improvement may consider switching to Iodosorb/Iodoflex. 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 She has been using silver collagen and PolyMem. I believe she has been used and tried on Iodoflex before. 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 These have been very refractory over time. We have been using silver collagen and PolyMen. She is coming in once a week for a doctor's and once a week for nurse visits. We are actually making some progress 09/18/17; the patient's wounds are smaller especially on the right medial. She arrives today to upset to consider even washing these off with Anasept which I think is been part of the reason this is been closing. We've been using collagen covered in PolyMen  otherwise. It is noted that she has a small area of folliculitis on the right medial calf that. As we are wrapping her legs I'll give her a short course of doxycycline to make sure this doesn't amount to anything. She is a long list of complaints today including imbalance, shortness of breath on exertion, inversion of her left ankle. With regards to the latter complaints she is been to see orthopedics and they offered her a tendon release surgery I believe but wanted her wounds to be closed first. I have recommended she go see her  primary physician with regards to everything else. 09/25/17; patient's wounds are about the same size. We have made some progress bilaterally although not in recent weeks. She will not allow me T wash these wounds with Anasept even if she is doing her cell. Wheeze we've been using collagen covered in PolyMen. Last week she had a small area of folliculitis this is now opened into a small wound. She completed 5 days of trimethoprim sulfamethoxazole 10/02/17; unfortunately the area on her left medial ankle is worse with a larger wound area towards the Achilles. The patient complains of a lot of pain. She will not allow debridement although visually I don't think there is anything to debridement in any case. We have been using silver collagen and PolyMen for several months now. Initially we are making some progress although I'm not really seeing that today. We will move back to [facility]. His admittedly this is a bit of a repeat however I'm hoping that his situation is different now. The patient tells me she had her leg on the left give out on her yesterday this is process some pain. 10/09/17; the patient is seen twice a week largely because of drainage issues coming out of the chronic medial bimalleolar wounds that are chronic. Last week the dimensions of the one on the left looks a little larger I changed her to [facility]. She comes in today with a history of terrible  pain in the bilateral wound areas. She will not allow debridement. She will not even allow a tissue culture. There is no surrounding erythema no no evidence of cellulitis. We have been putting her Kerlix Coban man. She will not allow more aggressive compression as there was a suggestion to put her in 3 layer wraps. 10/16/17; large wounds on her bilateral medial malleoli. These are chronic. Not much change from last week. The surface looks have healthy but absolutely no epithelialization. A lot of pain little less so of drainage. She will not allow debridement or even washing these off in the vigorous fashion with Anasept. 10/23/17; large wounds on her bilateral malleoli which are chronic. Some improvement in terms of size perhaps on the right since last time I saw these. She states that after we increased the 3 layer compression there was some bleeding, when she came in for a nurse visit she did not want 3 layer compression put back on about our nurse managed to convince her. She has known chronic venous visit issues and I'm hoping to get her to tolerate the 3 layer compression. using Hydrofera Blue 10/30/17; absolutely no change in the condition of either wound although we've had some improvement in dimensions on the right.. Attempted to put her in 3 layer compression she didn't tolerated she is back in 2 layer compression. We've been using Hydrofera Blue We looked over her past records. She had venous reflux studies in November 2016. There was no evidence of deep venous reflux on the right. Superficial vein did not show the greater saphenous vein at think this is been previously ablated the small saphenous vein was within normal limits. The left deep venous system showed no DVT the vessels were positive for deep venous reflux in the posterior tibial veins at the ankle. The greater saphenous vein was surgically absent small saphenous vein was within normal limits. She went to vein and vascular at  [facility]. I believe she had an ablation on the left greater saphenous vein. I'll update her reflux studies perhaps ever reviewed  by vein and vascular. We've made absolutely no progress in these wounds. Will also try to read and TheraSkins through her insurance 11/06/17; W the patient apparently has a 2 week follow-up with vein and vascular I like him to review the whole issue with regards to her previous vascular workup by Dr. Aleda Grana. We've really made no progress on these wounds in many months. She arrives today with less viable looking surface on the left medial ankle wound. This was apparently looking about the same on Tuesday when she was here for nurse visit. 11/13/17; deep tissue culture I did last time of the left lower leg showed multiple organisms without any predominating. In particular no Staphylococcus or group A strep were isolated. We sent her for venous reflux studies. She's had a previous left greater saphenous vein stripping and I think sclerotherapy of the right greater saphenous vein. She didn't really look at the lesser saphenous vein this both wounds are on the medial aspect. She has reflux in the common femoral vein and popliteal vein and an accessory vein on the right and the common femoral vein and popliteal vein on the left. I'm going to have her go to see vein and vascular just the look over things and see if anything else beside aggressive compression is indicated here. We have not been able to make any progress on these wounds in spite of the fact that the surface of the wounds is never look too bad. 11/20/17; no major change in the condition of the wounds. Patient reports a large amount of drainage. She has a lot of complaints of pain although enlisting her today I wonder if some of this at least his neuropathic rather than secondary to her wounds. She has an appointment with vein and vascular on 12/30/17. The refractory nature of these wounds in my mind at least  need vein and vascular to look over the wounds the recent reflux studies we did and her history to see if anything further can be done here. I also note her gait is deteriorated quite a bit. 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 She has a bilateral Trendelenburg gait. I wonder if this is neuropathic or perhaps multilevel radicular. 11/27/17; her wounds actually looks slightly better. Healthy-looking granulation tissue a scant amount of epithelialization. [place] healthcare will not pay for [employer]. They will play for tri layer Oasis and Dermagraft. This is not a diabetic ulcer. We'll try for the tri layer Oasis. She still complains of some drainage. She has a vein and vascular appointment on 12/30/17 12/04/17; the wounds visually look quite good. Healthy-looking granulation with some degree of epithelialization. We are still waiting for response to our request for trial to try layer Oasis. Her appointment with vascular to review venous and arterial issues isn't sold the end of July 7/31. Not allow debridement or even vigorous cleansing of the wound surface. 12/18/17; slightly smaller especially on the right. 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 We've been using Hydrofera Blue. We still are looking into triple layer Oasis through her insurance 01/08/18 on evaluation today patient's wound actually appears to be showing signs of good improvement at this point in time. She has been tolerating the dressing changes without complication. Fortunately there does not appear to be any evidence of infection at this point in time. We have been utilizing silver nitrate which does seem to be of benefit for her which is also good news. Overall I'm very happy with how  things seem to be both regards appearance as well as measurement. Patient did see Dr. Bridgett Larsson for evaluation on 12/30/17. In his assessment he felt that stripping would not likely add much more than chronic  compression to the patient's healing process. His recommendation was to follow-up in three months with Dr. Doren Custard if she hasn't healed in order to consider referral back to you and see vascular where she previously was in a trial and was able to get her wound to heal. I'll be see what she feels she when you staying compression and he reiterated this as well. 01/13/18 on evaluation today patient appears to actually be doing very well in regard to her bilateral medial malleolus ulcers. She seems to have tolerated the chemical cauterization with silver nitrate last week she did have some pain through that evening but fortunately states that I'll be see since it seems to be doing better she is overall pleased with the progress. 01/21/18; really quite a remarkable improvement since I've last seen these wounds. We started using silver nitrate specially on the islands of hyper granulation which for some reason her around the wound circumference. This is really done quite nicely. Primary dressing Hydrofera Blue under 4 layer compression. She seems to be able to hold out without a nurse rewrap. Follow-up in 1 week 01/28/18; we've continued the hydrofera blue but continued with chemical cauterization to the wound area that we started about a month ago for irregular hyper granulation. She is made almost stunning improvement in the overall wound dimensions. I was not really expecting this degree of improvement in these chronic wounds 02/05/18; we continue with Hydrofera Bluebut of also continued the aggressive chemical cauterization with silver nitrate. We made nice progress with the right greater than left wound. 02/12/18. We continued with Hydrofera Blue after aggressive chemical cauterization with silver nitrate. We appear to be making nice progress with both wound areas 02/19/2018; we continue with [facility] after washing the wounds vigorously with Anasept spray and chemical cauterization with silver nitrate.  We are making excellent progress. The area on the right's just about closed 02/26/2018. The area on the left medial ankle had too much necrotic debris today. I used a #5 curette we are able to get most of the soft. I continued with the silver nitrate to the much smaller wound on the right medial ankle she had a new area on her right lower pretibial area which she says was due to a role in her compression 03/05/2018; both wound areas look healthy. Not much change in dimensions from last week. I continue to use silver nitrate and Hydrofera Blue. The patient saw Dr. Doren Custard of vein and vascular. He felt she had venous stasis ulcers. He felt based on her previous arteriogram she should have adequate circulation for healing. Also she has deep venous reflux but really no significant correctable superficial venous reflux at this time. He felt we should continue with conservative management including leg elevation and compression 04/02/2018; since we last saw this woman about a month ago she had a fall apparently suffered a pelvic fracture. I did not look up the x-ray. Nevertheless because of pain she literally was bedbound for 2 weeks and had home health coming out to change the dressing. Somewhat predictably this is resulted in considerable improvement in both wound areas. The right is just about closed on the medial malleolus and the left is about half the size. 04/16/2018; both her wounds continue to go down in size. Using [facility]  Blue. 05/07/18; both her wounds appeared to be improving especially on the right where it is almost closed. We are using Hydrofera Blue 05/14/2018; slightly worse this week with larger wounds. Surface on the left medial not quite as good. We have been using Hydrofera Blue 05/21/18; again the wounds are slightly larger. Left medial malleolus slightly larger with eschar around the circumference. We have been using Hydrofera Blue undergoing a wraps for a prolonged period of time. This  got a lot better when she was more recumbent due to a fall and a back injury. I change the primary dressing the silver alginate today. She did not tolerate a 4 layer compression previously although I may need to bring this up with her next time 05/28/2018; area on the left medial malleolus again is slightly larger with more drainage. Area on the right is roughly unchanged. She has a small area of folliculitis on the right medial just on the lower calf. This does not look ominous. 06/03/2018 left medial malleolus slightly smaller in a better looking surface. We used silver nitrate on this last time with silver alginate. The area on the right appears slightly smaller 1/10; left medial malleolus slightly smaller. Small open area on the right. We used silver nitrate and silver alginate as of 2 weeks ago. We continue with the wound and compression. These got a lot better when she was off her feet 1/17; right medial malleolus wound is smaller. The left may be slightly smaller. Both surfaces look somewhat better. 1/24; both wounds are slightly smaller. Using silver alginate under Unna boots 1/31; both wounds appear smaller in fact the area on the right medial is just about closed. Surface eschar. We have been using silver alginate under Unna boots. The patient is less active now spends let much less time on her feet and I think this is contributed to the general improvement in the wound condition 2/7; both wounds appear smaller. I was hopeful the right medial would be closed however there there is still the same small open area. Slight amount of surface eschar on the left the dimensions are smaller there is eschar but the wound edges appear to be free. We have been using silver alginate under Unna boot's 2/14; both wounds once again measure smaller. Circumferential eschar on the left medial. We have been using silver alginate under Unna boots with gradual improvement 2/21; the area on the right medial  malleolus has healed. The area on the left is smaller. We have been using silver alginate and Unna boots. We can discharge wrapping the right leg she has 20/30 stockings at home she will need to protect the scar tissue in this area 2/28; the area on the right medial malleolus remains closed the patient has a compression stocking. The area on the left is smaller. We have been using silver alginate and Unna boots. 3/6 the area on the right medial ankle remains closed. Good edema control noted she is using her own compression stocking. The area on the left medial ankle is smaller. We have been managing this with silver alginate and Unna boots which we will continue today. 3/13; the area on the right medial ankle remains closed and I'm declaring it healed today. When necessary the left is about the same still a healthy-looking surface but no major change and wound area. No evidence of infection and using silver alginate under unna and generally making considerable improvement 3/27 the area on the right medial ankle remains closed the area on  the left is about the same as last week. Certainly not any worse we have been using silver alginate under an Unna boot 4/3; the area on the right medial ankle remains closed per the patient. We did not look at this wound. The wound on the left medial ankle is about the same surface looks healthy we have been using silver alginate under an Unna boot 4/10; area on the right medial ankle remains closed per the patient. We did not look at this wound. The wound on the left medial ankle is slightly larger. The patient complains that the [facility] caused burning pain all week. She also told us that she was a lot more active this week. Changed her back to silver alginate 4/17; right medial ankle still closed per the patient. Left medial ankle is slightly larger. Using silver alginate. She did not tolerate Hydrofera Blue on this area 4/24; right medial ankle remains  closed we have not look at this. The left medial ankle continues to get larger today by about a centimeter. We have been using silver alginate under Unna boots. She complains about 4 layer compression as an alternative. She has been up on her feet working on her garden 5/8; right medial ankle remains closed we did not look at this. The left medial ankle has increased in size about 100%. We have been using silver alginate under Unna boots. She noted increased pain this week and was not surprised that the wound is deteriorated 5/15; no major change in SA however much less erythema ( one week of doxy ocellulitis). 5/22-66 year old female returns at 1 week to the clinic for left medial ankle wound for which we have been using silver alginate under 3 layer compression She was placed on DOXY at last visit - the wound is wider at this visit. She is in 3 layer compression 5/29; change to [facility] last week. I had given her empiric doxycycline 2 weeks ago for a week. She is in 3 layer compression. She complains of a lot of pain and drainage on presentation today. 6/5; using Hydrofera Blue. I gave her doxycycline recently empirically for erythema and pain around the wound. Believe her cultures showed enterococcus which not would not have been well covered by doxycycline nevertheless the wound looks better and I don't feel specifically that the enterococcus needs to be covered. She has a new what looks like a wrap injury on her lateral left ankle. 6/12; she is using Hydrofera Blue. She has a new area on the left anterior lower tibial area. This was a wrap injury last week. 6/19; the patient is using Hydrofera Blue. She arrived with marked inflammation and erythema around the wound and tenderness. 12/01/18 on evaluation today patient appears to be doing a little bit better based on what I'm hearing from the standpoint of lassos evaluation to this as far as the overall appearance of the wound is concerned.  Then sometime substandard she typically sees Dr. Dellia Nims. Nonetheless overall very pleased with the progress that she's made up to this point. No fevers, chills, nausea, or vomiting noted at this time. 7/10; some improvement in the surface area. Aggressively debrided last week apparently. I went ahead with the debridement today although the patient does not tolerate this very well. We have been using Iodoflex. Still a fair amount of drainage 7/17; slightly smaller. Using Iodoflex. 7/24; no change from last week in terms of surface area. We have been using Iodoflex. Surface looks and continues to look  somewhat better 7/31; surface area slightly smaller better looking surface. We have been using Iodoflex. This is under Unna boot compression 8/7-Patient presents at 1 week with Unna boot and Iodoflex, wound appears better 8/14-Patient presents at 1 week with Iodoflex, we use the Unna boot, wound appears to be stable better.Patient is getting Botox treatment for the inversion of the foot for tendon release, Next week 8/21; we are using Iodoflex. Unna boot. The wound is stable in terms of surface area. Under illumination there is some areas of the wound that appear to be either epithelialized or perhaps this is adherent slough at this point I was not really clear. It did not wipe off and I was reluctant to debride this today. 8/28; we are using Iodoflex in an Unna boot. Seems to be making good improvement. 9/4; using Iodoflex and wound is slightly smaller. 9/18; we are using Iodoflex with topical silver nitrate when she is here. The wound continues to be smaller 10/2; patient missed her appointment last week due to GI issues. She left and Iodoflex based dressing on for 2 weeks. Wound is about the same size about the size of a dime on the left medial lower 10/9 we have been using Iodoflex on the medial left ankle wound. She has a new superficial probable wrap injury on the dorsal left ankle 10/16; we have  been using Hydrofera Blue since last week. This is on the left medial ankle 10/23; we have been using Hydrofera Blue since 2 weeks ago. This is on the left medial ankle. Dimensions are better 11/6; using Hydrofera Blue. I think the wound is smaller but still not closed. Left medial ankle 11/13; we have been using Hydrofera Blue. Wound is certainly no smaller this week. Also the surface not as good. This is the remanent of a very large area on her left medial ankle. 11/20; using Sorbact since last week. Wound was about the same in terms of size although I was disappointed about the surface debris 12/11; 3-week follow-up. Patient was on vacation. Wound is measuring slightly larger we have been using Sorbact. 12/18; wound is about the same size however surface looks better last week after debridement. We have been using Sorbact under compression 1/15 wound is probably twice the size of last time increased in length nonviable surface. We have been using Sorbact. She was running a mild fever and missed her appointment last week 1/22; the wound is come down in size but under illumination still a very adherent debris we have been Hydrofera Blue that I changed her to last week 1/29; dimensions down slightly. We have been using Hydrofera Blue 2/19 dimensions are the same however there is rims of epithelialization under illumination. Therefore more the surface area may be epithelialized 2/26; the patient's wound actually measures smaller. The wound looks healthy. We have been using Hydrofera Blue. I had some thoughts about running Apligraf then I still may do that however this looks so much better this week we will delay that for now 3/5; the wound is small but about the same as last week. We have been using Hydrofera Blue. No debridement is required today. 3/19; the wound is about the size of a dime. Healthy looking wound even under illumination. We have been using Hydrofera Blue. No mechanical  debridement is necessary 3/26; not much change from last week although still looks very healthy. We have been using Hydrofera Blue under Unna boots Patient was offered an ankle fusion by podiatry but not until  the wound heals with a proceed with this. 4/9; the patient comes in today with her original wound on the medial ankle looking satisfactory however she has some uncontrolled swelling in the middle part of her leg with 2 new open areas superiorly just lateral to the tibia. I think this was probably a wrap issue. She said she felt uncomfortable during the week but did not call in. We have been using Hydrofera Blue 4/16; the wound on the medial ankle is about the same. She has innumerable small areas superior to this across her mid tibia. I think this is probably folliculitis. She is also been working in the yard doing a lot of sweating 4/30; the patient issue on the upper areas across her mid tibia of all healed. I think this was excessive yard work if I remember. Her wound on the medial ankle is smaller. Some debris on this we have been using Hydrofera Blue under Unna boots 5/7; mid tibia. She has been using Hydrofera Blue under an Unna wrap. She is apparently going for her ankle surgery on June 3 10/28/19-Patient returns to clinic with the ankle wound, we are using Hydrofera Blue under Unna wrap, surgery is scheduled for her left foot for June 3 so she will be back for nurse visit next week READMISSION 01/17/2020 Mrs. Berghuis is a 66 year old woman we have had in this clinic for a long period of time with severe venous hypertension and refractory wounds on her medial lower legs and ankles bilaterally. 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 When she was off her legs for a prolonged period example when she fell and suffered a compression fracture things would heal up quite  nicely. She is now retired and we managed to heal up the right medial leg wound. The left one was very tiny last time I saw this although still refractory. 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 It got to the point where this was interfering with her walking and she elected to proceed with a ankle arthrodesis to straighten her her ankle and leave her with a functional outcome for mobilization. The patient was referred to Dr. Doren Custard and really this took some time to arrange. Dr. Doren Custard saw her on 12/07/2019. Once again he verified that she had no arterial issues. She had previously had an angiogram several years ago. Follow-up ABIs on the left showed an ABI of 1.12 with triphasic waveforms and a TBI of 0.92. She is felt to have chronic deep venous insufficiency but I do not think it was felt that anything could be done from about this from an ablation point of view. At the time Dr. Doren Custard saw this patient the wounds actually look closed via the pictures in his clinic. The patient finally underwent her surgery on 12/15/2019. This went reasonably well and there was a good anatomic outcome. She developed a small distal wound dehiscence on the lateral part of the surgical wound. However more problematically she is developed recurrence of the wound on the medial left ankle. There are actually 2 wounds here one in the distal lower leg and 1 pretty much at the level of the medial malleolus. It is a more distal area that is more problematic. She has been using Hydrofera Blue which started on Friday  before this she was simply Ace wrapping. There was a culture done that showed Pseudomonas and she is on ciprofloxacin. A recent CNS on 8/11 was negative. The patient reports some pain but I generally think this is improving. She is using a cam boot completely nonweightbearing using a walker for pivot transfers and a  wheelchair 8/24; not much improvement unfortunately she has a surgical wound on the lateral part in the venous insufficiency wound medially. The bottom part of the medial insufficiency wound is still necrotic there is exposed tendon here. We have been using Hydrofera Blue under compression. Her edema control is however better 8/31; patient in for follow-up of his surgical wound on the lateral part of her left leg and chronic venous insufficiency ulcers medially. We put her back in compression last week. She comes in today with a complaint of 3 or 4 days worth of increasing pain. She felt her cam walker was rubbing on the area on the back of her heel. However there is intense erythema seems more likely she has cellulitis. She had 2 cultures done when she was seeing podiatry in the postop. One of them in late July showed Pseudomonas and she received a course of ciprofloxacin the other was negative on 8/11 she is allergic to penicillin with anaphylactoid complaints of hives oral swelling via information in epic 9/9; when I saw this patient last week she had intense anterior erythema around her wound on the right lateral heel and ankle and also into the right medial heel. Some of this was no doubt drainage and her walker boot however I was convinced she had cellulitis. I gave her Levaquin and Bactrim she is finishing up on this now. She is following up with Dr. [name] he saw her yesterday. He is taken her out of the walking boot of course she is still nonweightbearing. Her x-ray was negative for any worrisome features such as soft tissue air etc. Things are a lot better this week. She has home health. We have been using Hydrofera Blue under an [employer] which she put back on yesterday. I did not wrap her last week 9/17; her surrounding skin looks a lot better. In fact the area on the left lateral ankle has just a scant amount of eschar. The only remaining wound is the large area on the left medial ankle.  Probably about 60% of this is healthy granulation at the surface however she has a significant divot distally. This has adherent debris in it. I been using debridement and silver collagen to try and get this area to fill-in although I do not think we have made much progress this week 9/24; the patient's wound on the left medial ankle looks a lot better. The deeper divot area distally still requires debridement but this is cleaning up quite nicely we have been using silver collagen. The patient is complaining of swelling in her foot and is worried that that is contributing to the nonhealing of the ankle wound. She is also complaining of numbness in her anterior toes 10/4; left medial ankle. The small area distally still has a divot with necrotic material that I have been debriding away. This has an undermining area. She is approved for Apligraf. She saw Dr. [name] her surgeon on 10/1. I think he declared himself is satisfied with the condition of things. Still nonweightbearing till the end of the month. 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 Her surgical wound is well closed. There  is no evidence of infection 10/11; the patient arrived in clinic today with the expectation that we be able to put an Apligraf on this area after debridement however she arrives with a relatively large amount of green drainage on the dressing. The patient states that this started on Friday. She has not been systemically unwell. 10/19; culture I did last week showed both Enterococcus and Pseudomonas. I think this came in separate parts because I stopped her ciprofloxacin I gave her and prescribed her linezolid however now looking at the final culture result this is Pseudomonas which is resistant to quinolones. She has not yet picked up the linezolid apparently phone issues. We are also trying to get a topical antibiotic out of [place] in [place] they can be applied by home health. She  is still having green drainage 10/16; the patient has her topical antibiotic from [facility] in [place]. This is a compounded gel with vancomycin and ciprofloxacin and gentamicin. We are applying this on the wound bed with silver alginate over the top with Unna boot wraps. She arrives in clinic today with a lot less ominous looking drainage although she is only use this topical preparation once the second time today. She sees Dr. [name] her surgeon on Friday she has home health changing the dressing 11/2; still using her compounded topical antibiotic under silver alginate. Surface is cleaning up there is less drainage. We had an Apligraf for her today and I elected to apply it. A light coating of her antibiotic 04/25/2020 upon evaluation today patient appears to be doing well with regard to her ankle ulcer. There is a little bit of slough noted on the surface of the wound I am can have to perform sharp debridement to clear this away today. With that being said other than that fact overall I feel like she is making progress and we do see some new epithelial growth. There is also some improvement in the depth of the wound and that distal portion. There is little bit of slough there as well. 12/7; 2-week follow-up. Apligraf #3. Dimensions are smaller. Closing in especially inferiorly. Still some surface debris. Still using the [facility] topical antibiotic but I told her that I don't think this needs to be renewed 12/21; 2-week follow-up. Apligraf #4 dimensions are smaller. Nice improvement 06/05/2020; 2-week follow-up. The patient's wound on the left medial ankle looks really excellent. Nice granulation. Advancing epithelialization no undermining no evidence of infection. We would have to reapply for another Apligraf but with the condition of this wound I did not feel strongly about it. We used Hydrofera Blue under the same degree of compression. She follows up with Dr. [name] her surgeon a week  Friday 06/13/2020 upon evaluation today patient appears to be doing excellent in regard to her wound. She has been tolerating the dressing changes without complication. Fortunately there is no signs of active infection at this time. No fevers, chills, nausea, vomiting, or diarrhea. She was using Hydrofera Blue last week. 06/20/2020 06/20/2020 on evaluation today patient actually appears to be doing excellent in regard to her wound. This is measuring better and looking much better as well. She has been using the collagen that seems to be doing better for her as well even though the [facility] was and is not sticking or feeling as rough on her wound. She did see Dr. [name] on Friday he is very pleased he also stated none of the hardware has shifted. That is great news 1/27; the patient  has a small clean wound all that is remaining. I agree that this is too small to really consider an Apligraf. Under illumination the surface is looking quite good. We have been using collagen although the dimensions are not any better this week 2/2; the patient has a small clean wound on the left medial ankle. Although this left of her substantial original areas. Measurements are smaller. We have been using polymen Ag under an Haematologist. 2/10; small area on the left medial ankle. This looks clean nothing to debride however dimensions are about the same we have been using polymen I think now for 2 weeks 2/17; not much change in surface area. We have been using polymen Ag without any improvement. 3/17; 1 month follow-up. The patient has been using endoform without any improvement in fact I think this looks worse with more depth and more expansion 3/24; no improvement. Perhaps less debris on the surface. We have been using Sorbact for 1 week 4/4; wound measures larger. She has edema in her leg and her foot which she tells as her wrap came down. We have been using Unna boots. Sorbact of the wound. She has been approved for  Apligraf 09/12/2020 upon evaluation today patient appears to be doing well with regard to her wound. We did get the Apligraf reapproved which is great news we have that available for application today. Fortunately there is no signs of infection and overall the patient seems to be doing great. The wound bed is nice and clean. 4/27; patient presents for her second application of Apligraf. She states over the past week she has been on her feet more often due to being outside in her garden. She has noted more swelling to her foot as a result. She denies increased warmth, pain or erythema to the wound site. 10/10/2020 upon evaluation today patient appears to be doing well with regard to her wound which does not appear to be quite as irritated as last week from what I am hearing. With that being said unfortunately she is having issues with some erythema and warmth to touch as well as an increase size. I do believe this likely is infected. 10/17/2020 upon evaluation today patient appears to be doing excellent in regard to her wound this is significantly improved as compared to last week. Fortunately I think that the infection is much better controlled at this point. She did have evidence of both Enterococcus as well as Staphylococcus noted on culture. Enterococcus really would not be helped significantly by the Cipro but the wound is doing so much better I am under the assumption that the Staphylococcus is probably the main organism that is causing the current infection. Nonetheless I think that she is doing excellent as far as that is concerned and I am very pleased in that regard. I would therefore recommend she continue with the Cipro. 10/31/2020 upon evaluation today patient appears to be doing well with regard to her wound. She has been tolerating the dressing changes without complication. Fortunately there is no signs of active infection and overall I am extremely pleased with where things stand today. No  fevers, chills, nausea, vomiting, or diarrhea. With that being said she does have some green drainage coming from the wound and although it looks okay I am a little concerned about the possibility of a continuing infection. Specifically with Pseudomonas. For that reason I will go ahead and send in a prescription for Cipro for her to be continued. 11/14/2020 upon evaluation today  patient appears to be doing very well currently in regard to her wound on her leg. She has been tolerating the dressing changes without complication. Fortunately I feel like the infection is finally under good control here. Unfortunately we do not have the Apligraf for application today although we can definitely order to have it in place for next week. That will be her fifth and final of the current series. Nonetheless I feel like her wound is really doing quite well which is great news. 11/21/2020 upon evaluation today patient appears to be doing well with regard to her wound on the medial ankle. Fortunately I think the infection is under control and I do believe we can go ahead and reapply the Apligraf today. She is in agreement with that plan. There does not appear to be any signs of active infection at this time which is great news. No fevers, chills, nausea, vomiting, or diarrhea. 12/05/2020 upon evaluation today patient's wound bed actually showed signs of good granulation epithelization at this point. There does not appear to be any signs of infection which is great news and overall very pleased with where things stand. Overall the patient seems to be doing fairly well in my opinion with regard to her wound although I do believe she continues to build up a lot of biofilm I think she could benefit from using PuraPly at this point. 12/12/2020 upon evaluation today patient's wound actually appears to be doing decently well today. The Unna boot has not been quite as well-tolerated so that more uncomfortable for her and even  causing some pressure over the plate on the lateral portion of her foot which is 90 where the wound is. There did not appear to be any significant deep tissue injury with that there may be a minimal change in the skin noted I think that we may want to go back to the Coflex 2 layer which is a little bit easier on her skin it seems. 12/19/2020 upon evaluation today patient actually seems to be making great progress with the PuraPly currently. She in fact seems to be much better as far as the overall appearance of the wound bed is concerned I am very happy in this regard. I do not see any signs of of infection which is great news as well. No fevers, chills, nausea, vomiting, or diarrhea. 12/26/2020 upon evaluation today patient appears to actually be doing better in regard to her wound on the left medial ankle region. The surface of the wound is actually doing significantly better which is great news. There does not appear to be any signs of infection which is also great news and in general I am extremely pleased with where we stand today. 01/02/2021 upon evaluation today patient appears to be doing well with regard to her wound. In fact this is showing signs of excellent improvement and very pleased with where things stand. In fact the last 3 appointments have all shown signs of this getting smaller which is excellent news. I have not even had to perform any debridement and today is no exception. Overall I feel like this is dramatically improved compared to previous. T oday is PuraPly application #4. 02/15/9449 upon evaluation today patient appears to be doing excellent in regard to her wound this is continue to show signs of improvement and overall I am extremely pleased with where we stand today. She is actually here for PuraPly application #5. Every time we have applied this she is noted definite improvement on  measurements. 01/23/2021 upon evaluation today patient is actually making good progress in  regard to her wound. This was actually on just a little bit longer this time compared to previous due to the fact that she did have to go out of town. She is actually here for PuraPly application #6. We have definitely been seeing improvements in the overall quality of the tissue on the surface of the wound which is awesome news. In general I think that the patient seems to be continuing to make great progress here. 01/30/2021 upon evaluation today patient's wound is actually doing excellent. There is really not any significant biofilm buildup which is great news and overall I am extremely pleased with where things stand today. There does not appear to be any signs of active infection. No fevers, chills, nausea, vomiting, or diarrhea. 02/06/2021 upon evaluation today patient's wound is actually showing signs of excellent improvement which is great news. We continue to see the benefit of the PuraPly this is doing a great job the wound seems not really irritated whatsoever and is showing signs of good granulation at this point. Overall I am extremely happy with what we are seeing. The patient likewise is happy to hear all of this as well. 02/13/2021 upon evaluation today patient appears to be doing well with regard to her wound. This again is measuring smaller today and I am very pleased in this regard. Fortunately there does not appear to be any signs of active infection at this time which is good news from a systemic standpoint. Locally there is still some significant drainage which she does have is concerned about infection locally. No fevers, chills, nausea, vomiting, or diarrhea. 02/20/2021 upon evaluation today patient actually appears to be doing decently well in regard to her wound. She has been tolerating the dressing changes without complication. I do believe the PuraPly is helping wound bed does appear to be doing better. There is no evidence of active infection locally or systemically at this  point visually although on fluorescence imaging there still appear to be bacterial load and bioburden noted. 02/27/2021 upon evaluation today patient fortunately continues to show signs of improvement with use of the PuraPly currently. Subsequently we did review her culture results and to be honest I think that the Bactrim is probably the best option to have her continue at this point. For that reason I am get a go ahead and send in a refill today for her for an additional 2 weeks. Nonetheless I think that we are making excellent progress here. It was Enterococcus and Staphylococcus that were noted she is allergic to penicillin so there is not much I can do from the Enterococcus standpoint though the staff does seem to be sensitive to the Bactrim. 03/06/2021 upon evaluation today patient appears to be doing well with regard to her wound. This is definitely showing signs of improvement which is great news and overall I am extremely pleased with where things stand today. There does not appear to be any signs of active infection which is great news and overall and I do believe that we are headed in the appropriate direction I think the PuraPly is doing an awesome job for her. 03/20/2021 upon evaluation today patient actually appears to be doing well with regard to her wound. We have been using the PuraPly although last week when I was out of the office they actually just used endoform nonetheless this still seems to be doing great. Fortunately there does not appear to  be any signs of active infection which is great news overall I think the patient is making wonderful progress. 03/27/2021 upon evaluation today patient appears to be doing well with regard to her wound in fact there is a little drainage but otherwise her does not appear to be any major signs of open wounds nor infection at this time. Overall I am extremely pleased with where things stand. No fevers, chills, nausea, vomiting,  or diarrhea. 04/03/2021 upon evaluation today patient presents for reevaluation here in the clinic and overall she seems to be doing quite well. Fortunately there does not appear to be any signs of active infection which is great news and in general I am very pleased with how things appear. There is still a very small opening remaining but in general she is looking much better. Fortunately I do not think there is any need for the PuraPly at this point. 04/10/2021 upon evaluation today patient's wound is actually showing signs of excellent improvement. I am actually very pleased with where it stands today. I think she is very close to complete closure in fact this may be healed today. Either way I think that she may benefit from using her own compression socks for the next week and then will subsequently see how things stand following. 04/17/2021 upon evaluation today patient appears to be doing well with regard to her wound on the ankle region. This is actually showing signs of excellent improvement which is great news. I do not see any signs of active infection at this time which is great and no open wound. With that being said on the lateral portion of her ankle right at the base of her plate she actually has an area of pressure that occurred over the past week when she was not in the wrap or we been padding her. This does seem to be an issue which I think is good to be an ongoing problem I am can actually get in touch with Dr. [name] today to see what we can do in that regard. 04/24/2021 upon evaluation today patient appears to be doing well with regard to her ankle region for the most part although there does appear to be some fluid trapped underneath the area where we thought this was healed last week. Unfortunately I think at this time that we can have to reopen and clean away some of this area and then subsequently depending on how things do over the next week or so we will see about discharge but  right now I do not think that is the right way to go. She voiced understanding. 05/01/2021 upon evaluation today patient's wound still appears to be open unfortunately. I do believe that we do need to see what we can do about trying to get this closed I believe compression wrapping is probably can to be indicated here. Fortunately there is no signs of active infection at this time. 05/08/2021 upon evaluation today patient appears to be doing okay in regard to her ulcer this is very superficial and we will see any major issues here. The biggest problem currently that I find is that we just cannot get this area to completely seal up. She seemed to almost be and then things digressed a little bit. Right now is not too bad but we do need to see if we can make some difference here. I think maybe switching over to collagen may be better. 05/15/2021 upon evaluation today patient actually appears to be doing pretty well  in regard to her wound. This is measuring a little smaller and looking much better. She did see orthopedics today there to me making her a insert for her shoe to try to help straighten out her gait. I think this can be beneficial for her as well. 05/22/2021 upon evaluation today patient appears to be doing well currently in regard to her wound although is not significantly smaller unfortunately this at least does not show any signs of active infection at this time which is great news. No fevers, chills, nausea, vomiting, or diarrhea. I am going to see about switching over to endoform that is what we wanted to use in the past always been on backorder. 06/05/2021 upon evaluation today patient appears to be doing somewhat poorly in regard to her wound. This is really not showing the signs of improvement that I would have like to have seen. Fortunately I do not see any signs of active infection locally nor systemically which is great news. With that being said even though I do not see the sign she is  having some increased discomfort which is not good. She is also having some issues here with the wound looking a little larger today compared to where its been. With that being said overall I believe that we probably need to consider at least that there may be a subacute infection here and that is what is going on. We have tried multiple different medications and dressings which have done well for her in the past without seeing any signs of improvement. She is in agreement with addressing this as such. 06/12/2020 upon evaluation today patient's wound is actually showing some signs of improvement today. In fact I do not even see any need for sharp debridement based on what I see at this point today. Fortunately there is no evidence of active infection currently which is great news. No fevers, chills, nausea, vomiting, or diarrhea. 06/26/2021 upon evaluation today patient appears to be doing well with regard to her wounds. Fortunately there does not appear to be any signs of active infection at this time which is great news. I do believe the Bactrim is doing well for her. Fortunately there does not appear to be any signs of active infection at this time. With the hardware in place and everything else I do believe extending this for 2 additional weeks for 4 weeks total would be advisable. 07/03/2021 upon evaluation today patient appears to be doing well with regard to her wound. She has been tolerating the dressing changes which is great news. Fortunately there does not appear to be any evidence of active infection locally nor systemically at this time. Electronic Signature(s) Signed: 07/03/2021 2:31:46 PM By: [name] PA-C Entered By: [name] on 07/03/2021 14:31:46 -------------------------------------------------------------------------------- Physical Exam Details Patient Name: Date of Service: [name], [name] 07/03/2021 10:15 A M Medical Record Number: [id number] Patient Account  Number: [id number] Date of Birth/Sex: Treating RN: [birth year] (66 y.o. [name] Provider: [name] Other Clinician: Referring Provider: Treating Provider/Extender: [name] in Treatment: 73 Constitutional Well-nourished and well-hydrated in no acute distress. Respiratory normal breathing without difficulty. Psychiatric this patient is able to make decisions and demonstrates good insight into disease process. Alert and Oriented x 3. pleasant and cooperative. Notes Upon inspection patient's wound bed showed signs again of good granulation there does not appear to be any slough buildup I do not think that we need to have  any debridement today. I do believe that the PuraPly would be beneficial and after discussing this with the patient we will go ahead and proceed with the PuraPly today. Subsequently I did use the entire piece of the PuraPly this was secured with Adaptic after applying Skin-Prep. Hopefully we will see some good improvements here in the past we almost got her completely healed with this. Electronic Signature(s) Signed: 07/03/2021 2:32:14 PM By: [name] PA-C Entered By: [name] on 07/03/2021 14:32:14 -------------------------------------------------------------------------------- Physician Orders Details Patient Name: Date of Service: [facility] [name], [name] 07/03/2021 10:15 A M Medical Record Number: [id number] Patient Account Number: [id number] Date of Birth/Sex: Treating RN: [birth year] (66 y.o. 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 Margarita Grizzle Return A Cellular or Product/process development scientist or Tissue Based Product Type: - Puraply AM #1 Bathing/ Shower/ Hygiene May shower with protection but do not get wound dressing(s) wet. Edema Control - Lymphedema / SCD / Other Bilateral Lower Extremities Elevate legs to the level of the heart or above for 30 minutes daily and/or when sitting, a frequency of: - throughout the day when sitting Avoid standing for long periods of time. Patient to wear own compression stockings every day. Exercise regularly Moisturize legs daily. Non Wound Condition pply the following to affected area as directed: - Apply silicone bordered foam to left lateral foot for protection A Wound Treatment Wound #15R - Malleolus Wound Laterality: Left, Medial Cleanser: Wound Cleanser (Generic) 1 x Per Day/15 Days Discharge Instructions: Cleanse the wound with wound cleanser prior to applying a clean dressing using gauze sponges, not tissue or cotton balls. Peri-Wound Care: Sween Lotion (Moisturizing lotion) 1 x Per Day/15 Days Discharge Instructions: Apply moisturizing lotion as directed Prim Dressing: Hydrofera Blue Ready Foam, 2.5 x2.5 in 1 x Per Day/15 Days ary Discharge Instructions: Apply to wound bed as instructed Secondary Dressing: Woven Gauze Sponge, Non-Sterile 4x4 in (Generic) 1 x Per Day/15 Days Discharge Instructions: Apply over primary dressing as directed. Compression Wrap: CoFlex TLC XL 2-layer Compression System 4x7 (in/yd) 1 x Per Day/15 Days Discharge Instructions: Apply CoFlex 2-layer compression as directed. (alt for 4 layer) Electronic Signature(s) Signed: 07/03/2021 3:56:32 PM By: [name] PA-C Signed: 07/03/2021 5:21:41 PM By: Baruch Gouty RN, BSN Entered By: Baruch Gouty on 07/03/2021  10:36:24 -------------------------------------------------------------------------------- Problem List Details Patient Name: Date of Service: [name], [name] 07/03/2021 10:15 A M Medical Record Number: [id number] Patient Account Number: [id number] Date of Birth/Sex: Treating RN: 1956-03-25 (66 y.o. 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 07/03/2021 10:15 A M Medical Record Number: [id number] Patient Account Number: [id number] Date of Birth/Sex: Treating RN: 1955-11-18 (66 y.o. [name] Provider: [name] Other Clinician: Referring Provider: Treating Provider/Extender: [name] in Treatment: 10 Subjective Chief Complaint Information obtained from Patient patient is been followed long-term in this clinic for venous insufficiency ulcers with  inflammation, hypertension and ulceration over the medial ankle bilaterally. 01/17/2020; this is a patient who is here for review of postoperative wounds on the left lateral ankle and recurrence of venous stasis ulceration on the left medial History of Present Illness (HPI) the remaining wound is over the left medial ankle. Similar wound over the right medial ankle healed largely with use of Apligraf. Most recently we have been using Hydrofera Blue over this wound with considerable improvement. The patient has been extensively worked up in the past for her venous insufficiency and she is not a candidate for antireflux surgery although I have none of the details available currently. 08/24/14; considerable improvement today. About 50% of this wound areas now epithelialized. The base of the wound appears to be healthier granulation.as opposed to last week when she had deteriorated a considerable improvement 08/17/14; unfortunately the wound has regressed somewhat. The areas of epithelialization from the superior aspect are not nearly as healthy as they were last week. The patient thinks her Hydrofera Blue slipped. 09/07/14; unfortunately the area has markedly regressed in the 2 weeks since I've seen this. There is an odor surrounding erythema. The healthy granulation tissue that we had at the base of the wound now is a dusky color. The nurse reports green drainage 09/14/14; the area looks somewhat better than last week. There is less erythema and less drainage. The culture I did did not show any growth. Nevertheless I think it is better to continue the Cipro and doxycycline for a further week. The remaining wound area was debridement. 09/21/14. Wound did not require debridement last week. Still less erythema and less drainage. She can complete her antibiotics. The areas of epithelialization in the superior aspect of the wound do not look as healthy as they did some weeks ago 10/05/14 continued improvement in  the condition of this wound. There is advancing epithelialization. Less aggressive debridement required 10/19/14 continued improvement in the condition and volume of this wound. Less aggressive debridement to the inferior part of this to remove surface slough and fibrinous eschar 11/02/14 no debridement is required. The surface granulation appears healthy although some of her islands of epithelialization seem to have regressed. No evidence of infection 11/16/14; lites surface debridement done of surface eschar. The wound does not look to be unhealthy. No evidence of infection. Unfortunately the patient has had podiatry issues in the right foot and for some reason has redeveloped small surface ulcerations in the medial right ankle. Her original presentation involved wounds in this area 11/23/14 no debridement. The area on the right ankle has enlarged. The left ankle wound appears stable in terms of the surface although there is periwound inflammation. There has been regression in the amount of new skin 11/30/14 no debridement. Both wound areas appear healthy. There was no evidence of infection.  The the new area on the right medial ankle has enlarged although that both the surfaces appear to be stable. 12/07/14; Debridement of the right medial ankle wound. No no debridement was done on the left. 12/14/14 no major change in and now bilateral medial ankle wounds. Both of these are very painful but the no overt evidence of infection. She has had previous venous ablation 12/21/14; patient states that her right medial ankle wound is considerably more painful last week than usual. Her left is also somewhat painful. She could not tolerate debridement. The right medial ankle wound has fibrinous surface eschar 12/28/14 this is a patient with severe bilateral venous insufficiency ulcers. For a considerable period of time we actually had the one on the right medial ankle healed however this recently opened up again in  June. The left medial ankle wound has been a refractory area with some absent flows. We had some success with Hydrofera Blue on this area and it literally closed by 50% however it is recently opened up Foley. Both of these were debridement today of surface eschar. She tolerates this poorly 01/25/15: No change in the status of this. Thick adherent escar. Very poor tolerance of any attempt at debridement. I had healed the right medial malleolus wound for a considerable amount of time and had the left one down to about 50% of the volume although this is totally regressed over the last 48 weeks. Further the right leg has reopened. she is trying to make a appointment with pain and vascular, previous ablations with Dr. Aleda Grana. I do not believe there is an arterial insufficiency issue here 02/01/15 the status of the adherent eschar bilaterally is actually improved. No debridement was done. She did not manage to get vascular studies done 02/08/15 continued debridement of the area was done today. The slough is less adherent and comes off with less pressure. There is no surrounding infection peripheral pulses are intact 02/15/15 selective debridement with a disposable curette. Again the slough is less adherent and comes off with less difficulty. No surrounding infection peripheral pulses are intact. 02/22/15 selective debridement of the right medial ankle wound. Slough comes off with less difficulty. No obvious surrounding infection peripheral pulses are intact I did not debridement the one on the left. Both of these are stable to improved 03/01/15 selective debridement of both wound areas using a curette to. Adherent slough cup soft with less difficulty. No obvious surrounding infection. The patient tells me that 2 days ago she noted a rash above the right leg wrap. She did not have this on her lower legs when she change this over she arrives with widespread left greater than right almost folliculitis-looking  rash which is extremely pruritic. I don't see anything to culture here. There is no rash on the rest of her body. She feels well systemically. 03/08/15; selective debridement of both wounds using a curette. Base of this does not look unhealthy. She had limegreen drainage coming out of the left leg wound and describes a lot of drainage. The rash on her left leg looks improved to. No cultures were done. 03/22/15; patient was not here last week. Basal wounds does not look healthy and there is no surrounding erythema. No drainage. There is still a rash on the left leg that almost looks vasculitic however it is clearly limited to the top of where the wrap would be. 04/05/15; on the right required a surgical debridement of surface eschar and necrotic subcutaneous tissue. I did not debridement the  area on the left. These continue to be large open wounds that are not changing that much. We were successful at one point in healing the area on the right, and at the same time the area on the left was roughly half the size of current measurements. I think a lot of the deterioration has to do with the prolonged time the patient is on her feet at work 04/19/15 I attempted-like surface debridement bilaterally she does not tolerate this. She tells me that she was in allergic care yesterday with extreme pain over her left lateral malleolus/ankle and was told that she has an "sprain" 05/03/15; large bilateral venous insufficiency wounds over the medial malleolus/medial aspect of her ankles. She complains of copious amounts of drainage and his usual large amounts of pain. There is some increasing erythema around the wound on the right extending into the medial aspect of her foot to. historically she came in with these wounds the right one healed and the left one came down to roughly half its current size however the right one is reopened and the left is expanded. 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 05/10/15 large bilateral venous insufficiency wounds. There is less adherence surface left however the surface culture that I did last week grew pseudomonas therefore bilateral selective debridement score necessary. There is surrounding erythema. The patient describes severe bilateral drainage and a lot of pain in the left ankle. Apparently her podiatrist was were ready to do a cortisone shot 05/17/15; the patient complains of pain and again copious amounts of drainage. 05/24/15; we used Iodo flex last week. Patient notes considerable improvement in wound drainage. Only needed to change this once. 05/31/15; we continued Iodoflex; the base of these large wounds bilaterally is not too bad but there is probably likely a significant bioburden here. I would like to debridement just doesn't tolerate it. 06/06/14 I would like to continue the Iodoflex although she still hasn't managed to obtain supplies. She has bilateral medial malleoli or large wounds which are mostly superficial. Both of them are covered circumferentially with some nonviable fibrinous slough although she tolerates debridement very poorly. She apparently has an appointment for an ablation on the right leg by interventional radiology. 06/14/15; the patient arrives with the wounds and static condition. We attempted a debridement although she does not do well with this secondary to pain. I 07/05/15; wounds are not much smaller however there appears to be a cleaner granulating base. The left has tight fibrinous slough greater than the right. Debridement is tolerated poorly due to pain. Iodoflex is done more for these wounds in any of the multitude of different dressings I have tried on the left 1 and then subsequently the right. 07/12/15; no change in the condition of this wound. I am able to do an aggressive debridement on the right but not the left. She simply cannot tolerate it. We have been using Iodoflex which  helps somewhat. It is worthwhile remembering that at one point we healed the right medial ankle wound and the left was about 25% of the current circumference. We have suggested returning to vascular surgery for review of possible further ablations for one reason or another she has not been able to do this. 07/26/15 no major change in the condition of either wound on her medial ankle. I did not attempt to debridement of these. She has been aggressively scrubbing these while she is  in the shower at home. She has her supply of Iodoflex which seems to have done more for these wounds then anything I have put on recently. 08/09/15 wound area appears larger although not verified by measurements. Using Iodoflex 09/05/2015 -- she was here for avisit today but had significant problems with the wound and I was asked to see her for a physician opinion. I have summarize that this lady has had surgery on her left lower extremity about 10 years ago where the possible veins stripping was done. She has had an opinion from interventional radiology around November 2016 where no further sclerotherapy was ordered. The patient works 12 hours a day and stands on a concrete floor with work boots and is unable to get the proper compression she requires and cannot elevate her limbs appropriately at any given time. She has recently grown Pseudomonas from her wound culture but has not started her ciprofloxacin which was called in for her. 09/13/15 this continues to be a difficult situation for this patient. At one point I had this wound down to a 1.5 x 1.5" wound on her left leg. This is deteriorated and the right leg has reopened. She now has substantial wounds on her medial calcaneus, malleoli and into her lower leg. One on the left has surface eschar but these are far too painful for me to debridement here. She has a vascular surgery appointment next week to see if anything can be done to help here. I think she has had previous  ablations several years ago at [facility]. She has no major edema. She tells me that she did not get product last time [facility]] and went for several days without it. She continues to work in work boots 12 hours a day. She cannot get compression/4-layer under her work boots. 09/20/15 no major change. Periwound edema control was not very good. Her point with pain and vascular is next Wednesday the 25th 09/28/15; the patient is seen vascular surgery and is apparently scheduled for repeat duplex ultrasounds of her bilateral lower legs next week. 10/05/15; the patient was seen by Dr. Doren Custard of vascular surgery. He feels that she should have arterial insufficiency excluded as cause/contributed to her nonhealing stage she is therefore booked for an arteriogram. She has apparently monophasic signals in the dorsalis pedis pulses. She also of course has known severe chronic venous insufficiency with previous procedures as noted previously. I had another long discussion with the patient today about her continuing to work 12 hour shifts. I've written her out for 2 months area had concerns about this as her work location is currently undergoing significant turmoil and this may lead to her termination. She is aware of this however I agree with her that she simply cannot continue to stand for 12 hours multiple days a week with the substantial wound areas she has. 10/19/15; the Dr. Doren Custard appointment was largely for an arteriogram which was normal. She does not have an arterial issue. He didn't make a comment about her chronic venous insufficiency for which she has had previous ablations. Presumably it was not felt that anything additional could be done. The patient is now out of work as I prescribed 2 weeks ago. Her wounds look somewhat less aggravated presumably because of this. I felt I would give debridement another try today 10/25/15; no major change in this patient's wounds. We are struggling to get her product  that she can afford into her own home through her insurance. 11/01/15; no major  change in the patient's wounds. I have been using silver alginate as the most affordable product. I spoke to Dr. Marla Roe last week with her requested take her to the OR for surgical debridement and placement of ACEL. Dr. Marla Roe told me that she would be willing to do this however [facility] will not cover this, fortunately the patient has [place] healthcare of some variant 11/08/15; no major change in the patient's wounds. She has been completely nonviable surface that this but is in too much pain with any attempted debridement are clinic. I have arranged for her to see Dr. Marla Roe ham of plastic surgery and this appointment is on Monday. I am hopeful that they will take her to the OR for debridement, possible ACEL ultimately possible skin graft 11/22/15 no major change in the patient's wounds over her bilateral medial calcaneus medial malleolus into the lower legs. Surface on these does not look too bad however on the left there is surrounding erythema and tenderness. This may be cellulitis or could him sleepy tinea. 11/29/15; no major changes in the patient's wounds over her bilateral medial malleolus. There is no infection here and I don't think any additional antibiotics are necessary. There is now plan to move forward. She sees Dr. Marla Roe in a week's time for preparation for operative debridement and ACEL placement I believe on 7/12. She then has a follow-up appointment with Dr. Marla Roe on 7/21 12/28/15; the patient returns today having been taken to the [place] by Dr. Marla Roe 12/12/15 she underwent debridement, intraoperative cultures [which were negative]. She had placement of a wound VAC. Parent really ACEL was not available to be placed. The wound VAC foam apparently adhered to the wound since then she's been using silver alginate, Xeroform under Ace wraps. She still says there is a lot of  drainage and a lot of pain 01/31/16; this is a patient I see monthly. I had referred her to Dr. Marla Roe him of plastic surgery for large wounds on her bilateral medial ankles. She has been to the OR twice once in early July and once in early August. She tells me over the last 3 weeks she has been using the wound VAC with ACEL underneath it. On the right we've simply been using silver alginate. Under Kerlix Coban wraps. 02/28/16; this is a patient I'm currently seeing monthly. She is gone on to have a skin graft over her large venous insufficiency ulcer on the left medial ankle. This was done by Dr. Marla Roe him. The patient is a bit perturbed about why she didn't have one on her right medial ankle wound. She has been using silver alginate to this. 03/06/16; I received a phone call from her plastic surgery Dr. Marla Roe. She expressed some concern about the viability of the skin graft she did on the left medial ankle wound. Asked me to place Endoform on this. She told me she is not planning to do a subsequent skin graft on the right as the left one did not take very well. I had placed Hydrofera Blue on the right 03/13/16; continue to have a reasonably healthy wound on the right medial ankle. Down to 3 mm in terms of size. There is epithelialization here. The area on the left medial ankle is her skin graft site. I suppose the last week this looks somewhat better. She has an open area inferiorly however in the center there appears to be some viable tissue. There is a lot of surface callus and eschar that  will eventually need to come off however none of this looked to be infected. Patient states that the is able to keep the dressing on for several days which is an improvement. 03/20/16 no major change in the circumference of either wound however on the left side the patient was at Dr. Eusebio Friendly office and they did a debridement of left wound. 50% of the wound seems to be epithelialized. I been using  Endoform on the left Hydrofera Blue in the right 03/27/16; she arrives today with her wound is not looking as healthy as they did last week. The area on the right clearly has an adherent surface to this a very similar surface on the left. Unfortunately for this patient this is all too familiar problem. Clearly the Endoform is not working and will need to change that today that has some potential to help this surface. She does not tolerate debridement in this clinic very well. She is changing the dressing wants 04/03/16; patient arrives with the wounds looking somewhat better especially on the right. Dr. Migdalia Dk change the dressing to silver alginate when she saw her on Monday and also sold her some compression socks. The usefulness of the latter is really not clear and woman with severely draining wounds. 04/10/16; the patient is doing a bit of an experiment wearing the compression stockings that Dr. Migdalia Dk provided her to her left leg and the out of legs based dressings that we provided to the right. 05/01/16; the patient is continuing to wear compression stockings Dr. Migdalia Dk provided her on the left that are apparently silver impregnated. She has been using Iodoflex to the right leg wound. Still a moderate amount of drainage, when she leaves here the wraps only last for 4 days. She has to change the stocking on the left leg every night 05/15/16; she is now using compression stockings bilaterally provided by Dr. Marla Roe. She is wearing a nonadherent layer over the wounds so really I don't think there is anything specific being done to this now. She has some reduction on the left wound. The right is stable. I think all healing here is being done without a specific dressing 06/09/16; patient arrives here today with not much change in the wound certainly in diameter to large circular wounds over the medial aspect of her ankle bilaterally. Under the light of these services are certainly not viable for  healing. There is no evidence of surrounding infection. She is wearing compression stockings with some sort of silver impregnation as prescribed by Dr. Marla Roe. She has a follow-up with her tomorrow. 06/30/16; no major change in the size or condition of her wounds. These are still probably covered with a nonviable surface. She is using only her purchase stockings. She did see Dr. Marla Roe who seemed to want to apply Dakin's solution to this I'm not extreme short what value this would be. I would suggest Iodoflex which she still has at home. 07/28/16; I follow Mrs. Houchen episodically along with Dr. Marla Roe. 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 She has been applying a topical collagen ointment to both wounds with Adaptic. I don't think Dr. Marla Roe is planning to take her back to the OR. 08/19/16; I follow Mrs. Jeneen Rinks on a monthly basis along with Dr. Marla Roe of plastic surgery. 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 I been following her for a number of years. At one point  I was able to get the right medial malleolus wound to heal and had the left medial malleolus down to about half its current size however and I had to send her to plastic surgery for an operative debridement. Since then things have been stable to slightly improve the area on the right is slightly better one in the left about the same although there is much less adherent surface than I'm used to with this patient. She is using some form of liquid collagen gel that Dr. Marla Roe provided a Kerlix cover with the patient's own pressure stockings. She tells me that she has extreme pain in both ankles and along the lateral aspect of both feet. She has been unable to work for some period of time. She is telling me she is retiring at the beginning of April. She sees Dr. Doran Durand of orthopedics  next week 09/22/16; patient has not seen Dr. Marla Roe since the last time she is here. I'm not really sure what she is using to the wounds other than bits and pieces of think she had left over including most recently Hydrofera Blue. She is using juxtalite stockings. She is having difficulty with her husband's recent illness "stroke". She is having to transport him to various doctors appointments. Dr. Marla Roe left her the option of a repeat debridement with ACEL however she has not been able to get the time to follow-up on this. She continues to have a fair amount of drainage out of these wounds with certainly precludes leaving dressings on all week 10/13/16; patient has not seen Dr. Marla Roe since she was last in our clinic. I'm not really sure what she is doing with the wounds, we did try to get her [facility] and I think she is actually using this most of the time. Because of drainage she states she has to change this every second day although this is an improvement from what she used to do. She went to see Dr. Doran Durand who did not think she had a muscular issue with regards to her feet, he referred her to a neurologist and I think the appointment is sometime in June. I changed her back to Iodoflex which she has used in the past but not recently. 11/03/16; the patient has been using Iodoflex although she ran out of this. Still claims that there is a lot of drainage although the wound does not look like this. No surrounding erythema. She has not been back to see Dr. Marla Roe 11/24/16; the patient has been using Iodoflex again but she ran out of it 2 or 3 days ago. There is no major change in the condition of either one of these wounds in fact they are larger and covered in a thick adherent surface slough/nonviable tissue especially on the left. She does not tolerate mechanical debridement in our clinic. Going back to see Dr. Marla Roe of plastic surgery for an operative debridement would seem  reasonable. 12/15/16; the patient has not been back to see Dr. Marla Roe. She is been dealing with a series of illnesses and her husband which of monopolized her time. She is been using Sorbact which we largely supplied. She states the drainage is bad enough that it maximum she can go 2-3 days without changing the dressing 01/12/2017 -- the patient has not been back for about 4 weeks and has not seen Dr. Marla Roe not does she have any appointment pending. 01/23/17; patient has not seen Dr. Marla Roe even though I suggested this previously. She is using [employer]  that was suggested last week by Dr. Con Memos this Cost her $16 through her insurance which is indeed surprising 02/12/17; continuing Santyl and the patient is changing this daily. A lot of drainage. She has not been back to see plastic surgery she is using an Ace wrap. Our intake nurse suggested wrap around stockings which would make a good reasonable alternative 02/26/17; patient is been using Santyl and changing this daily due to drainage. She has not been to see plastic surgery she uses in April Ace wrap to control the edema. She did obtain extremitease stockings but stated that the edema in her leg was to big for these 03/20/17; patient is using Santyl and Anasept. Surfaces looked better today the area on the right is actually measuring a little smaller. She has states she has a lot of pain in her feet and ankles and is asking for a consult to pain control which I'll try to help her with through our case manager. 04/10/17; the patient arrives with better-looking wound surfaces and is slightly smaller wound on the left she is using a combination of Santyl and Anasept. She has an appointment or at least as started in the pain control center associated with [facility] 05/14/17; this is a patient who I followed for a prolonged period of time. She has venous insufficiency ulcers on her bilateral medial ankles. At one point I had this down to a  much smaller wound on the left however these reopened and we've never been able to get these to heal. She has been using Santyl and Anasept gel although 2 weeks ago she ran out of the Anasept gel. She has a stable appearance of the wound. She is going to the wound care clinic at [facility]. They wanted do a nerve block/spinal block although she tells me she is reluctant to go forward with that. 05/21/17; this is a patient I have followed for many years. She has venous insufficiency ulcers on her bilateral medial ankles. Chronic pain and deformity in her ankles as well. She is been to see plastic surgery as well as orthopedics. Using PolyMem AG most recently/Kerramax/ABDs and 2 layer compression. She has managed to keep this on and she is coming in for a nurse check to change the dressing on Tuesdays, we see her on Fridays 06/05/17; really quite a good looking surface and the area especially on the right medial has contracted in terms of dimensions. Well granulated healthy-looking tissue on both sides. Even with an open curet there is nothing that even feels abnormal here. This is as good as I've seen this in quite some time. We have been using PolyMem AG and bringing her in for a nurse check 06/12/17; really quite good surface on both of these wounds. The right medial has contracted a bit left is not. We've been using PolyMem and AG and she is coming in for a nurse visit 06/19/17; we have been using PolyMem AG and bringing her in for a nurse check. Dimensions of her wounds are not better but the surfaces looked better bilaterally. She complained of bleeding last night and the left wound and increasing pain bilaterally. She states her wound pain is more neuropathic than just the wounds. There was some suggestion that this was radicular from her pain management doctor in talking to her it is really difficult to sort this out. 06/26/17; using PolyMem and AG and bringing her in for a nurse check as All  of this and reasonably stable condition.  Certainly not improved. The dimensions on the lateral part of the right leg look better but not really measuring better. The medial aspect on the left is about the same. 07/03/16; we have been using PolyMen AG and bringing her in for a nurse check to change the dressings as the wounds have drainage which precludes once weekly changing. We are using all secondary absorptive dressings.our intake nurse is brought up the idea of using a wound VAC/snap VAC on the wound to help with the drainage to see if this would result in some contraction. This is not a bad idea. The area on the right medial is actually looking smaller. Both wounds have a reasonable-looking surface. There is no evidence of cellulitis. The edema is well controlled 07/10/17; the patient was denied for a snap VAC by her insurance. The major issue with these wounds continues to be drainage. We are using wicked PolyMem AG and she is coming in for a nurse visit to change this. The wounds are stable to slightly improved. The surface looks vibrant and the area on the right certainly has shrunk in size but very slowly 07/17/17; the patient still has large wounds on her bilateral medial malleoli. Surface of both of these wounds looks better. The dimensions seem to come and go but no consistent improvement. There is no epithelialization. We do not have options for advanced treatment products due to insurance issues. They did not approve of the wound VAC to help control the drainage. More recently we've been using PolyMem and AG wicked to allow drainage through. We have been bringing her in for a nurse visit to change this. We do not have a lot of options for wound care products and the home again due to insurance issues 07/24/17; the patient's wound actually looks somewhat better today. No drainage measurements are smaller still healthy-looking surface. We used silver collagen under PolyMen started last week. We  have been bringing her in for a dressing change 07/31/17; patient's wound surface continued to look better and I think there is visible change in the dimensions of the wound on the right. Rims of epithelialization. We have been using silver collagen under PolyMen and bringing her in for a dressing change. There appears to be less drainage although she is still in need of the dressing change 08/07/17. Patient's wound surface continues to look better on both sides and the area on the right is definitely smaller. We have been using silver collagen and PolyMen. She feels that the drainage has been it has been better. I asked her about her vascular status. She went to see Dr. Aleda Grana at [facility] and had some form of ablation. I don't have much detail on this. I haven't my notes from 2016 that she was not a candidate for any further ablation but I don't have any more information on this. We had referred her to vein and vascular I don't think she ever went. He does not have a history of PAD although I don't have any information on this either. We don't even have ABIs in our record 08/14/17; we've been using silver collagen and PolyMen cover. And putting the patient and compression. She we are bringing her in as a nurse visit to change this because ofarge amount of drainage. We didn't the ABIs in clinic today since they had been done in many moons 1.2 bilaterally. She has been to see vein and vascular however this was at [facility] and she had ablation although I really  don't have any information on this all seemed biking get a report. She is also been operatively debrided by plastic surgery and had a cell placed probably 8-12 months ago. This didn't have a major effect. We've been making some gains with current dressings 08/19/17-She is here in follow-up evaluation for bilateral medial malleoli ulcers. She continues to tolerate debridement very poorly. We will continue with recently changed topical  treatment; if no significant improvement may consider switching to Iodosorb/Iodoflex. 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 She has been using silver collagen and PolyMem. I believe she has been used and tried on Iodoflex before. 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 These have been very refractory over time. We have been using silver collagen and PolyMen. She is coming in once a week for a doctor's and once a week for nurse visits. We are actually making some progress 09/18/17; the patient's wounds are smaller especially on the right medial. She arrives today to upset to consider even washing these off with Anasept which I think is been part of the reason this is been closing. We've been using collagen covered in PolyMen otherwise. It is noted that she has a small area of folliculitis on the right medial calf that. As we are wrapping her legs I'll give her a short course of doxycycline to make sure this doesn't amount to anything. She is a long list of complaints today including imbalance, shortness of breath on exertion, inversion of her left ankle. With regards to the latter complaints she is been to see orthopedics and they offered her a tendon release surgery I believe but wanted her wounds to be closed first. I have recommended she go see her primary physician with regards to everything else. 09/25/17; patient's wounds are about the same size. We have made some progress bilaterally although not in recent weeks. She will not allow me T wash these wounds with Anasept even if she is doing her cell. Wheeze we've been using collagen covered in PolyMen. Last week she had a small area of folliculitis this is now opened into a small wound. She completed 5  days of trimethoprim sulfamethoxazole 10/02/17; unfortunately the area on her left medial ankle is worse with a larger wound area towards the Achilles. The patient complains of a lot of pain. She will not allow debridement although visually I don't think there is anything to debridement in any case. We have been using silver collagen and PolyMen for several months now. Initially we are making some progress although I'm not really seeing that today. We will move back to [facility]. His admittedly this is a bit of a repeat however I'm hoping that his situation is different now. The patient tells me she had her leg on the left give out on her yesterday this is process some pain. 10/09/17; the patient is seen twice a week largely because of drainage issues coming out of the chronic medial bimalleolar wounds that are chronic. Last week the dimensions of the one on the left looks a little larger I changed her to [facility]. She comes in today with a history of terrible pain in the bilateral wound areas. She will not allow debridement. She will not even allow a tissue culture. There is no surrounding erythema  no no evidence of cellulitis. We have been putting her Kerlix Coban man. She will not allow more aggressive compression as there was a suggestion to put her in 3 layer wraps. 10/16/17; large wounds on her bilateral medial malleoli. These are chronic. Not much change from last week. The surface looks have healthy but absolutely no epithelialization. A lot of pain little less so of drainage. She will not allow debridement or even washing these off in the vigorous fashion with Anasept. 10/23/17; large wounds on her bilateral malleoli which are chronic. Some improvement in terms of size perhaps on the right since last time I saw these. She states that after we increased the 3 layer compression there was some bleeding, when she came in for a nurse visit she did not want 3 layer compression put back on  about our nurse managed to convince her. She has known chronic venous visit issues and I'm hoping to get her to tolerate the 3 layer compression. using Hydrofera Blue 10/30/17; absolutely no change in the condition of either wound although we've had some improvement in dimensions on the right.. Attempted to put her in 3 layer compression she didn't tolerated she is back in 2 layer compression. We've been using Hydrofera Blue We looked over her past records. She had venous reflux studies in November 2016. There was no evidence of deep venous reflux on the right. Superficial vein did not show the greater saphenous vein at think this is been previously ablated the small saphenous vein was within normal limits. The left deep venous system showed no DVT the vessels were positive for deep venous reflux in the posterior tibial veins at the ankle. The greater saphenous vein was surgically absent small saphenous vein was within normal limits. She went to vein and vascular at [place] [facility]. I believe she had an ablation on the left greater saphenous vein. I'll update her reflux studies perhaps ever reviewed by vein and vascular. We've made absolutely no progress in these wounds. Will also try to read and TheraSkins through her insurance 11/06/17; W the patient apparently has a 2 week follow-up with vein and vascular I like him to review the whole issue with regards to her previous vascular workup by Dr. Aleda Grana. We've really made no progress on these wounds in many months. She arrives today with less viable looking surface on the left medial ankle wound. This was apparently looking about the same on Tuesday when she was here for nurse visit. 11/13/17; deep tissue culture I did last time of the left lower leg showed multiple organisms without any predominating. In particular no Staphylococcus or group A strep were isolated. We sent her for venous reflux studies. She's had a previous left greater saphenous vein  stripping and I think sclerotherapy of the right greater saphenous vein. She didn't really look at the lesser saphenous vein this both wounds are on the medial aspect. She has reflux in the common femoral vein and popliteal vein and an accessory vein on the right and the common femoral vein and popliteal vein on the left. I'm going to have her go to see vein and vascular just the look over things and see if anything else beside aggressive compression is indicated here. We have not been able to make any progress on these wounds in spite of the fact that the surface of the wounds is never look too bad. 11/20/17; no major change in the condition of the wounds. Patient reports a large amount of drainage. She  has a lot of complaints of pain although enlisting her today I wonder if some of this at least his neuropathic rather than secondary to her wounds. She has an appointment with vein and vascular on 12/30/17. The refractory nature of these wounds in my mind at least need vein and vascular to look over the wounds the recent reflux studies we did and her history to see if anything further can be done here. I also note her gait is deteriorated quite a bit. 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 She has a bilateral Trendelenburg gait. I wonder if this is neuropathic or perhaps multilevel radicular. 11/27/17; her wounds actually looks slightly better. Healthy-looking granulation tissue a scant amount of epithelialization. [place] [employer] will not pay for [employer]. They will play for tri layer Oasis and Dermagraft. This is not a diabetic ulcer. We'll try for the tri layer Oasis. She still complains of some drainage. She has a vein and vascular appointment on 12/30/17 12/04/17; the wounds visually look quite good. Healthy-looking granulation with some degree of epithelialization. We are still waiting for response to our request for trial to try layer Oasis. Her appointment with vascular to review  venous and arterial issues isn't sold the end of July 7/31. Not allow debridement or even vigorous cleansing of the wound surface. 12/18/17; slightly smaller especially on the right. 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 We've been using Hydrofera Blue. We still are looking into triple layer Oasis through her insurance 01/08/18 on evaluation today patient's wound actually appears to be showing signs of good improvement at this point in time. She has been tolerating the dressing changes without complication. Fortunately there does not appear to be any evidence of infection at this point in time. We have been utilizing silver nitrate which does seem to be of benefit for her which is also good news. Overall I'm very happy with how things seem to be both regards appearance as well as measurement. Patient did see Dr. Bridgett Larsson for evaluation on 12/30/17. In his assessment he felt that stripping would not likely add much more than chronic compression to the patient's healing process. His recommendation was to follow-up in three months with Dr. Doren Custard if she hasn't healed in order to consider referral back to you and see vascular where she previously was in a trial and was able to get her wound to heal. I'll be see what she feels she when you staying compression and he reiterated this as well. 01/13/18 on evaluation today patient appears to actually be doing very well in regard to her bilateral medial malleolus ulcers. She seems to have tolerated the chemical cauterization with silver nitrate last week she did have some pain through that evening but fortunately states that I'll be see since it seems to be doing better she is overall pleased with the progress. 01/21/18; really quite a remarkable improvement since I've last seen these wounds. We started using silver nitrate specially on the islands of hyper granulation which for some reason her around the wound circumference. This is  really done quite nicely. Primary dressing Hydrofera Blue under 4 layer compression. She seems to be able to hold out without a nurse rewrap. Follow-up in 1 week 01/28/18; we've continued the hydrofera blue but continued with chemical cauterization to the wound area that we started about a month ago for irregular hyper granulation. She is made almost stunning improvement in the overall wound dimensions. I was not really expecting this  degree of improvement in these chronic wounds 02/05/18; we continue with Hydrofera Bluebut of also continued the aggressive chemical cauterization with silver nitrate. We made nice progress with the right greater than left wound. 02/12/18. We continued with Hydrofera Blue after aggressive chemical cauterization with silver nitrate. We appear to be making nice progress with both wound areas 02/19/2018; we continue with [facility] after washing the wounds vigorously with Anasept spray and chemical cauterization with silver nitrate. We are making excellent progress. The area on the right's just about closed 02/26/2018. The area on the left medial ankle had too much necrotic debris today. I used a #5 curette we are able to get most of the soft. I continued with the silver nitrate to the much smaller wound on the right medial ankle she had a new area on her right lower pretibial area which she says was due to a role in her compression 03/05/2018; both wound areas look healthy. Not much change in dimensions from last week. I continue to use silver nitrate and Hydrofera Blue. The patient saw Dr. Doren Custard of vein and vascular. He felt she had venous stasis ulcers. He felt based on her previous arteriogram she should have adequate circulation for healing. Also she has deep venous reflux but really no significant correctable superficial venous reflux at this time. He felt we should continue with conservative management including leg elevation and compression 04/02/2018; since we last  saw this woman about a month ago she had a fall apparently suffered a pelvic fracture. I did not look up the x-ray. Nevertheless because of pain she literally was bedbound for 2 weeks and had home health coming out to change the dressing. Somewhat predictably this is resulted in considerable improvement in both wound areas. The right is just about closed on the medial malleolus and the left is about half the size. 04/16/2018; both her wounds continue to go down in size. Using Hydrofera Blue. 05/07/18; both her wounds appeared to be improving especially on the right where it is almost closed. We are using Hydrofera Blue 05/14/2018; slightly worse this week with larger wounds. Surface on the left medial not quite as good. We have been using Hydrofera Blue 05/21/18; again the wounds are slightly larger. Left medial malleolus slightly larger with eschar around the circumference. We have been using Hydrofera Blue undergoing a wraps for a prolonged period of time. This got a lot better when she was more recumbent due to a fall and a back injury. I change the primary dressing the silver alginate today. She did not tolerate a 4 layer compression previously although I may need to bring this up with her next time 05/28/2018; area on the left medial malleolus again is slightly larger with more drainage. Area on the right is roughly unchanged. She has a small area of folliculitis on the right medial just on the lower calf. This does not look ominous. 06/03/2018 left medial malleolus slightly smaller in a better looking surface. We used silver nitrate on this last time with silver alginate. The area on the right appears slightly smaller 1/10; left medial malleolus slightly smaller. Small open area on the right. We used silver nitrate and silver alginate as of 2 weeks ago. We continue with the wound and compression. These got a lot better when she was off her feet 1/17; right medial malleolus wound is smaller. The  left may be slightly smaller. Both surfaces look somewhat better. 1/24; both wounds are slightly smaller. Using silver alginate under  Unna boots 1/31; both wounds appear smaller in fact the area on the right medial is just about closed. Surface eschar. We have been using silver alginate under Unna boots. The patient is less active now spends let much less time on her feet and I think this is contributed to the general improvement in the wound condition 2/7; both wounds appear smaller. I was hopeful the right medial would be closed however there there is still the same small open area. Slight amount of surface eschar on the left the dimensions are smaller there is eschar but the wound edges appear to be free. We have been using silver alginate under Unna boot's 2/14; both wounds once again measure smaller. Circumferential eschar on the left medial. We have been using silver alginate under Unna boots with gradual improvement 2/21; the area on the right medial malleolus has healed. The area on the left is smaller. We have been using silver alginate and Unna boots. We can discharge wrapping the right leg she has 20/30 stockings at home she will need to protect the scar tissue in this area 2/28; the area on the right medial malleolus remains closed the patient has a compression stocking. The area on the left is smaller. We have been using silver alginate and Unna boots. 3/6 the area on the right medial ankle remains closed. Good edema control noted she is using her own compression stocking. The area on the left medial ankle is smaller. We have been managing this with silver alginate and Unna boots which we will continue today. 3/13; the area on the right medial ankle remains closed and I'm declaring it healed today. When necessary the left is about the same still a healthy-looking surface but no major change and wound area. No evidence of infection and using silver alginate under unna and generally making  considerable improvement 3/27 the area on the right medial ankle remains closed the area on the left is about the same as last week. Certainly not any worse we have been using silver alginate under an Unna boot 4/3; the area on the right medial ankle remains closed per the patient. We did not look at this wound. The wound on the left medial ankle is about the same surface looks healthy we have been using silver alginate under an Unna boot 4/10; area on the right medial ankle remains closed per the patient. We did not look at this wound. The wound on the left medial ankle is slightly larger. The patient complains that the [facility] caused burning pain all week. She also told us that she was a lot more active this week. Changed her back to silver alginate 4/17; right medial ankle still closed per the patient. Left medial ankle is slightly larger. Using silver alginate. She did not tolerate Hydrofera Blue on this area 4/24; right medial ankle remains closed we have not look at this. The left medial ankle continues to get larger today by about a centimeter. We have been using silver alginate under Unna boots. She complains about 4 layer compression as an alternative. She has been up on her feet working on her garden 5/8; right medial ankle remains closed we did not look at this. The left medial ankle has increased in size about 100%. We have been using silver alginate under Unna boots. She noted increased pain this week and was not surprised that the wound is deteriorated 5/15; no major change in SA however much less erythema ( one week of doxy  ocellulitis). 5/22-66 year old female returns at 1 week to the clinic for left medial ankle wound for which we have been using silver alginate under 3 layer compression She was placed on DOXY at last visit - the wound is wider at this visit. She is in 3 layer compression 5/29; change to [facility] last week. I had given her empiric doxycycline 2 weeks ago  for a week. She is in 3 layer compression. She complains of a lot of pain and drainage on presentation today. 6/5; using Hydrofera Blue. I gave her doxycycline recently empirically for erythema and pain around the wound. Believe her cultures showed enterococcus which not would not have been well covered by doxycycline nevertheless the wound looks better and I don't feel specifically that the enterococcus needs to be covered. She has a new what looks like a wrap injury on her lateral left ankle. 6/12; she is using Hydrofera Blue. She has a new area on the left anterior lower tibial area. This was a wrap injury last week. 6/19; the patient is using Hydrofera Blue. She arrived with marked inflammation and erythema around the wound and tenderness. 12/01/18 on evaluation today patient appears to be doing a little bit better based on what I'm hearing from the standpoint of lassos evaluation to this as far as the overall appearance of the wound is concerned. Then sometime substandard she typically sees Dr. Dellia Nims. Nonetheless overall very pleased with the progress that she's made up to this point. No fevers, chills, nausea, or vomiting noted at this time. 7/10; some improvement in the surface area. Aggressively debrided last week apparently. I went ahead with the debridement today although the patient does not tolerate this very well. We have been using Iodoflex. Still a fair amount of drainage 7/17; slightly smaller. Using Iodoflex. 7/24; no change from last week in terms of surface area. We have been using Iodoflex. Surface looks and continues to look somewhat better 7/31; surface area slightly smaller better looking surface. We have been using Iodoflex. This is under Unna boot compression 8/7-Patient presents at 1 week with Unna boot and Iodoflex, wound appears better 8/14-Patient presents at 1 week with Iodoflex, we use the Unna boot, wound appears to be stable better.Patient is getting Botox treatment  for the inversion of the foot for tendon release, Next week 8/21; we are using Iodoflex. Unna boot. The wound is stable in terms of surface area. Under illumination there is some areas of the wound that appear to be either epithelialized or perhaps this is adherent slough at this point I was not really clear. It did not wipe off and I was reluctant to debride this today. 8/28; we are using Iodoflex in an Unna boot. Seems to be making good improvement. 9/4; using Iodoflex and wound is slightly smaller. 9/18; we are using Iodoflex with topical silver nitrate when she is here. The wound continues to be smaller 10/2; patient missed her appointment last week due to GI issues. She left and Iodoflex based dressing on for 2 weeks. Wound is about the same size about the size of a dime on the left medial lower 10/9 we have been using Iodoflex on the medial left ankle wound. She has a new superficial probable wrap injury on the dorsal left ankle 10/16; we have been using Hydrofera Blue since last week. This is on the left medial ankle 10/23; we have been using Hydrofera Blue since 2 weeks ago. This is on the left medial ankle. Dimensions are better 11/6;  using Hydrofera Blue. I think the wound is smaller but still not closed. Left medial ankle 11/13; we have been using Hydrofera Blue. Wound is certainly no smaller this week. Also the surface not as good. This is the remanent of a very large area on her left medial ankle. 11/20; using Sorbact since last week. Wound was about the same in terms of size although I was disappointed about the surface debris 12/11; 3-week follow-up. Patient was on vacation. Wound is measuring slightly larger we have been using Sorbact. 12/18; wound is about the same size however surface looks better last week after debridement. We have been using Sorbact under compression 1/15 wound is probably twice the size of last time increased in length nonviable surface. We have been using  Sorbact. She was running a mild fever and missed her appointment last week 1/22; the wound is come down in size but under illumination still a very adherent debris we have been Hydrofera Blue that I changed her to last week 1/29; dimensions down slightly. We have been using Hydrofera Blue 2/19 dimensions are the same however there is rims of epithelialization under illumination. Therefore more the surface area may be epithelialized 2/26; the patient's wound actually measures smaller. The wound looks healthy. We have been using Hydrofera Blue. I had some thoughts about running Apligraf then I still may do that however this looks so much better this week we will delay that for now 3/5; the wound is small but about the same as last week. We have been using Hydrofera Blue. No debridement is required today. 3/19; the wound is about the size of a dime. Healthy looking wound even under illumination. We have been using Hydrofera Blue. No mechanical debridement is necessary 3/26; not much change from last week although still looks very healthy. We have been using Hydrofera Blue under Unna boots Patient was offered an ankle fusion by podiatry but not until the wound heals with a proceed with this. 4/9; the patient comes in today with her original wound on the medial ankle looking satisfactory however she has some uncontrolled swelling in the middle part of her leg with 2 new open areas superiorly just lateral to the tibia. I think this was probably a wrap issue. She said she felt uncomfortable during the week but did not call in. We have been using Hydrofera Blue 4/16; the wound on the medial ankle is about the same. She has innumerable small areas superior to this across her mid tibia. I think this is probably folliculitis. She is also been working in the yard doing a lot of sweating 4/30; the patient issue on the upper areas across her mid tibia of all healed. I think this was excessive yard work if I  remember. Her wound on the medial ankle is smaller. Some debris on this we have been using Hydrofera Blue under Unna boots 5/7; mid tibia. She has been using Hydrofera Blue under an Unna wrap. She is apparently going for her ankle surgery on June 3 10/28/19-Patient returns to clinic with the ankle wound, we are using Hydrofera Blue under Unna wrap, surgery is scheduled for her left foot for June 3 so she will be back for nurse visit next week READMISSION 01/17/2020 Mrs. Bene is a 66 year old woman we have had in this clinic for a long period of time with severe venous hypertension and refractory wounds on her medial lower legs and ankles bilaterally. 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 When she was off her legs for a prolonged period example when she fell and suffered a compression fracture things would heal up quite nicely. She is now retired and we managed to heal up the right medial leg wound. The left one was very tiny last time I saw this although still refractory. 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 It got to the point where this was interfering with her walking and she elected to proceed with a ankle arthrodesis to straighten her her ankle and leave her with a functional outcome for mobilization. The patient was referred to Dr. Doren Custard and really this took some time to arrange. Dr. Doren Custard saw her on 12/07/2019. Once again he verified that she had no arterial issues. She had previously had an angiogram several years ago. Follow-up ABIs on the left showed an ABI of 1.12 with triphasic waveforms and a TBI of 0.92. She is felt to have chronic deep venous insufficiency but I do not think it was felt that anything could be done from about this from an ablation point of view. At  the time Dr. Doren Custard saw this patient the wounds actually look closed via the pictures in his clinic. The patient finally underwent her surgery on 12/15/2019. This went reasonably well and there was a good anatomic outcome. She developed a small distal wound dehiscence on the lateral part of the surgical wound. However more problematically she is developed recurrence of the wound on the medial left ankle. There are actually 2 wounds here one in the distal lower leg and 1 pretty much at the level of the medial malleolus. It is a more distal area that is more problematic. She has been using Hydrofera Blue which started on Friday before this she was simply Ace wrapping. There was a culture done that showed Pseudomonas and she is on ciprofloxacin. A recent CNS on 8/11 was negative. The patient reports some pain but I generally think this is improving. She is using a cam boot completely nonweightbearing using a walker for pivot transfers and a wheelchair 8/24; not much improvement unfortunately she has a surgical wound on the lateral part in the venous insufficiency wound medially. The bottom part of the medial insufficiency wound is still necrotic there is exposed tendon here. We have been using Hydrofera Blue under compression. Her edema control is however better 8/31; patient in for follow-up of his surgical wound on the lateral part of her left leg and chronic venous insufficiency ulcers medially. We put her back in compression last week. She comes in today with a complaint of 3 or 4 days worth of increasing pain. She felt her cam walker was rubbing on the area on the back of her heel. However there is intense erythema seems more likely she has cellulitis. She had 2 cultures done when she was seeing podiatry in the postop. One of them in late July showed Pseudomonas and she received a course of ciprofloxacin the other was negative on 8/11 she is allergic to penicillin with anaphylactoid complaints of hives  oral swelling via information in epic 9/9; when I saw this patient last week she had intense anterior erythema around her wound on the right lateral heel and ankle and also into the right medial heel. Some of this was no doubt drainage and  her walker boot however I was convinced she had cellulitis. I gave her Levaquin and Bactrim she is finishing up on this now. She is following up with Dr. [name] he saw her yesterday. He is taken her out of the walking boot of course she is still nonweightbearing. Her x-ray was negative for any worrisome features such as soft tissue air etc. Things are a lot better this week. She has home health. We have been using Hydrofera Blue under an [employer] which she put back on yesterday. I did not wrap her last week 9/17; her surrounding skin looks a lot better. In fact the area on the left lateral ankle has just a scant amount of eschar. The only remaining wound is the large area on the left medial ankle. Probably about 60% of this is healthy granulation at the surface however she has a significant divot distally. This has adherent debris in it. I been using debridement and silver collagen to try and get this area to fill-in although I do not think we have made much progress this week 9/24; the patient's wound on the left medial ankle looks a lot better. The deeper divot area distally still requires debridement but this is cleaning up quite nicely we have been using silver collagen. The patient is complaining of swelling in her foot and is worried that that is contributing to the nonhealing of the ankle wound. She is also complaining of numbness in her anterior toes 10/4; left medial ankle. The small area distally still has a divot with necrotic material that I have been debriding away. This has an undermining area. She is approved for Apligraf. She saw Dr. [name] her surgeon on 10/1. I think he declared himself is satisfied with the condition of things. Still  nonweightbearing till the end of the month. 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 Her surgical wound is well closed. There is no evidence of infection 10/11; the patient arrived in clinic today with the expectation that we be able to put an Apligraf on this area after debridement however she arrives with a relatively large amount of green drainage on the dressing. The patient states that this started on Friday. She has not been systemically unwell. 10/19; culture I did last week showed both Enterococcus and Pseudomonas. I think this came in separate parts because I stopped her ciprofloxacin I gave her and prescribed her linezolid however now looking at the final culture result this is Pseudomonas which is resistant to quinolones. She has not yet picked up the linezolid apparently phone issues. We are also trying to get a topical antibiotic out of [place] in [place] they can be applied by home health. She is still having green drainage 10/16; the patient has her topical antibiotic from [facility] in [place]. This is a compounded gel with vancomycin and ciprofloxacin and gentamicin. We are applying this on the wound bed with silver alginate over the top with Unna boot wraps. She arrives in clinic today with a lot less ominous looking drainage although she is only use this topical preparation once the second time today. She sees Dr. [name] her surgeon on Friday she has home health changing the dressing 11/2; still using her compounded topical antibiotic under silver alginate. Surface is cleaning up there is less drainage. We had an Apligraf for her today and I elected to apply it. A light coating of her antibiotic 04/25/2020 upon evaluation today patient appears to be doing well  with regard to her ankle ulcer. There is a little bit of slough noted on the surface of the wound I am can have to perform sharp debridement to clear this away today. With that  being said other than that fact overall I feel like she is making progress and we do see some new epithelial growth. There is also some improvement in the depth of the wound and that distal portion. There is little bit of slough there as well. 12/7; 2-week follow-up. Apligraf #3. Dimensions are smaller. Closing in especially inferiorly. Still some surface debris. Still using the [facility] topical antibiotic but I told her that I don't think this needs to be renewed 12/21; 2-week follow-up. Apligraf #4 dimensions are smaller. Nice improvement 06/05/2020; 2-week follow-up. The patient's wound on the left medial ankle looks really excellent. Nice granulation. Advancing epithelialization no undermining no evidence of infection. We would have to reapply for another Apligraf but with the condition of this wound I did not feel strongly about it. We used Hydrofera Blue under the same degree of compression. She follows up with Dr. [name] her surgeon a week Friday 06/13/2020 upon evaluation today patient appears to be doing excellent in regard to her wound. She has been tolerating the dressing changes without complication. Fortunately there is no signs of active infection at this time. No fevers, chills, nausea, vomiting, or diarrhea. She was using Hydrofera Blue last week. 06/20/2020 06/20/2020 on evaluation today patient actually appears to be doing excellent in regard to her wound. This is measuring better and looking much better as well. She has been using the collagen that seems to be doing better for her as well even though the [facility] was and is not sticking or feeling as rough on her wound. She did see Dr. [name] on Friday he is very pleased he also stated none of the hardware has shifted. That is great news 1/27; the patient has a small clean wound all that is remaining. I agree that this is too small to really consider an Apligraf. Under illumination the surface is looking quite good. We have been  using collagen although the dimensions are not any better this week 2/2; the patient has a small clean wound on the left medial ankle. Although this left of her substantial original areas. Measurements are smaller. We have been using polymen Ag under an Haematologist. 2/10; small area on the left medial ankle. This looks clean nothing to debride however dimensions are about the same we have been using polymen I think now for 2 weeks 2/17; not much change in surface area. We have been using polymen Ag without any improvement. 3/17; 1 month follow-up. The patient has been using endoform without any improvement in fact I think this looks worse with more depth and more expansion 3/24; no improvement. Perhaps less debris on the surface. We have been using Sorbact for 1 week 4/4; wound measures larger. She has edema in her leg and her foot which she tells as her wrap came down. We have been using Unna boots. Sorbact of the wound. She has been approved for Apligraf 09/12/2020 upon evaluation today patient appears to be doing well with regard to her wound. We did get the Apligraf reapproved which is great news we have that available for application today. Fortunately there is no signs of infection and overall the patient seems to be doing great. The wound bed is nice and clean. 4/27; patient presents for her second application of Apligraf. She  states over the past week she has been on her feet more often due to being outside in her garden. She has noted more swelling to her foot as a result. She denies increased warmth, pain or erythema to the wound site. 10/10/2020 upon evaluation today patient appears to be doing well with regard to her wound which does not appear to be quite as irritated as last week from what I am hearing. With that being said unfortunately she is having issues with some erythema and warmth to touch as well as an increase size. I do believe this likely is infected. 10/17/2020 upon evaluation  today patient appears to be doing excellent in regard to her wound this is significantly improved as compared to last week. Fortunately I think that the infection is much better controlled at this point. She did have evidence of both Enterococcus as well as Staphylococcus noted on culture. Enterococcus really would not be helped significantly by the Cipro but the wound is doing so much better I am under the assumption that the Staphylococcus is probably the main organism that is causing the current infection. Nonetheless I think that she is doing excellent as far as that is concerned and I am very pleased in that regard. I would therefore recommend she continue with the Cipro. 10/31/2020 upon evaluation today patient appears to be doing well with regard to her wound. She has been tolerating the dressing changes without complication. Fortunately there is no signs of active infection and overall I am extremely pleased with where things stand today. No fevers, chills, nausea, vomiting, or diarrhea. With that being said she does have some green drainage coming from the wound and although it looks okay I am a little concerned about the possibility of a continuing infection. Specifically with Pseudomonas. For that reason I will go ahead and send in a prescription for Cipro for her to be continued. 11/14/2020 upon evaluation today patient appears to be doing very well currently in regard to her wound on her leg. She has been tolerating the dressing changes without complication. Fortunately I feel like the infection is finally under good control here. Unfortunately we do not have the Apligraf for application today although we can definitely order to have it in place for next week. That will be her fifth and final of the current series. Nonetheless I feel like her wound is really doing quite well which is great news. 11/21/2020 upon evaluation today patient appears to be doing well with regard to her wound on the  medial ankle. Fortunately I think the infection is under control and I do believe we can go ahead and reapply the Apligraf today. She is in agreement with that plan. There does not appear to be any signs of active infection at this time which is great news. No fevers, chills, nausea, vomiting, or diarrhea. 12/05/2020 upon evaluation today patient's wound bed actually showed signs of good granulation epithelization at this point. There does not appear to be any signs of infection which is great news and overall very pleased with where things stand. Overall the patient seems to be doing fairly well in my opinion with regard to her wound although I do believe she continues to build up a lot of biofilm I think she could benefit from using PuraPly at this point. 12/12/2020 upon evaluation today patient's wound actually appears to be doing decently well today. The Unna boot has not been quite as well-tolerated so that more uncomfortable for her and even  causing some pressure over the plate on the lateral portion of her foot which is 90 where the wound is. There did not appear to be any significant deep tissue injury with that there may be a minimal change in the skin noted I think that we may want to go back to the Coflex 2 layer which is a little bit easier on her skin it seems. 12/19/2020 upon evaluation today patient actually seems to be making great progress with the PuraPly currently. She in fact seems to be much better as far as the overall appearance of the wound bed is concerned I am very happy in this regard. I do not see any signs of of infection which is great news as well. No fevers, chills, nausea, vomiting, or diarrhea. 12/26/2020 upon evaluation today patient appears to actually be doing better in regard to her wound on the left medial ankle region. The surface of the wound is actually doing significantly better which is great news. There does not appear to be any signs of infection which is also  great news and in general I am extremely pleased with where we stand today. 01/02/2021 upon evaluation today patient appears to be doing well with regard to her wound. In fact this is showing signs of excellent improvement and very pleased with where things stand. In fact the last 3 appointments have all shown signs of this getting smaller which is excellent news. I have not even had to perform any debridement and today is no exception. Overall I feel like this is dramatically improved compared to previous. T oday is PuraPly application #4. 1/61/0960 upon evaluation today patient appears to be doing excellent in regard to her wound this is continue to show signs of improvement and overall I am extremely pleased with where we stand today. She is actually here for PuraPly application #5. Every time we have applied this she is noted definite improvement on measurements. 01/23/2021 upon evaluation today patient is actually making good progress in regard to her wound. This was actually on just a little bit longer this time compared to previous due to the fact that she did have to go out of town. She is actually here for PuraPly application #6. We have definitely been seeing improvements in the overall quality of the tissue on the surface of the wound which is awesome news. In general I think that the patient seems to be continuing to make great progress here. 01/30/2021 upon evaluation today patient's wound is actually doing excellent. There is really not any significant biofilm buildup which is great news and overall I am extremely pleased with where things stand today. There does not appear to be any signs of active infection. No fevers, chills, nausea, vomiting, or diarrhea. 02/06/2021 upon evaluation today patient's wound is actually showing signs of excellent improvement which is great news. We continue to see the benefit of the PuraPly this is doing a great job the wound seems not really irritated  whatsoever and is showing signs of good granulation at this point. Overall I am extremely happy with what we are seeing. The patient likewise is happy to hear all of this as well. 02/13/2021 upon evaluation today patient appears to be doing well with regard to her wound. This again is measuring smaller today and I am very pleased in this regard. Fortunately there does not appear to be any signs of active infection at this time which is good news from a systemic standpoint. Locally there is still  some significant drainage which she does have is concerned about infection locally. No fevers, chills, nausea, vomiting, or diarrhea. 02/20/2021 upon evaluation today patient actually appears to be doing decently well in regard to her wound. She has been tolerating the dressing changes without complication. I do believe the PuraPly is helping wound bed does appear to be doing better. There is no evidence of active infection locally or systemically at this point visually although on fluorescence imaging there still appear to be bacterial load and bioburden noted. 02/27/2021 upon evaluation today patient fortunately continues to show signs of improvement with use of the PuraPly currently. Subsequently we did review her culture results and to be honest I think that the Bactrim is probably the best option to have her continue at this point. For that reason I am get a go ahead and send in a refill today for her for an additional 2 weeks. Nonetheless I think that we are making excellent progress here. It was Enterococcus and Staphylococcus that were noted she is allergic to penicillin so there is not much I can do from the Enterococcus standpoint though the staff does seem to be sensitive to the Bactrim. 03/06/2021 upon evaluation today patient appears to be doing well with regard to her wound. This is definitely showing signs of improvement which is great news and overall I am extremely pleased with where things stand  today. There does not appear to be any signs of active infection which is great news and overall and I do believe that we are headed in the appropriate direction I think the PuraPly is doing an awesome job for her. 03/20/2021 upon evaluation today patient actually appears to be doing well with regard to her wound. We have been using the PuraPly although last week when I was out of the office they actually just used endoform nonetheless this still seems to be doing great. Fortunately there does not appear to be any signs of active infection which is great news overall I think the patient is making wonderful progress. 03/27/2021 upon evaluation today patient appears to be doing well with regard to her wound in fact there is a little drainage but otherwise her does not appear to be any major signs of open wounds nor infection at this time. Overall I am extremely pleased with where things stand. No fevers, chills, nausea, vomiting, or diarrhea. 04/03/2021 upon evaluation today patient presents for reevaluation here in the clinic and overall she seems to be doing quite well. Fortunately there does not appear to be any signs of active infection which is great news and in general I am very pleased with how things appear. There is still a very small opening remaining but in general she is looking much better. Fortunately I do not think there is any need for the PuraPly at this point. 04/10/2021 upon evaluation today patient's wound is actually showing signs of excellent improvement. I am actually very pleased with where it stands today. I think she is very close to complete closure in fact this may be healed today. Either way I think that she may benefit from using her own compression socks for the next week and then will subsequently see how things stand following. 04/17/2021 upon evaluation today patient appears to be doing well with regard to her wound on the ankle region. This is actually showing signs of  excellent improvement which is great news. I do not see any signs of active infection at this time which is great and  no open wound. With that being said on the lateral portion of her ankle right at the base of her plate she actually has an area of pressure that occurred over the past week when she was not in the wrap or we been padding her. This does seem to be an issue which I think is good to be an ongoing problem I am can actually get in touch with Dr. [name] today to see what we can do in that regard. 04/24/2021 upon evaluation today patient appears to be doing well with regard to her ankle region for the most part although there does appear to be some fluid trapped underneath the area where we thought this was healed last week. Unfortunately I think at this time that we can have to reopen and clean away some of this area and then subsequently depending on how things do over the next week or so we will see about discharge but right now I do not think that is the right way to go. She voiced understanding. 05/01/2021 upon evaluation today patient's wound still appears to be open unfortunately. I do believe that we do need to see what we can do about trying to get this closed I believe compression wrapping is probably can to be indicated here. Fortunately there is no signs of active infection at this time. 05/08/2021 upon evaluation today patient appears to be doing okay in regard to her ulcer this is very superficial and we will see any major issues here. The biggest problem currently that I find is that we just cannot get this area to completely seal up. She seemed to almost be and then things digressed a little bit. Right now is not too bad but we do need to see if we can make some difference here. I think maybe switching over to collagen may be better. 05/15/2021 upon evaluation today patient actually appears to be doing pretty well in regard to her wound. This is measuring a little smaller and  looking much better. She did see orthopedics today there to me making her a insert for her shoe to try to help straighten out her gait. I think this can be beneficial for her as well. 05/22/2021 upon evaluation today patient appears to be doing well currently in regard to her wound although is not significantly smaller unfortunately this at least does not show any signs of active infection at this time which is great news. No fevers, chills, nausea, vomiting, or diarrhea. I am going to see about switching over to endoform that is what we wanted to use in the past always been on backorder. 06/05/2021 upon evaluation today patient appears to be doing somewhat poorly in regard to her wound. This is really not showing the signs of improvement that I would have like to have seen. Fortunately I do not see any signs of active infection locally nor systemically which is great news. With that being said even though I do not see the sign she is having some increased discomfort which is not good. She is also having some issues here with the wound looking a little larger today compared to where its been. With that being said overall I believe that we probably need to consider at least that there may be a subacute infection here and that is what is going on. We have tried multiple different medications and dressings which have done well for her in the past without seeing any signs of improvement. She is in agreement with  addressing this as such. 06/12/2020 upon evaluation today patient's wound is actually showing some signs of improvement today. In fact I do not even see any need for sharp debridement based on what I see at this point today. Fortunately there is no evidence of active infection currently which is great news. No fevers, chills, nausea, vomiting, or diarrhea. 06/26/2021 upon evaluation today patient appears to be doing well with regard to her wounds. Fortunately there does not appear to be any signs of  active infection at this time which is great news. I do believe the Bactrim is doing well for her. Fortunately there does not appear to be any signs of active infection at this time. With the hardware in place and everything else I do believe extending this for 2 additional weeks for 4 weeks total would be advisable. 07/03/2021 upon evaluation today patient appears to be doing well with regard to her wound. She has been tolerating the dressing changes which is great news. Fortunately there does not appear to be any evidence of active infection locally nor systemically at this time. Objective Constitutional Well-nourished and well-hydrated in no acute distress. Vitals Time Taken: 10:03 AM, Height: 68 in, Temperature: 97.8 F, Pulse: 60 bpm, Respiratory Rate: 18 breaths/min, Blood Pressure: 119/69 mmHg. Respiratory normal breathing without difficulty. Psychiatric this patient is able to make decisions and demonstrates good insight into disease process. Alert and Oriented x 3. pleasant and cooperative. General Notes: Upon inspection patient's wound bed showed signs again of good granulation there does not appear to be any slough buildup I do not think that we need to have any debridement today. I do believe that the PuraPly would be beneficial and after discussing this with the patient we will go ahead and proceed with the PuraPly today. Subsequently I did use the entire piece of the PuraPly this was secured with Adaptic after applying Skin-Prep. Hopefully we will see some good improvements here in the past we almost got her completely healed with this. Integumentary (Hair, Skin) Wound #15R status is Open. Original cause of wound was Gradually Appeared. The date acquired was: 12/30/2019. The wound has been in treatment 76 weeks. The wound is located on the Left,Medial Malleolus. The wound measures 1cm length x 0.7cm width x 0.1cm depth; 0.55cm^2 area and 0.055cm^3 volume. There is Fat Layer  (Subcutaneous Tissue) exposed. There is no tunneling or undermining noted. There is a medium amount of serosanguineous drainage noted. The wound margin is distinct with the outline attached to the wound base. There is large (67-100%) red, pink granulation within the wound bed. There is no necrotic tissue within the wound bed. 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 A skin graft procedure using a bioengineered skin substitute/cellular or tissue based product was performed by [name], PA with the following instrument(s): Forceps and Scissors. Puraply AM was applied and was not secured. 4 sq cm of product was utilized and 0 sq cm was wasted. Post Application, adaptic, gauze was applied. A Time Out was conducted at 10:30, prior to the start of the procedure. The procedure was tolerated well with a pain level of 0 throughout and a pain level of 2 following the procedure. Post  procedure Diagnosis Wound #15R: Same as Pre-Procedure . Pre-procedure diagnosis of Wound #15R is a Venous Leg Ulcer located on the Left,Medial Malleolus . There was a Double Layer Compression Therapy Procedure by Baruch Gouty, RN. Post procedure Diagnosis Wound #15R: Same as Pre-Procedure Plan Follow-up Appointments: Return Appointment in 1 week. Margarita Grizzle Cellular or Tissue Based Products: Cellular or Tissue Based Product Type: - Puraply AM #1 Bathing/ Shower/ Hygiene: May shower with protection but do not get wound dressing(s) wet. Edema Control - Lymphedema / SCD / Other: Elevate legs to the level of the heart or above for 30 minutes daily and/or  when sitting, a frequency of: - throughout the day when sitting Avoid standing for long periods of time. Patient to wear own compression stockings every day. Exercise regularly Moisturize legs daily. Non Wound Condition: Apply the following to affected area as directed: - Apply silicone bordered foam to left lateral foot for protection WOUND #15R: - Malleolus Wound Laterality: Left, Medial Cleanser: Wound Cleanser (Generic) 1 x Per Day/15 Days Discharge Instructions: Cleanse the wound with wound cleanser prior to applying a clean dressing using gauze sponges, not tissue or cotton balls. Peri-Wound Care: Sween Lotion (Moisturizing lotion) 1 x Per Day/15 Days Discharge Instructions: Apply moisturizing lotion as directed Prim Dressing: Hydrofera Blue Ready Foam, 2.5 x2.5 in 1 x Per Day/15 Days ary Discharge Instructions: Apply to wound bed as instructed Secondary Dressing: Woven Gauze Sponge, Non-Sterile 4x4 in (Generic) 1 x Per Day/15 Days Discharge Instructions: Apply over primary dressing as directed. Com pression Wrap: CoFlex TLC XL 2-layer Compression System 4x7 (in/yd) 1 x Per Day/15 Days Discharge Instructions: Apply CoFlex 2-layer compression as directed. (alt for 4 layer) 1. Would recommend currently that we continue with the PuraPly for the next several weeks most likely. We will see how things look next week but overall I really think that we are making good progress here and I am hopeful this will just be things out as it did the previous time. 2. I am also can recommend that we have the patient continue with the compression wrapping. We are using the Coflex 2 layer compression wrap. We will see patient back for reevaluation in 1 week here in the clinic. If anything worsens or changes patient will contact our office for additional recommendations. Electronic Signature(s) Signed: 07/03/2021 2:32:51 PM By: [name] PA-C Entered By: [name] on 07/03/2021  14:32:51 -------------------------------------------------------------------------------- SuperBill Details Patient Name: Date of Service: [facility] [name], [name] 07/03/2021 Medical Record Number: [id number] Patient Account Number: [id number] Date of Birth/Sex: Treating RN: 1955-07-28 (66 y.o. [name] Provider: [name] Other Clinician: Referring Provider: Treating Provider/Extender: [name] in Treatment: 74 Diagnosis Coding ICD-10 Codes Code Description [phone] Chronic venous hypertension (idiopathic) with ulcer and inflammation of left lower extremity L97.828 Non-pressure chronic ulcer of other part of left lower leg with other specified severity L97.328 Non-pressure chronic ulcer of left ankle with other specified severity Facility Procedures CPT4 Code: [id number] Description: H2992 PuraPly AM 3X4 (12sq. cm) enter 12qty Modifier: Quantity: 4 CPT4 Code: [id number] Description: 15271 - SKIN SUB GRAFT TRNK/ARM/LEG ICD-10 Diagnosis Description L97.828 Non-pressure chronic ulcer of other part of left lower leg with other specified s Modifier: everity Quantity: 1 Physician Procedures : CPT4 Code Description Modifier [id number] - WC PHYS SKIN SUB GRAFT TRNK/ARM/LEG ICD-10 Diagnosis Description L97.828 Non-pressure chronic ulcer of other part of left lower leg with other specified severity Quantity: 1 Electronic  Signature(s) Signed: 07/03/2021 2:36:50 PM By: [name] PA-C Entered By: [name] on 07/03/2021 14:36:50

## 2021-07-03 NOTE — Progress Notes (Signed)
THANYA, CEGIELSKI ([id number]) Visit Report for 07/03/2021 Arrival Information Details Patient Name: Date of Service: [facility], [name] 07/03/2021 10:15 A M Medical Record Number: [id number] Patient Account Number: [id number] Date of Birth/Sex: Treating RN: [birth year] (66 y.o. [name] Karanvir Balderston: [name] Other Clinician: Referring Eban Weick: Treating Kyrstan Gotwalt/Extender: [name] in Treatment: 18 Visit Information History Since Last Visit Added or deleted any medications: No Patient Arrived: Cane Any new allergies or adverse reactions: No Arrival Time: 10:00 Had a fall or experienced change in No Accompanied By: self activities of daily living that may affect Transfer Assistance: None risk of falls: Patient Identification Verified: Yes Signs or symptoms of abuse/neglect since last visito No Secondary Verification Process Completed: Yes Hospitalized since last visit: No Patient Requires Transmission-Based Precautions: No Implantable device outside of the clinic excluding No Patient Has Alerts: Yes cellular tissue based products placed in the center Patient Alerts: L ABI =1.12, TBI = .92 since last visit: R ABI= 1.02, TBI= .58 Has Dressing in Place as Prescribed: Yes Has Compression in Place as Prescribed: Yes Pain Present Now: Yes Electronic Signature(s) Signed: 07/03/2021 5:21:41 PM By: Baruch Gouty RN, BSN Entered By: Baruch Gouty on 07/03/2021 10:00:46 -------------------------------------------------------------------------------- Compression Therapy Details Patient Name: Date of Service: [name], [name] 07/03/2021 10:15 A M Medical Record Number: [id number] Patient Account Number: [id number] Date of Birth/Sex: Treating RN: [birth year] (66 y.o. [name] Chanoch Mccleery: [name] Other Clinician: Referring Pierre Cumpton: Treating Merilyn Pagan/Extender: [name] in Treatment:  41 Compression Therapy Performed for Wound Assessment: Wound #15R Left,Medial Malleolus Performed By: Clinician Baruch Gouty, RN Compression Type: Double Layer Post Procedure Diagnosis Same as Pre-procedure Electronic Signature(s) Signed: 07/03/2021 5:21:41 PM By: Baruch Gouty RN, BSN Entered By: Baruch Gouty on 07/03/2021 10:16:03 -------------------------------------------------------------------------------- Encounter Discharge Information Details Patient Name: Date of Service: [name], [name] 07/03/2021 10:15 A M Medical Record Number: [id number] Patient Account Number: [id number] Date of Birth/Sex: Treating RN: 1955-09-10 (66 y.o. [name] Maryiah Olvey: [name] Other Clinician: Referring Mithra Spano: Treating Lakoda Mcanany/Extender: [name] in Treatment: 32 Encounter Discharge Information Items Post Procedure Vitals Discharge Condition: Stable Temperature (F): 97.8 Ambulatory Status: Cane Pulse (bpm): 60 Discharge Destination: Home Respiratory Rate (breaths/min): 18 Transportation: Private Auto Blood Pressure (mmHg): 119/69 Accompanied By: self Schedule Follow-up Appointment: Yes Clinical Summary of Care: Patient Declined Electronic Signature(s) Signed: 07/03/2021 5:21:41 PM By: Baruch Gouty RN, BSN Entered By: Baruch Gouty on 07/03/2021 10:50:26 -------------------------------------------------------------------------------- Lower Extremity Assessment Details Patient Name: Date of Service: [facility] [name], [name] 07/03/2021 10:15 A M Medical Record Number: [id number] Patient Account Number: [id number] Date of Birth/Sex: Treating RN: 1956/02/09 (66 y.o. [name] Niklas Chretien: [name] Other Clinician: Referring Hanne Kegg: Treating Raylynne Cubbage/Extender: [name] in Treatment: 70 Edema Assessment Assessed: [Left: No] [Right: No] Edema: [Left: N] [Right:  o] Calf Left: Right: Point of Measurement: 31 cm From Medial Instep 26 cm Ankle Left: Right: Point of Measurement: 11 cm From Medial Instep 20 cm Vascular Assessment Pulses: Dorsalis Pedis Palpable: [Left:Yes] Electronic Signature(s) Signed: 07/03/2021 5:21:41 PM By: Baruch Gouty RN, BSN Entered By: Baruch Gouty on 07/03/2021 10:07:52 -------------------------------------------------------------------------------- [place] Details Patient Name: Date of Service: [name], [name] 07/03/2021 10:15 A M Medical Record Number: [id number] Patient Account Number: [id number] Date of Birth/Sex: Treating RN: 02/14/1956 (66 y.o. F)  Baruch Gouty Primary Care Dehlia Kilner: [name] Other Clinician: Referring Sharel Behne: Treating Adella Manolis/Extender: [name] in Treatment: 103 Multidisciplinary Care Plan reviewed with physician Active Inactive Venous Leg Ulcer Nursing Diagnoses: Actual venous Insuffiency (use after diagnosis is confirmed) Knowledge deficit related to disease process and management Goals: Patient will maintain optimal edema control Date Initiated: 06/20/2020 Target Resolution Date: 07/10/2021 Goal Status: Active Interventions: Assess peripheral edema status every visit. Compression as ordered Treatment Activities: Therapeutic compression applied : 06/20/2020 Notes: 01/30/21: Edema control ongoing, using CoFlex wrap. 06/05/21: Edema control continues Wound/Skin Impairment Nursing Diagnoses: Impaired tissue integrity Knowledge deficit related to ulceration/compromised skin integrity Goals: Patient/caregiver will verbalize understanding of skin care regimen Date Initiated: 01/17/2020 Target Resolution Date: 07/10/2021 Goal Status: Active Ulcer/skin breakdown will have a volume reduction of 30% by week 4 Date Initiated: 01/17/2020 Date Inactivated: 03/12/2020 Target Resolution Date: 03/09/2020 Goal Status:  Met Interventions: Assess patient/caregiver ability to obtain necessary supplies Assess patient/caregiver ability to perform ulcer/skin care regimen upon admission and as needed Assess ulceration(s) every visit Provide education on ulcer and skin care Notes: 01/30/21: Wound care regimen continues, PuraPly skin sub being used. 04/17/21: Wound healed 06/05/21: Wound re-opened, wound care regimen continues. Electronic Signature(s) Signed: 07/03/2021 5:21:41 PM By: Baruch Gouty RN, BSN Entered By: Baruch Gouty on 07/03/2021 10:12:59 -------------------------------------------------------------------------------- Pain Assessment Details Patient Name: Date of Service: [facility] [name], [name] 07/03/2021 10:15 A M Medical Record Number: [id number] Patient Account Number: [id number] Date of Birth/Sex: Treating RN: Apr 01, 1956 (66 y.o. [name] Isatou Agredano: [name] Other Clinician: Referring Lynley Killilea: Treating Rakisha Pincock/Extender: [name] in Treatment: 71 Active Problems Location of Pain Severity and Description of Pain Patient Has Paino Yes Site Locations Pain Location: Pain in Ulcers With Dressing Change: Yes Duration of the Pain. Constant / Intermittento Constant Rate the pain. Current Pain Level: 2 Worst Pain Level: 4 Least Pain Level: 1 Character of Pain Describe the Pain: Aching Pain Management and Medication Current Pain Management: Medication: Yes Is the Current Pain Management Adequate: Adequate How does your wound impact your activities of daily livingo Sleep: No Bathing: No Appetite: No Relationship With Others: No Bladder Continence: No Emotions: No Bowel Continence: No Work: No Toileting: No Drive: No Dressing: No Hobbies: No Electronic Signature(s) Signed: 07/03/2021 5:21:41 PM By: Baruch Gouty RN, BSN Entered By: Baruch Gouty on 07/03/2021  10:01:58 -------------------------------------------------------------------------------- Patient/Caregiver Education Details Patient Name: Date of Service: [name], [name] 2/1/2023andnbsp10:15 A M Medical Record Number: [id number] Patient Account Number: [id number] Date of Birth/Gender: Treating RN: 06/25/1955 (66 y.o. [name] Physician: [name] Other Clinician: Referring Physician: Treating Physician/Extender: [name] in Treatment: 36 Education Assessment Education Provided To: Patient Education Topics Provided Venous: Methods: Explain/Verbal Responses: Reinforcements needed, State content correctly Wound/Skin Impairment: Methods: Explain/Verbal Responses: Reinforcements needed, State content correctly Electronic Signature(s) Signed: 07/03/2021 5:21:41 PM By: Baruch Gouty RN, BSN Entered By: Baruch Gouty on 07/03/2021 10:13:33 -------------------------------------------------------------------------------- Wound Assessment Details Patient Name: Date of Service: [name], [name] 07/03/2021 10:15 A M Medical Record Number: [id number] Patient Account Number: [id number] Date of Birth/Sex: Treating RN: [birth year] (66 y.o. [name] Selma Rodelo: [name] Other Clinician: Referring Letta Cargile: Treating Linzy Darling/Extender: [name] in Treatment: 63 Wound Status Wound Number: 15R Primary Venous Leg Ulcer Etiology: Wound Location: Left, Medial Malleolus Wound Status: Open Wounding Event: Gradually Appeared Comorbid Peripheral Venous Disease,  Osteoarthritis, Confinement Date Acquired: 12/30/2019 History: Anxiety Weeks Of Treatment: 76 Clustered Wound: No Photos Wound Measurements Length: (cm) 1 Width: (cm) 0.7 Depth: (cm) 0.1 Area: (cm) 0.55 Volume: (cm) 0.055 % Reduction in Area: 95.3% % Reduction in Volume: 99.4% Epithelialization: Small  (1-33%) Tunneling: No Undermining: No Wound Description Classification: Full Thickness With Exposed Support Structures Wound Margin: Distinct, outline attached Exudate Amount: Medium Exudate Type: Serosanguineous Exudate Color: red, brown Foul Odor After Cleansing: No Slough/Fibrino No Wound Bed Granulation Amount: Large (67-100%) Exposed Structure Granulation Quality: Red, Pink Fascia Exposed: No Necrotic Amount: None Present (0%) Fat Layer (Subcutaneous Tissue) Exposed: Yes Tendon Exposed: No Muscle Exposed: No Joint Exposed: No Bone Exposed: No Treatment Notes Wound #15R (Malleolus) Wound Laterality: Left, Medial Cleanser Wound Cleanser Discharge Instruction: Cleanse the wound with wound cleanser prior to applying a clean dressing using gauze sponges, not tissue or cotton balls. Peri-Wound Care Sween Lotion (Moisturizing lotion) Discharge Instruction: Apply moisturizing lotion as directed Topical Primary Dressing Hydrofera Blue Ready Foam, 2.5 x2.5 in Discharge Instruction: Apply to wound bed as instructed Secondary Dressing Woven Gauze Sponge, Non-Sterile 4x4 in Discharge Instruction: Apply over primary dressing as directed. Secured With Compression Wrap CoFlex TLC XL 2-layer Compression System 4x7 (in/yd) Discharge Instruction: Apply CoFlex 2-layer compression as directed. (alt for 4 layer) Compression Stockings Add-Ons Electronic Signature(s) Signed: 07/03/2021 5:21:41 PM By: Baruch Gouty RN, BSN Entered By: Baruch Gouty on 07/03/2021 10:11:01 -------------------------------------------------------------------------------- Vitals Details Patient Name: Date of Service: [name], [name] 07/03/2021 10:15 A M Medical Record Number: [id number] Patient Account Number: [id number] Date of Birth/Sex: Treating RN: 1956-05-20 (66 y.o. [name] Ladavia Lindenbaum: [name] Other Clinician: Referring Akin Yi: Treating Maebell Lyvers/Extender: [name] in Treatment: 63 Vital Signs Time Taken: 10:03 Temperature (F): 97.8 Height (in): 68 Pulse (bpm): 60 Respiratory Rate (breaths/min): 18 Blood Pressure (mmHg): 119/69 Reference Range: 80 - 120 mg / dl Electronic Signature(s) Signed: 07/03/2021 5:21:41 PM By: Baruch Gouty RN, BSN Entered By: Baruch Gouty on 07/03/2021 10:04:23

## 2021-07-10 ENCOUNTER — Other Ambulatory Visit: Payer: Self-pay

## 2021-07-10 ENCOUNTER — Encounter (HOSPITAL_BASED_OUTPATIENT_CLINIC_OR_DEPARTMENT_OTHER): Payer: Medicare Other | Admitting: Physician Assistant

## 2021-07-10 DIAGNOSIS — I87332 Chronic venous hypertension (idiopathic) with ulcer and inflammation of left lower extremity: Secondary | ICD-10-CM | POA: Diagnosis not present

## 2021-07-10 NOTE — Progress Notes (Signed)
[name], [name] ([id number]) Visit Report for 07/10/2021 Arrival Information Details Patient Name: Date of Service: [facility], [name] 07/10/2021 8:30 A M Medical Record Number: [id number] Patient Account Number: [id number] Date of Birth/Sex: Treating RN: 03/20/1956 (66 y.o. [name] Primary Care Provider: [name] Other Clinician: Referring Provider: Treating Provider/Extender: [name] in Treatment: 28 Visit Information History Since Last Visit Added or deleted any medications: No Patient Arrived: Cane Any new allergies or adverse reactions: No Arrival Time: 08:50 Had a fall or experienced change in No Accompanied By: self activities of daily living that may affect Transfer Assistance: None risk of falls: Patient Identification Verified: Yes Signs or symptoms of abuse/neglect since last visito No Secondary Verification Process Completed: Yes Hospitalized since last visit: No Patient Requires Transmission-Based Precautions: No Implantable device outside of the clinic excluding No Patient Has Alerts: Yes cellular tissue based products placed in the center Patient Alerts: L ABI =1.12, TBI = .92 since last visit: R ABI= 1.02, TBI= .58 Has Dressing in Place as Prescribed: Yes Pain Present Now: No Electronic Signature(s) Signed: 07/10/2021 2:07:25 PM By: [name] Entered By: [name] on 07/10/2021 08:50:40 -------------------------------------------------------------------------------- Compression Therapy Details Patient Name: Date of Service: [name] [name], [name] 07/10/2021 8:30 A M Medical Record Number: [id number] Patient Account Number: [id number] Date of Birth/Sex: Treating RN: 1956-02-22 (66 y.o. Debby [name] Primary Care Provider: [name] Other Clinician: Referring Provider: Treating Provider/Extender: [name] in Treatment: 54 Compression Therapy Performed for Wound Assessment:  Wound #15R Left,Medial Malleolus Performed By: Clinician Deon Pilling, RN Compression Type: Double Layer Post Procedure Diagnosis Same as Pre-procedure Electronic Signature(s) Signed: 07/10/2021 5:26:44 PM By: Deon Pilling RN, BSN Entered By: Deon Pilling on 07/10/2021 09:18:48 -------------------------------------------------------------------------------- Encounter Discharge Information Details Patient Name: Date of Service: [name] [name], [name] 07/10/2021 8:30 A M Medical Record Number: [id number] Patient Account Number: [id number] Date of Birth/Sex: Treating RN: 1955-10-27 (66 y.o. Debby [name] Primary Care Provider: [name] Other Clinician: Referring Provider: Treating Provider/Extender: [name] in Treatment: 25 Encounter Discharge Information Items Post Procedure Vitals Discharge Condition: Stable Temperature (F): 98.4 Ambulatory Status: Cane Pulse (bpm): 64 Discharge Destination: Home Respiratory Rate (breaths/min): 18 Transportation: Private Auto Blood Pressure (mmHg): 128/71 Accompanied By: self Schedule Follow-up Appointment: Yes Clinical Summary of Care: Electronic Signature(s) Signed: 07/10/2021 5:26:44 PM By: Deon Pilling RN, BSN Entered By: Deon Pilling on 07/10/2021 09:23:40 -------------------------------------------------------------------------------- Lower Extremity Assessment Details Patient Name: Date of Service: [name] [name], [name] 07/10/2021 8:30 A M Medical Record Number: [id number] Patient Account Number: [id number] Date of Birth/Sex: Treating RN: [name] Primary Care Provider: [name] Other Clinician: Referring Provider: Treating Provider/Extender: [name] in Treatment: 77 Edema Assessment Assessed: [Left: Yes] [Right: No] Edema: [Left: N] [Right: o] Calf Left: Right: Point of Measurement: 31 cm From Medial Instep 27 cm Ankle Left:  Right: Point of Measurement: 11 cm From Medial Instep 21 cm Vascular Assessment Pulses: Dorsalis Pedis Palpable: [Left:Yes] Electronic Signature(s) Signed: 07/10/2021 5:26:44 PM By: Deon Pilling RN, BSN Entered By: Deon Pilling on 07/10/2021 08:58:50 -------------------------------------------------------------------------------- [place] Details Patient Name: Date of Service: [name] [name], [name] 07/10/2021 8:30 A M Medical Record Number: [id number] Patient Account Number: [id number] Date of Birth/Sex: Treating RN: [birth year] (66 y.o. Debby [name] Primary Care Provider: [name] Other Clinician: Referring Provider:  Treating [name]/Extender: [name] Weeks in Treatment: 68 [place] reviewed with physician Active Inactive Venous Leg Ulcer Nursing Diagnoses: Actual venous Insuffiency (use after diagnosis is confirmed) Knowledge deficit related to disease process and management Goals: Patient will maintain optimal edema control Date Initiated: 06/20/2020 Target Resolution Date: 08/02/2021 Goal Status: Active Interventions: Assess peripheral edema status every visit. Compression as ordered Treatment Activities: Therapeutic compression applied : 06/20/2020 Notes: 01/30/21: Edema control ongoing, using CoFlex wrap. 06/05/21: Edema control continues Wound/Skin Impairment Nursing Diagnoses: Impaired tissue integrity Knowledge deficit related to ulceration/compromised skin integrity Goals: Patient/caregiver will verbalize understanding of skin care regimen Date Initiated: 01/17/2020 Target Resolution Date: 08/03/2021 Goal Status: Active Ulcer/skin breakdown will have a volume reduction of 30% by week 4 Date Initiated: 01/17/2020 Date Inactivated: 03/12/2020 Target Resolution Date: 03/09/2020 Goal Status: Met Interventions: Assess patient/caregiver ability to obtain necessary supplies Assess patient/caregiver  ability to perform ulcer/skin care regimen upon admission and as needed Assess ulceration(s) every visit Provide education on ulcer and skin care Notes: 01/30/21: Wound care regimen continues, PuraPly skin sub being used. 04/17/21: Wound healed 06/05/21: Wound re-opened, wound care regimen continues. Electronic Signature(s) Signed: 07/10/2021 5:26:44 PM By: Deon Pilling RN, BSN Entered By: Deon Pilling on 07/10/2021 08:59:56 -------------------------------------------------------------------------------- Pain Assessment Details Patient Name: Date of Service: [name] [name], [name] 07/10/2021 8:30 A M Medical Record Number: [id number] Patient Account Number: [id number] Date of Birth/Sex: Treating RN: Mar 29, 1956 (66 y.o. [name] Primary Care Sissi Padia: [name] Other Clinician: Referring Marlon Suleiman: Treating Khayman Kirsch/Extender: [name] in Treatment: 27 Active Problems Location of Pain Severity and Description of Pain Patient Has Paino No Site Locations Pain Management and Medication Current Pain Management: Electronic Signature(s) Signed: 07/10/2021 2:07:25 PM By: [name] Signed: 07/10/2021 4:24:10 PM By: [name] Entered By: [name] on 07/10/2021 08:51:09 -------------------------------------------------------------------------------- Patient/Caregiver Education Details Patient Name: Date of Service: [name], [name] 2/8/2023andnbsp8:30 A M Medical Record Number: [id number] Patient Account Number: [id number] Date of Birth/Gender: Treating RN: 01/31/1956 (66 y.o. Debby [name] Primary Care Physician: [name] Other Clinician: Referring Physician: Treating Physician/Extender: [name] in Treatment: 21 Education Assessment Education Provided To: Patient Education Topics Provided Wound/Skin Impairment: Handouts: Skin Care Do's and Dont's Methods: Explain/Verbal Responses:  Reinforcements needed Electronic Signature(s) Signed: 07/10/2021 5:26:44 PM By: Deon Pilling RN, BSN Entered By: Deon Pilling on 07/10/2021 09:00:25 -------------------------------------------------------------------------------- Wound Assessment Details Patient Name: Date of Service: [name] [name], [name] 07/10/2021 8:30 A M Medical Record Number: [id number] Patient Account Number: [id number] Date of Birth/Sex: Treating RN: May 09, 1956 (66 y.o. [name] Primary Care Alvin Rubano: [name] Other Clinician: Referring Alysia Scism: Treating Shloime Keilman/Extender: [name] in Treatment: 27 Wound Status Wound Number: [id number] Primary Venous Leg Ulcer Etiology: Wound Location: Left, Medial Malleolus Wound Status: Open Wounding Event: Gradually Appeared Comorbid Peripheral Venous Disease, Osteoarthritis, Confinement Date Acquired: 12/30/2019 History: Anxiety Weeks Of Treatment: 77 Clustered Wound: No Wound Measurements Length: (cm) 0.9 Width: (cm) 0.7 Depth: (cm) 0.2 Area: (cm) 0.495 Volume: (cm) 0.099 % Reduction in Area: 95.8% % Reduction in Volume: 98.9% Epithelialization: Small (1-33%) Tunneling: No Undermining: No Wound Description Classification: Full Thickness With Exposed Support Structures Wound Margin: Distinct, outline attached Exudate Amount: Medium Exudate Type: Serosanguineous Exudate Color: red, brown Foul Odor After Cleansing: No Slough/Fibrino No Wound Bed Granulation Amount: Large (67-100%) Exposed Structure Granulation Quality: Red, Pink Fascia Exposed: No Necrotic Amount: None Present (  0%) Fat Layer (Subcutaneous Tissue) Exposed: Yes Tendon Exposed: No Muscle Exposed: No Joint Exposed: No Bone Exposed: No Treatment Notes Wound #15R (Malleolus) Wound Laterality: Left, Medial Cleanser Wound Cleanser Discharge Instruction: Cleanse the wound with wound cleanser prior to applying a clean dressing using gauze sponges, not  tissue or cotton balls. Peri-Wound Care Sween Lotion (Moisturizing lotion) Discharge Instruction: Apply moisturizing lotion as directed Topical Primary Dressing Puraply AM Discharge Instruction: Rayli Wiederhold applied to wound bed. Secondary Dressing Woven Gauze Sponge, Non-Sterile 4x4 in Discharge Instruction: Apply over primary dressing as directed. ABD Pad, 8x10 Discharge Instruction: Apply over primary dressing as directed. ADAPTIC TOUCH 3x4.25 in Discharge Instruction: Apply over primary dressing. Secured With Compression Wrap CoFlex TLC XL 2-layer Compression System 4x7 (in/yd) Discharge Instruction: Apply CoFlex 2-layer compression as directed. (alt for 4 layer) Compression Stockings Add-Ons Electronic Signature(s) Signed: 07/10/2021 4:24:10 PM By: [name] Signed: 07/10/2021 5:26:44 PM By: Deon Pilling RN, BSN Entered By: Deon Pilling on 07/10/2021 08:58:01 -------------------------------------------------------------------------------- Vitals Details Patient Name: Date of Service: [name] [name], [name] 07/10/2021 8:30 A M Medical Record Number: [id number] Patient Account Number: [id number] Date of Birth/Sex: Treating RN: 02-10-1956 (66 y.o. [name] Primary Care Alyss Granato: [name] Other Clinician: Referring Jesusa Stenerson: Treating Kimorah Ridolfi/Extender: [name] in Treatment: 3 Vital Signs Time Taken: 08:50 Temperature (F): 98.4 Height (in): 68 Pulse (bpm): 64 Respiratory Rate (breaths/min): 18 Blood Pressure (mmHg): 128/71 Reference Range: 80 - 120 mg / dl Electronic Signature(s) Signed: 07/10/2021 2:07:25 PM By: [name] Entered By: [name] on 07/10/2021 08:50:59

## 2021-07-10 NOTE — Progress Notes (Addendum)
TARI, LECOUNT ([id number]) Visit Report for 07/10/2021 Chief Complaint Document Details Patient Name: Date of Service: [facility] [name], [name] 07/10/2021 8:30 A M Medical Record Number: [id number] Patient Account Number: [id number] Date of Birth/Sex: Treating RN: 1956-05-24 (66 y.o. [name] Primary Care Provider: [name] Other Clinician: Referring Provider: Treating Provider/Extender: [name] in Treatment: 32 Information Obtained from: Patient Chief Complaint patient is been followed long-term in this clinic for venous insufficiency ulcers with inflammation, hypertension and ulceration over the medial ankle bilaterally. 01/17/2020; this is a patient who is here for review of postoperative wounds on the left lateral ankle and recurrence of venous stasis ulceration on the left medial Electronic Signature(s) Signed: 07/10/2021 9:03:16 AM By: [name] PA-C Entered By: [name] on 07/10/2021 09:03:16 -------------------------------------------------------------------------------- Cellular or Tissue Based Product Details Patient Name: Date of Service: [facility] [name], [name] 07/10/2021 8:30 A M Medical Record Number: [id number] Patient Account Number: [id number] Date of Birth/Sex: Treating RN: 1956-05-05 (66 y.o. [name] Primary Care Provider: [name] Other Clinician: Referring Provider: Treating Provider/Extender: [name] in Treatment: 14 Cellular or Tissue Based Product Type Wound #15R Left,Medial Malleolus Applied to: Performed By: Physician [name], PA Cellular or Tissue Based Product Type: Puraply AM Level of Consciousness (Pre-procedure): Awake and Alert Pre-procedure Verification/Time Out Yes - 09:15 Taken: Location: trunk / arms / legs Wound Size (sq cm): 0.63 Product Size (sq cm): 2 Waste Size (sq cm): 2 Waste Reason: wound size Amount of Product Applied (sq cm):  0 Instrument Used: Forceps, Scissors Lot #: [id number] Order #: 2 Expiration Date: 07/19/2023 Fenestrated: No Reconstituted: Yes Solution Type: normal saline Solution Amount: 19mL Lot #: [id number] Solution Expiration Date: 01/31/2022 Secured: Yes Secured With: adaptic Dressing Applied: No Procedural Pain: 0 Post Procedural Pain: 0 Response to Treatment: Procedure was tolerated well Level of Consciousness (Post- Awake and Alert procedure): Post Procedure Diagnosis Same as Pre-procedure Electronic Signature(s) Signed: 07/10/2021 5:11:08 PM By: [name] PA-C Signed: 07/10/2021 5:26:44 PM By: Deon Pilling RN, BSN Entered By: Deon Pilling on 07/10/2021 09:17:58 -------------------------------------------------------------------------------- Debridement Details Patient Name: Date of Service: [name], [name] 07/10/2021 8:30 A M Medical Record Number: [id number] Patient Account Number: [id number] Date of Birth/Sex: Treating RN: 01/03/1956 (66 y.o. [name], [name] Primary Care Provider: [name] Other Clinician: Referring Provider: Treating Provider/Extender: [name] in Treatment: 77 Debridement Performed for Assessment: Wound #15R Left,Medial Malleolus Performed By: Physician [name], PA Debridement Type: Debridement Severity of Tissue Pre Debridement: Fat layer exposed Level of Consciousness (Pre-procedure): Awake and Alert Pre-procedure Verification/Time Out Yes - 09:10 Taken: Start Time: 09:11 Pain Control: Lidocaine 5% topical ointment T Area Debrided (L x W): otal 0.5 (cm) x 0.5 (cm) = 0.25 (cm) Tissue and other material debrided: Non-Viable, Callus, Skin: Dermis , Skin: Epidermis Level: Skin/Epidermis Debridement Description: Selective/Open Wound Instrument: Curette Bleeding: None End Time: 09:14 Procedural Pain: 0 Post Procedural Pain: 0 Response to Treatment: Procedure was tolerated well Level of Consciousness  (Post- Awake and Alert procedure): Post Debridement Measurements of Total Wound Length: (cm) 0.9 Width: (cm) 0.7 Depth: (cm) 0.2 Volume: (cm) 0.099 Character of Wound/Ulcer Post Debridement: Improved Severity of Tissue Post Debridement: Fat layer exposed Post Procedure Diagnosis Same as Pre-procedure Electronic Signature(s) Signed: 07/10/2021 5:11:08 PM By: [name] PA-C Signed: 07/10/2021 5:26:44 PM By: Deon Pilling RN, BSN Entered By: Rolin Barry,  Bobbi on 07/10/2021 09:15:40 -------------------------------------------------------------------------------- HPI Details Patient Name: Date of Service: [facility] [name], [name] 07/10/2021 8:30 A M Medical Record Number: [id number] Patient Account Number: [id number] Date of Birth/Sex: Treating RN: [birth year] (66 y.o. [name] Primary Care Provider: [name] Other Clinician: Referring Provider: Treating Provider/Extender: [name] in Treatment: 5 History of Present Illness HPI Description: the remaining wound is over the left medial ankle. Similar wound over the right medial ankle healed largely with use of Apligraf. Most recently we have been using Hydrofera Blue over this wound with considerable improvement. The patient has been extensively worked up in the past for her venous insufficiency and she is not a candidate for antireflux surgery although I have none of the details available currently. 08/24/14; considerable improvement today. About 50% of this wound areas now epithelialized. The base of the wound appears to be healthier granulation.as opposed to last week when she had deteriorated a considerable improvement 08/17/14; unfortunately the wound has regressed somewhat. The areas of epithelialization from the superior aspect are not nearly as healthy as they were last week. The patient thinks her Hydrofera Blue slipped. 09/07/14; unfortunately the area has markedly regressed in the 2 weeks since I've  seen this. There is an odor surrounding erythema. The healthy granulation tissue that we had at the base of the wound now is a dusky color. The nurse reports green drainage 09/14/14; the area looks somewhat better than last week. There is less erythema and less drainage. The culture I did did not show any growth. Nevertheless I think it is better to continue the Cipro and doxycycline for a further week. The remaining wound area was debridement. 09/21/14. Wound did not require debridement last week. Still less erythema and less drainage. She can complete her antibiotics. The areas of epithelialization in the superior aspect of the wound do not look as healthy as they did some weeks ago 10/05/14 continued improvement in the condition of this wound. There is advancing epithelialization. Less aggressive debridement required 10/19/14 continued improvement in the condition and volume of this wound. Less aggressive debridement to the inferior part of this to remove surface slough and fibrinous eschar 11/02/14 no debridement is required. The surface granulation appears healthy although some of her islands of epithelialization seem to have regressed. No evidence of infection 11/16/14; lites surface debridement done of surface eschar. The wound does not look to be unhealthy. No evidence of infection. Unfortunately the patient has had podiatry issues in the right foot and for some reason has redeveloped small surface ulcerations in the medial right ankle. Her original presentation involved wounds in this area 11/23/14 no debridement. The area on the right ankle has enlarged. The left ankle wound appears stable in terms of the surface although there is periwound inflammation. There has been regression in the amount of new skin 11/30/14 no debridement. Both wound areas appear healthy. There was no evidence of infection. The the new area on the right medial ankle has enlarged although that both the surfaces appear to be  stable. 12/07/14; Debridement of the right medial ankle wound. No no debridement was done on the left. 12/14/14 no major change in and now bilateral medial ankle wounds. Both of these are very painful but the no overt evidence of infection. She has had previous venous ablation 12/21/14; patient states that her right medial ankle wound is considerably more painful last week than usual. Her left is also somewhat painful. She could not tolerate  debridement. The right medial ankle wound has fibrinous surface eschar 12/28/14 this is a patient with severe bilateral venous insufficiency ulcers. For a considerable period of time we actually had the one on the right medial ankle healed however this recently opened up again in June. The left medial ankle wound has been a refractory area with some absent flows. We had some success with Hydrofera Blue on this area and it literally closed by 50% however it is recently opened up Foley. Both of these were debridement today of surface eschar. She tolerates this poorly 01/25/15: No change in the status of this. Thick adherent escar. Very poor tolerance of any attempt at debridement. I had healed the right medial malleolus wound for a considerable amount of time and had the left one down to about 50% of the volume although this is totally regressed over the last 48 weeks. Further the right leg has reopened. she is trying to make a appointment with pain and vascular, previous ablations with Dr. Aleda Grana. I do not believe there is an arterial insufficiency issue here 02/01/15 the status of the adherent eschar bilaterally is actually improved. No debridement was done. She did not manage to get vascular studies done 02/08/15 continued debridement of the area was done today. The slough is less adherent and comes off with less pressure. There is no surrounding infection peripheral pulses are intact 02/15/15 selective debridement with a disposable curette. Again the slough is less  adherent and comes off with less difficulty. No surrounding infection peripheral pulses are intact. 02/22/15 selective debridement of the right medial ankle wound. Slough comes off with less difficulty. No obvious surrounding infection peripheral pulses are intact I did not debridement the one on the left. Both of these are stable to improved 03/01/15 selective debridement of both wound areas using a curette to. Adherent slough cup soft with less difficulty. No obvious surrounding infection. The patient tells me that 2 days ago she noted a rash above the right leg wrap. She did not have this on her lower legs when she change this over she arrives with widespread left greater than right almost folliculitis-looking rash which is extremely pruritic. I don't see anything to culture here. There is no rash on the rest of her body. She feels well systemically. 03/08/15; selective debridement of both wounds using a curette. Base of this does not look unhealthy. She had limegreen drainage coming out of the left leg wound and describes a lot of drainage. The rash on her left leg looks improved to. No cultures were done. 03/22/15; patient was not here last week. Basal wounds does not look healthy and there is no surrounding erythema. No drainage. There is still a rash on the left leg that almost looks vasculitic however it is clearly limited to the top of where the wrap would be. 04/05/15; on the right required a surgical debridement of surface eschar and necrotic subcutaneous tissue. I did not debridement the area on the left. These continue to be large open wounds that are not changing that much. We were successful at one point in healing the area on the right, and at the same time the area on the left was roughly half the size of current measurements. I think a lot of the deterioration has to do with the prolonged time the patient is on her feet at work 04/19/15 I attempted-like surface debridement bilaterally  she does not tolerate this. She tells me that she was in allergic care yesterday with extreme  pain over her left lateral malleolus/ankle and was told that she has an "sprain" 05/03/15; large bilateral venous insufficiency wounds over the medial malleolus/medial aspect of her ankles. She complains of copious amounts of drainage and his usual large amounts of pain. There is some increasing erythema around the wound on the right extending into the medial aspect of her foot to. historically she came in with these wounds the right one healed and the left one came down to roughly half its current size however the right one is reopened and the left is expanded. 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 05/10/15 large bilateral venous insufficiency wounds. There is less adherence surface left however the surface culture that I did last week grew pseudomonas therefore bilateral selective debridement score necessary. There is surrounding erythema. The patient describes severe bilateral drainage and a lot of pain in the left ankle. Apparently her podiatrist was were ready to do a cortisone shot 05/17/15; the patient complains of pain and again copious amounts of drainage. 05/24/15; we used Iodo flex last week. Patient notes considerable improvement in wound drainage. Only needed to change this once. 05/31/15; we continued Iodoflex; the base of these large wounds bilaterally is not too bad but there is probably likely a significant bioburden here. I would like to debridement just doesn't tolerate it. 06/06/14 I would like to continue the Iodoflex although she still hasn't managed to obtain supplies. She has bilateral medial malleoli or large wounds which are mostly superficial. Both of them are covered circumferentially with some nonviable fibrinous slough although she tolerates debridement very poorly. She apparently has an appointment for an ablation on the right  leg by interventional radiology. 06/14/15; the patient arrives with the wounds and static condition. We attempted a debridement although she does not do well with this secondary to pain. I 07/05/15; wounds are not much smaller however there appears to be a cleaner granulating base. The left has tight fibrinous slough greater than the right. Debridement is tolerated poorly due to pain. Iodoflex is done more for these wounds in any of the multitude of different dressings I have tried on the left 1 and then subsequently the right. 07/12/15; no change in the condition of this wound. I am able to do an aggressive debridement on the right but not the left. She simply cannot tolerate it. We have been using Iodoflex which helps somewhat. It is worthwhile remembering that at one point we healed the right medial ankle wound and the left was about 25% of the current circumference. We have suggested returning to vascular surgery for review of possible further ablations for one reason or another she has not been able to do this. 07/26/15 no major change in the condition of either wound on her medial ankle. I did not attempt to debridement of these. She has been aggressively scrubbing these while she is in the shower at home. She has her supply of Iodoflex which seems to have done more for these wounds then anything I have put on recently. 08/09/15 wound area appears larger although not verified by measurements. Using Iodoflex 09/05/2015 -- she was here for avisit today but had significant problems with the wound and I was asked to see her for a physician opinion. I have summarize that this lady has had surgery on her left lower extremity about 10 years ago where the possible veins stripping was done. She has had an  opinion from interventional radiology around November 2016 where no further sclerotherapy was ordered. The patient works 12 hours a day and stands on a concrete floor with work boots and is unable to get the  proper compression she requires and cannot elevate her limbs appropriately at any given time. She has recently grown Pseudomonas from her wound culture but has not started her ciprofloxacin which was called in for her. 09/13/15 this continues to be a difficult situation for this patient. At one point I had this wound down to a 1.5 x 1.5" wound on her left leg. This is deteriorated and the right leg has reopened. She now has substantial wounds on her medial calcaneus, malleoli and into her lower leg. One on the left has surface eschar but these are far too painful for me to debridement here. She has a vascular surgery appointment next week to see if anything can be done to help here. I think she has had previous ablations several years ago at [facility]. She has no major edema. She tells me that she did not get product last time [facility]] and went for several days without it. She continues to work in work boots 12 hours a day. She cannot get compression/4-layer under her work boots. 09/20/15 no major change. Periwound edema control was not very good. Her point with pain and vascular is next Wednesday the 25th 09/28/15; the patient is seen vascular surgery and is apparently scheduled for repeat duplex ultrasounds of her bilateral lower legs next week. 10/05/15; the patient was seen by Dr. Doren Custard of vascular surgery. He feels that she should have arterial insufficiency excluded as cause/contributed to her nonhealing stage she is therefore booked for an arteriogram. She has apparently monophasic signals in the dorsalis pedis pulses. She also of course has known severe chronic venous insufficiency with previous procedures as noted previously. I had another long discussion with the patient today about her continuing to work 12 hour shifts. I've written her out for 2 months area had concerns about this as her work location is currently undergoing significant turmoil and this may lead to her termination. She  is aware of this however I agree with her that she simply cannot continue to stand for 12 hours multiple days a week with the substantial wound areas she has. 10/19/15; the Dr. Doren Custard appointment was largely for an arteriogram which was normal. She does not have an arterial issue. He didn't make a comment about her chronic venous insufficiency for which she has had previous ablations. Presumably it was not felt that anything additional could be done. The patient is now out of work as I prescribed 2 weeks ago. Her wounds look somewhat less aggravated presumably because of this. I felt I would give debridement another try today 10/25/15; no major change in this patient's wounds. We are struggling to get her product that she can afford into her own home through her insurance. 11/01/15; no major change in the patient's wounds. I have been using silver alginate as the most affordable product. I spoke to Dr. Marla Roe last week with her requested take her to the OR for surgical debridement and placement of ACEL. Dr. Marla Roe told me that she would be willing to do this however [facility] will not cover this, fortunately the patient has [place] healthcare of some variant 11/08/15; no major change in the patient's wounds. She has been completely nonviable surface that this but is in too much pain with any attempted debridement  are clinic. I have arranged for her to see Dr. Marla Roe ham of plastic surgery and this appointment is on Monday. I am hopeful that they will take her to the OR for debridement, possible ACEL ultimately possible skin graft 11/22/15 no major change in the patient's wounds over her bilateral medial calcaneus medial malleolus into the lower legs. Surface on these does not look too bad however on the left there is surrounding erythema and tenderness. This may be cellulitis or could him sleepy tinea. 11/29/15; no major changes in the patient's wounds over her bilateral medial  malleolus. There is no infection here and I don't think any additional antibiotics are necessary. There is now plan to move forward. She sees Dr. Marla Roe in a week's time for preparation for operative debridement and ACEL placement I believe on 7/12. She then has a follow-up appointment with Dr. Marla Roe on 7/21 12/28/15; the patient returns today having been taken to the [place] by Dr. Marla Roe 12/12/15 she underwent debridement, intraoperative cultures [which were negative]. She had placement of a wound VAC. Parent really ACEL was not available to be placed. The wound VAC foam apparently adhered to the wound since then she's been using silver alginate, Xeroform under Ace wraps. She still says there is a lot of drainage and a lot of pain 01/31/16; this is a patient I see monthly. I had referred her to Dr. Marla Roe him of plastic surgery for large wounds on her bilateral medial ankles. She has been to the OR twice once in early July and once in early August. She tells me over the last 3 weeks she has been using the wound VAC with ACEL underneath it. On the right we've simply been using silver alginate. Under Kerlix Coban wraps. 02/28/16; this is a patient I'm currently seeing monthly. She is gone on to have a skin graft over her large venous insufficiency ulcer on the left medial ankle. This was done by Dr. Marla Roe him. The patient is a bit perturbed about why she didn't have one on her right medial ankle wound. She has been using silver alginate to this. 03/06/16; I received a phone call from her plastic surgery Dr. Marla Roe. She expressed some concern about the viability of the skin graft she did on the left medial ankle wound. Asked me to place Endoform on this. She told me she is not planning to do a subsequent skin graft on the right as the left one did not take very well. I had placed Hydrofera Blue on the right 03/13/16; continue to have a reasonably healthy wound on the right medial ankle.  Down to 3 mm in terms of size. There is epithelialization here. The area on the left medial ankle is her skin graft site. I suppose the last week this looks somewhat better. She has an open area inferiorly however in the center there appears to be some viable tissue. There is a lot of surface callus and eschar that will eventually need to come off however none of this looked to be infected. Patient states that the is able to keep the dressing on for several days which is an improvement. 03/20/16 no major change in the circumference of either wound however on the left side the patient was at Dr. Eusebio Friendly office and they did a debridement of left wound. 50% of the wound seems to be epithelialized. I been using Endoform on the left Hydrofera Blue in the right 03/27/16; she arrives today with her wound is not looking as  healthy as they did last week. The area on the right clearly has an adherent surface to this a very similar surface on the left. Unfortunately for this patient this is all too familiar problem. Clearly the Endoform is not working and will need to change that today that has some potential to help this surface. She does not tolerate debridement in this clinic very well. She is changing the dressing wants 04/03/16; patient arrives with the wounds looking somewhat better especially on the right. Dr. Migdalia Dk change the dressing to silver alginate when she saw her on Monday and also sold her some compression socks. The usefulness of the latter is really not clear and woman with severely draining wounds. 04/10/16; the patient is doing a bit of an experiment wearing the compression stockings that Dr. Migdalia Dk provided her to her left leg and the out of legs based dressings that we provided to the right. 05/01/16; the patient is continuing to wear compression stockings Dr. Migdalia Dk provided her on the left that are apparently silver impregnated. She has been using Iodoflex to the right leg wound. Still  a moderate amount of drainage, when she leaves here the wraps only last for 4 days. She has to change the stocking on the left leg every night 05/15/16; she is now using compression stockings bilaterally provided by Dr. Marla Roe. She is wearing a nonadherent layer over the wounds so really I don't think there is anything specific being done to this now. She has some reduction on the left wound. The right is stable. I think all healing here is being done without a specific dressing 06/09/16; patient arrives here today with not much change in the wound certainly in diameter to large circular wounds over the medial aspect of her ankle bilaterally. Under the light of these services are certainly not viable for healing. There is no evidence of surrounding infection. She is wearing compression stockings with some sort of silver impregnation as prescribed by Dr. Marla Roe. She has a follow-up with her tomorrow. 06/30/16; no major change in the size or condition of her wounds. These are still probably covered with a nonviable surface. She is using only her purchase stockings. She did see Dr. Marla Roe who seemed to want to apply Dakin's solution to this I'm not extreme short what value this would be. I would suggest Iodoflex which she still has at home. 07/28/16; I follow Mrs. Martin episodically along with Dr. Marla Roe. 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 She has been applying a topical collagen ointment to both wounds with Adaptic. I don't think Dr. Marla Roe is planning to take her back to the OR. 08/19/16; I follow Mrs. Jeneen Rinks on a monthly basis along with Dr. Marla Roe of plastic surgery. 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 I been following her for a number of years. At one point I was able to get the right medial malleolus wound to heal and had the left  medial malleolus down to about half its current size however and I had to send her to plastic surgery for an operative debridement. Since then things have been stable to slightly improve the area on the right is slightly better one in the left about the same although there is much less adherent surface than I'm used to with this patient. She is using some form of liquid collagen gel that Dr. Marla Roe provided a Kerlix cover  with the patient's own pressure stockings. She tells me that she has extreme pain in both ankles and along the lateral aspect of both feet. She has been unable to work for some period of time. She is telling me she is retiring at the beginning of April. She sees Dr. Doran Durand of orthopedics next week 09/22/16; patient has not seen Dr. Marla Roe since the last time she is here. I'm not really sure what she is using to the wounds other than bits and pieces of think she had left over including most recently Hydrofera Blue. She is using juxtalite stockings. She is having difficulty with her husband's recent illness "stroke". She is having to transport him to various doctors appointments. Dr. Marla Roe left her the option of a repeat debridement with ACEL however she has not been able to get the time to follow-up on this. She continues to have a fair amount of drainage out of these wounds with certainly precludes leaving dressings on all week 10/13/16; patient has not seen Dr. Marla Roe since she was last in our clinic. I'm not really sure what she is doing with the wounds, we did try to get her [facility] and I think she is actually using this most of the time. Because of drainage she states she has to change this every second day although this is an improvement from what she used to do. She went to see Dr. Doran Durand who did not think she had a muscular issue with regards to her feet, he referred her to a neurologist and I think the appointment is sometime in June. I changed her back to  Iodoflex which she has used in the past but not recently. 11/03/16; the patient has been using Iodoflex although she ran out of this. Still claims that there is a lot of drainage although the wound does not look like this. No surrounding erythema. She has not been back to see Dr. Marla Roe 11/24/16; the patient has been using Iodoflex again but she ran out of it 2 or 3 days ago. There is no major change in the condition of either one of these wounds in fact they are larger and covered in a thick adherent surface slough/nonviable tissue especially on the left. She does not tolerate mechanical debridement in our clinic. Going back to see Dr. Marla Roe of plastic surgery for an operative debridement would seem reasonable. 12/15/16; the patient has not been back to see Dr. Marla Roe. She is been dealing with a series of illnesses and her husband which of monopolized her time. She is been using Sorbact which we largely supplied. She states the drainage is bad enough that it maximum she can go 2-3 days without changing the dressing 01/12/2017 -- the patient has not been back for about 4 weeks and has not seen Dr. Marla Roe not does she have any appointment pending. 01/23/17; patient has not seen Dr. Marla Roe even though I suggested this previously. She is using Santyl that was suggested last week by Dr. Con Memos this Cost her $16 through her insurance which is indeed surprising 02/12/17; continuing Santyl and the patient is changing this daily. A lot of drainage. She has not been back to see plastic surgery she is using an Ace wrap. Our intake nurse suggested wrap around stockings which would make a good reasonable alternative 02/26/17; patient is been using Santyl and changing this daily due to drainage. She has not been to see plastic surgery she uses in April Ace wrap to control the edema. She  did obtain extremitease stockings but stated that the edema in her leg was to big for these 03/20/17; patient is  using Santyl and Anasept. Surfaces looked better today the area on the right is actually measuring a little smaller. She has states she has a lot of pain in her feet and ankles and is asking for a consult to pain control which I'll try to help her with through our case manager. 04/10/17; the patient arrives with better-looking wound surfaces and is slightly smaller wound on the left she is using a combination of Santyl and Anasept. She has an appointment or at least as started in the pain control center associated with [facility] 05/14/17; this is a patient who I followed for a prolonged period of time. She has venous insufficiency ulcers on her bilateral medial ankles. At one point I had this down to a much smaller wound on the left however these reopened and we've never been able to get these to heal. She has been using Santyl and Anasept gel although 2 weeks ago she ran out of the Anasept gel. She has a stable appearance of the wound. She is going to the wound care clinic at [facility]. They wanted do a nerve block/spinal block although she tells me she is reluctant to go forward with that. 05/21/17; this is a patient I have followed for many years. She has venous insufficiency ulcers on her bilateral medial ankles. Chronic pain and deformity in her ankles as well. She is been to see plastic surgery as well as orthopedics. Using PolyMem AG most recently/Kerramax/ABDs and 2 layer compression. She has managed to keep this on and she is coming in for a nurse check to change the dressing on Tuesdays, we see her on Fridays 06/05/17; really quite a good looking surface and the area especially on the right medial has contracted in terms of dimensions. Well granulated healthy-looking tissue on both sides. Even with an open curet there is nothing that even feels abnormal here. This is as good as I've seen this in quite some time. We have been using PolyMem AG and bringing her in for a nurse  check 06/12/17; really quite good surface on both of these wounds. The right medial has contracted a bit left is not. We've been using PolyMem and AG and she is coming in for a nurse visit 06/19/17; we have been using PolyMem AG and bringing her in for a nurse check. Dimensions of her wounds are not better but the surfaces looked better bilaterally. She complained of bleeding last night and the left wound and increasing pain bilaterally. She states her wound pain is more neuropathic than just the wounds. There was some suggestion that this was radicular from her pain management doctor in talking to her it is really difficult to sort this out. 06/26/17; using PolyMem and AG and bringing her in for a nurse check as All of this and reasonably stable condition. Certainly not improved. The dimensions on the lateral part of the right leg look better but not really measuring better. The medial aspect on the left is about the same. 07/03/16; we have been using PolyMen AG and bringing her in for a nurse check to change the dressings as the wounds have drainage which precludes once weekly changing. We are using all secondary absorptive dressings.our intake nurse is brought up the idea of using a wound VAC/snap VAC on the wound to help with the drainage to see if this would result in  some contraction. This is not a bad idea. The area on the right medial is actually looking smaller. Both wounds have a reasonable-looking surface. There is no evidence of cellulitis. The edema is well controlled 07/10/17; the patient was denied for a snap VAC by her insurance. The major issue with these wounds continues to be drainage. We are using wicked PolyMem AG and she is coming in for a nurse visit to change this. The wounds are stable to slightly improved. The surface looks vibrant and the area on the right certainly has shrunk in size but very slowly 07/17/17; the patient still has large wounds on her bilateral medial malleoli.  Surface of both of these wounds looks better. The dimensions seem to come and go but no consistent improvement. There is no epithelialization. We do not have options for advanced treatment products due to insurance issues. They did not approve of the wound VAC to help control the drainage. More recently we've been using PolyMem and AG wicked to allow drainage through. We have been bringing her in for a nurse visit to change this. We do not have a lot of options for wound care products and the home again due to insurance issues 07/24/17; the patient's wound actually looks somewhat better today. No drainage measurements are smaller still healthy-looking surface. We used silver collagen under PolyMen started last week. We have been bringing her in for a dressing change 07/31/17; patient's wound surface continued to look better and I think there is visible change in the dimensions of the wound on the right. Rims of epithelialization. We have been using silver collagen under PolyMen and bringing her in for a dressing change. There appears to be less drainage although she is still in need of the dressing change 08/07/17. Patient's wound surface continues to look better on both sides and the area on the right is definitely smaller. We have been using silver collagen and PolyMen. She feels that the drainage has been it has been better. I asked her about her vascular status. She went to see Dr. Aleda Grana at [facility] and had some form of ablation. I don't have much detail on this. I haven't my notes from 2016 that she was not a candidate for any further ablation but I don't have any more information on this. We had referred her to vein and vascular I don't think she ever went. He does not have a history of PAD although I don't have any information on this either. We don't even have ABIs in our record 08/14/17; we've been using silver collagen and PolyMen cover. And putting the patient and compression. She we  are bringing her in as a nurse visit to change this because ofarge amount of drainage. We didn't the ABIs in clinic today since they had been done in many moons 1.2 bilaterally. She has been to see vein and vascular however this was at [facility] and she had ablation although I really don't have any information on this all seemed biking get a report. She is also been operatively debrided by plastic surgery and had a cell placed probably 8-12 months ago. This didn't have a major effect. We've been making some gains with current dressings 08/19/17-She is here in follow-up evaluation for bilateral medial malleoli ulcers. She continues to tolerate debridement very poorly. We will continue with recently changed topical treatment; if no significant improvement may consider switching to Iodosorb/Iodoflex. 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 She  has been using silver collagen and PolyMem. I believe she has been used and tried on Iodoflex before. 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 These have been very refractory over time. We have been using silver collagen and PolyMen. She is coming in once a week for a doctor's and once a week for nurse visits. We are actually making some progress 09/18/17; the patient's wounds are smaller especially on the right medial. She arrives today to upset to consider even washing these off with Anasept which I think is been part of the reason this is been closing. We've been using collagen covered in PolyMen otherwise. It is noted that she has a small area of folliculitis on the right medial calf that. As we are wrapping her legs I'll give her a short course of doxycycline to make sure this doesn't amount to anything. She is a  long list of complaints today including imbalance, shortness of breath on exertion, inversion of her left ankle. With regards to the latter complaints she is been to see orthopedics and they offered her a tendon release surgery I believe but wanted her wounds to be closed first. I have recommended she go see her primary physician with regards to everything else. 09/25/17; patient's wounds are about the same size. We have made some progress bilaterally although not in recent weeks. She will not allow me T wash these wounds with Anasept even if she is doing her cell. Wheeze we've been using collagen covered in PolyMen. Last week she had a small area of folliculitis this is now opened into a small wound. She completed 5 days of trimethoprim sulfamethoxazole 10/02/17; unfortunately the area on her left medial ankle is worse with a larger wound area towards the Achilles. The patient complains of a lot of pain. She will not allow debridement although visually I don't think there is anything to debridement in any case. We have been using silver collagen and PolyMen for several months now. Initially we are making some progress although I'm not really seeing that today. We will move back to [facility]. His admittedly this is a bit of a repeat however I'm hoping that his situation is different now. The patient tells me she had her leg on the left give out on her yesterday this is process some pain. 10/09/17; the patient is seen twice a week largely because of drainage issues coming out of the chronic medial bimalleolar wounds that are chronic. Last week the dimensions of the one on the left looks a little larger I changed her to [facility]. She comes in today with a history of terrible pain in the bilateral wound areas. She will not allow debridement. She will not even allow a tissue culture. There is no surrounding erythema no no evidence of cellulitis. We have been putting her Kerlix Coban man. She will  not allow more aggressive compression as there was a suggestion to put her in 3 layer wraps. 10/16/17; large wounds on her bilateral medial malleoli. These are chronic. Not much change from last week. The surface looks have healthy but absolutely no epithelialization. A lot of pain little less so of drainage. She will not allow debridement or even washing these off in the vigorous fashion with Anasept. 10/23/17; large wounds on her bilateral malleoli which are chronic.  Some improvement in terms of size perhaps on the right since last time I saw these. She states that after we increased the 3 layer compression there was some bleeding, when she came in for a nurse visit she did not want 3 layer compression put back on about our nurse managed to convince her. She has known chronic venous visit issues and I'm hoping to get her to tolerate the 3 layer compression. using Hydrofera Blue 10/30/17; absolutely no change in the condition of either wound although we've had some improvement in dimensions on the right.. Attempted to put her in 3 layer compression she didn't tolerated she is back in 2 layer compression. We've been using Hydrofera Blue We looked over her past records. She had venous reflux studies in November 2016. There was no evidence of deep venous reflux on the right. Superficial vein did not show the greater saphenous vein at think this is been previously ablated the small saphenous vein was within normal limits. The left deep venous system showed no DVT the vessels were positive for deep venous reflux in the posterior tibial veins at the ankle. The greater saphenous vein was surgically absent small saphenous vein was within normal limits. She went to vein and vascular at [facility]. I believe she had an ablation on the left greater saphenous vein. I'll update her reflux studies perhaps ever reviewed by vein and vascular. We've made absolutely no progress in these wounds. Will also try to read  and TheraSkins through her insurance 11/06/17; W the patient apparently has a 2 week follow-up with vein and vascular I like him to review the whole issue with regards to her previous vascular workup by Dr. Aleda Grana. We've really made no progress on these wounds in many months. She arrives today with less viable looking surface on the left medial ankle wound. This was apparently looking about the same on Tuesday when she was here for nurse visit. 11/13/17; deep tissue culture I did last time of the left lower leg showed multiple organisms without any predominating. In particular no Staphylococcus or group A strep were isolated. We sent her for venous reflux studies. She's had a previous left greater saphenous vein stripping and I think sclerotherapy of the right greater saphenous vein. She didn't really look at the lesser saphenous vein this both wounds are on the medial aspect. She has reflux in the common femoral vein and popliteal vein and an accessory vein on the right and the common femoral vein and popliteal vein on the left. I'm going to have her go to see vein and vascular just the look over things and see if anything else beside aggressive compression is indicated here. We have not been able to make any progress on these wounds in spite of the fact that the surface of the wounds is never look too bad. 11/20/17; no major change in the condition of the wounds. Patient reports a large amount of drainage. She has a lot of complaints of pain although enlisting her today I wonder if some of this at least his neuropathic rather than secondary to her wounds. She has an appointment with vein and vascular on 12/30/17. The refractory nature of these wounds in my mind at least need vein and vascular to look over the wounds the recent reflux studies we did and her history to see if anything further can be done here. I also note her gait is deteriorated quite a bit. Looks like she has inversion of her foot  on the right. She has a bilateral Trendelenburg gait. I wonder if this is neuropathic or perhaps multilevel radicular. 11/27/17; her wounds actually looks slightly better. Healthy-looking granulation tissue a scant amount of epithelialization. [place] healthcare will not pay for [employer]. They will play for tri layer Oasis and Dermagraft. This is not a diabetic ulcer. We'll try for the tri layer Oasis. She still complains of some drainage. She has a vein and vascular appointment on 12/30/17 12/04/17; the wounds visually look quite good. Healthy-looking granulation with some degree of epithelialization. We are still waiting for response to our request for trial to try layer Oasis. Her appointment with vascular to review venous and arterial issues isn't sold the end of July 7/31. Not allow debridement or even vigorous cleansing of the wound surface. 12/18/17; slightly smaller especially on the right. 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 We've been using Hydrofera Blue. We still are looking into triple layer Oasis through her insurance 01/08/18 on evaluation today patient's wound actually appears to be showing signs of good improvement at this point in time. She has been tolerating the dressing changes without complication. Fortunately there does not appear to be any evidence of infection at this point in time. We have been utilizing silver nitrate which does seem to be of benefit for her which is also good news. Overall I'm very happy with how things seem to be both regards appearance as well as measurement. Patient did see Dr. Bridgett Larsson for evaluation on 12/30/17. In his assessment he felt that stripping would not likely add much more than chronic compression to the patient's healing process. His recommendation was to follow-up in three months with Dr. Doren Custard if she hasn't healed in order to consider referral back to you and see vascular where she previously was in a trial and was  able to get her wound to heal. I'll be see what she feels she when you staying compression and he reiterated this as well. 01/13/18 on evaluation today patient appears to actually be doing very well in regard to her bilateral medial malleolus ulcers. She seems to have tolerated the chemical cauterization with silver nitrate last week she did have some pain through that evening but fortunately states that I'll be see since it seems to be doing better she is overall pleased with the progress. 01/21/18; really quite a remarkable improvement since I've last seen these wounds. We started using silver nitrate specially on the islands of hyper granulation which for some reason her around the wound circumference. This is really done quite nicely. Primary dressing Hydrofera Blue under 4 layer compression. She seems to be able to hold out without a nurse rewrap. Follow-up in 1 week 01/28/18; we've continued the hydrofera blue but continued with chemical cauterization to the wound area that we started about a month ago for irregular hyper granulation. She is made almost stunning improvement in the overall wound dimensions. I was not really expecting this degree of improvement in these chronic wounds 02/05/18; we continue with Hydrofera Bluebut of also continued the aggressive chemical cauterization with silver nitrate. We made nice progress with the right greater than left wound. 02/12/18. We continued with Hydrofera Blue after aggressive chemical cauterization with silver nitrate. We appear to be making nice progress with both wound areas 02/19/2018; we continue with [facility] after washing the wounds vigorously with Anasept spray and chemical cauterization with silver nitrate. We are making excellent progress. The area on the right's just about closed 02/26/2018. The area  on the left medial ankle had too much necrotic debris today. I used a #5 curette we are able to get most of the soft. I continued with  the silver nitrate to the much smaller wound on the right medial ankle she had a new area on her right lower pretibial area which she says was due to a role in her compression 03/05/2018; both wound areas look healthy. Not much change in dimensions from last week. I continue to use silver nitrate and Hydrofera Blue. The patient saw Dr. Doren Custard of vein and vascular. He felt she had venous stasis ulcers. He felt based on her previous arteriogram she should have adequate circulation for healing. Also she has deep venous reflux but really no significant correctable superficial venous reflux at this time. He felt we should continue with conservative management including leg elevation and compression 04/02/2018; since we last saw this woman about a month ago she had a fall apparently suffered a pelvic fracture. I did not look up the x-ray. Nevertheless because of pain she literally was bedbound for 2 weeks and had home health coming out to change the dressing. Somewhat predictably this is resulted in considerable improvement in both wound areas. The right is just about closed on the medial malleolus and the left is about half the size. 04/16/2018; both her wounds continue to go down in size. Using Hydrofera Blue. 05/07/18; both her wounds appeared to be improving especially on the right where it is almost closed. We are using Hydrofera Blue 05/14/2018; slightly worse this week with larger wounds. Surface on the left medial not quite as good. We have been using Hydrofera Blue 05/21/18; again the wounds are slightly larger. Left medial malleolus slightly larger with eschar around the circumference. We have been using Hydrofera Blue undergoing a wraps for a prolonged period of time. This got a lot better when she was more recumbent due to a fall and a back injury. I change the primary dressing the silver alginate today. She did not tolerate a 4 layer compression previously although I may need to bring this up with  her next time 05/28/2018; area on the left medial malleolus again is slightly larger with more drainage. Area on the right is roughly unchanged. She has a small area of folliculitis on the right medial just on the lower calf. This does not look ominous. 06/03/2018 left medial malleolus slightly smaller in a better looking surface. We used silver nitrate on this last time with silver alginate. The area on the right appears slightly smaller 1/10; left medial malleolus slightly smaller. Small open area on the right. We used silver nitrate and silver alginate as of 2 weeks ago. We continue with the wound and compression. These got a lot better when she was off her feet 1/17; right medial malleolus wound is smaller. The left may be slightly smaller. Both surfaces look somewhat better. 1/24; both wounds are slightly smaller. Using silver alginate under Unna boots 1/31; both wounds appear smaller in fact the area on the right medial is just about closed. Surface eschar. We have been using silver alginate under Unna boots. The patient is less active now spends let much less time on her feet and I think this is contributed to the general improvement in the wound condition 2/7; both wounds appear smaller. I was hopeful the right medial would be closed however there there is still the same small open area. Slight amount of surface eschar on the left the dimensions are smaller  there is eschar but the wound edges appear to be free. We have been using silver alginate under Unna boot's 2/14; both wounds once again measure smaller. Circumferential eschar on the left medial. We have been using silver alginate under Unna boots with gradual improvement 2/21; the area on the right medial malleolus has healed. The area on the left is smaller. We have been using silver alginate and Unna boots. We can discharge wrapping the right leg she has 20/30 stockings at home she will need to protect the scar tissue in this area 2/28;  the area on the right medial malleolus remains closed the patient has a compression stocking. The area on the left is smaller. We have been using silver alginate and Unna boots. 3/6 the area on the right medial ankle remains closed. Good edema control noted she is using her own compression stocking. The area on the left medial ankle is smaller. We have been managing this with silver alginate and Unna boots which we will continue today. 3/13; the area on the right medial ankle remains closed and I'm declaring it healed today. When necessary the left is about the same still a healthy-looking surface but no major change and wound area. No evidence of infection and using silver alginate under unna and generally making considerable improvement 3/27 the area on the right medial ankle remains closed the area on the left is about the same as last week. Certainly not any worse we have been using silver alginate under an Unna boot 4/3; the area on the right medial ankle remains closed per the patient. We did not look at this wound. The wound on the left medial ankle is about the same surface looks healthy we have been using silver alginate under an Unna boot 4/10; area on the right medial ankle remains closed per the patient. We did not look at this wound. The wound on the left medial ankle is slightly larger. The patient complains that the [facility] caused burning pain all week. She also told us that she was a lot more active this week. Changed her back to silver alginate 4/17; right medial ankle still closed per the patient. Left medial ankle is slightly larger. Using silver alginate. She did not tolerate Hydrofera Blue on this area 4/24; right medial ankle remains closed we have not look at this. The left medial ankle continues to get larger today by about a centimeter. We have been using silver alginate under Unna boots. She complains about 4 layer compression as an alternative. She has been up on her  feet working on her garden 5/8; right medial ankle remains closed we did not look at this. The left medial ankle has increased in size about 100%. We have been using silver alginate under Unna boots. She noted increased pain this week and was not surprised that the wound is deteriorated 5/15; no major change in SA however much less erythema ( one week of doxy ocellulitis). 5/22-66 year old female returns at 1 week to the clinic for left medial ankle wound for which we have been using silver alginate under 3 layer compression She was placed on DOXY at last visit - the wound is wider at this visit. She is in 3 layer compression 5/29; change to [facility] last week. I had given her empiric doxycycline 2 weeks ago for a week. She is in 3 layer compression. She complains of a lot of pain and drainage on presentation today. 6/5; using Hydrofera Blue. I gave her  doxycycline recently empirically for erythema and pain around the wound. Believe her cultures showed enterococcus which not would not have been well covered by doxycycline nevertheless the wound looks better and I don't feel specifically that the enterococcus needs to be covered. She has a new what looks like a wrap injury on her lateral left ankle. 6/12; she is using Hydrofera Blue. She has a new area on the left anterior lower tibial area. This was a wrap injury last week. 6/19; the patient is using Hydrofera Blue. She arrived with marked inflammation and erythema around the wound and tenderness. 12/01/18 on evaluation today patient appears to be doing a little bit better based on what I'm hearing from the standpoint of lassos evaluation to this as far as the overall appearance of the wound is concerned. Then sometime substandard she typically sees Dr. Dellia Nims. Nonetheless overall very pleased with the progress that she's made up to this point. No fevers, chills, nausea, or vomiting noted at this time. 7/10; some improvement in the surface area.  Aggressively debrided last week apparently. I went ahead with the debridement today although the patient does not tolerate this very well. We have been using Iodoflex. Still a fair amount of drainage 7/17; slightly smaller. Using Iodoflex. 7/24; no change from last week in terms of surface area. We have been using Iodoflex. Surface looks and continues to look somewhat better 7/31; surface area slightly smaller better looking surface. We have been using Iodoflex. This is under Unna boot compression 8/7-Patient presents at 1 week with Unna boot and Iodoflex, wound appears better 8/14-Patient presents at 1 week with Iodoflex, we use the Unna boot, wound appears to be stable better.Patient is getting Botox treatment for the inversion of the foot for tendon release, Next week 8/21; we are using Iodoflex. Unna boot. The wound is stable in terms of surface area. Under illumination there is some areas of the wound that appear to be either epithelialized or perhaps this is adherent slough at this point I was not really clear. It did not wipe off and I was reluctant to debride this today. 8/28; we are using Iodoflex in an Unna boot. Seems to be making good improvement. 9/4; using Iodoflex and wound is slightly smaller. 9/18; we are using Iodoflex with topical silver nitrate when she is here. The wound continues to be smaller 10/2; patient missed her appointment last week due to GI issues. She left and Iodoflex based dressing on for 2 weeks. Wound is about the same size about the size of a dime on the left medial lower 10/9 we have been using Iodoflex on the medial left ankle wound. She has a new superficial probable wrap injury on the dorsal left ankle 10/16; we have been using Hydrofera Blue since last week. This is on the left medial ankle 10/23; we have been using Hydrofera Blue since 2 weeks ago. This is on the left medial ankle. Dimensions are better 11/6; using Hydrofera Blue. I think the wound is  smaller but still not closed. Left medial ankle 11/13; we have been using Hydrofera Blue. Wound is certainly no smaller this week. Also the surface not as good. This is the remanent of a very large area on her left medial ankle. 11/20; using Sorbact since last week. Wound was about the same in terms of size although I was disappointed about the surface debris 12/11; 3-week follow-up. Patient was on vacation. Wound is measuring slightly larger we have been using Sorbact. 12/18; wound is  about the same size however surface looks better last week after debridement. We have been using Sorbact under compression 1/15 wound is probably twice the size of last time increased in length nonviable surface. We have been using Sorbact. She was running a mild fever and missed her appointment last week 1/22; the wound is come down in size but under illumination still a very adherent debris we have been Hydrofera Blue that I changed her to last week 1/29; dimensions down slightly. We have been using Hydrofera Blue 2/19 dimensions are the same however there is rims of epithelialization under illumination. Therefore more the surface area may be epithelialized 2/26; the patient's wound actually measures smaller. The wound looks healthy. We have been using Hydrofera Blue. I had some thoughts about running Apligraf then I still may do that however this looks so much better this week we will delay that for now 3/5; the wound is small but about the same as last week. We have been using Hydrofera Blue. No debridement is required today. 3/19; the wound is about the size of a dime. Healthy looking wound even under illumination. We have been using Hydrofera Blue. No mechanical debridement is necessary 3/26; not much change from last week although still looks very healthy. We have been using Hydrofera Blue under Unna boots Patient was offered an ankle fusion by podiatry but not until the wound heals with a proceed with  this. 4/9; the patient comes in today with her original wound on the medial ankle looking satisfactory however she has some uncontrolled swelling in the middle part of her leg with 2 new open areas superiorly just lateral to the tibia. I think this was probably a wrap issue. She said she felt uncomfortable during the week but did not call in. We have been using Hydrofera Blue 4/16; the wound on the medial ankle is about the same. She has innumerable small areas superior to this across her mid tibia. I think this is probably folliculitis. She is also been working in the yard doing a lot of sweating 4/30; the patient issue on the upper areas across her mid tibia of all healed. I think this was excessive yard work if I remember. Her wound on the medial ankle is smaller. Some debris on this we have been using Hydrofera Blue under Unna boots 5/7; mid tibia. She has been using Hydrofera Blue under an Unna wrap. She is apparently going for her ankle surgery on June 3 10/28/19-Patient returns to clinic with the ankle wound, we are using Hydrofera Blue under Unna wrap, surgery is scheduled for her left foot for June 3 so she will be back for nurse visit next week READMISSION 01/17/2020 Mrs. Agudelo is a 66 year old woman we have had in this clinic for a long period of time with severe venous hypertension and refractory wounds on her medial lower legs and ankles bilaterally. 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 When she was off her legs for a prolonged period example when she fell and suffered a compression fracture things would heal up quite nicely. She is now retired and we managed to heal up the right medial leg wound. The left one was very tiny last time I saw this although still refractory. 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 It got to the point where this was interfering with her walking and she elected to proceed with a ankle arthrodesis to straighten her her ankle and leave her with a functional outcome for mobilization. The patient was referred to Dr. Doren Custard and really this took some time to arrange. Dr. Doren Custard saw her on 12/07/2019. Once again he verified that she had no arterial issues. She had previously had an angiogram several years ago. Follow-up ABIs on the left showed an ABI of 1.12 with triphasic waveforms and a TBI of 0.92. She is felt to have chronic deep venous insufficiency but I do not think it was felt that anything could be done from about this from an ablation point of view. At the time Dr. Doren Custard saw this patient the wounds actually look closed via the pictures in his clinic. The patient finally underwent her surgery on 12/15/2019. This went reasonably well and there was a good anatomic outcome. She developed a small distal wound dehiscence on the lateral part of the surgical wound. However more problematically she is developed recurrence of the wound on the medial left ankle. There are actually 2 wounds here one in the distal lower leg and 1 pretty much at the level of the medial malleolus. It is a more distal area that is more problematic. She has been using Hydrofera Blue which started on Friday before this she was simply Ace wrapping. There was a culture done that showed Pseudomonas and she is on ciprofloxacin. A recent CNS on 8/11 was negative. The patient reports some pain but I generally think this is improving. She is using a cam boot completely nonweightbearing using a walker for pivot transfers and a wheelchair 8/24; not much improvement unfortunately she has a surgical wound on the lateral part in the venous insufficiency wound medially. The bottom part of the medial insufficiency wound is still necrotic there is exposed tendon here. We have been using  Hydrofera Blue under compression. Her edema control is however better 8/31; patient in for follow-up of his surgical wound on the lateral part of her left leg and chronic venous insufficiency ulcers medially. We put her back in compression last week. She comes in today with a complaint of 3 or 4 days worth of increasing pain. She felt her cam walker was rubbing on the area on the back of her heel. However there is intense erythema seems more likely she has cellulitis. She had 2 cultures done when she was seeing podiatry in the postop. One of them in late July showed Pseudomonas and she received a course of ciprofloxacin the other was negative on 8/11 she is allergic to penicillin with anaphylactoid complaints of hives oral swelling via information in epic 9/9; when I saw this patient last week she had intense anterior erythema around her wound on the right lateral heel and ankle and also into the right medial heel. Some of this was no doubt drainage and her walker boot however I was convinced she had cellulitis. I gave her Levaquin and Bactrim she is finishing up on this now. She is following up with Dr. [name] he saw her yesterday. He is taken her out of the walking boot of course she is still nonweightbearing. Her x-ray was negative for any worrisome features such as soft tissue air etc. Things are a lot better this week. She has home health. We have been using Hydrofera Blue under an [employer] which she put back on yesterday. I did not wrap  her last week 9/17; her surrounding skin looks a lot better. In fact the area on the left lateral ankle has just a scant amount of eschar. The only remaining wound is the large area on the left medial ankle. Probably about 60% of this is healthy granulation at the surface however she has a significant divot distally. This has adherent debris in it. I been using debridement and silver collagen to try and get this area to fill-in although I do not think we have made  much progress this week 9/24; the patient's wound on the left medial ankle looks a lot better. The deeper divot area distally still requires debridement but this is cleaning up quite nicely we have been using silver collagen. The patient is complaining of swelling in her foot and is worried that that is contributing to the nonhealing of the ankle wound. She is also complaining of numbness in her anterior toes 10/4; left medial ankle. The small area distally still has a divot with necrotic material that I have been debriding away. This has an undermining area. She is approved for Apligraf. She saw Dr. [name] her surgeon on 10/1. I think he declared himself is satisfied with the condition of things. Still nonweightbearing till the end of the month. 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 Her surgical wound is well closed. There is no evidence of infection 10/11; the patient arrived in clinic today with the expectation that we be able to put an Apligraf on this area after debridement however she arrives with a relatively large amount of green drainage on the dressing. The patient states that this started on Friday. She has not been systemically unwell. 10/19; culture I did last week showed both Enterococcus and Pseudomonas. I think this came in separate parts because I stopped her ciprofloxacin I gave her and prescribed her linezolid however now looking at the final culture result this is Pseudomonas which is resistant to quinolones. She has not yet picked up the linezolid apparently phone issues. We are also trying to get a topical antibiotic out of [place] in [place] they can be applied by home health. She is still having green drainage 10/16; the patient has her topical antibiotic from [facility] in [place]. This is a compounded gel with vancomycin and ciprofloxacin and gentamicin. We are applying this on the wound bed with silver alginate over the top  with Unna boot wraps. She arrives in clinic today with a lot less ominous looking drainage although she is only use this topical preparation once the second time today. She sees Dr. [name] her surgeon on Friday she has home health changing the dressing 11/2; still using her compounded topical antibiotic under silver alginate. Surface is cleaning up there is less drainage. We had an Apligraf for her today and I elected to apply it. A light coating of her antibiotic 04/25/2020 upon evaluation today patient appears to be doing well with regard to her ankle ulcer. There is a little bit of slough noted on the surface of the wound I am can have to perform sharp debridement to clear this away today. With that being said other than that fact overall I feel like she is making progress and we do see some new epithelial growth. There is also some improvement in the depth of the wound and that distal portion. There is little bit of slough there as well. 12/7; 2-week follow-up. Apligraf #3. Dimensions are smaller. Closing in especially inferiorly.  Still some surface debris. Still using the [facility] topical antibiotic but I told her that I don't think this needs to be renewed 12/21; 2-week follow-up. Apligraf #4 dimensions are smaller. Nice improvement 06/05/2020; 2-week follow-up. The patient's wound on the left medial ankle looks really excellent. Nice granulation. Advancing epithelialization no undermining no evidence of infection. We would have to reapply for another Apligraf but with the condition of this wound I did not feel strongly about it. We used Hydrofera Blue under the same degree of compression. She follows up with Dr. [name] her surgeon a week Friday 06/13/2020 upon evaluation today patient appears to be doing excellent in regard to her wound. She has been tolerating the dressing changes without complication. Fortunately there is no signs of active infection at this time. No fevers, chills, nausea,  vomiting, or diarrhea. She was using Hydrofera Blue last week. 06/20/2020 06/20/2020 on evaluation today patient actually appears to be doing excellent in regard to her wound. This is measuring better and looking much better as well. She has been using the collagen that seems to be doing better for her as well even though the [facility] was and is not sticking or feeling as rough on her wound. She did see Dr. [name] on Friday he is very pleased he also stated none of the hardware has shifted. That is great news 1/27; the patient has a small clean wound all that is remaining. I agree that this is too small to really consider an Apligraf. Under illumination the surface is looking quite good. We have been using collagen although the dimensions are not any better this week 2/2; the patient has a small clean wound on the left medial ankle. Although this left of her substantial original areas. Measurements are smaller. We have been using polymen Ag under an Haematologist. 2/10; small area on the left medial ankle. This looks clean nothing to debride however dimensions are about the same we have been using polymen I think now for 2 weeks 2/17; not much change in surface area. We have been using polymen Ag without any improvement. 3/17; 1 month follow-up. The patient has been using endoform without any improvement in fact I think this looks worse with more depth and more expansion 3/24; no improvement. Perhaps less debris on the surface. We have been using Sorbact for 1 week 4/4; wound measures larger. She has edema in her leg and her foot which she tells as her wrap came down. We have been using Unna boots. Sorbact of the wound. She has been approved for Apligraf 09/12/2020 upon evaluation today patient appears to be doing well with regard to her wound. We did get the Apligraf reapproved which is great news we have that available for application today. Fortunately there is no signs of infection and overall the  patient seems to be doing great. The wound bed is nice and clean. 4/27; patient presents for her second application of Apligraf. She states over the past week she has been on her feet more often due to being outside in her garden. She has noted more swelling to her foot as a result. She denies increased warmth, pain or erythema to the wound site. 10/10/2020 upon evaluation today patient appears to be doing well with regard to her wound which does not appear to be quite as irritated as last week from what I am hearing. With that being said unfortunately she is having issues with some erythema and warmth to touch as well as  an increase size. I do believe this likely is infected. 10/17/2020 upon evaluation today patient appears to be doing excellent in regard to her wound this is significantly improved as compared to last week. Fortunately I think that the infection is much better controlled at this point. She did have evidence of both Enterococcus as well as Staphylococcus noted on culture. Enterococcus really would not be helped significantly by the Cipro but the wound is doing so much better I am under the assumption that the Staphylococcus is probably the main organism that is causing the current infection. Nonetheless I think that she is doing excellent as far as that is concerned and I am very pleased in that regard. I would therefore recommend she continue with the Cipro. 10/31/2020 upon evaluation today patient appears to be doing well with regard to her wound. She has been tolerating the dressing changes without complication. Fortunately there is no signs of active infection and overall I am extremely pleased with where things stand today. No fevers, chills, nausea, vomiting, or diarrhea. With that being said she does have some green drainage coming from the wound and although it looks okay I am a little concerned about the possibility of a continuing infection. Specifically with Pseudomonas. For  that reason I will go ahead and send in a prescription for Cipro for her to be continued. 11/14/2020 upon evaluation today patient appears to be doing very well currently in regard to her wound on her leg. She has been tolerating the dressing changes without complication. Fortunately I feel like the infection is finally under good control here. Unfortunately we do not have the Apligraf for application today although we can definitely order to have it in place for next week. That will be her fifth and final of the current series. Nonetheless I feel like her wound is really doing quite well which is great news. 11/21/2020 upon evaluation today patient appears to be doing well with regard to her wound on the medial ankle. Fortunately I think the infection is under control and I do believe we can go ahead and reapply the Apligraf today. She is in agreement with that plan. There does not appear to be any signs of active infection at this time which is great news. No fevers, chills, nausea, vomiting, or diarrhea. 12/05/2020 upon evaluation today patient's wound bed actually showed signs of good granulation epithelization at this point. There does not appear to be any signs of infection which is great news and overall very pleased with where things stand. Overall the patient seems to be doing fairly well in my opinion with regard to her wound although I do believe she continues to build up a lot of biofilm I think she could benefit from using PuraPly at this point. 12/12/2020 upon evaluation today patient's wound actually appears to be doing decently well today. The Unna boot has not been quite as well-tolerated so that more uncomfortable for her and even causing some pressure over the plate on the lateral portion of her foot which is 90 where the wound is. There did not appear to be any significant deep tissue injury with that there may be a minimal change in the skin noted I think that we may want to go back to  the Coflex 2 layer which is a little bit easier on her skin it seems. 12/19/2020 upon evaluation today patient actually seems to be making great progress with the PuraPly currently. She in fact seems to be much better as  far as the overall appearance of the wound bed is concerned I am very happy in this regard. I do not see any signs of of infection which is great news as well. No fevers, chills, nausea, vomiting, or diarrhea. 12/26/2020 upon evaluation today patient appears to actually be doing better in regard to her wound on the left medial ankle region. The surface of the wound is actually doing significantly better which is great news. There does not appear to be any signs of infection which is also great news and in general I am extremely pleased with where we stand today. 01/02/2021 upon evaluation today patient appears to be doing well with regard to her wound. In fact this is showing signs of excellent improvement and very pleased with where things stand. In fact the last 3 appointments have all shown signs of this getting smaller which is excellent news. I have not even had to perform any debridement and today is no exception. Overall I feel like this is dramatically improved compared to previous. T oday is PuraPly application #4. 9/67/5916 upon evaluation today patient appears to be doing excellent in regard to her wound this is continue to show signs of improvement and overall I am extremely pleased with where we stand today. She is actually here for PuraPly application #5. Every time we have applied this she is noted definite improvement on measurements. 01/23/2021 upon evaluation today patient is actually making good progress in regard to her wound. This was actually on just a little bit longer this time compared to previous due to the fact that she did have to go out of town. She is actually here for PuraPly application #6. We have definitely been seeing improvements in the overall quality  of the tissue on the surface of the wound which is awesome news. In general I think that the patient seems to be continuing to make great progress here. 01/30/2021 upon evaluation today patient's wound is actually doing excellent. There is really not any significant biofilm buildup which is great news and overall I am extremely pleased with where things stand today. There does not appear to be any signs of active infection. No fevers, chills, nausea, vomiting, or diarrhea. 02/06/2021 upon evaluation today patient's wound is actually showing signs of excellent improvement which is great news. We continue to see the benefit of the PuraPly this is doing a great job the wound seems not really irritated whatsoever and is showing signs of good granulation at this point. Overall I am extremely happy with what we are seeing. The patient likewise is happy to hear all of this as well. 02/13/2021 upon evaluation today patient appears to be doing well with regard to her wound. This again is measuring smaller today and I am very pleased in this regard. Fortunately there does not appear to be any signs of active infection at this time which is good news from a systemic standpoint. Locally there is still some significant drainage which she does have is concerned about infection locally. No fevers, chills, nausea, vomiting, or diarrhea. 02/20/2021 upon evaluation today patient actually appears to be doing decently well in regard to her wound. She has been tolerating the dressing changes without complication. I do believe the PuraPly is helping wound bed does appear to be doing better. There is no evidence of active infection locally or systemically at this point visually although on fluorescence imaging there still appear to be bacterial load and bioburden noted. 02/27/2021 upon evaluation today patient fortunately  continues to show signs of improvement with use of the PuraPly currently. Subsequently we did review  her culture results and to be honest I think that the Bactrim is probably the best option to have her continue at this point. For that reason I am get a go ahead and send in a refill today for her for an additional 2 weeks. Nonetheless I think that we are making excellent progress here. It was Enterococcus and Staphylococcus that were noted she is allergic to penicillin so there is not much I can do from the Enterococcus standpoint though the staff does seem to be sensitive to the Bactrim. 03/06/2021 upon evaluation today patient appears to be doing well with regard to her wound. This is definitely showing signs of improvement which is great news and overall I am extremely pleased with where things stand today. There does not appear to be any signs of active infection which is great news and overall and I do believe that we are headed in the appropriate direction I think the PuraPly is doing an awesome job for her. 03/20/2021 upon evaluation today patient actually appears to be doing well with regard to her wound. We have been using the PuraPly although last week when I was out of the office they actually just used endoform nonetheless this still seems to be doing great. Fortunately there does not appear to be any signs of active infection which is great news overall I think the patient is making wonderful progress. 03/27/2021 upon evaluation today patient appears to be doing well with regard to her wound in fact there is a little drainage but otherwise her does not appear to be any major signs of open wounds nor infection at this time. Overall I am extremely pleased with where things stand. No fevers, chills, nausea, vomiting, or diarrhea. 04/03/2021 upon evaluation today patient presents for reevaluation here in the clinic and overall she seems to be doing quite well. Fortunately there does not appear to be any signs of active infection which is great news and in general I am very pleased with how  things appear. There is still a very small opening remaining but in general she is looking much better. Fortunately I do not think there is any need for the PuraPly at this point. 04/10/2021 upon evaluation today patient's wound is actually showing signs of excellent improvement. I am actually very pleased with where it stands today. I think she is very close to complete closure in fact this may be healed today. Either way I think that she may benefit from using her own compression socks for the next week and then will subsequently see how things stand following. 04/17/2021 upon evaluation today patient appears to be doing well with regard to her wound on the ankle region. This is actually showing signs of excellent improvement which is great news. I do not see any signs of active infection at this time which is great and no open wound. With that being said on the lateral portion of her ankle right at the base of her plate she actually has an area of pressure that occurred over the past week when she was not in the wrap or we been padding her. This does seem to be an issue which I think is good to be an ongoing problem I am can actually get in touch with Dr. [name] today to see what we can do in that regard. 04/24/2021 upon evaluation today patient appears to be doing well with  regard to her ankle region for the most part although there does appear to be some fluid trapped underneath the area where we thought this was healed last week. Unfortunately I think at this time that we can have to reopen and clean away some of this area and then subsequently depending on how things do over the next week or so we will see about discharge but right now I do not think that is the right way to go. She voiced understanding. 05/01/2021 upon evaluation today patient's wound still appears to be open unfortunately. I do believe that we do need to see what we can do about trying to get this closed I believe compression  wrapping is probably can to be indicated here. Fortunately there is no signs of active infection at this time. 05/08/2021 upon evaluation today patient appears to be doing okay in regard to her ulcer this is very superficial and we will see any major issues here. The biggest problem currently that I find is that we just cannot get this area to completely seal up. She seemed to almost be and then things digressed a little bit. Right now is not too bad but we do need to see if we can make some difference here. I think maybe switching over to collagen may be better. 05/15/2021 upon evaluation today patient actually appears to be doing pretty well in regard to her wound. This is measuring a little smaller and looking much better. She did see orthopedics today there to me making her a insert for her shoe to try to help straighten out her gait. I think this can be beneficial for her as well. 05/22/2021 upon evaluation today patient appears to be doing well currently in regard to her wound although is not significantly smaller unfortunately this at least does not show any signs of active infection at this time which is great news. No fevers, chills, nausea, vomiting, or diarrhea. I am going to see about switching over to endoform that is what we wanted to use in the past always been on backorder. 06/05/2021 upon evaluation today patient appears to be doing somewhat poorly in regard to her wound. This is really not showing the signs of improvement that I would have like to have seen. Fortunately I do not see any signs of active infection locally nor systemically which is great news. With that being said even though I do not see the sign she is having some increased discomfort which is not good. She is also having some issues here with the wound looking a little larger today compared to where its been. With that being said overall I believe that we probably need to consider at least that there may be a  subacute infection here and that is what is going on. We have tried multiple different medications and dressings which have done well for her in the past without seeing any signs of improvement. She is in agreement with addressing this as such. 06/12/2020 upon evaluation today patient's wound is actually showing some signs of improvement today. In fact I do not even see any need for sharp debridement based on what I see at this point today. Fortunately there is no evidence of active infection currently which is great news. No fevers, chills, nausea, vomiting, or diarrhea. 06/26/2021 upon evaluation today patient appears to be doing well with regard to her wounds. Fortunately there does not appear to be any signs of active infection at this time which is great news.  I do believe the Bactrim is doing well for her. Fortunately there does not appear to be any signs of active infection at this time. With the hardware in place and everything else I do believe extending this for 2 additional weeks for 4 weeks total would be advisable. 07/03/2021 upon evaluation today patient appears to be doing well with regard to her wound. She has been tolerating the dressing changes which is great news. Fortunately there does not appear to be any evidence of active infection locally nor systemically at this time. 07/10/2021 upon evaluation today patient appears to be doing well with regard to her wound I do feel that the PuraPly has done an awesome job for her over the past week. Fortunately I do not see any signs of active infection at this time. No fevers, chills, nausea, vomiting, or diarrhea. Electronic Signature(s) Signed: 07/10/2021 9:59:47 AM By: [name] PA-C Entered By: [name] on 07/10/2021 09:59:47 -------------------------------------------------------------------------------- Physical Exam Details Patient Name: Date of Service: [facility] [name], [name] 07/10/2021 8:30 A M Medical Record Number:  [id number] Patient Account Number: [id number] Date of Birth/Sex: Treating RN: 04-Oct-1955 (66 y.o. [name] Primary Care Provider: [name] Other Clinician: Referring Provider: Treating Provider/Extender: [name] in Treatment: 11 Constitutional Well-nourished and well-hydrated in no acute distress. Respiratory normal breathing without difficulty. Psychiatric this patient is able to make decisions and demonstrates good insight into disease process. Alert and Oriented x 3. pleasant and cooperative. Notes Upon inspection patient's wound is showing signs of some dry skin around the edges of the wound but actually has a lot of new skin covering over the central portion of the wound. Fortunately I do not see any signs of infection locally nor systemically at this point and overall I am extremely pleased with where we stand. Electronic Signature(s) Signed: 07/10/2021 10:00:07 AM By: [name] PA-C Entered By: [name] on 07/10/2021 10:00:07 -------------------------------------------------------------------------------- Physician Orders Details Patient Name: Date of Service: [facility] [name], [name] 07/10/2021 8:30 A M Medical Record Number: [id number] Patient Account Number: [id number] Date of Birth/Sex: Treating RN: [birth year] (66 y.o. 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 Margarita Grizzle Wednesday Room 3 Return A Cellular  or Tissue Based Products Cellular or Tissue Based Product Type: - Puraply AM #2 07/10/2021 Plan for #3 07/17/2021 Bathing/ Shower/ Hygiene May shower with protection but do not get wound dressing(s) wet. Edema Control - Lymphedema / SCD / Other Bilateral Lower Extremities Elevate legs to the level of the heart or above for 30 minutes daily and/or when sitting, a frequency of: - throughout the day when sitting Avoid standing for long periods of time. Patient to wear own compression stockings every day. Exercise regularly Moisturize legs daily. Non Wound Condition pply the following to affected area as directed: - Apply silicone bordered foam to left lateral foot for protection A Wound Treatment Wound #15R - Malleolus Wound Laterality: Left, Medial Cleanser: Wound Cleanser (Generic) 1 x Per Day/15 Days Discharge Instructions: Cleanse the wound with wound cleanser  prior to applying a clean dressing using gauze sponges, not tissue or cotton balls. Peri-Wound Care: Sween Lotion (Moisturizing lotion) 1 x Per Day/15 Days Discharge Instructions: Apply moisturizing lotion as directed Prim Dressing: Puraply AM 1 x Per MLY/65 Days ary Discharge Instructions: Provider applied to wound bed. Secondary Dressing: Woven Gauze Sponge, Non-Sterile 4x4 in (Generic) 1 x Per Day/15 Days Discharge Instructions: Apply over primary dressing as directed. Secondary Dressing: ABD Pad, 8x10 1 x Per Day/15 Days Discharge Instructions: Apply over primary dressing as directed. Secondary Dressing: ADAPTIC TOUCH 3x4.25 in 1 x Per Day/15 Days Discharge Instructions: Apply over primary dressing. Compression Wrap: CoFlex TLC XL 2-layer Compression System 4x7 (in/yd) 1 x Per Day/15 Days Discharge Instructions: Apply CoFlex 2-layer compression as directed. (alt for 4 layer) Electronic Signature(s) Signed: 07/10/2021 5:11:08 PM By: [name] PA-C Signed: 07/10/2021 5:26:44 PM By: Deon Pilling RN, BSN Entered By:  Deon Pilling on 07/10/2021 09:21:13 -------------------------------------------------------------------------------- Problem List Details Patient Name: Date of Service: [name], [name] 07/10/2021 8:30 A M Medical Record Number: [id number] Patient Account Number: [id number] Date of Birth/Sex: Treating RN: Sep 05, 1955 (66 y.o. 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 07/10/2021 8:30 A M Medical Record Number: [id number] Patient Account Number: [id number] Date of Birth/Sex: Treating RN: 08-31-1955 (66 y.o. 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 01/17/2020; this is a patient who is here for review of postoperative wounds on the left lateral ankle and recurrence of venous stasis ulceration on the left medial History of Present Illness (HPI) the remaining wound is over the left medial ankle. Similar wound over the right medial ankle healed largely with use of Apligraf. Most recently we have been using Hydrofera Blue over this wound with considerable improvement. The patient has been extensively worked up in the past for her venous insufficiency and she is not a candidate for antireflux surgery although I have none of the details available currently. 08/24/14; considerable improvement today. About 50% of this wound areas now epithelialized. The base of the wound appears to be healthier granulation.as opposed to last week when she had deteriorated a considerable improvement 08/17/14; unfortunately the wound has regressed somewhat. The areas of epithelialization from the superior aspect are not nearly as healthy as they were last week. The patient thinks her Hydrofera Blue slipped. 09/07/14; unfortunately the area has markedly regressed in the 2 weeks since I've seen this. There is  an odor surrounding erythema. The healthy granulation tissue that we had at the base of the wound now is a dusky color. The nurse reports green drainage 09/14/14; the area looks somewhat better than last week. There is less erythema and less drainage. The culture I did did not show any growth. Nevertheless I think it is better to continue the Cipro and doxycycline for a further week. The remaining wound area was debridement. 09/21/14. Wound did not require debridement last week. Still less erythema and less drainage. She can complete her antibiotics. The areas of epithelialization in the superior aspect of the wound do not look as healthy as they did some weeks ago 10/05/14  continued improvement in the condition of this wound. There is advancing epithelialization. Less aggressive debridement required 10/19/14 continued improvement in the condition and volume of this wound. Less aggressive debridement to the inferior part of this to remove surface slough and fibrinous eschar 11/02/14 no debridement is required. The surface granulation appears healthy although some of her islands of epithelialization seem to have regressed. No evidence of infection 11/16/14; lites surface debridement done of surface eschar. The wound does not look to be unhealthy. No evidence of infection. Unfortunately the patient has had podiatry issues in the right foot and for some reason has redeveloped small surface ulcerations in the medial right ankle. Her original presentation involved wounds in this area 11/23/14 no debridement. The area on the right ankle has enlarged. The left ankle wound appears stable in terms of the surface although there is periwound inflammation. There has been regression in the amount of new skin 11/30/14 no debridement. Both wound areas appear healthy. There was no evidence of infection. The the new area on the right medial ankle has enlarged although that both the surfaces appear to be stable. 12/07/14; Debridement of the right medial ankle wound. No no debridement was done on the left. 12/14/14 no major change in and now bilateral medial ankle wounds. Both of these are very painful but the no overt evidence of infection. She has had previous venous ablation 12/21/14; patient states that her right medial ankle wound is considerably more painful last week than usual. Her left is also somewhat painful. She could not tolerate debridement. The right medial ankle wound has fibrinous surface eschar 12/28/14 this is a patient with severe bilateral venous insufficiency ulcers. For a considerable period of time we actually had the one on the right medial ankle healed however this  recently opened up again in June. The left medial ankle wound has been a refractory area with some absent flows. We had some success with Hydrofera Blue on this area and it literally closed by 50% however it is recently opened up Foley. Both of these were debridement today of surface eschar. She tolerates this poorly 01/25/15: No change in the status of this. Thick adherent escar. Very poor tolerance of any attempt at debridement. I had healed the right medial malleolus wound for a considerable amount of time and had the left one down to about 50% of the volume although this is totally regressed over the last 48 weeks. Further the right leg has reopened. she is trying to make a appointment with pain and vascular, previous ablations with Dr. Aleda Grana. I do not believe there is an arterial insufficiency issue here 02/01/15 the status of the adherent eschar bilaterally is actually improved. No debridement was done. She did not manage to get vascular studies done 02/08/15 continued debridement of the area was done today.  The slough is less adherent and comes off with less pressure. There is no surrounding infection peripheral pulses are intact 02/15/15 selective debridement with a disposable curette. Again the slough is less adherent and comes off with less difficulty. No surrounding infection peripheral pulses are intact. 02/22/15 selective debridement of the right medial ankle wound. Slough comes off with less difficulty. No obvious surrounding infection peripheral pulses are intact I did not debridement the one on the left. Both of these are stable to improved 03/01/15 selective debridement of both wound areas using a curette to. Adherent slough cup soft with less difficulty. No obvious surrounding infection. The patient tells me that 2 days ago she noted a rash above the right leg wrap. She did not have this on her lower legs when she change this over she arrives with widespread left greater than right  almost folliculitis-looking rash which is extremely pruritic. I don't see anything to culture here. There is no rash on the rest of her body. She feels well systemically. 03/08/15; selective debridement of both wounds using a curette. Base of this does not look unhealthy. She had limegreen drainage coming out of the left leg wound and describes a lot of drainage. The rash on her left leg looks improved to. No cultures were done. 03/22/15; patient was not here last week. Basal wounds does not look healthy and there is no surrounding erythema. No drainage. There is still a rash on the left leg that almost looks vasculitic however it is clearly limited to the top of where the wrap would be. 04/05/15; on the right required a surgical debridement of surface eschar and necrotic subcutaneous tissue. I did not debridement the area on the left. These continue to be large open wounds that are not changing that much. We were successful at one point in healing the area on the right, and at the same time the area on the left was roughly half the size of current measurements. I think a lot of the deterioration has to do with the prolonged time the patient is on her feet at work 04/19/15 I attempted-like surface debridement bilaterally she does not tolerate this. She tells me that she was in allergic care yesterday with extreme pain over her left lateral malleolus/ankle and was told that she has an "sprain" 05/03/15; large bilateral venous insufficiency wounds over the medial malleolus/medial aspect of her ankles. She complains of copious amounts of drainage and his usual large amounts of pain. There is some increasing erythema around the wound on the right extending into the medial aspect of her foot to. historically she came in with these wounds the right one healed and the left one came down to roughly half its current size however the right one is reopened and the left is expanded. 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 05/10/15 large bilateral venous insufficiency wounds. There is less adherence surface left however the surface culture that I did last week grew pseudomonas therefore bilateral selective debridement score necessary. There is surrounding erythema. The patient describes severe bilateral drainage and a lot of pain in the left ankle. Apparently her podiatrist was were ready to do a cortisone shot 05/17/15; the patient complains of pain and again copious amounts of drainage. 05/24/15; we used Iodo flex last week. Patient notes considerable improvement in wound drainage. Only needed to change this once. 05/31/15; we continued Iodoflex; the base of these large  wounds bilaterally is not too bad but there is probably likely a significant bioburden here. I would like to debridement just doesn't tolerate it. 06/06/14 I would like to continue the Iodoflex although she still hasn't managed to obtain supplies. She has bilateral medial malleoli or large wounds which are mostly superficial. Both of them are covered circumferentially with some nonviable fibrinous slough although she tolerates debridement very poorly. She apparently has an appointment for an ablation on the right leg by interventional radiology. 06/14/15; the patient arrives with the wounds and static condition. We attempted a debridement although she does not do well with this secondary to pain. I 07/05/15; wounds are not much smaller however there appears to be a cleaner granulating base. The left has tight fibrinous slough greater than the right. Debridement is tolerated poorly due to pain. Iodoflex is done more for these wounds in any of the multitude of different dressings I have tried on the left 1 and then subsequently the right. 07/12/15; no change in the condition of this wound. I am able to do an aggressive debridement on the right but not the left. She simply cannot tolerate it. We have  been using Iodoflex which helps somewhat. It is worthwhile remembering that at one point we healed the right medial ankle wound and the left was about 25% of the current circumference. We have suggested returning to vascular surgery for review of possible further ablations for one reason or another she has not been able to do this. 07/26/15 no major change in the condition of either wound on her medial ankle. I did not attempt to debridement of these. She has been aggressively scrubbing these while she is in the shower at home. She has her supply of Iodoflex which seems to have done more for these wounds then anything I have put on recently. 08/09/15 wound area appears larger although not verified by measurements. Using Iodoflex 09/05/2015 -- she was here for avisit today but had significant problems with the wound and I was asked to see her for a physician opinion. I have summarize that this lady has had surgery on her left lower extremity about 10 years ago where the possible veins stripping was done. She has had an opinion from interventional radiology around November 2016 where no further sclerotherapy was ordered. The patient works 12 hours a day and stands on a concrete floor with work boots and is unable to get the proper compression she requires and cannot elevate her limbs appropriately at any given time. She has recently grown Pseudomonas from her wound culture but has not started her ciprofloxacin which was called in for her. 09/13/15 this continues to be a difficult situation for this patient. At one point I had this wound down to a 1.5 x 1.5" wound on her left leg. This is deteriorated and the right leg has reopened. She now has substantial wounds on her medial calcaneus, malleoli and into her lower leg. One on the left has surface eschar but these are far too painful for me to debridement here. She has a vascular surgery appointment next week to see if anything can be done to help here. I  think she has had previous ablations several years ago at [facility]. She has no major edema. She tells me that she did not get product last time [facility]] and went for several days without it. She continues to work in work boots 12 hours a day. She cannot get compression/4-layer under her work boots.  09/20/15 no major change. Periwound edema control was not very good. Her point with pain and vascular is next Wednesday the 25th 09/28/15; the patient is seen vascular surgery and is apparently scheduled for repeat duplex ultrasounds of her bilateral lower legs next week. 10/05/15; the patient was seen by Dr. Doren Custard of vascular surgery. He feels that she should have arterial insufficiency excluded as cause/contributed to her nonhealing stage she is therefore booked for an arteriogram. She has apparently monophasic signals in the dorsalis pedis pulses. She also of course has known severe chronic venous insufficiency with previous procedures as noted previously. I had another long discussion with the patient today about her continuing to work 12 hour shifts. I've written her out for 2 months area had concerns about this as her work location is currently undergoing significant turmoil and this may lead to her termination. She is aware of this however I agree with her that she simply cannot continue to stand for 12 hours multiple days a week with the substantial wound areas she has. 10/19/15; the Dr. Doren Custard appointment was largely for an arteriogram which was normal. She does not have an arterial issue. He didn't make a comment about her chronic venous insufficiency for which she has had previous ablations. Presumably it was not felt that anything additional could be done. The patient is now out of work as I prescribed 2 weeks ago. Her wounds look somewhat less aggravated presumably because of this. I felt I would give debridement another try today 10/25/15; no major change in this patient's wounds. We are  struggling to get her product that she can afford into her own home through her insurance. 11/01/15; no major change in the patient's wounds. I have been using silver alginate as the most affordable product. I spoke to Dr. Marla Roe last week with her requested take her to the OR for surgical debridement and placement of ACEL. Dr. Marla Roe told me that she would be willing to do this however [facility] will not cover this, fortunately the patient has [place] healthcare of some variant 11/08/15; no major change in the patient's wounds. She has been completely nonviable surface that this but is in too much pain with any attempted debridement are clinic. I have arranged for her to see Dr. Marla Roe ham of plastic surgery and this appointment is on Monday. I am hopeful that they will take her to the OR for debridement, possible ACEL ultimately possible skin graft 11/22/15 no major change in the patient's wounds over her bilateral medial calcaneus medial malleolus into the lower legs. Surface on these does not look too bad however on the left there is surrounding erythema and tenderness. This may be cellulitis or could him sleepy tinea. 11/29/15; no major changes in the patient's wounds over her bilateral medial malleolus. There is no infection here and I don't think any additional antibiotics are necessary. There is now plan to move forward. She sees Dr. Marla Roe in a week's time for preparation for operative debridement and ACEL placement I believe on 7/12. She then has a follow-up appointment with Dr. Marla Roe on 7/21 12/28/15; the patient returns today having been taken to the [place] by Dr. Marla Roe 12/12/15 she underwent debridement, intraoperative cultures [which were negative]. She had placement of a wound VAC. Parent really ACEL was not available to be placed. The wound VAC foam apparently adhered to the wound since then she's been using silver alginate, Xeroform under Ace wraps. She still  says there is a  lot of drainage and a lot of pain 01/31/16; this is a patient I see monthly. I had referred her to Dr. Marla Roe him of plastic surgery for large wounds on her bilateral medial ankles. She has been to the OR twice once in early July and once in early August. She tells me over the last 3 weeks she has been using the wound VAC with ACEL underneath it. On the right we've simply been using silver alginate. Under Kerlix Coban wraps. 02/28/16; this is a patient I'm currently seeing monthly. She is gone on to have a skin graft over her large venous insufficiency ulcer on the left medial ankle. This was done by Dr. Marla Roe him. The patient is a bit perturbed about why she didn't have one on her right medial ankle wound. She has been using silver alginate to this. 03/06/16; I received a phone call from her plastic surgery Dr. Marla Roe. She expressed some concern about the viability of the skin graft she did on the left medial ankle wound. Asked me to place Endoform on this. She told me she is not planning to do a subsequent skin graft on the right as the left one did not take very well. I had placed Hydrofera Blue on the right 03/13/16; continue to have a reasonably healthy wound on the right medial ankle. Down to 3 mm in terms of size. There is epithelialization here. The area on the left medial ankle is her skin graft site. I suppose the last week this looks somewhat better. She has an open area inferiorly however in the center there appears to be some viable tissue. There is a lot of surface callus and eschar that will eventually need to come off however none of this looked to be infected. Patient states that the is able to keep the dressing on for several days which is an improvement. 03/20/16 no major change in the circumference of either wound however on the left side the patient was at Dr. Eusebio Friendly office and they did a debridement of left wound. 50% of the wound seems to be  epithelialized. I been using Endoform on the left Hydrofera Blue in the right 03/27/16; she arrives today with her wound is not looking as healthy as they did last week. The area on the right clearly has an adherent surface to this a very similar surface on the left. Unfortunately for this patient this is all too familiar problem. Clearly the Endoform is not working and will need to change that today that has some potential to help this surface. She does not tolerate debridement in this clinic very well. She is changing the dressing wants 04/03/16; patient arrives with the wounds looking somewhat better especially on the right. Dr. Migdalia Dk change the dressing to silver alginate when she saw her on Monday and also sold her some compression socks. The usefulness of the latter is really not clear and woman with severely draining wounds. 04/10/16; the patient is doing a bit of an experiment wearing the compression stockings that Dr. Migdalia Dk provided her to her left leg and the out of legs based dressings that we provided to the right. 05/01/16; the patient is continuing to wear compression stockings Dr. Migdalia Dk provided her on the left that are apparently silver impregnated. She has been using Iodoflex to the right leg wound. Still a moderate amount of drainage, when she leaves here the wraps only last for 4 days. She has to change the stocking on the left leg every night  05/15/16; she is now using compression stockings bilaterally provided by Dr. Marla Roe. She is wearing a nonadherent layer over the wounds so really I don't think there is anything specific being done to this now. She has some reduction on the left wound. The right is stable. I think all healing here is being done without a specific dressing 06/09/16; patient arrives here today with not much change in the wound certainly in diameter to large circular wounds over the medial aspect of her ankle bilaterally. Under the light of these services are  certainly not viable for healing. There is no evidence of surrounding infection. She is wearing compression stockings with some sort of silver impregnation as prescribed by Dr. Marla Roe. She has a follow-up with her tomorrow. 06/30/16; no major change in the size or condition of her wounds. These are still probably covered with a nonviable surface. She is using only her purchase stockings. She did see Dr. Marla Roe who seemed to want to apply Dakin's solution to this I'm not extreme short what value this would be. I would suggest Iodoflex which she still has at home. 07/28/16; I follow Mrs. Lona episodically along with Dr. Marla Roe. 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 She has been applying a topical collagen ointment to both wounds with Adaptic. I don't think Dr. Marla Roe is planning to take her back to the OR. 08/19/16; I follow Mrs. Jeneen Rinks on a monthly basis along with Dr. Marla Roe of plastic surgery. 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 I been following her for a number of years. At one point I was able to get the right medial malleolus wound to heal and had the left medial malleolus down to about half its current size however and I had to send her to plastic surgery for an operative debridement. Since then things have been stable to slightly improve the area on the right is slightly better one in the left about the same although there is much less adherent surface than I'm used to with this patient. She is using some form of liquid collagen gel that Dr. Marla Roe provided a Kerlix cover with the patient's own pressure stockings. She tells me that she has extreme pain in both ankles and along the lateral aspect of both feet. She has been unable to work for some period of time. She is telling me she is retiring at the beginning of April. She sees Dr.  Doran Durand of orthopedics next week 09/22/16; patient has not seen Dr. Marla Roe since the last time she is here. I'm not really sure what she is using to the wounds other than bits and pieces of think she had left over including most recently Hydrofera Blue. She is using juxtalite stockings. She is having difficulty with her husband's recent illness "stroke". She is having to transport him to various doctors appointments. Dr. Marla Roe left her the option of a repeat debridement with ACEL however she has not been able to get the time to follow-up on this. She continues to have a fair amount of drainage out of these wounds with certainly precludes leaving dressings on all week 10/13/16; patient has not seen Dr. Marla Roe since she was last in our clinic. I'm not really sure what she is doing with the wounds, we did try to get her [facility] and I think she is actually using this most of the time. Because of drainage  she states she has to change this every second day although this is an improvement from what she used to do. She went to see Dr. Doran Durand who did not think she had a muscular issue with regards to her feet, he referred her to a neurologist and I think the appointment is sometime in June. I changed her back to Iodoflex which she has used in the past but not recently. 11/03/16; the patient has been using Iodoflex although she ran out of this. Still claims that there is a lot of drainage although the wound does not look like this. No surrounding erythema. She has not been back to see Dr. Marla Roe 11/24/16; the patient has been using Iodoflex again but she ran out of it 2 or 3 days ago. There is no major change in the condition of either one of these wounds in fact they are larger and covered in a thick adherent surface slough/nonviable tissue especially on the left. She does not tolerate mechanical debridement in our clinic. Going back to see Dr. Marla Roe of plastic surgery for an operative  debridement would seem reasonable. 12/15/16; the patient has not been back to see Dr. Marla Roe. She is been dealing with a series of illnesses and her husband which of monopolized her time. She is been using Sorbact which we largely supplied. She states the drainage is bad enough that it maximum she can go 2-3 days without changing the dressing 01/12/2017 -- the patient has not been back for about 4 weeks and has not seen Dr. Marla Roe not does she have any appointment pending. 01/23/17; patient has not seen Dr. Marla Roe even though I suggested this previously. She is using Santyl that was suggested last week by Dr. Con Memos this Cost her $16 through her insurance which is indeed surprising 02/12/17; continuing Santyl and the patient is changing this daily. A lot of drainage. She has not been back to see plastic surgery she is using an Ace wrap. Our intake nurse suggested wrap around stockings which would make a good reasonable alternative 02/26/17; patient is been using Santyl and changing this daily due to drainage. She has not been to see plastic surgery she uses in April Ace wrap to control the edema. She did obtain extremitease stockings but stated that the edema in her leg was to big for these 03/20/17; patient is using Santyl and Anasept. Surfaces looked better today the area on the right is actually measuring a little smaller. She has states she has a lot of pain in her feet and ankles and is asking for a consult to pain control which I'll try to help her with through our case manager. 04/10/17; the patient arrives with better-looking wound surfaces and is slightly smaller wound on the left she is using a combination of Santyl and Anasept. She has an appointment or at least as started in the pain control center associated with [facility] 05/14/17; this is a patient who I followed for a prolonged period of time. She has venous insufficiency ulcers on her bilateral medial ankles. At one  point I had this down to a much smaller wound on the left however these reopened and we've never been able to get these to heal. She has been using Santyl and Anasept gel although 2 weeks ago she ran out of the Anasept gel. She has a stable appearance of the wound. She is going to the wound care clinic at [facility]. They wanted do a nerve block/spinal block although she tells  me she is reluctant to go forward with that. 05/21/17; this is a patient I have followed for many years. She has venous insufficiency ulcers on her bilateral medial ankles. Chronic pain and deformity in her ankles as well. She is been to see plastic surgery as well as orthopedics. Using PolyMem AG most recently/Kerramax/ABDs and 2 layer compression. She has managed to keep this on and she is coming in for a nurse check to change the dressing on Tuesdays, we see her on Fridays 06/05/17; really quite a good looking surface and the area especially on the right medial has contracted in terms of dimensions. Well granulated healthy-looking tissue on both sides. Even with an open curet there is nothing that even feels abnormal here. This is as good as I've seen this in quite some time. We have been using PolyMem AG and bringing her in for a nurse check 06/12/17; really quite good surface on both of these wounds. The right medial has contracted a bit left is not. We've been using PolyMem and AG and she is coming in for a nurse visit 06/19/17; we have been using PolyMem AG and bringing her in for a nurse check. Dimensions of her wounds are not better but the surfaces looked better bilaterally. She complained of bleeding last night and the left wound and increasing pain bilaterally. She states her wound pain is more neuropathic than just the wounds. There was some suggestion that this was radicular from her pain management doctor in talking to her it is really difficult to sort this out. 06/26/17; using PolyMem and AG and bringing her  in for a nurse check as All of this and reasonably stable condition. Certainly not improved. The dimensions on the lateral part of the right leg look better but not really measuring better. The medial aspect on the left is about the same. 07/03/16; we have been using PolyMen AG and bringing her in for a nurse check to change the dressings as the wounds have drainage which precludes once weekly changing. We are using all secondary absorptive dressings.our intake nurse is brought up the idea of using a wound VAC/snap VAC on the wound to help with the drainage to see if this would result in some contraction. This is not a bad idea. The area on the right medial is actually looking smaller. Both wounds have a reasonable-looking surface. There is no evidence of cellulitis. The edema is well controlled 07/10/17; the patient was denied for a snap VAC by her insurance. The major issue with these wounds continues to be drainage. We are using wicked PolyMem AG and she is coming in for a nurse visit to change this. The wounds are stable to slightly improved. The surface looks vibrant and the area on the right certainly has shrunk in size but very slowly 07/17/17; the patient still has large wounds on her bilateral medial malleoli. Surface of both of these wounds looks better. The dimensions seem to come and go but no consistent improvement. There is no epithelialization. We do not have options for advanced treatment products due to insurance issues. They did not approve of the wound VAC to help control the drainage. More recently we've been using PolyMem and AG wicked to allow drainage through. We have been bringing her in for a nurse visit to change this. We do not have a lot of options for wound care products and the home again due to insurance issues 07/24/17; the patient's wound actually looks somewhat better today. No  drainage measurements are smaller still healthy-looking surface. We used silver collagen under  PolyMen started last week. We have been bringing her in for a dressing change 07/31/17; patient's wound surface continued to look better and I think there is visible change in the dimensions of the wound on the right. Rims of epithelialization. We have been using silver collagen under PolyMen and bringing her in for a dressing change. There appears to be less drainage although she is still in need of the dressing change 08/07/17. Patient's wound surface continues to look better on both sides and the area on the right is definitely smaller. We have been using silver collagen and PolyMen. She feels that the drainage has been it has been better. I asked her about her vascular status. She went to see Dr. Aleda Grana at [facility] and had some form of ablation. I don't have much detail on this. I haven't my notes from 2016 that she was not a candidate for any further ablation but I don't have any more information on this. We had referred her to vein and vascular I don't think she ever went. He does not have a history of PAD although I don't have any information on this either. We don't even have ABIs in our record 08/14/17; we've been using silver collagen and PolyMen cover. And putting the patient and compression. She we are bringing her in as a nurse visit to change this because ofarge amount of drainage. We didn't the ABIs in clinic today since they had been done in many moons 1.2 bilaterally. She has been to see vein and vascular however this was at [facility] and she had ablation although I really don't have any information on this all seemed biking get a report. She is also been operatively debrided by plastic surgery and had a cell placed probably 8-12 months ago. This didn't have a major effect. We've been making some gains with current dressings 08/19/17-She is here in follow-up evaluation for bilateral medial malleoli ulcers. She continues to tolerate debridement very poorly. We will continue  with recently changed topical treatment; if no significant improvement may consider switching to Iodosorb/Iodoflex. 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 She has been using silver collagen and PolyMem. I believe she has been used and tried on Iodoflex before. 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 These have been very refractory over time. We have been using silver collagen and PolyMen. She is coming in once a week for a doctor's and once a week for nurse visits. We are actually making some progress 09/18/17; the patient's wounds are smaller especially on the right medial. She arrives today to upset to consider even washing these off with Anasept which I think is been part of the reason this is been closing. We've been using collagen covered in PolyMen otherwise. It is noted that she has a small area of folliculitis on the right medial calf that. As we are wrapping her legs I'll give her a short course of doxycycline to make sure this doesn't amount to anything. She is a long list of complaints today including imbalance, shortness of breath on exertion, inversion of her left ankle. With regards to the latter complaints she is been to see orthopedics and they offered her a tendon  release surgery I believe but wanted her wounds to be closed first. I have recommended she go see her primary physician with regards to everything else. 09/25/17; patient's wounds are about the same size. We have made some progress bilaterally although not in recent weeks. She will not allow me T wash these wounds with Anasept even if she is doing her cell. Wheeze we've been using collagen covered in PolyMen. Last week she had a small area of folliculitis this is now opened into a  small wound. She completed 5 days of trimethoprim sulfamethoxazole 10/02/17; unfortunately the area on her left medial ankle is worse with a larger wound area towards the Achilles. The patient complains of a lot of pain. She will not allow debridement although visually I don't think there is anything to debridement in any case. We have been using silver collagen and PolyMen for several months now. Initially we are making some progress although I'm not really seeing that today. We will move back to [facility]. His admittedly this is a bit of a repeat however I'm hoping that his situation is different now. The patient tells me she had her leg on the left give out on her yesterday this is process some pain. 10/09/17; the patient is seen twice a week largely because of drainage issues coming out of the chronic medial bimalleolar wounds that are chronic. Last week the dimensions of the one on the left looks a little larger I changed her to [facility]. She comes in today with a history of terrible pain in the bilateral wound areas. She will not allow debridement. She will not even allow a tissue culture. There is no surrounding erythema no no evidence of cellulitis. We have been putting her Kerlix Coban man. She will not allow more aggressive compression as there was a suggestion to put her in 3 layer wraps. 10/16/17; large wounds on her bilateral medial malleoli. These are chronic. Not much change from last week. The surface looks have healthy but absolutely no epithelialization. A lot of pain little less so of drainage. She will not allow debridement or even washing these off in the vigorous fashion with Anasept. 10/23/17; large wounds on her bilateral malleoli which are chronic. Some improvement in terms of size perhaps on the right since last time I saw these. She states that after we increased the 3 layer compression there was some bleeding, when she came in for a nurse visit she did not want 3 layer  compression put back on about our nurse managed to convince her. She has known chronic venous visit issues and I'm hoping to get her to tolerate the 3 layer compression. using Hydrofera Blue 10/30/17; absolutely no change in the condition of either wound although we've had some improvement in dimensions on the right.. Attempted to put her in 3 layer compression she didn't tolerated she is back in 2 layer compression. We've been using Hydrofera Blue We looked over her past records. She had venous reflux studies in November 2016. There was no evidence of deep venous reflux on the right. Superficial vein did not show the greater saphenous vein at think this is been previously ablated the small saphenous vein was within normal limits. The left deep venous system showed no DVT the vessels were positive for deep venous reflux in the posterior tibial veins at the ankle. The greater saphenous vein was surgically absent small saphenous vein was within normal limits. She went to vein and vascular at [facility]. I  believe she had an ablation on the left greater saphenous vein. I'll update her reflux studies perhaps ever reviewed by vein and vascular. We've made absolutely no progress in these wounds. Will also try to read and TheraSkins through her insurance 11/06/17; W the patient apparently has a 2 week follow-up with vein and vascular I like him to review the whole issue with regards to her previous vascular workup by Dr. Aleda Grana. We've really made no progress on these wounds in many months. She arrives today with less viable looking surface on the left medial ankle wound. This was apparently looking about the same on Tuesday when she was here for nurse visit. 11/13/17; deep tissue culture I did last time of the left lower leg showed multiple organisms without any predominating. In particular no Staphylococcus or group A strep were isolated. We sent her for venous reflux studies. She's had a previous  left greater saphenous vein stripping and I think sclerotherapy of the right greater saphenous vein. She didn't really look at the lesser saphenous vein this both wounds are on the medial aspect. She has reflux in the common femoral vein and popliteal vein and an accessory vein on the right and the common femoral vein and popliteal vein on the left. I'm going to have her go to see vein and vascular just the look over things and see if anything else beside aggressive compression is indicated here. We have not been able to make any progress on these wounds in spite of the fact that the surface of the wounds is never look too bad. 11/20/17; no major change in the condition of the wounds. Patient reports a large amount of drainage. She has a lot of complaints of pain although enlisting her today I wonder if some of this at least his neuropathic rather than secondary to her wounds. She has an appointment with vein and vascular on 12/30/17. The refractory nature of these wounds in my mind at least need vein and vascular to look over the wounds the recent reflux studies we did and her history to see if anything further can be done here. I also note her gait is deteriorated quite a bit. 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 She has a bilateral Trendelenburg gait. I wonder if this is neuropathic or perhaps multilevel radicular. 11/27/17; her wounds actually looks slightly better. Healthy-looking granulation tissue a scant amount of epithelialization. [place] healthcare will not pay for [employer]. They will play for tri layer Oasis and Dermagraft. This is not a diabetic ulcer. We'll try for the tri layer Oasis. She still complains of some drainage. She has a vein and vascular appointment on 12/30/17 12/04/17; the wounds visually look quite good. Healthy-looking granulation with some degree of epithelialization. We are still waiting for response to our request for trial to try layer Oasis. Her  appointment with vascular to review venous and arterial issues isn't sold the end of July 7/31. Not allow debridement or even vigorous cleansing of the wound surface. 12/18/17; slightly smaller especially on the right. 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 We've been using Hydrofera Blue. We still are looking into triple layer Oasis through her insurance 01/08/18 on evaluation today patient's wound actually appears to be showing signs of good improvement at this point in time. She has been tolerating the dressing changes without complication. Fortunately there does not appear to be any evidence of infection at this point in time. We have been utilizing silver nitrate  which does seem to be of benefit for her which is also good news. Overall I'm very happy with how things seem to be both regards appearance as well as measurement. Patient did see Dr. Bridgett Larsson for evaluation on 12/30/17. In his assessment he felt that stripping would not likely add much more than chronic compression to the patient's healing process. His recommendation was to follow-up in three months with Dr. Doren Custard if she hasn't healed in order to consider referral back to you and see vascular where she previously was in a trial and was able to get her wound to heal. I'll be see what she feels she when you staying compression and he reiterated this as well. 01/13/18 on evaluation today patient appears to actually be doing very well in regard to her bilateral medial malleolus ulcers. She seems to have tolerated the chemical cauterization with silver nitrate last week she did have some pain through that evening but fortunately states that I'll be see since it seems to be doing better she is overall pleased with the progress. 01/21/18; really quite a remarkable improvement since I've last seen these wounds. We started using silver nitrate specially on the islands of hyper granulation which for some reason her around  the wound circumference. This is really done quite nicely. Primary dressing Hydrofera Blue under 4 layer compression. She seems to be able to hold out without a nurse rewrap. Follow-up in 1 week 01/28/18; we've continued the hydrofera blue but continued with chemical cauterization to the wound area that we started about a month ago for irregular hyper granulation. She is made almost stunning improvement in the overall wound dimensions. I was not really expecting this degree of improvement in these chronic wounds 02/05/18; we continue with Hydrofera Bluebut of also continued the aggressive chemical cauterization with silver nitrate. We made nice progress with the right greater than left wound. 02/12/18. We continued with Hydrofera Blue after aggressive chemical cauterization with silver nitrate. We appear to be making nice progress with both wound areas 02/19/2018; we continue with [facility] after washing the wounds vigorously with Anasept spray and chemical cauterization with silver nitrate. We are making excellent progress. The area on the right's just about closed 02/26/2018. The area on the left medial ankle had too much necrotic debris today. I used a #5 curette we are able to get most of the soft. I continued with the silver nitrate to the much smaller wound on the right medial ankle she had a new area on her right lower pretibial area which she says was due to a role in her compression 03/05/2018; both wound areas look healthy. Not much change in dimensions from last week. I continue to use silver nitrate and Hydrofera Blue. The patient saw Dr. Doren Custard of vein and vascular. He felt she had venous stasis ulcers. He felt based on her previous arteriogram she should have adequate circulation for healing. Also she has deep venous reflux but really no significant correctable superficial venous reflux at this time. He felt we should continue with conservative management including leg elevation and  compression 04/02/2018; since we last saw this woman about a month ago she had a fall apparently suffered a pelvic fracture. I did not look up the x-ray. Nevertheless because of pain she literally was bedbound for 2 weeks and had home health coming out to change the dressing. Somewhat predictably this is resulted in considerable improvement in both wound areas. The right is just about closed on the medial malleolus  and the left is about half the size. 04/16/2018; both her wounds continue to go down in size. Using Hydrofera Blue. 05/07/18; both her wounds appeared to be improving especially on the right where it is almost closed. We are using Hydrofera Blue 05/14/2018; slightly worse this week with larger wounds. Surface on the left medial not quite as good. We have been using Hydrofera Blue 05/21/18; again the wounds are slightly larger. Left medial malleolus slightly larger with eschar around the circumference. We have been using Hydrofera Blue undergoing a wraps for a prolonged period of time. This got a lot better when she was more recumbent due to a fall and a back injury. I change the primary dressing the silver alginate today. She did not tolerate a 4 layer compression previously although I may need to bring this up with her next time 05/28/2018; area on the left medial malleolus again is slightly larger with more drainage. Area on the right is roughly unchanged. She has a small area of folliculitis on the right medial just on the lower calf. This does not look ominous. 06/03/2018 left medial malleolus slightly smaller in a better looking surface. We used silver nitrate on this last time with silver alginate. The area on the right appears slightly smaller 1/10; left medial malleolus slightly smaller. Small open area on the right. We used silver nitrate and silver alginate as of 2 weeks ago. We continue with the wound and compression. These got a lot better when she was off her feet 1/17; right  medial malleolus wound is smaller. The left may be slightly smaller. Both surfaces look somewhat better. 1/24; both wounds are slightly smaller. Using silver alginate under Unna boots 1/31; both wounds appear smaller in fact the area on the right medial is just about closed. Surface eschar. We have been using silver alginate under Unna boots. The patient is less active now spends let much less time on her feet and I think this is contributed to the general improvement in the wound condition 2/7; both wounds appear smaller. I was hopeful the right medial would be closed however there there is still the same small open area. Slight amount of surface eschar on the left the dimensions are smaller there is eschar but the wound edges appear to be free. We have been using silver alginate under Unna boot's 2/14; both wounds once again measure smaller. Circumferential eschar on the left medial. We have been using silver alginate under Unna boots with gradual improvement 2/21; the area on the right medial malleolus has healed. The area on the left is smaller. We have been using silver alginate and Unna boots. We can discharge wrapping the right leg she has 20/30 stockings at home she will need to protect the scar tissue in this area 2/28; the area on the right medial malleolus remains closed the patient has a compression stocking. The area on the left is smaller. We have been using silver alginate and Unna boots. 3/6 the area on the right medial ankle remains closed. Good edema control noted she is using her own compression stocking. The area on the left medial ankle is smaller. We have been managing this with silver alginate and Unna boots which we will continue today. 3/13; the area on the right medial ankle remains closed and I'm declaring it healed today. When necessary the left is about the same still a healthy-looking surface but no major change and wound area. No evidence of infection and using silver  alginate  under unna and generally making considerable improvement 3/27 the area on the right medial ankle remains closed the area on the left is about the same as last week. Certainly not any worse we have been using silver alginate under an Unna boot 4/3; the area on the right medial ankle remains closed per the patient. We did not look at this wound. The wound on the left medial ankle is about the same surface looks healthy we have been using silver alginate under an Unna boot 4/10; area on the right medial ankle remains closed per the patient. We did not look at this wound. The wound on the left medial ankle is slightly larger. The patient complains that the [facility] caused burning pain all week. She also told us that she was a lot more active this week. Changed her back to silver alginate 4/17; right medial ankle still closed per the patient. Left medial ankle is slightly larger. Using silver alginate. She did not tolerate Hydrofera Blue on this area 4/24; right medial ankle remains closed we have not look at this. The left medial ankle continues to get larger today by about a centimeter. We have been using silver alginate under Unna boots. She complains about 4 layer compression as an alternative. She has been up on her feet working on her garden 5/8; right medial ankle remains closed we did not look at this. The left medial ankle has increased in size about 100%. We have been using silver alginate under Unna boots. She noted increased pain this week and was not surprised that the wound is deteriorated 5/15; no major change in SA however much less erythema ( one week of doxy ocellulitis). 5/22-66 year old female returns at 1 week to the clinic for left medial ankle wound for which we have been using silver alginate under 3 layer compression She was placed on DOXY at last visit - the wound is wider at this visit. She is in 3 layer compression 5/29; change to [facility] last week. I had  given her empiric doxycycline 2 weeks ago for a week. She is in 3 layer compression. She complains of a lot of pain and drainage on presentation today. 6/5; using Hydrofera Blue. I gave her doxycycline recently empirically for erythema and pain around the wound. Believe her cultures showed enterococcus which not would not have been well covered by doxycycline nevertheless the wound looks better and I don't feel specifically that the enterococcus needs to be covered. She has a new what looks like a wrap injury on her lateral left ankle. 6/12; she is using Hydrofera Blue. She has a new area on the left anterior lower tibial area. This was a wrap injury last week. 6/19; the patient is using Hydrofera Blue. She arrived with marked inflammation and erythema around the wound and tenderness. 12/01/18 on evaluation today patient appears to be doing a little bit better based on what I'm hearing from the standpoint of lassos evaluation to this as far as the overall appearance of the wound is concerned. Then sometime substandard she typically sees Dr. Dellia Nims. Nonetheless overall very pleased with the progress that she's made up to this point. No fevers, chills, nausea, or vomiting noted at this time. 7/10; some improvement in the surface area. Aggressively debrided last week apparently. I went ahead with the debridement today although the patient does not tolerate this very well. We have been using Iodoflex. Still a fair amount of drainage 7/17; slightly smaller. Using Iodoflex. 7/24; no change  from last week in terms of surface area. We have been using Iodoflex. Surface looks and continues to look somewhat better 7/31; surface area slightly smaller better looking surface. We have been using Iodoflex. This is under Unna boot compression 8/7-Patient presents at 1 week with Unna boot and Iodoflex, wound appears better 8/14-Patient presents at 1 week with Iodoflex, we use the Unna boot, wound appears to be stable  better.Patient is getting Botox treatment for the inversion of the foot for tendon release, Next week 8/21; we are using Iodoflex. Unna boot. The wound is stable in terms of surface area. Under illumination there is some areas of the wound that appear to be either epithelialized or perhaps this is adherent slough at this point I was not really clear. It did not wipe off and I was reluctant to debride this today. 8/28; we are using Iodoflex in an Unna boot. Seems to be making good improvement. 9/4; using Iodoflex and wound is slightly smaller. 9/18; we are using Iodoflex with topical silver nitrate when she is here. The wound continues to be smaller 10/2; patient missed her appointment last week due to GI issues. She left and Iodoflex based dressing on for 2 weeks. Wound is about the same size about the size of a dime on the left medial lower 10/9 we have been using Iodoflex on the medial left ankle wound. She has a new superficial probable wrap injury on the dorsal left ankle 10/16; we have been using Hydrofera Blue since last week. This is on the left medial ankle 10/23; we have been using Hydrofera Blue since 2 weeks ago. This is on the left medial ankle. Dimensions are better 11/6; using Hydrofera Blue. I think the wound is smaller but still not closed. Left medial ankle 11/13; we have been using Hydrofera Blue. Wound is certainly no smaller this week. Also the surface not as good. This is the remanent of a very large area on her left medial ankle. 11/20; using Sorbact since last week. Wound was about the same in terms of size although I was disappointed about the surface debris 12/11; 3-week follow-up. Patient was on vacation. Wound is measuring slightly larger we have been using Sorbact. 12/18; wound is about the same size however surface looks better last week after debridement. We have been using Sorbact under compression 1/15 wound is probably twice the size of last time increased in length  nonviable surface. We have been using Sorbact. She was running a mild fever and missed her appointment last week 1/22; the wound is come down in size but under illumination still a very adherent debris we have been Hydrofera Blue that I changed her to last week 1/29; dimensions down slightly. We have been using Hydrofera Blue 2/19 dimensions are the same however there is rims of epithelialization under illumination. Therefore more the surface area may be epithelialized 2/26; the patient's wound actually measures smaller. The wound looks healthy. We have been using Hydrofera Blue. I had some thoughts about running Apligraf then I still may do that however this looks so much better this week we will delay that for now 3/5; the wound is small but about the same as last week. We have been using Hydrofera Blue. No debridement is required today. 3/19; the wound is about the size of a dime. Healthy looking wound even under illumination. We have been using Hydrofera Blue. No mechanical debridement is necessary 3/26; not much change from last week although still looks very healthy. We  have been using Hydrofera Blue under [employer] Patient was offered an ankle fusion by podiatry but not until the wound heals with a proceed with this. 4/9; the patient comes in today with her original wound on the medial ankle looking satisfactory however she has some uncontrolled swelling in the middle part of her leg with 2 new open areas superiorly just lateral to the tibia. I think this was probably a wrap issue. She said she felt uncomfortable during the week but did not call in. We have been using Hydrofera Blue 4/16; the wound on the medial ankle is about the same. She has innumerable small areas superior to this across her mid tibia. I think this is probably folliculitis. She is also been working in the yard doing a lot of sweating 4/30; the patient issue on the upper areas across her mid tibia of all healed. I think  this was excessive yard work if I remember. Her wound on the medial ankle is smaller. Some debris on this we have been using Hydrofera Blue under Unna boots 5/7; mid tibia. She has been using Hydrofera Blue under an Unna wrap. She is apparently going for her ankle surgery on June 3 10/28/19-Patient returns to clinic with the ankle wound, we are using Hydrofera Blue under Unna wrap, surgery is scheduled for her left foot for June 3 so she will be back for nurse visit next week READMISSION 01/17/2020 Mrs. Cammarata is a 66 year old woman we have had in this clinic for a long period of time with severe venous hypertension and refractory wounds on her medial lower legs and ankles bilaterally. 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 When she was off her legs for a prolonged period example when she fell and suffered a compression fracture things would heal up quite nicely. She is now retired and we managed to heal up the right medial leg wound. The left one was very tiny last time I saw this although still refractory. 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 It got to the point where this was interfering with her walking and she elected to proceed with a ankle arthrodesis to straighten her her ankle and leave her with a functional outcome for mobilization. The patient was referred to Dr. Doren Custard and really this took some time to arrange. Dr. Doren Custard saw her on 12/07/2019. Once again he verified that she had no arterial issues. She had previously had an angiogram several years ago. Follow-up ABIs on the left showed an ABI of 1.12 with triphasic waveforms and a TBI of 0.92. She is felt to have chronic deep venous insufficiency but I do not think it was felt that anything could be done from about this from  an ablation point of view. At the time Dr. Doren Custard saw this patient the wounds actually look closed via the pictures in his clinic. The patient finally underwent her surgery on 12/15/2019. This went reasonably well and there was a good anatomic outcome. She developed a small distal wound dehiscence on the lateral part of the surgical wound. However more problematically she is developed recurrence of the wound on the medial left ankle. There are actually 2 wounds here one in the distal lower leg and 1 pretty much at the level of the medial malleolus.  It is a more distal area that is more problematic. She has been using Hydrofera Blue which started on Friday before this she was simply Ace wrapping. There was a culture done that showed Pseudomonas and she is on ciprofloxacin. A recent CNS on 8/11 was negative. The patient reports some pain but I generally think this is improving. She is using a cam boot completely nonweightbearing using a walker for pivot transfers and a wheelchair 8/24; not much improvement unfortunately she has a surgical wound on the lateral part in the venous insufficiency wound medially. The bottom part of the medial insufficiency wound is still necrotic there is exposed tendon here. We have been using Hydrofera Blue under compression. Her edema control is however better 8/31; patient in for follow-up of his surgical wound on the lateral part of her left leg and chronic venous insufficiency ulcers medially. We put her back in compression last week. She comes in today with a complaint of 3 or 4 days worth of increasing pain. She felt her cam walker was rubbing on the area on the back of her heel. However there is intense erythema seems more likely she has cellulitis. She had 2 cultures done when she was seeing podiatry in the postop. One of them in late July showed Pseudomonas and she received a course of ciprofloxacin the other was negative on 8/11 she is allergic to penicillin  with anaphylactoid complaints of hives oral swelling via information in epic 9/9; when I saw this patient last week she had intense anterior erythema around her wound on the right lateral heel and ankle and also into the right medial heel. Some of this was no doubt drainage and her walker boot however I was convinced she had cellulitis. I gave her Levaquin and Bactrim she is finishing up on this now. She is following up with Dr. [name] he saw her yesterday. He is taken her out of the walking boot of course she is still nonweightbearing. Her x-ray was negative for any worrisome features such as soft tissue air etc. Things are a lot better this week. She has home health. We have been using Hydrofera Blue under an [employer] which she put back on yesterday. I did not wrap her last week 9/17; her surrounding skin looks a lot better. In fact the area on the left lateral ankle has just a scant amount of eschar. The only remaining wound is the large area on the left medial ankle. Probably about 60% of this is healthy granulation at the surface however she has a significant divot distally. This has adherent debris in it. I been using debridement and silver collagen to try and get this area to fill-in although I do not think we have made much progress this week 9/24; the patient's wound on the left medial ankle looks a lot better. The deeper divot area distally still requires debridement but this is cleaning up quite nicely we have been using silver collagen. The patient is complaining of swelling in her foot and is worried that that is contributing to the nonhealing of the ankle wound. She is also complaining of numbness in her anterior toes 10/4; left medial ankle. The small area distally still has a divot with necrotic material that I have been debriding away. This has an undermining area. She is approved for Apligraf. She saw Dr. [name] her surgeon on 10/1. I think he declared himself is satisfied with the  condition of things. Still nonweightbearing till the end of the  month. 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 Her surgical wound is well closed. There is no evidence of infection 10/11; the patient arrived in clinic today with the expectation that we be able to put an Apligraf on this area after debridement however she arrives with a relatively large amount of green drainage on the dressing. The patient states that this started on Friday. She has not been systemically unwell. 10/19; culture I did last week showed both Enterococcus and Pseudomonas. I think this came in separate parts because I stopped her ciprofloxacin I gave her and prescribed her linezolid however now looking at the final culture result this is Pseudomonas which is resistant to quinolones. She has not yet picked up the linezolid apparently phone issues. We are also trying to get a topical antibiotic out of [place] in [place] they can be applied by home health. She is still having green drainage 10/16; the patient has her topical antibiotic from [facility] in [place]. This is a compounded gel with vancomycin and ciprofloxacin and gentamicin. We are applying this on the wound bed with silver alginate over the top with Unna boot wraps. She arrives in clinic today with a lot less ominous looking drainage although she is only use this topical preparation once the second time today. She sees Dr. [name] her surgeon on Friday she has home health changing the dressing 11/2; still using her compounded topical antibiotic under silver alginate. Surface is cleaning up there is less drainage. We had an Apligraf for her today and I elected to apply it. A light coating of her antibiotic 04/25/2020 upon evaluation today patient appears to be doing well with regard to her ankle ulcer. There is a little bit of slough noted on the surface of the wound I am can have to perform sharp debridement to clear  this away today. With that being said other than that fact overall I feel like she is making progress and we do see some new epithelial growth. There is also some improvement in the depth of the wound and that distal portion. There is little bit of slough there as well. 12/7; 2-week follow-up. Apligraf #3. Dimensions are smaller. Closing in especially inferiorly. Still some surface debris. Still using the [facility] topical antibiotic but I told her that I don't think this needs to be renewed 12/21; 2-week follow-up. Apligraf #4 dimensions are smaller. Nice improvement 06/05/2020; 2-week follow-up. The patient's wound on the left medial ankle looks really excellent. Nice granulation. Advancing epithelialization no undermining no evidence of infection. We would have to reapply for another Apligraf but with the condition of this wound I did not feel strongly about it. We used Hydrofera Blue under the same degree of compression. She follows up with Dr. [name] her surgeon a week Friday 06/13/2020 upon evaluation today patient appears to be doing excellent in regard to her wound. She has been tolerating the dressing changes without complication. Fortunately there is no signs of active infection at this time. No fevers, chills, nausea, vomiting, or diarrhea. She was using Hydrofera Blue last week. 06/20/2020 06/20/2020 on evaluation today patient actually appears to be doing excellent in regard to her wound. This is measuring better and looking much better as well. She has been using the collagen that seems to be doing better for her as well even though the [facility] was and is not sticking or feeling as rough on her wound. She did see Dr. [name] on Friday he  is very pleased he also stated none of the hardware has shifted. That is great news 1/27; the patient has a small clean wound all that is remaining. I agree that this is too small to really consider an Apligraf. Under illumination the surface is looking  quite good. We have been using collagen although the dimensions are not any better this week 2/2; the patient has a small clean wound on the left medial ankle. Although this left of her substantial original areas. Measurements are smaller. We have been using polymen Ag under an Haematologist. 2/10; small area on the left medial ankle. This looks clean nothing to debride however dimensions are about the same we have been using polymen I think now for 2 weeks 2/17; not much change in surface area. We have been using polymen Ag without any improvement. 3/17; 1 month follow-up. The patient has been using endoform without any improvement in fact I think this looks worse with more depth and more expansion 3/24; no improvement. Perhaps less debris on the surface. We have been using Sorbact for 1 week 4/4; wound measures larger. She has edema in her leg and her foot which she tells as her wrap came down. We have been using Unna boots. Sorbact of the wound. She has been approved for Apligraf 09/12/2020 upon evaluation today patient appears to be doing well with regard to her wound. We did get the Apligraf reapproved which is great news we have that available for application today. Fortunately there is no signs of infection and overall the patient seems to be doing great. The wound bed is nice and clean. 4/27; patient presents for her second application of Apligraf. She states over the past week she has been on her feet more often due to being outside in her garden. She has noted more swelling to her foot as a result. She denies increased warmth, pain or erythema to the wound site. 10/10/2020 upon evaluation today patient appears to be doing well with regard to her wound which does not appear to be quite as irritated as last week from what I am hearing. With that being said unfortunately she is having issues with some erythema and warmth to touch as well as an increase size. I do believe this likely is  infected. 10/17/2020 upon evaluation today patient appears to be doing excellent in regard to her wound this is significantly improved as compared to last week. Fortunately I think that the infection is much better controlled at this point. She did have evidence of both Enterococcus as well as Staphylococcus noted on culture. Enterococcus really would not be helped significantly by the Cipro but the wound is doing so much better I am under the assumption that the Staphylococcus is probably the main organism that is causing the current infection. Nonetheless I think that she is doing excellent as far as that is concerned and I am very pleased in that regard. I would therefore recommend she continue with the Cipro. 10/31/2020 upon evaluation today patient appears to be doing well with regard to her wound. She has been tolerating the dressing changes without complication. Fortunately there is no signs of active infection and overall I am extremely pleased with where things stand today. No fevers, chills, nausea, vomiting, or diarrhea. With that being said she does have some green drainage coming from the wound and although it looks okay I am a little concerned about the possibility of a continuing infection. Specifically with Pseudomonas. For that reason I  will go ahead and send in a prescription for Cipro for her to be continued. 11/14/2020 upon evaluation today patient appears to be doing very well currently in regard to her wound on her leg. She has been tolerating the dressing changes without complication. Fortunately I feel like the infection is finally under good control here. Unfortunately we do not have the Apligraf for application today although we can definitely order to have it in place for next week. That will be her fifth and final of the current series. Nonetheless I feel like her wound is really doing quite well which is great news. 11/21/2020 upon evaluation today patient appears to be doing  well with regard to her wound on the medial ankle. Fortunately I think the infection is under control and I do believe we can go ahead and reapply the Apligraf today. She is in agreement with that plan. There does not appear to be any signs of active infection at this time which is great news. No fevers, chills, nausea, vomiting, or diarrhea. 12/05/2020 upon evaluation today patient's wound bed actually showed signs of good granulation epithelization at this point. There does not appear to be any signs of infection which is great news and overall very pleased with where things stand. Overall the patient seems to be doing fairly well in my opinion with regard to her wound although I do believe she continues to build up a lot of biofilm I think she could benefit from using PuraPly at this point. 12/12/2020 upon evaluation today patient's wound actually appears to be doing decently well today. The Unna boot has not been quite as well-tolerated so that more uncomfortable for her and even causing some pressure over the plate on the lateral portion of her foot which is 90 where the wound is. There did not appear to be any significant deep tissue injury with that there may be a minimal change in the skin noted I think that we may want to go back to the Coflex 2 layer which is a little bit easier on her skin it seems. 12/19/2020 upon evaluation today patient actually seems to be making great progress with the PuraPly currently. She in fact seems to be much better as far as the overall appearance of the wound bed is concerned I am very happy in this regard. I do not see any signs of of infection which is great news as well. No fevers, chills, nausea, vomiting, or diarrhea. 12/26/2020 upon evaluation today patient appears to actually be doing better in regard to her wound on the left medial ankle region. The surface of the wound is actually doing significantly better which is great news. There does not appear to be  any signs of infection which is also great news and in general I am extremely pleased with where we stand today. 01/02/2021 upon evaluation today patient appears to be doing well with regard to her wound. In fact this is showing signs of excellent improvement and very pleased with where things stand. In fact the last 3 appointments have all shown signs of this getting smaller which is excellent news. I have not even had to perform any debridement and today is no exception. Overall I feel like this is dramatically improved compared to previous. T oday is PuraPly application #4. 7/82/4235 upon evaluation today patient appears to be doing excellent in regard to her wound this is continue to show signs of improvement and overall I am extremely pleased with where we stand today.  She is actually here for PuraPly application #5. Every time we have applied this she is noted definite improvement on measurements. 01/23/2021 upon evaluation today patient is actually making good progress in regard to her wound. This was actually on just a little bit longer this time compared to previous due to the fact that she did have to go out of town. She is actually here for PuraPly application #6. We have definitely been seeing improvements in the overall quality of the tissue on the surface of the wound which is awesome news. In general I think that the patient seems to be continuing to make great progress here. 01/30/2021 upon evaluation today patient's wound is actually doing excellent. There is really not any significant biofilm buildup which is great news and overall I am extremely pleased with where things stand today. There does not appear to be any signs of active infection. No fevers, chills, nausea, vomiting, or diarrhea. 02/06/2021 upon evaluation today patient's wound is actually showing signs of excellent improvement which is great news. We continue to see the benefit of the PuraPly this is doing a great job the  wound seems not really irritated whatsoever and is showing signs of good granulation at this point. Overall I am extremely happy with what we are seeing. The patient likewise is happy to hear all of this as well. 02/13/2021 upon evaluation today patient appears to be doing well with regard to her wound. This again is measuring smaller today and I am very pleased in this regard. Fortunately there does not appear to be any signs of active infection at this time which is good news from a systemic standpoint. Locally there is still some significant drainage which she does have is concerned about infection locally. No fevers, chills, nausea, vomiting, or diarrhea. 02/20/2021 upon evaluation today patient actually appears to be doing decently well in regard to her wound. She has been tolerating the dressing changes without complication. I do believe the PuraPly is helping wound bed does appear to be doing better. There is no evidence of active infection locally or systemically at this point visually although on fluorescence imaging there still appear to be bacterial load and bioburden noted. 02/27/2021 upon evaluation today patient fortunately continues to show signs of improvement with use of the PuraPly currently. Subsequently we did review her culture results and to be honest I think that the Bactrim is probably the best option to have her continue at this point. For that reason I am get a go ahead and send in a refill today for her for an additional 2 weeks. Nonetheless I think that we are making excellent progress here. It was Enterococcus and Staphylococcus that were noted she is allergic to penicillin so there is not much I can do from the Enterococcus standpoint though the staff does seem to be sensitive to the Bactrim. 03/06/2021 upon evaluation today patient appears to be doing well with regard to her wound. This is definitely showing signs of improvement which is great news and overall I am extremely  pleased with where things stand today. There does not appear to be any signs of active infection which is great news and overall and I do believe that we are headed in the appropriate direction I think the PuraPly is doing an awesome job for her. 03/20/2021 upon evaluation today patient actually appears to be doing well with regard to her wound. We have been using the PuraPly although last week when I was out of the  office they actually just used endoform nonetheless this still seems to be doing great. Fortunately there does not appear to be any signs of active infection which is great news overall I think the patient is making wonderful progress. 03/27/2021 upon evaluation today patient appears to be doing well with regard to her wound in fact there is a little drainage but otherwise her does not appear to be any major signs of open wounds nor infection at this time. Overall I am extremely pleased with where things stand. No fevers, chills, nausea, vomiting, or diarrhea. 04/03/2021 upon evaluation today patient presents for reevaluation here in the clinic and overall she seems to be doing quite well. Fortunately there does not appear to be any signs of active infection which is great news and in general I am very pleased with how things appear. There is still a very small opening remaining but in general she is looking much better. Fortunately I do not think there is any need for the PuraPly at this point. 04/10/2021 upon evaluation today patient's wound is actually showing signs of excellent improvement. I am actually very pleased with where it stands today. I think she is very close to complete closure in fact this may be healed today. Either way I think that she may benefit from using her own compression socks for the next week and then will subsequently see how things stand following. 04/17/2021 upon evaluation today patient appears to be doing well with regard to her wound on the ankle region. This  is actually showing signs of excellent improvement which is great news. I do not see any signs of active infection at this time which is great and no open wound. With that being said on the lateral portion of her ankle right at the base of her plate she actually has an area of pressure that occurred over the past week when she was not in the wrap or we been padding her. This does seem to be an issue which I think is good to be an ongoing problem I am can actually get in touch with Dr. [name] today to see what we can do in that regard. 04/24/2021 upon evaluation today patient appears to be doing well with regard to her ankle region for the most part although there does appear to be some fluid trapped underneath the area where we thought this was healed last week. Unfortunately I think at this time that we can have to reopen and clean away some of this area and then subsequently depending on how things do over the next week or so we will see about discharge but right now I do not think that is the right way to go. She voiced understanding. 05/01/2021 upon evaluation today patient's wound still appears to be open unfortunately. I do believe that we do need to see what we can do about trying to get this closed I believe compression wrapping is probably can to be indicated here. Fortunately there is no signs of active infection at this time. 05/08/2021 upon evaluation today patient appears to be doing okay in regard to her ulcer this is very superficial and we will see any major issues here. The biggest problem currently that I find is that we just cannot get this area to completely seal up. She seemed to almost be and then things digressed a little bit. Right now is not too bad but we do need to see if we can make some difference here. I think maybe  switching over to collagen may be better. 05/15/2021 upon evaluation today patient actually appears to be doing pretty well in regard to her wound. This is  measuring a little smaller and looking much better. She did see orthopedics today there to me making her a insert for her shoe to try to help straighten out her gait. I think this can be beneficial for her as well. 05/22/2021 upon evaluation today patient appears to be doing well currently in regard to her wound although is not significantly smaller unfortunately this at least does not show any signs of active infection at this time which is great news. No fevers, chills, nausea, vomiting, or diarrhea. I am going to see about switching over to endoform that is what we wanted to use in the past always been on backorder. 06/05/2021 upon evaluation today patient appears to be doing somewhat poorly in regard to her wound. This is really not showing the signs of improvement that I would have like to have seen. Fortunately I do not see any signs of active infection locally nor systemically which is great news. With that being said even though I do not see the sign she is having some increased discomfort which is not good. She is also having some issues here with the wound looking a little larger today compared to where its been. With that being said overall I believe that we probably need to consider at least that there may be a subacute infection here and that is what is going on. We have tried multiple different medications and dressings which have done well for her in the past without seeing any signs of improvement. She is in agreement with addressing this as such. 06/12/2020 upon evaluation today patient's wound is actually showing some signs of improvement today. In fact I do not even see any need for sharp debridement based on what I see at this point today. Fortunately there is no evidence of active infection currently which is great news. No fevers, chills, nausea, vomiting, or diarrhea. 06/26/2021 upon evaluation today patient appears to be doing well with regard to her wounds. Fortunately there does  not appear to be any signs of active infection at this time which is great news. I do believe the Bactrim is doing well for her. Fortunately there does not appear to be any signs of active infection at this time. With the hardware in place and everything else I do believe extending this for 2 additional weeks for 4 weeks total would be advisable. 07/03/2021 upon evaluation today patient appears to be doing well with regard to her wound. She has been tolerating the dressing changes which is great news. Fortunately there does not appear to be any evidence of active infection locally nor systemically at this time. 07/10/2021 upon evaluation today patient appears to be doing well with regard to her wound I do feel that the PuraPly has done an awesome job for her over the past week. Fortunately I do not see any signs of active infection at this time. No fevers, chills, nausea, vomiting, or diarrhea. Objective Constitutional Well-nourished and well-hydrated in no acute distress. Vitals Time Taken: 8:50 AM, Height: 68 in, Temperature: 98.4 F, Pulse: 64 bpm, Respiratory Rate: 18 breaths/min, Blood Pressure: 128/71 mmHg. Respiratory normal breathing without difficulty. Psychiatric this patient is able to make decisions and demonstrates good insight into disease process. Alert and Oriented x 3. pleasant and cooperative. General Notes: Upon inspection patient's wound is showing signs of some dry skin  around the edges of the wound but actually has a lot of new skin covering over the central portion of the wound. Fortunately I do not see any signs of infection locally nor systemically at this point and overall I am extremely pleased with where we stand. Integumentary (Hair, Skin) Wound #15R status is Open. Original cause of wound was Gradually Appeared. The date acquired was: 12/30/2019. The wound has been in treatment 77 weeks. The wound is located on the Left,Medial Malleolus. The wound measures 0.9cm length x  0.7cm width x 0.2cm depth; 0.495cm^2 area and 0.099cm^3 volume. There is Fat Layer (Subcutaneous Tissue) exposed. There is no tunneling or undermining noted. There is a medium amount of serosanguineous drainage noted. The wound margin is distinct with the outline attached to the wound base. There is large (67-100%) red, pink granulation within the wound bed. There is no necrotic tissue within the wound bed. 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 There was a Selective/Open Wound Skin/Epidermis Debridement with a total area of 0.25 sq cm performed by [name], PA. With the following instrument(s): Curette to remove Non-Viable tissue/material. Material removed includes Callus, Skin: Dermis, and Skin: Epidermis after achieving pain control using Lidocaine 5% topical ointment. A time out was conducted at 09:10, prior to the start of the procedure. There was no bleeding. The procedure was tolerated well with a pain level of 0 throughout and a pain level of 0 following the procedure. Post Debridement Measurements: 0.9cm length x 0.7cm width x 0.2cm depth; 0.099cm^3 volume. Character of Wound/Ulcer Post Debridement is improved. Severity of Tissue Post Debridement is: Fat layer exposed. Post procedure Diagnosis Wound #15R: Same as Pre-Procedure Pre-procedure diagnosis of Wound #15R is a Venous Leg Ulcer located on the Left,Medial Malleolus. A skin graft procedure using a bioengineered skin substitute/cellular or tissue based product was performed by [name], PA with the  following instrument(s): Forceps and Scissors. Puraply AM was applied and secured with adaptic. 0 sq cm of product was utilized and 2 sq cm was wasted due to wound size. Post Application, no dressing was applied. A Time Out was conducted at 09:15, prior to the start of the procedure. The procedure was tolerated well with a pain level of 0 throughout and a pain level of 0 following the procedure. Post procedure Diagnosis Wound #15R: Same as Pre-Procedure . Pre-procedure diagnosis of Wound #15R is a Venous Leg Ulcer located on the Left,Medial Malleolus . There was a Double Layer Compression Therapy Procedure by Deon Pilling, RN. Post procedure Diagnosis Wound #15R: Same as Pre-Procedure Plan Follow-up Appointments: Return Appointment in 1 week. Margarita Grizzle Wednesday Room 3 Cellular or Tissue Based Products: Cellular or Tissue Based Product Type: - Puraply AM #2 07/10/2021 Plan for #3 07/17/2021 Bathing/ Shower/ Hygiene: May shower with protection but do not get wound dressing(s) wet. Edema Control - Lymphedema / SCD / Other: Elevate legs to the level of the heart or above for 30 minutes daily and/or when sitting, a frequency of: - throughout the day when sitting Avoid standing for long periods of time. Patient to wear own compression stockings every day. Exercise regularly Moisturize  legs daily. Non Wound Condition: Apply the following to affected area as directed: - Apply silicone bordered foam to left lateral foot for protection WOUND #15R: - Malleolus Wound Laterality: Left, Medial Cleanser: Wound Cleanser (Generic) 1 x Per Day/15 Days Discharge Instructions: Cleanse the wound with wound cleanser prior to applying a clean dressing using gauze sponges, not tissue or cotton balls. Peri-Wound Care: Sween Lotion (Moisturizing lotion) 1 x Per Day/15 Days Discharge Instructions: Apply moisturizing lotion as directed Prim Dressing: Puraply AM 1 x Per EXN/17 Days ary Discharge Instructions:  Provider applied to wound bed. Secondary Dressing: Woven Gauze Sponge, Non-Sterile 4x4 in (Generic) 1 x Per Day/15 Days Discharge Instructions: Apply over primary dressing as directed. Secondary Dressing: ABD Pad, 8x10 1 x Per Day/15 Days Discharge Instructions: Apply over primary dressing as directed. Secondary Dressing: ADAPTIC TOUCH 3x4.25 in 1 x Per Day/15 Days Discharge Instructions: Apply over primary dressing. Com pression Wrap: CoFlex TLC XL 2-layer Compression System 4x7 (in/yd) 1 x Per Day/15 Days Discharge Instructions: Apply CoFlex 2-layer compression as directed. (alt for 4 layer) 1. Would recommend currently that we going continue with the wound care measures as before and the patient is in agreement with plan. This includes the use of the PuraPly which I think has done also for her this is application #2. 2. I am also can recommend that we have the patient continue with the Adaptic over top. I am not using any Steri-Strips at this point. 3. I am also going to suggest the patient continue to monitor for any signs of worsening infection if anything changes she should let me know soon as possible. We will see patient back for reevaluation in 1 week here in the clinic. If anything worsens or changes patient will contact our office for additional recommendations. Electronic Signature(s) Signed: 07/10/2021 10:00:41 AM By: [name] PA-C Entered By: [name] on 07/10/2021 10:00:41 -------------------------------------------------------------------------------- SuperBill Details Patient Name: Date of Service: [facility] [name], [name] 07/10/2021 Medical Record Number: [id number] Patient Account Number: [id number] Date of Birth/Sex: Treating RN: Mar 03, 1956 (66 y.o. [name], Meta.Reding Primary Care Provider: [name] Other Clinician: Referring Provider: Treating Provider/Extender: [name] in Treatment: 32 Diagnosis Coding ICD-10 Codes Code  Description [phone] Chronic venous hypertension (idiopathic) with ulcer and inflammation of left lower extremity L97.828 Non-pressure chronic ulcer of other part of left lower leg with other specified severity L97.328 Non-pressure chronic ulcer of left ankle with other specified severity Facility Procedures CPT4 Code: [id number] Description: [id number] PuraPly AM 3X4 (12sq. cm) enter 12qty Modifier: Quantity: 2 CPT4 Code: [id number] Description: 09233 - SKIN SUB GRAFT TRNK/ARM/LEG ICD-10 Diagnosis Description L97.828 Non-pressure chronic ulcer of other part of left lower leg with other specified s Modifier: everity Quantity: 1 Physician Procedures : CPT4 Code Description Modifier [id number] - WC PHYS SKIN SUB GRAFT TRNK/ARM/LEG ICD-10 Diagnosis Description L97.828 Non-pressure chronic ulcer of other part of left lower leg with other specified severity Quantity: 1 Electronic Signature(s) Signed: 07/10/2021 10:01:20 AM By: [name] PA-C Entered By: [name] on 07/10/2021 10:01:19

## 2021-07-17 ENCOUNTER — Other Ambulatory Visit: Payer: Self-pay

## 2021-07-17 ENCOUNTER — Encounter (HOSPITAL_BASED_OUTPATIENT_CLINIC_OR_DEPARTMENT_OTHER): Payer: Medicare Other | Admitting: Internal Medicine

## 2021-07-17 DIAGNOSIS — I87332 Chronic venous hypertension (idiopathic) with ulcer and inflammation of left lower extremity: Secondary | ICD-10-CM | POA: Diagnosis not present

## 2021-07-17 NOTE — Progress Notes (Addendum)
TAKOYA, JONAS ([id number]) Visit Report for 07/17/2021 Cellular or Tissue Based Product Details Patient Name: Date of Service: [facility] [name], [name] 07/17/2021 9:30 A M Medical Record Number: [id number] Patient Account Number: [id number] Date of Birth/Sex: Treating RN: 1955-07-04 (66 y.o. [name] Primary Care Provider: [name] Other Clinician: Referring Provider: Treating Provider/Extender: [name] in Treatment: 66 Cellular or Tissue Based Product Type Wound #15R Left,Medial Malleolus Applied to: Performed By: Physician [name]., MD Cellular or Tissue Based Product Type: Puraply AM Level of Consciousness (Pre-procedure): Awake and Alert Pre-procedure Verification/Time Out Yes - 10:10 Taken: Location: trunk / arms / legs Wound Size (sq cm): 0.06 Product Size (sq cm): 4 Waste Size (sq cm): 2 Waste Reason: wound size Amount of Product Applied (sq cm): 2 Lot #: [id number] Order #: [id number] Expiration Date: 10/20/2023 Fenestrated: No Reconstituted: Yes Solution Type: Normal Saline Solution Amount: 59mL Lot #: [id number] Solution Expiration Date: 01/31/2022 Secured: Yes Secured With: adaptic Dressing Applied: No Procedural Pain: 0 Post Procedural Pain: 0 Response to Treatment: Procedure was tolerated well Level of Consciousness (Post- Awake and Alert procedure): Post Procedure Diagnosis Same as Pre-procedure Electronic Signature(s) Signed: 07/30/2021 3:24:27 PM By: Deon Pilling RN, BSN Signed: 07/30/2021 4:14:30 PM By: Linton Ham MD Previous Signature: 07/17/2021 4:36:32 PM Version By: Linton Ham MD Entered By: Deon Pilling on 07/30/2021 15:23:17 -------------------------------------------------------------------------------- HPI Details Patient Name: Date of Service: [name], [name] 07/17/2021 9:30 A M Medical Record Number: [id number] Patient Account Number: [id number] Date of Birth/Sex: Treating RN: 25-Aug-1955  (66 y.o. [name] Provider: [name] Other Clinician: Referring Provider: Treating Provider/Extender: [name] in Treatment: 53 History of Present Illness HPI Description: the remaining wound is over the left medial ankle. Similar wound over the right medial ankle healed largely with use of Apligraf. Most recently we have been using Hydrofera Blue over this wound with considerable improvement. The patient has been extensively worked up in the past for her venous insufficiency and she is not a candidate for antireflux surgery although I have none of the details available currently. 08/24/14; considerable improvement today. About 50% of this wound areas now epithelialized. The base of the wound appears to be healthier granulation.as opposed to last week when she had deteriorated a considerable improvement 08/17/14; unfortunately the wound has regressed somewhat. The areas of epithelialization from the superior aspect are not nearly as healthy as they were last week. The patient thinks her Hydrofera Blue slipped. 09/07/14; unfortunately the area has markedly regressed in the 2 weeks since I've seen this. There is an odor surrounding erythema. The healthy granulation tissue that we had at the base of the wound now is a dusky color. The nurse reports green drainage 09/14/14; the area looks somewhat better than last week. There is less erythema and less drainage. The culture I did did not show any growth. Nevertheless I think it is better to continue the Cipro and doxycycline for a further week. The remaining wound area was debridement. 09/21/14. Wound did not require debridement last week. Still less erythema and less drainage. She can complete her antibiotics. The areas of epithelialization in the superior aspect of the wound do not look as healthy as they did some weeks ago 10/05/14 continued improvement in the condition of this wound. There is  advancing epithelialization. Less aggressive debridement required 10/19/14 continued improvement in the condition and volume of this wound. Less aggressive debridement to  the inferior part of this to remove surface slough and fibrinous eschar 11/02/14 no debridement is required. The surface granulation appears healthy although some of her islands of epithelialization seem to have regressed. No evidence of infection 11/16/14; lites surface debridement done of surface eschar. The wound does not look to be unhealthy. No evidence of infection. Unfortunately the patient has had podiatry issues in the right foot and for some reason has redeveloped small surface ulcerations in the medial right ankle. Her original presentation involved wounds in this area 11/23/14 no debridement. The area on the right ankle has enlarged. The left ankle wound appears stable in terms of the surface although there is periwound inflammation. There has been regression in the amount of new skin 11/30/14 no debridement. Both wound areas appear healthy. There was no evidence of infection. The the new area on the right medial ankle has enlarged although that both the surfaces appear to be stable. 12/07/14; Debridement of the right medial ankle wound. No no debridement was done on the left. 12/14/14 no major change in and now bilateral medial ankle wounds. Both of these are very painful but the no overt evidence of infection. She has had previous venous ablation 12/21/14; patient states that her right medial ankle wound is considerably more painful last week than usual. Her left is also somewhat painful. She could not tolerate debridement. The right medial ankle wound has fibrinous surface eschar 12/28/14 this is a patient with severe bilateral venous insufficiency ulcers. For a considerable period of time we actually had the one on the right medial ankle healed however this recently opened up again in June. The left medial ankle wound has  been a refractory area with some absent flows. We had some success with Hydrofera Blue on this area and it literally closed by 50% however it is recently opened up Foley. Both of these were debridement today of surface eschar. She tolerates this poorly 01/25/15: No change in the status of this. Thick adherent escar. Very poor tolerance of any attempt at debridement. I had healed the right medial malleolus wound for a considerable amount of time and had the left one down to about 50% of the volume although this is totally regressed over the last 48 weeks. Further the right leg has reopened. she is trying to make a appointment with pain and vascular, previous ablations with Dr. Aleda Grana. I do not believe there is an arterial insufficiency issue here 02/01/15 the status of the adherent eschar bilaterally is actually improved. No debridement was done. She did not manage to get vascular studies done 02/08/15 continued debridement of the area was done today. The slough is less adherent and comes off with less pressure. There is no surrounding infection peripheral pulses are intact 02/15/15 selective debridement with a disposable curette. Again the slough is less adherent and comes off with less difficulty. No surrounding infection peripheral pulses are intact. 02/22/15 selective debridement of the right medial ankle wound. Slough comes off with less difficulty. No obvious surrounding infection peripheral pulses are intact I did not debridement the one on the left. Both of these are stable to improved 03/01/15 selective debridement of both wound areas using a curette to. Adherent slough cup soft with less difficulty. No obvious surrounding infection. The patient tells me that 2 days ago she noted a rash above the right leg wrap. She did not have this on her lower legs when she change this over she arrives with widespread left greater than right almost folliculitis-looking  rash which is extremely pruritic. I  don't see anything to culture here. There is no rash on the rest of her body. She feels well systemically. 03/08/15; selective debridement of both wounds using a curette. Base of this does not look unhealthy. She had limegreen drainage coming out of the left leg wound and describes a lot of drainage. The rash on her left leg looks improved to. No cultures were done. 03/22/15; patient was not here last week. Basal wounds does not look healthy and there is no surrounding erythema. No drainage. There is still a rash on the left leg that almost looks vasculitic however it is clearly limited to the top of where the wrap would be. 04/05/15; on the right required a surgical debridement of surface eschar and necrotic subcutaneous tissue. I did not debridement the area on the left. These continue to be large open wounds that are not changing that much. We were successful at one point in healing the area on the right, and at the same time the area on the left was roughly half the size of current measurements. I think a lot of the deterioration has to do with the prolonged time the patient is on her feet at work 04/19/15 I attempted-like surface debridement bilaterally she does not tolerate this. She tells me that she was in allergic care yesterday with extreme pain over her left lateral malleolus/ankle and was told that she has an "sprain" 05/03/15; large bilateral venous insufficiency wounds over the medial malleolus/medial aspect of her ankles. She complains of copious amounts of drainage and his usual large amounts of pain. There is some increasing erythema around the wound on the right extending into the medial aspect of her foot to. historically she came in with these wounds the right one healed and the left one came down to roughly half its current size however the right one is reopened and the left is expanded. 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 05/10/15 large bilateral venous insufficiency wounds. There is less adherence surface left however the surface culture that I did last week grew pseudomonas therefore bilateral selective debridement score necessary. There is surrounding erythema. The patient describes severe bilateral drainage and a lot of pain in the left ankle. Apparently her podiatrist was were ready to do a cortisone shot 05/17/15; the patient complains of pain and again copious amounts of drainage. 05/24/15; we used Iodo flex last week. Patient notes considerable improvement in wound drainage. Only needed to change this once. 05/31/15; we continued Iodoflex; the base of these large wounds bilaterally is not too bad but there is probably likely a significant bioburden here. I would like to debridement just doesn't tolerate it. 06/06/14 I would like to continue the Iodoflex although she still hasn't managed to obtain supplies. She has bilateral medial malleoli or large wounds which are mostly superficial. Both of them are covered circumferentially with some nonviable fibrinous slough although she tolerates debridement very poorly. She apparently has an appointment for an ablation on the right leg by interventional radiology. 06/14/15; the patient arrives with the wounds and static condition. We attempted a debridement although she does not do well with this secondary to pain. I 07/05/15; wounds are not much smaller however there appears to be a cleaner granulating base. The left has tight fibrinous slough greater than the right. Debridement is tolerated poorly due to pain. Iodoflex is done more for these  wounds in any of the multitude of different dressings I have tried on the left 1 and then subsequently the right. 07/12/15; no change in the condition of this wound. I am able to do an aggressive debridement on the right but not the left. She simply cannot tolerate it. We have been using Iodoflex which helps somewhat. It is  worthwhile remembering that at one point we healed the right medial ankle wound and the left was about 25% of the current circumference. We have suggested returning to vascular surgery for review of possible further ablations for one reason or another she has not been able to do this. 07/26/15 no major change in the condition of either wound on her medial ankle. I did not attempt to debridement of these. She has been aggressively scrubbing these while she is in the shower at home. She has her supply of Iodoflex which seems to have done more for these wounds then anything I have put on recently. 08/09/15 wound area appears larger although not verified by measurements. Using Iodoflex 09/05/2015 -- she was here for avisit today but had significant problems with the wound and I was asked to see her for a physician opinion. I have summarize that this lady has had surgery on her left lower extremity about 10 years ago where the possible veins stripping was done. She has had an opinion from interventional radiology around November 2016 where no further sclerotherapy was ordered. The patient works 12 hours a day and stands on a concrete floor with work boots and is unable to get the proper compression she requires and cannot elevate her limbs appropriately at any given time. She has recently grown Pseudomonas from her wound culture but has not started her ciprofloxacin which was called in for her. 09/13/15 this continues to be a difficult situation for this patient. At one point I had this wound down to a 1.5 x 1.5" wound on her left leg. This is deteriorated and the right leg has reopened. She now has substantial wounds on her medial calcaneus, malleoli and into her lower leg. One on the left has surface eschar but these are far too painful for me to debridement here. She has a vascular surgery appointment next week to see if anything can be done to help here. I think she has had previous ablations several  years ago at [facility]. She has no major edema. She tells me that she did not get product last time [facility] Ag] and went for several days without it. She continues to work in work boots 12 hours a day. She cannot get compression/4-layer under her work boots. 09/20/15 no major change. Periwound edema control was not very good. Her point with pain and vascular is next Wednesday the 25th 09/28/15; the patient is seen vascular surgery and is apparently scheduled for repeat duplex ultrasounds of her bilateral lower legs next week. 10/05/15; the patient was seen by Dr. Doren Custard of vascular surgery. He feels that she should have arterial insufficiency excluded as cause/contributed to her nonhealing stage she is therefore booked for an arteriogram. She has apparently monophasic signals in the dorsalis pedis pulses. She also of course has known severe chronic venous insufficiency with previous procedures as noted previously. I had another long discussion with the patient today about her continuing to work 12 hour shifts. I've written her out for 2 months area had concerns about this as her work location is currently undergoing significant turmoil and this may lead to her termination.  She is aware of this however I agree with her that she simply cannot continue to stand for 12 hours multiple days a week with the substantial wound areas she has. 10/19/15; the Dr. Doren Custard appointment was largely for an arteriogram which was normal. She does not have an arterial issue. He didn't make a comment about her chronic venous insufficiency for which she has had previous ablations. Presumably it was not felt that anything additional could be done. The patient is now out of work as I prescribed 2 weeks ago. Her wounds look somewhat less aggravated presumably because of this. I felt I would give debridement another try today 10/25/15; no major change in this patient's wounds. We are struggling to get her product that she can afford  into her own home through her insurance. 11/01/15; no major change in the patient's wounds. I have been using silver alginate as the most affordable product. I spoke to Dr. Marla Roe last week with her requested take her to the OR for surgical debridement and placement of ACEL. Dr. Marla Roe told me that she would be willing to do this however [facility] will not cover this, fortunately the patient has [place] healthcare of some variant 11/08/15; no major change in the patient's wounds. She has been completely nonviable surface that this but is in too much pain with any attempted debridement are clinic. I have arranged for her to see Dr. Marla Roe ham of plastic surgery and this appointment is on Monday. I am hopeful that they will take her to the OR for debridement, possible ACEL ultimately possible skin graft 11/22/15 no major change in the patient's wounds over her bilateral medial calcaneus medial malleolus into the lower legs. Surface on these does not look too bad however on the left there is surrounding erythema and tenderness. This may be cellulitis or could him sleepy tinea. 11/29/15; no major changes in the patient's wounds over her bilateral medial malleolus. There is no infection here and I don't think any additional antibiotics are necessary. There is now plan to move forward. She sees Dr. Marla Roe in a week's time for preparation for operative debridement and ACEL placement I believe on 7/12. She then has a follow-up appointment with Dr. Marla Roe on 7/21 12/28/15; the patient returns today having been taken to the [place] by Dr. Marla Roe 12/12/15 she underwent debridement, intraoperative cultures [which were negative]. She had placement of a wound VAC. Parent really ACEL was not available to be placed. The wound VAC foam apparently adhered to the wound since then she's been using silver alginate, Xeroform under Ace wraps. She still says there is a lot of drainage and a lot of  pain 01/31/16; this is a patient I see monthly. I had referred her to Dr. Marla Roe him of plastic surgery for large wounds on her bilateral medial ankles. She has been to the OR twice once in early July and once in early August. She tells me over the last 3 weeks she has been using the wound VAC with ACEL underneath it. On the right we've simply been using silver alginate. Under Kerlix Coban wraps. 02/28/16; this is a patient I'm currently seeing monthly. She is gone on to have a skin graft over her large venous insufficiency ulcer on the left medial ankle. This was done by Dr. Marla Roe him. The patient is a bit perturbed about why she didn't have one on her right medial ankle wound. She has been using silver alginate to this. 03/06/16; I received  a phone call from her plastic surgery Dr. Marla Roe. She expressed some concern about the viability of the skin graft she did on the left medial ankle wound. Asked me to place Endoform on this. She told me she is not planning to do a subsequent skin graft on the right as the left one did not take very well. I had placed Hydrofera Blue on the right 03/13/16; continue to have a reasonably healthy wound on the right medial ankle. Down to 3 mm in terms of size. There is epithelialization here. The area on the left medial ankle is her skin graft site. I suppose the last week this looks somewhat better. She has an open area inferiorly however in the center there appears to be some viable tissue. There is a lot of surface callus and eschar that will eventually need to come off however none of this looked to be infected. Patient states that the is able to keep the dressing on for several days which is an improvement. 03/20/16 no major change in the circumference of either wound however on the left side the patient was at Dr. Eusebio Friendly office and they did a debridement of left wound. 50% of the wound seems to be epithelialized. I been using Endoform on the left  Hydrofera Blue in the right 03/27/16; she arrives today with her wound is not looking as healthy as they did last week. The area on the right clearly has an adherent surface to this a very similar surface on the left. Unfortunately for this patient this is all too familiar problem. Clearly the Endoform is not working and will need to change that today that has some potential to help this surface. She does not tolerate debridement in this clinic very well. She is changing the dressing wants 04/03/16; patient arrives with the wounds looking somewhat better especially on the right. Dr. Migdalia Dk change the dressing to silver alginate when she saw her on Monday and also sold her some compression socks. The usefulness of the latter is really not clear and woman with severely draining wounds. 04/10/16; the patient is doing a bit of an experiment wearing the compression stockings that Dr. Migdalia Dk provided her to her left leg and the out of legs based dressings that we provided to the right. 05/01/16; the patient is continuing to wear compression stockings Dr. Migdalia Dk provided her on the left that are apparently silver impregnated. She has been using Iodoflex to the right leg wound. Still a moderate amount of drainage, when she leaves here the wraps only last for 4 days. She has to change the stocking on the left leg every night 05/15/16; she is now using compression stockings bilaterally provided by Dr. Marla Roe. She is wearing a nonadherent layer over the wounds so really I don't think there is anything specific being done to this now. She has some reduction on the left wound. The right is stable. I think all healing here is being done without a specific dressing 06/09/16; patient arrives here today with not much change in the wound certainly in diameter to large circular wounds over the medial aspect of her ankle bilaterally. Under the light of these services are certainly not viable for healing. There is no  evidence of surrounding infection. She is wearing compression stockings with some sort of silver impregnation as prescribed by Dr. Marla Roe. She has a follow-up with her tomorrow. 06/30/16; no major change in the size or condition of her wounds. These are still probably covered with a  nonviable surface. She is using only her purchase stockings. She did see Dr. Marla Roe who seemed to want to apply Dakin's solution to this I'm not extreme short what value this would be. I would suggest Iodoflex which she still has at home. 07/28/16; I follow Mrs. Depner episodically along with Dr. Marla Roe. 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 She has been applying a topical collagen ointment to both wounds with Adaptic. I don't think Dr. Marla Roe is planning to take her back to the OR. 08/19/16; I follow Mrs. Jeneen Rinks on a monthly basis along with Dr. Marla Roe of plastic surgery. 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 I been following her for a number of years. At one point I was able to get the right medial malleolus wound to heal and had the left medial malleolus down to about half its current size however and I had to send her to plastic surgery for an operative debridement. Since then things have been stable to slightly improve the area on the right is slightly better one in the left about the same although there is much less adherent surface than I'm used to with this patient. She is using some form of liquid collagen gel that Dr. Marla Roe provided a Kerlix cover with the patient's own pressure stockings. She tells me that she has extreme pain in both ankles and along the lateral aspect of both feet. She has been unable to work for some period of time. She is telling me she is retiring at the beginning of April. She sees Dr. Doran Durand of orthopedics next week 09/22/16;  patient has not seen Dr. Marla Roe since the last time she is here. I'm not really sure what she is using to the wounds other than bits and pieces of think she had left over including most recently Hydrofera Blue. She is using juxtalite stockings. She is having difficulty with her husband's recent illness "stroke". She is having to transport him to various doctors appointments. Dr. Marla Roe left her the option of a repeat debridement with ACEL however she has not been able to get the time to follow-up on this. She continues to have a fair amount of drainage out of these wounds with certainly precludes leaving dressings on all week 10/13/16; patient has not seen Dr. Marla Roe since she was last in our clinic. I'm not really sure what she is doing with the wounds, we did try to get her [facility] and I think she is actually using this most of the time. Because of drainage she states she has to change this every second day although this is an improvement from what she used to do. She went to see Dr. Doran Durand who did not think she had a muscular issue with regards to her feet, he referred her to a neurologist and I think the appointment is sometime in June. I changed her back to Iodoflex which she has used in the past but not recently. 11/03/16; the patient has been using Iodoflex although she ran out of this. Still claims that there is a lot of drainage although the wound does not look like this. No surrounding erythema. She has not been back to see Dr. Marla Roe 11/24/16; the patient has been using Iodoflex again but she ran out of it 2 or 3 days ago. There is no major change in the condition of either one of these wounds in  fact they are larger and covered in a thick adherent surface slough/nonviable tissue especially on the left. She does not tolerate mechanical debridement in our clinic. Going back to see Dr. Marla Roe of plastic surgery for an operative debridement would seem reasonable. 12/15/16;  the patient has not been back to see Dr. Marla Roe. She is been dealing with a series of illnesses and her husband which of monopolized her time. She is been using Sorbact which we largely supplied. She states the drainage is bad enough that it maximum she can go 2-3 days without changing the dressing 01/12/2017 -- the patient has not been back for about 4 weeks and has not seen Dr. Marla Roe not does she have any appointment pending. 01/23/17; patient has not seen Dr. Marla Roe even though I suggested this previously. She is using Santyl that was suggested last week by Dr. Con Memos this Cost her $16 through her insurance which is indeed surprising 02/12/17; continuing Santyl and the patient is changing this daily. A lot of drainage. She has not been back to see plastic surgery she is using an Ace wrap. Our intake nurse suggested wrap around stockings which would make a good reasonable alternative 02/26/17; patient is been using Santyl and changing this daily due to drainage. She has not been to see plastic surgery she uses in April Ace wrap to control the edema. She did obtain extremitease stockings but stated that the edema in her leg was to big for these 03/20/17; patient is using Santyl and Anasept. Surfaces looked better today the area on the right is actually measuring a little smaller. She has states she has a lot of pain in her feet and ankles and is asking for a consult to pain control which I'll try to help her with through our case manager. 04/10/17; the patient arrives with better-looking wound surfaces and is slightly smaller wound on the left she is using a combination of Santyl and Anasept. She has an appointment or at least as started in the pain control center associated with [facility] 05/14/17; this is a patient who I followed for a prolonged period of time. She has venous insufficiency ulcers on her bilateral medial ankles. At one point I had this down to a much smaller wound on  the left however these reopened and we've never been able to get these to heal. She has been using Santyl and Anasept gel although 2 weeks ago she ran out of the Anasept gel. She has a stable appearance of the wound. She is going to the wound care clinic at [facility] They wanted do a nerve block/spinal block although she tells me she is reluctant to go forward with that. 05/21/17; this is a patient I have followed for many years. She has venous insufficiency ulcers on her bilateral medial ankles. Chronic pain and deformity in her ankles as well. She is been to see plastic surgery as well as orthopedics. Using PolyMem AG most recently/Kerramax/ABDs and 2 layer compression. She has managed to keep this on and she is coming in for a nurse check to change the dressing on Tuesdays, we see her on Fridays 06/05/17; really quite a good looking surface and the area especially on the right medial has contracted in terms of dimensions. Well granulated healthy-looking tissue on both sides. Even with an open curet there is nothing that even feels abnormal here. This is as good as I've seen this in quite some time. We have been using PolyMem AG and bringing her  in for a nurse check 06/12/17; really quite good surface on both of these wounds. The right medial has contracted a bit left is not. We've been using PolyMem and AG and she is coming in for a nurse visit 06/19/17; we have been using PolyMem AG and bringing her in for a nurse check. Dimensions of her wounds are not better but the surfaces looked better bilaterally. She complained of bleeding last night and the left wound and increasing pain bilaterally. She states her wound pain is more neuropathic than just the wounds. There was some suggestion that this was radicular from her pain management doctor in talking to her it is really difficult to sort this out. 06/26/17; using PolyMem and AG and bringing her in for a nurse check as All of this and reasonably  stable condition. Certainly not improved. The dimensions on the lateral part of the right leg look better but not really measuring better. The medial aspect on the left is about the same. 07/03/16; we have been using PolyMen AG and bringing her in for a nurse check to change the dressings as the wounds have drainage which precludes once weekly changing. We are using all secondary absorptive dressings.our intake nurse is brought up the idea of using a wound VAC/snap VAC on the wound to help with the drainage to see if this would result in some contraction. This is not a bad idea. The area on the right medial is actually looking smaller. Both wounds have a reasonable-looking surface. There is no evidence of cellulitis. The edema is well controlled 07/10/17; the patient was denied for a snap VAC by her insurance. The major issue with these wounds continues to be drainage. We are using wicked PolyMem AG and she is coming in for a nurse visit to change this. The wounds are stable to slightly improved. The surface looks vibrant and the area on the right certainly has shrunk in size but very slowly 07/17/17; the patient still has large wounds on her bilateral medial malleoli. Surface of both of these wounds looks better. The dimensions seem to come and go but no consistent improvement. There is no epithelialization. We do not have options for advanced treatment products due to insurance issues. They did not approve of the wound VAC to help control the drainage. More recently we've been using PolyMem and AG wicked to allow drainage through. We have been bringing her in for a nurse visit to change this. We do not have a lot of options for wound care products and the home again due to insurance issues 07/24/17; the patient's wound actually looks somewhat better today. No drainage measurements are smaller still healthy-looking surface. We used silver collagen under PolyMen started last week. We have been bringing her  in for a dressing change 07/31/17; patient's wound surface continued to look better and I think there is visible change in the dimensions of the wound on the right. Rims of epithelialization. We have been using silver collagen under PolyMen and bringing her in for a dressing change. There appears to be less drainage although she is still in need of the dressing change 08/07/17. Patient's wound surface continues to look better on both sides and the area on the right is definitely smaller. We have been using silver collagen and PolyMen. She feels that the drainage has been it has been better. I asked her about her vascular status. She went to see Dr. Aleda Grana at [facility] and had some form of ablation. I  don't have much detail on this. I haven't my notes from 2016 that she was not a candidate for any further ablation but I don't have any more information on this. We had referred her to vein and vascular I don't think she ever went. He does not have a history of PAD although I don't have any information on this either. We don't even have ABIs in our record 08/14/17; we've been using silver collagen and PolyMen cover. And putting the patient and compression. She we are bringing her in as a nurse visit to change this because ofarge amount of drainage. We didn't the ABIs in clinic today since they had been done in many moons 1.2 bilaterally. She has been to see vein and vascular however this was at [facility] and she had ablation although I really don't have any information on this all seemed biking get a report. She is also been operatively debrided by plastic surgery and had a cell placed probably 8-12 months ago. This didn't have a major effect. We've been making some gains with current dressings 08/19/17-She is here in follow-up evaluation for bilateral medial malleoli ulcers. She continues to tolerate debridement very poorly. We will continue with recently changed topical treatment; if no  significant improvement may consider switching to Iodosorb/Iodoflex. 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 She has been using silver collagen and PolyMem. I believe she has been used and tried on Iodoflex before. 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 These have been very refractory over time. We have been using silver collagen and PolyMen. She is coming in once a week for a doctor's and once a week for nurse visits. We are actually making some progress 09/18/17; the patient's wounds are smaller especially on the right medial. She arrives today to upset to consider even washing these off with Anasept which I think is been part of the reason this is been closing. We've been using collagen covered in PolyMen otherwise. It is noted that she has a small area of folliculitis on the right medial calf that. As we are wrapping her legs I'll give her a short course of doxycycline to make sure this doesn't amount to anything. She is a long list of complaints today including imbalance, shortness of breath on exertion, inversion of her left ankle. With regards to the latter complaints she is been to see orthopedics and they offered her a tendon release surgery I believe but wanted her wounds to be closed first. I have recommended she go see her primary physician with regards to everything else. 09/25/17; patient's wounds are about the same size. We have made some progress bilaterally although not in recent weeks. She will not allow me T wash these wounds with Anasept even if she is doing her cell. Wheeze we've been using collagen covered in PolyMen. Last week she had a small area of folliculitis this is now opened into a small wound. She completed 5 days of trimethoprim  sulfamethoxazole 10/02/17; unfortunately the area on her left medial ankle is worse with a larger wound area towards the Achilles. The patient complains of a lot of pain. She will not allow debridement although visually I don't think there is anything to debridement in any case. We have been using silver collagen and  PolyMen for several months now. Initially we are making some progress although I'm not really seeing that today. We will move back to [facility] His admittedly this is a bit of a repeat however I'm hoping that his situation is different now. The patient tells me she had her leg on the left give out on her yesterday this is process some pain. 10/09/17; the patient is seen twice a week largely because of drainage issues coming out of the chronic medial bimalleolar wounds that are chronic. Last week the dimensions of the one on the left looks a little larger I changed her to [facility]. She comes in today with a history of terrible pain in the bilateral wound areas. She will not allow debridement. She will not even allow a tissue culture. There is no surrounding erythema no no evidence of cellulitis. We have been putting her Kerlix Coban man. She will not allow more aggressive compression as there was a suggestion to put her in 3 layer wraps. 10/16/17; large wounds on her bilateral medial malleoli. These are chronic. Not much change from last week. The surface looks have healthy but absolutely no epithelialization. A lot of pain little less so of drainage. She will not allow debridement or even washing these off in the vigorous fashion with Anasept. 10/23/17; large wounds on her bilateral malleoli which are chronic. Some improvement in terms of size perhaps on the right since last time I saw these. She states that after we increased the 3 layer compression there was some bleeding, when she came in for a nurse visit she did not want 3 layer compression put back on about our nurse managed  to convince her. She has known chronic venous visit issues and I'm hoping to get her to tolerate the 3 layer compression. using Hydrofera Blue 10/30/17; absolutely no change in the condition of either wound although we've had some improvement in dimensions on the right.. Attempted to put her in 3 layer compression she didn't tolerated she is back in 2 layer compression. We've been using Hydrofera Blue We looked over her past records. She had venous reflux studies in November 2016. There was no evidence of deep venous reflux on the right. Superficial vein did not show the greater saphenous vein at think this is been previously ablated the small saphenous vein was within normal limits. The left deep venous system showed no DVT the vessels were positive for deep venous reflux in the posterior tibial veins at the ankle. The greater saphenous vein was surgically absent small saphenous vein was within normal limits. She went to vein and vascular at [facility]. I believe she had an ablation on the left greater saphenous vein. I'll update her reflux studies perhaps ever reviewed by vein and vascular. We've made absolutely no progress in these wounds. Will also try to read and TheraSkins through her insurance 11/06/17; W the patient apparently has a 2 week follow-up with vein and vascular I like him to review the whole issue with regards to her previous vascular workup by Dr. Aleda Grana. We've really made no progress on these wounds in many months. She arrives today with less viable looking surface on the left medial ankle wound. This was apparently looking about the same on Tuesday when she was here for nurse visit. 11/13/17; deep tissue culture I did last time of the left lower leg showed multiple organisms without any predominating. In particular no Staphylococcus or group A strep were isolated. We sent her for venous  reflux studies. She's had a previous left greater saphenous vein stripping and I think  sclerotherapy of the right greater saphenous vein. She didn't really look at the lesser saphenous vein this both wounds are on the medial aspect. She has reflux in the common femoral vein and popliteal vein and an accessory vein on the right and the common femoral vein and popliteal vein on the left. I'm going to have her go to see vein and vascular just the look over things and see if anything else beside aggressive compression is indicated here. We have not been able to make any progress on these wounds in spite of the fact that the surface of the wounds is never look too bad. 11/20/17; no major change in the condition of the wounds. Patient reports a large amount of drainage. She has a lot of complaints of pain although enlisting her today I wonder if some of this at least his neuropathic rather than secondary to her wounds. She has an appointment with vein and vascular on 12/30/17. The refractory nature of these wounds in my mind at least need vein and vascular to look over the wounds the recent reflux studies we did and her history to see if anything further can be done here. I also note her gait is deteriorated quite a bit. 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 She has a bilateral Trendelenburg gait. I wonder if this is neuropathic or perhaps multilevel radicular. 11/27/17; her wounds actually looks slightly better. Healthy-looking granulation tissue a scant amount of epithelialization. [place] healthcare will not pay for [employer]. They will play for tri layer Oasis and Dermagraft. This is not a diabetic ulcer. We'll try for the tri layer Oasis. She still complains of some drainage. She has a vein and vascular appointment on 12/30/17 12/04/17; the wounds visually look quite good. Healthy-looking granulation with some degree of epithelialization. We are still waiting for response to our request for trial to try layer Oasis. Her appointment with vascular to review venous and arterial  issues isn't sold the end of July 7/31. Not allow debridement or even vigorous cleansing of the wound surface. 12/18/17; slightly smaller especially on the right. 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 We've been using Hydrofera Blue. We still are looking into triple layer Oasis through her insurance 01/08/18 on evaluation today patient's wound actually appears to be showing signs of good improvement at this point in time. She has been tolerating the dressing changes without complication. Fortunately there does not appear to be any evidence of infection at this point in time. We have been utilizing silver nitrate which does seem to be of benefit for her which is also good news. Overall I'm very happy with how things seem to be both regards appearance as well as measurement. Patient did see Dr. Bridgett Larsson for evaluation on 12/30/17. In his assessment he felt that stripping would not likely add much more than chronic compression to the patient's healing process. His recommendation was to follow-up in three months with Dr. Doren Custard if she hasn't healed in order to consider referral back to you and see vascular where she previously was in a trial and was able to get her wound to heal. I'll be see what she feels she when you staying compression and he reiterated this as well. 01/13/18 on evaluation today patient appears to actually be doing very well in regard to her bilateral medial malleolus ulcers. She seems to have tolerated the chemical cauterization  with silver nitrate last week she did have some pain through that evening but fortunately states that I'll be see since it seems to be doing better she is overall pleased with the progress. 01/21/18; really quite a remarkable improvement since I've last seen these wounds. We started using silver nitrate specially on the islands of hyper granulation which for some reason her around the wound circumference. This is really done quite nicely.  Primary dressing Hydrofera Blue under 4 layer compression. She seems to be able to hold out without a nurse rewrap. Follow-up in 1 week 01/28/18; we've continued the hydrofera blue but continued with chemical cauterization to the wound area that we started about a month ago for irregular hyper granulation. She is made almost stunning improvement in the overall wound dimensions. I was not really expecting this degree of improvement in these chronic wounds 02/05/18; we continue with Hydrofera Bluebut of also continued the aggressive chemical cauterization with silver nitrate. We made nice progress with the right greater than left wound. 02/12/18. We continued with Hydrofera Blue after aggressive chemical cauterization with silver nitrate. We appear to be making nice progress with both wound areas 02/19/2018; we continue with [facility] after washing the wounds vigorously with Anasept spray and chemical cauterization with silver nitrate. We are making excellent progress. The area on the right's just about closed 02/26/2018. The area on the left medial ankle had too much necrotic debris today. I used a #5 curette we are able to get most of the soft. I continued with the silver nitrate to the much smaller wound on the right medial ankle she had a new area on her right lower pretibial area which she says was due to a role in her compression 03/05/2018; both wound areas look healthy. Not much change in dimensions from last week. I continue to use silver nitrate and Hydrofera Blue. The patient saw Dr. Doren Custard of vein and vascular. He felt she had venous stasis ulcers. He felt based on her previous arteriogram she should have adequate circulation for healing. Also she has deep venous reflux but really no significant correctable superficial venous reflux at this time. He felt we should continue with conservative management including leg elevation and compression 04/02/2018; since we last saw this woman about a  month ago she had a fall apparently suffered a pelvic fracture. I did not look up the x-ray. Nevertheless because of pain she literally was bedbound for 2 weeks and had home health coming out to change the dressing. Somewhat predictably this is resulted in considerable improvement in both wound areas. The right is just about closed on the medial malleolus and the left is about half the size. 04/16/2018; both her wounds continue to go down in size. Using Hydrofera Blue. 05/07/18; both her wounds appeared to be improving especially on the right where it is almost closed. We are using Hydrofera Blue 05/14/2018; slightly worse this week with larger wounds. Surface on the left medial not quite as good. We have been using Hydrofera Blue 05/21/18; again the wounds are slightly larger. Left medial malleolus slightly larger with eschar around the circumference. We have been using Hydrofera Blue undergoing a wraps for a prolonged period of time. This got a lot better when she was more recumbent due to a fall and a back injury. I change the primary dressing the silver alginate today. She did not tolerate a 4 layer compression previously although I may need to bring this up with her next time 05/28/2018; area  on the left medial malleolus again is slightly larger with more drainage. Area on the right is roughly unchanged. She has a small area of folliculitis on the right medial just on the lower calf. This does not look ominous. 06/03/2018 left medial malleolus slightly smaller in a better looking surface. We used silver nitrate on this last time with silver alginate. The area on the right appears slightly smaller 1/10; left medial malleolus slightly smaller. Small open area on the right. We used silver nitrate and silver alginate as of 2 weeks ago. We continue with the wound and compression. These got a lot better when she was off her feet 1/17; right medial malleolus wound is smaller. The left may be slightly  smaller. Both surfaces look somewhat better. 1/24; both wounds are slightly smaller. Using silver alginate under Unna boots 1/31; both wounds appear smaller in fact the area on the right medial is just about closed. Surface eschar. We have been using silver alginate under Unna boots. The patient is less active now spends let much less time on her feet and I think this is contributed to the general improvement in the wound condition 2/7; both wounds appear smaller. I was hopeful the right medial would be closed however there there is still the same small open area. Slight amount of surface eschar on the left the dimensions are smaller there is eschar but the wound edges appear to be free. We have been using silver alginate under Unna boot's 2/14; both wounds once again measure smaller. Circumferential eschar on the left medial. We have been using silver alginate under Unna boots with gradual improvement 2/21; the area on the right medial malleolus has healed. The area on the left is smaller. We have been using silver alginate and Unna boots. We can discharge wrapping the right leg she has 20/30 stockings at home she will need to protect the scar tissue in this area 2/28; the area on the right medial malleolus remains closed the patient has a compression stocking. The area on the left is smaller. We have been using silver alginate and Unna boots. 3/6 the area on the right medial ankle remains closed. Good edema control noted she is using her own compression stocking. The area on the left medial ankle is smaller. We have been managing this with silver alginate and Unna boots which we will continue today. 3/13; the area on the right medial ankle remains closed and I'm declaring it healed today. When necessary the left is about the same still a healthy-looking surface but no major change and wound area. No evidence of infection and using silver alginate under unna and generally making considerable  improvement 3/27 the area on the right medial ankle remains closed the area on the left is about the same as last week. Certainly not any worse we have been using silver alginate under an Unna boot 4/3; the area on the right medial ankle remains closed per the patient. We did not look at this wound. The wound on the left medial ankle is about the same surface looks healthy we have been using silver alginate under an Unna boot 4/10; area on the right medial ankle remains closed per the patient. We did not look at this wound. The wound on the left medial ankle is slightly larger. The patient complains that the [facility] caused burning pain all week. She also told us that she was a lot more active this week. Changed her back to silver alginate 4/17;  right medial ankle still closed per the patient. Left medial ankle is slightly larger. Using silver alginate. She did not tolerate Hydrofera Blue on this area 4/24; right medial ankle remains closed we have not look at this. The left medial ankle continues to get larger today by about a centimeter. We have been using silver alginate under Unna boots. She complains about 4 layer compression as an alternative. She has been up on her feet working on her garden 5/8; right medial ankle remains closed we did not look at this. The left medial ankle has increased in size about 100%. We have been using silver alginate under Unna boots. She noted increased pain this week and was not surprised that the wound is deteriorated 5/15; no major change in SA however much less erythema ( one week of doxy ocellulitis). 5/22-66 year old female returns at 1 week to the clinic for left medial ankle wound for which we have been using silver alginate under 3 layer compression She was placed on DOXY at last visit - the wound is wider at this visit. She is in 3 layer compression 5/29; change to [facility] last week. I had given her empiric doxycycline 2 weeks ago for a week.  She is in 3 layer compression. She complains of a lot of pain and drainage on presentation today. 6/5; using Hydrofera Blue. I gave her doxycycline recently empirically for erythema and pain around the wound. Believe her cultures showed enterococcus which not would not have been well covered by doxycycline nevertheless the wound looks better and I don't feel specifically that the enterococcus needs to be covered. She has a new what looks like a wrap injury on her lateral left ankle. 6/12; she is using Hydrofera Blue. She has a new area on the left anterior lower tibial area. This was a wrap injury last week. 6/19; the patient is using Hydrofera Blue. She arrived with marked inflammation and erythema around the wound and tenderness. 12/01/18 on evaluation today patient appears to be doing a little bit better based on what I'm hearing from the standpoint of lassos evaluation to this as far as the overall appearance of the wound is concerned. Then sometime substandard she typically sees Dr. Dellia Nims. Nonetheless overall very pleased with the progress that she's made up to this point. No fevers, chills, nausea, or vomiting noted at this time. 7/10; some improvement in the surface area. Aggressively debrided last week apparently. I went ahead with the debridement today although the patient does not tolerate this very well. We have been using Iodoflex. Still a fair amount of drainage 7/17; slightly smaller. Using Iodoflex. 7/24; no change from last week in terms of surface area. We have been using Iodoflex. Surface looks and continues to look somewhat better 7/31; surface area slightly smaller better looking surface. We have been using Iodoflex. This is under Unna boot compression 8/7-Patient presents at 1 week with Unna boot and Iodoflex, wound appears better 8/14-Patient presents at 1 week with Iodoflex, we use the Unna boot, wound appears to be stable better.Patient is getting Botox treatment for the  inversion of the foot for tendon release, Next week 8/21; we are using Iodoflex. Unna boot. The wound is stable in terms of surface area. Under illumination there is some areas of the wound that appear to be either epithelialized or perhaps this is adherent slough at this point I was not really clear. It did not wipe off and I was reluctant to debride this today. 8/28; we are  using Iodoflex in an [employer]. Seems to be making good improvement. 9/4; using Iodoflex and wound is slightly smaller. 9/18; we are using Iodoflex with topical silver nitrate when she is here. The wound continues to be smaller 10/2; patient missed her appointment last week due to GI issues. She left and Iodoflex based dressing on for 2 weeks. Wound is about the same size about the size of a dime on the left medial lower 10/9 we have been using Iodoflex on the medial left ankle wound. She has a new superficial probable wrap injury on the dorsal left ankle 10/16; we have been using Hydrofera Blue since last week. This is on the left medial ankle 10/23; we have been using Hydrofera Blue since 2 weeks ago. This is on the left medial ankle. Dimensions are better 11/6; using Hydrofera Blue. I think the wound is smaller but still not closed. Left medial ankle 11/13; we have been using Hydrofera Blue. Wound is certainly no smaller this week. Also the surface not as good. This is the remanent of a very large area on her left medial ankle. 11/20; using Sorbact since last week. Wound was about the same in terms of size although I was disappointed about the surface debris 12/11; 3-week follow-up. Patient was on vacation. Wound is measuring slightly larger we have been using Sorbact. 12/18; wound is about the same size however surface looks better last week after debridement. We have been using Sorbact under compression 1/15 wound is probably twice the size of last time increased in length nonviable surface. We have been using Sorbact.  She was running a mild fever and missed her appointment last week 1/22; the wound is come down in size but under illumination still a very adherent debris we have been Hydrofera Blue that I changed her to last week 1/29; dimensions down slightly. We have been using Hydrofera Blue 2/19 dimensions are the same however there is rims of epithelialization under illumination. Therefore more the surface area may be epithelialized 2/26; the patient's wound actually measures smaller. The wound looks healthy. We have been using Hydrofera Blue. I had some thoughts about running Apligraf then I still may do that however this looks so much better this week we will delay that for now 3/5; the wound is small but about the same as last week. We have been using Hydrofera Blue. No debridement is required today. 3/19; the wound is about the size of a dime. Healthy looking wound even under illumination. We have been using Hydrofera Blue. No mechanical debridement is necessary 3/26; not much change from last week although still looks very healthy. We have been using Hydrofera Blue under Unna boots Patient was offered an ankle fusion by podiatry but not until the wound heals with a proceed with this. 4/9; the patient comes in today with her original wound on the medial ankle looking satisfactory however she has some uncontrolled swelling in the middle part of her leg with 2 new open areas superiorly just lateral to the tibia. I think this was probably a wrap issue. She said she felt uncomfortable during the week but did not call in. We have been using Hydrofera Blue 4/16; the wound on the medial ankle is about the same. She has innumerable small areas superior to this across her mid tibia. I think this is probably folliculitis. She is also been working in the yard doing a lot of sweating 4/30; the patient issue on the upper areas across her mid tibia  of all healed. I think this was excessive yard work if I remember. Her  wound on the medial ankle is smaller. Some debris on this we have been using Hydrofera Blue under Unna boots 5/7; mid tibia. She has been using Hydrofera Blue under an Unna wrap. She is apparently going for her ankle surgery on June 3 10/28/19-Patient returns to clinic with the ankle wound, we are using Hydrofera Blue under Unna wrap, surgery is scheduled for her left foot for June 3 so she will be back for nurse visit next week READMISSION 01/17/2020 Mrs. Hayse is a 66 year old woman we have had in this clinic for a long period of time with severe venous hypertension and refractory wounds on her medial lower legs and ankles bilaterally. 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 When she was off her legs for a prolonged period example when she fell and suffered a compression fracture things would heal up quite nicely. She is now retired and we managed to heal up the right medial leg wound. The left one was very tiny last time I saw this although still refractory. 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 It got to the point where this was interfering with her walking and she elected to proceed with a ankle arthrodesis to straighten her her ankle and leave her with a functional outcome for mobilization. The patient was referred to Dr. Doren Custard and really this took some time to arrange. Dr. Doren Custard saw her on 12/07/2019. Once again he verified that she had no arterial issues. She had previously had an angiogram several years ago. Follow-up ABIs on the left showed an ABI of 1.12 with triphasic waveforms and a TBI of 0.92. She is felt to have chronic deep venous insufficiency but I do not think it was felt that anything could be done from about this from an ablation point of view. At the time Dr.  Doren Custard saw this patient the wounds actually look closed via the pictures in his clinic. The patient finally underwent her surgery on 12/15/2019. This went reasonably well and there was a good anatomic outcome. She developed a small distal wound dehiscence on the lateral part of the surgical wound. However more problematically she is developed recurrence of the wound on the medial left ankle. There are actually 2 wounds here one in the distal lower leg and 1 pretty much at the level of the medial malleolus. It is a more distal area that is more problematic. She has been using Hydrofera Blue which started on Friday before this she was simply Ace wrapping. There was a culture done that showed Pseudomonas and she is on ciprofloxacin. A recent CNS on 8/11 was negative. The patient reports some pain but I generally think this is improving. She is using a cam boot completely nonweightbearing using a walker for pivot transfers and a wheelchair 8/24; not much improvement unfortunately she has a surgical wound on the lateral part in the venous insufficiency wound medially. The bottom part of the medial insufficiency wound is still necrotic there is exposed tendon here. We have been using Hydrofera Blue under compression. Her edema control is however better 8/31; patient in for follow-up of his surgical wound on the lateral part of her left leg and chronic venous insufficiency ulcers  medially. We put her back in compression last week. She comes in today with a complaint of 3 or 4 days worth of increasing pain. She felt her cam walker was rubbing on the area on the back of her heel. However there is intense erythema seems more likely she has cellulitis. She had 2 cultures done when she was seeing podiatry in the postop. One of them in late July showed Pseudomonas and she received a course of ciprofloxacin the other was negative on 8/11 she is allergic to penicillin with anaphylactoid complaints of hives oral  swelling via information in epic 9/9; when I saw this patient last week she had intense anterior erythema around her wound on the right lateral heel and ankle and also into the right medial heel. Some of this was no doubt drainage and her walker boot however I was convinced she had cellulitis. I gave her Levaquin and Bactrim she is finishing up on this now. She is following up with Dr. [name] he saw her yesterday. He is taken her out of the walking boot of course she is still nonweightbearing. Her x-ray was negative for any worrisome features such as soft tissue air etc. Things are a lot better this week. She has home health. We have been using Hydrofera Blue under an [employer] which she put back on yesterday. I did not wrap her last week 9/17; her surrounding skin looks a lot better. In fact the area on the left lateral ankle has just a scant amount of eschar. The only remaining wound is the large area on the left medial ankle. Probably about 60% of this is healthy granulation at the surface however she has a significant divot distally. This has adherent debris in it. I been using debridement and silver collagen to try and get this area to fill-in although I do not think we have made much progress this week 9/24; the patient's wound on the left medial ankle looks a lot better. The deeper divot area distally still requires debridement but this is cleaning up quite nicely we have been using silver collagen. The patient is complaining of swelling in her foot and is worried that that is contributing to the nonhealing of the ankle wound. She is also complaining of numbness in her anterior toes 10/4; left medial ankle. The small area distally still has a divot with necrotic material that I have been debriding away. This has an undermining area. She is approved for Apligraf. She saw Dr. [name] her surgeon on 10/1. I think he declared himself is satisfied with the condition of things. Still nonweightbearing  till the end of the month. 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 Her surgical wound is well closed. There is no evidence of infection 10/11; the patient arrived in clinic today with the expectation that we be able to put an Apligraf on this area after debridement however she arrives with a relatively large amount of green drainage on the dressing. The patient states that this started on Friday. She has not been systemically unwell. 10/19; culture I did last week showed both Enterococcus and Pseudomonas. I think this came in separate parts because I stopped her ciprofloxacin I gave her and prescribed her linezolid however now looking at the final culture result this is Pseudomonas which is resistant to quinolones. She has not yet picked up the linezolid apparently phone issues. We are also trying to get a topical antibiotic out of [place] in [place]  they can be applied by home health. She is still having green drainage 10/16; the patient has her topical antibiotic from [facility] in [place]. This is a compounded gel with vancomycin and ciprofloxacin and gentamicin. We are applying this on the wound bed with silver alginate over the top with Unna boot wraps. She arrives in clinic today with a lot less ominous looking drainage although she is only use this topical preparation once the second time today. She sees Dr. [name] her surgeon on Friday she has home health changing the dressing 11/2; still using her compounded topical antibiotic under silver alginate. Surface is cleaning up there is less drainage. We had an Apligraf for her today and I elected to apply it. A light coating of her antibiotic 04/25/2020 upon evaluation today patient appears to be doing well with regard to her ankle ulcer. There is a little bit of slough noted on the surface of the wound I am can have to perform sharp debridement to clear this away today. With that being said other  than that fact overall I feel like she is making progress and we do see some new epithelial growth. There is also some improvement in the depth of the wound and that distal portion. There is little bit of slough there as well. 12/7; 2-week follow-up. Apligraf #3. Dimensions are smaller. Closing in especially inferiorly. Still some surface debris. Still using the [facility] topical antibiotic but I told her that I don't think this needs to be renewed 12/21; 2-week follow-up. Apligraf #4 dimensions are smaller. Nice improvement 06/05/2020; 2-week follow-up. The patient's wound on the left medial ankle looks really excellent. Nice granulation. Advancing epithelialization no undermining no evidence of infection. We would have to reapply for another Apligraf but with the condition of this wound I did not feel strongly about it. We used Hydrofera Blue under the same degree of compression. She follows up with Dr. [name] her surgeon a week Friday 06/13/2020 upon evaluation today patient appears to be doing excellent in regard to her wound. She has been tolerating the dressing changes without complication. Fortunately there is no signs of active infection at this time. No fevers, chills, nausea, vomiting, or diarrhea. She was using Hydrofera Blue last week. 06/20/2020 06/20/2020 on evaluation today patient actually appears to be doing excellent in regard to her wound. This is measuring better and looking much better as well. She has been using the collagen that seems to be doing better for her as well even though the [facility] was and is not sticking or feeling as rough on her wound. She did see Dr. [name] on Friday he is very pleased he also stated none of the hardware has shifted. That is great news 1/27; the patient has a small clean wound all that is remaining. I agree that this is too small to really consider an Apligraf. Under illumination the surface is looking quite good. We have been using collagen  although the dimensions are not any better this week 2/2; the patient has a small clean wound on the left medial ankle. Although this left of her substantial original areas. Measurements are smaller. We have been using polymen Ag under an Haematologist. 2/10; small area on the left medial ankle. This looks clean nothing to debride however dimensions are about the same we have been using polymen I think now for 2 weeks 2/17; not much change in surface area. We have been using polymen Ag without any improvement. 3/17; 1 month follow-up.  The patient has been using endoform without any improvement in fact I think this looks worse with more depth and more expansion 3/24; no improvement. Perhaps less debris on the surface. We have been using Sorbact for 1 week 4/4; wound measures larger. She has edema in her leg and her foot which she tells as her wrap came down. We have been using Unna boots. Sorbact of the wound. She has been approved for Apligraf 09/12/2020 upon evaluation today patient appears to be doing well with regard to her wound. We did get the Apligraf reapproved which is great news we have that available for application today. Fortunately there is no signs of infection and overall the patient seems to be doing great. The wound bed is nice and clean. 4/27; patient presents for her second application of Apligraf. She states over the past week she has been on her feet more often due to being outside in her garden. She has noted more swelling to her foot as a result. She denies increased warmth, pain or erythema to the wound site. 10/10/2020 upon evaluation today patient appears to be doing well with regard to her wound which does not appear to be quite as irritated as last week from what I am hearing. With that being said unfortunately she is having issues with some erythema and warmth to touch as well as an increase size. I do believe this likely is infected. 10/17/2020 upon evaluation today patient  appears to be doing excellent in regard to her wound this is significantly improved as compared to last week. Fortunately I think that the infection is much better controlled at this point. She did have evidence of both Enterococcus as well as Staphylococcus noted on culture. Enterococcus really would not be helped significantly by the Cipro but the wound is doing so much better I am under the assumption that the Staphylococcus is probably the main organism that is causing the current infection. Nonetheless I think that she is doing excellent as far as that is concerned and I am very pleased in that regard. I would therefore recommend she continue with the Cipro. 10/31/2020 upon evaluation today patient appears to be doing well with regard to her wound. She has been tolerating the dressing changes without complication. Fortunately there is no signs of active infection and overall I am extremely pleased with where things stand today. No fevers, chills, nausea, vomiting, or diarrhea. With that being said she does have some green drainage coming from the wound and although it looks okay I am a little concerned about the possibility of a continuing infection. Specifically with Pseudomonas. For that reason I will go ahead and send in a prescription for Cipro for her to be continued. 11/14/2020 upon evaluation today patient appears to be doing very well currently in regard to her wound on her leg. She has been tolerating the dressing changes without complication. Fortunately I feel like the infection is finally under good control here. Unfortunately we do not have the Apligraf for application today although we can definitely order to have it in place for next week. That will be her fifth and final of the current series. Nonetheless I feel like her wound is really doing quite well which is great news. 11/21/2020 upon evaluation today patient appears to be doing well with regard to her wound on the medial ankle.  Fortunately I think the infection is under control and I do believe we can go ahead and reapply the Apligraf today. She is  in agreement with that plan. There does not appear to be any signs of active infection at this time which is great news. No fevers, chills, nausea, vomiting, or diarrhea. 12/05/2020 upon evaluation today patient's wound bed actually showed signs of good granulation epithelization at this point. There does not appear to be any signs of infection which is great news and overall very pleased with where things stand. Overall the patient seems to be doing fairly well in my opinion with regard to her wound although I do believe she continues to build up a lot of biofilm I think she could benefit from using PuraPly at this point. 12/12/2020 upon evaluation today patient's wound actually appears to be doing decently well today. The Unna boot has not been quite as well-tolerated so that more uncomfortable for her and even causing some pressure over the plate on the lateral portion of her foot which is 90 where the wound is. There did not appear to be any significant deep tissue injury with that there may be a minimal change in the skin noted I think that we may want to go back to the Coflex 2 layer which is a little bit easier on her skin it seems. 12/19/2020 upon evaluation today patient actually seems to be making great progress with the PuraPly currently. She in fact seems to be much better as far as the overall appearance of the wound bed is concerned I am very happy in this regard. I do not see any signs of of infection which is great news as well. No fevers, chills, nausea, vomiting, or diarrhea. 12/26/2020 upon evaluation today patient appears to actually be doing better in regard to her wound on the left medial ankle region. The surface of the wound is actually doing significantly better which is great news. There does not appear to be any signs of infection which is also great news and  in general I am extremely pleased with where we stand today. 01/02/2021 upon evaluation today patient appears to be doing well with regard to her wound. In fact this is showing signs of excellent improvement and very pleased with where things stand. In fact the last 3 appointments have all shown signs of this getting smaller which is excellent news. I have not even had to perform any debridement and today is no exception. Overall I feel like this is dramatically improved compared to previous. T oday is PuraPly application #4. 09/08/8117 upon evaluation today patient appears to be doing excellent in regard to her wound this is continue to show signs of improvement and overall I am extremely pleased with where we stand today. She is actually here for PuraPly application #5. Every time we have applied this she is noted definite improvement on measurements. 01/23/2021 upon evaluation today patient is actually making good progress in regard to her wound. This was actually on just a little bit longer this time compared to previous due to the fact that she did have to go out of town. She is actually here for PuraPly application #6. We have definitely been seeing improvements in the overall quality of the tissue on the surface of the wound which is awesome news. In general I think that the patient seems to be continuing to make great progress here. 01/30/2021 upon evaluation today patient's wound is actually doing excellent. There is really not any significant biofilm buildup which is great news and overall I am extremely pleased with where things stand today. There does not appear to  be any signs of active infection. No fevers, chills, nausea, vomiting, or diarrhea. 02/06/2021 upon evaluation today patient's wound is actually showing signs of excellent improvement which is great news. We continue to see the benefit of the PuraPly this is doing a great job the wound seems not really irritated whatsoever and is  showing signs of good granulation at this point. Overall I am extremely happy with what we are seeing. The patient likewise is happy to hear all of this as well. 02/13/2021 upon evaluation today patient appears to be doing well with regard to her wound. This again is measuring smaller today and I am very pleased in this regard. Fortunately there does not appear to be any signs of active infection at this time which is good news from a systemic standpoint. Locally there is still some significant drainage which she does have is concerned about infection locally. No fevers, chills, nausea, vomiting, or diarrhea. 02/20/2021 upon evaluation today patient actually appears to be doing decently well in regard to her wound. She has been tolerating the dressing changes without complication. I do believe the PuraPly is helping wound bed does appear to be doing better. There is no evidence of active infection locally or systemically at this point visually although on fluorescence imaging there still appear to be bacterial load and bioburden noted. 02/27/2021 upon evaluation today patient fortunately continues to show signs of improvement with use of the PuraPly currently. Subsequently we did review her culture results and to be honest I think that the Bactrim is probably the best option to have her continue at this point. For that reason I am get a go ahead and send in a refill today for her for an additional 2 weeks. Nonetheless I think that we are making excellent progress here. It was Enterococcus and Staphylococcus that were noted she is allergic to penicillin so there is not much I can do from the Enterococcus standpoint though the staff does seem to be sensitive to the Bactrim. 03/06/2021 upon evaluation today patient appears to be doing well with regard to her wound. This is definitely showing signs of improvement which is great news and overall I am extremely pleased with where things stand today. There does  not appear to be any signs of active infection which is great news and overall and I do believe that we are headed in the appropriate direction I think the PuraPly is doing an awesome job for her. 03/20/2021 upon evaluation today patient actually appears to be doing well with regard to her wound. We have been using the PuraPly although last week when I was out of the office they actually just used endoform nonetheless this still seems to be doing great. Fortunately there does not appear to be any signs of active infection which is great news overall I think the patient is making wonderful progress. 03/27/2021 upon evaluation today patient appears to be doing well with regard to her wound in fact there is a little drainage but otherwise her does not appear to be any major signs of open wounds nor infection at this time. Overall I am extremely pleased with where things stand. No fevers, chills, nausea, vomiting, or diarrhea. 04/03/2021 upon evaluation today patient presents for reevaluation here in the clinic and overall she seems to be doing quite well. Fortunately there does not appear to be any signs of active infection which is great news and in general I am very pleased with how things appear. There is still a  very small opening remaining but in general she is looking much better. Fortunately I do not think there is any need for the PuraPly at this point. 04/10/2021 upon evaluation today patient's wound is actually showing signs of excellent improvement. I am actually very pleased with where it stands today. I think she is very close to complete closure in fact this may be healed today. Either way I think that she may benefit from using her own compression socks for the next week and then will subsequently see how things stand following. 04/17/2021 upon evaluation today patient appears to be doing well with regard to her wound on the ankle region. This is actually showing signs of  excellent improvement which is great news. I do not see any signs of active infection at this time which is great and no open wound. With that being said on the lateral portion of her ankle right at the base of her plate she actually has an area of pressure that occurred over the past week when she was not in the wrap or we been padding her. This does seem to be an issue which I think is good to be an ongoing problem I am can actually get in touch with Dr. [name] today to see what we can do in that regard. 04/24/2021 upon evaluation today patient appears to be doing well with regard to her ankle region for the most part although there does appear to be some fluid trapped underneath the area where we thought this was healed last week. Unfortunately I think at this time that we can have to reopen and clean away some of this area and then subsequently depending on how things do over the next week or so we will see about discharge but right now I do not think that is the right way to go. She voiced understanding. 05/01/2021 upon evaluation today patient's wound still appears to be open unfortunately. I do believe that we do need to see what we can do about trying to get this closed I believe compression wrapping is probably can to be indicated here. Fortunately there is no signs of active infection at this time. 05/08/2021 upon evaluation today patient appears to be doing okay in regard to her ulcer this is very superficial and we will see any major issues here. The biggest problem currently that I find is that we just cannot get this area to completely seal up. She seemed to almost be and then things digressed a little bit. Right now is not too bad but we do need to see if we can make some difference here. I think maybe switching over to collagen may be better. 05/15/2021 upon evaluation today patient actually appears to be doing pretty well in regard to her wound. This is measuring a little smaller and  looking much better. She did see orthopedics today there to me making her a insert for her shoe to try to help straighten out her gait. I think this can be beneficial for her as well. 05/22/2021 upon evaluation today patient appears to be doing well currently in regard to her wound although is not significantly smaller unfortunately this at least does not show any signs of active infection at this time which is great news. No fevers, chills, nausea, vomiting, or diarrhea. I am going to see about switching over to endoform that is what we wanted to use in the past always been on backorder. 06/05/2021 upon evaluation today patient appears to be doing  somewhat poorly in regard to her wound. This is really not showing the signs of improvement that I would have like to have seen. Fortunately I do not see any signs of active infection locally nor systemically which is great news. With that being said even though I do not see the sign she is having some increased discomfort which is not good. She is also having some issues here with the wound looking a little larger today compared to where its been. With that being said overall I believe that we probably need to consider at least that there may be a subacute infection here and that is what is going on. We have tried multiple different medications and dressings which have done well for her in the past without seeing any signs of improvement. She is in agreement with addressing this as such. 06/12/2020 upon evaluation today patient's wound is actually showing some signs of improvement today. In fact I do not even see any need for sharp debridement based on what I see at this point today. Fortunately there is no evidence of active infection currently which is great news. No fevers, chills, nausea, vomiting, or diarrhea. 06/26/2021 upon evaluation today patient appears to be doing well with regard to her wounds. Fortunately there does not appear to be any signs of  active infection at this time which is great news. I do believe the Bactrim is doing well for her. Fortunately there does not appear to be any signs of active infection at this time. With the hardware in place and everything else I do believe extending this for 2 additional weeks for 4 weeks total would be advisable. 07/03/2021 upon evaluation today patient appears to be doing well with regard to her wound. She has been tolerating the dressing changes which is great news. Fortunately there does not appear to be any evidence of active infection locally nor systemically at this time. 07/10/2021 upon evaluation today patient appears to be doing well with regard to her wound I do feel that the PuraPly has done an awesome job for her over the past week. Fortunately I do not see any signs of active infection at this time. No fevers, chills, nausea, vomiting, or diarrhea. 2/15; the patient's wound is an inverted cone. Most of the walls of the colon are epithelialized however the tip of the conus is still open. I gather this was considered to be quite an improvement.We reapplied Puraply in the standard fashion Electronic Signature(s) Signed: 07/17/2021 4:36:32 PM By: Linton Ham MD Entered By: Linton Ham on 07/17/2021 10:37:43 -------------------------------------------------------------------------------- Physical Exam Details Patient Name: Date of Service: [name], [name] 07/17/2021 9:30 A M Medical Record Number: [id number] Patient Account Number: [id number] Date of Birth/Sex: Treating RN: Feb 04, 1956 (66 y.o. [name] Provider: [name] Other Clinician: Referring Provider: Treating Provider/Extender: [name] in Treatment: 69 Constitutional Sitting or standing Blood Pressure is within target range for patient.. Pulse regular and within target range for patient.Marland Kitchen Respirations regular, non-labored and within target range.. Temperature  is normal and within the target range for the patient.Marland Kitchen Appears in no distress. Notes Wound exam; most of the wound area is nicely epithelialized under illumination. Although there is still some depth and at the deepest part of this there is a very tiny nonepithelialized area remaining. No evidence of infection. No uncontrolled edema Electronic Signature(s) Signed: 07/17/2021 4:36:32 PM By: Linton Ham MD Entered By: Linton Ham on 07/17/2021 10:38:34 -------------------------------------------------------------------------------- Physician Orders  Details Patient Name: Date of Service: [facility], [name] 07/17/2021 9:30 A M Medical Record Number: [id number] Patient Account Number: [id number] Date of Birth/Sex: Treating RN: August 16, 1955 (66 y.o. 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 Margarita Grizzle Wednesday Room 9 Return A Cellular or Tissue Based Products Cellular or Tissue Based Product Type: - Puraply AM #2 07/10/2021 Puraply AM for #3 07/17/2021 Puraply AM for #4 07/24/2021 Bathing/ Shower/ Hygiene May shower with protection but do not get wound dressing(s) wet. Edema Control - Lymphedema / SCD / Other Bilateral Lower Extremities Elevate legs to the level of the heart or above for 30 minutes daily and/or when sitting, a frequency of: - throughout the day when sitting Avoid standing for long periods of time. Patient to wear own compression stockings every  day. Exercise regularly Moisturize legs daily. Non Wound Condition pply the following to affected area as directed: - Apply silicone bordered foam to left lateral foot for protection A Wound Treatment Wound #15R - Malleolus Wound Laterality: Left, Medial Cleanser: Wound Cleanser (Generic) 1 x Per Day/15 Days Discharge Instructions: Cleanse the wound with wound cleanser prior to applying a clean dressing using gauze sponges, not tissue or cotton balls. Peri-Wound Care: Sween Lotion (Moisturizing lotion) 1 x Per Day/15 Days Discharge Instructions: Apply moisturizing lotion as directed Prim Dressing: Puraply AM 1 x Per YQM/57 Days ary Discharge Instructions: Provider applied to wound bed. Secondary Dressing: Woven Gauze Sponge, Non-Sterile 4x4 in (Generic) 1 x Per Day/15 Days Discharge Instructions: Apply over primary dressing as directed. Secondary Dressing: ABD Pad, 8x10 1 x Per Day/15 Days Discharge Instructions: Apply over primary dressing as directed. Secondary Dressing: ADAPTIC TOUCH 3x4.25 in 1 x Per Day/15 Days Discharge Instructions: Apply over primary dressing. Compression Wrap: CoFlex TLC XL 2-layer Compression System 4x7 (in/yd) 1 x Per Day/15 Days Discharge Instructions: Apply CoFlex 2-layer compression as directed. (alt for 4 layer) Electronic Signature(s) Signed: 07/17/2021 4:36:32 PM By: Linton Ham MD Signed: 07/17/2021 5:30:32 PM By: Deon Pilling RN, BSN Entered By: Deon Pilling on 07/17/2021 10:20:49 -------------------------------------------------------------------------------- Problem List Details Patient Name: Date of Service: [name], [name] 07/17/2021 9:30 A M Medical Record Number: [id number] Patient Account Number: [id number] Date of Birth/Sex: Treating RN: February 28, 1956 (66 y.o. [name] Primary Care Provider: [name] Other Clinician: Referring Provider: Treating Provider/Extender: [name] in Treatment:  54 Active Problems ICD-10 Encounter Code Description Active Date MDM Diagnosis I87.332 Chronic venous hypertension (idiopathic) with ulcer and inflammation of left 01/17/2020 No Yes lower extremity L97.828 Non-pressure chronic ulcer of other part of left lower leg with other specified 01/17/2020 No Yes severity L97.328 Non-pressure chronic ulcer of left ankle with other specified severity 01/17/2020 No Yes Inactive Problems ICD-10 Code Description Active Date Inactive Date L03.116 Cellulitis of left lower limb 01/31/2020 01/31/2020 T81.31XD Disruption of external operation (surgical) wound, not elsewhere classified, subsequent 01/17/2020 01/17/2020 encounter Resolved Problems Electronic Signature(s) Signed: 07/17/2021 4:36:32 PM By: Linton Ham MD Entered By: Linton Ham on 07/17/2021 10:36:06 -------------------------------------------------------------------------------- Progress Note Details Patient Name: Date of Service: [name]  [name], [name] 07/17/2021 9:30 A M Medical Record Number: [id number] Patient Account Number: [id number] Date of Birth/Sex: Treating RN: February 09, 1956 (66 y.o. [name] Provider: [name] Other Clinician: Referring Provider: Treating Provider/Extender: [name] in Treatment: 85 Subjective History of Present Illness (HPI) the remaining wound is over the left medial ankle. Similar wound over the right medial ankle healed largely with use of Apligraf. Most recently we have been using Hydrofera Blue over this wound with considerable improvement. The patient has been extensively worked up in the past for her venous insufficiency and she is not a candidate for antireflux surgery although I have none of the details available currently. 08/24/14; considerable improvement today. About 50% of this wound areas now epithelialized. The base of the wound appears to be healthier granulation.as opposed to last week  when she had deteriorated a considerable improvement 08/17/14; unfortunately the wound has regressed somewhat. The areas of epithelialization from the superior aspect are not nearly as healthy as they were last week. The patient thinks her Hydrofera Blue slipped. 09/07/14; unfortunately the area has markedly regressed in the 2 weeks since I've seen this. There is an odor surrounding erythema. The healthy granulation tissue that we had at the base of the wound now is a dusky color. The nurse reports green drainage 09/14/14; the area looks somewhat better than last week. There is less erythema and less drainage. The culture I did did not show any growth. Nevertheless I think it is better to continue the Cipro and doxycycline for a further week. The remaining wound area was debridement. 09/21/14. Wound did not require debridement last week. Still less erythema and less drainage. She can complete her antibiotics. The areas of epithelialization in the superior aspect of the wound do not look as healthy as they did some weeks ago 10/05/14 continued improvement in the condition of this wound. There is advancing epithelialization. Less aggressive debridement required 10/19/14 continued improvement in the condition and volume of this wound. Less aggressive debridement to the inferior part of this to remove surface slough and fibrinous eschar 11/02/14 no debridement is required. The surface granulation appears healthy although some of her islands of epithelialization seem to have regressed. No evidence of infection 11/16/14; lites surface debridement done of surface eschar. The wound does not look to be unhealthy. No evidence of infection. Unfortunately the patient has had podiatry issues in the right foot and for some reason has redeveloped small surface ulcerations in the medial right ankle. Her original presentation involved wounds in this area 11/23/14 no debridement. The area on the right ankle has enlarged. The  left ankle wound appears stable in terms of the surface although there is periwound inflammation. There has been regression in the amount of new skin 11/30/14 no debridement. Both wound areas appear healthy. There was no evidence of infection. The the new area on the right medial ankle has enlarged although that both the surfaces appear to be stable. 12/07/14; Debridement of the right medial ankle wound. No no debridement was done on the left. 12/14/14 no major change in and now bilateral medial ankle wounds. Both of these are very painful but the no overt evidence of infection. She has had previous venous ablation 12/21/14; patient states that her right medial ankle wound is considerably more painful last week than usual. Her left is also somewhat painful. She could not tolerate debridement. The right medial ankle wound has fibrinous surface eschar 12/28/14 this is a  patient with severe bilateral venous insufficiency ulcers. For a considerable period of time we actually had the one on the right medial ankle healed however this recently opened up again in June. The left medial ankle wound has been a refractory area with some absent flows. We had some success with Hydrofera Blue on this area and it literally closed by 50% however it is recently opened up Foley. Both of these were debridement today of surface eschar. She tolerates this poorly 01/25/15: No change in the status of this. Thick adherent escar. Very poor tolerance of any attempt at debridement. I had healed the right medial malleolus wound for a considerable amount of time and had the left one down to about 50% of the volume although this is totally regressed over the last 48 weeks. Further the right leg has reopened. she is trying to make a appointment with pain and vascular, previous ablations with Dr. Aleda Grana. I do not believe there is an arterial insufficiency issue here 02/01/15 the status of the adherent eschar bilaterally is actually  improved. No debridement was done. She did not manage to get vascular studies done 02/08/15 continued debridement of the area was done today. The slough is less adherent and comes off with less pressure. There is no surrounding infection peripheral pulses are intact 02/15/15 selective debridement with a disposable curette. Again the slough is less adherent and comes off with less difficulty. No surrounding infection peripheral pulses are intact. 02/22/15 selective debridement of the right medial ankle wound. Slough comes off with less difficulty. No obvious surrounding infection peripheral pulses are intact I did not debridement the one on the left. Both of these are stable to improved 03/01/15 selective debridement of both wound areas using a curette to. Adherent slough cup soft with less difficulty. No obvious surrounding infection. The patient tells me that 2 days ago she noted a rash above the right leg wrap. She did not have this on her lower legs when she change this over she arrives with widespread left greater than right almost folliculitis-looking rash which is extremely pruritic. I don't see anything to culture here. There is no rash on the rest of her body. She feels well systemically. 03/08/15; selective debridement of both wounds using a curette. Base of this does not look unhealthy. She had limegreen drainage coming out of the left leg wound and describes a lot of drainage. The rash on her left leg looks improved to. No cultures were done. 03/22/15; patient was not here last week. Basal wounds does not look healthy and there is no surrounding erythema. No drainage. There is still a rash on the left leg that almost looks vasculitic however it is clearly limited to the top of where the wrap would be. 04/05/15; on the right required a surgical debridement of surface eschar and necrotic subcutaneous tissue. I did not debridement the area on the left. These continue to be large open wounds that are  not changing that much. We were successful at one point in healing the area on the right, and at the same time the area on the left was roughly half the size of current measurements. I think a lot of the deterioration has to do with the prolonged time the patient is on her feet at work 04/19/15 I attempted-like surface debridement bilaterally she does not tolerate this. She tells me that she was in allergic care yesterday with extreme pain over her left lateral malleolus/ankle and was told that she has an "sprain"  05/03/15; large bilateral venous insufficiency wounds over the medial malleolus/medial aspect of her ankles. She complains of copious amounts of drainage and his usual large amounts of pain. There is some increasing erythema around the wound on the right extending into the medial aspect of her foot to. historically she came in with these wounds the right one healed and the left one came down to roughly half its current size however the right one is reopened and the left is expanded. 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 05/10/15 large bilateral venous insufficiency wounds. There is less adherence surface left however the surface culture that I did last week grew pseudomonas therefore bilateral selective debridement score necessary. There is surrounding erythema. The patient describes severe bilateral drainage and a lot of pain in the left ankle. Apparently her podiatrist was were ready to do a cortisone shot 05/17/15; the patient complains of pain and again copious amounts of drainage. 05/24/15; we used Iodo flex last week. Patient notes considerable improvement in wound drainage. Only needed to change this once. 05/31/15; we continued Iodoflex; the base of these large wounds bilaterally is not too bad but there is probably likely a significant bioburden here. I would like to debridement just doesn't tolerate it. 06/06/14 I would like to  continue the Iodoflex although she still hasn't managed to obtain supplies. She has bilateral medial malleoli or large wounds which are mostly superficial. Both of them are covered circumferentially with some nonviable fibrinous slough although she tolerates debridement very poorly. She apparently has an appointment for an ablation on the right leg by interventional radiology. 06/14/15; the patient arrives with the wounds and static condition. We attempted a debridement although she does not do well with this secondary to pain. I 07/05/15; wounds are not much smaller however there appears to be a cleaner granulating base. The left has tight fibrinous slough greater than the right. Debridement is tolerated poorly due to pain. Iodoflex is done more for these wounds in any of the multitude of different dressings I have tried on the left 1 and then subsequently the right. 07/12/15; no change in the condition of this wound. I am able to do an aggressive debridement on the right but not the left. She simply cannot tolerate it. We have been using Iodoflex which helps somewhat. It is worthwhile remembering that at one point we healed the right medial ankle wound and the left was about 25% of the current circumference. We have suggested returning to vascular surgery for review of possible further ablations for one reason or another she has not been able to do this. 07/26/15 no major change in the condition of either wound on her medial ankle. I did not attempt to debridement of these. She has been aggressively scrubbing these while she is in the shower at home. She has her supply of Iodoflex which seems to have done more for these wounds then anything I have put on recently. 08/09/15 wound area appears larger although not verified by measurements. Using Iodoflex 09/05/2015 -- she was here for avisit today but had significant problems with the wound and I was asked to see her for a physician opinion. I have summarize  that this lady has had surgery on her left lower extremity about 10 years ago where the possible veins stripping was done. She has had an opinion from interventional radiology around November 2016 where no further sclerotherapy was ordered.  The patient works 12 hours a day and stands on a concrete floor with work boots and is unable to get the proper compression she requires and cannot elevate her limbs appropriately at any given time. She has recently grown Pseudomonas from her wound culture but has not started her ciprofloxacin which was called in for her. 09/13/15 this continues to be a difficult situation for this patient. At one point I had this wound down to a 1.5 x 1.5" wound on her left leg. This is deteriorated and the right leg has reopened. She now has substantial wounds on her medial calcaneus, malleoli and into her lower leg. One on the left has surface eschar but these are far too painful for me to debridement here. She has a vascular surgery appointment next week to see if anything can be done to help here. I think she has had previous ablations several years ago at [facility]. She has no major edema. She tells me that she did not get product last time [facility] Ag] and went for several days without it. She continues to work in work boots 12 hours a day. She cannot get compression/4-layer under her work boots. 09/20/15 no major change. Periwound edema control was not very good. Her point with pain and vascular is next Wednesday the 25th 09/28/15; the patient is seen vascular surgery and is apparently scheduled for repeat duplex ultrasounds of her bilateral lower legs next week. 10/05/15; the patient was seen by Dr. Doren Custard of vascular surgery. He feels that she should have arterial insufficiency excluded as cause/contributed to her nonhealing stage she is therefore booked for an arteriogram. She has apparently monophasic signals in the dorsalis pedis pulses. She also of course has known  severe chronic venous insufficiency with previous procedures as noted previously. I had another long discussion with the patient today about her continuing to work 12 hour shifts. I've written her out for 2 months area had concerns about this as her work location is currently undergoing significant turmoil and this may lead to her termination. She is aware of this however I agree with her that she simply cannot continue to stand for 12 hours multiple days a week with the substantial wound areas she has. 10/19/15; the Dr. Doren Custard appointment was largely for an arteriogram which was normal. She does not have an arterial issue. He didn't make a comment about her chronic venous insufficiency for which she has had previous ablations. Presumably it was not felt that anything additional could be done. The patient is now out of work as I prescribed 2 weeks ago. Her wounds look somewhat less aggravated presumably because of this. I felt I would give debridement another try today 10/25/15; no major change in this patient's wounds. We are struggling to get her product that she can afford into her own home through her insurance. 11/01/15; no major change in the patient's wounds. I have been using silver alginate as the most affordable product. I spoke to Dr. Marla Roe last week with her requested take her to the OR for surgical debridement and placement of ACEL. Dr. Marla Roe told me that she would be willing to do this however [facility] will not cover this, fortunately the patient has [place] healthcare of some variant 11/08/15; no major change in the patient's wounds. She has been completely nonviable surface that this but is in too much pain with any attempted debridement are clinic. I have arranged for her to see Dr. Marla Roe ham of plastic  surgery and this appointment is on Monday. I am hopeful that they will take her to the OR for debridement, possible ACEL ultimately possible skin graft 11/22/15 no  major change in the patient's wounds over her bilateral medial calcaneus medial malleolus into the lower legs. Surface on these does not look too bad however on the left there is surrounding erythema and tenderness. This may be cellulitis or could him sleepy tinea. 11/29/15; no major changes in the patient's wounds over her bilateral medial malleolus. There is no infection here and I don't think any additional antibiotics are necessary. There is now plan to move forward. She sees Dr. Marla Roe in a week's time for preparation for operative debridement and ACEL placement I believe on 7/12. She then has a follow-up appointment with Dr. Marla Roe on 7/21 12/28/15; the patient returns today having been taken to the [place] by Dr. Marla Roe 12/12/15 she underwent debridement, intraoperative cultures [which were negative]. She had placement of a wound VAC. Parent really ACEL was not available to be placed. The wound VAC foam apparently adhered to the wound since then she's been using silver alginate, Xeroform under Ace wraps. She still says there is a lot of drainage and a lot of pain 01/31/16; this is a patient I see monthly. I had referred her to Dr. Marla Roe him of plastic surgery for large wounds on her bilateral medial ankles. She has been to the OR twice once in early July and once in early August. She tells me over the last 3 weeks she has been using the wound VAC with ACEL underneath it. On the right we've simply been using silver alginate. Under Kerlix Coban wraps. 02/28/16; this is a patient I'm currently seeing monthly. She is gone on to have a skin graft over her large venous insufficiency ulcer on the left medial ankle. This was done by Dr. Marla Roe him. The patient is a bit perturbed about why she didn't have one on her right medial ankle wound. She has been using silver alginate to this. 03/06/16; I received a phone call from her plastic surgery Dr. Marla Roe. She expressed some concern about the  viability of the skin graft she did on the left medial ankle wound. Asked me to place Endoform on this. She told me she is not planning to do a subsequent skin graft on the right as the left one did not take very well. I had placed Hydrofera Blue on the right 03/13/16; continue to have a reasonably healthy wound on the right medial ankle. Down to 3 mm in terms of size. There is epithelialization here. The area on the left medial ankle is her skin graft site. I suppose the last week this looks somewhat better. She has an open area inferiorly however in the center there appears to be some viable tissue. There is a lot of surface callus and eschar that will eventually need to come off however none of this looked to be infected. Patient states that the is able to keep the dressing on for several days which is an improvement. 03/20/16 no major change in the circumference of either wound however on the left side the patient was at Dr. Eusebio Friendly office and they did a debridement of left wound. 50% of the wound seems to be epithelialized. I been using Endoform on the left Hydrofera Blue in the right 03/27/16; she arrives today with her wound is not looking as healthy as they did last week. The area on the right clearly has an  adherent surface to this a very similar surface on the left. Unfortunately for this patient this is all too familiar problem. Clearly the Endoform is not working and will need to change that today that has some potential to help this surface. She does not tolerate debridement in this clinic very well. She is changing the dressing wants 04/03/16; patient arrives with the wounds looking somewhat better especially on the right. Dr. Migdalia Dk change the dressing to silver alginate when she saw her on Monday and also sold her some compression socks. The usefulness of the latter is really not clear and woman with severely draining wounds. 04/10/16; the patient is doing a bit of an experiment  wearing the compression stockings that Dr. Migdalia Dk provided her to her left leg and the out of legs based dressings that we provided to the right. 05/01/16; the patient is continuing to wear compression stockings Dr. Migdalia Dk provided her on the left that are apparently silver impregnated. She has been using Iodoflex to the right leg wound. Still a moderate amount of drainage, when she leaves here the wraps only last for 4 days. She has to change the stocking on the left leg every night 05/15/16; she is now using compression stockings bilaterally provided by Dr. Marla Roe. She is wearing a nonadherent layer over the wounds so really I don't think there is anything specific being done to this now. She has some reduction on the left wound. The right is stable. I think all healing here is being done without a specific dressing 06/09/16; patient arrives here today with not much change in the wound certainly in diameter to large circular wounds over the medial aspect of her ankle bilaterally. Under the light of these services are certainly not viable for healing. There is no evidence of surrounding infection. She is wearing compression stockings with some sort of silver impregnation as prescribed by Dr. Marla Roe. She has a follow-up with her tomorrow. 06/30/16; no major change in the size or condition of her wounds. These are still probably covered with a nonviable surface. She is using only her purchase stockings. She did see Dr. Marla Roe who seemed to want to apply Dakin's solution to this I'm not extreme short what value this would be. I would suggest Iodoflex which she still has at home. 07/28/16; I follow Mrs. Waight episodically along with Dr. Marla Roe. 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 She has been applying a topical collagen ointment to both wounds with Adaptic. I don't think Dr. Marla Roe is planning to take her back to  the OR. 08/19/16; I follow Mrs. Jeneen Rinks on a monthly basis along with Dr. Marla Roe of plastic surgery. 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 I been following her for a number of years. At one point I was able to get the right medial malleolus wound to heal and had the left medial malleolus down to about half its current size however and I had to send her to plastic surgery for an operative debridement. Since then things have been stable to slightly improve the area on the right is slightly better one in the left about the same although there is much less adherent surface than I'm used to with this patient. She is using some form of liquid collagen gel that Dr. Marla Roe provided a Kerlix cover with the patient's own pressure stockings. She tells me that she has extreme pain  in both ankles and along the lateral aspect of both feet. She has been unable to work for some period of time. She is telling me she is retiring at the beginning of April. She sees Dr. Doran Durand of orthopedics next week 09/22/16; patient has not seen Dr. Marla Roe since the last time she is here. I'm not really sure what she is using to the wounds other than bits and pieces of think she had left over including most recently Hydrofera Blue. She is using juxtalite stockings. She is having difficulty with her husband's recent illness "stroke". She is having to transport him to various doctors appointments. Dr. Marla Roe left her the option of a repeat debridement with ACEL however she has not been able to get the time to follow-up on this. She continues to have a fair amount of drainage out of these wounds with certainly precludes leaving dressings on all week 10/13/16; patient has not seen Dr. Marla Roe since she was last in our clinic. I'm not really sure what she is doing with the wounds, we did try to get her [facility] and I think she is actually using  this most of the time. Because of drainage she states she has to change this every second day although this is an improvement from what she used to do. She went to see Dr. Doran Durand who did not think she had a muscular issue with regards to her feet, he referred her to a neurologist and I think the appointment is sometime in June. I changed her back to Iodoflex which she has used in the past but not recently. 11/03/16; the patient has been using Iodoflex although she ran out of this. Still claims that there is a lot of drainage although the wound does not look like this. No surrounding erythema. She has not been back to see Dr. Marla Roe 11/24/16; the patient has been using Iodoflex again but she ran out of it 2 or 3 days ago. There is no major change in the condition of either one of these wounds in fact they are larger and covered in a thick adherent surface slough/nonviable tissue especially on the left. She does not tolerate mechanical debridement in our clinic. Going back to see Dr. Marla Roe of plastic surgery for an operative debridement would seem reasonable. 12/15/16; the patient has not been back to see Dr. Marla Roe. She is been dealing with a series of illnesses and her husband which of monopolized her time. She is been using Sorbact which we largely supplied. She states the drainage is bad enough that it maximum she can go 2-3 days without changing the dressing 01/12/2017 -- the patient has not been back for about 4 weeks and has not seen Dr. Marla Roe not does she have any appointment pending. 01/23/17; patient has not seen Dr. Marla Roe even though I suggested this previously. She is using Santyl that was suggested last week by Dr. Con Memos this Cost her $16 through her insurance which is indeed surprising 02/12/17; continuing Santyl and the patient is changing this daily. A lot of drainage. She has not been back to see plastic surgery she is using an Ace wrap. Our intake nurse suggested wrap  around stockings which would make a good reasonable alternative 02/26/17; patient is been using Santyl and changing this daily due to drainage. She has not been to see plastic surgery she uses in April Ace wrap to control the edema. She did obtain extremitease stockings but stated that the edema in her leg was  to big for these 03/20/17; patient is using Santyl and Anasept. Surfaces looked better today the area on the right is actually measuring a little smaller. She has states she has a lot of pain in her feet and ankles and is asking for a consult to pain control which I'll try to help her with through our case manager. 04/10/17; the patient arrives with better-looking wound surfaces and is slightly smaller wound on the left she is using a combination of Santyl and Anasept. She has an appointment or at least as started in the pain control center associated with [facility] 05/14/17; this is a patient who I followed for a prolonged period of time. She has venous insufficiency ulcers on her bilateral medial ankles. At one point I had this down to a much smaller wound on the left however these reopened and we've never been able to get these to heal. She has been using Santyl and Anasept gel although 2 weeks ago she ran out of the Anasept gel. She has a stable appearance of the wound. She is going to the wound care clinic at [facility]. They wanted do a nerve block/spinal block although she tells me she is reluctant to go forward with that. 05/21/17; this is a patient I have followed for many years. She has venous insufficiency ulcers on her bilateral medial ankles. Chronic pain and deformity in her ankles as well. She is been to see plastic surgery as well as orthopedics. Using PolyMem AG most recently/Kerramax/ABDs and 2 layer compression. She has managed to keep this on and she is coming in for a nurse check to change the dressing on Tuesdays, we see her on Fridays 06/05/17; really quite a  good looking surface and the area especially on the right medial has contracted in terms of dimensions. Well granulated healthy-looking tissue on both sides. Even with an open curet there is nothing that even feels abnormal here. This is as good as I've seen this in quite some time. We have been using PolyMem AG and bringing her in for a nurse check 06/12/17; really quite good surface on both of these wounds. The right medial has contracted a bit left is not. We've been using PolyMem and AG and she is coming in for a nurse visit 06/19/17; we have been using PolyMem AG and bringing her in for a nurse check. Dimensions of her wounds are not better but the surfaces looked better bilaterally. She complained of bleeding last night and the left wound and increasing pain bilaterally. She states her wound pain is more neuropathic than just the wounds. There was some suggestion that this was radicular from her pain management doctor in talking to her it is really difficult to sort this out. 06/26/17; using PolyMem and AG and bringing her in for a nurse check as All of this and reasonably stable condition. Certainly not improved. The dimensions on the lateral part of the right leg look better but not really measuring better. The medial aspect on the left is about the same. 07/03/16; we have been using PolyMen AG and bringing her in for a nurse check to change the dressings as the wounds have drainage which precludes once weekly changing. We are using all secondary absorptive dressings.our intake nurse is brought up the idea of using a wound VAC/snap VAC on the wound to help with the drainage to see if this would result in some contraction. This is not a bad idea. The area on the right medial  is actually looking smaller. Both wounds have a reasonable-looking surface. There is no evidence of cellulitis. The edema is well controlled 07/10/17; the patient was denied for a snap VAC by her insurance. The major issue with  these wounds continues to be drainage. We are using wicked PolyMem AG and she is coming in for a nurse visit to change this. The wounds are stable to slightly improved. The surface looks vibrant and the area on the right certainly has shrunk in size but very slowly 07/17/17; the patient still has large wounds on her bilateral medial malleoli. Surface of both of these wounds looks better. The dimensions seem to come and go but no consistent improvement. There is no epithelialization. We do not have options for advanced treatment products due to insurance issues. They did not approve of the wound VAC to help control the drainage. More recently we've been using PolyMem and AG wicked to allow drainage through. We have been bringing her in for a nurse visit to change this. We do not have a lot of options for wound care products and the home again due to insurance issues 07/24/17; the patient's wound actually looks somewhat better today. No drainage measurements are smaller still healthy-looking surface. We used silver collagen under PolyMen started last week. We have been bringing her in for a dressing change 07/31/17; patient's wound surface continued to look better and I think there is visible change in the dimensions of the wound on the right. Rims of epithelialization. We have been using silver collagen under PolyMen and bringing her in for a dressing change. There appears to be less drainage although she is still in need of the dressing change 08/07/17. Patient's wound surface continues to look better on both sides and the area on the right is definitely smaller. We have been using silver collagen and PolyMen. She feels that the drainage has been it has been better. I asked her about her vascular status. She went to see Dr. Aleda Grana at [facility] and had some form of ablation. I don't have much detail on this. I haven't my notes from 2016 that she was not a candidate for any further ablation but I  don't have any more information on this. We had referred her to vein and vascular I don't think she ever went. He does not have a history of PAD although I don't have any information on this either. We don't even have ABIs in our record 08/14/17; we've been using silver collagen and PolyMen cover. And putting the patient and compression. She we are bringing her in as a nurse visit to change this because ofarge amount of drainage. We didn't the ABIs in clinic today since they had been done in many moons 1.2 bilaterally. She has been to see vein and vascular however this was at [facility] and she had ablation although I really don't have any information on this all seemed biking get a report. She is also been operatively debrided by plastic surgery and had a cell placed probably 8-12 months ago. This didn't have a major effect. We've been making some gains with current dressings 08/19/17-She is here in follow-up evaluation for bilateral medial malleoli ulcers. She continues to tolerate debridement very poorly. We will continue with recently changed topical treatment; if no significant improvement may consider switching to Iodosorb/Iodoflex. 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 She has been using silver collagen and PolyMem. I believe she has been used and  tried on Iodoflex before. 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 These have been very refractory over time. We have been using silver collagen and PolyMen. She is coming in once a week for a doctor's and once a week for nurse visits. We are actually making some progress 09/18/17; the patient's wounds are smaller especially on the right medial. She arrives today to upset to consider even washing these off  with Anasept which I think is been part of the reason this is been closing. We've been using collagen covered in PolyMen otherwise. It is noted that she has a small area of folliculitis on the right medial calf that. As we are wrapping her legs I'll give her a short course of doxycycline to make sure this doesn't amount to anything. She is a long list of complaints today including imbalance, shortness of breath on exertion, inversion of her left ankle. With regards to the latter complaints she is been to see orthopedics and they offered her a tendon release surgery I believe but wanted her wounds to be closed first. I have recommended she go see her primary physician with regards to everything else. 09/25/17; patient's wounds are about the same size. We have made some progress bilaterally although not in recent weeks. She will not allow me T wash these wounds with Anasept even if she is doing her cell. Wheeze we've been using collagen covered in PolyMen. Last week she had a small area of folliculitis this is now opened into a small wound. She completed 5 days of trimethoprim sulfamethoxazole 10/02/17; unfortunately the area on her left medial ankle is worse with a larger wound area towards the Achilles. The patient complains of a lot of pain. She will not allow debridement although visually I don't think there is anything to debridement in any case. We have been using silver collagen and PolyMen for several months now. Initially we are making some progress although I'm not really seeing that today. We will move back to [facility]. His admittedly this is a bit of a repeat however I'm hoping that his situation is different now. The patient tells me she had her leg on the left give out on her yesterday this is process some pain. 10/09/17; the patient is seen twice a week largely because of drainage issues coming out of the chronic medial bimalleolar wounds that are chronic. Last week the dimensions of  the one on the left looks a little larger I changed her to [facility]. She comes in today with a history of terrible pain in the bilateral wound areas. She will not allow debridement. She will not even allow a tissue culture. There is no surrounding erythema no no evidence of cellulitis. We have been putting her Kerlix Coban man. She will not allow more aggressive compression as there was a suggestion to put her in 3 layer wraps. 10/16/17; large wounds on her bilateral medial malleoli. These are chronic. Not much change from last week. The surface looks have healthy but absolutely no epithelialization. A lot of pain little less so of drainage. She will not allow debridement or even washing these off in the vigorous fashion with Anasept. 10/23/17; large wounds on her bilateral malleoli which are chronic. Some improvement in terms of size perhaps on the right since last time I  saw these. She states that after we increased the 3 layer compression there was some bleeding, when she came in for a nurse visit she did not want 3 layer compression put back on about our nurse managed to convince her. She has known chronic venous visit issues and I'm hoping to get her to tolerate the 3 layer compression. using Hydrofera Blue 10/30/17; absolutely no change in the condition of either wound although we've had some improvement in dimensions on the right.. Attempted to put her in 3 layer compression she didn't tolerated she is back in 2 layer compression. We've been using Hydrofera Blue We looked over her past records. She had venous reflux studies in November 2016. There was no evidence of deep venous reflux on the right. Superficial vein did not show the greater saphenous vein at think this is been previously ablated the small saphenous vein was within normal limits. The left deep venous system showed no DVT the vessels were positive for deep venous reflux in the posterior tibial veins at the ankle. The greater  saphenous vein was surgically absent small saphenous vein was within normal limits. She went to vein and vascular at [place] [facility]. I believe she had an ablation on the left greater saphenous vein. I'll update her reflux studies perhaps ever reviewed by vein and vascular. We've made absolutely no progress in these wounds. Will also try to read and TheraSkins through her insurance 11/06/17; W the patient apparently has a 2 week follow-up with vein and vascular I like him to review the whole issue with regards to her previous vascular workup by Dr. Aleda Grana. We've really made no progress on these wounds in many months. She arrives today with less viable looking surface on the left medial ankle wound. This was apparently looking about the same on Tuesday when she was here for nurse visit. 11/13/17; deep tissue culture I did last time of the left lower leg showed multiple organisms without any predominating. In particular no Staphylococcus or group A strep were isolated. We sent her for venous reflux studies. She's had a previous left greater saphenous vein stripping and I think sclerotherapy of the right greater saphenous vein. She didn't really look at the lesser saphenous vein this both wounds are on the medial aspect. She has reflux in the common femoral vein and popliteal vein and an accessory vein on the right and the common femoral vein and popliteal vein on the left. I'm going to have her go to see vein and vascular just the look over things and see if anything else beside aggressive compression is indicated here. We have not been able to make any progress on these wounds in spite of the fact that the surface of the wounds is never look too bad. 11/20/17; no major change in the condition of the wounds. Patient reports a large amount of drainage. She has a lot of complaints of pain although enlisting her today I wonder if some of this at least his neuropathic rather than secondary to her wounds.  She has an appointment with vein and vascular on 12/30/17. The refractory nature of these wounds in my mind at least need vein and vascular to look over the wounds the recent reflux studies we did and her history to see if anything further can be done here. I also note her gait is deteriorated quite a bit. 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 She has a bilateral Trendelenburg gait. I wonder if this is  neuropathic or perhaps multilevel radicular. 11/27/17; her wounds actually looks slightly better. Healthy-looking granulation tissue a scant amount of epithelialization. [place] [employer] will not pay for [employer]. They will play for tri layer Oasis and Dermagraft. This is not a diabetic ulcer. We'll try for the tri layer Oasis. She still complains of some drainage. She has a vein and vascular appointment on 12/30/17 12/04/17; the wounds visually look quite good. Healthy-looking granulation with some degree of epithelialization. We are still waiting for response to our request for trial to try layer Oasis. Her appointment with vascular to review venous and arterial issues isn't sold the end of July 7/31. Not allow debridement or even vigorous cleansing of the wound surface. 12/18/17; slightly smaller especially on the right. 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 We've been using Hydrofera Blue. We still are looking into triple layer Oasis through her insurance 01/08/18 on evaluation today patient's wound actually appears to be showing signs of good improvement at this point in time. She has been tolerating the dressing changes without complication. Fortunately there does not appear to be any evidence of infection at this point in time. We have been utilizing silver nitrate which does seem to be of benefit for her which is also good news. Overall I'm very happy with how things seem to be both regards appearance as well as measurement. Patient did see Dr. Bridgett Larsson  for evaluation on 12/30/17. In his assessment he felt that stripping would not likely add much more than chronic compression to the patient's healing process. His recommendation was to follow-up in three months with Dr. Doren Custard if she hasn't healed in order to consider referral back to you and see vascular where she previously was in a trial and was able to get her wound to heal. I'll be see what she feels she when you staying compression and he reiterated this as well. 01/13/18 on evaluation today patient appears to actually be doing very well in regard to her bilateral medial malleolus ulcers. She seems to have tolerated the chemical cauterization with silver nitrate last week she did have some pain through that evening but fortunately states that I'll be see since it seems to be doing better she is overall pleased with the progress. 01/21/18; really quite a remarkable improvement since I've last seen these wounds. We started using silver nitrate specially on the islands of hyper granulation which for some reason her around the wound circumference. This is really done quite nicely. Primary dressing Hydrofera Blue under 4 layer compression. She seems to be able to hold out without a nurse rewrap. Follow-up in 1 week 01/28/18; we've continued the hydrofera blue but continued with chemical cauterization to the wound area that we started about a month ago for irregular hyper granulation. She is made almost stunning improvement in the overall wound dimensions. I was not really expecting this degree of improvement in these chronic wounds 02/05/18; we continue with Hydrofera Bluebut of also continued the aggressive chemical cauterization with silver nitrate. We made nice progress with the right greater than left wound. 02/12/18. We continued with Hydrofera Blue after aggressive chemical cauterization with silver nitrate. We appear to be making nice progress with both wound areas 02/19/2018; we continue with  [facility] after washing the wounds vigorously with Anasept spray and chemical cauterization with silver nitrate. We are making excellent progress. The area on the right's just about closed 02/26/2018. The area on the left medial ankle had too much necrotic debris today. I used  a #5 curette we are able to get most of the soft. I continued with the silver nitrate to the much smaller wound on the right medial ankle she had a new area on her right lower pretibial area which she says was due to a role in her compression 03/05/2018; both wound areas look healthy. Not much change in dimensions from last week. I continue to use silver nitrate and Hydrofera Blue. The patient saw Dr. Doren Custard of vein and vascular. He felt she had venous stasis ulcers. He felt based on her previous arteriogram she should have adequate circulation for healing. Also she has deep venous reflux but really no significant correctable superficial venous reflux at this time. He felt we should continue with conservative management including leg elevation and compression 04/02/2018; since we last saw this woman about a month ago she had a fall apparently suffered a pelvic fracture. I did not look up the x-ray. Nevertheless because of pain she literally was bedbound for 2 weeks and had home health coming out to change the dressing. Somewhat predictably this is resulted in considerable improvement in both wound areas. The right is just about closed on the medial malleolus and the left is about half the size. 04/16/2018; both her wounds continue to go down in size. Using Hydrofera Blue. 05/07/18; both her wounds appeared to be improving especially on the right where it is almost closed. We are using Hydrofera Blue 05/14/2018; slightly worse this week with larger wounds. Surface on the left medial not quite as good. We have been using Hydrofera Blue 05/21/18; again the wounds are slightly larger. Left medial malleolus slightly larger with  eschar around the circumference. We have been using Hydrofera Blue undergoing a wraps for a prolonged period of time. This got a lot better when she was more recumbent due to a fall and a back injury. I change the primary dressing the silver alginate today. She did not tolerate a 4 layer compression previously although I may need to bring this up with her next time 05/28/2018; area on the left medial malleolus again is slightly larger with more drainage. Area on the right is roughly unchanged. She has a small area of folliculitis on the right medial just on the lower calf. This does not look ominous. 06/03/2018 left medial malleolus slightly smaller in a better looking surface. We used silver nitrate on this last time with silver alginate. The area on the right appears slightly smaller 1/10; left medial malleolus slightly smaller. Small open area on the right. We used silver nitrate and silver alginate as of 2 weeks ago. We continue with the wound and compression. These got a lot better when she was off her feet 1/17; right medial malleolus wound is smaller. The left may be slightly smaller. Both surfaces look somewhat better. 1/24; both wounds are slightly smaller. Using silver alginate under Unna boots 1/31; both wounds appear smaller in fact the area on the right medial is just about closed. Surface eschar. We have been using silver alginate under Unna boots. The patient is less active now spends let much less time on her feet and I think this is contributed to the general improvement in the wound condition 2/7; both wounds appear smaller. I was hopeful the right medial would be closed however there there is still the same small open area. Slight amount of surface eschar on the left the dimensions are smaller there is eschar but the wound edges appear to be free. We have been  using silver alginate under Unna boot's 2/14; both wounds once again measure smaller. Circumferential eschar on the left  medial. We have been using silver alginate under Unna boots with gradual improvement 2/21; the area on the right medial malleolus has healed. The area on the left is smaller. We have been using silver alginate and Unna boots. We can discharge wrapping the right leg she has 20/30 stockings at home she will need to protect the scar tissue in this area 2/28; the area on the right medial malleolus remains closed the patient has a compression stocking. The area on the left is smaller. We have been using silver alginate and Unna boots. 3/6 the area on the right medial ankle remains closed. Good edema control noted she is using her own compression stocking. The area on the left medial ankle is smaller. We have been managing this with silver alginate and Unna boots which we will continue today. 3/13; the area on the right medial ankle remains closed and I'm declaring it healed today. When necessary the left is about the same still a healthy-looking surface but no major change and wound area. No evidence of infection and using silver alginate under unna and generally making considerable improvement 3/27 the area on the right medial ankle remains closed the area on the left is about the same as last week. Certainly not any worse we have been using silver alginate under an Unna boot 4/3; the area on the right medial ankle remains closed per the patient. We did not look at this wound. The wound on the left medial ankle is about the same surface looks healthy we have been using silver alginate under an Unna boot 4/10; area on the right medial ankle remains closed per the patient. We did not look at this wound. The wound on the left medial ankle is slightly larger. The patient complains that the [facility] caused burning pain all week. She also told us that she was a lot more active this week. Changed her back to silver alginate 4/17; right medial ankle still closed per the patient. Left medial ankle is  slightly larger. Using silver alginate. She did not tolerate Hydrofera Blue on this area 4/24; right medial ankle remains closed we have not look at this. The left medial ankle continues to get larger today by about a centimeter. We have been using silver alginate under Unna boots. She complains about 4 layer compression as an alternative. She has been up on her feet working on her garden 5/8; right medial ankle remains closed we did not look at this. The left medial ankle has increased in size about 100%. We have been using silver alginate under Unna boots. She noted increased pain this week and was not surprised that the wound is deteriorated 5/15; no major change in SA however much less erythema ( one week of doxy ocellulitis). 5/22-66 year old female returns at 1 week to the clinic for left medial ankle wound for which we have been using silver alginate under 3 layer compression She was placed on DOXY at last visit - the wound is wider at this visit. She is in 3 layer compression 5/29; change to [facility] last week. I had given her empiric doxycycline 2 weeks ago for a week. She is in 3 layer compression. She complains of a lot of pain and drainage on presentation today. 6/5; using Hydrofera Blue. I gave her doxycycline recently empirically for erythema and pain around the wound. Believe her cultures showed  enterococcus which not would not have been well covered by doxycycline nevertheless the wound looks better and I don't feel specifically that the enterococcus needs to be covered. She has a new what looks like a wrap injury on her lateral left ankle. 6/12; she is using Hydrofera Blue. She has a new area on the left anterior lower tibial area. This was a wrap injury last week. 6/19; the patient is using Hydrofera Blue. She arrived with marked inflammation and erythema around the wound and tenderness. 12/01/18 on evaluation today patient appears to be doing a little bit better based on what  I'm hearing from the standpoint of lassos evaluation to this as far as the overall appearance of the wound is concerned. Then sometime substandard she typically sees Dr. Dellia Nims. Nonetheless overall very pleased with the progress that she's made up to this point. No fevers, chills, nausea, or vomiting noted at this time. 7/10; some improvement in the surface area. Aggressively debrided last week apparently. I went ahead with the debridement today although the patient does not tolerate this very well. We have been using Iodoflex. Still a fair amount of drainage 7/17; slightly smaller. Using Iodoflex. 7/24; no change from last week in terms of surface area. We have been using Iodoflex. Surface looks and continues to look somewhat better 7/31; surface area slightly smaller better looking surface. We have been using Iodoflex. This is under Unna boot compression 8/7-Patient presents at 1 week with Unna boot and Iodoflex, wound appears better 8/14-Patient presents at 1 week with Iodoflex, we use the Unna boot, wound appears to be stable better.Patient is getting Botox treatment for the inversion of the foot for tendon release, Next week 8/21; we are using Iodoflex. Unna boot. The wound is stable in terms of surface area. Under illumination there is some areas of the wound that appear to be either epithelialized or perhaps this is adherent slough at this point I was not really clear. It did not wipe off and I was reluctant to debride this today. 8/28; we are using Iodoflex in an Unna boot. Seems to be making good improvement. 9/4; using Iodoflex and wound is slightly smaller. 9/18; we are using Iodoflex with topical silver nitrate when she is here. The wound continues to be smaller 10/2; patient missed her appointment last week due to GI issues. She left and Iodoflex based dressing on for 2 weeks. Wound is about the same size about the size of a dime on the left medial lower 10/9 we have been using  Iodoflex on the medial left ankle wound. She has a new superficial probable wrap injury on the dorsal left ankle 10/16; we have been using Hydrofera Blue since last week. This is on the left medial ankle 10/23; we have been using Hydrofera Blue since 2 weeks ago. This is on the left medial ankle. Dimensions are better 11/6; using Hydrofera Blue. I think the wound is smaller but still not closed. Left medial ankle 11/13; we have been using Hydrofera Blue. Wound is certainly no smaller this week. Also the surface not as good. This is the remanent of a very large area on her left medial ankle. 11/20; using Sorbact since last week. Wound was about the same in terms of size although I was disappointed about the surface debris 12/11; 3-week follow-up. Patient was on vacation. Wound is measuring slightly larger we have been using Sorbact. 12/18; wound is about the same size however surface looks better last week after debridement. We have  been using Sorbact under compression 1/15 wound is probably twice the size of last time increased in length nonviable surface. We have been using Sorbact. She was running a mild fever and missed her appointment last week 1/22; the wound is come down in size but under illumination still a very adherent debris we have been Hydrofera Blue that I changed her to last week 1/29; dimensions down slightly. We have been using Hydrofera Blue 2/19 dimensions are the same however there is rims of epithelialization under illumination. Therefore more the surface area may be epithelialized 2/26; the patient's wound actually measures smaller. The wound looks healthy. We have been using Hydrofera Blue. I had some thoughts about running Apligraf then I still may do that however this looks so much better this week we will delay that for now 3/5; the wound is small but about the same as last week. We have been using Hydrofera Blue. No debridement is required today. 3/19; the wound is about  the size of a dime. Healthy looking wound even under illumination. We have been using Hydrofera Blue. No mechanical debridement is necessary 3/26; not much change from last week although still looks very healthy. We have been using Hydrofera Blue under Unna boots Patient was offered an ankle fusion by podiatry but not until the wound heals with a proceed with this. 4/9; the patient comes in today with her original wound on the medial ankle looking satisfactory however she has some uncontrolled swelling in the middle part of her leg with 2 new open areas superiorly just lateral to the tibia. I think this was probably a wrap issue. She said she felt uncomfortable during the week but did not call in. We have been using Hydrofera Blue 4/16; the wound on the medial ankle is about the same. She has innumerable small areas superior to this across her mid tibia. I think this is probably folliculitis. She is also been working in the yard doing a lot of sweating 4/30; the patient issue on the upper areas across her mid tibia of all healed. I think this was excessive yard work if I remember. Her wound on the medial ankle is smaller. Some debris on this we have been using Hydrofera Blue under Unna boots 5/7; mid tibia. She has been using Hydrofera Blue under an Unna wrap. She is apparently going for her ankle surgery on June 3 10/28/19-Patient returns to clinic with the ankle wound, we are using Hydrofera Blue under Unna wrap, surgery is scheduled for her left foot for June 3 so she will be back for nurse visit next week READMISSION 01/17/2020 Mrs. 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 When she was off  her legs for a prolonged period example when she fell and suffered a compression fracture things would heal up quite nicely. She is now retired and we managed to heal up the right medial leg wound. The left one was very tiny last time I saw this although still refractory. 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 It got to the point where this was interfering with her walking  and she elected to proceed with a ankle arthrodesis to straighten her her ankle and leave her with a functional outcome for mobilization. The patient was referred to Dr. Doren Custard and really this took some time to arrange. Dr. Doren Custard saw her on 12/07/2019. Once again he verified that she had no arterial issues. She had previously had an angiogram several years ago. Follow-up ABIs on the left showed an ABI of 1.12 with triphasic waveforms and a TBI of 0.92. She is felt to have chronic deep venous insufficiency but I do not think it was felt that anything could be done from about this from an ablation point of view. At the time Dr. Doren Custard saw this patient the wounds actually look closed via the pictures in his clinic. The patient finally underwent her surgery on 12/15/2019. This went reasonably well and there was a good anatomic outcome. She developed a small distal wound dehiscence on the lateral part of the surgical wound. However more problematically she is developed recurrence of the wound on the medial left ankle. There are actually 2 wounds here one in the distal lower leg and 1 pretty much at the level of the medial malleolus. It is a more distal area that is more problematic. She has been using Hydrofera Blue which started on Friday before this she was simply Ace wrapping. There was a culture done that showed Pseudomonas and she is on ciprofloxacin. A recent CNS on 8/11 was negative. The patient reports some pain but I generally think this is  improving. She is using a cam boot completely nonweightbearing using a walker for pivot transfers and a wheelchair 8/24; not much improvement unfortunately she has a surgical wound on the lateral part in the venous insufficiency wound medially. The bottom part of the medial insufficiency wound is still necrotic there is exposed tendon here. We have been using Hydrofera Blue under compression. Her edema control is however better 8/31; patient in for follow-up of his surgical wound on the lateral part of her left leg and chronic venous insufficiency ulcers medially. We put her back in compression last week. She comes in today with a complaint of 3 or 4 days worth of increasing pain. She felt her cam walker was rubbing on the area on the back of her heel. However there is intense erythema seems more likely she has cellulitis. She had 2 cultures done when she was seeing podiatry in the postop. One of them in late July showed Pseudomonas and she received a course of ciprofloxacin the other was negative on 8/11 she is allergic to penicillin with anaphylactoid complaints of hives oral swelling via information in epic 9/9; when I saw this patient last week she had intense anterior erythema around her wound on the right lateral heel and ankle and also into the right medial heel. Some of this was no doubt drainage and her walker boot however I was convinced she had cellulitis. I gave her Levaquin and Bactrim she is finishing up on this now. She is following up with Dr. [name] he saw her yesterday. He is taken her out of the walking boot of course she is still nonweightbearing. Her x-ray was negative for any worrisome features such as soft tissue air etc. Things are a lot better this week. She has home health. We have been using Hydrofera Blue under an [employer] which she put back on yesterday. I did not wrap her last week 9/17; her surrounding skin looks a lot better. In fact the  area on the left lateral ankle  has just a scant amount of eschar. The only remaining wound is the large area on the left medial ankle. Probably about 60% of this is healthy granulation at the surface however she has a significant divot distally. This has adherent debris in it. I been using debridement and silver collagen to try and get this area to fill-in although I do not think we have made much progress this week 9/24; the patient's wound on the left medial ankle looks a lot better. The deeper divot area distally still requires debridement but this is cleaning up quite nicely we have been using silver collagen. The patient is complaining of swelling in her foot and is worried that that is contributing to the nonhealing of the ankle wound. She is also complaining of numbness in her anterior toes 10/4; left medial ankle. The small area distally still has a divot with necrotic material that I have been debriding away. This has an undermining area. She is approved for Apligraf. She saw Dr. [name] her surgeon on 10/1. I think he declared himself is satisfied with the condition of things. Still nonweightbearing till the end of the month. 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 Her surgical wound is well closed. There is no evidence of infection 10/11; the patient arrived in clinic today with the expectation that we be able to put an Apligraf on this area after debridement however she arrives with a relatively large amount of green drainage on the dressing. The patient states that this started on Friday. She has not been systemically unwell. 10/19; culture I did last week showed both Enterococcus and Pseudomonas. I think this came in separate parts because I stopped her ciprofloxacin I gave her and prescribed her linezolid however now looking at the final culture result this is Pseudomonas which is resistant to quinolones. She has not yet picked up the linezolid apparently phone issues. We are also  trying to get a topical antibiotic out of [place] in [place] they can be applied by home health. She is still having green drainage 10/16; the patient has her topical antibiotic from [facility] in [place]. This is a compounded gel with vancomycin and ciprofloxacin and gentamicin. We are applying this on the wound bed with silver alginate over the top with Unna boot wraps. She arrives in clinic today with a lot less ominous looking drainage although she is only use this topical preparation once the second time today. She sees Dr. [name] her surgeon on Friday she has home health changing the dressing 11/2; still using her compounded topical antibiotic under silver alginate. Surface is cleaning up there is less drainage. We had an Apligraf for her today and I elected to apply it. A light coating of her antibiotic 04/25/2020 upon evaluation today patient appears to be doing well with regard to her ankle ulcer. There is a little bit of slough noted on the surface of the wound I am can have to perform sharp debridement to clear this away today. With that being said other than that fact overall I feel like she is making progress and we do see some new epithelial growth. There is also some improvement in the depth of the wound and that distal portion. There is little bit of slough there as well. 12/7; 2-week follow-up. Apligraf #3. Dimensions are smaller. Closing in especially inferiorly. Still some surface debris. Still using the [facility] topical antibiotic but I told her  that I don't think this needs to be renewed 12/21; 2-week follow-up. Apligraf #4 dimensions are smaller. Nice improvement 06/05/2020; 2-week follow-up. The patient's wound on the left medial ankle looks really excellent. Nice granulation. Advancing epithelialization no undermining no evidence of infection. We would have to reapply for another Apligraf but with the condition of this wound I did not feel strongly about it. We  used Hydrofera Blue under the same degree of compression. She follows up with Dr. [name] her surgeon a week Friday 06/13/2020 upon evaluation today patient appears to be doing excellent in regard to her wound. She has been tolerating the dressing changes without complication. Fortunately there is no signs of active infection at this time. No fevers, chills, nausea, vomiting, or diarrhea. She was using Hydrofera Blue last week. 06/20/2020 06/20/2020 on evaluation today patient actually appears to be doing excellent in regard to her wound. This is measuring better and looking much better as well. She has been using the collagen that seems to be doing better for her as well even though the [facility] was and is not sticking or feeling as rough on her wound. She did see Dr. [name] on Friday he is very pleased he also stated none of the hardware has shifted. That is great news 1/27; the patient has a small clean wound all that is remaining. I agree that this is too small to really consider an Apligraf. Under illumination the surface is looking quite good. We have been using collagen although the dimensions are not any better this week 2/2; the patient has a small clean wound on the left medial ankle. Although this left of her substantial original areas. Measurements are smaller. We have been using polymen Ag under an Haematologist. 2/10; small area on the left medial ankle. This looks clean nothing to debride however dimensions are about the same we have been using polymen I think now for 2 weeks 2/17; not much change in surface area. We have been using polymen Ag without any improvement. 3/17; 1 month follow-up. The patient has been using endoform without any improvement in fact I think this looks worse with more depth and more expansion 3/24; no improvement. Perhaps less debris on the surface. We have been using Sorbact for 1 week 4/4; wound measures larger. She has edema in her leg and her foot which she  tells as her wrap came down. We have been using Unna boots. Sorbact of the wound. She has been approved for Apligraf 09/12/2020 upon evaluation today patient appears to be doing well with regard to her wound. We did get the Apligraf reapproved which is great news we have that available for application today. Fortunately there is no signs of infection and overall the patient seems to be doing great. The wound bed is nice and clean. 4/27; patient presents for her second application of Apligraf. She states over the past week she has been on her feet more often due to being outside in her garden. She has noted more swelling to her foot as a result. She denies increased warmth, pain or erythema to the wound site. 10/10/2020 upon evaluation today patient appears to be doing well with regard to her wound which does not appear to be quite as irritated as last week from what I am hearing. With that being said unfortunately she is having issues with some erythema and warmth to touch as well as an increase size. I do believe this likely is infected. 10/17/2020 upon evaluation today  patient appears to be doing excellent in regard to her wound this is significantly improved as compared to last week. Fortunately I think that the infection is much better controlled at this point. She did have evidence of both Enterococcus as well as Staphylococcus noted on culture. Enterococcus really would not be helped significantly by the Cipro but the wound is doing so much better I am under the assumption that the Staphylococcus is probably the main organism that is causing the current infection. Nonetheless I think that she is doing excellent as far as that is concerned and I am very pleased in that regard. I would therefore recommend she continue with the Cipro. 10/31/2020 upon evaluation today patient appears to be doing well with regard to her wound. She has been tolerating the dressing changes without complication. Fortunately  there is no signs of active infection and overall I am extremely pleased with where things stand today. No fevers, chills, nausea, vomiting, or diarrhea. With that being said she does have some green drainage coming from the wound and although it looks okay I am a little concerned about the possibility of a continuing infection. Specifically with Pseudomonas. For that reason I will go ahead and send in a prescription for Cipro for her to be continued. 11/14/2020 upon evaluation today patient appears to be doing very well currently in regard to her wound on her leg. She has been tolerating the dressing changes without complication. Fortunately I feel like the infection is finally under good control here. Unfortunately we do not have the Apligraf for application today although we can definitely order to have it in place for next week. That will be her fifth and final of the current series. Nonetheless I feel like her wound is really doing quite well which is great news. 11/21/2020 upon evaluation today patient appears to be doing well with regard to her wound on the medial ankle. Fortunately I think the infection is under control and I do believe we can go ahead and reapply the Apligraf today. She is in agreement with that plan. There does not appear to be any signs of active infection at this time which is great news. No fevers, chills, nausea, vomiting, or diarrhea. 12/05/2020 upon evaluation today patient's wound bed actually showed signs of good granulation epithelization at this point. There does not appear to be any signs of infection which is great news and overall very pleased with where things stand. Overall the patient seems to be doing fairly well in my opinion with regard to her wound although I do believe she continues to build up a lot of biofilm I think she could benefit from using PuraPly at this point. 12/12/2020 upon evaluation today patient's wound actually appears to be doing decently well  today. The Unna boot has not been quite as well-tolerated so that more uncomfortable for her and even causing some pressure over the plate on the lateral portion of her foot which is 90 where the wound is. There did not appear to be any significant deep tissue injury with that there may be a minimal change in the skin noted I think that we may want to go back to the Coflex 2 layer which is a little bit easier on her skin it seems. 12/19/2020 upon evaluation today patient actually seems to be making great progress with the PuraPly currently. She in fact seems to be much better as far as the overall appearance of the wound bed is concerned I am very  happy in this regard. I do not see any signs of of infection which is great news as well. No fevers, chills, nausea, vomiting, or diarrhea. 12/26/2020 upon evaluation today patient appears to actually be doing better in regard to her wound on the left medial ankle region. The surface of the wound is actually doing significantly better which is great news. There does not appear to be any signs of infection which is also great news and in general I am extremely pleased with where we stand today. 01/02/2021 upon evaluation today patient appears to be doing well with regard to her wound. In fact this is showing signs of excellent improvement and very pleased with where things stand. In fact the last 3 appointments have all shown signs of this getting smaller which is excellent news. I have not even had to perform any debridement and today is no exception. Overall I feel like this is dramatically improved compared to previous. T oday is PuraPly application #4. 12/15/9676 upon evaluation today patient appears to be doing excellent in regard to her wound this is continue to show signs of improvement and overall I am extremely pleased with where we stand today. She is actually here for PuraPly application #5. Every time we have applied this she is noted  definite improvement on measurements. 01/23/2021 upon evaluation today patient is actually making good progress in regard to her wound. This was actually on just a little bit longer this time compared to previous due to the fact that she did have to go out of town. She is actually here for PuraPly application #6. We have definitely been seeing improvements in the overall quality of the tissue on the surface of the wound which is awesome news. In general I think that the patient seems to be continuing to make great progress here. 01/30/2021 upon evaluation today patient's wound is actually doing excellent. There is really not any significant biofilm buildup which is great news and overall I am extremely pleased with where things stand today. There does not appear to be any signs of active infection. No fevers, chills, nausea, vomiting, or diarrhea. 02/06/2021 upon evaluation today patient's wound is actually showing signs of excellent improvement which is great news. We continue to see the benefit of the PuraPly this is doing a great job the wound seems not really irritated whatsoever and is showing signs of good granulation at this point. Overall I am extremely happy with what we are seeing. The patient likewise is happy to hear all of this as well. 02/13/2021 upon evaluation today patient appears to be doing well with regard to her wound. This again is measuring smaller today and I am very pleased in this regard. Fortunately there does not appear to be any signs of active infection at this time which is good news from a systemic standpoint. Locally there is still some significant drainage which she does have is concerned about infection locally. No fevers, chills, nausea, vomiting, or diarrhea. 02/20/2021 upon evaluation today patient actually appears to be doing decently well in regard to her wound. She has been tolerating the dressing changes without complication. I do believe the PuraPly is helping  wound bed does appear to be doing better. There is no evidence of active infection locally or systemically at this point visually although on fluorescence imaging there still appear to be bacterial load and bioburden noted. 02/27/2021 upon evaluation today patient fortunately continues to show signs of improvement with use of the PuraPly currently. Subsequently  we did review her culture results and to be honest I think that the Bactrim is probably the best option to have her continue at this point. For that reason I am get a go ahead and send in a refill today for her for an additional 2 weeks. Nonetheless I think that we are making excellent progress here. It was Enterococcus and Staphylococcus that were noted she is allergic to penicillin so there is not much I can do from the Enterococcus standpoint though the staff does seem to be sensitive to the Bactrim. 03/06/2021 upon evaluation today patient appears to be doing well with regard to her wound. This is definitely showing signs of improvement which is great news and overall I am extremely pleased with where things stand today. There does not appear to be any signs of active infection which is great news and overall and I do believe that we are headed in the appropriate direction I think the PuraPly is doing an awesome job for her. 03/20/2021 upon evaluation today patient actually appears to be doing well with regard to her wound. We have been using the PuraPly although last week when I was out of the office they actually just used endoform nonetheless this still seems to be doing great. Fortunately there does not appear to be any signs of active infection which is great news overall I think the patient is making wonderful progress. 03/27/2021 upon evaluation today patient appears to be doing well with regard to her wound in fact there is a little drainage but otherwise her does not appear to be any major signs of open wounds nor infection at this  time. Overall I am extremely pleased with where things stand. No fevers, chills, nausea, vomiting, or diarrhea. 04/03/2021 upon evaluation today patient presents for reevaluation here in the clinic and overall she seems to be doing quite well. Fortunately there does not appear to be any signs of active infection which is great news and in general I am very pleased with how things appear. There is still a very small opening remaining but in general she is looking much better. Fortunately I do not think there is any need for the PuraPly at this point. 04/10/2021 upon evaluation today patient's wound is actually showing signs of excellent improvement. I am actually very pleased with where it stands today. I think she is very close to complete closure in fact this may be healed today. Either way I think that she may benefit from using her own compression socks for the next week and then will subsequently see how things stand following. 04/17/2021 upon evaluation today patient appears to be doing well with regard to her wound on the ankle region. This is actually showing signs of excellent improvement which is great news. I do not see any signs of active infection at this time which is great and no open wound. With that being said on the lateral portion of her ankle right at the base of her plate she actually has an area of pressure that occurred over the past week when she was not in the wrap or we been padding her. This does seem to be an issue which I think is good to be an ongoing problem I am can actually get in touch with Dr. [name] today to see what we can do in that regard. 04/24/2021 upon evaluation today patient appears to be doing well with regard to her ankle region for the most part although there does appear to  be some fluid trapped underneath the area where we thought this was healed last week. Unfortunately I think at this time that we can have to reopen and clean away some of this area and  then subsequently depending on how things do over the next week or so we will see about discharge but right now I do not think that is the right way to go. She voiced understanding. 05/01/2021 upon evaluation today patient's wound still appears to be open unfortunately. I do believe that we do need to see what we can do about trying to get this closed I believe compression wrapping is probably can to be indicated here. Fortunately there is no signs of active infection at this time. 05/08/2021 upon evaluation today patient appears to be doing okay in regard to her ulcer this is very superficial and we will see any major issues here. The biggest problem currently that I find is that we just cannot get this area to completely seal up. She seemed to almost be and then things digressed a little bit. Right now is not too bad but we do need to see if we can make some difference here. I think maybe switching over to collagen may be better. 05/15/2021 upon evaluation today patient actually appears to be doing pretty well in regard to her wound. This is measuring a little smaller and looking much better. She did see orthopedics today there to me making her a insert for her shoe to try to help straighten out her gait. I think this can be beneficial for her as well. 05/22/2021 upon evaluation today patient appears to be doing well currently in regard to her wound although is not significantly smaller unfortunately this at least does not show any signs of active infection at this time which is great news. No fevers, chills, nausea, vomiting, or diarrhea. I am going to see about switching over to endoform that is what we wanted to use in the past always been on backorder. 06/05/2021 upon evaluation today patient appears to be doing somewhat poorly in regard to her wound. This is really not showing the signs of improvement that I would have like to have seen. Fortunately I do not see any signs of active infection  locally nor systemically which is great news. With that being said even though I do not see the sign she is having some increased discomfort which is not good. She is also having some issues here with the wound looking a little larger today compared to where its been. With that being said overall I believe that we probably need to consider at least that there may be a subacute infection here and that is what is going on. We have tried multiple different medications and dressings which have done well for her in the past without seeing any signs of improvement. She is in agreement with addressing this as such. 06/12/2020 upon evaluation today patient's wound is actually showing some signs of improvement today. In fact I do not even see any need for sharp debridement based on what I see at this point today. Fortunately there is no evidence of active infection currently which is great news. No fevers, chills, nausea, vomiting, or diarrhea. 06/26/2021 upon evaluation today patient appears to be doing well with regard to her wounds. Fortunately there does not appear to be any signs of active infection at this time which is great news. I do believe the Bactrim is doing well for her. Fortunately there does not  appear to be any signs of active infection at this time. With the hardware in place and everything else I do believe extending this for 2 additional weeks for 4 weeks total would be advisable. 07/03/2021 upon evaluation today patient appears to be doing well with regard to her wound. She has been tolerating the dressing changes which is great news. Fortunately there does not appear to be any evidence of active infection locally nor systemically at this time. 07/10/2021 upon evaluation today patient appears to be doing well with regard to her wound I do feel that the PuraPly has done an awesome job for her over the past week. Fortunately I do not see any signs of active infection at this time. No fevers,  chills, nausea, vomiting, or diarrhea. 2/15; the patient's wound is an inverted cone. Most of the walls of the colon are epithelialized however the tip of the conus is still open. I gather this was considered to be quite an improvement.We reapplied Puraply in the standard fashion Objective Constitutional Sitting or standing Blood Pressure is within target range for patient.. Pulse regular and within target range for patient.Marland Kitchen Respirations regular, non-labored and within target range.. Temperature is normal and within the target range for the patient.Marland Kitchen Appears in no distress. Vitals Time Taken: 9:52 AM, Height: 68 in, Temperature: 98.3 F, Pulse: 60 bpm, Respiratory Rate: 16 breaths/min, Blood Pressure: 120/67 mmHg. General Notes: Wound exam; most of the wound area is nicely epithelialized under illumination. Although there is still some depth and at the deepest part of this there is a very tiny nonepithelialized area remaining. No evidence of infection. No uncontrolled edema Integumentary (Hair, Skin) Wound #15R status is Open. Original cause of wound was Gradually Appeared. The date acquired was: 12/30/2019. The wound has been in treatment 78 weeks. The wound is located on the Left,Medial Malleolus. The wound measures 0.3cm length x 0.2cm width x 0.1cm depth; 0.047cm^2 area and 0.005cm^3 volume. There is Fat Layer (Subcutaneous Tissue) exposed. There is no tunneling or undermining noted. There is a medium amount of serosanguineous drainage noted. The wound margin is distinct with the outline attached to the wound base. There is large (67-100%) red, pink granulation within the wound bed. There is no necrotic tissue within the wound bed. 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 A skin graft procedure using a bioengineered skin substitute/cellular or tissue based product was performed by [name]., MD with the following instrument(s): N/A. Puraply AM was applied and secured with adaptic. 2 sq cm of product was utilized and 2 sq cm was wasted due to wound size. Post Application, no dressing was applied. A Time Out was conducted at 10:10, prior to the start of the procedure. The procedure was tolerated well with a pain level of 0 throughout and a pain level of 0 following the procedure. Post procedure Diagnosis Wound #15R: Same as Pre-Procedure . Pre-procedure diagnosis of Wound #15R is a Venous Leg Ulcer located on the Left,Medial Malleolus . There was a Double Layer Compression Therapy Procedure by Deon Pilling, RN. Post procedure Diagnosis Wound #15R: Same as Pre-Procedure Plan Follow-up Appointments: Return Appointment in 1 week. Margarita Grizzle Wednesday Room 9 Cellular or Tissue  Based Products: Cellular or Tissue Based Product Type: - Puraply AM #2 07/10/2021 Puraply AM for #3 07/17/2021 Puraply AM for #4 07/24/2021 Bathing/ Shower/ Hygiene: May shower with protection but do not get wound dressing(s) wet. Edema Control - Lymphedema / SCD / Other: Elevate legs to the level of the heart or above for 30 minutes daily and/or when sitting, a frequency of: - throughout the day when sitting Avoid standing for long periods of time. Patient to wear own compression stockings every day. Exercise regularly Moisturize legs daily. Non Wound Condition: Apply the following to affected area as directed: - Apply silicone bordered foam to left lateral foot for protection WOUND #15R: - Malleolus Wound Laterality: Left, Medial Cleanser: Wound Cleanser (Generic) 1 x Per Day/15 Days Discharge Instructions: Cleanse the wound with wound  cleanser prior to applying a clean dressing using gauze sponges, not tissue or cotton balls. Peri-Wound Care: Sween Lotion (Moisturizing lotion) 1 x Per Day/15 Days Discharge Instructions: Apply moisturizing lotion as directed Prim Dressing: Puraply AM 1 x Per TXM/46 Days ary Discharge Instructions: Provider applied to wound bed. Secondary Dressing: Woven Gauze Sponge, Non-Sterile 4x4 in (Generic) 1 x Per Day/15 Days Discharge Instructions: Apply over primary dressing as directed. Secondary Dressing: ABD Pad, 8x10 1 x Per Day/15 Days Discharge Instructions: Apply over primary dressing as directed. Secondary Dressing: ADAPTIC TOUCH 3x4.25 in 1 x Per Day/15 Days Discharge Instructions: Apply over primary dressing. Com pression Wrap: CoFlex TLC XL 2-layer Compression System 4x7 (in/yd) 1 x Per Day/15 Days Discharge Instructions: Apply CoFlex 2-layer compression as directed. (alt for 4 layer) 1. Puraply reapplied in the standard fashion. 2. Need to look at this carefully under illumination next week this may be fully epithelialized albeit in a divot Electronic Signature(s) Signed: 07/30/2021 3:24:27 PM By: Deon Pilling RN, BSN Signed: 07/30/2021 4:14:30 PM By: Linton Ham MD Previous Signature: 07/17/2021 4:36:32 PM Version By: Linton Ham MD Entered By: Deon Pilling on 07/30/2021 15:23:40 -------------------------------------------------------------------------------- SuperBill Details Patient Name: Date of Service: [name], [name] 07/17/2021 Medical Record Number: [id number] Patient Account Number: [id number] Date of Birth/Sex: Treating RN: 12-09-1955 (66 y.o. [name], Meta.Reding Primary Care Provider: [name] Other Clinician: Referring Provider: Treating Provider/Extender: [name] in Treatment: 78 Diagnosis Coding ICD-10 Codes Code Description [id number] Chronic venous hypertension (idiopathic) with ulcer and inflammation of left lower  extremity L97.828 Non-pressure chronic ulcer of other part of left lower leg with other specified severity L97.328 Non-pressure chronic ulcer of left ankle with other specified severity Facility Procedures CPT4 Code: 04888916 Description: X4503 - PuraPly Product AM 2X2 (4 sq. cm) Modifier: Quantity: 4 CPT4 Code: 88828003 Description: 49179 - SKIN SUB GRAFT TRNK/ARM/LEG ICD-10 Diagnosis Description L97.828 Non-pressure chronic ulcer of other part of left lower leg with other specified s Modifier: everity Quantity: 1 Physician Procedures : CPT4 Code Description Modifier [id number] 94801 - WC PHYS SKIN SUB GRAFT TRNK/ARM/LEG ICD-10 Diagnosis Description L97.828 Non-pressure chronic ulcer of other part of left lower leg with other specified severity Quantity: 1 Electronic Signature(s) Signed: 07/30/2021 3:24:27 PM By: Deon Pilling RN, BSN Signed: 07/30/2021 4:14:30 PM By: Linton Ham MD Previous Signature: 07/17/2021 4:36:32 PM Version By: Linton Ham MD Entered By: Deon Pilling on 07/30/2021 15:24:09

## 2021-07-17 NOTE — Progress Notes (Signed)
CHERELL, COLVIN ([id number]) Visit Report for 07/17/2021 Arrival Information Details Patient Name: Date of Service: [place] [name], [name] 07/17/2021 9:30 A M Medical Record Number: [id number] Patient Account Number: [id number] Date of Birth/Sex: Treating RN: [birth year] (66 y.o. [name], [name] Primary Care Muaad Boehning: [name] Other Clinician: Referring Nirali Magouirk: Treating Etna Forquer/Extender: [name] in Treatment: 45 Visit Information History Since Last Visit Added or deleted any medications: No Patient Arrived: Cane Any new allergies or adverse reactions: No Arrival Time: 09:48 Had a fall or experienced change in No Accompanied By: self activities of daily living that may affect Transfer Assistance: None risk of falls: Patient Identification Verified: Yes Signs or symptoms of abuse/neglect since last visito No Secondary Verification Process Completed: Yes Hospitalized since last visit: No Patient Requires Transmission-Based Precautions: No Implantable device outside of the clinic excluding No Patient Has Alerts: Yes cellular tissue based products placed in the center Patient Alerts: L ABI =1.12, TBI = .92 since last visit: R ABI= 1.02, TBI= .58 Has Dressing in Place as Prescribed: Yes Has Compression in Place as Prescribed: Yes Pain Present Now: No Electronic Signature(s) Signed: 07/17/2021 5:30:32 PM By: Deon Pilling RN, BSN Entered By: Deon Pilling on 07/17/2021 09:51:33 -------------------------------------------------------------------------------- Compression Therapy Details Patient Name: Date of Service: [name], [name] 07/17/2021 9:30 A M Medical Record Number: [id number] Patient Account Number: [id number] Date of Birth/Sex: Treating RN: [birth year] (66 y.o. [name] Primary Care Elishah Ashmore: [name] Other Clinician: Referring Jacody Beneke: Treating Kentarius Partington/Extender: [name] in Treatment:  78 Compression Therapy Performed for Wound Assessment: Wound #15R Left,Medial Malleolus Performed By: Clinician Deon Pilling, RN Compression Type: Double Layer Post Procedure Diagnosis Same as Pre-procedure Electronic Signature(s) Signed: 07/17/2021 5:30:32 PM By: Deon Pilling RN, BSN Entered By: Deon Pilling on 07/17/2021 10:14:58 -------------------------------------------------------------------------------- Encounter Discharge Information Details Patient Name: Date of Service: [name], [name] 07/17/2021 9:30 A M Medical Record Number: [id number] Patient Account Number: [id number] Date of Birth/Sex: Treating RN: 28-Jun-1955 (66 y.o. [name] Primary Care Keyoni Lapinski: [name] Other Clinician: Referring Khadeja Abt: Treating Kimball Manske/Extender: [name] in Treatment: 3 Encounter Discharge Information Items Post Procedure Vitals Discharge Condition: Stable Temperature (F): 98.3 Ambulatory Status: Cane Pulse (bpm): 60 Discharge Destination: Home Respiratory Rate (breaths/min): 16 Transportation: Private Auto Blood Pressure (mmHg): 120/67 Accompanied By: self Schedule Follow-up Appointment: Yes Clinical Summary of Care: Electronic Signature(s) Signed: 07/17/2021 5:30:32 PM By: Deon Pilling RN, BSN Entered By: Deon Pilling on 07/17/2021 10:22:52 -------------------------------------------------------------------------------- Lower Extremity Assessment Details Patient Name: Date of Service: [facility] [name], [name] 07/17/2021 9:30 A M Medical Record Number: [id number] Patient Account Number: [id number] Date of Birth/Sex: Treating RN: [birth year] (66 y.o. [name] Primary Care Brody Kump: [name] Other Clinician: Referring Jaxton Casale: Treating Eugenie Harewood/Extender: [name] in Treatment: 78 Edema Assessment Assessed: [name]: Yes] [Right: No] Edema: [Left: N] [Right: o] Calf Left: Right: Point of  Measurement: 31 cm From Medial Instep 27 cm Ankle Left: Right: Point of Measurement: 11 cm From Medial Instep 21.5 cm Vascular Assessment Pulses: Dorsalis Pedis Palpable: [Left:Yes] Electronic Signature(s) Signed: 07/17/2021 5:30:32 PM By: Deon Pilling RN, BSN Entered By: Deon Pilling on 07/17/2021 10:02:32 -------------------------------------------------------------------------------- Multi Wound Chart Details Patient Name: Date of Service: [name], [name] 07/17/2021 9:30 A M Medical Record Number: [id number] Patient Account Number: [id number] Date of Birth/Sex: Treating RN: March 01, 1956 (66 y.o. [name] Reza Crymes: Barrie Folk,  Noelle Other Clinician: Referring Provider: Treating Provider/Extender: [name] in Treatment: 77 Vital Signs Height(in): 68 Pulse(bpm): 60 Weight(lbs): Blood Pressure(mmHg): 120/67 Body Mass Index(BMI): Temperature(F): 98.3 Respiratory Rate(breaths/min): 16 Photos: [N/A:N/A] Left, Medial Malleolus N/A N/A Wound Location: Gradually Appeared N/A N/A Wounding Event: Venous Leg Ulcer N/A N/A Primary Etiology: Peripheral Venous Disease, N/A N/A Comorbid History: Osteoarthritis, Confinement Anxiety 12/30/2019 N/A N/A Date Acquired: 45 N/A N/A Weeks of Treatment: Open N/A N/A Wound Status: Yes N/A N/A Wound Recurrence: 0.3x0.2x0.1 N/A N/A Measurements L x W x D (cm) 0.047 N/A N/A A (cm) : rea 0.005 N/A N/A Volume (cm) : 99.60% N/A N/A % Reduction in Area: 99.90% N/A N/A % Reduction in Volume: Full Thickness With Exposed Support N/A N/A Classification: Structures Medium N/A N/A Exudate Amount: Serosanguineous N/A N/A Exudate Type: red, brown N/A N/A Exudate Color: Distinct, outline attached N/A N/A Wound Margin: Large (67-100%) N/A N/A Granulation Amount: Red, Pink N/A N/A Granulation Quality: None Present (0%) N/A N/A Necrotic Amount: Fat Layer (Subcutaneous Tissue):  Yes N/A N/A Exposed Structures: Fascia: No Tendon: No Muscle: No Joint: No Bone: No Large (67-100%) N/A N/A Epithelialization: Cellular or Tissue Based Product N/A N/A Procedures Performed: Compression Therapy Treatment Notes Wound #15R (Malleolus) Wound Laterality: Left, Medial Cleanser Wound Cleanser Discharge Instruction: Cleanse the wound with wound cleanser prior to applying a clean dressing using gauze sponges, not tissue or cotton balls. Peri-Wound Care Sween Lotion (Moisturizing lotion) Discharge Instruction: Apply moisturizing lotion as directed Topical Primary Dressing Puraply AM Discharge Instruction: Provider applied to wound bed. Secondary Dressing Woven Gauze Sponge, Non-Sterile 4x4 in Discharge Instruction: Apply over primary dressing as directed. ABD Pad, 8x10 Discharge Instruction: Apply over primary dressing as directed. ADAPTIC TOUCH 3x4.25 in Discharge Instruction: Apply over primary dressing. Secured With Compression Wrap CoFlex TLC XL 2-layer Compression System 4x7 (in/yd) Discharge Instruction: Apply CoFlex 2-layer compression as directed. (alt for 4 layer) Compression Stockings Add-Ons Electronic Signature(s) Signed: 07/17/2021 4:36:32 PM By: Linton Ham MD Signed: 07/17/2021 5:14:15 PM By: Baruch Gouty RN, BSN Entered By: Linton Ham on 07/17/2021 10:36:33 -------------------------------------------------------------------------------- Multi-Disciplinary Care Plan Details Patient Name: Date of Service: [facility] [name], [name] 07/17/2021 9:30 A M Medical Record Number: [id number] Patient Account Number: [id number] Date of Birth/Sex: Treating RN: [birth year] (66 y.o. [name] Primary Care Provider: [name] Other Clinician: Referring Provider: Treating Provider/Extender: [name] in Treatment: 45 Multidisciplinary Care Plan reviewed with physician Active Inactive Venous Leg Ulcer Nursing  Diagnoses: Actual venous Insuffiency (use after diagnosis is confirmed) Knowledge deficit related to disease process and management Goals: Patient will maintain optimal edema control Date Initiated: 06/20/2020 Target Resolution Date: 08/02/2021 Goal Status: Active Interventions: Assess peripheral edema status every visit. Compression as ordered Treatment Activities: Therapeutic compression applied : 06/20/2020 Notes: 01/30/21: Edema control ongoing, using CoFlex wrap. 06/05/21: Edema control continues Wound/Skin Impairment Nursing Diagnoses: Impaired tissue integrity Knowledge deficit related to ulceration/compromised skin integrity Goals: Patient/caregiver will verbalize understanding of skin care regimen Date Initiated: 01/17/2020 Target Resolution Date: 08/03/2021 Goal Status: Active Ulcer/skin breakdown will have a volume reduction of 30% by week 4 Date Initiated: 01/17/2020 Date Inactivated: 03/12/2020 Target Resolution Date: 03/09/2020 Goal Status: Met Interventions: Assess patient/caregiver ability to obtain necessary supplies Assess patient/caregiver ability to perform ulcer/skin care regimen upon admission and as needed Assess ulceration(s) every visit Provide education on ulcer and skin care Notes: 01/30/21: Wound care regimen continues, PuraPly skin sub being used. 04/17/21: Wound healed 06/05/21: Wound re-opened,  wound care regimen continues. Electronic Signature(s) Signed: 07/17/2021 5:30:32 PM By: Deon Pilling RN, BSN Entered By: Deon Pilling on 07/17/2021 10:12:20 -------------------------------------------------------------------------------- Pain Assessment Details Patient Name: Date of Service: [name], [name] 07/17/2021 9:30 A M Medical Record Number: [id number] Patient Account Number: [id number] Date of Birth/Sex: Treating RN: 12-04-1955 (66 y.o. [name] Primary Care Ellamay Fors: [name] Other Clinician: Referring Inetta Dicke: Treating  Ritter Helsley/Extender: [name] in Treatment: 53 Active Problems Location of Pain Severity and Description of Pain Patient Has Paino No Site Locations Rate the pain. Current Pain Level: 0 Pain Management and Medication Current Pain Management: Medication: No Cold Application: No Rest: No Massage: No Activity: No T.E.N.S.: No Heat Application: No Leg drop or elevation: No Is the Current Pain Management Adequate: Adequate How does your wound impact your activities of daily livingo Sleep: No Bathing: No Appetite: No Relationship With Others: No Bladder Continence: No Emotions: No Bowel Continence: No Work: No Toileting: No Drive: No Dressing: No Hobbies: No Engineer, maintenance) Signed: 07/17/2021 5:30:32 PM By: Deon Pilling RN, BSN Entered By: Deon Pilling on 07/17/2021 09:53:35 -------------------------------------------------------------------------------- Patient/Caregiver Education Details Patient Name: Date of Service: [name], [name] 2/15/2023andnbsp9:30 A M Medical Record Number: [id number] Patient Account Number: [id number] Date of Birth/Gender: Treating RN: Jun 24, 1955 (66 y.o. [name] Primary Care Physician: [name] Other Clinician: Referring Physician: Treating Physician/Extender: [name] in Treatment: 58 Education Assessment Education Provided To: Patient Education Topics Provided Wound/Skin Impairment: Handouts: Skin Care Do's and Dont's Methods: Explain/Verbal Responses: Reinforcements needed Electronic Signature(s) Signed: 07/17/2021 5:30:32 PM By: Deon Pilling RN, BSN Entered By: Deon Pilling on 07/17/2021 10:14:38 -------------------------------------------------------------------------------- Wound Assessment Details Patient Name: Date of Service: [name], [name] 07/17/2021 9:30 A M Medical Record Number: [id number] Patient Account Number: [id number] Date of  Birth/Sex: Treating RN: 1955-08-19 (66 y.o. [name], [name] Primary Care Ashlie Mcmenamy: [name] Other Clinician: Referring Zeev Deakins: Treating Anh Mangano/Extender: [name] in Treatment: 78 Wound Status Wound Number: 15R Primary Venous Leg Ulcer Etiology: Wound Location: Left, Medial Malleolus Wound Status: Open Wounding Event: Gradually Appeared Comorbid Peripheral Venous Disease, Osteoarthritis, Confinement Date Acquired: 12/30/2019 History: Anxiety Weeks Of Treatment: 78 Clustered Wound: No Photos Wound Measurements Length: (cm) 0.3 Width: (cm) 0.2 Depth: (cm) 0.1 Area: (cm) 0.047 Volume: (cm) 0.005 % Reduction in Area: 99.6% % Reduction in Volume: 99.9% Epithelialization: Large (67-100%) Tunneling: No Undermining: No Wound Description Classification: Full Thickness With Exposed Support Structures Wound Margin: Distinct, outline attached Exudate Amount: Medium Exudate Type: Serosanguineous Exudate Color: red, brown Foul Odor After Cleansing: No Slough/Fibrino No Wound Bed Granulation Amount: Large (67-100%) Exposed Structure Granulation Quality: Red, Pink Fascia Exposed: No Necrotic Amount: None Present (0%) Fat Layer (Subcutaneous Tissue) Exposed: Yes Tendon Exposed: No Muscle Exposed: No Joint Exposed: No Bone Exposed: No Treatment Notes Wound #15R (Malleolus) Wound Laterality: Left, Medial Cleanser Wound Cleanser Discharge Instruction: Cleanse the wound with wound cleanser prior to applying a clean dressing using gauze sponges, not tissue or cotton balls. Peri-Wound Care Sween Lotion (Moisturizing lotion) Discharge Instruction: Apply moisturizing lotion as directed Topical Primary Dressing Puraply AM Discharge Instruction: Shamell Suarez applied to wound bed. Secondary Dressing Woven Gauze Sponge, Non-Sterile 4x4 in Discharge Instruction: Apply over primary dressing as directed. ABD Pad, 8x10 Discharge Instruction: Apply  over primary dressing as directed. ADAPTIC TOUCH 3x4.25 in Discharge Instruction: Apply over primary dressing. Secured With Compression Wrap CoFlex TLC XL 2-layer Compression System 4x7 (  in/yd) Discharge Instruction: Apply CoFlex 2-layer compression as directed. (alt for 4 layer) Compression Stockings Add-Ons Electronic Signature(s) Signed: 07/17/2021 5:30:32 PM By: Deon Pilling RN, BSN Signed: 07/17/2021 5:30:32 PM By: Deon Pilling RN, BSN Entered By: Deon Pilling on 07/17/2021 10:03:59 -------------------------------------------------------------------------------- Vitals Details Patient Name: Date of Service: [name], [name] 07/17/2021 9:30 A M Medical Record Number: [id number] Patient Account Number: [id number] Date of Birth/Sex: Treating RN: [birth year] (66 y.o. [name], [name] Primary Care Emera Bussie: [name] Other Clinician: Referring Mishel Sans: Treating Orlander Norwood/Extender: [name] in Treatment: 30 Vital Signs Time Taken: 09:52 Temperature (F): 98.3 Height (in): 68 Pulse (bpm): 60 Respiratory Rate (breaths/min): 16 Blood Pressure (mmHg): 120/67 Reference Range: 80 - 120 mg / dl Electronic Signature(s) Signed: 07/17/2021 5:30:32 PM By: Deon Pilling RN, BSN Entered By: Deon Pilling on 07/17/2021 09:53:25

## 2021-07-24 ENCOUNTER — Encounter (HOSPITAL_BASED_OUTPATIENT_CLINIC_OR_DEPARTMENT_OTHER): Payer: Medicare Other | Admitting: Physician Assistant

## 2021-07-24 ENCOUNTER — Other Ambulatory Visit: Payer: Self-pay

## 2021-07-24 DIAGNOSIS — I87332 Chronic venous hypertension (idiopathic) with ulcer and inflammation of left lower extremity: Secondary | ICD-10-CM | POA: Diagnosis not present

## 2021-07-24 NOTE — Progress Notes (Addendum)
CHASITEE, ZENKER ([id number]) Visit Report for 07/24/2021 Chief Complaint Document Details Patient Name: Date of Service: [facility] [name], [name] 07/24/2021 10:00 A M Medical Record Number: [id number] Patient Account Number: [id number] Date of Birth/Sex: Treating RN: 12-23-1955 (66 y.o. [name] Provider: [name] Other Clinician: Referring Provider: Treating Provider/Extender: [name] in Treatment: 75 Information Obtained from: Patient Chief Complaint patient is been followed long-term in this clinic for venous insufficiency ulcers with inflammation, hypertension and ulceration over the medial ankle bilaterally. 01/17/2020; this is a patient who is here for review of postoperative wounds on the left lateral ankle and recurrence of venous stasis ulceration on the left medial Electronic Signature(s) Signed: 07/24/2021 10:16:18 AM By: [name] PA-C Entered By: [name] on 07/24/2021 10:16:18 -------------------------------------------------------------------------------- Debridement Details Patient Name: Date of Service: [facility] [name], [name] 07/24/2021 10:00 A M Medical Record Number: [id number] Patient Account Number: [id number] Date of Birth/Sex: Treating RN: 15-Nov-1955 (66 y.o. [name] Provider: [name] Other Clinician: Referring Provider: Treating Provider/Extender: [name] in Treatment: 79 Debridement Performed for Assessment: Wound #15R Left,Medial Malleolus Performed By: Physician [name], PA Debridement Type: Debridement Severity of Tissue Pre Debridement: Fat layer exposed Level of Consciousness (Pre-procedure): Awake and Alert Pre-procedure Verification/Time Out Yes - 10:35 Taken: Pain Control: Lidocaine 4% T opical Solution T Area Debrided (L x W): otal 0.8 (cm) x 0.8 (cm) = 0.64 (cm) Tissue and other material debrided: Non-Viable, Callus,  Skin: Epidermis Level: Skin/Epidermis Debridement Description: Selective/Open Wound Instrument: Curette Bleeding: Minimum Hemostasis Achieved: Pressure Procedural Pain: 1 Post Procedural Pain: 0 Response to Treatment: Procedure was tolerated well Level of Consciousness (Post- Awake and Alert procedure): Post Debridement Measurements of Total Wound Length: (cm) 0.3 Width: (cm) 0.2 Depth: (cm) 0.1 Volume: (cm) 0.005 Character of Wound/Ulcer Post Debridement: Improved Severity of Tissue Post Debridement: Fat layer exposed Post Procedure Diagnosis Same as Pre-procedure Electronic Signature(s) Signed: 07/24/2021 4:57:32 PM By: [name] PA-C Signed: 07/24/2021 5:17:37 PM By: Baruch Gouty RN, BSN Entered By: Baruch Gouty on 07/24/2021 10:41:52 -------------------------------------------------------------------------------- HPI Details Patient Name: Date of Service: [name], [name] 07/24/2021 10:00 A M Medical Record Number: [id number] Patient Account Number: [id number] Date of Birth/Sex: Treating RN: 18-Sep-1955 (66 y.o. [name] Provider: [name] Other Clinician: Referring Provider: Treating Provider/Extender: [name] in Treatment: 45 History of Present Illness HPI Description: the remaining wound is over the left medial ankle. Similar wound over the right medial ankle healed largely with use of Apligraf. Most recently we have been using Hydrofera Blue over this wound with considerable improvement. The patient has been extensively worked up in the past for her venous insufficiency and she is not a candidate for antireflux surgery although I have none of the details available currently. 08/24/14; considerable improvement today. About 50% of this wound areas now epithelialized. The base of the wound appears to be healthier granulation.as opposed to last week when she had deteriorated a considerable  improvement 08/17/14; unfortunately the wound has regressed somewhat. The areas of epithelialization from the superior aspect are not nearly as healthy as they were last week. The patient thinks her Hydrofera Blue slipped. 09/07/14; unfortunately the area has markedly regressed in the 2 weeks since I've seen this. There is an odor surrounding erythema. The healthy granulation tissue that we had at the base of the  wound now is a dusky color. The nurse reports green drainage 09/14/14; the area looks somewhat better than last week. There is less erythema and less drainage. The culture I did did not show any growth. Nevertheless I think it is better to continue the Cipro and doxycycline for a further week. The remaining wound area was debridement. 09/21/14. Wound did not require debridement last week. Still less erythema and less drainage. She can complete her antibiotics. The areas of epithelialization in the superior aspect of the wound do not look as healthy as they did some weeks ago 10/05/14 continued improvement in the condition of this wound. There is advancing epithelialization. Less aggressive debridement required 10/19/14 continued improvement in the condition and volume of this wound. Less aggressive debridement to the inferior part of this to remove surface slough and fibrinous eschar 11/02/14 no debridement is required. The surface granulation appears healthy although some of her islands of epithelialization seem to have regressed. No evidence of infection 11/16/14; lites surface debridement done of surface eschar. The wound does not look to be unhealthy. No evidence of infection. Unfortunately the patient has had podiatry issues in the right foot and for some reason has redeveloped small surface ulcerations in the medial right ankle. Her original presentation involved wounds in this area 11/23/14 no debridement. The area on the right ankle has enlarged. The left ankle wound appears stable in terms of  the surface although there is periwound inflammation. There has been regression in the amount of new skin 11/30/14 no debridement. Both wound areas appear healthy. There was no evidence of infection. The the new area on the right medial ankle has enlarged although that both the surfaces appear to be stable. 12/07/14; Debridement of the right medial ankle wound. No no debridement was done on the left. 12/14/14 no major change in and now bilateral medial ankle wounds. Both of these are very painful but the no overt evidence of infection. She has had previous venous ablation 12/21/14; patient states that her right medial ankle wound is considerably more painful last week than usual. Her left is also somewhat painful. She could not tolerate debridement. The right medial ankle wound has fibrinous surface eschar 12/28/14 this is a patient with severe bilateral venous insufficiency ulcers. For a considerable period of time we actually had the one on the right medial ankle healed however this recently opened up again in June. The left medial ankle wound has been a refractory area with some absent flows. We had some success with Hydrofera Blue on this area and it literally closed by 50% however it is recently opened up Foley. Both of these were debridement today of surface eschar. She tolerates this poorly 01/25/15: No change in the status of this. Thick adherent escar. Very poor tolerance of any attempt at debridement. I had healed the right medial malleolus wound for a considerable amount of time and had the left one down to about 50% of the volume although this is totally regressed over the last 48 weeks. Further the right leg has reopened. she is trying to make a appointment with pain and vascular, previous ablations with Dr. Aleda Grana. I do not believe there is an arterial insufficiency issue here 02/01/15 the status of the adherent eschar bilaterally is actually improved. No debridement was done. She did not  manage to get vascular studies done 02/08/15 continued debridement of the area was done today. The slough is less adherent and comes off with less pressure. There is no surrounding infection  peripheral pulses are intact 02/15/15 selective debridement with a disposable curette. Again the slough is less adherent and comes off with less difficulty. No surrounding infection peripheral pulses are intact. 02/22/15 selective debridement of the right medial ankle wound. Slough comes off with less difficulty. No obvious surrounding infection peripheral pulses are intact I did not debridement the one on the left. Both of these are stable to improved 03/01/15 selective debridement of both wound areas using a curette to. Adherent slough cup soft with less difficulty. No obvious surrounding infection. The patient tells me that 2 days ago she noted a rash above the right leg wrap. She did not have this on her lower legs when she change this over she arrives with widespread left greater than right almost folliculitis-looking rash which is extremely pruritic. I don't see anything to culture here. There is no rash on the rest of her body. She feels well systemically. 03/08/15; selective debridement of both wounds using a curette. Base of this does not look unhealthy. She had limegreen drainage coming out of the left leg wound and describes a lot of drainage. The rash on her left leg looks improved to. No cultures were done. 03/22/15; patient was not here last week. Basal wounds does not look healthy and there is no surrounding erythema. No drainage. There is still a rash on the left leg that almost looks vasculitic however it is clearly limited to the top of where the wrap would be. 04/05/15; on the right required a surgical debridement of surface eschar and necrotic subcutaneous tissue. I did not debridement the area on the left. These continue to be large open wounds that are not changing that much. We were successful at  one point in healing the area on the right, and at the same time the area on the left was roughly half the size of current measurements. I think a lot of the deterioration has to do with the prolonged time the patient is on her feet at work 04/19/15 I attempted-like surface debridement bilaterally she does not tolerate this. She tells me that she was in allergic care yesterday with extreme pain over her left lateral malleolus/ankle and was told that she has an "sprain" 05/03/15; large bilateral venous insufficiency wounds over the medial malleolus/medial aspect of her ankles. She complains of copious amounts of drainage and his usual large amounts of pain. There is some increasing erythema around the wound on the right extending into the medial aspect of her foot to. historically she came in with these wounds the right one healed and the left one came down to roughly half its current size however the right one is reopened and the left is expanded. 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 05/10/15 large bilateral venous insufficiency wounds. There is less adherence surface left however the surface culture that I did last week grew pseudomonas therefore bilateral selective debridement score necessary. There is surrounding erythema. The patient describes severe bilateral drainage and a lot of pain in the left ankle. Apparently her podiatrist was were ready to do a cortisone shot 05/17/15; the patient complains of pain and again copious amounts of drainage. 05/24/15; we used Iodo flex last week. Patient notes considerable improvement in wound drainage. Only needed to change this once. 05/31/15; we continued Iodoflex; the base of these large wounds bilaterally is not too bad but there is probably likely a significant bioburden here. I would  like to debridement just doesn't tolerate it. 06/06/14 I would like to continue the Iodoflex although she still hasn't  managed to obtain supplies. She has bilateral medial malleoli or large wounds which are mostly superficial. Both of them are covered circumferentially with some nonviable fibrinous slough although she tolerates debridement very poorly. She apparently has an appointment for an ablation on the right leg by interventional radiology. 06/14/15; the patient arrives with the wounds and static condition. We attempted a debridement although she does not do well with this secondary to pain. I 07/05/15; wounds are not much smaller however there appears to be a cleaner granulating base. The left has tight fibrinous slough greater than the right. Debridement is tolerated poorly due to pain. Iodoflex is done more for these wounds in any of the multitude of different dressings I have tried on the left 1 and then subsequently the right. 07/12/15; no change in the condition of this wound. I am able to do an aggressive debridement on the right but not the left. She simply cannot tolerate it. We have been using Iodoflex which helps somewhat. It is worthwhile remembering that at one point we healed the right medial ankle wound and the left was about 25% of the current circumference. We have suggested returning to vascular surgery for review of possible further ablations for one reason or another she has not been able to do this. 07/26/15 no major change in the condition of either wound on her medial ankle. I did not attempt to debridement of these. She has been aggressively scrubbing these while she is in the shower at home. She has her supply of Iodoflex which seems to have done more for these wounds then anything I have put on recently. 08/09/15 wound area appears larger although not verified by measurements. Using Iodoflex 09/05/2015 -- she was here for avisit today but had significant problems with the wound and I was asked to see her for a physician opinion. I have summarize that this lady has had surgery on her left lower  extremity about 10 years ago where the possible veins stripping was done. She has had an opinion from interventional radiology around November 2016 where no further sclerotherapy was ordered. The patient works 12 hours a day and stands on a concrete floor with work boots and is unable to get the proper compression she requires and cannot elevate her limbs appropriately at any given time. She has recently grown Pseudomonas from her wound culture but has not started her ciprofloxacin which was called in for her. 09/13/15 this continues to be a difficult situation for this patient. At one point I had this wound down to a 1.5 x 1.5" wound on her left leg. This is deteriorated and the right leg has reopened. She now has substantial wounds on her medial calcaneus, malleoli and into her lower leg. One on the left has surface eschar but these are far too painful for me to debridement here. She has a vascular surgery appointment next week to see if anything can be done to help here. I think she has had previous ablations several years ago at [facility]. She has no major edema. She tells me that she did not get product last time [facility] Ag] and went for several days without it. She continues to work in work boots 12 hours a day. She cannot get compression/4-layer under her work boots. 09/20/15 no major change. Periwound edema control was not very good. Her point with pain and vascular  is next Wednesday the 25th 09/28/15; the patient is seen vascular surgery and is apparently scheduled for repeat duplex ultrasounds of her bilateral lower legs next week. 10/05/15; the patient was seen by Dr. Doren Custard of vascular surgery. He feels that she should have arterial insufficiency excluded as cause/contributed to her nonhealing stage she is therefore booked for an arteriogram. She has apparently monophasic signals in the dorsalis pedis pulses. She also of course has known severe chronic venous insufficiency with previous  procedures as noted previously. I had another long discussion with the patient today about her continuing to work 12 hour shifts. I've written her out for 2 months area had concerns about this as her work location is currently undergoing significant turmoil and this may lead to her termination. She is aware of this however I agree with her that she simply cannot continue to stand for 12 hours multiple days a week with the substantial wound areas she has. 10/19/15; the Dr. Doren Custard appointment was largely for an arteriogram which was normal. She does not have an arterial issue. He didn't make a comment about her chronic venous insufficiency for which she has had previous ablations. Presumably it was not felt that anything additional could be done. The patient is now out of work as I prescribed 2 weeks ago. Her wounds look somewhat less aggravated presumably because of this. I felt I would give debridement another try today 10/25/15; no major change in this patient's wounds. We are struggling to get her product that she can afford into her own home through her insurance. 11/01/15; no major change in the patient's wounds. I have been using silver alginate as the most affordable product. I spoke to Dr. Marla Roe last week with her requested take her to the OR for surgical debridement and placement of ACEL. Dr. Marla Roe told me that she would be willing to do this however [facility] will not cover this, fortunately the patient has [place] healthcare of some variant 11/08/15; no major change in the patient's wounds. She has been completely nonviable surface that this but is in too much pain with any attempted debridement are clinic. I have arranged for her to see Dr. Marla Roe ham of plastic surgery and this appointment is on Monday. I am hopeful that they will take her to the OR for debridement, possible ACEL ultimately possible skin graft 11/22/15 no major change in the patient's wounds over her  bilateral medial calcaneus medial malleolus into the lower legs. Surface on these does not look too bad however on the left there is surrounding erythema and tenderness. This may be cellulitis or could him sleepy tinea. 11/29/15; no major changes in the patient's wounds over her bilateral medial malleolus. There is no infection here and I don't think any additional antibiotics are necessary. There is now plan to move forward. She sees Dr. Marla Roe in a week's time for preparation for operative debridement and ACEL placement I believe on 7/12. She then has a follow-up appointment with Dr. Marla Roe on 7/21 12/28/15; the patient returns today having been taken to the [place] by Dr. Marla Roe 12/12/15 she underwent debridement, intraoperative cultures [which were negative]. She had placement of a wound VAC. Parent really ACEL was not available to be placed. The wound VAC foam apparently adhered to the wound since then she's been using silver alginate, Xeroform under Ace wraps. She still says there is a lot of drainage and a lot of pain 01/31/16; this is a patient I see monthly. I  had referred her to Dr. Marla Roe him of plastic surgery for large wounds on her bilateral medial ankles. She has been to the OR twice once in early July and once in early August. She tells me over the last 3 weeks she has been using the wound VAC with ACEL underneath it. On the right we've simply been using silver alginate. Under Kerlix Coban wraps. 02/28/16; this is a patient I'm currently seeing monthly. She is gone on to have a skin graft over her large venous insufficiency ulcer on the left medial ankle. This was done by Dr. Marla Roe him. The patient is a bit perturbed about why she didn't have one on her right medial ankle wound. She has been using silver alginate to this. 03/06/16; I received a phone call from her plastic surgery Dr. Marla Roe. She expressed some concern about the viability of the skin graft she did on the  left medial ankle wound. Asked me to place Endoform on this. She told me she is not planning to do a subsequent skin graft on the right as the left one did not take very well. I had placed Hydrofera Blue on the right 03/13/16; continue to have a reasonably healthy wound on the right medial ankle. Down to 3 mm in terms of size. There is epithelialization here. The area on the left medial ankle is her skin graft site. I suppose the last week this looks somewhat better. She has an open area inferiorly however in the center there appears to be some viable tissue. There is a lot of surface callus and eschar that will eventually need to come off however none of this looked to be infected. Patient states that the is able to keep the dressing on for several days which is an improvement. 03/20/16 no major change in the circumference of either wound however on the left side the patient was at Dr. Eusebio Friendly office and they did a debridement of left wound. 50% of the wound seems to be epithelialized. I been using Endoform on the left Hydrofera Blue in the right 03/27/16; she arrives today with her wound is not looking as healthy as they did last week. The area on the right clearly has an adherent surface to this a very similar surface on the left. Unfortunately for this patient this is all too familiar problem. Clearly the Endoform is not working and will need to change that today that has some potential to help this surface. She does not tolerate debridement in this clinic very well. She is changing the dressing wants 04/03/16; patient arrives with the wounds looking somewhat better especially on the right. Dr. Migdalia Dk change the dressing to silver alginate when she saw her on Monday and also sold her some compression socks. The usefulness of the latter is really not clear and woman with severely draining wounds. 04/10/16; the patient is doing a bit of an experiment wearing the compression stockings that Dr.  Migdalia Dk provided her to her left leg and the out of legs based dressings that we provided to the right. 05/01/16; the patient is continuing to wear compression stockings Dr. Migdalia Dk provided her on the left that are apparently silver impregnated. She has been using Iodoflex to the right leg wound. Still a moderate amount of drainage, when she leaves here the wraps only last for 4 days. She has to change the stocking on the left leg every night 05/15/16; she is now using compression stockings bilaterally provided by Dr. Marla Roe. She is wearing a  nonadherent layer over the wounds so really I don't think there is anything specific being done to this now. She has some reduction on the left wound. The right is stable. I think all healing here is being done without a specific dressing 06/09/16; patient arrives here today with not much change in the wound certainly in diameter to large circular wounds over the medial aspect of her ankle bilaterally. Under the light of these services are certainly not viable for healing. There is no evidence of surrounding infection. She is wearing compression stockings with some sort of silver impregnation as prescribed by Dr. Marla Roe. She has a follow-up with her tomorrow. 06/30/16; no major change in the size or condition of her wounds. These are still probably covered with a nonviable surface. She is using only her purchase stockings. She did see Dr. Marla Roe who seemed to want to apply Dakin's solution to this I'm not extreme short what value this would be. I would suggest Iodoflex which she still has at home. 07/28/16; I follow Mrs. Laneve episodically along with Dr. Marla Roe. 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 She has been applying a topical collagen ointment to both wounds with Adaptic. I don't think Dr. Marla Roe is planning to take her back to the OR. 08/19/16; I follow Mrs. Jeneen Rinks on a  monthly basis along with Dr. Marla Roe of plastic surgery. 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 I been following her for a number of years. At one point I was able to get the right medial malleolus wound to heal and had the left medial malleolus down to about half its current size however and I had to send her to plastic surgery for an operative debridement. Since then things have been stable to slightly improve the area on the right is slightly better one in the left about the same although there is much less adherent surface than I'm used to with this patient. She is using some form of liquid collagen gel that Dr. Marla Roe provided a Kerlix cover with the patient's own pressure stockings. She tells me that she has extreme pain in both ankles and along the lateral aspect of both feet. She has been unable to work for some period of time. She is telling me she is retiring at the beginning of April. She sees Dr. Doran Durand of orthopedics next week 09/22/16; patient has not seen Dr. Marla Roe since the last time she is here. I'm not really sure what she is using to the wounds other than bits and pieces of think she had left over including most recently Hydrofera Blue. She is using juxtalite stockings. She is having difficulty with her husband's recent illness "stroke". She is having to transport him to various doctors appointments. Dr. Marla Roe left her the option of a repeat debridement with ACEL however she has not been able to get the time to follow-up on this. She continues to have a fair amount of drainage out of these wounds with certainly precludes leaving dressings on all week 10/13/16; patient has not seen Dr. Marla Roe since she was last in our clinic. I'm not really sure what she is doing with the wounds, we did try to get her [facility] and I think she is actually using this most of the time. Because of drainage she  states she has to change this every second day although this is an improvement from  what she used to do. She went to see Dr. Doran Durand who did not think she had a muscular issue with regards to her feet, he referred her to a neurologist and I think the appointment is sometime in June. I changed her back to Iodoflex which she has used in the past but not recently. 11/03/16; the patient has been using Iodoflex although she ran out of this. Still claims that there is a lot of drainage although the wound does not look like this. No surrounding erythema. She has not been back to see Dr. Marla Roe 11/24/16; the patient has been using Iodoflex again but she ran out of it 2 or 3 days ago. There is no major change in the condition of either one of these wounds in fact they are larger and covered in a thick adherent surface slough/nonviable tissue especially on the left. She does not tolerate mechanical debridement in our clinic. Going back to see Dr. Marla Roe of plastic surgery for an operative debridement would seem reasonable. 12/15/16; the patient has not been back to see Dr. Marla Roe. She is been dealing with a series of illnesses and her husband which of monopolized her time. She is been using Sorbact which we largely supplied. She states the drainage is bad enough that it maximum she can go 2-3 days without changing the dressing 01/12/2017 -- the patient has not been back for about 4 weeks and has not seen Dr. Marla Roe not does she have any appointment pending. 01/23/17; patient has not seen Dr. Marla Roe even though I suggested this previously. She is using Santyl that was suggested last week by Dr. Con Memos this Cost her $16 through her insurance which is indeed surprising 02/12/17; continuing Santyl and the patient is changing this daily. A lot of drainage. She has not been back to see plastic surgery she is using an Ace wrap. Our intake nurse suggested wrap around stockings which would make a good  reasonable alternative 02/26/17; patient is been using Santyl and changing this daily due to drainage. She has not been to see plastic surgery she uses in April Ace wrap to control the edema. She did obtain extremitease stockings but stated that the edema in her leg was to big for these 03/20/17; patient is using Santyl and Anasept. Surfaces looked better today the area on the right is actually measuring a little smaller. She has states she has a lot of pain in her feet and ankles and is asking for a consult to pain control which I'll try to help her with through our case manager. 04/10/17; the patient arrives with better-looking wound surfaces and is slightly smaller wound on the left she is using a combination of Santyl and Anasept. She has an appointment or at least as started in the pain control center associated with [facility] 05/14/17; this is a patient who I followed for a prolonged period of time. She has venous insufficiency ulcers on her bilateral medial ankles. At one point I had this down to a much smaller wound on the left however these reopened and we've never been able to get these to heal. She has been using Santyl and Anasept gel although 2 weeks ago she ran out of the Anasept gel. She has a stable appearance of the wound. She is going to the wound care clinic at [facility]. They wanted do a nerve block/spinal block although she tells me she is reluctant to go forward with that. 05/21/17; this is a patient I have followed  for many years. She has venous insufficiency ulcers on her bilateral medial ankles. Chronic pain and deformity in her ankles as well. She is been to see plastic surgery as well as orthopedics. Using PolyMem AG most recently/Kerramax/ABDs and 2 layer compression. She has managed to keep this on and she is coming in for a nurse check to change the dressing on Tuesdays, we see her on Fridays 06/05/17; really quite a good looking surface and the area especially  on the right medial has contracted in terms of dimensions. Well granulated healthy-looking tissue on both sides. Even with an open curet there is nothing that even feels abnormal here. This is as good as I've seen this in quite some time. We have been using PolyMem AG and bringing her in for a nurse check 06/12/17; really quite good surface on both of these wounds. The right medial has contracted a bit left is not. We've been using PolyMem and AG and she is coming in for a nurse visit 06/19/17; we have been using PolyMem AG and bringing her in for a nurse check. Dimensions of her wounds are not better but the surfaces looked better bilaterally. She complained of bleeding last night and the left wound and increasing pain bilaterally. She states her wound pain is more neuropathic than just the wounds. There was some suggestion that this was radicular from her pain management doctor in talking to her it is really difficult to sort this out. 06/26/17; using PolyMem and AG and bringing her in for a nurse check as All of this and reasonably stable condition. Certainly not improved. The dimensions on the lateral part of the right leg look better but not really measuring better. The medial aspect on the left is about the same. 07/03/16; we have been using PolyMen AG and bringing her in for a nurse check to change the dressings as the wounds have drainage which precludes once weekly changing. We are using all secondary absorptive dressings.our intake nurse is brought up the idea of using a wound VAC/snap VAC on the wound to help with the drainage to see if this would result in some contraction. This is not a bad idea. The area on the right medial is actually looking smaller. Both wounds have a reasonable-looking surface. There is no evidence of cellulitis. The edema is well controlled 07/10/17; the patient was denied for a snap VAC by her insurance. The major issue with these wounds continues to be drainage. We are  using wicked PolyMem AG and she is coming in for a nurse visit to change this. The wounds are stable to slightly improved. The surface looks vibrant and the area on the right certainly has shrunk in size but very slowly 07/17/17; the patient still has large wounds on her bilateral medial malleoli. Surface of both of these wounds looks better. The dimensions seem to come and go but no consistent improvement. There is no epithelialization. We do not have options for advanced treatment products due to insurance issues. They did not approve of the wound VAC to help control the drainage. More recently we've been using PolyMem and AG wicked to allow drainage through. We have been bringing her in for a nurse visit to change this. We do not have a lot of options for wound care products and the home again due to insurance issues 07/24/17; the patient's wound actually looks somewhat better today. No drainage measurements are smaller still healthy-looking surface. We used silver collagen under PolyMen started last week.  We have been bringing her in for a dressing change 07/31/17; patient's wound surface continued to look better and I think there is visible change in the dimensions of the wound on the right. Rims of epithelialization. We have been using silver collagen under PolyMen and bringing her in for a dressing change. There appears to be less drainage although she is still in need of the dressing change 08/07/17. Patient's wound surface continues to look better on both sides and the area on the right is definitely smaller. We have been using silver collagen and PolyMen. She feels that the drainage has been it has been better. I asked her about her vascular status. She went to see Dr. Aleda Grana at [facility] and had some form of ablation. I don't have much detail on this. I haven't my notes from 2016 that she was not a candidate for any further ablation but I don't have any more information on this. We had  referred her to vein and vascular I don't think she ever went. He does not have a history of PAD although I don't have any information on this either. We don't even have ABIs in our record 08/14/17; we've been using silver collagen and PolyMen cover. And putting the patient and compression. She we are bringing her in as a nurse visit to change this because ofarge amount of drainage. We didn't the ABIs in clinic today since they had been done in many moons 1.2 bilaterally. She has been to see vein and vascular however this was at [facility] and she had ablation although I really don't have any information on this all seemed biking get a report. She is also been operatively debrided by plastic surgery and had a cell placed probably 8-12 months ago. This didn't have a major effect. We've been making some gains with current dressings 08/19/17-She is here in follow-up evaluation for bilateral medial malleoli ulcers. She continues to tolerate debridement very poorly. We will continue with recently changed topical treatment; if no significant improvement may consider switching to Iodosorb/Iodoflex. 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 She has been using silver collagen and PolyMem. I believe she has been used and tried on Iodoflex before. 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 These have been very refractory over time. We have been using silver collagen and PolyMen. She is coming in once a week for a doctor's and once a week for nurse visits. We are actually making some progress 09/18/17; the patient's wounds are smaller especially on the right medial. She arrives today to upset to consider even washing these off with Anasept which I think is been part of the  reason this is been closing. We've been using collagen covered in PolyMen otherwise. It is noted that she has a small area of folliculitis on the right medial calf that. As we are wrapping her legs I'll give her a short course of doxycycline to make sure this doesn't amount to anything. She is a long list of complaints today including imbalance, shortness of breath on exertion, inversion of her left ankle. With regards to the latter complaints she is been to see orthopedics and they offered her a tendon release surgery I believe but wanted her wounds to be closed first. I have recommended she  go see her primary physician with regards to everything else. 09/25/17; patient's wounds are about the same size. We have made some progress bilaterally although not in recent weeks. She will not allow me T wash these wounds with Anasept even if she is doing her cell. Wheeze we've been using collagen covered in PolyMen. Last week she had a small area of folliculitis this is now opened into a small wound. She completed 5 days of trimethoprim sulfamethoxazole 10/02/17; unfortunately the area on her left medial ankle is worse with a larger wound area towards the Achilles. The patient complains of a lot of pain. She will not allow debridement although visually I don't think there is anything to debridement in any case. We have been using silver collagen and PolyMen for several months now. Initially we are making some progress although I'm not really seeing that today. We will move back to [facility]. His admittedly this is a bit of a repeat however I'm hoping that his situation is different now. The patient tells me she had her leg on the left give out on her yesterday this is process some pain. 10/09/17; the patient is seen twice a week largely because of drainage issues coming out of the chronic medial bimalleolar wounds that are chronic. Last week the dimensions of the one on the left looks a little larger I  changed her to [facility]. She comes in today with a history of terrible pain in the bilateral wound areas. She will not allow debridement. She will not even allow a tissue culture. There is no surrounding erythema no no evidence of cellulitis. We have been putting her Kerlix Coban man. She will not allow more aggressive compression as there was a suggestion to put her in 3 layer wraps. 10/16/17; large wounds on her bilateral medial malleoli. These are chronic. Not much change from last week. The surface looks have healthy but absolutely no epithelialization. A lot of pain little less so of drainage. She will not allow debridement or even washing these off in the vigorous fashion with Anasept. 10/23/17; large wounds on her bilateral malleoli which are chronic. Some improvement in terms of size perhaps on the right since last time I saw these. She states that after we increased the 3 layer compression there was some bleeding, when she came in for a nurse visit she did not want 3 layer compression put back on about our nurse managed to convince her. She has known chronic venous visit issues and I'm hoping to get her to tolerate the 3 layer compression. using Hydrofera Blue 10/30/17; absolutely no change in the condition of either wound although we've had some improvement in dimensions on the right.. Attempted to put her in 3 layer compression she didn't tolerated she is back in 2 layer compression. We've been using Hydrofera Blue We looked over her past records. She had venous reflux studies in November 2016. There was no evidence of deep venous reflux on the right. Superficial vein did not show the greater saphenous vein at think this is been previously ablated the small saphenous vein was within normal limits. The left deep venous system showed no DVT the vessels were positive for deep venous reflux in the posterior tibial veins at the ankle. The greater saphenous vein was surgically absent small  saphenous vein was within normal limits. She went to vein and vascular at [facility]. I believe she had an ablation on the left greater saphenous vein. I'll update her reflux studies  perhaps ever reviewed by vein and vascular. We've made absolutely no progress in these wounds. Will also try to read and TheraSkins through her insurance 11/06/17; W the patient apparently has a 2 week follow-up with vein and vascular I like him to review the whole issue with regards to her previous vascular workup by Dr. Aleda Grana. We've really made no progress on these wounds in many months. She arrives today with less viable looking surface on the left medial ankle wound. This was apparently looking about the same on Tuesday when she was here for nurse visit. 11/13/17; deep tissue culture I did last time of the left lower leg showed multiple organisms without any predominating. In particular no Staphylococcus or group A strep were isolated. We sent her for venous reflux studies. She's had a previous left greater saphenous vein stripping and I think sclerotherapy of the right greater saphenous vein. She didn't really look at the lesser saphenous vein this both wounds are on the medial aspect. She has reflux in the common femoral vein and popliteal vein and an accessory vein on the right and the common femoral vein and popliteal vein on the left. I'm going to have her go to see vein and vascular just the look over things and see if anything else beside aggressive compression is indicated here. We have not been able to make any progress on these wounds in spite of the fact that the surface of the wounds is never look too bad. 11/20/17; no major change in the condition of the wounds. Patient reports a large amount of drainage. She has a lot of complaints of pain although enlisting her today I wonder if some of this at least his neuropathic rather than secondary to her wounds. She has an appointment with vein and vascular  on 12/30/17. The refractory nature of these wounds in my mind at least need vein and vascular to look over the wounds the recent reflux studies we did and her history to see if anything further can be done here. I also note her gait is deteriorated quite a bit. 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 She has a bilateral Trendelenburg gait. I wonder if this is neuropathic or perhaps multilevel radicular. 11/27/17; her wounds actually looks slightly better. Healthy-looking granulation tissue a scant amount of epithelialization. [place] healthcare will not pay for [employer]. They will play for tri layer Oasis and Dermagraft. This is not a diabetic ulcer. We'll try for the tri layer Oasis. She still complains of some drainage. She has a vein and vascular appointment on 12/30/17 12/04/17; the wounds visually look quite good. Healthy-looking granulation with some degree of epithelialization. We are still waiting for response to our request for trial to try layer Oasis. Her appointment with vascular to review venous and arterial issues isn't sold the end of July 7/31. Not allow debridement or even vigorous cleansing of the wound surface. 12/18/17; slightly smaller especially on the right. 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 We've been using Hydrofera Blue. We still are looking into triple layer Oasis through her insurance 01/08/18 on evaluation today patient's wound actually appears to be showing signs of good improvement at this point in time. She has been tolerating the dressing changes without complication. Fortunately there does not appear to be any evidence of infection at this point in time. We have been utilizing silver nitrate which does seem to be of benefit for her which is also good news. Overall I'm very  happy with how things seem to be both regards appearance as well as measurement. Patient did see Dr. Bridgett Larsson for evaluation on 12/30/17. In his assessment he  felt that stripping would not likely add much more than chronic compression to the patient's healing process. His recommendation was to follow-up in three months with Dr. Doren Custard if she hasn't healed in order to consider referral back to you and see vascular where she previously was in a trial and was able to get her wound to heal. I'll be see what she feels she when you staying compression and he reiterated this as well. 01/13/18 on evaluation today patient appears to actually be doing very well in regard to her bilateral medial malleolus ulcers. She seems to have tolerated the chemical cauterization with silver nitrate last week she did have some pain through that evening but fortunately states that I'll be see since it seems to be doing better she is overall pleased with the progress. 01/21/18; really quite a remarkable improvement since I've last seen these wounds. We started using silver nitrate specially on the islands of hyper granulation which for some reason her around the wound circumference. This is really done quite nicely. Primary dressing Hydrofera Blue under 4 layer compression. She seems to be able to hold out without a nurse rewrap. Follow-up in 1 week 01/28/18; we've continued the hydrofera blue but continued with chemical cauterization to the wound area that we started about a month ago for irregular hyper granulation. She is made almost stunning improvement in the overall wound dimensions. I was not really expecting this degree of improvement in these chronic wounds 02/05/18; we continue with Hydrofera Bluebut of also continued the aggressive chemical cauterization with silver nitrate. We made nice progress with the right greater than left wound. 02/12/18. We continued with Hydrofera Blue after aggressive chemical cauterization with silver nitrate. We appear to be making nice progress with both wound areas 02/19/2018; we continue with [facility] after washing the wounds vigorously  with Anasept spray and chemical cauterization with silver nitrate. We are making excellent progress. The area on the right's just about closed 02/26/2018. The area on the left medial ankle had too much necrotic debris today. I used a #5 curette we are able to get most of the soft. I continued with the silver nitrate to the much smaller wound on the right medial ankle she had a new area on her right lower pretibial area which she says was due to a role in her compression 03/05/2018; both wound areas look healthy. Not much change in dimensions from last week. I continue to use silver nitrate and Hydrofera Blue. The patient saw Dr. Doren Custard of vein and vascular. He felt she had venous stasis ulcers. He felt based on her previous arteriogram she should have adequate circulation for healing. Also she has deep venous reflux but really no significant correctable superficial venous reflux at this time. He felt we should continue with conservative management including leg elevation and compression 04/02/2018; since we last saw this woman about a month ago she had a fall apparently suffered a pelvic fracture. I did not look up the x-ray. Nevertheless because of pain she literally was bedbound for 2 weeks and had home health coming out to change the dressing. Somewhat predictably this is resulted in considerable improvement in both wound areas. The right is just about closed on the medial malleolus and the left is about half the size. 04/16/2018; both her wounds continue to go down in  size. Using Hydrofera Blue. 05/07/18; both her wounds appeared to be improving especially on the right where it is almost closed. We are using Hydrofera Blue 05/14/2018; slightly worse this week with larger wounds. Surface on the left medial not quite as good. We have been using Hydrofera Blue 05/21/18; again the wounds are slightly larger. Left medial malleolus slightly larger with eschar around the circumference. We have been using  Hydrofera Blue undergoing a wraps for a prolonged period of time. This got a lot better when she was more recumbent due to a fall and a back injury. I change the primary dressing the silver alginate today. She did not tolerate a 4 layer compression previously although I may need to bring this up with her next time 05/28/2018; area on the left medial malleolus again is slightly larger with more drainage. Area on the right is roughly unchanged. She has a small area of folliculitis on the right medial just on the lower calf. This does not look ominous. 06/03/2018 left medial malleolus slightly smaller in a better looking surface. We used silver nitrate on this last time with silver alginate. The area on the right appears slightly smaller 1/10; left medial malleolus slightly smaller. Small open area on the right. We used silver nitrate and silver alginate as of 2 weeks ago. We continue with the wound and compression. These got a lot better when she was off her feet 1/17; right medial malleolus wound is smaller. The left may be slightly smaller. Both surfaces look somewhat better. 1/24; both wounds are slightly smaller. Using silver alginate under Unna boots 1/31; both wounds appear smaller in fact the area on the right medial is just about closed. Surface eschar. We have been using silver alginate under Unna boots. The patient is less active now spends let much less time on her feet and I think this is contributed to the general improvement in the wound condition 2/7; both wounds appear smaller. I was hopeful the right medial would be closed however there there is still the same small open area. Slight amount of surface eschar on the left the dimensions are smaller there is eschar but the wound edges appear to be free. We have been using silver alginate under Unna boot's 2/14; both wounds once again measure smaller. Circumferential eschar on the left medial. We have been using silver alginate under Unna  boots with gradual improvement 2/21; the area on the right medial malleolus has healed. The area on the left is smaller. We have been using silver alginate and Unna boots. We can discharge wrapping the right leg she has 20/30 stockings at home she will need to protect the scar tissue in this area 2/28; the area on the right medial malleolus remains closed the patient has a compression stocking. The area on the left is smaller. We have been using silver alginate and Unna boots. 3/6 the area on the right medial ankle remains closed. Good edema control noted she is using her own compression stocking. The area on the left medial ankle is smaller. We have been managing this with silver alginate and Unna boots which we will continue today. 3/13; the area on the right medial ankle remains closed and I'm declaring it healed today. When necessary the left is about the same still a healthy-looking surface but no major change and wound area. No evidence of infection and using silver alginate under unna and generally making considerable improvement 3/27 the area on the right medial ankle remains closed  the area on the left is about the same as last week. Certainly not any worse we have been using silver alginate under an Unna boot 4/3; the area on the right medial ankle remains closed per the patient. We did not look at this wound. The wound on the left medial ankle is about the same surface looks healthy we have been using silver alginate under an Unna boot 4/10; area on the right medial ankle remains closed per the patient. We did not look at this wound. The wound on the left medial ankle is slightly larger. The patient complains that the [facility] caused burning pain all week. She also told us that she was a lot more active this week. Changed her back to silver alginate 4/17; right medial ankle still closed per the patient. Left medial ankle is slightly larger. Using silver alginate. She did not  tolerate Hydrofera Blue on this area 4/24; right medial ankle remains closed we have not look at this. The left medial ankle continues to get larger today by about a centimeter. We have been using silver alginate under Unna boots. She complains about 4 layer compression as an alternative. She has been up on her feet working on her garden 5/8; right medial ankle remains closed we did not look at this. The left medial ankle has increased in size about 100%. We have been using silver alginate under Unna boots. She noted increased pain this week and was not surprised that the wound is deteriorated 5/15; no major change in SA however much less erythema ( one week of doxy ocellulitis). 5/22-66 year old female returns at 1 week to the clinic for left medial ankle wound for which we have been using silver alginate under 3 layer compression She was placed on DOXY at last visit - the wound is wider at this visit. She is in 3 layer compression 5/29; change to [facility] last week. I had given her empiric doxycycline 2 weeks ago for a week. She is in 3 layer compression. She complains of a lot of pain and drainage on presentation today. 6/5; using Hydrofera Blue. I gave her doxycycline recently empirically for erythema and pain around the wound. Believe her cultures showed enterococcus which not would not have been well covered by doxycycline nevertheless the wound looks better and I don't feel specifically that the enterococcus needs to be covered. She has a new what looks like a wrap injury on her lateral left ankle. 6/12; she is using Hydrofera Blue. She has a new area on the left anterior lower tibial area. This was a wrap injury last week. 6/19; the patient is using Hydrofera Blue. She arrived with marked inflammation and erythema around the wound and tenderness. 12/01/18 on evaluation today patient appears to be doing a little bit better based on what I'm hearing from the standpoint of lassos evaluation  to this as far as the overall appearance of the wound is concerned. Then sometime substandard she typically sees Dr. Dellia Nims. Nonetheless overall very pleased with the progress that she's made up to this point. No fevers, chills, nausea, or vomiting noted at this time. 7/10; some improvement in the surface area. Aggressively debrided last week apparently. I went ahead with the debridement today although the patient does not tolerate this very well. We have been using Iodoflex. Still a fair amount of drainage 7/17; slightly smaller. Using Iodoflex. 7/24; no change from last week in terms of surface area. We have been using Iodoflex. Surface looks and  continues to look somewhat better 7/31; surface area slightly smaller better looking surface. We have been using Iodoflex. This is under Unna boot compression 8/7-Patient presents at 1 week with Unna boot and Iodoflex, wound appears better 8/14-Patient presents at 1 week with Iodoflex, we use the Unna boot, wound appears to be stable better.Patient is getting Botox treatment for the inversion of the foot for tendon release, Next week 8/21; we are using Iodoflex. Unna boot. The wound is stable in terms of surface area. Under illumination there is some areas of the wound that appear to be either epithelialized or perhaps this is adherent slough at this point I was not really clear. It did not wipe off and I was reluctant to debride this today. 8/28; we are using Iodoflex in an Unna boot. Seems to be making good improvement. 9/4; using Iodoflex and wound is slightly smaller. 9/18; we are using Iodoflex with topical silver nitrate when she is here. The wound continues to be smaller 10/2; patient missed her appointment last week due to GI issues. She left and Iodoflex based dressing on for 2 weeks. Wound is about the same size about the size of a dime on the left medial lower 10/9 we have been using Iodoflex on the medial left ankle wound. She has a new  superficial probable wrap injury on the dorsal left ankle 10/16; we have been using Hydrofera Blue since last week. This is on the left medial ankle 10/23; we have been using Hydrofera Blue since 2 weeks ago. This is on the left medial ankle. Dimensions are better 11/6; using Hydrofera Blue. I think the wound is smaller but still not closed. Left medial ankle 11/13; we have been using Hydrofera Blue. Wound is certainly no smaller this week. Also the surface not as good. This is the remanent of a very large area on her left medial ankle. 11/20; using Sorbact since last week. Wound was about the same in terms of size although I was disappointed about the surface debris 12/11; 3-week follow-up. Patient was on vacation. Wound is measuring slightly larger we have been using Sorbact. 12/18; wound is about the same size however surface looks better last week after debridement. We have been using Sorbact under compression 1/15 wound is probably twice the size of last time increased in length nonviable surface. We have been using Sorbact. She was running a mild fever and missed her appointment last week 1/22; the wound is come down in size but under illumination still a very adherent debris we have been Hydrofera Blue that I changed her to last week 1/29; dimensions down slightly. We have been using Hydrofera Blue 2/19 dimensions are the same however there is rims of epithelialization under illumination. Therefore more the surface area may be epithelialized 2/26; the patient's wound actually measures smaller. The wound looks healthy. We have been using Hydrofera Blue. I had some thoughts about running Apligraf then I still may do that however this looks so much better this week we will delay that for now 3/5; the wound is small but about the same as last week. We have been using Hydrofera Blue. No debridement is required today. 3/19; the wound is about the size of a dime. Healthy looking wound even under  illumination. We have been using Hydrofera Blue. No mechanical debridement is necessary 3/26; not much change from last week although still looks very healthy. We have been using Hydrofera Blue under Unna boots Patient was offered an ankle fusion by podiatry  but not until the wound heals with a proceed with this. 4/9; the patient comes in today with her original wound on the medial ankle looking satisfactory however she has some uncontrolled swelling in the middle part of her leg with 2 new open areas superiorly just lateral to the tibia. I think this was probably a wrap issue. She said she felt uncomfortable during the week but did not call in. We have been using Hydrofera Blue 4/16; the wound on the medial ankle is about the same. She has innumerable small areas superior to this across her mid tibia. I think this is probably folliculitis. She is also been working in the yard doing a lot of sweating 4/30; the patient issue on the upper areas across her mid tibia of all healed. I think this was excessive yard work if I remember. Her wound on the medial ankle is smaller. Some debris on this we have been using Hydrofera Blue under Unna boots 5/7; mid tibia. She has been using Hydrofera Blue under an Unna wrap. She is apparently going for her ankle surgery on June 3 10/28/19-Patient returns to clinic with the ankle wound, we are using Hydrofera Blue under Unna wrap, surgery is scheduled for her left foot for June 3 so she will be back for nurse visit next week READMISSION 01/17/2020 Mrs. Waldren is a 66 year old woman we have had in this clinic for a long period of time with severe venous hypertension and refractory wounds on her medial lower legs and ankles bilaterally. 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 When she was off her legs for a prolonged period example when she fell  and suffered a compression fracture things would heal up quite nicely. She is now retired and we managed to heal up the right medial leg wound. The left one was very tiny last time I saw this although still refractory. 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 It got to the point where this was interfering with her walking and she elected to proceed with a ankle arthrodesis to straighten her her ankle and leave her with a functional outcome for mobilization. The patient was referred to Dr. Doren Custard and really this took some time to arrange. Dr. Doren Custard saw her on 12/07/2019. Once again he verified that she had no arterial issues. She had previously had an angiogram several years ago. Follow-up ABIs on the left showed an ABI of 1.12 with triphasic waveforms and a TBI of 0.92. She is felt to have chronic deep venous insufficiency but I do not think it was felt that anything could be done from about this from an ablation point of view. At the time Dr. Doren Custard saw this patient the wounds actually look closed via the pictures in his clinic. The patient finally underwent her surgery on 12/15/2019. This went reasonably well and there was a good anatomic outcome. She developed a small distal wound dehiscence on the lateral part of the surgical wound. However more problematically she is developed recurrence of the wound on the medial left ankle. There are actually 2 wounds here one in the distal lower leg and 1 pretty much at the level of the medial malleolus. It is a more distal area that is more problematic. She has been using Hydrofera Blue which  started on Friday before this she was simply Ace wrapping. There was a culture done that showed Pseudomonas and she is on ciprofloxacin. A recent CNS on 8/11 was negative. The patient reports some pain but I generally think this is improving. She is using a cam boot completely  nonweightbearing using a walker for pivot transfers and a wheelchair 8/24; not much improvement unfortunately she has a surgical wound on the lateral part in the venous insufficiency wound medially. The bottom part of the medial insufficiency wound is still necrotic there is exposed tendon here. We have been using Hydrofera Blue under compression. Her edema control is however better 8/31; patient in for follow-up of his surgical wound on the lateral part of her left leg and chronic venous insufficiency ulcers medially. We put her back in compression last week. She comes in today with a complaint of 3 or 4 days worth of increasing pain. She felt her cam walker was rubbing on the area on the back of her heel. However there is intense erythema seems more likely she has cellulitis. She had 2 cultures done when she was seeing podiatry in the postop. One of them in late July showed Pseudomonas and she received a course of ciprofloxacin the other was negative on 8/11 she is allergic to penicillin with anaphylactoid complaints of hives oral swelling via information in epic 9/9; when I saw this patient last week she had intense anterior erythema around her wound on the right lateral heel and ankle and also into the right medial heel. Some of this was no doubt drainage and her walker boot however I was convinced she had cellulitis. I gave her Levaquin and Bactrim she is finishing up on this now. She is following up with Dr. [name] he saw her yesterday. He is taken her out of the walking boot of course she is still nonweightbearing. Her x-ray was negative for any worrisome features such as soft tissue air etc. Things are a lot better this week. She has home health. We have been using Hydrofera Blue under an [employer] which she put back on yesterday. I did not wrap her last week 9/17; her surrounding skin looks a lot better. In fact the area on the left lateral ankle has just a scant amount of eschar. The only  remaining wound is the large area on the left medial ankle. Probably about 60% of this is healthy granulation at the surface however she has a significant divot distally. This has adherent debris in it. I been using debridement and silver collagen to try and get this area to fill-in although I do not think we have made much progress this week 9/24; the patient's wound on the left medial ankle looks a lot better. The deeper divot area distally still requires debridement but this is cleaning up quite nicely we have been using silver collagen. The patient is complaining of swelling in her foot and is worried that that is contributing to the nonhealing of the ankle wound. She is also complaining of numbness in her anterior toes 10/4; left medial ankle. The small area distally still has a divot with necrotic material that I have been debriding away. This has an undermining area. She is approved for Apligraf. She saw Dr. [name] her surgeon on 10/1. I think he declared himself is satisfied with the condition of things. Still nonweightbearing till the end of the month. 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 Her surgical wound is  well closed. There is no evidence of infection 10/11; the patient arrived in clinic today with the expectation that we be able to put an Apligraf on this area after debridement however she arrives with a relatively large amount of green drainage on the dressing. The patient states that this started on Friday. She has not been systemically unwell. 10/19; culture I did last week showed both Enterococcus and Pseudomonas. I think this came in separate parts because I stopped her ciprofloxacin I gave her and prescribed her linezolid however now looking at the final culture result this is Pseudomonas which is resistant to quinolones. She has not yet picked up the linezolid apparently phone issues. We are also trying to get a topical antibiotic out of  [place] in [place] they can be applied by home health. She is still having green drainage 10/16; the patient has her topical antibiotic from [facility] in [place]. This is a compounded gel with vancomycin and ciprofloxacin and gentamicin. We are applying this on the wound bed with silver alginate over the top with Unna boot wraps. She arrives in clinic today with a lot less ominous looking drainage although she is only use this topical preparation once the second time today. She sees Dr. [name] her surgeon on Friday she has home health changing the dressing 11/2; still using her compounded topical antibiotic under silver alginate. Surface is cleaning up there is less drainage. We had an Apligraf for her today and I elected to apply it. A light coating of her antibiotic 04/25/2020 upon evaluation today patient appears to be doing well with regard to her ankle ulcer. There is a little bit of slough noted on the surface of the wound I am can have to perform sharp debridement to clear this away today. With that being said other than that fact overall I feel like she is making progress and we do see some new epithelial growth. There is also some improvement in the depth of the wound and that distal portion. There is little bit of slough there as well. 12/7; 2-week follow-up. Apligraf #3. Dimensions are smaller. Closing in especially inferiorly. Still some surface debris. Still using the [facility] topical antibiotic but I told her that I don't think this needs to be renewed 12/21; 2-week follow-up. Apligraf #4 dimensions are smaller. Nice improvement 06/05/2020; 2-week follow-up. The patient's wound on the left medial ankle looks really excellent. Nice granulation. Advancing epithelialization no undermining no evidence of infection. We would have to reapply for another Apligraf but with the condition of this wound I did not feel strongly about it. We used Hydrofera Blue under the same degree  of compression. She follows up with Dr. [name] her surgeon a week Friday 06/13/2020 upon evaluation today patient appears to be doing excellent in regard to her wound. She has been tolerating the dressing changes without complication. Fortunately there is no signs of active infection at this time. No fevers, chills, nausea, vomiting, or diarrhea. She was using Hydrofera Blue last week. 06/20/2020 06/20/2020 on evaluation today patient actually appears to be doing excellent in regard to her wound. This is measuring better and looking much better as well. She has been using the collagen that seems to be doing better for her as well even though the [facility] was and is not sticking or feeling as rough on her wound. She did see Dr. [name] on Friday he is very pleased he also stated none of the hardware has shifted. That is great news  1/27; the patient has a small clean wound all that is remaining. I agree that this is too small to really consider an Apligraf. Under illumination the surface is looking quite good. We have been using collagen although the dimensions are not any better this week 2/2; the patient has a small clean wound on the left medial ankle. Although this left of her substantial original areas. Measurements are smaller. We have been using polymen Ag under an Haematologist. 2/10; small area on the left medial ankle. This looks clean nothing to debride however dimensions are about the same we have been using polymen I think now for 2 weeks 2/17; not much change in surface area. We have been using polymen Ag without any improvement. 3/17; 1 month follow-up. The patient has been using endoform without any improvement in fact I think this looks worse with more depth and more expansion 3/24; no improvement. Perhaps less debris on the surface. We have been using Sorbact for 1 week 4/4; wound measures larger. She has edema in her leg and her foot which she tells as her wrap came down. We have been  using Unna boots. Sorbact of the wound. She has been approved for Apligraf 09/12/2020 upon evaluation today patient appears to be doing well with regard to her wound. We did get the Apligraf reapproved which is great news we have that available for application today. Fortunately there is no signs of infection and overall the patient seems to be doing great. The wound bed is nice and clean. 4/27; patient presents for her second application of Apligraf. She states over the past week she has been on her feet more often due to being outside in her garden. She has noted more swelling to her foot as a result. She denies increased warmth, pain or erythema to the wound site. 10/10/2020 upon evaluation today patient appears to be doing well with regard to her wound which does not appear to be quite as irritated as last week from what I am hearing. With that being said unfortunately she is having issues with some erythema and warmth to touch as well as an increase size. I do believe this likely is infected. 10/17/2020 upon evaluation today patient appears to be doing excellent in regard to her wound this is significantly improved as compared to last week. Fortunately I think that the infection is much better controlled at this point. She did have evidence of both Enterococcus as well as Staphylococcus noted on culture. Enterococcus really would not be helped significantly by the Cipro but the wound is doing so much better I am under the assumption that the Staphylococcus is probably the main organism that is causing the current infection. Nonetheless I think that she is doing excellent as far as that is concerned and I am very pleased in that regard. I would therefore recommend she continue with the Cipro. 10/31/2020 upon evaluation today patient appears to be doing well with regard to her wound. She has been tolerating the dressing changes without complication. Fortunately there is no signs of active infection and  overall I am extremely pleased with where things stand today. No fevers, chills, nausea, vomiting, or diarrhea. With that being said she does have some green drainage coming from the wound and although it looks okay I am a little concerned about the possibility of a continuing infection. Specifically with Pseudomonas. For that reason I will go ahead and send in a prescription for Cipro for her to be continued. 11/14/2020  upon evaluation today patient appears to be doing very well currently in regard to her wound on her leg. She has been tolerating the dressing changes without complication. Fortunately I feel like the infection is finally under good control here. Unfortunately we do not have the Apligraf for application today although we can definitely order to have it in place for next week. That will be her fifth and final of the current series. Nonetheless I feel like her wound is really doing quite well which is great news. 11/21/2020 upon evaluation today patient appears to be doing well with regard to her wound on the medial ankle. Fortunately I think the infection is under control and I do believe we can go ahead and reapply the Apligraf today. She is in agreement with that plan. There does not appear to be any signs of active infection at this time which is great news. No fevers, chills, nausea, vomiting, or diarrhea. 12/05/2020 upon evaluation today patient's wound bed actually showed signs of good granulation epithelization at this point. There does not appear to be any signs of infection which is great news and overall very pleased with where things stand. Overall the patient seems to be doing fairly well in my opinion with regard to her wound although I do believe she continues to build up a lot of biofilm I think she could benefit from using PuraPly at this point. 12/12/2020 upon evaluation today patient's wound actually appears to be doing decently well today. The Unna boot has not been quite as  well-tolerated so that more uncomfortable for her and even causing some pressure over the plate on the lateral portion of her foot which is 90 where the wound is. There did not appear to be any significant deep tissue injury with that there may be a minimal change in the skin noted I think that we may want to go back to the Coflex 2 layer which is a little bit easier on her skin it seems. 12/19/2020 upon evaluation today patient actually seems to be making great progress with the PuraPly currently. She in fact seems to be much better as far as the overall appearance of the wound bed is concerned I am very happy in this regard. I do not see any signs of of infection which is great news as well. No fevers, chills, nausea, vomiting, or diarrhea. 12/26/2020 upon evaluation today patient appears to actually be doing better in regard to her wound on the left medial ankle region. The surface of the wound is actually doing significantly better which is great news. There does not appear to be any signs of infection which is also great news and in general I am extremely pleased with where we stand today. 01/02/2021 upon evaluation today patient appears to be doing well with regard to her wound. In fact this is showing signs of excellent improvement and very pleased with where things stand. In fact the last 3 appointments have all shown signs of this getting smaller which is excellent news. I have not even had to perform any debridement and today is no exception. Overall I feel like this is dramatically improved compared to previous. T oday is PuraPly application #4. 2/54/2706 upon evaluation today patient appears to be doing excellent in regard to her wound this is continue to show signs of improvement and overall I am extremely pleased with where we stand today. She is actually here for PuraPly application #5. Every time we have applied this she is noted  definite improvement on measurements. 01/23/2021 upon  evaluation today patient is actually making good progress in regard to her wound. This was actually on just a little bit longer this time compared to previous due to the fact that she did have to go out of town. She is actually here for PuraPly application #6. We have definitely been seeing improvements in the overall quality of the tissue on the surface of the wound which is awesome news. In general I think that the patient seems to be continuing to make great progress here. 01/30/2021 upon evaluation today patient's wound is actually doing excellent. There is really not any significant biofilm buildup which is great news and overall I am extremely pleased with where things stand today. There does not appear to be any signs of active infection. No fevers, chills, nausea, vomiting, or diarrhea. 02/06/2021 upon evaluation today patient's wound is actually showing signs of excellent improvement which is great news. We continue to see the benefit of the PuraPly this is doing a great job the wound seems not really irritated whatsoever and is showing signs of good granulation at this point. Overall I am extremely happy with what we are seeing. The patient likewise is happy to hear all of this as well. 02/13/2021 upon evaluation today patient appears to be doing well with regard to her wound. This again is measuring smaller today and I am very pleased in this regard. Fortunately there does not appear to be any signs of active infection at this time which is good news from a systemic standpoint. Locally there is still some significant drainage which she does have is concerned about infection locally. No fevers, chills, nausea, vomiting, or diarrhea. 02/20/2021 upon evaluation today patient actually appears to be doing decently well in regard to her wound. She has been tolerating the dressing changes without complication. I do believe the PuraPly is helping wound bed does appear to be doing better. There is no  evidence of active infection locally or systemically at this point visually although on fluorescence imaging there still appear to be bacterial load and bioburden noted. 02/27/2021 upon evaluation today patient fortunately continues to show signs of improvement with use of the PuraPly currently. Subsequently we did review her culture results and to be honest I think that the Bactrim is probably the best option to have her continue at this point. For that reason I am get a go ahead and send in a refill today for her for an additional 2 weeks. Nonetheless I think that we are making excellent progress here. It was Enterococcus and Staphylococcus that were noted she is allergic to penicillin so there is not much I can do from the Enterococcus standpoint though the staff does seem to be sensitive to the Bactrim. 03/06/2021 upon evaluation today patient appears to be doing well with regard to her wound. This is definitely showing signs of improvement which is great news and overall I am extremely pleased with where things stand today. There does not appear to be any signs of active infection which is great news and overall and I do believe that we are headed in the appropriate direction I think the PuraPly is doing an awesome job for her. 03/20/2021 upon evaluation today patient actually appears to be doing well with regard to her wound. We have been using the PuraPly although last week when I was out of the office they actually just used endoform nonetheless this still seems to be doing great. Fortunately there does  not appear to be any signs of active infection which is great news overall I think the patient is making wonderful progress. 03/27/2021 upon evaluation today patient appears to be doing well with regard to her wound in fact there is a little drainage but otherwise her does not appear to be any major signs of open wounds nor infection at this time. Overall I am extremely pleased with where things  stand. No fevers, chills, nausea, vomiting, or diarrhea. 04/03/2021 upon evaluation today patient presents for reevaluation here in the clinic and overall she seems to be doing quite well. Fortunately there does not appear to be any signs of active infection which is great news and in general I am very pleased with how things appear. There is still a very small opening remaining but in general she is looking much better. Fortunately I do not think there is any need for the PuraPly at this point. 04/10/2021 upon evaluation today patient's wound is actually showing signs of excellent improvement. I am actually very pleased with where it stands today. I think she is very close to complete closure in fact this may be healed today. Either way I think that she may benefit from using her own compression socks for the next week and then will subsequently see how things stand following. 04/17/2021 upon evaluation today patient appears to be doing well with regard to her wound on the ankle region. This is actually showing signs of excellent improvement which is great news. I do not see any signs of active infection at this time which is great and no open wound. With that being said on the lateral portion of her ankle right at the base of her plate she actually has an area of pressure that occurred over the past week when she was not in the wrap or we been padding her. This does seem to be an issue which I think is good to be an ongoing problem I am can actually get in touch with Dr. [name] today to see what we can do in that regard. 04/24/2021 upon evaluation today patient appears to be doing well with regard to her ankle region for the most part although there does appear to be some fluid trapped underneath the area where we thought this was healed last week. Unfortunately I think at this time that we can have to reopen and clean away some of this area and then subsequently depending on how things do over the  next week or so we will see about discharge but right now I do not think that is the right way to go. She voiced understanding. 05/01/2021 upon evaluation today patient's wound still appears to be open unfortunately. I do believe that we do need to see what we can do about trying to get this closed I believe compression wrapping is probably can to be indicated here. Fortunately there is no signs of active infection at this time. 05/08/2021 upon evaluation today patient appears to be doing okay in regard to her ulcer this is very superficial and we will see any major issues here. The biggest problem currently that I find is that we just cannot get this area to completely seal up. She seemed to almost be and then things digressed a little bit. Right now is not too bad but we do need to see if we can make some difference here. I think maybe switching over to collagen may be better. 05/15/2021 upon evaluation today patient actually appears to be  doing pretty well in regard to her wound. This is measuring a little smaller and looking much better. She did see orthopedics today there to me making her a insert for her shoe to try to help straighten out her gait. I think this can be beneficial for her as well. 05/22/2021 upon evaluation today patient appears to be doing well currently in regard to her wound although is not significantly smaller unfortunately this at least does not show any signs of active infection at this time which is great news. No fevers, chills, nausea, vomiting, or diarrhea. I am going to see about switching over to endoform that is what we wanted to use in the past always been on backorder. 06/05/2021 upon evaluation today patient appears to be doing somewhat poorly in regard to her wound. This is really not showing the signs of improvement that I would have like to have seen. Fortunately I do not see any signs of active infection locally nor systemically which is great news. With that  being said even though I do not see the sign she is having some increased discomfort which is not good. She is also having some issues here with the wound looking a little larger today compared to where its been. With that being said overall I believe that we probably need to consider at least that there may be a subacute infection here and that is what is going on. We have tried multiple different medications and dressings which have done well for her in the past without seeing any signs of improvement. She is in agreement with addressing this as such. 06/12/2020 upon evaluation today patient's wound is actually showing some signs of improvement today. In fact I do not even see any need for sharp debridement based on what I see at this point today. Fortunately there is no evidence of active infection currently which is great news. No fevers, chills, nausea, vomiting, or diarrhea. 06/26/2021 upon evaluation today patient appears to be doing well with regard to her wounds. Fortunately there does not appear to be any signs of active infection at this time which is great news. I do believe the Bactrim is doing well for her. Fortunately there does not appear to be any signs of active infection at this time. With the hardware in place and everything else I do believe extending this for 2 additional weeks for 4 weeks total would be advisable. 07/03/2021 upon evaluation today patient appears to be doing well with regard to her wound. She has been tolerating the dressing changes which is great news. Fortunately there does not appear to be any evidence of active infection locally nor systemically at this time. 07/10/2021 upon evaluation today patient appears to be doing well with regard to her wound I do feel that the PuraPly has done an awesome job for her over the past week. Fortunately I do not see any signs of active infection at this time. No fevers, chills, nausea, vomiting, or diarrhea. 2/15; the patient's  wound is an inverted cone. Most of the walls of the colon are epithelialized however the tip of the conus is still open. I gather this was considered to be quite an improvement.We reapplied Puraply in the standard fashion 07/24/2021 upon evaluation patient appears to be doing well with regard to her wound. She has been tolerating the dressing changes without complication which is great news. There is no signs of active infection at this time. Electronic Signature(s) Signed: 07/24/2021 11:57:43 AM By: Melburn Hake,  Margarita Grizzle PA-C Entered By: [name] on 07/24/2021 11:57:42 -------------------------------------------------------------------------------- Physical Exam Details Patient Name: Date of Service: [facility] [name], [name] 07/24/2021 10:00 A M Medical Record Number: [id number] Patient Account Number: [id number] Date of Birth/Sex: Treating RN: 10/14/1955 (66 y.o. [name] Provider: [name] Other Clinician: Referring Provider: Treating Provider/Extender: [name] in Treatment: 39 Constitutional Well-nourished and well-hydrated in no acute distress. Respiratory normal breathing without difficulty. Psychiatric this patient is able to make decisions and demonstrates good insight into disease process. Alert and Oriented x 3. pleasant and cooperative. Notes Patient's wound bed was actually covered by some drainage and dry dressing as well as a little bit of callus buildup. I did perform debridement to clear this away. Postdebridement this appears to be doing significantly better which is great news. Overall I think that we are headed in the right direction. Electronic Signature(s) Signed: 07/24/2021 11:58:02 AM By: [name] PA-C Entered By: [name] on 07/24/2021 11:58:02 -------------------------------------------------------------------------------- Physician Orders Details Patient Name: Date of Service: [facility] [name], ELEA NO R  G. 07/24/2021 10:00 A M Medical Record Number: [id number] Patient Account Number: [id number] Date of Birth/Sex: Treating RN: [birth year] (66 y.o. 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 Margarita Grizzle Wednesday Room 9 Return A Cellular or Tissue Based Products Cellular or Tissue Based Product Type: - Puraply AM #2 07/10/2021 Puraply AM for #3 07/17/2021 Puraply AM for #4 07/24/2021 Bathing/ Shower/ Hygiene May shower with protection but do not get wound dressing(s) wet. Edema Control - Lymphedema / SCD / Other Bilateral Lower Extremities Elevate legs to the level of the heart or above for 30 minutes daily and/or when sitting, a frequency of: - throughout the day when sitting Avoid standing for long periods of time. Patient to wear own compression stockings every day. Exercise regularly Moisturize legs daily. Non Wound Condition pply the following to affected area as directed: - Apply silicone bordered foam to left lateral foot for protection A Wound Treatment Wound #15R - Malleolus Wound Laterality: Left, Medial Cleanser: Wound Cleanser (Generic) 1 x Per Day/15 Days Discharge Instructions: Cleanse the wound with wound cleanser prior to applying a clean dressing using gauze sponges, not tissue or cotton balls. Peri-Wound Care: Sween Lotion (Moisturizing lotion) 1 x Per Day/15 Days Discharge Instructions: Apply moisturizing lotion as directed Prim Dressing: KerraCel Ag  Gelling Fiber Dressing, 2x2 in (silver alginate) 1 x Per Day/15 Days ary Discharge Instructions: Apply silver alginate to wound bed as instructed Secondary Dressing: Woven Gauze Sponge, Non-Sterile 4x4 in (Generic) 1 x Per Day/15 Days Discharge Instructions: Apply over primary dressing as directed. Secondary Dressing: ABD Pad, 8x10 1 x Per Day/15 Days Discharge Instructions: Apply over primary dressing as directed. Compression Wrap: CoFlex TLC XL 2-layer Compression System 4x7 (in/yd) 1 x Per Day/15 Days Discharge Instructions: Apply CoFlex 2-layer compression as directed. (alt for 4 layer) Electronic Signature(s) Signed: 07/24/2021 4:57:32 PM By: [name] PA-C Signed: 07/24/2021 5:17:37 PM By: Baruch Gouty RN, BSN Entered By: Baruch Gouty on 07/24/2021 10:45:23 --------------------------------------------------------------------------------  Problem List Details Patient Name: Date of Service: [facility] [name], [name] 07/24/2021 10:00 A M Medical Record Number: [id number] Patient Account Number: [id number] Date of Birth/Sex: Treating RN: 12/08/1955 (66 y.o. [name] Provider: [name] Other Clinician: Referring Provider: Treating Provider/Extender: [name] in Treatment: 40 Active Problems ICD-10 Encounter Code Description Active Date MDM Diagnosis I87.332 Chronic venous hypertension (idiopathic) with ulcer and inflammation of left 01/17/2020 No Yes lower extremity L97.828 Non-pressure chronic ulcer of other part of left lower leg with other specified 01/17/2020 No Yes severity L97.328 Non-pressure chronic ulcer of left ankle with other specified severity 01/17/2020 No Yes Inactive Problems ICD-10 Code Description Active Date Inactive Date L03.116 Cellulitis of left lower limb 01/31/2020 01/31/2020 T81.31XD Disruption of external operation (surgical) wound, not elsewhere classified, subsequent 01/17/2020  01/17/2020 encounter Resolved Problems Electronic Signature(s) Signed: 07/24/2021 10:16:06 AM By: [name] PA-C Entered By: [name] on 07/24/2021 10:16:06 -------------------------------------------------------------------------------- Progress Note Details Patient Name: Date of Service: [facility] [name], [name] 07/24/2021 10:00 A M Medical Record Number: [id number] Patient Account Number: [id number] Date of Birth/Sex: Treating RN: [birth year] (66 y.o. [name] Provider: [name] Other Clinician: Referring Provider: Treating Provider/Extender: [name] in Treatment: 75 Subjective Chief Complaint Information obtained from Patient patient is been followed long-term in this clinic for venous insufficiency ulcers with inflammation, hypertension and ulceration over the medial ankle bilaterally. 01/17/2020; this is a patient who is here for review of postoperative wounds on the left lateral ankle and recurrence of venous stasis ulceration on the left medial History of Present Illness (HPI) the remaining wound is over the left medial ankle. Similar wound over the right medial ankle healed largely with use of Apligraf. Most recently we have been using Hydrofera Blue over this wound with considerable improvement. The patient has been extensively worked up in the past for her venous insufficiency and she is not a candidate for antireflux surgery although I have none of the details available currently. 08/24/14; considerable improvement today. About 50% of this wound areas now epithelialized. The base of the wound appears to be healthier granulation.as opposed to last week when she had deteriorated a considerable improvement 08/17/14; unfortunately the wound has regressed somewhat. The areas of epithelialization from the superior aspect are not nearly as healthy as they were last week. The patient thinks her Hydrofera Blue  slipped. 09/07/14; unfortunately the area has markedly regressed in the 2 weeks since I've seen this. There is an odor surrounding erythema. The healthy granulation tissue that we had at the base of the wound now is a dusky color. The nurse reports green drainage 09/14/14; the area looks somewhat better than last week. There is less erythema and less drainage. The culture I did did not show any growth. Nevertheless I think it is better to continue the Cipro and doxycycline for a further week. The remaining wound area was debridement. 09/21/14. Wound did not require debridement last week. Still less erythema and less drainage. She can complete her antibiotics. The areas of epithelialization in the superior aspect of the wound do not look as healthy as they did some weeks ago 10/05/14 continued improvement in the condition of this wound. There is advancing epithelialization. Less aggressive debridement required 10/19/14 continued improvement in the condition and volume of this wound. Less aggressive debridement to the inferior part of this to remove surface slough and fibrinous eschar 11/02/14 no  debridement is required. The surface granulation appears healthy although some of her islands of epithelialization seem to have regressed. No evidence of infection 11/16/14; lites surface debridement done of surface eschar. The wound does not look to be unhealthy. No evidence of infection. Unfortunately the patient has had podiatry issues in the right foot and for some reason has redeveloped small surface ulcerations in the medial right ankle. Her original presentation involved wounds in this area 11/23/14 no debridement. The area on the right ankle has enlarged. The left ankle wound appears stable in terms of the surface although there is periwound inflammation. There has been regression in the amount of new skin 11/30/14 no debridement. Both wound areas appear healthy. There was no evidence of infection. The the new  area on the right medial ankle has enlarged although that both the surfaces appear to be stable. 12/07/14; Debridement of the right medial ankle wound. No no debridement was done on the left. 12/14/14 no major change in and now bilateral medial ankle wounds. Both of these are very painful but the no overt evidence of infection. She has had previous venous ablation 12/21/14; patient states that her right medial ankle wound is considerably more painful last week than usual. Her left is also somewhat painful. She could not tolerate debridement. The right medial ankle wound has fibrinous surface eschar 12/28/14 this is a patient with severe bilateral venous insufficiency ulcers. For a considerable period of time we actually had the one on the right medial ankle healed however this recently opened up again in June. The left medial ankle wound has been a refractory area with some absent flows. We had some success with Hydrofera Blue on this area and it literally closed by 50% however it is recently opened up Foley. Both of these were debridement today of surface eschar. She tolerates this poorly 01/25/15: No change in the status of this. Thick adherent escar. Very poor tolerance of any attempt at debridement. I had healed the right medial malleolus wound for a considerable amount of time and had the left one down to about 50% of the volume although this is totally regressed over the last 48 weeks. Further the right leg has reopened. she is trying to make a appointment with pain and vascular, previous ablations with Dr. Aleda Grana. I do not believe there is an arterial insufficiency issue here 02/01/15 the status of the adherent eschar bilaterally is actually improved. No debridement was done. She did not manage to get vascular studies done 02/08/15 continued debridement of the area was done today. The slough is less adherent and comes off with less pressure. There is no surrounding infection peripheral pulses are  intact 02/15/15 selective debridement with a disposable curette. Again the slough is less adherent and comes off with less difficulty. No surrounding infection peripheral pulses are intact. 02/22/15 selective debridement of the right medial ankle wound. Slough comes off with less difficulty. No obvious surrounding infection peripheral pulses are intact I did not debridement the one on the left. Both of these are stable to improved 03/01/15 selective debridement of both wound areas using a curette to. Adherent slough cup soft with less difficulty. No obvious surrounding infection. The patient tells me that 2 days ago she noted a rash above the right leg wrap. She did not have this on her lower legs when she change this over she arrives with widespread left greater than right almost folliculitis-looking rash which is extremely pruritic. I don't see anything to culture here. There  is no rash on the rest of her body. She feels well systemically. 03/08/15; selective debridement of both wounds using a curette. Base of this does not look unhealthy. She had limegreen drainage coming out of the left leg wound and describes a lot of drainage. The rash on her left leg looks improved to. No cultures were done. 03/22/15; patient was not here last week. Basal wounds does not look healthy and there is no surrounding erythema. No drainage. There is still a rash on the left leg that almost looks vasculitic however it is clearly limited to the top of where the wrap would be. 04/05/15; on the right required a surgical debridement of surface eschar and necrotic subcutaneous tissue. I did not debridement the area on the left. These continue to be large open wounds that are not changing that much. We were successful at one point in healing the area on the right, and at the same time the area on the left was roughly half the size of current measurements. I think a lot of the deterioration has to do with the prolonged time the  patient is on her feet at work 04/19/15 I attempted-like surface debridement bilaterally she does not tolerate this. She tells me that she was in allergic care yesterday with extreme pain over her left lateral malleolus/ankle and was told that she has an "sprain" 05/03/15; large bilateral venous insufficiency wounds over the medial malleolus/medial aspect of her ankles. She complains of copious amounts of drainage and his usual large amounts of pain. There is some increasing erythema around the wound on the right extending into the medial aspect of her foot to. historically she came in with these wounds the right one healed and the left one came down to roughly half its current size however the right one is reopened and the left is expanded. 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 05/10/15 large bilateral venous insufficiency wounds. There is less adherence surface left however the surface culture that I did last week grew pseudomonas therefore bilateral selective debridement score necessary. There is surrounding erythema. The patient describes severe bilateral drainage and a lot of pain in the left ankle. Apparently her podiatrist was were ready to do a cortisone shot 05/17/15; the patient complains of pain and again copious amounts of drainage. 05/24/15; we used Iodo flex last week. Patient notes considerable improvement in wound drainage. Only needed to change this once. 05/31/15; we continued Iodoflex; the base of these large wounds bilaterally is not too bad but there is probably likely a significant bioburden here. I would like to debridement just doesn't tolerate it. 06/06/14 I would like to continue the Iodoflex although she still hasn't managed to obtain supplies. She has bilateral medial malleoli or large wounds which are mostly superficial. Both of them are covered circumferentially with some nonviable fibrinous slough although she  tolerates debridement very poorly. She apparently has an appointment for an ablation on the right leg by interventional radiology. 06/14/15; the patient arrives with the wounds and static condition. We attempted a debridement although she does not do well with this secondary to pain. I 07/05/15; wounds are not much smaller however there appears to be a cleaner granulating base. The left has tight fibrinous slough greater than the right. Debridement is tolerated poorly due to pain. Iodoflex is done more for these wounds in any of the multitude of different dressings I have tried on  the left 1 and then subsequently the right. 07/12/15; no change in the condition of this wound. I am able to do an aggressive debridement on the right but not the left. She simply cannot tolerate it. We have been using Iodoflex which helps somewhat. It is worthwhile remembering that at one point we healed the right medial ankle wound and the left was about 25% of the current circumference. We have suggested returning to vascular surgery for review of possible further ablations for one reason or another she has not been able to do this. 07/26/15 no major change in the condition of either wound on her medial ankle. I did not attempt to debridement of these. She has been aggressively scrubbing these while she is in the shower at home. She has her supply of Iodoflex which seems to have done more for these wounds then anything I have put on recently. 08/09/15 wound area appears larger although not verified by measurements. Using Iodoflex 09/05/2015 -- she was here for avisit today but had significant problems with the wound and I was asked to see her for a physician opinion. I have summarize that this lady has had surgery on her left lower extremity about 10 years ago where the possible veins stripping was done. She has had an opinion from interventional radiology around November 2016 where no further sclerotherapy was ordered. The  patient works 12 hours a day and stands on a concrete floor with work boots and is unable to get the proper compression she requires and cannot elevate her limbs appropriately at any given time. She has recently grown Pseudomonas from her wound culture but has not started her ciprofloxacin which was called in for her. 09/13/15 this continues to be a difficult situation for this patient. At one point I had this wound down to a 1.5 x 1.5" wound on her left leg. This is deteriorated and the right leg has reopened. She now has substantial wounds on her medial calcaneus, malleoli and into her lower leg. One on the left has surface eschar but these are far too painful for me to debridement here. She has a vascular surgery appointment next week to see if anything can be done to help here. I think she has had previous ablations several years ago at [facility]. She has no major edema. She tells me that she did not get product last time [facility] Ag] and went for several days without it. She continues to work in work boots 12 hours a day. She cannot get compression/4-layer under her work boots. 09/20/15 no major change. Periwound edema control was not very good. Her point with pain and vascular is next Wednesday the 25th 09/28/15; the patient is seen vascular surgery and is apparently scheduled for repeat duplex ultrasounds of her bilateral lower legs next week. 10/05/15; the patient was seen by Dr. Doren Custard of vascular surgery. He feels that she should have arterial insufficiency excluded as cause/contributed to her nonhealing stage she is therefore booked for an arteriogram. She has apparently monophasic signals in the dorsalis pedis pulses. She also of course has known severe chronic venous insufficiency with previous procedures as noted previously. I had another long discussion with the patient today about her continuing to work 12 hour shifts. I've written her out for 2 months area had concerns about this as her  work location is currently undergoing significant turmoil and this may lead to her termination. She is aware of this however I agree with her that she simply  cannot continue to stand for 12 hours multiple days a week with the substantial wound areas she has. 10/19/15; the Dr. Doren Custard appointment was largely for an arteriogram which was normal. She does not have an arterial issue. He didn't make a comment about her chronic venous insufficiency for which she has had previous ablations. Presumably it was not felt that anything additional could be done. The patient is now out of work as I prescribed 2 weeks ago. Her wounds look somewhat less aggravated presumably because of this. I felt I would give debridement another try today 10/25/15; no major change in this patient's wounds. We are struggling to get her product that she can afford into her own home through her insurance. 11/01/15; no major change in the patient's wounds. I have been using silver alginate as the most affordable product. I spoke to Dr. Marla Roe last week with her requested take her to the OR for surgical debridement and placement of ACEL. Dr. Marla Roe told me that she would be willing to do this however [facility] will not cover this, fortunately the patient has [place] healthcare of some variant 11/08/15; no major change in the patient's wounds. She has been completely nonviable surface that this but is in too much pain with any attempted debridement are clinic. I have arranged for her to see Dr. Marla Roe ham of plastic surgery and this appointment is on Monday. I am hopeful that they will take her to the OR for debridement, possible ACEL ultimately possible skin graft 11/22/15 no major change in the patient's wounds over her bilateral medial calcaneus medial malleolus into the lower legs. Surface on these does not look too bad however on the left there is surrounding erythema and tenderness. This may be cellulitis or could him  sleepy tinea. 11/29/15; no major changes in the patient's wounds over her bilateral medial malleolus. There is no infection here and I don't think any additional antibiotics are necessary. There is now plan to move forward. She sees Dr. Marla Roe in a week's time for preparation for operative debridement and ACEL placement I believe on 7/12. She then has a follow-up appointment with Dr. Marla Roe on 7/21 12/28/15; the patient returns today having been taken to the [facility] by Dr. Marla Roe 12/12/15 she underwent debridement, intraoperative cultures [which were negative]. She had placement of a wound VAC. Parent really ACEL was not available to be placed. The wound VAC foam apparently adhered to the wound since then she's been using silver alginate, Xeroform under Ace wraps. She still says there is a lot of drainage and a lot of pain 01/31/16; this is a patient I see monthly. I had referred her to Dr. Marla Roe him of plastic surgery for large wounds on her bilateral medial ankles. She has been to the OR twice once in early July and once in early August. She tells me over the last 3 weeks she has been using the wound VAC with ACEL underneath it. On the right we've simply been using silver alginate. Under Kerlix Coban wraps. 02/28/16; this is a patient I'm currently seeing monthly. She is gone on to have a skin graft over her large venous insufficiency ulcer on the left medial ankle. This was done by Dr. Marla Roe him. The patient is a bit perturbed about why she didn't have one on her right medial ankle wound. She has been using silver alginate to this. 03/06/16; I received a phone call from her plastic surgery Dr. Marla Roe. She expressed some concern about  the viability of the skin graft she did on the left medial ankle wound. Asked me to place Endoform on this. She told me she is not planning to do a subsequent skin graft on the right as the left one did not take very well. I had placed Hydrofera Blue on  the right 03/13/16; continue to have a reasonably healthy wound on the right medial ankle. Down to 3 mm in terms of size. There is epithelialization here. The area on the left medial ankle is her skin graft site. I suppose the last week this looks somewhat better. She has an open area inferiorly however in the center there appears to be some viable tissue. There is a lot of surface callus and eschar that will eventually need to come off however none of this looked to be infected. Patient states that the is able to keep the dressing on for several days which is an improvement. 03/20/16 no major change in the circumference of either wound however on the left side the patient was at Dr. Eusebio Friendly office and they did a debridement of left wound. 50% of the wound seems to be epithelialized. I been using Endoform on the left Hydrofera Blue in the right 03/27/16; she arrives today with her wound is not looking as healthy as they did last week. The area on the right clearly has an adherent surface to this a very similar surface on the left. Unfortunately for this patient this is all too familiar problem. Clearly the Endoform is not working and will need to change that today that has some potential to help this surface. She does not tolerate debridement in this clinic very well. She is changing the dressing wants 04/03/16; patient arrives with the wounds looking somewhat better especially on the right. Dr. Migdalia Dk change the dressing to silver alginate when she saw her on Monday and also sold her some compression socks. The usefulness of the latter is really not clear and woman with severely draining wounds. 04/10/16; the patient is doing a bit of an experiment wearing the compression stockings that Dr. Migdalia Dk provided her to her left leg and the out of legs based dressings that we provided to the right. 05/01/16; the patient is continuing to wear compression stockings Dr. Migdalia Dk provided her on the left that  are apparently silver impregnated. She has been using Iodoflex to the right leg wound. Still a moderate amount of drainage, when she leaves here the wraps only last for 4 days. She has to change the stocking on the left leg every night 05/15/16; she is now using compression stockings bilaterally provided by Dr. Marla Roe. She is wearing a nonadherent layer over the wounds so really I don't think there is anything specific being done to this now. She has some reduction on the left wound. The right is stable. I think all healing here is being done without a specific dressing 06/09/16; patient arrives here today with not much change in the wound certainly in diameter to large circular wounds over the medial aspect of her ankle bilaterally. Under the light of these services are certainly not viable for healing. There is no evidence of surrounding infection. She is wearing compression stockings with some sort of silver impregnation as prescribed by Dr. Marla Roe. She has a follow-up with her tomorrow. 06/30/16; no major change in the size or condition of her wounds. These are still probably covered with a nonviable surface. She is using only her purchase stockings. She did see Dr. Marla Roe  who seemed to want to apply Dakin's solution to this I'm not extreme short what value this would be. I would suggest Iodoflex which she still has at home. 07/28/16; I follow Mrs. Liska episodically along with Dr. Marla Roe. 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 She has been applying a topical collagen ointment to both wounds with Adaptic. I don't think Dr. Marla Roe is planning to take her back to the OR. 08/19/16; I follow Mrs. Jeneen Rinks on a monthly basis along with Dr. Marla Roe of plastic surgery. 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 I been following her for a number of years.  At one point I was able to get the right medial malleolus wound to heal and had the left medial malleolus down to about half its current size however and I had to send her to plastic surgery for an operative debridement. Since then things have been stable to slightly improve the area on the right is slightly better one in the left about the same although there is much less adherent surface than I'm used to with this patient. She is using some form of liquid collagen gel that Dr. Marla Roe provided a Kerlix cover with the patient's own pressure stockings. She tells me that she has extreme pain in both ankles and along the lateral aspect of both feet. She has been unable to work for some period of time. She is telling me she is retiring at the beginning of April. She sees Dr. Doran Durand of orthopedics next week 09/22/16; patient has not seen Dr. Marla Roe since the last time she is here. I'm not really sure what she is using to the wounds other than bits and pieces of think she had left over including most recently Hydrofera Blue. She is using juxtalite stockings. She is having difficulty with her husband's recent illness "stroke". She is having to transport him to various doctors appointments. Dr. Marla Roe left her the option of a repeat debridement with ACEL however she has not been able to get the time to follow-up on this. She continues to have a fair amount of drainage out of these wounds with certainly precludes leaving dressings on all week 10/13/16; patient has not seen Dr. Marla Roe since she was last in our clinic. I'm not really sure what she is doing with the wounds, we did try to get her [facility] and I think she is actually using this most of the time. Because of drainage she states she has to change this every second day although this is an improvement from what she used to do. She went to see Dr. Doran Durand who did not think she had a muscular issue with regards to her feet, he referred  her to a neurologist and I think the appointment is sometime in June. I changed her back to Iodoflex which she has used in the past but not recently. 11/03/16; the patient has been using Iodoflex although she ran out of this. Still claims that there is a lot of drainage although the wound does not look like this. No surrounding erythema. She has not been back to see Dr. Marla Roe 11/24/16; the patient has been using Iodoflex again but she ran out of it 2 or 3 days ago. There is no major change in the condition of either one of these wounds in fact they are larger and covered in a thick adherent surface slough/nonviable tissue  especially on the left. She does not tolerate mechanical debridement in our clinic. Going back to see Dr. Marla Roe of plastic surgery for an operative debridement would seem reasonable. 12/15/16; the patient has not been back to see Dr. Marla Roe. She is been dealing with a series of illnesses and her husband which of monopolized her time. She is been using Sorbact which we largely supplied. She states the drainage is bad enough that it maximum she can go 2-3 days without changing the dressing 01/12/2017 -- the patient has not been back for about 4 weeks and has not seen Dr. Marla Roe not does she have any appointment pending. 01/23/17; patient has not seen Dr. Marla Roe even though I suggested this previously. She is using Santyl that was suggested last week by Dr. Con Memos this Cost her $16 through her insurance which is indeed surprising 02/12/17; continuing Santyl and the patient is changing this daily. A lot of drainage. She has not been back to see plastic surgery she is using an Ace wrap. Our intake nurse suggested wrap around stockings which would make a good reasonable alternative 02/26/17; patient is been using Santyl and changing this daily due to drainage. She has not been to see plastic surgery she uses in April Ace wrap to control the edema. She did obtain extremitease  stockings but stated that the edema in her leg was to big for these 03/20/17; patient is using Santyl and Anasept. Surfaces looked better today the area on the right is actually measuring a little smaller. She has states she has a lot of pain in her feet and ankles and is asking for a consult to pain control which I'll try to help her with through our case manager. 04/10/17; the patient arrives with better-looking wound surfaces and is slightly smaller wound on the left she is using a combination of Santyl and Anasept. She has an appointment or at least as started in the pain control center associated with [facility] 05/14/17; this is a patient who I followed for a prolonged period of time. She has venous insufficiency ulcers on her bilateral medial ankles. At one point I had this down to a much smaller wound on the left however these reopened and we've never been able to get these to heal. She has been using Santyl and Anasept gel although 2 weeks ago she ran out of the Anasept gel. She has a stable appearance of the wound. She is going to the wound care clinic at [facility]. They wanted do a nerve block/spinal block although she tells me she is reluctant to go forward with that. 05/21/17; this is a patient I have followed for many years. She has venous insufficiency ulcers on her bilateral medial ankles. Chronic pain and deformity in her ankles as well. She is been to see plastic surgery as well as orthopedics. Using PolyMem AG most recently/Kerramax/ABDs and 2 layer compression. She has managed to keep this on and she is coming in for a nurse check to change the dressing on Tuesdays, we see her on Fridays 06/05/17; really quite a good looking surface and the area especially on the right medial has contracted in terms of dimensions. Well granulated healthy-looking tissue on both sides. Even with an open curet there is nothing that even feels abnormal here. This is as good as I've seen this  in quite some time. We have been using PolyMem AG and bringing her in for a nurse check 06/12/17; really quite good surface on both of  these wounds. The right medial has contracted a bit left is not. We've been using PolyMem and AG and she is coming in for a nurse visit 06/19/17; we have been using PolyMem AG and bringing her in for a nurse check. Dimensions of her wounds are not better but the surfaces looked better bilaterally. She complained of bleeding last night and the left wound and increasing pain bilaterally. She states her wound pain is more neuropathic than just the wounds. There was some suggestion that this was radicular from her pain management doctor in talking to her it is really difficult to sort this out. 06/26/17; using PolyMem and AG and bringing her in for a nurse check as All of this and reasonably stable condition. Certainly not improved. The dimensions on the lateral part of the right leg look better but not really measuring better. The medial aspect on the left is about the same. 07/03/16; we have been using PolyMen AG and bringing her in for a nurse check to change the dressings as the wounds have drainage which precludes once weekly changing. We are using all secondary absorptive dressings.our intake nurse is brought up the idea of using a wound VAC/snap VAC on the wound to help with the drainage to see if this would result in some contraction. This is not a bad idea. The area on the right medial is actually looking smaller. Both wounds have a reasonable-looking surface. There is no evidence of cellulitis. The edema is well controlled 07/10/17; the patient was denied for a snap VAC by her insurance. The major issue with these wounds continues to be drainage. We are using wicked PolyMem AG and she is coming in for a nurse visit to change this. The wounds are stable to slightly improved. The surface looks vibrant and the area on the right certainly has shrunk in size but very  slowly 07/17/17; the patient still has large wounds on her bilateral medial malleoli. Surface of both of these wounds looks better. The dimensions seem to come and go but no consistent improvement. There is no epithelialization. We do not have options for advanced treatment products due to insurance issues. They did not approve of the wound VAC to help control the drainage. More recently we've been using PolyMem and AG wicked to allow drainage through. We have been bringing her in for a nurse visit to change this. We do not have a lot of options for wound care products and the home again due to insurance issues 07/24/17; the patient's wound actually looks somewhat better today. No drainage measurements are smaller still healthy-looking surface. We used silver collagen under PolyMen started last week. We have been bringing her in for a dressing change 07/31/17; patient's wound surface continued to look better and I think there is visible change in the dimensions of the wound on the right. Rims of epithelialization. We have been using silver collagen under PolyMen and bringing her in for a dressing change. There appears to be less drainage although she is still in need of the dressing change 08/07/17. Patient's wound surface continues to look better on both sides and the area on the right is definitely smaller. We have been using silver collagen and PolyMen. She feels that the drainage has been it has been better. I asked her about her vascular status. She went to see Dr. Aleda Grana at [facility] and had some form of ablation. I don't have much detail on this. I haven't my notes from 2016 that she  was not a candidate for any further ablation but I don't have any more information on this. We had referred her to vein and vascular I don't think she ever went. He does not have a history of PAD although I don't have any information on this either. We don't even have ABIs in our record 08/14/17; we've been  using silver collagen and PolyMen cover. And putting the patient and compression. She we are bringing her in as a nurse visit to change this because ofarge amount of drainage. We didn't the ABIs in clinic today since they had been done in many moons 1.2 bilaterally. She has been to see vein and vascular however this was at [facility] and she had ablation although I really don't have any information on this all seemed biking get a report. She is also been operatively debrided by plastic surgery and had a cell placed probably 8-12 months ago. This didn't have a major effect. We've been making some gains with current dressings 08/19/17-She is here in follow-up evaluation for bilateral medial malleoli ulcers. She continues to tolerate debridement very poorly. We will continue with recently changed topical treatment; if no significant improvement may consider switching to Iodosorb/Iodoflex. 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 She has been using silver collagen and PolyMem. I believe she has been used and tried on Iodoflex before. 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 These have been very refractory over time. We have been using silver collagen and PolyMen. She is coming in once a week for a doctor's and once a week for nurse visits. We are actually making some progress 09/18/17; the patient's wounds are smaller especially on the right medial. She arrives today to upset to consider even washing these off with Anasept which I think is been part of the reason this is been closing. We've been using collagen covered in PolyMen otherwise. It is noted that she has a small area of folliculitis on the right medial calf that. As we are wrapping her legs I'll give her  a short course of doxycycline to make sure this doesn't amount to anything. She is a long list of complaints today including imbalance, shortness of breath on exertion, inversion of her left ankle. With regards to the latter complaints she is been to see orthopedics and they offered her a tendon release surgery I believe but wanted her wounds to be closed first. I have recommended she go see her primary physician with regards to everything else. 09/25/17; patient's wounds are about the same size. We have made some progress bilaterally although not in recent weeks. She will not allow me T wash these wounds with Anasept even if she is doing her cell. Wheeze we've been using collagen covered in PolyMen. Last week she had a small area of folliculitis this is now opened into a small wound. She completed 5 days of trimethoprim sulfamethoxazole 10/02/17; unfortunately the area on her left medial ankle is worse with a larger wound area towards the Achilles. The patient complains of a lot of pain. She will not allow debridement although visually I don't think there is anything to debridement in any case. We have been using silver collagen and PolyMen for several months now. Initially we are making some progress although I'm not  really seeing that today. We will move back to [facility]. His admittedly this is a bit of a repeat however I'm hoping that his situation is different now. The patient tells me she had her leg on the left give out on her yesterday this is process some pain. 10/09/17; the patient is seen twice a week largely because of drainage issues coming out of the chronic medial bimalleolar wounds that are chronic. Last week the dimensions of the one on the left looks a little larger I changed her to [facility]. She comes in today with a history of terrible pain in the bilateral wound areas. She will not allow debridement. She will not even allow a tissue culture. There is no surrounding erythema  no no evidence of cellulitis. We have been putting her Kerlix Coban man. She will not allow more aggressive compression as there was a suggestion to put her in 3 layer wraps. 10/16/17; large wounds on her bilateral medial malleoli. These are chronic. Not much change from last week. The surface looks have healthy but absolutely no epithelialization. A lot of pain little less so of drainage. She will not allow debridement or even washing these off in the vigorous fashion with Anasept. 10/23/17; large wounds on her bilateral malleoli which are chronic. Some improvement in terms of size perhaps on the right since last time I saw these. She states that after we increased the 3 layer compression there was some bleeding, when she came in for a nurse visit she did not want 3 layer compression put back on about our nurse managed to convince her. She has known chronic venous visit issues and I'm hoping to get her to tolerate the 3 layer compression. using Hydrofera Blue 10/30/17; absolutely no change in the condition of either wound although we've had some improvement in dimensions on the right.. Attempted to put her in 3 layer compression she didn't tolerated she is back in 2 layer compression. We've been using Hydrofera Blue We looked over her past records. She had venous reflux studies in November 2016. There was no evidence of deep venous reflux on the right. Superficial vein did not show the greater saphenous vein at think this is been previously ablated the small saphenous vein was within normal limits. The left deep venous system showed no DVT the vessels were positive for deep venous reflux in the posterior tibial veins at the ankle. The greater saphenous vein was surgically absent small saphenous vein was within normal limits. She went to vein and vascular at [facility]. I believe she had an ablation on the left greater saphenous vein. I'll update her reflux studies perhaps ever reviewed by vein and  vascular. We've made absolutely no progress in these wounds. Will also try to read and TheraSkins through her insurance 11/06/17; W the patient apparently has a 2 week follow-up with vein and vascular I like him to review the whole issue with regards to her previous vascular workup by Dr. Aleda Grana. We've really made no progress on these wounds in many months. She arrives today with less viable looking surface on the left medial ankle wound. This was apparently looking about the same on Tuesday when she was here for nurse visit. 11/13/17; deep tissue culture I did last time of the left lower leg showed multiple organisms without any predominating. In particular no Staphylococcus or group A strep were isolated. We sent her for venous reflux studies. She's had a previous left greater saphenous vein stripping and I  think sclerotherapy of the right greater saphenous vein. She didn't really look at the lesser saphenous vein this both wounds are on the medial aspect. She has reflux in the common femoral vein and popliteal vein and an accessory vein on the right and the common femoral vein and popliteal vein on the left. I'm going to have her go to see vein and vascular just the look over things and see if anything else beside aggressive compression is indicated here. We have not been able to make any progress on these wounds in spite of the fact that the surface of the wounds is never look too bad. 11/20/17; no major change in the condition of the wounds. Patient reports a large amount of drainage. She has a lot of complaints of pain although enlisting her today I wonder if some of this at least his neuropathic rather than secondary to her wounds. She has an appointment with vein and vascular on 12/30/17. The refractory nature of these wounds in my mind at least need vein and vascular to look over the wounds the recent reflux studies we did and her history to see if anything further can be done here. I also  note her gait is deteriorated quite a bit. 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 She has a bilateral Trendelenburg gait. I wonder if this is neuropathic or perhaps multilevel radicular. 11/27/17; her wounds actually looks slightly better. Healthy-looking granulation tissue a scant amount of epithelialization. [place] [employer] will not pay for [employer]. They will play for tri layer Oasis and Dermagraft. This is not a diabetic ulcer. We'll try for the tri layer Oasis. She still complains of some drainage. She has a vein and vascular appointment on 12/30/17 12/04/17; the wounds visually look quite good. Healthy-looking granulation with some degree of epithelialization. We are still waiting for response to our request for trial to try layer Oasis. Her appointment with vascular to review venous and arterial issues isn't sold the end of July 7/31. Not allow debridement or even vigorous cleansing of the wound surface. 12/18/17; slightly smaller especially on the right. 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 We've been using Hydrofera Blue. We still are looking into triple layer Oasis through her insurance 01/08/18 on evaluation today patient's wound actually appears to be showing signs of good improvement at this point in time. She has been tolerating the dressing changes without complication. Fortunately there does not appear to be any evidence of infection at this point in time. We have been utilizing silver nitrate which does seem to be of benefit for her which is also good news. Overall I'm very happy with how things seem to be both regards appearance as well as measurement. Patient did see Dr. Bridgett Larsson for evaluation on 12/30/17. In his assessment he felt that stripping would not likely add much more than chronic compression to the patient's healing process. His recommendation was to follow-up in three months with Dr. Doren Custard if she hasn't healed in order to consider  referral back to you and see vascular where she previously was in a trial and was able to get her wound to heal. I'll be see what she feels she when you staying compression and he reiterated this as well. 01/13/18 on evaluation today patient appears to actually be doing very well in regard to her bilateral medial malleolus ulcers. She seems to have tolerated the chemical cauterization with silver nitrate last week she did have some pain through that evening  but fortunately states that I'll be see since it seems to be doing better she is overall pleased with the progress. 01/21/18; really quite a remarkable improvement since I've last seen these wounds. We started using silver nitrate specially on the islands of hyper granulation which for some reason her around the wound circumference. This is really done quite nicely. Primary dressing Hydrofera Blue under 4 layer compression. She seems to be able to hold out without a nurse rewrap. Follow-up in 1 week 01/28/18; we've continued the hydrofera blue but continued with chemical cauterization to the wound area that we started about a month ago for irregular hyper granulation. She is made almost stunning improvement in the overall wound dimensions. I was not really expecting this degree of improvement in these chronic wounds 02/05/18; we continue with Hydrofera Bluebut of also continued the aggressive chemical cauterization with silver nitrate. We made nice progress with the right greater than left wound. 02/12/18. We continued with Hydrofera Blue after aggressive chemical cauterization with silver nitrate. We appear to be making nice progress with both wound areas 02/19/2018; we continue with [facility] after washing the wounds vigorously with Anasept spray and chemical cauterization with silver nitrate. We are making excellent progress. The area on the right's just about closed 02/26/2018. The area on the left medial ankle had too much necrotic debris  today. I used a #5 curette we are able to get most of the soft. I continued with the silver nitrate to the much smaller wound on the right medial ankle she had a new area on her right lower pretibial area which she says was due to a role in her compression 03/05/2018; both wound areas look healthy. Not much change in dimensions from last week. I continue to use silver nitrate and Hydrofera Blue. The patient saw Dr. Doren Custard of vein and vascular. He felt she had venous stasis ulcers. He felt based on her previous arteriogram she should have adequate circulation for healing. Also she has deep venous reflux but really no significant correctable superficial venous reflux at this time. He felt we should continue with conservative management including leg elevation and compression 04/02/2018; since we last saw this woman about a month ago she had a fall apparently suffered a pelvic fracture. I did not look up the x-ray. Nevertheless because of pain she literally was bedbound for 2 weeks and had home health coming out to change the dressing. Somewhat predictably this is resulted in considerable improvement in both wound areas. The right is just about closed on the medial malleolus and the left is about half the size. 04/16/2018; both her wounds continue to go down in size. Using Hydrofera Blue. 05/07/18; both her wounds appeared to be improving especially on the right where it is almost closed. We are using Hydrofera Blue 05/14/2018; slightly worse this week with larger wounds. Surface on the left medial not quite as good. We have been using Hydrofera Blue 05/21/18; again the wounds are slightly larger. Left medial malleolus slightly larger with eschar around the circumference. We have been using Hydrofera Blue undergoing a wraps for a prolonged period of time. This got a lot better when she was more recumbent due to a fall and a back injury. I change the primary dressing the silver alginate today. She did not  tolerate a 4 layer compression previously although I may need to bring this up with her next time 05/28/2018; area on the left medial malleolus again is slightly larger with more drainage. Area  on the right is roughly unchanged. She has a small area of folliculitis on the right medial just on the lower calf. This does not look ominous. 06/03/2018 left medial malleolus slightly smaller in a better looking surface. We used silver nitrate on this last time with silver alginate. The area on the right appears slightly smaller 1/10; left medial malleolus slightly smaller. Small open area on the right. We used silver nitrate and silver alginate as of 2 weeks ago. We continue with the wound and compression. These got a lot better when she was off her feet 1/17; right medial malleolus wound is smaller. The left may be slightly smaller. Both surfaces look somewhat better. 1/24; both wounds are slightly smaller. Using silver alginate under Unna boots 1/31; both wounds appear smaller in fact the area on the right medial is just about closed. Surface eschar. We have been using silver alginate under Unna boots. The patient is less active now spends let much less time on her feet and I think this is contributed to the general improvement in the wound condition 2/7; both wounds appear smaller. I was hopeful the right medial would be closed however there there is still the same small open area. Slight amount of surface eschar on the left the dimensions are smaller there is eschar but the wound edges appear to be free. We have been using silver alginate under Unna boot's 2/14; both wounds once again measure smaller. Circumferential eschar on the left medial. We have been using silver alginate under Unna boots with gradual improvement 2/21; the area on the right medial malleolus has healed. The area on the left is smaller. We have been using silver alginate and Unna boots. We can discharge wrapping the right leg she has  20/30 stockings at home she will need to protect the scar tissue in this area 2/28; the area on the right medial malleolus remains closed the patient has a compression stocking. The area on the left is smaller. We have been using silver alginate and Unna boots. 3/6 the area on the right medial ankle remains closed. Good edema control noted she is using her own compression stocking. The area on the left medial ankle is smaller. We have been managing this with silver alginate and Unna boots which we will continue today. 3/13; the area on the right medial ankle remains closed and I'm declaring it healed today. When necessary the left is about the same still a healthy-looking surface but no major change and wound area. No evidence of infection and using silver alginate under unna and generally making considerable improvement 3/27 the area on the right medial ankle remains closed the area on the left is about the same as last week. Certainly not any worse we have been using silver alginate under an Unna boot 4/3; the area on the right medial ankle remains closed per the patient. We did not look at this wound. The wound on the left medial ankle is about the same surface looks healthy we have been using silver alginate under an Unna boot 4/10; area on the right medial ankle remains closed per the patient. We did not look at this wound. The wound on the left medial ankle is slightly larger. The patient complains that the [facility] caused burning pain all week. She also told us that she was a lot more active this week. Changed her back to silver alginate 4/17; right medial ankle still closed per the patient. Left medial ankle is slightly larger.  Using silver alginate. She did not tolerate Hydrofera Blue on this area 4/24; right medial ankle remains closed we have not look at this. The left medial ankle continues to get larger today by about a centimeter. We have been using silver alginate under Unna  boots. She complains about 4 layer compression as an alternative. She has been up on her feet working on her garden 5/8; right medial ankle remains closed we did not look at this. The left medial ankle has increased in size about 100%. We have been using silver alginate under Unna boots. She noted increased pain this week and was not surprised that the wound is deteriorated 5/15; no major change in SA however much less erythema ( one week of doxy ocellulitis). 5/22-66 year old female returns at 1 week to the clinic for left medial ankle wound for which we have been using silver alginate under 3 layer compression She was placed on DOXY at last visit - the wound is wider at this visit. She is in 3 layer compression 5/29; change to [facility] last week. I had given her empiric doxycycline 2 weeks ago for a week. She is in 3 layer compression. She complains of a lot of pain and drainage on presentation today. 6/5; using Hydrofera Blue. I gave her doxycycline recently empirically for erythema and pain around the wound. Believe her cultures showed enterococcus which not would not have been well covered by doxycycline nevertheless the wound looks better and I don't feel specifically that the enterococcus needs to be covered. She has a new what looks like a wrap injury on her lateral left ankle. 6/12; she is using Hydrofera Blue. She has a new area on the left anterior lower tibial area. This was a wrap injury last week. 6/19; the patient is using Hydrofera Blue. She arrived with marked inflammation and erythema around the wound and tenderness. 12/01/18 on evaluation today patient appears to be doing a little bit better based on what I'm hearing from the standpoint of lassos evaluation to this as far as the overall appearance of the wound is concerned. Then sometime substandard she typically sees Dr. Dellia Nims. Nonetheless overall very pleased with the progress that she's made up to this point. No fevers,  chills, nausea, or vomiting noted at this time. 7/10; some improvement in the surface area. Aggressively debrided last week apparently. I went ahead with the debridement today although the patient does not tolerate this very well. We have been using Iodoflex. Still a fair amount of drainage 7/17; slightly smaller. Using Iodoflex. 7/24; no change from last week in terms of surface area. We have been using Iodoflex. Surface looks and continues to look somewhat better 7/31; surface area slightly smaller better looking surface. We have been using Iodoflex. This is under Unna boot compression 8/7-Patient presents at 1 week with Unna boot and Iodoflex, wound appears better 8/14-Patient presents at 1 week with Iodoflex, we use the Unna boot, wound appears to be stable better.Patient is getting Botox treatment for the inversion of the foot for tendon release, Next week 8/21; we are using Iodoflex. Unna boot. The wound is stable in terms of surface area. Under illumination there is some areas of the wound that appear to be either epithelialized or perhaps this is adherent slough at this point I was not really clear. It did not wipe off and I was reluctant to debride this today. 8/28; we are using Iodoflex in an Unna boot. Seems to be making good improvement. 9/4; using  Iodoflex and wound is slightly smaller. 9/18; we are using Iodoflex with topical silver nitrate when she is here. The wound continues to be smaller 10/2; patient missed her appointment last week due to GI issues. She left and Iodoflex based dressing on for 2 weeks. Wound is about the same size about the size of a dime on the left medial lower 10/9 we have been using Iodoflex on the medial left ankle wound. She has a new superficial probable wrap injury on the dorsal left ankle 10/16; we have been using Hydrofera Blue since last week. This is on the left medial ankle 10/23; we have been using Hydrofera Blue since 2 weeks ago. This is on the  left medial ankle. Dimensions are better 11/6; using Hydrofera Blue. I think the wound is smaller but still not closed. Left medial ankle 11/13; we have been using Hydrofera Blue. Wound is certainly no smaller this week. Also the surface not as good. This is the remanent of a very large area on her left medial ankle. 11/20; using Sorbact since last week. Wound was about the same in terms of size although I was disappointed about the surface debris 12/11; 3-week follow-up. Patient was on vacation. Wound is measuring slightly larger we have been using Sorbact. 12/18; wound is about the same size however surface looks better last week after debridement. We have been using Sorbact under compression 1/15 wound is probably twice the size of last time increased in length nonviable surface. We have been using Sorbact. She was running a mild fever and missed her appointment last week 1/22; the wound is come down in size but under illumination still a very adherent debris we have been Hydrofera Blue that I changed her to last week 1/29; dimensions down slightly. We have been using Hydrofera Blue 2/19 dimensions are the same however there is rims of epithelialization under illumination. Therefore more the surface area may be epithelialized 2/26; the patient's wound actually measures smaller. The wound looks healthy. We have been using Hydrofera Blue. I had some thoughts about running Apligraf then I still may do that however this looks so much better this week we will delay that for now 3/5; the wound is small but about the same as last week. We have been using Hydrofera Blue. No debridement is required today. 3/19; the wound is about the size of a dime. Healthy looking wound even under illumination. We have been using Hydrofera Blue. No mechanical debridement is necessary 3/26; not much change from last week although still looks very healthy. We have been using Hydrofera Blue under Unna boots Patient was  offered an ankle fusion by podiatry but not until the wound heals with a proceed with this. 4/9; the patient comes in today with her original wound on the medial ankle looking satisfactory however she has some uncontrolled swelling in the middle part of her leg with 2 new open areas superiorly just lateral to the tibia. I think this was probably a wrap issue. She said she felt uncomfortable during the week but did not call in. We have been using Hydrofera Blue 4/16; the wound on the medial ankle is about the same. She has innumerable small areas superior to this across her mid tibia. I think this is probably folliculitis. She is also been working in the yard doing a lot of sweating 4/30; the patient issue on the upper areas across her mid tibia of all healed. I think this was excessive yard work if I remember.  Her wound on the medial ankle is smaller. Some debris on this we have been using Hydrofera Blue under Unna boots 5/7; mid tibia. She has been using Hydrofera Blue under an Unna wrap. She is apparently going for her ankle surgery on June 3 10/28/19-Patient returns to clinic with the ankle wound, we are using Hydrofera Blue under Unna wrap, surgery is scheduled for her left foot for June 3 so she will be back for nurse visit next week READMISSION 01/17/2020 Mrs. Messamore is a 66 year old woman we have had in this clinic for a long period of time with severe venous hypertension and refractory wounds on her medial lower legs and ankles bilaterally. 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 When she was off her legs for a prolonged period example when she fell and suffered a compression fracture things would heal up quite nicely. She is now retired and we managed to heal up the right medial leg wound. The left one was very tiny last time I saw this although still refractory. 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 It got to the point where this was interfering with her walking and she elected to proceed with a ankle arthrodesis to straighten her her ankle and leave her with a functional outcome for mobilization. The patient was referred to Dr. Doren Custard and really this took some time to arrange. Dr. Doren Custard saw her on 12/07/2019. Once again he verified that she had no arterial issues. She had previously had an angiogram several years ago. Follow-up ABIs on the left showed an ABI of 1.12 with triphasic waveforms and a TBI of 0.92. She is felt to have chronic deep venous insufficiency but I do not think it was felt that anything could be done from about this from an ablation point of view. At the time Dr. Doren Custard saw this patient the wounds actually look closed via the pictures in his clinic. The patient finally underwent her surgery on 12/15/2019. This went reasonably well and there was a good anatomic outcome. She developed a small distal wound dehiscence on the lateral part of the surgical wound. However more problematically she is developed recurrence of the wound on the medial left ankle. There are actually 2 wounds here one in the distal lower leg and 1 pretty much at the level of the medial malleolus. It is a more distal area that is more problematic. She has been using Hydrofera Blue which started on Friday before this she was simply Ace wrapping. There was a culture done that showed Pseudomonas and she is on ciprofloxacin. A recent CNS on 8/11 was negative. The patient reports some pain but I generally think this is improving. She is using a cam boot completely nonweightbearing using a walker for pivot transfers and a wheelchair 8/24; not much improvement unfortunately she has a surgical wound on the lateral part in the venous insufficiency wound medially. The bottom part of the medial  insufficiency wound is still necrotic there is exposed tendon here. We have been using Hydrofera Blue under compression. Her edema control is however better 8/31; patient in for follow-up of his surgical wound on the lateral part of her left leg and chronic venous insufficiency ulcers medially. We put her back in compression last week. She comes in today  with a complaint of 3 or 4 days worth of increasing pain. She felt her cam walker was rubbing on the area on the back of her heel. However there is intense erythema seems more likely she has cellulitis. She had 2 cultures done when she was seeing podiatry in the postop. One of them in late July showed Pseudomonas and she received a course of ciprofloxacin the other was negative on 8/11 she is allergic to penicillin with anaphylactoid complaints of hives oral swelling via information in epic 9/9; when I saw this patient last week she had intense anterior erythema around her wound on the right lateral heel and ankle and also into the right medial heel. Some of this was no doubt drainage and her walker boot however I was convinced she had cellulitis. I gave her Levaquin and Bactrim she is finishing up on this now. She is following up with Dr. [name] he saw her yesterday. He is taken her out of the walking boot of course she is still nonweightbearing. Her x-ray was negative for any worrisome features such as soft tissue air etc. Things are a lot better this week. She has home health. We have been using Hydrofera Blue under an [employer] which she put back on yesterday. I did not wrap her last week 9/17; her surrounding skin looks a lot better. In fact the area on the left lateral ankle has just a scant amount of eschar. The only remaining wound is the large area on the left medial ankle. Probably about 60% of this is healthy granulation at the surface however she has a significant divot distally. This has adherent debris in it. I been using debridement and  silver collagen to try and get this area to fill-in although I do not think we have made much progress this week 9/24; the patient's wound on the left medial ankle looks a lot better. The deeper divot area distally still requires debridement but this is cleaning up quite nicely we have been using silver collagen. The patient is complaining of swelling in her foot and is worried that that is contributing to the nonhealing of the ankle wound. She is also complaining of numbness in her anterior toes 10/4; left medial ankle. The small area distally still has a divot with necrotic material that I have been debriding away. This has an undermining area. She is approved for Apligraf. She saw Dr. [name] her surgeon on 10/1. I think he declared himself is satisfied with the condition of things. Still nonweightbearing till the end of the month. 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 Her surgical wound is well closed. There is no evidence of infection 10/11; the patient arrived in clinic today with the expectation that we be able to put an Apligraf on this area after debridement however she arrives with a relatively large amount of green drainage on the dressing. The patient states that this started on Friday. She has not been systemically unwell. 10/19; culture I did last week showed both Enterococcus and Pseudomonas. I think this came in separate parts because I stopped her ciprofloxacin I gave her and prescribed her linezolid however now looking at the final culture result this is Pseudomonas which is resistant to quinolones. She has not yet picked up the linezolid apparently phone issues. We are also trying to get a topical antibiotic out of [facility] in [place] they can be applied by home health. She is still having green drainage 10/16;  the patient has her topical antibiotic from [facility] in [place]. This is a compounded gel with vancomycin and ciprofloxacin  and gentamicin. We are applying this on the wound bed with silver alginate over the top with Unna boot wraps. She arrives in clinic today with a lot less ominous looking drainage although she is only use this topical preparation once the second time today. She sees Dr. [name] her surgeon on Friday she has home health changing the dressing 11/2; still using her compounded topical antibiotic under silver alginate. Surface is cleaning up there is less drainage. We had an Apligraf for her today and I elected to apply it. A light coating of her antibiotic 04/25/2020 upon evaluation today patient appears to be doing well with regard to her ankle ulcer. There is a little bit of slough noted on the surface of the wound I am can have to perform sharp debridement to clear this away today. With that being said other than that fact overall I feel like she is making progress and we do see some new epithelial growth. There is also some improvement in the depth of the wound and that distal portion. There is little bit of slough there as well. 12/7; 2-week follow-up. Apligraf #3. Dimensions are smaller. Closing in especially inferiorly. Still some surface debris. Still using the [facility] topical antibiotic but I told her that I don't think this needs to be renewed 12/21; 2-week follow-up. Apligraf #4 dimensions are smaller. Nice improvement 06/05/2020; 2-week follow-up. The patient's wound on the left medial ankle looks really excellent. Nice granulation. Advancing epithelialization no undermining no evidence of infection. We would have to reapply for another Apligraf but with the condition of this wound I did not feel strongly about it. We used Hydrofera Blue under the same degree of compression. She follows up with Dr. [name] her surgeon a week Friday 06/13/2020 upon evaluation today patient appears to be doing excellent in regard to her wound. She has been tolerating the dressing changes without complication.  Fortunately there is no signs of active infection at this time. No fevers, chills, nausea, vomiting, or diarrhea. She was using Hydrofera Blue last week. 06/20/2020 06/20/2020 on evaluation today patient actually appears to be doing excellent in regard to her wound. This is measuring better and looking much better as well. She has been using the collagen that seems to be doing better for her as well even though the [facility] was and is not sticking or feeling as rough on her wound. She did see Dr. [name] on Friday he is very pleased he also stated none of the hardware has shifted. That is great news 1/27; the patient has a small clean wound all that is remaining. I agree that this is too small to really consider an Apligraf. Under illumination the surface is looking quite good. We have been using collagen although the dimensions are not any better this week 2/2; the patient has a small clean wound on the left medial ankle. Although this left of her substantial original areas. Measurements are smaller. We have been using polymen Ag under an Haematologist. 2/10; small area on the left medial ankle. This looks clean nothing to debride however dimensions are about the same we have been using polymen I think now for 2 weeks 2/17; not much change in surface area. We have been using polymen Ag without any improvement. 3/17; 1 month follow-up. The patient has been using endoform without any improvement in fact I think this  looks worse with more depth and more expansion 3/24; no improvement. Perhaps less debris on the surface. We have been using Sorbact for 1 week 4/4; wound measures larger. She has edema in her leg and her foot which she tells as her wrap came down. We have been using Unna boots. Sorbact of the wound. She has been approved for Apligraf 09/12/2020 upon evaluation today patient appears to be doing well with regard to her wound. We did get the Apligraf reapproved which is great news we have  that available for application today. Fortunately there is no signs of infection and overall the patient seems to be doing great. The wound bed is nice and clean. 4/27; patient presents for her second application of Apligraf. She states over the past week she has been on her feet more often due to being outside in her garden. She has noted more swelling to her foot as a result. She denies increased warmth, pain or erythema to the wound site. 10/10/2020 upon evaluation today patient appears to be doing well with regard to her wound which does not appear to be quite as irritated as last week from what I am hearing. With that being said unfortunately she is having issues with some erythema and warmth to touch as well as an increase size. I do believe this likely is infected. 10/17/2020 upon evaluation today patient appears to be doing excellent in regard to her wound this is significantly improved as compared to last week. Fortunately I think that the infection is much better controlled at this point. She did have evidence of both Enterococcus as well as Staphylococcus noted on culture. Enterococcus really would not be helped significantly by the Cipro but the wound is doing so much better I am under the assumption that the Staphylococcus is probably the main organism that is causing the current infection. Nonetheless I think that she is doing excellent as far as that is concerned and I am very pleased in that regard. I would therefore recommend she continue with the Cipro. 10/31/2020 upon evaluation today patient appears to be doing well with regard to her wound. She has been tolerating the dressing changes without complication. Fortunately there is no signs of active infection and overall I am extremely pleased with where things stand today. No fevers, chills, nausea, vomiting, or diarrhea. With that being said she does have some green drainage coming from the wound and although it looks okay I am a little  concerned about the possibility of a continuing infection. Specifically with Pseudomonas. For that reason I will go ahead and send in a prescription for Cipro for her to be continued. 11/14/2020 upon evaluation today patient appears to be doing very well currently in regard to her wound on her leg. She has been tolerating the dressing changes without complication. Fortunately I feel like the infection is finally under good control here. Unfortunately we do not have the Apligraf for application today although we can definitely order to have it in place for next week. That will be her fifth and final of the current series. Nonetheless I feel like her wound is really doing quite well which is great news. 11/21/2020 upon evaluation today patient appears to be doing well with regard to her wound on the medial ankle. Fortunately I think the infection is under control and I do believe we can go ahead and reapply the Apligraf today. She is in agreement with that plan. There does not appear to be any signs of  active infection at this time which is great news. No fevers, chills, nausea, vomiting, or diarrhea. 12/05/2020 upon evaluation today patient's wound bed actually showed signs of good granulation epithelization at this point. There does not appear to be any signs of infection which is great news and overall very pleased with where things stand. Overall the patient seems to be doing fairly well in my opinion with regard to her wound although I do believe she continues to build up a lot of biofilm I think she could benefit from using PuraPly at this point. 12/12/2020 upon evaluation today patient's wound actually appears to be doing decently well today. The Unna boot has not been quite as well-tolerated so that more uncomfortable for her and even causing some pressure over the plate on the lateral portion of her foot which is 90 where the wound is. There did not appear to be any significant deep tissue injury  with that there may be a minimal change in the skin noted I think that we may want to go back to the Coflex 2 layer which is a little bit easier on her skin it seems. 12/19/2020 upon evaluation today patient actually seems to be making great progress with the PuraPly currently. She in fact seems to be much better as far as the overall appearance of the wound bed is concerned I am very happy in this regard. I do not see any signs of of infection which is great news as well. No fevers, chills, nausea, vomiting, or diarrhea. 12/26/2020 upon evaluation today patient appears to actually be doing better in regard to her wound on the left medial ankle region. The surface of the wound is actually doing significantly better which is great news. There does not appear to be any signs of infection which is also great news and in general I am extremely pleased with where we stand today. 01/02/2021 upon evaluation today patient appears to be doing well with regard to her wound. In fact this is showing signs of excellent improvement and very pleased with where things stand. In fact the last 3 appointments have all shown signs of this getting smaller which is excellent news. I have not even had to perform any debridement and today is no exception. Overall I feel like this is dramatically improved compared to previous. Today is PuraPly application #4. 8/41/3244 upon evaluation today patient appears to be doing excellent in regard to her wound this is continue to show signs of improvement and overall I am extremely pleased with where we stand today. She is actually here for PuraPly application #5. Every time we have applied this she is noted definite improvement on measurements. 01/23/2021 upon evaluation today patient is actually making good progress in regard to her wound. This was actually on just a little bit longer this time compared to previous due to the fact that she did have to go out of town. She is actually here  for PuraPly application #6. We have definitely been seeing improvements in the overall quality of the tissue on the surface of the wound which is awesome news. In general I think that the patient seems to be continuing to make great progress here. 01/30/2021 upon evaluation today patient's wound is actually doing excellent. There is really not any significant biofilm buildup which is great news and overall I am extremely pleased with where things stand today. There does not appear to be any signs of active infection. No fevers, chills, nausea, vomiting, or diarrhea. 02/06/2021  upon evaluation today patient's wound is actually showing signs of excellent improvement which is great news. We continue to see the benefit of the PuraPly this is doing a great job the wound seems not really irritated whatsoever and is showing signs of good granulation at this point. Overall I am extremely happy with what we are seeing. The patient likewise is happy to hear all of this as well. 02/13/2021 upon evaluation today patient appears to be doing well with regard to her wound. This again is measuring smaller today and I am very pleased in this regard. Fortunately there does not appear to be any signs of active infection at this time which is good news from a systemic standpoint. Locally there is still some significant drainage which she does have is concerned about infection locally. No fevers, chills, nausea, vomiting, or diarrhea. 02/20/2021 upon evaluation today patient actually appears to be doing decently well in regard to her wound. She has been tolerating the dressing changes without complication. I do believe the PuraPly is helping wound bed does appear to be doing better. There is no evidence of active infection locally or systemically at this point visually although on fluorescence imaging there still appear to be bacterial load and bioburden noted. 02/27/2021 upon evaluation today patient fortunately continues to  show signs of improvement with use of the PuraPly currently. Subsequently we did review her culture results and to be honest I think that the Bactrim is probably the best option to have her continue at this point. For that reason I am get a go ahead and send in a refill today for her for an additional 2 weeks. Nonetheless I think that we are making excellent progress here. It was Enterococcus and Staphylococcus that were noted she is allergic to penicillin so there is not much I can do from the Enterococcus standpoint though the staff does seem to be sensitive to the Bactrim. 03/06/2021 upon evaluation today patient appears to be doing well with regard to her wound. This is definitely showing signs of improvement which is great news and overall I am extremely pleased with where things stand today. There does not appear to be any signs of active infection which is great news and overall and I do believe that we are headed in the appropriate direction I think the PuraPly is doing an awesome job for her. 03/20/2021 upon evaluation today patient actually appears to be doing well with regard to her wound. We have been using the PuraPly although last week when I was out of the office they actually just used endoform nonetheless this still seems to be doing great. Fortunately there does not appear to be any signs of active infection which is great news overall I think the patient is making wonderful progress. 03/27/2021 upon evaluation today patient appears to be doing well with regard to her wound in fact there is a little drainage but otherwise her does not appear to be any major signs of open wounds nor infection at this time. Overall I am extremely pleased with where things stand. No fevers, chills, nausea, vomiting, or diarrhea. 04/03/2021 upon evaluation today patient presents for reevaluation here in the clinic and overall she seems to be doing quite well. Fortunately there does not appear to be any  signs of active infection which is great news and in general I am very pleased with how things appear. There is still a very small opening remaining but in general she is looking much better. Fortunately I  do not think there is any need for the PuraPly at this point. 04/10/2021 upon evaluation today patient's wound is actually showing signs of excellent improvement. I am actually very pleased with where it stands today. I think she is very close to complete closure in fact this may be healed today. Either way I think that she may benefit from using her own compression socks for the next week and then will subsequently see how things stand following. 04/17/2021 upon evaluation today patient appears to be doing well with regard to her wound on the ankle region. This is actually showing signs of excellent improvement which is great news. I do not see any signs of active infection at this time which is great and no open wound. With that being said on the lateral portion of her ankle right at the base of her plate she actually has an area of pressure that occurred over the past week when she was not in the wrap or we been padding her. This does seem to be an issue which I think is good to be an ongoing problem I am can actually get in touch with Dr. [name] today to see what we can do in that regard. 04/24/2021 upon evaluation today patient appears to be doing well with regard to her ankle region for the most part although there does appear to be some fluid trapped underneath the area where we thought this was healed last week. Unfortunately I think at this time that we can have to reopen and clean away some of this area and then subsequently depending on how things do over the next week or so we will see about discharge but right now I do not think that is the right way to go. She voiced understanding. 05/01/2021 upon evaluation today patient's wound still appears to be open unfortunately. I do believe that  we do need to see what we can do about trying to get this closed I believe compression wrapping is probably can to be indicated here. Fortunately there is no signs of active infection at this time. 05/08/2021 upon evaluation today patient appears to be doing okay in regard to her ulcer this is very superficial and we will see any major issues here. The biggest problem currently that I find is that we just cannot get this area to completely seal up. She seemed to almost be and then things digressed a little bit. Right now is not too bad but we do need to see if we can make some difference here. I think maybe switching over to collagen may be better. 05/15/2021 upon evaluation today patient actually appears to be doing pretty well in regard to her wound. This is measuring a little smaller and looking much better. She did see orthopedics today there to me making her a insert for her shoe to try to help straighten out her gait. I think this can be beneficial for her as well. 05/22/2021 upon evaluation today patient appears to be doing well currently in regard to her wound although is not significantly smaller unfortunately this at least does not show any signs of active infection at this time which is great news. No fevers, chills, nausea, vomiting, or diarrhea. I am going to see about switching over to endoform that is what we wanted to use in the past always been on backorder. 06/05/2021 upon evaluation today patient appears to be doing somewhat poorly in regard to her wound. This is really not showing the signs of  improvement that I would have like to have seen. Fortunately I do not see any signs of active infection locally nor systemically which is great news. With that being said even though I do not see the sign she is having some increased discomfort which is not good. She is also having some issues here with the wound looking a little larger today compared to where its been. With that being said  overall I believe that we probably need to consider at least that there may be a subacute infection here and that is what is going on. We have tried multiple different medications and dressings which have done well for her in the past without seeing any signs of improvement. She is in agreement with addressing this as such. 06/12/2020 upon evaluation today patient's wound is actually showing some signs of improvement today. In fact I do not even see any need for sharp debridement based on what I see at this point today. Fortunately there is no evidence of active infection currently which is great news. No fevers, chills, nausea, vomiting, or diarrhea. 06/26/2021 upon evaluation today patient appears to be doing well with regard to her wounds. Fortunately there does not appear to be any signs of active infection at this time which is great news. I do believe the Bactrim is doing well for her. Fortunately there does not appear to be any signs of active infection at this time. With the hardware in place and everything else I do believe extending this for 2 additional weeks for 4 weeks total would be advisable. 07/03/2021 upon evaluation today patient appears to be doing well with regard to her wound. She has been tolerating the dressing changes which is great news. Fortunately there does not appear to be any evidence of active infection locally nor systemically at this time. 07/10/2021 upon evaluation today patient appears to be doing well with regard to her wound I do feel that the PuraPly has done an awesome job for her over the past week. Fortunately I do not see any signs of active infection at this time. No fevers, chills, nausea, vomiting, or diarrhea. 2/15; the patient's wound is an inverted cone. Most of the walls of the colon are epithelialized however the tip of the conus is still open. I gather this was considered to be quite an improvement.We reapplied Puraply in the standard fashion 07/24/2021 upon  evaluation patient appears to be doing well with regard to her wound. She has been tolerating the dressing changes without complication which is great news. There is no signs of active infection at this time. Objective Constitutional Well-nourished and well-hydrated in no acute distress. Vitals Time Taken: 10:09 AM, Height: 68 in, Source: Stated, Temperature: 97.9 F, Pulse: 41 bpm, Respiratory Rate: 18 breaths/min, Blood Pressure: 134/79 mmHg. Respiratory normal breathing without difficulty. Psychiatric this patient is able to make decisions and demonstrates good insight into disease process. Alert and Oriented x 3. pleasant and cooperative. General Notes: Patient's wound bed was actually covered by some drainage and dry dressing as well as a little bit of callus buildup. I did perform debridement to clear this away. Postdebridement this appears to be doing significantly better which is great news. Overall I think that we are headed in the right direction. Integumentary (Hair, Skin) Wound #15R status is Open. Original cause of wound was Gradually Appeared. The date acquired was: 12/30/2019. The wound has been in treatment 79 weeks. The wound is located on the Left,Medial Malleolus. The wound measures 0.1cm  length x 0.1cm width x 0.1cm depth; 0.008cm^2 area and 0.001cm^3 volume. There is Fat Layer (Subcutaneous Tissue) exposed. There is no tunneling or undermining noted. There is a small amount of serous drainage noted. The wound margin is distinct with the outline attached to the wound base. There is no granulation within the wound bed. There is no necrotic tissue within the wound bed. 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 There was a Selective/Open Wound Skin/Epidermis Debridement with a total area of 0.64 sq cm performed by [name], PA. With the following instrument(s): Curette to remove Non-Viable tissue/material. Material removed includes Callus and Skin: Epidermis and after achieving pain control using Lidocaine 4% T opical Solution. No specimens were taken. A time out was conducted at 10:35, prior to the start of the procedure. A Minimum amount of bleeding was controlled with Pressure. The procedure was tolerated well with a pain level of 1 throughout and a pain level of 0 following the procedure. Post Debridement Measurements: 0.3cm length x 0.2cm width x 0.1cm depth; 0.005cm^3 volume. Character of Wound/Ulcer Post Debridement is improved. Severity of Tissue Post Debridement is: Fat layer exposed. Post procedure Diagnosis Wound #15R: Same as Pre-Procedure Pre-procedure diagnosis of Wound #15R is a Venous Leg Ulcer located on the Left,Medial Malleolus . There was a Double Layer Compression Therapy Procedure by Baruch Gouty, RN. Post procedure Diagnosis Wound #15R: Same as Pre-Procedure Plan Follow-up Appointments: Return Appointment in 1 week. Margarita Grizzle Wednesday Room 9 Cellular or Tissue Based Products: Cellular or Tissue Based Product Type: - Puraply AM #2 07/10/2021 Puraply AM for #3 07/17/2021 Puraply AM for #4 07/24/2021 Bathing/ Shower/ Hygiene: May shower with protection but do not get wound dressing(s) wet. Edema Control - Lymphedema / SCD / Other: Elevate legs to the level of the heart or above for 30 minutes daily and/or when sitting, a frequency of: - throughout the day when sitting Avoid standing for long periods of time. Patient to wear own compression stockings every day. Exercise regularly Moisturize legs daily. Non Wound  Condition: Apply the following to affected area as directed: - Apply silicone bordered foam to left lateral foot for protection WOUND #15R: - Malleolus Wound Laterality: Left, Medial Cleanser: Wound Cleanser (Generic) 1 x Per Day/15 Days Discharge Instructions: Cleanse the wound with wound cleanser prior to applying a clean dressing using gauze sponges, not tissue or cotton balls. Peri-Wound Care: Sween Lotion (Moisturizing lotion) 1 x Per Day/15 Days Discharge Instructions: Apply moisturizing lotion as directed Prim Dressing: KerraCel Ag Gelling Fiber Dressing, 2x2 in (silver alginate) 1 x Per Day/15 Days ary Discharge Instructions: Apply silver alginate to wound bed as instructed Secondary Dressing: Woven Gauze Sponge, Non-Sterile 4x4 in (Generic) 1 x Per Day/15 Days Discharge Instructions: Apply over primary dressing as directed. Secondary Dressing: ABD Pad, 8x10 1 x Per Day/15 Days Discharge Instructions: Apply over primary dressing as directed. Com pression Wrap: CoFlex TLC XL 2-layer Compression System 4x7 (in/yd) 1 x Per Day/15 Days Discharge Instructions: Apply CoFlex 2-layer compression as directed. (alt for 4 layer) 1. I would recommend that we going continue with the wound care measures as before and  the patient is in agreement with the plan. This includes the use of a silver alginate dressing for the time being which I think is good to be the best way to go. The patient is in agreement with that plan. 2. I am also can recommend that we continue with the Coflex 2 layer compression wrap. 3. We will continue to monitor for any signs of worsening but right now putting the PuraPly on hold as honestly I do not think she needs that this wound is so small. We will see patient back for reevaluation in 1 week here in the clinic. If anything worsens or changes patient will contact our office for additional recommendations. Electronic Signature(s) Signed: 07/24/2021 11:59:02 AM By: [name] PA-C Entered By: [name] on 07/24/2021 11:59:02 -------------------------------------------------------------------------------- SuperBill Details Patient Name: Date of Service: [facility] [name], [name] 07/24/2021 Medical Record Number: [id number] Patient Account Number: [id number] Date of Birth/Sex: Treating RN: 1956-03-08 (66 y.o. [name] Provider: [name] Other Clinician: Referring Provider: Treating Provider/Extender: [name] in Treatment: 59 Diagnosis Coding ICD-10 Codes Code Description [phone] Chronic venous hypertension (idiopathic) with ulcer and inflammation of left lower extremity L97.828 Non-pressure chronic ulcer of other part of left lower leg with other specified severity L97.328 Non-pressure chronic ulcer of left ankle with other specified severity Facility Procedures Physician Procedures : CPT4 Code Description Modifier [id number] - WC PHYS DEBR WO ANESTH 20 SQ CM ICD-10 Diagnosis Description L97.328 Non-pressure chronic ulcer of left ankle with other specified severity Quantity: 1 Electronic Signature(s) Signed: 07/24/2021 11:59:28 AM By: [name] PA-C Entered By: [name] on 07/24/2021 11:59:28

## 2021-07-29 NOTE — Progress Notes (Signed)
[name], [name] ([id number]) Visit Report for 07/24/2021 Arrival Information Details Patient Name: Date of Service: [facility], [name] 07/24/2021 10:00 A M Medical Record Number: [id number] Patient Account Number: [id number] Date of Birth/Sex: Treating RN: Sep 30, 1955 (66 y.o. [name] Primary Care [name]: [name] Other Clinician: Referring [name]: Treating [name]/Extender: [name] in Treatment: 37 Visit Information History Since Last Visit Added or deleted any medications: No Patient Arrived: Ambulatory Any new allergies or adverse reactions: No Arrival Time: 10:07 Had a fall or experienced change in No Accompanied By: self activities of daily living that may affect Transfer Assistance: None risk of falls: Patient Identification Verified: Yes Signs or symptoms of abuse/neglect since last visito No Secondary Verification Process Completed: Yes Hospitalized since last visit: No Patient Requires Transmission-Based Precautions: No Implantable device outside of the clinic excluding No Patient Has Alerts: Yes cellular tissue based products placed in the center Patient Alerts: L ABI =1.12, TBI = .92 since last visit: R ABI= 1.02, TBI= .58 Has Dressing in Place as Prescribed: Yes Has Compression in Place as Prescribed: Yes Pain Present Now: No Electronic Signature(s) Signed: 07/24/2021 5:17:37 PM By: [name] Gouty RN, BSN Entered By: [name] on 07/24/2021 10:09:25 -------------------------------------------------------------------------------- Compression Therapy Details Patient Name: Date of Service: [name] [name], [name] 07/24/2021 10:00 A M Medical Record Number: [id number] Patient Account Number: [id number] Date of Birth/Sex: Treating RN: June 01, 1956 (66 y.o. [name] Primary Care [name]: [name] Other Clinician: Referring [name]: Treating Zoraida Havrilla/Extender: [name] in  Treatment: 56 Compression Therapy Performed for Wound Assessment: Wound #15R Left,Medial Malleolus Performed By: Clinician [name] Gouty, RN Compression Type: Double Layer Post Procedure Diagnosis Same as Pre-procedure Electronic Signature(s) Signed: 07/24/2021 5:17:37 PM By: [name] Gouty RN, BSN Entered By: [name] on 07/24/2021 10:43:10 -------------------------------------------------------------------------------- Encounter Discharge Information Details Patient Name: Date of Service: [name] [name], [name] 07/24/2021 10:00 [place] Record Number: [id number] Patient Account Number: [id number] Date of Birth/Sex: Treating RN: 1955/07/27 (66 y.o. [name] Primary Care [name]: [name] Other Clinician: Referring [name]: Treating [name]/Extender: [name] in Treatment: 20 Encounter Discharge Information Items Post Procedure Vitals Discharge Condition: Stable Temperature (F): 97.9 Ambulatory Status: Cane Pulse (bpm): 41 Discharge Destination: Home Respiratory Rate (breaths/min): 18 Transportation: Private Auto Blood Pressure (mmHg): 134/79 Accompanied By: self Schedule Follow-up Appointment: Yes Clinical Summary of Care: Patient Declined Electronic Signature(s) Signed: 07/24/2021 5:17:37 PM By: [name] Gouty RN, BSN Entered By: [name] on 07/24/2021 10:55:45 -------------------------------------------------------------------------------- Lower Extremity Assessment Details Patient Name: Date of Service: [name] [name], [name] 07/24/2021 10:00 A M Medical Record Number: [id number] Patient Account Number: [id number] Date of Birth/Sex: Treating RN: 12-30-1955 (66 y.o. [name] Primary Care Yakira Duquette: [name] Other Clinician: Referring Aneisha Skyles: Treating [name]/Extender: [name] in Treatment: 58 Edema Assessment Assessed: [Left: No] [Right: No] Edema: [Left: N]  [Right: o] Calf Left: Right: Point of Measurement: 31 cm From Medial Instep 29 cm Ankle Left: Right: Point of Measurement: 11 cm From Medial Instep 20.8 cm Vascular Assessment Pulses: Dorsalis Pedis Palpable: [Left:Yes] Electronic Signature(s) Signed: 07/24/2021 5:17:37 PM By: [name] Gouty RN, BSN Entered By: [name] on 07/24/2021 10:16:59 -------------------------------------------------------------------------------- Multi-Disciplinary Care Plan Details Patient Name: Date of Service: [name] [name], [name] 07/24/2021 10:00 A M Medical Record Number: [id number] Patient Account Number: [id number] Date of Birth/Sex: Treating RN: Mar 13, 1956 (66 y.o. F)  [name] Primary Care [name]: [name] Other Clinician: Referring Rydan Gulyas: Treating Greyden Besecker/Extender: [name] in Treatment: 32 [name] reviewed with physician Active Inactive Venous Leg Ulcer Nursing Diagnoses: Actual venous Insuffiency (use after diagnosis is confirmed) Knowledge deficit related to disease process and management Goals: Patient will maintain optimal edema control Date Initiated: 06/20/2020 Target Resolution Date: 08/02/2021 Goal Status: Active Interventions: Assess peripheral edema status every visit. Compression as ordered Treatment Activities: Therapeutic compression applied : 06/20/2020 Notes: 01/30/21: Edema control ongoing, using CoFlex wrap. 06/05/21: Edema control continues Wound/Skin Impairment Nursing Diagnoses: Impaired tissue integrity Knowledge deficit related to ulceration/compromised skin integrity Goals: Patient/caregiver will verbalize understanding of skin care regimen Date Initiated: 01/17/2020 Target Resolution Date: 08/03/2021 Goal Status: Active Ulcer/skin breakdown will have a volume reduction of 30% by week 4 Date Initiated: 01/17/2020 Date Inactivated: 03/12/2020 Target Resolution Date: 03/09/2020 Goal Status:  Met Interventions: Assess patient/caregiver ability to obtain necessary supplies Assess patient/caregiver ability to perform ulcer/skin care regimen upon admission and as needed Assess ulceration(s) every visit Provide education on ulcer and skin care Notes: 01/30/21: Wound care regimen continues, PuraPly skin sub being used. 04/17/21: Wound healed 06/05/21: Wound re-opened, wound care regimen continues. Electronic Signature(s) Signed: 07/24/2021 5:17:37 PM By: [name] Gouty RN, BSN Entered By: [name] on 07/24/2021 10:18:45 -------------------------------------------------------------------------------- Pain Assessment Details Patient Name: Date of Service: [facility] [name], [name] 07/24/2021 10:00 A M Medical Record Number: [id number] Patient Account Number: [id number] Date of Birth/Sex: Treating RN: 1955/07/23 (66 y.o. [name] Primary Care Ikaika Showers: [name] Other Clinician: Referring Kyshawn Teal: Treating Nijah Tejera/Extender: [name] in Treatment: 45 Active Problems Location of Pain Severity and Description of Pain Patient Has Paino No Site Locations Rate the pain. Current Pain Level: 0 Pain Management and Medication Current Pain Management: Electronic Signature(s) Signed: 07/24/2021 5:17:37 PM By: [name] Gouty RN, BSN Entered By: [name] on 07/24/2021 10:10:02 -------------------------------------------------------------------------------- Patient/Caregiver Education Details Patient Name: Date of Service: [name], [name] 2/22/2023andnbsp10:00 A M Medical Record Number: [id number] Patient Account Number: [id number] Date of Birth/Gender: Treating RN: 1955/12/14 (66 y.o. [name] Primary Care Physician: [name] Other Clinician: Referring Physician: Treating Physician/Extender: [name] in Treatment: 44 Education Assessment Education Provided To: Patient Education  Topics Provided Venous: Methods: Explain/Verbal Responses: Reinforcements needed, State content correctly Wound/Skin Impairment: Methods: Explain/Verbal Responses: Reinforcements needed, State content correctly Electronic Signature(s) Signed: 07/24/2021 5:17:37 PM By: [name] Gouty RN, BSN Entered By: [name] on 07/24/2021 10:19:23 -------------------------------------------------------------------------------- Wound Assessment Details Patient Name: Date of Service: [facility] [name], [name] 07/24/2021 10:00 A M Medical Record Number: [id number] Patient Account Number: [id number] Date of Birth/Sex: Treating RN: 1956-01-21 (66 y.o. [name] Primary Care Roderick Sweezy: [name] Other Clinician: Referring Jamil Armwood: Treating [facility]/Extender: [name] in Treatment: 44 Wound Status Wound Number: 15R Primary Venous Leg Ulcer Etiology: Wound Location: Left, Medial Malleolus Wound Status: Open Wounding Event: Gradually Appeared Comorbid Peripheral Venous Disease, Osteoarthritis, Confinement Date Acquired: 12/30/2019 History: Anxiety Weeks Of Treatment: 79 Clustered Wound: No Photos Wound Measurements Length: (cm) 0.1 Width: (cm) 0.1 Depth: (cm) 0.1 Area: (cm) 0.008 Volume: (cm) 0.001 % Reduction in Area: 99.9% % Reduction in Volume: 100% Epithelialization: Large (67-100%) Tunneling: No Undermining: No Wound Description Classification: Full Thickness With Exposed Support Structures Wound Margin: Distinct, outline attached Exudate Amount: Small Exudate Type: Serous Exudate Color: amber Foul Odor After Cleansing: No Slough/Fibrino No  Wound Bed Granulation Amount: None Present (0%) Exposed Structure Necrotic Amount: None Present (0%) Fascia Exposed: No Fat Layer (Subcutaneous Tissue) Exposed: Yes Tendon Exposed: No Muscle Exposed: No Joint Exposed: No Bone Exposed: No Treatment Notes Wound #15R (Malleolus) Wound  Laterality: Left, Medial Cleanser Wound Cleanser Discharge Instruction: Cleanse the wound with wound cleanser prior to applying a clean dressing using gauze sponges, not tissue or cotton balls. Peri-Wound Care Sween Lotion (Moisturizing lotion) Discharge Instruction: Apply moisturizing lotion as directed Topical Primary Dressing KerraCel Ag Gelling Fiber Dressing, 2x2 in (silver alginate) Discharge Instruction: Apply silver alginate to wound bed as instructed Secondary Dressing Woven Gauze Sponge, Non-Sterile 4x4 in Discharge Instruction: Apply over primary dressing as directed. ABD Pad, 8x10 Discharge Instruction: Apply over primary dressing as directed. Secured With Compression Wrap CoFlex TLC XL 2-layer Compression System 4x7 (in/yd) Discharge Instruction: Apply CoFlex 2-layer compression as directed. (alt for 4 layer) Compression Stockings Add-Ons Electronic Signature(s) Signed: 07/24/2021 5:17:37 PM By: [name] Gouty RN, BSN Signed: 07/29/2021 11:32:19 AM By: Sharyn Creamer RN, BSN Entered By: Sharyn Creamer on 07/24/2021 10:22:47 -------------------------------------------------------------------------------- Vitals Details Patient Name: Date of Service: [name] [name], [name] 07/24/2021 10:00 A M Medical Record Number: [id number] Patient Account Number: [id number] Date of Birth/Sex: Treating RN: June 25, 1955 (66 y.o. [name] Primary Care Aaryn Sermon: [name] Other Clinician: Referring Romari Gasparro: Treating Cresencia Asmus/Extender: [name] in Treatment: 80 Vital Signs Time Taken: 10:09 Temperature (F): 97.9 Height (in): 68 Pulse (bpm): 41 Source: Stated Respiratory Rate (breaths/min): 18 Blood Pressure (mmHg): 134/79 Reference Range: 80 - 120 mg / dl Electronic Signature(s) Signed: 07/24/2021 5:17:37 PM By: [name] Gouty RN, BSN Entered By: [name] on 07/24/2021 10:09:52

## 2021-07-31 ENCOUNTER — Encounter (HOSPITAL_BASED_OUTPATIENT_CLINIC_OR_DEPARTMENT_OTHER): Payer: Medicare Other | Attending: Physician Assistant | Admitting: Physician Assistant

## 2021-07-31 ENCOUNTER — Other Ambulatory Visit: Payer: Self-pay

## 2021-07-31 DIAGNOSIS — L97828 Non-pressure chronic ulcer of other part of left lower leg with other specified severity: Secondary | ICD-10-CM | POA: Diagnosis not present

## 2021-07-31 DIAGNOSIS — I87332 Chronic venous hypertension (idiopathic) with ulcer and inflammation of left lower extremity: Secondary | ICD-10-CM | POA: Diagnosis present

## 2021-07-31 DIAGNOSIS — L97328 Non-pressure chronic ulcer of left ankle with other specified severity: Secondary | ICD-10-CM | POA: Insufficient documentation

## 2021-07-31 NOTE — Progress Notes (Addendum)
[name], [name] ([id number]) Visit Report for 07/31/2021 Chief Complaint Document Details Patient Name: Date of Service: [name] [name], [name] 07/31/2021 10:15 A M Medical Record Number: [id number] Patient Account Number: [id number] Date of Birth/Sex: Treating RN: Apr 13, 1956 (66 y.o. F) Primary Care Provider: [name] Other Clinician: Referring Provider: Treating Provider/Extender: [name] in Treatment: 38 Information Obtained from: Patient Chief Complaint patient is been followed long-term in this clinic for venous insufficiency ulcers with inflammation, hypertension and ulceration over the medial ankle bilaterally. 01/17/2020; this is a patient who is here for review of postoperative wounds on the left lateral ankle and recurrence of venous stasis ulceration on the left medial Electronic Signature(s) Signed: 07/31/2021 10:25:27 AM By: [name] PA-C Entered By: [name] on 07/31/2021 10:25:27 -------------------------------------------------------------------------------- Debridement Details Patient Name: Date of Service: [facility] [name], [name] 07/31/2021 10:15 A M Medical Record Number: [id number] Patient Account Number: [id number] Date of Birth/Sex: Treating RN: 04-12-1956 (66 y.o. [name], [name] Primary Care Provider: [name] Other Clinician: Referring Provider: Treating Provider/Extender: [name] in Treatment: 71 Debridement Performed for Assessment: Wound #15R Left,Medial Malleolus Performed By: Physician [name], PA Debridement Type: Debridement Severity of Tissue Pre Debridement: Limited to breakdown of skin Level of Consciousness (Pre-procedure): Awake and Alert Pre-procedure Verification/Time Out Yes - 10:25 Taken: Start Time: 10:26 Pain Control: Lidocaine 4% T opical Solution T Area Debrided (L x W): otal 0.4 (cm) x 0.4 (cm) = 0.16 (cm) Tissue and other material debrided: Non-Viable,  Skin: Dermis , Skin: Epidermis, Fibrin/Exudate Level: Skin/Epidermis Debridement Description: Selective/Open Wound Instrument: Curette Bleeding: None End Time: 10:31 Procedural Pain: 0 Post Procedural Pain: 0 Response to Treatment: Procedure was tolerated well Level of Consciousness (Post- Awake and Alert procedure): Post Debridement Measurements of Total Wound Length: (cm) 0.2 Width: (cm) 0.2 Depth: (cm) 0.1 Volume: (cm) 0.003 Character of Wound/Ulcer Post Debridement: Improved Severity of Tissue Post Debridement: Limited to breakdown of skin Post Procedure Diagnosis Same as Pre-procedure Electronic Signature(s) Signed: 07/31/2021 11:44:47 AM By: [name] PA-C Signed: 07/31/2021 4:00:28 PM By: Deon Pilling RN, BSN Entered By: Deon Pilling on 07/31/2021 10:31:27 -------------------------------------------------------------------------------- HPI Details Patient Name: Date of Service: [name], [name] 07/31/2021 10:15 A M Medical Record Number: [id number] Patient Account Number: [id number] Date of Birth/Sex: Treating RN: January 30, 1956 (65 y.o. F) Primary Care Provider: [name] Other Clinician: Referring Provider: Treating Provider/Extender: [name] in Treatment: 60 History of Present Illness HPI Description: the remaining wound is over the left medial ankle. Similar wound over the right medial ankle healed largely with use of Apligraf. Most recently we have been using Hydrofera Blue over this wound with considerable improvement. The patient has been extensively worked up in the past for her venous insufficiency and she is not a candidate for antireflux surgery although I have none of the details available currently. 08/24/14; considerable improvement today. About 50% of this wound areas now epithelialized. The base of the wound appears to be healthier granulation.as opposed to last week when she had deteriorated a considerable  improvement 08/17/14; unfortunately the wound has regressed somewhat. The areas of epithelialization from the superior aspect are not nearly as healthy as they were last week. The patient thinks her Hydrofera Blue slipped. 09/07/14; unfortunately the area has markedly regressed in the 2 weeks since I've seen this. There is an odor surrounding erythema. The healthy granulation tissue that we  had at the base of the wound now is a dusky color. The nurse reports green drainage 09/14/14; the area looks somewhat better than last week. There is less erythema and less drainage. The culture I did did not show any growth. Nevertheless I think it is better to continue the Cipro and doxycycline for a further week. The remaining wound area was debridement. 09/21/14. Wound did not require debridement last week. Still less erythema and less drainage. She can complete her antibiotics. The areas of epithelialization in the superior aspect of the wound do not look as healthy as they did some weeks ago 10/05/14 continued improvement in the condition of this wound. There is advancing epithelialization. Less aggressive debridement required 10/19/14 continued improvement in the condition and volume of this wound. Less aggressive debridement to the inferior part of this to remove surface slough and fibrinous eschar 11/02/14 no debridement is required. The surface granulation appears healthy although some of her islands of epithelialization seem to have regressed. No evidence of infection 11/16/14; lites surface debridement done of surface eschar. The wound does not look to be unhealthy. No evidence of infection. Unfortunately the patient has had podiatry issues in the right foot and for some reason has redeveloped small surface ulcerations in the medial right ankle. Her original presentation involved wounds in this area 11/23/14 no debridement. The area on the right ankle has enlarged. The left ankle wound appears stable in terms of  the surface although there is periwound inflammation. There has been regression in the amount of new skin 11/30/14 no debridement. Both wound areas appear healthy. There was no evidence of infection. The the new area on the right medial ankle has enlarged although that both the surfaces appear to be stable. 12/07/14; Debridement of the right medial ankle wound. No no debridement was done on the left. 12/14/14 no major change in and now bilateral medial ankle wounds. Both of these are very painful but the no overt evidence of infection. She has had previous venous ablation 12/21/14; patient states that her right medial ankle wound is considerably more painful last week than usual. Her left is also somewhat painful. She could not tolerate debridement. The right medial ankle wound has fibrinous surface eschar 12/28/14 this is a patient with severe bilateral venous insufficiency ulcers. For a considerable period of time we actually had the one on the right medial ankle healed however this recently opened up again in June. The left medial ankle wound has been a refractory area with some absent flows. We had some success with Hydrofera Blue on this area and it literally closed by 50% however it is recently opened up Foley. Both of these were debridement today of surface eschar. She tolerates this poorly 01/25/15: No change in the status of this. Thick adherent escar. Very poor tolerance of any attempt at debridement. I had healed the right medial malleolus wound for a considerable amount of time and had the left one down to about 50% of the volume although this is totally regressed over the last 48 weeks. Further the right leg has reopened. she is trying to make a appointment with pain and vascular, previous ablations with Dr. Aleda Grana. I do not believe there is an arterial insufficiency issue here 02/01/15 the status of the adherent eschar bilaterally is actually improved. No debridement was done. She did not  manage to get vascular studies done 02/08/15 continued debridement of the area was done today. The slough is less adherent and comes off with less  pressure. There is no surrounding infection peripheral pulses are intact 02/15/15 selective debridement with a disposable curette. Again the slough is less adherent and comes off with less difficulty. No surrounding infection peripheral pulses are intact. 02/22/15 selective debridement of the right medial ankle wound. Slough comes off with less difficulty. No obvious surrounding infection peripheral pulses are intact I did not debridement the one on the left. Both of these are stable to improved 03/01/15 selective debridement of both wound areas using a curette to. Adherent slough cup soft with less difficulty. No obvious surrounding infection. The patient tells me that 2 days ago she noted a rash above the right leg wrap. She did not have this on her lower legs when she change this over she arrives with widespread left greater than right almost folliculitis-looking rash which is extremely pruritic. I don't see anything to culture here. There is no rash on the rest of her body. She feels well systemically. 03/08/15; selective debridement of both wounds using a curette. Base of this does not look unhealthy. She had limegreen drainage coming out of the left leg wound and describes a lot of drainage. The rash on her left leg looks improved to. No cultures were done. 03/22/15; patient was not here last week. Basal wounds does not look healthy and there is no surrounding erythema. No drainage. There is still a rash on the left leg that almost looks vasculitic however it is clearly limited to the top of where the wrap would be. 04/05/15; on the right required a surgical debridement of surface eschar and necrotic subcutaneous tissue. I did not debridement the area on the left. These continue to be large open wounds that are not changing that much. We were successful at  one point in healing the area on the right, and at the same time the area on the left was roughly half the size of current measurements. I think a lot of the deterioration has to do with the prolonged time the patient is on her feet at work 04/19/15 I attempted-like surface debridement bilaterally she does not tolerate this. She tells me that she was in allergic care yesterday with extreme pain over her left lateral malleolus/ankle and was told that she has an "sprain" 05/03/15; large bilateral venous insufficiency wounds over the medial malleolus/medial aspect of her ankles. She complains of copious amounts of drainage and his usual large amounts of pain. There is some increasing erythema around the wound on the right extending into the medial aspect of her foot to. historically she came in with these wounds the right one healed and the left one came down to roughly half its current size however the right one is reopened and the left is expanded. 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 05/10/15 large bilateral venous insufficiency wounds. There is less adherence surface left however the surface culture that I did last week grew pseudomonas therefore bilateral selective debridement score necessary. There is surrounding erythema. The patient describes severe bilateral drainage and a lot of pain in the left ankle. Apparently her podiatrist was were ready to do a cortisone shot 05/17/15; the patient complains of pain and again copious amounts of drainage. 05/24/15; we used Iodo flex last week. Patient notes considerable improvement in wound drainage. Only needed to change this once. 05/31/15; we continued Iodoflex; the base of these large wounds bilaterally is not too bad but there is probably likely  a significant bioburden here. I would like to debridement just doesn't tolerate it. 06/06/14 I would like to continue the Iodoflex although she still hasn't  managed to obtain supplies. She has bilateral medial malleoli or large wounds which are mostly superficial. Both of them are covered circumferentially with some nonviable fibrinous slough although she tolerates debridement very poorly. She apparently has an appointment for an ablation on the right leg by interventional radiology. 06/14/15; the patient arrives with the wounds and static condition. We attempted a debridement although she does not do well with this secondary to pain. I 07/05/15; wounds are not much smaller however there appears to be a cleaner granulating base. The left has tight fibrinous slough greater than the right. Debridement is tolerated poorly due to pain. Iodoflex is done more for these wounds in any of the multitude of different dressings I have tried on the left 1 and then subsequently the right. 07/12/15; no change in the condition of this wound. I am able to do an aggressive debridement on the right but not the left. She simply cannot tolerate it. We have been using Iodoflex which helps somewhat. It is worthwhile remembering that at one point we healed the right medial ankle wound and the left was about 25% of the current circumference. We have suggested returning to vascular surgery for review of possible further ablations for one reason or another she has not been able to do this. 07/26/15 no major change in the condition of either wound on her medial ankle. I did not attempt to debridement of these. She has been aggressively scrubbing these while she is in the shower at home. She has her supply of Iodoflex which seems to have done more for these wounds then anything I have put on recently. 08/09/15 wound area appears larger although not verified by measurements. Using Iodoflex 09/05/2015 -- she was here for avisit today but had significant problems with the wound and I was asked to see her for a physician opinion. I have summarize that this lady has had surgery on her left lower  extremity about 10 years ago where the possible veins stripping was done. She has had an opinion from interventional radiology around November 2016 where no further sclerotherapy was ordered. The patient works 12 hours a day and stands on a concrete floor with work boots and is unable to get the proper compression she requires and cannot elevate her limbs appropriately at any given time. She has recently grown Pseudomonas from her wound culture but has not started her ciprofloxacin which was called in for her. 09/13/15 this continues to be a difficult situation for this patient. At one point I had this wound down to a 1.5 x 1.5" wound on her left leg. This is deteriorated and the right leg has reopened. She now has substantial wounds on her medial calcaneus, malleoli and into her lower leg. One on the left has surface eschar but these are far too painful for me to debridement here. She has a vascular surgery appointment next week to see if anything can be done to help here. I think she has had previous ablations several years ago at [facility]. She has no major edema. She tells me that she did not get product last time [facility]] and went for several days without it. She continues to work in work boots 12 hours a day. She cannot get compression/4-layer under her work boots. 09/20/15 no major change. Periwound edema control was not very good.  Her point with pain and vascular is next Wednesday the 25th 09/28/15; the patient is seen vascular surgery and is apparently scheduled for repeat duplex ultrasounds of her bilateral lower legs next week. 10/05/15; the patient was seen by Dr. Doren Custard of vascular surgery. He feels that she should have arterial insufficiency excluded as cause/contributed to her nonhealing stage she is therefore booked for an arteriogram. She has apparently monophasic signals in the dorsalis pedis pulses. She also of course has known severe chronic venous insufficiency with previous  procedures as noted previously. I had another long discussion with the patient today about her continuing to work 12 hour shifts. I've written her out for 2 months area had concerns about this as her work location is currently undergoing significant turmoil and this may lead to her termination. She is aware of this however I agree with her that she simply cannot continue to stand for 12 hours multiple days a week with the substantial wound areas she has. 10/19/15; the Dr. Doren Custard appointment was largely for an arteriogram which was normal. She does not have an arterial issue. He didn't make a comment about her chronic venous insufficiency for which she has had previous ablations. Presumably it was not felt that anything additional could be done. The patient is now out of work as I prescribed 2 weeks ago. Her wounds look somewhat less aggravated presumably because of this. I felt I would give debridement another try today 10/25/15; no major change in this patient's wounds. We are struggling to get her product that she can afford into her own home through her insurance. 11/01/15; no major change in the patient's wounds. I have been using silver alginate as the most affordable product. I spoke to Dr. Marla Roe last week with her requested take her to the OR for surgical debridement and placement of ACEL. Dr. Marla Roe told me that she would be willing to do this however [facility] will not cover this, fortunately the patient has [place] healthcare of some variant 11/08/15; no major change in the patient's wounds. She has been completely nonviable surface that this but is in too much pain with any attempted debridement are clinic. I have arranged for her to see Dr. Marla Roe ham of plastic surgery and this appointment is on Monday. I am hopeful that they will take her to the OR for debridement, possible ACEL ultimately possible skin graft 11/22/15 no major change in the patient's wounds over her  bilateral medial calcaneus medial malleolus into the lower legs. Surface on these does not look too bad however on the left there is surrounding erythema and tenderness. This may be cellulitis or could him sleepy tinea. 11/29/15; no major changes in the patient's wounds over her bilateral medial malleolus. There is no infection here and I don't think any additional antibiotics are necessary. There is now plan to move forward. She sees Dr. Marla Roe in a week's time for preparation for operative debridement and ACEL placement I believe on 7/12. She then has a follow-up appointment with Dr. Marla Roe on 7/21 12/28/15; the patient returns today having been taken to the [place] by Dr. Marla Roe 12/12/15 she underwent debridement, intraoperative cultures [which were negative]. She had placement of a wound VAC. Parent really ACEL was not available to be placed. The wound VAC foam apparently adhered to the wound since then she's been using silver alginate, Xeroform under Ace wraps. She still says there is a lot of drainage and a lot of pain 01/31/16; this is  a patient I see monthly. I had referred her to Dr. Marla Roe him of plastic surgery for large wounds on her bilateral medial ankles. She has been to the OR twice once in early July and once in early August. She tells me over the last 3 weeks she has been using the wound VAC with ACEL underneath it. On the right we've simply been using silver alginate. Under Kerlix Coban wraps. 02/28/16; this is a patient I'm currently seeing monthly. She is gone on to have a skin graft over her large venous insufficiency ulcer on the left medial ankle. This was done by Dr. Marla Roe him. The patient is a bit perturbed about why she didn't have one on her right medial ankle wound. She has been using silver alginate to this. 03/06/16; I received a phone call from her plastic surgery Dr. Marla Roe. She expressed some concern about the viability of the skin graft she did on the  left medial ankle wound. Asked me to place Endoform on this. She told me she is not planning to do a subsequent skin graft on the right as the left one did not take very well. I had placed Hydrofera Blue on the right 03/13/16; continue to have a reasonably healthy wound on the right medial ankle. Down to 3 mm in terms of size. There is epithelialization here. The area on the left medial ankle is her skin graft site. I suppose the last week this looks somewhat better. She has an open area inferiorly however in the center there appears to be some viable tissue. There is a lot of surface callus and eschar that will eventually need to come off however none of this looked to be infected. Patient states that the is able to keep the dressing on for several days which is an improvement. 03/20/16 no major change in the circumference of either wound however on the left side the patient was at Dr. Eusebio Friendly office and they did a debridement of left wound. 50% of the wound seems to be epithelialized. I been using Endoform on the left Hydrofera Blue in the right 03/27/16; she arrives today with her wound is not looking as healthy as they did last week. The area on the right clearly has an adherent surface to this a very similar surface on the left. Unfortunately for this patient this is all too familiar problem. Clearly the Endoform is not working and will need to change that today that has some potential to help this surface. She does not tolerate debridement in this clinic very well. She is changing the dressing wants 04/03/16; patient arrives with the wounds looking somewhat better especially on the right. Dr. Migdalia Dk change the dressing to silver alginate when she saw her on Monday and also sold her some compression socks. The usefulness of the latter is really not clear and woman with severely draining wounds. 04/10/16; the patient is doing a bit of an experiment wearing the compression stockings that Dr.  Migdalia Dk provided her to her left leg and the out of legs based dressings that we provided to the right. 05/01/16; the patient is continuing to wear compression stockings Dr. Migdalia Dk provided her on the left that are apparently silver impregnated. She has been using Iodoflex to the right leg wound. Still a moderate amount of drainage, when she leaves here the wraps only last for 4 days. She has to change the stocking on the left leg every night 05/15/16; she is now using compression stockings bilaterally provided by  Dr. Marla Roe. She is wearing a nonadherent layer over the wounds so really I don't think there is anything specific being done to this now. She has some reduction on the left wound. The right is stable. I think all healing here is being done without a specific dressing 06/09/16; patient arrives here today with not much change in the wound certainly in diameter to large circular wounds over the medial aspect of her ankle bilaterally. Under the light of these services are certainly not viable for healing. There is no evidence of surrounding infection. She is wearing compression stockings with some sort of silver impregnation as prescribed by Dr. Marla Roe. She has a follow-up with her tomorrow. 06/30/16; no major change in the size or condition of her wounds. These are still probably covered with a nonviable surface. She is using only her purchase stockings. She did see Dr. Marla Roe who seemed to want to apply Dakin's solution to this I'm not extreme short what value this would be. I would suggest Iodoflex which she still has at home. 07/28/16; I follow Mrs. Irion episodically along with Dr. Marla Roe. 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 She has been applying a topical collagen ointment to both wounds with Adaptic. I don't think Dr. Marla Roe is planning to take her back to the OR. 08/19/16; I follow Mrs. Jeneen Rinks on a  monthly basis along with Dr. Marla Roe of plastic surgery. 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 I been following her for a number of years. At one point I was able to get the right medial malleolus wound to heal and had the left medial malleolus down to about half its current size however and I had to send her to plastic surgery for an operative debridement. Since then things have been stable to slightly improve the area on the right is slightly better one in the left about the same although there is much less adherent surface than I'm used to with this patient. She is using some form of liquid collagen gel that Dr. Marla Roe provided a Kerlix cover with the patient's own pressure stockings. She tells me that she has extreme pain in both ankles and along the lateral aspect of both feet. She has been unable to work for some period of time. She is telling me she is retiring at the beginning of April. She sees Dr. Doran Durand of orthopedics next week 09/22/16; patient has not seen Dr. Marla Roe since the last time she is here. I'm not really sure what she is using to the wounds other than bits and pieces of think she had left over including most recently Hydrofera Blue. She is using juxtalite stockings. She is having difficulty with her husband's recent illness "stroke". She is having to transport him to various doctors appointments. Dr. Marla Roe left her the option of a repeat debridement with ACEL however she has not been able to get the time to follow-up on this. She continues to have a fair amount of drainage out of these wounds with certainly precludes leaving dressings on all week 10/13/16; patient has not seen Dr. Marla Roe since she was last in our clinic. I'm not really sure what she is doing with the wounds, we did try to get her [facility] and I think she is actually using this most of the time. Because of drainage she  states she has to change this every second day  although this is an improvement from what she used to do. She went to see Dr. Doran Durand who did not think she had a muscular issue with regards to her feet, he referred her to a neurologist and I think the appointment is sometime in June. I changed her back to Iodoflex which she has used in the past but not recently. 11/03/16; the patient has been using Iodoflex although she ran out of this. Still claims that there is a lot of drainage although the wound does not look like this. No surrounding erythema. She has not been back to see Dr. Marla Roe 11/24/16; the patient has been using Iodoflex again but she ran out of it 2 or 3 days ago. There is no major change in the condition of either one of these wounds in fact they are larger and covered in a thick adherent surface slough/nonviable tissue especially on the left. She does not tolerate mechanical debridement in our clinic. Going back to see Dr. Marla Roe of plastic surgery for an operative debridement would seem reasonable. 12/15/16; the patient has not been back to see Dr. Marla Roe. She is been dealing with a series of illnesses and her husband which of monopolized her time. She is been using Sorbact which we largely supplied. She states the drainage is bad enough that it maximum she can go 2-3 days without changing the dressing 01/12/2017 -- the patient has not been back for about 4 weeks and has not seen Dr. Marla Roe not does she have any appointment pending. 01/23/17; patient has not seen Dr. Marla Roe even though I suggested this previously. She is using Santyl that was suggested last week by Dr. Con Memos this Cost her $16 through her insurance which is indeed surprising 02/12/17; continuing Santyl and the patient is changing this daily. A lot of drainage. She has not been back to see plastic surgery she is using an Ace wrap. Our intake nurse suggested wrap around stockings which would make a good  reasonable alternative 02/26/17; patient is been using Santyl and changing this daily due to drainage. She has not been to see plastic surgery she uses in April Ace wrap to control the edema. She did obtain extremitease stockings but stated that the edema in her leg was to big for these 03/20/17; patient is using Santyl and Anasept. Surfaces looked better today the area on the right is actually measuring a little smaller. She has states she has a lot of pain in her feet and ankles and is asking for a consult to pain control which I'll try to help her with through our case manager. 04/10/17; the patient arrives with better-looking wound surfaces and is slightly smaller wound on the left she is using a combination of Santyl and Anasept. She has an appointment or at least as started in the pain control center [facility] 05/14/17; this is a patient who I followed for a prolonged period of time. She has venous insufficiency ulcers on her bilateral medial ankles. At one point I had this down to a much smaller wound on the left however these reopened and we've never been able to get these to heal. She has been using Santyl and Anasept gel although 2 weeks ago she ran out of the Anasept gel. She has a stable appearance of the wound. She is going to the wound care clinic at [facility]. They wanted do a nerve block/spinal block although she tells me she is reluctant to go forward with that. 05/21/17; this  is a patient I have followed for many years. She has venous insufficiency ulcers on her bilateral medial ankles. Chronic pain and deformity in her ankles as well. She is been to see plastic surgery as well as orthopedics. Using PolyMem AG most recently/Kerramax/ABDs and 2 layer compression. She has managed to keep this on and she is coming in for a nurse check to change the dressing on Tuesdays, we see her on Fridays 06/05/17; really quite a good looking surface and the area especially  on the right medial has contracted in terms of dimensions. Well granulated healthy-looking tissue on both sides. Even with an open curet there is nothing that even feels abnormal here. This is as good as I've seen this in quite some time. We have been using PolyMem AG and bringing her in for a nurse check 06/12/17; really quite good surface on both of these wounds. The right medial has contracted a bit left is not. We've been using PolyMem and AG and she is coming in for a nurse visit 06/19/17; we have been using PolyMem AG and bringing her in for a nurse check. Dimensions of her wounds are not better but the surfaces looked better bilaterally. She complained of bleeding last night and the left wound and increasing pain bilaterally. She states her wound pain is more neuropathic than just the wounds. There was some suggestion that this was radicular from her pain management doctor in talking to her it is really difficult to sort this out. 06/26/17; using PolyMem and AG and bringing her in for a nurse check as All of this and reasonably stable condition. Certainly not improved. The dimensions on the lateral part of the right leg look better but not really measuring better. The medial aspect on the left is about the same. 07/03/16; we have been using PolyMen AG and bringing her in for a nurse check to change the dressings as the wounds have drainage which precludes once weekly changing. We are using all secondary absorptive dressings.our intake nurse is brought up the idea of using a wound VAC/snap VAC on the wound to help with the drainage to see if this would result in some contraction. This is not a bad idea. The area on the right medial is actually looking smaller. Both wounds have a reasonable-looking surface. There is no evidence of cellulitis. The edema is well controlled 07/10/17; the patient was denied for a snap VAC by her insurance. The major issue with these wounds continues to be drainage. We are  using wicked PolyMem AG and she is coming in for a nurse visit to change this. The wounds are stable to slightly improved. The surface looks vibrant and the area on the right certainly has shrunk in size but very slowly 07/17/17; the patient still has large wounds on her bilateral medial malleoli. Surface of both of these wounds looks better. The dimensions seem to come and go but no consistent improvement. There is no epithelialization. We do not have options for advanced treatment products due to insurance issues. They did not approve of the wound VAC to help control the drainage. More recently we've been using PolyMem and AG wicked to allow drainage through. We have been bringing her in for a nurse visit to change this. We do not have a lot of options for wound care products and the home again due to insurance issues 07/24/17; the patient's wound actually looks somewhat better today. No drainage measurements are smaller still healthy-looking surface. We used silver  collagen under PolyMen started last week. We have been bringing her in for a dressing change 07/31/17; patient's wound surface continued to look better and I think there is visible change in the dimensions of the wound on the right. Rims of epithelialization. We have been using silver collagen under PolyMen and bringing her in for a dressing change. There appears to be less drainage although she is still in need of the dressing change 08/07/17. Patient's wound surface continues to look better on both sides and the area on the right is definitely smaller. We have been using silver collagen and PolyMen. She feels that the drainage has been it has been better. I asked her about her vascular status. She went to see Dr. Aleda Grana at [facility] and had some form of ablation. I don't have much detail on this. I haven't my notes from 2016 that she was not a candidate for any further ablation but I don't have any more information on this. We had  referred her to vein and vascular I don't think she ever went. He does not have a history of PAD although I don't have any information on this either. We don't even have ABIs in our record 08/14/17; we've been using silver collagen and PolyMen cover. And putting the patient and compression. She we are bringing her in as a nurse visit to change this because ofarge amount of drainage. We didn't the ABIs in clinic today since they had been done in many moons 1.2 bilaterally. She has been to see vein and vascular however this was at [facility] and she had ablation although I really don't have any information on this all seemed biking get a report. She is also been operatively debrided by plastic surgery and had a cell placed probably 8-12 months ago. This didn't have a major effect. We've been making some gains with current dressings 08/19/17-She is here in follow-up evaluation for bilateral medial malleoli ulcers. She continues to tolerate debridement very poorly. We will continue with recently changed topical treatment; if no significant improvement may consider switching to Iodosorb/Iodoflex. 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 She has been using silver collagen and PolyMem. I believe she has been used and tried on Iodoflex before. 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 These have been very refractory over time. We have been using silver collagen and PolyMen. She is coming in once a week for a doctor's and once a week for nurse visits. We are actually making some progress 09/18/17; the patient's wounds are smaller especially on the right medial. She arrives today to upset to consider even washing these off with Anasept which I think is been part of the  reason this is been closing. We've been using collagen covered in PolyMen otherwise. It is noted that she has a small area of folliculitis on the right medial calf that. As we are wrapping her legs I'll give her a short course of doxycycline to make sure this doesn't amount to anything. She is a long list of complaints today including imbalance, shortness of breath on exertion, inversion of her left ankle. With regards to the latter complaints she is been to see orthopedics and they offered her a tendon release surgery I believe but wanted her wounds to be  closed first. I have recommended she go see her primary physician with regards to everything else. 09/25/17; patient's wounds are about the same size. We have made some progress bilaterally although not in recent weeks. She will not allow me T wash these wounds with Anasept even if she is doing her cell. Wheeze we've been using collagen covered in PolyMen. Last week she had a small area of folliculitis this is now opened into a small wound. She completed 5 days of trimethoprim sulfamethoxazole 10/02/17; unfortunately the area on her left medial ankle is worse with a larger wound area towards the Achilles. The patient complains of a lot of pain. She will not allow debridement although visually I don't think there is anything to debridement in any case. We have been using silver collagen and PolyMen for several months now. Initially we are making some progress although I'm not really seeing that today. We will move back to [facility]. His admittedly this is a bit of a repeat however I'm hoping that his situation is different now. The patient tells me she had her leg on the left give out on her yesterday this is process some pain. 10/09/17; the patient is seen twice a week largely because of drainage issues coming out of the chronic medial bimalleolar wounds that are chronic. Last week the dimensions of the one on the left looks a little larger I  changed her to [facility]. She comes in today with a history of terrible pain in the bilateral wound areas. She will not allow debridement. She will not even allow a tissue culture. There is no surrounding erythema no no evidence of cellulitis. We have been putting her Kerlix Coban man. She will not allow more aggressive compression as there was a suggestion to put her in 3 layer wraps. 10/16/17; large wounds on her bilateral medial malleoli. These are chronic. Not much change from last week. The surface looks have healthy but absolutely no epithelialization. A lot of pain little less so of drainage. She will not allow debridement or even washing these off in the vigorous fashion with Anasept. 10/23/17; large wounds on her bilateral malleoli which are chronic. Some improvement in terms of size perhaps on the right since last time I saw these. She states that after we increased the 3 layer compression there was some bleeding, when she came in for a nurse visit she did not want 3 layer compression put back on about our nurse managed to convince her. She has known chronic venous visit issues and I'm hoping to get her to tolerate the 3 layer compression. using Hydrofera Blue 10/30/17; absolutely no change in the condition of either wound although we've had some improvement in dimensions on the right.. Attempted to put her in 3 layer compression she didn't tolerated she is back in 2 layer compression. We've been using Hydrofera Blue We looked over her past records. She had venous reflux studies in November 2016. There was no evidence of deep venous reflux on the right. Superficial vein did not show the greater saphenous vein at think this is been previously ablated the small saphenous vein was within normal limits. The left deep venous system showed no DVT the vessels were positive for deep venous reflux in the posterior tibial veins at the ankle. The greater saphenous vein was surgically absent small  saphenous vein was within normal limits. She went to vein and vascular at [facility]. I believe she had an ablation on the left greater saphenous  vein. I'll update her reflux studies perhaps ever reviewed by vein and vascular. We've made absolutely no progress in these wounds. Will also try to read and TheraSkins through her insurance 11/06/17; W the patient apparently has a 2 week follow-up with vein and vascular I like him to review the whole issue with regards to her previous vascular workup by Dr. Aleda Grana. We've really made no progress on these wounds in many months. She arrives today with less viable looking surface on the left medial ankle wound. This was apparently looking about the same on Tuesday when she was here for nurse visit. 11/13/17; deep tissue culture I did last time of the left lower leg showed multiple organisms without any predominating. In particular no Staphylococcus or group A strep were isolated. We sent her for venous reflux studies. She's had a previous left greater saphenous vein stripping and I think sclerotherapy of the right greater saphenous vein. She didn't really look at the lesser saphenous vein this both wounds are on the medial aspect. She has reflux in the common femoral vein and popliteal vein and an accessory vein on the right and the common femoral vein and popliteal vein on the left. I'm going to have her go to see vein and vascular just the look over things and see if anything else beside aggressive compression is indicated here. We have not been able to make any progress on these wounds in spite of the fact that the surface of the wounds is never look too bad. 11/20/17; no major change in the condition of the wounds. Patient reports a large amount of drainage. She has a lot of complaints of pain although enlisting her today I wonder if some of this at least his neuropathic rather than secondary to her wounds. She has an appointment with vein and vascular  on 12/30/17. The refractory nature of these wounds in my mind at least need vein and vascular to look over the wounds the recent reflux studies we did and her history to see if anything further can be done here. I also note her gait is deteriorated quite a bit. 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 She has a bilateral Trendelenburg gait. I wonder if this is neuropathic or perhaps multilevel radicular. 11/27/17; her wounds actually looks slightly better. Healthy-looking granulation tissue a scant amount of epithelialization. [place] healthcare will not pay for [employer]. They will play for tri layer Oasis and Dermagraft. This is not a diabetic ulcer. We'll try for the tri layer Oasis. She still complains of some drainage. She has a vein and vascular appointment on 12/30/17 12/04/17; the wounds visually look quite good. Healthy-looking granulation with some degree of epithelialization. We are still waiting for response to our request for trial to try layer Oasis. Her appointment with vascular to review venous and arterial issues isn't sold the end of July 7/31. Not allow debridement or even vigorous cleansing of the wound surface. 12/18/17; slightly smaller especially on the right. 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 We've been using Hydrofera Blue. We still are looking into triple layer Oasis through her insurance 01/08/18 on evaluation today patient's wound actually appears to be showing signs of good improvement at this point in time. She has been tolerating the dressing changes without complication. Fortunately there does not appear to be any evidence of infection at this point in time. We have been utilizing silver nitrate which does seem to be of benefit for her which is  also good news. Overall I'm very happy with how things seem to be both regards appearance as well as measurement. Patient did see Dr. Bridgett Larsson for evaluation on 12/30/17. In his assessment he  felt that stripping would not likely add much more than chronic compression to the patient's healing process. His recommendation was to follow-up in three months with Dr. Doren Custard if she hasn't healed in order to consider referral back to you and see vascular where she previously was in a trial and was able to get her wound to heal. I'll be see what she feels she when you staying compression and he reiterated this as well. 01/13/18 on evaluation today patient appears to actually be doing very well in regard to her bilateral medial malleolus ulcers. She seems to have tolerated the chemical cauterization with silver nitrate last week she did have some pain through that evening but fortunately states that I'll be see since it seems to be doing better she is overall pleased with the progress. 01/21/18; really quite a remarkable improvement since I've last seen these wounds. We started using silver nitrate specially on the islands of hyper granulation which for some reason her around the wound circumference. This is really done quite nicely. Primary dressing Hydrofera Blue under 4 layer compression. She seems to be able to hold out without a nurse rewrap. Follow-up in 1 week 01/28/18; we've continued the hydrofera blue but continued with chemical cauterization to the wound area that we started about a month ago for irregular hyper granulation. She is made almost stunning improvement in the overall wound dimensions. I was not really expecting this degree of improvement in these chronic wounds 02/05/18; we continue with Hydrofera Bluebut of also continued the aggressive chemical cauterization with silver nitrate. We made nice progress with the right greater than left wound. 02/12/18. We continued with Hydrofera Blue after aggressive chemical cauterization with silver nitrate. We appear to be making nice progress with both wound areas 02/19/2018; we continue with [facility] after washing the wounds vigorously  with Anasept spray and chemical cauterization with silver nitrate. We are making excellent progress. The area on the right's just about closed 02/26/2018. The area on the left medial ankle had too much necrotic debris today. I used a #5 curette we are able to get most of the soft. I continued with the silver nitrate to the much smaller wound on the right medial ankle she had a new area on her right lower pretibial area which she says was due to a role in her compression 03/05/2018; both wound areas look healthy. Not much change in dimensions from last week. I continue to use silver nitrate and Hydrofera Blue. The patient saw Dr. Doren Custard of vein and vascular. He felt she had venous stasis ulcers. He felt based on her previous arteriogram she should have adequate circulation for healing. Also she has deep venous reflux but really no significant correctable superficial venous reflux at this time. He felt we should continue with conservative management including leg elevation and compression 04/02/2018; since we last saw this woman about a month ago she had a fall apparently suffered a pelvic fracture. I did not look up the x-ray. Nevertheless because of pain she literally was bedbound for 2 weeks and had home health coming out to change the dressing. Somewhat predictably this is resulted in considerable improvement in both wound areas. The right is just about closed on the medial malleolus and the left is about half the size. 04/16/2018; both her  wounds continue to go down in size. Using Hydrofera Blue. 05/07/18; both her wounds appeared to be improving especially on the right where it is almost closed. We are using Hydrofera Blue 05/14/2018; slightly worse this week with larger wounds. Surface on the left medial not quite as good. We have been using Hydrofera Blue 05/21/18; again the wounds are slightly larger. Left medial malleolus slightly larger with eschar around the circumference. We have been using  Hydrofera Blue undergoing a wraps for a prolonged period of time. This got a lot better when she was more recumbent due to a fall and a back injury. I change the primary dressing the silver alginate today. She did not tolerate a 4 layer compression previously although I may need to bring this up with her next time 05/28/2018; area on the left medial malleolus again is slightly larger with more drainage. Area on the right is roughly unchanged. She has a small area of folliculitis on the right medial just on the lower calf. This does not look ominous. 06/03/2018 left medial malleolus slightly smaller in a better looking surface. We used silver nitrate on this last time with silver alginate. The area on the right appears slightly smaller 1/10; left medial malleolus slightly smaller. Small open area on the right. We used silver nitrate and silver alginate as of 2 weeks ago. We continue with the wound and compression. These got a lot better when she was off her feet 1/17; right medial malleolus wound is smaller. The left may be slightly smaller. Both surfaces look somewhat better. 1/24; both wounds are slightly smaller. Using silver alginate under Unna boots 1/31; both wounds appear smaller in fact the area on the right medial is just about closed. Surface eschar. We have been using silver alginate under Unna boots. The patient is less active now spends let much less time on her feet and I think this is contributed to the general improvement in the wound condition 2/7; both wounds appear smaller. I was hopeful the right medial would be closed however there there is still the same small open area. Slight amount of surface eschar on the left the dimensions are smaller there is eschar but the wound edges appear to be free. We have been using silver alginate under Unna boot's 2/14; both wounds once again measure smaller. Circumferential eschar on the left medial. We have been using silver alginate under Unna  boots with gradual improvement 2/21; the area on the right medial malleolus has healed. The area on the left is smaller. We have been using silver alginate and Unna boots. We can discharge wrapping the right leg she has 20/30 stockings at home she will need to protect the scar tissue in this area 2/28; the area on the right medial malleolus remains closed the patient has a compression stocking. The area on the left is smaller. We have been using silver alginate and Unna boots. 3/6 the area on the right medial ankle remains closed. Good edema control noted she is using her own compression stocking. The area on the left medial ankle is smaller. We have been managing this with silver alginate and Unna boots which we will continue today. 3/13; the area on the right medial ankle remains closed and I'm declaring it healed today. When necessary the left is about the same still a healthy-looking surface but no major change and wound area. No evidence of infection and using silver alginate under unna and generally making considerable improvement 3/27 the area on  the right medial ankle remains closed the area on the left is about the same as last week. Certainly not any worse we have been using silver alginate under an Unna boot 4/3; the area on the right medial ankle remains closed per the patient. We did not look at this wound. The wound on the left medial ankle is about the same surface looks healthy we have been using silver alginate under an Unna boot 4/10; area on the right medial ankle remains closed per the patient. We did not look at this wound. The wound on the left medial ankle is slightly larger. The patient complains that the [facility] caused burning pain all week. She also told us that she was a lot more active this week. Changed her back to silver alginate 4/17; right medial ankle still closed per the patient. Left medial ankle is slightly larger. Using silver alginate. She did not  tolerate Hydrofera Blue on this area 4/24; right medial ankle remains closed we have not look at this. The left medial ankle continues to get larger today by about a centimeter. We have been using silver alginate under Unna boots. She complains about 4 layer compression as an alternative. She has been up on her feet working on her garden 5/8; right medial ankle remains closed we did not look at this. The left medial ankle has increased in size about 100%. We have been using silver alginate under Unna boots. She noted increased pain this week and was not surprised that the wound is deteriorated 5/15; no major change in SA however much less erythema ( one week of doxy ocellulitis). 5/22-66 year old female returns at 1 week to the clinic for left medial ankle wound for which we have been using silver alginate under 3 layer compression She was placed on DOXY at last visit - the wound is wider at this visit. She is in 3 layer compression 5/29; change to [facility] last week. I had given her empiric doxycycline 2 weeks ago for a week. She is in 3 layer compression. She complains of a lot of pain and drainage on presentation today. 6/5; using Hydrofera Blue. I gave her doxycycline recently empirically for erythema and pain around the wound. Believe her cultures showed enterococcus which not would not have been well covered by doxycycline nevertheless the wound looks better and I don't feel specifically that the enterococcus needs to be covered. She has a new what looks like a wrap injury on her lateral left ankle. 6/12; she is using Hydrofera Blue. She has a new area on the left anterior lower tibial area. This was a wrap injury last week. 6/19; the patient is using Hydrofera Blue. She arrived with marked inflammation and erythema around the wound and tenderness. 12/01/18 on evaluation today patient appears to be doing a little bit better based on what I'm hearing from the standpoint of lassos evaluation  to this as far as the overall appearance of the wound is concerned. Then sometime substandard she typically sees Dr. Dellia Nims. Nonetheless overall very pleased with the progress that she's made up to this point. No fevers, chills, nausea, or vomiting noted at this time. 7/10; some improvement in the surface area. Aggressively debrided last week apparently. I went ahead with the debridement today although the patient does not tolerate this very well. We have been using Iodoflex. Still a fair amount of drainage 7/17; slightly smaller. Using Iodoflex. 7/24; no change from last week in terms of surface area. We have  been using Iodoflex. Surface looks and continues to look somewhat better 7/31; surface area slightly smaller better looking surface. We have been using Iodoflex. This is under Unna boot compression 8/7-Patient presents at 1 week with Unna boot and Iodoflex, wound appears better 8/14-Patient presents at 1 week with Iodoflex, we use the Unna boot, wound appears to be stable better.Patient is getting Botox treatment for the inversion of the foot for tendon release, Next week 8/21; we are using Iodoflex. Unna boot. The wound is stable in terms of surface area. Under illumination there is some areas of the wound that appear to be either epithelialized or perhaps this is adherent slough at this point I was not really clear. It did not wipe off and I was reluctant to debride this today. 8/28; we are using Iodoflex in an Unna boot. Seems to be making good improvement. 9/4; using Iodoflex and wound is slightly smaller. 9/18; we are using Iodoflex with topical silver nitrate when she is here. The wound continues to be smaller 10/2; patient missed her appointment last week due to GI issues. She left and Iodoflex based dressing on for 2 weeks. Wound is about the same size about the size of a dime on the left medial lower 10/9 we have been using Iodoflex on the medial left ankle wound. She has a new  superficial probable wrap injury on the dorsal left ankle 10/16; we have been using Hydrofera Blue since last week. This is on the left medial ankle 10/23; we have been using Hydrofera Blue since 2 weeks ago. This is on the left medial ankle. Dimensions are better 11/6; using Hydrofera Blue. I think the wound is smaller but still not closed. Left medial ankle 11/13; we have been using Hydrofera Blue. Wound is certainly no smaller this week. Also the surface not as good. This is the remanent of a very large area on her left medial ankle. 11/20; using Sorbact since last week. Wound was about the same in terms of size although I was disappointed about the surface debris 12/11; 3-week follow-up. Patient was on vacation. Wound is measuring slightly larger we have been using Sorbact. 12/18; wound is about the same size however surface looks better last week after debridement. We have been using Sorbact under compression 1/15 wound is probably twice the size of last time increased in length nonviable surface. We have been using Sorbact. She was running a mild fever and missed her appointment last week 1/22; the wound is come down in size but under illumination still a very adherent debris we have been Hydrofera Blue that I changed her to last week 1/29; dimensions down slightly. We have been using Hydrofera Blue 2/19 dimensions are the same however there is rims of epithelialization under illumination. Therefore more the surface area may be epithelialized 2/26; the patient's wound actually measures smaller. The wound looks healthy. We have been using Hydrofera Blue. I had some thoughts about running Apligraf then I still may do that however this looks so much better this week we will delay that for now 3/5; the wound is small but about the same as last week. We have been using Hydrofera Blue. No debridement is required today. 3/19; the wound is about the size of a dime. Healthy looking wound even under  illumination. We have been using Hydrofera Blue. No mechanical debridement is necessary 3/26; not much change from last week although still looks very healthy. We have been using Hydrofera Blue under Unna boots Patient was  offered an ankle fusion by podiatry but not until the wound heals with a proceed with this. 4/9; the patient comes in today with her original wound on the medial ankle looking satisfactory however she has some uncontrolled swelling in the middle part of her leg with 2 new open areas superiorly just lateral to the tibia. I think this was probably a wrap issue. She said she felt uncomfortable during the week but did not call in. We have been using Hydrofera Blue 4/16; the wound on the medial ankle is about the same. She has innumerable small areas superior to this across her mid tibia. I think this is probably folliculitis. She is also been working in the yard doing a lot of sweating 4/30; the patient issue on the upper areas across her mid tibia of all healed. I think this was excessive yard work if I remember. Her wound on the medial ankle is smaller. Some debris on this we have been using Hydrofera Blue under Unna boots 5/7; mid tibia. She has been using Hydrofera Blue under an Unna wrap. She is apparently going for her ankle surgery on June 3 10/28/19-Patient returns to clinic with the ankle wound, we are using Hydrofera Blue under Unna wrap, surgery is scheduled for her left foot for June 3 so she will be back for nurse visit next week READMISSION 01/17/2020 Mrs. Halbur is a 66 year old woman we have had in this clinic for a long period of time with severe venous hypertension and refractory wounds on her medial lower legs and ankles bilaterally. 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 When she was off her legs for a prolonged period example when she fell  and suffered a compression fracture things would heal up quite nicely. She is now retired and we managed to heal up the right medial leg wound. The left one was very tiny last time I saw this although still refractory. 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 It got to the point where this was interfering with her walking and she elected to proceed with a ankle arthrodesis to straighten her her ankle and leave her with a functional outcome for mobilization. The patient was referred to Dr. Doren Custard and really this took some time to arrange. Dr. Doren Custard saw her on 12/07/2019. Once again he verified that she had no arterial issues. She had previously had an angiogram several years ago. Follow-up ABIs on the left showed an ABI of 1.12 with triphasic waveforms and a TBI of 0.92. She is felt to have chronic deep venous insufficiency but I do not think it was felt that anything could be done from about this from an ablation point of view. At the time Dr. Doren Custard saw this patient the wounds actually look closed via the pictures in his clinic. The patient finally underwent her surgery on 12/15/2019. This went reasonably well and there was a good anatomic outcome. She developed a small distal wound dehiscence on the lateral part of the surgical wound. However more problematically she is developed recurrence of the wound on the medial left ankle. There are actually 2 wounds here one in the distal lower leg and 1 pretty much at the level of the medial malleolus. It is a more distal area that is more problematic. She  has been using Hydrofera Blue which started on Friday before this she was simply Ace wrapping. There was a culture done that showed Pseudomonas and she is on ciprofloxacin. A recent CNS on 8/11 was negative. The patient reports some pain but I generally think this is improving. She is using a cam boot completely  nonweightbearing using a walker for pivot transfers and a wheelchair 8/24; not much improvement unfortunately she has a surgical wound on the lateral part in the venous insufficiency wound medially. The bottom part of the medial insufficiency wound is still necrotic there is exposed tendon here. We have been using Hydrofera Blue under compression. Her edema control is however better 8/31; patient in for follow-up of his surgical wound on the lateral part of her left leg and chronic venous insufficiency ulcers medially. We put her back in compression last week. She comes in today with a complaint of 3 or 4 days worth of increasing pain. She felt her cam walker was rubbing on the area on the back of her heel. However there is intense erythema seems more likely she has cellulitis. She had 2 cultures done when she was seeing podiatry in the postop. One of them in late July showed Pseudomonas and she received a course of ciprofloxacin the other was negative on 8/11 she is allergic to penicillin with anaphylactoid complaints of hives oral swelling via information in epic 9/9; when I saw this patient last week she had intense anterior erythema around her wound on the right lateral heel and ankle and also into the right medial heel. Some of this was no doubt drainage and her walker boot however I was convinced she had cellulitis. I gave her Levaquin and Bactrim she is finishing up on this now. She is following up with Dr. [name] he saw her yesterday. He is taken her out of the walking boot of course she is still nonweightbearing. Her x-ray was negative for any worrisome features such as soft tissue air etc. Things are a lot better this week. She has home health. We have been using Hydrofera Blue under an [employer] which she put back on yesterday. I did not wrap her last week 9/17; her surrounding skin looks a lot better. In fact the area on the left lateral ankle has just a scant amount of eschar. The only  remaining wound is the large area on the left medial ankle. Probably about 60% of this is healthy granulation at the surface however she has a significant divot distally. This has adherent debris in it. I been using debridement and silver collagen to try and get this area to fill-in although I do not think we have made much progress this week 9/24; the patient's wound on the left medial ankle looks a lot better. The deeper divot area distally still requires debridement but this is cleaning up quite nicely we have been using silver collagen. The patient is complaining of swelling in her foot and is worried that that is contributing to the nonhealing of the ankle wound. She is also complaining of numbness in her anterior toes 10/4; left medial ankle. The small area distally still has a divot with necrotic material that I have been debriding away. This has an undermining area. She is approved for Apligraf. She saw Dr. [name] her surgeon on 10/1. I think he declared himself is satisfied with the condition of things. Still nonweightbearing till the end of the month. We are dealing with the venous insufficiency wounds on the  medial ankle. Her surgical wound is well closed. There is no evidence of infection 10/11; the patient arrived in clinic today with the expectation that we be able to put an Apligraf on this area after debridement however she arrives with a relatively large amount of green drainage on the dressing. The patient states that this started on Friday. She has not been systemically unwell. 10/19; culture I did last week showed both Enterococcus and Pseudomonas. I think this came in separate parts because I stopped her ciprofloxacin I gave her and prescribed her linezolid however now looking at the final culture result this is Pseudomonas which is resistant to quinolones. She has not yet picked up the linezolid apparently phone issues. We are also trying to get a topical antibiotic out of  [place] in [place] they can be applied by home health. She is still having green drainage 10/16; the patient has her topical antibiotic from [facility] in [place]. This is a compounded gel with vancomycin and ciprofloxacin and gentamicin. We are applying this on the wound bed with silver alginate over the top with Unna boot wraps. She arrives in clinic today with a lot less ominous looking drainage although she is only use this topical preparation once the second time today. She sees Dr. [name] her surgeon on Friday she has home health changing the dressing 11/2; still using her compounded topical antibiotic under silver alginate. Surface is cleaning up there is less drainage. We had an Apligraf for her today and I elected to apply it. A light coating of her antibiotic 04/25/2020 upon evaluation today patient appears to be doing well with regard to her ankle ulcer. There is a little bit of slough noted on the surface of the wound I am can have to perform sharp debridement to clear this away today. With that being said other than that fact overall I feel like she is making progress and we do see some new epithelial growth. There is also some improvement in the depth of the wound and that distal portion. There is little bit of slough there as well. 12/7; 2-week follow-up. Apligraf #3. Dimensions are smaller. Closing in especially inferiorly. Still some surface debris. Still using the [facility] topical antibiotic but I told her that I don't think this needs to be renewed 12/21; 2-week follow-up. Apligraf #4 dimensions are smaller. Nice improvement 06/05/2020; 2-week follow-up. The patient's wound on the left medial ankle looks really excellent. Nice granulation. Advancing epithelialization no undermining no evidence of infection. We would have to reapply for another Apligraf but with the condition of this wound I did not feel strongly about it. We used Hydrofera Blue under the same degree  of compression. She follows up with Dr. [name] her surgeon a week Friday 06/13/2020 upon evaluation today patient appears to be doing excellent in regard to her wound. She has been tolerating the dressing changes without complication. Fortunately there is no signs of active infection at this time. No fevers, chills, nausea, vomiting, or diarrhea. She was using Hydrofera Blue last week. 06/20/2020 06/20/2020 on evaluation today patient actually appears to be doing excellent in regard to her wound. This is measuring better and looking much better as well. She has been using the collagen that seems to be doing better for her as well even though the [facility] was and is not sticking or feeling as rough on her wound. She did see Dr. [name] on Friday he is very pleased he also stated none of the hardware  has shifted. That is great news 1/27; the patient has a small clean wound all that is remaining. I agree that this is too small to really consider an Apligraf. Under illumination the surface is looking quite good. We have been using collagen although the dimensions are not any better this week 2/2; the patient has a small clean wound on the left medial ankle. Although this left of her substantial original areas. Measurements are smaller. We have been using polymen Ag under an Haematologist. 2/10; small area on the left medial ankle. This looks clean nothing to debride however dimensions are about the same we have been using polymen I think now for 2 weeks 2/17; not much change in surface area. We have been using polymen Ag without any improvement. 3/17; 1 month follow-up. The patient has been using endoform without any improvement in fact I think this looks worse with more depth and more expansion 3/24; no improvement. Perhaps less debris on the surface. We have been using Sorbact for 1 week 4/4; wound measures larger. She has edema in her leg and her foot which she tells as her wrap came down. We have been  using Unna boots. Sorbact of the wound. She has been approved for Apligraf 09/12/2020 upon evaluation today patient appears to be doing well with regard to her wound. We did get the Apligraf reapproved which is great news we have that available for application today. Fortunately there is no signs of infection and overall the patient seems to be doing great. The wound bed is nice and clean. 4/27; patient presents for her second application of Apligraf. She states over the past week she has been on her feet more often due to being outside in her garden. She has noted more swelling to her foot as a result. She denies increased warmth, pain or erythema to the wound site. 10/10/2020 upon evaluation today patient appears to be doing well with regard to her wound which does not appear to be quite as irritated as last week from what I am hearing. With that being said unfortunately she is having issues with some erythema and warmth to touch as well as an increase size. I do believe this likely is infected. 10/17/2020 upon evaluation today patient appears to be doing excellent in regard to her wound this is significantly improved as compared to last week. Fortunately I think that the infection is much better controlled at this point. She did have evidence of both Enterococcus as well as Staphylococcus noted on culture. Enterococcus really would not be helped significantly by the Cipro but the wound is doing so much better I am under the assumption that the Staphylococcus is probably the main organism that is causing the current infection. Nonetheless I think that she is doing excellent as far as that is concerned and I am very pleased in that regard. I would therefore recommend she continue with the Cipro. 10/31/2020 upon evaluation today patient appears to be doing well with regard to her wound. She has been tolerating the dressing changes without complication. Fortunately there is no signs of active infection and  overall I am extremely pleased with where things stand today. No fevers, chills, nausea, vomiting, or diarrhea. With that being said she does have some green drainage coming from the wound and although it looks okay I am a little concerned about the possibility of a continuing infection. Specifically with Pseudomonas. For that reason I will go ahead and send in a prescription for Cipro  for her to be continued. 11/14/2020 upon evaluation today patient appears to be doing very well currently in regard to her wound on her leg. She has been tolerating the dressing changes without complication. Fortunately I feel like the infection is finally under good control here. Unfortunately we do not have the Apligraf for application today although we can definitely order to have it in place for next week. That will be her fifth and final of the current series. Nonetheless I feel like her wound is really doing quite well which is great news. 11/21/2020 upon evaluation today patient appears to be doing well with regard to her wound on the medial ankle. Fortunately I think the infection is under control and I do believe we can go ahead and reapply the Apligraf today. She is in agreement with that plan. There does not appear to be any signs of active infection at this time which is great news. No fevers, chills, nausea, vomiting, or diarrhea. 12/05/2020 upon evaluation today patient's wound bed actually showed signs of good granulation epithelization at this point. There does not appear to be any signs of infection which is great news and overall very pleased with where things stand. Overall the patient seems to be doing fairly well in my opinion with regard to her wound although I do believe she continues to build up a lot of biofilm I think she could benefit from using PuraPly at this point. 12/12/2020 upon evaluation today patient's wound actually appears to be doing decently well today. The Unna boot has not been quite as  well-tolerated so that more uncomfortable for her and even causing some pressure over the plate on the lateral portion of her foot which is 90 where the wound is. There did not appear to be any significant deep tissue injury with that there may be a minimal change in the skin noted I think that we may want to go back to the Coflex 2 layer which is a little bit easier on her skin it seems. 12/19/2020 upon evaluation today patient actually seems to be making great progress with the PuraPly currently. She in fact seems to be much better as far as the overall appearance of the wound bed is concerned I am very happy in this regard. I do not see any signs of of infection which is great news as well. No fevers, chills, nausea, vomiting, or diarrhea. 12/26/2020 upon evaluation today patient appears to actually be doing better in regard to her wound on the left medial ankle region. The surface of the wound is actually doing significantly better which is great news. There does not appear to be any signs of infection which is also great news and in general I am extremely pleased with where we stand today. 01/02/2021 upon evaluation today patient appears to be doing well with regard to her wound. In fact this is showing signs of excellent improvement and very pleased with where things stand. In fact the last 3 appointments have all shown signs of this getting smaller which is excellent news. I have not even had to perform any debridement and today is no exception. Overall I feel like this is dramatically improved compared to previous. T oday is PuraPly application #4. 9/41/7408 upon evaluation today patient appears to be doing excellent in regard to her wound this is continue to show signs of improvement and overall I am extremely pleased with where we stand today. She is actually here for PuraPly application #5. Every time we  have applied this she is noted definite improvement on measurements. 01/23/2021 upon  evaluation today patient is actually making good progress in regard to her wound. This was actually on just a little bit longer this time compared to previous due to the fact that she did have to go out of town. She is actually here for PuraPly application #6. We have definitely been seeing improvements in the overall quality of the tissue on the surface of the wound which is awesome news. In general I think that the patient seems to be continuing to make great progress here. 01/30/2021 upon evaluation today patient's wound is actually doing excellent. There is really not any significant biofilm buildup which is great news and overall I am extremely pleased with where things stand today. There does not appear to be any signs of active infection. No fevers, chills, nausea, vomiting, or diarrhea. 02/06/2021 upon evaluation today patient's wound is actually showing signs of excellent improvement which is great news. We continue to see the benefit of the PuraPly this is doing a great job the wound seems not really irritated whatsoever and is showing signs of good granulation at this point. Overall I am extremely happy with what we are seeing. The patient likewise is happy to hear all of this as well. 02/13/2021 upon evaluation today patient appears to be doing well with regard to her wound. This again is measuring smaller today and I am very pleased in this regard. Fortunately there does not appear to be any signs of active infection at this time which is good news from a systemic standpoint. Locally there is still some significant drainage which she does have is concerned about infection locally. No fevers, chills, nausea, vomiting, or diarrhea. 02/20/2021 upon evaluation today patient actually appears to be doing decently well in regard to her wound. She has been tolerating the dressing changes without complication. I do believe the PuraPly is helping wound bed does appear to be doing better. There is no  evidence of active infection locally or systemically at this point visually although on fluorescence imaging there still appear to be bacterial load and bioburden noted. 02/27/2021 upon evaluation today patient fortunately continues to show signs of improvement with use of the PuraPly currently. Subsequently we did review her culture results and to be honest I think that the Bactrim is probably the best option to have her continue at this point. For that reason I am get a go ahead and send in a refill today for her for an additional 2 weeks. Nonetheless I think that we are making excellent progress here. It was Enterococcus and Staphylococcus that were noted she is allergic to penicillin so there is not much I can do from the Enterococcus standpoint though the staff does seem to be sensitive to the Bactrim. 03/06/2021 upon evaluation today patient appears to be doing well with regard to her wound. This is definitely showing signs of improvement which is great news and overall I am extremely pleased with where things stand today. There does not appear to be any signs of active infection which is great news and overall and I do believe that we are headed in the appropriate direction I think the PuraPly is doing an awesome job for her. 03/20/2021 upon evaluation today patient actually appears to be doing well with regard to her wound. We have been using the PuraPly although last week when I was out of the office they actually just used endoform nonetheless this still seems to  be doing great. Fortunately there does not appear to be any signs of active infection which is great news overall I think the patient is making wonderful progress. 03/27/2021 upon evaluation today patient appears to be doing well with regard to her wound in fact there is a little drainage but otherwise her does not appear to be any major signs of open wounds nor infection at this time. Overall I am extremely pleased with where things  stand. No fevers, chills, nausea, vomiting, or diarrhea. 04/03/2021 upon evaluation today patient presents for reevaluation here in the clinic and overall she seems to be doing quite well. Fortunately there does not appear to be any signs of active infection which is great news and in general I am very pleased with how things appear. There is still a very small opening remaining but in general she is looking much better. Fortunately I do not think there is any need for the PuraPly at this point. 04/10/2021 upon evaluation today patient's wound is actually showing signs of excellent improvement. I am actually very pleased with where it stands today. I think she is very close to complete closure in fact this may be healed today. Either way I think that she may benefit from using her own compression socks for the next week and then will subsequently see how things stand following. 04/17/2021 upon evaluation today patient appears to be doing well with regard to her wound on the ankle region. This is actually showing signs of excellent improvement which is great news. I do not see any signs of active infection at this time which is great and no open wound. With that being said on the lateral portion of her ankle right at the base of her plate she actually has an area of pressure that occurred over the past week when she was not in the wrap or we been padding her. This does seem to be an issue which I think is good to be an ongoing problem I am can actually get in touch with Dr. [name] today to see what we can do in that regard. 04/24/2021 upon evaluation today patient appears to be doing well with regard to her ankle region for the most part although there does appear to be some fluid trapped underneath the area where we thought this was healed last week. Unfortunately I think at this time that we can have to reopen and clean away some of this area and then subsequently depending on how things do over the  next week or so we will see about discharge but right now I do not think that is the right way to go. She voiced understanding. 05/01/2021 upon evaluation today patient's wound still appears to be open unfortunately. I do believe that we do need to see what we can do about trying to get this closed I believe compression wrapping is probably can to be indicated here. Fortunately there is no signs of active infection at this time. 05/08/2021 upon evaluation today patient appears to be doing okay in regard to her ulcer this is very superficial and we will see any major issues here. The biggest problem currently that I find is that we just cannot get this area to completely seal up. She seemed to almost be and then things digressed a little bit. Right now is not too bad but we do need to see if we can make some difference here. I think maybe switching over to collagen may be better. 05/15/2021 upon evaluation  today patient actually appears to be doing pretty well in regard to her wound. This is measuring a little smaller and looking much better. She did see orthopedics today there to me making her a insert for her [name] to try to help straighten out her gait. I think this can be beneficial for her as well. 05/22/2021 upon evaluation today patient appears to be doing well currently in regard to her wound although is not significantly smaller unfortunately this at least does not show any signs of active infection at this time which is great news. No fevers, chills, nausea, vomiting, or diarrhea. I am going to see about switching over to endoform that is what we wanted to use in the past always been on backorder. 06/05/2021 upon evaluation today patient appears to be doing somewhat poorly in regard to her wound. This is really not showing the signs of improvement that I would have like to have seen. Fortunately I do not see any signs of active infection locally nor systemically which is great news. With that  being said even though I do not see the sign she is having some increased discomfort which is not good. She is also having some issues here with the wound looking a little larger today compared to where its been. With that being said overall I believe that we probably need to consider at least that there may be a subacute infection here and that is what is going on. We have tried multiple different medications and dressings which have done well for her in the past without seeing any signs of improvement. She is in agreement with addressing this as such. 06/12/2020 upon evaluation today patient's wound is actually showing some signs of improvement today. In fact I do not even see any need for sharp debridement based on what I see at this point today. Fortunately there is no evidence of active infection currently which is great news. No fevers, chills, nausea, vomiting, or diarrhea. 06/26/2021 upon evaluation today patient appears to be doing well with regard to her wounds. Fortunately there does not appear to be any signs of active infection at this time which is great news. I do believe the Bactrim is doing well for her. Fortunately there does not appear to be any signs of active infection at this time. With the hardware in place and everything else I do believe extending this for 2 additional weeks for 4 weeks total would be advisable. 07/03/2021 upon evaluation today patient appears to be doing well with regard to her wound. She has been tolerating the dressing changes which is great news. Fortunately there does not appear to be any evidence of active infection locally nor systemically at this time. 07/10/2021 upon evaluation today patient appears to be doing well with regard to her wound I do feel that the PuraPly has done an awesome job for her over the past week. Fortunately I do not see any signs of active infection at this time. No fevers, chills, nausea, vomiting, or diarrhea. 2/15; the patient's  wound is an inverted cone. Most of the walls of the colon are epithelialized however the tip of the conus is still open. I gather this was considered to be quite an improvement.We reapplied Puraply in the standard fashion 07/24/2021 upon evaluation patient appears to be doing well with regard to her wound. She has been tolerating the dressing changes without complication which is great news. There is no signs of active infection at this time. 07/31/2021 upon evaluation  today patient's wound is actually showing signs of improvement which is good news. Fortunately I do not see any evidence of active infection locally nor systemically at this time which is great news. With that being said the biggest issue that I see is simply that she seems to have drainage that dries over the surface of the wound not allowing it to heal appropriately. That something that I think we need to work on to try to get this to fully closed. Electronic Signature(s) Signed: 07/31/2021 11:03:51 AM By: [name] PA-C Entered By: [name] on 07/31/2021 11:03:51 -------------------------------------------------------------------------------- Physical Exam Details Patient Name: Date of Service: [facility] [name], [name] 07/31/2021 10:15 A M Medical Record Number: [id number] Patient Account Number: [id number] Date of Birth/Sex: Treating RN: [birth year] (66 y.o. F) Primary Care Provider: [name] Other Clinician: Referring Provider: Treating Provider/Extender: [name] in Treatment: 29 Constitutional Well-nourished and well-hydrated in no acute distress. Respiratory normal breathing without difficulty. Psychiatric this patient is able to make decisions and demonstrates good insight into disease process. Alert and Oriented x 3. pleasant and cooperative. Notes Upon inspection patient's wound bed actually showed signs of good granulation and epithelization at this point. Fortunately I do not  see any evidence of active infection locally or systemically at this time which is great news and overall very pleased in that regard. Electronic Signature(s) Signed: 07/31/2021 11:04:07 AM By: [name] PA-C Entered By: [name] on 07/31/2021 11:04:07 -------------------------------------------------------------------------------- Physician Orders Details Patient Name: Date of Service: [facility] [name], [name] 07/31/2021 10:15 A M Medical Record Number: [id number] Patient Account Number: [id number] Date of Birth/Sex: Treating RN: 07-28-1955 (66 y.o. [name], [name] Primary Care Provider: [name] Other Clinician: Referring Provider: Treating Provider/Extender: [name] in Treatment: 53 Verbal / Phone Orders: No Diagnosis Coding ICD-10 Coding Code Description [phone] Chronic venous hypertension (idiopathic) with ulcer and inflammation of left lower extremity L97.828 Non-pressure chronic ulcer of other part of left lower leg with other specified severity L97.328 Non-pressure chronic ulcer of left ankle with other specified severity Follow-up Appointments ppointment in 1 week. Margarita Grizzle and [place], Wednesday Room 8 Return A Cellular or Tissue Based Products Cellular or Tissue Based Product Type: - Puraply AM #2 07/10/2021 Puraply AM for #3 07/17/2021 Puraply AM for #4 07/24/2021 Bathing/ Shower/ Hygiene May shower with protection but do not get wound dressing(s) wet. Edema Control - Lymphedema / SCD / Other Bilateral Lower Extremities Elevate legs to the level of the heart or above for 30 minutes daily and/or when sitting, a frequency of: - throughout the day when sitting Avoid standing for long periods of time. Patient to wear own compression stockings every day. Exercise regularly Moisturize legs daily. Non Wound Condition pply the following to affected area as directed: - Apply silicone bordered foam to left lateral foot for  protection A Wound Treatment Wound #15R - Malleolus Wound Laterality: Left, Medial Cleanser: Wound Cleanser (Generic) 1 x Per Day/15 Days Discharge Instructions: Cleanse the wound with wound cleanser prior to applying a clean dressing using gauze sponges, not tissue or cotton balls. Peri-Wound Care: Sween Lotion (Moisturizing lotion) 1 x Per Day/15 Days Discharge Instructions: Apply moisturizing lotion as directed Prim Dressing: Xeroform Occlusive Gauze Dressing, 4x4 in 1 x Per Day/15 Days ary Discharge Instructions: Apply to wound bed as instructed Secondary Dressing: Woven Gauze Sponge, Non-Sterile 4x4 in (Generic) 1 x Per KDT/26 Days Discharge Instructions: Apply  over primary dressing as directed. Secondary Dressing: ABD Pad, 8x10 1 x Per Day/15 Days Discharge Instructions: Apply over primary dressing as directed. Compression Wrap: CoFlex TLC XL 2-layer Compression System 4x7 (in/yd) 1 x Per Day/15 Days Discharge Instructions: Apply CoFlex 2-layer compression as directed. (alt for 4 layer) Electronic Signature(s) Signed: 07/31/2021 11:44:47 AM By: [name] PA-C Signed: 07/31/2021 4:00:28 PM By: Deon Pilling RN, BSN Entered By: Deon Pilling on 07/31/2021 10:33:53 -------------------------------------------------------------------------------- Problem List Details Patient Name: Date of Service: [name], [name] 07/31/2021 10:15 A M Medical Record Number: [id number] Patient Account Number: [id number] Date of Birth/Sex: Treating RN: 07-12-1955 (66 y.o. F) Primary Care Provider: [name] Other Clinician: Referring Provider: Treating Provider/Extender: [name] in Treatment: 69 Active Problems ICD-10 Encounter Code Description Active Date MDM Diagnosis I87.332 Chronic venous hypertension (idiopathic) with ulcer and inflammation of left 01/17/2020 No Yes lower extremity L97.828 Non-pressure chronic ulcer of other part of left lower leg with  other specified 01/17/2020 No Yes severity L97.328 Non-pressure chronic ulcer of left ankle with other specified severity 01/17/2020 No Yes Inactive Problems ICD-10 Code Description Active Date Inactive Date L03.116 Cellulitis of left lower limb 01/31/2020 01/31/2020 T81.31XD Disruption of external operation (surgical) wound, not elsewhere classified, subsequent 01/17/2020 01/17/2020 encounter Resolved Problems Electronic Signature(s) Signed: 07/31/2021 10:25:10 AM By: [name] PA-C Entered By: [name] on 07/31/2021 10:25:10 -------------------------------------------------------------------------------- Progress Note Details Patient Name: Date of Service: [facility] [name], [name] 07/31/2021 10:15 A M Medical Record Number: [id number] Patient Account Number: [id number] Date of Birth/Sex: Treating RN: May 31, 1956 (66 y.o. F) Primary Care Provider: [name] Other Clinician: Referring Provider: Treating Provider/Extender: [name] in Treatment: 12 Subjective Chief Complaint Information obtained from Patient patient is been followed long-term in this clinic for venous insufficiency ulcers with inflammation, hypertension and ulceration over the medial ankle bilaterally. 01/17/2020; this is a patient who is here for review of postoperative wounds on the left lateral ankle and recurrence of venous stasis ulceration on the left medial History of Present Illness (HPI) the remaining wound is over the left medial ankle. Similar wound over the right medial ankle healed largely with use of Apligraf. Most recently we have been using Hydrofera Blue over this wound with considerable improvement. The patient has been extensively worked up in the past for her venous insufficiency and she is not a candidate for antireflux surgery although I have none of the details available currently. 08/24/14; considerable improvement today. About 50% of this wound areas now  epithelialized. The base of the wound appears to be healthier granulation.as opposed to last week when she had deteriorated a considerable improvement 08/17/14; unfortunately the wound has regressed somewhat. The areas of epithelialization from the superior aspect are not nearly as healthy as they were last week. The patient thinks her Hydrofera Blue slipped. 09/07/14; unfortunately the area has markedly regressed in the 2 weeks since I've seen this. There is an odor surrounding erythema. The healthy granulation tissue that we had at the base of the wound now is a dusky color. The nurse reports green drainage 09/14/14; the area looks somewhat better than last week. There is less erythema and less drainage. The culture I did did not show any growth. Nevertheless I think it is better to continue the Cipro and doxycycline for a further week. The remaining wound area was debridement. 09/21/14. Wound did not require debridement last week. Still less erythema and less  drainage. She can complete her antibiotics. The areas of epithelialization in the superior aspect of the wound do not look as healthy as they did some weeks ago 10/05/14 continued improvement in the condition of this wound. There is advancing epithelialization. Less aggressive debridement required 10/19/14 continued improvement in the condition and volume of this wound. Less aggressive debridement to the inferior part of this to remove surface slough and fibrinous eschar 11/02/14 no debridement is required. The surface granulation appears healthy although some of her islands of epithelialization seem to have regressed. No evidence of infection 11/16/14; lites surface debridement done of surface eschar. The wound does not look to be unhealthy. No evidence of infection. Unfortunately the patient has had podiatry issues in the right foot and for some reason has redeveloped small surface ulcerations in the medial right ankle. Her original presentation  involved wounds in this area 11/23/14 no debridement. The area on the right ankle has enlarged. The left ankle wound appears stable in terms of the surface although there is periwound inflammation. There has been regression in the amount of new skin 11/30/14 no debridement. Both wound areas appear healthy. There was no evidence of infection. The the new area on the right medial ankle has enlarged although that both the surfaces appear to be stable. 12/07/14; Debridement of the right medial ankle wound. No no debridement was done on the left. 12/14/14 no major change in and now bilateral medial ankle wounds. Both of these are very painful but the no overt evidence of infection. She has had previous venous ablation 12/21/14; patient states that her right medial ankle wound is considerably more painful last week than usual. Her left is also somewhat painful. She could not tolerate debridement. The right medial ankle wound has fibrinous surface eschar 12/28/14 this is a patient with severe bilateral venous insufficiency ulcers. For a considerable period of time we actually had the one on the right medial ankle healed however this recently opened up again in June. The left medial ankle wound has been a refractory area with some absent flows. We had some success with Hydrofera Blue on this area and it literally closed by 50% however it is recently opened up Foley. Both of these were debridement today of surface eschar. She tolerates this poorly 01/25/15: No change in the status of this. Thick adherent escar. Very poor tolerance of any attempt at debridement. I had healed the right medial malleolus wound for a considerable amount of time and had the left one down to about 50% of the volume although this is totally regressed over the last 48 weeks. Further the right leg has reopened. she is trying to make a appointment with pain and vascular, previous ablations with Dr. Aleda Grana. I do not believe there is an  arterial insufficiency issue here 02/01/15 the status of the adherent eschar bilaterally is actually improved. No debridement was done. She did not manage to get vascular studies done 02/08/15 continued debridement of the area was done today. The slough is less adherent and comes off with less pressure. There is no surrounding infection peripheral pulses are intact 02/15/15 selective debridement with a disposable curette. Again the slough is less adherent and comes off with less difficulty. No surrounding infection peripheral pulses are intact. 02/22/15 selective debridement of the right medial ankle wound. Slough comes off with less difficulty. No obvious surrounding infection peripheral pulses are intact I did not debridement the one on the left. Both of these are stable to improved 03/01/15 selective  debridement of both wound areas using a curette to. Adherent slough cup soft with less difficulty. No obvious surrounding infection. The patient tells me that 2 days ago she noted a rash above the right leg wrap. She did not have this on her lower legs when she change this over she arrives with widespread left greater than right almost folliculitis-looking rash which is extremely pruritic. I don't see anything to culture here. There is no rash on the rest of her body. She feels well systemically. 03/08/15; selective debridement of both wounds using a curette. Base of this does not look unhealthy. She had limegreen drainage coming out of the left leg wound and describes a lot of drainage. The rash on her left leg looks improved to. No cultures were done. 03/22/15; patient was not here last week. Basal wounds does not look healthy and there is no surrounding erythema. No drainage. There is still a rash on the left leg that almost looks vasculitic however it is clearly limited to the top of where the wrap would be. 04/05/15; on the right required a surgical debridement of surface eschar and necrotic subcutaneous  tissue. I did not debridement the area on the left. These continue to be large open wounds that are not changing that much. We were successful at one point in healing the area on the right, and at the same time the area on the left was roughly half the size of current measurements. I think a lot of the deterioration has to do with the prolonged time the patient is on her feet at work 04/19/15 I attempted-like surface debridement bilaterally she does not tolerate this. She tells me that she was in allergic care yesterday with extreme pain over her left lateral malleolus/ankle and was told that she has an "sprain" 05/03/15; large bilateral venous insufficiency wounds over the medial malleolus/medial aspect of her ankles. She complains of copious amounts of drainage and his usual large amounts of pain. There is some increasing erythema around the wound on the right extending into the medial aspect of her foot to. historically she came in with these wounds the right one healed and the left one came down to roughly half its current size however the right one is reopened and the left is expanded. 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 05/10/15 large bilateral venous insufficiency wounds. There is less adherence surface left however the surface culture that I did last week grew pseudomonas therefore bilateral selective debridement score necessary. There is surrounding erythema. The patient describes severe bilateral drainage and a lot of pain in the left ankle. Apparently her podiatrist was were ready to do a cortisone shot 05/17/15; the patient complains of pain and again copious amounts of drainage. 05/24/15; we used Iodo flex last week. Patient notes considerable improvement in wound drainage. Only needed to change this once. 05/31/15; we continued Iodoflex; the base of these large wounds bilaterally is not too bad but there is probably likely a  significant bioburden here. I would like to debridement just doesn't tolerate it. 06/06/14 I would like to continue the Iodoflex although she still hasn't managed to obtain supplies. She has bilateral medial malleoli or large wounds which are mostly superficial. Both of them are covered circumferentially with some nonviable fibrinous slough although she tolerates debridement very poorly. She apparently has an appointment for an ablation on the right leg by interventional radiology. 06/14/15; the patient  arrives with the wounds and static condition. We attempted a debridement although she does not do well with this secondary to pain. I 07/05/15; wounds are not much smaller however there appears to be a cleaner granulating base. The left has tight fibrinous slough greater than the right. Debridement is tolerated poorly due to pain. Iodoflex is done more for these wounds in any of the multitude of different dressings I have tried on the left 1 and then subsequently the right. 07/12/15; no change in the condition of this wound. I am able to do an aggressive debridement on the right but not the left. She simply cannot tolerate it. We have been using Iodoflex which helps somewhat. It is worthwhile remembering that at one point we healed the right medial ankle wound and the left was about 25% of the current circumference. We have suggested returning to vascular surgery for review of possible further ablations for one reason or another she has not been able to do this. 07/26/15 no major change in the condition of either wound on her medial ankle. I did not attempt to debridement of these. She has been aggressively scrubbing these while she is in the shower at home. She has her supply of Iodoflex which seems to have done more for these wounds then anything I have put on recently. 08/09/15 wound area appears larger although not verified by measurements. Using Iodoflex 09/05/2015 -- she was here for avisit today but had  significant problems with the wound and I was asked to see her for a physician opinion. I have summarize that this lady has had surgery on her left lower extremity about 10 years ago where the possible veins stripping was done. She has had an opinion from interventional radiology around November 2016 where no further sclerotherapy was ordered. The patient works 12 hours a day and stands on a concrete floor with work boots and is unable to get the proper compression she requires and cannot elevate her limbs appropriately at any given time. She has recently grown Pseudomonas from her wound culture but has not started her ciprofloxacin which was called in for her. 09/13/15 this continues to be a difficult situation for this patient. At one point I had this wound down to a 1.5 x 1.5" wound on her left leg. This is deteriorated and the right leg has reopened. She now has substantial wounds on her medial calcaneus, malleoli and into her lower leg. One on the left has surface eschar but these are far too painful for me to debridement here. She has a vascular surgery appointment next week to see if anything can be done to help here. I think she has had previous ablations several years ago at [facility]. She has no major edema. She tells me that she did not get product last time [facility] Ag] and went for several days without it. She continues to work in work boots 12 hours a day. She cannot get compression/4-layer under her work boots. 09/20/15 no major change. Periwound edema control was not very good. Her point with pain and vascular is next Wednesday the 25th 09/28/15; the patient is seen vascular surgery and is apparently scheduled for repeat duplex ultrasounds of her bilateral lower legs next week. 10/05/15; the patient was seen by Dr. Doren Custard of vascular surgery. He feels that she should have arterial insufficiency excluded as cause/contributed to her nonhealing stage she is therefore booked for an  arteriogram. She has apparently monophasic signals in the dorsalis pedis pulses.  She also of course has known severe chronic venous insufficiency with previous procedures as noted previously. I had another long discussion with the patient today about her continuing to work 12 hour shifts. I've written her out for 2 months area had concerns about this as her work location is currently undergoing significant turmoil and this may lead to her termination. She is aware of this however I agree with her that she simply cannot continue to stand for 12 hours multiple days a week with the substantial wound areas she has. 10/19/15; the Dr. Doren Custard appointment was largely for an arteriogram which was normal. She does not have an arterial issue. He didn't make a comment about her chronic venous insufficiency for which she has had previous ablations. Presumably it was not felt that anything additional could be done. The patient is now out of work as I prescribed 2 weeks ago. Her wounds look somewhat less aggravated presumably because of this. I felt I would give debridement another try today 10/25/15; no major change in this patient's wounds. We are struggling to get her product that she can afford into her own home through her insurance. 11/01/15; no major change in the patient's wounds. I have been using silver alginate as the most affordable product. I spoke to Dr. Marla Roe last week with her requested take her to the OR for surgical debridement and placement of ACEL. Dr. Marla Roe told me that she would be willing to do this however [employer] will not cover this, fortunately the patient has [place] healthcare of some variant 11/08/15; no major change in the patient's wounds. She has been completely nonviable surface that this but is in too much pain with any attempted debridement are clinic. I have arranged for her to see Dr. Marla Roe ham of plastic surgery and this appointment is on Monday. I am  hopeful that they will take her to the OR for debridement, possible ACEL ultimately possible skin graft 11/22/15 no major change in the patient's wounds over her bilateral medial calcaneus medial malleolus into the lower legs. Surface on these does not look too bad however on the left there is surrounding erythema and tenderness. This may be cellulitis or could him sleepy tinea. 11/29/15; no major changes in the patient's wounds over her bilateral medial malleolus. There is no infection here and I don't think any additional antibiotics are necessary. There is now plan to move forward. She sees Dr. Marla Roe in a week's time for preparation for operative debridement and ACEL placement I believe on 7/12. She then has a follow-up appointment with Dr. Marla Roe on 7/21 12/28/15; the patient returns today having been taken to the [place] by Dr. Marla Roe 12/12/15 she underwent debridement, intraoperative cultures [which were negative]. She had placement of a wound VAC. Parent really ACEL was not available to be placed. The wound VAC foam apparently adhered to the wound since then she's been using silver alginate, Xeroform under Ace wraps. She still says there is a lot of drainage and a lot of pain 01/31/16; this is a patient I see monthly. I had referred her to Dr. Marla Roe him of plastic surgery for large wounds on her bilateral medial ankles. She has been to the OR twice once in early July and once in early August. She tells me over the last 3 weeks she has been using the wound VAC with ACEL underneath it. On the right we've simply been using silver alginate. Under Kerlix Coban wraps. 02/28/16; this is a patient  I'm currently seeing monthly. She is gone on to have a skin graft over her large venous insufficiency ulcer on the left medial ankle. This was done by Dr. Marla Roe him. The patient is a bit perturbed about why she didn't have one on her right medial ankle wound. She has been using silver alginate to  this. 03/06/16; I received a phone call from her plastic surgery Dr. Marla Roe. She expressed some concern about the viability of the skin graft she did on the left medial ankle wound. Asked me to place Endoform on this. She told me she is not planning to do a subsequent skin graft on the right as the left one did not take very well. I had placed Hydrofera Blue on the right 03/13/16; continue to have a reasonably healthy wound on the right medial ankle. Down to 3 mm in terms of size. There is epithelialization here. The area on the left medial ankle is her skin graft site. I suppose the last week this looks somewhat better. She has an open area inferiorly however in the center there appears to be some viable tissue. There is a lot of surface callus and eschar that will eventually need to come off however none of this looked to be infected. Patient states that the is able to keep the dressing on for several days which is an improvement. 03/20/16 no major change in the circumference of either wound however on the left side the patient was at Dr. Eusebio Friendly office and they did a debridement of left wound. 50% of the wound seems to be epithelialized. I been using Endoform on the left Hydrofera Blue in the right 03/27/16; she arrives today with her wound is not looking as healthy as they did last week. The area on the right clearly has an adherent surface to this a very similar surface on the left. Unfortunately for this patient this is all too familiar problem. Clearly the Endoform is not working and will need to change that today that has some potential to help this surface. She does not tolerate debridement in this clinic very well. She is changing the dressing wants 04/03/16; patient arrives with the wounds looking somewhat better especially on the right. Dr. Migdalia Dk change the dressing to silver alginate when she saw her on Monday and also sold her some compression socks. The usefulness of the latter  is really not clear and woman with severely draining wounds. 04/10/16; the patient is doing a bit of an experiment wearing the compression stockings that Dr. Migdalia Dk provided her to her left leg and the out of legs based dressings that we provided to the right. 05/01/16; the patient is continuing to wear compression stockings Dr. Migdalia Dk provided her on the left that are apparently silver impregnated. She has been using Iodoflex to the right leg wound. Still a moderate amount of drainage, when she leaves here the wraps only last for 4 days. She has to change the stocking on the left leg every night 05/15/16; she is now using compression stockings bilaterally provided by Dr. Marla Roe. She is wearing a nonadherent layer over the wounds so really I don't think there is anything specific being done to this now. She has some reduction on the left wound. The right is stable. I think all healing here is being done without a specific dressing 06/09/16; patient arrives here today with not much change in the wound certainly in diameter to large circular wounds over the medial aspect of her ankle bilaterally. Under  the light of these services are certainly not viable for healing. There is no evidence of surrounding infection. She is wearing compression stockings with some sort of silver impregnation as prescribed by Dr. Marla Roe. She has a follow-up with her tomorrow. 06/30/16; no major change in the size or condition of her wounds. These are still probably covered with a nonviable surface. She is using only her purchase stockings. She did see Dr. Marla Roe who seemed to want to apply Dakin's solution to this I'm not extreme short what value this would be. I would suggest Iodoflex which she still has at home. 07/28/16; I follow Mrs. Roca episodically along with Dr. Marla Roe. 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 She has been applying a topical  collagen ointment to both wounds with Adaptic. I don't think Dr. Marla Roe is planning to take her back to the OR. 08/19/16; I follow Mrs. Jeneen Rinks on a monthly basis along with Dr. Marla Roe of plastic surgery. 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 I been following her for a number of years. At one point I was able to get the right medial malleolus wound to heal and had the left medial malleolus down to about half its current size however and I had to send her to plastic surgery for an operative debridement. Since then things have been stable to slightly improve the area on the right is slightly better one in the left about the same although there is much less adherent surface than I'm used to with this patient. She is using some form of liquid collagen gel that Dr. Marla Roe provided a Kerlix cover with the patient's own pressure stockings. She tells me that she has extreme pain in both ankles and along the lateral aspect of both feet. She has been unable to work for some period of time. She is telling me she is retiring at the beginning of April. She sees Dr. Doran Durand of orthopedics next week 09/22/16; patient has not seen Dr. Marla Roe since the last time she is here. I'm not really sure what she is using to the wounds other than bits and pieces of think she had left over including most recently Hydrofera Blue. She is using juxtalite stockings. She is having difficulty with her husband's recent illness "stroke". She is having to transport him to various doctors appointments. Dr. Marla Roe left her the option of a repeat debridement with ACEL however she has not been able to get the time to follow-up on this. She continues to have a fair amount of drainage out of these wounds with certainly precludes leaving dressings on all week 10/13/16; patient has not seen Dr. Marla Roe since she was last in our clinic. I'm not really sure  what she is doing with the wounds, we did try to get her [facility] and I think she is actually using this most of the time. Because of drainage she states she has to change this every second day although this is an improvement from what she used to do. She went to see Dr. Doran Durand who did not think she had a muscular issue with regards to her feet, he referred her to a neurologist and I think the appointment is sometime in June. I changed her back to Iodoflex which she has used in the past but not recently. 11/03/16; the patient has been using Iodoflex although she ran out of this. Still claims  that there is a lot of drainage although the wound does not look like this. No surrounding erythema. She has not been back to see Dr. Marla Roe 11/24/16; the patient has been using Iodoflex again but she ran out of it 2 or 3 days ago. There is no major change in the condition of either one of these wounds in fact they are larger and covered in a thick adherent surface slough/nonviable tissue especially on the left. She does not tolerate mechanical debridement in our clinic. Going back to see Dr. Marla Roe of plastic surgery for an operative debridement would seem reasonable. 12/15/16; the patient has not been back to see Dr. Marla Roe. She is been dealing with a series of illnesses and her husband which of monopolized her time. She is been using Sorbact which we largely supplied. She states the drainage is bad enough that it maximum she can go 2-3 days without changing the dressing 01/12/2017 -- the patient has not been back for about 4 weeks and has not seen Dr. Marla Roe not does she have any appointment pending. 01/23/17; patient has not seen Dr. Marla Roe even though I suggested this previously. She is using Santyl that was suggested last week by Dr. Con Memos this Cost her $16 through her insurance which is indeed surprising 02/12/17; continuing Santyl and the patient is changing this daily. A lot of  drainage. She has not been back to see plastic surgery she is using an Ace wrap. Our intake nurse suggested wrap around stockings which would make a good reasonable alternative 02/26/17; patient is been using Santyl and changing this daily due to drainage. She has not been to see plastic surgery she uses in April Ace wrap to control the edema. She did obtain extremitease stockings but stated that the edema in her leg was to big for these 03/20/17; patient is using Santyl and Anasept. Surfaces looked better today the area on the right is actually measuring a little smaller. She has states she has a lot of pain in her feet and ankles and is asking for a consult to pain control which I'll try to help her with through our case manager. 04/10/17; the patient arrives with better-looking wound surfaces and is slightly smaller wound on the left she is using a combination of Santyl and Anasept. She has an appointment or at least as started in the pain control center associated with [facility] 05/14/17; this is a patient who I followed for a prolonged period of time. She has venous insufficiency ulcers on her bilateral medial ankles. At one point I had this down to a much smaller wound on the left however these reopened and we've never been able to get these to heal. She has been using Santyl and Anasept gel although 2 weeks ago she ran out of the Anasept gel. She has a stable appearance of the wound. She is going to the wound care clinic at [facility]. They wanted do a nerve block/spinal block although she tells me she is reluctant to go forward with that. 05/21/17; this is a patient I have followed for many years. She has venous insufficiency ulcers on her bilateral medial ankles. Chronic pain and deformity in her ankles as well. She is been to see plastic surgery as well as orthopedics. Using PolyMem AG most recently/Kerramax/ABDs and 2 layer compression. She has managed to keep this on and she is  coming in for a nurse check to change the dressing on Tuesdays, we see her on Fridays 06/05/17;  really quite a good looking surface and the area especially on the right medial has contracted in terms of dimensions. Well granulated healthy-looking tissue on both sides. Even with an open curet there is nothing that even feels abnormal here. This is as good as I've seen this in quite some time. We have been using PolyMem AG and bringing her in for a nurse check 06/12/17; really quite good surface on both of these wounds. The right medial has contracted a bit left is not. We've been using PolyMem and AG and she is coming in for a nurse visit 06/19/17; we have been using PolyMem AG and bringing her in for a nurse check. Dimensions of her wounds are not better but the surfaces looked better bilaterally. She complained of bleeding last night and the left wound and increasing pain bilaterally. She states her wound pain is more neuropathic than just the wounds. There was some suggestion that this was radicular from her pain management doctor in talking to her it is really difficult to sort this out. 06/26/17; using PolyMem and AG and bringing her in for a nurse check as All of this and reasonably stable condition. Certainly not improved. The dimensions on the lateral part of the right leg look better but not really measuring better. The medial aspect on the left is about the same. 07/03/16; we have been using PolyMen AG and bringing her in for a nurse check to change the dressings as the wounds have drainage which precludes once weekly changing. We are using all secondary absorptive dressings.our intake nurse is brought up the idea of using a wound VAC/snap VAC on the wound to help with the drainage to see if this would result in some contraction. This is not a bad idea. The area on the right medial is actually looking smaller. Both wounds have a reasonable-looking surface. There is no evidence of cellulitis. The  edema is well controlled 07/10/17; the patient was denied for a snap VAC by her insurance. The major issue with these wounds continues to be drainage. We are using wicked PolyMem AG and she is coming in for a nurse visit to change this. The wounds are stable to slightly improved. The surface looks vibrant and the area on the right certainly has shrunk in size but very slowly 07/17/17; the patient still has large wounds on her bilateral medial malleoli. Surface of both of these wounds looks better. The dimensions seem to come and go but no consistent improvement. There is no epithelialization. We do not have options for advanced treatment products due to insurance issues. They did not approve of the wound VAC to help control the drainage. More recently we've been using PolyMem and AG wicked to allow drainage through. We have been bringing her in for a nurse visit to change this. We do not have a lot of options for wound care products and the home again due to insurance issues 07/24/17; the patient's wound actually looks somewhat better today. No drainage measurements are smaller still healthy-looking surface. We used silver collagen under PolyMen started last week. We have been bringing her in for a dressing change 07/31/17; patient's wound surface continued to look better and I think there is visible change in the dimensions of the wound on the right. Rims of epithelialization. We have been using silver collagen under PolyMen and bringing her in for a dressing change. There appears to be less drainage although she is still in need of the dressing change 08/07/17. Patient's  wound surface continues to look better on both sides and the area on the right is definitely smaller. We have been using silver collagen and PolyMen. She feels that the drainage has been it has been better. I asked her about her vascular status. She went to see Dr. Aleda Grana at [facility] and had some form of ablation. I don't have  much detail on this. I haven't my notes from 2016 that she was not a candidate for any further ablation but I don't have any more information on this. We had referred her to vein and vascular I don't think she ever went. He does not have a history of PAD although I don't have any information on this either. We don't even have ABIs in our record 08/14/17; we've been using silver collagen and PolyMen cover. And putting the patient and compression. She we are bringing her in as a nurse visit to change this because ofarge amount of drainage. We didn't the ABIs in clinic today since they had been done in many moons 1.2 bilaterally. She has been to see vein and vascular however this was at [facility] and she had ablation although I really don't have any information on this all seemed biking get a report. She is also been operatively debrided by plastic surgery and had a cell placed probably 8-12 months ago. This didn't have a major effect. We've been making some gains with current dressings 08/19/17-She is here in follow-up evaluation for bilateral medial malleoli ulcers. She continues to tolerate debridement very poorly. We will continue with recently changed topical treatment; if no significant improvement may consider switching to Iodosorb/Iodoflex. 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 She has been using silver collagen and PolyMem. I believe she has been used and tried on Iodoflex before. 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 These have been very refractory over time. We have been using silver collagen and PolyMen. She is coming in once a week for a doctor's and once a week for nurse visits. We are actually making some progress 09/18/17; the patient's  wounds are smaller especially on the right medial. She arrives today to upset to consider even washing these off with Anasept which I think is been part of the reason this is been closing. We've been using collagen covered in PolyMen otherwise. It is noted that she has a small area of folliculitis on the right medial calf that. As we are wrapping her legs I'll give her a short course of doxycycline to make sure this doesn't amount to anything. She is a long list of complaints today including imbalance, shortness of breath on exertion, inversion of her left ankle. With regards to the latter complaints she is been to see orthopedics and they offered her a tendon release surgery I believe but wanted her wounds to be closed first. I have recommended she go see her primary physician with regards to everything else. 09/25/17; patient's wounds are about the same size. We have made some progress bilaterally although not in recent weeks. She will not allow me T wash these wounds with Anasept even if she is doing her cell. Wheeze we've been using collagen covered in PolyMen. Last week she had a small area of folliculitis this is now opened into a small  wound. She completed 5 days of trimethoprim sulfamethoxazole 10/02/17; unfortunately the area on her left medial ankle is worse with a larger wound area towards the Achilles. The patient complains of a lot of pain. She will not allow debridement although visually I don't think there is anything to debridement in any case. We have been using silver collagen and PolyMen for several months now. Initially we are making some progress although I'm not really seeing that today. We will move back to [facility]. His admittedly this is a bit of a repeat however I'm hoping that his situation is different now. The patient tells me she had her leg on the left give out on her yesterday this is process some pain. 10/09/17; the patient is seen twice a week largely because of  drainage issues coming out of the chronic medial bimalleolar wounds that are chronic. Last week the dimensions of the one on the left looks a little larger I changed her to [facility]. She comes in today with a history of terrible pain in the bilateral wound areas. She will not allow debridement. She will not even allow a tissue culture. There is no surrounding erythema no no evidence of cellulitis. We have been putting her Kerlix Coban man. She will not allow more aggressive compression as there was a suggestion to put her in 3 layer wraps. 10/16/17; large wounds on her bilateral medial malleoli. These are chronic. Not much change from last week. The surface looks have healthy but absolutely no epithelialization. A lot of pain little less so of drainage. She will not allow debridement or even washing these off in the vigorous fashion with Anasept. 10/23/17; large wounds on her bilateral malleoli which are chronic. Some improvement in terms of size perhaps on the right since last time I saw these. She states that after we increased the 3 layer compression there was some bleeding, when she came in for a nurse visit she did not want 3 layer compression put back on about our nurse managed to convince her. She has known chronic venous visit issues and I'm hoping to get her to tolerate the 3 layer compression. using Hydrofera Blue 10/30/17; absolutely no change in the condition of either wound although we've had some improvement in dimensions on the right.. Attempted to put her in 3 layer compression she didn't tolerated she is back in 2 layer compression. We've been using Hydrofera Blue We looked over her past records. She had venous reflux studies in November 2016. There was no evidence of deep venous reflux on the right. Superficial vein did not show the greater saphenous vein at think this is been previously ablated the small saphenous vein was within normal limits. The left deep venous system showed  no DVT the vessels were positive for deep venous reflux in the posterior tibial veins at the ankle. The greater saphenous vein was surgically absent small saphenous vein was within normal limits. She went to vein and vascular at [facility]. I believe she had an ablation on the left greater saphenous vein. I'll update her reflux studies perhaps ever reviewed by vein and vascular. We've made absolutely no progress in these wounds. Will also try to read and TheraSkins through her insurance 11/06/17; W the patient apparently has a 2 week follow-up with vein and vascular I like him to review the whole issue with regards to her previous vascular workup by Dr. Aleda Grana. We've really made no progress on these wounds in many months. She arrives today  with less viable looking surface on the left medial ankle wound. This was apparently looking about the same on Tuesday when she was here for nurse visit. 11/13/17; deep tissue culture I did last time of the left lower leg showed multiple organisms without any predominating. In particular no Staphylococcus or group A strep were isolated. We sent her for venous reflux studies. She's had a previous left greater saphenous vein stripping and I think sclerotherapy of the right greater saphenous vein. She didn't really look at the lesser saphenous vein this both wounds are on the medial aspect. She has reflux in the common femoral vein and popliteal vein and an accessory vein on the right and the common femoral vein and popliteal vein on the left. I'm going to have her go to see vein and vascular just the look over things and see if anything else beside aggressive compression is indicated here. We have not been able to make any progress on these wounds in spite of the fact that the surface of the wounds is never look too bad. 11/20/17; no major change in the condition of the wounds. Patient reports a large amount of drainage. She has a lot of complaints of pain  although enlisting her today I wonder if some of this at least his neuropathic rather than secondary to her wounds. She has an appointment with vein and vascular on 12/30/17. The refractory nature of these wounds in my mind at least need vein and vascular to look over the wounds the recent reflux studies we did and her history to see if anything further can be done here. I also note her gait is deteriorated quite a bit. 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 She has a bilateral Trendelenburg gait. I wonder if this is neuropathic or perhaps multilevel radicular. 11/27/17; her wounds actually looks slightly better. Healthy-looking granulation tissue a scant amount of epithelialization. [place] [employer] will not pay for [employer]. They will play for tri layer Oasis and Dermagraft. This is not a diabetic ulcer. We'll try for the tri layer Oasis. She still complains of some drainage. She has a vein and vascular appointment on 12/30/17 12/04/17; the wounds visually look quite good. Healthy-looking granulation with some degree of epithelialization. We are still waiting for response to our request for trial to try layer Oasis. Her appointment with vascular to review venous and arterial issues isn't sold the end of July 7/31. Not allow debridement or even vigorous cleansing of the wound surface. 12/18/17; slightly smaller especially on the right. 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 We've been using Hydrofera Blue. We still are looking into triple layer Oasis through her insurance 01/08/18 on evaluation today patient's wound actually appears to be showing signs of good improvement at this point in time. She has been tolerating the dressing changes without complication. Fortunately there does not appear to be any evidence of infection at this point in time. We have been utilizing silver nitrate which does seem to be of benefit for her which is also good news. Overall  I'm very happy with how things seem to be both regards appearance as well as measurement. Patient did see Dr. Bridgett Larsson for evaluation on 12/30/17. In his assessment he felt that stripping would not likely add much more than chronic compression to the patient's healing process. His recommendation was to follow-up in three months with Dr. Doren Custard if she hasn't healed in order to consider referral back to you and see vascular  where she previously was in a trial and was able to get her wound to heal. I'll be see what she feels she when you staying compression and he reiterated this as well. 01/13/18 on evaluation today patient appears to actually be doing very well in regard to her bilateral medial malleolus ulcers. She seems to have tolerated the chemical cauterization with silver nitrate last week she did have some pain through that evening but fortunately states that I'll be see since it seems to be doing better she is overall pleased with the progress. 01/21/18; really quite a remarkable improvement since I've last seen these wounds. We started using silver nitrate specially on the islands of hyper granulation which for some reason her around the wound circumference. This is really done quite nicely. Primary dressing Hydrofera Blue under 4 layer compression. She seems to be able to hold out without a nurse rewrap. Follow-up in 1 week 01/28/18; we've continued the hydrofera blue but continued with chemical cauterization to the wound area that we started about a month ago for irregular hyper granulation. She is made almost stunning improvement in the overall wound dimensions. I was not really expecting this degree of improvement in these chronic wounds 02/05/18; we continue with Hydrofera Bluebut of also continued the aggressive chemical cauterization with silver nitrate. We made nice progress with the right greater than left wound. 02/12/18. We continued with Hydrofera Blue after aggressive chemical cauterization  with silver nitrate. We appear to be making nice progress with both wound areas 02/19/2018; we continue with [facility] after washing the wounds vigorously with Anasept spray and chemical cauterization with silver nitrate. We are making excellent progress. The area on the right's just about closed 02/26/2018. The area on the left medial ankle had too much necrotic debris today. I used a #5 curette we are able to get most of the soft. I continued with the silver nitrate to the much smaller wound on the right medial ankle she had a new area on her right lower pretibial area which she says was due to a role in her compression 03/05/2018; both wound areas look healthy. Not much change in dimensions from last week. I continue to use silver nitrate and Hydrofera Blue. The patient saw Dr. Doren Custard of vein and vascular. He felt she had venous stasis ulcers. He felt based on her previous arteriogram she should have adequate circulation for healing. Also she has deep venous reflux but really no significant correctable superficial venous reflux at this time. He felt we should continue with conservative management including leg elevation and compression 04/02/2018; since we last saw this woman about a month ago she had a fall apparently suffered a pelvic fracture. I did not look up the x-ray. Nevertheless because of pain she literally was bedbound for 2 weeks and had home health coming out to change the dressing. Somewhat predictably this is resulted in considerable improvement in both wound areas. The right is just about closed on the medial malleolus and the left is about half the size. 04/16/2018; both her wounds continue to go down in size. Using Hydrofera Blue. 05/07/18; both her wounds appeared to be improving especially on the right where it is almost closed. We are using Hydrofera Blue 05/14/2018; slightly worse this week with larger wounds. Surface on the left medial not quite as good. We have been using  Hydrofera Blue 05/21/18; again the wounds are slightly larger. Left medial malleolus slightly larger with eschar around the circumference. We have been using  Hydrofera Blue undergoing a wraps for a prolonged period of time. This got a lot better when she was more recumbent due to a fall and a back injury. I change the primary dressing the silver alginate today. She did not tolerate a 4 layer compression previously although I may need to bring this up with her next time 05/28/2018; area on the left medial malleolus again is slightly larger with more drainage. Area on the right is roughly unchanged. She has a small area of folliculitis on the right medial just on the lower calf. This does not look ominous. 06/03/2018 left medial malleolus slightly smaller in a better looking surface. We used silver nitrate on this last time with silver alginate. The area on the right appears slightly smaller 1/10; left medial malleolus slightly smaller. Small open area on the right. We used silver nitrate and silver alginate as of 2 weeks ago. We continue with the wound and compression. These got a lot better when she was off her feet 1/17; right medial malleolus wound is smaller. The left may be slightly smaller. Both surfaces look somewhat better. 1/24; both wounds are slightly smaller. Using silver alginate under Unna boots 1/31; both wounds appear smaller in fact the area on the right medial is just about closed. Surface eschar. We have been using silver alginate under Unna boots. The patient is less active now spends let much less time on her feet and I think this is contributed to the general improvement in the wound condition 2/7; both wounds appear smaller. I was hopeful the right medial would be closed however there there is still the same small open area. Slight amount of surface eschar on the left the dimensions are smaller there is eschar but the wound edges appear to be free. We have been using silver  alginate under Unna boot's 2/14; both wounds once again measure smaller. Circumferential eschar on the left medial. We have been using silver alginate under Unna boots with gradual improvement 2/21; the area on the right medial malleolus has healed. The area on the left is smaller. We have been using silver alginate and Unna boots. We can discharge wrapping the right leg she has 20/30 stockings at home she will need to protect the scar tissue in this area 2/28; the area on the right medial malleolus remains closed the patient has a compression stocking. The area on the left is smaller. We have been using silver alginate and Unna boots. 3/6 the area on the right medial ankle remains closed. Good edema control noted she is using her own compression stocking. The area on the left medial ankle is smaller. We have been managing this with silver alginate and Unna boots which we will continue today. 3/13; the area on the right medial ankle remains closed and I'm declaring it healed today. When necessary the left is about the same still a healthy-looking surface but no major change and wound area. No evidence of infection and using silver alginate under unna and generally making considerable improvement 3/27 the area on the right medial ankle remains closed the area on the left is about the same as last week. Certainly not any worse we have been using silver alginate under an Unna boot 4/3; the area on the right medial ankle remains closed per the patient. We did not look at this wound. The wound on the left medial ankle is about the same surface looks healthy we have been using silver alginate under an Unna boot 4/10;  area on the right medial ankle remains closed per the patient. We did not look at this wound. The wound on the left medial ankle is slightly larger. The patient complains that the [facility] caused burning pain all week. She also told us that she was a lot more active this week. Changed  her back to silver alginate 4/17; right medial ankle still closed per the patient. Left medial ankle is slightly larger. Using silver alginate. She did not tolerate Hydrofera Blue on this area 4/24; right medial ankle remains closed we have not look at this. The left medial ankle continues to get larger today by about a centimeter. We have been using silver alginate under Unna boots. She complains about 4 layer compression as an alternative. She has been up on her feet working on her garden 5/8; right medial ankle remains closed we did not look at this. The left medial ankle has increased in size about 100%. We have been using silver alginate under Unna boots. She noted increased pain this week and was not surprised that the wound is deteriorated 5/15; no major change in SA however much less erythema ( one week of doxy ocellulitis). 5/22-66 year old female returns at 1 week to the clinic for left medial ankle wound for which we have been using silver alginate under 3 layer compression She was placed on DOXY at last visit - the wound is wider at this visit. She is in 3 layer compression 5/29; change to [facility] last week. I had given her empiric doxycycline 2 weeks ago for a week. She is in 3 layer compression. She complains of a lot of pain and drainage on presentation today. 6/5; using Hydrofera Blue. I gave her doxycycline recently empirically for erythema and pain around the wound. Believe her cultures showed enterococcus which not would not have been well covered by doxycycline nevertheless the wound looks better and I don't feel specifically that the enterococcus needs to be covered. She has a new what looks like a wrap injury on her lateral left ankle. 6/12; she is using Hydrofera Blue. She has a new area on the left anterior lower tibial area. This was a wrap injury last week. 6/19; the patient is using Hydrofera Blue. She arrived with marked inflammation and erythema around the wound  and tenderness. 12/01/18 on evaluation today patient appears to be doing a little bit better based on what I'm hearing from the standpoint of lassos evaluation to this as far as the overall appearance of the wound is concerned. Then sometime substandard she typically sees Dr. Dellia Nims. Nonetheless overall very pleased with the progress that she's made up to this point. No fevers, chills, nausea, or vomiting noted at this time. 7/10; some improvement in the surface area. Aggressively debrided last week apparently. I went ahead with the debridement today although the patient does not tolerate this very well. We have been using Iodoflex. Still a fair amount of drainage 7/17; slightly smaller. Using Iodoflex. 7/24; no change from last week in terms of surface area. We have been using Iodoflex. Surface looks and continues to look somewhat better 7/31; surface area slightly smaller better looking surface. We have been using Iodoflex. This is under Unna boot compression 8/7-Patient presents at 1 week with Unna boot and Iodoflex, wound appears better 8/14-Patient presents at 1 week with Iodoflex, we use the Unna boot, wound appears to be stable better.Patient is getting Botox treatment for the inversion of the foot for tendon release, Next week 8/21;  we are using Iodoflex. Unna boot. The wound is stable in terms of surface area. Under illumination there is some areas of the wound that appear to be either epithelialized or perhaps this is adherent slough at this point I was not really clear. It did not wipe off and I was reluctant to debride this today. 8/28; we are using Iodoflex in an Unna boot. Seems to be making good improvement. 9/4; using Iodoflex and wound is slightly smaller. 9/18; we are using Iodoflex with topical silver nitrate when she is here. The wound continues to be smaller 10/2; patient missed her appointment last week due to GI issues. She left and Iodoflex based dressing on for 2 weeks. Wound  is about the same size about the size of a dime on the left medial lower 10/9 we have been using Iodoflex on the medial left ankle wound. She has a new superficial probable wrap injury on the dorsal left ankle 10/16; we have been using Hydrofera Blue since last week. This is on the left medial ankle 10/23; we have been using Hydrofera Blue since 2 weeks ago. This is on the left medial ankle. Dimensions are better 11/6; using Hydrofera Blue. I think the wound is smaller but still not closed. Left medial ankle 11/13; we have been using Hydrofera Blue. Wound is certainly no smaller this week. Also the surface not as good. This is the remanent of a very large area on her left medial ankle. 11/20; using Sorbact since last week. Wound was about the same in terms of size although I was disappointed about the surface debris 12/11; 3-week follow-up. Patient was on vacation. Wound is measuring slightly larger we have been using Sorbact. 12/18; wound is about the same size however surface looks better last week after debridement. We have been using Sorbact under compression 1/15 wound is probably twice the size of last time increased in length nonviable surface. We have been using Sorbact. She was running a mild fever and missed her appointment last week 1/22; the wound is come down in size but under illumination still a very adherent debris we have been Hydrofera Blue that I changed her to last week 1/29; dimensions down slightly. We have been using Hydrofera Blue 2/19 dimensions are the same however there is rims of epithelialization under illumination. Therefore more the surface area may be epithelialized 2/26; the patient's wound actually measures smaller. The wound looks healthy. We have been using Hydrofera Blue. I had some thoughts about running Apligraf then I still may do that however this looks so much better this week we will delay that for now 3/5; the wound is small but about the same as last  week. We have been using Hydrofera Blue. No debridement is required today. 3/19; the wound is about the size of a dime. Healthy looking wound even under illumination. We have been using Hydrofera Blue. No mechanical debridement is necessary 3/26; not much change from last week although still looks very healthy. We have been using Hydrofera Blue under Unna boots Patient was offered an ankle fusion by podiatry but not until the wound heals with a proceed with this. 4/9; the patient comes in today with her original wound on the medial ankle looking satisfactory however she has some uncontrolled swelling in the middle part of her leg with 2 new open areas superiorly just lateral to the tibia. I think this was probably a wrap issue. She said she felt uncomfortable during the week but did not call  in. We have been using Hydrofera Blue 4/16; the wound on the medial ankle is about the same. She has innumerable small areas superior to this across her mid tibia. I think this is probably folliculitis. She is also been working in the yard doing a lot of sweating 4/30; the patient issue on the upper areas across her mid tibia of all healed. I think this was excessive yard work if I remember. Her wound on the medial ankle is smaller. Some debris on this we have been using Hydrofera Blue under Unna boots 5/7; mid tibia. She has been using Hydrofera Blue under an Unna wrap. She is apparently going for her ankle surgery on June 3 10/28/19-Patient returns to clinic with the ankle wound, we are using Hydrofera Blue under Unna wrap, surgery is scheduled for her left foot for June 3 so she will be back for nurse visit next week READMISSION 01/17/2020 Mrs. Mcgivern is a 66 year old woman we have had in this clinic for a long period of time with severe venous hypertension and refractory wounds on her medial lower legs and ankles bilaterally. 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 When she was off her legs for a prolonged period example when she fell and suffered a compression fracture things would heal up quite nicely. She is now retired and we managed to heal up the right medial leg wound. The left one was very tiny last time I saw this although still refractory. 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 It got to the point where this was interfering with her walking and she elected to proceed with a ankle arthrodesis to straighten her her ankle and leave her with a functional outcome for mobilization. The patient was referred to Dr. Doren Custard and really this took some time to arrange. Dr. Doren Custard saw her on 12/07/2019. Once again he verified that she had no arterial issues. She had previously had an angiogram several years ago. Follow-up ABIs on the left showed an ABI of 1.12 with triphasic waveforms and a TBI of 0.92. She is felt to have chronic deep venous insufficiency but I do not think it was felt that anything could be done from about this from an ablation point of view. At the time Dr. Doren Custard saw this patient the wounds actually look closed via the pictures in his clinic. The patient finally underwent her surgery on 12/15/2019. This went reasonably well and there was a good anatomic outcome. She developed a small distal wound dehiscence on the lateral part of the surgical wound. However more problematically she is developed recurrence of the wound on the medial left ankle. There are actually 2 wounds here one in the distal lower leg and 1 pretty much at the level of the medial malleolus. It is a more distal area that is more problematic. She has been using Hydrofera Blue which started on Friday before this she was simply Ace wrapping. There was a culture done that showed Pseudomonas and she is on  ciprofloxacin. A recent CNS on 8/11 was negative. The patient reports some pain but I generally think this is improving. She is using a cam boot completely nonweightbearing using a walker for pivot transfers and a wheelchair 8/24; not much improvement unfortunately she has a surgical wound on  the lateral part in the venous insufficiency wound medially. The bottom part of the medial insufficiency wound is still necrotic there is exposed tendon here. We have been using Hydrofera Blue under compression. Her edema control is however better 8/31; patient in for follow-up of his surgical wound on the lateral part of her left leg and chronic venous insufficiency ulcers medially. We put her back in compression last week. She comes in today with a complaint of 3 or 4 days worth of increasing pain. She felt her cam walker was rubbing on the area on the back of her heel. However there is intense erythema seems more likely she has cellulitis. She had 2 cultures done when she was seeing podiatry in the postop. One of them in late July showed Pseudomonas and she received a course of ciprofloxacin the other was negative on 8/11 she is allergic to penicillin with anaphylactoid complaints of hives oral swelling via information in epic 9/9; when I saw this patient last week she had intense anterior erythema around her wound on the right lateral heel and ankle and also into the right medial heel. Some of this was no doubt drainage and her walker boot however I was convinced she had cellulitis. I gave her Levaquin and Bactrim she is finishing up on this now. She is following up with Dr. [name] he saw her yesterday. He is taken her out of the walking boot of course she is still nonweightbearing. Her x-ray was negative for any worrisome features such as soft tissue air etc. Things are a lot better this week. She has home health. We have been using Hydrofera Blue under an [employer] which she put back on yesterday. I did not  wrap her last week 9/17; her surrounding skin looks a lot better. In fact the area on the left lateral ankle has just a scant amount of eschar. The only remaining wound is the large area on the left medial ankle. Probably about 60% of this is healthy granulation at the surface however she has a significant divot distally. This has adherent debris in it. I been using debridement and silver collagen to try and get this area to fill-in although I do not think we have made much progress this week 9/24; the patient's wound on the left medial ankle looks a lot better. The deeper divot area distally still requires debridement but this is cleaning up quite nicely we have been using silver collagen. The patient is complaining of swelling in her foot and is worried that that is contributing to the nonhealing of the ankle wound. She is also complaining of numbness in her anterior toes 10/4; left medial ankle. The small area distally still has a divot with necrotic material that I have been debriding away. This has an undermining area. She is approved for Apligraf. She saw Dr. [name] her surgeon on 10/1. I think he declared himself is satisfied with the condition of things. Still nonweightbearing till the end of the month. 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 Her surgical wound is well closed. There is no evidence of infection 10/11; the patient arrived in clinic today with the expectation that we be able to put an Apligraf on this area after debridement however she arrives with a relatively large amount of green drainage on the dressing. The patient states that this started on Friday. She has not been systemically unwell. 10/19; culture I did last week showed both Enterococcus and Pseudomonas. I  think this came in separate parts because I stopped her ciprofloxacin I gave her and prescribed her linezolid however now looking at the final culture result this is Pseudomonas which  is resistant to quinolones. She has not yet picked up the linezolid apparently phone issues. We are also trying to get a topical antibiotic out of [place] in [place] they can be applied by home health. She is still having green drainage 10/16; the patient has her topical antibiotic from [facility] in [place]. This is a compounded gel with vancomycin and ciprofloxacin and gentamicin. We are applying this on the wound bed with silver alginate over the top with Unna boot wraps. She arrives in clinic today with a lot less ominous looking drainage although she is only use this topical preparation once the second time today. She sees Dr. [name] her surgeon on Friday she has home health changing the dressing 11/2; still using her compounded topical antibiotic under silver alginate. Surface is cleaning up there is less drainage. We had an Apligraf for her today and I elected to apply it. A light coating of her antibiotic 04/25/2020 upon evaluation today patient appears to be doing well with regard to her ankle ulcer. There is a little bit of slough noted on the surface of the wound I am can have to perform sharp debridement to clear this away today. With that being said other than that fact overall I feel like she is making progress and we do see some new epithelial growth. There is also some improvement in the depth of the wound and that distal portion. There is little bit of slough there as well. 12/7; 2-week follow-up. Apligraf #3. Dimensions are smaller. Closing in especially inferiorly. Still some surface debris. Still using the [facility] topical antibiotic but I told her that I don't think this needs to be renewed 12/21; 2-week follow-up. Apligraf #4 dimensions are smaller. Nice improvement 06/05/2020; 2-week follow-up. The patient's wound on the left medial ankle looks really excellent. Nice granulation. Advancing epithelialization no undermining no evidence of infection. We would have  to reapply for another Apligraf but with the condition of this wound I did not feel strongly about it. We used Hydrofera Blue under the same degree of compression. She follows up with Dr. [name] her surgeon a week Friday 06/13/2020 upon evaluation today patient appears to be doing excellent in regard to her wound. She has been tolerating the dressing changes without complication. Fortunately there is no signs of active infection at this time. No fevers, chills, nausea, vomiting, or diarrhea. She was using Hydrofera Blue last week. 06/20/2020 06/20/2020 on evaluation today patient actually appears to be doing excellent in regard to her wound. This is measuring better and looking much better as well. She has been using the collagen that seems to be doing better for her as well even though the [facility] was and is not sticking or feeling as rough on her wound. She did see Dr. [name] on Friday he is very pleased he also stated none of the hardware has shifted. That is great news 1/27; the patient has a small clean wound all that is remaining. I agree that this is too small to really consider an Apligraf. Under illumination the surface is looking quite good. We have been using collagen although the dimensions are not any better this week 2/2; the patient has a small clean wound on the left medial ankle. Although this left of her substantial original areas. Measurements are smaller. We  have been using polymen Ag under an Haematologist. 2/10; small area on the left medial ankle. This looks clean nothing to debride however dimensions are about the same we have been using polymen I think now for 2 weeks 2/17; not much change in surface area. We have been using polymen Ag without any improvement. 3/17; 1 month follow-up. The patient has been using endoform without any improvement in fact I think this looks worse with more depth and more expansion 3/24; no improvement. Perhaps less debris on the surface. We have  been using Sorbact for 1 week 4/4; wound measures larger. She has edema in her leg and her foot which she tells as her wrap came down. We have been using Unna boots. Sorbact of the wound. She has been approved for Apligraf 09/12/2020 upon evaluation today patient appears to be doing well with regard to her wound. We did get the Apligraf reapproved which is great news we have that available for application today. Fortunately there is no signs of infection and overall the patient seems to be doing great. The wound bed is nice and clean. 4/27; patient presents for her second application of Apligraf. She states over the past week she has been on her feet more often due to being outside in her garden. She has noted more swelling to her foot as a result. She denies increased warmth, pain or erythema to the wound site. 10/10/2020 upon evaluation today patient appears to be doing well with regard to her wound which does not appear to be quite as irritated as last week from what I am hearing. With that being said unfortunately she is having issues with some erythema and warmth to touch as well as an increase size. I do believe this likely is infected. 10/17/2020 upon evaluation today patient appears to be doing excellent in regard to her wound this is significantly improved as compared to last week. Fortunately I think that the infection is much better controlled at this point. She did have evidence of both Enterococcus as well as Staphylococcus noted on culture. Enterococcus really would not be helped significantly by the Cipro but the wound is doing so much better I am under the assumption that the Staphylococcus is probably the main organism that is causing the current infection. Nonetheless I think that she is doing excellent as far as that is concerned and I am very pleased in that regard. I would therefore recommend she continue with the Cipro. 10/31/2020 upon evaluation today patient appears to be doing well  with regard to her wound. She has been tolerating the dressing changes without complication. Fortunately there is no signs of active infection and overall I am extremely pleased with where things stand today. No fevers, chills, nausea, vomiting, or diarrhea. With that being said she does have some green drainage coming from the wound and although it looks okay I am a little concerned about the possibility of a continuing infection. Specifically with Pseudomonas. For that reason I will go ahead and send in a prescription for Cipro for her to be continued. 11/14/2020 upon evaluation today patient appears to be doing very well currently in regard to her wound on her leg. She has been tolerating the dressing changes without complication. Fortunately I feel like the infection is finally under good control here. Unfortunately we do not have the Apligraf for application today although we can definitely order to have it in place for next week. That will be her fifth and final of  the current series. Nonetheless I feel like her wound is really doing quite well which is great news. 11/21/2020 upon evaluation today patient appears to be doing well with regard to her wound on the medial ankle. Fortunately I think the infection is under control and I do believe we can go ahead and reapply the Apligraf today. She is in agreement with that plan. There does not appear to be any signs of active infection at this time which is great news. No fevers, chills, nausea, vomiting, or diarrhea. 12/05/2020 upon evaluation today patient's wound bed actually showed signs of good granulation epithelization at this point. There does not appear to be any signs of infection which is great news and overall very pleased with where things stand. Overall the patient seems to be doing fairly well in my opinion with regard to her wound although I do believe she continues to build up a lot of biofilm I think she could benefit from using PuraPly  at this point. 12/12/2020 upon evaluation today patient's wound actually appears to be doing decently well today. The Unna boot has not been quite as well-tolerated so that more uncomfortable for her and even causing some pressure over the plate on the lateral portion of her foot which is 90 where the wound is. There did not appear to be any significant deep tissue injury with that there may be a minimal change in the skin noted I think that we may want to go back to the Coflex 2 layer which is a little bit easier on her skin it seems. 12/19/2020 upon evaluation today patient actually seems to be making great progress with the PuraPly currently. She in fact seems to be much better as far as the overall appearance of the wound bed is concerned I am very happy in this regard. I do not see any signs of of infection which is great news as well. No fevers, chills, nausea, vomiting, or diarrhea. 12/26/2020 upon evaluation today patient appears to actually be doing better in regard to her wound on the left medial ankle region. The surface of the wound is actually doing significantly better which is great news. There does not appear to be any signs of infection which is also great news and in general I am extremely pleased with where we stand today. 01/02/2021 upon evaluation today patient appears to be doing well with regard to her wound. In fact this is showing signs of excellent improvement and very pleased with where things stand. In fact the last 3 appointments have all shown signs of this getting smaller which is excellent news. I have not even had to perform any debridement and today is no exception. Overall I feel like this is dramatically improved compared to previous. T oday is PuraPly application #4. 3/71/6967 upon evaluation today patient appears to be doing excellent in regard to her wound this is continue to show signs of improvement and overall I am extremely pleased with where we stand today. She  is actually here for PuraPly application #5. Every time we have applied this she is noted definite improvement on measurements. 01/23/2021 upon evaluation today patient is actually making good progress in regard to her wound. This was actually on just a little bit longer this time compared to previous due to the fact that she did have to go out of town. She is actually here for PuraPly application #6. We have definitely been seeing improvements in the overall quality of the tissue on the surface  of the wound which is awesome news. In general I think that the patient seems to be continuing to make great progress here. 01/30/2021 upon evaluation today patient's wound is actually doing excellent. There is really not any significant biofilm buildup which is great news and overall I am extremely pleased with where things stand today. There does not appear to be any signs of active infection. No fevers, chills, nausea, vomiting, or diarrhea. 02/06/2021 upon evaluation today patient's wound is actually showing signs of excellent improvement which is great news. We continue to see the benefit of the PuraPly this is doing a great job the wound seems not really irritated whatsoever and is showing signs of good granulation at this point. Overall I am extremely happy with what we are seeing. The patient likewise is happy to hear all of this as well. 02/13/2021 upon evaluation today patient appears to be doing well with regard to her wound. This again is measuring smaller today and I am very pleased in this regard. Fortunately there does not appear to be any signs of active infection at this time which is good news from a systemic standpoint. Locally there is still some significant drainage which she does have is concerned about infection locally. No fevers, chills, nausea, vomiting, or diarrhea. 02/20/2021 upon evaluation today patient actually appears to be doing decently well in regard to her wound. She has been  tolerating the dressing changes without complication. I do believe the PuraPly is helping wound bed does appear to be doing better. There is no evidence of active infection locally or systemically at this point visually although on fluorescence imaging there still appear to be bacterial load and bioburden noted. 02/27/2021 upon evaluation today patient fortunately continues to show signs of improvement with use of the PuraPly currently. Subsequently we did review her culture results and to be honest I think that the Bactrim is probably the best option to have her continue at this point. For that reason I am get a go ahead and send in a refill today for her for an additional 2 weeks. Nonetheless I think that we are making excellent progress here. It was Enterococcus and Staphylococcus that were noted she is allergic to penicillin so there is not much I can do from the Enterococcus standpoint though the staff does seem to be sensitive to the Bactrim. 03/06/2021 upon evaluation today patient appears to be doing well with regard to her wound. This is definitely showing signs of improvement which is great news and overall I am extremely pleased with where things stand today. There does not appear to be any signs of active infection which is great news and overall and I do believe that we are headed in the appropriate direction I think the PuraPly is doing an awesome job for her. 03/20/2021 upon evaluation today patient actually appears to be doing well with regard to her wound. We have been using the PuraPly although last week when I was out of the office they actually just used endoform nonetheless this still seems to be doing great. Fortunately there does not appear to be any signs of active infection which is great news overall I think the patient is making wonderful progress. 03/27/2021 upon evaluation today patient appears to be doing well with regard to her wound in fact there is a little drainage but  otherwise her does not appear to be any major signs of open wounds nor infection at this time. Overall I am extremely pleased with where  things stand. No fevers, chills, nausea, vomiting, or diarrhea. 04/03/2021 upon evaluation today patient presents for reevaluation here in the clinic and overall she seems to be doing quite well. Fortunately there does not appear to be any signs of active infection which is great news and in general I am very pleased with how things appear. There is still a very small opening remaining but in general she is looking much better. Fortunately I do not think there is any need for the PuraPly at this point. 04/10/2021 upon evaluation today patient's wound is actually showing signs of excellent improvement. I am actually very pleased with where it stands today. I think she is very close to complete closure in fact this may be healed today. Either way I think that she may benefit from using her own compression socks for the next week and then will subsequently see how things stand following. 04/17/2021 upon evaluation today patient appears to be doing well with regard to her wound on the ankle region. This is actually showing signs of excellent improvement which is great news. I do not see any signs of active infection at this time which is great and no open wound. With that being said on the lateral portion of her ankle right at the base of her plate she actually has an area of pressure that occurred over the past week when she was not in the wrap or we been padding her. This does seem to be an issue which I think is good to be an ongoing problem I am can actually get in touch with Dr. [name] today to see what we can do in that regard. 04/24/2021 upon evaluation today patient appears to be doing well with regard to her ankle region for the most part although there does appear to be some fluid trapped underneath the area where we thought this was healed last week.  Unfortunately I think at this time that we can have to reopen and clean away some of this area and then subsequently depending on how things do over the next week or so we will see about discharge but right now I do not think that is the right way to go. She voiced understanding. 05/01/2021 upon evaluation today patient's wound still appears to be open unfortunately. I do believe that we do need to see what we can do about trying to get this closed I believe compression wrapping is probably can to be indicated here. Fortunately there is no signs of active infection at this time. 05/08/2021 upon evaluation today patient appears to be doing okay in regard to her ulcer this is very superficial and we will see any major issues here. The biggest problem currently that I find is that we just cannot get this area to completely seal up. She seemed to almost be and then things digressed a little bit. Right now is not too bad but we do need to see if we can make some difference here. I think maybe switching over to collagen may be better. 05/15/2021 upon evaluation today patient actually appears to be doing pretty well in regard to her wound. This is measuring a little smaller and looking much better. She did see orthopedics today there to me making her a insert for her [name] to try to help straighten out her gait. I think this can be beneficial for her as well. 05/22/2021 upon evaluation today patient appears to be doing well currently in regard to her wound although is not significantly  smaller unfortunately this at least does not show any signs of active infection at this time which is great news. No fevers, chills, nausea, vomiting, or diarrhea. I am going to see about switching over to endoform that is what we wanted to use in the past always been on backorder. 06/05/2021 upon evaluation today patient appears to be doing somewhat poorly in regard to her wound. This is really not showing the signs of improvement  that I would have like to have seen. Fortunately I do not see any signs of active infection locally nor systemically which is great news. With that being said even though I do not see the sign she is having some increased discomfort which is not good. She is also having some issues here with the wound looking a little larger today compared to where its been. With that being said overall I believe that we probably need to consider at least that there may be a subacute infection here and that is what is going on. We have tried multiple different medications and dressings which have done well for her in the past without seeing any signs of improvement. She is in agreement with addressing this as such. 06/12/2020 upon evaluation today patient's wound is actually showing some signs of improvement today. In fact I do not even see any need for sharp debridement based on what I see at this point today. Fortunately there is no evidence of active infection currently which is great news. No fevers, chills, nausea, vomiting, or diarrhea. 06/26/2021 upon evaluation today patient appears to be doing well with regard to her wounds. Fortunately there does not appear to be any signs of active infection at this time which is great news. I do believe the Bactrim is doing well for her. Fortunately there does not appear to be any signs of active infection at this time. With the hardware in place and everything else I do believe extending this for 2 additional weeks for 4 weeks total would be advisable. 07/03/2021 upon evaluation today patient appears to be doing well with regard to her wound. She has been tolerating the dressing changes which is great news. Fortunately there does not appear to be any evidence of active infection locally nor systemically at this time. 07/10/2021 upon evaluation today patient appears to be doing well with regard to her wound I do feel that the PuraPly has done an awesome job for her over the past  week. Fortunately I do not see any signs of active infection at this time. No fevers, chills, nausea, vomiting, or diarrhea. 2/15; the patient's wound is an inverted cone. Most of the walls of the colon are epithelialized however the tip of the conus is still open. I gather this was considered to be quite an improvement.We reapplied Puraply in the standard fashion 07/24/2021 upon evaluation patient appears to be doing well with regard to her wound. She has been tolerating the dressing changes without complication which is great news. There is no signs of active infection at this time. 07/31/2021 upon evaluation today patient's wound is actually showing signs of improvement which is good news. Fortunately I do not see any evidence of active infection locally nor systemically at this time which is great news. With that being said the biggest issue that I see is simply that she seems to have drainage that dries over the surface of the wound not allowing it to heal appropriately. That something that I think we need to work on to try  to get this to fully closed. Objective Constitutional Well-nourished and well-hydrated in no acute distress. Vitals Time Taken: 10:10 AM, Height: 68 in, Temperature: 98.1 F, Pulse: 65 bpm, Respiratory Rate: 18 breaths/min, Blood Pressure: 101/63 mmHg. Respiratory normal breathing without difficulty. Psychiatric this patient is able to make decisions and demonstrates good insight into disease process. Alert and Oriented x 3. pleasant and cooperative. General Notes: Upon inspection patient's wound bed actually showed signs of good granulation and epithelization at this point. Fortunately I do not see any evidence of active infection locally or systemically at this time which is great news and overall very pleased in that regard. Integumentary (Hair, Skin) Wound #15R status is Open. Original cause of wound was Gradually Appeared. The date acquired was: 12/30/2019. The wound has  been in treatment 80 weeks. The wound is located on the Left,Medial Malleolus. The wound measures 0.2cm length x 0.2cm width x 0.1cm depth; 0.031cm^2 area and 0.003cm^3 volume. There is Fat Layer (Subcutaneous Tissue) exposed. There is no tunneling or undermining noted. There is a none present amount of drainage noted. The wound margin is distinct with the outline attached to the wound base. There is large (67-100%) red granulation within the wound bed. There is no necrotic tissue within the wound bed. 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 There was a Selective/Open Wound Skin/Epidermis Debridement with a total area of 0.16 sq cm performed by [name], PA. With the following instrument(s): Curette to remove Non-Viable tissue/material. Material removed includes Skin: Dermis, Skin: Epidermis, and Fibrin/Exudate after achieving pain control using Lidocaine 4% T opical Solution. A time out was conducted at 10:25, prior to the start of the procedure. There was no bleeding. The procedure was tolerated well with a pain level of 0 throughout and a pain level of 0 following the procedure. Post Debridement Measurements: 0.2cm length x 0.2cm width x 0.1cm depth; 0.003cm^3 volume. Character of Wound/Ulcer Post Debridement is improved. Severity of Tissue Post Debridement is: Limited to breakdown of skin. Post procedure Diagnosis Wound #15R: Same as Pre-Procedure Pre-procedure diagnosis of Wound #15R is a Venous Leg Ulcer located on the Left,Medial Malleolus . There was a  Double Layer Compression Therapy Procedure by Deon Pilling, RN. Post procedure Diagnosis Wound #15R: Same as Pre-Procedure Plan Follow-up Appointments: Return Appointment in 1 week. Margarita Grizzle and [place], Wednesday Room 8 Cellular or Tissue Based Products: Cellular or Tissue Based Product Type: - Puraply AM #2 07/10/2021 Puraply AM for #3 07/17/2021 Puraply AM for #4 07/24/2021 Bathing/ Shower/ Hygiene: May shower with protection but do not get wound dressing(s) wet. Edema Control - Lymphedema / SCD / Other: Elevate legs to the level of the heart or above for 30 minutes daily and/or when sitting, a frequency of: - throughout the day when sitting Avoid standing for long periods of time. Patient to wear own compression stockings every day. Exercise regularly Moisturize legs daily. Non Wound Condition: Apply the following to affected area as directed: - Apply silicone bordered foam to left lateral foot for protection WOUND #15R: - Malleolus Wound Laterality: Left, Medial Cleanser: Wound Cleanser (Generic) 1 x Per Day/15 Days Discharge  Instructions: Cleanse the wound with wound cleanser prior to applying a clean dressing using gauze sponges, not tissue or cotton balls. Peri-Wound Care: Sween Lotion (Moisturizing lotion) 1 x Per Day/15 Days Discharge Instructions: Apply moisturizing lotion as directed Prim Dressing: Xeroform Occlusive Gauze Dressing, 4x4 in 1 x Per Day/15 Days ary Discharge Instructions: Apply to wound bed as instructed Secondary Dressing: Woven Gauze Sponge, Non-Sterile 4x4 in (Generic) 1 x Per Day/15 Days Discharge Instructions: Apply over primary dressing as directed. Secondary Dressing: ABD Pad, 8x10 1 x Per Day/15 Days Discharge Instructions: Apply over primary dressing as directed. Com pression Wrap: CoFlex TLC XL 2-layer Compression System 4x7 (in/yd) 1 x Per Day/15 Days Discharge Instructions: Apply CoFlex 2-layer compression as directed. (alt for 4 layer) 1. Would  recommend that we going continue with the wound care measures as before and the patient is in agreement with plan this includes the use of the compression wrapping or using the Coflex 2 layer compression wrap. 2. I am also can recommend at this time that we have the patient continue to monitor for any signs of infection. Update if anything changes she should let me know as soon as possible. We will be using Xeroform to try to see if we keep this from getting too dry and hopefully allowing this to heal in the interim. We will see patient back for reevaluation in 1 week here in the clinic. If anything worsens or changes patient will contact our office for additional recommendations. Electronic Signature(s) Signed: 07/31/2021 11:04:50 AM By: [name] PA-C Entered By: [name] on 07/31/2021 11:04:49 -------------------------------------------------------------------------------- SuperBill Details Patient Name: Date of Service: [facility] [name], [name] 07/31/2021 Medical Record Number: [id number] Patient Account Number: [id number] Date of Birth/Sex: Treating RN: [birth year] (66 y.o. [name], Meta.Reding Primary Care Provider: [name] Other Clinician: Referring Provider: Treating Provider/Extender: [name] in Treatment: 74 Diagnosis Coding ICD-10 Codes Code Description [phone] Chronic venous hypertension (idiopathic) with ulcer and inflammation of left lower extremity L97.828 Non-pressure chronic ulcer of other part of left lower leg with other specified severity L97.328 Non-pressure chronic ulcer of left ankle with other specified severity Facility Procedures CPT4 Code: [id number] I Description: 349 - DEBRIDE WOUND 1ST 20 SQ CM OR < CD-10 Diagnosis Description L97.828 Non-pressure chronic ulcer of other part of left lower leg with other specified severit Modifier: 1 y Quantity: Physician Procedures : CPT4 Code Description Modifier [id number] - WC PHYS  DEBR WO ANESTH 20 SQ CM ICD-10 Diagnosis Description L97.828 Non-pressure chronic ulcer of other part of left lower leg with other specified severity Quantity: 1 Electronic Signature(s) Signed: 07/31/2021 11:04:59 AM By: [name] PA-C Entered By: [name] on 07/31/2021 11:04:58

## 2021-08-01 NOTE — Progress Notes (Signed)
[name], [name] ([id number]) Visit Report for 07/31/2021 Arrival Information Details Patient Name: Date of Service: [name], [name] 07/31/2021 10:15 A M Medical Record Number: [id number] Patient Account Number: [id number] Date of Birth/Sex: Treating RN: Nov 29, 1955 (66 y.o. F) Primary Care Falana Clagg: [name] Other Clinician: Referring Lahna Nath: Treating Ernesha Ramone/Extender: [name] in Treatment: 71 Visit Information History Since Last Visit Added or deleted any medications: No Patient Arrived: Cane Any new allergies or adverse reactions: No Arrival Time: 10:09 Had a fall or experienced change in No Accompanied By: self activities of daily living that may affect Transfer Assistance: None risk of falls: Patient Identification Verified: Yes Signs or symptoms of abuse/neglect since last visito No Secondary Verification Process Completed: Yes Hospitalized since last visit: No Patient Requires Transmission-Based Precautions: No Implantable device outside of the clinic excluding No Patient Has Alerts: Yes cellular tissue based products placed in the center Patient Alerts: L ABI =1.12, TBI = .92 since last visit: R ABI= 1.02, TBI= .58 Has Dressing in Place as Prescribed: Yes Pain Present Now: Yes Electronic Signature(s) Signed: 08/01/2021 9:21:53 AM By: [name] Entered By: [name] on 07/31/2021 10:10:20 -------------------------------------------------------------------------------- Compression Therapy Details Patient Name: Date of Service: [name], [name] 07/31/2021 10:15 A M Medical Record Number: [id number] Patient Account Number: [id number] Date of Birth/Sex: Treating RN: 1955/09/06 (66 y.o. [name] Primary Care Graciano Batson: [name] Other Clinician: Referring Byrdie Miyazaki: Treating Lilliauna Van/Extender: [name] in Treatment: 3 Compression Therapy Performed for Wound Assessment: Wound #15R  Left,Medial Malleolus Performed By: Clinician Deon Pilling, RN Compression Type: Double Layer Post Procedure Diagnosis Same as Pre-procedure Electronic Signature(s) Signed: 07/31/2021 4:00:28 PM By: Deon Pilling RN, BSN Entered By: Deon Pilling on 07/31/2021 10:30:08 -------------------------------------------------------------------------------- Encounter Discharge Information Details Patient Name: Date of Service: [name], [name] 07/31/2021 10:15 A M Medical Record Number: [id number] Patient Account Number: [id number] Date of Birth/Sex: Treating RN: 02/27/1956 (67 y.o. [name] Primary Care Harlan Ervine: [name] Other Clinician: Referring Madeleyn Schwimmer: Treating Chamaine Stankus/Extender: [name] in Treatment: 64 Encounter Discharge Information Items Post Procedure Vitals Discharge Condition: Stable Temperature (F): 98.1 Ambulatory Status: Cane Pulse (bpm): 65 Discharge Destination: Home Respiratory Rate (breaths/min): 20 Transportation: Private Auto Blood Pressure (mmHg): 101/63 Accompanied By: self Schedule Follow-up Appointment: Yes Clinical Summary of Care: Electronic Signature(s) Signed: 07/31/2021 4:00:28 PM By: Deon Pilling RN, BSN Entered By: Deon Pilling on 07/31/2021 10:36:01 -------------------------------------------------------------------------------- Lower Extremity Assessment Details Patient Name: Date of Service: [address] [facility] [name], [name] 07/31/2021 10:15 A M Medical Record Number: [id number] Patient Account Number: [id number] Date of Birth/Sex: Treating RN: 1956/02/21 (66 y.o. [name] Primary Care Krishan Mcbreen: [name] Other Clinician: Referring Jayvon Mounger: Treating Ladena Jacquez/Extender: [name] Weeks in Treatment: 80 Edema Assessment Assessed: [Left: Yes] [Right: No] Edema: [Left: N] [Right: o] Calf Left: Right: Point of Measurement: 31 cm From Medial Instep 29 cm Ankle Left: Right: Point  of Measurement: 11 cm From Medial Instep 20.8 cm Vascular Assessment Pulses: Dorsalis Pedis Palpable: [Left:Yes] Electronic Signature(s) Signed: 07/31/2021 4:00:28 PM By: Deon Pilling RN, BSN Entered By: Deon Pilling on 07/31/2021 10:26:45 -------------------------------------------------------------------------------- [place] Details Patient Name: Date of Service: [name], [name] 07/31/2021 10:15 A M Medical Record Number: [id number] Patient Account Number: [id number] Date of Birth/Sex: Treating RN: April 12, 1956 (66 y.o. [name] Primary Care Hanaa Payes: [name] Other Clinician: Referring Alechia Lezama: Treating Blanche Scovell/Extender:  Stone III, Dereck Ligas Weeks in Treatment: 48 Multidisciplinary Care Plan reviewed with physician Active Inactive Venous Leg Ulcer Nursing Diagnoses: Actual venous Insuffiency (use after diagnosis is confirmed) Knowledge deficit related to disease process and management Goals: Patient will maintain optimal edema control Date Initiated: 06/20/2020 Target Resolution Date: 08/08/2021 Goal Status: Active Interventions: Assess peripheral edema status every visit. Compression as ordered Treatment Activities: Therapeutic compression applied : 06/20/2020 Notes: 01/30/21: Edema control ongoing, using CoFlex wrap. 06/05/21: Edema control continues Wound/Skin Impairment Nursing Diagnoses: Impaired tissue integrity Knowledge deficit related to ulceration/compromised skin integrity Goals: Patient/caregiver will verbalize understanding of skin care regimen Date Initiated: 01/17/2020 Target Resolution Date: 08/08/2021 Goal Status: Active Ulcer/skin breakdown will have a volume reduction of 30% by week 4 Date Initiated: 01/17/2020 Date Inactivated: 03/12/2020 Target Resolution Date: 03/09/2020 Goal Status: Met Interventions: Assess patient/caregiver ability to obtain necessary supplies Assess patient/caregiver ability to  perform ulcer/skin care regimen upon admission and as needed Assess ulceration(s) every visit Provide education on ulcer and skin care Notes: 01/30/21: Wound care regimen continues, PuraPly skin sub being used. 04/17/21: Wound healed 06/05/21: Wound re-opened, wound care regimen continues. Electronic Signature(s) Signed: 07/31/2021 4:00:28 PM By: Deon Pilling RN, BSN Entered By: Deon Pilling on 07/31/2021 10:34:12 -------------------------------------------------------------------------------- Pain Assessment Details Patient Name: Date of Service: [name], [name] 07/31/2021 10:15 A M Medical Record Number: [id number] Patient Account Number: [id number] Date of Birth/Sex: Treating RN: September 13, 1955 (66 y.o. F) Primary Care Rida Loudin: [name] Other Clinician: Referring Adryan Shin: Treating Linsey Arteaga/Extender: [name] in Treatment: 84 Active Problems Location of Pain Severity and Description of Pain Patient Has Paino Yes Site Locations Rate the pain. Current Pain Level: 2 Pain Management and Medication Current Pain Management: Electronic Signature(s) Signed: 08/01/2021 9:21:53 AM By: [name] Entered By: [name] on 07/31/2021 10:11:26 -------------------------------------------------------------------------------- Patient/Caregiver Education Details Patient Name: Date of Service: [name], [name] 3/1/2023andnbsp10:15 A M Medical Record Number: [id number] Patient Account Number: [id number] Date of Birth/Gender: Treating RN: 04/22/1956 (66 y.o. [name] Primary Care Physician: [name] Other Clinician: Referring Physician: Treating Physician/Extender: [name] in Treatment: 37 Education Assessment Education Provided To: Patient Education Topics Provided Wound/Skin Impairment: Handouts: Skin Care Do's and Dont's Methods: Explain/Verbal Responses: Reinforcements needed Electronic  Signature(s) Signed: 07/31/2021 4:00:28 PM By: Deon Pilling RN, BSN Entered By: Deon Pilling on 07/31/2021 10:34:30 -------------------------------------------------------------------------------- Wound Assessment Details Patient Name: Date of Service: [name], [name] 07/31/2021 10:15 A M Medical Record Number: [id number] Patient Account Number: [id number] Date of Birth/Sex: Treating RN: 1955-11-16 (66 y.o. [name], [name] Primary Care Tige Meas: [name] Other Clinician: Referring Ishmael Berkovich: Treating Fleet Higham/Extender: [name] in Treatment: 39 Wound Status Wound Number: 15R Primary Venous Leg Ulcer Etiology: Wound Location: Left, Medial Malleolus Wound Status: Open Wounding Event: Gradually Appeared Comorbid Peripheral Venous Disease, Osteoarthritis, Confinement Date Acquired: 12/30/2019 History: Anxiety Weeks Of Treatment: 80 Clustered Wound: No Photos Wound Measurements Length: (cm) 0.2 Width: (cm) 0.2 Depth: (cm) 0.1 Area: (cm) 0.031 Volume: (cm) 0.003 % Reduction in Area: 99.7% % Reduction in Volume: 100% Epithelialization: Large (67-100%) Tunneling: No Undermining: No Wound Description Classification: Full Thickness With Exposed Support Structures Wound Margin: Distinct, outline attached Exudate Amount: None Present Foul Odor After Cleansing: No Slough/Fibrino No Wound Bed Granulation Amount: Large (67-100%) Exposed Structure Granulation Quality: Red Fascia Exposed: No Necrotic Amount: None Present (0%) Fat Layer (Subcutaneous Tissue) Exposed: Yes Tendon Exposed: No  Muscle Exposed: No Joint Exposed: No Bone Exposed: No Treatment Notes Wound #15R (Malleolus) Wound Laterality: Left, Medial Cleanser Wound Cleanser Discharge Instruction: Cleanse the wound with wound cleanser prior to applying a clean dressing using gauze sponges, not tissue or cotton balls. Peri-Wound Care Sween Lotion (Moisturizing  lotion) Discharge Instruction: Apply moisturizing lotion as directed Topical Primary Dressing Xeroform Occlusive Gauze Dressing, 4x4 in Discharge Instruction: Apply to wound bed as instructed Secondary Dressing Woven Gauze Sponge, Non-Sterile 4x4 in Discharge Instruction: Apply over primary dressing as directed. ABD Pad, 8x10 Discharge Instruction: Apply over primary dressing as directed. Secured With Compression Wrap CoFlex TLC XL 2-layer Compression System 4x7 (in/yd) Discharge Instruction: Apply CoFlex 2-layer compression as directed. (alt for 4 layer) Compression Stockings Add-Ons Electronic Signature(s) Signed: 07/31/2021 4:00:28 PM By: Deon Pilling RN, BSN Entered By: Deon Pilling on 07/31/2021 10:29:28 -------------------------------------------------------------------------------- Vitals Details Patient Name: Date of Service: [name], [name] 07/31/2021 10:15 A M Medical Record Number: [id number] Patient Account Number: [id number] Date of Birth/Sex: Treating RN: 1956/01/02 (65 y.o. F) Primary Care Jeanice Dempsey: [name] Other Clinician: Referring Mayetta Castleman: Treating Ormand Senn/Extender: [name] in Treatment: 69 Vital Signs Time Taken: 10:10 Temperature (F): 98.1 Height (in): 68 Pulse (bpm): 65 Respiratory Rate (breaths/min): 18 Blood Pressure (mmHg): 101/63 Reference Range: 80 - 120 mg / dl Electronic Signature(s) Signed: 08/01/2021 9:21:53 AM By: [name] Entered By: [name] on 07/31/2021 10:11:13

## 2021-08-07 ENCOUNTER — Encounter (HOSPITAL_BASED_OUTPATIENT_CLINIC_OR_DEPARTMENT_OTHER): Payer: Medicare Other | Admitting: Physician Assistant

## 2021-08-07 ENCOUNTER — Other Ambulatory Visit: Payer: Self-pay

## 2021-08-07 DIAGNOSIS — I87332 Chronic venous hypertension (idiopathic) with ulcer and inflammation of left lower extremity: Secondary | ICD-10-CM | POA: Diagnosis not present

## 2021-08-07 NOTE — Progress Notes (Addendum)
[name], [name] ([id number]) Visit Report for 08/07/2021 Chief Complaint Document Details Patient Name: Date of Service: [facility] [name], [name] 08/07/2021 9:30 A M Medical Record Number: [id number] Patient Account Number: [id number] Date of Birth/Sex: Treating RN: 1955-10-06 (66 y.o. F) Primary Care Provider: [name] Other Clinician: Referring Provider: Treating Provider/Extender: [name] in Treatment: 51 Information Obtained from: Patient Chief Complaint patient is been followed long-term in this clinic for venous insufficiency ulcers with inflammation, hypertension and ulceration over the medial ankle bilaterally. 01/17/2020; this is a patient who is here for review of postoperative wounds on the left lateral ankle and recurrence of venous stasis ulceration on the left medial Electronic Signature(s) Signed: 08/07/2021 10:00:28 AM By: [name] PA-C Entered By: [name] on 08/07/2021 10:00:28 -------------------------------------------------------------------------------- Debridement Details Patient Name: Date of Service: [facility] [name], [name] 08/07/2021 9:30 A M Medical Record Number: [id number] Patient Account Number: [id number] Date of Birth/Sex: Treating RN: [birth year] (66 y.o. [name] Primary Care Provider: [name] Other Clinician: Referring Provider: Treating Provider/Extender: [name] in Treatment: 79 Debridement Performed for Assessment: Wound #15R Left,Medial Malleolus Performed By: Physician [name], PA Debridement Type: Debridement Severity of Tissue Pre Debridement: Fat layer exposed Level of Consciousness (Pre-procedure): Awake and Alert Pre-procedure Verification/Time Out Yes - 10:03 Taken: Start Time: 10:04 Pain Control: Lidocaine 4% T opical Solution T Area Debrided (L x W): otal 0.2 (cm) x 0.2 (cm) = 0.04 (cm) Tissue and other material debrided: Non-Viable, Skin:  Dermis Level: Skin/Dermis Debridement Description: Selective/Open Wound Instrument: Curette Bleeding: Minimum Hemostasis Achieved: Pressure End Time: 10:08 Response to Treatment: Procedure was tolerated well Level of Consciousness (Post- Awake and Alert procedure): Post Debridement Measurements of Total Wound Length: (cm) 0.2 Width: (cm) 0.2 Depth: (cm) 0.1 Volume: (cm) 0.003 Character of Wound/Ulcer Post Debridement: Stable Severity of Tissue Post Debridement: Fat layer exposed Post Procedure Diagnosis Same as Pre-procedure Electronic Signature(s) Signed: 08/07/2021 3:53:22 PM By: [name] PA-C Signed: 08/07/2021 4:30:06 PM By: [name] Entered By: [name] on 08/07/2021 10:10:15 -------------------------------------------------------------------------------- HPI Details Patient Name: Date of Service: [name], [name] 08/07/2021 9:30 A M Medical Record Number: [id number] Patient Account Number: [id number] Date of Birth/Sex: Treating RN: 12/31/1955 (66 y.o. F) Primary Care Provider: [name] Other Clinician: Referring Provider: Treating Provider/Extender: [name] in Treatment: 68 History of Present Illness HPI Description: the remaining wound is over the left medial ankle. Similar wound over the right medial ankle healed largely with use of Apligraf. Most recently we have been using Hydrofera Blue over this wound with considerable improvement. The patient has been extensively worked up in the past for her venous insufficiency and she is not a candidate for antireflux surgery although I have none of the details available currently. 08/24/14; considerable improvement today. About 50% of this wound areas now epithelialized. The base of the wound appears to be healthier granulation.as opposed to last week when she had deteriorated a considerable improvement 08/17/14; unfortunately the wound has regressed somewhat. The areas of  epithelialization from the superior aspect are not nearly as healthy as they were last week. The patient thinks her Hydrofera Blue slipped. 09/07/14; unfortunately the area has markedly regressed in the 2 weeks since I've seen this. There is an odor surrounding erythema. The healthy granulation tissue that we had at the base of the wound now is a dusky color. The nurse  reports green drainage 09/14/14; the area looks somewhat better than last week. There is less erythema and less drainage. The culture I did did not show any growth. Nevertheless I think it is better to continue the Cipro and doxycycline for a further week. The remaining wound area was debridement. 09/21/14. Wound did not require debridement last week. Still less erythema and less drainage. She can complete her antibiotics. The areas of epithelialization in the superior aspect of the wound do not look as healthy as they did some weeks ago 10/05/14 continued improvement in the condition of this wound. There is advancing epithelialization. Less aggressive debridement required 10/19/14 continued improvement in the condition and volume of this wound. Less aggressive debridement to the inferior part of this to remove surface slough and fibrinous eschar 11/02/14 no debridement is required. The surface granulation appears healthy although some of her islands of epithelialization seem to have regressed. No evidence of infection 11/16/14; lites surface debridement done of surface eschar. The wound does not look to be unhealthy. No evidence of infection. Unfortunately the patient has had podiatry issues in the right foot and for some reason has redeveloped small surface ulcerations in the medial right ankle. Her original presentation involved wounds in this area 11/23/14 no debridement. The area on the right ankle has enlarged. The left ankle wound appears stable in terms of the surface although there is periwound inflammation. There has been regression in  the amount of new skin 11/30/14 no debridement. Both wound areas appear healthy. There was no evidence of infection. The the new area on the right medial ankle has enlarged although that both the surfaces appear to be stable. 12/07/14; Debridement of the right medial ankle wound. No no debridement was done on the left. 12/14/14 no major change in and now bilateral medial ankle wounds. Both of these are very painful but the no overt evidence of infection. She has had previous venous ablation 12/21/14; patient states that her right medial ankle wound is considerably more painful last week than usual. Her left is also somewhat painful. She could not tolerate debridement. The right medial ankle wound has fibrinous surface eschar 12/28/14 this is a patient with severe bilateral venous insufficiency ulcers. For a considerable period of time we actually had the one on the right medial ankle healed however this recently opened up again in June. The left medial ankle wound has been a refractory area with some absent flows. We had some success with Hydrofera Blue on this area and it literally closed by 50% however it is recently opened up Foley. Both of these were debridement today of surface eschar. She tolerates this poorly 01/25/15: No change in the status of this. Thick adherent escar. Very poor tolerance of any attempt at debridement. I had healed the right medial malleolus wound for a considerable amount of time and had the left one down to about 50% of the volume although this is totally regressed over the last 48 weeks. Further the right leg has reopened. she is trying to make a appointment with pain and vascular, previous ablations with Dr. Aleda Grana. I do not believe there is an arterial insufficiency issue here 02/01/15 the status of the adherent eschar bilaterally is actually improved. No debridement was done. She did not manage to get vascular studies done 02/08/15 continued debridement of the area was  done today. The slough is less adherent and comes off with less pressure. There is no surrounding infection peripheral pulses are intact 02/15/15 selective debridement with  a disposable curette. Again the slough is less adherent and comes off with less difficulty. No surrounding infection peripheral pulses are intact. 02/22/15 selective debridement of the right medial ankle wound. Slough comes off with less difficulty. No obvious surrounding infection peripheral pulses are intact I did not debridement the one on the left. Both of these are stable to improved 03/01/15 selective debridement of both wound areas using a curette to. Adherent slough cup soft with less difficulty. No obvious surrounding infection. The patient tells me that 2 days ago she noted a rash above the right leg wrap. She did not have this on her lower legs when she change this over she arrives with widespread left greater than right almost folliculitis-looking rash which is extremely pruritic. I don't see anything to culture here. There is no rash on the rest of her body. She feels well systemically. 03/08/15; selective debridement of both wounds using a curette. Base of this does not look unhealthy. She had limegreen drainage coming out of the left leg wound and describes a lot of drainage. The rash on her left leg looks improved to. No cultures were done. 03/22/15; patient was not here last week. Basal wounds does not look healthy and there is no surrounding erythema. No drainage. There is still a rash on the left leg that almost looks vasculitic however it is clearly limited to the top of where the wrap would be. 04/05/15; on the right required a surgical debridement of surface eschar and necrotic subcutaneous tissue. I did not debridement the area on the left. These continue to be large open wounds that are not changing that much. We were successful at one point in healing the area on the right, and at the same time the area on the  left was roughly half the size of current measurements. I think a lot of the deterioration has to do with the prolonged time the patient is on her feet at work 04/19/15 I attempted-like surface debridement bilaterally she does not tolerate this. She tells me that she was in allergic care yesterday with extreme pain over her left lateral malleolus/ankle and was told that she has an "sprain" 05/03/15; large bilateral venous insufficiency wounds over the medial malleolus/medial aspect of her ankles. She complains of copious amounts of drainage and his usual large amounts of pain. There is some increasing erythema around the wound on the right extending into the medial aspect of her foot to. historically she came in with these wounds the right one healed and the left one came down to roughly half its current size however the right one is reopened and the left is expanded. 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 05/10/15 large bilateral venous insufficiency wounds. There is less adherence surface left however the surface culture that I did last week grew pseudomonas therefore bilateral selective debridement score necessary. There is surrounding erythema. The patient describes severe bilateral drainage and a lot of pain in the left ankle. Apparently her podiatrist was were ready to do a cortisone shot 05/17/15; the patient complains of pain and again copious amounts of drainage. 05/24/15; we used Iodo flex last week. Patient notes considerable improvement in wound drainage. Only needed to change this once. 05/31/15; we continued Iodoflex; the base of these large wounds bilaterally is not too bad but there is probably likely a significant bioburden here. I would like to debridement just doesn't tolerate it. 06/06/14  I would like to continue the Iodoflex although she still hasn't managed to obtain supplies. She has bilateral medial malleoli or large wounds  which are mostly superficial. Both of them are covered circumferentially with some nonviable fibrinous slough although she tolerates debridement very poorly. She apparently has an appointment for an ablation on the right leg by interventional radiology. 06/14/15; the patient arrives with the wounds and static condition. We attempted a debridement although she does not do well with this secondary to pain. I 07/05/15; wounds are not much smaller however there appears to be a cleaner granulating base. The left has tight fibrinous slough greater than the right. Debridement is tolerated poorly due to pain. Iodoflex is done more for these wounds in any of the multitude of different dressings I have tried on the left 1 and then subsequently the right. 07/12/15; no change in the condition of this wound. I am able to do an aggressive debridement on the right but not the left. She simply cannot tolerate it. We have been using Iodoflex which helps somewhat. It is worthwhile remembering that at one point we healed the right medial ankle wound and the left was about 25% of the current circumference. We have suggested returning to vascular surgery for review of possible further ablations for one reason or another she has not been able to do this. 07/26/15 no major change in the condition of either wound on her medial ankle. I did not attempt to debridement of these. She has been aggressively scrubbing these while she is in the shower at home. She has her supply of Iodoflex which seems to have done more for these wounds then anything I have put on recently. 08/09/15 wound area appears larger although not verified by measurements. Using Iodoflex 09/05/2015 -- she was here for avisit today but had significant problems with the wound and I was asked to see her for a physician opinion. I have summarize that this lady has had surgery on her left lower extremity about 10 years ago where the possible veins stripping was done. She  has had an opinion from interventional radiology around November 2016 where no further sclerotherapy was ordered. The patient works 12 hours a day and stands on a concrete floor with work boots and is unable to get the proper compression she requires and cannot elevate her limbs appropriately at any given time. She has recently grown Pseudomonas from her wound culture but has not started her ciprofloxacin which was called in for her. 09/13/15 this continues to be a difficult situation for this patient. At one point I had this wound down to a 1.5 x 1.5" wound on her left leg. This is deteriorated and the right leg has reopened. She now has substantial wounds on her medial calcaneus, malleoli and into her lower leg. One on the left has surface eschar but these are far too painful for me to debridement here. She has a vascular surgery appointment next week to see if anything can be done to help here. I think she has had previous ablations several years ago at [facility]. She has no major edema. She tells me that she did not get product last time [facility]] and went for several days without it. She continues to work in work boots 12 hours a day. She cannot get compression/4-layer under her work boots. 09/20/15 no major change. Periwound edema control was not very good. Her point with pain and vascular is next Wednesday the 25th 09/28/15; the patient  is seen vascular surgery and is apparently scheduled for repeat duplex ultrasounds of her bilateral lower legs next week. 10/05/15; the patient was seen by Dr. Doren Custard of vascular surgery. He feels that she should have arterial insufficiency excluded as cause/contributed to her nonhealing stage she is therefore booked for an arteriogram. She has apparently monophasic signals in the dorsalis pedis pulses. She also of course has known severe chronic venous insufficiency with previous procedures as noted previously. I had another long discussion with the patient  today about her continuing to work 12 hour shifts. I've written her out for 2 months area had concerns about this as her work location is currently undergoing significant turmoil and this may lead to her termination. She is aware of this however I agree with her that she simply cannot continue to stand for 12 hours multiple days a week with the substantial wound areas she has. 10/19/15; the Dr. Doren Custard appointment was largely for an arteriogram which was normal. She does not have an arterial issue. He didn't make a comment about her chronic venous insufficiency for which she has had previous ablations. Presumably it was not felt that anything additional could be done. The patient is now out of work as I prescribed 2 weeks ago. Her wounds look somewhat less aggravated presumably because of this. I felt I would give debridement another try today 10/25/15; no major change in this patient's wounds. We are struggling to get her product that she can afford into her own home through her insurance. 11/01/15; no major change in the patient's wounds. I have been using silver alginate as the most affordable product. I spoke to Dr. Marla Roe last week with her requested take her to the OR for surgical debridement and placement of ACEL. Dr. Marla Roe told me that she would be willing to do this however [facility] will not cover this, fortunately the patient has [place] healthcare of some variant 11/08/15; no major change in the patient's wounds. She has been completely nonviable surface that this but is in too much pain with any attempted debridement are clinic. I have arranged for her to see Dr. Marla Roe ham of plastic surgery and this appointment is on Monday. I am hopeful that they will take her to the OR for debridement, possible ACEL ultimately possible skin graft 11/22/15 no major change in the patient's wounds over her bilateral medial calcaneus medial malleolus into the lower legs. Surface on these  does not look too bad however on the left there is surrounding erythema and tenderness. This may be cellulitis or could him sleepy tinea. 11/29/15; no major changes in the patient's wounds over her bilateral medial malleolus. There is no infection here and I don't think any additional antibiotics are necessary. There is now plan to move forward. She sees Dr. Marla Roe in a week's time for preparation for operative debridement and ACEL placement I believe on 7/12. She then has a follow-up appointment with Dr. Marla Roe on 7/21 12/28/15; the patient returns today having been taken to the [facility] by Dr. Marla Roe 12/12/15 she underwent debridement, intraoperative cultures [which were negative]. She had placement of a wound VAC. Parent really ACEL was not available to be placed. The wound VAC foam apparently adhered to the wound since then she's been using silver alginate, Xeroform under Ace wraps. She still says there is a lot of drainage and a lot of pain 01/31/16; this is a patient I see monthly. I had referred her to Dr. Marla Roe him of  plastic surgery for large wounds on her bilateral medial ankles. She has been to the OR twice once in early July and once in early August. She tells me over the last 3 weeks she has been using the wound VAC with ACEL underneath it. On the right we've simply been using silver alginate. Under Kerlix Coban wraps. 02/28/16; this is a patient I'm currently seeing monthly. She is gone on to have a skin graft over her large venous insufficiency ulcer on the left medial ankle. This was done by Dr. Marla Roe him. The patient is a bit perturbed about why she didn't have one on her right medial ankle wound. She has been using silver alginate to this. 03/06/16; I received a phone call from her plastic surgery Dr. Marla Roe. She expressed some concern about the viability of the skin graft she did on the left medial ankle wound. Asked me to place Endoform on this. She told me she is not  planning to do a subsequent skin graft on the right as the left one did not take very well. I had placed Hydrofera Blue on the right 03/13/16; continue to have a reasonably healthy wound on the right medial ankle. Down to 3 mm in terms of size. There is epithelialization here. The area on the left medial ankle is her skin graft site. I suppose the last week this looks somewhat better. She has an open area inferiorly however in the center there appears to be some viable tissue. There is a lot of surface callus and eschar that will eventually need to come off however none of this looked to be infected. Patient states that the is able to keep the dressing on for several days which is an improvement. 03/20/16 no major change in the circumference of either wound however on the left side the patient was at Dr. Eusebio Friendly office and they did a debridement of left wound. 50% of the wound seems to be epithelialized. I been using Endoform on the left Hydrofera Blue in the right 03/27/16; she arrives today with her wound is not looking as healthy as they did last week. The area on the right clearly has an adherent surface to this a very similar surface on the left. Unfortunately for this patient this is all too familiar problem. Clearly the Endoform is not working and will need to change that today that has some potential to help this surface. She does not tolerate debridement in this clinic very well. She is changing the dressing wants 04/03/16; patient arrives with the wounds looking somewhat better especially on the right. Dr. Migdalia Dk change the dressing to silver alginate when she saw her on Monday and also sold her some compression socks. The usefulness of the latter is really not clear and woman with severely draining wounds. 04/10/16; the patient is doing a bit of an experiment wearing the compression stockings that Dr. Migdalia Dk provided her to her left leg and the out of legs based dressings that we provided  to the right. 05/01/16; the patient is continuing to wear compression stockings Dr. Migdalia Dk provided her on the left that are apparently silver impregnated. She has been using Iodoflex to the right leg wound. Still a moderate amount of drainage, when she leaves here the wraps only last for 4 days. She has to change the stocking on the left leg every night 05/15/16; she is now using compression stockings bilaterally provided by Dr. Marla Roe. She is wearing a nonadherent layer over the wounds so really I  don't think there is anything specific being done to this now. She has some reduction on the left wound. The right is stable. I think all healing here is being done without a specific dressing 06/09/16; patient arrives here today with not much change in the wound certainly in diameter to large circular wounds over the medial aspect of her ankle bilaterally. Under the light of these services are certainly not viable for healing. There is no evidence of surrounding infection. She is wearing compression stockings with some sort of silver impregnation as prescribed by Dr. Marla Roe. She has a follow-up with her tomorrow. 06/30/16; no major change in the size or condition of her wounds. These are still probably covered with a nonviable surface. She is using only her purchase stockings. She did see Dr. Marla Roe who seemed to want to apply Dakin's solution to this I'm not extreme short what value this would be. I would suggest Iodoflex which she still has at home. 07/28/16; I follow Mrs. Neuroth episodically along with Dr. Marla Roe. 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 She has been applying a topical collagen ointment to both wounds with Adaptic. I don't think Dr. Marla Roe is planning to take her back to the OR. 08/19/16; I follow Mrs. Jeneen Rinks on a monthly basis along with Dr. Marla Roe of plastic surgery. She has very refractory venous  insufficiency wounds on the bilateral medial lower legs left greater than right. I been following her for a number of years. At one point I was able to get the right medial malleolus wound to heal and had the left medial malleolus down to about half its current size however and I had to send her to plastic surgery for an operative debridement. Since then things have been stable to slightly improve the area on the right is slightly better one in the left about the same although there is much less adherent surface than I'm used to with this patient. She is using some form of liquid collagen gel that Dr. Marla Roe provided a Kerlix cover with the patient's own pressure stockings. She tells me that she has extreme pain in both ankles and along the lateral aspect of both feet. She has been unable to work for some period of time. She is telling me she is retiring at the beginning of April. She sees Dr. Doran Durand of orthopedics next week 09/22/16; patient has not seen Dr. Marla Roe since the last time she is here. I'm not really sure what she is using to the wounds other than bits and pieces of think she had left over including most recently Hydrofera Blue. She is using juxtalite stockings. She is having difficulty with her husband's recent illness "stroke". She is having to transport him to various doctors appointments. Dr. Marla Roe left her the option of a repeat debridement with ACEL however she has not been able to get the time to follow-up on this. She continues to have a fair amount of drainage out of these wounds with certainly precludes leaving dressings on all week 10/13/16; patient has not seen Dr. Marla Roe since she was last in our clinic. I'm not really sure what she is doing with the wounds, we did try to get her [facility] and I think she is actually using this most of the time. Because of drainage she states she has to change this every second day although this is an improvement from what  she used to do. She went to  see Dr. Doran Durand who did not think she had a muscular issue with regards to her feet, he referred her to a neurologist and I think the appointment is sometime in June. I changed her back to Iodoflex which she has used in the past but not recently. 11/03/16; the patient has been using Iodoflex although she ran out of this. Still claims that there is a lot of drainage although the wound does not look like this. No surrounding erythema. She has not been back to see Dr. Marla Roe 11/24/16; the patient has been using Iodoflex again but she ran out of it 2 or 3 days ago. There is no major change in the condition of either one of these wounds in fact they are larger and covered in a thick adherent surface slough/nonviable tissue especially on the left. She does not tolerate mechanical debridement in our clinic. Going back to see Dr. Marla Roe of plastic surgery for an operative debridement would seem reasonable. 12/15/16; the patient has not been back to see Dr. Marla Roe. She is been dealing with a series of illnesses and her husband which of monopolized her time. She is been using Sorbact which we largely supplied. She states the drainage is bad enough that it maximum she can go 2-3 days without changing the dressing 01/12/2017 -- the patient has not been back for about 4 weeks and has not seen Dr. Marla Roe not does she have any appointment pending. 01/23/17; patient has not seen Dr. Marla Roe even though I suggested this previously. She is using Santyl that was suggested last week by Dr. Con Memos this Cost her $16 through her insurance which is indeed surprising 02/12/17; continuing Santyl and the patient is changing this daily. A lot of drainage. She has not been back to see plastic surgery she is using an Ace wrap. Our intake nurse suggested wrap around stockings which would make a good reasonable alternative 02/26/17; patient is been using Santyl and changing this daily due to  drainage. She has not been to see plastic surgery she uses in April Ace wrap to control the edema. She did obtain extremitease stockings but stated that the edema in her leg was to big for these 03/20/17; patient is using Santyl and Anasept. Surfaces looked better today the area on the right is actually measuring a little smaller. She has states she has a lot of pain in her feet and ankles and is asking for a consult to pain control which I'll try to help her with through our case manager. 04/10/17; the patient arrives with better-looking wound surfaces and is slightly smaller wound on the left she is using a combination of Santyl and Anasept. She has an appointment or at least as started in the pain control center associated with [facility] 05/14/17; this is a patient who I followed for a prolonged period of time. She has venous insufficiency ulcers on her bilateral medial ankles. At one point I had this down to a much smaller wound on the left however these reopened and we've never been able to get these to heal. She has been using Santyl and Anasept gel although 2 weeks ago she ran out of the Anasept gel. She has a stable appearance of the wound. She is going to the wound care clinic at [facility]. They wanted do a nerve block/spinal block although she tells me she is reluctant to go forward with that. 05/21/17; this is a patient I have followed for many years. She has venous insufficiency ulcers  on her bilateral medial ankles. Chronic pain and deformity in her ankles as well. She is been to see plastic surgery as well as orthopedics. Using PolyMem AG most recently/Kerramax/ABDs and 2 layer compression. She has managed to keep this on and she is coming in for a nurse check to change the dressing on Tuesdays, we see her on Fridays 06/05/17; really quite a good looking surface and the area especially on the right medial has contracted in terms of dimensions. Well granulated  healthy-looking tissue on both sides. Even with an open curet there is nothing that even feels abnormal here. This is as good as I've seen this in quite some time. We have been using PolyMem AG and bringing her in for a nurse check 06/12/17; really quite good surface on both of these wounds. The right medial has contracted a bit left is not. We've been using PolyMem and AG and she is coming in for a nurse visit 06/19/17; we have been using PolyMem AG and bringing her in for a nurse check. Dimensions of her wounds are not better but the surfaces looked better bilaterally. She complained of bleeding last night and the left wound and increasing pain bilaterally. She states her wound pain is more neuropathic than just the wounds. There was some suggestion that this was radicular from her pain management doctor in talking to her it is really difficult to sort this out. 06/26/17; using PolyMem and AG and bringing her in for a nurse check as All of this and reasonably stable condition. Certainly not improved. The dimensions on the lateral part of the right leg look better but not really measuring better. The medial aspect on the left is about the same. 07/03/16; we have been using PolyMen AG and bringing her in for a nurse check to change the dressings as the wounds have drainage which precludes once weekly changing. We are using all secondary absorptive dressings.our intake nurse is brought up the idea of using a wound VAC/snap VAC on the wound to help with the drainage to see if this would result in some contraction. This is not a bad idea. The area on the right medial is actually looking smaller. Both wounds have a reasonable-looking surface. There is no evidence of cellulitis. The edema is well controlled 07/10/17; the patient was denied for a snap VAC by her insurance. The major issue with these wounds continues to be drainage. We are using wicked PolyMem AG and she is coming in for a nurse visit to change  this. The wounds are stable to slightly improved. The surface looks vibrant and the area on the right certainly has shrunk in size but very slowly 07/17/17; the patient still has large wounds on her bilateral medial malleoli. Surface of both of these wounds looks better. The dimensions seem to come and go but no consistent improvement. There is no epithelialization. We do not have options for advanced treatment products due to insurance issues. They did not approve of the wound VAC to help control the drainage. More recently we've been using PolyMem and AG wicked to allow drainage through. We have been bringing her in for a nurse visit to change this. We do not have a lot of options for wound care products and the home again due to insurance issues 07/24/17; the patient's wound actually looks somewhat better today. No drainage measurements are smaller still healthy-looking surface. We used silver collagen under PolyMen started last week. We have been bringing her in for a  dressing change 07/31/17; patient's wound surface continued to look better and I think there is visible change in the dimensions of the wound on the right. Rims of epithelialization. We have been using silver collagen under PolyMen and bringing her in for a dressing change. There appears to be less drainage although she is still in need of the dressing change 08/07/17. Patient's wound surface continues to look better on both sides and the area on the right is definitely smaller. We have been using silver collagen and PolyMen. She feels that the drainage has been it has been better. I asked her about her vascular status. She went to see Dr. Aleda Grana at [facility] and had some form of ablation. I don't have much detail on this. I haven't my notes from 2016 that she was not a candidate for any further ablation but I don't have any more information on this. We had referred her to vein and vascular I don't think she ever went. He does not  have a history of PAD although I don't have any information on this either. We don't even have ABIs in our record 08/14/17; we've been using silver collagen and PolyMen cover. And putting the patient and compression. She we are bringing her in as a nurse visit to change this because ofarge amount of drainage. We didn't the ABIs in clinic today since they had been done in many moons 1.2 bilaterally. She has been to see vein and vascular however this was at [facility] and she had ablation although I really don't have any information on this all seemed biking get a report. She is also been operatively debrided by plastic surgery and had a cell placed probably 8-12 months ago. This didn't have a major effect. We've been making some gains with current dressings 08/19/17-She is here in follow-up evaluation for bilateral medial malleoli ulcers. She continues to tolerate debridement very poorly. We will continue with recently changed topical treatment; if no significant improvement may consider switching to Iodosorb/Iodoflex. 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 She has been using silver collagen and PolyMem. I believe she has been used and tried on Iodoflex before. 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 These have been very refractory over time. We have been using silver collagen and PolyMen. She is coming in once a week for a doctor's and once a week for nurse visits. We are actually making some progress 09/18/17; the patient's wounds are smaller especially on the right medial. She arrives today to upset to consider even washing these off with Anasept which I think is been part of the reason this is been closing. We've been using collagen covered in PolyMen  otherwise. It is noted that she has a small area of folliculitis on the right medial calf that. As we are wrapping her legs I'll give her a short course of doxycycline to make sure this doesn't amount to anything. She is a long list of complaints today including imbalance, shortness of breath on exertion, inversion of her left ankle. With regards to the latter complaints she is been to see orthopedics and they offered her a tendon release surgery I believe but wanted her wounds to be closed first. I have recommended she go see her primary physician with regards to  everything else. 09/25/17; patient's wounds are about the same size. We have made some progress bilaterally although not in recent weeks. She will not allow me T wash these wounds with Anasept even if she is doing her cell. Wheeze we've been using collagen covered in PolyMen. Last week she had a small area of folliculitis this is now opened into a small wound. She completed 5 days of trimethoprim sulfamethoxazole 10/02/17; unfortunately the area on her left medial ankle is worse with a larger wound area towards the Achilles. The patient complains of a lot of pain. She will not allow debridement although visually I don't think there is anything to debridement in any case. We have been using silver collagen and PolyMen for several months now. Initially we are making some progress although I'm not really seeing that today. We will move back to [facility]. His admittedly this is a bit of a repeat however I'm hoping that his situation is different now. The patient tells me she had her leg on the left give out on her yesterday this is process some pain. 10/09/17; the patient is seen twice a week largely because of drainage issues coming out of the chronic medial bimalleolar wounds that are chronic. Last week the dimensions of the one on the left looks a little larger I changed her to [facility]. She comes in today with a history of terrible  pain in the bilateral wound areas. She will not allow debridement. She will not even allow a tissue culture. There is no surrounding erythema no no evidence of cellulitis. We have been putting her Kerlix Coban man. She will not allow more aggressive compression as there was a suggestion to put her in 3 layer wraps. 10/16/17; large wounds on her bilateral medial malleoli. These are chronic. Not much change from last week. The surface looks have healthy but absolutely no epithelialization. A lot of pain little less so of drainage. She will not allow debridement or even washing these off in the vigorous fashion with Anasept. 10/23/17; large wounds on her bilateral malleoli which are chronic. Some improvement in terms of size perhaps on the right since last time I saw these. She states that after we increased the 3 layer compression there was some bleeding, when she came in for a nurse visit she did not want 3 layer compression put back on about our nurse managed to convince her. She has known chronic venous visit issues and I'm hoping to get her to tolerate the 3 layer compression. using Hydrofera Blue 10/30/17; absolutely no change in the condition of either wound although we've had some improvement in dimensions on the right.. Attempted to put her in 3 layer compression she didn't tolerated she is back in 2 layer compression. We've been using Hydrofera Blue We looked over her past records. She had venous reflux studies in November 2016. There was no evidence of deep venous reflux on the right. Superficial vein did not show the greater saphenous vein at think this is been previously ablated the small saphenous vein was within normal limits. The left deep venous system showed no DVT the vessels were positive for deep venous reflux in the posterior tibial veins at the ankle. The greater saphenous vein was surgically absent small saphenous vein was within normal limits. She went to vein and vascular at  [facility]. I believe she had an ablation on the left greater saphenous vein. I'll update her reflux studies perhaps ever reviewed by vein and vascular. We've  made absolutely no progress in these wounds. Will also try to read and TheraSkins through her insurance 11/06/17; W the patient apparently has a 2 week follow-up with vein and vascular I like him to review the whole issue with regards to her previous vascular workup by Dr. Aleda Grana. We've really made no progress on these wounds in many months. She arrives today with less viable looking surface on the left medial ankle wound. This was apparently looking about the same on Tuesday when she was here for nurse visit. 11/13/17; deep tissue culture I did last time of the left lower leg showed multiple organisms without any predominating. In particular no Staphylococcus or group A strep were isolated. We sent her for venous reflux studies. She's had a previous left greater saphenous vein stripping and I think sclerotherapy of the right greater saphenous vein. She didn't really look at the lesser saphenous vein this both wounds are on the medial aspect. She has reflux in the common femoral vein and popliteal vein and an accessory vein on the right and the common femoral vein and popliteal vein on the left. I'm going to have her go to see vein and vascular just the look over things and see if anything else beside aggressive compression is indicated here. We have not been able to make any progress on these wounds in spite of the fact that the surface of the wounds is never look too bad. 11/20/17; no major change in the condition of the wounds. Patient reports a large amount of drainage. She has a lot of complaints of pain although enlisting her today I wonder if some of this at least his neuropathic rather than secondary to her wounds. She has an appointment with vein and vascular on 12/30/17. The refractory nature of these wounds in my mind at least  need vein and vascular to look over the wounds the recent reflux studies we did and her history to see if anything further can be done here. I also note her gait is deteriorated quite a bit. 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 She has a bilateral Trendelenburg gait. I wonder if this is neuropathic or perhaps multilevel radicular. 11/27/17; her wounds actually looks slightly better. Healthy-looking granulation tissue a scant amount of epithelialization. [place] healthcare will not pay for [employer]. They will play for tri layer Oasis and Dermagraft. This is not a diabetic ulcer. We'll try for the tri layer Oasis. She still complains of some drainage. She has a vein and vascular appointment on 12/30/17 12/04/17; the wounds visually look quite good. Healthy-looking granulation with some degree of epithelialization. We are still waiting for response to our request for trial to try layer Oasis. Her appointment with vascular to review venous and arterial issues isn't sold the end of July 7/31. Not allow debridement or even vigorous cleansing of the wound surface. 12/18/17; slightly smaller especially on the right. 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 We've been using Hydrofera Blue. We still are looking into triple layer Oasis through her insurance 01/08/18 on evaluation today patient's wound actually appears to be showing signs of good improvement at this point in time. She has been tolerating the dressing changes without complication. Fortunately there does not appear to be any evidence of infection at this point in time. We have been utilizing silver nitrate which does seem to be of benefit for her which is also good news. Overall I'm very happy with how things seem to be both  regards appearance as well as measurement. Patient did see Dr. Bridgett Larsson for evaluation on 12/30/17. In his assessment he felt that stripping would not likely add much more than chronic  compression to the patient's healing process. His recommendation was to follow-up in three months with Dr. Doren Custard if she hasn't healed in order to consider referral back to you and see vascular where she previously was in a trial and was able to get her wound to heal. I'll be see what she feels she when you staying compression and he reiterated this as well. 01/13/18 on evaluation today patient appears to actually be doing very well in regard to her bilateral medial malleolus ulcers. She seems to have tolerated the chemical cauterization with silver nitrate last week she did have some pain through that evening but fortunately states that I'll be see since it seems to be doing better she is overall pleased with the progress. 01/21/18; really quite a remarkable improvement since I've last seen these wounds. We started using silver nitrate specially on the islands of hyper granulation which for some reason her around the wound circumference. This is really done quite nicely. Primary dressing Hydrofera Blue under 4 layer compression. She seems to be able to hold out without a nurse rewrap. Follow-up in 1 week 01/28/18; we've continued the hydrofera blue but continued with chemical cauterization to the wound area that we started about a month ago for irregular hyper granulation. She is made almost stunning improvement in the overall wound dimensions. I was not really expecting this degree of improvement in these chronic wounds 02/05/18; we continue with Hydrofera Bluebut of also continued the aggressive chemical cauterization with silver nitrate. We made nice progress with the right greater than left wound. 02/12/18. We continued with Hydrofera Blue after aggressive chemical cauterization with silver nitrate. We appear to be making nice progress with both wound areas 02/19/2018; we continue with [facility] after washing the wounds vigorously with Anasept spray and chemical cauterization with silver nitrate.  We are making excellent progress. The area on the right's just about closed 02/26/2018. The area on the left medial ankle had too much necrotic debris today. I used a #5 curette we are able to get most of the soft. I continued with the silver nitrate to the much smaller wound on the right medial ankle she had a new area on her right lower pretibial area which she says was due to a role in her compression 03/05/2018; both wound areas look healthy. Not much change in dimensions from last week. I continue to use silver nitrate and Hydrofera Blue. The patient saw Dr. Doren Custard of vein and vascular. He felt she had venous stasis ulcers. He felt based on her previous arteriogram she should have adequate circulation for healing. Also she has deep venous reflux but really no significant correctable superficial venous reflux at this time. He felt we should continue with conservative management including leg elevation and compression 04/02/2018; since we last saw this woman about a month ago she had a fall apparently suffered a pelvic fracture. I did not look up the x-ray. Nevertheless because of pain she literally was bedbound for 2 weeks and had home health coming out to change the dressing. Somewhat predictably this is resulted in considerable improvement in both wound areas. The right is just about closed on the medial malleolus and the left is about half the size. 04/16/2018; both her wounds continue to go down in size. Using Hydrofera Blue. 05/07/18; both her wounds  appeared to be improving especially on the right where it is almost closed. We are using Hydrofera Blue 05/14/2018; slightly worse this week with larger wounds. Surface on the left medial not quite as good. We have been using Hydrofera Blue 05/21/18; again the wounds are slightly larger. Left medial malleolus slightly larger with eschar around the circumference. We have been using Hydrofera Blue undergoing a wraps for a prolonged period of time. This  got a lot better when she was more recumbent due to a fall and a back injury. I change the primary dressing the silver alginate today. She did not tolerate a 4 layer compression previously although I may need to bring this up with her next time 05/28/2018; area on the left medial malleolus again is slightly larger with more drainage. Area on the right is roughly unchanged. She has a small area of folliculitis on the right medial just on the lower calf. This does not look ominous. 06/03/2018 left medial malleolus slightly smaller in a better looking surface. We used silver nitrate on this last time with silver alginate. The area on the right appears slightly smaller 1/10; left medial malleolus slightly smaller. Small open area on the right. We used silver nitrate and silver alginate as of 2 weeks ago. We continue with the wound and compression. These got a lot better when she was off her feet 1/17; right medial malleolus wound is smaller. The left may be slightly smaller. Both surfaces look somewhat better. 1/24; both wounds are slightly smaller. Using silver alginate under Unna boots 1/31; both wounds appear smaller in fact the area on the right medial is just about closed. Surface eschar. We have been using silver alginate under Unna boots. The patient is less active now spends let much less time on her feet and I think this is contributed to the general improvement in the wound condition 2/7; both wounds appear smaller. I was hopeful the right medial would be closed however there there is still the same small open area. Slight amount of surface eschar on the left the dimensions are smaller there is eschar but the wound edges appear to be free. We have been using silver alginate under Unna boot's 2/14; both wounds once again measure smaller. Circumferential eschar on the left medial. We have been using silver alginate under Unna boots with gradual improvement 2/21; the area on the right medial  malleolus has healed. The area on the left is smaller. We have been using silver alginate and Unna boots. We can discharge wrapping the right leg she has 20/30 stockings at home she will need to protect the scar tissue in this area 2/28; the area on the right medial malleolus remains closed the patient has a compression stocking. The area on the left is smaller. We have been using silver alginate and Unna boots. 3/6 the area on the right medial ankle remains closed. Good edema control noted she is using her own compression stocking. The area on the left medial ankle is smaller. We have been managing this with silver alginate and Unna boots which we will continue today. 3/13; the area on the right medial ankle remains closed and I'm declaring it healed today. When necessary the left is about the same still a healthy-looking surface but no major change and wound area. No evidence of infection and using silver alginate under unna and generally making considerable improvement 3/27 the area on the right medial ankle remains closed the area on the left is about the  same as last week. Certainly not any worse we have been using silver alginate under an Unna boot 4/3; the area on the right medial ankle remains closed per the patient. We did not look at this wound. The wound on the left medial ankle is about the same surface looks healthy we have been using silver alginate under an Unna boot 4/10; area on the right medial ankle remains closed per the patient. We did not look at this wound. The wound on the left medial ankle is slightly larger. The patient complains that the [facility] caused burning pain all week. She also told us that she was a lot more active this week. Changed her back to silver alginate 4/17; right medial ankle still closed per the patient. Left medial ankle is slightly larger. Using silver alginate. She did not tolerate Hydrofera Blue on this area 4/24; right medial ankle remains  closed we have not look at this. The left medial ankle continues to get larger today by about a centimeter. We have been using silver alginate under Unna boots. She complains about 4 layer compression as an alternative. She has been up on her feet working on her garden 5/8; right medial ankle remains closed we did not look at this. The left medial ankle has increased in size about 100%. We have been using silver alginate under Unna boots. She noted increased pain this week and was not surprised that the wound is deteriorated 5/15; no major change in SA however much less erythema ( one week of doxy ocellulitis). 5/22-66 year old female returns at 1 week to the clinic for left medial ankle wound for which we have been using silver alginate under 3 layer compression She was placed on DOXY at last visit - the wound is wider at this visit. She is in 3 layer compression 5/29; change to [facility] last week. I had given her empiric doxycycline 2 weeks ago for a week. She is in 3 layer compression. She complains of a lot of pain and drainage on presentation today. 6/5; using Hydrofera Blue. I gave her doxycycline recently empirically for erythema and pain around the wound. Believe her cultures showed enterococcus which not would not have been well covered by doxycycline nevertheless the wound looks better and I don't feel specifically that the enterococcus needs to be covered. She has a new what looks like a wrap injury on her lateral left ankle. 6/12; she is using Hydrofera Blue. She has a new area on the left anterior lower tibial area. This was a wrap injury last week. 6/19; the patient is using Hydrofera Blue. She arrived with marked inflammation and erythema around the wound and tenderness. 12/01/18 on evaluation today patient appears to be doing a little bit better based on what I'm hearing from the standpoint of lassos evaluation to this as far as the overall appearance of the wound is concerned.  Then sometime substandard she typically sees Dr. Dellia Nims. Nonetheless overall very pleased with the progress that she's made up to this point. No fevers, chills, nausea, or vomiting noted at this time. 7/10; some improvement in the surface area. Aggressively debrided last week apparently. I went ahead with the debridement today although the patient does not tolerate this very well. We have been using Iodoflex. Still a fair amount of drainage 7/17; slightly smaller. Using Iodoflex. 7/24; no change from last week in terms of surface area. We have been using Iodoflex. Surface looks and continues to look somewhat better 7/31; surface area  slightly smaller better looking surface. We have been using Iodoflex. This is under Unna boot compression 8/7-Patient presents at 1 week with Unna boot and Iodoflex, wound appears better 8/14-Patient presents at 1 week with Iodoflex, we use the Unna boot, wound appears to be stable better.Patient is getting Botox treatment for the inversion of the foot for tendon release, Next week 8/21; we are using Iodoflex. Unna boot. The wound is stable in terms of surface area. Under illumination there is some areas of the wound that appear to be either epithelialized or perhaps this is adherent slough at this point I was not really clear. It did not wipe off and I was reluctant to debride this today. 8/28; we are using Iodoflex in an Unna boot. Seems to be making good improvement. 9/4; using Iodoflex and wound is slightly smaller. 9/18; we are using Iodoflex with topical silver nitrate when she is here. The wound continues to be smaller 10/2; patient missed her appointment last week due to GI issues. She left and Iodoflex based dressing on for 2 weeks. Wound is about the same size about the size of a dime on the left medial lower 10/9 we have been using Iodoflex on the medial left ankle wound. She has a new superficial probable wrap injury on the dorsal left ankle 10/16; we have  been using Hydrofera Blue since last week. This is on the left medial ankle 10/23; we have been using Hydrofera Blue since 2 weeks ago. This is on the left medial ankle. Dimensions are better 11/6; using Hydrofera Blue. I think the wound is smaller but still not closed. Left medial ankle 11/13; we have been using Hydrofera Blue. Wound is certainly no smaller this week. Also the surface not as good. This is the remanent of a very large area on her left medial ankle. 11/20; using Sorbact since last week. Wound was about the same in terms of size although I was disappointed about the surface debris 12/11; 3-week follow-up. Patient was on vacation. Wound is measuring slightly larger we have been using Sorbact. 12/18; wound is about the same size however surface looks better last week after debridement. We have been using Sorbact under compression 1/15 wound is probably twice the size of last time increased in length nonviable surface. We have been using Sorbact. She was running a mild fever and missed her appointment last week 1/22; the wound is come down in size but under illumination still a very adherent debris we have been Hydrofera Blue that I changed her to last week 1/29; dimensions down slightly. We have been using Hydrofera Blue 2/19 dimensions are the same however there is rims of epithelialization under illumination. Therefore more the surface area may be epithelialized 2/26; the patient's wound actually measures smaller. The wound looks healthy. We have been using Hydrofera Blue. I had some thoughts about running Apligraf then I still may do that however this looks so much better this week we will delay that for now 3/5; the wound is small but about the same as last week. We have been using Hydrofera Blue. No debridement is required today. 3/19; the wound is about the size of a dime. Healthy looking wound even under illumination. We have been using Hydrofera Blue. No mechanical  debridement is necessary 3/26; not much change from last week although still looks very healthy. We have been using Hydrofera Blue under Unna boots Patient was offered an ankle fusion by podiatry but not until the wound heals with a  proceed with this. 4/9; the patient comes in today with her original wound on the medial ankle looking satisfactory however she has some uncontrolled swelling in the middle part of her leg with 2 new open areas superiorly just lateral to the tibia. I think this was probably a wrap issue. She said she felt uncomfortable during the week but did not call in. We have been using Hydrofera Blue 4/16; the wound on the medial ankle is about the same. She has innumerable small areas superior to this across her mid tibia. I think this is probably folliculitis. She is also been working in the yard doing a lot of sweating 4/30; the patient issue on the upper areas across her mid tibia of all healed. I think this was excessive yard work if I remember. Her wound on the medial ankle is smaller. Some debris on this we have been using Hydrofera Blue under Unna boots 5/7; mid tibia. She has been using Hydrofera Blue under an Unna wrap. She is apparently going for her ankle surgery on June 3 10/28/19-Patient returns to clinic with the ankle wound, we are using Hydrofera Blue under Unna wrap, surgery is scheduled for her left foot for June 3 so she will be back for nurse visit next week READMISSION 01/17/2020 Mrs. Such is a 66 year old woman we have had in this clinic for a long period of time with severe venous hypertension and refractory wounds on her medial lower legs and ankles bilaterally. 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 When she was off her legs for a prolonged period example when she fell and suffered a compression fracture things would heal up quite  nicely. She is now retired and we managed to heal up the right medial leg wound. The left one was very tiny last time I saw this although still refractory. 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 It got to the point where this was interfering with her walking and she elected to proceed with a ankle arthrodesis to straighten her her ankle and leave her with a functional outcome for mobilization. The patient was referred to Dr. Doren Custard and really this took some time to arrange. Dr. Doren Custard saw her on 12/07/2019. Once again he verified that she had no arterial issues. She had previously had an angiogram several years ago. Follow-up ABIs on the left showed an ABI of 1.12 with triphasic waveforms and a TBI of 0.92. She is felt to have chronic deep venous insufficiency but I do not think it was felt that anything could be done from about this from an ablation point of view. At the time Dr. Doren Custard saw this patient the wounds actually look closed via the pictures in his clinic. The patient finally underwent her surgery on 12/15/2019. This went reasonably well and there was a good anatomic outcome. She developed a small distal wound dehiscence on the lateral part of the surgical wound. However more problematically she is developed recurrence of the wound on the medial left ankle. There are actually 2 wounds here one in the distal lower leg and 1 pretty much at the level of the medial malleolus. It is a more distal area that is more problematic. She has been using Hydrofera Blue which started on Friday before this she was simply  Ace wrapping. There was a culture done that showed Pseudomonas and she is on ciprofloxacin. A recent CNS on 8/11 was negative. The patient reports some pain but I generally think this is improving. She is using a cam boot completely nonweightbearing using a walker for pivot transfers and a  wheelchair 8/24; not much improvement unfortunately she has a surgical wound on the lateral part in the venous insufficiency wound medially. The bottom part of the medial insufficiency wound is still necrotic there is exposed tendon here. We have been using Hydrofera Blue under compression. Her edema control is however better 8/31; patient in for follow-up of his surgical wound on the lateral part of her left leg and chronic venous insufficiency ulcers medially. We put her back in compression last week. She comes in today with a complaint of 3 or 4 days worth of increasing pain. She felt her cam walker was rubbing on the area on the back of her heel. However there is intense erythema seems more likely she has cellulitis. She had 2 cultures done when she was seeing podiatry in the postop. One of them in late July showed Pseudomonas and she received a course of ciprofloxacin the other was negative on 8/11 she is allergic to penicillin with anaphylactoid complaints of hives oral swelling via information in epic 9/9; when I saw this patient last week she had intense anterior erythema around her wound on the right lateral heel and ankle and also into the right medial heel. Some of this was no doubt drainage and her walker boot however I was convinced she had cellulitis. I gave her Levaquin and Bactrim she is finishing up on this now. She is following up with Dr. [name] he saw her yesterday. He is taken her out of the walking boot of course she is still nonweightbearing. Her x-ray was negative for any worrisome features such as soft tissue air etc. Things are a lot better this week. She has home health. We have been using Hydrofera Blue under an [employer] which she put back on yesterday. I did not wrap her last week 9/17; her surrounding skin looks a lot better. In fact the area on the left lateral ankle has just a scant amount of eschar. The only remaining wound is the large area on the left medial ankle.  Probably about 60% of this is healthy granulation at the surface however she has a significant divot distally. This has adherent debris in it. I been using debridement and silver collagen to try and get this area to fill-in although I do not think we have made much progress this week 9/24; the patient's wound on the left medial ankle looks a lot better. The deeper divot area distally still requires debridement but this is cleaning up quite nicely we have been using silver collagen. The patient is complaining of swelling in her foot and is worried that that is contributing to the nonhealing of the ankle wound. She is also complaining of numbness in her anterior toes 10/4; left medial ankle. The small area distally still has a divot with necrotic material that I have been debriding away. This has an undermining area. She is approved for Apligraf. She saw Dr. [name] her surgeon on 10/1. I think he declared himself is satisfied with the condition of things. Still nonweightbearing till the end of the month. 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 Her surgical wound is well closed. There is no evidence of infection  10/11; the patient arrived in clinic today with the expectation that we be able to put an Apligraf on this area after debridement however she arrives with a relatively large amount of green drainage on the dressing. The patient states that this started on Friday. She has not been systemically unwell. 10/19; culture I did last week showed both Enterococcus and Pseudomonas. I think this came in separate parts because I stopped her ciprofloxacin I gave her and prescribed her linezolid however now looking at the final culture result this is Pseudomonas which is resistant to quinolones. She has not yet picked up the linezolid apparently phone issues. We are also trying to get a topical antibiotic out of [place] in [place] they can be applied by home health. She  is still having green drainage 10/16; the patient has her topical antibiotic from [facility] in [place]. This is a compounded gel with vancomycin and ciprofloxacin and gentamicin. We are applying this on the wound bed with silver alginate over the top with Unna boot wraps. She arrives in clinic today with a lot less ominous looking drainage although she is only use this topical preparation once the second time today. She sees Dr. [name] her surgeon on Friday she has home health changing the dressing 11/2; still using her compounded topical antibiotic under silver alginate. Surface is cleaning up there is less drainage. We had an Apligraf for her today and I elected to apply it. A light coating of her antibiotic 04/25/2020 upon evaluation today patient appears to be doing well with regard to her ankle ulcer. There is a little bit of slough noted on the surface of the wound I am can have to perform sharp debridement to clear this away today. With that being said other than that fact overall I feel like she is making progress and we do see some new epithelial growth. There is also some improvement in the depth of the wound and that distal portion. There is little bit of slough there as well. 12/7; 2-week follow-up. Apligraf #3. Dimensions are smaller. Closing in especially inferiorly. Still some surface debris. Still using the [facility] topical antibiotic but I told her that I don't think this needs to be renewed 12/21; 2-week follow-up. Apligraf #4 dimensions are smaller. Nice improvement 06/05/2020; 2-week follow-up. The patient's wound on the left medial ankle looks really excellent. Nice granulation. Advancing epithelialization no undermining no evidence of infection. We would have to reapply for another Apligraf but with the condition of this wound I did not feel strongly about it. We used Hydrofera Blue under the same degree of compression. She follows up with Dr. [name] her surgeon a week  Friday 06/13/2020 upon evaluation today patient appears to be doing excellent in regard to her wound. She has been tolerating the dressing changes without complication. Fortunately there is no signs of active infection at this time. No fevers, chills, nausea, vomiting, or diarrhea. She was using Hydrofera Blue last week. 06/20/2020 06/20/2020 on evaluation today patient actually appears to be doing excellent in regard to her wound. This is measuring better and looking much better as well. She has been using the collagen that seems to be doing better for her as well even though the [facility] was and is not sticking or feeling as rough on her wound. She did see Dr. [name] on Friday he is very pleased he also stated none of the hardware has shifted. That is great news 1/27; the patient has a small clean wound  all that is remaining. I agree that this is too small to really consider an Apligraf. Under illumination the surface is looking quite good. We have been using collagen although the dimensions are not any better this week 2/2; the patient has a small clean wound on the left medial ankle. Although this left of her substantial original areas. Measurements are smaller. We have been using polymen Ag under an Haematologist. 2/10; small area on the left medial ankle. This looks clean nothing to debride however dimensions are about the same we have been using polymen I think now for 2 weeks 2/17; not much change in surface area. We have been using polymen Ag without any improvement. 3/17; 1 month follow-up. The patient has been using endoform without any improvement in fact I think this looks worse with more depth and more expansion 3/24; no improvement. Perhaps less debris on the surface. We have been using Sorbact for 1 week 4/4; wound measures larger. She has edema in her leg and her foot which she tells as her wrap came down. We have been using Unna boots. Sorbact of the wound. She has been approved for  Apligraf 09/12/2020 upon evaluation today patient appears to be doing well with regard to her wound. We did get the Apligraf reapproved which is great news we have that available for application today. Fortunately there is no signs of infection and overall the patient seems to be doing great. The wound bed is nice and clean. 4/27; patient presents for her second application of Apligraf. She states over the past week she has been on her feet more often due to being outside in her garden. She has noted more swelling to her foot as a result. She denies increased warmth, pain or erythema to the wound site. 10/10/2020 upon evaluation today patient appears to be doing well with regard to her wound which does not appear to be quite as irritated as last week from what I am hearing. With that being said unfortunately she is having issues with some erythema and warmth to touch as well as an increase size. I do believe this likely is infected. 10/17/2020 upon evaluation today patient appears to be doing excellent in regard to her wound this is significantly improved as compared to last week. Fortunately I think that the infection is much better controlled at this point. She did have evidence of both Enterococcus as well as Staphylococcus noted on culture. Enterococcus really would not be helped significantly by the Cipro but the wound is doing so much better I am under the assumption that the Staphylococcus is probably the main organism that is causing the current infection. Nonetheless I think that she is doing excellent as far as that is concerned and I am very pleased in that regard. I would therefore recommend she continue with the Cipro. 10/31/2020 upon evaluation today patient appears to be doing well with regard to her wound. She has been tolerating the dressing changes without complication. Fortunately there is no signs of active infection and overall I am extremely pleased with where things stand today. No  fevers, chills, nausea, vomiting, or diarrhea. With that being said she does have some green drainage coming from the wound and although it looks okay I am a little concerned about the possibility of a continuing infection. Specifically with Pseudomonas. For that reason I will go ahead and send in a prescription for Cipro for her to be continued. 11/14/2020 upon evaluation today patient appears to be doing  very well currently in regard to her wound on her leg. She has been tolerating the dressing changes without complication. Fortunately I feel like the infection is finally under good control here. Unfortunately we do not have the Apligraf for application today although we can definitely order to have it in place for next week. That will be her fifth and final of the current series. Nonetheless I feel like her wound is really doing quite well which is great news. 11/21/2020 upon evaluation today patient appears to be doing well with regard to her wound on the medial ankle. Fortunately I think the infection is under control and I do believe we can go ahead and reapply the Apligraf today. She is in agreement with that plan. There does not appear to be any signs of active infection at this time which is great news. No fevers, chills, nausea, vomiting, or diarrhea. 12/05/2020 upon evaluation today patient's wound bed actually showed signs of good granulation epithelization at this point. There does not appear to be any signs of infection which is great news and overall very pleased with where things stand. Overall the patient seems to be doing fairly well in my opinion with regard to her wound although I do believe she continues to build up a lot of biofilm I think she could benefit from using PuraPly at this point. 12/12/2020 upon evaluation today patient's wound actually appears to be doing decently well today. The Unna boot has not been quite as well-tolerated so that more uncomfortable for her and even  causing some pressure over the plate on the lateral portion of her foot which is 90 where the wound is. There did not appear to be any significant deep tissue injury with that there may be a minimal change in the skin noted I think that we may want to go back to the Coflex 2 layer which is a little bit easier on her skin it seems. 12/19/2020 upon evaluation today patient actually seems to be making great progress with the PuraPly currently. She in fact seems to be much better as far as the overall appearance of the wound bed is concerned I am very happy in this regard. I do not see any signs of of infection which is great news as well. No fevers, chills, nausea, vomiting, or diarrhea. 12/26/2020 upon evaluation today patient appears to actually be doing better in regard to her wound on the left medial ankle region. The surface of the wound is actually doing significantly better which is great news. There does not appear to be any signs of infection which is also great news and in general I am extremely pleased with where we stand today. 01/02/2021 upon evaluation today patient appears to be doing well with regard to her wound. In fact this is showing signs of excellent improvement and very pleased with where things stand. In fact the last 3 appointments have all shown signs of this getting smaller which is excellent news. I have not even had to perform any debridement and today is no exception. Overall I feel like this is dramatically improved compared to previous. T oday is PuraPly application #4. 4/40/3474 upon evaluation today patient appears to be doing excellent in regard to her wound this is continue to show signs of improvement and overall I am extremely pleased with where we stand today. She is actually here for PuraPly application #5. Every time we have applied this she is noted definite improvement on measurements. 01/23/2021 upon evaluation today  patient is actually making good progress in  regard to her wound. This was actually on just a little bit longer this time compared to previous due to the fact that she did have to go out of town. She is actually here for PuraPly application #6. We have definitely been seeing improvements in the overall quality of the tissue on the surface of the wound which is awesome news. In general I think that the patient seems to be continuing to make great progress here. 01/30/2021 upon evaluation today patient's wound is actually doing excellent. There is really not any significant biofilm buildup which is great news and overall I am extremely pleased with where things stand today. There does not appear to be any signs of active infection. No fevers, chills, nausea, vomiting, or diarrhea. 02/06/2021 upon evaluation today patient's wound is actually showing signs of excellent improvement which is great news. We continue to see the benefit of the PuraPly this is doing a great job the wound seems not really irritated whatsoever and is showing signs of good granulation at this point. Overall I am extremely happy with what we are seeing. The patient likewise is happy to hear all of this as well. 02/13/2021 upon evaluation today patient appears to be doing well with regard to her wound. This again is measuring smaller today and I am very pleased in this regard. Fortunately there does not appear to be any signs of active infection at this time which is good news from a systemic standpoint. Locally there is still some significant drainage which she does have is concerned about infection locally. No fevers, chills, nausea, vomiting, or diarrhea. 02/20/2021 upon evaluation today patient actually appears to be doing decently well in regard to her wound. She has been tolerating the dressing changes without complication. I do believe the PuraPly is helping wound bed does appear to be doing better. There is no evidence of active infection locally or systemically at this  point visually although on fluorescence imaging there still appear to be bacterial load and bioburden noted. 02/27/2021 upon evaluation today patient fortunately continues to show signs of improvement with use of the PuraPly currently. Subsequently we did review her culture results and to be honest I think that the Bactrim is probably the best option to have her continue at this point. For that reason I am get a go ahead and send in a refill today for her for an additional 2 weeks. Nonetheless I think that we are making excellent progress here. It was Enterococcus and Staphylococcus that were noted she is allergic to penicillin so there is not much I can do from the Enterococcus standpoint though the staff does seem to be sensitive to the Bactrim. 03/06/2021 upon evaluation today patient appears to be doing well with regard to her wound. This is definitely showing signs of improvement which is great news and overall I am extremely pleased with where things stand today. There does not appear to be any signs of active infection which is great news and overall and I do believe that we are headed in the appropriate direction I think the PuraPly is doing an awesome job for her. 03/20/2021 upon evaluation today patient actually appears to be doing well with regard to her wound. We have been using the PuraPly although last week when I was out of the office they actually just used endoform nonetheless this still seems to be doing great. Fortunately there does not appear to be any signs of active  infection which is great news overall I think the patient is making wonderful progress. 03/27/2021 upon evaluation today patient appears to be doing well with regard to her wound in fact there is a little drainage but otherwise her does not appear to be any major signs of open wounds nor infection at this time. Overall I am extremely pleased with where things stand. No fevers, chills, nausea, vomiting,  or diarrhea. 04/03/2021 upon evaluation today patient presents for reevaluation here in the clinic and overall she seems to be doing quite well. Fortunately there does not appear to be any signs of active infection which is great news and in general I am very pleased with how things appear. There is still a very small opening remaining but in general she is looking much better. Fortunately I do not think there is any need for the PuraPly at this point. 04/10/2021 upon evaluation today patient's wound is actually showing signs of excellent improvement. I am actually very pleased with where it stands today. I think she is very close to complete closure in fact this may be healed today. Either way I think that she may benefit from using her own compression socks for the next week and then will subsequently see how things stand following. 04/17/2021 upon evaluation today patient appears to be doing well with regard to her wound on the ankle region. This is actually showing signs of excellent improvement which is great news. I do not see any signs of active infection at this time which is great and no open wound. With that being said on the lateral portion of her ankle right at the base of her plate she actually has an area of pressure that occurred over the past week when she was not in the wrap or we been padding her. This does seem to be an issue which I think is good to be an ongoing problem I am can actually get in touch with Dr. [name] today to see what we can do in that regard. 04/24/2021 upon evaluation today patient appears to be doing well with regard to her ankle region for the most part although there does appear to be some fluid trapped underneath the area where we thought this was healed last week. Unfortunately I think at this time that we can have to reopen and clean away some of this area and then subsequently depending on how things do over the next week or so we will see about discharge but  right now I do not think that is the right way to go. She voiced understanding. 05/01/2021 upon evaluation today patient's wound still appears to be open unfortunately. I do believe that we do need to see what we can do about trying to get this closed I believe compression wrapping is probably can to be indicated here. Fortunately there is no signs of active infection at this time. 05/08/2021 upon evaluation today patient appears to be doing okay in regard to her ulcer this is very superficial and we will see any major issues here. The biggest problem currently that I find is that we just cannot get this area to completely seal up. She seemed to almost be and then things digressed a little bit. Right now is not too bad but we do need to see if we can make some difference here. I think maybe switching over to collagen may be better. 05/15/2021 upon evaluation today patient actually appears to be doing pretty well in regard to her wound.  This is measuring a little smaller and looking much better. She did see orthopedics today there to me making her a insert for her shoe to try to help straighten out her gait. I think this can be beneficial for her as well. 05/22/2021 upon evaluation today patient appears to be doing well currently in regard to her wound although is not significantly smaller unfortunately this at least does not show any signs of active infection at this time which is great news. No fevers, chills, nausea, vomiting, or diarrhea. I am going to see about switching over to endoform that is what we wanted to use in the past always been on backorder. 06/05/2021 upon evaluation today patient appears to be doing somewhat poorly in regard to her wound. This is really not showing the signs of improvement that I would have like to have seen. Fortunately I do not see any signs of active infection locally nor systemically which is great news. With that being said even though I do not see the sign she is  having some increased discomfort which is not good. She is also having some issues here with the wound looking a little larger today compared to where its been. With that being said overall I believe that we probably need to consider at least that there may be a subacute infection here and that is what is going on. We have tried multiple different medications and dressings which have done well for her in the past without seeing any signs of improvement. She is in agreement with addressing this as such. 06/12/2020 upon evaluation today patient's wound is actually showing some signs of improvement today. In fact I do not even see any need for sharp debridement based on what I see at this point today. Fortunately there is no evidence of active infection currently which is great news. No fevers, chills, nausea, vomiting, or diarrhea. 06/26/2021 upon evaluation today patient appears to be doing well with regard to her wounds. Fortunately there does not appear to be any signs of active infection at this time which is great news. I do believe the Bactrim is doing well for her. Fortunately there does not appear to be any signs of active infection at this time. With the hardware in place and everything else I do believe extending this for 2 additional weeks for 4 weeks total would be advisable. 07/03/2021 upon evaluation today patient appears to be doing well with regard to her wound. She has been tolerating the dressing changes which is great news. Fortunately there does not appear to be any evidence of active infection locally nor systemically at this time. 07/10/2021 upon evaluation today patient appears to be doing well with regard to her wound I do feel that the PuraPly has done an awesome job for her over the past week. Fortunately I do not see any signs of active infection at this time. No fevers, chills, nausea, vomiting, or diarrhea. 2/15; the patient's wound is an inverted cone. Most of the walls of the  colon are epithelialized however the tip of the conus is still open. I gather this was considered to be quite an improvement.We reapplied Puraply in the standard fashion 07/24/2021 upon evaluation patient appears to be doing well with regard to her wound. She has been tolerating the dressing changes without complication which is great news. There is no signs of active infection at this time. 07/31/2021 upon evaluation today patient's wound is actually showing signs of improvement which is good news. Fortunately  I do not see any evidence of active infection locally nor systemically at this time which is great news. With that being said the biggest issue that I see is simply that she seems to have drainage that dries over the surface of the wound not allowing it to heal appropriately. That something that I think we need to work on to try to get this to fully closed. 08/07/2021 upon evaluation today patient appears to be doing awesome in regard to her wound. The Xeroform is actually doing a great job here. I do believe that we are on the right track and to be honest though I am going to perform some sharp debridement today this is much preferable to what we have seen in the past. There is no evidence of infection and the skin around appears to be doing awesome. Electronic Signature(s) Signed: 08/07/2021 10:21:42 AM By: [name] PA-C Entered By: [name] on 08/07/2021 10:21:41 -------------------------------------------------------------------------------- Physical Exam Details Patient Name: Date of Service: [facility] [name], [name] 08/07/2021 9:30 A M Medical Record Number: [id number] Patient Account Number: [id number] Date of Birth/Sex: Treating RN: 1956/02/26 (66 y.o. F) Primary Care Provider: [name] Other Clinician: Referring Provider: Treating Provider/Extender: [name] in Treatment: 59 Constitutional Well-nourished and well-hydrated in no acute  distress. Respiratory normal breathing without difficulty. Psychiatric this patient is able to make decisions and demonstrates good insight into disease process. Alert and Oriented x 3. pleasant and cooperative. Notes Upon inspection patient's wound bed showed signs of good granulation and epithelization at this point. Fortunately I do not see any evidence of active infection locally or systemically which is great news and overall I am extremely pleased with where things stand currently. Electronic Signature(s) Signed: 08/07/2021 10:22:10 AM By: [name] PA-C Entered By: [name] on 08/07/2021 10:22:10 -------------------------------------------------------------------------------- Physician Orders Details Patient Name: Date of Service: [facility] [name], [name] 08/07/2021 9:30 A M Medical Record Number: [id number] Patient Account Number: [id number] Date of Birth/Sex: Treating RN: [birth year] (66 y.o. 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 Margarita Grizzle (Room 7, Leveda Anna) Return A Cellular or Product/process development scientist or Tissue Based Product Type: - Puraply AM #2 07/10/2021 Puraply AM for #3 07/17/2021 Puraply AM for #4 07/24/2021 Bathing/ Shower/ Hygiene May shower with protection but do not get wound dressing(s) wet. Edema Control - Lymphedema / SCD / Other Bilateral Lower Extremities Elevate legs to the level of the heart or above for 30 minutes daily and/or  when sitting, a frequency of: - throughout the day when sitting Avoid standing for long periods of time. Patient to wear own compression stockings every day. Exercise regularly Moisturize legs daily. Non Wound Condition pply the following to affected area as directed: - Apply silicone bordered foam to left lateral foot for protection A Wound Treatment Wound #15R - Malleolus Wound Laterality: Left, Medial Cleanser: Wound Cleanser (Generic) 1 x Per Day/15 Days Discharge Instructions: Cleanse the wound with wound cleanser prior to applying a clean dressing using gauze sponges, not tissue or  cotton balls. Peri-Wound Care: Sween Lotion (Moisturizing lotion) 1 x Per Day/15 Days Discharge Instructions: Apply moisturizing lotion as directed Prim Dressing: Xeroform Occlusive Gauze Dressing, 4x4 in 1 x Per Day/15 Days ary Discharge Instructions: Apply to wound bed as instructed Secondary Dressing: Woven Gauze Sponge, Non-Sterile 4x4 in (Generic) 1 x Per Day/15 Days Discharge Instructions: Apply over primary dressing as directed. Secondary Dressing: ABD Pad, 8x10 1 x Per Day/15 Days Discharge Instructions: Apply over primary dressing as directed. Compression Wrap: CoFlex TLC XL 2-layer Compression System 4x7 (in/yd) 1 x Per Day/15 Days Discharge Instructions: Apply CoFlex 2-layer compression as directed. (alt for 4 layer) Electronic Signature(s) Signed: 08/07/2021 3:53:22 PM By: [name] PA-C Signed: 08/07/2021 4:30:06 PM By: [name] Entered By: [name] on 08/07/2021 10:11:10 -------------------------------------------------------------------------------- Problem List Details Patient Name: Date of Service: [name], [name] 08/07/2021 9:30 A M Medical Record Number: [id number] Patient Account Number: [id number] Date of Birth/Sex: Treating RN: 1956-02-06 (66 y.o. 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 08/07/2021 9:30 A M Medical Record Number: [id number] Patient Account Number: [id number] Date of Birth/Sex: Treating RN: Feb 04, 1956 (66 y.o. F) Primary Care Provider: [name] Other Clinician: Referring Provider: Treating Provider/Extender: [name] in Treatment: 2 Subjective Chief Complaint Information obtained from Patient patient is been followed long-term in this clinic for venous insufficiency ulcers with inflammation, hypertension and ulceration over the medial ankle bilaterally. 01/17/2020; this is a patient who is here for review of postoperative wounds on the left lateral ankle and recurrence of venous stasis ulceration on the left medial History of Present Illness (HPI) the remaining wound is over the left medial ankle. Similar wound over the right medial ankle healed largely with use of Apligraf. Most recently we have been using Hydrofera Blue  over this wound with considerable improvement. The patient has been extensively worked up in the past for her venous insufficiency and she is not a candidate for antireflux surgery although I have none of the details available currently. 08/24/14; considerable improvement today. About 50% of this wound areas now epithelialized. The base of the wound appears to be healthier granulation.as opposed to last week when she had deteriorated a considerable improvement 08/17/14; unfortunately the wound has regressed somewhat. The areas of epithelialization from the superior aspect are not nearly as healthy as they were last week. The patient thinks her Hydrofera Blue slipped. 09/07/14; unfortunately the area has markedly regressed in the 2 weeks since I've seen this. There is an odor surrounding erythema. The healthy granulation tissue that we had at the base of the wound now is a dusky color. The nurse reports green drainage 09/14/14; the area  looks somewhat better than last week. There is less erythema and less drainage. The culture I did did not show any growth. Nevertheless I think it is better to continue the Cipro and doxycycline for a further week. The remaining wound area was debridement. 09/21/14. Wound did not require debridement last week. Still less erythema and less drainage. She can complete her antibiotics. The areas of epithelialization in the superior aspect of the wound do not look as healthy as they did some weeks ago 10/05/14 continued improvement in the condition of this wound. There is advancing epithelialization. Less aggressive debridement required 10/19/14 continued improvement in the condition and volume of this wound. Less aggressive debridement to the inferior part of this to remove surface slough and fibrinous eschar 11/02/14 no debridement is required. The surface granulation appears healthy although some of her islands of epithelialization seem to have regressed. No evidence of  infection 11/16/14; lites surface debridement done of surface eschar. The wound does not look to be unhealthy. No evidence of infection. Unfortunately the patient has had podiatry issues in the right foot and for some reason has redeveloped small surface ulcerations in the medial right ankle. Her original presentation involved wounds in this area 11/23/14 no debridement. The area on the right ankle has enlarged. The left ankle wound appears stable in terms of the surface although there is periwound inflammation. There has been regression in the amount of new skin 11/30/14 no debridement. Both wound areas appear healthy. There was no evidence of infection. The the new area on the right medial ankle has enlarged although that both the surfaces appear to be stable. 12/07/14; Debridement of the right medial ankle wound. No no debridement was done on the left. 12/14/14 no major change in and now bilateral medial ankle wounds. Both of these are very painful but the no overt evidence of infection. She has had previous venous ablation 12/21/14; patient states that her right medial ankle wound is considerably more painful last week than usual. Her left is also somewhat painful. She could not tolerate debridement. The right medial ankle wound has fibrinous surface eschar 12/28/14 this is a patient with severe bilateral venous insufficiency ulcers. For a considerable period of time we actually had the one on the right medial ankle healed however this recently opened up again in June. The left medial ankle wound has been a refractory area with some absent flows. We had some success with Hydrofera Blue on this area and it literally closed by 50% however it is recently opened up Foley. Both of these were debridement today of surface eschar. She tolerates this poorly 01/25/15: No change in the status of this. Thick adherent escar. Very poor tolerance of any attempt at debridement. I had healed the right medial  malleolus wound for a considerable amount of time and had the left one down to about 50% of the volume although this is totally regressed over the last 48 weeks. Further the right leg has reopened. she is trying to make a appointment with pain and vascular, previous ablations with Dr. Aleda Grana. I do not believe there is an arterial insufficiency issue here 02/01/15 the status of the adherent eschar bilaterally is actually improved. No debridement was done. She did not manage to get vascular studies done 02/08/15 continued debridement of the area was done today. The slough is less adherent and comes off with less pressure. There is no surrounding infection peripheral pulses are intact 02/15/15 selective debridement with a disposable curette. Again the slough  is less adherent and comes off with less difficulty. No surrounding infection peripheral pulses are intact. 02/22/15 selective debridement of the right medial ankle wound. Slough comes off with less difficulty. No obvious surrounding infection peripheral pulses are intact I did not debridement the one on the left. Both of these are stable to improved 03/01/15 selective debridement of both wound areas using a curette to. Adherent slough cup soft with less difficulty. No obvious surrounding infection. The patient tells me that 2 days ago she noted a rash above the right leg wrap. She did not have this on her lower legs when she change this over she arrives with widespread left greater than right almost folliculitis-looking rash which is extremely pruritic. I don't see anything to culture here. There is no rash on the rest of her body. She feels well systemically. 03/08/15; selective debridement of both wounds using a curette. Base of this does not look unhealthy. She had limegreen drainage coming out of the left leg wound and describes a lot of drainage. The rash on her left leg looks improved to. No cultures were done. 03/22/15; patient was not here  last week. Basal wounds does not look healthy and there is no surrounding erythema. No drainage. There is still a rash on the left leg that almost looks vasculitic however it is clearly limited to the top of where the wrap would be. 04/05/15; on the right required a surgical debridement of surface eschar and necrotic subcutaneous tissue. I did not debridement the area on the left. These continue to be large open wounds that are not changing that much. We were successful at one point in healing the area on the right, and at the same time the area on the left was roughly half the size of current measurements. I think a lot of the deterioration has to do with the prolonged time the patient is on her feet at work 04/19/15 I attempted-like surface debridement bilaterally she does not tolerate this. She tells me that she was in allergic care yesterday with extreme pain over her left lateral malleolus/ankle and was told that she has an "sprain" 05/03/15; large bilateral venous insufficiency wounds over the medial malleolus/medial aspect of her ankles. She complains of copious amounts of drainage and his usual large amounts of pain. There is some increasing erythema around the wound on the right extending into the medial aspect of her foot to. historically she came in with these wounds the right one healed and the left one came down to roughly half its current size however the right one is reopened and the left is expanded. 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 05/10/15 large bilateral venous insufficiency wounds. There is less adherence surface left however the surface culture that I did last week grew pseudomonas therefore bilateral selective debridement score necessary. There is surrounding erythema. The patient describes severe bilateral drainage and a lot of pain in the left ankle. Apparently her podiatrist was were ready to do a cortisone  shot 05/17/15; the patient complains of pain and again copious amounts of drainage. 05/24/15; we used Iodo flex last week. Patient notes considerable improvement in wound drainage. Only needed to change this once. 05/31/15; we continued Iodoflex; the base of these large wounds bilaterally is not too bad but there is probably likely a significant bioburden here. I would like to debridement just doesn't tolerate it. 06/06/14 I would like to continue  the Iodoflex although she still hasn't managed to obtain supplies. She has bilateral medial malleoli or large wounds which are mostly superficial. Both of them are covered circumferentially with some nonviable fibrinous slough although she tolerates debridement very poorly. She apparently has an appointment for an ablation on the right leg by interventional radiology. 06/14/15; the patient arrives with the wounds and static condition. We attempted a debridement although she does not do well with this secondary to pain. I 07/05/15; wounds are not much smaller however there appears to be a cleaner granulating base. The left has tight fibrinous slough greater than the right. Debridement is tolerated poorly due to pain. Iodoflex is done more for these wounds in any of the multitude of different dressings I have tried on the left 1 and then subsequently the right. 07/12/15; no change in the condition of this wound. I am able to do an aggressive debridement on the right but not the left. She simply cannot tolerate it. We have been using Iodoflex which helps somewhat. It is worthwhile remembering that at one point we healed the right medial ankle wound and the left was about 25% of the current circumference. We have suggested returning to vascular surgery for review of possible further ablations for one reason or another she has not been able to do this. 07/26/15 no major change in the condition of either wound on her medial ankle. I did not attempt to debridement of  these. She has been aggressively scrubbing these while she is in the shower at home. She has her supply of Iodoflex which seems to have done more for these wounds then anything I have put on recently. 08/09/15 wound area appears larger although not verified by measurements. Using Iodoflex 09/05/2015 -- she was here for avisit today but had significant problems with the wound and I was asked to see her for a physician opinion. I have summarize that this lady has had surgery on her left lower extremity about 10 years ago where the possible veins stripping was done. She has had an opinion from interventional radiology around November 2016 where no further sclerotherapy was ordered. The patient works 12 hours a day and stands on a concrete floor with work boots and is unable to get the proper compression she requires and cannot elevate her limbs appropriately at any given time. She has recently grown Pseudomonas from her wound culture but has not started her ciprofloxacin which was called in for her. 09/13/15 this continues to be a difficult situation for this patient. At one point I had this wound down to a 1.5 x 1.5" wound on her left leg. This is deteriorated and the right leg has reopened. She now has substantial wounds on her medial calcaneus, malleoli and into her lower leg. One on the left has surface eschar but these are far too painful for me to debridement here. She has a vascular surgery appointment next week to see if anything can be done to help here. I think she has had previous ablations several years ago at [facility]. She has no major edema. She tells me that she did not get product last time [facility] Ag] and went for several days without it. She continues to work in work boots 12 hours a day. She cannot get compression/4-layer under her work boots. 09/20/15 no major change. Periwound edema control was not very good. Her point with pain and vascular is next Wednesday the 25th 09/28/15; the  patient is seen vascular surgery and  is apparently scheduled for repeat duplex ultrasounds of her bilateral lower legs next week. 10/05/15; the patient was seen by Dr. Doren Custard of vascular surgery. He feels that she should have arterial insufficiency excluded as cause/contributed to her nonhealing stage she is therefore booked for an arteriogram. She has apparently monophasic signals in the dorsalis pedis pulses. She also of course has known severe chronic venous insufficiency with previous procedures as noted previously. I had another long discussion with the patient today about her continuing to work 12 hour shifts. I've written her out for 2 months area had concerns about this as her work location is currently undergoing significant turmoil and this may lead to her termination. She is aware of this however I agree with her that she simply cannot continue to stand for 12 hours multiple days a week with the substantial wound areas she has. 10/19/15; the Dr. Doren Custard appointment was largely for an arteriogram which was normal. She does not have an arterial issue. He didn't make a comment about her chronic venous insufficiency for which she has had previous ablations. Presumably it was not felt that anything additional could be done. The patient is now out of work as I prescribed 2 weeks ago. Her wounds look somewhat less aggravated presumably because of this. I felt I would give debridement another try today 10/25/15; no major change in this patient's wounds. We are struggling to get her product that she can afford into her own home through her insurance. 11/01/15; no major change in the patient's wounds. I have been using silver alginate as the most affordable product. I spoke to Dr. Marla Roe last week with her requested take her to the OR for surgical debridement and placement of ACEL. Dr. Marla Roe told me that she would be willing to do this however [facility] will not cover this, fortunately  the patient has [place] healthcare of some variant 11/08/15; no major change in the patient's wounds. She has been completely nonviable surface that this but is in too much pain with any attempted debridement are clinic. I have arranged for her to see Dr. Marla Roe ham of plastic surgery and this appointment is on Monday. I am hopeful that they will take her to the OR for debridement, possible ACEL ultimately possible skin graft 11/22/15 no major change in the patient's wounds over her bilateral medial calcaneus medial malleolus into the lower legs. Surface on these does not look too bad however on the left there is surrounding erythema and tenderness. This may be cellulitis or could him sleepy tinea. 11/29/15; no major changes in the patient's wounds over her bilateral medial malleolus. There is no infection here and I don't think any additional antibiotics are necessary. There is now plan to move forward. She sees Dr. Marla Roe in a week's time for preparation for operative debridement and ACEL placement I believe on 7/12. She then has a follow-up appointment with Dr. Marla Roe on 7/21 12/28/15; the patient returns today having been taken to the [facility] by Dr. Marla Roe 12/12/15 she underwent debridement, intraoperative cultures [which were negative]. She had placement of a wound VAC. Parent really ACEL was not available to be placed. The wound VAC foam apparently adhered to the wound since then she's been using silver alginate, Xeroform under Ace wraps. She still says there is a lot of drainage and a lot of pain 01/31/16; this is a patient I see monthly. I had referred her to Dr. Marla Roe him of plastic surgery for large wounds on  her bilateral medial ankles. She has been to the OR twice once in early July and once in early August. She tells me over the last 3 weeks she has been using the wound VAC with ACEL underneath it. On the right we've simply been using silver alginate. Under Kerlix Coban  wraps. 02/28/16; this is a patient I'm currently seeing monthly. She is gone on to have a skin graft over her large venous insufficiency ulcer on the left medial ankle. This was done by Dr. Marla Roe him. The patient is a bit perturbed about why she didn't have one on her right medial ankle wound. She has been using silver alginate to this. 03/06/16; I received a phone call from her plastic surgery Dr. Marla Roe. She expressed some concern about the viability of the skin graft she did on the left medial ankle wound. Asked me to place Endoform on this. She told me she is not planning to do a subsequent skin graft on the right as the left one did not take very well. I had placed Hydrofera Blue on the right 03/13/16; continue to have a reasonably healthy wound on the right medial ankle. Down to 3 mm in terms of size. There is epithelialization here. The area on the left medial ankle is her skin graft site. I suppose the last week this looks somewhat better. She has an open area inferiorly however in the center there appears to be some viable tissue. There is a lot of surface callus and eschar that will eventually need to come off however none of this looked to be infected. Patient states that the is able to keep the dressing on for several days which is an improvement. 03/20/16 no major change in the circumference of either wound however on the left side the patient was at Dr. Eusebio Friendly office and they did a debridement of left wound. 50% of the wound seems to be epithelialized. I been using Endoform on the left Hydrofera Blue in the right 03/27/16; she arrives today with her wound is not looking as healthy as they did last week. The area on the right clearly has an adherent surface to this a very similar surface on the left. Unfortunately for this patient this is all too familiar problem. Clearly the Endoform is not working and will need to change that today that has some potential to help this  surface. She does not tolerate debridement in this clinic very well. She is changing the dressing wants 04/03/16; patient arrives with the wounds looking somewhat better especially on the right. Dr. Migdalia Dk change the dressing to silver alginate when she saw her on Monday and also sold her some compression socks. The usefulness of the latter is really not clear and woman with severely draining wounds. 04/10/16; the patient is doing a bit of an experiment wearing the compression stockings that Dr. Migdalia Dk provided her to her left leg and the out of legs based dressings that we provided to the right. 05/01/16; the patient is continuing to wear compression stockings Dr. Migdalia Dk provided her on the left that are apparently silver impregnated. She has been using Iodoflex to the right leg wound. Still a moderate amount of drainage, when she leaves here the wraps only last for 4 days. She has to change the stocking on the left leg every night 05/15/16; she is now using compression stockings bilaterally provided by Dr. Marla Roe. She is wearing a nonadherent layer over the wounds so really I don't think there is anything specific  being done to this now. She has some reduction on the left wound. The right is stable. I think all healing here is being done without a specific dressing 06/09/16; patient arrives here today with not much change in the wound certainly in diameter to large circular wounds over the medial aspect of her ankle bilaterally. Under the light of these services are certainly not viable for healing. There is no evidence of surrounding infection. She is wearing compression stockings with some sort of silver impregnation as prescribed by Dr. Marla Roe. She has a follow-up with her tomorrow. 06/30/16; no major change in the size or condition of her wounds. These are still probably covered with a nonviable surface. She is using only her purchase stockings. She did see Dr. Marla Roe who seemed to want  to apply Dakin's solution to this I'm not extreme short what value this would be. I would suggest Iodoflex which she still has at home. 07/28/16; I follow Mrs. Dennen episodically along with Dr. Marla Roe. 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 She has been applying a topical collagen ointment to both wounds with Adaptic. I don't think Dr. Marla Roe is planning to take her back to the OR. 08/19/16; I follow Mrs. Jeneen Rinks on a monthly basis along with Dr. Marla Roe of plastic surgery. 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 I been following her for a number of years. At one point I was able to get the right medial malleolus wound to heal and had the left medial malleolus down to about half its current size however and I had to send her to plastic surgery for an operative debridement. Since then things have been stable to slightly improve the area on the right is slightly better one in the left about the same although there is much less adherent surface than I'm used to with this patient. She is using some form of liquid collagen gel that Dr. Marla Roe provided a Kerlix cover with the patient's own pressure stockings. She tells me that she has extreme pain in both ankles and along the lateral aspect of both feet. She has been unable to work for some period of time. She is telling me she is retiring at the beginning of April. She sees Dr. Doran Durand of orthopedics next week 09/22/16; patient has not seen Dr. Marla Roe since the last time she is here. I'm not really sure what she is using to the wounds other than bits and pieces of think she had left over including most recently Hydrofera Blue. She is using juxtalite stockings. She is having difficulty with her husband's recent illness "stroke". She is having to transport him to various doctors appointments. Dr. Marla Roe  left her the option of a repeat debridement with ACEL however she has not been able to get the time to follow-up on this. She continues to have a fair amount of drainage out of these wounds with certainly precludes leaving dressings on all week 10/13/16; patient has not seen Dr. Marla Roe since she was last in our clinic. I'm not really sure what she is doing with the wounds, we did try to get her [facility] and I think she is actually using this most of the time. Because of drainage she states she has to change this every second day although this is an improvement from what she used to do. She went to see Dr. Doran Durand who did not  think she had a muscular issue with regards to her feet, he referred her to a neurologist and I think the appointment is sometime in June. I changed her back to Iodoflex which she has used in the past but not recently. 11/03/16; the patient has been using Iodoflex although she ran out of this. Still claims that there is a lot of drainage although the wound does not look like this. No surrounding erythema. She has not been back to see Dr. Marla Roe 11/24/16; the patient has been using Iodoflex again but she ran out of it 2 or 3 days ago. There is no major change in the condition of either one of these wounds in fact they are larger and covered in a thick adherent surface slough/nonviable tissue especially on the left. She does not tolerate mechanical debridement in our clinic. Going back to see Dr. Marla Roe of plastic surgery for an operative debridement would seem reasonable. 12/15/16; the patient has not been back to see Dr. Marla Roe. She is been dealing with a series of illnesses and her husband which of monopolized her time. She is been using Sorbact which we largely supplied. She states the drainage is bad enough that it maximum she can go 2-3 days without changing the dressing 01/12/2017 -- the patient has not been back for about 4 weeks and has not seen Dr.  Marla Roe not does she have any appointment pending. 01/23/17; patient has not seen Dr. Marla Roe even though I suggested this previously. She is using Santyl that was suggested last week by Dr. Con Memos this Cost her $16 through her insurance which is indeed surprising 02/12/17; continuing Santyl and the patient is changing this daily. A lot of drainage. She has not been back to see plastic surgery she is using an Ace wrap. Our intake nurse suggested wrap around stockings which would make a good reasonable alternative 02/26/17; patient is been using Santyl and changing this daily due to drainage. She has not been to see plastic surgery she uses in April Ace wrap to control the edema. She did obtain extremitease stockings but stated that the edema in her leg was to big for these 03/20/17; patient is using Santyl and Anasept. Surfaces looked better today the area on the right is actually measuring a little smaller. She has states she has a lot of pain in her feet and ankles and is asking for a consult to pain control which I'll try to help her with through our case manager. 04/10/17; the patient arrives with better-looking wound surfaces and is slightly smaller wound on the left she is using a combination of Santyl and Anasept. She has an appointment or at least as started in the pain control center associated with [facility] 05/14/17; this is a patient who I followed for a prolonged period of time. She has venous insufficiency ulcers on her bilateral medial ankles. At one point I had this down to a much smaller wound on the left however these reopened and we've never been able to get these to heal. She has been using Santyl and Anasept gel although 2 weeks ago she ran out of the Anasept gel. She has a stable appearance of the wound. She is going to the wound care clinic at [facility]. They wanted do a nerve block/spinal block although she tells me she is reluctant to go forward with  that. 05/21/17; this is a patient I have followed for many years. She has venous insufficiency ulcers on her bilateral medial ankles.  Chronic pain and deformity in her ankles as well. She is been to see plastic surgery as well as orthopedics. Using PolyMem AG most recently/Kerramax/ABDs and 2 layer compression. She has managed to keep this on and she is coming in for a nurse check to change the dressing on Tuesdays, we see her on Fridays 06/05/17; really quite a good looking surface and the area especially on the right medial has contracted in terms of dimensions. Well granulated healthy-looking tissue on both sides. Even with an open curet there is nothing that even feels abnormal here. This is as good as I've seen this in quite some time. We have been using PolyMem AG and bringing her in for a nurse check 06/12/17; really quite good surface on both of these wounds. The right medial has contracted a bit left is not. We've been using PolyMem and AG and she is coming in for a nurse visit 06/19/17; we have been using PolyMem AG and bringing her in for a nurse check. Dimensions of her wounds are not better but the surfaces looked better bilaterally. She complained of bleeding last night and the left wound and increasing pain bilaterally. She states her wound pain is more neuropathic than just the wounds. There was some suggestion that this was radicular from her pain management doctor in talking to her it is really difficult to sort this out. 06/26/17; using PolyMem and AG and bringing her in for a nurse check as All of this and reasonably stable condition. Certainly not improved. The dimensions on the lateral part of the right leg look better but not really measuring better. The medial aspect on the left is about the same. 07/03/16; we have been using PolyMen AG and bringing her in for a nurse check to change the dressings as the wounds have drainage which precludes once weekly changing. We are using all  secondary absorptive dressings.our intake nurse is brought up the idea of using a wound VAC/snap VAC on the wound to help with the drainage to see if this would result in some contraction. This is not a bad idea. The area on the right medial is actually looking smaller. Both wounds have a reasonable-looking surface. There is no evidence of cellulitis. The edema is well controlled 07/10/17; the patient was denied for a snap VAC by her insurance. The major issue with these wounds continues to be drainage. We are using wicked PolyMem AG and she is coming in for a nurse visit to change this. The wounds are stable to slightly improved. The surface looks vibrant and the area on the right certainly has shrunk in size but very slowly 07/17/17; the patient still has large wounds on her bilateral medial malleoli. Surface of both of these wounds looks better. The dimensions seem to come and go but no consistent improvement. There is no epithelialization. We do not have options for advanced treatment products due to insurance issues. They did not approve of the wound VAC to help control the drainage. More recently we've been using PolyMem and AG wicked to allow drainage through. We have been bringing her in for a nurse visit to change this. We do not have a lot of options for wound care products and the home again due to insurance issues 07/24/17; the patient's wound actually looks somewhat better today. No drainage measurements are smaller still healthy-looking surface. We used silver collagen under PolyMen started last week. We have been bringing her in for a dressing change 07/31/17; patient's wound surface  continued to look better and I think there is visible change in the dimensions of the wound on the right. Rims of epithelialization. We have been using silver collagen under PolyMen and bringing her in for a dressing change. There appears to be less drainage although she is still in need of the dressing  change 08/07/17. Patient's wound surface continues to look better on both sides and the area on the right is definitely smaller. We have been using silver collagen and PolyMen. She feels that the drainage has been it has been better. I asked her about her vascular status. She went to see Dr. Aleda Grana at [facility] and had some form of ablation. I don't have much detail on this. I haven't my notes from 2016 that she was not a candidate for any further ablation but I don't have any more information on this. We had referred her to vein and vascular I don't think she ever went. He does not have a history of PAD although I don't have any information on this either. We don't even have ABIs in our record 08/14/17; we've been using silver collagen and PolyMen cover. And putting the patient and compression. She we are bringing her in as a nurse visit to change this because ofarge amount of drainage. We didn't the ABIs in clinic today since they had been done in many moons 1.2 bilaterally. She has been to see vein and vascular however this was at [facility] and she had ablation although I really don't have any information on this all seemed biking get a report. She is also been operatively debrided by plastic surgery and had a cell placed probably 8-12 months ago. This didn't have a major effect. We've been making some gains with current dressings 08/19/17-She is here in follow-up evaluation for bilateral medial malleoli ulcers. She continues to tolerate debridement very poorly. We will continue with recently changed topical treatment; if no significant improvement may consider switching to Iodosorb/Iodoflex. 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 She has been using silver collagen and PolyMem. I believe she has been used and tried on Iodoflex before. 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 These have been very refractory over time. We have been using silver collagen and PolyMen. She is coming in once a week for a doctor's and once a week for nurse visits. We are actually making some progress 09/18/17; the patient's wounds are smaller especially on the right medial. She arrives today to upset to consider even washing these off with Anasept which I think is been part of the reason this is been closing. We've been using collagen covered in PolyMen otherwise. It is noted that she has a small area of folliculitis on the right medial calf that. As we are wrapping her legs I'll give her a short course of doxycycline to make sure this doesn't amount to anything. She is a long list of complaints today including imbalance, shortness of breath on exertion, inversion of her left ankle. With regards to the latter complaints she is been to see orthopedics and they offered her a tendon release surgery I believe but wanted her wounds to be closed first. I have recommended she go see her primary physician with regards to everything else. 09/25/17; patient's wounds are  about the same size. We have made some progress bilaterally although not in recent weeks. She will not allow me T wash these wounds with Anasept even if she is doing her cell. Wheeze we've been using collagen covered in PolyMen. Last week she had a small area of folliculitis this is now opened into a small wound. She completed 5 days of trimethoprim sulfamethoxazole 10/02/17; unfortunately the area on her left medial ankle is worse with a larger wound area towards the Achilles. The patient complains of a lot of pain. She will not allow debridement although visually I don't think there is anything to debridement in any case. We have been using silver collagen and PolyMen for several months now. Initially we  are making some progress although I'm not really seeing that today. We will move back to [facility]. His admittedly this is a bit of a repeat however I'm hoping that his situation is different now. The patient tells me she had her leg on the left give out on her yesterday this is process some pain. 10/09/17; the patient is seen twice a week largely because of drainage issues coming out of the chronic medial bimalleolar wounds that are chronic. Last week the dimensions of the one on the left looks a little larger I changed her to [facility]. She comes in today with a history of terrible pain in the bilateral wound areas. She will not allow debridement. She will not even allow a tissue culture. There is no surrounding erythema no no evidence of cellulitis. We have been putting her Kerlix Coban man. She will not allow more aggressive compression as there was a suggestion to put her in 3 layer wraps. 10/16/17; large wounds on her bilateral medial malleoli. These are chronic. Not much change from last week. The surface looks have healthy but absolutely no epithelialization. A lot of pain little less so of drainage. She will not allow debridement or even washing these off in the vigorous fashion with Anasept. 10/23/17; large wounds on her bilateral malleoli which are chronic. Some improvement in terms of size perhaps on the right since last time I saw these. She states that after we increased the 3 layer compression there was some bleeding, when she came in for a nurse visit she did not want 3 layer compression put back on about our nurse managed to convince her. She has known chronic venous visit issues and I'm hoping to get her to tolerate the 3 layer compression. using Hydrofera Blue 10/30/17; absolutely no change in the condition of either wound although we've had some improvement in dimensions on the right.. Attempted to put her in 3 layer compression she didn't tolerated she is back in 2 layer  compression. We've been using Hydrofera Blue We looked over her past records. She had venous reflux studies in November 2016. There was no evidence of deep venous reflux on the right. Superficial vein did not show the greater saphenous vein at think this is been previously ablated the small saphenous vein was within normal limits. The left deep venous system showed no DVT the vessels were positive for deep venous reflux in the posterior tibial veins at the ankle. The greater saphenous vein was surgically absent small saphenous vein was within normal limits. She went to vein and vascular at [facility]. I believe she had an ablation on the left greater saphenous vein. I'll update her reflux studies perhaps ever reviewed by vein and vascular. We've made absolutely no progress in these  wounds. Will also try to read and TheraSkins through her insurance 11/06/17; W the patient apparently has a 2 week follow-up with vein and vascular I like him to review the whole issue with regards to her previous vascular workup by Dr. Aleda Grana. We've really made no progress on these wounds in many months. She arrives today with less viable looking surface on the left medial ankle wound. This was apparently looking about the same on Tuesday when she was here for nurse visit. 11/13/17; deep tissue culture I did last time of the left lower leg showed multiple organisms without any predominating. In particular no Staphylococcus or group A strep were isolated. We sent her for venous reflux studies. She's had a previous left greater saphenous vein stripping and I think sclerotherapy of the right greater saphenous vein. She didn't really look at the lesser saphenous vein this both wounds are on the medial aspect. She has reflux in the common femoral vein and popliteal vein and an accessory vein on the right and the common femoral vein and popliteal vein on the left. I'm going to have her go to see vein and vascular just the  look over things and see if anything else beside aggressive compression is indicated here. We have not been able to make any progress on these wounds in spite of the fact that the surface of the wounds is never look too bad. 11/20/17; no major change in the condition of the wounds. Patient reports a large amount of drainage. She has a lot of complaints of pain although enlisting her today I wonder if some of this at least his neuropathic rather than secondary to her wounds. She has an appointment with vein and vascular on 12/30/17. The refractory nature of these wounds in my mind at least need vein and vascular to look over the wounds the recent reflux studies we did and her history to see if anything further can be done here. I also note her gait is deteriorated quite a bit. 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 She has a bilateral Trendelenburg gait. I wonder if this is neuropathic or perhaps multilevel radicular. 11/27/17; her wounds actually looks slightly better. Healthy-looking granulation tissue a scant amount of epithelialization. [place] healthcare will not pay for [employer]. They will play for tri layer Oasis and Dermagraft. This is not a diabetic ulcer. We'll try for the tri layer Oasis. She still complains of some drainage. She has a vein and vascular appointment on 12/30/17 12/04/17; the wounds visually look quite good. Healthy-looking granulation with some degree of epithelialization. We are still waiting for response to our request for trial to try layer Oasis. Her appointment with vascular to review venous and arterial issues isn't sold the end of July 7/31. Not allow debridement or even vigorous cleansing of the wound surface. 12/18/17; slightly smaller especially on the right. 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 We've been using Hydrofera Blue. We still are looking into triple layer Oasis through her insurance 01/08/18 on evaluation today  patient's wound actually appears to be showing signs of good improvement at this point in time. She has been tolerating the dressing changes without complication. Fortunately there does not appear to be any evidence of infection at this point in time. We have been utilizing silver nitrate which does seem to be of benefit for her which is also good news. Overall I'm very happy with how things seem to be both regards appearance as well as  measurement. Patient did see Dr. Bridgett Larsson for evaluation on 12/30/17. In his assessment he felt that stripping would not likely add much more than chronic compression to the patient's healing process. His recommendation was to follow-up in three months with Dr. Doren Custard if she hasn't healed in order to consider referral back to you and see vascular where she previously was in a trial and was able to get her wound to heal. I'll be see what she feels she when you staying compression and he reiterated this as well. 01/13/18 on evaluation today patient appears to actually be doing very well in regard to her bilateral medial malleolus ulcers. She seems to have tolerated the chemical cauterization with silver nitrate last week she did have some pain through that evening but fortunately states that I'll be see since it seems to be doing better she is overall pleased with the progress. 01/21/18; really quite a remarkable improvement since I've last seen these wounds. We started using silver nitrate specially on the islands of hyper granulation which for some reason her around the wound circumference. This is really done quite nicely. Primary dressing Hydrofera Blue under 4 layer compression. She seems to be able to hold out without a nurse rewrap. Follow-up in 1 week 01/28/18; we've continued the hydrofera blue but continued with chemical cauterization to the wound area that we started about a month ago for irregular hyper granulation. She is made almost stunning improvement in the  overall wound dimensions. I was not really expecting this degree of improvement in these chronic wounds 02/05/18; we continue with Hydrofera Bluebut of also continued the aggressive chemical cauterization with silver nitrate. We made nice progress with the right greater than left wound. 02/12/18. We continued with Hydrofera Blue after aggressive chemical cauterization with silver nitrate. We appear to be making nice progress with both wound areas 02/19/2018; we continue with [facility] after washing the wounds vigorously with Anasept spray and chemical cauterization with silver nitrate. We are making excellent progress. The area on the right's just about closed 02/26/2018. The area on the left medial ankle had too much necrotic debris today. I used a #5 curette we are able to get most of the soft. I continued with the silver nitrate to the much smaller wound on the right medial ankle she had a new area on her right lower pretibial area which she says was due to a role in her compression 03/05/2018; both wound areas look healthy. Not much change in dimensions from last week. I continue to use silver nitrate and Hydrofera Blue. The patient saw Dr. Doren Custard of vein and vascular. He felt she had venous stasis ulcers. He felt based on her previous arteriogram she should have adequate circulation for healing. Also she has deep venous reflux but really no significant correctable superficial venous reflux at this time. He felt we should continue with conservative management including leg elevation and compression 04/02/2018; since we last saw this woman about a month ago she had a fall apparently suffered a pelvic fracture. I did not look up the x-ray. Nevertheless because of pain she literally was bedbound for 2 weeks and had home health coming out to change the dressing. Somewhat predictably this is resulted in considerable improvement in both wound areas. The right is just about closed on the medial malleolus  and the left is about half the size. 04/16/2018; both her wounds continue to go down in size. Using Hydrofera Blue. 05/07/18; both her wounds appeared to be improving especially  on the right where it is almost closed. We are using Hydrofera Blue 05/14/2018; slightly worse this week with larger wounds. Surface on the left medial not quite as good. We have been using Hydrofera Blue 05/21/18; again the wounds are slightly larger. Left medial malleolus slightly larger with eschar around the circumference. We have been using Hydrofera Blue undergoing a wraps for a prolonged period of time. This got a lot better when she was more recumbent due to a fall and a back injury. I change the primary dressing the silver alginate today. She did not tolerate a 4 layer compression previously although I may need to bring this up with her next time 05/28/2018; area on the left medial malleolus again is slightly larger with more drainage. Area on the right is roughly unchanged. She has a small area of folliculitis on the right medial just on the lower calf. This does not look ominous. 06/03/2018 left medial malleolus slightly smaller in a better looking surface. We used silver nitrate on this last time with silver alginate. The area on the right appears slightly smaller 1/10; left medial malleolus slightly smaller. Small open area on the right. We used silver nitrate and silver alginate as of 2 weeks ago. We continue with the wound and compression. These got a lot better when she was off her feet 1/17; right medial malleolus wound is smaller. The left may be slightly smaller. Both surfaces look somewhat better. 1/24; both wounds are slightly smaller. Using silver alginate under Unna boots 1/31; both wounds appear smaller in fact the area on the right medial is just about closed. Surface eschar. We have been using silver alginate under Unna boots. The patient is less active now spends let much less time on her feet and I  think this is contributed to the general improvement in the wound condition 2/7; both wounds appear smaller. I was hopeful the right medial would be closed however there there is still the same small open area. Slight amount of surface eschar on the left the dimensions are smaller there is eschar but the wound edges appear to be free. We have been using silver alginate under Unna boot's 2/14; both wounds once again measure smaller. Circumferential eschar on the left medial. We have been using silver alginate under Unna boots with gradual improvement 2/21; the area on the right medial malleolus has healed. The area on the left is smaller. We have been using silver alginate and Unna boots. We can discharge wrapping the right leg she has 20/30 stockings at home she will need to protect the scar tissue in this area 2/28; the area on the right medial malleolus remains closed the patient has a compression stocking. The area on the left is smaller. We have been using silver alginate and Unna boots. 3/6 the area on the right medial ankle remains closed. Good edema control noted she is using her own compression stocking. The area on the left medial ankle is smaller. We have been managing this with silver alginate and Unna boots which we will continue today. 3/13; the area on the right medial ankle remains closed and I'm declaring it healed today. When necessary the left is about the same still a healthy-looking surface but no major change and wound area. No evidence of infection and using silver alginate under unna and generally making considerable improvement 3/27 the area on the right medial ankle remains closed the area on the left is about the same as last week. Certainly not  any worse we have been using silver alginate under an Unna boot 4/3; the area on the right medial ankle remains closed per the patient. We did not look at this wound. The wound on the left medial ankle is about the same surface looks  healthy we have been using silver alginate under an Unna boot 4/10; area on the right medial ankle remains closed per the patient. We did not look at this wound. The wound on the left medial ankle is slightly larger. The patient complains that the [facility] caused burning pain all week. She also told us that she was a lot more active this week. Changed her back to silver alginate 4/17; right medial ankle still closed per the patient. Left medial ankle is slightly larger. Using silver alginate. She did not tolerate Hydrofera Blue on this area 4/24; right medial ankle remains closed we have not look at this. The left medial ankle continues to get larger today by about a centimeter. We have been using silver alginate under Unna boots. She complains about 4 layer compression as an alternative. She has been up on her feet working on her garden 5/8; right medial ankle remains closed we did not look at this. The left medial ankle has increased in size about 100%. We have been using silver alginate under Unna boots. She noted increased pain this week and was not surprised that the wound is deteriorated 5/15; no major change in SA however much less erythema ( one week of doxy ocellulitis). 5/22-66 year old female returns at 1 week to the clinic for left medial ankle wound for which we have been using silver alginate under 3 layer compression She was placed on DOXY at last visit - the wound is wider at this visit. She is in 3 layer compression 5/29; change to [facility] last week. I had given her empiric doxycycline 2 weeks ago for a week. She is in 3 layer compression. She complains of a lot of pain and drainage on presentation today. 6/5; using Hydrofera Blue. I gave her doxycycline recently empirically for erythema and pain around the wound. Believe her cultures showed enterococcus which not would not have been well covered by doxycycline nevertheless the wound looks better and I don't feel  specifically that the enterococcus needs to be covered. She has a new what looks like a wrap injury on her lateral left ankle. 6/12; she is using Hydrofera Blue. She has a new area on the left anterior lower tibial area. This was a wrap injury last week. 6/19; the patient is using Hydrofera Blue. She arrived with marked inflammation and erythema around the wound and tenderness. 12/01/18 on evaluation today patient appears to be doing a little bit better based on what I'm hearing from the standpoint of lassos evaluation to this as far as the overall appearance of the wound is concerned. Then sometime substandard she typically sees Dr. Dellia Nims. Nonetheless overall very pleased with the progress that she's made up to this point. No fevers, chills, nausea, or vomiting noted at this time. 7/10; some improvement in the surface area. Aggressively debrided last week apparently. I went ahead with the debridement today although the patient does not tolerate this very well. We have been using Iodoflex. Still a fair amount of drainage 7/17; slightly smaller. Using Iodoflex. 7/24; no change from last week in terms of surface area. We have been using Iodoflex. Surface looks and continues to look somewhat better 7/31; surface area slightly smaller better looking surface. We  have been using Iodoflex. This is under Unna boot compression 8/7-Patient presents at 1 week with Unna boot and Iodoflex, wound appears better 8/14-Patient presents at 1 week with Iodoflex, we use the Unna boot, wound appears to be stable better.Patient is getting Botox treatment for the inversion of the foot for tendon release, Next week 8/21; we are using Iodoflex. Unna boot. The wound is stable in terms of surface area. Under illumination there is some areas of the wound that appear to be either epithelialized or perhaps this is adherent slough at this point I was not really clear. It did not wipe off and I was reluctant to debride this  today. 8/28; we are using Iodoflex in an Unna boot. Seems to be making good improvement. 9/4; using Iodoflex and wound is slightly smaller. 9/18; we are using Iodoflex with topical silver nitrate when she is here. The wound continues to be smaller 10/2; patient missed her appointment last week due to GI issues. She left and Iodoflex based dressing on for 2 weeks. Wound is about the same size about the size of a dime on the left medial lower 10/9 we have been using Iodoflex on the medial left ankle wound. She has a new superficial probable wrap injury on the dorsal left ankle 10/16; we have been using Hydrofera Blue since last week. This is on the left medial ankle 10/23; we have been using Hydrofera Blue since 2 weeks ago. This is on the left medial ankle. Dimensions are better 11/6; using Hydrofera Blue. I think the wound is smaller but still not closed. Left medial ankle 11/13; we have been using Hydrofera Blue. Wound is certainly no smaller this week. Also the surface not as good. This is the remanent of a very large area on her left medial ankle. 11/20; using Sorbact since last week. Wound was about the same in terms of size although I was disappointed about the surface debris 12/11; 3-week follow-up. Patient was on vacation. Wound is measuring slightly larger we have been using Sorbact. 12/18; wound is about the same size however surface looks better last week after debridement. We have been using Sorbact under compression 1/15 wound is probably twice the size of last time increased in length nonviable surface. We have been using Sorbact. She was running a mild fever and missed her appointment last week 1/22; the wound is come down in size but under illumination still a very adherent debris we have been Hydrofera Blue that I changed her to last week 1/29; dimensions down slightly. We have been using Hydrofera Blue 2/19 dimensions are the same however there is rims of epithelialization under  illumination. Therefore more the surface area may be epithelialized 2/26; the patient's wound actually measures smaller. The wound looks healthy. We have been using Hydrofera Blue. I had some thoughts about running Apligraf then I still may do that however this looks so much better this week we will delay that for now 3/5; the wound is small but about the same as last week. We have been using Hydrofera Blue. No debridement is required today. 3/19; the wound is about the size of a dime. Healthy looking wound even under illumination. We have been using Hydrofera Blue. No mechanical debridement is necessary 3/26; not much change from last week although still looks very healthy. We have been using Hydrofera Blue under Unna boots Patient was offered an ankle fusion by podiatry but not until the wound heals with a proceed with this. 4/9; the patient  comes in today with her original wound on the medial ankle looking satisfactory however she has some uncontrolled swelling in the middle part of her leg with 2 new open areas superiorly just lateral to the tibia. I think this was probably a wrap issue. She said she felt uncomfortable during the week but did not call in. We have been using Hydrofera Blue 4/16; the wound on the medial ankle is about the same. She has innumerable small areas superior to this across her mid tibia. I think this is probably folliculitis. She is also been working in the yard doing a lot of sweating 4/30; the patient issue on the upper areas across her mid tibia of all healed. I think this was excessive yard work if I remember. Her wound on the medial ankle is smaller. Some debris on this we have been using Hydrofera Blue under Unna boots 5/7; mid tibia. She has been using Hydrofera Blue under an Unna wrap. She is apparently going for her ankle surgery on June 3 10/28/19-Patient returns to clinic with the ankle wound, we are using Hydrofera Blue under Unna wrap, surgery is scheduled for  her left foot for June 3 so she will be back for nurse visit next week READMISSION 01/17/2020 Mrs. Palmatier is a 66 year old woman we have had in this clinic for a long period of time with severe venous hypertension and refractory wounds on her medial lower legs and ankles bilaterally. 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 When she was off her legs for a prolonged period example when she fell and suffered a compression fracture things would heal up quite nicely. She is now retired and we managed to heal up the right medial leg wound. The left one was very tiny last time I saw this although still refractory. 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 It got to the point where this was interfering with her walking and she elected to proceed with a ankle arthrodesis to straighten her her ankle and leave her with a functional outcome for mobilization. The patient was referred to Dr. Doren Custard and really this took some time to arrange. Dr. Doren Custard saw her on 12/07/2019. Once again he verified that she had no arterial issues. She had previously had an angiogram several years ago. Follow-up ABIs on the left showed an ABI of 1.12 with triphasic waveforms and a TBI of 0.92. She is felt to have chronic deep venous insufficiency but I do not think it was felt that anything could be done from about this from an ablation point of view. At the time Dr. Doren Custard saw this patient the wounds actually look closed via the pictures in his clinic. The patient finally underwent her surgery on 12/15/2019. This went reasonably well and there was a good anatomic outcome. She developed a small distal wound dehiscence on the lateral part of the surgical wound. However more problematically she is developed recurrence of the  wound on the medial left ankle. There are actually 2 wounds here one in the distal lower leg and 1 pretty much at the level of the medial malleolus. It is a more distal area that is more problematic. She has been using Hydrofera Blue which started on Friday before this she was simply Ace wrapping. There was a  culture done that showed Pseudomonas and she is on ciprofloxacin. A recent CNS on 8/11 was negative. The patient reports some pain but I generally think this is improving. She is using a cam boot completely nonweightbearing using a walker for pivot transfers and a wheelchair 8/24; not much improvement unfortunately she has a surgical wound on the lateral part in the venous insufficiency wound medially. The bottom part of the medial insufficiency wound is still necrotic there is exposed tendon here. We have been using Hydrofera Blue under compression. Her edema control is however better 8/31; patient in for follow-up of his surgical wound on the lateral part of her left leg and chronic venous insufficiency ulcers medially. We put her back in compression last week. She comes in today with a complaint of 3 or 4 days worth of increasing pain. She felt her cam walker was rubbing on the area on the back of her heel. However there is intense erythema seems more likely she has cellulitis. She had 2 cultures done when she was seeing podiatry in the postop. One of them in late July showed Pseudomonas and she received a course of ciprofloxacin the other was negative on 8/11 she is allergic to penicillin with anaphylactoid complaints of hives oral swelling via information in epic 9/9; when I saw this patient last week she had intense anterior erythema around her wound on the right lateral heel and ankle and also into the right medial heel. Some of this was no doubt drainage and her walker boot however I was convinced she had cellulitis. I gave her Levaquin and Bactrim she is finishing up on this now. She is  following up with Dr. [name] he saw her yesterday. He is taken her out of the walking boot of course she is still nonweightbearing. Her x-ray was negative for any worrisome features such as soft tissue air etc. Things are a lot better this week. She has home health. We have been using Hydrofera Blue under an [employer] which she put back on yesterday. I did not wrap her last week 9/17; her surrounding skin looks a lot better. In fact the area on the left lateral ankle has just a scant amount of eschar. The only remaining wound is the large area on the left medial ankle. Probably about 60% of this is healthy granulation at the surface however she has a significant divot distally. This has adherent debris in it. I been using debridement and silver collagen to try and get this area to fill-in although I do not think we have made much progress this week 9/24; the patient's wound on the left medial ankle looks a lot better. The deeper divot area distally still requires debridement but this is cleaning up quite nicely we have been using silver collagen. The patient is complaining of swelling in her foot and is worried that that is contributing to the nonhealing of the ankle wound. She is also complaining of numbness in her anterior toes 10/4; left medial ankle. The small area distally still has a divot with necrotic material that I have been debriding away. This has an undermining area. She is approved for Apligraf. She saw Dr. [name] her surgeon on 10/1. I think he declared himself is satisfied with the condition of things. Still nonweightbearing till the end of the month. 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 Her surgical wound is well closed. There is no evidence of infection 10/11; the patient arrived in clinic  today with the expectation that we be able to put an Apligraf on this area after debridement however she arrives with a relatively large amount of green drainage  on the dressing. The patient states that this started on Friday. She has not been systemically unwell. 10/19; culture I did last week showed both Enterococcus and Pseudomonas. I think this came in separate parts because I stopped her ciprofloxacin I gave her and prescribed her linezolid however now looking at the final culture result this is Pseudomonas which is resistant to quinolones. She has not yet picked up the linezolid apparently phone issues. We are also trying to get a topical antibiotic out of [place] in [place] they can be applied by home health. She is still having green drainage 10/16; the patient has her topical antibiotic from [facility] in [place]. This is a compounded gel with vancomycin and ciprofloxacin and gentamicin. We are applying this on the wound bed with silver alginate over the top with Unna boot wraps. She arrives in clinic today with a lot less ominous looking drainage although she is only use this topical preparation once the second time today. She sees Dr. [name] her surgeon on Friday she has home health changing the dressing 11/2; still using her compounded topical antibiotic under silver alginate. Surface is cleaning up there is less drainage. We had an Apligraf for her today and I elected to apply it. A light coating of her antibiotic 04/25/2020 upon evaluation today patient appears to be doing well with regard to her ankle ulcer. There is a little bit of slough noted on the surface of the wound I am can have to perform sharp debridement to clear this away today. With that being said other than that fact overall I feel like she is making progress and we do see some new epithelial growth. There is also some improvement in the depth of the wound and that distal portion. There is little bit of slough there as well. 12/7; 2-week follow-up. Apligraf #3. Dimensions are smaller. Closing in especially inferiorly. Still some surface debris. Still using the  [facility] topical antibiotic but I told her that I don't think this needs to be renewed 12/21; 2-week follow-up. Apligraf #4 dimensions are smaller. Nice improvement 06/05/2020; 2-week follow-up. The patient's wound on the left medial ankle looks really excellent. Nice granulation. Advancing epithelialization no undermining no evidence of infection. We would have to reapply for another Apligraf but with the condition of this wound I did not feel strongly about it. We used Hydrofera Blue under the same degree of compression. She follows up with Dr. [name] her surgeon a week Friday 06/13/2020 upon evaluation today patient appears to be doing excellent in regard to her wound. She has been tolerating the dressing changes without complication. Fortunately there is no signs of active infection at this time. No fevers, chills, nausea, vomiting, or diarrhea. She was using Hydrofera Blue last week. 06/20/2020 06/20/2020 on evaluation today patient actually appears to be doing excellent in regard to her wound. This is measuring better and looking much better as well. She has been using the collagen that seems to be doing better for her as well even though the [facility] was and is not sticking or feeling as rough on her wound. She did see Dr. [name] on Friday he is very pleased he also stated none of the hardware has shifted. That is great news 1/27; the patient has a small clean wound all that is remaining. I agree  that this is too small to really consider an Apligraf. Under illumination the surface is looking quite good. We have been using collagen although the dimensions are not any better this week 2/2; the patient has a small clean wound on the left medial ankle. Although this left of her substantial original areas. Measurements are smaller. We have been using polymen Ag under an Haematologist. 2/10; small area on the left medial ankle. This looks clean nothing to debride however dimensions are about the same we  have been using polymen I think now for 2 weeks 2/17; not much change in surface area. We have been using polymen Ag without any improvement. 3/17; 1 month follow-up. The patient has been using endoform without any improvement in fact I think this looks worse with more depth and more expansion 3/24; no improvement. Perhaps less debris on the surface. We have been using Sorbact for 1 week 4/4; wound measures larger. She has edema in her leg and her foot which she tells as her wrap came down. We have been using Unna boots. Sorbact of the wound. She has been approved for Apligraf 09/12/2020 upon evaluation today patient appears to be doing well with regard to her wound. We did get the Apligraf reapproved which is great news we have that available for application today. Fortunately there is no signs of infection and overall the patient seems to be doing great. The wound bed is nice and clean. 4/27; patient presents for her second application of Apligraf. She states over the past week she has been on her feet more often due to being outside in her garden. She has noted more swelling to her foot as a result. She denies increased warmth, pain or erythema to the wound site. 10/10/2020 upon evaluation today patient appears to be doing well with regard to her wound which does not appear to be quite as irritated as last week from what I am hearing. With that being said unfortunately she is having issues with some erythema and warmth to touch as well as an increase size. I do believe this likely is infected. 10/17/2020 upon evaluation today patient appears to be doing excellent in regard to her wound this is significantly improved as compared to last week. Fortunately I think that the infection is much better controlled at this point. She did have evidence of both Enterococcus as well as Staphylococcus noted on culture. Enterococcus really would not be helped significantly by the Cipro but the wound is doing so  much better I am under the assumption that the Staphylococcus is probably the main organism that is causing the current infection. Nonetheless I think that she is doing excellent as far as that is concerned and I am very pleased in that regard. I would therefore recommend she continue with the Cipro. 10/31/2020 upon evaluation today patient appears to be doing well with regard to her wound. She has been tolerating the dressing changes without complication. Fortunately there is no signs of active infection and overall I am extremely pleased with where things stand today. No fevers, chills, nausea, vomiting, or diarrhea. With that being said she does have some green drainage coming from the wound and although it looks okay I am a little concerned about the possibility of a continuing infection. Specifically with Pseudomonas. For that reason I will go ahead and send in a prescription for Cipro for her to be continued. 11/14/2020 upon evaluation today patient appears to be doing very well currently in regard to  her wound on her leg. She has been tolerating the dressing changes without complication. Fortunately I feel like the infection is finally under good control here. Unfortunately we do not have the Apligraf for application today although we can definitely order to have it in place for next week. That will be her fifth and final of the current series. Nonetheless I feel like her wound is really doing quite well which is great news. 11/21/2020 upon evaluation today patient appears to be doing well with regard to her wound on the medial ankle. Fortunately I think the infection is under control and I do believe we can go ahead and reapply the Apligraf today. She is in agreement with that plan. There does not appear to be any signs of active infection at this time which is great news. No fevers, chills, nausea, vomiting, or diarrhea. 12/05/2020 upon evaluation today patient's wound bed actually showed signs of  good granulation epithelization at this point. There does not appear to be any signs of infection which is great news and overall very pleased with where things stand. Overall the patient seems to be doing fairly well in my opinion with regard to her wound although I do believe she continues to build up a lot of biofilm I think she could benefit from using PuraPly at this point. 12/12/2020 upon evaluation today patient's wound actually appears to be doing decently well today. The Unna boot has not been quite as well-tolerated so that more uncomfortable for her and even causing some pressure over the plate on the lateral portion of her foot which is 90 where the wound is. There did not appear to be any significant deep tissue injury with that there may be a minimal change in the skin noted I think that we may want to go back to the Coflex 2 layer which is a little bit easier on her skin it seems. 12/19/2020 upon evaluation today patient actually seems to be making great progress with the PuraPly currently. She in fact seems to be much better as far as the overall appearance of the wound bed is concerned I am very happy in this regard. I do not see any signs of of infection which is great news as well. No fevers, chills, nausea, vomiting, or diarrhea. 12/26/2020 upon evaluation today patient appears to actually be doing better in regard to her wound on the left medial ankle region. The surface of the wound is actually doing significantly better which is great news. There does not appear to be any signs of infection which is also great news and in general I am extremely pleased with where we stand today. 01/02/2021 upon evaluation today patient appears to be doing well with regard to her wound. In fact this is showing signs of excellent improvement and very pleased with where things stand. In fact the last 3 appointments have all shown signs of this getting smaller which is excellent news. I have not even had  to perform any debridement and today is no exception. Overall I feel like this is dramatically improved compared to previous. T oday is PuraPly application #4. 2/75/1700 upon evaluation today patient appears to be doing excellent in regard to her wound this is continue to show signs of improvement and overall I am extremely pleased with where we stand today. She is actually here for PuraPly application #5. Every time we have applied this she is noted definite improvement on measurements. 01/23/2021 upon evaluation today patient is actually making good  progress in regard to her wound. This was actually on just a little bit longer this time compared to previous due to the fact that she did have to go out of town. She is actually here for PuraPly application #6. We have definitely been seeing improvements in the overall quality of the tissue on the surface of the wound which is awesome news. In general I think that the patient seems to be continuing to make great progress here. 01/30/2021 upon evaluation today patient's wound is actually doing excellent. There is really not any significant biofilm buildup which is great news and overall I am extremely pleased with where things stand today. There does not appear to be any signs of active infection. No fevers, chills, nausea, vomiting, or diarrhea. 02/06/2021 upon evaluation today patient's wound is actually showing signs of excellent improvement which is great news. We continue to see the benefit of the PuraPly this is doing a great job the wound seems not really irritated whatsoever and is showing signs of good granulation at this point. Overall I am extremely happy with what we are seeing. The patient likewise is happy to hear all of this as well. 02/13/2021 upon evaluation today patient appears to be doing well with regard to her wound. This again is measuring smaller today and I am very pleased in this regard. Fortunately there does not appear to be any  signs of active infection at this time which is good news from a systemic standpoint. Locally there is still some significant drainage which she does have is concerned about infection locally. No fevers, chills, nausea, vomiting, or diarrhea. 02/20/2021 upon evaluation today patient actually appears to be doing decently well in regard to her wound. She has been tolerating the dressing changes without complication. I do believe the PuraPly is helping wound bed does appear to be doing better. There is no evidence of active infection locally or systemically at this point visually although on fluorescence imaging there still appear to be bacterial load and bioburden noted. 02/27/2021 upon evaluation today patient fortunately continues to show signs of improvement with use of the PuraPly currently. Subsequently we did review her culture results and to be honest I think that the Bactrim is probably the best option to have her continue at this point. For that reason I am get a go ahead and send in a refill today for her for an additional 2 weeks. Nonetheless I think that we are making excellent progress here. It was Enterococcus and Staphylococcus that were noted she is allergic to penicillin so there is not much I can do from the Enterococcus standpoint though the staff does seem to be sensitive to the Bactrim. 03/06/2021 upon evaluation today patient appears to be doing well with regard to her wound. This is definitely showing signs of improvement which is great news and overall I am extremely pleased with where things stand today. There does not appear to be any signs of active infection which is great news and overall and I do believe that we are headed in the appropriate direction I think the PuraPly is doing an awesome job for her. 03/20/2021 upon evaluation today patient actually appears to be doing well with regard to her wound. We have been using the PuraPly although last week when I was out of the  office they actually just used endoform nonetheless this still seems to be doing great. Fortunately there does not appear to be any signs of active infection which is great news  overall I think the patient is making wonderful progress. 03/27/2021 upon evaluation today patient appears to be doing well with regard to her wound in fact there is a little drainage but otherwise her does not appear to be any major signs of open wounds nor infection at this time. Overall I am extremely pleased with where things stand. No fevers, chills, nausea, vomiting, or diarrhea. 04/03/2021 upon evaluation today patient presents for reevaluation here in the clinic and overall she seems to be doing quite well. Fortunately there does not appear to be any signs of active infection which is great news and in general I am very pleased with how things appear. There is still a very small opening remaining but in general she is looking much better. Fortunately I do not think there is any need for the PuraPly at this point. 04/10/2021 upon evaluation today patient's wound is actually showing signs of excellent improvement. I am actually very pleased with where it stands today. I think she is very close to complete closure in fact this may be healed today. Either way I think that she may benefit from using her own compression socks for the next week and then will subsequently see how things stand following. 04/17/2021 upon evaluation today patient appears to be doing well with regard to her wound on the ankle region. This is actually showing signs of excellent improvement which is great news. I do not see any signs of active infection at this time which is great and no open wound. With that being said on the lateral portion of her ankle right at the base of her plate she actually has an area of pressure that occurred over the past week when she was not in the wrap or we been padding her. This does seem to be an issue which I think is  good to be an ongoing problem I am can actually get in touch with Dr. [name] today to see what we can do in that regard. 04/24/2021 upon evaluation today patient appears to be doing well with regard to her ankle region for the most part although there does appear to be some fluid trapped underneath the area where we thought this was healed last week. Unfortunately I think at this time that we can have to reopen and clean away some of this area and then subsequently depending on how things do over the next week or so we will see about discharge but right now I do not think that is the right way to go. She voiced understanding. 05/01/2021 upon evaluation today patient's wound still appears to be open unfortunately. I do believe that we do need to see what we can do about trying to get this closed I believe compression wrapping is probably can to be indicated here. Fortunately there is no signs of active infection at this time. 05/08/2021 upon evaluation today patient appears to be doing okay in regard to her ulcer this is very superficial and we will see any major issues here. The biggest problem currently that I find is that we just cannot get this area to completely seal up. She seemed to almost be and then things digressed a little bit. Right now is not too bad but we do need to see if we can make some difference here. I think maybe switching over to collagen may be better. 05/15/2021 upon evaluation today patient actually appears to be doing pretty well in regard to her wound. This is measuring a little smaller  and looking much better. She did see orthopedics today there to me making her a insert for her shoe to try to help straighten out her gait. I think this can be beneficial for her as well. 05/22/2021 upon evaluation today patient appears to be doing well currently in regard to her wound although is not significantly smaller unfortunately this at least does not show any signs of active infection  at this time which is great news. No fevers, chills, nausea, vomiting, or diarrhea. I am going to see about switching over to endoform that is what we wanted to use in the past always been on backorder. 06/05/2021 upon evaluation today patient appears to be doing somewhat poorly in regard to her wound. This is really not showing the signs of improvement that I would have like to have seen. Fortunately I do not see any signs of active infection locally nor systemically which is great news. With that being said even though I do not see the sign she is having some increased discomfort which is not good. She is also having some issues here with the wound looking a little larger today compared to where its been. With that being said overall I believe that we probably need to consider at least that there may be a subacute infection here and that is what is going on. We have tried multiple different medications and dressings which have done well for her in the past without seeing any signs of improvement. She is in agreement with addressing this as such. 06/12/2020 upon evaluation today patient's wound is actually showing some signs of improvement today. In fact I do not even see any need for sharp debridement based on what I see at this point today. Fortunately there is no evidence of active infection currently which is great news. No fevers, chills, nausea, vomiting, or diarrhea. 06/26/2021 upon evaluation today patient appears to be doing well with regard to her wounds. Fortunately there does not appear to be any signs of active infection at this time which is great news. I do believe the Bactrim is doing well for her. Fortunately there does not appear to be any signs of active infection at this time. With the hardware in place and everything else I do believe extending this for 2 additional weeks for 4 weeks total would be advisable. 07/03/2021 upon evaluation today patient appears to be doing well with regard  to her wound. She has been tolerating the dressing changes which is great news. Fortunately there does not appear to be any evidence of active infection locally nor systemically at this time. 07/10/2021 upon evaluation today patient appears to be doing well with regard to her wound I do feel that the PuraPly has done an awesome job for her over the past week. Fortunately I do not see any signs of active infection at this time. No fevers, chills, nausea, vomiting, or diarrhea. 2/15; the patient's wound is an inverted cone. Most of the walls of the colon are epithelialized however the tip of the conus is still open. I gather this was considered to be quite an improvement.We reapplied Puraply in the standard fashion 07/24/2021 upon evaluation patient appears to be doing well with regard to her wound. She has been tolerating the dressing changes without complication which is great news. There is no signs of active infection at this time. 07/31/2021 upon evaluation today patient's wound is actually showing signs of improvement which is good news. Fortunately I do not see any evidence  of active infection locally nor systemically at this time which is great news. With that being said the biggest issue that I see is simply that she seems to have drainage that dries over the surface of the wound not allowing it to heal appropriately. That something that I think we need to work on to try to get this to fully closed. 08/07/2021 upon evaluation today patient appears to be doing awesome in regard to her wound. The Xeroform is actually doing a great job here. I do believe that we are on the right track and to be honest though I am going to perform some sharp debridement today this is much preferable to what we have seen in the past. There is no evidence of infection and the skin around appears to be doing awesome. Objective Constitutional Well-nourished and well-hydrated in no acute distress. Vitals Time Taken: 9:37 AM,  Height: 68 in, Temperature: 98.0 F, Pulse: 69 bpm, Respiratory Rate: 18 breaths/min, Blood Pressure: 110/57 mmHg. Respiratory normal breathing without difficulty. Psychiatric this patient is able to make decisions and demonstrates good insight into disease process. Alert and Oriented x 3. pleasant and cooperative. General Notes: Upon inspection patient's wound bed showed signs of good granulation and epithelization at this point. Fortunately I do not see any evidence of active infection locally or systemically which is great news and overall I am extremely pleased with where things stand currently. Integumentary (Hair, Skin) Wound #15R status is Open. Original cause of wound was Gradually Appeared. The date acquired was: 12/30/2019. The wound has been in treatment 81 weeks. The wound is located on the Left,Medial Malleolus. The wound measures 0.2cm length x 0.2cm width x 0.1cm depth; 0.031cm^2 area and 0.003cm^3 volume. There is Fat Layer (Subcutaneous Tissue) exposed. There is no tunneling or undermining noted. There is a medium amount of serosanguineous drainage noted. The wound margin is distinct with the outline attached to the wound base. There is large (67-100%) pink, pale granulation within the wound bed. There is no necrotic tissue within the wound bed. 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 There was a Selective/Open Wound Skin/Dermis Debridement with a total area of 0.04 sq cm performed by [name], PA. With the following instrument(s): Curette to remove  Non-Viable tissue/material. Material removed includes Skin: Dermis after achieving pain control using Lidocaine 4% Topical Solution. No specimens were taken. A time out was conducted at 10:03, prior to the start of the procedure. A Minimum amount of bleeding was controlled with Pressure. The procedure was tolerated well. Post Debridement Measurements: 0.2cm length x 0.2cm width x 0.1cm depth; 0.003cm^3 volume. Character of Wound/Ulcer Post Debridement is stable. Severity of Tissue Post Debridement is: Fat layer exposed. Post procedure Diagnosis Wound #15R: Same as Pre-Procedure Pre-procedure diagnosis of Wound #15R is a Venous Leg Ulcer located on the Left,Medial Malleolus . There was a Double Layer Compression Therapy Procedure by [name], RN. Post procedure Diagnosis Wound #15R: Same as Pre-Procedure Plan Follow-up Appointments: Return Appointment in 1 week. Margarita Grizzle (Room 7, [facility]) Cellular or Tissue Based Products: Cellular or Tissue Based Product Type: - Puraply AM #2 07/10/2021 Puraply AM for #  3 07/17/2021 Puraply AM for #4 07/24/2021 Bathing/ Shower/ Hygiene: May shower with protection but do not get wound dressing(s) wet. Edema Control - Lymphedema / SCD / Other: Elevate legs to the level of the heart or above for 30 minutes daily and/or when sitting, a frequency of: - throughout the day when sitting Avoid standing for long periods of time. Patient to wear own compression stockings every day. Exercise regularly Moisturize legs daily. Non Wound Condition: Apply the following to affected area as directed: - Apply silicone bordered foam to left lateral foot for protection WOUND #15R: - Malleolus Wound Laterality: Left, Medial Cleanser: Wound Cleanser (Generic) 1 x Per Day/15 Days Discharge Instructions: Cleanse the wound with wound cleanser prior to applying a clean dressing using gauze sponges, not tissue or cotton balls. Peri-Wound Care: Sween Lotion (Moisturizing lotion) 1 x Per  Day/15 Days Discharge Instructions: Apply moisturizing lotion as directed Prim Dressing: Xeroform Occlusive Gauze Dressing, 4x4 in 1 x Per Day/15 Days ary Discharge Instructions: Apply to wound bed as instructed Secondary Dressing: Woven Gauze Sponge, Non-Sterile 4x4 in (Generic) 1 x Per Day/15 Days Discharge Instructions: Apply over primary dressing as directed. Secondary Dressing: ABD Pad, 8x10 1 x Per Day/15 Days Discharge Instructions: Apply over primary dressing as directed. Com pression Wrap: CoFlex TLC XL 2-layer Compression System 4x7 (in/yd) 1 x Per Day/15 Days Discharge Instructions: Apply CoFlex 2-layer compression as directed. (alt for 4 layer) 1. Would recommend that we going continue with the wound care measures as before and the patient is in agreement the plan. This includes the use of the Xeroform gauze which I think is doing a great job. 2. We will get a continue with the ABD pad to cover followed by the Coflex 2 layer compression wrap. We will see patient back for reevaluation in 1 week here in the clinic. If anything worsens or changes patient will contact our office for additional recommendations. Electronic Signature(s) Signed: 08/07/2021 10:22:55 AM By: [name] PA-C Entered By: [name] on 08/07/2021 10:22:55 -------------------------------------------------------------------------------- SuperBill Details Patient Name: Date of Service: [facility] [name], [name] 08/07/2021 Medical Record Number: [id number] Patient Account Number: [id number] Date of Birth/Sex: Treating RN: 1955/12/22 (66 y.o. [name] Primary Care Provider: [name] Other Clinician: Referring Provider: Treating Provider/Extender: [name] in Treatment: 41 Diagnosis Coding ICD-10 Codes Code Description [phone] Chronic venous hypertension (idiopathic) with ulcer and inflammation of left lower extremity L97.828 Non-pressure chronic ulcer of other part  of left lower leg with other specified severity L97.328 Non-pressure chronic ulcer of left ankle with other specified severity Facility Procedures CPT4 Code: [id number] Description: 41324 - DEBRIDE WOUND 1ST 20 SQ CM OR < ICD-10 Diagnosis Description L97.828 Non-pressure chronic ulcer of other part of left lower leg with other specified se Modifier: verity Quantity: 1 Physician Procedures : CPT4 Code Description Modifier [id number] - WC PHYS DEBR WO ANESTH 20 SQ CM ICD-10 Diagnosis Description L97.828 Non-pressure chronic ulcer of other part of left lower leg with other specified severity Quantity: 1 Electronic Signature(s) Signed: 08/07/2021 10:23:06 AM By: [name] PA-C Entered By: [name] on 08/07/2021 10:23:06

## 2021-08-07 NOTE — Progress Notes (Signed)
[name], [name] ([id number]) Visit Report for 08/07/2021 Arrival Information Details Patient Name: Date of Service: [place] [name], [name] 08/07/2021 9:30 A M Medical Record Number: [id number] Patient Account Number: [id number] Date of Birth/Sex: Treating RN: [birth year] (66 y.o. [name] Primary Care Jamani Eley: [name] Other Clinician: Referring Gid Schoffstall: Treating [name]/Extender: [name] in Treatment: 31 Visit Information History Since Last Visit Added or deleted any medications: No Patient Arrived: Cane Any new allergies or adverse reactions: No Arrival Time: 09:35 Had a fall or experienced change in No Transfer Assistance: None activities of daily living that may affect Patient Identification Verified: Yes risk of falls: Secondary Verification Process Completed: Yes Signs or symptoms of abuse/neglect since last visito No Patient Requires Transmission-Based Precautions: No Hospitalized since last visit: No Patient Has Alerts: Yes Implantable device outside of the clinic excluding No Patient Alerts: L ABI =1.12, TBI = .92 cellular tissue based products placed in the center R ABI= 1.02, TBI= .58 since last visit: Has Dressing in Place as Prescribed: Yes Has Compression in Place as Prescribed: Yes Pain Present Now: No Electronic Signature(s) Signed: 08/07/2021 4:30:06 PM By: [name] Entered By: [name] on 08/07/2021 09:37:45 -------------------------------------------------------------------------------- Compression Therapy Details Patient Name: Date of Service: [name], [name] 08/07/2021 9:30 A M Medical Record Number: [id number] Patient Account Number: [id number] Date of Birth/Sex: Treating RN: July 31, 1955 (66 y.o. [name] Primary Care Alixis Harmon: [name] Other Clinician: Referring Renaye Janicki: Treating [name]/Extender: [name] in Treatment: 53 Compression Therapy Performed  for Wound Assessment: Wound #15R Left,Medial Malleolus Performed By: Clinician [name], RN Compression Type: Double Layer Post Procedure Diagnosis Same as Pre-procedure Electronic Signature(s) Signed: 08/07/2021 4:30:06 PM By: [name] Entered By: [name] on 08/07/2021 10:08:26 -------------------------------------------------------------------------------- Encounter Discharge Information Details Patient Name: Date of Service: [name], [name] 08/07/2021 9:30 A M Medical Record Number: [id number] Patient Account Number: [id number] Date of Birth/Sex: Treating RN: [birth year] (66 y.o. [name] Primary Care [name]: [name] Other Clinician: Referring [name]: Treating [name]/Extender: [name] in Treatment: 77 Encounter Discharge Information Items Post Procedure Vitals Discharge Condition: Stable Temperature (F): 98.0 Ambulatory Status: Cane Pulse (bpm): 69 Discharge Destination: Home Respiratory Rate (breaths/min): 18 Transportation: Private Auto Blood Pressure (mmHg): 110/57 Schedule Follow-up Appointment: Yes Clinical Summary of Care: Provided on 08/07/2021 Form Type Recipient Paper Patient Patient Electronic Signature(s) Signed: 08/07/2021 4:30:06 PM By: [name] Entered By: [name] on 08/07/2021 10:14:02 -------------------------------------------------------------------------------- Lower Extremity Assessment Details Patient Name: Date of Service: [name], [name] 08/07/2021 9:30 A M Medical Record Number: [id number] Patient Account Number: [id number] Date of Birth/Sex: Treating RN: Jul 07, 1955 (66 y.o. [name] Primary Care Dekari Bures: [name] Other Clinician: Referring [name]: Treating [name]/Extender: [name] in Treatment: 26 Edema Assessment Assessed: [Left: Yes] [Right: No] Edema: [Left: N] [Right: o] Calf Left: Right: Point of  Measurement: 31 cm From Medial Instep 27 cm Ankle Left: Right: Point of Measurement: 11 cm From Medial Instep 20.4 cm Vascular Assessment Pulses: Dorsalis Pedis Palpable: [Left:Yes] Electronic Signature(s) Signed: 08/07/2021 4:30:06 PM By: [name] Entered By: [name] on 08/07/2021 09:44:22 -------------------------------------------------------------------------------- Multi-Disciplinary Care Plan Details Patient Name: Date of Service: [name], [name] 08/07/2021 9:30 A M Medical Record Number: [id number] Patient Account Number: [id number] Date of Birth/Sex: Treating RN: 1955/06/08 (66 y.o. [name] Primary Care [name]: [name]  Other Clinician: Referring Merle Cirelli: Treating Alithia Zavaleta/Extender: [name] in Treatment: 107 Multidisciplinary Care Plan reviewed with physician Active Inactive Venous Leg Ulcer Nursing Diagnoses: Actual venous Insuffiency (use after diagnosis is confirmed) Knowledge deficit related to disease process and management Goals: Patient will maintain optimal edema control Date Initiated: 06/20/2020 Target Resolution Date: 08/28/2021 Goal Status: Active Interventions: Assess peripheral edema status every visit. Compression as ordered Treatment Activities: Therapeutic compression applied : 06/20/2020 Notes: 01/30/21: Edema control ongoing, using CoFlex wrap. 06/05/21: Edema control continues Wound/Skin Impairment Nursing Diagnoses: Impaired tissue integrity Knowledge deficit related to ulceration/compromised skin integrity Goals: Patient/caregiver will verbalize understanding of skin care regimen Date Initiated: 01/17/2020 Target Resolution Date: 08/28/2021 Goal Status: Active Ulcer/skin breakdown will have a volume reduction of 30% by week 4 Date Initiated: 01/17/2020 Date Inactivated: 03/12/2020 Target Resolution Date: 03/09/2020 Goal Status: Met Interventions: Assess patient/caregiver ability to  obtain necessary supplies Assess patient/caregiver ability to perform ulcer/skin care regimen upon admission and as needed Assess ulceration(s) every visit Provide education on ulcer and skin care Notes: 01/30/21: Wound care regimen continues, PuraPly skin sub being used. 04/17/21: Wound healed 06/05/21: Wound re-opened, wound care regimen continues. Electronic Signature(s) Signed: 08/07/2021 4:30:06 PM By: [name] Entered By: [name] on 08/07/2021 09:51:18 -------------------------------------------------------------------------------- Pain Assessment Details Patient Name: Date of Service: [name], [name] 08/07/2021 9:30 A M Medical Record Number: [id number] Patient Account Number: [id number] Date of Birth/Sex: Treating RN: [birth year] (66 y.o. [name] Primary Care Virgie Kunda: [name] Other Clinician: Referring Madilynne Mullan: Treating Lolitha Tortora/Extender: [name] in Treatment: 23 Active Problems Location of Pain Severity and Description of Pain Patient Has Paino No Site Locations Pain Management and Medication Current Pain Management: Electronic Signature(s) Signed: 08/07/2021 4:30:06 PM By: [name] Entered By: [name] on 08/07/2021 09:42:01 -------------------------------------------------------------------------------- Patient/Caregiver Education Details Patient Name: Date of Service: [name], [name] 3/8/2023andnbsp9:30 A M Medical Record Number: [id number] Patient Account Number: [id number] Date of Birth/Gender: Treating RN: 1956-04-17 (66 y.o. [name] Primary Care Physician: [name] Other Clinician: Referring Physician: Treating Physician/Extender: [name] in Treatment: 27 Education Assessment Education Provided To: Patient Education Topics Provided Wound/Skin Impairment: Methods: Explain/Verbal, Printed Responses: State content correctly Electronic  Signature(s) Signed: 08/07/2021 4:30:06 PM By: [name] Entered By: [name] on 08/07/2021 09:51:53 -------------------------------------------------------------------------------- Wound Assessment Details Patient Name: Date of Service: [name], [name] 08/07/2021 9:30 A M Medical Record Number: [id number] Patient Account Number: [id number] Date of Birth/Sex: Treating RN: 1955/12/17 (66 y.o. [name] Primary Care Lima Chillemi: [name] Other Clinician: Referring Statia Burdick: Treating Carrah Eppolito/Extender: [name] in Treatment: 105 Wound Status Wound Number: 15R Primary Venous Leg Ulcer Etiology: Wound Location: Left, Medial Malleolus Wound Status: Open Wounding Event: Gradually Appeared Comorbid Peripheral Venous Disease, Osteoarthritis, Confinement Date Acquired: 12/30/2019 History: Anxiety Weeks Of Treatment: 81 Clustered Wound: No Photos Wound Measurements Length: (cm) 0.2 Width: (cm) 0.2 Depth: (cm) 0.1 Area: (cm) 0.031 Volume: (cm) 0.003 % Reduction in Area: 99.7% % Reduction in Volume: 100% Epithelialization: Large (67-100%) Tunneling: No Undermining: No Wound Description Classification: Full Thickness With Exposed Support Structures Wound Margin: Distinct, outline attached Exudate Amount: Medium Exudate Type: Serosanguineous Exudate Color: red, brown Foul Odor After Cleansing: No Slough/Fibrino No Wound Bed Granulation Amount: Large (67-100%) Exposed Structure Granulation Quality: Pink, Pale Fascia Exposed: No Necrotic Amount: None Present (0%) Fat Layer (Subcutaneous Tissue) Exposed: Yes Tendon Exposed: No  Muscle Exposed: No Joint Exposed: No Bone Exposed: No Treatment Notes Wound #15R (Malleolus) Wound Laterality: Left, Medial Cleanser Wound Cleanser Discharge Instruction: Cleanse the wound with wound cleanser prior to applying a clean dressing using gauze sponges, not tissue or  cotton balls. Peri-Wound Care Sween Lotion (Moisturizing lotion) Discharge Instruction: Apply moisturizing lotion as directed Topical Primary Dressing Xeroform Occlusive Gauze Dressing, 4x4 in Discharge Instruction: Apply to wound bed as instructed Secondary Dressing Woven Gauze Sponge, Non-Sterile 4x4 in Discharge Instruction: Apply over primary dressing as directed. ABD Pad, 8x10 Discharge Instruction: Apply over primary dressing as directed. Secured With Compression Wrap CoFlex TLC XL 2-layer Compression System 4x7 (in/yd) Discharge Instruction: Apply CoFlex 2-layer compression as directed. (alt for 4 layer) Compression Stockings Add-Ons Electronic Signature(s) Signed: 08/07/2021 4:30:06 PM By: [name] Entered By: [name] on 08/07/2021 09:49:57 -------------------------------------------------------------------------------- Vitals Details Patient Name: Date of Service: [name], [name] 08/07/2021 9:30 A M Medical Record Number: [id number] Patient Account Number: [id number] Date of Birth/Sex: Treating RN: 1955/06/25 (66 y.o. [name] Primary Care Machel Violante: [name] Other Clinician: Referring Kaycen Whitworth: Treating Stella Encarnacion/Extender: [name] in Treatment: 75 Vital Signs Time Taken: 09:37 Temperature (F): 98.0 Height (in): 68 Pulse (bpm): 69 Respiratory Rate (breaths/min): 18 Blood Pressure (mmHg): 110/57 Reference Range: 80 - 120 mg / dl Electronic Signature(s) Signed: 08/07/2021 4:30:06 PM By: [name] Entered By: [name] on 08/07/2021 09:38:41

## 2021-08-12 ENCOUNTER — Telehealth: Payer: Self-pay

## 2021-08-12 NOTE — Telephone Encounter (Signed)
Casts sent to [employer] ?

## 2021-08-14 ENCOUNTER — Encounter (HOSPITAL_BASED_OUTPATIENT_CLINIC_OR_DEPARTMENT_OTHER): Payer: Medicare Other | Admitting: General Surgery

## 2021-08-14 ENCOUNTER — Other Ambulatory Visit: Payer: Self-pay

## 2021-08-14 DIAGNOSIS — I87332 Chronic venous hypertension (idiopathic) with ulcer and inflammation of left lower extremity: Secondary | ICD-10-CM | POA: Diagnosis not present

## 2021-08-14 NOTE — Progress Notes (Signed)
AKEELAH, SEPPALA ([id number]) ?Visit Report for 08/14/2021 ?Arrival Information Details ?Patient Name: Date of Service: ?[name], [name] 08/14/2021 9:15 A M ?Medical Record Number: [id number] ?Patient Account Number: [id number] ?Date of Birth/Sex: Treating RN: ?02/15/1956 (66 y.o. [name] ?Primary Care Abenezer Odonell: [name] Other Clinician: ?Referring Ceferino Lang: ?Treating Rashan Rounsaville/Extender: Fredirick Maudlin ?Redmon, Noelle ?Weeks in Treatment: 43 ?Visit Information History Since Last Visit ?Added or deleted any medications: No ?Patient Arrived: Ambulatory ?Any new allergies or adverse reactions: No ?Arrival Time: 09:43 ?Had a fall or experienced change in No ?Transfer Assistance: None ?activities of daily living that may affect ?Patient Identification Verified: Yes ?risk of falls: ?Secondary Verification Process Completed: Yes ?Signs or symptoms of abuse/neglect since last visito No ?Patient Requires Transmission-Based Precautions: No ?Hospitalized since last visit: No ?Patient Has Alerts: Yes ?Implantable device outside of the clinic excluding No ?Patient Alerts: L ABI =1.12, TBI = .92 ?cellular tissue based products placed in the center ?R ABI= 1.02, TBI= .58 ?since last visit: ?Has Dressing in Place as Prescribed: Yes ?Has Compression in Place as Prescribed: Yes ?Pain Present Now: No ?Electronic Signature(s) ?Signed: 08/14/2021 5:05:04 PM By: [name] ?Entered By: [name] on 08/14/2021 09:44:07 ?-------------------------------------------------------------------------------- ?Compression Therapy Details ?Patient Name: Date of Service: ?[name], [name] 08/14/2021 9:15 A M ?Medical Record Number: [id number] ?Patient Account Number: [id number] ?Date of Birth/Sex: Treating RN: ?1956-03-10 (66 y.o. [name] ?Primary Care Loui Massenburg: [name] Other Clinician: ?Referring Adriauna Campton: ?Treating Chamya Hunton/Extender: Fredirick Maudlin ?Redmon, Noelle ?Weeks in Treatment: 37 ?Compression  Therapy Performed for Wound Assessment: Wound #15R Left,Medial Malleolus ?Performed By: Clinician [name], RN ?Compression Type: Double Layer ?Post Procedure Diagnosis ?Same as Pre-procedure ?Electronic Signature(s) ?Signed: 08/14/2021 5:05:04 PM By: [name] ?Entered By: [name] on 08/14/2021 10:27:06 ?-------------------------------------------------------------------------------- ?Encounter Discharge Information Details ?Patient Name: ?Date of Service: ?[name], [name] 08/14/2021 9:15 A M ?Medical Record Number: [id number] ?Patient Account Number: [id number] ?Date of Birth/Sex: ?Treating RN: ?1955-09-10 (66 y.o. [name] ?Primary Care Jemia Fata: [name] ?Other Clinician: ?Referring Adisson Deak: ?Treating Maygen Sirico/Extender: Fredirick Maudlin ?Redmon, Noelle ?Weeks in Treatment: 15 ?Encounter Discharge Information Items Post Procedure Vitals ?Discharge Condition: Stable ?Temperature (F): 98.1 ?Ambulatory Status: [name] ?Pulse (bpm): 64 ?Discharge Destination: Home ?Respiratory Rate (breaths/min): 18 ?Transportation: Private Auto ?Blood Pressure (mmHg): 121/73 ?Schedule Follow-up Appointment: Yes ?Clinical Summary of Care: Provided on 08/14/2021 ?Form Type Recipient ?Paper Patient Patient ?Electronic Signature(s) ?Signed: 08/14/2021 11:27:11 AM By: [name] ?Entered By: [name] on 08/14/2021 11:27:10 ?-------------------------------------------------------------------------------- ?Lower Extremity Assessment Details ?Patient Name: ?Date of Service: ?[name], [name] 08/14/2021 9:15 A M ?Medical Record Number: [id number] ?Patient Account Number: [id number] ?Date of Birth/Sex: ?Treating RN: ?[birth year] (66 y.o. [name] ?Primary Care Kristian Mogg: [name] ?Other Clinician: ?Referring Kao Conry: ?Treating Dyesha Henault/Extender: Fredirick Maudlin ?Redmon, Noelle ?Weeks in Treatment: 74 ?Edema Assessment ?Assessed: [Left: Yes] [Right: No] ?Edema: [Left: N] [Right:  o] ?Calf ?Left: Right: ?Point of Measurement: 31 cm From Medial Instep 26.9 cm ?Ankle ?Left: Right: ?Point of Measurement: 11 cm From Medial Instep 19.7 cm ?Vascular Assessment ?Pulses: ?Dorsalis Pedis ?Palpable: [Left:Yes] ?Electronic Signature(s) ?Signed: 08/14/2021 5:01:39 PM By: Sharyn Creamer RN, BSN ?Signed: 08/14/2021 5:05:04 PM By: [name] ?Entered By: Sharyn Creamer on 08/14/2021 09:46:59 ?-------------------------------------------------------------------------------- ?Multi Wound Chart Details ?Patient Name: ?Date of Service: ?[name], [name] 08/14/2021 9:15 A M ?Medical Record Number: [id number] ?Patient Account Number: [id number] ?Date of Birth/Sex: ?Treating RN: ?1955-12-29 (66 y.o. F) ?Primary Care  Lucianne Smestad: [name] ?Other Clinician: ?Referring Asheton Viramontes: ?Treating Jewelz Ricklefs/Extender: Fredirick Maudlin ?Redmon, Noelle ?Weeks in Treatment: 11 ?Vital Signs ?Height(in): 68 ?Pulse(bpm): 64 ?Weight(lbs): ?Blood Pressure(mmHg): 121/73 ?Body Mass Index(BMI): ?Temperature(??F): 98.1 ?Respiratory Rate(breaths/min): 18 ?Photos: [N/A:N/A] ?Left, Medial Malleolus N/A N/A ?Wound Location: ?Gradually Appeared N/A N/A ?Wounding Event: ?Venous Leg Ulcer N/A N/A ?Primary Etiology: ?Peripheral Venous Disease, N/A N/A ?Comorbid History: ?Osteoarthritis, Confinement Anxiety ?12/30/2019 N/A N/A ?Date Acquired: ?26 N/A N/A ?Weeks of Treatment: ?Open N/A N/A ?Wound Status: ?Yes N/A N/A ?Wound Recurrence: ?0.2x0.2x0.1 N/A N/A ?Measurements L x W x D (cm) ?0.031 N/A N/A ?A (cm?) : ?rea ?0.003 N/A N/A ?Volume (cm?) : ?99.70% N/A N/A ?% Reduction in A rea: ?100.00% N/A N/A ?% Reduction in Volume: ?Full Thickness With Exposed Support N/A N/A ?Classification: ?Structures ?Medium N/A N/A ?Exudate A mount: ?Serosanguineous N/A N/A ?Exudate Type: ?red, brown N/A N/A ?Exudate Color: ?Distinct, outline attached N/A N/A ?Wound Margin: ?Large (67-100%) N/A N/A ?Granulation A mount: ?Pink, Pale N/A N/A ?Granulation  Quality: ?Small (1-33%) N/A N/A ?Necrotic A mount: ?Fat Layer (Subcutaneous Tissue): Yes N/A N/A ?Exposed Structures: ?Fascia: No ?Tendon: No ?Muscle: No ?Joint: No ?Bone: No ?Large (67-100%) N/A N/A ?Epithelialization: ?Debridement - Selective/Open Wound N/A N/A ?Debridement: ?Pre-procedure Verification/Time Out 10:21 N/A N/A ?Taken: ?Lidocaine 5% topical ointment N/A N/A ?Pain Control: ?Skin/Dermis N/A N/A ?Level: ?0.04 N/A N/A ?Debridement A (sq cm): ?rea ?Curette N/A N/A ?Instrument: ?Minimum N/A N/A ?Bleeding: ?Pressure N/A N/A ?Hemostasis A chieved: ?Procedure was tolerated well N/A N/A ?Debridement Treatment Response: ?0.2x0.2x0.1 N/A N/A ?Post Debridement Measurements L x ?W x D (cm) ?0.003 N/A N/A ?Post Debridement Volume: (cm?) ?Compression Therapy N/A N/A ?Procedures Performed: ?Debridement ?Treatment Notes ?Wound #15R (Malleolus) Wound Laterality: Left, Medial ?Cleanser ?Wound Cleanser ?Discharge Instruction: Cleanse the wound with wound cleanser prior to applying a clean dressing using gauze sponges, not tissue or cotton balls. ?Peri-Wound Care ?Sween Lotion (Moisturizing lotion) ?Discharge Instruction: Apply moisturizing lotion as directed ?Topical ?Primary Dressing ?Xeroform Occlusive Gauze Dressing, 4x4 in ?Discharge Instruction: Apply to wound bed as instructed ?Secondary Dressing ?Woven Gauze Sponge, Non-Sterile 4x4 in ?Discharge Instruction: Apply over primary dressing as directed. ?ABD Pad, 8x10 ?Discharge Instruction: Apply over primary dressing as directed. ?Secured With ?Compression Wrap ?CoFlex TLC XL 2-layer Compression System 4x7 (in/yd) ?Discharge Instruction: Apply CoFlex 2-layer compression as directed. (alt for 4 layer) ?Compression Stockings ?Add-Ons ?Electronic Signature(s) ?Signed: 08/14/2021 12:30:00 PM By: Fredirick Maudlin MD FACS ?Previous Signature: 08/14/2021 10:26:54 AM Version By: Fredirick Maudlin MD FACS ?Entered By: Fredirick Maudlin on 08/14/2021  12:30:00 ?-------------------------------------------------------------------------------- ?Multi-Disciplinary Care Plan Details ?Patient Name: ?Date of Service: ?[name], [name] 08/14/2021 9:15 A M ?Medical Record Number: [id number] ?Patient Account Number: [id number]

## 2021-08-14 NOTE — Progress Notes (Addendum)
LEYNA, VANDERKOLK ([id number]) ?Visit Report for 08/14/2021 ?Chief Complaint Document Details ?Patient Name: Date of Service: ?[name], [name] 08/14/2021 9:15 A M ?Medical Record Number: [id number] ?Patient Account Number: [id number] ?Date of Birth/Sex: Treating RN: ?May 26, 1956 (66 y.o. F) ?Primary Care Provider: [name] Other Clinician: ?Referring Provider: ?Treating Provider/Extender: Fredirick Maudlin ?Redmon, Noelle ?Weeks in Treatment: 70 ?Information Obtained from: Patient ?Chief Complaint ?patient is been followed long-term in this clinic for venous insufficiency ulcers with inflammation, hypertension and ulceration over the medial ankle bilaterally. ?01/17/2020; this is a patient who is here for review of postoperative wounds on the left lateral ankle and recurrence of venous stasis ulceration on the left ?medial ?Electronic Signature(s) ?Signed: 08/14/2021 10:27:10 AM By: Fredirick Maudlin MD FACS ?Entered By: Fredirick Maudlin on 08/14/2021 10:27:10 ?-------------------------------------------------------------------------------- ?Debridement Details ?Patient Name: Date of Service: ?[name], [name] 08/14/2021 9:15 A M ?Medical Record Number: [id number] ?Patient Account Number: [id number] ?Date of Birth/Sex: Treating RN: ?July 16, 1955 (66 y.o. [name] ?Primary Care Provider: [name] Other Clinician: ?Referring Provider: ?Treating Provider/Extender: Fredirick Maudlin ?Redmon, Noelle ?Weeks in Treatment: 26 ?Debridement Performed for Assessment: Wound #15R Left,Medial Malleolus ?Performed By: Physician Fredirick Maudlin, MD ?Debridement Type: Debridement ?Severity of Tissue Pre Debridement: Fat layer exposed ?Level of Consciousness (Pre-procedure): Awake and Alert ?Pre-procedure Verification/Time Out Yes - 10:21 ?Taken: ?Start Time: 10:22 ?Pain Control: Lidocaine 5% topical ointment ?T Area Debrided (L x W): ?otal 0.2 (cm) x 0.2 (cm) = 0.04 (cm?) ?Tissue and other material debrided: ?Non-Viable,  Skin: Dermis ?Level: Skin/Dermis ?Debridement Description: Selective/Open Wound ?Instrument: Curette ?Bleeding: Minimum ?Hemostasis Achieved: Pressure ?End Time: 10:26 ?Response to Treatment: Procedure was tolerated well ?Level of Consciousness (Post- Awake and Alert ?procedure): ?Post Debridement Measurements of Total Wound ?Length: (cm) 0.2 ?Width: (cm) 0.2 ?Depth: (cm) 0.1 ?Volume: (cm?) 0.003 ?Character of Wound/Ulcer Post Debridement: Stable ?Severity of Tissue Post Debridement: Fat layer exposed ?Post Procedure Diagnosis ?Same as Pre-procedure ?Electronic Signature(s) ?Signed: 08/14/2021 5:05:04 PM By: [name] ?Signed: 08/14/2021 6:42:44 PM By: Fredirick Maudlin MD FACS ?Entered By: [name] on 08/14/2021 10:26:45 ?-------------------------------------------------------------------------------- ?HPI Details ?Patient Name: Date of Service: ?[name], [name] 08/14/2021 9:15 A M ?Medical Record Number: [id number] ?Patient Account Number: [id number] ?Date of Birth/Sex: Treating RN: ?[birth year] (66 y.o. F) ?Primary Care Provider: [name] Other Clinician: ?Referring Provider: ?Treating Provider/Extender: Fredirick Maudlin ?Redmon, Noelle ?Weeks in Treatment: 3 ?History of Present Illness ?HPI Description: the remaining wound is over the left medial ankle. Similar wound over the right medial ankle healed largely with use of Apligraf. Most recently ?we have been using Hydrofera Blue over this wound with considerable improvement. The patient has been extensively worked up in the past for her venous ?insufficiency and she is not a candidate for antireflux surgery although I have none of the details available currently. ?08/24/14; considerable improvement today. About 50% of this wound areas now epithelialized. The base of the wound appears to be healthier granulation.as ?opposed to last week when she had deteriorated a considerable improvement ?08/17/14; unfortunately the wound has regressed somewhat. The  areas of epithelialization from the superior aspect are not nearly as healthy as they were last ?week. The patient thinks her Hydrofera Blue slipped. ?09/07/14; unfortunately the area has markedly regressed in the 2 weeks since I've seen this. There is an odor surrounding erythema. The healthy granulation ?tissue that we had at the base of the wound now is a dusky color. The nurse reports green drainage ?09/14/14; the area  looks somewhat better than last week. There is less erythema and less drainage. The culture I did did not show any growth. Nevertheless I ?think it is better to continue the Cipro and doxycycline for a further week. The remaining wound area was debridement. ?09/21/14. Wound did not require debridement last week. Still less erythema and less drainage. She can complete her antibiotics. The areas of epithelialization in ?the superior aspect of the wound do not look as healthy as they did some weeks ago ?10/05/14 continued improvement in the condition of this wound. There is advancing epithelialization. Less aggressive debridement required ?10/19/14 continued improvement in the condition and volume of this wound. Less aggressive debridement to the inferior part of this to remove surface slough ?and fibrinous eschar ?11/02/14 no debridement is required. The surface granulation appears healthy although some of her islands of epithelialization seem to have regressed. No ?evidence of infection ?11/16/14; lites surface debridement done of surface eschar. The wound does not look to be unhealthy. No evidence of infection. ?Unfortunately the patient has had podiatry issues in the right foot and for some reason has redeveloped small surface ulcerations in the medial right ankle. Her ?original presentation involved wounds in this area ?11/23/14 no debridement. The area on the right ankle has enlarged. The left ankle wound appears stable in terms of the surface although there is periwound ?inflammation. There has been  regression in the amount of new skin ?11/30/14 no debridement. Both wound areas appear healthy. There was no evidence of infection. The the new area on the right medial ankle has enlarged ?although that both the surfaces appear to be stable. ?12/07/14; Debridement of the right medial ankle wound. No no debridement was done on the left. ?12/14/14 no major change in and now bilateral medial ankle wounds. Both of these are very painful but the no overt evidence of infection. She has had previous ?venous ablation ?12/21/14; patient states that her right medial ankle wound is considerably more painful last week than usual. Her left is also somewhat painful. She could not ?tolerate debridement. The right medial ankle wound has fibrinous surface eschar ?12/28/14 this is a patient with severe bilateral venous insufficiency ulcers. For a considerable period of time we actually had the one on the right medial ankle ?healed however this recently opened up again in June. The left medial ankle wound has been a refractory area with some absent flows. We had some success ?with Hydrofera Blue on this area and it literally closed by 50% however it is recently opened up Foley. Both of these were debridement today of surface ?eschar. She tolerates this poorly ?01/25/15: No change in the status of this. Thick adherent escar. Very poor tolerance of any attempt at debridement. I had healed the right medial malleolus ?wound for a considerable amount of time and had the left one down to about 50% of the volume although this is totally regressed over the last 48 weeks. ?Further the right leg has reopened. she is trying to make a appointment with pain and vascular, previous ablations with Dr. Aleda Grana. I do not believe there ?is an arterial insufficiency issue here ?02/01/15 the status of the adherent eschar bilaterally is actually improved. No debridement was done. She did not manage to get vascular studies done ?02/08/15 continued debridement of  the area was done today. The slough is less adherent and comes off with less pressure. There is no surrounding infection ?peripheral pulses are intact ?02/15/15 selective debridement with a disposable cure

## 2021-08-20 ENCOUNTER — Telehealth: Payer: Self-pay | Admitting: Podiatry

## 2021-08-20 NOTE — Telephone Encounter (Signed)
Called patient, unable to lvm to schedule an appointment for pick up ?

## 2021-08-21 ENCOUNTER — Other Ambulatory Visit: Payer: Self-pay

## 2021-08-21 ENCOUNTER — Encounter (HOSPITAL_BASED_OUTPATIENT_CLINIC_OR_DEPARTMENT_OTHER): Payer: Medicare Other | Admitting: Physician Assistant

## 2021-08-21 DIAGNOSIS — I87332 Chronic venous hypertension (idiopathic) with ulcer and inflammation of left lower extremity: Secondary | ICD-10-CM | POA: Diagnosis not present

## 2021-08-21 NOTE — Progress Notes (Addendum)
HA, SHANNAHAN ([id number]) ?Visit Report for 08/21/2021 ?Chief Complaint Document Details ?Patient Name: Date of Service: ?[name], [name]. 08/21/2021 8:00 A M ?Medical Record Number: [id number] ?Patient Account Number: [id number] ?Date of Birth/Sex: Treating RN: ?1956/01/29 (66 y.o. F) ?Primary Care Provider: [name] Other Clinician: ?Referring Provider: ?Treating Provider/Extender: [name] ?Redmon, Noelle ?Weeks in Treatment: 92 ?Information Obtained from: Patient ?Chief Complaint ?patient is been followed long-term in this clinic for venous insufficiency ulcers with inflammation, hypertension and ulceration over the medial ankle bilaterally. ?01/17/2020; this is a patient who is here for review of postoperative wounds on the left lateral ankle and recurrence of venous stasis ulceration on the left ?medial ?Electronic Signature(s) ?Signed: 08/21/2021 8:20:06 AM By: [name] PA-C ?Entered By: [name] on 08/21/2021 08:20:06 ?-------------------------------------------------------------------------------- ?HPI Details ?Patient Name: Date of Service: ?[name], [name]. 08/21/2021 8:00 A M ?Medical Record Number: [id number] ?Patient Account Number: [id number] ?Date of Birth/Sex: Treating RN: ?10-10-1955 (66 y.o. F) ?Primary Care Provider: [name] Other Clinician: ?Referring Provider: ?Treating Provider/Extender: [name] ?Redmon, Noelle ?Weeks in Treatment: 52 ?History of Present Illness ?HPI Description: the remaining wound is over the left medial ankle. Similar wound over the right medial ankle healed largely with use of Apligraf. Most recently ?we have been using Hydrofera Blue over this wound with considerable improvement. The patient has been extensively worked up in the past for her venous ?insufficiency and she is not a candidate for antireflux surgery although I have none of the details available currently. ?08/24/14; considerable improvement today. About 50% of this  wound areas now epithelialized. The base of the wound appears to be healthier granulation.as ?opposed to last week when she had deteriorated a considerable improvement ?08/17/14; unfortunately the wound has regressed somewhat. The areas of epithelialization from the superior aspect are not nearly as healthy as they were last ?week. The patient thinks her Hydrofera Blue slipped. ?09/07/14; unfortunately the area has markedly regressed in the 2 weeks since I've seen this. There is an odor surrounding erythema. The healthy granulation ?tissue that we had at the base of the wound now is a dusky color. The nurse reports green drainage ?09/14/14; the area looks somewhat better than last week. There is less erythema and less drainage. The culture I did did not show any growth. Nevertheless I ?think it is better to continue the Cipro and doxycycline for a further week. The remaining wound area was debridement. ?09/21/14. Wound did not require debridement last week. Still less erythema and less drainage. She can complete her antibiotics. The areas of epithelialization in ?the superior aspect of the wound do not look as healthy as they did some weeks ago ?10/05/14 continued improvement in the condition of this wound. There is advancing epithelialization. Less aggressive debridement required ?10/19/14 continued improvement in the condition and volume of this wound. Less aggressive debridement to the inferior part of this to remove surface slough ?and fibrinous eschar ?11/02/14 no debridement is required. The surface granulation appears healthy although some of her islands of epithelialization seem to have regressed. No ?evidence of infection ?11/16/14; lites surface debridement done of surface eschar. The wound does not look to be unhealthy. No evidence of infection. ?Unfortunately the patient has had podiatry issues in the right foot and for some reason has redeveloped small surface ulcerations in the medial right ankle. Her ?original  presentation involved wounds in this area ?11/23/14 no debridement. The area on the right ankle has enlarged.  The left ankle wound appears stable in terms of the surface although there is periwound ?inflammation. There has been regression in the amount of new skin ?11/30/14 no debridement. Both wound areas appear healthy. There was no evidence of infection. The the new area on the right medial ankle has enlarged ?although that both the surfaces appear to be stable. ?12/07/14; Debridement of the right medial ankle wound. No no debridement was done on the left. ?12/14/14 no major change in and now bilateral medial ankle wounds. Both of these are very painful but the no overt evidence of infection. She has had previous ?venous ablation ?12/21/14; patient states that her right medial ankle wound is considerably more painful last week than usual. Her left is also somewhat painful. She could not ?tolerate debridement. The right medial ankle wound has fibrinous surface eschar ?12/28/14 this is a patient with severe bilateral venous insufficiency ulcers. For a considerable period of time we actually had the one on the right medial ankle ?healed however this recently opened up again in June. The left medial ankle wound has been a refractory area with some absent flows. We had some success ?with Hydrofera Blue on this area and it literally closed by 50% however it is recently opened up Foley. Both of these were debridement today of surface ?eschar. She tolerates this poorly ?01/25/15: No change in the status of this. Thick adherent escar. Very poor tolerance of any attempt at debridement. I had healed the right medial malleolus ?wound for a considerable amount of time and had the left one down to about 50% of the volume although this is totally regressed over the last 48 weeks. ?Further the right leg has reopened. she is trying to make a appointment with pain and vascular, previous ablations with Dr. Aleda Grana. I do not believe  there ?is an arterial insufficiency issue here ?02/01/15 the status of the adherent eschar bilaterally is actually improved. No debridement was done. She did not manage to get vascular studies done ?02/08/15 continued debridement of the area was done today. The slough is less adherent and comes off with less pressure. There is no surrounding infection ?peripheral pulses are intact ?02/15/15 selective debridement with a disposable curette. Again the slough is less adherent and comes off with less difficulty. No surrounding infection ?peripheral pulses are intact. ?02/22/15 selective debridement of the right medial ankle wound. Slough comes off with less difficulty. No obvious surrounding infection peripheral pulses are ?intact I did not debridement the one on the left. Both of these are stable to improved ?03/01/15 selective debridement of both wound areas using a curette to. Adherent slough cup soft with less difficulty. No obvious surrounding infection. The ?patient tells me that 2 days ago she noted a rash above the right leg wrap. She did not have this on her lower legs when she change this over she arrives with ?widespread left greater than right almost folliculitis-looking rash which is extremely pruritic. I don't see anything to culture here. There is no rash on the rest of ?her body. She feels well systemically. ?03/08/15; selective debridement of both wounds using a curette. Base of this does not look unhealthy. She had limegreen drainage coming out of the left leg ?wound and describes a lot of drainage. The rash on her left leg looks improved to. No cultures were done. ?03/22/15; patient was not here last week. Basal wounds does not look healthy and there is no surrounding erythema. No drainage. There is still a rash on the ?left  leg that almost looks vasculitic however it is clearly limited to the top of where the wrap would be. ?04/05/15; on the right required a surgical debridement of surface eschar and necrotic  subcutaneous tissue. I did not debridement the area on the left. These ?continue to be large open wounds that are not changing that much. We were successful at one point in healing the area on the right, and

## 2021-08-21 NOTE — Progress Notes (Signed)
[name] [name], [name] [name] ([id number]) ?Visit Report for 08/21/2021 ?Arrival Information Details ?Patient Name: Date of Service: ?[name] [name], [name] [name] [name]. 08/21/2021 8:00 A M ?Medical Record Number: [id number] ?Patient Account Number: [id number] ?Date of Birth/Sex: Treating RN: ?1955/12/29 (66 y.o. [name] ?Primary Care Gittel Mccamish: [name] Other Clinician: ?Referring Whittaker Lenis: ?Treating Loella Hickle/Extender: [name] ?Redmon, Noelle ?Weeks in Treatment: 77 ?Visit Information History Since Last Visit ?Added or deleted any medications: [name] ?Patient Arrived: [name] ?Any new allergies or adverse reactions: [name] ?Arrival Time: 08:21 ?Had a fall or experienced change in [name] ?Transfer Assistance: None ?activities of daily living that may affect ?Patient Identification Verified: Yes ?risk of falls: ?Secondary Verification Process Completed: Yes ?Signs or symptoms of abuse/neglect since last visito [name] ?Patient Requires Transmission-Based Precautions: [name] ?Hospitalized since last visit: [name] ?Patient Has Alerts: Yes ?Implantable device outside of the clinic excluding [name] ?Patient Alerts: L ABI =1.12, TBI = .92 ?cellular tissue based products placed in the center ?R ABI= 1.02, TBI= .58 ?since last visit: ?Has Dressing in Place as Prescribed: Yes ?Has Compression in Place as Prescribed: Yes ?Pain Present Now: [name] ?Electronic Signature(s) ?Signed: 08/21/2021 5:15:11 PM By: [name] ?Entered By: [name] on 08/21/2021 08:21:59 ?-------------------------------------------------------------------------------- ?Compression Therapy Details ?Patient Name: Date of Service: ?[name] [name], [name] [name] [name]. 08/21/2021 8:00 A M ?Medical Record Number: [id number] ?Patient Account Number: [id number] ?Date of Birth/Sex: Treating RN: ?1955/09/23 (66 y.o. [name] ?Primary Care Vlasta Baskin: [name] Other Clinician: ?Referring Isatu Macinnes: ?Treating Rayleen Wyrick/Extender: [name] ?Redmon, Noelle ?Weeks in Treatment: 67 ?Compression Therapy  Performed for Wound Assessment: Wound #15R Left,Medial Malleolus ?Performed By: Clinician [name], RN ?Compression Type: Double Layer ?Post Procedure Diagnosis ?Same as Pre-procedure ?Electronic Signature(s) ?Signed: 08/21/2021 5:15:11 PM By: [name] ?Entered By: [name] on 08/21/2021 08:47:13 ?-------------------------------------------------------------------------------- ?Encounter Discharge Information Details ?Patient Name: ?Date of Service: ?[name] [name], [name] [name] [name]. 08/21/2021 8:00 A M ?Medical Record Number: [id number] ?Patient Account Number: [id number] ?Date of Birth/Sex: ?Treating RN: ?[birth year] (66 y.o. [name] ?Primary Care Nickole Adamek: [name] ?Other Clinician: ?Referring Mayur Duman: ?Treating Cande Mastropietro/Extender: [name] ?Redmon, Noelle ?Weeks in Treatment: 61 ?Encounter Discharge Information Items ?Discharge Condition: Stable ?Ambulatory Status: [name] ?Discharge Destination: Home ?Transportation: Private Auto ?Schedule Follow-up Appointment: Yes ?Clinical Summary of Care: Provided on 08/21/2021 ?Form Type Recipient ?Paper Patient Patient ?Electronic Signature(s) ?Signed: 08/21/2021 5:15:11 PM By: [name] ?Entered By: [name] on 08/21/2021 08:49:53 ?-------------------------------------------------------------------------------- ?Lower Extremity Assessment Details ?Patient Name: ?Date of Service: ?[name] [name], [name] [name] [name]. 08/21/2021 8:00 A M ?Medical Record Number: [id number] ?Patient Account Number: [id number] ?Date of Birth/Sex: ?Treating RN: ?08/09/1955 (66 y.o. [name] ?Primary Care Taden Witter: [name] ?Other Clinician: ?Referring Yolando Gillum: ?Treating Taheem Fricke/Extender: [name] ?Redmon, Noelle ?Weeks in Treatment: 21 ?Edema Assessment ?Assessed: [Left: Yes] [Right: [name]] ?Edema: [Left: N] [Right: o] ?Calf ?Left: Right: ?Point of Measurement: 31 cm From Medial Instep 27 cm ?Ankle ?Left: Right: ?Point of Measurement: 11 cm From Medial Instep 21  cm ?Vascular Assessment ?Pulses: ?Dorsalis Pedis ?Palpable: [Left:Yes] ?Electronic Signature(s) ?Signed: 08/21/2021 5:15:11 PM By: [name] ?Entered By: [name] on 08/21/2021 08:26:41 ?-------------------------------------------------------------------------------- ?Multi-Disciplinary Care Plan Details ?Patient Name: ?Date of Service: ?[name] [name], [name] [name] [name]. 08/21/2021 8:00 A M ?Medical Record Number: [id number] ?Patient Account Number: [id number] ?Date of Birth/Sex: ?Treating RN: ?September 05, 1955 (66 y.o. [name] ?Primary Care Aviona Martenson: [name] ?Other Clinician: ?Referring Inara Dike: ?Treating Jaidan Prevette/Extender: [name] ?Redmon, Noelle ?Weeks in Treatment: 81 ?Multidisciplinary Care  Plan reviewed with physician ?Active Inactive ?Venous Leg Ulcer ?Nursing Diagnoses: ?Actual venous Insuffiency (use after diagnosis is confirmed) ?Knowledge deficit related to disease process and management ?Goals: ?Patient will maintain optimal edema control ?Date Initiated: 06/20/2020 ?Target Resolution Date: 08/28/2021 ?Goal Status: Active ?Interventions: ?Assess peripheral edema status every visit. ?Compression as ordered ?Treatment Activities: ?Therapeutic compression applied : 06/20/2020 ?Notes: ?01/30/21: Edema control ongoing, using CoFlex wrap. 06/05/21: Edema control continues ?Wound/Skin Impairment ?Nursing Diagnoses: ?Impaired tissue integrity ?Knowledge deficit related to ulceration/compromised skin integrity ?Goals: ?Patient/caregiver will verbalize understanding of skin care regimen ?Date Initiated: 01/17/2020 ?Target Resolution Date: 08/28/2021 ?Goal Status: Active ?Ulcer/skin breakdown will have a volume reduction of 30% by week 4 ?Date Initiated: 01/17/2020 ?Date Inactivated: 03/12/2020 ?Target Resolution Date: 03/09/2020 ?Goal Status: Met ?Interventions: ?Assess patient/caregiver ability to obtain necessary supplies ?Assess patient/caregiver ability to perform ulcer/skin care regimen upon admission and  as needed ?Assess ulceration(s) every visit ?Provide education on ulcer and skin care ?Notes: ?01/30/21: Wound care regimen continues, PuraPly skin sub being used. 04/17/21: Wound healed 06/05/21: Wound re-opened, wound care regimen continues. ?Electronic Signature(s) ?Signed: 08/21/2021 5:15:11 PM By: [name] ?Entered By: [name] on 08/21/2021 08:19:39 ?-------------------------------------------------------------------------------- ?Pain Assessment Details ?Patient Name: ?Date of Service: ?[name] [name], [name] [name] [name]. 08/21/2021 8:00 A M ?Medical Record Number: [id number] ?Patient Account Number: [id number] ?Date of Birth/Sex: ?Treating RN: ?January 20, 1956 (66 y.o. [name] ?Primary Care Shawnda Mauney: [name] ?Other Clinician: ?Referring Rishi Vicario: ?Treating Adrieana Fennelly/Extender: [name] ?Redmon, Noelle ?Weeks in Treatment: 69 ?Active Problems ?Location of Pain Severity and Description of Pain ?Patient Has Paino [name] ?Site Locations ?Pain Management and Medication ?Current Pain Management: ?Electronic Signature(s) ?Signed: 08/21/2021 5:15:11 PM By: [name] ?Entered By: [name] on 08/21/2021 08:23:09 ?-------------------------------------------------------------------------------- ?Patient/Caregiver Education Details ?Patient Name: ?Date of Service: ?[name] [name], [name] [name] [name]. 3/22/2023andnbsp8:00 A M ?Medical Record Number: [id number] ?Patient Account Number: [id number] ?Date of Birth/Gender: ?Treating RN: ?08/29/1955 (66 y.o. [name] ?Primary Care Physician: [name] ?Other Clinician: ?Referring Physician: ?Treating Physician/Extender: [name] ?Redmon, Noelle ?Weeks in Treatment: 19 ?Education Assessment ?Education Provided To: ?Patient ?Education Topics Provided ?Venous: ?Methods: Explain/Verbal, Printed ?Responses: State content correctly ?Wound/Skin Impairment: ?Methods: Explain/Verbal, Printed ?Responses: State content correctly ?Electronic Signature(s) ?Signed: 08/21/2021  5:15:11 PM By: [name] ?Entered By: [name] on 08/21/2021 08:20:07 ?-------------------------------------------------------------------------------- ?Wound Assessment Details ?Patient Name: ?Date of S

## 2021-08-28 ENCOUNTER — Encounter (HOSPITAL_BASED_OUTPATIENT_CLINIC_OR_DEPARTMENT_OTHER): Payer: Medicare Other | Admitting: Physician Assistant

## 2021-08-28 ENCOUNTER — Other Ambulatory Visit: Payer: Self-pay

## 2021-08-28 DIAGNOSIS — I87332 Chronic venous hypertension (idiopathic) with ulcer and inflammation of left lower extremity: Secondary | ICD-10-CM | POA: Diagnosis not present

## 2021-08-28 NOTE — Progress Notes (Addendum)
AVEREE, HARB ([id number]) ?Visit Report for 08/28/2021 ?Chief Complaint Document Details ?Patient Name: Date of Service: ?[name], [name] 08/28/2021 9:30 A M ?Medical Record Number: [id number] ?Patient Account Number: [id number] ?Date of Birth/Sex: Treating RN: ?1955-09-25 (66 y.o. [name] ?Primary Care Provider: Lennie [name] Other Clinician: ?Referring Provider: ?Treating Provider/Extender: [name] ?[name], [name] ?Weeks in Treatment: 64 ?Information Obtained from: Patient ?Chief Complaint ?patient is been followed long-term in this clinic for venous insufficiency ulcers with inflammation, hypertension and ulceration over the medial ankle bilaterally. ?01/17/2020; this is a patient who is here for review of postoperative wounds on the left lateral ankle and recurrence of venous stasis ulceration on the left ?medial ?Electronic Signature(s) ?Signed: 08/28/2021 9:38:01 AM By: [name] Keeler PA-C ?Entered By: [name] on 08/28/2021 09:38:00 ?-------------------------------------------------------------------------------- ?HPI Details ?Patient Name: Date of Service: ?[name], [name] 08/28/2021 9:30 A M ?Medical Record Number: [id number] ?Patient Account Number: [id number] ?Date of Birth/Sex: Treating RN: ?07-Mar-1956 (66 y.o. [name] ?Primary Care Provider: Lennie [name] Other Clinician: ?Referring Provider: ?Treating Provider/Extender: [name] ?[name], [name] ?Weeks in Treatment: 65 ?History of Present Illness ?HPI Description: the remaining wound is over the left medial ankle. Similar wound over the right medial ankle healed largely with use of Apligraf. Most recently ?we have been using Hydrofera Blue over this wound with considerable improvement. The patient has been extensively worked up in the past for her venous ?insufficiency and she is not a candidate for antireflux surgery although I have none of the details available currently. ?08/24/14; considerable improvement  today. About 50% of this wound areas now epithelialized. The base of the wound appears to be healthier granulation.as ?opposed to last week when she had deteriorated a considerable improvement ?08/17/14; unfortunately the wound has regressed somewhat. The areas of epithelialization from the superior aspect are not nearly as healthy as they were last ?week. The patient thinks her Hydrofera Blue slipped. ?09/07/14; unfortunately the area has markedly regressed in the 2 weeks since I've seen this. There is an [name] surrounding erythema. The healthy granulation ?tissue that we had at the base of the wound now is a dusky color. The nurse reports green drainage ?09/14/14; the area looks somewhat better than last week. There is less erythema and less drainage. The culture I did did not show any growth. Nevertheless I ?think it is better to continue the Cipro and doxycycline for a further week. The remaining wound area was debridement. ?09/21/14. Wound did not require debridement last week. Still less erythema and less drainage. She can complete her antibiotics. The areas of epithelialization in ?the superior aspect of the wound do not look as healthy as they did some weeks ago ?10/05/14 continued improvement in the condition of this wound. There is advancing epithelialization. Less aggressive debridement required ?10/19/14 continued improvement in the condition and volume of this wound. Less aggressive debridement to the inferior part of this to remove surface slough ?and fibrinous eschar ?11/02/14 no debridement is required. The surface granulation appears healthy although some of her islands of epithelialization seem to have regressed. No ?evidence of infection ?11/16/14; lites surface debridement done of surface eschar. The wound does not look to be unhealthy. No evidence of infection. ?Unfortunately the patient has had podiatry issues in the right foot and for some reason has redeveloped small surface ulcerations in the medial  right ankle. Her ?original presentation involved wounds in this area ?11/23/14 no debridement. The area on the  right ankle has enlarged. The left ankle wound appears stable in terms of the surface although there is periwound ?inflammation. There has been regression in the amount of new skin ?11/30/14 no debridement. Both wound areas appear healthy. There was no evidence of infection. The the new area on the right medial ankle has enlarged ?although that both the surfaces appear to be stable. ?12/07/14; Debridement of the right medial ankle wound. No no debridement was done on the left. ?12/14/14 no major change in and now bilateral medial ankle wounds. Both of these are very painful but the no overt evidence of infection. She has had previous ?venous ablation ?12/21/14; patient states that her right medial ankle wound is considerably more painful last week than usual. Her left is also somewhat painful. She could not ?tolerate debridement. The right medial ankle wound has fibrinous surface eschar ?12/28/14 this is a patient with severe bilateral venous insufficiency ulcers. For a considerable period of time we actually had the one on the right medial ankle ?healed however this recently opened up again in June. The left medial ankle wound has been a refractory area with some absent flows. We had some success ?with Hydrofera Blue on this area and it literally closed by 50% however it is recently opened up Foley. Both of these were debridement today of surface ?eschar. She tolerates this poorly ?01/25/15: No change in the status of this. Thick adherent escar. Very poor tolerance of any attempt at debridement. I had healed the right medial malleolus ?wound for a considerable amount of time and had the left one down to about 50% of the volume although this is totally regressed over the last 48 weeks. ?Further the right leg has reopened. she is trying to make a appointment with pain and vascular, previous ablations with Dr.  Aleda [name]. I do not believe there ?is an arterial insufficiency issue here ?02/01/15 the status of the adherent eschar bilaterally is actually improved. No debridement was done. She did not manage to get vascular studies done ?02/08/15 continued debridement of the area was done today. The slough is less adherent and comes off with less pressure. There is no surrounding infection ?peripheral pulses are intact ?02/15/15 selective debridement with a disposable curette. Again the slough is less adherent and comes off with less difficulty. No surrounding infection ?peripheral pulses are intact. ?02/22/15 selective debridement of the right medial ankle wound. Slough comes off with less difficulty. No obvious surrounding infection peripheral pulses are ?intact I did not debridement the one on the left. Both of these are stable to improved ?03/01/15 selective debridement of both wound areas using a curette to. Adherent slough cup soft with less difficulty. No obvious surrounding infection. The ?patient tells me that 2 days ago she noted a rash above the right leg wrap. She did not have this on her lower legs when she change this over she arrives with ?widespread left greater than right almost folliculitis-looking rash which is extremely pruritic. I don't see anything to culture here. There is no rash on the rest of ?her body. She feels well systemically. ?03/08/15; selective debridement of both wounds using a curette. Base of this does not look unhealthy. She had limegreen drainage coming out of the left leg ?wound and describes a lot of drainage. The rash on her left leg looks improved to. No cultures were done. ?03/22/15; patient was not here last week. Basal wounds does not look healthy and there is no surrounding erythema. No drainage. There is still a  rash on the ?left leg that almost looks vasculitic however it is clearly limited to the top of where the wrap would be. ?04/05/15; on the right required a surgical debridement  of surface eschar and necrotic subcutaneous tissue. I did not debridement the area on the left. These ?continue to be large open wounds that are not changing that much. We were successful at one point in heal

## 2021-08-28 NOTE — Progress Notes (Signed)
[name], [name] ([id number]) ?Visit Report for 08/28/2021 ?Arrival Information Details ?Patient Name: Date of Service: ?[name], [name]. 08/28/2021 9:30 A M ?Medical Record Number: [id number] ?Patient Account Number: [id number] ?Date of Birth/Sex: Treating RN: ?[birth year] (66 y.o. [name] ?Primary Care Pecola Haxton: [name] Other Clinician: ?Referring Daylyn Azbill: ?Treating Broedy Osbourne/Extender: [name] ?Redmon, Noelle ?Weeks in Treatment: 37 ?Visit Information History Since Last Visit ?Added or deleted any medications: No ?Patient Arrived: Kasandra [name] ?Any new allergies or adverse reactions: No ?Arrival Time: 09:33 ?Had a fall or experienced change in No ?Transfer Assistance: None ?activities of daily living that may affect ?Patient Identification Verified: Yes ?risk of falls: ?Secondary Verification Process Completed: Yes ?Signs or symptoms of abuse/neglect since last visito No ?Patient Requires Transmission-Based Precautions: No ?Hospitalized since last visit: No ?Patient Has Alerts: Yes ?Implantable device outside of the clinic excluding No ?Patient Alerts: L ABI =1.12, TBI = .92 ?cellular tissue based products placed in the center ?R ABI= 1.02, TBI= .58 ?since last visit: ?Has Dressing in Place as Prescribed: Yes ?Has Compression in Place as Prescribed: Yes ?Pain Present Now: No ?Electronic Signature(s) ?Signed: 08/28/2021 4:27:37 PM By: [name] ?Entered By: [name] on 08/28/2021 09:35:29 ?-------------------------------------------------------------------------------- ?Compression Therapy Details ?Patient Name: Date of Service: ?[name], [name]. 08/28/2021 9:30 A M ?Medical Record Number: [id number] ?Patient Account Number: [id number] ?Date of Birth/Sex: Treating RN: ?[birth year] (66 y.o. [name] ?Primary Care Thandiwe Siragusa: [name] Other Clinician: ?Referring Manpreet Strey: ?Treating Estella Malatesta/Extender: [name] ?Redmon, Noelle ?Weeks in Treatment: 1 ?Compression Therapy  Performed for Wound Assessment: Non-Wound Location ?Performed By: Clinician [name], RN ?Compression Type: Double Layer ?Location: Lower Extremity, Left ?Post Procedure Diagnosis ?Same as Pre-procedure ?Electronic Signature(s) ?Signed: 08/28/2021 4:27:37 PM By: [name] ?Entered By: [name] on 08/28/2021 10:08:42 ?-------------------------------------------------------------------------------- ?Encounter Discharge Information Details ?Patient Name: ?Date of Service: ?[name], [name]. 08/28/2021 9:30 A M ?Medical Record Number: [id number] ?Patient Account Number: [id number] ?Date of Birth/Sex: ?Treating RN: ?12/18/1955 (66 y.o. [name] ?Primary Care [name]: [name] ?Other Clinician: ?Referring Khali Albanese: ?Treating Aarion Metzgar/Extender: [name] ?Redmon, Noelle ?Weeks in Treatment: 18 ?Encounter Discharge Information Items ?Discharge Condition: Stable ?Ambulatory Status: Kasandra [name] ?Discharge Destination: Home ?Transportation: Private Auto ?Schedule Follow-up Appointment: Yes ?Clinical Summary of Care: Provided on 08/28/2021 ?Form Type Recipient ?Paper Patient Patient ?Electronic Signature(s) ?Signed: 08/28/2021 4:27:37 PM By: [name] ?Entered By: [name] on 08/28/2021 10:25:58 ?-------------------------------------------------------------------------------- ?Lower Extremity Assessment Details ?Patient Name: ?Date of Service: ?[name], [name]. 08/28/2021 9:30 A M ?Medical Record Number: [id number] ?Patient Account Number: [id number] ?Date of Birth/Sex: ?Treating RN: ?[birth year] (66 y.o. [name] ?Primary Care [name]: [name] ?Other Clinician: ?Referring [name]: ?Treating [name]/Extender: [name] ?Redmon, Noelle ?Weeks in Treatment: 18 ?Edema Assessment ?Assessed: [Left: Yes] [Right: No] ?Edema: [Left: N] [Right: o] ?Calf ?Left: Right: ?Point of Measurement: 31 cm From Medial Instep 27 cm ?Ankle ?Left: Right: ?Point of Measurement: 11 cm From  Medial Instep 21 cm ?Vascular Assessment ?Pulses: ?Dorsalis Pedis ?Palpable: [Left:Yes] ?Electronic Signature(s) ?Signed: 08/28/2021 4:27:37 PM By: [name] ?Entered By: [name] on 08/28/2021 09:46:05 ?-------------------------------------------------------------------------------- ?Multi-Disciplinary Care Plan Details ?Patient Name: ?Date of Service: ?[name], [name]. 08/28/2021 9:30 A M ?Medical Record Number: [id number] ?Patient Account Number: [id number] ?Date of Birth/Sex: ?Treating RN: ?[birth year] (66 y.o. [name] ?Primary Care Keyauna Graefe: [name] ?Other Clinician: ?Referring [name]: ?Treating [name]/Extender: [name] ?Redmon, Noelle ?Weeks in Treatment: 13 ?  Multidisciplinary Care Plan reviewed with physician ?Active Inactive ?Venous Leg Ulcer ?Nursing Diagnoses: ?Actual venous Insuffiency (use after diagnosis is confirmed) ?Knowledge deficit related to disease process and management ?Goals: ?Patient will maintain optimal edema control ?Date Initiated: 06/20/2020 ?Target Resolution Date: 09/25/2021 ?Goal Status: Active ?Interventions: ?Assess peripheral edema status every visit. ?Compression as ordered ?Treatment Activities: ?Therapeutic compression applied : 06/20/2020 ?Notes: ?01/30/21: Edema control ongoing, using CoFlex wrap. 06/05/21: Edema control continues ?Wound/Skin Impairment ?Nursing Diagnoses: ?Impaired tissue integrity ?Knowledge deficit related to ulceration/compromised skin integrity ?Goals: ?Patient/caregiver will verbalize understanding of skin care regimen ?Date Initiated: 01/17/2020 ?Target Resolution Date: 09/25/2021 ?Goal Status: Active ?Ulcer/skin breakdown will have a volume reduction of 30% by week 4 ?Date Initiated: 01/17/2020 ?Date Inactivated: 03/12/2020 ?Target Resolution Date: 03/09/2020 ?Goal Status: Met ?Interventions: ?Assess patient/caregiver ability to obtain necessary supplies ?Assess patient/caregiver ability to perform ulcer/skin care regimen  upon admission and as needed ?Assess ulceration(s) every visit ?Provide education on ulcer and skin care ?Notes: ?01/30/21: Wound care regimen continues, PuraPly skin sub being used. 04/17/21: Wound healed 06/05/21: Wound re-opened, wound care regimen continues. ?Electronic Signature(s) ?Signed: 08/28/2021 4:27:37 PM By: [name] ?Entered By: [name] on 08/28/2021 09:48:16 ?-------------------------------------------------------------------------------- ?Pain Assessment Details ?Patient Name: ?Date of Service: ?[name], [name]. 08/28/2021 9:30 A M ?Medical Record Number: [id number] ?Patient Account Number: [id number] ?Date of Birth/Sex: ?Treating RN: ?1955-11-15 (66 y.o. [name] ?Primary Care Shawn Dannenberg: [name] ?Other Clinician: ?Referring Tyanne Derocher: ?Treating Riniyah Speich/Extender: [name] ?Redmon, Noelle ?Weeks in Treatment: 62 ?Active Problems ?Location of Pain Severity and Description of Pain ?Patient Has Paino No ?Site Locations ?Pain Management and Medication ?Current Pain Management: ?Electronic Signature(s) ?Signed: 08/28/2021 4:27:37 PM By: [name] ?Entered By: [name] on 08/28/2021 09:36:01 ?-------------------------------------------------------------------------------- ?Patient/Caregiver Education Details ?Patient Name: ?Date of Service: ?[name], [name]. 3/29/2023andnbsp9:30 A M ?Medical Record Number: [id number] ?Patient Account Number: [id number] ?Date of Birth/Gender: ?Treating RN: ?[birth year] (66 y.o. [name] ?Primary Care Physician: [name] ?Other Clinician: ?Referring Physician: ?Treating Physician/Extender: [name] ?Redmon, Noelle ?Weeks in Treatment: 52 ?Education Assessment ?Education Provided To: ?Patient ?Education Topics Provided ?Venous: ?Methods: Explain/Verbal, Printed ?Responses: State content correctly ?Wound/Skin Impairment: ?Methods: Explain/Verbal, Printed ?Responses: State content correctly ?Electronic  Signature(s) ?Signed: 08/28/2021 4:27:37 PM By: [name] ?Entered By: [name] on 08/28/2021 09:48:48 ?-------------------------------------------------------------------------------- ?Wound Assessment Details ?Patie

## 2021-09-04 ENCOUNTER — Encounter (HOSPITAL_BASED_OUTPATIENT_CLINIC_OR_DEPARTMENT_OTHER): Payer: Medicare Other | Attending: Physician Assistant | Admitting: Physician Assistant

## 2021-09-04 DIAGNOSIS — L97328 Non-pressure chronic ulcer of left ankle with other specified severity: Secondary | ICD-10-CM | POA: Insufficient documentation

## 2021-09-04 DIAGNOSIS — I87332 Chronic venous hypertension (idiopathic) with ulcer and inflammation of left lower extremity: Secondary | ICD-10-CM | POA: Insufficient documentation

## 2021-09-04 DIAGNOSIS — L97828 Non-pressure chronic ulcer of other part of left lower leg with other specified severity: Secondary | ICD-10-CM | POA: Insufficient documentation

## 2021-09-04 DIAGNOSIS — I10 Essential (primary) hypertension: Secondary | ICD-10-CM | POA: Diagnosis not present

## 2021-09-04 NOTE — Progress Notes (Addendum)
KHIA, DIETERICH ([id number]) ?Visit Report for 09/04/2021 ?Chief Complaint Document Details ?Patient Name: Date of Service: ?[name], [name] 09/04/2021 9:30 A M ?Medical Record Number: [id number] ?Patient Account Number: [id number] ?Date of Birth/Sex: Treating RN: ?[birth year] (66 y.o. [name] Lush ?Primary Care Provider: [name] Other Clinician: ?Referring Provider: ?Treating Provider/Extender: [name] ?Redmon, Noelle ?Weeks in Treatment: 75 ?Information Obtained from: Patient ?Chief Complaint ?patient is been followed long-term in this clinic for venous insufficiency ulcers with inflammation, hypertension and ulceration over the medial ankle bilaterally. ?01/17/2020; this is a patient who is here for review of postoperative wounds on the left lateral ankle and recurrence of venous stasis ulceration on the left ?medial ?Electronic Signature(s) ?Signed: 09/04/2021 10:00:04 AM By: [name] PA-C ?Entered By: [name] on 09/04/2021 10:00:04 ?-------------------------------------------------------------------------------- ?HPI Details ?Patient Name: Date of Service: ?[name], [name] 09/04/2021 9:30 A M ?Medical Record Number: [id number] ?Patient Account Number: [id number] ?Date of Birth/Sex: Treating RN: ?[birth year] (66 y.o. [name] ?Primary Care Provider: [name] Other Clinician: ?Referring Provider: ?Treating Provider/Extender: [name] ?Redmon, Noelle ?Weeks in Treatment: 38 ?History of Present Illness ?HPI Description: the remaining wound is over the left medial ankle. Similar wound over the right medial ankle healed largely with use of Apligraf. Most recently ?we have been using Hydrofera Blue over this wound with considerable improvement. The patient has been extensively worked up in the past for her venous ?insufficiency and she is not a candidate for antireflux surgery although I have none of the details available currently. ?08/24/14; considerable improvement  today. About 50% of this wound areas now epithelialized. The base of the wound appears to be healthier granulation.as ?opposed to last week when she had deteriorated a considerable improvement ?08/17/14; unfortunately the wound has regressed somewhat. The areas of epithelialization from the superior aspect are not nearly as healthy as they were last ?week. The patient thinks her Hydrofera Blue slipped. ?09/07/14; unfortunately the area has markedly regressed in the 2 weeks since I've seen this. There is an odor surrounding erythema. The healthy granulation ?tissue that we had at the base of the wound now is a dusky color. The nurse reports green drainage ?09/14/14; the area looks somewhat better than last week. There is less erythema and less drainage. The culture I did did not show any growth. Nevertheless I ?think it is better to continue the Cipro and doxycycline for a further week. The remaining wound area was debridement. ?09/21/14. Wound did not require debridement last week. Still less erythema and less drainage. She can complete her antibiotics. The areas of epithelialization in ?the superior aspect of the wound do not look as healthy as they did some weeks ago ?10/05/14 continued improvement in the condition of this wound. There is advancing epithelialization. Less aggressive debridement required ?10/19/14 continued improvement in the condition and volume of this wound. Less aggressive debridement to the inferior part of this to remove surface slough ?and fibrinous eschar ?11/02/14 no debridement is required. The surface granulation appears healthy although some of her islands of epithelialization seem to have regressed. No ?evidence of infection ?11/16/14; lites surface debridement done of surface eschar. The wound does not look to be unhealthy. No evidence of infection. ?Unfortunately the patient has had podiatry issues in the right foot and for some reason has redeveloped small surface ulcerations in the medial  right ankle. Her ?original presentation involved wounds in this area ?11/23/14 no debridement. The area on the  right ankle has enlarged. The left ankle wound appears stable in terms of the surface although there is periwound ?inflammation. There has been regression in the amount of new skin ?11/30/14 no debridement. Both wound areas appear healthy. There was no evidence of infection. The the new area on the right medial ankle has enlarged ?although that both the surfaces appear to be stable. ?12/07/14; Debridement of the right medial ankle wound. No no debridement was done on the left. ?12/14/14 no major change in and now bilateral medial ankle wounds. Both of these are very painful but the no overt evidence of infection. She has had previous ?venous ablation ?12/21/14; patient states that her right medial ankle wound is considerably more painful last week than usual. Her left is also somewhat painful. She could not ?tolerate debridement. The right medial ankle wound has fibrinous surface eschar ?12/28/14 this is a patient with severe bilateral venous insufficiency ulcers. For a considerable period of time we actually had the one on the right medial ankle ?healed however this recently opened up again in June. The left medial ankle wound has been a refractory area with some absent flows. We had some success ?with Hydrofera Blue on this area and it literally closed by 50% however it is recently opened up Foley. Both of these were debridement today of surface ?eschar. She tolerates this poorly ?01/25/15: No change in the status of this. Thick adherent escar. Very poor tolerance of any attempt at debridement. I had healed the right medial malleolus ?wound for a considerable amount of time and had the left one down to about 50% of the volume although this is totally regressed over the last 48 weeks. ?Further the right leg has reopened. she is trying to make a appointment with pain and vascular, previous ablations with Dr.  Aleda Grana. I do not believe there ?is an arterial insufficiency issue here ?02/01/15 the status of the adherent eschar bilaterally is actually improved. No debridement was done. She did not manage to get vascular studies done ?02/08/15 continued debridement of the area was done today. The slough is less adherent and comes off with less pressure. There is no surrounding infection ?peripheral pulses are intact ?02/15/15 selective debridement with a disposable curette. Again the slough is less adherent and comes off with less difficulty. No surrounding infection ?peripheral pulses are intact. ?02/22/15 selective debridement of the right medial ankle wound. Slough comes off with less difficulty. No obvious surrounding infection peripheral pulses are ?intact I did not debridement the one on the left. Both of these are stable to improved ?03/01/15 selective debridement of both wound areas using a curette to. Adherent slough cup soft with less difficulty. No obvious surrounding infection. The ?patient tells me that 2 days ago she noted a rash above the right leg wrap. She did not have this on her lower legs when she change this over she arrives with ?widespread left greater than right almost folliculitis-looking rash which is extremely pruritic. I don't see anything to culture here. There is no rash on the rest of ?her body. She feels well systemically. ?03/08/15; selective debridement of both wounds using a curette. Base of this does not look unhealthy. She had limegreen drainage coming out of the left leg ?wound and describes a lot of drainage. The rash on her left leg looks improved to. No cultures were done. ?03/22/15; patient was not here last week. Basal wounds does not look healthy and there is no surrounding erythema. No drainage. There is still a  rash on the ?left leg that almost looks vasculitic however it is clearly limited to the top of where the wrap would be. ?04/05/15; on the right required a surgical debridement  of surface eschar and necrotic subcutaneous tissue. I did not debridement the area on the left. These ?continue to be large open wounds that are not changing that much. We were successful at one point in healing

## 2021-09-04 NOTE — Progress Notes (Signed)
NIAH, HEINLE ([id number]) ?Visit Report for 09/04/2021 ?Arrival Information Details ?Patient Name: Date of Service: ?[name] [name], [name] 09/04/2021 9:30 A M ?Medical Record Number: [id number] ?Patient Account Number: [id number] ?Date of Birth/Sex: Treating RN: ?1956-05-10 (66 y.o. [name] ?Primary Care [name]: [name] Other Clinician: ?Referring [name]: ?Treating [name]/Extender: [name] ?[name], [name] ?Weeks in Treatment: 52 ?Visit Information History Since Last Visit ?Added or deleted any medications: No ?Patient Arrived: [name] ?Any new allergies or adverse reactions: No ?Arrival Time: 09:47 ?Had a fall or experienced change in No ?Transfer Assistance: None ?activities of daily living that may affect ?Patient Identification Verified: Yes ?risk of falls: ?Secondary Verification Process Completed: Yes ?Signs or symptoms of abuse/neglect since last visito No ?Patient Requires Transmission-Based Precautions: No ?Hospitalized since last visit: No ?Patient Has Alerts: Yes ?Implantable device outside of the clinic excluding No ?Patient Alerts: L ABI =1.12, TBI = .92 ?cellular tissue based products placed in the center ?R ABI= 1.02, TBI= .58 ?since last visit: ?Has Dressing in Place as Prescribed: Yes ?Has Compression in Place as Prescribed: Yes ?Pain Present Now: No ?Electronic Signature(s) ?Signed: 09/04/2021 4:33:34 PM By: [name] ?Entered By: [name] on 09/04/2021 09:47:46 ?-------------------------------------------------------------------------------- ?Clinic Level of Care Assessment Details ?Patient Name: Date of Service: ?[name] [name], [name] 09/04/2021 9:30 A M ?Medical Record Number: [id number] ?Patient Account Number: [id number] ?Date of Birth/Sex: Treating RN: ?01/08/1956 (66 y.o. [name] ?Primary Care [name]: [name] Other Clinician: ?Referring [name]: ?Treating [name]/Extender: [name] ?[name], [name] ?Weeks in Treatment: 85 ?Clinic Level of  Care Assessment Items ?TOOL 4 Quantity Score ?X- 1 0 ?Use when only an EandM is performed on FOLLOW-UP visit ?ASSESSMENTS - Nursing Assessment / Reassessment ?X- 1 10 ?Reassessment of Co-morbidities (includes updates in patient status) ?X- 1 5 ?Reassessment of Adherence to Treatment Plan ?ASSESSMENTS - Wound and Skin A ssessment / Reassessment ?X - Simple Wound Assessment / Reassessment - one wound 1 5 ?[]  - 0 ?Complex Wound Assessment / Reassessment - multiple wounds ?[]  - 0 ?Dermatologic / Skin Assessment (not related to wound area) ?ASSESSMENTS - Focused Assessment ?[]  - 0 ?Circumferential Edema Measurements - multi extremities ?[]  - 0 ?Nutritional Assessment / Counseling / Intervention ?[]  - 0 ?Lower Extremity Assessment (monofilament, tuning fork, pulses) ?[]  - 0 ?Peripheral Arterial Disease Assessment (using hand held doppler) ?ASSESSMENTS - Ostomy and/or Continence Assessment and Care ?[]  - 0 ?Incontinence Assessment and Management ?[]  - 0 ?Ostomy Care Assessment and Management (repouching, etc.) ?PROCESS - Coordination of Care ?X - Simple Patient / Family Education for ongoing care 1 15 ?[]  - 0 ?Complex (extensive) Patient / Family Education for ongoing care ?[]  - 0 ?Staff obtains Consents, Records, T Results / Process Orders ?est ?[]  - 0 ?Staff telephones HHA, Nursing Homes / Clarify orders / etc ?[]  - 0 ?Routine Transfer to another Facility (non-emergent condition) ?[]  - 0 ?Routine Hospital Admission (non-emergent condition) ?[]  - 0 ?New Admissions / Biomedical engineer / Ordering NPWT Apligraf, etc. ?, ?[]  - 0 ?Emergency Hospital Admission (emergent condition) ?X- 1 10 ?Simple Discharge Coordination ?[]  - 0 ?Complex (extensive) Discharge Coordination ?PROCESS - Special Needs ?[]  - 0 ?Pediatric / Minor Patient Management ?[]  - 0 ?Isolation Patient Management ?[]  - 0 ?Hearing / Language / Visual special needs ?[]  - 0 ?Assessment of Community assistance (transportation, D/C planning, etc.) ?[]  -  0 ?Additional assistance / Altered mentation ?[]  - 0 ?Support Surface(s) Assessment (bed, cushion, seat, etc.) ?INTERVENTIONS -  Wound Cleansing / Measurement ?[]  - 0 ?Simple Wound Cleansing - one wound ?[]  - 0 ?Complex Wound Cleansing - multiple wounds ?X- 1 5 ?Wound Imaging (photographs - any number of wounds) ?[]  - 0 ?Wound Tracing (instead of photographs) ?[]  - 0 ?Simple Wound Measurement - one wound ?[]  - 0 ?Complex Wound Measurement - multiple wounds ?INTERVENTIONS - Wound Dressings ?[]  - 0 ?Small Wound Dressing one or multiple wounds ?[]  - 0 ?Medium Wound Dressing one or multiple wounds ?[]  - 0 ?Large Wound Dressing one or multiple wounds ?[]  - 0 ?Application of Medications - topical ?[]  - 0 ?Application of Medications - injection ?INTERVENTIONS - Miscellaneous ?[]  - 0 ?External ear exam ?[]  - 0 ?Specimen Collection (cultures, biopsies, blood, body fluids, etc.) ?[]  - 0 ?Specimen(s) / Culture(s) sent or taken to Lab for analysis ?[]  - 0 ?Patient Transfer (multiple staff / Civil Service fast streamer / Similar devices) ?[]  - 0 ?Simple Staple / Suture removal (25 or less) ?[]  - 0 ?Complex Staple / Suture removal (26 or more) ?[]  - 0 ?Hypo / Hyperglycemic Management (close monitor of Blood Glucose) ?[]  - 0 ?Ankle / Brachial Index (ABI) - do not check if billed separately ?X- 1 5 ?Vital Signs ?Has the patient been seen at the hospital within the last three years: Yes ?Total Score: 55 ?Level Of Care: New/Established - Level 2 ?Electronic Signature(s) ?Signed: 09/04/2021 4:33:34 PM By: [name] ?Entered By: [name] on 09/04/2021 10:10:53 ?-------------------------------------------------------------------------------- ?Encounter Discharge Information Details ?Patient Name: Date of Service: ?[name] [name], [name] 09/04/2021 9:30 A M ?Medical Record Number: [id number] ?Patient Account Number: [id number] ?Date of Birth/Sex: Treating RN: ?September 09, 1955 (66 y.o. [name] ?Primary Care [name]: [name] Other  Clinician: ?Referring [name]: ?Treating Nalea Salce/Extender: [name] ?[name], [name] ?Weeks in Treatment: 11 ?Encounter Discharge Information Items ?Discharge Condition: Stable ?Ambulatory Status: [name] ?Discharge Destination: Home ?Transportation: Private Auto ?Schedule Follow-up Appointment: No ?Clinical Summary of Care: Provided on 09/04/2021 ?Form Type Recipient ?Paper Patient Patient ?Electronic Signature(s) ?Signed: 09/04/2021 4:33:34 PM By: [name] ?Entered By: [name] on 09/04/2021 10:12:33 ?-------------------------------------------------------------------------------- ?Lower Extremity Assessment Details ?Patient Name: Date of Service: ?[name] [name], [name] 09/04/2021 9:30 A M ?Medical Record Number: [id number] ?Patient Account Number: [id number] ?Date of Birth/Sex: Treating RN: ?05/12/1956 (66 y.o. [name] ?Primary Care Findley Blankenbaker: [name] Other Clinician: ?Referring Finola Rosal: ?Treating Jaziah Goeller/Extender: [name] ?[name], [name] ?Weeks in Treatment: 85 ?Edema Assessment ?Assessed: [Left: Yes] [Right: No] ?Edema: [Left: N] [Right: o] ?Calf ?Left: Right: ?Point of Measurement: 31 cm From Medial Instep 27 cm ?Ankle ?Left: Right: ?Point of Measurement: 11 cm From Medial Instep 21 cm ?Vascular Assessment ?Pulses: ?Dorsalis Pedis ?Palpable: [Left:Yes] ?Electronic Signature(s) ?Signed: 09/04/2021 4:33:34 PM By: [name] ?Entered By: [name] on 09/04/2021 09:58:38 ?-------------------------------------------------------------------------------- ?Multi-Disciplinary Care Plan Details ?Patient Name: ?Date of Service: ?[name] [name], [name] 09/04/2021 9:30 A M ?Medical Record Number: [id number] ?Patient Account Number: [id number] ?Date of Birth/Sex: ?Treating RN: ?30-Oct-1955 (66 y.o. [name] ?Primary Care Iysha Mishkin: [name] ?Other Clinician: ?Referring Drayson Dorko: ?Treating Trevyn Lumpkin/Extender: [name] ?[name], [name] ?Weeks in Treatment: 52 ?Multidisciplinary  Care Plan reviewed with physician ?Active Inactive ?Electronic Signature(s) ?Signed: 09/04/2021 4:33:34 PM By: [name] ?Entered By: [name] on 09/04/2021 10:08:00 ?--------------------------------------------

## 2021-11-01 ENCOUNTER — Ambulatory Visit (INDEPENDENT_AMBULATORY_CARE_PROVIDER_SITE_OTHER): Payer: Medicare Other

## 2021-11-01 ENCOUNTER — Telehealth: Payer: Self-pay | Admitting: Podiatry

## 2021-11-01 DIAGNOSIS — M216X2 Other acquired deformities of left foot: Secondary | ICD-10-CM

## 2021-11-01 DIAGNOSIS — M21542 Acquired clubfoot, left foot: Secondary | ICD-10-CM

## 2021-11-01 DIAGNOSIS — M216X1 Other acquired deformities of right foot: Secondary | ICD-10-CM | POA: Diagnosis not present

## 2021-11-01 NOTE — Telephone Encounter (Signed)
Patient had an appointment with Aaron Edelman. Patient wanted to talk to you about issues she is having since her surgery. I offered her to make an appointment, pt wanted me to send you a message. Patient would like to speak to you.

## 2021-11-01 NOTE — Telephone Encounter (Signed)
Spoke with patient. She said she will call for an appt. - Dr. [name]

## 2021-11-01 NOTE — Progress Notes (Signed)
SITUATION: Reason for Visit: Fitting and Delivery of Custom Fabricated Foot Orthoses Patient Report: Patient reports comfort and is satisfied with device.  OBJECTIVE DATA: Patient History / Diagnosis:     ICD-10-CM   1. 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 Patient tolerated fittign procedure.   Patient was provided with verbal and written instruction and demonstration regarding donning, doffing, wear, care, proper fit, function, purpose, cleaning, and use of the orthosis and in all related precautions and risks and benefits regarding the orthosis.  Patient was also provided with verbal instruction regarding how to report any failures or malfunctions of the orthosis and necessary follow up care. Patient was also instructed to contact our office regarding any change in status that may affect the function of the orthosis.  Patient demonstrated independence with proper donning, doffing, and fit and verbalized understanding of all instructions.  PLAN: Patient is to follow up in one week or as necessary (PRN). All questions were answered and concerns addressed. Plan of care was discussed with and agreed upon by the patient.

## 2021-11-05 ENCOUNTER — Ambulatory Visit (INDEPENDENT_AMBULATORY_CARE_PROVIDER_SITE_OTHER): Payer: Medicare Other | Admitting: Podiatry

## 2021-11-05 ENCOUNTER — Encounter: Payer: Self-pay | Admitting: Podiatry

## 2021-11-05 ENCOUNTER — Ambulatory Visit (INDEPENDENT_AMBULATORY_CARE_PROVIDER_SITE_OTHER): Payer: Medicare Other

## 2021-11-05 DIAGNOSIS — T8484XA Pain due to internal orthopedic prosthetic devices, implants and grafts, initial encounter: Secondary | ICD-10-CM

## 2021-11-05 DIAGNOSIS — Z9889 Other specified postprocedural states: Secondary | ICD-10-CM

## 2021-11-05 DIAGNOSIS — G5792 Unspecified mononeuropathy of left lower limb: Secondary | ICD-10-CM | POA: Diagnosis not present

## 2021-11-05 MED ORDER — GABAPENTIN 100 MG PO CAPS: 100.0000 mg | ORAL_CAPSULE | Freq: Three times a day (TID) | ORAL | 0 refills | Status: DC

## 2021-11-05 NOTE — Progress Notes (Signed)
Subjective:  Patient presents for follow-up evaluation of left foot pain.  Patient does have a past surgical history of tibiotalar calcaneal arthrodesis left lower extremity. DOS: 12/15/2019.  Patient states that the majority the patient's current symptoms are isolated to the lateral aspect of the left heel.  She is concerned because there is palpable hardware to this area.  She does have history of chronic recurrent wounds secondary to venous insufficiency and she is concerned that the wound would develop to this area.  She also experiences some numbness and burning sensations to the plantar aspect of the foot.  Past Medical History:  Diagnosis Date   Allergy    Ankle wound 02/2016   bilateral   Dental bridge present    lower   Dental crowns present    upper right   Open wounds involving multiple regions of lower extremity    Peripheral vascular disease ([place])    venous stasis ulcers bilateral lower extremities   PONV (postoperative nausea and vomiting)    Objective/Physical Exam Neurovascular status intact.  The rearfoot is in a rectus position with good alignment of the foot in relationship to the leg.  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  From the midtarsal joint distal, noted forefoot adductus deformity.  Ankle joint almost 90 degrees.  There is some palpable orthopedic hardware noted to the lateral aspect of the calcaneus which has becomes more symptomatic since last visit.  There is some mild erythema to this area also overlying this palpable region of the orthopedic hardware.  Currently no open wounds noted.  Hyperkeratotic dystrophic onychomycotic nails also noted 1-5 bilateral; improved  Radiographic exam LT foot and ankle 11/05/2021: Orthopedic hardware appears intact and stable.  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  On oblique views you can visualize the distal portion of the lateral plate very close to the overlying  skin.  Assessment: 1. s/p tibiotalar calcaneal arthrodesis left. DOS: 12/15/2019 2.  Postsurgical edema left-mostly resolved 3.  Ulcer right medial ankle secondary to venous insufficiency; currently healed 4.  History of recurrent ulcers secondary to venous insufficiency 5.  Onychomycosis of toenails bilateral; improving 6.  Palpable lateral calcaneal plate hardware  Plan of Care:  1. Patient was evaluated. 2.  Today we discussed the concern of the lateral TTC plate tenting the skin at the distal heel and the potential for a wound to develop in this area.  The patient has a history of chronic wounds specifically to the left lower extremity.  Discussing the situation I do believe it would be in the best interest of the patient to remove the hardware/TTC plate to prevent wound from developing to the lateral aspect of the heel.  Currently it is very symptomatic and tender to palpation and the patient agrees.  Risk benefits advantages and disadvantages were explained.  Postoperative recovery was explained.  All patient questions were answered.  No guarantees were expressed or implied. 3.  Authorization for surgery was initiated.  Surgery will consist of removal of orthopedic hardware LT ankle.  The hardware is in Integra TTC lateral ankle plate. 4.  Also to address the numbness and burning sensation to the plantar aspect of the foot, the patient declined steroid injection.  Prescription for gabapentin 100 mg 3 times daily. 5.  Return to clinic 1 week postop   Edrick Kins, DPM [facility]  Dr. Edrick Kins, DPM    [address]  [place], [place] [address]                Office [phone]  Fax [phone]

## 2022-01-06 ENCOUNTER — Ambulatory Visit (INDEPENDENT_AMBULATORY_CARE_PROVIDER_SITE_OTHER): Payer: Medicare Other | Admitting: Podiatry

## 2022-01-06 DIAGNOSIS — T8484XA Pain due to internal orthopedic prosthetic devices, implants and grafts, initial encounter: Secondary | ICD-10-CM | POA: Diagnosis not present

## 2022-01-06 NOTE — Progress Notes (Signed)
Chief Complaint  Patient presents with   Consult    sx consult    Subjective:  Patient presents for follow-up evaluation of left foot pain and to schedule surgical removal of the hardware to the lateral aspect of the left ankle..  Patient does have a past surgical history of tibiotalar calcaneal arthrodesis left lower extremity. DOS: 12/15/2019.  The distal plate is tenting the skin of the lateral aspect of the calcaneus.  It is very palpable and the patient states it is painful on a daily basis.  She has tried wearing different shoe gear to help alleviate pressure and pain from this area with minimal improvement.  She is concerned that this area of palpable hardware has the potential for causing a wound.  She presents today to schedule surgery and for further treatment and evaluation  Past Medical History:  Diagnosis Date   Allergy    Ankle wound 02/2016   bilateral   Dental bridge present    lower   Dental crowns present    upper right   Open wounds involving multiple regions of lower extremity    Peripheral vascular disease ([place])    venous stasis ulcers bilateral lower extremities   PONV (postoperative nausea and vomiting)    Objective/Physical Exam Neurovascular status intact.  The rearfoot is in a more rectus position with good alignment of the foot in relationship to the leg.  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  From the midtarsal joint distal, noted forefoot adductus deformity.  Ankle joint almost 90 degrees.  There is some palpable orthopedic hardware noted to the lateral aspect of the calcaneus which has becomes more symptomatic since last visit.  There is some mild erythema to this area also overlying this palpable region of the orthopedic hardware.  Currently no open wounds noted.  Hyperkeratotic dystrophic onychomycotic nails also noted 1-5 bilateral; improved  Radiographic exam LT foot and ankle 11/05/2021: Orthopedic hardware appears intact and  stable.  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  On oblique views you can visualize the distal portion of the lateral plate very close to the overlying skin.  Assessment: 1. s/p tibiotalar calcaneal arthrodesis left. DOS: 12/15/2019 2.  Ulcer right medial ankle secondary to venous insufficiency; healed 3.  History of recurrent ulcers secondary to venous insufficiency 4.  Palpable lateral calcaneal plate hardware  Plan of Care:  1. Patient was evaluated. 2.  Today again we discussed the concern of the lateral TTC plate tenting the skin at the distal heel and the potential for a wound to develop in this area.  Patient states that the area is constantly tender and painful.  She has tried multiple different shoes without any alleviation of pressure from this area.  There is constant concern for ulceration since it is tenting the skin and very palpable.  I do feel it appropriate to remove the orthopedic hardware now to the lateral aspect of the ankle since it is very symptomatic on a daily basis and has potential for ulcer development despite multiple attempts at different shoe gear 3.  Authorization for surgery was initiated again today.  Surgery will consist of removal of orthopedic hardware LT ankle.  The hardware is in Integra TTC lateral ankle plate. 4.  Continue gabapentin 100 mg 3 times daily. 5.  Return to clinic 1 week postop   Edrick Kins, DPM [facility]  Dr. Edrick Kins, DPM    [address]  [place],  [address]                Office [phone]  Fax [phone]

## 2022-01-07 ENCOUNTER — Encounter: Payer: Self-pay | Admitting: Podiatry

## 2022-01-08 ENCOUNTER — Telehealth: Payer: Self-pay | Admitting: Urology

## 2022-01-08 NOTE — Telephone Encounter (Signed)
DOS - 01/30/22  REMOVAL FIXATION DEEP LEFT --- 20680  [employer] EFFECTIVE DATE - 06/02/21  PLAN DEDUCTIBLE - $0.00 OUT OF POCKET - $500.00 W/ $364.00 REMAINING COINSURANCE - 0% COPAY - $125.00   PER [employer] WEBSITE FOR CPT CODE 79810 Notification or Prior Authorization is not required for the requested services  This [employer] plan does not currently require a prior authorization for these services. If you have general questions about the prior authorization requirements, please call us at [phone] or visit [email or web address] > Clinician Resources > Advance and Admission Notification Requirements. The number above acknowledges your notification. Please write this number down for future reference. Notification is not a guarantee of coverage or payment.  Decision ID #:[id number]

## 2022-01-10 ENCOUNTER — Other Ambulatory Visit: Payer: Self-pay | Admitting: Physician Assistant

## 2022-01-10 ENCOUNTER — Ambulatory Visit
Admission: RE | Admit: 2022-01-10 | Discharge: 2022-01-10 | Disposition: A | Discharge status: Home / Self Care | Payer: Medicare Other | Source: Ambulatory Visit | Attending: Physician Assistant | Admitting: Physician Assistant

## 2022-01-10 DIAGNOSIS — Z01818 Encounter for other preprocedural examination: Secondary | ICD-10-CM

## 2022-01-30 ENCOUNTER — Other Ambulatory Visit: Payer: Self-pay | Admitting: Podiatry

## 2022-01-30 DIAGNOSIS — Z4889 Encounter for other specified surgical aftercare: Secondary | ICD-10-CM | POA: Diagnosis not present

## 2022-01-30 MED ORDER — OXYCODONE-ACETAMINOPHEN 10-325 MG PO TABS: 1.0000 | ORAL_TABLET | ORAL | 0 refills | Status: DC | PRN

## 2022-01-30 NOTE — Progress Notes (Signed)
PRN postop 

## 2022-01-31 ENCOUNTER — Other Ambulatory Visit: Payer: Self-pay | Admitting: Podiatry

## 2022-01-31 MED ORDER — ONDANSETRON HCL 4 MG PO TABS: 4.0000 mg | ORAL_TABLET | Freq: Three times a day (TID) | ORAL | 0 refills | Status: DC | PRN

## 2022-02-05 ENCOUNTER — Ambulatory Visit (INDEPENDENT_AMBULATORY_CARE_PROVIDER_SITE_OTHER): Payer: Medicare Other | Admitting: Podiatry

## 2022-02-05 ENCOUNTER — Ambulatory Visit (INDEPENDENT_AMBULATORY_CARE_PROVIDER_SITE_OTHER): Payer: Medicare Other

## 2022-02-05 DIAGNOSIS — Z9889 Other specified postprocedural states: Secondary | ICD-10-CM

## 2022-02-05 NOTE — Progress Notes (Signed)
Chief Complaint  Patient presents with   Routine Post Op    POV #1 DOS 01/30/2022 REMOVAL OF HARDWARE LT ANKLE, No N/V/F/C/SOB, pt has some pain and swelling, pain rate is a 4 out of 10, TX: walker and Cam Boot, pain meds,  X-Rays taken today     Subjective:  Patient presents today status post removal of hardware left ankle. DOS: 01/30/2022.  Patient states that she is doing very well.  Pain is tolerable.  She did have some postoperative nausea however a prescription for antiemetic was sent to the pharmacy and she is doing well.  She is minimally weightbearing in the cam boot with the assistance of a walker  Past Medical History:  Diagnosis Date   Allergy    Ankle wound 02/2016   bilateral   Dental bridge present    lower   Dental crowns present    upper right   Open wounds involving multiple regions of lower extremity    Peripheral vascular disease ([place])    venous stasis ulcers bilateral lower extremities   PONV (postoperative nausea and vomiting)     Past Surgical History:  Procedure Laterality Date   APPLICATION OF WOUND VAC Bilateral 12/12/2015   Procedure: APPLICATION OF WOUND VAC;  Surgeon: [name], DO;  Location: [place];  Service: Plastics;  Laterality: Bilateral;   DRESSING CHANGE UNDER ANESTHESIA Right 02/14/2016   Procedure: DRESSING CHANGE UNDER ANESTHESIA;  Surgeon: [name], DO;  Location: [place];  Service: Plastics;  Laterality: Right;   ECTOPIC PREGNANCY SURGERY  1996   I & D EXTREMITY Bilateral 12/12/2015   Procedure: DEBRIDEMENT OF BILATERAL LEG WOUNDS;  Surgeon: [name], DO;  Location: [place];  Service: Plastics;  Laterality: Bilateral;   I & D EXTREMITY Bilateral 01/07/2016   Procedure: IRRIGATION AND DEBRIDEMENT OF BILATERAL LEGS;  Surgeon: [name], DO;  Location: [place];  Service: Plastics;  Laterality: Bilateral;   PERIPHERAL VASCULAR  CATHETERIZATION N/A 10/15/2015   Procedure: Abdominal Aortogram w/Lower Extremity;  Surgeon: Angelia Mould, MD;  Location: [place] [facility];  Service: Cardiovascular;  Laterality: N/A;   RADIOLOGY WITH ANESTHESIA N/A 02/24/2019   Procedure: MIR CERVICAL WITH AND WITHOUT CONTRAST;  Surgeon: Radiologist, Medication, MD;  Location: [place];  Service: Radiology;  Laterality: N/A;   skin graph     TONSILLECTOMY     as a child   VEIN LIGATION AND STRIPPING Left late 1990's   WOUND DEBRIDEMENT Left 02/14/2016   Procedure: SKIN GRAFT SPLIT THICKNESS TO LEFT ANKLE;  Surgeon: [name], DO;  Location: [place];  Service: Plastics;  Laterality: Left;    Allergies  Allergen Reactions   Penicillins Hives and Itching    Did it involve swelling of the face/tongue/throat, SOB, or low BP? No Did it involve sudden or severe rash/hives, skin peeling, or any reaction on the inside of your mouth or nose? Yes Did you need to seek medical attention at a hospital or doctor's office? No When did it last happen?      10+ years If all above answers are "NO", may proceed with cephalosporin use.     Adhesive [Tape] Itching   Doxycycline Nausea And Vomiting   Latex Itching    Objective/Physical Exam Neurovascular status intact.  Skin incisions appear to be well coapted with  staples intact. No sign of infectious process noted. No dehiscence. No active bleeding  noted.  Negative for any significant edema noted to the surgical extremity.  Over all well-healing surgical site  Radiographic Exam:  Absence of the orthopedic hardware noted. No acute fracture or osseous abnormality  Assessment: 1. s/p removal of hardware left ankle. DOS: 01/30/2022   Plan of Care:  1. Patient was evaluated. X-rays reviewed 2. Dressings changed. CDI x 1 week 3. Continue minimal weight bearing in the CAM boot.  4. RTC 1 week   Edrick Kins, DPM [facility]  Dr. Edrick Kins, DPM     [address] [place], [place] [address]                Office [phone]  Fax [phone]

## 2022-02-12 ENCOUNTER — Ambulatory Visit (INDEPENDENT_AMBULATORY_CARE_PROVIDER_SITE_OTHER): Payer: Medicare Other | Admitting: Podiatry

## 2022-02-12 DIAGNOSIS — Z9889 Other specified postprocedural states: Secondary | ICD-10-CM

## 2022-02-12 NOTE — Progress Notes (Signed)
Chief Complaint  Patient presents with   Post-op Follow-up    POV #2 DOS 01/30/2022 REMOVAL OF HARDWARE LT ANKLE, patient states that everything is healing well some itching at times.    Subjective:  Patient presents today status post removal of hardware left ankle. DOS: 01/30/2022.  Patient continues to do well.  She is weightbearing in the cam boot with the assistance of a cane.  No new complaints at this time  Past Medical History:  Diagnosis Date   Allergy    Ankle wound 02/2016   bilateral   Dental bridge present    lower   Dental crowns present    upper right   Open wounds involving multiple regions of lower extremity    Peripheral vascular disease ([place])    venous stasis ulcers bilateral lower extremities   PONV (postoperative nausea and vomiting)     Past Surgical History:  Procedure Laterality Date   APPLICATION OF WOUND VAC Bilateral 12/12/2015   Procedure: APPLICATION OF WOUND VAC;  Surgeon: [name], DO;  Location: [place];  Service: Plastics;  Laterality: Bilateral;   DRESSING CHANGE UNDER ANESTHESIA Right 02/14/2016   Procedure: DRESSING CHANGE UNDER ANESTHESIA;  Surgeon: [name], DO;  Location: [place];  Service: Plastics;  Laterality: Right;   ECTOPIC PREGNANCY SURGERY  1996   I & D EXTREMITY Bilateral 12/12/2015   Procedure: DEBRIDEMENT OF BILATERAL LEG WOUNDS;  Surgeon: [name], DO;  Location: [place];  Service: Plastics;  Laterality: Bilateral;   I & D EXTREMITY Bilateral 01/07/2016   Procedure: IRRIGATION AND DEBRIDEMENT OF BILATERAL LEGS;  Surgeon: [name], DO;  Location: [place];  Service: Plastics;  Laterality: Bilateral;   PERIPHERAL VASCULAR CATHETERIZATION N/A 10/15/2015   Procedure: Abdominal Aortogram w/Lower Extremity;  Surgeon: Angelia Mould, MD;  Location: [facility];  Service: Cardiovascular;  Laterality: N/A;    RADIOLOGY WITH ANESTHESIA N/A 02/24/2019   Procedure: MIR CERVICAL WITH AND WITHOUT CONTRAST;  Surgeon: Radiologist, Medication, MD;  Location: [facility];  Service: Radiology;  Laterality: N/A;   skin graph     TONSILLECTOMY     as a child   VEIN LIGATION AND STRIPPING Left late 1990's   WOUND DEBRIDEMENT Left 02/14/2016   Procedure: SKIN GRAFT SPLIT THICKNESS TO LEFT ANKLE;  Surgeon: [name], DO;  Location: [facility];  Service: Plastics;  Laterality: Left;    Allergies  Allergen Reactions   Penicillins Hives and Itching    Did it involve swelling of the face/tongue/throat, SOB, or low BP? No Did it involve sudden or severe rash/hives, skin peeling, or any reaction on the inside of your mouth or nose? Yes Did you need to seek medical attention at a hospital or doctor's office? No When did it last happen?      10+ years If all above answers are "NO", may proceed with cephalosporin use.     Adhesive [Tape] Itching   Doxycycline Nausea And Vomiting   Latex Itching    Objective/Physical Exam Neurovascular status intact.  Skin incisions appear to be well coapted with  staples intact. No sign of infectious process noted. No dehiscence. No active bleeding noted.  Negative for any significant edema noted to the surgical extremity.  Over all well-healing surgical site  Radiographic Exam 02/05/2022 LT ankle and foot:  Absence of the orthopedic hardware noted. No acute fracture or osseous abnormality  Assessment:  1. s/p removal of hardware left ankle. DOS: 01/30/2022   Plan of Care:  1. Patient was evaluated.  2.  Partial staples were removed today 3.  Continue weightbearing in the cam boot 4.  Return to clinic in 1 week for remaining staple removal  Edrick Kins, DPM [facility]  Dr. Edrick Kins, DPM    [address], [place] [address]                Office [phone]  Fax [phone]

## 2022-02-21 ENCOUNTER — Ambulatory Visit (INDEPENDENT_AMBULATORY_CARE_PROVIDER_SITE_OTHER): Payer: Medicare Other

## 2022-02-21 ENCOUNTER — Ambulatory Visit (INDEPENDENT_AMBULATORY_CARE_PROVIDER_SITE_OTHER): Payer: Medicare Other | Admitting: Podiatry

## 2022-02-21 DIAGNOSIS — Z9889 Other specified postprocedural states: Secondary | ICD-10-CM

## 2022-02-21 NOTE — Progress Notes (Signed)
Chief Complaint  Patient presents with   Post-op Follow-up    Patient is here for post op DOS 01/30/22 removal of staples.left ankle.    Subjective:  Patient presents today status post removal of hardware left ankle. DOS: 01/30/2022.  Patient doing well.  She presents today to have the remaining staples removed  Past Medical History:  Diagnosis Date   Allergy    Ankle wound 02/2016   bilateral   Dental bridge present    lower   Dental crowns present    upper right   Open wounds involving multiple regions of lower extremity    Peripheral vascular disease ([place])    venous stasis ulcers bilateral lower extremities   PONV (postoperative nausea and vomiting)     Past Surgical History:  Procedure Laterality Date   APPLICATION OF WOUND VAC Bilateral 12/12/2015   Procedure: APPLICATION OF WOUND VAC;  Surgeon: [name], DO;  Location: [place];  Service: Plastics;  Laterality: Bilateral;   DRESSING CHANGE UNDER ANESTHESIA Right 02/14/2016   Procedure: DRESSING CHANGE UNDER ANESTHESIA;  Surgeon: [name], DO;  Location: [place];  Service: Plastics;  Laterality: Right;   ECTOPIC PREGNANCY SURGERY  1996   I & D EXTREMITY Bilateral 12/12/2015   Procedure: DEBRIDEMENT OF BILATERAL LEG WOUNDS;  Surgeon: [name], DO;  Location: [place] [facility];  Service: Plastics;  Laterality: Bilateral;   I & D EXTREMITY Bilateral 01/07/2016   Procedure: IRRIGATION AND DEBRIDEMENT OF BILATERAL LEGS;  Surgeon: [name], DO;  Location: [facility];  Service: Plastics;  Laterality: Bilateral;   PERIPHERAL VASCULAR CATHETERIZATION N/A 10/15/2015   Procedure: Abdominal Aortogram w/Lower Extremity;  Surgeon: Angelia Mould, MD;  Location: [place] [facility];  Service: Cardiovascular;  Laterality: N/A;   RADIOLOGY WITH ANESTHESIA N/A 02/24/2019   Procedure: MIR CERVICAL WITH AND WITHOUT CONTRAST;  Surgeon:  Radiologist, Medication, MD;  Location: [place];  Service: Radiology;  Laterality: N/A;   skin graph     TONSILLECTOMY     as a child   VEIN LIGATION AND STRIPPING Left late 1990's   WOUND DEBRIDEMENT Left 02/14/2016   Procedure: SKIN GRAFT SPLIT THICKNESS TO LEFT ANKLE;  Surgeon: [name], DO;  Location: [facility];  Service: Plastics;  Laterality: Left;    Allergies  Allergen Reactions   Penicillins Hives and Itching    Did it involve swelling of the face/tongue/throat, SOB, or low BP? No Did it involve sudden or severe rash/hives, skin peeling, or any reaction on the inside of your mouth or nose? Yes Did you need to seek medical attention at a hospital or doctor's office? No When did it last happen?      10+ years If all above answers are "NO", may proceed with cephalosporin use.     Adhesive [Tape] Itching   Doxycycline Nausea And Vomiting   Latex Itching    Objective/Physical Exam Neurovascular status intact.  Skin incisions appear to be well coapted with  staples intact. No sign of infectious process noted. No dehiscence. No active bleeding noted.  Negative for any significant edema noted to the surgical extremity.  Over all well-healing surgical site  Radiographic Exam 02/21/2022 LT ankle and foot:  Absence of the orthopedic hardware noted.  No change since prior x-rays.  Degenerative changes with arthrodesis of the ankle and subtalar joint  Assessment: 1. s/p removal of hardware left ankle. DOS: 01/30/2022  Plan of Care:  1. Patient was evaluated.  2.  Remaining staples removed 3.  Continue Ace wrap for compression daily x4 weeks 4.  Patient may begin to transition out of the cam boot into regular tennis shoes 5.  Return to clinic 1 month for follow-up ankle x-ray and evaluation  Edrick Kins, DPM [facility]  Dr. Edrick Kins, DPM    [address], [place] [address]                 Office [phone]  Fax [phone]

## 2022-03-18 ENCOUNTER — Ambulatory Visit (INDEPENDENT_AMBULATORY_CARE_PROVIDER_SITE_OTHER): Payer: Medicare Other

## 2022-03-18 ENCOUNTER — Ambulatory Visit (INDEPENDENT_AMBULATORY_CARE_PROVIDER_SITE_OTHER): Payer: Medicare Other | Admitting: Podiatry

## 2022-03-18 DIAGNOSIS — Z9889 Other specified postprocedural states: Secondary | ICD-10-CM | POA: Diagnosis not present

## 2022-03-18 NOTE — Progress Notes (Signed)
Chief Complaint  Patient presents with   Follow-up    Patient is here for follow-up for left foot surgery    Subjective:  Patient presents today status post removal of hardware left ankle. DOS: 01/30/2022.  Patient doing well.  Patient has been walking and ambulating in tennis shoes with no complaints.  She says that she does have random occasional shooting sensations to the foot with some numbness.  Past Medical History:  Diagnosis Date   Allergy    Ankle wound 02/2016   bilateral   Dental bridge present    lower   Dental crowns present    upper right   Open wounds involving multiple regions of lower extremity    Peripheral vascular disease ([place])    venous stasis ulcers bilateral lower extremities   PONV (postoperative nausea and vomiting)     Past Surgical History:  Procedure Laterality Date   APPLICATION OF WOUND VAC Bilateral 12/12/2015   Procedure: APPLICATION OF WOUND VAC;  Surgeon: [name], DO;  Location: [place];  Service: Plastics;  Laterality: Bilateral;   DRESSING CHANGE UNDER ANESTHESIA Right 02/14/2016   Procedure: DRESSING CHANGE UNDER ANESTHESIA;  Surgeon: [name], DO;  Location: [place];  Service: Plastics;  Laterality: Right;   ECTOPIC PREGNANCY SURGERY  1996   I & D EXTREMITY Bilateral 12/12/2015   Procedure: DEBRIDEMENT OF BILATERAL LEG WOUNDS;  Surgeon: [name], DO;  Location: [place];  Service: Plastics;  Laterality: Bilateral;   I & D EXTREMITY Bilateral 01/07/2016   Procedure: IRRIGATION AND DEBRIDEMENT OF BILATERAL LEGS;  Surgeon: [name], DO;  Location: [facility];  Service: Plastics;  Laterality: Bilateral;   PERIPHERAL VASCULAR CATHETERIZATION N/A 10/15/2015   Procedure: Abdominal Aortogram w/Lower Extremity;  Surgeon: Angelia Mould, MD;  Location: [facility] [place] [facility];  Service: Cardiovascular;  Laterality: N/A;   RADIOLOGY WITH  ANESTHESIA N/A 02/24/2019   Procedure: MIR CERVICAL WITH AND WITHOUT CONTRAST;  Surgeon: Radiologist, Medication, MD;  Location: [facility];  Service: Radiology;  Laterality: N/A;   skin graph     TONSILLECTOMY     as a child   VEIN LIGATION AND STRIPPING Left late 1990's   WOUND DEBRIDEMENT Left 02/14/2016   Procedure: SKIN GRAFT SPLIT THICKNESS TO LEFT ANKLE;  Surgeon: [name], DO;  Location: [facility];  Service: Plastics;  Laterality: Left;    Allergies  Allergen Reactions   Penicillins Hives and Itching    Did it involve swelling of the face/tongue/throat, SOB, or low BP? No Did it involve sudden or severe rash/hives, skin peeling, or any reaction on the inside of your mouth or nose? Yes Did you need to seek medical attention at a hospital or doctor's office? No When did it last happen?      10+ years If all above answers are "NO", may proceed with cephalosporin use.     Adhesive [Tape] Itching   Doxycycline Nausea And Vomiting   Latex Itching    Objective/Physical Exam Neurovascular status intact.  Skin incisions nicely healed.  No sign of infectious process noted. No dehiscence.  Negative for any significant edema noted to the surgical extremity.  Over all well-healing surgical site  Radiographic Exam 03/18/2022 LT ankle and foot:  Absence of the orthopedic hardware noted.  No change since prior x-rays.  Degenerative changes with arthrodesis of the ankle and subtalar joint  Assessment: 1. s/p removal of  hardware left ankle. DOS: 01/30/2022   Plan of Care:  1. Patient was evaluated.  2.  Incision site nicely healed. 3.  Patient has no pain or tenderness associated to the foot.  Overall she is doing well.  She may resume good supportive tennis shoes daily 4.  Patient uses a cane for assistance.  Continue 5.  Return to clinic as needed  Edrick Kins, DPM [facility]  Dr. Edrick Kins, DPM    [address], [place] [address]                Office [phone]  Fax [phone]

## 2022-03-25 ENCOUNTER — Ambulatory Visit: Payer: Medicare Other | Admitting: Podiatry

## 2022-08-04 ENCOUNTER — Other Ambulatory Visit: Payer: Self-pay | Admitting: Family Medicine

## 2022-08-04 ENCOUNTER — Ambulatory Visit
Admission: RE | Admit: 2022-08-04 | Discharge: 2022-08-04 | Disposition: A | Discharge status: Home / Self Care | Payer: Medicare Other | Source: Ambulatory Visit | Attending: Family Medicine | Admitting: Family Medicine

## 2022-08-04 DIAGNOSIS — R634 Abnormal weight loss: Secondary | ICD-10-CM

## 2022-09-24 ENCOUNTER — Other Ambulatory Visit: Payer: Self-pay

## 2023-04-08 DIAGNOSIS — H5203 Hypermetropia, bilateral: Secondary | ICD-10-CM | POA: Diagnosis not present

## 2023-04-08 DIAGNOSIS — H52223 Regular astigmatism, bilateral: Secondary | ICD-10-CM | POA: Diagnosis not present

## 2023-04-08 DIAGNOSIS — Z135 Encounter for screening for eye and ear disorders: Secondary | ICD-10-CM | POA: Diagnosis not present

## 2023-04-08 DIAGNOSIS — H2513 Age-related nuclear cataract, bilateral: Secondary | ICD-10-CM | POA: Diagnosis not present

## 2023-04-08 DIAGNOSIS — H524 Presbyopia: Secondary | ICD-10-CM | POA: Diagnosis not present

## 2023-07-16 DIAGNOSIS — N6331 Unspecified lump in axillary tail of the right breast: Secondary | ICD-10-CM | POA: Diagnosis not present

## 2023-07-16 DIAGNOSIS — D241 Benign neoplasm of right breast: Secondary | ICD-10-CM | POA: Diagnosis not present

## 2023-07-16 DIAGNOSIS — D360 Benign neoplasm of lymph nodes: Secondary | ICD-10-CM | POA: Diagnosis not present

## 2023-07-16 DIAGNOSIS — N6311 Unspecified lump in the right breast, upper outer quadrant: Secondary | ICD-10-CM | POA: Diagnosis not present

## 2023-08-13 ENCOUNTER — Other Ambulatory Visit: Payer: Self-pay | Admitting: General Surgery

## 2023-08-13 DIAGNOSIS — N6489 Other specified disorders of breast: Secondary | ICD-10-CM

## 2023-09-09 DIAGNOSIS — M810 Age-related osteoporosis without current pathological fracture: Secondary | ICD-10-CM | POA: Diagnosis not present

## 2023-09-14 ENCOUNTER — Other Ambulatory Visit: Payer: Self-pay

## 2023-09-14 ENCOUNTER — Encounter (HOSPITAL_BASED_OUTPATIENT_CLINIC_OR_DEPARTMENT_OTHER): Payer: Self-pay | Admitting: General Surgery

## 2023-09-21 DIAGNOSIS — N6311 Unspecified lump in the right breast, upper outer quadrant: Secondary | ICD-10-CM | POA: Diagnosis not present

## 2023-09-21 DIAGNOSIS — N6321 Unspecified lump in the left breast, upper outer quadrant: Secondary | ICD-10-CM | POA: Diagnosis not present

## 2023-09-21 MED ORDER — CHLORHEXIDINE GLUCONATE CLOTH 2 % EX PADS: 6.0000 | MEDICATED_PAD | Freq: Once | CUTANEOUS | Status: DC

## 2023-09-21 MED ORDER — ENSURE PRE-SURGERY PO LIQD: 296.0000 mL | Freq: Once | ORAL | Status: DC

## 2023-09-21 NOTE — Anesthesia Preprocedure Evaluation (Signed)
 Anesthesia Evaluation  Patient identified by MRN, date of birth, ID band Patient awake    Reviewed: Allergy & Precautions, NPO status , Patient's Chart, lab work & pertinent test results  History of Anesthesia Complications Negative for: history of anesthetic complications  Airway Mallampati: II  TM Distance: >3 FB Neck ROM: Full    Dental  (+) Dental Advisory Given   Pulmonary neg shortness of breath, neg sleep apnea, neg COPD, neg recent URI, former smoker   Pulmonary exam normal breath sounds clear to auscultation       Cardiovascular (-) hypertension(-) angina + Peripheral Vascular Disease  (-) Past MI, (-) Cardiac Stents and (-) CABG (-) dysrhythmias  Rhythm:Regular Rate:Normal     Neuro/Psych neg Seizures PSYCHIATRIC DISORDERS Anxiety     Chronic pain syndrome    GI/Hepatic negative GI ROS, Neg liver ROS,,,  Endo/Other  negative endocrine ROS    Renal/GU negative Renal ROS     Musculoskeletal   Abdominal   Peds  Hematology negative hematology ROS (+)   Anesthesia Other Findings   Reproductive/Obstetrics                             Anesthesia Physical Anesthesia Plan  ASA: 2  Anesthesia Plan: General   Post-op Pain Management: Tylenol  PO (pre-op)*   Induction: Intravenous  PONV Risk Score and Plan: 3 and Ondansetron  and Dexamethasone   Airway Management Planned: LMA  Additional Equipment:   Intra-op Plan:   Post-operative Plan: Extubation in OR  Informed Consent: I have reviewed the patients History and Physical, chart, labs and discussed the procedure including the risks, benefits and alternatives for the proposed anesthesia with the patient or authorized representative who has indicated his/her understanding and acceptance.     Dental advisory given  Plan Discussed with: CRNA and Anesthesiologist  Anesthesia Plan Comments: (Risks of general anesthesia discussed  including, but not limited to, sore throat, hoarse voice, chipped/damaged teeth, injury to vocal cords, nausea and vomiting, allergic reactions, lung infection, heart attack, stroke, and death. All questions answered. )        Anesthesia Quick Evaluation

## 2023-09-21 NOTE — Progress Notes (Signed)
 Surgical soap given with instructions, pt verbalized understanding. Enhanced Recovery after Surgery  ?Enhanced Recovery after Surgery is a protocol used to improve the stress on your body and your recovery after surgery. ? ?Patient Instructions ? ?The night before surgery:  ?No food after midnight. ONLY clear liquids after midnight ? ?The day of surgery (if you do NOT have diabetes):  ?Drink ONE (1) Pre-Surgery Clear Ensure as directed.   ?This drink was given to you during your hospital  ?pre-op appointment visit. ?The pre-op nurse will instruct you on the time to drink the  ?Pre-Surgery Ensure depending on your surgery time. ?Finish the drink at the designated time by the pre-op nurse.  ?Nothing else to drink after completing the  ?Pre-Surgery Clear Ensure. ? ?The day of surgery (if you have diabetes): ?Drink ONE (1) Gatorade 2 (G2) as directed. ?This drink was given to you during your hospital  ?pre-op appointment visit.  ?The pre-op nurse will instruct you on the time to drink the  ? Gatorade 2 (G2) depending on your surgery time. ?Color of the Gatorade may vary. Red is not allowed. ?Nothing else to drink after completing the  ?Gatorade 2 (G2). ? ?       If office.you have questions, please contact your surgeon?s office  ?

## 2023-09-22 ENCOUNTER — Other Ambulatory Visit: Payer: Self-pay

## 2023-09-22 ENCOUNTER — Encounter (HOSPITAL_BASED_OUTPATIENT_CLINIC_OR_DEPARTMENT_OTHER)
Admission: RE | Disposition: A | Discharge status: Home / Self Care | Payer: Self-pay | Source: Home / Self Care | Attending: General Surgery

## 2023-09-22 ENCOUNTER — Ambulatory Visit (HOSPITAL_BASED_OUTPATIENT_CLINIC_OR_DEPARTMENT_OTHER): Admitting: Anesthesiology

## 2023-09-22 ENCOUNTER — Encounter (HOSPITAL_BASED_OUTPATIENT_CLINIC_OR_DEPARTMENT_OTHER): Payer: Self-pay | Admitting: General Surgery

## 2023-09-22 ENCOUNTER — Ambulatory Visit (HOSPITAL_BASED_OUTPATIENT_CLINIC_OR_DEPARTMENT_OTHER)
Admission: RE | Admit: 2023-09-22 | Discharge: 2023-09-22 | Disposition: A | Discharge status: Home / Self Care | Attending: General Surgery | Admitting: General Surgery

## 2023-09-22 DIAGNOSIS — N6489 Other specified disorders of breast: Secondary | ICD-10-CM | POA: Insufficient documentation

## 2023-09-22 DIAGNOSIS — N6031 Fibrosclerosis of right breast: Secondary | ICD-10-CM | POA: Diagnosis not present

## 2023-09-22 DIAGNOSIS — N6012 Diffuse cystic mastopathy of left breast: Secondary | ICD-10-CM | POA: Diagnosis not present

## 2023-09-22 DIAGNOSIS — R920 Mammographic microcalcification found on diagnostic imaging of breast: Secondary | ICD-10-CM | POA: Diagnosis not present

## 2023-09-22 DIAGNOSIS — Z87891 Personal history of nicotine dependence: Secondary | ICD-10-CM | POA: Insufficient documentation

## 2023-09-22 DIAGNOSIS — N6082 Other benign mammary dysplasias of left breast: Secondary | ICD-10-CM | POA: Diagnosis not present

## 2023-09-22 DIAGNOSIS — R928 Other abnormal and inconclusive findings on diagnostic imaging of breast: Secondary | ICD-10-CM

## 2023-09-22 DIAGNOSIS — N6081 Other benign mammary dysplasias of right breast: Secondary | ICD-10-CM | POA: Diagnosis not present

## 2023-09-22 DIAGNOSIS — Z01818 Encounter for other preprocedural examination: Secondary | ICD-10-CM

## 2023-09-22 DIAGNOSIS — N6091 Unspecified benign mammary dysplasia of right breast: Secondary | ICD-10-CM | POA: Diagnosis not present

## 2023-09-22 DIAGNOSIS — N6022 Fibroadenosis of left breast: Secondary | ICD-10-CM | POA: Diagnosis not present

## 2023-09-22 DIAGNOSIS — L905 Scar conditions and fibrosis of skin: Secondary | ICD-10-CM | POA: Diagnosis not present

## 2023-09-22 DIAGNOSIS — N6021 Fibroadenosis of right breast: Secondary | ICD-10-CM | POA: Diagnosis not present

## 2023-09-22 DIAGNOSIS — N6092 Unspecified benign mammary dysplasia of left breast: Secondary | ICD-10-CM | POA: Diagnosis not present

## 2023-09-22 HISTORY — PX: RADIOACTIVE SEED GUIDED EXCISIONAL BREAST BIOPSY: SHX6490

## 2023-09-22 SURGERY — BIOPSY, BREAST WITH MAGNETIC MARKER LOCALIZATION
Anesthesia: General | Site: Breast | Laterality: Bilateral

## 2023-09-22 MED ORDER — EPHEDRINE SULFATE (PRESSORS) 50 MG/ML IJ SOLN
INTRAMUSCULAR | Status: DC | PRN
  Administered 2023-09-22 (×2): 5 mg via INTRAVENOUS

## 2023-09-22 MED ORDER — ACETAMINOPHEN 500 MG PO TABS
ORAL_TABLET | ORAL | Status: AC
  Filled 2023-09-22: qty 2

## 2023-09-22 MED ORDER — ONDANSETRON HCL 4 MG/2ML IJ SOLN
INTRAMUSCULAR | Status: DC | PRN
  Administered 2023-09-22: 4 mg via INTRAVENOUS

## 2023-09-22 MED ORDER — MIDAZOLAM HCL 2 MG/2ML IJ SOLN
INTRAMUSCULAR | Status: AC
  Filled 2023-09-22: qty 2

## 2023-09-22 MED ORDER — LIDOCAINE HCL (CARDIAC) PF 100 MG/5ML IV SOSY
PREFILLED_SYRINGE | INTRAVENOUS | Status: DC | PRN
  Administered 2023-09-22: 60 mg via INTRAVENOUS

## 2023-09-22 MED ORDER — CIPROFLOXACIN IN D5W 400 MG/200ML IV SOLN
INTRAVENOUS | Status: AC
  Filled 2023-09-22: qty 200

## 2023-09-22 MED ORDER — BUPIVACAINE HCL (PF) 0.25 % IJ SOLN
INTRAMUSCULAR | Status: DC | PRN
  Administered 2023-09-22: 20 mL

## 2023-09-22 MED ORDER — FENTANYL CITRATE (PF) 100 MCG/2ML IJ SOLN
INTRAMUSCULAR | Status: AC
  Filled 2023-09-22: qty 2

## 2023-09-22 MED ORDER — OXYCODONE HCL 5 MG PO TABS
ORAL_TABLET | ORAL | Status: AC
  Filled 2023-09-22: qty 1

## 2023-09-22 MED ORDER — OXYCODONE HCL 5 MG/5ML PO SOLN: 5.0000 mg | Freq: Once | ORAL | Status: AC | PRN

## 2023-09-22 MED ORDER — AMISULPRIDE (ANTIEMETIC) 5 MG/2ML IV SOLN: 10.0000 mg | Freq: Once | INTRAVENOUS | Status: DC | PRN

## 2023-09-22 MED ORDER — LACTATED RINGERS IV SOLN: INTRAVENOUS | Status: DC

## 2023-09-22 MED ORDER — CIPROFLOXACIN IN D5W 400 MG/200ML IV SOLN
400.0000 mg | INTRAVENOUS | Status: AC
  Administered 2023-09-22: 400 mg via INTRAVENOUS

## 2023-09-22 MED ORDER — MIDAZOLAM HCL 2 MG/2ML IJ SOLN
INTRAMUSCULAR | Status: DC | PRN
  Administered 2023-09-22: 1 mg via INTRAVENOUS

## 2023-09-22 MED ORDER — PROPOFOL 10 MG/ML IV BOLUS
INTRAVENOUS | Status: DC | PRN
  Administered 2023-09-22: 150 mg via INTRAVENOUS

## 2023-09-22 MED ORDER — ACETAMINOPHEN 500 MG PO TABS: 1000.0000 mg | ORAL_TABLET | ORAL | Status: AC

## 2023-09-22 MED ORDER — DEXAMETHASONE SODIUM PHOSPHATE 4 MG/ML IJ SOLN
INTRAMUSCULAR | Status: DC | PRN
  Administered 2023-09-22: 8 mg via INTRAVENOUS

## 2023-09-22 MED ORDER — BUPIVACAINE HCL (PF) 0.25 % IJ SOLN
INTRAMUSCULAR | Status: AC
  Filled 2023-09-22: qty 30

## 2023-09-22 MED ORDER — OXYCODONE HCL 5 MG PO TABS
5.0000 mg | ORAL_TABLET | Freq: Once | ORAL | Status: AC | PRN
  Administered 2023-09-22: 5 mg via ORAL

## 2023-09-22 MED ORDER — ACETAMINOPHEN 500 MG PO TABS
1000.0000 mg | ORAL_TABLET | Freq: Once | ORAL | Status: AC
  Administered 2023-09-22: 1000 mg via ORAL

## 2023-09-22 MED ORDER — FENTANYL CITRATE (PF) 100 MCG/2ML IJ SOLN
25.0000 ug | INTRAMUSCULAR | Status: DC | PRN
  Administered 2023-09-22: 50 ug via INTRAVENOUS

## 2023-09-22 MED ORDER — FENTANYL CITRATE (PF) 100 MCG/2ML IJ SOLN
INTRAMUSCULAR | Status: DC | PRN
  Administered 2023-09-22: 50 ug via INTRAVENOUS

## 2023-09-22 SURGICAL SUPPLY — 49 items
BINDER BREAST LRG (GAUZE/BANDAGES/DRESSINGS) IMPLANT
BINDER BREAST MEDIUM (GAUZE/BANDAGES/DRESSINGS) IMPLANT
BINDER BREAST XLRG (GAUZE/BANDAGES/DRESSINGS) IMPLANT
BINDER BREAST XXLRG (GAUZE/BANDAGES/DRESSINGS) IMPLANT
BLADE SURG 15 STRL LF DISP TIS (BLADE) ×2 IMPLANT
CANISTER SUC SOCK COL 7IN (MISCELLANEOUS) IMPLANT
CANISTER SUCT 1200ML W/VALVE (MISCELLANEOUS) IMPLANT
CHLORAPREP W/TINT 26 (MISCELLANEOUS) ×2 IMPLANT
CLIP APPLIE 9.375 MED OPEN (MISCELLANEOUS) IMPLANT
CLIP TI WIDE RED SMALL 6 (CLIP) IMPLANT
COVER BACK TABLE 60X90IN (DRAPES) ×2 IMPLANT
COVER MAYO STAND STRL (DRAPES) ×2 IMPLANT
COVER PROBE CYLINDRICAL 5X96 (MISCELLANEOUS) ×2 IMPLANT
DERMABOND ADVANCED .7 DNX12 (GAUZE/BANDAGES/DRESSINGS) ×2 IMPLANT
DRAPE LAPAROSCOPIC ABDOMINAL (DRAPES) ×2 IMPLANT
DRAPE UTILITY XL STRL (DRAPES) ×2 IMPLANT
DRSG TEGADERM 4X4.75 (GAUZE/BANDAGES/DRESSINGS) IMPLANT
ELECT COATED BLADE 2.86 ST (ELECTRODE) ×2 IMPLANT
ELECTRODE REM PT RTRN 9FT ADLT (ELECTROSURGICAL) ×2 IMPLANT
GAUZE SPONGE 4X4 12PLY STRL LF (GAUZE/BANDAGES/DRESSINGS) IMPLANT
GLOVE BIO SURGEON STRL SZ7 (GLOVE) ×2 IMPLANT
GLOVE BIOGEL PI IND STRL 7.0 (GLOVE) ×2 IMPLANT
GLOVE BIOGEL PI IND STRL 7.5 (GLOVE) ×2 IMPLANT
GLOVE SURG SS PI 7.0 STRL IVOR (GLOVE) ×1 IMPLANT
GLOVE SURG SYN 7.0 (GLOVE) ×2 IMPLANT
GLOVE SURG SYN 7.0 PF PI (GLOVE) IMPLANT
GOWN STRL REUS W/ TWL LRG LVL3 (GOWN DISPOSABLE) ×4 IMPLANT
HEMOSTAT ARISTA ABSORB 3G PWDR (HEMOSTASIS) IMPLANT
KIT MARKER MARGIN INK (KITS) ×2 IMPLANT
NDL HYPO 25X1 1.5 SAFETY (NEEDLE) ×1 IMPLANT
NEEDLE HYPO 25X1 1.5 SAFETY (NEEDLE) ×2 IMPLANT
NS IRRIG 1000ML POUR BTL (IV SOLUTION) IMPLANT
PACK BASIN DAY SURGERY FS (CUSTOM PROCEDURE TRAY) ×2 IMPLANT
PENCIL SMOKE EVACUATOR (MISCELLANEOUS) ×2 IMPLANT
RETRACTOR ONETRAX LX 90X20 (MISCELLANEOUS) IMPLANT
SLEEVE SCD COMPRESS KNEE MED (STOCKING) ×2 IMPLANT
SPIKE FLUID TRANSFER (MISCELLANEOUS) IMPLANT
SPONGE T-LAP 4X18 ~~LOC~~+RFID (SPONGE) ×2 IMPLANT
STRIP CLOSURE SKIN 1/2X4 (GAUZE/BANDAGES/DRESSINGS) ×2 IMPLANT
SUT MNCRL AB 4-0 PS2 18 (SUTURE) IMPLANT
SUT MON AB 5-0 PS2 18 (SUTURE) IMPLANT
SUT SILK 2 0 SH (SUTURE) IMPLANT
SUT VIC AB 2-0 SH 27XBRD (SUTURE) ×3 IMPLANT
SUT VIC AB 3-0 SH 27X BRD (SUTURE) ×3 IMPLANT
SYR CONTROL 10ML LL (SYRINGE) ×2 IMPLANT
TOWEL GREEN STERILE FF (TOWEL DISPOSABLE) ×2 IMPLANT
TRAY FAXITRON CT DISP (TRAY / TRAY PROCEDURE) ×2 IMPLANT
TUBE CONNECTING 20X1/4 (TUBING) ×1 IMPLANT
YANKAUER SUCT BULB TIP NO VENT (SUCTIONS) ×1 IMPLANT

## 2023-09-22 NOTE — Anesthesia Procedure Notes (Signed)
 Procedure Name: LMA Insertion Date/Time: 09/22/2023 7:37 AM  Performed by: Lonia Ro, CRNAPre-anesthesia Checklist: Patient identified, Emergency Drugs available, Suction available, Patient being monitored and Timeout performed Patient Re-evaluated:Patient Re-evaluated prior to induction Oxygen Delivery Method: Circle system utilized Preoxygenation: Pre-oxygenation with 100% oxygen Induction Type: IV induction Ventilation: Mask ventilation without difficulty LMA: LMA inserted LMA Size: 4.0 Number of attempts: 1 Placement Confirmation: positive ETCO2 and breath sounds checked- equal and bilateral Tube secured with: Tape Dental Injury: Teeth and Oropharynx as per pre-operative assessment

## 2023-09-22 NOTE — Discharge Instructions (Addendum)
 [facility] Office Phone Number [phone]  POST OP INSTRUCTIONS Take 400 mg of ibuprofen  every 8 hours or 650 mg tylenol  every 6 hours for next 72 hours then as needed. Use ice several times daily also.  A prescription for pain medication may be given to you upon discharge.  Take your pain medication as prescribed, if needed.  If narcotic pain medicine is not needed, then you may take acetaminophen  (Tylenol ), naprosyn (Alleve) or ibuprofen  (Advil ) as needed. Take your usually prescribed medications unless otherwise directed If you need a refill on your pain medication, please contact your pharmacy.  They will contact our office to request authorization.  Prescriptions will not be filled after 5pm or on week-ends. You should eat very light the first 24 hours after surgery, such as soup, crackers, pudding, etc.  Resume your normal diet the day after surgery. Most patients will experience some swelling and bruising in the breast.  Ice packs and a good support bra will help.  Wear the breast binder provided or a sports bra for 72 hours day and night.  After that wear a sports bra during the day until you return to the office. Swelling and bruising can take several days to resolve.  It is common to experience some constipation if taking pain medication after surgery.  Increasing fluid intake and taking a stool softener will usually help or prevent this problem from occurring.  A mild laxative (Milk of Magnesia or Miralax) should be taken according to package directions if there are no bowel movements after 48 hours. I used skin glue on the incision, you may shower in 24 hours.  The glue will flake off over the next 2-3 weeks.  Any sutures or staples will be removed at the office during your follow-up visit. ACTIVITIES:  You may resume regular daily activities (gradually increasing) beginning the next day.  Wearing a good support bra or sports bra minimizes pain and swelling.  You may have  sexual intercourse when it is comfortable. You may drive when you no longer are taking prescription pain medication, you can comfortably wear a seatbelt, and you can safely maneuver your car and apply brakes. RETURN TO WORK:  ______________________________________________________________________________________ [name] should see your doctor in the office for a follow-up appointment approximately two weeks after your surgery.  Your doctor's nurse will typically make your follow-up appointment when she calls you with your pathology report.  Expect your pathology report 3-4 business days after your surgery.  You may call to check if you do not hear from us  after three days. OTHER INSTRUCTIONS: _______________________________________________________________________________________________ _____________________________________________________________________________________________________________________________________ _____________________________________________________________________________________________________________________________________ _____________________________________________________________________________________________________________________________________  WHEN TO CALL DR WAKEFIELD: Fever over 101.0 Nausea and/or vomiting. Extreme swelling or bruising. Continued bleeding from incision. Increased pain, redness, or drainage from the incision.  The clinic staff is available to answer your questions during regular business hours.  Please don't hesitate to call and ask to speak to one of the nurses for clinical concerns.  If you have a medical emergency, go to the nearest emergency room or call 911.  A surgeon from [facility] is always on call at the hospital.  For further questions, please visit [email or web address]   Post Anesthesia Home Care Instructions  Activity: Get plenty of rest for the remainder of the day. A responsible individual must stay  with you for 24 hours following the procedure.  For the next 24 hours, DO NOT: -Drive a car -Advertising copywriter -Drink alcoholic beverages -Take any medication unless instructed by  your physician -Make any legal decisions or sign important papers.  Meals: Start with liquid foods such as gelatin or soup. Progress to regular foods as tolerated. Avoid greasy, spicy, heavy foods. If nausea and/or vomiting occur, drink only clear liquids until the nausea and/or vomiting subsides. Call your physician if vomiting continues.  Special Instructions/Symptoms: Your throat may feel dry or sore from the anesthesia or the breathing tube placed in your throat during surgery. If this causes discomfort, gargle with warm salt water. The discomfort should disappear within 24 hours.   Tylenol  can be taken after 12:37 pm if needed

## 2023-09-22 NOTE — H&P (Signed)
  68 year old female who was seen last year after screening mammogram found bilateral lesions. 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 She elected to undergo observation for this at that point. She had repeat imaging. The biopsy proven papilloma is less conspicuous. The biopsy-proven benign reactive lymph node is not significantly changed and correlates with the previous biopsy. There is another area that looks like a benign lymph node. 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 She is referred now for evaluation for excision again after these repeat images  Medical History: History reviewed. No pertinent past medical history.  There is no problem list on file for this patient.  Past Surgical History:  Procedure Laterality Date  ankle surgery 2021  remove plate 1610  TONSILLECTOMY   Allergies  Allergen Reactions  Penicillins Hives, Itching and Rash  Did it involve swelling of the face/tongue/throat, SOB, or low BP? No Did it involve sudden or severe rash/hives, skin peeling, or any reaction on the inside of your mouth or nose? Yes Did you need to seek medical attention at a hospital or doctor's office? No When did it last happen? 10+ years If all above answers are "NO", may proceed with cephalosporin use.   Current Outpatient Medications on File Prior to Visit  Medication Sig Dispense Refill  acetaminophen  (TYLENOL ) 500 MG tablet Take 1,000 mg by mouth once daily   History reviewed. No pertinent family history.   Social History   Tobacco Use  Smoking Status Former  Types: Cigarettes  Start date: 2008  Smokeless Tobacco Never  Marital status: Married  Tobacco Use  Smoking status: Former  Types: Cigarettes  Start date: 2008  Smokeless tobacco: Never  Substance and Sexual Activity   Alcohol use: Yes  Alcohol/week: 3.0 - 4.0 standard drinks of alcohol  Types: 3 - 4 Cans of beer per week  Drug use: Never   Objective:   Weight: 60.1 kg (132 lb 6.4 oz)   Body mass index is 20.74 kg/m.  Physical Exam Vitals reviewed.  Constitutional:  Appearance: Normal appearance.  Chest:  Breasts: Right: No inverted nipple, mass or nipple discharge.  Left: No inverted nipple, mass or nipple discharge.  Lymphadenopathy:  Upper Body:  Right upper body: No supraclavicular or axillary adenopathy.  Left upper body: No supraclavicular or axillary adenopathy.  Neurological:  Mental Status: She is alert.    Assessment and Plan:   Radial scar of breast  Bilateral seed guided excisional biopsies  These areas were being followed. They really are unchanged but I think the CSL with atypia should certainly be excised. It would make sense to remove the other 1 at the same time as opposed to following it. We are going to do seed guided excisional biopsies of both of these areas due to the small risk of upgrade with excision. We discussed risks and recovery and she is agreeable to proceed in about a month when she requested scheduling

## 2023-09-22 NOTE — Interval H&P Note (Signed)
 History and Physical Interval Note:  09/22/2023 7:15 AM  [name]  has presented today for surgery, with the diagnosis of BILATERAL RADIAL SCARS.  The various methods of treatment have been discussed with the patient and family. After consideration of risks, benefits and other options for treatment, the patient has consented to  Procedure(s) with comments: EXCISION, MASS, BREAST, USING RADIOLOGICAL MARKER (Bilateral) - BILATERAL BREAST SEED GUIDED EXCISIONAL BIOPSY as a surgical intervention.  The patient's history has been reviewed, patient examined, no change in status, stable for surgery.  I have reviewed the patient's chart and labs.  Questions were answered to the patient's satisfaction.     Enid Harry

## 2023-09-22 NOTE — Anesthesia Postprocedure Evaluation (Signed)
 Anesthesia Post Note  Patient: [name]  Procedure(s) Performed: EXCISION, MASS, BREAST, USING RADIOLOGICAL MARKER (Bilateral: Breast)     Patient location during evaluation: PACU Anesthesia Type: General Level of consciousness: awake Pain management: pain level controlled Vital Signs Assessment: post-procedure vital signs reviewed and stable Respiratory status: spontaneous breathing, nonlabored ventilation and respiratory function stable Cardiovascular status: blood pressure returned to baseline and stable Postop Assessment: no apparent nausea or vomiting Anesthetic complications: no   No notable events documented.  Last Vitals:  Vitals:   09/22/23 0845 09/22/23 0900  BP: (!) 142/59 (!) 133/56  Pulse: 62 (!) 57  Resp: 13   Temp:    SpO2: 100% 96%    Last Pain:  Vitals:   09/22/23 0900  TempSrc:   PainSc: 2                  Conard Decent

## 2023-09-22 NOTE — Transfer of Care (Signed)
 Immediate Anesthesia Transfer of Care Note  Patient: [name]  Procedure(s) Performed: EXCISION, MASS, BREAST, USING RADIOLOGICAL MARKER (Bilateral: Breast)  Patient Location: PACU  Anesthesia Type:General  Level of Consciousness: awake, alert , and patient cooperative  Airway & Oxygen Therapy: Patient Spontanous Breathing and Patient connected to nasal cannula oxygen  Post-op Assessment: Report given to RN and Post -op Vital signs reviewed and stable  Post vital signs: Reviewed and stable  Last Vitals:  Vitals Value Taken Time  BP    Temp    Pulse    Resp    SpO2      Last Pain:  Vitals:   09/22/23 0633  TempSrc: Temporal  PainSc: 0-No pain      Patients Stated Pain Goal: 3 (09/22/23 1610)  Complications: No notable events documented.

## 2023-09-22 NOTE — Op Note (Signed)
 Preoperative diagnosis: Bilateral mammographic lesions Postoperative diagnosis: Same as above Procedure: 1.  Left breast Magseed guided excisional biopsy 2.  Right breast Magseed guided excisional biopsy Surgeon: Dr. Donavan Fuchs Anesthesia: General Complications: None Estimated blood loss: Minimal Drains: None Specimens: 1.  Left breast tissue marked with paint containing Magseed and clip 2.  Right breast tissue marked with paint containing Magseed and clip Sponge and needle count was correct at completion Disposition to recovery in stable condition  Indications:68 year old female who was seen last year after screening mammogram found bilateral lesions. 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 She elected to undergo observation for this at that point. She had repeat imaging. The biopsy proven papilloma is less conspicuous. The biopsy-proven benign reactive lymph node is not significantly changed and correlates with the previous biopsy. There is another area that looks like a benign lymph node. 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 She is referred now for evaluation for excision again after these repeat images we discussed a bilateral excisional biopsy.  Procedure: After informed consent was obtained she first had Magseed's placed.  I had these mammograms available in the operating room.  She was given antibiotics.  SCDs were in place.  She was placed under general anesthesia without complication.  She was prepped and draped in the standard sterile surgical fashion.  A surgical timeout was then performed.  I first did the left side.  Identified where the Magseed was in the central breast.  I made a periareolar incision in order to hide the scar on this side.  I then used the probe to remove the  [facility] as well as some of the surrounding tissue.  Mammogram confirmed removal of the Magseed and the clip.  I then obtained hemostasis.  I closed the breast tissue with 2-0 Vicryl.  The skin was closed with 3-0 Vicryl and 5-0 Monocryl.  Glue and a Steri-Strip were eventually placed.  I then on the right side made a curvilinear incision overlying the seed.  It was very close to the skin on this side.  I then used the probe to remove the [facility] as well as some of the surrounding tissue.  Mammogram again confirmed removal of the [facility] and the clip.  I then obtained hemostasis.  I closed the breast tissue with 2-0 Vicryl and the skin was closed with 3-0 Vicryl and 4-0 Monocryl.  Glue and a Steri-Strip were placed as well.  She tolerated this well and was transferred to recovery stable.

## 2023-09-23 ENCOUNTER — Encounter (HOSPITAL_BASED_OUTPATIENT_CLINIC_OR_DEPARTMENT_OTHER): Payer: Self-pay | Admitting: General Surgery

## 2023-09-24 LAB — SURGICAL PATHOLOGY

## 2023-11-11 DIAGNOSIS — Z13220 Encounter for screening for lipoid disorders: Secondary | ICD-10-CM | POA: Diagnosis not present

## 2023-11-11 DIAGNOSIS — Z0000 Encounter for general adult medical examination without abnormal findings: Secondary | ICD-10-CM | POA: Diagnosis not present

## 2023-11-11 DIAGNOSIS — N63 Unspecified lump in unspecified breast: Secondary | ICD-10-CM | POA: Diagnosis not present

## 2023-11-11 DIAGNOSIS — Z136 Encounter for screening for cardiovascular disorders: Secondary | ICD-10-CM | POA: Diagnosis not present

## 2023-11-11 DIAGNOSIS — Z0189 Encounter for other specified special examinations: Secondary | ICD-10-CM | POA: Diagnosis not present

## 2023-11-11 DIAGNOSIS — M810 Age-related osteoporosis without current pathological fracture: Secondary | ICD-10-CM | POA: Diagnosis not present

## 2023-11-11 DIAGNOSIS — Z130 Encounter for screening for diseases of the blood and blood-forming organs and certain disorders involving the immune mechanism: Secondary | ICD-10-CM | POA: Diagnosis not present

## 2023-11-26 ENCOUNTER — Encounter (HOSPITAL_BASED_OUTPATIENT_CLINIC_OR_DEPARTMENT_OTHER): Payer: Self-pay | Admitting: General Surgery

## 2023-11-26 NOTE — OR Nursing (Signed)
 Late entry: Due to an error with the procedure names related to the radioactive seeds and magnetic markers, this OR record had the incorrect procedure documented. I reviewed the Operative note dictated by the surgeon and corrected the procedure to match what occurred that day. Berwyn Eagles, RN.

## 2024-03-21 DIAGNOSIS — Z23 Encounter for immunization: Secondary | ICD-10-CM | POA: Diagnosis not present
